# Patient Record
Sex: Male | Born: 1945
Health system: Southern US, Community
[De-identification: ages and names within clinical notes are randomized; demographics above are authoritative.]

## PROBLEM LIST (undated history)

## (undated) DIAGNOSIS — Z8701 Personal history of pneumonia (recurrent): Secondary | ICD-10-CM

## (undated) DIAGNOSIS — Z9889 Other specified postprocedural states: Secondary | ICD-10-CM

## (undated) DIAGNOSIS — D689 Coagulation defect, unspecified: Secondary | ICD-10-CM

## (undated) DIAGNOSIS — R51 Headache: Secondary | ICD-10-CM

## (undated) DIAGNOSIS — E785 Hyperlipidemia, unspecified: Secondary | ICD-10-CM

## (undated) DIAGNOSIS — N400 Enlarged prostate without lower urinary tract symptoms: Secondary | ICD-10-CM

## (undated) DIAGNOSIS — R233 Spontaneous ecchymoses: Secondary | ICD-10-CM

## (undated) DIAGNOSIS — M2540 Effusion, unspecified joint: Secondary | ICD-10-CM

## (undated) DIAGNOSIS — E790 Hyperuricemia without signs of inflammatory arthritis and tophaceous disease: Secondary | ICD-10-CM

## (undated) DIAGNOSIS — IMO0002 Reserved for concepts with insufficient information to code with codable children: Secondary | ICD-10-CM

## (undated) DIAGNOSIS — I480 Paroxysmal atrial fibrillation: Secondary | ICD-10-CM

## (undated) DIAGNOSIS — K219 Gastro-esophageal reflux disease without esophagitis: Secondary | ICD-10-CM

## (undated) DIAGNOSIS — I509 Heart failure, unspecified: Secondary | ICD-10-CM

## (undated) DIAGNOSIS — M2550 Pain in unspecified joint: Secondary | ICD-10-CM

## (undated) DIAGNOSIS — J439 Emphysema, unspecified: Secondary | ICD-10-CM

## (undated) DIAGNOSIS — Z77098 Contact with and (suspected) exposure to other hazardous, chiefly nonmedicinal, chemicals: Secondary | ICD-10-CM

## (undated) DIAGNOSIS — M810 Age-related osteoporosis without current pathological fracture: Secondary | ICD-10-CM

## (undated) DIAGNOSIS — J189 Pneumonia, unspecified organism: Secondary | ICD-10-CM

## (undated) DIAGNOSIS — I4891 Unspecified atrial fibrillation: Secondary | ICD-10-CM

## (undated) DIAGNOSIS — G629 Polyneuropathy, unspecified: Secondary | ICD-10-CM

## (undated) DIAGNOSIS — E079 Disorder of thyroid, unspecified: Secondary | ICD-10-CM

## (undated) DIAGNOSIS — J449 Chronic obstructive pulmonary disease, unspecified: Secondary | ICD-10-CM

## (undated) DIAGNOSIS — IMO0001 Reserved for inherently not codable concepts without codable children: Secondary | ICD-10-CM

## (undated) DIAGNOSIS — G709 Myoneural disorder, unspecified: Secondary | ICD-10-CM

## (undated) DIAGNOSIS — J42 Unspecified chronic bronchitis: Secondary | ICD-10-CM

## (undated) DIAGNOSIS — M1990 Unspecified osteoarthritis, unspecified site: Secondary | ICD-10-CM

## (undated) DIAGNOSIS — T7840XA Allergy, unspecified, initial encounter: Secondary | ICD-10-CM

## (undated) DIAGNOSIS — I499 Cardiac arrhythmia, unspecified: Secondary | ICD-10-CM

## (undated) DIAGNOSIS — Z86010 Personal history of colonic polyps: Secondary | ICD-10-CM

## (undated) DIAGNOSIS — E039 Hypothyroidism, unspecified: Secondary | ICD-10-CM

## (undated) DIAGNOSIS — R238 Other skin changes: Secondary | ICD-10-CM

## (undated) DIAGNOSIS — R519 Headache, unspecified: Secondary | ICD-10-CM

## (undated) DIAGNOSIS — K5792 Diverticulitis of intestine, part unspecified, without perforation or abscess without bleeding: Secondary | ICD-10-CM

## (undated) DIAGNOSIS — D649 Anemia, unspecified: Secondary | ICD-10-CM

## (undated) DIAGNOSIS — D72829 Elevated white blood cell count, unspecified: Secondary | ICD-10-CM

## (undated) DIAGNOSIS — Z8719 Personal history of other diseases of the digestive system: Secondary | ICD-10-CM

## (undated) DIAGNOSIS — H269 Unspecified cataract: Secondary | ICD-10-CM

## (undated) HISTORY — DX: Allergy, unspecified, initial encounter: T78.40XA

## (undated) HISTORY — PX: CHOLECYSTECTOMY: SHX55

## (undated) HISTORY — DX: Personal history of pneumonia (recurrent): Z87.01

## (undated) HISTORY — PX: ESOPHAGOGASTRODUODENOSCOPY: SHX1529

## (undated) HISTORY — PX: BACK SURGERY: SHX140

## (undated) HISTORY — DX: Elevated white blood cell count, unspecified: D72.829

## (undated) HISTORY — DX: Polyneuropathy, unspecified: G62.9

## (undated) HISTORY — PX: COLONOSCOPY: SHX174

## (undated) HISTORY — DX: Coagulation defect, unspecified: D68.9

## (undated) HISTORY — DX: Other specified postprocedural states: Z98.890

## (undated) HISTORY — DX: Age-related osteoporosis without current pathological fracture: M81.0

## (undated) HISTORY — PX: SPINE SURGERY: SHX786

## (undated) HISTORY — DX: Unspecified atrial fibrillation: I48.91

## (undated) HISTORY — DX: Myoneural disorder, unspecified: G70.9

## (undated) HISTORY — DX: Paroxysmal atrial fibrillation: I48.0

## (undated) HISTORY — DX: Reserved for concepts with insufficient information to code with codable children: IMO0002

## (undated) HISTORY — DX: Emphysema, unspecified: J43.9

## (undated) HISTORY — DX: Heart failure, unspecified: I50.9

## (undated) HISTORY — DX: Personal history of colonic polyps: Z86.010

## (undated) HISTORY — DX: Disorder of thyroid, unspecified: E07.9

## (undated) HISTORY — PX: FOOT SURGERY: SHX648

## (undated) HISTORY — PX: KNEE ARTHROSCOPY: SUR90

## (undated) HISTORY — DX: Unspecified cataract: H26.9

## (undated) HISTORY — PX: SHOULDER SURGERY: SHX246

## (undated) HISTORY — PX: JOINT REPLACEMENT: SHX530

## (undated) SURGERY — ARTHROPLASTY, HIP, TOTAL, ANTERIOR APPROACH
Anesthesia: Choice | Laterality: Right

---
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
## 1969-01-25 DIAGNOSIS — Z77098 Contact with and (suspected) exposure to other hazardous, chiefly nonmedicinal, chemicals: Secondary | ICD-10-CM

## 1969-01-25 HISTORY — DX: Contact with and (suspected) exposure to other hazardous, chiefly nonmedicinal, chemicals: Z77.098

## 2006-07-11 ENCOUNTER — Ambulatory Visit (HOSPITAL_BASED_OUTPATIENT_CLINIC_OR_DEPARTMENT_OTHER): Admission: RE | Admit: 2006-07-11 | Discharge: 2006-07-11 | Payer: Self-pay | Admitting: Orthopedic Surgery

## 2006-07-12 ENCOUNTER — Ambulatory Visit (HOSPITAL_COMMUNITY): Admission: RE | Admit: 2006-07-12 | Discharge: 2006-07-12 | Payer: Self-pay | Admitting: *Deleted

## 2006-07-12 ENCOUNTER — Ambulatory Visit: Payer: Self-pay | Admitting: Vascular Surgery

## 2007-11-05 ENCOUNTER — Ambulatory Visit: Payer: Self-pay | Admitting: Pulmonary Disease

## 2007-11-05 DIAGNOSIS — J209 Acute bronchitis, unspecified: Secondary | ICD-10-CM | POA: Insufficient documentation

## 2007-11-09 ENCOUNTER — Telehealth (INDEPENDENT_AMBULATORY_CARE_PROVIDER_SITE_OTHER): Payer: Self-pay | Admitting: *Deleted

## 2007-11-12 ENCOUNTER — Ambulatory Visit: Payer: Self-pay | Admitting: Internal Medicine

## 2007-11-13 ENCOUNTER — Ambulatory Visit: Payer: Self-pay | Admitting: Internal Medicine

## 2007-11-13 ENCOUNTER — Telehealth (INDEPENDENT_AMBULATORY_CARE_PROVIDER_SITE_OTHER): Payer: Self-pay | Admitting: *Deleted

## 2007-11-30 ENCOUNTER — Ambulatory Visit: Payer: Self-pay | Admitting: Internal Medicine

## 2007-11-30 DIAGNOSIS — R059 Cough, unspecified: Secondary | ICD-10-CM | POA: Insufficient documentation

## 2007-11-30 DIAGNOSIS — R05 Cough: Secondary | ICD-10-CM

## 2007-12-01 ENCOUNTER — Ambulatory Visit: Payer: Self-pay | Admitting: Cardiovascular Disease

## 2007-12-02 ENCOUNTER — Telehealth: Payer: Self-pay | Admitting: Internal Medicine

## 2007-12-14 ENCOUNTER — Emergency Department (HOSPITAL_COMMUNITY): Admission: EM | Admit: 2007-12-14 | Discharge: 2007-12-14 | Payer: Self-pay | Admitting: Family Medicine

## 2007-12-14 ENCOUNTER — Ambulatory Visit: Payer: Self-pay | Admitting: Internal Medicine

## 2007-12-14 DIAGNOSIS — J45909 Unspecified asthma, uncomplicated: Secondary | ICD-10-CM | POA: Insufficient documentation

## 2007-12-18 ENCOUNTER — Telehealth (INDEPENDENT_AMBULATORY_CARE_PROVIDER_SITE_OTHER): Payer: Self-pay | Admitting: *Deleted

## 2007-12-23 ENCOUNTER — Encounter: Payer: Self-pay | Admitting: Pulmonary Disease

## 2008-01-04 ENCOUNTER — Encounter: Payer: Self-pay | Admitting: Pulmonary Disease

## 2008-01-29 ENCOUNTER — Encounter: Payer: Self-pay | Admitting: Internal Medicine

## 2008-01-29 ENCOUNTER — Encounter: Payer: Self-pay | Admitting: Pulmonary Disease

## 2008-01-30 ENCOUNTER — Emergency Department (HOSPITAL_COMMUNITY): Admission: EM | Admit: 2008-01-30 | Discharge: 2008-01-30 | Payer: Self-pay | Admitting: Emergency Medicine

## 2008-01-31 ENCOUNTER — Emergency Department (HOSPITAL_COMMUNITY): Admission: EM | Admit: 2008-01-31 | Discharge: 2008-02-01 | Payer: Self-pay | Admitting: Emergency Medicine

## 2008-02-05 ENCOUNTER — Encounter: Payer: Self-pay | Admitting: Pulmonary Disease

## 2008-02-09 ENCOUNTER — Encounter: Payer: Self-pay | Admitting: Pulmonary Disease

## 2008-02-19 ENCOUNTER — Encounter: Payer: Self-pay | Admitting: Pulmonary Disease

## 2008-03-23 ENCOUNTER — Encounter: Payer: Self-pay | Admitting: Pulmonary Disease

## 2008-06-08 ENCOUNTER — Encounter: Payer: Self-pay | Admitting: Pulmonary Disease

## 2008-09-06 ENCOUNTER — Encounter: Payer: Self-pay | Admitting: Pulmonary Disease

## 2008-10-16 ENCOUNTER — Emergency Department (HOSPITAL_BASED_OUTPATIENT_CLINIC_OR_DEPARTMENT_OTHER): Admission: EM | Admit: 2008-10-16 | Discharge: 2008-10-16 | Payer: Self-pay | Admitting: Emergency Medicine

## 2008-10-20 ENCOUNTER — Telehealth (INDEPENDENT_AMBULATORY_CARE_PROVIDER_SITE_OTHER): Payer: Self-pay | Admitting: *Deleted

## 2008-11-01 ENCOUNTER — Ambulatory Visit: Payer: Self-pay | Admitting: Pulmonary Disease

## 2008-11-01 DIAGNOSIS — B4481 Allergic bronchopulmonary aspergillosis: Secondary | ICD-10-CM | POA: Insufficient documentation

## 2008-11-02 ENCOUNTER — Encounter: Payer: Self-pay | Admitting: Pulmonary Disease

## 2008-11-09 ENCOUNTER — Telehealth: Payer: Self-pay | Admitting: Pulmonary Disease

## 2008-11-10 ENCOUNTER — Ambulatory Visit: Payer: Self-pay | Admitting: Emergency Medicine

## 2008-11-10 ENCOUNTER — Telehealth (INDEPENDENT_AMBULATORY_CARE_PROVIDER_SITE_OTHER): Payer: Self-pay | Admitting: *Deleted

## 2008-11-11 LAB — CONVERTED CEMR LAB
BUN: 21 mg/dL (ref 6–23)
CO2: 32 meq/L (ref 19–32)
Calcium: 10.1 mg/dL (ref 8.4–10.5)
Chloride: 105 meq/L (ref 96–112)
Creatinine, Ser: 1.1 mg/dL (ref 0.4–1.5)
GFR calc non Af Amer: 72.19 mL/min (ref >60)
Glucose, Bld: 111 mg/dL — ABNORMAL HIGH (ref 70–99)
Phosphorus: 3.4 mg/dL (ref 2.3–4.6)
Potassium: 4.8 meq/L (ref 3.5–5.1)
Sodium: 143 meq/L (ref 135–145)
Total CK: 50 units/L (ref 7–232)

## 2008-11-15 ENCOUNTER — Ambulatory Visit: Payer: Self-pay | Admitting: Pulmonary Disease

## 2008-11-15 DIAGNOSIS — R0789 Other chest pain: Secondary | ICD-10-CM | POA: Insufficient documentation

## 2008-11-17 ENCOUNTER — Encounter: Payer: Self-pay | Admitting: Pulmonary Disease

## 2008-11-20 ENCOUNTER — Emergency Department (HOSPITAL_BASED_OUTPATIENT_CLINIC_OR_DEPARTMENT_OTHER): Admission: EM | Admit: 2008-11-20 | Discharge: 2008-11-20 | Payer: Self-pay | Admitting: Emergency Medicine

## 2008-11-21 ENCOUNTER — Telehealth (INDEPENDENT_AMBULATORY_CARE_PROVIDER_SITE_OTHER): Payer: Self-pay | Admitting: *Deleted

## 2008-11-22 ENCOUNTER — Telehealth: Payer: Self-pay | Admitting: Pulmonary Disease

## 2008-11-24 ENCOUNTER — Telehealth: Payer: Self-pay | Admitting: Pulmonary Disease

## 2008-11-24 HISTORY — PX: CARDIAC CATHETERIZATION: SHX172

## 2008-11-25 ENCOUNTER — Ambulatory Visit (HOSPITAL_COMMUNITY): Admission: RE | Admit: 2008-11-25 | Discharge: 2008-11-25 | Payer: Self-pay | Admitting: Cardiology

## 2008-11-26 ENCOUNTER — Encounter: Payer: Self-pay | Admitting: Pulmonary Disease

## 2008-11-30 ENCOUNTER — Ambulatory Visit: Payer: Self-pay | Admitting: Pulmonary Disease

## 2008-12-04 ENCOUNTER — Telehealth: Payer: Self-pay | Admitting: Pulmonary Disease

## 2008-12-12 ENCOUNTER — Encounter: Admission: RE | Admit: 2008-12-12 | Discharge: 2008-12-12 | Payer: Self-pay | Admitting: Cardiology

## 2008-12-21 ENCOUNTER — Telehealth: Payer: Self-pay | Admitting: Pulmonary Disease

## 2008-12-22 ENCOUNTER — Telehealth: Payer: Self-pay | Admitting: Pulmonary Disease

## 2008-12-22 ENCOUNTER — Ambulatory Visit: Payer: Self-pay | Admitting: Internal Medicine

## 2009-01-16 ENCOUNTER — Ambulatory Visit: Payer: Self-pay | Admitting: Pulmonary Disease

## 2009-02-10 ENCOUNTER — Ambulatory Visit: Payer: Self-pay | Admitting: Pulmonary Disease

## 2009-03-06 ENCOUNTER — Ambulatory Visit: Payer: Self-pay | Admitting: Internal Medicine

## 2009-03-06 ENCOUNTER — Telehealth (INDEPENDENT_AMBULATORY_CARE_PROVIDER_SITE_OTHER): Payer: Self-pay | Admitting: *Deleted

## 2009-03-06 DIAGNOSIS — J4530 Mild persistent asthma, uncomplicated: Secondary | ICD-10-CM

## 2009-03-06 DIAGNOSIS — J4540 Moderate persistent asthma, uncomplicated: Secondary | ICD-10-CM | POA: Insufficient documentation

## 2009-03-14 ENCOUNTER — Emergency Department (HOSPITAL_COMMUNITY): Admission: EM | Admit: 2009-03-14 | Discharge: 2009-03-14 | Payer: Self-pay | Admitting: Emergency Medicine

## 2009-03-20 ENCOUNTER — Telehealth: Payer: Self-pay | Admitting: Pulmonary Disease

## 2009-03-23 ENCOUNTER — Telehealth: Payer: Self-pay | Admitting: Pulmonary Disease

## 2009-03-28 ENCOUNTER — Encounter: Payer: Self-pay | Admitting: Pulmonary Disease

## 2009-03-29 ENCOUNTER — Telehealth (INDEPENDENT_AMBULATORY_CARE_PROVIDER_SITE_OTHER): Payer: Self-pay | Admitting: *Deleted

## 2009-03-29 ENCOUNTER — Telehealth: Payer: Self-pay | Admitting: Pulmonary Disease

## 2009-03-29 ENCOUNTER — Ambulatory Visit: Payer: Self-pay | Admitting: Internal Medicine

## 2009-04-04 ENCOUNTER — Telehealth: Payer: Self-pay | Admitting: Pulmonary Disease

## 2009-04-17 ENCOUNTER — Telehealth: Payer: Self-pay | Admitting: Internal Medicine

## 2009-04-17 ENCOUNTER — Ambulatory Visit: Payer: Self-pay | Admitting: Pulmonary Disease

## 2009-04-18 ENCOUNTER — Ambulatory Visit: Payer: Self-pay | Admitting: Internal Medicine

## 2009-04-28 ENCOUNTER — Telehealth: Payer: Self-pay | Admitting: Internal Medicine

## 2009-05-01 ENCOUNTER — Ambulatory Visit: Payer: Self-pay | Admitting: Internal Medicine

## 2009-05-04 ENCOUNTER — Ambulatory Visit: Payer: Self-pay | Admitting: Internal Medicine

## 2009-05-05 ENCOUNTER — Telehealth (INDEPENDENT_AMBULATORY_CARE_PROVIDER_SITE_OTHER): Payer: Self-pay | Admitting: *Deleted

## 2009-05-08 ENCOUNTER — Ambulatory Visit: Payer: Self-pay | Admitting: Internal Medicine

## 2009-05-12 ENCOUNTER — Ambulatory Visit: Payer: Self-pay | Admitting: Internal Medicine

## 2009-05-15 ENCOUNTER — Ambulatory Visit: Payer: Self-pay | Admitting: Internal Medicine

## 2009-05-18 ENCOUNTER — Ambulatory Visit: Payer: Self-pay | Admitting: Internal Medicine

## 2009-05-22 ENCOUNTER — Ambulatory Visit: Payer: Self-pay | Admitting: Internal Medicine

## 2009-05-25 ENCOUNTER — Ambulatory Visit: Payer: Self-pay | Admitting: Internal Medicine

## 2009-05-27 HISTORY — PX: TOTAL HIP ARTHROPLASTY: SHX124

## 2009-05-27 HISTORY — PX: EYE SURGERY: SHX253

## 2009-05-29 ENCOUNTER — Ambulatory Visit: Payer: Self-pay | Admitting: Internal Medicine

## 2009-06-01 ENCOUNTER — Ambulatory Visit: Payer: Self-pay | Admitting: Internal Medicine

## 2009-06-05 ENCOUNTER — Encounter: Payer: Self-pay | Admitting: Pulmonary Disease

## 2009-06-05 ENCOUNTER — Ambulatory Visit: Payer: Self-pay | Admitting: Internal Medicine

## 2009-06-08 ENCOUNTER — Ambulatory Visit: Payer: Self-pay | Admitting: Internal Medicine

## 2009-06-13 ENCOUNTER — Ambulatory Visit: Payer: Self-pay | Admitting: Pulmonary Disease

## 2009-06-13 ENCOUNTER — Ambulatory Visit: Payer: Self-pay | Admitting: Internal Medicine

## 2009-06-16 ENCOUNTER — Ambulatory Visit: Payer: Self-pay | Admitting: Internal Medicine

## 2009-06-19 ENCOUNTER — Ambulatory Visit: Payer: Self-pay | Admitting: Internal Medicine

## 2009-06-22 ENCOUNTER — Ambulatory Visit: Payer: Self-pay | Admitting: Internal Medicine

## 2009-06-23 ENCOUNTER — Emergency Department (HOSPITAL_BASED_OUTPATIENT_CLINIC_OR_DEPARTMENT_OTHER): Admission: EM | Admit: 2009-06-23 | Discharge: 2009-06-23 | Payer: Self-pay | Admitting: Emergency Medicine

## 2009-06-23 ENCOUNTER — Ambulatory Visit: Payer: Self-pay | Admitting: Radiology

## 2009-06-26 ENCOUNTER — Ambulatory Visit: Payer: Self-pay | Admitting: Internal Medicine

## 2009-06-28 ENCOUNTER — Ambulatory Visit (HOSPITAL_COMMUNITY): Admission: RE | Admit: 2009-06-28 | Discharge: 2009-06-28 | Payer: Self-pay | Admitting: *Deleted

## 2009-06-28 ENCOUNTER — Ambulatory Visit: Payer: Self-pay | Admitting: Internal Medicine

## 2009-06-29 ENCOUNTER — Ambulatory Visit: Payer: Self-pay | Admitting: Internal Medicine

## 2009-06-30 ENCOUNTER — Ambulatory Visit: Payer: Self-pay | Admitting: Internal Medicine

## 2009-07-03 ENCOUNTER — Ambulatory Visit: Payer: Self-pay | Admitting: Internal Medicine

## 2009-07-05 ENCOUNTER — Telehealth (INDEPENDENT_AMBULATORY_CARE_PROVIDER_SITE_OTHER): Payer: Self-pay | Admitting: *Deleted

## 2009-07-05 ENCOUNTER — Ambulatory Visit: Payer: Self-pay | Admitting: Pulmonary Disease

## 2009-07-05 DIAGNOSIS — J069 Acute upper respiratory infection, unspecified: Secondary | ICD-10-CM | POA: Insufficient documentation

## 2009-07-06 ENCOUNTER — Ambulatory Visit: Payer: Self-pay | Admitting: Internal Medicine

## 2009-07-10 ENCOUNTER — Ambulatory Visit: Payer: Self-pay | Admitting: Internal Medicine

## 2009-07-13 ENCOUNTER — Ambulatory Visit: Payer: Self-pay | Admitting: Internal Medicine

## 2009-07-17 ENCOUNTER — Ambulatory Visit: Payer: Self-pay | Admitting: Internal Medicine

## 2009-07-20 ENCOUNTER — Ambulatory Visit: Payer: Self-pay | Admitting: Internal Medicine

## 2009-07-24 ENCOUNTER — Ambulatory Visit: Payer: Self-pay | Admitting: Internal Medicine

## 2009-07-27 ENCOUNTER — Ambulatory Visit: Payer: Self-pay | Admitting: Internal Medicine

## 2009-07-31 ENCOUNTER — Ambulatory Visit: Payer: Self-pay | Admitting: Internal Medicine

## 2009-08-03 ENCOUNTER — Ambulatory Visit: Payer: Self-pay | Admitting: Internal Medicine

## 2009-08-07 ENCOUNTER — Ambulatory Visit: Payer: Self-pay | Admitting: Internal Medicine

## 2009-08-10 ENCOUNTER — Ambulatory Visit: Payer: Self-pay | Admitting: Internal Medicine

## 2009-08-11 ENCOUNTER — Ambulatory Visit: Payer: Self-pay | Admitting: Internal Medicine

## 2009-08-14 ENCOUNTER — Ambulatory Visit: Payer: Self-pay | Admitting: Internal Medicine

## 2009-08-17 ENCOUNTER — Ambulatory Visit: Payer: Self-pay | Admitting: Internal Medicine

## 2009-08-21 ENCOUNTER — Ambulatory Visit: Payer: Self-pay | Admitting: Internal Medicine

## 2009-08-24 ENCOUNTER — Ambulatory Visit: Payer: Self-pay | Admitting: Internal Medicine

## 2009-08-28 ENCOUNTER — Ambulatory Visit: Payer: Self-pay | Admitting: Internal Medicine

## 2009-09-04 ENCOUNTER — Ambulatory Visit: Payer: Self-pay | Admitting: Internal Medicine

## 2009-09-11 ENCOUNTER — Ambulatory Visit: Payer: Self-pay | Admitting: Internal Medicine

## 2009-09-18 ENCOUNTER — Ambulatory Visit: Payer: Self-pay | Admitting: Internal Medicine

## 2009-09-25 ENCOUNTER — Ambulatory Visit: Payer: Self-pay | Admitting: Internal Medicine

## 2009-10-02 ENCOUNTER — Ambulatory Visit: Payer: Self-pay | Admitting: Internal Medicine

## 2009-10-09 ENCOUNTER — Ambulatory Visit: Payer: Self-pay | Admitting: Internal Medicine

## 2009-10-11 ENCOUNTER — Ambulatory Visit: Payer: Self-pay | Admitting: Pulmonary Disease

## 2009-10-16 ENCOUNTER — Ambulatory Visit: Payer: Self-pay | Admitting: Internal Medicine

## 2009-10-24 ENCOUNTER — Ambulatory Visit: Payer: Self-pay | Admitting: Internal Medicine

## 2009-10-30 ENCOUNTER — Ambulatory Visit: Payer: Self-pay | Admitting: Internal Medicine

## 2009-11-06 ENCOUNTER — Ambulatory Visit: Payer: Self-pay | Admitting: Internal Medicine

## 2009-11-13 ENCOUNTER — Ambulatory Visit: Payer: Self-pay | Admitting: Internal Medicine

## 2009-11-20 ENCOUNTER — Ambulatory Visit: Payer: Self-pay | Admitting: Internal Medicine

## 2009-11-28 ENCOUNTER — Ambulatory Visit: Payer: Self-pay | Admitting: Internal Medicine

## 2009-12-04 ENCOUNTER — Ambulatory Visit: Payer: Self-pay | Admitting: Internal Medicine

## 2009-12-11 ENCOUNTER — Ambulatory Visit: Payer: Self-pay | Admitting: Internal Medicine

## 2009-12-12 ENCOUNTER — Ambulatory Visit: Payer: Self-pay | Admitting: Internal Medicine

## 2009-12-18 ENCOUNTER — Ambulatory Visit: Payer: Self-pay | Admitting: Internal Medicine

## 2009-12-25 ENCOUNTER — Ambulatory Visit: Payer: Self-pay | Admitting: Internal Medicine

## 2009-12-25 ENCOUNTER — Encounter: Payer: Self-pay | Admitting: Internal Medicine

## 2010-01-01 ENCOUNTER — Ambulatory Visit: Payer: Self-pay | Admitting: Internal Medicine

## 2010-01-04 ENCOUNTER — Telehealth: Payer: Self-pay | Admitting: Pulmonary Disease

## 2010-01-08 ENCOUNTER — Ambulatory Visit: Payer: Self-pay | Admitting: Internal Medicine

## 2010-01-15 ENCOUNTER — Ambulatory Visit: Payer: Self-pay | Admitting: Internal Medicine

## 2010-01-22 ENCOUNTER — Ambulatory Visit: Payer: Self-pay | Admitting: Internal Medicine

## 2010-01-30 ENCOUNTER — Ambulatory Visit: Payer: Self-pay | Admitting: Internal Medicine

## 2010-02-05 ENCOUNTER — Ambulatory Visit: Payer: Self-pay | Admitting: Internal Medicine

## 2010-02-06 ENCOUNTER — Telehealth (INDEPENDENT_AMBULATORY_CARE_PROVIDER_SITE_OTHER): Payer: Self-pay | Admitting: *Deleted

## 2010-02-07 ENCOUNTER — Ambulatory Visit: Payer: Self-pay | Admitting: Pulmonary Disease

## 2010-02-07 DIAGNOSIS — J45901 Unspecified asthma with (acute) exacerbation: Secondary | ICD-10-CM | POA: Insufficient documentation

## 2010-02-12 ENCOUNTER — Ambulatory Visit: Payer: Self-pay | Admitting: Internal Medicine

## 2010-02-19 ENCOUNTER — Ambulatory Visit: Payer: Self-pay | Admitting: Internal Medicine

## 2010-02-21 ENCOUNTER — Inpatient Hospital Stay (HOSPITAL_COMMUNITY): Admission: RE | Admit: 2010-02-21 | Discharge: 2010-02-23 | Payer: Self-pay | Admitting: Orthopedic Surgery

## 2010-02-27 ENCOUNTER — Ambulatory Visit: Payer: Self-pay | Admitting: Internal Medicine

## 2010-03-06 ENCOUNTER — Ambulatory Visit: Payer: Self-pay | Admitting: Internal Medicine

## 2010-03-13 ENCOUNTER — Ambulatory Visit: Payer: Self-pay | Admitting: Internal Medicine

## 2010-03-20 ENCOUNTER — Ambulatory Visit: Payer: Self-pay | Admitting: Internal Medicine

## 2010-03-26 ENCOUNTER — Ambulatory Visit: Payer: Self-pay | Admitting: Internal Medicine

## 2010-04-02 ENCOUNTER — Telehealth (INDEPENDENT_AMBULATORY_CARE_PROVIDER_SITE_OTHER): Payer: Self-pay | Admitting: *Deleted

## 2010-04-02 ENCOUNTER — Ambulatory Visit: Payer: Self-pay | Admitting: Internal Medicine

## 2010-04-02 ENCOUNTER — Ambulatory Visit: Payer: Self-pay | Admitting: Pulmonary Disease

## 2010-04-03 ENCOUNTER — Ambulatory Visit: Payer: Self-pay | Admitting: Internal Medicine

## 2010-04-09 ENCOUNTER — Ambulatory Visit: Payer: Self-pay | Admitting: Internal Medicine

## 2010-04-17 ENCOUNTER — Ambulatory Visit: Payer: Self-pay | Admitting: Internal Medicine

## 2010-04-23 ENCOUNTER — Ambulatory Visit: Payer: Self-pay | Admitting: Internal Medicine

## 2010-04-24 ENCOUNTER — Ambulatory Visit: Payer: Self-pay | Admitting: Pulmonary Disease

## 2010-04-24 DIAGNOSIS — R042 Hemoptysis: Secondary | ICD-10-CM | POA: Insufficient documentation

## 2010-04-26 ENCOUNTER — Encounter: Admission: RE | Admit: 2010-04-26 | Discharge: 2010-04-26 | Payer: Self-pay | Admitting: Orthopedic Surgery

## 2010-04-30 ENCOUNTER — Ambulatory Visit: Payer: Self-pay | Admitting: Internal Medicine

## 2010-04-30 ENCOUNTER — Ambulatory Visit: Payer: Self-pay | Admitting: Pulmonary Disease

## 2010-05-07 ENCOUNTER — Ambulatory Visit: Payer: Self-pay | Admitting: Internal Medicine

## 2010-05-14 ENCOUNTER — Ambulatory Visit: Payer: Self-pay | Admitting: Internal Medicine

## 2010-05-24 ENCOUNTER — Ambulatory Visit: Payer: Self-pay | Admitting: Internal Medicine

## 2010-05-27 HISTORY — PX: LATERAL / POSTERIOR COMBINED FUSION LUMBAR SPINE: SUR300

## 2010-06-09 ENCOUNTER — Ambulatory Visit: Payer: Self-pay | Admitting: Internal Medicine

## 2010-06-14 ENCOUNTER — Ambulatory Visit: Payer: Self-pay | Admitting: Internal Medicine

## 2010-06-21 ENCOUNTER — Ambulatory Visit: Payer: Self-pay | Admitting: Internal Medicine

## 2010-06-25 ENCOUNTER — Ambulatory Visit: Payer: Self-pay | Admitting: Internal Medicine

## 2010-06-25 ENCOUNTER — Ambulatory Visit
Admission: RE | Admit: 2010-06-25 | Discharge: 2010-06-25 | Payer: Self-pay | Source: Home / Self Care | Attending: Internal Medicine | Admitting: Internal Medicine

## 2010-06-25 DIAGNOSIS — R21 Rash and other nonspecific skin eruption: Secondary | ICD-10-CM | POA: Insufficient documentation

## 2010-06-26 NOTE — Assessment & Plan Note (Signed)
Summary: rov for asthma and acute bronchitis   Visit Type:  Follow-up Copy to:  [facility] Primary Provider/Referring Provider:  [name]  CC:  follow up. pt c/o chest congestion and tightness, wheezing, and cough with green-brown phlem. Pt states he has had a little increased sob. pt is never smoker. pt needs rx for maxair. [name]  History of Present Illness: the pt comes in today for f/u of his known asthma.  He his 4-5 weeks out from [facility], and did well with the surgery.  At the last visit in mid Sept, he was felt to have possible sinobronchitis.  He was given a prescription for an antibiotic, but unfortunately he forgot to fill.  He has continued to have some chest congestion with cough and purulent mucus at times, but does not feel that it is affecting his breathing that much.    Current Medications (verified): 1)  Allopurinol 100 Mg  Tabs (Allopurinol) .... Once Daily 2)  Protonix 40 Mg  Tbec (Pantoprazole Sodium) .... Take 1 Tablet By Mouth Two Times A Day 3)  Lipitor 10 Mg  Tabs (Atorvastatin Calcium) .... Take 1 Tablet By Mouth Once A Day 4)  Mucinex Dm Maximum Strength 60-1200 Mg  Tb12 (Dextromethorphan-Guaifenesin) .[name].. 1 Every 12 Hours 5)  Maxair Autohaler 200 Mcg/inh Aerb (Pirbuterol Acetate) .... Inhale 2 Puffs Every 6 Hours As Needed 6)  Qvar 80 Mcg/act Aers (Beclomethasone Dipropionate) .... Inhale 2 Puffs Two Times A Day 7)  Foradil Aerolizer 12 Mcg Caps (Formoterol Fumarate) .... Inhale Contents of 1 Cap Two Times A Day 8)  Allergy Vaccine Gh .... Adv To 1:10 Next Order 9)  Prednisone 5 Mg Tabs (Prednisone) .... Once Daily  Allergies (verified): 1)  ! Levaquin  Past History:  Past medical, surgical, family and social histories (including risk factors) reviewed, and no changes noted (except as noted below).  Past Medical History: Reviewed history from 12/25/2009 and no changes required. severe asthma- allergy vaccine 2011  Past Surgical History: Reviewed history  from 11/05/2007 and no changes required. 3 knee surgeries 2 foot surgeries gallbladder -1995  Family History: Reviewed history from 11/01/2008 and no changes required. neg atopy or resp dz  heart disease: father cancer: brother (prostate)   Social History: Reviewed history from 11/01/2008 and no changes required. 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    Review of Systems       The patient complains of shortness of breath with activity, productive cough, difficulty swallowing, sore throat, nasal congestion/difficulty breathing through nose, hand/feet swelling, joint stiffness or pain, and change in color of mucus.  The patient denies shortness of breath at rest, non-productive cough, coughing up blood, chest pain, irregular heartbeats, acid heartburn, indigestion, loss of appetite, weight change, abdominal pain, tooth/dental problems, headaches, sneezing, itching, ear ache, anxiety, depression, rash, and fever.    Vital Signs:  Patient profile:   65 year old male Height:      74 inches Weight:      156 pounds BMI:     20.10 O2 Sat:      97 % on Room air Temp:     98.0 degrees F oral Pulse rate:   90 / minute BP sitting:   102 / 68  (left arm) Cuff size:   regular  Vitals Entered By: Carver Fila (April 02, 2010 9:51 AM)  O2 Flow:  Room air CC: follow up. pt c/o chest congestion and tightness, wheezing, cough with green-brown phlem. Pt states he has had a little increased sob. pt is never smoker. pt needs rx for maxair.  Comments meds and allergies updated Phone number updated Carver Fila  April 02, 2010 9:52 AM    Physical Exam  General:  thin male in nad Nose:  no discharge or purulence noted. Mouth:  clear without exudates. Lungs:  clear to auscultation.  No wheezing or rhonchi Heart:  rrr, no mrg Extremities:  no  edema or cyanosis  Neurologic:  alert and oriented, moves all 4.   Impression & Recommendations:  Problem # 1:  INTRINSIC ASTHMA, UNSPECIFIED (ICD-493.10) the pt is doing well from an asthma standpoint with current meds, low dose prednisone, and allergy vaccine.  ?try to come off prednisone next visit?  He is to continue with his current meds.  Problem # 2:  BRONCHITIS, ACUTE (ICD-466.0)  the pt never filled his abx last visit, and I felt he had acute bronchitis at that time. He has continued to have a cough with mildly purulent mucus, and feels that his chest has never returned to baseline.  Will go ahead and treat him with a short course of abx, and see if things improve.  Medications Added to Medication List This Visit: 1)  Prednisone 5 Mg Tabs (Prednisone) .... Once daily 2)  Cefdinir 300 Mg Caps (Cefdinir) .... 2 each am for 5 days.  Other Orders: Est. Patient Level IV (04540)  Patient Instructions: 1)  will treat you with a short course of antibiotics since you are having discolored mucus.  please call me if not getting better. 2)  no change in maintenance meds. 3)  followup with me in 4mos.   Prescriptions: MAXAIR AUTOHALER 200 MCG/INH AERB (PIRBUTEROL ACETATE) inhale 2 puffs every 6 hours as needed  #1 x 6   Entered and Authorized by:   [name] MD   Signed by:   [name] MD on 04/02/2010   Method used:   Print then Give to Patient   RxID:   [id number] CEFDINIR 300 MG CAPS (CEFDINIR) 2 each am for 5 days.  #10 x 0   Entered and Authorized by:   [name] MD   Signed by:   [name] MD on 04/02/2010   Method used:   Print then Give to Patient   RxID:   [id number]    Immunization History:  Influenza Immunization History:    Influenza:  historical (01/25/2010)

## 2010-06-26 NOTE — Letter (Signed)
Summary: NP-Dyspnea/[facility]  NP-Dyspnea/[facility]   Imported By: Sherian Rein 11/09/2008 10:07:43  _____________________________________________________________________  External Attachment:    Type:   Image     Comment:   External Document

## 2010-06-26 NOTE — Progress Notes (Signed)
Summary: diff breathing/ wants pt to be seen asap-pt in labs now  Phone Note Call from Patient   Caller: Spouse Call For: clance Summary of Call: pt's spouse wants to speak to nurse asap re: pt's difficulty breathing. says she spoke to [facility] yesterday. pt is downstairs doing labs and spouse feels that pt should be seen here by any doctor asap. call Bennye Alm at [phone] Initial call taken by: Tivis Ringer,  November 10, 2008 9:49 AM  Follow-up for Phone Call        Attempted to call back.  Line busy.  Will try again later. Abigail Miyamoto RN  November 10, 2008 10:29 AM   called and spoke with pt's wife.  Wife stated since pt was told to D/C Advair by RB due to "body cramps"  pt was c/o increased sob this morning due to the heat and weather outside.  However, wife states pt was feeling better this afternoon.  Offered pt an appt.  Wife declined stating since he was feeling better she just wanted this message forwarded to [facility] for him to be aware when he returns to the office on Monday.  I advised wife that we do have an on-call doc and she can take pt to the ED if dyspnea worsens and pt needs immediate attention.  wife verbalized understanding and denied any questions.  Arman Filter LPN  November 10, 2008 4:32 PM   Additional Follow-up for Phone Call Additional follow up Details #1::        please see if pt will come by office for 2 boxes of samples for symbicort  160/4.5  2 puffs am and pm.  pt has appt today with KC at 9:15am.  Will address concerns and questions at ov.  Aundra Millet Reynolds LPN  November 15, 2008 9:45 AM  Additional Follow-up by: [name] MD,  November 14, 2008 5:46 PM

## 2010-06-26 NOTE — Progress Notes (Signed)
Summary: WHEEZING  Phone Note Call from Patient   Caller: Patient Call For: [facility] Summary of Call: WHEEZING [facility]GH PHARMACY [facility] Initial call taken by: Rickard Patience,  July 05, 2009 8:45 AM  Follow-up for Phone Call        called, spoke with pt.  Pt c/o sneezing, runny nose, and wheezing x 2days.  states peak flow meter readings are also down.  Normally are aroung 500 but have been down to 390 now.  Pt ok to  come in today to see KC at 12:00-ov scheduled-pt aware.   Follow-up by: Gweneth Dimitri RN,  July 05, 2009 9:21 AM

## 2010-06-26 NOTE — Assessment & Plan Note (Signed)
Summary: consult for asthma, pulm clearance.   Copy to:  Jodi Geralds Primary Provider/Referring Provider:  [name]  CC:  Pulmonary Consult.  History of Present Illness: The pt is a 65y/o male who I have been asked to see for preop clearance for upcoming orthopedic surgery.  He was initially seen in this office for chronic cough which failed to respond to suppression regimens, treatment for reflux, and a trial of short acting bronchodilators.  It would typically respond to a course of prednisone.  HIs spirometry here was normal, and ct sinuses without acute or chronic infection.  In the interim, the pt has seen a pulmonologist in [place], [place].  The pt's symptoms have clearly progressed with worsening doe and cough, and he has had an extensive pulmonary workup for this.  Follow up pfts reveal moderate obstruction, his IgE level has been found to be greater than 300, his cbc showed 19% eosinophils, and his RAST showed multiple reactions with one of the highest to aspergillus.  The pt has also seen GI and had an unremarkable upper endo, and his A1AT level was normal.  He has since been started on a combination LABA/ICS, but has also required repeated prednisone pulses and basically is prednisone dependent at this point.  He has also been on high dose ICS as well.  Despite this, the pt had flare up 2 weeks ago which required ER visit and is now on prednisone again.  He currently is slowly returning to baseline.  Cough is currently not an issue, and he is trying to maintain his running.  He has not had a recent sinus infection.  Recent ct chest unremarkable.  Preventive Screening-Counseling & Management  Alcohol-Tobacco     Smoking Status: quit  Current Medications (verified): 1)  Advair Diskus 500-50 Mcg/dose  Misc (Fluticasone-Salmeterol) .... One Puff Twice Daily 2)  Allopurinol 100 Mg  Tabs (Allopurinol) .... Once Daily 3)  Protonix 40 Mg  Tbec (Pantoprazole Sodium) .... Take 1 Tablet By Mouth Two  Times A Day 4)  Lipitor 10 Mg  Tabs (Atorvastatin Calcium) .... Every Other Day 5)  Flonase 50 Mcg/act  Susp (Fluticasone Propionate) .[name].. 1 Spray in Each Nostril Two Times A Day 6)  Mucinex Dm Maximum Strength 60-1200 Mg  Tb12 (Dextromethorphan-Guaifenesin) .[name].. 1 Every 12 Hours 7)  Prednisone 10 Mg  Tabs (Prednisone) .... Taper.  Take As Directed 8)  Asmanex 60 Metered Doses 220 Mcg/inh Aepb (Mometasone Furoate) .... Inhale 2 Puffs Each Morning. 9)  Maxair Autohaler 200 Mcg/inh Aerb (Pirbuterol Acetate) .... Inhale 2 Puffs Every 6 Hours As Needed  Allergies (verified): 1)  ! 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    Review of Systems  The patient denies shortness of breath with activity, shortness of breath at rest, productive cough, non-productive cough, coughing up blood, chest pain, irregular heartbeats, acid heartburn, indigestion, loss of appetite, weight change, abdominal pain, difficulty swallowing, sore throat, tooth/dental problems, headaches, nasal congestion/difficulty breathing through nose, sneezing, itching, ear ache, anxiety, depression, hand/feet swelling, joint stiffness or pain, rash, change in color of mucus, and fever.    Vital Signs:  Patient profile:   65 year old male Height:  74 inches Weight:      160.50 pounds BMI:     20.68 O2 Sat:      99 % on Room air Temp:     97.8 degrees F oral Pulse rate:   59 / minute BP sitting:   134 / 76  (left arm) Cuff size:   regular  Vitals Entered By: Arman Filter LPN (November 01, 9145 11:53 AM)  O2 Flow:  Room air CC: Pulmonary Consult Comments Medications reviewed with patient Arman Filter LPN   November 01, 8293 12:06 PM    Physical Exam  General:  thin male in nad Eyes:  PERRLA and EOMI.   Nose:  deviated septum to left with near obstruction, ?septal perforation.  NO purulence seen Mouth:  elongation of uvula, o/w normal Neck:  no jvd, tmg, LN Lungs:  a few rhonchi, o/w clear no wheezing noted Heart:  rrr, no mrg Abdomen:  soft and nontender, bs+ Extremities:  no edema, pulses intact distally no cyanosis or clubbing Neurologic:  alert and oriented, moves all 4.   Impression & Recommendations:  Problem # 1:  INTRINSIC ASTHMA, UNSPECIFIED (ICD-493.10) The pt has been diagnosed with asthma that is fairly severe in nature.  He is having persistent symptoms unless he is on prednisone, despite an aggressive regimen otherwise.  He does not appear to have uncontrolled sinus disease or reflux disease.  I have reviewed his records from [place] at great length, and also all of his available xrays.  I suspect that he has ABPA.  At this point, because he appears prednisone dependent, would like to start therapy with xolair.  This may allow him to come off prednisone, but at least it will hopefully have a steroid sparing effect.  The pt is agreeable.  With respect to possible surgery, the pt is currently stable for the procedure.  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  However, the pt was on significant prednisone at the time it was checked.  This diagnosis typically requires the pt to be steroid dependent, but I have had some success with xolair.  Medications Added to Medication List This Visit: 1)  Advair Diskus 500-50 Mcg/dose Misc (Fluticasone-salmeterol) .... One puff twice daily 2)  Protonix 40 Mg Tbec (Pantoprazole sodium) .... Take 1 tablet by mouth two times a day 3)  Flonase 50 Mcg/act Susp (Fluticasone propionate) .[name].. 1 spray in each nostril two times a day  4)  Prednisone 10 Mg Tabs (Prednisone) .... Taper.  take as directed 5)  Asmanex 60 Metered Doses 220 Mcg/inh Aepb (Mometasone furoate) .... Inhale 2 puffs each morning. 6)  Maxair Autohaler 200 Mcg/inh Aerb (Pirbuterol acetate) .... Inhale 2 puffs every 6 hours as needed 7)  Prednisone 10 Mg Tabs (Prednisone) .... One each day  Other Orders: Consultation Level V (62130) Misc. Referral (Misc. Ref)  Patient Instructions: 1)  stay on prednisone 10mg  each day 2)  stop asmanex, stay on advair 3)  we will look into starting xolair. 4)  I will send a note to Dr. Luiz Blare letting him know that you are cleared for surgery. 5)  follow up with me in 4 weeks.  Prescriptions: PREDNISONE 10 MG  TABS (PREDNISONE) one each day  #30 x 3   Entered and Authorized by:   [name] MD   Signed by:   [name] MD on 11/01/2008  Method used:   Print then Give to Patient   RxID:   [id number]

## 2010-06-26 NOTE — Medication Information (Signed)
Summary: [facility] for Xolair/[facility]  [facility] for Xolair[facility]   Imported By: Sherian Rein 11/07/2008 11:18:07  _____________________________________________________________________  External Attachment:    Type:   Image     Comment:   External Document

## 2010-06-26 NOTE — Miscellaneous (Signed)
Summary: Injection Orders / [facility]    Injection Orders / [facility]    Imported By: Lennie Odor 10/24/2009 14:43:02  _____________________________________________________________________  External Attachment:    Type:   Image     Comment:   External Document

## 2010-06-26 NOTE — Miscellaneous (Signed)
Summary: Allergy Test/[facility]  Allergy Test/[facility]   Imported By: Sherian Rein 11/09/2008 10:12:38  _____________________________________________________________________  External Attachment:    Type:   Image     Comment:   External Document

## 2010-06-26 NOTE — Assessment & Plan Note (Signed)
Summary: rov for severe asthma   Primary Provider/Referring Provider:  [name]  CC:  Pt is here for a routine f/u appt.  Pt is currently on 10mg  of Prednisone.  Pt states breathing is "good."  Pt denied a cough.  .  History of Present Illness: the pt comes in today for f/u of his known asthma with significant allergic component.  He has been steroid dependent, and was turned down by insurance for xolair despite multiple attempts to get preauthorized.  He was referred for allergy evaluation and started on therapy, and has been doing very well since this time.  The pt is staying active, and has not had to use his rescue inhaler.  No cough, congestion, or mucus.  Current Medications (verified): 1)  Allopurinol 100 Mg  Tabs (Allopurinol) .... Once Daily 2)  Protonix 40 Mg  Tbec (Pantoprazole Sodium) .... Take 1 Tablet By Mouth Two Times A Day 3)  Lipitor 10 Mg  Tabs (Atorvastatin Calcium) .... Take 1 Tablet By Mouth Once A Day 4)  Mucinex Dm Maximum Strength 60-1200 Mg  Tb12 (Dextromethorphan-Guaifenesin) .[name].. 1 Every 12 Hours 5)  Maxair Autohaler 200 Mcg/inh Aerb (Pirbuterol Acetate) .... Inhale 2 Puffs Every 6 Hours As Needed 6)  Qvar 80 Mcg/act Aers (Beclomethasone Dipropionate) .... Inhale 2 Puffs Two Times A Day 7)  Foradil Aerolizer 12 Mcg Caps (Formoterol Fumarate) .... Inhale Contents of 1 Cap Two Times A Day 8)  Prednisone 10 Mg  Tabs (Prednisone) .... Take 1 Tablet By Mouth Once A Day 9)  Allergy Vaccine Gh [employer]  Allergies (verified): 1)  ! Levaquin  Review of Systems      See HPI  Vital Signs:  Patient profile:   65 year old male Height:      74 inches Weight:      160 pounds BMI:     20.62 O2 Sat:      95 % on Room air Temp:     97.3 degrees F oral Pulse rate:   60 / minute BP sitting:   116 / 68  (right arm) Cuff size:   regular  Vitals Entered By: Arman Filter LPN (Oct 11, 2009 9:03 AM)  O2 Flow:  Room air  Physical Exam  General:  thin male in  nad Lungs:  totally clear to auscultation Heart:  rrr Extremities:  no edema or cyanosis   Impression & Recommendations:  Problem # 1:  INTRINSIC ASTHMA, UNSPECIFIED (ICD-493.10) the pt is doing very well wrt his asthma.  He feels the allergy shots have really helped.  This has been " the best spring in years".  I have asked him to continue on his current meds, and will try and taper off prednisone slowly over the next few months.  He is to let us know if symptoms worsen.  Medications Added to Medication List This Visit: 1)  Prednisone 5 Mg Tabs (Prednisone) .... As directed.  Other Orders: Est. Patient Level III (16109)  Patient Instructions: 1)  will decrease prednisone to 5mg  a day for 4 weeks, then 2.5mg  a day for 4 weeks, then try to come off. 2)  If you have worsening symptoms, go back to the last dose that you were controlled on and give me an update. 3)  no change in inhaler regimen, continue with allergy vaccine 4)  followup with me in 4mos.  Prescriptions: PREDNISONE 5 MG TABS (PREDNISONE) as directed.  #50 x 1   Entered and Authorized by:  [name] MD   Signed by:   [name] MD on 10/11/2009   Method used:   Print then Give to Patient   RxID:   [id number]

## 2010-06-26 NOTE — Progress Notes (Signed)
Summary: [employer] info/xolair/returned a call  Phone Note Call from Patient Call back at [phone]   Caller: Spouse Call For: Dr. Shelle Iron Summary of Call: [phone] Kenn Rekowski (wife) called and wanted to know if you recvd. the information from [employer] that was faxed to you on Thurs or Friday of last week? Wife seems irritated that we haven't responded to [employer] request for a peer-to-peer.  Advised wife that Dr. Shelle Iron wrote a letter and does not have anything else to add. That they need to obtain copy of letter and review and either they will agree to pay or not. Wife is asking for me to check with Dr. Shelle Iron and see if he has recveived this information from [employer].  Dr. Shelle Iron please advise. Initial call taken by: Alfonso Ramus,  April 04, 2009 12:04 PM  Follow-up for Phone Call        I do not have any faxed info from [employer] in my possession.  Will be happy to look at if you can locate.  Contacted melissa with [employer] to refax information. LMOAM for her to return my call in regards to pt's xolair. Rhonda Cobb  April 05, 2009 10:58 AM  Called wife back. Advised that Melissa with [employer] has not called me back nor have I recvd. fax. Wife to contact pt's HR person and see if she would be able to fax information to me. Gave her the [facility] fax number and she will try to get information faxed to Dr. Shelle Iron. Alfonso Ramus  April 05, 2009 4:42 PM   Follow-up by: [name] MD,  April 04, 2009 12:27 PM  Additional Follow-up for Phone Call Additional follow up Details #1::        [employer] wants Dr. Shelle Iron to call, said it's been two weeks and haven't heard anything//[phone]-[phone]. Darletta Moll  April 11, 2009 10:54 AM    Additional Follow-up for Phone Call Additional follow up Details #2::    I have already called and get an answering machine.  I have already sent my notes that has all the info.  They will  either allow it or not. Follow-up by: [name] MD,  April 17, 2009 9:54 AM

## 2010-06-26 NOTE — Progress Notes (Signed)
Summary: coverage for alt  Phone Note Call from Patient Call back at Home Phone [phone]   Caller: Spouse Call For: young Summary of Call: pt is supposeto have allergy skin testing...wife concerned this is not covered on their insurance.  Can you verify coverage?  Please call wife first if you want. Initial call taken by: [name],  March 06, 2009 4:22 PM  Follow-up for Phone Call        pt's wife called back and said disregard message, she had her answer. Follow-up by: [name],  March 06, 2009 4:51 PM

## 2010-06-26 NOTE — Miscellaneous (Signed)
Summary: Allergy Injection Program for Xolair pts/[facility]  Allergy Injection Program for Xolair pts/[facility]   Imported By: Sherian Rein 11/07/2008 11:15:56  _____________________________________________________________________  External Attachment:    Type:   Image     Comment:   External Document

## 2010-06-26 NOTE — Medication Information (Signed)
Summary: Xolair Authorization Form  Xolair Authorization Form   Imported By: Maryln Gottron 11/22/2008 15:27:06  _____________________________________________________________________  External Attachment:    Type:   Image     Comment:   External Document

## 2010-06-26 NOTE — Progress Notes (Signed)
Summary: CXR results--ATC x1  ---- Converted from flag ---- ---- 11/05/2007 5:43 PM, [place] Locket. [name] MD wrote: let him know cxr normal pl  RA ------------------------------  No answer. Will try again later.  LC  LMTCB.     Michel Bickers [facility]  November 09, 2007 4:35 PM  Pt aware CXR is normal.   Michel Bickers CMA  November 09, 2007 4:55 PM

## 2010-06-26 NOTE — Progress Notes (Signed)
Summary: sob  Phone Note Call from Patient   Caller: Spouse Call For: Xochilt Conant Summary of Call: pt is expericing sob need to know what to do Initial call taken by: Rickard Patience,  December 22, 2008 2:39 PM  Follow-up for Phone Call        spoke with patient he informed me is scheduled to see TP at 3:45 today. Pt felt he could wait the hour to see TP and discuss SOB with her.  Follow-up by: Carron Curie CMA,  December 22, 2008 2:51 PM

## 2010-06-26 NOTE — Letter (Signed)
Summary: Reflux/[facility]  Reflux/[facility]   Imported By: Sherian Rein 11/09/2008 10:04:18  _____________________________________________________________________  External Attachment:    Type:   Image     Comment:   External Document

## 2010-06-26 NOTE — Progress Notes (Signed)
Summary: request pt instruction sheets  Phone Note Call from Patient   Caller: Spouse Call For: nurse Summary of Call: pt requests copies of his instruction sheets from the following office visits: 6/11 w/ alva / 6/18 w/ wert and 6/19 w/ tammy parrett. pt has all others. just wants the pt instruction sheets only. please mail to pt. thanks.  Initial call taken by: Tivis Ringer,  December 18, 2007 9:27 AM  Follow-up for Phone Call        Pt aware we are mailing his instructions to him from the 3 visits he requested. Follow-up by: Michel Bickers CMA,  December 18, 2007 11:14 AM

## 2010-06-26 NOTE — Letter (Signed)
Summary: [facility]  [facility]   Imported By: Sherian Rein 11/09/2008 10:00:22  _____________________________________________________________________  External Attachment:    Type:   Image     Comment:   External Document

## 2010-06-26 NOTE — Assessment & Plan Note (Signed)
Summary: 6 months/apc   Copy to:  [facility] Primary Provider/Referring Provider:  [name]  CC:  6 month follow up visit-allergies; no compliants..  History of Present Illness: History of Present Illness: 03/06/09- 62 yoM seen in Allergy consultation on kind referral by Dr Shelle Iron, with question of Xolair candidacy. He has minimal smoking hx and quit 40 years ago, but carries a dx of asthma/ copd. Has had some itching of eyes and says chest tightness and wheeze are worst in Spring and Fall.  RAST testing 01/29/08- IgE 364, with specific elevations for mite, grass, cockroach, trees. Rhinitis and itching eyes increased when he moved here 4 years ago. For the last 2 years symptoms have extended March til September and he has noted dyspnea, wheeze and chest tightness. Also reports nasal septal perforation with"mild sinus". PFTs reviewed.  December 25, 2009- Asthma, Hx ABPA 21 yoM foillowed b y Dr Shelle Iron for severe obstructive asthma. He was unable to qualify for Xolair and has been on allergy vaccine. He is "amazed" at how well he is doing. As of last week he is off prednisone for the first time in 2-3 years. Denies problems or reactions with the allergy shots. He hasn't used rescue inhaler more than 4-5 times in recent months- a marked improvement this year.  Asthma History    Initial Asthma Severity Rating:    Age range: 12+ years    Symptoms: 0-2 days/week    Nighttime Awakenings: 0-2/month    Interferes w/ normal activity: no limitations    SABA use (not for EIB): 0-2 days/week    Asthma Severity Assessment: Intermittent   Preventive Screening-Counseling & Management  Alcohol-Tobacco     Smoking Status: quit     Year Quit: 1985     Pack years: 64yrs 1 pk a day  Current Medications (verified): 1)  Allopurinol 100 Mg  Tabs (Allopurinol) .... Once Daily 2)  Protonix 40 Mg  Tbec (Pantoprazole Sodium) .... Take 1 Tablet By Mouth Two Times A Day 3)  Lipitor 10 Mg  Tabs (Atorvastatin  Calcium) .... Take 1 Tablet By Mouth Once A Day 4)  Mucinex Dm Maximum Strength 60-1200 Mg  Tb12 (Dextromethorphan-Guaifenesin) .[name].. 1 Every 12 Hours 5)  Maxair Autohaler 200 Mcg/inh Aerb (Pirbuterol Acetate) .... Inhale 2 Puffs Every 6 Hours As Needed 6)  Qvar 80 Mcg/act Aers (Beclomethasone Dipropionate) .... Inhale 2 Puffs Two Times A Day 7)  Foradil Aerolizer 12 Mcg Caps (Formoterol Fumarate) .... Inhale Contents of 1 Cap Two Times A Day 8)  Allergy Vaccine Gh [employer]  Allergies (verified): 1)  ! 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    Risk Factors: Smoking Status: quit (12/25/2009)  Past Medical History: severe asthma- allergy vaccine 2011  Review of Systems      See HPI       The patient complains of shortness of breath with activity.  The patient denies shortness of breath at rest, productive cough, non-productive cough, coughing up blood, chest pain, irregular heartbeats, acid heartburn, indigestion, loss of appetite, weight change, abdominal pain, difficulty swallowing, sore throat,  tooth/dental problems, headaches, nasal congestion/difficulty breathing through nose, and sneezing.    Vital Signs:  Patient profile:   65 year old male Height:      74 inches Weight:      157.13 pounds BMI:     20.25 O2 Sat:      96 % on Room air Pulse rate:   67 / minute BP sitting:   114 / 70  (right arm) Cuff size:   regular  Vitals Entered By: Reynaldo Minium CMA (December 25, 2009 9:36 AM)  O2 Flow:  Room air CC: 6 month follow up  visit-allergies; no compliants.   Physical Exam  Additional Exam:  General: A/Ox3; pleasant and cooperative, NAD, thin SKIN: no rash, lesions NODES: no lymphadenopathy HEENT: [place]/AT, EOM- WNL, Conjuctivae- clear, PERRLA, TM-WNL, Nose- clear, Throat- clear and wnl, Mallampati II, caps on teeth. voice normal NECK: Supple w/ fair ROM, JVD- none, normal carotid impulses w/o bruits Thyroid- normal to palpation CHEST: Clear to P&A, somewhat diminished, with no wheeze or cough HEART: RRR, no m/g/r heard ABDOMEN: Soft and nl;  UJW:JXBJ, nl pulses, no edema  NEURO: Grossly intact to observation      Impression & Recommendations:  Problem # 1:  ASTHMA (ICD-493.90) He is doing extremely well on current therapy. We discussed vaccine again and will move him up to 1:10 with next order.  We will be watching to see if his improvement persists with the change of seasons.  Medications Added to Medication List This Visit: 1)  Allergy Vaccine Gh  .... Adv to 1:10 next order  Other Orders: Est. Patient Level III (47829)  Patient Instructions: 1)  Please schedule a follow-up appointment in 6 months. 2)  With the next vaccine order, I will move your vaccine concentration up to 1:10 and we will hold level there. Please feel free to contact me if needed.

## 2010-06-26 NOTE — Assessment & Plan Note (Signed)
Summary: rov for severe asthma.   Copy to:  [facility] Primary Provider/Referring Provider:  [name]  CC:  Pt is here for a follow up appt.  Pt saw TP 12-22-2008.  Pt states breathing is doing good.  Pt states an occ. cough with small amounts of yellow sputum.  Pt denied any new complaints.  Pt is currently on 20mg  of Prednisone.  Pt requests rx for Maxair. [name]  History of Present Illness: the pt comes in today for f/u of his asthma and ?ABPA.  He has continued to require moderate doses of prednisone in order to keep his symptoms controlled, and currently is doing fairly well.  He denies any cough, congestion, or excessive rescue inhaler use.  He was seen recently by our NP for acute exacerbation, and did well with a bump in his prednisone dose.  He is staying active as usual.  Medications Prior to Update: 1)  Allopurinol 100 Mg  Tabs (Allopurinol) .... Once Daily 2)  Protonix 40 Mg  Tbec (Pantoprazole Sodium) .... Take 1 Tablet By Mouth Two Times A Day 3)  Lipitor 10 Mg  Tabs (Atorvastatin Calcium) .... Take 1 Tablet By Mouth Once A Day 4)  Flonase 50 Mcg/act  Susp (Fluticasone Propionate) .[name].. 1 Spray in Each Nostril Two Times A Day When Needed 5)  Mucinex Dm Maximum Strength 60-1200 Mg  Tb12 (Dextromethorphan-Guaifenesin) .[name].. 1 Every 12 Hours 6)  Maxair Autohaler 200 Mcg/inh Aerb (Pirbuterol Acetate) .... Inhale 2 Puffs Every 6 Hours As Needed 7)  Qvar 80 Mcg/act Aers (Beclomethasone Dipropionate) .... Inhale 2 Puffs Two Times A Day 8)  Foradil Aerolizer 12 Mcg Caps (Formoterol Fumarate) .... Inhale Contents of 1 Cap Two Times A Day 9)  Prednisone 10 Mg  Tabs (Prednisone) .... As Directed - Currently Taking 20mg  Daily 10)  Alprazolam 0.25 Mg Tabs (Alprazolam) .... 1/2 -1 By Mouth Two Times A Day As Needed Anxiety  Allergies (verified): 1)  ! Morphine 2)  ! Levaquin  Review of Systems      See HPI  Vital Signs:  Patient profile:   65 year old male Height:      72 inches Weight:       161.38 pounds O2 Sat:      98 % on Room air Temp:     97.6 degrees F oral Pulse rate:   55 / minute BP sitting:   112 / 62  (right arm) Cuff size:   regular  Vitals Entered By: Arman Filter LPN (January 16, 2009 8:57 AM)  O2 Flow:  Room air CC: Pt is here for a follow up appt.  Pt saw TP 12-22-2008.  Pt states breathing is doing good.  Pt states an occ. cough with small amounts of yellow sputum.  Pt denied any new complaints.  Pt is currently on 20mg  of Prednisone.  Pt requests rx for Maxair.  Comments Medications reviewed with patient Arman Filter LPN  January 16, 2009 8:57 AM    Physical Exam  General:  thin male in nad Lungs:  clear to ausculation Heart:  rrr Extremities:  no edema no cyanosis Neurologic:  pulses intact distally   Impression & Recommendations:  Problem # 1:  INTRINSIC ASTHMA, UNSPECIFIED (ICD-493.10) The pt is doing well currently, but is requiring moderate doses of prednisone to do so.  I have called for a peer to peer after xolair denial by [employer], and have only gotten answering machine to leave a message.  I have  written a letter to appeal the decision, and await the outcome.  We need to get him off the current level of prednisone due to long term side effects.  Medications Added to Medication List This Visit: 1)  Prednisone 10 Mg Tabs (Prednisone) .... 4 by mouth qday and as directed.  Other Orders: Est. Patient Level III (16109)  Patient Instructions: 1)  stay on prednisone 20mg  a day until we here from insurance company regarding xolair. 2)  no change in other meds. 3)  follow up with me in 3mos, but I will contact you regarding treatment with xolair if ok'ed by insurance.  Prescriptions: PREDNISONE 10 MG  TABS (PREDNISONE) 4 by mouth qday and as directed.  #100 x 1   Entered and Authorized by:   [name] MD   Signed by:   [name] MD on 01/16/2009   Method used:   Print then Give to Patient   RxID:   [id number]  NFAOZH AUTOHALER 200 MCG/INH AERB (PIRBUTEROL ACETATE) inhale 2 puffs every 6 hours as needed  #1 x 6   Entered and Authorized by:   [name] MD   Signed by:   [name] MD on 01/16/2009   Method used:   Print then Give to Patient   RxID:   [id number]

## 2010-06-26 NOTE — Assessment & Plan Note (Signed)
Summary: Acute NP office visit - hemoptysis   Copy to:  [facility] Primary Provider/Referring Provider:  [name]  CC:  occ prod cough with red mucus tinged with brown, back discomfort, and LE edema x10days - denies f/c/s.  finished cipro 04-07-10.  History of Present Illness: 65 yo WM with known hx of Asthma   04/02/10--the pt comes in today for f/u of his known asthma.  He his 4-5 weeks out from [facility], and did well with the surgery.  At the last visit in mid Sept, he was felt to have possible sinobronchitis.  He was given a prescription for an antibiotic, but unfortunately he forgot to fill.  He has continued to have some chest congestion with cough and purulent mucus at times, but does not feel that it is affecting his breathing that much.    April 24, 2010 --Presents for an acute office visit. Complains of  prod cough with red mucus tinged with brown, back discomfort, LE edema x10days -   finished cipro 04-07-10. Over last few weeks has had cough, congesiton. Was treated w/ cipro with no significant improvment. Over last 10 days has started coughing up blood tinged mucus-bright red at times. Mucus is streaked with blood, mainly has been brown Irine Seal color. Seen few bright red flakes initially. . Had THR 2 months ago, was tx w/ coumadin -which he finished 1 month ago. Denies chest pain,  orthopnea, hemoptysis, fever, n/v/d,  headache. Legs have had intermittent swelling since surgery, no calf pain . No swelling over last few days. Says he does not feel bad , just cough will not go away. No exertional chest pain or tightness.   Medications Prior to Update: 1)  Allopurinol 100 Mg  Tabs (Allopurinol) .... Once Daily 2)  Protonix 40 Mg  Tbec (Pantoprazole Sodium) .... Take 1 Tablet By Mouth Two Times A Day 3)  Lipitor 10 Mg  Tabs (Atorvastatin Calcium) .... Take 1 Tablet By Mouth Once A Day 4)  Mucinex Dm Maximum Strength 60-1200 Mg  Tb12 (Dextromethorphan-Guaifenesin) .[name].. 1 Every 12  Hours 5)  Maxair Autohaler 200 Mcg/inh Aerb (Pirbuterol Acetate) .... Inhale 2 Puffs Every 6 Hours As Needed 6)  Qvar 80 Mcg/act Aers (Beclomethasone Dipropionate) .... Inhale 2 Puffs Two Times A Day 7)  Foradil Aerolizer 12 Mcg Caps (Formoterol Fumarate) .... Inhale Contents of 1 Cap Two Times A Day 8)  Allergy Vaccine Gh .... Adv To 1:10 Next Order 9)  Prednisone 5 Mg Tabs (Prednisone) .... Once Daily 10)  Cefdinir 300 Mg Caps (Cefdinir) .... 2 Each Am For 5 Days.  Current Medications (verified): 1)  Allopurinol 100 Mg  Tabs (Allopurinol) .... Once Daily 2)  Protonix 40 Mg  Tbec (Pantoprazole Sodium) .... Take 1 Tablet By Mouth Two Times A Day 3)  Lipitor 10 Mg  Tabs (Atorvastatin Calcium) .... Take 1 Tablet By Mouth Once A Day 4)  Mucinex Dm Maximum Strength 60-1200 Mg  Tb12 (Dextromethorphan-Guaifenesin) .[name].. 1 Every 12 Hours 5)  Maxair Autohaler 200 Mcg/inh Aerb (Pirbuterol Acetate) .... Inhale 2 Puffs Every 6 Hours As Needed 6)  Qvar 80 Mcg/act Aers (Beclomethasone Dipropionate) .... Inhale 2 Puffs Two Times A Day 7)  Foradil Aerolizer 12 Mcg Caps (Formoterol Fumarate) .... Inhale Contents of 1 Cap Two Times A Day 8)  Allergy Vaccine Gh .... Adv To 1:10 Next Order 9)  Prednisone 5 Mg Tabs (Prednisone) .... Once Daily  Allergies (verified): 1)  ! 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    Risk Factors: Smoking Status: quit (12/25/2009)  Review of Systems       See HPI  Vital Signs:  Patient profile:   65 year old male Height:      74 inches Weight:      156.25 pounds BMI:     20.13 O2 Sat:      98 % on Room air Temp:     97.0 degrees F oral Pulse rate:   74 / minute BP sitting:   122 / 74  (left arm) Cuff size:   regular  Vitals Entered By: Boone Master CNA/MA (April 24, 2010 3:08 PM)  O2 Flow:  Room air CC: occ prod cough with red mucus tinged with brown, back discomfort, LE edema x10days - denies f/c/s.  finished cipro 04-07-10 Is Patient Diabetic? No Comments Medications reviewed with patient Daytime contact number verified with patient. Boone Master CNA/MA  April 24, 2010 3:07 PM    Physical Exam  Additional Exam:  General: A/Ox3; pleasant and cooperative, NAD, thin SKIN: no rash, lesions NODES: no lymphadenopathy HEENT: [place]/AT, EOM- WNL, Conjuctivae- clear, PERRLA, TM-WNL, Nose- clear, Throat- clear and wnl, Mallampati II, caps on teeth. voice normal NECK: Supple w/ fair ROM, JVD- none, normal carotid impulses w/o bruits Thyroid- normal to palpation CHEST: Clear to P&A, somewhat diminished, with no wheeze or cough HEART: RRR, no m/g/r heard ABDOMEN: Soft and nl;  VZD:GLOV, nl pulses, no edema  NEURO: Grossly intact to observation      Impression & Recommendations:  Problem # 1:  INTRINSIC ASTHMA, WITH EXACERBATION (ICD-493.12) Asthmatic bronchitis slow to resolve w/ new onset hemoptysis CXR today w/ no acute process. pt O2 sat are nml, no chest pain or calf pain -nothing to suggest PE  Will treat for bronchiitis with close follow up  if does not resolve or improve will need CT chest to evaluate.  Plan: Augmentin 875mg  two times a day for 7 days  Mucinex DM two times a day as needed cough  Hydromet 1-2 tsp every 4-6 hr as needed cough , may make you sleepy.  Increase Prednisone 20mg  once daily for 3 days then 10mg  once daily for 3 days then back to 5mg  once daily  Increase fluids and rest.  Hold Aspirin.   Please contact office for sooner follow up if symptoms do not improve or worsen  follow up Dr. Shelle Iron in 1 week and as needed   Medications Added to Medication List This Visit: 1)  Augmentin 875-125 Mg Tabs (Amoxicillin-pot clavulanate) .[name].. 1 by mouth two times a day 2)  Hydromet 5-1.5 Mg/6ml Syrp (Hydrocodone-homatropine) .[name].. 1 -2 tsp every 4-6 hrs as needed cough , may make you sleepy  Complete Medication List: 1)  Allopurinol 100 Mg Tabs (Allopurinol) .... Once daily 2)  Protonix 40 Mg Tbec (Pantoprazole sodium) .... Take 1 tablet by mouth two times a day 3)  Lipitor 10 Mg Tabs (Atorvastatin calcium) .... Take 1 tablet by mouth once a day 4)  Mucinex Dm Maximum Strength 60-1200 Mg  Tb12 (Dextromethorphan-guaifenesin) .[name].. 1 every 12 hours 5)  Maxair Autohaler 200 Mcg/inh Aerb (Pirbuterol acetate) .... Inhale 2 puffs every 6 hours as needed 6)  Qvar 80 Mcg/act Aers (Beclomethasone dipropionate) .... Inhale 2 puffs two times a day 7)  Foradil Aerolizer 12 Mcg Caps (Formoterol fumarate) .... Inhale contents of 1 cap two times a day 8)  Allergy Vaccine Gh  .... Adv to 1:10 next order 9)  Prednisone 5 Mg Tabs (Prednisone) .... Once daily 10)  Augmentin 875-125 Mg Tabs (Amoxicillin-pot clavulanate) .[name].. 1 by mouth two times a day 11)  Hydromet 5-1.5 Mg/93ml Syrp (Hydrocodone-homatropine) .[name].. 1 -2 tsp every 4-6 hrs as needed cough , may make you sleepy  Other Orders: T-2 View CXR (71020TC) Est. Patient Level IV (54098)  Patient Instructions: 1)  Augmentin 875mg  two times a day for 7 days  2)  Mucinex DM two times a day as needed cough  3)  Hydromet 1-2 tsp every 4-6 hr as needed cough , may make you sleepy.  4)  Increase Prednisone 20mg  once daily for 3 days then 10mg  once daily for 3 days then back to 5mg  once daily  5)  Increase fluids and rest.  6)  Hold Aspirin.  7)  Please contact office for sooner follow up if symptoms do not improve or worsen  8)  follow up Dr. Shelle Iron in 1 week  and as needed  Prescriptions: HYDROMET 5-1.5 MG/5ML SYRP (HYDROCODONE-HOMATROPINE) 1 -2 tsp every 4-6 hrs as needed cough , may make you sleepy  #8 oz x 0   Entered and Authorized by:   Rubye Oaks NP   Signed by:   Wrenly Lauritsen NP on 04/24/2010   Method used:   Print then Give to Patient   RxID:   [id number] AUGMENTIN 875-125 MG TABS (AMOXICILLIN-POT CLAVULANATE) 1 by mouth two times a day  #14 x 0   Entered and Authorized by:   Rubye Oaks NP   Signed by:   Kaleab Frasier NP on 04/24/2010   Method used:   Electronically to        [id number] ([facility])       [address]       [place], [place]  [address]       Ph: [phone] or [phone]       Fax: [phone]   RxID:   [id number]

## 2010-06-26 NOTE — Assessment & Plan Note (Signed)
Summary: Levi [name] pt/persistant cough, keeping pt awake/apc   Visit Type:  acute Referred by:  [name] PCP:  meyers  Chief Complaint:  Acute visit.  Pt of Dr. [name] c/o persistant cough x 3 months.  He states that cough is non prod and is worse at night.  States that somethimes it wakes him up.  He also c/o sob and especially at night.  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  Already tried singulair and proventil without benefit. Saw [name] 6/1 with nl cxr and pft's  Pt denies any significant sore throat, nasal congestion or excess secretions, fever, chills, sweats, unintended wt loss, pleuritic or exertional cp, orthopnea pnd or leg swelling.  Pt also denies any obvious fluctuation in symptoms with weather or environmental change or other alleviating or aggravating factors.          Past Medical History:    Hyperlipidemia   Family History:    neg atopy or resp dz  Social History:    married lives with wife    has children    retired.Levi KitchenMarland KitchenMarland KitchenMarland Kitchenpoliceman from [employer] remote smoker    Review of Systems  The patient denies anorexia, fever, weight loss, weight gain, vision loss, decreased hearing, hoarseness, chest pain, syncope, peripheral edema, hemoptysis, abdominal pain, melena, hematochezia, severe indigestion/heartburn, hematuria, incontinence, muscle weakness, transient blindness, difficulty walking, depression, unusual weight change, abnormal bleeding, and enlarged lymph nodes.     Vital Signs:  Patient Profile:   65 Years Old Male Weight:      157 pounds O2 Sat:      100 % O2 treatment:    Room Air Temp:     97.5 degrees F oral Pulse rate:   57 / minute BP  sitting:   118 / 74  (left arm)  Vitals Entered By: [name] (November 12, 2007 10:45 AM) 10:45 AM)                 Physical Exam  freq tc  pseudowheeze resolves with purse lip maneuver    Ambulatory anxious white male in no acute distress. Afeb with normal vital signs HEENT: nl dentition, turbinates, and orophanx. Nl external ear canals without cough reflex. frequent throat clearing noted although there is no evidence of excess secretions Neck without JVD/Nodes/TM Lungs clear to A and P bilaterally without cough on insp or exp maneuvers. prominent pseudo-wheeze results with first Cheron Every for RRR no s3 or murmur or increase in P2 Abd soft and benign with nl excursion in the supine position. No bruits or organomegaly Ext warm without calf tenderness, cyanosis clubbing or edema Skin warm and dry without lesions       Impression & Recommendations:  Problem # 1:  Cough Of the three most common causes of chronic cough, only one can actually cause the other two and perpetuate the cylce of cough inducing airway trauma, inflammation, heightened sensitivity to reflux which is prompted by the cough itself via a cyclical mechanism.  This may partially respond to steroids and look like asthma and post nasal drainage but never erradicated completely unless the cough and the secondary reflux are eliminated, preferably both at  the same time.   I suspect that the problem is upper airway cough syndrome perhaps with underlying sinusitis and recommended a 10-day course of Augmentin at the end of this a sinus CT scan if still coughing.  In the meantime, to prevent cyclical coughing, I recommended that he call suppression along with aggressive treatment directed at the acid component of reflux and a short cycle of prednisone in case there is any primary or secondary inflammation in the upper airway at this point related to cough itself.  I demonstrated for him that using pursed lip maneuver contaminates his  wheezing.  I don't believe that there is any evidence of true asthma here noting that previous attempts to treat him with poor bowel have been ineffective and that he's breaking right through Singulair also at this point so I've asked him to stop  it.   Medications Added to Medication List This Visit: 1)  Lipitor 10 Mg Tabs (Atorvastatin calcium) .... Every other day 2)  Protonix 40 Mg Pack (Pantoprazole sodium) .... Take one 30-60 min before first and last meals of the day 3)  Mucinex Dm Maximum Strength 60-1200 Mg Tb12 (Dextromethorphan-guaifenesin) .... 2 every 12 hours 4)  Tramadol Hcl 50 Mg Tabs (Tramadol hcl) .... One every 4 hours if needed for coughing 5)  Claritin 10 Mg Tabs (Loratadine) .... As needed 6)  Prednisone 10 Mg Tabs (Prednisone) .... 4 each am x 2 days,  3 x 2days, 2x2 days, and 1x2 days 7)  Augmentin 875-125 Mg Tabs (Amoxicillin-pot clavulanate) .... By mouth twice daily with meals  Other Orders: Est. Patient Level IV (21308)   Patient Instructions: 1)  GERD (gastroesophageal reflux disease) was discussed. It is a common cause of respiratory symptoms. It commonly presents in the absence of heartburn. GERD can be treated with medication, but also with lifestyle changes including avoidance of late meals, excessive alcohol, smoking cessation, and avoid fatty foods, chocolate, peppermint, colas, red wine, and acidic juices such as orange juice. NO MINT OR MENTHOL PRODUCTS 2)  USE SUGARLESS CANDY INSTEAD (jolley ranchers)  3)  Keep Appt to see dr [name] in July, see Tammy earlier if not improving   Prescriptions: AUGMENTIN 875-125 MG  TABS (AMOXICILLIN-POT CLAVULANATE) By mouth twice daily with meals  #20 x 0   Entered and Authorized by:   Nyoka Cowden MD   Signed by:   Nyoka Cowden MD on 11/12/2007   Method used:   Elect[facility] to ...       [id number]       [address]       [place], [place]  [address]       Ph: [phone] or  [phone]       Fax: [phone]   RxID:   [id number] TRAMADOL HCL 50 MG  TABS (TRAMADOL HCL) One every 4 hours if needed for coughing  #40 x 0   Entered and Authorized by:   Nyoka Cowden MD   Signed by:   Nyoka Cowden MD on 11/12/2007   Method used:   Electronically sent to ...       [id number]       [address]       [place], [place]  [address]       Ph: [phone] or [phone]  Fax: [phone]   RxID:   [id number] PREDNISONE 10 MG  TABS (PREDNISONE) 4 each am x 2 days,  3 x 2days, 2x2 days, and 1x2 days  #20 x 0   Entered and Authorized by:   Nyoka Cowden MD   Signed by:   Nyoka Cowden MD on 11/12/2007   Method used:   Electronically sent to ...       [id number]       [address]       [place], [place]  [address]       Ph: [phone] or [phone]       Fax: [phone]   RxID:   [id number]  ]

## 2010-06-26 NOTE — Assessment & Plan Note (Signed)
Summary: acute sick visit for asthma exacerbation   Copy to:  [facility] Primary Provider/Referring Provider:  [name]  CC:  Pt c/o of chest congestion, productive cough with brown phlem, dyspnea and some days worse than others, and Pt states when he takes a deep breath he gets pain in his upper back. Pt states he also needs surgery clearance.  History of Present Illness: the pt comes in for an acute sick visit today.  He has known severe asthma, and gives a one week history of increased chest congestion, cough with small quantities of brown mucus, and increased sob when doing his exercise routine.  He feels that he is developing an acute flare of his asthma.  He has done very well off prednisone until this particular episode, and admits that he usually has the most difficutly with fall allergies.  Current Medications (verified): 1)  Allopurinol 100 Mg  Tabs (Allopurinol) .... Once Daily 2)  Protonix 40 Mg  Tbec (Pantoprazole Sodium) .... Take 1 Tablet By Mouth Two Times A Day 3)  Lipitor 10 Mg  Tabs (Atorvastatin Calcium) .... Take 1 Tablet By Mouth Once A Day 4)  Mucinex Dm Maximum Strength 60-1200 Mg  Tb12 (Dextromethorphan-Guaifenesin) .[name].. 1 Every 12 Hours 5)  Maxair Autohaler 200 Mcg/inh Aerb (Pirbuterol Acetate) .... Inhale 2 Puffs Every 6 Hours As Needed 6)  Qvar 80 Mcg/act Aers (Beclomethasone Dipropionate) .... Inhale 2 Puffs Two Times A Day 7)  Foradil Aerolizer 12 Mcg Caps (Formoterol Fumarate) .... Inhale Contents of 1 Cap Two Times A Day 8)  Allergy Vaccine Gh .... Adv To 1:10 Next Order  Allergies (verified): 1)  ! Levaquin  Past History:  Past medical, surgical, family and social histories (including risk factors) reviewed, and no changes noted (except as noted below).  Past Medical History: Reviewed history from 12/25/2009 and no changes required. severe asthma- allergy vaccine 2011  Past Surgical History: Reviewed history from 11/05/2007 and no changes  required. 3 knee surgeries 2 foot surgeries gallbladder -1995  Family History: Reviewed history from 11/01/2008 and no changes required. neg atopy or resp dz  heart disease: father cancer: brother (prostate)   Social History: Reviewed history from 11/01/2008 and no changes required. 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    Review of Systems       The patient complains of productive cough, non-productive cough, nasal congestion/difficulty breathing through nose, joint stiffness or pain, and change in color of mucus.  The patient denies shortness of breath at rest, coughing up blood, chest pain, irregular heartbeats, acid heartburn, indigestion, loss of appetite, weight change, abdominal pain, difficulty swallowing, sore throat, tooth/dental problems, headaches, sneezing, itching, ear ache, anxiety, depression, hand/feet swelling, rash, and fever.    Vital Signs:  Patient profile:   65 year old male Height:      74 inches Weight:      156 pounds O2 Sat:      100 % on Room air Temp:     97.8 degrees F oral Pulse rate:   56 / minute BP sitting:   116 / 74  (right arm) Cuff size:   regular  Vitals Entered By: Carver Fila (February 07, 2010 1:58 PM)  O2 Flow:  Room air CC: Pt c/o of chest congestion, productive cough with brown phlem, dyspnea and some days worse than others, Pt states  when he takes a deep breath he gets pain in his upper back. Pt states he also needs surgery clearance Comments meds and allergies updated Phone number updated Carver Fila  February 07, 2010 1:56 PM    Physical Exam  General:  thin male in nad Nose:  no purulence or drainage noted Mouth:  clear Lungs:  mild decreased bs , no wheezing or rhonchi no crackles. Heart:  rrr, no mrg Extremities:  no edema or cyanosis  Neurologic:  alert and oriented,  moves all 4.   Impression & Recommendations:  Problem # 1:  INTRINSIC ASTHMA, WITH EXACERBATION (ICD-493.12) the pt comes in today with a history c/w an acute asthma exacerbation.  He had been doing quite well off prednisone prior to this.  Will treat him with a course of prednisone because of his symptoms, and high risk of decompensation if left untreated.  He is due to have surgery on his hip in a few weeks, and should be able to this barring any complications.  Problem # 2:  BRONCHITIS, ACUTE (ICD-466.0) the pt is coughing up small quantities of purulent mucus.  It is unclear if this is due to airway inflammation or true infection.  Since he is due for upcoming surgery, will go ahead and treat him with abx so that he is optimized from a pulmonary standpoint.  Medications Added to Medication List This Visit: 1)  Prednisone 10 Mg Tabs (Prednisone) .... Take 4 each day for 2 days, then 3 each day for 2 days, then 2 each day for 2 days, then go to 1/2 each day until next visit with me. 2)  Cefdinir 300 Mg Caps (Cefdinir) .... 2 each am for the next 5 days  Other Orders: Est. Patient Level IV (16109)  Patient Instructions: 1)  will treat with a course of prednisone, and stop at 5mg  a day until we get thru the fall. 2)  start on omnicef 300mg  2 each am for 5 days. 3)  followup with me in beginning of november. Cancel apptm for tomorrow. 4)  will send a note to ortho clearing you for hip surgery.   Prescriptions: FORADIL AEROLIZER 12 MCG CAPS (FORMOTEROL FUMARATE) inhale contents of 1 cap two times a day  #60 x 5   Entered by:   Arman Filter LPN   Authorized by:   [name] MD   Signed by:   Arman Filter LPN on 60/45/4098   Method used:   [facility]        [id number]       [address] [place], [place]  [address]       Ph: [phone] or [phone]       Fax: [phone]   RxID:   [id number] PROTONIX 40 MG  TBEC  (PANTOPRAZOLE SODIUM) Take 1 tablet by mouth two times a day  #60 Tablet x 5   Entered by:   Arman Filter LPN   Authorized by:   [name] MD   Signed by:   Arman Filter LPN on 56/38/7564   Method used:   [facility]        [id number]       [address]       [place], [place]  [address]       Ph:  [phone] or [phone]       Fax: [phone]   RxID:   [id number] CEFDINIR 300 MG CAPS (CEFDINIR) 2 each am for the next 5 days  #10 x 0   Entered and Authorized by:   [name] MD   Signed by:   [name] MD on 02/07/2010   Method used:   Print then Give to Patient   RxID:   [id number] PREDNISONE 10 MG  TABS (PREDNISONE) take 4 each day for 2 days, then 3 each day for 2 days, then 2 each day for 2 days, then go to 1/2 each day until next visit with me.  #100 x 0   Entered and Authorized by:   [name] MD   Signed by:   [name] MD on 02/07/2010   Method used:   Print then Give to Patient   RxID:   [id number]

## 2010-06-26 NOTE — Progress Notes (Signed)
Summary: req to see kc/ pulm consult  Phone Note Call from Patient Call back at Home Phone [phone]   Caller: Spouse Call For: alva/ clance Summary of Call: spouse wants pt to be seen as a new consult w/ kc since she says this is who was recommended by his orth- dr Jodi Geralds. note: dr graves had called earlier and requested that pt have a pulm f/u with dr clance since pt did not want to see dr wer. since pt has changed to seeing dr [name], i made a f/u w/ alva and called pt but spouse wants to speak to nurse re: this. [phone] Initial call taken by: Tivis Ringer,  Oct 20, 2008 1:03 PM  Follow-up for Phone Call        [email or web address] [name]  Oct 20, 2008 1:57 PM  Tammy Earlene Plater is working on [employer]. [name]  Oct 20, 2008 2:45 PM     Additional Follow-up for Phone Call Additional follow up Details #1::        ok to schedule for self referral Additional Follow-up by: [name] MD,  Oct 20, 2008 4:47 PM    Additional Follow-up for Phone Call Additional follow up Details #2::    pt's medical records from [facility] have been sent.  Please call pt's wife and LM when these are rec'd.  Aundra Millet, have you seen any records on this pt yet? Please advise thanks! [name]  October 26, 2008 8:43 AM   No, I do not have records on this pt yet. I also checked up front for papers in the cabinet and there was nothing.   Aundra Millet Reynolds LPN  October 26, 4780 10:38 AM  Follow-up by: [name],  October 26, 2008 8:13 AM  Additional Follow-up for Phone Call Additional follow up Details #3:: Details for Additional Follow-up Action Taken: Called spoke with pt's wife.  Advised no records received yet.  She states records were just mailed out from [place], [place] on 10/25/08.  Aundra Millet will you watch for these records and call pt's wife and advise once received.  Thanks Cloyde Reams RN  October 26, 2008 10:59 AM    received faxed records.  Please call pt and inform him.  thanks.  Megan Reynolds LPN   November 01, 9560 9:22 AM   Pt here to be seen. Aundra Millet Reynolds LPN  November 02, 1306 12:14 PM

## 2010-06-26 NOTE — Progress Notes (Signed)
Summary: sob  Phone Note Call from Patient   Caller: Patient Call For: Keisha Amer Summary of Call: pt have sob would like to go back on prednisone Initial call taken by: Rickard Patience,  December 21, 2008 10:09 AM  Follow-up for Phone Call        Pt tapering down on Prednisone and is currently at 15mg  daily. He has noticed he is more sob on this dosage and took  25mg  today and his breathing is better. Please advise on how you would like pt to take Prednisone since more sob on the 15mg . Thanks. Michel Bickers [facility]  December 21, 2008 10:25 AM  Additional Follow-up for Phone Call Additional follow up Details #1::        tell him to take 20mg  each day for now.  Let him know I am in the process of trying to get his xolair pre-authorized. Additional Follow-up by: [name] MD,  December 21, 2008 4:39 PM    Additional Follow-up for Phone Call Additional follow up Details #2::    pt advised of Dr. Teddy Spike recs Follow-up by: Carron Curie CMA,  December 21, 2008 4:47 PM

## 2010-06-26 NOTE — Letter (Signed)
Summary: Pt letter to [employer] letter to [employer]   Imported By: Sherian Rein 07/12/2009 14:29:13  _____________________________________________________________________  External Attachment:    Type:   Image     Comment:   External Document

## 2010-06-26 NOTE — Progress Notes (Signed)
Summary: allergy shots  Phone Note Call from Patient   Caller: Spouse Call For: Jackson Coffield Summary of Call: need to talk to nurse about allergy shots Initial call taken by: Rickard Patience,  April 28, 2009 1:26 PM  Follow-up for Phone Call        Spoke with pt; message has been taken care of; no questions about allergy shots. Reynaldo Minium CMA  April 28, 2009 4:14 PM

## 2010-06-26 NOTE — Assessment & Plan Note (Signed)
Summary: sob/ coughing/ mbw   Referred by:  [name] PCP:  meyers  Chief Complaint:  sick visit.....Marland KitchenGERD problems...Marland KitchenMarland KitchenMarland Kitchenreviewed meds......  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 Gvien augmentin, prednisone and cough suppression regimen with mucinex dm and tramadol. protonix increased to two times a day and 100% better but only while on med, coughing so hard feels food coming up now.  Denies dysphagia.  Pt denies any significant sore throat, nasal congestion or excess secretions, fever, chills, sweats, unintended wt loss, pleuritic or exertional cp, orthopnea pnd or leg swelling.  Pt also denies any obvious fluctuation in symptoms with weather or environmental change or other alleviating or aggravating factors.           Updated Prior Medication List: ALLOPURINOL 100 MG  TABS (ALLOPURINOL) once daily LIPITOR 10 MG  TABS (ATORVASTATIN CALCIUM) every other day PROTONIX 40 MG  PACK (PANTOPRAZOLE SODIUM) Take one 30-60 min before first and last meals of the day MUCINEX DM MAXIMUM STRENGTH 60-1200 MG  TB12 (DEXTROMETHORPHAN-GUAIFENESIN) 2 every 12 hours  Current Allergies (reviewed today): ! MORPHINE  Past Medical History:    Reviewed history from 11/13/2007 and no changes required:       BRONCHITIS, ACUTE (ICD-466.0)           Family History:    Reviewed history from 11/12/2007 and no changes required:       neg atopy or resp dz  Social History:    Reviewed history from 11/12/2007 and no changes required:       married lives with wife       has children       retired.Marland KitchenMarland KitchenMarland KitchenMarland Kitchenpoliceman from [employer] remote smoker    Review of Systems  The patient denies anorexia, fever, weight loss, weight gain, vision loss, decreased hearing, chest pain, syncope, peripheral edema, headaches, hemoptysis, abdominal pain, melena,  hematochezia, severe indigestion/heartburn, hematuria, incontinence, muscle weakness, suspicious skin lesions, transient blindness, difficulty walking, depression, unusual weight change, abnormal bleeding, and enlarged lymph nodes.     Vital Signs:  Patient Profile:   65 Years Old Male Height:     74 inches Weight:      155.13 pounds BMI:     19.99 O2 Sat:      100 % O2 treatment:    Room Air Temp:     97.5 degrees F oral Pulse rate:   48 / minute BP sitting:   120 / 70  (left arm) Cuff size:   regular  Pt. in pain?   no  Vitals Entered By: Clarise Cruz Duncan Dull) (November 30, 2007 10:28 AM)                  Physical Exam  GENERAL:  A/Ox3; pleasant & cooperative.NAD HEENT:  /AT, EOM-wnl, PERRLA, EACs-clear, TMs-wnl, NOSE-clear, THROAT-clear & wnl. NECK:  Supple w/ fair ROM; no JVD; normal carotid impulses w/o bruits; no thyromegaly or nodules palpated; no lymphadenopathy. CHEST:  Coarse BS bilaterally , no wheezing. HEART:  RRR, no m/r/g  heard ABDOMEN:  Soft & nt; nml bowel sounds; no organomegaly or masses detected. EXT: Warm bilat,  no calf pain, edema, clubbing, pulses intact Skin: no rash/lesion    CT of Sinus  Procedure date:  12/01/2007  Findings:  mild thickening diffusely   Pulmonary Function Test Height (in.): 74 Gender: Male    Impression & Recommendations:  Problem # 1:  COUGH (ICD-786.2) Sinus ct unimpressive.  Reflux symptoms dominating the present clinical picture would cough so hard that he vomits so clearly he is refluxing.  If not responding to maximum treatment directed acid reflux, the next step would be to add  Reglan to his regimen and then, and only then, consider a methacholine challenge test to exclude the possibility that he also has asthma.    Medications Added to Medication List This Visit: 1)  Mucinex Dm Maximum Strength 60-1200 Mg Tb12 (Dextromethorphan-guaifenesin) .[name].. 1 every 12 hours 2)  Prednisone 10 Mg Tabs  (Prednisone) .... 4 each am x 2days, 2x2days, 1x2days and stop  Complete Medication List: 1)  Allopurinol 100 Mg Tabs (Allopurinol) .... Once daily 2)  Lipitor 10 Mg Tabs (Atorvastatin calcium) .... Every other day 3)  Protonix 40 Mg Pack (Pantoprazole sodium) .... Take one 30-60 min before first and last meals of the day 4)  Mucinex Dm Maximum Strength 60-1200 Mg Tb12 (Dextromethorphan-guaifenesin) .[name].. 1 every 12 hours 5)  Prednisone 10 Mg Tabs (Prednisone) .... 4 each am x 2days, 2x2days, 1x2days and stop  Other Orders: Misc. Referral (Misc. Ref)   Patient Instructions: 1)  See Patient Care Coordinator before you leave today. 2)  Please schedule a follow-up appointment in 1 month. 3)  GERD (gastroesophageal reflux disease) was discussed. It is a common cause of respiratory symptoms. It commonly presents in the absence of heartburn. GERD can be treated with medication, but also with lifestyle changes including avoidance of late meals, excessive alcohol, smoking cessation, and avoid fatty foods, chocolate, peppermint, colas, red wine, and acidic juices such as orange juice.  4)  NO MINT OR MENTHOL PRODUCTS 5)  USE SUGARLESS CANDY INSTEAD (jolley ranchers)    Prescriptions: PREDNISONE 10 MG  TABS (PREDNISONE) 4 each am x 2days, 2x2days, 1x2days and stop  #14 x 0   Entered and Authorized by:   Nyoka Cowden MD   Signed by:   Nyoka Cowden MD on 11/30/2007   Method used:   Electron[facility] to ...       [id number]       [address]       [place], [place]  [address]       Ph: [phone] or [phone]       Fax: [phone]   RxID:   [id number] PROTONIX 40 MG  PACK (PANTOPRAZOLE SODIUM) Take one 30-60 min before first and last meals of the day  #68 x 2   Entered and Authorized by:   Nyoka Cowden MD   Signed by:   Nyoka Cowden MD on 11/30/2007   Method used:   Electronically sent to ...       [id number]       [address]       [place], [place]  [address]       Ph: [phone] or [phone]       Fax: [phone]   RxID:   [id number]  ]

## 2010-06-26 NOTE — Progress Notes (Signed)
Summary: refill for qvar, foradil  Phone Note Call from Patient   Caller: Spouse Call For: doc on call Summary of Call: wife says patient tried advair again and patient had chest pain. So, they dont want it anymore. She wants refill for foradil and qvar which [facility]  [facility] Initial call taken by: Kalman Shan MD,  December 04, 2008 4:18 PM  Follow-up for Phone Call        noted Follow-up by: [name] MD,  December 12, 2008 2:43 PM

## 2010-06-26 NOTE — Progress Notes (Signed)
Summary: returned call- results  Phone Note Call from Patient   Caller: Spouse Call For: Odeal Welden Summary of Call: returned call re: results call cell [phone]. would like a call back today. caller says her husband (pt.) took the msg and she doesn't know who called.  Initial call taken by: Tivis Ringer,  December 02, 2007 3:10 PM  Follow-up for Phone Call        Pt's spouse aware of CT results; Pt's spouse is concerned and would like to know where to go from here. Please advise. Call home number.  Follow-up by: Reynaldo Minium CMA,  December 02, 2007 3:35 PM  Additional Follow-up for Phone Call Additional follow up Details #1::        LMOM: add chlorpheniramine otc to present regimen and keep prev ov, see sooner if needed  Additional Follow-up by: Nyoka Cowden MD,  December 03, 2007 8:42 AM

## 2010-06-26 NOTE — Progress Notes (Signed)
Summary: pt to have procedure tomorrow- heart cath  Phone Note Call from Patient   Caller: Spouse Call For: Levi [name] Summary of Call: per spouse Levi [name]; pt is having a cardio cath tomorrow morning. caller wanted to make sure dr Knoxx Boeding was aware of this. spouse also wants to thank dr clamce for "suggesting in the first place" that pt have a cardio workup. if caller doesn't receive a call back from nurse or dr Shelle Iron, she will assume everything is fine re: pt having this procedure given his pulm issues. Levi [name] [phone] Initial call taken by: Tivis Ringer,  November 24, 2008 2:30 PM  Follow-up for Phone Call        [facility],  [name] is just an [place] for you. Aundra Millet Reynolds LPN  November 24, 7369 2:37 PM   Additional Follow-up for Phone Call Additional follow up Details #1::        noted.  pt ok to have cath.  await results. Additional Follow-up by: [name] MD,  November 25, 2008 9:11 AM

## 2010-06-26 NOTE — Miscellaneous (Signed)
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

## 2010-06-26 NOTE — Assessment & Plan Note (Signed)
Summary: Pulmonary/ start on Symbicort 80   Referred by:  [name] PCP:  meyers  Chief Complaint:  Acute visit today.  Pt c/o increased sob since early this am.  Pt went to ER this am and was txed with atrovent and albuterol neb tx.  This helped some and not much.  .  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 Gvien augmentin, prednisone and cough suppression regimen with mucinex dm and tramadol. protonix increased to two times a day and 100% better but only while on prednisone ,  already failed to improve on singulair and proventil as needed, with pft's supranormal documented  11/05/07 and subsequent sinus ct neg 12/01/07   7/20 woke up at usual hour and then began having trouble breathing without the cough on this occasion and went to emergency room where was given a nebulizer and is better now on return to office but states clearly that he was better even before the nebulizer was started. Denies excess sputum production or chest pain fevers chills sweats orthopnea PND or leg swelling           Updated Prior Medication List: ALLOPURINOL 100 MG  TABS (ALLOPURINOL) once daily LIPITOR 10 MG  TABS (ATORVASTATIN CALCIUM) every other day NEXIUM 40 MG  CPDR (ESOMEPRAZOLE MAGNESIUM) two times a day MUCINEX DM MAXIMUM STRENGTH 60-1200 MG  TB12 (DEXTROMETHORPHAN-GUAIFENESIN) 1 every 12 hours FLONASE 50 MCG/ACT  SUSP (FLUTICASONE PROPIONATE) 2 sprays each nostril once daily  Current Allergies (reviewed today): ! 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 Afeb with normal vital signs HEENT: nl dentition, turbinates, and orophanx. Nl external ear canals without cough reflex Neck without JVD/Nodes/TM Lungs clear to A and P bilaterally without cough on insp or exp maneuvers RRR no s3 or murmur or increase in P2 Abd soft and benign with nl excursion in the supine position. No bruits or organomegaly Ext warm without calf tenderness, cyanosis clubbing or edema Skin warm and dry without lesions        Impression & Recommendations:  Problem # 1:  INTRINSIC ASTHMA, UNSPECIFIED (ICD-493.10) his PFTs at baseline are perfectly normal but a.t this point the only definite response he has had his to  prednisone even while taking maximal PPI and being treated for rhinitis and having documented the absence of sinusitis by recent sinus CT scan in July of 09.  The differential diagnosis includes eosinophilic bronchitis and asthma-induced cough and dyspnea and noted ESRD failed Singulair and Proventil, although the failure to respond Proventil does not necessarily mean that he doesn't have asthma.  I therefore recommended a trial of Symbicort 80/4.5 to be used at two puffs b.i.d. perfectly rectally for a trial of two weeks and if not effective would stop it and consider a methacholine challenge test while on maximum doses of PPI plus Reglan to eliminate the possibility of both acid and non-acid reflux contributing to his symptoms prior to doing the methacholine challenge  I spent extra time with the  patient today explaining optimal mdi  technique.  This improved from  25-75%   Each maintenance medication was reviewed in detail including most importantly the difference between maintenance and as needed and under what circumstances the prns are to be used.  a short course of prednisone was also added to see if again he benefits in this time, while maintaining Symbicort, maintains the benefit of systemic prednisone, which he says is the only thing that helps him.     Medications Added to Medication List This Visit: 1)  Symbicort 80-4.5 Mcg/act Aero (Budesonide-formoterol fumarate) .... Two puffs in am and 2 puffs in pm 2)  Nexium 40 Mg Cpdr (Esomeprazole magnesium) .... Two times a day 3)  Flonase 50 Mcg/act Susp (Fluticasone propionate) .... 2 sprays each nostril once daily 4)  Tramadol Hcl 50 Mg Tabs (Tramadol hcl) .... One every 4 hours as needed for excess cough not responding to mucinex dm 5)  Prednisone 10 Mg Tabs (Prednisone) .... 4 each am x 2days, 2x2days, 1x2days and stop  Complete Medication List: 1)  Symbicort 80-4.5 Mcg/act Aero (Budesonide-formoterol fumarate) .... Two puffs in am and 2 puffs in pm 2)  Allopurinol 100 Mg Tabs (Allopurinol) .... Once daily 3)  Nexium 40 Mg Cpdr (Esomeprazole magnesium) .... Two times a day 4)  Lipitor 10 Mg Tabs (Atorvastatin calcium) .... Every other day 5)  Flonase 50 Mcg/act Susp (Fluticasone propionate) .... 2 sprays each nostril once daily 6)  Mucinex Dm Maximum Strength 60-1200 Mg Tb12 (Dextromethorphan-guaifenesin) .[name].. 1 every 12 hours 7)  Tramadol Hcl 50 Mg Tabs (Tramadol hcl) .... One every 4 hours as needed for excess cough not responding to mucinex dm 8)  Prednisone 10 Mg Tabs (Prednisone) .... 4 each am x 2days, 2x2days, 1x2days and stop   Patient Instructions: 1)  Work on technique for inhaler 2)  keep previous appointment   Prescriptions: PREDNISONE 10 MG  TABS (PREDNISONE) 4 each am x 2days, 2x2days, 1x2days and stop  #14  x 0   Entered and Authorized by:   Nyoka Cowden MD   Signed by:   Nyoka Cowden MD on 12/14/2007   Method used:   [facility] ...       [id number]       [address]       [place], [place]  [address]       Ph: [phone] or [phone]       Fax: [phone]   RxID:   [id number]  ]

## 2010-06-26 NOTE — Miscellaneous (Signed)
Summary: Injection Record/[facility]  Injection Record/[facility]   Imported By: Lanelle Bal 09/27/2009 13:50:56  _____________________________________________________________________  External Attachment:    Type:   Image     Comment:   External Document

## 2010-06-26 NOTE — Progress Notes (Signed)
Summary: speak to Levi/ appeal  Phone Note Call from Patient Call back at Home Phone [phone]   Caller: Spouse Call For: [name] Summary of Call: requests to speak to Levi re: what she recently found out about pt's appeal.  Initial call taken by: Tivis Ringer,  March 23, 2009 12:05 PM  Follow-up for Phone Call        Called wife and she has recvd response from her letter. She will fax over letter along with her letter for review. Levi [name]  March 23, 2009 5:49 PM  Spoke with Dr. Shelle Iron and he stated that there was not any new information to be submitted. Per Dr. Shelle Iron, they need to take contact [employer] to inquiry as to why he was denied xolair when he meets every single cateria for [employer]. Advised wife of above and she stated that she would write another letter and see what they say. Advised wife that if I could be of any help to contact me. Levi [name]  March 24, 2009 4:06 PM

## 2010-06-26 NOTE — Assessment & Plan Note (Signed)
Summary: rov for asthma   Copy to:  [facility] Primary Provider/Referring Provider:  [name]  CC:  Followup for back surgery clearance.  Back surgery sched with Dr Yevette Edwards for 07/05/09.  Pt states that his breathing has been okay and denies any complaints today.[name]  History of Present Illness: The pt comes in today for f/u of his asthma, and also pulmonary clearance for upcoming back surgery.  He is doing well from an asthma standpoint, and has not had any significant exacerbation recently.  He is taking allergy shots, and feels his breathing has been doing well.  He is continuing his exercise program.  Current Medications (verified): 1)  Allopurinol 100 Mg  Tabs (Allopurinol) .... Once Daily 2)  Protonix 40 Mg  Tbec (Pantoprazole Sodium) .... Take 1 Tablet By Mouth Two Times A Day 3)  Lipitor 10 Mg  Tabs (Atorvastatin Calcium) .... Take 1 Tablet By Mouth Once A Day 4)  Mucinex Dm Maximum Strength 60-1200 Mg  Tb12 (Dextromethorphan-Guaifenesin) .[name].. 1 Every 12 Hours 5)  Maxair Autohaler 200 Mcg/inh Aerb (Pirbuterol Acetate) .... Inhale 2 Puffs Every 6 Hours As Needed 6)  Qvar 80 Mcg/act Aers (Beclomethasone Dipropionate) .... Inhale 2 Puffs Two Times A Day 7)  Foradil Aerolizer 12 Mcg Caps (Formoterol Fumarate) .... Inhale Contents of 1 Cap Two Times A Day 8)  Prednisone 10 Mg  Tabs (Prednisone) .... Take 1 Tablet By Mouth Once A Day 9)  Allergy Vaccine [facility] [employer]  Allergies (verified): 1)  ! Levaquin  Review of Systems      See HPI  Vital Signs:  Patient profile:   65 year old male Weight:      160.38 pounds O2 Sat:      95 % on Room air Temp:     98.1 degrees F oral Pulse rate:   72 / minute BP sitting:   130 / 70  (left arm)  Vitals Entered By: [name] (June 13, 2009 9:34 AM)  O2 Flow:  Room air  Physical Exam  General:  thin male in nad Lungs:  totally clear to auscultation. Heart:  rrr Extremities:  no edema or cyanosis   Impression &  Recommendations:  Problem # 1:  ASTHMA (ICD-493.90) the pt is doing well on his current meds and allergy shots.  He is still on prednisone chronically, and the next goal will be to try and get him off this.  Will wait until after his surgery.  I do not anticipate any pulmonary issues with his upcoming surgery.  Other Orders: Est. Patient Level II (17510)  Patient Instructions: 1)  no change in meds. 2)  you are cleared from a pulmonary standpoint for back surgery. 3)  followup with me in 4mos or sooner if problems.  Prescriptions: PREDNISONE 10 MG  TABS (PREDNISONE) Take 1 tablet by mouth once a day  #30 x 6   Entered and Authorized by:   [name] MD   Signed by:   [name] MD on 06/13/2009   Method used:   Print then Give to Patient   RxID:   [id number] FORADIL AEROLIZER 12 MCG CAPS (FORMOTEROL FUMARATE) inhale contents of 1 cap two times a day  #60 x 6   Entered and Authorized by:   [name] MD   Signed by:   [name] MD on 06/13/2009   Method used:   Print then Give to Patient   RxID:   [id number] QVAR 80  MCG/ACT AERS (BECLOMETHASONE DIPROPIONATE) Inhale 2 puffs two times a day  #1 x 6   Entered and Authorized by:   [name] MD   Signed by:   [name] MD on 06/13/2009   Method used:   Print then Give to Patient   RxID:   [id number]

## 2010-06-26 NOTE — Miscellaneous (Signed)
Summary: Skin Test/[facility]  Skin Test/[facility]   Imported By: Sherian Rein 04/07/2009 14:35:53  _____________________________________________________________________  External Attachment:    Type:   Image     Comment:   External Document

## 2010-06-26 NOTE — Progress Notes (Signed)
Summary: cataract surgery  Phone Note Call from Patient Call back at Home Phone [phone]   Caller: Spouse--alison Call For: clance Reason for Call: Talk to Nurse Summary of Call: Patient saw Dr. Shelle Iron today, but forgot to mention that he is scheduled for cataract surgery 11/21 and wanted to make sure this is okay w/ Dr. Shelle Iron. Initial call taken by: Lehman Prom,  April 02, 2010 10:39 AM  Follow-up for Phone Call        Dr. Shelle Iron please advise if patient can have cataract surgery. Michel Bickers [facility]  April 02, 2010 10:42 AM  Additional Follow-up for Phone Call Additional follow up Details #1::        no pulmonary issue should interfere with cataract surgery Additional Follow-up by: [name] MD,  April 02, 2010 11:13 AM    Additional Follow-up for Phone Call Additional follow up Details #2::    The patient and spouse are aware that thee are no pulmonary issues that should interfere with him having cataract surgery.Michel Bickers [facility]  April 02, 2010 11:34 AM

## 2010-06-26 NOTE — Assessment & Plan Note (Signed)
Summary: rov for cramping, ?med intolerance.   Copy to:  [facility] Primary Provider/Referring Provider:  [name]  CC:  Pt is here for a f/u appt.  Pt has stopped taking Advair d/t "body cramps."  Pt states occ. increased sob due to weather and humidity.  Pt also c/o L sided back pain and wonders if this is due to his lungs.  .  History of Present Illness: The pt comes in today for f/u of his asthma, and also for an acute visit to discuss possible side effects from his medications.  The pt believes that advair is causing muscle/body cramping, and recently discontinued this.  He had the same issues with symbicort.  While I was out of town, my partner checked various electrolytes, and found them to be normal.  The pt exercises very strenously, and wears "rubber clothing" with long sleeves and sweat pants.  He states that he sweats profusely with exercise, and always has.  He denies any breathing issues at this time.  He also describes atypical chest pain which radiates up to jaw while exercising as well.  He has had no recent cardiac work up.  Current Medications (verified): 1)  Allopurinol 100 Mg  Tabs (Allopurinol) .... Once Daily 2)  Protonix 40 Mg  Tbec (Pantoprazole Sodium) .... Take 1 Tablet By Mouth Two Times A Day 3)  Lipitor 10 Mg  Tabs (Atorvastatin Calcium) .... Every Other Day 4)  Flonase 50 Mcg/act  Susp (Fluticasone Propionate) .[name].. 1 Spray in Each Nostril Two Times A Day 5)  Mucinex Dm Maximum Strength 60-1200 Mg  Tb12 (Dextromethorphan-Guaifenesin) .[name].. 1 Every 12 Hours 6)  Maxair Autohaler 200 Mcg/inh Aerb (Pirbuterol Acetate) .... Inhale 2 Puffs Every 6 Hours As Needed 7)  Prednisone 10 Mg  Tabs (Prednisone) .... One Each Day  Allergies (verified): 1)  ! Morphine  Review of Systems       The patient complains of chest pain.  The patient denies shortness of breath with activity, shortness of breath at rest, productive cough, non-productive cough, coughing up blood, irregular  heartbeats, acid heartburn, indigestion, loss of appetite, weight change, abdominal pain, difficulty swallowing, sore throat, tooth/dental problems, headaches, nasal congestion/difficulty breathing through nose, sneezing, itching, ear ache, anxiety, depression, hand/feet swelling, joint stiffness or pain, rash, change in color of mucus, and fever.    Vital Signs:  Patient profile:   65 year old male Height:      74 inches Weight:      155.50 pounds BMI:     20.04 O2 Sat:      97 % on Room air Temp:     97.7 degrees F oral Pulse rate:   69 / minute BP sitting:   118 / 80  (right arm) Cuff size:   regular  Vitals Entered By: Arman Filter LPN (November 15, 2008 9:07 AM)  O2 Flow:  Room air CC: Pt is here for a f/u appt.  Pt has stopped taking Advair d/t "body cramps."  Pt states occ. increased sob due to weather and humidity.  Pt also c/o L sided back pain and wonders if this is due to his lungs.   Comments Medications reviewed with patient Arman Filter LPN  November 15, 2008 9:07 AM    Physical Exam  General:  thin male in nad Lungs:  totally clear to auscultation, with no wheezing or rhonchi Heart:  rrr Extremities:  no edema or calf tenderness.   Impression & Recommendations:  Problem # 1:  INTRINSIC ASTHMA, UNSPECIFIED (ICD-493.10) the pt seems to be doing well from an asthma standpoint, but he needs to get back on some type of therapy.  I suspect his "cramping" is more due to his loss of fluids/electrolytes during his heavy exertion rather than his inhalers.  Will start him back on qvar and foradil to see if he does any better.  We are awaiting approval of his xolair, so that we can try to get him off chronic prednisone therapy.  Problem # 2:  CHEST PAIN, ATYPICAL (ICD-786.59) along with profuse sweating.  I suspect the patient does not have coronary artery disease, but I am concerned about his unexplained symptomatology.  I have explained to him even patients in "good physical  condition" can have heart attacks.  I will leave the decision to consider noninvasive cardiac w/u to the patient and his primary physician.  I will send a copy of this note to Dr. Izola Price.  Other Orders: Est. Patient Level III (25956)  Patient Instructions: 1)  stay off advair.  Will start foradil one inhalation each am and pm, along with qvar 80 2 puffs in am and pm.  Rinse mouth well. 2)  try working out without sweats/rubber shirts. 3)  will let you know when xolair available. 4)  follow up with me in 2mos or sooner if problems.

## 2010-06-26 NOTE — Assessment & Plan Note (Signed)
Summary: rov for asthma   Primary Provider/Referring Provider:  [name]  CC:  Pt is here for a f/u appt.  Pt is currently on 10mg  of Prednisone.  Pt states breathing is "good."  Pt denied any new complaints. .  History of Present Illness: The pt comes in today for f/u of his severe asthma with allergic component.  Overall, he feels that he is doing well.  He has not had any recent flareups, and is not having to overuse rescue inhaler.  He has been staying active, and is satisfied with his exercise tolerance.  No significant cough or mucus at this time.   Current Medications (verified): 1)  Allopurinol 100 Mg  Tabs (Allopurinol) .... Once Daily 2)  Protonix 40 Mg  Tbec (Pantoprazole Sodium) .... Take 1 Tablet By Mouth Two Times A Day 3)  Lipitor 10 Mg  Tabs (Atorvastatin Calcium) .... Take 1 Tablet By Mouth Once A Day 4)  Mucinex Dm Maximum Strength 60-1200 Mg  Tb12 (Dextromethorphan-Guaifenesin) .[name].. 1 Every 12 Hours 5)  Maxair Autohaler 200 Mcg/inh Aerb (Pirbuterol Acetate) .... Inhale 2 Puffs Every 6 Hours As Needed 6)  Qvar 80 Mcg/act Aers (Beclomethasone Dipropionate) .... Inhale 2 Puffs Two Times A Day 7)  Foradil Aerolizer 12 Mcg Caps (Formoterol Fumarate) .... Inhale Contents of 1 Cap Two Times A Day 8)  Prednisone 10 Mg  Tabs (Prednisone) .... Take 1 Tablet By Mouth Once A Day  Allergies (verified): 1)  ! Morphine 2)  ! Levaquin  Review of Systems      See HPI  Vital Signs:  Patient profile:   65 year old male Height:      74 inches Weight:      158 pounds BMI:     20.36 O2 Sat:      98 % on Room air Temp:     98.1 degrees F oral Pulse rate:   110 / minute BP sitting:   98 / 60  (left arm) Cuff size:   regular  Vitals Entered By: Arman Filter LPN (April 17, 2009 9:35 AM)  O2 Flow:  Room air CC: Pt is here for a f/u appt.  Pt is currently on 10mg  of Prednisone.  Pt states breathing is "good."  Pt denied any new complaints.  Comments Medications reviewed with  patient Arman Filter LPN  April 17, 2009 9:35 AM    Physical Exam  General:  thin male in nad Lungs:  totally clear to auscultation no wheezing or rhonchi Heart:  rrr Extremities:  no edema   Impression & Recommendations:  Problem # 1:  ASTHMA (ICD-493.90) the pt is doing well on his current regimen, however he is on prednisone at 10mg  a day.  The pt is interested in starting on allergy shots, and I support this in order to try and get him off chronic steroids.  Will continue current inhaler regimen, and work on prednisone taper once he has been on his allergy vaccine for a period of time.  Medications Added to Medication List This Visit: 1)  Prednisone 10 Mg Tabs (Prednisone) .... Take 1 tablet by mouth once a day 2)  Alprazolam 0.25 Mg Tabs (Alprazolam) .... One every 12hrs if needed  Other Orders: Est. Patient Level II (04540)  Patient Instructions: 1)  schedule apptm with Dr. Maple Hudson to start allergy vaccine 2)  no change in current meds.  Once you start allergy therapy, I would work toward getting you off prednisone. 3)  no change in  asthma meds. 4)  followup with me in 4 mos, but call once you have been on allergy shots for about 6weeks and we can start steroid weaning.  Prescriptions: ALPRAZOLAM 0.25 MG TABS (ALPRAZOLAM) one every 12hrs if needed  #20 x 0   Entered and Authorized by:   [name] MD   Signed by:   [name] MD on 04/17/2009   Method used:   Print then Give to Patient   RxID:   [id number]

## 2010-06-26 NOTE — Miscellaneous (Signed)
Summary: Injection Record / [facility]    Injection Record / [facility]    Imported By: Lennie Odor 01/26/2010 15:08:57  _____________________________________________________________________  External Attachment:    Type:   Image     Comment:   External Document

## 2010-06-26 NOTE — Letter (Signed)
Summary: Surgical clearance/[facility]  Surgical clearance/[facility]   Imported By: Lester [place] 06/13/2009 10:10:05  _____________________________________________________________________  External Attachment:    Type:   Image     Comment:   External Document

## 2010-06-26 NOTE — Miscellaneous (Signed)
Summary: RAST/[facility]  [facility]   Imported By: Sherian Rein 03/17/2009 09:19:42  _____________________________________________________________________  External Attachment:    Type:   Image     Comment:   External Document

## 2010-06-26 NOTE — Assessment & Plan Note (Signed)
Summary: ALLERGY CONSULT/// KP   Vital Signs:  Patient profile:   65 year old male Height:      74 inches Weight:      160 pounds BMI:     20.62 O2 Sat:      97 % on Room air Pulse rate:   73 / minute BP sitting:   120 / 64  (left arm)  Vitals Entered By: Renold Genta RCP, LPN (March 06, 2009 2:53 PM)  O2 Sat at Rest %:  96% O2 Flow:  Room air CC: Allergy Consult referred by Dr. Shelle Iron Comments Medications reviewed with patient Renold Genta RCP, LPN  March 06, 2009 2:53 PM    Copy to:  Jodi Geralds Primary Provider/Referring Provider:  [name]  CC:  Allergy Consult referred by Dr. Shelle Iron.  History of Present Illness: 03/06/09- 62 yoM seen in Allergy consultation on kind referral by Dr Shelle Iron, with question of Xolair candidacy. He has minimal smoking hx and quit 40 years ago, but carries a dx of asthma/ copd. Has had some itching of eyes and says chest tightness and wheeze are worst in Spring and Fall.  RAST testing 01/29/08- IgE 364, with specific elevations for mite, grass, cockroach, trees. Rhinitis and itching eyes increased when he moved here 4 years ago. For the last 2 years symptoms have extended March til September and he has noted dyspnea, wheeze and chest tightness. Also reports nasal septal perforation with"mild sinus". PFTs reviewed.  Allergies: 1)  ! Morphine 2)  ! 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    Risk Factors: Smoking Status: quit (11/01/2008)  Past Medical History: severe asthma  Review of Systems   The patient complains of non-productive cough, acid heartburn, abdominal pain, difficulty swallowing, tooth/dental problems, sneezing, itching, joint stiffness or pain, and rash.    Physical Exam  Additional Exam:  General: A/Ox3; pleasant and cooperative, NAD, SKIN: no rash, lesions NODES: no lymphadenopathy HEENT: [place]/AT, EOM- WNL, Conjuctivae- clear, PERRLA, TM-WNL, Nose- clear, Throat- clear and wnl, Melampatti II, caps on teeth NECK: Supple w/ fair ROM, JVD- none, normal carotid impulses w/o bruits Thyroid- normal to palpation CHEST: Clear to P&A HEART: RRR, no m/g/r heard ABDOMEN: Soft and nl; nml bowel sounds; no organomegaly or masses noted NWG:NFAO, nl pulses, no edema  NEURO: Grossly intact to observation      Impression & Recommendations:  Problem # 1:  ASTHMA (ICD-493.90) He is atopic based on IgE and in vitro testing done elsewhere a year ago, but it isn't yet clear how this relates to his repiratory problems. Note for interest his exposure to the dusts of 9/11in [place]. We can interrogate his immune system with skin testing and see how plausible a trial of allergy vaccine may be, consistent with the question raised by Dr Shelle Iron.  Other Orders: Consultation Level IV (13086)  Patient Instructions: 1)  Schedule return for allergy skin testing as able- Stop all antihistamines 3 days before skin testing, including cold and allergy meds, otc sleep and cough meds.

## 2010-06-26 NOTE — Assessment & Plan Note (Signed)
Summary: sob/ mbw   Copy to:  Levi [name] Primary Provider/Referring Provider:  [name]  CC:  increased SOB x2days.  states occurs in episodes..  History of Present Illness: 65 yo male with known history of Asthma.   12/22/08- Presents for acute office visit. Complains of increased SOB x2days.  states occurs in episodes, and rescue inhaler only last for short time. On chronic steroids, decreased down 15mg  symptms began to worsen, then went up to 25mg  today. Minimal wheeizng, dry cough. Denies chest pain, orthopnea, hemoptysis, fever, n/v/d, edema, headache. Since last visit, has been started on low dose ace inhibitor for heart protection. He has a hx of recurrent cough.   Medications Prior to Update: 1)  Allopurinol 100 Mg  Tabs (Allopurinol) .... Once Daily 2)  Protonix 40 Mg  Tbec (Pantoprazole Sodium) .... Take 1 Tablet By Mouth Two Times A Day 3)  Lipitor 10 Mg  Tabs (Atorvastatin Calcium) .... Every Other Day 4)  Flonase 50 Mcg/act  Susp (Fluticasone Propionate) .[name].. 1 Spray in Each Nostril Two Times A Day 5)  Mucinex Dm Maximum Strength 60-1200 Mg  Tb12 (Dextromethorphan-Guaifenesin) .[name].. 1 Every 12 Hours 6)  Maxair Autohaler 200 Mcg/inh Aerb (Pirbuterol Acetate) .... Inhale 2 Puffs Every 6 Hours As Needed 7)  Prednisone 10 Mg  Tabs (Prednisone) .... Take 4 Tabs By Mouth Daily 8)  Qvar 80 Mcg/act Aers (Beclomethasone Dipropionate) .... Inhale 2 Puffs Two Times A Day 9)  Foradil Aerolizer 12 Mcg Caps (Formoterol Fumarate) .... Inhale Contents of 1 Cap Two Times A Day 10)  Prednisone 10 Mg  Tabs (Prednisone) .... As Directed  Current Medications (verified): 1)  Allopurinol 100 Mg  Tabs (Allopurinol) .... Once Daily 2)  Protonix 40 Mg  Tbec (Pantoprazole Sodium) .... Take 1 Tablet By Mouth Two Times A Day 3)  Lipitor 10 Mg  Tabs (Atorvastatin Calcium) .... Take 1 Tablet By Mouth Once A Day 4)  Flonase 50 Mcg/act  Susp (Fluticasone Propionate) .[name].. 1 Spray in Each Nostril Two Times A Day  When Needed 5)  Mucinex Dm Maximum Strength 60-1200 Mg  Tb12 (Dextromethorphan-Guaifenesin) .[name].. 1 Every 12 Hours 6)  Maxair Autohaler 200 Mcg/inh Aerb (Pirbuterol Acetate) .... Inhale 2 Puffs Every 6 Hours As Needed 7)  Qvar 80 Mcg/act Aers (Beclomethasone Dipropionate) .... Inhale 2 Puffs Two Times A Day 8)  Foradil Aerolizer 12 Mcg Caps (Formoterol Fumarate) .... Inhale Contents of 1 Cap Two Times A Day 9)  Prednisone 10 Mg  Tabs (Prednisone) .... As Directed - Currently Taking 20mg  Daily 10)  Lisinopril 2.5 Mg Tabs (Lisinopril) .... 1/4 Tab By Mouth Once Daily  Allergies (verified): 1)  ! Morphine 2)  ! 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    Risk Factors: Smoking Status: quit (11/01/2008)  Review of Systems      See HPI  Vital Signs:  Patient profile:   65 year old male Height:      72 inches Weight:      161.50 pounds O2 Sat:  99 % on Room air Temp:     97.7 degrees F oral Pulse rate:   68 / minute BP sitting:   118 / 70  (left arm) Cuff size:   regular  Vitals Entered By: Boone Master CNA (December 22, 2008 3:56 PM)  O2 Flow:  Room air CC: increased SOB x2days.  states occurs in episodes. Is Patient Diabetic? No Comments Medications reviewed with patient Daytime contact number verified with patient.  Boone Master CNA  December 22, 2008 3:59 PM    Physical Exam  Additional Exam:  GEN: A/Ox3; pleasant, appears anxious  HEENT:  [place]/AT, , EACs-clear, TMs-wnl,  NOSE-clear, THROAT-clear NECK:  Supple w/ fair ROM; no JVD; normal carotid impulses w/o bruits; no thyromegaly or nodules palpated; no lymphadenopathy. RESP  Coarse BS w/ no wheezing. CARD:  RRR, no m/r/g   GI:   Soft & nt; nml bowel sounds; no organomegaly or masses detected. [place]: Warm bil,  no calf tenderness edema, clubbing, pulses intact Neuro: intact no focal deficits.    Impression & Recommendations:  Problem # 1:  INTRINSIC ASTHMA, UNSPECIFIED (ICD-493.10) Flare:  pt has history of recurrent cough, suspect ace inhibitor not ideal choice for this pt due to potential for upper airway irritation/cough.  REC:  Hold prednisone at 20mg  once daily until seen back in office.  Xanax 0.25mg  1/2-1 by mouth two times a day as needed anxiety (?anxiety component along w/ steroids).  Stop lisinopril.  follow up Dr. Shelle Iron 1 month as scheduled.  Please contact office for sooner follow up if symptoms do not improve or worsen  Will fax note to primary regarding d/c of ace.   Medications Added to Medication List This Visit: 1)  Lipitor 10 Mg Tabs (Atorvastatin calcium) .... Take 1 tablet by mouth once a day 2)  Flonase 50 Mcg/act Susp (Fluticasone propionate) .[name].. 1 spray in each nostril two times a day when needed 3)  Prednisone 10 Mg Tabs (Prednisone) .... As directed - currently taking 20mg  daily 4)  Lisinopril 2.5 Mg Tabs (Lisinopril) .... 1/4 tab by mouth once daily 5)  Alprazolam 0.25 Mg Tabs (Alprazolam) .... 1/2 -1 by mouth two times a day as needed anxiety  Complete Medication List: 1)  Allopurinol 100 Mg Tabs (Allopurinol) .... Once daily 2)  Protonix 40 Mg Tbec (Pantoprazole sodium) .... Take 1 tablet by mouth two times a day 3)  Lipitor 10 Mg Tabs (Atorvastatin calcium) .... Take 1 tablet by mouth once a day 4)  Flonase 50 Mcg/act Susp (Fluticasone propionate) .[name].. 1 spray in each nostril two times a day when needed 5)  Mucinex Dm Maximum Strength 60-1200 Mg Tb12  (Dextromethorphan-guaifenesin) .[name].. 1 every 12 hours 6)  Maxair Autohaler 200 Mcg/inh Aerb (Pirbuterol acetate) .... Inhale 2 puffs every 6 hours as needed 7)  Qvar 80 Mcg/act Aers (Beclomethasone dipropionate) .... Inhale 2 puffs two times a day 8)  Foradil Aerolizer 12 Mcg Caps (Formoterol fumarate) .... Inhale contents of 1 cap two times a day 9)  Prednisone 10 Mg Tabs (Prednisone) .... As directed - currently taking 20mg  daily 10)  Alprazolam 0.25 Mg Tabs (Alprazolam) .... 1/2 -1 by mouth two times a day as needed anxiety  Other Orders: Est. Patient Level III (04540)  Patient Instructions: 1)  Hold prednisone at 20mg  once daily until seen back in office.  2)  Xanax 0.25mg  1/2-1 by mouth two times a day as needed anxiety 3)  Stop lisinopril.  4)  follow up Dr. Shelle Iron  1 month as scheduled.  5)  Please contact office for sooner follow up if symptoms do not improve or worsen  Prescriptions: ALPRAZOLAM 0.25 MG TABS (ALPRAZOLAM) 1/2 -1 by mouth two times a day as needed anxiety  #60 x 0   Entered and Authorized by:   Rubye Oaks NP   Signed by:   Trayon Krantz NP on 12/22/2008   Method used:   Print then Give to Patient   RxID:   [id number]   Appended Document: sob/ mbw [place] faxed to Dr. Ronald Lobo. JWR  Appended Document: sob/ mbw The office of Dr. Nelle Don called to inform faxed to wrong office. Asked Dr. Shelle Iron pt's PCP, Dr. Shelle Iron advised Dr. Mosetta Putt. OV faxed to same. JWR

## 2010-06-26 NOTE — Assessment & Plan Note (Signed)
Summary: acute sick visit for uri, asthma   Copy to:  [facility] Primary Provider/Referring Provider:  [name]  CC:  Pt is here for an acute sick visit. Pt believes he may have a "cold."  Pt c/o mild increased sob with exertion and at rest.  Pt c/o coughing up green sputum, sore throat, and and head congestion.  pt states peak flow meter readings are down from 500 to now 390.    Levi [name]  History of Present Illness: The pt comes in today for an acute sick visit.  He has known severe asthma with significant allergic component, and has been doing well with an aggressive bronchodilator regimen and allergy therapy.  He comes in today with URI symptoms of nasal congestion, rhinorrhea, sore throat, and postnasal drip with discolored mucus.  He has not had any f/c/s.  He does not feel congested, nor is he bringing up mucus from his chest.  He has noted a little bit more sob, and his peak flows have decreased.  Allergies: 1)  ! Levaquin  Review of Systems       The patient complains of shortness of breath with activity, shortness of breath at rest, productive cough, sore throat, nasal congestion/difficulty breathing through nose, and change in color of mucus.  The patient denies non-productive cough, coughing up blood, chest pain, irregular heartbeats, acid heartburn, indigestion, loss of appetite, weight change, abdominal pain, difficulty swallowing, tooth/dental problems, headaches, sneezing, itching, ear ache, anxiety, depression, hand/feet swelling, joint stiffness or pain, rash, and fever.    Vital Signs:  Patient profile:   65 year old male Height:      74 inches Weight:      164 pounds BMI:     21.13 O2 Sat:      97 % on Room air Temp:     97.7 degrees F oral Pulse rate:   67 / minute BP sitting:   110 / 60  (left arm) Cuff size:   regular  Vitals Entered By: Arman Filter LPN (July 05, 2009 11:51 AM)  O2 Flow:  Room air CC: Pt is here for an acute sick visit. Pt believes he  may have a "cold."  Pt c/o mild increased sob with exertion and at rest.  Pt c/o coughing up green sputum, sore throat, and head congestion.  pt states peak flow meter readings are down from 500 to now 390.     Comments Medications reviewed with patient Arman Filter LPN  July 05, 2009 11:51 AM    Physical Exam  General:  thin male in nad Nose:  no purulence noted. Lungs:  clear to auscultation, no wheezing or rhonchi Heart:  rrr Extremities:  no edema or cyanosis Neurologic:  alert and oriented,moves all 4.   Impression & Recommendations:  Problem # 1:  VIRAL URI (ICD-465.9)  I suspect he has a viral URI, but I cannot 100% exclude a bacterial process.  Given the severity of his lung disease, and his risk for rapid deterioration, will give him a prescription for an abx to hold in the event he gets worse and this is not self limiting.  He is to also start if any temp above 100.5.  Problem # 2:  ASTHMA (ICD-493.90) His chest is clear currently, but I have explained that he will notice restriction to airflow and his peak flows will fall, well before I begin to hear changes in his lung exam.  I have instructed him on how to escalate  his prednisone dosing if his breathing worsens.  He will let me know how things are going.  Medications Added to Medication List This Visit: 1)  Cefdinir 300 Mg Caps (Cefdinir) .... 2 by mouth each am for 5 days  Other Orders: Est. Patient Level III (16109)  Patient Instructions: 1)  would treat conservatively first with fluids, tylenol cold and sinus (is ok even with your asthma). 2)  will give you prescription for an antibiotic, but do not fill unless you feel your congestion and mucus is worsening. 3)  If breathing is worsening, increase prednisone to 40mg  for 2 days, then 30mg  for 2 days, then 20mg  for 2 days, then back to your once a day.   Prescriptions: CEFDINIR 300 MG CAPS (CEFDINIR) 2 by mouth each am for 5 days  #10 x 0   Entered and  Authorized by:   [name] MD   Signed by:   [name] MD on 07/05/2009   Method used:   Print then Give to Patient   RxID:   [id number]

## 2010-06-26 NOTE — Assessment & Plan Note (Signed)
Summary: f/u on allergy vaccines per CY/mg   Copy to:  [facility] Primary Provider/Referring Provider:  [name]  CC:  Follow up visit-allergies;having back surgery in a few weeks and concerned about what to do about allergy shots..  History of Present Illness: History of Present Illness: 03/06/09- 62 yoM seen in Allergy consultation on kind referral by Dr Shelle Iron, with question of Xolair candidacy. He has minimal smoking hx and quit 40 years ago, but carries a dx of asthma/ copd. Has had some itching of eyes and says chest tightness and wheeze are worst in Spring and Fall.  RAST testing 01/29/08- IgE 364, with specific elevations for mite, grass, cockroach, trees. Rhinitis and itching eyes increased when he moved here 4 years ago. For the last 2 years symptoms have extended March til September and he has noted dyspnea, wheeze and chest tightness. Also reports nasal septal perforation with"mild sinus". PFTs re  March 29, 2009- Asthma/ COPD For allergy skin testing. No change off antihistamines. He feels better with the cooler weather, and feels well today. He was hosp 2 weeks ago for chest pain- bilateral ribs. Cardiac rulled out, but gave generic Imdur to hold for as needed. This is chronic recurrent pain.  I reconfirmed with Dr Shelle Iron that insurance is not willing to cover Xolair. SKin Test- Grass, tree, Dust/ mite.  June 28, 2009- Asthma, COPD Building allergy vaccine here without problems. in the last week throat scratchy- likely catching a cold.  Scheduled for lumbar spine surgery next week. We discussed how vaccine will be held, adjusted then rebuilt after he is mobile again.   Current Medications (verified): 1)  Allopurinol 100 Mg  Tabs (Allopurinol) .... Once Daily 2)  Protonix 40 Mg  Tbec (Pantoprazole Sodium) .... Take 1 Tablet By Mouth Two Times A Day 3)  Lipitor 10 Mg  Tabs (Atorvastatin Calcium) .... Take 1 Tablet By Mouth Once A Day 4)  Mucinex Dm Maximum  Strength 60-1200 Mg  Tb12 (Dextromethorphan-Guaifenesin) .[name].. 1 Every 12 Hours 5)  Maxair Autohaler 200 Mcg/inh Aerb (Pirbuterol Acetate) .... Inhale 2 Puffs Every 6 Hours As Needed 6)  Qvar 80 Mcg/act Aers (Beclomethasone Dipropionate) .... Inhale 2 Puffs Two Times A Day 7)  Foradil Aerolizer 12 Mcg Caps (Formoterol Fumarate) .... Inhale Contents of 1 Cap Two Times A Day 8)  Prednisone 10 Mg  Tabs (Prednisone) .... Take 1 Tablet By Mouth Once A Day 9)  Allergy Vaccine [facility] [employer] [facility]  Allergies (verified): 1)  ! 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    Risk Factors: Smoking Status: quit (11/01/2008)  Review of Systems      See HPI  The patient denies anorexia, fever, weight loss, weight gain, vision loss, decreased hearing, hoarseness, chest pain, syncope, dyspnea on exertion, peripheral edema, prolonged cough, headaches, hemoptysis, and severe indigestion/heartburn.    Vital Signs:  Patient profile:   65 year old male Height:      74 inches Weight:      163 pounds BMI:  21.00 O2 Sat:      97 % on Room air Pulse rate:   65 / minute BP sitting:   110 / 58  (left arm) Cuff size:   regular  Vitals Entered By: Reynaldo Minium CMA (June 28, 2009 10:53 AM)  O2 Flow:  Room air  Physical Exam  Additional Exam:  General: A/Ox3; pleasant and cooperative, NAD, thin SKIN: no rash, lesions NODES: no lymphadenopathy HEENT: [place]/AT, EOM- WNL, Conjuctivae- clear, PERRLA, TM-WNL,  Nose- clear, Throat- clear and wnl, Melampatti II, caps on teeth, minor scratchy voice NECK: Supple w/ fair ROM, JVD- none, normal carotid impulses w/o bruits Thyroid- normal to palpation CHEST: Clear to P&A HEART: RRR, no m/g/r heard ABDOMEN: Soft and nl;  UJW:JXBJ, nl pulses, no edema  NEURO: Grossly intact to observation      Impression & Recommendations:  Problem # 1:  ASTHMA (ICD-493.90)  Good control currently. He will continue building allergy vaccine. Rigtht now symptoms are minimal and he can proceed with plans for back surgery. If he turns out to have a real cold that may need to be rescheduled.  Orders: Est. Patient Level III (47829)  Problem # 2:  COUGH (ICD-786.2) Assessment: Comment Only  Patient Instructions: 1)  Please schedule a follow-up appointment in 6 months. We will consider advancing the strengtrh of your allergy vaccine one last step at that time. 2)  Claritin would be fine if needed for mild sneeze and draianage.

## 2010-06-26 NOTE — Miscellaneous (Signed)
Summary: Field seismologist   Imported By: Sherian Rein 01/25/2010 08:49:03  _____________________________________________________________________  External Attachment:    Type:   Image     Comment:   External Document

## 2010-06-26 NOTE — Progress Notes (Signed)
Summary: rx  Phone Note Call from Patient Call back at Home Phone [phone]   Caller: Patient Call For: Levi [name] Reason for Call: Talk to Nurse Summary of Call: pt's ortho wants hime to start some anti inflamatory med.  Need to [facility] with [name] to make sure it doesn't couteract inhalers. Initial call taken by: [name],  January 04, 2010 2:02 PM  Follow-up for Phone Call        Spoke with pt's spouse.  She states that pt was seen by ortho this am and advised that he needs to be taking aleve "or some other anti-infammatory med" for joint pain.  She states that ortho suggested that pt check with [facility] to make sure this is okay and will not interact with pulm meds.  She also asks if there is any anti-inflammatory med that [name] could suggest. Pls advise thanks Follow-up by: [name],  January 04, 2010 2:33 PM  Additional Follow-up for Phone Call Additional follow up Details #1::        It will not interfere with inhalers.  Some asthma pt's have an aspirin and nsaid intolerance, but it is usually obvious (can make breathing worse).  Ok to take anti-inflammatory, but monitor his breathing .  let us know if problems Additional Follow-up by: [name] MD,  January 04, 2010 5:22 PM    Additional Follow-up for Phone Call Additional follow up Details #2::    called and spoke with pts wife and she is aware of [facility] recs of the anti inflammatory---wife will monitor pt and call for any problems [name] [name]  January 04, 2010 5:26 PM

## 2010-06-26 NOTE — Assessment & Plan Note (Signed)
Summary: allergy skin testing ///kp   Vital Signs:  Patient profile:   65 year old male Height:      74 inches Weight:      161.13 pounds BMI:     20.76 O2 Sat:      97 % on Room air Pulse rate:   60 / minute BP sitting:   120 / 80  (left arm) Cuff size:   regular  Vitals Entered By: Marijo File CMA (March 29, 2009 4:08 PM)  O2 Sat at Rest %:  97 O2 Flow:  Room air CC: allergy testing today   Copy to:  Jodi Geralds Primary Provider/Referring Provider:  [name]  CC:  allergy testing today.  History of Present Illness: CC:  Allergy Consult referred by Dr. Shelle Iron.  History of Present Illness: 03/06/09- 62 yoM seen in Allergy consultation on kind referral by Dr Shelle Iron, with question of Xolair candidacy. He has minimal smoking hx and quit 40 years ago, but carries a dx of asthma/ copd. Has had some itching of eyes and says chest tightness and wheeze are worst in Spring and Fall.  RAST testing 01/29/08- IgE 364, with specific elevations for mite, grass, cockroach, trees. Rhinitis and itching eyes increased when he moved here 4 years ago. For the last 2 years symptoms have extended March til September and he has noted dyspnea, wheeze and chest tightness. Also reports nasal septal perforation with"mild sinus". PFTs re  March 29, 2009- Asthma/ COPD For allergy skin testing. No change off antihistamines. He feels better with the cooler weather, and feels well today. He was hosp 2 weeks ago for chest pain- bilateral ribs. Cardiac rulled out, but gave generic Imdur to hold for as needed. This is chronic recurrent pain.  I reconfirmed with Dr Shelle Iron that insurance is not willing to cover Xolair. SKin Test- Grass, tree, Dust/ mite.   Medications Prior to Update: 1)  Allopurinol 100 Mg  Tabs (Allopurinol) .... Once Daily 2)  Protonix 40 Mg  Tbec (Pantoprazole Sodium) .... Take 1 Tablet By Mouth Two Times A Day 3)  Lipitor 10 Mg  Tabs (Atorvastatin Calcium) .... Take 1 Tablet By Mouth  Once A Day 4)  Mucinex Dm Maximum Strength 60-1200 Mg  Tb12 (Dextromethorphan-Guaifenesin) .[name].. 1 Every 12 Hours 5)  Maxair Autohaler 200 Mcg/inh Aerb (Pirbuterol Acetate) .... Inhale 2 Puffs Every 6 Hours As Needed 6)  Qvar 80 Mcg/act Aers (Beclomethasone Dipropionate) .... Inhale 2 Puffs Two Times A Day 7)  Foradil Aerolizer 12 Mcg Caps (Formoterol Fumarate) .... Inhale Contents of 1 Cap Two Times A Day 8)  Prednisone 10 Mg  Tabs (Prednisone) .... As Directed - Currently Taking 20mg  Daily  Current Medications (verified): 1)  Allopurinol 100 Mg  Tabs (Allopurinol) .... Once Daily 2)  Protonix 40 Mg  Tbec (Pantoprazole Sodium) .... Take 1 Tablet By Mouth Two Times A Day 3)  Lipitor 10 Mg  Tabs (Atorvastatin Calcium) .... Take 1 Tablet By Mouth Once A Day 4)  Mucinex Dm Maximum Strength 60-1200 Mg  Tb12 (Dextromethorphan-Guaifenesin) .[name].. 1 Every 12 Hours 5)  Maxair Autohaler 200 Mcg/inh Aerb (Pirbuterol Acetate) .... Inhale 2 Puffs Every 6 Hours As Needed 6)  Qvar 80 Mcg/act Aers (Beclomethasone Dipropionate) .... Inhale 2 Puffs Two Times A Day 7)  Foradil Aerolizer 12 Mcg Caps (Formoterol Fumarate) .... Inhale Contents of 1 Cap Two Times A Day 8)  Prednisone 10 Mg  Tabs (Prednisone) .... As Directed - Currently Taking 20mg  Daily  Allergies: 1)  ! Morphine 2)  ! Levaquin  Comments:  Nurse/Medical Assistant: The patient's medications and allergies were reviewed with the patient and were updated in the Medication and Allergy Lists.  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    Risk Factors: Smoking Status: quit (11/01/2008)  Review of Systems      See HPI       The patient complains of chest pain.  The patient denies anorexia, fever, weight loss, weight gain, vision loss, decreased hearing, hoarseness, and syncope.    Physical Exam  Additional Exam:  General: A/Ox3; pleasant and cooperative, NAD, thin SKIN: no rash, lesions NODES: no lymphadenopathy HEENT: [place]/AT, EOM- WNL, Conjuctivae- clear, PERRLA, TM-WNL, Nose- clear, Throat- clear and wnl, Melampatti II, caps on teeth NECK: Supple w/ fair ROM, JVD- none, normal carotid impulses w/o bruits Thyroid- normal to palpation CHEST: Clear to P&A HEART: RRR, no m/g/r heard ABDOMEN: Soft and nl; nml bowel sounds; no organomegaly or masses noted UEA:VWUJ, nl pulses, no edema  NEURO: Grossly intact to observation      Impression & Recommendations:  Problem # 1:  INTRINSIC ASTHMA, UNSPECIFIED (ICD-493.10) Xolair availability may still be in negotiation. He is atopic and we discussed measures, including comparison of Xolair and traditional allergy vaccine hyposensitization. We discussed logistics, duration, reasonable expectations, goals and risks including anaphyllaxis. He will decide what he wants to do and call back if he decides he wants to start allergy vaccine. Pending that he will keep appt with Dr Shelle Iron.  Other Orders: Est. Patient Level II (81191) Allergy Puncture Test (47829) Allergy I.D Test (56213)  Patient Instructions: 1)  Please schedule a follow-up appointment as needed. 2)  Keep appointment with Dr Shelle Iron 3)  Call back if you need more information, or decide what you want to do about either allergy vaccine or Xolair.

## 2010-06-26 NOTE — Assessment & Plan Note (Signed)
Summary: rov for severe asthma   Copy to:  Jodi Geralds Primary Provider/Referring Provider:  [name]  CC:  Pt here discuss medications.  History of Present Illness: The pt comes in today for f/u of his severe asthma.  We have been trying to get him preauthorized to start xolair therapy because of his steroid dependency, but all attempts have failed despite letter to the medical director of his insurance company.  He is doing well currently, but is on prednisone 20mg  a day.  He also has his worst symptoms spring thru fall related to allergies.  He denies any significant cough or mucus.  He is still participating in his vigorous exercise.  Current Medications (verified): 1)  Allopurinol 100 Mg  Tabs (Allopurinol) .... Once Daily 2)  Protonix 40 Mg  Tbec (Pantoprazole Sodium) .... Take 1 Tablet By Mouth Two Times A Day 3)  Lipitor 10 Mg  Tabs (Atorvastatin Calcium) .... Take 1 Tablet By Mouth Once A Day 4)  Mucinex Dm Maximum Strength 60-1200 Mg  Tb12 (Dextromethorphan-Guaifenesin) .[name].. 1 Every 12 Hours 5)  Maxair Autohaler 200 Mcg/inh Aerb (Pirbuterol Acetate) .... Inhale 2 Puffs Every 6 Hours As Needed 6)  Qvar 80 Mcg/act Aers (Beclomethasone Dipropionate) .... Inhale 2 Puffs Two Times A Day 7)  Foradil Aerolizer 12 Mcg Caps (Formoterol Fumarate) .... Inhale Contents of 1 Cap Two Times A Day 8)  Prednisone 10 Mg  Tabs (Prednisone) .... As Directed - Currently Taking 20mg  Daily  Allergies (verified): 1)  ! Morphine 2)  ! Levaquin  Review of Systems      See HPI  Vital Signs:  Patient profile:   65 year old male Height:      72 inches Weight:      160.13 pounds O2 Sat:      99 % on Room air Temp:     97.9 degrees F oral Pulse rate:   49 / minute BP sitting:   108 / 70  (left arm) Cuff size:   regular  Vitals Entered By: Zackery Barefoot CMA (February 10, 2009 1:53 PM)  O2 Flow:  Room air CC: Pt here discuss medications Comments Medications reviewed with patient Zackery Barefoot CMA  February 10, 2009 1:54 PM    Physical Exam  General:  thin male in nad Nose:  no purulence noted. Lungs:  fairly clear to auscultation, with a few rhonchi Heart:  rrr, no mrg. Extremities:  no edema Neurologic:  alert and oriented, moves all 4.   Impression & Recommendations:  Problem # 1:  INTRINSIC ASTHMA, UNSPECIFIED (ICD-493.10) The pt is currently doing fairly well from an asthma standpoint, but is still on prednisone at 20mg /day.  I would like to try to taper this down.  I think he may benefit from allergy evaluation given his positive rast, and his insurance not allowing xolair.  I would hope this might allow [place] to taper chronic steroids further?  I also wonder whether he may have ABPA, but we should be able to get his prednisone lower even with this condition.  I have also discussed with him the risks of chronic steroid therapy, and asked him to discuss getting a baseline bone density study with his primary care physician.  Other Orders: Est. Patient Level III (04540) Allergy Referral  (Allergy) Pneumococcal Vaccine (98119) Admin 1st Vaccine (14782)  Patient Instructions: 1)  decrease prednisone down to 15mg  a day for 2 weeks, then to 10mg  a day and stay there. 2)  will get  you appt with allergy for evaluation. 3)  followup with me in 4mos or sooner if problems.  Prescriptions: FORADIL AEROLIZER 12 MCG CAPS (FORMOTEROL FUMARATE) inhale contents of 1 cap two times a day  #60 x 6   Entered and Authorized by:   [name] MD   Signed by:   [name] MD on 02/10/2009   Method used:   Print then Give to Patient   RxID:   [id number] FAOZ 80 MCG/ACT AERS (BECLOMETHASONE DIPROPIONATE) Inhale 2 puffs two times a day  #1 x 6   Entered and Authorized by:   [name] MD   Signed by:   [name] MD on 02/10/2009   Method used:   Print then Give to Patient   RxID:   [id number] PROTONIX 40 MG  TBEC (PANTOPRAZOLE SODIUM) Take 1 tablet by  mouth two times a day  #60 x 6   Entered and Authorized by:   [name] MD   Signed by:   [name] MD on 02/10/2009   Method used:   Print then Give to Patient   RxID:   [id number]    Immunization History:  Influenza Immunization History:    Influenza:  historical (02/09/2009)  Immunizations Administered:  Pneumonia Vaccine:    Vaccine Type: Pneumovax    Site: right deltoid    Mfr: Merck    Dose: 0.5 ml    Route: IM    Given by: Zackery Barefoot CMA    Exp. Date: 07/01/2009    Lot #: [id number]

## 2010-06-26 NOTE — Progress Notes (Signed)
Summary: trouble breathing  Phone Note Call from Patient Call back at Home Phone [phone]   Caller: Patient Reason for Call: Talk to Doctor Summary of Call: he seen dr wert Lucio Edward and he put him on prednisone. he is having more trouble breathing. he don't know what he needs to do. I told him if his breathing got worse to go to the er or call 911.  Initial call taken by: Valinda Hoar,  November 13, 2007 1:07 PM  Follow-up for Phone Call        Pt states his breathing is no better on themedications that Dr. Sherene Sires prescribed for him. This morning he started on a Prednisone taper, Augmentin and Tramadol. He is also taking Mucinex DM 2 two times a day. Pt doesn't think he's having an allergic reaction to any of the meds and did not sound like he was in any distress. Instructed the pt to continue on all meds prescribed. He is to go to the ED if sxs get worse over the weekend or call our office first of the week if no better. Follow-up by: Michel Bickers CMA,  November 13, 2007 1:50 PM         Appended Document: trouble breathing [name] called back and pt is coming to see Tammy Parrett today. Would like reassurance that this is not a reaction to the Prednisone or Augmentin.

## 2010-06-26 NOTE — Progress Notes (Signed)
Summary: sooner appt with [facility]  Phone Note Call from Patient Call back at Home Phone [phone]   Caller: Patient Call For: clance Summary of Call: Pt c/o sob, has appt on 9/15, wants to be seen today pls advise. Initial call taken by: Darletta Moll,  February 06, 2010 8:47 AM  Follow-up for Phone Call        [id number]. [facility] has no appts today but there is an available slot with MW. Carron Curie CMA  February 06, 2010 9:30 AM  Patient did not want to see MW today. 9/15 appt moved up to 9/14 @ 2pm with [facility]. Patient was fine with this.   Follow-up by: Michel Bickers CMA,  February 06, 2010 9:41 AM

## 2010-06-26 NOTE — Progress Notes (Signed)
Summary: need pre/post pft for xolair  Phone Note From Other Clinic Call back at [phone]   Caller: Aurther Loft with [facility] Call For: Dr. Shelle Iron Summary of Call: [phone] ext [phone] Aurther Loft with [facility] Geoffry [place]) She has recv'd all the ov notes, labs, skin test and pft's. She noticed that the pt has not had any post BD PFT's only pre. She is requesting that we fax over a pre & post PFT.  I will need an order in order to schedule pt. Thanks, Initial call taken by: Alfonso Ramus,  November 22, 2008 1:00 PM  Follow-up for Phone Call        we do not have pre and post pfts.  It really makes no difference in his case, and does not change my treatment decisions in any way.  Just tell them to get the xolair for the patient!!! Follow-up by: [name] MD,  November 22, 2008 1:49 PM  Additional Follow-up for Phone Call Additional follow up Details #1::        Returned Terry's call with [employer] and advised her that we didn't have pre/post pft's we only had pre.Also advised her of Dr. Teddy Spike response and she stated that was ok. That the pt's insurance makes them request this information and if it's not avail. it's not avail. she will submit application for xolair with what records we submitted. Rhonda Cobb  November 22, 2008 3:17 PM

## 2010-06-28 NOTE — Assessment & Plan Note (Signed)
Summary: rov for f/u of asthma and recent sinobronchitis   Visit Type:  Follow-up Copy to:  [facility] Primary Provider/Referring Provider:  [name]  CC:  1 week follow up. pt states he is no longer coughing up blood. pt c/o cough w/ light color phlem. Pt states his breathing "fine". pt states he has an increased swelling in his legs x 10 days. [name]  History of Present Illness: the pt comes in today for f/u of his known asthma and recent sinobronchitis with hemoptysis.  He was seen by our NP the last visit, and treated with abx.  He has significantly improved, and is returning to his normal baseline.  He denies any chest congestion, worsening sob, and his mucus is almost clear.  He does note some LE edema that has been intermittant since his THR surgery, but no calf pain or pleuritic chest pain.  Current Medications (verified): 1)  Allopurinol 100 Mg  Tabs (Allopurinol) .... Once Daily 2)  Protonix 40 Mg  Tbec (Pantoprazole Sodium) .... Take 1 Tablet By Mouth Two Times A Day 3)  Lipitor 10 Mg  Tabs (Atorvastatin Calcium) .... Take 1 Tablet By Mouth Once A Day 4)  Mucinex Dm Maximum Strength 60-1200 Mg  Tb12 (Dextromethorphan-Guaifenesin) .[name].. 1 Every 12 Hours 5)  Maxair Autohaler 200 Mcg/inh Aerb (Pirbuterol Acetate) .... Inhale 2 Puffs Every 6 Hours As Needed 6)  Qvar 80 Mcg/act Aers (Beclomethasone Dipropionate) .... Inhale 2 Puffs Two Times A Day 7)  Foradil Aerolizer 12 Mcg Caps (Formoterol Fumarate) .... Inhale Contents of 1 Cap Two Times A Day 8)  Allergy Vaccine Gh .... Adv To 1:10 Next Order 9)  Prednisone 5 Mg Tabs (Prednisone) .... Once Daily (Currently Taking 10 Mg X 2 Days) 10)  Hydromet 5-1.5 Mg/54ml Syrp (Hydrocodone-Homatropine) .[name].. 1 -2 Tsp Every 4-6 Hrs As Needed Cough , May Make You Sleepy  Allergies (verified): 1)  ! Levaquin  Review of Systems       The patient complains of productive cough, sore throat, nasal congestion/difficulty breathing through nose,  hand/feet swelling, joint stiffness or pain, rash, and change in color of mucus.  The patient denies shortness of breath with activity, shortness of breath at rest, non-productive cough, coughing up blood, chest pain, irregular heartbeats, acid heartburn, indigestion, loss of appetite, weight change, abdominal pain, difficulty swallowing, tooth/dental problems, headaches, sneezing, itching, ear ache, anxiety, depression, and fever.    Vital Signs:  Patient profile:   65 year old male Height:      74 inches Weight:      163.25 pounds BMI:     21.04 O2 Sat:      96 % on Room air Temp:     97.7 degrees F oral Pulse rate:   69 / minute BP sitting:   114 / 68  (left arm) Cuff size:   regular  Vitals Entered By: Carver Fila (April 30, 2010 9:48 AM)  O2 Flow:  Room air CC: 1 week follow up. pt states he is no longer coughing up blood. pt c/o cough w/ light color phlem. Pt states his breathing "fine". pt states he has an increased swelling in his legs x 10 days.  Comments meds and allergies updated Phone number updated Carver Fila  April 30, 2010 9:48 AM    Physical Exam  General:  thin male in nad Nose:  no purulence or drainaged noted. Lungs:  clear to auscultation, no wheezing or rhonchi Heart:  rrr, no mrg Extremities:  LE with very mild edema, varicosities noted. no cyanosis  Neurologic:  alert and oriented, moves all 4.   Impression & Recommendations:  Problem # 1:  INTRINSIC ASTHMA, UNSPECIFIED (ICD-493.10) the pt is doing well from an asthma standpoint currently, with no significant cough, congestion, or sob.  He is having some mild LE edema after his THR, but is intermittant.  He has no calf tenderness or evidence for dvt, but this needs to be watched carefully.  I have asked him to stay on his current meds, and to f/u with me in 3mos.  He is to call if his edema worsens or if he develops chest pain.  Medications Added to Medication List This Visit: 1)  Prednisone 5 Mg  Tabs (Prednisone) .... Once daily (currently taking 10 mg x 2 days)  Other Orders: Est. Patient Level III (09811)  Patient Instructions: 1)  no change in meds 2)  please call me in a few weeks if your leg edema continues to be a problem. 3)  followup with me in 3mos or sooner if having issues.   Prescriptions: PREDNISONE 5 MG TABS (PREDNISONE) once daily (currently taking 10 mg x 2 days)  #30 x 11   Entered and Authorized by:   [name] MD   Signed by:   [name] MD on 04/30/2010   Method used:   Print then Give to Patient   RxID:   [id number]    Immunization History:  Influenza Immunization History:    Influenza:  historical (02/24/2010)

## 2010-07-03 ENCOUNTER — Encounter: Payer: Self-pay | Admitting: Pulmonary Disease

## 2010-07-03 ENCOUNTER — Ambulatory Visit (INDEPENDENT_AMBULATORY_CARE_PROVIDER_SITE_OTHER): Payer: BC Managed Care – PPO | Admitting: Pulmonary Disease

## 2010-07-03 DIAGNOSIS — J45909 Unspecified asthma, uncomplicated: Secondary | ICD-10-CM

## 2010-07-03 DIAGNOSIS — R21 Rash and other nonspecific skin eruption: Secondary | ICD-10-CM

## 2010-07-03 DIAGNOSIS — J301 Allergic rhinitis due to pollen: Secondary | ICD-10-CM

## 2010-07-03 DIAGNOSIS — B4481 Allergic bronchopulmonary aspergillosis: Secondary | ICD-10-CM

## 2010-07-04 NOTE — Assessment & Plan Note (Signed)
Summary: 6 months/apc   Copy to:  [facility] Primary Provider/Referring Provider:  [name]  CC:  6 month follow up visit-allergies; no complaints..  History of Present Illness: 03/06/09- 62 yoM seen in Allergy consultation on kind referral by Dr Shelle Iron, with question of Xolair candidacy. He has minimal smoking hx and quit 40 years ago, but carries a dx of asthma/ copd. Has had some itching of eyes and says chest tightness and wheeze are worst in Spring and Fall.  RAST testing 01/29/08- IgE 364, with specific elevations for mite, grass, cockroach, trees. Rhinitis and itching eyes increased when he moved here 4 years ago. For the last 2 years symptoms have extended March til September and he has noted dyspnea, wheeze and chest tightness. Also reports nasal septal perforation with"mild sinus". PFTs reviewed.  December 25, 2009- Asthma, Hx ABPA 63 yoM foillowed b y Dr Shelle Iron for severe obstructive asthma. He was unable to qualify for Xolair and has been on allergy vaccine. He is "amazed" at how well he is doing. As of last week he is off prednisone for the first time in 2-3 years. Denies problems or reactions with the allergy shots. He hasn't used rescue inhaler more than 4-5 times in recent months- a marked improvement this year.  June 25, 2010- Asthma, Hx ABPA Nurse-CC: 6 month follow up visit-allergies; no complaints. Reviewed Dr Teddy Spike last ov referring to hx of sinobronchitis with hemoptysis. He denies recurrence of blood.  Getting second opinon before extensive spine surgery for degenerative disk disease.  He considers his breathing "fine now" after the one flare for which he sw Dr Shelle Iron. Using rescue inhaler only rarely.  Incidental rash right hand this winter, comes and goes w/o Rx, came after his hip surgery in November. He continues on prednisone 5 mg daily. I brought Dr Shelle Iron in to look at this since he will see Mr Walkup next week.      Asthma History    Asthma Control  Assessment:    Age range: 12+ years    Symptoms: 0-2 days/week    Nighttime Awakenings: 0-2/month    Interferes w/ normal activity: no limitations    SABA use (not for EIB): 0-2 days/week    Asthma Control Assessment: Well Controlled   Preventive Screening-Counseling & Management  Alcohol-Tobacco     Smoking Status: quit     Year Quit: 1985     Pack years: 74yrs 1 pk a day  Current Medications (verified): 1)  Allopurinol 100 Mg  Tabs (Allopurinol) .... Once Daily 2)  Protonix 40 Mg  Tbec (Pantoprazole Sodium) .... Take 1 Tablet By Mouth Two Times A Day 3)  Lipitor 10 Mg  Tabs (Atorvastatin Calcium) .... Take 1 Tablet By Mouth Once A Day 4)  Mucinex Dm Maximum Strength 60-1200 Mg  Tb12 (Dextromethorphan-Guaifenesin) .[name].. 1 Every 12 Hours 5)  Maxair Autohaler 200 Mcg/inh Aerb (Pirbuterol Acetate) .... Inhale 2 Puffs Every 6 Hours As Needed 6)  Qvar 80 Mcg/act Aers (Beclomethasone Dipropionate) .... Inhale 2 Puffs Two Times A Day 7)  Foradil Aerolizer 12 Mcg Caps (Formoterol Fumarate) .... Inhale Contents of 1 Cap Two Times A Day 8)  Allergy Vaccine Gh .... Adv To 1:10 Next Order 9)  Prednisone 5 Mg Tabs (Prednisone) .... Once Daily (Currently Taking 10 Mg X 2 Days)  Allergies (verified): 1)  ! Levaquin  Past History:  Past Medical History: severe asthma- allergy vaccine 2011 Degenerative disk disease Chronic steroid therapy  Past Surgical History: 3  knee surgeries 2 foot surgeries gallbladder -1995 Left Total hip replacement Sept, 2011- Dr Luiz Blare Cataract - bilateral  Review of Systems      See HPI  The patient denies anorexia, fever, weight loss, weight gain, vision loss, decreased hearing, hoarseness, chest pain, syncope, dyspnea on exertion, peripheral edema, prolonged cough, headaches, hemoptysis, abdominal pain, severe indigestion/heartburn, enlarged lymph nodes, and angioedema.         rash  Vital Signs:  Patient profile:   65 year old male Height:      74  inches Weight:      161 pounds BMI:     20.75 O2 Sat:      97 % on Room air Pulse rate:   84 / minute BP sitting:   124 / 70  (left arm) Cuff size:   regular  Vitals Entered By: Reynaldo Minium CMA (June 25, 2010 9:14 AM)  O2 Flow:  Room air CC: 6 month follow up visit-allergies; no complaints.   Physical Exam  Additional Exam:  General: A/Ox3; pleasant and cooperative, NAD, thin SKIN: nodular erythematous rash on dorsum of right hand, almost ring-like NODES: no lymphadenopathy HEENT: [place]/AT, EOM- WNL, Conjuctivae- clear, PERRLA, TM-WNL, Nose- clear, Throat- clear and wnl, Mallampati II, caps on teeth. voice raspy, no stridor NECK: Supple w/ fair ROM, JVD- none, normal carotid impulses w/o bruits Thyroid- normal to palpation CHEST: Clear to P&A, somewhat diminished, with no wheeze or cough HEART: RRR, no m/g/r heard ABDOMEN: Soft and nl;  ZOX:WRUE, nl pulses, no edema  NEURO: Grossly intact to observation      Impression & Recommendations:  Problem # 1:  ASTHMA (ICD-493.90) Better contol currently. He is now on allergy vaccine at planned long term maintenance dose and doing well. I will follow him for this management while Dr Shelle Iron remains his primary manager. We will watchfor problems as allergy season builds this Spring.  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 I showed it to Dr Shelle Iron for his attention next week. Meanwhile I will have him start topical antifungal.  KI treatment for sporotrichosis is a consideration, but this rash is not tracking up lymphatics yet. Recommend dermatologist if it fails to improve. I would be concerned if he went into spine surgery with this process not dealt with.   Other Orders: Est. Patient Level IV (45409)  Patient Instructions: 1)  Please schedule a follow-up appointment in 6 months for allergy follow-up 2)  Continue  allergy vaccine at 1:10 3)  Suggest you start otc antifungal cream like TOLNAFTATE or Clotrimazole , applied twice daily to rash. Show it to Dr Shelle Iron next week. If this doesn't get better you should see a dermatologist.

## 2010-07-10 ENCOUNTER — Ambulatory Visit (INDEPENDENT_AMBULATORY_CARE_PROVIDER_SITE_OTHER): Payer: BC Managed Care – PPO

## 2010-07-10 DIAGNOSIS — J301 Allergic rhinitis due to pollen: Secondary | ICD-10-CM

## 2010-07-12 ENCOUNTER — Ambulatory Visit (HOSPITAL_COMMUNITY)
Admission: RE | Admit: 2010-07-12 | Payer: BC Managed Care – PPO | Source: Ambulatory Visit | Admitting: Orthopedic Surgery

## 2010-07-12 NOTE — Assessment & Plan Note (Signed)
Summary: rov for asthma/ABPA   Copy to:  [facility] Primary Provider/Referring Provider:  [name]  CC:  Routine f/u appt.  States breathing is "pretty good."  Denies a cough.  Currently on 5mg  of Prednisone. Pt is scheduled for back surgery Feb 16 and needs surgical clearance.  requests rx for Protonix and Foradil.[name]  History of Present Illness: the pt comes in today for f/u of his known asthma and ABPA.  He is doing very well on his current meds, and is continuing with his allergy vaccine.  He has had no acute exacerbations, and is not using his rescue inhaler except on rare occasions.  He denies any significant cough or congestion.  He had a rash on his hand last visit with Dr. Maple Hudson, and asked to put a topical antifungal on this until his visit with me.  This is much improved by at least 80% compared to his last visit.  Current Medications (verified): 1)  Allopurinol 100 Mg  Tabs (Allopurinol) .... Once Daily 2)  Protonix 40 Mg  Tbec (Pantoprazole Sodium) .... Take 1 Tablet By Mouth Two Times A Day 3)  Lipitor 10 Mg  Tabs (Atorvastatin Calcium) .... Take 1 Tablet By Mouth Once A Day 4)  Mucinex Dm Maximum Strength 60-1200 Mg  Tb12 (Dextromethorphan-Guaifenesin) .[name].. 1 Every 12 Hours As Needed 5)  Maxair Autohaler 200 Mcg/inh Aerb (Pirbuterol Acetate) .... Inhale 2 Puffs Every 6 Hours As Needed 6)  Qvar 80 Mcg/act Aers (Beclomethasone Dipropionate) .... Inhale 2 Puffs Two Times A Day 7)  Foradil Aerolizer 12 Mcg Caps (Formoterol Fumarate) .... Inhale Contents of 1 Cap Two Times A Day 8)  Allergy Vaccine Gh .... Adv To 1:10 Next Order 9)  Prednisone 5 Mg Tabs (Prednisone) .... Take 1 Tablet By Mouth Once A Day  Allergies (verified): 1)  ! Levaquin  Review of Systems       The patient complains of shortness of breath with activity, shortness of breath at rest, weight change, sore throat, itching, joint stiffness or pain, and rash.  The patient denies productive cough, non-productive cough,  coughing up blood, chest pain, irregular heartbeats, acid heartburn, indigestion, loss of appetite, abdominal pain, difficulty swallowing, tooth/dental problems, headaches, nasal congestion/difficulty breathing through nose, sneezing, ear ache, anxiety, depression, hand/feet swelling, change in color of mucus, and fever.    Vital Signs:  Patient profile:   65 year old male Height:      74 inches Weight:      164.50 pounds O2 Sat:      97 % on Room air Temp:     97.5 degrees F oral Pulse rate:   85 / minute BP sitting:   120 / 68  (left arm) Cuff size:   regular  Vitals Entered By: Arman Filter LPN (July 03, 2010 9:26 AM)  O2 Flow:  Room air CC: Routine f/u appt.  States breathing is "pretty good."  Denies a cough.  Currently on 5mg  of Prednisone. Pt is scheduled for back surgery Feb 16 and needs surgical clearance.  requests rx for Protonix and Foradil. Comments Medications reviewed with patient Arman Filter LPN  July 03, 2010 9:27 AM    Physical Exam  General:  thin male in nad Nose:  no purulence or discharge noted. Lungs:  totally clear to aucultation no wheezing Heart:  rrr, no mrg Extremities:  no edema or cyanosis rash on right hand much improved. Neurologic:  alert and oriented, moves all 4.   Impression &  Recommendations:  Problem # 1:  ASTHMA (ICD-493.90) the pt is doing well from an asthma standpoint.  He feels that he is breathing fairly well, and is not having to use his rescue inhaler.  He is continuing his allergy vaccine, and feels this has really helped.  He is on 5mg  a day of prednisone (has ABPA, but we are able to often give him a drug holiday off steroids), but with his upcoming back surgery would like to leave him on this until after he has recovered.  I see no reason he won't do well from a pulmonary standpoint with is surgery.    Problem # 2:  SKIN RASH (ICD-782.1)  much improved with otc antifungal cream.  I have asked him to continue for  another week, but if returns will need to see derm.  Medications Added to Medication List This Visit: 1)  Mucinex Dm Maximum Strength 60-1200 Mg Tb12 (Dextromethorphan-guaifenesin) .[name].. 1 every 12 hours as needed 2)  Prednisone 5 Mg Tabs (Prednisone) .... Take 1 tablet by mouth once a day  Other Orders: Est. Patient Level III (04540)  Patient Instructions: 1)  continue putting antifungal cream on hand for another week 2)  no change in asthma meds. 3)  stay on prednisone at 5mg  a day until after your back surgery and weather gets warmer. 4)  followup with me in 4mos if doing well.   Prescriptions: FORADIL AEROLIZER 12 MCG CAPS (FORMOTEROL FUMARATE) inhale contents of 1 cap two times a day  #60 x 5   Entered and Authorized by:   [name] MD   Signed by:   [name] MD on 07/03/2010   Method used:   Electronically to        [facility]  [address] [phone]* (retail)       [address] [place], [place]  [address]       Ph: [phone] or [phone]       Fax: [phone]   RxID:   [id number] PROTONIX 40 MG  TBEC (PANTOPRAZOLE SODIUM) Take 1 tablet by mouth two times a day  #60 Tablet x 5   Entered and Authorized by:   [name] MD   Signed by:   [name] MD on 07/03/2010   Method used:   Electronically to        [facility]  [address] [phone]* (retail)       [address] [place], [place]  [address]       Ph: [phone] or [phone]       Fax: [phone]   RxID:   [id number]

## 2010-07-16 ENCOUNTER — Ambulatory Visit (INDEPENDENT_AMBULATORY_CARE_PROVIDER_SITE_OTHER): Payer: BC Managed Care – PPO

## 2010-07-16 ENCOUNTER — Encounter: Payer: Self-pay | Admitting: Pulmonary Disease

## 2010-07-16 DIAGNOSIS — J301 Allergic rhinitis due to pollen: Secondary | ICD-10-CM

## 2010-07-23 ENCOUNTER — Ambulatory Visit (INDEPENDENT_AMBULATORY_CARE_PROVIDER_SITE_OTHER): Payer: BC Managed Care – PPO

## 2010-07-23 ENCOUNTER — Encounter: Payer: Self-pay | Admitting: Internal Medicine

## 2010-07-23 DIAGNOSIS — J3089 Other allergic rhinitis: Secondary | ICD-10-CM | POA: Insufficient documentation

## 2010-07-23 DIAGNOSIS — J301 Allergic rhinitis due to pollen: Secondary | ICD-10-CM

## 2010-07-23 DIAGNOSIS — J302 Other seasonal allergic rhinitis: Secondary | ICD-10-CM | POA: Insufficient documentation

## 2010-07-24 NOTE — Letter (Signed)
Summary: Surgical clearance/[facility]  Surgical clearance/[facility]   Imported By: Lester  07/18/2010 10:32:05  _____________________________________________________________________  External Attachment:    Type:   Image     Comment:   External Document

## 2010-07-25 ENCOUNTER — Ambulatory Visit (HOSPITAL_COMMUNITY)
Admission: RE | Admit: 2010-07-25 | Discharge: 2010-07-25 | Disposition: A | Discharge status: Home / Self Care | Payer: BC Managed Care – PPO | Source: Ambulatory Visit | Attending: Neurological Surgery | Admitting: Neurological Surgery

## 2010-07-25 ENCOUNTER — Encounter (HOSPITAL_COMMUNITY)
Admission: RE | Admit: 2010-07-25 | Discharge: 2010-07-25 | Disposition: A | Discharge status: Home / Self Care | Payer: BC Managed Care – PPO | Source: Ambulatory Visit | Attending: Neurological Surgery | Admitting: Neurological Surgery

## 2010-07-25 ENCOUNTER — Other Ambulatory Visit (HOSPITAL_COMMUNITY): Payer: Self-pay | Admitting: Neurological Surgery

## 2010-07-25 DIAGNOSIS — M48061 Spinal stenosis, lumbar region without neurogenic claudication: Secondary | ICD-10-CM

## 2010-07-25 DIAGNOSIS — J449 Chronic obstructive pulmonary disease, unspecified: Secondary | ICD-10-CM | POA: Insufficient documentation

## 2010-07-25 DIAGNOSIS — M4310 Spondylolisthesis, site unspecified: Secondary | ICD-10-CM | POA: Insufficient documentation

## 2010-07-25 DIAGNOSIS — I10 Essential (primary) hypertension: Secondary | ICD-10-CM | POA: Insufficient documentation

## 2010-07-25 DIAGNOSIS — J4489 Other specified chronic obstructive pulmonary disease: Secondary | ICD-10-CM | POA: Insufficient documentation

## 2010-07-25 DIAGNOSIS — Z01818 Encounter for other preprocedural examination: Secondary | ICD-10-CM | POA: Insufficient documentation

## 2010-07-25 DIAGNOSIS — R05 Cough: Secondary | ICD-10-CM | POA: Insufficient documentation

## 2010-07-25 DIAGNOSIS — Z01812 Encounter for preprocedural laboratory examination: Secondary | ICD-10-CM | POA: Insufficient documentation

## 2010-07-25 DIAGNOSIS — Z01810 Encounter for preprocedural cardiovascular examination: Secondary | ICD-10-CM | POA: Insufficient documentation

## 2010-07-25 DIAGNOSIS — R059 Cough, unspecified: Secondary | ICD-10-CM | POA: Insufficient documentation

## 2010-07-25 LAB — BASIC METABOLIC PANEL
BUN: 15 mg/dL (ref 6–23)
CO2: 28 mEq/L (ref 19–32)
Calcium: 10.1 mg/dL (ref 8.4–10.5)
Chloride: 105 mEq/L (ref 96–112)
Creatinine, Ser: 1 mg/dL (ref 0.4–1.5)
GFR calc Af Amer: >60 mL/min (ref >60)
GFR calc non Af Amer: >60 mL/min (ref >60)
Glucose, Bld: 106 mg/dL — ABNORMAL HIGH (ref 70–99)
Potassium: 4.7 mEq/L (ref 3.5–5.1)
Sodium: 140 mEq/L (ref 135–145)

## 2010-07-25 LAB — CBC
HCT: 42.8 % (ref 39.0–52.0)
Hemoglobin: 14.8 g/dL (ref 13.0–17.0)
MCH: 31.3 pg (ref 26.0–34.0)
MCHC: 34.6 g/dL (ref 30.0–36.0)
MCV: 90.5 fL (ref 78.0–100.0)
Platelets: 211 10*3/uL (ref 150–400)
RBC: 4.73 MIL/uL (ref 4.22–5.81)
RDW: 15.2 % (ref 11.5–15.5)
WBC: 8 10*3/uL (ref 4.0–10.5)

## 2010-07-25 LAB — TYPE AND SCREEN
ABO/RH(D): O POS
Antibody Screen: NEGATIVE

## 2010-07-25 LAB — SURGICAL PCR SCREEN
MRSA, PCR: NEGATIVE
Staphylococcus aureus: POSITIVE — AB

## 2010-07-30 ENCOUNTER — Inpatient Hospital Stay (HOSPITAL_COMMUNITY)
Admission: RE | Admit: 2010-07-30 | Discharge: 2010-08-02 | DRG: 806 | Disposition: A | Discharge status: Home / Self Care | Payer: BC Managed Care – PPO | Source: Ambulatory Visit | Attending: Neurological Surgery | Admitting: Neurological Surgery

## 2010-07-30 ENCOUNTER — Inpatient Hospital Stay (HOSPITAL_COMMUNITY): Payer: BC Managed Care – PPO

## 2010-07-30 DIAGNOSIS — D62 Acute posthemorrhagic anemia: Secondary | ICD-10-CM | POA: Diagnosis not present

## 2010-07-30 DIAGNOSIS — Q762 Congenital spondylolisthesis: Secondary | ICD-10-CM

## 2010-07-30 DIAGNOSIS — M4120 Other idiopathic scoliosis, site unspecified: Principal | ICD-10-CM | POA: Diagnosis present

## 2010-07-30 DIAGNOSIS — Z79899 Other long term (current) drug therapy: Secondary | ICD-10-CM

## 2010-07-30 DIAGNOSIS — IMO0002 Reserved for concepts with insufficient information to code with codable children: Secondary | ICD-10-CM

## 2010-08-02 ENCOUNTER — Telehealth (INDEPENDENT_AMBULATORY_CARE_PROVIDER_SITE_OTHER): Payer: Self-pay | Admitting: *Deleted

## 2010-08-02 ENCOUNTER — Ambulatory Visit (INDEPENDENT_AMBULATORY_CARE_PROVIDER_SITE_OTHER): Payer: BC Managed Care – PPO

## 2010-08-02 ENCOUNTER — Encounter: Payer: Self-pay | Admitting: Internal Medicine

## 2010-08-02 DIAGNOSIS — J301 Allergic rhinitis due to pollen: Secondary | ICD-10-CM

## 2010-08-02 NOTE — Miscellaneous (Signed)
Summary: Orders Update   Clinical Lists Changes  Problems: Added new problem of ALLERGIC RHINITIS DUE TO POLLEN (ICD-477.0) Orders: Added new Service order of Allergy Injection (1) (95115) - Signed 

## 2010-08-07 NOTE — Assessment & Plan Note (Signed)
Summary: ALLERGY/CB  Nurse Visit   Allergies: 1)  ! Levaquin  Orders Added: 1)  Allergy Injection (1) [24401]

## 2010-08-07 NOTE — Progress Notes (Signed)
Summary: [facility] is being discharged from [facility] today & coming by for injection  Phone Note Call from Patient   Caller: spouse/alison Call For: allergy Summary of Call: Patients spouse alison phoned stated that he was being released from [facility] this morning and he said that he had spoken to the girl in the allergy dept and she told him that she would come down and give him his shot. alison is getting ready to go to [facility] to pick him up he is being released this morning. she can be reached [phone] her cell Initial call taken by: Vedia Coffer,  August 02, 2010 8:36 AM

## 2010-08-07 NOTE — Op Note (Signed)
NAME:  Levi [name], Levi [name] NO.:  [id number]  MEDICAL RECORD NO.:  [id number]           PATIENT TYPE:  O  LOCATION:  XRAY                         FACILITY:  [facility]  PHYSICIAN:  Stefani Dama, M.D.  DATE OF BIRTH:  Oct 11, 1945  DATE OF PROCEDURE:  07/30/2010 DATE OF DISCHARGE:  07/25/2010                              OPERATIVE REPORT   PREOPERATIVE DIAGNOSES:  Lumbar scoliosis, L1-L5; lumbar spondylolisthesis, L4-L5; lumbar stenosis, L4-L5 with bilateral radiculopathies in L4 and L5 distributions.  POSTOPERATIVE DIAGNOSES:  Lumbar scoliosis, L1-L5; lumbar spondylolisthesis, L4-L5; lumbar stenosis, L4-L5 with bilateral radiculopathies in L4 and L5 distributions.  PROCEDURE: 1. Anterolateral decompression and interbody arthrodesis L1-2, L2-3,     and L3-4 using excellent technique. 2. Posterolateral decompression and transforaminal fusion of L4-L5     using PEEK spacer allograft and autograft. 3. Segmental fixation L1-L5 with pedicle screws. 4. Posterolateral arthrodesis L4-L5 with allograft.  SURGEON:  Stefani Dama, MD  FIRST ASSISTANT:  Hewitt Shorts, MD  ANESTHESIA:  General endotracheal.  INDICATIONS:  Levi [name] is a 65 year old individual who has had significant problems with chronic back pain, bilateral lower extremity pain secondary to spondylolisthesis and degenerative scoliosis starting at L1-L5, spondylolisthesis being at L4-L5.  He has developed severe degenerative processes with nerve root compromise, a bit worse on the right than on the left.  Having failed efforts at conservative management, he was advised regarding surgical intervention and decompress and stabilize via a minimally invasive technique.  It was initially planned that we would do all 4 levels via the anterolateral technique.  However, at L4-L5 because of a high riding iliac crest, we were not able to access the L4-L5 level.  Therefore, posterior decompression and  stabilization via transforaminal route was chosen.  PROCEDURE:  The patient was brought to the operating room supine on the stretcher.  After smooth induction of general endotracheal anesthesia, he was turned to the left lateral decubitus position and secured to the operating table.  Fluoroscopic guidance was used to localize the interspace at L1-2, 2-3, 3-4, and 4-5.  It was apparent that even with a break in the table, we were not able to visualize L4-5 above the level of the crest.  With this it was decided to abandon the anterolateral procedure for L4-L5, however, once orthogonal images were obtained at the other levels, the patient was secured with multiple tapings and also various muscles of the lower extremities were instrumented with needles for EMG recording.  This included the hamstring, the quadriceps, the tibialis anterior, the gastrocs.  Fluoroscopic guidance was then used to localize the entry site on the right side.  For L3-L4, a vertical incision was created overlying this area after infiltrating the skin with some lidocaine.  The posterior incision was then created to enter the retroperitoneal space.  This was done with a Kelly forceps adjacent from my finger which entered into the retroperitoneal space.  Through this aperture, I could easily palpate the inferior margin of the rib, internal portion of the iliac crest, and the transverse process immediately adjacent.  The fascia was then opened with  blunt dissection of the probe through the incision into the retroperitoneal space and the probe was then placed over the interspace at L3-L4.  This was verified fluoroscopically and when the appropriate positioning was chosen, a K- wire was driven into the disk space.  Stimulation identified that we are to avoid any nearby neural structures.  The series of dilators were used to then place a lateral retractor which was fixed to the operating table.  Again radiographically  checking for orthogonal imaging.  Once the retractor was set and opened to the appropriate height, I was able to identify the fascia overlying the disk space and annulotomy was created and diskectomy was performed from the right side to the left side.  A series of dilators starting with a 16-mm paddle followed by an 18-mm x 8-mm trial was placed.  This allowed for clearing through the contralateral side and then using a Cobb dissector, the contralateral ligament was opened.  Once a total diskectomy was performed in this region, we were able to identify a good bony decortication and a 10-mm lordotic trial was placed.  It was felt that this would distract the disk space well and provided proper lordosis.  This was inserted into the L3-L4 space.  Once ancillary instrumentation including the retractor was removed, we turned our attention to L2-L3 where here also a 10-mm lordotic spacer was placed in a similar fashion.  At L1-L2, we were able to place an 8-mm x 22-mm lordotic spacer.  Each of the other spaces in L2-3 and L3-4 also 22 mm in anterior-posterior dimension.  Once all the spacers were placed, we irrigated the wound and closed it with 2-0 Vicryl in the fascia and 3-0 Vicryl in subcuticular tissues.  Blood loss for this portion of the procedure was estimated at about 200 mL.  The patient was then turned prone and the back was then prepped with alcohol and DuraPrep and draped in a sterile fashion.  Fluoroscopic guidance was then used to place percutaneously pedicle screws in L1, L2, L3, L4, and L5.  This was done by repeating process of the first using a Jamshidi needle to tap down the pedicle on the pedicle view of fluoroscopy.  Once the vertebral body was entered, the K-wire was placed into the vertebral body.  We did 1 vertebral level at a time doing left and right simultaneously.  Then over the K-wire, a small incision was created and a series of dilators were passed.  A 6.5 mm tap  was used to tap threads into the pedicle and into the vertebral body and then at L5, 6.5 x 50 mm screws were placed at L4 and all the L3 and L2 6.5 x 45 mm screws were used.  At L1 6.5 x 50 mm screws were used.  At each level, we did check the positioning of screws, noted good medialization and no cutout was appreciated through the pedicular walls.  Once all the screws were placed, then on the left side at the L4-L5 level a transforaminal approach was chosen.  Screws at L4 and L5 were removed to allow placement of a minimally invasive retractor and then the intertransverse muscle on the left side was cleared and the facet and foraminal zone was opened so as to allow visualization of the disk space.  The L4 nerve root was identified above this.  The fascia was taken out of this area and I then identified the disk space more fully and used a 15 blade to create  an annulotomy.  Complete diskectomy was performed through this aperture with a series of dilators being used to choose appropriate spacer of a TLIF variety.  Ultimately it was felt that 11-mm spacer could be placed at the L4-L5 space and this allowed for good reduction of spondylolisthesis.  The worst of the osteophytosis was noted to occur on the right side and approach was through the left side.  Once the diskectomy was completed and the trials were set up to a 10 mm size, we used an 11 mm size implant and tapped this towards and across the midline towards the right side.  Additional allograft was placed into the interspace.  This was Vitoss with Puragen soaked upon it.  Once the interspace was packed completely, there was noted be a small hint of spinal fluid that was leaking from the lateral edge of fascia where it was dissected towards the common dural tube.  In this area, we then carefully tamped out this with a small bit of Gelfoam and placed some DuraSeal over this.  The intertransverse space was then packed with additional  allograft between L4-L5.  Screws were placed into the pedicles of L4 and L5 and then using the towers to check the height and precontoured rod to fit between these set tube parallel rods measuring 150 mm with a fixed contour into the rods between the screw heads. These were then tightened sequentially and a reduction of the scoliosis was obtained.  This was verified in the AP and lateral projections on the fluoro.  Once the screw heads were torqued, the towers were individually removed.  The wounds were each irrigated copiously with antibiotic irrigating solution and then the fascia was closed with 2-0 Vicryl inverted interrupted fashion, 3-0 Vicryl using subcuticular tissues.  At this portion of the procedure, blood loss was estimated at about 400 mL.  The patient tolerated the surgery well, returned to recovery room in stable condition.     Stefani Dama, M.D.     Merla Riches  D:  07/30/2010  T:  07/31/2010  Job:  [id number]  Electronically Signed by Barnett Abu M.D. on 08/07/2010 09:30:29 AM

## 2010-08-09 LAB — BASIC METABOLIC PANEL
BUN: 7 mg/dL (ref 6–23)
BUN: 8 mg/dL (ref 6–23)
CO2: 31 mEq/L (ref 19–32)
CO2: 33 mEq/L — ABNORMAL HIGH (ref 19–32)
Calcium: 8.6 mg/dL (ref 8.4–10.5)
Calcium: 8.6 mg/dL (ref 8.4–10.5)
Chloride: 101 mEq/L (ref 96–112)
Chloride: 104 mEq/L (ref 96–112)
Creatinine, Ser: 0.66 mg/dL (ref 0.4–1.5)
Creatinine, Ser: 0.69 mg/dL (ref 0.4–1.5)
GFR calc Af Amer: >60 mL/min (ref >60)
GFR calc Af Amer: >60 mL/min (ref >60)
GFR calc non Af Amer: >60 mL/min (ref >60)
GFR calc non Af Amer: >60 mL/min (ref >60)
Glucose, Bld: 115 mg/dL — ABNORMAL HIGH (ref 70–99)
Glucose, Bld: 121 mg/dL — ABNORMAL HIGH (ref 70–99)
Potassium: 3.6 mEq/L (ref 3.5–5.1)
Potassium: 3.9 mEq/L (ref 3.5–5.1)
Sodium: 138 mEq/L (ref 135–145)
Sodium: 139 mEq/L (ref 135–145)

## 2010-08-09 LAB — CBC
HCT: 31.8 % — ABNORMAL LOW (ref 39.0–52.0)
HCT: 33.8 % — ABNORMAL LOW (ref 39.0–52.0)
HCT: 42.8 % (ref 39.0–52.0)
Hemoglobin: 10.9 g/dL — ABNORMAL LOW (ref 13.0–17.0)
Hemoglobin: 11.4 g/dL — ABNORMAL LOW (ref 13.0–17.0)
Hemoglobin: 14.7 g/dL (ref 13.0–17.0)
MCH: 32.2 pg (ref 26.0–34.0)
MCH: 33 pg (ref 26.0–34.0)
MCH: 33.5 pg (ref 26.0–34.0)
MCHC: 33.7 g/dL (ref 30.0–36.0)
MCHC: 34.3 g/dL (ref 30.0–36.0)
MCHC: 34.3 g/dL (ref 30.0–36.0)
MCV: 95.5 fL (ref 78.0–100.0)
MCV: 96.4 fL (ref 78.0–100.0)
MCV: 97.5 fL (ref 78.0–100.0)
Platelets: 157 10*3/uL (ref 150–400)
Platelets: 161 10*3/uL (ref 150–400)
Platelets: 191 10*3/uL (ref 150–400)
RBC: 3.3 MIL/uL — ABNORMAL LOW (ref 4.22–5.81)
RBC: 3.54 MIL/uL — ABNORMAL LOW (ref 4.22–5.81)
RBC: 4.39 MIL/uL (ref 4.22–5.81)
RDW: 12.8 % (ref 11.5–15.5)
RDW: 13 % (ref 11.5–15.5)
RDW: 13.3 % (ref 11.5–15.5)
WBC: 12.2 10*3/uL — ABNORMAL HIGH (ref 4.0–10.5)
WBC: 13.8 10*3/uL — ABNORMAL HIGH (ref 4.0–10.5)
WBC: 8.7 10*3/uL (ref 4.0–10.5)

## 2010-08-09 LAB — COMPREHENSIVE METABOLIC PANEL
ALT: 21 U/L (ref 0–53)
AST: 24 U/L (ref 0–37)
Albumin: 3.9 g/dL (ref 3.5–5.2)
Alkaline Phosphatase: 55 U/L (ref 39–117)
BUN: 15 mg/dL (ref 6–23)
CO2: 29 mEq/L (ref 19–32)
Calcium: 9.8 mg/dL (ref 8.4–10.5)
Chloride: 107 mEq/L (ref 96–112)
Creatinine, Ser: 0.94 mg/dL (ref 0.4–1.5)
GFR calc Af Amer: >60 mL/min (ref >60)
GFR calc non Af Amer: >60 mL/min (ref >60)
Glucose, Bld: 102 mg/dL — ABNORMAL HIGH (ref 70–99)
Potassium: 4.1 mEq/L (ref 3.5–5.1)
Sodium: 143 mEq/L (ref 135–145)
Total Bilirubin: 0.8 mg/dL (ref 0.3–1.2)
Total Protein: 6 g/dL (ref 6.0–8.3)

## 2010-08-09 LAB — ABO/RH: ABO/RH(D): O POS

## 2010-08-09 LAB — DIFFERENTIAL
Basophils Absolute: 0 10*3/uL (ref 0.0–0.1)
Basophils Relative: 1 % (ref 0–1)
Eosinophils Absolute: 0.2 10*3/uL (ref 0.0–0.7)
Eosinophils Relative: 2 % (ref 0–5)
Lymphocytes Relative: 16 % (ref 12–46)
Lymphs Abs: 1.4 10*3/uL (ref 0.7–4.0)
Monocytes Absolute: 0.9 10*3/uL (ref 0.1–1.0)
Monocytes Relative: 11 % (ref 3–12)
Neutro Abs: 6.1 10*3/uL (ref 1.7–7.7)
Neutrophils Relative %: 71 % (ref 43–77)

## 2010-08-09 LAB — URINALYSIS, ROUTINE W REFLEX MICROSCOPIC
Glucose, UA: NEGATIVE mg/dL
Hgb urine dipstick: NEGATIVE
Ketones, ur: NEGATIVE mg/dL
Nitrite: NEGATIVE
Protein, ur: NEGATIVE mg/dL
Specific Gravity, Urine: 1.025 (ref 1.005–1.030)
Urobilinogen, UA: 0.2 mg/dL (ref 0.0–1.0)
pH: 6 (ref 5.0–8.0)

## 2010-08-09 LAB — TYPE AND SCREEN
ABO/RH(D): O POS
Antibody Screen: NEGATIVE

## 2010-08-09 LAB — POCT I-STAT 4, (NA,K, GLUC, HGB,HCT)
Glucose, Bld: 134 mg/dL — ABNORMAL HIGH (ref 70–99)
HCT: 32 % — ABNORMAL LOW (ref 39.0–52.0)
Hemoglobin: 10.9 g/dL — ABNORMAL LOW (ref 13.0–17.0)
Potassium: 3.9 mEq/L (ref 3.5–5.1)
Sodium: 136 mEq/L (ref 135–145)

## 2010-08-09 LAB — SURGICAL PCR SCREEN
MRSA, PCR: NEGATIVE
Staphylococcus aureus: NEGATIVE

## 2010-08-09 LAB — APTT: aPTT: 24 seconds (ref 24–37)

## 2010-08-09 LAB — PROTIME-INR
INR: 0.91 (ref 0.00–1.49)
INR: 1.03 (ref 0.00–1.49)
INR: 1.49 (ref 0.00–1.49)
Prothrombin Time: 12.5 seconds (ref 11.6–15.2)
Prothrombin Time: 13.7 seconds (ref 11.6–15.2)
Prothrombin Time: 18.2 seconds — ABNORMAL HIGH (ref 11.6–15.2)

## 2010-08-12 LAB — POCT CARDIAC MARKERS
CKMB, poc: <1 ng/mL — ABNORMAL LOW (ref 1.0–8.0)
CKMB, poc: <1 ng/mL — ABNORMAL LOW (ref 1.0–8.0)
Myoglobin, poc: 47.4 ng/mL (ref 12–200)
Myoglobin, poc: 52.4 ng/mL (ref 12–200)
Troponin i, poc: <0.05 ng/mL (ref 0.00–0.09)
Troponin i, poc: <0.05 ng/mL (ref 0.00–0.09)

## 2010-08-12 LAB — DIFFERENTIAL
Basophils Absolute: 0.1 10*3/uL (ref 0.0–0.1)
Basophils Relative: 1 % (ref 0–1)
Eosinophils Absolute: 0.3 10*3/uL (ref 0.0–0.7)
Eosinophils Relative: 4 % (ref 0–5)
Lymphocytes Relative: 31 % (ref 12–46)
Lymphs Abs: 2.5 10*3/uL (ref 0.7–4.0)
Monocytes Absolute: 0.7 10*3/uL (ref 0.1–1.0)
Monocytes Relative: 8 % (ref 3–12)
Neutro Abs: 4.6 10*3/uL (ref 1.7–7.7)
Neutrophils Relative %: 56 % (ref 43–77)

## 2010-08-12 LAB — BASIC METABOLIC PANEL
BUN: 21 mg/dL (ref 6–23)
CO2: 30 mEq/L (ref 19–32)
Calcium: 9.2 mg/dL (ref 8.4–10.5)
Chloride: 105 mEq/L (ref 96–112)
Creatinine, Ser: 0.8 mg/dL (ref 0.4–1.5)
GFR calc Af Amer: >60 mL/min (ref >60)
GFR calc non Af Amer: >60 mL/min (ref >60)
Glucose, Bld: 83 mg/dL (ref 70–99)
Potassium: 4.7 mEq/L (ref 3.5–5.1)
Sodium: 142 mEq/L (ref 135–145)

## 2010-08-12 LAB — CBC
HCT: 41.1 % (ref 39.0–52.0)
Hemoglobin: 14.1 g/dL (ref 13.0–17.0)
MCHC: 34.4 g/dL (ref 30.0–36.0)
MCV: 99.7 fL (ref 78.0–100.0)
Platelets: 178 10*3/uL (ref 150–400)
RBC: 4.13 MIL/uL — ABNORMAL LOW (ref 4.22–5.81)
RDW: 12.8 % (ref 11.5–15.5)
WBC: 8.2 10*3/uL (ref 4.0–10.5)

## 2010-08-15 ENCOUNTER — Ambulatory Visit (INDEPENDENT_AMBULATORY_CARE_PROVIDER_SITE_OTHER): Payer: BC Managed Care – PPO

## 2010-08-15 DIAGNOSIS — J301 Allergic rhinitis due to pollen: Secondary | ICD-10-CM

## 2010-08-15 LAB — CBC
HCT: 45.1 % (ref 39.0–52.0)
Hemoglobin: 15.2 g/dL (ref 13.0–17.0)
MCHC: 33.7 g/dL (ref 30.0–36.0)
MCV: 102.3 fL — ABNORMAL HIGH (ref 78.0–100.0)
Platelets: 199 10*3/uL (ref 150–400)
RBC: 4.41 MIL/uL (ref 4.22–5.81)
RDW: 13.6 % (ref 11.5–15.5)
WBC: 10.2 10*3/uL (ref 4.0–10.5)

## 2010-08-15 LAB — APTT: aPTT: 25 seconds (ref 24–37)

## 2010-08-15 LAB — COMPREHENSIVE METABOLIC PANEL
ALT: 27 U/L (ref 0–53)
AST: 30 U/L (ref 0–37)
Albumin: 4.5 g/dL (ref 3.5–5.2)
Alkaline Phosphatase: 58 U/L (ref 39–117)
BUN: 22 mg/dL (ref 6–23)
CO2: 28 mEq/L (ref 19–32)
Calcium: 10 mg/dL (ref 8.4–10.5)
Chloride: 106 mEq/L (ref 96–112)
Creatinine, Ser: 0.96 mg/dL (ref 0.4–1.5)
GFR calc Af Amer: >60 mL/min (ref >60)
GFR calc non Af Amer: >60 mL/min (ref >60)
Glucose, Bld: 119 mg/dL — ABNORMAL HIGH (ref 70–99)
Potassium: 4.8 mEq/L (ref 3.5–5.1)
Sodium: 140 mEq/L (ref 135–145)
Total Bilirubin: 1.1 mg/dL (ref 0.3–1.2)
Total Protein: 6.8 g/dL (ref 6.0–8.3)

## 2010-08-15 LAB — URINALYSIS, ROUTINE W REFLEX MICROSCOPIC
Glucose, UA: NEGATIVE mg/dL
Hgb urine dipstick: NEGATIVE
Ketones, ur: 15 mg/dL — AB
Nitrite: NEGATIVE
Protein, ur: NEGATIVE mg/dL
Specific Gravity, Urine: >1.046 — ABNORMAL HIGH (ref 1.005–1.030)
Urobilinogen, UA: 0.2 mg/dL (ref 0.0–1.0)
pH: 5.5 (ref 5.0–8.0)

## 2010-08-15 LAB — DIFFERENTIAL
Basophils Absolute: 0 10*3/uL (ref 0.0–0.1)
Basophils Relative: 0 % (ref 0–1)
Eosinophils Absolute: 0.1 10*3/uL (ref 0.0–0.7)
Eosinophils Relative: 1 % (ref 0–5)
Lymphocytes Relative: 9 % — ABNORMAL LOW (ref 12–46)
Lymphs Abs: 0.9 10*3/uL (ref 0.7–4.0)
Monocytes Absolute: 0.4 10*3/uL (ref 0.1–1.0)
Monocytes Relative: 4 % (ref 3–12)
Neutro Abs: 8.8 10*3/uL — ABNORMAL HIGH (ref 1.7–7.7)
Neutrophils Relative %: 86 % — ABNORMAL HIGH (ref 43–77)

## 2010-08-15 LAB — PROTIME-INR
INR: 1 (ref 0.00–1.49)
Prothrombin Time: 13.1 seconds (ref 11.6–15.2)

## 2010-08-16 ENCOUNTER — Ambulatory Visit (INDEPENDENT_AMBULATORY_CARE_PROVIDER_SITE_OTHER): Payer: BC Managed Care – PPO

## 2010-08-16 DIAGNOSIS — J301 Allergic rhinitis due to pollen: Secondary | ICD-10-CM

## 2010-08-22 ENCOUNTER — Ambulatory Visit (INDEPENDENT_AMBULATORY_CARE_PROVIDER_SITE_OTHER): Payer: BC Managed Care – PPO

## 2010-08-22 DIAGNOSIS — J301 Allergic rhinitis due to pollen: Secondary | ICD-10-CM

## 2010-08-28 ENCOUNTER — Ambulatory Visit (INDEPENDENT_AMBULATORY_CARE_PROVIDER_SITE_OTHER): Payer: BC Managed Care – PPO

## 2010-08-28 DIAGNOSIS — J301 Allergic rhinitis due to pollen: Secondary | ICD-10-CM

## 2010-08-30 LAB — CBC
HCT: 42.9 % (ref 39.0–52.0)
Hemoglobin: 14.8 g/dL (ref 13.0–17.0)
MCHC: 34.4 g/dL (ref 30.0–36.0)
MCV: 101.7 fL — ABNORMAL HIGH (ref 78.0–100.0)
Platelets: 173 10*3/uL (ref 150–400)
RBC: 4.22 MIL/uL (ref 4.22–5.81)
RDW: 14.1 % (ref 11.5–15.5)
WBC: 11.1 10*3/uL — ABNORMAL HIGH (ref 4.0–10.5)

## 2010-08-30 LAB — COMPREHENSIVE METABOLIC PANEL
ALT: 28 U/L (ref 0–53)
AST: 34 U/L (ref 0–37)
Albumin: 4.4 g/dL (ref 3.5–5.2)
Alkaline Phosphatase: 68 U/L (ref 39–117)
BUN: 26 mg/dL — ABNORMAL HIGH (ref 6–23)
CO2: 25 mEq/L (ref 19–32)
Calcium: 10.3 mg/dL (ref 8.4–10.5)
Chloride: 102 mEq/L (ref 96–112)
Creatinine, Ser: 1.02 mg/dL (ref 0.4–1.5)
GFR calc Af Amer: >60 mL/min (ref >60)
GFR calc non Af Amer: >60 mL/min (ref >60)
Glucose, Bld: 103 mg/dL — ABNORMAL HIGH (ref 70–99)
Potassium: 4 mEq/L (ref 3.5–5.1)
Sodium: 137 mEq/L (ref 135–145)
Total Bilirubin: 1.1 mg/dL (ref 0.3–1.2)
Total Protein: 7 g/dL (ref 6.0–8.3)

## 2010-08-30 LAB — POCT CARDIAC MARKERS
CKMB, poc: 1.4 ng/mL (ref 1.0–8.0)
CKMB, poc: 1.4 ng/mL (ref 1.0–8.0)
CKMB, poc: 2.4 ng/mL (ref 1.0–8.0)
Myoglobin, poc: 65.9 ng/mL (ref 12–200)
Myoglobin, poc: 89.1 ng/mL (ref 12–200)
Myoglobin, poc: 90.1 ng/mL (ref 12–200)
Troponin i, poc: <0.05 ng/mL (ref 0.00–0.09)
Troponin i, poc: <0.05 ng/mL (ref 0.00–0.09)
Troponin i, poc: <0.05 ng/mL (ref 0.00–0.09)

## 2010-08-30 LAB — DIFFERENTIAL
Basophils Absolute: 0 10*3/uL (ref 0.0–0.1)
Basophils Relative: 0 % (ref 0–1)
Eosinophils Absolute: 0 10*3/uL (ref 0.0–0.7)
Eosinophils Relative: 0 % (ref 0–5)
Lymphocytes Relative: 11 % — ABNORMAL LOW (ref 12–46)
Lymphs Abs: 1.2 10*3/uL (ref 0.7–4.0)
Monocytes Absolute: 0.5 10*3/uL (ref 0.1–1.0)
Monocytes Relative: 4 % (ref 3–12)
Neutro Abs: 9.4 10*3/uL — ABNORMAL HIGH (ref 1.7–7.7)
Neutrophils Relative %: 85 % — ABNORMAL HIGH (ref 43–77)

## 2010-09-03 LAB — POCT CARDIAC MARKERS
CKMB, poc: <1 ng/mL — ABNORMAL LOW (ref 1.0–8.0)
Myoglobin, poc: 41.4 ng/mL (ref 12–200)
Troponin i, poc: <0.05 ng/mL (ref 0.00–0.09)

## 2010-09-05 ENCOUNTER — Ambulatory Visit (INDEPENDENT_AMBULATORY_CARE_PROVIDER_SITE_OTHER): Payer: BC Managed Care – PPO

## 2010-09-05 DIAGNOSIS — J309 Allergic rhinitis, unspecified: Secondary | ICD-10-CM

## 2010-09-10 NOTE — Discharge Summary (Signed)
NAME:  Levi [name] NO.:  [id number]  MEDICAL RECORD NO.:  [id number]           PATIENT TYPE:  I  LOCATION:  3035                         FACILITY:  [facility]  PHYSICIAN:  Levi [name], M.D.  DATE OF BIRTH:  04-15-1946  DATE OF ADMISSION:  07/30/2010 DATE OF DISCHARGE:  08/02/2010                              DISCHARGE SUMMARY   ADMITTING DIAGNOSES:  L1-L5 degenerative scoliosis, spondylolisthesis, L4-5 degenerative type with low back pain and radiculopathy.  DISCHARGE DIAGNOSES:  L1-L5 degenerative scoliosis, spondylolisthesis, L4-5 degenerative type with low back pain and radiculopathy with acute blood loss anemia.  OPERATIONS AND PROCEDURES:  Anterolateral decompression L1-2, L2-3, L3- 4, with an excellent procedure, and TLIF procedure at L4-5 with segmental fixation, pedicle screw fixation, L1-L5, and posterior lateral fusion at L4-5.  BRIEF HISTORY AND HOSPITAL COURSE:  The patient is a 65 year old gentleman who has had significant problems with chronic low back pain, bilateral lower extremity pain secondary to degenerative scoliosis and spondylolisthesis from L1-L5.  He has developed severe degenerative nerve root compromise and failed conservative management, and has been advised for surgical intervention, and the decompression stabilized.  He elects to proceed with surgery.  The patient tolerated decompression and fusion L1-L5 with XLIF procedure, TLIF procedure, and pedicle screw fixation, minimally invasive along with posterolateral fusion at L4-5 on July 30, 2010.  He was postoperatively stabilized and placed in surgical ICU, placed on Dilaudid PCA pump.  First day postoperatively, the patient was doing well, tolerating clear liquids, advanced his diet. Foley catheter was discontinued.  He was weaned off his PCA to p.o. Percocet, Hep-Lock IV, transferred to 3000, started working with physical therapy and occupational therapy.  Vital signs are  stable.  He is afebrile throughout hospitalization.  Neurovascularly intact.  Motor sensation within normal limits.  Using [employer] brace.  Second day postoperatively, he continued to make good progress.  He was placed on a bowel regimen and no new focal deficits, again made good progress with therapy, was able to recall his back precautions and was ready for discharge home on August 02, 2010.  Eating well, voiding well, ambulating safely, and comfortable on pain medicine orally.  DISCHARGE CONDITION:  Stable, improved.  DISCHARGE INSTRUCTIONS:  Discharge home.  Ambulate with a rolling walker with 5-inch wheels.  Home durable medical has been arranged.  Given prescription for Percocet for pain 5/325 one to two p.o. q.4-6 h. p.r.n. pain, Valium 5 mg one p.o. q.6-8 h. p.r.n. muscle spasm.  MEDICATIONS:  Continue on his home medications, which include; 1. Candesartan 2 mg p.o. daily. 2. Lipitor 10 mg p.o. daily. 3. Allopurinol 100 mg p.o. daily. 4. Foradil 12 mcg one capsule inhaled b.i.d. 5. QVAR 80 mcg 2 puffs inhaled b.i.d. 6. Mucinex over-the-counter, maximum strength one p.o. daily. 7. Protonix 40 mg one p.o. b.i.d. 8. Prednisone 10 mg one p.o. daily. 9. Allergy shots every once a week subcu. 10.Maxair 200 mcg two puffs inhaled q.4 h. 11.Isosorbide mononitrate XR 30 mg one p.o. daily. 12.Fish oil over-the-counter one p.o. daily. 13.Omega-3 one p.o. daily. 14.Percocet 5/325 one to two p.o.  q.4-6 h. p.r.n. pain. 15.Valium 5 mg one p.o. q.6-8 h. p.r.n. muscle spasm. 16.Dulcolax tablets p.r.n. 17.Milk of magnesia 30 mL p.o. daily for bowel regimen.  Contact our office for follow-up appointment at [phone], to be seen in 2 to 3 weeks for clinical check and radiographs.  Contact our office prior to follow up if there are any questions or concerns.  [employer] brace for when he is up and about.  Continue his back precautions.  Regular diet.  He may shower.  All questions  were encouraged, answered, and addressed.  The patient was discharged home on August 02, 2010 in stable condition and improved.     Aura Fey [facility].   ______________________________ Levi [name], M.D.    SCI/MEDQ  D:  08/02/2010  T:  08/02/2010  Job:  [id number]  Electronically Signed by Orlin Hilding P.A. on 08/09/2010 09:36:46 AM Electronically Signed by Barnett Abu M.D. on 09/10/2010 10:09:35 AM

## 2010-09-12 ENCOUNTER — Ambulatory Visit (INDEPENDENT_AMBULATORY_CARE_PROVIDER_SITE_OTHER): Payer: BC Managed Care – PPO

## 2010-09-12 DIAGNOSIS — J309 Allergic rhinitis, unspecified: Secondary | ICD-10-CM

## 2010-09-19 ENCOUNTER — Ambulatory Visit (INDEPENDENT_AMBULATORY_CARE_PROVIDER_SITE_OTHER): Payer: BC Managed Care – PPO

## 2010-09-19 DIAGNOSIS — J309 Allergic rhinitis, unspecified: Secondary | ICD-10-CM

## 2010-09-26 ENCOUNTER — Ambulatory Visit (INDEPENDENT_AMBULATORY_CARE_PROVIDER_SITE_OTHER): Payer: BC Managed Care – PPO

## 2010-09-26 DIAGNOSIS — J309 Allergic rhinitis, unspecified: Secondary | ICD-10-CM

## 2010-10-02 ENCOUNTER — Ambulatory Visit (INDEPENDENT_AMBULATORY_CARE_PROVIDER_SITE_OTHER): Payer: BC Managed Care – PPO

## 2010-10-02 DIAGNOSIS — J309 Allergic rhinitis, unspecified: Secondary | ICD-10-CM

## 2010-10-09 NOTE — Assessment & Plan Note (Signed)
[facility]                                 ON-CALL NOTE   [name], Levi [name]                          MRN:          [id number]  DATE:02/05/2010                            DOB:          30-Jan-1946    The patient is seen by Dr. Marcelyn Bruins.  The patient called and said  over the past 2 weeks since discontinuing prednisone prior to that, he  has developed a gradual shortness of breath not associated with any  cough or fever.  He added that recent allergy shots had also begun  approximately 2 weeks ago.  He is scheduled to see Dr. Shelle Iron this  Thursday.  He was asking should he have a trial of prednisone for the  gradual shortness of breath prior to that visit.  I encouraged the  patient to call the office tomorrow unless his breathing drastically  worsened anticipating that Dr. Shelle Iron would prefer prescribing the  steroids if really indicated prior to his seeing the patient.  During  the interview, the patient seemed to be talking in full sentences with  no marked difficulty in breathing.  I told him if he had any further  difficulties, he can call back to the on-call number tonight, otherwise  if his breathing got markedly worse he should probably go to the  emergency room or an Urgent Care Center.     Rogelia Rohrer, MD  Electronically Signed    Clemetine Marker  DD: 02/05/2010  DT: 02/06/2010  Job #: [id number]

## 2010-10-09 NOTE — Letter (Signed)
January 09, 2009    [facility].  Attn: Medical Director   Patient ID#: [id number]   RE:  [name], [name]  MRN:  [id number]  /  DOB:  1946/03/17   To Whom It May Concern:   I am writing on behalf of Mr. Levi [name], a patient that I see with  severe and refractory asthma.  The patient has been maintained on an  aggressive regimen with a long-acting bronchodilator and steroid  combination as well as a leukotriene modifier.  He has also had  aggressive treatment of gastroesophageal reflux disease as well as his  chronic allergic rhinitis.  Despite all this, he has continued to have  frequent exacerbations requiring high-dose prednisone in order to keep  him controlled.  The patient has significant airflow obstruction on his  pulmonary function studies.  He has been found to have a very abnormal  RAST test, with multiple allergens being present.  He also has an  elevated IgE level.  At this point in time, I feel very strongly that  Mr. Levi [name] requires a trial of Xolair in order to keep his asthma  controlled, and also to get him off of high-dose steroids if possible.  I understand this request has been denied, and I am formally appealing  this decision.  I understand the reason for this is that we do not have  postbronchodilator spirometry, but I am not willing to take the patient  off of his long-acting medication because of the severity of his  disease.  He has had frequent office visits for acute exacerbations even  on the aggressive medications.  Even if he does not demonstrate complete  reversibility on postbronchodilator spirometry, this does not mean he is  not a responder to Xolair.  At this point, I would ask that you look at  his case again and consider approving Xolair administration.  If he is  unable to get this, I suspect he is going to require high-dose long-term  steroids and therefore suffer the complications associated with this.  I  also  suspect he will require frequent office visits, ER visits, and  possibly hospitalizations because of the severity of his asthma with  inadequate control.  If I can answer any further questions that will aid  in my request, please feel free to call.    Sincerely,      [name], MD,FCCP  Electronically Signed    KMC/MedQ  DD: 01/09/2009  DT: 01/09/2009  Job #: [id number]

## 2010-10-09 NOTE — Cardiovascular Report (Signed)
NAME:  Levi [name], FIORITO NO.:  [id number]   MEDICAL RECORD NO.:  [id number]          PATIENT TYPE:  OIB   LOCATION:  2899                         FACILITY:  [facility]   PHYSICIAN:  Jake Bathe, MD      DATE OF BIRTH:  [birth year]   DATE OF PROCEDURE:  11/25/2008  DATE OF DISCHARGE:                            CARDIAC CATHETERIZATION   PROCEDURES:  1. Left heart catheterization.  2. Selective coronary angiography.  3. Left ventriculogram.  4. Abdominal aortogram.   INDICATION:  A 65 year old male with abnormal nuclear stress test  demonstrating ejection fraction of 43%, dilated left ventricle, and mild  inferior wall ischemia.  He has a history of COPD, taken care by Dr.  Shelle Iron and hyperlipidemia.   PROCEDURE DETAILS:  An informed consent was obtained.  Risk of stroke,  heart attack, death, renal impairment, bleeding, and arterial damage  were explained to the patient at length.  He was prepped and draped in  sterile fashion, placed on a catheterization table and prepped.  The  fluoroscopy was used to visualize the femoral head.  Lidocaine 1% was  used for local anesthesia.  Using the modified Seldinger technique, a 5-  French sheath was placed in the right femoral artery without any  difficulty.  A Judkins left #4 catheter and a Judkins right #4 catheter  were used to selectively cannulate the coronary arteries.  Multiple  views of hand injection of Omnipaque were obtained.  An angled pigtail  was used across in the left ventricle.  Hemodynamics were obtained.  Left ventriculogram in the RAO position utilizing 30 mL of contrast was  performed.  Pullback was obtained.  An abdominal aortogram at the level  above the renal arteries was performed utilizing 30 mL of contrast.  Following the procedure, catheters were removed, sheath was removed.  The patient was hemodynamically stable and tolerated this well.   FINDINGS:  1. Left main artery - no angiographically  significant coronary artery      disease branches into the left anterior descending artery and      circumflex artery.  2. Left anterior descending artery - there is calcification and minor      stenosis in the ostial region of 20%.  This does not appear to be      flow limiting.  This vessel has 1 diagonal branch with no      significant disease present.  3. Circumflex artery - there are 2 small obtuse marginal branches.      The third obtuse marginal branch is large and continues to the      apex.  In the second obtuse marginal branch, which is a vessel less      than 1 mm, there is mid stenosis of 75%.  This vessel was too small      for any intervention.  4. Right coronary artery - dominant vessel giving rise to the PDA.      There is no angiographically significant coronary artery disease      present.  5. Left ventriculogram.  Minor hypokinesis noted in  the apical region,      otherwise normal wall motion.  Ejection fraction is approximately      50%.  As noted on nuclear stress test, left ventricular cavity      dimension was slightly enlarged.  No MR noted.   HEMODYNAMICS:  Left ventricular systolic pressure 141 with an end-  diastolic pressure of 11 mmHg.  Aortic pressure is 141/65, with a mean  of 95 mmHg.  There is no aortic stenosis present.   Abdominal aortogram shows widely patent bilateral renal arteries with no  evidence of abdominal aortic aneurysm.   IMPRESSION:  1. Calcified ostial left anterior descending lesion of up to 20%      stenosis, which is not flow limiting, otherwise no angiographically      significant coronary artery disease.  2. Left ventricular ejection fraction of approximately 50% with mildly      dilated left ventricle and apical hypokinesis noted.  3. No abdominal aortic aneurysm.  No renal artery stenosis.   PLAN:  Continue to aggressively modify risk factors.  Monitor blood  pressure closely.  With his mildly dilated left ventricle, addition  of  an ACE inhibitor would be reasonable.  His blood pressure in clinic was  120/62, here in the cath lab was 141/65.  He should be able to tolerate  this.  I will reevaluate at clinic follow up in 2 weeks.  Continue  Lipitor 10 mg for hyperlipidemia.  Reassurance given.      Jake Bathe, MD  Electronically Signed     MCS/MEDQ  D:  11/25/2008  T:  11/25/2008  Job:  [id number]   cc:   [name], MD,FCCP  Joycelyn Rua, M.D.

## 2010-10-10 ENCOUNTER — Ambulatory Visit (INDEPENDENT_AMBULATORY_CARE_PROVIDER_SITE_OTHER): Payer: BC Managed Care – PPO

## 2010-10-10 DIAGNOSIS — J309 Allergic rhinitis, unspecified: Secondary | ICD-10-CM

## 2010-10-12 NOTE — Op Note (Signed)
NAME:  Levi [name], Levi [name] NO.:  [id number]   MEDICAL RECORD NO.:  [id number]          PATIENT TYPE:  AMB   LOCATION:  DSC                          FACILITY:  [facility]   PHYSICIAN:  Harvie Junior, M.D.   DATE OF BIRTH:  1945-11-11   DATE OF PROCEDURE:  07/11/2006  DATE OF DISCHARGE:                               OPERATIVE REPORT   PREOPERATIVE DIAGNOSES:  1. Lateral meniscal tear.  2. Large medial shelf plica.   POSTOPERATIVE DIAGNOSES:  1. Lateral meniscal tear.  2. Chondral injury lateral femoral condyle.  3. Chondromalacia of patellofemoral joint.  4. Debridement of medial shelf plica.   PROCEDURE PERFORMED:  1. Partial lateral meniscectomy with corresponding debridement in the      lateral femoral compartment.  2. Debridement of patellofemoral joint and the patellofemoral trochlea      predominantly.   SURGEON:  Harvie Junior, M.D.   ASSISTANT:  Marshia Ly, P.A.   ANESTHESIA:  General.   BRIEF HISTORY:  [name] is 65 year old male with a long history of  being a significant runner.  He has been having increasing pain in the  lateral joint line over a period of time.  We treated him conservatively  for a long period of time but he continued to have pain.  We talked  about treatment options.  Ultimately felt that operative arthroscopy was  the most appropriate course of action and brought to the operating room  with this procedure.   PROCEDURE:  The patient is brought to the operating room.  After  adequate anesthesia was obtained with general anesthetic, the patient  was placed up on the operating room table.  The left leg is prepped and  draped in the usual sterile fashion.  Following this, routine  arthroscopic examination revealed that there was a large medial shelf  plica.  The medial shelf plica was debrided and allowed entrance into  the medial compartment.  The medial compartment looked pristine.  There  was really no evidence of  arthritis.  The meniscus looked great, was  probed at length and felt to be stable.  ACL looked great.   Attention was turned to the lateral compartment.  The lateral meniscus  had a small mid body tear which was debrided.  They had a fairly large  probably 1 x 1-cm defect with a fragmenting piece of cartilage, this was  debrided back to a smooth and stable rim.  The lateral meniscus did  appear to be in position to help protect this area of the knee.   At this point, attention was turned into the patellofemoral compartment  and the patellofemoral trochlea.  There was some grade 2 changes.  I did  not want to take a shaver in this area so we just took a biter and the  loose flaps we bit off and then copiously and thoroughly irrigated the  knee with 6 liters of normal saline irrigation, sucked the knee dry and  then closed the knee portals with a bandage and a sterile compression  dressing was applied.  The  patient was taken to the recovery room, was  noted to be in satisfactory condition.   ESTIMATED BLOOD LOSS:  None.      Harvie Junior, M.D.  Electronically Signed     JLG/MEDQ  D:  07/11/2006  T:  07/12/2006  Job:  [id number]

## 2010-10-17 ENCOUNTER — Ambulatory Visit (INDEPENDENT_AMBULATORY_CARE_PROVIDER_SITE_OTHER): Payer: BC Managed Care – PPO

## 2010-10-17 DIAGNOSIS — J309 Allergic rhinitis, unspecified: Secondary | ICD-10-CM

## 2010-10-24 ENCOUNTER — Ambulatory Visit (INDEPENDENT_AMBULATORY_CARE_PROVIDER_SITE_OTHER): Payer: BC Managed Care – PPO

## 2010-10-24 DIAGNOSIS — J309 Allergic rhinitis, unspecified: Secondary | ICD-10-CM

## 2010-10-30 ENCOUNTER — Encounter: Payer: Self-pay | Admitting: Pulmonary Disease

## 2010-10-30 ENCOUNTER — Ambulatory Visit (INDEPENDENT_AMBULATORY_CARE_PROVIDER_SITE_OTHER): Payer: BC Managed Care – PPO

## 2010-10-30 DIAGNOSIS — J309 Allergic rhinitis, unspecified: Secondary | ICD-10-CM

## 2010-11-01 ENCOUNTER — Encounter: Payer: Self-pay | Admitting: Pulmonary Disease

## 2010-11-01 ENCOUNTER — Ambulatory Visit (INDEPENDENT_AMBULATORY_CARE_PROVIDER_SITE_OTHER): Payer: BC Managed Care – PPO | Admitting: Pulmonary Disease

## 2010-11-01 VITALS — BP 102/68 | HR 75 | Temp 97.6°F | Ht 74.0 in | Wt 167.4 lb

## 2010-11-01 DIAGNOSIS — J45909 Unspecified asthma, uncomplicated: Secondary | ICD-10-CM

## 2010-11-01 MED ORDER — PIRBUTEROL ACETATE 200 MCG/INH IN AERB: 2.0000 | INHALATION_SPRAY | Freq: Four times a day (QID) | RESPIRATORY_TRACT | Status: DC

## 2010-11-01 MED ORDER — BECLOMETHASONE DIPROPIONATE 80 MCG/ACT IN AERS: 2.0000 | INHALATION_SPRAY | Freq: Two times a day (BID) | RESPIRATORY_TRACT | Status: DC

## 2010-11-01 MED ORDER — PANTOPRAZOLE SODIUM 40 MG PO TBEC: 40.0000 mg | DELAYED_RELEASE_TABLET | Freq: Two times a day (BID) | ORAL | Status: DC

## 2010-11-01 MED ORDER — FORMOTEROL FUMARATE 12 MCG IN CAPS: 12.0000 ug | ORAL_CAPSULE | Freq: Two times a day (BID) | RESPIRATORY_TRACT | Status: DC

## 2010-11-01 NOTE — Assessment & Plan Note (Signed)
The pt overall is doing well, and is not far from his usual baseline.  I suspect he had a recent viral bronchitis, and now is getting back to his usual self.  His lungs are clear today, and would like to not make any other changes and give him time to improve.  He is in agreement with this approach.  He is to try and get his prednisone back to the 5mg  a day.

## 2010-11-01 NOTE — Progress Notes (Signed)
  Subjective:    Patient ID: Levi [name], male    DOB: [birth year], 65 y.o.   MRN: [id number]  HPI The pt comes in today for f/u of his asthma and ABPA.  He has done well overall, but a few weeks ago developed symptoms c/w ?viral bronchitis.  He had congestion and mucus, but now that has since resolved.  His asthma never really flared up, and now is settling back down.  Although he does not feel he is breathing "normal", he really is not having significant issues.  He increased his prednisone to 10mg  a day, and is going back to 5mg .     Review of Systems  Constitutional: Negative for fever and unexpected weight change.  HENT: Positive for sore throat and rhinorrhea. Negative for ear pain, nosebleeds, congestion, sneezing, trouble swallowing, dental problem, postnasal drip and sinus pressure.   Eyes: Negative for redness and itching.  Respiratory: Positive for cough and shortness of breath. Negative for chest tightness and wheezing.   Cardiovascular: Positive for leg swelling. Negative for palpitations.  Gastrointestinal: Negative for nausea and vomiting.  Genitourinary: Positive for dysuria.  Musculoskeletal: Negative for joint swelling.  Skin: Positive for rash.  Neurological: Negative for headaches.  Hematological: Bruises/bleeds easily.  Psychiatric/Behavioral: Negative for dysphoric mood. The patient is not nervous/anxious.        Objective:   Physical Exam Thin male in nad Chest totally clear to auscultation, no wheezing Cor with rrr LE with no edema, no cyanosis noted. Alert and oriented, moves all 4.         Assessment & Plan:

## 2010-11-01 NOTE — Patient Instructions (Signed)
No change in meds.  Let's give this another week or so and see how you do.  Please call if not doing well.   followup with me in 4mos.

## 2010-11-05 ENCOUNTER — Ambulatory Visit (INDEPENDENT_AMBULATORY_CARE_PROVIDER_SITE_OTHER): Payer: BC Managed Care – PPO

## 2010-11-05 ENCOUNTER — Telehealth: Payer: Self-pay | Admitting: Pulmonary Disease

## 2010-11-05 DIAGNOSIS — J309 Allergic rhinitis, unspecified: Secondary | ICD-10-CM

## 2010-11-05 NOTE — Telephone Encounter (Signed)
Levi [name], have you seen this PA in the stack?

## 2010-11-05 NOTE — Telephone Encounter (Signed)
I don't remember seeing a PA on this patient but it may be in the stack that I put back in triage. I will be working on them a little at a time this week according to date they were received. Not working on them this afternoon.

## 2010-11-05 NOTE — Telephone Encounter (Signed)
Per [place], PA form in [facility] lookat. I called and advised Jill Side of this, and advised KC is out of the office until next wk, but we will let her know when this is completed. She verbalized understanding.

## 2010-11-05 NOTE — Telephone Encounter (Signed)
Encounter closed in error- sent back to [facility]

## 2010-11-09 ENCOUNTER — Encounter: Payer: Self-pay | Admitting: Internal Medicine

## 2010-11-13 ENCOUNTER — Ambulatory Visit (INDEPENDENT_AMBULATORY_CARE_PROVIDER_SITE_OTHER): Payer: BC Managed Care – PPO

## 2010-11-13 DIAGNOSIS — J309 Allergic rhinitis, unspecified: Secondary | ICD-10-CM

## 2010-11-14 ENCOUNTER — Telehealth: Payer: Self-pay | Admitting: Pulmonary Disease

## 2010-11-14 NOTE — Telephone Encounter (Signed)
Form is in KC's very important look at folder.  KC, will you please fill out PA form today so we can process this PA for pt.  Thanks.

## 2010-11-14 NOTE — Telephone Encounter (Signed)
This PA was already initiated.  Per previous phone note, looks like it is still in KC's look at to be addressed.  Called [phone] provided above to see if we can still work off this fax, spoke with [employer].  Per Shon Hale, this has been closed out bc it took so long to receive a response but if we can get the physican to fill out the form, we can call back and give the answers over the phone for this to be processed.  Megan, pls advise if this is still in KC's to do.  If so, will you please have him fill the PA out today as pt is calling back about this.  Thank you!

## 2010-11-14 NOTE — Telephone Encounter (Signed)
Called [facility], spoke with [place].  She was informed of PA questions/answers per form completed by [facility].  Pantoprazole approved x 12 months -- will receive fax confirmation regarding this.    Called [facility], spoke with Jonny Ruiz.  He is aware of this.  [name] aware as well.

## 2010-11-14 NOTE — Telephone Encounter (Signed)
In the triage

## 2010-11-20 ENCOUNTER — Ambulatory Visit (INDEPENDENT_AMBULATORY_CARE_PROVIDER_SITE_OTHER): Payer: BC Managed Care – PPO

## 2010-11-20 DIAGNOSIS — J309 Allergic rhinitis, unspecified: Secondary | ICD-10-CM

## 2010-11-27 ENCOUNTER — Ambulatory Visit (INDEPENDENT_AMBULATORY_CARE_PROVIDER_SITE_OTHER): Payer: BC Managed Care – PPO

## 2010-11-27 DIAGNOSIS — J309 Allergic rhinitis, unspecified: Secondary | ICD-10-CM

## 2010-12-04 ENCOUNTER — Ambulatory Visit (INDEPENDENT_AMBULATORY_CARE_PROVIDER_SITE_OTHER): Payer: BC Managed Care – PPO

## 2010-12-04 DIAGNOSIS — J309 Allergic rhinitis, unspecified: Secondary | ICD-10-CM

## 2010-12-06 ENCOUNTER — Ambulatory Visit (INDEPENDENT_AMBULATORY_CARE_PROVIDER_SITE_OTHER): Payer: BC Managed Care – PPO

## 2010-12-06 DIAGNOSIS — J309 Allergic rhinitis, unspecified: Secondary | ICD-10-CM

## 2010-12-12 ENCOUNTER — Ambulatory Visit (INDEPENDENT_AMBULATORY_CARE_PROVIDER_SITE_OTHER): Payer: BC Managed Care – PPO

## 2010-12-12 DIAGNOSIS — J309 Allergic rhinitis, unspecified: Secondary | ICD-10-CM

## 2010-12-20 ENCOUNTER — Ambulatory Visit (INDEPENDENT_AMBULATORY_CARE_PROVIDER_SITE_OTHER): Payer: BC Managed Care – PPO

## 2010-12-20 DIAGNOSIS — J309 Allergic rhinitis, unspecified: Secondary | ICD-10-CM

## 2010-12-24 ENCOUNTER — Ambulatory Visit (INDEPENDENT_AMBULATORY_CARE_PROVIDER_SITE_OTHER): Payer: BC Managed Care – PPO

## 2010-12-24 ENCOUNTER — Ambulatory Visit (INDEPENDENT_AMBULATORY_CARE_PROVIDER_SITE_OTHER): Payer: BC Managed Care – PPO | Admitting: Internal Medicine

## 2010-12-24 ENCOUNTER — Encounter: Payer: Self-pay | Admitting: Internal Medicine

## 2010-12-24 VITALS — BP 100/68 | HR 87 | Ht 74.0 in | Wt 164.8 lb

## 2010-12-24 DIAGNOSIS — J45909 Unspecified asthma, uncomplicated: Secondary | ICD-10-CM

## 2010-12-24 DIAGNOSIS — J301 Allergic rhinitis due to pollen: Secondary | ICD-10-CM

## 2010-12-24 DIAGNOSIS — R21 Rash and other nonspecific skin eruption: Secondary | ICD-10-CM

## 2010-12-24 DIAGNOSIS — J309 Allergic rhinitis, unspecified: Secondary | ICD-10-CM

## 2010-12-24 NOTE — Assessment & Plan Note (Signed)
He has appointment with dermatologist next week about a different skin issue on is back. I asked him to wait on putting steroid cream on the hand lesion, until dermatologist could see that as well.

## 2010-12-24 NOTE — Progress Notes (Signed)
Subjective:    Patient ID: Levi [name], male    DOB: [birth year], 65 y.o.   MRN: [id number]  HPI 12/24/10- 58 yoM never smoker followed for allergy/ asthma, rhinitis by Dr Maple Hudson, for asthma/ hx ABPA by Dr Shelle Iron Last here June 25, 2010 Last visit he had a rash on his hand. That resolved with topical steroid, but has recently recurred in the same area on dorsum right hand. Allergy vaccine- 1:10 GH Had more asthma at end of April- saw Dr Shelle Iron. Maintenance prednisone was briefly increased, now back to 5 mg daily. Has needed rescue inhaler only 2-3x since then.  Occasional stuffy nose- uses saline nasal rinse.  CXR Feb, 2012- clear, NAD.   Review of Systems Constitutional:   No-   weight loss, night sweats, fevers, chills, fatigue, lassitude. HEENT:   No-   headaches, difficulty swallowing, tooth/dental problems, sore throat,                  No-   sneezing, itching, ear ache, nasal congestion, post nasal drip,   CV:  No-   chest pain, orthopnea, PND, swelling in lower extremities, anasarca, dizziness, palpitations  GI:  No-   heartburn, indigestion, abdominal pain, nausea, vomiting, diarrhea,                 change in bowel habits, loss of appetite  Resp: No-   shortness of breath with exertion or at rest.  No-  excess mucus,             No-   productive cough,  No non-productive cough,  No-  coughing up of blood.              No-   change in color of mucus.  No- wheezing.    Skin: rash dorsum right hand-itches  GU: No-   dysuria, change in color of urine, no urgency or frequency.  No- flank pain.  MS:  No-   joint pain or swelling.  No- decreased range of motion.  No- back pain.  Psych:  No- change in mood or affect. No depression or anxiety.  No memory loss.      Objective:   Physical Exam General- Alert, Oriented, Affect-appropriate, Distress- none acute   Tall, slender, relaxed Skin- , excoriation- none + 1- 2 cm diam raised lesion dorsum right hand, raised margin, central  scale,  Lymphadenopathy- none Head- atraumatic            Eyes- Gross vision intact, PERRLA, conjunctivae clear secretions            Ears- Hearing, canals normal            Nose- Clear, Septal dev, mucus, polyps, erosion, perforation             Throat- Mallampati II , mucosa clear , drainage- none, tonsils- atrophic    Teeth crowned Neck- flexible , trachea midline, no stridor , thyroid nl, carotid no bruit Chest - symmetrical excursion , unlabored           Heart/CV- RRR , no murmur , no gallop  , no rub, nl s1 s2                           - JVD- none , edema- none, stasis changes- none, varices- none           Lung- clear to P&A, wheeze- none, cough- none , dullness-none, rub- none  Chest wall-  Abd- tender-no, distended-no, bowel sounds-present, HSM- no Br/ Gen/ Rectal- Not done, not indicated Extrem- cyanosis- none, clubbing, none, atrophy- none, strength- nl Neuro- grossly intact to observation         Assessment & Plan:   No problem-specific assessment & plan notes found for this encounter.

## 2010-12-24 NOTE — Assessment & Plan Note (Signed)
Generally good control. Some break-through in April, required intervention then. We will continue vaccine at 1:10 this year.

## 2010-12-24 NOTE — Patient Instructions (Signed)
Shoe your dermatologist the rash on your hand before you start treating it.   Continue allergy vaccine at 1:10

## 2010-12-24 NOTE — Assessment & Plan Note (Signed)
Good control today. Clear CXR in February, pertinent for hx ABPA. Continues maintenance low dose prednisone

## 2011-01-01 ENCOUNTER — Ambulatory Visit (INDEPENDENT_AMBULATORY_CARE_PROVIDER_SITE_OTHER): Payer: BC Managed Care – PPO

## 2011-01-01 DIAGNOSIS — J309 Allergic rhinitis, unspecified: Secondary | ICD-10-CM

## 2011-01-07 ENCOUNTER — Ambulatory Visit (INDEPENDENT_AMBULATORY_CARE_PROVIDER_SITE_OTHER): Payer: BC Managed Care – PPO

## 2011-01-07 DIAGNOSIS — J309 Allergic rhinitis, unspecified: Secondary | ICD-10-CM

## 2011-01-14 ENCOUNTER — Ambulatory Visit (INDEPENDENT_AMBULATORY_CARE_PROVIDER_SITE_OTHER): Payer: BC Managed Care – PPO

## 2011-01-14 DIAGNOSIS — J309 Allergic rhinitis, unspecified: Secondary | ICD-10-CM

## 2011-01-22 ENCOUNTER — Ambulatory Visit (INDEPENDENT_AMBULATORY_CARE_PROVIDER_SITE_OTHER): Payer: BC Managed Care – PPO

## 2011-01-22 DIAGNOSIS — J309 Allergic rhinitis, unspecified: Secondary | ICD-10-CM

## 2011-01-29 ENCOUNTER — Ambulatory Visit (INDEPENDENT_AMBULATORY_CARE_PROVIDER_SITE_OTHER): Payer: BC Managed Care – PPO

## 2011-01-29 DIAGNOSIS — J309 Allergic rhinitis, unspecified: Secondary | ICD-10-CM

## 2011-02-04 ENCOUNTER — Ambulatory Visit (INDEPENDENT_AMBULATORY_CARE_PROVIDER_SITE_OTHER): Payer: BC Managed Care – PPO

## 2011-02-04 DIAGNOSIS — J309 Allergic rhinitis, unspecified: Secondary | ICD-10-CM

## 2011-02-11 ENCOUNTER — Ambulatory Visit (INDEPENDENT_AMBULATORY_CARE_PROVIDER_SITE_OTHER): Payer: BC Managed Care – PPO

## 2011-02-11 DIAGNOSIS — J309 Allergic rhinitis, unspecified: Secondary | ICD-10-CM

## 2011-02-18 ENCOUNTER — Ambulatory Visit (INDEPENDENT_AMBULATORY_CARE_PROVIDER_SITE_OTHER): Payer: BC Managed Care – PPO

## 2011-02-18 DIAGNOSIS — J309 Allergic rhinitis, unspecified: Secondary | ICD-10-CM

## 2011-02-19 ENCOUNTER — Other Ambulatory Visit: Payer: Self-pay | Admitting: Pulmonary Disease

## 2011-02-19 ENCOUNTER — Encounter: Payer: Self-pay | Admitting: Internal Medicine

## 2011-02-22 LAB — DIFFERENTIAL
Basophils Absolute: 0
Basophils Relative: 1
Eosinophils Absolute: 0.7
Eosinophils Relative: 8 — ABNORMAL HIGH
Lymphocytes Relative: 27
Lymphs Abs: 2.4
Monocytes Absolute: 0.9
Monocytes Relative: 9
Neutro Abs: 5.1
Neutrophils Relative %: 56

## 2011-02-22 LAB — BASIC METABOLIC PANEL
BUN: 20
CO2: 24
Calcium: 9.6
Chloride: 104
Creatinine, Ser: 0.86
GFR calc Af Amer: >60
GFR calc non Af Amer: >60
Glucose, Bld: 105 — ABNORMAL HIGH
Potassium: 4.2
Sodium: 138

## 2011-02-22 LAB — CBC
HCT: 47.4
Hemoglobin: 16.1
MCHC: 34.1
MCV: 97.9
Platelets: 137 — ABNORMAL LOW
RBC: 4.84
RDW: 13.3
WBC: 9.2

## 2011-02-22 LAB — B-NATRIURETIC PEPTIDE (CONVERTED LAB): Pro B Natriuretic peptide (BNP): <30

## 2011-02-25 ENCOUNTER — Ambulatory Visit (INDEPENDENT_AMBULATORY_CARE_PROVIDER_SITE_OTHER): Payer: BC Managed Care – PPO

## 2011-02-25 DIAGNOSIS — J309 Allergic rhinitis, unspecified: Secondary | ICD-10-CM

## 2011-02-27 LAB — CBC
HCT: 43.2
HCT: 46
Hemoglobin: 14.7
Hemoglobin: 15.7
MCHC: 34.1
MCHC: 34.1
MCV: 100.3 — ABNORMAL HIGH
MCV: 99.8
Platelets: 159
Platelets: 169
RBC: 4.33
RBC: 4.58
RDW: 13.1
RDW: 13.3
WBC: 6
WBC: 7.9

## 2011-02-27 LAB — COMPREHENSIVE METABOLIC PANEL
ALT: 20
ALT: 21
AST: 24
AST: 34
Albumin: 3.7
Albumin: 4.1
Alkaline Phosphatase: 67
Alkaline Phosphatase: 69
BUN: 17
BUN: 23
CO2: 29
CO2: 29
Calcium: 8.9
Calcium: 9.3
Chloride: 106
Chloride: 107
Creatinine, Ser: 0.91
Creatinine, Ser: 1.02
GFR calc Af Amer: >60
GFR calc Af Amer: >60
GFR calc non Af Amer: >60
GFR calc non Af Amer: >60
Glucose, Bld: 80
Glucose, Bld: 89
Potassium: 3.4 — ABNORMAL LOW
Potassium: 4.3
Sodium: 138
Sodium: 139
Total Bilirubin: 0.7
Total Bilirubin: 1.2
Total Protein: 5.9 — ABNORMAL LOW
Total Protein: 6.4

## 2011-02-27 LAB — CK TOTAL AND CKMB (NOT AT ARMC)
CK, MB: 2.8
CK, MB: 3
Relative Index: INVALID
Relative Index: INVALID
Total CK: 55
Total CK: 61

## 2011-02-27 LAB — URINALYSIS, ROUTINE W REFLEX MICROSCOPIC
Bilirubin Urine: NEGATIVE
Bilirubin Urine: NEGATIVE
Glucose, UA: NEGATIVE
Glucose, UA: NEGATIVE
Hgb urine dipstick: NEGATIVE
Hgb urine dipstick: NEGATIVE
Ketones, ur: NEGATIVE
Ketones, ur: NEGATIVE
Nitrite: NEGATIVE
Nitrite: NEGATIVE
Protein, ur: NEGATIVE
Protein, ur: NEGATIVE
Specific Gravity, Urine: 1.017
Specific Gravity, Urine: 1.021
Urobilinogen, UA: 0.2
Urobilinogen, UA: 0.2
pH: 5.5
pH: 8

## 2011-02-27 LAB — POCT CARDIAC MARKERS
CKMB, poc: 1.2
Myoglobin, poc: 71.7
Troponin i, poc: <0.05

## 2011-02-27 LAB — DIFFERENTIAL
Basophils Absolute: 0
Basophils Relative: 1
Eosinophils Absolute: 1 — ABNORMAL HIGH
Eosinophils Relative: 16 — ABNORMAL HIGH
Lymphocytes Relative: 21
Lymphs Abs: 1.2
Monocytes Absolute: 0.6
Monocytes Relative: 9
Neutro Abs: 3.2
Neutrophils Relative %: 53

## 2011-02-27 LAB — LIPASE, BLOOD
Lipase: 25
Lipase: 28

## 2011-02-27 LAB — TROPONIN I: Troponin I: 0.04

## 2011-02-27 LAB — D-DIMER, QUANTITATIVE (NOT AT ARMC): D-Dimer, Quant: 0.35

## 2011-03-04 ENCOUNTER — Ambulatory Visit (INDEPENDENT_AMBULATORY_CARE_PROVIDER_SITE_OTHER): Payer: BC Managed Care – PPO

## 2011-03-04 DIAGNOSIS — J309 Allergic rhinitis, unspecified: Secondary | ICD-10-CM

## 2011-03-07 ENCOUNTER — Encounter: Payer: Self-pay | Admitting: Pulmonary Disease

## 2011-03-07 ENCOUNTER — Ambulatory Visit (INDEPENDENT_AMBULATORY_CARE_PROVIDER_SITE_OTHER): Payer: BC Managed Care – PPO | Admitting: Pulmonary Disease

## 2011-03-07 VITALS — BP 132/78 | HR 66 | Temp 97.5°F | Ht 75.0 in | Wt 165.6 lb

## 2011-03-07 DIAGNOSIS — J45909 Unspecified asthma, uncomplicated: Secondary | ICD-10-CM

## 2011-03-07 NOTE — Assessment & Plan Note (Signed)
The patient had been doing very well from an asthma standpoint, but had a mild flare with the abrupt change to cold weather.  He has taken a short pulse of prednisone, and feels that he is back to his usual baseline.  He has no wheezes on exam today, and no increased work of breathing.  I've asked him to go back to his usual 5 mg a day of prednisone, and we'll leave him on his current maintenance medications.

## 2011-03-07 NOTE — Progress Notes (Signed)
  Subjective:    Patient ID: Levi [name], male    DOB: 1945/08/05, 65 y.o.   MRN: [id number]  HPI The patient comes in today for followup of his known asthma.  With the recent cold weather and dampness, the patient had increased shortness of breath with wheezing.  He normally stays on 5 mg of prednisone a day chronically, and boosted his dose over a 4-6 day period.  He currently is on 10 mg a day, and feels that he is nearly back to his usual baseline.  He denies any cough or congestion.   Review of Systems  Constitutional: Negative for fever and unexpected weight change.  HENT: Positive for congestion and sore throat. Negative for ear pain, nosebleeds, rhinorrhea, sneezing, trouble swallowing, dental problem, postnasal drip and sinus pressure.   Eyes: Negative for redness and itching.  Respiratory: Positive for cough, chest tightness and shortness of breath. Negative for wheezing.   Cardiovascular: Negative for palpitations and leg swelling.  Gastrointestinal: Negative for nausea and vomiting.  Genitourinary: Negative for dysuria.  Musculoskeletal: Negative for joint swelling.  Skin: Negative for rash.  Neurological: Negative for headaches.  Hematological: Does not bruise/bleed easily.  Psychiatric/Behavioral: Negative for dysphoric mood. The patient is not nervous/anxious.        Objective:   Physical Exam Well-developed male in no acute distress Nose without purulence or discharge noted Chest totally clear to auscultation, no wheezes or rhonchi Cardiac exam with regular rate and rhythm Lower extremities without edema, no cyanosis noted Alert and oriented, moves all 4 extremities.       Assessment & Plan:

## 2011-03-07 NOTE — Patient Instructions (Signed)
Decrease prednisone down to 5mg  a day (your usual dose) No change in breathing meds. followup with me in 4mos, but call if having issues.

## 2011-03-11 ENCOUNTER — Ambulatory Visit (INDEPENDENT_AMBULATORY_CARE_PROVIDER_SITE_OTHER): Payer: BC Managed Care – PPO

## 2011-03-11 DIAGNOSIS — J309 Allergic rhinitis, unspecified: Secondary | ICD-10-CM

## 2011-03-18 ENCOUNTER — Ambulatory Visit (INDEPENDENT_AMBULATORY_CARE_PROVIDER_SITE_OTHER): Payer: BC Managed Care – PPO

## 2011-03-18 DIAGNOSIS — J309 Allergic rhinitis, unspecified: Secondary | ICD-10-CM

## 2011-03-25 ENCOUNTER — Ambulatory Visit (INDEPENDENT_AMBULATORY_CARE_PROVIDER_SITE_OTHER): Payer: BC Managed Care – PPO

## 2011-03-25 DIAGNOSIS — J309 Allergic rhinitis, unspecified: Secondary | ICD-10-CM

## 2011-04-01 ENCOUNTER — Ambulatory Visit (INDEPENDENT_AMBULATORY_CARE_PROVIDER_SITE_OTHER): Payer: BC Managed Care – PPO

## 2011-04-01 DIAGNOSIS — J309 Allergic rhinitis, unspecified: Secondary | ICD-10-CM

## 2011-04-08 ENCOUNTER — Ambulatory Visit (INDEPENDENT_AMBULATORY_CARE_PROVIDER_SITE_OTHER): Payer: BC Managed Care – PPO

## 2011-04-08 DIAGNOSIS — J309 Allergic rhinitis, unspecified: Secondary | ICD-10-CM

## 2011-04-09 ENCOUNTER — Ambulatory Visit (INDEPENDENT_AMBULATORY_CARE_PROVIDER_SITE_OTHER): Payer: BC Managed Care – PPO

## 2011-04-09 DIAGNOSIS — J309 Allergic rhinitis, unspecified: Secondary | ICD-10-CM

## 2011-04-11 ENCOUNTER — Ambulatory Visit (INDEPENDENT_AMBULATORY_CARE_PROVIDER_SITE_OTHER): Payer: BC Managed Care – PPO | Admitting: Adult Health

## 2011-04-11 ENCOUNTER — Encounter: Payer: Self-pay | Admitting: Adult Health

## 2011-04-11 DIAGNOSIS — J069 Acute upper respiratory infection, unspecified: Secondary | ICD-10-CM

## 2011-04-11 MED ORDER — CEFDINIR 300 MG PO CAPS: 300.0000 mg | ORAL_CAPSULE | Freq: Two times a day (BID) | ORAL | Status: AC

## 2011-04-11 NOTE — Progress Notes (Signed)
Subjective:    Patient ID: Levi [name], male    DOB: Sep 24, 1945, 65 y.o.   MRN: [id number]  HPI 58 yoM never smoker followed for allergy/ asthma, rhinitis by Dr Maple Hudson, for asthma/ hx ABPA by Dr Shelle Iron Last here June 25, 2010 Last visit he had a rash on his hand. That resolved with topical steroid, but has recently recurred in the same area on dorsum right hand. Allergy vaccine- 1:10 GH Had more asthma at end of April- saw Dr Shelle Iron. Maintenance prednisone was briefly increased, now back to 5 mg daily. Has needed rescue inhaler only 2-3x since then.  Occasional stuffy nose- uses saline nasal rinse.  CXR Feb, 2012- clear, NAD.   04/11/2011 Acute OV  Complains of sore throat, PND, sinus pressure, prod cough with green mucus, some wheezing, fever x1day. Says he has mild body aches. Temp this am was 101.5  Currently on prednisone 10mg  daily .  Took tylenol this am.  No recent travel or abx use. No chest pain or hemoptysis     Review of Systems  Constitutional:   No-   weight loss, night sweats, fevers, chills, fatigue, lassitude. HEENT:   No-   headaches, difficulty swallowing, tooth/dental problems, ++ sore throat,                  No-   sneezing, itching, ear ache,  ++nasal congestion, post nasal drip,   CV:  No-   chest pain, orthopnea, PND, swelling in lower extremities, anasarca, dizziness, palpitations  GI:  No-   heartburn, indigestion, abdominal pain, nausea, vomiting, diarrhea,                 change in bowel habits, loss of appetite  Resp: No-   shortness of breath with exertion or at rest.   ++ excess mucus,            ++ productive cough,   No non-productive cough,  No-  coughing up of blood.              No-   change in color of mucus.  No- wheezing.    Skin: No rash   GU: No-   dysuria, change in color of urine, no urgency or frequency.  No- flank pain.  MS:  No-   joint pain or swelling.  No- decreased range of motion.  No- back pain.  Psych:  No- change in  mood or affect. No depression or anxiety.  No memory loss.      Objective:   Physical Exam  General- Alert, Oriented, Affect-appropriate, Distress- none acute   Tall, slender, relaxed Skin- , clear  Lymphadenopathy- none Head- atraumatic            Eyes- Gross vision intact, PERRLA, conjunctivae clear secretions            Ears- Hearing, canals normal            Nose- Clear, Septal dev, mucus, polyps, erosion, perforation             Throat- Mallampati II , mucosa clear , drainage- none, tonsils- atrophic    Teeth crowned Neck- flexible , trachea midline, no stridor , thyroid nl, carotid no bruit Chest - symmetrical excursion , unlabored           Heart/CV- RRR , no murmur , no gallop  , no rub, nl s1 s2                           -  JVD- none , edema- none, stasis changes- none, varices- none           Lung- clear to P&A, wheeze- none, cough- none , dullness-none, rub- none           Chest wall-  Abd- tender-no, distended-no, bowel sounds-present, HSM- no Br/ Gen/ Rectal- Not done, not indicated Extrem- cyanosis- none, clubbing, none, atrophy- none, strength- nl Neuro- grossly intact to observation         Assessment & Plan:   No problem-specific assessment & plan notes found for this encounter.

## 2011-04-11 NOTE — Patient Instructions (Signed)
Omnicef 300mg  Twice daily  For 7days --to have on hold if symptoms worsen with discolored mucus or do not improve.  Mucinex DM Twice daily  As needed  Cough/congestion  Fluids and rest  Tylenol As needed  Fever  Saline nasal rinses As needed   Salt water gargles.  Please contact office for sooner follow up if symptoms do not improve or worsen or seek emergency care  follow up Dr. Shelle Iron as planned and As needed

## 2011-04-11 NOTE — Assessment & Plan Note (Signed)
Suspect this is viral URI  Plan:   Omnicef 300mg  Twice daily  For 7days --to have on hold if symptoms worsen with discolored mucus or do not improve.  Mucinex DM Twice daily  As needed  Cough/congestion  Fluids and rest  Tylenol As needed  Fever  Saline nasal rinses As needed   Salt water gargles.  Please contact office for sooner follow up if symptoms do not improve or worsen or seek emergency care  follow up Dr. Shelle Iron as planned and As needed

## 2011-04-15 ENCOUNTER — Ambulatory Visit (INDEPENDENT_AMBULATORY_CARE_PROVIDER_SITE_OTHER): Payer: BC Managed Care – PPO

## 2011-04-15 DIAGNOSIS — J309 Allergic rhinitis, unspecified: Secondary | ICD-10-CM

## 2011-04-15 NOTE — Progress Notes (Signed)
Ov reviewed, and agree with plan as outlined.  

## 2011-04-22 ENCOUNTER — Ambulatory Visit (INDEPENDENT_AMBULATORY_CARE_PROVIDER_SITE_OTHER): Payer: BC Managed Care – PPO

## 2011-04-22 DIAGNOSIS — J309 Allergic rhinitis, unspecified: Secondary | ICD-10-CM

## 2011-04-29 ENCOUNTER — Ambulatory Visit (INDEPENDENT_AMBULATORY_CARE_PROVIDER_SITE_OTHER): Payer: BC Managed Care – PPO

## 2011-04-29 DIAGNOSIS — J309 Allergic rhinitis, unspecified: Secondary | ICD-10-CM

## 2011-05-02 ENCOUNTER — Other Ambulatory Visit: Payer: Self-pay | Admitting: Pulmonary Disease

## 2011-05-06 ENCOUNTER — Ambulatory Visit (INDEPENDENT_AMBULATORY_CARE_PROVIDER_SITE_OTHER): Payer: BC Managed Care – PPO

## 2011-05-06 DIAGNOSIS — J309 Allergic rhinitis, unspecified: Secondary | ICD-10-CM

## 2011-05-14 ENCOUNTER — Ambulatory Visit (INDEPENDENT_AMBULATORY_CARE_PROVIDER_SITE_OTHER): Payer: BC Managed Care – PPO

## 2011-05-14 DIAGNOSIS — J309 Allergic rhinitis, unspecified: Secondary | ICD-10-CM

## 2011-05-16 ENCOUNTER — Telehealth: Payer: Self-pay | Admitting: Pulmonary Disease

## 2011-05-16 ENCOUNTER — Ambulatory Visit (INDEPENDENT_AMBULATORY_CARE_PROVIDER_SITE_OTHER)
Admission: RE | Admit: 2011-05-16 | Discharge: 2011-05-16 | Disposition: A | Discharge status: Home / Self Care | Payer: BC Managed Care – PPO | Source: Ambulatory Visit | Attending: Adult Health | Admitting: Adult Health

## 2011-05-16 ENCOUNTER — Ambulatory Visit (INDEPENDENT_AMBULATORY_CARE_PROVIDER_SITE_OTHER): Payer: BC Managed Care – PPO | Admitting: Adult Health

## 2011-05-16 ENCOUNTER — Encounter: Payer: Self-pay | Admitting: Adult Health

## 2011-05-16 VITALS — BP 106/60 | HR 61 | Temp 98.5°F | Ht 74.0 in | Wt 165.0 lb

## 2011-05-16 DIAGNOSIS — J45909 Unspecified asthma, uncomplicated: Secondary | ICD-10-CM

## 2011-05-16 DIAGNOSIS — R0602 Shortness of breath: Secondary | ICD-10-CM

## 2011-05-16 NOTE — Progress Notes (Signed)
  Subjective:    Patient ID: Levi [name], male    DOB: [birth year], 65 y.o.   MRN: [id number]  HPI 78 yoM never smoker followed for allergy/ asthma, rhinitis by Dr Maple Hudson, for asthma/ hx ABPA by Dr Shelle Iron Last here June 25, 2010 Last visit he had a rash on his hand. That resolved with topical steroid, but has recently recurred in the same area on dorsum right hand. Allergy vaccine- 1:10 GH Had more asthma at end of April- saw Dr Shelle Iron. Maintenance prednisone was briefly increased, now back to 5 mg daily. Has needed rescue inhaler only 2-3x since then.  Occasional stuffy nose- uses saline nasal rinse.  CXR Feb, 2012- clear, NAD.   04/11/2011 Acute OV  Complains of sore throat, PND, sinus pressure, prod cough with green mucus, some wheezing, fever x1day. Says he has mild body aches. Temp this am was 101.5  Currently on prednisone 10mg  daily .  Took tylenol this am.  No recent travel or abx use. No chest pain or hemoptysis   05/16/2011 Acute OV  Has not been feeling good for last 3 days with more coughing /wheezing. This morning woke up with wheezing/dry cough and tightness. No fever or hemotpysis. No discolored mucus. No edema or chest pain. OTC not helping . Currently on prednisone 5mg  daily.    Review of Systems  Constitutional:   No-   weight loss, night sweats, fevers, chills, fatigue, lassitude. HEENT:   No-   headaches, difficulty swallowing, tooth/dental problems,sore throat,                  No-   sneezing, itching, ear ache, nasal congestion, post nasal drip,   CV:  No-   chest pain, orthopnea, PND, swelling in lower extremities, anasarca, dizziness, palpitations  GI:  No-   heartburn, indigestion, abdominal pain, nausea, vomiting, diarrhea,                 change in bowel habits, loss of appetite  Resp: No-   shortness of breath with exertion or at rest.      No-  coughing up of blood.              No-   change in color of mucus.    Skin: No rash   GU: No-   dysuria,  change in color of urine, no urgency or frequency.  No- flank pain.  MS:  No-   joint pain or swelling.  No- decreased range of motion.  No- back pain.  Psych:  No- change in mood or affect. No depression or anxiety.  No memory loss.      Objective:   Physical Exam  GEN: A/Ox3; pleasant , NAD, elderly   HEENT:  [place]/AT,  EACs-clear, TMs-wnl, NOSE-clear, THROAT-clear, no lesions, no postnasal drip or exudate noted.   NECK:  Supple w/ fair ROM; no JVD; normal carotid impulses w/o bruits; no thyromegaly or nodules palpated; no lymphadenopathy.  RESP  Coarse BS w/ exp wheezing   CARD:  RRR, no m/r/g  , no peripheral edema, pulses intact, no cyanosis or clubbing.  GI:   Soft & nt; nml bowel sounds; no organomegaly or masses detected.  Musco: Warm bil, no deformities or joint swelling noted.   Neuro: alert, no focal deficits noted.    Skin: Warm, no lesions or rashes

## 2011-05-16 NOTE — Patient Instructions (Signed)
Increase Prednisone 10mg  4 tabs for 2 days, then 3 tabs for 2 days, 2 tabs for 2 days, then back to 1 daily .  Please contact office for sooner follow up if symptoms do not improve or worsen or seek emergency care  I will call with xray  follow up Dr. Shelle Iron as planned and As needed

## 2011-05-16 NOTE — Telephone Encounter (Signed)
I spoke with spouse and she states pt has been having some SOB and congestion. Pt is scheduled to come in and see TP today at 3:30.

## 2011-05-17 ENCOUNTER — Telehealth: Payer: Self-pay | Admitting: Adult Health

## 2011-05-17 MED ORDER — PREDNISONE 10 MG PO TABS: ORAL_TABLET | ORAL | Status: DC

## 2011-05-17 NOTE — Progress Notes (Signed)
Ov reviewed, and agree with plan as outlined.  

## 2011-05-17 NOTE — Assessment & Plan Note (Signed)
Flare :  cxr pending.   Plan:  Increase Prednisone 10mg  4 tabs for 2 days, then 3 tabs for 2 days, 2 tabs for 2 days, then back to 1 daily .  Please contact office for sooner follow up if symptoms do not improve or worsen or seek emergency care  I will call with xray  follow up Dr. Shelle Iron as planned and As needed

## 2011-05-17 NOTE — Telephone Encounter (Signed)
Pt states Pred taper was not called to pharmacy and does not have enough. rx refill sent to pharmacy.

## 2011-05-22 ENCOUNTER — Ambulatory Visit (INDEPENDENT_AMBULATORY_CARE_PROVIDER_SITE_OTHER): Payer: BC Managed Care – PPO

## 2011-05-22 DIAGNOSIS — J309 Allergic rhinitis, unspecified: Secondary | ICD-10-CM

## 2011-05-23 ENCOUNTER — Other Ambulatory Visit: Payer: Self-pay | Admitting: Pulmonary Disease

## 2011-05-27 ENCOUNTER — Ambulatory Visit (INDEPENDENT_AMBULATORY_CARE_PROVIDER_SITE_OTHER): Payer: BC Managed Care – PPO

## 2011-05-27 DIAGNOSIS — J309 Allergic rhinitis, unspecified: Secondary | ICD-10-CM

## 2011-06-04 ENCOUNTER — Ambulatory Visit (INDEPENDENT_AMBULATORY_CARE_PROVIDER_SITE_OTHER): Payer: BC Managed Care – PPO

## 2011-06-04 DIAGNOSIS — J309 Allergic rhinitis, unspecified: Secondary | ICD-10-CM

## 2011-06-10 ENCOUNTER — Ambulatory Visit (INDEPENDENT_AMBULATORY_CARE_PROVIDER_SITE_OTHER): Payer: BC Managed Care – PPO

## 2011-06-10 DIAGNOSIS — J309 Allergic rhinitis, unspecified: Secondary | ICD-10-CM

## 2011-06-17 ENCOUNTER — Ambulatory Visit (INDEPENDENT_AMBULATORY_CARE_PROVIDER_SITE_OTHER): Payer: BC Managed Care – PPO

## 2011-06-17 DIAGNOSIS — J309 Allergic rhinitis, unspecified: Secondary | ICD-10-CM

## 2011-06-26 ENCOUNTER — Encounter: Payer: Self-pay | Admitting: Internal Medicine

## 2011-06-26 ENCOUNTER — Ambulatory Visit (INDEPENDENT_AMBULATORY_CARE_PROVIDER_SITE_OTHER): Payer: BC Managed Care – PPO

## 2011-06-26 ENCOUNTER — Ambulatory Visit (INDEPENDENT_AMBULATORY_CARE_PROVIDER_SITE_OTHER): Payer: BC Managed Care – PPO | Admitting: Internal Medicine

## 2011-06-26 VITALS — BP 100/62 | HR 57 | Ht 74.0 in | Wt 170.0 lb

## 2011-06-26 DIAGNOSIS — J45909 Unspecified asthma, uncomplicated: Secondary | ICD-10-CM

## 2011-06-26 DIAGNOSIS — J309 Allergic rhinitis, unspecified: Secondary | ICD-10-CM

## 2011-06-26 DIAGNOSIS — J069 Acute upper respiratory infection, unspecified: Secondary | ICD-10-CM

## 2011-06-26 DIAGNOSIS — J301 Allergic rhinitis due to pollen: Secondary | ICD-10-CM

## 2011-06-26 MED ORDER — METHYLPREDNISOLONE ACETATE 80 MG/ML IJ SUSP
80.0000 mg | Freq: Once | INTRAMUSCULAR | Status: AC
  Administered 2011-06-26: 80 mg via INTRAMUSCULAR

## 2011-06-26 MED ORDER — LEVALBUTEROL HCL 0.63 MG/3ML IN NEBU
0.6300 mg | INHALATION_SOLUTION | Freq: Once | RESPIRATORY_TRACT | Status: AC
  Administered 2011-06-26: 0.63 mg via RESPIRATORY_TRACT

## 2011-06-26 MED ORDER — AZITHROMYCIN 250 MG PO TABS: ORAL_TABLET | ORAL | Status: AC

## 2011-06-26 NOTE — Patient Instructions (Signed)
Neb xop 0.63  Depo 80  Script sent for Z pak  Continue allergy vaccine

## 2011-06-26 NOTE — Progress Notes (Signed)
Patient ID: Levi [name], male    DOB: [birth year], 66 y.o.   MRN: [id number]  HPI 12/24/10- 57 yoM never smoker followed for allergy/ asthma, rhinitis by Dr Maple Hudson, for asthma/ hx ABPA by Dr Shelle Iron Last here June 25, 2010 Last visit he had a rash on his hand. That resolved with topical steroid, but has recently recurred in the same area on dorsum right hand. Allergy vaccine- 1:10 GH Had more asthma at end of April- saw Dr Shelle Iron. Maintenance prednisone was briefly increased, now back to 5 mg daily. Has needed rescue inhaler only 2-3x since then.  Occasional stuffy nose- uses saline nasal rinse.  CXR Feb, 2012- clear, NAD.   06/26/11- 64 yoM never smoker followed for allergy/ asthma, rhinitis by Dr Maple Hudson, for asthma/ hx ABPA by Dr Shelle Iron He was itching and sneezing a few days ago but that resolved. He had a temperature up to 100.3 with sweating at night, light green sputum, raspy crackle in his chest. He felt that he had a cold starting to get better. Denies sore throat sinus pain or chest pain. Continues allergy vaccine without problems and is satisfied that it is helpful. Continues prednisone maintenance 5 mg daily. Has not had significant exacerbation of wheezing with his recent acute illness.  ROS-see HPI Constitutional:   No-   weight loss, night sweats, fevers, chills, fatigue, lassitude. HEENT:   No-  headaches, difficulty swallowing, tooth/dental problems, sore throat,       +  sneezing, itching, ear ache, nasal congestion, post nasal drip,  CV:  No-   chest pain, orthopnea, PND, swelling in lower extremities, anasarca, dizziness, palpitations Resp: No-   shortness of breath with exertion or at rest.             +  productive cough,  No non-productive cough,  No- coughing up of blood.              +   change in color of mucus.  No- wheezing.   Skin: No-   rash or lesions. GI:  No-   heartburn, indigestion, abdominal pain, nausea, vomiting, diarrhea,                 change in bowel  habits, loss of appetite GU:  MS:  No-   joint pain or swelling.  No- decreased range of motion.  No- back pain. Neuro-     nothing unusual Psych:  No- change in mood or affect. No depression or anxiety.  No memory loss.      Objective:   Physical Exam General- Alert, Oriented, Affect-appropriate, Distress- none acute   Tall, slender, relaxed Skin- , excoriation- none + 1- 2 cm diam raised lesion dorsum right hand, raised margin, central scale,  Lymphadenopathy- none Head- atraumatic            Eyes- Gross vision intact, PERRLA, conjunctivae clear secretions            Ears- Hearing, canals normal            Nose- Clear, Septal dev, mucus, polyps, erosion, perforation             Throat- Mallampati II , mucosa clear , drainage- none, tonsils- atrophic. Teeth crowned. Hoarse Neck- flexible , trachea midline, no stridor , thyroid nl, carotid no bruit Chest - symmetrical excursion , unlabored           Heart/CV- RRR , no murmur , no gallop  , no rub, nl  s1 s2                           - JVD- none , edema- none, stasis changes- none, varices- none           Lung- bilateral rhonchi, wheeze- none, cough- none , dullness-none, rub- none           Chest wall-  Abd- Br/ Gen/ Rectal- Not done, not indicated Extrem- cyanosis- none, clubbing, none, atrophy- none, strength- nl Neuro- grossly intact to observation

## 2011-06-27 ENCOUNTER — Encounter: Payer: Self-pay | Admitting: Internal Medicine

## 2011-06-27 NOTE — Assessment & Plan Note (Signed)
He is satisfied that allergy vaccine is helpful to him. We will be watching as the next pollen season moves in.

## 2011-06-27 NOTE — Assessment & Plan Note (Signed)
Recent bronchitic exacerbation related to a viral upper respiratory infection/acute bronchitis. Plan-extra fluids, Z-Pak.

## 2011-07-02 ENCOUNTER — Ambulatory Visit (INDEPENDENT_AMBULATORY_CARE_PROVIDER_SITE_OTHER): Payer: BC Managed Care – PPO

## 2011-07-02 DIAGNOSIS — J309 Allergic rhinitis, unspecified: Secondary | ICD-10-CM

## 2011-07-08 ENCOUNTER — Ambulatory Visit (INDEPENDENT_AMBULATORY_CARE_PROVIDER_SITE_OTHER): Payer: BC Managed Care – PPO

## 2011-07-08 DIAGNOSIS — J309 Allergic rhinitis, unspecified: Secondary | ICD-10-CM

## 2011-07-10 ENCOUNTER — Encounter: Payer: Self-pay | Admitting: Internal Medicine

## 2011-07-11 ENCOUNTER — Ambulatory Visit (INDEPENDENT_AMBULATORY_CARE_PROVIDER_SITE_OTHER): Payer: BC Managed Care – PPO | Admitting: Pulmonary Disease

## 2011-07-11 ENCOUNTER — Encounter: Payer: Self-pay | Admitting: Pulmonary Disease

## 2011-07-11 VITALS — BP 102/64 | HR 58 | Temp 97.6°F | Ht 74.0 in | Wt 167.0 lb

## 2011-07-11 DIAGNOSIS — J45909 Unspecified asthma, uncomplicated: Secondary | ICD-10-CM

## 2011-07-11 MED ORDER — FORMOTEROL FUMARATE 12 MCG IN CAPS: 12.0000 ug | ORAL_CAPSULE | Freq: Two times a day (BID) | RESPIRATORY_TRACT | Status: DC

## 2011-07-11 MED ORDER — BECLOMETHASONE DIPROPIONATE 80 MCG/ACT IN AERS: 2.0000 | INHALATION_SPRAY | Freq: Two times a day (BID) | RESPIRATORY_TRACT | Status: DC

## 2011-07-11 MED ORDER — PREDNISONE 5 MG PO TABS: ORAL_TABLET | ORAL | Status: DC

## 2011-07-11 NOTE — Assessment & Plan Note (Signed)
The patient is doing fairly well from an asthma standpoint, although he is getting over a recent episode of acute asthmatic bronchitis.  He is just getting back to baseline, and I have asked him to call us if he does not get back to his usual self.  He is to continue on his current maintenance medications, as well as his allergy vaccine.

## 2011-07-11 NOTE — Progress Notes (Signed)
  Subjective:    Patient ID: Levi [name], male    DOB: [birth year], 66 y.o.   MRN: [id number]  HPI The patient comes in today for followup of his known asthma.  He has been doing fairly well overall, but did have some type of an acute viral illness or acute bronchitis last month.  He responded well to a course of Z-Pak and a Medrol injection, and did not have to pulse his prednisone.  He feels that he is almost back to baseline, but still feels slight airflow limitation.  He is not having any significant cough or mucus production.   Review of Systems  Constitutional: Positive for fever. Negative for unexpected weight change.  HENT: Positive for congestion, sore throat and postnasal drip. Negative for ear pain, nosebleeds, rhinorrhea, sneezing, trouble swallowing, dental problem and sinus pressure.   Eyes: Positive for redness and itching.  Respiratory: Positive for cough, chest tightness, shortness of breath and wheezing.   Cardiovascular: Negative for palpitations and leg swelling.  Gastrointestinal: Negative for nausea, vomiting and diarrhea.  Genitourinary: Negative for dysuria.  Musculoskeletal: Negative for joint swelling.  Skin: Negative for rash.  Neurological: Negative for headaches.  Hematological: Bruises/bleeds easily.  Psychiatric/Behavioral: Negative for dysphoric mood. The patient is not nervous/anxious.        Objective:   Physical Exam Thin male in no acute distress Nose without purulent discharge noted Chest fairly clear auscultation, no wheezing Cardiac exam is regular rate and rhythm Lower extremities no edema, no cyanosis Alert and oriented, moves all 4 extremities.       Assessment & Plan:

## 2011-07-11 NOTE — Patient Instructions (Signed)
No change in meds.  Will get you refills. Lets see how you do over the next 1-2 weeks.  If you do not get back to baseline, or if you get worse, please call us. If doing well, followup with me in 4mos.

## 2011-07-12 ENCOUNTER — Encounter: Payer: Self-pay | Admitting: Pulmonary Disease

## 2011-07-16 ENCOUNTER — Ambulatory Visit (INDEPENDENT_AMBULATORY_CARE_PROVIDER_SITE_OTHER): Payer: BC Managed Care – PPO

## 2011-07-16 DIAGNOSIS — J309 Allergic rhinitis, unspecified: Secondary | ICD-10-CM

## 2011-07-22 ENCOUNTER — Ambulatory Visit (INDEPENDENT_AMBULATORY_CARE_PROVIDER_SITE_OTHER): Payer: BC Managed Care – PPO

## 2011-07-22 DIAGNOSIS — J309 Allergic rhinitis, unspecified: Secondary | ICD-10-CM

## 2011-07-29 ENCOUNTER — Ambulatory Visit (INDEPENDENT_AMBULATORY_CARE_PROVIDER_SITE_OTHER): Payer: BC Managed Care – PPO

## 2011-07-29 DIAGNOSIS — J309 Allergic rhinitis, unspecified: Secondary | ICD-10-CM

## 2011-08-05 ENCOUNTER — Ambulatory Visit (INDEPENDENT_AMBULATORY_CARE_PROVIDER_SITE_OTHER): Payer: BC Managed Care – PPO

## 2011-08-05 DIAGNOSIS — J309 Allergic rhinitis, unspecified: Secondary | ICD-10-CM

## 2011-08-06 ENCOUNTER — Ambulatory Visit (INDEPENDENT_AMBULATORY_CARE_PROVIDER_SITE_OTHER): Payer: BC Managed Care – PPO

## 2011-08-06 DIAGNOSIS — J309 Allergic rhinitis, unspecified: Secondary | ICD-10-CM

## 2011-08-12 ENCOUNTER — Ambulatory Visit (INDEPENDENT_AMBULATORY_CARE_PROVIDER_SITE_OTHER): Payer: BC Managed Care – PPO

## 2011-08-12 DIAGNOSIS — J309 Allergic rhinitis, unspecified: Secondary | ICD-10-CM

## 2011-08-19 ENCOUNTER — Ambulatory Visit (INDEPENDENT_AMBULATORY_CARE_PROVIDER_SITE_OTHER): Payer: BC Managed Care – PPO

## 2011-08-19 DIAGNOSIS — J309 Allergic rhinitis, unspecified: Secondary | ICD-10-CM

## 2011-08-26 ENCOUNTER — Ambulatory Visit (INDEPENDENT_AMBULATORY_CARE_PROVIDER_SITE_OTHER): Payer: BC Managed Care – PPO

## 2011-08-26 DIAGNOSIS — J309 Allergic rhinitis, unspecified: Secondary | ICD-10-CM

## 2011-09-02 ENCOUNTER — Ambulatory Visit (INDEPENDENT_AMBULATORY_CARE_PROVIDER_SITE_OTHER): Payer: BC Managed Care – PPO

## 2011-09-02 DIAGNOSIS — J309 Allergic rhinitis, unspecified: Secondary | ICD-10-CM

## 2011-09-09 ENCOUNTER — Encounter: Payer: Self-pay | Admitting: Internal Medicine

## 2011-09-09 ENCOUNTER — Ambulatory Visit (INDEPENDENT_AMBULATORY_CARE_PROVIDER_SITE_OTHER): Payer: BC Managed Care – PPO

## 2011-09-09 DIAGNOSIS — J309 Allergic rhinitis, unspecified: Secondary | ICD-10-CM

## 2011-09-16 ENCOUNTER — Ambulatory Visit (INDEPENDENT_AMBULATORY_CARE_PROVIDER_SITE_OTHER): Payer: BC Managed Care – PPO

## 2011-09-16 DIAGNOSIS — J309 Allergic rhinitis, unspecified: Secondary | ICD-10-CM

## 2011-09-24 ENCOUNTER — Ambulatory Visit (INDEPENDENT_AMBULATORY_CARE_PROVIDER_SITE_OTHER): Payer: BC Managed Care – PPO

## 2011-09-24 DIAGNOSIS — J309 Allergic rhinitis, unspecified: Secondary | ICD-10-CM

## 2011-10-01 ENCOUNTER — Ambulatory Visit (INDEPENDENT_AMBULATORY_CARE_PROVIDER_SITE_OTHER): Payer: BC Managed Care – PPO

## 2011-10-01 DIAGNOSIS — J309 Allergic rhinitis, unspecified: Secondary | ICD-10-CM

## 2011-10-08 ENCOUNTER — Ambulatory Visit (INDEPENDENT_AMBULATORY_CARE_PROVIDER_SITE_OTHER): Payer: BC Managed Care – PPO

## 2011-10-08 DIAGNOSIS — J309 Allergic rhinitis, unspecified: Secondary | ICD-10-CM

## 2011-10-15 ENCOUNTER — Ambulatory Visit (INDEPENDENT_AMBULATORY_CARE_PROVIDER_SITE_OTHER): Payer: BC Managed Care – PPO

## 2011-10-15 DIAGNOSIS — J309 Allergic rhinitis, unspecified: Secondary | ICD-10-CM

## 2011-10-22 ENCOUNTER — Ambulatory Visit (INDEPENDENT_AMBULATORY_CARE_PROVIDER_SITE_OTHER): Payer: BC Managed Care – PPO

## 2011-10-22 DIAGNOSIS — J309 Allergic rhinitis, unspecified: Secondary | ICD-10-CM

## 2011-10-28 ENCOUNTER — Ambulatory Visit (INDEPENDENT_AMBULATORY_CARE_PROVIDER_SITE_OTHER): Payer: BC Managed Care – PPO

## 2011-10-28 DIAGNOSIS — J309 Allergic rhinitis, unspecified: Secondary | ICD-10-CM

## 2011-11-04 ENCOUNTER — Ambulatory Visit (INDEPENDENT_AMBULATORY_CARE_PROVIDER_SITE_OTHER): Payer: BC Managed Care – PPO

## 2011-11-04 DIAGNOSIS — J309 Allergic rhinitis, unspecified: Secondary | ICD-10-CM

## 2011-11-07 ENCOUNTER — Ambulatory Visit (INDEPENDENT_AMBULATORY_CARE_PROVIDER_SITE_OTHER): Payer: BC Managed Care – PPO | Admitting: Pulmonary Disease

## 2011-11-07 ENCOUNTER — Encounter: Payer: Self-pay | Admitting: Pulmonary Disease

## 2011-11-07 VITALS — BP 98/68 | HR 63 | Temp 97.5°F | Ht 74.0 in | Wt 153.8 lb

## 2011-11-07 DIAGNOSIS — J45909 Unspecified asthma, uncomplicated: Secondary | ICD-10-CM

## 2011-11-07 MED ORDER — BECLOMETHASONE DIPROPIONATE 80 MCG/ACT IN AERS: 2.0000 | INHALATION_SPRAY | Freq: Two times a day (BID) | RESPIRATORY_TRACT | Status: DC

## 2011-11-07 MED ORDER — FORMOTEROL FUMARATE 12 MCG IN CAPS: 12.0000 ug | ORAL_CAPSULE | Freq: Two times a day (BID) | RESPIRATORY_TRACT | Status: DC

## 2011-11-07 MED ORDER — PREDNISONE 5 MG PO TABS: ORAL_TABLET | ORAL | Status: DC

## 2011-11-07 MED ORDER — PANTOPRAZOLE SODIUM 40 MG PO TBEC: 40.0000 mg | DELAYED_RELEASE_TABLET | Freq: Two times a day (BID) | ORAL | Status: DC

## 2011-11-07 MED ORDER — PIRBUTEROL ACETATE 200 MCG/INH IN AERB: 2.0000 | INHALATION_SPRAY | Freq: Four times a day (QID) | RESPIRATORY_TRACT | Status: DC

## 2011-11-07 NOTE — Progress Notes (Signed)
  Subjective:    Patient ID: Levi [name], male    DOB: [birth year], 66 y.o.   MRN: [id number]  HPI The patient comes in today for followup of his known severe asthma with allergic component and possibly ABPA.  He is on an aggressive bronchodilator regimen, her allergy vaccine, and also chronic prednisone between 5 and 10 mg a day.  We have tried to get him on Xolair, but the insurance has refused despite very good documentation.  Since last visit, the patient feels that he has done fairly well.  He is having a lot more allergy symptoms and has had variability in his breathing because of the weather.  He increased his prednisone from 5-10 mg a day because of this, but feels that he is doing well at his current level.  He will decrease back to 5 mg once allergy season is over.  He has not had an acute exacerbation or pulmonary infection since last visit.  He feels that his breathing is at baseline.   Review of Systems  Constitutional: Negative.  Negative for fever and unexpected weight change.  HENT: Positive for rhinorrhea and postnasal drip. Negative for ear pain, nosebleeds, congestion, sore throat, sneezing, trouble swallowing, dental problem and sinus pressure.   Eyes: Negative.  Negative for redness and itching.  Respiratory: Positive for cough, shortness of breath and wheezing. Negative for chest tightness.   Cardiovascular: Negative.  Negative for palpitations and leg swelling.  Gastrointestinal: Negative.  Negative for nausea and vomiting.  Genitourinary: Negative.  Negative for dysuria.  Musculoskeletal: Negative.  Negative for joint swelling.  Skin: Negative for rash.  Neurological: Negative.  Negative for headaches.  Hematological: Negative.  Does not bruise/bleed easily.  Psychiatric/Behavioral: Negative.  Negative for dysphoric mood. The patient is not nervous/anxious.        Objective:   Physical Exam Thin male in no acute distress Nose without purulence or discharge noted Chest  totally clear to auscultation, no wheezing Cardiac exam with regular rate and rhythm Lower extremities without edema, cyanosis Alert and oriented, moves all 4 extremities.       Assessment & Plan:

## 2011-11-07 NOTE — Patient Instructions (Addendum)
No change in current medications, continue on allergy vaccine. Will send in new prescription for your meds. If doing well, followup with me in 4mos.

## 2011-11-07 NOTE — Addendum Note (Signed)
Addended by: Michel Bickers A on: 11/07/2011 10:13 AM   Modules accepted: Orders

## 2011-11-07 NOTE — Assessment & Plan Note (Signed)
The patient is doing fairly well on his current medication regimen.  He has had more allergies and some increased shortness of breath, but this improved with increasing his prednisone to 10 mg a day.  I have asked him to try and get this back to 5 mg a day once allergy season is over.  I have asked him to stay on his current medical regimen including allergy vaccine, and to followup with me in 4 months.

## 2011-11-11 ENCOUNTER — Ambulatory Visit (INDEPENDENT_AMBULATORY_CARE_PROVIDER_SITE_OTHER): Payer: BC Managed Care – PPO

## 2011-11-11 DIAGNOSIS — J309 Allergic rhinitis, unspecified: Secondary | ICD-10-CM

## 2011-11-18 ENCOUNTER — Ambulatory Visit (INDEPENDENT_AMBULATORY_CARE_PROVIDER_SITE_OTHER): Payer: BC Managed Care – PPO

## 2011-11-18 DIAGNOSIS — J309 Allergic rhinitis, unspecified: Secondary | ICD-10-CM

## 2011-11-25 ENCOUNTER — Ambulatory Visit (INDEPENDENT_AMBULATORY_CARE_PROVIDER_SITE_OTHER): Payer: BC Managed Care – PPO

## 2011-11-25 DIAGNOSIS — J309 Allergic rhinitis, unspecified: Secondary | ICD-10-CM

## 2011-12-03 ENCOUNTER — Ambulatory Visit (INDEPENDENT_AMBULATORY_CARE_PROVIDER_SITE_OTHER): Payer: BC Managed Care – PPO

## 2011-12-03 DIAGNOSIS — J309 Allergic rhinitis, unspecified: Secondary | ICD-10-CM

## 2011-12-09 ENCOUNTER — Ambulatory Visit (INDEPENDENT_AMBULATORY_CARE_PROVIDER_SITE_OTHER): Payer: BC Managed Care – PPO

## 2011-12-09 DIAGNOSIS — J309 Allergic rhinitis, unspecified: Secondary | ICD-10-CM

## 2011-12-10 ENCOUNTER — Encounter: Payer: Self-pay | Admitting: Internal Medicine

## 2011-12-12 ENCOUNTER — Other Ambulatory Visit: Payer: Self-pay | Admitting: Orthopedic Surgery

## 2011-12-12 ENCOUNTER — Ambulatory Visit (INDEPENDENT_AMBULATORY_CARE_PROVIDER_SITE_OTHER): Payer: BC Managed Care – PPO

## 2011-12-12 DIAGNOSIS — J309 Allergic rhinitis, unspecified: Secondary | ICD-10-CM

## 2011-12-13 ENCOUNTER — Ambulatory Visit: Payer: BC Managed Care – PPO

## 2011-12-17 ENCOUNTER — Ambulatory Visit (INDEPENDENT_AMBULATORY_CARE_PROVIDER_SITE_OTHER): Payer: BC Managed Care – PPO

## 2011-12-17 DIAGNOSIS — J309 Allergic rhinitis, unspecified: Secondary | ICD-10-CM

## 2011-12-25 ENCOUNTER — Ambulatory Visit: Payer: BC Managed Care – PPO | Admitting: Internal Medicine

## 2011-12-26 ENCOUNTER — Encounter (HOSPITAL_COMMUNITY): Payer: Self-pay | Admitting: Pharmacy Technician

## 2011-12-27 ENCOUNTER — Ambulatory Visit (INDEPENDENT_AMBULATORY_CARE_PROVIDER_SITE_OTHER): Payer: BC Managed Care – PPO | Admitting: Internal Medicine

## 2011-12-27 ENCOUNTER — Encounter: Payer: Self-pay | Admitting: Internal Medicine

## 2011-12-27 ENCOUNTER — Ambulatory Visit (INDEPENDENT_AMBULATORY_CARE_PROVIDER_SITE_OTHER): Payer: BC Managed Care – PPO

## 2011-12-27 VITALS — BP 100/60 | HR 99 | Ht 74.0 in | Wt 150.6 lb

## 2011-12-27 DIAGNOSIS — J45909 Unspecified asthma, uncomplicated: Secondary | ICD-10-CM

## 2011-12-27 DIAGNOSIS — J309 Allergic rhinitis, unspecified: Secondary | ICD-10-CM

## 2011-12-27 DIAGNOSIS — J301 Allergic rhinitis due to pollen: Secondary | ICD-10-CM

## 2011-12-27 MED ORDER — AZELASTINE-FLUTICASONE 137-50 MCG/ACT NA SUSP: 2.0000 | Freq: Every day | NASAL | Status: DC

## 2011-12-27 NOTE — Progress Notes (Signed)
Patient ID: Levi [name], male    DOB: March 28, 1946, 66 y.o.   MRN: [id number]  HPI 12/24/10- 28 yoM never smoker followed for allergy/ asthma, rhinitis by Dr Maple Hudson, for asthma/ hx ABPA by Dr Shelle Iron Last here June 25, 2010 Last visit he had a rash on his hand. That resolved with topical steroid, but has recently recurred in the same area on dorsum right hand. Allergy vaccine- 1:10 GH Had more asthma at end of April- saw Dr Shelle Iron. Maintenance prednisone was briefly increased, now back to 5 mg daily. Has needed rescue inhaler only 2-3x since then.  Occasional stuffy nose- uses saline nasal rinse.  CXR Feb, 2012- clear, NAD.   06/26/11- 64 yoM never smoker followed for allergy/ asthma, rhinitis by Dr Maple Hudson, for asthma/ hx ABPA by Dr Shelle Iron He was itching and sneezing a few days ago but that resolved. He had a temperature up to 100.3 with sweating at night, light green sputum, raspy crackle in his chest. He felt that he had a cold starting to get better. Denies sore throat sinus pain or chest pain. Continues allergy vaccine without problems and is satisfied that it is helpful. Continues prednisone maintenance 5 mg daily. Has not had significant exacerbation of wheezing with his recent acute illness.  12/27/11-  64 yoM never smoker followed for allergy/ asthma, rhinitis by Dr Maple Hudson, for asthma/ hx ABPA by Dr Shelle Iron Doing well on vaccine and denies any trouble/flare ups at this time Continue vaccine 1:10 GH. Spring was difficult season, with some tightness, but doing well now.  Always some postnasal drip and nose "floods" at times. Rare headache.  ROS-see HPI Constitutional:   No-   weight loss, night sweats, fevers, chills, fatigue, lassitude. HEENT:   +rare headaches, difficulty swallowing, tooth/dental problems, sore throat,       +  sneezing, itching, ear ache, nasal congestion, post nasal drip,  CV:  No-   chest pain, orthopnea, PND, swelling in lower extremities, anasarca, dizziness,  palpitations Resp: No-   shortness of breath with exertion or at rest.             No-  productive cough,  No non-productive cough,  No- coughing up of blood.              No- change in color of mucus.  No- wheezing.   Skin: No-   rash or lesions. GI:  No-   heartburn, indigestion, abdominal pain, nausea, vomiting, GU:  MS:  No-   joint pain or swelling.   Neuro-     nothing unusual Psych:  No- change in mood or affect. No depression or anxiety.  No memory loss.      Objective:   Physical Exam General- Alert, Oriented, Affect-appropriate, Distress- none acute   Tall, slender, relaxed Skin- , excoriation- none  Lymphadenopathy- none Head- atraumatic            Eyes- Gross vision intact, PERRLA, conjunctivae clear secretions            Ears- Hearing, canals normal            Nose- Mucus L side, Septal dev,  polyps, erosion,   ? perforation             Throat- Mallampati II , mucosa clear , drainage- none, tonsils- atrophic. Teeth crowned.  Neck- flexible , trachea midline, no stridor , thyroid nl, carotid no bruit Chest - symmetrical excursion , unlabored  Heart/CV- RRR , no murmur , no gallop  , no rub, nl s1 s2                           - JVD- none , edema- none, stasis changes- none, varices- none           Lung- quiet chest, wheeze- none, cough- none , dullness-none, rub- none           Chest wall-  Abd- Br/ Gen/ Rectal- Not done, not indicated Extrem- cyanosis- none, clubbing, none, atrophy- none, strength- nl Neuro- grossly intact to observation

## 2011-12-27 NOTE — Assessment & Plan Note (Signed)
Currently good control 

## 2011-12-27 NOTE — Assessment & Plan Note (Signed)
He feels allergy vaccine continues to help,  But has some drainage. Plan- Continue vaccine, add sample Dymista nasal spry

## 2011-12-27 NOTE — Patient Instructions (Addendum)
Continue allergy vaccine  Sample and script to try Dymista nasal spray   1-2 puffs each nostril once daily at bedtime. If you like the sample, you can fill the script. This would replace flonase.  Please call as needed

## 2012-01-03 ENCOUNTER — Ambulatory Visit (INDEPENDENT_AMBULATORY_CARE_PROVIDER_SITE_OTHER): Payer: BC Managed Care – PPO

## 2012-01-03 DIAGNOSIS — J309 Allergic rhinitis, unspecified: Secondary | ICD-10-CM

## 2012-01-06 ENCOUNTER — Telehealth: Payer: Self-pay | Admitting: Pulmonary Disease

## 2012-01-06 NOTE — Telephone Encounter (Signed)
Found the PA in triage > called the number provided on the fax from the local pharmacy: [phone].  Called [employer] [place], spoke with [place] and did the coverage review over the phone >> dx of intrinsic asthma, pt uses protonix twice daily as once daily was not effective.  PA approved from 8.3.13 thru 8.3.14, case # [id number].  Confirmation fax to be sent to the triage fax machine.  Called spoke with patient, he is aware.  Will sign off.

## 2012-01-07 ENCOUNTER — Encounter (HOSPITAL_COMMUNITY): Payer: Self-pay

## 2012-01-07 ENCOUNTER — Encounter (HOSPITAL_COMMUNITY)
Admission: RE | Admit: 2012-01-07 | Discharge: 2012-01-07 | Disposition: A | Discharge status: Home / Self Care | Payer: BC Managed Care – PPO | Source: Ambulatory Visit | Attending: Orthopedic Surgery | Admitting: Orthopedic Surgery

## 2012-01-07 ENCOUNTER — Telehealth: Payer: Self-pay | Admitting: Pulmonary Disease

## 2012-01-07 HISTORY — DX: Gastro-esophageal reflux disease without esophagitis: K21.9

## 2012-01-07 HISTORY — DX: Chronic obstructive pulmonary disease, unspecified: J44.9

## 2012-01-07 LAB — DIFFERENTIAL
Basophils Absolute: 0 10*3/uL (ref 0.0–0.1)
Basophils Relative: 0 % (ref 0–1)
Eosinophils Absolute: 0 10*3/uL (ref 0.0–0.7)
Eosinophils Relative: 0 % (ref 0–5)
Lymphocytes Relative: 15 % (ref 12–46)
Lymphs Abs: 1.4 10*3/uL (ref 0.7–4.0)
Monocytes Absolute: 0.7 10*3/uL (ref 0.1–1.0)
Monocytes Relative: 8 % (ref 3–12)
Neutro Abs: 7 10*3/uL (ref 1.7–7.7)
Neutrophils Relative %: 77 % (ref 43–77)

## 2012-01-07 LAB — PROTIME-INR
INR: 0.98 (ref 0.00–1.49)
Prothrombin Time: 13.2 seconds (ref 11.6–15.2)

## 2012-01-07 LAB — URINALYSIS, ROUTINE W REFLEX MICROSCOPIC
Bilirubin Urine: NEGATIVE
Glucose, UA: NEGATIVE mg/dL
Hgb urine dipstick: NEGATIVE
Ketones, ur: NEGATIVE mg/dL
Leukocytes, UA: NEGATIVE
Nitrite: NEGATIVE
Protein, ur: NEGATIVE mg/dL
Specific Gravity, Urine: 1.023 (ref 1.005–1.030)
Urobilinogen, UA: 0.2 mg/dL (ref 0.0–1.0)
pH: 7.5 (ref 5.0–8.0)

## 2012-01-07 LAB — SURGICAL PCR SCREEN
MRSA, PCR: NEGATIVE
Staphylococcus aureus: POSITIVE — AB

## 2012-01-07 LAB — CBC
HCT: 41.8 % (ref 39.0–52.0)
Hemoglobin: 14.3 g/dL (ref 13.0–17.0)
MCH: 32.8 pg (ref 26.0–34.0)
MCHC: 34.2 g/dL (ref 30.0–36.0)
MCV: 95.9 fL (ref 78.0–100.0)
Platelets: 189 10*3/uL (ref 150–400)
RBC: 4.36 MIL/uL (ref 4.22–5.81)
RDW: 13.7 % (ref 11.5–15.5)
WBC: 9.1 10*3/uL (ref 4.0–10.5)

## 2012-01-07 LAB — APTT: aPTT: 28 seconds (ref 24–37)

## 2012-01-07 LAB — COMPREHENSIVE METABOLIC PANEL
ALT: 17 U/L (ref 0–53)
AST: 25 U/L (ref 0–37)
Albumin: 4.2 g/dL (ref 3.5–5.2)
Alkaline Phosphatase: 60 U/L (ref 39–117)
BUN: 21 mg/dL (ref 6–23)
CO2: 28 mEq/L (ref 19–32)
Calcium: 9.7 mg/dL (ref 8.4–10.5)
Chloride: 100 mEq/L (ref 96–112)
Creatinine, Ser: 0.79 mg/dL (ref 0.50–1.35)
GFR calc Af Amer: >90 mL/min (ref >90)
GFR calc non Af Amer: >90 mL/min (ref >90)
Glucose, Bld: 102 mg/dL — ABNORMAL HIGH (ref 70–99)
Potassium: 4 mEq/L (ref 3.5–5.1)
Sodium: 139 mEq/L (ref 135–145)
Total Bilirubin: 0.7 mg/dL (ref 0.3–1.2)
Total Protein: 7 g/dL (ref 6.0–8.3)

## 2012-01-07 LAB — TYPE AND SCREEN
ABO/RH(D): O POS
Antibody Screen: NEGATIVE

## 2012-01-07 NOTE — Telephone Encounter (Signed)
Lawson Fiscal, do you have a surgical clearance form on this pt. Please advise thanks

## 2012-01-07 NOTE — Progress Notes (Signed)
Requested OV, cath report,stress test, ECHO EKG from Dr. Chales Abrahams office([facility])

## 2012-01-07 NOTE — Pre-Procedure Instructions (Signed)
20 Eugune Sine  01/07/2012   Your procedure is scheduled on:  01-10-2012  Report to [facility] at 5:30 AM.  Call this number if you have problems the morning of surgery: [phone]   Remember:   Do not eat food or drink:After Midnight. .    Take these medicines the morning of surgery with A SIP OF WATER: allopurinol,inhalers as needed and instructed by MD,Isororbide(Imdur),pantoprazole(Protonix)   Do not wear jewelry, make-up or nail polish.  Do not wear lotions, powders, or perfumes. You may wear deodorant.  Do not shave 48 hours prior to surgery. Men may shave face and neck.  Do not bring valuables to the hospital.  Contacts, dentures or bridgework may not be worn into surgery.  Leave suitcase in the car. After surgery it may be brought to your room.  For patients admitted to the hospital, checkout time is 11:00 AM the day of discharge.       Special Instructions: Incentive Spirometry - Practice and bring it with you on the day of surgery. and CHG Shower Use Special Wash: 1/2 bottle night before surgery and 1/2 bottle morning of surgery.   Please read over the following fact sheets that you were given: Pain Booklet, Coughing and Deep Breathing, Blood Transfusion Information, MRSA Information, Surgical Site Infection Prevention and Anesthesia Post-op Instructions\]

## 2012-01-08 ENCOUNTER — Other Ambulatory Visit: Payer: Self-pay | Admitting: Pulmonary Disease

## 2012-01-08 NOTE — Telephone Encounter (Signed)
No.  Will need to send form AGAIN.  This will be the last.

## 2012-01-08 NOTE — Telephone Encounter (Signed)
Per KC, this form was filled out and faxed back some time ago but do not see where this has been scanned into the chart yet. They may need to refax this if they did not received it.

## 2012-01-08 NOTE — Telephone Encounter (Signed)
Dr. Shelle Iron, I see your note on the fax stating this was done multiple occasions.  There is nothing scanned into epic regarding this.  Darl Pikes states she will take a VO for surgery.  Can we give a VO for surgery clearance?  Please advise.  Thank you.

## 2012-01-08 NOTE — Progress Notes (Signed)
Notes from Ollie Cards... Dr Anne Fu..On chart.

## 2012-01-08 NOTE — Telephone Encounter (Signed)
I spoke w/ Darl Pikes regarding the surgical clearance.  Stated that they did not receive this from [facility] yet. Stated that a verbal order regarding the surgery will be ok.  Antionette Fairy

## 2012-01-08 NOTE — Telephone Encounter (Signed)
No form in pt chart--LM for susan to have her refax this over to [place]

## 2012-01-08 NOTE — Telephone Encounter (Signed)
I spoke with Levi [name] and advised her to fax surgical clearance form back over. She will fax to triage fax # and once received will give to Dr. Shelle Iron to fill out. Will hold in triage until fax come through

## 2012-01-09 ENCOUNTER — Ambulatory Visit (INDEPENDENT_AMBULATORY_CARE_PROVIDER_SITE_OTHER): Payer: BC Managed Care – PPO

## 2012-01-09 DIAGNOSIS — J309 Allergic rhinitis, unspecified: Secondary | ICD-10-CM

## 2012-01-09 MED ORDER — CEFAZOLIN SODIUM-DEXTROSE 2-3 GM-% IV SOLR
2.0000 g | INTRAVENOUS | Status: AC
  Administered 2012-01-10: 2 g via INTRAVENOUS
  Filled 2012-01-09: qty 50

## 2012-01-09 NOTE — Telephone Encounter (Signed)
Surgical clearance has been faxed and I spoke with Darl Pikes to make sure she had received it this time. Darl Pikes has the fax and nothing further is needed.

## 2012-01-10 ENCOUNTER — Encounter (HOSPITAL_COMMUNITY): Payer: Self-pay | Admitting: Anesthesiology

## 2012-01-10 ENCOUNTER — Encounter (HOSPITAL_COMMUNITY)
Admission: RE | Disposition: A | Discharge status: Home Health | Payer: Self-pay | Source: Ambulatory Visit | Attending: Orthopedic Surgery

## 2012-01-10 ENCOUNTER — Inpatient Hospital Stay (HOSPITAL_COMMUNITY)
Admission: RE | Admit: 2012-01-10 | Discharge: 2012-01-12 | DRG: 209 | Disposition: A | Discharge status: Home Health | Payer: BC Managed Care – PPO | Source: Ambulatory Visit | Attending: Orthopedic Surgery | Admitting: Orthopedic Surgery

## 2012-01-10 ENCOUNTER — Ambulatory Visit (HOSPITAL_COMMUNITY): Payer: BC Managed Care – PPO | Admitting: Anesthesiology

## 2012-01-10 DIAGNOSIS — J449 Chronic obstructive pulmonary disease, unspecified: Secondary | ICD-10-CM | POA: Diagnosis present

## 2012-01-10 DIAGNOSIS — M1712 Unilateral primary osteoarthritis, left knee: Secondary | ICD-10-CM | POA: Diagnosis present

## 2012-01-10 DIAGNOSIS — Z79899 Other long term (current) drug therapy: Secondary | ICD-10-CM

## 2012-01-10 DIAGNOSIS — Z01811 Encounter for preprocedural respiratory examination: Secondary | ICD-10-CM

## 2012-01-10 DIAGNOSIS — J45909 Unspecified asthma, uncomplicated: Secondary | ICD-10-CM

## 2012-01-10 DIAGNOSIS — J4489 Other specified chronic obstructive pulmonary disease: Secondary | ICD-10-CM | POA: Diagnosis present

## 2012-01-10 DIAGNOSIS — M1710 Unilateral primary osteoarthritis, unspecified knee: Principal | ICD-10-CM | POA: Diagnosis present

## 2012-01-10 DIAGNOSIS — Z01812 Encounter for preprocedural laboratory examination: Secondary | ICD-10-CM

## 2012-01-10 DIAGNOSIS — IMO0002 Reserved for concepts with insufficient information to code with codable children: Principal | ICD-10-CM

## 2012-01-10 DIAGNOSIS — K219 Gastro-esophageal reflux disease without esophagitis: Secondary | ICD-10-CM | POA: Diagnosis present

## 2012-01-10 DIAGNOSIS — J301 Allergic rhinitis due to pollen: Secondary | ICD-10-CM

## 2012-01-10 HISTORY — PX: TOTAL KNEE ARTHROPLASTY: SHX125

## 2012-01-10 HISTORY — DX: Unilateral primary osteoarthritis, left knee: M17.12

## 2012-01-10 SURGERY — ARTHROPLASTY, KNEE, TOTAL
Anesthesia: General | Site: Knee | Wound class: Clean

## 2012-01-10 MED ORDER — PROPOFOL 10 MG/ML IV EMUL
INTRAVENOUS | Status: DC | PRN
  Administered 2012-01-10: 200 mg via INTRAVENOUS
  Administered 2012-01-10: 20 mg via INTRAVENOUS
  Administered 2012-01-10: 30 mg via INTRAVENOUS
  Administered 2012-01-10: 20 mg via INTRAVENOUS
  Administered 2012-01-10: 10 mg via INTRAVENOUS
  Administered 2012-01-10: 20 mg via INTRAVENOUS
  Administered 2012-01-10: 100 mg via INTRAVENOUS

## 2012-01-10 MED ORDER — METHOCARBAMOL 500 MG PO TABS
500.0000 mg | ORAL_TABLET | Freq: Four times a day (QID) | ORAL | Status: DC | PRN
  Administered 2012-01-10 – 2012-01-11 (×3): 500 mg via ORAL
  Filled 2012-01-10 (×2): qty 1

## 2012-01-10 MED ORDER — OXYCODONE HCL 5 MG PO TABS
5.0000 mg | ORAL_TABLET | ORAL | Status: DC | PRN
  Administered 2012-01-10: 5 mg via ORAL
  Administered 2012-01-10 – 2012-01-12 (×6): 10 mg via ORAL
  Filled 2012-01-10 (×6): qty 2

## 2012-01-10 MED ORDER — DEXTROSE 5 % IV SOLN
INTRAVENOUS | Status: DC | PRN
  Administered 2012-01-10: 08:00:00 via INTRAVENOUS

## 2012-01-10 MED ORDER — ACETAMINOPHEN 10 MG/ML IV SOLN
INTRAVENOUS | Status: AC
Start: 2012-01-10 — End: 2012-01-10
  Filled 2012-01-10: qty 100

## 2012-01-10 MED ORDER — DOCUSATE SODIUM 100 MG PO CAPS
100.0000 mg | ORAL_CAPSULE | Freq: Two times a day (BID) | ORAL | Status: DC
  Administered 2012-01-10 – 2012-01-12 (×5): 100 mg via ORAL
  Filled 2012-01-10 (×6): qty 1

## 2012-01-10 MED ORDER — PREDNISONE 10 MG PO TABS
10.0000 mg | ORAL_TABLET | Freq: Every day | ORAL | Status: DC
  Administered 2012-01-11 – 2012-01-12 (×2): 10 mg via ORAL
  Filled 2012-01-10 (×4): qty 1

## 2012-01-10 MED ORDER — ONDANSETRON HCL 4 MG/2ML IJ SOLN: 4.0000 mg | Freq: Four times a day (QID) | INTRAMUSCULAR | Status: DC | PRN

## 2012-01-10 MED ORDER — CEFUROXIME SODIUM 1.5 G IJ SOLR
INTRAMUSCULAR | Status: DC | PRN
  Administered 2012-01-10: 1.5 g

## 2012-01-10 MED ORDER — WARFARIN SODIUM 7.5 MG PO TABS
7.5000 mg | ORAL_TABLET | Freq: Once | ORAL | Status: AC
  Administered 2012-01-10: 7.5 mg via ORAL
  Filled 2012-01-10: qty 1

## 2012-01-10 MED ORDER — AZELASTINE-FLUTICASONE 137-50 MCG/ACT NA SUSP: 2.0000 | Freq: Every day | NASAL | Status: DC

## 2012-01-10 MED ORDER — SODIUM CHLORIDE 0.9 % IR SOLN
Status: DC | PRN
  Administered 2012-01-10: 1

## 2012-01-10 MED ORDER — ALUM & MAG HYDROXIDE-SIMETH 200-200-20 MG/5ML PO SUSP: 30.0000 mL | ORAL | Status: DC | PRN

## 2012-01-10 MED ORDER — METHOCARBAMOL 100 MG/ML IJ SOLN
500.0000 mg | Freq: Four times a day (QID) | INTRAVENOUS | Status: DC | PRN
  Filled 2012-01-10: qty 5

## 2012-01-10 MED ORDER — ATORVASTATIN CALCIUM 10 MG PO TABS
10.0000 mg | ORAL_TABLET | Freq: Every day | ORAL | Status: DC
  Administered 2012-01-10 – 2012-01-12 (×3): 10 mg via ORAL
  Filled 2012-01-10 (×3): qty 1

## 2012-01-10 MED ORDER — WARFARIN - PHARMACIST DOSING INPATIENT: Freq: Every day | Status: DC

## 2012-01-10 MED ORDER — ALLOPURINOL 100 MG PO TABS
100.0000 mg | ORAL_TABLET | Freq: Every day | ORAL | Status: DC
  Administered 2012-01-11 – 2012-01-12 (×2): 100 mg via ORAL
  Filled 2012-01-10 (×3): qty 1

## 2012-01-10 MED ORDER — ISOSORBIDE MONONITRATE ER 30 MG PO TB24
30.0000 mg | ORAL_TABLET | Freq: Every day | ORAL | Status: DC
  Administered 2012-01-11 – 2012-01-12 (×2): 30 mg via ORAL
  Filled 2012-01-10 (×3): qty 1

## 2012-01-10 MED ORDER — PANTOPRAZOLE SODIUM 40 MG PO TBEC
40.0000 mg | DELAYED_RELEASE_TABLET | Freq: Two times a day (BID) | ORAL | Status: DC
  Administered 2012-01-10 – 2012-01-12 (×5): 40 mg via ORAL
  Filled 2012-01-10 (×5): qty 1

## 2012-01-10 MED ORDER — ONDANSETRON HCL 4 MG PO TABS: 4.0000 mg | ORAL_TABLET | Freq: Four times a day (QID) | ORAL | Status: DC | PRN

## 2012-01-10 MED ORDER — ALBUTEROL SULFATE (5 MG/ML) 0.5% IN NEBU
2.5000 mg | INHALATION_SOLUTION | Freq: Four times a day (QID) | RESPIRATORY_TRACT | Status: DC
  Administered 2012-01-10 – 2012-01-11 (×3): 2.5 mg via RESPIRATORY_TRACT
  Filled 2012-01-10 (×3): qty 0.5

## 2012-01-10 MED ORDER — HYDROMORPHONE HCL PF 1 MG/ML IJ SOLN: 1.0000 mg | INTRAMUSCULAR | Status: DC | PRN

## 2012-01-10 MED ORDER — ACETAMINOPHEN 10 MG/ML IV SOLN
1000.0000 mg | Freq: Four times a day (QID) | INTRAVENOUS | Status: AC
  Administered 2012-01-10 – 2012-01-11 (×4): 1000 mg via INTRAVENOUS
  Filled 2012-01-10 (×4): qty 100

## 2012-01-10 MED ORDER — HYDROMORPHONE HCL PF 1 MG/ML IJ SOLN
0.2500 mg | INTRAMUSCULAR | Status: DC | PRN
  Administered 2012-01-10 (×2): 0.5 mg via INTRAVENOUS

## 2012-01-10 MED ORDER — OXYCODONE HCL 5 MG PO TABS
ORAL_TABLET | ORAL | Status: AC
  Administered 2012-01-11: 10 mg via ORAL
  Filled 2012-01-10: qty 2

## 2012-01-10 MED ORDER — FLUTICASONE PROPIONATE 50 MCG/ACT NA SUSP
2.0000 | Freq: Every day | NASAL | Status: DC
  Administered 2012-01-11: 2 via NASAL
  Filled 2012-01-10: qty 16

## 2012-01-10 MED ORDER — POVIDONE-IODINE 7.5 % EX SOLN
Freq: Once | CUTANEOUS | Status: DC
  Filled 2012-01-10: qty 118

## 2012-01-10 MED ORDER — CEFAZOLIN SODIUM-DEXTROSE 2-3 GM-% IV SOLR
2.0000 g | Freq: Four times a day (QID) | INTRAVENOUS | Status: AC
  Administered 2012-01-10 (×2): 2 g via INTRAVENOUS
  Filled 2012-01-10 (×2): qty 50

## 2012-01-10 MED ORDER — LIDOCAINE HCL (CARDIAC) 20 MG/ML IV SOLN
INTRAVENOUS | Status: DC | PRN
  Administered 2012-01-10: 90 mg via INTRAVENOUS

## 2012-01-10 MED ORDER — FERROUS SULFATE 325 (65 FE) MG PO TABS
325.0000 mg | ORAL_TABLET | Freq: Two times a day (BID) | ORAL | Status: DC
  Administered 2012-01-10 – 2012-01-12 (×5): 325 mg via ORAL
  Filled 2012-01-10 (×7): qty 1

## 2012-01-10 MED ORDER — AZELASTINE HCL 0.1 % NA SOLN
2.0000 | Freq: Every day | NASAL | Status: DC
  Administered 2012-01-11: 2 via NASAL
  Filled 2012-01-10: qty 30

## 2012-01-10 MED ORDER — SALMETEROL XINAFOATE 50 MCG/DOSE IN AEPB
1.0000 | INHALATION_SPRAY | Freq: Two times a day (BID) | RESPIRATORY_TRACT | Status: DC
  Administered 2012-01-11 – 2012-01-12 (×3): 1 via RESPIRATORY_TRACT
  Filled 2012-01-10: qty 0

## 2012-01-10 MED ORDER — ONDANSETRON HCL 4 MG/2ML IJ SOLN: 4.0000 mg | Freq: Once | INTRAMUSCULAR | Status: DC | PRN

## 2012-01-10 MED ORDER — ZOLPIDEM TARTRATE 5 MG PO TABS: 5.0000 mg | ORAL_TABLET | Freq: Every evening | ORAL | Status: DC | PRN

## 2012-01-10 MED ORDER — DEXTROSE-NACL 5-0.45 % IV SOLN
INTRAVENOUS | Status: DC
  Administered 2012-01-10: 12:00:00 via INTRAVENOUS

## 2012-01-10 MED ORDER — HYDROMORPHONE HCL PF 1 MG/ML IJ SOLN
INTRAMUSCULAR | Status: AC
  Filled 2012-01-10: qty 1

## 2012-01-10 MED ORDER — LACTATED RINGERS IV SOLN
INTRAVENOUS | Status: DC | PRN
  Administered 2012-01-10 (×3): via INTRAVENOUS

## 2012-01-10 MED ORDER — METHOCARBAMOL 500 MG PO TABS
ORAL_TABLET | ORAL | Status: AC
  Filled 2012-01-10: qty 1

## 2012-01-10 MED ORDER — ACETAMINOPHEN 10 MG/ML IV SOLN
INTRAVENOUS | Status: DC | PRN
  Administered 2012-01-10: 1000 mg via INTRAVENOUS

## 2012-01-10 MED ORDER — BUPIVACAINE-EPINEPHRINE PF 0.5-1:200000 % IJ SOLN
INTRAMUSCULAR | Status: DC | PRN
  Administered 2012-01-10: 25 mL

## 2012-01-10 MED ORDER — MIDAZOLAM HCL 5 MG/5ML IJ SOLN
INTRAMUSCULAR | Status: DC | PRN
  Administered 2012-01-10: 2 mg via INTRAVENOUS
  Administered 2012-01-10 (×2): 1 mg via INTRAVENOUS

## 2012-01-10 MED ORDER — CEFUROXIME SODIUM 1.5 G IJ SOLR
INTRAMUSCULAR | Status: AC
  Filled 2012-01-10: qty 1.5

## 2012-01-10 MED ORDER — ONDANSETRON HCL 4 MG/2ML IJ SOLN
INTRAMUSCULAR | Status: DC | PRN
  Administered 2012-01-10: 4 mg via INTRAVENOUS

## 2012-01-10 MED ORDER — FENTANYL CITRATE 0.05 MG/ML IJ SOLN
INTRAMUSCULAR | Status: DC | PRN
  Administered 2012-01-10 (×2): 50 ug via INTRAVENOUS
  Administered 2012-01-10: 100 ug via INTRAVENOUS
  Administered 2012-01-10: 50 ug via INTRAVENOUS
  Administered 2012-01-10: 100 ug via INTRAVENOUS
  Administered 2012-01-10: 50 ug via INTRAVENOUS

## 2012-01-10 MED ORDER — FLUTICASONE PROPIONATE HFA 44 MCG/ACT IN AERO
2.0000 | INHALATION_SPRAY | Freq: Two times a day (BID) | RESPIRATORY_TRACT | Status: DC
  Administered 2012-01-10 – 2012-01-12 (×5): 2 via RESPIRATORY_TRACT
  Filled 2012-01-10: qty 10.6

## 2012-01-10 MED ORDER — ALBUTEROL SULFATE HFA 108 (90 BASE) MCG/ACT IN AERS
2.0000 | INHALATION_SPRAY | Freq: Four times a day (QID) | RESPIRATORY_TRACT | Status: DC
  Filled 2012-01-10: qty 6.7

## 2012-01-10 SURGICAL SUPPLY — 67 items
APL SKNCLS STERI-STRIP NONHPOA (GAUZE/BANDAGES/DRESSINGS) ×2
BANDAGE ESMARK 6X9 LF (GAUZE/BANDAGES/DRESSINGS) ×2 IMPLANT
BENZOIN TINCTURE PRP APPL 2/3 (GAUZE/BANDAGES/DRESSINGS) ×3 IMPLANT
BLADE SAGITTAL 25.0X1.19X90 (BLADE) ×3 IMPLANT
BLADE SAW SAG 90X13X1.27 (BLADE) ×3 IMPLANT
BNDG CMPR 9X6 STRL LF SNTH (GAUZE/BANDAGES/DRESSINGS) ×2
BNDG ESMARK 6X9 LF (GAUZE/BANDAGES/DRESSINGS) ×3
BOWL SMART MIX CTS (DISPOSABLE) ×3 IMPLANT
CEMENT HV SMART SET (Cement) ×6 IMPLANT
CLOTH BEACON ORANGE TIMEOUT ST (SAFETY) ×3 IMPLANT
COVER BACK TABLE 24X17X13 BIG (DRAPES) ×2 IMPLANT
COVER SURGICAL LIGHT HANDLE (MISCELLANEOUS) ×3 IMPLANT
CUFF TOURNIQUET SINGLE 34IN LL (TOURNIQUET CUFF) ×3 IMPLANT
CUFF TOURNIQUET SINGLE 44IN (TOURNIQUET CUFF) IMPLANT
DRAPE EXTREMITY T 121X128X90 (DRAPE) ×3 IMPLANT
DRAPE PROXIMA HALF (DRAPES) ×2 IMPLANT
DRAPE U-SHAPE 47X51 STRL (DRAPES) ×3 IMPLANT
DRSG PAD ABDOMINAL 8X10 ST (GAUZE/BANDAGES/DRESSINGS) ×3 IMPLANT
DURAPREP 26ML APPLICATOR (WOUND CARE) ×3 IMPLANT
ELECT REM PT RETURN 9FT ADLT (ELECTROSURGICAL) ×3
ELECTRODE REM PT RTRN 9FT ADLT (ELECTROSURGICAL) ×2 IMPLANT
EVACUATOR 1/8 PVC DRAIN (DRAIN) ×3 IMPLANT
FACESHIELD LNG OPTICON STERILE (SAFETY) ×3 IMPLANT
GAUZE XEROFORM 5X9 LF (GAUZE/BANDAGES/DRESSINGS) ×3 IMPLANT
GLOVE BIOGEL PI IND STRL 8 (GLOVE) ×4 IMPLANT
GLOVE BIOGEL PI INDICATOR 8 (GLOVE) ×2
GLOVE ECLIPSE 7.5 STRL STRAW (GLOVE) ×6 IMPLANT
GOWN PREVENTION PLUS XLARGE (GOWN DISPOSABLE) ×7 IMPLANT
GOWN SRG XL XLNG 56XLVL 4 (GOWN DISPOSABLE) ×2 IMPLANT
GOWN STRL NON-REIN LRG LVL3 (GOWN DISPOSABLE) ×3 IMPLANT
GOWN STRL NON-REIN XL XLG LVL4 (GOWN DISPOSABLE) ×3
HANDPIECE INTERPULSE COAX TIP (DISPOSABLE) ×3
HOOD PEEL AWAY FACE SHEILD DIS (HOOD) ×9 IMPLANT
IMMOBILIZER KNEE 20 (SOFTGOODS)
IMMOBILIZER KNEE 20 THIGH 36 (SOFTGOODS) IMPLANT
IMMOBILIZER KNEE 22 UNIV (SOFTGOODS) ×3 IMPLANT
IMMOBILIZER KNEE 24 THIGH 36 (MISCELLANEOUS) IMPLANT
IMMOBILIZER KNEE 24 UNIV (MISCELLANEOUS)
KIT BASIN OR (CUSTOM PROCEDURE TRAY) ×3 IMPLANT
KIT ROOM TURNOVER OR (KITS) ×3 IMPLANT
MANIFOLD NEPTUNE II (INSTRUMENTS) ×3 IMPLANT
MARKER SPHERE PSV REFLC THRD 5 (MARKER) ×3 IMPLANT
NDL HYPO 25GX1X1/2 BEV (NEEDLE) IMPLANT
NEEDLE HYPO 25GX1X1/2 BEV (NEEDLE) IMPLANT
NS IRRIG 1000ML POUR BTL (IV SOLUTION) ×3 IMPLANT
PACK TOTAL JOINT (CUSTOM PROCEDURE TRAY) ×3 IMPLANT
PAD ARMBOARD 7.5X6 YLW CONV (MISCELLANEOUS) ×6 IMPLANT
PAD CAST 4YDX4 CTTN HI CHSV (CAST SUPPLIES) ×2 IMPLANT
PADDING CAST COTTON 4X4 STRL (CAST SUPPLIES) ×3
PADDING CAST COTTON 6X4 STRL (CAST SUPPLIES) ×2 IMPLANT
PIN SCHANZ 4MM 130MM (PIN) ×4 IMPLANT
SET HNDPC FAN SPRY TIP SCT (DISPOSABLE) ×2 IMPLANT
SPONGE GAUZE 4X4 12PLY (GAUZE/BANDAGES/DRESSINGS) ×3 IMPLANT
STAPLER VISISTAT 35W (STAPLE) IMPLANT
STRIP CLOSURE SKIN 1/2X4 (GAUZE/BANDAGES/DRESSINGS) ×3 IMPLANT
SUCTION FRAZIER TIP 10 FR DISP (SUCTIONS) ×3 IMPLANT
SUT MON AB 3-0 SH 27 (SUTURE)
SUT MON AB 3-0 SH27 (SUTURE) IMPLANT
SUT VIC AB 0 CTB1 27 (SUTURE) ×6 IMPLANT
SUT VIC AB 1 CT1 27 (SUTURE) ×6
SUT VIC AB 1 CT1 27XBRD ANBCTR (SUTURE) ×4 IMPLANT
SUT VIC AB 2-0 CTB1 (SUTURE) ×6 IMPLANT
SYR CONTROL 10ML LL (SYRINGE) IMPLANT
TOWEL OR 17X24 6PK STRL BLUE (TOWEL DISPOSABLE) ×3 IMPLANT
TOWEL OR 17X26 10 PK STRL BLUE (TOWEL DISPOSABLE) ×3 IMPLANT
TRAY FOLEY CATH 14FR (SET/KITS/TRAYS/PACK) ×3 IMPLANT
WATER STERILE IRR 1000ML POUR (IV SOLUTION) ×9 IMPLANT

## 2012-01-10 NOTE — Consult Note (Signed)
Name: Elver Stadler MRN: [id number] DOB: [birth year]    LOS: 0  PULMONARY / CRITICAL CARE NOTE  History of Present Illness:   66 year old male, never smoked, f/b K Clance in out pt setting for intrinsic asthma and remote allergic bronchopulmonary aspergillosis and C [facility] for allergy / asthma. Last seen in our office on 8/2 and felt to be in good control of his reactive airway disease. Last exacerbation in May this year exacerbated by seasonal rhinitis and treated w/ pulse of his prednisone w/ good relief. States in last month he has needed his rescue SABA roughly three different occasions which he attributed to the humidity, otherwise no issues. Presents to [facility] on 8/16 for elective TKR. PCCM asked to evaluate post-op to make any necessary pulmonary recommendations that may assist w/ post-op recovery.   Past Medical History  Diagnosis Date  . Asthma   . Degenerative disk disease   . ALLERGIC RHINITIS   . COPD (chronic obstructive pulmonary disease)   . GERD (gastroesophageal reflux disease)    Past Surgical History  Procedure Date  . Knee surgery   . Gallbladder surgery   . Cataract extraction   . Total hip arthroplasty   . Back surgery   . Cholecystectomy   . Joint replacement   . Eye surgery   . Tonsillectomy   . Cardiac catheterization     Dr. Chales Abrahams   Prior to Admission medications   Medication Sig Start Date End Date Taking? Authorizing Provider  allopurinol (ZYLOPRIM) 100 MG tablet Take 100 mg by mouth daily.     Yes Historical Provider, MD  atorvastatin (LIPITOR) 10 MG tablet Take 10 mg by mouth daily.    Yes Historical Provider, MD  beclomethasone (QVAR) 80 MCG/ACT inhaler Inhale 2 puffs into the lungs 2 (two) times daily. 11/07/11 11/06/12 Yes [name], MD  formoterol (FORADIL AEROLIZER) 12 MCG capsule for inhaler Place 1 capsule (12 mcg total) into inhaler and inhale 2 (two) times daily. 11/07/11 11/07/12 Yes [name], MD  isosorbide mononitrate (IMDUR) 30 MG 24  hr tablet Take 30 mg by mouth daily.  06/17/11  Yes Historical Provider, MD  pantoprazole (PROTONIX) 40 MG tablet Take 1 tablet (40 mg total) by mouth 2 (two) times daily. 11/07/11 11/06/12 Yes [name], MD  pirbuterol (MAXAIR) 200 MCG/INH inhaler Inhale 2 puffs into the lungs 4 (four) times daily as needed. For shortness of breath 11/07/11 11/06/12 Yes [name], MD  predniSONE (DELTASONE) 10 MG tablet Take 10 mg by mouth daily.   Yes Historical Provider, MD  Azelastine-Fluticasone (DYMISTA) 137-50 MCG/ACT SUSP Place 2 puffs into the nose at bedtime. 12/27/11 12/26/12  Waymon Budge, MD   Allergies Allergies  Allergen Reactions  . Levofloxacin     REACTION: itching  . Morphine And Related     Light headed and sweaty   Family History Family History  Problem Relation Age of Onset  . Heart disease Father   . Prostate cancer Brother    Social History  reports that he has never smoked. He has never used smokeless tobacco. He reports that he does not drink alcohol or use illicit drugs.  Review Of Systems   Review of Systems  Constitutional: No weight loss, gain, night sweats, Fevers, chills, fatigue .  HEENT: No headaches, visual changes, Difficulty swallowing, Tooth/dental problems, or Sore throat,  No sneezing, itching, ear ache, nasal congestion, some post nasal drip, no visual complaints CV: No chest pain,  Orthopnea, PND, swelling in lower extremities, dizziness, palpitations, syncope.  GI No heartburn, indigestion, abdominal pain, nausea, vomiting, diarrhea, change in bowel habits, loss of appetite, bloody stools.  Resp: No cough, No coughing up of blood. No change in color of mucus. No wheezing.  Skin: no rash or itching or icterus GU: no dysuria, change in color of urine, no urgency or frequency. No flank pain, no hematuria  MS: anticipated post-op pain  Psych: No change in mood or affect. No depression or anxiety.  Neuro: no difficulty with speech, weakness, numbness, ataxia    Vital Signs: Temp:  [97.6 F (36.4 C)-98.1 F (36.7 C)] 97.6 F (36.4 C) (08/16 1201) Pulse Rate:  [54-67] 62  (08/16 1201) Resp:  [8-18] 18  (08/16 1201) BP: (122-139)/(66-95) 124/79 mmHg (08/16 1201) SpO2:  [96 %-100 %] 100 % (08/16 1455) FiO2 (%):  [2 %] 2 % (08/16 1201) Weight:  [73.2 kg (161 lb 6 oz)] 73.2 kg (161 lb 6 oz) (08/16 1206) Intake/Output      08/15 0701 - 08/16 0700 08/16 0701 - 08/17 0700   I.V. (mL/kg)  2250 (30.7)   Total Intake(mL/kg)  2250 (30.7)   Urine (mL/kg/hr)  475 (0.7)   Drains  650   Blood  150   Total Output  1275   Net  +975         Physical Examination: General:  Well developed 66 year old male. Not currently in acute distress Neuro:  Awake and oriented. No focal def HEENT: [place], no JVD, no adenopathy Cardiovascular:  rr Lungs:  CTA w/out wheeze Abdomen:  Soft non-tender Musculoskeletal:  Left leg in ROM device Skin:  intact  Labs:   Lab 01/07/12 1434  HGB 14.3  HCT 41.8  WBC 9.1  PLT 189  NA 139  K 4.0  CL 100  CO2 28  GLUCOSE 102*  BUN 21  CREATININE 0.79  CALCIUM 9.7  MG --  PHOS --  AST 25  ALT 17  ALKPHOS 60  BILITOT 0.7  PROT 7.0  ALBUMIN 4.2  INR 0.98  APTT 28  INR 0.98  LATICACIDVEN --  TROPONINI --   CXR:  12/20 >>> nad  ASSESSMENT AND PLAN  Intrinsic asthma, good control on oral pred, QVAR and foradil. No evidence of exacerbation Allergic rhinitis/seasonal allergies  Left TKR GERD  -->  Resume LABA/ICS and oral pred regimen.  No indications for pulse pred at this point. Currently on hospital formulary of Flovent and Serevent. Would resume home rx on d/c -->  Routine post-op pulm hygiene w/ IS and early mobilization -->  Cont inhaled nasal dymista (or hospital substitute) -->  CXR PRN -->  Wean FIO2 as tolerated.  -->  Resume Pantoprazole -->  Will sign off.  Please reconsult as needed.  BABCOCK,PETE 01/10/2012, 1:12 PM  Patient examined.  Records reviewed.  Case discussed with NP.  Assessment  and plan as above.  Orlean Bradford, M.D., F.C.C.P. Pulmonary and Critical Care Medicine [facility] Cell: [phone] Pager: [phone]

## 2012-01-10 NOTE — Progress Notes (Signed)
Orthopedic Tech Progress Note Patient Details:  Levi [name] 1945/07/05 [id number]      [name] 01/10/2012, 11:25 AM

## 2012-01-10 NOTE — Preoperative (Signed)
Beta Blockers   Reason not to administer Beta Blockers:Not Applicable 

## 2012-01-10 NOTE — Anesthesia Postprocedure Evaluation (Signed)
  Anesthesia Post-op Note  Patient: Levi [name]  Procedure(s) Performed: Procedure(s) (LRB): TOTAL KNEE ARTHROPLASTY ()  Patient Location: PACU  Anesthesia Type: GA combined with regional for post-op pain  Level of Consciousness: awake, alert  and oriented  Airway and Oxygen Therapy: Patient Spontanous Breathing and Patient connected to nasal cannula oxygen  Post-op Pain: mild  Post-op Assessment: Post-op Vital signs reviewed  Post-op Vital Signs: Reviewed  Complications: No apparent anesthesia complications

## 2012-01-10 NOTE — Progress Notes (Signed)
Orthopedic Tech Progress Note Patient Details:  Levi [name] [birth year] [id number]  CPM Left Knee CPM Left Knee: On Left Knee Flexion (Degrees): 60  Left Knee Extension (Degrees): 0  Additional Comments: trapeze bar   [name] 01/10/2012, 11:25 AM

## 2012-01-10 NOTE — Anesthesia Preprocedure Evaluation (Addendum)
Anesthesia Evaluation  Patient identified by MRN, date of birth, ID band Patient awake    Reviewed: Allergy & Precautions, H&P , NPO status , Patient's Chart, lab work & pertinent test results, reviewed documented beta blocker date and time   Airway Mallampati: I TM Distance: >3 FB Neck ROM: Full    Dental  (+) Teeth Intact, Dental Advisory Given and Caps   Pulmonary asthma , COPD COPD inhaler,  breath sounds clear to auscultation        Cardiovascular Rhythm:Regular Rate:Normal     Neuro/Psych    GI/Hepatic GERD-  Medicated and Controlled,  Endo/Other    Renal/GU      Musculoskeletal   Abdominal   Peds  Hematology   Anesthesia Other Findings Fixed upper front bridge  Reproductive/Obstetrics                         Anesthesia Physical Anesthesia Plan  ASA: II  Anesthesia Plan: General   Post-op Pain Management:    Induction: Intravenous  Airway Management Planned: LMA  Additional Equipment:   Intra-op Plan:   Post-operative Plan: Extubation in OR  Informed Consent: I have reviewed the patients History and Physical, chart, labs and discussed the procedure including the risks, benefits and alternatives for the proposed anesthesia with the patient or authorized representative who has indicated his/her understanding and acceptance.   Dental advisory given  Plan Discussed with: CRNA, Anesthesiologist and Surgeon  Anesthesia Plan Comments:         Anesthesia Quick Evaluation

## 2012-01-10 NOTE — Brief Op Note (Signed)
01/10/2012  9:44 AM  PATIENT:  Joaquim Nam  66 y.o. male  PRE-OPERATIVE DIAGNOSIS:  degenerative joint disease left knee  POST-OPERATIVE DIAGNOSIS:  degenerative joint disease left knee  PROCEDURE:  Procedure(s) (LRB): TOTAL KNEE ARTHROPLASTY ()  SURGEON:  Surgeon(s) and Role:    * Harvie Junior, MD - Primary  PHYSICIAN ASSISTANT:   ASSISTANTS: bethune   ANESTHESIA:   general  EBL:  Total I/O In: 2050 [I.V.:2050] Out: 100 [Urine:100]  BLOOD ADMINISTERED:none  DRAINS: (1) Hemovact drain(s) in the l knee with  Suction Open   LOCAL MEDICATIONS USED:  NONE  SPECIMEN:  No Specimen  DISPOSITION OF SPECIMEN:  N/A  COUNTS:  YES  TOURNIQUET:  * Missing tourniquet times found for documented tourniquets in log:  50095 *  DICTATION: .Other Dictation: Dictation Number [id number]  PLAN OF CARE: Admit to inpatient   PATIENT DISPOSITION:  PACU - hemodynamically stable.   Delay start of Pharmacological VTE agent (>24hrs) due to surgical blood loss or risk of bleeding: no

## 2012-01-10 NOTE — Transfer of Care (Signed)
Immediate Anesthesia Transfer of Care Note  Patient: Levi [name]  Procedure(s) Performed: Procedure(s) (LRB): TOTAL KNEE ARTHROPLASTY ()  Patient Location: PACU  Anesthesia Type: General  Level of Consciousness: awake and patient cooperative  Airway & Oxygen Therapy: Patient Spontanous Breathing and Patient connected to nasal cannula oxygen  Post-op Assessment: Report given to PACU RN, Post -op Vital signs reviewed and stable and Patient moving all extremities  Post vital signs: Reviewed and stable  Complications: No apparent anesthesia complications

## 2012-01-10 NOTE — H&P (Signed)
PREOPERATIVE H&P  Chief Complaint: l knee pain  HPI: Levi [name] is a 66 y.o. male who presents for evaluation of l. Knee pain. It has been present for greater than 1 year and has been worsening. He has failed conservative measures. Pain is rated as severe.  Pt has failed use of cane, activity modification,pt, and injections.  Past Medical History  Diagnosis Date  . Asthma   . Degenerative disk disease   . ALLERGIC RHINITIS   . COPD (chronic obstructive pulmonary disease)   . GERD (gastroesophageal reflux disease)    Past Surgical History  Procedure Date  . Knee surgery   . Gallbladder surgery   . Cataract extraction   . Total hip arthroplasty   . Back surgery   . Cholecystectomy   . Joint replacement   . Eye surgery   . Tonsillectomy   . Cardiac catheterization     Dr. Chales Abrahams   History   Social History  . Marital Status: Married    Spouse Name: N/A    Number of Children: N/A  . Years of Education: N/A   Occupational History  . 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     Social History Main Topics  . Smoking status: Never Smoker   . Smokeless tobacco: Never Used   Comment: Smoked rarely  . Alcohol Use: No  . Drug Use: No  . Sexually Active: Not on file   Other Topics Concern  . Not on file   Social History Narrative  . No narrative on file   Family History  Problem Relation Age of Onset  . Heart disease Father   . Prostate cancer Brother    Allergies  Allergen Reactions  . Levofloxacin     REACTION: itching  . Morphine And Related     Light headed and sweaty   Prior to Admission medications   Medication Sig Start Date End Date Taking? Authorizing Provider  allopurinol (ZYLOPRIM) 100 MG tablet Take 100 mg by mouth daily.     Yes Historical Provider, MD  atorvastatin (LIPITOR) 10 MG tablet Take 10 mg by mouth daily.    Yes Historical Provider, MD  beclomethasone (QVAR) 80 MCG/ACT inhaler Inhale 2 puffs into the lungs 2 (two) times daily. 11/07/11  11/06/12 Yes [name], MD  formoterol (FORADIL AEROLIZER) 12 MCG capsule for inhaler Place 1 capsule (12 mcg total) into inhaler and inhale 2 (two) times daily. 11/07/11 11/07/12 Yes [name], MD  isosorbide mononitrate (IMDUR) 30 MG 24 hr tablet Take 30 mg by mouth daily.  06/17/11  Yes Historical Provider, MD  pantoprazole (PROTONIX) 40 MG tablet Take 1 tablet (40 mg total) by mouth 2 (two) times daily. 11/07/11 11/06/12 Yes [name], MD  pirbuterol (MAXAIR) 200 MCG/INH inhaler Inhale 2 puffs into the lungs 4 (four) times daily as needed. For shortness of breath 11/07/11 11/06/12 Yes [name], MD  predniSONE (DELTASONE) 10 MG tablet Take 10 mg by mouth daily.   Yes Historical Provider, MD  Azelastine-Fluticasone (DYMISTA) 137-50 MCG/ACT SUSP Place 2 puffs into the nose at bedtime. 12/27/11 12/26/12  Waymon Budge, MD     Positive ROS: none  All other systems have been reviewed and were otherwise negative with the exception of those mentioned in the HPI and as above.  Physical Exam: Filed Vitals:   01/10/12 0622  BP: 135/72  Pulse: 54  Temp: 98.1 F (36.7 C)  Resp: 16  General: Alert, no acute distress Cardiovascular: No pedal edema Respiratory: No cyanosis, no use of accessory musculature GI: No organomegaly, abdomen is soft and non-tender Skin: No lesions in the area of chief complaint Neurologic: Sensation intact distally Psychiatric: Patient is competent for consent with normal mood and affect Lymphatic: No axillary or cervical lymphadenopathy  MUSCULOSKELETAL: L KNEE: pain on rom.  No instability rom 0-100   Assessment/Plan: degenerative joint disease Plan for Procedure(s):  TOTAL KNEE ARTHROPLASTY left  The risks benefits and alternatives were discussed with the patient including but not limited to the risks of nonoperative treatment, versus surgical intervention including infection, bleeding, nerve injury, malunion, nonunion, hardware prominence,  hardware failure, need for hardware removal, blood clots, cardiopulmonary complications, morbidity, mortality, among others, and they were willing to proceed.  Predicted outcome is good, although there will be at least a six to nine month expected recovery.  [name],[name], MD 01/10/2012 7:35 AM

## 2012-01-10 NOTE — Progress Notes (Signed)
Patients hemovac output 400. MD in room stated to clamp. 2 hours and unclamp. Will continue to monitor closely

## 2012-01-10 NOTE — Anesthesia Procedure Notes (Addendum)
Anesthesia Regional Block:  Femoral nerve block  Pre-Anesthetic Checklist: ,, timeout performed, Correct Patient, Correct Site, Correct Laterality, Correct Procedure, Correct Position, site marked, Risks and benefits discussed,  Surgical consent,  Pre-op evaluation,  At surgeon's request and post-op pain management  Laterality: Left and Lower  Prep: chloraprep       Needles:  Injection technique: Single-shot  Needle Type: Echogenic Needle     Needle Length: 9cm  Needle Gauge: 21    Additional Needles:  Procedures: ultrasound guided Femoral nerve block Narrative:  Start time: 01/10/2012 6:55 AM End time: 01/10/2012 7:07 AM Injection made incrementally with aspirations every 5 mL.  Performed by: Personally  Anesthesiologist: Sheldon Silvan, MD  Additional Notes: Marcaine 0.5% with EPI 1:200000  Femoral nerve block Procedure Name: LMA Insertion Date/Time: 01/10/2012 7:53 AM Performed by: Darcey Nora B Pre-anesthesia Checklist: Patient identified, Emergency Drugs available, Suction available and Patient being monitored Patient Re-evaluated:Patient Re-evaluated prior to inductionOxygen Delivery Method: Circle system utilized Preoxygenation: Pre-oxygenation with 100% oxygen Intubation Type: IV induction Ventilation: Mask ventilation without difficulty LMA: LMA with gastric port inserted LMA Size: 5.0    Procedure Name: LMA Insertion Date/Time: 01/10/2012 7:53 AM Performed by: Darcey Nora B Pre-anesthesia Checklist: Patient identified, Emergency Drugs available, Suction available and Patient being monitored Patient Re-evaluated:Patient Re-evaluated prior to inductionOxygen Delivery Method: Circle system utilized Preoxygenation: Pre-oxygenation with 100% oxygen Intubation Type: IV induction Ventilation: Mask ventilation without difficulty LMA: LMA with gastric port inserted LMA Size: 5.0 Number of attempts: 2 (Had to deepen with first attempt) Placement Confirmation:  positive ETCO2 and breath sounds checked- equal and bilateral Tube secured with: Tape (across flange to cheeks) Dental Injury: Teeth and Oropharynx as per pre-operative assessment

## 2012-01-10 NOTE — Progress Notes (Signed)
ANTICOAGULATION CONSULT NOTE - Initial Consult  Pharmacy Consult for Coumadin Indication: VTE prophylaxis s/p L TKA  Allergies  Allergen Reactions  . Levofloxacin     REACTION: itching  . Morphine And Related     Light headed and sweaty    Patient Measurements: Height: 6' 2.02" (188 cm) Weight: 161 lb 6 oz (73.2 kg) IBW/kg (Calculated) : 82.24   Vital Signs: Temp: 97.6 F (36.4 C) (08/16 1201) Temp src: Oral (08/16 0622) BP: 124/79 mmHg (08/16 1201) Pulse Rate: 62  (08/16 1201)  Labs:  Basename 01/07/12 1434  HGB 14.3  HCT 41.8  PLT 189  APTT 28  LABPROT 13.2  INR 0.98  HEPARINUNFRC --  CREATININE 0.79  CKTOTAL --  CKMB --  TROPONINI --    Estimated Creatinine Clearance: 96.6 ml/min (by C-G formula based on Cr of 0.79).   Medical History: Past Medical History  Diagnosis Date  . Asthma   . Degenerative disk disease   . ALLERGIC RHINITIS   . COPD (chronic obstructive pulmonary disease)   . GERD (gastroesophageal reflux disease)     Medications:  Scheduled:    . acetaminophen  1,000 mg Intravenous Q6H  . albuterol  2.5 mg Nebulization Q6H  . allopurinol  100 mg Oral Daily  . atorvastatin  10 mg Oral Daily  . azelastine  2 spray Each Nare QHS   And  . fluticasone  2 spray Each Nare QHS  .  ceFAZolin (ANCEF) IV  2 g Intravenous 60 min Pre-Op  .  ceFAZolin (ANCEF) IV  2 g Intravenous Q6H  . docusate sodium  100 mg Oral BID  . ferrous sulfate  325 mg Oral BID WC  . fluticasone  2 puff Inhalation BID  . HYDROmorphone      . isosorbide mononitrate  30 mg Oral Daily  . methocarbamol      . oxyCODONE      . pantoprazole  40 mg Oral BID WC  . predniSONE  10 mg Oral Q breakfast  . salmeterol  1 puff Inhalation Q12H  . DISCONTD: albuterol  2 puff Inhalation Q6H  . DISCONTD: Azelastine-Fluticasone  2 puff Nasal QHS  . DISCONTD: povidone-iodine   Topical Once    Assessment: 66 yr old male on coumadin for VTE prophylaxis s/p L TKA.    Goal of  Therapy:  INR= 2-3 (order states to shoot for INR=2) Monitor platelets by anticoagulation protocol: Yes   Plan:  Coumadin 7.5mg  po x 1 dose tonight. Daily PT/INR.  Wendie Simmer, PharmD, BCPS Clinical Pharmacist  Pager: [phone]

## 2012-01-11 LAB — CBC
HCT: 32.7 % — ABNORMAL LOW (ref 39.0–52.0)
Hemoglobin: 11.2 g/dL — ABNORMAL LOW (ref 13.0–17.0)
MCH: 32.8 pg (ref 26.0–34.0)
MCHC: 34.3 g/dL (ref 30.0–36.0)
MCV: 95.9 fL (ref 78.0–100.0)
Platelets: 152 10*3/uL (ref 150–400)
RBC: 3.41 MIL/uL — ABNORMAL LOW (ref 4.22–5.81)
RDW: 13.8 % (ref 11.5–15.5)
WBC: 10.6 10*3/uL — ABNORMAL HIGH (ref 4.0–10.5)

## 2012-01-11 LAB — BASIC METABOLIC PANEL
BUN: 15 mg/dL (ref 6–23)
CO2: 28 mEq/L (ref 19–32)
Calcium: 8.5 mg/dL (ref 8.4–10.5)
Chloride: 101 mEq/L (ref 96–112)
Creatinine, Ser: 0.65 mg/dL (ref 0.50–1.35)
GFR calc Af Amer: >90 mL/min (ref >90)
GFR calc non Af Amer: >90 mL/min (ref >90)
Glucose, Bld: 129 mg/dL — ABNORMAL HIGH (ref 70–99)
Potassium: 3.3 mEq/L — ABNORMAL LOW (ref 3.5–5.1)
Sodium: 137 mEq/L (ref 135–145)

## 2012-01-11 LAB — PROTIME-INR
INR: 1.01 (ref 0.00–1.49)
Prothrombin Time: 13.5 seconds (ref 11.6–15.2)

## 2012-01-11 MED ORDER — WARFARIN VIDEO: Freq: Once | Status: DC

## 2012-01-11 MED ORDER — ALBUTEROL SULFATE HFA 108 (90 BASE) MCG/ACT IN AERS
2.0000 | INHALATION_SPRAY | Freq: Four times a day (QID) | RESPIRATORY_TRACT | Status: DC | PRN
  Administered 2012-01-11: 2 via RESPIRATORY_TRACT
  Filled 2012-01-11: qty 6.7

## 2012-01-11 MED ORDER — WARFARIN SODIUM 7.5 MG PO TABS
7.5000 mg | ORAL_TABLET | Freq: Once | ORAL | Status: AC
  Administered 2012-01-11: 7.5 mg via ORAL
  Filled 2012-01-11: qty 1

## 2012-01-11 MED ORDER — COUMADIN BOOK
Freq: Once | Status: DC
  Filled 2012-01-11: qty 1

## 2012-01-11 NOTE — Progress Notes (Addendum)
PATIENT ID: Levi [name]  MRN: [id number]  DOB/AGE:  January 20, 1946 / 66 y.o.  1 Day Post-Op Procedure(s) (LRB): TOTAL KNEE ARTHROPLASTY ()    PROGRESS NOTE Subjective: Patient is alert, oriented, no Nausea, no Vomiting, yes passing gas, no Bowel Movement. Taking PO well. Denies SOB, Chest or Calf Pain. Using Incentive Spirometer, PAS in place. Patient reports pain as moderate  .    Objective: Vital signs in last 24 hours: Filed Vitals:   01/10/12 1944 01/10/12 2234 01/11/12 0230 01/11/12 0503  BP:  139/69  133/62  Pulse:  70  74  Temp:  97.8 F (36.6 C)  98.1 F (36.7 C)  TempSrc:      Resp:  18  16  Height:      Weight:      SpO2: 99% 100% 99% 98%      Intake/Output from previous day: I/O last 3 completed shifts: In: 3150 [I.V.:3000; IV Piggyback:150] Out: 2525 [Urine:1325; Drains:1050; Blood:150]   Intake/Output this shift: Total I/O In: 240 [P.O.:240] Out: -    LABORATORY DATA:  Basename 01/11/12 0437  WBC 10.6*  HGB 11.2*  HCT 32.7*  PLT 152  NA 137  K 3.3*  CL 101  CO2 28  BUN 15  CREATININE 0.65  GLUCOSE 129*  GLUCAP --  INR 1.01  CALCIUM 8.5    Examination: Neurologically intact ABD soft Neurovascular intact Sensation intact distally Intact pulses distally Dorsiflexion/Plantar flexion intact Incision: dressing C/D/I}  Assessment:   1 Day Post-Op Procedure(s) (LRB): TOTAL KNEE ARTHROPLASTY () ADDITIONAL DIAGNOSIS:  none  Plan: PT/OT WBAT, CPM 5/hrs day until ROM 0-90 degrees, then D/C CPM DVT Prophylaxis:  SCDx72hrs, Coumadin x 1 month DISCHARGE PLAN: Home Sunday if PT goals met. DISCHARGE NEEDS: HHPT, HHRN, CPM, Walker and 3-in-1 comode seat     Murl Zogg M. 01/11/2012, 8:06 AM

## 2012-01-11 NOTE — Op Note (Signed)
NAME:  LADEN, FIELDHOUSE NO.:  [id number]  MEDICAL RECORD NO.:  [id number]  LOCATION:  [id number]                        FACILITY:  [facility]  PHYSICIAN:  Harvie Junior, M.D.   DATE OF BIRTH:  1945-12-30  DATE OF PROCEDURE:  01/10/2012 DATE OF DISCHARGE:                              OPERATIVE REPORT   PREOPERATIVE DIAGNOSIS:  End-stage degenerative joint disease, left knee.  POSTOPERATIVE DIAGNOSIS:  End-stage degenerative joint disease, left knee.  PROCEDURE:  Left total knee replacement with a Sigma system, size 4 femur, size 5 tibia, 10 mm bridging bearing, and a 41-mm all polyethylene patella.  SURGEON:  Harvie Junior, M.D.  ASSISTANT:  Marshia Ly, PA.  ANESTHESIA:  General.  BRIEF HISTORY:  Mr. Hoogendoorn is a 66 year old with a long history of severe pain in the left knee, which we treated conservatively for about 4 years.  He has had multiple injections.  He has had physical therapy and activity modification.  Because of failure of all conservative care, x-ray showing bone-on-bone changes, he is also taken to operating room for left total knee replacement.  PROCEDURE:  The patient was taken to the operating room and after adequate anesthesia was obtained with general anesthetic, the patient was placed supine on the operating table, the left leg was prepped and draped in sterile fashion.  Following this, the leg was exsanguinated. Blood pressure tourniquet inflated to 350 mmHg.  Following this, a midline incision was made in subcutaneous tissue down to the level of the extensor mechanism and a medial parapatellar arthrotomy was undertaken.  Once this was completed, attention was then turned towards removal of the medial and lateral meniscus, retropatellar fat pad, and synovium in the anterior aspect of the femur, in the anterior and posterior cruciates. The tibial guide was then assembled and the tibia was cut perpendicular to its long axis with an  extramedullary guide. Attention then turned to the femur where an intramedullary guide was used to cut 5 degrees off of the distal femur.  Spacer blocks were put in place and there was a reasonable extension gap at this time. Attention then turned towards the femur, which was sized to a 4. Anterior and posterior cuts were made, chamfer cuts, and box.  Attention turned to the tibia, sized to a 5, drilled and keeled.  The trial components were then placed, size 5 tibia, size 4 femur, 10 mm bridging bearing, easy full extension was achieved.  At this point, good gap balance.  Attention was then turned towards the patella, which was cut down to a level of 14 mm, which was taking 13 mm off of the patella and then it was resurfaced with a 41-mm all polyethylene patella.  The knee was put through a range of motion, had excellent stability and range of motion.  At this point, all trial components were removed and then the knee was copiously and thoroughly lavaged and suctioned dry.  The final components were then cemented into place, size 4 femur, size 5 tibia, 10 mm bridging bearing trial was placed.  The 41 mm all polyethylene patella was cemented in place and held with a clamp.  All cement  was allowed to harden.  All excess bone cement was removed.  At this time, attention was then turned after the cement was hardened towards letting down the tourniquet.  All bleedings were controlled with electrocautery. The trial poly was removed and the final 10 poly was placed.  Once this was done, the knee was put through a range of motion, excellent stability and range of motion.  A medium Hemovac drain was placed.  The medial parapatellar arthrotomy was closed with 1 Vicryl running, the skin with 0 and 2-0 Vicryl, and 3-0 Monocryl subcuticular.  Benzoin and Steri-Strips were applied.  Dry sterile compressive dressing was applied.  The patient was taken to recovery room, where he was noted to be in  satisfactory condition. Estimated blood loss for this procedure was less than 50 mL.     Harvie Junior, M.D.     Ranae Plumber  D:  01/10/2012  T:  01/11/2012  Job:  [id number]

## 2012-01-11 NOTE — Progress Notes (Signed)
ANTICOAGULATION CONSULT NOTE - Initial Consult  Pharmacy Consult for Coumadin Indication: VTE prophylaxis s/p L TKA  Allergies  Allergen Reactions  . Levofloxacin     REACTION: itching  . Morphine And Related     Light headed and sweaty    Patient Measurements: Height: 6' 2.02" (188 cm) Weight: 161 lb 6 oz (73.2 kg) IBW/kg (Calculated) : 82.24   Vital Signs: Temp: 98.1 F (36.7 C) (08/17 0503) BP: 133/62 mmHg (08/17 0503) Pulse Rate: 74  (08/17 0503)  Labs:  Basename 01/11/12 0437  HGB 11.2*  HCT 32.7*  PLT 152  APTT --  LABPROT 13.5  INR 1.01  HEPARINUNFRC --  CREATININE 0.65  CKTOTAL --  CKMB --  TROPONINI --    Estimated Creatinine Clearance: 96.6 ml/min (by C-G formula based on Cr of 0.65).   Medical History: Past Medical History  Diagnosis Date  . Asthma   . Degenerative disk disease   . ALLERGIC RHINITIS   . COPD (chronic obstructive pulmonary disease)   . GERD (gastroesophageal reflux disease)     Medications:  Scheduled:     . acetaminophen  1,000 mg Intravenous Q6H  . albuterol  2.5 mg Nebulization Q6H  . allopurinol  100 mg Oral Daily  . atorvastatin  10 mg Oral Daily  . azelastine  2 spray Each Nare QHS   And  . fluticasone  2 spray Each Nare QHS  .  ceFAZolin (ANCEF) IV  2 g Intravenous 60 min Pre-Op  .  ceFAZolin (ANCEF) IV  2 g Intravenous Q6H  . docusate sodium  100 mg Oral BID  . ferrous sulfate  325 mg Oral BID WC  . fluticasone  2 puff Inhalation BID  . HYDROmorphone      . isosorbide mononitrate  30 mg Oral Daily  . methocarbamol      . oxyCODONE      . pantoprazole  40 mg Oral BID WC  . predniSONE  10 mg Oral Q breakfast  . salmeterol  1 puff Inhalation Q12H  . warfarin  7.5 mg Oral ONCE-1800  . Warfarin - Pharmacist Dosing Inpatient   Does not apply q1800  . DISCONTD: albuterol  2 puff Inhalation Q6H  . DISCONTD: Azelastine-Fluticasone  2 puff Nasal QHS  . DISCONTD: povidone-iodine   Topical Once     Assessment: 66 yr old male on coumadin for VTE prophylaxis s/p L TKA (POD#1). Some bloody drainage from knee  yesterday- no further reports. INR today is 1.01 (up some). Last nights dose charted.   Goal of Therapy:  INR= 2-3 (order states to shoot for INR=2) Monitor platelets by anticoagulation protocol: Yes   Plan:  Coumadin 7.5mg  po x 1 dose again tonight. Daily PT/INR. Coumadin book and video.   Link Snuffer, PharmD, BCPS Clinical Pharmacist [phone] 01/11/2012 7:33 AM

## 2012-01-11 NOTE — Evaluation (Signed)
Physical Therapy Evaluation Patient Details Name: Levi [name] MRN: [id number] DOB: 24-Nov-1945 Today's Date: 01/11/2012 Time: 7829-5621 PT Time Calculation (min): 20 min  PT Assessment / Plan / Recommendation Clinical Impression  Pt s/p L TKA. Pt with good UE strength and RLE. Pt with decreased LLE strength, ROM as well as decreased functional mobility. Pt will benefit from skilled PT in the acute care setting in order to maximize functional mobility prior to d/c home    PT Assessment  Patient needs continued PT services    Follow Up Recommendations  Home health PT;Supervision for mobility/OOB       Equipment Recommendations  None recommended by PT       Frequency 7X/week    Precautions / Restrictions Precautions Precautions: Knee Required Braces or Orthoses: Knee Immobilizer - Left Restrictions Weight Bearing Restrictions: Yes LLE Weight Bearing: Weight bearing as tolerated         Mobility  Bed Mobility Bed Mobility: Sitting - Scoot to Edge of Bed;Supine to Sit Supine to Sit: 4: Min assist;With rails Sitting - Scoot to Edge of Bed: 6: Modified independent (Device/Increase time) Details for Bed Mobility Assistance: Min assist with weight of LLE for stability into sitting. VC for proper sequencing and safety Transfers Transfers: Sit to Stand;Stand to Sit Sit to Stand: 4: Min guard;With upper extremity assist;From bed Stand to Sit: 4: Min guard;With upper extremity assist;To chair/3-in-1 Details for Transfer Assistance: VC for proper hand placement for a safe transition. Pt with good control. Ambulation/Gait Ambulation/Gait Assistance: 4: Min guard Ambulation Distance (Feet): 200 Feet Assistive device: Rolling walker Ambulation/Gait Assistance Details: VC for proper sequencing and safety with RW. Pt able to bear little weight through the LLE although relies heavily on UEs during ambulation.  Gait Pattern: Step-to pattern;Decreased step length - right;Decreased stance  time - left;Decreased hip/knee flexion - left Gait velocity: decreased gait speed Stairs: Yes Stairs Assistance: 4: Min assist Stairs Assistance Details (indicate cue type and reason): VC for proper sequencing on stairs. 1 hand held assist on the left although pt bore little weight through the UEs Stair Management Technique: One rail Right;Other (comment);Forwards;Step to pattern (HHA left) Number of Stairs: 3     Exercises Total Joint Exercises Quad Sets: AROM;Strengthening;Left;10 reps;Supine Straight Leg Raises: AAROM;Strengthening;Left;5 reps;Supine Goniometric ROM: -5-60   PT Diagnosis: Acute pain;Difficulty walking  PT Problem List: Decreased strength;Decreased range of motion;Decreased activity tolerance;Decreased mobility;Decreased knowledge of use of DME;Decreased safety awareness;Decreased knowledge of precautions;Pain PT Treatment Interventions: DME instruction;Gait training;Stair training;Functional mobility training;Therapeutic activities;Therapeutic exercise;Patient/family education   PT Goals Acute Rehab PT Goals PT Goal Formulation: With patient Time For Goal Achievement: 01/18/12 Potential to Achieve Goals: Good Pt will go Supine/Side to Sit: with modified independence PT Goal: Supine/Side to Sit - Progress: Goal set today Pt will go Sit to Supine/Side: with modified independence PT Goal: Sit to Supine/Side - Progress: Goal set today Pt will go Sit to Stand: with modified independence PT Goal: Sit to Stand - Progress: Goal set today Pt will go Stand to Sit: with modified independence PT Goal: Stand to Sit - Progress: Goal set today Pt will Transfer Bed to Chair/Chair to Bed: with modified independence PT Transfer Goal: Bed to Chair/Chair to Bed - Progress: Goal set today Pt will Ambulate: >150 feet;with modified independence;with least restrictive assistive device PT Goal: Ambulate - Progress: Goal set today Pt will Go Up / Down Stairs: 1-2 stairs;with  supervision;with rail(s) PT Goal: Up/Down Stairs - Progress: Goal set today Pt will Perform  Home Exercise Program: Independently PT Goal: Perform Home Exercise Program - Progress: Goal set today  Visit Information  Last PT Received On: 01/11/12 Assistance Needed: +1    Subjective Data  Patient Stated Goal: to get back to running   Prior Functioning  Home Living Lives With: Spouse Available Help at Discharge: Family;Available 24 hours/day Type of Home: House Home Access: Stairs to enter [employer] of Steps: 2 Entrance Stairs-Rails: Right Home Layout: Multi-level Alternate Level Stairs-Number of Steps: 12 Alternate Level Stairs-Rails: Right Bathroom Shower/Tub: Walk-in shower;Door [employer] Toilet: Handicapped height Bathroom Accessibility: Yes How Accessible: Accessible via walker Home Adaptive Equipment: Walker - rolling;Bedside commode/3-in-1;Reacher;Sock aid;Long-handled shoehorn;Built-in shower seat Prior Function Level of Independence: Independent Able to Take Stairs?: Yes Driving: Yes Vocation: Retired Musician: No difficulties Dominant Hand: Right    Cognition  Overall Cognitive Status: Appears within functional limits for tasks assessed/performed Arousal/Alertness: Awake/alert Orientation Level: Appears intact for tasks assessed Behavior During Session: [facility] for tasks performed    Extremity/Trunk Assessment Right Lower Extremity Assessment RLE ROM/Strength/Tone: Within functional levels RLE Sensation: WFL - Light Touch RLE Coordination: WFL - gross/fine motor Left Lower Extremity Assessment LLE ROM/Strength/Tone: Deficits LLE ROM/Strength/Tone Deficits: unable to fully assess secondary to pain. Pt able to complete SLR with minimal drag without any physical assistance. Strong quad contraction. Hip and ankle WFL.  LLE Sensation: WFL - Light Touch   Balance    End of Session PT - End of Session Equipment Utilized During Treatment:  Gait belt;Left knee immobilizer Activity Tolerance: Patient tolerated treatment well Patient left: in chair;with call bell/phone within reach Nurse Communication: Mobility status CPM Left Knee CPM Left Knee: Off Left Knee Flexion (Degrees): 60  Left Knee Extension (Degrees): 0  Additional Comments: trapeze bar    Milana Kidney 01/11/2012, 10:10 AM  01/11/2012 Milana Kidney DPT PAGER: [phone] OFFICE: [phone]

## 2012-01-11 NOTE — Progress Notes (Signed)
Physical Therapy Treatment Patient Details Name: Levi [name] MRN: [id number] DOB: [birth year] Today's Date: 01/11/2012 Time: 7846-9629 PT Time Calculation (min): 13 min  PT Assessment / Plan / Recommendation Comments on Treatment Session  Patient progressing extremely well. Anticipate DC home tomorrow.     Follow Up Recommendations  Home health PT;Supervision for mobility/OOB    Barriers to Discharge        Equipment Recommendations  None recommended by PT    Recommendations for Other Services    Frequency 7X/week   Plan Discharge plan remains appropriate;Frequency remains appropriate    Precautions / Restrictions Precautions Precautions: Knee Precaution Comments: I ambulated patient without knee immobilizer as he was able to independly complete SLR with little ext lag Required Braces or Orthoses: Knee Immobilizer - Left   Pertinent Vitals/Pain     Mobility  Bed Mobility Supine to Sit: 6: Modified independent (Device/Increase time) Sitting - Scoot to Edge of Bed: 6: Modified independent (Device/Increase time) Transfers Sit to Stand: 5: Supervision;From bed;With upper extremity assist Stand to Sit: With upper extremity assist;With armrests;To chair/3-in-1;5: Supervision Details for Transfer Assistance: Cues for safe hand placement Ambulation/Gait Ambulation/Gait Assistance: 5: Supervision Ambulation Distance (Feet): 300 Feet Assistive device: Rolling walker Ambulation/Gait Assistance Details: Patient able to ambulate with proper sequence. Cues to extend fully at hips to avoid flexed posture Gait Pattern: Step-through pattern;Decreased stride length    Exercises Total Joint Exercises Quad Sets: AROM;Left;10 reps Heel Slides: AAROM;Left;10 reps Straight Leg Raises: AROM;Left;10 reps   PT Diagnosis:    PT Problem List:   PT Treatment Interventions:     PT Goals Acute Rehab PT Goals PT Goal: Supine/Side to Sit - Progress: Met PT Goal: Sit to Stand - Progress:  Progressing toward goal PT Goal: Stand to Sit - Progress: Progressing toward goal PT Transfer Goal: Bed to Chair/Chair to Bed - Progress: Progressing toward goal PT Goal: Ambulate - Progress: Progressing toward goal PT Goal: Perform Home Exercise Program - Progress: Progressing toward goal  Visit Information  Last PT Received On: 01/11/12 Assistance Needed: +1    Subjective Data      Cognition  Overall Cognitive Status: Appears within functional limits for tasks assessed/performed Arousal/Alertness: Awake/alert Orientation Level: Appears intact for tasks assessed Behavior During Session: [facility] for tasks performed    Balance     End of Session PT - End of Session Equipment Utilized During Treatment: Gait belt Activity Tolerance: Patient tolerated treatment well Patient left: in chair;with call bell/phone within reach Nurse Communication: Mobility status CPM Left Knee CPM Left Knee: On Left Knee Flexion (Degrees): 70    GP     Abdulkarim Eberlin, Adline Potter 01/11/2012, 2:31 PM 01/11/2012 Fredrich Birks PTA [phone] pager [phone] office

## 2012-01-12 LAB — PROTIME-INR
INR: 1.69 — ABNORMAL HIGH (ref 0.00–1.49)
Prothrombin Time: 20.2 s — ABNORMAL HIGH (ref 11.6–15.2)

## 2012-01-12 LAB — CBC
HCT: 31.3 % — ABNORMAL LOW (ref 39.0–52.0)
Hemoglobin: 10.8 g/dL — ABNORMAL LOW (ref 13.0–17.0)
MCH: 32.7 pg (ref 26.0–34.0)
MCHC: 34.5 g/dL (ref 30.0–36.0)
MCV: 94.8 fL (ref 78.0–100.0)
Platelets: 146 10*3/uL — ABNORMAL LOW (ref 150–400)
RBC: 3.3 MIL/uL — ABNORMAL LOW (ref 4.22–5.81)
RDW: 13.6 % (ref 11.5–15.5)
WBC: 9 10*3/uL (ref 4.0–10.5)

## 2012-01-12 MED ORDER — WARFARIN SODIUM 2 MG PO TABS
2.0000 mg | ORAL_TABLET | Freq: Once | ORAL | Status: DC
  Filled 2012-01-12: qty 1

## 2012-01-12 MED ORDER — OXYCODONE-ACETAMINOPHEN 5-325 MG PO TABS: 1.0000 | ORAL_TABLET | ORAL | Status: AC | PRN

## 2012-01-12 MED ORDER — METHOCARBAMOL 500 MG PO TABS: 500.0000 mg | ORAL_TABLET | Freq: Four times a day (QID) | ORAL | Status: AC | PRN

## 2012-01-12 MED ORDER — WARFARIN SODIUM 2 MG PO TABS: 2.0000 mg | ORAL_TABLET | Freq: Once | ORAL | Status: DC

## 2012-01-12 NOTE — Progress Notes (Signed)
PATIENT ID: Levi [name]  MRN: [id number]  DOB/AGE:  06/27/1945 / 66 y.o.  2 Days Post-Op Procedure(s) (LRB): TOTAL KNEE ARTHROPLASTY ()    PROGRESS NOTE Subjective: Patient is alert, oriented, no Nausea, no Vomiting, yes passing gas, no Bowel Movement. Taking PO well. Denies SOB, Chest or Calf Pain. Using Incentive Spirometer, PAS in place. Ambulating well with PT, CPM at 60 degrees Patient reports pain as moderate  .    Objective: Vital signs in last 24 hours: Filed Vitals:   01/11/12 2140 01/12/12 0410 01/12/12 0521 01/12/12 0917  BP: 145/69  146/71   Pulse: 69  68   Temp: 100.1 F (37.8 C) 99.2 F (37.3 C) 98.6 F (37 C)   TempSrc: Oral Oral Oral   Resp: 18  16   Height:      Weight:      SpO2: 97%  97% 98%      Intake/Output from previous day: I/O last 3 completed shifts: In: 1620 [P.O.:720; I.V.:750; IV Piggyback:150] Out: 2325 [Urine:1850; Drains:475]   Intake/Output this shift: Total I/O In: 480 [P.O.:480] Out: 800 [Urine:800]   LABORATORY DATA:  Basename 01/12/12 0657 01/11/12 0437  WBC 9.0 10.6*  HGB 10.8* 11.2*  HCT 31.3* 32.7*  PLT 146* 152  NA -- 137  K -- 3.3*  CL -- 101  CO2 -- 28  BUN -- 15  CREATININE -- 0.65  GLUCOSE -- 129*  GLUCAP -- --  INR 1.69* 1.01  CALCIUM -- 8.5    Examination: Neurologically intact ABD soft Neurovascular intact Sensation intact distally Intact pulses distally Dorsiflexion/Plantar flexion intact Incision: dressing C/D/I}  Assessment:   2 Days Post-Op Procedure(s) (LRB): TOTAL KNEE ARTHROPLASTY () ADDITIONAL DIAGNOSIS:  none  Plan: PT/OT WBAT, CPM 5/hrs day until ROM 0-90 degrees, then D/C CPM DVT Prophylaxis:  SCDx72hrs, Coumadin x 1 month DISCHARGE PLAN: Home today DISCHARGE NEEDS: HHPT, HHRN and CPM     Levi [name] M. 01/12/2012, 9:51 AM

## 2012-01-12 NOTE — Discharge Summary (Signed)
Patient ID: Levi [name] MRN: [id number] DOB/AGE: 11/05/1945 66 y.o.  Admit date: 01/10/2012 Discharge date: 01/12/2012  Admission Diagnoses:  Principal Problem:  *Osteoarthritis of left knee   Discharge Diagnoses:  Same  Past Medical History  Diagnosis Date  . Asthma   . Degenerative disk disease   . ALLERGIC RHINITIS   . COPD (chronic obstructive pulmonary disease)   . GERD (gastroesophageal reflux disease)     Surgeries: Procedure(s): TOTAL KNEE ARTHROPLASTY on 01/10/2012   Consultants:    Discharged Condition: Improved  Hospital Course: Britton Bera is an 66 y.o. male who was admitted 01/10/2012 for operative treatment ofOsteoarthritis of left knee. Patient has severe unremitting pain that affects sleep, daily activities, and work/hobbies. After pre-op clearance the patient was taken to the operating room on 01/10/2012 and underwent  Procedure(s): TOTAL KNEE ARTHROPLASTY.    Patient was given perioperative antibiotics: Anti-infectives     Start     Dose/Rate Route Frequency Ordered Stop   01/10/12 1200   ceFAZolin (ANCEF) IVPB 2 g/50 mL premix        2 g 100 mL/hr over 30 Minutes Intravenous Every 6 hours 01/10/12 1158 01/10/12 2205   01/10/12 0849   cefUROXime (ZINACEF) injection  Status:  Discontinued          As needed 01/10/12 0849 01/10/12 0959   01/09/12 1455   ceFAZolin (ANCEF) IVPB 2 g/50 mL premix        2 g 100 mL/hr over 30 Minutes Intravenous 60 min pre-op 01/09/12 1455 01/10/12 0755           Patient was given sequential compression devices, early ambulation, and chemoprophylaxis to prevent DVT.  Patient benefited maximally from hospital stay and there were no complications.    Recent vital signs: Patient Vitals for the past 24 hrs:  BP Temp Temp src Pulse Resp SpO2  01/12/12 0917 - - - - - 98 %  01/12/12 0521 146/71 mmHg 98.6 F (37 C) Oral 68  16  97 %  01/12/12 0410 - 99.2 F (37.3 C) Oral - - -  Jan 24, 2012 2140 145/69 mmHg 100.1 F (37.8 C)  Oral 69  18  97 %  01/24/12 1300 133/65 mmHg 98.3 F (36.8 C) Oral 71  18  98 %     Recent laboratory studies:  Basename 01/12/12 0657 01/24/12 0437  WBC 9.0 10.6*  HGB 10.8* 11.2*  HCT 31.3* 32.7*  PLT 146* 152  NA -- 137  K -- 3.3*  CL -- 101  CO2 -- 28  BUN -- 15  CREATININE -- 0.65  GLUCOSE -- 129*  INR 1.69* 1.01  CALCIUM -- 8.5     Discharge Medications:   Medication List  As of 01/12/2012  9:56 AM   TAKE these medications         allopurinol 100 MG tablet   Commonly known as: ZYLOPRIM   Take 100 mg by mouth daily.      atorvastatin 10 MG tablet   Commonly known as: LIPITOR   Take 10 mg by mouth daily.      Azelastine-Fluticasone 137-50 MCG/ACT Susp   Place 2 puffs into the nose at bedtime.      beclomethasone 80 MCG/ACT inhaler   Commonly known as: QVAR   Inhale 2 puffs into the lungs 2 (two) times daily.      formoterol 12 MCG capsule for inhaler   Commonly known as: FORADIL   Place 1 capsule (12 mcg total) into inhaler  and inhale 2 (two) times daily.      isosorbide mononitrate 30 MG 24 hr tablet   Commonly known as: IMDUR   Take 30 mg by mouth daily.      methocarbamol 500 MG tablet   Commonly known as: ROBAXIN   Take 1 tablet (500 mg total) by mouth every 6 (six) hours as needed.      oxyCODONE-acetaminophen 5-325 MG per tablet   Commonly known as: PERCOCET/ROXICET   Take 1-2 tablets by mouth every 4 (four) hours as needed for pain.      pantoprazole 40 MG tablet   Commonly known as: PROTONIX   Take 1 tablet (40 mg total) by mouth 2 (two) times daily.      pirbuterol 200 MCG/INH inhaler   Commonly known as: MAXAIR   Inhale 2 puffs into the lungs 4 (four) times daily as needed. For shortness of breath      predniSONE 10 MG tablet   Commonly known as: DELTASONE   Take 10 mg by mouth daily.      warfarin 2 MG tablet   Commonly known as: COUMADIN   Take 1 tablet (2 mg total) by mouth one time only at 6 PM. Take as directed by home health  nurse.            Diagnostic Studies: No results found.  Disposition: 01-Home or Self Care  Discharge Orders    Future Appointments: Provider: Department: Dept Phone: Center:   03/09/2012 9:15 AM [name], MD [facility] [phone] None   12/28/2012 9:15 AM Waymon Budge, MD [facility] [phone] None     Future Orders Please Complete By Expires   Increase activity slowly      [facility]       May shower / Bathe      Change dressing (specify)      Comments:   Dressing change as needed.   Call MD for:  temperature >100.4      Call MD for:  severe uncontrolled pain      Call MD for:  redness, tenderness, or signs of infection (pain, swelling, redness, odor or green/yellow discharge around incision site)      Discharge instructions      Comments:   F/U with Dr. Luiz Blare [phone]   Driving Restrictions      Comments:   No driving for 2 weeks.      Follow-up Information    Follow up with GRAVES,Ji L, MD. Schedule an appointment as soon as possible for a visit in 2 weeks.   Contact information:   [address] [place] [address] [phone]           Signed: Hazle Nordmann. 01/12/2012, 9:56 AM

## 2012-01-12 NOTE — Progress Notes (Signed)
D/C instructions reviewed with patient and wife. RX x 3 given. hh services arranged with [facility]. Has hh equipment at home. All questions answered. Pt d/c'ed via wheelchair in stable condition

## 2012-01-12 NOTE — Progress Notes (Signed)
Physical Therapy Treatment Patient Details Name: Levi [name] MRN: [id number] DOB: 1946/01/22 Today's Date: 01/12/2012 Time: 4098-1191 PT Time Calculation (min): 21 min  PT Assessment / Plan / Recommendation Comments on Treatment Session  Pt with increased soreness this session completing ambulation at a slower pace although still requires supervision-minguard for all mobility. Continue per plan.    Follow Up Recommendations  Home health PT;Supervision for mobility/OOB       Equipment Recommendations  None recommended by PT       Frequency 7X/week   Plan Discharge plan remains appropriate;Frequency remains appropriate    Precautions / Restrictions Precautions Precautions: Knee Restrictions Weight Bearing Restrictions: Yes LLE Weight Bearing: Weight bearing as tolerated       Mobility  Bed Mobility Bed Mobility: Supine to Sit;Sitting - Scoot to Edge of Bed;Sit to Supine Supine to Sit: 6: Modified independent (Device/Increase time) Sitting - Scoot to Edge of Bed: 6: Modified independent (Device/Increase time) Sit to Supine: 6: Modified independent (Device/Increase time) Transfers Transfers: Sit to Stand;Stand to Sit Sit to Stand: 4: Min guard;With upper extremity assist;From bed Stand to Sit: 4: Min guard;With upper extremity assist;To bed Details for Transfer Assistance: Minguard for stability as pt with increased difficulty upon standing. Complaints of soreness today Ambulation/Gait Ambulation/Gait Assistance: 5: Supervision Ambulation Distance (Feet): 400 Feet Assistive device: Rolling walker Ambulation/Gait Assistance Details: VC throughout ambulation for increased knee flexion during swing through and extension during stance phase. Pt with decreased DF as well secondary to soreness and pain(L leg stiff throughout) Gait Pattern: Step-through pattern;Decreased stride length;Decreased hip/knee flexion - left;Left flexed knee in stance Gait velocity: decreased gait speed      PT Goals Acute Rehab PT Goals PT Goal: Sit to Stand - Progress: Progressing toward goal PT Goal: Stand to Sit - Progress: Progressing toward goal PT Transfer Goal: Bed to Chair/Chair to Bed - Progress: Progressing toward goal PT Goal: Ambulate - Progress: Progressing toward goal PT Goal: Up/Down Stairs - Progress: Progressing toward goal PT Goal: Perform Home Exercise Program - Progress: Progressing toward goal  Visit Information  Last PT Received On: 01/12/12 Assistance Needed: +1       Cognition  Overall Cognitive Status: Appears within functional limits for tasks assessed/performed Arousal/Alertness: Awake/alert Orientation Level: Appears intact for tasks assessed Behavior During Session: [facility] for tasks performed       End of Session PT - End of Session Equipment Utilized During Treatment: Gait belt Activity Tolerance: Patient tolerated treatment well Patient left: in bed;with call bell/phone within reach Nurse Communication: Mobility status    Milana Kidney 01/12/2012, 8:51 AM  01/12/2012 Milana Kidney DPT PAGER: [phone] OFFICE: [phone]

## 2012-01-12 NOTE — Progress Notes (Signed)
   CARE MANAGEMENT NOTE 01/12/2012  Patient:  Levi [name], Levi [name]   Account Number:  [id number]  Date Initiated:  01/12/2012  Documentation initiated by:  [facility]  Subjective/Objective Assessment:   Osteoarthritis of left knee     Action/Plan:   lives at home with wife. HH needed   Anticipated DC Date:  01/12/2012   Anticipated DC Plan:  HOME W HOME HEALTH SERVICES      DC Planning Services  CM consult      [facility] Choice  HOME HEALTH   Choice offered to / List presented to:  C-1 Patient        HH arranged  HH-1 RN  HH-2 PT      [facility] agency  [facility]   Status of service:  Completed, signed off Medicare Important Message given?   (If response is "NO", the following Medicare IM given date fields will be blank) Date Medicare IM given:   Date Additional Medicare IM given:    Discharge Disposition:  HOME W HOME HEALTH SERVICES  Per UR Regulation:    If discussed at Long Length of Stay Meetings, dates discussed:    Comments:  01/12/2012 1130 Pt states his DME is being delivered to his home. DME agency has contacted him. Pt is requesting [facility] for HH. Referral entered in TLC and notified [facility], spoke to rep. Isidoro Donning RN CCM Case Mgmt phone [phone]

## 2012-01-12 NOTE — Progress Notes (Signed)
ANTICOAGULATION CONSULT NOTE - Follow Up Consult  Pharmacy Consult for Coumadin Indication: VTE prophylaxis s/p L TKA  Allergies  Allergen Reactions  . Levofloxacin     REACTION: itching  . Morphine And Related     Light headed and sweaty    Patient Measurements: Height: 6' 2.02" (188 cm) Weight: 161 lb 6 oz (73.2 kg) IBW/kg (Calculated) : 82.24  Vital Signs: Temp: 98.6 F (37 C) (08/18 0521) Temp src: Oral (08/18 0521) BP: 146/71 mmHg (08/18 0521) Pulse Rate: 68  (08/18 0521)  Labs:  Basename 01/12/12 0657 01/11/12 0437  HGB 10.8* 11.2*  HCT 31.3* 32.7*  PLT 146* 152  APTT -- --  LABPROT 20.2* 13.5  INR 1.69* 1.01  HEPARINUNFRC -- --  CREATININE -- 0.65  CKTOTAL -- --  CKMB -- --  TROPONINI -- --   Estimated Creatinine Clearance: 96.6 ml/min (by C-G formula based on Cr of 0.65).  Assessment: 65 YOM on Coumadin for VTE prophylaxis s/p L TKA (POD#2). INR jumped today to 1.69 after 2 dose of 7.5mg .  H/H down slightly, platelets stable. No bleeding reported. Patient is on allopurinol which can increase INR levels.   Goal of Therapy:  INR 2-3  Plan:  1. Reduce to 2mg  po x1 tonight at 1800. 2. Follow-up PT/INR in AM.  3. Coumadin education complete.   Levi [name], PharmD, BCPS Clinical Pharmacist [phone] 01/12/2012,7:53 AM

## 2012-01-13 ENCOUNTER — Encounter (HOSPITAL_COMMUNITY): Payer: Self-pay | Admitting: Orthopedic Surgery

## 2012-01-14 NOTE — Care Management Note (Signed)
    Page 1 of 2   01/14/2012     11:47:57 AM   CARE MANAGEMENT NOTE 01/14/2012  Patient:  LOIC, HOBIN   Account Number:  [id number]  Date Initiated:  01/12/2012  Documentation initiated by:  [facility]  Subjective/Objective Assessment:   Osteoarthritis of left knee     Action/Plan:   lives at home with wife. HH needed   Anticipated DC Date:  01/12/2012   Anticipated DC Plan:  HOME W HOME HEALTH SERVICES      DC Planning Services  CM consult      [facility] Choice  HOME HEALTH   Choice offered to / List presented to:  C-1 Patient        HH arranged  HH-1 RN  HH-2 PT      [facility] agency  [facility]   Status of service:  Completed, signed off Medicare Important Message given?   (If response is "NO", the following Medicare IM given date fields will be blank) Date Medicare IM given:   Date Additional Medicare IM given:    Discharge Disposition:  HOME W HOME HEALTH SERVICES  Per UR Regulation:    If discussed at Long Length of Stay Meetings, dates discussed:    Comments:  01/14/12 11:33 Letha Cape RN, BSN [phone] I received a call from patient's spouse, stating they were discharged on this past Sunday, patient had total  knee replacement and the [facility] has been out to see them but they were told that the physical therapist will not be out until Friday. Patient's wife states she called Dr. Luiz Blare office and informed them of this information.  I informed her that the [facility] was set up over the w/e with W/E NCM and if she would have known they would not be able to see patient until FRiday I am sure  she would not have set them up with [facility].  I checked with NCM and she states she was not informed that a physical therapist will not be able to see pt until Friday.  Informed patient that I will contact [facility] liason and have them to contact her as soon as possible.  I called Mary with [facility] and informed her of this information she states she will call patient's wife as soon as possible  and to make sure they get a therapist out to see patient.  01/12/2012 1130 Pt states his DME is being delivered to his home. DME agency has contacted him. Pt is requesting [facility] for HH. Referral entered in TLC and notified [facility], spoke to rep. Isidoro Donning RN CCM Case Mgmt phone [phone]

## 2012-01-17 ENCOUNTER — Ambulatory Visit (INDEPENDENT_AMBULATORY_CARE_PROVIDER_SITE_OTHER): Payer: BC Managed Care – PPO

## 2012-01-17 DIAGNOSIS — J309 Allergic rhinitis, unspecified: Secondary | ICD-10-CM

## 2012-01-28 ENCOUNTER — Ambulatory Visit (INDEPENDENT_AMBULATORY_CARE_PROVIDER_SITE_OTHER): Payer: BC Managed Care – PPO

## 2012-01-28 DIAGNOSIS — J309 Allergic rhinitis, unspecified: Secondary | ICD-10-CM

## 2012-02-06 ENCOUNTER — Ambulatory Visit (INDEPENDENT_AMBULATORY_CARE_PROVIDER_SITE_OTHER): Payer: BC Managed Care – PPO

## 2012-02-06 DIAGNOSIS — J309 Allergic rhinitis, unspecified: Secondary | ICD-10-CM

## 2012-02-06 DIAGNOSIS — Z23 Encounter for immunization: Secondary | ICD-10-CM

## 2012-02-12 ENCOUNTER — Other Ambulatory Visit (HOSPITAL_BASED_OUTPATIENT_CLINIC_OR_DEPARTMENT_OTHER): Payer: Self-pay | Admitting: Family Medicine

## 2012-02-12 DIAGNOSIS — R109 Unspecified abdominal pain: Secondary | ICD-10-CM

## 2012-02-13 ENCOUNTER — Ambulatory Visit (INDEPENDENT_AMBULATORY_CARE_PROVIDER_SITE_OTHER): Payer: BC Managed Care – PPO

## 2012-02-13 ENCOUNTER — Ambulatory Visit (HOSPITAL_BASED_OUTPATIENT_CLINIC_OR_DEPARTMENT_OTHER)
Admission: RE | Admit: 2012-02-13 | Discharge: 2012-02-13 | Disposition: A | Discharge status: Home / Self Care | Payer: BC Managed Care – PPO | Source: Ambulatory Visit | Attending: Family Medicine | Admitting: Family Medicine

## 2012-02-13 DIAGNOSIS — K5730 Diverticulosis of large intestine without perforation or abscess without bleeding: Secondary | ICD-10-CM | POA: Insufficient documentation

## 2012-02-13 DIAGNOSIS — R110 Nausea: Secondary | ICD-10-CM | POA: Insufficient documentation

## 2012-02-13 DIAGNOSIS — J309 Allergic rhinitis, unspecified: Secondary | ICD-10-CM

## 2012-02-13 DIAGNOSIS — R109 Unspecified abdominal pain: Secondary | ICD-10-CM | POA: Insufficient documentation

## 2012-02-13 MED ORDER — IOHEXOL 300 MG/ML  SOLN
100.0000 mL | Freq: Once | INTRAMUSCULAR | Status: AC | PRN
  Administered 2012-02-13: 100 mL via INTRAVENOUS

## 2012-02-20 ENCOUNTER — Ambulatory Visit (INDEPENDENT_AMBULATORY_CARE_PROVIDER_SITE_OTHER): Payer: BC Managed Care – PPO

## 2012-02-20 DIAGNOSIS — J309 Allergic rhinitis, unspecified: Secondary | ICD-10-CM

## 2012-02-25 ENCOUNTER — Ambulatory Visit (INDEPENDENT_AMBULATORY_CARE_PROVIDER_SITE_OTHER): Payer: BC Managed Care – PPO

## 2012-02-25 DIAGNOSIS — J309 Allergic rhinitis, unspecified: Secondary | ICD-10-CM

## 2012-03-03 ENCOUNTER — Ambulatory Visit (INDEPENDENT_AMBULATORY_CARE_PROVIDER_SITE_OTHER): Payer: BC Managed Care – PPO

## 2012-03-03 DIAGNOSIS — J309 Allergic rhinitis, unspecified: Secondary | ICD-10-CM

## 2012-03-09 ENCOUNTER — Ambulatory Visit (INDEPENDENT_AMBULATORY_CARE_PROVIDER_SITE_OTHER): Payer: BC Managed Care – PPO

## 2012-03-09 ENCOUNTER — Encounter: Payer: Self-pay | Admitting: Pulmonary Disease

## 2012-03-09 ENCOUNTER — Ambulatory Visit (INDEPENDENT_AMBULATORY_CARE_PROVIDER_SITE_OTHER): Payer: BC Managed Care – PPO | Admitting: Pulmonary Disease

## 2012-03-09 VITALS — BP 122/62 | HR 89 | Temp 97.6°F | Ht 74.0 in | Wt 157.8 lb

## 2012-03-09 DIAGNOSIS — J309 Allergic rhinitis, unspecified: Secondary | ICD-10-CM

## 2012-03-09 DIAGNOSIS — J45909 Unspecified asthma, uncomplicated: Secondary | ICD-10-CM

## 2012-03-09 MED ORDER — PREDNISONE 10 MG PO TABS: 10.0000 mg | ORAL_TABLET | Freq: Every day | ORAL | Status: DC

## 2012-03-09 MED ORDER — ALBUTEROL SULFATE HFA 108 (90 BASE) MCG/ACT IN AERS: 2.0000 | INHALATION_SPRAY | RESPIRATORY_TRACT | Status: DC | PRN

## 2012-03-09 NOTE — Patient Instructions (Addendum)
No change in medications Continue to stay active. followup with me in 4mos

## 2012-03-09 NOTE — Progress Notes (Signed)
  Subjective:    Patient ID: Levi [name], male    DOB: 12/04/1945, 66 y.o.   MRN: [id number]  HPI Patient comes in today for followup of his known severe asthma with allergies.  He is prednisone dependent, but has been near his usual baseline.  He had some issues with the change in seasons, but his allergies have been reasonably controlled.  His current symptoms are not far from his usual baseline.  He is not over using his rescue inhaler.   Review of Systems  Constitutional: Negative for fever and unexpected weight change.  HENT: Positive for sore throat. Negative for ear pain, nosebleeds, congestion, rhinorrhea, sneezing, trouble swallowing, dental problem, postnasal drip and sinus pressure.   Eyes: Negative for redness and itching.  Respiratory: Positive for cough, chest tightness and shortness of breath. Negative for wheezing.   Cardiovascular: Positive for chest pain. Negative for palpitations and leg swelling.  Gastrointestinal: Negative for nausea and vomiting.  Genitourinary: Negative for dysuria.  Musculoskeletal: Negative for joint swelling.  Skin: Negative for rash.  Neurological: Negative for headaches.  Hematological: Does not bruise/bleed easily.  Psychiatric/Behavioral: Negative for dysphoric mood. The patient is not nervous/anxious.        Objective:   Physical Exam Thin male in no acute distress Nose without purulence or discharge noted Chest with totally clear breath sounds, no wheezes or rhonchi Cardiac exam with regular rate and rhythm Lower extremities without edema, no cyanosis Alert and oriented, moves all 4 extremities.       Assessment & Plan:

## 2012-03-09 NOTE — Assessment & Plan Note (Signed)
The patient is near his usual baseline from an asthma standpoint.  He typically has some increased symptoms this time of year because of the change in weather, but is not requiring his rescue inhaler at this time.  He has not had a recent acute exacerbation, and his allergies are reasonably controlled.  He does go on Medicare very soon, and I wonder if we would be able to add Xolair to his regimen?

## 2012-03-10 ENCOUNTER — Encounter: Payer: Self-pay | Admitting: Internal Medicine

## 2012-03-16 ENCOUNTER — Ambulatory Visit (INDEPENDENT_AMBULATORY_CARE_PROVIDER_SITE_OTHER): Payer: BC Managed Care – PPO

## 2012-03-16 DIAGNOSIS — J309 Allergic rhinitis, unspecified: Secondary | ICD-10-CM

## 2012-03-23 ENCOUNTER — Ambulatory Visit (INDEPENDENT_AMBULATORY_CARE_PROVIDER_SITE_OTHER): Payer: BC Managed Care – PPO

## 2012-03-23 DIAGNOSIS — J309 Allergic rhinitis, unspecified: Secondary | ICD-10-CM

## 2012-03-24 ENCOUNTER — Other Ambulatory Visit: Payer: Self-pay | Admitting: Orthopedic Surgery

## 2012-03-30 ENCOUNTER — Ambulatory Visit (INDEPENDENT_AMBULATORY_CARE_PROVIDER_SITE_OTHER): Payer: Medicare Other

## 2012-03-30 DIAGNOSIS — J309 Allergic rhinitis, unspecified: Secondary | ICD-10-CM

## 2012-04-06 ENCOUNTER — Ambulatory Visit (INDEPENDENT_AMBULATORY_CARE_PROVIDER_SITE_OTHER): Payer: Medicare Other

## 2012-04-06 DIAGNOSIS — J309 Allergic rhinitis, unspecified: Secondary | ICD-10-CM | POA: Diagnosis not present

## 2012-04-13 ENCOUNTER — Ambulatory Visit (INDEPENDENT_AMBULATORY_CARE_PROVIDER_SITE_OTHER): Payer: Medicare Other

## 2012-04-13 DIAGNOSIS — J309 Allergic rhinitis, unspecified: Secondary | ICD-10-CM

## 2012-04-14 ENCOUNTER — Encounter (HOSPITAL_COMMUNITY): Payer: Self-pay | Admitting: Respiratory Therapy

## 2012-04-15 DIAGNOSIS — M75120 Complete rotator cuff tear or rupture of unspecified shoulder, not specified as traumatic: Secondary | ICD-10-CM | POA: Diagnosis not present

## 2012-04-20 ENCOUNTER — Ambulatory Visit (INDEPENDENT_AMBULATORY_CARE_PROVIDER_SITE_OTHER): Payer: Medicare Other

## 2012-04-20 DIAGNOSIS — J309 Allergic rhinitis, unspecified: Secondary | ICD-10-CM | POA: Diagnosis not present

## 2012-04-21 ENCOUNTER — Encounter (HOSPITAL_COMMUNITY)
Admission: RE | Admit: 2012-04-21 | Discharge: 2012-04-21 | Disposition: A | Discharge status: Home / Self Care | Payer: Medicare Other | Source: Ambulatory Visit | Attending: Orthopedic Surgery | Admitting: Orthopedic Surgery

## 2012-04-21 ENCOUNTER — Ambulatory Visit (HOSPITAL_COMMUNITY)
Admission: RE | Admit: 2012-04-21 | Discharge: 2012-04-21 | Disposition: A | Discharge status: Home / Self Care | Payer: Medicare Other | Source: Ambulatory Visit | Attending: Orthopedic Surgery | Admitting: Orthopedic Surgery

## 2012-04-21 ENCOUNTER — Encounter (HOSPITAL_COMMUNITY): Payer: Self-pay

## 2012-04-21 ENCOUNTER — Ambulatory Visit (INDEPENDENT_AMBULATORY_CARE_PROVIDER_SITE_OTHER): Payer: Medicare Other

## 2012-04-21 DIAGNOSIS — Z01818 Encounter for other preprocedural examination: Secondary | ICD-10-CM | POA: Diagnosis not present

## 2012-04-21 DIAGNOSIS — J309 Allergic rhinitis, unspecified: Secondary | ICD-10-CM | POA: Diagnosis not present

## 2012-04-21 HISTORY — DX: Hyperlipidemia, unspecified: E78.5

## 2012-04-21 HISTORY — DX: Pain in unspecified joint: M25.50

## 2012-04-21 HISTORY — DX: Hyperuricemia without signs of inflammatory arthritis and tophaceous disease: E79.0

## 2012-04-21 HISTORY — DX: Spontaneous ecchymoses: R23.3

## 2012-04-21 HISTORY — DX: Contact with and (suspected) exposure to other hazardous, chiefly nonmedicinal, chemicals: Z77.098

## 2012-04-21 HISTORY — DX: Effusion, unspecified joint: M25.40

## 2012-04-21 HISTORY — DX: Diverticulitis of intestine, part unspecified, without perforation or abscess without bleeding: K57.92

## 2012-04-21 HISTORY — DX: Pneumonia, unspecified organism: J18.9

## 2012-04-21 HISTORY — DX: Other skin changes: R23.8

## 2012-04-21 HISTORY — DX: Benign prostatic hyperplasia without lower urinary tract symptoms: N40.0

## 2012-04-21 LAB — PROTIME-INR
INR: 0.93 (ref 0.00–1.49)
Prothrombin Time: 12.4 seconds (ref 11.6–15.2)

## 2012-04-21 LAB — TYPE AND SCREEN
ABO/RH(D): O POS
Antibody Screen: NEGATIVE

## 2012-04-21 LAB — SURGICAL PCR SCREEN
MRSA, PCR: NEGATIVE
Staphylococcus aureus: POSITIVE — AB

## 2012-04-21 LAB — CBC WITH DIFFERENTIAL/PLATELET
Basophils Absolute: 0 10*3/uL (ref 0.0–0.1)
Basophils Relative: 1 % (ref 0–1)
Eosinophils Absolute: 0.1 10*3/uL (ref 0.0–0.7)
Eosinophils Relative: 1 % (ref 0–5)
HCT: 41.2 % (ref 39.0–52.0)
Hemoglobin: 13.6 g/dL (ref 13.0–17.0)
Lymphocytes Relative: 11 % — ABNORMAL LOW (ref 12–46)
Lymphs Abs: 0.9 10*3/uL (ref 0.7–4.0)
MCH: 29.8 pg (ref 26.0–34.0)
MCHC: 33 g/dL (ref 30.0–36.0)
MCV: 90.2 fL (ref 78.0–100.0)
Monocytes Absolute: 0.5 10*3/uL (ref 0.1–1.0)
Monocytes Relative: 6 % (ref 3–12)
Neutro Abs: 6.4 10*3/uL (ref 1.7–7.7)
Neutrophils Relative %: 81 % — ABNORMAL HIGH (ref 43–77)
Platelets: 217 10*3/uL (ref 150–400)
RBC: 4.57 MIL/uL (ref 4.22–5.81)
RDW: 14.5 % (ref 11.5–15.5)
WBC: 7.9 10*3/uL (ref 4.0–10.5)

## 2012-04-21 LAB — URINALYSIS, ROUTINE W REFLEX MICROSCOPIC
Glucose, UA: NEGATIVE mg/dL
Hgb urine dipstick: NEGATIVE
Ketones, ur: 15 mg/dL — AB
Leukocytes, UA: NEGATIVE
Nitrite: NEGATIVE
Protein, ur: NEGATIVE mg/dL
Specific Gravity, Urine: 1.03 (ref 1.005–1.030)
Urobilinogen, UA: 0.2 mg/dL (ref 0.0–1.0)
pH: 6 (ref 5.0–8.0)

## 2012-04-21 LAB — COMPREHENSIVE METABOLIC PANEL WITH GFR
ALT: 15 U/L (ref 0–53)
AST: 23 U/L (ref 0–37)
Albumin: 4.2 g/dL (ref 3.5–5.2)
Alkaline Phosphatase: 66 U/L (ref 39–117)
BUN: 25 mg/dL — ABNORMAL HIGH (ref 6–23)
CO2: 27 meq/L (ref 19–32)
Calcium: 10.4 mg/dL (ref 8.4–10.5)
Chloride: 104 meq/L (ref 96–112)
Creatinine, Ser: 1.04 mg/dL (ref 0.50–1.35)
GFR calc Af Amer: 85 mL/min — ABNORMAL LOW
GFR calc non Af Amer: 73 mL/min — ABNORMAL LOW
Glucose, Bld: 103 mg/dL — ABNORMAL HIGH (ref 70–99)
Potassium: 4.1 meq/L (ref 3.5–5.1)
Sodium: 141 meq/L (ref 135–145)
Total Bilirubin: 0.7 mg/dL (ref 0.3–1.2)
Total Protein: 7.1 g/dL (ref 6.0–8.3)

## 2012-04-21 LAB — APTT: aPTT: 27 seconds (ref 24–37)

## 2012-04-21 NOTE — Progress Notes (Signed)
Pt states he takes atacand and imdur as precaution

## 2012-04-21 NOTE — Pre-Procedure Instructions (Signed)
20 Bryam Taborda  04/21/2012   Your procedure is scheduled on:  Wed, Dec 3 @ 7:30 AM  Report to [facility] at 5:30 AM.  Call this number if you have problems the morning of surgery: [phone]   Remember:   Do not eat food:After Midnight.  Take these medicines the morning of surgery with A SIP OF WATER: Albuterol<Bring Your Inhaler With You>,Allopurinol(Zyloprim),Beclomethasone(QVAR),Formoterol(Foradil Aerolizer),Isosorbide(Imdur),Pantoprazole(Protonix),and Prednisone(Deltasone)   Do not wear jewelry.  Do not wear lotions, powders, or colognes. You may wear deodorant.  Men may shave face and neck.  Do not bring valuables to the hospital.  Contacts, dentures or bridgework may not be worn into surgery.  Leave suitcase in the car. After surgery it may be brought to your room.  For patients admitted to the hospital, checkout time is 11:00 AM the day of discharge.   Patients discharged the day of surgery will not be allowed to drive home.    Special Instructions: Shower using CHG 2 nights before surgery and the night before surgery.  If you shower the day of surgery use CHG.  Use special wash - you have one bottle of CHG for all showers.  You should use approximately 1/3 of the bottle for each shower.   Please read over the following fact sheets that you were given: Pain Booklet, Coughing and Deep Breathing, Blood Transfusion Information, MRSA Information and Surgical Site Infection Prevention

## 2012-04-21 NOTE — Progress Notes (Addendum)
Dr.Skains is cardiologist and only has to see him once a yr--last visit was Mar 2013  Stress test done in 2010-to request from Dr.Skains Heart cath in epic from 2010 Unsure if ever had an echo  MEdical MD is DR.Joycelyn Rua Dr.Clance is pulmonologist  EKG in epic from 05/16/11 CXR in epic from 05/17/11-but has had bronchitis since to repeat

## 2012-04-22 NOTE — Consult Note (Signed)
Anesthesia Chart Review:  Patient is a 66 year old male scheduled for a left shoulder reverse total shoulder arthroplasty on 04/28/12 by Dr. Ave Filter.  History includes non-smoker, HLD, GERD, BPH, DDD, COPD, asthma (steroid dependent), PNA, agent orange exposure, diverticulitis, multiple prior surgeries including back and joint surgeries.  Patient is s/p a left TKA on 01/10/12.   He reports an anesthesia history of being slow to wake up. PCP is Dr. Joycelyn Rua. Pulmonologist is Dr. Shelle Iron, last visit 03/09/12.  Cardiologist is Dr. Anne Fu, last visit 08/22/11.  EKG on 08/22/11 showed SB @ 55 bpm.  Cardiac cath on 11/25/08 showed (done due to abnormal stress test on 11/22/08 that showed mild inferior ischemia, transient ischemic dilatation possibly indications 3V CAD,  EF 43%): 1. Calcified ostial left anterior descending lesion of up to 20% stenosis, which is not flow limiting, otherwise no angiographically significant coronary artery disease.  2. Left ventricular ejection fraction of approximately 50% with mildly dilated left ventricle and apical hypokinesis noted.  3. No abdominal aortic aneurysm. No renal artery stenosis.   CXR on 04/21/12 showed mild hyperinflation, no active disease.  Preoperative labs noted.  He tolerated TKA in August.  If no significant change in his status then anticipate he can proceed as planned.  Shonna Chock, PA-C

## 2012-04-27 ENCOUNTER — Ambulatory Visit (INDEPENDENT_AMBULATORY_CARE_PROVIDER_SITE_OTHER): Payer: Medicare Other

## 2012-04-27 DIAGNOSIS — J309 Allergic rhinitis, unspecified: Secondary | ICD-10-CM

## 2012-04-27 MED ORDER — CEFAZOLIN SODIUM-DEXTROSE 2-3 GM-% IV SOLR
2.0000 g | INTRAVENOUS | Status: AC
  Administered 2012-04-28: 2 g via INTRAVENOUS
  Filled 2012-04-27: qty 50

## 2012-04-28 ENCOUNTER — Encounter (HOSPITAL_COMMUNITY): Payer: Self-pay | Admitting: *Deleted

## 2012-04-28 ENCOUNTER — Encounter (HOSPITAL_COMMUNITY): Payer: Self-pay | Admitting: Vascular Surgery

## 2012-04-28 ENCOUNTER — Inpatient Hospital Stay (HOSPITAL_COMMUNITY): Payer: Medicare Other

## 2012-04-28 ENCOUNTER — Inpatient Hospital Stay (HOSPITAL_COMMUNITY): Payer: Medicare Other | Admitting: Vascular Surgery

## 2012-04-28 ENCOUNTER — Inpatient Hospital Stay (HOSPITAL_COMMUNITY)
Admission: RE | Admit: 2012-04-28 | Discharge: 2012-04-29 | DRG: 484 | Disposition: A | Discharge status: Home / Self Care | Payer: Medicare Other | Source: Ambulatory Visit | Attending: Orthopedic Surgery | Admitting: Orthopedic Surgery

## 2012-04-28 ENCOUNTER — Encounter (HOSPITAL_COMMUNITY)
Admission: RE | Disposition: A | Discharge status: Home / Self Care | Payer: Self-pay | Source: Ambulatory Visit | Attending: Orthopedic Surgery

## 2012-04-28 DIAGNOSIS — Z96649 Presence of unspecified artificial hip joint: Secondary | ICD-10-CM

## 2012-04-28 DIAGNOSIS — M19019 Primary osteoarthritis, unspecified shoulder: Secondary | ICD-10-CM | POA: Diagnosis not present

## 2012-04-28 DIAGNOSIS — Z96619 Presence of unspecified artificial shoulder joint: Secondary | ICD-10-CM | POA: Diagnosis not present

## 2012-04-28 DIAGNOSIS — Z96659 Presence of unspecified artificial knee joint: Secondary | ICD-10-CM | POA: Diagnosis not present

## 2012-04-28 DIAGNOSIS — J4489 Other specified chronic obstructive pulmonary disease: Secondary | ICD-10-CM | POA: Diagnosis present

## 2012-04-28 DIAGNOSIS — Z8249 Family history of ischemic heart disease and other diseases of the circulatory system: Secondary | ICD-10-CM

## 2012-04-28 DIAGNOSIS — J449 Chronic obstructive pulmonary disease, unspecified: Secondary | ICD-10-CM | POA: Diagnosis present

## 2012-04-28 DIAGNOSIS — E785 Hyperlipidemia, unspecified: Secondary | ICD-10-CM | POA: Diagnosis not present

## 2012-04-28 DIAGNOSIS — Z881 Allergy status to other antibiotic agents status: Secondary | ICD-10-CM

## 2012-04-28 DIAGNOSIS — Z888 Allergy status to other drugs, medicaments and biological substances status: Secondary | ICD-10-CM

## 2012-04-28 DIAGNOSIS — M25519 Pain in unspecified shoulder: Secondary | ICD-10-CM | POA: Diagnosis not present

## 2012-04-28 DIAGNOSIS — M12519 Traumatic arthropathy, unspecified shoulder: Secondary | ICD-10-CM | POA: Diagnosis not present

## 2012-04-28 DIAGNOSIS — M719 Bursopathy, unspecified: Principal | ICD-10-CM | POA: Diagnosis present

## 2012-04-28 DIAGNOSIS — K219 Gastro-esophageal reflux disease without esophagitis: Secondary | ICD-10-CM | POA: Diagnosis present

## 2012-04-28 DIAGNOSIS — M67919 Unspecified disorder of synovium and tendon, unspecified shoulder: Secondary | ICD-10-CM | POA: Diagnosis not present

## 2012-04-28 DIAGNOSIS — G8918 Other acute postprocedural pain: Secondary | ICD-10-CM | POA: Diagnosis not present

## 2012-04-28 HISTORY — DX: Unspecified chronic bronchitis: J42

## 2012-04-28 HISTORY — PX: REVERSE SHOULDER ARTHROPLASTY: SHX5054

## 2012-04-28 HISTORY — DX: Personal history of other diseases of the digestive system: Z87.19

## 2012-04-28 SURGERY — ARTHROPLASTY, SHOULDER, TOTAL, REVERSE
Anesthesia: General | Site: Shoulder | Laterality: Left | Wound class: Clean

## 2012-04-28 MED ORDER — ARTIFICIAL TEARS OP OINT
TOPICAL_OINTMENT | OPHTHALMIC | Status: DC | PRN
  Administered 2012-04-28: 1 via OPHTHALMIC

## 2012-04-28 MED ORDER — LACTATED RINGERS IV SOLN
INTRAVENOUS | Status: DC | PRN
  Administered 2012-04-28 (×3): via INTRAVENOUS

## 2012-04-28 MED ORDER — PREDNISONE 10 MG PO TABS
10.0000 mg | ORAL_TABLET | Freq: Every day | ORAL | Status: DC
  Administered 2012-04-29: 10 mg via ORAL
  Filled 2012-04-28: qty 1

## 2012-04-28 MED ORDER — PHENOL 1.4 % MT LIQD: 1.0000 | OROMUCOSAL | Status: DC | PRN

## 2012-04-28 MED ORDER — METOCLOPRAMIDE HCL 10 MG PO TABS: 5.0000 mg | ORAL_TABLET | Freq: Three times a day (TID) | ORAL | Status: DC | PRN

## 2012-04-28 MED ORDER — PROPOFOL 10 MG/ML IV BOLUS
INTRAVENOUS | Status: DC | PRN
  Administered 2012-04-28: 200 mg via INTRAVENOUS

## 2012-04-28 MED ORDER — ALUM & MAG HYDROXIDE-SIMETH 200-200-20 MG/5ML PO SUSP: 30.0000 mL | ORAL | Status: DC | PRN

## 2012-04-28 MED ORDER — METOCLOPRAMIDE HCL 5 MG/ML IJ SOLN: 5.0000 mg | Freq: Three times a day (TID) | INTRAMUSCULAR | Status: DC | PRN

## 2012-04-28 MED ORDER — MIDAZOLAM HCL 5 MG/5ML IJ SOLN
INTRAMUSCULAR | Status: DC | PRN
  Administered 2012-04-28 (×2): 0.5 mg via INTRAVENOUS

## 2012-04-28 MED ORDER — ZOLPIDEM TARTRATE 5 MG PO TABS: 5.0000 mg | ORAL_TABLET | Freq: Every evening | ORAL | Status: DC | PRN

## 2012-04-28 MED ORDER — ALLOPURINOL 100 MG PO TABS
100.0000 mg | ORAL_TABLET | Freq: Every day | ORAL | Status: DC
  Administered 2012-04-29: 100 mg via ORAL
  Filled 2012-04-28: qty 1

## 2012-04-28 MED ORDER — ATORVASTATIN CALCIUM 10 MG PO TABS
10.0000 mg | ORAL_TABLET | Freq: Every day | ORAL | Status: DC
  Administered 2012-04-29: 10 mg via ORAL
  Filled 2012-04-28: qty 1

## 2012-04-28 MED ORDER — FLUTICASONE PROPIONATE HFA 44 MCG/ACT IN AERO
2.0000 | INHALATION_SPRAY | Freq: Two times a day (BID) | RESPIRATORY_TRACT | Status: DC
  Administered 2012-04-28 – 2012-04-29 (×2): 2 via RESPIRATORY_TRACT
  Filled 2012-04-28: qty 10.6

## 2012-04-28 MED ORDER — SODIUM CHLORIDE 0.9 % IR SOLN
Status: DC | PRN
  Administered 2012-04-28: 3000 mL

## 2012-04-28 MED ORDER — OXYCODONE-ACETAMINOPHEN 5-325 MG PO TABS
1.0000 | ORAL_TABLET | ORAL | Status: DC | PRN
  Administered 2012-04-28 (×2): 2 via ORAL
  Administered 2012-04-28: 1 via ORAL
  Filled 2012-04-28 (×2): qty 2

## 2012-04-28 MED ORDER — HYDROMORPHONE HCL PF 1 MG/ML IJ SOLN
0.5000 mg | INTRAMUSCULAR | Status: DC | PRN
  Administered 2012-04-28 – 2012-04-29 (×7): 1 mg via INTRAVENOUS
  Filled 2012-04-28 (×6): qty 1

## 2012-04-28 MED ORDER — OXYCODONE-ACETAMINOPHEN 5-325 MG PO TABS
ORAL_TABLET | ORAL | Status: AC
  Filled 2012-04-28: qty 2

## 2012-04-28 MED ORDER — BISACODYL 5 MG PO TBEC: 5.0000 mg | DELAYED_RELEASE_TABLET | Freq: Every day | ORAL | Status: DC | PRN

## 2012-04-28 MED ORDER — DOCUSATE SODIUM 100 MG PO CAPS
100.0000 mg | ORAL_CAPSULE | Freq: Two times a day (BID) | ORAL | Status: DC
  Administered 2012-04-28 – 2012-04-29 (×3): 100 mg via ORAL
  Filled 2012-04-28 (×4): qty 1

## 2012-04-28 MED ORDER — ISOSORBIDE MONONITRATE ER 30 MG PO TB24
30.0000 mg | ORAL_TABLET | Freq: Every day | ORAL | Status: DC
  Administered 2012-04-29: 30 mg via ORAL
  Filled 2012-04-28: qty 1

## 2012-04-28 MED ORDER — LIDOCAINE HCL (CARDIAC) 20 MG/ML IV SOLN
INTRAVENOUS | Status: DC | PRN
  Administered 2012-04-28: 50 mg via INTRAVENOUS

## 2012-04-28 MED ORDER — BUPIVACAINE-EPINEPHRINE PF 0.5-1:200000 % IJ SOLN
INTRAMUSCULAR | Status: DC | PRN
  Administered 2012-04-28: 20 mL

## 2012-04-28 MED ORDER — FLEET ENEMA 7-19 GM/118ML RE ENEM: 1.0000 | ENEMA | Freq: Once | RECTAL | Status: AC | PRN

## 2012-04-28 MED ORDER — HYDROMORPHONE HCL PF 1 MG/ML IJ SOLN
INTRAMUSCULAR | Status: AC
  Filled 2012-04-28: qty 1

## 2012-04-28 MED ORDER — CEFAZOLIN SODIUM 1-5 GM-% IV SOLN
1.0000 g | Freq: Four times a day (QID) | INTRAVENOUS | Status: AC
  Administered 2012-04-28 – 2012-04-29 (×3): 1 g via INTRAVENOUS
  Filled 2012-04-28 (×3): qty 50

## 2012-04-28 MED ORDER — POVIDONE-IODINE 7.5 % EX SOLN: Freq: Once | CUTANEOUS | Status: DC

## 2012-04-28 MED ORDER — HYDROMORPHONE HCL PF 1 MG/ML IJ SOLN
0.2500 mg | INTRAMUSCULAR | Status: DC | PRN
  Administered 2012-04-28: 0.25 mg via INTRAVENOUS

## 2012-04-28 MED ORDER — ONDANSETRON HCL 4 MG PO TABS: 4.0000 mg | ORAL_TABLET | Freq: Four times a day (QID) | ORAL | Status: DC | PRN

## 2012-04-28 MED ORDER — ONDANSETRON HCL 4 MG/2ML IJ SOLN
INTRAMUSCULAR | Status: DC | PRN
  Administered 2012-04-28: 4 mg via INTRAVENOUS

## 2012-04-28 MED ORDER — ONDANSETRON HCL 4 MG/2ML IJ SOLN: 4.0000 mg | Freq: Four times a day (QID) | INTRAMUSCULAR | Status: DC | PRN

## 2012-04-28 MED ORDER — ALBUTEROL SULFATE HFA 108 (90 BASE) MCG/ACT IN AERS
2.0000 | INHALATION_SPRAY | RESPIRATORY_TRACT | Status: DC | PRN
  Administered 2012-04-28: 2 via RESPIRATORY_TRACT

## 2012-04-28 MED ORDER — ACETAMINOPHEN 325 MG PO TABS: 650.0000 mg | ORAL_TABLET | Freq: Four times a day (QID) | ORAL | Status: DC | PRN

## 2012-04-28 MED ORDER — BUPIVACAINE-EPINEPHRINE PF 0.25-1:200000 % IJ SOLN
INTRAMUSCULAR | Status: AC
  Filled 2012-04-28: qty 30

## 2012-04-28 MED ORDER — HYDROMORPHONE HCL PF 1 MG/ML IJ SOLN
INTRAMUSCULAR | Status: AC
  Administered 2012-04-28: 0.25 mg via INTRAVENOUS
  Filled 2012-04-28: qty 1

## 2012-04-28 MED ORDER — PHENYLEPHRINE HCL 10 MG/ML IJ SOLN
10.0000 mg | INTRAVENOUS | Status: DC | PRN
  Administered 2012-04-28: 10 ug/min via INTRAVENOUS

## 2012-04-28 MED ORDER — OXYCODONE-ACETAMINOPHEN 5-325 MG PO TABS: 1.0000 | ORAL_TABLET | ORAL | Status: DC | PRN

## 2012-04-28 MED ORDER — FENTANYL CITRATE 0.05 MG/ML IJ SOLN
INTRAMUSCULAR | Status: DC | PRN
  Administered 2012-04-28: 50 ug via INTRAVENOUS
  Administered 2012-04-28: 100 ug via INTRAVENOUS
  Administered 2012-04-28: 50 ug via INTRAVENOUS

## 2012-04-28 MED ORDER — SALMETEROL XINAFOATE 50 MCG/DOSE IN AEPB
1.0000 | INHALATION_SPRAY | Freq: Two times a day (BID) | RESPIRATORY_TRACT | Status: DC
  Administered 2012-04-28 – 2012-04-29 (×2): 1 via RESPIRATORY_TRACT
  Filled 2012-04-28: qty 0

## 2012-04-28 MED ORDER — SODIUM CHLORIDE 0.9 % IV SOLN
INTRAVENOUS | Status: DC
  Administered 2012-04-28: 18:00:00 via INTRAVENOUS

## 2012-04-28 MED ORDER — DIPHENHYDRAMINE HCL 12.5 MG/5ML PO ELIX: 12.5000 mg | ORAL_SOLUTION | ORAL | Status: DC | PRN

## 2012-04-28 MED ORDER — ACETAMINOPHEN 650 MG RE SUPP: 650.0000 mg | Freq: Four times a day (QID) | RECTAL | Status: DC | PRN

## 2012-04-28 MED ORDER — GLYCOPYRROLATE 0.2 MG/ML IJ SOLN
INTRAMUSCULAR | Status: DC | PRN
  Administered 2012-04-28 (×2): 0.2 mg via INTRAVENOUS
  Administered 2012-04-28: 0.4 mg via INTRAVENOUS

## 2012-04-28 MED ORDER — PANTOPRAZOLE SODIUM 40 MG PO TBEC
40.0000 mg | DELAYED_RELEASE_TABLET | Freq: Two times a day (BID) | ORAL | Status: DC
  Administered 2012-04-28 – 2012-04-29 (×2): 40 mg via ORAL
  Filled 2012-04-28 (×2): qty 1

## 2012-04-28 MED ORDER — HYDROCODONE-ACETAMINOPHEN 5-325 MG PO TABS: 1.0000 | ORAL_TABLET | ORAL | Status: DC | PRN

## 2012-04-28 MED ORDER — POLYETHYLENE GLYCOL 3350 17 G PO PACK: 17.0000 g | PACK | Freq: Every day | ORAL | Status: DC | PRN

## 2012-04-28 MED ORDER — EPHEDRINE SULFATE 50 MG/ML IJ SOLN
INTRAMUSCULAR | Status: DC | PRN
  Administered 2012-04-28: 10 mg via INTRAVENOUS
  Administered 2012-04-28: 5 mg via INTRAVENOUS

## 2012-04-28 MED ORDER — ONDANSETRON HCL 4 MG/2ML IJ SOLN: 4.0000 mg | Freq: Once | INTRAMUSCULAR | Status: DC | PRN

## 2012-04-28 MED ORDER — ROCURONIUM BROMIDE 100 MG/10ML IV SOLN
INTRAVENOUS | Status: DC | PRN
  Administered 2012-04-28: 10 mg via INTRAVENOUS
  Administered 2012-04-28: 50 mg via INTRAVENOUS
  Administered 2012-04-28: 10 mg via INTRAVENOUS

## 2012-04-28 MED ORDER — DEXTROSE 5 % IV SOLN
INTRAVENOUS | Status: DC | PRN
  Administered 2012-04-28: 08:00:00 via INTRAVENOUS

## 2012-04-28 MED ORDER — NEOSTIGMINE METHYLSULFATE 1 MG/ML IJ SOLN
INTRAMUSCULAR | Status: DC | PRN
  Administered 2012-04-28: 3 mg via INTRAVENOUS

## 2012-04-28 MED ORDER — MENTHOL 3 MG MT LOZG: 1.0000 | LOZENGE | OROMUCOSAL | Status: DC | PRN

## 2012-04-28 SURGICAL SUPPLY — 71 items
BLADE SAW SAG 73X25 THK (BLADE) ×1
BLADE SAW SGTL 73X25 THK (BLADE) ×1 IMPLANT
BOWL SMART MIX CTS (DISPOSABLE) IMPLANT
CHLORAPREP W/TINT 26ML (MISCELLANEOUS) ×2 IMPLANT
CLOTH BEACON ORANGE TIMEOUT ST (SAFETY) ×2 IMPLANT
COVER SURGICAL LIGHT HANDLE (MISCELLANEOUS) ×2 IMPLANT
DRAPE INCISE IOBAN 66X45 STRL (DRAPES) ×2 IMPLANT
DRAPE SURG 17X23 STRL (DRAPES) ×2 IMPLANT
DRAPE U-SHAPE 47X51 STRL (DRAPES) ×2 IMPLANT
DRSG ADAPTIC 3X8 NADH LF (GAUZE/BANDAGES/DRESSINGS) ×2 IMPLANT
DRSG MEPILEX BORDER 4X4 (GAUZE/BANDAGES/DRESSINGS) ×1 IMPLANT
DRSG MEPILEX BORDER 4X8 (GAUZE/BANDAGES/DRESSINGS) ×1 IMPLANT
DRSG PAD ABDOMINAL 8X10 ST (GAUZE/BANDAGES/DRESSINGS) ×4 IMPLANT
ELECT BLADE 4.0 EZ CLEAN MEGAD (MISCELLANEOUS)
ELECT REM PT RETURN 9FT ADLT (ELECTROSURGICAL) ×2
ELECTRODE BLDE 4.0 EZ CLN MEGD (MISCELLANEOUS) IMPLANT
ELECTRODE REM PT RTRN 9FT ADLT (ELECTROSURGICAL) ×1 IMPLANT
EVACUATOR 1/8 PVC DRAIN (DRAIN) ×2 IMPLANT
GLOVE BIO SURGEON STRL SZ7 (GLOVE) ×2 IMPLANT
GLOVE BIO SURGEON STRL SZ7.5 (GLOVE) ×2 IMPLANT
GLOVE BIOGEL PI IND STRL 7.5 (GLOVE) ×1 IMPLANT
GLOVE BIOGEL PI IND STRL 8 (GLOVE) ×1 IMPLANT
GLOVE BIOGEL PI INDICATOR 7.5 (GLOVE) ×1
GLOVE BIOGEL PI INDICATOR 8 (GLOVE) ×1
GOWN PREVENTION PLUS LG XLONG (DISPOSABLE) ×2 IMPLANT
GOWN PREVENTION PLUS XLARGE (GOWN DISPOSABLE) ×2 IMPLANT
GOWN STRL NON-REIN LRG LVL3 (GOWN DISPOSABLE) ×4 IMPLANT
HANDPIECE INTERPULSE COAX TIP (DISPOSABLE)
HEMOSTAT SURGICEL 2X14 (HEMOSTASIS) IMPLANT
HOOD PEEL AWAY FACE SHEILD DIS (HOOD) ×4 IMPLANT
KIT BASIN OR (CUSTOM PROCEDURE TRAY) ×2 IMPLANT
KIT ROOM TURNOVER OR (KITS) ×2 IMPLANT
MANIFOLD NEPTUNE II (INSTRUMENTS) ×2 IMPLANT
NDL HYPO 25GX1X1/2 BEV (NEEDLE) ×1 IMPLANT
NDL MAYO TROCAR (NEEDLE) ×1 IMPLANT
NEEDLE HYPO 25GX1X1/2 BEV (NEEDLE) ×2 IMPLANT
NEEDLE MAYO TROCAR (NEEDLE) ×2 IMPLANT
NOZZLE PRISM 8.5MM (MISCELLANEOUS) IMPLANT
NS IRRIG 1000ML POUR BTL (IV SOLUTION) ×2 IMPLANT
PACK SHOULDER (CUSTOM PROCEDURE TRAY) ×2 IMPLANT
PAD ARMBOARD 7.5X6 YLW CONV (MISCELLANEOUS) ×4 IMPLANT
PIN METAGLENE 2.5 (PIN) ×1 IMPLANT
RETRIEVER SUT HEWSON (MISCELLANEOUS) IMPLANT
SET HNDPC FAN SPRY TIP SCT (DISPOSABLE) IMPLANT
SLING ARM IMMOBILIZER LRG (SOFTGOODS) ×2 IMPLANT
SLING ARM IMMOBILIZER MED (SOFTGOODS) IMPLANT
SPONGE GAUZE 4X4 12PLY (GAUZE/BANDAGES/DRESSINGS) ×2 IMPLANT
SPONGE LAP 18X18 X RAY DECT (DISPOSABLE) ×2 IMPLANT
SPONGE LAP 4X18 X RAY DECT (DISPOSABLE) ×4 IMPLANT
STRIP CLOSURE SKIN 1/2X4 (GAUZE/BANDAGES/DRESSINGS) ×2 IMPLANT
SUCTION FRAZIER TIP 10 FR DISP (SUCTIONS) ×2 IMPLANT
SUPPORT WRAP ARM LG (MISCELLANEOUS) ×2 IMPLANT
SUT BONE WAX W31G (SUTURE) ×1 IMPLANT
SUT ETHIBOND 2 OS 4 DA (SUTURE) ×4 IMPLANT
SUT ETHIBOND NAB CT1 #1 30IN (SUTURE) ×2 IMPLANT
SUT FIBERWIRE #2 38 T-5 BLUE (SUTURE)
SUT MNCRL AB 4-0 PS2 18 (SUTURE) ×2 IMPLANT
SUT SILK 2 0 TIES 17X18 (SUTURE) ×2
SUT SILK 2-0 18XBRD TIE BLK (SUTURE) ×1 IMPLANT
SUT VIC AB 0 CTB1 27 (SUTURE) ×2 IMPLANT
SUT VIC AB 2-0 CT1 27 (SUTURE) ×4
SUT VIC AB 2-0 CT1 TAPERPNT 27 (SUTURE) ×2 IMPLANT
SUTURE FIBERWR #2 38 T-5 BLUE (SUTURE) IMPLANT
SYR CONTROL 10ML LL (SYRINGE) ×2 IMPLANT
SYRINGE TOOMEY DISP (SYRINGE) IMPLANT
TAPE STRIPS DRAPE STRL (GAUZE/BANDAGES/DRESSINGS) ×1 IMPLANT
TOWEL OR 17X24 6PK STRL BLUE (TOWEL DISPOSABLE) ×2 IMPLANT
TOWEL OR 17X26 10 PK STRL BLUE (TOWEL DISPOSABLE) ×2 IMPLANT
TRAY FOLEY CATH 14FR (SET/KITS/TRAYS/PACK) IMPLANT
WATER STERILE IRR 1000ML POUR (IV SOLUTION) ×2 IMPLANT
YANKAUER SUCT BULB TIP NO VENT (SUCTIONS) ×2 IMPLANT

## 2012-04-28 NOTE — H&P (Signed)
Levi [name] is an 66 y.o. male.   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 Asthma   . Degenerative disk disease   . ALLERGIC RHINITIS   . COPD (chronic obstructive pulmonary disease)   . Complication of anesthesia     slow to wake up  . Hyperlipidemia     takes Lipitor daily  . Agent orange exposure   . Shortness of breath     lying/sitting/standing  . Pneumonia     hx of--last time about a yr ago  . History of bronchitis     last time about couple months ago  . Joint pain   . Joint swelling   . Back pain   . Elevated uric acid in blood     takes Allopurinol daily  . Bruises easily   . GERD (gastroesophageal reflux disease)     takes Protonix daily  . Diverticulitis   . Enlarged prostate     Past Surgical History  Procedure Date  . Knee surgery   . Gallbladder surgery   . Cataract extraction   . Total hip arthroplasty   . Back surgery   . Cholecystectomy   . Joint replacement   . Eye surgery   . Tonsillectomy   . Total knee arthroplasty 01/10/2012    Procedure: TOTAL KNEE ARTHROPLASTY;  Surgeon: Harvie Junior, MD;  Location: [facility] OR;  Service: Orthopedics;;  left total knee arthroplasty  . Cardiac catheterization 11/2008    Dr. Chales Abrahams  . Knee arthroscopy     x 3  . Colonoscopy   . Esophagogastroduodenoscopy     Family History  Problem Relation Age of Onset  . Heart disease Father   . Prostate cancer Brother    Social History:  reports that he has never smoked. He has never used smokeless tobacco. He reports that he does not drink alcohol or use illicit drugs.  Allergies:  Allergies  Allergen Reactions  . Levofloxacin     REACTION: itching  . Morphine And Related     Light headed and sweaty    Medications Prior to Admission  Medication Sig Dispense Refill  . albuterol (PROAIR HFA) 108 (90 BASE) MCG/ACT inhaler Inhale 2 puffs into the lungs every 4  (four) hours as needed for wheezing.  3 Inhaler  3  . allopurinol (ZYLOPRIM) 100 MG tablet Take 100 mg by mouth daily.        [name] atorvastatin (LIPITOR) 10 MG tablet Take 10 mg by mouth daily.       . beclomethasone (QVAR) 80 MCG/ACT inhaler Inhale 2 puffs into the lungs 2 (two) times daily.  1 Inhaler  11  . candesartan (ATACAND) 4 MG tablet Take 4 mg by mouth daily.      . formoterol (FORADIL AEROLIZER) 12 MCG capsule for inhaler Place 1 capsule (12 mcg total) into inhaler and inhale 2 (two) times daily.  60 capsule  11  . isosorbide mononitrate (IMDUR) 30 MG 24 hr tablet Take 30 mg by mouth daily.       . pantoprazole (PROTONIX) 40 MG tablet Take 1 tablet (40 mg total) by mouth 2 (two) times daily.  60 tablet  11  . predniSONE (DELTASONE) 10 MG tablet Take 1 tablet (10 mg total) by mouth daily.  90 tablet  3    No results found for this or any previous visit (from the past 48  hour(s)). No results found.  Review of Systems  All other systems reviewed and are negative.    Blood pressure 123/69, pulse 57, temperature 98 F (36.7 C), temperature source Oral, resp. rate 18, SpO2 100.00%. Physical Exam  Constitutional: He is oriented to person, place, and time. He appears well-developed and well-nourished.  HENT:  Head: Atraumatic.  Eyes: EOM are normal.  Cardiovascular: Intact distal pulses.   Respiratory: Effort normal.  Musculoskeletal:       Left shoulder: He exhibits decreased range of motion and tenderness.  Neurological: He is alert and oriented to person, place, and time.  Skin: Skin is warm and dry.  Psychiatric: He has a normal mood and affect.     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

## 2012-04-28 NOTE — Anesthesia Preprocedure Evaluation (Addendum)
Anesthesia Evaluation  Patient identified by MRN, date of birth, ID band Patient awake    Reviewed: Allergy & Precautions, H&P , NPO status , Patient's Chart, lab work & pertinent test results  Airway Mallampati: I TM Distance: >3 FB Neck ROM: full    Dental   Pulmonary shortness of breath and with exertion, asthma , pneumonia -, resolved, COPD COPD inhaler,          Cardiovascular negative cardio ROS  Rhythm:regular Rate:Normal     Neuro/Psych negative neurological ROS  negative psych ROS   GI/Hepatic Neg liver ROS, GERD-  Medicated and Controlled,  Endo/Other  negative endocrine ROS  Renal/GU negative Renal ROS     Musculoskeletal  (+) Arthritis -, Osteoarthritis,    Abdominal   Peds  Hematology negative hematology ROS (+)   Anesthesia Other Findings   Reproductive/Obstetrics                          Anesthesia Physical Anesthesia Plan  ASA: III  Anesthesia Plan: General   Post-op Pain Management:    Induction: Intravenous  Airway Management Planned: Oral ETT  Additional Equipment:   Intra-op Plan:   Post-operative Plan: Extubation in OR  Informed Consent: I have reviewed the patients History and Physical, chart, labs and discussed the procedure including the risks, benefits and alternatives for the proposed anesthesia with the patient or authorized representative who has indicated his/her understanding and acceptance.   Dental advisory given  Plan Discussed with: Anesthesiologist, Surgeon and CRNA  Anesthesia Plan Comments:        Anesthesia Quick Evaluation

## 2012-04-28 NOTE — Anesthesia Postprocedure Evaluation (Signed)
  Anesthesia Post-op Note  Patient: Levi [name]  Procedure(s) Performed: Procedure(s) (LRB) with comments: REVERSE SHOULDER ARTHROPLASTY (Left) - Left shouder reverse total shoulder arthroplasty  Patient Location: PACU  Anesthesia Type:GA combined with regional for post-op pain  Level of Consciousness: awake, alert , oriented and patient cooperative  Airway and Oxygen Therapy: Patient Spontanous Breathing  Post-op Pain: mild  Post-op Assessment: Post-op Vital signs reviewed, Patient's Cardiovascular Status Stable, Respiratory Function Stable, Patent Airway, No signs of Nausea or vomiting and Pain level controlled  Post-op Vital Signs: stable  Complications: No apparent anesthesia complications

## 2012-04-28 NOTE — Progress Notes (Signed)
Rec'd report from [facility], assuming care of patient at this time

## 2012-04-28 NOTE — Plan of Care (Signed)
Problem: Consults Goal: Diagnosis- Total Joint Replacement Left Reverse total shoulder

## 2012-04-28 NOTE — Anesthesia Procedure Notes (Addendum)
Anesthesia Regional Block:  Interscalene brachial plexus block  Pre-Anesthetic Checklist: ,, timeout performed, Correct Patient, Correct Site, Correct Laterality, Correct Procedure, Correct Position, site marked, Risks and benefits discussed,  Surgical consent,  Pre-op evaluation,  At surgeon's request and post-op pain management  Laterality: Left  Prep: Maximum Sterile Barrier Precautions used, chloraprep and alcohol swabs       Needles:   Needle Type: Stimulator Needle - 40        Needle insertion depth: 4 cm   Additional Needles:  Procedures: nerve stimulator Interscalene brachial plexus block  Nerve Stimulator or Paresthesia:  Response: 0.5 mA, 0.1 ms, 4 cm  Additional Responses:   Narrative:  Start time: 04/28/2012 7:15 AM End time: 04/28/2012 7:26 AM Injection made incrementally with aspirations every 5 mL.  Performed by: Personally  Anesthesiologist: Maren Beach MD  Additional Notes: Pt accepts procedure and risks. 20cc 0.5% Marcaine w/ epi w/o difficulty or discomfort. Pt tolerated well.    Feliberto Gottron MD     Procedure Name: Intubation Date/Time: 04/28/2012 8:04 AM Performed by: Windle Guard Pre-anesthesia Checklist: Emergency Drugs available, Patient identified, Timeout performed, Suction available and Patient being monitored Patient Re-evaluated:Patient Re-evaluated prior to inductionOxygen Delivery Method: Circle system utilized Preoxygenation: Pre-oxygenation with 100% oxygen Intubation Type: IV induction Ventilation: Mask ventilation without difficulty Laryngoscope Size: Mac and 3 Grade View: Grade II Tube type: Oral Tube size: 8.0 mm Number of attempts: 1 Airway Equipment and Method: Stylet and LTA kit utilized Placement Confirmation: ETT inserted through vocal cords under direct vision,  positive ETCO2 and breath sounds checked- equal and bilateral Secured at: 24 cm Tube secured with: Tape Dental Injury: Teeth and Oropharynx as per  pre-operative assessment

## 2012-04-28 NOTE — Preoperative (Signed)
Beta Blockers   Reason not to administer Beta Blockers:Not Applicable 

## 2012-04-28 NOTE — Progress Notes (Signed)
UR COMPLETED  

## 2012-04-28 NOTE — Op Note (Signed)
Procedure(s): REVERSE SHOULDER ARTHROPLASTY Procedure Note  Lamontae Ricardo male 66 y.o. 04/28/2012  Procedure(s) and Anesthesia Type:    * LEFT REVERSE SHOULDER ARTHROPLASTY - Choice   Indications:  66 y.o. male  With irrepairable rotator cuff tear, pseudo-paralytic shoulder and early rotator cuff tear arthropathy. Pain and dysfunction interfered with quality of life and nonoperative treatment with activity modification, NSAIDS and injections failed.     Surgeon: Mable Paris   Assistants: Damita Lack PA-C [facility] was present and scrubbed throughout the procedure and was essential in positioning, retraction, exposure, and closure)  Anesthesia: General endotracheal anesthesia with preoperative interscalene block    Procedure Detail   Estimated Blood Loss:  300 mL         Drains: 1 medium hemovac  Blood Given: none          Specimens: none        Complications:  * No complications entered in OR log *         Disposition: PACU - hemodynamically stable.         Condition: stable      OPERATIVE FINDINGS:  A DePuy press-fit reverse total shoulder arthroplasty was placed with a  size 14 stem, a 42 eccentric glenosphere, and a 3-mm poly insert. The base plate  fixation was excellent.  PROCEDURE: The patient was identified in the preoperative holding area  where I personally marked the operative site after verifying site, side,  and procedure with the patient. An interscalene block given by  the attending anesthesiologist in the holding area and the patient was taken back to the operating room where all extremities were  carefully padded in position after general anesthesia was induced. She  was placed in a beach-chair position and the operative upper extremity was  prepped and draped in a standard sterile fashion. An approximately 10-  cm incision was made from the tip of the coracoid process to the center  point of the humerus at the level of the axilla.  Dissection was carried  down through subcutaneous tissues to the level of the cephalic vein  which was taken laterally with the deltoid. The pectoralis major was  retracted medially. The subdeltoid space was developed and the lateral  edge of the conjoined tendon was identified. The undersurface of  conjoined tendon was palpated and the musculocutaneous nerve was not in  the field. Retractor was placed underneath the conjoined and second  retractor was placed lateral into the deltoid. The circumflex humeral  artery and vessels were identified and clamped and coagulated. The  biceps tendon was tenodesed to the upper border of the pectoralis major.  The subscapularis was completely torn and retracted.  The  joint was then gently externally rotated while the capsule was released  from the humeral neck around to just beyond the 6 o'clock position. At  this point, the joint was dislocated and the humeral head was presented  into the wound. It was noted that the central two thirds of the head had absolutely no cartilage. The excessive osteophyte formation was removed with a  large rongeur and the intramedullary canal was then entered with  sequential reamers up to a 14-mm reamer which was felt to be the  appropriate size. The cutting guide was used to make the appropriate  head cut and the head was saved potentially bone grafting.  The proximal metaphyseal reaming guide was then placed and the appropriate size reamer was selected. The metaphyseal bone was then reamed. The trial implant  was then placed. The trial was then left in place while the glenoid was exposed with the arm in an  abducted extended position. The anterior and posterior labrum were  completely excised and the capsule was released circumferentially to  allow for exposure of the glenoid for preparation. The guidepin was  placed using the guide in neutral angulation and the reamer was  placed over the guidepin to ream down to  concentric surface. Superior  hand reamer was used as well. The central drill hole was then made and  stayed within the glenoid vault. The Metaglene was then impacted with  Excellent press fit. The superior and inferior screws were then  drilled, measured, and filled with appropriate-sized locking screw  alternatively tightening top and bottom to bring the Metaglene down  tightly against the bone. Each glenoid drill hole was noted to have a fair amount of venous bleeding which all stopped after screw insertion. Posterior and anterior screws were then placed. Fixation was excellent with the exception of the anterior screw which was fair.  The humerus was then again exposed and the trial implant was removed. The gleno sphere was placed in the metaphysis of the proximal humerus and then reduced to the base plate. The eccentric portion was placed inferiorly The glenosphere was then impacted over the [facility] taper and tightened down  with the screw. The trial implant was then again placed in the proximal humerus and the poly trials were tested.  With a +3 poly-trial the shoulder was reducible but quite tight. Therefore 2 additional millimeters were cut the proximal humerus and it was re reamed proximally. At this point with the trial implant the reduction was excellent and soft tissue tension was appropriate. This provided excellent range of motion. Therefore, final humeral stem was placed press fit.  And then the trial polyethylene inserts were tested again and the above implant was felt to be the most appropriate for final insertion. The joint was reduced taken through full range of motion and felt to be stable. Soft tissue tension was appropriate. A medium Hemovac drain was placed out underneath the deltoid prior to closure. The joint was then copiously irrigated with pulse  lavage and the wound was then closed. The subscapularis was not repairable.  Skin was closed with 2-0 Vicryl in a deep dermal layer and  4-0  Monocryl for skin closure. Steri-Strips were applied.Sterile  dressings were then applied as well as a sling. The patient was allowed  to awaken from general anesthesia, transferred to stretcher, and taken  to recovery room in stable condition.   POSTOPERATIVE PLAN: The patient will be kept in the hospital postoperatively one to 2 days for pain control and therapy.

## 2012-04-28 NOTE — Transfer of Care (Signed)
Immediate Anesthesia Transfer of Care Note  Patient: Levi [name]  Procedure(s) Performed: Procedure(s) (LRB) with comments: REVERSE SHOULDER ARTHROPLASTY (Left) - Left shouder reverse total shoulder arthroplasty  Patient Location: PACU  Anesthesia Type:GA combined with regional for post-op pain  Level of Consciousness: awake, alert  and oriented  Airway & Oxygen Therapy: Patient Spontanous Breathing and Patient connected to nasal cannula oxygen  Post-op Assessment: Report given to PACU RN, Post -op Vital signs reviewed and stable and Patient moving all extremities  Post vital signs: Reviewed and stable  Complications: No apparent anesthesia complications

## 2012-04-29 ENCOUNTER — Encounter (HOSPITAL_COMMUNITY): Payer: Self-pay | Admitting: Orthopedic Surgery

## 2012-04-29 LAB — CBC
HCT: 32.5 % — ABNORMAL LOW (ref 39.0–52.0)
Hemoglobin: 10.6 g/dL — ABNORMAL LOW (ref 13.0–17.0)
MCH: 29.2 pg (ref 26.0–34.0)
MCHC: 32.6 g/dL (ref 30.0–36.0)
MCV: 89.5 fL (ref 78.0–100.0)
Platelets: 170 10*3/uL (ref 150–400)
RBC: 3.63 MIL/uL — ABNORMAL LOW (ref 4.22–5.81)
RDW: 14.8 % (ref 11.5–15.5)
WBC: 9.9 10*3/uL (ref 4.0–10.5)

## 2012-04-29 MED ORDER — HYDROMORPHONE HCL 2 MG PO TABS: 2.0000 mg | ORAL_TABLET | ORAL | Status: DC | PRN

## 2012-04-29 MED ORDER — HYDROMORPHONE HCL 2 MG PO TABS
2.0000 mg | ORAL_TABLET | ORAL | Status: DC | PRN
  Administered 2012-04-29 (×2): 2 mg via ORAL
  Filled 2012-04-29 (×2): qty 1

## 2012-04-29 NOTE — Progress Notes (Signed)
PATIENT ID: Levi [name]   1 Day Post-Op Procedure(s) (LRB): REVERSE SHOULDER ARTHROPLASTY (Left)  Subjective: Doing well, pain controlled at rest.  C/o that percocet not working well for him.    Objective:  Filed Vitals:   04/29/12 0509  BP: 126/77  Pulse: 89  Temp: 98.7 F (37.1 C)  Resp: 18     L shoulder dressing c/d/i NVID, firing deltoid  Labs:   Basename 04/29/12 0555  HGB 10.6*   Basename 04/29/12 0555  WBC 9.9  RBC 3.63*  HCT 32.5*  PLT 170  No results found for this basename: NA:2,K:2,CL:2,CO2:2,BUN:2,CREATININE:2,GLUCOSE:2,CALCIUM:2 in the last 72 hours  Assessment and Plan:Doing well Plan home today Will switch to oral dilaudid instead of percocet F/u 10-14 days. OT for H/W/E motion, sling education, no shoulder motion.  VTE proph: SCDs ASA

## 2012-04-29 NOTE — Evaluation (Signed)
Occupational Therapy Evaluation  NEEDS A NEW SLING PRIOR TO D/C Patient Details Name: Levi [name] MRN: [id number] DOB: 09-Nov-1945 Today's Date: 04/29/2012 Time: 4098-1191 OT Time Calculation (min): 29 min  OT Assessment / Plan / Recommendation Clinical Impression  66 yo male s/p Lt Reverse total arthoplasty that does not require anymore acute OT at this time. OT to sign off . Recommend follow up with MD and outpatient    OT Assessment  Patient does not need any further OT services    Follow Up Recommendations  Outpatient OT    Barriers to Discharge      Equipment Recommendations  None recommended by OT    Recommendations for Other Services    Frequency       Precautions / Restrictions Precautions Precautions: Shoulder Precaution Booklet Issued:  (handout provided) Restrictions LUE Weight Bearing: Non weight bearing   Pertinent Vitals/Pain Facial grimace with elbow flexion and extension    ADL  Eating/Feeding: Set up Where Assessed - Eating/Feeding: Bed level Grooming: Wash/dry hands;Supervision/safety Where Assessed - Grooming: Unsupported standing Lower Body Bathing:  (currently with PJ pants on) Where Assessed - Upper Body Dressing:  (educated on dressing operated side first) Statistician: Radiographer, therapeutic Method: Sit to Barista: Regular height toilet Toileting - Architect and Hygiene: Supervision/safety Where Assessed - Engineer, mining and Hygiene: Standing Equipment Used:  (sling) Transfers/Ambulation Related to ADLs: Pt ambulated to restroom pushing IV pole and without UE support at supervision level ADL Comments: pt educated on bed mobility, positioning in bed and chair with pillows, handout for shoulder, ADL with shoulder precautions, and AROM wrist hand elbow. Pt required repetition of exercises x6 during session. Pt with good return demo at end of session. Pt will have wife (A) at d/c. Pt  informed to have OT revisit today prior to d/c if wiife has questions. Pt with a sling that is too short for arm and RN Bev informed. Bev to order via ortho tech new sling    OT Diagnosis:    OT Problem List:   OT Treatment Interventions:     OT Goals Acute Rehab OT Goals OT Goal Formulation: With patient  Visit Information  Last OT Received On: 04/29/12 Assistance Needed: +1    Subjective Data  Subjective: "Miss so I shouldnt be moving it in circles or nothing?"- pt educated that no shoulder AROM ordered at this time in OT orders. Hand wrist and hand exercises reviewed  Patient Stated Goal: pt is to go home today   Prior Functioning     Home Living Lives With: Spouse Available Help at Discharge: Family Type of Home: House Prior Function Level of Independence: Independent Communication Communication: No difficulties Dominant Hand: Right         Vision/Perception     Cognition  Overall Cognitive Status: Appears within functional limits for tasks assessed/performed Arousal/Alertness: Awake/alert Orientation Level: Appears intact for tasks assessed Behavior During Session: [facility] for tasks performed    Extremity/Trunk Assessment Right Upper Extremity Assessment RUE ROM/Strength/Tone: Within functional levels Left Upper Extremity Assessment LUE ROM/Strength/Tone: Deficits Trunk Assessment Trunk Assessment: Normal     Mobility Bed Mobility Bed Mobility: Supine to Sit;Sitting - Scoot to Edge of Bed Supine to Sit: 4: Min assist Sitting - Scoot to [place] of Bed: 4: Min assist Details for Bed Mobility Assistance: edcuated on exiting Rt side to avoid weight bearing on LT side Transfers Transfers: Sit to Stand;Stand to Sit Sit to Stand: 4: Min guard  Stand to Sit: 4: Min guard     Shoulder Instructions     Exercise     Balance     End of Session OT - End of Session Activity Tolerance: Patient tolerated treatment well Patient left: in chair;with call bell/phone  within reach Nurse Communication: Mobility status;Precautions  GO     Tomio, Kirk 04/29/2012, 8:56 AM Pager: [phone]

## 2012-05-06 ENCOUNTER — Ambulatory Visit (INDEPENDENT_AMBULATORY_CARE_PROVIDER_SITE_OTHER): Payer: Medicare Other

## 2012-05-06 DIAGNOSIS — M12519 Traumatic arthropathy, unspecified shoulder: Secondary | ICD-10-CM | POA: Diagnosis not present

## 2012-05-06 DIAGNOSIS — J309 Allergic rhinitis, unspecified: Secondary | ICD-10-CM | POA: Diagnosis not present
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
## 2012-05-09 ENCOUNTER — Encounter (HOSPITAL_BASED_OUTPATIENT_CLINIC_OR_DEPARTMENT_OTHER): Payer: Self-pay | Admitting: Emergency Medicine

## 2012-05-09 ENCOUNTER — Inpatient Hospital Stay (HOSPITAL_BASED_OUTPATIENT_CLINIC_OR_DEPARTMENT_OTHER)
Admission: EM | Admit: 2012-05-09 | Discharge: 2012-05-11 | DRG: 310 | Disposition: A | Discharge status: Home / Self Care | Payer: Medicare Other | Attending: Cardiology | Admitting: Cardiology

## 2012-05-09 DIAGNOSIS — J449 Chronic obstructive pulmonary disease, unspecified: Secondary | ICD-10-CM | POA: Diagnosis present

## 2012-05-09 DIAGNOSIS — Z96619 Presence of unspecified artificial shoulder joint: Secondary | ICD-10-CM

## 2012-05-09 DIAGNOSIS — E785 Hyperlipidemia, unspecified: Secondary | ICD-10-CM | POA: Diagnosis present

## 2012-05-09 DIAGNOSIS — Z96659 Presence of unspecified artificial knee joint: Secondary | ICD-10-CM | POA: Diagnosis not present

## 2012-05-09 DIAGNOSIS — J4489 Other specified chronic obstructive pulmonary disease: Secondary | ICD-10-CM | POA: Diagnosis present

## 2012-05-09 DIAGNOSIS — Z8249 Family history of ischemic heart disease and other diseases of the circulatory system: Secondary | ICD-10-CM

## 2012-05-09 DIAGNOSIS — I480 Paroxysmal atrial fibrillation: Secondary | ICD-10-CM | POA: Diagnosis present

## 2012-05-09 DIAGNOSIS — IMO0002 Reserved for concepts with insufficient information to code with codable children: Secondary | ICD-10-CM | POA: Diagnosis not present

## 2012-05-09 DIAGNOSIS — I4891 Unspecified atrial fibrillation: Secondary | ICD-10-CM

## 2012-05-09 DIAGNOSIS — R002 Palpitations: Secondary | ICD-10-CM | POA: Diagnosis not present

## 2012-05-09 DIAGNOSIS — Z96649 Presence of unspecified artificial hip joint: Secondary | ICD-10-CM | POA: Diagnosis not present

## 2012-05-09 DIAGNOSIS — K219 Gastro-esophageal reflux disease without esophagitis: Secondary | ICD-10-CM | POA: Diagnosis present

## 2012-05-09 DIAGNOSIS — Z79899 Other long term (current) drug therapy: Secondary | ICD-10-CM | POA: Diagnosis not present

## 2012-05-09 DIAGNOSIS — J301 Allergic rhinitis due to pollen: Secondary | ICD-10-CM | POA: Diagnosis not present

## 2012-05-09 DIAGNOSIS — I2510 Atherosclerotic heart disease of native coronary artery without angina pectoris: Secondary | ICD-10-CM | POA: Diagnosis not present

## 2012-05-09 LAB — BASIC METABOLIC PANEL
BUN: 25 mg/dL — ABNORMAL HIGH (ref 6–23)
CO2: 25 mEq/L (ref 19–32)
Calcium: 9.8 mg/dL (ref 8.4–10.5)
Chloride: 101 mEq/L (ref 96–112)
Creatinine, Ser: 0.9 mg/dL (ref 0.50–1.35)
GFR calc Af Amer: >90 mL/min (ref >90)
GFR calc non Af Amer: 87 mL/min — ABNORMAL LOW (ref >90)
Glucose, Bld: 92 mg/dL (ref 70–99)
Potassium: 4.2 mEq/L (ref 3.5–5.1)
Sodium: 138 mEq/L (ref 135–145)

## 2012-05-09 LAB — CBC WITH DIFFERENTIAL/PLATELET
Basophils Absolute: 0 10*3/uL (ref 0.0–0.1)
Basophils Relative: 0 % (ref 0–1)
Eosinophils Absolute: 0.1 10*3/uL (ref 0.0–0.7)
Eosinophils Relative: 1 % (ref 0–5)
HCT: 34 % — ABNORMAL LOW (ref 39.0–52.0)
Hemoglobin: 11.5 g/dL — ABNORMAL LOW (ref 13.0–17.0)
Lymphocytes Relative: 16 % (ref 12–46)
Lymphs Abs: 1.5 10*3/uL (ref 0.7–4.0)
MCH: 29 pg (ref 26.0–34.0)
MCHC: 33.8 g/dL (ref 30.0–36.0)
MCV: 85.9 fL (ref 78.0–100.0)
Monocytes Absolute: 0.9 10*3/uL (ref 0.1–1.0)
Monocytes Relative: 9 % (ref 3–12)
Neutro Abs: 7.1 10*3/uL (ref 1.7–7.7)
Neutrophils Relative %: 74 % (ref 43–77)
Platelets: 360 10*3/uL (ref 150–400)
RBC: 3.96 MIL/uL — ABNORMAL LOW (ref 4.22–5.81)
RDW: 14.2 % (ref 11.5–15.5)
WBC: 9.6 10*3/uL (ref 4.0–10.5)

## 2012-05-09 LAB — TROPONIN I: Troponin I: <0.3 ng/mL (ref <0.30)

## 2012-05-09 MED ORDER — TRAZODONE 25 MG HALF TABLET
25.0000 mg | ORAL_TABLET | Freq: Every day | ORAL | Status: DC
  Administered 2012-05-09 – 2012-05-10 (×2): 25 mg via ORAL
  Filled 2012-05-09 (×4): qty 1

## 2012-05-09 MED ORDER — FLUTICASONE PROPIONATE HFA 44 MCG/ACT IN AERO
2.0000 | INHALATION_SPRAY | Freq: Two times a day (BID) | RESPIRATORY_TRACT | Status: DC
  Administered 2012-05-09 – 2012-05-11 (×4): 2 via RESPIRATORY_TRACT
  Filled 2012-05-09: qty 10.6

## 2012-05-09 MED ORDER — ALLOPURINOL 100 MG PO TABS
100.0000 mg | ORAL_TABLET | Freq: Every day | ORAL | Status: DC
Start: 2012-05-10 — End: 2012-05-11
  Administered 2012-05-10 – 2012-05-11 (×2): 100 mg via ORAL
  Filled 2012-05-09 (×2): qty 1

## 2012-05-09 MED ORDER — DILTIAZEM HCL 50 MG/10ML IV SOLN
10.0000 mg | Freq: Once | INTRAVENOUS | Status: DC
  Filled 2012-05-09: qty 2

## 2012-05-09 MED ORDER — DILTIAZEM HCL 100 MG IV SOLR
INTRAVENOUS | Status: AC
  Administered 2012-05-09: 10 mg
  Filled 2012-05-09: qty 100

## 2012-05-09 MED ORDER — SALMETEROL XINAFOATE 50 MCG/DOSE IN AEPB
1.0000 | INHALATION_SPRAY | Freq: Two times a day (BID) | RESPIRATORY_TRACT | Status: DC
  Administered 2012-05-09 – 2012-05-11 (×4): 1 via RESPIRATORY_TRACT
  Filled 2012-05-09: qty 0

## 2012-05-09 MED ORDER — SODIUM CHLORIDE 0.9 % IJ SOLN
3.0000 mL | Freq: Two times a day (BID) | INTRAMUSCULAR | Status: DC
  Administered 2012-05-10 (×2): 3 mL via INTRAVENOUS

## 2012-05-09 MED ORDER — PANTOPRAZOLE SODIUM 40 MG PO TBEC
40.0000 mg | DELAYED_RELEASE_TABLET | Freq: Two times a day (BID) | ORAL | Status: DC
  Administered 2012-05-10 – 2012-05-11 (×3): 40 mg via ORAL
  Filled 2012-05-09 (×3): qty 1

## 2012-05-09 MED ORDER — DILTIAZEM HCL 100 MG IV SOLR
10.0000 mg/h | INTRAVENOUS | Status: DC
  Administered 2012-05-09 (×2): 10 mg/h via INTRAVENOUS
  Filled 2012-05-09 (×2): qty 100

## 2012-05-09 MED ORDER — HEPARIN BOLUS VIA INFUSION
4500.0000 [IU] | Freq: Once | INTRAVENOUS | Status: AC
  Administered 2012-05-09: 4500 [IU] via INTRAVENOUS
  Filled 2012-05-09: qty 4500

## 2012-05-09 MED ORDER — DILTIAZEM HCL 100 MG IV SOLR
5.0000 mg/h | INTRAVENOUS | Status: DC
  Administered 2012-05-09 (×2): 5 mg/h via INTRAVENOUS

## 2012-05-09 MED ORDER — HEPARIN (PORCINE) IN NACL 100-0.45 UNIT/ML-% IJ SOLN
1400.0000 [IU]/h | INTRAMUSCULAR | Status: DC
  Administered 2012-05-09: 1100 [IU]/h via INTRAVENOUS
  Filled 2012-05-09 (×2): qty 250

## 2012-05-09 MED ORDER — PREDNISONE 10 MG PO TABS
10.0000 mg | ORAL_TABLET | Freq: Every day | ORAL | Status: DC
  Administered 2012-05-10 – 2012-05-11 (×2): 10 mg via ORAL
  Filled 2012-05-09 (×3): qty 1

## 2012-05-09 MED ORDER — OFF THE BEAT BOOK
Freq: Once | Status: AC
  Administered 2012-05-10: 07:00:00
  Filled 2012-05-09: qty 1

## 2012-05-09 MED ORDER — ISOSORBIDE MONONITRATE ER 30 MG PO TB24
30.0000 mg | ORAL_TABLET | Freq: Every day | ORAL | Status: DC
  Administered 2012-05-10 – 2012-05-11 (×2): 30 mg via ORAL
  Filled 2012-05-09 (×2): qty 1

## 2012-05-09 MED ORDER — ATORVASTATIN CALCIUM 10 MG PO TABS
10.0000 mg | ORAL_TABLET | Freq: Every day | ORAL | Status: DC
  Administered 2012-05-10: 10 mg via ORAL
  Filled 2012-05-09 (×2): qty 1

## 2012-05-09 MED ORDER — ALBUTEROL SULFATE HFA 108 (90 BASE) MCG/ACT IN AERS
2.0000 | INHALATION_SPRAY | RESPIRATORY_TRACT | Status: DC | PRN
  Filled 2012-05-09: qty 6.7
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
## 2012-05-09 NOTE — ED Notes (Signed)
Pt sts he feels slightly better, HR somewhat slower, still in the 120s at times

## 2012-05-09 NOTE — ED Notes (Signed)
sts had onset of irregular heart rate this AM while exercising, no CP, mild SOB and dizzyness, s/p left shoulder replacement 10d ago, denies pain or recetn illness, NAD

## 2012-05-09 NOTE — ED Provider Notes (Signed)
History     CSN: [id number]  Arrival date & time 05/09/12  1359   First MD Initiated Contact with Patient 05/09/12 1400      Chief Complaint  Patient presents with  . Irregular Heart Beat     Patient is a 66 y.o. male presenting with palpitations. The history is provided by the patient.  Palpitations  This is a new problem. The current episode started 6 to 12 hours ago. The problem occurs constantly. The problem has been gradually worsening. The problem is associated with exercise. Episode Length: several hours. Associated symptoms include irregular heartbeat, dizziness and shortness of breath. Pertinent negatives include no diaphoresis, no fever, no chest pain, no near-syncope, no syncope, no abdominal pain and no back pain. He has tried bed rest for the symptoms. The treatment provided mild relief.  pt reports he was exercising this morning when he felt his heart racing and seemed irregular He reports this has happened previously but this has lasted the longest No syncope No CP He reports mild SOB He denies known h/o afib He reports recent shoulder surgery earlier this month  Past Medical History  Diagnosis Date  . Asthma   . ALLERGIC RHINITIS   . COPD (chronic obstructive pulmonary disease)   . Complication of anesthesia     slow to wake up  . Hyperlipidemia     takes Lipitor daily  . Agent orange exposure 1970's    "shot my lungs" (04/28/2012)  . Shortness of breath     lying/sitting/standing  . Joint pain   . Joint swelling   . Elevated uric acid in blood     takes Allopurinol daily  . Bruises easily   . GERD (gastroesophageal reflux disease)     takes Protonix daily  . Diverticulitis   . Enlarged prostate   . Pneumonia ~ 2010    "once" (04/28/2012)  . Chronic bronchitis     "used to get it q yr; last time ?2008" (04/28/2012)  . H/O hiatal hernia   . Degenerative disk disease     "qwhere" (04/28/2012)    Past Surgical History  Procedure Date  . Knee surgery    . Gallbladder surgery   . Total hip arthroplasty 2011    "left" (04/28/2012)  . Lateral / posterior combined fusion lumbar spine 2012  . Joint replacement   . Total knee arthroplasty 01/10/2012    Procedure: TOTAL KNEE ARTHROPLASTY;  Surgeon: Harvie Junior, MD;  Location: [facility] OR;  Service: Orthopedics;;  left total knee arthroplasty  . Cardiac catheterization 11/2008    Dr. Chales Abrahams  . Knee arthroscopy     "had them both scoped a bunch of times" (12/07/27/2011)  . Colonoscopy   . Esophagogastroduodenoscopy   . Reverse shoulder arthroplasty 04/28/2012    left  . Tonsillectomy and adenoidectomy 1954  . Cholecystectomy 1990's  . Eye surgery 2011    "bilaterally; steroidal encapsulation" (04/28/2012)  . Reverse shoulder arthroplasty 04/28/2012    Procedure: REVERSE SHOULDER ARTHROPLASTY;  Surgeon: Mable Paris, MD;  Location: [facility] OR;  Service: Orthopedics;  Laterality: Left;  Left shouder reverse total shoulder arthroplasty    Family History  Problem Relation Age of Onset  . Heart disease Father   . Prostate cancer Brother     History  Substance Use Topics  . Smoking status: Never Smoker   . Smokeless tobacco: Never Used  . Alcohol Use: No      Review of Systems  Constitutional: Negative for fever  and diaphoresis.  Respiratory: Positive for shortness of breath.   Cardiovascular: Positive for palpitations. Negative for chest pain, syncope and near-syncope.  Gastrointestinal: Negative for abdominal pain.  Musculoskeletal: Negative for back pain.  Neurological: Positive for dizziness.  Hematological: Bruises/bleeds easily.  Psychiatric/Behavioral: Negative for agitation.  All other systems reviewed and are negative.    Allergies  Morphine and related and Levofloxacin  Home Medications   Current Outpatient Rx  Name  Route  Sig  Dispense  Refill  . ALBUTEROL SULFATE HFA 108 (90 BASE) MCG/ACT IN AERS   Inhalation   Inhale 2 puffs into the lungs every 4 (four) hours  as needed for wheezing.   3 Inhaler   3   . ALLOPURINOL 100 MG PO TABS   Oral   Take 100 mg by mouth daily.           . ATORVASTATIN CALCIUM 10 MG PO TABS   Oral   Take 10 mg by mouth daily.          . BECLOMETHASONE DIPROPIONATE 80 MCG/ACT IN AERS   Inhalation   Inhale 2 puffs into the lungs 2 (two) times daily.   1 Inhaler   11   . CANDESARTAN CILEXETIL 4 MG PO TABS   Oral   Take 4 mg by mouth daily.         [name] FORMOTEROL FUMARATE 12 MCG IN CAPS   Inhalation   Place 1 capsule (12 mcg total) into inhaler and inhale 2 (two) times daily.   60 capsule   11   . HYDROMORPHONE HCL 2 MG PO TABS   Oral   Take 1-2 tablets (2-4 mg total) by mouth every 4 (four) hours as needed for pain.   50 tablet   0   . ISOSORBIDE MONONITRATE ER 30 MG PO TB24   Oral   Take 30 mg by mouth daily.          [name] PANTOPRAZOLE SODIUM 40 MG PO TBEC   Oral   Take 1 tablet (40 mg total) by mouth 2 (two) times daily.   60 tablet   11   . PREDNISONE 10 MG PO TABS   Oral   Take 1 tablet (10 mg total) by mouth daily.   90 tablet   3     BP 135/105  Pulse 121  Resp 24  Ht 6\' 2"  (1.88 m)  Wt 160 lb (72.576 kg)  BMI 20.54 kg/m2  SpO2 100% BP 135/77  Pulse 87  Resp 19  Ht 6\' 2"  (1.88 m)  Wt 160 lb (72.576 kg)  BMI 20.54 kg/m2  SpO2 100%   Physical Exam CONSTITUTIONAL: Well developed/well nourished HEAD AND FACE: Normocephalic/atraumatic EYES: EOMI/PERRL ENMT: Mucous membranes moist NECK: supple no meningeal signs SPINE:entire spine nontender CV: tachycardic, irregular, no loud murmurs noted LUNGS: Lungs are clear to auscultation bilaterally, no apparent distress ABDOMEN: soft, nontender, no rebound or guarding GU:no cva tenderness NEURO: Pt is awake/alert, moves all extremitiesx4 EXTREMITIES: pulses normal, full ROM. Healing incision to left shoulder SKIN: warm, color normal PSYCH: no abnormalities of mood noted  ED Course  Procedures   CRITICAL CARE Performed by:  Joya Gaskins   Total critical care time: 36  Critical care time was exclusive of separately billable procedures and treating other patients.  Critical care was necessary to treat or prevent imminent or life-threatening deterioration.  Critical care was time spent personally by me on the following activities: development of treatment plan with patient  and/or surrogate as well as nursing, discussions with consultants, evaluation of patient's response to treatment, examination of patient, obtaining history from patient or surrogate, ordering and performing treatments and interventions, ordering and review of laboratory studies, ordering and review of radiographic studies, pulse oximetry and re-evaluation of patient's condition.    Labs Reviewed  BASIC METABOLIC PANEL  CBC WITH DIFFERENTIAL  TROPONIN I   2:39 PM Pt here with afib with RVR, HR>130 requiring IV gtt of cardizem This appears to be new onset Will follow closely 3:19 PM Pt still in afib HR somewhat improved though still havings RVR intermittently  4:09 PM Labs reassuring He denies CP at this time.  He is improved His HR is improving D/w dr Donnie Aho, will admit to tele at [facility], under care of dr Anne Fu (that is patients primary cardiologist)  MDM  Nursing notes including past medical history and social history reviewed and considered in documentation Previous records reviewed and considered - recent shoulder surgery noted in chart Labs/vital reviewed and considered        Date: 05/09/2012  Rate: 127  Rhythm: atrial fibrillation  QRS Axis: normal  Intervals: normal  ST/T Wave abnormalities: nonspecific ST changes  Conduction Disutrbances:none  Narrative Interpretation:   Old EKG Reviewed: changes noted and previous EKG shows sinus rhythm      Date: 05/09/2012 1506  Rate: 95  Rhythm: atrial fibrillation  QRS Axis: normal  Intervals: normal  ST/T Wave abnormalities: nonspecific ST changes  Conduction  Disutrbances:none  Narrative Interpretation:   Old EKG Reviewed: rate is improved on this EKG    Joya Gaskins, MD 05/09/12 1610

## 2012-05-09 NOTE — ED Notes (Signed)
Report given to Sam , RN with [facility]

## 2012-05-09 NOTE — H&P (Signed)
Cardiology History and Physical  Joycelyn Rua, MD  History of Present Illness (and review of medical records): Levi [name] is a 66 y.o. male who presents for evaluation of atrial fibrillation from [facility].  He has been seen in the pas by [facility] Dr. Anne Fu.  He has had reportedly cardiac catheterization with no obstructive lesions, otherwise no known CHD.  He is s/p recent reverse shoulder arthorplasty on 04/28/2012.  He underwent surgery with no apparent complications and was discharged home in stable condition.  He now presents with atrial fibrillation of which he has no clear history.  He recalls previously wearing a holter monitor in the past but with normal results.  He was actually at the gym today and completed his work out.  He felt a distinct heart "fluttering" along with lightheadness.  He denied any chest pain or shortness of breath.  As symptoms did not go away, he presented for further evaluation.  He was found to be in Afib with RVR and was given IV Dilt bolus then infusion prior to transfer.    Review of Systems Further review of systems was otherwise negative other than stated in HPI.  Patient Active Problem List   Diagnosis Date Noted  . Osteoarthritis of left knee 01/10/2012  . Acute URI 04/11/2011  . Rash and nonspecific skin eruption 12/24/2010  . Allergic rhinitis due to pollen 07/23/2010  . Asthma, intrinsic 03/06/2009   Past Medical History  Diagnosis Date  . Asthma   . ALLERGIC RHINITIS   . COPD (chronic obstructive pulmonary disease)   . Complication of anesthesia     slow to wake up  . Hyperlipidemia     takes Lipitor daily  . Agent orange exposure 1970's    "shot my lungs" (04/28/2012)  . Shortness of breath     lying/sitting/standing  . Joint pain   . Joint swelling   . Elevated uric acid in blood     takes Allopurinol daily  . Bruises easily   . GERD (gastroesophageal reflux disease)     takes Protonix daily  . Diverticulitis   .  Enlarged prostate   . Pneumonia ~ 2010    "once" (04/28/2012)  . Chronic bronchitis     "used to get it q yr; last time ?2008" (04/28/2012)  . H/O hiatal hernia   . Degenerative disk disease     "qwhere" (04/28/2012)    Past Surgical History  Procedure Date  . Knee surgery   . Gallbladder surgery   . Total hip arthroplasty 2011    "left" (04/28/2012)  . Lateral / posterior combined fusion lumbar spine 2012  . Joint replacement   . Total knee arthroplasty 01/10/2012    Procedure: TOTAL KNEE ARTHROPLASTY;  Surgeon: Harvie Junior, MD;  Location: [facility] OR;  Service: Orthopedics;;  left total knee arthroplasty  . Cardiac catheterization 11/2008    Dr. Chales Abrahams  . Knee arthroscopy     "had them both scoped a bunch of times" (12/07/27/2011)  . Colonoscopy   . Esophagogastroduodenoscopy   . Reverse shoulder arthroplasty 04/28/2012    left  . Tonsillectomy and adenoidectomy 1954  . Cholecystectomy 1990's  . Eye surgery 2011    "bilaterally; steroidal encapsulation" (04/28/2012)  . Reverse shoulder arthroplasty 04/28/2012    Procedure: REVERSE SHOULDER ARTHROPLASTY;  Surgeon: Mable Paris, MD;  Location: [facility] OR;  Service: Orthopedics;  Laterality: Left;  Left shouder reverse total shoulder arthroplasty    Prescriptions prior to admission  Medication  Sig Dispense Refill  . albuterol (PROAIR HFA) 108 (90 BASE) MCG/ACT inhaler Inhale 2 puffs into the lungs every 4 (four) hours as needed for wheezing.  3 Inhaler  3  . allopurinol (ZYLOPRIM) 100 MG tablet Take 100 mg by mouth daily.        [name] atorvastatin (LIPITOR) 10 MG tablet Take 10 mg by mouth daily.       . beclomethasone (QVAR) 80 MCG/ACT inhaler Inhale 2 puffs into the lungs 2 (two) times daily.  1 Inhaler  11  . candesartan (ATACAND) 4 MG tablet Take 2 mg by mouth daily.       . formoterol (FORADIL AEROLIZER) 12 MCG capsule for inhaler Place 1 capsule (12 mcg total) into inhaler and inhale 2 (two) times daily.  60 capsule  11  .  isosorbide mononitrate (IMDUR) 30 MG 24 hr tablet Take 30 mg by mouth daily.       . pantoprazole (PROTONIX) 40 MG tablet Take 1 tablet (40 mg total) by mouth 2 (two) times daily.  60 tablet  11  . predniSONE (DELTASONE) 10 MG tablet Take 1 tablet (10 mg total) by mouth daily.  90 tablet  3   Allergies  Allergen Reactions  . Morphine And Related Other (See Comments)    Light headed and sweaty; "my heart races" (04/28/2012)  . Levofloxacin Itching    History  Substance Use Topics  . Smoking status: Never Smoker   . Smokeless tobacco: Never Used  . Alcohol Use: No    Family History  Problem Relation Age of Onset  . Heart disease Father   . Prostate cancer Brother      Objective: Patient Vitals for the past 8 hrs:  BP Temp Temp src Pulse Resp SpO2 Height Weight  05/09/12 1819 135/68 mmHg 97.9 F (36.6 C) Oral 87  - 97 % 6\' 2"  (1.88 m) 74.2 kg (163 lb 9.3 oz)  05/09/12 1630 125/83 mmHg - - 94  20  100 % - -  05/09/12 1609 135/77 mmHg - - - - - - -  05/09/12 1600 122/83 mmHg - - 86  22  99 % - -  05/09/12 1540 - - - 87  19  100 % - -  05/09/12 1530 135/77 mmHg - - 119  23  100 % - -  05/09/12 1520 - - - 70  20  100 % - -  05/09/12 1500 134/81 mmHg - - 67  15  100 % - -  05/09/12 1430 115/55 mmHg - - 80  16  100 % - -  05/09/12 1404 135/105 mmHg - - 121  24  100 % 6\' 2"  (1.88 m) 72.576 kg (160 lb)   General Appearance:    Alert, cooperative, no distress, appears stated age  Head:    Normocephalic, without obvious abnormality, atraumatic  Eyes:     PERRL, EOMI, anicteric sclerae  Neck:   Supple, no carotid bruit or JVD  Lungs:     Clear to auscultation bilaterally, respirations unlabored  Heart:    Regular rate and rhythm, S1 and S2 normal, no murmur  Abdomen:     Soft, non-tender, normoactive bowel sounds  Extremities:   Left shoulder brace  Pulses:   2+ and symmetric all extremities  Skin:   no rashes or lesions  Neurologic:   No focal deficits. AAO x3   Results for orders  placed during the hospital encounter of 05/09/12 (from the past 48 hour(s))  BASIC METABOLIC PANEL     Status: Abnormal   Collection Time   05/09/12  2:10 PM      Component Value Range Comment   Sodium 138  135 - 145 mEq/L    Potassium 4.2  3.5 - 5.1 mEq/L    Chloride 101  96 - 112 mEq/L    CO2 25  19 - 32 mEq/L    Glucose, Bld 92  70 - 99 mg/dL    BUN 25 (*) 6 - 23 mg/dL    Creatinine, Ser 1.61  0.50 - 1.35 mg/dL    Calcium 9.8  8.4 - 09.6 mg/dL    GFR calc non Af Amer 87 (*) >90 mL/min    GFR calc Af Amer >90  >90 mL/min   CBC WITH DIFFERENTIAL     Status: Abnormal   Collection Time   05/09/12  2:10 PM      Component Value Range Comment   WBC 9.6  4.0 - 10.5 K/uL    RBC 3.96 (*) 4.22 - 5.81 MIL/uL    Hemoglobin 11.5 (*) 13.0 - 17.0 g/dL    HCT 04.5 (*) 40.9 - 52.0 %    MCV 85.9  78.0 - 100.0 fL    MCH 29.0  26.0 - 34.0 pg    MCHC 33.8  30.0 - 36.0 g/dL    RDW 81.1  91.4 - 78.2 %    Platelets 360  150 - 400 K/uL    Neutrophils Relative 74  43 - 77 %    Lymphocytes Relative 16  12 - 46 %    Monocytes Relative 9  3 - 12 %    Eosinophils Relative 1  0 - 5 %    Basophils Relative 0  0 - 1 %    Neutro Abs 7.1  1.7 - 7.7 K/uL    Lymphs Abs 1.5  0.7 - 4.0 K/uL    Monocytes Absolute 0.9  0.1 - 1.0 K/uL    Eosinophils Absolute 0.1  0.0 - 0.7 K/uL    Basophils Absolute 0.0  0.0 - 0.1 K/uL    RBC Morphology ELLIPTOCYTES     TROPONIN I     Status: Normal   Collection Time   05/09/12  2:10 PM      Component Value Range Comment   Troponin I <0.30  <0.30 ng/mL    No results found.  ECG:  Atrial fibrillation with RVR HR 127.  Last ecg 04/2011, NSR HR 60  Assessment: Atrial fibrillation with RVR  Plan:  1. Admit to Cardiology, [facility] Dr. Anne Fu 2. Continuous monitoring on Telemetry. 3. Repeat ekg on admit, prn chest pain or arrythmia 4. Medical management: continue Dilt gtt for rate control 5. Anticoagulation with heparin 6. May need possible TEE/DCCV

## 2012-05-10 ENCOUNTER — Encounter (HOSPITAL_COMMUNITY): Payer: Self-pay | Admitting: *Deleted

## 2012-05-10 DIAGNOSIS — I4891 Unspecified atrial fibrillation: Principal | ICD-10-CM

## 2012-05-10 LAB — BASIC METABOLIC PANEL
BUN: 18 mg/dL (ref 6–23)
CO2: 24 mEq/L (ref 19–32)
Calcium: 9.2 mg/dL (ref 8.4–10.5)
Chloride: 106 mEq/L (ref 96–112)
Creatinine, Ser: 0.83 mg/dL (ref 0.50–1.35)
GFR calc Af Amer: >90 mL/min (ref >90)
GFR calc non Af Amer: >90 mL/min (ref >90)
Glucose, Bld: 101 mg/dL — ABNORMAL HIGH (ref 70–99)
Potassium: 3.5 mEq/L (ref 3.5–5.1)
Sodium: 140 mEq/L (ref 135–145)

## 2012-05-10 LAB — HEPARIN LEVEL (UNFRACTIONATED)
Heparin Unfractionated: 0.18 IU/mL — ABNORMAL LOW (ref 0.30–0.70)
Heparin Unfractionated: 0.38 IU/mL (ref 0.30–0.70)

## 2012-05-10 LAB — CBC
HCT: 34.6 % — ABNORMAL LOW (ref 39.0–52.0)
Hemoglobin: 11.4 g/dL — ABNORMAL LOW (ref 13.0–17.0)
MCH: 28.9 pg (ref 26.0–34.0)
MCHC: 32.9 g/dL (ref 30.0–36.0)
MCV: 87.6 fL (ref 78.0–100.0)
Platelets: 347 10*3/uL (ref 150–400)
RBC: 3.95 MIL/uL — ABNORMAL LOW (ref 4.22–5.81)
RDW: 14.5 % (ref 11.5–15.5)
WBC: 6 10*3/uL (ref 4.0–10.5)

## 2012-05-10 LAB — PROTIME-INR
INR: 1.09 (ref 0.00–1.49)
Prothrombin Time: 14 seconds (ref 11.6–15.2)

## 2012-05-10 LAB — TSH: TSH: 1.979 u[IU]/mL (ref 0.350–4.500)

## 2012-05-10 MED ORDER — ASPIRIN 81 MG PO CHEW
81.0000 mg | CHEWABLE_TABLET | Freq: Once | ORAL | Status: AC
  Administered 2012-05-10: 81 mg via ORAL
  Filled 2012-05-10: qty 1

## 2012-05-10 MED ORDER — HEPARIN BOLUS VIA INFUSION
2000.0000 [IU] | Freq: Once | INTRAVENOUS | Status: AC
  Administered 2012-05-10: 2000 [IU] via INTRAVENOUS
  Filled 2012-05-10: qty 2000

## 2012-05-10 MED ORDER — DILTIAZEM HCL ER COATED BEADS 120 MG PO CP24
120.0000 mg | ORAL_CAPSULE | Freq: Every day | ORAL | Status: DC
  Administered 2012-05-10 – 2012-05-11 (×2): 120 mg via ORAL
  Filled 2012-05-10 (×2): qty 1

## 2012-05-10 NOTE — Progress Notes (Signed)
   Subjective:  Denies CP or dyspnea   Objective:  Filed Vitals:   05/09/12 2100 05/10/12 0100 05/10/12 0500 05/10/12 0825  BP: 135/75 122/79 122/72   Pulse: 85 75 75   Temp: 98.1 F (36.7 C) 97.7 F (36.5 C) 98.5 F (36.9 C)   TempSrc:      Resp: 16 16 16    Height:      Weight:   164 lb 3.2 oz (74.481 kg)   SpO2: 98% 99% 98% 99%    Intake/Output from previous day:  Intake/Output Summary (Last 24 hours) at 05/10/12 0914 Last data filed at 05/10/12 4540  Gross per 24 hour  Intake 266.45 ml  Output    370 ml  Net -103.55 ml    Physical Exam: Physical exam: Well-developed well-nourished in no acute distress.  Skin is warm and dry.  HEENT is normal.  Neck is supple. .  Chest is clear to auscultation with normal expansion.  Cardiovascular exam is regular rate and rhythm.  Abdominal exam nontender or distended. No masses palpated. Extremities show no edema. neuro grossly intact    Lab Results: Basic Metabolic Panel:  Basename 05/10/12 0545 05/09/12 1410  NA 140 138  K 3.5 4.2  CL 106 101  CO2 24 25  GLUCOSE 101* 92  BUN 18 25*  CREATININE 0.83 0.90  CALCIUM 9.2 9.8  MG -- --  PHOS -- --   CBC:  Basename 05/10/12 0545 05/09/12 1410  WBC 6.0 9.6  NEUTROABS -- 7.1  HGB 11.4* 11.5*  HCT 34.6* 34.0*  MCV 87.6 85.9  PLT 347 360   Cardiac Enzymes:  Basename 05/09/12 1410  CKTOTAL --  CKMB --  CKMBINDEX --  TROPONINI <0.30     Assessment/Plan:  1 PAF - Patient has converted to sinus. Dc IV carizem and begin 120 mg CD daily; check echo and TSH; CHADS score is 0. Would not anticoagulate long term; begin ASA. ? If atrial fibrillation related to recent surgery.  Olga Millers 05/10/2012, 9:14 AM

## 2012-05-10 NOTE — Progress Notes (Signed)
  Echocardiogram 2D Echocardiogram has been performed.  Levi [name] 05/10/2012, 11:11 AM

## 2012-05-10 NOTE — Progress Notes (Signed)
ANTICOAGULATION CONSULT NOTE  Pharmacy Consult for Heparin Indication: atrial fibrillation  Allergies  Allergen Reactions  . Morphine And Related Other (See Comments)    Light headed and sweaty; "my heart races" (04/28/2012)  . Levofloxacin Itching    Patient Measurements: Height: 6\' 2"  (188 cm) Weight: 163 lb 9.3 oz (74.2 kg) IBW/kg (Calculated) : 82.2   Vital Signs: Temp: 97.7 F (36.5 C) (12/15 0100) BP: 122/79 mmHg (12/15 0100) Pulse Rate: 75  (12/15 0100)  Labs:  Basename 05/10/12 0545 05/09/12 1410  HGB 11.4* 11.5*  HCT 34.6* 34.0*  PLT 347 360  APTT -- --  LABPROT 14.0 --  INR 1.09 --  HEPARINUNFRC 0.18* --  CREATININE -- 0.90  CKTOTAL -- --  CKMB -- --  TROPONINI -- <0.30    Estimated Creatinine Clearance: 85.9 ml/min (by C-G formula based on Cr of 0.9).  Assessment: 66 yo male with afib for heparin.  Goal of Therapy:  Heparin level 0.3-0.7 units/ml Monitor platelets by anticoagulation protocol: Yes   Plan:  Heparin 2000 units IV bolus, then increase heparin 1400 units/hr Check heparin level in 6 hours.  Geannie Risen, PharmD, BCPS  05/10/2012 6:41 AM

## 2012-05-11 DIAGNOSIS — I480 Paroxysmal atrial fibrillation: Secondary | ICD-10-CM | POA: Diagnosis present

## 2012-05-11 DIAGNOSIS — I4891 Unspecified atrial fibrillation: Principal | ICD-10-CM | POA: Diagnosis present

## 2012-05-11 MED ORDER — DILTIAZEM HCL ER COATED BEADS 120 MG PO CP24: 120.0000 mg | ORAL_CAPSULE | Freq: Every day | ORAL | Status: DC

## 2012-05-11 NOTE — Discharge Summary (Signed)
Patient ID: Levi [name] MRN: [id number] DOB/AGE: 65/08/1945 65 y.o.  Admit date: 05/09/2012 Discharge date: 05/11/2012  Dr.Ezri Landers - Cardiology  Primary Discharge Diagnosis: New-onset paroxysmal atrial fibrillation  Secondary Discharge Diagnosis: COPD, mildly to moderately reduced ejection fraction 40-50%, anxiety, hyperlipidemia,, non-flow-limiting CAD with 20% calcified left main.  Significant Diagnostic Studies: Echocardiogram with ejection fraction in the 40-50% range mildly to moderately reduced. When compared to prior echocardiogram this may be slightly decreased however his ejection fraction calculated on nuclear stress test in 2010 was 43%. This prompted cardiac catheterization in 2010 which did not show any significant flow limiting CAD.    Hospital Course: 66-year-old male with new onset paroxysmal atrial fibrillation following shoulder replacement with symptomatic palpitations following exercise on treadmill. He had no angina. He shortness of breath may have been slightly increased but not significantly. He has baseline COPD. In the emergency department he was given IV diltiazem and an approximately 24-hour is his atrial fibrillation converted to sinus rhythm. He was transition to by mouth diltiazem 120 mg. long-acting. Echocardiogram as described above with mildly reduced/moderately reduced ejection fraction. This is no significant change from prior. He has not had diabetes, no significant hypertension, no prior stroke, he is 66. CHADS - 0. Aspirin only at this point.  He knows to contact me if he begins to feel symptomatic palpitations/atrial fibrillation once again. TSH was normal. Creatinine 0.83, hemoglobin 11.4.   Discharge Exam: Blood pressure 130/68, pulse 66, temperature 98.5 F (36.9 C), temperature source Oral, resp. rate 18, height 6' 2" (1.88 m), weight 74.844 kg (165 lb), SpO2 98.00%.    General: Alert and oriented x3 no acute distress, wearing left arm  sling Cardiovascular: Regular rate and rhythm no murmurs rubs or gallops Lungs: Clear to auscultation bilaterally Abdomen: Soft, nontender, positive bowel sounds Extremities: No edema   Labs:   Lab Results  Component Value Date   WBC 6.0 05/10/2012   HGB 11.4* 05/10/2012   HCT 34.6* 05/10/2012   MCV 87.6 05/10/2012   PLT 347 05/10/2012     Lab 05/10/12 0545  NA 140  K 3.5  CL 106  CO2 24  BUN 18  CREATININE 0.83  CALCIUM 9.2  PROT --  BILITOT --  ALKPHOS --  ALT --  AST --  GLUCOSE 101*   Lab Results  Component Value Date   CKTOTAL 50 11/10/2008   CKMB 2.8 01/30/2008   TROPONINI <0.30 05/09/2012    EKG: Atrial fibrillation 127 beats per minute. Telemetry currently sinus rhythm/sinus bradycardia. No significant pauses.   FOLLOW UP PLANS AND APPOINTMENTS     Discharge Orders    Future Appointments: Provider: Department: Dept Phone: Center:   07/10/2012 9:15 AM Keith M Clance, MD [facility] [phone] None   12/28/2012 9:15 AM Clinton D Young, MD [facility] [phone] None     Future Orders Please Complete By Expires   Diet - low sodium heart healthy      Increase activity slowly          Medication List     As of 05/11/2012  8:26 AM    TAKE these medications         albuterol 108 (90 BASE) MCG/ACT inhaler   Commonly known as: PROVENTIL HFA;VENTOLIN HFA   Inhale 2 puffs into the lungs every 4 (four) hours as needed for wheezing.      allopurinol 100 MG tablet   Commonly known as: ZYLOPRIM   Take 100 mg by mouth daily.        atorvastatin 10 MG tablet   Commonly known as: LIPITOR   Take 10 mg by mouth daily.      beclomethasone 80 MCG/ACT inhaler   Commonly known as: QVAR   Inhale 2 puffs into the lungs 2 (two) times daily.      candesartan 4 MG tablet   Commonly known as: ATACAND   Take 2 mg by mouth daily.      diltiazem 120 MG 24 hr capsule   Commonly known as: CARDIZEM CD   Take 1 capsule (120 mg total) by mouth  daily.      formoterol 12 MCG capsule for inhaler   Commonly known as: FORADIL   Place 1 capsule (12 mcg total) into inhaler and inhale 2 (two) times daily.      isosorbide mononitrate 30 MG 24 hr tablet   Commonly known as: IMDUR   Take 30 mg by mouth daily.      pantoprazole 40 MG tablet   Commonly known as: PROTONIX   Take 1 tablet (40 mg total) by mouth 2 (two) times daily.      predniSONE 10 MG tablet   Commonly known as: DELTASONE   Take 1 tablet (10 mg total) by mouth daily.        Follow-up Information    Follow up with Shatarra Wehling, MD. On 05/26/2012. (3pm)    Contact information:   [address] [place] [address] [phone]          BRING ALL MEDICATIONS WITH YOU TO FOLLOW UP APPOINTMENTS  Time spent with patient to include physician time: 25 minutes  Signed: Ameena Vesey 05/11/2012, 8:26 AM    

## 2012-05-11 NOTE — Progress Notes (Signed)
Patient ID: Levi [name] MRN: [id number] DOB/AGE: 12/24/1945 66 y.o.  Admit date: 05/09/2012 Discharge date: 05/11/2012  Dr.Skains - Cardiology  Primary Discharge Diagnosis: New-onset paroxysmal atrial fibrillation  Secondary Discharge Diagnosis: COPD, mildly to moderately reduced ejection fraction 40-50%, anxiety, hyperlipidemia,, non-flow-limiting CAD with 20% calcified left main.  Significant Diagnostic Studies: Echocardiogram with ejection fraction in the 40-50% range mildly to moderately reduced. When compared to prior echocardiogram this may be slightly decreased however his ejection fraction calculated on nuclear stress test in 2010 was 43%. This prompted cardiac catheterization in 2010 which did not show any significant flow limiting CAD.    Hospital Course: 66 year old male with new onset paroxysmal atrial fibrillation following shoulder replacement with symptomatic palpitations following exercise on treadmill. He had no angina. He shortness of breath may have been slightly increased but not significantly. He has baseline COPD. In the emergency department he was given IV diltiazem and an approximately 24-hour is his atrial fibrillation converted to sinus rhythm. He was transition to by mouth diltiazem 120 mg. long-acting. Echocardiogram as described above with mildly reduced/moderately reduced ejection fraction. This is no significant change from prior. He has not had diabetes, no significant hypertension, no prior stroke, he is 66. CHADS - 0. Aspirin only at this point.  He knows to contact me if he begins to feel symptomatic palpitations/atrial fibrillation once again. TSH was normal. Creatinine 0.83, hemoglobin 11.4.   Discharge Exam: Blood pressure 130/68, pulse 66, temperature 98.5 F (36.9 C), temperature source Oral, resp. rate 18, height 6\' 2"  (1.88 m), weight 74.844 kg (165 lb), SpO2 98.00%.    General: Alert and oriented x3 no acute distress, wearing left arm  sling Cardiovascular: Regular rate and rhythm no murmurs rubs or gallops Lungs: Clear to auscultation bilaterally Abdomen: Soft, nontender, positive bowel sounds Extremities: No edema   Labs:   Lab Results  Component Value Date   WBC 6.0 05/10/2012   HGB 11.4* 05/10/2012   HCT 34.6* 05/10/2012   MCV 87.6 05/10/2012   PLT 347 05/10/2012     Lab 05/10/12 0545  NA 140  K 3.5  CL 106  CO2 24  BUN 18  CREATININE 0.83  CALCIUM 9.2  PROT --  BILITOT --  ALKPHOS --  ALT --  AST --  GLUCOSE 101*   Lab Results  Component Value Date   CKTOTAL 50 11/10/2008   CKMB 2.8 01/30/2008   TROPONINI <0.30 05/09/2012    EKG: Atrial fibrillation 127 beats per minute. Telemetry currently sinus rhythm/sinus bradycardia. No significant pauses.   FOLLOW UP PLANS AND APPOINTMENTS     Discharge Orders    Future Appointments: Provider: Department: Dept Phone: Center:   07/10/2012 9:15 AM [name], MD [facility] [phone] None   12/28/2012 9:15 AM Waymon Budge, MD [facility] [phone] None     Future Orders Please Complete By Expires   Diet - low sodium heart healthy      Increase activity slowly          Medication List     As of 05/11/2012  8:26 AM    TAKE these medications         albuterol 108 (90 BASE) MCG/ACT inhaler   Commonly known as: PROVENTIL HFA;VENTOLIN HFA   Inhale 2 puffs into the lungs every 4 (four) hours as needed for wheezing.      allopurinol 100 MG tablet   Commonly known as: ZYLOPRIM   Take 100 mg by mouth daily.  atorvastatin 10 MG tablet   Commonly known as: LIPITOR   Take 10 mg by mouth daily.      beclomethasone 80 MCG/ACT inhaler   Commonly known as: QVAR   Inhale 2 puffs into the lungs 2 (two) times daily.      candesartan 4 MG tablet   Commonly known as: ATACAND   Take 2 mg by mouth daily.      diltiazem 120 MG 24 hr capsule   Commonly known as: CARDIZEM CD   Take 1 capsule (120 mg total) by mouth  daily.      formoterol 12 MCG capsule for inhaler   Commonly known as: FORADIL   Place 1 capsule (12 mcg total) into inhaler and inhale 2 (two) times daily.      isosorbide mononitrate 30 MG 24 hr tablet   Commonly known as: IMDUR   Take 30 mg by mouth daily.      pantoprazole 40 MG tablet   Commonly known as: PROTONIX   Take 1 tablet (40 mg total) by mouth 2 (two) times daily.      predniSONE 10 MG tablet   Commonly known as: DELTASONE   Take 1 tablet (10 mg total) by mouth daily.        Follow-up Information    Follow up with Donato Schultz, MD. On 05/26/2012. (3pm)    Contact information:   [address] [place] [address] [phone]          BRING ALL MEDICATIONS WITH YOU TO FOLLOW UP APPOINTMENTS  Time spent with patient to include physician time: 25 minutes  Signed: Donato Schultz 05/11/2012, 8:26 AM

## 2012-05-11 NOTE — Progress Notes (Signed)
Reviewed discharge instructions with patient and wife they stated their understanding.  Patient discharged via wheelchair home with wife.  Levi [name]

## 2012-05-12 ENCOUNTER — Ambulatory Visit (INDEPENDENT_AMBULATORY_CARE_PROVIDER_SITE_OTHER): Payer: Medicare Other

## 2012-05-12 DIAGNOSIS — J309 Allergic rhinitis, unspecified: Secondary | ICD-10-CM

## 2012-05-18 ENCOUNTER — Ambulatory Visit (INDEPENDENT_AMBULATORY_CARE_PROVIDER_SITE_OTHER): Payer: Medicare Other

## 2012-05-18 DIAGNOSIS — J309 Allergic rhinitis, unspecified: Secondary | ICD-10-CM | POA: Diagnosis not present

## 2012-05-22 DIAGNOSIS — I2510 Atherosclerotic heart disease of native coronary artery without angina pectoris: Secondary | ICD-10-CM | POA: Diagnosis not present

## 2012-05-22 DIAGNOSIS — I4891 Unspecified atrial fibrillation: Secondary | ICD-10-CM | POA: Diagnosis not present

## 2012-05-22 DIAGNOSIS — I519 Heart disease, unspecified: Secondary | ICD-10-CM | POA: Diagnosis not present

## 2012-05-25 ENCOUNTER — Ambulatory Visit (INDEPENDENT_AMBULATORY_CARE_PROVIDER_SITE_OTHER): Payer: Medicare Other

## 2012-05-25 DIAGNOSIS — J309 Allergic rhinitis, unspecified: Secondary | ICD-10-CM | POA: Diagnosis not present

## 2012-05-26 DIAGNOSIS — I519 Heart disease, unspecified: Secondary | ICD-10-CM | POA: Diagnosis not present

## 2012-05-26 DIAGNOSIS — I4891 Unspecified atrial fibrillation: Secondary | ICD-10-CM | POA: Diagnosis not present

## 2012-06-01 ENCOUNTER — Ambulatory Visit (INDEPENDENT_AMBULATORY_CARE_PROVIDER_SITE_OTHER): Payer: Medicare Other

## 2012-06-01 DIAGNOSIS — J309 Allergic rhinitis, unspecified: Secondary | ICD-10-CM | POA: Diagnosis not present

## 2012-06-08 ENCOUNTER — Ambulatory Visit (INDEPENDENT_AMBULATORY_CARE_PROVIDER_SITE_OTHER): Payer: Medicare Other

## 2012-06-08 DIAGNOSIS — J309 Allergic rhinitis, unspecified: Secondary | ICD-10-CM | POA: Diagnosis not present

## 2012-06-08 DIAGNOSIS — M19019 Primary osteoarthritis, unspecified shoulder: Secondary | ICD-10-CM | POA: Diagnosis not present

## 2012-06-15 ENCOUNTER — Ambulatory Visit (INDEPENDENT_AMBULATORY_CARE_PROVIDER_SITE_OTHER): Payer: Medicare Other

## 2012-06-15 DIAGNOSIS — J309 Allergic rhinitis, unspecified: Secondary | ICD-10-CM

## 2012-06-22 ENCOUNTER — Ambulatory Visit: Payer: Medicare Other

## 2012-06-23 ENCOUNTER — Ambulatory Visit (INDEPENDENT_AMBULATORY_CARE_PROVIDER_SITE_OTHER): Payer: Medicare Other

## 2012-06-23 DIAGNOSIS — J309 Allergic rhinitis, unspecified: Secondary | ICD-10-CM | POA: Diagnosis not present

## 2012-06-25 DIAGNOSIS — M48061 Spinal stenosis, lumbar region without neurogenic claudication: Secondary | ICD-10-CM | POA: Diagnosis not present

## 2012-06-25 DIAGNOSIS — M47817 Spondylosis without myelopathy or radiculopathy, lumbosacral region: Secondary | ICD-10-CM | POA: Diagnosis not present

## 2012-06-25 DIAGNOSIS — M4802 Spinal stenosis, cervical region: Secondary | ICD-10-CM | POA: Diagnosis not present

## 2012-06-29 ENCOUNTER — Other Ambulatory Visit: Payer: Self-pay | Admitting: Neurological Surgery

## 2012-06-29 ENCOUNTER — Ambulatory Visit (INDEPENDENT_AMBULATORY_CARE_PROVIDER_SITE_OTHER): Payer: Medicare Other

## 2012-06-29 ENCOUNTER — Ambulatory Visit: Payer: Medicare Other

## 2012-06-29 DIAGNOSIS — N882 Stricture and stenosis of cervix uteri: Secondary | ICD-10-CM

## 2012-06-29 DIAGNOSIS — J309 Allergic rhinitis, unspecified: Secondary | ICD-10-CM | POA: Diagnosis not present

## 2012-06-29 DIAGNOSIS — M47812 Spondylosis without myelopathy or radiculopathy, cervical region: Secondary | ICD-10-CM

## 2012-07-01 ENCOUNTER — Ambulatory Visit
Admission: RE | Admit: 2012-07-01 | Discharge: 2012-07-01 | Disposition: A | Discharge status: Home / Self Care | Payer: Medicare Other | Source: Ambulatory Visit | Attending: Neurological Surgery | Admitting: Neurological Surgery

## 2012-07-01 DIAGNOSIS — M5020 Other cervical disc displacement, unspecified cervical region: Secondary | ICD-10-CM | POA: Diagnosis not present

## 2012-07-01 DIAGNOSIS — N882 Stricture and stenosis of cervix uteri: Secondary | ICD-10-CM

## 2012-07-01 DIAGNOSIS — M47812 Spondylosis without myelopathy or radiculopathy, cervical region: Secondary | ICD-10-CM

## 2012-07-06 ENCOUNTER — Ambulatory Visit: Payer: Medicare Other

## 2012-07-06 ENCOUNTER — Ambulatory Visit (INDEPENDENT_AMBULATORY_CARE_PROVIDER_SITE_OTHER): Payer: Medicare Other

## 2012-07-06 ENCOUNTER — Other Ambulatory Visit: Payer: Self-pay | Admitting: Neurological Surgery

## 2012-07-06 DIAGNOSIS — J309 Allergic rhinitis, unspecified: Secondary | ICD-10-CM | POA: Diagnosis not present

## 2012-07-06 DIAGNOSIS — M47812 Spondylosis without myelopathy or radiculopathy, cervical region: Secondary | ICD-10-CM | POA: Diagnosis not present

## 2012-07-06 DIAGNOSIS — M5412 Radiculopathy, cervical region: Secondary | ICD-10-CM | POA: Diagnosis not present

## 2012-07-06 DIAGNOSIS — M4802 Spinal stenosis, cervical region: Secondary | ICD-10-CM | POA: Diagnosis not present

## 2012-07-08 ENCOUNTER — Encounter: Payer: Self-pay | Admitting: Internal Medicine

## 2012-07-10 ENCOUNTER — Ambulatory Visit: Payer: BC Managed Care – PPO | Admitting: Pulmonary Disease

## 2012-07-13 ENCOUNTER — Ambulatory Visit (INDEPENDENT_AMBULATORY_CARE_PROVIDER_SITE_OTHER): Payer: Medicare Other

## 2012-07-13 ENCOUNTER — Ambulatory Visit: Payer: Medicare Other

## 2012-07-13 ENCOUNTER — Encounter (HOSPITAL_COMMUNITY): Payer: Self-pay | Admitting: Pharmacy Technician

## 2012-07-13 DIAGNOSIS — J309 Allergic rhinitis, unspecified: Secondary | ICD-10-CM | POA: Diagnosis not present

## 2012-07-14 ENCOUNTER — Telehealth: Payer: Self-pay | Admitting: Pulmonary Disease

## 2012-07-14 NOTE — Telephone Encounter (Signed)
[name] is aware. She was very Adult nurse.

## 2012-07-14 NOTE — Telephone Encounter (Signed)
I spoke with spouse. She stated spouse is going to have cervical disc surgery next Tuesday by dr. Danielle Dess. Spouse wanted to know if [facility] was aware of this. She was not sure if Dr. Danielle Dess sent [facility] any notes regarding this or note. Please advise thanks

## 2012-07-14 NOTE — Telephone Encounter (Signed)
Let them know I am aware now, and either I or one of my partners who are hospital based can see him if he has any breathing issues.

## 2012-07-16 ENCOUNTER — Encounter (HOSPITAL_COMMUNITY): Payer: Self-pay

## 2012-07-16 ENCOUNTER — Encounter (HOSPITAL_COMMUNITY)
Admission: RE | Admit: 2012-07-16 | Discharge: 2012-07-16 | Disposition: A | Discharge status: Home / Self Care | Payer: Medicare Other | Source: Ambulatory Visit | Attending: Neurological Surgery | Admitting: Neurological Surgery

## 2012-07-16 DIAGNOSIS — IMO0002 Reserved for concepts with insufficient information to code with codable children: Secondary | ICD-10-CM | POA: Diagnosis not present

## 2012-07-16 DIAGNOSIS — K5730 Diverticulosis of large intestine without perforation or abscess without bleeding: Secondary | ICD-10-CM | POA: Diagnosis present

## 2012-07-16 DIAGNOSIS — M47812 Spondylosis without myelopathy or radiculopathy, cervical region: Secondary | ICD-10-CM | POA: Diagnosis not present

## 2012-07-16 DIAGNOSIS — I4891 Unspecified atrial fibrillation: Secondary | ICD-10-CM | POA: Diagnosis present

## 2012-07-16 DIAGNOSIS — K449 Diaphragmatic hernia without obstruction or gangrene: Secondary | ICD-10-CM | POA: Diagnosis present

## 2012-07-16 DIAGNOSIS — M4802 Spinal stenosis, cervical region: Secondary | ICD-10-CM | POA: Diagnosis not present

## 2012-07-16 DIAGNOSIS — M5382 Other specified dorsopathies, cervical region: Secondary | ICD-10-CM | POA: Diagnosis not present

## 2012-07-16 DIAGNOSIS — M5412 Radiculopathy, cervical region: Secondary | ICD-10-CM | POA: Diagnosis not present

## 2012-07-16 DIAGNOSIS — J309 Allergic rhinitis, unspecified: Secondary | ICD-10-CM | POA: Diagnosis not present

## 2012-07-16 DIAGNOSIS — J4489 Other specified chronic obstructive pulmonary disease: Secondary | ICD-10-CM | POA: Diagnosis not present

## 2012-07-16 DIAGNOSIS — K219 Gastro-esophageal reflux disease without esophagitis: Secondary | ICD-10-CM | POA: Diagnosis not present

## 2012-07-16 DIAGNOSIS — M19019 Primary osteoarthritis, unspecified shoulder: Secondary | ICD-10-CM | POA: Diagnosis not present

## 2012-07-16 DIAGNOSIS — J45909 Unspecified asthma, uncomplicated: Secondary | ICD-10-CM | POA: Diagnosis not present

## 2012-07-16 DIAGNOSIS — E785 Hyperlipidemia, unspecified: Secondary | ICD-10-CM | POA: Diagnosis not present

## 2012-07-16 DIAGNOSIS — Z79899 Other long term (current) drug therapy: Secondary | ICD-10-CM | POA: Diagnosis not present

## 2012-07-16 DIAGNOSIS — J449 Chronic obstructive pulmonary disease, unspecified: Secondary | ICD-10-CM | POA: Diagnosis not present

## 2012-07-16 DIAGNOSIS — M129 Arthropathy, unspecified: Secondary | ICD-10-CM | POA: Diagnosis present

## 2012-07-16 DIAGNOSIS — M5030 Other cervical disc degeneration, unspecified cervical region: Secondary | ICD-10-CM | POA: Diagnosis not present

## 2012-07-16 HISTORY — DX: Cardiac arrhythmia, unspecified: I49.9

## 2012-07-16 LAB — BASIC METABOLIC PANEL
BUN: 21 mg/dL (ref 6–23)
CO2: 28 mEq/L (ref 19–32)
Calcium: 10.1 mg/dL (ref 8.4–10.5)
Chloride: 106 mEq/L (ref 96–112)
Creatinine, Ser: 0.83 mg/dL (ref 0.50–1.35)
GFR calc Af Amer: >90 mL/min (ref >90)
GFR calc non Af Amer: >90 mL/min (ref >90)
Glucose, Bld: 103 mg/dL — ABNORMAL HIGH (ref 70–99)
Potassium: 4.2 mEq/L (ref 3.5–5.1)
Sodium: 143 mEq/L (ref 135–145)

## 2012-07-16 LAB — SURGICAL PCR SCREEN
MRSA, PCR: NEGATIVE
Staphylococcus aureus: NEGATIVE

## 2012-07-16 LAB — CBC
HCT: 37.3 % — ABNORMAL LOW (ref 39.0–52.0)
Hemoglobin: 12.4 g/dL — ABNORMAL LOW (ref 13.0–17.0)
MCH: 28.6 pg (ref 26.0–34.0)
MCHC: 33.2 g/dL (ref 30.0–36.0)
MCV: 86.1 fL (ref 78.0–100.0)
Platelets: 225 10*3/uL (ref 150–400)
RBC: 4.33 MIL/uL (ref 4.22–5.81)
RDW: 15.6 % — ABNORMAL HIGH (ref 11.5–15.5)
WBC: 8 10*3/uL (ref 4.0–10.5)

## 2012-07-16 NOTE — Pre-Procedure Instructions (Signed)
Levi [name]  07/16/2012   Your procedure is scheduled on:  Jul 21, 2012  Report to [facility] at 8:30 AM.  Call this number if you have problems the morning of surgery: [phone]   Remember:   Do not eat food or drink liquids after midnight.   Take these medicines the morning of surgery with A SIP OF WATER: inhaler as needed(bring with you if needed day of surgery), allopurinol, diltiazem(cardizem), (imdur)isosorbide, pantoprazole(protonix)    Do not wear jewelry, make-up or nail polish.  Do not wear lotions, powders, or perfumes. You may wear deodorant.  Do not shave 48 hours prior to surgery. Men may shave face and neck.  Do not bring valuables to the hospital.  Contacts, dentures or bridgework may not be worn into surgery.  Leave suitcase in the car. After surgery it may be brought to your room.  For patients admitted to the hospital, checkout time is 11:00 AM the day of  discharge.   Patients discharged the day of surgery will not be allowed to drive  home.  Name and phone number of your driver:   Special Instructions: Shower using CHG 2 nights before surgery and the night before surgery.  If you shower the day of surgery use CHG.  Use special wash - you have one bottle of CHG for all showers.  You should use approximately 1/3 of the bottle for each shower.   Please read over the following fact sheets that you were given: Pain Booklet, Coughing and Deep Breathing and Surgical Site Infection Prevention

## 2012-07-16 NOTE — Progress Notes (Signed)
Notified Jessica in office orders not signed, labs drawn per anesthesia.

## 2012-07-20 ENCOUNTER — Ambulatory Visit: Payer: Medicare Other

## 2012-07-20 ENCOUNTER — Ambulatory Visit (INDEPENDENT_AMBULATORY_CARE_PROVIDER_SITE_OTHER): Payer: Medicare Other

## 2012-07-20 DIAGNOSIS — M19019 Primary osteoarthritis, unspecified shoulder: Secondary | ICD-10-CM | POA: Diagnosis not present

## 2012-07-20 DIAGNOSIS — J309 Allergic rhinitis, unspecified: Secondary | ICD-10-CM

## 2012-07-20 MED ORDER — CEFAZOLIN SODIUM-DEXTROSE 2-3 GM-% IV SOLR
2.0000 g | INTRAVENOUS | Status: AC
  Administered 2012-07-21: 2 g via INTRAVENOUS
  Filled 2012-07-20: qty 50

## 2012-07-21 ENCOUNTER — Encounter (HOSPITAL_COMMUNITY)
Admission: RE | Disposition: A | Discharge status: Home / Self Care | Payer: Self-pay | Source: Ambulatory Visit | Attending: Neurological Surgery

## 2012-07-21 ENCOUNTER — Encounter (HOSPITAL_COMMUNITY): Payer: Self-pay | Admitting: *Deleted

## 2012-07-21 ENCOUNTER — Inpatient Hospital Stay (HOSPITAL_COMMUNITY)
Admission: RE | Admit: 2012-07-21 | Discharge: 2012-07-22 | DRG: 473 | Disposition: A | Discharge status: Home / Self Care | Payer: Medicare Other | Source: Ambulatory Visit | Attending: Neurological Surgery | Admitting: Neurological Surgery

## 2012-07-21 ENCOUNTER — Ambulatory Visit (HOSPITAL_COMMUNITY): Payer: Medicare Other

## 2012-07-21 ENCOUNTER — Encounter (HOSPITAL_COMMUNITY): Payer: Self-pay | Admitting: Anesthesiology

## 2012-07-21 ENCOUNTER — Ambulatory Visit (HOSPITAL_COMMUNITY): Payer: Medicare Other | Admitting: Anesthesiology

## 2012-07-21 DIAGNOSIS — M47812 Spondylosis without myelopathy or radiculopathy, cervical region: Secondary | ICD-10-CM | POA: Diagnosis not present

## 2012-07-21 DIAGNOSIS — K449 Diaphragmatic hernia without obstruction or gangrene: Secondary | ICD-10-CM | POA: Diagnosis present

## 2012-07-21 DIAGNOSIS — E785 Hyperlipidemia, unspecified: Secondary | ICD-10-CM | POA: Diagnosis present

## 2012-07-21 DIAGNOSIS — M5030 Other cervical disc degeneration, unspecified cervical region: Secondary | ICD-10-CM | POA: Diagnosis present

## 2012-07-21 DIAGNOSIS — M5412 Radiculopathy, cervical region: Secondary | ICD-10-CM | POA: Diagnosis not present

## 2012-07-21 DIAGNOSIS — K219 Gastro-esophageal reflux disease without esophagitis: Secondary | ICD-10-CM | POA: Diagnosis not present

## 2012-07-21 DIAGNOSIS — M4722 Other spondylosis with radiculopathy, cervical region: Secondary | ICD-10-CM

## 2012-07-21 DIAGNOSIS — IMO0002 Reserved for concepts with insufficient information to code with codable children: Secondary | ICD-10-CM

## 2012-07-21 DIAGNOSIS — J45909 Unspecified asthma, uncomplicated: Secondary | ICD-10-CM | POA: Diagnosis not present

## 2012-07-21 DIAGNOSIS — K5730 Diverticulosis of large intestine without perforation or abscess without bleeding: Secondary | ICD-10-CM | POA: Diagnosis present

## 2012-07-21 DIAGNOSIS — I4891 Unspecified atrial fibrillation: Secondary | ICD-10-CM | POA: Diagnosis present

## 2012-07-21 DIAGNOSIS — M129 Arthropathy, unspecified: Secondary | ICD-10-CM | POA: Diagnosis present

## 2012-07-21 DIAGNOSIS — J449 Chronic obstructive pulmonary disease, unspecified: Secondary | ICD-10-CM | POA: Diagnosis not present

## 2012-07-21 DIAGNOSIS — J4489 Other specified chronic obstructive pulmonary disease: Secondary | ICD-10-CM | POA: Diagnosis present

## 2012-07-21 DIAGNOSIS — M5382 Other specified dorsopathies, cervical region: Secondary | ICD-10-CM | POA: Diagnosis not present

## 2012-07-21 DIAGNOSIS — M4802 Spinal stenosis, cervical region: Secondary | ICD-10-CM | POA: Diagnosis not present

## 2012-07-21 DIAGNOSIS — Z79899 Other long term (current) drug therapy: Secondary | ICD-10-CM

## 2012-07-21 HISTORY — PX: ANTERIOR CERVICAL DECOMP/DISCECTOMY FUSION: SHX1161

## 2012-07-21 SURGERY — ANTERIOR CERVICAL DECOMPRESSION/DISCECTOMY FUSION 2 LEVELS
Anesthesia: General | Site: Neck | Wound class: Clean

## 2012-07-21 MED ORDER — SALMETEROL XINAFOATE 50 MCG/DOSE IN AEPB
1.0000 | INHALATION_SPRAY | Freq: Two times a day (BID) | RESPIRATORY_TRACT | Status: DC
  Administered 2012-07-21 – 2012-07-22 (×2): 1 via RESPIRATORY_TRACT
  Filled 2012-07-21: qty 0

## 2012-07-21 MED ORDER — BACITRACIN 50000 UNITS IM SOLR
INTRAMUSCULAR | Status: AC
  Filled 2012-07-21: qty 1

## 2012-07-21 MED ORDER — LIDOCAINE HCL 4 % MT SOLN
OROMUCOSAL | Status: DC | PRN
  Administered 2012-07-21: 4 mL via TOPICAL

## 2012-07-21 MED ORDER — ONDANSETRON HCL 4 MG/2ML IJ SOLN: 4.0000 mg | Freq: Once | INTRAMUSCULAR | Status: DC | PRN

## 2012-07-21 MED ORDER — SODIUM CHLORIDE 0.9 % IV SOLN: 250.0000 mL | INTRAVENOUS | Status: DC

## 2012-07-21 MED ORDER — HYDROMORPHONE HCL PF 1 MG/ML IJ SOLN
INTRAMUSCULAR | Status: AC
  Filled 2012-07-21: qty 1

## 2012-07-21 MED ORDER — BUPIVACAINE HCL (PF) 0.5 % IJ SOLN
INTRAMUSCULAR | Status: DC | PRN
  Administered 2012-07-21: 2 mL

## 2012-07-21 MED ORDER — ACETAMINOPHEN 650 MG RE SUPP: 650.0000 mg | RECTAL | Status: DC | PRN

## 2012-07-21 MED ORDER — FLUTICASONE PROPIONATE HFA 44 MCG/ACT IN AERO
1.0000 | INHALATION_SPRAY | Freq: Two times a day (BID) | RESPIRATORY_TRACT | Status: DC
  Administered 2012-07-21 – 2012-07-22 (×2): 1 via RESPIRATORY_TRACT
  Filled 2012-07-21: qty 10.6

## 2012-07-21 MED ORDER — ACETAMINOPHEN 325 MG PO TABS: 650.0000 mg | ORAL_TABLET | ORAL | Status: DC | PRN

## 2012-07-21 MED ORDER — MENTHOL 3 MG MT LOZG
1.0000 | LOZENGE | OROMUCOSAL | Status: DC | PRN
  Administered 2012-07-21: 3 mg via ORAL
  Filled 2012-07-21: qty 9

## 2012-07-21 MED ORDER — PROPOFOL 10 MG/ML IV BOLUS
INTRAVENOUS | Status: DC | PRN
  Administered 2012-07-21: 200 mg via INTRAVENOUS

## 2012-07-21 MED ORDER — PHENOL 1.4 % MT LIQD: 1.0000 | OROMUCOSAL | Status: DC | PRN

## 2012-07-21 MED ORDER — DILTIAZEM HCL ER COATED BEADS 120 MG PO CP24
120.0000 mg | ORAL_CAPSULE | Freq: Every day | ORAL | Status: DC
  Filled 2012-07-21 (×2): qty 1

## 2012-07-21 MED ORDER — SODIUM CHLORIDE 0.9 % IR SOLN
Status: DC | PRN
  Administered 2012-07-21: 12:00:00

## 2012-07-21 MED ORDER — HYDROMORPHONE HCL PF 1 MG/ML IJ SOLN
0.2500 mg | INTRAMUSCULAR | Status: DC | PRN
  Administered 2012-07-21 (×4): 0.5 mg via INTRAVENOUS

## 2012-07-21 MED ORDER — CEFAZOLIN SODIUM 1-5 GM-% IV SOLN
1.0000 g | Freq: Three times a day (TID) | INTRAVENOUS | Status: AC
  Administered 2012-07-21 – 2012-07-22 (×2): 1 g via INTRAVENOUS
  Filled 2012-07-21 (×3): qty 50

## 2012-07-21 MED ORDER — IRBESARTAN 75 MG PO TABS
75.0000 mg | ORAL_TABLET | Freq: Every day | ORAL | Status: DC
  Administered 2012-07-21: 75 mg via ORAL
  Filled 2012-07-21 (×2): qty 1

## 2012-07-21 MED ORDER — THROMBIN 5000 UNITS EX KIT
PACK | CUTANEOUS | Status: DC | PRN
  Administered 2012-07-21 (×2): 5000 [IU] via TOPICAL

## 2012-07-21 MED ORDER — HEMOSTATIC AGENTS (NO CHARGE) OPTIME
TOPICAL | Status: DC | PRN
  Administered 2012-07-21: 1 via TOPICAL

## 2012-07-21 MED ORDER — 0.9 % SODIUM CHLORIDE (POUR BTL) OPTIME
TOPICAL | Status: DC | PRN
  Administered 2012-07-21: 1000 mL

## 2012-07-21 MED ORDER — ROCURONIUM BROMIDE 100 MG/10ML IV SOLN
INTRAVENOUS | Status: DC | PRN
  Administered 2012-07-21: 50 mg via INTRAVENOUS
  Administered 2012-07-21 (×2): 10 mg via INTRAVENOUS

## 2012-07-21 MED ORDER — LACTATED RINGERS IV SOLN
INTRAVENOUS | Status: DC | PRN
  Administered 2012-07-21 (×2): via INTRAVENOUS

## 2012-07-21 MED ORDER — MORPHINE SULFATE 2 MG/ML IJ SOLN
1.0000 mg | INTRAMUSCULAR | Status: DC | PRN
Start: 2012-07-21 — End: 2012-07-22

## 2012-07-21 MED ORDER — METHOCARBAMOL 100 MG/ML IJ SOLN
500.0000 mg | Freq: Four times a day (QID) | INTRAVENOUS | Status: DC | PRN
  Filled 2012-07-21: qty 5

## 2012-07-21 MED ORDER — ALLOPURINOL 100 MG PO TABS
100.0000 mg | ORAL_TABLET | Freq: Every day | ORAL | Status: DC
  Filled 2012-07-21 (×2): qty 1

## 2012-07-21 MED ORDER — ALBUTEROL SULFATE HFA 108 (90 BASE) MCG/ACT IN AERS
2.0000 | INHALATION_SPRAY | RESPIRATORY_TRACT | Status: DC | PRN
  Administered 2012-07-21: 2 via RESPIRATORY_TRACT
  Filled 2012-07-21: qty 6.7

## 2012-07-21 MED ORDER — METHOCARBAMOL 500 MG PO TABS
500.0000 mg | ORAL_TABLET | Freq: Four times a day (QID) | ORAL | Status: DC | PRN
  Administered 2012-07-21: 500 mg via ORAL
  Filled 2012-07-21: qty 1

## 2012-07-21 MED ORDER — OXYCODONE-ACETAMINOPHEN 5-325 MG PO TABS
1.0000 | ORAL_TABLET | ORAL | Status: DC | PRN
Start: 2012-07-21 — End: 2012-07-22
  Administered 2012-07-21: 2 via ORAL
  Filled 2012-07-21: qty 2

## 2012-07-21 MED ORDER — SODIUM CHLORIDE 0.9 % IV SOLN
INTRAVENOUS | Status: AC
  Filled 2012-07-21: qty 500

## 2012-07-21 MED ORDER — SODIUM CHLORIDE 0.9 % IJ SOLN
3.0000 mL | Freq: Two times a day (BID) | INTRAMUSCULAR | Status: DC
  Administered 2012-07-21: 3 mL via INTRAVENOUS

## 2012-07-21 MED ORDER — MIDAZOLAM HCL 5 MG/5ML IJ SOLN
INTRAMUSCULAR | Status: DC | PRN
  Administered 2012-07-21: 2 mg via INTRAVENOUS

## 2012-07-21 MED ORDER — ONDANSETRON HCL 4 MG/2ML IJ SOLN
INTRAMUSCULAR | Status: DC | PRN
  Administered 2012-07-21: 4 mg via INTRAVENOUS

## 2012-07-21 MED ORDER — ONDANSETRON HCL 4 MG/2ML IJ SOLN: 4.0000 mg | INTRAMUSCULAR | Status: DC | PRN

## 2012-07-21 MED ORDER — LIDOCAINE-EPINEPHRINE 1 %-1:100000 IJ SOLN
INTRAMUSCULAR | Status: DC | PRN
  Administered 2012-07-21: 2 mL

## 2012-07-21 MED ORDER — FENTANYL CITRATE 0.05 MG/ML IJ SOLN
INTRAMUSCULAR | Status: DC | PRN
  Administered 2012-07-21: 150 ug via INTRAVENOUS
  Administered 2012-07-21 (×2): 50 ug via INTRAVENOUS

## 2012-07-21 MED ORDER — EPHEDRINE SULFATE 50 MG/ML IJ SOLN
INTRAMUSCULAR | Status: DC | PRN
  Administered 2012-07-21 (×4): 10 mg via INTRAVENOUS

## 2012-07-21 MED ORDER — LIDOCAINE HCL (CARDIAC) 20 MG/ML IV SOLN
INTRAVENOUS | Status: DC | PRN
  Administered 2012-07-21: 70 mg via INTRAVENOUS

## 2012-07-21 MED ORDER — SODIUM CHLORIDE 0.9 % IJ SOLN: 3.0000 mL | INTRAMUSCULAR | Status: DC | PRN

## 2012-07-21 MED ORDER — DEXAMETHASONE SODIUM PHOSPHATE 10 MG/ML IJ SOLN
INTRAMUSCULAR | Status: AC
  Administered 2012-07-21: 10 mg via INTRAVENOUS
  Filled 2012-07-21: qty 1

## 2012-07-21 MED ORDER — ATORVASTATIN CALCIUM 10 MG PO TABS
10.0000 mg | ORAL_TABLET | Freq: Every day | ORAL | Status: DC
  Filled 2012-07-21 (×2): qty 1

## 2012-07-21 MED ORDER — PANTOPRAZOLE SODIUM 40 MG PO TBEC
40.0000 mg | DELAYED_RELEASE_TABLET | Freq: Two times a day (BID) | ORAL | Status: DC
  Administered 2012-07-21: 40 mg via ORAL
  Filled 2012-07-21 (×2): qty 1

## 2012-07-21 MED ORDER — PREDNISONE 10 MG PO TABS
10.0000 mg | ORAL_TABLET | Freq: Every day | ORAL | Status: DC
  Filled 2012-07-21 (×2): qty 1

## 2012-07-21 MED ORDER — GLYCOPYRROLATE 0.2 MG/ML IJ SOLN
INTRAMUSCULAR | Status: DC | PRN
  Administered 2012-07-21: 0.2 mg via INTRAVENOUS
  Administered 2012-07-21: 0.6 mg via INTRAVENOUS
  Administered 2012-07-21: 0.2 mg via INTRAVENOUS

## 2012-07-21 MED ORDER — ISOSORBIDE MONONITRATE ER 30 MG PO TB24
30.0000 mg | ORAL_TABLET | Freq: Every day | ORAL | Status: DC
  Filled 2012-07-21 (×2): qty 1

## 2012-07-21 MED ORDER — NEOSTIGMINE METHYLSULFATE 1 MG/ML IJ SOLN
INTRAMUSCULAR | Status: DC | PRN
  Administered 2012-07-21: 3 mg via INTRAVENOUS

## 2012-07-21 MED FILL — Thrombin For Soln 5000 Unit: CUTANEOUS | Qty: 5000 | Status: AC

## 2012-07-21 SURGICAL SUPPLY — 53 items
ADH SKN CLS APL DERMABOND .7 (GAUZE/BANDAGES/DRESSINGS) ×1
ALLOGRAFT CERVICAL 6MM PAR (Bone Implant) ×2 IMPLANT
BAG DECANTER FOR FLEXI CONT (MISCELLANEOUS) ×2 IMPLANT
BANDAGE GAUZE ELAST BULKY 4 IN (GAUZE/BANDAGES/DRESSINGS) IMPLANT
BIT DRILL NEURO 2X3.1 SFT TUCH (MISCELLANEOUS) ×1 IMPLANT
BUR BARREL STRAIGHT FLUTE 4.0 (BURR) ×2 IMPLANT
CANISTER SUCTION 2500CC (MISCELLANEOUS) ×2 IMPLANT
CLOTH BEACON ORANGE TIMEOUT ST (SAFETY) ×2 IMPLANT
CONT SPEC 4OZ CLIKSEAL STRL BL (MISCELLANEOUS) ×2 IMPLANT
DECANTER SPIKE VIAL GLASS SM (MISCELLANEOUS) ×2 IMPLANT
DERMABOND ADVANCED (GAUZE/BANDAGES/DRESSINGS) ×1
DERMABOND ADVANCED .7 DNX12 (GAUZE/BANDAGES/DRESSINGS) ×1 IMPLANT
DRAPE LAPAROTOMY 100X72 PEDS (DRAPES) ×2 IMPLANT
DRAPE MICROSCOPE LEICA (MISCELLANEOUS) IMPLANT
DRAPE POUCH INSTRU U-SHP 10X18 (DRAPES) ×2 IMPLANT
DRILL NEURO 2X3.1 SOFT TOUCH (MISCELLANEOUS) ×2
DURAPREP 6ML APPLICATOR 50/CS (WOUND CARE) ×2 IMPLANT
ELECT REM PT RETURN 9FT ADLT (ELECTROSURGICAL) ×2
ELECTRODE REM PT RTRN 9FT ADLT (ELECTROSURGICAL) ×1 IMPLANT
GAUZE SPONGE 4X4 16PLY XRAY LF (GAUZE/BANDAGES/DRESSINGS) IMPLANT
GLOVE BIOGEL PI IND STRL 7.5 (GLOVE) IMPLANT
GLOVE BIOGEL PI IND STRL 8.5 (GLOVE) ×1 IMPLANT
GLOVE BIOGEL PI INDICATOR 7.5 (GLOVE) ×1
GLOVE BIOGEL PI INDICATOR 8.5 (GLOVE) ×3
GLOVE ECLIPSE 7.5 STRL STRAW (GLOVE) ×2 IMPLANT
GLOVE ECLIPSE 8.5 STRL (GLOVE) ×2 IMPLANT
GLOVE EXAM NITRILE LRG STRL (GLOVE) IMPLANT
GLOVE EXAM NITRILE MD LF STRL (GLOVE) IMPLANT
GLOVE EXAM NITRILE XL STR (GLOVE) IMPLANT
GLOVE EXAM NITRILE XS STR PU (GLOVE) IMPLANT
GLOVE SURG SS PI 8.0 STRL IVOR (GLOVE) ×3 IMPLANT
GOWN BRE IMP SLV AUR LG STRL (GOWN DISPOSABLE) ×1 IMPLANT
GOWN BRE IMP SLV AUR XL STRL (GOWN DISPOSABLE) ×1 IMPLANT
GOWN STRL REIN 2XL LVL4 (GOWN DISPOSABLE) ×4 IMPLANT
HEAD HALTER (SOFTGOODS) ×2 IMPLANT
KIT BASIN OR (CUSTOM PROCEDURE TRAY) ×2 IMPLANT
KIT ROOM TURNOVER OR (KITS) ×2 IMPLANT
NDL SPNL 22GX3.5 QUINCKE BK (NEEDLE) ×1 IMPLANT
NEEDLE HYPO 22GX1.5 SAFETY (NEEDLE) ×2 IMPLANT
NEEDLE SPNL 22GX3.5 QUINCKE BK (NEEDLE) ×2 IMPLANT
NS IRRIG 1000ML POUR BTL (IV SOLUTION) ×2 IMPLANT
PACK LAMINECTOMY NEURO (CUSTOM PROCEDURE TRAY) ×2 IMPLANT
PAD ARMBOARD 7.5X6 YLW CONV (MISCELLANEOUS) ×6 IMPLANT
PUTTY BONE 2.5CC ×1 IMPLANT
RUBBERBAND STERILE (MISCELLANEOUS) IMPLANT
SPONGE GAUZE 4X4 12PLY (GAUZE/BANDAGES/DRESSINGS) ×2 IMPLANT
SPONGE INTESTINAL PEANUT (DISPOSABLE) ×2 IMPLANT
SPONGE SURGIFOAM ABS GEL SZ50 (HEMOSTASIS) ×2 IMPLANT
SUT VIC AB 3-0 SH 8-18 (SUTURE) ×4 IMPLANT
SYR 20ML ECCENTRIC (SYRINGE) ×2 IMPLANT
TOWEL OR 17X24 6PK STRL BLUE (TOWEL DISPOSABLE) ×2 IMPLANT
TOWEL OR 17X26 10 PK STRL BLUE (TOWEL DISPOSABLE) ×2 IMPLANT
WATER STERILE IRR 1000ML POUR (IV SOLUTION) ×2 IMPLANT

## 2012-07-21 NOTE — Anesthesia Preprocedure Evaluation (Addendum)
Anesthesia Evaluation  Patient identified by MRN, date of birth, ID band Patient awake    Reviewed: Allergy & Precautions, H&P , NPO status , Patient's Chart, lab work & pertinent test results  Airway Mallampati: I TM Distance: >3 FB Neck ROM: full    Dental  (+) Teeth Intact and Caps   Pulmonary asthma , COPD COPD inhaler,          Cardiovascular + dysrhythmias Atrial Fibrillation Rhythm:regular Rate:Normal     Neuro/Psych    GI/Hepatic hiatal hernia, GERD-  ,  Endo/Other    Renal/GU      Musculoskeletal  (+) Arthritis -,   Abdominal   Peds  Hematology   Anesthesia Other Findings   Reproductive/Obstetrics                          Anesthesia Physical Anesthesia Plan  ASA: II  Anesthesia Plan: General   Post-op Pain Management:    Induction: Intravenous  Airway Management Planned: Oral ETT  Additional Equipment:   Intra-op Plan:   Post-operative Plan: Extubation in OR  Informed Consent: I have reviewed the patients History and Physical, chart, labs and discussed the procedure including the risks, benefits and alternatives for the proposed anesthesia with the patient or authorized representative who has indicated his/her understanding and acceptance.     Plan Discussed with: CRNA, Anesthesiologist and Surgeon  Anesthesia Plan Comments:         Anesthesia Quick Evaluation

## 2012-07-21 NOTE — Progress Notes (Signed)
UR COMPLETED  

## 2012-07-21 NOTE — Progress Notes (Signed)
Patient ID: Levi [name], male   DOB: January 28, 1946, 67 y.o.   MRN: [id number] Vital signs are stable. Motor function is good in both upper extremities. Incision is clean and dry. Patient is able to swallow. Stable

## 2012-07-21 NOTE — Transfer of Care (Signed)
Immediate Anesthesia Transfer of Care Note  Patient: Levi [name]  Procedure(s) Performed: Procedure(s) with comments: ANTERIOR CERVICAL DECOMPRESSION/DISCECTOMY FUSION 2 LEVELS (N/A) - C4-5 C5-6 Anterior cervical decompression/diskectomy/fusion  Patient Location: PACU  Anesthesia Type:General  Level of Consciousness: awake, alert  and oriented  Airway & Oxygen Therapy: Patient Spontanous Breathing and Patient connected to nasal cannula oxygen  Post-op Assessment: Report given to PACU RN, Post -op Vital signs reviewed and stable and Patient moving all extremities X 4  Post vital signs: Reviewed and stable  Complications: No apparent anesthesia complications

## 2012-07-21 NOTE — Op Note (Signed)
Preoperative diagnosis: Cervical spondylosis with radiculopathy C4-5 and C5 Post operative diagnosis: Cervical spondylosis with radiculopathy C4-5 and C5-6 Procedure: Anterior cervical discectomy decompression of nerve roots and spinal canal C4-5 and C5-6arthrodesis with structural allograft, Alphatec plate fixation [id number] Surgeon: Barnett Abu M.D. Asst.: Randi Kritzer Indications: Patient is a 68 year old individual who has had significant neck shoulder and arm pain that has been unrelenting is mostly left but sided symptoms. He has advanced spondylosis at C4-5 and C5-6 and to a lesser extent C6-C7 however he has significant foraminal stenosis on the left at C4-5 and C5-6 is been advised regarding the need for surgical decompression.   Procedure: The patient was brought to the operating room placed on the table in supine position. After the smooth induction of general endotracheal anesthesia neck was placed in 5 pounds of halter traction and prepped with alcohol and DuraPrep. After sterile draping and appropriate timeout procedure a transverse incision was created in the left side of the neck and carried down to the platysma. The plane between the sternocleidomastoid and strap muscles dissected bluntly until the prevertebral space was reached. The first identifiable disc space was noted to be C4-5 on a localizing radiograph. The dissection was then undertaken in the longus coli muscle to allow placement of a self-retaining Caspar type retractor.  The anterior longitudinal ligament was opened at T4-5 and ventral osteophytes were removed with a Leksell rongeur and Kerrison punch. Interspace was cleared of significant quantity of the degenerated disc material ~region of the posterior longitudinal ligament was reached. Dissection was carried out using a high-speed drill and 3-0 Karlin curettes. Uncinate processes were drilled down and removed and osteophytes from the inferior margin of the body of C4 were  removed with a Kerrison 2 mm gold punch. After the central canal and lateral recesses were well decompressed the stasis was achieved with the bipolar cautery and some small pledgets of Gelfoam soaked in thrombin that were later irrigated away.  A 6 mm transgressed was then prepared by enlarging the central cavity and filling with demineralized bone matrix and placing into the interspace. Next the retractor was removed and a 32 mm trestle plate was placed over the vertebral bodies and secured with working millimeter variable angle screws. A final localizing radiograph identified the position of the surgical construct. The stasis was achieved in the soft tissues and then the platysma was closed with 3-0 Vicryl in an interrupted fashion and 3-0 Vicryl was used in the subcuticular tissue. Blood loss was estimated at 25 cc.

## 2012-07-21 NOTE — Anesthesia Postprocedure Evaluation (Signed)
  Anesthesia Post-op Note  Patient: Levi [name]  Procedure(s) Performed: Procedure(s) with comments: ANTERIOR CERVICAL DECOMPRESSION/DISCECTOMY FUSION 2 LEVELS (N/A) - C4-5 C5-6 Anterior cervical decompression/diskectomy/fusion  Patient Location: PACU  Anesthesia Type:General  Level of Consciousness: awake, oriented and patient cooperative  Airway and Oxygen Therapy: Patient Spontanous Breathing  Post-op Pain: mild  Post-op Assessment: Post-op Vital signs reviewed, Patient's Cardiovascular Status Stable, Respiratory Function Stable, Patent Airway, No signs of Nausea or vomiting and Pain level controlled  Post-op Vital Signs: stable  Complications: No apparent anesthesia complications
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
## 2012-07-21 NOTE — Preoperative (Signed)
Beta Blockers   Reason not to administer Beta Blockers:Not Applicable 

## 2012-07-21 NOTE — Anesthesia Procedure Notes (Signed)
Procedure Name: Intubation Date/Time: 07/21/2012 11:31 AM Performed by: Quentin Ore Pre-anesthesia Checklist: Patient identified, Emergency Drugs available, Suction available, Patient being monitored and Timeout performed Patient Re-evaluated:Patient Re-evaluated prior to inductionOxygen Delivery Method: Circle system utilized Preoxygenation: Pre-oxygenation with 100% oxygen Intubation Type: IV induction Ventilation: Mask ventilation without difficulty Laryngoscope Size: Mac and 3 Grade View: Grade I Tube type: Oral Tube size: 7.5 mm Number of attempts: 1 Airway Equipment and Method: LTA kit utilized and Stylet Placement Confirmation: ETT inserted through vocal cords under direct vision,  positive ETCO2 and breath sounds checked- equal and bilateral Secured at: 22 cm Tube secured with: Tape Dental Injury: Teeth and Oropharynx as per pre-operative assessment

## 2012-07-22 DIAGNOSIS — M47812 Spondylosis without myelopathy or radiculopathy, cervical region: Secondary | ICD-10-CM | POA: Diagnosis not present

## 2012-07-22 DIAGNOSIS — M5412 Radiculopathy, cervical region: Secondary | ICD-10-CM | POA: Diagnosis not present

## 2012-07-22 MED ORDER — DIAZEPAM 5 MG PO TABS: 5.0000 mg | ORAL_TABLET | Freq: Four times a day (QID) | ORAL | Status: DC | PRN

## 2012-07-22 MED ORDER — OXYCODONE-ACETAMINOPHEN 5-325 MG PO TABS: 1.0000 | ORAL_TABLET | ORAL | Status: DC | PRN

## 2012-07-22 NOTE — Discharge Summary (Signed)
Physician Discharge Summary  Patient ID: Levi [name] MRN: [id number] DOB/AGE: [birth year] 67 y.o.  Admit date: 07/21/2012 Discharge date: 07/22/2012  Admission Diagnoses: Cervical spondylosis with radiculopathy C4-5 C5-C6  Discharge Diagnoses: Cervical spondylosis with radiculopathy C4-5 C5-C6 Active Problems:   * No active hospital problems. *   Discharged Condition: good  Hospital Course: Patient was admitted to undergo anterior cervical decompression arthrodesis at C4-5 and C5-6. Tolerated the procedure well. He is discharged home without any complicating features.  Consults: None  Significant Diagnostic Studies: MRI cervical spine  Treatments: Anterior cervical decompression C4-5 C5-6 arthrodesis with structural allograft Alphatec plate fixation from C4-C6.  Discharge Exam: Blood pressure 132/68, pulse 56, temperature 97.6 F (36.4 C), temperature source Oral, resp. rate 18, height 6\' 2"  (1.88 m), weight 70.7 kg (155 lb 13.8 oz), SpO2 98.00%. Incision is clean and dry motor function in upper extremities is normal. Station and gait are normal.  Disposition: 01-Home or Self Care  Discharge Orders   Future Appointments Provider Department Dept Phone   07/27/2012 9:00 AM [facility] [phone]   07/31/2012 10:45 AM [name], MD [facility] [phone]   12/28/2012 9:15 AM Waymon Budge, MD [facility] [phone]   Future Orders Complete By Expires     Call MD for:  redness, tenderness, or signs of infection (pain, swelling, redness, odor or green/yellow discharge around incision site)  As directed     Call MD for:  severe uncontrolled pain  As directed     Call MD for:  temperature >100.4  As directed     Diet - low sodium heart healthy  As directed     Discharge instructions  As directed     Comments:      Okay to shower. Do not apply salves or appointments to incision. No heavy lifting with the upper extremities  greater than 15 pounds. May resume driving when not requiring pain medication and patient feels comfortable with doing so.    Increase activity slowly  As directed         Medication List    TAKE these medications       albuterol 108 (90 BASE) MCG/ACT inhaler  Commonly known as:  PROAIR HFA  Inhale 2 puffs into the lungs every 4 (four) hours as needed for wheezing.     allopurinol 100 MG tablet  Commonly known as:  ZYLOPRIM  Take 100 mg by mouth daily.     atorvastatin 10 MG tablet  Commonly known as:  LIPITOR  Take 10 mg by mouth daily.     beclomethasone 80 MCG/ACT inhaler  Commonly known as:  QVAR  Inhale 2 puffs into the lungs 2 (two) times daily.     candesartan 4 MG tablet  Commonly known as:  ATACAND  Take 2 mg by mouth daily.     diazepam 5 MG tablet  Commonly known as:  VALIUM  Take 1 tablet (5 mg total) by mouth every 6 (six) hours as needed for anxiety (Muscle spasm).     diltiazem 120 MG 24 hr capsule  Commonly known as:  CARDIZEM CD  Take 1 capsule (120 mg total) by mouth daily.     formoterol 12 MCG capsule for inhaler  Commonly known as:  FORADIL AEROLIZER  Place 1 capsule (12 mcg total) into inhaler and inhale 2 (two) times daily.     isosorbide mononitrate 30 MG 24 hr tablet  Commonly known as:  IMDUR  Take 30 mg by mouth daily.     oxyCODONE-acetaminophen 5-325 MG per tablet  Commonly known as:  PERCOCET/ROXICET  Take 1-2 tablets by mouth every 4 (four) hours as needed for pain.     pantoprazole 40 MG tablet  Commonly known as:  PROTONIX  Take 1 tablet (40 mg total) by mouth 2 (two) times daily.     predniSONE 10 MG tablet  Commonly known as:  DELTASONE  Take 1 tablet (10 mg total) by mouth daily.         SignedStefani Dama 07/22/2012, 8:43 AM

## 2012-07-22 NOTE — Care Management Note (Signed)
    Page 1 of 1   07/22/2012     10:30:07 AM   CARE MANAGEMENT NOTE 07/22/2012  Patient:  Levi [name], Levi [name]   Account Number:  [id number]  Date Initiated:  07/21/2012  Documentation initiated by:  [facility]  Subjective/Objective Assessment:   admitted postop ACDF C4-5, C5-6     Action/Plan:   return home with spouse   Anticipated DC Date:  07/22/2012   Anticipated DC Plan:  HOME/SELF CARE      DC Planning Services  CM consult      Choice offered to / List presented to:             Status of service:  Completed, signed off Medicare Important Message given?   (If response is "NO", the following Medicare IM given date fields will be blank) Date Medicare IM given:   Date Additional Medicare IM given:    Discharge Disposition:  HOME/SELF CARE  Per UR Regulation:  Reviewed for med. necessity/level of care/duration of stay  If discussed at Long Length of Stay Meetings, dates discussed:    Comments:

## 2012-07-22 NOTE — Progress Notes (Signed)
Patient ambulated around hall this AM 3 times with no difficulty. Mild neck pain only.

## 2012-07-22 NOTE — Progress Notes (Signed)
Pt d/c to home by car with family. Assessment stable prescriptions given. Pt able to verbalize understanding of d/c instructions.

## 2012-07-23 ENCOUNTER — Encounter (HOSPITAL_COMMUNITY): Payer: Self-pay | Admitting: Neurological Surgery

## 2012-07-27 ENCOUNTER — Ambulatory Visit: Payer: Medicare Other

## 2012-07-27 ENCOUNTER — Ambulatory Visit (INDEPENDENT_AMBULATORY_CARE_PROVIDER_SITE_OTHER): Payer: Medicare Other

## 2012-07-27 DIAGNOSIS — J309 Allergic rhinitis, unspecified: Secondary | ICD-10-CM

## 2012-07-31 ENCOUNTER — Ambulatory Visit: Payer: BC Managed Care – PPO | Admitting: Pulmonary Disease

## 2012-08-03 ENCOUNTER — Ambulatory Visit (INDEPENDENT_AMBULATORY_CARE_PROVIDER_SITE_OTHER): Payer: Medicare Other

## 2012-08-03 DIAGNOSIS — J309 Allergic rhinitis, unspecified: Secondary | ICD-10-CM

## 2012-08-05 DIAGNOSIS — M47812 Spondylosis without myelopathy or radiculopathy, cervical region: Secondary | ICD-10-CM | POA: Diagnosis not present

## 2012-08-10 ENCOUNTER — Ambulatory Visit (INDEPENDENT_AMBULATORY_CARE_PROVIDER_SITE_OTHER): Payer: Medicare Other

## 2012-08-10 DIAGNOSIS — J309 Allergic rhinitis, unspecified: Secondary | ICD-10-CM

## 2012-08-12 ENCOUNTER — Ambulatory Visit (INDEPENDENT_AMBULATORY_CARE_PROVIDER_SITE_OTHER): Payer: Medicare Other

## 2012-08-12 ENCOUNTER — Ambulatory Visit: Payer: BC Managed Care – PPO | Admitting: Pulmonary Disease

## 2012-08-12 DIAGNOSIS — J309 Allergic rhinitis, unspecified: Secondary | ICD-10-CM

## 2012-08-17 ENCOUNTER — Ambulatory Visit (INDEPENDENT_AMBULATORY_CARE_PROVIDER_SITE_OTHER): Payer: Medicare Other

## 2012-08-17 DIAGNOSIS — J309 Allergic rhinitis, unspecified: Secondary | ICD-10-CM | POA: Diagnosis not present

## 2012-08-21 DIAGNOSIS — M1710 Unilateral primary osteoarthritis, unspecified knee: Secondary | ICD-10-CM | POA: Diagnosis not present

## 2012-08-24 ENCOUNTER — Other Ambulatory Visit: Payer: Self-pay | Admitting: Pulmonary Disease

## 2012-08-24 ENCOUNTER — Ambulatory Visit (INDEPENDENT_AMBULATORY_CARE_PROVIDER_SITE_OTHER): Payer: Medicare Other

## 2012-08-24 DIAGNOSIS — J309 Allergic rhinitis, unspecified: Secondary | ICD-10-CM | POA: Diagnosis not present

## 2012-08-27 ENCOUNTER — Encounter: Payer: Self-pay | Admitting: Pulmonary Disease

## 2012-08-27 ENCOUNTER — Ambulatory Visit (INDEPENDENT_AMBULATORY_CARE_PROVIDER_SITE_OTHER): Payer: Medicare Other | Admitting: Pulmonary Disease

## 2012-08-27 VITALS — BP 110/76 | HR 85 | Temp 97.6°F | Ht 74.0 in | Wt 157.8 lb

## 2012-08-27 DIAGNOSIS — J45909 Unspecified asthma, uncomplicated: Secondary | ICD-10-CM | POA: Diagnosis not present

## 2012-08-27 NOTE — Patient Instructions (Addendum)
Stay on current asthma meds.  Will send in a refill for your qvar.  Can take zyrtec 10mg  one a day for your allergy symptoms if worsens. Use neil med sinus rinses am and in pm if helps during the peak of allergy season. followup with me in 4mos, but call if having issues.

## 2012-08-27 NOTE — Progress Notes (Signed)
  Subjective:    Patient ID: Levi [name], male    DOB: [birth year], 67 y.o.   MRN: [id number]  HPI Patient comes in today for followup of his known severe asthma.  He is doing very well on his current bronchodilator regimen, but also has required chronic prednisone at low dose.  He has had no acute exacerbation since his last visit, although he has had a little more issues with his breathing with the recent change in the weather.  He has not been over using his rescue inhaler.  He thinks a lot of this is because of the upcoming allergy season, and has had some nasal congestion.   Review of Systems  Constitutional: Negative for fever and unexpected weight change.  HENT: Positive for congestion. Negative for ear pain, nosebleeds, sore throat, rhinorrhea, sneezing, trouble swallowing, dental problem, postnasal drip and sinus pressure.   Eyes: Negative for redness and itching.  Respiratory: Negative for cough, chest tightness, shortness of breath and wheezing.   Cardiovascular: Negative for palpitations and leg swelling.  Gastrointestinal: Negative for nausea and vomiting.  Genitourinary: Negative for dysuria.  Musculoskeletal: Negative for joint swelling.  Skin: Negative for rash.  Neurological: Negative for headaches.  Hematological: Does not bruise/bleed easily.  Psychiatric/Behavioral: Negative for dysphoric mood. The patient is not nervous/anxious.        Objective:   Physical Exam Thin male in no acute distress Nose without purulence or discharge noted Neck without lymphadenopathy or thyromegaly Chest with moderately decreased breath sounds, no wheezing noted Cardiac exam is regular rate and rhythm Lower extremities without edema, cyanosis Alert and oriented, moves all 4 extremities.       Assessment & Plan:

## 2012-08-27 NOTE — Assessment & Plan Note (Signed)
The patient overall is doing fairly well from an asthma standpoint.  He is staying compliant on his medication, and has not had acute exacerbation.  I am concerned about the upcoming allergy season causing a flare of his disease, and asked him to try doing sinus rinses every day to try and minimize the effect of pollen.  Also asked him to take an over-the-counter antihistamine as needed.  He is to call as if he has any worsening of his asthma.

## 2012-08-31 ENCOUNTER — Ambulatory Visit (INDEPENDENT_AMBULATORY_CARE_PROVIDER_SITE_OTHER): Payer: Medicare Other

## 2012-08-31 DIAGNOSIS — J309 Allergic rhinitis, unspecified: Secondary | ICD-10-CM

## 2012-09-07 ENCOUNTER — Ambulatory Visit (INDEPENDENT_AMBULATORY_CARE_PROVIDER_SITE_OTHER): Payer: Medicare Other

## 2012-09-07 DIAGNOSIS — J309 Allergic rhinitis, unspecified: Secondary | ICD-10-CM | POA: Diagnosis not present

## 2012-09-14 ENCOUNTER — Ambulatory Visit (INDEPENDENT_AMBULATORY_CARE_PROVIDER_SITE_OTHER): Payer: Medicare Other

## 2012-09-14 DIAGNOSIS — J309 Allergic rhinitis, unspecified: Secondary | ICD-10-CM | POA: Diagnosis not present

## 2012-09-21 ENCOUNTER — Ambulatory Visit (INDEPENDENT_AMBULATORY_CARE_PROVIDER_SITE_OTHER): Payer: Medicare Other

## 2012-09-21 DIAGNOSIS — J309 Allergic rhinitis, unspecified: Secondary | ICD-10-CM | POA: Diagnosis not present

## 2012-09-28 ENCOUNTER — Ambulatory Visit (INDEPENDENT_AMBULATORY_CARE_PROVIDER_SITE_OTHER): Payer: Medicare Other

## 2012-09-28 DIAGNOSIS — J309 Allergic rhinitis, unspecified: Secondary | ICD-10-CM

## 2012-10-01 DIAGNOSIS — E7800 Pure hypercholesterolemia, unspecified: Secondary | ICD-10-CM | POA: Diagnosis not present

## 2012-10-01 DIAGNOSIS — J449 Chronic obstructive pulmonary disease, unspecified: Secondary | ICD-10-CM | POA: Diagnosis not present

## 2012-10-01 DIAGNOSIS — I4891 Unspecified atrial fibrillation: Secondary | ICD-10-CM | POA: Diagnosis not present

## 2012-10-01 DIAGNOSIS — I2510 Atherosclerotic heart disease of native coronary artery without angina pectoris: Secondary | ICD-10-CM | POA: Diagnosis not present

## 2012-10-02 DIAGNOSIS — M19019 Primary osteoarthritis, unspecified shoulder: Secondary | ICD-10-CM | POA: Diagnosis not present

## 2012-10-02 DIAGNOSIS — M7660 Achilles tendinitis, unspecified leg: Secondary | ICD-10-CM | POA: Diagnosis not present

## 2012-10-04 ENCOUNTER — Other Ambulatory Visit: Payer: Self-pay | Admitting: Pulmonary Disease

## 2012-10-05 ENCOUNTER — Ambulatory Visit (INDEPENDENT_AMBULATORY_CARE_PROVIDER_SITE_OTHER): Payer: Medicare Other

## 2012-10-05 DIAGNOSIS — J309 Allergic rhinitis, unspecified: Secondary | ICD-10-CM

## 2012-10-06 DIAGNOSIS — S92309A Fracture of unspecified metatarsal bone(s), unspecified foot, initial encounter for closed fracture: Secondary | ICD-10-CM | POA: Diagnosis not present

## 2012-10-09 MED ORDER — PREDNISONE 10 MG PO TABS: 10.0000 mg | ORAL_TABLET | Freq: Every day | ORAL | Status: DC

## 2012-10-09 NOTE — Addendum Note (Signed)
Addended by: [name] on: 10/09/2012 02:57 PM   Modules accepted: Orders, Medications

## 2012-10-12 ENCOUNTER — Ambulatory Visit (INDEPENDENT_AMBULATORY_CARE_PROVIDER_SITE_OTHER): Payer: Medicare Other

## 2012-10-12 ENCOUNTER — Encounter: Payer: Self-pay | Admitting: Internal Medicine

## 2012-10-12 DIAGNOSIS — J309 Allergic rhinitis, unspecified: Secondary | ICD-10-CM | POA: Diagnosis not present

## 2012-10-16 DIAGNOSIS — Z79899 Other long term (current) drug therapy: Secondary | ICD-10-CM | POA: Diagnosis not present

## 2012-10-16 DIAGNOSIS — E7800 Pure hypercholesterolemia, unspecified: Secondary | ICD-10-CM | POA: Diagnosis not present

## 2012-10-20 ENCOUNTER — Ambulatory Visit (INDEPENDENT_AMBULATORY_CARE_PROVIDER_SITE_OTHER): Payer: Medicare Other

## 2012-10-20 DIAGNOSIS — J309 Allergic rhinitis, unspecified: Secondary | ICD-10-CM

## 2012-10-20 DIAGNOSIS — S92309A Fracture of unspecified metatarsal bone(s), unspecified foot, initial encounter for closed fracture: Secondary | ICD-10-CM | POA: Diagnosis not present

## 2012-10-26 ENCOUNTER — Ambulatory Visit (INDEPENDENT_AMBULATORY_CARE_PROVIDER_SITE_OTHER): Payer: Medicare Other

## 2012-10-26 DIAGNOSIS — J309 Allergic rhinitis, unspecified: Secondary | ICD-10-CM

## 2012-11-02 ENCOUNTER — Ambulatory Visit (INDEPENDENT_AMBULATORY_CARE_PROVIDER_SITE_OTHER): Payer: Medicare Other

## 2012-11-02 DIAGNOSIS — J309 Allergic rhinitis, unspecified: Secondary | ICD-10-CM | POA: Diagnosis not present

## 2012-11-03 ENCOUNTER — Other Ambulatory Visit: Payer: Self-pay | Admitting: Pulmonary Disease

## 2012-11-04 NOTE — Telephone Encounter (Signed)
Pred 10mg  has already been called into patient pharm 10/09/12. Pred 5mg  was the wrong dose. Erroneous entry.

## 2012-11-07 ENCOUNTER — Other Ambulatory Visit: Payer: Self-pay | Admitting: Pulmonary Disease

## 2012-11-09 ENCOUNTER — Ambulatory Visit (INDEPENDENT_AMBULATORY_CARE_PROVIDER_SITE_OTHER): Payer: Medicare Other

## 2012-11-09 DIAGNOSIS — J309 Allergic rhinitis, unspecified: Secondary | ICD-10-CM

## 2012-11-16 ENCOUNTER — Ambulatory Visit (INDEPENDENT_AMBULATORY_CARE_PROVIDER_SITE_OTHER): Payer: Medicare Other

## 2012-11-16 DIAGNOSIS — J309 Allergic rhinitis, unspecified: Secondary | ICD-10-CM | POA: Diagnosis not present

## 2012-11-23 ENCOUNTER — Ambulatory Visit (INDEPENDENT_AMBULATORY_CARE_PROVIDER_SITE_OTHER): Payer: Medicare Other

## 2012-11-23 DIAGNOSIS — J309 Allergic rhinitis, unspecified: Secondary | ICD-10-CM

## 2012-11-25 ENCOUNTER — Other Ambulatory Visit: Payer: Self-pay | Admitting: Pulmonary Disease

## 2012-12-02 ENCOUNTER — Ambulatory Visit (INDEPENDENT_AMBULATORY_CARE_PROVIDER_SITE_OTHER): Payer: PRIVATE HEALTH INSURANCE

## 2012-12-02 DIAGNOSIS — J309 Allergic rhinitis, unspecified: Secondary | ICD-10-CM

## 2012-12-11 ENCOUNTER — Ambulatory Visit (INDEPENDENT_AMBULATORY_CARE_PROVIDER_SITE_OTHER): Payer: Medicare Other

## 2012-12-11 DIAGNOSIS — J309 Allergic rhinitis, unspecified: Secondary | ICD-10-CM

## 2012-12-14 ENCOUNTER — Ambulatory Visit: Payer: Medicare Other

## 2012-12-18 ENCOUNTER — Ambulatory Visit (INDEPENDENT_AMBULATORY_CARE_PROVIDER_SITE_OTHER): Payer: Medicare Other

## 2012-12-18 DIAGNOSIS — J309 Allergic rhinitis, unspecified: Secondary | ICD-10-CM

## 2012-12-21 ENCOUNTER — Ambulatory Visit (INDEPENDENT_AMBULATORY_CARE_PROVIDER_SITE_OTHER): Payer: Medicare Other

## 2012-12-21 DIAGNOSIS — J309 Allergic rhinitis, unspecified: Secondary | ICD-10-CM | POA: Diagnosis not present

## 2012-12-23 ENCOUNTER — Ambulatory Visit (INDEPENDENT_AMBULATORY_CARE_PROVIDER_SITE_OTHER): Payer: Medicare Other

## 2012-12-23 DIAGNOSIS — J309 Allergic rhinitis, unspecified: Secondary | ICD-10-CM

## 2012-12-24 ENCOUNTER — Encounter: Payer: Self-pay | Admitting: Internal Medicine

## 2012-12-25 ENCOUNTER — Ambulatory Visit: Payer: Medicare Other

## 2012-12-28 ENCOUNTER — Encounter: Payer: Self-pay | Admitting: Internal Medicine

## 2012-12-28 ENCOUNTER — Ambulatory Visit (INDEPENDENT_AMBULATORY_CARE_PROVIDER_SITE_OTHER): Payer: Medicare Other | Admitting: Internal Medicine

## 2012-12-28 ENCOUNTER — Ambulatory Visit (INDEPENDENT_AMBULATORY_CARE_PROVIDER_SITE_OTHER): Payer: Medicare Other

## 2012-12-28 VITALS — BP 110/62 | HR 70 | Ht 74.0 in | Wt 161.6 lb

## 2012-12-28 DIAGNOSIS — J301 Allergic rhinitis due to pollen: Secondary | ICD-10-CM | POA: Diagnosis not present

## 2012-12-28 DIAGNOSIS — J309 Allergic rhinitis, unspecified: Secondary | ICD-10-CM | POA: Diagnosis not present

## 2012-12-28 NOTE — Progress Notes (Signed)
Patient ID: Levi [name], male    DOB: Nov 07, 1945, 67 y.o.   MRN: [id number]  HPI 12/24/10- 89 yoM never smoker followed for allergy/ asthma, rhinitis by Dr Maple Hudson, for asthma/ hx ABPA by Dr Shelle Iron Last here June 25, 2010 Last visit he had a rash on his hand. That resolved with topical steroid, but has recently recurred in the same area on dorsum right hand. Allergy vaccine- 1:10 GH Had more asthma at end of April- saw Dr Shelle Iron. Maintenance prednisone was briefly increased, now back to 5 mg daily. Has needed rescue inhaler only 2-3x since then.  Occasional stuffy nose- uses saline nasal rinse.  CXR Feb, 2012- clear, NAD.   06/26/11- 64 yoM never smoker followed for allergy/ asthma, rhinitis by Dr Maple Hudson, for asthma/ hx ABPA by Dr Shelle Iron He was itching and sneezing a few days ago but that resolved. He had a temperature up to 100.3 with sweating at night, light green sputum, raspy crackle in his chest. He felt that he had a cold starting to get better. Denies sore throat sinus pain or chest pain. Continues allergy vaccine without problems and is satisfied that it is helpful. Continues prednisone maintenance 5 mg daily. Has not had significant exacerbation of wheezing with his recent acute illness.  12/27/11-  64 yoM never smoker followed for allergy/ asthma, rhinitis by Dr Maple Hudson, for asthma/ hx ABPA by Dr Shelle Iron Doing well on vaccine and denies any trouble/flare ups at this time Continue vaccine 1:10 GH. Spring was difficult season, with some tightness, but doing well now.  Always some postnasal drip and nose "floods" at times. Rare headache.  12/28/12-  64 yoM never smoker followed for allergy/ asthma, rhinitis by Dr Maple Hudson, for asthma/ hx ABPA by Dr Shelle Iron FOLLOWS FOR: still on vaccine 1:10 GH and doing well. Says it helps. Maintenance prednisone 10 mg daily. In early summer needed more rescue inhaler blamed on air quality. Doing particularly well now. Doesn't remember experience with Dymista or  Flonase. Uses occasional Zyrtec. Has a chronic septal perforation so we agreed not to use any nasal spray now.   ROS-see HPI Constitutional:   No-   weight loss, night sweats, fevers, chills, fatigue, lassitude. HEENT:   +rare headaches, difficulty swallowing, tooth/dental problems, sore throat,      No- sneezing, itching, ear ache, nasal congestion, post nasal drip,  CV:  No-   chest pain, orthopnea, PND, swelling in lower extremities, anasarca, dizziness, palpitations Resp: No-   shortness of breath with exertion or at rest.             No-  productive cough,  No non-productive cough,  No- coughing up of blood.              No- change in color of mucus.  No- wheezing.   Skin: No-   rash or lesions. GI:  No-   heartburn, indigestion, abdominal pain, nausea, vomiting, GU:  MS:  No-   joint pain or swelling.   Neuro-     nothing unusual Psych:  No- change in mood or affect. No depression or anxiety.  No memory loss.  Objective:   Physical Exam General- Alert, Oriented, Affect-appropriate, Distress- none acute   Tall, slender, relaxed Skin- , excoriation- none  Lymphadenopathy- none Head- atraumatic            Eyes- Gross vision intact, PERRLA, conjunctivae clear secretions            Ears- Hearing, canals normal  Nose- clear , Septal dev,  polyps, erosion,   + perforation             Throat- Mallampati II , mucosa clear , drainage- none, tonsils- atrophic. Teeth crowned.  Neck- flexible , trachea midline, no stridor , thyroid nl, carotid no bruit Chest - symmetrical excursion , unlabored           Heart/CV- RRR , no murmur , no gallop  , no rub, nl s1 s2                           - JVD- none , edema- none, stasis changes- none, varices- none           Lung- quiet chest, wheeze- none, cough- none , dullness-none, rub- none           Chest wall-  Abd- Br/ Gen/ Rectal- Not done, not indicated Extrem- cyanosis- none, clubbing, none, atrophy- none, strength- nl Neuro-  grossly intact to observation

## 2012-12-28 NOTE — Patient Instructions (Addendum)
We can continue allergy vaccine 1:10 GH  Follow up with Dr Shelle Iron as planned  Please call as needed

## 2012-12-31 ENCOUNTER — Ambulatory Visit: Payer: Medicare Other | Admitting: Pulmonary Disease

## 2012-12-31 DIAGNOSIS — M169 Osteoarthritis of hip, unspecified: Secondary | ICD-10-CM | POA: Diagnosis not present

## 2012-12-31 DIAGNOSIS — M545 Low back pain, unspecified: Secondary | ICD-10-CM | POA: Diagnosis not present

## 2012-12-31 DIAGNOSIS — M1610 Unilateral primary osteoarthritis, unspecified hip: Secondary | ICD-10-CM | POA: Diagnosis not present

## 2012-12-31 DIAGNOSIS — M1710 Unilateral primary osteoarthritis, unspecified knee: Secondary | ICD-10-CM | POA: Diagnosis not present

## 2013-01-04 ENCOUNTER — Ambulatory Visit: Payer: Medicare Other

## 2013-01-06 ENCOUNTER — Ambulatory Visit (INDEPENDENT_AMBULATORY_CARE_PROVIDER_SITE_OTHER): Payer: Medicare Other

## 2013-01-06 ENCOUNTER — Ambulatory Visit (INDEPENDENT_AMBULATORY_CARE_PROVIDER_SITE_OTHER): Payer: Medicare Other | Admitting: Pulmonary Disease

## 2013-01-06 ENCOUNTER — Encounter: Payer: Self-pay | Admitting: Pulmonary Disease

## 2013-01-06 VITALS — BP 110/68 | HR 62 | Temp 96.9°F | Ht 74.0 in | Wt 162.4 lb

## 2013-01-06 DIAGNOSIS — J45909 Unspecified asthma, uncomplicated: Secondary | ICD-10-CM

## 2013-01-06 DIAGNOSIS — Z23 Encounter for immunization: Secondary | ICD-10-CM

## 2013-01-06 DIAGNOSIS — J309 Allergic rhinitis, unspecified: Secondary | ICD-10-CM

## 2013-01-06 NOTE — Patient Instructions (Addendum)
Will give you the pneumovax today. No change in medications. followup with me in 6mos if doing well.

## 2013-01-06 NOTE — Assessment & Plan Note (Signed)
The patient is doing extremely well from an asthma standpoint, and I have asked him to continue with his current medications.  He has not had a Pneumovax since turning 66, and we'll therefore go ahead and give him this today.  He will followup with me in 6 months if doing well.  I have reminded him to get his flu shot in the fall.

## 2013-01-06 NOTE — Addendum Note (Signed)
Addended by: Darrell Jewel on: 01/06/2013 12:37 PM   Modules accepted: Orders

## 2013-01-06 NOTE — Assessment & Plan Note (Signed)
He feels allergy vaccine has definitely helped him. No problems. Because of his septal perforation which don't want to put anything extra in his nose that might cause irritation and start bleeding. This can be reassessed later.

## 2013-01-06 NOTE — Progress Notes (Signed)
  Subjective:    Patient ID: Levi [name], male    DOB: 07/09/1945, 67 y.o.   MRN: [id number]  HPI The patient comes in for followup of his known asthma with significant allergic component.  He has done extremely well since the last visit, and is staying active.  He plans to run in an upcoming race here in [place].  He denies any significant chest congestion, cough, and rarely uses his rescue inhaler.  He continues with his allergy injections.   Review of Systems  Constitutional: Negative for fever and unexpected weight change.  HENT: Negative for ear pain, nosebleeds, congestion, sore throat, rhinorrhea, sneezing, trouble swallowing, dental problem, postnasal drip and sinus pressure.   Eyes: Negative for redness and itching.  Respiratory: Negative for cough, chest tightness, shortness of breath and wheezing.   Cardiovascular: Negative for palpitations and leg swelling.  Gastrointestinal: Negative for nausea and vomiting.  Genitourinary: Negative for dysuria.  Musculoskeletal: Negative for joint swelling.  Skin: Negative for rash.  Neurological: Negative for headaches.  Hematological: Does not bruise/bleed easily.  Psychiatric/Behavioral: Negative for dysphoric mood. The patient is not nervous/anxious.        Objective:   Physical Exam Thin male in no acute Nose without purulence or discharge noted Neck without lymphadenopathy or thyromegaly Chest totally clear to auscultation, no wheezing Heart exam with regular rate and rhythm Lower extremities without edema, no cyanosis Alert and oriented, moves all 4 extremities.       Assessment & Plan:

## 2013-01-11 ENCOUNTER — Telehealth: Payer: Self-pay

## 2013-01-11 NOTE — Telephone Encounter (Signed)
Message copied by [place], Teauna Dubach E on Mon Jan 11, 2013 12:36 PM ------      Message from: Mckinley Jewel, AMY L      Created: Mon Jan 11, 2013  9:58 AM       Pt returning your call from Friday.      He can be reached at [phone]            Thanks!      Amy ------

## 2013-01-11 NOTE — Telephone Encounter (Signed)
Spoke to wife Revonda Standard regarding records from [place].  She will have her husband call them and request they send them here before 01/22/13 appointment with Dr. Stan Head.

## 2013-01-13 ENCOUNTER — Ambulatory Visit (INDEPENDENT_AMBULATORY_CARE_PROVIDER_SITE_OTHER): Payer: Medicare Other

## 2013-01-13 DIAGNOSIS — J309 Allergic rhinitis, unspecified: Secondary | ICD-10-CM

## 2013-01-18 ENCOUNTER — Ambulatory Visit (INDEPENDENT_AMBULATORY_CARE_PROVIDER_SITE_OTHER): Payer: Medicare Other

## 2013-01-18 DIAGNOSIS — J309 Allergic rhinitis, unspecified: Secondary | ICD-10-CM

## 2013-01-20 ENCOUNTER — Ambulatory Visit: Payer: Medicare Other

## 2013-01-21 DIAGNOSIS — J449 Chronic obstructive pulmonary disease, unspecified: Secondary | ICD-10-CM | POA: Diagnosis not present

## 2013-01-21 DIAGNOSIS — R002 Palpitations: Secondary | ICD-10-CM | POA: Diagnosis not present

## 2013-01-21 DIAGNOSIS — IMO0002 Reserved for concepts with insufficient information to code with codable children: Secondary | ICD-10-CM | POA: Diagnosis not present

## 2013-01-21 DIAGNOSIS — E785 Hyperlipidemia, unspecified: Secondary | ICD-10-CM | POA: Diagnosis not present

## 2013-01-21 DIAGNOSIS — I4891 Unspecified atrial fibrillation: Secondary | ICD-10-CM | POA: Diagnosis not present

## 2013-01-21 DIAGNOSIS — I519 Heart disease, unspecified: Secondary | ICD-10-CM | POA: Diagnosis not present

## 2013-01-22 ENCOUNTER — Encounter: Payer: Self-pay | Admitting: Internal Medicine

## 2013-01-22 ENCOUNTER — Ambulatory Visit (INDEPENDENT_AMBULATORY_CARE_PROVIDER_SITE_OTHER): Payer: Medicare Other | Admitting: Internal Medicine

## 2013-01-22 VITALS — BP 98/70 | HR 72 | Ht 74.0 in | Wt 159.0 lb

## 2013-01-22 DIAGNOSIS — R198 Other specified symptoms and signs involving the digestive system and abdomen: Secondary | ICD-10-CM

## 2013-01-22 DIAGNOSIS — R151 Fecal smearing: Secondary | ICD-10-CM

## 2013-01-22 DIAGNOSIS — R109 Unspecified abdominal pain: Secondary | ICD-10-CM

## 2013-01-22 MED ORDER — NA SULFATE-K SULFATE-MG SULF 17.5-3.13-1.6 GM/177ML PO SOLN: ORAL | Status: DC

## 2013-01-22 NOTE — Patient Instructions (Signed)
You have been scheduled for a colonoscopy with propofol. Please follow written instructions given to you at your visit today.  Please pick up your prep kit at the pharmacy within the next 1-3 days. If you use inhalers (even only as needed), please bring them with you on the day of your procedure. Your physician has requested that you go to www.startemmi.com and enter the access code given to you at your visit today. This web site gives a general overview about your procedure. However, you should still follow specific instructions given to you by our office regarding your preparation for the procedure.  I appreciate the opportunity to care for you.   

## 2013-01-22 NOTE — Progress Notes (Signed)
Subjective:    Patient ID: Levi [name], male    DOB: 04/05/1946, 67 y.o.   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 He reports a several month - 1 year history of more irregular bowel habits, intermittent narrow stools and some seepage of stool in underwear and buttock area about an hour after defecation. Some lower abdominal cramps that radiate into epigastriumalso. 3 prior colonoscopies in [place], the last almost 10 years ago. Records have been requested. He denies bleeding. There is some anal discomfort at times. GI ROS o/w negative.  Allergies  Allergen Reactions  . Morphine And Related Other (See Comments)    Light headed and sweaty; "my heart races" (04/28/2012)  . Levofloxacin Itching   Outpatient Prescriptions Prior to Visit  Medication Sig Dispense Refill  . albuterol (PROAIR HFA) 108 (90 BASE) MCG/ACT inhaler Inhale 2 puffs into the lungs every 4 (four) hours as needed for wheezing.  3 Inhaler  3  . allopurinol (ZYLOPRIM) 100 MG tablet Take 100 mg by mouth daily.        [name] atorvastatin (LIPITOR) 10 MG tablet Take 10 mg by mouth daily.       . candesartan (ATACAND) 4 MG tablet Take 2 mg by mouth daily.       [name] FORADIL AEROLIZER 12 MCG capsule for inhaler PLACE 1 CAPSULE (12 MCG TOTAL) INTO INHALER AND INHALE 2 (TWO) TIMES DAILY.  60 capsule  11  . isosorbide mononitrate (IMDUR) 30 MG 24 hr tablet Take 30 mg by mouth daily.       . pantoprazole (PROTONIX) 40 MG tablet TAKE 1 TABLET BY MOUTH TWICE A DAY  60 tablet  11  . predniSONE (DELTASONE) 10 MG tablet Take 1 tablet (10 mg total) by mouth daily.  90 tablet  3  . QVAR 80 MCG/ACT inhaler INHALE 2 PUFFS INTO THE LUNGS 2 (TWO) TIMES DAILY.  8.7 g  11  . diltiazem (CARDIZEM CD) 120 MG 24 hr capsule Take 1 capsule (120 mg total) by mouth daily.  30 capsule  12   No facility-administered medications prior to visit.   Past Medical History  Diagnosis Date  . Asthma   .  ALLERGIC RHINITIS   . COPD (chronic obstructive pulmonary disease)   . Complication of anesthesia     slow to wake up  . Hyperlipidemia     takes Lipitor daily  . Agent orange exposure 1970's    "shot my lungs" (04/28/2012)  . Shortness of breath     lying/sitting/standing  . Joint pain   . Joint swelling   . Elevated uric acid in blood     takes Allopurinol daily  . Bruises easily   . GERD (gastroesophageal reflux disease)     takes Protonix daily  . Diverticulitis   . Enlarged prostate   . Pneumonia ~ 2010    "once" (04/28/2012)  . Chronic bronchitis     "used to get it q yr; last time ?2008" (04/28/2012)  . H/O hiatal hernia   . Degenerative disk disease     "qwhere" (04/28/2012)  . Atrial fibrillation     atrial fib   Past Surgical History  Procedure Laterality Date  . Knee surgery    . Gallbladder surgery    . Total hip arthroplasty  2011    "left" (04/28/2012)  . Lateral / posterior combined fusion lumbar spine  2012  . Joint replacement    .  Total knee arthroplasty  01/10/2012    Procedure: TOTAL KNEE ARTHROPLASTY;  Surgeon: Harvie Junior, MD;  Location: [facility] OR;  Service: Orthopedics;;  left total knee arthroplasty  . Cardiac catheterization  11/2008    Dr. Anne Fu - 20% calcified non flow limiting left main, 50% EF apical hypokinesis  . Knee arthroscopy      "had them both scoped a bunch of times" (12/07/27/2011)  . Colonoscopy    . Esophagogastroduodenoscopy    . Reverse shoulder arthroplasty  04/28/2012    left  . Tonsillectomy and adenoidectomy  1954  . Cholecystectomy  1990's  . Eye surgery  2011    "bilaterally; steroidal encapsulation" (04/28/2012)  . Reverse shoulder arthroplasty  04/28/2012    Procedure: REVERSE SHOULDER ARTHROPLASTY;  Surgeon: Mable Paris, MD;  [facility];  Service: Orthopedics;  Laterality: Left;  Left shouder reverse total shoulder arthroplasty  . Shoulder surgery    . Anterior cervical decomp/discectomy fusion N/A 07/21/2012     Procedure: ANTERIOR CERVICAL DECOMPRESSION/DISCECTOMY FUSION 2 LEVELS;  Surgeon: Barnett Abu, MD;  Location: [facility] NEURO ORS;  Service: Neurosurgery;  Laterality: N/A;  C4-5 C5-6 Anterior cervical decompression/diskectomy/fusion   History   Social History  . Marital Status: Married    Spouse Name: N/A    Number of Children: N/A  . Years of Education: N/A   Occupational History  . 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      [place] vet   Social History Main Topics  . Smoking status: Former Smoker    Quit date: 05/27/1980  . Smokeless tobacco: Never Used  . Alcohol Use: No  . Drug Use: No  . Sexual Activity: Yes   Other Topics Concern  . 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 Heart disease Father     Died 13, MI  . Prostate cancer Brother    Review of Systems Allergies, osteoarthritis, back pain, dsypnea, sore throat. All other ROS negative    Objective:   Physical Exam General:  Well-developed, well-nourished and in no acute distress Eyes:  anicteric. ENT:   Mouth and posterior pharynx free of lesions.  Neck:   supple w/o thyromegaly or mass.  Lungs: Clear to auscultation bilaterally. Heart:  S1S2, no rubs, murmurs, gallops. Abdomen:  soft, non-tender, no hepatosplenomegaly, hernia, or mass and BS+.  Rectal: Deferred until colonoscopy Lymph:  no cervical or supraclavicular adenopathy. Extremities:   no edema Skin   no rash. Neuro:  A&O x 3.  Psych:  appropriate mood and  Affect.   Data Reviewed: Lab Results  Component Value Date   WBC 8.0 07/16/2012   HGB 12.4* 07/16/2012   HCT 37.3* 07/16/2012   MCV 86.1 07/16/2012   PLT 225 07/16/2012   Lab Results  Component Value Date   TSH 1.979 05/10/2012       Assessment & Plan:  Change in  bowel habits Lower abdominal pain Fecal soiling  1. Colonoscopy to evaluate the above. Possible causes range from hemorrhoids, IBS, diverticular dz and colon cancer. The risks and benefits as well as alternatives of endoscopic procedure(s) have been discussed and reviewed. All questions answered. The patient agrees to proceed. Plan for anoscopy at time of colonoscopy if needed.  I appreciate  the opportunity to care for this patient.  YN:WGNFAO, Jeannett Senior, MD

## 2013-01-26 ENCOUNTER — Ambulatory Visit (INDEPENDENT_AMBULATORY_CARE_PROVIDER_SITE_OTHER): Payer: Medicare Other

## 2013-01-26 DIAGNOSIS — J309 Allergic rhinitis, unspecified: Secondary | ICD-10-CM | POA: Diagnosis not present

## 2013-01-27 ENCOUNTER — Ambulatory Visit: Payer: Medicare Other

## 2013-02-03 ENCOUNTER — Encounter: Payer: Self-pay | Admitting: Internal Medicine

## 2013-02-03 ENCOUNTER — Ambulatory Visit (INDEPENDENT_AMBULATORY_CARE_PROVIDER_SITE_OTHER): Payer: Medicare Other

## 2013-02-03 ENCOUNTER — Ambulatory Visit (AMBULATORY_SURGERY_CENTER): Payer: Medicare Other | Admitting: Internal Medicine

## 2013-02-03 VITALS — BP 132/52 | HR 49 | Temp 97.6°F | Resp 48 | Ht 74.0 in | Wt 159.0 lb

## 2013-02-03 DIAGNOSIS — K5730 Diverticulosis of large intestine without perforation or abscess without bleeding: Secondary | ICD-10-CM | POA: Diagnosis not present

## 2013-02-03 DIAGNOSIS — D126 Benign neoplasm of colon, unspecified: Secondary | ICD-10-CM

## 2013-02-03 DIAGNOSIS — J309 Allergic rhinitis, unspecified: Secondary | ICD-10-CM | POA: Diagnosis not present

## 2013-02-03 DIAGNOSIS — I4891 Unspecified atrial fibrillation: Secondary | ICD-10-CM | POA: Diagnosis not present

## 2013-02-03 DIAGNOSIS — Z1211 Encounter for screening for malignant neoplasm of colon: Secondary | ICD-10-CM | POA: Diagnosis not present

## 2013-02-03 DIAGNOSIS — J449 Chronic obstructive pulmonary disease, unspecified: Secondary | ICD-10-CM | POA: Diagnosis not present

## 2013-02-03 DIAGNOSIS — R198 Other specified symptoms and signs involving the digestive system and abdomen: Secondary | ICD-10-CM | POA: Diagnosis not present

## 2013-02-03 MED ORDER — BENEFIBER PO POWD: ORAL | Status: DC

## 2013-02-03 MED ORDER — SODIUM CHLORIDE 0.9 % IV SOLN: 500.0000 mL | INTRAVENOUS | Status: DC

## 2013-02-03 NOTE — Progress Notes (Signed)
Called to room to assist during endoscopic procedure.  Patient ID and intended procedure confirmed with present staff. Received instructions for my participation in the procedure from the performing physician.  

## 2013-02-03 NOTE — Progress Notes (Signed)
Patient did not experience any of the following events: a burn prior to discharge; a fall within the facility; wrong site/side/patient/procedure/implant event; or a hospital transfer or hospital admission upon discharge from the facility. (G8907) Patient did not have preoperative order for IV antibiotic SSI prophylaxis. (G8918)  

## 2013-02-03 NOTE — Patient Instructions (Addendum)
I found and removed 3 polyps that look benign. You also have diverticulosis. No other abnormalities. Cause of your symptoms not entirely clear but might do better with fiber supplements.  Please start taking Benefiber (generic ok and cheaper) 1 tablespoon a day and increase to 2-3 tablespoons a day.  Call soon and make an appointment to see me in October (about 6 weeks).  I will let you know pathology results and when to have another routine colonoscopy by mail.  I appreciate the opportunity to care for you. Iva Boop, MD, FACG  YOU HAD AN ENDOSCOPIC PROCEDURE TODAY AT THE [facility]: Refer to the procedure report that was given to you for any specific questions about what was found during the examination.  If the procedure report does not answer your questions, please call your gastroenterologist to clarify.  If you requested that your care partner not be given the details of your procedure findings, then the procedure report has been included in a sealed envelope for you to review at your convenience later.  YOU SHOULD EXPECT: Some feelings of bloating in the abdomen. Passage of more gas than usual.  Walking can help get rid of the air that was put into your GI tract during the procedure and reduce the bloating. If you had a lower endoscopy (such as a colonoscopy or flexible sigmoidoscopy) you may notice spotting of blood in your stool or on the toilet paper. If you underwent a bowel prep for your procedure, then you may not have a normal bowel movement for a few days.  DIET: Your first meal following the procedure should be a light meal and then it is ok to progress to your normal diet.  A half-sandwich or bowl of soup is an example of a good first meal.  Heavy or fried foods are harder to digest and may make you feel nauseous or bloated.  Likewise meals heavy in dairy and vegetables can cause extra gas to form and this can also increase the bloating.  Drink plenty of fluids but  you should avoid alcoholic beverages for 24 hours.  ACTIVITY: Your care partner should take you home directly after the procedure.  You should plan to take it easy, moving slowly for the rest of the day.  You can resume normal activity the day after the procedure however you should NOT DRIVE or use heavy machinery for 24 hours (because of the sedation medicines used during the test).    SYMPTOMS TO REPORT IMMEDIATELY: A gastroenterologist can be reached at any hour.  During normal business hours, 8:30 AM to 5:00 PM Monday through Friday, call [phone].  After hours and on weekends, please call the GI answering service at [phone] who will take a message and have the physician on call contact you.   Following lower endoscopy (colonoscopy or flexible sigmoidoscopy):  Excessive amounts of blood in the stool  Significant tenderness or worsening of abdominal pains  Swelling of the abdomen that is new, acute  Fever of 100F or higher  FOLLOW UP: If any biopsies were taken you will be contacted by phone or by letter within the next 1-3 weeks.  Call your gastroenterologist if you have not heard about the biopsies in 3 weeks.  Our staff will call the home number listed on your records the next business day following your procedure to check on you and address any questions or concerns that you may have at that time regarding the information given to  you following your procedure. This is a courtesy call and so if there is no answer at the home number and we have not heard from you through the emergency physician on call, we will assume that you have returned to your regular daily activities without incident.  SIGNATURES/CONFIDENTIALITY: You and/or your care partner have signed paperwork which will be entered into your electronic medical record.  These signatures attest to the fact that that the information above on your After Visit Summary has been reviewed and is understood.  Full responsibility  of the confidentiality of this discharge information lies with you and/or your care-partner.  AVOID ASPIRIN, ASPIRIN CONTAINING PRODUCTS AND NSAIDS FOR 2 WEEKS (ADVIL, MOTRIN, IBUPROFEN, ALEVE)   TYLENOL IS OK  Please continue your normal medications  Please read handout about polyps and diverticulosis

## 2013-02-03 NOTE — Progress Notes (Signed)
Lidocaine-40mg IV prior to Propofol InductionPropofol given over incremental dosages 

## 2013-02-03 NOTE — Op Note (Signed)
[facility] [address] [place], [address]   COLONOSCOPY PROCEDURE REPORT  PATIENT: Levi [name], Levi [name]  MR#: [id number] BIRTHDATE: 1945/07/09 , 66  yrs. old GENDER: Male ENDOSCOPIST: Iva Boop, MD, [facility] PROCEDURE DATE:  02/03/2013 PROCEDURE:   Colonoscopy with snare polypectomy First Screening Colonoscopy - Avg.  risk and is 50 yrs.  old or older - No.  Prior Negative Screening - Now for repeat screening. N/A Prior Negative Screening - Now for repeat screening.  10 or more years since last screening  History of Adenoma - Now for follow-up colonoscopy & has been > or = to 3 yrs.  N/A  Polyps Removed Today? Yes. ASA CLASS:   Class II INDICATIONS:Change in bowel habits. MEDICATIONS: propofol (Diprivan) 400mg  IV, MAC sedation, administered by CRNA, and These medications were titrated to patient response per physician's verbal order  DESCRIPTION OF PROCEDURE:   After the risks benefits and alternatives of the procedure were thoroughly explained, informed consent was obtained.  A digital rectal exam revealed no abnormalities of the rectum, A digital rectal exam revealed no prostatic nodules, and A digital rectal exam revealed the prostate was not enlarged.   The LB [id number]  endoscope was introduced through the anus and advanced to the cecum, which was identified by both the appendix and ileocecal valve. No adverse events experienced.   The quality of the prep was excellent using Suprep  The instrument was then slowly withdrawn as the colon was fully examined.      COLON FINDINGS: Three sessile polyps measuring 5-8 mm in size were found at the hepatic flexure, in the descending colon, and sigmoid colon.  A polypectomy was performed with a cold snare.  The resection was complete and the polyp tissue was completely retrieved.  Bleeding at the site of 8 mm sigmoid polypectomy was controlled using hemoclips.  Tip cautery used then changed to clip One (1)  placement was made.  There was minimal blood loss from maneuver subsiding by end of procedure.   Severe diverticulosis was noted in the sigmoid colon.   The colon mucosa was otherwise normal.   A right colon retroflexion was performed.  Retroflexed views revealed no abnormalities. The time to cecum=2 minutes 52 seconds.  Withdrawal time=17 minutes 20 seconds.  The scope was withdrawn and the procedure completed. COMPLICATIONS: There were no complications.  ENDOSCOPIC IMPRESSION: 1.   Three sessile polyps measuring 5-8 mm in size were found at the hepatic flexure, in the descending colon, and sigmoid colon; polypectomy was performed with a cold snare; bleeding at the 8 mm sigmoid polypectomy site was controlled using hemoclips 2.   Severe diverticulosis was noted in the sigmoid colon 3.   The colon mucosa was otherwise normal - excellent prep  RECOMMENDATIONS: 1.  Hold aspirin, aspirin products, and anti-inflammatory medication for 2 weeks. 2.  Timing of repeat colonoscopy will be determined by pathology findings. 3.   Start Benefiber 1 tablespoon daily and increase to 2-3 tablespoons daily Call soon and make an appointment to see Dr.  Leone Payor in about 6 weeks   eSigned:  Iva Boop, MD, [facility] 02/03/2013 2:44 PM   cc: Joycelyn Rua, MD and The Patient   PATIENT NAME:  Levi [name], Levi [name] MR#: [id number]

## 2013-02-04 ENCOUNTER — Telehealth: Payer: Self-pay

## 2013-02-04 NOTE — Telephone Encounter (Signed)
Left message on answering machine. 

## 2013-02-08 ENCOUNTER — Ambulatory Visit (INDEPENDENT_AMBULATORY_CARE_PROVIDER_SITE_OTHER): Payer: Medicare Other

## 2013-02-08 DIAGNOSIS — Z23 Encounter for immunization: Secondary | ICD-10-CM | POA: Diagnosis not present

## 2013-02-08 DIAGNOSIS — J309 Allergic rhinitis, unspecified: Secondary | ICD-10-CM

## 2013-02-09 ENCOUNTER — Encounter: Payer: Self-pay | Admitting: Internal Medicine

## 2013-02-09 DIAGNOSIS — Z860100 Personal history of colon polyps, unspecified: Secondary | ICD-10-CM

## 2013-02-09 HISTORY — DX: Personal history of colon polyps, unspecified: Z86.0100

## 2013-02-09 HISTORY — DX: Personal history of colonic polyps: Z86.010

## 2013-02-09 NOTE — Progress Notes (Signed)
Quick Note:  3 tubular adenomas max 8 mm Repeat colonoscopy 01/2016 ______

## 2013-02-12 DIAGNOSIS — IMO0002 Reserved for concepts with insufficient information to code with codable children: Secondary | ICD-10-CM | POA: Diagnosis not present

## 2013-02-12 DIAGNOSIS — M47817 Spondylosis without myelopathy or radiculopathy, lumbosacral region: Secondary | ICD-10-CM | POA: Diagnosis not present

## 2013-02-15 ENCOUNTER — Ambulatory Visit (INDEPENDENT_AMBULATORY_CARE_PROVIDER_SITE_OTHER): Payer: Medicare Other

## 2013-02-15 DIAGNOSIS — J309 Allergic rhinitis, unspecified: Secondary | ICD-10-CM | POA: Diagnosis not present

## 2013-02-18 ENCOUNTER — Telehealth: Payer: Self-pay | Admitting: *Deleted

## 2013-02-18 NOTE — Telephone Encounter (Signed)
Received PA for protonix BID. This was approved from 02/07/13-02/08/13. I have called and made [facility] aware of approved. I spoke with Amber from [facility]. Nothing further needed

## 2013-02-22 ENCOUNTER — Ambulatory Visit (INDEPENDENT_AMBULATORY_CARE_PROVIDER_SITE_OTHER): Payer: Medicare Other

## 2013-02-22 DIAGNOSIS — J309 Allergic rhinitis, unspecified: Secondary | ICD-10-CM | POA: Diagnosis not present

## 2013-03-01 ENCOUNTER — Ambulatory Visit (INDEPENDENT_AMBULATORY_CARE_PROVIDER_SITE_OTHER): Payer: Medicare Other

## 2013-03-01 DIAGNOSIS — J309 Allergic rhinitis, unspecified: Secondary | ICD-10-CM

## 2013-03-05 DIAGNOSIS — M722 Plantar fascial fibromatosis: Secondary | ICD-10-CM | POA: Diagnosis not present

## 2013-03-08 ENCOUNTER — Ambulatory Visit (INDEPENDENT_AMBULATORY_CARE_PROVIDER_SITE_OTHER): Payer: Medicare Other

## 2013-03-08 DIAGNOSIS — J309 Allergic rhinitis, unspecified: Secondary | ICD-10-CM | POA: Diagnosis not present

## 2013-03-15 ENCOUNTER — Ambulatory Visit (INDEPENDENT_AMBULATORY_CARE_PROVIDER_SITE_OTHER): Payer: Medicare Other

## 2013-03-15 DIAGNOSIS — J309 Allergic rhinitis, unspecified: Secondary | ICD-10-CM

## 2013-03-18 ENCOUNTER — Ambulatory Visit (INDEPENDENT_AMBULATORY_CARE_PROVIDER_SITE_OTHER): Payer: Medicare Other | Admitting: Cardiology

## 2013-03-18 ENCOUNTER — Encounter: Payer: Self-pay | Admitting: Cardiology

## 2013-03-18 VITALS — BP 134/86 | HR 55 | Ht 74.0 in | Wt 152.0 lb

## 2013-03-18 DIAGNOSIS — R0602 Shortness of breath: Secondary | ICD-10-CM | POA: Diagnosis not present

## 2013-03-18 DIAGNOSIS — E785 Hyperlipidemia, unspecified: Secondary | ICD-10-CM | POA: Diagnosis not present

## 2013-03-18 DIAGNOSIS — I4891 Unspecified atrial fibrillation: Secondary | ICD-10-CM | POA: Diagnosis not present

## 2013-03-18 DIAGNOSIS — J449 Chronic obstructive pulmonary disease, unspecified: Secondary | ICD-10-CM | POA: Diagnosis not present

## 2013-03-18 DIAGNOSIS — J4489 Other specified chronic obstructive pulmonary disease: Secondary | ICD-10-CM
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
## 2013-03-18 NOTE — Patient Instructions (Signed)
Your physician recommends that you continue on your current medications as directed. Please refer to the Current Medication list given to you today.  Your physician wants you to follow-up in: 6 months. You will receive a reminder letter in the mail two months in advance. If you don't receive a letter, please call our office to schedule the follow-up appointment.  

## 2013-03-19 ENCOUNTER — Encounter: Payer: Self-pay | Admitting: Internal Medicine

## 2013-03-19 ENCOUNTER — Encounter: Payer: Self-pay | Admitting: Cardiology

## 2013-03-19 ENCOUNTER — Ambulatory Visit (INDEPENDENT_AMBULATORY_CARE_PROVIDER_SITE_OTHER): Payer: Medicare Other | Admitting: Internal Medicine

## 2013-03-19 VITALS — BP 110/60 | HR 60 | Ht 73.25 in | Wt 162.1 lb

## 2013-03-19 DIAGNOSIS — R198 Other specified symptoms and signs involving the digestive system and abdomen: Secondary | ICD-10-CM

## 2013-03-19 DIAGNOSIS — R151 Fecal smearing: Secondary | ICD-10-CM | POA: Diagnosis not present

## 2013-03-19 DIAGNOSIS — R194 Change in bowel habit: Secondary | ICD-10-CM

## 2013-03-19 NOTE — Progress Notes (Signed)
  Subjective:    Patient ID: Levi [name], male    DOB: June 30, 1945, 67 y.o.   MRN: [id number]  HPI Patient is here for followup. He was seen for change in bowel habits and some fecal soilage and ended up having a colonoscopy which demonstrated adenomatous polyps. He is due for followup colonoscopy on a routine basis in about 3 years. He was started on Benefiber supplementation and says things are going well though he does not have complete resolution of his symptoms are minimal and he feels good.  Medications, allergies, past medical history, past surgical history, family history and social history are reviewed and updated in the EMR.  Review of Systems As per history of present illness    Objective:   Physical Exam Tall thin well-developed middle-aged man in no acute distress       Assessment & Plan:   1. Fecal soiling   2. Change in bowel habits    He is much improved and we'll continue on the fiber supplementation and see me as needed and routinely in about 3 years for repeat routine colonoscopy.   Current outpatient prescriptions:albuterol (PROAIR HFA) 108 (90 BASE) MCG/ACT inhaler, Inhale 2 puffs into the lungs every 4 (four) hours as needed for wheezing., Disp: 3 Inhaler, Rfl: 3;  allopurinol (ZYLOPRIM) 100 MG tablet, Take 100 mg by mouth daily.  , Disp: , Rfl: ;  atorvastatin (LIPITOR) 10 MG tablet, Take 10 mg by mouth daily. , Disp: , Rfl: ;  candesartan (ATACAND) 4 MG tablet, Take 2 mg by mouth daily. , Disp: , Rfl:  cyclobenzaprine (FLEXERIL) 10 MG tablet, Take 10 mg by mouth as needed. , Disp: , Rfl: ;  diltiazem (CARDIZEM CD) 180 MG 24 hr capsule, Take 180 mg by mouth daily., Disp: , Rfl: ;  FORADIL AEROLIZER 12 MCG capsule for inhaler, PLACE 1 CAPSULE (12 MCG TOTAL) INTO INHALER AND INHALE 2 (TWO) TIMES DAILY., Disp: 60 capsule, Rfl: 11;  isosorbide mononitrate (IMDUR) 30 MG 24 hr tablet, Take 30 mg by mouth daily. , Disp: , Rfl:  pantoprazole (PROTONIX) 40 MG tablet, TAKE 1  TABLET BY MOUTH TWICE A DAY, Disp: 60 tablet, Rfl: 11;  predniSONE (DELTASONE) 10 MG tablet, Take 1 tablet (10 mg total) by mouth daily., Disp: 90 tablet, Rfl: 3;  QVAR 80 MCG/ACT inhaler, INHALE 2 PUFFS INTO THE LUNGS 2 (TWO) TIMES DAILY., Disp: 8.7 g, Rfl: 11;  Wheat Dextrin (BENEFIBER PO), Take by mouth. 1-3 tablespoons daily, Disp: , Rfl:   I appreciate the opportunity to care for this patient.  CC: Joycelyn Rua, MD

## 2013-03-19 NOTE — Patient Instructions (Signed)
Glad to hear things are better. You can adjust the amount of the fiber supplement to help things more, if needed. See you in 2017 - call back sooner if needed.  I appreciate the opportunity to care for you. Iva Boop, MD, [facility]

## 2013-03-22 ENCOUNTER — Ambulatory Visit (INDEPENDENT_AMBULATORY_CARE_PROVIDER_SITE_OTHER): Payer: Medicare Other

## 2013-03-22 DIAGNOSIS — J309 Allergic rhinitis, unspecified: Secondary | ICD-10-CM | POA: Diagnosis not present

## 2013-03-24 DIAGNOSIS — M542 Cervicalgia: Secondary | ICD-10-CM | POA: Diagnosis not present

## 2013-03-24 DIAGNOSIS — M25519 Pain in unspecified shoulder: Secondary | ICD-10-CM | POA: Diagnosis not present

## 2013-03-29 ENCOUNTER — Ambulatory Visit (INDEPENDENT_AMBULATORY_CARE_PROVIDER_SITE_OTHER): Payer: Medicare Other

## 2013-03-29 DIAGNOSIS — J309 Allergic rhinitis, unspecified: Secondary | ICD-10-CM

## 2013-04-05 ENCOUNTER — Ambulatory Visit (INDEPENDENT_AMBULATORY_CARE_PROVIDER_SITE_OTHER): Payer: Medicare Other

## 2013-04-05 DIAGNOSIS — J309 Allergic rhinitis, unspecified: Secondary | ICD-10-CM

## 2013-04-07 ENCOUNTER — Ambulatory Visit: Payer: Medicare Other | Admitting: Cardiology

## 2013-04-12 ENCOUNTER — Ambulatory Visit (INDEPENDENT_AMBULATORY_CARE_PROVIDER_SITE_OTHER): Payer: Medicare Other

## 2013-04-12 DIAGNOSIS — J309 Allergic rhinitis, unspecified: Secondary | ICD-10-CM

## 2013-04-15 DIAGNOSIS — M1710 Unilateral primary osteoarthritis, unspecified knee: Secondary | ICD-10-CM | POA: Diagnosis not present

## 2013-04-15 DIAGNOSIS — M722 Plantar fascial fibromatosis: Secondary | ICD-10-CM | POA: Diagnosis not present

## 2013-04-19 ENCOUNTER — Ambulatory Visit (INDEPENDENT_AMBULATORY_CARE_PROVIDER_SITE_OTHER): Payer: Medicare Other

## 2013-04-19 DIAGNOSIS — J309 Allergic rhinitis, unspecified: Secondary | ICD-10-CM | POA: Diagnosis not present

## 2013-04-26 ENCOUNTER — Ambulatory Visit: Payer: Medicare Other

## 2013-04-27 ENCOUNTER — Ambulatory Visit (INDEPENDENT_AMBULATORY_CARE_PROVIDER_SITE_OTHER): Payer: Medicare Other

## 2013-04-27 DIAGNOSIS — J309 Allergic rhinitis, unspecified: Secondary | ICD-10-CM

## 2013-04-27 DIAGNOSIS — IMO0002 Reserved for concepts with insufficient information to code with codable children: Secondary | ICD-10-CM | POA: Diagnosis not present

## 2013-04-28 ENCOUNTER — Ambulatory Visit (INDEPENDENT_AMBULATORY_CARE_PROVIDER_SITE_OTHER): Payer: Medicare Other

## 2013-04-28 DIAGNOSIS — J309 Allergic rhinitis, unspecified: Secondary | ICD-10-CM

## 2013-05-03 ENCOUNTER — Ambulatory Visit (INDEPENDENT_AMBULATORY_CARE_PROVIDER_SITE_OTHER): Payer: Medicare Other

## 2013-05-03 DIAGNOSIS — J309 Allergic rhinitis, unspecified: Secondary | ICD-10-CM

## 2013-05-10 ENCOUNTER — Ambulatory Visit (INDEPENDENT_AMBULATORY_CARE_PROVIDER_SITE_OTHER): Payer: Medicare Other

## 2013-05-10 DIAGNOSIS — IMO0002 Reserved for concepts with insufficient information to code with codable children: Secondary | ICD-10-CM | POA: Diagnosis not present

## 2013-05-10 DIAGNOSIS — M47817 Spondylosis without myelopathy or radiculopathy, lumbosacral region: Secondary | ICD-10-CM | POA: Diagnosis not present

## 2013-05-10 DIAGNOSIS — M4180 Other forms of scoliosis, site unspecified: Secondary | ICD-10-CM | POA: Diagnosis not present

## 2013-05-10 DIAGNOSIS — Z981 Arthrodesis status: Secondary | ICD-10-CM | POA: Diagnosis not present

## 2013-05-10 DIAGNOSIS — J309 Allergic rhinitis, unspecified: Secondary | ICD-10-CM

## 2013-05-10 DIAGNOSIS — M4120 Other idiopathic scoliosis, site unspecified: Secondary | ICD-10-CM | POA: Diagnosis not present

## 2013-05-14 ENCOUNTER — Other Ambulatory Visit: Payer: Self-pay | Admitting: Neurological Surgery

## 2013-05-14 ENCOUNTER — Other Ambulatory Visit (HOSPITAL_COMMUNITY): Payer: Self-pay | Admitting: Neurological Surgery

## 2013-05-14 DIAGNOSIS — M5410 Radiculopathy, site unspecified: Secondary | ICD-10-CM

## 2013-05-14 DIAGNOSIS — IMO0002 Reserved for concepts with insufficient information to code with codable children: Secondary | ICD-10-CM

## 2013-05-17 ENCOUNTER — Ambulatory Visit (INDEPENDENT_AMBULATORY_CARE_PROVIDER_SITE_OTHER): Payer: Medicare Other

## 2013-05-17 DIAGNOSIS — J309 Allergic rhinitis, unspecified: Secondary | ICD-10-CM | POA: Diagnosis not present

## 2013-05-18 ENCOUNTER — Encounter (HOSPITAL_COMMUNITY): Payer: Self-pay | Admitting: Pharmacy Technician

## 2013-05-21 ENCOUNTER — Ambulatory Visit (HOSPITAL_COMMUNITY)
Admission: RE | Admit: 2013-05-21 | Discharge: 2013-05-21 | Disposition: A | Discharge status: Home / Self Care | Payer: Medicare Other | Source: Ambulatory Visit | Attending: Neurological Surgery | Admitting: Neurological Surgery

## 2013-05-21 DIAGNOSIS — M5137 Other intervertebral disc degeneration, lumbosacral region: Secondary | ICD-10-CM | POA: Insufficient documentation

## 2013-05-21 DIAGNOSIS — I700 Atherosclerosis of aorta: Secondary | ICD-10-CM | POA: Insufficient documentation

## 2013-05-21 DIAGNOSIS — Z981 Arthrodesis status: Secondary | ICD-10-CM | POA: Insufficient documentation

## 2013-05-21 DIAGNOSIS — M619 Calcification and ossification of muscle, unspecified: Secondary | ICD-10-CM | POA: Diagnosis not present

## 2013-05-21 DIAGNOSIS — M47817 Spondylosis without myelopathy or radiculopathy, lumbosacral region: Secondary | ICD-10-CM | POA: Diagnosis not present

## 2013-05-21 DIAGNOSIS — M51379 Other intervertebral disc degeneration, lumbosacral region without mention of lumbar back pain or lower extremity pain: Secondary | ICD-10-CM | POA: Insufficient documentation

## 2013-05-21 DIAGNOSIS — IMO0002 Reserved for concepts with insufficient information to code with codable children: Secondary | ICD-10-CM

## 2013-05-21 DIAGNOSIS — M545 Low back pain, unspecified: Secondary | ICD-10-CM | POA: Diagnosis not present

## 2013-05-21 DIAGNOSIS — M5410 Radiculopathy, site unspecified: Secondary | ICD-10-CM

## 2013-05-21 MED ORDER — HYDROCODONE-ACETAMINOPHEN 5-325 MG PO TABS
1.0000 | ORAL_TABLET | ORAL | Status: DC | PRN
Start: 2013-05-21 — End: 2013-05-22
  Administered 2013-05-21: 2 via ORAL

## 2013-05-21 MED ORDER — DIAZEPAM 5 MG PO TABS
10.0000 mg | ORAL_TABLET | Freq: Once | ORAL | Status: AC
  Administered 2013-05-21: 10 mg via ORAL
  Filled 2013-05-21: qty 2

## 2013-05-21 MED ORDER — IOHEXOL 180 MG/ML  SOLN
20.0000 mL | Freq: Once | INTRAMUSCULAR | Status: AC | PRN
  Administered 2013-05-21: 14 mL via INTRATHECAL

## 2013-05-21 MED ORDER — ONDANSETRON HCL 4 MG/2ML IJ SOLN: 4.0000 mg | Freq: Four times a day (QID) | INTRAMUSCULAR | Status: DC | PRN

## 2013-05-21 MED ORDER — HYDROCODONE-ACETAMINOPHEN 5-325 MG PO TABS
ORAL_TABLET | ORAL | Status: AC
  Filled 2013-05-21: qty 2

## 2013-05-21 NOTE — Procedures (Signed)
Patient is a 67 year old individual is had significant back and lower extremity pain. Pain is worse on the left side than on the right side. About 7 years ago I did a decompression and fusion Levi [name] for degenerative scoliosis. He has done well from that surgery however he had return of pain in the past years time. Is difficult to visualize retina hardware myelogram is now being performed.  Pre op Dx: Lumbar scoliosis with radiculopathy. Post op Dx: Lumbar scoliosis with radiculopathy. Procedure: Lumbar myelogram Surgeon: Danielle Dess Puncture level: L2-3 Fluid color: Clear colorless Injection: Iohexol 180 14 cc Findings: No obvious root cut off on plain myelogram films, 4 CT scanning.

## 2013-05-21 NOTE — Progress Notes (Signed)
D/C home with wife; denies discomfort; puncture site unremarkable

## 2013-05-24 ENCOUNTER — Ambulatory Visit (INDEPENDENT_AMBULATORY_CARE_PROVIDER_SITE_OTHER): Payer: Medicare Other

## 2013-05-24 DIAGNOSIS — J309 Allergic rhinitis, unspecified: Secondary | ICD-10-CM | POA: Diagnosis not present

## 2013-05-31 ENCOUNTER — Ambulatory Visit (INDEPENDENT_AMBULATORY_CARE_PROVIDER_SITE_OTHER): Payer: Medicare Other

## 2013-05-31 DIAGNOSIS — J309 Allergic rhinitis, unspecified: Secondary | ICD-10-CM

## 2013-06-01 ENCOUNTER — Other Ambulatory Visit: Payer: Self-pay | Admitting: Neurological Surgery

## 2013-06-01 DIAGNOSIS — IMO0002 Reserved for concepts with insufficient information to code with codable children: Secondary | ICD-10-CM | POA: Diagnosis not present

## 2013-06-03 DIAGNOSIS — M25569 Pain in unspecified knee: Secondary | ICD-10-CM | POA: Diagnosis not present

## 2013-06-03 DIAGNOSIS — M25579 Pain in unspecified ankle and joints of unspecified foot: Secondary | ICD-10-CM | POA: Diagnosis not present

## 2013-06-03 DIAGNOSIS — M722 Plantar fascial fibromatosis: Secondary | ICD-10-CM | POA: Diagnosis not present

## 2013-06-07 ENCOUNTER — Ambulatory Visit (INDEPENDENT_AMBULATORY_CARE_PROVIDER_SITE_OTHER): Payer: Medicare Other

## 2013-06-07 DIAGNOSIS — J309 Allergic rhinitis, unspecified: Secondary | ICD-10-CM

## 2013-06-09 NOTE — Pre-Procedure Instructions (Signed)
BRONC BROSSEAU  06/09/2013   Your procedure is scheduled on:  Tues, Jan 20 @ 11:40 AM  Report to [facility] at 8:30 AM.  Call this number if you have problems the morning of surgery: [phone]   Remember:   Do not eat food or drink liquids after midnight.   Take these medicines the morning of surgery with A SIP OF WATER: Albuterol<Bring Your Inhaler With You>,Allopurinol(Zyloprim),QVAR(Beclomethasone),Diltiazem(Cardizem),Isosorbide(Imdur),Prednisone(Deltasone),and Pantoprazole(Protonix)                  No Goody's,BC's,Aleve,Aspirin,Ibuprofen,Fish Oil,or any Herbal Medications   Do not wear jewelry  Do not wear lotions, powders, or colognes. You may wear deodorant.  Men may shave face and neck.  Do not bring valuables to the hospital.  [facility] is not responsible                  for any belongings or valuables.               Contacts, dentures or bridgework may not be worn into surgery.  Leave suitcase in the car. After surgery it may be brought to your room.  For patients admitted to the hospital, discharge time is determined by your                treatment team.                 Special Instructions: Shower using CHG 2 nights before surgery and the night before surgery.  If you shower the day of surgery use CHG.  Use special wash - you have one bottle of CHG for all showers.  You should use approximately 1/3 of the bottle for each shower.   Please read over the following fact sheets that you were given: Pain Booklet, Coughing and Deep Breathing, Blood Transfusion Information, MRSA Information and Surgical Site Infection Prevention

## 2013-06-10 ENCOUNTER — Encounter (HOSPITAL_COMMUNITY): Payer: Self-pay

## 2013-06-10 ENCOUNTER — Encounter (HOSPITAL_COMMUNITY)
Admission: RE | Admit: 2013-06-10 | Discharge: 2013-06-10 | Disposition: A | Discharge status: Home / Self Care | Payer: Medicare Other | Source: Ambulatory Visit | Attending: Neurological Surgery | Admitting: Neurological Surgery

## 2013-06-10 ENCOUNTER — Encounter (HOSPITAL_COMMUNITY)
Admission: RE | Admit: 2013-06-10 | Discharge: 2013-06-10 | Disposition: A | Discharge status: Home / Self Care | Payer: Medicare Other | Source: Ambulatory Visit | Attending: Anesthesiology | Admitting: Anesthesiology

## 2013-06-10 DIAGNOSIS — Z01812 Encounter for preprocedural laboratory examination: Secondary | ICD-10-CM | POA: Diagnosis not present

## 2013-06-10 DIAGNOSIS — Z01818 Encounter for other preprocedural examination: Secondary | ICD-10-CM | POA: Diagnosis not present

## 2013-06-10 DIAGNOSIS — R918 Other nonspecific abnormal finding of lung field: Secondary | ICD-10-CM | POA: Diagnosis not present

## 2013-06-10 LAB — SURGICAL PCR SCREEN
MRSA, PCR: NEGATIVE
Staphylococcus aureus: NEGATIVE

## 2013-06-10 LAB — TYPE AND SCREEN
ABO/RH(D): O POS
Antibody Screen: NEGATIVE

## 2013-06-10 LAB — BASIC METABOLIC PANEL
BUN: 22 mg/dL (ref 6–23)
CO2: 25 mEq/L (ref 19–32)
Calcium: 9.9 mg/dL (ref 8.4–10.5)
Chloride: 108 mEq/L (ref 96–112)
Creatinine, Ser: 0.78 mg/dL (ref 0.50–1.35)
GFR calc Af Amer: >90 mL/min (ref >90)
GFR calc non Af Amer: >90 mL/min (ref >90)
Glucose, Bld: 115 mg/dL — ABNORMAL HIGH (ref 70–99)
Potassium: 4.4 mEq/L (ref 3.7–5.3)
Sodium: 145 mEq/L (ref 137–147)

## 2013-06-10 LAB — CBC
HCT: 40.5 % (ref 39.0–52.0)
Hemoglobin: 13.5 g/dL (ref 13.0–17.0)
MCH: 31.1 pg (ref 26.0–34.0)
MCHC: 33.3 g/dL (ref 30.0–36.0)
MCV: 93.3 fL (ref 78.0–100.0)
Platelets: 180 10*3/uL (ref 150–400)
RBC: 4.34 MIL/uL (ref 4.22–5.81)
RDW: 15.4 % (ref 11.5–15.5)
WBC: 8.7 10*3/uL (ref 4.0–10.5)

## 2013-06-10 NOTE — Progress Notes (Addendum)
Dr.Skains is Cardiologist with last visit in epic from Oct 2014  Echo report in epic from 05-10-12  Heart cath done in 2010  Stress done around 2010  Medical MD is Dr.Stephen Ladell Pier is Pulmonologist  Denies CXR in past yr

## 2013-06-14 ENCOUNTER — Ambulatory Visit: Payer: Medicare Other

## 2013-06-14 MED ORDER — CEFAZOLIN SODIUM-DEXTROSE 2-3 GM-% IV SOLR
2.0000 g | INTRAVENOUS | Status: AC
  Administered 2013-06-15: 2 g via INTRAVENOUS
  Filled 2013-06-14: qty 50

## 2013-06-15 ENCOUNTER — Inpatient Hospital Stay (HOSPITAL_COMMUNITY): Payer: Medicare Other

## 2013-06-15 ENCOUNTER — Encounter (HOSPITAL_COMMUNITY): Payer: Self-pay | Admitting: *Deleted

## 2013-06-15 ENCOUNTER — Inpatient Hospital Stay (HOSPITAL_COMMUNITY): Payer: Medicare Other | Admitting: Critical Care Medicine

## 2013-06-15 ENCOUNTER — Inpatient Hospital Stay (HOSPITAL_COMMUNITY)
Admission: RE | Admit: 2013-06-15 | Discharge: 2013-06-18 | DRG: 460 | Disposition: A | Discharge status: Home / Self Care | Payer: Medicare Other | Source: Ambulatory Visit | Attending: Neurological Surgery | Admitting: Neurological Surgery

## 2013-06-15 ENCOUNTER — Encounter (HOSPITAL_COMMUNITY): Payer: Medicare Other | Admitting: Critical Care Medicine

## 2013-06-15 ENCOUNTER — Encounter (HOSPITAL_COMMUNITY)
Admission: RE | Disposition: A | Discharge status: Home / Self Care | Payer: Medicare Other | Source: Ambulatory Visit | Attending: Neurological Surgery

## 2013-06-15 DIAGNOSIS — J449 Chronic obstructive pulmonary disease, unspecified: Secondary | ICD-10-CM | POA: Diagnosis not present

## 2013-06-15 DIAGNOSIS — S32009K Unspecified fracture of unspecified lumbar vertebra, subsequent encounter for fracture with nonunion: Secondary | ICD-10-CM | POA: Diagnosis present

## 2013-06-15 DIAGNOSIS — K219 Gastro-esophageal reflux disease without esophagitis: Secondary | ICD-10-CM | POA: Diagnosis present

## 2013-06-15 DIAGNOSIS — J4489 Other specified chronic obstructive pulmonary disease: Secondary | ICD-10-CM | POA: Diagnosis not present

## 2013-06-15 DIAGNOSIS — D62 Acute posthemorrhagic anemia: Secondary | ICD-10-CM | POA: Diagnosis not present

## 2013-06-15 DIAGNOSIS — IMO0002 Reserved for concepts with insufficient information to code with codable children: Secondary | ICD-10-CM | POA: Diagnosis present

## 2013-06-15 DIAGNOSIS — Z96649 Presence of unspecified artificial hip joint: Secondary | ICD-10-CM

## 2013-06-15 DIAGNOSIS — M47817 Spondylosis without myelopathy or radiculopathy, lumbosacral region: Secondary | ICD-10-CM | POA: Diagnosis not present

## 2013-06-15 DIAGNOSIS — Z79899 Other long term (current) drug therapy: Secondary | ICD-10-CM | POA: Diagnosis not present

## 2013-06-15 DIAGNOSIS — Z9189 Other specified personal risk factors, not elsewhere classified: Secondary | ICD-10-CM | POA: Diagnosis not present

## 2013-06-15 DIAGNOSIS — E785 Hyperlipidemia, unspecified: Secondary | ICD-10-CM | POA: Diagnosis present

## 2013-06-15 DIAGNOSIS — R262 Difficulty in walking, not elsewhere classified: Secondary | ICD-10-CM | POA: Diagnosis present

## 2013-06-15 DIAGNOSIS — Z96659 Presence of unspecified artificial knee joint: Secondary | ICD-10-CM

## 2013-06-15 DIAGNOSIS — M539 Dorsopathy, unspecified: Secondary | ICD-10-CM | POA: Diagnosis not present

## 2013-06-15 SURGERY — POSTERIOR LUMBAR FUSION 2 WITH HARDWARE REMOVAL
Anesthesia: General | Site: Back

## 2013-06-15 MED ORDER — 0.9 % SODIUM CHLORIDE (POUR BTL) OPTIME
TOPICAL | Status: DC | PRN
  Administered 2013-06-15: 1000 mL

## 2013-06-15 MED ORDER — DILTIAZEM HCL ER COATED BEADS 180 MG PO CP24
180.0000 mg | ORAL_CAPSULE | Freq: Every day | ORAL | Status: DC
  Administered 2013-06-16 – 2013-06-18 (×3): 180 mg via ORAL
  Filled 2013-06-15 (×3): qty 1

## 2013-06-15 MED ORDER — OXYCODONE-ACETAMINOPHEN 5-325 MG PO TABS
1.0000 | ORAL_TABLET | ORAL | Status: DC | PRN
  Administered 2013-06-15: 2 via ORAL
  Filled 2013-06-15 (×2): qty 2

## 2013-06-15 MED ORDER — NEOSTIGMINE METHYLSULFATE 1 MG/ML IJ SOLN
INTRAMUSCULAR | Status: DC | PRN
  Administered 2013-06-15: 3 mg via INTRAVENOUS

## 2013-06-15 MED ORDER — ACETAMINOPHEN 650 MG RE SUPP: 650.0000 mg | RECTAL | Status: DC | PRN

## 2013-06-15 MED ORDER — ALLOPURINOL 100 MG PO TABS
100.0000 mg | ORAL_TABLET | Freq: Every day | ORAL | Status: DC
  Administered 2013-06-16 – 2013-06-18 (×3): 100 mg via ORAL
  Filled 2013-06-15 (×3): qty 1

## 2013-06-15 MED ORDER — PREDNISONE 10 MG PO TABS
10.0000 mg | ORAL_TABLET | Freq: Every day | ORAL | Status: DC
  Administered 2013-06-16 – 2013-06-18 (×3): 10 mg via ORAL
  Filled 2013-06-15 (×4): qty 1

## 2013-06-15 MED ORDER — PHENOL 1.4 % MT LIQD: 1.0000 | OROMUCOSAL | Status: DC | PRN

## 2013-06-15 MED ORDER — LACTATED RINGERS IV SOLN
INTRAVENOUS | Status: DC
  Administered 2013-06-15 (×3): via INTRAVENOUS

## 2013-06-15 MED ORDER — CEFAZOLIN SODIUM-DEXTROSE 2-3 GM-% IV SOLR
INTRAVENOUS | Status: AC
  Administered 2013-06-15: 2 g via INTRAVENOUS
  Filled 2013-06-15: qty 50

## 2013-06-15 MED ORDER — HYDROCODONE-ACETAMINOPHEN 5-325 MG PO TABS: 1.0000 | ORAL_TABLET | ORAL | Status: DC | PRN

## 2013-06-15 MED ORDER — CYCLOBENZAPRINE HCL 10 MG PO TABS: 10.0000 mg | ORAL_TABLET | Freq: Three times a day (TID) | ORAL | Status: DC | PRN

## 2013-06-15 MED ORDER — ARTIFICIAL TEARS OP OINT
TOPICAL_OINTMENT | OPHTHALMIC | Status: DC | PRN
  Administered 2013-06-15: 1 via OPHTHALMIC

## 2013-06-15 MED ORDER — LIDOCAINE HCL (CARDIAC) 20 MG/ML IV SOLN
INTRAVENOUS | Status: DC | PRN
  Administered 2013-06-15: 60 mg via INTRAVENOUS

## 2013-06-15 MED ORDER — DEXAMETHASONE SODIUM PHOSPHATE 4 MG/ML IJ SOLN
INTRAMUSCULAR | Status: DC | PRN
  Administered 2013-06-15: 4 mg via INTRAVENOUS

## 2013-06-15 MED ORDER — SODIUM CHLORIDE 0.9 % IJ SOLN: 3.0000 mL | INTRAMUSCULAR | Status: DC | PRN

## 2013-06-15 MED ORDER — ACETAMINOPHEN 325 MG PO TABS: 650.0000 mg | ORAL_TABLET | ORAL | Status: DC | PRN

## 2013-06-15 MED ORDER — METOCLOPRAMIDE HCL 5 MG/ML IJ SOLN: 10.0000 mg | Freq: Once | INTRAMUSCULAR | Status: DC | PRN

## 2013-06-15 MED ORDER — ALBUTEROL SULFATE (2.5 MG/3ML) 0.083% IN NEBU: 2.5000 mg | INHALATION_SOLUTION | RESPIRATORY_TRACT | Status: DC | PRN

## 2013-06-15 MED ORDER — LIDOCAINE-EPINEPHRINE 1 %-1:100000 IJ SOLN
INTRAMUSCULAR | Status: DC | PRN
  Administered 2013-06-15: 5 mL

## 2013-06-15 MED ORDER — SODIUM CHLORIDE 0.9 % IV SOLN
INTRAVENOUS | Status: DC
  Administered 2013-06-15: 22:00:00 via INTRAVENOUS

## 2013-06-15 MED ORDER — MIDAZOLAM HCL 5 MG/5ML IJ SOLN
INTRAMUSCULAR | Status: DC | PRN
  Administered 2013-06-15: 2 mg via INTRAVENOUS

## 2013-06-15 MED ORDER — OXYCODONE HCL 5 MG/5ML PO SOLN: 5.0000 mg | Freq: Once | ORAL | Status: DC | PRN

## 2013-06-15 MED ORDER — SODIUM CHLORIDE 0.9 % IV SOLN: 250.0000 mL | INTRAVENOUS | Status: DC

## 2013-06-15 MED ORDER — CEFAZOLIN SODIUM 1-5 GM-% IV SOLN
1.0000 g | Freq: Three times a day (TID) | INTRAVENOUS | Status: AC
  Administered 2013-06-16 (×2): 1 g via INTRAVENOUS
  Filled 2013-06-15 (×2): qty 50

## 2013-06-15 MED ORDER — PROPOFOL 10 MG/ML IV BOLUS
INTRAVENOUS | Status: DC | PRN
  Administered 2013-06-15: 200 mg via INTRAVENOUS

## 2013-06-15 MED ORDER — IRBESARTAN 75 MG PO TABS
75.0000 mg | ORAL_TABLET | Freq: Every day | ORAL | Status: DC
  Administered 2013-06-15 – 2013-06-18 (×4): 75 mg via ORAL
  Filled 2013-06-15 (×4): qty 1

## 2013-06-15 MED ORDER — ROCURONIUM BROMIDE 100 MG/10ML IV SOLN
INTRAVENOUS | Status: DC | PRN
  Administered 2013-06-15: 10 mg via INTRAVENOUS
  Administered 2013-06-15: 50 mg via INTRAVENOUS
  Administered 2013-06-15 (×5): 10 mg via INTRAVENOUS

## 2013-06-15 MED ORDER — ALBUTEROL SULFATE HFA 108 (90 BASE) MCG/ACT IN AERS: 2.0000 | INHALATION_SPRAY | RESPIRATORY_TRACT | Status: DC | PRN

## 2013-06-15 MED ORDER — MENTHOL 3 MG MT LOZG: 1.0000 | LOZENGE | OROMUCOSAL | Status: DC | PRN

## 2013-06-15 MED ORDER — ONDANSETRON HCL 4 MG/2ML IJ SOLN: 4.0000 mg | INTRAMUSCULAR | Status: DC | PRN

## 2013-06-15 MED ORDER — METHOCARBAMOL 100 MG/ML IJ SOLN
500.0000 mg | Freq: Four times a day (QID) | INTRAVENOUS | Status: DC | PRN
  Filled 2013-06-15: qty 5

## 2013-06-15 MED ORDER — FENTANYL CITRATE 0.05 MG/ML IJ SOLN
INTRAMUSCULAR | Status: DC | PRN
  Administered 2013-06-15: 100 ug via INTRAVENOUS
  Administered 2013-06-15 (×2): 50 ug via INTRAVENOUS
  Administered 2013-06-15: 100 ug via INTRAVENOUS
  Administered 2013-06-15 (×2): 50 ug via INTRAVENOUS

## 2013-06-15 MED ORDER — PANTOPRAZOLE SODIUM 40 MG PO TBEC
40.0000 mg | DELAYED_RELEASE_TABLET | Freq: Two times a day (BID) | ORAL | Status: DC
Start: 2013-06-15 — End: 2013-06-18
  Administered 2013-06-15 – 2013-06-18 (×6): 40 mg via ORAL
  Filled 2013-06-15 (×3): qty 1

## 2013-06-15 MED ORDER — BUPIVACAINE HCL (PF) 0.5 % IJ SOLN
INTRAMUSCULAR | Status: DC | PRN
  Administered 2013-06-15: 20 mL
  Administered 2013-06-15: 5 mL

## 2013-06-15 MED ORDER — HYDROMORPHONE HCL PF 1 MG/ML IJ SOLN
0.2500 mg | INTRAMUSCULAR | Status: DC | PRN
  Administered 2013-06-15 (×2): 0.5 mg via INTRAVENOUS

## 2013-06-15 MED ORDER — ONDANSETRON HCL 4 MG/2ML IJ SOLN
INTRAMUSCULAR | Status: DC | PRN
  Administered 2013-06-15: 4 mg via INTRAVENOUS

## 2013-06-15 MED ORDER — HYDROMORPHONE HCL PF 1 MG/ML IJ SOLN
INTRAMUSCULAR | Status: AC
  Filled 2013-06-15: qty 1

## 2013-06-15 MED ORDER — MORPHINE SULFATE 2 MG/ML IJ SOLN: 1.0000 mg | INTRAMUSCULAR | Status: DC | PRN

## 2013-06-15 MED ORDER — GLYCOPYRROLATE 0.2 MG/ML IJ SOLN
INTRAMUSCULAR | Status: DC | PRN
  Administered 2013-06-15: 0.4 mg via INTRAVENOUS

## 2013-06-15 MED ORDER — KETOROLAC TROMETHAMINE 15 MG/ML IJ SOLN
15.0000 mg | Freq: Four times a day (QID) | INTRAMUSCULAR | Status: AC
  Administered 2013-06-15 – 2013-06-16 (×5): 15 mg via INTRAVENOUS
  Filled 2013-06-15 (×6): qty 1

## 2013-06-15 MED ORDER — OXYCODONE HCL 5 MG PO TABS: 5.0000 mg | ORAL_TABLET | Freq: Once | ORAL | Status: DC | PRN

## 2013-06-15 MED ORDER — FLUTICASONE PROPIONATE HFA 44 MCG/ACT IN AERO
2.0000 | INHALATION_SPRAY | Freq: Two times a day (BID) | RESPIRATORY_TRACT | Status: DC
  Administered 2013-06-15 – 2013-06-18 (×6): 2 via RESPIRATORY_TRACT
  Filled 2013-06-15: qty 10.6

## 2013-06-15 MED ORDER — ISOSORBIDE MONONITRATE ER 30 MG PO TB24
30.0000 mg | ORAL_TABLET | Freq: Every day | ORAL | Status: DC
  Administered 2013-06-16 – 2013-06-18 (×3): 30 mg via ORAL
  Filled 2013-06-15 (×3): qty 1

## 2013-06-15 MED ORDER — METHOCARBAMOL 500 MG PO TABS: 500.0000 mg | ORAL_TABLET | Freq: Four times a day (QID) | ORAL | Status: DC | PRN

## 2013-06-15 MED ORDER — BENEFIBER PO POWD: Freq: Every morning | ORAL | Status: DC

## 2013-06-15 MED ORDER — SURGIFOAM 100 EX MISC
CUTANEOUS | Status: DC | PRN
  Administered 2013-06-15: 15:00:00 via TOPICAL

## 2013-06-15 MED ORDER — SODIUM CHLORIDE 0.9 % IR SOLN
Status: DC | PRN
  Administered 2013-06-15: 14:00:00

## 2013-06-15 MED ORDER — ALUM & MAG HYDROXIDE-SIMETH 200-200-20 MG/5ML PO SUSP: 30.0000 mL | Freq: Four times a day (QID) | ORAL | Status: DC | PRN

## 2013-06-15 MED ORDER — EPHEDRINE SULFATE 50 MG/ML IJ SOLN
INTRAMUSCULAR | Status: DC | PRN
  Administered 2013-06-15 (×3): 5 mg via INTRAVENOUS
  Administered 2013-06-15: 10 mg via INTRAVENOUS

## 2013-06-15 MED ORDER — PSYLLIUM 95 % PO PACK
1.0000 | PACK | Freq: Every day | ORAL | Status: DC
  Administered 2013-06-16 – 2013-06-17 (×2): 1 via ORAL
  Filled 2013-06-15 (×3): qty 1

## 2013-06-15 MED ORDER — SODIUM CHLORIDE 0.9 % IJ SOLN
3.0000 mL | Freq: Two times a day (BID) | INTRAMUSCULAR | Status: DC
  Administered 2013-06-16 – 2013-06-17 (×2): 3 mL via INTRAVENOUS

## 2013-06-15 MED ORDER — ATORVASTATIN CALCIUM 10 MG PO TABS
10.0000 mg | ORAL_TABLET | Freq: Every day | ORAL | Status: DC
  Administered 2013-06-16 – 2013-06-18 (×3): 10 mg via ORAL
  Filled 2013-06-15 (×3): qty 1

## 2013-06-15 MED ORDER — SALMETEROL XINAFOATE 50 MCG/DOSE IN AEPB
1.0000 | INHALATION_SPRAY | Freq: Two times a day (BID) | RESPIRATORY_TRACT | Status: DC
  Administered 2013-06-15 – 2013-06-18 (×6): 1 via RESPIRATORY_TRACT
  Filled 2013-06-15: qty 0

## 2013-06-15 SURGICAL SUPPLY — 78 items
ADH SKN CLS APL DERMABOND .7 (GAUZE/BANDAGES/DRESSINGS)
ADH SKN CLS LQ APL DERMABOND (GAUZE/BANDAGES/DRESSINGS) ×1
BAG DECANTER FOR FLEXI CONT (MISCELLANEOUS) ×2 IMPLANT
BLADE SURG ROTATE 9660 (MISCELLANEOUS) ×1 IMPLANT
BUR MATCHSTICK NEURO 3.0 LAGG (BURR) ×2 IMPLANT
CAGE COROENT MP 8X9X23M-8 SPIN (Cage) ×2 IMPLANT
CANISTER SUCT 3000ML (MISCELLANEOUS) ×2 IMPLANT
CONT SPEC 4OZ CLIKSEAL STRL BL (MISCELLANEOUS) ×4 IMPLANT
COVER BACK TABLE 24X17X13 BIG (DRAPES) IMPLANT
COVER TABLE BACK 60X90 (DRAPES) ×2 IMPLANT
DECANTER SPIKE VIAL GLASS SM (MISCELLANEOUS) ×2 IMPLANT
DERMABOND ADHESIVE PROPEN (GAUZE/BANDAGES/DRESSINGS) ×1
DERMABOND ADVANCED (GAUZE/BANDAGES/DRESSINGS)
DERMABOND ADVANCED .7 DNX12 (GAUZE/BANDAGES/DRESSINGS) ×1 IMPLANT
DERMABOND ADVANCED .7 DNX6 (GAUZE/BANDAGES/DRESSINGS) IMPLANT
DRAPE C-ARM 42X72 X-RAY (DRAPES) ×2 IMPLANT
DRAPE LAPAROTOMY 100X72X124 (DRAPES) ×2 IMPLANT
DRAPE POUCH INSTRU U-SHP 10X18 (DRAPES) ×2 IMPLANT
DRAPE PROXIMA HALF (DRAPES) ×2 IMPLANT
DRSG OPSITE POSTOP 4X10 (GAUZE/BANDAGES/DRESSINGS) ×1 IMPLANT
DURAPREP 26ML APPLICATOR (WOUND CARE) ×2 IMPLANT
ELECT REM PT RETURN 9FT ADLT (ELECTROSURGICAL) ×2
ELECTRODE REM PT RTRN 9FT ADLT (ELECTROSURGICAL) ×1 IMPLANT
GAUZE SPONGE 4X4 16PLY XRAY LF (GAUZE/BANDAGES/DRESSINGS) IMPLANT
GLOVE BIO SURGEON STRL SZ 6.5 (GLOVE) ×5 IMPLANT
GLOVE BIOGEL PI IND STRL 6.5 (GLOVE) IMPLANT
GLOVE BIOGEL PI IND STRL 7.5 (GLOVE) IMPLANT
GLOVE BIOGEL PI IND STRL 8.5 (GLOVE) ×2 IMPLANT
GLOVE BIOGEL PI INDICATOR 6.5 (GLOVE) ×1
GLOVE BIOGEL PI INDICATOR 7.5 (GLOVE) ×1
GLOVE BIOGEL PI INDICATOR 8.5 (GLOVE) ×3
GLOVE ECLIPSE 7.5 STRL STRAW (GLOVE) ×1 IMPLANT
GLOVE ECLIPSE 8.5 STRL (GLOVE) ×5 IMPLANT
GLOVE EXAM NITRILE LRG STRL (GLOVE) IMPLANT
GLOVE EXAM NITRILE MD LF STRL (GLOVE) IMPLANT
GLOVE EXAM NITRILE XL STR (GLOVE) IMPLANT
GLOVE EXAM NITRILE XS STR PU (GLOVE) IMPLANT
GLOVE SURG SS PI 7.0 STRL IVOR (GLOVE) ×3 IMPLANT
GOWN BRE IMP SLV AUR LG STRL (GOWN DISPOSABLE) IMPLANT
GOWN BRE IMP SLV AUR XL STRL (GOWN DISPOSABLE) IMPLANT
GOWN STRL REIN 2XL LVL4 (GOWN DISPOSABLE) ×2 IMPLANT
GOWN STRL REUS W/ TWL LRG LVL3 (GOWN DISPOSABLE) IMPLANT
GOWN STRL REUS W/TWL 2XL LVL3 (GOWN DISPOSABLE) ×3 IMPLANT
GOWN STRL REUS W/TWL LRG LVL3 (GOWN DISPOSABLE) ×8
HEMOSTAT POWDER KIT SURGIFOAM (HEMOSTASIS) IMPLANT
KIT BASIN OR (CUSTOM PROCEDURE TRAY) ×2 IMPLANT
KIT ROOM TURNOVER OR (KITS) ×2 IMPLANT
MILL MEDIUM DISP (BLADE) ×1 IMPLANT
NDL SPNL 18GX3.5 QUINCKE PK (NEEDLE) IMPLANT
NEEDLE HYPO 22GX1.5 SAFETY (NEEDLE) ×2 IMPLANT
NEEDLE SPNL 18GX3.5 QUINCKE PK (NEEDLE) IMPLANT
NS IRRIG 1000ML POUR BTL (IV SOLUTION) ×2 IMPLANT
PACK FOAM VITOSS 10CC (Orthopedic Implant) ×1 IMPLANT
PACK LAMINECTOMY NEURO (CUSTOM PROCEDURE TRAY) ×2 IMPLANT
PAD ARMBOARD 7.5X6 YLW CONV (MISCELLANEOUS) ×6 IMPLANT
PATTIES SURGICAL .5 X1 (DISPOSABLE) ×1 IMPLANT
ROD 200MM (Rod) ×4 IMPLANT
ROD SPNL 200X5.5XNS LF TI (Rod) IMPLANT
SCREW LOCK (Screw) ×4 IMPLANT
SCREW LOCK 100X5.5X OPN (Screw) IMPLANT
SCREW POLYAXIAL ARMT15T 6.5X55 (Screw) ×2 IMPLANT
SPONGE GAUZE 4X4 12PLY (GAUZE/BANDAGES/DRESSINGS) ×2 IMPLANT
SPONGE LAP 4X18 X RAY DECT (DISPOSABLE) IMPLANT
SPONGE SURGIFOAM ABS GEL 100 (HEMOSTASIS) ×2 IMPLANT
SUT VIC AB 1 CT1 18XBRD ANBCTR (SUTURE) ×1 IMPLANT
SUT VIC AB 1 CT1 8-18 (SUTURE) ×4
SUT VIC AB 2-0 CP2 18 (SUTURE) ×3 IMPLANT
SUT VIC AB 3-0 SH 8-18 (SUTURE) ×4 IMPLANT
SYR 20ML ECCENTRIC (SYRINGE) ×2 IMPLANT
SYR 3ML LL SCALE MARK (SYRINGE) ×8 IMPLANT
SYR 5ML LL (SYRINGE) IMPLANT
SYR CONTROL 10ML LL (SYRINGE) ×1 IMPLANT
TOWEL OR 17X24 6PK STRL BLUE (TOWEL DISPOSABLE) ×2 IMPLANT
TOWEL OR 17X26 10 PK STRL BLUE (TOWEL DISPOSABLE) ×2 IMPLANT
TRAP SPECIMEN MUCOUS 40CC (MISCELLANEOUS) ×2 IMPLANT
TRAY FOLEY CATH 14FRSI W/METER (CATHETERS) ×1 IMPLANT
TRAY FOLEY CATH 16FRSI W/METER (SET/KITS/TRAYS/PACK) ×1 IMPLANT
WATER STERILE IRR 1000ML POUR (IV SOLUTION) ×2 IMPLANT

## 2013-06-15 NOTE — Anesthesia Preprocedure Evaluation (Signed)
Anesthesia Evaluation  Patient identified by MRN, date of birth, ID band Patient awake    Reviewed: Allergy & Precautions, H&P , NPO status , Patient's Chart, lab work & pertinent test results, reviewed documented beta blocker date and time   Airway Mallampati: II TM Distance: >3 FB Neck ROM: full    Dental   Pulmonary shortness of breath and with exertion, asthma , pneumonia -, resolved, COPD COPD inhaler,  breath sounds clear to auscultation        Cardiovascular negative cardio ROS  + dysrhythmias Atrial Fibrillation Rhythm:regular     Neuro/Psych negative neurological ROS  negative psych ROS   GI/Hepatic Neg liver ROS, hiatal hernia, GERD-  Medicated and Controlled,  Endo/Other  negative endocrine ROS  Renal/GU negative Renal ROS  negative genitourinary   Musculoskeletal   Abdominal   Peds  Hematology negative hematology ROS (+)   Anesthesia Other Findings See surgeon's H&P   Reproductive/Obstetrics negative OB ROS                           Anesthesia Physical Anesthesia Plan  ASA: III  Anesthesia Plan: General   Post-op Pain Management:    Induction: Intravenous  Airway Management Planned: Oral ETT  Additional Equipment:   Intra-op Plan:   Post-operative Plan: Extubation in OR  Informed Consent: I have reviewed the patients History and Physical, chart, labs and discussed the procedure including the risks, benefits and alternatives for the proposed anesthesia with the patient or authorized representative who has indicated his/her understanding and acceptance.   Dental Advisory Given  Plan Discussed with: CRNA and Surgeon  Anesthesia Plan Comments:         Anesthesia Quick Evaluation

## 2013-06-15 NOTE — Op Note (Signed)
Date of surgery: 06/15/2013 Preoperative diagnosis: Pseudoarthrosis L4-L5 spondylosis L5-S1 with L5 and S1 radiculopathy left greater than right Postoperative diagnosis: Pseudoarthrosis L4-5, spondylosis L5-S1 with L5 and S1 radiculopathy left greater than right. Procedure: Revision of posterior arthrodesis L4-5 and decompression of L5 and S1 nerve roots the a posterior lumbar interbody arthrodesis L5-S1 with more work than require for simple fusion. Pedicle fixation from L1-S1 using segmental instrumentation posterior lateral arthrodesis L4-5 and L5-S1.  Surgeon: [name] First Asst.: Hazle Coca Anesthesia: Gen. endotracheal Indications: Levi [name] is a 68 year old individual is had significant problems with back and left lower extremity pain several years ago he underwent decompression fusion from L1-L5. He appears that he has a pseudoarthrosis at L4-L5. He has evidence of left-sided radiculopathy and L5 and S1 distribution worse on the left than on the right. Been advised regarding surgical revision.  Procedure: The patient was brought to the operating room supine on a stretcher. After the smooth induction of general endotracheal anesthesia he was turned prone. The back was prepped with alcohol and DuraPrep and draped in a sterile fashion. A midline incision was created in the lower lumbar spine and this was carried down to the lumbar dorsal fascia. Dissection was carried out laterally to expose the old hardware. Dissection was carried inferiorly to expose then the L5 and superiorly to expose the L1 screw. The screw caps were then exposed and loosened. The rods were individually removed. As noted on the right side of the rod was fractured just below the L4 pedicle screw. On the left side the L5 screws no to be loose within its hole. Examining for motion no gross motion could be instilled at any level including L4-L5 with a pseudoarthrosis was noted L5-S1 also exhibited very little motion.  Nonetheless because it is planned to decompress the L5 and S1 nerve roots at L5 S1-1 laminotomy was created on both sides L5 out to and including the medial facetectomy that expose the common dural tube the S1 nerve root inferiorly and the L5 nerve root superiorly. Decompression of the L5 nerve root into the foramen was undertaken removing sig and a thickened redundant yellow ligament was overgrown bone from the superior articular process of S1. A SLAP for good decompression particularly on the left side where there was substantial stenosis. On the right side similar stenosis was noted but not as severe. A discectomy was then performed at L5-S1 opening the disc space and removing a significant quantity of severely degenerated and desiccated disc material the disc space was noted be tight distracting the disc space was difficult however were able to expand this to an 8 mm size. Ultimately 8 mm x 23 mm long peek spacers were placed into the interspace to maintain some distraction which also afforded or L5 nerve root decompression. Autograft was packed into the interspace also. At this point the reference arc 4 the Stealth navigation system was placed on the spinous process of the sacrum. A spin was performed with the arm. Using this ritual image is able to place 26.5 x 55 mm pedicle screws into the sacrum. Bicortical purchase was obtained. The L5 screws were removed bilaterally. This was because the projection of the L5 screws but it into the S1 screws. The lateral gutters were decorticated from the sacrum through L5 and L4. Autograft with the remaining the cost bone sponge was placed into the lateral gutters bilaterally. New rods were contoured from nuvasive and these were placed in the saddles of the screws from L1-S1.  Screw caps were reapplied from L1-L4 with the Alphatec screw caps that we had used previously and the S1 screws is nuvasive screw caps. These were torqued into final position. A postoperative spin was  performed to verify good position of the hardware. Care was taken to protect and make sure that the L5 and S1 nerve roots were well decompressed. When this was verified the remnants of the bone graft were packed into the lateral gutters. Hemostasis was checked. Lumbar dorsal fascia was then closed with 2-0 Vicryl in interrupted fashion 2-0 Vicryl was used in the subcutaneous tissues and 3-0 Vicryl was used to close the subcutaneous, skin. Blood loss was estimated at 500 cc. Patient tolerated procedure well.

## 2013-06-15 NOTE — Transfer of Care (Signed)
Immediate Anesthesia Transfer of Care Note  Patient: Levi [name]  Procedure(s) Performed: Procedure(s) with comments: Posterior lumbar interbody fusion with revision of Lumbar four-five and removal of rods Lumbar one-Lumbar five with Stealth Navigation and O arm (N/A) - Lumbar four-five, Lumbar five-Sacral one Posterior lumbar interbody fusion with revision of Lumbar four-five and removal of rods Lumbar one-Lumbar five with Stealth Navigation and O arm  Patient Location: PACU  Anesthesia Type:General  Level of Consciousness: lethargic and responds to stimulation  Airway & Oxygen Therapy: Patient Spontanous Breathing and Patient connected to nasal cannula oxygen  Post-op Assessment: Report given to PACU RN  Post vital signs: Reviewed and stable  Complications: No apparent anesthesia complications

## 2013-06-15 NOTE — Anesthesia Procedure Notes (Signed)
Procedure Name: Intubation Date/Time: 06/15/2013 1:26 PM Performed by: Carola Frost Pre-anesthesia Checklist: Emergency Drugs available, Timeout performed, Patient identified, Suction available and Patient being monitored Patient Re-evaluated:Patient Re-evaluated prior to inductionOxygen Delivery Method: Circle system utilized Preoxygenation: Pre-oxygenation with 100% oxygen Intubation Type: IV induction Ventilation: Mask ventilation without difficulty and Oral airway inserted - appropriate to patient size Laryngoscope Size: Mac and 3 Grade View: Grade III Tube type: Oral Tube size: 8.0 mm Number of attempts: 1 Airway Equipment and Method: Stylet Placement Confirmation: positive ETCO2,  ETT inserted through vocal cords under direct vision and breath sounds checked- equal and bilateral Secured at: 24 cm Tube secured with: Tape Dental Injury: Teeth and Oropharynx as per pre-operative assessment

## 2013-06-15 NOTE — Progress Notes (Signed)
Patient ID: Levi [name], male   DOB: 23-Jan-1946, 68 y.o.   MRN: [id number] Vital signs stable. Motor function appears intact in lower extremities Dressing is dry and intact. Stable postop

## 2013-06-15 NOTE — Preoperative (Signed)
Beta Blockers   Reason not to administer Beta Blockers:Not Applicable 

## 2013-06-15 NOTE — H&P (Signed)
Levi [name] is an 68 y.o. male.   Chief Complaint: Back bilateral buttock pain weakness in caps. HPI: Levi [name] is a 68 year old individual who several years ago underwent decompression and fusion from L1-L5 secondary to a degenerative scoliosis with stenosis. He underwent an anterolateral decompression and fusion with percutaneous pedicle screw fixation. He appears to form the solid arthrodesis at every level except L4-L5. Patient recently presented with increasing back pain bilateral calf discomfort. He was found to have evidence of a pseudoarthrosis at L4-L5 and worsening spondylitic stenosis at L5-S1. Having failed efforts at conservative management, he was advised regarding surgical intervention to decompress and stabilize L5-S1 and to revise fixation fusion at L4-L5.  Past Medical History  Diagnosis Date  . Asthma   . ALLERGIC RHINITIS   . COPD (chronic obstructive pulmonary disease)   . Hyperlipidemia     takes Lipitor daily  . Agent orange exposure 1970's    takes Imdur,Diltiazem, and Atacand daily  . Joint pain   . Joint swelling   . Elevated uric acid in blood     takes Allopurinol daily  . Bruises easily     d/t prednisone daily  . GERD (gastroesophageal reflux disease)     takes Protonix daily  . Diverticulitis   . Enlarged prostate     but not on any meds  . Pneumonia ~ 2010    "once" (04/28/2012)  . Chronic bronchitis     "used to get it q yr; last time ?2008" (04/28/2012)  . H/O hiatal hernia   . Degenerative disk disease     "qwhere" (04/28/2012)  . Personal history of colonic adenomas 02/09/2013  . Atrial fibrillation   . Shortness of breath     lying/sitting/standing    Past Surgical History  Procedure Laterality Date  . Knee surgery    . Gallbladder surgery    . Total hip arthroplasty  2011    "left" (04/28/2012)  . Lateral / posterior combined fusion lumbar spine  2012  . Joint replacement    . Total knee arthroplasty  01/10/2012    Procedure: TOTAL  KNEE ARTHROPLASTY;  Surgeon: [name], MD;  Location: [facility];  Service: Orthopedics;;  left total knee arthroplasty  . Cardiac catheterization  11/2008    [name] - 20% calcified non flow limiting left main, 50% EF apical hypokinesis  . Knee arthroscopy      "had them both scoped a bunch of times" (12/07/27/2011)  . Colonoscopy    . Esophagogastroduodenoscopy    . Reverse shoulder arthroplasty  04/28/2012    left  . Tonsillectomy and adenoidectomy  1954  . Cholecystectomy  1990's  . Reverse shoulder arthroplasty  04/28/2012    Procedure: REVERSE SHOULDER ARTHROPLASTY;  Surgeon: [name], MD;  Location: [facility];  Service: Orthopedics;  Laterality: Left;  Left shouder reverse total shoulder arthroplasty  . Shoulder surgery    . Anterior cervical decomp/discectomy fusion N/A 07/21/2012    Procedure: ANTERIOR CERVICAL DECOMPRESSION/DISCECTOMY FUSION 2 LEVELS;  Surgeon: [name], MD;  Location: [facility] [facility];  Service: Neurosurgery;  Laterality: N/A;  C4-5 C5-6 Anterior cervical decompression/diskectomy/fusion  . Eye surgery  2011    "bilaterally; steroidal encapsulation" (04/28/2012)    Family History  Problem Relation Age of Onset  . Heart disease Father     Died 74, MI  . Prostate cancer Brother    Social History:  reports that he has never smoked. He has never used smokeless tobacco. He  reports that he does not drink alcohol or use illicit drugs.  Allergies:  Allergies  Allergen Reactions  . Morphine And Related Other (See Comments)    Light headed and sweaty; "my heart races" (04/28/2012)  . Levaquin [Levofloxacin] Itching    Medications Prior to Admission  Medication Sig Dispense Refill  . albuterol (PROAIR HFA) 108 (90 BASE) MCG/ACT inhaler Inhale 2 puffs into the lungs every 4 (four) hours as needed for wheezing.  3 Inhaler  3  . allopurinol (ZYLOPRIM) 100 MG tablet Take 100 mg by mouth daily.        [name] atorvastatin (LIPITOR) 10 MG tablet Take 10 mg by mouth  daily.       . beclomethasone (QVAR) 80 MCG/ACT inhaler Inhale 1 puff into the lungs 2 (two) times daily.      . candesartan (ATACAND) 4 MG tablet Take 2 mg by mouth daily.       . cyclobenzaprine (FLEXERIL) 10 MG tablet Take 10 mg by mouth 3 (three) times daily as needed for muscle spasms.       [name] diltiazem (CARDIZEM CD) 180 MG 24 hr capsule Take 180 mg by mouth daily.      . formoterol (FORADIL AEROLIZER) 12 MCG capsule for inhaler Place 12 mcg into inhaler and inhale every 12 (twelve) hours.      . isosorbide mononitrate (IMDUR) 30 MG 24 hr tablet Take 30 mg by mouth daily.       . pantoprazole (PROTONIX) 40 MG tablet Take 40 mg by mouth 2 (two) times daily.      . predniSONE (DELTASONE) 10 MG tablet Take 1 tablet (10 mg total) by mouth daily.  90 tablet  3  . Wheat Dextrin (BENEFIBER PO) Take 1 application by mouth daily as needed (for constipation). 1-3 tablespoons daily        No results found for this or any previous visit (from the past 48 hour(s)). No results found.  Review of Systems  HENT: Negative.   Eyes: Negative.   Respiratory: Negative.   Cardiovascular: Negative.   Gastrointestinal: Negative.   Genitourinary: Negative.   Musculoskeletal: Positive for back pain.  Skin: Negative.   Neurological: Positive for weakness.       Weakness in both lower extremities. Mostly distally in caps.  Endo/Heme/Allergies: Negative.   Psychiatric/Behavioral: Negative.     Blood pressure 137/71, pulse 56, temperature 97.8 F (36.6 C), temperature source Oral, resp. rate 16, SpO2 100.00%. Physical Exam  Constitutional: He appears well-developed and well-nourished.  HENT:  Head: Normocephalic and atraumatic.  Eyes: Conjunctivae and EOM are normal. Pupils are equal, round, and reactive to light.  Neck: Normal range of motion. Neck supple.  Cardiovascular: Normal rate and regular rhythm.   Respiratory: Effort normal and breath sounds normal.  GI: Soft. Bowel sounds are normal.   Musculoskeletal:  Tender across lumbosacral junction. Flexion to 45.  Neurological:  Motor function intact in iliopsoas quadriceps tibialis anterior however gastrocs reveal 4-5 weakness with difficulty walking at the toes. Absent Achilles reflexes bilateral. Trace patellar reflexes bilaterally. Upper extremity reflexes are normal sensation appears normal in all distal extremities. Cranial nerve examination is normal.  Skin: Skin is warm and dry.  Psychiatric: He has a normal mood and affect. His behavior is normal. Judgment and thought content normal.     Assessment/Plan Pseudoarthrosis L4-L5, spondylosis and stenosis L5-S1.  Plan and decompress and stabilize L4 to the sacrum. Likely removal of previously placed hardware L1-L4.  Darlinda Bellows J 06/15/2013,  12:54 PM

## 2013-06-15 NOTE — Progress Notes (Signed)
Pt states numb and tingling to LE's is better that is was before surgery

## 2013-06-16 LAB — CBC
HCT: 33.3 % — ABNORMAL LOW (ref 39.0–52.0)
Hemoglobin: 11.3 g/dL — ABNORMAL LOW (ref 13.0–17.0)
MCH: 31.2 pg (ref 26.0–34.0)
MCHC: 33.9 g/dL (ref 30.0–36.0)
MCV: 92 fL (ref 78.0–100.0)
Platelets: 148 10*3/uL — ABNORMAL LOW (ref 150–400)
RBC: 3.62 MIL/uL — ABNORMAL LOW (ref 4.22–5.81)
RDW: 15.3 % (ref 11.5–15.5)
WBC: 11.3 10*3/uL — ABNORMAL HIGH (ref 4.0–10.5)

## 2013-06-16 LAB — BASIC METABOLIC PANEL
BUN: 14 mg/dL (ref 6–23)
CO2: 25 mEq/L (ref 19–32)
Calcium: 8.7 mg/dL (ref 8.4–10.5)
Chloride: 102 mEq/L (ref 96–112)
Creatinine, Ser: 0.62 mg/dL (ref 0.50–1.35)
GFR calc Af Amer: >90 mL/min (ref >90)
GFR calc non Af Amer: >90 mL/min (ref >90)
Glucose, Bld: 126 mg/dL — ABNORMAL HIGH (ref 70–99)
Potassium: 3.9 mEq/L (ref 3.7–5.3)
Sodium: 138 mEq/L (ref 137–147)

## 2013-06-16 NOTE — Anesthesia Postprocedure Evaluation (Signed)
  Anesthesia Post-op Note  Patient: Levi [name]  Procedure(s) Performed: Procedure(s) with comments: Posterior lumbar interbody fusion with revision of Lumbar four-five and removal of rods Lumbar one-Lumbar five with Stealth Navigation and O arm (N/A) - Lumbar four-five, Lumbar five-Sacral one Posterior lumbar interbody fusion with revision of Lumbar four-five and removal of rods Lumbar one-Lumbar five with Stealth Navigation and O arm  Patient Location: PACU  Anesthesia Type:General  Level of Consciousness: awake, alert  and oriented  Airway and Oxygen Therapy: Patient Spontanous Breathing and Patient connected to nasal cannula oxygen  Post-op Pain: mild  Post-op Assessment: Post-op Vital signs reviewed, Patient's Cardiovascular Status Stable, Respiratory Function Stable, Patent Airway and Pain level controlled  Post-op Vital Signs: stable  Complications: No apparent anesthesia complications

## 2013-06-16 NOTE — Progress Notes (Signed)
UR completed 

## 2013-06-16 NOTE — Evaluation (Signed)
Occupational Therapy Evaluation Patient Details Name: Levi [name] MRN: [id number] DOB: 1946/01/28 Today's Date: 06/16/2013 Time: [id number] OT Time Calculation (min): 20 min  OT Assessment / Plan / Recommendation History of present illness 68 y.o. s/p Posterior lumbar interbody fusion with revision of Lumbar four-five and removal of rods Lumbar one-Lumbar five with Stealth Navigation and O arm (N/A) - Lumbar four-five, Lumbar five-Sacral one Posterior lumbar interbody fusion with revision of Lumbar four-five and removal of rods Lumbar one-Lumbar five with Stealth Navigation and O arm   Clinical Impression   Pt presents with below problem list. Pt independent with ADLs, PTA. Feel pt will benefit from acute OT to increase independence prior to d/c.    OT Assessment  Patient needs continued OT Services    Follow Up Recommendations  No OT follow up;Supervision - Intermittent (supervision when OOB/mobility)    Barriers to Discharge      Equipment Recommendations  None recommended by OT    Recommendations for Other Services    Frequency  Min 2X/week    Precautions / Restrictions Precautions Precautions: Back Precaution Booklet Issued: No Precaution Comments: Reviewed precautions with pt Required Braces or Orthoses: Spinal Brace Spinal Brace: Lumbar corset;Applied in sitting position Restrictions Weight Bearing Restrictions: No   Pertinent Vitals/Pain Pain 5/10. Nurse notified. Repositioned.     ADL  Grooming: Set up;Supervision/safety Where Assessed - Grooming: Supported sitting Upper Body Dressing: Set up;Supervision/safety (back brace) Where Assessed - Upper Body Dressing: Unsupported sitting Lower Body Dressing: Min guard Where Assessed - Lower Body Dressing: Supported sit to stand Toilet Transfer: Min Psychiatric nurse Method: Sit to Loss adjuster, chartered: Raised toilet seat with arms (or 3-in-1 over toilet) Tub/Shower Transfer Method: Not  assessed Equipment Used: Gait belt;Back brace;Reacher;Long-handled sponge;Rolling walker;Sock aid Transfers/Ambulation Related to ADLs: Min guard  ADL Comments: Educated on use of two cups for teeth care and having items on right side of sink to avoid breaking precautions. Discussed use of bag on walker. Practiced donning/doffing socks with reacher/sockaid. Educated on dressing technique.    OT Diagnosis: Acute pain  OT Problem List: Decreased strength;Decreased knowledge of use of DME or AE;Decreased knowledge of precautions;Pain;Impaired balance (sitting and/or standing);Decreased range of motion;Impaired UE functional use OT Treatment Interventions: Self-care/ADL training;DME and/or AE instruction;Therapeutic activities;Patient/family education;Balance training   OT Goals(Current goals can be found in the care plan section) Acute Rehab OT Goals Patient Stated Goal: go home OT Goal Formulation: With patient Time For Goal Achievement: 06/23/13 Potential to Achieve Goals: Good ADL Goals Pt Will Perform Grooming: with modified independence;standing Pt Will Transfer to Toilet: with modified independence;ambulating (3 in 1 over commode) Pt Will Perform Toileting - Clothing Manipulation and hygiene: with modified independence;sit to/from stand Pt Will Perform Tub/Shower Transfer: Shower transfer;with supervision;rolling walker;ambulating;shower seat Additional ADL Goal #1: Pt will independently verbalize and demonstrate 3/3 back precautions.   Visit Information  Last OT Received On: 06/16/13 Assistance Needed: +1 History of Present Illness: 68 y.o. s/p Posterior lumbar interbody fusion with revision of Lumbar four-five and removal of rods Lumbar one-Lumbar five with Stealth Navigation and O arm (N/A) - Lumbar four-five, Lumbar five-Sacral one Posterior lumbar interbody fusion with revision of Lumbar four-five and removal of rods Lumbar one-Lumbar five with Stealth Navigation and O arm        Prior Functioning     Home Living Family/patient expects to be discharged to:: Private residence Living Arrangements: Spouse/significant other Available Help at Discharge: Family;Available 24 hours/day Type of Home: [place]  Layout: Multi-level;Able to live on main level with bedroom/bathroom Home Equipment: Shower seat - built in;Cane - single point;Crutches;Walker - 2 wheels;Adaptive equipment;Grab bars - tub/shower;Grab bars - toilet;Bedside commode Adaptive Equipment: Reacher;Sock aid;Long-handled shoe horn;Long-handled sponge Prior Function Level of Independence: Independent Communication Communication: No difficulties Dominant Hand: Right         Vision/Perception     Cognition  Cognition Arousal/Alertness: Awake/alert Behavior During Therapy: WFL for tasks assessed/performed Overall Cognitive Status: Within Functional Limits for tasks assessed    Extremity/Trunk Assessment Upper Extremity Assessment Upper Extremity Assessment: LUE deficits/detail LUE Deficits / Details: previous shoulder surgery; less than 90 degrees AROM shoulder flexion     Mobility Bed Mobility Overal bed mobility: Needs Assistance Bed Mobility: Rolling;Sidelying to Sit;Sit to Sidelying Rolling: Min guard;Supervision Sidelying to sit: Min guard Sit to sidelying: Min guard General bed mobility comments: Cues for log rolling technique. Transfers Overall transfer level: Needs assistance Equipment used: Rolling walker (2 wheeled) Transfers: Sit to/from Stand Sit to Stand: Min guard General transfer comment: Cues for technique.     Exercise     Balance     End of Session OT - End of Session Equipment Utilized During Treatment: Gait belt;Rolling walker;Back brace Activity Tolerance: Patient tolerated treatment well Patient left: in bed;with call bell/phone within reach Nurse Communication: Mobility status;Other (comment) (pain level)  GO     Benito Mccreedy  OTR/L [phone] 06/16/2013, 10:12 AM

## 2013-06-16 NOTE — Evaluation (Signed)
Physical Therapy Evaluation Patient Details Name: Levi [name] MRN: [id number] DOB: July 18, 1945 Today's Date: 06/16/2013 Time: 7124-5809 PT Time Calculation (min): 25 min  PT Assessment / Plan / Recommendation History of Present Illness  68 y.o. s/p Posterior lumbar interbody fusion with revision of Lumbar four-five and removal of rods Lumbar one-Lumbar five with Stealth Navigation and O arm (N/A) - Lumbar four-five, Lumbar five-Sacral one Posterior lumbar interbody fusion with revision of Lumbar four-five and removal of rods Lumbar one-Lumbar five with Stealth Navigation and O arm  Clinical Impression  Pt very motivated and well versed in back precautions as this is his 3rd back surgery.  Will continue to follow.      PT Assessment  Patient needs continued PT services    Follow Up Recommendations  Home health PT;Supervision - Intermittent    Does the patient have the potential to tolerate intense rehabilitation      Barriers to Discharge        Equipment Recommendations  None recommended by PT    Recommendations for Other Services     Frequency Min 5X/week    Precautions / Restrictions Precautions Precautions: Back Precaution Booklet Issued: Yes (comment) Precaution Comments: Reviewed precautions with pt Required Braces or Orthoses: Spinal Brace Spinal Brace: Lumbar corset;Applied in sitting position Restrictions Weight Bearing Restrictions: No   Pertinent Vitals/Pain Pain around incision "Not as bad as it was!"      Mobility  Bed Mobility Overal bed mobility: Needs Assistance Bed Mobility: Rolling;Sidelying to Sit Rolling: Supervision Sidelying to sit: Supervision General bed mobility comments: cues for log roll technique.   Transfers Overall transfer level: Needs assistance Equipment used: Rolling walker (2 wheeled) Transfers: Sit to/from Stand Sit to Stand: Min guard General transfer comment: Cues for technique. Ambulation/Gait Ambulation/Gait assistance:  Min guard Ambulation Distance (Feet): 300 Feet Assistive device: Rolling walker (2 wheeled) Gait Pattern/deviations: Step-through pattern;Decreased stride length;Trunk flexed;Narrow base of support General Gait Details: cues for upright posture and positioning within RW.      Exercises     PT Diagnosis: Difficulty walking;Acute pain  PT Problem List: Decreased strength;Decreased activity tolerance;Decreased balance;Decreased mobility;Decreased knowledge of use of DME;Decreased knowledge of precautions;Pain PT Treatment Interventions: DME instruction;Gait training;Stair training;Functional mobility training;Therapeutic activities;Therapeutic exercise;Balance training;Neuromuscular re-education;Patient/family education     PT Goals(Current goals can be found in the care plan section) Acute Rehab PT Goals Patient Stated Goal: go home PT Goal Formulation: With patient Time For Goal Achievement: 06/23/13 Potential to Achieve Goals: Good  Visit Information  Last PT Received On: 06/16/13 Assistance Needed: +1 History of Present Illness: 68 y.o. s/p Posterior lumbar interbody fusion with revision of Lumbar four-five and removal of rods Lumbar one-Lumbar five with Stealth Navigation and O arm (N/A) - Lumbar four-five, Lumbar five-Sacral one Posterior lumbar interbody fusion with revision of Lumbar four-five and removal of rods Lumbar one-Lumbar five with Stealth Navigation and O arm       Prior [place] expects to be discharged to:: Private residence Living Arrangements: Spouse/significant other Available Help at Discharge: Family;Available 24 hours/day Type of Home: House Home Access: Stairs to enter [employer] of Steps: 5 Entrance Stairs-Rails: Right Home Layout: Multi-level;Able to live on main level with bedroom/bathroom Home Equipment: Shower seat - built in;Cane - single point;Crutches;Walker - 2 wheels;Adaptive equipment;Grab bars -  tub/shower;Grab bars - toilet;Bedside commode Adaptive Equipment: Reacher;Sock aid;Long-handled shoe horn;Long-handled sponge Prior Function Level of Independence: Independent Communication Communication: No difficulties Dominant Hand: Right    Cognition  Cognition Arousal/Alertness: Awake/alert Behavior During Therapy: WFL for tasks assessed/performed Overall Cognitive Status: Within Functional Limits for tasks assessed    Extremity/Trunk Assessment Upper Extremity Assessment Upper Extremity Assessment: Defer to OT evaluation Lower Extremity Assessment Lower Extremity Assessment: Generalized weakness   Balance Balance Overall balance assessment: Needs assistance Standing balance support: Bilateral upper extremity supported Standing balance-[name] Scale: Fair  End of Session PT - End of Session Equipment Utilized During Treatment: Gait belt;Back brace Activity Tolerance: Patient tolerated treatment well Patient left: in chair;with call bell/phone within reach Nurse Communication: Mobility status  GP     Catarina Hartshorn, [place] 06/16/2013, 3:04 PM

## 2013-06-17 MED FILL — Sodium Chloride IV Soln 0.9%: INTRAVENOUS | Qty: 1000 | Status: AC

## 2013-06-17 MED FILL — Heparin Sodium (Porcine) Inj 1000 Unit/ML: INTRAMUSCULAR | Qty: 30 | Status: AC

## 2013-06-17 NOTE — Progress Notes (Signed)
Physical Therapy Treatment Patient Details Name: LEGEND TUMMINELLO MRN: [id number] DOB: 1945/07/12 Today's Date: 06/17/2013 Time: 9924-2683 PT Time Calculation (min): 13 min  PT Assessment / Plan / Recommendation  History of Present Illness 68 y.o. s/p Posterior lumbar interbody fusion with revision of Lumbar four-five and removal of rods Lumbar one-Lumbar five with Stealth Navigation and O arm (N/A) - Lumbar four-five, Lumbar five-Sacral one Posterior lumbar interbody fusion with revision of Lumbar four-five and removal of rods Lumbar one-Lumbar five with Stealth Navigation and O arm   PT Comments   Pt moving very well.  Feel pt is ready for D/C from PT standpoint.    Follow Up Recommendations  Home health PT;Supervision - Intermittent     Does the patient have the potential to tolerate intense rehabilitation     Barriers to Discharge        Equipment Recommendations  None recommended by PT    Recommendations for Other Services    Frequency Min 5X/week   Progress towards PT Goals Progress towards PT goals: Progressing toward goals  Plan Current plan remains appropriate    Precautions / Restrictions Precautions Precautions: Back Precaution Booklet Issued: Yes (comment) Precaution Comments: Reviewed precautions with pt Required Braces or Orthoses: Spinal Brace Spinal Brace: Lumbar corset;Applied in sitting position Restrictions Weight Bearing Restrictions: No   Pertinent Vitals/Pain Indicates back is just a little "sore".      Mobility  Transfers Overall transfer level: Modified independent Equipment used: Rolling walker (2 wheeled) Ambulation/Gait Ambulation/Gait assistance: Modified independent (Device/Increase time) Ambulation Distance (Feet): 500 Feet Assistive device: Rolling walker (2 wheeled) Gait Pattern/deviations: Step-through pattern;Decreased stride length General Gait Details: pt demos good safety and use of RW.  Only cued for more upright posture.   Stairs:  Yes Stairs assistance: Min guard Stair Management: No rails;Step to pattern;Forwards Number of Stairs: 2 General stair comments: cues to slow down for safety.      Exercises     PT Diagnosis:    PT Problem List:   PT Treatment Interventions:     PT Goals (current goals can now be found in the care plan section) Acute Rehab PT Goals Patient Stated Goal: go home Time For Goal Achievement: 06/23/13 Potential to Achieve Goals: Good  Visit Information  Last PT Received On: 06/17/13 Assistance Needed: +1 History of Present Illness: 67 y.o. s/p Posterior lumbar interbody fusion with revision of Lumbar four-five and removal of rods Lumbar one-Lumbar five with Stealth Navigation and O arm (N/A) - Lumbar four-five, Lumbar five-Sacral one Posterior lumbar interbody fusion with revision of Lumbar four-five and removal of rods Lumbar one-Lumbar five with Stealth Navigation and O arm    Subjective Data  Patient Stated Goal: go home   Cognition  Cognition Arousal/Alertness: Awake/alert Behavior During Therapy: WFL for tasks assessed/performed Overall Cognitive Status: Within Functional Limits for tasks assessed    Balance     End of Session PT - End of Session Equipment Utilized During Treatment: Gait belt;Back brace Activity Tolerance: Patient tolerated treatment well Patient left: in chair;with call bell/phone within reach Nurse Communication: Mobility status   GP     OrograndeThornton Papas, [employer] 06/17/2013, 8:30 AM

## 2013-06-17 NOTE — Evaluation (Signed)
Occupational Therapy Evaluation Patient Details Name: Levi [name] MRN: [id number] DOB: 09/19/1945 Today's Date: 06/17/2013 Time: 5784-6962 OT Time Calculation (min): 17 min  OT Assessment / Plan / Recommendation History of present illness 68 y.o. s/p Posterior lumbar interbody fusion with revision of Lumbar four-five and removal of rods Lumbar one-Lumbar five with Stealth Navigation and O arm (N/A) - Lumbar four-five, Lumbar five-Sacral one Posterior lumbar interbody fusion with revision of Lumbar four-five and removal of rods Lumbar one-Lumbar five with Stealth Navigation and O arm   Clinical Impression   Pt is knowledgeable in back precautions and safety.  He has all necessary equipment at home and is knowledgeable in use .  Pt is ready for discharge home from an OT standpoint.  No further OT needs.    OT Assessment       Follow Up Recommendations  No OT follow up    Barriers to Discharge      Equipment Recommendations  None recommended by OT    Recommendations for Other Services    Frequency  Min 2X/week    Precautions / Restrictions Precautions Precautions: Back Precaution Comments: Reviewed precautions with pt in regard to ADL and IADL Required Braces or Orthoses: Spinal Brace Spinal Brace: Lumbar corset;Applied in sitting position   Pertinent Vitals/Pain No c/o pain, premedicated, VSS    ADL  Grooming: Denture care;Wash/dry hands;Modified independent (instructed to hinge at hips to avoid bending) Where Assessed - Grooming: Unsupported standing Toilet Transfer: Modified independent Toilet Transfer Method: Sit to stand Toilet Transfer Equipment: Raised toilet seat with arms (or 3-in-1 over toilet) Toileting - Clothing Manipulation and Hygiene: Modified independent Where Assessed - Toileting Clothing Manipulation and Hygiene: Sit to stand from 3-in-1 or toilet Tub/Shower Transfer: Modified independent Tub/Shower Transfer Method: Ambulating Equipment Used: Rolling  walker;Back brace Transfers/Ambulation Related to ADLs: mod I with RW ADL Comments: Pt was using AE for LB ADL at baseline. Generalized back precautions in ADL and ADL transfers.    OT Diagnosis:    OT Problem List:   OT Treatment Interventions:     OT Goals(Current goals can be found in the care plan section) Acute Rehab OT Goals Patient Stated Goal: go home  Visit Information  Last OT Received On: 06/17/13 History of Present Illness: 68 y.o. s/p Posterior lumbar interbody fusion with revision of Lumbar four-five and removal of rods Lumbar one-Lumbar five with Stealth Navigation and O arm (N/A) - Lumbar four-five, Lumbar five-Sacral one Posterior lumbar interbody fusion with revision of Lumbar four-five and removal of rods Lumbar one-Lumbar five with Stealth Navigation and O arm       Prior Functioning               Vision/Perception     Cognition  Cognition Arousal/Alertness: Awake/alert Behavior During Therapy: WFL for tasks assessed/performed Overall Cognitive Status: Within Functional Limits for tasks assessed    Extremity/Trunk Assessment       Mobility Transfers Equipment used: Rolling walker (2 wheeled) Transfers: Sit to/from Stand Sit to Stand: Modified independent (Device/Increase time)     Exercise     Balance     End of Session OT - End of Session Activity Tolerance: Patient tolerated treatment well Patient left: in chair;with call bell/phone within reach;with family/visitor present  GO     Malka So 06/17/2013, 1:09 PM [phone]

## 2013-06-17 NOTE — Progress Notes (Signed)
Subjective: Patient reports Back pain is tolerable. Leg on the left side particularly feels much better.  Objective: Vital signs in last 24 hours: Temp:  [97.6 F (36.4 C)-99.4 F (37.4 C)] 99.4 F (37.4 C) (01/22 0618) Pulse Rate:  [66-88] 75 (01/22 0618) Resp:  [16-18] 18 (01/22 0618) BP: (95-133)/(61-69) 133/66 mmHg (01/22 0618) SpO2:  [96 %-100 %] 100 % (01/22 0618)  Intake/Output from previous day: 01/21 0701 - 01/22 0700 In: -  Out: 475 [Urine:475] Intake/Output this shift:    Motor function appears intact in the major groups including the iliopsoas quadriceps tibialis anterior and gastrocs. Incision is clean and dry.  Lab Results:  Recent Labs  06/16/13 0426  WBC 11.3*  HGB 11.3*  HCT 33.3*  PLT 148*   BMET  Recent Labs  06/16/13 0426  NA 138  K 3.9  CL 102  CO2 25  GLUCOSE 126*  BUN 14  CREATININE 0.62  CALCIUM 8.7    Studies/Results: Dg Lumbar Spine 2-3 Views  06/15/2013   CLINICAL DATA:  L4-S1 fixation with revision.  EXAM: DG C-ARM 61-120 MIN; LUMBAR SPINE - 2-3 VIEW  COMPARISON:  CT L SPINE W/CM dated 05/21/2013  FINDINGS: Two AP and 2 lateral views. The first 2 images demonstrate prior fixation of multiple lumbar levels. The second 2 images demonstrate removal of the L5 screws. Fixation at L3, L4, and S1 remaining. No acute hardware complication.  IMPRESSION: Intraoperative imaging of lumbar spine revision.   Electronically Signed   By: Abigail Miyamoto M.D.   On: 06/15/2013 18:45   Dg C-arm 61-120 Min  06/15/2013   CLINICAL DATA:  L4-S1 fixation with revision.  EXAM: DG C-ARM 61-120 MIN; LUMBAR SPINE - 2-3 VIEW  COMPARISON:  CT L SPINE W/CM dated 05/21/2013  FINDINGS: Two AP and 2 lateral views. The first 2 images demonstrate prior fixation of multiple lumbar levels. The second 2 images demonstrate removal of the L5 screws. Fixation at L3, L4, and S1 remaining. No acute hardware complication.  IMPRESSION: Intraoperative imaging of lumbar spine revision.    Electronically Signed   By: Abigail Miyamoto M.D.   On: 06/15/2013 18:45    Assessment/Plan: Stable postop mild postoperative anemia secondary to acute blood loss. No need for transfusion as patient is asymptomatic.  LOS: 2 days  Mobilize, remove dressing, allow to shower, remove IV.   Lener Ventresca J 06/17/2013, 8:49 AM

## 2013-06-17 NOTE — Progress Notes (Signed)
Talked to patient about possible HHC at discharge; patient stated "I do not want it at this time'. CM will continue to follow for discharge planning; Aneta Mins [phone]

## 2013-06-17 NOTE — Clinical Social Work Note (Signed)
CSW received consult for possible SNF placement at time of discharge from [facility]. PT/OT are currently recommending pt to be discharged home with home health services. CSW signing off. Please re consult if discharge disposition changes.  Pati Gallo, [place] Social Worker [phone]

## 2013-06-18 MED ORDER — OXYCODONE-ACETAMINOPHEN 5-325 MG PO TABS: 1.0000 | ORAL_TABLET | ORAL | Status: DC | PRN

## 2013-06-18 MED ORDER — DIAZEPAM 5 MG PO TABS: 5.0000 mg | ORAL_TABLET | Freq: Four times a day (QID) | ORAL | Status: DC | PRN

## 2013-06-18 NOTE — Progress Notes (Signed)
Patient's wife called RN to observe a bleeding she thinks is on patient's dressing. RN assessed and saw a moderate stain on the gauze dressing. Dressing was changed and reassured patient is nothing to worry about. Therefore stained gauze has been saved for MD to see. Awaiting on the MD to be discharged.

## 2013-06-18 NOTE — Progress Notes (Signed)
Physical Therapy Treatment Patient Details Name: Levi [name] MRN: [id number] DOB: [birth year] Today's Date: 06/18/2013 Time: 0811-0822 PT Time Calculation (min): 11 min  PT Assessment / Plan / Recommendation  History of Present Illness 68 y.o. s/p Posterior lumbar interbody fusion with revision of Lumbar four-five and removal of rods Lumbar one-Lumbar five with Stealth Navigation and O arm (N/A) - Lumbar four-five, Lumbar five-Sacral one Posterior lumbar interbody fusion with revision of Lumbar four-five and removal of rods Lumbar one-Lumbar five with Stealth Navigation and O arm   PT Comments   Pt making great progress with mobility.  Ready for D/C from PT stand point.    Follow Up Recommendations  Supervision - Intermittent     Does the patient have the potential to tolerate intense rehabilitation     Barriers to Discharge        Equipment Recommendations  None recommended by PT    Recommendations for Other Services    Frequency Min 5X/week   Progress towards PT Goals Progress towards PT goals: Progressing toward goals  Plan Current plan remains appropriate    Precautions / Restrictions Precautions Precautions: Back Precaution Comments: Reviewed precautions with pt in regard to ADL and IADL Required Braces or Orthoses: Spinal Brace Spinal Brace: Lumbar corset;Applied in sitting position Restrictions Weight Bearing Restrictions: No   Pertinent Vitals/Pain "Not painful, it just feels tight and pulls".      Mobility  Transfers Overall transfer level: Modified independent Ambulation/Gait Ambulation/Gait assistance: Supervision Ambulation Distance (Feet): 600 Feet Assistive device: None Gait Pattern/deviations: Step-through pattern;Decreased stride length;Antalgic General Gait Details: Attempted gait without AD and pt antalgic on R LE 2/2 R knee pain.  pt indicates R knee in need of TKA.  pt demos good awareness of safety with ambulation.      Exercises     PT  Diagnosis:    PT Problem List:   PT Treatment Interventions:     PT Goals (current goals can now be found in the care plan section) Acute Rehab PT Goals Patient Stated Goal: go home Time For Goal Achievement: 06/23/13 Potential to Achieve Goals: Good  Visit Information  Last PT Received On: 06/18/13 Assistance Needed: +1 History of Present Illness: 68 y.o. s/p Posterior lumbar interbody fusion with revision of Lumbar four-five and removal of rods Lumbar one-Lumbar five with Stealth Navigation and O arm (N/A) - Lumbar four-five, Lumbar five-Sacral one Posterior lumbar interbody fusion with revision of Lumbar four-five and removal of rods Lumbar one-Lumbar five with Stealth Navigation and O arm    Subjective Data  Patient Stated Goal: go home   Cognition  Cognition Arousal/Alertness: Awake/alert Behavior During Therapy: WFL for tasks assessed/performed Overall Cognitive Status: Within Functional Limits for tasks assessed    Balance     End of Session PT - End of Session Equipment Utilized During Treatment: Gait belt;Back brace Activity Tolerance: Patient tolerated treatment well Patient left: in chair;with call bell/phone within reach Nurse Communication: Mobility status   GP     VermillionThornton Papas, [place] 06/18/2013, 8:26 AM

## 2013-06-18 NOTE — Discharge Summary (Signed)
Physician Discharge Summary  Patient ID: Levi [name] MRN: [id number] DOB/AGE: 1945/07/23 68 y.o.  Admit date: 06/15/2013 Discharge date: 06/18/2013  Admission Diagnoses: Pseudoarthrosis L4-5 status post arthrodesis L1-L5, spondylosis L5-S1 with lumbar radiculopathy  Discharge Diagnoses: Arthrosis L4-5, status post arthrodesis L1-L5, spondylosis L5-S1 with lumbar radiculopathy  Active Problems:   Pseudoarthrosis of lumbar spine   Discharged Condition: good  Hospital Course: She was admitted to undergo revision surgery to do supplemental arthrodesis at L4-L5 and also decompress and stabilize L5-S1. He underwent surgery which placed his rods from L1-S1 and posterior interbody arthrodesis at L5-S1. He tolerated the surgery well.  Consults: None  Significant Diagnostic Studies: None  Treatments: surgery: Surgery as noted above.  Discharge Exam: Blood pressure 115/60, pulse 82, temperature 97.8 F (36.6 C), temperature source Oral, resp. rate 20, height 6\' 2"  (1.88 m), weight 74.199 kg (163 lb 9.3 oz), SpO2 100.00%. Incision is clean and dry motor function is intact in both lower extremities.  Disposition: 01-Home or Self Care  Discharge Orders   Future Appointments Provider Department Dept Phone   07/09/2013 9:00 AM Kathee Delton, MD [facility] [phone]   10/19/2013 8:00 AM [facility] [employer] [facility] [phone]   12/28/2013 9:00 AM Deneise Lever, MD [facility] [phone]   Future Orders Complete By Expires   Call MD for:  redness, tenderness, or signs of infection (pain, swelling, redness, odor or green/yellow discharge around incision site)  As directed    Call MD for:  severe uncontrolled pain  As directed    Call MD for:  temperature >100.4  As directed    Diet - low sodium heart healthy  As directed    Discharge instructions  As directed    Comments:     Okay to shower. Do not apply salves or appointments to incision. No  heavy lifting with the upper extremities greater than 15 pounds. May resume driving when not requiring pain medication and patient feels comfortable with doing so.   Increase activity slowly  As directed        Medication List         albuterol 108 (90 BASE) MCG/ACT inhaler  Commonly known as:  PROAIR HFA  Inhale 2 puffs into the lungs every 4 (four) hours as needed for wheezing.     allopurinol 100 MG tablet  Commonly known as:  ZYLOPRIM  Take 100 mg by mouth daily.     atorvastatin 10 MG tablet  Commonly known as:  LIPITOR  Take 10 mg by mouth daily.     beclomethasone 80 MCG/ACT inhaler  Commonly known as:  QVAR  Inhale 1 puff into the lungs 2 (two) times daily.     BENEFIBER PO  Take 1 application by mouth daily as needed (for constipation). 1-3 tablespoons daily     candesartan 4 MG tablet  Commonly known as:  ATACAND  Take 2 mg by mouth daily.     cyclobenzaprine 10 MG tablet  Commonly known as:  FLEXERIL  Take 10 mg by mouth 3 (three) times daily as needed for muscle spasms.     diazepam 5 MG tablet  Commonly known as:  VALIUM  Take 1 tablet (5 mg total) by mouth every 6 (six) hours as needed for muscle spasms.     diltiazem 180 MG 24 hr capsule  Commonly known as:  CARDIZEM CD  Take 180 mg by mouth daily.     FORADIL AEROLIZER 12 MCG  capsule for inhaler  Generic drug:  formoterol  Place 12 mcg into inhaler and inhale every 12 (twelve) hours.     isosorbide mononitrate 30 MG 24 hr tablet  Commonly known as:  IMDUR  Take 30 mg by mouth daily.     oxyCODONE-acetaminophen 5-325 MG per tablet  Commonly known as:  PERCOCET/ROXICET  Take 1-2 tablets by mouth every 4 (four) hours as needed for moderate pain.     pantoprazole 40 MG tablet  Commonly known as:  PROTONIX  Take 40 mg by mouth 2 (two) times daily.     predniSONE 10 MG tablet  Commonly known as:  DELTASONE  Take 1 tablet (10 mg total) by mouth daily.         SignedEarleen Newport 06/18/2013, 7:12 PM

## 2013-06-18 NOTE — Progress Notes (Signed)
Discharge orders received, pt for discharge home today, IV D/C with dressing CDI to lower back.  D/C instructions and Rx given with verbalized understanding.  Family at bedside to assist pt with discharge. Staff brought pt downstairs via wheelchair.  

## 2013-06-21 ENCOUNTER — Ambulatory Visit (INDEPENDENT_AMBULATORY_CARE_PROVIDER_SITE_OTHER): Payer: Medicare Other

## 2013-06-21 DIAGNOSIS — J309 Allergic rhinitis, unspecified: Secondary | ICD-10-CM

## 2013-06-28 ENCOUNTER — Ambulatory Visit (INDEPENDENT_AMBULATORY_CARE_PROVIDER_SITE_OTHER): Payer: Medicare Other

## 2013-06-28 DIAGNOSIS — J309 Allergic rhinitis, unspecified: Secondary | ICD-10-CM | POA: Diagnosis not present

## 2013-06-30 ENCOUNTER — Encounter: Payer: Self-pay | Admitting: Internal Medicine

## 2013-07-05 ENCOUNTER — Ambulatory Visit (INDEPENDENT_AMBULATORY_CARE_PROVIDER_SITE_OTHER): Payer: Medicare Other

## 2013-07-05 DIAGNOSIS — J309 Allergic rhinitis, unspecified: Secondary | ICD-10-CM

## 2013-07-07 DIAGNOSIS — M545 Low back pain, unspecified: Secondary | ICD-10-CM | POA: Diagnosis not present

## 2013-07-07 DIAGNOSIS — IMO0002 Reserved for concepts with insufficient information to code with codable children: Secondary | ICD-10-CM | POA: Diagnosis not present

## 2013-07-09 ENCOUNTER — Ambulatory Visit: Payer: Medicare Other | Admitting: Pulmonary Disease

## 2013-07-12 ENCOUNTER — Ambulatory Visit (INDEPENDENT_AMBULATORY_CARE_PROVIDER_SITE_OTHER): Payer: Medicare Other

## 2013-07-12 DIAGNOSIS — J309 Allergic rhinitis, unspecified: Secondary | ICD-10-CM

## 2013-07-19 ENCOUNTER — Ambulatory Visit: Payer: Medicare Other

## 2013-07-20 ENCOUNTER — Ambulatory Visit (INDEPENDENT_AMBULATORY_CARE_PROVIDER_SITE_OTHER): Payer: Medicare Other | Admitting: Pulmonary Disease

## 2013-07-20 ENCOUNTER — Encounter: Payer: Self-pay | Admitting: Pulmonary Disease

## 2013-07-20 VITALS — BP 116/68 | HR 69 | Temp 97.5°F | Ht 74.0 in | Wt 162.2 lb

## 2013-07-20 DIAGNOSIS — J449 Chronic obstructive pulmonary disease, unspecified: Secondary | ICD-10-CM | POA: Diagnosis not present

## 2013-07-20 MED ORDER — BECLOMETHASONE DIPROPIONATE 80 MCG/ACT IN AERS: 2.0000 | INHALATION_SPRAY | Freq: Two times a day (BID) | RESPIRATORY_TRACT | Status: DC

## 2013-07-20 MED ORDER — ALBUTEROL SULFATE HFA 108 (90 BASE) MCG/ACT IN AERS: 2.0000 | INHALATION_SPRAY | RESPIRATORY_TRACT | Status: DC | PRN

## 2013-07-20 NOTE — Patient Instructions (Signed)
Continue on qvar, foradil, and albuterol as needed.  Will send in refills for your qvar and rescue inhaler. Try taking mucinex one in am and pm for next 5 days to see if helps with congestion. Try and decrease prednisone to 5mg  a day when able.  followup with me again in 31mos, but please call if you arer having increasing issues.

## 2013-07-20 NOTE — Progress Notes (Signed)
   Subjective:    Patient ID: Levi [name], male    DOB: 07-Jun-1945, 68 y.o.   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 The patient has done fairly well since the last visit, but last week he has had some increasing symptoms that is probably associated with the bad weather. He does not feel that he is having an acute exacerbation, and denies any significant chest congestion or purulent mucus. He does have some mild cough with minimal mucus currently.   Review of Systems  Constitutional: Negative for fever and unexpected weight change.  HENT: Negative for congestion, dental problem, ear pain, nosebleeds, postnasal drip, rhinorrhea, sinus pressure, sneezing, sore throat and trouble swallowing.   Eyes: Negative for redness and itching.  Respiratory: Positive for shortness of breath. Negative for cough, chest tightness and wheezing.   Cardiovascular: Negative for palpitations and leg swelling.  Gastrointestinal: Negative for nausea and vomiting.  Genitourinary: Negative for dysuria.  Musculoskeletal: Negative for joint swelling.  Skin: Negative for rash.  Neurological: Negative for headaches.  Hematological: Does not bruise/bleed easily.  Psychiatric/Behavioral: Negative for dysphoric mood. The patient is not nervous/anxious.        Objective:   Physical Exam Thin male in no acute distress Nose without purulence or discharge noted Neck without lymphadenopathy or thyromegaly Chest with clear breath sounds, no wheezing Cardiac exam with regular rate and rhythm Lower extremities without edema, no cyanosis Alert and oriented, moves all 4 extremities.       Assessment & Plan:

## 2013-07-20 NOTE — Assessment & Plan Note (Signed)
The patient overall is doing fairly well from an asthma standpoint, but I would like him to try and get his prednisone dose decreased as much as possible. He is having a little bit more cough and mucus currently, and I have asked him to start Mucinex to see if this will resolve. He is to call us if his breathing worsens, or if he begins to bring up purulent mucus.

## 2013-07-21 ENCOUNTER — Ambulatory Visit (INDEPENDENT_AMBULATORY_CARE_PROVIDER_SITE_OTHER): Payer: Medicare Other

## 2013-07-21 DIAGNOSIS — J309 Allergic rhinitis, unspecified: Secondary | ICD-10-CM | POA: Diagnosis not present

## 2013-07-26 ENCOUNTER — Ambulatory Visit (INDEPENDENT_AMBULATORY_CARE_PROVIDER_SITE_OTHER): Payer: Medicare Other

## 2013-07-26 DIAGNOSIS — J309 Allergic rhinitis, unspecified: Secondary | ICD-10-CM | POA: Diagnosis not present

## 2013-07-28 ENCOUNTER — Other Ambulatory Visit: Payer: Self-pay | Admitting: Pulmonary Disease

## 2013-07-29 ENCOUNTER — Other Ambulatory Visit: Payer: Self-pay | Admitting: Pulmonary Disease

## 2013-07-29 ENCOUNTER — Other Ambulatory Visit: Payer: Self-pay

## 2013-07-29 MED ORDER — CANDESARTAN CILEXETIL 4 MG PO TABS: 2.0000 mg | ORAL_TABLET | Freq: Every day | ORAL | Status: DC

## 2013-08-02 ENCOUNTER — Ambulatory Visit (INDEPENDENT_AMBULATORY_CARE_PROVIDER_SITE_OTHER): Payer: Medicare Other

## 2013-08-02 DIAGNOSIS — J309 Allergic rhinitis, unspecified: Secondary | ICD-10-CM | POA: Diagnosis not present

## 2013-08-09 ENCOUNTER — Ambulatory Visit: Payer: Medicare Other

## 2013-08-10 ENCOUNTER — Encounter: Payer: Self-pay | Admitting: Pulmonary Disease

## 2013-08-10 ENCOUNTER — Ambulatory Visit (INDEPENDENT_AMBULATORY_CARE_PROVIDER_SITE_OTHER): Payer: Medicare Other | Admitting: Pulmonary Disease

## 2013-08-10 ENCOUNTER — Ambulatory Visit (INDEPENDENT_AMBULATORY_CARE_PROVIDER_SITE_OTHER): Payer: Medicare Other

## 2013-08-10 VITALS — BP 112/68 | HR 98 | Temp 97.6°F | Ht 74.0 in | Wt 157.0 lb

## 2013-08-10 DIAGNOSIS — J309 Allergic rhinitis, unspecified: Secondary | ICD-10-CM

## 2013-08-10 DIAGNOSIS — J209 Acute bronchitis, unspecified: Secondary | ICD-10-CM | POA: Insufficient documentation

## 2013-08-10 DIAGNOSIS — J449 Chronic obstructive pulmonary disease, unspecified: Secondary | ICD-10-CM | POA: Diagnosis not present

## 2013-08-10 MED ORDER — AMOXICILLIN-POT CLAVULANATE 875-125 MG PO TABS: 1.0000 | ORAL_TABLET | Freq: Two times a day (BID) | ORAL | Status: DC

## 2013-08-10 NOTE — Assessment & Plan Note (Signed)
The patient is having increased cough with purulent mucus that he thinks is coming from his chest, but he is also having some sinus symptoms. Will treat with a round of antibiotics to see if things improve. The patient continues to have issues, we will need to check a chest x-ray for completeness.

## 2013-08-10 NOTE — Progress Notes (Signed)
   Subjective:    Patient ID: Levi [name], male    DOB: November 11, 1945, 68 y.o.   MRN: [id number]  HPI The patient comes in today for an acute sick visit. He has known asthma with significant allergic component, and is continuing to have issues despite a recent prednisone pulse. The patient has noticed increasing cough with purulent mucus, increased sinus congestion, increased chest congestion, and increased shortness of breath. He has had to increase his prednisone back up to 20 mg a day. He is not having any severe breathing issues.   Review of Systems  Constitutional: Negative for fever and unexpected weight change.  HENT: Negative for congestion, dental problem, ear pain, nosebleeds, postnasal drip, rhinorrhea, sinus pressure, sneezing, sore throat and trouble swallowing.   Eyes: Negative for redness and itching.  Respiratory: Positive for cough, chest tightness, shortness of breath and wheezing.        Change in color mucus  Cardiovascular: Negative for palpitations and leg swelling.  Gastrointestinal: Negative for nausea and vomiting.  Genitourinary: Negative for dysuria.  Musculoskeletal: Negative for joint swelling.  Skin: Negative for rash.  Neurological: Negative for headaches.  Hematological: Does not bruise/bleed easily.  Psychiatric/Behavioral: Negative for dysphoric mood. The patient is not nervous/anxious.        Objective:   Physical Exam Thin male in no acute distress Nose without purulence or discharge noted Oropharynx clear Neck without lymphadenopathy or thyromegaly Chest with fairly clear breath sounds, no active wheezing Cardiac exam with regular rate and rhythm Lower extremities without edema, no cyanosis Alert and oriented, moves all 4 extremities.       Assessment & Plan:

## 2013-08-10 NOTE — Assessment & Plan Note (Signed)
The patient is having some increased shortness of breath, and has increased his prednisone because of this. I suspect this is being triggered by acute bronchitis, and I've asked him to try and taper his prednisone back down to his maintenance of 5 mg a day as tolerated. I will treat his pulmonary infection with an antibiotic.

## 2013-08-10 NOTE — Patient Instructions (Signed)
Will treat with augmentin 875mg  one in am and pm for next 7 days Taper your prednisone back down to 5mg  over time. Please call if your breathing and symptoms do not return to baseline Keep follow up apptm already scheduled.

## 2013-08-16 ENCOUNTER — Ambulatory Visit (INDEPENDENT_AMBULATORY_CARE_PROVIDER_SITE_OTHER): Payer: Medicare Other

## 2013-08-16 DIAGNOSIS — J309 Allergic rhinitis, unspecified: Secondary | ICD-10-CM

## 2013-08-23 ENCOUNTER — Ambulatory Visit (INDEPENDENT_AMBULATORY_CARE_PROVIDER_SITE_OTHER): Payer: Medicare Other

## 2013-08-23 DIAGNOSIS — J309 Allergic rhinitis, unspecified: Secondary | ICD-10-CM | POA: Diagnosis not present

## 2013-08-30 ENCOUNTER — Ambulatory Visit (INDEPENDENT_AMBULATORY_CARE_PROVIDER_SITE_OTHER): Payer: Medicare Other

## 2013-08-30 DIAGNOSIS — J309 Allergic rhinitis, unspecified: Secondary | ICD-10-CM

## 2013-08-31 ENCOUNTER — Ambulatory Visit (INDEPENDENT_AMBULATORY_CARE_PROVIDER_SITE_OTHER): Payer: Medicare Other

## 2013-08-31 DIAGNOSIS — J309 Allergic rhinitis, unspecified: Secondary | ICD-10-CM

## 2013-09-02 DIAGNOSIS — M25569 Pain in unspecified knee: Secondary | ICD-10-CM | POA: Diagnosis not present

## 2013-09-02 DIAGNOSIS — M1710 Unilateral primary osteoarthritis, unspecified knee: Secondary | ICD-10-CM | POA: Diagnosis not present

## 2013-09-06 ENCOUNTER — Ambulatory Visit (INDEPENDENT_AMBULATORY_CARE_PROVIDER_SITE_OTHER): Payer: Medicare Other

## 2013-09-06 DIAGNOSIS — J309 Allergic rhinitis, unspecified: Secondary | ICD-10-CM | POA: Diagnosis not present

## 2013-09-13 ENCOUNTER — Ambulatory Visit (INDEPENDENT_AMBULATORY_CARE_PROVIDER_SITE_OTHER): Payer: Medicare Other

## 2013-09-13 DIAGNOSIS — J309 Allergic rhinitis, unspecified: Secondary | ICD-10-CM | POA: Diagnosis not present

## 2013-09-14 ENCOUNTER — Ambulatory Visit (INDEPENDENT_AMBULATORY_CARE_PROVIDER_SITE_OTHER): Payer: Medicare Other | Admitting: Cardiology

## 2013-09-14 ENCOUNTER — Encounter: Payer: Self-pay | Admitting: Cardiology

## 2013-09-14 VITALS — BP 138/78 | HR 64 | Ht 74.0 in | Wt 160.2 lb

## 2013-09-14 DIAGNOSIS — E785 Hyperlipidemia, unspecified: Secondary | ICD-10-CM | POA: Diagnosis not present

## 2013-09-14 DIAGNOSIS — I4891 Unspecified atrial fibrillation: Secondary | ICD-10-CM | POA: Diagnosis not present

## 2013-09-14 DIAGNOSIS — J449 Chronic obstructive pulmonary disease, unspecified: Secondary | ICD-10-CM

## 2013-09-14 NOTE — Patient Instructions (Signed)
Your physician recommends that you continue on your current medications as directed. Please refer to the Current Medication list given to you today.  Your physician wants you to follow-up in: 6 months with Dr. Skains. You will receive a reminder letter in the mail two months in advance. If you don't receive a letter, please call our office to schedule the follow-up appointment.  

## 2013-09-14 NOTE — Progress Notes (Signed)
[facility] [address], [place]  [address] Phone: [phone] Fax:  [phone]  Date:  09/14/2013   ID:  Levi [name], DOB [birth year], MRN [id number]  PCP:  Orpah Melter, MD   History of Present Illness: HAI GRABE is a 68 y.o. male paroxysmal atrial fibrillation in December of 2013 here for followup.  PAF-low-dose Cardizem is being utilized. Echocardiogram demonstrated EF in the 40-50% range. Prior catheterization in 2010 showed no flow-limiting CAD. Baseline COPD.   CHADSVAS - 1 (age).  Occasional swelling in his ankles. When exercising, he may rarely feel a single pounding heartbeat. No shortness of breath, no syncope. He is had issues with his rods in his back. Long-term prednisone has made his bones frail.    Wt Readings from Last 3 Encounters:  09/14/13 160 lb 3.2 oz (72.666 kg)  08/10/13 157 lb (71.215 kg)  07/20/13 162 lb 3.2 oz (73.573 kg)     Past Medical History  Diagnosis Date  . Asthma   . ALLERGIC RHINITIS   . COPD (chronic obstructive pulmonary disease)   . Hyperlipidemia     takes Lipitor daily  . Agent orange exposure 1970's    takes Imdur,Diltiazem, and Atacand daily  . Joint pain   . Joint swelling   . Elevated uric acid in blood     takes Allopurinol daily  . Bruises easily     d/t prednisone daily  . GERD (gastroesophageal reflux disease)     takes Protonix daily  . Diverticulitis   . Enlarged prostate     but not on any meds  . Pneumonia ~ 2010    "once" (04/28/2012)  . Chronic bronchitis     "used to get it q yr; last time ?2008" (04/28/2012)  . H/O hiatal hernia   . Degenerative disk disease     "qwhere" (04/28/2012)  . Personal history of colonic adenomas 02/09/2013  . Atrial fibrillation   . Shortness of breath     lying/sitting/standing    Past Surgical History  Procedure Laterality Date  . Knee surgery    . Gallbladder surgery    . Total hip arthroplasty  2011    "left" (04/28/2012)  . Lateral /  posterior combined fusion lumbar spine  2012  . Joint replacement    . Total knee arthroplasty  01/10/2012    Procedure: TOTAL KNEE ARTHROPLASTY;  Surgeon: [name], MD;  Location: [facility];  Service: Orthopedics;;  left total knee arthroplasty  . Cardiac catheterization  11/2008    [name] - 20% calcified non flow limiting left main, 50% EF apical hypokinesis  . Knee arthroscopy      "had them both scoped a bunch of times" (12/07/27/2011)  . Colonoscopy    . Esophagogastroduodenoscopy    . Reverse shoulder arthroplasty  04/28/2012    left  . Tonsillectomy and adenoidectomy  1954  . Cholecystectomy  1990's  . Reverse shoulder arthroplasty  04/28/2012    Procedure: REVERSE SHOULDER ARTHROPLASTY;  Surgeon: [name], MD;  Location: [facility];  Service: Orthopedics;  Laterality: Left;  Left shouder reverse total shoulder arthroplasty  . Shoulder surgery    . Anterior cervical decomp/discectomy fusion N/A 07/21/2012    Procedure: ANTERIOR CERVICAL DECOMPRESSION/DISCECTOMY FUSION 2 LEVELS;  Surgeon: [name], MD;  Location: [facility];  Service: Neurosurgery;  Laterality: N/A;  C4-5 C5-6 Anterior cervical decompression/diskectomy/fusion  . Eye surgery  2011    "  bilaterally; steroidal encapsulation" (04/28/2012)    Current Outpatient Prescriptions  Medication Sig Dispense Refill  . albuterol (PROAIR HFA) 108 (90 BASE) MCG/ACT inhaler Inhale 2 puffs into the lungs every 4 (four) hours as needed for wheezing.  3 Inhaler  3  . allopurinol (ZYLOPRIM) 100 MG tablet Take 100 mg by mouth daily.        [name] amoxicillin-clavulanate (AUGMENTIN) 875-125 MG per tablet Take 1 tablet by mouth 2 (two) times daily.  14 tablet  0  . atorvastatin (LIPITOR) 10 MG tablet Take 10 mg by mouth daily.       . beclomethasone (QVAR) 80 MCG/ACT inhaler Inhale 2 puffs into the lungs 2 (two) times daily.  1 Inhaler  3  . candesartan (ATACAND) 4 MG tablet Take 0.5 tablets (2 mg total) by mouth daily.  30 tablet   6  . cyclobenzaprine (FLEXERIL) 10 MG tablet Take 10 mg by mouth 3 (three) times daily as needed for muscle spasms.       [name] diltiazem (CARDIZEM CD) 180 MG 24 hr capsule Take 180 mg by mouth daily.      . formoterol (FORADIL AEROLIZER) 12 MCG capsule for inhaler Place 12 mcg into inhaler and inhale every 12 (twelve) hours.      . isosorbide mononitrate (IMDUR) 30 MG 24 hr tablet Take 30 mg by mouth daily.       . pantoprazole (PROTONIX) 40 MG tablet Take 40 mg by mouth 2 (two) times daily.      . predniSONE (DELTASONE) 10 MG tablet Take 20 mg by mouth daily.       No current facility-administered medications for this visit.    Allergies:    Allergies  Allergen Reactions  . Morphine And Related Other (See Comments)    Light headed and sweaty; "my heart races" (04/28/2012)  . Levaquin [Levofloxacin] Itching    Social History:  The patient  reports that he has never smoked. He has never used smokeless tobacco. He reports that he does not drink alcohol or use illicit drugs.   ROS:  Please see the history of present illness.      PHYSICAL EXAM: VS:  BP 138/78  Pulse 64  Ht 6\' 2"  (1.88 m)  Wt 160 lb 3.2 oz (72.666 kg)  BMI 20.56 kg/m2 Well nourished, well developed, in no acute distress HEENT: normal Neck: no JVD Cardiac:  normal S1, S2; RRR; no murmur Lungs:  Somewhat distant breath sounds but overall adequate air movement, mildly barrel chested , no wheezing, rhonchi or rales Abd: soft, nontender, no hepatomegaly Ext: no edema Skin: warm and dry Neuro: no focal abnormalities noted  EKG:  Sinus bradycardia rate 55 with no other abnormalities. Normal intervals. No change from prior.    ASSESSMENT AND PLAN:  1. Paroxysmal atrial fibrillation-no recent symptomatic episodes. Continue with diltiazem 180. Certainly this can be contributing somewhat to his intermittent edema as a side effect. We will continue to monitor. No anticoagulation because of CHADSVAS 1. 2. COPD-Dr. Gwenette Greet.  Stable. 3. Occasional palpitations-described as a "pounding" single beat. This could be a PVC or PAC. No high-risk symptoms with this such as syncope, racing, shortness of breath. We will continue to monitor. It is been quite sometime since he is had another episode. 4. I will see him back in 6 months. 5. Continue with lipid modification. No changes made with statin.  Signed, Candee Furbish, MD [facility]  09/14/2013 3:37 PM

## 2013-09-20 ENCOUNTER — Ambulatory Visit (INDEPENDENT_AMBULATORY_CARE_PROVIDER_SITE_OTHER): Payer: Medicare Other

## 2013-09-20 DIAGNOSIS — J309 Allergic rhinitis, unspecified: Secondary | ICD-10-CM

## 2013-09-27 ENCOUNTER — Ambulatory Visit (INDEPENDENT_AMBULATORY_CARE_PROVIDER_SITE_OTHER): Payer: Medicare Other

## 2013-09-27 DIAGNOSIS — J309 Allergic rhinitis, unspecified: Secondary | ICD-10-CM | POA: Diagnosis not present

## 2013-10-01 ENCOUNTER — Telehealth: Payer: Self-pay | Admitting: Cardiology

## 2013-10-01 NOTE — Telephone Encounter (Signed)
New problem   Pt is having a pain in his upper part of his back when he in hales. Pt need to speak to nurse concerning this.

## 2013-10-04 ENCOUNTER — Ambulatory Visit (INDEPENDENT_AMBULATORY_CARE_PROVIDER_SITE_OTHER): Payer: Medicare Other

## 2013-10-04 DIAGNOSIS — J309 Allergic rhinitis, unspecified: Secondary | ICD-10-CM

## 2013-10-11 ENCOUNTER — Ambulatory Visit (INDEPENDENT_AMBULATORY_CARE_PROVIDER_SITE_OTHER): Payer: Medicare Other

## 2013-10-11 DIAGNOSIS — J309 Allergic rhinitis, unspecified: Secondary | ICD-10-CM

## 2013-10-12 ENCOUNTER — Ambulatory Visit: Payer: Medicare Other

## 2013-10-12 ENCOUNTER — Other Ambulatory Visit: Payer: Self-pay | Admitting: *Deleted

## 2013-10-12 DIAGNOSIS — E7800 Pure hypercholesterolemia, unspecified: Secondary | ICD-10-CM

## 2013-10-12 DIAGNOSIS — Z79899 Other long term (current) drug therapy: Secondary | ICD-10-CM

## 2013-10-13 DIAGNOSIS — IMO0002 Reserved for concepts with insufficient information to code with codable children: Secondary | ICD-10-CM | POA: Diagnosis not present

## 2013-10-13 DIAGNOSIS — M545 Low back pain, unspecified: Secondary | ICD-10-CM | POA: Diagnosis not present

## 2013-10-19 ENCOUNTER — Other Ambulatory Visit (INDEPENDENT_AMBULATORY_CARE_PROVIDER_SITE_OTHER): Payer: Medicare Other

## 2013-10-19 ENCOUNTER — Ambulatory Visit (INDEPENDENT_AMBULATORY_CARE_PROVIDER_SITE_OTHER): Payer: Medicare Other

## 2013-10-19 DIAGNOSIS — Z79899 Other long term (current) drug therapy: Secondary | ICD-10-CM

## 2013-10-19 DIAGNOSIS — E7800 Pure hypercholesterolemia, unspecified: Secondary | ICD-10-CM | POA: Diagnosis not present

## 2013-10-19 DIAGNOSIS — J309 Allergic rhinitis, unspecified: Secondary | ICD-10-CM

## 2013-10-19 LAB — HEPATIC FUNCTION PANEL
ALT: 18 U/L (ref 0–53)
AST: 24 U/L (ref 0–37)
Albumin: 3.8 g/dL (ref 3.5–5.2)
Alkaline Phosphatase: 63 U/L (ref 39–117)
Bilirubin, Direct: 0.1 mg/dL (ref 0.0–0.3)
Total Bilirubin: 0.7 mg/dL (ref 0.2–1.2)
Total Protein: 6.5 g/dL (ref 6.0–8.3)

## 2013-10-19 LAB — LIPID PANEL
Cholesterol: 178 mg/dL (ref 0–200)
HDL: 92.8 mg/dL (ref >39.00)
LDL Cholesterol: 73 mg/dL (ref 0–99)
Total CHOL/HDL Ratio: 2
Triglycerides: 61 mg/dL (ref 0.0–149.0)
VLDL: 12.2 mg/dL (ref 0.0–40.0)

## 2013-10-22 ENCOUNTER — Other Ambulatory Visit: Payer: Self-pay | Admitting: Cardiology

## 2013-10-22 DIAGNOSIS — E785 Hyperlipidemia, unspecified: Secondary | ICD-10-CM

## 2013-10-22 NOTE — Telephone Encounter (Signed)
Spoke with wife and she was actually the one that called. Patient was not home but is doing better. Did apologize that she had not received a call back sooner. She did state that she was very upset that no one had called back then but, was appreciative I called.

## 2013-10-25 ENCOUNTER — Ambulatory Visit (INDEPENDENT_AMBULATORY_CARE_PROVIDER_SITE_OTHER): Payer: Medicare Other

## 2013-10-25 DIAGNOSIS — J309 Allergic rhinitis, unspecified: Secondary | ICD-10-CM | POA: Diagnosis not present

## 2013-10-26 ENCOUNTER — Encounter: Payer: Self-pay | Admitting: Internal Medicine

## 2013-10-27 ENCOUNTER — Other Ambulatory Visit: Payer: Self-pay | Admitting: Pulmonary Disease

## 2013-10-28 ENCOUNTER — Other Ambulatory Visit: Payer: Self-pay | Admitting: *Deleted

## 2013-10-28 MED ORDER — ISOSORBIDE MONONITRATE ER 30 MG PO TB24: 30.0000 mg | ORAL_TABLET | Freq: Every day | ORAL | Status: DC

## 2013-11-01 ENCOUNTER — Ambulatory Visit (INDEPENDENT_AMBULATORY_CARE_PROVIDER_SITE_OTHER): Payer: Medicare Other

## 2013-11-01 DIAGNOSIS — J309 Allergic rhinitis, unspecified: Secondary | ICD-10-CM

## 2013-11-08 ENCOUNTER — Ambulatory Visit (INDEPENDENT_AMBULATORY_CARE_PROVIDER_SITE_OTHER): Payer: Medicare Other

## 2013-11-08 DIAGNOSIS — J309 Allergic rhinitis, unspecified: Secondary | ICD-10-CM | POA: Diagnosis not present

## 2013-11-13 ENCOUNTER — Other Ambulatory Visit: Payer: Self-pay | Admitting: Pulmonary Disease

## 2013-11-15 ENCOUNTER — Ambulatory Visit (INDEPENDENT_AMBULATORY_CARE_PROVIDER_SITE_OTHER): Payer: Medicare Other

## 2013-11-15 DIAGNOSIS — J309 Allergic rhinitis, unspecified: Secondary | ICD-10-CM

## 2013-11-16 ENCOUNTER — Other Ambulatory Visit: Payer: Self-pay

## 2013-11-16 MED ORDER — ATORVASTATIN CALCIUM 10 MG PO TABS: 10.0000 mg | ORAL_TABLET | Freq: Every day | ORAL | Status: DC

## 2013-11-22 ENCOUNTER — Ambulatory Visit: Payer: Medicare Other

## 2013-11-30 ENCOUNTER — Ambulatory Visit (INDEPENDENT_AMBULATORY_CARE_PROVIDER_SITE_OTHER): Payer: Medicare Other

## 2013-11-30 DIAGNOSIS — J309 Allergic rhinitis, unspecified: Secondary | ICD-10-CM

## 2013-11-30 DIAGNOSIS — M1710 Unilateral primary osteoarthritis, unspecified knee: Secondary | ICD-10-CM | POA: Diagnosis not present

## 2013-12-08 ENCOUNTER — Ambulatory Visit (INDEPENDENT_AMBULATORY_CARE_PROVIDER_SITE_OTHER): Payer: Medicare Other

## 2013-12-08 DIAGNOSIS — J309 Allergic rhinitis, unspecified: Secondary | ICD-10-CM | POA: Diagnosis not present

## 2013-12-14 ENCOUNTER — Other Ambulatory Visit: Payer: Self-pay | Admitting: Pulmonary Disease

## 2013-12-16 ENCOUNTER — Ambulatory Visit (INDEPENDENT_AMBULATORY_CARE_PROVIDER_SITE_OTHER): Payer: Medicare Other

## 2013-12-16 DIAGNOSIS — J309 Allergic rhinitis, unspecified: Secondary | ICD-10-CM

## 2013-12-24 ENCOUNTER — Ambulatory Visit (INDEPENDENT_AMBULATORY_CARE_PROVIDER_SITE_OTHER): Payer: Medicare Other | Admitting: Internal Medicine

## 2013-12-24 ENCOUNTER — Encounter: Payer: Self-pay | Admitting: Internal Medicine

## 2013-12-24 ENCOUNTER — Ambulatory Visit (INDEPENDENT_AMBULATORY_CARE_PROVIDER_SITE_OTHER): Payer: Medicare Other

## 2013-12-24 VITALS — BP 106/60 | HR 56 | Ht 74.0 in | Wt 153.0 lb

## 2013-12-24 DIAGNOSIS — J309 Allergic rhinitis, unspecified: Secondary | ICD-10-CM

## 2013-12-24 DIAGNOSIS — J301 Allergic rhinitis due to pollen: Secondary | ICD-10-CM | POA: Diagnosis not present

## 2013-12-24 DIAGNOSIS — B370 Candidal stomatitis: Secondary | ICD-10-CM

## 2013-12-24 DIAGNOSIS — B3781 Candidal esophagitis: Secondary | ICD-10-CM | POA: Diagnosis not present

## 2013-12-24 NOTE — Progress Notes (Signed)
Patient ID: Levi [name], male    DOB: [birth year], 68 y.o.   MRN: [id number]  HPI 12/24/10- 89 yoM never smoker followed for allergy/ asthma, rhinitis by Dr Annamaria Boots, for asthma/ hx ABPA by Dr Gwenette Greet Last here June 25, 2010 Last visit he had a rash on his hand. That resolved with topical steroid, but has recently recurred in the same area on dorsum right hand. Allergy vaccine- 1:10 [facility] Had more asthma at end of April- saw Dr Gwenette Greet. Maintenance prednisone was briefly increased, now back to 5 mg daily. Has needed rescue inhaler only 2-3x since then.  Occasional stuffy nose- uses saline nasal rinse.  CXR Feb, 2012- clear, NAD.   06/26/11- 64 yoM never smoker followed for allergy/ asthma, rhinitis by Dr Annamaria Boots, for asthma/ hx ABPA by Dr Gwenette Greet He was itching and sneezing a few days ago but that resolved. He had a temperature up to 100.3 with sweating at night, light green sputum, raspy crackle in his chest. He felt that he had a cold starting to get better. Denies sore throat sinus pain or chest pain. Continues allergy vaccine without problems and is satisfied that it is helpful. Continues prednisone maintenance 5 mg daily. Has not had significant exacerbation of wheezing with his recent acute illness.  12/27/11-  64 yoM never smoker followed for allergy/ asthma, rhinitis by Dr Annamaria Boots, for asthma/ hx ABPA by Dr Gwenette Greet Doing well on vaccine and denies any trouble/flare ups at this time Continue vaccine 1:10 [facility]. Spring was difficult season, with some tightness, but doing well now.  Always some postnasal drip and nose "floods" at times. Rare headache.  12/28/12-  64 yoM never smoker followed for allergy/ asthma, rhinitis by Dr Annamaria Boots, for asthma/ hx ABPA by Dr Gwenette Greet FOLLOWS FOR: still on vaccine 1:10 [place] and doing well. Says it helps. Maintenance prednisone 10 mg daily. In early summer needed more rescue inhaler blamed on air quality. Doing particularly well now. Doesn't remember experience with Dymista or  Flonase. Uses occasional Zyrtec. Has a chronic septal perforation so we agreed not to use any nasal spray now.  12/24/13-  67 yoM never smoker followed for allergy/ asthma, rhinitis by Dr Annamaria Boots, for asthma/ hx ABPA by Dr Gwenette Greet FOLLOWS FOR: still on allergy vaccine 1:10 [facility] and doing well Has seen Dr Gwenette Greet for exacerbations requiring prednisone burst.  He says he is generally "better off" on allergy shots Does worst in spring season and with current humidity. Not needing nasal spray. Likes saline rinse by squirt bottle.  ROS-see HPI Constitutional:   No-   weight loss, night sweats, fevers, chills, fatigue, lassitude. HEENT:   +rare headaches, difficulty swallowing, tooth/dental problems, sore throat,      No- sneezing, itching, ear ache, nasal congestion, post nasal drip,  CV:  No-   chest pain, orthopnea, PND, swelling in lower extremities, anasarca, dizziness, palpitations Resp: No-   shortness of breath with exertion or at rest.             No-  productive cough,  No non-productive cough,  No- coughing up of blood.              No- change in color of mucus.  No- wheezing.   Skin: No-   rash or lesions. GI:  No-   heartburn, indigestion, abdominal pain, nausea, vomiting, GU:  MS:  No-   joint pain or swelling.   Neuro-     nothing unusual Psych:  No- change in mood  or affect. No depression or anxiety.  No memory loss.  Objective:   Physical Exam General- Alert, Oriented, Affect-appropriate, Distress- none acute   Tall, slender, relaxed Skin- , excoriation- none  Lymphadenopathy- none Head- atraumatic            Eyes- Gross vision intact, PERRLA, conjunctivae clear secretions            Ears- Hearing, canals normal            Nose- clear , Septal dev,  polyps, erosion,  + perforation             Throat- Mallampati II , mucosa + thrush, drainage- none, tonsils- atrophic. Teeth crowned.  Neck- flexible , trachea midline, no stridor , thyroid nl, carotid no bruit Chest - symmetrical  excursion , unlabored           Heart/CV- RRR , no murmur , no gallop  , no rub, nl s1 s2                           - JVD- none , edema- none, stasis changes- none, varices- none           Lung- quiet chest, wheeze- none, cough- none , dullness-none, rub- none           Chest wall-  Abd- Br/ Gen/ Rectal- Not done, not indicated Extrem- cyanosis- none, clubbing, none, atrophy- none, strength- nl.+ Superficial varices Neuro- grossly intact to observation

## 2013-12-24 NOTE — Patient Instructions (Signed)
We can continue allergy vaccine 1:10 [place]  Ok to continue present meds.[name]   Keep follow-up appointment with Dr Gwenette Greet and please call as needed

## 2013-12-28 ENCOUNTER — Ambulatory Visit: Payer: Medicare Other | Admitting: Internal Medicine

## 2014-01-03 ENCOUNTER — Ambulatory Visit (INDEPENDENT_AMBULATORY_CARE_PROVIDER_SITE_OTHER): Payer: Medicare Other

## 2014-01-03 DIAGNOSIS — J309 Allergic rhinitis, unspecified: Secondary | ICD-10-CM

## 2014-01-06 DIAGNOSIS — M1710 Unilateral primary osteoarthritis, unspecified knee: Secondary | ICD-10-CM | POA: Diagnosis not present

## 2014-01-10 ENCOUNTER — Ambulatory Visit (INDEPENDENT_AMBULATORY_CARE_PROVIDER_SITE_OTHER): Payer: Medicare Other

## 2014-01-10 DIAGNOSIS — J309 Allergic rhinitis, unspecified: Secondary | ICD-10-CM

## 2014-01-11 ENCOUNTER — Ambulatory Visit (INDEPENDENT_AMBULATORY_CARE_PROVIDER_SITE_OTHER): Payer: Medicare Other

## 2014-01-11 DIAGNOSIS — J309 Allergic rhinitis, unspecified: Secondary | ICD-10-CM

## 2014-01-17 ENCOUNTER — Ambulatory Visit (INDEPENDENT_AMBULATORY_CARE_PROVIDER_SITE_OTHER): Payer: Medicare Other | Admitting: Pulmonary Disease

## 2014-01-17 ENCOUNTER — Ambulatory Visit (INDEPENDENT_AMBULATORY_CARE_PROVIDER_SITE_OTHER): Payer: Medicare Other

## 2014-01-17 ENCOUNTER — Encounter: Payer: Self-pay | Admitting: Pulmonary Disease

## 2014-01-17 VITALS — BP 102/60 | HR 61 | Temp 97.8°F | Ht 74.0 in | Wt 157.0 lb

## 2014-01-17 DIAGNOSIS — J309 Allergic rhinitis, unspecified: Secondary | ICD-10-CM | POA: Diagnosis not present

## 2014-01-17 DIAGNOSIS — J449 Chronic obstructive pulmonary disease, unspecified: Secondary | ICD-10-CM | POA: Diagnosis not present

## 2014-01-17 DIAGNOSIS — J4489 Other specified chronic obstructive pulmonary disease: Secondary | ICD-10-CM

## 2014-01-17 MED ORDER — PREDNISONE 5 MG PO TABS: ORAL_TABLET | ORAL | Status: DC

## 2014-01-17 MED ORDER — PANTOPRAZOLE SODIUM 40 MG PO TBEC: DELAYED_RELEASE_TABLET | ORAL | Status: DC

## 2014-01-17 NOTE — Patient Instructions (Signed)
No change in current medications Continue to minimize prednisone as much as possible between 2.5-10mg  a day. Will send in 30 day prescription for your prednisone 5mg  (#50) as directed and protonix with 11 fills.  followup with me again in 43mos.

## 2014-01-17 NOTE — Progress Notes (Signed)
   Subjective:    Patient ID: Levi [name], male    DOB: [birth year], 68 y.o.   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 He has been maintained on a good asthma regimen, and also low-dose prednisone to currently is at 5 mg a day. The patient feels that he is breathing well, and has not had a recent acute exacerbation. He rarely uses his rescue inhaler through the summer.   Review of Systems  Constitutional: Negative for fever and unexpected weight change.  HENT: Negative for congestion, dental problem, ear pain, nosebleeds, postnasal drip, rhinorrhea, sinus pressure, sneezing, sore throat and trouble swallowing.   Eyes: Negative for redness and itching.  Respiratory: Negative for cough, chest tightness, shortness of breath and wheezing.   Cardiovascular: Negative for palpitations and leg swelling.  Gastrointestinal: Negative for nausea and vomiting.  Genitourinary: Negative for dysuria.  Musculoskeletal: Negative for joint swelling.  Skin: Negative for rash.  Neurological: Negative for headaches.  Hematological: Does not bruise/bleed easily.  Psychiatric/Behavioral: Negative for dysphoric mood. The patient is not nervous/anxious.        Objective:   Physical Exam Thin male in no acute distress Nose without purulence or discharge noted Neck without lymphadenopathy or thyromegaly Chest with mildly decreased breath sounds, no wheezing Cardiac exam with regular rate and rhythm Lower extremities without edema, no cyanosis Alert and oriented, moves all 4 extremities.       Assessment & Plan:

## 2014-01-17 NOTE — Assessment & Plan Note (Signed)
The patient is doing very well on his current regimen, and has not had a recent acute exacerbation. I've asked him to continue on his current meds, and to try and stay as active as possible. It also asked him to try to minimize his prednisone possible, but not to go below 2.5 mg a day.

## 2014-01-24 ENCOUNTER — Other Ambulatory Visit: Payer: Self-pay

## 2014-01-24 ENCOUNTER — Ambulatory Visit (INDEPENDENT_AMBULATORY_CARE_PROVIDER_SITE_OTHER): Payer: Medicare Other

## 2014-01-24 DIAGNOSIS — J309 Allergic rhinitis, unspecified: Secondary | ICD-10-CM | POA: Diagnosis not present

## 2014-01-24 MED ORDER — DILTIAZEM HCL ER COATED BEADS 180 MG PO CP24: 180.0000 mg | ORAL_CAPSULE | Freq: Every day | ORAL | Status: DC

## 2014-01-26 ENCOUNTER — Encounter: Payer: Self-pay | Admitting: Internal Medicine

## 2014-02-01 ENCOUNTER — Ambulatory Visit (INDEPENDENT_AMBULATORY_CARE_PROVIDER_SITE_OTHER): Payer: Medicare Other

## 2014-02-01 DIAGNOSIS — J309 Allergic rhinitis, unspecified: Secondary | ICD-10-CM

## 2014-02-07 ENCOUNTER — Ambulatory Visit (INDEPENDENT_AMBULATORY_CARE_PROVIDER_SITE_OTHER): Payer: Medicare Other

## 2014-02-07 DIAGNOSIS — J309 Allergic rhinitis, unspecified: Secondary | ICD-10-CM

## 2014-02-08 ENCOUNTER — Other Ambulatory Visit: Payer: Self-pay | Admitting: Pulmonary Disease

## 2014-02-08 DIAGNOSIS — Z125 Encounter for screening for malignant neoplasm of prostate: Secondary | ICD-10-CM | POA: Diagnosis not present

## 2014-02-08 DIAGNOSIS — N138 Other obstructive and reflux uropathy: Secondary | ICD-10-CM | POA: Diagnosis not present

## 2014-02-08 DIAGNOSIS — N401 Enlarged prostate with lower urinary tract symptoms: Secondary | ICD-10-CM | POA: Diagnosis not present

## 2014-02-08 DIAGNOSIS — N139 Obstructive and reflux uropathy, unspecified: Secondary | ICD-10-CM | POA: Diagnosis not present

## 2014-02-14 ENCOUNTER — Ambulatory Visit (INDEPENDENT_AMBULATORY_CARE_PROVIDER_SITE_OTHER): Payer: Medicare Other

## 2014-02-14 DIAGNOSIS — J309 Allergic rhinitis, unspecified: Secondary | ICD-10-CM

## 2014-02-17 ENCOUNTER — Other Ambulatory Visit: Payer: Self-pay | Admitting: Cardiology

## 2014-02-21 ENCOUNTER — Ambulatory Visit (INDEPENDENT_AMBULATORY_CARE_PROVIDER_SITE_OTHER): Payer: Medicare Other

## 2014-02-21 DIAGNOSIS — J309 Allergic rhinitis, unspecified: Secondary | ICD-10-CM

## 2014-02-28 ENCOUNTER — Ambulatory Visit (INDEPENDENT_AMBULATORY_CARE_PROVIDER_SITE_OTHER): Payer: Medicare Other

## 2014-02-28 DIAGNOSIS — Z23 Encounter for immunization: Secondary | ICD-10-CM

## 2014-02-28 DIAGNOSIS — J309 Allergic rhinitis, unspecified: Secondary | ICD-10-CM | POA: Diagnosis not present

## 2014-03-07 ENCOUNTER — Ambulatory Visit (INDEPENDENT_AMBULATORY_CARE_PROVIDER_SITE_OTHER): Payer: Medicare Other

## 2014-03-07 DIAGNOSIS — J309 Allergic rhinitis, unspecified: Secondary | ICD-10-CM

## 2014-03-14 ENCOUNTER — Ambulatory Visit (INDEPENDENT_AMBULATORY_CARE_PROVIDER_SITE_OTHER): Payer: Medicare Other

## 2014-03-14 DIAGNOSIS — J309 Allergic rhinitis, unspecified: Secondary | ICD-10-CM | POA: Diagnosis not present

## 2014-03-21 ENCOUNTER — Ambulatory Visit (INDEPENDENT_AMBULATORY_CARE_PROVIDER_SITE_OTHER): Payer: Medicare Other

## 2014-03-21 DIAGNOSIS — J309 Allergic rhinitis, unspecified: Secondary | ICD-10-CM | POA: Diagnosis not present

## 2014-03-22 ENCOUNTER — Other Ambulatory Visit: Payer: Self-pay | Admitting: Cardiology

## 2014-03-24 ENCOUNTER — Other Ambulatory Visit: Payer: Self-pay

## 2014-03-24 DIAGNOSIS — M7062 Trochanteric bursitis, left hip: Secondary | ICD-10-CM | POA: Diagnosis not present

## 2014-03-24 DIAGNOSIS — M1711 Unilateral primary osteoarthritis, right knee: Secondary | ICD-10-CM | POA: Diagnosis not present

## 2014-03-24 MED ORDER — ISOSORBIDE MONONITRATE ER 30 MG PO TB24
ORAL_TABLET | ORAL | Status: DC
Start: 2014-03-24 — End: 2014-03-24

## 2014-03-26 IMAGING — CR DG SHOULDER 1V*L*
2 series · 2 of 2 positions shown · non-contrast
Comparison: 04/26/2010 MR

CLINICAL DATA: Shoulder pain

PORTABLE LEFT SHOULDER - 2+ VIEW

[AP (1 of 2)]
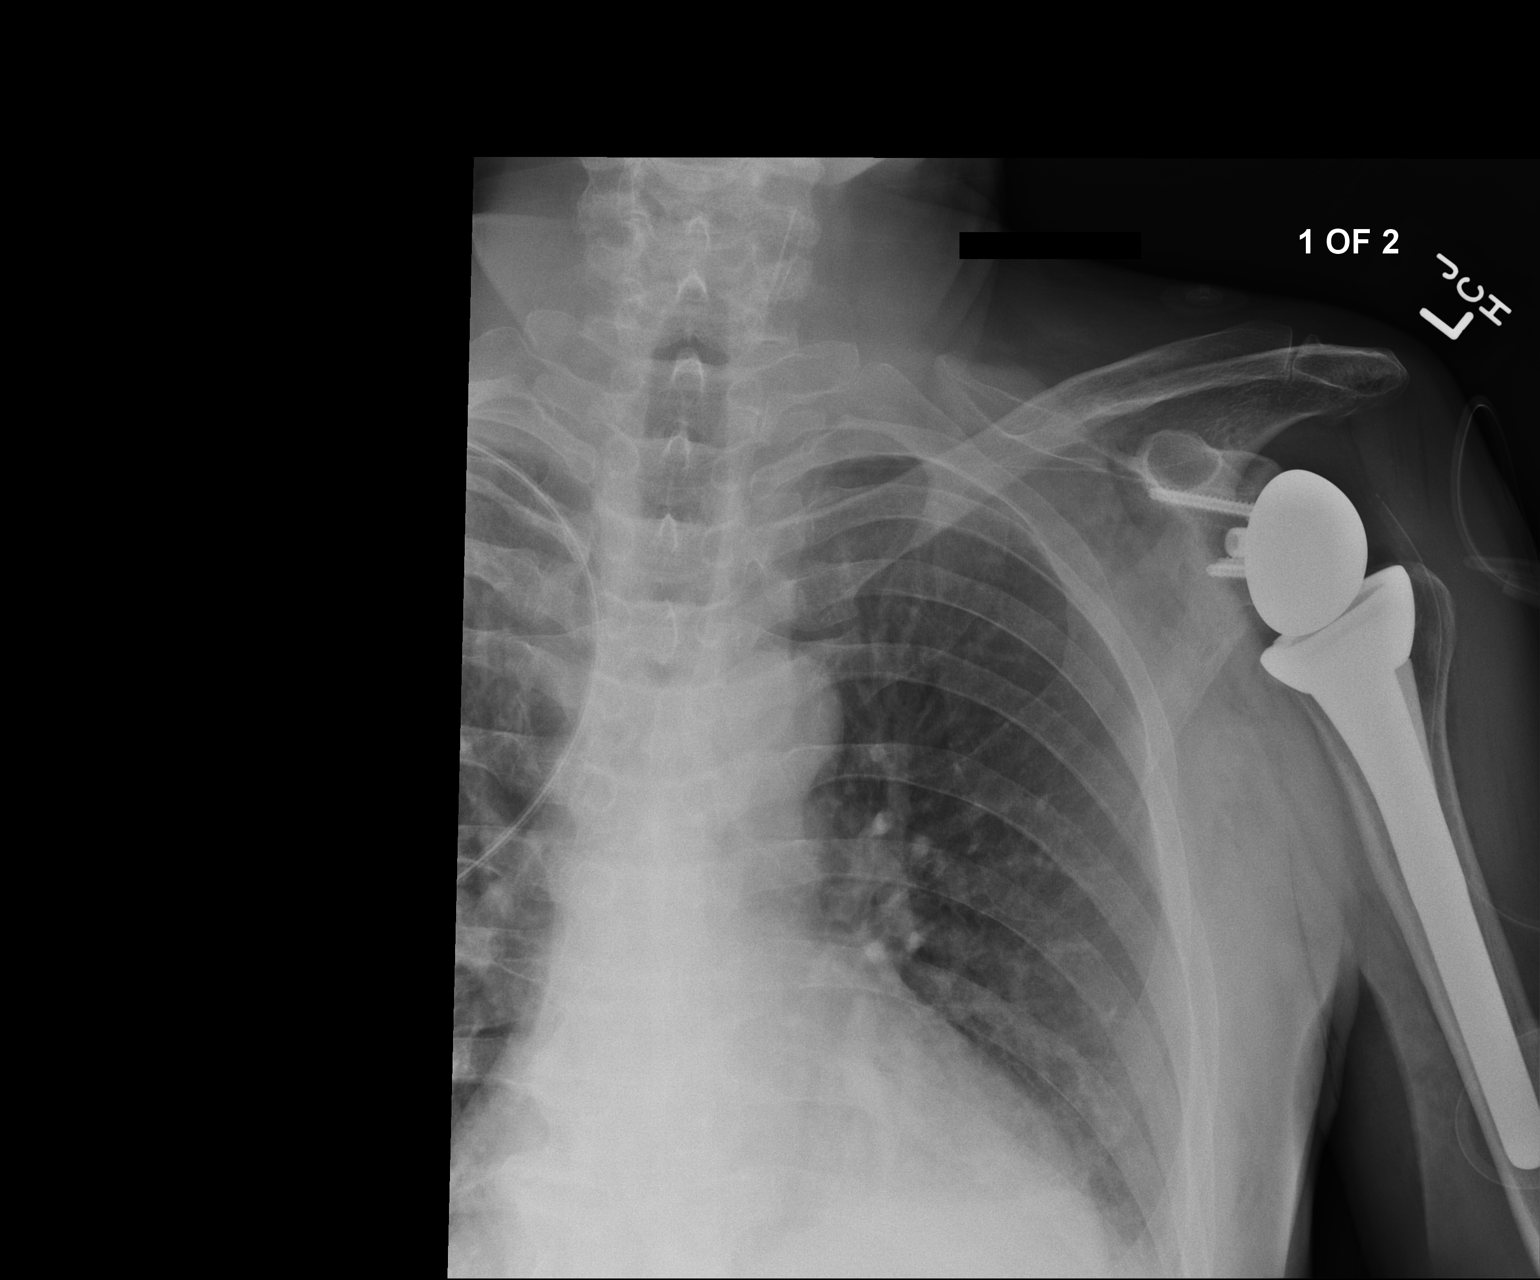

[AP (2 of 2)]
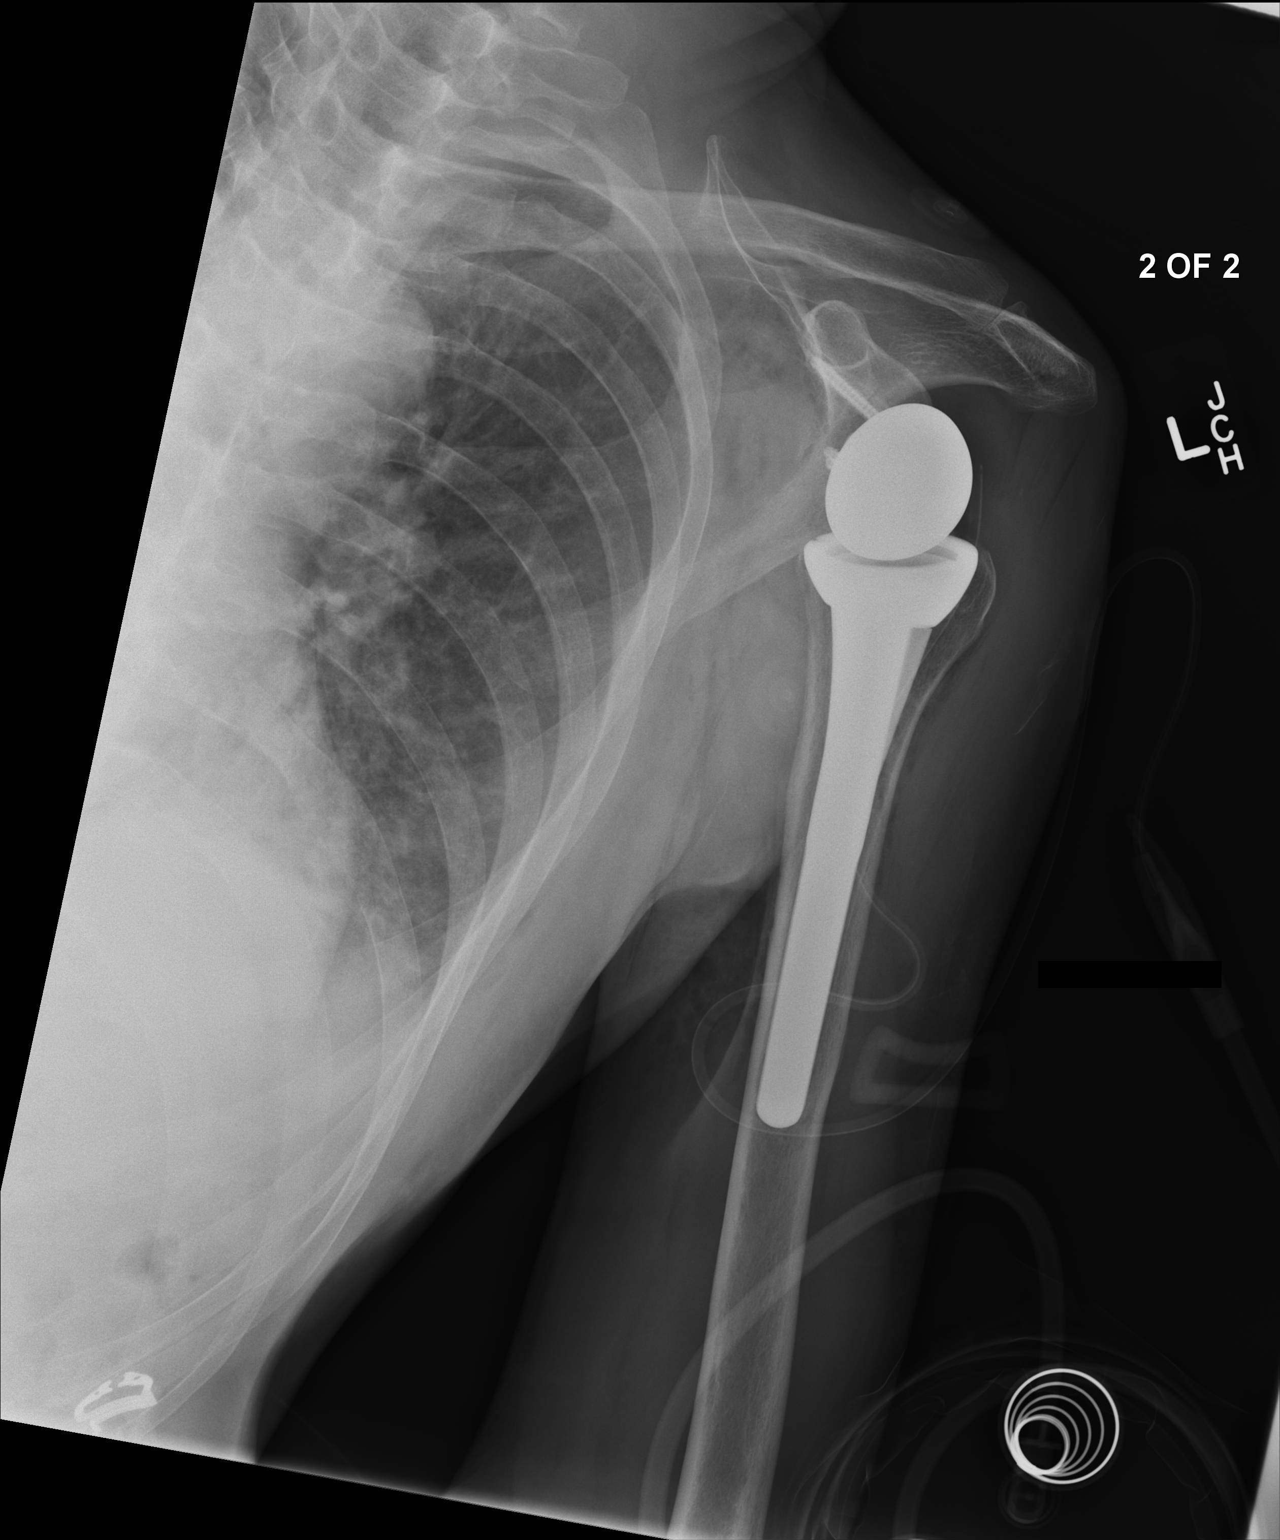

[2 of 2 positions shown; findings below may reference images not displayed]

FINDINGS: Portable examination demonstrates reverse total shoulder
replacement.  Satisfactory position and alignment of the glenoid
and humeral components.
IMPRESSION: As above.

## 2014-03-28 ENCOUNTER — Encounter: Payer: Self-pay | Admitting: Cardiology

## 2014-03-28 ENCOUNTER — Ambulatory Visit (INDEPENDENT_AMBULATORY_CARE_PROVIDER_SITE_OTHER): Payer: Medicare Other

## 2014-03-28 ENCOUNTER — Ambulatory Visit (INDEPENDENT_AMBULATORY_CARE_PROVIDER_SITE_OTHER): Payer: Medicare Other | Admitting: Cardiology

## 2014-03-28 VITALS — BP 110/78 | HR 66 | Ht 74.0 in | Wt 154.0 lb

## 2014-03-28 DIAGNOSIS — I480 Paroxysmal atrial fibrillation: Secondary | ICD-10-CM | POA: Diagnosis not present

## 2014-03-28 DIAGNOSIS — R002 Palpitations: Secondary | ICD-10-CM | POA: Insufficient documentation

## 2014-03-28 DIAGNOSIS — E785 Hyperlipidemia, unspecified: Secondary | ICD-10-CM | POA: Diagnosis not present

## 2014-03-28 DIAGNOSIS — J309 Allergic rhinitis, unspecified: Secondary | ICD-10-CM

## 2014-03-28 DIAGNOSIS — I481 Persistent atrial fibrillation: Secondary | ICD-10-CM

## 2014-03-28 DIAGNOSIS — J449 Chronic obstructive pulmonary disease, unspecified: Secondary | ICD-10-CM | POA: Diagnosis not present

## 2014-03-28 NOTE — Patient Instructions (Signed)
The current medical regimen is effective;  continue present plan and medications.  Follow up in 6 months with Dr. Skains.  You will receive a letter in the mail 2 months before you are due.  Please call us when you receive this letter to schedule your follow up appointment.  

## 2014-03-28 NOTE — Progress Notes (Signed)
[facility]. [address] [place], [place]  [address] Phone: [phone] Fax:  [phone]  Date:  03/28/2014   ID:  Levi [name], DOB 1946-03-20, MRN [id number]  PCP:  Orpah Melter, MD   History of Present Illness: Levi [name] is a 68 y.o. male paroxysmal atrial fibrillation in December of 2013 here for followup.  PAF-low-dose Cardizem is being utilized. Echocardiogram demonstrated EF in the 40-50% range. Prior catheterization in 2010 showed no flow-limiting CAD. Baseline COPD.   CHADSVAS - 1 (age).  Occasional swelling in his ankles. When exercising, he may rarely feel a single pounding heartbeat. No shortness of breath, no syncope. He is had issues with his rods in his back. Long-term prednisone has made his bones frail.  Rare isolated pounding, PVC-like sensation. Occasional twinge in chest. Wife ran [employer].    Wt Readings from Last 3 Encounters:  03/28/14 154 lb (69.854 kg)  01/17/14 157 lb (71.215 kg)  12/24/13 153 lb (69.4 kg)     Past Medical History  Diagnosis Date  . Asthma   . ALLERGIC RHINITIS   . COPD (chronic obstructive pulmonary disease)   . Hyperlipidemia     takes Lipitor daily  . Agent orange exposure 1970's    takes Imdur,Diltiazem, and Atacand daily  . Joint pain   . Joint swelling   . Elevated uric acid in blood     takes Allopurinol daily  . Bruises easily     d/t prednisone daily  . GERD (gastroesophageal reflux disease)     takes Protonix daily  . Diverticulitis   . Enlarged prostate     but not on any meds  . Pneumonia ~ 2010    "once" (04/28/2012)  . Chronic bronchitis     "used to get it q yr; last time ?2008" (04/28/2012)  . H/O hiatal hernia   . Degenerative disk disease     "qwhere" (04/28/2012)  . Personal history of colonic adenomas 02/09/2013  . Atrial fibrillation   . Shortness of breath     lying/sitting/standing    Past Surgical History  Procedure Laterality Date  . Knee surgery    .  Gallbladder surgery    . Total hip arthroplasty  2011    "left" (04/28/2012)  . Lateral / posterior combined fusion lumbar spine  2012  . Joint replacement    . Total knee arthroplasty  01/10/2012    Procedure: TOTAL KNEE ARTHROPLASTY;  Surgeon: [name], MD;  Location: [facility];  Service: Orthopedics;;  left total knee arthroplasty  . Cardiac catheterization  11/2008    [name] - 20% calcified non flow limiting left main, 50% EF apical hypokinesis  . Knee arthroscopy      "had them both scoped a bunch of times" (12/07/27/2011)  . Colonoscopy    . Esophagogastroduodenoscopy    . Reverse shoulder arthroplasty  04/28/2012    left  . Tonsillectomy and adenoidectomy  1954  . Cholecystectomy  1990's  . Reverse shoulder arthroplasty  04/28/2012    Procedure: REVERSE SHOULDER ARTHROPLASTY;  Surgeon: [name], MD;  Location: [facility];  Service: Orthopedics;  Laterality: Left;  Left shouder reverse total shoulder arthroplasty  . Shoulder surgery    . Anterior cervical decomp/discectomy fusion N/A 07/21/2012    Procedure: ANTERIOR CERVICAL DECOMPRESSION/DISCECTOMY FUSION 2 LEVELS;  Surgeon: [name], MD;  Location: [facility];  Service: Neurosurgery;  Laterality: N/A;  C4-5 C5-6  Anterior cervical decompression/diskectomy/fusion  . Eye surgery  2011    "bilaterally; steroidal encapsulation" (04/28/2012)    Current Outpatient Prescriptions  Medication Sig Dispense Refill  . albuterol (PROAIR HFA) 108 (90 BASE) MCG/ACT inhaler Inhale 2 puffs into the lungs every 4 (four) hours as needed for wheezing. 3 Inhaler 3  . allopurinol (ZYLOPRIM) 100 MG tablet Take 100 mg by mouth daily.      [name] atorvastatin (LIPITOR) 10 MG tablet Take 1 tablet (10 mg total) by mouth daily. 30 tablet 6  . beclomethasone (QVAR) 80 MCG/ACT inhaler Inhale 2 puffs into the lungs 2 (two) times daily. 1 Inhaler 3  . candesartan (ATACAND) 4 MG tablet Take 0.5 tablets (2 mg total) by mouth daily. 30 tablet 6  .  diltiazem (CARDIZEM CD) 180 MG 24 hr capsule Take 1 capsule (180 mg total) by mouth daily. 30 capsule 3  . FORADIL AEROLIZER 12 MCG capsule for inhaler PLACE 1 CAPSULE (12 MCG TOTAL) INTO INHALER AND INHALE 2 (TWO) TIMES DAILY. 60 capsule 11  . isosorbide mononitrate (IMDUR) 30 MG 24 hr tablet TAKE 1 TABLET (30 MG TOTAL) BY MOUTH DAILY. 30 tablet 1  . pantoprazole (PROTONIX) 40 MG tablet TAKE 1 TABLET BY MOUTH TWICE A DAY 60 tablet 11  . predniSONE (DELTASONE) 5 MG tablet Take 1/2 tablet daily, Can take up to 2 tablets daily as directed. (Patient taking differently: 10 mg. ) 50 tablet 11   No current facility-administered medications for this visit.    Allergies:    Allergies  Allergen Reactions  . Morphine And Related Other (See Comments)    Light headed and sweaty; "my heart races" (04/28/2012)  . Levaquin [Levofloxacin] Itching    Social History:  The patient  reports that he has never smoked. He has never used smokeless tobacco. He reports that he does not drink alcohol or use illicit drugs.   ROS:  Please see the history of present illness.      PHYSICAL EXAM: VS:  BP 110/78 mmHg  Pulse 66  Ht 6\' 2"  (1.88 m)  Wt 154 lb (69.854 kg)  BMI 19.76 kg/m2 Well nourished, well developed, in no acute distress HEENT: normal Neck: no JVD Cardiac:  normal S1, S2; RRR; no murmur Lungs:  Somewhat distant breath sounds but overall adequate air movement, mildly barrel chested , no wheezing, rhonchi or rales Abd: soft, nontender, no hepatomegaly Ext: no edema Skin: warm and dry Neuro: no focal abnormalities noted  EKG:  03/28/14-sinus rhythm, 68, no other abnormalities, previousSinus bradycardia rate 55 with no other abnormalities. Normal intervals. No change from prior.   Labs: 10/19/13-LDL 73  ASSESSMENT AND PLAN:  1. Paroxysmal atrial fibrillation-no recent symptomatic episodes. Continue with diltiazem 180. Certainly this can be contributing somewhat to his intermittent edema as a side  effect. We will continue to monitor. No anticoagulation because of CHADSVAS 1. 2. COPD-Dr. Gwenette Greet. Stable. 3. Occasional palpitations-described as a "pounding" single beat. This could be a PVC or PAC. No high-risk symptoms with this such as syncope, racing, shortness of breath. We will continue to monitor. It is been quite sometime since he is had another episode. 4. I will see him back in 6 months. 5. Continue with lipid modification. No changes made with statin.  Signed, Candee Furbish, MD [facility]  03/28/2014 8:59 AM

## 2014-04-01 ENCOUNTER — Encounter: Payer: Self-pay | Admitting: Internal Medicine

## 2014-04-03 DIAGNOSIS — B3781 Candidal esophagitis: Secondary | ICD-10-CM

## 2014-04-03 DIAGNOSIS — B370 Candidal stomatitis: Secondary | ICD-10-CM | POA: Insufficient documentation

## 2014-04-03 NOTE — Assessment & Plan Note (Signed)
He continues to feel allergy vaccine has made a difference

## 2014-04-03 NOTE — Assessment & Plan Note (Addendum)
Discussed mouth care Plan- be prepared to offer specific therapy if this doesn't clear quickly

## 2014-04-04 ENCOUNTER — Ambulatory Visit (INDEPENDENT_AMBULATORY_CARE_PROVIDER_SITE_OTHER): Payer: Medicare Other

## 2014-04-04 DIAGNOSIS — J309 Allergic rhinitis, unspecified: Secondary | ICD-10-CM

## 2014-04-11 ENCOUNTER — Ambulatory Visit (INDEPENDENT_AMBULATORY_CARE_PROVIDER_SITE_OTHER): Payer: Medicare Other

## 2014-04-11 DIAGNOSIS — J309 Allergic rhinitis, unspecified: Secondary | ICD-10-CM | POA: Diagnosis not present

## 2014-04-13 DIAGNOSIS — M109 Gout, unspecified: Secondary | ICD-10-CM | POA: Diagnosis not present

## 2014-04-13 DIAGNOSIS — B351 Tinea unguium: Secondary | ICD-10-CM | POA: Diagnosis not present

## 2014-04-13 DIAGNOSIS — Z79899 Other long term (current) drug therapy: Secondary | ICD-10-CM | POA: Diagnosis not present

## 2014-04-18 ENCOUNTER — Encounter: Payer: Self-pay | Admitting: Internal Medicine

## 2014-04-18 ENCOUNTER — Ambulatory Visit (INDEPENDENT_AMBULATORY_CARE_PROVIDER_SITE_OTHER): Payer: Medicare Other

## 2014-04-18 DIAGNOSIS — J309 Allergic rhinitis, unspecified: Secondary | ICD-10-CM

## 2014-04-23 ENCOUNTER — Other Ambulatory Visit: Payer: Self-pay | Admitting: Cardiology

## 2014-04-25 ENCOUNTER — Ambulatory Visit (INDEPENDENT_AMBULATORY_CARE_PROVIDER_SITE_OTHER): Payer: Medicare Other

## 2014-04-25 DIAGNOSIS — J309 Allergic rhinitis, unspecified: Secondary | ICD-10-CM | POA: Diagnosis not present

## 2014-04-26 ENCOUNTER — Ambulatory Visit (INDEPENDENT_AMBULATORY_CARE_PROVIDER_SITE_OTHER): Payer: Medicare Other

## 2014-04-26 DIAGNOSIS — J309 Allergic rhinitis, unspecified: Secondary | ICD-10-CM | POA: Diagnosis not present

## 2014-05-02 ENCOUNTER — Ambulatory Visit (INDEPENDENT_AMBULATORY_CARE_PROVIDER_SITE_OTHER): Payer: Medicare Other

## 2014-05-02 DIAGNOSIS — J309 Allergic rhinitis, unspecified: Secondary | ICD-10-CM

## 2014-05-05 DIAGNOSIS — M4326 Fusion of spine, lumbar region: Secondary | ICD-10-CM | POA: Diagnosis not present

## 2014-05-09 ENCOUNTER — Ambulatory Visit (INDEPENDENT_AMBULATORY_CARE_PROVIDER_SITE_OTHER): Payer: Medicare Other

## 2014-05-09 DIAGNOSIS — M1711 Unilateral primary osteoarthritis, right knee: Secondary | ICD-10-CM | POA: Diagnosis not present

## 2014-05-09 DIAGNOSIS — J309 Allergic rhinitis, unspecified: Secondary | ICD-10-CM

## 2014-05-09 DIAGNOSIS — M7062 Trochanteric bursitis, left hip: Secondary | ICD-10-CM | POA: Diagnosis not present

## 2014-05-11 ENCOUNTER — Other Ambulatory Visit: Payer: Self-pay | Admitting: Pulmonary Disease

## 2014-05-12 ENCOUNTER — Encounter: Payer: Self-pay | Admitting: Adult Health

## 2014-05-12 ENCOUNTER — Ambulatory Visit (INDEPENDENT_AMBULATORY_CARE_PROVIDER_SITE_OTHER): Payer: Medicare Other | Admitting: Adult Health

## 2014-05-12 VITALS — BP 112/72 | HR 61 | Temp 97.1°F | Ht 74.0 in | Wt 162.8 lb

## 2014-05-12 DIAGNOSIS — J209 Acute bronchitis, unspecified: Secondary | ICD-10-CM

## 2014-05-12 MED ORDER — AMOXICILLIN-POT CLAVULANATE 875-125 MG PO TABS: 1.0000 | ORAL_TABLET | Freq: Two times a day (BID) | ORAL | Status: AC

## 2014-05-12 NOTE — Progress Notes (Signed)
   Subjective:    Patient ID: Levi [name], male    DOB: 09-02-1945, 68 y.o.   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 On  low-dose prednisone to currently is at 10 mg a day.     05/12/2014 Acute OV  Presents for an acute office visit. Complains of increased SOB, prod cough with green/yellow mucus, head congestion w/ PND x4 days.   Denies f/c/s, n/v/d, hemoptysis, leg swelling.  Did increase his prednisone yesterday 30mg . Took left over Augmentin last night.  Felt some better today  .      Review of Systems Constitutional:   No  weight loss, night sweats,  Fevers, chills,  +fatigue, or  lassitude.  HEENT:   No headaches,  Difficulty swallowing,  Tooth/dental problems, or  Sore throat,                No sneezing, itching, ear ache,  +nasal congestion, post nasal drip,   CV:  No chest pain,  Orthopnea, PND, swelling in lower extremities, anasarca, dizziness, palpitations, syncope.   GI  No heartburn, indigestion, abdominal pain, nausea, vomiting, diarrhea, change in bowel habits, loss of appetite, bloody stools.   Resp: No chest wall deformity  Skin: no rash or lesions.  GU: no dysuria, change in color of urine, no urgency or frequency.  No flank pain, no hematuria   MS:  No joint pain or swelling.  No decreased range of motion.  No back pain.  Psych:  No change in mood or affect. No depression or anxiety.  No memory loss.         Objective:   Physical Exam  GEN: A/Ox3; pleasant , NAD, elderly   HEENT:  [place]/AT,  EACs-clear, TMs-wnl, NOSE-clear, THROAT-clear, no lesions, no postnasal drip or exudate noted.   NECK:  Supple w/ fair ROM; no JVD; normal carotid impulses w/o bruits; no thyromegaly or nodules palpated; no lymphadenopathy.  RESP  Clear  P & A; w/o, wheezes/ rales/ or rhonchi.no accessory muscle use, no dullness to percussion  CARD:  RRR, no m/r/g  , no peripheral edema, pulses  intact, no cyanosis or clubbing.  GI:   Soft & nt; nml bowel sounds; no organomegaly or masses detected.  Musco: Warm bil, no deformities or joint swelling noted.   Neuro: alert, no focal deficits noted.    Skin: Warm, no lesions or rashes        Assessment & Plan:

## 2014-05-12 NOTE — Patient Instructions (Addendum)
Begin Augmentin 875mg  Twice daily  For 1 week  Mucinex DM Twice daily  As needed  Cough//congestion  Increase Prednisone 30mg  daily for 4 days , 20mg  daily for 4 days then back to 10mg  daily  Please contact office for sooner follow up if symptoms do not improve or worsen or seek emergency care  Follow up Dr. Gwenette Greet as planned and As needed

## 2014-05-12 NOTE — Assessment & Plan Note (Addendum)
Flare with underlying asthma and ABPA   Plan  Begin Augmentin 875mg  Twice daily  For 1 week  Mucinex DM Twice daily  As needed  Cough//congestion  Increase Prednisone 30mg  daily for 4 days , 20mg  daily for 4 days then back to 10mg  daily  Please contact office for sooner follow up if symptoms do not improve or worsen or seek emergency care  Follow up Dr. Gwenette Greet as planned and As needed

## 2014-05-14 ENCOUNTER — Encounter: Payer: Self-pay | Admitting: *Deleted

## 2014-05-16 ENCOUNTER — Ambulatory Visit (INDEPENDENT_AMBULATORY_CARE_PROVIDER_SITE_OTHER): Payer: Medicare Other

## 2014-05-16 DIAGNOSIS — J309 Allergic rhinitis, unspecified: Secondary | ICD-10-CM | POA: Diagnosis not present

## 2014-05-16 NOTE — Progress Notes (Signed)
Ov reviewed, and agree with plan as outlined.  

## 2014-05-17 ENCOUNTER — Other Ambulatory Visit: Payer: Self-pay | Admitting: Orthopedic Surgery

## 2014-05-18 ENCOUNTER — Other Ambulatory Visit: Payer: Self-pay | Admitting: Cardiology

## 2014-05-19 ENCOUNTER — Other Ambulatory Visit: Payer: Self-pay | Admitting: Adult Health

## 2014-05-21 ENCOUNTER — Other Ambulatory Visit: Payer: Self-pay | Admitting: Cardiology

## 2014-05-21 ENCOUNTER — Telehealth: Payer: Self-pay | Admitting: Cardiology

## 2014-05-21 NOTE — Telephone Encounter (Signed)
Pt calling in with increased edema and SOB.  He has used his inhaler which helped some but his edema has not improved.  I have called in Lasix 40 mg 1 daily for 3 days and Kdur 20 meq 1 daily for 3 days.  If he urinates a lot  Or feels weak he will only take one day.  He will need to be seen in the office on Tuesday.  No chest pain.  He does admit to having increased salt in diet with cheese.

## 2014-05-23 ENCOUNTER — Ambulatory Visit (INDEPENDENT_AMBULATORY_CARE_PROVIDER_SITE_OTHER): Payer: Medicare Other

## 2014-05-23 DIAGNOSIS — J309 Allergic rhinitis, unspecified: Secondary | ICD-10-CM

## 2014-05-23 NOTE — Telephone Encounter (Signed)
Agree with plan 

## 2014-05-27 HISTORY — PX: COLONOSCOPY: SHX174

## 2014-05-27 HISTORY — PX: POLYPECTOMY: SHX149

## 2014-05-30 ENCOUNTER — Ambulatory Visit (INDEPENDENT_AMBULATORY_CARE_PROVIDER_SITE_OTHER): Payer: Medicare Other

## 2014-05-30 DIAGNOSIS — J309 Allergic rhinitis, unspecified: Secondary | ICD-10-CM

## 2014-05-31 ENCOUNTER — Ambulatory Visit (INDEPENDENT_AMBULATORY_CARE_PROVIDER_SITE_OTHER): Payer: Medicare Other | Admitting: Pulmonary Disease

## 2014-05-31 ENCOUNTER — Encounter: Payer: Self-pay | Admitting: Pulmonary Disease

## 2014-05-31 VITALS — BP 132/74 | HR 56 | Temp 97.0°F | Ht 74.0 in | Wt 156.4 lb

## 2014-05-31 DIAGNOSIS — J449 Chronic obstructive pulmonary disease, unspecified: Secondary | ICD-10-CM | POA: Diagnosis not present

## 2014-05-31 DIAGNOSIS — J4489 Other specified chronic obstructive pulmonary disease: Secondary | ICD-10-CM

## 2014-05-31 NOTE — Progress Notes (Signed)
   Subjective:    Patient ID: Levi [name], male    DOB: Jul 24, 1945, 69 y.o.   MRN: [id number]  HPI The patient comes in today for an acute sick visit. Has known asthma with chronic airflow obstruction, and was recently seen by her nurse practitioner for acute asthmatic bronchitis. He was treated with a course of antibiotics and prednisone with significant improvement. However, he has never returned to his usual baseline, and is concerned about upcoming knee replacement surgery. He is having no further cough or purulence, but still has some wheezing with a feeling of restriction to airflow. He has still been able to exercise, but clearly is not his usual self.   Review of Systems  Constitutional: Negative for fever and unexpected weight change.  HENT: Positive for congestion. Negative for dental problem, ear pain, nosebleeds, postnasal drip, rhinorrhea, sinus pressure, sneezing, sore throat and trouble swallowing.   Eyes: Negative for redness and itching.  Respiratory: Positive for cough, chest tightness, shortness of breath and wheezing.   Cardiovascular: Negative for palpitations and leg swelling.  Gastrointestinal: Negative for nausea and vomiting.  Genitourinary: Negative for dysuria.  Musculoskeletal: Negative for joint swelling.  Skin: Negative for rash.  Neurological: Negative for headaches.  Hematological: Does not bruise/bleed easily.  Psychiatric/Behavioral: Negative for dysphoric mood. The patient is not nervous/anxious.        Objective:   Physical Exam Thin male in no acute distress Nose without purulence or discharge noted Neck without lymphadenopathy or thyromegaly Chest totally clear to auscultation, no wheezing Cardiac exam with regular rate and rhythm Lower extremities without edema, no cyanosis Alert and oriented, moves all 4 extremities.       Assessment & Plan:

## 2014-05-31 NOTE — Assessment & Plan Note (Signed)
The patient has had a recent episode of acute asthmatic bronchitis, and was treated with a course of antibiotics and a prednisone pulse. He is definitely better, and no longer has chest congestion or purulence, but he does not feel like his breathing has returned to baseline. He is still having occasional wheezing, and a feeling of chest tightness with restriction to airflow. He is scheduled to have upcoming knee replacement surgery, and we'll therefore treat him with another prednisone boost in order to get him ready for the procedure.

## 2014-05-31 NOTE — Patient Instructions (Signed)
Increase your prednisone to 40mg  a day for 2 days, then 30mg  a day for 2 days, then 20mg  a day for 2 days, then back to your 10mg  a day. No change in your breathing medications. Let us know if you have issues with your breathing during your surgery.

## 2014-06-03 ENCOUNTER — Ambulatory Visit (INDEPENDENT_AMBULATORY_CARE_PROVIDER_SITE_OTHER): Payer: Medicare Other | Admitting: Nurse Practitioner

## 2014-06-03 ENCOUNTER — Encounter: Payer: Self-pay | Admitting: Nurse Practitioner

## 2014-06-03 VITALS — BP 110/68 | HR 59 | Ht 74.0 in | Wt 157.0 lb

## 2014-06-03 DIAGNOSIS — J449 Chronic obstructive pulmonary disease, unspecified: Secondary | ICD-10-CM | POA: Diagnosis not present

## 2014-06-03 DIAGNOSIS — E785 Hyperlipidemia, unspecified: Secondary | ICD-10-CM

## 2014-06-03 DIAGNOSIS — I480 Paroxysmal atrial fibrillation: Secondary | ICD-10-CM

## 2014-06-03 NOTE — Progress Notes (Signed)
Patient Name: Levi [name] Date of Encounter: 06/03/2014  Primary Care Provider:  Orpah Melter, MD Primary Cardiologist:  Jerilynn Mages. Gillian Shields, MD   Patient Profile  69 y/o male with a h/o PAF who presents for f/u r/t recent bout of lower extremity swelling.  Problem List   Past Medical History  Diagnosis Date  . Asthma   . ALLERGIC RHINITIS   . COPD (chronic obstructive pulmonary disease)   . Hyperlipidemia     takes Lipitor daily  . Agent orange exposure 1970's    takes Imdur,Diltiazem, and Atacand daily  . Joint pain   . Joint swelling   . Elevated uric acid in blood     takes Allopurinol daily  . Bruises easily     d/t prednisone daily  . GERD (gastroesophageal reflux disease)     takes Protonix daily  . Diverticulitis   . Enlarged prostate     but not on any meds  . History of pneumonia     a. 2010  . Chronic bronchitis     "used to get it q yr; last time ?2008" (04/28/2012)  . H/O hiatal hernia   . Degenerative disk disease     "qwhere" (04/28/2012)  . Personal history of colonic adenomas 02/09/2013  . PAF (paroxysmal atrial fibrillation)     a. Dx 04/2012, CHA2DS2VASc = 1 (age);  b. 04/2012 Echo: EF 40-50%, mild MR.   Past Surgical History  Procedure Laterality Date  . Knee surgery    . Gallbladder surgery    . Total hip arthroplasty  2011    "left" (04/28/2012)  . Lateral / posterior combined fusion lumbar spine  2012  . Joint replacement    . Total knee arthroplasty  01/10/2012    Procedure: TOTAL KNEE ARTHROPLASTY;  Surgeon: [name], MD;  Location: [facility];  Service: Orthopedics;;  left total knee arthroplasty  . Cardiac catheterization  11/2008    [name] - 20% calcified non flow limiting left main, 50% EF apical hypokinesis  . Knee arthroscopy      "had them both scoped a bunch of times" (12/07/27/2011)  . Colonoscopy    . Esophagogastroduodenoscopy    . Reverse shoulder arthroplasty  04/28/2012    left  . Tonsillectomy and adenoidectomy  1954  .  Cholecystectomy  1990's  . Reverse shoulder arthroplasty  04/28/2012    Procedure: REVERSE SHOULDER ARTHROPLASTY;  Surgeon: [name], MD;  Location: [facility];  Service: Orthopedics;  Laterality: Left;  Left shouder reverse total shoulder arthroplasty  . Shoulder surgery    . Anterior cervical decomp/discectomy fusion N/A 07/21/2012    Procedure: ANTERIOR CERVICAL DECOMPRESSION/DISCECTOMY FUSION 2 LEVELS;  Surgeon: [name], MD;  Location: [facility];  Service: Neurosurgery;  Laterality: N/A;  C4-5 C5-6 Anterior cervical decompression/diskectomy/fusion  . Eye surgery  2011    "bilaterally; steroidal encapsulation" (04/28/2012)    Allergies  Allergies  Allergen Reactions  . Morphine And Related Other (See Comments)    Light headed and sweaty; "my heart races" (04/28/2012)  . Levaquin [Levofloxacin] Itching    HPI  69 y/o male with the above problem list.  He has a h/o PAF dating back to 04/2012, which has been kept at [facility] with dilt 180 mg.  He had nl LV fxn by echo in 04/2012.  He also has a h/o COPD and had a recent flare requiring steroids.  In that setting, he noted increased lower extremity edema and called in to  the call staff on 12/26.  3 day Rx's for Lasix and kdur were called in to his pharmacy and he was advised to reduce his salt intake.  With lasix, LEE resolved completely.  He saw Dr. Gwenette Greet on 1/5 and reported ongoing dyspnea and wheezing.  He was placed on another steroid taper and maintained on home doses of inhalers.  He has noted improvement in breathing and resolution of dyspnea.  He has not had any further LEE with this prednisone burst.  He is pending repeat right total knee arthroplasty on 1/22.  Despite his DJD, he continues to run on a daily basis.  He is a life long runner - starting @ age 89, and used to average 120 miles/week.  He does not experience chest pain or dyspnea out of the ordinary during his runs.  He has not had any recurrent palpitations and He  denies pnd, orthopnea, n, v, dizziness, syncope, weight gain, or early satiety.   Home Medications  Prior to Admission medications   Medication Sig Start Date End Date Taking? Authorizing Provider  allopurinol (ZYLOPRIM) 100 MG tablet Take 100 mg by mouth daily.     Yes Historical Provider, MD  atorvastatin (LIPITOR) 10 MG tablet Take 1 tablet (10 mg total) by mouth daily. 11/16/13  Yes Candee Furbish, MD  beclomethasone (QVAR) 80 MCG/ACT inhaler Inhale 2 puffs into the lungs 2 (two) times daily. 07/20/13  Yes Kathee Delton, MD  candesartan (ATACAND) 4 MG tablet Take 0.5 tablets (2 mg total) by mouth daily. 07/29/13  Yes Candee Furbish, MD  diltiazem (CARDIZEM CD) 180 MG 24 hr capsule TAKE 1 CAPSULE (180 MG TOTAL) BY MOUTH DAILY. 05/19/14  Yes Candee Furbish, MD  FORADIL AEROLIZER 12 MCG capsule for inhaler PLACE 1 CAPSULE (12 MCG TOTAL) INTO INHALER AND INHALE 2 (TWO) TIMES DAILY.   Yes Kathee Delton, MD  guaiFENesin (MUCINEX) 600 MG 12 hr tablet Take 600 mg by mouth 2 (two) times daily as needed.   Yes Historical Provider, MD  isosorbide mononitrate (IMDUR) 30 MG 24 hr tablet TAKE 1 TABLET (30 MG TOTAL) BY MOUTH DAILY. 05/23/14  Yes Candee Furbish, MD  pantoprazole (PROTONIX) 40 MG tablet TAKE 1 TABLET BY MOUTH TWICE A DAY 01/17/14  Yes Kathee Delton, MD  predniSONE (DELTASONE) 5 MG tablet Can take up to 2 tabs daily if needed 04/23/14  Yes Historical Provider, MD  PROVENTIL HFA 108 (90 BASE) MCG/ACT inhaler INHALE 2 PUFFS INTO THE LUNGS EVERY 4 (FOUR) HOURS AS NEEDED FOR WHEEZING. 05/11/14  Yes Kathee Delton, MD    Review of Systems  Lower ext edema a few wks ago - resolved.  He denies chest pain, palpitations, dyspnea, pnd, orthopnea, n, v, dizziness, syncope, edema, weight gain, or early satiety.   All other systems reviewed and are otherwise negative except as noted above.  Physical Exam  Blood pressure 110/68, pulse 59, height 6\' 2"  (1.88 m), weight 157 lb (71.215 kg).  General: Pleasant,  NAD Psych: Normal affect. Neuro: Alert and oriented X 3. Moves all extremities spontaneously. HEENT: Normal  Neck: Supple without bruits or JVD. Lungs:  Resp regular and unlabored, CTA. Heart: RRR no s3, s4, or murmurs. Abdomen: Soft, non-tender, non-distended, BS + x 4.  Extremities: No clubbing, cyanosis or edema. DP/PT/Radials 2+ and equal bilaterally.  Accessory Clinical Findings  ECG - SB, 59, non-specific st changes in antlat leads.  Assessment & Plan  1.  Lower extremity edema:  Resolved.  Suspect this was secondary to prednisone taper (was taking 50 mg daily @ the time) and increased salt intake over the holidays.  He has remained active w/o significant limitations. No further w/u @ this time.  2.  PAF:  In sinus rhythm on dilt 180.  He is pending R TKA on 1/22.  Cont CCB throughout perioperative period.  He is not on [place] with CHA2DS2VASc = 1.  3.  HL:  Cont statin.  4.  COPD with h/o asthma:  Currently on prednisone taper after seeing pulm on 1/5.  Feeling better.  Continues to run.  5.  Dispo:  F/U with [name] in May as scheduled.   Murray Hodgkins, NP 06/03/2014, 9:55 AM

## 2014-06-03 NOTE — Patient Instructions (Addendum)
Your physician recommends that you continue on your current medications as directed. Please refer to the Current Medication list given to you today.\   Your physician wants you to follow-up in: 4 months with [name].  You will receive a reminder letter in the mail two months in advance. If you don't receive a letter, please call our office to schedule the follow-up appointment @ [phone].

## 2014-06-04 ENCOUNTER — Other Ambulatory Visit: Payer: Self-pay | Admitting: Pulmonary Disease

## 2014-06-04 ENCOUNTER — Other Ambulatory Visit: Payer: Self-pay | Admitting: Cardiology

## 2014-06-06 ENCOUNTER — Ambulatory Visit: Payer: BLUE CROSS/BLUE SHIELD

## 2014-06-06 NOTE — Pre-Procedure Instructions (Signed)
Levi [name]  06/06/2014   Your procedure is scheduled on:  Friday, January 22nd  Report to [facility] Admitting at 530 AM.  Call this number if you have problems the morning of surgery: [phone]   Remember:   Do not eat food or drink liquids after midnight.   Take these medicines the morning of surgery with A SIP OF WATER: allopurinol, qvar, cardizem, imdur, protonix, prednisone, albuterol if needed   Do not wear jewelry  Do not wear lotions, powders, or perfumes, deodorant.  Do not shave 48 hours prior to surgery. Men may shave face and neck.  Do not bring valuables to the hospital.  [facility] is not responsible for any belongings or valuables.               Contacts, dentures or bridgework may not be worn into surgery.  Leave suitcase in the car. After surgery it may be brought to your room.  For patients admitted to the hospital, discharge time is determined by your treatment team.             Please read over the following fact sheets that you were given: Pain Booklet, Coughing and Deep Breathing, Blood Transfusion Information, MRSA Information and Surgical Site Infection Prevention  [place] - Preparing for Surgery  Before surgery, you can play an important role.  Because skin is not sterile, your skin needs to be as free of germs as possible.  You can reduce the number of germs on you skin by washing with CHG (chlorahexidine gluconate) soap before surgery.  CHG is an antiseptic cleaner which kills germs and bonds with the skin to continue killing germs even after washing.  Please DO NOT use if you have an allergy to CHG or antibacterial soaps.  If your skin becomes reddened/irritated stop using the CHG and inform your nurse when you arrive at Short Stay.  Do not shave (including legs and underarms) for at least 48 hours prior to the first CHG shower.  You may shave your face.  Please follow these instructions carefully:   1.  Shower with CHG Soap the night  before surgery and the morning of Surgery.  2.  If you choose to wash your hair, wash your hair first as usual with your normal shampoo.  3.  After you shampoo, rinse your hair and body thoroughly to remove the shampoo.  4.  Use CHG as you would any other liquid soap.  You can apply CHG directly to the skin and wash gently with scrungie or a clean washcloth.  5.  Apply the CHG Soap to your body ONLY FROM THE NECK DOWN.  Do not use on open wounds or open sores.  Avoid contact with your eyes, ears, mouth and genitals (private parts).  Wash genitals (private parts) with your normal soap.  6.  Wash thoroughly, paying special attention to the area where your surgery will be performed.  7.  Thoroughly rinse your body with warm water from the neck down.  8.  DO NOT shower/wash with your normal soap after using and rinsing off the CHG Soap.  9.  Pat yourself dry with a clean towel.            10.  Wear clean pajamas.            11.  Place clean sheets on your bed the night of your first shower and do not sleep with pets.  Day of Surgery  Do not apply any lotions/deoderants the morning of surgery.  Please wear clean clothes to the hospital/surgery center.

## 2014-06-07 ENCOUNTER — Encounter (HOSPITAL_COMMUNITY)
Admission: RE | Admit: 2014-06-07 | Discharge: 2014-06-07 | Disposition: A | Discharge status: Home / Self Care | Payer: Medicare Other | Source: Ambulatory Visit | Attending: Orthopedic Surgery | Admitting: Orthopedic Surgery

## 2014-06-07 ENCOUNTER — Ambulatory Visit (INDEPENDENT_AMBULATORY_CARE_PROVIDER_SITE_OTHER): Payer: Medicare Other

## 2014-06-07 ENCOUNTER — Other Ambulatory Visit (HOSPITAL_COMMUNITY): Payer: Medicare Other

## 2014-06-07 ENCOUNTER — Encounter (HOSPITAL_COMMUNITY): Payer: Self-pay

## 2014-06-07 DIAGNOSIS — J309 Allergic rhinitis, unspecified: Secondary | ICD-10-CM | POA: Diagnosis not present

## 2014-06-07 DIAGNOSIS — M1711 Unilateral primary osteoarthritis, right knee: Secondary | ICD-10-CM | POA: Insufficient documentation

## 2014-06-07 DIAGNOSIS — J449 Chronic obstructive pulmonary disease, unspecified: Secondary | ICD-10-CM | POA: Insufficient documentation

## 2014-06-07 DIAGNOSIS — Z7952 Long term (current) use of systemic steroids: Secondary | ICD-10-CM | POA: Insufficient documentation

## 2014-06-07 DIAGNOSIS — Z01818 Encounter for other preprocedural examination: Secondary | ICD-10-CM | POA: Diagnosis not present

## 2014-06-07 DIAGNOSIS — Z01812 Encounter for preprocedural laboratory examination: Secondary | ICD-10-CM | POA: Diagnosis not present

## 2014-06-07 LAB — BASIC METABOLIC PANEL
Anion gap: 10 (ref 5–15)
BUN: 24 mg/dL — ABNORMAL HIGH (ref 6–23)
CO2: 24 mmol/L (ref 19–32)
Calcium: 9.9 mg/dL (ref 8.4–10.5)
Chloride: 105 mEq/L (ref 96–112)
Creatinine, Ser: 1.02 mg/dL (ref 0.50–1.35)
GFR calc Af Amer: 86 mL/min — ABNORMAL LOW (ref >90)
GFR calc non Af Amer: 74 mL/min — ABNORMAL LOW (ref >90)
Glucose, Bld: 118 mg/dL — ABNORMAL HIGH (ref 70–99)
Potassium: 4 mmol/L (ref 3.5–5.1)
Sodium: 139 mmol/L (ref 135–145)

## 2014-06-07 LAB — CBC
HCT: 38.3 % — ABNORMAL LOW (ref 39.0–52.0)
Hemoglobin: 12.5 g/dL — ABNORMAL LOW (ref 13.0–17.0)
MCH: 28.6 pg (ref 26.0–34.0)
MCHC: 32.6 g/dL (ref 30.0–36.0)
MCV: 87.6 fL (ref 78.0–100.0)
Platelets: 246 10*3/uL (ref 150–400)
RBC: 4.37 MIL/uL (ref 4.22–5.81)
RDW: 16.9 % — ABNORMAL HIGH (ref 11.5–15.5)
WBC: 6.9 10*3/uL (ref 4.0–10.5)

## 2014-06-07 LAB — PROTIME-INR
INR: 0.97 (ref 0.00–1.49)
Prothrombin Time: 12.9 seconds (ref 11.6–15.2)

## 2014-06-07 LAB — APTT: aPTT: 24 seconds (ref 24–37)

## 2014-06-07 LAB — TYPE AND SCREEN
ABO/RH(D): O POS
Antibody Screen: NEGATIVE

## 2014-06-07 LAB — SURGICAL PCR SCREEN
MRSA, PCR: NEGATIVE
Staphylococcus aureus: POSITIVE — AB

## 2014-06-07 NOTE — Progress Notes (Signed)
Primary - dr. Doyle Askew Cardiologist - dr. Gillian Shields - cardiac clearance in epic ekg in epic from 1/16

## 2014-06-07 NOTE — Progress Notes (Signed)
rx called into [facility] for mupirocin

## 2014-06-09 DIAGNOSIS — M542 Cervicalgia: Secondary | ICD-10-CM | POA: Diagnosis not present

## 2014-06-09 DIAGNOSIS — M1712 Unilateral primary osteoarthritis, left knee: Secondary | ICD-10-CM | POA: Diagnosis not present

## 2014-06-10 ENCOUNTER — Other Ambulatory Visit: Payer: Self-pay | Admitting: Cardiology

## 2014-06-15 ENCOUNTER — Other Ambulatory Visit: Payer: Self-pay

## 2014-06-15 MED ORDER — ISOSORBIDE MONONITRATE ER 30 MG PO TB24: ORAL_TABLET | ORAL | Status: DC

## 2014-06-16 ENCOUNTER — Ambulatory Visit (INDEPENDENT_AMBULATORY_CARE_PROVIDER_SITE_OTHER): Payer: Medicare Other

## 2014-06-16 DIAGNOSIS — J309 Allergic rhinitis, unspecified: Secondary | ICD-10-CM

## 2014-06-17 ENCOUNTER — Inpatient Hospital Stay (HOSPITAL_COMMUNITY)
Admission: RE | Admit: 2014-06-17 | Discharge: 2014-06-18 | DRG: 470 | Disposition: A | Discharge status: Home Health | Payer: Medicare Other | Source: Ambulatory Visit | Attending: Orthopedic Surgery | Admitting: Orthopedic Surgery

## 2014-06-17 ENCOUNTER — Inpatient Hospital Stay (HOSPITAL_COMMUNITY): Payer: Medicare Other | Admitting: Certified Registered Nurse Anesthetist

## 2014-06-17 ENCOUNTER — Encounter (HOSPITAL_COMMUNITY)
Admission: RE | Disposition: A | Discharge status: Home / Self Care | Payer: Self-pay | Source: Ambulatory Visit | Attending: Orthopedic Surgery

## 2014-06-17 ENCOUNTER — Encounter (HOSPITAL_COMMUNITY): Payer: Self-pay | Admitting: Certified Registered Nurse Anesthetist

## 2014-06-17 DIAGNOSIS — J4530 Mild persistent asthma, uncomplicated: Secondary | ICD-10-CM | POA: Diagnosis present

## 2014-06-17 DIAGNOSIS — Z96652 Presence of left artificial knee joint: Secondary | ICD-10-CM | POA: Diagnosis present

## 2014-06-17 DIAGNOSIS — Z96642 Presence of left artificial hip joint: Secondary | ICD-10-CM | POA: Diagnosis present

## 2014-06-17 DIAGNOSIS — M1711 Unilateral primary osteoarthritis, right knee: Principal | ICD-10-CM | POA: Diagnosis present

## 2014-06-17 DIAGNOSIS — Z8701 Personal history of pneumonia (recurrent): Secondary | ICD-10-CM

## 2014-06-17 DIAGNOSIS — M179 Osteoarthritis of knee, unspecified: Secondary | ICD-10-CM | POA: Diagnosis not present

## 2014-06-17 DIAGNOSIS — Z881 Allergy status to other antibiotic agents status: Secondary | ICD-10-CM | POA: Diagnosis not present

## 2014-06-17 DIAGNOSIS — J45909 Unspecified asthma, uncomplicated: Secondary | ICD-10-CM | POA: Diagnosis present

## 2014-06-17 DIAGNOSIS — Z792 Long term (current) use of antibiotics: Secondary | ICD-10-CM

## 2014-06-17 DIAGNOSIS — Z886 Allergy status to analgesic agent status: Secondary | ICD-10-CM

## 2014-06-17 DIAGNOSIS — J449 Chronic obstructive pulmonary disease, unspecified: Secondary | ICD-10-CM | POA: Diagnosis not present

## 2014-06-17 DIAGNOSIS — N400 Enlarged prostate without lower urinary tract symptoms: Secondary | ICD-10-CM | POA: Diagnosis present

## 2014-06-17 DIAGNOSIS — E785 Hyperlipidemia, unspecified: Secondary | ICD-10-CM | POA: Diagnosis present

## 2014-06-17 DIAGNOSIS — M25561 Pain in right knee: Secondary | ICD-10-CM | POA: Diagnosis not present

## 2014-06-17 DIAGNOSIS — J4540 Moderate persistent asthma, uncomplicated: Secondary | ICD-10-CM | POA: Diagnosis present

## 2014-06-17 DIAGNOSIS — I480 Paroxysmal atrial fibrillation: Secondary | ICD-10-CM | POA: Diagnosis present

## 2014-06-17 DIAGNOSIS — K219 Gastro-esophageal reflux disease without esophagitis: Secondary | ICD-10-CM | POA: Diagnosis present

## 2014-06-17 DIAGNOSIS — Z7952 Long term (current) use of systemic steroids: Secondary | ICD-10-CM

## 2014-06-17 DIAGNOSIS — G8918 Other acute postprocedural pain: Secondary | ICD-10-CM | POA: Diagnosis not present

## 2014-06-17 HISTORY — PX: TOTAL KNEE ARTHROPLASTY: SHX125

## 2014-06-17 HISTORY — DX: Unilateral primary osteoarthritis, right knee: M17.11

## 2014-06-17 SURGERY — ARTHROPLASTY, KNEE, TOTAL
Anesthesia: Regional | Site: Knee | Laterality: Right

## 2014-06-17 MED ORDER — GUAIFENESIN ER 600 MG PO TB12
600.0000 mg | ORAL_TABLET | Freq: Two times a day (BID) | ORAL | Status: DC | PRN
  Filled 2014-06-17: qty 1

## 2014-06-17 MED ORDER — BUPIVACAINE LIPOSOME 1.3 % IJ SUSP
20.0000 mL | INTRAMUSCULAR | Status: DC
  Filled 2014-06-17: qty 20

## 2014-06-17 MED ORDER — PROPOFOL 10 MG/ML IV BOLUS
INTRAVENOUS | Status: AC
  Filled 2014-06-17: qty 20

## 2014-06-17 MED ORDER — DILTIAZEM HCL ER COATED BEADS 180 MG PO CP24
180.0000 mg | ORAL_CAPSULE | Freq: Every day | ORAL | Status: DC
  Administered 2014-06-18: 180 mg via ORAL
  Filled 2014-06-17: qty 1

## 2014-06-17 MED ORDER — GLYCOPYRROLATE 0.2 MG/ML IJ SOLN
INTRAMUSCULAR | Status: DC | PRN
  Administered 2014-06-17: 0.6 mg via INTRAVENOUS

## 2014-06-17 MED ORDER — ACETAMINOPHEN 325 MG PO TABS: 650.0000 mg | ORAL_TABLET | Freq: Four times a day (QID) | ORAL | Status: DC | PRN

## 2014-06-17 MED ORDER — PANTOPRAZOLE SODIUM 40 MG PO TBEC
40.0000 mg | DELAYED_RELEASE_TABLET | Freq: Two times a day (BID) | ORAL | Status: DC
  Administered 2014-06-17 – 2014-06-18 (×2): 40 mg via ORAL
  Filled 2014-06-17 (×2): qty 1

## 2014-06-17 MED ORDER — PROPOFOL 10 MG/ML IV BOLUS
INTRAVENOUS | Status: DC | PRN
  Administered 2014-06-17: 150 mg via INTRAVENOUS
  Administered 2014-06-17: 30 mg via INTRAVENOUS

## 2014-06-17 MED ORDER — OXYCODONE-ACETAMINOPHEN 5-325 MG PO TABS
1.0000 | ORAL_TABLET | ORAL | Status: DC | PRN
  Administered 2014-06-17: 1 via ORAL
  Administered 2014-06-18: 2 via ORAL
  Filled 2014-06-17: qty 2
  Filled 2014-06-17: qty 1

## 2014-06-17 MED ORDER — STERILE WATER FOR INJECTION IJ SOLN
INTRAMUSCULAR | Status: AC
  Filled 2014-06-17: qty 10

## 2014-06-17 MED ORDER — LIDOCAINE HCL (CARDIAC) 20 MG/ML IV SOLN
INTRAVENOUS | Status: AC
  Filled 2014-06-17: qty 5

## 2014-06-17 MED ORDER — FENTANYL CITRATE 0.05 MG/ML IJ SOLN
INTRAMUSCULAR | Status: DC | PRN
  Administered 2014-06-17 (×4): 50 ug via INTRAVENOUS

## 2014-06-17 MED ORDER — LACTATED RINGERS IV SOLN
INTRAVENOUS | Status: DC | PRN
  Administered 2014-06-17 (×2): via INTRAVENOUS

## 2014-06-17 MED ORDER — BUPIVACAINE-EPINEPHRINE (PF) 0.5% -1:200000 IJ SOLN
INTRAMUSCULAR | Status: AC
  Filled 2014-06-17: qty 30

## 2014-06-17 MED ORDER — ACETAMINOPHEN 650 MG RE SUPP: 650.0000 mg | Freq: Four times a day (QID) | RECTAL | Status: DC | PRN

## 2014-06-17 MED ORDER — DEXTROSE 5 % IV SOLN
500.0000 mg | Freq: Four times a day (QID) | INTRAVENOUS | Status: DC | PRN
  Filled 2014-06-17: qty 5

## 2014-06-17 MED ORDER — HYDROMORPHONE HCL 1 MG/ML IJ SOLN: 1.0000 mg | INTRAMUSCULAR | Status: DC | PRN

## 2014-06-17 MED ORDER — ROPIVACAINE HCL 5 MG/ML IJ SOLN
INTRAMUSCULAR | Status: DC | PRN
Start: 2014-06-17 — End: 2014-06-17
  Administered 2014-06-17: 25 mL via PERINEURAL

## 2014-06-17 MED ORDER — SUCCINYLCHOLINE CHLORIDE 20 MG/ML IJ SOLN
INTRAMUSCULAR | Status: AC
  Filled 2014-06-17: qty 1

## 2014-06-17 MED ORDER — GLYCOPYRROLATE 0.2 MG/ML IJ SOLN
INTRAMUSCULAR | Status: AC
  Filled 2014-06-17: qty 3

## 2014-06-17 MED ORDER — BISACODYL 5 MG PO TBEC
5.0000 mg | DELAYED_RELEASE_TABLET | Freq: Every day | ORAL | Status: DC | PRN
Start: 2014-06-17 — End: 2014-06-18

## 2014-06-17 MED ORDER — ONDANSETRON HCL 4 MG PO TABS: 4.0000 mg | ORAL_TABLET | Freq: Four times a day (QID) | ORAL | Status: DC | PRN

## 2014-06-17 MED ORDER — IRBESARTAN 75 MG PO TABS
37.5000 mg | ORAL_TABLET | Freq: Every day | ORAL | Status: DC
  Administered 2014-06-17: 37.5 mg via ORAL
  Filled 2014-06-17 (×2): qty 0.5

## 2014-06-17 MED ORDER — HYDROMORPHONE HCL 1 MG/ML IJ SOLN
0.2500 mg | INTRAMUSCULAR | Status: DC | PRN
  Administered 2014-06-17 (×4): 0.5 mg via INTRAVENOUS

## 2014-06-17 MED ORDER — PREDNISONE 10 MG PO TABS
10.0000 mg | ORAL_TABLET | Freq: Every day | ORAL | Status: DC
  Administered 2014-06-18: 10 mg via ORAL
  Filled 2014-06-17 (×2): qty 1

## 2014-06-17 MED ORDER — ROCURONIUM BROMIDE 50 MG/5ML IV SOLN
INTRAVENOUS | Status: AC
  Filled 2014-06-17: qty 1

## 2014-06-17 MED ORDER — ALBUTEROL SULFATE (2.5 MG/3ML) 0.083% IN NEBU
2.5000 mg | INHALATION_SOLUTION | RESPIRATORY_TRACT | Status: DC
  Administered 2014-06-17: 2.5 mg via RESPIRATORY_TRACT
  Filled 2014-06-17: qty 3

## 2014-06-17 MED ORDER — ALLOPURINOL 100 MG PO TABS
100.0000 mg | ORAL_TABLET | Freq: Every day | ORAL | Status: DC
  Administered 2014-06-18: 100 mg via ORAL
  Filled 2014-06-17: qty 1

## 2014-06-17 MED ORDER — OXYCODONE-ACETAMINOPHEN 5-325 MG PO TABS: 1.0000 | ORAL_TABLET | ORAL | Status: DC | PRN

## 2014-06-17 MED ORDER — ALUM & MAG HYDROXIDE-SIMETH 200-200-20 MG/5ML PO SUSP: 30.0000 mL | ORAL | Status: DC | PRN

## 2014-06-17 MED ORDER — FENTANYL CITRATE 0.05 MG/ML IJ SOLN
INTRAMUSCULAR | Status: AC
  Filled 2014-06-17: qty 5

## 2014-06-17 MED ORDER — METHOCARBAMOL 1000 MG/10ML IJ SOLN
500.0000 mg | INTRAMUSCULAR | Status: DC
  Filled 2014-06-17: qty 5

## 2014-06-17 MED ORDER — BUPIVACAINE HCL (PF) 0.5 % IJ SOLN
INTRAMUSCULAR | Status: AC
  Filled 2014-06-17: qty 10

## 2014-06-17 MED ORDER — MIDAZOLAM HCL 5 MG/5ML IJ SOLN
INTRAMUSCULAR | Status: DC | PRN
  Administered 2014-06-17 (×2): 1 mg via INTRAVENOUS

## 2014-06-17 MED ORDER — ROCURONIUM BROMIDE 100 MG/10ML IV SOLN
INTRAVENOUS | Status: DC | PRN
  Administered 2014-06-17: 40 mg via INTRAVENOUS

## 2014-06-17 MED ORDER — PROMETHAZINE HCL 25 MG/ML IJ SOLN: 6.2500 mg | Freq: Four times a day (QID) | INTRAMUSCULAR | Status: DC | PRN

## 2014-06-17 MED ORDER — LIDOCAINE HCL (CARDIAC) 20 MG/ML IV SOLN
INTRAVENOUS | Status: DC | PRN
  Administered 2014-06-17: 80 mg via INTRAVENOUS

## 2014-06-17 MED ORDER — SODIUM CHLORIDE 0.9 % IR SOLN
Status: DC | PRN
  Administered 2014-06-17 (×2): 1000 mL

## 2014-06-17 MED ORDER — ALBUTEROL SULFATE HFA 108 (90 BASE) MCG/ACT IN AERS: 2.0000 | INHALATION_SPRAY | RESPIRATORY_TRACT | Status: DC

## 2014-06-17 MED ORDER — ONDANSETRON HCL 4 MG/2ML IJ SOLN
INTRAMUSCULAR | Status: AC
  Filled 2014-06-17: qty 2

## 2014-06-17 MED ORDER — ONDANSETRON HCL 4 MG/2ML IJ SOLN: 4.0000 mg | Freq: Four times a day (QID) | INTRAMUSCULAR | Status: DC | PRN

## 2014-06-17 MED ORDER — METHOCARBAMOL 500 MG PO TABS: 500.0000 mg | ORAL_TABLET | Freq: Three times a day (TID) | ORAL | Status: DC | PRN

## 2014-06-17 MED ORDER — SODIUM CHLORIDE 0.9 % IV SOLN
INTRAVENOUS | Status: DC
  Administered 2014-06-17: 17:00:00 via INTRAVENOUS

## 2014-06-17 MED ORDER — DEXAMETHASONE SODIUM PHOSPHATE 10 MG/ML IJ SOLN
10.0000 mg | Freq: Four times a day (QID) | INTRAMUSCULAR | Status: AC
  Administered 2014-06-17 (×2): 10 mg via INTRAVENOUS
  Filled 2014-06-17 (×5): qty 1

## 2014-06-17 MED ORDER — ATORVASTATIN CALCIUM 10 MG PO TABS
10.0000 mg | ORAL_TABLET | Freq: Every day | ORAL | Status: DC
  Administered 2014-06-17: 10 mg via ORAL
  Filled 2014-06-17 (×2): qty 1

## 2014-06-17 MED ORDER — BUPIVACAINE LIPOSOME 1.3 % IJ SUSP
INTRAMUSCULAR | Status: DC | PRN
  Administered 2014-06-17: 20 mL

## 2014-06-17 MED ORDER — DIPHENHYDRAMINE HCL 12.5 MG/5ML PO ELIX: 12.5000 mg | ORAL_SOLUTION | ORAL | Status: DC | PRN

## 2014-06-17 MED ORDER — HYDROMORPHONE HCL 1 MG/ML IJ SOLN
INTRAMUSCULAR | Status: AC
  Filled 2014-06-17: qty 1

## 2014-06-17 MED ORDER — FLUTICASONE PROPIONATE HFA 44 MCG/ACT IN AERO
2.0000 | INHALATION_SPRAY | Freq: Two times a day (BID) | RESPIRATORY_TRACT | Status: DC
  Administered 2014-06-17 – 2014-06-18 (×2): 2 via RESPIRATORY_TRACT
  Filled 2014-06-17: qty 10.6

## 2014-06-17 MED ORDER — ISOSORBIDE MONONITRATE ER 30 MG PO TB24
30.0000 mg | ORAL_TABLET | Freq: Every day | ORAL | Status: DC
  Administered 2014-06-18: 30 mg via ORAL
  Filled 2014-06-17: qty 1

## 2014-06-17 MED ORDER — BUPIVACAINE HCL (PF) 0.5 % IJ SOLN
INTRAMUSCULAR | Status: DC | PRN
  Administered 2014-06-17: 20 mL

## 2014-06-17 MED ORDER — DEXAMETHASONE SODIUM PHOSPHATE 4 MG/ML IJ SOLN
INTRAMUSCULAR | Status: DC | PRN
  Administered 2014-06-17: 8 mg via INTRAVENOUS

## 2014-06-17 MED ORDER — ALBUTEROL SULFATE HFA 108 (90 BASE) MCG/ACT IN AERS: 2.0000 | INHALATION_SPRAY | RESPIRATORY_TRACT | Status: DC | PRN

## 2014-06-17 MED ORDER — ONDANSETRON HCL 4 MG/2ML IJ SOLN
INTRAMUSCULAR | Status: DC | PRN
  Administered 2014-06-17: 4 mg via INTRAVENOUS

## 2014-06-17 MED ORDER — EPHEDRINE SULFATE 50 MG/ML IJ SOLN
INTRAMUSCULAR | Status: AC
  Filled 2014-06-17: qty 1

## 2014-06-17 MED ORDER — NEOSTIGMINE METHYLSULFATE 10 MG/10ML IV SOLN
INTRAVENOUS | Status: DC | PRN
  Administered 2014-06-17: 4 mg via INTRAVENOUS

## 2014-06-17 MED ORDER — ASPIRIN EC 325 MG PO TBEC: 325.0000 mg | DELAYED_RELEASE_TABLET | Freq: Two times a day (BID) | ORAL | Status: DC

## 2014-06-17 MED ORDER — NEOSTIGMINE METHYLSULFATE 10 MG/10ML IV SOLN
INTRAVENOUS | Status: AC
  Filled 2014-06-17: qty 3

## 2014-06-17 MED ORDER — CEFAZOLIN SODIUM-DEXTROSE 2-3 GM-% IV SOLR
INTRAVENOUS | Status: AC
  Administered 2014-06-17: 2 g via INTRAVENOUS
  Filled 2014-06-17: qty 50

## 2014-06-17 MED ORDER — DEXAMETHASONE SODIUM PHOSPHATE 4 MG/ML IJ SOLN
INTRAMUSCULAR | Status: AC
  Filled 2014-06-17: qty 2

## 2014-06-17 MED ORDER — POLYETHYLENE GLYCOL 3350 17 G PO PACK: 17.0000 g | PACK | Freq: Every day | ORAL | Status: DC | PRN

## 2014-06-17 MED ORDER — CEFAZOLIN SODIUM-DEXTROSE 2-3 GM-% IV SOLR
2.0000 g | Freq: Four times a day (QID) | INTRAVENOUS | Status: AC
  Administered 2014-06-17 (×2): 2 g via INTRAVENOUS
  Filled 2014-06-17 (×3): qty 50

## 2014-06-17 MED ORDER — 0.9 % SODIUM CHLORIDE (POUR BTL) OPTIME
TOPICAL | Status: DC | PRN
  Administered 2014-06-17: 1000 mL

## 2014-06-17 MED ORDER — MIDAZOLAM HCL 2 MG/2ML IJ SOLN
INTRAMUSCULAR | Status: AC
  Filled 2014-06-17: qty 2

## 2014-06-17 MED ORDER — ALBUTEROL SULFATE (2.5 MG/3ML) 0.083% IN NEBU: 2.5000 mg | INHALATION_SOLUTION | RESPIRATORY_TRACT | Status: DC | PRN

## 2014-06-17 MED ORDER — SALMETEROL XINAFOATE 50 MCG/DOSE IN AEPB
1.0000 | INHALATION_SPRAY | Freq: Two times a day (BID) | RESPIRATORY_TRACT | Status: DC
  Administered 2014-06-17 – 2014-06-18 (×2): 1 via RESPIRATORY_TRACT
  Filled 2014-06-17: qty 0

## 2014-06-17 MED ORDER — PROMETHAZINE HCL 25 MG/ML IJ SOLN: 6.2500 mg | INTRAMUSCULAR | Status: DC | PRN

## 2014-06-17 MED ORDER — ASPIRIN EC 325 MG PO TBEC
325.0000 mg | DELAYED_RELEASE_TABLET | Freq: Two times a day (BID) | ORAL | Status: DC
Start: 2014-06-17 — End: 2014-06-18
  Administered 2014-06-17 – 2014-06-18 (×2): 325 mg via ORAL
  Filled 2014-06-17 (×4): qty 1

## 2014-06-17 MED ORDER — DOCUSATE SODIUM 100 MG PO CAPS
100.0000 mg | ORAL_CAPSULE | Freq: Two times a day (BID) | ORAL | Status: DC
  Administered 2014-06-17 – 2014-06-18 (×3): 100 mg via ORAL
  Filled 2014-06-17 (×3): qty 1

## 2014-06-17 MED ORDER — METHOCARBAMOL 500 MG PO TABS
500.0000 mg | ORAL_TABLET | Freq: Four times a day (QID) | ORAL | Status: DC | PRN
  Administered 2014-06-17 – 2014-06-18 (×2): 500 mg via ORAL
  Filled 2014-06-17 (×3): qty 1

## 2014-06-17 MED ORDER — EPHEDRINE SULFATE 50 MG/ML IJ SOLN
INTRAMUSCULAR | Status: DC | PRN
  Administered 2014-06-17: 5 mg via INTRAVENOUS

## 2014-06-17 MED ORDER — TRANEXAMIC ACID 100 MG/ML IV SOLN
1000.0000 mg | INTRAVENOUS | Status: AC
  Administered 2014-06-17: 1000 mg via INTRAVENOUS
  Filled 2014-06-17: qty 10

## 2014-06-17 MED ORDER — FLEET ENEMA 7-19 GM/118ML RE ENEM: 1.0000 | ENEMA | Freq: Once | RECTAL | Status: AC | PRN

## 2014-06-17 SURGICAL SUPPLY — 70 items
APL SKNCLS STERI-STRIP NONHPOA (GAUZE/BANDAGES/DRESSINGS) ×1
BANDAGE ELASTIC 6 VELCRO ST LF (GAUZE/BANDAGES/DRESSINGS) ×1 IMPLANT
BANDAGE ESMARK 6X9 LF (GAUZE/BANDAGES/DRESSINGS) ×1 IMPLANT
BENZOIN TINCTURE PRP APPL 2/3 (GAUZE/BANDAGES/DRESSINGS) ×2 IMPLANT
BLADE SAGITTAL 25.0X1.19X90 (BLADE) ×2 IMPLANT
BLADE SAW SAG 90X13X1.27 (BLADE) ×2 IMPLANT
BNDG CMPR 9X6 STRL LF SNTH (GAUZE/BANDAGES/DRESSINGS) ×1
BNDG ESMARK 6X9 LF (GAUZE/BANDAGES/DRESSINGS) ×2
BOWL SMART MIX CTS (DISPOSABLE) ×2 IMPLANT
CAP KNEE TOTAL 3 SIGMA ×1 IMPLANT
CEMENT HV SMART SET (Cement) ×4 IMPLANT
CLSR STERI-STRIP ANTIMIC 1/2X4 (GAUZE/BANDAGES/DRESSINGS) ×1 IMPLANT
COVER SURGICAL LIGHT HANDLE (MISCELLANEOUS) ×2 IMPLANT
CUFF TOURNIQUET SINGLE 34IN LL (TOURNIQUET CUFF) ×2 IMPLANT
DRAPE EXTREMITY T 121X128X90 (DRAPE) ×2 IMPLANT
DRAPE IMP U-DRAPE 54X76 (DRAPES) ×2 IMPLANT
DRAPE U-SHAPE 47X51 STRL (DRAPES) ×2 IMPLANT
DRSG MEPILEX BORDER 4X8 (GAUZE/BANDAGES/DRESSINGS) ×1 IMPLANT
DRSG PAD ABDOMINAL 8X10 ST (GAUZE/BANDAGES/DRESSINGS) ×2 IMPLANT
DURAPREP 26ML APPLICATOR (WOUND CARE) ×2 IMPLANT
ELECT REM PT RETURN 9FT ADLT (ELECTROSURGICAL) ×2
ELECTRODE REM PT RTRN 9FT ADLT (ELECTROSURGICAL) ×1 IMPLANT
EVACUATOR 1/8 PVC DRAIN (DRAIN) ×2 IMPLANT
FACESHIELD WRAPAROUND (MASK) ×2 IMPLANT
FACESHIELD WRAPAROUND OR TEAM (MASK) ×1 IMPLANT
GAUZE SPONGE 4X4 12PLY STRL (GAUZE/BANDAGES/DRESSINGS) ×2 IMPLANT
GAUZE XEROFORM 5X9 LF (GAUZE/BANDAGES/DRESSINGS) ×2 IMPLANT
GLOVE BIO SURGEON STRL SZ7.5 (GLOVE) ×1 IMPLANT
GLOVE BIOGEL PI IND STRL 7.0 (GLOVE) IMPLANT
GLOVE BIOGEL PI IND STRL 7.5 (GLOVE) IMPLANT
GLOVE BIOGEL PI IND STRL 8 (GLOVE) ×2 IMPLANT
GLOVE BIOGEL PI INDICATOR 7.0 (GLOVE) ×1
GLOVE BIOGEL PI INDICATOR 7.5 (GLOVE) ×1
GLOVE BIOGEL PI INDICATOR 8 (GLOVE) ×2
GLOVE ECLIPSE 7.5 STRL STRAW (GLOVE) ×4 IMPLANT
GOWN STRL REUS W/ TWL LRG LVL3 (GOWN DISPOSABLE) ×1 IMPLANT
GOWN STRL REUS W/ TWL XL LVL3 (GOWN DISPOSABLE) ×2 IMPLANT
GOWN STRL REUS W/TWL LRG LVL3 (GOWN DISPOSABLE) ×2
GOWN STRL REUS W/TWL XL LVL3 (GOWN DISPOSABLE) ×4
HANDPIECE INTERPULSE COAX TIP (DISPOSABLE) ×2
HOOD PEEL AWAY FACE SHEILD DIS (HOOD) ×6 IMPLANT
IMMOBILIZER KNEE 22 UNIV (SOFTGOODS) ×2 IMPLANT
KIT BASIN OR (CUSTOM PROCEDURE TRAY) ×2 IMPLANT
KIT ROOM TURNOVER OR (KITS) ×2 IMPLANT
MANIFOLD NEPTUNE II (INSTRUMENTS) ×2 IMPLANT
NDL SPNL 20GX3.5 QUINCKE YW (NEEDLE) IMPLANT
NDL SPNL 22GX3.5 QUINCKE BK (NEEDLE) ×1 IMPLANT
NEEDLE SPNL 20GX3.5 QUINCKE YW (NEEDLE) ×2 IMPLANT
NEEDLE SPNL 22GX3.5 QUINCKE BK (NEEDLE) ×2 IMPLANT
NS IRRIG 1000ML POUR BTL (IV SOLUTION) ×2 IMPLANT
PACK TOTAL JOINT (CUSTOM PROCEDURE TRAY) ×2 IMPLANT
PACK UNIVERSAL I (CUSTOM PROCEDURE TRAY) ×2 IMPLANT
PAD ARMBOARD 7.5X6 YLW CONV (MISCELLANEOUS) ×4 IMPLANT
PAD CAST 4YDX4 CTTN HI CHSV (CAST SUPPLIES) ×1 IMPLANT
PADDING CAST ABS 4INX4YD NS (CAST SUPPLIES) ×1
PADDING CAST ABS COTTON 4X4 ST (CAST SUPPLIES) IMPLANT
PADDING CAST COTTON 4X4 STRL (CAST SUPPLIES) ×2
SET HNDPC FAN SPRY TIP SCT (DISPOSABLE) ×1 IMPLANT
SPONGE GAUZE 4X4 12PLY STER LF (GAUZE/BANDAGES/DRESSINGS) ×1 IMPLANT
STRIP CLOSURE SKIN 1/2X4 (GAUZE/BANDAGES/DRESSINGS) ×2 IMPLANT
SUCTION FRAZIER TIP 10 FR DISP (SUCTIONS) ×2 IMPLANT
SUT MNCRL AB 3-0 PS2 18 (SUTURE) ×1 IMPLANT
SUT VIC AB 0 CTB1 27 (SUTURE) ×4 IMPLANT
SUT VIC AB 1 CT1 27 (SUTURE) ×4
SUT VIC AB 1 CT1 27XBRD ANBCTR (SUTURE) ×2 IMPLANT
SUT VIC AB 2-0 CTB1 (SUTURE) ×4 IMPLANT
SYR 50ML LL SCALE MARK (SYRINGE) ×2 IMPLANT
SYR CONTROL 10ML LL (SYRINGE) ×1 IMPLANT
TOWEL OR 17X24 6PK STRL BLUE (TOWEL DISPOSABLE) ×2 IMPLANT
TOWEL OR 17X26 10 PK STRL BLUE (TOWEL DISPOSABLE) ×2 IMPLANT

## 2014-06-17 NOTE — Progress Notes (Signed)
Utilization review completed.  

## 2014-06-17 NOTE — Progress Notes (Signed)
Orthopedic Tech Progress Note Patient Details:  Levi [name] 1946-05-08 [id number] CPM applied to RLE with appropriate settings. OHF applied to bed.  CPM Right Knee CPM Right Knee: On Right Knee Flexion (Degrees): 90 Right Knee Extension (Degrees): 0   Asia R Thompson 06/17/2014, 10:14 AM

## 2014-06-17 NOTE — H&P (Signed)
TOTAL KNEE ADMISSION H&P  Patient is being admitted for right total knee arthroplasty.  Subjective:  Chief Complaint:right knee pain.  HPI: Levi [name], 69 y.o. male, has a history of pain and functional disability in the right knee due to arthritis and has failed non-surgical conservative treatments for greater than 12 weeks to includeNSAID's and/or analgesics, corticosteriod injections, viscosupplementation injections, use of assistive devices, weight reduction as appropriate and activity modification.  Onset of symptoms was gradual, starting 5 years ago with gradually worsening course since that time. The patient noted prior procedures on the knee to include  arthroscopy and menisectomy on the right knee(s).  Patient currently rates pain in the right knee(s) at 8 out of 10 with activity. Patient has night pain, worsening of pain with activity and weight bearing, pain that interferes with activities of daily living, pain with passive range of motion, crepitus and joint swelling.  Patient has evidence of subchondral cysts, subchondral sclerosis, periarticular osteophytes and joint space narrowing by imaging studies. This patient has had failure of all conservative care. There is no active infection.  Patient Active Problem List   Diagnosis Date Noted  . Thrush of mouth and esophagus 04/03/2014  . Palpitation 03/28/2014  . Acute bronchitis 08/10/2013  . Pseudoarthrosis of lumbar spine 06/15/2013  . Hyperlipidemia   . Personal history of colonic adenomas 02/09/2013  . Atrial fibrillation 05/11/2012  . Osteoarthritis of left knee 01/10/2012  . Allergic rhinitis due to pollen 07/23/2010  . Chronic obstructive airway disease with asthma 03/06/2009   Past Medical History  Diagnosis Date  . Asthma   . ALLERGIC RHINITIS   . Hyperlipidemia     takes Lipitor daily  . Agent orange exposure 1970's    takes Imdur,Diltiazem, and Atacand daily  . Joint pain   . Joint swelling   . Elevated uric  acid in blood     takes Allopurinol daily  . Bruises easily     d/t prednisone daily  . GERD (gastroesophageal reflux disease)     takes Protonix daily  . Diverticulitis   . Enlarged prostate     but not on any meds  . History of pneumonia     a. 2010  . Chronic bronchitis     "used to get it q yr; last time ?2008" (04/28/2012)  . H/O hiatal hernia   . Degenerative disk disease     "qwhere" (04/28/2012)  . Personal history of colonic adenomas 02/09/2013  . PAF (paroxysmal atrial fibrillation)     a. Dx 04/2012, CHA2DS2VASc = 1 (age);  b. 04/2012 Echo: EF 40-50%, mild MR.  [name] Dysrhythmia     afib  . COPD (chronic obstructive pulmonary disease)     agent orange exposure    Past Surgical History  Procedure Laterality Date  . Knee surgery    . Gallbladder surgery    . Total hip arthroplasty  2011    "left" (04/28/2012)  . Lateral / posterior combined fusion lumbar spine  2012  . Joint replacement    . Total knee arthroplasty  01/10/2012    Procedure: TOTAL KNEE ARTHROPLASTY;  Surgeon: [name], MD;  Location: [facility];  Service: Orthopedics;;  left total knee arthroplasty  . Cardiac catheterization  11/2008    [name] - 20% calcified non flow limiting left main, 50% EF apical hypokinesis  . Knee arthroscopy      "had them both scoped a bunch of times" (12/07/27/2011)  . Colonoscopy    .  Esophagogastroduodenoscopy    . Reverse shoulder arthroplasty  04/28/2012    left  . Tonsillectomy and adenoidectomy  1954  . Cholecystectomy  1990's  . Reverse shoulder arthroplasty  04/28/2012    Procedure: REVERSE SHOULDER ARTHROPLASTY;  Surgeon: [name], MD;  Location: [facility];  Service: Orthopedics;  Laterality: Left;  Left shouder reverse total shoulder arthroplasty  . Shoulder surgery    . Anterior cervical decomp/discectomy fusion N/A 07/21/2012    Procedure: ANTERIOR CERVICAL DECOMPRESSION/DISCECTOMY FUSION 2 LEVELS;  Surgeon: [name], MD;  Location: [facility];   Service: Neurosurgery;  Laterality: N/A;  C4-5 C5-6 Anterior cervical decompression/diskectomy/fusion  . Eye surgery  2011    "bilaterally; steroidal encapsulation" (04/28/2012)    Prescriptions prior to admission  Medication Sig Dispense Refill Last Dose  . allopurinol (ZYLOPRIM) 100 MG tablet Take 100 mg by mouth daily.     06/17/2014 at 0430  . atorvastatin (LIPITOR) 10 MG tablet TAKE 1 TABLET (10 MG TOTAL) BY MOUTH DAILY. 30 tablet 3 06/16/2014 at Unknown time  . beclomethasone (QVAR) 80 MCG/ACT inhaler Inhale 2 puffs into the lungs 2 (two) times daily. 1 Inhaler 3 06/17/2014 at 0430  . candesartan (ATACAND) 4 MG tablet Take 0.5 tablets (2 mg total) by mouth daily. (Patient taking differently: Take 2 mg by mouth daily with supper. ) 30 tablet 6 06/16/2014 at 2200  . cyclobenzaprine (FLEXERIL) 10 MG tablet Take 10 mg by mouth 2 (two) times daily as needed for muscle spasms.   5 Past Week at Unknown time  . diltiazem (CARDIZEM CD) 180 MG 24 hr capsule TAKE 1 CAPSULE (180 MG TOTAL) BY MOUTH DAILY. 30 capsule 3 06/17/2014 at 0430  . FORADIL AEROLIZER 12 MCG capsule for inhaler PLACE 1 CAPSULE (12 MCG TOTAL) INTO INHALER AND INHALE 2 (TWO) TIMES DAILY. 60 capsule 11 06/16/2014 at 2200  . guaiFENesin (MUCINEX) 600 MG 12 hr tablet Take 600 mg by mouth 2 (two) times daily as needed for cough or to loosen phlegm.    Past Week at Unknown time  . isosorbide mononitrate (IMDUR) 30 MG 24 hr tablet TAKE 1 TABLET (30 MG TOTAL) BY MOUTH DAILY. 30 tablet 3 06/17/2014 at 0430  . pantoprazole (PROTONIX) 40 MG tablet TAKE 1 TABLET BY MOUTH TWICE A DAY (Patient taking differently: Take 40 mg by mouth 2 (two) times daily. ) 60 tablet 11 06/17/2014 at 0430  . predniSONE (DELTASONE) 10 MG tablet Take 10 mg by mouth daily. continuous   06/17/2014 at 0430  . amoxicillin (AMOXIL) 500 MG capsule Take 2,000 mg by mouth See admin instructions. Take 4 capsules (2000 mg) 1 hour prior to dental appointment  1 More than a month at Unknown  time  . PROVENTIL HFA 108 (90 BASE) MCG/ACT inhaler INHALE 2 PUFFS INTO THE LUNGS EVERY 4 (FOUR) HOURS AS NEEDED FOR WHEEZING. (Patient not taking: Reported on 06/06/2014) 6.7 each 3 Not Taking at Unknown time  . PROVENTIL HFA 108 (90 BASE) MCG/ACT inhaler INHALE 2 PUFFS INTO THE LUNGS EVERY 4 (FOUR) HOURS AS NEEDED FOR WHEEZING. 6.7 each 3 More than a month at Unknown time   Allergies  Allergen Reactions  . Morphine And Related Other (See Comments)    Light headed and sweaty; "my heart races" (04/28/2012)  . Levaquin [Levofloxacin] Itching    History  Substance Use Topics  . Smoking status: Never Smoker   . Smokeless tobacco: Never Used  . Alcohol Use: No    Family History  Problem Relation Age of Onset  . Heart disease Father     Died 74, MI  . Prostate cancer Brother      ROS ROS: I have reviewed the patient's review of systems thoroughly and there are no positive responses as relates to the HPI.  Objective:  Physical Exam  Vital signs in last 24 hours: Temp:  [97.7 F (36.5 C)] 97.7 F (36.5 C) (01/22 0611) Pulse Rate:  [50] 50 (01/22 0611) Resp:  [18] 18 (01/22 0611) SpO2:  [100 %] 100 % (01/22 0611) Weight:  [158 lb 1.6 oz (71.714 kg)] 158 lb 1.6 oz (71.714 kg) (01/22 3825) Well-developed well-nourished patient in no acute distress. Alert and oriented x3 HEENT:within normal limits Cardiac: Regular rate and rhythm Pulmonary: Lungs clear to auscultation Abdomen: Soft and nontender.  Normal active bowel sounds  Musculoskeletal: (Right knee: Painful range of motion/positive lateral jointline tenderness/no instability/range of motion 0-110 Labs: Recent Results (from the past 2160 hour(s))  Surgical pcr screen     Status: Abnormal   Collection Time: 06/07/14  8:25 AM  Result Value Ref Range   MRSA, PCR NEGATIVE NEGATIVE   Staphylococcus aureus POSITIVE (A) NEGATIVE    Comment:        The Xpert SA Assay (FDA approved for NASAL specimens in patients over 21 years  of age), is one component of a comprehensive surveillance program.  Test performance has been validated by [employer] for patients greater than or equal to 36 year old. It is not intended to diagnose infection nor to guide or monitor treatment.   Basic metabolic panel     Status: Abnormal   Collection Time: 06/07/14  8:25 AM  Result Value Ref Range   Sodium 139 135 - 145 mmol/L    Comment: Please note change in reference range.   Potassium 4.0 3.5 - 5.1 mmol/L    Comment: Please note change in reference range.   Chloride 105 96 - 112 mEq/L   CO2 24 19 - 32 mmol/L   Glucose, Bld 118 (H) 70 - 99 mg/dL   BUN 24 (H) 6 - 23 mg/dL   Creatinine, Ser 1.02 0.50 - 1.35 mg/dL   Calcium 9.9 8.4 - 10.5 mg/dL   GFR calc non Af Amer 74 (L) >90 mL/min   GFR calc Af Amer 86 (L) >90 mL/min    Comment: (NOTE) The eGFR has been calculated using the CKD EPI equation. This calculation has not been validated in all clinical situations. eGFR's persistently <90 mL/min signify possible Chronic Kidney Disease.    Anion gap 10 5 - 15  CBC     Status: Abnormal   Collection Time: 06/07/14  8:25 AM  Result Value Ref Range   WBC 6.9 4.0 - 10.5 K/uL   RBC 4.37 4.22 - 5.81 MIL/uL   Hemoglobin 12.5 (L) 13.0 - 17.0 g/dL   HCT 38.3 (L) 39.0 - 52.0 %   MCV 87.6 78.0 - 100.0 fL   MCH 28.6 26.0 - 34.0 pg   MCHC 32.6 30.0 - 36.0 g/dL   RDW 16.9 (H) 11.5 - 15.5 %   Platelets 246 150 - 400 K/uL  Protime-INR     Status: None   Collection Time: 06/07/14  8:25 AM  Result Value Ref Range   Prothrombin Time 12.9 11.6 - 15.2 seconds   INR 0.97 0.00 - 1.49  PTT     Status: None   Collection Time: 06/07/14  8:25 AM  Result Value Ref Range  aPTT 24 24 - 37 seconds  Type and screen     Status: None   Collection Time: 06/07/14  8:25 AM  Result Value Ref Range   ABO/RH(D) O POS    Antibody Screen NEG    Sample Expiration 06/21/2014     Estimated body mass index is 20.29 kg/(m^2) as calculated from the  following:   Height as of 06/07/14: _0  (1.88 m).   Weight as of this encounter: 158 lb 1.6 oz (71.714 kg).   Imaging Review Plain radiographs demonstrate severe degenerative joint disease of the right knee(s). The overall alignment ismild valgus. The bone quality appears to be good for age and reported activity level.  Assessment/Plan:  End stage arthritis, right knee   The patient history, physical examination, clinical judgment of the provider and imaging studies are consistent with end stage degenerative joint disease of the right knee(s) and total knee arthroplasty is deemed medically necessary. The treatment options including medical management, injection therapy arthroscopy and arthroplasty were discussed at length. The risks and benefits of total knee arthroplasty were presented and reviewed. The risks due to aseptic loosening, infection, stiffness, patella tracking problems, thromboembolic complications and other imponderables were discussed. The patient acknowledged the explanation, agreed to proceed with the plan and consent was signed. Patient is being admitted for inpatient treatment for surgery, pain control, PT, OT, prophylactic antibiotics, VTE prophylaxis, progressive ambulation and ADL's and discharge planning. The patient is planning to be discharged home with home health services

## 2014-06-17 NOTE — Discharge Instructions (Signed)
Total Knee Replacement, Care After °Refer to this sheet in the next few weeks. These instructions provide you with information on caring for yourself after your procedure. Your health care provider also may give you specific instructions. Your treatment has been planned according to the most current medical practices, but problems sometimes occur. Call your health care provider if you have any problems or questions after your procedure. °HOME CARE INSTRUCTIONS  °· See a physical therapist as directed by your health care provider. °· Take medicines only as directed by your health care provider. °· Avoid lifting or driving until you are instructed otherwise. °· If you have been sent home with a continuous passive motion machine, use it as directed by your health care provider. °SEEK MEDICAL CARE IF: °· You have difficulty breathing. °· You have drainage, redness, swelling, or pain at your incision site. °· You have a bad smell coming from your incision site. °· You have persistent bleeding from your incision site. °· Your incision breaks open after sutures (stitches) or staples have been removed. °· You have a fever. °SEEK IMMEDIATE MEDICAL CARE IF:  °· You have a rash. °· You have pain or swelling in your calf or thigh. °· You have shortness of breath or chest pain. °· Your range of motion in your knee is decreasing rather than increasing. °MAKE SURE YOU:  °· Understand these instructions. °· Will watch your condition. °· Will get help right away if you are not doing well or get worse. °Document Released: 11/30/2004 Document Revised: 09/27/2013 Document Reviewed: 07/02/2011 °ExitCare® Patient Information ©2015 [employer]. This information is not intended to replace advice given to you by your health care provider. Make sure you discuss any questions you have with your health care provider. ° °

## 2014-06-17 NOTE — Brief Op Note (Addendum)
06/17/2014  11:54 AM  PATIENT:  Levi [name]  69 y.o. male  PRE-OPERATIVE DIAGNOSIS:  Degenerative joint disease  POST-OPERATIVE DIAGNOSIS:  Degenerative joint disease  PROCEDURE:  Procedure(s): TOTAL KNEE ARTHROPLASTY (Right)  SURGEON:  Surgeon(s) and Role:    * [name], MD - Primary  PHYSICIAN ASSISTANT:   ASSISTANTS: bethune   ANESTHESIA:   general  EBL:  Total I/O In: 1450 [I.V.:1450] Out: 275 [Drains:125; Blood:150]  BLOOD ADMINISTERED:none  DRAINS: 1 drain r knee  LOCAL MEDICATIONS USED:  NONE  SPECIMEN: none  DISPOSITION OF SPECIMEN:  N/A  COUNTS:  YES  TOURNIQUET:   Total Tourniquet Time Documented: Thigh (Right) - 66 minutes Total: Thigh (Right) - 66 minutes   DICTATION: .Other Dictation: Dictation Number mised it  PLAN OF CARE: Admit to inpatient   PATIENT DISPOSITION:  PACU - hemodynamically stable.   Delay start of Pharmacological VTE agent (>24hrs) due to surgical blood loss or risk of bleeding: no

## 2014-06-17 NOTE — Transfer of Care (Signed)
Immediate Anesthesia Transfer of Care Note  Patient: Levi [name]  Procedure(s) Performed: Procedure(s): TOTAL KNEE ARTHROPLASTY (Right)  Patient Location: PACU  Anesthesia Type:General and Regional  Level of Consciousness: awake, alert  and oriented  Airway & Oxygen Therapy: Patient Spontanous Breathing and Patient connected to nasal cannula oxygen  Post-op Assessment: Report given to PACU RN, Post -op Vital signs reviewed and stable and Patient moving all extremities X 4  Post vital signs: Reviewed and stable  Complications: No apparent anesthesia complications

## 2014-06-17 NOTE — Anesthesia Preprocedure Evaluation (Addendum)
Anesthesia Evaluation  Patient identified by MRN, date of birth, ID band Patient awake    Reviewed: Allergy & Precautions, NPO status , Patient's Chart, lab work & pertinent test results  Airway Mallampati: II  TM Distance: <3 FB Neck ROM: Full    Dental no notable dental hx. (+) Dental Advisory Given, Teeth Intact   Pulmonary neg pulmonary ROS, asthma , COPD COPD inhaler,  breath sounds clear to auscultation  Pulmonary exam normal       Cardiovascular + dysrhythmias Atrial Fibrillation Rhythm:Regular Rate:Normal     Neuro/Psych negative neurological ROS  negative psych ROS   GI/Hepatic Neg liver ROS, hiatal hernia, GERD-  Medicated,  Endo/Other  negative endocrine ROS  Renal/GU negative Renal ROS  negative genitourinary   Musculoskeletal negative musculoskeletal ROS (+) Arthritis -,   Abdominal   Peds negative pediatric ROS (+)  Hematology negative hematology ROS (+)   Anesthesia Other Findings   Reproductive/Obstetrics negative OB ROS                           Anesthesia Physical Anesthesia Plan  ASA: III  Anesthesia Plan: General and Regional   Post-op Pain Management:    Induction: Intravenous  Airway Management Planned: Oral ETT  Additional Equipment:   Intra-op Plan:   Post-operative Plan: Extubation in OR  Informed Consent: I have reviewed the patients History and Physical, chart, labs and discussed the procedure including the risks, benefits and alternatives for the proposed anesthesia with the patient or authorized representative who has indicated his/her understanding and acceptance.   Dental advisory given  Plan Discussed with: CRNA and Surgeon  Anesthesia Plan Comments:       Anesthesia Quick Evaluation

## 2014-06-17 NOTE — Anesthesia Procedure Notes (Addendum)
Anesthesia Regional Block:  Femoral nerve block  Pre-Anesthetic Checklist: ,, timeout performed, Correct Patient, Correct Site, Correct Laterality, Correct Procedure, Correct Position, site marked, Risks and benefits discussed,  Surgical consent,  Pre-op evaluation,  At surgeon's request and post-op pain management  Laterality: Right  Prep: chloraprep       Needles:  Injection technique: Single-shot  Needle Type: Echogenic Stimulator Needle     Needle Length: 9cm 9 cm Needle Gauge: 21 and 21 G    Additional Needles:  Procedures: ultrasound guided (picture in chart) Femoral nerve block Narrative:  Injection made incrementally with aspirations every 5 mL.  Performed by: Personally   Additional Notes: Patient tolerated the procedure well without complications   Procedure Name: Intubation Date/Time: 06/17/2014 7:30 AM Performed by: Garrison Columbus T Pre-anesthesia Checklist: Patient identified, Emergency Drugs available, Suction available and Patient being monitored Patient Re-evaluated:Patient Re-evaluated prior to inductionOxygen Delivery Method: Circle system utilized Preoxygenation: Pre-oxygenation with 100% oxygen Intubation Type: IV induction Ventilation: Mask ventilation without difficulty and Oral airway inserted - appropriate to patient size Laryngoscope Size: Miller and 2 Grade View: Grade II Tube type: Oral Tube size: 7.5 mm Number of attempts: 1 Airway Equipment and Method: Stylet and Oral airway Placement Confirmation: ETT inserted through vocal cords under direct vision,  positive ETCO2 and breath sounds checked- equal and bilateral Secured at: 23 cm Tube secured with: Tape Dental Injury: Teeth and Oropharynx as per pre-operative assessment

## 2014-06-17 NOTE — Plan of Care (Signed)
Problem: Consults Goal: Diagnosis- Total Joint Replacement Primary Total Knee Right     

## 2014-06-17 NOTE — Op Note (Signed)
NAME:  Levi [name], Levi [name] NO.:  [id number]  MEDICAL RECORD NO.:  [id number]  LOCATION:  [id number]                        FACILITY:  [facility]  PHYSICIAN:  [name], M.D.   DATE OF BIRTH:  09/12/1945  DATE OF PROCEDURE:  06/17/2014 DATE OF DISCHARGE:                              OPERATIVE REPORT   PREOPERATIVE DIAGNOSIS:  End-stage degenerative joint disease right knee with limited motion.  POSTOPERATIVE DIAGNOSIS:  End-stage degenerative joint disease right knee with limited motion.  PROCEDURE:  Right total knee replacement with a Sigma system, size 4 femur, size 5 tibia, 12.5 mm bridging bearing, and a 41 mm all polyethylene patella.  SURGEON:  [name], M.D.  ASSISTANT:  Gary Fleet, PA-C  ANESTHESIA:  General.  BRIEF HISTORY:  Levi [name] is a 69 year old male with a long history of significant complaints of right knee pain.  He had been treated conservatively for many years.  He had severe bone-on-bone changes, particularly in the lateral compartment.  He is having night pain and light activity pain, and after failure of all conservative care, he was taken to the operating room for right total knee replacement. Preoperatively, he only had 90 degrees of flexion and he understood the difficulties of improving that postoperatively.  DESCRIPTION OF PROCEDURE:  The patient was taken to the operating room and after adequate level of anesthesia was obtained with general anesthetic, the patient was placed supine on the operating table.  The right leg was prepped and draped in usual sterile fashion.  Following this, the leg was exsanguinated.  Blood pressure tourniquet inflated to 350 mmHg.  Following this, a midline incision was made and a subcutaneous tissue was dissected down the level of extensor mechanism and a medial parapatellar arthrotomy was undertaken.  Once this was done, medial and lateral meniscus was removed, retropatellar fat pad, and  synovium in the anterior aspect of the femur and the anterior and posterior cruciates, tibia was exposed and cut perpendicular to its long axis.  Attention was then turned to the femur, where an intramedullary pilot hole was drilled.  An intramedullary rod was then placed and a 5- degree valgus alignment jig was used and 10 mm of distal femur was resected.  Following this, attention was turned towards putting a spacer block, and he came easily into full extension.  Attention turned to the tibia which was sized to a 4. Anterior and posterior cuts were made, chamfers and box.  Attention was turned to the tibia, where it was drilled and keeled and then the trial components were put into place. The 10 mm poly was placed to full extension, maybe a little bit hyper. At that point, we felt like 12.5 might be right, but little bit of releasing of the IT band laterally to loosen up the lateral compartment did some posterior releasing and then certainly 25.4 became the appropriate poly size.  Attention turned to the patella, cut down to a level of 14 mm and lugs were drilled for 41 mm all polyethylene patella. Following this, the trial components removed.  The knee was copiously and thoroughly lavaged, suctioned dry.  The final components were then cemented  into place, size 4 femur, size 5 tibia, 12.5 mm bridging bearing trial was placed and a 41 all poly patella placed and held with a clamp.  Once that was done, the cement was allowed to harden.  All excess bone cement was removed and attention was turned towards letting down the tourniquet after the bone cement had completely hardened. Bleeding controlled with electrocautery.  We placed a medium Hemovac drain.  We took out the trial poly, put the final poly in place and then closed the fascia with 1 Vicryl running, skin with 0 and 2-0 Vicryl and 3-0 Monocryl subcuticular.  Benzoin and Steri-Strips were applied. Sterile compressive dressing was  applied and the patient was taken to the recovery room, where he was noted to be in satisfactory condition. Estimated blood loss for the procedure was minimal.     [name], M.D.     Corliss Skains  D:  06/17/2014  T:  06/17/2014  Job:  [id number]

## 2014-06-17 NOTE — Anesthesia Postprocedure Evaluation (Signed)
  Anesthesia Post-op Note  Patient: Levi [name]  Procedure(s) Performed: Procedure(s) (LRB): TOTAL KNEE ARTHROPLASTY (Right)  Patient Location: PACU  Anesthesia Type: GA combined with regional for post-op pain  Level of Consciousness: awake and alert   Airway and Oxygen Therapy: Patient Spontanous Breathing  Post-op Pain: mild  Post-op Assessment: Post-op Vital signs reviewed, Patient's Cardiovascular Status Stable, Respiratory Function Stable, Patent Airway and No signs of Nausea or vomiting  Last Vitals:  Filed Vitals:   06/17/14 1009  BP: 131/67  Pulse: 55  Temp:   Resp: 13    Post-op Vital Signs: stable   Complications: No apparent anesthesia complications

## 2014-06-17 NOTE — Progress Notes (Signed)
Physical Therapy Evaluation Patient Details Name: Levi [name] MRN: [id number] DOB: September 17, 1945 Today's Date: 06/17/2014   History of Present Illness  Patient is a 69 yo male admitted 06/17/14 now s/p Rt TKA.  PMH:  Asthma, Agent Orange exposure, HLD, DDD, PAF, dysrhythmia, COPD, Lt THA '11, lumbar fusion '12, Lt TKA '13, reverse TSA '13, ACDF '14  Clinical Impression  Patient presents with problems listed below.  Will benefit from acute PT to maximize independence prior to discharge home with wife.  Should progress well with PT.    Follow Up Recommendations Home health PT;Supervision/Assistance - 24 hour    Equipment Recommendations  None recommended by PT    Recommendations for Other Services       Precautions / Restrictions Precautions Precautions: Knee;Fall Precaution Booklet Issued: Yes (comment) Precaution Comments: Reviewed with patient. Required Braces or Orthoses: Knee Immobilizer - Right Knee Immobilizer - Right: On when out of bed or walking Restrictions Weight Bearing Restrictions: Yes RLE Weight Bearing: Weight bearing as tolerated      Mobility  Bed Mobility Overal bed mobility: Needs Assistance Bed Mobility: Supine to Sit     Supine to sit: Min assist     General bed mobility comments: Instructed patient on donning KI on RLE.  Verbal cues for bed mobility technique.  Assist to move RLE off of bed.  Transfers Overall transfer level: Needs assistance Equipment used: Rolling walker (2 wheeled) Transfers: Sit to/from [facility] Sit to Stand: Mod assist Stand pivot transfers: Min assist       General transfer comment: Verbal cues for hand placement and technique.  Patient required mod assist to move to standing - unable to bear weight on RLE due to nerve block.  Patient able to bear weight on UE's and pivot to chair.  Ambulation/Gait                Stairs            Wheelchair Mobility    Modified Rankin (Stroke  Patients Only)       Balance                                             Pertinent Vitals/Pain Pain Assessment: 0-10 Pain Score: 5  Pain Location: Rt knee and thigh Pain Descriptors / Indicators: Aching ("pulling") Pain Intervention(s): Limited activity within patient's tolerance;Repositioned    Home Living Family/patient expects to be discharged to:: Private residence Living Arrangements: Spouse/significant other Available Help at Discharge: Family;Available 24 hours/day Type of Home: House Home Access: Stairs to enter Entrance Stairs-Rails: None Entrance Stairs-Number of Steps: 2 Home Layout: Able to live on main level with bedroom/bathroom Home Equipment: Walker - 2 wheels;Cane - single point;Bedside commode      Prior Function Level of Independence: Independent         Comments: Patient very active - ran marathons.       Hand Dominance        Extremity/Trunk Assessment   Upper Extremity Assessment: Overall WFL for tasks assessed           Lower Extremity Assessment: RLE deficits/detail RLE Deficits / Details: Decreased ROM and strength post-op. LLE Deficits / Details: Patient reports pain Lt hip     Communication   Communication: No difficulties  Cognition Arousal/Alertness: Awake/alert Behavior During Therapy: WFL for tasks assessed/performed Overall Cognitive Status: Within  Functional Limits for tasks assessed                      General Comments      Exercises Total Joint Exercises Ankle Circles/Pumps: AROM;Both;10 reps;Seated Quad Sets: AROM;Right;10 reps;Seated      Assessment/Plan    PT Assessment Patient needs continued PT services  PT Diagnosis Difficulty walking;Acute pain   PT Problem List Decreased strength;Decreased range of motion;Decreased activity tolerance;Decreased balance;Decreased mobility;Decreased knowledge of use of DME;Decreased knowledge of precautions;Pain  PT Treatment Interventions  DME instruction;Gait training;Stair training;Functional mobility training;Therapeutic activities;Therapeutic exercise;Patient/family education   PT Goals (Current goals can be found in the Care Plan section) Acute Rehab PT Goals Patient Stated Goal: To walk.  To get back to exercising PT Goal Formulation: With patient Time For Goal Achievement: 06/24/14 Potential to Achieve Goals: Good    Frequency 7X/week   Barriers to discharge        Co-evaluation               End of Session Equipment Utilized During Treatment: Gait belt;Right knee immobilizer Activity Tolerance: Patient tolerated treatment well Patient left: in chair;with call bell/phone within reach Nurse Communication: Mobility status         Time: 1607-3710 PT Time Calculation (min) (ACUTE ONLY): 27 min   Charges:   PT Evaluation $Initial PT Evaluation Tier I: 1 Procedure PT Treatments $Therapeutic Activity: 23-37 mins   PT G CodesDespina Pole 06-24-14, 3:47 PM Carita Pian. Sanjuana Kava, [place] Pager [phone]

## 2014-06-18 LAB — BASIC METABOLIC PANEL
Anion gap: 6 (ref 5–15)
BUN: 9 mg/dL (ref 6–23)
CO2: 30 mmol/L (ref 19–32)
Calcium: 9.1 mg/dL (ref 8.4–10.5)
Chloride: 100 mmol/L (ref 96–112)
Creatinine, Ser: 0.71 mg/dL (ref 0.50–1.35)
GFR calc Af Amer: >90 mL/min (ref >90)
GFR calc non Af Amer: >90 mL/min (ref >90)
Glucose, Bld: 142 mg/dL — ABNORMAL HIGH (ref 70–99)
Potassium: 4.1 mmol/L (ref 3.5–5.1)
Sodium: 136 mmol/L (ref 135–145)

## 2014-06-18 LAB — CBC
HCT: 34.1 % — ABNORMAL LOW (ref 39.0–52.0)
Hemoglobin: 11.4 g/dL — ABNORMAL LOW (ref 13.0–17.0)
MCH: 29.5 pg (ref 26.0–34.0)
MCHC: 33.4 g/dL (ref 30.0–36.0)
MCV: 88.3 fL (ref 78.0–100.0)
Platelets: 209 10*3/uL (ref 150–400)
RBC: 3.86 MIL/uL — ABNORMAL LOW (ref 4.22–5.81)
RDW: 16.6 % — ABNORMAL HIGH (ref 11.5–15.5)
WBC: 17.4 10*3/uL — ABNORMAL HIGH (ref 4.0–10.5)

## 2014-06-18 NOTE — Progress Notes (Addendum)
CARE MANAGEMENT NOTE 06/18/2014  Patient:  KHYRIN, TREVATHAN   Account Number:  [id number]  Date Initiated:  06/18/2014  Documentation initiated by:  [facility]  Subjective/Objective Assessment:     Action/Plan:   Anticipated DC Date:  06/18/2014   Anticipated DC Plan:  [place]  CM consult      [facility] Choice  HOME HEALTH   Choice offered to / List presented to:  C-1 Patient        [place] arranged  [facility]      [place].   Status of service:  Completed, signed off Medicare Important Message given?   (If response is "NO", the following Medicare IM given date fields will be blank) Date Medicare IM given:   Medicare IM given by:   Date Additional Medicare IM given:   Additional Medicare IM given by:    Discharge Disposition:  [place]  Per UR Regulation:    If discussed at Long Length of Stay Meetings, dates discussed:    Comments:  06/18/2014 11:10 AM  NCM spoke to pt and states he uses [facility] for [facility] in the past. He has RW and 3n1 at home. Contacted [facility] to arrange [place]. Jonnie Finner RN CCM Case Mgmt phone [phone]

## 2014-06-18 NOTE — Progress Notes (Signed)
   PATIENT ID: Levi [name]   1 Day Post-Op Procedure(s) (LRB): TOTAL KNEE ARTHROPLASTY (Right)  Subjective: Doing well this am. Sitting in chair. He is showing me that he can get up with the walker. Eager to work with PT and go home today. Minimal pain   Objective:  Filed Vitals:   06/18/14 0528  BP: 147/77  Pulse: 64  Temp: 97.4 F (36.3 C)  Resp: 18      R LE dressing c/d/i Drain pulled uneventaully Wiggles toes, distally NVI  Calves soft, nontender  Labs:   Recent Labs  06/18/14 0407  HGB 11.4*   Recent Labs  06/18/14 0407  WBC 17.4*  RBC 3.86*  HCT 34.1*  PLT 209   Recent Labs  06/18/14 0407  NA 136  K 4.1  CL 100  CO2 30  BUN 9  CREATININE 0.71  GLUCOSE 142*  CALCIUM 9.1    Assessment and Plan: 1 day s/p right TKA WBAT with PT, up with PT today Okay to d/c home today if cleared by PT, HH being set up Continue CPM Scripts in chart.  Follow up with Dr. Berenice Primas in 2 weeks  VTE proph: ASA 325mg  BID, SCDs

## 2014-06-18 NOTE — Progress Notes (Signed)
Physical Therapy Treatment Patient Details Name: Levi [name] MRN: [id number] DOB: 07/06/1945 Today's Date: 06/18/2014    History of Present Illness Patient is a 69 yo male admitted 06/17/14 now s/p Rt TKA.  PMH:  Asthma, Agent Orange exposure, HLD, DDD, PAF, dysrhythmia, COPD, Lt THA '11, lumbar fusion '12, Lt TKA '13, reverse TSA '13, ACDF '14    PT Comments    Patient doing well with mobility and gait.  Able to negotiate stairs with min guard assist.  Patient ready for d/c from PT perspective.  Goals achieved.  Follow Up Recommendations  Home health PT;Supervision/Assistance - 24 hour     Equipment Recommendations  None recommended by PT    Recommendations for Other Services       Precautions / Restrictions Precautions Precautions: Knee Restrictions Weight Bearing Restrictions: Yes RLE Weight Bearing: Weight bearing as tolerated    Mobility  Bed Mobility               General bed mobility comments: Patient ambulating in hallway on his own.  Transfers Overall transfer level: Modified independent Equipment used: Rolling walker (2 wheeled) Transfers: Sit to/from Stand Sit to Stand: Modified independent (Device/Increase time)            Ambulation/Gait Ambulation/Gait assistance: Modified independent (Device/Increase time) Ambulation Distance (Feet): 220 Feet Assistive device: Rolling walker (2 wheeled) Gait Pattern/deviations: Step-through pattern;Decreased stance time - right;Decreased step length - left;Antalgic Gait velocity: WFL   General Gait Details: Patient ambulating on his own in hallway.  Cues to stand upright.   Stairs Stairs: Yes Stairs assistance: Min guard Stair Management: One rail Right;Step to pattern;Forwards Number of Stairs: 4 General stair comments: Instructed patient on safe negotiation of stairs.  Patient able to perform with min guard assist.  Wheelchair Mobility    Modified Rankin (Stroke Patients Only)       Balance                                     Cognition Arousal/Alertness: Awake/alert Behavior During Therapy: WFL for tasks assessed/performed Overall Cognitive Status: Within Functional Limits for tasks assessed                      Exercises Total Joint Exercises Ankle Circles/Pumps: AROM;Both;10 reps;Seated Quad Sets: AROM;Right;10 reps;Seated Long Arc Quad: AROM;Right;10 reps;Seated Knee Flexion: AROM;AAROM;Right;10 reps;Seated Goniometric ROM: -15 ext; 90 flex    General Comments        Pertinent Vitals/Pain Pain Assessment: 0-10 Pain Score: 2  Pain Location: Rt knee Pain Descriptors / Indicators: Sore Pain Intervention(s): Repositioned    Home Living                      Prior Function            PT Goals (current goals can now be found in the care plan section) Progress towards PT goals: Goals met/education completed, patient discharged from PT    Frequency  7X/week    PT Plan Current plan remains appropriate    Co-evaluation             End of Session   Activity Tolerance: Patient tolerated treatment well Patient left: in chair;with call bell/phone within reach     Time: 1209-1221 PT Time Calculation (min) (ACUTE ONLY): 12 min  Charges:  $Gait Training: 8-22 mins  G Codes:      Despina Pole 06/18/2014, 12:58 PM Carita Pian. Sanjuana Kava, [place] Pager [phone]

## 2014-06-20 ENCOUNTER — Encounter (HOSPITAL_COMMUNITY): Payer: Self-pay | Admitting: Orthopedic Surgery

## 2014-06-20 DIAGNOSIS — J449 Chronic obstructive pulmonary disease, unspecified: Secondary | ICD-10-CM | POA: Diagnosis not present

## 2014-06-20 DIAGNOSIS — Z471 Aftercare following joint replacement surgery: Secondary | ICD-10-CM | POA: Diagnosis not present

## 2014-06-20 DIAGNOSIS — Z03818 Encounter for observation for suspected exposure to other biological agents ruled out: Secondary | ICD-10-CM | POA: Diagnosis not present

## 2014-06-20 DIAGNOSIS — I4891 Unspecified atrial fibrillation: Secondary | ICD-10-CM | POA: Diagnosis not present

## 2014-06-20 DIAGNOSIS — Z96651 Presence of right artificial knee joint: Secondary | ICD-10-CM | POA: Diagnosis not present

## 2014-06-20 NOTE — Discharge Summary (Signed)
Patient ID: Levi [name] MRN: [id number] DOB/AGE: 1946/03/25 69 y.o.  Admit date: 06/17/2014 Discharge date: 06/18/2014  Admission Diagnoses:  Principal Problem:   Primary osteoarthritis of right knee Active Problems:   Chronic obstructive airway disease with asthma   Discharge Diagnoses:  Same  Past Medical History  Diagnosis Date  . Asthma   . ALLERGIC RHINITIS   . Hyperlipidemia     takes Lipitor daily  . Agent orange exposure 1970's    takes Imdur,Diltiazem, and Atacand daily  . Joint pain   . Joint swelling   . Elevated uric acid in blood     takes Allopurinol daily  . Bruises easily     d/t prednisone daily  . GERD (gastroesophageal reflux disease)     takes Protonix daily  . Diverticulitis   . Enlarged prostate     but not on any meds  . History of pneumonia     a. 2010  . Chronic bronchitis     "used to get it q yr; last time ?2008" (04/28/2012)  . H/O hiatal hernia   . Degenerative disk disease     "qwhere" (04/28/2012)  . Personal history of colonic adenomas 02/09/2013  . PAF (paroxysmal atrial fibrillation)     a. Dx 04/2012, CHA2DS2VASc = 1 (age);  b. 04/2012 Echo: EF 40-50%, mild MR.  [name] Dysrhythmia     afib  . COPD (chronic obstructive pulmonary disease)     agent orange exposure    Surgeries: Procedure(s): TOTAL KNEE ARTHROPLASTY on 06/17/2014   Consultants:    Discharged Condition: Improved  Hospital Course: Levi [name] is an 69 y.o. male who was admitted 06/17/2014 for operative treatment ofPrimary osteoarthritis of right knee. Patient has severe unremitting pain that affects sleep, daily activities, and work/hobbies. After pre-op clearance the patient was taken to the operating room on 06/17/2014 and underwent  Procedure(s): TOTAL KNEE ARTHROPLASTY.    Patient was given perioperative antibiotics: Anti-infectives    Start     Dose/Rate Route Frequency Ordered Stop   06/17/14 1400  ceFAZolin (ANCEF) IVPB 2 g/50 mL premix     2 g100 mL/hr over  30 Minutes Intravenous Every 6 hours 06/17/14 1217 06/17/14 2101   06/17/14 0714  ceFAZolin (ANCEF) 2-3 GM-% IVPB SOLR    CommentsGarrison Columbus   : cabinet override      06/17/14 0714 06/17/14 0732       Patient was given sequential compression devices, early ambulation, and ASA 325mg  to prevent DVT.  Patient benefited maximally from hospital stay and there were no complications.    Recent vital signs: No data found.    Recent laboratory studies:  Recent Labs  06/18/14 0407  WBC 17.4*  HGB 11.4*  HCT 34.1*  PLT 209  NA 136  K 4.1  CL 100  CO2 30  BUN 9  CREATININE 0.71  GLUCOSE 142*  CALCIUM 9.1     Discharge Medications:     Medication List    STOP taking these medications        amoxicillin 500 MG capsule  Commonly known as:  AMOXIL     cyclobenzaprine 10 MG tablet  Commonly known as:  FLEXERIL      TAKE these medications        allopurinol 100 MG tablet  Commonly known as:  ZYLOPRIM  Take 100 mg by mouth daily.     aspirin EC 325 MG tablet  Take 1 tablet (325 mg total) by  mouth 2 (two) times daily after a meal. Take x 1 month post op to decrease risk of blood clots.     atorvastatin 10 MG tablet  Commonly known as:  LIPITOR  TAKE 1 TABLET (10 MG TOTAL) BY MOUTH DAILY.     beclomethasone 80 MCG/ACT inhaler  Commonly known as:  QVAR  Inhale 2 puffs into the lungs 2 (two) times daily.     candesartan 4 MG tablet  Commonly known as:  ATACAND  Take 0.5 tablets (2 mg total) by mouth daily.     diltiazem 180 MG 24 hr capsule  Commonly known as:  CARDIZEM CD  TAKE 1 CAPSULE (180 MG TOTAL) BY MOUTH DAILY.     FORADIL AEROLIZER 12 MCG capsule for inhaler  Generic drug:  formoterol  PLACE 1 CAPSULE (12 MCG TOTAL) INTO INHALER AND INHALE 2 (TWO) TIMES DAILY.     guaiFENesin 600 MG 12 hr tablet  Commonly known as:  MUCINEX  Take 600 mg by mouth 2 (two) times daily as needed for cough or to loosen phlegm.     isosorbide mononitrate 30 MG 24 hr  tablet  Commonly known as:  IMDUR  TAKE 1 TABLET (30 MG TOTAL) BY MOUTH DAILY.     methocarbamol 500 MG tablet  Commonly known as:  ROBAXIN  Take 1 tablet (500 mg total) by mouth every 8 (eight) hours as needed for muscle spasms.     oxyCODONE-acetaminophen 5-325 MG per tablet  Commonly known as:  PERCOCET/ROXICET  Take 1-2 tablets by mouth every 4 (four) hours as needed for severe pain.     pantoprazole 40 MG tablet  Commonly known as:  PROTONIX  TAKE 1 TABLET BY MOUTH TWICE A DAY     predniSONE 10 MG tablet  Commonly known as:  DELTASONE  Take 10 mg by mouth daily. continuous     PROVENTIL HFA 108 (90 BASE) MCG/ACT inhaler  Generic drug:  albuterol  INHALE 2 PUFFS INTO THE LUNGS EVERY 4 (FOUR) HOURS AS NEEDED FOR WHEEZING.     PROVENTIL HFA 108 (90 BASE) MCG/ACT inhaler  Generic drug:  albuterol  INHALE 2 PUFFS INTO THE LUNGS EVERY 4 (FOUR) HOURS AS NEEDED FOR WHEEZING.        Diagnostic Studies: No results found.  Disposition: 01-Home or Self Care      Discharge Instructions    Call MD / Call 911    Complete by:  As directed   If you experience chest pain or shortness of breath, CALL 911 and be transported to the hospital emergency room.  If you develope a fever above 101 F, pus (white drainage) or increased drainage or redness at the wound, or calf pain, call your surgeon's office.     Constipation Prevention    Complete by:  As directed   Drink plenty of fluids.  Prune juice may be helpful.  You may use a stool softener, such as Colace (over the counter) 100 mg twice a day.  Use MiraLax (over the counter) for constipation as needed.     Diet - low sodium heart healthy    Complete by:  As directed      Increase activity slowly as tolerated    Complete by:  As directed      Weight bearing as tolerated    Complete by:  As directed   Laterality:  right  Extremity:  Lower           Follow-up Information  Follow up with [facility].   Why:   Home Health Physical Therapy   Contact information:   [address] [place] [address] [phone]        Signed: Grier Mitts 06/20/2014, 3:05 PM

## 2014-06-22 DIAGNOSIS — I4891 Unspecified atrial fibrillation: Secondary | ICD-10-CM | POA: Diagnosis not present

## 2014-06-22 DIAGNOSIS — J449 Chronic obstructive pulmonary disease, unspecified: Secondary | ICD-10-CM | POA: Diagnosis not present

## 2014-06-22 DIAGNOSIS — Z471 Aftercare following joint replacement surgery: Secondary | ICD-10-CM | POA: Diagnosis not present

## 2014-06-22 DIAGNOSIS — Z96651 Presence of right artificial knee joint: Secondary | ICD-10-CM | POA: Diagnosis not present

## 2014-06-22 DIAGNOSIS — Z03818 Encounter for observation for suspected exposure to other biological agents ruled out: Secondary | ICD-10-CM | POA: Diagnosis not present

## 2014-06-24 ENCOUNTER — Ambulatory Visit (INDEPENDENT_AMBULATORY_CARE_PROVIDER_SITE_OTHER): Payer: Medicare Other

## 2014-06-24 DIAGNOSIS — J309 Allergic rhinitis, unspecified: Secondary | ICD-10-CM | POA: Diagnosis not present

## 2014-06-25 DIAGNOSIS — Z96651 Presence of right artificial knee joint: Secondary | ICD-10-CM | POA: Diagnosis not present

## 2014-06-25 DIAGNOSIS — I4891 Unspecified atrial fibrillation: Secondary | ICD-10-CM | POA: Diagnosis not present

## 2014-06-25 DIAGNOSIS — J449 Chronic obstructive pulmonary disease, unspecified: Secondary | ICD-10-CM | POA: Diagnosis not present

## 2014-06-25 DIAGNOSIS — Z471 Aftercare following joint replacement surgery: Secondary | ICD-10-CM | POA: Diagnosis not present

## 2014-06-25 DIAGNOSIS — Z03818 Encounter for observation for suspected exposure to other biological agents ruled out: Secondary | ICD-10-CM | POA: Diagnosis not present

## 2014-06-27 DIAGNOSIS — I4891 Unspecified atrial fibrillation: Secondary | ICD-10-CM | POA: Diagnosis not present

## 2014-06-27 DIAGNOSIS — Z471 Aftercare following joint replacement surgery: Secondary | ICD-10-CM | POA: Diagnosis not present

## 2014-06-27 DIAGNOSIS — Z03818 Encounter for observation for suspected exposure to other biological agents ruled out: Secondary | ICD-10-CM | POA: Diagnosis not present

## 2014-06-27 DIAGNOSIS — Z96651 Presence of right artificial knee joint: Secondary | ICD-10-CM | POA: Diagnosis not present

## 2014-06-27 DIAGNOSIS — J449 Chronic obstructive pulmonary disease, unspecified: Secondary | ICD-10-CM | POA: Diagnosis not present

## 2014-06-29 ENCOUNTER — Ambulatory Visit (INDEPENDENT_AMBULATORY_CARE_PROVIDER_SITE_OTHER): Payer: Medicare Other

## 2014-06-29 DIAGNOSIS — J449 Chronic obstructive pulmonary disease, unspecified: Secondary | ICD-10-CM | POA: Diagnosis not present

## 2014-06-29 DIAGNOSIS — J309 Allergic rhinitis, unspecified: Secondary | ICD-10-CM | POA: Diagnosis not present

## 2014-06-29 DIAGNOSIS — I4891 Unspecified atrial fibrillation: Secondary | ICD-10-CM | POA: Diagnosis not present

## 2014-06-29 DIAGNOSIS — Z471 Aftercare following joint replacement surgery: Secondary | ICD-10-CM | POA: Diagnosis not present

## 2014-06-29 DIAGNOSIS — Z96651 Presence of right artificial knee joint: Secondary | ICD-10-CM | POA: Diagnosis not present

## 2014-06-29 DIAGNOSIS — Z03818 Encounter for observation for suspected exposure to other biological agents ruled out: Secondary | ICD-10-CM | POA: Diagnosis not present

## 2014-07-01 DIAGNOSIS — I4891 Unspecified atrial fibrillation: Secondary | ICD-10-CM | POA: Diagnosis not present

## 2014-07-01 DIAGNOSIS — Z96651 Presence of right artificial knee joint: Secondary | ICD-10-CM | POA: Diagnosis not present

## 2014-07-01 DIAGNOSIS — Z471 Aftercare following joint replacement surgery: Secondary | ICD-10-CM | POA: Diagnosis not present

## 2014-07-01 DIAGNOSIS — J449 Chronic obstructive pulmonary disease, unspecified: Secondary | ICD-10-CM | POA: Diagnosis not present

## 2014-07-01 DIAGNOSIS — Z03818 Encounter for observation for suspected exposure to other biological agents ruled out: Secondary | ICD-10-CM | POA: Diagnosis not present

## 2014-07-04 ENCOUNTER — Ambulatory Visit (INDEPENDENT_AMBULATORY_CARE_PROVIDER_SITE_OTHER): Payer: Medicare Other

## 2014-07-04 DIAGNOSIS — J309 Allergic rhinitis, unspecified: Secondary | ICD-10-CM

## 2014-07-04 DIAGNOSIS — Z9889 Other specified postprocedural states: Secondary | ICD-10-CM | POA: Diagnosis not present

## 2014-07-05 DIAGNOSIS — Z96651 Presence of right artificial knee joint: Secondary | ICD-10-CM | POA: Diagnosis not present

## 2014-07-05 DIAGNOSIS — Z03818 Encounter for observation for suspected exposure to other biological agents ruled out: Secondary | ICD-10-CM | POA: Diagnosis not present

## 2014-07-05 DIAGNOSIS — Z471 Aftercare following joint replacement surgery: Secondary | ICD-10-CM | POA: Diagnosis not present

## 2014-07-05 DIAGNOSIS — J449 Chronic obstructive pulmonary disease, unspecified: Secondary | ICD-10-CM | POA: Diagnosis not present

## 2014-07-05 DIAGNOSIS — I4891 Unspecified atrial fibrillation: Secondary | ICD-10-CM | POA: Diagnosis not present

## 2014-07-07 DIAGNOSIS — M25561 Pain in right knee: Secondary | ICD-10-CM | POA: Diagnosis not present

## 2014-07-11 ENCOUNTER — Ambulatory Visit (INDEPENDENT_AMBULATORY_CARE_PROVIDER_SITE_OTHER): Payer: Medicare Other

## 2014-07-11 DIAGNOSIS — M25561 Pain in right knee: Secondary | ICD-10-CM | POA: Diagnosis not present

## 2014-07-11 DIAGNOSIS — J309 Allergic rhinitis, unspecified: Secondary | ICD-10-CM | POA: Diagnosis not present

## 2014-07-13 DIAGNOSIS — M25561 Pain in right knee: Secondary | ICD-10-CM | POA: Diagnosis not present

## 2014-07-15 DIAGNOSIS — M25561 Pain in right knee: Secondary | ICD-10-CM | POA: Diagnosis not present

## 2014-07-18 ENCOUNTER — Ambulatory Visit (INDEPENDENT_AMBULATORY_CARE_PROVIDER_SITE_OTHER): Payer: Medicare Other

## 2014-07-18 DIAGNOSIS — J309 Allergic rhinitis, unspecified: Secondary | ICD-10-CM

## 2014-07-18 DIAGNOSIS — M25561 Pain in right knee: Secondary | ICD-10-CM | POA: Diagnosis not present

## 2014-07-20 ENCOUNTER — Encounter: Payer: Self-pay | Admitting: Pulmonary Disease

## 2014-07-20 ENCOUNTER — Ambulatory Visit (INDEPENDENT_AMBULATORY_CARE_PROVIDER_SITE_OTHER): Payer: Medicare Other | Admitting: Pulmonary Disease

## 2014-07-20 VITALS — BP 122/70 | HR 88 | Temp 97.0°F | Ht 74.0 in | Wt 155.4 lb

## 2014-07-20 DIAGNOSIS — J449 Chronic obstructive pulmonary disease, unspecified: Secondary | ICD-10-CM

## 2014-07-20 DIAGNOSIS — M25561 Pain in right knee: Secondary | ICD-10-CM | POA: Diagnosis not present

## 2014-07-20 NOTE — Patient Instructions (Signed)
No change in your breathing medications Can try and decrease prednisone to 5mg  a day if you are doing well followup with me again in 68mos, but call if you are having worsening symptoms as we discussed.

## 2014-07-20 NOTE — Assessment & Plan Note (Signed)
The pt continues to do well from a pulmonary standpoint.  He is staying on his maintenance therapy, and continues his exercise program.  He rarely uses his rescue inhaler.  I have asked him to continue on his meds, and try to decrease prednisone dose as able.

## 2014-07-20 NOTE — Progress Notes (Signed)
   Subjective:    Patient ID: Levi [name], male    DOB: Jun 18, 1945, 69 y.o.   MRN: [id number]  HPI The patient comes in today for follow-up of his known asthma and probable ABPA. He has done well on his maintenance bronchodilator regimen, as well as low-dose prednisone. He has just had joint replacement surgery, and is doing well with rehabilitation. He denies any significant cough or congestion, and rarely uses his maintenance inhaler. He does think he is starting to develop a cold.   Review of Systems  Constitutional: Negative for fever and unexpected weight change.  HENT: Positive for congestion and postnasal drip. Negative for dental problem, ear pain, nosebleeds, rhinorrhea, sinus pressure, sneezing, sore throat and trouble swallowing.   Eyes: Negative for redness and itching.  Respiratory: Positive for cough. Negative for chest tightness, shortness of breath and wheezing.   Cardiovascular: Negative for palpitations and leg swelling.  Gastrointestinal: Negative for nausea and vomiting.  Genitourinary: Negative for dysuria.  Musculoskeletal: Negative for joint swelling.  Skin: Negative for rash.  Neurological: Negative for headaches.  Hematological: Does not bruise/bleed easily.  Psychiatric/Behavioral: Negative for dysphoric mood. The patient is not nervous/anxious.        Objective:   Physical Exam Thin male in no acute distress Nose without purulence or discharge noted Neck without lymphadenopathy or thyromegaly Chest totally clear to auscultation, no wheezing Cardiac exam with regular rate and rhythm Lower extremities without edema, no cyanosis Alert and oriented, moves all 4 extremities.       Assessment & Plan:

## 2014-07-22 DIAGNOSIS — M25561 Pain in right knee: Secondary | ICD-10-CM | POA: Diagnosis not present

## 2014-07-25 ENCOUNTER — Ambulatory Visit (INDEPENDENT_AMBULATORY_CARE_PROVIDER_SITE_OTHER): Payer: Medicare Other

## 2014-07-25 DIAGNOSIS — J309 Allergic rhinitis, unspecified: Secondary | ICD-10-CM

## 2014-07-25 DIAGNOSIS — M25561 Pain in right knee: Secondary | ICD-10-CM | POA: Diagnosis not present

## 2014-07-27 DIAGNOSIS — M25561 Pain in right knee: Secondary | ICD-10-CM | POA: Diagnosis not present

## 2014-07-29 DIAGNOSIS — M25561 Pain in right knee: Secondary | ICD-10-CM | POA: Diagnosis not present

## 2014-08-01 ENCOUNTER — Ambulatory Visit (INDEPENDENT_AMBULATORY_CARE_PROVIDER_SITE_OTHER): Payer: Medicare Other

## 2014-08-01 DIAGNOSIS — Z471 Aftercare following joint replacement surgery: Secondary | ICD-10-CM | POA: Diagnosis not present

## 2014-08-01 DIAGNOSIS — M25561 Pain in right knee: Secondary | ICD-10-CM | POA: Diagnosis not present

## 2014-08-01 DIAGNOSIS — J309 Allergic rhinitis, unspecified: Secondary | ICD-10-CM

## 2014-08-05 ENCOUNTER — Encounter: Payer: Self-pay | Admitting: Internal Medicine

## 2014-08-05 DIAGNOSIS — M25561 Pain in right knee: Secondary | ICD-10-CM | POA: Diagnosis not present

## 2014-08-08 ENCOUNTER — Ambulatory Visit (INDEPENDENT_AMBULATORY_CARE_PROVIDER_SITE_OTHER): Payer: Medicare Other

## 2014-08-08 DIAGNOSIS — M25561 Pain in right knee: Secondary | ICD-10-CM | POA: Diagnosis not present

## 2014-08-08 DIAGNOSIS — J309 Allergic rhinitis, unspecified: Secondary | ICD-10-CM

## 2014-08-12 DIAGNOSIS — M25561 Pain in right knee: Secondary | ICD-10-CM | POA: Diagnosis not present

## 2014-08-15 ENCOUNTER — Ambulatory Visit (INDEPENDENT_AMBULATORY_CARE_PROVIDER_SITE_OTHER): Payer: Medicare Other

## 2014-08-15 ENCOUNTER — Other Ambulatory Visit: Payer: Self-pay | Admitting: Cardiology

## 2014-08-15 DIAGNOSIS — M25561 Pain in right knee: Secondary | ICD-10-CM | POA: Diagnosis not present

## 2014-08-15 DIAGNOSIS — J309 Allergic rhinitis, unspecified: Secondary | ICD-10-CM

## 2014-08-22 ENCOUNTER — Ambulatory Visit (INDEPENDENT_AMBULATORY_CARE_PROVIDER_SITE_OTHER): Payer: Medicare Other

## 2014-08-22 DIAGNOSIS — J309 Allergic rhinitis, unspecified: Secondary | ICD-10-CM | POA: Diagnosis not present

## 2014-08-25 ENCOUNTER — Ambulatory Visit (INDEPENDENT_AMBULATORY_CARE_PROVIDER_SITE_OTHER): Payer: Medicare Other

## 2014-08-25 DIAGNOSIS — J309 Allergic rhinitis, unspecified: Secondary | ICD-10-CM | POA: Diagnosis not present

## 2014-08-29 ENCOUNTER — Ambulatory Visit (INDEPENDENT_AMBULATORY_CARE_PROVIDER_SITE_OTHER): Payer: Medicare Other

## 2014-08-29 DIAGNOSIS — J309 Allergic rhinitis, unspecified: Secondary | ICD-10-CM | POA: Diagnosis not present

## 2014-09-05 ENCOUNTER — Ambulatory Visit (INDEPENDENT_AMBULATORY_CARE_PROVIDER_SITE_OTHER): Payer: Medicare Other

## 2014-09-05 DIAGNOSIS — J309 Allergic rhinitis, unspecified: Secondary | ICD-10-CM | POA: Diagnosis not present

## 2014-09-07 DIAGNOSIS — N529 Male erectile dysfunction, unspecified: Secondary | ICD-10-CM | POA: Diagnosis not present

## 2014-09-10 ENCOUNTER — Other Ambulatory Visit: Payer: Self-pay | Admitting: Pulmonary Disease

## 2014-09-12 ENCOUNTER — Ambulatory Visit (INDEPENDENT_AMBULATORY_CARE_PROVIDER_SITE_OTHER): Payer: Medicare Other

## 2014-09-12 DIAGNOSIS — J309 Allergic rhinitis, unspecified: Secondary | ICD-10-CM | POA: Diagnosis not present

## 2014-09-19 ENCOUNTER — Ambulatory Visit (INDEPENDENT_AMBULATORY_CARE_PROVIDER_SITE_OTHER): Payer: Medicare Other

## 2014-09-19 DIAGNOSIS — J309 Allergic rhinitis, unspecified: Secondary | ICD-10-CM | POA: Diagnosis not present

## 2014-09-26 ENCOUNTER — Ambulatory Visit (INDEPENDENT_AMBULATORY_CARE_PROVIDER_SITE_OTHER): Payer: Medicare Other

## 2014-09-26 DIAGNOSIS — J309 Allergic rhinitis, unspecified: Secondary | ICD-10-CM | POA: Diagnosis not present

## 2014-10-03 ENCOUNTER — Ambulatory Visit (INDEPENDENT_AMBULATORY_CARE_PROVIDER_SITE_OTHER): Payer: Medicare Other | Admitting: Pulmonary Disease

## 2014-10-03 ENCOUNTER — Ambulatory Visit: Payer: Medicare Other | Admitting: Pulmonary Disease

## 2014-10-03 ENCOUNTER — Encounter: Payer: Self-pay | Admitting: Pulmonary Disease

## 2014-10-03 ENCOUNTER — Ambulatory Visit (INDEPENDENT_AMBULATORY_CARE_PROVIDER_SITE_OTHER): Payer: Medicare Other

## 2014-10-03 VITALS — BP 122/64 | HR 62 | Temp 97.0°F | Ht 74.0 in | Wt 159.2 lb

## 2014-10-03 DIAGNOSIS — J449 Chronic obstructive pulmonary disease, unspecified: Secondary | ICD-10-CM | POA: Diagnosis not present

## 2014-10-03 DIAGNOSIS — J301 Allergic rhinitis due to pollen: Secondary | ICD-10-CM | POA: Diagnosis not present

## 2014-10-03 DIAGNOSIS — J309 Allergic rhinitis, unspecified: Secondary | ICD-10-CM

## 2014-10-03 MED ORDER — FORMOTEROL FUMARATE 12 MCG IN CAPS: ORAL_CAPSULE | RESPIRATORY_TRACT | Status: DC

## 2014-10-03 NOTE — Assessment & Plan Note (Signed)
The patient had increasing shortness of breath last week that is somewhat better over the weekend, but denies chest congestion or purulent mucus from his chest. He is having a lot of postnasal drip and hoarseness, and I suspect this may be contributing. He has not been taking his allergy medication, and I have asked him to start taking Zyrtec daily for the next few weeks. I will also bump up his prednisone in a moderate taper, and then back to his baseline of 5 mg a day.

## 2014-10-03 NOTE — Patient Instructions (Signed)
Get back on zyrtec 10mg  one a day for the next few weeks. Increase prednisone to 20mg  a day for 3 days, then 10mg  a day for 3 days, then back to your usual 5mg  a day. No change in breathing medications. followup with Dr. Annamaria Boots in August, but call if your nasal symptoms are not clearing.

## 2014-10-03 NOTE — Progress Notes (Signed)
   Subjective:    Patient ID: Levi [name], male    DOB: 11/08/1945, 69 y.o.   MRN: [id number]  HPI The patient comes in today for an acute sick visit. He has known chronic obstructive asthma and likely ABPA.  He has been on long-standing prednisone at a very low dose, and is also on allergy vaccine. He has done well on this regimen overall. He comes in today with increasing shortness of breath that started last week, but is actually improved over the weekend. He denies any chest congestion, but is having a lot of postnasal drip with hoarseness. He does cough up brown looking mucus in small quantities, and thinks it is coming from his throat. He is not having sinus pressure or significant congestion. It should also be noted that he has not been taking his antihistamine on a regular basis.   Review of Systems  Constitutional: Negative for fever and unexpected weight change.  HENT: Positive for congestion and postnasal drip. Negative for dental problem, ear pain, nosebleeds, rhinorrhea, sinus pressure, sneezing, sore throat and trouble swallowing.   Eyes: Negative for redness and itching.  Respiratory: Positive for cough, chest tightness, shortness of breath and wheezing.   Cardiovascular: Negative for palpitations and leg swelling.  Gastrointestinal: Negative for nausea and vomiting.  Genitourinary: Negative for dysuria.  Musculoskeletal: Negative for joint swelling.  Skin: Negative for rash.  Neurological: Negative for headaches.  Hematological: Does not bruise/bleed easily.  Psychiatric/Behavioral: Negative for dysphoric mood. The patient is not nervous/anxious.        Objective:   Physical Exam Thin male in no acute distress, hoarse voice noted. Nose without purulence or discharge noted Neck without lymphadenopathy or thyromegaly Chest with mildly decreased breath sounds, no active wheezing Cardiac exam with regular rate and rhythm Lower extremities without edema, no cyanosis Alert  and oriented, moves all 4 extremities.       Assessment & Plan:

## 2014-10-04 ENCOUNTER — Encounter: Payer: Self-pay | Admitting: Cardiology

## 2014-10-04 ENCOUNTER — Ambulatory Visit (INDEPENDENT_AMBULATORY_CARE_PROVIDER_SITE_OTHER): Payer: Medicare Other | Admitting: Cardiology

## 2014-10-04 VITALS — BP 122/64 | HR 62 | Ht 74.0 in | Wt 157.0 lb

## 2014-10-04 DIAGNOSIS — I480 Paroxysmal atrial fibrillation: Secondary | ICD-10-CM | POA: Diagnosis not present

## 2014-10-04 NOTE — Progress Notes (Signed)
[facility]. [address], [place]  [address] Phone: [phone] Fax:  [phone]  Date:  10/04/2014   ID:  Levi [name], DOB 1945/09/15, MRN [id number]  PCP:  Orpah Melter, MD   History of Present Illness: Levi [name] is a 69 y.o. male paroxysmal atrial fibrillation in December of 2013 here for followup.  PAF-low-dose Cardizem is being utilized. Echocardiogram demonstrated EF in the 40-50% range. Prior catheterization in 2010 showed no flow-limiting CAD.  He had an episode of lower from the swelling, saw Murray Hodgkins on 06/03/14. He was upcoming right total knee on 1/22. CHADS VASc 1  Baseline COPD.   CHADSVAS - 1 (age).  Occasional swelling in his ankles. When exercising, he may rarely feel a single pounding heartbeat. No shortness of breath, no syncope. He is had issues with his rods in his back. Long-term prednisone has made his bones frail.  Rare isolated pounding, PVC-like sensation. Occasional twinge in chest. Wife ran [employer].   Overall doing well. No further swelling.   Wt Readings from Last 3 Encounters:  10/04/14 157 lb (71.215 kg)  10/03/14 159 lb 3.2 oz (72.213 kg)  07/20/14 155 lb 6.4 oz (70.489 kg)     Past Medical History  Diagnosis Date  . Asthma   . ALLERGIC RHINITIS   . Hyperlipidemia     takes Lipitor daily  . Agent orange exposure 1970's    takes Imdur,Diltiazem, and Atacand daily  . Joint pain   . Joint swelling   . Elevated uric acid in blood     takes Allopurinol daily  . Bruises easily     d/t prednisone daily  . GERD (gastroesophageal reflux disease)     takes Protonix daily  . Diverticulitis   . Enlarged prostate     but not on any meds  . History of pneumonia     a. 2010  . Chronic bronchitis     "used to get it q yr; last time ?2008" (04/28/2012)  . H/O hiatal hernia   . Degenerative disk disease     "qwhere" (04/28/2012)  . Personal history of colonic adenomas 02/09/2013  . PAF  (paroxysmal atrial fibrillation)     a. Dx 04/2012, CHA2DS2VASc = 1 (age);  b. 04/2012 Echo: EF 40-50%, mild MR.  [name] Dysrhythmia     afib  . COPD (chronic obstructive pulmonary disease)     agent orange exposure    Past Surgical History  Procedure Laterality Date  . Knee surgery    . Gallbladder surgery    . Total hip arthroplasty  2011    "left" (04/28/2012)  . Lateral / posterior combined fusion lumbar spine  2012  . Joint replacement    . Total knee arthroplasty  01/10/2012    Procedure: TOTAL KNEE ARTHROPLASTY;  Surgeon: [name], MD;  Location: [facility];  Service: Orthopedics;;  left total knee arthroplasty  . Cardiac catheterization  11/2008    [name] - 20% calcified non flow limiting left main, 50% EF apical hypokinesis  . Knee arthroscopy      "had them both scoped a bunch of times" (12/07/27/2011)  . Colonoscopy    . Esophagogastroduodenoscopy    . Reverse shoulder arthroplasty  04/28/2012    left  . Tonsillectomy and adenoidectomy  1954  . Cholecystectomy  1990's  . Reverse shoulder arthroplasty  04/28/2012    Procedure: REVERSE SHOULDER ARTHROPLASTY;  Surgeon: Larkin Ina  Caffie Damme, MD;  Location: [facility];  Service: Orthopedics;  Laterality: Left;  Left shouder reverse total shoulder arthroplasty  . Shoulder surgery    . Anterior cervical decomp/discectomy fusion N/A 07/21/2012    Procedure: ANTERIOR CERVICAL DECOMPRESSION/DISCECTOMY FUSION 2 LEVELS;  Surgeon: [name], MD;  Location: [facility];  Service: Neurosurgery;  Laterality: N/A;  C4-5 C5-6 Anterior cervical decompression/diskectomy/fusion  . Eye surgery  2011    "bilaterally; steroidal encapsulation" (04/28/2012)  . Total knee arthroplasty Right 06/17/2014    dr graves  . Total knee arthroplasty Right 06/17/2014    Procedure: TOTAL KNEE ARTHROPLASTY;  Surgeon: [name], MD;  Location: [facility];  Service: Orthopedics;  Laterality: Right;    Current Outpatient Prescriptions  Medication Sig Dispense  Refill  . allopurinol (ZYLOPRIM) 100 MG tablet Take 100 mg by mouth daily.      [name] atorvastatin (LIPITOR) 10 MG tablet TAKE 1 TABLET (10 MG TOTAL) BY MOUTH DAILY. 30 tablet 3  . beclomethasone (QVAR) 80 MCG/ACT inhaler Inhale 2 puffs into the lungs 2 (two) times daily. 1 Inhaler 3  . candesartan (ATACAND) 4 MG tablet TAKE 0.5 TABLETS (2 MG TOTAL) BY MOUTH DAILY. 30 tablet 2  . diltiazem (CARDIZEM CD) 180 MG 24 hr capsule TAKE 1 CAPSULE (180 MG TOTAL) BY MOUTH DAILY. 30 capsule 3  . formoterol (FORADIL AEROLIZER) 12 MCG capsule for inhaler PLACE 1 CAPSULE (12 MCG TOTAL) INTO INHALER AND INHALE 2 (TWO) TIMES DAILY. 60 capsule 11  . isosorbide mononitrate (IMDUR) 30 MG 24 hr tablet TAKE 1 TABLET (30 MG TOTAL) BY MOUTH DAILY. 30 tablet 1  . pantoprazole (PROTONIX) 40 MG tablet TAKE 1 TABLET BY MOUTH TWICE A DAY (Patient taking differently: Take 40 mg by mouth 2 (two) times daily. ) 60 tablet 11  . predniSONE (DELTASONE) 10 MG tablet Take 10 mg by mouth daily. continuous    . PROVENTIL HFA 108 (90 BASE) MCG/ACT inhaler INHALE 2 PUFFS INTO THE LUNGS EVERY 4 (FOUR) HOURS AS NEEDED FOR WHEEZING. 6.7 each 3   No current facility-administered medications for this visit.    Allergies:    Allergies  Allergen Reactions  . Morphine And Related Other (See Comments)    Light headed and sweaty; "my heart races" (04/28/2012)  . Levaquin [Levofloxacin] Itching    Social History:  The patient  reports that he has never smoked. He has never used smokeless tobacco. He reports that he does not drink alcohol or use illicit drugs.   ROS:  Please see the history of present illness.      PHYSICAL EXAM: VS:  BP 122/64 mmHg  Pulse 62  Ht 6\' 2"  (1.88 m)  Wt 157 lb (71.215 kg)  BMI 20.15 kg/m2  SpO2 97% Well nourished, well developed, in no acute distress HEENT: normal Neck: no JVD Cardiac:  normal S1, S2; RRR; no murmur Lungs:  Somewhat distant breath sounds but overall adequate air movement, mildly barrel  chested , no wheezing, rhonchi or rales Abd: soft, nontender, no hepatomegaly Ext: no edema Skin: warm and dry Neuro: no focal abnormalities noted  EKG:  03/28/14-sinus rhythm, 68, no other abnormalities, previousSinus bradycardia rate 55 with no other abnormalities. Normal intervals. No change from prior.    Labs: 10/19/13-LDL 73  ASSESSMENT AND PLAN:  1. Paroxysmal atrial fibrillation-no recent symptomatic episodes. Continue with diltiazem 180. Certainly this can be contributing somewhat to his intermittent edema as a side effect. We will continue to monitor. No anticoagulation because  of CHADSVAS 1. 2. COPD-Dr. Gwenette Greet. Stable. 3. Occasional palpitations-described as a "pounding" single beat. This could be a PVC or PAC. No high-risk symptoms with this such as syncope, racing, shortness of breath. We will continue to monitor. It is been quite sometime since he is had another episode. 4. I will see him back in 6 months. 5. Continue with lipid modification. No changes made with statin.  Signed, Candee Furbish, MD [facility]  10/04/2014 4:03 PM

## 2014-10-04 NOTE — Patient Instructions (Signed)
Medication Instructions:  Your physician recommends that you continue on your current medications as directed. Please refer to the Current Medication list given to you today.  Follow-Up: Follow up in 6 months with Dr. Skains.  You will receive a letter in the mail 2 months before you are due.  Please call us when you receive this letter to schedule your follow up appointment.  Thank you for choosing [facility]!!     

## 2014-10-06 DIAGNOSIS — Z96651 Presence of right artificial knee joint: Secondary | ICD-10-CM | POA: Diagnosis not present

## 2014-10-06 DIAGNOSIS — Z09 Encounter for follow-up examination after completed treatment for conditions other than malignant neoplasm: Secondary | ICD-10-CM | POA: Diagnosis not present

## 2014-10-10 ENCOUNTER — Ambulatory Visit (INDEPENDENT_AMBULATORY_CARE_PROVIDER_SITE_OTHER): Payer: Medicare Other

## 2014-10-10 DIAGNOSIS — J309 Allergic rhinitis, unspecified: Secondary | ICD-10-CM

## 2014-10-12 ENCOUNTER — Telehealth: Payer: Self-pay | Admitting: Pulmonary Disease

## 2014-10-12 MED ORDER — OLODATEROL HCL 2.5 MCG/ACT IN AERS: 2.0000 | INHALATION_SPRAY | Freq: Every day | RESPIRATORY_TRACT | Status: DC

## 2014-10-12 NOTE — Telephone Encounter (Signed)
Spoke with Pharmacist, states that Foradil has been discontinued.   [place] do you have an alternative med suggestion for replacement?  Thanks!

## 2014-10-12 NOTE — Telephone Encounter (Signed)
2 puffs every am.  He will need to be shown how to use respimat. Make sure his insurance will cover.

## 2014-10-12 NOTE — Telephone Encounter (Signed)
Rx has been sent in. Nothing further was needed. 

## 2014-10-12 NOTE — Telephone Encounter (Signed)
Pt is agreeable to changing to Striverdi.  [place] - how will the pt taking this? 1 puff daily?

## 2014-10-12 NOTE — Telephone Encounter (Signed)
Can look at striverdi or arcapta

## 2014-10-13 DIAGNOSIS — G8929 Other chronic pain: Secondary | ICD-10-CM | POA: Diagnosis not present

## 2014-10-13 DIAGNOSIS — M5442 Lumbago with sciatica, left side: Secondary | ICD-10-CM | POA: Diagnosis not present

## 2014-10-17 ENCOUNTER — Ambulatory Visit (INDEPENDENT_AMBULATORY_CARE_PROVIDER_SITE_OTHER): Payer: Medicare Other

## 2014-10-17 DIAGNOSIS — J309 Allergic rhinitis, unspecified: Secondary | ICD-10-CM | POA: Diagnosis not present

## 2014-10-18 ENCOUNTER — Other Ambulatory Visit: Payer: Self-pay | Admitting: Cardiology

## 2014-10-20 ENCOUNTER — Other Ambulatory Visit (INDEPENDENT_AMBULATORY_CARE_PROVIDER_SITE_OTHER): Payer: Medicare Other | Admitting: *Deleted

## 2014-10-20 DIAGNOSIS — E785 Hyperlipidemia, unspecified: Secondary | ICD-10-CM | POA: Diagnosis not present

## 2014-10-20 LAB — LIPID PANEL
Cholesterol: 168 mg/dL (ref 0–200)
HDL: 92.5 mg/dL (ref >39.00)
LDL Cholesterol: 64 mg/dL (ref 0–99)
NonHDL: 75.5
Total CHOL/HDL Ratio: 2
Triglycerides: 60 mg/dL (ref 0.0–149.0)
VLDL: 12 mg/dL (ref 0.0–40.0)

## 2014-10-20 LAB — HEPATIC FUNCTION PANEL
ALT: 14 U/L (ref 0–53)
AST: 19 U/L (ref 0–37)
Albumin: 4 g/dL (ref 3.5–5.2)
Alkaline Phosphatase: 59 U/L (ref 39–117)
Bilirubin, Direct: 0.1 mg/dL (ref 0.0–0.3)
Total Bilirubin: 0.6 mg/dL (ref 0.2–1.2)
Total Protein: 6.2 g/dL (ref 6.0–8.3)

## 2014-10-21 DIAGNOSIS — M47814 Spondylosis without myelopathy or radiculopathy, thoracic region: Secondary | ICD-10-CM | POA: Diagnosis not present

## 2014-10-21 DIAGNOSIS — M5442 Lumbago with sciatica, left side: Secondary | ICD-10-CM | POA: Diagnosis not present

## 2014-10-21 DIAGNOSIS — M5135 Other intervertebral disc degeneration, thoracolumbar region: Secondary | ICD-10-CM | POA: Diagnosis not present

## 2014-10-21 DIAGNOSIS — M47815 Spondylosis without myelopathy or radiculopathy, thoracolumbar region: Secondary | ICD-10-CM | POA: Diagnosis not present

## 2014-10-21 DIAGNOSIS — M47816 Spondylosis without myelopathy or radiculopathy, lumbar region: Secondary | ICD-10-CM | POA: Diagnosis not present

## 2014-10-25 ENCOUNTER — Ambulatory Visit (INDEPENDENT_AMBULATORY_CARE_PROVIDER_SITE_OTHER): Payer: Medicare Other

## 2014-10-25 DIAGNOSIS — J309 Allergic rhinitis, unspecified: Secondary | ICD-10-CM

## 2014-10-31 ENCOUNTER — Ambulatory Visit (INDEPENDENT_AMBULATORY_CARE_PROVIDER_SITE_OTHER): Payer: Medicare Other

## 2014-10-31 DIAGNOSIS — J309 Allergic rhinitis, unspecified: Secondary | ICD-10-CM

## 2014-11-02 ENCOUNTER — Encounter: Payer: Self-pay | Admitting: Internal Medicine

## 2014-11-02 ENCOUNTER — Telehealth: Payer: Self-pay | Admitting: Internal Medicine

## 2014-11-02 NOTE — Telephone Encounter (Signed)
Error

## 2014-11-06 ENCOUNTER — Other Ambulatory Visit: Payer: Self-pay | Admitting: Pulmonary Disease

## 2014-11-07 ENCOUNTER — Ambulatory Visit (INDEPENDENT_AMBULATORY_CARE_PROVIDER_SITE_OTHER): Payer: Medicare Other

## 2014-11-07 DIAGNOSIS — J309 Allergic rhinitis, unspecified: Secondary | ICD-10-CM | POA: Diagnosis not present

## 2014-11-10 DIAGNOSIS — M4326 Fusion of spine, lumbar region: Secondary | ICD-10-CM | POA: Diagnosis not present

## 2014-11-14 ENCOUNTER — Ambulatory Visit (INDEPENDENT_AMBULATORY_CARE_PROVIDER_SITE_OTHER): Payer: Medicare Other

## 2014-11-14 DIAGNOSIS — J309 Allergic rhinitis, unspecified: Secondary | ICD-10-CM

## 2014-11-15 ENCOUNTER — Emergency Department (HOSPITAL_BASED_OUTPATIENT_CLINIC_OR_DEPARTMENT_OTHER): Payer: Medicare Other

## 2014-11-15 ENCOUNTER — Ambulatory Visit (HOSPITAL_BASED_OUTPATIENT_CLINIC_OR_DEPARTMENT_OTHER)
Admission: RE | Admit: 2014-11-15 | Discharge: 2014-11-15 | Disposition: A | Discharge status: Home / Self Care | Payer: Medicare Other | Source: Ambulatory Visit | Attending: Family Medicine | Admitting: Family Medicine

## 2014-11-15 ENCOUNTER — Other Ambulatory Visit: Payer: Self-pay

## 2014-11-15 ENCOUNTER — Other Ambulatory Visit (HOSPITAL_BASED_OUTPATIENT_CLINIC_OR_DEPARTMENT_OTHER): Payer: Self-pay | Admitting: Family Medicine

## 2014-11-15 ENCOUNTER — Emergency Department (HOSPITAL_BASED_OUTPATIENT_CLINIC_OR_DEPARTMENT_OTHER)
Admission: EM | Admit: 2014-11-15 | Discharge: 2014-11-15 | Disposition: A | Discharge status: Home / Self Care | Payer: Medicare Other | Attending: Emergency Medicine | Admitting: Emergency Medicine

## 2014-11-15 ENCOUNTER — Encounter (HOSPITAL_BASED_OUTPATIENT_CLINIC_OR_DEPARTMENT_OTHER): Payer: Self-pay | Admitting: *Deleted

## 2014-11-15 DIAGNOSIS — Z8701 Personal history of pneumonia (recurrent): Secondary | ICD-10-CM | POA: Diagnosis not present

## 2014-11-15 DIAGNOSIS — R079 Chest pain, unspecified: Secondary | ICD-10-CM | POA: Diagnosis not present

## 2014-11-15 DIAGNOSIS — Z86010 Personal history of colonic polyps: Secondary | ICD-10-CM | POA: Diagnosis not present

## 2014-11-15 DIAGNOSIS — R600 Localized edema: Secondary | ICD-10-CM | POA: Diagnosis not present

## 2014-11-15 DIAGNOSIS — Z7982 Long term (current) use of aspirin: Secondary | ICD-10-CM | POA: Insufficient documentation

## 2014-11-15 DIAGNOSIS — M79605 Pain in left leg: Secondary | ICD-10-CM

## 2014-11-15 DIAGNOSIS — J441 Chronic obstructive pulmonary disease with (acute) exacerbation: Secondary | ICD-10-CM | POA: Diagnosis not present

## 2014-11-15 DIAGNOSIS — Z8679 Personal history of other diseases of the circulatory system: Secondary | ICD-10-CM | POA: Diagnosis not present

## 2014-11-15 DIAGNOSIS — M791 Myalgia, unspecified site: Secondary | ICD-10-CM

## 2014-11-15 DIAGNOSIS — Z7951 Long term (current) use of inhaled steroids: Secondary | ICD-10-CM | POA: Diagnosis not present

## 2014-11-15 DIAGNOSIS — K219 Gastro-esophageal reflux disease without esophagitis: Secondary | ICD-10-CM | POA: Diagnosis not present

## 2014-11-15 DIAGNOSIS — E785 Hyperlipidemia, unspecified: Secondary | ICD-10-CM | POA: Diagnosis not present

## 2014-11-15 DIAGNOSIS — Z7952 Long term (current) use of systemic steroids: Secondary | ICD-10-CM | POA: Insufficient documentation

## 2014-11-15 DIAGNOSIS — R0602 Shortness of breath: Secondary | ICD-10-CM | POA: Diagnosis not present

## 2014-11-15 DIAGNOSIS — M7989 Other specified soft tissue disorders: Secondary | ICD-10-CM | POA: Insufficient documentation

## 2014-11-15 DIAGNOSIS — R791 Abnormal coagulation profile: Secondary | ICD-10-CM | POA: Diagnosis not present

## 2014-11-15 DIAGNOSIS — Z87448 Personal history of other diseases of urinary system: Secondary | ICD-10-CM | POA: Insufficient documentation

## 2014-11-15 DIAGNOSIS — M79662 Pain in left lower leg: Secondary | ICD-10-CM | POA: Diagnosis not present

## 2014-11-15 LAB — CBC WITH DIFFERENTIAL/PLATELET
Basophils Absolute: 0 10*3/uL (ref 0.0–0.1)
Basophils Relative: 0 % (ref 0–1)
Eosinophils Absolute: 0 10*3/uL (ref 0.0–0.7)
Eosinophils Relative: 0 % (ref 0–5)
HCT: 37.1 % — ABNORMAL LOW (ref 39.0–52.0)
Hemoglobin: 12.1 g/dL — ABNORMAL LOW (ref 13.0–17.0)
Lymphocytes Relative: 28 % (ref 12–46)
Lymphs Abs: 1.8 10*3/uL (ref 0.7–4.0)
MCH: 28.1 pg (ref 26.0–34.0)
MCHC: 32.6 g/dL (ref 30.0–36.0)
MCV: 86.3 fL (ref 78.0–100.0)
Metamyelocytes Relative: 1 %
Monocytes Absolute: 0.1 10*3/uL (ref 0.1–1.0)
Monocytes Relative: 1 % — ABNORMAL LOW (ref 3–12)
Myelocytes: 1 %
Neutro Abs: 4.6 10*3/uL (ref 1.7–7.7)
Neutrophils Relative %: 69 % (ref 43–77)
Platelets: 186 10*3/uL (ref 150–400)
RBC: 4.3 MIL/uL (ref 4.22–5.81)
RDW: 18.7 % — ABNORMAL HIGH (ref 11.5–15.5)
WBC: 6.5 10*3/uL (ref 4.0–10.5)

## 2014-11-15 LAB — BASIC METABOLIC PANEL
Anion gap: 9 (ref 5–15)
BUN: 22 mg/dL — ABNORMAL HIGH (ref 6–20)
CO2: 26 mmol/L (ref 22–32)
Calcium: 9.6 mg/dL (ref 8.9–10.3)
Chloride: 105 mmol/L (ref 101–111)
Creatinine, Ser: 0.93 mg/dL (ref 0.61–1.24)
GFR calc Af Amer: >60 mL/min (ref >60)
GFR calc non Af Amer: >60 mL/min (ref >60)
Glucose, Bld: 122 mg/dL — ABNORMAL HIGH (ref 65–99)
Potassium: 3.8 mmol/L (ref 3.5–5.1)
Sodium: 140 mmol/L (ref 135–145)

## 2014-11-15 MED ORDER — IOHEXOL 350 MG/ML SOLN
100.0000 mL | Freq: Once | INTRAVENOUS | Status: AC | PRN
  Administered 2014-11-15: 100 mL via INTRAVENOUS

## 2014-11-15 NOTE — ED Notes (Signed)
MD at bedside. 

## 2014-11-15 NOTE — ED Notes (Signed)
Patient transported to CT 

## 2014-11-15 NOTE — ED Provider Notes (Signed)
CSN: [id number]     Arrival date & time 11/15/14  1757 History  This chart was scribed for Tanna Furry, MD by Julien Nordmann, ED Scribe. This patient was seen in room MH05/MH05 and the patient's care was started at [facility] PM.    Chief Complaint  Patient presents with  . Leg Pain     The history is provided by the patient. No language interpreter was used.    HPI Comments: Levi [name] is a 69 y.o. male who presents to the Emergency Department complaining of constant, gradual worsening left calf pain onset one week ago. Pt notes swelling in his leg in the past 4 days. Pt notes he was seen by his PCP and was told that his d-dimer levels were elevated and was told to be evaluated by the ED. Pt notes he exercises every day and notes the pain has limited his activity the past 2 days. He notes having to use inhalers for his lungs due to frequent lung infections. Pt denies chest pain, hx of blood clots, any more shortness of breath than usual. He is a non-smoker.    Past Medical History  Diagnosis Date  . Asthma   . ALLERGIC RHINITIS   . Hyperlipidemia     takes Lipitor daily  . Agent orange exposure 1970's    takes Imdur,Diltiazem, and Atacand daily  . Joint pain   . Joint swelling   . Elevated uric acid in blood     takes Allopurinol daily  . Bruises easily     d/t prednisone daily  . GERD (gastroesophageal reflux disease)     takes Protonix daily  . Diverticulitis   . Enlarged prostate     but not on any meds  . History of pneumonia     a. 2010  . Chronic bronchitis     "used to get it q yr; last time ?2008" (04/28/2012)  . H/O hiatal hernia   . Degenerative disk disease     "qwhere" (04/28/2012)  . Personal history of colonic adenomas 02/09/2013  . PAF (paroxysmal atrial fibrillation)     a. Dx 04/2012, CHA2DS2VASc = 1 (age);  b. 04/2012 Echo: EF 40-50%, mild MR.  [name] Dysrhythmia     afib  . COPD (chronic obstructive pulmonary disease)     agent orange exposure   Past Surgical  History  Procedure Laterality Date  . Knee surgery    . Gallbladder surgery    . Total hip arthroplasty  2011    "left" (04/28/2012)  . Lateral / posterior combined fusion lumbar spine  2012  . Joint replacement    . Total knee arthroplasty  01/10/2012    Procedure: TOTAL KNEE ARTHROPLASTY;  Surgeon: [name], MD;  Location: [facility];  Service: Orthopedics;;  left total knee arthroplasty  . Cardiac catheterization  11/2008    [name] - 20% calcified non flow limiting left main, 50% EF apical hypokinesis  . Knee arthroscopy      "had them both scoped a bunch of times" (12/07/27/2011)  . Colonoscopy    . Esophagogastroduodenoscopy    . Reverse shoulder arthroplasty  04/28/2012    left  . Tonsillectomy and adenoidectomy  1954  . Cholecystectomy  1990's  . Reverse shoulder arthroplasty  04/28/2012    Procedure: REVERSE SHOULDER ARTHROPLASTY;  Surgeon: [name], MD;  Location: [facility];  Service: Orthopedics;  Laterality: Left;  Left shouder reverse total shoulder arthroplasty  . Shoulder surgery    .  Anterior cervical decomp/discectomy fusion N/A 07/21/2012    Procedure: ANTERIOR CERVICAL DECOMPRESSION/DISCECTOMY FUSION 2 LEVELS;  Surgeon: [name], MD;  Location: [facility];  Service: Neurosurgery;  Laterality: N/A;  C4-5 C5-6 Anterior cervical decompression/diskectomy/fusion  . Eye surgery  2011    "bilaterally; steroidal encapsulation" (04/28/2012)  . Total knee arthroplasty Right 06/17/2014    dr graves  . Total knee arthroplasty Right 06/17/2014    Procedure: TOTAL KNEE ARTHROPLASTY;  Surgeon: [name], MD;  Location: [facility];  Service: Orthopedics;  Laterality: Right;   Family History  Problem Relation Age of Onset  . Heart disease Father     Died 74, MI  . Prostate cancer Brother    History  Substance Use Topics  . Smoking status: Never Smoker   . Smokeless tobacco: Never Used  . Alcohol Use: No    Review of Systems  Constitutional: Negative for fever,  chills, diaphoresis, appetite change and fatigue.  HENT: Negative for mouth sores, sore throat and trouble swallowing.   Eyes: Negative for visual disturbance.  Respiratory: Negative for cough, chest tightness, shortness of breath and wheezing.   Cardiovascular: Negative for chest pain.  Gastrointestinal: Negative for nausea, vomiting, abdominal pain, diarrhea and abdominal distention.  Endocrine: Negative for polydipsia, polyphagia and polyuria.  Genitourinary: Negative for dysuria, frequency and hematuria.  Musculoskeletal: Negative for gait problem.  Skin: Negative for color change, pallor and rash.  Neurological: Negative for dizziness, syncope, light-headedness and headaches.  Hematological: Does not bruise/bleed easily.  Psychiatric/Behavioral: Negative for behavioral problems and confusion.      Allergies  Morphine and related and Levaquin  Home Medications   Prior to Admission medications   Medication Sig Start Date End Date Taking? Authorizing Provider  aspirin EC 81 MG tablet Take 81 mg by mouth daily.   Yes Historical Provider, MD  pirbuterol (MAXAIR) 200 MCG/INH inhaler Inhale 2 puffs into the lungs 4 (four) times daily.   Yes Historical Provider, MD  allopurinol (ZYLOPRIM) 100 MG tablet Take 100 mg by mouth daily.      Historical Provider, MD  atorvastatin (LIPITOR) 10 MG tablet TAKE 1 TABLET (10 MG TOTAL) BY MOUTH DAILY. 06/13/14   Jerline Pain, MD  beclomethasone (QVAR) 80 MCG/ACT inhaler Inhale 2 puffs into the lungs 2 (two) times daily. 07/20/13   Kathee Delton, MD  candesartan (ATACAND) 4 MG tablet TAKE 0.5 TABLETS (2 MG TOTAL) BY MOUTH DAILY. 08/16/14   Larey Dresser, MD  diltiazem (DILACOR XR) 180 MG 24 hr capsule TAKE 1 CAPSULE (180 MG TOTAL) BY MOUTH DAILY. 10/18/14   Jerline Pain, MD  formoterol (FORADIL AEROLIZER) 12 MCG capsule for inhaler PLACE 1 CAPSULE (12 MCG TOTAL) INTO INHALER AND INHALE 2 (TWO) TIMES DAILY. 10/03/14   Kathee Delton, MD  isosorbide  mononitrate (IMDUR) 30 MG 24 hr tablet TAKE 1 TABLET (30 MG TOTAL) BY MOUTH DAILY. 08/16/14   Larey Dresser, MD  Olodaterol HCl (STRIVERDI RESPIMAT) 2.5 MCG/ACT AERS Inhale 2 puffs into the lungs daily. 10/12/14   Kathee Delton, MD  pantoprazole (PROTONIX) 40 MG tablet TAKE 1 TABLET BY MOUTH TWICE A DAY Patient taking differently: Take 40 mg by mouth 2 (two) times daily.  01/17/14   Kathee Delton, MD  predniSONE (DELTASONE) 10 MG tablet Take 10 mg by mouth daily. continuous    Historical Provider, MD  predniSONE (DELTASONE) 10 MG tablet TAKE 1 TABLET BY MOUTH EVERY DAY 11/07/14   Clinton D  Young, MD  PROVENTIL HFA 108 (90 BASE) MCG/ACT inhaler INHALE 2 PUFFS INTO THE LUNGS EVERY 4 (FOUR) HOURS AS NEEDED FOR WHEEZING. 06/06/14   Kathee Delton, MD   Triage vitals: BP 161/71 mmHg  Pulse 68  Temp(Src) 98.4 F (36.9 C) (Oral)  Resp 18  Ht 6\' 2"  (1.88 m)  Wt 155 lb (70.308 kg)  BMI 19.89 kg/m2  SpO2 100% Physical Exam  Constitutional: He is oriented to person, place, and time. He appears well-developed and well-nourished. No distress.  HENT:  Head: Normocephalic.  Eyes: Conjunctivae are normal. Pupils are equal, round, and reactive to light. No scleral icterus.  Neck: Normal range of motion. Neck supple. No thyromegaly present.  Cardiovascular: Normal rate and regular rhythm.  Exam reveals no gallop and no friction rub.   No murmur heard. Pulmonary/Chest: Effort normal and breath sounds normal. No respiratory distress. He has no wheezes. He has no rales.  Abdominal: Soft. Bowel sounds are normal. He exhibits no distension. There is no tenderness. There is no rebound.  Musculoskeletal: Normal range of motion.  1+ edema to mid calf to ankle, mild pedal edema  Neurological: He is alert and oriented to person, place, and time.  Skin: Skin is warm and dry. No rash noted.  Psychiatric: He has a normal mood and affect. His behavior is normal.  Nursing note and vitals reviewed.   ED Course   Procedures  DIAGNOSTIC STUDIES: Oxygen Saturation is 100% on RA, normal by my interpretation.  COORDINATION OF CARE:  6:19 PM Discussed treatment plan which includes CTA with pt at bedside and pt agreed to plan.  Labs Review Labs Reviewed  CBC WITH DIFFERENTIAL/PLATELET - Abnormal; Notable for the following:    Hemoglobin 12.1 (*)    HCT 37.1 (*)    RDW 18.7 (*)    Monocytes Relative 1 (*)    All other components within normal limits  BASIC METABOLIC PANEL - Abnormal; Notable for the following:    Glucose, Bld 122 (*)    BUN 22 (*)    All other components within normal limits    Imaging Review Ct Angio Chest Pe W/cm &/or Wo Cm  11/15/2014   CLINICAL DATA:  Patient with left calf pain for 6 days. Elevated D-dimer. Shortness of breath.  EXAM: CT ANGIOGRAPHY CHEST WITH CONTRAST  TECHNIQUE: Multidetector CT imaging of the chest was performed using the standard protocol during bolus administration of intravenous contrast. Multiplanar CT image reconstructions and MIPs were obtained to evaluate the vascular anatomy.  CONTRAST:  165mL OMNIPAQUE IOHEXOL 350 MG/ML SOLN  COMPARISON:  Chest radiograph 06/10/2013  FINDINGS: Streak artifact from left shoulder hardware limits evaluation of the upper chest.  Mediastinum/Nodes: Visualized thyroid is unremarkable. No right axillary lymphadenopathy. Normal heart size. No pericardial effusion. Aorta and main pulmonary artery normal in caliber. Coronary arterial vascular calcifications.  Adequate opacification of the main pulmonary artery. No evidence for pulmonary embolism.  Lungs/Pleura: Central airways are patent. Dependent atelectasis within the left lower lobe. No consolidative or nodular pulmonary opacities. No pleural effusion or pneumothorax.  Upper abdomen: Incompletely visualized probable cyst within the left kidney, demonstrated on CT abdomen 02/13/2012.  Musculoskeletal: Incompletely visualized lumbar spinal fusion hardware. Thoracic spine  degenerative changes. Left shoulder arthroplasty.  Review of the MIP images confirms the above findings.  IMPRESSION: No evidence for pulmonary embolism.   Electronically Signed   By: Lovey Newcomer M.D.   On: 11/15/2014 19:13   US Venous Img Lower Unilateral Left  11/15/2014   CLINICAL DATA:  Left lower extremity pain and swelling for 1 week  EXAM: LEFT LOWER EXTREMITY VENOUS DUPLEX ULTRASOUND  TECHNIQUE: Gray-scale sonography with graded compression, as well as color Doppler and duplex ultrasound were performed to evaluate the left lower extremity deep venous system from the level of the common femoral vein and including the common femoral, femoral, profunda femoral, popliteal and calf veins including the posterior tibial, peroneal and gastrocnemius veins when visible. The superficial great saphenous vein was also interrogated. Spectral Doppler was utilized to evaluate flow at rest and with distal augmentation maneuvers in the common femoral, femoral and popliteal veins.  COMPARISON:  None.  FINDINGS: Contralateral Common Femoral Vein: Respiratory phasicity is normal and symmetric with the symptomatic side. No evidence of thrombus. Normal compressibility.  Common Femoral Vein: No evidence of thrombus. Normal compressibility, respiratory phasicity and response to augmentation.  Saphenofemoral Junction: No evidence of thrombus. Normal compressibility and flow on color Doppler imaging.  Profunda Femoral Vein: No evidence of thrombus. Normal compressibility and flow on color Doppler imaging.  Femoral Vein: No evidence of thrombus. Normal compressibility, respiratory phasicity and response to augmentation.  Popliteal Vein: No evidence of thrombus. Normal compressibility, respiratory phasicity and response to augmentation.  Calf Veins: No evidence of thrombus. Normal compressibility and flow on color Doppler imaging.  Superficial Great Saphenous Vein: No evidence of thrombus. Normal compressibility and flow on color  Doppler imaging.  Venous Reflux:  None.  Other Findings: Between the knee and ankle, there are multiple areas of fluid tracking in the superficial soft tissues as well as tracking between several more superficial muscle layers.  IMPRESSION: No evidence of left lower extremity deep venous thrombosis. Right common femoral vein patent. Fluid tracks in the superficial soft tissues in between several muscle layers between the knee in the ankle. This fluid probably is due to edema, although infected fluid potentially could present in this manner. No well-defined abscess is appreciable on this study.   Electronically Signed   By: Lowella Grip III M.D.   On: 11/15/2014 14:13     EKG Interpretation None      MDM   Final diagnoses:  SOB (shortness of breath)  Muscular pain   Negative ultrasound for DVT, however soft submuscular fluid noted., negative CT angiogram. Pain is medial left upper calf. This may be plantaris muscle or tendon injury. Plan is use as tolerated. Elevation. Primary care follow-up.  I personally performed the services described in this documentation, which was scribed in my presence. The recorded information has been reviewed and is accurate.   Tanna Furry, MD 11/15/14 [phone]

## 2014-11-15 NOTE — Discharge Instructions (Signed)
Exercise, and use of the leg as tolerated.  Follow up with your primary care physician as needed.

## 2014-11-15 NOTE — ED Notes (Signed)
Pt reports LLE pain in calf and swelling to left leg. Sts went to PMD, blood was drawn and had [place] of extremity today. Report from [place] was negative but told to come to ED for eval of elevated d-dimer. Pt denies CP.

## 2014-11-15 NOTE — ED Notes (Signed)
Pt amb to room 5 with quick steady gait in nad. Pt reports left calf pain x 6 days, seen at his pcp office today, sent her for outpatient dopplers, which were neg, pt states his pcp told him his d-dimer was elevated and to come to ed for eval. Pt denies any chest pain, when asked if he feels sob, states "no more than I usually do". ekg performed, iv access obtained while pt being triaged.

## 2014-11-21 ENCOUNTER — Ambulatory Visit (INDEPENDENT_AMBULATORY_CARE_PROVIDER_SITE_OTHER): Payer: Medicare Other

## 2014-11-21 ENCOUNTER — Other Ambulatory Visit: Payer: Self-pay

## 2014-11-21 DIAGNOSIS — J309 Allergic rhinitis, unspecified: Secondary | ICD-10-CM | POA: Diagnosis not present

## 2014-11-29 ENCOUNTER — Ambulatory Visit (INDEPENDENT_AMBULATORY_CARE_PROVIDER_SITE_OTHER): Payer: Medicare Other

## 2014-11-29 DIAGNOSIS — J309 Allergic rhinitis, unspecified: Secondary | ICD-10-CM | POA: Diagnosis not present

## 2014-12-08 ENCOUNTER — Ambulatory Visit (INDEPENDENT_AMBULATORY_CARE_PROVIDER_SITE_OTHER): Payer: Medicare Other

## 2014-12-08 DIAGNOSIS — J309 Allergic rhinitis, unspecified: Secondary | ICD-10-CM

## 2014-12-13 ENCOUNTER — Telehealth: Payer: Self-pay | Admitting: Cardiology

## 2014-12-13 DIAGNOSIS — Z87891 Personal history of nicotine dependence: Secondary | ICD-10-CM | POA: Diagnosis not present

## 2014-12-13 DIAGNOSIS — Z79899 Other long term (current) drug therapy: Secondary | ICD-10-CM | POA: Diagnosis not present

## 2014-12-13 DIAGNOSIS — I480 Paroxysmal atrial fibrillation: Secondary | ICD-10-CM | POA: Diagnosis not present

## 2014-12-13 DIAGNOSIS — Z885 Allergy status to narcotic agent status: Secondary | ICD-10-CM | POA: Diagnosis not present

## 2014-12-13 DIAGNOSIS — R079 Chest pain, unspecified: Secondary | ICD-10-CM | POA: Diagnosis not present

## 2014-12-13 DIAGNOSIS — Z881 Allergy status to other antibiotic agents status: Secondary | ICD-10-CM | POA: Diagnosis not present

## 2014-12-13 DIAGNOSIS — K219 Gastro-esophageal reflux disease without esophagitis: Secondary | ICD-10-CM | POA: Diagnosis not present

## 2014-12-13 DIAGNOSIS — J449 Chronic obstructive pulmonary disease, unspecified: Secondary | ICD-10-CM | POA: Diagnosis not present

## 2014-12-13 DIAGNOSIS — J45909 Unspecified asthma, uncomplicated: Secondary | ICD-10-CM | POA: Diagnosis not present

## 2014-12-13 DIAGNOSIS — Z7982 Long term (current) use of aspirin: Secondary | ICD-10-CM | POA: Diagnosis not present

## 2014-12-13 NOTE — Telephone Encounter (Signed)
Three hours ago while sitting there developed irregular heart rate.  Not sure what the heart rate is , feels 2-3 beats a second and it is irregular.  Also complains of feeling dizzy and light headed  Says it occurs a lot but usually doesn't last this long and when it has he usually ends up being admitted.  Instructed patient that he needs to call 9-1-1 because of shortness of breath and dizziness with his irregular heart rate of unknown rate   Patient agrees with plan

## 2014-12-13 NOTE — Telephone Encounter (Signed)
Patient c/o Palpitations:  High priority if patient c/o lightheadedness and shortness of breath.  1. How long have you been having palpitations? Irregular heart beats for a couple of hours maybe 3 hours   2. Are you currently experiencing lightheadedness and shortness of breath?Yes   3. Have you checked your BP and heart rate? (document readings) NO   4. Are you experiencing any other symptoms? No other symptoms

## 2014-12-14 ENCOUNTER — Encounter (HOSPITAL_COMMUNITY): Payer: Self-pay | Admitting: Nurse Practitioner

## 2014-12-14 ENCOUNTER — Ambulatory Visit (HOSPITAL_COMMUNITY)
Admission: RE | Admit: 2014-12-14 | Discharge: 2014-12-14 | Disposition: A | Discharge status: Home / Self Care | Payer: Medicare Other | Source: Ambulatory Visit | Attending: Nurse Practitioner | Admitting: Nurse Practitioner

## 2014-12-14 ENCOUNTER — Other Ambulatory Visit (HOSPITAL_COMMUNITY): Payer: Self-pay | Admitting: *Deleted

## 2014-12-14 VITALS — BP 110/68 | HR 73 | Ht 74.0 in | Wt 159.8 lb

## 2014-12-14 DIAGNOSIS — I480 Paroxysmal atrial fibrillation: Secondary | ICD-10-CM | POA: Diagnosis not present

## 2014-12-14 MED ORDER — DILTIAZEM HCL 30 MG PO TABS: ORAL_TABLET | ORAL | Status: DC

## 2014-12-14 NOTE — Telephone Encounter (Signed)
Follow Up  Pt wife calling to make appt w/ [name]. Pt wife stated pt, on our office recommendation, went to the [facility] in [place]. Per pt wife, pt is not doing any better and has to be seen this week, since they are leaving to go on vacation this weekend. Please call back and discuss.

## 2014-12-14 NOTE — Patient Instructions (Signed)
Your physician has recommended you make the following change in your medication:  1)Cardizem 30mg  take 1 tablet every 4 hours AS NEEDED for afib heart rate >100 and blood pressure >100.

## 2014-12-14 NOTE — Telephone Encounter (Signed)
I spoke with the pt's wife and the pt went to the ER yesterday and per Care Everywhere the pt was in AFib with RVR, pulse 113.  Per the wife the pt was monitored for a while and was given Ativan to help calm him down.  She states his pulse came down but remained in AFib and the ER advised him to follow-up with Cardiology.  Today the pt woke up "not feeling right" and "irregular pulse". The pt's BP this morning was 109/78, pulse 78. I will discuss this pt with the DOD/Flex provider.

## 2014-12-14 NOTE — Progress Notes (Signed)
Patient ID: Levi [name], male   DOB: 01-Nov-1945, 69 y.o.   MRN: [id number]     Primary Care Physician: Orpah Melter, MD Referring Physician: Dr. Baldwin Jamaica is a 69 y.o. male with a h/o PAF diagnosed 12/13 with low afib burden controlled with diltiazem 180 mg daily and on asa for chadsvasc score of 1(age). He woke up in afib yesterday am and called the [address] office for advice and told him to proceed to the ER due to his complaints of being dizzy with a HR of 110. He went to [facility] in [place] and was given ativan. He went home in afib but when he was exercising this am, he returned to [place].   In the office today, EKG shows SR. He was sleepy yesterday from ativan, but is back to his usual self today. He reports it is very rare for him to be in afib more than a few minutes but seldom will have a spell that lasts more 1-2 hours. He does not smoke, use alcohol, no snoring, is not overweight and exercises daily. Minimal caffeine use.He does have lung disease with prior exposure to agent orange, copd/asthma/bronchitis.  Today, he denies symptoms of palpitations, chest pain, shortness of breath, orthopnea, PND, lower extremity edema, dizziness, presyncope, syncope, or neurologic sequela. The patient is tolerating medications without difficulties and is otherwise without complaint today.   Past Medical History  Diagnosis Date  . Asthma   . ALLERGIC RHINITIS   . Hyperlipidemia     takes Lipitor daily  . Agent orange exposure 1970's    takes Imdur,Diltiazem, and Atacand daily  . Joint pain   . Joint swelling   . Elevated uric acid in blood     takes Allopurinol daily  . Bruises easily     d/t prednisone daily  . GERD (gastroesophageal reflux disease)     takes Protonix daily  . Diverticulitis   . Enlarged prostate     but not on any meds  . History of pneumonia     a. 2010  . Chronic bronchitis     "used to get it q yr; last time ?2008" (04/28/2012)  . H/O  hiatal hernia   . Degenerative disk disease     "qwhere" (04/28/2012)  . Personal history of colonic adenomas 02/09/2013  . PAF (paroxysmal atrial fibrillation)     a. Dx 04/2012, CHA2DS2VASc = 1 (age);  b. 04/2012 Echo: EF 40-50%, mild MR.  [name] Dysrhythmia     afib  . COPD (chronic obstructive pulmonary disease)     agent orange exposure   Past Surgical History  Procedure Laterality Date  . Knee surgery    . Gallbladder surgery    . Total hip arthroplasty  2011    "left" (04/28/2012)  . Lateral / posterior combined fusion lumbar spine  2012  . Joint replacement    . Total knee arthroplasty  01/10/2012    Procedure: TOTAL KNEE ARTHROPLASTY;  Surgeon: [name], MD;  Location: [facility];  Service: Orthopedics;;  left total knee arthroplasty  . Cardiac catheterization  11/2008    [name] - 20% calcified non flow limiting left main, 50% EF apical hypokinesis  . Knee arthroscopy      "had them both scoped a bunch of times" (12/07/27/2011)  . Colonoscopy    . Esophagogastroduodenoscopy    . Reverse shoulder arthroplasty  04/28/2012    left  . Tonsillectomy and adenoidectomy  1954  .  Cholecystectomy  1990's  . Reverse shoulder arthroplasty  04/28/2012    Procedure: REVERSE SHOULDER ARTHROPLASTY;  Surgeon: [name], MD;  Location: [facility];  Service: Orthopedics;  Laterality: Left;  Left shouder reverse total shoulder arthroplasty  . Shoulder surgery    . Anterior cervical decomp/discectomy fusion N/A 07/21/2012    Procedure: ANTERIOR CERVICAL DECOMPRESSION/DISCECTOMY FUSION 2 LEVELS;  Surgeon: [name], MD;  Location: [facility];  Service: Neurosurgery;  Laterality: N/A;  C4-5 C5-6 Anterior cervical decompression/diskectomy/fusion  . Eye surgery  2011    "bilaterally; steroidal encapsulation" (04/28/2012)  . Total knee arthroplasty Right 06/17/2014    dr graves  . Total knee arthroplasty Right 06/17/2014    Procedure: TOTAL KNEE ARTHROPLASTY;  Surgeon: [name], MD;   Location: [facility];  Service: Orthopedics;  Laterality: Right;    Current Outpatient Prescriptions  Medication Sig Dispense Refill  . allopurinol (ZYLOPRIM) 100 MG tablet Take 100 mg by mouth daily.      [name] aspirin EC 81 MG tablet Take 81 mg by mouth daily.    [name] atorvastatin (LIPITOR) 10 MG tablet TAKE 1 TABLET (10 MG TOTAL) BY MOUTH DAILY. 30 tablet 3  . beclomethasone (QVAR) 80 MCG/ACT inhaler Inhale 2 puffs into the lungs 2 (two) times daily. 1 Inhaler 3  . diltiazem (DILACOR XR) 180 MG 24 hr capsule TAKE 1 CAPSULE (180 MG TOTAL) BY MOUTH DAILY. 30 capsule 5  . formoterol (FORADIL AEROLIZER) 12 MCG capsule for inhaler PLACE 1 CAPSULE (12 MCG TOTAL) INTO INHALER AND INHALE 2 (TWO) TIMES DAILY. 60 capsule 11  . isosorbide mononitrate (IMDUR) 30 MG 24 hr tablet TAKE 1 TABLET (30 MG TOTAL) BY MOUTH DAILY. 30 tablet 1  . Olodaterol HCl (STRIVERDI RESPIMAT) 2.5 MCG/ACT AERS Inhale 2 puffs into the lungs daily. 1 Inhaler 5  . pantoprazole (PROTONIX) 40 MG tablet TAKE 1 TABLET BY MOUTH TWICE A DAY (Patient taking differently: Take 40 mg by mouth 2 (two) times daily. ) 60 tablet 11  . pirbuterol (MAXAIR) 200 MCG/INH inhaler Inhale 2 puffs into the lungs 4 (four) times daily as needed.     . predniSONE (DELTASONE) 10 MG tablet Take 10 mg by mouth daily. continuous    . PROVENTIL HFA 108 (90 BASE) MCG/ACT inhaler INHALE 2 PUFFS INTO THE LUNGS EVERY 4 (FOUR) HOURS AS NEEDED FOR WHEEZING. 6.7 each 3  . candesartan (ATACAND) 4 MG tablet TAKE 0.5 TABLETS (2 MG TOTAL) BY MOUTH DAILY. 30 tablet 2  . diltiazem (CARDIZEM) 30 MG tablet Take 1 tablet every 4 hours AS NEEDED for afib HR >100 and BP >100 30 tablet 0   No current facility-administered medications for this encounter.    Allergies  Allergen Reactions  . Morphine And Related Other (See Comments)    Light headed and sweaty; "my heart races" (04/28/2012)  . Levaquin [Levofloxacin] Itching    History   Social History  . Marital Status: Married     Spouse Name: N/A  . Number of Children: N/A  . Years of Education: N/A   Occupational History  . 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      [place] vet   Social History Main Topics  . Smoking status: Never Smoker   . Smokeless tobacco: Never Used  . Alcohol Use: No  . Drug Use: No  . Sexual Activity: Yes   Other Topics Concern  . 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 Heart disease Father     Died 74, MI  . Prostate cancer Brother     ROS- All systems are reviewed and negative except as per the HPI above  Physical Exam: Filed Vitals:   12/14/14 1100  BP: 110/68  Pulse: 73  Height: 6\' 2"  (1.88 m)  Weight: 159 lb 12.8 oz (72.485 kg)    GEN- The patient is well appearing, alert and oriented x 3 today.   Head- normocephalic, atraumatic Eyes-  Sclera clear, conjunctiva pink Ears- hearing intact Oropharynx- clear Neck- supple, no JVP Lymph- no cervical lymphadenopathy Lungs- Clear to ausculation bilaterally, normal work of breathing Heart- Regular rate and rhythm, no murmurs, rubs or gallops, PMI not laterally displaced GI- soft, NT, ND, + BS Extremities- no clubbing, cyanosis, or edema MS- no significant deformity or atrophy Skin- no rash or lesion Psych- euthymic mood, full affect Neuro- strength and sensation are intact  EKG-NSR, Rt atrial enlargement,Pr int 150 ms, QRS int 80 ms, QTc 394 ms,  Assessment and Plan:  1.PAF Afib yesterday for 8- 10 hours but this is very unusual for pt.  Cardizem 30 mg tab given as a pill in pocket to use for breakthrough episodes. He currently feels that afib burden is low and does not want to move on to AAD therapy. Continue cardizem 180 mg daily.  2. Chadsvasc score of 1 Discussed stroke risk  and he still wants to stay on baby asa which is according to guidelines.  F/u with Dr. Ofilia Neas clinic as needed.

## 2014-12-14 NOTE — Telephone Encounter (Signed)
Discussed pt with Levi Bayley NP and he would like the pt seen today in the AFib Clinic.  Pt scheduled for appointment today at 11:00 and he is aware to bring a current list of his medications.

## 2014-12-16 ENCOUNTER — Ambulatory Visit (INDEPENDENT_AMBULATORY_CARE_PROVIDER_SITE_OTHER): Payer: Medicare Other

## 2014-12-16 DIAGNOSIS — J309 Allergic rhinitis, unspecified: Secondary | ICD-10-CM

## 2014-12-21 ENCOUNTER — Telehealth: Payer: Self-pay | Admitting: Internal Medicine

## 2014-12-21 ENCOUNTER — Ambulatory Visit (INDEPENDENT_AMBULATORY_CARE_PROVIDER_SITE_OTHER): Payer: Medicare Other

## 2014-12-21 DIAGNOSIS — J309 Allergic rhinitis, unspecified: Secondary | ICD-10-CM

## 2014-12-21 NOTE — Telephone Encounter (Signed)
Date Mixed: 12/21/14 Vial: 1    Strength: 1:10 Here/Mail/Pick Up: here Mixed By: tbs

## 2014-12-26 ENCOUNTER — Encounter: Payer: Self-pay | Admitting: Internal Medicine

## 2014-12-26 ENCOUNTER — Ambulatory Visit (INDEPENDENT_AMBULATORY_CARE_PROVIDER_SITE_OTHER): Payer: Medicare Other

## 2014-12-26 ENCOUNTER — Ambulatory Visit (INDEPENDENT_AMBULATORY_CARE_PROVIDER_SITE_OTHER): Payer: Medicare Other | Admitting: Internal Medicine

## 2014-12-26 VITALS — BP 124/76 | HR 56 | Ht 74.0 in | Wt 161.6 lb

## 2014-12-26 DIAGNOSIS — J449 Chronic obstructive pulmonary disease, unspecified: Secondary | ICD-10-CM

## 2014-12-26 DIAGNOSIS — J301 Allergic rhinitis due to pollen: Secondary | ICD-10-CM | POA: Diagnosis not present

## 2014-12-26 DIAGNOSIS — J309 Allergic rhinitis, unspecified: Secondary | ICD-10-CM | POA: Diagnosis not present

## 2014-12-26 NOTE — Patient Instructions (Signed)
Order- schedule PFT   Dx asthma with COPD (use only 2 puffs of albuterol HFA due to Afib)  Make the change froM foradil to Striverdi as planned  Continue allergy vaccine  1:10

## 2014-12-26 NOTE — Progress Notes (Signed)
Subjective:    Patient ID: Levi [name], male    DOB: [birth year], 69 y.o.   MRN: [id number]  12/24/10- 62 yoM never smoker followed for allergy/ asthma, rhinitis by Dr Annamaria Boots, for asthma/ hx ABPA by Dr Gwenette Greet Last here June 25, 2010 Last visit he had a rash on his hand. That resolved with topical steroid, but has recently recurred in the same area on dorsum right hand. Allergy vaccine- 1:10 GH Had more asthma at end of April- saw Dr Gwenette Greet. Maintenance prednisone was briefly increased, now back to 5 mg daily. Has needed rescue inhaler only 2-3x since then. Occasional stuffy nose- uses saline nasal rinse.  CXR Feb, 2012- clear, NAD.   06/26/11- 64 yoM never smoker followed for allergy/ asthma, rhinitis by Dr Annamaria Boots, for asthma/ hx ABPA by Dr Gwenette Greet He was itching and sneezing a few days ago but that resolved. He had a temperature up to 100.3 with sweating at night, light green sputum, raspy crackle in his chest. He felt that he had a cold starting to get better. Denies sore throat sinus pain or chest pain. Continues allergy vaccine without problems and is satisfied that it is helpful. Continues prednisone maintenance 5 mg daily. Has not had significant exacerbation of wheezing with his recent acute illness.  12/27/11- 64 yoM never smoker followed for allergy/ asthma, rhinitis by Dr Annamaria Boots, for asthma/ hx ABPA by Dr Gwenette Greet Doing well on vaccine and denies any trouble/flare ups at this time Continue vaccine 1:10 GH. Spring was difficult season, with some tightness, but doing well now.  Always some postnasal drip and nose "floods" at times. Rare headache.  12/28/12- 64 yoM never smoker followed for allergy/ asthma, rhinitis by Dr Annamaria Boots, for asthma/ hx ABPA by Dr Gwenette Greet FOLLOWS FOR: still on vaccine 1:10 [place] and doing well. Says it helps. Maintenance prednisone 10 mg daily. In early summer needed more rescue inhaler blamed on air quality. Doing particularly well now. Doesn't remember experience  with Dymista or Flonase. Uses occasional Zyrtec. Has a chronic septal perforation so we agreed not to use any nasal spray now.  12/24/13- 67 yoM never smoker followed for allergy/ asthma with COPD, rhinitis by Dr Annamaria Boots, for asthma/ hx ABPA by Dr Gwenette Greet FOLLOWS FOR: still on allergy vaccine 1:10 GH and doing well Has seen Dr Gwenette Greet for exacerbations requiring prednisone burst.  He says he is generally "better off" on allergy shots Does worst in spring season and with current humidity. Not needing nasal spray. Likes saline rinse by squirt bottle.HPI  10/03/14- Dr Gwenette Greet The patient comes in today for an acute sick visit. He has known chronic obstructive asthma and likely ABPA.  He has been on long-standing prednisone at a very low dose, and is also on allergy vaccine. He has done well on this regimen overall. He comes in today with increasing shortness of breath that started last week, but is actually improved over the weekend. He denies any chest congestion, but is having a lot of postnasal drip with hoarseness. He does cough up brown looking mucus in small quantities, and thinks it is coming from his throat. He is not having sinus pressure or significant congestion. It should also be noted that he has not been taking his antihistamine on a regular basis.   12/26/14- 68 yoM never smoker followed for allergy/ asthma, rhinitis by Dr Annamaria Boots, for asthma/ hx ABPA by Dr Gwenette Greet, complicated by PAFib FOLLOWS FOR: FOLLOWS FOR: Still on vaccine 1:10 GH and no trouble  with allergies. Pt states he was recently in hospital for afib(corrected) and continues to have trouble with breathing. Insurance would not cover [place]. Feels bilateral maxillary and frontal pressure with little discharge. Cough productive clear mucus. Using rescue inhaler once or twice a week, continues prednisone 10 mg daily maintenance. Insurance change Foradil to [employer]. CTa chest 11/15/14 IMPRESSION: No evidence for pulmonary  embolism. Electronically Signed  By: Lovey Newcomer M.D.  On: 11/15/2014 19:13  ROS-see HPI   Negative unless "+" Constitutional:    weight loss, night sweats, fevers, chills, fatigue, lassitude. HEENT:    headaches, difficulty swallowing, tooth/dental problems, sore throat,       sneezing, itching, ear ache, nasal congestion, post nasal drip, snoring CV:    chest pain, orthopnea, PND, swelling in lower extremities, anasarca,                                                    dizziness, palpitations Resp:   shortness of breath with exertion or at rest.                productive cough,   non-productive cough, coughing up of blood.              change in color of mucus.  wheezing.   Skin:    rash or lesions. GI:  No-   heartburn, indigestion, abdominal pain, nausea, vomiting, diarrhea,                 change in bowel habits, loss of appetite GU: dysuria, change in color of urine, no urgency or frequency.   flank pain. MS:   joint pain, stiffness, decreased range of motion, back pain. Neuro-     nothing unusual Psych:  change in mood or affect.  depression or anxiety.   memory loss.    Objective:  OBJ- Physical Exam General- Alert, Oriented, Affect-appropriate, Distress- none acute Skin- rash-none, lesions- none, excoriation- none Lymphadenopathy- none Head- atraumatic            Eyes- Gross vision intact, PERRLA, conjunctivae and secretions clear            Ears- Hearing, canals-normal            Nose- Clear, no-Septal dev, mucus, polyps, erosion, perforation             Throat- Mallampati II , mucosa clear , drainage- none, tonsils- atrophic Neck- flexible , trachea midline, no stridor , thyroid nl, carotid no bruit Chest - symmetrical excursion , unlabored           Heart/CV- RRR , no murmur , no gallop  , no rub, nl s1 s2                           - JVD- none , edema- none, stasis changes- none, varices- none           Lung- clear to P&A, wheeze- none, cough- none , dullness-none,  rub- none           Chest wall-  Abd-  Br/ Gen/ Rectal- Not done, not indicated Extrem- cyanosis- none, clubbing, none, atrophy- none, strength- nl Neuro- grossly intact to observation    Assessment & Plan:

## 2014-12-27 ENCOUNTER — Other Ambulatory Visit: Payer: Self-pay | Admitting: Cardiology

## 2014-12-27 ENCOUNTER — Other Ambulatory Visit: Payer: Self-pay | Admitting: Internal Medicine

## 2014-12-27 NOTE — Telephone Encounter (Signed)
Received refill request for pred 10 mg QD. Did not see mention in OV note from 12/26/14 yet for this to be continued. Please advise Dr. Annamaria Boots if okay to refill? thanks

## 2014-12-27 NOTE — Telephone Encounter (Signed)
Ok to refill 

## 2014-12-30 ENCOUNTER — Other Ambulatory Visit: Payer: Self-pay | Admitting: *Deleted

## 2014-12-30 MED ORDER — PANTOPRAZOLE SODIUM 40 MG PO TBEC: DELAYED_RELEASE_TABLET | ORAL | Status: DC

## 2015-01-03 ENCOUNTER — Ambulatory Visit (INDEPENDENT_AMBULATORY_CARE_PROVIDER_SITE_OTHER): Payer: Medicare Other

## 2015-01-03 DIAGNOSIS — J309 Allergic rhinitis, unspecified: Secondary | ICD-10-CM

## 2015-01-04 ENCOUNTER — Ambulatory Visit (INDEPENDENT_AMBULATORY_CARE_PROVIDER_SITE_OTHER): Payer: Medicare Other | Admitting: Internal Medicine

## 2015-01-04 DIAGNOSIS — J449 Chronic obstructive pulmonary disease, unspecified: Secondary | ICD-10-CM

## 2015-01-04 LAB — PULMONARY FUNCTION TEST
DL/VA % pred: 92 %
DL/VA: 4.47 ml/min/mmHg/L
DLCO unc % pred: 77 %
DLCO unc: 29.64 ml/min/mmHg
FEF 25-75 Post: 2.54 L/sec
FEF 25-75 Pre: 2.32 L/sec
FEF2575-%Change-Post: 9 %
FEF2575-%Pred-Post: 84 %
FEF2575-%Pred-Pre: 76 %
FEV1-%Change-Post: 2 %
FEV1-%Pred-Post: 83 %
FEV1-%Pred-Pre: 81 %
FEV1-Post: 3.28 L
FEV1-Pre: 3.2 L
FEV1FVC-%Change-Post: -2 %
FEV1FVC-%Pred-Pre: 99 %
FEV6-%Change-Post: 5 %
FEV6-%Pred-Post: 89 %
FEV6-%Pred-Pre: 85 %
FEV6-Post: 4.52 L
FEV6-Pre: 4.29 L
FEV6FVC-%Change-Post: 0 %
FEV6FVC-%Pred-Post: 104 %
FEV6FVC-%Pred-Pre: 104 %
FVC-%Change-Post: 4 %
FVC-%Pred-Post: 86 %
FVC-%Pred-Pre: 81 %
FVC-Post: 4.55 L
FVC-Pre: 4.34 L
Post FEV1/FVC ratio: 72 %
Post FEV6/FVC ratio: 99 %
Pre FEV1/FVC ratio: 74 %
Pre FEV6/FVC Ratio: 99 %
RV % pred: 51 %
RV: 1.37 L
TLC % pred: 75 %
TLC: 5.96 L

## 2015-01-04 NOTE — Progress Notes (Signed)
PFT done today. 

## 2015-01-09 ENCOUNTER — Ambulatory Visit (INDEPENDENT_AMBULATORY_CARE_PROVIDER_SITE_OTHER): Payer: Medicare Other

## 2015-01-09 DIAGNOSIS — J309 Allergic rhinitis, unspecified: Secondary | ICD-10-CM

## 2015-01-10 ENCOUNTER — Ambulatory Visit: Payer: Medicare Other

## 2015-01-12 ENCOUNTER — Telehealth: Payer: Self-pay | Admitting: Internal Medicine

## 2015-01-12 NOTE — Telephone Encounter (Signed)
Wife reports that the patient has had 4 episodes of rectal bleeding over the last 4-6 weeks. She states yesterday was a large amount of bleeding.  He has gone to the gym this am.  I have scheduled him to see Tye Savoy RNP tomorrow at 1:30.

## 2015-01-13 ENCOUNTER — Other Ambulatory Visit (INDEPENDENT_AMBULATORY_CARE_PROVIDER_SITE_OTHER): Payer: Medicare Other

## 2015-01-13 ENCOUNTER — Encounter: Payer: Self-pay | Admitting: Nurse Practitioner

## 2015-01-13 ENCOUNTER — Ambulatory Visit (INDEPENDENT_AMBULATORY_CARE_PROVIDER_SITE_OTHER): Payer: Medicare Other | Admitting: Nurse Practitioner

## 2015-01-13 VITALS — BP 120/60 | HR 60 | Ht 73.25 in | Wt 154.4 lb

## 2015-01-13 DIAGNOSIS — K625 Hemorrhage of anus and rectum: Secondary | ICD-10-CM

## 2015-01-13 LAB — CBC
HCT: 39.6 % (ref 39.0–52.0)
Hemoglobin: 12.9 g/dL — ABNORMAL LOW (ref 13.0–17.0)
MCHC: 32.5 g/dL (ref 30.0–36.0)
MCV: 88.7 fl (ref 78.0–100.0)
Platelets: 231 10*3/uL (ref 150.0–400.0)
RBC: 4.46 Mil/uL (ref 4.22–5.81)
RDW: 17.2 % — ABNORMAL HIGH (ref 11.5–15.5)
WBC: 8.6 10*3/uL (ref 4.0–10.5)

## 2015-01-13 NOTE — Progress Notes (Signed)
     History of Present Illness:   Patient is 69 year old male known to Dr. Carlean Purl. He was seen late August 2014 with bowel changes, lower cramps. Colonoscopy was performed September 2014 with removal of 3 sessile polyps (adenomatous) and findings of severe diverticulosis of the sigmoid colon. Surveillance colonoscopy due September 2017.   Patient comes in for evaluation of rectal bleeding. Numerous times over the last 4 weeks patient has seen dark red blood mixed in his stool. Four days ago he passed a small blood clot in his bowel movement. No abdominal pain or rectal pain.    Current Medications, Allergies, Past Medical History, Past Surgical History, Family History and Social History were reviewed in [employer] record.  Physical Exam: General: Pleasant, thin white male in no acute distress Head: Normocephalic and atraumatic Eyes:  sclerae anicteric, conjunctiva pink  Ears: Normal auditory acuity Lungs: Clear throughout to auscultation Heart: Regular rate and rhythm Abdomen: Soft, non distended, non-tender. No masses, no hepatomegaly. Normal bowel sounds Rectal: no external hemorrhoids or fissures seen. DRE unremarkable.  No internal hemorrhoids or other abnormalities on anoscopy Musculoskeletal: Symmetrical with no gross deformities  Extremities: No edema  Neurological: Alert oriented x 4, grossly nonfocal Psychological:  Alert and cooperative. Normal mood and affect  Assessment and Recommendations:  Pleasant 69 year old male with a 4 week history of intermittent painless rectal bleeding with bowel movements (dark red blood). Nothing on digital rectal exam or anoscopy to account for the bleeding. His last colonoscopy was only 2 years ago but I think it's necessary to repeat the exam to sort this out, especially in absence of any perianal findings. The risks, benefits, and alternatives to colonoscopy with possible biopsy and possible polypectomy were discussed  with the patient and he consents to proceed. Obtain CBC today.

## 2015-01-13 NOTE — Patient Instructions (Signed)
Please go to the basement level to have your labs drawn.   You have been scheduled for a colonoscopy. Please follow written instructions given to you at your visit today.  Please pick up your prep supplies at the pharmacy within the next 1-3 days. If you use inhalers (even only as needed), please bring them with you on the day of your procedure. Your physician has requested that you go to www.startemmi.com and enter the access code given to you at your visit today. This web site gives a general overview about your procedure. However, you should still follow specific instructions given to you by our office regarding your preparation for the procedure. 

## 2015-01-15 ENCOUNTER — Encounter: Payer: Self-pay | Admitting: Nurse Practitioner

## 2015-01-15 DIAGNOSIS — K625 Hemorrhage of anus and rectum: Secondary | ICD-10-CM | POA: Insufficient documentation

## 2015-01-16 ENCOUNTER — Ambulatory Visit (INDEPENDENT_AMBULATORY_CARE_PROVIDER_SITE_OTHER): Payer: Medicare Other

## 2015-01-16 DIAGNOSIS — J309 Allergic rhinitis, unspecified: Secondary | ICD-10-CM | POA: Diagnosis not present

## 2015-01-16 NOTE — Progress Notes (Signed)
Agree with Ms. Guenther's assessment and plan. Elizeth Weinrich E. Teja Costen, MD, FACG   

## 2015-01-18 ENCOUNTER — Encounter: Payer: Self-pay | Admitting: Internal Medicine

## 2015-01-20 ENCOUNTER — Telehealth: Payer: Self-pay | Admitting: Internal Medicine

## 2015-01-20 NOTE — Telephone Encounter (Signed)
I called and spoke with the pt and notified of recs per CDY  He verbalized understanding and nothing further needed

## 2015-01-20 NOTE — Progress Notes (Signed)
Quick Note:  Spoke with pt and notified of results per Dr. Young. Pt verbalized understanding and denied any questions.  ______ 

## 2015-01-23 ENCOUNTER — Ambulatory Visit (INDEPENDENT_AMBULATORY_CARE_PROVIDER_SITE_OTHER): Payer: Medicare Other

## 2015-01-23 ENCOUNTER — Other Ambulatory Visit: Payer: Self-pay | Admitting: Internal Medicine

## 2015-01-23 DIAGNOSIS — J309 Allergic rhinitis, unspecified: Secondary | ICD-10-CM

## 2015-01-23 NOTE — Telephone Encounter (Signed)
CY Please advise on refill; medication we have on file is different from Rx refill request.Thanks.

## 2015-01-23 NOTE — Telephone Encounter (Signed)
Ok to refill 

## 2015-01-25 NOTE — Telephone Encounter (Signed)
Ok to refill 

## 2015-01-27 ENCOUNTER — Encounter: Payer: Self-pay | Admitting: Internal Medicine

## 2015-01-27 ENCOUNTER — Ambulatory Visit (AMBULATORY_SURGERY_CENTER): Payer: Medicare Other | Admitting: Internal Medicine

## 2015-01-27 VITALS — BP 124/65 | HR 52 | Temp 96.1°F | Resp 25 | Ht 73.5 in | Wt 154.0 lb

## 2015-01-27 DIAGNOSIS — K5730 Diverticulosis of large intestine without perforation or abscess without bleeding: Secondary | ICD-10-CM | POA: Diagnosis not present

## 2015-01-27 DIAGNOSIS — J449 Chronic obstructive pulmonary disease, unspecified: Secondary | ICD-10-CM | POA: Diagnosis not present

## 2015-01-27 DIAGNOSIS — D123 Benign neoplasm of transverse colon: Secondary | ICD-10-CM

## 2015-01-27 DIAGNOSIS — K625 Hemorrhage of anus and rectum: Secondary | ICD-10-CM | POA: Diagnosis present

## 2015-01-27 DIAGNOSIS — I471 Supraventricular tachycardia: Secondary | ICD-10-CM | POA: Diagnosis not present

## 2015-01-27 DIAGNOSIS — D122 Benign neoplasm of ascending colon: Secondary | ICD-10-CM

## 2015-01-27 MED ORDER — PREDNISONE 5 MG PO TABS
ORAL_TABLET | ORAL | Status: DC
Start: 2015-01-27 — End: 2015-04-24

## 2015-01-27 MED ORDER — SODIUM CHLORIDE 0.9 % IV SOLN: 500.0000 mL | INTRAVENOUS | Status: DC

## 2015-01-27 NOTE — Op Note (Addendum)
[facility]  [address] [place], [id number]   COLONOSCOPY PROCEDURE REPORT  PATIENT: Levi [name], Levi [name]  MR#: [id number] BIRTHDATE: Dec 10, 1945 , 66  yrs. old GENDER: male ENDOSCOPIST: Gatha Mayer, MD, [facility] PROCEDURE DATE:  01/27/2015 PROCEDURE:   Colonoscopy, diagnostic and Colonoscopy with biopsy First Screening Colonoscopy - Avg.  risk and is 50 yrs.  old or older - No.  Prior Negative Screening - Now for repeat screening. N/A  History of Adenoma - Now for follow-up colonoscopy & has been > or = to 3 yrs.  N/A  Polyps removed today? Yes ASA CLASS:   Class III INDICATIONS:Evaluation of unexplained GI bleeding and Patient is not applicable for Colorectal Neoplasm Risk Assessment for this procedure. MEDICATIONS: Propofol 300 mg IV and Monitored anesthesia care  DESCRIPTION OF PROCEDURE:   After the risks benefits and alternatives of the procedure were thoroughly explained, informed consent was obtained.  The digital rectal exam revealed no abnormalities of the rectum, revealed no prostatic nodules, and revealed the prostate was not enlarged.   The LB [id number] endoscope was introduced through the anus and advanced to the cecum, which was identified by both the appendix and ileocecal valve. No adverse events experienced.   The quality of the prep was good.  (MiraLax was used)  The instrument was then slowly withdrawn as the colon was fully examined. Estimated blood loss is zero unless otherwise noted in this procedure report.  COLON FINDINGS: Two polypoid shaped sessile polyps ranging from 2 to 73mm in size were found in the ascending colon and transverse colon. Polypectomies were performed with cold forceps.  The resection was complete, the polyp tissue was completely retrieved and sent to histology.   There was severe diverticulosis noted in the sigmoid colon with associated muscular hypertrophy and luminal narrowing. The examination was otherwise  normal.  Retroflexed views revealed no abnormalities. The time to cecum = 6.0 Withdrawal time = 11.2 The scope was withdrawn and the procedure completed. COMPLICATIONS: There were no immediate complications.  ENDOSCOPIC IMPRESSION: 1.   Two sessile polyps ranging from 2 to 90mm in size were found in the ascending colon and transverse colon; polypectomies were performed with cold forceps 2.   There was severe diverticulosis noted in the sigmoid colon 3.   The examination was otherwise normal - good prep - hx adenomas  RECOMMENDATIONS: 1.  Timing of repeat colonoscopy will be determined by pathology findings. 2.  See me in office next available - [facility] making appt- sxs seem hemorrhoidal - likely repeat anoscopy 3.  Cancel 01/2016 recall will reset after this pathology in  eSigned:  Gatha Mayer, MD, [facility] 01/27/2015 12:12 PM Revised: 01/27/2015 12:12 PM  cc: Orpah Melter, MD and The Patient

## 2015-01-27 NOTE — Addendum Note (Signed)
Addended by: Lorane Gell on: 01/27/2015 03:15 PM   Modules accepted: Orders

## 2015-01-27 NOTE — Progress Notes (Signed)
Called to room to assist during endoscopic procedure.  Patient ID and intended procedure confirmed with present staff. Received instructions for my participation in the procedure from the performing physician.  

## 2015-01-27 NOTE — Patient Instructions (Addendum)
I found 2 tiny polyps that are benign. You have diverticulosis - thickened muscle rings and pouches in the colon wall. Please read the handout about this condition.  I did not see hemorrhoids - not always easy at colonoscopy but think this is your problem. If you have persistent problems I would like to examine you (myself).  I will let you know pathology results and when to have another routine colonoscopy by mail.  I appreciate the opportunity to care for you. Gatha Mayer, MD, FACG  YOU HAD AN ENDOSCOPIC PROCEDURE TODAY AT [facility]:   Refer to the procedure report that was given to you for any specific questions about what was found during the examination.  If the procedure report does not answer your questions, please call your gastroenterologist to clarify.  If you requested that your care partner not be given the details of your procedure findings, then the procedure report has been included in a sealed envelope for you to review at your convenience later.  YOU SHOULD EXPECT: Some feelings of bloating in the abdomen. Passage of more gas than usual.  Walking can help get rid of the air that was put into your GI tract during the procedure and reduce the bloating. If you had a lower endoscopy (such as a colonoscopy or flexible sigmoidoscopy) you may notice spotting of blood in your stool or on the toilet paper. If you underwent a bowel prep for your procedure, you may not have a normal bowel movement for a few days.  Please Note:  You might notice some irritation and congestion in your nose or some drainage.  This is from the oxygen used during your procedure.  There is no need for concern and it should clear up in a day or so.  SYMPTOMS TO REPORT IMMEDIATELY:   Following lower endoscopy (colonoscopy or flexible sigmoidoscopy):  Excessive amounts of blood in the stool  Significant tenderness or worsening of abdominal pains  Swelling of the abdomen that is  new, acute  Fever of 100F or higher  For urgent or emergent issues, a gastroenterologist can be reached at any hour by calling [phone].   DIET: Your first meal following the procedure should be a small meal and then it is ok to progress to your normal diet. Heavy or fried foods are harder to digest and may make you feel nauseous or bloated.  Likewise, meals heavy in dairy and vegetables can increase bloating.  Drink plenty of fluids but you should avoid alcoholic beverages for 24 hours.  ACTIVITY:  You should plan to take it easy for the rest of today and you should NOT DRIVE or use heavy machinery until tomorrow (because of the sedation medicines used during the test).    FOLLOW UP: Our staff will call the number listed on your records the next business day following your procedure to check on you and address any questions or concerns that you may have regarding the information given to you following your procedure. If we do not reach you, we will leave a message.  However, if you are feeling well and you are not experiencing any problems, there is no need to return our call.  We will assume that you have returned to your regular daily activities without incident.  If any biopsies were taken you will be contacted by phone or by letter within the next 1-3 weeks.  Please call us at [phone] if you have not heard about  the biopsies in 3 weeks.    SIGNATURES/CONFIDENTIALITY: You and/or your care partner have signed paperwork which will be entered into your electronic medical record.  These signatures attest to the fact that that the information above on your After Visit Summary has been reviewed and is understood.  Full responsibility of the confidentiality of this discharge information lies with you and/or your care-partner.  Please read over handouts about polyps and diverticulosis

## 2015-01-27 NOTE — Progress Notes (Signed)
To recovery, report to [place], RN, VSS 

## 2015-01-31 ENCOUNTER — Ambulatory Visit (INDEPENDENT_AMBULATORY_CARE_PROVIDER_SITE_OTHER): Payer: Medicare Other

## 2015-01-31 ENCOUNTER — Telehealth: Payer: Self-pay | Admitting: Emergency Medicine

## 2015-01-31 DIAGNOSIS — J309 Allergic rhinitis, unspecified: Secondary | ICD-10-CM

## 2015-01-31 NOTE — Telephone Encounter (Signed)
Left message, identifier present 

## 2015-02-03 DIAGNOSIS — N401 Enlarged prostate with lower urinary tract symptoms: Secondary | ICD-10-CM | POA: Diagnosis not present

## 2015-02-04 NOTE — Assessment & Plan Note (Signed)
Feels mild nasal/sinus pressure but can manage with nasal saline, nasal steroid, antihistamine. Continuing allergy vaccine.

## 2015-02-04 NOTE — Assessment & Plan Note (Signed)
No recent exacerbation. Only occasionally needs rescue inhaler but is remaining on maintenance prednisone 10 mg. We discussed use of sustained action adrenergic bronchodilator given history of atrial fibrillation, but rate control seems good.

## 2015-02-05 ENCOUNTER — Encounter: Payer: Self-pay | Admitting: Internal Medicine

## 2015-02-05 DIAGNOSIS — Z86010 Personal history of colonic polyps: Secondary | ICD-10-CM

## 2015-02-05 NOTE — Progress Notes (Signed)
Quick Note:  2 diminutive adenomas - repeat colon 2021 ______ 

## 2015-02-06 ENCOUNTER — Ambulatory Visit (INDEPENDENT_AMBULATORY_CARE_PROVIDER_SITE_OTHER): Payer: Medicare Other

## 2015-02-06 ENCOUNTER — Other Ambulatory Visit: Payer: Self-pay | Admitting: Cardiology

## 2015-02-06 DIAGNOSIS — J309 Allergic rhinitis, unspecified: Secondary | ICD-10-CM | POA: Diagnosis not present

## 2015-02-10 DIAGNOSIS — N401 Enlarged prostate with lower urinary tract symptoms: Secondary | ICD-10-CM | POA: Diagnosis not present

## 2015-02-10 DIAGNOSIS — N138 Other obstructive and reflux uropathy: Secondary | ICD-10-CM | POA: Diagnosis not present

## 2015-02-13 ENCOUNTER — Ambulatory Visit (INDEPENDENT_AMBULATORY_CARE_PROVIDER_SITE_OTHER): Payer: Medicare Other

## 2015-02-13 DIAGNOSIS — J309 Allergic rhinitis, unspecified: Secondary | ICD-10-CM

## 2015-02-13 DIAGNOSIS — Z23 Encounter for immunization: Secondary | ICD-10-CM

## 2015-02-20 ENCOUNTER — Ambulatory Visit (INDEPENDENT_AMBULATORY_CARE_PROVIDER_SITE_OTHER): Payer: Medicare Other

## 2015-02-20 DIAGNOSIS — J309 Allergic rhinitis, unspecified: Secondary | ICD-10-CM

## 2015-02-21 NOTE — Progress Notes (Signed)
This encounter was created in error - please disregard.

## 2015-02-27 ENCOUNTER — Ambulatory Visit (INDEPENDENT_AMBULATORY_CARE_PROVIDER_SITE_OTHER): Payer: Medicare Other

## 2015-02-27 DIAGNOSIS — J309 Allergic rhinitis, unspecified: Secondary | ICD-10-CM | POA: Diagnosis not present

## 2015-03-01 DIAGNOSIS — M4326 Fusion of spine, lumbar region: Secondary | ICD-10-CM | POA: Diagnosis not present

## 2015-03-01 DIAGNOSIS — M5442 Lumbago with sciatica, left side: Secondary | ICD-10-CM | POA: Diagnosis not present

## 2015-03-03 ENCOUNTER — Encounter: Payer: Self-pay | Admitting: Internal Medicine

## 2015-03-03 ENCOUNTER — Other Ambulatory Visit: Payer: Self-pay | Admitting: Neurological Surgery

## 2015-03-03 ENCOUNTER — Ambulatory Visit (INDEPENDENT_AMBULATORY_CARE_PROVIDER_SITE_OTHER): Payer: Medicare Other | Admitting: Internal Medicine

## 2015-03-03 VITALS — BP 110/60 | HR 64 | Ht 73.5 in | Wt 156.8 lb

## 2015-03-03 DIAGNOSIS — K625 Hemorrhage of anus and rectum: Secondary | ICD-10-CM | POA: Diagnosis not present

## 2015-03-03 DIAGNOSIS — M5432 Sciatica, left side: Secondary | ICD-10-CM

## 2015-03-03 NOTE — Patient Instructions (Signed)
   Glad to hear that you aren't bleeding.  Call me if you have more problems.  I appreciate the opportunity to care for you. Gatha Mayer, MD, [facility]

## 2015-03-03 NOTE — Progress Notes (Signed)
   Subjective:    Patient ID: [name], male    DOB: Jul 28, 1945, 69 y.o.   MRN: [id number] Cc: f/u rectal bleeding HPI The patient was seen in August with rectal bleeding/hematochezia. He had a colonoscopy - polyps and diverticulosis. No bleeding sice then. Medications, allergies, past medical history, past surgical history, family history and social history are reviewed and updated in the EMR.  Review of Systems Back pain - getting tready for another surgery w/ [name].    Objective:   Physical Exam BP 110/60 mmHg  Pulse 64  Ht 6' 1.5" (1.867 m)  Wt 156 lb 12.8 oz (71.124 kg)  BMI 20.40 kg/m2     Assessment & Plan:  Rectal bleeding This has resolved. It could've been diverticular. Hemorrhoids remained possibilities well. His colonoscopy showed polyps and diverticulosis and he has not had any further bleeding. Will observe he knows to call me as needed.  I appreciate the opportunity to care for this patient. CC: Orpah Melter, MD

## 2015-03-06 ENCOUNTER — Ambulatory Visit (INDEPENDENT_AMBULATORY_CARE_PROVIDER_SITE_OTHER): Payer: Medicare Other

## 2015-03-06 DIAGNOSIS — J309 Allergic rhinitis, unspecified: Secondary | ICD-10-CM | POA: Diagnosis not present

## 2015-03-07 ENCOUNTER — Ambulatory Visit
Admission: RE | Admit: 2015-03-07 | Discharge: 2015-03-07 | Disposition: A | Discharge status: Home / Self Care | Payer: Medicare Other | Source: Ambulatory Visit | Attending: Neurological Surgery | Admitting: Neurological Surgery

## 2015-03-07 DIAGNOSIS — M5432 Sciatica, left side: Secondary | ICD-10-CM

## 2015-03-07 DIAGNOSIS — M4326 Fusion of spine, lumbar region: Secondary | ICD-10-CM | POA: Diagnosis not present

## 2015-03-09 DIAGNOSIS — G8929 Other chronic pain: Secondary | ICD-10-CM | POA: Diagnosis not present

## 2015-03-13 ENCOUNTER — Ambulatory Visit (INDEPENDENT_AMBULATORY_CARE_PROVIDER_SITE_OTHER): Payer: Medicare Other

## 2015-03-13 DIAGNOSIS — J309 Allergic rhinitis, unspecified: Secondary | ICD-10-CM

## 2015-03-17 DIAGNOSIS — M2550 Pain in unspecified joint: Secondary | ICD-10-CM | POA: Diagnosis not present

## 2015-03-17 DIAGNOSIS — M109 Gout, unspecified: Secondary | ICD-10-CM | POA: Diagnosis not present

## 2015-03-20 ENCOUNTER — Ambulatory Visit (INDEPENDENT_AMBULATORY_CARE_PROVIDER_SITE_OTHER): Payer: Medicare Other

## 2015-03-20 DIAGNOSIS — J309 Allergic rhinitis, unspecified: Secondary | ICD-10-CM

## 2015-03-21 ENCOUNTER — Other Ambulatory Visit: Payer: Self-pay | Admitting: *Deleted

## 2015-03-21 MED ORDER — OLODATEROL HCL 2.5 MCG/ACT IN AERS: 2.0000 | INHALATION_SPRAY | Freq: Every day | RESPIRATORY_TRACT | Status: DC

## 2015-03-22 DIAGNOSIS — M67371 Transient synovitis, right ankle and foot: Secondary | ICD-10-CM | POA: Diagnosis not present

## 2015-03-22 DIAGNOSIS — M7752 Other enthesopathy of left foot: Secondary | ICD-10-CM | POA: Diagnosis not present

## 2015-03-22 DIAGNOSIS — M1990 Unspecified osteoarthritis, unspecified site: Secondary | ICD-10-CM | POA: Diagnosis not present

## 2015-03-22 DIAGNOSIS — M7751 Other enthesopathy of right foot: Secondary | ICD-10-CM | POA: Diagnosis not present

## 2015-03-22 DIAGNOSIS — M659 Synovitis and tenosynovitis, unspecified: Secondary | ICD-10-CM | POA: Diagnosis not present

## 2015-03-27 ENCOUNTER — Ambulatory Visit (INDEPENDENT_AMBULATORY_CARE_PROVIDER_SITE_OTHER): Payer: Medicare Other

## 2015-03-27 DIAGNOSIS — J309 Allergic rhinitis, unspecified: Secondary | ICD-10-CM

## 2015-03-28 ENCOUNTER — Other Ambulatory Visit: Payer: Self-pay | Admitting: Cardiology

## 2015-03-29 ENCOUNTER — Other Ambulatory Visit: Payer: Self-pay | Admitting: Cardiology

## 2015-03-29 DIAGNOSIS — M7751 Other enthesopathy of right foot: Secondary | ICD-10-CM | POA: Diagnosis not present

## 2015-03-29 DIAGNOSIS — M1990 Unspecified osteoarthritis, unspecified site: Secondary | ICD-10-CM | POA: Diagnosis not present

## 2015-04-03 ENCOUNTER — Other Ambulatory Visit: Payer: Self-pay | Admitting: Cardiology

## 2015-04-03 ENCOUNTER — Ambulatory Visit (INDEPENDENT_AMBULATORY_CARE_PROVIDER_SITE_OTHER): Payer: Medicare Other

## 2015-04-03 DIAGNOSIS — J309 Allergic rhinitis, unspecified: Secondary | ICD-10-CM | POA: Diagnosis not present

## 2015-04-10 ENCOUNTER — Ambulatory Visit (INDEPENDENT_AMBULATORY_CARE_PROVIDER_SITE_OTHER): Payer: Medicare Other

## 2015-04-10 DIAGNOSIS — J309 Allergic rhinitis, unspecified: Secondary | ICD-10-CM

## 2015-04-13 ENCOUNTER — Other Ambulatory Visit: Payer: Self-pay | Admitting: Internal Medicine

## 2015-04-17 ENCOUNTER — Other Ambulatory Visit: Payer: Self-pay | Admitting: *Deleted

## 2015-04-17 ENCOUNTER — Ambulatory Visit (INDEPENDENT_AMBULATORY_CARE_PROVIDER_SITE_OTHER): Payer: Medicare Other

## 2015-04-17 ENCOUNTER — Telehealth: Payer: Self-pay | Admitting: Internal Medicine

## 2015-04-17 DIAGNOSIS — J309 Allergic rhinitis, unspecified: Secondary | ICD-10-CM | POA: Diagnosis not present

## 2015-04-17 NOTE — Telephone Encounter (Signed)
Allergy Serum Extract Date Mixed: 04/17/15 Vial: 1 Strength: 1:10 Here/Mail/Pick Up: here  Mixed By: tbs Last OV: 12/26/14 Pending OV: 04/27/15

## 2015-04-18 ENCOUNTER — Ambulatory Visit (INDEPENDENT_AMBULATORY_CARE_PROVIDER_SITE_OTHER): Payer: Medicare Other

## 2015-04-18 DIAGNOSIS — J309 Allergic rhinitis, unspecified: Secondary | ICD-10-CM

## 2015-04-24 ENCOUNTER — Ambulatory Visit (INDEPENDENT_AMBULATORY_CARE_PROVIDER_SITE_OTHER): Payer: Medicare Other

## 2015-04-24 ENCOUNTER — Ambulatory Visit (INDEPENDENT_AMBULATORY_CARE_PROVIDER_SITE_OTHER): Payer: Medicare Other | Admitting: Cardiology

## 2015-04-24 ENCOUNTER — Encounter: Payer: Self-pay | Admitting: Cardiology

## 2015-04-24 VITALS — BP 126/58 | HR 58 | Ht 74.0 in | Wt 156.1 lb

## 2015-04-24 DIAGNOSIS — I480 Paroxysmal atrial fibrillation: Secondary | ICD-10-CM

## 2015-04-24 DIAGNOSIS — J309 Allergic rhinitis, unspecified: Secondary | ICD-10-CM | POA: Diagnosis not present

## 2015-04-24 NOTE — Patient Instructions (Signed)

## 2015-04-24 NOTE — Progress Notes (Signed)
[facility]. [address], [address] [place],   [address] Phone: [phone] Fax:  [phone]  Date:  04/24/2015   ID:  Levi [name], DOB June 09, 1945, MRN [id number]  PCP:  Orpah Melter, MD   History of Present Illness: Levi [name] is a 69 y.o. male paroxysmal atrial fibrillation in December of 2013 here for followup.  PAF-low-dose Cardizem is being utilized. Echocardiogram demonstrated EF in the 40-50% range. Prior catheterization in 2010 showed no flow-limiting CAD.  Baseline COPD.   CHADSVAS - 1 (age).  Occasional swelling in his ankles. When exercising, he may rarely feel a single pounding heartbeat. No shortness of breath, no syncope. He is had issues with his rods in his back. Long-term prednisone has made his bones frail.  Rare isolated pounding, PVC-like sensation. Occasional twinge in chest. Wife ran [employer].   Overall doing well. No further swelling.  He was seen in the atrial fibrillation clinic on 12/14/14. In review of their note, he was diagnosed back in December 2013 with low atrial fibrillation burden and on diltiazem 180 a day. He woke up in atrial fibrillation and was found to have a heart rate of 110 and felt dizzy. Went to the emergency room in [place], Ativan was given and when he was exercising he returned to sinus rhythm.his episode lasted for about 10 hours. Cardizem 30 mg was given for breakthrough episodes. He does not want to move onto antiarrhythmic therapy. Continue with low-dose aspirin because he has a score of 1.   Wt Readings from Last 3 Encounters:  04/24/15 156 lb 1.9 oz (70.816 kg)  03/03/15 156 lb 12.8 oz (71.124 kg)  01/27/15 154 lb (69.854 kg)     Past Medical History  Diagnosis Date  . Asthma   . ALLERGIC RHINITIS   . Hyperlipidemia     takes Lipitor daily  . Agent orange exposure 1970's    takes Imdur,Diltiazem, and Atacand daily  . Joint pain   . Joint swelling   . Elevated uric acid in blood    takes Allopurinol daily  . Bruises easily     d/t prednisone daily  . GERD (gastroesophageal reflux disease)     takes Protonix daily  . Diverticulitis   . Enlarged prostate     but not on any meds  . History of pneumonia     a. 2010  . Chronic bronchitis ([facility])     "used to get it q yr; last time ?2008" (04/28/2012)  . H/O hiatal hernia   . Degenerative disk disease     "qwhere" (04/28/2012)  . Personal history of colonic adenomas 02/09/2013  . PAF (paroxysmal atrial fibrillation) ([facility])     a. Dx 04/2012, CHA2DS2VASc = 1 (age);  b. 04/2012 Echo: EF 40-50%, mild MR.  [name] Dysrhythmia     afib  . COPD (chronic obstructive pulmonary disease) ([facility])     agent orange exposure    Past Surgical History  Procedure Laterality Date  . Cholecystectomy    . Total hip arthroplasty Left 2011    "left" (04/28/2012)  . Lateral / posterior combined fusion lumbar spine  2012  . Total knee arthroplasty  01/10/2012    Procedure: TOTAL KNEE ARTHROPLASTY;  Surgeon: [name], MD;  Location: [facility];  Service: Orthopedics;;  left total knee arthroplasty  . Cardiac catheterization  11/2008    [name] - 20% calcified non flow limiting left main, 50% EF apical  hypokinesis  . Knee arthroscopy Bilateral   . Colonoscopy    . Esophagogastroduodenoscopy    . Tonsillectomy and adenoidectomy  1954  . Reverse shoulder arthroplasty  04/28/2012    Procedure: REVERSE SHOULDER ARTHROPLASTY;  Surgeon: [name], MD;  Location: [facility];  Service: Orthopedics;  Laterality: Left;  Left shouder reverse total shoulder arthroplasty  . Shoulder surgery    . Anterior cervical decomp/discectomy fusion N/A 07/21/2012    Procedure: ANTERIOR CERVICAL DECOMPRESSION/DISCECTOMY FUSION 2 LEVELS;  Surgeon: [name], MD;  Location: [facility];  Service: Neurosurgery;  Laterality: N/A;  C4-5 C5-6 Anterior cervical decompression/diskectomy/fusion  . Eye surgery Bilateral 2011    steroidal encapsulation" (04/28/2012)  .  Total knee arthroplasty Right 06/17/2014    Procedure: TOTAL KNEE ARTHROPLASTY;  Surgeon: [name], MD;  Location: [facility];  Service: Orthopedics;  Laterality: Right;  . Back surgery      x 3  . Foot surgery Right     x 2    Current Outpatient Prescriptions  Medication Sig Dispense Refill  . allopurinol (ZYLOPRIM) 100 MG tablet Take 100 mg by mouth daily.      [name] aspirin EC 81 MG tablet Take 81 mg by mouth daily.    [name] atorvastatin (LIPITOR) 10 MG tablet TAKE 1 TABLET (10 MG TOTAL) BY MOUTH DAILY. 30 tablet 3  . beclomethasone (QVAR) 80 MCG/ACT inhaler Inhale 2 puffs into the lungs 2 (two) times daily. 1 Inhaler 3  . candesartan (ATACAND) 4 MG tablet TAKE 0.5 TABLETS (2 MG TOTAL) BY MOUTH DAILY. 30 tablet 2  . diltiazem (CARDIZEM CD) 180 MG 24 hr capsule TAKE 1 CAPSULE (180 MG TOTAL) BY MOUTH DAILY. 30 capsule 5  . diltiazem (CARDIZEM) 30 MG tablet Take 1 tablet every 4 hours AS NEEDED for afib HR >100 and BP >100 30 tablet 0  . isosorbide mononitrate (IMDUR) 30 MG 24 hr tablet TAKE 1 TABLET (30 MG TOTAL) BY MOUTH DAILY. 30 tablet 5  . NONFORMULARY OR COMPOUNDED ITEM Allergy Vaccine 1:10 Given at [facility]    . Olodaterol HCl (STRIVERDI RESPIMAT) 2.5 MCG/ACT AERS Inhale 2 puffs into the lungs daily. 1 Inhaler 5  . pantoprazole (PROTONIX) 40 MG tablet TAKE 1 TABLET BY MOUTH TWICE A DAY 60 tablet 11  . predniSONE (DELTASONE) 10 MG tablet Take 10 mg by mouth daily. continuous    . PROVENTIL HFA 108 (90 BASE) MCG/ACT inhaler INHALE 2 PUFFS INTO THE LUNGS EVERY 4 (FOUR) HOURS AS NEEDED FOR WHEEZING. 6.7 each 3   No current facility-administered medications for this visit.    Allergies:    Allergies  Allergen Reactions  . Morphine And Related Other (See Comments)    Light headed and sweaty; "my heart races" (04/28/2012)  . Levaquin [Levofloxacin] Itching    Social History:  The patient  reports that he has never smoked. He has never used smokeless tobacco. He reports that he  does not drink alcohol or use illicit drugs.   ROS:  Please see the history of present illness.      PHYSICAL EXAM: VS:  BP 126/58 mmHg  Pulse 58  Ht 6\' 2"  (1.88 m)  Wt 156 lb 1.9 oz (70.816 kg)  BMI 20.04 kg/m2 Well nourished, well developed, in no acute distress HEENT: normal Neck: no JVD Cardiac:  normal S1, S2; RRR; no murmur Lungs:  Somewhat distant breath sounds but overall adequate air movement, mildly barrel chested , no wheezing, rhonchi or rales Abd:  soft, nontender, no hepatomegaly Ext: no edema Skin: warm and dry Neuro: no focal abnormalities noted  EKG:  03/28/14-sinus rhythm, 68, no other abnormalities, previousSinus bradycardia rate 55 with no other abnormalities. Normal intervals. No change from prior.    Labs: 10/19/13-LDL 73  ASSESSMENT AND PLAN:  1. Paroxysmal atrial fibrillation-no recent symptomatic episodes. Continue with diltiazem 180. He has diltiazem 30 mg for breakthrough. He has not needed to take this. Certainly this can be contributing somewhat to his intermittent edema as a side effect. We will continue to monitor. No anticoagulation because of CHADSVAS 1.appreciate previous visit by atrial fibrillation clinic. 2. COPD-Dr. Annamaria Boots. Stable. 3. Occasional palpitations-described as a "pounding" single beat. This could be a PVC or PAC. No high-risk symptoms with this such as syncope, racing, shortness of breath. We will continue to monitor. It is been quite sometime since he is had another episode. 4. I will see him back in 6 months. 5. Continue with lipid modification. No changes made with statin.  Signed, Candee Furbish, MD [facility]  04/24/2015 4:12 PM

## 2015-04-27 ENCOUNTER — Encounter: Payer: Self-pay | Admitting: Internal Medicine

## 2015-04-27 ENCOUNTER — Ambulatory Visit (INDEPENDENT_AMBULATORY_CARE_PROVIDER_SITE_OTHER): Payer: Medicare Other | Admitting: Internal Medicine

## 2015-04-27 VITALS — BP 102/82 | HR 71 | Ht 74.0 in | Wt 154.6 lb

## 2015-04-27 DIAGNOSIS — Z23 Encounter for immunization: Secondary | ICD-10-CM | POA: Diagnosis not present

## 2015-04-27 DIAGNOSIS — J449 Chronic obstructive pulmonary disease, unspecified: Secondary | ICD-10-CM

## 2015-04-27 DIAGNOSIS — I480 Paroxysmal atrial fibrillation: Secondary | ICD-10-CM | POA: Diagnosis not present

## 2015-04-27 DIAGNOSIS — J45909 Unspecified asthma, uncomplicated: Secondary | ICD-10-CM | POA: Diagnosis not present

## 2015-04-27 DIAGNOSIS — J301 Allergic rhinitis due to pollen: Secondary | ICD-10-CM

## 2015-04-27 MED ORDER — PREDNISONE 10 MG PO TABS: 10.0000 mg | ORAL_TABLET | Freq: Every day | ORAL | Status: DC

## 2015-04-27 NOTE — Progress Notes (Signed)
Subjective:    Patient ID: Levi [name], male    DOB: [birth year], 69 y.o.   MRN: [id number]  12/24/10- 62 yoM never smoker followed for allergy/ asthma, rhinitis by Dr Annamaria Boots, for asthma/ hx ABPA by Dr Gwenette Greet Last here June 25, 2010 Last visit he had a rash on his hand. That resolved with topical steroid, but has recently recurred in the same area on dorsum right hand. Allergy vaccine- 1:10 [facility] Had more asthma at end of April- saw Dr Gwenette Greet. Maintenance prednisone was briefly increased, now back to 5 mg daily. Has needed rescue inhaler only 2-3x since then. Occasional stuffy nose- uses saline nasal rinse.  CXR Feb, 2012- clear, NAD.   06/26/11- 64 yoM never smoker followed for allergy/ asthma, rhinitis by Dr Annamaria Boots, for asthma/ hx ABPA by Dr Gwenette Greet He was itching and sneezing a few days ago but that resolved. He had a temperature up to 100.3 with sweating at night, light green sputum, raspy crackle in his chest. He felt that he had a cold starting to get better. Denies sore throat sinus pain or chest pain. Continues allergy vaccine without problems and is satisfied that it is helpful. Continues prednisone maintenance 5 mg daily. Has not had significant exacerbation of wheezing with his recent acute illness.  12/27/11- 64 yoM never smoker followed for allergy/ asthma, rhinitis by Dr Annamaria Boots, for asthma/ hx ABPA by Dr Gwenette Greet Doing well on vaccine and denies any trouble/flare ups at this time Continue vaccine 1:10 [facility]. Spring was difficult season, with some tightness, but doing well now.  Always some postnasal drip and nose "floods" at times. Rare headache.  12/28/12- 64 yoM never smoker followed for allergy/ asthma, rhinitis by Dr Annamaria Boots, for asthma/ hx ABPA by Dr Gwenette Greet FOLLOWS FOR: still on vaccine 1:10 [place] and doing well. Says it helps. Maintenance prednisone 10 mg daily. In early summer needed more rescue inhaler blamed on air quality. Doing particularly well now. Doesn't remember experience  with Dymista or Flonase. Uses occasional Zyrtec. Has a chronic septal perforation so we agreed not to use any nasal spray now.  12/24/13- 67 yoM never smoker followed for allergy/ asthma with COPD, rhinitis by Dr Annamaria Boots, for asthma/ hx ABPA by Dr Gwenette Greet FOLLOWS FOR: still on allergy vaccine 1:10 [facility] and doing well Has seen Dr Gwenette Greet for exacerbations requiring prednisone burst.  He says he is generally "better off" on allergy shots Does worst in spring season and with current humidity. Not needing nasal spray. Likes saline rinse by squirt bottle.HPI  10/03/14- Dr Gwenette Greet The patient comes in today for an acute sick visit. He has known chronic obstructive asthma and likely ABPA.  He has been on long-standing prednisone at a very low dose, and is also on allergy vaccine. He has done well on this regimen overall. He comes in today with increasing shortness of breath that started last week, but is actually improved over the weekend. He denies any chest congestion, but is having a lot of postnasal drip with hoarseness. He does cough up brown looking mucus in small quantities, and thinks it is coming from his throat. He is not having sinus pressure or significant congestion. It should also be noted that he has not been taking his antihistamine on a regular basis.   12/26/14- 68 yoM never smoker followed for allergy/ asthma, rhinitis by Dr Annamaria Boots, for asthma/ hx ABPA by Dr Gwenette Greet, complicated by PAFib FOLLOWS FOR: FOLLOWS FOR: Still on vaccine 1:10 [facility] and no trouble  with allergies. Pt states he was recently in hospital for afib(corrected) and continues to have trouble with breathing. Insurance would not cover [place]. Feels bilateral maxillary and frontal pressure with little discharge. Cough productive clear mucus. Using rescue inhaler once or twice a week, continues prednisone 10 mg daily maintenance. Insurance change [employer] to [employer]. CTa chest 11/15/14 IMPRESSION: No evidence for pulmonary  embolism. Electronically Signed  By: Lovey Newcomer M.D.  On: 11/15/2014 19:13   112/1/16- 69 year old male never smoker followed for allergy/asthma, rhinitis, history  ABPA, complicated by PAfib FOLLOW FOR: Asthma, Allergies; hard to breath lately x last 6 weeks.  Allergy Vaccine 1:10 [facility] PFT- 01/04/2015-minimal restriction, minimal diffusion defect, insignificant response to bronchodilator. FVC 4.55/86%, FEV1 3.28/83%, FEV1/FVC 0.72, TLC 75%, DLCO 77%. Some increased shortness of breath in the last 6 weeks with mild wheeze, little cough.  ROS-see HPI   Negative unless "+" Constitutional:    weight loss, night sweats, fevers, chills, fatigue, lassitude. HEENT:    headaches, difficulty swallowing, tooth/dental problems, sore throat,       sneezing, itching, ear ache, nasal congestion, post nasal drip, snoring CV:    chest pain, orthopnea, PND, swelling in lower extremities, anasarca,                                                    dizziness, palpitations Resp:   + shortness of breath with exertion or at rest.                productive cough,   non-productive cough, coughing up of blood.              change in color of mucus.  + wheezing.   Skin:    rash or lesions. GI:  No-   heartburn, indigestion, abdominal pain, nausea, vomiting, diarrhea,                 change in bowel habits, loss of appetite GU: dysuria, change in color of urine, no urgency or frequency.   flank pain. MS:   joint pain, stiffness, decreased range of motion, back pain. Neuro-     nothing unusual Psych:  change in mood or affect.  depression or anxiety.   memory loss.    Objective:  OBJ- Physical Exam General- Alert, Oriented, Affect-appropriate, Distress- none acute, tall/thin Skin- rash-none, lesions- none, excoriation- none Lymphadenopathy- none Head- atraumatic            Eyes- Gross vision intact, PERRLA, conjunctivae and secretions clear            Ears- Hearing, canals-normal            Nose- Clear,  no-Septal dev, mucus, polyps, erosion, perforation             Throat- Mallampati II , mucosa clear , drainage- none, tonsils- atrophic Neck- flexible , trachea midline, no stridor , thyroid nl, carotid no bruit Chest - symmetrical excursion , unlabored           Heart/CV- RRR , no murmur , no gallop  , no rub, nl s1 s2                           - JVD- none , edema- none, stasis changes- none, varices- none  Lung- clear to P&A, wheeze- none, cough- none , dullness-none, rub- none           Chest wall-  Abd-  Br/ Gen/ Rectal- Not done, not indicated Extrem- cyanosis- none, clubbing, none, atrophy- none, strength- nl Neuro- grossly intact to observation    Assessment & Plan:

## 2015-04-27 NOTE — Patient Instructions (Addendum)
Sample Anoro Ellipta   inhale 1 puff, once daily     Try this instead of Striverdi. When the Anoro sample is done, then go back to [employer].    We can continue allergy vaccine  Prevnar -13  pneumonia vaccine   This is a one-time shot

## 2015-05-01 ENCOUNTER — Ambulatory Visit (INDEPENDENT_AMBULATORY_CARE_PROVIDER_SITE_OTHER): Payer: Medicare Other

## 2015-05-01 DIAGNOSIS — J309 Allergic rhinitis, unspecified: Secondary | ICD-10-CM

## 2015-05-08 ENCOUNTER — Ambulatory Visit (INDEPENDENT_AMBULATORY_CARE_PROVIDER_SITE_OTHER): Payer: Medicare Other

## 2015-05-08 DIAGNOSIS — J309 Allergic rhinitis, unspecified: Secondary | ICD-10-CM | POA: Diagnosis not present

## 2015-05-09 DIAGNOSIS — M25561 Pain in right knee: Secondary | ICD-10-CM | POA: Diagnosis not present

## 2015-05-09 DIAGNOSIS — M25552 Pain in left hip: Secondary | ICD-10-CM | POA: Diagnosis not present

## 2015-05-09 DIAGNOSIS — M25562 Pain in left knee: Secondary | ICD-10-CM | POA: Diagnosis not present

## 2015-05-09 DIAGNOSIS — M79642 Pain in left hand: Secondary | ICD-10-CM | POA: Diagnosis not present

## 2015-05-10 DIAGNOSIS — M5416 Radiculopathy, lumbar region: Secondary | ICD-10-CM | POA: Diagnosis not present

## 2015-05-15 ENCOUNTER — Ambulatory Visit (INDEPENDENT_AMBULATORY_CARE_PROVIDER_SITE_OTHER): Payer: Medicare Other

## 2015-05-15 DIAGNOSIS — J309 Allergic rhinitis, unspecified: Secondary | ICD-10-CM | POA: Diagnosis not present

## 2015-05-23 ENCOUNTER — Ambulatory Visit (INDEPENDENT_AMBULATORY_CARE_PROVIDER_SITE_OTHER): Payer: Medicare Other

## 2015-05-23 DIAGNOSIS — J309 Allergic rhinitis, unspecified: Secondary | ICD-10-CM

## 2015-05-30 ENCOUNTER — Ambulatory Visit (INDEPENDENT_AMBULATORY_CARE_PROVIDER_SITE_OTHER): Payer: Medicare Other

## 2015-05-30 DIAGNOSIS — J309 Allergic rhinitis, unspecified: Secondary | ICD-10-CM

## 2015-06-01 DIAGNOSIS — M25552 Pain in left hip: Secondary | ICD-10-CM | POA: Diagnosis not present

## 2015-06-01 DIAGNOSIS — M25551 Pain in right hip: Secondary | ICD-10-CM | POA: Diagnosis not present

## 2015-06-05 ENCOUNTER — Ambulatory Visit: Payer: Medicare Other

## 2015-06-06 ENCOUNTER — Ambulatory Visit (INDEPENDENT_AMBULATORY_CARE_PROVIDER_SITE_OTHER): Payer: Medicare Other

## 2015-06-06 ENCOUNTER — Ambulatory Visit: Payer: Medicare Other

## 2015-06-06 DIAGNOSIS — J309 Allergic rhinitis, unspecified: Secondary | ICD-10-CM | POA: Diagnosis not present

## 2015-06-13 ENCOUNTER — Ambulatory Visit (INDEPENDENT_AMBULATORY_CARE_PROVIDER_SITE_OTHER): Payer: Medicare Other

## 2015-06-13 ENCOUNTER — Other Ambulatory Visit: Payer: Self-pay | Admitting: Cardiology

## 2015-06-13 ENCOUNTER — Telehealth: Payer: Self-pay | Admitting: Internal Medicine

## 2015-06-13 DIAGNOSIS — J309 Allergic rhinitis, unspecified: Secondary | ICD-10-CM | POA: Diagnosis not present

## 2015-06-13 MED ORDER — BECLOMETHASONE DIPROPIONATE 80 MCG/ACT IN AERS: 2.0000 | INHALATION_SPRAY | Freq: Two times a day (BID) | RESPIRATORY_TRACT | Status: DC

## 2015-06-13 NOTE — Telephone Encounter (Signed)
Called spoke with pt. He needed refill on QVAR. I have sent this in. Nothing further needed

## 2015-06-20 ENCOUNTER — Ambulatory Visit (INDEPENDENT_AMBULATORY_CARE_PROVIDER_SITE_OTHER): Payer: Medicare Other

## 2015-06-20 DIAGNOSIS — M1611 Unilateral primary osteoarthritis, right hip: Secondary | ICD-10-CM | POA: Diagnosis not present

## 2015-06-20 DIAGNOSIS — J309 Allergic rhinitis, unspecified: Secondary | ICD-10-CM

## 2015-06-22 ENCOUNTER — Telehealth: Payer: Self-pay | Admitting: Internal Medicine

## 2015-06-22 DIAGNOSIS — J069 Acute upper respiratory infection, unspecified: Secondary | ICD-10-CM | POA: Diagnosis not present

## 2015-06-22 MED ORDER — AMOXICILLIN-POT CLAVULANATE 875-125 MG PO TABS: 1.0000 | ORAL_TABLET | Freq: Two times a day (BID) | ORAL | Status: DC

## 2015-06-22 NOTE — Telephone Encounter (Signed)
Patient has been having difficulty breathing for the past few days.  Patient has had pneumonia in the past and is afraid he may have pneumonia again.  Patient has some chest tightness.  Has some cough, but not a lot.   Sinus congestion, throat congestion. Not able to cough out any congestion.  Not sure if he has fever, has not had chills.  [facility]: [facility]  Allergies  Allergen Reactions  . Morphine And Related Other (See Comments)    Light headed and sweaty; "my heart races" (04/28/2012)  . Levaquin [Levofloxacin] Itching   Current Outpatient Prescriptions on File Prior to Visit  Medication Sig Dispense Refill  . allopurinol (ZYLOPRIM) 100 MG tablet Take 100 mg by mouth daily.      [name] aspirin EC 81 MG tablet Take 81 mg by mouth daily.    [name] atorvastatin (LIPITOR) 10 MG tablet TAKE 1 TABLET (10 MG TOTAL) BY MOUTH DAILY. 30 tablet 3  . beclomethasone (QVAR) 80 MCG/ACT inhaler Inhale 2 puffs into the lungs 2 (two) times daily. 1 Inhaler 3  . candesartan (ATACAND) 4 MG tablet TAKE 0.5 TABLETS (2 MG TOTAL) BY MOUTH DAILY. 15 tablet 6  . diltiazem (CARDIZEM CD) 180 MG 24 hr capsule TAKE 1 CAPSULE (180 MG TOTAL) BY MOUTH DAILY. 30 capsule 5  . diltiazem (CARDIZEM) 30 MG tablet Take 1 tablet every 4 hours AS NEEDED for afib HR >100 and BP >100 30 tablet 0  . isosorbide mononitrate (IMDUR) 30 MG 24 hr tablet TAKE 1 TABLET (30 MG TOTAL) BY MOUTH DAILY. 30 tablet 5  . NONFORMULARY OR COMPOUNDED ITEM Allergy Vaccine 1:10 Given at [facility]    . Olodaterol HCl (STRIVERDI RESPIMAT) 2.5 MCG/ACT AERS Inhale 2 puffs into the lungs daily. 1 Inhaler 5  . pantoprazole (PROTONIX) 40 MG tablet TAKE 1 TABLET BY MOUTH TWICE A DAY 60 tablet 11  . predniSONE (DELTASONE) 10 MG tablet Take 1 tablet (10 mg total) by mouth daily. continuous 30 tablet 11  . PROVENTIL HFA 108 (90 BASE) MCG/ACT inhaler INHALE 2 PUFFS INTO THE LUNGS EVERY 4 (FOUR) HOURS AS NEEDED FOR WHEEZING. 6.7 each 3   No current  facility-administered medications on file prior to visit.

## 2015-06-22 NOTE — Telephone Encounter (Signed)
Offer augmentin 875, # 14, 1 twice daily, hydrate well

## 2015-06-22 NOTE — Telephone Encounter (Signed)
Rx was sent to [facility] with Bryson Ha and notified that this was done  Nothing further needed

## 2015-06-26 DIAGNOSIS — M1611 Unilateral primary osteoarthritis, right hip: Secondary | ICD-10-CM | POA: Diagnosis not present

## 2015-06-27 ENCOUNTER — Ambulatory Visit (INDEPENDENT_AMBULATORY_CARE_PROVIDER_SITE_OTHER): Payer: Medicare Other

## 2015-06-27 DIAGNOSIS — J309 Allergic rhinitis, unspecified: Secondary | ICD-10-CM

## 2015-06-29 DIAGNOSIS — M1611 Unilateral primary osteoarthritis, right hip: Secondary | ICD-10-CM | POA: Diagnosis not present

## 2015-07-06 ENCOUNTER — Ambulatory Visit (INDEPENDENT_AMBULATORY_CARE_PROVIDER_SITE_OTHER): Payer: Medicare Other

## 2015-07-06 ENCOUNTER — Other Ambulatory Visit: Payer: Self-pay | Admitting: Cardiology

## 2015-07-06 DIAGNOSIS — M1611 Unilateral primary osteoarthritis, right hip: Secondary | ICD-10-CM | POA: Diagnosis not present

## 2015-07-06 DIAGNOSIS — J309 Allergic rhinitis, unspecified: Secondary | ICD-10-CM

## 2015-07-07 ENCOUNTER — Other Ambulatory Visit: Payer: Self-pay | Admitting: Cardiology

## 2015-07-11 ENCOUNTER — Ambulatory Visit (INDEPENDENT_AMBULATORY_CARE_PROVIDER_SITE_OTHER): Payer: Medicare Other

## 2015-07-11 ENCOUNTER — Other Ambulatory Visit: Payer: Self-pay | Admitting: Orthopedic Surgery

## 2015-07-11 DIAGNOSIS — J309 Allergic rhinitis, unspecified: Secondary | ICD-10-CM

## 2015-07-14 NOTE — Assessment & Plan Note (Signed)
Pulse feels regular on exam at this visit

## 2015-07-14 NOTE — Assessment & Plan Note (Signed)
He associates allergy shots with significant stabilization of his rhinitis and asthma symptoms. We can continue for another year.

## 2015-07-14 NOTE — Assessment & Plan Note (Addendum)
PFTs have demonstrated obstructive airways disease in the past but normal airflow on most recent PFT in August, 2016. He is describing a little wheezing now and we will try substituting Anoro for [facility] after discussion. He is basically stable allowing for the time of year. Has had flu vaccine.

## 2015-07-18 ENCOUNTER — Ambulatory Visit (INDEPENDENT_AMBULATORY_CARE_PROVIDER_SITE_OTHER): Payer: Medicare Other

## 2015-07-18 DIAGNOSIS — J309 Allergic rhinitis, unspecified: Secondary | ICD-10-CM

## 2015-07-18 DIAGNOSIS — M545 Low back pain: Secondary | ICD-10-CM | POA: Diagnosis not present

## 2015-07-24 NOTE — Pre-Procedure Instructions (Signed)
[name]  07/24/2015      [id number] [place] [place] - [address] [place] [address] Phone: [phone] Fax: [phone]    Your procedure is scheduled on Monday, July 31, 2015   Report to [facility] Admitting at 10:30 A.M.  Call this number if you have problems the morning of surgery:  [phone]   Remember:  Do not eat food or drink liquids after midnight Sunday, July 30, 2015   Take these medicines the morning of surgery with A SIP OF WATER :allopurinol (ZYLOPRIM), diltiazem (CARDIZEM CD), isosorbide mononitrate (IMDUR), pantoprazole (PROTONIX), RAPAFLO, Olodaterol HCl (STRIVERDI RESPIMAT), beclomethasone (QVAR)  Inhaler, if needed:PROVENTIL HFA  Inhaler for wheezing ( Bring inhaler in with you on day of surgery)  Stop taking Aspirin, vitamins, fish oil and herbal medications. Do not take any NSAIDs ie: Ibuprofen, Advil, Naproxen, BC and Goody Powder or any medication containing Aspirin; stop now.   Do not wear jewelry.  Do not wear lotions, powders, or colognes.  You may not wear deodorant.  Men may shave face and neck.  Do not bring valuables to the hospital.  [facility] is not responsible for any belongings or valuables.  Contacts, dentures or bridgework may not be worn into surgery.  Leave your suitcase in the car.  After surgery it may be brought to your room.  For patients admitted to the hospital, discharge time will be determined by your treatment team.  Patients discharged the day of surgery will not be allowed to drive home.     Special instructions: Special Instructions:Special Instructions:  - Preparing for Surgery  Before surgery, you can play an important role.  Because skin is not sterile, your skin needs to be as free of germs as possible.  You can reduce the number of germs on you skin by washing with CHG (chlorahexidine gluconate) soap before surgery.  CHG is an  antiseptic cleaner which kills germs and bonds with the skin to continue killing germs even after washing.  Please DO NOT use if you have an allergy to CHG or antibacterial soaps.  If your skin becomes reddened/irritated stop using the CHG and inform your nurse when you arrive at Short Stay.  Do not shave (including legs and underarms) for at least 48 hours prior to the first CHG shower.  You may shave your face.  Please follow these instructions carefully:   1.  Shower with CHG Soap the night before surgery and the morning of Surgery.  2.  If you choose to wash your hair, wash your hair first as usual with your normal shampoo.  3.  After you shampoo, rinse your hair and body thoroughly to remove the Shampoo.  4.  Use CHG as you would any other liquid soap.  You can apply chg directly  to the skin and wash gently with scrungie or a clean washcloth.  5.  Apply the CHG Soap to your body ONLY FROM THE NECK DOWN.  Do not use on open wounds or open sores.  Avoid contact with your eyes, ears, mouth and genitals (private parts).  Wash genitals (private parts) with your normal soap.  6.  Wash thoroughly, paying special attention to the area where your surgery will be performed.  7.  Thoroughly rinse your body with warm water from the neck down.  8.  DO NOT shower/wash with your normal soap after using and  rinsing off the CHG Soap.  9.  Pat yourself dry with a clean towel.            10.  Wear clean pajamas.            11.  Place clean sheets on your bed the night of your first shower and do not sleep with pets.  Day of Surgery  Do not apply any lotions/deodorants the morning of surgery.  Please wear clean clothes to the hospital/surgery center.  Please read over the following fact sheets that you were given. Pain Booklet, Coughing and Deep Breathing, Blood Transfusion Information, Total Joint Packet, MRSA Information and Surgical Site Infection Prevention

## 2015-07-25 ENCOUNTER — Encounter (HOSPITAL_COMMUNITY)
Admission: RE | Admit: 2015-07-25 | Discharge: 2015-07-25 | Disposition: A | Discharge status: Home / Self Care | Payer: Medicare Other | Source: Ambulatory Visit | Attending: Orthopedic Surgery | Admitting: Orthopedic Surgery

## 2015-07-25 ENCOUNTER — Encounter (HOSPITAL_COMMUNITY): Payer: Self-pay

## 2015-07-25 DIAGNOSIS — I509 Heart failure, unspecified: Secondary | ICD-10-CM | POA: Diagnosis not present

## 2015-07-25 DIAGNOSIS — J45909 Unspecified asthma, uncomplicated: Secondary | ICD-10-CM | POA: Diagnosis not present

## 2015-07-25 DIAGNOSIS — J449 Chronic obstructive pulmonary disease, unspecified: Secondary | ICD-10-CM | POA: Insufficient documentation

## 2015-07-25 DIAGNOSIS — K219 Gastro-esophageal reflux disease without esophagitis: Secondary | ICD-10-CM | POA: Diagnosis not present

## 2015-07-25 DIAGNOSIS — Z96651 Presence of right artificial knee joint: Secondary | ICD-10-CM | POA: Insufficient documentation

## 2015-07-25 DIAGNOSIS — M1611 Unilateral primary osteoarthritis, right hip: Secondary | ICD-10-CM | POA: Diagnosis not present

## 2015-07-25 DIAGNOSIS — Z981 Arthrodesis status: Secondary | ICD-10-CM | POA: Insufficient documentation

## 2015-07-25 DIAGNOSIS — Z96619 Presence of unspecified artificial shoulder joint: Secondary | ICD-10-CM | POA: Insufficient documentation

## 2015-07-25 DIAGNOSIS — I480 Paroxysmal atrial fibrillation: Secondary | ICD-10-CM | POA: Insufficient documentation

## 2015-07-25 DIAGNOSIS — Z7982 Long term (current) use of aspirin: Secondary | ICD-10-CM | POA: Diagnosis not present

## 2015-07-25 DIAGNOSIS — Z79899 Other long term (current) drug therapy: Secondary | ICD-10-CM | POA: Insufficient documentation

## 2015-07-25 DIAGNOSIS — E785 Hyperlipidemia, unspecified: Secondary | ICD-10-CM | POA: Diagnosis not present

## 2015-07-25 DIAGNOSIS — Z0183 Encounter for blood typing: Secondary | ICD-10-CM | POA: Diagnosis not present

## 2015-07-25 DIAGNOSIS — Z01812 Encounter for preprocedural laboratory examination: Secondary | ICD-10-CM | POA: Insufficient documentation

## 2015-07-25 DIAGNOSIS — Z01818 Encounter for other preprocedural examination: Secondary | ICD-10-CM | POA: Diagnosis not present

## 2015-07-25 HISTORY — DX: Reserved for inherently not codable concepts without codable children: IMO0001

## 2015-07-25 LAB — CBC WITH DIFFERENTIAL/PLATELET
Basophils Absolute: 0 10*3/uL (ref 0.0–0.1)
Basophils Relative: 0 %
Eosinophils Absolute: 0.1 10*3/uL (ref 0.0–0.7)
Eosinophils Relative: 1 %
HCT: 38.6 % — ABNORMAL LOW (ref 39.0–52.0)
Hemoglobin: 12.9 g/dL — ABNORMAL LOW (ref 13.0–17.0)
Lymphocytes Relative: 9 %
Lymphs Abs: 0.8 10*3/uL (ref 0.7–4.0)
MCH: 29.3 pg (ref 26.0–34.0)
MCHC: 33.4 g/dL (ref 30.0–36.0)
MCV: 87.7 fL (ref 78.0–100.0)
Monocytes Absolute: 0.6 10*3/uL (ref 0.1–1.0)
Monocytes Relative: 7 %
Neutro Abs: 7.5 10*3/uL (ref 1.7–7.7)
Neutrophils Relative %: 83 %
Platelets: 233 10*3/uL (ref 150–400)
RBC: 4.4 MIL/uL (ref 4.22–5.81)
RDW: 16.4 % — ABNORMAL HIGH (ref 11.5–15.5)
WBC: 9.1 10*3/uL (ref 4.0–10.5)

## 2015-07-25 LAB — TYPE AND SCREEN
ABO/RH(D): O POS
Antibody Screen: NEGATIVE

## 2015-07-25 LAB — COMPREHENSIVE METABOLIC PANEL
ALT: 20 U/L (ref 17–63)
AST: 27 U/L (ref 15–41)
Albumin: 4.1 g/dL (ref 3.5–5.0)
Alkaline Phosphatase: 61 U/L (ref 38–126)
Anion gap: 9 (ref 5–15)
BUN: 23 mg/dL — ABNORMAL HIGH (ref 6–20)
CO2: 27 mmol/L (ref 22–32)
Calcium: 9.6 mg/dL (ref 8.9–10.3)
Chloride: 107 mmol/L (ref 101–111)
Creatinine, Ser: 0.86 mg/dL (ref 0.61–1.24)
GFR calc Af Amer: >60 mL/min (ref >60)
GFR calc non Af Amer: >60 mL/min (ref >60)
Glucose, Bld: 113 mg/dL — ABNORMAL HIGH (ref 65–99)
Potassium: 4.2 mmol/L (ref 3.5–5.1)
Sodium: 143 mmol/L (ref 135–145)
Total Bilirubin: 0.7 mg/dL (ref 0.3–1.2)
Total Protein: 6.5 g/dL (ref 6.5–8.1)

## 2015-07-25 LAB — URINALYSIS, ROUTINE W REFLEX MICROSCOPIC
Glucose, UA: NEGATIVE mg/dL
Hgb urine dipstick: NEGATIVE
Ketones, ur: NEGATIVE mg/dL
Leukocytes, UA: NEGATIVE
Nitrite: NEGATIVE
Protein, ur: NEGATIVE mg/dL
Specific Gravity, Urine: 1.036 — ABNORMAL HIGH (ref 1.005–1.030)
pH: 5.5 (ref 5.0–8.0)

## 2015-07-25 LAB — PROTIME-INR
INR: 0.98 (ref 0.00–1.49)
Prothrombin Time: 13.2 seconds (ref 11.6–15.2)

## 2015-07-25 LAB — SURGICAL PCR SCREEN
MRSA, PCR: NEGATIVE
Staphylococcus aureus: POSITIVE — AB

## 2015-07-25 LAB — APTT: aPTT: 25 seconds (ref 24–37)

## 2015-07-25 NOTE — Progress Notes (Signed)
Patient made aware of positive PCR.  [facility].  Patient verbalized understanding.

## 2015-07-25 NOTE — Progress Notes (Signed)
PCP - Dr. Orpah Melter Cardiologist - [name] Pulmonologist - Dr. Annamaria Boots  EKG - 12/14/14 CXR - 07/25/15  Echo - 04/2012 Cath - 11/2008  Patient denies chest pain and acute shortness of breath at PAT appointment.

## 2015-07-26 NOTE — Progress Notes (Signed)
Anesthesia Chart Review:  Pt is a 70 year old male scheduled for R total hip arthroplasty anterior approach on 07/31/2015 with Dr. Berenice Primas.   Pulmonologist is Dr. Baird Lyons, last office visit 04/27/15. Cardiologist is Dr. Candee Furbish, last office visit 04/24/15. PCP is Dr. Orpah Melter.   PMH includes:  PAF, COPD, asthma, hyperlipidemia, GERD. Never smoker. BMI 20. S/p R TKA 06/17/14. S/p PLIF 06/15/13. S/p ACDF 07/21/12. S/p shoulder arthroplasty 04/28/12. S/p TKA 01/10/12.   Medications include: ASA, lipitor, qvar, candesartan, diltiazem, imdur, protonix, prednisone, albuterol  Preoperative labs reviewed.    Chest x-ray 07/25/15 reviewed. COPD. There is no evidence of pneumonia, CHF, nor other acute cardiopulmonary disease.  EKG 12/14/14: NSR. Right atrial enlargement  PFT- 01/04/2015-minimal restriction, minimal diffusion defect, insignificant response to bronchodilator. FVC 4.55/86%, FEV1 3.28/83%, FEV1/FVC 0.72, TLC 75%, DLCO 77%.  Echo 05/10/12:  - Left ventricle: The cavity size was normal. Systolic function was mildly to moderately reduced. The estimated ejection fraction was in the range of 40% to 50%. Wallmotion was normal; there were no regional wall motionabnormalities. - Mitral valve: Mild regurgitation. - Pulmonary arteries: Systolic pressure was mildlyincreased. PA peak pressure: 21mm Hg (S). - Impressions: When compared to 2010 study, EF is slightly reduced (previously in the 50% range), however NUC stress 2010 demonstrated EF of 43%. Prior cath with no flow limiting CAD.  Cardiac cath 11/25/08:  1. Calcified ostial left anterior descending lesion of up to 20% stenosis, which is not flow limiting. Circumflex artery - In the second obtuse marginal branch, which is a vessel less than 1 mm, there is mid stenosis of 75%. This vessel was too small for any intervention. Otherwise no angiographically significant coronary artery disease.  2. Left ventricular ejection fraction of  approximately 50% with mildly dilated left ventricle and apical hypokinesis noted.  3. No abdominal aortic aneurysm. No renal artery stenosis.  Pt has pulmonary clearance from Dr. Annamaria Boots.   If no changes, I anticipate pt can proceed with surgery as scheduled.   Willeen Cass, FNP-BC [facility]/Anesthesiology Phone: [phone] 07/26/2015 9:51 AM

## 2015-07-28 ENCOUNTER — Ambulatory Visit (INDEPENDENT_AMBULATORY_CARE_PROVIDER_SITE_OTHER): Payer: Medicare Other | Admitting: *Deleted

## 2015-07-28 DIAGNOSIS — J309 Allergic rhinitis, unspecified: Secondary | ICD-10-CM

## 2015-07-28 MED ORDER — CEFAZOLIN SODIUM-DEXTROSE 2-3 GM-% IV SOLR
2.0000 g | INTRAVENOUS | Status: AC
  Administered 2015-07-31: 2 g via INTRAVENOUS
  Filled 2015-07-28: qty 50

## 2015-07-28 MED ORDER — CHLORHEXIDINE GLUCONATE 4 % EX LIQD: 60.0000 mL | Freq: Once | CUTANEOUS | Status: DC

## 2015-07-30 NOTE — H&P (Signed)
TOTAL HIP ADMISSION H&P  Patient is admitted for right total hip arthroplasty.  Subjective:  Chief Complaint: right hip pain  HPI: Levi [name], 70 y.o. male, has a history of pain and functional disability in the right hip(s) due to arthritis and patient has failed non-surgical conservative treatments for greater than 12 weeks to include NSAID's and/or analgesics, corticosteriod injections and activity modification.  Onset of symptoms was gradual starting 6 years ago with gradually worsening course since that time.The patient noted no past surgery on the right hip(s).  Patient currently rates pain in the right hip at 8 out of 10 with activity. Patient has night pain, worsening of pain with activity and weight bearing, trendelenberg gait, pain that interfers with activities of daily living, pain with passive range of motion, crepitus and joint swelling. Patient has evidence of subchondral cysts, subchondral sclerosis, periarticular osteophytes and joint space narrowing by imaging studies. This condition presents safety issues increasing the risk of falls. This patient has had failure of all reasonable conservative care.  There is no current active infection.  Patient Active Problem List   Diagnosis Date Noted  . Primary osteoarthritis of right knee 06/17/2014  . Thrush of mouth and esophagus ([place]) 04/03/2014  . Palpitation 03/28/2014  . Pseudoarthrosis of lumbar spine 06/15/2013  . Hyperlipidemia   . History of colonic polyps 02/09/2013  . Atrial fibrillation ([place]) 05/11/2012  . Osteoarthritis of left knee 01/10/2012  . Allergic rhinitis due to pollen 07/23/2010  . Chronic obstructive airway disease with asthma ([place]) 03/06/2009   Past Medical History  Diagnosis Date  . Asthma   . ALLERGIC RHINITIS   . Hyperlipidemia     takes Lipitor daily; pt. states he takes a preventive  . Agent orange exposure 1970's    takes Imdur,Diltiazem, and Atacand daily  . Joint pain   . Joint swelling    . Elevated uric acid in blood     takes Allopurinol daily  . Bruises easily     d/t prednisone daily  . GERD (gastroesophageal reflux disease)     takes Protonix daily  . Diverticulitis   . Enlarged prostate     but not on any meds  . History of pneumonia     a. 2010  . Chronic bronchitis ([place])     "used to get it q yr; last time ?2008" (04/28/2012)  . H/O hiatal hernia   . Degenerative disk disease     "qwhere" (04/28/2012)  . Personal history of colonic adenomas 02/09/2013  . PAF (paroxysmal atrial fibrillation) ([place])     a. Dx 04/2012, CHA2DS2VASc = 1 (age);  b. 04/2012 Echo: EF 40-50%, mild MR.  [name] Dysrhythmia     afib  . COPD (chronic obstructive pulmonary disease) ([facility])     agent orange exposure  . Shortness of breath dyspnea     Past Surgical History  Procedure Laterality Date  . Cholecystectomy    . Total hip arthroplasty Left 2011    "left" (04/28/2012)  . Lateral / posterior combined fusion lumbar spine  2012  . Total knee arthroplasty  01/10/2012    Procedure: TOTAL KNEE ARTHROPLASTY;  Surgeon: [name], MD;  Location: [place];  Service: Orthopedics;;  left total knee arthroplasty  . Cardiac catheterization  11/2008    [name] - 20% calcified non flow limiting left main, 50% EF apical hypokinesis  . Knee arthroscopy Bilateral   . Colonoscopy    . Esophagogastroduodenoscopy    . Tonsillectomy  and adenoidectomy  1954  . Reverse shoulder arthroplasty  04/28/2012    Procedure: REVERSE SHOULDER ARTHROPLASTY;  Surgeon: [name], MD;  Location: [place];  Service: Orthopedics;  Laterality: Left;  Left shouder reverse total shoulder arthroplasty  . Shoulder surgery    . Anterior cervical decomp/discectomy fusion N/A 07/21/2012    Procedure: ANTERIOR CERVICAL DECOMPRESSION/DISCECTOMY FUSION 2 LEVELS;  Surgeon: [name], MD;  Location: [place] [facility];  Service: Neurosurgery;  Laterality: N/A;  C4-5 C5-6 Anterior cervical decompression/diskectomy/fusion  . Eye  surgery Bilateral 2011    steroidal encapsulation" (04/28/2012)  . Total knee arthroplasty Right 06/17/2014    Procedure: TOTAL KNEE ARTHROPLASTY;  Surgeon: [name], MD;  Location: [place];  Service: Orthopedics;  Laterality: Right;  . Back surgery      x 3  . Foot surgery Right     x 2    No prescriptions prior to admission   Allergies  Allergen Reactions  . Morphine And Related Palpitations    Light headed and sweaty; "my heart races" (04/28/2012), pt states too much morphine gave him this reaction  . Levaquin [Levofloxacin] Itching    Social History  Substance Use Topics  . Smoking status: Never Smoker   . Smokeless tobacco: Never Used  . Alcohol Use: No    Family History  Problem Relation Age of Onset  . Heart disease Father     Died 74, MI  . Prostate cancer Brother   . Colon cancer Neg Hx   . Esophageal cancer Neg Hx   . Rectal cancer Neg Hx   . Stomach cancer Neg Hx   . Pancreatic cancer Neg Hx   . Kidney disease Neg Hx   . Liver disease Neg Hx   . Diabetes Mother      ROS ROS: I have reviewed the patient's review of systems thoroughly and there are no positive responses as relates to the HPI. Objective:  Physical Exam  Vital signs in last 24 hours:   Well-developed well-nourished patient in no acute distress. Alert and oriented x3 HEENT:within normal limits Cardiac: Regular rate and rhythm Pulmonary: Lungs clear to auscultation Abdomen: Soft and nontender.  Normal active bowel sounds  Musculoskeletal: right hip: Painful range of motion.  Limited range of motion.  0 internal rotation.  Neurovascularly intact distally. Labs: Recent Results (from the past 2160 hour(s))  Surgical pcr screen     Status: Abnormal   Collection Time: 07/25/15  8:39 AM  Result Value Ref Range   MRSA, PCR NEGATIVE NEGATIVE   Staphylococcus aureus POSITIVE (A) NEGATIVE    Comment:        The Xpert SA Assay (FDA approved for NASAL specimens in patients over 21 years of  age), is one component of a comprehensive surveillance program.  Test performance has been validated by [facility] for patients greater than or equal to 42 year old. It is not intended to diagnose infection nor to guide or monitor treatment.   APTT     Status: None   Collection Time: 07/25/15  8:40 AM  Result Value Ref Range   aPTT 25 24 - 37 seconds  CBC WITH DIFFERENTIAL     Status: Abnormal   Collection Time: 07/25/15  8:40 AM  Result Value Ref Range   WBC 9.1 4.0 - 10.5 K/uL   RBC 4.40 4.22 - 5.81 MIL/uL   Hemoglobin 12.9 (L) 13.0 - 17.0 g/dL   HCT 38.6 (L) 39.0 - 52.0 %  MCV 87.7 78.0 - 100.0 fL   MCH 29.3 26.0 - 34.0 pg   MCHC 33.4 30.0 - 36.0 g/dL   RDW 16.4 (H) 11.5 - 15.5 %   Platelets 233 150 - 400 K/uL   Neutrophils Relative % 83 %   Neutro Abs 7.5 1.7 - 7.7 K/uL   Lymphocytes Relative 9 %   Lymphs Abs 0.8 0.7 - 4.0 K/uL   Monocytes Relative 7 %   Monocytes Absolute 0.6 0.1 - 1.0 K/uL   Eosinophils Relative 1 %   Eosinophils Absolute 0.1 0.0 - 0.7 K/uL   Basophils Relative 0 %   Basophils Absolute 0.0 0.0 - 0.1 K/uL  Comprehensive metabolic panel     Status: Abnormal   Collection Time: 07/25/15  8:40 AM  Result Value Ref Range   Sodium 143 135 - 145 mmol/L   Potassium 4.2 3.5 - 5.1 mmol/L   Chloride 107 101 - 111 mmol/L   CO2 27 22 - 32 mmol/L   Glucose, Bld 113 (H) 65 - 99 mg/dL   BUN 23 (H) 6 - 20 mg/dL   Creatinine, Ser 0.86 0.61 - 1.24 mg/dL   Calcium 9.6 8.9 - 10.3 mg/dL   Total Protein 6.5 6.5 - 8.1 g/dL   Albumin 4.1 3.5 - 5.0 g/dL   AST 27 15 - 41 U/L   ALT 20 17 - 63 U/L   Alkaline Phosphatase 61 38 - 126 U/L   Total Bilirubin 0.7 0.3 - 1.2 mg/dL   GFR calc non Af Amer >60 >60 mL/min   GFR calc Af Amer >60 >60 mL/min    Comment: (NOTE) The eGFR has been calculated using the CKD EPI equation. This calculation has not been validated in all clinical situations. eGFR's persistently <60 mL/min signify possible Chronic Kidney Disease.     Anion gap 9 5 - 15  Protime-INR     Status: None   Collection Time: 07/25/15  8:40 AM  Result Value Ref Range   Prothrombin Time 13.2 11.6 - 15.2 seconds   INR 0.98 0.00 - 1.49  Type and screen Order type and screen if day of surgery is less than 15 days from draw of preadmission visit or order morning of surgery if day of surgery is greater than 6 days from preadmission visit.     Status: None   Collection Time: 07/25/15  8:40 AM  Result Value Ref Range   ABO/RH(D) O POS    Antibody Screen NEG    Sample Expiration 08/08/2015    Extend sample reason NO TRANSFUSIONS OR PREGNANCY IN THE PAST 3 MONTHS   Urinalysis, Routine w reflex microscopic (not at [facility])     Status: Abnormal   Collection Time: 07/25/15  8:40 AM  Result Value Ref Range   Color, Urine AMBER (A) YELLOW    Comment: BIOCHEMICALS MAY BE AFFECTED BY COLOR   APPearance CLEAR CLEAR   Specific Gravity, Urine 1.036 (H) 1.005 - 1.030   pH 5.5 5.0 - 8.0   Glucose, UA NEGATIVE NEGATIVE mg/dL   Hgb urine dipstick NEGATIVE NEGATIVE   Bilirubin Urine SMALL (A) NEGATIVE   Ketones, ur NEGATIVE NEGATIVE mg/dL   Protein, ur NEGATIVE NEGATIVE mg/dL   Nitrite NEGATIVE NEGATIVE   Leukocytes, UA NEGATIVE NEGATIVE    Comment: MICROSCOPIC NOT DONE ON URINES WITH NEGATIVE PROTEIN, BLOOD, LEUKOCYTES, NITRITE, OR GLUCOSE <1000 mg/dL.    Estimated body mass index is 19.84 kg/(m^2) as calculated from the following:   Height as of  04/27/15: 6' 2"  (1.88 m).   Weight as of 04/27/15: 70.126 kg (154 lb 9.6 oz).   Imaging Review Plain radiographs demonstrate moderate degenerative joint disease of the right hip(s). The bone quality appears to be fair for age and reported activity level.  Assessment/Plan:  End stage arthritis, right hip(s)  The patient history, physical examination, clinical judgement of the provider and imaging studies are consistent with end stage degenerative joint disease of the right hip(s) and total hip arthroplasty is deemed  medically necessary. The treatment options including medical management, injection therapy, arthroscopy and arthroplasty were discussed at length. The risks and benefits of total hip arthroplasty were presented and reviewed. The risks due to aseptic loosening, infection, stiffness, dislocation/subluxation,  thromboembolic complications and other imponderables were discussed.  The patient acknowledged the explanation, agreed to proceed with the plan and consent was signed. Patient is being admitted for inpatient treatment for surgery, pain control, PT, OT, prophylactic antibiotics, VTE prophylaxis, progressive ambulation and ADL's and discharge planning.The patient is planning to be discharged home with home health services

## 2015-07-31 ENCOUNTER — Inpatient Hospital Stay (HOSPITAL_COMMUNITY): Payer: Medicare Other | Admitting: Certified Registered Nurse Anesthetist

## 2015-07-31 ENCOUNTER — Encounter (HOSPITAL_COMMUNITY): Payer: Self-pay | Admitting: Certified Registered Nurse Anesthetist

## 2015-07-31 ENCOUNTER — Inpatient Hospital Stay (HOSPITAL_COMMUNITY): Payer: Medicare Other

## 2015-07-31 ENCOUNTER — Inpatient Hospital Stay (HOSPITAL_COMMUNITY): Payer: Medicare Other | Admitting: Emergency Medicine

## 2015-07-31 ENCOUNTER — Inpatient Hospital Stay (HOSPITAL_COMMUNITY)
Admission: RE | Admit: 2015-07-31 | Discharge: 2015-08-01 | DRG: 470 | Disposition: A | Discharge status: Home / Self Care | Payer: Medicare Other | Source: Ambulatory Visit | Attending: Orthopedic Surgery | Admitting: Orthopedic Surgery

## 2015-07-31 ENCOUNTER — Encounter (HOSPITAL_COMMUNITY)
Admission: RE | Disposition: A | Discharge status: Home / Self Care | Payer: Self-pay | Source: Ambulatory Visit | Attending: Orthopedic Surgery

## 2015-07-31 DIAGNOSIS — Z981 Arthrodesis status: Secondary | ICD-10-CM

## 2015-07-31 DIAGNOSIS — Z96612 Presence of left artificial shoulder joint: Secondary | ICD-10-CM | POA: Diagnosis present

## 2015-07-31 DIAGNOSIS — M1611 Unilateral primary osteoarthritis, right hip: Secondary | ICD-10-CM | POA: Diagnosis not present

## 2015-07-31 DIAGNOSIS — Z885 Allergy status to narcotic agent status: Secondary | ICD-10-CM

## 2015-07-31 DIAGNOSIS — N400 Enlarged prostate without lower urinary tract symptoms: Secondary | ICD-10-CM | POA: Diagnosis present

## 2015-07-31 DIAGNOSIS — J449 Chronic obstructive pulmonary disease, unspecified: Secondary | ICD-10-CM | POA: Diagnosis not present

## 2015-07-31 DIAGNOSIS — Z96653 Presence of artificial knee joint, bilateral: Secondary | ICD-10-CM | POA: Diagnosis present

## 2015-07-31 DIAGNOSIS — Z791 Long term (current) use of non-steroidal anti-inflammatories (NSAID): Secondary | ICD-10-CM | POA: Diagnosis not present

## 2015-07-31 DIAGNOSIS — Z77098 Contact with and (suspected) exposure to other hazardous, chiefly nonmedicinal, chemicals: Secondary | ICD-10-CM | POA: Diagnosis present

## 2015-07-31 DIAGNOSIS — M169 Osteoarthritis of hip, unspecified: Secondary | ICD-10-CM | POA: Diagnosis not present

## 2015-07-31 DIAGNOSIS — Z96641 Presence of right artificial hip joint: Secondary | ICD-10-CM

## 2015-07-31 DIAGNOSIS — Z881 Allergy status to other antibiotic agents status: Secondary | ICD-10-CM

## 2015-07-31 DIAGNOSIS — M25551 Pain in right hip: Secondary | ICD-10-CM | POA: Diagnosis not present

## 2015-07-31 DIAGNOSIS — I480 Paroxysmal atrial fibrillation: Secondary | ICD-10-CM | POA: Diagnosis present

## 2015-07-31 DIAGNOSIS — E785 Hyperlipidemia, unspecified: Secondary | ICD-10-CM | POA: Diagnosis present

## 2015-07-31 DIAGNOSIS — T65891S Toxic effect of other specified substances, accidental (unintentional), sequela: Secondary | ICD-10-CM | POA: Diagnosis not present

## 2015-07-31 DIAGNOSIS — Z419 Encounter for procedure for purposes other than remedying health state, unspecified: Secondary | ICD-10-CM

## 2015-07-31 DIAGNOSIS — J301 Allergic rhinitis due to pollen: Secondary | ICD-10-CM | POA: Diagnosis present

## 2015-07-31 DIAGNOSIS — Z471 Aftercare following joint replacement surgery: Secondary | ICD-10-CM | POA: Diagnosis not present

## 2015-07-31 DIAGNOSIS — Z96642 Presence of left artificial hip joint: Secondary | ICD-10-CM | POA: Diagnosis present

## 2015-07-31 DIAGNOSIS — Z7951 Long term (current) use of inhaled steroids: Secondary | ICD-10-CM | POA: Diagnosis not present

## 2015-07-31 DIAGNOSIS — K219 Gastro-esophageal reflux disease without esophagitis: Secondary | ICD-10-CM | POA: Diagnosis present

## 2015-07-31 DIAGNOSIS — Z7952 Long term (current) use of systemic steroids: Secondary | ICD-10-CM | POA: Diagnosis not present

## 2015-07-31 HISTORY — PX: TOTAL HIP ARTHROPLASTY: SHX124

## 2015-07-31 HISTORY — DX: Presence of right artificial hip joint: Z96.641

## 2015-07-31 SURGERY — ARTHROPLASTY, HIP, TOTAL, ANTERIOR APPROACH
Anesthesia: General | Site: Hip | Laterality: Right

## 2015-07-31 MED ORDER — BUPIVACAINE HCL 0.5 % IJ SOLN
INTRAMUSCULAR | Status: DC | PRN
  Administered 2015-07-31: 20 mL

## 2015-07-31 MED ORDER — GLYCOPYRROLATE 0.2 MG/ML IJ SOLN
INTRAMUSCULAR | Status: AC
  Filled 2015-07-31: qty 2

## 2015-07-31 MED ORDER — ONDANSETRON HCL 4 MG/2ML IJ SOLN: 4.0000 mg | Freq: Four times a day (QID) | INTRAMUSCULAR | Status: DC | PRN

## 2015-07-31 MED ORDER — DOCUSATE SODIUM 100 MG PO CAPS
100.0000 mg | ORAL_CAPSULE | Freq: Two times a day (BID) | ORAL | Status: DC
  Administered 2015-07-31 – 2015-08-01 (×2): 100 mg via ORAL
  Filled 2015-07-31 (×2): qty 1

## 2015-07-31 MED ORDER — METHOCARBAMOL 500 MG PO TABS
500.0000 mg | ORAL_TABLET | Freq: Four times a day (QID) | ORAL | Status: DC | PRN
  Filled 2015-07-31: qty 1

## 2015-07-31 MED ORDER — SODIUM CHLORIDE 0.9 % IV SOLN
INTRAVENOUS | Status: DC
  Administered 2015-07-31: 19:00:00 via INTRAVENOUS

## 2015-07-31 MED ORDER — DEXAMETHASONE SODIUM PHOSPHATE 10 MG/ML IJ SOLN
10.0000 mg | Freq: Four times a day (QID) | INTRAMUSCULAR | Status: DC
  Administered 2015-07-31 – 2015-08-01 (×3): 10 mg via INTRAVENOUS
  Filled 2015-07-31 (×3): qty 1

## 2015-07-31 MED ORDER — ACETAMINOPHEN 10 MG/ML IV SOLN
1000.0000 mg | Freq: Once | INTRAVENOUS | Status: AC
  Administered 2015-07-31: 1000 mg via INTRAVENOUS

## 2015-07-31 MED ORDER — PANTOPRAZOLE SODIUM 40 MG PO TBEC
40.0000 mg | DELAYED_RELEASE_TABLET | Freq: Two times a day (BID) | ORAL | Status: DC
  Administered 2015-07-31 – 2015-08-01 (×2): 40 mg via ORAL
  Filled 2015-07-31 (×2): qty 1

## 2015-07-31 MED ORDER — FENTANYL CITRATE (PF) 250 MCG/5ML IJ SOLN
INTRAMUSCULAR | Status: AC
  Filled 2015-07-31: qty 5

## 2015-07-31 MED ORDER — ONDANSETRON HCL 4 MG/2ML IJ SOLN
INTRAMUSCULAR | Status: DC | PRN
  Administered 2015-07-31: 4 mg via INTRAVENOUS

## 2015-07-31 MED ORDER — TAMSULOSIN HCL 0.4 MG PO CAPS
0.4000 mg | ORAL_CAPSULE | Freq: Every day | ORAL | Status: DC
  Administered 2015-08-01: 0.4 mg via ORAL
  Filled 2015-07-31: qty 1

## 2015-07-31 MED ORDER — HYDROMORPHONE HCL 1 MG/ML IJ SOLN
INTRAMUSCULAR | Status: AC
  Administered 2015-07-31: 0.5 mg via INTRAVENOUS
  Filled 2015-07-31: qty 1

## 2015-07-31 MED ORDER — ALUM & MAG HYDROXIDE-SIMETH 200-200-20 MG/5ML PO SUSP: 30.0000 mL | ORAL | Status: DC | PRN

## 2015-07-31 MED ORDER — GLYCOPYRROLATE 0.2 MG/ML IJ SOLN
INTRAMUSCULAR | Status: DC | PRN
  Administered 2015-07-31: .4 mg via INTRAVENOUS

## 2015-07-31 MED ORDER — NEOSTIGMINE METHYLSULFATE 10 MG/10ML IV SOLN
INTRAVENOUS | Status: DC | PRN
  Administered 2015-07-31: 3 mg via INTRAVENOUS

## 2015-07-31 MED ORDER — BUPIVACAINE HCL (PF) 0.5 % IJ SOLN
INTRAMUSCULAR | Status: AC
  Filled 2015-07-31: qty 30

## 2015-07-31 MED ORDER — ATORVASTATIN CALCIUM 10 MG PO TABS: 10.0000 mg | ORAL_TABLET | Freq: Every day | ORAL | Status: DC

## 2015-07-31 MED ORDER — ALLOPURINOL 100 MG PO TABS
100.0000 mg | ORAL_TABLET | Freq: Every day | ORAL | Status: DC
  Administered 2015-08-01: 100 mg via ORAL
  Filled 2015-07-31: qty 1

## 2015-07-31 MED ORDER — ROCURONIUM BROMIDE 100 MG/10ML IV SOLN
INTRAVENOUS | Status: DC | PRN
  Administered 2015-07-31: 40 mg via INTRAVENOUS

## 2015-07-31 MED ORDER — OXYCODONE HCL 5 MG PO TABS: 5.0000 mg | ORAL_TABLET | ORAL | Status: DC | PRN

## 2015-07-31 MED ORDER — HYDROMORPHONE HCL 1 MG/ML IJ SOLN
0.5000 mg | INTRAMUSCULAR | Status: DC | PRN
  Administered 2015-07-31: 1 mg via INTRAVENOUS
  Filled 2015-07-31: qty 1

## 2015-07-31 MED ORDER — 0.9 % SODIUM CHLORIDE (POUR BTL) OPTIME
TOPICAL | Status: DC | PRN
  Administered 2015-07-31: 1000 mL

## 2015-07-31 MED ORDER — BISACODYL 5 MG PO TBEC: 5.0000 mg | DELAYED_RELEASE_TABLET | Freq: Every day | ORAL | Status: DC | PRN

## 2015-07-31 MED ORDER — CYCLOBENZAPRINE HCL 10 MG PO TABS
10.0000 mg | ORAL_TABLET | Freq: Three times a day (TID) | ORAL | Status: DC | PRN
Start: 2015-07-31 — End: 2015-08-01
  Administered 2015-07-31: 10 mg via ORAL

## 2015-07-31 MED ORDER — ALBUTEROL SULFATE (2.5 MG/3ML) 0.083% IN NEBU
3.0000 mL | INHALATION_SOLUTION | Freq: Three times a day (TID) | RESPIRATORY_TRACT | Status: DC
  Administered 2015-07-31: 3 mL via RESPIRATORY_TRACT

## 2015-07-31 MED ORDER — IRBESARTAN 75 MG PO TABS
37.5000 mg | ORAL_TABLET | Freq: Every day | ORAL | Status: DC
  Administered 2015-08-01: 37.5 mg via ORAL
  Filled 2015-07-31 (×2): qty 0.5

## 2015-07-31 MED ORDER — PREDNISONE 10 MG PO TABS: 10.0000 mg | ORAL_TABLET | Freq: Every day | ORAL | Status: DC

## 2015-07-31 MED ORDER — CEFAZOLIN SODIUM-DEXTROSE 2-3 GM-% IV SOLR
2.0000 g | Freq: Four times a day (QID) | INTRAVENOUS | Status: AC
  Administered 2015-07-31 – 2015-08-01 (×2): 2 g via INTRAVENOUS
  Filled 2015-07-31 (×2): qty 50

## 2015-07-31 MED ORDER — ALBUTEROL SULFATE (2.5 MG/3ML) 0.083% IN NEBU
3.0000 mL | INHALATION_SOLUTION | RESPIRATORY_TRACT | Status: DC
  Filled 2015-07-31: qty 3

## 2015-07-31 MED ORDER — LIDOCAINE HCL (CARDIAC) 20 MG/ML IV SOLN
INTRAVENOUS | Status: DC | PRN
  Administered 2015-07-31: 70 mg via INTRAVENOUS

## 2015-07-31 MED ORDER — HYDROMORPHONE HCL 1 MG/ML IJ SOLN
0.5000 mg | INTRAMUSCULAR | Status: AC | PRN
  Administered 2015-07-31 (×2): 0.5 mg via INTRAVENOUS

## 2015-07-31 MED ORDER — MIDAZOLAM HCL 5 MG/5ML IJ SOLN: INTRAMUSCULAR | Status: DC | PRN

## 2015-07-31 MED ORDER — PROPOFOL 10 MG/ML IV BOLUS
INTRAVENOUS | Status: DC | PRN
  Administered 2015-07-31: 120 mg via INTRAVENOUS

## 2015-07-31 MED ORDER — MEPERIDINE HCL 25 MG/ML IJ SOLN: 6.2500 mg | INTRAMUSCULAR | Status: DC | PRN

## 2015-07-31 MED ORDER — TRANEXAMIC ACID 1000 MG/10ML IV SOLN
1000.0000 mg | INTRAVENOUS | Status: AC
  Administered 2015-07-31: 1000 mg via INTRAVENOUS
  Filled 2015-07-31: qty 10

## 2015-07-31 MED ORDER — KETOROLAC TROMETHAMINE 15 MG/ML IJ SOLN
15.0000 mg | Freq: Three times a day (TID) | INTRAMUSCULAR | Status: DC
  Administered 2015-07-31 – 2015-08-01 (×3): 15 mg via INTRAVENOUS
  Filled 2015-07-31 (×2): qty 1

## 2015-07-31 MED ORDER — ASPIRIN EC 325 MG PO TBEC
325.0000 mg | DELAYED_RELEASE_TABLET | Freq: Two times a day (BID) | ORAL | Status: DC
  Administered 2015-07-31 – 2015-08-01 (×2): 325 mg via ORAL
  Filled 2015-07-31 (×2): qty 1

## 2015-07-31 MED ORDER — MAGNESIUM CITRATE PO SOLN: 1.0000 | Freq: Once | ORAL | Status: DC | PRN

## 2015-07-31 MED ORDER — DILTIAZEM HCL ER COATED BEADS 180 MG PO CP24
180.0000 mg | ORAL_CAPSULE | Freq: Every day | ORAL | Status: DC
  Administered 2015-08-01: 180 mg via ORAL
  Filled 2015-07-31: qty 1

## 2015-07-31 MED ORDER — POLYETHYLENE GLYCOL 3350 17 G PO PACK: 17.0000 g | PACK | Freq: Every day | ORAL | Status: DC | PRN

## 2015-07-31 MED ORDER — ACETAMINOPHEN 325 MG PO TABS: 650.0000 mg | ORAL_TABLET | Freq: Four times a day (QID) | ORAL | Status: DC | PRN

## 2015-07-31 MED ORDER — DILTIAZEM HCL 30 MG PO TABS: 30.0000 mg | ORAL_TABLET | Freq: Four times a day (QID) | ORAL | Status: DC

## 2015-07-31 MED ORDER — BUPIVACAINE LIPOSOME 1.3 % IJ SUSP
INTRAMUSCULAR | Status: DC | PRN
  Administered 2015-07-31: 20 mL

## 2015-07-31 MED ORDER — ACETAMINOPHEN 10 MG/ML IV SOLN: 1000.0000 mg | Freq: Four times a day (QID) | INTRAVENOUS | Status: DC

## 2015-07-31 MED ORDER — ZOLPIDEM TARTRATE 5 MG PO TABS: 5.0000 mg | ORAL_TABLET | Freq: Every evening | ORAL | Status: DC | PRN

## 2015-07-31 MED ORDER — ASPIRIN EC 325 MG PO TBEC: 325.0000 mg | DELAYED_RELEASE_TABLET | Freq: Two times a day (BID) | ORAL | Status: DC

## 2015-07-31 MED ORDER — OXYCODONE-ACETAMINOPHEN 5-325 MG PO TABS: 1.0000 | ORAL_TABLET | ORAL | Status: DC | PRN

## 2015-07-31 MED ORDER — ACETAMINOPHEN 10 MG/ML IV SOLN
INTRAVENOUS | Status: AC
  Administered 2015-07-31: 1000 mg via INTRAVENOUS
  Filled 2015-07-31: qty 100

## 2015-07-31 MED ORDER — TRANEXAMIC ACID 1000 MG/10ML IV SOLN
1000.0000 mg | Freq: Once | INTRAVENOUS | Status: AC
  Administered 2015-07-31: 1000 mg via INTRAVENOUS
  Filled 2015-07-31: qty 10

## 2015-07-31 MED ORDER — ONDANSETRON HCL 4 MG PO TABS: 4.0000 mg | ORAL_TABLET | Freq: Four times a day (QID) | ORAL | Status: DC | PRN

## 2015-07-31 MED ORDER — KETOROLAC TROMETHAMINE 15 MG/ML IJ SOLN
INTRAMUSCULAR | Status: AC
  Administered 2015-07-31: 15 mg via INTRAVENOUS
  Filled 2015-07-31: qty 1

## 2015-07-31 MED ORDER — CYCLOBENZAPRINE HCL 10 MG PO TABS
ORAL_TABLET | ORAL | Status: AC
  Filled 2015-07-31: qty 1

## 2015-07-31 MED ORDER — ACETAMINOPHEN 650 MG RE SUPP: 650.0000 mg | Freq: Four times a day (QID) | RECTAL | Status: DC | PRN

## 2015-07-31 MED ORDER — METHOCARBAMOL 1000 MG/10ML IJ SOLN
500.0000 mg | Freq: Four times a day (QID) | INTRAMUSCULAR | Status: DC | PRN
  Filled 2015-07-31: qty 5

## 2015-07-31 MED ORDER — HYDROMORPHONE HCL 1 MG/ML IJ SOLN
0.2500 mg | INTRAMUSCULAR | Status: DC | PRN
  Administered 2015-07-31 (×4): 0.5 mg via INTRAVENOUS

## 2015-07-31 MED ORDER — DIPHENHYDRAMINE HCL 12.5 MG/5ML PO ELIX: 12.5000 mg | ORAL_SOLUTION | ORAL | Status: DC | PRN

## 2015-07-31 MED ORDER — FENTANYL CITRATE (PF) 100 MCG/2ML IJ SOLN
INTRAMUSCULAR | Status: DC | PRN
  Administered 2015-07-31 (×3): 50 ug via INTRAVENOUS
  Administered 2015-07-31: 150 ug via INTRAVENOUS
  Administered 2015-07-31 (×2): 50 ug via INTRAVENOUS
  Administered 2015-07-31 (×2): 100 ug via INTRAVENOUS
  Administered 2015-07-31: 50 ug via INTRAVENOUS

## 2015-07-31 MED ORDER — BUPIVACAINE LIPOSOME 1.3 % IJ SUSP
20.0000 mL | Freq: Once | INTRAMUSCULAR | Status: DC
  Filled 2015-07-31: qty 20

## 2015-07-31 MED ORDER — ONDANSETRON HCL 4 MG/2ML IJ SOLN: 4.0000 mg | Freq: Once | INTRAMUSCULAR | Status: DC | PRN

## 2015-07-31 MED ORDER — LACTATED RINGERS IV SOLN
INTRAVENOUS | Status: DC
  Administered 2015-07-31 (×2): via INTRAVENOUS

## 2015-07-31 MED ORDER — ARFORMOTEROL TARTRATE 15 MCG/2ML IN NEBU
15.0000 ug | INHALATION_SOLUTION | Freq: Two times a day (BID) | RESPIRATORY_TRACT | Status: DC
  Administered 2015-07-31: 15 ug via RESPIRATORY_TRACT
  Filled 2015-07-31: qty 2

## 2015-07-31 MED ORDER — BUDESONIDE 0.25 MG/2ML IN SUSP
0.2500 mg | Freq: Two times a day (BID) | RESPIRATORY_TRACT | Status: DC
  Administered 2015-07-31: 0.25 mg via RESPIRATORY_TRACT
  Filled 2015-07-31: qty 2

## 2015-07-31 MED ORDER — ISOSORBIDE MONONITRATE ER 30 MG PO TB24
30.0000 mg | ORAL_TABLET | Freq: Every day | ORAL | Status: DC
  Administered 2015-08-01: 30 mg via ORAL
  Filled 2015-07-31: qty 1

## 2015-07-31 SURGICAL SUPPLY — 60 items
APL SKNCLS STERI-STRIP NONHPOA (GAUZE/BANDAGES/DRESSINGS) ×1
BENZOIN TINCTURE PRP APPL 2/3 (GAUZE/BANDAGES/DRESSINGS) ×2 IMPLANT
BLADE SAW SGTL 18X1.27X75 (BLADE) ×2 IMPLANT
BLADE SURG ROTATE 9660 (MISCELLANEOUS) IMPLANT
BNDG COHESIVE 6X5 TAN STRL LF (GAUZE/BANDAGES/DRESSINGS) IMPLANT
BNDG GAUZE ELAST 4 BULKY (GAUZE/BANDAGES/DRESSINGS) IMPLANT
CAPT HIP TOTAL 2 ×1 IMPLANT
CELLS DAT CNTRL 66122 CELL SVR (MISCELLANEOUS) IMPLANT
CLSR STERI-STRIP ANTIMIC 1/2X4 (GAUZE/BANDAGES/DRESSINGS) ×1 IMPLANT
COVER PERINEAL POST (MISCELLANEOUS) ×2 IMPLANT
COVER SURGICAL LIGHT HANDLE (MISCELLANEOUS) ×3 IMPLANT
DRAPE C-ARM 42X72 X-RAY (DRAPES) ×2 IMPLANT
DRAPE PROXIMA HALF (DRAPES) ×1 IMPLANT
DRAPE STERI IOBAN 125X83 (DRAPES) ×2 IMPLANT
DRAPE U-SHAPE 47X51 STRL (DRAPES) ×5 IMPLANT
DRSG AQUACEL AG ADV 3.5X10 (GAUZE/BANDAGES/DRESSINGS) ×2 IMPLANT
DURAPREP 26ML APPLICATOR (WOUND CARE) ×2 IMPLANT
ELECT BLADE 4.0 EZ CLEAN MEGAD (MISCELLANEOUS) ×2
ELECT CAUTERY BLADE 6.4 (BLADE) ×2 IMPLANT
ELECT REM PT RETURN 9FT ADLT (ELECTROSURGICAL) ×2
ELECTRODE BLDE 4.0 EZ CLN MEGD (MISCELLANEOUS) IMPLANT
ELECTRODE REM PT RTRN 9FT ADLT (ELECTROSURGICAL) ×1 IMPLANT
GAUZE XEROFORM 1X8 LF (GAUZE/BANDAGES/DRESSINGS) ×1 IMPLANT
GLOVE BIOGEL PI IND STRL 8 (GLOVE) ×2 IMPLANT
GLOVE BIOGEL PI INDICATOR 8 (GLOVE) ×2
GLOVE ECLIPSE 7.5 STRL STRAW (GLOVE) ×4 IMPLANT
GOWN STRL REUS W/ TWL LRG LVL3 (GOWN DISPOSABLE) ×2 IMPLANT
GOWN STRL REUS W/ TWL XL LVL3 (GOWN DISPOSABLE) ×2 IMPLANT
GOWN STRL REUS W/TWL LRG LVL3 (GOWN DISPOSABLE) ×4
GOWN STRL REUS W/TWL XL LVL3 (GOWN DISPOSABLE) ×4
HOOD PEEL AWAY FACE SHEILD DIS (HOOD) ×4 IMPLANT
KIT BASIN OR (CUSTOM PROCEDURE TRAY) ×2 IMPLANT
KIT ROOM TURNOVER OR (KITS) ×2 IMPLANT
MANIFOLD NEPTUNE II (INSTRUMENTS) ×2 IMPLANT
NDL SPNL 22GX3.5 QUINCKE BK (NEEDLE) ×1 IMPLANT
NEEDLE SPNL 22GX3.5 QUINCKE BK (NEEDLE) ×2 IMPLANT
NS IRRIG 1000ML POUR BTL (IV SOLUTION) ×2 IMPLANT
PACK TOTAL JOINT (CUSTOM PROCEDURE TRAY) ×2 IMPLANT
PACK UNIVERSAL I (CUSTOM PROCEDURE TRAY) ×1 IMPLANT
PAD ARMBOARD 7.5X6 YLW CONV (MISCELLANEOUS) ×4 IMPLANT
RETRACTOR WND ALEXIS 18 MED (MISCELLANEOUS) IMPLANT
RTRCTR WOUND ALEXIS 18CM MED (MISCELLANEOUS)
RTRCTR WOUND ALEXIS 18CM SML (INSTRUMENTS) ×2
SAVER CELL AAL HAEMONETICS (INSTRUMENTS) ×1 IMPLANT
SPONGE LAP 18X18 X RAY DECT (DISPOSABLE) ×1 IMPLANT
STAPLER VISISTAT 35W (STAPLE) IMPLANT
SUT ETHIBOND NAB CT1 #1 30IN (SUTURE) ×4 IMPLANT
SUT MNCRL AB 3-0 PS2 18 (SUTURE) IMPLANT
SUT VIC AB 0 CT1 27 (SUTURE) ×2
SUT VIC AB 0 CT1 27XBRD ANBCTR (SUTURE) ×1 IMPLANT
SUT VIC AB 1 CT1 27 (SUTURE) ×4
SUT VIC AB 1 CT1 27XBRD ANBCTR (SUTURE) ×2 IMPLANT
SUT VIC AB 2-0 CT1 27 (SUTURE) ×2
SUT VIC AB 2-0 CT1 TAPERPNT 27 (SUTURE) ×1 IMPLANT
SYR 50ML LL SCALE MARK (SYRINGE) ×2 IMPLANT
TOWEL OR 17X24 6PK STRL BLUE (TOWEL DISPOSABLE) ×2 IMPLANT
TOWEL OR 17X26 10 PK STRL BLUE (TOWEL DISPOSABLE) ×2 IMPLANT
TRAY CATH 16FR W/PLASTIC CATH (SET/KITS/TRAYS/PACK) IMPLANT
TRAY FOLEY CATH 16FR SILVER (SET/KITS/TRAYS/PACK) IMPLANT
WATER STERILE IRR 1000ML POUR (IV SOLUTION) ×2 IMPLANT

## 2015-07-31 NOTE — Anesthesia Postprocedure Evaluation (Signed)
Anesthesia Post Note  Patient: Levi [name]  Procedure(s) Performed: Procedure(s) (LRB): TOTAL HIP ARTHROPLASTY ANTERIOR APPROACH (Right)  Patient location during evaluation: PACU Anesthesia Type: General Level of consciousness: awake and alert and oriented Pain management: pain level controlled Vital Signs Assessment: post-procedure vital signs reviewed and stable Respiratory status: spontaneous breathing, nonlabored ventilation, respiratory function stable and patient connected to nasal cannula oxygen Cardiovascular status: blood pressure returned to baseline and stable Postop Assessment: no signs of nausea or vomiting Anesthetic complications: no    Last Vitals:  Filed Vitals:   07/31/15 1631 07/31/15 1957  BP: 137/67 146/82  Pulse: 57 68  Temp: 36.4 C 36.6 C  Resp: 14 16    Last Pain:  Filed Vitals:   07/31/15 2004  PainSc: 2                  Kimesha Claxton A.

## 2015-07-31 NOTE — Op Note (Signed)
NAME:  Levi [name], Levi [name] NO.:  [id number]  MEDICAL RECORD NO.:  [id number]  LOCATION:  [id number]                        FACILITY:  [place]  PHYSICIAN:  [name], M.D.   DATE OF BIRTH:  Mar 07, 1946  DATE OF PROCEDURE:  07/31/2015 DATE OF DISCHARGE:                              OPERATIVE REPORT   PREOPERATIVE DIAGNOSIS:  End-stage degenerative joint disease, right hip with bone-on-bone change.  POSTOPERATIVE DIAGNOSIS:  End-stage degenerative joint disease, right hip with bone-on-bone change.  PROCEDURE: 1. Right total hip replacement with a Corail size 15 stem, high     offset, a +0 hip ball, 36 mm ceramic and a 58 mm pinnacle cup with     a +4 neutral liner, excepting a 36 mm ball. 2. Interpretation of multiple intraoperative fluoroscopic images.  SURGEON:  [name], M.D.  ASSISTANT:  Gary Fleet, P.A.  ANESTHESIA:  General.  BRIEF HISTORY:  Levi [name] is a 70 year old male, with a long history of complaints of right hip pain.  X-ray showed bone-on-bone change.  He was having night pain and light activity pain.  After failure of all conservative care, he was taken to the operating room for right total hip replacement.  Given his active lifestyle and young age, we felt that anterior hip was appropriate.  This was chosen to be done preoperatively.  He was brought to the operating room for this procedure.  DESCRIPTION OF PROCEDURE:  The patient was brought to the operative room.  After adequate anesthesia was obtained with general anesthetic, the patient was placed supine on the operating table.  The right hip was prepped draped in sterile fashion, after he was placed onto the Hana bed.  Following this, an incision made for an anterior approach to the hip, subcutaneous tissue down all the tensor fascia, tensor fascia was identified and retracted laterally.  Retractors were put in place. Anterior and posterior to the hip.  The vessels are cauterized  in the anterior aspect of the hip and once this was completed, the capsule is opened and tagged.  Once this was done, retractors were put back in place.  The hip was externally rotated.  The anterior capsule was released to allow additional external rotation.  Once this was completed, attention was turned towards removal of the hip ball, after provisional neck cut was made, the hip ball was removed.  Retractors put in place.  The acetabulum was sequentially reamed to a level of 57 mm and a 58 mm pinnacle cup was put in place, hammered into place with 45 degrees of lateral opening and 30 degrees of anteversion.  Once this was completed, the +4 neutral liner was placed.  Once this was completed, attention was turned to the stem side, it is Hana bed hook was put under the femur and it is externally rotated, taken it down over position. Following this, sequentially rasped to a level of 15 mm and a high offset 15 mm trial was placed with a +5 ball initially and this gives [place] too long of a leg length.  We went trailed a +0 ball.  This gave [place] symmetric leg length.  Following this, the  final components were opened.  A size 15 high offset, Corail stem was placed followed by a +0 delta ceramic hip ball 36 mm and this was reduced.  Final images were taken. Leg lengths were perfectly symmetric.  Hip looks to be the perfect size. Once this was done, the wound was irrigated and suctioned dry.  The anterior capsule was closed with 1 Vicryl running.  The tensor fascia is closed with 1 Vicryl running.  The skin was closed with 0 and 2-0 Vicryl, and 3-0 Monocryl subcuticular.  Benzoin and Steri-Strips were applied.  Sterile compressive dressing was applied.  The patient was taken to the recovery to be in satisfactory condition.  Estimated loss procedure is 300 mL, although the final estimate can be achieved from the anesthetic record.  Of note, during the case, there were multiple fluoroscopic images  taken and these were assessed by me intraoperatively to assess sizing and leg length and adequacy of acetabular placement.     [name], M.D.     Corliss Skains  D:  07/31/2015  T:  07/31/2015  Job:  [id number]

## 2015-07-31 NOTE — Anesthesia Preprocedure Evaluation (Addendum)
Anesthesia Evaluation  Patient identified by MRN, date of birth, ID band Patient awake    Reviewed: Allergy & Precautions, NPO status , Patient's Chart, lab work & pertinent test results  Airway Mallampati: I  TM Distance: <3 FB Neck ROM: Full    Dental no notable dental hx. (+) Dental Advisory Given, Teeth Intact   Pulmonary neg pulmonary ROS, shortness of breath and with exertion, asthma , COPD,  COPD inhaler,  Agent Orange exposure   Pulmonary exam normal breath sounds clear to auscultation       Cardiovascular Normal cardiovascular exam+ dysrhythmias Atrial Fibrillation  Rhythm:Regular Rate:Normal     Neuro/Psych negative neurological ROS  negative psych ROS   GI/Hepatic Neg liver ROS, hiatal hernia, GERD  Medicated and Controlled,  Endo/Other  negative endocrine ROSHyperlipidemia Hyperuricemia  Renal/GU negative Renal ROS   Enlarged prostate    Musculoskeletal negative musculoskeletal ROS (+) Arthritis , Osteoarthritis,  OA Right Hip DDD lumbar spine   Abdominal   Peds negative pediatric ROS (+)  Hematology negative hematology ROS (+)   Anesthesia Other Findings   Reproductive/Obstetrics negative OB ROS                            Anesthesia Physical  Anesthesia Plan  ASA: III  Anesthesia Plan: General   Post-op Pain Management:    Induction: Intravenous  Airway Management Planned: Oral ETT  Additional Equipment:   Intra-op Plan:   Post-operative Plan: Extubation in OR  Informed Consent: I have reviewed the patients History and Physical, chart, labs and discussed the procedure including the risks, benefits and alternatives for the proposed anesthesia with the patient or authorized representative who has indicated his/her understanding and acceptance.   Dental advisory given  Plan Discussed with: CRNA, Surgeon and Anesthesiologist  Anesthesia Plan Comments:         Anesthesia Quick Evaluation

## 2015-07-31 NOTE — Transfer of Care (Signed)
Immediate Anesthesia Transfer of Care Note  Patient: Levi [name]  Procedure(s) Performed: Procedure(s): TOTAL HIP ARTHROPLASTY ANTERIOR APPROACH (Right)  Patient Location: PACU  Anesthesia Type:General  Level of Consciousness: awake, alert  and patient cooperative  Airway & Oxygen Therapy: Patient Spontanous Breathing and Patient connected to nasal cannula oxygen  Post-op Assessment: Report given to RN and Post -op Vital signs reviewed and stable  Post vital signs: Reviewed and stable  Last Vitals:  Filed Vitals:   07/31/15 1042  BP: 129/66  Pulse: 56  Temp: 36.6 C  Resp: 20    Complications: No apparent anesthesia complications

## 2015-07-31 NOTE — Discharge Instructions (Signed)

## 2015-07-31 NOTE — Brief Op Note (Signed)
07/31/2015  2:35 PM  PATIENT:  Levi [name]  70 y.o. male  PRE-OPERATIVE DIAGNOSIS:  Osteoarthritis right hip  POST-OPERATIVE DIAGNOSIS:  Osteoarthritis right hip  PROCEDURE:  Procedure(s): TOTAL HIP ARTHROPLASTY ANTERIOR APPROACH (Right)  SURGEON:  Surgeon(s) and Role:    * [name], MD - Primary  PHYSICIAN ASSISTANT:   ASSISTANTS: bethune   ANESTHESIA:   general  EBL:  Total I/O In: 1100 [I.V.:1100] Out: 250 [Blood:250]  BLOOD ADMINISTERED:none  DRAINS: none   LOCAL MEDICATIONS USED:  OTHER experel  SPECIMEN:  No Specimen  DISPOSITION OF SPECIMEN:  N/A  COUNTS:  YES  TOURNIQUET:  * No tourniquets in log *  DICTATION: .Other Dictation: Dictation Number [id number]  PLAN OF CARE: Admit to inpatient   PATIENT DISPOSITION:  PACU - hemodynamically stable.   Delay start of Pharmacological VTE agent (>24hrs) due to surgical blood loss or risk of bleeding: no

## 2015-07-31 NOTE — Progress Notes (Signed)
Utilization review completed.  

## 2015-07-31 NOTE — Anesthesia Procedure Notes (Signed)
Procedure Name: Intubation Date/Time: 07/31/2015 12:09 PM Performed by: Salli Quarry Ndia Sampath Pre-anesthesia Checklist: Patient identified, Emergency Drugs available, Suction available and Patient being monitored Patient Re-evaluated:Patient Re-evaluated prior to inductionOxygen Delivery Method: Circle system utilized Preoxygenation: Pre-oxygenation with 100% oxygen Intubation Type: IV induction Ventilation: Mask ventilation without difficulty Laryngoscope Size: Mac and 3 Grade View: Grade III Tube type: Oral Tube size: 8.0 mm Number of attempts: 1 Airway Equipment and Method: Stylet Placement Confirmation: positive ETCO2 and breath sounds checked- equal and bilateral Secured at: 23 cm Tube secured with: Tape Dental Injury: Teeth and Oropharynx as per pre-operative assessment

## 2015-08-01 ENCOUNTER — Encounter (HOSPITAL_COMMUNITY): Payer: Self-pay | Admitting: Orthopedic Surgery

## 2015-08-01 LAB — BASIC METABOLIC PANEL
Anion gap: 9 (ref 5–15)
BUN: 16 mg/dL (ref 6–20)
CO2: 25 mmol/L (ref 22–32)
Calcium: 9 mg/dL (ref 8.9–10.3)
Chloride: 105 mmol/L (ref 101–111)
Creatinine, Ser: 0.81 mg/dL (ref 0.61–1.24)
GFR calc Af Amer: >60 mL/min (ref >60)
GFR calc non Af Amer: >60 mL/min (ref >60)
Glucose, Bld: 141 mg/dL — ABNORMAL HIGH (ref 65–99)
Potassium: 4.4 mmol/L (ref 3.5–5.1)
Sodium: 139 mmol/L (ref 135–145)

## 2015-08-01 LAB — CBC
HCT: 32.1 % — ABNORMAL LOW (ref 39.0–52.0)
Hemoglobin: 10.6 g/dL — ABNORMAL LOW (ref 13.0–17.0)
MCH: 29.1 pg (ref 26.0–34.0)
MCHC: 33 g/dL (ref 30.0–36.0)
MCV: 88.2 fL (ref 78.0–100.0)
Platelets: 181 10*3/uL (ref 150–400)
RBC: 3.64 MIL/uL — ABNORMAL LOW (ref 4.22–5.81)
RDW: 16.5 % — ABNORMAL HIGH (ref 11.5–15.5)
WBC: 17.1 10*3/uL — ABNORMAL HIGH (ref 4.0–10.5)

## 2015-08-01 NOTE — Progress Notes (Signed)
Subjective: 1 Day Post-Op Procedure(s) (LRB): TOTAL HIP ARTHROPLASTY ANTERIOR APPROACH (Right) Patient reports pain as mild.  Taking by mouth and voiding okay. Has done many laps around the orthopedic floor with physical therapy. Reports she is ready to go home.  Objective: Vital signs in last 24 hours: Temp:  [97 F (36.1 C)-98.6 F (37 C)] 98 F (36.7 C) (03/07 0517) Pulse Rate:  [46-75] 67 (03/07 0517) Resp:  [6-20] 17 (03/07 0517) BP: (125-146)/(66-82) 146/76 mmHg (03/07 0517) SpO2:  [90 %-100 %] 98 % (03/07 0517) Weight:  [71.215 kg (157 lb)] 71.215 kg (157 lb) (03/06 1042)  Intake/Output from previous day: 03/06 0701 - 03/07 0700 In: 1310 [I.V.:1100; IV Piggyback:210] Out: 550 [Urine:300; Blood:250] Intake/Output this shift:     Recent Labs  08/01/15 0527  HGB 10.6*    Recent Labs  08/01/15 0527  WBC 17.1*  RBC 3.64*  HCT 32.1*  PLT 181    Recent Labs  08/01/15 0527  NA 139  K 4.4  CL 105  CO2 25  BUN 16  CREATININE 0.81  GLUCOSE 141*  CALCIUM 9.0   No results for input(s): LABPT, INR in the last 72 hours. Right hip exam: Neurovascular intact Sensation intact distally Intact pulses distally Dorsiflexion/Plantar flexion intact Incision: dressing C/D/I Compartment soft  Assessment/Plan: 1 Day Post-Op Procedure(s) (LRB): TOTAL HIP ARTHROPLASTY ANTERIOR APPROACH (Right) Plan: Discharge home today. Will start outpatient physical therapy. Aspirin 325 mg twice daily for DVT prophylaxis. Weight-bear as tolerated on right. Follow-up with Dr. Berenice Primas in 2 weeks.   Sladen Plancarte G 08/01/2015, 9:41 AM

## 2015-08-01 NOTE — Discharge Summary (Signed)
Patient ID: Levi [name] MRN: [id number] DOB/AGE: [birth year] 69 y.o.  Admit date: 07/31/2015 Discharge date: 08/01/2015  Admission Diagnoses:  Principal Problem:   Primary osteoarthritis of right hip   Discharge Diagnoses:  Same  Past Medical History  Diagnosis Date  . Asthma   . ALLERGIC RHINITIS   . Hyperlipidemia     takes Lipitor daily; pt. states he takes a preventive  . Agent orange exposure 1970's    takes Imdur,Diltiazem, and Atacand daily  . Joint pain   . Joint swelling   . Elevated uric acid in blood     takes Allopurinol daily  . Bruises easily     d/t prednisone daily  . GERD (gastroesophageal reflux disease)     takes Protonix daily  . Diverticulitis   . Enlarged prostate     but not on any meds  . History of pneumonia     a. 2010  . Chronic bronchitis ([place])     "used to get it q yr; last time ?2008" (04/28/2012)  . H/O hiatal hernia   . Degenerative disk disease     "qwhere" (04/28/2012)  . Personal history of colonic adenomas 02/09/2013  . PAF (paroxysmal atrial fibrillation) ([facility])     a. Dx 04/2012, CHA2DS2VASc = 1 (age);  b. 04/2012 Echo: EF 40-50%, mild MR.  [name] Dysrhythmia     afib  . COPD (chronic obstructive pulmonary disease) ([facility])     agent orange exposure  . Shortness of breath dyspnea     Surgeries: Procedure(s): Right TOTAL HIP ARTHROPLASTY ANTERIOR APPROACH on 07/31/2015    Discharged Condition: Improved  Hospital Course: TAURIS FAHRENKRUG is an 70 y.o. male who was admitted 07/31/2015 for operative treatment ofPrimary osteoarthritis of right hip. Patient has severe unremitting pain that affects sleep, daily activities, and work/hobbies. After pre-op clearance the patient was taken to the operating room on 07/31/2015 and underwent  Procedure(s): Right TOTAL HIP ARTHROPLASTY ANTERIOR APPROACH.    Patient was given perioperative antibiotics: Anti-infectives    Start     Dose/Rate Route Frequency Ordered Stop   07/31/15 1800  ceFAZolin (ANCEF) IVPB  2 g/50 mL premix     2 g 100 mL/hr over 30 Minutes Intravenous Every 6 hours 07/31/15 1708 08/01/15 0059   07/31/15 1200  ceFAZolin (ANCEF) IVPB 2 g/50 mL premix     2 g 100 mL/hr over 30 Minutes Intravenous To ShortStay Surgical 07/28/15 1010 07/31/15 1221       Patient was given sequential compression devices, early ambulation, and chemoprophylaxis to prevent DVT. On postop day #1 patient was ambulating in the hall with physical therapy without difficulty. He had no shortness of breath or chest pain. He was taking by mouth and voiding okay. His right hip dressing was clean and dry.  Patient benefited maximally from hospital stay and there were no complications.    Recent vital signs: Patient Vitals for the past 24 hrs:  BP Temp Temp src Pulse Resp SpO2 Height Weight  08/01/15 0517 (!) 146/76 mmHg 98 F (36.7 C) Oral 67 17 98 % - -  08/01/15 0120 129/79 mmHg 98.6 F (37 C) - 71 17 97 % - -  07/31/15 2053 134/76 mmHg 97.5 F (36.4 C) Oral (!) 55 17 97 % - -  07/31/15 1957 (!) 146/82 mmHg 97.8 F (36.6 C) Oral 68 16 100 % - -  07/31/15 1955 - - - - - 100 % - -  07/31/15 1631 137/67 mmHg 97.5  F (36.4 C) - (!) 57 14 100 % - -  07/31/15 1620 137/71 mmHg - - (!) 57 13 100 % - -  07/31/15 1606 126/69 mmHg - - (!) 55 16 100 % - -  07/31/15 1551 132/69 mmHg - - (!) 47 (!) 6 100 % - -  07/31/15 1536 126/67 mmHg - - (!) 46 (!) 7 100 % - -  07/31/15 1521 135/70 mmHg - - (!) 53 15 100 % - -  07/31/15 1506 136/68 mmHg - - (!) 55 15 100 % - -  07/31/15 1457 130/71 mmHg - - (!) 55 14 100 % - -  07/31/15 1436 125/71 mmHg - - 63 19 100 % - -  07/31/15 1421 131/68 mmHg - - 75 - 90 % - -  07/31/15 1420 - 97 F (36.1 C) - - - - - -  07/31/15 1054 - - - - - - 6\' 2"  (1.88 m) -  07/31/15 1042 129/66 mmHg 97.9 F (36.6 C) Oral (!) 56 20 100 % - 71.215 kg (157 lb)     Recent laboratory studies:  Recent Labs  08/01/15 0527  WBC 17.1*  HGB 10.6*  HCT 32.1*  PLT 181  NA 139  K 4.4  CL 105   CO2 25  BUN 16  CREATININE 0.81  GLUCOSE 141*  CALCIUM 9.0     Discharge Medications:     Medication List    STOP taking these medications        amoxicillin-clavulanate 875-125 MG tablet  Commonly known as:  AUGMENTIN     NONFORMULARY OR COMPOUNDED ITEM      TAKE these medications        allopurinol 100 MG tablet  Commonly known as:  ZYLOPRIM  Take 100 mg by mouth daily.     aspirin EC 325 MG tablet  Take 1 tablet (325 mg total) by mouth 2 (two) times daily after a meal. Take x 1 month post op to decrease risk of blood clots.     atorvastatin 10 MG tablet  Commonly known as:  LIPITOR  TAKE 1 TABLET (10 MG TOTAL) BY MOUTH DAILY.     beclomethasone 80 MCG/ACT inhaler  Commonly known as:  QVAR  Inhale 2 puffs into the lungs 2 (two) times daily.     candesartan 4 MG tablet  Commonly known as:  ATACAND  TAKE 0.5 TABLETS (2 MG TOTAL) BY MOUTH DAILY.     cyclobenzaprine 10 MG tablet  Commonly known as:  FLEXERIL  Take 10 mg by mouth 3 (three) times daily as needed for muscle spasms.     diltiazem 180 MG 24 hr capsule  Commonly known as:  CARDIZEM CD  TAKE 1 CAPSULE (180 MG TOTAL) BY MOUTH DAILY.     diltiazem 30 MG tablet  Commonly known as:  CARDIZEM  Take 1 tablet every 4 hours AS NEEDED for afib HR >100 and BP >100     isosorbide mononitrate 30 MG 24 hr tablet  Commonly known as:  IMDUR  TAKE 1 TABLET (30 MG TOTAL) BY MOUTH DAILY.     Olodaterol HCl 2.5 MCG/ACT Aers  Commonly known as:  STRIVERDI RESPIMAT  Inhale 2 puffs into the lungs daily.     oxyCODONE-acetaminophen 5-325 MG tablet  Commonly known as:  PERCOCET/ROXICET  Take 1-2 tablets by mouth every 4 (four) hours as needed for severe pain.     pantoprazole 40 MG tablet  Commonly known as:  PROTONIX  TAKE 1 TABLET BY MOUTH TWICE A DAY     predniSONE 10 MG tablet  Commonly known as:  DELTASONE  Take 1 tablet (10 mg total) by mouth daily. continuous     PROVENTIL HFA 108 (90 Base) MCG/ACT  inhaler  Generic drug:  albuterol  INHALE 2 PUFFS INTO THE LUNGS EVERY 4 (FOUR) HOURS AS NEEDED FOR WHEEZING.     RAPAFLO 8 MG Caps capsule  Generic drug:  silodosin  Take 8 mg by mouth daily with breakfast.        Diagnostic Studies: Dg Chest 2 View  07/25/2015  CLINICAL DATA:  Preoperative exam prior total distant hip arthroplasty on the right, history of COPD, atrial fibrillation, and hiatal hernia. EXAM: CHEST  2 VIEW COMPARISON:  PA and lateral chest x-ray of June 10, 2013 and CT scan of the chest of November 15, 2014 FINDINGS: The lungs are mildly hyperinflated with hemidiaphragm flattening. There is no focal infiltrate. There is no pleural effusion. The heart is normal in size. The pulmonary vascularity is not engorged. The mediastinum is normal in width. The bony thorax is unremarkable. There is a prosthetic left shoulder. There are posterior fusion devices in the upper lumbar spine. IMPRESSION: COPD. There is no evidence of pneumonia, CHF, nor other acute cardiopulmonary disease. Electronically Signed   By: David  Martinique M.D.   On: 07/25/2015 09:02   Dg Hip Operative Unilat With Pelvis Right  07/31/2015  CLINICAL DATA:  Hip replacement. EXAM: OPERATIVE right HIP (WITH PELVIS IF PERFORMED)  VIEWS TECHNIQUE: Fluoroscopic spot image(s) were submitted for interpretation post-operatively. COMPARISON:  No recent. FINDINGS: Total right hip replacement good anatomic alignment. Hardware intact. Two images obtained. 0 minutes 27 seconds fluoroscopy time. IMPRESSION: Total right hip replacement with good anatomic alignment. Electronically Signed   By: Marcello Moores  Register   On: 07/31/2015 13:52    Disposition: 01-Home or Self Care      Discharge Instructions    Call MD / Call 911    Complete by:  As directed   If you experience chest pain or shortness of breath, CALL 911 and be transported to the hospital emergency room.  If you develope a fever above 101 F, pus (white drainage) or increased drainage  or redness at the wound, or calf pain, call your surgeon's office.     Constipation Prevention    Complete by:  As directed   Drink plenty of fluids.  Prune juice may be helpful.  You may use a stool softener, such as Colace (over the counter) 100 mg twice a day.  Use MiraLax (over the counter) for constipation as needed.     Diet general    Complete by:  As directed      Do not sit on low chairs, stoools or toilet seats, as it may be difficult to get up from low surfaces    Complete by:  As directed      Increase activity slowly as tolerated    Complete by:  As directed      Weight bearing as tolerated    Complete by:  As directed   Laterality:  right  Extremity:  Lower     Weight bearing as tolerated    Complete by:  As directed   Laterality:  right  Extremity:  Lower           Follow-up Information    Follow up with GRAVES,Ladarrell L, MD. Schedule an appointment as soon as possible for  a visit in 2 weeks.   Specialty:  Orthopedic Surgery   Contact information:   [place] [address] [phone]        Signed: Erlene Senters 08/01/2015, 9:44 AM

## 2015-08-01 NOTE — Evaluation (Signed)
Occupational Therapy Evaluation AND Discharge  Patient Details Name: Levi [name] MRN: [id number] DOB: 1946/01/04 Today's Date: 08/01/2015    History of Present Illness 70 yo admitted for R THA anterior approach. PMHx: R TKA, L THA, lumbar fusion, asthma, agent Orange exposure, Afit, COPD, Reverse TSA, ACDF   Clinical Impression   Patient admitted with above. Patient independent and an active runner PTA. Patient currently functioning at an overall mod I level using DME and AE prn.  No additional OT needs identified, D/C from acute OT services and no additional follow-up OT needs at this time. All appropriate education provided to patient. Please re-order OT if needed.      Follow Up Recommendations  No OT follow up;Supervision - Intermittent    Equipment Recommendations  None recommended by OT    Recommendations for Other Services  None at this time    Precautions / Restrictions Precautions Precautions: Fall Precaution Comments: no hip precautions - direct anterior approach Restrictions Weight Bearing Restrictions: Yes RLE Weight Bearing: Weight bearing as tolerated    Mobility Bed Mobility Overal bed mobility: Modified Independent  Transfers Overall transfer level: Modified independent Equipment used: Rolling walker (2 wheeled)   Balance Overall balance assessment: No apparent balance deficits (not formally assessed) (RW for safety)    ADL Overall ADL's : At baseline;Modified independent General ADL Comments: Pt states he uses a sock aid and shoe horn for LB ADLs. Pt able to stand at sink using RW prn for grooming task of washing face and shaving.     Vision Additional Comments: no change from baseline          Pertinent Vitals/Pain Pain Assessment: No/denies pain     Hand Dominance Right   Extremity/Trunk Assessment Upper Extremity Assessment Upper Extremity Assessment: Overall WFL for tasks assessed   Lower Extremity Assessment Lower Extremity Assessment:  Defer to PT evaluation RLE Deficits / Details: decreased strength and ROM post op   Cervical / Trunk Assessment Cervical / Trunk Assessment: Normal   Communication Communication Communication: No difficulties   Cognition Arousal/Alertness: Awake/alert Behavior During Therapy: WFL for tasks assessed/performed Overall Cognitive Status: Within Functional Limits for tasks assessed              Home Living Family/patient expects to be discharged to:: Private residence Living Arrangements: Spouse/significant other Available Help at Discharge: Family;Available 24 hours/day Type of Home: House Home Access: Stairs to enter [employer] of Steps: 2 Entrance Stairs-Rails: Left Home Layout: Multi-level;Able to live on main level with bedroom/bathroom Alternate Level Stairs-Number of Steps: 12 Alternate Level Stairs-Rails: Right Bathroom Shower/Tub: Occupational psychologist: Standard     Home Equipment: Environmental consultant - 2 wheels;Cane - single point;Bedside commode;Hand held shower head;Shower seat;Shower seat - built in   Prior Functioning/Environment Level of Independence: Independent  Comments: active and is still running, ran 3 mi PTA    OT Diagnosis: Generalized weakness   OT Problem List:   N/a, no acute OT needs identified at this time     OT Treatment/Interventions:   N/a, no acute OT needs identified at this time     OT Goals(Current goals can be found in the care plan section) Acute Rehab OT Goals Patient Stated Goal: go home today OT Goal Formulation: All assessment and education complete, DC therapy  OT Frequency:   N/a, no acute OT needs identified at this time     Barriers to D/C:  None known at this time    End of  Session Equipment Utilized During Treatment: Rolling walker  Activity Tolerance: Patient tolerated treatment well Patient left:  (ambulating around room at mod I level)   Time: [id number] OT Time Calculation (min): 11 min Charges:  OT  General Charges $OT Visit: 1 Procedure OT Evaluation $OT Eval Moderate Complexity: 1 Procedure  Chrys Racer , MS, OTR/L, CLT Pager: [phone]  08/01/2015, 10:47 AM

## 2015-08-01 NOTE — Progress Notes (Signed)
Physical Therapy Progress Note: Levi [name] continues to excel with mobility and function post op able to walk the unit without assist of DME at this time.Education for transfers and HEp provided with pt safe for D/c home with wife.    08/01/15 1200  PT Visit Information  Last PT Received On 08/01/15  Assistance Needed +1  History of Present Illness 70 yo admitted for R THA anterior approach. PMHx: R TKA, L THA, lumbar fusion, asthma, agent Orange exposure, Afit, COPD, Reverse TSA, ACDF  PT Time Calculation  PT Start Time (ACUTE ONLY) 1119  PT Stop Time (ACUTE ONLY) 1130  PT Time Calculation (min) (ACUTE ONLY) 11 min  Precautions  Precautions Fall  Pain Assessment  Pain Assessment No/denies pain  Cognition  Arousal/Alertness Awake/alert  Behavior During Therapy WFL for tasks assessed/performed  Overall Cognitive Status Within Functional Limits for tasks assessed  Transfers  Overall transfer level Modified independent  Ambulation/Gait  Ambulation/Gait assistance Independent  Ambulation Distance (Feet) 500 Feet  Assistive device None  Gait Pattern/deviations Step-through pattern;Decreased stance time - right  Gait velocity interpretation at or above normal speed for age/gender  Exercises  Exercises Total Joint  Total Joint Exercises  Hip ABduction/ADduction AROM;Right;15 reps;Standing  Long Arc Quad AROM;Right;15 reps;Seated  Knee Flexion AROM;Right;15 reps;Standing  Marching in Standing AROM;Right;15 reps;Standing  Standing Hip Extension AROM;Right;15 reps;Standing  PT - End of Session  Activity Tolerance Patient tolerated treatment well  Patient left in chair;with call bell/phone within [name];with family/visitor present  Nurse Communication Mobility status  PT - Assessment/Plan  PT Plan Current plan remains appropriate  Follow Up Recommendations Outpatient PT  PT Goal Progression  Progress towards PT goals Progressing toward goals  PT General Charges  $$ ACUTE PT VISIT 1  Procedure  PT Treatments  $Gait Training 8-22 mins  Levi [name], Levi [name]

## 2015-08-01 NOTE — Evaluation (Signed)
Physical Therapy Evaluation Patient Details Name: Levi [name] MRN: [id number] DOB: 27-Nov-1945 Today's Date: 08/01/2015   History of Present Illness  70 yo admitted for R THA anterior approach. PMHx: R TKA, L THA, lumbar fusion, asthma, agent Orange exposure, Afit, COPD, Reverse TSA, ACDF  Clinical Impression  Pt very pleasant and moving well with cues for sequence and RW use. Pt educated for gait, transfers and plan. Pt with decreased strength and ROM RLE as well as decreased transfers who will benefit from acute therapy to maximize mobility, function, gait and independence to return to PLOF.     Follow Up Recommendations Outpatient PT    Equipment Recommendations  None recommended by PT    Recommendations for Other Services       Precautions / Restrictions Restrictions RLE Weight Bearing: Weight bearing as tolerated      Mobility  Bed Mobility Overal bed mobility: Modified Independent                Transfers Overall transfer level: Modified independent                  Ambulation/Gait Ambulation/Gait assistance: Supervision Ambulation Distance (Feet): 500 Feet Assistive device: Rolling walker (2 wheeled) Gait Pattern/deviations: Step-through pattern;Decreased stride length   Gait velocity interpretation: at or above normal speed for age/gender General Gait Details: cues for position in RW  Stairs Stairs: Yes Stairs assistance: Modified independent (Device/Increase time) Stair Management: Step to pattern;Forwards;One rail Left Number of Stairs: 5 General stair comments: cues for sequence with pt able to return demonstrate  Wheelchair Mobility    Modified Rankin (Stroke Patients Only)       Balance                                             Pertinent Vitals/Pain Pain Assessment: No/denies pain    Home Living Family/patient expects to be discharged to:: Private residence Living Arrangements: Spouse/significant  other Available Help at Discharge: Family;Available 24 hours/day Type of Home: House Home Access: Stairs to enter Entrance Stairs-Rails: Left Entrance Stairs-Number of Steps: 2 Home Layout: Multi-level;Able to live on main level with bedroom/bathroom Home Equipment: Gilford Rile - 2 wheels;Cane - single point;Bedside commode      Prior Function Level of Independence: Independent         Comments: active and is still running, ran 3 mi PTA     Hand Dominance        Extremity/Trunk Assessment   Upper Extremity Assessment: Overall WFL for tasks assessed           Lower Extremity Assessment: RLE deficits/detail RLE Deficits / Details: decreased strength and ROM post op    Cervical / Trunk Assessment: Normal  Communication   Communication: No difficulties  Cognition Arousal/Alertness: Awake/alert Behavior During Therapy: WFL for tasks assessed/performed Overall Cognitive Status: Within Functional Limits for tasks assessed                      General Comments      Exercises Total Joint Exercises Heel Slides: AROM;Right;10 reps;Supine Hip ABduction/ADduction: AROM;Right;10 reps;Supine      Assessment/Plan    PT Assessment Patient needs continued PT services  PT Diagnosis Difficulty walking   PT Problem List Decreased strength;Decreased range of motion;Decreased activity tolerance;Decreased mobility  PT Treatment Interventions Gait training;Functional mobility training;Therapeutic activities;Therapeutic exercise;Patient/family education;DME instruction  PT Goals (Current goals can be found in the Care Plan section) Acute Rehab PT Goals Patient Stated Goal: go home today PT Goal Formulation: With patient Time For Goal Achievement: 08/08/15 Potential to Achieve Goals: Good    Frequency 7X/week   Barriers to discharge        Co-evaluation               End of Session Equipment Utilized During Treatment: Gait belt Activity Tolerance: Patient  tolerated treatment well Patient left: in chair;with call bell/phone within reach;with nursing/sitter in room Nurse Communication: Mobility status         Time: [id number] PT Time Calculation (min) (ACUTE ONLY): 19 min   Charges:   PT Evaluation $PT Eval Low Complexity: 1 Procedure     PT G CodesMelford Aase 08/01/2015, 9:21 AM Elwyn Reach, [place]

## 2015-08-01 NOTE — Care Management Note (Signed)
Case Management Note  Patient Details  Name: Levi [name] MRN: [id number] Date of Birth: [birth year]  Subjective/Objective:   70 yr old gentleman s/p right total hip arthroplasty.                 Action/Plan: Case manager spoke with patient concerning discharge plan. Patient is doing well and will go straight to out patient therapy.Gaspar Skeeters, PA, will fax prescription to [facility] outpatient physical therapy. Patient states he has rolling walker, 3in1 from previous surgery. Will have family support at discharge.   Expected Discharge Date:   08/01/15               Expected Discharge Plan:   home/selfcare  In-House Referral:     Discharge planning Services  CM Consult  Post Acute Care Choice:    Choice offered to:  NA  DME Arranged:  N/A DME Agency:  NA  HH Arranged:    [place] Agency:  NA  Status of Service:  Completed, signed off  Medicare Important Message Given:    Date Medicare IM Given:    Medicare IM give by:    Date Additional Medicare IM Given:    Additional Medicare Important Message give by:     If discussed at [place] of Stay Meetings, dates discussed:    Additional Comments:  Ninfa Meeker, RN 08/01/2015, 9:56 AM

## 2015-08-04 ENCOUNTER — Ambulatory Visit (INDEPENDENT_AMBULATORY_CARE_PROVIDER_SITE_OTHER): Payer: Medicare Other | Admitting: *Deleted

## 2015-08-04 DIAGNOSIS — J309 Allergic rhinitis, unspecified: Secondary | ICD-10-CM | POA: Diagnosis not present

## 2015-08-08 DIAGNOSIS — M25551 Pain in right hip: Secondary | ICD-10-CM | POA: Diagnosis not present

## 2015-08-11 ENCOUNTER — Ambulatory Visit (INDEPENDENT_AMBULATORY_CARE_PROVIDER_SITE_OTHER): Payer: Medicare Other | Admitting: *Deleted

## 2015-08-11 DIAGNOSIS — J309 Allergic rhinitis, unspecified: Secondary | ICD-10-CM

## 2015-08-11 DIAGNOSIS — M25551 Pain in right hip: Secondary | ICD-10-CM | POA: Diagnosis not present

## 2015-08-14 DIAGNOSIS — M25551 Pain in right hip: Secondary | ICD-10-CM | POA: Diagnosis not present

## 2015-08-16 ENCOUNTER — Other Ambulatory Visit: Payer: Self-pay | Admitting: Internal Medicine

## 2015-08-16 DIAGNOSIS — M25551 Pain in right hip: Secondary | ICD-10-CM | POA: Diagnosis not present

## 2015-08-17 ENCOUNTER — Ambulatory Visit (INDEPENDENT_AMBULATORY_CARE_PROVIDER_SITE_OTHER): Payer: Medicare Other | Admitting: *Deleted

## 2015-08-17 DIAGNOSIS — Z96641 Presence of right artificial hip joint: Secondary | ICD-10-CM | POA: Diagnosis not present

## 2015-08-17 DIAGNOSIS — J309 Allergic rhinitis, unspecified: Secondary | ICD-10-CM

## 2015-08-17 DIAGNOSIS — M1611 Unilateral primary osteoarthritis, right hip: Secondary | ICD-10-CM | POA: Diagnosis not present

## 2015-08-17 DIAGNOSIS — Z471 Aftercare following joint replacement surgery: Secondary | ICD-10-CM | POA: Diagnosis not present

## 2015-08-17 DIAGNOSIS — Z9889 Other specified postprocedural states: Secondary | ICD-10-CM | POA: Diagnosis not present

## 2015-08-22 ENCOUNTER — Ambulatory Visit (INDEPENDENT_AMBULATORY_CARE_PROVIDER_SITE_OTHER): Payer: Medicare Other | Admitting: *Deleted

## 2015-08-22 ENCOUNTER — Other Ambulatory Visit: Payer: Self-pay | Admitting: Cardiology

## 2015-08-22 DIAGNOSIS — J309 Allergic rhinitis, unspecified: Secondary | ICD-10-CM

## 2015-08-26 ENCOUNTER — Other Ambulatory Visit: Payer: Self-pay | Admitting: Cardiology

## 2015-08-28 ENCOUNTER — Telehealth: Payer: Self-pay | Admitting: Internal Medicine

## 2015-08-28 DIAGNOSIS — J309 Allergic rhinitis, unspecified: Secondary | ICD-10-CM | POA: Diagnosis not present

## 2015-08-28 NOTE — Telephone Encounter (Signed)
Allergy Serum Extract Date Mixed: 08/28/15 Vial: 1 Strength: 1:10 Here/Mail/Pick Up: here Mixed By: tbs Last OV: 04/27/15 Pending OV: 10/26/15

## 2015-08-29 ENCOUNTER — Ambulatory Visit (INDEPENDENT_AMBULATORY_CARE_PROVIDER_SITE_OTHER): Payer: Medicare Other | Admitting: *Deleted

## 2015-08-29 DIAGNOSIS — J309 Allergic rhinitis, unspecified: Secondary | ICD-10-CM

## 2015-08-31 DIAGNOSIS — M25551 Pain in right hip: Secondary | ICD-10-CM | POA: Diagnosis not present

## 2015-09-05 ENCOUNTER — Ambulatory Visit (INDEPENDENT_AMBULATORY_CARE_PROVIDER_SITE_OTHER): Payer: Medicare Other | Admitting: *Deleted

## 2015-09-05 DIAGNOSIS — J309 Allergic rhinitis, unspecified: Secondary | ICD-10-CM

## 2015-09-12 ENCOUNTER — Ambulatory Visit (INDEPENDENT_AMBULATORY_CARE_PROVIDER_SITE_OTHER): Payer: Medicare Other | Admitting: *Deleted

## 2015-09-12 DIAGNOSIS — J309 Allergic rhinitis, unspecified: Secondary | ICD-10-CM

## 2015-09-15 DIAGNOSIS — M2041 Other hammer toe(s) (acquired), right foot: Secondary | ICD-10-CM | POA: Diagnosis not present

## 2015-09-15 DIAGNOSIS — M2042 Other hammer toe(s) (acquired), left foot: Secondary | ICD-10-CM | POA: Diagnosis not present

## 2015-09-15 DIAGNOSIS — D2372 Other benign neoplasm of skin of left lower limb, including hip: Secondary | ICD-10-CM | POA: Diagnosis not present

## 2015-09-15 DIAGNOSIS — D2371 Other benign neoplasm of skin of right lower limb, including hip: Secondary | ICD-10-CM | POA: Diagnosis not present

## 2015-09-19 ENCOUNTER — Ambulatory Visit (INDEPENDENT_AMBULATORY_CARE_PROVIDER_SITE_OTHER): Payer: Medicare Other | Admitting: *Deleted

## 2015-09-19 DIAGNOSIS — J309 Allergic rhinitis, unspecified: Secondary | ICD-10-CM

## 2015-09-26 ENCOUNTER — Ambulatory Visit (INDEPENDENT_AMBULATORY_CARE_PROVIDER_SITE_OTHER): Payer: Medicare Other | Admitting: *Deleted

## 2015-09-26 DIAGNOSIS — J309 Allergic rhinitis, unspecified: Secondary | ICD-10-CM

## 2015-10-03 ENCOUNTER — Ambulatory Visit (INDEPENDENT_AMBULATORY_CARE_PROVIDER_SITE_OTHER): Payer: Medicare Other | Admitting: *Deleted

## 2015-10-03 DIAGNOSIS — J309 Allergic rhinitis, unspecified: Secondary | ICD-10-CM | POA: Diagnosis not present

## 2015-10-09 ENCOUNTER — Telehealth: Payer: Self-pay | Admitting: Internal Medicine

## 2015-10-09 ENCOUNTER — Ambulatory Visit (INDEPENDENT_AMBULATORY_CARE_PROVIDER_SITE_OTHER): Payer: Medicare Other | Admitting: *Deleted

## 2015-10-09 DIAGNOSIS — J309 Allergic rhinitis, unspecified: Secondary | ICD-10-CM | POA: Diagnosis not present

## 2015-10-09 DIAGNOSIS — D2372 Other benign neoplasm of skin of left lower limb, including hip: Secondary | ICD-10-CM | POA: Diagnosis not present

## 2015-10-09 MED ORDER — PREDNISONE 10 MG PO TABS: ORAL_TABLET | ORAL | Status: DC

## 2015-10-09 MED ORDER — AZITHROMYCIN 250 MG PO TABS: 250.0000 mg | ORAL_TABLET | ORAL | Status: DC

## 2015-10-09 NOTE — Telephone Encounter (Signed)
Rx sent to [facility] - pt aware. Nothing further needed.

## 2015-10-09 NOTE — Telephone Encounter (Signed)
Offer Zpak and prednisone 10 mg, # 20, 4 X 2 DAYS, 3 X 2 DAYS, 2 X 2 DAYS, 1 X 2 DAYS

## 2015-10-09 NOTE — Telephone Encounter (Signed)
Pt c/o increased SOB and cough at night x 10 days.  Pt reports having increased amounts of Brayam Boeke mucus, wheezing, chest tightness and sweats Denies fever.  Pt did get allergy injections this morning - states he forgot to mention that he was feeling bad.  Please advise Dr Annamaria Boots. Thanks.  Allergies  Allergen Reactions  . Morphine And Related Palpitations    Light headed and sweaty; "my heart races" (04/28/2012), pt states too much morphine gave him this reaction  . Levaquin [Levofloxacin] Itching     Medication List       This list is accurate as of: 10/09/15  2:35 PM.  Always use your most recent med list.               allopurinol 100 MG tablet  Commonly known as:  ZYLOPRIM  Take 100 mg by mouth daily.     aspirin EC 325 MG tablet  Take 1 tablet (325 mg total) by mouth 2 (two) times daily after a meal. Take x 1 month post op to decrease risk of blood clots.     atorvastatin 10 MG tablet  Commonly known as:  LIPITOR  TAKE 1 TABLET (10 MG TOTAL) BY MOUTH DAILY.     beclomethasone 80 MCG/ACT inhaler  Commonly known as:  QVAR  Inhale 2 puffs into the lungs 2 (two) times daily.     candesartan 4 MG tablet  Commonly known as:  ATACAND  TAKE 0.5 TABLETS (2 MG TOTAL) BY MOUTH DAILY.     cyclobenzaprine 10 MG tablet  Commonly known as:  FLEXERIL  Take 10 mg by mouth 3 (three) times daily as needed for muscle spasms.     diltiazem 180 MG 24 hr capsule  Commonly known as:  CARDIZEM CD  TAKE 1 CAPSULE (180 MG TOTAL) BY MOUTH DAILY.     diltiazem 30 MG tablet  Commonly known as:  CARDIZEM  Take 1 tablet every 4 hours AS NEEDED for afib HR >100 and BP >100     isosorbide mononitrate 30 MG 24 hr tablet  Commonly known as:  IMDUR  TAKE 1 TABLET (30 MG TOTAL) BY MOUTH DAILY.     oxyCODONE-acetaminophen 5-325 MG tablet  Commonly known as:  PERCOCET/ROXICET  Take 1-2 tablets by mouth every 4 (four) hours as needed for severe pain.     pantoprazole 40 MG tablet  Commonly known  as:  PROTONIX  TAKE 1 TABLET BY MOUTH TWICE A DAY     predniSONE 10 MG tablet  Commonly known as:  DELTASONE  Take 1 tablet (10 mg total) by mouth daily. continuous     PROVENTIL HFA 108 (90 Base) MCG/ACT inhaler  Generic drug:  albuterol  INHALE 2 PUFFS INTO THE LUNGS EVERY 4 (FOUR) HOURS AS NEEDED FOR WHEEZING.     RAPAFLO 8 MG Caps capsule  Generic drug:  silodosin  Take 8 mg by mouth daily with breakfast.     STRIVERDI RESPIMAT 2.5 MCG/ACT Aers  Generic drug:  Olodaterol HCl  INHALE 2 PUFFS INTO THE LUNGS DAILY.

## 2015-10-10 ENCOUNTER — Ambulatory Visit (INDEPENDENT_AMBULATORY_CARE_PROVIDER_SITE_OTHER): Payer: Medicare Other | Admitting: Cardiology

## 2015-10-10 ENCOUNTER — Encounter: Payer: Self-pay | Admitting: Cardiology

## 2015-10-10 VITALS — BP 128/72 | HR 78 | Ht 74.0 in | Wt 162.6 lb

## 2015-10-10 DIAGNOSIS — I480 Paroxysmal atrial fibrillation: Secondary | ICD-10-CM | POA: Diagnosis not present

## 2015-10-10 DIAGNOSIS — E785 Hyperlipidemia, unspecified: Secondary | ICD-10-CM

## 2015-10-10 DIAGNOSIS — J45909 Unspecified asthma, uncomplicated: Secondary | ICD-10-CM

## 2015-10-10 DIAGNOSIS — J449 Chronic obstructive pulmonary disease, unspecified: Secondary | ICD-10-CM | POA: Diagnosis not present

## 2015-10-10 MED ORDER — ASPIRIN EC 81 MG PO TBEC: 81.0000 mg | DELAYED_RELEASE_TABLET | Freq: Every day | ORAL | Status: DC

## 2015-10-10 NOTE — Progress Notes (Signed)
[name] [address], [address], [place]  [address] Phone: [phone] Fax:  [phone]  Date:  10/10/2015   ID:  Levi [name], DOB 1946/04/17, MRN [id number]  PCP:  Orpah Melter, MD   History of Present Illness: Levi [name] is a 70 y.o. male paroxysmal atrial fibrillation in December of 2013 here for followup.  PAF-low-dose Cardizem is being utilized. Echocardiogram demonstrated EF in the 40-50% range. Prior catheterization in 2010 showed no flow-limiting CAD.  Baseline COPD followed in pulmonary clinic.   CHADSVAS - 1 (age).  Occasional swelling in his ankles. When exercising, he may rarely feel a single pounding heartbeat. No shortness of breath, no syncope. He is had issues with his rods in his back. Long-term prednisone has made his bones frail.  Rare isolated pounding, PVC-like sensation. Occasional twinge in chest. Wife ran [employer].   Overall doing well. No further swelling.  He was seen in the atrial fibrillation clinic on 12/14/14. In review of their note, he was diagnosed back in December 2013 with low atrial fibrillation burden and on diltiazem 180 a day. He woke up in atrial fibrillation and was found to have a heart rate of 110 and felt dizzy. Went to the emergency room in [place], Ativan was given and when he was exercising he returned to sinus rhythm.his episode lasted for about 10 hours. Cardizem 30 mg was given for breakthrough episodes. He does not want to move onto antiarrhythmic therapy. Continue with low-dose aspirin because he has a score of 1.  On 07/31/15 he underwent right hip arthroplasty. On 10/09/15 a Z-Pak and prednisone was offered to him secondary to increased shortness of breath and cough at night for over 10 days with green mucus, no fevers. He is working out at [employer]. Occasionally will feel pinpoint discomfort right and left chest wall, along medial segment of bicep. He also met Fidela Salisbury from [employer] in  retirement.   Wt Readings from Last 3 Encounters:  10/10/15 162 lb 9.6 oz (73.755 kg)  07/31/15 157 lb (71.215 kg)  07/25/15 157 lb 14.4 oz (71.623 kg)     Past Medical History  Diagnosis Date  . Asthma   . ALLERGIC RHINITIS   . Hyperlipidemia     takes Lipitor daily; pt. states he takes a preventive  . Agent orange exposure 1970's    takes Imdur,Diltiazem, and Atacand daily  . Joint pain   . Joint swelling   . Elevated uric acid in blood     takes Allopurinol daily  . Bruises easily     d/t prednisone daily  . GERD (gastroesophageal reflux disease)     takes Protonix daily  . Diverticulitis   . Enlarged prostate     but not on any meds  . History of pneumonia     a. 2010  . Chronic bronchitis ([place])     "used to get it q yr; last time ?2008" (04/28/2012)  . H/O hiatal hernia   . Degenerative disk disease     "qwhere" (04/28/2012)  . Personal history of colonic adenomas 02/09/2013  . PAF (paroxysmal atrial fibrillation) ([place])     a. Dx 04/2012, CHA2DS2VASc = 1 (age);  b. 04/2012 Echo: EF 40-50%, mild MR.  [name] Dysrhythmia     afib  . COPD (chronic obstructive pulmonary disease) ([facility])     agent orange exposure  . Shortness of breath dyspnea     Past  Surgical History  Procedure Laterality Date  . Cholecystectomy    . Total hip arthroplasty Left 2011    "left" (04/28/2012)  . Lateral / posterior combined fusion lumbar spine  2012  . Total knee arthroplasty  01/10/2012    Procedure: TOTAL KNEE ARTHROPLASTY;  Surgeon: [name], MD;  Location: [facility];  Service: Orthopedics;;  left total knee arthroplasty  . Cardiac catheterization  11/2008    [name] - 20% calcified non flow limiting left main, 50% EF apical hypokinesis  . Knee arthroscopy Bilateral   . Colonoscopy    . Esophagogastroduodenoscopy    . Tonsillectomy and adenoidectomy  1954  . Reverse shoulder arthroplasty  04/28/2012    Procedure: REVERSE SHOULDER ARTHROPLASTY;  Surgeon: [name], MD;   Location: [facility];  Service: Orthopedics;  Laterality: Left;  Left shouder reverse total shoulder arthroplasty  . Shoulder surgery    . Anterior cervical decomp/discectomy fusion N/A 07/21/2012    Procedure: ANTERIOR CERVICAL DECOMPRESSION/DISCECTOMY FUSION 2 LEVELS;  Surgeon: [name], MD;  Location: [facility];  Service: Neurosurgery;  Laterality: N/A;  C4-5 C5-6 Anterior cervical decompression/diskectomy/fusion  . Eye surgery Bilateral 2011    steroidal encapsulation" (04/28/2012)  . Total knee arthroplasty Right 06/17/2014    Procedure: TOTAL KNEE ARTHROPLASTY;  Surgeon: [name], MD;  Location: [facility];  Service: Orthopedics;  Laterality: Right;  . Back surgery      x 3  . Foot surgery Right     x 2  . Total hip arthroplasty Right 07/31/2015    Procedure: TOTAL HIP ARTHROPLASTY ANTERIOR APPROACH;  Surgeon: [name], MD;  Location: [facility];  Service: Orthopedics;  Laterality: Right;    Current Outpatient Prescriptions  Medication Sig Dispense Refill  . allopurinol (ZYLOPRIM) 100 MG tablet Take 100 mg by mouth daily.      [name] atorvastatin (LIPITOR) 10 MG tablet TAKE 1 TABLET (10 MG TOTAL) BY MOUTH DAILY. 30 tablet 8  . azithromycin (ZITHROMAX) 250 MG tablet Take 1 tablet (250 mg total) by mouth as directed. 6 tablet 0  . beclomethasone (QVAR) 80 MCG/ACT inhaler Inhale 2 puffs into the lungs 2 (two) times daily. 1 Inhaler 3  . candesartan (ATACAND) 4 MG tablet TAKE 0.5 TABLETS (2 MG TOTAL) BY MOUTH DAILY. 15 tablet 6  . cyclobenzaprine (FLEXERIL) 10 MG tablet Take 10 mg by mouth 3 (three) times daily as needed for muscle spasms.   10  . diltiazem (CARDIZEM CD) 180 MG 24 hr capsule TAKE 1 CAPSULE (180 MG TOTAL) BY MOUTH DAILY. 30 capsule 5  . diltiazem (CARDIZEM) 30 MG tablet Take 1 tablet every 4 hours AS NEEDED for afib HR >100 and BP >100 30 tablet 0  . isosorbide mononitrate (IMDUR) 30 MG 24 hr tablet TAKE 1 TABLET (30 MG TOTAL) BY MOUTH DAILY. 30 tablet 2  . pantoprazole (PROTONIX)  40 MG tablet TAKE 1 TABLET BY MOUTH TWICE A DAY 60 tablet 11  . predniSONE (DELTASONE) 10 MG tablet Take 1 tablet (10 mg total) by mouth daily. continuous 30 tablet 11  . PROVENTIL HFA 108 (90 BASE) MCG/ACT inhaler INHALE 2 PUFFS INTO THE LUNGS EVERY 4 (FOUR) HOURS AS NEEDED FOR WHEEZING. 6.7 each 3  . RAPAFLO 8 MG CAPS capsule Take 8 mg by mouth daily with breakfast.   11  . STRIVERDI RESPIMAT 2.5 MCG/ACT AERS INHALE 2 PUFFS INTO THE LUNGS DAILY. 1 Inhaler 6   No current facility-administered medications for this visit.  Allergies:    Allergies  Allergen Reactions  . Morphine And Related Palpitations    Light headed and sweaty; "my heart races" (04/28/2012), pt states too much morphine gave him this reaction  . Levaquin [Levofloxacin] Itching    Social History:  The patient  reports that he has never smoked. He has never used smokeless tobacco. He reports that he does not drink alcohol or use illicit drugs.   ROS:  Please see the history of present illness.      PHYSICAL EXAM: VS:  BP 128/72 mmHg  Pulse 78  Ht _0  (1.88 m)  Wt 162 lb 9.6 oz (73.755 kg)  BMI 20.87 kg/m2 Well nourished, well developed, in no acute distress HEENT: normal Neck: no JVD Cardiac:  normal S1, S2; RRR; no murmur Lungs:  Somewhat distant breath sounds but overall adequate air movement, mildly barrel chested , no wheezing, rhonchi or rales Abd: soft, nontender, no hepatomegaly Ext: no edema Skin: warm and dry Neuro: no focal abnormalities noted  EKG:  EKG was ordered today. On 10/10/15-sinus rhythm, 78, right atrial enlargement personally viewed-no change from prior 03/28/14-sinus rhythm, 68, no other abnormalities, previousSinus bradycardia rate 55 with no other abnormalities. Normal intervals. No change from prior.    Labs: 10/19/13-LDL 73  ASSESSMENT AND PLAN:  1. Paroxysmal atrial fibrillation-no recent symptomatic episodes. Continue with diltiazem 180. He has diltiazem 30 mg for breakthrough. He  has not needed to take this. Certainly this can be contributing somewhat to his intermittent edema as a side effect. We will continue to monitor. No anticoagulation because of CHADSVAS 1.  Appreciate previous visit by atrial fibrillation clinic. Told him that sometimes with lung infections, prednisone, we can see evidence of atrial fibrillation. If he does feel rapid ventricular response, he knows to take an extra diltiazem. 2. COPD-Dr. Annamaria Boots. Z-Pak, prednisone administered 10/09/15. No active wheezing heard on exam today. He picked at medications. 3. Occasional palpitations-described as a "pounding" single beat. This could be a PVC or PAC. No high-risk symptoms with this such as syncope, racing, shortness of breath. We will continue to monitor. It is been quite sometime since he is had another episode. 4. I will see him back in 6 months. 5. Continue with lipid modification. No changes made with statin. Low-dose.  Signed, Candee Furbish, MD [facility]  10/10/2015 9:10 AM

## 2015-10-10 NOTE — Patient Instructions (Signed)
Medication Instructions:  Please decrease your ASA to 81 mg a day. Continue all other medications as listed.  Follow-Up: Follow up in 6 months with Dr. Skains.  You will receive a letter in the mail 2 months before you are due.  Please call us when you receive this letter to schedule your follow up appointment.  If you need a refill on your cardiac medications before your next appointment, please call your pharmacy.  Thank you for choosing [facility]!!       

## 2015-10-12 ENCOUNTER — Encounter: Payer: Self-pay | Admitting: Internal Medicine

## 2015-10-12 DIAGNOSIS — Z96641 Presence of right artificial hip joint: Secondary | ICD-10-CM | POA: Diagnosis not present

## 2015-10-12 DIAGNOSIS — M542 Cervicalgia: Secondary | ICD-10-CM | POA: Diagnosis not present

## 2015-10-16 ENCOUNTER — Ambulatory Visit (INDEPENDENT_AMBULATORY_CARE_PROVIDER_SITE_OTHER): Payer: Medicare Other | Admitting: *Deleted

## 2015-10-16 DIAGNOSIS — J309 Allergic rhinitis, unspecified: Secondary | ICD-10-CM | POA: Diagnosis not present

## 2015-10-26 ENCOUNTER — Ambulatory Visit (INDEPENDENT_AMBULATORY_CARE_PROVIDER_SITE_OTHER): Payer: Medicare Other | Admitting: Internal Medicine

## 2015-10-26 ENCOUNTER — Other Ambulatory Visit (INDEPENDENT_AMBULATORY_CARE_PROVIDER_SITE_OTHER): Payer: Medicare Other

## 2015-10-26 ENCOUNTER — Encounter: Payer: Self-pay | Admitting: Internal Medicine

## 2015-10-26 ENCOUNTER — Ambulatory Visit (INDEPENDENT_AMBULATORY_CARE_PROVIDER_SITE_OTHER): Payer: Medicare Other | Admitting: *Deleted

## 2015-10-26 VITALS — BP 112/60 | HR 61 | Ht 74.0 in | Wt 167.2 lb

## 2015-10-26 DIAGNOSIS — I482 Chronic atrial fibrillation, unspecified: Secondary | ICD-10-CM

## 2015-10-26 DIAGNOSIS — J309 Allergic rhinitis, unspecified: Secondary | ICD-10-CM | POA: Diagnosis not present

## 2015-10-26 DIAGNOSIS — J449 Chronic obstructive pulmonary disease, unspecified: Secondary | ICD-10-CM

## 2015-10-26 DIAGNOSIS — J45909 Unspecified asthma, uncomplicated: Secondary | ICD-10-CM

## 2015-10-26 DIAGNOSIS — J4540 Moderate persistent asthma, uncomplicated: Secondary | ICD-10-CM

## 2015-10-26 DIAGNOSIS — J301 Allergic rhinitis due to pollen: Secondary | ICD-10-CM

## 2015-10-26 LAB — CBC WITH DIFFERENTIAL/PLATELET
Basophils Absolute: 0 10*3/uL (ref 0.0–0.1)
Basophils Relative: 0.3 % (ref 0.0–3.0)
Eosinophils Absolute: 0 10*3/uL (ref 0.0–0.7)
Eosinophils Relative: 0.5 % (ref 0.0–5.0)
HCT: 35.3 % — ABNORMAL LOW (ref 39.0–52.0)
Hemoglobin: 11.3 g/dL — ABNORMAL LOW (ref 13.0–17.0)
Lymphocytes Relative: 10.4 % — ABNORMAL LOW (ref 12.0–46.0)
Lymphs Abs: 0.9 10*3/uL (ref 0.7–4.0)
MCHC: 32.1 g/dL (ref 30.0–36.0)
MCV: 83.6 fl (ref 78.0–100.0)
Monocytes Absolute: 0.5 10*3/uL (ref 0.1–1.0)
Monocytes Relative: 5.3 % (ref 3.0–12.0)
Neutro Abs: 7.4 10*3/uL (ref 1.4–7.7)
Neutrophils Relative %: 83.5 % — ABNORMAL HIGH (ref 43.0–77.0)
Platelets: 239 10*3/uL (ref 150.0–400.0)
RBC: 4.22 Mil/uL (ref 4.22–5.81)
RDW: 17.3 % — ABNORMAL HIGH (ref 11.5–15.5)
WBC: 8.9 10*3/uL (ref 4.0–10.5)

## 2015-10-26 MED ORDER — GLYCOPYRROLATE-FORMOTEROL 9-4.8 MCG/ACT IN AERO: 2.0000 | INHALATION_SPRAY | Freq: Two times a day (BID) | RESPIRATORY_TRACT | Status: DC

## 2015-10-26 NOTE — Progress Notes (Signed)
Patient ID: Levi [name], male   DOB: 06/13/1945, 70 y.o.   MRN: [id number] Patient seen in the office today and instructed on use of Bevespi.  Patient expressed understanding and demonstrated technique.

## 2015-10-26 NOTE — Patient Instructions (Signed)
Sample Bevespi maintenance inhaler    Inhale 2 puffs, twice daily     Try this instead of Striverdi and Qvar. When the [place]enn[place] runs out, go back to [employer] and Qvar for comparison  Order- lab- Alergy profile, CBC w diff   Dx asthma moderate persistent

## 2015-10-26 NOTE — Progress Notes (Signed)
Subjective:    Patient ID: Levi [name], male    DOB: [birth year], 70 y.o.   MRN: [id number]  12/24/10- 62 yoM never smoker followed for allergy/ asthma, rhinitis by Dr Annamaria Boots, for asthma/ hx ABPA by Dr Gwenette Greet Last here June 25, 2010 Last visit he had a rash on his hand. That resolved with topical steroid, but has recently recurred in the same area on dorsum right hand. Allergy vaccine- 1:10 [facility] Had more asthma at end of April- saw Dr Gwenette Greet. Maintenance prednisone was briefly increased, now back to 5 mg daily. Has needed rescue inhaler only 2-3x since then. Occasional stuffy nose- uses saline nasal rinse.  CXR Feb, 2012- clear, NAD.   06/26/11- 64 yoM never smoker followed for allergy/ asthma, rhinitis by Dr Annamaria Boots, for asthma/ hx ABPA by Dr Gwenette Greet He was itching and sneezing a few days ago but that resolved. He had a temperature up to 100.3 with sweating at night, light green sputum, raspy crackle in his chest. He felt that he had a cold starting to get better. Denies sore throat sinus pain or chest pain. Continues allergy vaccine without problems and is satisfied that it is helpful. Continues prednisone maintenance 5 mg daily. Has not had significant exacerbation of wheezing with his recent acute illness.  12/27/11- 64 yoM never smoker followed for allergy/ asthma, rhinitis by Dr Annamaria Boots, for asthma/ hx ABPA by Dr Gwenette Greet Doing well on vaccine and denies any trouble/flare ups at this time Continue vaccine 1:10 [facility]. Spring was difficult season, with some tightness, but doing well now.  Always some postnasal drip and nose "floods" at times. Rare headache.  12/28/12- 64 yoM never smoker followed for allergy/ asthma, rhinitis by Dr Annamaria Boots, for asthma/ hx ABPA by Dr Gwenette Greet FOLLOWS FOR: still on vaccine 1:10 [place] and doing well. Says it helps. Maintenance prednisone 10 mg daily. In early summer needed more rescue inhaler blamed on air quality. Doing particularly well now. Doesn't remember experience  with Dymista or Flonase. Uses occasional Zyrtec. Has a chronic septal perforation so we agreed not to use any nasal spray now.  12/24/13- 67 yoM never smoker followed for allergy/ asthma with COPD, rhinitis by Dr Annamaria Boots, for asthma/ hx ABPA by Dr Gwenette Greet FOLLOWS FOR: still on allergy vaccine 1:10 [facility] and doing well Has seen Dr Gwenette Greet for exacerbations requiring prednisone burst.  He says he is generally "better off" on allergy shots Does worst in spring season and with current humidity. Not needing nasal spray. Likes saline rinse by squirt bottle.HPI  10/03/14- Dr Gwenette Greet The patient comes in today for an acute sick visit. He has known chronic obstructive asthma and likely ABPA.  He has been on long-standing prednisone at a very low dose, and is also on allergy vaccine. He has done well on this regimen overall. He comes in today with increasing shortness of breath that started last week, but is actually improved over the weekend. He denies any chest congestion, but is having a lot of postnasal drip with hoarseness. He does cough up brown looking mucus in small quantities, and thinks it is coming from his throat. He is not having sinus pressure or significant congestion. It should also be noted that he has not been taking his antihistamine on a regular basis.   12/26/14- 68 yoM never smoker followed for allergy/ asthma, rhinitis by Dr Annamaria Boots, for asthma/ hx ABPA by Dr Gwenette Greet, complicated by PAFib FOLLOWS FOR: FOLLOWS FOR: Still on vaccine 1:10 [facility] and no trouble  with allergies. Pt states he was recently in hospital for afib(corrected) and continues to have trouble with breathing. Insurance would not cover [place]. Feels bilateral maxillary and frontal pressure with little discharge. Cough productive clear mucus. Using rescue inhaler once or twice a week, continues prednisone 10 mg daily maintenance. Insurance change [employer] to [employer]. CTa chest 11/15/14 IMPRESSION: No evidence for pulmonary  embolism. Electronically Signed  By: Lovey Newcomer M.D.  On: 11/15/2014 19:13   112/1/16- 70 year old male never smoker followed for allergy/asthma, rhinitis, history  ABPA, complicated by PAfib FOLLOW FOR: Asthma, Allergies; hard to breath lately x last 6 weeks.  Allergy Vaccine 1:10 [facility] PFT- 01/04/2015-minimal restriction, minimal diffusion defect, insignificant response to bronchodilator. FVC 4.55/86%, FEV1 3.28/83%, FEV1/FVC 0.72, TLC 75%, DLCO 77%. Some increased shortness of breath in the last 6 weeks with mild wheeze, little cough.  10/26/2015-70 year old male never smoker followed for allergy/asthma, rhinitis, history ABPA, complicated by PAfib Allergy Vaccine 1:10 [facility] FOLLOWS FOR: Pt still on vaccine and denies any issues with allergies at this time. Tolerated hip replacement surgery in March. Some increased cough and shortness of breath. He doesn't think Qvar helps and he continues maintenance prednisone 10 mg daily. We discussed medication options. Note mild anemia at last check. CXR 07/25/2015-NAD with mild hyperinflation, prior orthopedic surgery changes.  ROS-see HPI   Negative unless "+" Constitutional:    weight loss, night sweats, fevers, chills, fatigue, lassitude. HEENT:    headaches, difficulty swallowing, tooth/dental problems, sore throat,       sneezing, itching, ear ache, nasal congestion, post nasal drip, snoring CV:    chest pain, orthopnea, PND, swelling in lower extremities, anasarca,                                                    dizziness, palpitations Resp:   + shortness of breath with exertion or at rest.                productive cough,   non-productive cough, coughing up of blood.              change in color of mucus.  + wheezing.   Skin:    rash or lesions. GI:  No-   heartburn, indigestion, abdominal pain, nausea, vomiting, diarrhea,                 change in bowel habits, loss of appetite GU: dysuria, change in color of urine, no urgency or  frequency.   flank pain. MS:   joint pain, stiffness, decreased range of motion, back pain. Neuro-     nothing unusual Psych:  change in mood or affect.  depression or anxiety.   memory loss.    Objective:  OBJ- Physical Exam General- Alert, Oriented, Affect-appropriate, Distress- none acute, tall/thin Skin- rash-none, lesions- none, excoriation- none Lymphadenopathy- none Head- atraumatic            Eyes- Gross vision intact, PERRLA, conjunctivae and secretions clear            Ears- Hearing, canals-normal            Nose- Clear, no-Septal dev, mucus, polyps, erosion, perforation             Throat- Mallampati II , mucosa clear , drainage- none, tonsils- atrophic Neck- flexible , trachea midline, no stridor , thyroid nl,  carotid no bruit Chest - symmetrical excursion , unlabored           Heart/CV- RRR , no murmur , no gallop  , no rub, nl s1 s2                           - JVD- none , edema- none, stasis changes- none, varices- none           Lung- clear to P&A, wheeze- none, cough- none , dullness-none, rub- none           Chest wall-  Abd-  Br/ Gen/ Rectal- Not done, not indicated Extrem- cyanosis- none, clubbing, none, atrophy- none, strength- nl Neuro- grossly intact to observation    Assessment & Plan:

## 2015-10-27 LAB — RESPIRATORY ALLERGY PROFILE REGION II ~~LOC~~
Allergen, Cedar tree, t12: <0.1 kU/L
Allergen, Comm Silver Birch, t9: <0.1 kU/L
Allergen, Cottonwood, t14: <0.1 kU/L
Allergen, D pternoyssinus,d7: 0.62 kU/L — ABNORMAL HIGH
Allergen, Mouse Urine Protein, e78: <0.1 kU/L
Allergen, Mulberry, t76: <0.1 kU/L
Allergen, Oak,t7: <0.1 kU/L
Alternaria Alternata: <0.1 kU/L
Aspergillus fumigatus, m3: 0.35 kU/L — ABNORMAL HIGH
Bermuda Grass: <0.1 kU/L
Box Elder IgE: <0.1 kU/L
Cat Dander: <0.1 kU/L
Cladosporium Herbarum: <0.1 kU/L
Cockroach: <0.1 kU/L
Common Ragweed: <0.1 kU/L
D. farinae: 1.8 kU/L — ABNORMAL HIGH
Dog Dander: <0.1 kU/L
Elm IgE: <0.1 kU/L
IgE (Immunoglobulin E), Serum: 60 kU/L (ref <115)
Johnson Grass: <0.1 kU/L
Pecan/Hickory Tree IgE: <0.1 kU/L
Penicillium Notatum: <0.1 kU/L
Rough Pigweed  IgE: <0.1 kU/L
Sheep Sorrel IgE: <0.1 kU/L
Timothy Grass: <0.1 kU/L

## 2015-10-29 NOTE — Assessment & Plan Note (Signed)
He may not have much reversibility but we are going to try switch from Qvar/Stiverdi to [place].

## 2015-10-29 NOTE — Assessment & Plan Note (Signed)
Controlled ventricular response rate 

## 2015-10-29 NOTE — Assessment & Plan Note (Signed)
Plan-reassess current atopic role-lab for CBC with differential and Allergy Profile

## 2015-10-31 ENCOUNTER — Ambulatory Visit (INDEPENDENT_AMBULATORY_CARE_PROVIDER_SITE_OTHER): Payer: Medicare Other | Admitting: *Deleted

## 2015-10-31 DIAGNOSIS — J309 Allergic rhinitis, unspecified: Secondary | ICD-10-CM

## 2015-11-03 ENCOUNTER — Other Ambulatory Visit: Payer: Self-pay | Admitting: Internal Medicine

## 2015-11-06 ENCOUNTER — Other Ambulatory Visit: Payer: Self-pay | Admitting: Internal Medicine

## 2015-11-07 ENCOUNTER — Ambulatory Visit (INDEPENDENT_AMBULATORY_CARE_PROVIDER_SITE_OTHER): Payer: Medicare Other | Admitting: *Deleted

## 2015-11-07 DIAGNOSIS — J309 Allergic rhinitis, unspecified: Secondary | ICD-10-CM

## 2015-11-10 NOTE — Progress Notes (Signed)
Quick Note:  LMTCB ______ 

## 2015-11-14 ENCOUNTER — Ambulatory Visit (INDEPENDENT_AMBULATORY_CARE_PROVIDER_SITE_OTHER): Payer: Medicare Other | Admitting: *Deleted

## 2015-11-14 ENCOUNTER — Other Ambulatory Visit: Payer: Self-pay | Admitting: Cardiology

## 2015-11-14 DIAGNOSIS — J309 Allergic rhinitis, unspecified: Secondary | ICD-10-CM

## 2015-11-20 ENCOUNTER — Ambulatory Visit (INDEPENDENT_AMBULATORY_CARE_PROVIDER_SITE_OTHER): Payer: Medicare Other | Admitting: *Deleted

## 2015-11-20 DIAGNOSIS — J309 Allergic rhinitis, unspecified: Secondary | ICD-10-CM | POA: Diagnosis not present

## 2015-11-20 DIAGNOSIS — L602 Onychogryphosis: Secondary | ICD-10-CM | POA: Diagnosis not present

## 2015-11-20 DIAGNOSIS — D2372 Other benign neoplasm of skin of left lower limb, including hip: Secondary | ICD-10-CM | POA: Diagnosis not present

## 2015-11-29 ENCOUNTER — Ambulatory Visit (INDEPENDENT_AMBULATORY_CARE_PROVIDER_SITE_OTHER): Payer: Medicare Other | Admitting: *Deleted

## 2015-11-29 DIAGNOSIS — J309 Allergic rhinitis, unspecified: Secondary | ICD-10-CM

## 2015-12-05 ENCOUNTER — Ambulatory Visit (INDEPENDENT_AMBULATORY_CARE_PROVIDER_SITE_OTHER): Payer: Medicare Other | Admitting: *Deleted

## 2015-12-05 DIAGNOSIS — J309 Allergic rhinitis, unspecified: Secondary | ICD-10-CM | POA: Diagnosis not present

## 2015-12-12 ENCOUNTER — Ambulatory Visit (INDEPENDENT_AMBULATORY_CARE_PROVIDER_SITE_OTHER): Payer: Medicare Other | Admitting: *Deleted

## 2015-12-12 DIAGNOSIS — J309 Allergic rhinitis, unspecified: Secondary | ICD-10-CM | POA: Diagnosis not present

## 2015-12-13 ENCOUNTER — Other Ambulatory Visit: Payer: Self-pay | Admitting: Cardiology

## 2015-12-13 MED ORDER — CANDESARTAN CILEXETIL 4 MG PO TABS: ORAL_TABLET | ORAL | Status: DC

## 2015-12-19 ENCOUNTER — Ambulatory Visit (INDEPENDENT_AMBULATORY_CARE_PROVIDER_SITE_OTHER): Payer: Medicare Other | Admitting: *Deleted

## 2015-12-19 DIAGNOSIS — J309 Allergic rhinitis, unspecified: Secondary | ICD-10-CM

## 2015-12-19 DIAGNOSIS — M25551 Pain in right hip: Secondary | ICD-10-CM | POA: Diagnosis not present

## 2015-12-20 ENCOUNTER — Telehealth: Payer: Self-pay | Admitting: Internal Medicine

## 2015-12-20 DIAGNOSIS — J309 Allergic rhinitis, unspecified: Secondary | ICD-10-CM | POA: Diagnosis not present

## 2015-12-20 NOTE — Telephone Encounter (Signed)
Allergy Serum Extract Date Mixed: 12/20/15 Vial: 1 Strength: 1:10 Here/Mail/Pick Up: here Mixed By: tbs Last OV: 10/26/15 Pending OV: 04/26/16

## 2015-12-29 ENCOUNTER — Ambulatory Visit (INDEPENDENT_AMBULATORY_CARE_PROVIDER_SITE_OTHER): Payer: Medicare Other | Admitting: *Deleted

## 2015-12-29 DIAGNOSIS — J309 Allergic rhinitis, unspecified: Secondary | ICD-10-CM | POA: Diagnosis not present

## 2015-12-29 NOTE — Progress Notes (Signed)
Created another encounter in error, disregard.

## 2016-01-08 ENCOUNTER — Ambulatory Visit (INDEPENDENT_AMBULATORY_CARE_PROVIDER_SITE_OTHER): Payer: Medicare Other | Admitting: *Deleted

## 2016-01-08 DIAGNOSIS — J309 Allergic rhinitis, unspecified: Secondary | ICD-10-CM | POA: Diagnosis not present

## 2016-01-08 DIAGNOSIS — D2372 Other benign neoplasm of skin of left lower limb, including hip: Secondary | ICD-10-CM | POA: Diagnosis not present

## 2016-01-10 ENCOUNTER — Telehealth: Payer: Self-pay | Admitting: Internal Medicine

## 2016-01-10 MED ORDER — PREDNISONE 10 MG PO TABS: ORAL_TABLET | ORAL | 0 refills | Status: DC

## 2016-01-10 MED ORDER — DOXYCYCLINE HYCLATE 100 MG PO TABS: ORAL_TABLET | ORAL | 0 refills | Status: DC

## 2016-01-10 NOTE — Telephone Encounter (Signed)
Pt returning call.Stanley A Dalton ° °

## 2016-01-10 NOTE — Telephone Encounter (Signed)
Offer prednisone 10 mg, # 20, 4 X 2 DAYS, 3 X 2 DAYS, 2 X 2 DAYS, 1 X 2 DAYS           Doxycycline 100 mg, # 8, 2 today then one daily

## 2016-01-10 NOTE — Telephone Encounter (Signed)
Called spoke with pt. C/o increase SOB w/ exertion, wheezing, prod cough (green phlem), fever of 100.0. Requesting something to be called in if possible. Please advise Dr. Annamaria Boots thanks  Allergies  Allergen Reactions  . Morphine And Related Palpitations    Light headed and sweaty; "my heart races" (04/28/2012), pt states too much morphine gave him this reaction  . Levaquin [Levofloxacin] Itching     Current Outpatient Prescriptions on File Prior to Visit  Medication Sig Dispense Refill  . allopurinol (ZYLOPRIM) 100 MG tablet Take 100 mg by mouth daily.      [name] aspirin EC 81 MG tablet Take 1 tablet (81 mg total) by mouth daily. 90 tablet 3  . atorvastatin (LIPITOR) 10 MG tablet TAKE 1 TABLET (10 MG TOTAL) BY MOUTH DAILY. 30 tablet 8  . candesartan (ATACAND) 4 MG tablet TAKE 0.5 TABLETS (2 MG TOTAL) BY MOUTH DAILY. 15 tablet 10  . diltiazem (CARDIZEM CD) 180 MG 24 hr capsule TAKE 1 CAPSULE (180 MG TOTAL) BY MOUTH DAILY. 30 capsule 5  . diltiazem (CARDIZEM) 30 MG tablet Take 1 tablet every 4 hours AS NEEDED for afib HR >100 and BP >100 30 tablet 0  . Glycopyrrolate-Formoterol (BEVESPI AEROSPHERE) 9-4.8 MCG/ACT AERO Inhale 2 puffs into the lungs 2 (two) times daily. 1 Inhaler 0  . isosorbide mononitrate (IMDUR) 30 MG 24 hr tablet TAKE 1 TABLET (30 MG TOTAL) BY MOUTH DAILY. 30 tablet 11  . pantoprazole (PROTONIX) 40 MG tablet TAKE 1 TABLET BY MOUTH TWICE A DAY 60 tablet 11  . predniSONE (DELTASONE) 10 MG tablet Take 1 tablet (10 mg total) by mouth daily. continuous 30 tablet 11  . PROVENTIL HFA 108 (90 BASE) MCG/ACT inhaler INHALE 2 PUFFS INTO THE LUNGS EVERY 4 (FOUR) HOURS AS NEEDED FOR WHEEZING. 6.7 each 3  . QVAR 80 MCG/ACT inhaler INHALE 2 PUFFS INTO THE LUNGS 2 (TWO) TIMES DAILY. 8.7 g 1  . STRIVERDI RESPIMAT 2.5 MCG/ACT AERS INHALE 2 PUFFS INTO THE LUNGS DAILY. 1 Inhaler 6  . tamsulosin (FLOMAX) 0.4 MG CAPS capsule Take 0.4 mg by mouth at bedtime.  11   No current facility-administered  medications on file prior to visit.

## 2016-01-10 NOTE — Telephone Encounter (Signed)
LMTCB x 1 

## 2016-01-10 NOTE — Telephone Encounter (Signed)
Patient notified of Dr. Young's recommendations. Rx sent to pharmacy. Nothing further needed.  

## 2016-01-15 ENCOUNTER — Telehealth: Payer: Self-pay | Admitting: Cardiology

## 2016-01-15 ENCOUNTER — Ambulatory Visit (INDEPENDENT_AMBULATORY_CARE_PROVIDER_SITE_OTHER): Payer: Medicare Other | Admitting: *Deleted

## 2016-01-15 DIAGNOSIS — M25551 Pain in right hip: Secondary | ICD-10-CM | POA: Diagnosis not present

## 2016-01-15 DIAGNOSIS — M79641 Pain in right hand: Secondary | ICD-10-CM | POA: Diagnosis not present

## 2016-01-15 DIAGNOSIS — J309 Allergic rhinitis, unspecified: Secondary | ICD-10-CM | POA: Diagnosis not present

## 2016-01-15 NOTE — Telephone Encounter (Signed)
New Message  Pts wife voiced pt usually have blood work done after the appt and pt and wife thinks it defeats the purpose of having an appt. Prior too.  pts wife voiced she'd like a call back to see if the pt is suppose to have labs or not the same day as appt.  Please follow up with pt. Thanks!

## 2016-01-15 NOTE — Telephone Encounter (Signed)
Spoke with wife who is aware if lab work is needed Dr Marlou Porch will order it at the time of his appt.  Aware he may come fasting for L/L if ordered.  She thanked me for my time and information.

## 2016-01-16 ENCOUNTER — Ambulatory Visit: Payer: Medicare Other

## 2016-01-17 ENCOUNTER — Other Ambulatory Visit: Payer: Self-pay | Admitting: Internal Medicine

## 2016-01-22 ENCOUNTER — Ambulatory Visit (INDEPENDENT_AMBULATORY_CARE_PROVIDER_SITE_OTHER): Payer: Medicare Other

## 2016-01-22 ENCOUNTER — Ambulatory Visit (INDEPENDENT_AMBULATORY_CARE_PROVIDER_SITE_OTHER): Payer: Medicare Other | Admitting: *Deleted

## 2016-01-22 DIAGNOSIS — Z23 Encounter for immunization: Secondary | ICD-10-CM | POA: Diagnosis not present

## 2016-01-22 DIAGNOSIS — J309 Allergic rhinitis, unspecified: Secondary | ICD-10-CM | POA: Diagnosis not present

## 2016-01-30 ENCOUNTER — Ambulatory Visit (INDEPENDENT_AMBULATORY_CARE_PROVIDER_SITE_OTHER): Payer: Medicare Other | Admitting: *Deleted

## 2016-01-30 DIAGNOSIS — J309 Allergic rhinitis, unspecified: Secondary | ICD-10-CM | POA: Diagnosis not present

## 2016-02-06 ENCOUNTER — Ambulatory Visit (INDEPENDENT_AMBULATORY_CARE_PROVIDER_SITE_OTHER): Payer: Medicare Other | Admitting: *Deleted

## 2016-02-06 ENCOUNTER — Other Ambulatory Visit: Payer: Self-pay | Admitting: Cardiology

## 2016-02-06 DIAGNOSIS — J309 Allergic rhinitis, unspecified: Secondary | ICD-10-CM | POA: Diagnosis not present

## 2016-02-12 ENCOUNTER — Ambulatory Visit (INDEPENDENT_AMBULATORY_CARE_PROVIDER_SITE_OTHER): Payer: Medicare Other

## 2016-02-12 DIAGNOSIS — J309 Allergic rhinitis, unspecified: Secondary | ICD-10-CM | POA: Diagnosis not present

## 2016-02-12 DIAGNOSIS — Z125 Encounter for screening for malignant neoplasm of prostate: Secondary | ICD-10-CM | POA: Diagnosis not present

## 2016-02-13 DIAGNOSIS — M25551 Pain in right hip: Secondary | ICD-10-CM | POA: Diagnosis not present

## 2016-02-13 DIAGNOSIS — M25562 Pain in left knee: Secondary | ICD-10-CM | POA: Diagnosis not present

## 2016-02-20 ENCOUNTER — Ambulatory Visit (INDEPENDENT_AMBULATORY_CARE_PROVIDER_SITE_OTHER): Payer: Medicare Other | Admitting: *Deleted

## 2016-02-20 DIAGNOSIS — J309 Allergic rhinitis, unspecified: Secondary | ICD-10-CM | POA: Diagnosis not present

## 2016-02-22 ENCOUNTER — Other Ambulatory Visit: Payer: Self-pay | Admitting: Internal Medicine

## 2016-02-22 DIAGNOSIS — M7732 Calcaneal spur, left foot: Secondary | ICD-10-CM | POA: Diagnosis not present

## 2016-02-22 DIAGNOSIS — M7731 Calcaneal spur, right foot: Secondary | ICD-10-CM | POA: Diagnosis not present

## 2016-02-22 DIAGNOSIS — M722 Plantar fascial fibromatosis: Secondary | ICD-10-CM | POA: Diagnosis not present

## 2016-02-22 DIAGNOSIS — M71571 Other bursitis, not elsewhere classified, right ankle and foot: Secondary | ICD-10-CM | POA: Diagnosis not present

## 2016-02-22 DIAGNOSIS — M71572 Other bursitis, not elsewhere classified, left ankle and foot: Secondary | ICD-10-CM | POA: Diagnosis not present

## 2016-02-28 DIAGNOSIS — S92354S Nondisplaced fracture of fifth metatarsal bone, right foot, sequela: Secondary | ICD-10-CM | POA: Diagnosis not present

## 2016-02-28 DIAGNOSIS — D2372 Other benign neoplasm of skin of left lower limb, including hip: Secondary | ICD-10-CM | POA: Diagnosis not present

## 2016-02-29 DIAGNOSIS — M722 Plantar fascial fibromatosis: Secondary | ICD-10-CM | POA: Diagnosis not present

## 2016-02-29 DIAGNOSIS — M71571 Other bursitis, not elsewhere classified, right ankle and foot: Secondary | ICD-10-CM | POA: Diagnosis not present

## 2016-03-01 ENCOUNTER — Ambulatory Visit (INDEPENDENT_AMBULATORY_CARE_PROVIDER_SITE_OTHER): Payer: Medicare Other | Admitting: *Deleted

## 2016-03-01 DIAGNOSIS — J309 Allergic rhinitis, unspecified: Secondary | ICD-10-CM

## 2016-03-05 ENCOUNTER — Ambulatory Visit (INDEPENDENT_AMBULATORY_CARE_PROVIDER_SITE_OTHER): Payer: Medicare Other | Admitting: *Deleted

## 2016-03-05 DIAGNOSIS — J309 Allergic rhinitis, unspecified: Secondary | ICD-10-CM

## 2016-03-11 DIAGNOSIS — M7751 Other enthesopathy of right foot: Secondary | ICD-10-CM | POA: Diagnosis not present

## 2016-03-11 DIAGNOSIS — M722 Plantar fascial fibromatosis: Secondary | ICD-10-CM | POA: Diagnosis not present

## 2016-03-11 DIAGNOSIS — M71571 Other bursitis, not elsewhere classified, right ankle and foot: Secondary | ICD-10-CM | POA: Diagnosis not present

## 2016-03-12 ENCOUNTER — Ambulatory Visit (INDEPENDENT_AMBULATORY_CARE_PROVIDER_SITE_OTHER): Payer: Medicare Other | Admitting: *Deleted

## 2016-03-12 DIAGNOSIS — J309 Allergic rhinitis, unspecified: Secondary | ICD-10-CM

## 2016-03-18 ENCOUNTER — Other Ambulatory Visit: Payer: Self-pay | Admitting: Internal Medicine

## 2016-03-19 ENCOUNTER — Ambulatory Visit (INDEPENDENT_AMBULATORY_CARE_PROVIDER_SITE_OTHER): Payer: Medicare Other | Admitting: *Deleted

## 2016-03-19 DIAGNOSIS — J309 Allergic rhinitis, unspecified: Secondary | ICD-10-CM | POA: Diagnosis not present

## 2016-03-25 DIAGNOSIS — M71571 Other bursitis, not elsewhere classified, right ankle and foot: Secondary | ICD-10-CM | POA: Diagnosis not present

## 2016-03-25 DIAGNOSIS — M7751 Other enthesopathy of right foot: Secondary | ICD-10-CM | POA: Diagnosis not present

## 2016-03-26 ENCOUNTER — Ambulatory Visit (INDEPENDENT_AMBULATORY_CARE_PROVIDER_SITE_OTHER): Payer: Medicare Other | Admitting: *Deleted

## 2016-03-26 DIAGNOSIS — J309 Allergic rhinitis, unspecified: Secondary | ICD-10-CM

## 2016-03-28 DIAGNOSIS — M7712 Lateral epicondylitis, left elbow: Secondary | ICD-10-CM | POA: Diagnosis not present

## 2016-03-28 DIAGNOSIS — M79671 Pain in right foot: Secondary | ICD-10-CM | POA: Diagnosis not present

## 2016-03-28 DIAGNOSIS — M722 Plantar fascial fibromatosis: Secondary | ICD-10-CM | POA: Diagnosis not present

## 2016-04-02 ENCOUNTER — Ambulatory Visit (INDEPENDENT_AMBULATORY_CARE_PROVIDER_SITE_OTHER): Payer: Medicare Other | Admitting: *Deleted

## 2016-04-02 DIAGNOSIS — J309 Allergic rhinitis, unspecified: Secondary | ICD-10-CM | POA: Diagnosis not present

## 2016-04-03 DIAGNOSIS — M25774 Osteophyte, right foot: Secondary | ICD-10-CM | POA: Diagnosis not present

## 2016-04-03 DIAGNOSIS — M722 Plantar fascial fibromatosis: Secondary | ICD-10-CM | POA: Diagnosis not present

## 2016-04-03 DIAGNOSIS — M7671 Peroneal tendinitis, right leg: Secondary | ICD-10-CM | POA: Diagnosis not present

## 2016-04-03 DIAGNOSIS — M71571 Other bursitis, not elsewhere classified, right ankle and foot: Secondary | ICD-10-CM | POA: Diagnosis not present

## 2016-04-09 ENCOUNTER — Encounter: Payer: Self-pay | Admitting: Cardiology

## 2016-04-09 ENCOUNTER — Ambulatory Visit (INDEPENDENT_AMBULATORY_CARE_PROVIDER_SITE_OTHER): Payer: Medicare Other | Admitting: Cardiology

## 2016-04-09 ENCOUNTER — Encounter (INDEPENDENT_AMBULATORY_CARE_PROVIDER_SITE_OTHER): Payer: Self-pay

## 2016-04-09 ENCOUNTER — Ambulatory Visit (INDEPENDENT_AMBULATORY_CARE_PROVIDER_SITE_OTHER): Payer: Medicare Other | Admitting: *Deleted

## 2016-04-09 VITALS — BP 110/76 | HR 76 | Ht 74.0 in | Wt 162.0 lb

## 2016-04-09 DIAGNOSIS — I480 Paroxysmal atrial fibrillation: Secondary | ICD-10-CM

## 2016-04-09 DIAGNOSIS — J309 Allergic rhinitis, unspecified: Secondary | ICD-10-CM

## 2016-04-09 DIAGNOSIS — E7800 Pure hypercholesterolemia, unspecified: Secondary | ICD-10-CM

## 2016-04-09 DIAGNOSIS — J449 Chronic obstructive pulmonary disease, unspecified: Secondary | ICD-10-CM | POA: Diagnosis not present

## 2016-04-09 MED ORDER — ATORVASTATIN CALCIUM 10 MG PO TABS: 10.0000 mg | ORAL_TABLET | Freq: Every day | ORAL | 3 refills | Status: DC

## 2016-04-09 NOTE — Progress Notes (Signed)
[facility]. [address] [place],   [address] Phone: [phone] Fax:  [phone]  Date:  04/09/2016   ID:  Levi [name], DOB February 28, 1946, MRN [id number]  PCP:  Orpah Melter, MD   History of Present Illness: Levi [name] is a 70 y.o. male paroxysmal atrial fibrillation in December of 2013 here for followup.  PAF-low-dose Cardizem is being utilized. Echocardiogram demonstrated EF in the 40-50% range. Prior catheterization in 2010 showed no flow-limiting CAD.  Baseline COPD followed in pulmonary clinic.   CHADSVAS - 1 (age).  Occasional swelling in his ankles question Tiazac. When exercising, he may rarely feel a single pounding heartbeat. No shortness of breath, no syncope. He is had issues with his rods in his back. Long-term prednisone has made his bones frail.  Rare isolated pounding, PVC-like sensation. Occasional twinge in chest. Wife ran [employer].   Overall doing well. No further swelling.  He was seen in the atrial fibrillation clinic on 12/14/14. In review of their note, he was diagnosed back in December 2013 with low atrial fibrillation burden and on diltiazem 180 a day. He woke up in atrial fibrillation and was found to have a heart rate of 110 and felt dizzy. Went to the emergency room in [place], Ativan was given and when he was exercising he returned to sinus rhythm.his episode lasted for about 10 hours. Cardizem 30 mg was given for breakthrough episodes. He does not want to move onto antiarrhythmic therapy. Continue with low-dose aspirin because he has a score of 1.  On 07/31/15 he underwent right hip arthroplasty. He is working out at [employer]. Occasionally will feel pinpoint discomfort right and left chest wall, along medial segment of bicep.  Fell in garage, has bruise underneath right eye. He thinks if he leans too far forward he sometimes stumbles. He wonders if this is from his prior back surgeries. He is not dropping anything  else. No other neurologic findings. No tremor.  Wt Readings from Last 3 Encounters:  04/09/16 162 lb (73.5 kg)  10/26/15 167 lb 3.2 oz (75.8 kg)  10/10/15 162 lb 9.6 oz (73.8 kg)     Past Medical History:  Diagnosis Date  . Agent orange exposure 1970's   takes Imdur,Diltiazem, and Atacand daily  . ALLERGIC RHINITIS   . Asthma   . Bruises easily    d/t prednisone daily  . Chronic bronchitis ([facility])    "used to get it q yr; last time ?2008" (04/28/2012)  . COPD (chronic obstructive pulmonary disease) ([facility])    agent orange exposure  . Degenerative disk disease    "qwhere" (04/28/2012)  . Diverticulitis   . Dysrhythmia    afib  . Elevated uric acid in blood    takes Allopurinol daily  . Enlarged prostate    but not on any meds  . GERD (gastroesophageal reflux disease)    takes Protonix daily  . H/O hiatal hernia   . History of pneumonia    a. 2010  . Hyperlipidemia    takes Lipitor daily; pt. states he takes a preventive  . Joint pain   . Joint swelling   . PAF (paroxysmal atrial fibrillation) ([facility])    a. Dx 04/2012, CHA2DS2VASc = 1 (age);  b. 04/2012 Echo: EF 40-50%, mild MR.  . Personal history of colonic adenomas 02/09/2013  . Shortness of breath dyspnea     Past Surgical History:  Procedure Laterality Date  .  ANTERIOR CERVICAL DECOMP/DISCECTOMY FUSION N/A 07/21/2012   Procedure: ANTERIOR CERVICAL DECOMPRESSION/DISCECTOMY FUSION 2 LEVELS;  Surgeon: [name], MD;  Location: [facility];  Service: Neurosurgery;  Laterality: N/A;  C4-5 C5-6 Anterior cervical decompression/diskectomy/fusion  . BACK SURGERY     x 3  . CARDIAC CATHETERIZATION  11/2008   [name] - 20% calcified non flow limiting left main, 50% EF apical hypokinesis  . CHOLECYSTECTOMY    . COLONOSCOPY    . ESOPHAGOGASTRODUODENOSCOPY    . EYE SURGERY Bilateral 2011   steroidal encapsulation" (04/28/2012)  . FOOT SURGERY Right    x 2  . KNEE ARTHROSCOPY Bilateral   . LATERAL / POSTERIOR COMBINED  FUSION LUMBAR SPINE  2012  . REVERSE SHOULDER ARTHROPLASTY  04/28/2012   Procedure: REVERSE SHOULDER ARTHROPLASTY;  Surgeon: [name], MD;  Location: [facility];  Service: Orthopedics;  Laterality: Left;  Left shouder reverse total shoulder arthroplasty  . SHOULDER SURGERY    . [place]  . TOTAL HIP ARTHROPLASTY Left 2011   "left" (04/28/2012)  . TOTAL HIP ARTHROPLASTY Right 07/31/2015   Procedure: TOTAL HIP ARTHROPLASTY ANTERIOR APPROACH;  Surgeon: [name], MD;  Location: [facility];  Service: Orthopedics;  Laterality: Right;  . TOTAL KNEE ARTHROPLASTY  01/10/2012   Procedure: TOTAL KNEE ARTHROPLASTY;  Surgeon: [name], MD;  Location: [facility];  Service: Orthopedics;;  left total knee arthroplasty  . TOTAL KNEE ARTHROPLASTY Right 06/17/2014   Procedure: TOTAL KNEE ARTHROPLASTY;  Surgeon: [name], MD;  Location: [facility];  Service: Orthopedics;  Laterality: Right;    Current Outpatient Prescriptions  Medication Sig Dispense Refill  . allopurinol (ZYLOPRIM) 100 MG tablet Take 100 mg by mouth daily.      [name] aspirin EC 81 MG tablet Take 1 tablet (81 mg total) by mouth daily. 90 tablet 3  . atorvastatin (LIPITOR) 10 MG tablet Take 1 tablet (10 mg total) by mouth daily. 90 tablet 3  . candesartan (ATACAND) 4 MG tablet TAKE 0.5 TABLETS (2 MG TOTAL) BY MOUTH DAILY. 15 tablet 10  . diltiazem (CARDIZEM CD) 180 MG 24 hr capsule TAKE 1 CAPSULE (180 MG TOTAL) BY MOUTH DAILY. 30 capsule 7  . diltiazem (CARDIZEM) 30 MG tablet Take 1 tablet every 4 hours AS NEEDED for afib HR >100 and BP >100 30 tablet 0  . doxycycline (VIBRA-TABS) 100 MG tablet Take 2 tablets today, then once daily until done. 8 tablet 0  . gabapentin (NEURONTIN) 300 MG capsule Take 1 capsule by mouth daily as needed for pain.  3  . Glycopyrrolate-Formoterol (BEVESPI AEROSPHERE) 9-4.8 MCG/ACT AERO Inhale 2 puffs into the lungs 2 (two) times daily. 1 Inhaler 0  . isosorbide mononitrate (IMDUR) 30 MG 24  hr tablet TAKE 1 TABLET (30 MG TOTAL) BY MOUTH DAILY. 30 tablet 11  . pantoprazole (PROTONIX) 40 MG tablet TAKE 1 TABLET BY MOUTH TWICE A DAY 60 tablet 11  . predniSONE (DELTASONE) 10 MG tablet 4 X 2 DAYS, 3 X 2 DAYS, 2 X 2 DAYS, 1 X 2 DAYS 20 tablet 0  . predniSONE (DELTASONE) 10 MG tablet TAKE 1 TABLET (10 MG TOTAL) BY MOUTH DAILY. CONTINUOUS 30 tablet 11  . PROVENTIL HFA 108 (90 BASE) MCG/ACT inhaler INHALE 2 PUFFS INTO THE LUNGS EVERY 4 (FOUR) HOURS AS NEEDED FOR WHEEZING. 6.7 each 3  . QVAR 80 MCG/ACT inhaler INHALE 2 PUFFS INTO THE LUNGS 2 (TWO) TIMES DAILY. 8.7 g 1  . STRIVERDI RESPIMAT  2.5 MCG/ACT AERS INHALE 2 PUFFS INTO THE LUNGS DAILY. 1 Inhaler 6  . tamsulosin (FLOMAX) 0.4 MG CAPS capsule Take 0.4 mg by mouth at bedtime.  11   No current facility-administered medications for this visit.     Allergies:    Allergies  Allergen Reactions  . Morphine And Related Palpitations    Light headed and sweaty; "my heart races" (04/28/2012), pt states too much morphine gave him this reaction  . Levaquin [Levofloxacin] Itching    Social History:  The patient  reports that he has never smoked. He has never used smokeless tobacco. He reports that he does not drink alcohol or use drugs.   ROS:  Please see the history of present illness.      PHYSICAL EXAM: VS:  BP 110/76   Pulse 76   Ht 6\' 2"  (1.88 m)   Wt 162 lb (73.5 kg)   SpO2 96%   BMI 20.80 kg/m  Well nourished, well developed, in no acute distress  HEENT: normal  Neck: no JVD  Cardiac:  normal S1, S2; RRR; no murmur  Lungs:  mildly barrel chested , no wheezing, rhonchi or rales  Abd: soft, nontender, no hepatomegaly  Ext: no edema  Skin: warm and dry  Neuro: no focal abnormalities noted  EKG:  EKG was ordered today. On 10/10/15-sinus rhythm, 78, right atrial enlargement personally viewed-no change from prior 03/28/14-sinus rhythm, 68, no other abnormalities, previousSinus bradycardia rate 55 with no other abnormalities. Normal  intervals. No change from prior.    Labs: 10/19/13-LDL 73  ASSESSMENT AND PLAN:  1. Paroxysmal atrial fibrillation-no recent symptomatic episodes. Continue with diltiazem 180. He has diltiazem 30 mg for breakthrough. He has not needed to take this. Certainly this can be contributing somewhat to his intermittent edema as a side effect. This seems to be pretty benign.  We will continue to monitor. No anticoagulation because of CHADSVAS 1.  Appreciate previous visit by atrial fibrillation clinic. If he does feel rapid ventricular response, he knows to take an extra diltiazem. 2. COPD-Dr. Annamaria Boots.  prednisone.  No active wheezing heard on exam today. Occasional palpitations-described as a "pounding" single beat. This could be a PVC or PAC. No high-risk symptoms with this such as syncope, racing, shortness of breath. We will continue to monitor. 3. I will see him back in 6 months. 4. Continue with lipid modification. No changes made with statin. Low-dose. Atorvastatin 10 mg.  Signed, Candee Furbish, MD [facility]  04/09/2016 8:12 AM

## 2016-04-09 NOTE — Patient Instructions (Signed)
Medication Instructions:  The current medical regimen is effective;  continue present plan and medications.  Follow-Up: Follow up in 6 months with Dr. Skains.  You will receive a letter in the mail 2 months before you are due.  Please call us when you receive this letter to schedule your follow up appointment.  If you need a refill on your cardiac medications before your next appointment, please call your pharmacy.  Thank you for choosing [facility]!!       

## 2016-04-11 DIAGNOSIS — M5412 Radiculopathy, cervical region: Secondary | ICD-10-CM | POA: Diagnosis not present

## 2016-04-12 ENCOUNTER — Other Ambulatory Visit: Payer: Self-pay | Admitting: Neurological Surgery

## 2016-04-12 DIAGNOSIS — M5412 Radiculopathy, cervical region: Secondary | ICD-10-CM

## 2016-04-16 ENCOUNTER — Ambulatory Visit (INDEPENDENT_AMBULATORY_CARE_PROVIDER_SITE_OTHER): Payer: Medicare Other

## 2016-04-16 DIAGNOSIS — J309 Allergic rhinitis, unspecified: Secondary | ICD-10-CM

## 2016-04-22 ENCOUNTER — Ambulatory Visit
Admission: RE | Admit: 2016-04-22 | Discharge: 2016-04-22 | Disposition: A | Discharge status: Home / Self Care | Payer: Medicare Other | Source: Ambulatory Visit | Attending: Neurological Surgery | Admitting: Neurological Surgery

## 2016-04-22 DIAGNOSIS — M50223 Other cervical disc displacement at C6-C7 level: Secondary | ICD-10-CM | POA: Diagnosis not present

## 2016-04-22 DIAGNOSIS — M5412 Radiculopathy, cervical region: Secondary | ICD-10-CM

## 2016-04-23 ENCOUNTER — Telehealth: Payer: Self-pay | Admitting: Internal Medicine

## 2016-04-23 ENCOUNTER — Ambulatory Visit (INDEPENDENT_AMBULATORY_CARE_PROVIDER_SITE_OTHER): Payer: Medicare Other | Admitting: *Deleted

## 2016-04-23 DIAGNOSIS — J309 Allergic rhinitis, unspecified: Secondary | ICD-10-CM

## 2016-04-23 NOTE — Telephone Encounter (Signed)
I gave pt. Last dose of vac. This morning. He has an appt. With you this Friday. Just an fyi. I won't make up his vaccine until I hear from you.

## 2016-04-24 DIAGNOSIS — M25571 Pain in right ankle and joints of right foot: Secondary | ICD-10-CM | POA: Diagnosis not present

## 2016-04-24 DIAGNOSIS — M2042 Other hammer toe(s) (acquired), left foot: Secondary | ICD-10-CM | POA: Diagnosis not present

## 2016-04-24 DIAGNOSIS — M65871 Other synovitis and tenosynovitis, right ankle and foot: Secondary | ICD-10-CM | POA: Diagnosis not present

## 2016-04-24 DIAGNOSIS — M79672 Pain in left foot: Secondary | ICD-10-CM | POA: Diagnosis not present

## 2016-04-24 DIAGNOSIS — M25572 Pain in left ankle and joints of left foot: Secondary | ICD-10-CM | POA: Diagnosis not present

## 2016-04-24 DIAGNOSIS — B079 Viral wart, unspecified: Secondary | ICD-10-CM | POA: Diagnosis not present

## 2016-04-26 ENCOUNTER — Encounter: Payer: Self-pay | Admitting: Internal Medicine

## 2016-04-26 ENCOUNTER — Ambulatory Visit (INDEPENDENT_AMBULATORY_CARE_PROVIDER_SITE_OTHER): Payer: Medicare Other | Admitting: Internal Medicine

## 2016-04-26 DIAGNOSIS — J4530 Mild persistent asthma, uncomplicated: Secondary | ICD-10-CM | POA: Diagnosis not present

## 2016-04-26 DIAGNOSIS — J3089 Other allergic rhinitis: Secondary | ICD-10-CM

## 2016-04-26 DIAGNOSIS — J302 Other seasonal allergic rhinitis: Secondary | ICD-10-CM | POA: Diagnosis not present

## 2016-04-26 MED ORDER — FLUTICASONE-UMECLIDIN-VILANT 100-62.5-25 MCG/INH IN AEPB
1.0000 | INHALATION_SPRAY | Freq: Every day | RESPIRATORY_TRACT | Status: DC
Start: 2016-04-26 — End: 2017-01-30

## 2016-04-26 MED ORDER — FLUTICASONE-UMECLIDIN-VILANT 100-62.5-25 MCG/INH IN AEPB: 1.0000 | INHALATION_SPRAY | Freq: Every day | RESPIRATORY_TRACT | 0 refills | Status: DC

## 2016-04-26 MED ORDER — ALBUTEROL SULFATE HFA 108 (90 BASE) MCG/ACT IN AERS: INHALATION_SPRAY | RESPIRATORY_TRACT | 12 refills | Status: DC

## 2016-04-26 NOTE — Patient Instructions (Signed)
Refill sent for Proventil rescue inhaler  Sample, coupon and script to try Trelegy inhaler    Inhale 1 puff, then rinse mouth, once daily   Try this for now instead of Striverdi plus Qvar. Go back to them if you don't like the Trelegy

## 2016-04-26 NOTE — Progress Notes (Signed)
Subjective:    Patient ID: Levi [name], male    DOB: 04-Apr-1946, 70 y.o.   MRN: [id number] HPI  Male never smoker followed for allergy/asthma, rhinitis, history  ABPA, complicated by PAfib PFT- 01/04/2015-minimal restriction, minimal diffusion defect, insignificant response to bronchodilator. FVC 4.55/86%, FEV1 3.28/83%, FEV1/FVC 0.72, TLC 75%, DLCO 77  .  10/26/2015-70 year old male never smoker followed for allergy/asthma, rhinitis, history ABPA, complicated by PAfib Allergy Vaccine 1:10 GH FOLLOWS FOR: Pt still on vaccine and denies any issues with allergies at this time. Tolerated hip replacement surgery in March. Some increased cough and shortness of breath. He doesn't think Qvar helps and he continues maintenance prednisone 10 mg daily. We discussed medication options. Note mild anemia at last check. CXR 07/25/2015-NAD with mild hyperinflation, prior orthopedic surgery changes.  04/26/2016-70 year old male never smoker followed for allergy/asthma, rhinitis, history ABPA, complicated by PAfib Allergy Vaccine 1:10 GH FOLLOWS FOR: no complaints on allergy vaccine.  Denies any breathing complaints today.  History is from out of his current allergy vaccine supply and we have agreed to stop allergy shots at this time for observation. Took an antibiotic and prednisone a few weeks ago for a bronchitis syndrome. Disliked Bevespi, said Qvar did not help, Advair and Symbicort blamed for cramps, Stiverdi ok. We discussed anti-inflammatory maintenance inhalers compared with active bronchodilators. He uses rescue inhaler at least once most days. Little sleep disturbance.  ROS-see HPI   Negative unless "+" Constitutional:    weight loss, night sweats, fevers, chills, fatigue, lassitude. HEENT:    headaches, difficulty swallowing, tooth/dental problems, sore throat,       sneezing, itching, ear ache, nasal congestion, post nasal drip, snoring CV:    chest pain, orthopnea, PND, swelling in lower  extremities, anasarca,                                                    dizziness, palpitations Resp:   + shortness of breath with exertion or at rest.                productive cough,   non-productive cough, coughing up of blood.              change in color of mucus.  + wheezing.   Skin:    rash or lesions. GI:  No-   heartburn, indigestion, abdominal pain, nausea, vomiting, diarrhea,                 change in bowel habits, loss of appetite GU: dysuria, change in color of urine, no urgency or frequency.   flank pain. MS:   joint pain, stiffness, decreased range of motion, back pain. Neuro-     nothing unusual Psych:  change in mood or affect.  depression or anxiety.   memory loss.    Objective:  OBJ- Physical Exam General- Alert, Oriented, Affect-appropriate, Distress- none acute, tall/thin Skin- rash-none, lesions- none, excoriation- none Lymphadenopathy- none Head- atraumatic            Eyes- Gross vision intact, PERRLA, conjunctivae and secretions clear            Ears- Hearing, canals-normal            Nose- Clear, no-Septal dev, mucus, polyps, erosion, perforation             Throat- Mallampati II ,  mucosa clear , drainage- none, tonsils- atrophic Neck- flexible , trachea midline, no stridor , thyroid nl, carotid no bruit Chest - symmetrical excursion , unlabored           Heart/CV- RRR , no murmur , no gallop  , no rub, nl s1 s2                           - JVD- none , edema- none, stasis changes- none, varices- none           Lung- clear to P&A, wheeze- none, cough- none , dullness-none, rub- none           Chest wall-  Abd-  Br/ Gen/ Rectal- Not done, not indicated Extrem- cyanosis- none, clubbing, none, atrophy- none, strength- nl Neuro- grossly intact to observation    Assessment & Plan:

## 2016-04-26 NOTE — Assessment & Plan Note (Signed)
He is out of current vaccine supply. This is good timing. We are closing her allergy vaccine program at this office. He is advised to wait and watch through season changes. If available meds to provide active control then he could see another allergy practice.

## 2016-04-26 NOTE — Telephone Encounter (Signed)
Forwarding to [employer] to make aware.  Nothing further needed.

## 2016-04-26 NOTE — Telephone Encounter (Signed)
Noted, thanks!

## 2016-04-26 NOTE — Assessment & Plan Note (Signed)
Usually good control. He is expecting to feel active bronchodilatation from steroid inhalers and we discussed this. I would like him to continue using a steroid inhaler. Plan-Sample Trelegy for trial as discussed

## 2016-04-26 NOTE — Telephone Encounter (Signed)
He will stop allergy shots now as he has run out of vaccine

## 2016-04-30 ENCOUNTER — Ambulatory Visit: Payer: Medicare Other

## 2016-05-01 ENCOUNTER — Other Ambulatory Visit: Payer: Self-pay | Admitting: Internal Medicine

## 2016-05-02 DIAGNOSIS — M5412 Radiculopathy, cervical region: Secondary | ICD-10-CM | POA: Diagnosis not present

## 2016-05-07 ENCOUNTER — Telehealth: Payer: Self-pay | Admitting: Internal Medicine

## 2016-05-07 MED ORDER — BECLOMETHASONE DIPROPIONATE 80 MCG/ACT IN AERS: INHALATION_SPRAY | RESPIRATORY_TRACT | 5 refills | Status: DC

## 2016-05-07 NOTE — Telephone Encounter (Signed)
Called and spoke to pt. Pt states the Trelegy did not work well for him and he would like to go back to [place]. Pt state she is needing a refill of Qvar. Rx sent to preferred pharmacy. Pt verbalized understanding and denied any further questions or concerns at this time.    Will send to CY as FYI.

## 2016-05-13 DIAGNOSIS — M6588 Other synovitis and tenosynovitis, other site: Secondary | ICD-10-CM | POA: Diagnosis not present

## 2016-05-13 DIAGNOSIS — M2042 Other hammer toe(s) (acquired), left foot: Secondary | ICD-10-CM | POA: Diagnosis not present

## 2016-05-13 DIAGNOSIS — M19071 Primary osteoarthritis, right ankle and foot: Secondary | ICD-10-CM | POA: Diagnosis not present

## 2016-05-13 DIAGNOSIS — M722 Plantar fascial fibromatosis: Secondary | ICD-10-CM | POA: Diagnosis not present

## 2016-05-24 DIAGNOSIS — B079 Viral wart, unspecified: Secondary | ICD-10-CM | POA: Diagnosis not present

## 2016-05-24 DIAGNOSIS — M722 Plantar fascial fibromatosis: Secondary | ICD-10-CM | POA: Diagnosis not present

## 2016-05-24 DIAGNOSIS — M79672 Pain in left foot: Secondary | ICD-10-CM | POA: Diagnosis not present

## 2016-05-24 DIAGNOSIS — M71572 Other bursitis, not elsewhere classified, left ankle and foot: Secondary | ICD-10-CM | POA: Diagnosis not present

## 2016-05-24 DIAGNOSIS — M71571 Other bursitis, not elsewhere classified, right ankle and foot: Secondary | ICD-10-CM | POA: Diagnosis not present

## 2016-06-11 DIAGNOSIS — M5412 Radiculopathy, cervical region: Secondary | ICD-10-CM | POA: Diagnosis not present

## 2016-06-17 DIAGNOSIS — M7062 Trochanteric bursitis, left hip: Secondary | ICD-10-CM | POA: Diagnosis not present

## 2016-06-17 DIAGNOSIS — M25552 Pain in left hip: Secondary | ICD-10-CM | POA: Diagnosis not present

## 2016-06-21 DIAGNOSIS — M79672 Pain in left foot: Secondary | ICD-10-CM | POA: Diagnosis not present

## 2016-06-21 DIAGNOSIS — M722 Plantar fascial fibromatosis: Secondary | ICD-10-CM | POA: Diagnosis not present

## 2016-06-21 DIAGNOSIS — M71571 Other bursitis, not elsewhere classified, right ankle and foot: Secondary | ICD-10-CM | POA: Diagnosis not present

## 2016-06-21 DIAGNOSIS — B079 Viral wart, unspecified: Secondary | ICD-10-CM | POA: Diagnosis not present

## 2016-07-03 ENCOUNTER — Telehealth: Payer: Self-pay | Admitting: Internal Medicine

## 2016-07-03 MED ORDER — OSELTAMIVIR PHOSPHATE 75 MG PO CAPS: 75.0000 mg | ORAL_CAPSULE | Freq: Two times a day (BID) | ORAL | 0 refills | Status: DC

## 2016-07-03 NOTE — Telephone Encounter (Signed)
Spoke with pt, who states his grandchildren recently tested positive for the flu. Pt has developed increased sob, prod cough with green mucus, wheezing, chest congestion, body aches, temp of 100.8 last night & chills X 2d Pt had taken tylenol last night to help with the temp.  Pt using albuterol hfa tid with mild relief.  Pt is requesting recommendations.   CY please advise. Thanks.   Current Outpatient Prescriptions on File Prior to Visit  Medication Sig Dispense Refill  . albuterol (PROVENTIL HFA) 108 (90 Base) MCG/ACT inhaler INHALE 2 PUFFS INTO THE LUNGS EVERY 4 (FOUR) HOURS AS NEEDED FOR WHEEZING. 6.7 each 12  . allopurinol (ZYLOPRIM) 100 MG tablet Take 100 mg by mouth daily.      [name] aspirin EC 81 MG tablet Take 1 tablet (81 mg total) by mouth daily. 90 tablet 3  . atorvastatin (LIPITOR) 10 MG tablet Take 1 tablet (10 mg total) by mouth daily. 90 tablet 3  . beclomethasone (QVAR) 80 MCG/ACT inhaler INHALE 2 PUFFS INTO THE LUNGS 2 (TWO) TIMES DAILY. 8.7 g 5  . candesartan (ATACAND) 4 MG tablet TAKE 0.5 TABLETS (2 MG TOTAL) BY MOUTH DAILY. 15 tablet 10  . diltiazem (CARDIZEM CD) 180 MG 24 hr capsule TAKE 1 CAPSULE (180 MG TOTAL) BY MOUTH DAILY. 30 capsule 7  . diltiazem (CARDIZEM) 30 MG tablet Take 1 tablet every 4 hours AS NEEDED for afib HR >100 and BP >100 30 tablet 0  . Fluticasone-Umeclidin-Vilant (TRELEGY ELLIPTA) 100-62.5-25 MCG/INH AEPB Inhale 1 puff into the lungs daily. 1 each inhaler  . Fluticasone-Umeclidin-Vilant (TRELEGY ELLIPTA) 100-62.5-25 MCG/INH AEPB Inhale 1 puff into the lungs daily. 1 each 0  . isosorbide mononitrate (IMDUR) 30 MG 24 hr tablet TAKE 1 TABLET (30 MG TOTAL) BY MOUTH DAILY. 30 tablet 11  . pantoprazole (PROTONIX) 40 MG tablet TAKE 1 TABLET BY MOUTH TWICE A DAY 60 tablet 11  . predniSONE (DELTASONE) 10 MG tablet TAKE 1 TABLET (10 MG TOTAL) BY MOUTH DAILY. CONTINUOUS 30 tablet 11  . STRIVERDI RESPIMAT 2.5 MCG/ACT AERS INHALE 2 PUFFS INTO THE LUNGS DAILY. 1  Inhaler 6  . tamsulosin (FLOMAX) 0.4 MG CAPS capsule Take 0.4 mg by mouth at bedtime.  11   No current facility-administered medications on file prior to visit.     Allergies  Allergen Reactions  . Morphine And Related Palpitations    Light headed and sweaty; "my heart races" (04/28/2012), pt states too much morphine gave him this reaction  . Levaquin [Levofloxacin] Itching

## 2016-07-03 NOTE — Telephone Encounter (Signed)
Recommend Tamiflu 75 mg, # 10, 1 twice daily  Stay well- hydrated

## 2016-07-03 NOTE — Telephone Encounter (Signed)
Pt aware of CY's recommendations and voiced her understanding. Rx sent to preferred pharmacy. Nothing further needed.

## 2016-07-08 ENCOUNTER — Telehealth: Payer: Self-pay | Admitting: Internal Medicine

## 2016-07-08 MED ORDER — AMOXICILLIN-POT CLAVULANATE 875-125 MG PO TABS: 1.0000 | ORAL_TABLET | Freq: Two times a day (BID) | ORAL | 0 refills | Status: DC

## 2016-07-08 NOTE — Telephone Encounter (Signed)
States if he is gone, we may speak with his wife.

## 2016-07-08 NOTE — Telephone Encounter (Signed)
Spoke with Levi [name]. States that he is not feeling any better since calling last week. Reports wheezing, cough, chest tightness and SOB. Cough is now producing green mucus. Denies fever/chills/sweats. Symptoms started over 1 week ago. Was given Tamiflu last week but doesn't feel like this has helped. Would like an antibiotic sent in.  CY - please advise. Thanks.  Allergies  Allergen Reactions  . Morphine And Related Palpitations    Light headed and sweaty; "my heart races" (04/28/2012), Levi [name] states too much morphine gave him this reaction  . Levaquin [Levofloxacin] Itching   Current Outpatient Prescriptions on File Prior to Visit  Medication Sig Dispense Refill  . albuterol (PROVENTIL HFA) 108 (90 Base) MCG/ACT inhaler INHALE 2 PUFFS INTO THE LUNGS EVERY 4 (FOUR) HOURS AS NEEDED FOR WHEEZING. 6.7 each 12  . allopurinol (ZYLOPRIM) 100 MG tablet Take 100 mg by mouth daily.      [name] aspirin EC 81 MG tablet Take 1 tablet (81 mg total) by mouth daily. 90 tablet 3  . atorvastatin (LIPITOR) 10 MG tablet Take 1 tablet (10 mg total) by mouth daily. 90 tablet 3  . beclomethasone (QVAR) 80 MCG/ACT inhaler INHALE 2 PUFFS INTO THE LUNGS 2 (TWO) TIMES DAILY. 8.7 g 5  . candesartan (ATACAND) 4 MG tablet TAKE 0.5 TABLETS (2 MG TOTAL) BY MOUTH DAILY. 15 tablet 10  . diltiazem (CARDIZEM CD) 180 MG 24 hr capsule TAKE 1 CAPSULE (180 MG TOTAL) BY MOUTH DAILY. 30 capsule 7  . diltiazem (CARDIZEM) 30 MG tablet Take 1 tablet every 4 hours AS NEEDED for afib HR >100 and BP >100 30 tablet 0  . Fluticasone-Umeclidin-Vilant (TRELEGY ELLIPTA) 100-62.5-25 MCG/INH AEPB Inhale 1 puff into the lungs daily. 1 each inhaler  . Fluticasone-Umeclidin-Vilant (TRELEGY ELLIPTA) 100-62.5-25 MCG/INH AEPB Inhale 1 puff into the lungs daily. 1 each 0  . isosorbide mononitrate (IMDUR) 30 MG 24 hr tablet TAKE 1 TABLET (30 MG TOTAL) BY MOUTH DAILY. 30 tablet 11  . oseltamivir (TAMIFLU) 75 MG capsule Take 1 capsule (75 mg total) by mouth 2 (two)  times daily. 10 capsule 0  . pantoprazole (PROTONIX) 40 MG tablet TAKE 1 TABLET BY MOUTH TWICE A DAY 60 tablet 11  . predniSONE (DELTASONE) 10 MG tablet TAKE 1 TABLET (10 MG TOTAL) BY MOUTH DAILY. CONTINUOUS 30 tablet 11  . STRIVERDI RESPIMAT 2.5 MCG/ACT AERS INHALE 2 PUFFS INTO THE LUNGS DAILY. 1 Inhaler 6  . tamsulosin (FLOMAX) 0.4 MG CAPS capsule Take 0.4 mg by mouth at bedtime.  11   No current facility-administered medications on file prior to visit.

## 2016-07-08 NOTE — Telephone Encounter (Signed)
Spoke with pt's spouse, and made her aware of CY's recommendations. RX sent to preferred pharmacy. Nothing further needed.

## 2016-07-08 NOTE — Telephone Encounter (Signed)
Offer augmentin 875, # 14, 1 twice daily 

## 2016-07-23 DIAGNOSIS — M25552 Pain in left hip: Secondary | ICD-10-CM | POA: Diagnosis not present

## 2016-07-24 DIAGNOSIS — M1990 Unspecified osteoarthritis, unspecified site: Secondary | ICD-10-CM | POA: Diagnosis not present

## 2016-07-24 DIAGNOSIS — M25552 Pain in left hip: Secondary | ICD-10-CM | POA: Diagnosis not present

## 2016-07-29 ENCOUNTER — Telehealth: Payer: Self-pay | Admitting: Internal Medicine

## 2016-07-29 MED ORDER — UMECLIDINIUM BROMIDE 62.5 MCG/INH IN AEPB: 1.0000 | INHALATION_SPRAY | Freq: Every day | RESPIRATORY_TRACT | 12 refills | Status: DC

## 2016-07-29 NOTE — Telephone Encounter (Signed)
Per CY-change QVAR redihaler to Incruse #1 1 puff QD with 12 refills. Pharmacy to instruct patient on how to use inhaler. Nothing more needed at this time.

## 2016-07-31 DIAGNOSIS — M6289 Other specified disorders of muscle: Secondary | ICD-10-CM | POA: Diagnosis not present

## 2016-08-08 DIAGNOSIS — M79671 Pain in right foot: Secondary | ICD-10-CM | POA: Diagnosis not present

## 2016-08-08 DIAGNOSIS — M25561 Pain in right knee: Secondary | ICD-10-CM | POA: Diagnosis not present

## 2016-08-19 DIAGNOSIS — Z1159 Encounter for screening for other viral diseases: Secondary | ICD-10-CM | POA: Diagnosis not present

## 2016-08-19 DIAGNOSIS — B370 Candidal stomatitis: Secondary | ICD-10-CM | POA: Diagnosis not present

## 2016-08-19 DIAGNOSIS — I4891 Unspecified atrial fibrillation: Secondary | ICD-10-CM | POA: Diagnosis not present

## 2016-08-19 DIAGNOSIS — Z0000 Encounter for general adult medical examination without abnormal findings: Secondary | ICD-10-CM | POA: Diagnosis not present

## 2016-08-19 DIAGNOSIS — M109 Gout, unspecified: Secondary | ICD-10-CM | POA: Diagnosis not present

## 2016-08-19 DIAGNOSIS — E784 Other hyperlipidemia: Secondary | ICD-10-CM | POA: Diagnosis not present

## 2016-08-22 ENCOUNTER — Other Ambulatory Visit: Payer: Self-pay

## 2016-08-22 DIAGNOSIS — M25552 Pain in left hip: Secondary | ICD-10-CM | POA: Diagnosis not present

## 2016-08-29 DIAGNOSIS — R7301 Impaired fasting glucose: Secondary | ICD-10-CM | POA: Diagnosis not present

## 2016-08-31 ENCOUNTER — Other Ambulatory Visit: Payer: Self-pay | Admitting: Cardiology

## 2016-08-31 ENCOUNTER — Other Ambulatory Visit: Payer: Self-pay | Admitting: Internal Medicine

## 2016-09-02 ENCOUNTER — Telehealth: Payer: Self-pay | Admitting: Internal Medicine

## 2016-09-02 NOTE — Telephone Encounter (Signed)
LMTCB

## 2016-09-02 NOTE — Telephone Encounter (Signed)
Spoke with the pt  He states confused b/c his pharmacy gave him rx for Incruse  He did not know he was supposed to be taking this  Joellen Jersey- looks like you sent in rx for this on 07/29/16  Can you please help with this? I don't see where CD[facility] rec this Thanks

## 2016-09-02 NOTE — Telephone Encounter (Signed)
Me      07/29/16 2:27 PM  Note    Per CY-change QVAR redihaler to Incruse #1 1 puff QD with 12 refills. Pharmacy to instruct patient on how to use inhaler. Nothing more needed at this time.     [id number] - [place], [place] - [address] (Pharmacy)  to Me       07/29/16 2:27 PM  Insurance will not cover QVAR Redihaler-will need to change to another inhaler.

## 2016-09-03 ENCOUNTER — Telehealth: Payer: Self-pay | Admitting: Internal Medicine

## 2016-09-03 NOTE — Telephone Encounter (Signed)
Ok to take both Incruse and Striverdi.

## 2016-09-03 NOTE — Telephone Encounter (Signed)
Pt returning call and can be reached @ [phone].Levi [name]

## 2016-09-03 NOTE — Telephone Encounter (Signed)
Called and spoke with pts wife and she is aware of the reason for the change in med.  She will inform the pt. Nothing further is needed.

## 2016-09-03 NOTE — Telephone Encounter (Signed)
lmtcb for pt.  

## 2016-09-03 NOTE — Telephone Encounter (Signed)
Patient's mediation was changed from Qvar to Incruse and the patient is wanting to know if he is supposed to continue taking Striverdi. Please advise Dr Annamaria Boots. Thanks.    07/29/16 2:27 PM  Note    Per CY-change QVAR redihaler to Incruse #1 1 puff QD with 12 refills. Pharmacy to instruct patient on how to use inhaler. Nothing more needed at this time.     [id number] - [place], [place] - [address] (Pharmacy)  to Me       07/29/16 2:27 PM  Insurance will not cover QVAR Redihaler-will need to change to another inhaler.   Allergies as of 09/03/2016      Reactions   Morphine And Related Palpitations   Light headed and sweaty; "my heart races" (04/28/2012), pt states too much morphine gave him this reaction   Levaquin [levofloxacin] Itching      Medication List       Accurate as of 09/03/16 12:06 PM. Always use your most recent med list.          albuterol 108 (90 Base) MCG/ACT inhaler Commonly known as:  PROVENTIL HFA INHALE 2 PUFFS INTO THE LUNGS EVERY 4 (FOUR) HOURS AS NEEDED FOR WHEEZING.   allopurinol 100 MG tablet Commonly known as:  ZYLOPRIM Take 100 mg by mouth daily.   amoxicillin-clavulanate 875-125 MG tablet Commonly known as:  AUGMENTIN Take 1 tablet by mouth 2 (two) times daily.   aspirin EC 81 MG tablet Take 1 tablet (81 mg total) by mouth daily.   atorvastatin 10 MG tablet Commonly known as:  LIPITOR Take 1 tablet (10 mg total) by mouth daily.   candesartan 4 MG tablet Commonly known as:  ATACAND TAKE 0.5 TABLETS (2 MG TOTAL) BY MOUTH DAILY.   diltiazem 180 MG 24 hr capsule Commonly known as:  CARDIZEM CD TAKE 1 CAPSULE (180 MG TOTAL) BY MOUTH DAILY.   diltiazem 30 MG tablet Commonly known as:  CARDIZEM Take 1 tablet every 4 hours AS NEEDED for afib HR >100 and BP >100   Fluticasone-Umeclidin-Vilant 100-62.5-25 MCG/INH Aepb Commonly known as:  TRELEGY ELLIPTA Inhale 1 puff into the lungs daily.     Fluticasone-Umeclidin-Vilant 100-62.5-25 MCG/INH Aepb Commonly known as:  TRELEGY ELLIPTA Inhale 1 puff into the lungs daily.   isosorbide mononitrate 30 MG 24 hr tablet Commonly known as:  IMDUR TAKE 1 TABLET (30 MG TOTAL) BY MOUTH DAILY.   oseltamivir 75 MG capsule Commonly known as:  TAMIFLU Take 1 capsule (75 mg total) by mouth 2 (two) times daily.   pantoprazole 40 MG tablet Commonly known as:  PROTONIX TAKE 1 TABLET BY MOUTH TWICE A DAY   predniSONE 10 MG tablet Commonly known as:  DELTASONE TAKE 1 TABLET (10 MG TOTAL) BY MOUTH DAILY. CONTINUOUS   STRIVERDI RESPIMAT 2.5 MCG/ACT Aers Generic drug:  Olodaterol HCl INHALE 2 PUFFS INTO THE LUNGS DAILY.   tamsulosin 0.4 MG Caps capsule Commonly known as:  FLOMAX Take 0.4 mg by mouth at bedtime.   umeclidinium bromide 62.5 MCG/INH Aepb Commonly known as:  INCRUSE ELLIPTA Inhale 1 puff into the lungs daily.

## 2016-09-03 NOTE — Telephone Encounter (Signed)
Called and spoke with pt and he is aware of CY recs.  Nothing further is needed.

## 2016-09-06 ENCOUNTER — Other Ambulatory Visit: Payer: Self-pay | Admitting: Internal Medicine

## 2016-09-16 DIAGNOSIS — R2689 Other abnormalities of gait and mobility: Secondary | ICD-10-CM | POA: Diagnosis not present

## 2016-09-16 DIAGNOSIS — M6281 Muscle weakness (generalized): Secondary | ICD-10-CM | POA: Diagnosis not present

## 2016-09-16 DIAGNOSIS — M25571 Pain in right ankle and joints of right foot: Secondary | ICD-10-CM | POA: Diagnosis not present

## 2016-09-16 DIAGNOSIS — M25552 Pain in left hip: Secondary | ICD-10-CM | POA: Diagnosis not present

## 2016-09-18 DIAGNOSIS — M25552 Pain in left hip: Secondary | ICD-10-CM | POA: Diagnosis not present

## 2016-09-18 DIAGNOSIS — R2689 Other abnormalities of gait and mobility: Secondary | ICD-10-CM | POA: Diagnosis not present

## 2016-09-18 DIAGNOSIS — M6281 Muscle weakness (generalized): Secondary | ICD-10-CM | POA: Diagnosis not present

## 2016-09-18 DIAGNOSIS — M25571 Pain in right ankle and joints of right foot: Secondary | ICD-10-CM | POA: Diagnosis not present

## 2016-09-20 DIAGNOSIS — M25571 Pain in right ankle and joints of right foot: Secondary | ICD-10-CM | POA: Diagnosis not present

## 2016-09-20 DIAGNOSIS — M6281 Muscle weakness (generalized): Secondary | ICD-10-CM | POA: Diagnosis not present

## 2016-09-20 DIAGNOSIS — M25552 Pain in left hip: Secondary | ICD-10-CM | POA: Diagnosis not present

## 2016-09-20 DIAGNOSIS — R2689 Other abnormalities of gait and mobility: Secondary | ICD-10-CM | POA: Diagnosis not present

## 2016-09-23 DIAGNOSIS — M25571 Pain in right ankle and joints of right foot: Secondary | ICD-10-CM | POA: Diagnosis not present

## 2016-09-23 DIAGNOSIS — R2689 Other abnormalities of gait and mobility: Secondary | ICD-10-CM | POA: Diagnosis not present

## 2016-09-23 DIAGNOSIS — M7062 Trochanteric bursitis, left hip: Secondary | ICD-10-CM | POA: Diagnosis not present

## 2016-09-23 DIAGNOSIS — M6281 Muscle weakness (generalized): Secondary | ICD-10-CM | POA: Diagnosis not present

## 2016-09-23 DIAGNOSIS — M7061 Trochanteric bursitis, right hip: Secondary | ICD-10-CM | POA: Diagnosis not present

## 2016-09-23 DIAGNOSIS — M25552 Pain in left hip: Secondary | ICD-10-CM | POA: Diagnosis not present

## 2016-09-25 DIAGNOSIS — R2689 Other abnormalities of gait and mobility: Secondary | ICD-10-CM | POA: Diagnosis not present

## 2016-09-25 DIAGNOSIS — M25552 Pain in left hip: Secondary | ICD-10-CM | POA: Diagnosis not present

## 2016-09-25 DIAGNOSIS — M6281 Muscle weakness (generalized): Secondary | ICD-10-CM | POA: Diagnosis not present

## 2016-09-25 DIAGNOSIS — M25571 Pain in right ankle and joints of right foot: Secondary | ICD-10-CM | POA: Diagnosis not present

## 2016-09-27 DIAGNOSIS — R2689 Other abnormalities of gait and mobility: Secondary | ICD-10-CM | POA: Diagnosis not present

## 2016-09-27 DIAGNOSIS — M25571 Pain in right ankle and joints of right foot: Secondary | ICD-10-CM | POA: Diagnosis not present

## 2016-09-27 DIAGNOSIS — M25552 Pain in left hip: Secondary | ICD-10-CM | POA: Diagnosis not present

## 2016-09-27 DIAGNOSIS — M6281 Muscle weakness (generalized): Secondary | ICD-10-CM | POA: Diagnosis not present

## 2016-09-28 ENCOUNTER — Other Ambulatory Visit: Payer: Self-pay | Admitting: Cardiology

## 2016-10-01 DIAGNOSIS — R2689 Other abnormalities of gait and mobility: Secondary | ICD-10-CM | POA: Diagnosis not present

## 2016-10-01 DIAGNOSIS — M25571 Pain in right ankle and joints of right foot: Secondary | ICD-10-CM | POA: Diagnosis not present

## 2016-10-01 DIAGNOSIS — M6281 Muscle weakness (generalized): Secondary | ICD-10-CM | POA: Diagnosis not present

## 2016-10-01 DIAGNOSIS — M25552 Pain in left hip: Secondary | ICD-10-CM | POA: Diagnosis not present

## 2016-10-03 DIAGNOSIS — M6281 Muscle weakness (generalized): Secondary | ICD-10-CM | POA: Diagnosis not present

## 2016-10-03 DIAGNOSIS — M25571 Pain in right ankle and joints of right foot: Secondary | ICD-10-CM | POA: Diagnosis not present

## 2016-10-03 DIAGNOSIS — R2689 Other abnormalities of gait and mobility: Secondary | ICD-10-CM | POA: Diagnosis not present

## 2016-10-03 DIAGNOSIS — B351 Tinea unguium: Secondary | ICD-10-CM | POA: Diagnosis not present

## 2016-10-03 DIAGNOSIS — B370 Candidal stomatitis: Secondary | ICD-10-CM | POA: Diagnosis not present

## 2016-10-03 DIAGNOSIS — M25552 Pain in left hip: Secondary | ICD-10-CM | POA: Diagnosis not present

## 2016-10-07 ENCOUNTER — Encounter (INDEPENDENT_AMBULATORY_CARE_PROVIDER_SITE_OTHER): Payer: Self-pay

## 2016-10-07 ENCOUNTER — Encounter: Payer: Self-pay | Admitting: Cardiology

## 2016-10-07 ENCOUNTER — Ambulatory Visit (INDEPENDENT_AMBULATORY_CARE_PROVIDER_SITE_OTHER): Payer: Medicare Other | Admitting: Cardiology

## 2016-10-07 VITALS — BP 108/70 | HR 78 | Ht 74.0 in | Wt 169.6 lb

## 2016-10-07 DIAGNOSIS — I480 Paroxysmal atrial fibrillation: Secondary | ICD-10-CM

## 2016-10-07 DIAGNOSIS — J449 Chronic obstructive pulmonary disease, unspecified: Secondary | ICD-10-CM | POA: Diagnosis not present

## 2016-10-07 DIAGNOSIS — E7800 Pure hypercholesterolemia, unspecified: Secondary | ICD-10-CM

## 2016-10-07 NOTE — Patient Instructions (Signed)

## 2016-10-07 NOTE — Progress Notes (Signed)
[place]. [address] [place], [place]  [address] Phone: [phone] Fax:  [phone]  Date:  10/07/2016   ID:  Levi [name], DOB 1946/01/02, MRN [id number]  PCP:  Orpah Melter, MD   History of Present Illness: Levi [name] is a 71 y.o. male paroxysmal atrial fibrillation first noted in December of 2013 then again in 2016 here for followup.  PAF-low-dose Cardizem is being utilized. Echocardiogram demonstrated EF in the 50% range. Prior catheterization in 2010 showed no flow-limiting CAD.  Baseline COPD followed in pulmonary clinic.   CHADSVAS - 1 (age).  Occasional swelling in his ankles, question Tiazac. When exercising, he may rarely feel a single pounding heartbeat. No shortness of breath, no syncope. He is had issues with his rods in his back. Long-term prednisone has made his bones frail.  Rare isolated pounding, PVC-like sensation. Occasional twinge in chest. Wife ran [employer].    He was seen in the atrial fibrillation clinic on 12/14/14. In review of their note, he was diagnosed back in December 2013 with low atrial fibrillation burden and on diltiazem 180 a day. He woke up in atrial fibrillation and was found to have a heart rate of 110 and felt dizzy. Went to the emergency room in [place], Ativan was given and when he was exercising he returned to sinus rhythm.his episode lasted for about 10 hours. Cardizem 30 mg was given for breakthrough episodes. He does not want to move onto antiarrhythmic therapy. Continue with low-dose aspirin because he has a score of 1.  On 07/31/15 he underwent right hip arthroplasty. He is working out at [employer]. Occasionally will feel pinpoint discomfort right and left chest wall, along medial segment of bicep.  10/07/16-overall no change in symptoms, no chest pain, no shortness of breath, no syncope. Occasional bruising on arms. Rare palpitation.  Wt Readings from Last 3 Encounters:  10/07/16 169 lb 9.6 oz (76.9  kg)  04/26/16 163 lb (73.9 kg)  04/09/16 162 lb (73.5 kg)     Past Medical History:  Diagnosis Date  . Agent orange exposure 1970's   takes Imdur,Diltiazem, and Atacand daily  . ALLERGIC RHINITIS   . Asthma   . Bruises easily    d/t prednisone daily  . Chronic bronchitis ([place])    "used to get it q yr; last time ?2008" (04/28/2012)  . COPD (chronic obstructive pulmonary disease) ([facility])    agent orange exposure  . Degenerative disk disease    "qwhere" (04/28/2012)  . Diverticulitis   . Dysrhythmia    afib  . Elevated uric acid in blood    takes Allopurinol daily  . Enlarged prostate    but not on any meds  . GERD (gastroesophageal reflux disease)    takes Protonix daily  . H/O hiatal hernia   . History of pneumonia    a. 2010  . Hyperlipidemia    takes Lipitor daily; pt. states he takes a preventive  . Joint pain   . Joint swelling   . PAF (paroxysmal atrial fibrillation) ([place])    a. Dx 04/2012, CHA2DS2VASc = 1 (age);  b. 04/2012 Echo: EF 40-50%, mild MR.  . Personal history of colonic adenomas 02/09/2013  . Shortness of breath dyspnea     Past Surgical History:  Procedure Laterality Date  . ANTERIOR CERVICAL DECOMP/DISCECTOMY FUSION N/A 07/21/2012   Procedure: ANTERIOR CERVICAL DECOMPRESSION/DISCECTOMY FUSION 2 LEVELS;  Surgeon: [name], MD;  Location: [facility]  NEURO ORS;  Service: Neurosurgery;  Laterality: N/A;  C4-5 C5-6 Anterior cervical decompression/diskectomy/fusion  . BACK SURGERY     x 3  . CARDIAC CATHETERIZATION  11/2008   [name] - 20% calcified non flow limiting left main, 50% EF apical hypokinesis  . CHOLECYSTECTOMY    . COLONOSCOPY    . ESOPHAGOGASTRODUODENOSCOPY    . EYE SURGERY Bilateral 2011   steroidal encapsulation" (04/28/2012)  . FOOT SURGERY Right    x 2  . KNEE ARTHROSCOPY Bilateral   . LATERAL / POSTERIOR COMBINED FUSION LUMBAR SPINE  2012  . REVERSE SHOULDER ARTHROPLASTY  04/28/2012   Procedure: REVERSE SHOULDER ARTHROPLASTY;  Surgeon:  [name], MD;  Location: [facility];  Service: Orthopedics;  Laterality: Left;  Left shouder reverse total shoulder arthroplasty  . SHOULDER SURGERY    . [place]  . TOTAL HIP ARTHROPLASTY Left 2011   "left" (04/28/2012)  . TOTAL HIP ARTHROPLASTY Right 07/31/2015   Procedure: TOTAL HIP ARTHROPLASTY ANTERIOR APPROACH;  Surgeon: [name], MD;  Location: [facility];  Service: Orthopedics;  Laterality: Right;  . TOTAL KNEE ARTHROPLASTY  01/10/2012   Procedure: TOTAL KNEE ARTHROPLASTY;  Surgeon: [name], MD;  Location: [facility];  Service: Orthopedics;;  left total knee arthroplasty  . TOTAL KNEE ARTHROPLASTY Right 06/17/2014   Procedure: TOTAL KNEE ARTHROPLASTY;  Surgeon: [name], MD;  Location: [facility];  Service: Orthopedics;  Laterality: Right;    Current Outpatient Prescriptions  Medication Sig Dispense Refill  . albuterol (PROVENTIL HFA) 108 (90 Base) MCG/ACT inhaler INHALE 2 PUFFS INTO THE LUNGS EVERY 4 (FOUR) HOURS AS NEEDED FOR WHEEZING. 6.7 each 12  . allopurinol (ZYLOPRIM) 100 MG tablet Take 100 mg by mouth daily.      [name] aspirin EC 81 MG tablet Take 1 tablet (81 mg total) by mouth daily. 90 tablet 3  . atorvastatin (LIPITOR) 10 MG tablet Take 1 tablet (10 mg total) by mouth daily. 90 tablet 3  . candesartan (ATACAND) 4 MG tablet TAKE 1/2 TABLETS (2 MG TOTAL) BY MOUTH DAILY. 15 tablet 5  . diltiazem (CARDIZEM CD) 180 MG 24 hr capsule TAKE 1 CAPSULE (180 MG TOTAL) BY MOUTH DAILY. 30 capsule 7  . diltiazem (CARDIZEM) 30 MG tablet Take 1 tablet every 4 hours AS NEEDED for afib HR >100 and BP >100 30 tablet 0  . Fluticasone-Umeclidin-Vilant (TRELEGY ELLIPTA) 100-62.5-25 MCG/INH AEPB Inhale 1 puff into the lungs daily. 1 each inhaler  . Fluticasone-Umeclidin-Vilant (TRELEGY ELLIPTA) 100-62.5-25 MCG/INH AEPB Inhale 1 puff into the lungs daily. 1 each 0  . isosorbide mononitrate (IMDUR) 30 MG 24 hr tablet TAKE 1 TABLET (30 MG TOTAL) BY MOUTH DAILY. 30 tablet  5  . pantoprazole (PROTONIX) 40 MG tablet TAKE 1 TABLET BY MOUTH TWICE A DAY 60 tablet 11  . predniSONE (DELTASONE) 10 MG tablet TAKE 1 TABLET (10 MG TOTAL) BY MOUTH DAILY. CONTINUOUS 30 tablet 11  . STRIVERDI RESPIMAT 2.5 MCG/ACT AERS INHALE 2 PUFFS INTO THE LUNGS DAILY. 1 Inhaler 6  . tamsulosin (FLOMAX) 0.4 MG CAPS capsule Take 0.4 mg by mouth at bedtime.  11  . umeclidinium bromide (INCRUSE ELLIPTA) 62.5 MCG/INH AEPB Inhale 1 puff into the lungs daily. 1 each 12   No current facility-administered medications for this visit.     Allergies:    Allergies  Allergen Reactions  . Morphine And Related Palpitations    Light headed and sweaty; "my heart races" (04/28/2012), pt states too  much morphine gave him this reaction  . Levaquin [Levofloxacin] Itching    Social History:  The patient  reports that he has never smoked. He has never used smokeless tobacco. He reports that he does not drink alcohol or use drugs.   ROS:  Please see the history of present illness.      PHYSICAL EXAM: VS:  BP 108/70   Pulse 78   Ht 6\' 2"  (1.88 m)   Wt 169 lb 9.6 oz (76.9 kg)   BMI 21.78 kg/m  GEN: Thin, in no acute distress  HEENT: normal  Neck: no JVD, carotid bruits, or masses Cardiac: RRR; no murmurs, rubs, or gallops,no edema  Respiratory:  clear to auscultation bilaterally, normal work of breathing GI: soft, nontender, nondistended, + BS MS: no deformity or atrophy  Skin: warm and dry, no rash, occasional bruises Neuro:  Alert and Oriented x 3, Strength and sensation are intact Psych: euthymic mood, full affect   EKG:  EKG was ordered today. 10/07/16-sinus rhythm 78 with no other abnormalities personally viewed-On 10/10/15-sinus rhythm, 78, right atrial enlargement personally viewed-no change from prior 03/28/14-sinus rhythm, 68, no other abnormalities, previousSinus bradycardia rate 55 with no other abnormalities. Normal intervals. No change from prior.    Labs: 10/19/13-LDL 73  ASSESSMENT  AND PLAN:  1. Paroxysmal atrial fibrillation-no recent symptomatic episodes, last in 2016. Continue with diltiazem 180. He has diltiazem 30 mg for breakthrough. He has not needed to take this. Certainly this can be contributing somewhat to his intermittent edema as a side effect. This seems to be pretty benign.  We will continue to monitor. No anticoagulation because of CHADSVAS 1.  We had lengthy discussion today about potentially choosing anticoagulation given his score of 1 however he wishes to hold off until 52 which is reasonable. Appreciate previous visit by atrial fibrillation clinic. If he does feel rapid ventricular response, he knows to take an extra diltiazem. Stable 2. COPD-Dr. Annamaria Boots.  prednisone.  No active wheezing heard on exam today. Overall doing well. New inhaler. If atrial fibrillation noted more frequently, could be side effect. 3. I will see him back in 6 months. 4. Continue with lipid modification. No changes made with statin. Low-dose. Atorvastatin 10 mg. doing very well, no myalgias.  Signed, Candee Furbish, MD [facility]  10/07/2016 8:16 AM

## 2016-10-08 DIAGNOSIS — M25552 Pain in left hip: Secondary | ICD-10-CM | POA: Diagnosis not present

## 2016-10-08 DIAGNOSIS — M6281 Muscle weakness (generalized): Secondary | ICD-10-CM | POA: Diagnosis not present

## 2016-10-08 DIAGNOSIS — M25571 Pain in right ankle and joints of right foot: Secondary | ICD-10-CM | POA: Diagnosis not present

## 2016-10-08 DIAGNOSIS — R2689 Other abnormalities of gait and mobility: Secondary | ICD-10-CM | POA: Diagnosis not present

## 2016-10-10 DIAGNOSIS — M25571 Pain in right ankle and joints of right foot: Secondary | ICD-10-CM | POA: Diagnosis not present

## 2016-10-10 DIAGNOSIS — M25552 Pain in left hip: Secondary | ICD-10-CM | POA: Diagnosis not present

## 2016-10-10 DIAGNOSIS — R2689 Other abnormalities of gait and mobility: Secondary | ICD-10-CM | POA: Diagnosis not present

## 2016-10-10 DIAGNOSIS — M6281 Muscle weakness (generalized): Secondary | ICD-10-CM | POA: Diagnosis not present

## 2016-10-15 DIAGNOSIS — R2689 Other abnormalities of gait and mobility: Secondary | ICD-10-CM | POA: Diagnosis not present

## 2016-10-15 DIAGNOSIS — M6281 Muscle weakness (generalized): Secondary | ICD-10-CM | POA: Diagnosis not present

## 2016-10-15 DIAGNOSIS — M25571 Pain in right ankle and joints of right foot: Secondary | ICD-10-CM | POA: Diagnosis not present

## 2016-10-15 DIAGNOSIS — M25552 Pain in left hip: Secondary | ICD-10-CM | POA: Diagnosis not present

## 2016-10-17 DIAGNOSIS — M6281 Muscle weakness (generalized): Secondary | ICD-10-CM | POA: Diagnosis not present

## 2016-10-17 DIAGNOSIS — M25552 Pain in left hip: Secondary | ICD-10-CM | POA: Diagnosis not present

## 2016-10-17 DIAGNOSIS — R2689 Other abnormalities of gait and mobility: Secondary | ICD-10-CM | POA: Diagnosis not present

## 2016-10-17 DIAGNOSIS — M25571 Pain in right ankle and joints of right foot: Secondary | ICD-10-CM | POA: Diagnosis not present

## 2016-10-22 DIAGNOSIS — M6281 Muscle weakness (generalized): Secondary | ICD-10-CM | POA: Diagnosis not present

## 2016-10-22 DIAGNOSIS — M25571 Pain in right ankle and joints of right foot: Secondary | ICD-10-CM | POA: Diagnosis not present

## 2016-10-22 DIAGNOSIS — R2689 Other abnormalities of gait and mobility: Secondary | ICD-10-CM | POA: Diagnosis not present

## 2016-10-22 DIAGNOSIS — M25552 Pain in left hip: Secondary | ICD-10-CM | POA: Diagnosis not present

## 2016-10-24 DIAGNOSIS — M25552 Pain in left hip: Secondary | ICD-10-CM | POA: Diagnosis not present

## 2016-10-24 DIAGNOSIS — M25571 Pain in right ankle and joints of right foot: Secondary | ICD-10-CM | POA: Diagnosis not present

## 2016-10-24 DIAGNOSIS — R2689 Other abnormalities of gait and mobility: Secondary | ICD-10-CM | POA: Diagnosis not present

## 2016-10-24 DIAGNOSIS — M6281 Muscle weakness (generalized): Secondary | ICD-10-CM | POA: Diagnosis not present

## 2016-10-25 ENCOUNTER — Ambulatory Visit (INDEPENDENT_AMBULATORY_CARE_PROVIDER_SITE_OTHER): Payer: Medicare Other | Admitting: Internal Medicine

## 2016-10-25 ENCOUNTER — Other Ambulatory Visit (INDEPENDENT_AMBULATORY_CARE_PROVIDER_SITE_OTHER): Payer: Medicare Other

## 2016-10-25 ENCOUNTER — Ambulatory Visit (INDEPENDENT_AMBULATORY_CARE_PROVIDER_SITE_OTHER)
Admission: RE | Admit: 2016-10-25 | Discharge: 2016-10-25 | Disposition: A | Discharge status: Home / Self Care | Payer: Medicare Other | Source: Ambulatory Visit | Attending: Internal Medicine | Admitting: Internal Medicine

## 2016-10-25 ENCOUNTER — Encounter: Payer: Self-pay | Admitting: Internal Medicine

## 2016-10-25 VITALS — BP 110/60 | HR 58 | Ht 74.0 in | Wt 168.6 lb

## 2016-10-25 DIAGNOSIS — J4540 Moderate persistent asthma, uncomplicated: Secondary | ICD-10-CM | POA: Diagnosis not present

## 2016-10-25 DIAGNOSIS — I480 Paroxysmal atrial fibrillation: Secondary | ICD-10-CM | POA: Diagnosis not present

## 2016-10-25 DIAGNOSIS — J4531 Mild persistent asthma with (acute) exacerbation: Secondary | ICD-10-CM | POA: Diagnosis not present

## 2016-10-25 DIAGNOSIS — R0602 Shortness of breath: Secondary | ICD-10-CM | POA: Diagnosis not present

## 2016-10-25 LAB — CBC WITH DIFFERENTIAL/PLATELET
Basophils Absolute: 0.1 10*3/uL (ref 0.0–0.1)
Basophils Relative: 0.7 % (ref 0.0–3.0)
Eosinophils Absolute: 0.1 10*3/uL (ref 0.0–0.7)
Eosinophils Relative: 0.6 % (ref 0.0–5.0)
HCT: 40.1 % (ref 39.0–52.0)
Hemoglobin: 13.4 g/dL (ref 13.0–17.0)
Lymphocytes Relative: 9.7 % — ABNORMAL LOW (ref 12.0–46.0)
Lymphs Abs: 0.9 10*3/uL (ref 0.7–4.0)
MCHC: 33.4 g/dL (ref 30.0–36.0)
MCV: 90.4 fl (ref 78.0–100.0)
Monocytes Absolute: 0.5 10*3/uL (ref 0.1–1.0)
Monocytes Relative: 5.8 % (ref 3.0–12.0)
Neutro Abs: 7.8 10*3/uL — ABNORMAL HIGH (ref 1.4–7.7)
Neutrophils Relative %: 83.2 % — ABNORMAL HIGH (ref 43.0–77.0)
Platelets: 229 10*3/uL (ref 150.0–400.0)
RBC: 4.44 Mil/uL (ref 4.22–5.81)
RDW: 16.8 % — ABNORMAL HIGH (ref 11.5–15.5)
WBC: 9.4 10*3/uL (ref 4.0–10.5)

## 2016-10-25 NOTE — Patient Instructions (Addendum)
Go ahead and start Incruse now - inhale 1 puff, once daily. Ok to use up the Qvar as you start Incruse.  Order- Office spirometry     Dx   Asthma moderate persistent              Lab- CBC w diff               CXR  Please call as needed

## 2016-10-25 NOTE — Assessment & Plan Note (Signed)
Office spirometry, without direct measurement of lung volumes or diffusion, would be consistent with mild air trapping and minimal obstructive disease. Together with previous PFTs there is not much expectation that bronchodilators will help a lot. Since he has not been trying in [place], I suggested he go ahead now adding that on top of his remaining Qvar. He did not find Trelegy helpful. He exercises regularly without complaint of angina or claudication. We will check CBC to exclude anemia as a basis for exertional dyspnea and order CXR

## 2016-10-25 NOTE — Assessment & Plan Note (Signed)
Controlled, managed by cardiology

## 2016-10-25 NOTE — Progress Notes (Signed)
Subjective:    Patient ID: Levi [name], male    DOB: 22-Dec-1945, 71 y.o.   MRN: [id number] HPI  Male never smoker followed for allergy/asthma, rhinitis, history  ABPA, complicated by PAfib PFT- 01/04/2015-minimal restriction, minimal diffusion defect, insignificant response to bronchodilator. FVC 4.55/86%, FEV1 3.28/83%, FEV1/FVC 0.72, TLC 75%, DLCO 77 Office Spirometry 10/25/16-mild restriction of exhaled volume, minimal obstruction. FVC 4.04/78%, FEV1 2.71/71%, ratio 0.67, FEF 25-75% 1.68/58% ---------------------------------------------------------------------------------------------------.  04/26/2016-70 year old male never smoker followed for allergy/asthma, rhinitis, history ABPA, complicated by PAfib Allergy Vaccine 1:10 GH FOLLOWS FOR: no complaints on allergy vaccine.  Denies any breathing complaints today.  History is from out of his current allergy vaccine supply and we have agreed to stop allergy shots at this time for observation. Took an antibiotic and prednisone a few weeks ago for a bronchitis syndrome. Disliked Bevespi, said Qvar did not help, Advair and Symbicort blamed for cramps, Striverdi ok. We discussed anti-inflammatory maintenance inhalers compared with active bronchodilators. He uses rescue inhaler at least once most days. Little sleep disturbance.  10/25/16- 71 year old male never smoker followed for allergy/asthma, rhinitis, history ABPA, complicated by PAfib ASTHMA patient has had a few asthma attacks patient states that he is not having any problems today he has went to his heart doctor a several times not knowing if it was the asthma or his heart.  Stiverdi, Incruse He exercises regularly. Did have a lot of cough about 2 weeks ago, now gone. Hasn't started Incruse, thinking he should wait until he finished Qvar. I explained okayed overlap. He had not felt benefit from Trelegy sample. Breathing rarely wakes him. Describes episodes when he is aware of shortness of  breath, not necessarily exertional. Usually spells last a day or so and may correspond to weather. He needed a transfusion with hip surgery last year and has history of easy bruising but isn't aware of being anemic. Doesn't describe claudication. Office Spirometry 10/25/16-mild restriction of exhaled volume, minimal obstruction. FVC 4.04/78%, FEV1 2.71/71%, ratio 0.67, FEF 25-75% 1.68/58%  ROS-see HPI   Negative unless "+" Constitutional:    weight loss, night sweats, fevers, chills, fatigue, lassitude. HEENT:    headaches, difficulty swallowing, tooth/dental problems, sore throat,       sneezing, itching, ear ache, nasal congestion, post nasal drip, snoring CV:    chest pain, orthopnea, PND, swelling in lower extremities, anasarca,                                                    dizziness, palpitations Resp:   + shortness of breath with exertion or at rest.                productive cough,   non-productive cough, coughing up of blood.              change in color of mucus.  + wheezing.   Skin:    rash or lesions. GI:  No-   heartburn, indigestion, abdominal pain, nausea, vomiting, diarrhea,                 change in bowel habits, loss of appetite GU: dysuria, change in color of urine, no urgency or frequency.   flank pain. MS:   joint pain, stiffness, decreased range of motion, back pain. Neuro-     nothing unusual Psych:  change in mood  or affect.  depression or anxiety.   memory loss.    Objective:  OBJ- Physical Exam General- Alert, Oriented, Affect-appropriate, Distress- none acute, tall/thin Skin- rash-none, lesions- none, excoriation- none Lymphadenopathy- none Head- atraumatic            Eyes- Gross vision intact, PERRLA, conjunctivae and secretions clear            Ears- Hearing, canals-normal            Nose- Clear, no-Septal dev, mucus, polyps, erosion, perforation             Throat- Mallampati II , mucosa clear , drainage- none, tonsils- atrophic Neck- flexible , trachea  midline, no stridor , thyroid nl, carotid no bruit Chest - symmetrical excursion , unlabored           Heart/CV- RRR , no murmur , no gallop  , no rub, nl s1 s2                           - JVD- none , edema- none, stasis changes- none, varices+ Left calf           Lung- clear to P&A, wheeze- none, cough- none , dullness-none, rub- none           Chest wall-  Abd-  Br/ Gen/ Rectal- Not done, not indicated Extrem- cyanosis- none, clubbing, none, atrophy- none, strength- nl Neuro- grossly intact to observation    Assessment & Plan:

## 2016-10-28 ENCOUNTER — Telehealth: Payer: Self-pay | Admitting: Internal Medicine

## 2016-10-28 NOTE — Telephone Encounter (Signed)
Notes recorded by Deneise Lever, MD on 10/25/2016 at 3:14 PM EDT CXR- lungs are a little over-inflated, as seen with asthma. Heart is at upper limit of normal size, without heart failure. No new or active process.    Notes recorded by Deneise Lever, MD on 10/25/2016 at 3:08 PM EDT CBC- hemoglobin level is normal, so anemia is not a reason for shortness of breath  Spoke with the pt and notified of results/recs and he verbalized understanding

## 2016-10-29 DIAGNOSIS — M25571 Pain in right ankle and joints of right foot: Secondary | ICD-10-CM | POA: Diagnosis not present

## 2016-10-29 DIAGNOSIS — R2689 Other abnormalities of gait and mobility: Secondary | ICD-10-CM | POA: Diagnosis not present

## 2016-10-29 DIAGNOSIS — M6281 Muscle weakness (generalized): Secondary | ICD-10-CM | POA: Diagnosis not present

## 2016-10-31 DIAGNOSIS — M25571 Pain in right ankle and joints of right foot: Secondary | ICD-10-CM | POA: Diagnosis not present

## 2016-10-31 DIAGNOSIS — M6281 Muscle weakness (generalized): Secondary | ICD-10-CM | POA: Diagnosis not present

## 2016-10-31 DIAGNOSIS — R2689 Other abnormalities of gait and mobility: Secondary | ICD-10-CM | POA: Diagnosis not present

## 2016-11-01 ENCOUNTER — Telehealth: Payer: Self-pay | Admitting: Internal Medicine

## 2016-11-01 NOTE — Telephone Encounter (Signed)
Spoke with pt. States that he would like to switch back to Qvar because Trelegy is not controlling his symptoms. Pt is aware that CY has left for the day, he is fine with waiting until Monday.  CY - please advise. Thanks.

## 2016-11-04 MED ORDER — BECLOMETHASONE DIPROP HFA 40 MCG/ACT IN AERB: 2.0000 | INHALATION_SPRAY | Freq: Two times a day (BID) | RESPIRATORY_TRACT | 12 refills | Status: DC

## 2016-11-04 NOTE — Telephone Encounter (Signed)
Offer Qvar 40 RediHaler     Inhale 2 puffs, then rinse mouth, twice daily     Refill x 12

## 2016-11-04 NOTE — Telephone Encounter (Signed)
CY please advise on pt wishing to switch back to Qvar.  Thanks!

## 2016-11-04 NOTE — Telephone Encounter (Signed)
Left message for patient [facility] per patient request.

## 2016-11-05 DIAGNOSIS — R2689 Other abnormalities of gait and mobility: Secondary | ICD-10-CM | POA: Diagnosis not present

## 2016-11-05 DIAGNOSIS — M6281 Muscle weakness (generalized): Secondary | ICD-10-CM | POA: Diagnosis not present

## 2016-11-05 DIAGNOSIS — M25552 Pain in left hip: Secondary | ICD-10-CM | POA: Diagnosis not present

## 2016-11-05 DIAGNOSIS — M25571 Pain in right ankle and joints of right foot: Secondary | ICD-10-CM | POA: Diagnosis not present

## 2016-11-05 NOTE — Telephone Encounter (Signed)
Spoke with the pt's spouse and notified her that the qvar was sent  Nothing further needed

## 2016-11-07 DIAGNOSIS — R2689 Other abnormalities of gait and mobility: Secondary | ICD-10-CM | POA: Diagnosis not present

## 2016-11-07 DIAGNOSIS — M25552 Pain in left hip: Secondary | ICD-10-CM | POA: Diagnosis not present

## 2016-11-07 DIAGNOSIS — M25571 Pain in right ankle and joints of right foot: Secondary | ICD-10-CM | POA: Diagnosis not present

## 2016-11-07 DIAGNOSIS — M6281 Muscle weakness (generalized): Secondary | ICD-10-CM | POA: Diagnosis not present

## 2016-11-10 ENCOUNTER — Other Ambulatory Visit: Payer: Self-pay | Admitting: Internal Medicine

## 2016-11-12 DIAGNOSIS — R2689 Other abnormalities of gait and mobility: Secondary | ICD-10-CM | POA: Diagnosis not present

## 2016-11-12 DIAGNOSIS — M25571 Pain in right ankle and joints of right foot: Secondary | ICD-10-CM | POA: Diagnosis not present

## 2016-11-12 DIAGNOSIS — M25552 Pain in left hip: Secondary | ICD-10-CM | POA: Diagnosis not present

## 2016-11-12 DIAGNOSIS — M6281 Muscle weakness (generalized): Secondary | ICD-10-CM | POA: Diagnosis not present

## 2016-11-14 DIAGNOSIS — R2689 Other abnormalities of gait and mobility: Secondary | ICD-10-CM | POA: Diagnosis not present

## 2016-11-14 DIAGNOSIS — M6281 Muscle weakness (generalized): Secondary | ICD-10-CM | POA: Diagnosis not present

## 2016-11-14 DIAGNOSIS — M25571 Pain in right ankle and joints of right foot: Secondary | ICD-10-CM | POA: Diagnosis not present

## 2016-11-14 DIAGNOSIS — M25552 Pain in left hip: Secondary | ICD-10-CM | POA: Diagnosis not present

## 2016-11-18 DIAGNOSIS — B029 Zoster without complications: Secondary | ICD-10-CM | POA: Diagnosis not present

## 2016-11-20 DIAGNOSIS — R2689 Other abnormalities of gait and mobility: Secondary | ICD-10-CM | POA: Diagnosis not present

## 2016-11-20 DIAGNOSIS — M25552 Pain in left hip: Secondary | ICD-10-CM | POA: Diagnosis not present

## 2016-11-20 DIAGNOSIS — M6281 Muscle weakness (generalized): Secondary | ICD-10-CM | POA: Diagnosis not present

## 2016-11-20 DIAGNOSIS — M25571 Pain in right ankle and joints of right foot: Secondary | ICD-10-CM | POA: Diagnosis not present

## 2016-11-26 DIAGNOSIS — M6281 Muscle weakness (generalized): Secondary | ICD-10-CM | POA: Diagnosis not present

## 2016-11-26 DIAGNOSIS — M25571 Pain in right ankle and joints of right foot: Secondary | ICD-10-CM | POA: Diagnosis not present

## 2016-11-26 DIAGNOSIS — M25552 Pain in left hip: Secondary | ICD-10-CM | POA: Diagnosis not present

## 2016-11-26 DIAGNOSIS — R2689 Other abnormalities of gait and mobility: Secondary | ICD-10-CM | POA: Diagnosis not present

## 2016-12-03 DIAGNOSIS — R2689 Other abnormalities of gait and mobility: Secondary | ICD-10-CM | POA: Diagnosis not present

## 2016-12-03 DIAGNOSIS — M6281 Muscle weakness (generalized): Secondary | ICD-10-CM | POA: Diagnosis not present

## 2016-12-03 DIAGNOSIS — M25571 Pain in right ankle and joints of right foot: Secondary | ICD-10-CM | POA: Diagnosis not present

## 2016-12-03 DIAGNOSIS — B0223 Postherpetic polyneuropathy: Secondary | ICD-10-CM | POA: Diagnosis not present

## 2016-12-10 ENCOUNTER — Telehealth: Payer: Self-pay | Admitting: *Deleted

## 2016-12-10 NOTE — Telephone Encounter (Signed)
PA-override for quantity limit initiated on Pantoprazole 40 mg 1 tab twice daily. #[id number] Spoke with Christy Sartorius. Clinical form will be faxed on 7/18/.

## 2016-12-10 NOTE — Telephone Encounter (Signed)
Form received additional questions answered and faxed along with last office note and labs. Nothing further needed; pending approval.

## 2016-12-16 ENCOUNTER — Telehealth: Payer: Self-pay | Admitting: Internal Medicine

## 2016-12-16 NOTE — Telephone Encounter (Signed)
Pantoprazole 40 mg has been approved thru 12/15/17.

## 2016-12-17 DIAGNOSIS — M6281 Muscle weakness (generalized): Secondary | ICD-10-CM | POA: Diagnosis not present

## 2016-12-17 DIAGNOSIS — R2689 Other abnormalities of gait and mobility: Secondary | ICD-10-CM | POA: Diagnosis not present

## 2016-12-17 DIAGNOSIS — M25552 Pain in left hip: Secondary | ICD-10-CM | POA: Diagnosis not present

## 2016-12-17 DIAGNOSIS — M25571 Pain in right ankle and joints of right foot: Secondary | ICD-10-CM | POA: Diagnosis not present

## 2017-01-01 DIAGNOSIS — M5412 Radiculopathy, cervical region: Secondary | ICD-10-CM | POA: Diagnosis not present

## 2017-01-01 DIAGNOSIS — M4326 Fusion of spine, lumbar region: Secondary | ICD-10-CM | POA: Diagnosis not present

## 2017-01-01 DIAGNOSIS — M4722 Other spondylosis with radiculopathy, cervical region: Secondary | ICD-10-CM | POA: Diagnosis not present

## 2017-01-01 DIAGNOSIS — M47814 Spondylosis without myelopathy or radiculopathy, thoracic region: Secondary | ICD-10-CM | POA: Diagnosis not present

## 2017-01-02 DIAGNOSIS — M6281 Muscle weakness (generalized): Secondary | ICD-10-CM | POA: Diagnosis not present

## 2017-01-02 DIAGNOSIS — M546 Pain in thoracic spine: Secondary | ICD-10-CM | POA: Diagnosis not present

## 2017-01-02 DIAGNOSIS — M542 Cervicalgia: Secondary | ICD-10-CM | POA: Diagnosis not present

## 2017-01-02 DIAGNOSIS — M545 Low back pain: Secondary | ICD-10-CM | POA: Diagnosis not present

## 2017-01-06 DIAGNOSIS — M6281 Muscle weakness (generalized): Secondary | ICD-10-CM | POA: Diagnosis not present

## 2017-01-06 DIAGNOSIS — M542 Cervicalgia: Secondary | ICD-10-CM | POA: Diagnosis not present

## 2017-01-09 DIAGNOSIS — M542 Cervicalgia: Secondary | ICD-10-CM | POA: Diagnosis not present

## 2017-01-09 DIAGNOSIS — M6281 Muscle weakness (generalized): Secondary | ICD-10-CM | POA: Diagnosis not present

## 2017-01-09 DIAGNOSIS — M546 Pain in thoracic spine: Secondary | ICD-10-CM | POA: Diagnosis not present

## 2017-01-10 DIAGNOSIS — M4726 Other spondylosis with radiculopathy, lumbar region: Secondary | ICD-10-CM | POA: Diagnosis not present

## 2017-01-10 DIAGNOSIS — M4804 Spinal stenosis, thoracic region: Secondary | ICD-10-CM | POA: Diagnosis not present

## 2017-01-10 DIAGNOSIS — M5414 Radiculopathy, thoracic region: Secondary | ICD-10-CM | POA: Diagnosis not present

## 2017-01-10 DIAGNOSIS — M5134 Other intervertebral disc degeneration, thoracic region: Secondary | ICD-10-CM | POA: Diagnosis not present

## 2017-01-14 DIAGNOSIS — M546 Pain in thoracic spine: Secondary | ICD-10-CM | POA: Diagnosis not present

## 2017-01-14 DIAGNOSIS — M6281 Muscle weakness (generalized): Secondary | ICD-10-CM | POA: Diagnosis not present

## 2017-01-14 DIAGNOSIS — M542 Cervicalgia: Secondary | ICD-10-CM | POA: Diagnosis not present

## 2017-01-16 DIAGNOSIS — M6281 Muscle weakness (generalized): Secondary | ICD-10-CM | POA: Diagnosis not present

## 2017-01-16 DIAGNOSIS — M542 Cervicalgia: Secondary | ICD-10-CM | POA: Diagnosis not present

## 2017-01-16 DIAGNOSIS — M546 Pain in thoracic spine: Secondary | ICD-10-CM | POA: Diagnosis not present

## 2017-01-21 ENCOUNTER — Other Ambulatory Visit: Payer: Self-pay | Admitting: Internal Medicine

## 2017-01-21 DIAGNOSIS — M546 Pain in thoracic spine: Secondary | ICD-10-CM | POA: Diagnosis not present

## 2017-01-21 DIAGNOSIS — M542 Cervicalgia: Secondary | ICD-10-CM | POA: Diagnosis not present

## 2017-01-23 DIAGNOSIS — M542 Cervicalgia: Secondary | ICD-10-CM | POA: Diagnosis not present

## 2017-01-23 DIAGNOSIS — M546 Pain in thoracic spine: Secondary | ICD-10-CM | POA: Diagnosis not present

## 2017-01-28 DIAGNOSIS — M542 Cervicalgia: Secondary | ICD-10-CM | POA: Diagnosis not present

## 2017-01-28 DIAGNOSIS — M6281 Muscle weakness (generalized): Secondary | ICD-10-CM | POA: Diagnosis not present

## 2017-01-28 DIAGNOSIS — M546 Pain in thoracic spine: Secondary | ICD-10-CM | POA: Diagnosis not present

## 2017-01-30 ENCOUNTER — Encounter: Payer: Self-pay | Admitting: Internal Medicine

## 2017-01-30 ENCOUNTER — Ambulatory Visit (INDEPENDENT_AMBULATORY_CARE_PROVIDER_SITE_OTHER): Payer: Medicare Other | Admitting: Internal Medicine

## 2017-01-30 DIAGNOSIS — Z23 Encounter for immunization: Secondary | ICD-10-CM

## 2017-01-30 DIAGNOSIS — J4531 Mild persistent asthma with (acute) exacerbation: Secondary | ICD-10-CM

## 2017-01-30 MED ORDER — BECLOMETHASONE DIPROPIONATE 80 MCG/ACT IN AERS: INHALATION_SPRAY | RESPIRATORY_TRACT | 12 refills | Status: DC

## 2017-01-30 NOTE — Assessment & Plan Note (Signed)
There is mild obstructive airways disease seen mainly in small airways on spirometry. I'm not sure how much difference Qvar strength makes on top of his maintenance prednisone but we can try going back to Qvar 80. We consider him adrenal insufficient now based on long term systemic steroid use, and are not trying to wean him off of prednisone. Plan-change Qvar 40 to Qvar 80, flu shot

## 2017-01-30 NOTE — Patient Instructions (Signed)
Good luck with your back surgery  Script sent changing Qvar to the 80 strength  Flu shot- standard  Please call if we can help

## 2017-01-30 NOTE — Progress Notes (Signed)
Subjective:    Patient ID: Levi [name], male    DOB: [birth year], 71 y.o.   MRN: [id number] HPI  Male never smoker followed for allergy/asthma, rhinitis, history  ABPA, complicated by PAfib PFT- 01/04/2015-minimal restriction, minimal diffusion defect, insignificant response to bronchodilator. FVC 4.55/86%, FEV1 3.28/83%, FEV1/FVC 0.72, TLC 75%, DLCO 77 Office Spirometry 10/25/16-mild restriction of exhaled volume, minimal obstruction. FVC 4.04/78%, FEV1 2.71/71%, ratio 0.67, FEF 25-75% 1.68/58% Respiratory allergy profile 10/26/15- total IgE 60, minor elevations for dust mites and Aspergillus fumigatus ---------------------------------------------------------------------------------------------------.  10/25/16- 71 year old male never smoker followed for allergy/asthma, rhinitis, history ABPA, complicated by PAfib ASTHMA patient has had a few asthma attacks patient states that he is not having any problems today he has went to his heart doctor a several times not knowing if it was the asthma or his heart.  Stiverdi, Incruse He exercises regularly. Did have a lot of cough about 2 weeks ago, now gone. Hasn't started Incruse, thinking he should wait until he finished Qvar. I explained okayed overlap. He had not felt benefit from Trelegy sample. Breathing rarely wakes him. Describes episodes when he is aware of shortness of breath, not necessarily exertional. Usually spells last a day or so and may correspond to weather. He needed a transfusion with hip surgery last year and has history of easy bruising but isn't aware of being anemic. Doesn't describe claudication. Office Spirometry 10/25/16-mild restriction of exhaled volume, minimal obstruction. FVC 4.04/78%, FEV1 2.71/71%, ratio 0.67, FEF 25-75% 1.68/58%  01/30/17- 71 year old male never smoker followed for allergy/asthma, rhinitis, history ABPA, complicated by PAfib FOLLOWS FOR:Pt states he is havig a harder time breathing and using Rescue inhaler more  than usual. Pt is concerned that his Qvar inhaler was changed to 63mcg instead of the usual 55mcg-questions why.  Occasional chest tightness and wheeze with more need for rescue inhaler this summer-2 or 3 times daily. He remains on maintenance prednisone 10 mg daily-chronic. Continues Striverdi Respimat 2 puffs  daily maintenance. He hopes it going back to Qvar 80 would be enough to keep him stable. No acute events. Pending another thoracic laminectomy. CBC with differential 10/25/16- EOS WNL  CXR 10/25/16 IMPRESSION: Chronic pulmonary hyperinflation. No acute cardiopulmonary abnormality.  ROS-see HPI   Negative unless "+" Constitutional:    weight loss, night sweats, fevers, chills, fatigue, lassitude. HEENT:    headaches, difficulty swallowing, tooth/dental problems, sore throat,       sneezing, itching, ear ache, nasal congestion, post nasal drip, snoring CV:    chest pain, orthopnea, PND, swelling in lower extremities, anasarca,                                                    dizziness, palpitations Resp:   + shortness of breath with exertion or at rest.                productive cough,   non-productive cough, coughing up of blood.              change in color of mucus.  + wheezing.   Skin:    rash or lesions. GI:  No-   heartburn, indigestion, abdominal pain, nausea, vomiting, diarrhea,                 change in bowel habits, loss of appetite GU: dysuria, change in color of  urine, no urgency or frequency.   flank pain. MS:   joint pain, stiffness, decreased range of motion, back pain. Neuro-     nothing unusual Psych:  change in mood or affect.  depression or anxiety.   memory loss.    Objective:  OBJ- Physical Exam General- Alert, Oriented, Affect-appropriate, Distress- none acute, tall/thin Skin- rash-none, lesions- none, excoriation- none Lymphadenopathy- none Head- atraumatic            Eyes- Gross vision intact, PERRLA, conjunctivae and secretions clear            Ears-  Hearing, canals-normal            Nose- Clear, no-Septal dev, mucus, polyps, erosion, perforation             Throat- Mallampati II , mucosa clear , drainage- none, tonsils- atrophic Neck- flexible , trachea midline, no stridor , thyroid nl, carotid no bruit Chest - symmetrical excursion , unlabored           Heart/CV- RRR , no murmur , no gallop  , no rub, nl s1 s2                           - JVD- none , edema- none, stasis changes- none, varices+ Left calf           Lung- clear to P&A, wheeze- none, cough- none , dullness-none, rub- none           Chest wall-  Abd-  Br/ Gen/ Rectal- Not done, not indicated Extrem- cyanosis- none, clubbing, none, atrophy- none, strength- nl Neuro- grossly intact to observation    Assessment & Plan:

## 2017-02-04 DIAGNOSIS — M546 Pain in thoracic spine: Secondary | ICD-10-CM | POA: Diagnosis not present

## 2017-02-04 DIAGNOSIS — M542 Cervicalgia: Secondary | ICD-10-CM | POA: Diagnosis not present

## 2017-02-04 DIAGNOSIS — M6281 Muscle weakness (generalized): Secondary | ICD-10-CM | POA: Diagnosis not present

## 2017-02-05 DIAGNOSIS — M47814 Spondylosis without myelopathy or radiculopathy, thoracic region: Secondary | ICD-10-CM | POA: Diagnosis not present

## 2017-02-11 DIAGNOSIS — R972 Elevated prostate specific antigen [PSA]: Secondary | ICD-10-CM | POA: Diagnosis not present

## 2017-02-13 ENCOUNTER — Other Ambulatory Visit: Payer: Self-pay | Admitting: Neurological Surgery

## 2017-02-13 DIAGNOSIS — M47814 Spondylosis without myelopathy or radiculopathy, thoracic region: Secondary | ICD-10-CM

## 2017-02-14 ENCOUNTER — Other Ambulatory Visit: Payer: Self-pay | Admitting: Cardiology

## 2017-02-18 DIAGNOSIS — N5201 Erectile dysfunction due to arterial insufficiency: Secondary | ICD-10-CM | POA: Diagnosis not present

## 2017-02-18 DIAGNOSIS — Z125 Encounter for screening for malignant neoplasm of prostate: Secondary | ICD-10-CM | POA: Diagnosis not present

## 2017-02-26 ENCOUNTER — Ambulatory Visit
Admission: RE | Admit: 2017-02-26 | Discharge: 2017-02-26 | Disposition: A | Discharge status: Home / Self Care | Payer: Medicare Other | Source: Ambulatory Visit | Attending: Neurological Surgery | Admitting: Neurological Surgery

## 2017-02-26 ENCOUNTER — Other Ambulatory Visit: Payer: Self-pay | Admitting: Cardiology

## 2017-02-26 DIAGNOSIS — M47814 Spondylosis without myelopathy or radiculopathy, thoracic region: Secondary | ICD-10-CM

## 2017-02-26 DIAGNOSIS — M4804 Spinal stenosis, thoracic region: Secondary | ICD-10-CM | POA: Diagnosis not present

## 2017-02-27 DIAGNOSIS — M47814 Spondylosis without myelopathy or radiculopathy, thoracic region: Secondary | ICD-10-CM | POA: Diagnosis not present

## 2017-02-27 DIAGNOSIS — M4722 Other spondylosis with radiculopathy, cervical region: Secondary | ICD-10-CM | POA: Diagnosis not present

## 2017-02-27 DIAGNOSIS — I10 Essential (primary) hypertension: Secondary | ICD-10-CM | POA: Diagnosis not present

## 2017-03-04 ENCOUNTER — Other Ambulatory Visit: Payer: Self-pay | Admitting: Neurological Surgery

## 2017-03-05 ENCOUNTER — Telehealth: Payer: Self-pay | Admitting: *Deleted

## 2017-03-05 NOTE — Telephone Encounter (Signed)
   [facility] Pre-operative Risk Assessment    Request for surgical clearance:  1. What type of surgery is being performed? T12 to L1 Anterolateral Decompression/Fusion with a C6-7 Disc arthroplaty.   2. When is this surgery scheduled? 11.12.2018   3. Are there any medications that need to be held prior to surgery and how long?:Cardiac Clearance: N/A   4. Practice name and name of physician performing surgery? [facility] [name], MD  5. What is your office phone and fax number? Ph:[phone] Fx: [phone]  6. Anesthesia type (None, local, MAC, general) ? N/A   [place], [place] R 03/05/2017, 3:30 PM  _________________________________________________________________   (provider comments below)

## 2017-03-05 NOTE — Telephone Encounter (Signed)
No anticoagulation therapy per Dr Marlou Porch.   Only need cardiac clearance per PA/MD

## 2017-03-06 NOTE — Telephone Encounter (Signed)
Pt is in within 6 months of recent visit. Called to assess symptoms prior to procedure.

## 2017-03-07 NOTE — Telephone Encounter (Signed)
Levi [name] discussed w patient, as noted. Recommendations faxed electronically to requesting office at number provided.

## 2017-03-07 NOTE — Telephone Encounter (Signed)
°  Follow Up  Pt returning call from yesterday. Please call.

## 2017-03-07 NOTE — Telephone Encounter (Signed)
F/u Message   Pt returning call please call back to discuss

## 2017-03-07 NOTE — Telephone Encounter (Signed)
    Chart reviewed as part of pre-operative protocol coverage. Patient contaced 03/07/2017 in reference to pre-operative risk assessment for pending surgery as outlined below.  Levi [name] was last seen on 10/07/2016 by [name].  Since that day, Levi [name] has done well. Despite history of atrial fibrillation, he is not on any systemic anticoagulation. He is on aspirin. He had a cardiac cath in 2010 that showed only 20% left main, but otherwise no significant CAD. He has been able to exercise on elliptical twice a days, 4-5 days a week, without any chest pain or SOB. He has no exertional symptom climbing up 2 flights of stairs or walk 4 blocks around his home. He can complete at least 4 METS.  Therefore, based on ACC/AHA guidelines, the patient would be at acceptable risk for the planned procedure without further cardiovascular testing.   Only recommendation is to obtain a preop EKG prior to surgery to make sure he is not in afib with RVR.   [place], [place] 03/07/2017, 4:51 PM

## 2017-03-12 ENCOUNTER — Other Ambulatory Visit: Payer: Self-pay | Admitting: Internal Medicine

## 2017-03-22 ENCOUNTER — Other Ambulatory Visit: Payer: Self-pay | Admitting: Cardiology

## 2017-04-01 NOTE — Pre-Procedure Instructions (Signed)
[name]  04/01/2017      [id number] - [place], [place] - [address] [place]  [address] Phone: [phone] Fax: [phone]    Your procedure is scheduled on Mon. Nov. 12  Report to [facility] Admitting at 5:30 A.M.  Call this number if you have problems the morning of surgery:  [phone]   Remember:  Do not eat food or drink liquids after midnight on Sun. Nov. 11   Take these medicines the morning of surgery with A SIP OF WATER : tylenol if needed, albuterol if needed-bring to hospital, allopurinol (zyloprim), qvar -bring to hospital, diltiazem (cardizem), isosorbide (imdur), pantoprazole (protonix),prednisone (deltasone), striverdi-bring to hospital             7 days prior to surgery STOP taking any Aspirin (unless otherwise instructed by your surgeon), Aleve, Naproxen, Ibuprofen, Motrin, Advil, Goody's, BC's, all herbal medications, fish oil, and all vitamins   Do not wear jewelry.  Do not wear lotions, powders, or perfumes, or deoderant.  Do not shave 48 hours prior to surgery.  Men may shave face and neck.  Do not bring valuables to the hospital.  [facility] is not responsible for any belongings or valuables.  Contacts, dentures or bridgework may not be worn into surgery.  Leave your suitcase in the car.  After surgery it may be brought to your room.  For patients admitted to the hospital, discharge time will be determined by your treatment team.  Patients discharged the day of surgery will not be allowed to drive home.    Special instructions:  [facility]- Preparing For Surgery  Before surgery, you can play an important role. Because skin is not sterile, your skin needs to be as free of germs as possible. You can reduce the number of germs on your skin by washing with CHG (chlorahexidine gluconate) Soap before surgery.  CHG is an antiseptic cleaner which kills germs and bonds with the skin to  continue killing germs even after washing.  Please do not use if you have an allergy to CHG or antibacterial soaps. If your skin becomes reddened/irritated stop using the CHG.  Do not shave (including legs and underarms) for at least 48 hours prior to first CHG shower. It is OK to shave your face.  Please follow these instructions carefully.   1. Shower the NIGHT BEFORE SURGERY and the MORNING OF SURGERY with CHG.   2. If you chose to wash your hair, wash your hair first as usual with your normal shampoo.  3. After you shampoo, rinse your hair and body thoroughly to remove the shampoo.  4. Use CHG as you would any other liquid soap. You can apply CHG directly to the skin and wash gently with a scrungie or a clean washcloth.   5. Apply the CHG Soap to your body ONLY FROM THE NECK DOWN.  Do not use on open wounds or open sores. Avoid contact with your eyes, ears, mouth and genitals (private parts). Wash Face and genitals (private parts)  with your normal soap.  6. Wash thoroughly, paying special attention to the area where your surgery will be performed.  7. Thoroughly rinse your body with warm water from the neck down.  8. DO NOT shower/wash with your normal soap after using and rinsing off the CHG Soap.  9. Pat yourself dry with a CLEAN TOWEL.  10. Wear CLEAN PAJAMAS to  bed the night before surgery, wear comfortable clothes the morning of surgery  11. Place CLEAN SHEETS on your bed the night of your first shower and DO NOT SLEEP WITH PETS.    Day of Surgery: Do not apply any deodorants/lotions. Please wear clean clothes to the hospital/surgery center.      Please read over the following fact sheets that you were given. Coughing and Deep Breathing, MRSA Information and Surgical Site Infection Prevention

## 2017-04-02 ENCOUNTER — Encounter (HOSPITAL_COMMUNITY)
Admission: RE | Admit: 2017-04-02 | Discharge: 2017-04-02 | Disposition: A | Discharge status: Home / Self Care | Payer: Medicare Other | Source: Ambulatory Visit | Attending: Neurological Surgery | Admitting: Neurological Surgery

## 2017-04-02 ENCOUNTER — Encounter (HOSPITAL_COMMUNITY): Payer: Self-pay

## 2017-04-02 ENCOUNTER — Other Ambulatory Visit: Payer: Self-pay

## 2017-04-02 DIAGNOSIS — M4722 Other spondylosis with radiculopathy, cervical region: Secondary | ICD-10-CM | POA: Diagnosis not present

## 2017-04-02 DIAGNOSIS — M4714 Other spondylosis with myelopathy, thoracic region: Secondary | ICD-10-CM | POA: Diagnosis not present

## 2017-04-02 HISTORY — DX: Headache: R51

## 2017-04-02 HISTORY — DX: Headache, unspecified: R51.9

## 2017-04-02 LAB — SURGICAL PCR SCREEN
MRSA, PCR: NEGATIVE
Staphylococcus aureus: NEGATIVE

## 2017-04-02 LAB — BASIC METABOLIC PANEL
Anion gap: 7 (ref 5–15)
BUN: 23 mg/dL — ABNORMAL HIGH (ref 6–20)
CO2: 24 mmol/L (ref 22–32)
Calcium: 9.9 mg/dL (ref 8.9–10.3)
Chloride: 110 mmol/L (ref 101–111)
Creatinine, Ser: 0.99 mg/dL (ref 0.61–1.24)
GFR calc Af Amer: >60 mL/min (ref >60)
GFR calc non Af Amer: >60 mL/min (ref >60)
Glucose, Bld: 127 mg/dL — ABNORMAL HIGH (ref 65–99)
Potassium: 4 mmol/L (ref 3.5–5.1)
Sodium: 141 mmol/L (ref 135–145)

## 2017-04-02 LAB — CBC
HCT: 40.4 % (ref 39.0–52.0)
Hemoglobin: 13.4 g/dL (ref 13.0–17.0)
MCH: 31.4 pg (ref 26.0–34.0)
MCHC: 33.2 g/dL (ref 30.0–36.0)
MCV: 94.6 fL (ref 78.0–100.0)
Platelets: 244 10*3/uL (ref 150–400)
RBC: 4.27 MIL/uL (ref 4.22–5.81)
RDW: 14.9 % (ref 11.5–15.5)
WBC: 10.2 10*3/uL (ref 4.0–10.5)

## 2017-04-02 LAB — TYPE AND SCREEN
ABO/RH(D): O POS
Antibody Screen: NEGATIVE

## 2017-04-03 NOTE — Progress Notes (Signed)
Anesthesia Chart Review:  Pt is a 71 year old male scheduled for T12-L1 anterolateral decompression/fusion on 04/07/2017 with [name], MD.   - PCP is Orpah Melter, MD. - Pulmonologist is Baird Lyons, MD. Last office visit 01/30/17 - Cardiologist is Candee Furbish, MD, last office visit 10/07/16. Pt cleared for surgery 03/07/17 by Almyra Deforest, PA  PMH includes:  PAF, COPD, asthma, hyperlipidemia, GERD. Never smoker. BMI 21.  - S/p R THA 07/31/15. S/p R TKA 06/17/14. S/p PLIF 06/15/13. S/p ACDF 07/21/12. S/p shoulder arthroplasty 04/28/12. S/p TKA 01/10/12.   Medications include: Albuterol, ASA, lipitor, qvar, candesartan, diltiazem, imdur, protonix, prednisone, striverdi respimat  Preoperative labs reviewed.    CXR 10/25/16:  - Chronic pulmonary hyperinflation. No acute cardiopulmonary abnormality.  EKG 10/07/16: NSR.   PFTs 10/25/16: mild restriction. FVC 4.04/78%, FEV1 2.71/71%, FEV1/FVC 67%  Echo 05/10/12:  - Left ventricle: The cavity size was normal. Systolic function was mildly to moderately reduced. The estimated ejection fraction was in the range of 40% to 50%. Wallmotion was normal; there were no regional wall motionabnormalities. - Mitral valve: Mild regurgitation. - Pulmonary arteries: Systolic pressure was mildlyincreased. PA peak pressure: 53mm Hg (S). - Impressions: When compared to 2010 study, EF is slightly reduced (previously in the 50% range), however NUC stress 2010 demonstrated EF of 43%. Prior cath with no flow limiting CAD.  Cardiac cath 11/25/08:  1. Calcified ostial left anterior descending lesion of up to 20% stenosis, which is not flow limiting. Circumflex artery - In the second obtuse marginal branch, which is a vessel less than 1 mm, there is mid stenosis of 75%. This vessel was too small for any intervention. Otherwise no angiographically significant coronary artery disease.  2. Left ventricular ejection fraction of approximately 50% with mildly dilated left  ventricle and apical hypokinesis noted.  3. No abdominal aortic aneurysm. No renal artery stenosis.  If no changes, I anticipate pt can proceed with surgery as scheduled.   Willeen Cass, FNP-BC [facility]/Anesthesiology Phone: [phone] 04/03/2017 12:28 PM

## 2017-04-04 ENCOUNTER — Encounter: Payer: Self-pay | Admitting: Cardiology

## 2017-04-06 NOTE — H&P (Signed)
Chief Comlaint: Back pain and Neck pain with radiculopathy  HPI:  Levi [name] is a patient who has had severe spondylosis in his back and his neck. He has had previous surgery including a fusion from L2 to [place] and now has adjacent level disease which will need decompression and fusion. He also has cervical spondylosis with myelopathy which will require anterior decompression and arthroplasty.  Past Medical History:  Diagnosis Date  . Agent orange exposure 1970's   takes Imdur,Diltiazem, and Atacand daily  . ALLERGIC RHINITIS   . Asthma   . Bruises easily    d/t prednisone daily  . Chronic bronchitis ([place])    "used to get it q yr; last time ?2008" (04/28/2012)  . COPD (chronic obstructive pulmonary disease) ([facility])    agent orange exposure  . Degenerative disk disease    "qwhere" (04/28/2012)  . Diverticulitis   . Dysrhythmia    afib  . Elevated uric acid in blood    takes Allopurinol daily  . Enlarged prostate    but not on any meds  . GERD (gastroesophageal reflux disease)    takes Protonix daily  . H/O hiatal hernia   . History of pneumonia    a. 2010  . Hyperlipidemia    takes Lipitor daily; pt. states he takes a preventive  . Joint pain   . Joint swelling   . PAF (paroxysmal atrial fibrillation) ([place])    a. Dx 04/2012, CHA2DS2VASc = 1 (age);  b. 04/2012 Echo: EF 40-50%, mild MR.  . Personal history of colonic adenomas 02/09/2013  . Shortness of breath dyspnea          Past Surgical History:  Procedure Laterality Date  . ANTERIOR CERVICAL DECOMP/DISCECTOMY FUSION N/A 07/21/2012   Procedure: ANTERIOR CERVICAL DECOMPRESSION/DISCECTOMY FUSION 2 LEVELS;  Surgeon: [name], MD;  Location: [facility];  Service: Neurosurgery;  Laterality: N/A;  C4-5 C5-6 Anterior cervical decompression/diskectomy/fusion  . BACK SURGERY     x 3  . CARDIAC CATHETERIZATION  11/2008   [name] - 20% calcified non flow limiting left main, 50% EF apical  hypokinesis  . CHOLECYSTECTOMY    . COLONOSCOPY    . ESOPHAGOGASTRODUODENOSCOPY    . EYE SURGERY Bilateral 2011   steroidal encapsulation" (04/28/2012)  . FOOT SURGERY Right    x 2  . KNEE ARTHROSCOPY Bilateral   . LATERAL / POSTERIOR COMBINED FUSION LUMBAR SPINE  2012  . REVERSE SHOULDER ARTHROPLASTY  04/28/2012   Procedure: REVERSE SHOULDER ARTHROPLASTY;  Surgeon: [name], MD;  Location: [facility];  Service: Orthopedics;  Laterality: Left;  Left shouder reverse total shoulder arthroplasty  . SHOULDER SURGERY    . [place]  . TOTAL HIP ARTHROPLASTY Left 2011   "left" (04/28/2012)  . TOTAL HIP ARTHROPLASTY Right 07/31/2015   Procedure: TOTAL HIP ARTHROPLASTY ANTERIOR APPROACH;  Surgeon: [name], MD;  Location: [facility];  Service: Orthopedics;  Laterality: Right;  . TOTAL KNEE ARTHROPLASTY  01/10/2012   Procedure: TOTAL KNEE ARTHROPLASTY;  Surgeon: [name], MD;  Location: [facility];  Service: Orthopedics;;  left total knee arthroplasty  . TOTAL KNEE ARTHROPLASTY Right 06/17/2014   Procedure: TOTAL KNEE ARTHROPLASTY;  Surgeon: [name], MD;  Location: [facility];  Service: Orthopedics;  Laterality: Right;          Current Outpatient Prescriptions  Medication Sig Dispense Refill  . albuterol (PROVENTIL HFA) 108 (90 Base) MCG/ACT inhaler INHALE 2 PUFFS INTO THE LUNGS EVERY  4 (FOUR) HOURS AS NEEDED FOR WHEEZING. 6.7 each 12  . allopurinol (ZYLOPRIM) 100 MG tablet Take 100 mg by mouth daily.      [name] aspirin EC 81 MG tablet Take 1 tablet (81 mg total) by mouth daily. 90 tablet 3  . atorvastatin (LIPITOR) 10 MG tablet Take 1 tablet (10 mg total) by mouth daily. 90 tablet 3  . candesartan (ATACAND) 4 MG tablet TAKE 1/2 TABLETS (2 MG TOTAL) BY MOUTH DAILY. 15 tablet 5  . diltiazem (CARDIZEM CD) 180 MG 24 hr capsule TAKE 1 CAPSULE (180 MG TOTAL) BY MOUTH DAILY. 30 capsule 7  . diltiazem (CARDIZEM) 30 MG tablet Take 1 tablet every 4  hours AS NEEDED for afib HR >100 and BP >100 30 tablet 0  . Fluticasone-Umeclidin-Vilant (TRELEGY ELLIPTA) 100-62.5-25 MCG/INH AEPB Inhale 1 puff into the lungs daily. 1 each inhaler  . Fluticasone-Umeclidin-Vilant (TRELEGY ELLIPTA) 100-62.5-25 MCG/INH AEPB Inhale 1 puff into the lungs daily. 1 each 0  . isosorbide mononitrate (IMDUR) 30 MG 24 hr tablet TAKE 1 TABLET (30 MG TOTAL) BY MOUTH DAILY. 30 tablet 5  . pantoprazole (PROTONIX) 40 MG tablet TAKE 1 TABLET BY MOUTH TWICE A DAY 60 tablet 11  . predniSONE (DELTASONE) 10 MG tablet TAKE 1 TABLET (10 MG TOTAL) BY MOUTH DAILY. CONTINUOUS 30 tablet 11  . STRIVERDI RESPIMAT 2.5 MCG/ACT AERS INHALE 2 PUFFS INTO THE LUNGS DAILY. 1 Inhaler 6  . tamsulosin (FLOMAX) 0.4 MG CAPS capsule Take 0.4 mg by mouth at bedtime.  11  . umeclidinium bromide (INCRUSE ELLIPTA) 62.5 MCG/INH AEPB Inhale 1 puff into the lungs daily. 1 each 12   No current facility-administered medications for this visit.     Allergies:         Allergies  Allergen Reactions  . Morphine And Related Palpitations    Light headed and sweaty; "my heart races" (04/28/2012), pt states too much morphine gave him this reaction  . Levaquin [Levofloxacin] Itching    Social History:  The patient  reports that he has never smoked. He has never used smokeless tobacco. He reports that he does not drink alcohol or use drugs.   ROS:  Please see the history of present illness.      Impression:  He had an MRI of his thoracic spine.  The thoracic spine for the largest portion of it demonstrates that he has an amply patent thoracic canal and no evidence of any compression of his neural elements.  The most prominent feature is the degenerative change that he has at T12-L1, and I believe that this area is a focus of his substantial thoracic and thoracolumbar back pain.  We discussed with him today a consideration of a stabilization procedure for that area, and it would be best to do this via an  anterolateral decompression using an XLIF device.  This can be done through a single-stage through the side.  I believe it should provide some stability for his thoracolumbar junction.  It is likely the bone on bone that is aggravating such tremendous pain for him.  The adjacent levels to that appear to be fairly healthy.  In addition, Mohab tells me that he has been having worsening cervical radiculopathy, and on the upper end of the thoracic MRI, we can see that C6-C7 which had evidence of some moderate spondylitic change on an MRI last year in November has actually worsened with an even larger disc protrusion and primarily left-sided foraminal stenosis.  I noted to him  that the thoracic MRI is focusing on this area only on the scout views, but it is clear that those spondylitic changes are significantly worse compared to what they were on the MRI.  In that regard, he would want something definitive done about his cervical spine.  I had previously discussed consideration of an arthroplasty at the C6-C7 level.  We can schedule this to be done at the same time as his lateral operation, and this will allow him to recover from both these areas simultaneously.  He is keen on having this done in a single stage if at all possible and will plan on scheduling this at his earliest convenience.

## 2017-04-07 ENCOUNTER — Inpatient Hospital Stay (HOSPITAL_COMMUNITY): Payer: Medicare Other

## 2017-04-07 ENCOUNTER — Inpatient Hospital Stay (HOSPITAL_COMMUNITY): Payer: Medicare Other | Admitting: Emergency Medicine

## 2017-04-07 ENCOUNTER — Inpatient Hospital Stay (HOSPITAL_COMMUNITY): Payer: Medicare Other | Admitting: Anesthesiology

## 2017-04-07 ENCOUNTER — Inpatient Hospital Stay (HOSPITAL_COMMUNITY)
Admission: RE | Admit: 2017-04-07 | Discharge: 2017-04-09 | DRG: 460 | Disposition: A | Discharge status: Home / Self Care | Payer: Medicare Other | Source: Ambulatory Visit | Attending: Neurological Surgery | Admitting: Neurological Surgery

## 2017-04-07 ENCOUNTER — Encounter (HOSPITAL_COMMUNITY)
Admission: RE | Disposition: A | Discharge status: Home / Self Care | Payer: Self-pay | Source: Ambulatory Visit | Attending: Neurological Surgery

## 2017-04-07 ENCOUNTER — Encounter (HOSPITAL_COMMUNITY): Payer: Self-pay | Admitting: Urology

## 2017-04-07 DIAGNOSIS — I4891 Unspecified atrial fibrillation: Secondary | ICD-10-CM | POA: Diagnosis not present

## 2017-04-07 DIAGNOSIS — Z7982 Long term (current) use of aspirin: Secondary | ICD-10-CM | POA: Diagnosis not present

## 2017-04-07 DIAGNOSIS — M4712 Other spondylosis with myelopathy, cervical region: Secondary | ICD-10-CM | POA: Diagnosis not present

## 2017-04-07 DIAGNOSIS — Z7952 Long term (current) use of systemic steroids: Secondary | ICD-10-CM

## 2017-04-07 DIAGNOSIS — K219 Gastro-esophageal reflux disease without esophagitis: Secondary | ICD-10-CM | POA: Diagnosis present

## 2017-04-07 DIAGNOSIS — Z96653 Presence of artificial knee joint, bilateral: Secondary | ICD-10-CM | POA: Diagnosis present

## 2017-04-07 DIAGNOSIS — I480 Paroxysmal atrial fibrillation: Secondary | ICD-10-CM | POA: Diagnosis present

## 2017-04-07 DIAGNOSIS — J449 Chronic obstructive pulmonary disease, unspecified: Secondary | ICD-10-CM | POA: Diagnosis not present

## 2017-04-07 DIAGNOSIS — M4722 Other spondylosis with radiculopathy, cervical region: Secondary | ICD-10-CM | POA: Diagnosis not present

## 2017-04-07 DIAGNOSIS — Z96612 Presence of left artificial shoulder joint: Secondary | ICD-10-CM | POA: Diagnosis present

## 2017-04-07 DIAGNOSIS — J95811 Postprocedural pneumothorax: Secondary | ICD-10-CM | POA: Diagnosis not present

## 2017-04-07 DIAGNOSIS — M48062 Spinal stenosis, lumbar region with neurogenic claudication: Secondary | ICD-10-CM

## 2017-04-07 DIAGNOSIS — Z8701 Personal history of pneumonia (recurrent): Secondary | ICD-10-CM | POA: Diagnosis not present

## 2017-04-07 DIAGNOSIS — Z7951 Long term (current) use of inhaled steroids: Secondary | ICD-10-CM | POA: Diagnosis not present

## 2017-04-07 DIAGNOSIS — J939 Pneumothorax, unspecified: Secondary | ICD-10-CM

## 2017-04-07 DIAGNOSIS — Z77098 Contact with and (suspected) exposure to other hazardous, chiefly nonmedicinal, chemicals: Secondary | ICD-10-CM | POA: Diagnosis present

## 2017-04-07 DIAGNOSIS — Z96643 Presence of artificial hip joint, bilateral: Secondary | ICD-10-CM | POA: Diagnosis present

## 2017-04-07 DIAGNOSIS — N400 Enlarged prostate without lower urinary tract symptoms: Secondary | ICD-10-CM | POA: Diagnosis present

## 2017-04-07 DIAGNOSIS — E785 Hyperlipidemia, unspecified: Secondary | ICD-10-CM | POA: Diagnosis not present

## 2017-04-07 DIAGNOSIS — Z09 Encounter for follow-up examination after completed treatment for conditions other than malignant neoplasm: Secondary | ICD-10-CM

## 2017-04-07 DIAGNOSIS — Z419 Encounter for procedure for purposes other than remedying health state, unspecified: Secondary | ICD-10-CM

## 2017-04-07 DIAGNOSIS — G8929 Other chronic pain: Secondary | ICD-10-CM | POA: Diagnosis not present

## 2017-04-07 DIAGNOSIS — Z981 Arthrodesis status: Secondary | ICD-10-CM

## 2017-04-07 DIAGNOSIS — R0602 Shortness of breath: Secondary | ICD-10-CM

## 2017-04-07 DIAGNOSIS — M47815 Spondylosis without myelopathy or radiculopathy, thoracolumbar region: Secondary | ICD-10-CM | POA: Diagnosis not present

## 2017-04-07 DIAGNOSIS — M549 Dorsalgia, unspecified: Secondary | ICD-10-CM | POA: Diagnosis not present

## 2017-04-07 DIAGNOSIS — M47814 Spondylosis without myelopathy or radiculopathy, thoracic region: Secondary | ICD-10-CM | POA: Diagnosis not present

## 2017-04-07 DIAGNOSIS — Z9049 Acquired absence of other specified parts of digestive tract: Secondary | ICD-10-CM

## 2017-04-07 DIAGNOSIS — M4725 Other spondylosis with radiculopathy, thoracolumbar region: Secondary | ICD-10-CM | POA: Diagnosis not present

## 2017-04-07 HISTORY — DX: Spinal stenosis, lumbar region with neurogenic claudication: M48.062

## 2017-04-07 HISTORY — PX: CERVICAL DISC ARTHROPLASTY: SHX587

## 2017-04-07 HISTORY — PX: ANTERIOR LAT LUMBAR FUSION: SHX1168

## 2017-04-07 SURGERY — ANTERIOR LATERAL LUMBAR FUSION 1 LEVEL
Anesthesia: General | Site: Spine Cervical

## 2017-04-07 MED ORDER — DEXAMETHASONE SODIUM PHOSPHATE 10 MG/ML IJ SOLN
INTRAMUSCULAR | Status: AC
  Filled 2017-04-07: qty 3

## 2017-04-07 MED ORDER — MENTHOL 3 MG MT LOZG: 1.0000 | LOZENGE | OROMUCOSAL | Status: DC | PRN

## 2017-04-07 MED ORDER — DILTIAZEM HCL ER COATED BEADS 180 MG PO CP24
180.0000 mg | ORAL_CAPSULE | Freq: Every day | ORAL | Status: DC
  Administered 2017-04-08: 180 mg via ORAL
  Filled 2017-04-07 (×2): qty 1

## 2017-04-07 MED ORDER — ONDANSETRON HCL 4 MG/2ML IJ SOLN: 4.0000 mg | Freq: Once | INTRAMUSCULAR | Status: DC | PRN

## 2017-04-07 MED ORDER — IRBESARTAN 75 MG PO TABS
37.5000 mg | ORAL_TABLET | Freq: Every day | ORAL | Status: DC
  Administered 2017-04-07 – 2017-04-08 (×2): 37.5 mg via ORAL
  Filled 2017-04-07 (×3): qty 0.5

## 2017-04-07 MED ORDER — PHENOL 1.4 % MT LIQD: 1.0000 | OROMUCOSAL | Status: DC | PRN

## 2017-04-07 MED ORDER — DILTIAZEM HCL 30 MG PO TABS
30.0000 mg | ORAL_TABLET | ORAL | Status: DC | PRN
  Filled 2017-04-07: qty 1

## 2017-04-07 MED ORDER — CHLORHEXIDINE GLUCONATE CLOTH 2 % EX PADS: 6.0000 | MEDICATED_PAD | Freq: Once | CUTANEOUS | Status: DC

## 2017-04-07 MED ORDER — NEOSTIGMINE METHYLSULFATE 5 MG/5ML IV SOSY
PREFILLED_SYRINGE | INTRAVENOUS | Status: AC
  Filled 2017-04-07: qty 5

## 2017-04-07 MED ORDER — GLYCOPYRROLATE 0.2 MG/ML IJ SOLN
INTRAMUSCULAR | Status: DC | PRN
  Administered 2017-04-07: 0.2 mg via INTRAVENOUS

## 2017-04-07 MED ORDER — ONDANSETRON HCL 4 MG/2ML IJ SOLN: 4.0000 mg | Freq: Four times a day (QID) | INTRAMUSCULAR | Status: DC | PRN

## 2017-04-07 MED ORDER — FLEET ENEMA 7-19 GM/118ML RE ENEM: 1.0000 | ENEMA | Freq: Once | RECTAL | Status: DC | PRN

## 2017-04-07 MED ORDER — LACTATED RINGERS IV SOLN
INTRAVENOUS | Status: DC | PRN
  Administered 2017-04-07 (×2): via INTRAVENOUS

## 2017-04-07 MED ORDER — MEPERIDINE HCL 25 MG/ML IJ SOLN: 6.2500 mg | INTRAMUSCULAR | Status: DC | PRN

## 2017-04-07 MED ORDER — FENTANYL CITRATE (PF) 100 MCG/2ML IJ SOLN
INTRAMUSCULAR | Status: DC | PRN
  Administered 2017-04-07: 100 ug via INTRAVENOUS
  Administered 2017-04-07: 250 ug via INTRAVENOUS
  Administered 2017-04-07: 50 ug via INTRAVENOUS

## 2017-04-07 MED ORDER — LIDOCAINE 2% (20 MG/ML) 5 ML SYRINGE
INTRAMUSCULAR | Status: AC
  Filled 2017-04-07: qty 10

## 2017-04-07 MED ORDER — BUPIVACAINE HCL (PF) 0.25 % IJ SOLN
INTRAMUSCULAR | Status: DC | PRN
  Administered 2017-04-07: 9 mL

## 2017-04-07 MED ORDER — CEFAZOLIN SODIUM-DEXTROSE 2-4 GM/100ML-% IV SOLN
2.0000 g | INTRAVENOUS | Status: AC
Start: 2017-04-07 — End: 2017-04-07
  Administered 2017-04-07: 2 g via INTRAVENOUS
  Filled 2017-04-07: qty 100

## 2017-04-07 MED ORDER — DIAZEPAM 5 MG PO TABS
5.0000 mg | ORAL_TABLET | Freq: Four times a day (QID) | ORAL | Status: DC | PRN
  Administered 2017-04-07: 5 mg via ORAL
  Filled 2017-04-07: qty 1

## 2017-04-07 MED ORDER — FENTANYL CITRATE (PF) 250 MCG/5ML IJ SOLN
INTRAMUSCULAR | Status: AC
  Filled 2017-04-07: qty 5

## 2017-04-07 MED ORDER — SENNA 8.6 MG PO TABS
1.0000 | ORAL_TABLET | Freq: Two times a day (BID) | ORAL | Status: DC
  Administered 2017-04-07 – 2017-04-08 (×3): 8.6 mg via ORAL
  Filled 2017-04-07 (×4): qty 1

## 2017-04-07 MED ORDER — CEFAZOLIN SODIUM-DEXTROSE 2-4 GM/100ML-% IV SOLN
2.0000 g | Freq: Three times a day (TID) | INTRAVENOUS | Status: AC
  Administered 2017-04-07 (×2): 2 g via INTRAVENOUS
  Filled 2017-04-07 (×2): qty 100

## 2017-04-07 MED ORDER — ALBUTEROL SULFATE (2.5 MG/3ML) 0.083% IN NEBU: 2.5000 mg | INHALATION_SOLUTION | RESPIRATORY_TRACT | Status: DC | PRN

## 2017-04-07 MED ORDER — CYCLOBENZAPRINE HCL 10 MG PO TABS
10.0000 mg | ORAL_TABLET | Freq: Three times a day (TID) | ORAL | Status: DC | PRN
  Administered 2017-04-07 – 2017-04-08 (×2): 10 mg via ORAL
  Filled 2017-04-07: qty 1

## 2017-04-07 MED ORDER — LIDOCAINE-EPINEPHRINE 1 %-1:100000 IJ SOLN
INTRAMUSCULAR | Status: AC
  Filled 2017-04-07: qty 1

## 2017-04-07 MED ORDER — LIDOCAINE HCL (CARDIAC) 20 MG/ML IV SOLN
INTRAVENOUS | Status: DC | PRN
  Administered 2017-04-07: 80 mg via INTRAVENOUS

## 2017-04-07 MED ORDER — PREDNISONE 10 MG PO TABS
10.0000 mg | ORAL_TABLET | Freq: Every day | ORAL | Status: DC
Start: 2017-04-08 — End: 2017-04-09
  Administered 2017-04-08: 10 mg via ORAL
  Filled 2017-04-07 (×2): qty 1

## 2017-04-07 MED ORDER — SUCCINYLCHOLINE CHLORIDE 20 MG/ML IJ SOLN
INTRAMUSCULAR | Status: DC | PRN
  Administered 2017-04-07: 120 mg via INTRAVENOUS

## 2017-04-07 MED ORDER — LACTATED RINGERS IV SOLN
INTRAVENOUS | Status: DC
  Administered 2017-04-07: 15:00:00 via INTRAVENOUS

## 2017-04-07 MED ORDER — ACETAMINOPHEN 650 MG RE SUPP: 650.0000 mg | RECTAL | Status: DC | PRN

## 2017-04-07 MED ORDER — BUPIVACAINE HCL (PF) 0.5 % IJ SOLN
INTRAMUSCULAR | Status: DC | PRN
  Administered 2017-04-07: 5 mL

## 2017-04-07 MED ORDER — 0.9 % SODIUM CHLORIDE (POUR BTL) OPTIME
TOPICAL | Status: DC | PRN
  Administered 2017-04-07: 1000 mL

## 2017-04-07 MED ORDER — THROMBIN (RECOMBINANT) 5000 UNITS EX SOLR
OROMUCOSAL | Status: DC | PRN
  Administered 2017-04-07: 5 mL via TOPICAL

## 2017-04-07 MED ORDER — DOCUSATE SODIUM 100 MG PO CAPS
100.0000 mg | ORAL_CAPSULE | Freq: Two times a day (BID) | ORAL | Status: DC
  Administered 2017-04-07 – 2017-04-08 (×4): 100 mg via ORAL
  Filled 2017-04-07 (×5): qty 1

## 2017-04-07 MED ORDER — HYDROMORPHONE HCL 1 MG/ML IJ SOLN
INTRAMUSCULAR | Status: AC
  Filled 2017-04-07: qty 1

## 2017-04-07 MED ORDER — BUPIVACAINE HCL (PF) 0.25 % IJ SOLN
INTRAMUSCULAR | Status: AC
  Filled 2017-04-07: qty 30

## 2017-04-07 MED ORDER — BUPIVACAINE HCL (PF) 0.5 % IJ SOLN
INTRAMUSCULAR | Status: AC
  Filled 2017-04-07: qty 30

## 2017-04-07 MED ORDER — SUGAMMADEX SODIUM 200 MG/2ML IV SOLN
INTRAVENOUS | Status: DC | PRN
  Administered 2017-04-07: 200 mg via INTRAVENOUS

## 2017-04-07 MED ORDER — PROPOFOL 10 MG/ML IV BOLUS
INTRAVENOUS | Status: DC | PRN
  Administered 2017-04-07: 130 mg via INTRAVENOUS

## 2017-04-07 MED ORDER — ISOSORBIDE MONONITRATE ER 30 MG PO TB24
30.0000 mg | ORAL_TABLET | Freq: Every day | ORAL | Status: DC
  Administered 2017-04-08: 30 mg via ORAL
  Filled 2017-04-07 (×2): qty 1

## 2017-04-07 MED ORDER — MIDAZOLAM HCL 2 MG/2ML IJ SOLN
INTRAMUSCULAR | Status: AC
  Filled 2017-04-07: qty 2

## 2017-04-07 MED ORDER — EPHEDRINE SULFATE 50 MG/ML IJ SOLN
INTRAMUSCULAR | Status: DC | PRN
  Administered 2017-04-07 (×2): 10 mg via INTRAVENOUS

## 2017-04-07 MED ORDER — ONDANSETRON HCL 4 MG PO TABS: 4.0000 mg | ORAL_TABLET | Freq: Four times a day (QID) | ORAL | Status: DC | PRN

## 2017-04-07 MED ORDER — BISACODYL 10 MG RE SUPP: 10.0000 mg | Freq: Every day | RECTAL | Status: DC | PRN

## 2017-04-07 MED ORDER — ALUM & MAG HYDROXIDE-SIMETH 200-200-20 MG/5ML PO SUSP: 30.0000 mL | Freq: Four times a day (QID) | ORAL | Status: DC | PRN

## 2017-04-07 MED ORDER — CEFAZOLIN SODIUM 1 G IJ SOLR
INTRAMUSCULAR | Status: AC
  Filled 2017-04-07: qty 10

## 2017-04-07 MED ORDER — ONDANSETRON HCL 4 MG/2ML IJ SOLN
INTRAMUSCULAR | Status: AC
  Filled 2017-04-07: qty 6

## 2017-04-07 MED ORDER — SUCCINYLCHOLINE CHLORIDE 200 MG/10ML IV SOSY
PREFILLED_SYRINGE | INTRAVENOUS | Status: AC
  Filled 2017-04-07: qty 20

## 2017-04-07 MED ORDER — ATORVASTATIN CALCIUM 20 MG PO TABS
10.0000 mg | ORAL_TABLET | Freq: Every day | ORAL | Status: DC
Start: 2017-04-08 — End: 2017-04-09
  Administered 2017-04-08: 10 mg via ORAL
  Filled 2017-04-07 (×2): qty 1

## 2017-04-07 MED ORDER — DEXAMETHASONE SODIUM PHOSPHATE 10 MG/ML IJ SOLN
INTRAMUSCULAR | Status: DC | PRN
  Administered 2017-04-07: 10 mg via INTRAVENOUS

## 2017-04-07 MED ORDER — ROCURONIUM BROMIDE 10 MG/ML (PF) SYRINGE
PREFILLED_SYRINGE | INTRAVENOUS | Status: AC
  Filled 2017-04-07: qty 10

## 2017-04-07 MED ORDER — CYCLOBENZAPRINE HCL 5 MG PO TABS
5.0000 mg | ORAL_TABLET | Freq: Three times a day (TID) | ORAL | Status: DC | PRN
  Administered 2017-04-07: 5 mg via ORAL
  Filled 2017-04-07: qty 1

## 2017-04-07 MED ORDER — LIDOCAINE-EPINEPHRINE 1 %-1:100000 IJ SOLN
INTRAMUSCULAR | Status: DC | PRN
Start: 2017-04-07 — End: 2017-04-07
  Administered 2017-04-07: 5 mL

## 2017-04-07 MED ORDER — HYDROMORPHONE HCL 1 MG/ML IJ SOLN
0.2500 mg | INTRAMUSCULAR | Status: DC | PRN
  Administered 2017-04-07 (×2): 0.5 mg via INTRAVENOUS

## 2017-04-07 MED ORDER — ACETAMINOPHEN 325 MG PO TABS: 650.0000 mg | ORAL_TABLET | ORAL | Status: DC | PRN

## 2017-04-07 MED ORDER — OXYCODONE-ACETAMINOPHEN 5-325 MG PO TABS
1.0000 | ORAL_TABLET | ORAL | Status: DC | PRN
  Administered 2017-04-07 – 2017-04-08 (×3): 2 via ORAL
  Filled 2017-04-07 (×4): qty 2

## 2017-04-07 MED ORDER — METHOCARBAMOL 500 MG PO TABS: 500.0000 mg | ORAL_TABLET | Freq: Four times a day (QID) | ORAL | Status: DC | PRN

## 2017-04-07 MED ORDER — MIDAZOLAM HCL 5 MG/5ML IJ SOLN
INTRAMUSCULAR | Status: DC | PRN
  Administered 2017-04-07: 2 mg via INTRAVENOUS

## 2017-04-07 MED ORDER — ROCURONIUM BROMIDE 100 MG/10ML IV SOLN
INTRAVENOUS | Status: DC | PRN
Start: 2017-04-07 — End: 2017-04-07
  Administered 2017-04-07: 40 mg via INTRAVENOUS

## 2017-04-07 MED ORDER — PHENYLEPHRINE HCL 10 MG/ML IJ SOLN
INTRAMUSCULAR | Status: DC | PRN
  Administered 2017-04-07: 30 ug/min via INTRAVENOUS

## 2017-04-07 MED ORDER — ONDANSETRON HCL 4 MG/2ML IJ SOLN
INTRAMUSCULAR | Status: DC | PRN
  Administered 2017-04-07 (×2): 4 mg via INTRAVENOUS

## 2017-04-07 MED ORDER — THROMBIN (RECOMBINANT) 5000 UNITS EX SOLR
CUTANEOUS | Status: AC
  Filled 2017-04-07: qty 5000

## 2017-04-07 MED ORDER — POLYETHYLENE GLYCOL 3350 17 G PO PACK
17.0000 g | PACK | Freq: Every day | ORAL | Status: DC | PRN
  Administered 2017-04-08: 17 g via ORAL
  Filled 2017-04-07: qty 1

## 2017-04-07 MED ORDER — ARFORMOTEROL TARTRATE 15 MCG/2ML IN NEBU
15.0000 ug | INHALATION_SOLUTION | Freq: Two times a day (BID) | RESPIRATORY_TRACT | Status: DC
  Administered 2017-04-08 (×2): 15 ug via RESPIRATORY_TRACT
  Filled 2017-04-07 (×4): qty 2

## 2017-04-07 MED ORDER — BACITRACIN 50000 UNITS IM SOLR
INTRAMUSCULAR | Status: DC | PRN
  Administered 2017-04-07: 500 mL

## 2017-04-07 MED ORDER — PHENYLEPHRINE 40 MCG/ML (10ML) SYRINGE FOR IV PUSH (FOR BLOOD PRESSURE SUPPORT)
PREFILLED_SYRINGE | INTRAVENOUS | Status: AC
  Filled 2017-04-07: qty 10

## 2017-04-07 MED ORDER — HYDROCODONE-ACETAMINOPHEN 5-325 MG PO TABS
1.0000 | ORAL_TABLET | ORAL | Status: DC | PRN
  Administered 2017-04-08: 2 via ORAL
  Administered 2017-04-08 – 2017-04-09 (×3): 1 via ORAL
  Filled 2017-04-07 (×2): qty 1
  Filled 2017-04-07 (×2): qty 2
  Filled 2017-04-07: qty 1

## 2017-04-07 MED ORDER — EPHEDRINE 5 MG/ML INJ
INTRAVENOUS | Status: AC
  Filled 2017-04-07: qty 10

## 2017-04-07 MED ORDER — SODIUM CHLORIDE 0.9% FLUSH: 3.0000 mL | Freq: Two times a day (BID) | INTRAVENOUS | Status: DC

## 2017-04-07 MED ORDER — SODIUM CHLORIDE 0.9% FLUSH: 3.0000 mL | INTRAVENOUS | Status: DC | PRN

## 2017-04-07 MED ORDER — ONDANSETRON HCL 4 MG/2ML IJ SOLN
INTRAMUSCULAR | Status: AC
  Filled 2017-04-07: qty 2

## 2017-04-07 MED ORDER — PROPOFOL 10 MG/ML IV BOLUS
INTRAVENOUS | Status: AC
  Filled 2017-04-07: qty 20

## 2017-04-07 MED ORDER — ALLOPURINOL 100 MG PO TABS
100.0000 mg | ORAL_TABLET | Freq: Every day | ORAL | Status: DC
  Administered 2017-04-08: 100 mg via ORAL
  Filled 2017-04-07 (×2): qty 1

## 2017-04-07 MED ORDER — SODIUM CHLORIDE 0.9 % IV SOLN: 250.0000 mL | INTRAVENOUS | Status: DC

## 2017-04-07 MED ORDER — TAMSULOSIN HCL 0.4 MG PO CAPS
0.4000 mg | ORAL_CAPSULE | Freq: Every day | ORAL | Status: DC
  Administered 2017-04-07 – 2017-04-08 (×2): 0.4 mg via ORAL
  Filled 2017-04-07 (×2): qty 1

## 2017-04-07 MED ORDER — BUDESONIDE 0.25 MG/2ML IN SUSP
0.2500 mg | Freq: Two times a day (BID) | RESPIRATORY_TRACT | Status: DC
  Administered 2017-04-08 (×2): 0.25 mg via RESPIRATORY_TRACT
  Filled 2017-04-07 (×4): qty 2

## 2017-04-07 MED ORDER — GLYCOPYRROLATE 0.2 MG/ML IJ SOLN: INTRAMUSCULAR | Status: DC | PRN

## 2017-04-07 MED ORDER — PANTOPRAZOLE SODIUM 40 MG PO TBEC
40.0000 mg | DELAYED_RELEASE_TABLET | Freq: Two times a day (BID) | ORAL | Status: DC
  Administered 2017-04-07 – 2017-04-08 (×3): 40 mg via ORAL
  Filled 2017-04-07 (×4): qty 1

## 2017-04-07 SURGICAL SUPPLY — 83 items
ADH SKN CLS APL DERMABOND .7 (GAUZE/BANDAGES/DRESSINGS) ×4
ALCOHOL ISOPROPYL (RUBBING) (MISCELLANEOUS) ×3 IMPLANT
BAG DECANTER FOR FLEXI CONT (MISCELLANEOUS) ×3 IMPLANT
BIT DRILL NEURO 2X3.1 SFT TUCH (MISCELLANEOUS) ×2 IMPLANT
BLADE CLIPPER SURG (BLADE) IMPLANT
BNDG GAUZE ELAST 4 BULKY (GAUZE/BANDAGES/DRESSINGS) IMPLANT
BONE CANC CHIPS 20CC PCAN1/4 (Bone Implant) ×3 IMPLANT
CAGE MODULUS XL 8X18X55 - 10 (Cage) ×1 IMPLANT
CANISTER SUCT 3000ML PPV (MISCELLANEOUS) ×3 IMPLANT
CARTRIDGE OIL MAESTRO DRILL (MISCELLANEOUS) ×2 IMPLANT
CHIPS CANC BONE 20CC PCAN1/4 (Bone Implant) ×2 IMPLANT
COUNTER NEEDLE 20 DBL MAG RED (NEEDLE) ×1 IMPLANT
COVER BACK TABLE 24X17X13 BIG (DRAPES) IMPLANT
COVER TABLE BACK 60X90 (DRAPES) ×1 IMPLANT
DECANTER SPIKE VIAL GLASS SM (MISCELLANEOUS) ×3 IMPLANT
DERMABOND ADVANCED (GAUZE/BANDAGES/DRESSINGS) ×2
DERMABOND ADVANCED .7 DNX12 (GAUZE/BANDAGES/DRESSINGS) ×4 IMPLANT
DIFFUSER DRILL AIR PNEUMATIC (MISCELLANEOUS) ×2 IMPLANT
DISC MOBI-C CERVICAL 17X17 H5 (Screw) ×1 IMPLANT
DRAPE C-ARM 42X72 X-RAY (DRAPES) ×8 IMPLANT
DRAPE C-ARMOR (DRAPES) ×6 IMPLANT
DRAPE LAPAROTOMY 100X72 PEDS (DRAPES) ×3 IMPLANT
DRAPE LAPAROTOMY 100X72X124 (DRAPES) ×3 IMPLANT
DRAPE MICROSCOPE LEICA (MISCELLANEOUS) ×1 IMPLANT
DRAPE POUCH INSTRU U-SHP 10X18 (DRAPES) ×6 IMPLANT
DRILL NEURO 2X3.1 SOFT TOUCH (MISCELLANEOUS) ×3
DURAPREP 26ML APPLICATOR (WOUND CARE) ×3 IMPLANT
DURAPREP 6ML APPLICATOR 50/CS (WOUND CARE) ×3 IMPLANT
ELECT BLADE 4.0 EZ CLEAN MEGAD (MISCELLANEOUS) ×3
ELECT REM PT RETURN 9FT ADLT (ELECTROSURGICAL) ×6
ELECTRODE BLDE 4.0 EZ CLN MEGD (MISCELLANEOUS) IMPLANT
ELECTRODE REM PT RTRN 9FT ADLT (ELECTROSURGICAL) ×4 IMPLANT
GAUZE SPONGE 4X4 16PLY XRAY LF (GAUZE/BANDAGES/DRESSINGS) IMPLANT
GLOVE BIOGEL PI IND STRL 6.5 (GLOVE) IMPLANT
GLOVE BIOGEL PI IND STRL 7.0 (GLOVE) IMPLANT
GLOVE BIOGEL PI IND STRL 8 (GLOVE) IMPLANT
GLOVE BIOGEL PI IND STRL 8.5 (GLOVE) ×4 IMPLANT
GLOVE BIOGEL PI INDICATOR 6.5 (GLOVE) ×2
GLOVE BIOGEL PI INDICATOR 7.0 (GLOVE) ×1
GLOVE BIOGEL PI INDICATOR 8 (GLOVE) ×2
GLOVE BIOGEL PI INDICATOR 8.5 (GLOVE) ×3
GLOVE ECLIPSE 8.0 STRL XLNG CF (GLOVE) ×2 IMPLANT
GLOVE ECLIPSE 8.5 STRL (GLOVE) ×7 IMPLANT
GLOVE EXAM NITRILE LRG STRL (GLOVE) IMPLANT
GLOVE EXAM NITRILE XL STR (GLOVE) IMPLANT
GLOVE EXAM NITRILE XS STR PU (GLOVE) IMPLANT
GLOVE SURG SS PI 6.5 STRL IVOR (GLOVE) ×2 IMPLANT
GOWN STRL REUS W/ TWL LRG LVL3 (GOWN DISPOSABLE) IMPLANT
GOWN STRL REUS W/ TWL XL LVL3 (GOWN DISPOSABLE) ×4 IMPLANT
GOWN STRL REUS W/TWL 2XL LVL3 (GOWN DISPOSABLE) ×8 IMPLANT
GOWN STRL REUS W/TWL LRG LVL3 (GOWN DISPOSABLE) ×9
GOWN STRL REUS W/TWL XL LVL3 (GOWN DISPOSABLE)
GRAFT BNE CANC CHIPS 1-8 20CC (Bone Implant) IMPLANT
HALTER HD/CHIN CERV TRACTION D (MISCELLANEOUS) IMPLANT
HEMOSTAT POWDER KIT SURGIFOAM (HEMOSTASIS) ×3 IMPLANT
KIT BASIN OR (CUSTOM PROCEDURE TRAY) ×5 IMPLANT
KIT DILATOR XLIF 5 (KITS) ×1 IMPLANT
KIT INFUSE X SMALL 1.4CC (Orthopedic Implant) ×1 IMPLANT
KIT ROOM TURNOVER OR (KITS) ×5 IMPLANT
KIT SURGICAL ACCESS MAXCESS 4 (KITS) ×1 IMPLANT
MILL MEDIUM DISP (BLADE) ×1 IMPLANT
MODULE NVM5 NEXT GEN EMG (NEEDLE) ×1 IMPLANT
NDL HYPO 25X1 1.5 SAFETY (NEEDLE) ×2 IMPLANT
NDL SPNL 22GX3.5 QUINCKE BK (NEEDLE) ×2 IMPLANT
NEEDLE HYPO 22GX1.5 SAFETY (NEEDLE) ×3 IMPLANT
NEEDLE HYPO 25X1 1.5 SAFETY (NEEDLE) ×3 IMPLANT
NEEDLE SPNL 22GX3.5 QUINCKE BK (NEEDLE) ×3 IMPLANT
NS IRRIG 1000ML POUR BTL (IV SOLUTION) ×6 IMPLANT
OIL CARTRIDGE MAESTRO DRILL (MISCELLANEOUS)
PACK LAMINECTOMY NEURO (CUSTOM PROCEDURE TRAY) ×6 IMPLANT
PAD ARMBOARD 7.5X6 YLW CONV (MISCELLANEOUS) ×10 IMPLANT
PLATE 2H DECADE 8MM (Plate) ×1 IMPLANT
RUBBERBAND STERILE (MISCELLANEOUS) ×2 IMPLANT
SCREW DECADE 5.5X50 (Screw) ×2 IMPLANT
SPONGE INTESTINAL PEANUT (DISPOSABLE) ×3 IMPLANT
SPONGE LAP 4X18 X RAY DECT (DISPOSABLE) IMPLANT
SUT VIC AB 2-0 CP2 18 (SUTURE) ×3 IMPLANT
SUT VIC AB 3-0 SH 8-18 (SUTURE) ×5 IMPLANT
SUT VIC AB 4-0 RB1 18 (SUTURE) ×2 IMPLANT
TOWEL GREEN STERILE (TOWEL DISPOSABLE) ×5 IMPLANT
TOWEL GREEN STERILE FF (TOWEL DISPOSABLE) ×5 IMPLANT
TRAY FOLEY W/METER SILVER 16FR (SET/KITS/TRAYS/PACK) ×3 IMPLANT
WATER STERILE IRR 1000ML POUR (IV SOLUTION) ×6 IMPLANT

## 2017-04-07 NOTE — Progress Notes (Signed)
Pt states he cant take a deep breath in, lungs sound clear, O2 sats 97 om 2 liters, Dr Conrad Oklee made aware

## 2017-04-07 NOTE — Progress Notes (Signed)
Dr Ellene Route aware pt states hard to breathe, sats 98

## 2017-04-07 NOTE — Anesthesia Preprocedure Evaluation (Signed)
Anesthesia Evaluation  Patient identified by MRN, date of birth, ID band Patient awake    Reviewed: Allergy & Precautions, NPO status , Patient's Chart, lab work & pertinent test results  Airway Mallampati: I  TM Distance: >3 FB Neck ROM: Full    Dental   Pulmonary COPD,    Pulmonary exam normal        Cardiovascular Normal cardiovascular exam+ dysrhythmias Atrial Fibrillation      Neuro/Psych    GI/Hepatic GERD  Medicated and Controlled,  Endo/Other    Renal/GU      Musculoskeletal   Abdominal   Peds  Hematology   Anesthesia Other Findings   Reproductive/Obstetrics                             Anesthesia Physical Anesthesia Plan  ASA: III  Anesthesia Plan: General   Post-op Pain Management:    Induction: Intravenous  PONV Risk Score and Plan: 2 and Dexamethasone, Ondansetron and Treatment may vary due to age or medical condition  Airway Management Planned: Oral ETT  Additional Equipment:   Intra-op Plan:   Post-operative Plan: Extubation in OR  Informed Consent: I have reviewed the patients History and Physical, chart, labs and discussed the procedure including the risks, benefits and alternatives for the proposed anesthesia with the patient or authorized representative who has indicated his/her understanding and acceptance.     Plan Discussed with: CRNA and Surgeon  Anesthesia Plan Comments:         Anesthesia Quick Evaluation

## 2017-04-07 NOTE — Transfer of Care (Signed)
Immediate Anesthesia Transfer of Care Note  Patient: Levi [name]  Procedure(s) Performed: Thoracic twelve-Lumbar one Anterolateral decompression/fusion (N/A Back) Cervical six-seven Disc arthroplasty (N/A Spine Cervical)  Patient Location: PACU  Anesthesia Type:General  Level of Consciousness: awake, alert , oriented and patient cooperative  Airway & Oxygen Therapy: Patient Spontanous Breathing and Patient connected to nasal cannula oxygen  Post-op Assessment: Report given to RN and Post -op Vital signs reviewed and stable  Post vital signs: Reviewed and stable  Last Vitals:  Vitals:   04/07/17 0611  BP: 129/72  Pulse: (!) 56  Resp: 20  Temp: 36.7 C  SpO2: 98%    Last Pain:  Vitals:   04/07/17 0626  TempSrc:   PainSc: 3          Complications: No apparent anesthesia complications

## 2017-04-07 NOTE — Evaluation (Signed)
Physical Therapy Evaluation Patient Details Name: Levi [name] MRN: [id number] DOB: [birth year] Today's Date: 04/07/2017   History of Present Illness  Patient is a 71 year old male s/p Thoracic twelve-Lumbar one Anterolateral decompression/fusion (N/A Back)  Clinical Impression  Orders received for PT evaluation. Patient demonstrates deficits in functional mobility as indicated below. Will benefit from continued skilled PT to address deficits and maximize function. Will see as indicated and progress as tolerated.  Patient seen for mobility assessment s/p spinal surgery. Educated patient on precautions and mobility expectations.   OF NOTE: patient limited by nausea     Follow Up Recommendations No PT follow up;Supervision/Assistance - 24 hour    Equipment Recommendations  None recommended by PT    Recommendations for Other Services       Precautions / Restrictions Precautions Precautions: Cervical;Back Precaution Booklet Issued: Yes (comment) Precaution Comments: provided both cervical and spinal precautions, verbally reviewed with patient Required Braces or Orthoses: Spinal Brace Spinal Brace: Lumbar corset Restrictions Weight Bearing Restrictions: No      Mobility  Bed Mobility Overal bed mobility: Needs Assistance Bed Mobility: Rolling;Sidelying to Sit;Sit to Sidelying Rolling: Min assist Sidelying to sit: Min assist     Sit to sidelying: Min assist General bed mobility comments: min assist for all aspects of bed mobility, increased pain noted with transitional movements  Transfers Overall transfer level: Needs assistance Equipment used: 1 person hand held assist Transfers: Sit to/from Stand Sit to Stand: Min assist         General transfer comment: min assist to elevate to standing, noted instability and increased time and effort to come to standing.   Ambulation/Gait Ambulation/Gait assistance: Min assist Ambulation Distance (Feet): 16 Feet Assistive  device: 2 person hand held assist Gait Pattern/deviations: Step-to pattern;Decreased stride length;Shuffle;Trunk flexed Gait velocity: decreased Gait velocity interpretation: Below normal speed for age/gender General Gait Details: Vcs for upright posture, assist for stability. Limited activity tolerance at this time due to nausea  Stairs            Wheelchair Mobility    Modified Rankin (Stroke Patients Only)       Balance Overall balance assessment: Needs assistance   Sitting balance-[name] Scale: Fair Sitting balance - Comments: pain with dynamic movement   Standing balance support: During functional activity Standing balance-[name] Scale: Poor Standing balance comment: reliance on UE support                             Pertinent Vitals/Pain Pain Assessment: 0-10 Pain Score: 7  Pain Location: operative site on flank Pain Descriptors / Indicators: Guarding;Grimacing;Operative site guarding;Sore Pain Intervention(s): Monitored during session;Repositioned;Limited activity within patient's tolerance    Home Living Family/patient expects to be discharged to:: Private residence Living Arrangements: Spouse/significant other Available Help at Discharge: Family;Available 24 hours/day Type of Home: House Home Access: Stairs to enter Entrance Stairs-Rails: Left Entrance Stairs-Number of Steps: 2 Home Layout: Multi-level        Prior Function Level of Independence: Independent         Comments: very active     Hand Dominance   Dominant Hand: Right    Extremity/Trunk Assessment   Upper Extremity Assessment Upper Extremity Assessment: Defer to OT evaluation    Lower Extremity Assessment Lower Extremity Assessment: Overall WFL for tasks assessed    Cervical / Trunk Assessment Cervical / Trunk Assessment: (s/p back and neck sx)  Communication   Communication: (some dysarthric speech)  Cognition Arousal/Alertness: Awake/alert Behavior During  Therapy: WFL for tasks assessed/performed Overall Cognitive Status: Within Functional Limits for tasks assessed                                        General Comments      Exercises     Assessment/Plan    PT Assessment Patient needs continued PT services  PT Problem List Decreased activity tolerance;Decreased balance;Decreased mobility;Decreased safety awareness;Pain       PT Treatment Interventions DME instruction;Gait training;Stair training;Functional mobility training;Therapeutic activities;Therapeutic exercise;Balance training;Patient/family education    PT Goals (Current goals can be found in the Care Plan section)  Acute Rehab PT Goals Patient Stated Goal: to go home PT Goal Formulation: With patient/family Time For Goal Achievement: 04/21/17 Potential to Achieve Goals: Good    Frequency Min 5X/week   Barriers to discharge        Co-evaluation               AM-PAC PT "6 Clicks" Daily Activity  Outcome Measure Difficulty turning over in bed (including adjusting bedclothes, sheets and blankets)?: Unable Difficulty moving from lying on back to sitting on the side of the bed? : Unable Difficulty sitting down on and standing up from a chair with arms (e.g., wheelchair, bedside commode, etc,.)?: Unable Help needed moving to and from a bed to chair (including a wheelchair)?: A Little Help needed walking in hospital room?: A Little Help needed climbing 3-5 steps with a railing? : A Lot 6 Click Score: 11    End of Session Equipment Utilized During Treatment: Back brace Activity Tolerance: Patient limited by pain;Treatment limited secondary to medical complications (Comment)(nausea) Patient left: in bed;with call bell/phone within reach Nurse Communication: Mobility status;Precautions PT Visit Diagnosis: Unsteadiness on feet (R26.81);Pain    Time: 2956-2130 PT Time Calculation (min) (ACUTE ONLY): 21 min   Charges:   PT Evaluation $PT Eval  Moderate Complexity: 1 Mod     PT G Codes:        Alben Deeds, PT DPT  Board Certified Neurologic Specialist [place] 04/07/2017, 4:36 PM

## 2017-04-07 NOTE — Progress Notes (Signed)
Vital signs are stable Motor function is intact Incisions are clean and dry Doing well postop

## 2017-04-07 NOTE — Anesthesia Postprocedure Evaluation (Signed)
Anesthesia Post Note  Patient: Levi [name]  Procedure(s) Performed: Thoracic twelve-Lumbar one Anterolateral decompression/fusion (N/A Back) Cervical six-seven Disc arthroplasty (N/A Spine Cervical)     Patient location during evaluation: PACU Anesthesia Type: General Level of consciousness: awake and alert Pain management: pain level controlled Vital Signs Assessment: post-procedure vital signs reviewed and stable Respiratory status: spontaneous breathing, nonlabored ventilation, respiratory function stable and patient connected to nasal cannula oxygen Cardiovascular status: blood pressure returned to baseline and stable Postop Assessment: no apparent nausea or vomiting Anesthetic complications: no    Last Vitals:  Vitals:   04/07/17 1341 04/07/17 1342  BP:    Pulse: (!) 51 (!) 53  Resp: 12 15  Temp:  (!) 36.2 C  SpO2: 94% 93%    Last Pain:  Vitals:   04/07/17 1603  TempSrc:   PainSc: 6                  Mattelyn Imhoff DAVID

## 2017-04-07 NOTE — Op Note (Signed)
Date of operation: 04/07/2017 Preoperative diagnosis: Cervical spondylosis with radiculopathy C6-C7 status post arthrodesis C4-C6. Postoperative diagnosis: Same Procedure: Anterior cervical decompression C6-C7 arthroplasty using mobi C artificial disc measuring 17 x 17 mm x 5 mm in height. Surgeon: [name] First assistant: Cyndy Freeze M.D. Indications: The patient is a 71 year old individual is been having symptoms of pain in the neck with numbness weakness in the arms and legs. He was found to have severe stenosis at C6-C7 having had a previous decompression C4-C6. He also has lumbar spondylosis and stenosis and adjacent level ones undergoing surgery there.  Procedure: Patient was brought to the operating room placed on the table in supine position after his anterolateral decompression of T12-L1 was completed. The neck was prepped with alcohol and DuraPrep and draped in a sterile fashion. A transverse incision was created in neck and carried down to the platysma the plane between the sternocleidomastoid and strap muscles dissected bluntly until the prevertebral space was reached the first identifiable disc space was noted below the level of the previous plate at S9-H7 some ventral osteophytes were uncovered in this region and the dissection was carried down into the disc space. A 2 mm dissecting tool was used to enlarge the disc space and gradually a self-retaining disc spreader could be placed into the wound the region of the posterior longitudinal ligament was then decompressed by drilling away osteophytic overgrowth. Ultimately the osteophytes could be removed in total using a 2 mm Kerrison punch dissection was carried out laterally to decompress first the right than the left C7 nerve root. Once the decompression was obtained hemostasis was obtained meticulously interspace was sized for appropriate size spacer was ultimately felt that a 17 x 17 mm spacer with 5 mm of height would fit best into  this interval this was then placed under fluoroscopic guidance with AP and lateral control. Once good placement was assured on follow-up x-rays in the OR the wound was copiously irrigated with antibiotic irrigating solution and the platysma was closed with 3-0 Vicryl in interrupted fashion and 3-0 Vicryl was used in the subsequent tissues Dermabond was placed in the skin patient tolerated procedure well to return to recovery room in stable condition with less than 50 mL of blood loss for the second portion of the procedure.

## 2017-04-07 NOTE — Op Note (Signed)
Date of surgery: 04/07/2017 Preoperative diagnosis: T12-L1 spondylosis with stenosis, back pain, neurogenic claudication Postoperative diagnosis: The same Procedure is anterolateral decompression of T12-L1 with total discectomy and anterolateral arthrodesis using titanium spacer and lateral screw-plate fixation allograft arthrodesis and infuse. Surgeon: [name] Anesthesia: Gen. endotracheal Indications: Levi [name] is a 71 year old individual who's had significant issues of chronic back pain with neurogenic claudication symptoms he has evidence of both cervical stenosis and lumbar stenosis at the T12-L1 level having had a previous decompression fusion from L1 to the sacrum. He has been advised regarding the need for surgery to decompress and stabilize the T12-L1 level and this is being done via an anterolateral approach.  Procedure: Patient was brought to the operating room supine on a stretcher. After the smooth induction of general endotracheal anesthesia and placement of appropriate monitoring electrodes and various musculature and placement of Foley catheter is carefully turned into the right lateral decubitus position and his body was taped in place. Fluoroscopic guidance was used to localize the T12-L1 space through a lateral approach and the approach line was drawn on the skin. Is felt that this would be between the 10th and the 11th rib. Area was cleansed with alcohol and then prepped with DuraPrep and draped in a sterile fashion. Then a small vertical incision was made at the marked area and a small vertical incision was made inferiorly and posteriorly below the rib cage approximately one fingers length away. The posterior incision was dissected through the paraspinous musculature with a Kelly clamp and then the finger could be admitted into the retroperitoneal space. Then the second incision was dissected down through the thoracic wall musculature and it was felt that the opening would be  below the level of the diaphragm but actually was not. The blunt probe was used to pass a channel through to the retroperitoneal space and then with fluoroscopic guidance to the disc space at the T12-L1 level. The disc space was entered and approximately mid-level and large osteophytes in the lateral aspect were encountered here. Then by using a series of dilators and during some neurotransmission is felt that no nerves were in the way in the area was dilated to the 15 mm size. A nuvasive style self-retaining clamp was then used to open the area above this incision. Plating system was attached and the area was inspected down at the level of the lateral disc space at T12-L1. A shim was placed on the posterior aspect of the opening. Was all checked fluoroscopically. Discectomy was then performed via the lateral aspect and large on steel fights on the lateral aspect were removed using a combination of curettes and rongeurs. The disc space was entered and then by passing a series of dilators the disc space could be enlarged. This allowed for curettage of the endplates to remove any remaining cartilaginous endplate material. The size was then determined as of was gradually distracted to the 8 mm height. Most of the placement of the graft went to proceed ventrally. Care was taken to make sure that enough of a dorsal clean out in the disc space was also performed. Ultimately was felt that a lordotic spacer measuring 8 mm anteriorly 6 mm posteriorly 18 mm in width 55 mm in length would fit well into the interspace. The titanium spacer this design was then used this was filled with allograft and infuse bone sponge. This was then placed under fluoroscopic guidance. When good placement was felt to be noted under both AP and lateral fluoroscopy  attention was turned to placing a lateral plate area and a millimeter tall lateral plate was placed and secured to the T12 and L1 vertebral vertebral bodies using 50 mm long screws. A 2  screw design was used. System was tightened to the appropriate torque specifications and then the retractors removed and final radiographs were obtained. Was indeed opening into the pleural space this was then closed using a red Robinson catheter to evacuate both liquid and air as a deep breath was administered. The wound was closed in layers with 2-0 Vicryl musculature 2-0 Vicryl in subcutaneous tissues and 3-0 Vicryl subcuticularly in both incisions. Patient tolerated procedure well blood loss was estimated at less than 100 mL he was then turned to the supine position of the second portion of this operation which wasn't anterior cervical decompression at C6-C7 with arthroplasty.

## 2017-04-08 ENCOUNTER — Other Ambulatory Visit: Payer: Self-pay

## 2017-04-08 ENCOUNTER — Inpatient Hospital Stay (HOSPITAL_COMMUNITY): Payer: Medicare Other

## 2017-04-08 LAB — BASIC METABOLIC PANEL
Anion gap: 8 (ref 5–15)
BUN: 15 mg/dL (ref 6–20)
CO2: 24 mmol/L (ref 22–32)
Calcium: 8.6 mg/dL — ABNORMAL LOW (ref 8.9–10.3)
Chloride: 101 mmol/L (ref 101–111)
Creatinine, Ser: 0.65 mg/dL (ref 0.61–1.24)
GFR calc Af Amer: >60 mL/min (ref >60)
GFR calc non Af Amer: >60 mL/min (ref >60)
Glucose, Bld: 102 mg/dL — ABNORMAL HIGH (ref 65–99)
Potassium: 3.8 mmol/L (ref 3.5–5.1)
Sodium: 133 mmol/L — ABNORMAL LOW (ref 135–145)

## 2017-04-08 LAB — CBC
HCT: 34.1 % — ABNORMAL LOW (ref 39.0–52.0)
Hemoglobin: 11.2 g/dL — ABNORMAL LOW (ref 13.0–17.0)
MCH: 31.1 pg (ref 26.0–34.0)
MCHC: 32.8 g/dL (ref 30.0–36.0)
MCV: 94.7 fL (ref 78.0–100.0)
Platelets: 202 10*3/uL (ref 150–400)
RBC: 3.6 MIL/uL — ABNORMAL LOW (ref 4.22–5.81)
RDW: 15.2 % (ref 11.5–15.5)
WBC: 14.3 10*3/uL — ABNORMAL HIGH (ref 4.0–10.5)

## 2017-04-08 MED ORDER — POLYETHYLENE GLYCOL 3350 17 G PO PACK: 17.0000 g | PACK | Freq: Every day | ORAL | Status: DC | PRN

## 2017-04-08 MED ORDER — DEXAMETHASONE 4 MG PO TABS
2.0000 mg | ORAL_TABLET | Freq: Once | ORAL | Status: AC
  Administered 2017-04-08: 2 mg via ORAL
  Filled 2017-04-08: qty 1

## 2017-04-08 NOTE — Progress Notes (Signed)
Physical Therapy Treatment Patient Details Name: Levi [name] MRN: [id number] DOB: December 20, 1945 Today's Date: 04/08/2017    History of Present Illness Patient is a 71 year old male s/p Thoracic twelve-Lumbar one Anterolateral decompression/fusion (N/A Back)    PT Comments    Patient seen for mobility progression s/p spinal surgery. Mobilizing well. Educated patient on precautions, mobility expectations, safety and car transfers. Current POC appropriate.     Follow Up Recommendations  No PT follow up;Supervision/Assistance - 24 hour     Equipment Recommendations  None recommended by PT    Recommendations for Other Services       Precautions / Restrictions Precautions Precautions: Cervical;Back Precaution Booklet Issued: Yes (comment) Precaution Comments: provided both cervical and spinal precautions, verbally reviewed with patient Required Braces or Orthoses: Spinal Brace Spinal Brace: Lumbar corset Restrictions Weight Bearing Restrictions: No    Mobility  Bed Mobility               General bed mobility comments: received in standing with RW  Transfers Overall transfer level: Modified independent Equipment used: Rolling walker (2 wheeled) Transfers: Sit to/from Stand              Ambulation/Gait Ambulation/Gait assistance: Modified independent (Device/Increase time) Ambulation Distance (Feet): 420 Feet Assistive device: Rolling walker (2 wheeled) Gait Pattern/deviations: Step-through pattern;Trunk flexed Gait velocity: decreased Gait velocity interpretation: Below normal speed for age/gender General Gait Details: ambulating with use of RW   Stairs            Wheelchair Mobility    Modified Rankin (Stroke Patients Only)       Balance Overall balance assessment: Needs assistance   Sitting balance-[name] Scale: Good     Standing balance support: During functional activity Standing balance-[name] Scale: Fair Standing balance comment:  able to release RW and move outside BOS for standing                            Cognition Arousal/Alertness: Awake/alert Behavior During Therapy: WFL for tasks assessed/performed Overall Cognitive Status: Within Functional Limits for tasks assessed                                        Exercises      General Comments        Pertinent Vitals/Pain Pain Assessment: 0-10 Pain Score: 5  Pain Location: operative site on flank Pain Descriptors / Indicators: Guarding;Grimacing;Operative site guarding;Sore Pain Intervention(s): Monitored during session    Home Living                      Prior Function            PT Goals (current goals can now be found in the care plan section) Acute Rehab PT Goals Patient Stated Goal: to go home PT Goal Formulation: With patient/family Time For Goal Achievement: 04/21/17 Potential to Achieve Goals: Good Progress towards PT goals: Progressing toward goals    Frequency    Min 5X/week      PT Plan Current plan remains appropriate    Co-evaluation              AM-PAC PT "6 Clicks" Daily Activity  Outcome Measure  Difficulty turning over in bed (including adjusting bedclothes, sheets and blankets)?: A Little Difficulty moving from lying on back to sitting on the side  of the bed? : A Little Difficulty sitting down on and standing up from a chair with arms (e.g., wheelchair, bedside commode, etc,.)?: A Little Help needed moving to and from a bed to chair (including a wheelchair)?: None Help needed walking in hospital room?: None Help needed climbing 3-5 steps with a railing? : A Little 6 Click Score: 20    End of Session Equipment Utilized During Treatment: Back brace Activity Tolerance: Patient tolerated treatment well Patient left: in bed;with call bell/phone within reach(sitting EOB) Nurse Communication: Mobility status;Precautions PT Visit Diagnosis: Unsteadiness on feet  (R26.81);Pain     Time: 0233-4356 PT Time Calculation (min) (ACUTE ONLY): 14 min  Charges:  $Gait Training: 8-22 mins                    G Codes:       Alben Deeds, PT DPT  Board Certified Neurologic Specialist [place] 04/08/2017, 10:20 AM

## 2017-04-08 NOTE — Progress Notes (Signed)
Vital signs are stable Breathing is much better today Patient notes that there is certainly improvement in his back pain and his neck pain today compared to preoperatively Incisions are clean and dry We'll check an x-ray today Continue to encourage ambulation Plan discharge for tomorrow

## 2017-04-08 NOTE — Evaluation (Signed)
Occupational Therapy Evaluation Patient Details Name: Levi [name] MRN: [id number] DOB: 1946-01-30 Today's Date: 04/08/2017    History of Present Illness Patient is a 71 year old male s/p Thoracic twelve-Lumbar one Anterolateral decompression/fusion (N/A Back)   Clinical Impression   This 71 y/o F presents with the above. At baseline Pt is independent with ADLs and functional mobility. Pt completed functional mobility at RW level with minguard-supervision this session. Reviewed and educated on AE/compensatory techniques for completing ADLs while adhering to back precautions with Pt return demonstrating and verbalizing understanding, with Pt requiring Min verbal cues for adhering to back precautions during task completion. Will continue to follow acutely to maximize Pt's safety and independence with ADLs and mobility while adhering to precautions.    Follow Up Recommendations  No OT follow up;Supervision/Assistance - 24 hour    Equipment Recommendations  None recommended by OT           Precautions / Restrictions Precautions Precautions: Cervical;Back Precaution Booklet Issued: Yes (comment) Precaution Comments: provided both cervical and spinal precautions, verbally reviewed with patient Required Braces or Orthoses: Spinal Brace Spinal Brace: Lumbar corset;Applied in sitting position Restrictions Weight Bearing Restrictions: No      Mobility Bed Mobility               General bed mobility comments: received in standing with RW; verbally reviewed log roll technique with Pt   Transfers Overall transfer level: Modified independent Equipment used: Rolling walker (2 wheeled) Transfers: Sit to/from Stand                Balance Overall balance assessment: Needs assistance   Sitting balance-[name] Scale: Good     Standing balance support: During functional activity Standing balance-[name] Scale: Fair Standing balance comment: able to release RW and maintain  static standing                            ADL either performed or assessed with clinical judgement   ADL Overall ADL's : Needs assistance/impaired Eating/Feeding: Set up;Sitting   Grooming: Min guard;Standing   Upper Body Bathing: Min guard;Sitting   Lower Body Bathing: Min guard;Sit to/from stand;With adaptive equipment;Adhering to back precautions   Upper Body Dressing : Sitting;Min guard   Lower Body Dressing: Min guard;Sit to/from stand;With adaptive equipment;Cueing for back precautions Lower Body Dressing Details (indicate cue type and reason): Pt return demonstrating use of sock aide; verbally reviewed use of reacher for LB dressing  Toilet Transfer: Min guard;Ambulation;Regular Toilet;Grab bars;RW   Toileting- Water quality scientist and Hygiene: Sit to/from stand;Minimal assistance Toileting - Clothing Manipulation Details (indicate cue type and reason): educated on AE for completing peri-care with Pt verbalizing understanding    Tub/Shower Transfer Details (indicate cue type and reason): educated on completing bathin in sitting after initial return home for increased safety and ease of task completion while adhering to back/cervical precautions Functional mobility during ADLs: Min guard;Rolling walker General ADL Comments: Min verbal cues for adhering to back precautions during task completion as pt attempting to bend towards foot to pull sock up after donning sock using sock aide; Pt overall familiar with using AE from previous surgeries, would benefit from continued practice to reinforce back precautions during functional task completion                           Pertinent Vitals/Pain Pain Assessment: Faces Pain Score: 5  Faces Pain Scale: Hurts  a little bit Pain Location: operative site on flank Pain Descriptors / Indicators: Guarding;Grimacing;Operative site guarding;Sore Pain Intervention(s): Monitored during session     Hand Dominance Right    Extremity/Trunk Assessment Upper Extremity Assessment Upper Extremity Assessment: Overall WFL for tasks assessed   Lower Extremity Assessment Lower Extremity Assessment: Defer to PT evaluation   Cervical / Trunk Assessment Cervical / Trunk Assessment: Other exceptions(s/p back and neck surgery )   Communication Communication Communication: (some dysarthric speech)   Cognition Arousal/Alertness: Awake/alert Behavior During Therapy: WFL for tasks assessed/performed Overall Cognitive Status: Within Functional Limits for tasks assessed                                                      Home Living Family/patient expects to be discharged to:: Private residence Living Arrangements: Spouse/significant other Available Help at Discharge: Family;Available 24 hours/day Type of Home: House Home Access: Stairs to enter [employer] of Steps: 2 Entrance Stairs-Rails: Left Home Layout: Multi-level Alternate Level Stairs-Number of Steps: 12 Alternate Level Stairs-Rails: Right Bathroom Shower/Tub: Hospital doctor Toilet: Handicapped height     Home Equipment: Environmental consultant - 2 wheels;Cane - single point;Bedside commode;Shower seat - built Hotel manager: Reacher;Sock aid;Long-handled sponge;Long-handled shoe horn        Prior Functioning/Environment Level of Independence: Independent        Comments: very active        OT Problem List: Decreased knowledge of precautions;Decreased knowledge of use of DME or AE      OT Treatment/Interventions: Self-care/ADL training;DME and/or AE instruction;Therapeutic activities;Balance training;Therapeutic exercise;Energy conservation;Patient/family education    OT Goals(Current goals can be found in the care plan section) Acute Rehab OT Goals Patient Stated Goal: to go home OT Goal Formulation: With patient Time For Goal Achievement: 04/22/17 Potential to Achieve Goals: Good   OT Frequency: Min 2X/week                             AM-PAC PT "6 Clicks" Daily Activity     Outcome Measure Help from another person eating meals?: None Help from another person taking care of personal grooming?: None Help from another person toileting, which includes using toliet, bedpan, or urinal?: A Little Help from another person bathing (including washing, rinsing, drying)?: A Little Help from another person to put on and taking off regular upper body clothing?: A Little Help from another person to put on and taking off regular lower body clothing?: A Little 6 Click Score: 20   End of Session Equipment Utilized During Treatment: Rolling walker;Back brace Nurse Communication: Mobility status  Activity Tolerance: Patient tolerated treatment well Patient left: Other (comment);with call bell/phone within reach(seated EOB )  OT Visit Diagnosis: Other abnormalities of gait and mobility (R26.89)                Time: 2585-2778 OT Time Calculation (min): 17 min Charges:  OT General Charges $OT Visit: 1 Visit OT Evaluation $OT Eval Low Complexity: 1 Low G-Codes:     Lou Cal, OT Pager [phone] 04/08/2017   Raymondo Band 04/08/2017, 12:03 PM

## 2017-04-09 ENCOUNTER — Encounter (HOSPITAL_COMMUNITY): Payer: Self-pay | Admitting: Neurological Surgery

## 2017-04-09 ENCOUNTER — Inpatient Hospital Stay (HOSPITAL_COMMUNITY): Payer: Medicare Other

## 2017-04-09 MED ORDER — OXYCODONE-ACETAMINOPHEN 5-325 MG PO TABS: 1.0000 | ORAL_TABLET | ORAL | 0 refills | Status: DC | PRN

## 2017-04-09 MED ORDER — CYCLOBENZAPRINE HCL 10 MG PO TABS: 10.0000 mg | ORAL_TABLET | Freq: Three times a day (TID) | ORAL | 5 refills | Status: DC | PRN

## 2017-04-09 NOTE — Progress Notes (Signed)
Physical Therapy Treatment Patient Details Name: Levi [name] MRN: [id number] DOB: 02/21/1946 Today's Date: 04/09/2017    History of Present Illness Patient is a 71 year old male s/p Thoracic twelve-Lumbar one Anterolateral decompression/fusion (N/A Back)    PT Comments    Pt making good progress with mobility, ambulating without use of an AD this session. Plan is for pt to d/c home today with spouse to assist as needed.   Follow Up Recommendations  No PT follow up;Supervision/Assistance - 24 hour     Equipment Recommendations  None recommended by PT    Recommendations for Other Services       Precautions / Restrictions Precautions Precautions: Cervical;Back Precaution Comments: PT reviewed 3/3 back precautions with pt throughout Required Braces or Orthoses: Spinal Brace Spinal Brace: Lumbar corset;Applied in sitting position Restrictions Weight Bearing Restrictions: No    Mobility  Bed Mobility               General bed mobility comments: pt standing in doorway of his room upon arrival  Transfers Overall transfer level: Modified independent Equipment used: None                Ambulation/Gait Ambulation/Gait assistance: Modified independent (Device/Increase time) Ambulation Distance (Feet): 500 Feet Assistive device: None Gait Pattern/deviations: Step-through pattern Gait velocity: decreased Gait velocity interpretation: Below normal speed for age/gender General Gait Details: pt ambulated without use of an AD with no instability or LOB   Stairs            Wheelchair Mobility    Modified Rankin (Stroke Patients Only)       Balance Overall balance assessment: Needs assistance Sitting-balance support: Feet supported Sitting balance-[name] Scale: Good     Standing balance support: During functional activity;No upper extremity supported Standing balance-[name] Scale: Good                              Cognition  Arousal/Alertness: Awake/alert Behavior During Therapy: WFL for tasks assessed/performed Overall Cognitive Status: Within Functional Limits for tasks assessed                                        Exercises      General Comments        Pertinent Vitals/Pain Pain Assessment: No/denies pain    Home Living                      Prior Function            PT Goals (current goals can now be found in the care plan section) Acute Rehab PT Goals PT Goal Formulation: With patient/family Time For Goal Achievement: 04/21/17 Potential to Achieve Goals: Good Progress towards PT goals: Progressing toward goals    Frequency    Min 5X/week      PT Plan Current plan remains appropriate    Co-evaluation              AM-PAC PT "6 Clicks" Daily Activity  Outcome Measure  Difficulty turning over in bed (including adjusting bedclothes, sheets and blankets)?: A Little Difficulty moving from lying on back to sitting on the side of the bed? : A Little Difficulty sitting down on and standing up from a chair with arms (e.g., wheelchair, bedside commode, etc,.)?: A Little Help needed moving to and from a  bed to chair (including a wheelchair)?: None Help needed walking in hospital room?: None Help needed climbing 3-5 steps with a railing? : None 6 Click Score: 21    End of Session Equipment Utilized During Treatment: Back brace Activity Tolerance: Patient tolerated treatment well Patient left: with call bell/phone within reach;Other (comment)(standing in room) Nurse Communication: Mobility status;Precautions PT Visit Diagnosis: Unsteadiness on feet (R26.81);Pain Pain - part of body: (back, neck)     Time: 0051-1021 PT Time Calculation (min) (ACUTE ONLY): 9 min  Charges:  $Gait Training: 8-22 mins                    G Codes:       [place], [place], [place] 04/09/2017, 8:46 AM

## 2017-04-09 NOTE — Progress Notes (Signed)
Occupational Therapy Treatment Patient Details Name: Levi [name] MRN: [id number] DOB: 11/20/1945 Today's Date: 04/09/2017    History of present illness Patient is a 71 year old male s/p Thoracic twelve-Lumbar one Anterolateral decompression/fusion (N/A Back)   OT comments  Focus of session on additional review/education of compensatory techniques and AE for completing ADLs while adhering to back precautions. Pt demonstrates LB dressing using AE and standing grooming ADLs with supervision throughout and with good demonstration of adhering to back precautions during task completion. Education provided and questions answered throughout. Feel Pt will safely return home with spouse assist PRN. No further acute OT needs identified at this time. Will sign off.    Follow Up Recommendations  No OT follow up;Supervision/Assistance - 24 hour    Equipment Recommendations  None recommended by OT          Precautions / Restrictions Precautions Precautions: Cervical;Back Precaution Comments: reviewed 3/3 back precautions with pt throughout Required Braces or Orthoses: Spinal Brace Spinal Brace: Lumbar corset;Applied in sitting position Restrictions Weight Bearing Restrictions: No       Mobility Bed Mobility               General bed mobility comments: Pt seated EOB upon arrival   Transfers Overall transfer level: Modified independent Equipment used: None                  Balance Overall balance assessment: Needs assistance Sitting-balance support: Feet supported Sitting balance-[name] Scale: Good     Standing balance support: During functional activity;No upper extremity supported Standing balance-[name] Scale: Good                             ADL either performed or assessed with clinical judgement   ADL       Grooming: Min guard;Standing;Oral care               Lower Body Dressing: Min guard;Sit to/from stand;With adaptive equipment;Adhering  to back precautions Lower Body Dressing Details (indicate cue type and reason): practiced use of reacher for LB dressing (simulated using pillow case)              Functional mobility during ADLs: Min guard General ADL Comments: further reviewed/educated on AE and compensatory techniques for completing ADLs while adhering to back precautions with Pt verbalizing and return demonstrating understanding                        Cognition Arousal/Alertness: Awake/alert Behavior During Therapy: WFL for tasks assessed/performed Overall Cognitive Status: Within Functional Limits for tasks assessed                                                            Pertinent Vitals/ Pain       Pain Assessment: No/denies pain                                                          Frequency  Min 2X/week        Progress Toward Goals  OT Goals(current  goals can now be found in the care plan section)  Progress towards OT goals: Goals met/education completed, patient discharged from OT  Acute Rehab OT Goals Patient Stated Goal: to go home  Plan All goals met and education completed, patient discharged from [facility] PT "6 Clicks" Daily Activity     Outcome Measure   Help from another person eating meals?: None Help from another person taking care of personal grooming?: None Help from another person toileting, which includes using toliet, bedpan, or urinal?: A Little Help from another person bathing (including washing, rinsing, drying)?: A Little Help from another person to put on and taking off regular upper body clothing?: A Little Help from another person to put on and taking off regular lower body clothing?: A Little 6 Click Score: 20    End of Session Equipment Utilized During Treatment: Back brace  OT Visit Diagnosis: Other abnormalities of gait and mobility (R26.89)   Activity  Tolerance Patient tolerated treatment well   Patient Left with call bell/phone within reach;Other (comment)(sitting EOB )   Nurse Communication Mobility status        Time: 2637-8588 OT Time Calculation (min): 10 min  Charges: OT General Charges $OT Visit: 1 Visit OT Treatments $Self Care/Home Management : 8-22 mins  Lou Cal, OT Pager [phone] 04/09/2017    Raymondo Band 04/09/2017, 10:24 AM

## 2017-04-09 NOTE — Discharge Summary (Signed)
Date of admission: 04/07/2017 Date of discharge: 04/09/2017 Condition on discharge: Improved Admitting diagnosis: #1 spondylosis and stenosis T12-L1 with neurogenic claudication and severe back pain #2 cervical spondylosis and stenosis with myelopathy C6-C7 history of previous fusion C4-C6 Discharge and final diagnosis: #1 spondylosis and stenosis T12-L1 with neurogenic claudication and severe back pain, history of fusion L1-S1. #2 cervical spondylosis and stenosis with myelopathy C6-C7, history of previous fusion C4-C6. #3 history of Agent Orange exposure. #4 COPD. #5 postoperative pneumothorax  Hospital course: The patient was admitted to undergo surgical decompression of T12-L1 which she tolerated well. During the procedure the pleural cavity was entered. He had a moderate-sized pneumothorax postoperatively but was not having any respiratory distress. This has been observed in the hospital and appears to be improving. The patient is now discharged with a prescription for Percocet as needed for pain. He'll be seen in the office for further follow-up.

## 2017-04-09 NOTE — Progress Notes (Signed)
Patient alert and oriented, mae's well, voiding adequate amount of urine, swallowing without difficulty, no c/o pain at time of discharge. Patient discharged home with family. Script and discharged instructions given to patient. Patient and family stated understanding of instructions given. Patient has an appointment with Dr. Elsner  

## 2017-04-10 DIAGNOSIS — R0602 Shortness of breath: Secondary | ICD-10-CM | POA: Diagnosis not present

## 2017-04-11 ENCOUNTER — Other Ambulatory Visit: Payer: Self-pay | Admitting: Cardiothoracic Surgery

## 2017-04-11 ENCOUNTER — Encounter (HOSPITAL_COMMUNITY)
Admission: AD | Disposition: A | Discharge status: Home / Self Care | Payer: Self-pay | Source: Ambulatory Visit | Attending: Cardiothoracic Surgery

## 2017-04-11 ENCOUNTER — Encounter: Payer: Self-pay | Admitting: Cardiothoracic Surgery

## 2017-04-11 ENCOUNTER — Ambulatory Visit
Admission: RE | Admit: 2017-04-11 | Discharge: 2017-04-11 | Disposition: A | Discharge status: Home / Self Care | Payer: Medicare Other | Source: Ambulatory Visit | Attending: Cardiothoracic Surgery | Admitting: Cardiothoracic Surgery

## 2017-04-11 ENCOUNTER — Encounter (HOSPITAL_COMMUNITY): Payer: Self-pay | Admitting: Certified Registered"

## 2017-04-11 ENCOUNTER — Inpatient Hospital Stay (HOSPITAL_COMMUNITY): Payer: Medicare Other

## 2017-04-11 ENCOUNTER — Institutional Professional Consult (permissible substitution) (INDEPENDENT_AMBULATORY_CARE_PROVIDER_SITE_OTHER): Payer: Medicare Other | Admitting: Cardiothoracic Surgery

## 2017-04-11 ENCOUNTER — Inpatient Hospital Stay (HOSPITAL_COMMUNITY)
Admission: AD | Admit: 2017-04-11 | Discharge: 2017-04-14 | DRG: 201 | Disposition: A | Discharge status: Home / Self Care | Payer: Medicare Other | Source: Ambulatory Visit | Attending: Cardiothoracic Surgery | Admitting: Cardiothoracic Surgery

## 2017-04-11 ENCOUNTER — Inpatient Hospital Stay (HOSPITAL_COMMUNITY): Payer: Medicare Other | Admitting: Certified Registered"

## 2017-04-11 ENCOUNTER — Other Ambulatory Visit: Payer: Self-pay

## 2017-04-11 ENCOUNTER — Other Ambulatory Visit: Payer: Self-pay | Admitting: Physician Assistant

## 2017-04-11 VITALS — BP 126/73 | HR 67 | Ht 74.0 in | Wt 160.0 lb

## 2017-04-11 DIAGNOSIS — Z419 Encounter for procedure for purposes other than remedying health state, unspecified: Secondary | ICD-10-CM

## 2017-04-11 DIAGNOSIS — J9311 Primary spontaneous pneumothorax: Secondary | ICD-10-CM | POA: Diagnosis not present

## 2017-04-11 DIAGNOSIS — Z77098 Contact with and (suspected) exposure to other hazardous, chiefly nonmedicinal, chemicals: Secondary | ICD-10-CM | POA: Diagnosis present

## 2017-04-11 DIAGNOSIS — I482 Chronic atrial fibrillation: Secondary | ICD-10-CM | POA: Diagnosis present

## 2017-04-11 DIAGNOSIS — Z881 Allergy status to other antibiotic agents status: Secondary | ICD-10-CM | POA: Diagnosis not present

## 2017-04-11 DIAGNOSIS — Z7951 Long term (current) use of inhaled steroids: Secondary | ICD-10-CM | POA: Diagnosis not present

## 2017-04-11 DIAGNOSIS — J449 Chronic obstructive pulmonary disease, unspecified: Secondary | ICD-10-CM | POA: Diagnosis present

## 2017-04-11 DIAGNOSIS — M109 Gout, unspecified: Secondary | ICD-10-CM | POA: Diagnosis present

## 2017-04-11 DIAGNOSIS — E785 Hyperlipidemia, unspecified: Secondary | ICD-10-CM | POA: Diagnosis present

## 2017-04-11 DIAGNOSIS — J4530 Mild persistent asthma, uncomplicated: Secondary | ICD-10-CM | POA: Diagnosis present

## 2017-04-11 DIAGNOSIS — Z96651 Presence of right artificial knee joint: Secondary | ICD-10-CM | POA: Diagnosis present

## 2017-04-11 DIAGNOSIS — N400 Enlarged prostate without lower urinary tract symptoms: Secondary | ICD-10-CM | POA: Diagnosis present

## 2017-04-11 DIAGNOSIS — J948 Other specified pleural conditions: Secondary | ICD-10-CM | POA: Diagnosis not present

## 2017-04-11 DIAGNOSIS — I10 Essential (primary) hypertension: Secondary | ICD-10-CM | POA: Diagnosis present

## 2017-04-11 DIAGNOSIS — Z7952 Long term (current) use of systemic steroids: Secondary | ICD-10-CM | POA: Diagnosis not present

## 2017-04-11 DIAGNOSIS — Z7982 Long term (current) use of aspirin: Secondary | ICD-10-CM | POA: Diagnosis not present

## 2017-04-11 DIAGNOSIS — J939 Pneumothorax, unspecified: Secondary | ICD-10-CM

## 2017-04-11 DIAGNOSIS — Z4682 Encounter for fitting and adjustment of non-vascular catheter: Secondary | ICD-10-CM

## 2017-04-11 DIAGNOSIS — J441 Chronic obstructive pulmonary disease with (acute) exacerbation: Secondary | ICD-10-CM

## 2017-04-11 DIAGNOSIS — Z96612 Presence of left artificial shoulder joint: Secondary | ICD-10-CM | POA: Diagnosis present

## 2017-04-11 DIAGNOSIS — Z87891 Personal history of nicotine dependence: Secondary | ICD-10-CM

## 2017-04-11 DIAGNOSIS — Z96643 Presence of artificial hip joint, bilateral: Secondary | ICD-10-CM | POA: Diagnosis present

## 2017-04-11 DIAGNOSIS — Z9689 Presence of other specified functional implants: Secondary | ICD-10-CM

## 2017-04-11 DIAGNOSIS — K219 Gastro-esophageal reflux disease without esophagitis: Secondary | ICD-10-CM | POA: Diagnosis present

## 2017-04-11 DIAGNOSIS — Z885 Allergy status to narcotic agent status: Secondary | ICD-10-CM

## 2017-04-11 DIAGNOSIS — J95811 Postprocedural pneumothorax: Principal | ICD-10-CM | POA: Diagnosis present

## 2017-04-11 DIAGNOSIS — Z981 Arthrodesis status: Secondary | ICD-10-CM

## 2017-04-11 DIAGNOSIS — Z452 Encounter for adjustment and management of vascular access device: Secondary | ICD-10-CM | POA: Diagnosis not present

## 2017-04-11 DIAGNOSIS — R0602 Shortness of breath: Secondary | ICD-10-CM | POA: Diagnosis not present

## 2017-04-11 DIAGNOSIS — I4891 Unspecified atrial fibrillation: Secondary | ICD-10-CM | POA: Diagnosis not present

## 2017-04-11 DIAGNOSIS — I517 Cardiomegaly: Secondary | ICD-10-CM | POA: Diagnosis not present

## 2017-04-11 DIAGNOSIS — J90 Pleural effusion, not elsewhere classified: Secondary | ICD-10-CM | POA: Diagnosis not present

## 2017-04-11 HISTORY — PX: CHEST TUBE INSERTION: SHX231

## 2017-04-11 SURGERY — CHEST TUBE INSERTION
Anesthesia: Monitor Anesthesia Care | Laterality: Left

## 2017-04-11 MED ORDER — DILTIAZEM HCL 30 MG PO TABS: 30.0000 mg | ORAL_TABLET | ORAL | Status: DC | PRN

## 2017-04-11 MED ORDER — DILTIAZEM HCL ER COATED BEADS 180 MG PO CP24: 180.0000 mg | ORAL_CAPSULE | Freq: Every day | ORAL | Status: DC

## 2017-04-11 MED ORDER — PREDNISONE 10 MG PO TABS: 10.0000 mg | ORAL_TABLET | Freq: Every day | ORAL | Status: DC

## 2017-04-11 MED ORDER — SUCCINYLCHOLINE CHLORIDE 200 MG/10ML IV SOSY
PREFILLED_SYRINGE | INTRAVENOUS | Status: AC
  Filled 2017-04-11: qty 10

## 2017-04-11 MED ORDER — ENOXAPARIN SODIUM 40 MG/0.4ML ~~LOC~~ SOLN
40.0000 mg | SUBCUTANEOUS | Status: DC
  Administered 2017-04-12 – 2017-04-13 (×2): 40 mg via SUBCUTANEOUS
  Filled 2017-04-11 (×2): qty 0.4

## 2017-04-11 MED ORDER — ONDANSETRON HCL 4 MG/2ML IJ SOLN: 4.0000 mg | Freq: Four times a day (QID) | INTRAMUSCULAR | Status: DC | PRN

## 2017-04-11 MED ORDER — PANTOPRAZOLE SODIUM 40 MG PO TBEC
40.0000 mg | DELAYED_RELEASE_TABLET | Freq: Two times a day (BID) | ORAL | Status: DC
  Administered 2017-04-12 – 2017-04-14 (×5): 40 mg via ORAL
  Filled 2017-04-11 (×5): qty 1

## 2017-04-11 MED ORDER — BUDESONIDE 0.25 MG/2ML IN SUSP
0.2500 mg | Freq: Two times a day (BID) | RESPIRATORY_TRACT | Status: DC
  Administered 2017-04-11 – 2017-04-13 (×5): 0.25 mg via RESPIRATORY_TRACT
  Filled 2017-04-11 (×5): qty 2

## 2017-04-11 MED ORDER — SODIUM CHLORIDE 0.9% FLUSH: 9.0000 mL | INTRAVENOUS | Status: DC | PRN

## 2017-04-11 MED ORDER — POTASSIUM CHLORIDE 10 MEQ/50ML IV SOLN: 10.0000 meq | Freq: Every day | INTRAVENOUS | Status: DC | PRN

## 2017-04-11 MED ORDER — FENTANYL CITRATE (PF) 100 MCG/2ML IJ SOLN: 25.0000 ug | INTRAMUSCULAR | Status: DC | PRN

## 2017-04-11 MED ORDER — PANTOPRAZOLE SODIUM 40 MG PO TBEC: 40.0000 mg | DELAYED_RELEASE_TABLET | Freq: Every day | ORAL | Status: DC

## 2017-04-11 MED ORDER — DILTIAZEM HCL ER COATED BEADS 180 MG PO CP24
180.0000 mg | ORAL_CAPSULE | Freq: Every day | ORAL | Status: DC
  Administered 2017-04-12 – 2017-04-13 (×2): 180 mg via ORAL
  Filled 2017-04-11 (×2): qty 1

## 2017-04-11 MED ORDER — FENTANYL CITRATE (PF) 250 MCG/5ML IJ SOLN
INTRAMUSCULAR | Status: AC
  Filled 2017-04-11: qty 5

## 2017-04-11 MED ORDER — DIPHENHYDRAMINE HCL 50 MG/ML IJ SOLN: 12.5000 mg | Freq: Four times a day (QID) | INTRAMUSCULAR | Status: DC | PRN

## 2017-04-11 MED ORDER — SENNOSIDES-DOCUSATE SODIUM 8.6-50 MG PO TABS
1.0000 | ORAL_TABLET | Freq: Every day | ORAL | Status: DC
  Administered 2017-04-12 – 2017-04-13 (×2): 1 via ORAL
  Filled 2017-04-11 (×2): qty 1

## 2017-04-11 MED ORDER — TAMSULOSIN HCL 0.4 MG PO CAPS: 0.4000 mg | ORAL_CAPSULE | Freq: Every day | ORAL | Status: DC

## 2017-04-11 MED ORDER — LEVALBUTEROL HCL 0.63 MG/3ML IN NEBU: 0.6300 mg | INHALATION_SOLUTION | Freq: Four times a day (QID) | RESPIRATORY_TRACT | Status: DC | PRN

## 2017-04-11 MED ORDER — DIPHENHYDRAMINE HCL 12.5 MG/5ML PO ELIX
12.5000 mg | ORAL_SOLUTION | Freq: Four times a day (QID) | ORAL | Status: DC | PRN
  Filled 2017-04-11: qty 5

## 2017-04-11 MED ORDER — CYCLOBENZAPRINE HCL 10 MG PO TABS: 10.0000 mg | ORAL_TABLET | Freq: Three times a day (TID) | ORAL | Status: DC | PRN

## 2017-04-11 MED ORDER — LIDOCAINE 2% (20 MG/ML) 5 ML SYRINGE
INTRAMUSCULAR | Status: AC
  Filled 2017-04-11: qty 5

## 2017-04-11 MED ORDER — BUDESONIDE 0.25 MG/2ML IN SUSP: 0.2500 mg | Freq: Two times a day (BID) | RESPIRATORY_TRACT | Status: DC

## 2017-04-11 MED ORDER — NALOXONE HCL 0.4 MG/ML IJ SOLN: 0.4000 mg | INTRAMUSCULAR | Status: DC | PRN

## 2017-04-11 MED ORDER — ACETAMINOPHEN 325 MG PO TABS
650.0000 mg | ORAL_TABLET | Freq: Four times a day (QID) | ORAL | Status: DC | PRN
Start: 2017-04-11 — End: 2017-04-14
  Filled 2017-04-11: qty 2

## 2017-04-11 MED ORDER — LIDOCAINE-EPINEPHRINE 1 %-1:100000 IJ SOLN
INTRAMUSCULAR | Status: DC | PRN
  Administered 2017-04-11: 10 mL

## 2017-04-11 MED ORDER — IRBESARTAN 75 MG PO TABS
37.5000 mg | ORAL_TABLET | Freq: Every day | ORAL | Status: DC
  Administered 2017-04-12 – 2017-04-13 (×2): 37.5 mg via ORAL
  Filled 2017-04-11 (×3): qty 0.5

## 2017-04-11 MED ORDER — TAMSULOSIN HCL 0.4 MG PO CAPS
0.4000 mg | ORAL_CAPSULE | Freq: Every day | ORAL | Status: DC
  Administered 2017-04-12 – 2017-04-13 (×2): 0.4 mg via ORAL
  Filled 2017-04-11 (×2): qty 1

## 2017-04-11 MED ORDER — OXYCODONE HCL 5 MG PO TABS: 5.0000 mg | ORAL_TABLET | Freq: Once | ORAL | Status: DC | PRN

## 2017-04-11 MED ORDER — ALLOPURINOL 100 MG PO TABS
100.0000 mg | ORAL_TABLET | Freq: Every day | ORAL | Status: DC
  Administered 2017-04-12 – 2017-04-13 (×2): 100 mg via ORAL
  Filled 2017-04-11 (×2): qty 1

## 2017-04-11 MED ORDER — PROPOFOL 500 MG/50ML IV EMUL
INTRAVENOUS | Status: DC | PRN
Start: 2017-04-11 — End: 2017-04-11
  Administered 2017-04-11: 75 ug/kg/min via INTRAVENOUS

## 2017-04-11 MED ORDER — CEFUROXIME SODIUM 1.5 G IV SOLR
1.5000 g | Freq: Once | INTRAVENOUS | Status: AC
  Administered 2017-04-11: 1.5 g via INTRAVENOUS
  Filled 2017-04-11: qty 1.5

## 2017-04-11 MED ORDER — ARFORMOTEROL TARTRATE 15 MCG/2ML IN NEBU
15.0000 ug | INHALATION_SOLUTION | Freq: Two times a day (BID) | RESPIRATORY_TRACT | Status: DC
  Administered 2017-04-12 – 2017-04-13 (×4): 15 ug via RESPIRATORY_TRACT
  Filled 2017-04-11 (×5): qty 2

## 2017-04-11 MED ORDER — ISOSORBIDE MONONITRATE ER 30 MG PO TB24: 30.0000 mg | ORAL_TABLET | Freq: Every day | ORAL | Status: DC

## 2017-04-11 MED ORDER — SODIUM CHLORIDE 0.45 % IV SOLN
INTRAVENOUS | Status: DC
  Administered 2017-04-12: 04:00:00 via INTRAVENOUS

## 2017-04-11 MED ORDER — ISOSORBIDE MONONITRATE ER 30 MG PO TB24
30.0000 mg | ORAL_TABLET | Freq: Every day | ORAL | Status: DC
  Administered 2017-04-12 – 2017-04-13 (×2): 30 mg via ORAL
  Filled 2017-04-11 (×2): qty 1

## 2017-04-11 MED ORDER — FENTANYL CITRATE (PF) 250 MCG/5ML IJ SOLN
INTRAMUSCULAR | Status: DC | PRN
  Administered 2017-04-11 (×2): 50 ug via INTRAVENOUS

## 2017-04-11 MED ORDER — DEXTROSE 5 % IV SOLN
INTRAVENOUS | Status: AC
  Filled 2017-04-11: qty 1.5

## 2017-04-11 MED ORDER — TRAMADOL HCL 50 MG PO TABS: 50.0000 mg | ORAL_TABLET | Freq: Four times a day (QID) | ORAL | Status: DC | PRN

## 2017-04-11 MED ORDER — DICLOFENAC SODIUM 1 % TD GEL
2.0000 g | TRANSDERMAL | Status: DC | PRN
  Filled 2017-04-11: qty 100

## 2017-04-11 MED ORDER — OXYCODONE-ACETAMINOPHEN 5-325 MG PO TABS: 1.0000 | ORAL_TABLET | ORAL | Status: DC | PRN

## 2017-04-11 MED ORDER — OXYCODONE HCL 5 MG/5ML PO SOLN: 5.0000 mg | Freq: Once | ORAL | Status: DC | PRN

## 2017-04-11 MED ORDER — LIDOCAINE 2% (20 MG/ML) 5 ML SYRINGE
INTRAMUSCULAR | Status: DC | PRN
  Administered 2017-04-11: 40 mg via INTRAVENOUS

## 2017-04-11 MED ORDER — BUDESONIDE 0.25 MG/2ML IN SUSP
0.2500 mg | Freq: Two times a day (BID) | RESPIRATORY_TRACT | Status: DC
  Filled 2017-04-11 (×2): qty 2

## 2017-04-11 MED ORDER — DILTIAZEM HCL ER COATED BEADS 120 MG PO TB24: 180.0000 mg | ORAL_TABLET | Freq: Every day | ORAL | Status: DC

## 2017-04-11 MED ORDER — PROPOFOL 10 MG/ML IV BOLUS
INTRAVENOUS | Status: AC
  Filled 2017-04-11: qty 20

## 2017-04-11 MED ORDER — LIDOCAINE HCL (PF) 1 % IJ SOLN
INTRAMUSCULAR | Status: AC
  Filled 2017-04-11: qty 30

## 2017-04-11 MED ORDER — ACETAMINOPHEN 160 MG/5ML PO SOLN: 1000.0000 mg | Freq: Four times a day (QID) | ORAL | Status: DC

## 2017-04-11 MED ORDER — ASPIRIN EC 81 MG PO TBEC
81.0000 mg | DELAYED_RELEASE_TABLET | Freq: Every day | ORAL | Status: DC
  Administered 2017-04-12 – 2017-04-13 (×2): 81 mg via ORAL
  Filled 2017-04-11 (×2): qty 1

## 2017-04-11 MED ORDER — DOCUSATE SODIUM 100 MG PO CAPS
100.0000 mg | ORAL_CAPSULE | Freq: Two times a day (BID) | ORAL | Status: DC
  Administered 2017-04-12 – 2017-04-13 (×3): 100 mg via ORAL
  Filled 2017-04-11 (×4): qty 1

## 2017-04-11 MED ORDER — ATORVASTATIN CALCIUM 10 MG PO TABS
10.0000 mg | ORAL_TABLET | Freq: Every day | ORAL | Status: DC
  Administered 2017-04-12 – 2017-04-13 (×2): 10 mg via ORAL
  Filled 2017-04-11: qty 1

## 2017-04-11 MED ORDER — ARFORMOTEROL TARTRATE 15 MCG/2ML IN NEBU: 15.0000 ug | INHALATION_SOLUTION | Freq: Two times a day (BID) | RESPIRATORY_TRACT | Status: DC

## 2017-04-11 MED ORDER — ACETAMINOPHEN 650 MG RE SUPP: 650.0000 mg | Freq: Four times a day (QID) | RECTAL | Status: DC | PRN

## 2017-04-11 MED ORDER — ONDANSETRON HCL 4 MG PO TABS: 4.0000 mg | ORAL_TABLET | Freq: Four times a day (QID) | ORAL | Status: DC | PRN

## 2017-04-11 MED ORDER — BECLOMETHASONE DIPROPIONATE 80 MCG/ACT IN AERS: 2.0000 | INHALATION_SPRAY | Freq: Two times a day (BID) | RESPIRATORY_TRACT | Status: DC

## 2017-04-11 MED ORDER — BISACODYL 5 MG PO TBEC
10.0000 mg | DELAYED_RELEASE_TABLET | Freq: Every day | ORAL | Status: DC
  Administered 2017-04-12: 10 mg via ORAL
  Filled 2017-04-11 (×2): qty 2

## 2017-04-11 MED ORDER — LACTATED RINGERS IV SOLN
INTRAVENOUS | Status: DC | PRN
  Administered 2017-04-11: 19:00:00 via INTRAVENOUS

## 2017-04-11 MED ORDER — ACETAMINOPHEN 500 MG PO TABS
1000.0000 mg | ORAL_TABLET | Freq: Four times a day (QID) | ORAL | Status: DC
  Administered 2017-04-11 – 2017-04-13 (×4): 1000 mg via ORAL
  Filled 2017-04-11 (×4): qty 2

## 2017-04-11 MED ORDER — FENTANYL 40 MCG/ML IV SOLN
INTRAVENOUS | Status: DC
  Administered 2017-04-11: 21:00:00 via INTRAVENOUS
  Administered 2017-04-12: 0 ug via INTRAVENOUS
  Administered 2017-04-12 (×2): 10 ug via INTRAVENOUS
  Administered 2017-04-13 (×3): 0 ug via INTRAVENOUS
  Filled 2017-04-11: qty 25

## 2017-04-11 SURGICAL SUPPLY — 24 items
CANISTER SUCT 3000ML PPV (MISCELLANEOUS) ×2 IMPLANT
CATH THORACIC 28FR (CATHETERS) IMPLANT
CATH THORACIC 36FR (CATHETERS) IMPLANT
COVER BACK TABLE 60X90IN (DRAPES) ×2 IMPLANT
COVER SURGICAL LIGHT HANDLE (MISCELLANEOUS) ×2 IMPLANT
DRAPE CHEST BREAST 15X10 FENES (DRAPES) ×2 IMPLANT
GAUZE SPONGE 4X4 12PLY STRL (GAUZE/BANDAGES/DRESSINGS) ×2 IMPLANT
GAUZE SPONGE 4X4 16PLY XRAY LF (GAUZE/BANDAGES/DRESSINGS) ×2 IMPLANT
GLOVE BIO SURGEON STRL SZ7.5 (GLOVE) ×3 IMPLANT
GOWN STRL REUS W/ TWL LRG LVL3 (GOWN DISPOSABLE) ×2 IMPLANT
GOWN STRL REUS W/TWL LRG LVL3 (GOWN DISPOSABLE) ×4
KIT BASIN OR (CUSTOM PROCEDURE TRAY) ×2 IMPLANT
KIT ROOM TURNOVER OR (KITS) ×2 IMPLANT
PAD ARMBOARD 7.5X6 YLW CONV (MISCELLANEOUS) ×4 IMPLANT
SCALPEL PROTECTED #11 DISP (BLADE) ×1 IMPLANT
SUT SILK  1 MH (SUTURE)
SUT SILK 1 MH (SUTURE) IMPLANT
SYR CONTROL 10ML LL (SYRINGE) ×2 IMPLANT
SYSTEM SAHARA CHEST DRAIN RE-I (WOUND CARE) ×2 IMPLANT
TAPE CLOTH SURG 4X10 WHT LF (GAUZE/BANDAGES/DRESSINGS) ×1 IMPLANT
TOWEL OR 17X26 10 PK STRL BLUE (TOWEL DISPOSABLE) ×2 IMPLANT
TRAP SPECIMEN MUCOUS 40CC (MISCELLANEOUS) ×2 IMPLANT
TRAY CHEST TUBE INSERTION (SET/KITS/TRAYS/PACK) ×2 IMPLANT
TUBE CONNECTING 12X1/4 (SUCTIONS) ×2 IMPLANT

## 2017-04-11 NOTE — Anesthesia Preprocedure Evaluation (Signed)
Anesthesia Evaluation  Patient identified by MRN, date of birth, ID band Patient awake    Reviewed: Allergy & Precautions, NPO status , Patient's Chart, lab work & pertinent test results  Airway Mallampati: I  TM Distance: >3 FB Neck ROM: Full    Dental  (+) Caps, Missing   Pulmonary shortness of breath, COPD,  Left ptx   breath sounds clear to auscultation       Cardiovascular + dysrhythmias Atrial Fibrillation  Rhythm:Regular     Neuro/Psych  Headaches,    GI/Hepatic GERD  Medicated and Controlled,  Endo/Other    Renal/GU      Musculoskeletal   Abdominal   Peds  Hematology   Anesthesia Other Findings   Reproductive/Obstetrics                             Anesthesia Physical Anesthesia Plan  ASA: III  Anesthesia Plan: MAC   Post-op Pain Management:    Induction: Intravenous  PONV Risk Score and Plan: 1  Airway Management Planned: Nasal Cannula  Additional Equipment: None  Intra-op Plan:   Post-operative Plan:   Informed Consent: I have reviewed the patients History and Physical, chart, labs and discussed the procedure including the risks, benefits and alternatives for the proposed anesthesia with the patient or authorized representative who has indicated his/her understanding and acceptance.   Dental advisory given  Plan Discussed with: CRNA and Surgeon  Anesthesia Plan Comments:         Anesthesia Quick Evaluation

## 2017-04-11 NOTE — Transfer of Care (Signed)
Immediate Anesthesia Transfer of Care Note  Patient: Levi [name]  Procedure(s) Performed: CHEST TUBE INSERTION (Left )  Patient Location: PACU  Anesthesia Type:MAC  Level of Consciousness: awake, alert , oriented and patient cooperative  Airway & Oxygen Therapy: Patient Spontanous Breathing and Patient connected to nasal cannula oxygen  Post-op Assessment: Report given to RN, Post -op Vital signs reviewed and stable and Patient moving all extremities  Post vital signs: Reviewed and stable  Last Vitals: There were no vitals filed for this visit.  Last Pain: There were no vitals filed for this visit.       Complications: No apparent anesthesia complications

## 2017-04-11 NOTE — Brief Op Note (Signed)
04/11/2017  7:30 PM  PATIENT:  [name]  71 y.o. male  PRE-OPERATIVE DIAGNOSIS:  left pneumothorax  POST-OPERATIVE DIAGNOSIS:  left pneumothorax  PROCEDURE:  Procedure(s): CHEST TUBE INSERTION (Left) 29 F  SURGEON:  Surgeon(s) and Role:    [name], MD - Primary  PHYSICIAN ASSISTANT:   ASSISTANTS: none   ANESTHESIA:   IV sedation MAC  EBL:  20 mL   BLOOD ADMINISTERED:none  DRAINS:L pleural tube  LOCAL MEDICATIONS USED:  LIDOCAINE  and Amount: 10 ml  SPECIMEN:  No Specimen  DISPOSITION OF SPECIMEN:  N/A  COUNTS:  YES  TOURNIQUET:  * No tourniquets in log *  DICTATION: .Dragon Dictation  PLAN OF CARE: to PACU stable then 2 C room  PATIENT DISPOSITION:  PACU - hemodynamically stable.   Delay start of Pharmacological VTE agent (>24hrs) due to surgical blood loss or risk of bleeding: yes

## 2017-04-11 NOTE — Anesthesia Postprocedure Evaluation (Signed)
Anesthesia Post Note  Patient: [name]  Procedure(s) Performed: CHEST TUBE INSERTION (Left )     Patient location during evaluation: PACU Anesthesia Type: MAC Level of consciousness: awake and alert Pain management: pain level controlled Vital Signs Assessment: post-procedure vital signs reviewed and stable Respiratory status: spontaneous breathing, nonlabored ventilation, respiratory function stable and patient connected to nasal cannula oxygen Cardiovascular status: stable and blood pressure returned to baseline Postop Assessment: no apparent nausea or vomiting Anesthetic complications: no    Last Vitals:  Vitals:   04/11/17 2012 04/11/17 2126  BP: (!) 150/68   Pulse: (!) 53   Resp:  19  Temp: 36.4 C   SpO2: 100% 100%    Last Pain:  Vitals:   04/11/17 2126  TempSrc:   PainSc: 5                  Rahkim Rabalais

## 2017-04-11 NOTE — Progress Notes (Signed)
PCP is Orpah Melter, MD Referring Provider is [name], MD  Chief Complaint  Patient presents with  . New Patient (Initial Visit)    evaluate for Chest Tube for LEFT PTX, CXR done today  Patient examined, most recent chest x-rays personally reviewed and counseled with patient and wife  HPI: 71 year old reformed smoker with COPD, chronic bronchitis followed by Dr. Baird Lyons had lumbar fusion with left anterior approach on November 12.  There was a opening into the left pleural space which was repaired by the neurosurgeon.  Patient did well postop and was discharged home with a small pneumothorax.  Since his return home he has become more short of breath with decreasing exercise tolerance.  Follow-up chest x-rays show now a left 30% hydropneumothorax.  On room air his saturation is 98% but he is not able to tolerate much exercise with his underlying COPD and a new left spontaneous pneumothorax.  The patient is being admitted for chest tube placement in the OR and therapy of his pneumothorax.  Patient has history of chronic atrial fibrillation but is not taking anticoagulation.  His last echocardiogram 5 years ago showed EF of 50%.  Patient has history of hypertension, lipidemia, gout, and allergy to Levaquin.  He is not on home oxygen.  Past Medical History:  Diagnosis Date  . Agent orange exposure 1970's   takes Imdur,Diltiazem, and Atacand daily  . ALLERGIC RHINITIS   . Asthma   . Bruises easily    d/t prednisone daily  . Chronic bronchitis ([place])    "used to get it q yr; last time ?2008" (04/28/2012)  . COPD (chronic obstructive pulmonary disease) ([facility])    agent orange exposure  . Degenerative disk disease    "qwhere" (04/28/2012)  . Diverticulitis   . Dysrhythmia    afib  . Elevated uric acid in blood    takes Allopurinol daily  . Enlarged prostate    but not on any meds  . GERD (gastroesophageal reflux disease)    takes Protonix daily  . H/O hiatal hernia   .  Headache   . History of pneumonia    a. 2010  . Hyperlipidemia    takes Lipitor daily; pt. states he takes a preventive  . Joint pain   . Joint swelling   . PAF (paroxysmal atrial fibrillation) ([place])    a. Dx 04/2012, CHA2DS2VASc = 1 (age);  b. 04/2012 Echo: EF 40-50%, mild MR.  . Personal history of colonic adenomas 02/09/2013  . Shortness of breath dyspnea     Past Surgical History:  Procedure Laterality Date  . ANTERIOR CERVICAL DECOMPRESSION/DISCECTOMY FUSION 2 LEVELS N/A 07/21/2012   Performed by [name], MD at [facility] NEURO ORS  . BACK SURGERY     x 3  . CARDIAC CATHETERIZATION  11/2008   [name] - 20% calcified non flow limiting left main, 50% EF apical hypokinesis  . Cervical six-seven Disc arthroplasty N/A 04/07/2017   Performed by [name], MD at [place]    . COLONOSCOPY    . ESOPHAGOGASTRODUODENOSCOPY    . EYE SURGERY Bilateral 2011   steroidal encapsulation" (04/28/2012)  . FOOT SURGERY Right    x 2  . KNEE ARTHROSCOPY Bilateral   . LATERAL / POSTERIOR COMBINED FUSION LUMBAR SPINE  2012  . Posterior lumbar interbody fusion with revision of Lumbar four-five and removal of rods Lumbar one-Lumbar five with Stealth Navigation and O arm N/A 06/15/2013   Performed by [name],  MD at [facility] NEURO ORS  . REVERSE SHOULDER ARTHROPLASTY Left 04/28/2012   Performed by [name], MD at [place]    . Thoracic twelve-Lumbar one Anterolateral decompression/fusion N/A 04/07/2017   Performed by [name], MD at [place]  . [place]  . TOTAL HIP ARTHROPLASTY Left 2011   "left" (04/28/2012)  . TOTAL HIP ARTHROPLASTY ANTERIOR APPROACH Right 07/31/2015   Performed by [name], MD at [facility] OR  . TOTAL KNEE ARTHROPLASTY Right 06/17/2014   Performed by [name], MD at [place]  . TOTAL KNEE ARTHROPLASTY  01/10/2012   Performed by [name], MD at [facility] OR    Family History  Problem Relation Age  of Onset  . Heart disease Father        Died 74, MI  . Diabetes Mother   . Prostate cancer Brother   . Colon cancer Neg Hx   . Esophageal cancer Neg Hx   . Rectal cancer Neg Hx   . Stomach cancer Neg Hx   . Pancreatic cancer Neg Hx   . Kidney disease Neg Hx   . Liver disease Neg Hx     Social History Social History   Tobacco Use  . Smoking status: Never Smoker  . Smokeless tobacco: Never Used  Substance Use Topics  . Alcohol use: No    Alcohol/week: 0.0 oz  . Drug use: No    Current Outpatient Medications  Medication Sig Dispense Refill  . acetaminophen (TYLENOL) 650 MG CR tablet Take 650 mg by mouth every 8 (eight) hours as needed for pain.    [name] albuterol (PROVENTIL HFA) 108 (90 Base) MCG/ACT inhaler INHALE 2 PUFFS INTO THE LUNGS EVERY 4 (FOUR) HOURS AS NEEDED FOR WHEEZING. 6.7 each 12  . allopurinol (ZYLOPRIM) 100 MG tablet Take 100 mg by mouth daily.      [name] aspirin EC 81 MG tablet Take 1 tablet (81 mg total) by mouth daily. 90 tablet 3  . atorvastatin (LIPITOR) 10 MG tablet TAKE 1 TABLET (10 MG TOTAL) BY MOUTH DAILY. 90 tablet 1  . beclomethasone (QVAR) 80 MCG/ACT inhaler Inhale 2 puffs then rinse mouth, twice daily (Patient taking differently: Inhale 2 puffs into the lungs 2 (two) times daily. ) 1 Inhaler 12  . candesartan (ATACAND) 4 MG tablet TAKE 1/2 TABLETS (2 MG TOTAL) BY MOUTH DAILY. (Patient taking differently: TAKE 1/2 TABLETS (2 MG TOTAL) BY MOUTH DAILY IN THE EVENING) 15 tablet 5  . cyclobenzaprine (FLEXERIL) 10 MG tablet Take 1 tablet (10 mg total) 3 (three) times daily as needed by mouth for muscle spasms. 30 tablet 5  . diclofenac sodium (VOLTAREN) 1 % GEL Apply 2 g topically as needed (for pain).     [name] diltiazem (CARDIZEM CD) 180 MG 24 hr capsule TAKE 1 CAPSULE (180 MG TOTAL) BY MOUTH DAILY. 30 capsule 6  . diltiazem (CARDIZEM) 30 MG tablet Take 1 tablet every 4 hours AS NEEDED for afib HR >100 and BP >100 (Patient taking differently: Take 30 mg by mouth every  4 (four) hours as needed (for AFIB HR > 100 and BP > 100). ) 30 tablet 0  . isosorbide mononitrate (IMDUR) 30 MG 24 hr tablet TAKE 1 TABLET (30 MG TOTAL) BY MOUTH DAILY. 30 tablet 5  . oxyCODONE-acetaminophen (PERCOCET/ROXICET) 5-325 MG tablet Take 1-2 tablets every 4 (four) hours as needed by mouth for severe pain. 30 tablet 0  . pantoprazole (  PROTONIX) 40 MG tablet TAKE 1 TABLET BY MOUTH TWICE A DAY (Patient taking differently: TAKE 40 MG BY MOUTH TWICE A DAY) 60 tablet 5  . predniSONE (DELTASONE) 10 MG tablet TAKE 1 TABLET (10 MG TOTAL) BY MOUTH DAILY. CONTINUOUS 30 tablet 11  . STRIVERDI RESPIMAT 2.5 MCG/ACT AERS INHALE 2 PUFFS INTO THE LUNGS DAILY. 1 Inhaler 6  . tamsulosin (FLOMAX) 0.4 MG CAPS capsule Take 0.4 mg by mouth at bedtime.  11   No current facility-administered medications for this visit.     Allergies  Allergen Reactions  . Morphine And Related Palpitations and Other (See Comments)    Light headed and sweaty; "my heart races" (04/28/2012), pt states too much morphine gave him this reaction  . Levaquin [Levofloxacin] Itching    Review of Systems   Shortness of breath with exertion No falls or syncope No fever No drainage from surgical incisions on his left back No chest pain No ankle edema No change in bowel habits or abdominal pain No bleeding problems No focal weakness  BP 126/73   Pulse 67   Ht _0  (1.88 m)   Wt 160 lb (72.6 kg)   SpO2 98%   BMI 20.54 kg/m  Physical Exam      Exam    General- alert and comfortable    Neck-no JVD adenopathy or bruit or crepitus   Lungs-diminished breath sounds left base otherwise clear without rales, wheezes   Cor- regular rate and rhythm, no murmur , gallop   Abdomen- soft, non-tender   Extremities - warm, non-tender, minimal edema   Neuro- oriented, appropriate, no focal weakness  Diagnostic Tests: Chest x-ray shows 30% left pneumothorax with COPD-emphysema  Impression: Patient is very symptomatic with  shortness of breath with any activity and feels poorly with the left pneumothorax which has not resolved with time.  I recommended he be admitted for left chest tube placement in operating room then observation in the hospital to treat the pneumothorax.  Plan: Patient will be scheduled for left chest tube placement later today.  He has not had anything since breakfast which consisted of a single piece of toast.  Will use local and MAC anesthesia.  He will be admitted to telemetry.  Len Childs, MD [facility] [phone]

## 2017-04-12 ENCOUNTER — Encounter (HOSPITAL_COMMUNITY): Payer: Self-pay | Admitting: Cardiothoracic Surgery

## 2017-04-12 ENCOUNTER — Other Ambulatory Visit: Payer: Self-pay

## 2017-04-12 ENCOUNTER — Inpatient Hospital Stay (HOSPITAL_COMMUNITY): Payer: Medicare Other

## 2017-04-12 LAB — BASIC METABOLIC PANEL
Anion gap: 9 (ref 5–15)
BUN: 13 mg/dL (ref 6–20)
CO2: 25 mmol/L (ref 22–32)
Calcium: 8.7 mg/dL — ABNORMAL LOW (ref 8.9–10.3)
Chloride: 102 mmol/L (ref 101–111)
Creatinine, Ser: 0.7 mg/dL (ref 0.61–1.24)
GFR calc Af Amer: >60 mL/min (ref >60)
GFR calc non Af Amer: >60 mL/min (ref >60)
Glucose, Bld: 88 mg/dL (ref 65–99)
Potassium: 3.4 mmol/L — ABNORMAL LOW (ref 3.5–5.1)
Sodium: 136 mmol/L (ref 135–145)

## 2017-04-12 LAB — CBC
HCT: 36.1 % — ABNORMAL LOW (ref 39.0–52.0)
Hemoglobin: 12.1 g/dL — ABNORMAL LOW (ref 13.0–17.0)
MCH: 30.9 pg (ref 26.0–34.0)
MCHC: 33.5 g/dL (ref 30.0–36.0)
MCV: 92.3 fL (ref 78.0–100.0)
Platelets: 224 10*3/uL (ref 150–400)
RBC: 3.91 MIL/uL — ABNORMAL LOW (ref 4.22–5.81)
RDW: 14.4 % (ref 11.5–15.5)
WBC: 6.5 10*3/uL (ref 4.0–10.5)

## 2017-04-12 MED ORDER — PREDNISONE 10 MG PO TABS
10.0000 mg | ORAL_TABLET | Freq: Every day | ORAL | Status: DC
  Administered 2017-04-12 – 2017-04-14 (×3): 10 mg via ORAL
  Filled 2017-04-12 (×3): qty 1

## 2017-04-12 NOTE — Op Note (Deleted)
  The note originally documented on this encounter has been moved the the encounter in which it belongs.  

## 2017-04-12 NOTE — Op Note (Signed)
NAME:  MAEJOR, ERVEN NO.:  [id number]  MEDICAL RECORD NO.:  [id number]  LOCATION:  3C05C                        FACILITY:  [facility]  PHYSICIAN:  [name], M.D.  DATE OF BIRTH:  1945-08-28  DATE OF PROCEDURE:  04/11/2017 DATE OF DISCHARGE:                              OPERATIVE REPORT   OPERATION:  Placement of left chest tube.  PREOPERATIVE DIAGNOSIS:  30% left pneumothorax, symptomatic with history of chronic obstructive pulmonary disease.  POSTOPERATIVE DIAGNOSIS:  30% left pneumothorax, symptomatic with history of chronic obstructive pulmonary disease.  SURGEON:  [name], MD.  ANESTHESIA:  MAC with IV conscious monitored sedation by anesthesia and local 1% lidocaine.  INDICATIONS FOR PROCEDURE:  The patient was admitted to the hospital with diagnosis of a symptomatic left pneumothorax of 30%, which had not improved over a few days.  With his underlying COPD, he had shortness of breath with activity and limitations in his exercise tolerance.  I examined the patient in the office, reviewed his x-ray, admitted the patient for chest tube placement in the operating room with anesthesia for comfort.  Informed consent was obtained for the procedure.  DESCRIPTION OF PROCEDURE:  The patient was brought from the preop holding area, where the patient was marked and informed consent was signed.  He was placed supine on the operating room table.  IV conscious monitored sedation by anesthesia was started.  The left chest was prepped and draped as a sterile field.  A proper time-out was performed. A small incision was made in the anterior axillary line at the fifth interspace.  Further lidocaine was infiltrated into the intercostal muscle.  The left pleural space was entered with a hemostat and there was an exit of air under pressure.  The 28-French trocar tube was then placed through the incision, directed to the apex of the left pleural space and then  secured to the skin with silk suture.  It was connected to underwater seal Pleur-evac drainage system.  Suction was applied and a sterile dressing was applied to the chest tube site.  A portable chest x-ray in the OR showed the tube in good position with re-expansion of the lung and no residual pneumothorax.     [name], M.D.     PV/MEDQ  D:  04/11/2017  T:  04/12/2017  Job:  [id number]  cc:   [name], M.D.

## 2017-04-12 NOTE — Progress Notes (Addendum)
      [address]       [place] [address]             [phone]      1 Day Post-Op Procedure(s) (LRB): CHEST TUBE INSERTION (Left) Subjective: Worried about not being on the right dose of his prednisone. Having some pain at the chest tube site  Objective: Vital signs in last 24 hours: Temp:  [97.6 F (36.4 C)-97.7 F (36.5 C)] 97.7 F (36.5 C) (11/17 0323) Pulse Rate:  [53-67] 55 (11/17 0323) Cardiac Rhythm: Sinus bradycardia (11/17 0800) Resp:  [11-19] 16 (11/17 0418) BP: (126-157)/(68-78) 151/78 (11/17 0323) SpO2:  [96 %-100 %] 99 % (11/17 0730) Weight:  [160 lb (72.6 kg)-160 lb 0 oz (72.6 kg)] 160 lb 0 oz (72.6 kg) (11/16 1817)     Intake/Output from previous day: 11/16 0701 - 11/17 0700 In: 440 [P.O.:240; I.V.:200] Out: 1170 [Urine:1150; Blood:20] Intake/Output this shift: Total I/O In: -  Out: 400 [Urine:400]  General appearance: alert, cooperative and no distress Heart: regular rate and rhythm, S1, S2 normal, no murmur, click, rub or gallop Lungs: clear to auscultation bilaterally Abdomen: soft, non-tender; bowel sounds normal; no masses,  no organomegaly Extremities: extremities normal, atraumatic, no cyanosis or edema Wound: chest tube in good location  Lab Results: Recent Labs    04/12/17 0402  WBC 6.5  HGB 12.1*  HCT 36.1*  PLT 224   BMET:  Recent Labs    04/12/17 0402  NA 136  K 3.4*  CL 102  CO2 25  GLUCOSE 88  BUN 13  CREATININE 0.70  CALCIUM 8.7*    PT/INR: No results for input(s): LABPROT, INR in the last 72 hours. ABG No results found for: PHART, HCO3, TCO2, ACIDBASEDEF, O2SAT CBG (last 3)  No results for input(s): GLUCAP in the last 72 hours.  Assessment/Plan: S/P Procedure(s) (LRB): CHEST TUBE INSERTION (Left)  1. S/p chest tube for left pneumo. CXR showed: Left-sided chest tube is in place without pneumothorax. Left pleural effusion and basilar airspace disease, likely atelectasis. Will place on  waterseal. No air leak. Repeat CXR in the AM.    LOS: 1 day    [name] 04/12/2017  patient examined and medical record reviewed,agree with above note. Tharon Aquas Trigt III 04/12/2017

## 2017-04-12 NOTE — H&P (Addendum)
[address]       [place], [address]             [phone]        Hagop A Fahl Gary Medical Record #[id number] Date of Birth: April 28, 1946  Referring: [name] MD  primary Care: Orpah Melter, MD  Chief Complaint:   Shortness of breath, fatigue  History of Present Illness:     Patient examined, most recent chest x-rays personally reviewed and counseled with patient and wife   HPI: 71 year old reformed smoker with COPD, chronic bronchitis followed by Dr. Baird Lyons had lumbar fusion with left anterior approach on November 12.  There was a opening into the left pleural space which was repaired by the neurosurgeon.  Patient did well postop and was discharged home with a small pneumothorax.  Since his return home he has become more short of breath with decreasing exercise tolerance.  Follow-up chest x-rays show now a left 30% hydropneumothorax.  On room air his saturation is 98% but he is not able to tolerate much exercise with his underlying COPD and a new left spontaneous pneumothorax.  The patient is being admitted for chest tube placement in the OR and therapy of his pneumothorax.   Patient has history of chronic atrial fibrillation but is not taking anticoagulation.  His last echocardiogram 5 years ago showed EF of 50%.   Patient has history of hypertension, lipidemia, gout, and allergy to Levaquin.  He is not on home oxygen.      Current Activity/ Functional Status: Patient lives at home with his wife   Zubrod Score: At the time of surgery this patient's most appropriate activity status/level should be described as: []     0    Normal activity, no symptoms []     1    Restricted in physical strenuous activity but ambulatory, able to do out light work [x]     2    Ambulatory and capable of self care, unable to do work activities, up and about                 more than 50%  Of the time                            []     3    Only limited self  care, in bed greater than 50% of waking hours []     4    Completely disabled, no self care, confined to bed or chair []     5    Moribund  Past Medical History:  Diagnosis Date  . Agent orange exposure 1970's   takes Imdur,Diltiazem, and Atacand daily  . ALLERGIC RHINITIS   . Asthma   . Bruises easily    d/t prednisone daily  . Chronic bronchitis ([place])    "used to get it q yr; last time ?2008" (04/28/2012)  . COPD (chronic obstructive pulmonary disease) ([facility])    agent orange exposure  . Degenerative disk disease    "qwhere" (04/28/2012)  . Diverticulitis   . Dysrhythmia    afib  . Elevated uric acid in blood    takes Allopurinol daily  . Enlarged prostate    but not on any meds  . GERD (gastroesophageal reflux disease)    takes Protonix daily  . H/O hiatal hernia   . Headache   . History of pneumonia    a. 2010  .  Hyperlipidemia    takes Lipitor daily; pt. states he takes a preventive  . Joint pain   . Joint swelling   . PAF (paroxysmal atrial fibrillation) ([place])    a. Dx 04/2012, CHA2DS2VASc = 1 (age);  b. 04/2012 Echo: EF 40-50%, mild MR.  . Personal history of colonic adenomas 02/09/2013  . Shortness of breath dyspnea     Past Surgical History:  Procedure Laterality Date  . ANTERIOR CERVICAL DECOMPRESSION/DISCECTOMY FUSION 2 LEVELS N/A 07/21/2012   Performed by [name], MD at [facility] NEURO ORS  . BACK SURGERY     x 3  . CARDIAC CATHETERIZATION  11/2008   [name] - 20% calcified non flow limiting left main, 50% EF apical hypokinesis  . Cervical six-seven Disc arthroplasty N/A 04/07/2017   Performed by [name], MD at [place] Left 04/11/2017   Performed by [name], MD at [place]    . COLONOSCOPY    . ESOPHAGOGASTRODUODENOSCOPY    . EYE SURGERY Bilateral 2011   steroidal encapsulation" (04/28/2012)  . FOOT SURGERY Right    x 2  . KNEE ARTHROSCOPY Bilateral   . LATERAL / POSTERIOR COMBINED FUSION LUMBAR  SPINE  2012  . Posterior lumbar interbody fusion with revision of Lumbar four-five and removal of rods Lumbar one-Lumbar five with Stealth Navigation and O arm N/A 06/15/2013   Performed by [name], MD at [facility] NEURO ORS  . REVERSE SHOULDER ARTHROPLASTY Left 04/28/2012   Performed by [name], MD at [facility] [place]    . Thoracic twelve-Lumbar one Anterolateral decompression/fusion N/A 04/07/2017   Performed by [name], MD at [place]  . [place]  . TOTAL HIP ARTHROPLASTY Left 2011   "left" (04/28/2012)  . TOTAL HIP ARTHROPLASTY ANTERIOR APPROACH Right 07/31/2015   Performed by [name], MD at [facility] OR  . TOTAL KNEE ARTHROPLASTY Right 06/17/2014   Performed by [name], MD at [facility]  . TOTAL KNEE ARTHROPLASTY  01/10/2012   Performed by [name], MD at [facility] OR    Social History   Tobacco Use  Smoking Status Never Smoker  Smokeless Tobacco Never Used    Social History   Substance and Sexual Activity  Alcohol Use No  . Alcohol/week: 0.0 oz    Social History   Socioeconomic History  . Marital status: Married    Spouse name: Not on file  . Number of children: Not on file  . Years of education: Not on file  . Highest education level: Not on file  Social Needs  . Financial resource strain: Not on file  . Food insecurity - worry: Not on file  . Food insecurity - inability: Not on file  . Transportation needs - medical: Not on file  . Transportation needs - non-medical: Not on file  Occupational History  . 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 Smoking status: Never Smoker  . Smokeless tobacco: Never Used  Substance and Sexual Activity  . Alcohol use: No    Alcohol/week: 0.0 oz  . Drug use: No  . Sexual activity: Yes  Other Topics Concern  . 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 Morphine And Related Palpitations and Other (See Comments)    Light headed and sweaty; "my heart races" (04/28/2012), pt states too much morphine gave him this reaction  . Levaquin [Levofloxacin] Itching    Current Facility-Administered Medications  Medication Dose Route Frequency Provider Last Rate Last Dose  . 0.45 % sodium chloride infusion   Intravenous Continuous Barrett, Erin R, PA-C 10 mL/hr at 04/12/17 0422    . acetaminophen (TYLENOL) tablet 1,000 mg  1,000 mg Oral Q6H Barrett, Erin R, PA-C   1,000 mg at 04/12/17 0605   Or  . acetaminophen (TYLENOL) solution 1,000 mg  1,000 mg Oral Q6H Barrett, Erin R, PA-C      . acetaminophen (TYLENOL) tablet 650 mg  650 mg Oral Q6H PRN Barrett, Erin R, PA-C       Or  . acetaminophen (TYLENOL) suppository 650 mg  650 mg Rectal Q6H PRN Barrett, Erin R, PA-C      . allopurinol (ZYLOPRIM) tablet 100 mg  100 mg Oral Daily Barrett, Erin R, PA-C   100 mg at 04/12/17 0925  . arformoterol (BROVANA) nebulizer solution 15 mcg  15 mcg Nebulization BID [name], MD   15 mcg at 04/12/17 0730  . aspirin EC tablet 81 mg  81 mg Oral Daily Barrett, Erin R, PA-C   81 mg at 04/12/17 0925  . atorvastatin (LIPITOR) tablet 10 mg  10 mg Oral q1800 Barrett, Erin R, PA-C      . bisacodyl (DULCOLAX) EC tablet 10 mg  10 mg Oral Daily Barrett, Erin R, PA-C   10 mg at 04/12/17 0926  . budesonide (PULMICORT) nebulizer solution 0.25 mg  0.25 mg Nebulization BID Prescott Gum, Collier Salina, MD   0.25 mg at 04/12/17 2130  . cyclobenzaprine (FLEXERIL) tablet 10 mg  10 mg Oral TID PRN [name], MD      . diclofenac sodium (VOLTAREN) 1 % transdermal gel 2 g  2 g Topical PRN Prescott Gum, Collier Salina, MD      . diltiazem (CARDIZEM CD) 24 hr capsule 180 mg  180 mg Oral Daily Prescott Gum, Collier Salina, MD   180 mg at 04/12/17 0925  .  diltiazem (CARDIZEM) tablet 30 mg  30 mg Oral Q4H PRN Barrett, Erin R, PA-C      . diphenhydrAMINE (BENADRYL) injection 12.5 mg  12.5 mg Intravenous Q6H PRN Barrett, Erin R, PA-C       Or  . diphenhydrAMINE (BENADRYL) 12.5 MG/5ML elixir 12.5 mg  12.5 mg Oral Q6H PRN Barrett, Erin R, PA-C      . docusate sodium (COLACE) capsule 100 mg  100 mg Oral BID Barrett, Erin R, PA-C   100 mg at 04/12/17 0924  . enoxaparin (LOVENOX) injection 40 mg  40 mg Subcutaneous Q24H Barrett, Erin R, PA-C   40 mg at 04/12/17 0926  . fentaNYL 40 mcg/mL PCA injection   Intravenous Q4H Barrett, Erin R, PA-C      . irbesartan (AVAPRO) tablet 37.5 mg  37.5 mg Oral Daily Prescott Gum, Collier Salina, MD   37.5 mg at 04/12/17 0925  . isosorbide mononitrate (IMDUR) 24 hr tablet 30 mg  30 mg Oral Daily Barrett, Erin R, PA-C   30 mg at 04/12/17 0924  . levalbuterol (XOPENEX) nebulizer solution 0.63 mg  0.63 mg Nebulization Q6H PRN Barrett, Erin R, PA-C      . naloxone (NARCAN)  injection 0.4 mg  0.4 mg Intravenous PRN Barrett, Erin R, PA-C       And  . sodium chloride flush (NS) 0.9 % injection 9 mL  9 mL Intravenous PRN Barrett, Erin R, PA-C      . ondansetron (ZOFRAN) tablet 4 mg  4 mg Oral Q6H PRN Barrett, Erin R, PA-C       Or  . ondansetron (ZOFRAN) injection 4 mg  4 mg Intravenous Q6H PRN Barrett, Erin R, PA-C      . ondansetron (ZOFRAN) injection 4 mg  4 mg Intravenous Q6H PRN Barrett, Erin R, PA-C      . oxyCODONE-acetaminophen (PERCOCET/ROXICET) 5-325 MG per tablet 1-2 tablet  1-2 tablet Oral Q4H PRN Barrett, Erin R, PA-C      . pantoprazole (PROTONIX) EC tablet 40 mg  40 mg Oral BID AC Prescott Gum, Collier Salina, MD   40 mg at 04/12/17 2353  . potassium chloride 10 mEq in 50 mL *CENTRAL LINE* IVPB  10 mEq Intravenous Daily PRN Barrett, Erin R, PA-C      . predniSONE (DELTASONE) tablet 10 mg  10 mg Oral QAC breakfast Prescott Gum, Collier Salina, MD   10 mg at 04/12/17 1247  . senna-docusate (Senokot-S) tablet 1 tablet  1 tablet Oral QHS Barrett, Erin  R, PA-C      . tamsulosin (FLOMAX) capsule 0.4 mg  0.4 mg Oral Daily Barrett, Erin R, PA-C   0.4 mg at 04/12/17 0925  . traMADol (ULTRAM) tablet 50-100 mg  50-100 mg Oral Q6H PRN Barrett, Erin R, PA-C        Medications Prior to Admission  Medication Sig Dispense Refill Last Dose  . acetaminophen (TYLENOL) 650 MG CR tablet Take 650 mg by mouth every 8 (eight) hours as needed for pain.   couple weeks ago  . albuterol (PROVENTIL HFA) 108 (90 Base) MCG/ACT inhaler INHALE 2 PUFFS INTO THE LUNGS EVERY 4 (FOUR) HOURS AS NEEDED FOR WHEEZING. (Patient taking differently: Inhale 2 puffs every 4 (four) hours as needed into the lungs for wheezing. ) 6.7 each 12 04/09/2017  . allopurinol (ZYLOPRIM) 100 MG tablet Take 100 mg by mouth daily.     04/11/2017 at am  . aspirin EC 81 MG tablet Take 1 tablet (81 mg total) by mouth daily. 90 tablet 3 04/11/2017 at am  . atorvastatin (LIPITOR) 10 MG tablet TAKE 1 TABLET (10 MG TOTAL) BY MOUTH DAILY. 90 tablet 1 04/11/2017 at am  . beclomethasone (QVAR) 80 MCG/ACT inhaler Inhale 2 puffs then rinse mouth, twice daily (Patient taking differently: Inhale 2 puffs into the lungs 2 (two) times daily. ) 1 Inhaler 12 04/11/2017 at am  . candesartan (ATACAND) 4 MG tablet TAKE 1/2 TABLETS (2 MG TOTAL) BY MOUTH DAILY. (Patient taking differently: TAKE 1/2 TABLETS (2 MG TOTAL) BY MOUTH DAILY WITH SUPPER) 15 tablet 5 04/10/2017 at pm  . cyclobenzaprine (FLEXERIL) 10 MG tablet Take 1 tablet (10 mg total) 3 (three) times daily as needed by mouth for muscle spasms. 30 tablet 5 week ago  . diclofenac sodium (VOLTAREN) 1 % GEL Apply 2 g daily as needed topically (pain).    several weeks ago  . diltiazem (CARDIZEM CD) 180 MG 24 hr capsule TAKE 1 CAPSULE (180 MG TOTAL) BY MOUTH DAILY. 30 capsule 6 04/11/2017 at am  . diltiazem (CARDIZEM) 30 MG tablet Take 1 tablet every 4 hours AS NEEDED for afib HR >100 and BP >100 (Patient taking differently: Take 30 mg by mouth  every 4 (four) hours as  needed (for AFIB HR > 100 and BP > 100). ) 30 tablet 0 several months ago  . isosorbide mononitrate (IMDUR) 30 MG 24 hr tablet TAKE 1 TABLET (30 MG TOTAL) BY MOUTH DAILY. 30 tablet 5 04/11/2017 at am  . oxyCODONE-acetaminophen (PERCOCET/ROXICET) 5-325 MG tablet Take 1-2 tablets every 4 (four) hours as needed by mouth for severe pain. 30 tablet 0 not yet taken  . pantoprazole (PROTONIX) 40 MG tablet TAKE 1 TABLET BY MOUTH TWICE A DAY (Patient taking differently: TAKE 1 TABLET (40 MG) BY MOUTH TWICE DAILY) 60 tablet 5 04/11/2017 at am  . predniSONE (DELTASONE) 10 MG tablet TAKE 1 TABLET (10 MG TOTAL) BY MOUTH DAILY. CONTINUOUS 30 tablet 11 04/11/2017 at am  . STRIVERDI RESPIMAT 2.5 MCG/ACT AERS INHALE 2 PUFFS INTO THE LUNGS DAILY. 1 Inhaler 6 04/11/2017 at am  . tamsulosin (FLOMAX) 0.4 MG CAPS capsule Take 0.4 mg daily with supper by mouth.   11 04/10/2017 at pm    Family History  Problem Relation Age of Onset  . Heart disease Father        Died 74, MI  . Diabetes Mother   . Prostate cancer Brother   . Colon cancer Neg Hx   . Esophageal cancer Neg Hx   . Rectal cancer Neg Hx   . Stomach cancer Neg Hx   . Pancreatic cancer Neg Hx   . Kidney disease Neg Hx   . Liver disease Neg Hx      Review of Systems:       Cardiac Review of Systems: Y or N  Chest Pain [   n ]  Resting SOB [ n  ] Exertional SOB  [ y ]  Orthopnea [ y ]   Pedal Edema [  n ]    Palpitations [nn  ] Syncope  [ n ]   Presyncope [  n ]  General Review of Systems: [Y] = yes [  ]=no Constitional: recent weight change [  ]; anorexia [  ]; fatigue [ y ]; nausea [  ]; night sweats [  ]; fever [  ]; or chills [  ]                                                               Dental: poor dentition[  ]; Last Dentist visit:   Eye : blurred vision [  ]; diplopia [   ]; vision changes [  ];  Amaurosis fugax[  ]; Resp: cough [  ];  wheezing[  ];  hemoptysis[  ]; shortness of breath[  ]; paroxysmal nocturnal dyspnea[  ]; dyspnea on  exertion[ y ]; or orthopnea[  ];  GI:  gallstones[  ], vomiting[  ];  dysphagia[  ]; melena[  ];  hematochezia [  ]; heartburn[  ];   Hx of  Colonoscopy[  ]; GU: kidney stones [  ]; hematuria[  ];   dysuria [  ];  nocturia[  ];  history of     obstruction [  ]; urinary frequency [  ]             Skin: rash, swelling[  ];, hair loss[  ];  peripheral edema[  ];  or itching[  ];  Musculosketetal: myalgias[  ];  joint swelling[  ];  joint erythema[  ];  joint pain[  ];  back pain[ y ];  Heme/Lymph: bruising[  ];  bleeding[  ];  anemia[  ];  Neuro: TIA[  ];  headaches[  ];  stroke[  ];  vertigo[  ];  seizures[  ];   paresthesias[  ];  difficulty walking[  ];  Psych:depression[  ]; anxiety[  ];  Endocrine: diabetes[  ];  thyroid dysfunction[  ];  Immunizations: Flu [  ]; Pneumococcal[  ];  Other:  Physical Exam: BP 126/76 (BP Location: Right Arm)   Pulse 66   Temp 98 F (36.7 C) (Oral)   Resp (!) 21   Ht 6' 2.02" (1.88 m)   Wt 160 lb 0 oz (72.6 kg)   SpO2 99%   BMI 20.53 kg/m         Exam    General- alert and comfortable   Lungs diminished breath sounds on left without rales, wheezes   Cor- regular rate and rhythm, no murmur , gallop   Abdomen- soft, non-tender   Extremities - warm, non-tender, minimal edema   Neuro- oriented, appropriate, no focal weakness   Diagnostic Studies & Laboratory data:     Recent Radiology Findings:   Dg Chest 2 View  Result Date: 04/11/2017 CLINICAL DATA:  Pneumothorax. EXAM: CHEST  2 VIEW COMPARISON:  Yesterday FINDINGS: Moderate left hydropneumothorax. The gas component is estimated at 30%. Fluid component is small volume. The right lung is clear. Normal heart size and mediastinal contours. Left chest wall emphysema is borderline decreased from 04/09/2017 when it was covered. Spinal and left glenohumeral hardware. IMPRESSION: Unchanged moderate left hydropneumothorax as described. Electronically Signed   By: Monte Fantasia M.D.   On: 04/11/2017  13:00   Portable Chest 1 View  Result Date: 04/12/2017 CLINICAL DATA:  Left-sided pneumothorax. EXAM: PORTABLE CHEST 1 VIEW COMPARISON:  One-view chest 04/11/2017. FINDINGS: The heart is enlarged. A left-sided chest tube is in place. There is no significant pneumothorax. Left pleural effusion remains. Left basilar airspace disease likely reflects atelectasis, unchanged. The right lung is clear. Postsurgical changes of the cervical and thoracic lumbar spine are again noted. Left shoulder replacement is noted. IMPRESSION: 1. Left-sided chest tube is in place without pneumothorax. 2. Left pleural effusion and basilar airspace disease, likely atelectasis. Electronically Signed   By: San Morelle M.D.   On: 04/12/2017 06:57   Dg Chest Portable 1 View  Result Date: 04/11/2017 CLINICAL DATA:  71 year old male status post chest tube placement. EXAM: PORTABLE CHEST 1 VIEW COMPARISON:  Chest radiograph dated 04/11/2017 FINDINGS: A left-sided chest tube is seen with tip along the medial left upper lobe pleural. There has been interval resolution of the previously seen left-sided pneumothorax. No detectable pneumothorax seen on this radiograph. There are linear atelectasis/scarring in the left mid lung field. Left lung base density may represent atelectatic changes or infiltrate. The right lung is clear. Borderline cardiomegaly. Cervical fusion plate and partially visualized left shoulder arthroplasty. No acute osseous pathology. IMPRESSION: Interval placement of a left-sided chest tube with tip along the medial left upper lobe pleural surface. No pneumothorax seen. Left lung base atelectasis versus infiltrate. Electronically Signed   By: Anner Crete M.D.   On: 04/11/2017 20:42     I have independently reviewed the above radiologic studies.  Recent Lab Findings: Lab Results  Component Value Date   WBC 6.5 04/12/2017   HGB 12.1 (L) 04/12/2017  HCT 36.1 (L) 04/12/2017   PLT 224 04/12/2017    GLUCOSE 88 04/12/2017   CHOL 168 10/20/2014   TRIG 60.0 10/20/2014   HDL 92.50 10/20/2014   LDLCALC 64 10/20/2014   ALT 20 07/25/2015   AST 27 07/25/2015   NA 136 04/12/2017   K 3.4 (L) 04/12/2017   CL 102 04/12/2017   CREATININE 0.70 04/12/2017   BUN 13 04/12/2017   CO2 25 04/12/2017   TSH 1.979 05/10/2012   INR 0.98 07/25/2015      Assessment / Plan:   Persistent post procedural left pneumothorax after anterior approach for left lumbar fusion The patient has underlying COPD and remains quite symptomatic with a 30% left apical pneumothorax  Patient will be admitted for chest tube placement in the operating room under IV sedation with monitored anesthesia    @ME1 @ 04/12/2017 3:57 PM

## 2017-04-13 ENCOUNTER — Inpatient Hospital Stay (HOSPITAL_COMMUNITY): Payer: Medicare Other

## 2017-04-13 NOTE — Discharge Summary (Signed)
Physician Discharge Summary  Patient ID: Levi [name] MRN: [id number] DOB/AGE: 1945/06/05 71 y.o.  Admit date: 04/11/2017 Discharge date: 04/14/2017  Admission Diagnoses: Patient Active Problem List   Diagnosis Date Noted  . Pneumothorax, left 04/11/2017  . Pneumothorax on left 04/11/2017  . Lumbar stenosis with neurogenic claudication 04/07/2017  . Primary osteoarthritis of right hip 07/31/2015  . Primary osteoarthritis of right knee 06/17/2014  . Thrush of mouth and esophagus ([place]) 04/03/2014  . Palpitation 03/28/2014  . Pseudoarthrosis of lumbar spine 06/15/2013  . Hyperlipidemia   . History of colonic polyps 02/09/2013  . Atrial fibrillation ([place]) 05/11/2012  . Osteoarthritis of left knee 01/10/2012  . Seasonal and perennial allergic rhinitis 07/23/2010  . Asthma, mild persistent 03/06/2009    Discharge Diagnoses:  Active Problems:   Pneumothorax, left   Pneumothorax on left   Discharged Condition: stable  HPI:  71 year old reformed smoker with COPD, chronic bronchitis followed by Dr. Baird Lyons had lumbar fusion with left anterior approach on November 12. There was a opening into the left pleural space which was repaired by the neurosurgeon. Patient did well postop and was discharged home with a small pneumothorax. Since his return home he has become more short of breath with decreasing exercise tolerance. Follow-up chest x-rays show now a left 30% hydropneumothorax. On room air his saturation is 98% but he is not able to tolerate much exercise with his underlying COPD and a new left spontaneous pneumothorax. The patient is being admitted for chest tube placement in the OR and therapy of his pneumothorax.  Patient has history of chronic atrial fibrillation but is not taking anticoagulation. His last echocardiogram 5 years ago showed EF of 50%.  Patient has history of hypertension, lipidemia, gout, and allergy to Levaquin. He is not on home  oxygen.   Hospital Course: Mr. Molstad underwent a chest tube placement on 11/16 for a pneumothorax. POD 1 the chest xray did not show a pneumothorax therefore it was switched to water seal. He remained stable POD 2 and the chest xray remained stable therefore the chest tube was removed. POD 3 the chest xray remained stable therefore the patient was safe to discharge.   Consults:none  Significant Diagnostic Studies:  CLINICAL DATA:  Chest tube in place.  EXAM: PORTABLE CHEST 1 VIEW  COMPARISON:  One-view chest x-ray 04/12/2017.  FINDINGS: Heart size is upper limits of normal. Left-sided chest tube remains in place. Aeration of the left base is slightly improved. A small left pleural effusion remains. Mild residual airspace disease likely reflects atelectasis. The right lung is clear.  IMPRESSION: 1. Stable left-sided chest tube without pneumothorax. 2. Decreasing left pleural effusion. 3. Improving aeration at the left base with some residual atelectasis.   Electronically Signed   By: San Morelle M.D.   On: 04/13/2017 08:33  Treatments: 04/11/2017  7:30 PM  PATIENT:  Levi [name]  71 y.o. male  PRE-OPERATIVE DIAGNOSIS:  left pneumothorax  POST-OPERATIVE DIAGNOSIS:  left pneumothorax  PROCEDURE:  Procedure(s): CHEST TUBE INSERTION (Left) 29 F  SURGEON:  Surgeon(s) and Role:    [name], MD - Primary  PHYSICIAN ASSISTANT:   ASSISTANTS: none   ANESTHESIA:   IV sedation MAC  EBL:  20 mL   BLOOD ADMINISTERED:none  DRAINS:L pleural tube  LOCAL MEDICATIONS USED:  LIDOCAINE  and Amount: 10 ml  SPECIMEN:  No Specimen  DISPOSITION OF SPECIMEN:  N/A  COUNTS:  YES  TOURNIQUET:  * No tourniquets in log *  DICTATION: .Sales executive  PLAN OF CARE: to PACU stable then 2 C room  PATIENT DISPOSITION:  PACU - hemodynamically stable.   Delay start of Pharmacological VTE agent (>24hrs) due to surgical blood loss  or risk of bleeding: yes   Disposition: 01-Home or Self Care   Discharge Medications:   Allergies as of 04/14/2017      Reactions   Morphine And Related Palpitations, Other (See Comments)   Light headed and sweaty; "my heart races" (04/28/2012), pt states too much morphine gave him this reaction   Levaquin [levofloxacin] Itching      Medication List    TAKE these medications   acetaminophen 650 MG CR tablet Commonly known as:  TYLENOL Take 650 mg by mouth every 8 (eight) hours as needed for pain.   albuterol 108 (90 Base) MCG/ACT inhaler Commonly known as:  PROVENTIL HFA INHALE 2 PUFFS INTO THE LUNGS EVERY 4 (FOUR) HOURS AS NEEDED FOR WHEEZING. What changed:    how much to take  how to take this  when to take this  reasons to take this  additional instructions   allopurinol 100 MG tablet Commonly known as:  ZYLOPRIM Take 100 mg by mouth daily.   aspirin EC 81 MG tablet Take 1 tablet (81 mg total) by mouth daily.   atorvastatin 10 MG tablet Commonly known as:  LIPITOR TAKE 1 TABLET (10 MG TOTAL) BY MOUTH DAILY.   beclomethasone 80 MCG/ACT inhaler Commonly known as:  QVAR Inhale 2 puffs then rinse mouth, twice daily What changed:    how much to take  how to take this  when to take this  additional instructions   candesartan 4 MG tablet Commonly known as:  ATACAND TAKE 1/2 TABLETS (2 MG TOTAL) BY MOUTH DAILY. What changed:  See the new instructions.   cyclobenzaprine 10 MG tablet Commonly known as:  FLEXERIL Take 1 tablet (10 mg total) 3 (three) times daily as needed by mouth for muscle spasms.   diclofenac sodium 1 % Gel Commonly known as:  VOLTAREN Apply 2 g daily as needed topically (pain).   diltiazem 180 MG 24 hr capsule Commonly known as:  CARDIZEM CD TAKE 1 CAPSULE (180 MG TOTAL) BY MOUTH DAILY.   diltiazem 30 MG tablet Commonly known as:  CARDIZEM Take 1 tablet every 4 hours AS NEEDED for afib HR >100 and BP >100 What changed:     how much to take  how to take this  when to take this  reasons to take this  additional instructions   isosorbide mononitrate 30 MG 24 hr tablet Commonly known as:  IMDUR TAKE 1 TABLET (30 MG TOTAL) BY MOUTH DAILY.   oxyCODONE-acetaminophen 5-325 MG tablet Commonly known as:  PERCOCET/ROXICET Take 1-2 tablets every 4 (four) hours as needed by mouth for severe pain.   pantoprazole 40 MG tablet Commonly known as:  PROTONIX TAKE 1 TABLET BY MOUTH TWICE A DAY What changed:    how much to take  how to take this  when to take this   predniSONE 10 MG tablet Commonly known as:  DELTASONE TAKE 1 TABLET (10 MG TOTAL) BY MOUTH DAILY. CONTINUOUS   STRIVERDI RESPIMAT 2.5 MCG/ACT Aers Generic drug:  Olodaterol HCl INHALE 2 PUFFS INTO THE LUNGS DAILY.   tamsulosin 0.4 MG Caps capsule Commonly known as:  FLOMAX Take 0.4 mg daily with supper by mouth.      Follow-up Information    [facility] Follow  up on 04/28/2017.   Specialty:  Cardiothoracic Surgery Why:  Appointment is at 1:30, Please get CXR at 1:00 at [place] located on first floor of our office building Contact information: [place], [place] [phone]          Signed: Ellwood Handler 04/14/2017, 9:14 AM

## 2017-04-13 NOTE — Discharge Instructions (Signed)
Chest Tube Insertion, Adult °A chest tube is a thin, flexible tube that is inserted into the space between your lung and your chest wall. You may need a chest tube if you have a collapsed lung from illness or a severe injury. A collapsed lung can be caused by: °· An air leak (pneumothorax). °· Blood collection (hemothorax). °· Fluid buildup from an infection (empyema). ° °The chest tube drains the fluid or air from your lung. It may be attached to a suction device to help with drainage. You will need to stay in the hospital while the chest tube is in place. °Tell a health care provider about: °· Any allergies you have. °· All medicines you are taking, including vitamins, herbs, eye drops, creams, and over-the-counter medicines. °· Any problems you or family members have had with anesthetic medicines. °· Any blood disorders you have. °· Any surgeries you have had. °· Any medical conditions you have, including any recent fever or cold symptoms. °· Whether you are pregnant or may be pregnant. °What are the risks? °Generally, this is a safe procedure. However, problems may occur, including: °· Bleeding. °· Infection. °· Allergic reaction to medicines. °· Lung damage. °· Damage to the blood vessels or nerves near the lung. °· Failure of the chest tube to work properly. ° °What happens before the procedure? °Staying hydrated °Follow instructions from your health care provider about hydration, which may include: °· Up to 2 hours before the procedure - you may continue to drink clear liquids, such as water, clear fruit juice, black coffee, and plain tea. ° °Eating and drinking restrictions °Follow instructions from your health care provider about eating and drinking, which may include: °· 8 hours before the procedure - stop eating heavy meals or foods such as meat, fried foods, or fatty foods. °· 6 hours before the procedure - stop eating light meals or foods, such as toast or cereal. °· 6 hours before the procedure - stop  drinking milk or drinks that contain milk. °· 2 hours before the procedure - stop drinking clear liquids. ° °Medicines °· Ask your health care provider about: °? Changing or stopping your regular medicines. This is especially important if you are taking diabetes medicines or blood thinners. °? Taking medicines such as aspirin and ibuprofen. These medicines can thin your blood. Do not take these medicines before your procedure if your health care provider instructs you not to. °General instructions °· You will have a chest X-ray or other imaging studies of the lung. °· Plan to have someone take you home from the hospital or clinic. °· If you will be going home right after the procedure, plan to have someone with you for 24 hours. °What happens during the procedure? °· To reduce your risk of infection: °? Your health care team will wash or sanitize their hands. °? Your skin will be washed with soap. °? Hair may be removed from the surgical area. °· An IV tube will be inserted into one of your veins. °· You will be given one or more of the following: °? A medicine to help you relax (sedative). °? A medicine to numb the area (local anesthetic). °· You may be given antibiotic and pain medicines through the IV tube. °· The surgeon will make a small incision in a space between your ribs. °· The chest tube will be placed through the incision into the space between the lung and your chest wall. °· Stitches (sutures) will be used to close   the incision around the tube. °· The chest tube may be attached to a suction device. °· The incision site will be covered with an airtight bandage (dressing). °· Another chest X-ray will be done to check the position of the tube. °The procedure may vary among health care providers and hospitals. °What happens after the procedure? °· Your blood pressure, heart rate, breathing rate, and blood oxygen level will be monitored until the medicines you were given have worn off. °· You may continue  to get pain medicine or antibiotics through the IV tube. °· The tube and dressing will be checked regularly. °· You will be encouraged to cough and take deep breaths. °· You may be given oxygen to breathe. °· Chest X-rays will be done to find out if the lung is inflating. After the lung is inflated: °? Another chest X-ray may be done. °? The chest tube can be removed after the lung remains inflated and you are breathing easily. °? A new dressing will be put on. °· Do not drive for 24 hours if you were given a sedative. °This information is not intended to replace advice given to you by your health care provider. Make sure you discuss any questions you have with your health care provider. °Document Released: 08/21/2006 Document Revised: 12/01/2015 Document Reviewed: 10/25/2015 °Elsevier Interactive Patient Education © 2018 [employer] ° °

## 2017-04-13 NOTE — Progress Notes (Signed)
Fentanyl 25cc PCA pump wasted onto the sink. Witnessed by [employer]

## 2017-04-13 NOTE — Progress Notes (Addendum)
      [address]       [place] [address]             [phone]      2 Days Post-Op Procedure(s) (LRB): CHEST TUBE INSERTION (Left) Subjective: Feels good today. No shortness of breath.   Objective: Vital signs in last 24 hours: Temp:  [97.6 F (36.4 C)-98.2 F (36.8 C)] 97.6 F (36.4 C) (11/18 0818) Pulse Rate:  [52-66] 57 (11/18 0818) Cardiac Rhythm: Sinus bradycardia (11/18 0820) Resp:  [14-21] 14 (11/18 0820) BP: (126-156)/(75-83) 147/83 (11/18 0818) SpO2:  [99 %-100 %] 99 % (11/18 0820)     Intake/Output from previous day: 11/17 0701 - 11/18 0700 In: -  Out: 1100 [Urine:1100] Intake/Output this shift: Total I/O In: -  Out: 90 [Chest Tube:90]  General appearance: alert, cooperative and no distress Heart: regular rate and rhythm, S1, S2 normal, no murmur, click, rub or gallop Lungs: clear to auscultation bilaterally Abdomen: soft, non-tender; bowel sounds normal; no masses,  no organomegaly Extremities: small amount of leg lower leg edema Wound: chest tube in good position  Lab Results: Recent Labs    04/12/17 0402  WBC 6.5  HGB 12.1*  HCT 36.1*  PLT 224   BMET:  Recent Labs    04/12/17 0402  NA 136  K 3.4*  CL 102  CO2 25  GLUCOSE 88  BUN 13  CREATININE 0.70  CALCIUM 8.7*    PT/INR: No results for input(s): LABPROT, INR in the last 72 hours. ABG No results found for: PHART, HCO3, TCO2, ACIDBASEDEF, O2SAT CBG (last 3)  No results for input(s): GLUCAP in the last 72 hours.  Assessment/Plan: S/P Procedure(s) (LRB): CHEST TUBE INSERTION (Left)  1. S/p chest tube for left pneumo. CXR showed: stable left-sided chest tube without pneumothorax. Decreasing left pleural effusion. Improving aeration at the base with some residual atelectasis.  No air leak.   Plan: Discontinue chest tube. CXR in the AM and if okay will discharge tomorrow.      LOS: 2 days    [name] 04/13/2017  patient examined , CXR  and  medical record reviewed,agree with above note. Tharon Aquas Trigt III 04/13/2017

## 2017-04-14 ENCOUNTER — Inpatient Hospital Stay (HOSPITAL_COMMUNITY): Payer: Medicare Other

## 2017-04-14 NOTE — Progress Notes (Signed)
Patient discharged home with wife, IVs removed, and patient insisted on walking out independently. Patient belongings and paperwork sent home with patient.

## 2017-04-14 NOTE — Progress Notes (Signed)
      [address]       [place] [address]             [phone]      3 Days Post-Op Procedure(s) (LRB): CHEST TUBE INSERTION (Left)   Subjective:  No new complaints.  Objective: Vital signs in last 24 hours: Temp:  [97.2 F (36.2 C)-98 F (36.7 C)] 97.2 F (36.2 C) (11/19 0749) Pulse Rate:  [62-71] 67 (11/19 0300) Cardiac Rhythm: Normal sinus rhythm (11/19 0749) Resp:  [15-23] 17 (11/19 0749) BP: (119-142)/(61-94) 121/67 (11/19 0749) SpO2:  [96 %-100 %] 97 % (11/19 0300) Weight:  [169 lb 5 oz (76.8 kg)] 169 lb 5 oz (76.8 kg) (11/19 0300)  Intake/Output from previous day: 11/18 0701 - 11/19 0700 In: 240 [P.O.:240] Out: 790 [Urine:700; Chest Tube:90]  General appearance: alert, cooperative and no distress Heart: regular rate and rhythm Lungs: clear to auscultation bilaterally Abdomen: soft, non-tender; bowel sounds normal; no masses,  no organomegaly Extremities: extremities normal, atraumatic, no cyanosis or edema Wound: clean and dry  Lab Results: Recent Labs    04/12/17 0402  WBC 6.5  HGB 12.1*  HCT 36.1*  PLT 224   BMET:  Recent Labs    04/12/17 0402  NA 136  K 3.4*  CL 102  CO2 25  GLUCOSE 88  BUN 13  CREATININE 0.70  CALCIUM 8.7*    PT/INR: No results for input(s): LABPROT, INR in the last 72 hours. ABG No results found for: PHART, HCO3, TCO2, ACIDBASEDEF, O2SAT CBG (last 3)  No results for input(s): GLUCAP in the last 72 hours.  Assessment/Plan: S/P Procedure(s) (LRB): CHEST TUBE INSERTION (Left)  1. Pulm- chest tube removed yesterday, follow up CXR free from pneumothorax 2. Dispo- patient stable, will d/c home today   LOS: 3 days    Ellwood Handler 04/14/2017

## 2017-04-15 ENCOUNTER — Ambulatory Visit: Payer: Medicare Other | Admitting: Cardiology

## 2017-04-21 ENCOUNTER — Other Ambulatory Visit: Payer: Self-pay | Admitting: Internal Medicine

## 2017-04-23 DIAGNOSIS — M4722 Other spondylosis with radiculopathy, cervical region: Secondary | ICD-10-CM | POA: Diagnosis not present

## 2017-04-23 DIAGNOSIS — M4804 Spinal stenosis, thoracic region: Secondary | ICD-10-CM | POA: Diagnosis not present

## 2017-04-23 DIAGNOSIS — M47814 Spondylosis without myelopathy or radiculopathy, thoracic region: Secondary | ICD-10-CM | POA: Diagnosis not present

## 2017-04-28 NOTE — Progress Notes (Signed)
CARDIOLOGY OFFICE NOTE  Date:  04/29/2017    [name] Date of Birth: [birth year] Medical Record #[id number]  PCP:  Orpah Melter, MD  Cardiologist:  [facility]    Chief Complaint  Patient presents with  . Atrial Fibrillation    Follow up visit -  seen for [name]    History of Present Illness: MARKEIS ALLMAN is a 71 y.o. male who presents today for a 7 month check. Seen for [name].   He has a history of PAF first noted in December of 2013 then again in 2016. He is on low dose CCB therapy and low dose aspirin. Echocardiogram demonstrated EF in the 50% range. Prior catheterization in 2010 showed no flow-limiting CAD. Other issues include COPD, spine surgery, occasional palpitation. He has had agent orange exposure.   Last seen in May and felt to be doing ok.   Admitted last month - had lumbar fusion - complicated by pneumothorax - required chest tube placement by Dr. Prescott Gum.    Comes in today. Here alone. Seeing Dr. Prescott Gum next week - with CXR. Doing ok. Rare palpitation. No chest pain. Breathing is stable. He is already back in the gym. Wearing his brace. He feels like he is doing well. Does not use his short acting CCB - but it is very much expired. He has no real concerns today and feels like he is doing well.   Past Medical History:  Diagnosis Date  . Agent orange exposure 1970's   takes Imdur,Diltiazem, and Atacand daily  . ALLERGIC RHINITIS   . Asthma   . Bruises easily    d/t prednisone daily  . Chronic bronchitis ([place])    "used to get it q yr; last time ?2008" (04/28/2012)  . COPD (chronic obstructive pulmonary disease) (HCC)    agent orange exposure  . Degenerative disk disease    "qwhere" (04/28/2012)  . Diverticulitis   . Dysrhythmia    afib  . Elevated uric acid in blood    takes Allopurinol daily  . Enlarged prostate    but not on any meds  . GERD (gastroesophageal reflux disease)    takes Protonix daily  . H/O hiatal hernia   .  Headache   . History of pneumonia    a. 2010  . Hyperlipidemia    takes Lipitor daily; pt. states he takes a preventive  . Joint pain   . Joint swelling   . PAF (paroxysmal atrial fibrillation) ([place])    a. Dx 04/2012, CHA2DS2VASc = 1 (age);  b. 04/2012 Echo: EF 40-50%, mild MR.  . Personal history of colonic adenomas 02/09/2013  . Shortness of breath dyspnea     Past Surgical History:  Procedure Laterality Date  . ANTERIOR CERVICAL DECOMP/DISCECTOMY FUSION N/A 07/21/2012   Procedure: ANTERIOR CERVICAL DECOMPRESSION/DISCECTOMY FUSION 2 LEVELS;  Surgeon: [name], MD;  Location: [place] [facility];  Service: Neurosurgery;  Laterality: N/A;  C4-5 C5-6 Anterior cervical decompression/diskectomy/fusion  . ANTERIOR LAT LUMBAR FUSION N/A 04/07/2017   Procedure: Thoracic twelve-Lumbar one Anterolateral decompression/fusion;  Surgeon: [name], MD;  Location: [place];  Service: Neurosurgery;  Laterality: N/A;  . BACK SURGERY     x 3  . CARDIAC CATHETERIZATION  11/2008   [name] - 20% calcified non flow limiting left main, 50% EF apical hypokinesis  . CERVICAL DISC ARTHROPLASTY N/A 04/07/2017   Procedure: Cervical six-seven Disc arthroplasty;  Surgeon: [name], MD;  Location: [place];  Service: Neurosurgery;  Laterality: N/A;  . CHEST TUBE INSERTION Left 04/11/2017   Procedure: CHEST TUBE INSERTION;  Surgeon: [name], MD;  Location: [place];  Service: Thoracic;  Laterality: Left;  . CHOLECYSTECTOMY    . COLONOSCOPY    . ESOPHAGOGASTRODUODENOSCOPY    . EYE SURGERY Bilateral 2011   steroidal encapsulation" (04/28/2012)  . FOOT SURGERY Right    x 2  . KNEE ARTHROSCOPY Bilateral   . LATERAL / POSTERIOR COMBINED FUSION LUMBAR SPINE  2012  . REVERSE SHOULDER ARTHROPLASTY  04/28/2012   Procedure: REVERSE SHOULDER ARTHROPLASTY;  Surgeon: [name], MD;  Location: [place];  Service: Orthopedics;  Laterality: Left;  Left shouder reverse total shoulder arthroplasty  . SHOULDER  SURGERY    . [place]  . TOTAL HIP ARTHROPLASTY Left 2011   "left" (04/28/2012)  . TOTAL HIP ARTHROPLASTY Right 07/31/2015   Procedure: TOTAL HIP ARTHROPLASTY ANTERIOR APPROACH;  Surgeon: [name], MD;  Location: [place];  Service: Orthopedics;  Laterality: Right;  . TOTAL KNEE ARTHROPLASTY  01/10/2012   Procedure: TOTAL KNEE ARTHROPLASTY;  Surgeon: [name], MD;  Location: [place];  Service: Orthopedics;;  left total knee arthroplasty  . TOTAL KNEE ARTHROPLASTY Right 06/17/2014   Procedure: TOTAL KNEE ARTHROPLASTY;  Surgeon: [name], MD;  Location: [place];  Service: Orthopedics;  Laterality: Right;    Medications: Current Meds  Medication Sig  . allopurinol (ZYLOPRIM) 100 MG tablet Take 100 mg by mouth daily.    [name] aspirin EC 81 MG tablet Take 1 tablet (81 mg total) by mouth daily.  [name] atorvastatin (LIPITOR) 10 MG tablet TAKE 1 TABLET (10 MG TOTAL) BY MOUTH DAILY.  . beclomethasone (QVAR) 80 MCG/ACT inhaler Inhale 2 puffs then rinse mouth, twice daily (Patient taking differently: Inhale 2 puffs into the lungs 2 (two) times daily. )  . candesartan (ATACAND) 4 MG tablet TAKE 1/2 TABLETS (2 MG TOTAL) BY MOUTH DAILY. (Patient taking differently: TAKE 1/2 TABLETS (2 MG TOTAL) BY MOUTH DAILY WITH SUPPER)  . cyclobenzaprine (FLEXERIL) 10 MG tablet Take 1 tablet (10 mg total) 3 (three) times daily as needed by mouth for muscle spasms.  . diclofenac sodium (VOLTAREN) 1 % GEL Apply 2 g daily as needed topically (pain).   [name] diltiazem (CARDIZEM CD) 180 MG 24 hr capsule TAKE 1 CAPSULE (180 MG TOTAL) BY MOUTH DAILY.  [name] diltiazem (CARDIZEM) 30 MG tablet Take 1 tablet every 4 hours AS NEEDED for afib HR >100 and BP >100  . isosorbide mononitrate (IMDUR) 30 MG 24 hr tablet TAKE 1 TABLET (30 MG TOTAL) BY MOUTH DAILY.  . pantoprazole (PROTONIX) 40 MG tablet TAKE 1 TABLET BY MOUTH TWICE A DAY  . predniSONE (DELTASONE) 10 MG tablet TAKE 1 TABLET (10 MG TOTAL) BY MOUTH DAILY.  CONTINUOUS  . STRIVERDI RESPIMAT 2.5 MCG/ACT AERS INHALE 2 PUFFS INTO THE LUNGS DAILY.  . tamsulosin (FLOMAX) 0.4 MG CAPS capsule Take 0.4 mg daily with supper by mouth.   . [DISCONTINUED] diltiazem (CARDIZEM) 30 MG tablet Take 1 tablet every 4 hours AS NEEDED for afib HR >100 and BP >100 (Patient taking differently: Take 30 mg by mouth every 4 (four) hours as needed (for AFIB HR > 100 and BP > 100). )     Allergies: Allergies  Allergen Reactions  . Morphine And Related Palpitations and Other (See Comments)    Light headed and sweaty; "my heart races" (04/28/2012), pt states too much morphine  gave him this reaction  . Levaquin [Levofloxacin] Itching    Social History: The patient  reports that  has never smoked. he has never used smokeless tobacco. He reports that he does not drink alcohol or use drugs.   Family History: The patient's family history includes Diabetes in his mother; Heart disease in his father; Prostate cancer in his brother.   Review of Systems: Please see the history of present illness.   Otherwise, the review of systems is positive for none.   All other systems are reviewed and negative.   Physical Exam: VS:  BP 128/70   Pulse 64   Ht 6\' 2"  (1.88 m)   Wt 165 lb (74.8 kg)   BMI 21.18 kg/m  .  BMI Body mass index is 21.18 kg/m.  Wt Readings from Last 3 Encounters:  04/29/17 165 lb (74.8 kg)  04/14/17 169 lb 5 oz (76.8 kg)  04/11/17 160 lb (72.6 kg)    General: Pleasant. He is alert and in no acute distress.   HEENT: Normal.  Neck: Supple, no JVD, carotid bruits, or masses noted.  Cardiac: Regular rate and rhythm. No murmurs, rubs, or gallops. No edema.  Respiratory:  Lungs are clear to auscultation bilaterally with normal work of breathing. He has good air exchange bilaterally.  GI: Soft and nontender.  MS: No deformity or atrophy. Gait and ROM intact. Has a back brace in place.  Skin: Warm and dry. Color is normal.  Neuro:  Strength and sensation are  intact and no gross focal deficits noted.  Psych: Alert, appropriate and with normal affect.   LABORATORY DATA:  EKG:  EKG is not ordered today.   Lab Results  Component Value Date   WBC 6.5 04/12/2017   HGB 12.1 (L) 04/12/2017   HCT 36.1 (L) 04/12/2017   PLT 224 04/12/2017   GLUCOSE 88 04/12/2017   CHOL 168 10/20/2014   TRIG 60.0 10/20/2014   HDL 92.50 10/20/2014   LDLCALC 64 10/20/2014   ALT 20 07/25/2015   AST 27 07/25/2015   NA 136 04/12/2017   K 3.4 (L) 04/12/2017   CL 102 04/12/2017   CREATININE 0.70 04/12/2017   BUN 13 04/12/2017   CO2 25 04/12/2017   TSH 1.979 05/10/2012   INR 0.98 07/25/2015       BNP (last 3 results) No results for input(s): BNP in the last 8760 hours.  ProBNP (last 3 results) No results for input(s): PROBNP in the last 8760 hours.   Other Studies Reviewed Today:   Assessment/Plan:  1. PAF - has opted for aspirin until he turns 76. CHADSVASC is only 1. Then would consider anticoagulation. He has rare palpitations. Needs his short acting CCB refilled today. Overall, I think he is doing well.  2. Recent back surgery - complicated by pneumothorax - doing well. CXR next week.  3. COPD  4. HLD - on statin - labs from March noted.   Current medicines are reviewed with the patient today.  The patient does not have concerns regarding medicines other than what has been noted above.  The following changes have been made:  See above.  Labs/ tests ordered today include:   No orders of the defined types were placed in this encounter.    Disposition:   FU with [name] in 6 months.   Patient is agreeable to this plan and will call if any problems develop in the interim.   SignedTruitt Merle, NP  04/29/2017 3:59 PM  [facility] [address] [place], [place]  [address] Phone: [phone] Fax: [phone]

## 2017-04-29 ENCOUNTER — Encounter: Payer: Self-pay | Admitting: Nurse Practitioner

## 2017-04-29 ENCOUNTER — Ambulatory Visit (INDEPENDENT_AMBULATORY_CARE_PROVIDER_SITE_OTHER): Payer: Medicare Other | Admitting: Nurse Practitioner

## 2017-04-29 VITALS — BP 128/70 | HR 64 | Ht 74.0 in | Wt 165.0 lb

## 2017-04-29 DIAGNOSIS — E7800 Pure hypercholesterolemia, unspecified: Secondary | ICD-10-CM | POA: Diagnosis not present

## 2017-04-29 DIAGNOSIS — I480 Paroxysmal atrial fibrillation: Secondary | ICD-10-CM

## 2017-04-29 MED ORDER — DILTIAZEM HCL 30 MG PO TABS: ORAL_TABLET | ORAL | 6 refills | Status: DC

## 2017-04-29 NOTE — Patient Instructions (Addendum)
We will be checking the following labs today - NONE   Medication Instructions:    Continue with your current medicines.   I did refill the Diltiazem 30 mg to use for elevated HR    Testing/Procedures To Be Arranged:  N/A  Follow-Up:   See [name] in 6 months    Other Special Instructions:   N/A    If you need a refill on your cardiac medications before your next appointment, please call your pharmacy.   Call the [place] office at [phone] if you have any questions, problems or concerns.

## 2017-05-02 ENCOUNTER — Other Ambulatory Visit: Payer: Self-pay | Admitting: Cardiothoracic Surgery

## 2017-05-02 ENCOUNTER — Telehealth: Payer: Self-pay | Admitting: *Deleted

## 2017-05-02 DIAGNOSIS — J939 Pneumothorax, unspecified: Secondary | ICD-10-CM

## 2017-05-02 NOTE — Telephone Encounter (Signed)
Received prior authorizaion from  [facility] for DILTIAZEM HCL TABS 30 prn, completed and faxed back to [facility].

## 2017-05-05 ENCOUNTER — Ambulatory Visit: Payer: Medicare Other

## 2017-05-13 ENCOUNTER — Ambulatory Visit (INDEPENDENT_AMBULATORY_CARE_PROVIDER_SITE_OTHER): Payer: Medicare Other | Admitting: Physician Assistant

## 2017-05-13 ENCOUNTER — Ambulatory Visit
Admission: RE | Admit: 2017-05-13 | Discharge: 2017-05-13 | Disposition: A | Discharge status: Home / Self Care | Payer: Medicare Other | Source: Ambulatory Visit | Attending: Cardiothoracic Surgery | Admitting: Cardiothoracic Surgery

## 2017-05-13 ENCOUNTER — Other Ambulatory Visit: Payer: Self-pay

## 2017-05-13 ENCOUNTER — Telehealth: Payer: Self-pay | Admitting: Internal Medicine

## 2017-05-13 VITALS — BP 119/70 | HR 63 | Ht 74.0 in | Wt 159.0 lb

## 2017-05-13 DIAGNOSIS — J939 Pneumothorax, unspecified: Secondary | ICD-10-CM | POA: Diagnosis not present

## 2017-05-13 DIAGNOSIS — J9312 Secondary spontaneous pneumothorax: Secondary | ICD-10-CM

## 2017-05-13 MED ORDER — DOXYCYCLINE HYCLATE 100 MG PO TABS: 100.0000 mg | ORAL_TABLET | Freq: Two times a day (BID) | ORAL | 0 refills | Status: DC

## 2017-05-13 NOTE — Progress Notes (Signed)
Levi [name] is a 71 y.o. male patient s/p chest tube placement on 11/16 for a pneumothorax who presents today for his routine 4-week follow-up visit.    No diagnosis found. Past Medical History:  Diagnosis Date  . Agent orange exposure 1970's   takes Imdur,Diltiazem, and Atacand daily  . ALLERGIC RHINITIS   . Asthma   . Bruises easily    d/t prednisone daily  . Chronic bronchitis ([place])    "used to get it q yr; last time ?2008" (04/28/2012)  . COPD (chronic obstructive pulmonary disease) (HCC)    agent orange exposure  . Degenerative disk disease    "qwhere" (04/28/2012)  . Diverticulitis   . Dysrhythmia    afib  . Elevated uric acid in blood    takes Allopurinol daily  . Enlarged prostate    but not on any meds  . GERD (gastroesophageal reflux disease)    takes Protonix daily  . H/O hiatal hernia   . Headache   . History of pneumonia    a. 2010  . Hyperlipidemia    takes Lipitor daily; pt. states he takes a preventive  . Joint pain   . Joint swelling   . PAF (paroxysmal atrial fibrillation) ([place])    a. Dx 04/2012, CHA2DS2VASc = 1 (age);  b. 04/2012 Echo: EF 40-50%, mild MR.  . Personal history of colonic adenomas 02/09/2013  . Shortness of breath dyspnea    No past surgical history pertinent negatives on file.   Scheduled Meds: Current Outpatient Medications on File Prior to Visit  Medication Sig Dispense Refill  . albuterol (PROVENTIL HFA) 108 (90 Base) MCG/ACT inhaler INHALE 2 PUFFS INTO THE LUNGS EVERY 4 (FOUR) HOURS AS NEEDED FOR WHEEZING. (Patient taking differently: Inhale 2 puffs every 4 (four) hours as needed into the lungs for wheezing. ) 6.7 each 12  . allopurinol (ZYLOPRIM) 100 MG tablet Take 100 mg by mouth daily.      [name] aspirin EC 81 MG tablet Take 1 tablet (81 mg total) by mouth daily. 90 tablet 3  . atorvastatin (LIPITOR) 10 MG tablet TAKE 1 TABLET (10 MG TOTAL) BY MOUTH DAILY. 90 tablet 1  . beclomethasone (QVAR) 80 MCG/ACT inhaler Inhale 2 puffs then  rinse mouth, twice daily (Patient taking differently: Inhale 2 puffs into the lungs 2 (two) times daily. ) 1 Inhaler 12  . candesartan (ATACAND) 4 MG tablet TAKE 1/2 TABLETS (2 MG TOTAL) BY MOUTH DAILY. (Patient taking differently: TAKE 1/2 TABLETS (2 MG TOTAL) BY MOUTH DAILY WITH SUPPER) 15 tablet 5  . cyclobenzaprine (FLEXERIL) 10 MG tablet Take 1 tablet (10 mg total) 3 (three) times daily as needed by mouth for muscle spasms. 30 tablet 5  . diclofenac sodium (VOLTAREN) 1 % GEL Apply 2 g daily as needed topically (pain).     [name] diltiazem (CARDIZEM CD) 180 MG 24 hr capsule TAKE 1 CAPSULE (180 MG TOTAL) BY MOUTH DAILY. 30 capsule 6  . diltiazem (CARDIZEM) 30 MG tablet Take 1 tablet every 4 hours AS NEEDED for afib HR >100 and BP >100 15 tablet 6  . isosorbide mononitrate (IMDUR) 30 MG 24 hr tablet TAKE 1 TABLET (30 MG TOTAL) BY MOUTH DAILY. 30 tablet 5  . pantoprazole (PROTONIX) 40 MG tablet TAKE 1 TABLET BY MOUTH TWICE A DAY 60 tablet 5  . predniSONE (DELTASONE) 10 MG tablet TAKE 1 TABLET (10 MG TOTAL) BY MOUTH DAILY. CONTINUOUS 30 tablet 11  . STRIVERDI RESPIMAT 2.5 MCG/ACT AERS INHALE  2 PUFFS INTO THE LUNGS DAILY. 1 Inhaler 6  . tamsulosin (FLOMAX) 0.4 MG CAPS capsule Take 0.4 mg daily with supper by mouth.   11   No current facility-administered medications on file prior to visit.     Allergies  Allergen Reactions  . Morphine And Related Palpitations and Other (See Comments)    Light headed and sweaty; "my heart races" (04/28/2012), pt states too much morphine gave him this reaction  . Levaquin [Levofloxacin] Itching    Blood pressure 119/70, pulse 63, height 6\' 2"  (1.88 m), weight 159 lb (72.1 kg), SpO2 99 %.  Subjective: Mr. Levi [name] presents today for his 4-week follow-up visit status post chest tube insertion for pneumothorax.  He has had some occasional shortness of breath which is due to a pulmonary infection treated by his pulmonologist.  Otherwise he has been doing quite well.  He  recently had a cardiology visit for his atrial fibrillation.  He is in normal sinus rhythm on today's visit.  Objective: Cor: RRR, no murmur Pulm: CTA bilaterally in all fields Abd: no tenderness Wound: chest tube site is clean and dry, well-healed.   CLINICAL DATA:  Left pneumothorax  EXAM: CHEST  2 VIEW  COMPARISON:  04/14/2017  FINDINGS: Heart and mediastinal contours are within normal limits. No focal opacities or effusions. No acute bony abnormality. No residual pneumothorax. Postoperative changes in the visualized upper lumbar spine and left shoulder.  IMPRESSION: No active cardiopulmonary disease.  No pneumothorax.   Electronically Signed   By: Levi [name] M.D.   On: 05/13/2017 14:21  Assessment & Plan: Levi [name] presents today for his 4-week follow-up visit status post chest tube insertion for pneumothorax.  He has recently been started on antibiotics for a pulmonary infection.  He is followed by a pulmonologist.  He also recently had an appointment with Levi Merle, NP for his atrial fibrillation.  He has been asymptomatic and is in normal sinus rhythm during today's visit.  I reviewed his chest x-ray today which showed new pneumothorax, no focal opacities or effusions, and no acute bony abnormalities.  He is advised to go to the emergency department if he has sudden shortness of breath and pain.  He is to follow-up with our office on an as-needed basis.  He will follow-up with pulmonology if his symptoms from his infection do not improve.  He will follow-up with cardiology in 6 months.   Levi [name] 05/13/2017

## 2017-05-13 NOTE — Patient Instructions (Signed)
You may return to driving an automobile as long as you are no longer requiring oral narcotic pain relievers during the daytime.  It would be wise to start driving only short distances during the daylight and gradually increase from there as you feel comfortable.  Make every effort to stay physically active, get some type of exercise on a regular basis, and stick to a "heart healthy diet".  The long term benefits for regular exercise and a healthy diet are critically important to your overall health and wellbeing.   

## 2017-05-13 NOTE — Telephone Encounter (Signed)
Spoke with pt, he states he may have an infection, he is coughing up brown mucus and experiencing SOB. He had an operation a month ago where his lung was punctured and had to have a chest tube inserted . Denies fever and chest pain and just wanted CY to advise if he needed an Rx for his symptoms.   [facility]  Current Outpatient Medications on File Prior to Visit  Medication Sig Dispense Refill  . albuterol (PROVENTIL HFA) 108 (90 Base) MCG/ACT inhaler INHALE 2 PUFFS INTO THE LUNGS EVERY 4 (FOUR) HOURS AS NEEDED FOR WHEEZING. (Patient taking differently: Inhale 2 puffs every 4 (four) hours as needed into the lungs for wheezing. ) 6.7 each 12  . allopurinol (ZYLOPRIM) 100 MG tablet Take 100 mg by mouth daily.      [name] aspirin EC 81 MG tablet Take 1 tablet (81 mg total) by mouth daily. 90 tablet 3  . atorvastatin (LIPITOR) 10 MG tablet TAKE 1 TABLET (10 MG TOTAL) BY MOUTH DAILY. 90 tablet 1  . beclomethasone (QVAR) 80 MCG/ACT inhaler Inhale 2 puffs then rinse mouth, twice daily (Patient taking differently: Inhale 2 puffs into the lungs 2 (two) times daily. ) 1 Inhaler 12  . candesartan (ATACAND) 4 MG tablet TAKE 1/2 TABLETS (2 MG TOTAL) BY MOUTH DAILY. (Patient taking differently: TAKE 1/2 TABLETS (2 MG TOTAL) BY MOUTH DAILY WITH SUPPER) 15 tablet 5  . cyclobenzaprine (FLEXERIL) 10 MG tablet Take 1 tablet (10 mg total) 3 (three) times daily as needed by mouth for muscle spasms. 30 tablet 5  . diclofenac sodium (VOLTAREN) 1 % GEL Apply 2 g daily as needed topically (pain).     [name] diltiazem (CARDIZEM CD) 180 MG 24 hr capsule TAKE 1 CAPSULE (180 MG TOTAL) BY MOUTH DAILY. 30 capsule 6  . diltiazem (CARDIZEM) 30 MG tablet Take 1 tablet every 4 hours AS NEEDED for afib HR >100 and BP >100 15 tablet 6  . isosorbide mononitrate (IMDUR) 30 MG 24 hr tablet TAKE 1 TABLET (30 MG TOTAL) BY MOUTH DAILY. 30 tablet 5  . pantoprazole (PROTONIX) 40 MG tablet TAKE 1 TABLET BY MOUTH TWICE A DAY 60 tablet 5  . predniSONE  (DELTASONE) 10 MG tablet TAKE 1 TABLET (10 MG TOTAL) BY MOUTH DAILY. CONTINUOUS 30 tablet 11  . STRIVERDI RESPIMAT 2.5 MCG/ACT AERS INHALE 2 PUFFS INTO THE LUNGS DAILY. 1 Inhaler 6  . tamsulosin (FLOMAX) 0.4 MG CAPS capsule Take 0.4 mg daily with supper by mouth.   11   No current facility-administered medications on file prior to visit.    Allergies  Allergen Reactions  . Morphine And Related Palpitations and Other (See Comments)    Light headed and sweaty; "my heart races" (04/28/2012), pt states too much morphine gave him this reaction  . Levaquin [Levofloxacin] Itching

## 2017-05-13 NOTE — Telephone Encounter (Signed)
Called and spoke to pt. Informed him of the recs per CY. Rx sent to preferred pharmacy. Pt verbalized understanding and denied any further questions or concerns at this time.   

## 2017-05-13 NOTE — Telephone Encounter (Signed)
Doxycycline 100 mg, # 14, 1 twice daily 

## 2017-06-04 DIAGNOSIS — M47814 Spondylosis without myelopathy or radiculopathy, thoracic region: Secondary | ICD-10-CM | POA: Diagnosis not present

## 2017-06-04 DIAGNOSIS — M4804 Spinal stenosis, thoracic region: Secondary | ICD-10-CM | POA: Diagnosis not present

## 2017-07-15 ENCOUNTER — Other Ambulatory Visit: Payer: Self-pay | Admitting: Cardiology

## 2017-07-17 ENCOUNTER — Other Ambulatory Visit: Payer: Self-pay | Admitting: Cardiology

## 2017-07-31 ENCOUNTER — Ambulatory Visit (INDEPENDENT_AMBULATORY_CARE_PROVIDER_SITE_OTHER): Payer: Medicare Other | Admitting: Internal Medicine

## 2017-07-31 ENCOUNTER — Encounter: Payer: Self-pay | Admitting: Internal Medicine

## 2017-07-31 VITALS — BP 118/76 | HR 61 | Ht 74.0 in | Wt 168.0 lb

## 2017-07-31 DIAGNOSIS — J939 Pneumothorax, unspecified: Secondary | ICD-10-CM

## 2017-07-31 DIAGNOSIS — J4530 Mild persistent asthma, uncomplicated: Secondary | ICD-10-CM

## 2017-07-31 MED ORDER — ALBUTEROL SULFATE HFA 108 (90 BASE) MCG/ACT IN AERS: INHALATION_SPRAY | RESPIRATORY_TRACT | 12 refills | Status: DC

## 2017-07-31 NOTE — Patient Instructions (Signed)
We want to find the lowest prednisone dose needed to keep you comfortable. Suggest you try alternating 5 mg with 10 mg every other day.  Script sent refilling Proventil.  Please call if we can help

## 2017-07-31 NOTE — Assessment & Plan Note (Signed)
We discussed prednisone therapy again noting that insurance was not covering Xolair in the past.  We will try for lowest effective dose. Plan-try reducing prednisone to alternating 10/5 every other day.  Refill Proventil

## 2017-07-31 NOTE — Progress Notes (Signed)
Subjective:    Patient ID: Levi [name], male    DOB: December 19, 1945, 72 y.o.   MRN: [id number] HPI  Male never smoker followed for allergy/asthma, rhinitis, history  ABPA, complicated by PAfib PFT- 01/04/2015-minimal restriction, minimal diffusion defect, insignificant response to bronchodilator. FVC 4.55/86%, FEV1 3.28/83%, FEV1/FVC 0.72, TLC 75%, DLCO 77 Office Spirometry 10/25/16-mild restriction of exhaled volume, minimal obstruction. FVC 4.04/78%, FEV1 2.71/71%, ratio 0.67, FEF 25-75% 1.68/58% Respiratory allergy profile 10/26/15- total IgE 60, minor elevations for dust mites and Aspergillus fumigatus ---------------------------------------------------------------------------------------------------.  01/30/17- 72 year old male never smoker followed for allergy/asthma, rhinitis, history ABPA, complicated by PAfib FOLLOWS FOR:Pt states he is havig a harder time breathing and using Rescue inhaler more than usual. Pt is concerned that his Qvar inhaler was changed to 26mcg instead of the usual 73mcg-questions why.  Occasional chest tightness and wheeze with more need for rescue inhaler this summer-2 or 3 times daily. He remains on maintenance prednisone 10 mg daily-chronic. Continues Striverdi Respimat 2 puffs  daily maintenance. He hopes it going back to Qvar 80 would be enough to keep him stable. No acute events. Pending another thoracic laminectomy. CBC with differential 10/25/16- EOS WNL  CXR 10/25/16 IMPRESSION: Chronic pulmonary hyperinflation. No acute cardiopulmonary Abnormality.  07/31/17- 72 year old male never smoker followed for allergy/asthma, rhinitis, history ABPA, complicated by PAfib ----Pt reports asthma is controlled, but legs and hands swelling in last 2 months. Stiverdi Respimat, prednisone 10  Mg daily, Proventil hfa, Qvar 80,  Spinal surgery in November complicated by left pneumothorax/chest tube, now resolved. In the past, his asthma control has been dependent on maintenance  prednisone-Dr. Gwenette Greet told him to stay on it.  He also has felt he needed Qvar 80 rather than Qvar 40. He has had some occasional edema in his ankles, possibly related to his cardiac status and medication. CXR 05/13/17- IMPRESSION: No active cardiopulmonary disease.  No pneumothorax.  ROS-see HPI   + = positive Constitutional:    weight loss, night sweats, fevers, chills, fatigue, lassitude. HEENT:    headaches, difficulty swallowing, tooth/dental problems, sore throat,       sneezing, itching, ear ache, nasal congestion, post nasal drip, snoring CV:    chest pain, orthopnea, PND, +swelling in lower extremities, anasarca,                                                    dizziness, palpitations Resp:   + shortness of breath with exertion or at rest.                productive cough,   non-productive cough, coughing up of blood.              change in color of mucus.  + wheezing.   Skin:    rash or lesions. GI:  No-   heartburn, indigestion, abdominal pain, nausea, vomiting, diarrhea,                 change in bowel habits, loss of appetite GU: dysuria, change in color of urine, no urgency or frequency.   flank pain. MS:   joint pain, stiffness, decreased range of motion, back pain. Neuro-     nothing unusual Psych:  change in mood or affect.  depression or anxiety.   memory loss.    Objective:  OBJ- Physical Exam General- Alert, Oriented,  Affect-appropriate, Distress- none acute, tall/thin Skin- rash-none, lesions- none, excoriation- none Lymphadenopathy- none Head- atraumatic            Eyes- Gross vision intact, PERRLA, conjunctivae and secretions clear            Ears- Hearing, canals-normal            Nose- Clear, no-Septal dev, mucus, polyps, erosion, perforation             Throat- Mallampati II , mucosa clear , drainage- none, tonsils- atrophic Neck- flexible , trachea midline, no stridor , thyroid nl, carotid no bruit Chest - symmetrical excursion , unlabored            Heart/CV- RRR , no murmur , no gallop  , no rub, nl s1 s2                           - JVD- none , edema- none, stasis changes- none, varices+ Left calf           Lung- clear to P&A, wheeze- none, cough- none , dullness-none, rub- none           Chest wall-  Abd-  Br/ Gen/ Rectal- Not done, not indicated Extrem- cyanosis- none, clubbing, none, atrophy- none, strength- nl Neuro- grossly intact to observation    Assessment & Plan:

## 2017-08-04 DIAGNOSIS — H33001 Unspecified retinal detachment with retinal break, right eye: Secondary | ICD-10-CM | POA: Diagnosis not present

## 2017-08-04 DIAGNOSIS — H527 Unspecified disorder of refraction: Secondary | ICD-10-CM | POA: Diagnosis not present

## 2017-08-04 DIAGNOSIS — H43813 Vitreous degeneration, bilateral: Secondary | ICD-10-CM | POA: Diagnosis not present

## 2017-08-04 DIAGNOSIS — H35433 Paving stone degeneration of retina, bilateral: Secondary | ICD-10-CM | POA: Diagnosis not present

## 2017-08-04 DIAGNOSIS — Z961 Presence of intraocular lens: Secondary | ICD-10-CM | POA: Diagnosis not present

## 2017-08-04 DIAGNOSIS — H53001 Unspecified amblyopia, right eye: Secondary | ICD-10-CM | POA: Diagnosis not present

## 2017-08-05 DIAGNOSIS — H33011 Retinal detachment with single break, right eye: Secondary | ICD-10-CM | POA: Diagnosis not present

## 2017-08-05 DIAGNOSIS — D3131 Benign neoplasm of right choroid: Secondary | ICD-10-CM | POA: Diagnosis not present

## 2017-08-05 DIAGNOSIS — H35432 Paving stone degeneration of retina, left eye: Secondary | ICD-10-CM | POA: Diagnosis not present

## 2017-08-05 DIAGNOSIS — H35371 Puckering of macula, right eye: Secondary | ICD-10-CM | POA: Diagnosis not present

## 2017-08-07 DIAGNOSIS — H33011 Retinal detachment with single break, right eye: Secondary | ICD-10-CM | POA: Diagnosis not present

## 2017-08-07 DIAGNOSIS — H35371 Puckering of macula, right eye: Secondary | ICD-10-CM | POA: Diagnosis not present

## 2017-08-08 DIAGNOSIS — H33011 Retinal detachment with single break, right eye: Secondary | ICD-10-CM | POA: Diagnosis not present

## 2017-09-03 DIAGNOSIS — M79671 Pain in right foot: Secondary | ICD-10-CM | POA: Diagnosis not present

## 2017-09-03 DIAGNOSIS — J0140 Acute pansinusitis, unspecified: Secondary | ICD-10-CM | POA: Diagnosis not present

## 2017-09-04 DIAGNOSIS — M25571 Pain in right ankle and joints of right foot: Secondary | ICD-10-CM | POA: Diagnosis not present

## 2017-09-04 DIAGNOSIS — M25572 Pain in left ankle and joints of left foot: Secondary | ICD-10-CM | POA: Diagnosis not present

## 2017-09-04 DIAGNOSIS — M25532 Pain in left wrist: Secondary | ICD-10-CM | POA: Diagnosis not present

## 2017-09-04 DIAGNOSIS — M25531 Pain in right wrist: Secondary | ICD-10-CM | POA: Diagnosis not present

## 2017-09-04 DIAGNOSIS — R2689 Other abnormalities of gait and mobility: Secondary | ICD-10-CM | POA: Diagnosis not present

## 2017-09-09 DIAGNOSIS — H35371 Puckering of macula, right eye: Secondary | ICD-10-CM | POA: Diagnosis not present

## 2017-09-09 DIAGNOSIS — M25572 Pain in left ankle and joints of left foot: Secondary | ICD-10-CM | POA: Diagnosis not present

## 2017-09-09 DIAGNOSIS — H33011 Retinal detachment with single break, right eye: Secondary | ICD-10-CM | POA: Diagnosis not present

## 2017-09-09 DIAGNOSIS — M25532 Pain in left wrist: Secondary | ICD-10-CM | POA: Diagnosis not present

## 2017-09-09 DIAGNOSIS — M25571 Pain in right ankle and joints of right foot: Secondary | ICD-10-CM | POA: Diagnosis not present

## 2017-09-09 DIAGNOSIS — M25531 Pain in right wrist: Secondary | ICD-10-CM | POA: Diagnosis not present

## 2017-09-11 DIAGNOSIS — M25572 Pain in left ankle and joints of left foot: Secondary | ICD-10-CM | POA: Diagnosis not present

## 2017-09-11 DIAGNOSIS — M25571 Pain in right ankle and joints of right foot: Secondary | ICD-10-CM | POA: Diagnosis not present

## 2017-09-11 DIAGNOSIS — M25532 Pain in left wrist: Secondary | ICD-10-CM | POA: Diagnosis not present

## 2017-09-11 DIAGNOSIS — M25531 Pain in right wrist: Secondary | ICD-10-CM | POA: Diagnosis not present

## 2017-09-15 DIAGNOSIS — M6281 Muscle weakness (generalized): Secondary | ICD-10-CM | POA: Diagnosis not present

## 2017-09-15 DIAGNOSIS — M25532 Pain in left wrist: Secondary | ICD-10-CM | POA: Diagnosis not present

## 2017-09-15 DIAGNOSIS — R2689 Other abnormalities of gait and mobility: Secondary | ICD-10-CM | POA: Diagnosis not present

## 2017-09-15 DIAGNOSIS — M25531 Pain in right wrist: Secondary | ICD-10-CM | POA: Diagnosis not present

## 2017-09-15 DIAGNOSIS — M25572 Pain in left ankle and joints of left foot: Secondary | ICD-10-CM | POA: Diagnosis not present

## 2017-09-17 DIAGNOSIS — M25572 Pain in left ankle and joints of left foot: Secondary | ICD-10-CM | POA: Diagnosis not present

## 2017-09-17 DIAGNOSIS — R2689 Other abnormalities of gait and mobility: Secondary | ICD-10-CM | POA: Diagnosis not present

## 2017-09-17 DIAGNOSIS — M6281 Muscle weakness (generalized): Secondary | ICD-10-CM | POA: Diagnosis not present

## 2017-09-17 DIAGNOSIS — M25532 Pain in left wrist: Secondary | ICD-10-CM | POA: Diagnosis not present

## 2017-09-17 DIAGNOSIS — M25531 Pain in right wrist: Secondary | ICD-10-CM | POA: Diagnosis not present

## 2017-09-17 DIAGNOSIS — M25571 Pain in right ankle and joints of right foot: Secondary | ICD-10-CM | POA: Diagnosis not present

## 2017-09-22 DIAGNOSIS — M6281 Muscle weakness (generalized): Secondary | ICD-10-CM | POA: Diagnosis not present

## 2017-09-22 DIAGNOSIS — M25532 Pain in left wrist: Secondary | ICD-10-CM | POA: Diagnosis not present

## 2017-09-22 DIAGNOSIS — M25571 Pain in right ankle and joints of right foot: Secondary | ICD-10-CM | POA: Diagnosis not present

## 2017-09-22 DIAGNOSIS — M25531 Pain in right wrist: Secondary | ICD-10-CM | POA: Diagnosis not present

## 2017-09-25 DIAGNOSIS — M25532 Pain in left wrist: Secondary | ICD-10-CM | POA: Diagnosis not present

## 2017-09-25 DIAGNOSIS — M25531 Pain in right wrist: Secondary | ICD-10-CM | POA: Diagnosis not present

## 2017-09-25 DIAGNOSIS — M25571 Pain in right ankle and joints of right foot: Secondary | ICD-10-CM | POA: Diagnosis not present

## 2017-09-25 DIAGNOSIS — M25572 Pain in left ankle and joints of left foot: Secondary | ICD-10-CM | POA: Diagnosis not present

## 2017-09-26 ENCOUNTER — Other Ambulatory Visit: Payer: Self-pay | Admitting: Cardiology

## 2017-09-29 DIAGNOSIS — M25571 Pain in right ankle and joints of right foot: Secondary | ICD-10-CM | POA: Diagnosis not present

## 2017-09-29 DIAGNOSIS — M25532 Pain in left wrist: Secondary | ICD-10-CM | POA: Diagnosis not present

## 2017-09-29 DIAGNOSIS — M25531 Pain in right wrist: Secondary | ICD-10-CM | POA: Diagnosis not present

## 2017-10-01 DIAGNOSIS — M25531 Pain in right wrist: Secondary | ICD-10-CM | POA: Diagnosis not present

## 2017-10-01 DIAGNOSIS — M25571 Pain in right ankle and joints of right foot: Secondary | ICD-10-CM | POA: Diagnosis not present

## 2017-10-01 DIAGNOSIS — M25532 Pain in left wrist: Secondary | ICD-10-CM | POA: Diagnosis not present

## 2017-10-06 DIAGNOSIS — M25571 Pain in right ankle and joints of right foot: Secondary | ICD-10-CM | POA: Diagnosis not present

## 2017-10-06 DIAGNOSIS — M25572 Pain in left ankle and joints of left foot: Secondary | ICD-10-CM | POA: Diagnosis not present

## 2017-10-06 DIAGNOSIS — M25532 Pain in left wrist: Secondary | ICD-10-CM | POA: Diagnosis not present

## 2017-10-06 DIAGNOSIS — M25531 Pain in right wrist: Secondary | ICD-10-CM | POA: Diagnosis not present

## 2017-10-07 DIAGNOSIS — H35371 Puckering of macula, right eye: Secondary | ICD-10-CM | POA: Diagnosis not present

## 2017-10-08 DIAGNOSIS — J441 Chronic obstructive pulmonary disease with (acute) exacerbation: Secondary | ICD-10-CM | POA: Diagnosis not present

## 2017-10-08 DIAGNOSIS — M25571 Pain in right ankle and joints of right foot: Secondary | ICD-10-CM | POA: Diagnosis not present

## 2017-10-08 DIAGNOSIS — R2689 Other abnormalities of gait and mobility: Secondary | ICD-10-CM | POA: Diagnosis not present

## 2017-10-08 DIAGNOSIS — M25531 Pain in right wrist: Secondary | ICD-10-CM | POA: Diagnosis not present

## 2017-10-08 DIAGNOSIS — M6281 Muscle weakness (generalized): Secondary | ICD-10-CM | POA: Diagnosis not present

## 2017-10-13 ENCOUNTER — Telehealth: Payer: Self-pay | Admitting: Internal Medicine

## 2017-10-13 MED ORDER — AZITHROMYCIN 250 MG PO TABS: ORAL_TABLET | ORAL | 0 refills | Status: DC

## 2017-10-13 MED ORDER — BENZONATATE 200 MG PO CAPS: 200.0000 mg | ORAL_CAPSULE | Freq: Three times a day (TID) | ORAL | 1 refills | Status: DC | PRN

## 2017-10-13 NOTE — Telephone Encounter (Signed)
Offer Zpak 250 mg, # 6, 2 today then one daily           Benzonatate 200 mg, # 30, 1 every 8 hours if needed for cough

## 2017-10-13 NOTE — Telephone Encounter (Signed)
Spoke with pt, he was seen by his PCP for SOB and fever. He is now coughing and wheezing but there not much mucus. He feels like he is unable to take a deep breath. He was given doxycycline and prednisone but still not feeling well and no improvement at all after taking the medications for a week.[name] He would like to see CY for an OV but there are no openings. He request CY send something in if he is unable to be worked in today. CY please advise.   [facility]  Current Outpatient Medications on File Prior to Visit  Medication Sig Dispense Refill  . albuterol (PROVENTIL HFA) 108 (90 Base) MCG/ACT inhaler INHALE 2 PUFFS INTO THE LUNGS EVERY 4 (FOUR) HOURS AS NEEDED FOR WHEEZING. 6.7 each 12  . allopurinol (ZYLOPRIM) 100 MG tablet Take 100 mg by mouth daily.      [name] aspirin EC 81 MG tablet Take 1 tablet (81 mg total) by mouth daily. 90 tablet 3  . atorvastatin (LIPITOR) 10 MG tablet TAKE 1 TABLET (10 MG TOTAL) BY MOUTH DAILY. 90 tablet 3  . beclomethasone (QVAR) 80 MCG/ACT inhaler Inhale 2 puffs then rinse mouth, twice daily (Patient taking differently: Inhale 2 puffs into the lungs 2 (two) times daily. ) 1 Inhaler 12  . candesartan (ATACAND) 4 MG tablet TAKE 1/2 TABLETS (2 MG TOTAL) BY MOUTH DAILY. 15 tablet 9  . diltiazem (CARDIZEM CD) 180 MG 24 hr capsule TAKE 1 CAPSULE (180 MG TOTAL) BY MOUTH DAILY. 30 capsule 6  . diltiazem (CARDIZEM) 30 MG tablet Take 1 tablet every 4 hours AS NEEDED for afib HR >100 and BP >100 15 tablet 6  . isosorbide mononitrate (IMDUR) 30 MG 24 hr tablet TAKE 1 TABLET (30 MG TOTAL) BY MOUTH DAILY. 30 tablet 9  . pantoprazole (PROTONIX) 40 MG tablet TAKE 1 TABLET BY MOUTH TWICE A DAY 60 tablet 5  . predniSONE (DELTASONE) 10 MG tablet TAKE 1 TABLET (10 MG TOTAL) BY MOUTH DAILY. CONTINUOUS 30 tablet 11  . STRIVERDI RESPIMAT 2.5 MCG/ACT AERS INHALE 2 PUFFS INTO THE LUNGS DAILY. 1 Inhaler 6  . tamsulosin (FLOMAX) 0.4 MG CAPS capsule Take 0.4 mg daily with supper by mouth.   11    No current facility-administered medications on file prior to visit.    Allergies  Allergen Reactions  . Morphine And Related Palpitations and Other (See Comments)    Light headed and sweaty; "my heart races" (04/28/2012), pt states too much morphine gave him this reaction  . Levaquin [Levofloxacin] Itching

## 2017-10-13 NOTE — Telephone Encounter (Signed)
Spoke with pt and advised if Dr Janee Morn recommendations.  Rx sent to pharmacy.  Pt verbalized understanding.  Nothing  Further needed.

## 2017-10-15 DIAGNOSIS — M25532 Pain in left wrist: Secondary | ICD-10-CM | POA: Diagnosis not present

## 2017-10-15 DIAGNOSIS — M25571 Pain in right ankle and joints of right foot: Secondary | ICD-10-CM | POA: Diagnosis not present

## 2017-10-15 DIAGNOSIS — M25531 Pain in right wrist: Secondary | ICD-10-CM | POA: Diagnosis not present

## 2017-10-15 DIAGNOSIS — M6281 Muscle weakness (generalized): Secondary | ICD-10-CM | POA: Diagnosis not present

## 2017-10-21 ENCOUNTER — Other Ambulatory Visit: Payer: Self-pay | Admitting: Internal Medicine

## 2017-10-22 DIAGNOSIS — M79671 Pain in right foot: Secondary | ICD-10-CM | POA: Diagnosis not present

## 2017-10-22 DIAGNOSIS — M79642 Pain in left hand: Secondary | ICD-10-CM | POA: Diagnosis not present

## 2017-10-22 DIAGNOSIS — M6281 Muscle weakness (generalized): Secondary | ICD-10-CM | POA: Diagnosis not present

## 2017-10-28 ENCOUNTER — Ambulatory Visit (INDEPENDENT_AMBULATORY_CARE_PROVIDER_SITE_OTHER): Payer: Medicare Other | Admitting: Cardiology

## 2017-10-28 ENCOUNTER — Encounter: Payer: Self-pay | Admitting: Cardiology

## 2017-10-28 VITALS — BP 114/60 | HR 58 | Ht 74.0 in | Wt 169.0 lb

## 2017-10-28 DIAGNOSIS — R0602 Shortness of breath: Secondary | ICD-10-CM

## 2017-10-28 DIAGNOSIS — E7800 Pure hypercholesterolemia, unspecified: Secondary | ICD-10-CM | POA: Diagnosis not present

## 2017-10-28 DIAGNOSIS — I480 Paroxysmal atrial fibrillation: Secondary | ICD-10-CM | POA: Diagnosis not present

## 2017-10-28 DIAGNOSIS — J449 Chronic obstructive pulmonary disease, unspecified: Secondary | ICD-10-CM

## 2017-10-28 MED ORDER — ATORVASTATIN CALCIUM 20 MG PO TABS: 20.0000 mg | ORAL_TABLET | Freq: Every day | ORAL | 3 refills | Status: DC

## 2017-10-28 NOTE — Patient Instructions (Signed)
Medication Instructions:  Please increase Atorvastatin to 20 mg a day.  Continue all other medications as listed.  Testing/Procedures: Your physician has requested that you have an echocardiogram. Echocardiography is a painless test that uses sound waves to create images of your heart. It provides your doctor with information about the size and shape of your heart and how well your heart's chambers and valves are working. This procedure takes approximately one hour. There are no restrictions for this procedure.  Follow-Up: Follow up in 6 months with [name], NP.  You will receive a letter in the mail 2 months before you are due.  Please call us when you receive this letter to schedule your follow up appointment.  Follow up in 1 year with [name].  You will receive a letter in the mail 2 months before you are due.  Please call us when you receive this letter to schedule your follow up appointment.  If you need a refill on your cardiac medications before your next appointment, please call your pharmacy.  Thank you for choosing [facility]!!

## 2017-10-28 NOTE — Progress Notes (Signed)
[facility] [address] [place], [place]  [address] Phone: [phone] Fax:  [phone]  Date:  10/28/2017   ID:  Levi [name], DOB 1945-09-04, MRN [id number]  PCP:  Orpah Melter, MD   History of Present Illness: Levi [name] is a 72 y.o. male paroxysmal atrial fibrillation first noted in December of 2013 then again in 2016 here for followup.  PAF-low-dose Cardizem is being utilized. Echocardiogram demonstrated EF in the 50% range. Prior catheterization in 2010 showed no flow-limiting CAD.  Baseline COPD followed in pulmonary clinic.   CHADSVAS - 1 (age).  Occasional swelling in his ankles, exacerbated by calcium channel blocker. When exercising, he may rarely feel a single pounding heartbeat. No shortness of breath, no syncope. He is had issues with his rods in his back. Long-term prednisone has made his bones frail.  Rare isolated pounding, PVC-like sensation. Occasional twinge in chest. Wife ran [employer].    He was seen in the atrial fibrillation clinic on 12/14/14. In review of their note, he was diagnosed back in December 2013 with low atrial fibrillation burden and on diltiazem 180 a day. He woke up in atrial fibrillation and was found to have a heart rate of 110 and felt dizzy. Went to the emergency room in [place], Ativan was given and when he was exercising he returned to sinus rhythm.his episode lasted for about 10 hours. Cardizem 30 mg was given for breakthrough episodes. He did not want to move onto antiarrhythmic therapy. Continue with low-dose aspirin because he has a score of 1.  On 07/31/15 he underwent right hip arthroplasty. He is working out at [employer]. Occasionally will feel pinpoint discomfort right and left chest wall, along medial segment of bicep.  10/07/16-overall no change in symptoms, no chest pain, no shortness of breath, no syncope. Occasional bruising on arms. Rare palpitation.  10/28/17 - since our last visit had chest tube  for spontaneous L PTX following lumbar fusion, 04/11/17, Dr. Prescott Gum.  Detached retine. Still hard to breath.  Still complains of minor ankle edema at times.  We discussed.  No chest pain.  Wt Readings from Last 3 Encounters:  10/28/17 169 lb (76.7 kg)  07/31/17 168 lb (76.2 kg)  05/13/17 159 lb (72.1 kg)     Past Medical History:  Diagnosis Date  . Agent orange exposure 1970's   takes Imdur,Diltiazem, and Atacand daily  . ALLERGIC RHINITIS   . Asthma   . Bruises easily    d/t prednisone daily  . Chronic bronchitis ([facility])    "used to get it q yr; last time ?2008" (04/28/2012)  . COPD (chronic obstructive pulmonary disease) ([facility])    agent orange exposure  . Degenerative disk disease    "qwhere" (04/28/2012)  . Diverticulitis   . Dysrhythmia    afib  . Elevated uric acid in blood    takes Allopurinol daily  . Enlarged prostate    but not on any meds  . GERD (gastroesophageal reflux disease)    takes Protonix daily  . H/O hiatal hernia   . Headache   . History of pneumonia    a. 2010  . Hyperlipidemia    takes Lipitor daily; pt. states he takes a preventive  . Joint pain   . Joint swelling   . PAF (paroxysmal atrial fibrillation) ([facility])    a. Dx 04/2012, CHA2DS2VASc = 1 (age);  b. 04/2012 Echo: EF 40-50%, mild MR.  [name]  Personal history of colonic adenomas 02/09/2013  . Shortness of breath dyspnea     Past Surgical History:  Procedure Laterality Date  . ANTERIOR CERVICAL DECOMP/DISCECTOMY FUSION N/A 07/21/2012   Procedure: ANTERIOR CERVICAL DECOMPRESSION/DISCECTOMY FUSION 2 LEVELS;  Surgeon: [name], MD;  Location: [facility];  Service: Neurosurgery;  Laterality: N/A;  C4-5 C5-6 Anterior cervical decompression/diskectomy/fusion  . ANTERIOR LAT LUMBAR FUSION N/A 04/07/2017   Procedure: Thoracic twelve-Lumbar one Anterolateral decompression/fusion;  Surgeon: [name], MD;  Location: [facility];  Service: Neurosurgery;  Laterality: N/A;  . BACK SURGERY     x 3  .  CARDIAC CATHETERIZATION  11/2008   [name] - 20% calcified non flow limiting left main, 50% EF apical hypokinesis  . CERVICAL DISC ARTHROPLASTY N/A 04/07/2017   Procedure: Cervical six-seven Disc arthroplasty;  Surgeon: [name], MD;  Location: [facility];  Service: Neurosurgery;  Laterality: N/A;  . CHEST TUBE INSERTION Left 04/11/2017   Procedure: CHEST TUBE INSERTION;  Surgeon: [name], MD;  Location: [facility];  Service: Thoracic;  Laterality: Left;  . CHOLECYSTECTOMY    . COLONOSCOPY    . ESOPHAGOGASTRODUODENOSCOPY    . EYE SURGERY Bilateral 2011   steroidal encapsulation" (04/28/2012)  . FOOT SURGERY Right    x 2  . KNEE ARTHROSCOPY Bilateral   . LATERAL / POSTERIOR COMBINED FUSION LUMBAR SPINE  2012  . REVERSE SHOULDER ARTHROPLASTY  04/28/2012   Procedure: REVERSE SHOULDER ARTHROPLASTY;  Surgeon: [name], MD;  Location: [facility];  Service: Orthopedics;  Laterality: Left;  Left shouder reverse total shoulder arthroplasty  . SHOULDER SURGERY    . [place]  . TOTAL HIP ARTHROPLASTY Left 2011   "left" (04/28/2012)  . TOTAL HIP ARTHROPLASTY Right 07/31/2015   Procedure: TOTAL HIP ARTHROPLASTY ANTERIOR APPROACH;  Surgeon: [name], MD;  Location: [facility];  Service: Orthopedics;  Laterality: Right;  . TOTAL KNEE ARTHROPLASTY  01/10/2012   Procedure: TOTAL KNEE ARTHROPLASTY;  Surgeon: [name], MD;  Location: [facility];  Service: Orthopedics;;  left total knee arthroplasty  . TOTAL KNEE ARTHROPLASTY Right 06/17/2014   Procedure: TOTAL KNEE ARTHROPLASTY;  Surgeon: [name], MD;  Location: [facility];  Service: Orthopedics;  Laterality: Right;    Current Outpatient Medications  Medication Sig Dispense Refill  . albuterol (PROAIR HFA) 108 (90 Base) MCG/ACT inhaler Inhale 1-2 puffs into the lungs every 6 (six) hours as needed for wheezing or shortness of breath.    . allopurinol (ZYLOPRIM) 100 MG tablet Take 100 mg by mouth daily.      [name] aspirin  EC 81 MG tablet Take 1 tablet (81 mg total) by mouth daily. 90 tablet 3  . atorvastatin (LIPITOR) 20 MG tablet Take 1 tablet (20 mg total) by mouth daily. 90 tablet 3  . candesartan (ATACAND) 4 MG tablet TAKE 1/2 TABLETS (2 MG TOTAL) BY MOUTH DAILY. 15 tablet 9  . diltiazem (CARDIZEM CD) 180 MG 24 hr capsule TAKE 1 CAPSULE (180 MG TOTAL) BY MOUTH DAILY. 30 capsule 6  . diltiazem (CARDIZEM) 30 MG tablet Take 1 tablet every 4 hours AS NEEDED for afib HR >100 and BP >100 15 tablet 6  . isosorbide mononitrate (IMDUR) 30 MG 24 hr tablet TAKE 1 TABLET (30 MG TOTAL) BY MOUTH DAILY. 30 tablet 9  . pantoprazole (PROTONIX) 40 MG tablet TAKE 1 TABLET BY MOUTH TWICE A DAY 60 tablet 5  . predniSONE (DELTASONE) 10 MG tablet TAKE 1 TABLET (  10 MG TOTAL) BY MOUTH DAILY. CONTINUOUS 30 tablet 11  . QVAR REDIHALER 80 MCG/ACT inhaler Take 2 puffs by mouth 2 (two) times daily.  12  . STRIVERDI RESPIMAT 2.5 MCG/ACT AERS INHALE 2 PUFFS INTO THE LUNGS DAILY. 1 Inhaler 6  . tamsulosin (FLOMAX) 0.4 MG CAPS capsule Take 0.4 mg daily with supper by mouth.   11   No current facility-administered medications for this visit.     Allergies:    Allergies  Allergen Reactions  . Morphine And Related Palpitations and Other (See Comments)    Light headed and sweaty; "my heart races" (04/28/2012), pt states too much morphine gave him this reaction  . Levaquin [Levofloxacin] Itching    Social History:  The patient  reports that he has never smoked. He has never used smokeless tobacco. He reports that he does not drink alcohol or use drugs.   ROS:  Please see the history of present illness.    All other ROS NEG.  PHYSICAL EXAM: VS:  BP 114/60   Pulse (!) 58   Ht 6\' 2"  (1.88 m)   Wt 169 lb (76.7 kg)   BMI 21.70 kg/m  GEN: Thin in no acute distress  HEENT: normal  Neck: no JVD, carotid bruits, or masses Cardiac: RRR; no murmurs, rubs, or gallops,no edema  Respiratory: Poor air movement bilaterally, normal work of  breathing GI: soft, nontender, nondistended, + BS MS: no deformity or atrophy  Skin: warm and dry, no rash Neuro:  Alert and Oriented x 3, Strength and sensation are intact Psych: euthymic mood, full affect    EKG:  EKG was ordered today.  10/28/2017-sinus bradycardia 58 with no other abnormalities.  10/07/16-sinus rhythm 78 with no other abnormalities personally viewed-On 10/10/15-sinus rhythm, 78, right atrial enlargement personally viewed-no change from prior 03/28/14-sinus rhythm, 68, no other abnormalities, previousSinus bradycardia rate 55 with no other abnormalities. Normal intervals. No change from prior.    Labs: 10/19/13-LDL 73  ASSESSMENT AND PLAN:  1. Paroxysmal atrial fibrillation-no recent symptomatic episodes, last in 2016. Continue with diltiazem 180. He has diltiazem 30 mg for breakthrough. He has not needed to take this. Certainly this can be contributing somewhat to his intermittent edema as a side effect. This seems to be pretty benign.  We will continue to monitor. No anticoagulation because of CHADSVAS 1.  We had lengthy discussion today about potentially choosing anticoagulation given his score of 1 however he wishes to hold off until 52 which is reasonable. Appreciate previous visit by atrial fibrillation clinic. If he does feel rapid ventricular response, he knows to take an extra diltiazem. Stable.  No changes in medications.  Doing quite well. 2. Shortness of breath- likely mostly COPD contribution.  Nonetheless, we will check an echocardiogram.  It has been since 2013.  Last EF was 50. 3. COPD-Dr. Annamaria Boots.  prednisone.  No active wheezing heard on exam today.  Poor air movement.  Obviously this is playing a role as well and his shortness of breath. 4. Coronary artery calcification noted in LAD and RCA, increasing atorvastatin to 20 mg from 10.  His HDL is 103, LDL is 87. 5. Continue with lipid modification. Atorvastatin increased to 20 from 10 mg. doing very well, no  myalgias. 6. Post PTX, left - Dr. Prescott Gum post lumbar fusion. Stable. 7. 25-month follow-up Cecille Rubin, 1 year with me  Signed, Candee Furbish, MD [facility]  10/28/2017 10:01 AM

## 2017-11-03 ENCOUNTER — Ambulatory Visit (HOSPITAL_COMMUNITY): Payer: Medicare Other | Attending: Cardiology

## 2017-11-03 ENCOUNTER — Other Ambulatory Visit: Payer: Self-pay

## 2017-11-03 DIAGNOSIS — R0602 Shortness of breath: Secondary | ICD-10-CM | POA: Diagnosis not present

## 2017-11-03 DIAGNOSIS — I517 Cardiomegaly: Secondary | ICD-10-CM | POA: Insufficient documentation

## 2017-11-03 DIAGNOSIS — E785 Hyperlipidemia, unspecified: Secondary | ICD-10-CM | POA: Diagnosis not present

## 2017-11-03 DIAGNOSIS — I480 Paroxysmal atrial fibrillation: Secondary | ICD-10-CM | POA: Diagnosis not present

## 2017-11-03 DIAGNOSIS — I429 Cardiomyopathy, unspecified: Secondary | ICD-10-CM | POA: Diagnosis not present

## 2017-12-01 ENCOUNTER — Ambulatory Visit (INDEPENDENT_AMBULATORY_CARE_PROVIDER_SITE_OTHER): Payer: Medicare Other | Admitting: Internal Medicine

## 2017-12-01 ENCOUNTER — Encounter: Payer: Self-pay | Admitting: Internal Medicine

## 2017-12-01 VITALS — BP 110/80 | HR 59 | Ht 74.0 in | Wt 160.0 lb

## 2017-12-01 DIAGNOSIS — K219 Gastro-esophageal reflux disease without esophagitis: Secondary | ICD-10-CM | POA: Diagnosis not present

## 2017-12-01 DIAGNOSIS — I480 Paroxysmal atrial fibrillation: Secondary | ICD-10-CM | POA: Diagnosis not present

## 2017-12-01 DIAGNOSIS — J4530 Mild persistent asthma, uncomplicated: Secondary | ICD-10-CM

## 2017-12-01 MED ORDER — IPRATROPIUM-ALBUTEROL 0.5-2.5 (3) MG/3ML IN SOLN: RESPIRATORY_TRACT | 12 refills | Status: DC

## 2017-12-01 MED ORDER — COMPRESSOR/NEBULIZER MISC: 0 refills | Status: DC

## 2017-12-01 NOTE — Patient Instructions (Signed)
Ok to continue present meds  Scripts printed for compressor nebulizer and neb solution through DME.  1 neb treatment every 6 hours, in addition to your regular meds, if needed.

## 2017-12-01 NOTE — Progress Notes (Signed)
Subjective:    Patient ID: Levi [name], male    DOB: 09/03/1945, 72 y.o.   MRN: [id number] HPI  Male never smoker followed for allergy/asthma, rhinitis, history  ABPA, complicated by PAfib, GERD PFT- 01/04/2015-minimal restriction, minimal diffusion defect, insignificant response to bronchodilator. FVC 4.55/86%, FEV1 3.28/83%, FEV1/FVC 0.72, TLC 75%, DLCO 77 Office Spirometry 10/25/16-mild restriction of exhaled volume, minimal obstruction. FVC 4.04/78%, FEV1 2.71/71%, ratio 0.67, FEF 25-75% 1.68/58% Respiratory allergy profile 10/26/15- total IgE 60, minor elevations for dust mites and Aspergillus fumigatus ---------------------------------------------------------------------------------------------------.  07/31/17- 72 year old male never smoker followed for allergy/asthma, rhinitis, history ABPA, complicated by PAfib ----Pt reports asthma is controlled, but legs and hands swelling in last 2 months. Stiverdi Respimat, prednisone 10  Mg daily, Proventil hfa, Qvar 80,  Spinal surgery in November complicated by left pneumothorax/chest tube, now resolved. In the past, his asthma control has been dependent on maintenance prednisone-Dr. Gwenette Greet told him to stay on it.  He also has felt he needed Qvar 80 rather than Qvar 40. He has had some occasional edema in his ankles, possibly related to his cardiac status and medication. CXR 05/13/17- IMPRESSION: No active cardiopulmonary disease.  No pneumothorax.  12/01/2017-  72 year old male never smoker followed for allergy/asthma, rhinitis, history ABPA, complicated by PAfib, GERD -----FOLLOWS FOR: Breathing is worse since last OV. Reports increased SOB and wheezing. Denies chest tightness or coughing at this time. Stiverdi Respimat, prednisone 10 mg daily, pro-air HFA, Qvar RediHaler, Cardiology tells him EF 35-40%.  He uses Lasix as needed for ankle edema and notices increased dyspnea if leg swell and also if weather is humid. Continues to say he feels  better using both Qvar 40 and prednisone maintenance.  Failed Trelegy trial.  Still exercises at gym regularly.  Denies acute events. Continues pantoprazole 40 mg for GERD.  I have suggested he have this problem managed through his primary physician.  ROS-see HPI   + = positive Constitutional:    weight loss, night sweats, fevers, chills, fatigue, lassitude. HEENT:    headaches, difficulty swallowing, tooth/dental problems, sore throat,       sneezing, itching, ear ache, nasal congestion, post nasal drip, snoring CV:    chest pain, orthopnea, PND, +swelling in lower extremities, anasarca,                                                   dizziness, palpitations Resp:   + shortness of breath with exertion or at rest.                productive cough,   non-productive cough, coughing up of blood.              change in color of mucus.  + wheezing.   Skin:    rash or lesions. GI:  No-   heartburn, indigestion, abdominal pain, nausea, vomiting, diarrhea,                 change in bowel habits, loss of appetite GU: dysuria, change in color of urine, no urgency or frequency.   flank pain. MS:   joint pain, stiffness, decreased range of motion, back pain. Neuro-     nothing unusual Psych:  change in mood or affect.  depression or anxiety.   memory loss.    Objective:  OBJ- Physical Exam General- Alert, Oriented, Affect-appropriate,  Distress- none acute, tall/thin Skin- rash-none, lesions- none, excoriation- none Lymphadenopathy- none Head- atraumatic            Eyes- Gross vision intact, PERRLA, conjunctivae and secretions clear            Ears- Hearing, canals-normal            Nose- Clear, no-Septal dev, mucus, polyps, erosion, perforation             Throat- Mallampati II , mucosa clear , drainage- none, tonsils- atrophic Neck- flexible , trachea midline, no stridor , thyroid nl, carotid no bruit Chest - symmetrical excursion , unlabored           Heart/CV- RRR , no murmur , no gallop  ,  no rub, nl s1 s2                           - JVD- none , edema- none, stasis changes- none, varices+ Left calf           Lung- clear to P&A, wheeze- none, cough- none , dullness-none, rub- none           Chest wall-  Abd-  Br/ Gen/ Rectal- Not done, not indicated Extrem- cyanosis- none, clubbing, none, atrophy- none, strength- nl Neuro- grossly intact to observation    Assessment & Plan:

## 2017-12-03 DIAGNOSIS — K219 Gastro-esophageal reflux disease without esophagitis: Secondary | ICD-10-CM | POA: Insufficient documentation

## 2017-12-03 NOTE — Assessment & Plan Note (Signed)
We may have originally filled his pantoprazole.  This problem will be more properly managed by his primary provider.

## 2017-12-03 NOTE — Assessment & Plan Note (Signed)
Rhythm feels like RSR at this visit.  He is followed by cardiology.  Low ejection fraction is expected to contribute to exertional dyspnea.

## 2017-12-03 NOTE — Assessment & Plan Note (Addendum)
He realizes changes in his fluid balance related to heart function have primary impact on his dyspnea and exercise tolerance.  He notices little wheezing no significant exacerbation.  Rare need for rescue inhaler.  He does say he cannot tolerate less than 10 mg of prednisone daily and we assume adrenal insufficiency by this time. Plan-add nebulizer machine for use if needed during exacerbation as discussed.

## 2017-12-04 NOTE — Addendum Note (Signed)
Addended by: Clayborne Dana C on: 12/04/2017 12:54 PM   Modules accepted: Orders

## 2017-12-05 ENCOUNTER — Telehealth: Payer: Self-pay | Admitting: Internal Medicine

## 2017-12-05 MED ORDER — IPRATROPIUM-ALBUTEROL 0.5-2.5 (3) MG/3ML IN SOLN: RESPIRATORY_TRACT | 12 refills | Status: DC

## 2017-12-05 NOTE — Telephone Encounter (Signed)
Rx has been resent to [facility].

## 2017-12-08 ENCOUNTER — Telehealth: Payer: Self-pay | Admitting: Internal Medicine

## 2017-12-08 MED ORDER — IPRATROPIUM-ALBUTEROL 0.5-2.5 (3) MG/3ML IN SOLN: RESPIRATORY_TRACT | 12 refills | Status: DC

## 2017-12-08 NOTE — Telephone Encounter (Signed)
Called pt and spoke with pt's spouse Bryson Ha who stated when pt went to go to [facility] to pick up neb machine and neb sol, he was told that the solution needed to come from their local pharmacy.  Verified pt's preferred pharmacy and sent script to pharmacy for pt.  Nothing further needed.

## 2017-12-09 ENCOUNTER — Telehealth: Payer: Self-pay | Admitting: Internal Medicine

## 2017-12-09 NOTE — Telephone Encounter (Signed)
Attempted to call [facility]. I did not receive an answer. I have left a message for them to return our call.

## 2017-12-11 NOTE — Telephone Encounter (Signed)
Attempted to contact [facility]. They do not open until 8:00am central standard time. Will call back.

## 2017-12-12 NOTE — Telephone Encounter (Signed)
Returned call to [facility], spoke with Richardson Landry, Pharmacist.  They had a question about the amount (25 vials) of Duo neb ordered.  Richardson Landry needed ok to send more then 25 vials, he needed ok to send 1 month supply of Duo Neb to Patient. Nothing further at this time

## 2017-12-18 DIAGNOSIS — M19071 Primary osteoarthritis, right ankle and foot: Secondary | ICD-10-CM | POA: Diagnosis not present

## 2017-12-18 DIAGNOSIS — M19072 Primary osteoarthritis, left ankle and foot: Secondary | ICD-10-CM | POA: Diagnosis not present

## 2017-12-18 DIAGNOSIS — M19042 Primary osteoarthritis, left hand: Secondary | ICD-10-CM | POA: Diagnosis not present

## 2017-12-18 DIAGNOSIS — M19041 Primary osteoarthritis, right hand: Secondary | ICD-10-CM | POA: Diagnosis not present

## 2017-12-30 DIAGNOSIS — M19072 Primary osteoarthritis, left ankle and foot: Secondary | ICD-10-CM | POA: Diagnosis not present

## 2017-12-30 DIAGNOSIS — M19071 Primary osteoarthritis, right ankle and foot: Secondary | ICD-10-CM | POA: Diagnosis not present

## 2018-01-02 ENCOUNTER — Other Ambulatory Visit: Payer: Self-pay | Admitting: Internal Medicine

## 2018-01-13 ENCOUNTER — Ambulatory Visit: Payer: Medicare Other | Admitting: Internal Medicine

## 2018-01-15 DIAGNOSIS — B351 Tinea unguium: Secondary | ICD-10-CM | POA: Diagnosis not present

## 2018-01-15 DIAGNOSIS — M19071 Primary osteoarthritis, right ankle and foot: Secondary | ICD-10-CM | POA: Diagnosis not present

## 2018-01-15 DIAGNOSIS — M216X2 Other acquired deformities of left foot: Secondary | ICD-10-CM | POA: Diagnosis not present

## 2018-01-15 DIAGNOSIS — M19072 Primary osteoarthritis, left ankle and foot: Secondary | ICD-10-CM | POA: Diagnosis not present

## 2018-01-15 DIAGNOSIS — M216X1 Other acquired deformities of right foot: Secondary | ICD-10-CM | POA: Diagnosis not present

## 2018-01-15 DIAGNOSIS — L851 Acquired keratosis [keratoderma] palmaris et plantaris: Secondary | ICD-10-CM | POA: Diagnosis not present

## 2018-01-27 DIAGNOSIS — M79672 Pain in left foot: Secondary | ICD-10-CM | POA: Diagnosis not present

## 2018-01-27 DIAGNOSIS — M79671 Pain in right foot: Secondary | ICD-10-CM | POA: Diagnosis not present

## 2018-02-02 ENCOUNTER — Ambulatory Visit: Payer: Medicare Other | Admitting: Internal Medicine

## 2018-02-03 DIAGNOSIS — H35411 Lattice degeneration of retina, right eye: Secondary | ICD-10-CM | POA: Diagnosis not present

## 2018-02-03 DIAGNOSIS — H43812 Vitreous degeneration, left eye: Secondary | ICD-10-CM | POA: Diagnosis not present

## 2018-02-03 DIAGNOSIS — H35432 Paving stone degeneration of retina, left eye: Secondary | ICD-10-CM | POA: Diagnosis not present

## 2018-02-03 DIAGNOSIS — H3581 Retinal edema: Secondary | ICD-10-CM | POA: Diagnosis not present

## 2018-02-04 DIAGNOSIS — M791 Myalgia, unspecified site: Secondary | ICD-10-CM | POA: Diagnosis not present

## 2018-02-04 DIAGNOSIS — Z79899 Other long term (current) drug therapy: Secondary | ICD-10-CM | POA: Diagnosis not present

## 2018-02-18 DIAGNOSIS — M25551 Pain in right hip: Secondary | ICD-10-CM | POA: Diagnosis not present

## 2018-02-25 DIAGNOSIS — M79671 Pain in right foot: Secondary | ICD-10-CM | POA: Diagnosis not present

## 2018-02-25 DIAGNOSIS — M79672 Pain in left foot: Secondary | ICD-10-CM | POA: Diagnosis not present

## 2018-03-17 DIAGNOSIS — R972 Elevated prostate specific antigen [PSA]: Secondary | ICD-10-CM | POA: Diagnosis not present

## 2018-03-19 ENCOUNTER — Other Ambulatory Visit: Payer: Self-pay | Admitting: Cardiology

## 2018-03-19 DIAGNOSIS — M47814 Spondylosis without myelopathy or radiculopathy, thoracic region: Secondary | ICD-10-CM | POA: Diagnosis not present

## 2018-03-23 DIAGNOSIS — M542 Cervicalgia: Secondary | ICD-10-CM | POA: Diagnosis not present

## 2018-03-23 DIAGNOSIS — M546 Pain in thoracic spine: Secondary | ICD-10-CM | POA: Diagnosis not present

## 2018-03-23 DIAGNOSIS — M6281 Muscle weakness (generalized): Secondary | ICD-10-CM | POA: Diagnosis not present

## 2018-03-24 DIAGNOSIS — N400 Enlarged prostate without lower urinary tract symptoms: Secondary | ICD-10-CM | POA: Diagnosis not present

## 2018-03-25 DIAGNOSIS — M546 Pain in thoracic spine: Secondary | ICD-10-CM | POA: Diagnosis not present

## 2018-03-25 DIAGNOSIS — M6281 Muscle weakness (generalized): Secondary | ICD-10-CM | POA: Diagnosis not present

## 2018-03-25 DIAGNOSIS — M542 Cervicalgia: Secondary | ICD-10-CM | POA: Diagnosis not present

## 2018-03-31 DIAGNOSIS — R202 Paresthesia of skin: Secondary | ICD-10-CM | POA: Diagnosis not present

## 2018-03-31 DIAGNOSIS — R42 Dizziness and giddiness: Secondary | ICD-10-CM | POA: Diagnosis not present

## 2018-04-01 DIAGNOSIS — M546 Pain in thoracic spine: Secondary | ICD-10-CM | POA: Diagnosis not present

## 2018-04-01 DIAGNOSIS — M6281 Muscle weakness (generalized): Secondary | ICD-10-CM | POA: Diagnosis not present

## 2018-04-01 DIAGNOSIS — M542 Cervicalgia: Secondary | ICD-10-CM | POA: Diagnosis not present

## 2018-04-03 ENCOUNTER — Ambulatory Visit (INDEPENDENT_AMBULATORY_CARE_PROVIDER_SITE_OTHER): Payer: Medicare Other | Admitting: Internal Medicine

## 2018-04-03 ENCOUNTER — Encounter: Payer: Self-pay | Admitting: Internal Medicine

## 2018-04-03 DIAGNOSIS — M542 Cervicalgia: Secondary | ICD-10-CM | POA: Diagnosis not present

## 2018-04-03 DIAGNOSIS — J4530 Mild persistent asthma, uncomplicated: Secondary | ICD-10-CM

## 2018-04-03 DIAGNOSIS — K219 Gastro-esophageal reflux disease without esophagitis: Secondary | ICD-10-CM

## 2018-04-03 DIAGNOSIS — M6281 Muscle weakness (generalized): Secondary | ICD-10-CM | POA: Diagnosis not present

## 2018-04-03 MED ORDER — OLODATEROL HCL 2.5 MCG/ACT IN AERS: INHALATION_SPRAY | RESPIRATORY_TRACT | 12 refills | Status: DC

## 2018-04-03 NOTE — Patient Instructions (Signed)
Flu shot logged in  Script sent refilling Striverdi  Glad you are doing well. Please call if we can help.

## 2018-04-03 NOTE — Assessment & Plan Note (Signed)
He was sensitive to hot humid summer weather but is doing very well now.  We will refill Striverdi inhaler but otherwise medications are good.  He remains dependent on low-dose maintenance prednisone, now with adrenal insufficiency assumed.  While taking this, he does not need to change from LABA to LABA/ICS.

## 2018-04-03 NOTE — Assessment & Plan Note (Signed)
Reinforced long-term attention to reflux control measures.

## 2018-04-03 NOTE — Progress Notes (Signed)
Subjective:    Patient ID: Levi [name], male    DOB: [birth year], 72 y.o.   MRN: [id number] HPI  Male never smoker followed for allergy/asthma, rhinitis, history  ABPA, complicated by PAfib, GERD PFT- 01/04/2015-minimal restriction, minimal diffusion defect, insignificant response to bronchodilator. FVC 4.55/86%, FEV1 3.28/83%, FEV1/FVC 0.72, TLC 75%, DLCO 77 Office Spirometry 10/25/16-mild restriction of exhaled volume, minimal obstruction. FVC 4.04/78%, FEV1 2.71/71%, ratio 0.67, FEF 25-75% 1.68/58% Respiratory allergy profile 10/26/15- total IgE 60, minor elevations for dust mites and Aspergillus fumigatus ---------------------------------------------------------------------------------------------------. 12/01/2017-  72 year old male never smoker followed for allergy/asthma, rhinitis, history ABPA, complicated by PAfib, GERD -----FOLLOWS FOR: Breathing is worse since last OV. Reports increased SOB and wheezing. Denies chest tightness or coughing at this time. Stiverdi Respimat, prednisone 10 mg daily, pro-air HFA, Qvar RediHaler, Cardiology tells him EF 35-40%.  He uses Lasix as needed for ankle edema and notices increased dyspnea if leg swell and also if weather is humid. Continues to say he feels better using both Qvar 40 and prednisone maintenance.  Failed Trelegy trial.  Still exercises at gym regularly.  Denies acute events. Continues pantoprazole 40 mg for GERD.  I have suggested he have this problem managed through his primary physician.  04/03/2018-  72 year old male never smoker followed for allergy/asthma, rhinitis, history ABPA, complicated by PAfib, GERD -----4 month follow up for asthma. states his breathing has been ok since his last visit.  Srtiverdi Respimat,  prednisone 10 mg daily, albuterol HFA, neb DuoNeb, Has had flu vaccine.  He is very satisfied with his breathing now that the weather has cooled.  During the summer he needed to use nebulizer and rescue inhaler more.  Now he  denies sleep disturbance and uses rescue inhaler only every day or 2.  ROS-see HPI   + = positive Constitutional:    weight loss, night sweats, fevers, chills, fatigue, lassitude. HEENT:    headaches, difficulty swallowing, tooth/dental problems, sore throat,       sneezing, itching, ear ache, nasal congestion, post nasal drip, snoring CV:    chest pain, orthopnea, PND, +swelling in lower extremities, anasarca,                                                   dizziness, palpitations Resp:   + shortness of breath with exertion or at rest.                productive cough,   non-productive cough, coughing up of blood.              change in color of mucus.  + wheezing.   Skin:    rash or lesions. GI:  No-   heartburn, indigestion, abdominal pain, nausea, vomiting, diarrhea,                 change in bowel habits, loss of appetite GU: dysuria, change in color of urine, no urgency or frequency.   flank pain. MS:   joint pain, stiffness, decreased range of motion, back pain. Neuro-     nothing unusual Psych:  change in mood or affect.  depression or anxiety.   memory loss.    Objective:  OBJ- Physical Exam General- Alert, Oriented, Affect-appropriate, Distress- none acute, tall/thin Skin- rash-none, lesions- none, excoriation- none Lymphadenopathy- none Head- atraumatic  Eyes- Gross vision intact, PERRLA, conjunctivae and secretions clear            Ears- Hearing, canals-normal            Nose- Clear, no-Septal dev, mucus, polyps, erosion, perforation             Throat- Mallampati II , mucosa clear , drainage- none, tonsils- atrophic Neck- flexible , trachea midline, no stridor , thyroid nl, carotid no bruit Chest - symmetrical excursion , unlabored           Heart/CV- RRR , no murmur , no gallop  , no rub, nl s1 s2                           - JVD- none , edema- none, stasis changes- none, varices+ Left calf           Lung- clear to P&A, wheeze- none, cough- none ,  dullness-none, rub- none           Chest wall-  Abd-  Br/ Gen/ Rectal- Not done, not indicated Extrem- cyanosis- none, clubbing, none, atrophy- none, strength- nl Neuro- grossly intact to observation    Assessment & Plan:

## 2018-04-06 DIAGNOSIS — M6281 Muscle weakness (generalized): Secondary | ICD-10-CM | POA: Diagnosis not present

## 2018-04-06 DIAGNOSIS — M546 Pain in thoracic spine: Secondary | ICD-10-CM | POA: Diagnosis not present

## 2018-04-06 DIAGNOSIS — M542 Cervicalgia: Secondary | ICD-10-CM | POA: Diagnosis not present

## 2018-04-10 DIAGNOSIS — R42 Dizziness and giddiness: Secondary | ICD-10-CM | POA: Diagnosis not present

## 2018-04-19 ENCOUNTER — Other Ambulatory Visit: Payer: Self-pay | Admitting: Cardiology

## 2018-04-21 ENCOUNTER — Encounter: Payer: Self-pay | Admitting: Neurology

## 2018-04-21 ENCOUNTER — Ambulatory Visit (INDEPENDENT_AMBULATORY_CARE_PROVIDER_SITE_OTHER): Payer: Medicare Other | Admitting: Neurology

## 2018-04-21 VITALS — BP 121/66 | HR 63 | Ht 74.0 in | Wt 164.0 lb

## 2018-04-21 DIAGNOSIS — G250 Essential tremor: Secondary | ICD-10-CM

## 2018-04-21 DIAGNOSIS — R251 Tremor, unspecified: Secondary | ICD-10-CM | POA: Diagnosis not present

## 2018-04-21 DIAGNOSIS — R42 Dizziness and giddiness: Secondary | ICD-10-CM

## 2018-04-21 DIAGNOSIS — R296 Repeated falls: Secondary | ICD-10-CM | POA: Diagnosis not present

## 2018-04-21 DIAGNOSIS — Z9181 History of falling: Secondary | ICD-10-CM

## 2018-04-21 NOTE — Patient Instructions (Addendum)
You have evidence of orthostatic hypotension, which means that you have a drop in blood pressure when changing positions, such as standing up after sitting for a while.   You have a head and right more than left hand tremor, but not classic findings of parkinson's.   In addition, you have vertigo like symptoms when laying in bed.  Unfortunately, there is no medicine, that may help all your symptoms.   We will do a brain scan, called MRI and call you with the test results. We will have to schedule you for this on a separate date. This test requires authorization from your insurance, and we will take care of the insurance process.  DaT scan: This is a specialized brain scan designed to help with diagnosis of tremor disorders. A radioactive marker gets injected and the uptake is measured in the brain and compared to normal controls and right side is compared to the left, a change in uptake can help with diagnosis of certain tremor disorders. A brain MRI on the other hand is a brain scan that helps look at the brain structure in more detail overall and look for age-related changes, blood vessel related changes and look for stroke and volume loss which we call atrophy.   Unfortunately, dizziness is a very common complaint but is often not due to a primary neurological reason or single underlying medical problem. Often, there a combination of factors, that result in dizziness. This includes blood pressure fluctuations, medication side effects, blood sugar fluctuations, stress, vertigo, poor sleep with sleep deprivation, dehydration, and electrolyte disturbance or other metabolic and endocrinological reasons, meaning hormone related problems such as thyroid dysfunction. We will investigate things further with those 2 scans.   Please stay well hydrated with water and change positions slowly, also talk to your cardiologist about your blood pressure drop.

## 2018-04-21 NOTE — Progress Notes (Addendum)
Subjective:    Patient ID: Levi [name] is a 72 y.o. male.  HPI     [name], MD, PhD [name] [address], [address] [place], [place] [address]  Dear [name],   I saw your patient, Levi [name], upon your kind request in my neurologic clinic today for initial consultation of his dizziness and tremors. The patient is unaccompanied today. As you know, [name] is a 72 year old right-handed gentleman with an underlying complex medical history of paroxysmal A. fib, COPD, reflux disease, hyperlipidemia, allergic rhinitis, coronary artery disease, gout, degenerative joint disease, anxiety, history of spinal stenosis, status post multiple surgeries including bilateral knee surgeries, foot surgeries, tonsillectomy, cholecystectomy, back surgeries, shoulder surgeries, neck surgery, and status post total hip replacement b/l, hardware in the back, who reports a sense of dizziness and intermittent nausea for the past 2-3 months. He reports intermittent vertiginous symptoms when laying in bed but has had lightheadedness when sitting and standing. He has had multiple falls, he has significant mobility issues, multiple joint related and back surgeries. He has significant degenerative disease of the entire spine, sees [name].  I reviewed your office note from 04/10/2018, which you kindly included. He was treated for vertigo symptoms with meclizine 12.5 mg 3 times a day for about 10 days. His wife has noticed a tremor. He has a family history of Parkinson's disease on his maternal side, 2 aunts, as he recalls, no family history of essential tremor. He tried meclizine but did not find it helpful. Of note, he is on a nebulizer as needed as well as albuterol as needed. He is on chronic prednisone, currently 10 mg daily. He tries to hydrate well but likes to drink diet soda, estimates that he can drink half a gallon of diet orange soda. He has a family history of stroke  in his father who died at the age of 6 from a heart attack but also had a stroke. Paternal grandparents had strokes. Mom died at age 34 from diabetes complications. He has had a tremor affecting his right more than left hand and his head and neck area for at least one year, likely longer he believes. He is retired, lives with his wife. He does not smoke, does not currently drink alcohol.  His Past Medical History Is Significant For: Past Medical History:  Diagnosis Date  . Agent orange exposure 1970's   takes Imdur,Diltiazem, and Atacand daily  . ALLERGIC RHINITIS   . Asthma   . Bruises easily    d/t prednisone daily  . Chronic bronchitis ([facility])    "used to get it q yr; last time ?2008" (04/28/2012)  . COPD (chronic obstructive pulmonary disease) ([facility])    agent orange exposure  . Degenerative disk disease    "qwhere" (04/28/2012)  . Diverticulitis   . Dysrhythmia    afib  . Elevated uric acid in blood    takes Allopurinol daily  . Enlarged prostate    but not on any meds  . GERD (gastroesophageal reflux disease)    takes Protonix daily  . H/O hiatal hernia   . Headache   . History of pneumonia    a. 2010  . Hyperlipidemia    takes Lipitor daily; pt. states he takes a preventive  . Joint pain   . Joint swelling   . PAF (paroxysmal atrial fibrillation) ([facility])    a. Dx 04/2012, CHA2DS2VASc = 1 (age);  b. 04/2012 Echo: EF 40-50%, mild  MR.  . Personal history of colonic adenomas 02/09/2013  . Shortness of breath dyspnea     His Past Surgical History Is Significant For: Past Surgical History:  Procedure Laterality Date  . ANTERIOR CERVICAL DECOMP/DISCECTOMY FUSION N/A 07/21/2012   Procedure: ANTERIOR CERVICAL DECOMPRESSION/DISCECTOMY FUSION 2 LEVELS;  Surgeon: [name], MD;  Location: [facility];  Service: Neurosurgery;  Laterality: N/A;  C4-5 C5-6 Anterior cervical decompression/diskectomy/fusion  . ANTERIOR LAT LUMBAR FUSION N/A 04/07/2017   Procedure: Thoracic  twelve-Lumbar one Anterolateral decompression/fusion;  Surgeon: [name], MD;  Location: [facility];  Service: Neurosurgery;  Laterality: N/A;  . BACK SURGERY     x 3  . CARDIAC CATHETERIZATION  11/2008   [name] - 20% calcified non flow limiting left main, 50% EF apical hypokinesis  . CERVICAL DISC ARTHROPLASTY N/A 04/07/2017   Procedure: Cervical six-seven Disc arthroplasty;  Surgeon: [name], MD;  Location: [facility];  Service: Neurosurgery;  Laterality: N/A;  . CHEST TUBE INSERTION Left 04/11/2017   Procedure: CHEST TUBE INSERTION;  Surgeon: [name], MD;  Location: [facility];  Service: Thoracic;  Laterality: Left;  . CHOLECYSTECTOMY    . COLONOSCOPY    . ESOPHAGOGASTRODUODENOSCOPY    . EYE SURGERY Bilateral 2011   steroidal encapsulation" (04/28/2012)  . FOOT SURGERY Right    x 2  . KNEE ARTHROSCOPY Bilateral   . LATERAL / POSTERIOR COMBINED FUSION LUMBAR SPINE  2012  . REVERSE SHOULDER ARTHROPLASTY  04/28/2012   Procedure: REVERSE SHOULDER ARTHROPLASTY;  Surgeon: [name], MD;  Location: [facility];  Service: Orthopedics;  Laterality: Left;  Left shouder reverse total shoulder arthroplasty  . SHOULDER SURGERY    . [place]  . TOTAL HIP ARTHROPLASTY Left 2011   "left" (04/28/2012)  . TOTAL HIP ARTHROPLASTY Right 07/31/2015   Procedure: TOTAL HIP ARTHROPLASTY ANTERIOR APPROACH;  Surgeon: [name], MD;  Location: [facility];  Service: Orthopedics;  Laterality: Right;  . TOTAL KNEE ARTHROPLASTY  01/10/2012   Procedure: TOTAL KNEE ARTHROPLASTY;  Surgeon: [name], MD;  Location: [facility];  Service: Orthopedics;;  left total knee arthroplasty  . TOTAL KNEE ARTHROPLASTY Right 06/17/2014   Procedure: TOTAL KNEE ARTHROPLASTY;  Surgeon: [name], MD;  Location: [facility];  Service: Orthopedics;  Laterality: Right;    His Family History Is Significant For: Family History  Problem Relation Age of Onset  . Heart disease Father        Died 74, MI   . Diabetes Mother   . Prostate cancer Brother   . Colon cancer Neg Hx   . Esophageal cancer Neg Hx   . Rectal cancer Neg Hx   . Stomach cancer Neg Hx   . Pancreatic cancer Neg Hx   . Kidney disease Neg Hx   . Liver disease Neg Hx     His Social History Is Significant For: Social History   Socioeconomic History  . Marital status: Married    Spouse name: Not on file  . Number of children: Not on file  . Years of education: Not on file  . Highest education level: Not on file  Occupational History  . 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 Financial resource strain: Not on file  . Food insecurity:    Worry: Not on file    Inability: Not on file  .  Transportation needs:    Medical: Not on file    Non-medical: Not on file  Tobacco Use  . Smoking status: Never Smoker  . Smokeless tobacco: Never Used  Substance and Sexual Activity  . Alcohol use: No    Alcohol/week: 0.0 standard drinks  . Drug use: No  . Sexual activity: Yes  Lifestyle  . Physical activity:    Days per week: Not on file    Minutes per session: Not on file  . Stress: Not on file  Relationships  . Social connections:    Talks on phone: Not on file    Gets together: Not on file    Attends religious service: Not on file    Active member of club or organization: Not on file    Attends meetings of clubs or organizations: Not on file    Relationship status: Not on file  Other Topics Concern  . 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 Morphine And Related Palpitations and Other (See Comments)    Light headed and sweaty; "my heart races" (04/28/2012), pt states too much morphine gave him this reaction   . Levaquin [Levofloxacin] Itching  . Dicloxacillin   . Fluticasone-Salmeterol   . Lisinopril   :   His Current Medications Are:  Outpatient Encounter Medications as of 04/21/2018  Medication Sig  . albuterol (PROAIR HFA) 108 (90 Base) MCG/ACT inhaler Inhale 1-2 puffs into the lungs every 6 (six) hours as needed for wheezing or shortness of breath.  . allopurinol (ZYLOPRIM) 100 MG tablet Take 100 mg by mouth daily.    [name] aspirin EC 81 MG tablet Take 1 tablet (81 mg total) by mouth daily.  [name] atorvastatin (LIPITOR) 20 MG tablet Take 1 tablet (20 mg total) by mouth daily.  . candesartan (ATACAND) 4 MG tablet TAKE 1/2 TABLETS (2 MG TOTAL) BY MOUTH DAILY.  [name] diltiazem (CARDIZEM CD) 180 MG 24 hr capsule TAKE 1 CAPSULE (180 MG TOTAL) BY MOUTH DAILY.  [name] diltiazem (CARDIZEM) 30 MG tablet Take 1 tablet every 4 hours AS NEEDED for afib HR >100 and BP >100  . ipratropium-albuterol (DUONEB) 0.5-2.5 (3) MG/3ML SOLN 1 neb every 6 hours and as needed  . isosorbide mononitrate (IMDUR) 30 MG 24 hr tablet TAKE 1 TABLET (30 MG TOTAL) BY MOUTH DAILY.  . meclizine (ANTIVERT) 12.5 MG tablet Take 12.5 mg by mouth 3 (three) times daily as needed for dizziness.  . Nebulizers (COMPRESSOR/NEBULIZER) MISC Use as directed  . Olodaterol HCl (STRIVERDI RESPIMAT) 2.5 MCG/ACT AERS INHALE 2 PUFFS INTO THE LUNGS DAILY.  . pantoprazole (PROTONIX) 40 MG tablet TAKE 1 TABLET BY MOUTH TWICE A DAY  . predniSONE (DELTASONE) 10 MG tablet TAKE 1 TABLET (10 MG TOTAL) BY MOUTH DAILY. CONTINUOUS  . QVAR REDIHALER 80 MCG/ACT inhaler Take 2 puffs by mouth 2 (two) times daily.  . tamsulosin (FLOMAX) 0.4 MG CAPS capsule Take 0.4 mg daily with supper by mouth.    No facility-administered encounter medications on file as of 04/21/2018.   : Review of Systems:  Out of a complete 14 point review of systems, all are reviewed and negative with the exception of these symptoms as listed below:  Review of Systems  Neurological:       Pt  presents  today to discuss his occasional dizziness. Pt tried meclizine but didn't notice much improvement. Pt has dizzy spells daily. Pt notices a tremor in his right hand and his head. Pt is right handed.    Objective:  Neurological Exam  Physical Exam Physical Examination:   Vitals:   04/21/18 0945  BP: 121/66  Pulse: 63   General Examination: The patient is a very pleasant 72 y.o. male in no acute distress. He appears well-developed and well-nourished and well groomed.   On orthostatic testing, lying blood pressure and pulse is 132/66 with pulse of 63, sitting 121/66 with pulse of 63, standing 109/60 with pulse of 65. He has nonspecific lightheadedness but no vertigo upon changing head or body position.  HEENT: Normocephalic, atraumatic, pupils are equal, round and reactive to light and accommodation. Funduscopic exam is difficult due to head tremor. He has a mostly side-to-side head tremor, variable amplitude, fairly intermittent. Hearing is mildly impaired. Face is symmetric, no obvious facial masking, nuchal tone is fairly good. He does have limitation to neck movements, reports having hardware in place in the neck.  Airway examination reveals mild to moderate mouth dryness, no abnormal tongue or mouth movements, no lip tremor dental implants in place. Tongue protrudes centrally and palate elevates symmetrically.   Chest: Clear to auscultation without wheezing, rhonchi or crackles noted.  Heart: S1+S2+0, regular and normal without murmurs, rubs or gallops noted.   Abdomen: Soft, non-tender and non-distended with normal bowel sounds appreciated on auscultation.  Extremities: There is no pitting edema in the distal lower extremities bilaterally.   Skin: Warm and dry without trophic changes noted.  Musculoskeletal: exam reveals Significant limitation in joint mobility in both hips and knees, also in the right ankle.  Neurologically:  Mental status: The patient is awake, alert and  oriented in all 4 spheres. His immediate and remote memory, attention, language skills and fund of knowledge are appropriate. There is no evidence of aphasia, agnosia, apraxia or anomia. Speech is clear with normal prosody and enunciation. Thought process is linear. Mood is normal and affect is normal.  Cranial nerves II - XII are as described above under HEENT exam.  On 04/21/2018: On Archimedes spiral drawing he has coarse difficulty with the right and left hand. Handwriting is difficult to read, not particularly tremulous and not micrographic.  Motor exam: thin bulk, global strength of 4 out of 5, he has limited mobility in both hips and knees. He has some back pain. He has an intermittent resting tremor in the right hand. He has a bilateral postural tremor right more than left, no significant action tremor. Fine motor skills with finger taps and foot taps are impaired globally. He has limitation in ankle mobility bilaterally, right more than left. Romberg is not testable safely. Reflexes are trace in the upper extremities and absent in the lower extremities. Cerebellar testing: No dysmetria or intention tremor. Heel to shin is not possible.  Sensory exam: intact to light touch.  Gait, station and balance: He stands with difficultyAnd has to push himself up. He stands wide-based. He walks slowly and cautiously, no shuffling, no festination, fairly preserved arm swing bilaterally. He turns slowly. No walking aid.   Assessment and Plan:   In summary, MOISE FRIDAY is a very pleasant 72 y.o.-year old male with an underlying complex medical history of paroxysmal A. fib, COPD, reflux disease, hyperlipidemia, allergic rhinitis, coronary artery disease, gout, degenerative joint disease, anxiety, history of spinal stenosis, status post multiple surgeries including  bilateral knee surgeries, foot surgeries, tonsillectomy, cholecystectomy, back surgeries, shoulder surgeries, neck surgery, and status post total  hip replacement b/l, hardware in the back, who presents for evaluation of his dizziness and tremors. He reports a longer standing history of head and hand tremors, mostly right hand tremor. For the past 2 or 3 months he has had dizziness, lightheadedness and sometimes vertiginous symptoms. On examination, he does not have telltale parkinsonism. He does have a resting tremor in bilateral upper extremity postural tremor as well as a head and neck tremor. His situation is complicated. He does have orthostatic hypotension with a 20 3. Systolic blood pressure drop as well as a borderline diastolic blood pressure drop of 6 points only. He is advised to talk with his cardiologist about this issue, furthermore, he is advised to stay well-hydrated with water and curb his soda intake with caffeine, and change positions slowly and cautiously. As far as the tremor, it may be in part exacerbated by his COPD medication and being on chronic prednisone. He may have essential tremor. I would like to proceed with a nuclear medicine SPECT scan called DaT which may help tease out the underlying tremor disorder. As far as his symptoms of dizziness, unfortunately, there is not a whole lot I can suggest at this point. I also would like to proceed with a brain MRI without contrast to rule out any structural cause of his tremor. He has a family history of stroke. I would like to see him back in 2-3 months, after testing is done and we will keep him posted as to his scan results in the interim. I answered all his questions today and he was in agreement. Thank you very much for allowing me to participate in the care of this nice patient. If I can be of any further assistance to you please do not hesitate to call me at [phone].  Sincerely,   [name], MD, PhD

## 2018-04-27 ENCOUNTER — Telehealth: Payer: Self-pay | Admitting: Neurology

## 2018-04-27 NOTE — Telephone Encounter (Signed)
Medicare/BCBS Auth: [employer] spoke to [place] with [facility]. Ref # [id number] U. Pt is scheduled at GI for 05/03/18.

## 2018-04-29 ENCOUNTER — Ambulatory Visit (INDEPENDENT_AMBULATORY_CARE_PROVIDER_SITE_OTHER): Payer: Medicare Other | Admitting: Nurse Practitioner

## 2018-04-29 ENCOUNTER — Encounter: Payer: Self-pay | Admitting: Nurse Practitioner

## 2018-04-29 VITALS — BP 118/76 | HR 76 | Ht 74.0 in | Wt 166.8 lb

## 2018-04-29 DIAGNOSIS — E7800 Pure hypercholesterolemia, unspecified: Secondary | ICD-10-CM | POA: Diagnosis not present

## 2018-04-29 DIAGNOSIS — I480 Paroxysmal atrial fibrillation: Secondary | ICD-10-CM

## 2018-04-29 LAB — BASIC METABOLIC PANEL
BUN/Creatinine Ratio: 22 (ref 10–24)
BUN: 22 mg/dL (ref 8–27)
CO2: 21 mmol/L (ref 20–29)
Calcium: 9.6 mg/dL (ref 8.6–10.2)
Chloride: 105 mmol/L (ref 96–106)
Creatinine, Ser: 1.02 mg/dL (ref 0.76–1.27)
GFR calc Af Amer: 85 mL/min/{1.73_m2} (ref >59)
GFR calc non Af Amer: 74 mL/min/{1.73_m2} (ref >59)
Glucose: 114 mg/dL — ABNORMAL HIGH (ref 65–99)
Potassium: 4.3 mmol/L (ref 3.5–5.2)
Sodium: 141 mmol/L (ref 134–144)

## 2018-04-29 LAB — HEPATIC FUNCTION PANEL
ALT: 29 IU/L (ref 0–44)
AST: 32 IU/L (ref 0–40)
Albumin: 4.4 g/dL (ref 3.5–4.8)
Alkaline Phosphatase: 63 IU/L (ref 39–117)
Bilirubin Total: 0.7 mg/dL (ref 0.0–1.2)
Bilirubin, Direct: 0.2 mg/dL (ref 0.00–0.40)
Total Protein: 6 g/dL (ref 6.0–8.5)

## 2018-04-29 LAB — LIPID PANEL
Chol/HDL Ratio: 1.8 ratio (ref 0.0–5.0)
Cholesterol, Total: 193 mg/dL (ref 100–199)
HDL: 109 mg/dL (ref >39)
LDL Calculated: 67 mg/dL (ref 0–99)
Triglycerides: 84 mg/dL (ref 0–149)
VLDL Cholesterol Cal: 17 mg/dL (ref 5–40)

## 2018-04-29 MED ORDER — DILTIAZEM HCL 30 MG PO TABS: ORAL_TABLET | ORAL | 6 refills | Status: DC

## 2018-04-29 NOTE — Progress Notes (Addendum)
CARDIOLOGY OFFICE NOTE  Date:  04/29/2018    Levi [name] Date of Birth: 1945/10/01 Medical Record #[id number]  PCP:  Orpah Melter, MD  Cardiologist:  Marisa Cyphers    Chief Complaint  Patient presents with  . Atrial Fibrillation    6 month check - seen for [name]    History of Present Illness: Levi [name] is a 72 y.o. male who presents today for a 6 month check. Seen for [name].   He has a history of PAF first notedin December of 2013then again in 2016. He is on low dose CCB therapy and low dose aspirin. Echocardiogram demonstrated EF in the 50% range. Prior catheterization in 2010 showed no flow-limiting CAD. Other issues include COPD, spine surgery, occasional palpitation. He has had agent orange exposure. He had a prior lumbar fusion in 3244 complicated by PTX requiring chest tube placement. He has had CHADSVASC of 1 - has wanted to wait until over 76 to start anticoagulation if needed.   I saw him a year ago. Saw [name] back in June - he has continued to do well. Echo was updated - EF 40 to 45%. No beta blocker due to COPD. Has had some intermittent swelling - probably from CCB therapy. He has remained in NSR.   Comes in today. Here alone. He feels like his heart is doing ok. Rare palpitations. Not using any short acting CCB but needs refilled. No recent lipids. He is seeing neurology - he is having MRI this weekend - concern for stroke - he has had dizziness/tremor - tells me he was not felt to have Parkinson's. BP is good. No chest pain. Breathing is ok for the most part. Still with some occasional swelling - always goes away with elevation of his legs or overnight.   Past Medical History:  Diagnosis Date  . Agent orange exposure 1970's   takes Imdur,Diltiazem, and Atacand daily  . ALLERGIC RHINITIS   . Asthma   . Bruises easily    d/t prednisone daily  . Chronic bronchitis ([facility])    "used to get it q yr; last time ?2008" (04/28/2012)  . COPD  (chronic obstructive pulmonary disease) ([facility])    agent orange exposure  . Degenerative disk disease    "qwhere" (04/28/2012)  . Diverticulitis   . Dysrhythmia    afib  . Elevated uric acid in blood    takes Allopurinol daily  . Enlarged prostate    but not on any meds  . GERD (gastroesophageal reflux disease)    takes Protonix daily  . H/O hiatal hernia   . Headache   . History of pneumonia    a. 2010  . Hyperlipidemia    takes Lipitor daily; pt. states he takes a preventive  . Joint pain   . Joint swelling   . PAF (paroxysmal atrial fibrillation) ([facility])    a. Dx 04/2012, CHA2DS2VASc = 1 (age);  b. 04/2012 Echo: EF 40-50%, mild MR.  . Personal history of colonic adenomas 02/09/2013  . Shortness of breath dyspnea     Past Surgical History:  Procedure Laterality Date  . ANTERIOR CERVICAL DECOMP/DISCECTOMY FUSION N/A 07/21/2012   Procedure: ANTERIOR CERVICAL DECOMPRESSION/DISCECTOMY FUSION 2 LEVELS;  Surgeon: [name], MD;  Location: [facility];  Service: Neurosurgery;  Laterality: N/A;  C4-5 C5-6 Anterior cervical decompression/diskectomy/fusion  . ANTERIOR LAT LUMBAR FUSION N/A 04/07/2017   Procedure: Thoracic twelve-Lumbar one Anterolateral decompression/fusion;  Surgeon: [name], MD;  Location: [facility];  Service: Neurosurgery;  Laterality: N/A;  . BACK SURGERY     x 3  . CARDIAC CATHETERIZATION  11/2008   [name] - 20% calcified non flow limiting left main, 50% EF apical hypokinesis  . CERVICAL DISC ARTHROPLASTY N/A 04/07/2017   Procedure: Cervical six-seven Disc arthroplasty;  Surgeon: [name], MD;  Location: [facility];  Service: Neurosurgery;  Laterality: N/A;  . CHEST TUBE INSERTION Left 04/11/2017   Procedure: CHEST TUBE INSERTION;  Surgeon: [name], MD;  Location: [facility];  Service: Thoracic;  Laterality: Left;  . CHOLECYSTECTOMY    . COLONOSCOPY    . ESOPHAGOGASTRODUODENOSCOPY    . EYE SURGERY Bilateral 2011   steroidal encapsulation" (04/28/2012)    . FOOT SURGERY Right    x 2  . KNEE ARTHROSCOPY Bilateral   . LATERAL / POSTERIOR COMBINED FUSION LUMBAR SPINE  2012  . REVERSE SHOULDER ARTHROPLASTY  04/28/2012   Procedure: REVERSE SHOULDER ARTHROPLASTY;  Surgeon: [name], MD;  Location: [facility];  Service: Orthopedics;  Laterality: Left;  Left shouder reverse total shoulder arthroplasty  . SHOULDER SURGERY    . [place]  . TOTAL HIP ARTHROPLASTY Left 2011   "left" (04/28/2012)  . TOTAL HIP ARTHROPLASTY Right 07/31/2015   Procedure: TOTAL HIP ARTHROPLASTY ANTERIOR APPROACH;  Surgeon: [name], MD;  Location: [facility];  Service: Orthopedics;  Laterality: Right;  . TOTAL KNEE ARTHROPLASTY  01/10/2012   Procedure: TOTAL KNEE ARTHROPLASTY;  Surgeon: [name], MD;  Location: [facility];  Service: Orthopedics;;  left total knee arthroplasty  . TOTAL KNEE ARTHROPLASTY Right 06/17/2014   Procedure: TOTAL KNEE ARTHROPLASTY;  Surgeon: [name], MD;  Location: [facility];  Service: Orthopedics;  Laterality: Right;     Medications: Current Meds  Medication Sig  . albuterol (PROAIR HFA) 108 (90 Base) MCG/ACT inhaler Inhale 1-2 puffs into the lungs every 6 (six) hours as needed for wheezing or shortness of breath.  . allopurinol (ZYLOPRIM) 100 MG tablet Take 100 mg by mouth daily.    [name] aspirin EC 81 MG tablet Take 1 tablet (81 mg total) by mouth daily.  [name] atorvastatin (LIPITOR) 20 MG tablet Take 1 tablet (20 mg total) by mouth daily.  . candesartan (ATACAND) 4 MG tablet TAKE 1/2 TABLETS (2 MG TOTAL) BY MOUTH DAILY.  [name] diltiazem (CARDIZEM CD) 180 MG 24 hr capsule TAKE 1 CAPSULE (180 MG TOTAL) BY MOUTH DAILY.  [name] diltiazem (CARDIZEM) 30 MG tablet Take 1 tablet every 4 hours AS NEEDED for afib HR >100 and BP >100  . ipratropium-albuterol (DUONEB) 0.5-2.5 (3) MG/3ML SOLN 1 neb every 6 hours and as needed  . isosorbide mononitrate (IMDUR) 30 MG 24 hr tablet TAKE 1 TABLET (30 MG TOTAL) BY MOUTH DAILY.  . meclizine  (ANTIVERT) 12.5 MG tablet Take 12.5 mg by mouth 3 (three) times daily as needed for dizziness.  . Nebulizers (COMPRESSOR/NEBULIZER) MISC Use as directed  . Olodaterol HCl (STRIVERDI RESPIMAT) 2.5 MCG/ACT AERS INHALE 2 PUFFS INTO THE LUNGS DAILY.  . pantoprazole (PROTONIX) 40 MG tablet TAKE 1 TABLET BY MOUTH TWICE A DAY  . predniSONE (DELTASONE) 10 MG tablet TAKE 1 TABLET (10 MG TOTAL) BY MOUTH DAILY. CONTINUOUS  . QVAR REDIHALER 80 MCG/ACT inhaler Take 2 puffs by mouth 2 (two) times daily.  . tamsulosin (FLOMAX) 0.4 MG CAPS capsule Take 0.4 mg daily with supper by mouth.   . [DISCONTINUED] diltiazem (CARDIZEM) 30 MG tablet Take  1 tablet every 4 hours AS NEEDED for afib HR >100 and BP >100     Allergies: Allergies  Allergen Reactions  . Morphine And Related Palpitations and Other (See Comments)    Light headed and sweaty; "my heart races" (04/28/2012), pt states too much morphine gave him this reaction  . Levaquin [Levofloxacin] Itching  . Dicloxacillin   . Fluticasone-Salmeterol   . Lisinopril     Social History: The patient  reports that he has never smoked. He has never used smokeless tobacco. He reports that he does not drink alcohol or use drugs.   Family History: The patient's family history includes Diabetes in his mother; Heart disease in his father; Prostate cancer in his brother.   Review of Systems: Please see the history of present illness.   Otherwise, the review of systems is positive for none.   All other systems are reviewed and negative.   Physical Exam: VS:  BP 118/76 (BP Location: Left Arm, Patient Position: Sitting, Cuff Size: Normal)   Pulse 76   Ht 6\' 2"  (1.88 m)   Wt 166 lb 12.8 oz (75.7 kg)   SpO2 97% Comment: at rest  BMI 21.42 kg/m  .  BMI Body mass index is 21.42 kg/m.  Wt Readings from Last 3 Encounters:  04/29/18 166 lb 12.8 oz (75.7 kg)  04/21/18 164 lb (74.4 kg)  04/03/18 163 lb 12.8 oz (74.3 kg)    General: Pleasant. Thin stature. Alert and  in no acute distress.   HEENT: Normal.  Neck: Supple, no JVD, carotid bruits, or masses noted.  Cardiac: Regular rate and rhythm. No murmurs, rubs, or gallops. No edema.  Respiratory:  Lungs are clear to auscultation bilaterally with normal work of breathing.  GI: Soft and nontender.  MS: No deformity or atrophy. Gait and ROM intact.  Skin: Warm and dry. Color is normal.  Neuro:  Strength and sensation are intact and no gross focal deficits noted.  Psych: Alert, appropriate and with normal affect.   LABORATORY DATA:  EKG:  EKG is not ordered today.  Lab Results  Component Value Date   WBC 6.5 04/12/2017   HGB 12.1 (L) 04/12/2017   HCT 36.1 (L) 04/12/2017   PLT 224 04/12/2017   GLUCOSE 88 04/12/2017   CHOL 168 10/20/2014   TRIG 60.0 10/20/2014   HDL 92.50 10/20/2014   LDLCALC 64 10/20/2014   ALT 20 07/25/2015   AST 27 07/25/2015   NA 136 04/12/2017   K 3.4 (L) 04/12/2017   CL 102 04/12/2017   CREATININE 0.70 04/12/2017   BUN 13 04/12/2017   CO2 25 04/12/2017   TSH 1.979 05/10/2012   INR 0.98 07/25/2015       BNP (last 3 results) No results for input(s): BNP in the last 8760 hours.  ProBNP (last 3 results) No results for input(s): PROBNP in the last 8760 hours.   Other Studies Reviewed Today:  Echo Study Conclusions 10/2017  - Left ventricle: The cavity size was normal. Wall thickness was   normal. Systolic function was mildly to moderately reduced. The   estimated ejection fraction was in the range of 40% to 45%.   Diffuse hypokinesis. Features are consistent with a pseudonormal   left ventricular filling pattern, with concomitant abnormal   relaxation and increased filling pressure (grade 2 diastolic   dysfunction). - Right atrium: The atrium was mildly dilated.   Assessment/Plan:  1. PAF - no documented recurrence since 2016 - has opted for aspirin  until he turns 7. CHADSVASC is only 1. Then would consider anticoagulation. He has rare palpitations.   CCB refilled today. He has had some dizziness - there is concern from neurology for stroke- he is having MRI of the brain later this weekend - will review - based on those findings may need to place event monitor and/or reconsider anticoagulation.   2. COPD  3. HLD - on statin - lab today  4. Intermittent swelling - resolves with elevating legs. Weight is stable. Breathing is stable.   5. Mild LV dysfunction - seems compensated to me. Not on ideal therapy but BP is soft and has COPD limiting use of beta blocker. EF has been unchanged since 2013.   Current medicines are reviewed with the patient today.  The patient does not have concerns regarding medicines other than what has been noted above.  The following changes have been made:  See above.  Labs/ tests ordered today include:    Orders Placed This Encounter  Procedures  . Basic metabolic panel  . Hepatic function panel  . Lipid panel     Disposition:   FU with [name] in 6 months. See me in one year.   Patient is agreeable to this plan and will call if any problems develop in the interim.   SignedTruitt Merle, NP  04/29/2018 9:48 AM  [facility] [address] [place], [place]  [address] Phone: [phone] Fax: [phone]       05/06/18  Addendum: I reviewed his MRI from earlier this week - looks like no acute findings.  Will continue with our current plan of care.   Burtis Junes, RN, [facility] [address] [place], [place]  [address] [phone]

## 2018-04-29 NOTE — Patient Instructions (Signed)
We will be checking the following labs today - BMET, HPF and Lipids    Medication Instructions:    Continue with your current medicines.   I refilled the short acting Diltiazem today   If you need a refill on your cardiac medications before your next appointment, please call your pharmacy.     Testing/Procedures To Be Arranged:  N/A  Follow-Up:   See Dr.Skains in 6 months.   See me in one year.     At [facility], you and your health needs are our priority.  As part of our continuing mission to provide you with exceptional heart care, we have created designated Provider Care Teams.  These Care Teams include your primary Cardiologist (physician) and Advanced Practice Providers (APPs -  Physician Assistants and Nurse Practitioners) who all work together to provide you with the care you need, when you need it.  Special Instructions:  . I will be on the lookout for your MRI results - based on what this shows - we may need to put a heart monitor on you. I will be in touch.   Call the [place] office at [phone] if you have any questions, problems or concerns.

## 2018-04-30 ENCOUNTER — Other Ambulatory Visit: Payer: Self-pay | Admitting: Neurology

## 2018-05-03 ENCOUNTER — Ambulatory Visit
Admission: RE | Admit: 2018-05-03 | Discharge: 2018-05-03 | Disposition: A | Discharge status: Home / Self Care | Payer: Medicare Other | Source: Ambulatory Visit | Attending: Neurology | Admitting: Neurology

## 2018-05-03 DIAGNOSIS — R251 Tremor, unspecified: Secondary | ICD-10-CM | POA: Diagnosis not present

## 2018-05-04 ENCOUNTER — Telehealth: Payer: Self-pay | Admitting: Neurology

## 2018-05-04 NOTE — Telephone Encounter (Signed)
Spoke to patient relayed DAT Scan still pending . Patient understood.

## 2018-05-04 NOTE — Telephone Encounter (Signed)
I called pt to discuss his MRI results, no answer, left a message asking him to call me back.

## 2018-05-04 NOTE — Telephone Encounter (Signed)
Called Patient and left a message to call me back.  -- DAT Scan .

## 2018-05-04 NOTE — Progress Notes (Signed)
Brain MRI without contrast shows normal for age findings with the exception of a small area of chronic microhemorrhage on the right side, which means a small area of old blood. This is an old finding and likely clinically insignificant. He may or may not have had any trauma or blood pressure surge at the time, difficult to tell what the reason was. Since his brain volume is normal for age and there are no concerning for acute findings, no follow-up is typically necessary on the brain scan, will proceed with DaT scan, as discussed.  Michel Bickers

## 2018-05-04 NOTE — Telephone Encounter (Signed)
Patient calling regarding scheduling a [place]. If VM please leave a message.

## 2018-05-04 NOTE — Telephone Encounter (Signed)
-----   Message from [name], MD sent at 05/04/2018 10:28 AM EST ----- Brain MRI without contrast shows normal for age findings with the exception of a small area of chronic microhemorrhage on the right side, which means a small area of old blood. This is an old finding and likely clinically insignificant. He may or may not have had any trauma or blood pressure surge at the time, difficult to tell what the reason was. Since his brain volume is normal for age and there are no concerning for acute findings, no follow-up is typically necessary on the brain scan, will proceed with DaT scan, as discussed.  Michel Bickers

## 2018-05-06 NOTE — Telephone Encounter (Signed)
I called pt again to discuss his MRI results. No answer, left a message asking him to call me back. 

## 2018-05-06 NOTE — Telephone Encounter (Signed)
Pt returned my call. I advised him of his MRI results. Pt will proceed with DAT scan and reports that this has not been scheduled but that it is in process. Pt verbalized understanding of results. Pt had no questions at this time but was encouraged to call back if questions arise.

## 2018-05-21 ENCOUNTER — Telehealth: Payer: Self-pay | Admitting: Cardiology

## 2018-05-21 MED ORDER — FUROSEMIDE 20 MG PO TABS: 20.0000 mg | ORAL_TABLET | ORAL | 0 refills | Status: DC

## 2018-05-21 NOTE — Telephone Encounter (Signed)
Spoke with patient who is reporting he is having swelling in his feet and ankles, to mid calf on the right and not as high on the left.  Generally when this occurs the edema resolves on it's own with elevation of his feet however this is not helping with this episode.  He was having edema this was coming and going for 7 to 10 days but now it's pretty much constant.  He reports having some SOB - a little hard to get his breath.  He doesn't know weigh, HR or BP.  In the distance past when this happened he remembers taking Furosemide for a short period of time.  Advised to wear knee high compression stockings, to elevate feet and legs above the level of his heart,  to decrease NA+ intake from his diet as much as possible.  Also advised he should being weighing daily and keeping a dairy.  Pt states understanding.  Advised I will review with MD here in the office today as well.  He is aware I will c/b if any further orders. Of note, last BUN/Crea was 22/1.02 and K+ was 4.3 on 04/29/18

## 2018-05-21 NOTE — Telephone Encounter (Signed)
New message   Pt c/o swelling: STAT is pt has developed SOB within 24 hours  1) How much weight have you gained and in what time span? Patient does not know  2) If swelling, where is the swelling located? Both legs and ankles   3) Are you currently taking a fluid pill? No   4) Are you currently SOB? yes  5) Do you have a log of your daily weights (if so, list)? No   6) Have you gained 3 pounds in a day or 5 pounds in a week? Patient does not know   7) Have you traveled recently? No

## 2018-05-21 NOTE — Telephone Encounter (Signed)
Per Dr Marlou Porch - OK for pt to take Furosemide 20 mg X 3 days.  Pt aware and RX to be sent into his pharmacy [facility].

## 2018-05-22 ENCOUNTER — Other Ambulatory Visit: Payer: Self-pay | Admitting: Internal Medicine

## 2018-05-22 ENCOUNTER — Telehealth: Payer: Self-pay | Admitting: *Deleted

## 2018-05-22 MED ORDER — PANTOPRAZOLE SODIUM 40 MG PO TBEC: 40.0000 mg | DELAYED_RELEASE_TABLET | Freq: Two times a day (BID) | ORAL | 5 refills | Status: DC

## 2018-05-22 NOTE — Telephone Encounter (Signed)
Pt never returned call.  Will close phone note and wait for he to call if problems or concerns.

## 2018-05-22 NOTE — Telephone Encounter (Signed)
Called pt to f/u to make sure he was able to pick up Furosemide and start it.  left message to c/b if Furosemide is not helping.

## 2018-05-25 DIAGNOSIS — M779 Enthesopathy, unspecified: Secondary | ICD-10-CM | POA: Diagnosis not present

## 2018-05-25 DIAGNOSIS — B351 Tinea unguium: Secondary | ICD-10-CM | POA: Diagnosis not present

## 2018-05-25 DIAGNOSIS — M19072 Primary osteoarthritis, left ankle and foot: Secondary | ICD-10-CM | POA: Diagnosis not present

## 2018-05-25 DIAGNOSIS — M19071 Primary osteoarthritis, right ankle and foot: Secondary | ICD-10-CM | POA: Diagnosis not present

## 2018-05-25 DIAGNOSIS — M19079 Primary osteoarthritis, unspecified ankle and foot: Secondary | ICD-10-CM | POA: Diagnosis not present

## 2018-06-01 ENCOUNTER — Telehealth: Payer: Self-pay | Admitting: Cardiology

## 2018-06-01 NOTE — Telephone Encounter (Signed)
Pt calling back because he continues to have increasing SOB and bilateral edema.  He took Furosemide as instructed and it did help however the fluid has reoccurred.  appt scheduled to see Dr Marlou Porch 06/03/2018 at 2:20 pm.  Advised to continue to elevate feet above the level of his heart and monitor his NA= and water intake.  He was grateful for the call back and for the appt.

## 2018-06-01 NOTE — Telephone Encounter (Signed)
Follow Up:     Pt called on 05-22-18 and did as he was  instructed. Pt says he is  still short of breath and legs swollen, would like to be seen please.      Pt c/o swelling: STAT is pt has developed SOB within 24 hours  1) How much weight have you gained and in what time span? Does not think so  2) If swelling, where is the swelling located? Legs and ankles  3) Are you currently taking a fluid pill? Furosemide  4) Are you currently SOB?  yes  5) Do you have a log of your daily weights (if so, list)? no  6) Have you gained 3 pounds in a day or 5 pounds in a week?do not think so  7) Have you traveled recently? no

## 2018-06-03 ENCOUNTER — Encounter: Payer: Self-pay | Admitting: Cardiology

## 2018-06-03 ENCOUNTER — Other Ambulatory Visit: Payer: Self-pay | Admitting: Cardiology

## 2018-06-03 ENCOUNTER — Ambulatory Visit (INDEPENDENT_AMBULATORY_CARE_PROVIDER_SITE_OTHER): Payer: Medicare Other | Admitting: Cardiology

## 2018-06-03 VITALS — BP 110/80 | HR 97 | Ht 74.0 in | Wt 160.1 lb

## 2018-06-03 DIAGNOSIS — R0602 Shortness of breath: Secondary | ICD-10-CM | POA: Diagnosis not present

## 2018-06-03 DIAGNOSIS — I480 Paroxysmal atrial fibrillation: Secondary | ICD-10-CM

## 2018-06-03 MED ORDER — FUROSEMIDE 20 MG PO TABS: 20.0000 mg | ORAL_TABLET | Freq: Every day | ORAL | 3 refills | Status: DC | PRN

## 2018-06-03 NOTE — Progress Notes (Signed)
Cardiology Office Note:    Date:  06/03/2018   ID:  Levi [name], DOB 09-Jan-1946, MRN [id number]  PCP:  Orpah Melter, MD  Cardiologist:  Candee Furbish, MD  Electrophysiologist:  None   Referring MD: Orpah Melter, MD     History of Present Illness:    Levi [name] is a 73 y.o. male here for follow-up of shortness of breath, lower extremity edema.  Has a prior history of paroxysmal atrial fibrillation first in 2013 then again in 2016.  EF 50%.  Cardiac cath 2010 no flow-limiting CAD.  He also has quite significant COPD.  Had some lower extremity edema, Lasix seem to help.  We will give him a as needed prescription.  Shortness of breath sometimes associated with this.  6 weeks ago more dizziness. ?Veritgo. Neuro. MRI ok. Swelling in legs were Turned head in bed room spun.     Past Medical History:  Diagnosis Date  . Agent orange exposure 1970's   takes Imdur,Diltiazem, and Atacand daily  . ALLERGIC RHINITIS   . Asthma   . Bruises easily    d/t prednisone daily  . Chronic bronchitis ([place])    "used to get it q yr; last time ?2008" (04/28/2012)  . COPD (chronic obstructive pulmonary disease) (HCC)    agent orange exposure  . Degenerative disk disease    "qwhere" (04/28/2012)  . Diverticulitis   . Dysrhythmia    afib  . Elevated uric acid in blood    takes Allopurinol daily  . Enlarged prostate    but not on any meds  . GERD (gastroesophageal reflux disease)    takes Protonix daily  . H/O hiatal hernia   . Headache   . History of pneumonia    a. 2010  . Hyperlipidemia    takes Lipitor daily; pt. states he takes a preventive  . Joint pain   . Joint swelling   . PAF (paroxysmal atrial fibrillation) ([place])    a. Dx 04/2012, CHA2DS2VASc = 1 (age);  b. 04/2012 Echo: EF 40-50%, mild MR.  . Personal history of colonic adenomas 02/09/2013  . Shortness of breath dyspnea     Past Surgical History:  Procedure Laterality Date  . ANTERIOR CERVICAL DECOMP/DISCECTOMY FUSION  N/A 07/21/2012   Procedure: ANTERIOR CERVICAL DECOMPRESSION/DISCECTOMY FUSION 2 LEVELS;  Surgeon: [name], MD;  Location: [facility];  Service: Neurosurgery;  Laterality: N/A;  C4-5 C5-6 Anterior cervical decompression/diskectomy/fusion  . ANTERIOR LAT LUMBAR FUSION N/A 04/07/2017   Procedure: Thoracic twelve-Lumbar one Anterolateral decompression/fusion;  Surgeon: [name], MD;  Location: [facility];  Service: Neurosurgery;  Laterality: N/A;  . BACK SURGERY     x 3  . CARDIAC CATHETERIZATION  11/2008   [name] - 20% calcified non flow limiting left main, 50% EF apical hypokinesis  . CERVICAL DISC ARTHROPLASTY N/A 04/07/2017   Procedure: Cervical six-seven Disc arthroplasty;  Surgeon: [name], MD;  Location: [facility];  Service: Neurosurgery;  Laterality: N/A;  . CHEST TUBE INSERTION Left 04/11/2017   Procedure: CHEST TUBE INSERTION;  Surgeon: [name], MD;  Location: [facility];  Service: Thoracic;  Laterality: Left;  . CHOLECYSTECTOMY    . COLONOSCOPY    . ESOPHAGOGASTRODUODENOSCOPY    . EYE SURGERY Bilateral 2011   steroidal encapsulation" (04/28/2012)  . FOOT SURGERY Right    x 2  . KNEE ARTHROSCOPY Bilateral   . LATERAL / POSTERIOR COMBINED FUSION LUMBAR SPINE  2012  . REVERSE SHOULDER ARTHROPLASTY  04/28/2012  Procedure: REVERSE SHOULDER ARTHROPLASTY;  Surgeon: [name], MD;  Location: [facility];  Service: Orthopedics;  Laterality: Left;  Left shouder reverse total shoulder arthroplasty  . SHOULDER SURGERY    . [place]  . TOTAL HIP ARTHROPLASTY Left 2011   "left" (04/28/2012)  . TOTAL HIP ARTHROPLASTY Right 07/31/2015   Procedure: TOTAL HIP ARTHROPLASTY ANTERIOR APPROACH;  Surgeon: [name], MD;  Location: [facility];  Service: Orthopedics;  Laterality: Right;  . TOTAL KNEE ARTHROPLASTY  01/10/2012   Procedure: TOTAL KNEE ARTHROPLASTY;  Surgeon: [name], MD;  Location: [facility];  Service: Orthopedics;;  left total knee arthroplasty   . TOTAL KNEE ARTHROPLASTY Right 06/17/2014   Procedure: TOTAL KNEE ARTHROPLASTY;  Surgeon: [name], MD;  Location: [facility];  Service: Orthopedics;  Laterality: Right;    Current Medications: Current Meds  Medication Sig  . albuterol (PROAIR HFA) 108 (90 Base) MCG/ACT inhaler Inhale 1-2 puffs into the lungs every 6 (six) hours as needed for wheezing or shortness of breath.  . allopurinol (ZYLOPRIM) 100 MG tablet Take 100 mg by mouth daily.    [name] aspirin EC 81 MG tablet Take 1 tablet (81 mg total) by mouth daily.  [name] atorvastatin (LIPITOR) 20 MG tablet Take 1 tablet (20 mg total) by mouth daily.  . candesartan (ATACAND) 4 MG tablet TAKE 1/2 TABLETS (2 MG TOTAL) BY MOUTH DAILY.  [name] diltiazem (CARDIZEM CD) 180 MG 24 hr capsule TAKE 1 CAPSULE (180 MG TOTAL) BY MOUTH DAILY.  [name] diltiazem (CARDIZEM) 30 MG tablet Take 1 tablet every 4 hours AS NEEDED for afib HR >100 and BP >100  . ipratropium-albuterol (DUONEB) 0.5-2.5 (3) MG/3ML SOLN 1 neb every 6 hours and as needed  . isosorbide mononitrate (IMDUR) 30 MG 24 hr tablet TAKE 1 TABLET (30 MG TOTAL) BY MOUTH DAILY.  . Nebulizers (COMPRESSOR/NEBULIZER) MISC Use as directed  . Olodaterol HCl (STRIVERDI RESPIMAT) 2.5 MCG/ACT AERS INHALE 2 PUFFS INTO THE LUNGS DAILY.  . pantoprazole (PROTONIX) 40 MG tablet Take 1 tablet (40 mg total) by mouth 2 (two) times daily.  . predniSONE (DELTASONE) 10 MG tablet TAKE 1 TABLET (10 MG TOTAL) BY MOUTH DAILY. CONTINUOUS  . QVAR REDIHALER 80 MCG/ACT inhaler Take 2 puffs by mouth 2 (two) times daily.  . tamsulosin (FLOMAX) 0.4 MG CAPS capsule Take 0.4 mg daily with supper by mouth.      Allergies:   Morphine and related; Levaquin [levofloxacin]; Dicloxacillin; Fluticasone-salmeterol; and Lisinopril   Social History   Socioeconomic History  . Marital status: Married    Spouse name: Not on file  . Number of children: Not on file  . Years of education: Not on file  . Highest education level: Not on file    Occupational History  . 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 Financial resource strain: Not on file  . Food insecurity:    Worry: Not on file    Inability: Not on file  . Transportation needs:    Medical: Not on file    Non-medical: Not on file  Tobacco Use  . Smoking status: Never Smoker  . Smokeless tobacco: Never Used  Substance and Sexual Activity  . Alcohol use: No    Alcohol/week: 0.0 standard drinks  . Drug use: No  . Sexual activity: Yes  Lifestyle  . Physical activity:  Days per week: Not on file    Minutes per session: Not on file  . Stress: Not on file  Relationships  . Social connections:    Talks on phone: Not on file    Gets together: Not on file    Attends religious service: Not on file    Active member of club or organization: Not on file    Attends meetings of clubs or organizations: Not on file    Relationship status: Not on file  Other Topics Concern  . 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 There is no history of Colon cancer, Esophageal cancer, Rectal cancer, Stomach cancer, Pancreatic cancer, Kidney disease, or Liver disease.  ROS:   Please see the history of present illness.     All other systems reviewed and are negative.  EKGs/Labs/Other Studies Reviewed:    The following studies were reviewed today: Prior office notes lab work echo  EKG:  No new  Recent Labs: 04/29/2018: ALT 29; BUN 22; Creatinine, Ser 1.02; Potassium 4.3; Sodium 141  Recent Lipid Panel    Component Value Date/Time   CHOL 193 04/29/2018 0956   TRIG 84 04/29/2018 0956    HDL 109 04/29/2018 0956   CHOLHDL 1.8 04/29/2018 0956   CHOLHDL 2 10/20/2014 1122   VLDL 12.0 10/20/2014 1122   LDLCALC 67 04/29/2018 0956    Physical Exam:    VS:  BP 110/80   Pulse 97   Ht 6\' 2"  (1.88 m)   Wt 160 lb 1.9 oz (72.6 kg)   SpO2 96%   BMI 20.56 kg/m     Wt Readings from Last 3 Encounters:  06/03/18 160 lb 1.9 oz (72.6 kg)  04/29/18 166 lb 12.8 oz (75.7 kg)  04/21/18 164 lb (74.4 kg)     GEN:  Well nourished, well developed in no acute distress thin HEENT: Normal NECK: No JVD; No carotid bruits LYMPHATICS: No lymphadenopathy CARDIAC: RRR, no murmurs, rubs, gallops RESPIRATORY:  Clear to auscultation without rales, wheezing or rhonchi  ABDOMEN: Soft, non-tender, non-distended MUSCULOSKELETAL:  No edema; No deformity  SKIN: Warm and dry NEUROLOGIC:  Alert and oriented x 3 PSYCHIATRIC:  Normal affect   ASSESSMENT:    1. Paroxysmal atrial fibrillation (HCC)   2. Shortness of breath    PLAN:    In order of problems listed above:  Paroxysmal atrial fibrillation -No documented recurrence since 2016.  He understands that his stroke risk would increase once he gets age 70, score of 2.  Consider anticoagulation strongly then.  His calcium channel blocker has been refilled.  Lower extremity edema -This is been a chronic ongoing paroxysmal issue for him.  This could be multifactorial from calcium channel blocker to COPD resulting in elevated right-sided heart pressures.  I am comfortable with him taking Lasix 20 mg p.o. as needed.  Compression hose as needed.  Could also be from salt load.   Medication Adjustments/Labs and Tests Ordered: Current medicines are reviewed at length with the patient today.  Concerns regarding medicines are outlined above.  No orders of the defined types were placed in this encounter.  Meds ordered this encounter  Medications  . furosemide (LASIX) 20 MG tablet  Sig: Take 1 tablet (20 mg total) by mouth daily as needed. For  shortness of breath and lower limb edema    Dispense:  45 tablet    Refill:  3    Patient Instructions  Medication Instructions:  Your physician has recommended you make the following change in your medication:   1. Begin lasix, 20mg  tablet, once per day as needed for shortness of breath and lower extremity swelling.  Labwork: None ordered.  Testing/Procedures: None ordered.  Follow-Up: Your physician recommends that you schedule a follow-up appointment in:   6 months with [name], NP 12 months with Dr Marlou Porch  Any Other Special Instructions Will Be Listed Below (If Applicable).     If you need a refill on your cardiac medications before your next appointment, please call your pharmacy.     Signed, Candee Furbish, MD  06/03/2018 3:10 PM    [facility]

## 2018-06-03 NOTE — Patient Instructions (Signed)
Medication Instructions:  Your physician has recommended you make the following change in your medication:   1. Begin lasix, 20mg  tablet, once per day as needed for shortness of breath and lower extremity swelling.  Labwork: None ordered.  Testing/Procedures: None ordered.  Follow-Up: Your physician recommends that you schedule a follow-up appointment in:   6 months with [name], NP 12 months with Dr Marlou Porch  Any Other Special Instructions Will Be Listed Below (If Applicable).     If you need a refill on your cardiac medications before your next appointment, please call your pharmacy.

## 2018-06-10 ENCOUNTER — Other Ambulatory Visit: Payer: Self-pay | Admitting: Internal Medicine

## 2018-06-11 ENCOUNTER — Telehealth: Payer: Self-pay | Admitting: Internal Medicine

## 2018-06-11 MED ORDER — PREDNISONE 10 MG PO TABS: 10.0000 mg | ORAL_TABLET | Freq: Every day | ORAL | 5 refills | Status: DC

## 2018-06-11 NOTE — Telephone Encounter (Signed)
Called in Rx to [facility]. I called to confirm that they received it. Rx was received.  Nothing further is needed.    04/03/2018-  73 year old male never smoker followed for allergy/asthma, rhinitis, history ABPA, complicated by PAfib, GERD -----4 month follow up for asthma. states his breathing has been ok since his last visit.  Srtiverdi Respimat,  prednisone 10 mg daily, albuterol HFA, neb DuoNeb, Has had flu vaccine.  He is very satisfied with his breathing now that the weather has cooled.  During the summer he needed to use nebulizer and rescue inhaler more.  Now he denies sleep disturbance and uses rescue inhaler only every day or 2.

## 2018-06-18 DIAGNOSIS — M79671 Pain in right foot: Secondary | ICD-10-CM | POA: Diagnosis not present

## 2018-06-18 DIAGNOSIS — M25552 Pain in left hip: Secondary | ICD-10-CM | POA: Diagnosis not present

## 2018-06-19 DIAGNOSIS — M19211 Secondary osteoarthritis, right shoulder: Secondary | ICD-10-CM | POA: Diagnosis not present

## 2018-06-25 ENCOUNTER — Encounter (HOSPITAL_COMMUNITY): Payer: Medicare Other

## 2018-07-17 DIAGNOSIS — M19211 Secondary osteoarthritis, right shoulder: Secondary | ICD-10-CM | POA: Diagnosis not present

## 2018-07-24 DIAGNOSIS — M79671 Pain in right foot: Secondary | ICD-10-CM | POA: Diagnosis not present

## 2018-07-24 DIAGNOSIS — L84 Corns and callosities: Secondary | ICD-10-CM | POA: Diagnosis not present

## 2018-08-03 ENCOUNTER — Ambulatory Visit: Payer: Medicare Other | Admitting: Neurology

## 2018-09-04 ENCOUNTER — Other Ambulatory Visit: Payer: Self-pay | Admitting: Internal Medicine

## 2018-10-02 ENCOUNTER — Ambulatory Visit: Payer: Medicare Other | Admitting: Internal Medicine

## 2018-10-06 DIAGNOSIS — M25551 Pain in right hip: Secondary | ICD-10-CM | POA: Diagnosis not present

## 2018-10-06 DIAGNOSIS — M19071 Primary osteoarthritis, right ankle and foot: Secondary | ICD-10-CM | POA: Diagnosis not present

## 2018-10-06 DIAGNOSIS — M25561 Pain in right knee: Secondary | ICD-10-CM | POA: Diagnosis not present

## 2018-10-09 ENCOUNTER — Other Ambulatory Visit: Payer: Self-pay | Admitting: Cardiology

## 2018-10-12 ENCOUNTER — Telehealth: Payer: Self-pay | Admitting: Internal Medicine

## 2018-10-12 NOTE — Telephone Encounter (Signed)
I attempted to call pt in regards to his appointment request with CY. Patient did not answer at the time of call. LMTCB.

## 2018-10-13 NOTE — Telephone Encounter (Signed)
Called & spoke w/ pt. I apologized to pt and explained that CY will be out of office for the week of 10/19/2018-10/23/2018 and that currently his schedule is booked. Pt insisted on being seen, so I suggested we set up an appointment for 10/16/2018 at 12:00 PM, which pt agreed to. Pt is negative for COVID-19 screen. I verified our updated address and phone number with pt.   ROV appointment for 10/16/2018 at 12:00 PM has been scheduled. Routing to CY as an [place]. Nothing further needed at this time

## 2018-10-13 NOTE — Telephone Encounter (Signed)
Pt called back to speak with Annie Paras. He would like for her to call him this morning

## 2018-10-13 NOTE — Telephone Encounter (Signed)
Pt is very upset I offered to give him a appointment with a NP sooner and he wants to see the doctor.

## 2018-10-13 NOTE — Telephone Encounter (Signed)
Pt returning your call please call him back today

## 2018-10-16 ENCOUNTER — Encounter: Payer: Self-pay | Admitting: Internal Medicine

## 2018-10-16 ENCOUNTER — Other Ambulatory Visit: Payer: Self-pay

## 2018-10-16 ENCOUNTER — Ambulatory Visit (INDEPENDENT_AMBULATORY_CARE_PROVIDER_SITE_OTHER): Payer: Medicare Other | Admitting: Internal Medicine

## 2018-10-16 ENCOUNTER — Ambulatory Visit: Payer: Medicare Other | Admitting: Internal Medicine

## 2018-10-16 ENCOUNTER — Ambulatory Visit (INDEPENDENT_AMBULATORY_CARE_PROVIDER_SITE_OTHER): Payer: Medicare Other

## 2018-10-16 VITALS — BP 100/60 | HR 76 | Temp 97.4°F | Ht 74.0 in | Wt 164.2 lb

## 2018-10-16 DIAGNOSIS — J4530 Mild persistent asthma, uncomplicated: Secondary | ICD-10-CM | POA: Diagnosis not present

## 2018-10-16 DIAGNOSIS — J449 Chronic obstructive pulmonary disease, unspecified: Secondary | ICD-10-CM

## 2018-10-16 DIAGNOSIS — I480 Paroxysmal atrial fibrillation: Secondary | ICD-10-CM | POA: Diagnosis not present

## 2018-10-16 MED ORDER — UMECLIDINIUM BROMIDE 62.5 MCG/INH IN AEPB: 1.0000 | INHALATION_SPRAY | Freq: Every day | RESPIRATORY_TRACT | 12 refills | Status: DC

## 2018-10-16 NOTE — Progress Notes (Signed)
Subjective:    Patient ID: Levi [name], male    DOB: 07-10-1945, 73 y.o.   MRN: [id number] HPI  Male never smoker followed for allergy/asthma, rhinitis, history  ABPA, complicated by PAfib, GERD PFT- 01/04/2015-minimal restriction, minimal diffusion defect, insignificant response to bronchodilator. FVC 4.55/86%, FEV1 3.28/83%, FEV1/FVC 0.72, TLC 75%, DLCO 77 Office Spirometry 10/25/16-mild restriction of exhaled volume, minimal obstruction. FVC 4.04/78%, FEV1 2.71/71%, ratio 0.67, FEF 25-75% 1.68/58% Respiratory allergy profile 10/26/15- total IgE 60, minor elevations for dust mites and Aspergillus fumigatus ---------------------------------------------------------------------------------------------------.  04/03/2018-  73 year old male never smoker followed for allergy/asthma, rhinitis, history ABPA, complicated by PAfib, GERD -----4 month follow up for asthma. states his breathing has been ok since his last visit.  Srtiverdi Respimat,  prednisone 10 mg daily, albuterol HFA, neb DuoNeb, Has had flu vaccine.  He is very satisfied with his breathing now that the weather has cooled.  During the summer he needed to use nebulizer and rescue inhaler more.  Now he denies sleep disturbance and uses rescue inhaler only every day or 2.  10/16/2018-  73 year old male never smoker followed for allergy/asthma, rhinitis, history ABPA, complicated by PAfib, GERD -----pt states breathing has worsened since LOV, recently put on lasix by cardiologist 06/03/2018; denies cough, however spits up "ropey" material denies chest pain/tightness Arrival BP 90/50, Body weight 164 lbs Taking lasix prn for edema- feels controlled. More SOB past few months, using rescue twice daily and neb about every other day.  He just thinks it is time of year, denies acute event or infection. 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 Remembers Incruse as helpful. On maintenance prednisone, so dropping Qvar.  ROS-see HPI   + = positive  Constitutional:    weight loss, night sweats, fevers, chills, fatigue, lassitude. HEENT:    headaches, difficulty swallowing, tooth/dental problems, sore throat,       sneezing, itching, ear ache, nasal congestion, post nasal drip, snoring CV:    chest pain, orthopnea, PND, +swelling in lower extremities, anasarca,                                                   dizziness, palpitations Resp:   + shortness of breath with exertion or at rest.                productive cough,   non-productive cough, coughing up of blood.              change in color of mucus.  + wheezing.   Skin:    rash or lesions. GI:  No-   heartburn, indigestion, abdominal pain, nausea, vomiting, diarrhea,                 change in bowel habits, loss of appetite GU: dysuria, change in color of urine, no urgency or frequency.   flank pain. MS:   joint pain, stiffness, decreased range of motion, back pain. Neuro-     nothing unusual Psych:  change in mood or affect.  depression or anxiety.   memory loss.    Objective:  OBJ- Physical Exam General- Alert, Oriented, Affect-appropriate, Distress- none acute, tall/thin Skin- rash-none, lesions- none, excoriation- none Lymphadenopathy- none Head- atraumatic            Eyes- Gross vision intact, PERRLA, conjunctivae and secretions clear  Ears- Hearing, canals-normal            Nose- Clear, no-Septal dev, mucus, polyps, erosion, perforation             Throat- Mallampati II , mucosa clear , drainage- none, tonsils- atrophic Neck- flexible , trachea midline, no stridor , thyroid nl, carotid no bruit Chest - symmetrical excursion , unlabored           Heart/CV- RRR , no murmur , no gallop  , no rub, nl s1 s2                           - JVD- none , edema- none, stasis changes- none, varices+ Left calf           Lung- +distant/ few crackles wheeze- none, cough- none , dullness-none, rub- none           Chest wall-  Abd-  Br/ Gen/ Rectal- Not done, not indicated  Extrem- cyanosis- none, clubbing, none, atrophy- none, strength- nl Neuro- grossly intact to observation    Assessment & Plan:

## 2018-10-16 NOTE — Patient Instructions (Signed)
Script sent to add back Incruse 1 puff daily  Ok to try reducing/ stopping Qvar  Continue prednisone, Striverdi, your rescue inhaler and nebulizer machine  Order- CXR   Dx COPD mixed type  Please call as needed

## 2018-10-24 NOTE — Assessment & Plan Note (Signed)
Rhythm feels regular at this visit. He is not in overt heart failure.

## 2018-10-24 NOTE — Assessment & Plan Note (Signed)
Never smoker. Managing empirically with maint pred now, adding back Incruse to his LABA Striverdi. He did not benefit from Trelegy or Bevespi, but remembers Incruse as helpful. Plan- Incruse, CXR, d/c Qvar

## 2018-10-27 ENCOUNTER — Telehealth: Payer: Self-pay | Admitting: *Deleted

## 2018-10-27 NOTE — Progress Notes (Signed)
Telehealth Visit     Virtual Visit via Video Note   This visit type was conducted due to national recommendations for restrictions regarding the COVID-19 Pandemic (e.g. social distancing) in an effort to limit this patient's exposure and mitigate transmission in our community.  Due to his co-morbid illnesses, this patient is at least at moderate risk for complications without adequate follow up.  This format is felt to be most appropriate for this patient at this time.  All issues noted in this document were discussed and addressed.  A limited physical exam was performed with this format.  Please refer to the patient's chart for his consent to telehealth for [facility].   Evaluation Performed:  Follow-up visit  This visit type was conducted due to national recommendations for restrictions regarding the COVID-19 Pandemic (e.g. social distancing).  This format is felt to be most appropriate for this patient at this time.  All issues noted in this document were discussed and addressed.  No physical exam was performed (except for noted visual exam findings with Video Visits).  Please refer to the patient's chart (MyChart message for video visits and phone note for telephone visits) for the patient's consent to telehealth for [facility].  Date:  10/28/2018   ID:  Levi [name], DOB 05/26/1946, MRN [id number]  Patient Location:  Home  Provider location:   Home  PCP:  Orpah Melter, MD  Cardiologist:  Servando Snare & Candee Furbish, MD  Electrophysiologist:  None   Chief Complaint:  Follow up visit.   History of Present Illness:    Levi [name] is a 73 y.o. male who presents via audio/video conferencing for a telehealth visit today.  Seen for [name].   He has a history of PAFfirst notedin December of 2013then again in 2016. He is on low dose CCB therapy and low dose aspirin.Echocardiogram demonstrated EF in the 50% range. Prior catheterization in 2010 showed no flow-limiting  CAD.Other issues include COPD, spine surgery, occasional palpitation.He has had agent orange exposure.He had a prior lumbar fusion in 7673 complicated by PTX requiring chest tube placement. He has had CHADSVASC of 1 - has wanted to wait until over 76 to start anticoagulation if needed. I saw him back in December of 2019. He had had his echo updated - EF 40 to 45%. No beta blocker due to COPD. Intermittent swelling - felt to be due to CCB therapy. Using Lasix prn. Last seen by [name] in January of 2020. Cardiac status ok - had had a bout of ?vertigo and saw neurology with MRI.   The patient does not have symptoms concerning for COVID-19 infection (fever, chills, cough, or new shortness of breath).   Seen today via Doximity video. He has consented for this visit. He feels like he is doing well but has one concern - he has been taking more Lasix for lower extremity edema - especially over the last month. No chest pain. Breathing is probably a little worse as well.  He has been using compression stockings/elevating his legs with good results. He has not had to use any prn CCB - just taking his regular dose. Says he is not using salt. He has had a recent visit with pulmonary - BP was in the 41'P systolic - they were concerned about his use of Lasix - he is not on potassium. There was concern about a decrease in his lung function - he is having what sounds like PFTs later this week.  No  recent labs noted. He is not dizzy. No syncope. Has been having some shoulder issues and seeing ortho later this week as well. Rare palpitation noted.   Past Medical History:  Diagnosis Date  . Agent orange exposure 1970's   takes Imdur,Diltiazem, and Atacand daily  . ALLERGIC RHINITIS   . Asthma   . Bruises easily    d/t prednisone daily  . Chronic bronchitis ([place])    "used to get it q yr; last time ?2008" (04/28/2012)  . COPD (chronic obstructive pulmonary disease) (H[facility])    agent orange exposure  . Degenerative  disk disease    "qwhere" (04/28/2012)  . Diverticulitis   . Dysrhythmia    afib  . Elevated uric acid in blood    takes Allopurinol daily  . Enlarged prostate    but not on any meds  . GERD (gastroesophageal reflux disease)    takes Protonix daily  . H/O hiatal hernia   . Headache   . History of pneumonia    a. 2010  . Hyperlipidemia    takes Lipitor daily; pt. states he takes a preventive  . Joint pain   . Joint swelling   . PAF (paroxysmal atrial fibrillation) ([place])    a. Dx 04/2012, CHA2DS2VASc = 1 (age);  b. 04/2012 Echo: EF 40-50%, mild MR.  . Personal history of colonic adenomas 02/09/2013  . Shortness of breath dyspnea    Past Surgical History:  Procedure Laterality Date  . ANTERIOR CERVICAL DECOMP/DISCECTOMY FUSION N/A 07/21/2012   Procedure: ANTERIOR CERVICAL DECOMPRESSION/DISCECTOMY FUSION 2 LEVELS;  Surgeon: [name], MD;  Location: [facility];  Service: Neurosurgery;  Laterality: N/A;  C4-5 C5-6 Anterior cervical decompression/diskectomy/fusion  . ANTERIOR LAT LUMBAR FUSION N/A 04/07/2017   Procedure: Thoracic twelve-Lumbar one Anterolateral decompression/fusion;  Surgeon: [name], MD;  Location: [place];  Service: Neurosurgery;  Laterality: N/A;  . BACK SURGERY     x 3  . CARDIAC CATHETERIZATION  11/2008   [name] - 20% calcified non flow limiting left main, 50% EF apical hypokinesis  . CERVICAL DISC ARTHROPLASTY N/A 04/07/2017   Procedure: Cervical six-seven Disc arthroplasty;  Surgeon: [name], MD;  Location: [place];  Service: Neurosurgery;  Laterality: N/A;  . CHEST TUBE INSERTION Left 04/11/2017   Procedure: CHEST TUBE INSERTION;  Surgeon: [name], MD;  Location: [place];  Service: Thoracic;  Laterality: Left;  . CHOLECYSTECTOMY    . COLONOSCOPY    . ESOPHAGOGASTRODUODENOSCOPY    . EYE SURGERY Bilateral 2011   steroidal encapsulation" (04/28/2012)  . FOOT SURGERY Right    x 2  . KNEE ARTHROSCOPY Bilateral   . LATERAL / POSTERIOR  COMBINED FUSION LUMBAR SPINE  2012  . REVERSE SHOULDER ARTHROPLASTY  04/28/2012   Procedure: REVERSE SHOULDER ARTHROPLASTY;  Surgeon: [name], MD;  Location: [place];  Service: Orthopedics;  Laterality: Left;  Left shouder reverse total shoulder arthroplasty  . SHOULDER SURGERY    . [facility]  . TOTAL HIP ARTHROPLASTY Left 2011   "left" (04/28/2012)  . TOTAL HIP ARTHROPLASTY Right 07/31/2015   Procedure: TOTAL HIP ARTHROPLASTY ANTERIOR APPROACH;  Surgeon: [name], MD;  Location: [place];  Service: Orthopedics;  Laterality: Right;  . TOTAL KNEE ARTHROPLASTY  01/10/2012   Procedure: TOTAL KNEE ARTHROPLASTY;  Surgeon: [name], MD;  Location: [place];  Service: Orthopedics;;  left total knee arthroplasty  . TOTAL KNEE ARTHROPLASTY Right 06/17/2014   Procedure: TOTAL KNEE ARTHROPLASTY;  Surgeon: [name], MD;  Location: [place];  Service: Orthopedics;  Laterality: Right;     Current Meds  Medication Sig  . albuterol (PROVENTIL HFA;VENTOLIN HFA) 108 (90 Base) MCG/ACT inhaler TAKE 2 PUFFS BY MOUTH EVERY 4 HOURS AS NEEDED FOR WHEEZE  . allopurinol (ZYLOPRIM) 100 MG tablet Take 100 mg by mouth daily.    [name] aspirin EC 81 MG tablet Take 1 tablet (81 mg total) by mouth daily.  [name] atorvastatin (LIPITOR) 20 MG tablet TAKE 1 TABLET BY MOUTH EVERY DAY  . candesartan (ATACAND) 4 MG tablet TAKE 1/2 TABLETS (2 MG TOTAL) BY MOUTH DAILY.  [name] diltiazem (CARDIZEM CD) 180 MG 24 hr capsule TAKE 1 CAPSULE (180 MG TOTAL) BY MOUTH DAILY.  [name] diltiazem (CARDIZEM) 30 MG tablet Take 1 tablet every 4 hours AS NEEDED for afib HR >100 and BP >100  . furosemide (LASIX) 20 MG tablet Take 1 tablet (20 mg total) by mouth daily as needed. For shortness of breath and lower limb edema  . ipratropium-albuterol (DUONEB) 0.5-2.5 (3) MG/3ML SOLN 1 NEBULE EVERY 6 HOURS AS NEEDED. **J45.3**  . isosorbide mononitrate (IMDUR) 30 MG 24 hr tablet TAKE 1 TABLET (30 MG TOTAL) BY MOUTH DAILY.  .  Nebulizers (COMPRESSOR/NEBULIZER) MISC Use as directed  . Olodaterol HCl (STRIVERDI RESPIMAT) 2.5 MCG/ACT AERS INHALE 2 PUFFS INTO THE LUNGS DAILY.  . pantoprazole (PROTONIX) 40 MG tablet Take 1 tablet (40 mg total) by mouth 2 (two) times daily.  . predniSONE (DELTASONE) 10 MG tablet Take 1 tablet (10 mg total) by mouth daily. continuous  . tamsulosin (FLOMAX) 0.4 MG CAPS capsule Take 0.4 mg daily with supper by mouth.   . umeclidinium bromide (INCRUSE ELLIPTA) 62.5 MCG/INH AEPB Inhale 1 puff into the lungs daily.     Allergies:   Morphine and related; Levaquin [levofloxacin]; and Fluticasone-salmeterol   Social History   Tobacco Use  . Smoking status: Never Smoker  . Smokeless tobacco: Never Used  Substance Use Topics  . Alcohol use: No    Alcohol/week: 0.0 standard drinks  . Drug use: No     Family Hx: The patient's family history includes Diabetes in his mother; Heart disease in his father; Prostate cancer in his brother. There is no history of Colon cancer, Esophageal cancer, Rectal cancer, Stomach cancer, Pancreatic cancer, Kidney disease, or Liver disease.  ROS:   Please see the history of present illness.   All other systems reviewed are negative except for swelling.    Objective:    Vital Signs:  BP 112/74   Pulse 70   Ht 6\' 2"  (1.88 m)   Wt 168 lb (76.2 kg)   BMI 21.57 kg/m    Wt Readings from Last 3 Encounters:  10/28/18 168 lb (76.2 kg)  10/16/18 164 lb 3.2 oz (74.5 kg)  06/03/18 160 lb 1.9 oz (72.6 kg)    Alert male in no acute distress. His weight is up 8 pounds since January visit with [name]. He is not short of breath with our conversation.    Labs/Other Tests and Data Reviewed:    Lab Results  Component Value Date   WBC 6.5 04/12/2017   HGB 12.1 (L) 04/12/2017   HCT 36.1 (L) 04/12/2017   PLT 224 04/12/2017   GLUCOSE 114 (H) 04/29/2018   CHOL 193 04/29/2018   TRIG 84 04/29/2018   HDL 109 04/29/2018   LDLCALC 67 04/29/2018   ALT 29  04/29/2018   AST 32 04/29/2018  NA 141 04/29/2018   K 4.3 04/29/2018   CL 105 04/29/2018   CREATININE 1.02 04/29/2018   BUN 22 04/29/2018   CO2 21 04/29/2018   TSH 1.979 05/10/2012   INR 0.98 07/25/2015     BNP (last 3 results) No results for input(s): BNP in the last 8760 hours.  ProBNP (last 3 results) No results for input(s): PROBNP in the last 8760 hours.    Prior CV studies:    The following studies were reviewed today:  Echo Study Conclusions 10/2017  - Left ventricle: The cavity size was normal. Wall thickness was normal. Systolic function was mildly to moderately reduced. The estimated ejection fraction was in the range of 40% to 45%. Diffuse hypokinesis. Features are consistent with a pseudonormal left ventricular filling pattern, with concomitant abnormal relaxation and increased filling pressure (grade 2 diastolic dysfunction). - Right atrium: The atrium was mildly dilated.   ASSESSMENT & PLAN:    1. PAF - no documented recurrence since 2016 - has opted for aspirin until he turns 76. CHADSVASC is only 1. Then would consider anticoagulation. He is doing well from this standpoint, just a rare palpitation.   2. COPD - seeing pulmonary - having more testing later this week due to concern of worsening function   3. HLD - on statin - lipids from December noted.   4. Intermittent swelling - on CCB therapy - using lasix  - has been using more - needs labs and to wear compression stockings every day (he had only been using the days he has swelling).     5. Mild LV dysfunction - Not on ideal therapy but BP has been chronically soft - also with COPD that may be progressive - this has also limited our use of a beta blocker. We may need to get his Echo updated - it has been a year - due to current complaints. Will see what his lab show and the PFTs.   6. COVID-19 Education: The signs and symptoms of COVID-19 were discussed with the patient and how to  seek care for testing (follow up with PCP or arrange E-visit).  The importance of social distancing, staying at home, hand hygiene and wearing a mask when out in public were discussed today.  Patient Risk:   After full review of this patient's clinical status, I feel that they are at least moderate risk at this time.  Time:   Today, I have spent 12 minutes with the patient with telehealth technology discussing the above issues.     Medication Adjustments/Labs and Tests Ordered: Current medicines are reviewed at length with the patient today.  Concerns regarding medicines are outlined above.   Tests Ordered: No orders of the defined types were placed in this encounter.   Medication Changes: No orders of the defined types were placed in this encounter.   Disposition:  FU with me in about 3 weeks for further discussion and possible arrangement of further cardiac testing.   Patient is agreeable to this plan and will call if any problems develop in the interim.   Amie Critchley, NP  10/28/2018 11:43 AM    [place]

## 2018-10-27 NOTE — Telephone Encounter (Signed)
Virtual Visit Pre-Appointment Phone Call  "(Name), I am calling you today to discuss your upcoming appointment. We are currently trying to limit exposure to the virus that causes COVID-19 by seeing patients at home rather than in the office."  1. "What is the BEST phone number to call the day of the visit?" - include this in appointment notes  2. "Do you have or have access to (through a family member/friend) a smartphone with video capability that we can use for your visit?" a. If yes - list this number in appt notes as "cell" (if different from BEST phone #) and list the appointment type as a VIDEO visit in appointment notes b. If no - list the appointment type as a PHONE visit in appointment notes  3. Confirm consent - "In the setting of the current Covid19 crisis, you are scheduled for a (phone or video) visit with your provider on (Wednesday, June 3 ) at (11:30 am ).  Just as we do with many in-office visits, in order for you to participate in this visit, we must obtain consent.  If you'd like, I can send this to your mychart (if signed up) or email for you to review.  Otherwise, I can obtain your verbal consent now.  All virtual visits are billed to your insurance company just like a normal visit would be.  By agreeing to a virtual visit, we'd like you to understand that the technology does not allow for your provider to perform an examination, and thus may limit your provider's ability to fully assess your condition. If your provider identifies any concerns that need to be evaluated in person, we will make arrangements to do so.  Finally, though the technology is pretty good, we cannot assure that it will always work on either your or our end, and in the setting of a video visit, we may have to convert it to a phone-only visit.  In either situation, we cannot ensure that we have a secure connection.  Are you willing to proceed?" STAFF: Did the patient verbally acknowledge consent to telehealth  visit? Document YES/NO here: YES.  4. Advise patient to be prepared - "Two hours prior to your appointment, go ahead and check your blood pressure, pulse, oxygen saturation, and your weight (if you have the equipment to check those) and write them all down. When your visit starts, your provider will ask you for this information. If you have an Apple Watch or Kardia device, please plan to have heart rate information ready on the day of your appointment. Please have a pen and paper handy nearby the day of the visit as well."  5. Give patient instructions for MyChart download to smartphone OR Doximity/[email or web address].me as below if video visit (depending on what platform provider is using)  6. Inform patient they will receive a phone call 15 minutes prior to their appointment time (may be from unknown caller ID) so they should be prepared to answer    TELEPHONE CALL NOTE  Levi [name] has been deemed a candidate for a follow-up tele-health visit to limit community exposure during the Covid-19 pandemic. I spoke with the patient via phone to ensure availability of phone/video source, confirm preferred email & phone number, and discuss instructions and expectations.  I reminded Levi [name] to be prepared with any vital sign and/or heart rhythm information that could potentially be obtained via home monitoring, at the time of his visit. I reminded Levi [name] to expect  a phone call prior to his visit.  Taina Landry Avanell Shackleton 10/27/2018 8:38 AM   INSTRUCTIONS FOR DOWNLOADING THE MYCHART APP TO SMARTPHONE  - The patient must first make sure to have activated MyChart and know their login information - If Apple, go to App Store and type in MyChart in the search bar and download the app. If Android, ask patient to go to [employer] and type in [place] in the search bar and download the app. The app is free but as with any other app downloads, their phone may require them to verify saved payment information  or Apple/Android password.  - The patient will need to then log into the app with their MyChart username and password, and select [facility] as their healthcare provider to link the account. When it is time for your visit, go to the MyChart app, find appointments, and click Begin Video Visit. Be sure to Select Allow for your device to access the Microphone and Camera for your visit. You will then be connected, and your provider will be with you shortly.  **If they have any issues connecting, or need assistance please contact MyChart service desk [phone])**  **If using a computer, in order to ensure the best quality for their visit they will need to use either of the following Internet Browsers: [employer], or Google Chrome**  IF USING DOXIMITY or DOXY.ME - The patient will receive a link just prior to their visit by text.     FULL LENGTH CONSENT FOR TELE-HEALTH VISIT   I hereby voluntarily request, consent and authorize [facility] and its employed or contracted physicians, physician assistants, nurse practitioners or other licensed health care professionals (the Practitioner), to provide me with telemedicine health care services (the "Services") as deemed necessary by the treating Practitioner. I acknowledge and consent to receive the Services by the Practitioner via telemedicine. I understand that the telemedicine visit will involve communicating with the Practitioner through live audiovisual communication technology and the disclosure of certain medical information by electronic transmission. I acknowledge that I have been given the opportunity to request an in-person assessment or other available alternative prior to the telemedicine visit and am voluntarily participating in the telemedicine visit.  I understand that I have the right to withhold or withdraw my consent to the use of telemedicine in the course of my care at any time, without affecting my right to future  care or treatment, and that the Practitioner or I may terminate the telemedicine visit at any time. I understand that I have the right to inspect all information obtained and/or recorded in the course of the telemedicine visit and may receive copies of available information for a reasonable fee.  I understand that some of the potential risks of receiving the Services via telemedicine include:  [name] Delay or interruption in medical evaluation due to technological equipment failure or disruption; . Information transmitted may not be sufficient (e.g. poor resolution of images) to allow for appropriate medical decision making by the Practitioner; and/or  . In rare instances, security protocols could fail, causing a breach of personal health information.  Furthermore, I acknowledge that it is my responsibility to provide information about my medical history, conditions and care that is complete and accurate to the best of my ability. I acknowledge that Practitioner's advice, recommendations, and/or decision may be based on factors not within their control, such as incomplete or inaccurate data provided by me or distortions of diagnostic images or specimens that may  result from electronic transmissions. I understand that the practice of medicine is not an exact science and that Practitioner makes no warranties or guarantees regarding treatment outcomes. I acknowledge that I will receive a copy of this consent concurrently upon execution via email to the email address I last provided but may also request a printed copy by calling the office of [facility].    I understand that my insurance will be billed for this visit.   I have read or had this consent read to me. . I understand the contents of this consent, which adequately explains the benefits and risks of the Services being provided via telemedicine.  . I have been provided ample opportunity to ask questions regarding this consent and the Services and have  had my questions answered to my satisfaction. . I give my informed consent for the services to be provided through the use of telemedicine in my medical care  By participating in this telemedicine visit I agree to the above.

## 2018-10-28 ENCOUNTER — Telehealth (INDEPENDENT_AMBULATORY_CARE_PROVIDER_SITE_OTHER): Payer: Medicare Other | Admitting: Nurse Practitioner

## 2018-10-28 ENCOUNTER — Other Ambulatory Visit: Payer: Self-pay

## 2018-10-28 ENCOUNTER — Telehealth: Payer: Self-pay | Admitting: *Deleted

## 2018-10-28 ENCOUNTER — Encounter: Payer: Self-pay | Admitting: Nurse Practitioner

## 2018-10-28 VITALS — BP 112/74 | HR 70 | Ht 74.0 in | Wt 168.0 lb

## 2018-10-28 DIAGNOSIS — I480 Paroxysmal atrial fibrillation: Secondary | ICD-10-CM

## 2018-10-28 DIAGNOSIS — J449 Chronic obstructive pulmonary disease, unspecified: Secondary | ICD-10-CM

## 2018-10-28 DIAGNOSIS — I5022 Chronic systolic (congestive) heart failure: Secondary | ICD-10-CM

## 2018-10-28 DIAGNOSIS — R609 Edema, unspecified: Secondary | ICD-10-CM

## 2018-10-28 DIAGNOSIS — Z7189 Other specified counseling: Secondary | ICD-10-CM

## 2018-10-28 DIAGNOSIS — R0602 Shortness of breath: Secondary | ICD-10-CM

## 2018-10-28 DIAGNOSIS — E7800 Pure hypercholesterolemia, unspecified: Secondary | ICD-10-CM

## 2018-10-28 NOTE — Telephone Encounter (Signed)
° ° °  COVID-19 Pre-Screening Questions: ° °• In the past 7 to 10 days have you had a cough,  shortness of breath, headache, congestion, fever (100 or greater) body aches, chills, sore throat, or sudden loss of taste or sense of smell? °• Have you been around anyone with known Covid 19. °• Have you been around anyone who is awaiting Covid 19 test results in the past 7 to 10 days? °• Have you been around anyone who has been exposed to Covid 19, or has mentioned symptoms of Covid 19 within the past 7 to 10 days? ° °If you have any concerns/questions about symptoms patients report during screening (either on the phone or at threshold). Contact the provider seeing the patient or DOD for further guidance.  If neither are available contact a member of the leadership team. ° °

## 2018-10-28 NOTE — Patient Instructions (Addendum)
After Visit Summary:  We will be checking the following labs today - NONE  Lab on Friday at [facility] [place], CBC and BNP   Medication Instructions:    Continue with your current medicines.    If you need a refill on your cardiac medications before your next appointment, please call your pharmacy.     Testing/Procedures To Be Arranged:  N/A  Follow-Up:   See me in a few weeks     At [facility], you and your health needs are our priority.  As part of our continuing mission to provide you with exceptional heart care, we have created designated Provider Care Teams.  These Care Teams include your primary Cardiologist (physician) and Advanced Practice Providers (APPs -  Physician Assistants and Nurse Practitioners) who all work together to provide you with the care you need, when you need it.  Special Instructions:  . Stay safe, stay home, wash your hands for at least 20 seconds and wear a mask when out in public.  . It was good to talk with you today.  . Try wearing the support stockings every day . Keep elevating your legs . We will see what the breathing test and your labs show - we may need to do further heart testing. We will talk about this further when I see you next time.    Call the [place] office at [phone] if you have any questions, problems or concerns.

## 2018-10-29 ENCOUNTER — Telehealth: Payer: Self-pay | Admitting: *Deleted

## 2018-10-29 NOTE — Telephone Encounter (Signed)
    COVID-19 Pre-Screening Questions:  . In the past 7 to 10 days have you had a cough,  shortness of breath, headache, congestion, fever (100 or greater) body aches, chills, sore throat, or sudden loss of taste or sense of smell? . Have you been around anyone with known Covid 19. . Have you been around anyone who is awaiting Covid 19 test results in the past 7 to 10 days? . Have you been around anyone who has been exposed to Covid 19, or has mentioned symptoms of Covid 19 within the past 7 to 10 days?  If you have any concerns/questions about symptoms patients report during screening (either on the phone or at threshold). Contact the provider seeing the patient or DOD for further guidance.  If neither are available contact a member of the leadership team.           Contacted patient via telephone call. No to all Covid 19 questions. Has a mask.  KB

## 2018-10-30 ENCOUNTER — Other Ambulatory Visit: Payer: Medicare Other | Admitting: *Deleted

## 2018-10-30 ENCOUNTER — Other Ambulatory Visit: Payer: Self-pay

## 2018-10-30 ENCOUNTER — Ambulatory Visit: Payer: Medicare Other | Admitting: Internal Medicine

## 2018-10-30 DIAGNOSIS — R609 Edema, unspecified: Secondary | ICD-10-CM | POA: Diagnosis not present

## 2018-10-30 DIAGNOSIS — I5022 Chronic systolic (congestive) heart failure: Secondary | ICD-10-CM | POA: Diagnosis not present

## 2018-10-30 DIAGNOSIS — M67911 Unspecified disorder of synovium and tendon, right shoulder: Secondary | ICD-10-CM | POA: Diagnosis not present

## 2018-10-30 LAB — BASIC METABOLIC PANEL
BUN/Creatinine Ratio: 19 (ref 10–24)
BUN: 23 mg/dL (ref 8–27)
CO2: 25 mmol/L (ref 20–29)
Calcium: 10.2 mg/dL (ref 8.6–10.2)
Chloride: 106 mmol/L (ref 96–106)
Creatinine, Ser: 1.18 mg/dL (ref 0.76–1.27)
GFR calc Af Amer: 71 mL/min/{1.73_m2} (ref >59)
GFR calc non Af Amer: 62 mL/min/{1.73_m2} (ref >59)
Glucose: 108 mg/dL — ABNORMAL HIGH (ref 65–99)
Potassium: 3.8 mmol/L (ref 3.5–5.2)
Sodium: 146 mmol/L — ABNORMAL HIGH (ref 134–144)

## 2018-10-30 LAB — CBC
Hematocrit: 43.4 % (ref 37.5–51.0)
Hemoglobin: 14.6 g/dL (ref 13.0–17.7)
MCH: 32.7 pg (ref 26.6–33.0)
MCHC: 33.6 g/dL (ref 31.5–35.7)
MCV: 97 fL (ref 79–97)
Platelets: 170 10*3/uL (ref 150–450)
RBC: 4.47 x10E6/uL (ref 4.14–5.80)
RDW: 13.2 % (ref 11.6–15.4)
WBC: 9.2 10*3/uL (ref 3.4–10.8)

## 2018-10-30 LAB — PRO B NATRIURETIC PEPTIDE: NT-Pro BNP: 33 pg/mL (ref 0–376)

## 2018-11-02 ENCOUNTER — Other Ambulatory Visit: Payer: Self-pay | Admitting: Cardiology

## 2018-11-03 ENCOUNTER — Ambulatory Visit: Payer: Medicare Other | Admitting: Nurse Practitioner

## 2018-11-10 DIAGNOSIS — M779 Enthesopathy, unspecified: Secondary | ICD-10-CM | POA: Diagnosis not present

## 2018-11-10 DIAGNOSIS — M19079 Primary osteoarthritis, unspecified ankle and foot: Secondary | ICD-10-CM | POA: Diagnosis not present

## 2018-11-10 DIAGNOSIS — B351 Tinea unguium: Secondary | ICD-10-CM | POA: Diagnosis not present

## 2018-11-15 ENCOUNTER — Other Ambulatory Visit: Payer: Self-pay | Admitting: Cardiology

## 2018-11-17 ENCOUNTER — Other Ambulatory Visit: Payer: Self-pay | Admitting: Cardiology

## 2018-11-17 ENCOUNTER — Telehealth: Payer: Self-pay | Admitting: Internal Medicine

## 2018-11-17 MED ORDER — FUROSEMIDE 20 MG PO TABS: 20.0000 mg | ORAL_TABLET | Freq: Every day | ORAL | 3 refills | Status: DC | PRN

## 2018-11-17 MED ORDER — BECLOMETHASONE DIPROPIONATE 80 MCG/ACT IN AERS: 2.0000 | INHALATION_SPRAY | Freq: Two times a day (BID) | RESPIRATORY_TRACT | 12 refills | Status: DC

## 2018-11-17 NOTE — Telephone Encounter (Signed)
Yes- ok to DC Incruse and Rx Qvar 80, # 1,    Inhale 2 puffs then rinse mouth, twice daily    Ref x 12

## 2018-11-17 NOTE — Telephone Encounter (Signed)
Called & spoke w/ pt regarding CY's recommendations below. Pt verbalized understanding. I verified pt's preferred pharmacy as [facility] in [place]. Order for Qvar 80, # 1, Inhale 2 puffs then rinse mouth, twice daily    Ref x 12 has been placed per CY. Nothing further needed at this time.

## 2018-11-17 NOTE — Telephone Encounter (Signed)
Spoke with patient. He stated that he was seen by CY on 5/22 and was switched from Qvar 52mcg to Incruse. He stated that he has tried to use the Incruse but he is really struggling with it. He stated that the powder seems to be too thick for him to breathe in. He wants to know if he could switch back to the Qvar.   If he is allowed to switch back to Qvar, he will need to [facility].   CY, please advise if you are ok with him switching back to Qvar. Thanks!

## 2018-11-18 ENCOUNTER — Other Ambulatory Visit: Payer: Self-pay | Admitting: Internal Medicine

## 2018-12-05 DIAGNOSIS — M25562 Pain in left knee: Secondary | ICD-10-CM | POA: Diagnosis not present

## 2018-12-15 ENCOUNTER — Telehealth: Payer: Self-pay | Admitting: Nurse Practitioner

## 2018-12-15 NOTE — Telephone Encounter (Signed)

## 2018-12-15 NOTE — Progress Notes (Signed)
CARDIOLOGY OFFICE NOTE  Date:  12/16/2018    Levi [name] Date of Birth: 19-Dec-1945 Medical Record #[id number]  PCP:  Orpah Melter, MD  Cardiologist:  Marisa Cyphers    Chief Complaint  Patient presents with   Follow-up    Seen for [name]    History of Present Illness: Levi [name] is a 73 y.o. male who presents today for a follow up visit.  Seen for [name].   He has a history of PAFfirst notedin December of 2013then again in 2016. He is on low dose CCB therapy and low dose aspirin.Echocardiogram demonstrated EF in the 50% range. Prior catheterization in 2010 showed no flow-limiting CAD.Other issues include COPD, spine surgery, occasional palpitation.He has had agent orange exposure.He had a prior lumbar fusion in 8588 complicated by PTX requiring chest tube placement. He has had CHADSVASC of 1 - has wanted to wait until over 76 to start anticoagulation if needed. I saw him back in December of 2019. He had had his echo updated - EF 40 to 45%. No beta blocker due to COPD. Intermittent swelling - felt to be due to CCB therapy. Using Lasix prn. Last seen by [name] in January of 2020. Cardiac status ok - had had a bout of ?vertigo and saw neurology with MRI.   I last saw him last month thru a telehealth visit. - he was using more Lasix for lower extremity edema. Had seen pulmonary who was concerned about his diuretic use and low BP. He was getting ready to have PFTs. CXR was done. Was not dizzy. Breathing probably a little worse. We sent him for labs here - BNP totally normal.   The patient does not have symptoms concerning for COVID-19 infection (fever, chills, cough, or new shortness of breath).   Comes in today. Here alone. He is feeling better. His breathing is better. He says he was told that his breathing test - while abnormal - was stable for him. His swelling has improved. He is using less Lasix. He continues to exercise daily. He has had a  fractured knee cap - not really impacting his ability to exercise. Seeing Dr. Berenice Primas later this week. No chest pain. Very little AF per his report.   Past Medical History:  Diagnosis Date   Agent orange exposure 1970's   takes Imdur,Diltiazem, and Atacand daily   ALLERGIC RHINITIS    Asthma    Bruises easily    d/t prednisone daily   Chronic bronchitis (HCC)    "used to get it q yr; last time ?2008" (04/28/2012)   COPD (chronic obstructive pulmonary disease) (HCC)    agent orange exposure   Degenerative disk disease    "qwhere" (04/28/2012)   Diverticulitis    Dysrhythmia    afib   Elevated uric acid in blood    takes Allopurinol daily   Enlarged prostate    but not on any meds   GERD (gastroesophageal reflux disease)    takes Protonix daily   H/O hiatal hernia    Headache    History of pneumonia    a. 2010   Hyperlipidemia    takes Lipitor daily; pt. states he takes a preventive   Joint pain    Joint swelling    PAF (paroxysmal atrial fibrillation) ([place])    a. Dx 04/2012, CHA2DS2VASc = 1 (age);  b. 04/2012 Echo: EF 40-50%, mild MR.   Personal history of colonic adenomas 02/09/2013   Shortness  of breath dyspnea     Past Surgical History:  Procedure Laterality Date   ANTERIOR CERVICAL DECOMP/DISCECTOMY FUSION N/A 07/21/2012   Procedure: ANTERIOR CERVICAL DECOMPRESSION/DISCECTOMY FUSION 2 LEVELS;  Surgeon: [name], MD;  Location: [facility];  Service: Neurosurgery;  Laterality: N/A;  C4-5 C5-6 Anterior cervical decompression/diskectomy/fusion   ANTERIOR LAT LUMBAR FUSION N/A 04/07/2017   Procedure: Thoracic twelve-Lumbar one Anterolateral decompression/fusion;  Surgeon: [name], MD;  Location: [facility];  Service: Neurosurgery;  Laterality: N/A;   BACK SURGERY     x 3   CARDIAC CATHETERIZATION  11/2008   [name] - 20% calcified non flow limiting left main, 50% EF apical hypokinesis   CERVICAL DISC ARTHROPLASTY N/A 04/07/2017    Procedure: Cervical six-seven Disc arthroplasty;  Surgeon: [name], MD;  Location: [facility];  Service: Neurosurgery;  Laterality: N/A;   CHEST TUBE INSERTION Left 04/11/2017   Procedure: CHEST TUBE INSERTION;  Surgeon: [name], MD;  Location: [facility];  Service: Thoracic;  Laterality: Left;   CHOLECYSTECTOMY     COLONOSCOPY     ESOPHAGOGASTRODUODENOSCOPY     EYE SURGERY Bilateral 2011   steroidal encapsulation" (04/28/2012)   FOOT SURGERY Right    x 2   KNEE ARTHROSCOPY Bilateral    LATERAL / POSTERIOR COMBINED FUSION LUMBAR SPINE  2012   REVERSE SHOULDER ARTHROPLASTY  04/28/2012   Procedure: REVERSE SHOULDER ARTHROPLASTY;  Surgeon: [name], MD;  Location: [facility];  Service: Orthopedics;  Laterality: Left;  Left shouder reverse total shoulder arthroplasty   [place] ARTHROPLASTY Left 2011   "left" (04/28/2012)   TOTAL HIP ARTHROPLASTY Right 07/31/2015   Procedure: TOTAL HIP ARTHROPLASTY ANTERIOR APPROACH;  Surgeon: [name], MD;  Location: [facility];  Service: Orthopedics;  Laterality: Right;   TOTAL KNEE ARTHROPLASTY  01/10/2012   Procedure: TOTAL KNEE ARTHROPLASTY;  Surgeon: [name], MD;  Location: [facility];  Service: Orthopedics;;  left total knee arthroplasty   TOTAL KNEE ARTHROPLASTY Right 06/17/2014   Procedure: TOTAL KNEE ARTHROPLASTY;  Surgeon: [name], MD;  Location: [facility];  Service: Orthopedics;  Laterality: Right;     Medications: Current Meds  Medication Sig   albuterol (PROVENTIL HFA;VENTOLIN HFA) 108 (90 Base) MCG/ACT inhaler TAKE 2 PUFFS BY MOUTH EVERY 4 HOURS AS NEEDED FOR WHEEZE   allopurinol (ZYLOPRIM) 100 MG tablet Take 100 mg by mouth daily.     aspirin EC 81 MG tablet Take 1 tablet (81 mg total) by mouth daily.   atorvastatin (LIPITOR) 20 MG tablet TAKE 1 TABLET BY MOUTH EVERY DAY   beclomethasone (QVAR) 80 MCG/ACT inhaler Inhale 2 puffs into the lungs 2  (two) times daily.   candesartan (ATACAND) 4 MG tablet TAKE 1/2 TABLETS (2 MG TOTAL) BY MOUTH DAILY.   diltiazem (CARDIZEM CD) 180 MG 24 hr capsule TAKE 1 CAPSULE BY MOUTH EVERY DAY   diltiazem (CARDIZEM) 30 MG tablet Take 1 tablet every 4 hours AS NEEDED for afib HR >100 and BP >100   furosemide (LASIX) 20 MG tablet Take 1 tablet (20 mg total) by mouth daily as needed. For shortness of breath and lower limb edema   ipratropium-albuterol (DUONEB) 0.5-2.5 (3) MG/3ML SOLN 1 NEBULE EVERY 6 HOURS AS NEEDED. **J45.3**   isosorbide mononitrate (IMDUR) 30 MG 24 hr tablet TAKE 1 TABLET (30 MG TOTAL) BY MOUTH DAILY.   Nebulizers (COMPRESSOR/NEBULIZER) MISC Use as directed  Olodaterol HCl (STRIVERDI RESPIMAT) 2.5 MCG/ACT AERS INHALE 2 PUFFS INTO THE LUNGS DAILY.   pantoprazole (PROTONIX) 40 MG tablet Take 1 tablet (40 mg total) by mouth 2 (two) times daily.   predniSONE (DELTASONE) 10 MG tablet TAKE 1 TABLET (10 MG TOTAL) BY MOUTH DAILY. CONTINUOUS   tamsulosin (FLOMAX) 0.4 MG CAPS capsule Take 0.4 mg daily with supper by mouth.      Allergies: Allergies  Allergen Reactions   Morphine And Related Palpitations and Other (See Comments)    Light headed and sweaty; "my heart races" (04/28/2012), pt states too much morphine gave him this reaction   Levaquin [Levofloxacin] Itching   Fluticasone-Salmeterol     Social History: The patient  reports that he has never smoked. He has never used smokeless tobacco. He reports that he does not drink alcohol or use drugs.   Family History: The patient's family history includes Diabetes in his mother; Heart disease in his father; Prostate cancer in his brother.   Review of Systems: Please see the history of present illness.   All other systems are reviewed and negative.   Physical Exam: VS:  BP 108/64 (BP Location: Left Arm, Patient Position: Sitting, Cuff Size: Normal)    Pulse 65    Ht 6\' 2"  (1.88 m)    Wt 161 lb 12.8 oz (73.4 kg)    BMI 20.77  kg/m  .  BMI Body mass index is 20.77 kg/m.  Wt Readings from Last 3 Encounters:  12/16/18 161 lb 12.8 oz (73.4 kg)  10/28/18 168 lb (76.2 kg)  10/16/18 164 lb 3.2 oz (74.5 kg)    General: Pleasant. Alert and in no acute distress.  His weight is down. Thin.  HEENT: Normal.  Neck: Supple, no JVD, carotid bruits, or masses noted. Voice chronically hoarse. Cardiac: Regular rate and rhythm. No murmurs, rubs, or gallops. No edema.  Respiratory:  Decreased breath sounds but with normal work of breathing.  GI: Soft and nontender.  MS: No deformity or atrophy. Gait and ROM intact.  Skin: Warm and dry. Color is normal.  Neuro:  Strength and sensation are intact and no gross focal deficits noted.  Psych: Alert, appropriate and with normal affect.   LABORATORY DATA:  EKG:  EKG is ordered today. This demonstrates NSR - HR of 65.  Lab Results  Component Value Date   WBC 9.2 10/30/2018   HGB 14.6 10/30/2018   HCT 43.4 10/30/2018   PLT 170 10/30/2018   GLUCOSE 108 (H) 10/30/2018   CHOL 193 04/29/2018   TRIG 84 04/29/2018   HDL 109 04/29/2018   LDLCALC 67 04/29/2018   ALT 29 04/29/2018   AST 32 04/29/2018   NA 146 (H) 10/30/2018   K 3.8 10/30/2018   CL 106 10/30/2018   CREATININE 1.18 10/30/2018   BUN 23 10/30/2018   CO2 25 10/30/2018   TSH 1.979 05/10/2012   INR 0.98 07/25/2015     BNP (last 3 results) No results for input(s): BNP in the last 8760 hours.  ProBNP (last 3 results) Recent Labs    10/30/18 0945  PROBNP 33     Other Studies Reviewed Today:  CXR 09/2018 IMPRESSION: 1. No acute cardiopulmonary disease. 2. Stable hyperexpansion. 3. Postsurgical changes  EchoStudy Conclusions6/2019  - Left ventricle: The cavity size was normal. Wall thickness was normal. Systolic function was mildly to moderately reduced. The estimated ejection fraction was in the range of 40% to 45%. Diffuse hypokinesis. Features are consistent with a pseudonormal  left  ventricular filling pattern, with concomitant abnormal relaxation and increased filling pressure (grade 2 diastolic dysfunction). - Right atrium: The atrium was mildly dilated.   ASSESSMENT & PLAN:    1. PAF -no documented recurrence since 2016 -has opted for aspirin until he turns 76. CHADSVASC is only 1.  Remains in NSR by EKG and exam.   2. COPD - followed by pulmonary - back on QVAR - recent CXR noted - breathing has improved.   3. HLD - on statin - lipids from December noted.   4. Intermittent swelling - on CCB therapy - now with less use of his Lasix. Swelling has improved. He already restricts his salt pretty well.   5. Mild LV dysfunction - Not on ideal therapy but BP has been chronically soft - also with COPD that may be progressive - and this also limits our use of a beta blocker. His breathing and swelling have improved since our telehealth visit from last month. He is using less Lasix. BP remains soft - we have opted to follow for now.   6. COVID-19 Education: The signs and symptoms of COVID-19 were discussed with the patient and how to seek care for testing (follow up with PCP or arrange E-visit).  The importance of social distancing, staying at home, hand hygiene and wearing a mask when out in public were discussed today.  Current medicines are reviewed with the patient today.  The patient does not have concerns regarding medicines other than what has been noted above.  The following changes have been made:  See above.  Labs/ tests ordered today include:    Orders Placed This Encounter  Procedures   EKG 12-Lead     Disposition:   FU with me as planned in November.  Patient is agreeable to this plan and will call if any problems develop in the interim.   SignedTruitt Merle, NP  12/16/2018 8:21 AM  [facility] [address] [place], [place]  [address] Phone: [phone] Fax: [phone]

## 2018-12-16 ENCOUNTER — Other Ambulatory Visit: Payer: Self-pay

## 2018-12-16 ENCOUNTER — Encounter: Payer: Self-pay | Admitting: Nurse Practitioner

## 2018-12-16 ENCOUNTER — Ambulatory Visit (INDEPENDENT_AMBULATORY_CARE_PROVIDER_SITE_OTHER): Payer: Medicare Other | Admitting: Nurse Practitioner

## 2018-12-16 VITALS — BP 108/64 | HR 65 | Ht 74.0 in | Wt 161.8 lb

## 2018-12-16 DIAGNOSIS — R0602 Shortness of breath: Secondary | ICD-10-CM | POA: Diagnosis not present

## 2018-12-16 DIAGNOSIS — R609 Edema, unspecified: Secondary | ICD-10-CM | POA: Diagnosis not present

## 2018-12-16 DIAGNOSIS — E7800 Pure hypercholesterolemia, unspecified: Secondary | ICD-10-CM

## 2018-12-16 DIAGNOSIS — I480 Paroxysmal atrial fibrillation: Secondary | ICD-10-CM | POA: Diagnosis not present

## 2018-12-16 DIAGNOSIS — I5022 Chronic systolic (congestive) heart failure: Secondary | ICD-10-CM | POA: Diagnosis not present

## 2018-12-16 DIAGNOSIS — J4489 Other specified chronic obstructive pulmonary disease: Secondary | ICD-10-CM

## 2018-12-16 DIAGNOSIS — J449 Chronic obstructive pulmonary disease, unspecified: Secondary | ICD-10-CM | POA: Diagnosis not present

## 2018-12-16 NOTE — Patient Instructions (Addendum)
After Visit Summary:  We will be checking the following labs today - NONE   Medication Instructions:    Continue with your current medicines.    If you need a refill on your cardiac medications before your next appointment, please call your pharmacy.     Testing/Procedures To Be Arranged:  N/A  Follow-Up:   See me as planned in November    At [facility], you and your health needs are our priority.  As part of our continuing mission to provide you with exceptional heart care, we have created designated Provider Care Teams.  These Care Teams include your primary Cardiologist (physician) and Advanced Practice Providers (APPs -  Physician Assistants and Nurse Practitioners) who all work together to provide you with the care you need, when you need it.  Special Instructions:  . Stay safe, stay home, wash your hands for at least 20 seconds and wear a mask when out in public.   It was good to talk with you today.   Keep up the good work.   Call the [place] office at [phone] if you have any questions, problems or concerns.

## 2018-12-17 DIAGNOSIS — M25562 Pain in left knee: Secondary | ICD-10-CM | POA: Diagnosis not present

## 2019-01-07 DIAGNOSIS — M19071 Primary osteoarthritis, right ankle and foot: Secondary | ICD-10-CM | POA: Diagnosis not present

## 2019-01-07 DIAGNOSIS — M79671 Pain in right foot: Secondary | ICD-10-CM | POA: Diagnosis not present

## 2019-01-07 DIAGNOSIS — M25562 Pain in left knee: Secondary | ICD-10-CM | POA: Diagnosis not present

## 2019-01-11 DIAGNOSIS — B351 Tinea unguium: Secondary | ICD-10-CM | POA: Diagnosis not present

## 2019-01-11 DIAGNOSIS — M216X2 Other acquired deformities of left foot: Secondary | ICD-10-CM | POA: Diagnosis not present

## 2019-01-11 DIAGNOSIS — M19072 Primary osteoarthritis, left ankle and foot: Secondary | ICD-10-CM | POA: Diagnosis not present

## 2019-01-11 DIAGNOSIS — M779 Enthesopathy, unspecified: Secondary | ICD-10-CM | POA: Diagnosis not present

## 2019-01-11 DIAGNOSIS — M19079 Primary osteoarthritis, unspecified ankle and foot: Secondary | ICD-10-CM | POA: Diagnosis not present

## 2019-01-11 DIAGNOSIS — M216X1 Other acquired deformities of right foot: Secondary | ICD-10-CM | POA: Diagnosis not present

## 2019-01-11 DIAGNOSIS — M19071 Primary osteoarthritis, right ankle and foot: Secondary | ICD-10-CM | POA: Diagnosis not present

## 2019-01-15 ENCOUNTER — Ambulatory Visit: Payer: Medicare Other | Admitting: Internal Medicine

## 2019-01-19 DIAGNOSIS — H53001 Unspecified amblyopia, right eye: Secondary | ICD-10-CM | POA: Diagnosis not present

## 2019-01-19 DIAGNOSIS — H02834 Dermatochalasis of left upper eyelid: Secondary | ICD-10-CM | POA: Diagnosis not present

## 2019-01-19 DIAGNOSIS — H0100B Unspecified blepharitis left eye, upper and lower eyelids: Secondary | ICD-10-CM | POA: Diagnosis not present

## 2019-01-19 DIAGNOSIS — H527 Unspecified disorder of refraction: Secondary | ICD-10-CM | POA: Diagnosis not present

## 2019-01-19 DIAGNOSIS — H0100A Unspecified blepharitis right eye, upper and lower eyelids: Secondary | ICD-10-CM | POA: Diagnosis not present

## 2019-01-19 DIAGNOSIS — H43813 Vitreous degeneration, bilateral: Secondary | ICD-10-CM | POA: Diagnosis not present

## 2019-01-19 DIAGNOSIS — H35432 Paving stone degeneration of retina, left eye: Secondary | ICD-10-CM | POA: Diagnosis not present

## 2019-01-19 DIAGNOSIS — H31091 Other chorioretinal scars, right eye: Secondary | ICD-10-CM | POA: Diagnosis not present

## 2019-01-19 DIAGNOSIS — Z961 Presence of intraocular lens: Secondary | ICD-10-CM | POA: Diagnosis not present

## 2019-01-19 DIAGNOSIS — H02831 Dermatochalasis of right upper eyelid: Secondary | ICD-10-CM | POA: Diagnosis not present

## 2019-02-04 DIAGNOSIS — M19071 Primary osteoarthritis, right ankle and foot: Secondary | ICD-10-CM | POA: Diagnosis not present

## 2019-02-04 DIAGNOSIS — M19072 Primary osteoarthritis, left ankle and foot: Secondary | ICD-10-CM | POA: Diagnosis not present

## 2019-02-04 DIAGNOSIS — M25562 Pain in left knee: Secondary | ICD-10-CM | POA: Diagnosis not present

## 2019-02-10 DIAGNOSIS — K219 Gastro-esophageal reflux disease without esophagitis: Secondary | ICD-10-CM | POA: Diagnosis not present

## 2019-02-14 ENCOUNTER — Other Ambulatory Visit: Payer: Self-pay | Admitting: Cardiology

## 2019-02-17 DIAGNOSIS — B352 Tinea manuum: Secondary | ICD-10-CM | POA: Diagnosis not present

## 2019-02-17 DIAGNOSIS — Z23 Encounter for immunization: Secondary | ICD-10-CM | POA: Diagnosis not present

## 2019-02-17 DIAGNOSIS — L820 Inflamed seborrheic keratosis: Secondary | ICD-10-CM | POA: Diagnosis not present

## 2019-02-17 DIAGNOSIS — L570 Actinic keratosis: Secondary | ICD-10-CM | POA: Diagnosis not present

## 2019-02-24 ENCOUNTER — Encounter: Payer: Self-pay | Admitting: Physician Assistant

## 2019-02-24 ENCOUNTER — Ambulatory Visit (INDEPENDENT_AMBULATORY_CARE_PROVIDER_SITE_OTHER): Payer: Medicare Other | Admitting: Physician Assistant

## 2019-02-24 ENCOUNTER — Other Ambulatory Visit: Payer: Self-pay

## 2019-02-24 VITALS — BP 100/60 | HR 81 | Temp 98.4°F | Ht 74.0 in | Wt 163.0 lb

## 2019-02-24 DIAGNOSIS — K228 Other specified diseases of esophagus: Secondary | ICD-10-CM

## 2019-02-24 DIAGNOSIS — K2289 Other specified disease of esophagus: Secondary | ICD-10-CM

## 2019-02-24 DIAGNOSIS — R12 Heartburn: Secondary | ICD-10-CM

## 2019-02-24 MED ORDER — OMEPRAZOLE 40 MG PO CPDR: 40.0000 mg | DELAYED_RELEASE_CAPSULE | Freq: Two times a day (BID) | ORAL | 3 refills | Status: DC

## 2019-02-24 NOTE — Patient Instructions (Addendum)
Stop pantoprazole.   Start Omeprazole.   We have sent the following medications to your pharmacy for you to pick up at your convenience: Omeprazole  Carafate - Take 1 hour before of 2 hours after your other medicines.   You have been scheduled for an endoscopy. Please follow written instructions given to you at your visit today. If you use inhalers (even only as needed), please bring them with you on the day of your procedure.  If you are age 72 or older, your body mass index should be between 23-30. Your Body mass index is 20.93 kg/m. If this is out of the aforementioned range listed, please consider follow up with your Primary Care Provider.  Thank you for choosing me and [facility].  Dennison Bulla

## 2019-02-24 NOTE — Progress Notes (Signed)
Chief Complaint: Heartburn  HPI:    Levi [name] is a 73 year old male with a past medical history as listed below including COPD, known to Dr. Carlean Purl, who was referred to me by Aura Dials, MD for a complaint of heartburn.      02/05/2008 EGD by Dr. Talmage Nap in [facility] for epigastric pain.  This was normal.    01/27/2015 colonoscopy with 2 diminutive adenomas, repeat recommended in 2021.    02/10/2019 seen by PCP Dr. Aura Dials.  At that time patient had a history of GERD and been taking his Protonix twice a day for years but it was not helping.  At that time also described esophageal pain.  He was started on Carafate suspension 4 times daily, nystatin suspension and he was referred to [place].    Today, the patient tells me that for the past 2 months he has had an increase in epigastric discomfort which radiates up into his throat as well as actual throat pain.  Tells me that when he is swallowing sometimes it hurts for food to go down.  He just finished some Nystatin from his PCP but does not feel any difference in his symptoms.  Has been taking the Carafate 4 times a day but taking this with his other medicines and tells me this maybe helped some for a little while.  Continues on his Pantoprazole 40 mg twice daily which he has been on for "years".  Along with this the patient tells me he has an increase in hiccups which he has never experienced before.    Denies changes in medication, use of NSAIDs or changes in diet, fever, chills, changes in bowel habits or symptoms that awaken him from sleep.  Past Medical History:  Diagnosis Date  . Agent orange exposure 1970's   takes Imdur,Diltiazem, and Atacand daily  . ALLERGIC RHINITIS   . Asthma   . Bruises easily    d/t prednisone daily  . Chronic bronchitis ([place])    "used to get it q yr; last time ?2008" (04/28/2012)  . COPD (chronic obstructive pulmonary disease) (HCC)    agent orange exposure  . Degenerative disk disease     "qwhere" (04/28/2012)  . Diverticulitis   . Dysrhythmia    afib  . Elevated uric acid in blood    takes Allopurinol daily  . Enlarged prostate    but not on any meds  . GERD (gastroesophageal reflux disease)    takes Protonix daily  . H/O hiatal hernia   . Headache   . History of pneumonia    a. 2010  . Hyperlipidemia    takes Lipitor daily; pt. states he takes a preventive  . Joint pain   . Joint swelling   . PAF (paroxysmal atrial fibrillation) ([facility])    a. Dx 04/2012, CHA2DS2VASc = 1 (age);  b. 04/2012 Echo: EF 40-50%, mild MR.  . Personal history of colonic adenomas 02/09/2013  . Shortness of breath dyspnea     Past Surgical History:  Procedure Laterality Date  . ANTERIOR CERVICAL DECOMP/DISCECTOMY FUSION N/A 07/21/2012   Procedure: ANTERIOR CERVICAL DECOMPRESSION/DISCECTOMY FUSION 2 LEVELS;  Surgeon: [name], MD;  Location: [facility];  Service: Neurosurgery;  Laterality: N/A;  C4-5 C5-6 Anterior cervical decompression/diskectomy/fusion  . ANTERIOR LAT LUMBAR FUSION N/A 04/07/2017   Procedure: Thoracic twelve-Lumbar one Anterolateral decompression/fusion;  Surgeon: [name], MD;  Location: [facility];  Service: Neurosurgery;  Laterality: N/A;  . BACK SURGERY  x 3  . CARDIAC CATHETERIZATION  11/2008   [name] - 20% calcified non flow limiting left main, 50% EF apical hypokinesis  . CERVICAL DISC ARTHROPLASTY N/A 04/07/2017   Procedure: Cervical six-seven Disc arthroplasty;  Surgeon: [name], MD;  Location: [facility];  Service: Neurosurgery;  Laterality: N/A;  . CHEST TUBE INSERTION Left 04/11/2017   Procedure: CHEST TUBE INSERTION;  Surgeon: [name], MD;  Location: [facility];  Service: Thoracic;  Laterality: Left;  . CHOLECYSTECTOMY    . COLONOSCOPY    . ESOPHAGOGASTRODUODENOSCOPY    . EYE SURGERY Bilateral 2011   steroidal encapsulation" (04/28/2012)  . FOOT SURGERY Right    x 2  . KNEE ARTHROSCOPY Bilateral   . LATERAL / POSTERIOR COMBINED FUSION  LUMBAR SPINE  2012  . REVERSE SHOULDER ARTHROPLASTY  04/28/2012   Procedure: REVERSE SHOULDER ARTHROPLASTY;  Surgeon: [name], MD;  Location: [facility];  Service: Orthopedics;  Laterality: Left;  Left shouder reverse total shoulder arthroplasty  . SHOULDER SURGERY    . [place]  . TOTAL HIP ARTHROPLASTY Left 2011   "left" (04/28/2012)  . TOTAL HIP ARTHROPLASTY Right 07/31/2015   Procedure: TOTAL HIP ARTHROPLASTY ANTERIOR APPROACH;  Surgeon: [name], MD;  Location: [facility];  Service: Orthopedics;  Laterality: Right;  . TOTAL KNEE ARTHROPLASTY  01/10/2012   Procedure: TOTAL KNEE ARTHROPLASTY;  Surgeon: [name], MD;  Location: [facility];  Service: Orthopedics;;  left total knee arthroplasty  . TOTAL KNEE ARTHROPLASTY Right 06/17/2014   Procedure: TOTAL KNEE ARTHROPLASTY;  Surgeon: [name], MD;  Location: [facility];  Service: Orthopedics;  Laterality: Right;    Current Outpatient Medications  Medication Sig Dispense Refill  . albuterol (PROVENTIL HFA;VENTOLIN HFA) 108 (90 Base) MCG/ACT inhaler TAKE 2 PUFFS BY MOUTH EVERY 4 HOURS AS NEEDED FOR WHEEZE 8.5 Inhaler 12  . allopurinol (ZYLOPRIM) 100 MG tablet Take 100 mg by mouth daily.      [name] aspirin EC 81 MG tablet Take 1 tablet (81 mg total) by mouth daily. 90 tablet 3  . atorvastatin (LIPITOR) 20 MG tablet TAKE 1 TABLET BY MOUTH EVERY DAY 90 tablet 1  . beclomethasone (QVAR) 80 MCG/ACT inhaler Inhale 2 puffs into the lungs 2 (two) times daily. 1 Inhaler 12  . candesartan (ATACAND) 4 MG tablet TAKE 1/2 TABLETS (2 MG TOTAL) BY MOUTH DAILY. 45 tablet 3  . diltiazem (CARDIZEM CD) 180 MG 24 hr capsule TAKE 1 CAPSULE BY MOUTH EVERY DAY 90 capsule 3  . diltiazem (CARDIZEM) 30 MG tablet Take 1 tablet every 4 hours AS NEEDED for afib HR >100 and BP >100 15 tablet 6  . furosemide (LASIX) 20 MG tablet Take 1 tablet (20 mg total) by mouth daily as needed. For shortness of breath and lower limb edema 90 tablet 3  .  ipratropium-albuterol (DUONEB) 0.5-2.5 (3) MG/3ML SOLN 1 NEBULE EVERY 6 HOURS AS NEEDED. **J45.3** 270 mL 4  . isosorbide mononitrate (IMDUR) 30 MG 24 hr tablet TAKE 1 TABLET (30 MG TOTAL) BY MOUTH DAILY. 30 tablet 11  . Nebulizers (COMPRESSOR/NEBULIZER) MISC Use as directed 1 each 0  . Olodaterol HCl (STRIVERDI RESPIMAT) 2.5 MCG/ACT AERS INHALE 2 PUFFS INTO THE LUNGS DAILY. 1 Inhaler 12  . pantoprazole (PROTONIX) 40 MG tablet Take 1 tablet (40 mg total) by mouth 2 (two) times daily. 60 tablet 5  . predniSONE (DELTASONE) 10 MG tablet TAKE 1 TABLET (10 MG TOTAL) BY MOUTH DAILY. CONTINUOUS  30 tablet 4  . tamsulosin (FLOMAX) 0.4 MG CAPS capsule Take 0.4 mg daily with supper by mouth.   11   No current facility-administered medications for this visit.     Allergies as of 02/24/2019 - Review Complete 12/16/2018  Allergen Reaction Noted  . Morphine and related Palpitations and Other (See Comments) 01/10/2012  . Levaquin [levofloxacin] Itching   . Fluticasone-salmeterol  04/20/2018    Family History  Problem Relation Age of Onset  . Heart disease Father        Died 74, MI  . Diabetes Mother   . Prostate cancer Brother   . Colon cancer Neg Hx   . Esophageal cancer Neg Hx   . Rectal cancer Neg Hx   . Stomach cancer Neg Hx   . Pancreatic cancer Neg Hx   . Kidney disease Neg Hx   . Liver disease Neg Hx     Social History   Socioeconomic History  . Marital status: Married    Spouse name: Not on file  . Number of children: Not on file  . Years of education: Not on file  . Highest education level: Not on file  Occupational History  . 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 Financial resource strain: Not on file  . Food insecurity    Worry: Not on file    Inability: Not on file  . Transportation needs    Medical: Not on file    Non-medical: Not on file  Tobacco Use  . Smoking  status: Never Smoker  . Smokeless tobacco: Never Used  Substance and Sexual Activity  . Alcohol use: No    Alcohol/week: 0.0 standard drinks  . Drug use: No  . Sexual activity: Yes  Lifestyle  . Physical activity    Days per week: Not on file    Minutes per session: Not on file  . Stress: Not on file  Relationships  . Social Herbalist on phone: Not on file    Gets together: Not on file    Attends religious service: Not on file    Active member of club or organization: Not on file    Attends meetings of clubs or organizations: Not on file    Relationship status: Not on file  . Intimate partner violence    Fear of current or ex partner: Not on file    Emotionally abused: Not on file    Physically abused: Not on file    Forced sexual activity: Not on file  Other Topics Concern  . 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, Radiographer, therapeutic    Review of Systems:    Constitutional: No weight loss, fever or chills Skin: No rash  Cardiovascular: No chest pain Respiratory: No SOB  Gastrointestinal: See HPI and otherwise negative Genitourinary: No dysuria  Neurological: No headache Musculoskeletal: No new muscle or joint pain Hematologic: No bleeding  Psychiatric: No history of depression or anxiety   Physical Exam:  Vital signs: BP 100/60   Pulse 81   Temp 98.4 F (36.9 C)   Ht 6\' 2"  (1.88 m)   Wt 163 lb (73.9 kg)   BMI 20.93 kg/m   Constitutional:   Pleasant Caucasian male  appears to be in NAD, Well developed, Well nourished, alert and cooperative Head:  Normocephalic and atraumatic. Eyes:   PEERL, EOMI. No icterus. Conjunctiva pink. Ears:  Normal auditory acuity. Neck:  Supple Throat: Oral cavity and pharynx without inflammation, swelling or lesion.  Respiratory: Respirations even and unlabored. Lungs clear to auscultation  bilaterally.   No wheezes, crackles, or rhonchi.  Cardiovascular: Normal S1, S2. No MRG. Regular rate and rhythm. No peripheral edema, cyanosis or pallor.  Gastrointestinal:  Soft, nondistended, mild epigastric ttp, No rebound or guarding. Normal bowel sounds. No appreciable masses or hepatomegaly. Rectal:  Not performed.  Msk:  Symmetrical without gross deformities. Without edema, no deformity or joint abnormality.  Neurologic:  Alert and  oriented x4;  grossly normal neurologically.  Skin:   Dry and intact without significant lesions or rashes. Psychiatric:  Demonstrates good judgement and reason without abnormal affect or behaviors.  No recent labs/imaging.  Assessment: 1.  Heartburn: Burning in his stomach and epigastric pain; consider H. pylori versus PUD versus other 2.  Esophageal pain: Empirically treated with nystatin by his PCP, no change in pain, consider esophagitis versus GERD versus other; possibly due to medication tachyphylaxis  Plan: 1.  Discussed that patient can continue Carafate but it must be taken an hour before or 2 hours after eating and other medications. 2.  Stop Pantoprazole.  Started Omeprazole 40 mg twice daily, 30 to 60 minutes for breakfast and dinner #60 with 5 refills. 3.  Scheduled patient for an EGD in the [facility] with Dr. Carlean Purl.  Did discuss risks, benefits, limitations and alternatives the patient agrees to proceed. 4.  Patient to follow in clinic per recommendations from Dr. Carlean Purl after time of procedure.  Levi Newer, Levi [name] [facility] 02/24/2019, 8:19 AM  Cc: Aura Dials, MD

## 2019-03-01 ENCOUNTER — Telehealth: Payer: Self-pay | Admitting: Internal Medicine

## 2019-03-01 NOTE — Telephone Encounter (Signed)
Do you now or have you had a fever in the last 14 days?   NO      Do you have any respiratory symptoms of shortness of breath or cough now or in the last 14 days?   NO    Do you have any family members or close contacts with diagnosed or suspected Covid-19 in the past 14 days?    NO     Have you been tested for Covid-19 and found to be positive? NO        Pt made aware to check in on 3th floor and that care partner may wait in the car or come up to the lobby during the procedure but will need to provide their own mask.

## 2019-03-02 ENCOUNTER — Other Ambulatory Visit: Payer: Self-pay

## 2019-03-02 ENCOUNTER — Encounter: Payer: Self-pay | Admitting: Internal Medicine

## 2019-03-02 ENCOUNTER — Ambulatory Visit (AMBULATORY_SURGERY_CENTER): Payer: Medicare Other | Admitting: Internal Medicine

## 2019-03-02 VITALS — BP 121/67 | HR 52 | Temp 93.2°F | Resp 22 | Ht 74.0 in | Wt 163.0 lb

## 2019-03-02 DIAGNOSIS — R1310 Dysphagia, unspecified: Secondary | ICD-10-CM | POA: Diagnosis not present

## 2019-03-02 DIAGNOSIS — R12 Heartburn: Secondary | ICD-10-CM

## 2019-03-02 DIAGNOSIS — R1013 Epigastric pain: Secondary | ICD-10-CM | POA: Diagnosis not present

## 2019-03-02 DIAGNOSIS — K222 Esophageal obstruction: Secondary | ICD-10-CM | POA: Diagnosis not present

## 2019-03-02 DIAGNOSIS — B3781 Candidal esophagitis: Secondary | ICD-10-CM

## 2019-03-02 MED ORDER — FLUCONAZOLE 100 MG PO TABS: 100.0000 mg | ORAL_TABLET | Freq: Every day | ORAL | 0 refills | Status: AC

## 2019-03-02 MED ORDER — SODIUM CHLORIDE 0.9 % IV SOLN: 500.0000 mL | Freq: Once | INTRAVENOUS | Status: DC

## 2019-03-02 NOTE — Op Note (Addendum)
[facility] Patient Name: Levi [name] Procedure Date: 03/02/2019 2:28 PM MRN: [id number] Endoscopist: Gatha Mayer , MD Age: 73 Referring MD:  Date of Birth: 1945-11-22 Gender: Male Account #: [id number] Procedure:                Upper GI endoscopy Indications:              Dysphagia, Odynophagia, Heartburn Medicines:                Propofol per Anesthesia, Monitored Anesthesia Care Procedure:                Pre-Anesthesia Assessment:                           - Prior to the procedure, a History and Physical                            was performed, and patient medications and                            allergies were reviewed. The patient's tolerance of                            previous anesthesia was also reviewed. The risks                            and benefits of the procedure and the sedation                            options and risks were discussed with the patient.                            All questions were answered, and informed consent                            was obtained. Prior Anticoagulants: The patient has                            taken no previous anticoagulant or antiplatelet                            agents. ASA Grade Assessment: III - A patient with                            severe systemic disease. After reviewing the risks                            and benefits, the patient was deemed in                            satisfactory condition to undergo the procedure.                           After obtaining informed consent, the endoscope was  passed under direct vision. Throughout the                            procedure, the patient's blood pressure, pulse, and                            oxygen saturations were monitored continuously. The                            Endoscope was introduced through the mouth, and                            advanced to the second part of duodenum. The upper    GI endoscopy was accomplished without difficulty.                            The patient tolerated the procedure well. Scope In: Scope Out: Findings:                 Patchy, white plaques were found in the entire                            esophagus. Biopsies were taken with a cold forceps                            for histology. Verification of patient                            identification for the specimen was done. Estimated                            blood loss was minimal.                           One benign-appearing, intrinsic moderate stenosis                            was found at the gastroesophageal junction. The                            stenosis was traversed.                           The exam was otherwise without abnormality.                           The cardia and gastric fundus were normal on                            retroflexion. Complications:            No immediate complications. Estimated Blood Loss:     Estimated blood loss was minimal. Impression:               - Esophageal plaques were found, consistent with  candidiasis. Biopsied.                           - Benign-appearing esophageal stenosis.                           - The examination was otherwise normal. Recommendation:           - Patient has a contact number available for                            emergencies. The signs and symptoms of potential                            delayed complications were discussed with the                            patient. Return to normal activities tomorrow.                            Written discharge instructions were provided to the                            patient.                           - Resume previous diet.                           - Continue present medications.                           - Await pathology results.                           - STOP SUCRALFATE                           FLUCONAZOLE 100 MG QD X 21 DAYS W/  200 MG DAY ONE Gatha Mayer, MD 03/02/2019 3:05:57 PM This report has been signed electronically.

## 2019-03-02 NOTE — Progress Notes (Signed)
Called to room to assist during endoscopic procedure.  Patient ID and intended procedure confirmed with present staff. Received instructions for my participation in the procedure from the performing physician.  

## 2019-03-02 NOTE — Progress Notes (Signed)
A/ox3, pleased with MAC, report to RN 

## 2019-03-02 NOTE — Patient Instructions (Addendum)
I found what looks like a yeast infection in the esophagus.  [name] was on the right track with the Nystatin - we have to use something a bit stronger - called fluconazole.  That should do the trick. I think the prednisone you are on is related to this infection - we see it in patients on prednisone.  I will call with the biopsy results and plans.  I am stopping the sucralfate (carafate). You did have a narrowing of the esophagus where it meets the stomach called a Schatzki ring - I opened that up to avoid swallowing difficulties.  I appreciate the opportunity to care for you.  Gatha Mayer, MD, FACG  YOU HAD AN ENDOSCOPIC PROCEDURE TODAY AT [facility]:   Refer to the procedure report that was given to you for any specific questions about what was found during the examination.  If the procedure report does not answer your questions, please call your gastroenterologist to clarify.  If you requested that your care partner not be given the details of your procedure findings, then the procedure report has been included in a sealed envelope for you to review at your convenience later.  YOU SHOULD EXPECT: Some feelings of bloating in the abdomen. Passage of more gas than usual.  Walking can help get rid of the air that was put into your GI tract during the procedure and reduce the bloating. If you had a lower endoscopy (such as a colonoscopy or flexible sigmoidoscopy) you may notice spotting of blood in your stool or on the toilet paper. If you underwent a bowel prep for your procedure, you may not have a normal bowel movement for a few days.  Please Note:  You might notice some irritation and congestion in your nose or some drainage.  This is from the oxygen used during your procedure.  There is no need for concern and it should clear up in a day or so.  SYMPTOMS TO REPORT IMMEDIATELY:    Following upper endoscopy (EGD)  Vomiting of blood or coffee ground material  New  chest pain or pain under the shoulder blades  Painful or persistently difficult swallowing  New shortness of breath  Fever of 100F or higher  Black, tarry-looking stools  For urgent or emergent issues, a gastroenterologist can be reached at any hour by calling [phone].   DIET:  We do recommend a small meal at first, but then you may proceed to your regular diet.  Drink plenty of fluids but you should avoid alcoholic beverages for 24 hours.  ACTIVITY:  You should plan to take it easy for the rest of today and you should NOT DRIVE or use heavy machinery until tomorrow (because of the sedation medicines used during the test).    FOLLOW UP: Our staff will call the number listed on your records 48-72 hours following your procedure to check on you and address any questions or concerns that you may have regarding the information given to you following your procedure. If we do not reach you, we will leave a message.  We will attempt to reach you two times.  During this call, we will ask if you have developed any symptoms of COVID 19. If you develop any symptoms (ie: fever, flu-like symptoms, shortness of breath, cough etc.) before then, please call [phone].  If you test positive for Covid 19 in the 2 weeks post procedure, please call and report this information to [place].  If any biopsies were taken you will be contacted by phone or by letter within the next 1-3 weeks.  Please call us at [phone] if you have not heard about the biopsies in 3 weeks.    SIGNATURES/CONFIDENTIALITY: You and/or your care partner have signed paperwork which will be entered into your electronic medical record.  These signatures attest to the fact that that the information above on your After Visit Summary has been reviewed and is understood.  Full responsibility of the confidentiality of this discharge information lies with you and/or your care-partner.

## 2019-03-02 NOTE — Progress Notes (Signed)
Pt's states no medical or surgical changes since previsit or office visit.  VITALS CW TEMP JB 

## 2019-03-04 ENCOUNTER — Telehealth: Payer: Self-pay

## 2019-03-04 DIAGNOSIS — H43813 Vitreous degeneration, bilateral: Secondary | ICD-10-CM | POA: Diagnosis not present

## 2019-03-04 NOTE — Telephone Encounter (Signed)
  Follow up Call-  Call back number 03/02/2019  Post procedure Call Back phone  # [phone]  Permission to leave phone message Yes  Some recent data might be hidden     Patient questions:  Do you have a fever, pain , or abdominal swelling? No. Pain Score  0 *  Have you tolerated food without any problems? Yes.    Have you been able to return to your normal activities? Yes.    Do you have any questions about your discharge instructions: Diet   No. Medications  No. Follow up visit  No.  Do you have questions or concerns about your Care? No.  Actions: * If pain score is 4 or above: No action needed, pain <4.  1. Have you developed a fever since your procedure? No  2.   Have you had an  No   3.   Have you tested positive for COVID 19 since your procedure No  4.   Have you had any family members/close contacts diagnosed with the COVID 19 since your procedure?  No  If yes to any of these questions please route to Joylene Frantz, RN and Alphonsa Gin, RN.

## 2019-03-04 NOTE — Telephone Encounter (Signed)
Left message on follow up call. 

## 2019-03-08 NOTE — Progress Notes (Signed)
My Chart - no letter, no recall  Barnabas Lister,  The biopsies did not show a specific problem. I may have been wrong about the yeast/fungus infection but could be sampling error also.  Please respond and let me know how you are doing.  Best regards,  Gatha Mayer, MD, [facility]

## 2019-03-09 ENCOUNTER — Telehealth: Payer: Self-pay

## 2019-03-09 NOTE — Telephone Encounter (Signed)
I have filled out a BENECARD form and I will fax it back to [phone]. This has been done to get patient his omeprazole  40mg  for BID. Sent office notes from her 02/24/2019 visit with Ellouise Newer PA-C.

## 2019-03-11 NOTE — Telephone Encounter (Signed)
We received a fax that patients omeprazole has been approved thru 03/07/2020. Tried to let patient know and his voice mail was full.

## 2019-03-12 NOTE — Telephone Encounter (Signed)
I was able to reach patient's voicemail today and left him a message that his medicine was approved.

## 2019-03-15 ENCOUNTER — Other Ambulatory Visit: Payer: Self-pay | Admitting: Cardiology

## 2019-03-17 DIAGNOSIS — R972 Elevated prostate specific antigen [PSA]: Secondary | ICD-10-CM | POA: Diagnosis not present

## 2019-03-18 DIAGNOSIS — T23269A Burn of second degree of back of unspecified hand, initial encounter: Secondary | ICD-10-CM | POA: Diagnosis not present

## 2019-03-18 DIAGNOSIS — M25562 Pain in left knee: Secondary | ICD-10-CM | POA: Diagnosis not present

## 2019-03-18 DIAGNOSIS — M79671 Pain in right foot: Secondary | ICD-10-CM | POA: Diagnosis not present

## 2019-03-25 DIAGNOSIS — N400 Enlarged prostate without lower urinary tract symptoms: Secondary | ICD-10-CM | POA: Diagnosis not present

## 2019-04-01 NOTE — Progress Notes (Signed)
CARDIOLOGY OFFICE NOTE  Date:  04/07/2019    Levi [name] Date of Birth: 1946/02/25 Medical Record [id number]  PCP:  Orpah Melter, MD  Cardiologist:  Marisa Cyphers    Chief Complaint  Patient presents with  . Follow-up    Seen for [name]    History of Present Illness: Levi [name] is a 73 y.o. male who presents today for a 4 month check.  Seen for [name].   He has a history of PAFfirst notedin December of 2013then again in 2016. He is on low dose CCB therapy and low dose aspirin.Echocardiogram demonstrated EF in the 50% range. Prior catheterization in 2010 showed no flow-limiting CAD.Other issues include COPD, spine surgery, occasional palpitation.He has had agent orange exposure.He had a prior lumbar fusion in [id number] complicated by PTX requiring chest tube placement. He has had CHADSVASC of 1 - has wanted to wait until over 76 to start anticoagulation if needed.I saw him back in December of 2019. He had had his echo updated - EF 40 to 45%. No beta blocker due to COPD. Intermittent swelling - felt to be due to CCB therapy. Using Lasix prn. Last seen by [name] in January of 2020. Cardiac status ok - had had a bout of ?vertigo and saw neurology with MRI.  I last saw him in June for a telehealth visit. - he was using more Lasix for lower extremity edema. Had seen pulmonary who was concerned about his diuretic use and low BP. He was getting ready to have PFTs. CXR was done. Was not dizzy. Breathing probably a little worse. We sent him for labs here - BNP totally normal. Then seen in the office here in July - he was doing better - swelling had improved - using less Lasix. Very little AF per his report.   The patient does not have symptoms concerning for COVID-19 infection (fever, chills, cough, or new shortness of breath).   Comes in today. Here alone. He is doing ok. Notes his legs will still swelling on occasion - typically goes down overnight. Using  very little Lasix. Has had some GI issues with his esophagus. Also having neck/back issues and going back to see [name]. He feels like his heart is ok. No chest pain. He is going to the gym regularly and exercising without issue. Tolerating his medicines. Weight is down a few pounds. He feels like his breathing is stable.   Past Medical History:  Diagnosis Date  . Agent orange exposure 1970's   takes Imdur,Diltiazem, and Atacand daily  . ALLERGIC RHINITIS   . Asthma   . Bruises easily    d/t prednisone daily  . Chronic bronchitis ([facility])    "used to get it q yr; last time ?2008" (04/28/2012)  . COPD (chronic obstructive pulmonary disease) ([facility])    agent orange exposure  . Degenerative disk disease    "qwhere" (04/28/2012)  . Diverticulitis   . Dysrhythmia    afib  . Elevated uric acid in blood    takes Allopurinol daily  . Enlarged prostate    but not on any meds  . GERD (gastroesophageal reflux disease)    takes Protonix daily  . H/O hiatal hernia   . Headache   . History of pneumonia    a. 2010  . Hyperlipidemia    takes Lipitor daily; pt. states he takes a preventive  . Joint pain   . Joint swelling   . PAF (  paroxysmal atrial fibrillation) ([place])    a. Dx 04/2012, CHA2DS2VASc = 1 (age);  b. 04/2012 Echo: EF 40-50%, mild MR.  . Personal history of colonic adenomas 02/09/2013  . Shortness of breath dyspnea     Past Surgical History:  Procedure Laterality Date  . ANTERIOR CERVICAL DECOMP/DISCECTOMY FUSION N/A 07/21/2012   Procedure: ANTERIOR CERVICAL DECOMPRESSION/DISCECTOMY FUSION 2 LEVELS;  Surgeon: [name], MD;  Location: [facility];  Service: Neurosurgery;  Laterality: N/A;  C4-5 C5-6 Anterior cervical decompression/diskectomy/fusion  . ANTERIOR LAT LUMBAR FUSION N/A 04/07/2017   Procedure: Thoracic twelve-Lumbar one Anterolateral decompression/fusion;  Surgeon: [name], MD;  Location: [place];  Service: Neurosurgery;  Laterality: N/A;  . BACK SURGERY     x 3   . CARDIAC CATHETERIZATION  11/2008   [name] - 20% calcified non flow limiting left main, 50% EF apical hypokinesis  . CERVICAL DISC ARTHROPLASTY N/A 04/07/2017   Procedure: Cervical six-seven Disc arthroplasty;  Surgeon: [name], MD;  Location: [facility] [place];  Service: Neurosurgery;  Laterality: N/A;  . CHEST TUBE INSERTION Left 04/11/2017   Procedure: CHEST TUBE INSERTION;  Surgeon: [name], MD;  Location: [facility];  Service: Thoracic;  Laterality: Left;  . CHOLECYSTECTOMY    . COLONOSCOPY    . ESOPHAGOGASTRODUODENOSCOPY    . EYE SURGERY Bilateral 2011   steroidal encapsulation" (04/28/2012)  . FOOT SURGERY Right    x 2  . KNEE ARTHROSCOPY Bilateral   . LATERAL / POSTERIOR COMBINED FUSION LUMBAR SPINE  2012  . REVERSE SHOULDER ARTHROPLASTY  04/28/2012   Procedure: REVERSE SHOULDER ARTHROPLASTY;  Surgeon: [name], MD;  Location: [facility];  Service: Orthopedics;  Laterality: Left;  Left shouder reverse total shoulder arthroplasty  . SHOULDER SURGERY    . [place]  . TOTAL HIP ARTHROPLASTY Left 2011   "left" (04/28/2012)  . TOTAL HIP ARTHROPLASTY Right 07/31/2015   Procedure: TOTAL HIP ARTHROPLASTY ANTERIOR APPROACH;  Surgeon: [name], MD;  Location: [place];  Service: Orthopedics;  Laterality: Right;  . TOTAL KNEE ARTHROPLASTY  01/10/2012   Procedure: TOTAL KNEE ARTHROPLASTY;  Surgeon: [name], MD;  Location: [place];  Service: Orthopedics;;  left total knee arthroplasty  . TOTAL KNEE ARTHROPLASTY Right 06/17/2014   Procedure: TOTAL KNEE ARTHROPLASTY;  Surgeon: [name], MD;  Location: [facility];  Service: Orthopedics;  Laterality: Right;     Medications: Current Meds  Medication Sig  . albuterol (PROVENTIL HFA;VENTOLIN HFA) 108 (90 Base) MCG/ACT inhaler TAKE 2 PUFFS BY MOUTH EVERY 4 HOURS AS NEEDED FOR WHEEZE  . allopurinol (ZYLOPRIM) 100 MG tablet Take 100 mg by mouth daily.    [name] aspirin EC 81 MG tablet Take 1 tablet (81 mg total)  by mouth daily.  [name] atorvastatin (LIPITOR) 20 MG tablet TAKE 1 TABLET BY MOUTH EVERY DAY  . beclomethasone (QVAR) 80 MCG/ACT inhaler Inhale 2 puffs into the lungs 2 (two) times daily.  . candesartan (ATACAND) 4 MG tablet TAKE 1/2 TABLETS (2 MG TOTAL) BY MOUTH DAILY.  [name] diltiazem (CARDIZEM CD) 180 MG 24 hr capsule TAKE 1 CAPSULE BY MOUTH EVERY DAY  . diltiazem (CARDIZEM) 30 MG tablet Take 1 tablet every 4 hours AS NEEDED for afib HR >100 and BP >100  . furosemide (LASIX) 20 MG tablet Take 1 tablet (20 mg total) by mouth daily as needed. For shortness of breath and lower limb edema  . ipratropium-albuterol (DUONEB) 0.5-2.5 (3) MG/3ML SOLN 1 NEBULE EVERY 6  HOURS AS NEEDED. **J45.3**  . isosorbide mononitrate (IMDUR) 30 MG 24 hr tablet TAKE 1 TABLET (30 MG TOTAL) BY MOUTH DAILY.  . Nebulizers (COMPRESSOR/NEBULIZER) MISC Use as directed  . Olodaterol HCl (STRIVERDI RESPIMAT) 2.5 MCG/ACT AERS INHALE 2 PUFFS INTO THE LUNGS DAILY.  [name] omeprazole (PRILOSEC) 40 MG capsule Take 40 mg by mouth 2 (two) times daily before a meal.  . predniSONE (DELTASONE) 10 MG tablet TAKE 1 TABLET (10 MG TOTAL) BY MOUTH DAILY. CONTINUOUS  . tamsulosin (FLOMAX) 0.4 MG CAPS capsule Take 0.4 mg daily with supper by mouth.      Allergies: Allergies  Allergen Reactions  . Morphine And Related Palpitations and Other (See Comments)    Light headed and sweaty; "my heart races" (04/28/2012), pt states too much morphine gave him this reaction  . Levaquin [Levofloxacin] Itching  . Fluticasone-Salmeterol     Social History: The patient  reports that he has never smoked. He has never used smokeless tobacco. He reports that he does not drink alcohol or use drugs.   Family History: The patient's family history includes Diabetes in his mother; Heart disease in his father; Prostate cancer in his brother.   Review of Systems: Please see the history of present illness.   All other systems are reviewed and negative.   Physical Exam:  VS:  BP 100/68 (BP Location: Left Arm, Patient Position: Sitting, Cuff Size: Normal)   Pulse 96   Ht 6\' 2"  (1.88 m)   Wt 159 lb 1.9 oz (72.2 kg)   SpO2 96%   BMI 20.43 kg/m  .  BMI Body mass index is 20.43 kg/m.  Wt Readings from Last 3 Encounters:  04/07/19 159 lb 1.9 oz (72.2 kg)  03/02/19 163 lb (73.9 kg)  02/24/19 163 lb (73.9 kg)    General: Pleasant. Alert and in no acute distress.His weight is down a few pounds.    HEENT: Normal. Voice is raspy.  Neck: Supple, no JVD, carotid bruits, or masses noted.  Cardiac: Regular rate and rhythm. HR down at 88 by the time of my exam and regularly. Just trace edema.  Respiratory:  Lungs are clear to auscultation bilaterally with normal work of breathing.  GI: Soft and nontender.  MS: No deformity or atrophy. Gait and ROM intact.  Skin: Warm and dry. Color is normal.  Neuro:  Strength and sensation are intact and no gross focal deficits noted.  Psych: Alert, appropriate and with normal affect.   LABORATORY DATA:  EKG:  EKG is not ordered today.  Lab Results  Component Value Date   WBC 9.2 10/30/2018   HGB 14.6 10/30/2018   HCT 43.4 10/30/2018   PLT 170 10/30/2018   GLUCOSE 108 (H) 10/30/2018   CHOL 193 04/29/2018   TRIG 84 04/29/2018   HDL 109 04/29/2018   LDLCALC 67 04/29/2018   ALT 29 04/29/2018   AST 32 04/29/2018   NA 146 (H) 10/30/2018   K 3.8 10/30/2018   CL 106 10/30/2018   CREATININE 1.18 10/30/2018   BUN 23 10/30/2018   CO2 25 10/30/2018   TSH 1.979 05/10/2012   INR 0.98 07/25/2015     BNP (last 3 results) No results for input(s): BNP in the last 8760 hours.  ProBNP (last 3 results) Recent Labs    10/30/18 0945  PROBNP 33     Other Studies Reviewed Today:  CXR 09/2018 IMPRESSION: 1. No acute cardiopulmonary disease. 2. Stable hyperexpansion. 3. Postsurgical changes  EchoStudy Conclusions6/2019  -  Left ventricle: The cavity size was normal. Wall thickness was normal. Systolic  function was mildly to moderately reduced. The estimated ejection fraction was in the range of 40% to 45%. Diffuse hypokinesis. Features are consistent with a pseudonormal left ventricular filling pattern, with concomitant abnormal relaxation and increased filling pressure (grade 2 diastolic dysfunction). - Right atrium: The atrium was mildly dilated.   ASSESSMENT & PLAN:   1. PAF -he has not had documented recurrence since 2016 - CHADSVASC of just 1 - he has opted for just aspirin until he turns 76. Remains in NSR by exam today.   2. COPD- followed by pulmonary - he notes his breathing is stable at this time.   3. HLD - on statin- lipids from December noted.  4. Intermittent swelling -on CCB therapy - this is chronic - typically goes away overnight. Weight is stable. Rarely using lasix. Would follow.   5. Mild LV dysfunction - he has not been able to be on ideal therapy due to chronically soft BP - also with COPD that has been progressive which limits use of beta blocker. His swelling is stable. Last echo noted. He feels like he is doing ok. We have opted to continue to follow for now.   6. Neck/back pain - to see [name] again  7. GI issues - has had his PPI switched and has planned follow up.   8. COVID-19 Education: The signs and symptoms of COVID-19 were discussed with the patient and how to seek care for testing (follow up with PCP or arrange E-visit).  The importance of social distancing, staying at home, hand hygiene and wearing a mask when out in public were discussed today.  Current medicines are reviewed with the patient today.  The patient does not have concerns regarding medicines other than what has been noted above.  The following changes have been made:  See above.  Labs/ tests ordered today include:   No orders of the defined types were placed in this encounter.    Disposition:   FU with [name] in 6 months.    Patient is agreeable  to this plan and will call if any problems develop in the interim.   SignedTruitt Merle, NP  04/07/2019 9:13 AM  [facility] [address] [place], [place]  [address] Phone: [phone] Fax: [phone]

## 2019-04-07 ENCOUNTER — Ambulatory Visit (INDEPENDENT_AMBULATORY_CARE_PROVIDER_SITE_OTHER): Payer: Medicare Other | Admitting: Nurse Practitioner

## 2019-04-07 ENCOUNTER — Encounter: Payer: Self-pay | Admitting: Nurse Practitioner

## 2019-04-07 ENCOUNTER — Other Ambulatory Visit: Payer: Self-pay

## 2019-04-07 VITALS — BP 100/68 | HR 96 | Ht 74.0 in | Wt 159.1 lb

## 2019-04-07 DIAGNOSIS — I5022 Chronic systolic (congestive) heart failure: Secondary | ICD-10-CM | POA: Diagnosis not present

## 2019-04-07 DIAGNOSIS — I480 Paroxysmal atrial fibrillation: Secondary | ICD-10-CM | POA: Diagnosis not present

## 2019-04-07 DIAGNOSIS — R609 Edema, unspecified: Secondary | ICD-10-CM

## 2019-04-07 DIAGNOSIS — Z7189 Other specified counseling: Secondary | ICD-10-CM

## 2019-04-07 DIAGNOSIS — J449 Chronic obstructive pulmonary disease, unspecified: Secondary | ICD-10-CM | POA: Diagnosis not present

## 2019-04-07 NOTE — Patient Instructions (Addendum)

## 2019-04-13 DIAGNOSIS — M19072 Primary osteoarthritis, left ankle and foot: Secondary | ICD-10-CM | POA: Diagnosis not present

## 2019-04-13 DIAGNOSIS — M19071 Primary osteoarthritis, right ankle and foot: Secondary | ICD-10-CM | POA: Diagnosis not present

## 2019-04-13 DIAGNOSIS — M7751 Other enthesopathy of right foot: Secondary | ICD-10-CM | POA: Diagnosis not present

## 2019-04-13 DIAGNOSIS — B351 Tinea unguium: Secondary | ICD-10-CM | POA: Diagnosis not present

## 2019-04-13 DIAGNOSIS — M778 Other enthesopathies, not elsewhere classified: Secondary | ICD-10-CM | POA: Diagnosis not present

## 2019-04-13 DIAGNOSIS — M19079 Primary osteoarthritis, unspecified ankle and foot: Secondary | ICD-10-CM | POA: Diagnosis not present

## 2019-04-14 DIAGNOSIS — M5431 Sciatica, right side: Secondary | ICD-10-CM | POA: Diagnosis not present

## 2019-04-14 DIAGNOSIS — M5135 Other intervertebral disc degeneration, thoracolumbar region: Secondary | ICD-10-CM | POA: Diagnosis not present

## 2019-04-14 DIAGNOSIS — M9902 Segmental and somatic dysfunction of thoracic region: Secondary | ICD-10-CM | POA: Diagnosis not present

## 2019-04-14 DIAGNOSIS — M9903 Segmental and somatic dysfunction of lumbar region: Secondary | ICD-10-CM | POA: Diagnosis not present

## 2019-04-14 DIAGNOSIS — M9904 Segmental and somatic dysfunction of sacral region: Secondary | ICD-10-CM | POA: Diagnosis not present

## 2019-04-14 DIAGNOSIS — M5137 Other intervertebral disc degeneration, lumbosacral region: Secondary | ICD-10-CM | POA: Diagnosis not present

## 2019-04-17 DIAGNOSIS — M9903 Segmental and somatic dysfunction of lumbar region: Secondary | ICD-10-CM | POA: Diagnosis not present

## 2019-04-17 DIAGNOSIS — M5137 Other intervertebral disc degeneration, lumbosacral region: Secondary | ICD-10-CM | POA: Diagnosis not present

## 2019-04-17 DIAGNOSIS — M5431 Sciatica, right side: Secondary | ICD-10-CM | POA: Diagnosis not present

## 2019-04-17 DIAGNOSIS — M9902 Segmental and somatic dysfunction of thoracic region: Secondary | ICD-10-CM | POA: Diagnosis not present

## 2019-04-17 DIAGNOSIS — M9904 Segmental and somatic dysfunction of sacral region: Secondary | ICD-10-CM | POA: Diagnosis not present

## 2019-04-17 DIAGNOSIS — M5135 Other intervertebral disc degeneration, thoracolumbar region: Secondary | ICD-10-CM | POA: Diagnosis not present

## 2019-04-20 ENCOUNTER — Ambulatory Visit: Payer: Medicare Other | Admitting: Internal Medicine

## 2019-04-21 DIAGNOSIS — M5416 Radiculopathy, lumbar region: Secondary | ICD-10-CM | POA: Diagnosis not present

## 2019-04-26 ENCOUNTER — Other Ambulatory Visit: Payer: Self-pay | Admitting: Neurological Surgery

## 2019-04-26 ENCOUNTER — Other Ambulatory Visit (HOSPITAL_COMMUNITY): Payer: Self-pay | Admitting: Neurological Surgery

## 2019-04-26 DIAGNOSIS — M5416 Radiculopathy, lumbar region: Secondary | ICD-10-CM

## 2019-04-26 DIAGNOSIS — M4804 Spinal stenosis, thoracic region: Secondary | ICD-10-CM

## 2019-04-26 DIAGNOSIS — M4722 Other spondylosis with radiculopathy, cervical region: Secondary | ICD-10-CM

## 2019-04-27 ENCOUNTER — Other Ambulatory Visit (HOSPITAL_COMMUNITY): Payer: Self-pay | Admitting: Neurological Surgery

## 2019-04-27 ENCOUNTER — Other Ambulatory Visit: Payer: Self-pay | Admitting: Neurological Surgery

## 2019-04-27 DIAGNOSIS — M47812 Spondylosis without myelopathy or radiculopathy, cervical region: Secondary | ICD-10-CM

## 2019-04-27 DIAGNOSIS — M5416 Radiculopathy, lumbar region: Secondary | ICD-10-CM

## 2019-04-28 ENCOUNTER — Encounter: Payer: Self-pay | Admitting: Internal Medicine

## 2019-04-28 ENCOUNTER — Ambulatory Visit (INDEPENDENT_AMBULATORY_CARE_PROVIDER_SITE_OTHER): Payer: Medicare Other | Admitting: Internal Medicine

## 2019-04-28 ENCOUNTER — Other Ambulatory Visit: Payer: Self-pay

## 2019-04-28 DIAGNOSIS — J4541 Moderate persistent asthma with (acute) exacerbation: Secondary | ICD-10-CM

## 2019-04-28 DIAGNOSIS — I480 Paroxysmal atrial fibrillation: Secondary | ICD-10-CM | POA: Diagnosis not present

## 2019-04-28 MED ORDER — PREDNISONE 10 MG PO TABS: 10.0000 mg | ORAL_TABLET | Freq: Every day | ORAL | 5 refills | Status: DC

## 2019-04-28 NOTE — Assessment & Plan Note (Signed)
Recent non-specific exacerbation. Not sure if fluid overload contributed to symptoms, but he doesn't notice change after diuresis sufficient to resolve ankle edema. Don't think he needs more meds for now. Qvar not needed if he is going to remain on prednisone Plan- stop Qvar. Continue maint pred.

## 2019-04-28 NOTE — Patient Instructions (Signed)
Prednisone script refilled  Ok to stop using Qvar  Ok to continue current meds  Please call if we can help

## 2019-04-28 NOTE — Progress Notes (Signed)
Subjective:    Patient ID: Levi [name], male    DOB: [birth year], 73 y.o.   MRN: [id number] HPI  Male never smoker followed for allergy/asthma, rhinitis, history  ABPA, complicated by PAfib, GERD PFT- 01/04/2015-minimal restriction, minimal diffusion defect, insignificant response to bronchodilator. FVC 4.55/86%, FEV1 3.28/83%, FEV1/FVC 0.72, TLC 75%, DLCO 77 Office Spirometry 10/25/16-mild restriction of exhaled volume, minimal obstruction. FVC 4.04/78%, FEV1 2.71/71%, ratio 0.67, FEF 25-75% 1.68/58% Respiratory allergy profile 10/26/15- total IgE 60, minor elevations for dust mites and Aspergillus fumigatus ---------------------------------------------------------------------------------------------------.   10/16/2018-  73 year old male never smoker followed for allergy/asthma, rhinitis, history ABPA, complicated by PAfib, GERD -----pt states breathing has worsened since LOV, recently put on lasix by cardiologist 06/03/2018; denies cough, however spits up "ropey" material denies chest pain/tightness Arrival BP 90/50, Body weight 164 lbs Taking lasix prn for edema- feels controlled. More SOB past few months, using rescue twice daily and neb about every other day.  He just thinks it is time of year, denies acute event or infection. 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 Remembers Incruse as helpful. On maintenance prednisone, so dropping Qvar.  04/28/2019- 73 year old male never smoker followed for allergy/asthma, rhinitis, history ABPA, complicated by PAfib, GERD Striverdi 2 puffs daily, pred maint 10 mg daily, Neb Duoneb, Qvar 80 2 p bid, albuterol hfa Reports some increased SOB without cough or fever over las few days with cold weather. Not sick. Using rescue twice daily. Cardiology gave lasix and his ankle edema resolved- unclear how this corresponds to his breathing.  Had flu vax. ACT score 13. Using QVAR 1x daily- can't see that it helps. Discussed meds. Continues long term maint pred,  recognizing likely adrenal dependence now.  CXR 10/16/2018- IMPRESSION: 1. No acute cardiopulmonary disease. 2. Stable hyperexpansion. 3. Postsurgical changes   ROS-see HPI   + = positive Constitutional:    weight loss, night sweats, fevers, chills, fatigue, lassitude. HEENT:    headaches, difficulty swallowing, tooth/dental problems, sore throat,       sneezing, itching, ear ache, nasal congestion, post nasal drip, snoring CV:    chest pain, orthopnea, PND, +swelling in lower extremities, anasarca,                                                   dizziness, palpitations Resp:   + shortness of breath with exertion or at rest.                productive cough,   non-productive cough, coughing up of blood.              change in color of mucus.  + wheezing.   Skin:    rash or lesions. GI:  No-   heartburn, indigestion, abdominal pain, nausea, vomiting, diarrhea,                 change in bowel habits, loss of appetite GU: dysuria, change in color of urine, no urgency or frequency.   flank pain. MS:   joint pain, stiffness, decreased range of motion, back pain. Neuro-     nothing unusual Psych:  change in mood or affect.  depression or anxiety.   memory loss.    Objective:  OBJ- Physical Exam General- Alert, Oriented, Affect-appropriate, Distress- none acute, tall/thin Skin- rash-none, lesions- none, excoriation- none Lymphadenopathy- none Head- atraumatic  Eyes- Gross vision intact, PERRLA, conjunctivae and secretions clear            Ears- Hearing, canals-normal            Nose- Clear, no-Septal dev, mucus, polyps, erosion, perforation             Throat- Mallampati II , mucosa clear , drainage- none, tonsils- atrophic Neck- flexible , trachea midline, no stridor , thyroid nl, carotid no bruit Chest - symmetrical excursion , unlabored           Heart/CV- RRR , no murmur , no gallop  , no rub, nl s1 s2                           - JVD- none , edema- none, stasis changes-  none, varices+ Left calf           Lung- +distant/ clear wheeze- none, cough- none , dullness-none, rub- none           Chest wall-  Abd-  Br/ Gen/ Rectal- Not done, not indicated Extrem- cyanosis- none, clubbing, none, atrophy- none, strength- nl Neuro- + head bob tremor    Assessment & Plan:

## 2019-04-28 NOTE — Assessment & Plan Note (Signed)
Rhythm feels regular at this visit

## 2019-04-30 DIAGNOSIS — K4090 Unilateral inguinal hernia, without obstruction or gangrene, not specified as recurrent: Secondary | ICD-10-CM | POA: Diagnosis not present

## 2019-05-03 ENCOUNTER — Other Ambulatory Visit: Payer: Self-pay | Admitting: Internal Medicine

## 2019-05-06 ENCOUNTER — Other Ambulatory Visit: Payer: Self-pay | Admitting: Cardiology

## 2019-05-12 ENCOUNTER — Ambulatory Visit (HOSPITAL_COMMUNITY)
Admission: RE | Admit: 2019-05-12 | Discharge: 2019-05-12 | Disposition: A | Discharge status: Home / Self Care | Payer: Medicare Other | Source: Ambulatory Visit | Attending: Neurological Surgery | Admitting: Neurological Surgery

## 2019-05-12 ENCOUNTER — Other Ambulatory Visit: Payer: Self-pay

## 2019-05-12 DIAGNOSIS — M47812 Spondylosis without myelopathy or radiculopathy, cervical region: Secondary | ICD-10-CM

## 2019-05-12 DIAGNOSIS — M5134 Other intervertebral disc degeneration, thoracic region: Secondary | ICD-10-CM | POA: Diagnosis not present

## 2019-05-12 DIAGNOSIS — M4714 Other spondylosis with myelopathy, thoracic region: Secondary | ICD-10-CM | POA: Diagnosis not present

## 2019-05-12 DIAGNOSIS — M5416 Radiculopathy, lumbar region: Secondary | ICD-10-CM

## 2019-05-12 DIAGNOSIS — Z981 Arthrodesis status: Secondary | ICD-10-CM | POA: Insufficient documentation

## 2019-05-12 DIAGNOSIS — M48061 Spinal stenosis, lumbar region without neurogenic claudication: Secondary | ICD-10-CM | POA: Diagnosis not present

## 2019-05-12 DIAGNOSIS — M4804 Spinal stenosis, thoracic region: Secondary | ICD-10-CM

## 2019-05-12 DIAGNOSIS — M4722 Other spondylosis with radiculopathy, cervical region: Secondary | ICD-10-CM

## 2019-05-12 MED ORDER — LIDOCAINE HCL (PF) 1 % IJ SOLN
5.0000 mL | Freq: Once | INTRAMUSCULAR | Status: AC
  Administered 2019-05-12: 2 mL via INTRADERMAL

## 2019-05-12 MED ORDER — DIAZEPAM 5 MG PO TABS
10.0000 mg | ORAL_TABLET | Freq: Once | ORAL | Status: AC
  Administered 2019-05-12: 10 mg via ORAL
  Filled 2019-05-12: qty 2

## 2019-05-12 MED ORDER — IOHEXOL 300 MG/ML  SOLN
10.0000 mL | Freq: Once | INTRAMUSCULAR | Status: AC | PRN
  Administered 2019-05-12: 7 mL via INTRATHECAL

## 2019-05-12 MED ORDER — OXYCODONE-ACETAMINOPHEN 5-325 MG PO TABS: 1.0000 | ORAL_TABLET | ORAL | Status: DC | PRN

## 2019-05-12 MED ORDER — ONDANSETRON HCL 4 MG/2ML IJ SOLN: 4.0000 mg | Freq: Four times a day (QID) | INTRAMUSCULAR | Status: DC | PRN

## 2019-05-12 NOTE — Procedures (Signed)
Mr. Levi [name] is a 73 year old individual whose had a fusion from the thoracolumbar junction to the sacrum.  He also has had cervical spondylosis with myelopathy and has had a decompression and fusion there he has implanted metal at multiple levels.  There is some concern that he may have a thoracic myelopathy and a total myelogram is been suggested to evaluate both the surgical sites in the cervical and lumbar spines and the thoracic region.  Pre op Dx: Thoracic myelopathy, history of cervical fusion and lumbar fusion for stenosis Post op Dx: Same Procedure: Total myelogram Surgeon: Ellene Route Puncture level: L2-3 Fluid color: Clear colorless Injection: Iohexol 300, 7 mL Findings: Questionable pseudoarthrosis at L4-L5, mild spondylosis in the thoracic spine, further evaluation with CT scanning.

## 2019-05-12 NOTE — Discharge Instructions (Signed)
Myelogram  A myelogram is an imaging test. This test checks for problems in the spinal cord and the places where nerves attach to the spinal cord (nerve roots). A dye (contrast material) is put into your spine before the X-ray. This provides a clearer image for your doctor to see. You may need this test if you have a spinal cord problem that cannot be diagnosed with other imaging tests. You may also have this test to check your spine after surgery. Tell a doctor about:  Any allergies you have, especially to iodine.  All medicines you are taking, including vitamins, herbs, eye drops, creams, and over-the-counter medicines.  Any problems you or family members have had with anesthetic medicines or dye.  Any blood disorders you have.  Any surgeries you have had.  Any medical conditions you have or have had, including asthma.  Whether you are pregnant or may be pregnant. What are the risks? Generally, this is a safe procedure. However, problems may occur, including:  Infection.  Bleeding.  Allergic reaction to medicines or dyes.  Damage to your spinal cord or nerves.  Leaking of spinal fluid. This can cause a headache.  Damage to kidneys.  Seizures. This is rare. What happens before the procedure?  Follow instructions from your doctor about what you cannot eat or drink. You may be asked to drink more fluids.  Ask your doctor about changing or stopping your normal medicines. This is important if you take diabetes medicines or blood thinners.  Plan to have someone take you home from the hospital or clinic.  If you will be going home right after the procedure, plan to have someone with you for 24 hours. What happens during the procedure?  You will lie face down on a table.  Your doctor will find the best injection site on your spine. This is most often in the lower back.  This area will be washed with soap.  You will be given a medicine to numb the area (local  anesthetic).  Your doctor will place a long needle into the space around your spinal cord.  A sample of spinal fluid may be taken. This may be sent to the lab for testing.  The dye will be injected into the space around your spinal cord.  The exam table may be tilted. This helps the dye flow up or down your spine.  The X-ray will take images of your spinal cord.  A bandage (dressing) may be placed over the area where the dye was injected. The procedure may vary among doctors and hospitals. What can I expect after the procedure?  You may be monitored until you leave the hospital or clinic. This includes checking your blood pressure, heart rate, breathing rate, and blood oxygen level.  You may feel sore at the injection site. You may have a mild headache.  You will be told to lie flat with your head raised (elevated). This lowers the risk of a headache.  It is up to you to get the results of your procedure. Ask your doctor, or the department that is doing the procedure, when your results will be ready. Follow these instructions at home:   Rest as told by your doctor. Lie flat with your head slightly elevated.  Do not bend, lift, or do hard work for 24-48 hours, or as told by your doctor.  Take over-the-counter and prescription medicines only as told by your doctor.  Take care of your bandage as told by your   doctor.  Drink enough fluid to keep your pee (urine) pale yellow.  Bathe or shower as told by your doctor. Contact a doctor if:  You have a fever.  You have a headache that lasts longer than 24 hours.  You feel sick to your stomach (nauseous).  You vomit.  Your neck is stiff.  Your legs feel numb.  You cannot pee.  You cannot poop (no bowel movement).  You have a rash.  You are itchy or sneezing. Get help right away if:  You have new symptoms or your symptoms get worse.  You have a seizure.  You have trouble breathing. Summary  A myelogram is an  imaging test that checks for problems in the spinal cord and the places where nerves attach to the spinal cord (nerve roots).  Before the procedure, follow instructions from your doctor. You will be told what not to eat or drink, or what medicines to change or stop.  After the procedure, you will be told to lie flat with your head raised (elevated). This will lower your risk of a headache.  Do not bend, lift, or do any hard work for 24-48 hours, or as told by your doctor.  Contact a doctor if you have a stiff neck or numb legs. Get help right away if your symptoms get worse, or you have a seizure or trouble breathing. This information is not intended to replace advice given to you by your health care provider. Make sure you discuss any questions you have with your health care provider. Document Released: 02/20/2008 Document Revised: 07/22/2018 Document Reviewed: 07/23/2018 Elsevier Patient Education  2020 [employer].

## 2019-05-14 ENCOUNTER — Ambulatory Visit: Payer: Medicare Other | Admitting: Physician Assistant

## 2019-05-17 ENCOUNTER — Encounter: Payer: Self-pay | Admitting: Physician Assistant

## 2019-05-17 ENCOUNTER — Ambulatory Visit (INDEPENDENT_AMBULATORY_CARE_PROVIDER_SITE_OTHER): Payer: Medicare Other | Admitting: Physician Assistant

## 2019-05-17 VITALS — BP 90/64 | HR 76 | Temp 97.4°F | Ht 74.0 in | Wt 160.0 lb

## 2019-05-17 DIAGNOSIS — B370 Candidal stomatitis: Secondary | ICD-10-CM | POA: Diagnosis not present

## 2019-05-17 DIAGNOSIS — B3781 Candidal esophagitis: Secondary | ICD-10-CM | POA: Diagnosis not present

## 2019-05-17 MED ORDER — OMEPRAZOLE 40 MG PO CPDR: 40.0000 mg | DELAYED_RELEASE_CAPSULE | Freq: Two times a day (BID) | ORAL | 11 refills | Status: DC

## 2019-05-17 MED ORDER — FLUCONAZOLE 200 MG PO TABS: 200.0000 mg | ORAL_TABLET | Freq: Every day | ORAL | 0 refills | Status: DC

## 2019-05-17 NOTE — Progress Notes (Signed)
Subjective:    Patient ID: Levi [name], male    DOB: 1945-09-22, 73 y.o.   MRN: [id number]  HPI Levi [name] is a pleasant 73 year old white male, known to Dr. Carlean Purl.  He has history of atrial fibrillation, COPD, asthma, and is steroid-dependent.  Also with chronic GERD osteoarthritis and history of adenomatous colon polyps. Patient underwent EGD in October 2020 due to refractory reflux symptoms despite twice daily PPI.  He was noted to have patchy white plaques in the entire esophagus consistent with candidiasis and a benign distal esophageal stenosis which was traversed and not dilated.  He was started on a course of fluconazole x21 days. Esophageal biopsies came back negative for fungal organisms. Patient had recently been switched to omeprazole 40 mg p.o. twice daily and continues on that currently.  He says he felt better when he took the course of fluconazole and then within a couple of weeks symptoms recurred though are not as bad as he was initially.  He continues to complain of a burning type pain in the esophagus and lower throat, hoarseness and intermittent reflux postprandially. He had been given a course of Carafate in the past and is not sure that that made much difference. He has chronically been on Deltasone at 10 mg daily and has been using inhalers regularly as well.  Review of Systems Pertinent positive and negative review of systems were noted in the above HPI section.  All other review of systems was otherwise negative.  Outpatient Encounter Medications as of 05/17/2019  Medication Sig  . albuterol (PROVENTIL HFA;VENTOLIN HFA) 108 (90 Base) MCG/ACT inhaler TAKE 2 PUFFS BY MOUTH EVERY 4 HOURS AS NEEDED FOR WHEEZE  . allopurinol (ZYLOPRIM) 100 MG tablet Take 100 mg by mouth daily.    [name] aspirin EC 81 MG tablet Take 1 tablet (81 mg total) by mouth daily.  [name] atorvastatin (LIPITOR) 20 MG tablet TAKE 1 TABLET BY MOUTH EVERY DAY  . candesartan (ATACAND) 4 MG tablet TAKE 1/2 TABLETS (2  MG TOTAL) BY MOUTH DAILY.  [name] diltiazem (CARDIZEM CD) 180 MG 24 hr capsule TAKE 1 CAPSULE BY MOUTH EVERY DAY  . diltiazem (CARDIZEM) 30 MG tablet Take 1 tablet every 4 hours AS NEEDED for afib HR >100 and BP >100  . furosemide (LASIX) 20 MG tablet Take 1 tablet (20 mg total) by mouth daily as needed. For shortness of breath and lower limb edema  . ipratropium-albuterol (DUONEB) 0.5-2.5 (3) MG/3ML SOLN 1 NEBULE EVERY 6 HOURS AS NEEDED. **J45.3**  . isosorbide mononitrate (IMDUR) 30 MG 24 hr tablet TAKE 1 TABLET (30 MG TOTAL) BY MOUTH DAILY.  . Nebulizers (COMPRESSOR/NEBULIZER) MISC Use as directed  . omeprazole (PRILOSEC) 40 MG capsule Take 1 capsule (40 mg total) by mouth 2 (two) times daily before a meal.  . predniSONE (DELTASONE) 10 MG tablet Take 1 tablet (10 mg total) by mouth daily. continuous  . STRIVERDI RESPIMAT 2.5 MCG/ACT AERS INHALE 2 PUFFS BY MOUTH INTO THE LUNGS DAILY  . tamsulosin (FLOMAX) 0.4 MG CAPS capsule Take 0.4 mg daily with supper by mouth.   . [DISCONTINUED] omeprazole (PRILOSEC) 40 MG capsule Take 40 mg by mouth 2 (two) times daily before a meal.  . fluconazole (DIFLUCAN) 200 MG tablet Take 1 tablet (200 mg total) by mouth daily.  . [DISCONTINUED] beclomethasone (QVAR) 80 MCG/ACT inhaler Inhale 2 puffs into the lungs 2 (two) times daily.   No facility-administered encounter medications on file as of 05/17/2019.   Allergies  Allergen  Reactions  . Morphine And Related Palpitations and Other (See Comments)    Light headed and sweaty; "my heart races" (04/28/2012), pt states too much morphine gave him this reaction  . Levaquin [Levofloxacin] Itching  . Fluticasone-Salmeterol    Patient Active Problem List   Diagnosis Date Noted  . GERD (gastroesophageal reflux disease) 12/03/2017  . Pneumothorax, left 04/11/2017  . Lumbar stenosis with neurogenic claudication 04/07/2017  . Primary osteoarthritis of right hip 07/31/2015  . Primary osteoarthritis of right knee 06/17/2014   . Thrush of mouth and esophagus ([place]) 04/03/2014  . Palpitation 03/28/2014  . Pseudoarthrosis of lumbar spine 06/15/2013  . Hyperlipidemia   . History of colonic polyps 02/09/2013  . Atrial fibrillation ([place]) 05/11/2012  . Osteoarthritis of left knee 01/10/2012  . Seasonal and perennial allergic rhinitis 07/23/2010  . Asthma, moderate persistent 03/06/2009   Social History   Socioeconomic History  . Marital status: Married    Spouse name: Not on file  . Number of children: 1  . Years of education: Not on file  . Highest education level: Not on file  Occupational History  . 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 Smoking status: Never Smoker  . Smokeless tobacco: Never Used  Substance and Sexual Activity  . Alcohol use: No    Alcohol/week: 0.0 standard drinks  . Drug use: No  . Sexual activity: Yes  Other Topics Concern  . 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 Difficulty of Paying Living Expenses: Not on file  Food Insecurity:   . Worried About Charity fundraiser in the Last Year: Not on file  . Ran Out of Food in the Last Year: Not on file  Transportation Needs:   . Lack of Transportation (Medical): Not on file  . Lack of Transportation (Non-Medical): Not on file  Physical Activity:   . Days of Exercise per Week: Not on file  . Minutes of Exercise per Session: Not on file  Stress:   . Feeling of Stress : Not on file  Social Connections:   . Frequency of Communication with Friends and Family: Not on file  . Frequency of Social Gatherings with Friends and Family: Not on file  . Attends Religious Services: Not on file  . Active Member of Clubs or  Organizations: Not on file  . Attends Archivist Meetings: Not on file  . Marital Status: Not on file  Intimate Partner Violence:   . Fear of Current or Ex-Partner: Not on file  . Emotionally Abused: Not on file  . Physically Abused: Not on file  . Sexually Abused: Not on file    Mr. Levi [name] family history includes Diabetes in his mother; Heart disease in his father; Prostate cancer in his brother.      Objective:    Vitals:   05/17/19 0951  BP: 90/64  Pulse: 76  Temp: (!) 97.4 F (36.3 C)    Physical Exam Well-developed well-nourished older white male in no acute distress.   Weight, 160 BMI 20.5  HEENT; nontraumatic normocephalic, EOMI, PE RR LA, sclera anicteric Extremities; no clubbing cyanosis or edema skin warm and  dry Neuro/Psych; alert and oriented x4, grossly nonfocal mood and affect appropriate  Not further examined today       Assessment & Plan:   #19 73 year old white male, steroid-dependent with recent EGD consistent with candidiasis, in setting of chronic GERD. Patient improved with a course of fluconazole despite biopsies not showing any fungal organisms.  After completing fluconazole symptoms recurred within 2 to 3 weeks. I suspect he has recurrent or partially treated esophageal candidiasis  #2 history of adenomatous colon polyps-due for follow-up colonoscopy September 2021  #3 COPD 4.  Asthma 5.  Osteoarthritis  Plan; for now  will continue omeprazole 40 mg p.o. twice daily. Retreat with fluconazole 200 mg p.o. daily x21 days. Reviewed a strict antireflux regimen including elevation of the head of the bed, n.p.o. for 2 to 3 hours prior to bedtime, upright positioning postprandially and smaller more frequent meals. Stop Carafate  Patient is asked to call back if symptoms have not completely resolved and/or recur, for further advice.  Mona Ayars Genia Harold PA-C 05/17/2019   Cc: Orpah Melter, MD

## 2019-05-17 NOTE — Patient Instructions (Signed)
If you are age 72 or older, your body mass index should be between 23-30. Your Body mass index is 20.54 kg/m. If this is out of the aforementioned range listed, please consider follow up with your Primary Care Provider.  If you are age 21 or younger, your body mass index should be between 19-25. Your Body mass index is 20.54 kg/m. If this is out of the aformentioned range listed, please consider follow up with your Primary Care Provider.   Call and ask to speak to Dr Celesta Aver nurse if symptoms recur or do not resolve with treatment of Diflucan It was a pleasure to see you today!  Amy Esterwood, PA-C

## 2019-06-07 DIAGNOSIS — G5603 Carpal tunnel syndrome, bilateral upper limbs: Secondary | ICD-10-CM | POA: Diagnosis not present

## 2019-06-07 DIAGNOSIS — G5623 Lesion of ulnar nerve, bilateral upper limbs: Secondary | ICD-10-CM | POA: Diagnosis not present

## 2019-06-09 DIAGNOSIS — G5690 Unspecified mononeuropathy of unspecified upper limb: Secondary | ICD-10-CM | POA: Diagnosis not present

## 2019-06-09 DIAGNOSIS — G5603 Carpal tunnel syndrome, bilateral upper limbs: Secondary | ICD-10-CM | POA: Diagnosis not present

## 2019-06-17 DIAGNOSIS — B351 Tinea unguium: Secondary | ICD-10-CM | POA: Diagnosis not present

## 2019-06-17 DIAGNOSIS — M778 Other enthesopathies, not elsewhere classified: Secondary | ICD-10-CM | POA: Diagnosis not present

## 2019-06-21 DIAGNOSIS — G5621 Lesion of ulnar nerve, right upper limb: Secondary | ICD-10-CM | POA: Diagnosis not present

## 2019-06-21 DIAGNOSIS — G5603 Carpal tunnel syndrome, bilateral upper limbs: Secondary | ICD-10-CM | POA: Diagnosis not present

## 2019-06-21 DIAGNOSIS — G5601 Carpal tunnel syndrome, right upper limb: Secondary | ICD-10-CM | POA: Diagnosis not present

## 2019-07-08 DIAGNOSIS — G5622 Lesion of ulnar nerve, left upper limb: Secondary | ICD-10-CM | POA: Diagnosis not present

## 2019-07-08 DIAGNOSIS — G5602 Carpal tunnel syndrome, left upper limb: Secondary | ICD-10-CM | POA: Diagnosis not present

## 2019-07-08 DIAGNOSIS — G5692 Unspecified mononeuropathy of left upper limb: Secondary | ICD-10-CM | POA: Diagnosis not present

## 2019-07-08 DIAGNOSIS — G5603 Carpal tunnel syndrome, bilateral upper limbs: Secondary | ICD-10-CM | POA: Diagnosis not present

## 2019-07-15 ENCOUNTER — Other Ambulatory Visit: Payer: Self-pay

## 2019-07-15 ENCOUNTER — Encounter (HOSPITAL_COMMUNITY): Payer: Self-pay | Admitting: Pharmacy Technician

## 2019-07-15 ENCOUNTER — Emergency Department (HOSPITAL_COMMUNITY): Payer: Medicare Other

## 2019-07-15 ENCOUNTER — Emergency Department (HOSPITAL_COMMUNITY)
Admission: EM | Admit: 2019-07-15 | Discharge: 2019-07-15 | Disposition: A | Discharge status: Home / Self Care | Payer: Medicare Other | Attending: Emergency Medicine | Admitting: Emergency Medicine

## 2019-07-15 DIAGNOSIS — J45909 Unspecified asthma, uncomplicated: Secondary | ICD-10-CM | POA: Insufficient documentation

## 2019-07-15 DIAGNOSIS — S3993XA Unspecified injury of pelvis, initial encounter: Secondary | ICD-10-CM | POA: Diagnosis not present

## 2019-07-15 DIAGNOSIS — S72115A Nondisplaced fracture of greater trochanter of left femur, initial encounter for closed fracture: Secondary | ICD-10-CM | POA: Diagnosis not present

## 2019-07-15 DIAGNOSIS — Z7982 Long term (current) use of aspirin: Secondary | ICD-10-CM | POA: Diagnosis not present

## 2019-07-15 DIAGNOSIS — Y939 Activity, unspecified: Secondary | ICD-10-CM | POA: Diagnosis not present

## 2019-07-15 DIAGNOSIS — Y92512 Supermarket, store or market as the place of occurrence of the external cause: Secondary | ICD-10-CM | POA: Insufficient documentation

## 2019-07-15 DIAGNOSIS — J449 Chronic obstructive pulmonary disease, unspecified: Secondary | ICD-10-CM | POA: Diagnosis not present

## 2019-07-15 DIAGNOSIS — S72002A Fracture of unspecified part of neck of left femur, initial encounter for closed fracture: Secondary | ICD-10-CM

## 2019-07-15 DIAGNOSIS — S51012A Laceration without foreign body of left elbow, initial encounter: Secondary | ICD-10-CM | POA: Insufficient documentation

## 2019-07-15 DIAGNOSIS — Y999 Unspecified external cause status: Secondary | ICD-10-CM | POA: Insufficient documentation

## 2019-07-15 DIAGNOSIS — I480 Paroxysmal atrial fibrillation: Secondary | ICD-10-CM | POA: Diagnosis not present

## 2019-07-15 DIAGNOSIS — Z79899 Other long term (current) drug therapy: Secondary | ICD-10-CM | POA: Insufficient documentation

## 2019-07-15 DIAGNOSIS — S79912A Unspecified injury of left hip, initial encounter: Secondary | ICD-10-CM | POA: Diagnosis present

## 2019-07-15 DIAGNOSIS — W0110XA Fall on same level from slipping, tripping and stumbling with subsequent striking against unspecified object, initial encounter: Secondary | ICD-10-CM | POA: Diagnosis not present

## 2019-07-15 DIAGNOSIS — S41112A Laceration without foreign body of left upper arm, initial encounter: Secondary | ICD-10-CM | POA: Diagnosis not present

## 2019-07-15 DIAGNOSIS — S59902A Unspecified injury of left elbow, initial encounter: Secondary | ICD-10-CM | POA: Diagnosis not present

## 2019-07-15 DIAGNOSIS — W19XXXA Unspecified fall, initial encounter: Secondary | ICD-10-CM

## 2019-07-15 MED ORDER — OXYCODONE-ACETAMINOPHEN 5-325 MG PO TABS
1.0000 | ORAL_TABLET | Freq: Once | ORAL | Status: AC
  Administered 2019-07-15: 1 via ORAL
  Filled 2019-07-15: qty 1

## 2019-07-15 MED ORDER — HYDROCODONE-ACETAMINOPHEN 5-325 MG PO TABS: 1.0000 | ORAL_TABLET | ORAL | 0 refills | Status: DC | PRN

## 2019-07-15 NOTE — Discharge Instructions (Addendum)
Follow up with your orthopedic specialist next week. Take Norco as needed as prescribed for pain. This can cause constipation, take Colace as needed. Use a walker and weight bear as tolerated.

## 2019-07-15 NOTE — ED Triage Notes (Signed)
Pt arrives pov with reports of mechanical fall. Pt endorses hitting his head. Denies LOC. Pt with L hip pain.

## 2019-07-15 NOTE — ED Provider Notes (Signed)
[facility] EMERGENCY DEPARTMENT Provider Note   CSN: [id number] Arrival date & time: 07/15/19  1403     History Chief Complaint  Patient presents with  . Fall  . Hip Pain    Levi [name] is a 74 y.o. male.  74 year old male presents with complaint of left hip pain after a fall today.  Patient states that he tripped at the grocery store and fell landing on his left side.  Patient states he did hit the left side of his head, did not lose consciousness, is not anticoagulated.  Patient's primary complaint is pain in his left hip laterally, also has a skin tear with slight pain in his left elbow.  Patient denies feeling weak, dizzy, lightheaded prior to the fall.  Patient has been ambulatory with pain in the left hip since the fall.  Patient reports prior hip replacements with Dr. Berenice Primas.  No other injuries, complaints, concerns today.        Past Medical History:  Diagnosis Date  . Agent orange exposure 1970's   takes Imdur,Diltiazem, and Atacand daily  . ALLERGIC RHINITIS   . Asthma   . Bruises easily    d/t prednisone daily  . Chronic bronchitis ([place])    "used to get it q yr; last time ?2008" (04/28/2012)  . COPD (chronic obstructive pulmonary disease) (H[facility])    agent orange exposure  . Degenerative disk disease    "qwhere" (04/28/2012)  . Diverticulitis   . Dysrhythmia    afib  . Elevated uric acid in blood    takes Allopurinol daily  . Enlarged prostate    but not on any meds  . GERD (gastroesophageal reflux disease)    takes Protonix daily  . H/O hiatal hernia   . Headache   . History of pneumonia    a. 2010  . Hyperlipidemia    takes Lipitor daily; pt. states he takes a preventive  . Joint pain   . Joint swelling   . PAF (paroxysmal atrial fibrillation) ([place])    a. Dx 04/2012, CHA2DS2VASc = 1 (age);  b. 04/2012 Echo: EF 40-50%, mild MR.  . Personal history of colonic adenomas 02/09/2013  . Shortness of breath dyspnea     Patient Active  Problem List   Diagnosis Date Noted  . GERD (gastroesophageal reflux disease) 12/03/2017  . Pneumothorax, left 04/11/2017  . Lumbar stenosis with neurogenic claudication 04/07/2017  . Primary osteoarthritis of right hip 07/31/2015  . Primary osteoarthritis of right knee 06/17/2014  . Thrush of mouth and esophagus ([place]) 04/03/2014  . Palpitation 03/28/2014  . Pseudoarthrosis of lumbar spine 06/15/2013  . Hyperlipidemia   . History of colonic polyps 02/09/2013  . Atrial fibrillation ([place]) 05/11/2012  . Osteoarthritis of left knee 01/10/2012  . Seasonal and perennial allergic rhinitis 07/23/2010  . Asthma, moderate persistent 03/06/2009    Past Surgical History:  Procedure Laterality Date  . ANTERIOR CERVICAL DECOMP/DISCECTOMY FUSION N/A 07/21/2012   Procedure: ANTERIOR CERVICAL DECOMPRESSION/DISCECTOMY FUSION 2 LEVELS;  Surgeon: [name], MD;  Location: [facility];  Service: Neurosurgery;  Laterality: N/A;  C4-5 C5-6 Anterior cervical decompression/diskectomy/fusion  . ANTERIOR LAT LUMBAR FUSION N/A 04/07/2017   Procedure: Thoracic twelve-Lumbar one Anterolateral decompression/fusion;  Surgeon: [name], MD;  Location: [facility];  Service: Neurosurgery;  Laterality: N/A;  . BACK SURGERY     x 3  . CARDIAC CATHETERIZATION  11/2008   [name] - 20% calcified non flow limiting left main, 50% EF apical  hypokinesis  . CERVICAL DISC ARTHROPLASTY N/A 04/07/2017   Procedure: Cervical six-seven Disc arthroplasty;  Surgeon: [name], MD;  Location: [facility];  Service: Neurosurgery;  Laterality: N/A;  . CHEST TUBE INSERTION Left 04/11/2017   Procedure: CHEST TUBE INSERTION;  Surgeon: [name], MD;  Location: [facility];  Service: Thoracic;  Laterality: Left;  . CHOLECYSTECTOMY    . COLONOSCOPY    . ESOPHAGOGASTRODUODENOSCOPY    . EYE SURGERY Bilateral 2011   steroidal encapsulation" (04/28/2012)  . FOOT SURGERY Right    x 2  . KNEE ARTHROSCOPY Bilateral   . LATERAL / POSTERIOR  COMBINED FUSION LUMBAR SPINE  2012  . REVERSE SHOULDER ARTHROPLASTY  04/28/2012   Procedure: REVERSE SHOULDER ARTHROPLASTY;  Surgeon: [name], MD;  Location: [facility];  Service: Orthopedics;  Laterality: Left;  Left shouder reverse total shoulder arthroplasty  . SHOULDER SURGERY    . [place]  . TOTAL HIP ARTHROPLASTY Left 2011   "left" (04/28/2012)  . TOTAL HIP ARTHROPLASTY Right 07/31/2015   Procedure: TOTAL HIP ARTHROPLASTY ANTERIOR APPROACH;  Surgeon: [name], MD;  Location: [facility];  Service: Orthopedics;  Laterality: Right;  . TOTAL KNEE ARTHROPLASTY  01/10/2012   Procedure: TOTAL KNEE ARTHROPLASTY;  Surgeon: [name], MD;  Location: [facility];  Service: Orthopedics;;  left total knee arthroplasty  . TOTAL KNEE ARTHROPLASTY Right 06/17/2014   Procedure: TOTAL KNEE ARTHROPLASTY;  Surgeon: [name], MD;  Location: [facility];  Service: Orthopedics;  Laterality: Right;       Family History  Problem Relation Age of Onset  . Heart disease Father        Died 74, MI  . Diabetes Mother   . Prostate cancer Brother   . Colon cancer Neg Hx   . Esophageal cancer Neg Hx   . Rectal cancer Neg Hx   . Stomach cancer Neg Hx   . Pancreatic cancer Neg Hx   . Kidney disease Neg Hx   . Liver disease Neg Hx     Social History   Tobacco Use  . Smoking status: Never Smoker  . Smokeless tobacco: Never Used  Substance Use Topics  . Alcohol use: No    Alcohol/week: 0.0 standard drinks  . Drug use: No    Home Medications Prior to Admission medications   Medication Sig Start Date End Date Taking? Authorizing Provider  albuterol (PROVENTIL HFA;VENTOLIN HFA) 108 (90 Base) MCG/ACT inhaler TAKE 2 PUFFS BY MOUTH EVERY 4 HOURS AS NEEDED FOR WHEEZE Patient taking differently: Inhale 2 puffs into the lungs every 4 (four) hours as needed for wheezing or shortness of breath.  06/10/18  Yes Young, Tarri Fuller D, MD  allopurinol (ZYLOPRIM) 100 MG tablet Take 100 mg by  mouth daily.     Yes [provider]  aspirin EC 81 MG tablet Take 1 tablet (81 mg total) by mouth daily. 10/10/15  Yes Jerline Pain, MD  atorvastatin (LIPITOR) 20 MG tablet TAKE 1 TABLET BY MOUTH EVERY DAY Patient taking differently: Take 20 mg by mouth daily.  03/16/19  Yes Jerline Pain, MD  candesartan (ATACAND) 4 MG tablet TAKE 1/2 TABLETS (2 MG TOTAL) BY MOUTH DAILY. Patient taking differently: Take 2 mg by mouth at bedtime.  05/06/19  Yes Jerline Pain, MD  cyclobenzaprine (FLEXERIL) 10 MG tablet Take 10 mg by mouth at bedtime as needed for muscle spasms.   Yes [provider]  diltiazem (CARDIZEM CD) 180  MG 24 hr capsule TAKE 1 CAPSULE BY MOUTH EVERY DAY Patient taking differently: Take 180 mg by mouth daily.  11/02/18  Yes Jerline Pain, MD  diltiazem (CARDIZEM) 30 MG tablet Take 1 tablet every 4 hours AS NEEDED for afib HR >100 and BP >100 Patient taking differently: Take 30 mg by mouth every 4 (four) hours as needed ("for afib HR >100 and BP >100").  04/29/18  Yes Burtis Junes, NP  furosemide (LASIX) 20 MG tablet Take 1 tablet (20 mg total) by mouth daily as needed. For shortness of breath and lower limb edema Patient taking differently: Take 20 mg by mouth daily as needed (for shortness of breath and lower limb edema).  11/17/18  Yes Jerline Pain, MD  ipratropium-albuterol (DUONEB) 0.5-2.5 (3) MG/3ML SOLN 1 NEBULE EVERY 6 HOURS AS NEEDED. **J45.3** Patient taking differently: Take 3 mLs by nebulization every 6 (six) hours as needed (for shortness of breath or wheezing- DX. CODE J45.3).  09/07/18  Yes Young, Tarri Fuller D, MD  isosorbide mononitrate (IMDUR) 30 MG 24 hr tablet TAKE 1 TABLET (30 MG TOTAL) BY MOUTH DAILY. Patient taking differently: Take 30 mg by mouth daily.  02/16/19  Yes Jerline Pain, MD  Nebulizers (COMPRESSOR/NEBULIZER) MISC Use as directed 12/01/17  Yes Baird Lyons D, MD  omeprazole (PRILOSEC) 40 MG capsule Take 1 capsule (40 mg total) by mouth 2  (two) times daily before a meal. 05/17/19  Yes Esterwood, Amy S, PA-C  predniSONE (DELTASONE) 10 MG tablet Take 1 tablet (10 mg total) by mouth daily. continuous Patient taking differently: Take 10 mg by mouth daily.  04/28/19  Yes Young, Tarri Fuller D, MD  STRIVERDI RESPIMAT 2.5 MCG/ACT AERS INHALE 2 PUFFS BY MOUTH INTO THE LUNGS DAILY Patient taking differently: Inhale 2 puffs into the lungs daily.  05/03/19  Yes Young, Tarri Fuller D, MD  tamsulosin (FLOMAX) 0.4 MG CAPS capsule Take 0.4 mg by mouth at bedtime.  10/13/15  Yes [provider]  fluconazole (DIFLUCAN) 200 MG tablet Take 1 tablet (200 mg total) by mouth daily. Patient not taking: Reported on 07/15/2019 05/17/19   Esterwood, Amy S, PA-C  HYDROcodone-acetaminophen (NORCO/VICODIN) 5-325 MG tablet Take 1 tablet by mouth every 4 (four) hours as needed. 07/15/19   Tacy Learn, PA-C    Allergies    Morphine and related, Levaquin [levofloxacin], and Fluticasone-salmeterol  Review of Systems   Review of Systems  Constitutional: Negative for chills and fever.  Eyes: Negative for visual disturbance.  Respiratory: Negative for shortness of breath.   Cardiovascular: Negative for chest pain.  Gastrointestinal: Negative for abdominal pain.  Musculoskeletal: Positive for arthralgias and gait problem. Negative for back pain, joint swelling, neck pain and neck stiffness.  Skin: Positive for wound.  Allergic/Immunologic: Negative for immunocompromised state.  Neurological: Negative for dizziness, speech difficulty, weakness, light-headedness and headaches.  Hematological: Does not bruise/bleed easily.  Psychiatric/Behavioral: Negative for confusion.  All other systems reviewed and are negative.   Physical Exam Updated Vital Signs BP 118/75   Pulse (!) 59   Temp (!) 97.2 F (36.2 C) (Oral)   Resp 16   Ht 6\' 2"  (1.88 m)   Wt 73.5 kg   SpO2 100%   BMI 20.80 kg/m   Physical Exam Vitals and nursing note reviewed.  Constitutional:       General: He is not in acute distress.    Appearance: He is well-developed. He is not diaphoretic.  HENT:     Head: Normocephalic  and atraumatic.   Eyes:     Extraocular Movements: Extraocular movements intact.     Pupils: Pupils are equal, round, and reactive to light.  Cardiovascular:     Rate and Rhythm: Normal rate and regular rhythm.     Pulses: Normal pulses.     Heart sounds: Normal heart sounds.  Pulmonary:     Effort: Pulmonary effort is normal.     Breath sounds: Normal breath sounds.  Abdominal:     Palpations: Abdomen is soft.     Tenderness: There is no abdominal tenderness.  Musculoskeletal:        General: Tenderness and signs of injury present. No swelling or deformity.     Left elbow: No swelling, deformity or effusion. Tenderness present.       Arms:     Cervical back: Normal. No tenderness, bony tenderness or crepitus. Normal range of motion.     Right lower leg: No edema.     Left lower leg: No edema.       Legs:  Skin:    General: Skin is warm and dry.     Findings: No erythema or rash.  Neurological:     Mental Status: He is alert and oriented to person, place, and time.  Psychiatric:        Behavior: Behavior normal.     ED Results / Procedures / Treatments   Labs (all labs ordered are listed, but only abnormal results are displayed) Labs Reviewed - No data to display  EKG None  Radiology DG Elbow Complete Left  Result Date: 07/15/2019 CLINICAL DATA:  Left elbow pain after a fall today. Initial encounter. EXAM: LEFT ELBOW - COMPLETE 3+ VIEW COMPARISON:  None. FINDINGS: There is no evidence of fracture, dislocation, or joint effusion. There is no evidence of arthropathy or other focal bone abnormality. Soft tissues are unremarkable. IMPRESSION: Negative exam. Electronically Signed   By: Inge Rise M.D.   On: 07/15/2019 14:55   CT PELVIS WO CONTRAST  Result Date: 07/15/2019 CLINICAL DATA:  Pelvic trauma. EXAM: CT PELVIS WITHOUT  CONTRAST TECHNIQUE: Multidetector CT imaging of the pelvis was performed following the standard protocol without intravenous contrast. COMPARISON:  Earlier today FINDINGS: Urinary Tract: No abnormality visualized.Partially visualized left lower pole kidney cyst noted, incompletely characterized without IV contrast Bowel: Extensive sigmoid diverticulosis. No bowel wall thickening, inflammation or distension. The appendix is visualized and appears normal. Vascular/Lymphatic: Aortic atherosclerosis. No aneurysm no abdominopelvic adenopathy identified. Reproductive:  Prostate gland unremarkable. Other:  No free fluid or fluid collections. Musculoskeletal: Previous posterior decompression and hardware fixation of the lower lumbar spine and sacrum. Right hip arthroplasty scratch right total hip arthroplasty device is again noted. The there is no dislocation or evidence of periprosthetic fracture. Left hip arthroplasty device is again identified. Chronic heterotopic bone formation is identified surrounding the proximal left femur and hip. There is a acute fracture deformity which involves the greater trochanter. There is mild lateral and anterior displacement of the fracture fragments. No dislocation. IMPRESSION: Status post left total hip arthroplasty with acute periprosthetic fracture of the greater trochanter. There is mild lateral and anterior displacement of the greater trochanter No additional fractures identified. Aortic Atherosclerosis (ICD10-I70.0). Electronically Signed   By: Kerby Moors M.D.   On: 07/15/2019 18:23   DG Hip Unilat With Pelvis 2-3 Views Left  Result Date: 07/15/2019 CLINICAL DATA:  Left hip pain. Status post fall today. Initial encounter. EXAM: DG HIP (WITH OR WITHOUT PELVIS) 2-3V LEFT COMPARISON:  Plain films left hip 02/21/2010. FINDINGS: Bilateral hip replacements are identified. There is an acute, nondisplaced fracture of the left greater trochanter. No other acute abnormality is  identified. Heterotopic ossification about the left hip is noted. The patient is status post lower lumbar fusion. Atherosclerosis is seen. IMPRESSION: Acute nondisplaced fracture of the left greater trochanter with a left hip arthroplasty in place. Electronically Signed   By: Inge Rise M.D.   On: 07/15/2019 14:53    Procedures Procedures (including critical care time)  Medications Ordered in ED Medications  oxyCODONE-acetaminophen (PERCOCET/ROXICET) 5-325 MG per tablet 1 tablet (1 tablet Oral Given 07/15/19 1520)    ED Course  I have reviewed the triage vital signs and the nursing notes.  Pertinent labs & imaging results that were available during my care of the patient were reviewed by me and considered in my medical decision making (see chart for details).  Clinical Course as of Jul 14 2038  Thu Jul 14, 5145  42 74 year old male presents with complaint of pain in the left hip after a mechanical fall today, did hit head, no loss of consciousness, not anticoagulated.  On exam has a small area of soft tissue swelling and tenderness to the left scalp without crepitus.  Patient has a minor skin tear to the left elbow which was cleaned and dressed with a Tegaderm.  X-ray of the left elbow is unremarkable.  X-ray of the left hip shows a greater trochanter fracture status post left hip replacement.  Case discussed with Dr. [name] on-call for [facility] orthopedics (patient sees Dr. Shirlee Limerick).  Dr. [name] requests CT, CT is negative for further fracture.  Dr. [name] requests patient to follow up in the office with Dr. Berenice Primas next week, dc home with pain medications, patient has a walker at home with main floor living and is agreeable with dc home at this time.    [LM]    Clinical Course User Index [LM] Roque Lias   MDM Rules/Calculators/A&P                      Final Clinical Impression(s) / ED Diagnoses Final diagnoses:  Fall, initial encounter  Closed fracture of left hip, initial  encounter [facility])  Skin tear of left upper arm without complication, initial encounter    Rx / DC Orders ED Discharge Orders         Ordered    HYDROcodone-acetaminophen (NORCO/VICODIN) 5-325 MG tablet  Every 4 hours PRN     07/15/19 1819           Tacy Learn, PA-C 07/15/19 2040    Virgel Manifold, MD 07/20/19 [phone]

## 2019-07-19 DIAGNOSIS — S72115A Nondisplaced fracture of greater trochanter of left femur, initial encounter for closed fracture: Secondary | ICD-10-CM | POA: Diagnosis not present

## 2019-07-19 DIAGNOSIS — M25552 Pain in left hip: Secondary | ICD-10-CM | POA: Diagnosis not present

## 2019-07-19 DIAGNOSIS — Z96642 Presence of left artificial hip joint: Secondary | ICD-10-CM | POA: Diagnosis not present

## 2019-08-02 DIAGNOSIS — M25562 Pain in left knee: Secondary | ICD-10-CM | POA: Diagnosis not present

## 2019-08-02 DIAGNOSIS — M25552 Pain in left hip: Secondary | ICD-10-CM | POA: Diagnosis not present

## 2019-08-12 DIAGNOSIS — M7752 Other enthesopathy of left foot: Secondary | ICD-10-CM | POA: Diagnosis not present

## 2019-08-12 DIAGNOSIS — M7751 Other enthesopathy of right foot: Secondary | ICD-10-CM | POA: Diagnosis not present

## 2019-08-12 DIAGNOSIS — M24573 Contracture, unspecified ankle: Secondary | ICD-10-CM | POA: Diagnosis not present

## 2019-08-23 DIAGNOSIS — M79671 Pain in right foot: Secondary | ICD-10-CM | POA: Diagnosis not present

## 2019-08-23 DIAGNOSIS — M25552 Pain in left hip: Secondary | ICD-10-CM | POA: Diagnosis not present

## 2019-09-03 DIAGNOSIS — R58 Hemorrhage, not elsewhere classified: Secondary | ICD-10-CM | POA: Diagnosis not present

## 2019-09-15 ENCOUNTER — Telehealth: Payer: Self-pay | Admitting: Internal Medicine

## 2019-09-15 MED ORDER — AMOXICILLIN-POT CLAVULANATE 875-125 MG PO TABS: 1.0000 | ORAL_TABLET | Freq: Two times a day (BID) | ORAL | 0 refills | Status: DC

## 2019-09-15 NOTE — Progress Notes (Signed)
CARDIOLOGY OFFICE NOTE  Date:  09/28/2019    Levi [name] Date of Birth: [birth year] Medical Record [id number]  PCP:  Orpah Melter, MD  Cardiologist:  Marisa Cyphers  Chief Complaint  Patient presents with  . Follow-up    Seen for [name]    History of Present Illness: Levi [name] is a 74 y.o. male who presents today for a 6 month check. Seen for [name]. Has primarily followed with me.    He has a history of PAFfirst notedin December of 2013then again in 2016. He is on low dose CCB therapy and low dose aspirin.Echocardiogram demonstrated EF in the 50% range. Prior catheterization in 2010 showed no flow-limiting CAD.Other issues include COPD, spine surgery, occasional palpitation.He has had agent orange exposure.He had a prior lumbar fusion in [id number] complicated by PTX requiring chest tube placement. He has had CHADSVASC of 1 - has wanted to wait until over 76 to start anticoagulation if needed.I saw him back in December of 2019. He had had his echo updated - EF 40 to 45%. No beta blocker due to COPD. Intermittent swelling - felt to be due to CCB therapy. Using Lasix prn. Last seen by [name] in January of 2020. Cardiac status ok - had had a bout of ?vertigo and saw neurology with MRI.Remote PAF from 2013 noted.   I saw him in June for a telehealth visit. - he was using more Lasix for lower extremity edema. Had seen pulmonary who was concerned about his diuretic use and low BP. He was getting ready to have PFTs.CXR was done.Was not dizzy. Breathing probably a little worse.We sent him for labshere- BNP totally normal.Then seen in the office here in July - he was doing better - swelling had improved - using less Lasix. Very little AF per his report. Last seen back in November and was doing ok - using very little Lasix. More back/neck issues. Weight was down a few pounds.   The patient does not have symptoms concerning for COVID-19 infection (fever,  chills, cough, or new shortness of breath).   Comes in today. Here alone. He has not been doing as well. He has had progressive peripheral neuropathy - to the point that he has been falling - broke his hip in February along with a knee fracture after falling in the parking lot at [employer].  Continues to fall. [place] this morning - "feels like he lifted his feet but I did not". Has had nerve conduction studies yesterday - waiting on those results. He is back exercising. Feels like his heart is ok. Has had some swelling in his legs. It ranges in how often he needs to use his prn Lasix. He tries to watch his salt. His legs burn from the neuropathy. He is on chronic Prednisone. No palpitations that is bothersome to him. He has chronic DOE - this is stable. He feels like his heart is doing ok.   Past Medical History:  Diagnosis Date  . Agent orange exposure 1970's   takes Imdur,Diltiazem, and Atacand daily  . ALLERGIC RHINITIS   . Asthma   . Bruises easily    d/t prednisone daily  . Chronic bronchitis ([place])    "used to get it q yr; last time ?2008" (04/28/2012)  . COPD (chronic obstructive pulmonary disease) ([facility])    agent orange exposure  . Degenerative disk disease    "qwhere" (04/28/2012)  . Diverticulitis   . Dysrhythmia  afib  . Elevated uric acid in blood    takes Allopurinol daily  . Enlarged prostate    but not on any meds  . GERD (gastroesophageal reflux disease)    takes Protonix daily  . H/O hiatal hernia   . Headache   . History of pneumonia    a. 2010  . Hyperlipidemia    takes Lipitor daily; pt. states he takes a preventive  . Joint pain   . Joint swelling   . PAF (paroxysmal atrial fibrillation) ([place])    a. Dx 04/2012, CHA2DS2VASc = 1 (age);  b. 04/2012 Echo: EF 40-50%, mild MR.  . Personal history of colonic adenomas 02/09/2013  . Shortness of breath dyspnea     Past Surgical History:  Procedure Laterality Date  . ANTERIOR CERVICAL DECOMP/DISCECTOMY FUSION N/A  07/21/2012   Procedure: ANTERIOR CERVICAL DECOMPRESSION/DISCECTOMY FUSION 2 LEVELS;  Surgeon: [name], MD;  Location: [facility];  Service: Neurosurgery;  Laterality: N/A;  C4-5 C5-6 Anterior cervical decompression/diskectomy/fusion  . ANTERIOR LAT LUMBAR FUSION N/A 04/07/2017   Procedure: Thoracic twelve-Lumbar one Anterolateral decompression/fusion;  Surgeon: [name], MD;  Location: [facility];  Service: Neurosurgery;  Laterality: N/A;  . BACK SURGERY     x 3  . CARDIAC CATHETERIZATION  11/2008   [name] - 20% calcified non flow limiting left main, 50% EF apical hypokinesis  . CERVICAL DISC ARTHROPLASTY N/A 04/07/2017   Procedure: Cervical six-seven Disc arthroplasty;  Surgeon: [name], MD;  Location: [facility];  Service: Neurosurgery;  Laterality: N/A;  . CHEST TUBE INSERTION Left 04/11/2017   Procedure: CHEST TUBE INSERTION;  Surgeon: [name], MD;  Location: [facility];  Service: Thoracic;  Laterality: Left;  . CHOLECYSTECTOMY    . COLONOSCOPY    . ESOPHAGOGASTRODUODENOSCOPY    . EYE SURGERY Bilateral 2011   steroidal encapsulation" (04/28/2012)  . FOOT SURGERY Right    x 2  . KNEE ARTHROSCOPY Bilateral   . LATERAL / POSTERIOR COMBINED FUSION LUMBAR SPINE  2012  . REVERSE SHOULDER ARTHROPLASTY  04/28/2012   Procedure: REVERSE SHOULDER ARTHROPLASTY;  Surgeon: [name], MD;  Location: [facility];  Service: Orthopedics;  Laterality: Left;  Left shouder reverse total shoulder arthroplasty  . SHOULDER SURGERY    . [place]  . TOTAL HIP ARTHROPLASTY Left 2011   "left" (04/28/2012)  . TOTAL HIP ARTHROPLASTY Right 07/31/2015   Procedure: TOTAL HIP ARTHROPLASTY ANTERIOR APPROACH;  Surgeon: [name], MD;  Location: [facility];  Service: Orthopedics;  Laterality: Right;  . TOTAL KNEE ARTHROPLASTY  01/10/2012   Procedure: TOTAL KNEE ARTHROPLASTY;  Surgeon: [name], MD;  Location: [facility];  Service: Orthopedics;;  left total knee arthroplasty  .  TOTAL KNEE ARTHROPLASTY Right 06/17/2014   Procedure: TOTAL KNEE ARTHROPLASTY;  Surgeon: [name], MD;  Location: [facility];  Service: Orthopedics;  Laterality: Right;     Medications: Current Meds  Medication Sig  . albuterol (VENTOLIN HFA) 108 (90 Base) MCG/ACT inhaler Inhale 2 puffs into the lungs every 4 (four) hours as needed for wheezing or shortness of breath.  . allopurinol (ZYLOPRIM) 100 MG tablet Take 100 mg by mouth daily.    [name] aspirin EC 81 MG tablet Take 1 tablet (81 mg total) by mouth daily.  [name] atorvastatin (LIPITOR) 20 MG tablet TAKE 1 TABLET BY MOUTH EVERY DAY  . candesartan (ATACAND) 4 MG tablet TAKE 1/2 TABLETS (2 MG TOTAL) BY MOUTH DAILY.  . cyclobenzaprine (  FLEXERIL) 10 MG tablet Take 10 mg by mouth at bedtime as needed for muscle spasms.  [name] diltiazem (CARDIZEM CD) 180 MG 24 hr capsule TAKE 1 CAPSULE BY MOUTH EVERY DAY  . furosemide (LASIX) 20 MG tablet Take 1 tablet (20 mg total) by mouth daily as needed. For shortness of breath and lower limb edema  . ipratropium-albuterol (DUONEB) 0.5-2.5 (3) MG/3ML SOLN 1 NEBULE EVERY 6 HOURS AS NEEDED. **J45.3**  . isosorbide mononitrate (IMDUR) 30 MG 24 hr tablet TAKE 1 TABLET (30 MG TOTAL) BY MOUTH DAILY.  . Nebulizers (COMPRESSOR/NEBULIZER) MISC Use as directed  . omeprazole (PRILOSEC) 40 MG capsule Take 1 capsule (40 mg total) by mouth 2 (two) times daily before a meal.  . predniSONE (DELTASONE) 10 MG tablet Take 1 tablet (10 mg total) by mouth daily. continuous  . STRIVERDI RESPIMAT 2.5 MCG/ACT AERS INHALE 2 PUFFS BY MOUTH INTO THE LUNGS DAILY  . tamsulosin (FLOMAX) 0.4 MG CAPS capsule Take 0.4 mg by mouth at bedtime.   . [DISCONTINUED] diltiazem (CARDIZEM CD) 180 MG 24 hr capsule TAKE 1 CAPSULE BY MOUTH EVERY DAY     Allergies: Allergies  Allergen Reactions  . Morphine And Related Palpitations and Other (See Comments)    Made patient light-headed and sweaty; "my heart races" (04/28/2012), pt states too much morphine gave  him this reaction  . Levaquin [Levofloxacin] Itching  . Fluticasone-Salmeterol Other (See Comments)    Patient somewhat disputes this (??)    Social History: The patient  reports that he has never smoked. He has never used smokeless tobacco. He reports that he does not drink alcohol or use drugs.   Family History: The patient's family history includes Diabetes in his mother; Heart disease in his father; Prostate cancer in his brother.   Review of Systems: Please see the history of present illness.   All other systems are reviewed and negative.   Physical Exam: VS:  BP 128/72   Pulse 81   Ht 6\' 2"  (1.88 m)   Wt 158 lb 12.8 oz (72 kg)   SpO2 98%   BMI 20.39 kg/m  .  BMI Body mass index is 20.39 kg/m.  Wt Readings from Last 3 Encounters:  09/28/19 158 lb 12.8 oz (72 kg)  09/21/19 161 lb (73 kg)  07/15/19 162 lb (73.5 kg)    General: Pleasant. Alert and in no acute distress. His weight is down from 159# from our last visit.  Remains thin.  HEENT: Normal.  Neck: Supple, no JVD, carotid bruits, or masses noted.  Cardiac: Regular rate and rhythm. No murmurs, rubs, or gallops. No edema.  Respiratory:  Lungs are clear to auscultation bilaterally with normal work of breathing.  GI: Soft and nontender.  MS: No deformity or atrophy. Gait and ROM intact.  Skin: Warm and dry. Color is normal.  Neuro:  Strength and sensation are intact and no gross focal deficits noted.  Psych: Alert, appropriate and with normal affect.   LABORATORY DATA:  EKG:  EKG is not ordered today.    Lab Results  Component Value Date   WBC 9.2 10/30/2018   HGB 14.6 10/30/2018   HCT 43.4 10/30/2018   PLT 170 10/30/2018   GLUCOSE 108 (H) 10/30/2018   CHOL 193 04/29/2018   TRIG 84 04/29/2018   HDL 109 04/29/2018   LDLCALC 67 04/29/2018   ALT 29 04/29/2018   AST 32 04/29/2018   NA 146 (H) 10/30/2018   K 3.8 10/30/2018   CL  106 10/30/2018   CREATININE 1.18 10/30/2018   BUN 23 10/30/2018   CO2 25  10/30/2018   TSH 1.979 05/10/2012   INR 0.98 07/25/2015     BNP (last 3 results) No results for input(s): BNP in the last 8760 hours.  ProBNP (last 3 results) Recent Labs    10/30/18 0945  PROBNP 33     Other Studies Reviewed Today:  CXR 09/2018 IMPRESSION: 1. No acute cardiopulmonary disease. 2. Stable hyperexpansion. 3. Postsurgical changes  EchoStudy Conclusions6/2019  - Left ventricle: The cavity size was normal. Wall thickness was normal. Systolic function was mildly to moderately reduced. The estimated ejection fraction was in the range of 40% to 45%. Diffuse hypokinesis. Features are consistent with a pseudonormal left ventricular filling pattern, with concomitant abnormal relaxation and increased filling pressure (grade 2 diastolic dysfunction). - Right atrium: The atrium was mildly dilated.   ASSESSMENT & PLAN:   1. Progressive peripheral neuropathy - with associated falls - recent hip fracture - plan per neurosurgery.   2. PAF - remains in sinus by exam. No documented recurrence since 2016 - has opted for just aspirin until 76 - but now with these falls - anticoagulation would be challenging.   3. COPD - followed by pulmonary - seems stable.   4. HLD - on Lipitor - needs labs today.   5. Intermittent swelling - on chronic CCB and prednisone - uses prn Lasix - recheck lab today.   6. Mild LV dysfunction - not on ideal therapy due to chronically soft BP and his COPD that limits use of beta blocker. Swelling and weight is stable. Last echo stable. We have opted to continue with medical therapy.   7. COVID-19 Education: The signs and symptoms of COVID-19 were discussed with the patient and how to seek care for testing (follow up with PCP or arrange E-visit).  The importance of social distancing, staying at home, hand hygiene and wearing a mask when out in public were discussed today. He has been vaccinated.   Current medicines are  reviewed with the patient today.  The patient does not have concerns regarding medicines other than what has been noted above.  The following changes have been made:  See above.  Labs/ tests ordered today include:    Orders Placed This Encounter  Procedures  . Basic metabolic panel  . CBC  . Hepatic function panel  . Lipid panel  . TSH     Disposition:   FU with me in 6 months. Lab today.    Patient is agreeable to this plan and will call if any problems develop in the interim.   SignedTruitt Merle, NP  09/28/2019 10:14 AM  [facility] [address] [place], [place]  [address] Phone: [phone] Fax: [phone]

## 2019-09-15 NOTE — Telephone Encounter (Signed)
lmtcb for pt.  

## 2019-09-15 NOTE — Telephone Encounter (Signed)
Pt called back-- please return call anytime this afternoon

## 2019-09-15 NOTE — Telephone Encounter (Signed)
Spoke with patient. He is aware that Dr. Annamaria Boots has called in the Augmentin for him.   Nothing further needed at time of call.

## 2019-09-15 NOTE — Telephone Encounter (Signed)
Spoke with pt. States that he is not feeling well. Reports coughing, shortness of breath and chest congestion. Cough is producing brown-green mucus. Denies fever/chills, sore throat, severe headache, joint pain, unexplained muscle aches, loss of taste or smell, rash, N/V/D, abdominal pain, redness around/in the eye, increased weakness or unexplained bruising or bleeding.  Pt states that he gets this "infection" every year and would like CY to send in Augmentin.  CY - please advise. Thanks.

## 2019-09-15 NOTE — Telephone Encounter (Signed)
augmentin script e-sent

## 2019-09-17 DIAGNOSIS — L301 Dyshidrosis [pompholyx]: Secondary | ICD-10-CM | POA: Diagnosis not present

## 2019-09-20 DIAGNOSIS — M79672 Pain in left foot: Secondary | ICD-10-CM | POA: Diagnosis not present

## 2019-09-21 ENCOUNTER — Other Ambulatory Visit: Payer: Self-pay

## 2019-09-21 ENCOUNTER — Ambulatory Visit (INDEPENDENT_AMBULATORY_CARE_PROVIDER_SITE_OTHER): Payer: Medicare Other | Admitting: Internal Medicine

## 2019-09-21 ENCOUNTER — Encounter: Payer: Self-pay | Admitting: Internal Medicine

## 2019-09-21 ENCOUNTER — Ambulatory Visit (INDEPENDENT_AMBULATORY_CARE_PROVIDER_SITE_OTHER): Payer: Medicare Other

## 2019-09-21 VITALS — BP 116/74 | HR 63 | Temp 97.3°F | Ht 74.0 in | Wt 161.0 lb

## 2019-09-21 DIAGNOSIS — R0789 Other chest pain: Secondary | ICD-10-CM | POA: Insufficient documentation

## 2019-09-21 DIAGNOSIS — J4541 Moderate persistent asthma with (acute) exacerbation: Secondary | ICD-10-CM | POA: Diagnosis not present

## 2019-09-21 DIAGNOSIS — M25512 Pain in left shoulder: Secondary | ICD-10-CM | POA: Diagnosis not present

## 2019-09-21 HISTORY — DX: Other chest pain: R07.89

## 2019-09-21 MED ORDER — ALBUTEROL SULFATE HFA 108 (90 BASE) MCG/ACT IN AERS: 2.0000 | INHALATION_SPRAY | RESPIRATORY_TRACT | 12 refills | Status: DC | PRN

## 2019-09-21 NOTE — Assessment & Plan Note (Signed)
May be NSR today. Follows with cardiology

## 2019-09-21 NOTE — Patient Instructions (Signed)
Order- CXR   Dx Asthma moderate persistent, Left scapular pain  Refill script sent for albuterol rescue inhaler  Please call if we can help

## 2019-09-21 NOTE — Progress Notes (Signed)
Subjective:    Patient ID: Levi [name], male    DOB: [birth year], 74 y.o.   MRN: [id number] HPI  Male never smoker followed for allergy/asthma, rhinitis, history  ABPA, complicated by PAfib, GERD PFT- 01/04/2015-minimal restriction, minimal diffusion defect, insignificant response to bronchodilator. FVC 4.55/86%, FEV1 3.28/83%, FEV1/FVC 0.72, TLC 75%, DLCO 77 Office Spirometry 10/25/16-mild restriction of exhaled volume, minimal obstruction. FVC 4.04/78%, FEV1 2.71/71%, ratio 0.67, FEF 25-75% 1.68/58% Respiratory allergy profile 10/26/15- total IgE 60, minor elevations for dust mites and Aspergillus fumigatus ---------------------------------------------------------------------------------------------------.   04/28/2019- 74 year old male never smoker followed for allergy/asthma, rhinitis, history ABPA, complicated by PAfib, GERD Striverdi 2 puffs daily, pred maint 10 mg daily, Neb Duoneb, Qvar 80 2 p bid, albuterol hfa Reports some increased SOB without cough or fever over las few days with cold weather. Not sick. Using rescue twice daily. Cardiology gave lasix and his ankle edema resolved- unclear how this corresponds to his breathing.  Had flu vax. ACT score 13. Using QVAR 1x daily- can't see that it helps. Discussed meds. Continues long term maint pred, recognizing likely adrenal dependence now.  CXR 10/16/2018- IMPRESSION: 1. No acute cardiopulmonary disease. 2. Stable hyperexpansion. 3. Postsurgical changes  09/21/19- 74 year old male never smoker followed for allergy/asthma, rhinitis, history ABPA, complicated by PAfib, GERD Striverdi 2 puffs daily, pred maint 10 mg daily, Neb Duoneb, Qvar 80 2 p bid, albuterol hfa -----f/u moderate persistent asthma with acute exacerbation.  Fell in Feb> hip fx., THR. Sharp pains focal L lat scap border x 6 weeks intermittent. 2 Phizer Cpovax/  Feels he can't take comfortable deep breath. No wheeze or cough. Feet swell. Pending return to Cardiology   ROS-see HPI   + = positive Constitutional:    weight loss, night sweats, fevers, chills, fatigue, lassitude. HEENT:    headaches, difficulty swallowing, tooth/dental problems, sore throat,       sneezing, itching, ear ache, nasal congestion, post nasal drip, snoring CV:    chest pain, orthopnea, PND, +swelling in lower extremities, anasarca,                                                   dizziness, palpitations Resp:   + shortness of breath with exertion or at rest.                productive cough,   non-productive cough, coughing up of blood.              change in color of mucus.   wheezing.   Skin:    rash or lesions. GI:  No-   heartburn, indigestion, abdominal pain, nausea, vomiting, diarrhea,                 change in bowel habits, loss of appetite GU: dysuria, change in color of urine, no urgency or frequency.   flank pain. MS:   joint pain, stiffness, decreased range of motion, back pain.. L scapula not tender. Neuro-     nothing unusual Psych:  change in mood or affect.  depression or anxiety.   memory loss.    Objective:  OBJ- Physical Exam General- Alert, Oriented, Affect-appropriate, Distress- none acute, tall/thin Skin- rash-none, lesions- none, excoriation- none Lymphadenopathy- none Head- atraumatic            Eyes- Gross vision intact, PERRLA, conjunctivae and secretions clear  Ears- Hearing, canals-normal            Nose- Clear, no-Septal dev, mucus, polyps, erosion, perforation             Throat- Mallampati II , mucosa clear , drainage- none, tonsils- atrophic Neck- flexible , trachea midline, no stridor , thyroid nl, carotid no bruit Chest - symmetrical excursion , unlabored           Heart/CV- RRR , no murmur , no gallop  , no rub, nl s1 s2                           - JVD- none , edema- none, stasis changes- none, varices+ Left calf           Lung- +distant/ clear wheeze- none, cough- none , dullness-none, rub- none           Chest wall-  Abd-   Br/ Gen/ Rectal- Not done, not indicated Extrem- cyanosis- none, clubbing, none, atrophy- none, strength- nl Neuro- + head bob tremor    Assessment & Plan:

## 2019-09-21 NOTE — Assessment & Plan Note (Signed)
He thinks this is persistent with some gaps, since time of fall that broke hip. Plan- CXR

## 2019-09-21 NOTE — Assessment & Plan Note (Signed)
Exacerbation since his Fall. Doubt PE. Suspect pollen. Med talk done. Plan- CXR, refill rescue inhaler.

## 2019-09-27 DIAGNOSIS — G629 Polyneuropathy, unspecified: Secondary | ICD-10-CM | POA: Diagnosis not present

## 2019-09-28 ENCOUNTER — Encounter: Payer: Self-pay | Admitting: Nurse Practitioner

## 2019-09-28 ENCOUNTER — Other Ambulatory Visit: Payer: Self-pay

## 2019-09-28 ENCOUNTER — Ambulatory Visit (INDEPENDENT_AMBULATORY_CARE_PROVIDER_SITE_OTHER): Payer: Medicare Other | Admitting: Nurse Practitioner

## 2019-09-28 VITALS — BP 128/72 | HR 81 | Ht 74.0 in | Wt 158.8 lb

## 2019-09-28 DIAGNOSIS — I480 Paroxysmal atrial fibrillation: Secondary | ICD-10-CM | POA: Diagnosis not present

## 2019-09-28 DIAGNOSIS — R609 Edema, unspecified: Secondary | ICD-10-CM | POA: Diagnosis not present

## 2019-09-28 DIAGNOSIS — J449 Chronic obstructive pulmonary disease, unspecified: Secondary | ICD-10-CM

## 2019-09-28 DIAGNOSIS — B3781 Candidal esophagitis: Secondary | ICD-10-CM | POA: Diagnosis not present

## 2019-09-28 DIAGNOSIS — E782 Mixed hyperlipidemia: Secondary | ICD-10-CM

## 2019-09-28 DIAGNOSIS — I5022 Chronic systolic (congestive) heart failure: Secondary | ICD-10-CM | POA: Diagnosis not present

## 2019-09-28 DIAGNOSIS — B370 Candidal stomatitis: Secondary | ICD-10-CM | POA: Diagnosis not present

## 2019-09-28 LAB — LIPID PANEL
Chol/HDL Ratio: 1.7 ratio (ref 0.0–5.0)
Cholesterol, Total: 188 mg/dL (ref 100–199)
HDL: 109 mg/dL (ref >39)
LDL Chol Calc (NIH): 66 mg/dL (ref 0–99)
Triglycerides: 69 mg/dL (ref 0–149)
VLDL Cholesterol Cal: 13 mg/dL (ref 5–40)

## 2019-09-28 LAB — CBC
Hematocrit: 41.2 % (ref 37.5–51.0)
Hemoglobin: 14.1 g/dL (ref 13.0–17.7)
MCH: 33.3 pg — ABNORMAL HIGH (ref 26.6–33.0)
MCHC: 34.2 g/dL (ref 31.5–35.7)
MCV: 97 fL (ref 79–97)
Platelets: 174 10*3/uL (ref 150–450)
RBC: 4.24 x10E6/uL (ref 4.14–5.80)
RDW: 13 % (ref 11.6–15.4)
WBC: 10.1 10*3/uL (ref 3.4–10.8)

## 2019-09-28 LAB — HEPATIC FUNCTION PANEL
ALT: 18 IU/L (ref 0–44)
AST: 27 IU/L (ref 0–40)
Albumin: 4 g/dL (ref 3.7–4.7)
Alkaline Phosphatase: 85 IU/L (ref 39–117)
Bilirubin Total: 0.7 mg/dL (ref 0.0–1.2)
Bilirubin, Direct: 0.21 mg/dL (ref 0.00–0.40)
Total Protein: 5.6 g/dL — ABNORMAL LOW (ref 6.0–8.5)

## 2019-09-28 LAB — BASIC METABOLIC PANEL WITH GFR
BUN/Creatinine Ratio: 25 — ABNORMAL HIGH (ref 10–24)
BUN: 24 mg/dL (ref 8–27)
CO2: 22 mmol/L (ref 20–29)
Calcium: 9.2 mg/dL (ref 8.6–10.2)
Chloride: 105 mmol/L (ref 96–106)
Creatinine, Ser: 0.96 mg/dL (ref 0.76–1.27)
GFR calc Af Amer: 91 mL/min/1.73
GFR calc non Af Amer: 79 mL/min/1.73
Glucose: 92 mg/dL (ref 65–99)
Potassium: 4 mmol/L (ref 3.5–5.2)
Sodium: 139 mmol/L (ref 134–144)

## 2019-09-28 LAB — TSH: TSH: 3.68 u[IU]/mL (ref 0.450–4.500)

## 2019-09-28 MED ORDER — DILTIAZEM HCL ER COATED BEADS 180 MG PO CP24: ORAL_CAPSULE | ORAL | 3 refills | Status: DC

## 2019-09-28 NOTE — Patient Instructions (Signed)
After Visit Summary:  We will be checking the following labs today - BMET, CBC, HPF, Lipids and TSh   Medication Instructions:    Continue with your current medicines.   I refilled the Diltiazem today   If you need a refill on your cardiac medications before your next appointment, please call your pharmacy.     Testing/Procedures To Be Arranged:  N/A  Follow-Up:   See me in 6 months    At [facility], you and your health needs are our priority.  As part of our continuing mission to provide you with exceptional heart care, we have created designated Provider Care Teams.  These Care Teams include your primary Cardiologist (physician) and Advanced Practice Providers (APPs -  Physician Assistants and Nurse Practitioners) who all work together to provide you with the care you need, when you need it.  Special Instructions:  . Stay safe, stay home, wash your hands for at least 20 seconds and wear a mask when out in public.  . It was good to talk with you today.    Call the [facility] office at [phone] if you have any questions, problems or concerns.

## 2019-10-01 ENCOUNTER — Encounter: Payer: Self-pay | Admitting: Podiatry

## 2019-10-01 ENCOUNTER — Other Ambulatory Visit: Payer: Self-pay

## 2019-10-01 ENCOUNTER — Ambulatory Visit (INDEPENDENT_AMBULATORY_CARE_PROVIDER_SITE_OTHER): Payer: Medicare Other | Admitting: Podiatry

## 2019-10-01 ENCOUNTER — Ambulatory Visit (INDEPENDENT_AMBULATORY_CARE_PROVIDER_SITE_OTHER): Payer: Medicare Other

## 2019-10-01 VITALS — BP 135/73 | HR 70 | Temp 98.1°F | Resp 14

## 2019-10-01 DIAGNOSIS — M779 Enthesopathy, unspecified: Secondary | ICD-10-CM

## 2019-10-01 DIAGNOSIS — M79672 Pain in left foot: Secondary | ICD-10-CM | POA: Diagnosis not present

## 2019-10-01 DIAGNOSIS — Q828 Other specified congenital malformations of skin: Secondary | ICD-10-CM

## 2019-10-01 DIAGNOSIS — M79674 Pain in right toe(s): Secondary | ICD-10-CM

## 2019-10-01 DIAGNOSIS — M19072 Primary osteoarthritis, left ankle and foot: Secondary | ICD-10-CM | POA: Diagnosis not present

## 2019-10-01 DIAGNOSIS — M79671 Pain in right foot: Secondary | ICD-10-CM

## 2019-10-01 DIAGNOSIS — B351 Tinea unguium: Secondary | ICD-10-CM

## 2019-10-01 DIAGNOSIS — M19079 Primary osteoarthritis, unspecified ankle and foot: Secondary | ICD-10-CM

## 2019-10-01 DIAGNOSIS — M79675 Pain in left toe(s): Secondary | ICD-10-CM

## 2019-10-01 DIAGNOSIS — M19071 Primary osteoarthritis, right ankle and foot: Secondary | ICD-10-CM | POA: Diagnosis not present

## 2019-10-01 DIAGNOSIS — G629 Polyneuropathy, unspecified: Secondary | ICD-10-CM | POA: Diagnosis not present

## 2019-10-01 NOTE — Progress Notes (Signed)
Subjective:   Patient ID: Levi [name], male   DOB: 74 y.o.   MRN: [id number]   HPI 74 year old male presents the office today for concerns of chronic bilateral foot pain.  He has seen numerous different physicians for his feet and he has had multiple pairs of orthotics.  Is also had several steroid injections to his feet.  He states he is still active and he works out on a daily basis.  He has a callus on the left foot in the ball of his foot causing discomfort and he gets pain in the midfoot areas.  He denies any recent injury or trauma.  No significant increase in swelling recently there is no redness or warmth.  He also states he has neuropathies follow-up with neurosurgery later on today.  He is also been on prednisone for several years.   Review of Systems  All other systems reviewed and are negative.  Past Medical History:  Diagnosis Date  . Agent orange exposure 1970's   takes Imdur,Diltiazem, and Atacand daily  . ALLERGIC RHINITIS   . Asthma   . Bruises easily    d/t prednisone daily  . Chronic bronchitis ([place])    "used to get it q yr; last time ?2008" (04/28/2012)  . COPD (chronic obstructive pulmonary disease) ([facility])    agent orange exposure  . Degenerative disk disease    "qwhere" (04/28/2012)  . Diverticulitis   . Dysrhythmia    afib  . Elevated uric acid in blood    takes Allopurinol daily  . Enlarged prostate    but not on any meds  . GERD (gastroesophageal reflux disease)    takes Protonix daily  . H/O hiatal hernia   . Headache   . History of pneumonia    a. 2010  . Hyperlipidemia    takes Lipitor daily; pt. states he takes a preventive  . Joint pain   . Joint swelling   . PAF (paroxysmal atrial fibrillation) ([place])    a. Dx 04/2012, CHA2DS2VASc = 1 (age);  b. 04/2012 Echo: EF 40-50%, mild MR.  . Personal history of colonic adenomas 02/09/2013  . Shortness of breath dyspnea     Past Surgical History:  Procedure Laterality Date  . ANTERIOR CERVICAL  DECOMP/DISCECTOMY FUSION N/A 07/21/2012   Procedure: ANTERIOR CERVICAL DECOMPRESSION/DISCECTOMY FUSION 2 LEVELS;  Surgeon: [name], MD;  Location: [facility];  Service: Neurosurgery;  Laterality: N/A;  C4-5 C5-6 Anterior cervical decompression/diskectomy/fusion  . ANTERIOR LAT LUMBAR FUSION N/A 04/07/2017   Procedure: Thoracic twelve-Lumbar one Anterolateral decompression/fusion;  Surgeon: [name], MD;  Location: [place];  Service: Neurosurgery;  Laterality: N/A;  . BACK SURGERY     x 3  . CARDIAC CATHETERIZATION  11/2008   [name] - 20% calcified non flow limiting left main, 50% EF apical hypokinesis  . CERVICAL DISC ARTHROPLASTY N/A 04/07/2017   Procedure: Cervical six-seven Disc arthroplasty;  Surgeon: [name], MD;  Location: [place] [facility];  Service: Neurosurgery;  Laterality: N/A;  . CHEST TUBE INSERTION Left 04/11/2017   Procedure: CHEST TUBE INSERTION;  Surgeon: [name], MD;  Location: [place];  Service: Thoracic;  Laterality: Left;  . CHOLECYSTECTOMY    . COLONOSCOPY    . ESOPHAGOGASTRODUODENOSCOPY    . EYE SURGERY Bilateral 2011   steroidal encapsulation" (04/28/2012)  . FOOT SURGERY Right    x 2  . KNEE ARTHROSCOPY Bilateral   . LATERAL / POSTERIOR COMBINED FUSION LUMBAR SPINE  2012  . REVERSE  SHOULDER ARTHROPLASTY  04/28/2012   Procedure: REVERSE SHOULDER ARTHROPLASTY;  Surgeon: [name], MD;  Location: [place];  Service: Orthopedics;  Laterality: Left;  Left shouder reverse total shoulder arthroplasty  . SHOULDER SURGERY    . [place]  . TOTAL HIP ARTHROPLASTY Left 2011   "left" (04/28/2012)  . TOTAL HIP ARTHROPLASTY Right 07/31/2015   Procedure: TOTAL HIP ARTHROPLASTY ANTERIOR APPROACH;  Surgeon: [name], MD;  Location: [place];  Service: Orthopedics;  Laterality: Right;  . TOTAL KNEE ARTHROPLASTY  01/10/2012   Procedure: TOTAL KNEE ARTHROPLASTY;  Surgeon: [name], MD;  Location: [place];  Service: Orthopedics;;   left total knee arthroplasty  . TOTAL KNEE ARTHROPLASTY Right 06/17/2014   Procedure: TOTAL KNEE ARTHROPLASTY;  Surgeon: [name], MD;  Location: [place];  Service: Orthopedics;  Laterality: Right;     Current Outpatient Medications:  .  albuterol (VENTOLIN HFA) 108 (90 Base) MCG/ACT inhaler, Inhale 2 puffs into the lungs every 4 (four) hours as needed for wheezing or shortness of breath., Disp: 18 g, Rfl: 12 .  allopurinol (ZYLOPRIM) 100 MG tablet, Take 100 mg by mouth daily.  , Disp: , Rfl:  .  aspirin EC 81 MG tablet, Take 1 tablet (81 mg total) by mouth daily., Disp: 90 tablet, Rfl: 3 .  atorvastatin (LIPITOR) 20 MG tablet, TAKE 1 TABLET BY MOUTH EVERY DAY, Disp: 90 tablet, Rfl: 3 .  candesartan (ATACAND) 4 MG tablet, TAKE 1/2 TABLETS (2 MG TOTAL) BY MOUTH DAILY., Disp: 45 tablet, Rfl: 3 .  cyclobenzaprine (FLEXERIL) 10 MG tablet, Take 10 mg by mouth at bedtime as needed for muscle spasms., Disp: , Rfl:  .  diltiazem (CARDIZEM CD) 180 MG 24 hr capsule, TAKE 1 CAPSULE BY MOUTH EVERY DAY, Disp: 90 capsule, Rfl: 3 .  furosemide (LASIX) 20 MG tablet, Take 1 tablet (20 mg total) by mouth daily as needed. For shortness of breath and lower limb edema, Disp: 90 tablet, Rfl: 3 .  ipratropium-albuterol (DUONEB) 0.5-2.5 (3) MG/3ML SOLN, 1 NEBULE EVERY 6 HOURS AS NEEDED. **J45.3**, Disp: 270 mL, Rfl: 4 .  isosorbide mononitrate (IMDUR) 30 MG 24 hr tablet, TAKE 1 TABLET (30 MG TOTAL) BY MOUTH DAILY., Disp: 30 tablet, Rfl: 11 .  Nebulizers (COMPRESSOR/NEBULIZER) MISC, Use as directed, Disp: 1 each, Rfl: 0 .  omeprazole (PRILOSEC) 40 MG capsule, Take 1 capsule (40 mg total) by mouth 2 (two) times daily before a meal., Disp: 60 capsule, Rfl: 11 .  predniSONE (DELTASONE) 10 MG tablet, Take 1 tablet (10 mg total) by mouth daily. continuous, Disp: 30 tablet, Rfl: 5 .  STRIVERDI RESPIMAT 2.5 MCG/ACT AERS, INHALE 2 PUFFS BY MOUTH INTO THE LUNGS DAILY, Disp: 4 g, Rfl: 12 .  tamsulosin (FLOMAX) 0.4 MG CAPS  capsule, Take 0.4 mg by mouth at bedtime. , Disp: , Rfl: 11  Allergies  Allergen Reactions  . Morphine And Related Palpitations and Other (See Comments)    Made patient light-headed and sweaty; "my heart races" (04/28/2012), pt states too much morphine gave him this reaction  . Levaquin [Levofloxacin] Itching  . Fluticasone-Salmeterol Other (See Comments)    Patient somewhat disputes this (??)          Objective:  Physical Exam  General: AAO x3, NAD  Dermatological: Nails are hypertrophic, dystrophic, brittle, discolored, elongated 10. No surrounding redness or drainage. Tenderness nails 1-5 bilaterally. No open lesions or pre-ulcerative lesions are identified today.  Hyperkeratotic lesion left foot  submetatarsal 4 without any underlying ulceration drainage or signs of infection.  Vascular: Dorsalis Pedis artery and Posterior Tibial artery pedal pulses are 2/4 bilateral with immedate capillary fill time. There is no pain with calf compression, swelling, warmth, erythema.   Neruologic: Sensation decreased with Semmes Weinstein monofilament.  Musculoskeletal: Significant flattening of the arch is present bilaterally the right side worse than left and there is obvious signs of arthritic changes present with midfoot.  Majority of tenderness today still on the plantar medial midfoot on the first metatarsal cuneiform joint.  He also has discomfort of the dorsal aspect the foot.  Flexor, extensor tendons appear to be intact.  Muscular strength 5/5 in all groups tested bilateral.  Gait: Unassisted, Nonantalgic.       Assessment:   74 year old male with significant arthritic changes present bilaterally, onychomycosis with painful hyperkeratotic lesion.     Plan:  -Treatment options discussed including all alternatives, risks, and complications -Etiology of symptoms were discussed -X-rays were  obtained and reviewed and I independently reviewed them.  Significant arthritic changes  present in the midfoot area.   -He wants to pursue the steroid injection on the right foot.  Only area maximal tenderness on the plantar medial midfoot mixture of 1 cc Kenalog 10, 0.5 cc of Marcaine plain and 0.5 cc of lidocaine plain was infiltrated into the area of maximal tenderness without complications.  Care was taken to avoid injecting around any tendon structures. -He has had multiple pairs of orthotics.  We will have him follow-up in the [facility] office with [facility] for possible bracing and for further evaluation of inserts if needed.  If we do inserts DO NOT PLACE HIM INTO A FOAM BOX.  -Nails debrided [id number] without complications or bleeding -Hyperkeratotic lesion subdebrided x1 without any complications or bleeding -Patient to follow-up with neurosurgery in regards to the neuropathy.   Trula Slade DPM

## 2019-10-05 ENCOUNTER — Ambulatory Visit: Payer: Medicare Other | Admitting: Nurse Practitioner

## 2019-10-07 ENCOUNTER — Telehealth: Payer: Self-pay | Admitting: *Deleted

## 2019-10-07 NOTE — Telephone Encounter (Signed)
Per Dr Nelly Rout needs diabetic shoes and Dr Jacqualyn Posey stated that patient should not be have his feet put in the molds instead needs to be scanned due to feet issues. Lattie Haw

## 2019-10-11 ENCOUNTER — Ambulatory Visit: Payer: Medicare Other | Admitting: Orthotics

## 2019-10-11 ENCOUNTER — Other Ambulatory Visit: Payer: Self-pay

## 2019-10-11 DIAGNOSIS — R0789 Other chest pain: Secondary | ICD-10-CM

## 2019-10-11 DIAGNOSIS — M19079 Primary osteoarthritis, unspecified ankle and foot: Secondary | ICD-10-CM

## 2019-10-11 NOTE — Progress Notes (Signed)
Patient presents today for evaluation/casting for AFO brace 9R)).   Patient has hx of the following conditions: Gait instability,  Ankle instabilty,  Gait analysis done and patient displays abnormality of gait in both sagittial and frontal planes, and could benefit in aggressive ankle support.  Patient chose [place] brace w/ lace/speed laces.

## 2019-10-12 DIAGNOSIS — M19079 Primary osteoarthritis, unspecified ankle and foot: Secondary | ICD-10-CM | POA: Diagnosis not present

## 2019-10-12 DIAGNOSIS — M7751 Other enthesopathy of right foot: Secondary | ICD-10-CM | POA: Diagnosis not present

## 2019-10-12 DIAGNOSIS — M778 Other enthesopathies, not elsewhere classified: Secondary | ICD-10-CM | POA: Diagnosis not present

## 2019-10-19 DIAGNOSIS — M25552 Pain in left hip: Secondary | ICD-10-CM | POA: Diagnosis not present

## 2019-10-19 DIAGNOSIS — M79601 Pain in right arm: Secondary | ICD-10-CM | POA: Diagnosis not present

## 2019-10-19 DIAGNOSIS — M67911 Unspecified disorder of synovium and tendon, right shoulder: Secondary | ICD-10-CM | POA: Diagnosis not present

## 2019-10-21 ENCOUNTER — Encounter: Payer: Self-pay | Admitting: Neurology

## 2019-10-21 ENCOUNTER — Ambulatory Visit (INDEPENDENT_AMBULATORY_CARE_PROVIDER_SITE_OTHER): Payer: Medicare Other | Admitting: Neurology

## 2019-10-21 ENCOUNTER — Other Ambulatory Visit: Payer: Self-pay

## 2019-10-21 VITALS — BP 112/65 | HR 71 | Ht 74.0 in | Wt 158.0 lb

## 2019-10-21 DIAGNOSIS — R2689 Other abnormalities of gait and mobility: Secondary | ICD-10-CM

## 2019-10-21 DIAGNOSIS — G629 Polyneuropathy, unspecified: Secondary | ICD-10-CM | POA: Diagnosis not present

## 2019-10-21 DIAGNOSIS — G250 Essential tremor: Secondary | ICD-10-CM

## 2019-10-21 DIAGNOSIS — R296 Repeated falls: Secondary | ICD-10-CM

## 2019-10-21 NOTE — Progress Notes (Signed)
Subjective:    Patient ID: Levi [name] is a 74 y.o. male.  HPI     Levi Age, MD, PhD [facility] [address], [address] [place], [place] [address]  Dear Dr. Ellene [name],  I saw your patient, Levi [name], upon your kind request in my neurologic clinic today for initial consultation of his pain and weakness of his legs, concern for peripheral neuropathy.  The patient is unaccompanied today.  As you know, Levi [name] is a 74 year old right-handed gentleman with an underlying  Complex medical history of asthma, A. fib, hyperlipidemia, reflux disease with hiatal hernia, diverticulitis, degenerative disc disease, COPD, allergic rhinitis, history of agent orange exposure in the 70s, carpal tunnel syndrome, status post bilateral carpal tunnel release and ulnar release surgeries in January and February of this year, status post multiple surgeries including lumbar spine surgery, neck surgery, bilateral hip replacements, bilateral knee replacements, shoulder surgeries, who reports a longerstanding Hx of Numbness and tingling as well as pain in both lower extremities, right more than left as well as some numbness and tingling in both hands and forearms.  He is unsure as to when his symptoms started, probably several years ago.  He has had falls.  He has felt imbalanced.  He has started using a cane but did not bring it today.  He denies a family history of neuropathy or hereditary muscle diseases, is not diabetic.  He does not drink alcohol and has not had any alcohol in years, probably 40 years. He is a non-smoker.  He tried gabapentin sometime ago for his back, he is unsure if it helped but does not recall having any side effects.  He reports that he had shingles in the past and this affected his left-sided truncal area and he tried Lyrica for it which did not help.  He tries to hydrate well but does report mouth dryness. He had recent EMG nerve conduction velocity testing  through your office on 09/27/2019, supportive of sensorimotor peripheral neuropathy with some superimposed residuals from prior lumbar radiculopathy contributing to the EMG findings.  He had a CT myelogram of the spine on 05/12/19, and I reviewed the results: IMPRESSION: Cervical spine:   1. C4-C6 ACDF with solid arthrodesis and patent canal/foramina. 2. C6-7 interval disc arthroplasty with patent canal and foramina. 3. C3-4 chronic right foraminal impingement mainly from uncovertebral spurring.   Thoracic spine:   1. T2-3 focal advanced disc degeneration with height loss causing biforaminal impingement. 2. Spondylosis and disc fissuring at T7-8 below. 3. Diffusely patent spinal canal.   Lumbar spine:   1. T12-S1 fusion. Notable vacuum phenomenon at the L4-5 disc space with right S1 pedicle screw fracture, but definite solid posterior-lateral arthrodesis at L4-5. Solid arthrodesis is also seen at the other levels. 2. L4-5 subarticular recess narrowing on the right more than left with overall mild central stenosis. Previously:  04/21/2018:  74 year old right-handed gentleman with an underlying complex medical history of paroxysmal A. fib, COPD, reflux disease, hyperlipidemia, allergic rhinitis, coronary artery disease, gout, degenerative joint disease, anxiety, history of spinal stenosis, status post multiple surgeries including bilateral knee surgeries, foot surgeries, tonsillectomy, cholecystectomy, back surgeries, shoulder surgeries, neck surgery, and status post total hip replacement b/l, hardware in the back, who reports a sense of dizziness and intermittent nausea for the past 2-3 months. He reports intermittent vertiginous symptoms when laying in bed but has had lightheadedness when sitting and standing. He has had multiple falls, he has significant mobility issues,  multiple joint related and back surgeries. He has significant degenerative disease of the entire spine, sees Dr.  Ellene [name].  I reviewed your office note from 04/10/2018, which you kindly included. He was treated for vertigo symptoms with meclizine 12.5 mg 3 times a day for about 10 days. His wife has noticed a tremor. He has a family history of Parkinson's disease on his maternal side, 2 aunts, as he recalls, no family history of essential tremor. He tried meclizine but did not find it helpful. Of note, he is on a nebulizer as needed as well as albuterol as needed. He is on chronic prednisone, currently 10 mg daily. He tries to hydrate well but likes to drink diet soda, estimates that he can drink half a gallon of diet orange soda. He has a family history of stroke in his father who died at the [name] of 6 from a heart attack but also had a stroke. Paternal grandparents had strokes. Mom died at [name] 3 from diabetes complications. He has had a tremor affecting his right more than left hand and his head and neck area for at least one year, likely longer he believes. He is retired, lives with his wife. He does not smoke, does not currently drink alcohol. His Past Medical History Is Significant For: Past Medical History:  Diagnosis Date  . Agent orange exposure 1970's   takes Imdur,Diltiazem, and Atacand daily  . ALLERGIC RHINITIS   . Asthma   . Bruises easily    d/t prednisone daily  . Chronic bronchitis ([facility])    "used to get it q yr; last time ?2008" (04/28/2012)  . COPD (chronic obstructive pulmonary disease) (H[facility])    agent orange exposure  . Degenerative disk disease    "qwhere" (04/28/2012)  . Diverticulitis   . Dysrhythmia    afib  . Elevated uric acid in blood    takes Allopurinol daily  . Enlarged prostate    but not on any meds  . GERD (gastroesophageal reflux disease)    takes Protonix daily  . H/O hiatal hernia   . Headache   . History of pneumonia    a. 2010  . Hyperlipidemia    takes Lipitor daily; pt. states he takes a preventive  . Joint pain   . Joint swelling   . PAF (paroxysmal atrial  fibrillation) ([facility])    a. Dx 04/2012, CHA2DS2VASc = 1 ([name]);  b. 04/2012 Echo: EF 40-50%, mild MR.  . Personal history of colonic adenomas 02/09/2013  . Shortness of breath dyspnea     His Past Surgical History Is Significant For: Past Surgical History:  Procedure Laterality Date  . ANTERIOR CERVICAL DECOMP/DISCECTOMY FUSION N/A 07/21/2012   Procedure: ANTERIOR CERVICAL DECOMPRESSION/DISCECTOMY FUSION 2 LEVELS;  Surgeon: [name], MD;  Location: [facility];  Service: Neurosurgery;  Laterality: N/A;  C4-5 C5-6 Anterior cervical decompression/diskectomy/fusion  . ANTERIOR LAT LUMBAR FUSION N/A 04/07/2017   Procedure: Thoracic twelve-Lumbar one Anterolateral decompression/fusion;  Surgeon: [name], MD;  Location: [facility];  Service: Neurosurgery;  Laterality: N/A;  . BACK SURGERY     x 3  . CARDIAC CATHETERIZATION  11/2008   [name] - 20% calcified non flow limiting left main, 50% EF apical hypokinesis  . CERVICAL DISC ARTHROPLASTY N/A 04/07/2017   Procedure: Cervical six-seven Disc arthroplasty;  Surgeon: [name], MD;  Location: [facility];  Service: Neurosurgery;  Laterality: N/A;  . CHEST TUBE INSERTION Left 04/11/2017   Procedure: CHEST TUBE INSERTION;  Surgeon: Lucianne Lei  Donney Rankins, MD;  Location: [facility];  Service: Thoracic;  Laterality: Left;  . CHOLECYSTECTOMY    . COLONOSCOPY    . ESOPHAGOGASTRODUODENOSCOPY    . EYE SURGERY Bilateral 2011   steroidal encapsulation" (04/28/2012)  . FOOT SURGERY Right    x 2  . KNEE ARTHROSCOPY Bilateral   . LATERAL / POSTERIOR COMBINED FUSION LUMBAR SPINE  2012  . REVERSE SHOULDER ARTHROPLASTY  04/28/2012   Procedure: REVERSE SHOULDER ARTHROPLASTY;  Surgeon: [name], MD;  Location: [facility];  Service: Orthopedics;  Laterality: Left;  Left shouder reverse total shoulder arthroplasty  . SHOULDER SURGERY    . [place]  . TOTAL HIP ARTHROPLASTY Left 2011   "left" (04/28/2012)  . TOTAL HIP ARTHROPLASTY  Right 07/31/2015   Procedure: TOTAL HIP ARTHROPLASTY ANTERIOR APPROACH;  Surgeon: [name], MD;  Location: [facility];  Service: Orthopedics;  Laterality: Right;  . TOTAL KNEE ARTHROPLASTY  01/10/2012   Procedure: TOTAL KNEE ARTHROPLASTY;  Surgeon: [name], MD;  Location: [facility];  Service: Orthopedics;;  left total knee arthroplasty  . TOTAL KNEE ARTHROPLASTY Right 06/17/2014   Procedure: TOTAL KNEE ARTHROPLASTY;  Surgeon: [name], MD;  Location: [facility];  Service: Orthopedics;  Laterality: Right;    His Family History Is Significant For: Family History  Problem Relation [name] of Onset  . Heart disease Father        Died 74, MI  . Diabetes Mother   . Prostate cancer Brother   . Colon cancer Neg Hx   . Esophageal cancer Neg Hx   . Rectal cancer Neg Hx   . Stomach cancer Neg Hx   . Pancreatic cancer Neg Hx   . Kidney disease Neg Hx   . Liver disease Neg Hx     His Social History Is Significant For: Social History   Socioeconomic History  . Marital status: Married    Spouse name: Not on file  . Number of children: 1  . Years of education: Not on file  . Highest education level: Not on file  Occupational History  . 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 Smoking status: Never Smoker  . Smokeless tobacco: Never Used  Substance and Sexual Activity  . Alcohol use: No    Alcohol/week: 0.0 standard drinks  . Drug use: No  . Sexual activity: Yes  Other Topics Concern  . 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 Difficulty of Paying Living Expenses:   Food Insecurity:   . Worried About Charity fundraiser in the Last Year:   . Arboriculturist in the Last Year:    Transportation Needs:   . Film/video editor (Medical):   [name] Lack of Transportation (Non-Medical):   Physical Activity:   . Days of Exercise per Week:   . Minutes of Exercise per Session:   Stress:   . Feeling of Stress :   Social Connections:   . Frequency of Communication with Friends and Family:   . Frequency of Social Gatherings with Friends and Family:   . Attends Religious Services:   . Active Member  of Clubs or Organizations:   . Attends Archivist Meetings:   [name] Marital Status:     His Allergies Are:  Allergies  Allergen Reactions  . Morphine And Related Palpitations and Other (See Comments)    Made patient light-headed and sweaty; "my heart races" (04/28/2012), pt states too much morphine gave him this reaction  . Levaquin [Levofloxacin] Itching  . Fluticasone-Salmeterol Other (See Comments)    Patient somewhat disputes this (??)  :   His Current Medications Are:  Outpatient Encounter Medications as of 10/21/2019  Medication Sig  . albuterol (VENTOLIN HFA) 108 (90 Base) MCG/ACT inhaler Inhale 2 puffs into the lungs every 4 (four) hours as needed for wheezing or shortness of breath.  . allopurinol (ZYLOPRIM) 100 MG tablet Take 100 mg by mouth daily.    [name] aspirin EC 81 MG tablet Take 1 tablet (81 mg total) by mouth daily.  [name] atorvastatin (LIPITOR) 20 MG tablet TAKE 1 TABLET BY MOUTH EVERY DAY  . candesartan (ATACAND) 4 MG tablet TAKE 1/2 TABLETS (2 MG TOTAL) BY MOUTH DAILY.  . cyclobenzaprine (FLEXERIL) 10 MG tablet Take 10 mg by mouth at bedtime as needed for muscle spasms.  [name] diltiazem (CARDIZEM CD) 180 MG 24 hr capsule TAKE 1 CAPSULE BY MOUTH EVERY DAY  . furosemide (LASIX) 20 MG tablet Take 1 tablet (20 mg total) by mouth daily as needed. For shortness of breath and lower limb edema  . ipratropium-albuterol (DUONEB) 0.5-2.5 (3) MG/3ML SOLN 1 NEBULE EVERY 6 HOURS AS NEEDED. **J45.3**  . isosorbide mononitrate (IMDUR) 30 MG 24 hr tablet TAKE 1 TABLET (30 MG  TOTAL) BY MOUTH DAILY.  . Nebulizers (COMPRESSOR/NEBULIZER) MISC Use as directed  . omeprazole (PRILOSEC) 40 MG capsule Take 1 capsule (40 mg total) by mouth 2 (two) times daily before a meal.  . predniSONE (DELTASONE) 10 MG tablet Take 1 tablet (10 mg total) by mouth daily. continuous  . STRIVERDI RESPIMAT 2.5 MCG/ACT AERS INHALE 2 PUFFS BY MOUTH INTO THE LUNGS DAILY  . tamsulosin (FLOMAX) 0.4 MG CAPS capsule Take 0.4 mg by mouth at bedtime.    No facility-administered encounter medications on file as of 10/21/2019.  :   Review of Systems:  Out of a complete 14 point review of systems, all are reviewed and negative with the exception of these symptoms as listed below:  Review of Systems  Neurological:       Here for to discuss worsening neuropathy symptoms in his right leg/foot mostly. Sx are mild in left leg and hands. Pt also reports increase in falls over the last few months. Pt reports last fall was yesterday. Pt's last visit was in 2019.     Objective:  Neurological Exam  Physical Exam Physical Examination:   Vitals:   10/21/19 0826  BP: 112/65  Pulse: 71    General Examination: The patient is a very pleasant 74 y.o. male in no acute distress. He appears well-developed and well-nourished and well groomed.   HEENT: Normocephalic, atraumatic, pupils are equal, round and reactive to light and accommodation. He has a side-to-side head tremor, intermittent. Hearing is mildly impaired. Face is symmetric, no obvious facial masking, nuchal tone is fairly good. He does have limitation to neck movements, normal facial sensation.  Airway examination reveals moderate mouth dryness, no abnormal tongue or mouth movements, no lip tremor. Tongue protrudes centrally and palate elevates symmetrically.   Chest: Clear to auscultation without wheezing, rhonchi or crackles noted.  Heart: S1+S2+0, regular and  normal without murmurs, rubs or gallops noted.   Abdomen: Soft, non-tender and  non-distended with normal bowel sounds appreciated on auscultation.  Extremities: There is no pitting edema in the distal lower extremities bilaterally.   Skin: Warm and dry without trophic changes noted.  Chronic appearing bruising and [name]-related changes noted.  Musculoskeletal: exam reveals Significant limitation in joint mobility in both hips and knees, scars both knees.   Neurologically:  Mental status: The patient is awake, alert and oriented in all 4 spheres. His immediate and remote memory, attention, language skills and fund of knowledge are appropriate. There is no evidence of aphasia, agnosia, apraxia or anomia. Speech is clear with normal prosody and enunciation. Thought process is linear. Mood is normal and affect is normal.  Cranial nerves II - XII are as described above under HEENT exam.  Motor exam: thin bulk, global strength of 4 out of 5, slightly weaker on the right compared to left, especially right hip flexor of 4- out of 5.  Foot dorsiflexion also 4- out of 5 on the right.   Fine motor skills with finger taps and foot taps are impaired globally. He has limitation in ankle mobility bilaterally, right more than left. Romberg is not testable safely. Reflexes are trace in the upper extremities and absent in the lower extremities. Cerebellar testing: No dysmetria or intention tremor. Heel to shin is not possible.  Sensory exam: Impaired to all modalities in the distal lower extremities up to mid calf on the left and up to the knee on the right, also decreased sensation to all modalities in both hands, up to above wrist area bilaterally.   Gait, station and balance: He stands with difficulty and has to push himself up. He stands wide-based. He walks slowly and cautiously, no shuffling, no festination, fairly preserved arm swing bilaterally.  He walks in securely, he has trouble with turns, balance is impaired.  He did not bring a walking aid.    Assessment and Plan:   In  summary, Levi [name] is a very pleasant 74 year old male with an underlying complex medical history of paroxysmal A. fib, COPD, reflux disease, hyperlipidemia, allergic rhinitis, coronary artery disease, gout, degenerative joint disease, anxiety, history of spinal stenosis, status post multiple surgeries including bilateral knee surgeries, foot surgeries, tonsillectomy, cholecystectomy, back surgeries, shoulder surgeries, neck surgery, and status post total hip replacement b/l, hardware in the back, who presents for evaluation of his numbness and tingling and pain in the lower extremities, right more than left and also in the upper extremities to a lesser degree.  His history and examination are in keeping with peripheral neuropathy of unclear etiology.  He had multiple surgeries, he is also noted to be mildly weak.  We talked about peripheral neuropathy quite at length today.  We will proceed with repeat EMG and nerve conduction velocity testing of the lower extremities.  His balance problem and gait disorder is likely due to multiple factors and not just from neuropathy.  He has had joint replacement surgeries and significant arthritis of the spine and larger joints.  In addition, having fallen adds to risk of further falls, he has neuropathy and may not always hydrate very well.  He has occasionally used a cane.  He has a walker available and is encouraged to start using his walker.  We will proceed also with blood work today to look for any treatable causes of neuropathy.   In addition, having a head tremor further adds to balance  impairment.  He also has a history of dizziness and orthostatic hypotension.  We talked about symptomatic treatment with gabapentin which we may try again.  He did not have any improvement of his shingles symptoms when he tried Lyrica but does remember trying gabapentin for his back in the past.  We will keep him posted as to his test results and follow-up afterwards.  I answered  all of his questions today and he was in agreement. Thank you very much for allowing me to participate in the care of this nice patient. If I can be of any further assistance to you please do not hesitate to call me at [phone].  Sincerely,   Levi Age, MD, PhD

## 2019-10-21 NOTE — Patient Instructions (Addendum)
It was good to see you again today.  I am sorry you have had frequent falls.    I would recommend for gait safety that you start using your walker consistently.  You have a walker from the floor.  I do not believe that just a cane would be enough for your safety while walking.  I believe you have a multifactorial gait disorder, meaning, that it is Wyn Quaker is due to a combination of factors. These factors include: Degenerative arthritis of the upper and lower back, multiple joint replacement surgeries, deconditioning, prior falls, aging, and peripheral neuropathy.    Please remember to stand up slowly and get your bearings first turn slowly, no bending down to pick anything, no heavy lifting, be extra careful at night and first thing in the morning. Also, be careful in the Bathroom and the kitchen.   Remember to drink plenty of fluid, eat healthy meals and do not skip any meals. Try to eat protein with a every meal and eat a healthy snack such as fruit or nuts or yogurt in between meals. Try to keep a regular sleep-wake schedule and try to exercise daily, particularly in the form of walking, with your walker.  As far as your medications are concerned, I would like to suggest no new medications. We may consider gabapentin.   You likely have a condition called peripheral neuropathy, i. e. nerve damage. There is no specific treatment for most neuropathies. The most common cause for neuropathy is diabetes in this country, in which case, tight glucose control is key. Other causes include thyroid disease, and some vitamin deficiencies. Certain medications such as chemotherapy agents and other chemicals or toxins including alcohol can cause neuropathy. There are some genetic conditions or hereditary neuropathies. Typically patients will report a family history of neuropathy in those conditions. There are cases associated with cancers and autoimmune conditions. Most neuropathies are progressive unless a root cause  can be found and treated. For most neuropathies there is no actual cure or reversing of symptoms. Painful neuropathy can be difficult to treat symptomatically, but there are some medications available to ease the symptoms. Electrophysiologic testing with nerve conduction velocity studies and EMG (muscle testing) do not always pick up neuropathies that affect the smallest fibers. Other common tests include different type of blood work, and rarely, spinal fluid testing, and sometimes we resort to asking for a nerve and muscle biopsy.  As discussed, we will do blood work today and schedule you for a repeat EMG and nerve conduction velocity test, which is an electrical nerve and muscle test.   We will call you with the results.  We may consider gabapentin again.  You have tried it before for your back pain as I understand, Lyrica did not help when you tried it for your shingles as I understand.

## 2019-10-26 ENCOUNTER — Telehealth: Payer: Self-pay

## 2019-10-26 ENCOUNTER — Other Ambulatory Visit: Payer: Self-pay | Admitting: Internal Medicine

## 2019-10-26 NOTE — Telephone Encounter (Signed)
I contacted the pt and advised of result. He verbalized understanding and will consult with PCP. Lab results have been sent.

## 2019-10-26 NOTE — Telephone Encounter (Signed)
-----   Message from [name], MD sent at 10/26/2019  7:05 AM EDT ----- Please call patient regarding his blood test results:  while not all test results are back, his thyroid function was abnormal and his B12 level was low. I would recommend that he make an appointment with his primary care physician for these issues.  Lower B12 levels can cause issues with balance, and neuropathy.  I would like for him to make an appointment with his primary care physician as soon as possible to discuss B12 supplementation including B12 injections.  Also, his thyroid screening test called TSH showed an abnormality that can be seen in patients with low thyroid function called hypothyroidism.  We will update him with a phone call if any of the 2 pending results are abnormal.

## 2019-10-26 NOTE — Progress Notes (Signed)
Please call patient regarding his blood test results:  while not all test results are back, his thyroid function was abnormal and his B12 level was low. I would recommend that he make an appointment with his primary care physician for these issues.  Lower B12 levels can cause issues with balance, and neuropathy.  I would like for him to make an appointment with his primary care physician as soon as possible to discuss B12 supplementation including B12 injections.  Also, his thyroid screening test called TSH showed an abnormality that can be seen in patients with low thyroid function called hypothyroidism.  We will update him with a phone call if any of the 2 pending results are abnormal.

## 2019-10-27 ENCOUNTER — Ambulatory Visit: Payer: Medicare Other | Admitting: Internal Medicine

## 2019-10-27 LAB — COMPREHENSIVE METABOLIC PANEL
ALT: 15 IU/L (ref 0–44)
AST: 19 IU/L (ref 0–40)
Albumin/Globulin Ratio: 2.3 — ABNORMAL HIGH (ref 1.2–2.2)
Albumin: 4.1 g/dL (ref 3.7–4.7)
Alkaline Phosphatase: 83 IU/L (ref 48–121)
BUN/Creatinine Ratio: 27 — ABNORMAL HIGH (ref 10–24)
BUN: 24 mg/dL (ref 8–27)
Bilirubin Total: 0.6 mg/dL (ref 0.0–1.2)
CO2: 24 mmol/L (ref 20–29)
Calcium: 9.5 mg/dL (ref 8.6–10.2)
Chloride: 104 mmol/L (ref 96–106)
Creatinine, Ser: 0.88 mg/dL (ref 0.76–1.27)
GFR calc Af Amer: 99 mL/min/{1.73_m2} (ref >59)
GFR calc non Af Amer: 86 mL/min/{1.73_m2} (ref >59)
Globulin, Total: 1.8 g/dL (ref 1.5–4.5)
Glucose: 115 mg/dL — ABNORMAL HIGH (ref 65–99)
Potassium: 4.5 mmol/L (ref 3.5–5.2)
Sodium: 140 mmol/L (ref 134–144)
Total Protein: 5.9 g/dL — ABNORMAL LOW (ref 6.0–8.5)

## 2019-10-27 LAB — CK: Total CK: 59 U/L (ref 41–331)

## 2019-10-27 LAB — MULTIPLE MYELOMA PANEL, SERUM
Albumin SerPl Elph-Mcnc: 3.5 g/dL (ref 2.9–4.4)
Albumin/Glob SerPl: 1.5 (ref 0.7–1.7)
Alpha 1: 0.3 g/dL (ref 0.0–0.4)
Alpha2 Glob SerPl Elph-Mcnc: 0.7 g/dL (ref 0.4–1.0)
B-Globulin SerPl Elph-Mcnc: 1 g/dL (ref 0.7–1.3)
Gamma Glob SerPl Elph-Mcnc: 0.4 g/dL (ref 0.4–1.8)
Globulin, Total: 2.4 g/dL (ref 2.2–3.9)
IgA/Immunoglobulin A, Serum: 64 mg/dL (ref 61–437)
IgG (Immunoglobin G), Serum: 410 mg/dL — ABNORMAL LOW (ref 603–1613)
IgM (Immunoglobulin M), Srm: 11 mg/dL — ABNORMAL LOW (ref 15–143)

## 2019-10-27 LAB — C-REACTIVE PROTEIN: CRP: <1 mg/L (ref 0–10)

## 2019-10-27 LAB — HEAVY METALS PROFILE II, BLOOD
Arsenic: 6 ug/L (ref 2–23)
Cadmium: <0.5 ug/L (ref 0.0–1.2)
Lead, Blood: 3 ug/dL (ref 0–4)
Mercury: 1.5 ug/L (ref 0.0–14.9)

## 2019-10-27 LAB — B12 AND FOLATE PANEL
Folate: 13.6 ng/mL (ref >3.0)
Vitamin B-12: 224 pg/mL — ABNORMAL LOW (ref 232–1245)

## 2019-10-27 LAB — RPR: RPR Ser Ql: NONREACTIVE

## 2019-10-27 LAB — VITAMIN B1: Thiamine: 206.2 nmol/L — ABNORMAL HIGH (ref 66.5–200.0)

## 2019-10-27 LAB — TSH: TSH: 5.33 u[IU]/mL — ABNORMAL HIGH (ref 0.450–4.500)

## 2019-10-27 LAB — ANA W/REFLEX: Anti Nuclear Antibody (ANA): NEGATIVE

## 2019-10-27 LAB — SEDIMENTATION RATE: Sed Rate: 2 mm/hr (ref 0–30)

## 2019-10-27 LAB — HGB A1C W/O EAG: Hgb A1c MFr Bld: 5.5 % (ref 4.8–5.6)

## 2019-10-27 LAB — RHEUMATOID FACTOR: Rheumatoid fact SerPl-aCnc: <10 IU/mL (ref 0.0–13.9)

## 2019-10-27 NOTE — Progress Notes (Signed)
In addition to previous recommendations regarding his thyroid function and B12 deficiency his protein breakdown of his blood shows lower immunoglobulin levels.  This is a nonspecific finding but may be worth getting input from a specialist called hematologist.  If he is agreeable, I would like to make a referral to hematology for his low immunoglobulin levels. Please put in referral if he is okay with it. Indication: hypogammaglobulinemia.

## 2019-10-28 ENCOUNTER — Telehealth: Payer: Self-pay

## 2019-10-28 DIAGNOSIS — D801 Nonfamilial hypogammaglobulinemia: Secondary | ICD-10-CM

## 2019-10-28 NOTE — Telephone Encounter (Signed)
Pt returned call. Please call back when available. 

## 2019-10-28 NOTE — Telephone Encounter (Signed)
-----   Message from Levi Age, MD sent at 10/27/2019  4:41 PM EDT ----- In addition to previous recommendations regarding his thyroid function and B12 deficiency his protein breakdown of his blood shows lower immunoglobulin levels.  This is a nonspecific finding but may be worth getting input from a specialist called hematologist.  If he is agreeable, I would like to make a referral to hematology for his low immunoglobulin levels. Please put in referral if he is okay with it. Indication: hypogammaglobulinemia.

## 2019-10-28 NOTE — Addendum Note (Signed)
Addended by: Verlin Grills on: 10/28/2019 04:02 PM   Modules accepted: Orders

## 2019-10-28 NOTE — Telephone Encounter (Signed)
I called pt. No answer, left a message asking pt to call me back.   

## 2019-10-28 NOTE — Telephone Encounter (Signed)
I reached out to the pt and advised of results. He verbalized understanding and is agreeable to the hematology referral. Referral placed and pt also wanted to make note he is scheduled too see pcp in regards to thyroid and b12 findings.

## 2019-11-01 DIAGNOSIS — L301 Dyshidrosis [pompholyx]: Secondary | ICD-10-CM | POA: Diagnosis not present

## 2019-11-05 ENCOUNTER — Telehealth: Payer: Self-pay | Admitting: Family

## 2019-11-05 NOTE — Telephone Encounter (Signed)
lvm to inform patient of referral appointment 7/9 at 11 am.

## 2019-11-08 ENCOUNTER — Other Ambulatory Visit: Payer: Self-pay

## 2019-11-08 ENCOUNTER — Ambulatory Visit (INDEPENDENT_AMBULATORY_CARE_PROVIDER_SITE_OTHER): Payer: Medicare Other | Admitting: Orthotics

## 2019-11-08 DIAGNOSIS — M19079 Primary osteoarthritis, unspecified ankle and foot: Secondary | ICD-10-CM

## 2019-11-08 DIAGNOSIS — M779 Enthesopathy, unspecified: Secondary | ICD-10-CM

## 2019-11-08 DIAGNOSIS — M7751 Other enthesopathy of right foot: Secondary | ICD-10-CM

## 2019-11-10 DIAGNOSIS — M79672 Pain in left foot: Secondary | ICD-10-CM | POA: Diagnosis not present

## 2019-11-10 DIAGNOSIS — M19071 Primary osteoarthritis, right ankle and foot: Secondary | ICD-10-CM | POA: Diagnosis not present

## 2019-11-10 DIAGNOSIS — L84 Corns and callosities: Secondary | ICD-10-CM | POA: Diagnosis not present

## 2019-11-17 ENCOUNTER — Other Ambulatory Visit: Payer: Self-pay

## 2019-11-17 ENCOUNTER — Encounter (INDEPENDENT_AMBULATORY_CARE_PROVIDER_SITE_OTHER): Payer: Medicare Other | Admitting: Neurology

## 2019-11-17 ENCOUNTER — Ambulatory Visit (INDEPENDENT_AMBULATORY_CARE_PROVIDER_SITE_OTHER): Payer: Medicare Other | Admitting: Neurology

## 2019-11-17 DIAGNOSIS — G250 Essential tremor: Secondary | ICD-10-CM | POA: Diagnosis not present

## 2019-11-17 DIAGNOSIS — R296 Repeated falls: Secondary | ICD-10-CM

## 2019-11-17 DIAGNOSIS — G629 Polyneuropathy, unspecified: Secondary | ICD-10-CM

## 2019-11-17 DIAGNOSIS — Z0289 Encounter for other administrative examinations: Secondary | ICD-10-CM

## 2019-11-17 DIAGNOSIS — R2689 Other abnormalities of gait and mobility: Secondary | ICD-10-CM

## 2019-11-17 NOTE — Procedures (Signed)
Full Name: Levi [name] Gender: Male MRN #: [id number] Date of Birth: 12-15-1945    Visit Date: 11/17/2019 07:21 Age: 75 Years Examining Physician: Marcial Pacas, MD  Referring Physician: [name], MD Height: 6 feet 2 inch History: 74 year old male with history of cervical, lumbar decompression surgery, bilateral hip, knee replacement, complains of bilateral foot right more than left deformity, numbness, gait abnormality  On examination: He has lost arch on the right foot, mild bilateral toe flexion extension weakness, length dependent sensory changes, hyperreflexia of bilateral upper and lower extremities, antalgic, wide-based cautious gait  Summary of the tests: Nerve conduction study: Bilateral sural, left superficial peroneal and left ulnar sensory responses showed mildly prolonged peak latency, with moderate to significantly decreased snap amplitude.  Right superficial peroneal sensory responses were absent.  Left radial sensory response was normal.  Bilateral tibial, left peroneal to EDB motor responses showed significantly decreased CMAP amplitude, with moderately slow conduction velocity.  Right peroneal to EDB motor response was absent.  Left ulnar motor responses were normal  Electromyography:  Selected needle examination of bilateral lower extremity muscles, bilateral lumbosacral paraspinal muscles was performed.  There is evidence of chronic neuropathic changes involving bilateral tibialis anterior, tibialis posterior, peroneal longus, vastus lateralis, medial gastrocnemius.  There was fibrous change along bilateral lumbosacral paraspinal muscles, with well-healed lower lumbar scar, there was no spontaneous activity   Conclusion: This is an abnormal study.  There is electrodiagnostic evidence of mild bilateral lumbosacral radiculopathy, mainly involving bilateral L4, L5, S1 myotomes, there is no evidence of active process.  In addition, there is evidence of  mild length dependent axonal sensorimotor polyneuropathy.    ------------------------------- Marcial Pacas, M.D. PhD  [facility] [address], [place], [place] [address] Tel: [phone] Fax: [phone]  Verbal informed consent was obtained from the patient, patient was informed of potential risk of procedure, including bruising, bleeding, hematoma formation, infection, muscle weakness, muscle pain, numbness, among others.        [place]    Nerve / Sites Muscle Latency Ref. Amplitude Ref. Rel Amp Segments Distance Velocity Ref. Area    ms ms mV mV %  cm m/s m/s mVms  L Ulnar - ADM     Wrist ADM 3.3 ?3.3 8.4 ?6.0 100 Wrist - ADM 7   31.5     B.Elbow ADM 8.1  7.8  92.6 B.Elbow - Wrist 24 49 ?49 30.0     A.Elbow ADM 10.2  7.6  97.8 A.Elbow - B.Elbow 10 49 ?49 29.1  R Peroneal - EDB     Ankle EDB NR ?6.5 NR ?2.0 NR Ankle - EDB 9   NR     Fib head EDB NR  NR  NR Fib head - Ankle 35 NR ?44 NR     Pop fossa EDB NR  NR  NR Pop fossa - Fib head 10 NR ?44 NR         Pop fossa - Ankle      L Peroneal - EDB     Ankle EDB 4.5 ?6.5 0.1 ?2.0 100 Ankle - EDB 9   0.5     Fib head EDB 18.6  0.1  122 Fib head - Ankle 35 25 ?44 0.5     Pop fossa EDB 21.8  0.2  109 Pop fossa - Fib head 10 32 ?44 0.5         Pop fossa - Ankle  R Tibial - AH     Ankle AH 3.3 ?5.8 1.4 ?4.0 100 Ankle - AH 5   4.6     Pop fossa AH 17.6  0.5  39.8 Pop fossa - Ankle 42 29 ?41 1.4  L Tibial - AH     Ankle AH 3.7 ?5.8 0.2 ?4.0 100 Ankle - AH 4   1.6     Pop fossa AH 18.6  0.5  269 Pop fossa - Ankle 42 28 ?41 2.8               SNC    Nerve / Sites Rec. Site Peak Lat Ref.  Amp Ref. Segments Distance    ms ms V V  cm  L Radial - Anatomical snuff box (Forearm)     Forearm Wrist 2.7 ?2.9 26 ?15 Forearm - Wrist 10  R Sural - Ankle (Calf)     Calf Ankle 4.6 ?4.4 2 ?6 Calf - Ankle 14  L Sural - Ankle (Calf)     Calf Ankle 5.1 ?4.4 2 ?6 Calf - Ankle 14  R Superficial peroneal - Ankle     Lat  leg Ankle NR ?4.4 NR ?6 Lat leg - Ankle 14  L Superficial peroneal - Ankle     Lat leg Ankle 4.6 ?4.4 2 ?6 Lat leg - Ankle 14  L Ulnar - Orthodromic, (Dig V, Mid palm)     Dig V Wrist 4.8 ?3.1 3 ?5 Dig V - Wrist 13                 F  Wave    Nerve F Lat Ref.   ms ms  R Tibial - AH 69.7 ?56.0  L Tibial - AH 77.1 ?56.0  L Ulnar - ADM 36.1 ?32.0           EMG Summary Table    Spontaneous MUAP Recruitment  Muscle IA Fib PSW Fasc Other Amp Dur. Poly Pattern  R. Tibialis anterior Normal None None None _______ Normal Normal Normal Reduced  R. Peroneus longus Normal None None None _______ Normal Normal Normal Reduced  R. Gastrocnemius (Medial head) Normal None None None _______ Normal Normal Normal Reduced  R. Vastus lateralis Normal None None None _______ Normal Normal Normal Reduced  R. Abductor hallucis Increased None None None _______ Normal Normal Normal Reduced  R. Lumbar paraspinals (mid) Decreased None None None _______ Normal Normal Normal Normal  R. Lumbar paraspinals (low) Decreased None None None _______ Normal Normal Normal Normal  L. Tibialis posterior Normal None None None _______ Normal Normal Normal Reduced  L. Tibialis anterior Normal None None None _______ Normal Normal Normal Reduced  L. Peroneus longus Normal None None None _______ Normal Normal Normal Reduced  L. Gastrocnemius (Medial head) Normal None None None _______ Normal Normal Normal Reduced  L. Vastus lateralis Normal None None None _______ Normal Normal Normal Reduced  L. Lumbar paraspinals (mid) Decreased None None None _______ Normal Normal Normal Normal  L. Lumbar paraspinals (low) Decreased None None None _______ Normal Normal Normal Normal

## 2019-11-19 DIAGNOSIS — G629 Polyneuropathy, unspecified: Secondary | ICD-10-CM | POA: Diagnosis not present

## 2019-11-23 ENCOUNTER — Other Ambulatory Visit (HOSPITAL_BASED_OUTPATIENT_CLINIC_OR_DEPARTMENT_OTHER): Payer: Self-pay | Admitting: Family Medicine

## 2019-11-23 DIAGNOSIS — R946 Abnormal results of thyroid function studies: Secondary | ICD-10-CM

## 2019-11-24 ENCOUNTER — Telehealth: Payer: Self-pay

## 2019-11-24 NOTE — Progress Notes (Signed)
Please call patient and advise him that his recent electrical nerve and muscle testing through our office showed evidence of overall mild neuropathy.  He has had symptoms for years as a recall.  He also has evidence of pinched nerve type findings which we call radiculopathy in the lumbar and sacral spine.  There is chronic findings overall with regards to the nerve root impingement, also in keeping with his history of degenerative lumbar spine disease.  Overall, no surprising findings and test results confirm his longstanding history of neuropathy.  As discussed, based on his blood test results we will seek an opinion from hematology, I can see that he is scheduled for early July for consultation.  Other than that, blood tests have not shown any obvious cause or treatable condition causing neuropathy.

## 2019-11-24 NOTE — Telephone Encounter (Signed)
Patient returned your call.

## 2019-11-24 NOTE — Telephone Encounter (Signed)
-----   Message from [name], MD sent at 11/24/2019  8:17 AM EDT ----- Please call patient and advise him that his recent electrical nerve and muscle testing through our office showed evidence of overall mild neuropathy.  He has had symptoms for years as a recall.  He also has evidence of pinched nerve type findings which we call radiculopathy in the lumbar and sacral spine.  There is chronic findings overall with regards to the nerve root impingement, also in keeping with his history of degenerative lumbar spine disease.  Overall, no surprising findings and test results confirm his longstanding history of neuropathy.  As discussed, based on his blood test results we will seek an opinion from hematology, I can see that he is scheduled for early July for consultation.  Other than that, blood tests have not shown any obvious cause or treatable condition causing neuropathy.

## 2019-11-24 NOTE — Telephone Encounter (Signed)
I called pt. No answer, left a message asking pt to call me back.   

## 2019-11-25 NOTE — Telephone Encounter (Signed)
I called the pt back and we were able to review NCS/EMG study. Pt verbalized understand and will proceed with Hematology consult.  Pt did want to notify Dr. [name] he had his f/u with PCP and was started on monthly vit b12 injections along with a thyroid med.. Pt was unsure of dosage/name at the time of call.

## 2019-12-02 ENCOUNTER — Other Ambulatory Visit: Payer: Self-pay | Admitting: Family

## 2019-12-02 DIAGNOSIS — D801 Nonfamilial hypogammaglobulinemia: Secondary | ICD-10-CM

## 2019-12-03 ENCOUNTER — Other Ambulatory Visit: Payer: Self-pay

## 2019-12-03 ENCOUNTER — Encounter: Payer: Self-pay | Admitting: Family

## 2019-12-03 ENCOUNTER — Inpatient Hospital Stay: Payer: Medicare Other

## 2019-12-03 ENCOUNTER — Inpatient Hospital Stay: Payer: Medicare Other | Attending: Family | Admitting: Family

## 2019-12-03 ENCOUNTER — Ambulatory Visit (HOSPITAL_BASED_OUTPATIENT_CLINIC_OR_DEPARTMENT_OTHER)
Admission: RE | Admit: 2019-12-03 | Discharge: 2019-12-03 | Disposition: A | Discharge status: Home / Self Care | Payer: Medicare Other | Source: Ambulatory Visit | Attending: Family Medicine | Admitting: Family Medicine

## 2019-12-03 VITALS — BP 116/65 | HR 70 | Temp 97.9°F | Resp 19 | Ht 74.0 in | Wt 163.0 lb

## 2019-12-03 DIAGNOSIS — Z8042 Family history of malignant neoplasm of prostate: Secondary | ICD-10-CM

## 2019-12-03 DIAGNOSIS — J984 Other disorders of lung: Secondary | ICD-10-CM | POA: Diagnosis not present

## 2019-12-03 DIAGNOSIS — M7989 Other specified soft tissue disorders: Secondary | ICD-10-CM

## 2019-12-03 DIAGNOSIS — D801 Nonfamilial hypogammaglobulinemia: Secondary | ICD-10-CM | POA: Diagnosis not present

## 2019-12-03 DIAGNOSIS — Z9181 History of falling: Secondary | ICD-10-CM

## 2019-12-03 DIAGNOSIS — Z96649 Presence of unspecified artificial hip joint: Secondary | ICD-10-CM

## 2019-12-03 DIAGNOSIS — R946 Abnormal results of thyroid function studies: Secondary | ICD-10-CM | POA: Insufficient documentation

## 2019-12-03 DIAGNOSIS — G629 Polyneuropathy, unspecified: Secondary | ICD-10-CM

## 2019-12-03 DIAGNOSIS — Z574 Occupational exposure to toxic agents in agriculture: Secondary | ICD-10-CM | POA: Diagnosis not present

## 2019-12-03 DIAGNOSIS — Z7982 Long term (current) use of aspirin: Secondary | ICD-10-CM

## 2019-12-03 DIAGNOSIS — Z96659 Presence of unspecified artificial knee joint: Secondary | ICD-10-CM

## 2019-12-03 DIAGNOSIS — Z7952 Long term (current) use of systemic steroids: Secondary | ICD-10-CM

## 2019-12-03 DIAGNOSIS — Z79899 Other long term (current) drug therapy: Secondary | ICD-10-CM | POA: Diagnosis not present

## 2019-12-03 DIAGNOSIS — E039 Hypothyroidism, unspecified: Secondary | ICD-10-CM

## 2019-12-03 DIAGNOSIS — Z7901 Long term (current) use of anticoagulants: Secondary | ICD-10-CM

## 2019-12-03 DIAGNOSIS — I4891 Unspecified atrial fibrillation: Secondary | ICD-10-CM

## 2019-12-03 LAB — CMP (CANCER CENTER ONLY)
ALT: 16 U/L (ref 0–44)
AST: 21 U/L (ref 15–41)
Albumin: 4.2 g/dL (ref 3.5–5.0)
Alkaline Phosphatase: 53 U/L (ref 38–126)
Anion gap: 8 (ref 5–15)
BUN: 23 mg/dL (ref 8–23)
CO2: 28 mmol/L (ref 22–32)
Calcium: 9.7 mg/dL (ref 8.9–10.3)
Chloride: 102 mmol/L (ref 98–111)
Creatinine: 0.77 mg/dL (ref 0.61–1.24)
GFR, Est AFR Am: >60 mL/min (ref >60)
GFR, Estimated: >60 mL/min (ref >60)
Glucose, Bld: 110 mg/dL — ABNORMAL HIGH (ref 70–99)
Potassium: 3.8 mmol/L (ref 3.5–5.1)
Sodium: 138 mmol/L (ref 135–145)
Total Bilirubin: 0.9 mg/dL (ref 0.3–1.2)
Total Protein: 6.1 g/dL — ABNORMAL LOW (ref 6.5–8.1)

## 2019-12-03 LAB — CBC WITH DIFFERENTIAL (CANCER CENTER ONLY)
Abs Immature Granulocytes: 0.33 10*3/uL — ABNORMAL HIGH (ref 0.00–0.07)
Basophils Absolute: 0.1 10*3/uL (ref 0.0–0.1)
Basophils Relative: 0 %
Eosinophils Absolute: 0.1 10*3/uL (ref 0.0–0.5)
Eosinophils Relative: 1 %
HCT: 40.3 % (ref 39.0–52.0)
Hemoglobin: 13.7 g/dL (ref 13.0–17.0)
Immature Granulocytes: 3 %
Lymphocytes Relative: 6 %
Lymphs Abs: 0.7 10*3/uL (ref 0.7–4.0)
MCH: 33.7 pg (ref 26.0–34.0)
MCHC: 34 g/dL (ref 30.0–36.0)
MCV: 99 fL (ref 80.0–100.0)
Monocytes Absolute: 1.1 10*3/uL — ABNORMAL HIGH (ref 0.1–1.0)
Monocytes Relative: 9 %
Neutro Abs: 10.1 10*3/uL — ABNORMAL HIGH (ref 1.7–7.7)
Neutrophils Relative %: 81 %
Platelet Count: 146 10*3/uL — ABNORMAL LOW (ref 150–400)
RBC: 4.07 MIL/uL — ABNORMAL LOW (ref 4.22–5.81)
RDW: 15 % (ref 11.5–15.5)
WBC Count: 12.3 10*3/uL — ABNORMAL HIGH (ref 4.0–10.5)
nRBC: 0 % (ref 0.0–0.2)

## 2019-12-03 LAB — SAVE SMEAR(SSMR), FOR PROVIDER SLIDE REVIEW
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
## 2019-12-04 LAB — IGG, IGA, IGM
IgA: 53 mg/dL — ABNORMAL LOW (ref 61–437)
IgG (Immunoglobin G), Serum: 357 mg/dL — ABNORMAL LOW (ref 603–1613)
IgM (Immunoglobulin M), Srm: 12 mg/dL — ABNORMAL LOW (ref 15–143)

## 2019-12-06 ENCOUNTER — Telehealth: Payer: Self-pay | Admitting: Hematology & Oncology

## 2019-12-06 DIAGNOSIS — L84 Corns and callosities: Secondary | ICD-10-CM | POA: Diagnosis not present

## 2019-12-06 DIAGNOSIS — M79672 Pain in left foot: Secondary | ICD-10-CM | POA: Diagnosis not present

## 2019-12-06 LAB — KAPPA/LAMBDA LIGHT CHAINS
Kappa free light chain: 13.4 mg/L (ref 3.3–19.4)
Kappa, lambda light chain ratio: 1.1 (ref 0.26–1.65)
Lambda free light chains: 12.2 mg/L (ref 5.7–26.3)

## 2019-12-06 LAB — PROTEIN ELECTROPHORESIS, SERUM
A/G Ratio: 1.6 (ref 0.7–1.7)
Albumin ELP: 3.6 g/dL (ref 2.9–4.4)
Alpha-1-Globulin: 0.3 g/dL (ref 0.0–0.4)
Alpha-2-Globulin: 0.7 g/dL (ref 0.4–1.0)
Beta Globulin: 0.8 g/dL (ref 0.7–1.3)
Gamma Globulin: 0.3 g/dL — ABNORMAL LOW (ref 0.4–1.8)
Globulin, Total: 2.2 g/dL (ref 2.2–3.9)
Total Protein ELP: 5.8 g/dL — ABNORMAL LOW (ref 6.0–8.5)

## 2019-12-06 NOTE — Telephone Encounter (Signed)
No los per 7/9. °

## 2019-12-24 DIAGNOSIS — G629 Polyneuropathy, unspecified: Secondary | ICD-10-CM | POA: Diagnosis not present

## 2020-01-12 DIAGNOSIS — M79671 Pain in right foot: Secondary | ICD-10-CM | POA: Diagnosis not present

## 2020-01-12 DIAGNOSIS — M79672 Pain in left foot: Secondary | ICD-10-CM | POA: Diagnosis not present

## 2020-01-12 DIAGNOSIS — L84 Corns and callosities: Secondary | ICD-10-CM | POA: Diagnosis not present

## 2020-01-14 DIAGNOSIS — S81811A Laceration without foreign body, right lower leg, initial encounter: Secondary | ICD-10-CM | POA: Diagnosis not present

## 2020-01-14 DIAGNOSIS — B370 Candidal stomatitis: Secondary | ICD-10-CM | POA: Diagnosis not present

## 2020-01-21 DIAGNOSIS — G629 Polyneuropathy, unspecified: Secondary | ICD-10-CM | POA: Diagnosis not present

## 2020-01-24 DIAGNOSIS — Z23 Encounter for immunization: Secondary | ICD-10-CM | POA: Diagnosis not present

## 2020-01-24 DIAGNOSIS — L03115 Cellulitis of right lower limb: Secondary | ICD-10-CM | POA: Diagnosis not present

## 2020-01-27 ENCOUNTER — Telehealth: Payer: Self-pay | Admitting: *Deleted

## 2020-01-27 NOTE — Telephone Encounter (Signed)
Received a call from patient stating that he has had a lung infection, cellulitis twice and a hand infection since we last saw him.  Dr Marin Olp notified.  Wants to see patient in office with lab and MD appointment.  Patient notified.  Scheduling message sent

## 2020-02-01 DIAGNOSIS — L03115 Cellulitis of right lower limb: Secondary | ICD-10-CM | POA: Diagnosis not present

## 2020-02-03 DIAGNOSIS — M1811 Unilateral primary osteoarthritis of first carpometacarpal joint, right hand: Secondary | ICD-10-CM | POA: Diagnosis not present

## 2020-02-03 DIAGNOSIS — M1812 Unilateral primary osteoarthritis of first carpometacarpal joint, left hand: Secondary | ICD-10-CM | POA: Diagnosis not present

## 2020-02-07 ENCOUNTER — Other Ambulatory Visit: Payer: Self-pay | Admitting: Cardiology

## 2020-02-08 ENCOUNTER — Telehealth: Payer: Self-pay | Admitting: *Deleted

## 2020-02-08 NOTE — Telephone Encounter (Signed)
Called [facility] @ [phone].  Received Urgent Fax about Pt's omeprazole (40 mg) unclear if it is bid or daily. This was for Kathrene Alu.  Cecille Rubin has never prescribed this medication.  Amy Esterwood, PA prescribed this medication.  Gave NPI number due to this being on the last script and phone number.

## 2020-02-11 ENCOUNTER — Telehealth: Payer: Self-pay | Admitting: Internal Medicine

## 2020-02-11 MED ORDER — OMEPRAZOLE 40 MG PO CPDR: 40.0000 mg | DELAYED_RELEASE_CAPSULE | Freq: Two times a day (BID) | ORAL | 11 refills | Status: DC

## 2020-02-11 NOTE — Telephone Encounter (Signed)
Omeprazole refilled as patient requested.

## 2020-02-11 NOTE — Telephone Encounter (Signed)
Patient requesting refill on Omeprazole 

## 2020-02-18 DIAGNOSIS — M5442 Lumbago with sciatica, left side: Secondary | ICD-10-CM | POA: Diagnosis not present

## 2020-02-18 DIAGNOSIS — Z23 Encounter for immunization: Secondary | ICD-10-CM | POA: Diagnosis not present

## 2020-02-18 DIAGNOSIS — M545 Low back pain: Secondary | ICD-10-CM | POA: Diagnosis not present

## 2020-02-18 DIAGNOSIS — R296 Repeated falls: Secondary | ICD-10-CM | POA: Diagnosis not present

## 2020-02-18 DIAGNOSIS — M25552 Pain in left hip: Secondary | ICD-10-CM | POA: Diagnosis not present

## 2020-02-18 DIAGNOSIS — G629 Polyneuropathy, unspecified: Secondary | ICD-10-CM | POA: Diagnosis not present

## 2020-02-19 ENCOUNTER — Other Ambulatory Visit: Payer: Self-pay | Admitting: Cardiology

## 2020-02-24 NOTE — Telephone Encounter (Signed)
I called the insurance company and they are faxing over a form, it's a # limit issue , they cover one a day.

## 2020-02-24 NOTE — Telephone Encounter (Signed)
Has not gotten the omeprazole medication refill yet. Would like to speak to nurse please.

## 2020-02-24 NOTE — Telephone Encounter (Signed)
I called the pharmacy and he picked up his omeprazole on 02/14/2020. He just needs me to do a prior authorization for his next refill. He is going to have [employer] over a form.

## 2020-02-24 NOTE — Telephone Encounter (Signed)
Pt called to inform that insurance told her that we need to contact them directly to give them our fax number. It cannot come from the pt. Pls call [phone] [facility] [employer].

## 2020-02-25 ENCOUNTER — Other Ambulatory Visit: Payer: Self-pay | Admitting: *Deleted

## 2020-02-25 DIAGNOSIS — D801 Nonfamilial hypogammaglobulinemia: Secondary | ICD-10-CM

## 2020-02-28 ENCOUNTER — Encounter (HOSPITAL_BASED_OUTPATIENT_CLINIC_OR_DEPARTMENT_OTHER): Payer: Self-pay | Admitting: *Deleted

## 2020-02-28 ENCOUNTER — Encounter: Payer: Self-pay | Admitting: Hematology & Oncology

## 2020-02-28 ENCOUNTER — Other Ambulatory Visit: Payer: Self-pay

## 2020-02-28 ENCOUNTER — Emergency Department (HOSPITAL_BASED_OUTPATIENT_CLINIC_OR_DEPARTMENT_OTHER)
Admission: EM | Admit: 2020-02-28 | Discharge: 2020-02-29 | Disposition: A | Discharge status: Home / Self Care | Payer: Medicare Other | Attending: Emergency Medicine | Admitting: Emergency Medicine

## 2020-02-28 ENCOUNTER — Inpatient Hospital Stay: Payer: Medicare Other | Attending: Family | Admitting: Hematology & Oncology

## 2020-02-28 ENCOUNTER — Other Ambulatory Visit: Payer: Self-pay | Admitting: *Deleted

## 2020-02-28 ENCOUNTER — Inpatient Hospital Stay: Payer: Medicare Other

## 2020-02-28 VITALS — BP 130/90 | HR 83 | Temp 97.9°F | Resp 20 | Wt 163.4 lb

## 2020-02-28 DIAGNOSIS — R895 Abnormal microbiological findings in specimens from other organs, systems and tissues: Secondary | ICD-10-CM

## 2020-02-28 DIAGNOSIS — S81801A Unspecified open wound, right lower leg, initial encounter: Secondary | ICD-10-CM

## 2020-02-28 DIAGNOSIS — M7989 Other specified soft tissue disorders: Secondary | ICD-10-CM

## 2020-02-28 DIAGNOSIS — Z96643 Presence of artificial hip joint, bilateral: Secondary | ICD-10-CM | POA: Diagnosis not present

## 2020-02-28 DIAGNOSIS — J4540 Moderate persistent asthma, uncomplicated: Secondary | ICD-10-CM | POA: Diagnosis not present

## 2020-02-28 DIAGNOSIS — Z96653 Presence of artificial knee joint, bilateral: Secondary | ICD-10-CM | POA: Diagnosis not present

## 2020-02-28 DIAGNOSIS — Z7982 Long term (current) use of aspirin: Secondary | ICD-10-CM | POA: Diagnosis not present

## 2020-02-28 DIAGNOSIS — D801 Nonfamilial hypogammaglobulinemia: Secondary | ICD-10-CM

## 2020-02-28 DIAGNOSIS — Z96612 Presence of left artificial shoulder joint: Secondary | ICD-10-CM | POA: Diagnosis not present

## 2020-02-28 LAB — CBC WITH DIFFERENTIAL (CANCER CENTER ONLY)
Abs Immature Granulocytes: 0.26 10*3/uL — ABNORMAL HIGH (ref 0.00–0.07)
Basophils Absolute: 0 10*3/uL (ref 0.0–0.1)
Basophils Relative: 0 %
Eosinophils Absolute: 0 10*3/uL (ref 0.0–0.5)
Eosinophils Relative: 0 %
HCT: 36.9 % — ABNORMAL LOW (ref 39.0–52.0)
Hemoglobin: 12.4 g/dL — ABNORMAL LOW (ref 13.0–17.0)
Immature Granulocytes: 2 %
Lymphocytes Relative: 9 %
Lymphs Abs: 0.9 10*3/uL (ref 0.7–4.0)
MCH: 33.3 pg (ref 26.0–34.0)
MCHC: 33.6 g/dL (ref 30.0–36.0)
MCV: 99.2 fL (ref 80.0–100.0)
Monocytes Absolute: 0.8 10*3/uL (ref 0.1–1.0)
Monocytes Relative: 8 %
Neutro Abs: 8.6 10*3/uL — ABNORMAL HIGH (ref 1.7–7.7)
Neutrophils Relative %: 81 %
Platelet Count: 183 10*3/uL (ref 150–400)
RBC: 3.72 MIL/uL — ABNORMAL LOW (ref 4.22–5.81)
RDW: 14.1 % (ref 11.5–15.5)
WBC Count: 10.7 10*3/uL — ABNORMAL HIGH (ref 4.0–10.5)
nRBC: 0 % (ref 0.0–0.2)

## 2020-02-28 LAB — CMP (CANCER CENTER ONLY)
ALT: 15 U/L (ref 0–44)
AST: 19 U/L (ref 15–41)
Albumin: 3.9 g/dL (ref 3.5–5.0)
Alkaline Phosphatase: 53 U/L (ref 38–126)
Anion gap: 10 (ref 5–15)
BUN: 20 mg/dL (ref 8–23)
CO2: 26 mmol/L (ref 22–32)
Calcium: 9.6 mg/dL (ref 8.9–10.3)
Chloride: 100 mmol/L (ref 98–111)
Creatinine: 0.9 mg/dL (ref 0.61–1.24)
GFR, Est AFR Am: >60 mL/min (ref >60)
GFR, Estimated: >60 mL/min (ref >60)
Glucose, Bld: 107 mg/dL — ABNORMAL HIGH (ref 70–99)
Potassium: 4.1 mmol/L (ref 3.5–5.1)
Sodium: 136 mmol/L (ref 135–145)
Total Bilirubin: 0.6 mg/dL (ref 0.3–1.2)
Total Protein: 5.8 g/dL — ABNORMAL LOW (ref 6.5–8.1)

## 2020-02-28 MED ORDER — DOXYCYCLINE HYCLATE 100 MG PO CAPS: 100.0000 mg | ORAL_CAPSULE | Freq: Two times a day (BID) | ORAL | 0 refills | Status: AC

## 2020-02-28 MED ORDER — ENOXAPARIN SODIUM 80 MG/0.8ML ~~LOC~~ SOLN
1.0000 mg/kg | Freq: Once | SUBCUTANEOUS | Status: AC
  Administered 2020-02-28: 75 mg via SUBCUTANEOUS
  Filled 2020-02-28: qty 0.8

## 2020-02-28 NOTE — Progress Notes (Signed)
Hematology and Oncology Follow Up Visit  Levi [name] [id number] 19-Nov-1945 74 y.o. 02/28/2020   Principle Diagnosis:   Hypogammaglobulinemia -- non-healing ulcer of RIGHT leg  Current Therapy:    Observation     Interim History:  Levi [name] is back for his second office visit.  I am surprised that he is back to see [place].  We really do not need to see him back.  However, somehow he got an appointment to see [place].  He has had this ulcer on the right lower leg that seems to be nonhealing.  I think the real problem with this is that he is on prednisone.  He is on prednisone 20 mg a day.  His right lower leg is swollen.  There is some erythema.  He has had a hard time walking.  I am not sure if he has cellulitis or if he has an underlying thrombus.  We first saw him back in July, his work-up was pretty much unremarkable.  There is no hematologic malignancy.  He has mildly suppressed IgG level of 357 mg/dL.  His IgA level was 53 mg/dL.  He had no monoclonal spike in his blood.  There is no monoclonal light chains.  He has been on different antibiotics.  His ulcer is never been cultured from what he says.  I did take a culture of this ulcer.  He has had no bleeding.  He has had no weight loss.  He has had no fever.  I saw him in the office today.  I was not sure exactly what was going on.  As such, I was worried that there might be an underlying thrombus or possibly cellulitis.  We got him down to the emergency room for a better evaluation.  Currently, his performance status is ECOG 1.  Medications:  Current Outpatient Medications:  .  albuterol (VENTOLIN HFA) 108 (90 Base) MCG/ACT inhaler, Inhale 2 puffs into the lungs every 4 (four) hours as needed for wheezing or shortness of breath., Disp: 18 g, Rfl: 12 .  allopurinol (ZYLOPRIM) 100 MG tablet, Take 100 mg by mouth daily.  , Disp: , Rfl:  .  aspirin EC 81 MG tablet, Take 1 tablet (81 mg total) by mouth daily., Disp: 90 tablet, Rfl:  3 .  atorvastatin (LIPITOR) 20 MG tablet, TAKE 1 TABLET BY MOUTH EVERY DAY, Disp: 90 tablet, Rfl: 0 .  candesartan (ATACAND) 4 MG tablet, TAKE 1/2 TABLETS (2 MG TOTAL) BY MOUTH DAILY., Disp: 45 tablet, Rfl: 3 .  cyclobenzaprine (FLEXERIL) 10 MG tablet, Take 10 mg by mouth at bedtime as needed for muscle spasms., Disp: , Rfl:  .  diltiazem (CARDIZEM CD) 180 MG 24 hr capsule, TAKE 1 CAPSULE BY MOUTH EVERY DAY, Disp: 90 capsule, Rfl: 3 .  furosemide (LASIX) 20 MG tablet, Take 1 tablet (20 mg total) by mouth daily as needed. For shortness of breath and lower limb edema, Disp: 90 tablet, Rfl: 3 .  ipratropium-albuterol (DUONEB) 0.5-2.5 (3) MG/3ML SOLN, 1 NEBULE EVERY 6 HOURS AS NEEDED. **J45.3**, Disp: 270 mL, Rfl: 4 .  isosorbide mononitrate (IMDUR) 30 MG 24 hr tablet, TAKE 1 TABLET (30 MG TOTAL) BY MOUTH DAILY., Disp: 30 tablet, Rfl: 11 .  Nebulizers (COMPRESSOR/NEBULIZER) MISC, Use as directed, Disp: 1 each, Rfl: 0 .  nystatin (MYCOSTATIN) 100000 UNIT/ML suspension, Take 5 mLs by mouth 4 (four) times daily., Disp: , Rfl:  .  omeprazole (PRILOSEC) 40 MG capsule, Take 1 capsule (40 mg total) by  mouth 2 (two) times daily before a meal., Disp: 60 capsule, Rfl: 11 .  predniSONE (DELTASONE) 10 MG tablet, TAKE 1 TABLET (10 MG TOTAL) BY MOUTH DAILY. CONTINUOUS (Patient taking differently: Take 20 mg by mouth daily. continuous), Disp: 30 tablet, Rfl: 5 .  STRIVERDI RESPIMAT 2.5 MCG/ACT AERS, INHALE 2 PUFFS BY MOUTH INTO THE LUNGS DAILY, Disp: 4 g, Rfl: 12 .  tamsulosin (FLOMAX) 0.4 MG CAPS capsule, Take 0.4 mg by mouth at bedtime. , Disp: , Rfl: 11  Allergies:  Allergies  Allergen Reactions  . Morphine And Related Palpitations and Other (See Comments)    Made patient light-headed and sweaty; "my heart races" (04/28/2012), pt states too much morphine gave him this reaction  . Levaquin [Levofloxacin] Itching  . Fluticasone-Salmeterol Other (See Comments)    Patient somewhat disputes this (??)    Past  Medical History, Surgical history, Social history, and Family History were reviewed and updated.  Review of Systems: Review of Systems  Constitutional: Negative.   HENT:  Negative.   Eyes: Negative.   Respiratory: Negative.   Cardiovascular: Negative.   Gastrointestinal: Negative.   Endocrine: Negative.   Genitourinary: Negative.    Musculoskeletal: Negative.   Skin: Positive for wound.  Neurological: Negative.   Hematological: Negative.   Psychiatric/Behavioral: Negative.     Physical Exam:  weight is 163 lb 6.4 oz (74.1 kg). His oral temperature is 97.9 F (36.6 C). His blood pressure is 130/90 and his pulse is 83. His respiration is 20 and oxygen saturation is 98%.   Wt Readings from Last 3 Encounters:  02/28/20 163 lb 5.8 oz (74.1 kg)  02/28/20 163 lb 6.4 oz (74.1 kg)  12/03/19 163 lb (73.9 kg)    Physical Exam Vitals reviewed.  HENT:     Head: Normocephalic and atraumatic.  Eyes:     Pupils: Pupils are equal, round, and reactive to light.  Cardiovascular:     Rate and Rhythm: Normal rate and regular rhythm.     Heart sounds: Normal heart sounds.  Pulmonary:     Effort: Pulmonary effort is normal.     Breath sounds: Normal breath sounds.  Abdominal:     General: Bowel sounds are normal.     Palpations: Abdomen is soft.  Musculoskeletal:        General: No tenderness or deformity. Normal range of motion.     Cervical back: Normal range of motion.     Comments: His extremities show some swelling of the right lower leg.  He has this ulcer that is about 1-1/2 cm in diameter.  It is a little bit deep.  There is some exudate.  There is some surrounding erythema.  There is some slight erythema of the right lower leg.  It is hard to palpate any obvious venous cord in the right leg.  Left leg is unremarkable.  Lymphadenopathy:     Cervical: No cervical adenopathy.  Skin:    General: Skin is warm and dry.     Findings: No erythema or rash.  Neurological:     Mental  Status: He is alert and oriented to person, place, and time.  Psychiatric:        Behavior: Behavior normal.        Thought Content: Thought content normal.        Judgment: Judgment normal.    Lab Results  Component Value Date   WBC 10.7 (H) 02/28/2020   HGB 12.4 (L) 02/28/2020   HCT 36.9 (L)  02/28/2020   MCV 99.2 02/28/2020   PLT 183 02/28/2020     Chemistry      Component Value Date/Time   NA 136 02/28/2020 1440   NA 140 10/21/2019 0941   K 4.1 02/28/2020 1440   CL 100 02/28/2020 1440   CO2 26 02/28/2020 1440   BUN 20 02/28/2020 1440   BUN 24 10/21/2019 0941   CREATININE 0.90 02/28/2020 1440      Component Value Date/Time   CALCIUM 9.6 02/28/2020 1440   ALKPHOS 53 02/28/2020 1440   AST 19 02/28/2020 1440   ALT 15 02/28/2020 1440   BILITOT 0.6 02/28/2020 1440      Impression and Plan: Levi [name] is a very nice 74 year old white male.  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  He was in [place].  I think there is Agent Orange exposure.  I do worry about this ulcer in the right lower leg.  It bothers me that this is nonhealing.  I just have a hard time believing this is because of low immunoglobulins.  I think this more likely secondary to him being on steroids.  I do not know if there is a blood clot in the leg.  I think is going had to be checked for this.  It would not surprise me if he is going to be admitted.  I am so glad and thankful that the emergency room is going to be able to see him.  I know that they will do a thorough work-up on him.   Volanda Napoleon, MD 10/4/20215:16 PM

## 2020-02-28 NOTE — ED Triage Notes (Signed)
Wound to his right lower leg since July. Lesion to his leg with redness and swelling noted.

## 2020-02-28 NOTE — Discharge Instructions (Signed)
Please follow up tomorrow morning as instructed for a blood clot scan of your right leg.  I prescribed a 7 day course of doxycycline for a possible leg infection.

## 2020-02-28 NOTE — ED Provider Notes (Signed)
[name] Provider Note   CSN: [id number] Arrival date & time: 02/28/20  1609     History Chief Complaint  Patient presents with  . Wound Check    Levi [name] is a 74 y.o. male presenting to ED with right leg swelling.  Patient seen at oncology office today and referred to ED for persistent recurring right leg swelling for DVT vs infection evaluation.  He tells me he has had issues with his right lower leg swelling for "months."  He has been on multiple rounds of antibiotics, and reports improvement after each course, but subsequent pain and swelling afterwards.  He denies hx of DVT.  He reports he has no pain in his right leg with movement or weight bearing, but the skin is tender to touch.  He has a small chronic ulcer on this leg.  He denies fevers, chills, CP, or SOB.  He is very active and exercises regularly.  HPI     Past Medical History:  Diagnosis Date  . Agent orange exposure 1970's   takes Imdur,Diltiazem, and Atacand daily  . ALLERGIC RHINITIS   . Asthma   . Bruises easily    d/t prednisone daily  . Chronic bronchitis ([facility])    "used to get it q yr; last time ?2008" (04/28/2012)  . COPD (chronic obstructive pulmonary disease) ([facility])    agent orange exposure  . Degenerative disk disease    "qwhere" (04/28/2012)  . Diverticulitis   . Dysrhythmia    afib  . Elevated uric acid in blood    takes Allopurinol daily  . Enlarged prostate    but not on any meds  . GERD (gastroesophageal reflux disease)    takes Protonix daily  . H/O hiatal hernia   . Headache   . History of pneumonia    a. 2010  . Hyperlipidemia    takes Lipitor daily; pt. states he takes a preventive  . Joint pain   . Joint swelling   . PAF (paroxysmal atrial fibrillation) ([facility])    a. Dx 04/2012, CHA2DS2VASc = 1 (age);  b. 04/2012 Echo: EF 40-50%, mild MR.  . Personal history of colonic adenomas 02/09/2013  . Shortness of breath dyspnea     Patient Active  Problem List   Diagnosis Date Noted  . Chest wall pain 09/21/2019  . GERD (gastroesophageal reflux disease) 12/03/2017  . Pneumothorax, left 04/11/2017  . Lumbar stenosis with neurogenic claudication 04/07/2017  . Primary osteoarthritis of right hip 07/31/2015  . Primary osteoarthritis of right knee 06/17/2014  . Thrush of mouth and esophagus ([facility]) 04/03/2014  . Palpitation 03/28/2014  . Pseudoarthrosis of lumbar spine 06/15/2013  . Hyperlipidemia   . History of colonic polyps 02/09/2013  . Atrial fibrillation ([facility]) 05/11/2012  . Osteoarthritis of left knee 01/10/2012  . Seasonal and perennial allergic rhinitis 07/23/2010  . Asthma, moderate persistent 03/06/2009    Past Surgical History:  Procedure Laterality Date  . ANTERIOR CERVICAL DECOMP/DISCECTOMY FUSION N/A 07/21/2012   Procedure: ANTERIOR CERVICAL DECOMPRESSION/DISCECTOMY FUSION 2 LEVELS;  Surgeon: [name], MD;  Location: [facility];  Service: Neurosurgery;  Laterality: N/A;  C4-5 C5-6 Anterior cervical decompression/diskectomy/fusion  . ANTERIOR LAT LUMBAR FUSION N/A 04/07/2017   Procedure: Thoracic twelve-Lumbar one Anterolateral decompression/fusion;  Surgeon: [name], MD;  Location: [facility];  Service: Neurosurgery;  Laterality: N/A;  . BACK SURGERY     x 3  . CARDIAC CATHETERIZATION  11/2008   [name] - 20% calcified  non flow limiting left main, 50% EF apical hypokinesis  . CERVICAL DISC ARTHROPLASTY N/A 04/07/2017   Procedure: Cervical six-seven Disc arthroplasty;  Surgeon: [name], MD;  Location: [facility];  Service: Neurosurgery;  Laterality: N/A;  . CHEST TUBE INSERTION Left 04/11/2017   Procedure: CHEST TUBE INSERTION;  Surgeon: [name], MD;  Location: [facility];  Service: Thoracic;  Laterality: Left;  . CHOLECYSTECTOMY    . COLONOSCOPY    . ESOPHAGOGASTRODUODENOSCOPY    . EYE SURGERY Bilateral 2011   steroidal encapsulation" (04/28/2012)  . FOOT SURGERY Right    x 2  . KNEE ARTHROSCOPY  Bilateral   . LATERAL / POSTERIOR COMBINED FUSION LUMBAR SPINE  2012  . REVERSE SHOULDER ARTHROPLASTY  04/28/2012   Procedure: REVERSE SHOULDER ARTHROPLASTY;  Surgeon: [name], MD;  Location: [facility];  Service: Orthopedics;  Laterality: Left;  Left shouder reverse total shoulder arthroplasty  . SHOULDER SURGERY    . [place]  . TOTAL HIP ARTHROPLASTY Left 2011   "left" (04/28/2012)  . TOTAL HIP ARTHROPLASTY Right 07/31/2015   Procedure: TOTAL HIP ARTHROPLASTY ANTERIOR APPROACH;  Surgeon: [name], MD;  Location: [facility];  Service: Orthopedics;  Laterality: Right;  . TOTAL KNEE ARTHROPLASTY  01/10/2012   Procedure: TOTAL KNEE ARTHROPLASTY;  Surgeon: [name], MD;  Location: [facility];  Service: Orthopedics;;  left total knee arthroplasty  . TOTAL KNEE ARTHROPLASTY Right 06/17/2014   Procedure: TOTAL KNEE ARTHROPLASTY;  Surgeon: [name], MD;  Location: [facility];  Service: Orthopedics;  Laterality: Right;       Family History  Problem Relation Age of Onset  . Heart disease Father        Died 74, MI  . Diabetes Mother   . Prostate cancer Brother   . Colon cancer Neg Hx   . Esophageal cancer Neg Hx   . Rectal cancer Neg Hx   . Stomach cancer Neg Hx   . Pancreatic cancer Neg Hx   . Kidney disease Neg Hx   . Liver disease Neg Hx     Social History   Tobacco Use  . Smoking status: Never Smoker  . Smokeless tobacco: Never Used  Vaping Use  . Vaping Use: Never used  Substance Use Topics  . Alcohol use: No    Alcohol/week: 0.0 standard drinks  . Drug use: No    Home Medications Prior to Admission medications   Medication Sig Start Date End Date Taking? Authorizing Provider  albuterol (VENTOLIN HFA) 108 (90 Base) MCG/ACT inhaler Inhale 2 puffs into the lungs every 4 (four) hours as needed for wheezing or shortness of breath. 09/21/19   Baird Lyons D, MD  allopurinol (ZYLOPRIM) 100 MG tablet Take 100 mg by mouth daily.      [provider]  aspirin EC 81 MG tablet Take 1 tablet (81 mg total) by mouth daily. 10/10/15   Jerline Pain, MD  atorvastatin (LIPITOR) 20 MG tablet TAKE 1 TABLET BY MOUTH EVERY DAY 02/22/20   Jerline Pain, MD  candesartan (ATACAND) 4 MG tablet TAKE 1/2 TABLETS (2 MG TOTAL) BY MOUTH DAILY. 05/06/19   Jerline Pain, MD  cyclobenzaprine (FLEXERIL) 10 MG tablet Take 10 mg by mouth at bedtime as needed for muscle spasms.    [provider]  diltiazem (CARDIZEM CD) 180 MG 24 hr capsule TAKE 1 CAPSULE BY MOUTH EVERY DAY 09/28/19   Burtis Junes, NP  doxycycline (VIBRAMYCIN)  100 MG capsule Take 1 capsule (100 mg total) by mouth 2 (two) times daily for 7 days. 02/28/20 03/06/20  Wyvonnia Dusky, MD  furosemide (LASIX) 20 MG tablet Take 1 tablet (20 mg total) by mouth daily as needed. For shortness of breath and lower limb edema 11/17/18   Jerline Pain, MD  ipratropium-albuterol (DUONEB) 0.5-2.5 (3) MG/3ML SOLN 1 NEBULE EVERY 6 HOURS AS NEEDED. **J45.3** 09/07/18   Young, Tarri Fuller D, MD  isosorbide mononitrate (IMDUR) 30 MG 24 hr tablet TAKE 1 TABLET (30 MG TOTAL) BY MOUTH DAILY. 02/08/20   Jerline Pain, MD  Nebulizers (COMPRESSOR/NEBULIZER) MISC Use as directed 12/01/17   Deneise Lever, MD  nystatin (MYCOSTATIN) 100000 UNIT/ML suspension Take 5 mLs by mouth 4 (four) times daily. 02/11/20   [provider]  omeprazole (PRILOSEC) 40 MG capsule Take 1 capsule (40 mg total) by mouth 2 (two) times daily before a meal. 02/11/20   Gatha Mayer, MD  predniSONE (DELTASONE) 10 MG tablet TAKE 1 TABLET (10 MG TOTAL) BY MOUTH DAILY. CONTINUOUS Patient taking differently: Take 20 mg by mouth daily. continuous 10/26/19   Baird Lyons D, MD  STRIVERDI RESPIMAT 2.5 MCG/ACT AERS INHALE 2 PUFFS BY MOUTH INTO THE LUNGS DAILY 05/03/19   Baird Lyons D, MD  tamsulosin (FLOMAX) 0.4 MG CAPS capsule Take 0.4 mg by mouth at bedtime.  10/13/15   [provider]    Allergies    Morphine and  related, Levaquin [levofloxacin], and Fluticasone-salmeterol  Review of Systems   Review of Systems  Constitutional: Negative for chills and fever.  HENT: Negative for ear pain and sore throat.   Eyes: Negative for pain and visual disturbance.  Respiratory: Negative for cough and shortness of breath.   Cardiovascular: Negative for chest pain and palpitations.  Gastrointestinal: Negative for abdominal pain and vomiting.  Genitourinary: Negative for dysuria and hematuria.  Musculoskeletal: Negative for arthralgias and myalgias.  Skin: Positive for rash and wound.  Neurological: Negative for syncope, weakness, light-headedness and numbness.  All other systems reviewed and are negative.   Physical Exam Updated Vital Signs BP 127/78 (BP Location: Right Arm)   Pulse 63   Temp 98.1 F (36.7 C) (Oral)   Resp 17   Ht 6\' 2"  (1.88 m)   Wt 74.1 kg   SpO2 100%   BMI 20.97 kg/m   Physical Exam Vitals and nursing note reviewed.  Constitutional:      Appearance: He is well-developed.  HENT:     Head: Normocephalic and atraumatic.  Eyes:     Conjunctiva/sclera: Conjunctivae normal.  Cardiovascular:     Rate and Rhythm: Normal rate and regular rhythm.     Pulses: Normal pulses.     Comments: +2 pedal pulses bilaterally Pulmonary:     Effort: Pulmonary effort is normal. No respiratory distress.  Musculoskeletal:     Cervical back: Neck supple.  Skin:    General: Skin is warm and dry.     Comments: Swelling of the right lower leg below the knee, with red discoloration of the leg, mild ttp, no crepitus, some posterior tenderness of the calf on lower leg, no palpable cord.  Small ulceration on medial aspect of right leg that is not weeping or draining.  Neurological:     General: No focal deficit present.     Mental Status: He is alert and oriented to person, place, and time.     Sensory: No sensory deficit.     Motor:  No weakness.  Psychiatric:        Mood and Affect: Mood normal.         Behavior: Behavior normal.     ED Results / Procedures / Treatments   Labs (all labs ordered are listed, but only abnormal results are displayed) Labs Reviewed - No data to display  EKG None  Radiology No results found.  Procedures Procedures (including critical care time)  Medications Ordered in ED Medications  enoxaparin (LOVENOX) injection 75 mg (75 mg Subcutaneous Given 02/28/20 2321)    ED Course  I have reviewed the triage vital signs and the nursing notes.  Pertinent labs & imaging results that were available during my care of the patient were reviewed by me and considered in my medical decision making (see chart for details).  DDx includes recurrent cellulitis or infection vs venous stasis dermatitive vs DVT vs other  Plan to treat with lovenox, have him return in AM for DVT study, and give 7 day course of doxy.  He states oncologist obtained wound culture sample.    Doubt nec fasc or fracture at this time.     Final Clinical Impression(s) / ED Diagnoses Final diagnoses:  Right leg swelling    Rx / DC Orders ED Discharge Orders         Ordered    US Venous Img Lower Unilateral Right        02/28/20 2310    doxycycline (VIBRAMYCIN) 100 MG capsule  2 times daily        02/28/20 2312           Wyvonnia Dusky, MD 02/29/20 [phone]

## 2020-02-29 ENCOUNTER — Telehealth: Payer: Self-pay | Admitting: Hematology & Oncology

## 2020-02-29 ENCOUNTER — Emergency Department (HOSPITAL_BASED_OUTPATIENT_CLINIC_OR_DEPARTMENT_OTHER)
Admit: 2020-02-29 | Discharge: 2020-02-29 | Disposition: A | Discharge status: Home / Self Care | Payer: Medicare Other | Attending: Emergency Medicine | Admitting: Emergency Medicine

## 2020-02-29 DIAGNOSIS — R600 Localized edema: Secondary | ICD-10-CM | POA: Diagnosis not present

## 2020-02-29 DIAGNOSIS — M7989 Other specified soft tissue disorders: Secondary | ICD-10-CM | POA: Diagnosis not present

## 2020-02-29 DIAGNOSIS — M79604 Pain in right leg: Secondary | ICD-10-CM | POA: Diagnosis not present

## 2020-02-29 NOTE — Telephone Encounter (Signed)
No los 10/4

## 2020-03-01 NOTE — Telephone Encounter (Signed)
I called the insurance company back as I had not seen the fax. They said they are working on it. Apparently the form got faxed over and was done under Ellouise Newer, PA-C. Will await the outcome, they are to fax [place] the decision.

## 2020-03-02 ENCOUNTER — Telehealth: Payer: Self-pay | Admitting: *Deleted

## 2020-03-02 DIAGNOSIS — M1812 Unilateral primary osteoarthritis of first carpometacarpal joint, left hand: Secondary | ICD-10-CM | POA: Diagnosis not present

## 2020-03-02 DIAGNOSIS — M1811 Unilateral primary osteoarthritis of first carpometacarpal joint, right hand: Secondary | ICD-10-CM | POA: Diagnosis not present

## 2020-03-02 LAB — AEROBIC CULTURE W GRAM STAIN (SUPERFICIAL SPECIMEN): Gram Stain: NONE SEEN

## 2020-03-02 MED ORDER — CIPROFLOXACIN HCL 500 MG PO TABS: 500.0000 mg | ORAL_TABLET | Freq: Two times a day (BID) | ORAL | 0 refills | Status: DC

## 2020-03-02 NOTE — Telephone Encounter (Signed)
Notified pt per MD, stop Doxycycline. Begin Cipro 500mg  BID. Rx sent to [facility]. Encouraged pt to call with any concerns. Pt verbalized understanding.

## 2020-03-06 ENCOUNTER — Encounter: Payer: Self-pay | Admitting: Internal Medicine

## 2020-03-07 DIAGNOSIS — M5416 Radiculopathy, lumbar region: Secondary | ICD-10-CM | POA: Diagnosis not present

## 2020-03-07 DIAGNOSIS — L03115 Cellulitis of right lower limb: Secondary | ICD-10-CM | POA: Diagnosis not present

## 2020-03-07 DIAGNOSIS — M5442 Lumbago with sciatica, left side: Secondary | ICD-10-CM | POA: Diagnosis not present

## 2020-03-10 DIAGNOSIS — L84 Corns and callosities: Secondary | ICD-10-CM | POA: Diagnosis not present

## 2020-03-10 DIAGNOSIS — M79671 Pain in right foot: Secondary | ICD-10-CM | POA: Diagnosis not present

## 2020-03-15 NOTE — Progress Notes (Signed)
CARDIOLOGY OFFICE NOTE  Date:  03/28/2020    Levi [name] Date of Birth: 08-07-1945 Medical Record #[id number]  PCP:  Orpah Melter, MD  Cardiologist:  Marisa Cyphers  Chief Complaint  Patient presents with  . Follow-up    Seen for [name]    History of Present Illness: Levi [name] is a 74 y.o. male who presents today for a 6 month check. Seen for [name].   He has a history of PAFfirst notedin December of 2013then again in 2016. He is on low dose CCB therapy and low dose aspirin.Echocardiogram demonstrated EF in the 50% range. Prior catheterization in 2010 showed no flow-limiting CAD.Other issues include COPD, spine surgery, occasional palpitation.He has had agent orange exposure.He had a prior lumbar fusion in 9735 complicated by PTX requiring chest tube placement. He has had CHADSVASC of 1 - has wanted to wait until over 76 to start anticoagulation if needed.I saw him back in December of 2019. He had had his echo updated - EF 40 to 45%. No beta blocker due to COPD. Intermittent swelling - felt to be due to CCB therapy. Using Lasix prn. Last seen by [name] in January of 2020. Cardiac status ok - had had a bout of ?vertigo and saw neurology with MRI.Remote PAF from 2013 noted.   I saw himin June fora telehealth visit. - he was using more Lasix for lower extremity edema. Had seen pulmonary who was concerned about his diuretic use and low BP. He was getting ready to have PFTs.CXR was done.Was not dizzy. Breathing probably a little worse.We sent him for labshere- BNP totally normal.Then seen in the office here in July - he was doing better - swelling had improved - using less Lasix. Very little AF per his report.He has had more back/neck issues. Last seen back in May - cardiac status ok - but more issues with his neuropathy - falling - broke his hip and had a knee fracture.   Comes in today. Here alone. He is doing ok. Was in the ER in about a month  ago with swelling of the right leg - concern for DVT vs infection. Has had multiple rounds of antibiotics with subsequent return of symptoms. No DVT on doppler but did have moderate amount of subcutaneous edema in the right lower leg. He has had a positive wound culture - he has had a prolonged course of antibiotics with Cipro. He says it is finally getting better. He is support stockings as well per orthopedics. His breathing is ok - stable for him - just saw pulmonary and had a good report.  Lots of ortho issues. He has had some right sided chest pain - nothing exertional - just comes and goes. Fleeting.  Has had for several months - not getting worse. He is not too concerned about it. Not dizzy. Balance remains poor. Neuropathy is pretty significant.   Past Medical History:  Diagnosis Date  . Agent orange exposure 1970's   takes Imdur,Diltiazem, and Atacand daily  . ALLERGIC RHINITIS   . Asthma   . Bruises easily    d/t prednisone daily  . Chronic bronchitis ([place])    "used to get it q yr; last time ?2008" (04/28/2012)  . COPD (chronic obstructive pulmonary disease) ([facility])    agent orange exposure  . Degenerative disk disease    "qwhere" (04/28/2012)  . Diverticulitis   . Dysrhythmia    afib  . Elevated uric acid in  blood    takes Allopurinol daily  . Enlarged prostate    but not on any meds  . GERD (gastroesophageal reflux disease)    takes Protonix daily  . H/O hiatal hernia   . Headache   . History of pneumonia    a. 2010  . Hyperlipidemia    takes Lipitor daily; pt. states he takes a preventive  . Joint pain   . Joint swelling   . PAF (paroxysmal atrial fibrillation) ([place])    a. Dx 04/2012, CHA2DS2VASc = 1 (age);  b. 04/2012 Echo: EF 40-50%, mild MR.  . Personal history of colonic adenomas 02/09/2013  . Shortness of breath dyspnea     Past Surgical History:  Procedure Laterality Date  . ANTERIOR CERVICAL DECOMP/DISCECTOMY FUSION N/A 07/21/2012   Procedure: ANTERIOR CERVICAL  DECOMPRESSION/DISCECTOMY FUSION 2 LEVELS;  Surgeon: [name], MD;  Location: [facility];  Service: Neurosurgery;  Laterality: N/A;  C4-5 C5-6 Anterior cervical decompression/diskectomy/fusion  . ANTERIOR LAT LUMBAR FUSION N/A 04/07/2017   Procedure: Thoracic twelve-Lumbar one Anterolateral decompression/fusion;  Surgeon: [name], MD;  Location: [facility];  Service: Neurosurgery;  Laterality: N/A;  . BACK SURGERY     x 3  . CARDIAC CATHETERIZATION  11/2008   [name] - 20% calcified non flow limiting left main, 50% EF apical hypokinesis  . CERVICAL DISC ARTHROPLASTY N/A 04/07/2017   Procedure: Cervical six-seven Disc arthroplasty;  Surgeon: [name], MD;  Location: [facility];  Service: Neurosurgery;  Laterality: N/A;  . CHEST TUBE INSERTION Left 04/11/2017   Procedure: CHEST TUBE INSERTION;  Surgeon: [name], MD;  Location: [facility];  Service: Thoracic;  Laterality: Left;  . CHOLECYSTECTOMY    . COLONOSCOPY    . ESOPHAGOGASTRODUODENOSCOPY    . EYE SURGERY Bilateral 2011   steroidal encapsulation" (04/28/2012)  . FOOT SURGERY Right    x 2  . KNEE ARTHROSCOPY Bilateral   . LATERAL / POSTERIOR COMBINED FUSION LUMBAR SPINE  2012  . REVERSE SHOULDER ARTHROPLASTY  04/28/2012   Procedure: REVERSE SHOULDER ARTHROPLASTY;  Surgeon: [name], MD;  Location: [facility];  Service: Orthopedics;  Laterality: Left;  Left shouder reverse total shoulder arthroplasty  . SHOULDER SURGERY    . [place]  . TOTAL HIP ARTHROPLASTY Left 2011   "left" (04/28/2012)  . TOTAL HIP ARTHROPLASTY Right 07/31/2015   Procedure: TOTAL HIP ARTHROPLASTY ANTERIOR APPROACH;  Surgeon: [name], MD;  Location: [facility];  Service: Orthopedics;  Laterality: Right;  . TOTAL KNEE ARTHROPLASTY  01/10/2012   Procedure: TOTAL KNEE ARTHROPLASTY;  Surgeon: [name], MD;  Location: [facility];  Service: Orthopedics;;  left total knee arthroplasty  . TOTAL KNEE ARTHROPLASTY Right 06/17/2014     Procedure: TOTAL KNEE ARTHROPLASTY;  Surgeon: [name], MD;  Location: [facility];  Service: Orthopedics;  Laterality: Right;     Medications: Current Meds  Medication Sig  . albuterol (VENTOLIN HFA) 108 (90 Base) MCG/ACT inhaler Inhale 2 puffs into the lungs every 4 (four) hours as needed for wheezing or shortness of breath.  . allopurinol (ZYLOPRIM) 100 MG tablet Take 100 mg by mouth daily.    [name] aspirin EC 81 MG tablet Take 1 tablet (81 mg total) by mouth daily.  [name] atorvastatin (LIPITOR) 20 MG tablet TAKE 1 TABLET BY MOUTH EVERY DAY  . candesartan (ATACAND) 4 MG tablet TAKE 1/2 TABLETS (2 MG TOTAL) BY MOUTH DAILY.  . cyclobenzaprine (FLEXERIL) 10 MG tablet Take 10  mg by mouth at bedtime as needed for muscle spasms.  . CYCLOBENZAPRINE HCL PO SMARTSIG:1 Tablet(s) By Mouth Every 8 Hours PRN  . diltiazem (CARDIZEM CD) 180 MG 24 hr capsule TAKE 1 CAPSULE BY MOUTH EVERY DAY  . furosemide (LASIX) 20 MG tablet Take 1 tablet (20 mg total) by mouth daily as needed. For shortness of breath and lower limb edema  . ipratropium-albuterol (DUONEB) 0.5-2.5 (3) MG/3ML SOLN 1 NEBULE EVERY 6 HOURS AS NEEDED. **J45.3**  . isosorbide mononitrate (IMDUR) 30 MG 24 hr tablet TAKE 1 TABLET (30 MG TOTAL) BY MOUTH DAILY.  . Nebulizers (COMPRESSOR/NEBULIZER) MISC Use as directed  . omeprazole (PRILOSEC) 40 MG capsule Take 1 capsule (40 mg total) by mouth 2 (two) times daily before a meal.  . predniSONE (DELTASONE) 10 MG tablet TAKE 1 TABLET (10 MG TOTAL) BY MOUTH DAILY. CONTINUOUS (Patient taking differently: Take 20 mg by mouth daily. continuous)  . STRIVERDI RESPIMAT 2.5 MCG/ACT AERS INHALE 2 PUFFS BY MOUTH INTO THE LUNGS DAILY  . tamsulosin (FLOMAX) 0.4 MG CAPS capsule Take 0.4 mg by mouth at bedtime.   . [DISCONTINUED] ciprofloxacin (CIPRO) 500 MG tablet Take 1 tablet (500 mg total) by mouth 2 (two) times daily.  . [DISCONTINUED] nystatin (MYCOSTATIN) 100000 UNIT/ML suspension Take 5 mLs by mouth 4 (four) times  daily.     Allergies: Allergies  Allergen Reactions  . Morphine And Related Palpitations and Other (See Comments)    Made patient light-headed and sweaty; "my heart races" (04/28/2012), pt states too much morphine gave him this reaction  . Levaquin [Levofloxacin] Itching  . Fluticasone-Salmeterol Other (See Comments)    Patient somewhat disputes this (??)    Social History: The patient  reports that he has never smoked. He has never used smokeless tobacco. He reports that he does not drink alcohol and does not use drugs.   Family History: The patient's family history includes Diabetes in his mother; Heart disease in his father; Prostate cancer in his brother.   Review of Systems: Please see the history of present illness.   All other systems are reviewed and negative.   Physical Exam: VS:  BP 110/64   Pulse 79   Ht 6\' 2"  (1.88 m)   Wt 157 lb 12.8 oz (71.6 kg)   SpO2 98%   BMI 20.26 kg/m  .  BMI Body mass index is 20.26 kg/m.  Wt Readings from Last 3 Encounters:  03/28/20 157 lb 12.8 oz (71.6 kg)  03/22/20 159 lb 9.6 oz (72.4 kg)  02/28/20 163 lb 5.8 oz (74.1 kg)    General: Pleasant. Alert and in no acute distress.   Cardiac: Regular rate and rhythm. Trace edema on the right. He has a support stocking in place.   Respiratory:  Lungs are relatively clear to auscultation bilaterally with normal work of breathing.  GI: Soft and nontender.  MS: No deformity or atrophy. Gait and ROM intact but he has a distinct limp.  Skin: Warm and dry. Color is normal.  Neuro:  Strength and sensation are intact and no gross focal deficits noted.  Psych: Alert, appropriate and with normal affect.   LABORATORY DATA:  EKG:  EKG is ordered today. Personally reviewed by me. This shows NSR. No acute changes. HR is 76.   Lab Results  Component Value Date   WBC 10.7 (H) 02/28/2020   HGB 12.4 (L) 02/28/2020   HCT 36.9 (L) 02/28/2020   PLT 183 02/28/2020   GLUCOSE 107 (H) 02/28/2020  CHOL 188 09/28/2019   TRIG 69 09/28/2019   HDL 109 09/28/2019   LDLCALC 66 09/28/2019   ALT 15 02/28/2020   AST 19 02/28/2020   NA 136 02/28/2020   K 4.1 02/28/2020   CL 100 02/28/2020   CREATININE 0.90 02/28/2020   BUN 20 02/28/2020   CO2 26 02/28/2020   TSH 5.330 (H) 10/21/2019   INR 0.98 07/25/2015   HGBA1C 5.5 10/21/2019     BNP (last 3 results) No results for input(s): BNP in the last 8760 hours.  ProBNP (last 3 results) No results for input(s): PROBNP in the last 8760 hours.   Other Studies Reviewed Today:  CXR 09/2018 IMPRESSION: 1. No acute cardiopulmonary disease. 2. Stable hyperexpansion. 3. Postsurgical changes  EchoStudy Conclusions6/2019  - Left ventricle: The cavity size was normal. Wall thickness was normal. Systolic function was mildly to moderately reduced. The estimated ejection fraction was in the range of 40% to 45%. Diffuse hypokinesis. Features are consistent with a pseudonormal left ventricular filling pattern, with concomitant abnormal relaxation and increased filling pressure (grade 2 diastolic dysfunction). - Right atrium: The atrium was mildly dilated.   ASSESSMENT & PLAN:   1. Atypical chest pain - EKG is ok - not exertional - sounds more musculoskeletal - will follow for now.   2. Progressive peripheral neuropathy - with falls - numerous orthopedic issues - this is his most pressing issue.   3. Infection of the right lower leg - has had prolonged course of antibiotics. Seems to be improving per his report.   4. COPD - recent pulmonary visit - he notes his breathing is stable.   5. PAF - remains in sinus - has not recurred since 2016 - he is just on aspirin - plan was for aspirin until 76 - but now with falls and progressive neuropathy - anticoagulation would be pretty challenging.   6. HLD - on statin.   7. Mild LV dysfunction - not on ideal therapy due to chronically soft BP and his COPD that limits use of  beta blocker. We have elected to continue with medical therapy. His symptoms are stable.   Current medicines are reviewed with the patient today.  The patient does not have concerns regarding medicines other than what has been noted above.  The following changes have been made:  See above.  Labs/ tests ordered today include:    Orders Placed This Encounter  Procedures  . EKG 12-Lead     Disposition:   FU with [name] in 6 months. Barnabas Lister is aware that I am leaving the practice in a few months.    Patient is agreeable to this plan and will call if any problems develop in the interim.   SignedTruitt Merle, NP  03/28/2020 10:19 AM  [facility] [address] [place], [place]  [address] Phone: [phone] Fax: [phone]

## 2020-03-16 DIAGNOSIS — M545 Low back pain, unspecified: Secondary | ICD-10-CM | POA: Diagnosis not present

## 2020-03-16 DIAGNOSIS — M5441 Lumbago with sciatica, right side: Secondary | ICD-10-CM | POA: Diagnosis not present

## 2020-03-17 DIAGNOSIS — M8949 Other hypertrophic osteoarthropathy, multiple sites: Secondary | ICD-10-CM | POA: Diagnosis not present

## 2020-03-17 DIAGNOSIS — E538 Deficiency of other specified B group vitamins: Secondary | ICD-10-CM | POA: Diagnosis not present

## 2020-03-17 DIAGNOSIS — E782 Mixed hyperlipidemia: Secondary | ICD-10-CM | POA: Diagnosis not present

## 2020-03-17 DIAGNOSIS — D801 Nonfamilial hypogammaglobulinemia: Secondary | ICD-10-CM | POA: Diagnosis not present

## 2020-03-17 DIAGNOSIS — J449 Chronic obstructive pulmonary disease, unspecified: Secondary | ICD-10-CM | POA: Diagnosis not present

## 2020-03-17 DIAGNOSIS — G629 Polyneuropathy, unspecified: Secondary | ICD-10-CM | POA: Diagnosis not present

## 2020-03-17 DIAGNOSIS — M109 Gout, unspecified: Secondary | ICD-10-CM | POA: Diagnosis not present

## 2020-03-17 DIAGNOSIS — I2510 Atherosclerotic heart disease of native coronary artery without angina pectoris: Secondary | ICD-10-CM | POA: Diagnosis not present

## 2020-03-17 DIAGNOSIS — L03115 Cellulitis of right lower limb: Secondary | ICD-10-CM | POA: Diagnosis not present

## 2020-03-17 DIAGNOSIS — I480 Paroxysmal atrial fibrillation: Secondary | ICD-10-CM | POA: Diagnosis not present

## 2020-03-17 DIAGNOSIS — D126 Benign neoplasm of colon, unspecified: Secondary | ICD-10-CM | POA: Diagnosis not present

## 2020-03-17 DIAGNOSIS — K219 Gastro-esophageal reflux disease without esophagitis: Secondary | ICD-10-CM | POA: Diagnosis not present

## 2020-03-20 DIAGNOSIS — M2011 Hallux valgus (acquired), right foot: Secondary | ICD-10-CM | POA: Diagnosis not present

## 2020-03-20 DIAGNOSIS — M2012 Hallux valgus (acquired), left foot: Secondary | ICD-10-CM | POA: Diagnosis not present

## 2020-03-20 DIAGNOSIS — M1909 Primary osteoarthritis, other specified site: Secondary | ICD-10-CM | POA: Diagnosis not present

## 2020-03-21 DIAGNOSIS — J449 Chronic obstructive pulmonary disease, unspecified: Secondary | ICD-10-CM | POA: Insufficient documentation

## 2020-03-21 DIAGNOSIS — G5702 Lesion of sciatic nerve, left lower limb: Secondary | ICD-10-CM | POA: Diagnosis not present

## 2020-03-21 DIAGNOSIS — M5442 Lumbago with sciatica, left side: Secondary | ICD-10-CM | POA: Diagnosis not present

## 2020-03-21 NOTE — Progress Notes (Signed)
Subjective:    Patient ID: Levi [name], male    DOB: 05/11/1946, 74 y.o.   MRN: [id number] HPI  Male never smoker followed for allergy/asthma, rhinitis, history  ABPA, complicated by PAfib, GERD PFT- 01/04/2015-minimal restriction, minimal diffusion defect, insignificant response to bronchodilator. FVC 4.55/86%, FEV1 3.28/83%, FEV1/FVC 0.72, TLC 75%, DLCO 77 Office Spirometry 10/25/16-mild restriction of exhaled volume, minimal obstruction. FVC 4.04/78%, FEV1 2.71/71%, ratio 0.67, FEF 25-75% 1.68/58% Respiratory allergy profile 10/26/15- total IgE 60, minor elevations for dust mites and Aspergillus fumigatus ---------------------------------------------------------------------------------------------------.  09/21/19- 74 year old male never smoker followed for allergy/asthma, rhinitis, history ABPA, complicated by PAfib, GERD Striverdi 2 puffs daily, pred maint 10 mg daily, Neb Duoneb, Qvar 80 2 p bid, albuterol hfa -----f/u moderate persistent asthma with acute exacerbation.  Fell in Feb> hip fx., THR. Sharp pains focal L lat scap border x 6 weeks intermittent. 2 Phizer Cpovax/  Feels he can't take comfortable deep breath. No wheeze or cough. Feet swell. Pending return to Cardiology  03/22/20- 74 year old male never smoker followed for allergy/asthma, rhinitis, history ABPA, complicated by PAfib, GERD, Hypogammaglobulinemia,  Striverdi 2 puffs daily, pred maint 10 mg daily, Neb Duoneb, Qvar 80 2 p bid, albuterol hfa ED 10/4 for swelling R lower leg. Rx'd lovenox, doxy. [place] neg for DVT Covid vax- 3 Phizer Flu vax- had Breathing is stable with occ mild wheeze noted. Meds are sufficient. Asks refill neb solution.  Took augmentin then cipro for serratia cellulitis leg. Back down to 10 mg daily maintenance prednisone.  CXR 09/21/19- The heart size and mediastinal contours are within normal limits. No acute abnormality of the lungs. Partially imaged cervical and lumbar fusion hardware. Left  shoulder reverse total arthroplasty. IMPRESSION: No acute abnormality of the lungs.  ROS-see HPI   + = positive Constitutional:    weight loss, night sweats, fevers, chills, fatigue, lassitude. HEENT:    headaches, difficulty swallowing, tooth/dental problems, sore throat,       sneezing, itching, ear ache, nasal congestion, post nasal drip, snoring CV:    chest pain, orthopnea, PND, +swelling in lower extremities, anasarca,                                           dizziness, palpitations Resp:   + shortness of breath with exertion or at rest.                productive cough,   non-productive cough, coughing up of blood.              change in color of mucus.   wheezing.   Skin:    rash or lesions. GI:  No-   heartburn, indigestion, abdominal pain, nausea, vomiting, diarrhea,                 change in bowel habits, loss of appetite GU: dysuria, change in color of urine, no urgency or frequency.   flank pain. MS:   joint pain, stiffness, decreased range of motion, back pain.. L scapula not tender. Neuro-     nothing unusual Psych:  change in mood or affect.  depression or anxiety.   memory loss.    Objective:  OBJ- Physical Exam General- Alert, Oriented, Affect-appropriate, Distress- none acute, tall/thin Skin- rash-none, lesions- none, excoriation- none Lymphadenopathy- none Head- atraumatic            Eyes- Gross vision intact,  PERRLA, conjunctivae and secretions clear            Ears- Hearing, canals-normal            Nose- Clear, no-Septal dev, mucus, polyps, erosion, perforation             Throat- Mallampati II , mucosa clear , drainage- none, tonsils- atrophic Neck- flexible , trachea midline, no stridor , thyroid nl, carotid no bruit Chest - symmetrical excursion , unlabored           Heart/CV- RRR , no murmur , no gallop  , no rub, nl s1 s2                           - JVD- none , edema- none, stasis changes- none, varices+ Left calf           Lung- +distant/ clear wheeze-  none, cough- none , dullness-none, rub- none           Chest wall-  Abd-  Br/ Gen/ Rectal- Not done, not indicated Extrem- cyanosis- none, clubbing, none, atrophy- none, strength- nl Neuro- + head bob and hands tremor    Assessment & Plan:

## 2020-03-22 ENCOUNTER — Ambulatory Visit (INDEPENDENT_AMBULATORY_CARE_PROVIDER_SITE_OTHER): Payer: Medicare Other | Admitting: Internal Medicine

## 2020-03-22 ENCOUNTER — Encounter: Payer: Self-pay | Admitting: Internal Medicine

## 2020-03-22 ENCOUNTER — Other Ambulatory Visit: Payer: Self-pay

## 2020-03-22 DIAGNOSIS — L0390 Cellulitis, unspecified: Secondary | ICD-10-CM | POA: Insufficient documentation

## 2020-03-22 DIAGNOSIS — J4540 Moderate persistent asthma, uncomplicated: Secondary | ICD-10-CM | POA: Diagnosis not present

## 2020-03-22 DIAGNOSIS — L03119 Cellulitis of unspecified part of limb: Secondary | ICD-10-CM

## 2020-03-22 HISTORY — DX: Cellulitis, unspecified: L03.90

## 2020-03-22 MED ORDER — IPRATROPIUM-ALBUTEROL 0.5-2.5 (3) MG/3ML IN SOLN: RESPIRATORY_TRACT | 4 refills | Status: AC

## 2020-03-22 NOTE — Assessment & Plan Note (Signed)
Mild fixed asthma/ COPD overlap with minimal obstruction at baseline. Occasional wheeze with no significant episodes. Plan- refill neb solution, continue current meds

## 2020-03-22 NOTE — Assessment & Plan Note (Signed)
Lingering problem being addressed. Improved with Cipro.

## 2020-03-22 NOTE — Patient Instructions (Signed)
Refill sent for nebulizer solution  I sure hope your leg gets well soon.  Please call if we can help

## 2020-03-28 ENCOUNTER — Ambulatory Visit (INDEPENDENT_AMBULATORY_CARE_PROVIDER_SITE_OTHER): Payer: Medicare Other | Admitting: Nurse Practitioner

## 2020-03-28 ENCOUNTER — Other Ambulatory Visit: Payer: Self-pay | Admitting: Cardiology

## 2020-03-28 ENCOUNTER — Encounter: Payer: Self-pay | Admitting: Nurse Practitioner

## 2020-03-28 ENCOUNTER — Other Ambulatory Visit: Payer: Self-pay

## 2020-03-28 VITALS — BP 110/64 | HR 79 | Ht 74.0 in | Wt 157.8 lb

## 2020-03-28 DIAGNOSIS — R609 Edema, unspecified: Secondary | ICD-10-CM

## 2020-03-28 DIAGNOSIS — I5022 Chronic systolic (congestive) heart failure: Secondary | ICD-10-CM

## 2020-03-28 DIAGNOSIS — J449 Chronic obstructive pulmonary disease, unspecified: Secondary | ICD-10-CM

## 2020-03-28 DIAGNOSIS — I480 Paroxysmal atrial fibrillation: Secondary | ICD-10-CM

## 2020-03-28 DIAGNOSIS — R0789 Other chest pain: Secondary | ICD-10-CM

## 2020-03-28 NOTE — Patient Instructions (Addendum)
After Visit Summary:  We will be checking the following labs today - NONE   Medication Instructions:    Continue with your current medicines.    If you need a refill on your cardiac medications before your next appointment, please call your pharmacy.     Testing/Procedures To Be Arranged:  N/A  Follow-Up:   See [name] in 6 months -  You will receive a reminder letter in the mail two months in advance. If you don't receive a letter, please call our office to schedule the follow-up appointment.    At [facility], you and your health needs are our priority.  As part of our continuing mission to provide you with exceptional heart care, we have created designated Provider Care Teams.  These Care Teams include your primary Cardiologist (physician) and Advanced Practice Providers (APPs -  Physician Assistants and Nurse Practitioners) who all work together to provide you with the care you need, when you need it.  Special Instructions:  . Stay safe, wash your hands for at least 20 seconds and wear a mask when needed.  . It was good to talk with you today.    Call the [place] office at [phone] if you have any questions, problems or concerns.

## 2020-03-29 ENCOUNTER — Other Ambulatory Visit: Payer: Self-pay

## 2020-03-29 ENCOUNTER — Ambulatory Visit (AMBULATORY_SURGERY_CENTER): Payer: Self-pay

## 2020-03-29 VITALS — Ht 74.0 in | Wt 158.0 lb

## 2020-03-29 DIAGNOSIS — Z86010 Personal history of colonic polyps: Secondary | ICD-10-CM

## 2020-03-29 NOTE — Progress Notes (Signed)
No egg or soy allergy known to patient  No issues with past sedation with any surgeries or procedures No intubation problems in the past  No FH of Malignant Hyperthermia No diet pills per patient No home 02 use per patient  No blood thinners per patient  Pt denies issues with constipation  Patient has AFIB EMMI video to pt or via [place] 19 guidelines implemented in PV today with Pt and RN  COVID vaccines completed on 06/2019 per pt;  Due to the COVID-19 pandemic we are asking patients to follow these guidelines. Please only bring one care partner. Please be aware that your care partner may wait in the car in the parking lot or if they feel like they will be too hot to wait in the car, they may wait in the lobby on the 4th floor. All care partners are required to wear a mask the entire time (we do not have any that we can provide them), they need to practice social distancing, and we will do a Covid check for all patient's and care partners when you arrive. Also we will check their temperature and your temperature. If the care partner waits in their car they need to stay in the parking lot the entire time and we will call them on their cell phone when the patient is ready for discharge so they can bring the car to the front of the building. Also all patient's will need to wear a mask into building.

## 2020-04-10 DIAGNOSIS — G5702 Lesion of sciatic nerve, left lower limb: Secondary | ICD-10-CM | POA: Diagnosis not present

## 2020-04-12 ENCOUNTER — Encounter: Payer: Self-pay | Admitting: Gastroenterology

## 2020-04-12 ENCOUNTER — Other Ambulatory Visit: Payer: Self-pay

## 2020-04-12 ENCOUNTER — Ambulatory Visit (AMBULATORY_SURGERY_CENTER): Payer: Medicare Other | Admitting: Gastroenterology

## 2020-04-12 VITALS — BP 117/63 | HR 64 | Temp 97.1°F | Resp 12 | Ht 74.0 in | Wt 158.0 lb

## 2020-04-12 DIAGNOSIS — Z86010 Personal history of colonic polyps: Secondary | ICD-10-CM

## 2020-04-12 DIAGNOSIS — D122 Benign neoplasm of ascending colon: Secondary | ICD-10-CM | POA: Diagnosis not present

## 2020-04-12 DIAGNOSIS — D124 Benign neoplasm of descending colon: Secondary | ICD-10-CM | POA: Diagnosis not present

## 2020-04-12 DIAGNOSIS — K5730 Diverticulosis of large intestine without perforation or abscess without bleeding: Secondary | ICD-10-CM | POA: Diagnosis not present

## 2020-04-12 DIAGNOSIS — D123 Benign neoplasm of transverse colon: Secondary | ICD-10-CM | POA: Diagnosis not present

## 2020-04-12 DIAGNOSIS — K640 First degree hemorrhoids: Secondary | ICD-10-CM

## 2020-04-12 MED ORDER — SODIUM CHLORIDE 0.9 % IV SOLN: 500.0000 mL | Freq: Once | INTRAVENOUS | Status: DC

## 2020-04-12 NOTE — Progress Notes (Signed)
pt tolerated well. VSS. awake and to recovery. Report given to RN.  

## 2020-04-12 NOTE — Progress Notes (Signed)
Pt's states no medical or surgical changes since previsit or office visit.  VS CW  

## 2020-04-12 NOTE — Progress Notes (Signed)
Lidocaine 2% 34ml IV given as per Dr. Bryan Lemma.

## 2020-04-12 NOTE — Patient Instructions (Addendum)
HANDOUTS PROVIDED ON: Polyps  The polyps removed today have been sent for pathology.  The results can take 1-3 weeks to receive.  Repeat colonoscopy in 1 year.  You may resume your previous diet and medication schedule.  Thank you for allowing [place] to care for you today!!!   YOU HAD AN ENDOSCOPIC PROCEDURE TODAY AT [facility]:   Refer to the procedure report that was given to you for any specific questions about what was found during the examination.  If the procedure report does not answer your questions, please call your gastroenterologist to clarify.  If you requested that your care partner not be given the details of your procedure findings, then the procedure report has been included in a sealed envelope for you to review at your convenience later.  YOU SHOULD EXPECT: Some feelings of bloating in the abdomen. Passage of more gas than usual.  Walking can help get rid of the air that was put into your GI tract during the procedure and reduce the bloating. If you had a lower endoscopy (such as a colonoscopy or flexible sigmoidoscopy) you may notice spotting of blood in your stool or on the toilet paper. If you underwent a bowel prep for your procedure, you may not have a normal bowel movement for a few days.  Please Note:  You might notice some irritation and congestion in your nose or some drainage.  This is from the oxygen used during your procedure.  There is no need for concern and it should clear up in a day or so.  SYMPTOMS TO REPORT IMMEDIATELY:   Following lower endoscopy (colonoscopy or flexible sigmoidoscopy):  Excessive amounts of blood in the stool  Significant tenderness or worsening of abdominal pains  Swelling of the abdomen that is new, acute  Fever of 100F or higher  For urgent or emergent issues, a gastroenterologist can be reached at any hour by calling [phone]. Do not use MyChart messaging for urgent concerns.    DIET:  We do recommend a small  meal at first, but then you may proceed to your regular diet.  Drink plenty of fluids but you should avoid alcoholic beverages for 24 hours.  ACTIVITY:  You should plan to take it easy for the rest of today and you should NOT DRIVE or use heavy machinery until tomorrow (because of the sedation medicines used during the test).    FOLLOW UP: Our staff will call the number listed on your records 48-72 hours following your procedure to check on you and address any questions or concerns that you may have regarding the information given to you following your procedure. If we do not reach you, we will leave a message.  We will attempt to reach you two times.  During this call, we will ask if you have developed any symptoms of COVID 19. If you develop any symptoms (ie: fever, flu-like symptoms, shortness of breath, cough etc.) before then, please call [phone].  If you test positive for Covid 19 in the 2 weeks post procedure, please call and report this information to [place].    If any biopsies were taken you will be contacted by phone or by letter within the next 1-3 weeks.  Please call us at [phone] if you have not heard about the biopsies in 3 weeks.    SIGNATURES/CONFIDENTIALITY: You and/or your care partner have signed paperwork which will be entered into your electronic medical record.  These signatures attest to the  fact that that the information above on your After Visit Summary has been reviewed and is understood.  Full responsibility of the confidentiality of this discharge information lies with you and/or your care-partner.

## 2020-04-12 NOTE — Op Note (Signed)
[facility] Patient Name: Levi [name] Procedure Date: 04/12/2020 7:21 AM MRN: [id number] Endoscopist: Gerrit Heck , MD Age: 74 Referring MD:  Date of Birth: 1945-07-22 Gender: Male Account #: [id number] Procedure:                Colonoscopy Indications:              Surveillance: Personal history of adenomatous                            polyps on last colonoscopy 5 years ago. Otherwise,                            no active GI symptoms.                           -Colonoscopy in 01/2015 with 1 small tubular                            adenoma, with recommendation to repeat in 5 years                           -Colonoscopy in 01/2013 with 3 small tubular                            adenomas. Medicines:                Monitored Anesthesia Care Procedure:                Pre-Anesthesia Assessment:                           - Prior to the procedure, a History and Physical                            was performed, and patient medications and                            allergies were reviewed. The patient's tolerance of                            previous anesthesia was also reviewed. The risks                            and benefits of the procedure and the sedation                            options and risks were discussed with the patient.                            All questions were answered, and informed consent                            was obtained. Prior Anticoagulants: The patient has  taken no previous anticoagulant or antiplatelet                            agents except for aspirin. ASA Grade Assessment:                            III - A patient with severe systemic disease. After                            reviewing the risks and benefits, the patient was                            deemed in satisfactory condition to undergo the                            procedure.                           After obtaining informed consent, the  colonoscope                            was passed under direct vision. Throughout the                            procedure, the patient's blood pressure, pulse, and                            oxygen saturations were monitored continuously. The                            Colonoscope was introduced through the anus and                            advanced to the the cecum, identified by                            appendiceal orifice and ileocecal valve. The                            colonoscopy was technically difficult and complex                            due to inadequate bowel prep. The patient tolerated                            the procedure well. The quality of the bowel                            preparation was fair. The ileocecal valve,                            appendiceal orifice, and rectum were photographed. Scope In: 8:07:58 AM Scope Out: 8:39:49 AM Scope Withdrawal Time: 0 hours 19 minutes 9 seconds  Total Procedure Duration: 0 hours  31 minutes 51 seconds  Findings:                 The perianal and digital rectal examinations were                            normal.                           Two sessile polyps were found in the ascending                            colon. The polyps were 3 to 6 mm in size. These                            polyps were removed with a cold snare. Resection                            and retrieval were complete. Estimated blood loss                            was minimal.                           Four sessile polyps were found in the descending                            colon (1) and transverse colon (3). The polyps were                            4 to 8 mm in size. These polyps were removed with a                            cold snare. Resection and retrieval were complete.                            Estimated blood loss was minimal.                           Multiple small and large-mouthed diverticula were                            found  in the sigmoid colon, descending colon,                            transverse colon and ascending colon.                           Non-bleeding internal hemorrhoids were found during                            retroflexion. The hemorrhoids were small and Grade                            I (internal hemorrhoids that do not  prolapse).                           The sigmoid colon was moderately tortuous.                           A moderate amount of semi-solid solid stool was                            found in the entire colon, interfering with                            visualization. Lavage of the area was performed                            using copious amounts of tap water, resulting in                            clearance with fair visualization. Solid debris                            clogged the colonoscope several times, impeding                            clearance and complete visualization. Cannot rule                            out the presence of small or flat polyps in these                            areas. Complications:            No immediate complications. Estimated Blood Loss:     Estimated blood loss: none. Estimated blood loss                            was minimal. Impression:               - Preparation of the colon was fair.                           - Two 3 to 6 mm polyps in the ascending colon,                            removed with a cold snare. Resected and retrieved.                           - Four 4 to 8 mm polyps in the descending colon and                            in the transverse colon, removed with a cold snare.                            Resected and retrieved.                           -  Diverticulosis in the sigmoid colon, in the                            descending colon, in the transverse colon and in                            the ascending colon.                           - Non-bleeding internal hemorrhoids.                           -  Tortuous colon.                           - Stool in the entire examined colon. Recommendation:           - Patient has a contact number available for                            emergencies. The signs and symptoms of potential                            delayed complications were discussed with the                            patient. Return to normal activities tomorrow.                            Written discharge instructions were provided to the                            patient.                           - Resume previous diet.                           - Continue present medications.                           - Await pathology results.                           - Repeat colonoscopy in 1 year because the bowel                            preparation was suboptimal.                           - Return to GI office PRN. Gerrit Heck, MD 04/12/2020 8:48:24 AM

## 2020-04-13 DIAGNOSIS — M5441 Lumbago with sciatica, right side: Secondary | ICD-10-CM | POA: Diagnosis not present

## 2020-04-13 DIAGNOSIS — M5442 Lumbago with sciatica, left side: Secondary | ICD-10-CM | POA: Diagnosis not present

## 2020-04-13 DIAGNOSIS — M545 Low back pain, unspecified: Secondary | ICD-10-CM | POA: Diagnosis not present

## 2020-04-14 ENCOUNTER — Telehealth: Payer: Self-pay

## 2020-04-14 DIAGNOSIS — G629 Polyneuropathy, unspecified: Secondary | ICD-10-CM | POA: Diagnosis not present

## 2020-04-14 NOTE — Telephone Encounter (Signed)
  Follow up Call-  Call back number 04/12/2020 03/02/2019  Post procedure Call Back phone  # [phone]  Permission to leave phone message Yes Yes  Some recent data might be hidden     Patient questions:  Do you have a fever, pain , or abdominal swelling? No. Pain Score  0 *  Have you tolerated food without any problems? Yes.    Have you been able to return to your normal activities? Yes.    Do you have any questions about your discharge instructions: Diet   No. Medications  No. Follow up visit  No.  Do you have questions or concerns about your Care? No.  Actions: * If pain score is 4 or above: No action needed, pain <4.  1. Have you developed a fever since your procedure? No  2.   Have you had an respiratory symptoms (SOB or cough) since your procedure? No  3.   Have you tested positive for COVID 19 since your procedure No 4.   Have you had any family members/close contacts diagnosed with the COVID 19 since your procedure? No   If yes to any of these questions please route to Joylene Eldo, RN and Joella Prince, RN

## 2020-04-24 ENCOUNTER — Other Ambulatory Visit: Payer: Self-pay | Admitting: Internal Medicine

## 2020-04-26 DIAGNOSIS — N401 Enlarged prostate with lower urinary tract symptoms: Secondary | ICD-10-CM | POA: Diagnosis not present

## 2020-04-27 ENCOUNTER — Encounter: Payer: Self-pay | Admitting: Gastroenterology

## 2020-05-03 DIAGNOSIS — R350 Frequency of micturition: Secondary | ICD-10-CM | POA: Diagnosis not present

## 2020-05-03 DIAGNOSIS — N401 Enlarged prostate with lower urinary tract symptoms: Secondary | ICD-10-CM | POA: Diagnosis not present

## 2020-05-03 DIAGNOSIS — Z125 Encounter for screening for malignant neoplasm of prostate: Secondary | ICD-10-CM | POA: Diagnosis not present

## 2020-05-06 ENCOUNTER — Other Ambulatory Visit: Payer: Self-pay | Admitting: Cardiology

## 2020-05-10 DIAGNOSIS — G5702 Lesion of sciatic nerve, left lower limb: Secondary | ICD-10-CM | POA: Diagnosis not present

## 2020-05-11 DIAGNOSIS — M25552 Pain in left hip: Secondary | ICD-10-CM | POA: Diagnosis not present

## 2020-05-11 DIAGNOSIS — M7062 Trochanteric bursitis, left hip: Secondary | ICD-10-CM | POA: Diagnosis not present

## 2020-05-31 ENCOUNTER — Telehealth: Payer: Self-pay | Admitting: Internal Medicine

## 2020-05-31 MED ORDER — AMOXICILLIN-POT CLAVULANATE 875-125 MG PO TABS: 1.0000 | ORAL_TABLET | Freq: Two times a day (BID) | ORAL | 0 refills | Status: DC

## 2020-05-31 NOTE — Telephone Encounter (Signed)
Please send aumentin 875 mg, # 14, 1 twice daily

## 2020-05-31 NOTE — Telephone Encounter (Signed)
Called and spoke with pt who has complaints of cough with brown phlegm which he stated began 3 days ago. Pt also has complaints of wheezing. Has been using his albuterol inhaler about 3 times daily and has also done at least one neb tx.  Pt denies any complaints of feer.  Pt has been using his Striverdi Respimat inhaler daily and also is taking his 20mg  prednisone daily.  Pt states he usually gets these same symptoms every year around this time and usually has augmentin called in to pharmacy by CY.  Dr. , please advise.  Allergies  Allergen Reactions  . Morphine And Related Palpitations and Other (See Comments)    Made patient light-headed and sweaty; "my heart races" (04/28/2012), pt states too much morphine gave him this reaction  . Levaquin [Levofloxacin] Itching  . Fluticasone-Salmeterol Other (See Comments)    Patient somewhat disputes this (??)     Current Outpatient Medications:  .  albuterol (VENTOLIN HFA) 108 (90 Base) MCG/ACT inhaler, Inhale 2 puffs into the lungs every 4 (four) hours as needed for wheezing or shortness of breath., Disp: 18 g, Rfl: 12 .  allopurinol (ZYLOPRIM) 100 MG tablet, Take 100 mg by mouth daily.  , Disp: , Rfl:  .  aspirin EC 81 MG tablet, Take 1 tablet (81 mg total) by mouth daily., Disp: 90 tablet, Rfl: 3 .  atorvastatin (LIPITOR) 20 MG tablet, TAKE 1 TABLET BY MOUTH EVERY DAY, Disp: 90 tablet, Rfl: 3 .  candesartan (ATACAND) 4 MG tablet, TAKE 1/2 TABLETS (2 MG TOTAL) BY MOUTH DAILY., Disp: 45 tablet, Rfl: 3 .  cyclobenzaprine (FLEXERIL) 10 MG tablet, Take 10 mg by mouth at bedtime as needed for muscle spasms., Disp: , Rfl:  .  diltiazem (CARDIZEM CD) 180 MG 24 hr capsule, TAKE 1 CAPSULE BY MOUTH EVERY DAY, Disp: 90 capsule, Rfl: 3 .  furosemide (LASIX) 20 MG tablet, Take 1 tablet (20 mg total) by mouth daily as needed. For shortness of breath and lower limb edema, Disp: 90 tablet, Rfl: 3 .  ipratropium-albuterol (DUONEB) 0.5-2.5 (3) MG/3ML SOLN, 1  NEBULE EVERY 6 HOURS AS NEEDED. **J45.3**, Disp: 270 mL, Rfl: 4 .  isosorbide mononitrate (IMDUR) 30 MG 24 hr tablet, TAKE 1 TABLET (30 MG TOTAL) BY MOUTH DAILY., Disp: 30 tablet, Rfl: 11 .  Nebulizers (COMPRESSOR/NEBULIZER) MISC, Use as directed, Disp: 1 each, Rfl: 0 .  omeprazole (PRILOSEC) 40 MG capsule, Take 1 capsule (40 mg total) by mouth 2 (two) times daily before a meal., Disp: 60 capsule, Rfl: 11 .  predniSONE (DELTASONE) 10 MG tablet, Take 2 tablets (20 mg total) by mouth daily. continuous, Disp: 30 tablet, Rfl: 5 .  STRIVERDI RESPIMAT 2.5 MCG/ACT AERS, INHALE 2 PUFFS BY MOUTH INTO THE LUNGS DAILY, Disp: 4 g, Rfl: 12 .  tamsulosin (FLOMAX) 0.4 MG CAPS capsule, Take 0.4 mg by mouth at bedtime. , Disp: , Rfl: 11

## 2020-05-31 NOTE — Telephone Encounter (Signed)
rx sent to pharmacy.  Pt aware of recs.  Nothing further needed at this time- will close encounter.

## 2020-06-20 ENCOUNTER — Encounter: Payer: Self-pay | Admitting: Gastroenterology

## 2020-06-20 ENCOUNTER — Ambulatory Visit (INDEPENDENT_AMBULATORY_CARE_PROVIDER_SITE_OTHER): Payer: Medicare Other | Admitting: Gastroenterology

## 2020-06-20 VITALS — BP 98/68 | HR 97 | Ht 74.0 in | Wt 165.2 lb

## 2020-06-20 DIAGNOSIS — R1310 Dysphagia, unspecified: Secondary | ICD-10-CM

## 2020-06-20 DIAGNOSIS — K222 Esophageal obstruction: Secondary | ICD-10-CM

## 2020-06-20 DIAGNOSIS — K219 Gastro-esophageal reflux disease without esophagitis: Secondary | ICD-10-CM | POA: Diagnosis not present

## 2020-06-20 DIAGNOSIS — Z96641 Presence of right artificial hip joint: Secondary | ICD-10-CM | POA: Diagnosis not present

## 2020-06-20 DIAGNOSIS — Z8709 Personal history of other diseases of the respiratory system: Secondary | ICD-10-CM

## 2020-06-20 DIAGNOSIS — Z860100 Personal history of colon polyps, unspecified: Secondary | ICD-10-CM

## 2020-06-20 DIAGNOSIS — R12 Heartburn: Secondary | ICD-10-CM | POA: Diagnosis not present

## 2020-06-20 DIAGNOSIS — Z471 Aftercare following joint replacement surgery: Secondary | ICD-10-CM | POA: Diagnosis not present

## 2020-06-20 DIAGNOSIS — M25551 Pain in right hip: Secondary | ICD-10-CM | POA: Diagnosis not present

## 2020-06-20 NOTE — Patient Instructions (Signed)
If you are age 74 or older, your body mass index should be between 23-30. Your Body mass index is 21.22 kg/m. If this is out of the aforementioned range listed, please consider follow up with your Primary Care Provider.  If you are age 34 or younger, your body mass index should be between 19-25. Your Body mass index is 21.22 kg/m. If this is out of the aformentioned range listed, please consider follow up with your Primary Care Provider.   You have been scheduled for an endoscopy. Please follow written instructions given to you at your visit today. If you use inhalers (even only as needed), please bring them with you on the day of your procedure.  Thank you,  Dr. Jackquline Denmark

## 2020-06-20 NOTE — Progress Notes (Signed)
Chief Complaint: Dysphagia, Odynophagia, GERD   HPI:     Levi [name] is a 75 y.o. male with history of atrial fibrillation (aspirin), COPD, asthma (steroid-dependent, inhalers), osteoarthritis, presenting to the [facility] for routine follow-up.  Last seen in GI clinic by Amy Esterwood in 04/2019 following EGD by Dr. Carlean Purl in 02/2019 which was notable for suspected esophageal candidiasis (biopsies were negative).  Otherwise continued treatment of GERD with omeprazole 40 mg bid, which seemed controlled at that time.  Since then, colonoscopy completed in 05/2019 notable for tubular adenoma x6 and fair prep, with recommendation repeat in 1 year.  Today, he states he has had recurrence of dysphagia and odynophagia.  Describes a "bowling ball "sitting in the middle of his throat.  Can be present all day long.  Feels similar to when he had his EGD 02/2019.  Also with recurrence of reflux symptoms, described as heartburn, regurgitation, waterbrash.  Symptoms can occur throughout the day, and rarely nocturnal symptoms.  Has continued to take omeprazole as prescribed.   Endoscopic Hx: -Colonoscopy in 01/2013 with 3 small tubular adenomas. -Colonoscopy in 01/2015 with 1 small tubular adenoma, with recommendation to repeat in 5 years -EGD (02/2019): Esophageal candidiasis (bxs negative though), benign distal esophageal stenosis (nondilated).  Treated with fluconazole -Colonoscopy (03/2020, Dr. Bryan Lemma): 6 tubular adenomas, pandiverticulosis, internal hemorrhoids.  Fair prep.  Repeat in 1 year   Past Medical History:  Diagnosis Date  . Agent orange exposure 1970's   takes Imdur,Diltiazem, and Atacand daily  . ALLERGIC RHINITIS   . Allergy    seasonal allergies  . Asthma    uses inhaler and nebulizers  . Bruises easily    d/t prednisone daily  . CHF (congestive heart failure) ([facility])    dx-on meds to treat  . Chronic bronchitis ([facility])    "used to get it q yr;  last time ?2008" (04/28/2012)  . COPD (chronic obstructive pulmonary disease) ([facility])    agent orange exposure  . Degenerative disk disease    "everywhere" (04/28/2012)  . Diverticulitis   . Dysrhythmia    afib  . Elevated uric acid in blood    takes Allopurinol daily  . Enlarged prostate    but not on any meds  . GERD (gastroesophageal reflux disease)    takes Protonix daily  . H/O hiatal hernia   . Headache   . History of pneumonia    a. 2010  . Hyperlipidemia    takes Lipitor daily; pt. states he takes a preventive  . Joint pain   . Joint swelling   . PAF (paroxysmal atrial fibrillation) ([facility])    a. Dx 04/2012, CHA2DS2VASc = 1 (age);  b. 04/2012 Echo: EF 40-50%, mild MR.  . Personal history of colonic adenomas 02/09/2013  . Shortness of breath dyspnea   . Thyroid disease    no taking meds-caused dizziness     Past Surgical History:  Procedure Laterality Date  . ANTERIOR CERVICAL DECOMP/DISCECTOMY FUSION N/A 07/21/2012   Procedure: ANTERIOR CERVICAL DECOMPRESSION/DISCECTOMY FUSION 2 LEVELS;  Surgeon: [name], MD;  Location: [facility];  Service: Neurosurgery;  Laterality: N/A;  C4-5 C5-6 Anterior cervical decompression/diskectomy/fusion  . ANTERIOR LAT LUMBAR FUSION N/A 04/07/2017   Procedure: Thoracic twelve-Lumbar one Anterolateral decompression/fusion;  Surgeon: [name], MD;  Location: [facility];  Service: Neurosurgery;  Laterality: N/A;  . BACK SURGERY     x 3  . CARDIAC CATHETERIZATION  11/2008   [name] - 20% calcified non flow limiting left main, 50% EF apical hypokinesis  . CERVICAL DISC ARTHROPLASTY N/A 04/07/2017   Procedure: Cervical six-seven Disc arthroplasty;  Surgeon: [name], MD;  Location: [facility];  Service: Neurosurgery;  Laterality: N/A;  . CHEST TUBE INSERTION Left 04/11/2017   Procedure: CHEST TUBE INSERTION;  Surgeon: [name], MD;  Location: [facility];  Service: Thoracic;  Laterality: Left;  . CHOLECYSTECTOMY    . COLONOSCOPY  2016    CG-MAC-miralax(good)servere TICS/TA x 2  . ESOPHAGOGASTRODUODENOSCOPY    . EYE SURGERY Bilateral 2011   steroidal encapsulation" (04/28/2012)  . FOOT SURGERY Right    x 2  . KNEE ARTHROSCOPY Bilateral   . LATERAL / POSTERIOR COMBINED FUSION LUMBAR SPINE  2012  . POLYPECTOMY  2016   severe TICS/TA x 2  . REVERSE SHOULDER ARTHROPLASTY  04/28/2012   Procedure: REVERSE SHOULDER ARTHROPLASTY;  Surgeon: [name], MD;  Location: [facility];  Service: Orthopedics;  Laterality: Left;  Left shouder reverse total shoulder arthroplasty  . SHOULDER SURGERY    . [place]  . TOTAL HIP ARTHROPLASTY Left 2011   "left" (04/28/2012)  . TOTAL HIP ARTHROPLASTY Right 07/31/2015   Procedure: TOTAL HIP ARTHROPLASTY ANTERIOR APPROACH;  Surgeon: [name], MD;  Location: [facility];  Service: Orthopedics;  Laterality: Right;  . TOTAL KNEE ARTHROPLASTY  01/10/2012   Procedure: TOTAL KNEE ARTHROPLASTY;  Surgeon: [name], MD;  Location: [facility];  Service: Orthopedics;;  left total knee arthroplasty  . TOTAL KNEE ARTHROPLASTY Right 06/17/2014   Procedure: TOTAL KNEE ARTHROPLASTY;  Surgeon: [name], MD;  Location: [facility];  Service: Orthopedics;  Laterality: Right;   Family History  Problem Relation Age of Onset  . Heart disease Father        Died 74, MI  . Diabetes Mother   . Prostate cancer Brother   . Colon polyps Brother   . Colon polyps Brother   . Colon cancer Neg Hx   . Esophageal cancer Neg Hx   . Rectal cancer Neg Hx   . Stomach cancer Neg Hx   . Pancreatic cancer Neg Hx   . Kidney disease Neg Hx   . Liver disease Neg Hx    Social History   Tobacco Use  . Smoking status: Never Smoker  . Smokeless tobacco: Never Used  Vaping Use  . Vaping Use: Never used  Substance Use Topics  . Alcohol use: No    Alcohol/week: 0.0 standard drinks  . Drug use: No   Current Outpatient Medications  Medication Sig Dispense Refill  . albuterol (VENTOLIN HFA) 108 (90  Base) MCG/ACT inhaler Inhale 2 puffs into the lungs every 4 (four) hours as needed for wheezing or shortness of breath. 18 g 12  . allopurinol (ZYLOPRIM) 100 MG tablet Take 100 mg by mouth daily.      [name] amoxicillin-clavulanate (AUGMENTIN) 875-125 MG tablet Take 1 tablet by mouth 2 (two) times daily. 14 tablet 0  . aspirin EC 81 MG tablet Take 1 tablet (81 mg total) by mouth daily. 90 tablet 3  . atorvastatin (LIPITOR) 20 MG tablet TAKE 1 TABLET BY MOUTH EVERY DAY 90 tablet 3  . candesartan (ATACAND) 4 MG tablet TAKE 1/2 TABLETS (2 MG TOTAL) BY MOUTH DAILY. 45 tablet 3  . cyclobenzaprine (FLEXERIL) 10 MG tablet Take 10 mg by mouth at bedtime as needed for muscle spasms.    [name] diltiazem (  CARDIZEM CD) 180 MG 24 hr capsule TAKE 1 CAPSULE BY MOUTH EVERY DAY 90 capsule 3  . furosemide (LASIX) 20 MG tablet Take 1 tablet (20 mg total) by mouth daily as needed. For shortness of breath and lower limb edema 90 tablet 3  . ipratropium-albuterol (DUONEB) 0.5-2.5 (3) MG/3ML SOLN 1 NEBULE EVERY 6 HOURS AS NEEDED. **J45.3** 270 mL 4  . isosorbide mononitrate (IMDUR) 30 MG 24 hr tablet TAKE 1 TABLET (30 MG TOTAL) BY MOUTH DAILY. 30 tablet 11  . Nebulizers (COMPRESSOR/NEBULIZER) MISC Use as directed 1 each 0  . omeprazole (PRILOSEC) 40 MG capsule Take 1 capsule (40 mg total) by mouth 2 (two) times daily before a meal. 60 capsule 11  . predniSONE (DELTASONE) 10 MG tablet Take 2 tablets (20 mg total) by mouth daily. continuous 30 tablet 5  . STRIVERDI RESPIMAT 2.5 MCG/ACT AERS INHALE 2 PUFFS BY MOUTH INTO THE LUNGS DAILY 4 g 12  . tamsulosin (FLOMAX) 0.4 MG CAPS capsule Take 0.4 mg by mouth at bedtime.   11   No current facility-administered medications for this visit.   Allergies  Allergen Reactions  . Morphine And Related Palpitations and Other (See Comments)    Made patient light-headed and sweaty; "my heart races" (04/28/2012), pt states too much morphine gave him this reaction  . Levaquin [Levofloxacin]  Itching  . Fluticasone-Salmeterol Other (See Comments)    Patient somewhat disputes this (??)     Review of Systems: All systems reviewed and negative except where noted in HPI.     Physical Exam:    Wt Readings from Last 3 Encounters:  04/12/20 158 lb (71.7 kg)  03/29/20 158 lb (71.7 kg)  03/28/20 157 lb 12.8 oz (71.6 kg)    There were no vitals taken for this visit. Constitutional:  Pleasant, in no acute distress. Psychiatric: Normal mood and affect. Behavior is normal. EENT: Pupils normal.  Conjunctivae are normal. No scleral icterus. Neck supple. No cervical LAD. Cardiovascular: Normal rate, regular rhythm. No edema Pulmonary/chest: Effort normal and breath sounds normal. No wheezing, rales or rhonchi. Abdominal: Soft, nondistended, nontender. Bowel sounds active throughout. There are no masses palpable. No hepatomegaly. Neurological: Alert and oriented to person place and time. Skin: Skin is warm and dry. No rashes noted.   ASSESSMENT AND PLAN;   1) Dysphagia 2) Odynophagia 3) History of esophageal stricture 4) GERD  -Expedited EGD with esophageal dilation and biopsies -Reevaluate for Esophageal Candidiasis with EGD -Continue current PPI, but modified timing to take a.m. dose prior to coffee/breakfast.  Taking p.m. dose correctly.  Likely modify acid suppression plan pending endoscopic findings -If EGD otherwise unrevealing, can plan for Esophageal Manometry +/-pH/impedance (on PPI given significant breakthrough on high-dose) -Continue chewing food thoroughly, taking time with meals, drink plenty of fluids with meals  5) History of COPD 6) History of asthma  7) History of colon polyps -Plan for repeat colonoscopy in 2022 given fair prep and tubular adenomas x6 in 05/2019.  In light of acute UGI symptoms, can plan for bowel preparation for colonoscopy after adequate treatment of the above  The indications, risks, and benefits of EGD were explained to the patient  in detail. Risks include but are not limited to bleeding, perforation, adverse reaction to medications, and cardiopulmonary compromise. Sequelae include but are not limited to the possibility of surgery, hospitalization, and mortality. The patient verbalized understanding and wished to proceed. All questions answered, referred to scheduler. Further recommendations pending results of the exam.  Lavena Bullion, DO, FACG  06/20/2020, 8:36 AM   Orpah Melter, MD

## 2020-06-23 ENCOUNTER — Ambulatory Visit (AMBULATORY_SURGERY_CENTER): Payer: Medicare Other | Admitting: Gastroenterology

## 2020-06-23 ENCOUNTER — Other Ambulatory Visit: Payer: Self-pay

## 2020-06-23 ENCOUNTER — Encounter: Payer: Self-pay | Admitting: Gastroenterology

## 2020-06-23 VITALS — BP 113/71 | HR 55 | Temp 98.6°F | Resp 16 | Ht 74.0 in | Wt 165.0 lb

## 2020-06-23 DIAGNOSIS — B3781 Candidal esophagitis: Secondary | ICD-10-CM

## 2020-06-23 DIAGNOSIS — K219 Gastro-esophageal reflux disease without esophagitis: Secondary | ICD-10-CM

## 2020-06-23 DIAGNOSIS — R1310 Dysphagia, unspecified: Secondary | ICD-10-CM

## 2020-06-23 DIAGNOSIS — K2970 Gastritis, unspecified, without bleeding: Secondary | ICD-10-CM | POA: Diagnosis not present

## 2020-06-23 DIAGNOSIS — J449 Chronic obstructive pulmonary disease, unspecified: Secondary | ICD-10-CM | POA: Diagnosis not present

## 2020-06-23 DIAGNOSIS — K222 Esophageal obstruction: Secondary | ICD-10-CM | POA: Diagnosis not present

## 2020-06-23 DIAGNOSIS — K209 Esophagitis, unspecified without bleeding: Secondary | ICD-10-CM | POA: Diagnosis not present

## 2020-06-23 DIAGNOSIS — J45909 Unspecified asthma, uncomplicated: Secondary | ICD-10-CM | POA: Diagnosis not present

## 2020-06-23 DIAGNOSIS — I10 Essential (primary) hypertension: Secondary | ICD-10-CM | POA: Diagnosis not present

## 2020-06-23 MED ORDER — SODIUM CHLORIDE 0.9 % IV SOLN: 500.0000 mL | INTRAVENOUS | Status: DC

## 2020-06-23 MED ORDER — FLUCONAZOLE 100 MG PO TABS: ORAL_TABLET | ORAL | 0 refills | Status: DC

## 2020-06-23 NOTE — Patient Instructions (Signed)
HANDOUTS PROVIDED ON: POST DILATION DIET & GASTRITIS  The biopsies taken today have been sent for pathology.  The results can take 1-3 weeks to receive.    You may resume your previous medication schedule.  A prescription has been sent to your pharmacy for Diflucan (fluconazole).  You will take this medication 2 tablets on day 1 and then 1 tablet daily for 3 weeks.  Your diet today should consist of soft foods ONLY for today.  Tomorrow you may resume your regular diet.  Thank you for allowing [place] to care for you today!!!   YOU HAD AN ENDOSCOPIC PROCEDURE TODAY AT [place] [facility]:   Refer to the procedure report that was given to you for any specific questions about what was found during the examination.  If the procedure report does not answer your questions, please call your gastroenterologist to clarify.  If you requested that your care partner not be given the details of your procedure findings, then the procedure report has been included in a sealed envelope for you to review at your convenience later.  YOU SHOULD EXPECT: Some feelings of bloating in the abdomen. Passage of more gas than usual.  Walking can help get rid of the air that was put into your GI tract during the procedure and reduce the bloating.   Please Note:  You might notice some irritation and congestion in your nose or some drainage.  This is from the oxygen used during your procedure.  There is no need for concern and it should clear up in a day or so.  SYMPTOMS TO REPORT IMMEDIATELY:   Following upper endoscopy (EGD)  Vomiting of blood or coffee ground material  New chest pain or pain under the shoulder blades  Painful or persistently difficult swallowing  New shortness of breath  Fever of 100F or higher  Black, tarry-looking stools  For urgent or emergent issues, a gastroenterologist can be reached at any hour by calling [phone]. Do not use MyChart messaging for urgent concerns.    DIET:  We  do recommend a soft foods only today and tomorrow you may proceed to your regular diet.  Drink plenty of fluids but you should avoid alcoholic beverages for 24 hours.  ACTIVITY:  You should plan to take it easy for the rest of today and you should NOT DRIVE or use heavy machinery until tomorrow (because of the sedation medicines used during the test).    FOLLOW UP: Our staff will call the number listed on your records Tuesday morning between 7:15 am and 8:15 am to check on you and address any questions or concerns that you may have regarding the information given to you following your procedure. If we do not reach you, we will leave a message.  We will attempt to reach you two times.  During this call, we will ask if you have developed any symptoms of COVID 19. If you develop any symptoms (ie: fever, flu-like symptoms, shortness of breath, cough etc.) before then, please call [phone].  If you test positive for Covid 19 in the 2 weeks post procedure, please call and report this information to [place].    If any biopsies were taken you will be contacted by phone or by letter within the next 1-3 weeks.  Please call us at [phone] if you have not heard about the biopsies in 3 weeks.    SIGNATURES/CONFIDENTIALITY: You and/or your care partner have signed paperwork which will be entered into  your electronic medical record.  These signatures attest to the fact that that the information above on your After Visit Summary has been reviewed and is understood.  Full responsibility of the confidentiality of this discharge information lies with you and/or your care-partner.

## 2020-06-23 NOTE — Progress Notes (Signed)
Called to room to assist during endoscopic procedure.  Patient ID and intended procedure confirmed with present staff. Received instructions for my participation in the procedure from the performing physician.  

## 2020-06-23 NOTE — Progress Notes (Signed)
Report to PACU, RN, vss, BBS= Clear.  

## 2020-06-23 NOTE — Op Note (Signed)
[facility] Patient Name: Levi [name] Procedure Date: 06/23/2020 8:04 AM MRN: [id number] Endoscopist: Gerrit Heck , MD Age: 75 Referring MD:  Date of Birth: [birth year] Gender: Male Account #: [id number] Procedure:                Upper GI endoscopy Indications:              Dysphagia, Odynophagia, Esophageal reflux Medicines:                Monitored Anesthesia Care Procedure:                Pre-Anesthesia Assessment:                           - Prior to the procedure, a History and Physical                            was performed, and patient medications and                            allergies were reviewed. The patient's tolerance of                            previous anesthesia was also reviewed. The risks                            and benefits of the procedure and the sedation                            options and risks were discussed with the patient.                            All questions were answered, and informed consent                            was obtained. Prior Anticoagulants: The patient has                            taken no previous anticoagulant or antiplatelet                            agents. ASA Grade Assessment: III - A patient with                            severe systemic disease. After reviewing the risks                            and benefits, the patient was deemed in                            satisfactory condition to undergo the procedure.                           After obtaining informed consent, the endoscope was  passed under direct vision. Throughout the                            procedure, the patient's blood pressure, pulse, and                            oxygen saturations were monitored continuously. The                            Endoscope was introduced through the mouth, and                            advanced to the second part of duodenum. The upper                            GI endoscopy  was accomplished without difficulty.                            The patient tolerated the procedure well. Scope In: Scope Out: Findings:                 One benign-appearing, non-obstructing intrinsic                            mild stenosis was found 39 cm from the incisors.                            This stenosis measured less than one cm (in                            length). The stenosis was easily traversed. The                            scope was withdrawn. Dilation was performed with a                            Maloney dilator with mild resistance at 70 Fr. The                            dilation site was examined following endoscope                            reinsertion and showed mild mucosal disruption at                            18 cm, consistent with successful dilation of a                            proximal esophageal stricture. Estimated blood loss                            was minimal.  Patchy, white plaques were found in the entire                            esophagus. Biopsies were taken with a cold forceps                            for histology. Estimated blood loss was minimal.                           The Z-line was regular and was found 40 cm from the                            incisors.                           Scattered mild inflammation characterized by                            erythema was found in the entire examined stomach.                            Biopsies were taken with a cold forceps for                            Helicobacter pylori testing. Estimated blood loss                            was minimal.                           The examined duodenum was normal. Complications:            No immediate complications. Estimated Blood Loss:     Estimated blood loss was minimal. Impression:               - Benign-appearing esophageal stenosis. Esophageal                            dilated with 54 Fr Maloney dilator with  appropriate                            mucosal rent in the proximal esophagus, consistent                            with successful dilation.                           - Esophageal plaques were found, suspicious for                            candidiasis. Biopsied.                           - Z-line regular, 40 cm from the incisors.                           -  Gastritis. Biopsied.                           - Normal examined duodenum. Recommendation:           - Patient has a contact number available for                            emergencies. The signs and symptoms of potential                            delayed complications were discussed with the                            patient. Return to normal activities tomorrow.                            Written discharge instructions were provided to the                            patient.                           - Soft diet today.                           - Continue present medications.                           - Await pathology results.                           - Diflucan (fluconazole) 200 mg on day 1, then 100                            mg PO daily for 3 weeks.                           - Return to GI clinic at appointment to be                            scheduled. Gerrit Heck, MD 06/23/2020 8:32:14 AM

## 2020-06-26 ENCOUNTER — Telehealth: Payer: Self-pay | Admitting: Gastroenterology

## 2020-06-26 ENCOUNTER — Telehealth: Payer: Self-pay | Admitting: Cardiology

## 2020-06-26 NOTE — Telephone Encounter (Signed)
Patient states he started hearing a clicking Saturday. He states he is not sure if it was due to his endoscopy on Friday. He states the clicking sounds like it is coming from under his breast bone, where his stomach and chest meet. He states it sounds like it is in rhythm with his heart beat. He also states the first time it happened it lasted 45 seconds and that was the longest it lasted. He states he also felt lightheaded the first time, but did not faint. She states he is not having any other symptoms. He states [name] had told him something about a heart valve and his family does have a history of heart valve problems.

## 2020-06-26 NOTE — Telephone Encounter (Signed)
Pt is requesting a call back from a nurse regarding a "clicking" noise he is hearing which it becoming in tune with his heartbeat. Pt states it seems to come from under his chest which occurs every now and then. Pt had an EGD on 1/28.

## 2020-06-26 NOTE — Telephone Encounter (Signed)
Spoke to patient who reports having a "clicking" sound coming from his chest. The clicking happens at the same time as his heart beat. Patient thought that this clicking sound was due to his recent EGD on 06/23/20.This does not sound to be related to his EGD,and patient was advised to contact his pulmonologist. Patient voiced understanding.

## 2020-06-26 NOTE — Telephone Encounter (Signed)
Spoke with pt who reports having had an endoscopy on Friday.  Saturday he heard a clicking sound from his chest.  It lasted about 45 seconds.  He reports feeling a little SOB at the time but for the most part has SOB all the time d/t COPD.  He also states he has some fleeting lightheadedness.  He is calling here to ask Dr Marlou Porch about this because he "remembers Dr Marlou Porch saying something about heart valves" the last time he was here.  Advised pt, in review of his most recent 2 D Echo all heart valves were normal.  Pt would like Dr Marlou Porch to be aware of this because of his family history for heart valve problems.  Pt aware I will forward this information to Dr Marlou Porch for his knowledge and review.

## 2020-06-27 ENCOUNTER — Telehealth: Payer: Self-pay | Admitting: *Deleted

## 2020-06-27 NOTE — Telephone Encounter (Signed)
  Follow up Call-  Call back number 06/23/2020 04/12/2020 03/02/2019  Post procedure Call Back phone  # [phone]  Permission to leave phone message Yes Yes Yes  Some recent data might be hidden     Patient questions:  Do you have a fever, pain , or abdominal swelling? No. Pain Score  0 *  Have you tolerated food without any problems? Yes.    Have you been able to return to your normal activities? Yes.    Do you have any questions about your discharge instructions: Diet   No. Medications  No. Follow up visit  No.  Do you have questions or concerns about your Care? Yes.     Patient has continued to have a few brief episodes of this mysterious clicking noise ( see previous phone note). Awaiting call from cardiologist.  Wanted you to be aware this was something new that started after EGD.  Actions: * If pain score is 4 or above: Physician/ provider Notified : Vito Cirigliano, DO   1. Have you developed a fever since your procedure? no  2.   Have you had an respiratory symptoms (SOB or cough) since your procedure? no  3.   Have you tested positive for COVID 19 since your procedure no  4.   Have you had any family members/close contacts diagnosed with the COVID 19 since your procedure?  no   If yes to any of these questions please route to Joylene Breaker, RN and Joella Prince, Therapist, sports.

## 2020-06-27 NOTE — Telephone Encounter (Signed)
Reviewed recent ECHO as well and valves are normal. Reassuring. Candee Furbish, MD

## 2020-06-27 NOTE — Telephone Encounter (Signed)
Pt aware of Dr Marlou Porch review and that no new orders are given.  Advised to f/u with PCP if continues.

## 2020-06-28 DIAGNOSIS — L03113 Cellulitis of right upper limb: Secondary | ICD-10-CM | POA: Diagnosis not present

## 2020-06-28 NOTE — Telephone Encounter (Signed)
Thanks for the update. Nothing that I am aware of that would relate a recent EGD and a "clicking sound" in the chest. Glad he had his recent echo reviewed and valves normal per review of Cardiology notes.

## 2020-07-03 ENCOUNTER — Other Ambulatory Visit: Payer: Self-pay | Admitting: Internal Medicine

## 2020-07-05 DIAGNOSIS — L03113 Cellulitis of right upper limb: Secondary | ICD-10-CM | POA: Diagnosis not present

## 2020-07-06 NOTE — Telephone Encounter (Signed)
Gave the patient results to his bx. Pt verbalized understanding and is still taking his medication prescribed. will contact the office if needed

## 2020-07-06 NOTE — Telephone Encounter (Signed)
-----   Message from [place], DO sent at 07/04/2020  4:19 PM EST ----- -The biopsies taken from the stomach were normal, without any evidence of H. pylori infection -The biopsies taken from your esophagus confirmed Candida esophagitis.  Complete course of fluconazole as prescribed at the time of upper endoscopy.

## 2020-07-07 ENCOUNTER — Telehealth: Payer: Self-pay | Admitting: Gastroenterology

## 2020-07-07 NOTE — Telephone Encounter (Signed)
Spoke to patient to inform him of recent biopsy results. He is currently taking Fluconazole for esophageal candida. All questions answered. Patient voiced understanding.

## 2020-07-07 NOTE — Telephone Encounter (Signed)
Calling for lab results. °

## 2020-07-17 DIAGNOSIS — M1811 Unilateral primary osteoarthritis of first carpometacarpal joint, right hand: Secondary | ICD-10-CM | POA: Diagnosis not present

## 2020-07-17 DIAGNOSIS — M542 Cervicalgia: Secondary | ICD-10-CM | POA: Diagnosis not present

## 2020-07-17 DIAGNOSIS — M79671 Pain in right foot: Secondary | ICD-10-CM | POA: Diagnosis not present

## 2020-07-27 DIAGNOSIS — M19031 Primary osteoarthritis, right wrist: Secondary | ICD-10-CM | POA: Diagnosis not present

## 2020-07-27 DIAGNOSIS — M25511 Pain in right shoulder: Secondary | ICD-10-CM | POA: Diagnosis not present

## 2020-07-28 ENCOUNTER — Other Ambulatory Visit: Payer: Self-pay | Admitting: Orthopedic Surgery

## 2020-07-28 ENCOUNTER — Telehealth: Payer: Self-pay

## 2020-07-28 NOTE — Telephone Encounter (Signed)
   Primary Cardiologist: Candee Furbish, MD  Chart reviewed as part of pre-operative protocol coverage. Mr. Levi [name] has a hx of PAF first noted in 04/2012 and again in 2016. He has been maintained on low dose CCB and aspirin therapy. Prior cardiac cath 2010 with no evidence of obstructive coronary artery disease. Additional hx includes COPD, agent orange exposure. He had mild reduction in LVEF 40-45% by echo 10/2017 though escalation of medical therapy has been limited by relative hypotension. He was last seen 03/28/20 by [name], NP and noted atypical chest pain which was more consistent with musculoskeletal etiology and recommended for follow up in 6 months.   Will route to primary cardiologist for recommendations regarding Aspirin.   Loel Dubonnet, NP 07/28/2020, 4:26 PM

## 2020-07-28 NOTE — Telephone Encounter (Signed)
   [facility] Pre-operative Risk Assessment    HEARTCARE STAFF: - Please ensure there is not already an duplicate clearance open for this procedure. - Under Visit Info/Reason for Call, type in Other and utilize the format Clearance MM/DD/YY or Clearance TBD. Do not use dashes or single digits. - If request is for dental extraction, please clarify the # of teeth to be extracted.  Request for surgical clearance:  1. What type of surgery is being performed? RIGHT HAND LIGAMENT RECONSTRUCTION AND TENDON INTERPOSTION   2. When is this surgery scheduled? 08-07-20   3. What type of clearance is required (medical clearance vs. Pharmacy clearance to hold med vs. Both)? BOTH  4. Are there any medications that need to be held prior to surgery and how long?ASA NEEDS INSTRUCTIONS   5. Practice name and name of physician performing surgery? [facility]   6. What is the office phone number? [phone]   7.   What is the office fax number? [place]   Anesthesia type (None, local, MAC, general) ? CHOICE   Jacinta Shoe 07/28/2020, 4:03 PM  _________________________________________________________________   (provider comments below)

## 2020-07-31 ENCOUNTER — Encounter (HOSPITAL_BASED_OUTPATIENT_CLINIC_OR_DEPARTMENT_OTHER): Payer: Self-pay | Admitting: Orthopedic Surgery

## 2020-07-31 ENCOUNTER — Other Ambulatory Visit: Payer: Self-pay

## 2020-08-02 ENCOUNTER — Other Ambulatory Visit: Payer: Self-pay

## 2020-08-02 ENCOUNTER — Encounter (HOSPITAL_BASED_OUTPATIENT_CLINIC_OR_DEPARTMENT_OTHER): Payer: Self-pay

## 2020-08-02 ENCOUNTER — Emergency Department (HOSPITAL_BASED_OUTPATIENT_CLINIC_OR_DEPARTMENT_OTHER): Payer: Medicare Other

## 2020-08-02 ENCOUNTER — Emergency Department (HOSPITAL_BASED_OUTPATIENT_CLINIC_OR_DEPARTMENT_OTHER)
Admission: EM | Admit: 2020-08-02 | Discharge: 2020-08-02 | Disposition: A | Discharge status: Home / Self Care | Payer: Medicare Other | Attending: Emergency Medicine | Admitting: Emergency Medicine

## 2020-08-02 DIAGNOSIS — S81802A Unspecified open wound, left lower leg, initial encounter: Secondary | ICD-10-CM | POA: Insufficient documentation

## 2020-08-02 DIAGNOSIS — X58XXXA Exposure to other specified factors, initial encounter: Secondary | ICD-10-CM | POA: Insufficient documentation

## 2020-08-02 DIAGNOSIS — J4540 Moderate persistent asthma, uncomplicated: Secondary | ICD-10-CM | POA: Insufficient documentation

## 2020-08-02 DIAGNOSIS — Z96643 Presence of artificial hip joint, bilateral: Secondary | ICD-10-CM | POA: Diagnosis not present

## 2020-08-02 DIAGNOSIS — S81812A Laceration without foreign body, left lower leg, initial encounter: Secondary | ICD-10-CM | POA: Diagnosis not present

## 2020-08-02 DIAGNOSIS — Z96653 Presence of artificial knee joint, bilateral: Secondary | ICD-10-CM | POA: Insufficient documentation

## 2020-08-02 DIAGNOSIS — Z96612 Presence of left artificial shoulder joint: Secondary | ICD-10-CM | POA: Insufficient documentation

## 2020-08-02 DIAGNOSIS — L03116 Cellulitis of left lower limb: Secondary | ICD-10-CM | POA: Diagnosis not present

## 2020-08-02 DIAGNOSIS — M79605 Pain in left leg: Secondary | ICD-10-CM | POA: Diagnosis not present

## 2020-08-02 DIAGNOSIS — M7989 Other specified soft tissue disorders: Secondary | ICD-10-CM | POA: Diagnosis not present

## 2020-08-02 DIAGNOSIS — J449 Chronic obstructive pulmonary disease, unspecified: Secondary | ICD-10-CM | POA: Insufficient documentation

## 2020-08-02 DIAGNOSIS — Z7982 Long term (current) use of aspirin: Secondary | ICD-10-CM | POA: Insufficient documentation

## 2020-08-02 DIAGNOSIS — I509 Heart failure, unspecified: Secondary | ICD-10-CM | POA: Insufficient documentation

## 2020-08-02 DIAGNOSIS — R600 Localized edema: Secondary | ICD-10-CM | POA: Diagnosis not present

## 2020-08-02 DIAGNOSIS — Z79899 Other long term (current) drug therapy: Secondary | ICD-10-CM | POA: Diagnosis not present

## 2020-08-02 DIAGNOSIS — S8992XA Unspecified injury of left lower leg, initial encounter: Secondary | ICD-10-CM | POA: Diagnosis present

## 2020-08-02 LAB — BASIC METABOLIC PANEL
Anion gap: 10 (ref 5–15)
BUN: 22 mg/dL (ref 8–23)
CO2: 26 mmol/L (ref 22–32)
Calcium: 9 mg/dL (ref 8.9–10.3)
Chloride: 100 mmol/L (ref 98–111)
Creatinine, Ser: 0.86 mg/dL (ref 0.61–1.24)
GFR, Estimated: >60 mL/min (ref >60)
Glucose, Bld: 108 mg/dL — ABNORMAL HIGH (ref 70–99)
Potassium: 4 mmol/L (ref 3.5–5.1)
Sodium: 136 mmol/L (ref 135–145)

## 2020-08-02 LAB — CBC WITH DIFFERENTIAL/PLATELET
Abs Immature Granulocytes: 0.55 10*3/uL — ABNORMAL HIGH (ref 0.00–0.07)
Basophils Absolute: 0.1 10*3/uL (ref 0.0–0.1)
Basophils Relative: 0 %
Eosinophils Absolute: 0 10*3/uL (ref 0.0–0.5)
Eosinophils Relative: 0 %
HCT: 42.3 % (ref 39.0–52.0)
Hemoglobin: 14.3 g/dL (ref 13.0–17.0)
Immature Granulocytes: 4 %
Lymphocytes Relative: 8 %
Lymphs Abs: 1.1 10*3/uL (ref 0.7–4.0)
MCH: 32.9 pg (ref 26.0–34.0)
MCHC: 33.8 g/dL (ref 30.0–36.0)
MCV: 97.5 fL (ref 80.0–100.0)
Monocytes Absolute: 1.3 10*3/uL — ABNORMAL HIGH (ref 0.1–1.0)
Monocytes Relative: 9 %
Neutro Abs: 10.8 10*3/uL — ABNORMAL HIGH (ref 1.7–7.7)
Neutrophils Relative %: 79 %
Platelets: 131 10*3/uL — ABNORMAL LOW (ref 150–400)
RBC: 4.34 MIL/uL (ref 4.22–5.81)
RDW: 15 % (ref 11.5–15.5)
WBC: 13.7 10*3/uL — ABNORMAL HIGH (ref 4.0–10.5)
nRBC: 0 % (ref 0.0–0.2)

## 2020-08-02 MED ORDER — CIPROFLOXACIN HCL 500 MG PO TABS: 500.0000 mg | ORAL_TABLET | Freq: Two times a day (BID) | ORAL | 0 refills | Status: DC

## 2020-08-02 MED ORDER — SODIUM CHLORIDE 0.9 % IV SOLN
1.0000 g | Freq: Once | INTRAVENOUS | Status: AC
  Administered 2020-08-02: 1 g via INTRAVENOUS
  Filled 2020-08-02: qty 10

## 2020-08-02 NOTE — ED Provider Notes (Signed)
[facility] Provider Note   CSN: [id number] Arrival date & time: 08/02/20  1637     History Chief Complaint  Patient presents with  . Leg Problem    Levi [name] is a 75 y.o. male.  He is on chronic prednisone.  He says his legs swell from time to time.  He had increased swelling in his left leg and he said it burst open yesterday.  He denies any trauma.  Saw his PCP who told him to come to the ER.  Complaining of a throbbing pain extending from the wound up to about the level of his knee.  Denies trauma.  No chest pain or shortness of breath.  Has had cellulitis in the past.  The history is provided by the patient.  Leg Pain Location:  Leg Leg location:  L lower leg Pain details:    Quality:  Throbbing   Severity:  Moderate   Onset quality:  Gradual   Duration:  1 day   Timing:  Constant   Progression:  Unchanged Chronicity:  New Foreign body present:  No foreign bodies Relieved by:  None tried Worsened by:  Activity Ineffective treatments:  None tried Associated symptoms: swelling   Associated symptoms: no fever and no neck pain        Past Medical History:  Diagnosis Date  . Agent orange exposure 1970's   takes Imdur,Diltiazem, and Atacand daily  . ALLERGIC RHINITIS   . Allergy    seasonal allergies  . Asthma    uses inhaler and nebulizers  . Bruises easily    d/t prednisone daily  . Cataract   . CHF (congestive heart failure) ([place])    dx-on meds to treat  . Chronic bronchitis ([place])    "used to get it q yr; last time ?2008" (04/28/2012)  . COPD (chronic obstructive pulmonary disease) ([facility])    agent orange exposure  . Degenerative disk disease    "everywhere" (04/28/2012)  . Diverticulitis   . Dysrhythmia    afib  . Elevated uric acid in blood    takes Allopurinol daily  . Emphysema of lung ([place])   . Enlarged prostate    but not on any meds  . GERD (gastroesophageal reflux disease)    takes Protonix daily  . H/O hiatal  hernia   . Headache   . History of pneumonia    a. 2010  . Hyperlipidemia    takes Lipitor daily; pt. states he takes a preventive  . Joint pain   . Joint swelling   . Neuromuscular disorder ([facility])    bilat legs, and bilat arms  . PAF (paroxysmal atrial fibrillation) ([place])    a. Dx 04/2012, CHA2DS2VASc = 1 (age);  b. 04/2012 Echo: EF 40-50%, mild MR.  . Personal history of colonic adenomas 02/09/2013  . Shortness of breath dyspnea   . Thyroid disease    no taking meds-caused dizziness    Patient Active Problem List   Diagnosis Date Noted  . Cellulitis 03/22/2020  . COPD (chronic obstructive pulmonary disease) ([place]) 03/21/2020  . Chest wall pain 09/21/2019  . GERD (gastroesophageal reflux disease) 12/03/2017  . Pneumothorax, left 04/11/2017  . Lumbar stenosis with neurogenic claudication 04/07/2017  . Primary osteoarthritis of right hip 07/31/2015  . Primary osteoarthritis of right knee 06/17/2014  . Thrush of mouth and esophagus ([place]) 04/03/2014  . Palpitation 03/28/2014  . Pseudoarthrosis of lumbar spine 06/15/2013  . Hyperlipidemia   . History of  colonic polyps 02/09/2013  . Atrial fibrillation ([place]) 05/11/2012  . Osteoarthritis of left knee 01/10/2012  . Seasonal and perennial allergic rhinitis 07/23/2010  . Asthma, moderate persistent 03/06/2009    Past Surgical History:  Procedure Laterality Date  . ANTERIOR CERVICAL DECOMP/DISCECTOMY FUSION N/A 07/21/2012   Procedure: ANTERIOR CERVICAL DECOMPRESSION/DISCECTOMY FUSION 2 LEVELS;  Surgeon: [name], MD;  Location: [facility];  Service: Neurosurgery;  Laterality: N/A;  C4-5 C5-6 Anterior cervical decompression/diskectomy/fusion  . ANTERIOR LAT LUMBAR FUSION N/A 04/07/2017   Procedure: Thoracic twelve-Lumbar one Anterolateral decompression/fusion;  Surgeon: [name], MD;  Location: [facility];  Service: Neurosurgery;  Laterality: N/A;  . BACK SURGERY     x 3  . CARDIAC CATHETERIZATION  11/2008   [name] - 20%  calcified non flow limiting left main, 50% EF apical hypokinesis  . CERVICAL DISC ARTHROPLASTY N/A 04/07/2017   Procedure: Cervical six-seven Disc arthroplasty;  Surgeon: [name], MD;  Location: [facility];  Service: Neurosurgery;  Laterality: N/A;  . CHEST TUBE INSERTION Left 04/11/2017   Procedure: CHEST TUBE INSERTION;  Surgeon: [name], MD;  Location: [facility];  Service: Thoracic;  Laterality: Left;  . CHOLECYSTECTOMY    . COLONOSCOPY  2016   CG-MAC-miralax(good)servere TICS/TA x 2  . ESOPHAGOGASTRODUODENOSCOPY    . EYE SURGERY Bilateral 2011   steroidal encapsulation" (04/28/2012)  . FOOT SURGERY Right    x 2  . KNEE ARTHROSCOPY Bilateral   . LATERAL / POSTERIOR COMBINED FUSION LUMBAR SPINE  2012  . POLYPECTOMY  2016   severe TICS/TA x 2  . REVERSE SHOULDER ARTHROPLASTY  04/28/2012   Procedure: REVERSE SHOULDER ARTHROPLASTY;  Surgeon: [name], MD;  Location: [facility];  Service: Orthopedics;  Laterality: Left;  Left shouder reverse total shoulder arthroplasty  . SHOULDER SURGERY    . [place]  . TOTAL HIP ARTHROPLASTY Left 2011   "left" (04/28/2012)  . TOTAL HIP ARTHROPLASTY Right 07/31/2015   Procedure: TOTAL HIP ARTHROPLASTY ANTERIOR APPROACH;  Surgeon: [name], MD;  Location: [facility];  Service: Orthopedics;  Laterality: Right;  . TOTAL KNEE ARTHROPLASTY  01/10/2012   Procedure: TOTAL KNEE ARTHROPLASTY;  Surgeon: [name], MD;  Location: [facility];  Service: Orthopedics;;  left total knee arthroplasty  . TOTAL KNEE ARTHROPLASTY Right 06/17/2014   Procedure: TOTAL KNEE ARTHROPLASTY;  Surgeon: [name], MD;  Location: [facility];  Service: Orthopedics;  Laterality: Right;       Family History  Problem Relation Age of Onset  . Heart disease Father        Died 74, MI  . Diabetes Mother   . Prostate cancer Brother   . Colon polyps Brother   . Colon polyps Brother   . Colon cancer Other   . Esophageal cancer Neg Hx   . Rectal  cancer Neg Hx   . Stomach cancer Neg Hx   . Pancreatic cancer Neg Hx   . Kidney disease Neg Hx   . Liver disease Neg Hx     Social History   Tobacco Use  . Smoking status: Never Smoker  . Smokeless tobacco: Never Used  Vaping Use  . Vaping Use: Never used  Substance Use Topics  . Alcohol use: No    Alcohol/week: 0.0 standard drinks  . Drug use: No    Home Medications Prior to Admission medications   Medication Sig Start Date End Date Taking? Authorizing Provider  albuterol (VENTOLIN HFA) 108 (90 Base)  MCG/ACT inhaler Inhale 2 puffs into the lungs every 4 (four) hours as needed for wheezing or shortness of breath. 09/21/19   Baird Lyons D, MD  allopurinol (ZYLOPRIM) 100 MG tablet Take 100 mg by mouth daily.    [provider]  aspirin EC 81 MG tablet Take 1 tablet (81 mg total) by mouth daily. 10/10/15   Jerline Pain, MD  atorvastatin (LIPITOR) 20 MG tablet TAKE 1 TABLET BY MOUTH EVERY DAY 05/08/20   Jerline Pain, MD  candesartan (ATACAND) 4 MG tablet TAKE 1/2 TABLETS (2 MG TOTAL) BY MOUTH DAILY. 03/28/20   Jerline Pain, MD  cyclobenzaprine (FLEXERIL) 10 MG tablet Take 10 mg by mouth at bedtime as needed for muscle spasms.    [provider]  diltiazem (CARDIZEM CD) 180 MG 24 hr capsule TAKE 1 CAPSULE BY MOUTH EVERY DAY 09/28/19   Burtis Junes, NP  diltiazem (CARDIZEM) 30 MG tablet 1 tablet    [provider]  furosemide (LASIX) 20 MG tablet Take 1 tablet (20 mg total) by mouth daily as needed. For shortness of breath and lower limb edema 11/17/18   Jerline Pain, MD  ipratropium-albuterol (DUONEB) 0.5-2.5 (3) MG/3ML SOLN 1 NEBULE EVERY 6 HOURS AS NEEDED. **J45.3** 03/22/20   Young, Tarri Fuller D, MD  isosorbide mononitrate (IMDUR) 30 MG 24 hr tablet TAKE 1 TABLET (30 MG TOTAL) BY MOUTH DAILY. 02/08/20   Jerline Pain, MD  Nebulizers (COMPRESSOR/NEBULIZER) MISC Use as directed 12/01/17   Deneise Lever, MD  omeprazole (PRILOSEC) 40 MG capsule Take 1  capsule (40 mg total) by mouth 2 (two) times daily before a meal. 02/11/20   Gatha Mayer, MD  predniSONE (DELTASONE) 10 MG tablet Take 2 tablets (20 mg total) by mouth daily. continuous 04/24/20   Baird Lyons D, MD  STRIVERDI RESPIMAT 2.5 MCG/ACT AERS INHALE 2 PUFFS BY MOUTH INTO THE LUNGS DAILY 07/03/20   Baird Lyons D, MD  tamsulosin (FLOMAX) 0.4 MG CAPS capsule Take 0.4 mg by mouth at bedtime.  10/13/15   [provider]    Allergies    Morphine and related, Levaquin [levofloxacin], and Fluticasone-salmeterol  Review of Systems   Review of Systems  Constitutional: Negative for fever.  HENT: Negative for sore throat.   Eyes: Negative for visual disturbance.  Respiratory: Negative for shortness of breath.   Cardiovascular: Positive for leg swelling. Negative for chest pain.  Gastrointestinal: Negative for abdominal pain.  Genitourinary: Negative for dysuria.  Musculoskeletal: Negative for neck pain.  Skin: Positive for wound. Negative for rash.  Neurological: Negative for headaches.    Physical Exam Updated Vital Signs BP (!) 142/86 (BP Location: Left Arm)   Pulse 87   Temp 97.6 F (36.4 C) (Oral)   Resp 20   Ht _0  (1.88 m)   Wt 80.3 kg   SpO2 99%   BMI 22.73 kg/m   Physical Exam Vitals and nursing note reviewed.  Constitutional:      Appearance: He is well-developed and well-nourished.  HENT:     Head: Normocephalic and atraumatic.  Eyes:     Conjunctiva/sclera: Conjunctivae normal.  Cardiovascular:     Rate and Rhythm: Normal rate and regular rhythm.     Heart sounds: No murmur heard.   Pulmonary:     Effort: Pulmonary effort is normal. No respiratory distress.     Breath sounds: Normal breath sounds.  Abdominal:     Palpations: Abdomen is soft.  Tenderness: There is no abdominal tenderness.  Musculoskeletal:        General: Swelling and signs of injury present. No edema.     Cervical back: Neck supple.     Comments: Left lower  extremity is moderately swollen.  There is a hemorrhagic area and a skin tear about midway down on the lateral aspect.  There are some ascending erythema and warmth and tenderness up to about the level of the knee.  No crepitus.  Skin:    General: Skin is warm and dry.  Neurological:     General: No focal deficit present.     Mental Status: He is alert.  Psychiatric:        Mood and Affect: Mood and affect normal.       ED Results / Procedures / Treatments   Labs (all labs ordered are listed, but only abnormal results are displayed) Labs Reviewed  CULTURE, BLOOD (ROUTINE X 2)  CULTURE, BLOOD (ROUTINE X 2)  BASIC METABOLIC PANEL  CBC WITH DIFFERENTIAL/PLATELET    EKG None  Radiology No results found.  Procedures Procedures   Medications Ordered in ED Medications  cefTRIAXone (ROCEPHIN) 1 g in sodium chloride 0.9 % 100 mL IVPB (0 g Intravenous Stopped 08/02/20 2016)    ED Course  I have reviewed the triage vital signs and the nursing notes.  Pertinent labs & imaging results that were available during my care of the patient were reviewed by me and considered in my medical decision making (see chart for details).  Clinical Course as of 08/03/20 1020  Wed Aug 02, 2020  1918 Patient's allergy lists him as being allergic to Levaquin.  He said he is taking Cipro as of a few months ago and did not have any problems with it. [MB]  1954 Ultrasound showing no evidence of DVT.  Does have an elevated white count.  No fever here.  Recommended admission for continued IV antibiotics due to his immune suppression.  Patient does not wish to be admitted.  Understands could have a worsening condition.  He said when he had the cellulitis in the other leg they treated him with Cipro and that worked. [MB]    Clinical Course User Index [MB] Hayden Rasmussen, MD   MDM Rules/Calculators/A&P                         This patient complains of left leg swelling and wound; this involves an  extensive number of treatment Options and is a complaint that carries with it a high risk of complications and Morbidity. The differential includes DVT, peripheral edema, cellulitis, contusion  I ordered, reviewed and interpreted labs, which included CBC with elevated white count, normal hemoglobin, platelets slightly low, chemistries normal, blood cultures obtained I ordered medication IV antibiotics I ordered imaging studies which included duplex left lower extremity and I independently    visualized and interpreted imaging which showed no acute DVT Additional history obtained from patient's wife Previous records obtained and reviewed prior right leg infection positive Serratia, resistant to Keflex  After the interventions stated above, I reevaluated the patient and found patient to be hemodynamically stable.  Recommended admission for cellulitis due to patient's immunosuppression.  Patient declines admission.  Patient treatment with Cipro and close follow-up with PCP.  His prior infection was responsive to Cipro.  Return instructions discussed   Final Clinical Impression(s) / ED Diagnoses Final diagnoses:  Wound of left lower extremity, initial encounter  Cellulitis of left leg    Rx / DC Orders ED Discharge Orders         Ordered    ciprofloxacin (CIPRO) 500 MG tablet  2 times daily        08/02/20 2013           Hayden Rasmussen, MD 08/03/20 1022

## 2020-08-02 NOTE — Telephone Encounter (Signed)
Left VM

## 2020-08-02 NOTE — Telephone Encounter (Signed)
Ramello is calling stating he is returning Angela's call. Please advise.

## 2020-08-02 NOTE — Telephone Encounter (Signed)
Patient returning call.

## 2020-08-02 NOTE — Discharge Instructions (Signed)
You were seen for a wound to your left lower leg and pain and swelling above without.  You had an ultrasound that did not show any signs of blood clot.  We are treating you for a cellulitis.  He received an IV dose of antibiotics and we are sending a prescription to your pharmacy for more antibiotic pills.  If you see that the redness continues to extend up or if you experience high fever he will need to be admitted to the hospital.

## 2020-08-02 NOTE — ED Triage Notes (Addendum)
Pt reports left lower leg "broke open" ~6pm yesterday-denies injury-states he was seen by PCP today-states area is worse-was advised by PCP to come to ED-NAD-slow limping gait-state he uses cane-not using at this time

## 2020-08-02 NOTE — Telephone Encounter (Signed)
   Primary Cardiologist: Candee Furbish, MD  Chart reviewed as part of pre-operative protocol coverage. Patient was contacted 08/02/2020 in reference to pre-operative risk assessment for pending surgery as outlined below.  Levi [name] was last seen on 03/28/20 by Levi Merle NP.  Since that day, Levi [name] has done well.  He denies chest pain. He exercises on the elliptical 2 hrs per day without angina. Per [name], [place] to hold ASA prior to procedure.   Therefore, based on ACC/AHA guidelines, the patient would be at acceptable risk for the planned procedure without further cardiovascular testing.   The patient was advised that if he develops new symptoms prior to surgery to contact our office to arrange for a follow-up visit, and he verbalized understanding.  I will route this recommendation to the requesting party via Epic fax function and remove from pre-op pool. Please call with questions.  [place], PA 08/02/2020, 2:08 PM

## 2020-08-02 NOTE — Telephone Encounter (Signed)
Ok to hold ASA  Thanks Candee Furbish, MD

## 2020-08-03 ENCOUNTER — Inpatient Hospital Stay (HOSPITAL_COMMUNITY): Admission: RE | Admit: 2020-08-03 | Payer: Medicare Other | Source: Ambulatory Visit

## 2020-08-03 ENCOUNTER — Telehealth: Payer: Self-pay | Admitting: Internal Medicine

## 2020-08-03 NOTE — Telephone Encounter (Signed)
Call returned to [facility] with [facility] [place], confirmed patient DOB. She reports the patient is having some type of orthopedic surgery and the patient told them that his pulmonary MD cleared him for surgery. I inquired as to whether a form was sent over or are they asking for clearance. She reports the patient was coming in for pre-admission tomorrow and they were covering all bases. I made here aware the patient has not had an appt since 03/22/20 however I would ask MD if he would be willing to give surgical clearance. Tonia Ghent states she will call back if needed once Dr Berline Lopes see's the patient tomorrow.   I made her aware since the patient has not been seen since 03/22/20 he would like need a visit. Voiced understanding.   Nothing further needed at this time.

## 2020-08-04 ENCOUNTER — Other Ambulatory Visit (HOSPITAL_COMMUNITY)
Admission: RE | Admit: 2020-08-04 | Discharge: 2020-08-04 | Disposition: A | Discharge status: Home / Self Care | Payer: Medicare Other | Source: Ambulatory Visit | Attending: Orthopedic Surgery | Admitting: Orthopedic Surgery

## 2020-08-04 ENCOUNTER — Encounter (HOSPITAL_BASED_OUTPATIENT_CLINIC_OR_DEPARTMENT_OTHER)
Admission: RE | Admit: 2020-08-04 | Discharge: 2020-08-04 | Disposition: A | Discharge status: Home / Self Care | Payer: Medicare Other | Source: Ambulatory Visit | Attending: Orthopedic Surgery | Admitting: Orthopedic Surgery

## 2020-08-04 DIAGNOSIS — Z01812 Encounter for preprocedural laboratory examination: Secondary | ICD-10-CM | POA: Insufficient documentation

## 2020-08-04 DIAGNOSIS — Z20822 Contact with and (suspected) exposure to covid-19: Secondary | ICD-10-CM | POA: Insufficient documentation

## 2020-08-04 DIAGNOSIS — L03116 Cellulitis of left lower limb: Secondary | ICD-10-CM | POA: Diagnosis not present

## 2020-08-04 LAB — BASIC METABOLIC PANEL
Anion gap: 7 (ref 5–15)
BUN: 15 mg/dL (ref 8–23)
CO2: 28 mmol/L (ref 22–32)
Calcium: 9.2 mg/dL (ref 8.9–10.3)
Chloride: 99 mmol/L (ref 98–111)
Creatinine, Ser: 0.89 mg/dL (ref 0.61–1.24)
GFR, Estimated: >60 mL/min (ref >60)
Glucose, Bld: 106 mg/dL — ABNORMAL HIGH (ref 70–99)
Potassium: 4.1 mmol/L (ref 3.5–5.1)
Sodium: 134 mmol/L — ABNORMAL LOW (ref 135–145)

## 2020-08-04 LAB — SARS CORONAVIRUS 2 (TAT 6-24 HRS): SARS Coronavirus 2: NEGATIVE

## 2020-08-07 ENCOUNTER — Ambulatory Visit (HOSPITAL_BASED_OUTPATIENT_CLINIC_OR_DEPARTMENT_OTHER): Payer: Medicare Other | Admitting: Anesthesiology

## 2020-08-07 ENCOUNTER — Encounter (HOSPITAL_BASED_OUTPATIENT_CLINIC_OR_DEPARTMENT_OTHER): Payer: Self-pay | Admitting: Orthopedic Surgery

## 2020-08-07 ENCOUNTER — Other Ambulatory Visit: Payer: Self-pay

## 2020-08-07 ENCOUNTER — Encounter (HOSPITAL_BASED_OUTPATIENT_CLINIC_OR_DEPARTMENT_OTHER)
Admission: RE | Disposition: A | Discharge status: Home / Self Care | Payer: Self-pay | Source: Home / Self Care | Attending: Orthopedic Surgery

## 2020-08-07 ENCOUNTER — Ambulatory Visit (HOSPITAL_BASED_OUTPATIENT_CLINIC_OR_DEPARTMENT_OTHER)
Admission: RE | Admit: 2020-08-07 | Discharge: 2020-08-07 | Disposition: A | Discharge status: Home / Self Care | Payer: Medicare Other | Attending: Orthopedic Surgery | Admitting: Orthopedic Surgery

## 2020-08-07 DIAGNOSIS — Z800 Family history of malignant neoplasm of digestive organs: Secondary | ICD-10-CM | POA: Diagnosis not present

## 2020-08-07 DIAGNOSIS — Z8371 Family history of colonic polyps: Secondary | ICD-10-CM | POA: Diagnosis not present

## 2020-08-07 DIAGNOSIS — Z7982 Long term (current) use of aspirin: Secondary | ICD-10-CM | POA: Insufficient documentation

## 2020-08-07 DIAGNOSIS — Z8249 Family history of ischemic heart disease and other diseases of the circulatory system: Secondary | ICD-10-CM | POA: Diagnosis not present

## 2020-08-07 DIAGNOSIS — Z885 Allergy status to narcotic agent status: Secondary | ICD-10-CM | POA: Insufficient documentation

## 2020-08-07 DIAGNOSIS — Z888 Allergy status to other drugs, medicaments and biological substances status: Secondary | ICD-10-CM | POA: Diagnosis not present

## 2020-08-07 DIAGNOSIS — Z96653 Presence of artificial knee joint, bilateral: Secondary | ICD-10-CM | POA: Insufficient documentation

## 2020-08-07 DIAGNOSIS — Z7952 Long term (current) use of systemic steroids: Secondary | ICD-10-CM | POA: Diagnosis not present

## 2020-08-07 DIAGNOSIS — Z981 Arthrodesis status: Secondary | ICD-10-CM | POA: Insufficient documentation

## 2020-08-07 DIAGNOSIS — Z96612 Presence of left artificial shoulder joint: Secondary | ICD-10-CM | POA: Insufficient documentation

## 2020-08-07 DIAGNOSIS — Z833 Family history of diabetes mellitus: Secondary | ICD-10-CM | POA: Diagnosis not present

## 2020-08-07 DIAGNOSIS — Z881 Allergy status to other antibiotic agents status: Secondary | ICD-10-CM | POA: Diagnosis not present

## 2020-08-07 DIAGNOSIS — M1811 Unilateral primary osteoarthritis of first carpometacarpal joint, right hand: Secondary | ICD-10-CM | POA: Diagnosis not present

## 2020-08-07 DIAGNOSIS — G8918 Other acute postprocedural pain: Secondary | ICD-10-CM | POA: Diagnosis not present

## 2020-08-07 DIAGNOSIS — Z79899 Other long term (current) drug therapy: Secondary | ICD-10-CM | POA: Insufficient documentation

## 2020-08-07 DIAGNOSIS — E785 Hyperlipidemia, unspecified: Secondary | ICD-10-CM | POA: Diagnosis not present

## 2020-08-07 DIAGNOSIS — I480 Paroxysmal atrial fibrillation: Secondary | ICD-10-CM | POA: Diagnosis not present

## 2020-08-07 DIAGNOSIS — Z96643 Presence of artificial hip joint, bilateral: Secondary | ICD-10-CM | POA: Insufficient documentation

## 2020-08-07 HISTORY — PX: TENDON REPAIR: SHX5111

## 2020-08-07 LAB — CULTURE, BLOOD (ROUTINE X 2)
Culture: NO GROWTH
Culture: NO GROWTH

## 2020-08-07 SURGERY — TENDON REPAIR
Anesthesia: General | Site: Hand | Laterality: Right

## 2020-08-07 MED ORDER — ROPIVACAINE HCL 7.5 MG/ML IJ SOLN
INTRAMUSCULAR | Status: DC | PRN
  Administered 2020-08-07: 25 mL via PERINEURAL

## 2020-08-07 MED ORDER — ACETAMINOPHEN 325 MG PO TABS
325.0000 mg | ORAL_TABLET | ORAL | Status: DC | PRN
Start: 2020-08-07 — End: 2020-08-07

## 2020-08-07 MED ORDER — OXYCODONE HCL 5 MG PO TABS
5.0000 mg | ORAL_TABLET | Freq: Once | ORAL | Status: DC | PRN
Start: 2020-08-07 — End: 2020-08-07

## 2020-08-07 MED ORDER — LACTATED RINGERS IV SOLN: INTRAVENOUS | Status: DC

## 2020-08-07 MED ORDER — EPHEDRINE SULFATE 50 MG/ML IJ SOLN
INTRAMUSCULAR | Status: DC | PRN
  Administered 2020-08-07 (×2): 5 mg via INTRAVENOUS

## 2020-08-07 MED ORDER — ONDANSETRON HCL 4 MG/2ML IJ SOLN: 4.0000 mg | Freq: Once | INTRAMUSCULAR | Status: DC | PRN

## 2020-08-07 MED ORDER — PROPOFOL 10 MG/ML IV BOLUS
INTRAVENOUS | Status: DC | PRN
  Administered 2020-08-07: 150 mg via INTRAVENOUS

## 2020-08-07 MED ORDER — CEFAZOLIN SODIUM-DEXTROSE 2-4 GM/100ML-% IV SOLN
2.0000 g | INTRAVENOUS | Status: AC
  Administered 2020-08-07: 2 g via INTRAVENOUS

## 2020-08-07 MED ORDER — CEFAZOLIN SODIUM-DEXTROSE 1-4 GM/50ML-% IV SOLN
1.0000 g | Freq: Once | INTRAVENOUS | Status: AC
  Administered 2020-08-07: 1 g via INTRAVENOUS

## 2020-08-07 MED ORDER — FENTANYL CITRATE (PF) 100 MCG/2ML IJ SOLN
INTRAMUSCULAR | Status: AC
  Filled 2020-08-07: qty 2

## 2020-08-07 MED ORDER — ONDANSETRON HCL 4 MG/2ML IJ SOLN
INTRAMUSCULAR | Status: DC | PRN
  Administered 2020-08-07: 4 mg via INTRAVENOUS

## 2020-08-07 MED ORDER — DEXAMETHASONE SODIUM PHOSPHATE 10 MG/ML IJ SOLN
INTRAMUSCULAR | Status: DC | PRN
  Administered 2020-08-07: 10 mg

## 2020-08-07 MED ORDER — FENTANYL CITRATE (PF) 100 MCG/2ML IJ SOLN
100.0000 ug | Freq: Once | INTRAMUSCULAR | Status: AC
  Administered 2020-08-07: 100 ug via INTRAVENOUS

## 2020-08-07 MED ORDER — MEPERIDINE HCL 25 MG/ML IJ SOLN: 6.2500 mg | INTRAMUSCULAR | Status: DC | PRN

## 2020-08-07 MED ORDER — CEFAZOLIN SODIUM-DEXTROSE 1-4 GM/50ML-% IV SOLN
INTRAVENOUS | Status: AC
  Filled 2020-08-07: qty 50

## 2020-08-07 MED ORDER — LIDOCAINE HCL (PF) 1 % IJ SOLN
INTRAMUSCULAR | Status: AC
  Filled 2020-08-07: qty 30

## 2020-08-07 MED ORDER — ACETAMINOPHEN 160 MG/5ML PO SOLN: 325.0000 mg | ORAL | Status: DC | PRN

## 2020-08-07 MED ORDER — MIDAZOLAM HCL 2 MG/2ML IJ SOLN
INTRAMUSCULAR | Status: AC
  Filled 2020-08-07: qty 2

## 2020-08-07 MED ORDER — OXYCODONE-ACETAMINOPHEN 5-325 MG PO TABS: 1.0000 | ORAL_TABLET | Freq: Four times a day (QID) | ORAL | 0 refills | Status: DC | PRN

## 2020-08-07 MED ORDER — FENTANYL CITRATE (PF) 100 MCG/2ML IJ SOLN: 25.0000 ug | INTRAMUSCULAR | Status: DC | PRN

## 2020-08-07 MED ORDER — OXYCODONE HCL 5 MG/5ML PO SOLN: 5.0000 mg | Freq: Once | ORAL | Status: DC | PRN

## 2020-08-07 MED ORDER — BUPIVACAINE HCL (PF) 0.25 % IJ SOLN
INTRAMUSCULAR | Status: AC
  Filled 2020-08-07: qty 30

## 2020-08-07 MED ORDER — CEFAZOLIN SODIUM-DEXTROSE 2-4 GM/100ML-% IV SOLN
INTRAVENOUS | Status: AC
  Filled 2020-08-07: qty 100

## 2020-08-07 MED ORDER — DEXAMETHASONE SODIUM PHOSPHATE 10 MG/ML IJ SOLN
INTRAMUSCULAR | Status: DC | PRN
  Administered 2020-08-07: 4 mg via INTRAVENOUS

## 2020-08-07 MED ORDER — MIDAZOLAM HCL 2 MG/2ML IJ SOLN
2.0000 mg | Freq: Once | INTRAMUSCULAR | Status: AC
  Administered 2020-08-07: 2 mg via INTRAVENOUS

## 2020-08-07 MED ORDER — LIDOCAINE 2% (20 MG/ML) 5 ML SYRINGE
INTRAMUSCULAR | Status: DC | PRN
  Administered 2020-08-07: 60 mg via INTRAVENOUS

## 2020-08-07 MED ORDER — BUPIVACAINE HCL (PF) 0.5 % IJ SOLN
INTRAMUSCULAR | Status: AC
  Filled 2020-08-07: qty 30

## 2020-08-07 SURGICAL SUPPLY — 59 items
APL SKNCLS STERI-STRIP NONHPOA (GAUZE/BANDAGES/DRESSINGS) ×1
BENZOIN TINCTURE PRP APPL 2/3 (GAUZE/BANDAGES/DRESSINGS) ×2 IMPLANT
BIT DRILL 4.8MMDIAX5IN DISPOSE (BIT) IMPLANT
BIT DRILL JACOB END 9/64INX5IN (BIT) ×1 IMPLANT
BLADE MINI RND TIP GREEN BEAV (BLADE) ×2 IMPLANT
BLADE SURG 15 STRL LF DISP TIS (BLADE) ×1 IMPLANT
BLADE SURG 15 STRL SS (BLADE) ×2
BNDG CMPR 9X4 STRL LF SNTH (GAUZE/BANDAGES/DRESSINGS) ×1
BNDG ELASTIC 3X5.8 VLCR STR LF (GAUZE/BANDAGES/DRESSINGS) ×4 IMPLANT
BNDG ESMARK 4X9 LF (GAUZE/BANDAGES/DRESSINGS) ×2 IMPLANT
CANISTER SUCT 1200ML W/VALVE (MISCELLANEOUS) IMPLANT
COVER BACK TABLE 60X90IN (DRAPES) ×2 IMPLANT
COVER MAYO STAND STRL (DRAPES) ×2 IMPLANT
CUFF TOURN SGL QUICK 18X4 (TOURNIQUET CUFF) ×1 IMPLANT
DRAIN PENROSE 1/4X12 LTX STRL (WOUND CARE) IMPLANT
DRAPE EXTREMITY T 121X128X90 (DISPOSABLE) ×2 IMPLANT
DRAPE OEC MINIVIEW 54X84 (DRAPES) ×2 IMPLANT
DRAPE SURG 17X23 STRL (DRAPES) ×2 IMPLANT
DRILL BIT 4.8MMDIAX5IN DISPOSE (BIT) ×2
DURAPREP 26ML APPLICATOR (WOUND CARE) ×2 IMPLANT
ELECT REM PT RETURN 9FT ADLT (ELECTROSURGICAL) ×2
ELECTRODE REM PT RTRN 9FT ADLT (ELECTROSURGICAL) ×1 IMPLANT
GAUZE SPONGE 4X4 12PLY STRL (GAUZE/BANDAGES/DRESSINGS) ×2 IMPLANT
GAUZE XEROFORM 1X8 LF (GAUZE/BANDAGES/DRESSINGS) ×2 IMPLANT
GLOVE BIOGEL M 6.5 STRL (GLOVE) ×1 IMPLANT
GLOVE SRG 8 PF TXTR STRL LF DI (GLOVE) ×2 IMPLANT
GLOVE SURG LTX SZ7.5 (GLOVE) ×4 IMPLANT
GLOVE SURG UNDER POLY LF SZ8 (GLOVE) ×4
GOWN STRL REUS W/ TWL LRG LVL3 (GOWN DISPOSABLE) ×1 IMPLANT
GOWN STRL REUS W/ TWL XL LVL3 (GOWN DISPOSABLE) ×1 IMPLANT
GOWN STRL REUS W/TWL LRG LVL3 (GOWN DISPOSABLE) ×2
GOWN STRL REUS W/TWL XL LVL3 (GOWN DISPOSABLE) ×4 IMPLANT
NDL KEITH (NEEDLE) IMPLANT
NEEDLE KEITH (NEEDLE) ×2 IMPLANT
NS IRRIG 1000ML POUR BTL (IV SOLUTION) ×1 IMPLANT
PACK BASIN DAY SURGERY FS (CUSTOM PROCEDURE TRAY) ×2 IMPLANT
PAD CAST 3X4 CTTN HI CHSV (CAST SUPPLIES) ×2 IMPLANT
PADDING CAST COTTON 3X4 STRL (CAST SUPPLIES) ×4
PASSER SUT SWANSON 36MM LOOP (INSTRUMENTS) IMPLANT
PENCIL SMOKE EVACUATOR (MISCELLANEOUS) ×2 IMPLANT
SHEET MEDIUM DRAPE 40X70 STRL (DRAPES) ×1 IMPLANT
SLING ARM FOAM STRAP LRG (SOFTGOODS) ×1 IMPLANT
SPLINT PLASTER CAST XFAST 3X15 (CAST SUPPLIES) IMPLANT
SPLINT PLASTER XTRA FASTSET 3X (CAST SUPPLIES) ×10
STOCKINETTE 4X48 STRL (DRAPES) ×2 IMPLANT
STRIP CLOSURE SKIN 1/2X4 (GAUZE/BANDAGES/DRESSINGS) ×2 IMPLANT
SUCTION FRAZIER HANDLE 10FR (MISCELLANEOUS)
SUCTION TUBE FRAZIER 10FR DISP (MISCELLANEOUS) IMPLANT
SUT FIBERWIRE 3-0 18 TAPR NDL (SUTURE)
SUT FIBERWIRE 4-0 18 TAPR NDL (SUTURE) ×10
SUT MNCRL AB 4-0 PS2 18 (SUTURE) ×1 IMPLANT
SUT VIC AB 2-0 SH 27 (SUTURE)
SUT VIC AB 2-0 SH 27XBRD (SUTURE) IMPLANT
SUTURE FIBERWR 3-0 18 TAPR NDL (SUTURE) ×1 IMPLANT
SUTURE FIBERWR 4-0 18 TAPR NDL (SUTURE) ×1 IMPLANT
SYR BULB EAR ULCER 3OZ GRN STR (SYRINGE) ×2 IMPLANT
TOWEL GREEN STERILE FF (TOWEL DISPOSABLE) ×2 IMPLANT
TUBE CONNECTING 20X1/4 (TUBING) IMPLANT
UNDERPAD 30X36 HEAVY ABSORB (UNDERPADS AND DIAPERS) ×2 IMPLANT

## 2020-08-07 NOTE — Anesthesia Procedure Notes (Signed)
Procedure Name: LMA Insertion Date/Time: 08/07/2020 1:37 PM Performed by: Maryella Shivers, CRNA Pre-anesthesia Checklist: Patient identified, Emergency Drugs available, Suction available and Patient being monitored Patient Re-evaluated:Patient Re-evaluated prior to induction Oxygen Delivery Method: Circle system utilized Preoxygenation: Pre-oxygenation with 100% oxygen Induction Type: IV induction Ventilation: Mask ventilation without difficulty LMA: LMA inserted LMA Size: 5.0 Number of attempts: 1 Airway Equipment and Method: Bite block Placement Confirmation: positive ETCO2 Tube secured with: Tape Dental Injury: Teeth and Oropharynx as per pre-operative assessment

## 2020-08-07 NOTE — Anesthesia Preprocedure Evaluation (Addendum)
Anesthesia Evaluation  Patient identified by MRN, date of birth, ID band Patient awake    Reviewed: Allergy & Precautions, NPO status , Patient's Chart, lab work & pertinent test results  Airway Mallampati: I  TM Distance: >3 FB Neck ROM: Full    Dental   Pulmonary COPD,    Pulmonary exam normal        Cardiovascular Normal cardiovascular exam+ dysrhythmias Atrial Fibrillation   ECHO 6/19  Left ventricle: The cavity size was normal. Wall thickness was  normal. Systolic function was mildly to moderately reduced. The  estimated ejection fraction was in the range of 40% to 45%.  Diffuse hypokinesis.    Neuro/Psych    GI/Hepatic GERD  Medicated and Controlled,  Endo/Other    Renal/GU      Musculoskeletal   Abdominal   Peds  Hematology   Anesthesia Other Findings   Reproductive/Obstetrics                            Anesthesia Physical  Anesthesia Plan  ASA: III  Anesthesia Plan: General   Post-op Pain Management:    Induction: Intravenous  PONV Risk Score and Plan: 2 and Dexamethasone, Ondansetron and Treatment may vary due to age or medical condition  Airway Management Planned: Oral ETT and LMA  Additional Equipment:   Intra-op Plan:   Post-operative Plan: Extubation in OR  Informed Consent: I have reviewed the patients History and Physical, chart, labs and discussed the procedure including the risks, benefits and alternatives for the proposed anesthesia with the patient or authorized representative who has indicated his/her understanding and acceptance.       Plan Discussed with: CRNA and Surgeon  Anesthesia Plan Comments:         Anesthesia Quick Evaluation

## 2020-08-07 NOTE — H&P (Signed)
A pre op hand p   Chief Complaint: Right hand pain  HPI: Levi [name] is a 75 y.o. male who presents for evaluation of right hand pain. It has been present for greater than 3 months having failed injection therapy, bracing, and activity modification and has been worsening. He has failed conservative measures. Pain is rated as severe.  Past Medical History:  Diagnosis Date  . Agent orange exposure 1970's   takes Imdur,Diltiazem, and Atacand daily  . ALLERGIC RHINITIS   . Allergy    seasonal allergies  . Asthma    uses inhaler and nebulizers  . Bruises easily    d/t prednisone daily  . Cataract   . CHF (congestive heart failure) ([place])    dx-on meds to treat  . Chronic bronchitis ([place])    "used to get it q yr; last time ?2008" (04/28/2012)  . COPD (chronic obstructive pulmonary disease) (HCC)    agent orange exposure  . Degenerative disk disease    "everywhere" (04/28/2012)  . Diverticulitis   . Dysrhythmia    afib  . Elevated uric acid in blood    takes Allopurinol daily  . Emphysema of lung ([place])   . Enlarged prostate    but not on any meds  . GERD (gastroesophageal reflux disease)    takes Protonix daily  . H/O hiatal hernia   . Headache   . History of pneumonia    a. 2010  . Hyperlipidemia    takes Lipitor daily; pt. states he takes a preventive  . Joint pain   . Joint swelling   . Neuromuscular disorder (HCC)    bilat legs, and bilat arms  . PAF (paroxysmal atrial fibrillation) ([place])    a. Dx 04/2012, CHA2DS2VASc = 1 (age);  b. 04/2012 Echo: EF 40-50%, mild MR.  . Personal history of colonic adenomas 02/09/2013  . Shortness of breath dyspnea   . Thyroid disease    no taking meds-caused dizziness   Past Surgical History:  Procedure Laterality Date  . ANTERIOR CERVICAL DECOMP/DISCECTOMY FUSION N/A 07/21/2012   Procedure: ANTERIOR CERVICAL DECOMPRESSION/DISCECTOMY FUSION 2 LEVELS;  Surgeon: [name], MD;  Location: [facility];  Service: Neurosurgery;   Laterality: N/A;  C4-5 C5-6 Anterior cervical decompression/diskectomy/fusion  . ANTERIOR LAT LUMBAR FUSION N/A 04/07/2017   Procedure: Thoracic twelve-Lumbar one Anterolateral decompression/fusion;  Surgeon: [name], MD;  Location: [place];  Service: Neurosurgery;  Laterality: N/A;  . BACK SURGERY     x 3  . CARDIAC CATHETERIZATION  11/2008   [name] - 20% calcified non flow limiting left main, 50% EF apical hypokinesis  . CERVICAL DISC ARTHROPLASTY N/A 04/07/2017   Procedure: Cervical six-seven Disc arthroplasty;  Surgeon: [name], MD;  Location: [place];  Service: Neurosurgery;  Laterality: N/A;  . CHEST TUBE INSERTION Left 04/11/2017   Procedure: CHEST TUBE INSERTION;  Surgeon: [name], MD;  Location: [place];  Service: Thoracic;  Laterality: Left;  . CHOLECYSTECTOMY    . COLONOSCOPY  2016   CG-MAC-miralax(good)servere TICS/TA x 2  . ESOPHAGOGASTRODUODENOSCOPY    . EYE SURGERY Bilateral 2011   steroidal encapsulation" (04/28/2012)  . FOOT SURGERY Right    x 2  . KNEE ARTHROSCOPY Bilateral   . LATERAL / POSTERIOR COMBINED FUSION LUMBAR SPINE  2012  . POLYPECTOMY  2016   severe TICS/TA x 2  . REVERSE SHOULDER ARTHROPLASTY  04/28/2012   Procedure: REVERSE SHOULDER ARTHROPLASTY;  Surgeon: [name], MD;  Location: [facility]  OR;  Service: Orthopedics;  Laterality: Left;  Left shouder reverse total shoulder arthroplasty  . SHOULDER SURGERY    . [place]  . TOTAL HIP ARTHROPLASTY Left 2011   "left" (04/28/2012)  . TOTAL HIP ARTHROPLASTY Right 07/31/2015   Procedure: TOTAL HIP ARTHROPLASTY ANTERIOR APPROACH;  Surgeon: [name], MD;  Location: [place];  Service: Orthopedics;  Laterality: Right;  . TOTAL KNEE ARTHROPLASTY  01/10/2012   Procedure: TOTAL KNEE ARTHROPLASTY;  Surgeon: [name], MD;  Location: [place];  Service: Orthopedics;;  left total knee arthroplasty  . TOTAL KNEE ARTHROPLASTY Right 06/17/2014   Procedure: TOTAL KNEE  ARTHROPLASTY;  Surgeon: [name], MD;  Location: [place];  Service: Orthopedics;  Laterality: Right;   Social History   Socioeconomic History  . Marital status: Married    Spouse name: Not on file  . Number of children: 1  . Years of education: Not on file  . Highest education level: Not on file  Occupational History  . 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 Smoking status: Never Smoker  . Smokeless tobacco: Never Used  Vaping Use  . Vaping Use: Never used  Substance and Sexual Activity  . Alcohol use: No    Alcohol/week: 0.0 standard drinks  . Drug use: No  . Sexual activity: Not on file  Other Topics Concern  . 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 Heart disease Father        Died 74, MI  . Diabetes Mother   . Prostate cancer Brother   . Colon polyps Brother   . Colon polyps Brother   . Colon cancer Other   . Esophageal cancer Neg Hx   . Rectal cancer Neg Hx   . Stomach cancer Neg Hx   . Pancreatic cancer Neg Hx   . Kidney disease Neg Hx   . Liver disease Neg Hx    Allergies  Allergen Reactions  . Morphine And Related Palpitations and Other (See Comments)    Made patient light-headed and sweaty; "my heart races" (04/28/2012), pt states too much morphine gave him this reaction  . Levaquin [Levofloxacin] Itching  . Fluticasone-Salmeterol Other (See Comments)    Patient somewhat disputes this (??)   Prior to Admission medications    Medication Sig Start Date End Date Taking? Authorizing Provider  albuterol (VENTOLIN HFA) 108 (90 Base) MCG/ACT inhaler Inhale 2 puffs into the lungs every 4 (four) hours as needed for wheezing or shortness of breath. 09/21/19  Yes Young, Clinton D, MD  allopurinol (ZYLOPRIM) 100 MG tablet Take 100 mg by mouth daily.   Yes [provider]  aspirin EC 81 MG tablet Take 1 tablet (81 mg total) by mouth daily. 10/10/15  Yes Jerline Pain, MD  atorvastatin (LIPITOR) 20 MG tablet TAKE 1 TABLET BY MOUTH EVERY DAY 05/08/20  Yes Jerline Pain, MD  candesartan (ATACAND) 4 MG tablet TAKE 1/2 TABLETS (2 MG TOTAL) BY MOUTH DAILY. 03/28/20  Yes Jerline Pain, MD  cyclobenzaprine (FLEXERIL) 10 MG tablet Take 10 mg by mouth at bedtime as needed for muscle spasms.   Yes [provider]  diltiazem (CARDIZEM CD) 180 MG 24 hr capsule TAKE 1 CAPSULE BY MOUTH EVERY DAY 09/28/19  Yes [name] C, NP  ipratropium-albuterol (DUONEB) 0.5-2.5 (3) MG/3ML SOLN 1 NEBULE EVERY 6 HOURS AS NEEDED. **J45.3** 03/22/20  Yes Young, Tarri Fuller D, MD  isosorbide mononitrate (IMDUR) 30 MG 24 hr tablet TAKE 1 TABLET (30 MG TOTAL) BY MOUTH DAILY. 02/08/20  Yes Jerline Pain, MD  omeprazole (PRILOSEC) 40 MG capsule Take 1 capsule (40 mg total) by mouth 2 (two) times daily before a meal. 02/11/20  Yes Gatha Mayer, MD  predniSONE (DELTASONE) 10 MG tablet Take 2 tablets (20 mg total) by mouth daily. continuous 04/24/20  Yes Young, Tarri Fuller D, MD  STRIVERDI RESPIMAT 2.5 MCG/ACT AERS INHALE 2 PUFFS BY MOUTH INTO THE LUNGS DAILY 07/03/20  Yes Baird Lyons D, MD  tamsulosin (FLOMAX) 0.4 MG CAPS capsule Take 0.4 mg by mouth at bedtime.  10/13/15  Yes [provider]  ciprofloxacin (CIPRO) 500 MG tablet Take 1 tablet (500 mg total) by mouth 2 (two) times daily. 08/02/20   Hayden Rasmussen, MD  diltiazem (CARDIZEM) 30 MG tablet 1 tablet    [provider]  furosemide (LASIX) 20 MG tablet Take 1 tablet (20 mg total)  by mouth daily as needed. For shortness of breath and lower limb edema 11/17/18   Jerline Pain, MD  Nebulizers (COMPRESSOR/NEBULIZER) MISC Use as directed 12/01/17   Deneise Lever, MD     Positive ROS: None  All other systems have been reviewed and were otherwise negative with the exception of those mentioned in the HPI and as above.  Physical Exam: There were no vitals filed for this visit.  General: Alert, no acute distress Cardiovascular: No pedal edema Respiratory: No cyanosis, no use of accessory musculature GI: No organomegaly, abdomen is soft and non-tender Skin: No lesions in the area of chief complaint Neurologic: Sensation intact distally Psychiatric: Patient is competent for consent with normal mood and affect Lymphatic: No axillary or cervical lymphadenopathy  MUSCULOSKELETAL: Right hand has positive grind.  There is obvious malalignment of his CMC joint.  There is tenderness palpation over the first [facility] joint.  X-ray: X-rays show severe CMC arthritis of the first [facility] joint.  Assessment/Plan: CARPOMETACARPAL JOINT OSTEOARTHRITIS OF RIGHT HAND Plan for Procedure(s): LIGAMENT RECONSTRUCTION AND TENDON INTERPOSITION  The risks benefits and alternatives were discussed with the patient including but not limited to the risks of nonoperative treatment, versus surgical intervention including infection, bleeding, nerve injury, malunion, nonunion, hardware prominence, hardware failure, need for hardware removal, blood clots, cardiopulmonary complications, morbidity, mortality, among others, and they were willing to proceed.  Predicted outcome is good, although there will be at least a six to nine month expected recovery.  [name], MD 08/07/2020 12:11 PM

## 2020-08-07 NOTE — Anesthesia Procedure Notes (Signed)
Anesthesia Regional Block: Axillary brachial plexus block   Pre-Anesthetic Checklist: ,, timeout performed, Correct Patient, Correct Site, Correct Laterality, Correct Procedure, Correct Position, site marked, Risks and benefits discussed,  Surgical consent,  Pre-op evaluation,  At surgeon's request and post-op pain management  Laterality: Right  Prep: chloraprep       Needles:  Injection technique: Single-shot  Needle Type: Echogenic Stimulator Needle     Needle Length: 5cm  Needle Gauge: 22     Additional Needles:   Procedures:, nerve stimulator,,, ultrasound used (permanent image in chart),,,,   Nerve Stimulator or Paresthesia:  Response: hand, 0.45 mA,   Additional Responses:   Narrative:  Start time: 08/07/2020 12:58 PM End time: 08/07/2020 1:02 PM Injection made incrementally with aspirations every 5 mL.  Performed by: Personally  Anesthesiologist: Janeece Riggers, MD  Additional Notes: Functioning IV was confirmed and monitors were applied.  A 63mm 22ga Arrow echogenic stimulator needle was used. Sterile prep and drape,hand hygiene and sterile gloves were used. Ultrasound guidance: relevant anatomy identified, needle position confirmed, local anesthetic spread visualized around nerve(s)., vascular puncture avoided.  Image printed for medical record. Negative aspiration and negative test dose prior to incremental administration of local anesthetic. The patient tolerated the procedure well.

## 2020-08-07 NOTE — Transfer of Care (Signed)
Immediate Anesthesia Transfer of Care Note  Patient: Levi [name]  Procedure(s) Performed: RIGHT HAND LIGAMENT RECONSTRUCTION AND TENDON INTERPOSITION (Right Hand)  Patient Location: PACU  Anesthesia Type:GA combined with regional for post-op pain  Level of Consciousness: sedated  Airway & Oxygen Therapy: Patient Spontanous Breathing and Patient connected to face mask oxygen  Post-op Assessment: Report given to RN and Post -op Vital signs reviewed and stable  Post vital signs: Reviewed and stable  Last Vitals:  Vitals Value Taken Time  BP 131/84 08/07/20 1515  Temp    Pulse 86 08/07/20 1517  Resp 14 08/07/20 1517  SpO2 98 % 08/07/20 1517  Vitals shown include unvalidated device data.  Last Pain:  Vitals:   08/07/20 1234  TempSrc: Oral  PainSc: 6       Patients Stated Pain Goal: 3 (85/99/23 4144)  Complications: No complications documented.

## 2020-08-07 NOTE — Op Note (Signed)
NAME: Levi [name], Levi [name] MEDICAL RECORD NO: [id number] ACCOUNT NO: [id number] DATE OF BIRTH: January 11, 1946 FACILITY: [facility] LOCATION: [facility] PHYSICIAN: [name], MD  Operative Report   DATE OF PROCEDURE: 08/07/2020  PREOPERATIVE DIAGNOSIS:  Carpometacarpal arthritis, right.  POSTOPERATIVE DIAGNOSIS:  Carpometacarpal arthritis, right.  PROCEDURES:  1.  Excision of trapezium. 2.  Interpositional arthroplasty, right hand. 3.  Interpretation of multiple intraoperative fluoroscopic images.  SURGEON:  [name], MD  ASSISTANT:  Gaspar Skeeters, PA-C, who was present for the entire case and assisted by manipulation of the hand, holding bones in place while we were repairing the tendon and closing to minimize OR time.  BRIEF HISTORY:  The patient is a 75 year old male with a long history of severe levels of osteoarthritis of multiple places in his body.  He had severe CMC arthritis.  We have tried injections and activity modification and bracing, all that failed.  He  had x-rays showing severe CMC arthritis, and because of failure of all conservative care, he was taken to the operating room for [facility] arthroplasty.  DESCRIPTION OF PROCEDURE:  The patient was taken to the operating room.  After adequate anesthesia was obtained with a general anesthetic, the patient was placed supine on the operating table.  The right arm was prepped and draped in the usual sterile  fashion.  Following this, the arm was exsanguinated, blood pressure tourniquet inflated to 250 mmHg.  Following this, attention was turned towards an incision over the radial aspect of the first [facility] joint.  Subcutaneous dissection down to the level of  the capsule.  Capsule was identified, opened and tagged.  The trapezium was identified under fluoroscopic guidance, and once we were certain of that, we excised the trapezium piecemeal.  Attention was then turned towards harvesting the FCR tendon.  The  FCR tendon was identified midway  up the forearm.  A pigtail tendon stripper was used, and we released the tendon all the way up to the elbow.  Once that was done, we brought it into the wound and then we brought it into the primary wound with a curved  hemostat.  Once that was done, 2 stitches were put in the base of the wound and to be used later.  At this point, a drill hole was made perpendicular to the thumb nail from dorsal to medial and to the base of the second metatarsal.  This was overreamed  with a 3/8th inch drill bit.  The tendon was then passed into the wound, and once that was done, it was folded back over on itself and tied in place essentially resurfacing the base of the first metacarpal.  The tendon anchovy was then passed back and  forth over a pursestring suture that was in the base of the wound.  This was then pulled into place and held into place, and once that was done, we used a second suture that was there to tie over top of the anchovy to hold it in a good place.  Excellent  capsular repair was able to be achieved over it giving additional ability to kind of hold that in the appropriate place.  Once that was done, attention was turned towards attaching the capsule to the tendon that had been brought over the top of the first  metacarpal and we locked that in place as well.  Once this was done, the wounds were irrigated, suctioned dry, closed with nylon interrupted sutures.  Sterile compressive dressing was applied as well as  a thumb spica splint, and the patient was taken to  recovery, was noted to be in satisfactory condition.  Estimated blood loss for procedure was minimal.  Total tourniquet time can be gotten from the anesthetic record.   ROH D: 08/07/2020 3:06:28 pm T: 08/07/2020 4:50:00 pm  JOB: [id number]/ [id number]

## 2020-08-07 NOTE — Brief Op Note (Signed)
08/07/2020  3:01 PM  PATIENT:  Levi [name]  75 y.o. male  PRE-OPERATIVE DIAGNOSIS:  CARPOMETACARPAL JOINT OSTEOARTHRITIS OF RIGHT HAND  POST-OPERATIVE DIAGNOSIS:  CARPOMETACARPAL JOINT OSTEOARTHRITIS OF RIGHT HAND  PROCEDURE:  Procedure(s): RIGHT HAND LIGAMENT RECONSTRUCTION AND TENDON INTERPOSITION (Right)  SURGEON:  Surgeon(s) and Role:    [name], MD - Primary  PHYSICIAN ASSISTANT:   ASSISTANTS: jim bethune   ANESTHESIA:   general  EBL:  mni   BLOOD ADMINISTERED:none  DRAINS: none   LOCAL MEDICATIONS USED:  MARCAINE     SPECIMEN:  No Specimen  DISPOSITION OF SPECIMEN:  N/A  COUNTS:  YES  TOURNIQUET:  * Missing tourniquet times found for documented tourniquets in log: [id number] *  DICTATION: .Other Dictation: Dictation Number [id number]  PLAN OF CARE: Discharge to home after PACU  PATIENT DISPOSITION:  PACU - hemodynamically stable.   Delay start of Pharmacological VTE agent (>24hrs) due to surgical blood loss or risk of bleeding: no

## 2020-08-07 NOTE — Discharge Instructions (Signed)
Wear sling for 2 to 3 days.  You may remove it for range of motion of your shoulder and elbow. Elevate your right hand above your heart as much as possible.  Apply ice as needed. You may move your fingers as tolerated. Let us know if your fingers become swollen or significantly numb.   Post Anesthesia Home Care Instructions  Activity: Get plenty of rest for the remainder of the day. A responsible individual must stay with you for 24 hours following the procedure.  For the next 24 hours, DO NOT: -Drive a car -Paediatric nurse -Drink alcoholic beverages -Take any medication unless instructed by your physician -Make any legal decisions or sign important papers.  Meals: Start with liquid foods such as gelatin or soup. Progress to regular foods as tolerated. Avoid greasy, spicy, heavy foods. If nausea and/or vomiting occur, drink only clear liquids until the nausea and/or vomiting subsides. Call your physician if vomiting continues.  Special Instructions/Symptoms: Your throat may feel dry or sore from the anesthesia or the breathing tube placed in your throat during surgery. If this causes discomfort, gargle with warm salt water. The discomfort should disappear within 24 hours.  If you had a scopolamine patch placed behind your ear for the management of post- operative nausea and/or vomiting:  1. The medication in the patch is effective for 72 hours, after which it should be removed.  Wrap patch in a tissue and discard in the trash. Wash hands thoroughly with soap and water. 2. You may remove the patch earlier than 72 hours if you experience unpleasant side effects which may include dry mouth, dizziness or visual disturbances. 3. Avoid touching the patch. Wash your hands with soap and water after contact with the patch.    Regional Anesthesia Blocks  1. Numbness or the inability to move the "blocked" extremity may last from 3-48 hours after placement. The length of time depends on the  medication injected and your individual response to the medication. If the numbness is not going away after 48 hours, call your surgeon.  2. The extremity that is blocked will need to be protected until the numbness is gone and the  Strength has returned. Because you cannot feel it, you will need to take extra care to avoid injury. Because it may be weak, you may have difficulty moving it or using it. You may not know what position it is in without looking at it while the block is in effect.  3. For blocks in the legs and feet, returning to weight bearing and walking needs to be done carefully. You will need to wait until the numbness is entirely gone and the strength has returned. You should be able to move your leg and foot normally before you try and bear weight or walk. You will need someone to be with you when you first try to ensure you do not fall and possibly risk injury.  4. Bruising and tenderness at the needle site are common side effects and will resolve in a few days.  5. Persistent numbness or new problems with movement should be communicated to the surgeon or the [facility] [phone] [place] [phone]).

## 2020-08-07 NOTE — Progress Notes (Signed)
Assisted Dr. Oddono with right, ultrasound guided, axillary block. Side rails up, monitors on throughout procedure. See vital signs in flow sheet. Tolerated Procedure well. 

## 2020-08-08 ENCOUNTER — Encounter (HOSPITAL_BASED_OUTPATIENT_CLINIC_OR_DEPARTMENT_OTHER): Payer: Self-pay | Admitting: Orthopedic Surgery

## 2020-08-08 NOTE — Anesthesia Postprocedure Evaluation (Signed)
Anesthesia Post Note  Patient: Levi [name]  Procedure(s) Performed: RIGHT HAND LIGAMENT RECONSTRUCTION AND TENDON INTERPOSITION (Right Hand)     Patient location during evaluation: PACU Anesthesia Type: General Level of consciousness: awake and alert Pain management: pain level controlled Vital Signs Assessment: post-procedure vital signs reviewed and stable Respiratory status: spontaneous breathing, nonlabored ventilation, respiratory function stable and patient connected to nasal cannula oxygen Cardiovascular status: blood pressure returned to baseline and stable Postop Assessment: no apparent nausea or vomiting Anesthetic complications: no   No complications documented.  Last Vitals:  Vitals:   08/07/20 1537 08/07/20 1554  BP: 132/72 133/82  Pulse: 88 90  Resp: 17 16  Temp:  36.4 C  SpO2: 95% 96%    Last Pain:  Vitals:   08/07/20 1554  TempSrc:   PainSc: 0-No pain                 Ivanna Kocak

## 2020-08-09 DIAGNOSIS — L03116 Cellulitis of left lower limb: Secondary | ICD-10-CM | POA: Diagnosis not present

## 2020-08-16 DIAGNOSIS — L03116 Cellulitis of left lower limb: Secondary | ICD-10-CM | POA: Diagnosis not present

## 2020-08-25 ENCOUNTER — Other Ambulatory Visit: Payer: Self-pay | Admitting: *Deleted

## 2020-08-25 MED ORDER — DILTIAZEM HCL ER COATED BEADS 180 MG PO CP24: ORAL_CAPSULE | ORAL | 1 refills | Status: DC

## 2020-08-30 DIAGNOSIS — L03116 Cellulitis of left lower limb: Secondary | ICD-10-CM | POA: Diagnosis not present

## 2020-08-30 DIAGNOSIS — Z23 Encounter for immunization: Secondary | ICD-10-CM | POA: Diagnosis not present

## 2020-09-11 DIAGNOSIS — M79641 Pain in right hand: Secondary | ICD-10-CM | POA: Diagnosis not present

## 2020-09-11 DIAGNOSIS — Z4789 Encounter for other orthopedic aftercare: Secondary | ICD-10-CM | POA: Diagnosis not present

## 2020-09-13 ENCOUNTER — Emergency Department (HOSPITAL_COMMUNITY): Payer: Medicare Other

## 2020-09-13 ENCOUNTER — Emergency Department (HOSPITAL_COMMUNITY)
Admission: EM | Admit: 2020-09-13 | Discharge: 2020-09-13 | Disposition: A | Discharge status: Home / Self Care | Payer: Medicare Other | Attending: Emergency Medicine | Admitting: Emergency Medicine

## 2020-09-13 ENCOUNTER — Encounter (HOSPITAL_COMMUNITY): Payer: Self-pay | Admitting: Emergency Medicine

## 2020-09-13 ENCOUNTER — Other Ambulatory Visit: Payer: Self-pay

## 2020-09-13 DIAGNOSIS — Z96643 Presence of artificial hip joint, bilateral: Secondary | ICD-10-CM | POA: Diagnosis not present

## 2020-09-13 DIAGNOSIS — J4540 Moderate persistent asthma, uncomplicated: Secondary | ICD-10-CM | POA: Insufficient documentation

## 2020-09-13 DIAGNOSIS — I509 Heart failure, unspecified: Secondary | ICD-10-CM | POA: Diagnosis not present

## 2020-09-13 DIAGNOSIS — Z96612 Presence of left artificial shoulder joint: Secondary | ICD-10-CM | POA: Insufficient documentation

## 2020-09-13 DIAGNOSIS — R42 Dizziness and giddiness: Secondary | ICD-10-CM | POA: Diagnosis not present

## 2020-09-13 DIAGNOSIS — R519 Headache, unspecified: Secondary | ICD-10-CM | POA: Diagnosis not present

## 2020-09-13 DIAGNOSIS — J449 Chronic obstructive pulmonary disease, unspecified: Secondary | ICD-10-CM | POA: Insufficient documentation

## 2020-09-13 DIAGNOSIS — Z7982 Long term (current) use of aspirin: Secondary | ICD-10-CM | POA: Diagnosis not present

## 2020-09-13 DIAGNOSIS — Z96653 Presence of artificial knee joint, bilateral: Secondary | ICD-10-CM | POA: Diagnosis not present

## 2020-09-13 LAB — CBC WITH DIFFERENTIAL/PLATELET
Abs Immature Granulocytes: 0.26 10*3/uL — ABNORMAL HIGH (ref 0.00–0.07)
Basophils Absolute: 0.1 10*3/uL (ref 0.0–0.1)
Basophils Relative: 1 %
Eosinophils Absolute: 0 10*3/uL (ref 0.0–0.5)
Eosinophils Relative: 0 %
HCT: 41.1 % (ref 39.0–52.0)
Hemoglobin: 13.5 g/dL (ref 13.0–17.0)
Immature Granulocytes: 3 %
Lymphocytes Relative: 14 %
Lymphs Abs: 1.2 10*3/uL (ref 0.7–4.0)
MCH: 32.4 pg (ref 26.0–34.0)
MCHC: 32.8 g/dL (ref 30.0–36.0)
MCV: 98.6 fL (ref 80.0–100.0)
Monocytes Absolute: 0.7 10*3/uL (ref 0.1–1.0)
Monocytes Relative: 8 %
Neutro Abs: 6.4 10*3/uL (ref 1.7–7.7)
Neutrophils Relative %: 74 %
Platelets: 182 10*3/uL (ref 150–400)
RBC: 4.17 MIL/uL — ABNORMAL LOW (ref 4.22–5.81)
RDW: 14.1 % (ref 11.5–15.5)
WBC: 8.7 10*3/uL (ref 4.0–10.5)
nRBC: 0 % (ref 0.0–0.2)

## 2020-09-13 LAB — BASIC METABOLIC PANEL
Anion gap: 8 (ref 5–15)
BUN: 22 mg/dL (ref 8–23)
CO2: 26 mmol/L (ref 22–32)
Calcium: 9.3 mg/dL (ref 8.9–10.3)
Chloride: 102 mmol/L (ref 98–111)
Creatinine, Ser: 0.92 mg/dL (ref 0.61–1.24)
GFR, Estimated: >60 mL/min (ref >60)
Glucose, Bld: 113 mg/dL — ABNORMAL HIGH (ref 70–99)
Potassium: 4 mmol/L (ref 3.5–5.1)
Sodium: 136 mmol/L (ref 135–145)

## 2020-09-13 MED ORDER — SODIUM CHLORIDE 0.9 % IV BOLUS: 1000.0000 mL | Freq: Once | INTRAVENOUS | Status: DC

## 2020-09-13 NOTE — ED Triage Notes (Signed)
Pt here from home with c/o dizzy after he got out of the shower , along with a H/a and nausea

## 2020-09-13 NOTE — Discharge Instructions (Addendum)
Your CT head is unremarkable today and your labs are normal.  Please stay hydrated.  See your doctor for follow-up  Return to ER if you have worse dizziness, headaches, vomiting, weakness, numbness

## 2020-09-13 NOTE — ED Triage Notes (Signed)
Emergency Medicine Provider Triage Evaluation Note  Levi [name] , a 75 y.o. male  was evaluated in triage.  Pt complains of dizziness and lightheadedness.  Patient reports this afternoon he got out of the shower and started to feel very lightheaded like he was going to pass out and noted that he was having some double vision and everything looked slanted.  He did not pass out or fall to the ground.  Did report an irregular heartbeat at this time and has known history of A. fib.  No associated chest pain or shortness of breath.  Reports he had a mild headache.  Reports he laid down for a few minutes and symptoms seem to be better now he still feels just slightly lightheaded, but reports vision returned to normal.  No weakness or numbness.  Review of Systems  Positive: Dizziness, lightheadedness, vision change Negative: Chest pain, numbness, weakness  Physical Exam  BP 138/82   Pulse 90   Resp 17   SpO2 100%  Gen:   Awake, no distress   HEENT:  Atraumatic  Resp:  Normal effort  Cardiac:  Normal rate  Abd:   Nondistended, nontender MSK:   Moves extremities without difficulty Neuro:  Speech clear, no facial droop, no change in vision or nystagmus, 5/5 strength in bilateral upper and lower extremities with sensation intact  Medical Decision Making  Medically screening exam initiated at 5:28 PM.  Appropriate orders placed.  Levi [name] was informed that the remainder of the evaluation will be completed by another provider, this initial triage assessment does not replace that evaluation, and the importance of remaining in the ED until their evaluation is complete.  Clinical Impression  1.  Lightheadedness   [name], [place] 09/13/20 1730

## 2020-09-13 NOTE — ED Notes (Signed)
Patient transported to CT 

## 2020-09-13 NOTE — ED Provider Notes (Signed)
[name] Provider Note   CSN: [id number] Arrival date & time: 09/13/20  1651     History No chief complaint on file.   Levi [name] is a 75 y.o. male hx of COPD, CHF, afib here presenting with dizziness.  Patient states that he got a shower and felt very lightheaded and dizzy.  He initially had some double vision that resolved.  Patient has some mild headaches.  Patient denies any trouble speaking or weakness or numbness.  Denies any history of stroke in the past.  The history is provided by the patient.       Past Medical History:  Diagnosis Date  . Agent orange exposure 1970's   takes Imdur,Diltiazem, and Atacand daily  . ALLERGIC RHINITIS   . Allergy    seasonal allergies  . Asthma    uses inhaler and nebulizers  . Bruises easily    d/t prednisone daily  . Cataract   . CHF (congestive heart failure) ([place])    dx-on meds to treat  . Chronic bronchitis ([place])    "used to get it q yr; last time ?2008" (04/28/2012)  . COPD (chronic obstructive pulmonary disease) (H[facility])    agent orange exposure  . Degenerative disk disease    "everywhere" (04/28/2012)  . Diverticulitis   . Dysrhythmia    afib  . Elevated uric acid in blood    takes Allopurinol daily  . Emphysema of lung ([place])   . Enlarged prostate    but not on any meds  . GERD (gastroesophageal reflux disease)    takes Protonix daily  . H/O hiatal hernia   . Headache   . History of pneumonia    a. 2010  . Hyperlipidemia    takes Lipitor daily; pt. states he takes a preventive  . Joint pain   . Joint swelling   . Neuromuscular disorder (HCC)    bilat legs, and bilat arms  . PAF (paroxysmal atrial fibrillation) ([place])    a. Dx 04/2012, CHA2DS2VASc = 1 (age);  b. 04/2012 Echo: EF 40-50%, mild MR.  . Personal history of colonic adenomas 02/09/2013  . Shortness of breath dyspnea   . Thyroid disease    no taking meds-caused dizziness    Patient Active Problem List    Diagnosis Date Noted  . Arthritis of carpometacarpal [facility]) joint of right thumb 08/07/2020  . Cellulitis 03/22/2020  . COPD (chronic obstructive pulmonary disease) ([place]) 03/21/2020  . Chest wall pain 09/21/2019  . GERD (gastroesophageal reflux disease) 12/03/2017  . Pneumothorax, left 04/11/2017  . Lumbar stenosis with neurogenic claudication 04/07/2017  . Primary osteoarthritis of right hip 07/31/2015  . Primary osteoarthritis of right knee 06/17/2014  . Thrush of mouth and esophagus ([place]) 04/03/2014  . Palpitation 03/28/2014  . Pseudoarthrosis of lumbar spine 06/15/2013  . Hyperlipidemia   . History of colonic polyps 02/09/2013  . Atrial fibrillation ([place]) 05/11/2012  . Osteoarthritis of left knee 01/10/2012  . Seasonal and perennial allergic rhinitis 07/23/2010  . Asthma, moderate persistent 03/06/2009    Past Surgical History:  Procedure Laterality Date  . ANTERIOR CERVICAL DECOMP/DISCECTOMY FUSION N/A 07/21/2012   Procedure: ANTERIOR CERVICAL DECOMPRESSION/DISCECTOMY FUSION 2 LEVELS;  Surgeon: [name], MD;  Location: [place] [facility];  Service: Neurosurgery;  Laterality: N/A;  C4-5 C5-6 Anterior cervical decompression/diskectomy/fusion  . ANTERIOR LAT LUMBAR FUSION N/A 04/07/2017   Procedure: Thoracic twelve-Lumbar one Anterolateral decompression/fusion;  Surgeon: [name], MD;  Location: [place];  Service: Neurosurgery;  Laterality: N/A;  . BACK SURGERY     x 3  . CARDIAC CATHETERIZATION  11/2008   [name] - 20% calcified non flow limiting left main, 50% EF apical hypokinesis  . CERVICAL DISC ARTHROPLASTY N/A 04/07/2017   Procedure: Cervical six-seven Disc arthroplasty;  Surgeon: [name], MD;  Location: [place];  Service: Neurosurgery;  Laterality: N/A;  . CHEST TUBE INSERTION Left 04/11/2017   Procedure: CHEST TUBE INSERTION;  Surgeon: [name], MD;  Location: [place];  Service: Thoracic;  Laterality: Left;  . CHOLECYSTECTOMY    . COLONOSCOPY  2016    CG-MAC-miralax(good)servere TICS/TA x 2  . ESOPHAGOGASTRODUODENOSCOPY    . EYE SURGERY Bilateral 2011   steroidal encapsulation" (04/28/2012)  . FOOT SURGERY Right    x 2  . KNEE ARTHROSCOPY Bilateral   . LATERAL / POSTERIOR COMBINED FUSION LUMBAR SPINE  2012  . POLYPECTOMY  2016   severe TICS/TA x 2  . REVERSE SHOULDER ARTHROPLASTY  04/28/2012   Procedure: REVERSE SHOULDER ARTHROPLASTY;  Surgeon: [name], MD;  Location: [place];  Service: Orthopedics;  Laterality: Left;  Left shouder reverse total shoulder arthroplasty  . SHOULDER SURGERY    . TENDON REPAIR Right 08/07/2020   Procedure: RIGHT HAND LIGAMENT RECONSTRUCTION AND TENDON INTERPOSITION;  Surgeon: [name], MD;  Location: [place];  Service: Orthopedics;  Laterality: Right;  . [place]  . TOTAL HIP ARTHROPLASTY Left 2011   "left" (04/28/2012)  . TOTAL HIP ARTHROPLASTY Right 07/31/2015   Procedure: TOTAL HIP ARTHROPLASTY ANTERIOR APPROACH;  Surgeon: [name], MD;  Location: [place];  Service: Orthopedics;  Laterality: Right;  . TOTAL KNEE ARTHROPLASTY  01/10/2012   Procedure: TOTAL KNEE ARTHROPLASTY;  Surgeon: [name], MD;  Location: [place];  Service: Orthopedics;;  left total knee arthroplasty  . TOTAL KNEE ARTHROPLASTY Right 06/17/2014   Procedure: TOTAL KNEE ARTHROPLASTY;  Surgeon: [name], MD;  Location: [place];  Service: Orthopedics;  Laterality: Right;       Family History  Problem Relation Age of Onset  . Heart disease Father        Died 74, MI  . Diabetes Mother   . Prostate cancer Brother   . Colon polyps Brother   . Colon polyps Brother   . Colon cancer Other   . Esophageal cancer Neg Hx   . Rectal cancer Neg Hx   . Stomach cancer Neg Hx   . Pancreatic cancer Neg Hx   . Kidney disease Neg Hx   . Liver disease Neg Hx     Social History   Tobacco Use  . Smoking status: Never Smoker  . Smokeless tobacco: Never Used  Vaping Use  .  Vaping Use: Never used  Substance Use Topics  . Alcohol use: No    Alcohol/week: 0.0 standard drinks  . Drug use: No    Home Medications Prior to Admission medications   Medication Sig Start Date End Date Taking? Authorizing Provider  albuterol (VENTOLIN HFA) 108 (90 Base) MCG/ACT inhaler Inhale 2 puffs into the lungs every 4 (four) hours as needed for wheezing or shortness of breath. Patient taking differently: Inhale 2 puffs into the lungs every 4 (four) hours as needed for wheezing or shortness of breath. 09/21/19   Baird Lyons D, MD  allopurinol (ZYLOPRIM) 100 MG tablet Take 100 mg by mouth daily.    [provider]  aspirin EC 81 MG tablet Take 1 tablet (  81 mg total) by mouth daily. 10/10/15   Jerline Pain, MD  atorvastatin (LIPITOR) 20 MG tablet TAKE 1 TABLET BY MOUTH EVERY DAY 05/08/20   Jerline Pain, MD  candesartan (ATACAND) 4 MG tablet TAKE 1/2 TABLETS (2 MG TOTAL) BY MOUTH DAILY. 03/28/20   Jerline Pain, MD  ciprofloxacin (CIPRO) 500 MG tablet Take 1 tablet (500 mg total) by mouth 2 (two) times daily. 08/02/20   Hayden Rasmussen, MD  cyclobenzaprine (FLEXERIL) 10 MG tablet Take 10 mg by mouth at bedtime as needed for muscle spasms.    [provider]  diltiazem (CARDIZEM CD) 180 MG 24 hr capsule TAKE 1 CAPSULE BY MOUTH EVERY DAY 08/25/20   Jerline Pain, MD  diltiazem (CARDIZEM) 30 MG tablet 1 tablet    [provider]  furosemide (LASIX) 20 MG tablet Take 1 tablet (20 mg total) by mouth daily as needed. For shortness of breath and lower limb edema 11/17/18   Jerline Pain, MD  ipratropium-albuterol (DUONEB) 0.5-2.5 (3) MG/3ML SOLN 1 NEBULE EVERY 6 HOURS AS NEEDED. **J45.3** 03/22/20   Young, Tarri Fuller D, MD  isosorbide mononitrate (IMDUR) 30 MG 24 hr tablet TAKE 1 TABLET (30 MG TOTAL) BY MOUTH DAILY. 02/08/20   Jerline Pain, MD  Nebulizers (COMPRESSOR/NEBULIZER) MISC Use as directed 12/01/17   Deneise Lever, MD  omeprazole (PRILOSEC) 40 MG capsule  Take 1 capsule (40 mg total) by mouth 2 (two) times daily before a meal. 02/11/20   Gatha Mayer, MD  oxyCODONE-acetaminophen (PERCOCET/ROXICET) 5-325 MG tablet Take 1-2 tablets by mouth every 6 (six) hours as needed for severe pain. 08/07/20   Gary Fleet, PA-C  predniSONE (DELTASONE) 10 MG tablet Take 2 tablets (20 mg total) by mouth daily. continuous 04/24/20   Baird Lyons D, MD  STRIVERDI RESPIMAT 2.5 MCG/ACT AERS INHALE 2 PUFFS BY MOUTH INTO THE LUNGS DAILY 07/03/20   Baird Lyons D, MD  tamsulosin (FLOMAX) 0.4 MG CAPS capsule Take 0.4 mg by mouth at bedtime.  10/13/15   [provider]    Allergies    Morphine and related, Levaquin [levofloxacin], and Fluticasone-salmeterol  Review of Systems   Review of Systems  Neurological: Positive for dizziness and light-headedness.  All other systems reviewed and are negative.   Physical Exam Updated Vital Signs BP (!) 135/91   Pulse 85   Resp 20   SpO2 100%   Physical Exam Vitals and nursing note reviewed.  Constitutional:      Appearance: Normal appearance.  HENT:     Head: Normocephalic.     Nose: Nose normal.     Mouth/Throat:     Mouth: Mucous membranes are moist.  Eyes:     Extraocular Movements: Extraocular movements intact.     Pupils: Pupils are equal, round, and reactive to light.  Cardiovascular:     Rate and Rhythm: Normal rate and regular rhythm.     Pulses: Normal pulses.     Heart sounds: Normal heart sounds.  Pulmonary:     Effort: Pulmonary effort is normal.     Breath sounds: Normal breath sounds.  Abdominal:     General: Abdomen is flat.     Palpations: Abdomen is soft.  Musculoskeletal:        General: Normal range of motion.     Cervical back: Normal range of motion and neck supple.  Skin:    General: Skin is warm.     Capillary Refill: Capillary refill takes  less than 2 seconds.  Neurological:     General: No focal deficit present.     Mental Status: He is alert and oriented to  person, place, and time.     Comments: CN 2- 12 intact, nl strength throughout, normal finger-to-nose bilaterally.  Normal gait  Psychiatric:        Mood and Affect: Mood normal.     ED Results / Procedures / Treatments   Labs (all labs ordered are listed, but only abnormal results are displayed) Labs Reviewed  BASIC METABOLIC PANEL - Abnormal; Notable for the following components:      Result Value   Glucose, Bld 113 (*)    All other components within normal limits  CBC WITH DIFFERENTIAL/PLATELET - Abnormal; Notable for the following components:   RBC 4.17 (*)    Abs Immature Granulocytes 0.26 (*)    All other components within normal limits    EKG EKG Interpretation  Date/Time:  Wednesday September 13 2020 17:25:02 EDT Ventricular Rate:  87 PR Interval:  140 QRS Duration: 78 QT Interval:  350 QTC Calculation: 421 R Axis:   79 Text Interpretation: Normal sinus rhythm Right atrial enlargement Borderline ECG No significant change since last tracing Confirmed by Wandra Arthurs [phone]) on 09/13/2020 9:11:36 PM   Radiology CT Head Wo Contrast  Result Date: 09/13/2020 CLINICAL DATA:  Dizziness and headaches, initial encounter EXAM: CT HEAD WITHOUT CONTRAST TECHNIQUE: Contiguous axial images were obtained from the base of the skull through the vertex without intravenous contrast. COMPARISON:  MRI from 05/03/2018 FINDINGS: Brain: No evidence of acute infarction, hemorrhage, hydrocephalus, extra-axial collection or mass lesion/mass effect. Mild atrophic changes are noted. Vascular: No hyperdense vessel or unexpected calcification. Skull: Normal. Negative for fracture or focal lesion. Sinuses/Orbits: No acute finding. Other: None. IMPRESSION: Mild atrophic changes without acute abnormality. Electronically Signed   By: Inez Catalina M.D.   On: 09/13/2020 22:29    Procedures Procedures   Medications Ordered in ED Medications - No data to display  ED Course  I have reviewed the triage  vital signs and the nursing notes.  Pertinent labs & imaging results that were available during my care of the patient were reviewed by me and considered in my medical decision making (see chart for details).    MDM Rules/Calculators/A&P                         [name] is a 75 y.o. male here with dizziness.  Patient is lightheaded when he stood up.  I think likely some orthostasis.  Low suspicion for stroke.  Will get CT head is negative will not need MRI brain.  We will also get basic blood work and hydrate patient.   10:34 PM Labs and CT head unremarkable.  Patient has no vomiting and ambulated by himself. Stable for discharge.  Final Clinical Impression(s) / ED Diagnoses Final diagnoses:  None    Rx / DC Orders ED Discharge Orders    None       Drenda Freeze, MD 09/13/20 2235

## 2020-09-16 ENCOUNTER — Other Ambulatory Visit: Payer: Self-pay | Admitting: Internal Medicine

## 2020-09-18 DIAGNOSIS — M4800 Spinal stenosis, site unspecified: Secondary | ICD-10-CM | POA: Diagnosis not present

## 2020-09-18 DIAGNOSIS — G8929 Other chronic pain: Secondary | ICD-10-CM | POA: Diagnosis not present

## 2020-09-18 DIAGNOSIS — Z7952 Long term (current) use of systemic steroids: Secondary | ICD-10-CM | POA: Diagnosis not present

## 2020-09-18 DIAGNOSIS — I2510 Atherosclerotic heart disease of native coronary artery without angina pectoris: Secondary | ICD-10-CM | POA: Diagnosis not present

## 2020-09-18 DIAGNOSIS — I5022 Chronic systolic (congestive) heart failure: Secondary | ICD-10-CM | POA: Diagnosis not present

## 2020-09-18 DIAGNOSIS — I739 Peripheral vascular disease, unspecified: Secondary | ICD-10-CM | POA: Diagnosis not present

## 2020-09-18 DIAGNOSIS — E785 Hyperlipidemia, unspecified: Secondary | ICD-10-CM | POA: Diagnosis not present

## 2020-09-18 DIAGNOSIS — Z77098 Contact with and (suspected) exposure to other hazardous, chiefly nonmedicinal, chemicals: Secondary | ICD-10-CM | POA: Diagnosis not present

## 2020-09-18 DIAGNOSIS — I480 Paroxysmal atrial fibrillation: Secondary | ICD-10-CM | POA: Diagnosis not present

## 2020-09-18 DIAGNOSIS — I700 Atherosclerosis of aorta: Secondary | ICD-10-CM | POA: Diagnosis not present

## 2020-09-18 DIAGNOSIS — I110 Hypertensive heart disease with heart failure: Secondary | ICD-10-CM | POA: Diagnosis not present

## 2020-09-18 DIAGNOSIS — J449 Chronic obstructive pulmonary disease, unspecified: Secondary | ICD-10-CM | POA: Diagnosis not present

## 2020-09-19 NOTE — Progress Notes (Signed)
Subjective:    Patient ID: [name], male    DOB: [birth year], 75 y.o.   MRN: [id number] HPI  Male never smoker followed for allergy/asthma, rhinitis, history  ABPA, complicated by PAfib, GERD PFT- 01/04/2015-minimal restriction, minimal diffusion defect, insignificant response to bronchodilator. FVC 4.55/86%, FEV1 3.28/83%, FEV1/FVC 0.72, TLC 75%, DLCO 77 Office Spirometry 10/25/16-mild restriction of exhaled volume, minimal obstruction. FVC 4.04/78%, FEV1 2.71/71%, ratio 0.67, FEF 25-75% 1.68/58% Respiratory allergy profile 10/26/15- total IgE 60, minor elevations for dust mites and Aspergillus fumigatus ---------------------------------------------------------------------------------------------------.   03/22/20- 75 year old male never smoker followed for allergy/asthma, rhinitis, history ABPA, complicated by PAfib, GERD, Hypogammaglobulinemia,  Striverdi 2 puffs daily, pred maint 10 mg daily, Neb Duoneb, Qvar 80 2 p bid, albuterol hfa ED 10/4 for swelling R lower leg. Rx'd lovenox, doxy. [place] neg for DVT Covid vax- 3 Phizer Flu vax- had Breathing is stable with occ mild wheeze noted. Meds are sufficient. Asks refill neb solution.  Took augmentin then cipro for serratia cellulitis leg. Back down to 10 mg daily maintenance prednisone.  CXR 09/21/19- The heart size and mediastinal contours are within normal limits. No acute abnormality of the lungs. Partially imaged cervical and lumbar fusion hardware. Left shoulder reverse total arthroplasty. IMPRESSION: No acute abnormality of the lungs.  09/19/20- 75 year old male never smoker followed for allergy/asthma, rhinitis, history ABPA, complicated by PAfib, CHF, GERD, Hypogammaglobulinemia, Chronic Steroid Therapy,  Striverdi 2 puffs daily, pred maint 10 mg daily, Neb Duoneb,  albuterol hfa ED visit 09/13/20- .dizzy> orthostasis Leg Dopplers 3/9- neg for DVT -----Patient states he has a dry cough, breathing is good.  Dry cough with pollen  season Low BP confirmed 86/60 both arms, 2 nurses. On Atacand,cardizem and prn lasix He denies noting orthostatic dizziness- but we note recent ED visit for same. [name]  He considers breathing stable except for the cough. Long term maintenance prednisone controlled bronchospasm better than anything else, and can  assume he is now adrenal insufficient.  ROS-see HPI   + = positive Constitutional:    weight loss, night sweats, fevers, chills, fatigue, lassitude. HEENT:    headaches, difficulty swallowing, tooth/dental problems, sore throat,       sneezing, itching, ear ache, nasal congestion, post nasal drip, snoring CV:    chest pain, orthopnea, PND, +swelling in lower extremities, anasarca,                                           dizziness, palpitations Resp:   + shortness of breath with exertion or at rest.                productive cough,   non-productive cough, coughing up of blood.              change in color of mucus.   wheezing.   Skin:    rash or lesions. GI:  No-   heartburn, indigestion, abdominal pain, nausea, vomiting, diarrhea,                 change in bowel habits, loss of appetite GU: dysuria, change in color of urine, no urgency or frequency.   flank pain. MS:   joint pain, stiffness, decreased range of motion, back pain.. L scapula not tender. Neuro-     nothing unusual Psych:  change in mood or affect.  depression or anxiety.   memory loss.  Objective:  OBJ- Physical Exam General- Alert, Oriented, Affect-appropriate, Distress- none acute, tall/thin Skin- rash-none, lesions- none, excoriation- none Lymphadenopathy- none Head- atraumatic            Eyes- Gross vision intact, PERRLA, conjunctivae and secretions clear            Ears- Hearing, canals-normal            Nose- Clear, no-Septal dev, mucus, polyps, erosion, perforation             Throat- Mallampati II , mucosa clear , drainage- none, tonsils- atrophic Neck- flexible , trachea midline, no stridor , thyroid nl,  carotid no bruit Chest - symmetrical excursion , unlabored           Heart/CV- RRR , no murmur , no gallop  , no rub, nl s1 s2                           - JVD- none , edema- none, stasis changes- none, varices+ Left calf           Lung- +distant/ clear wheeze- none, cough- none , dullness-none, rub- none           Chest wall-  Abd-  Br/ Gen/ Rectal- Not done, not indicated Extrem- cyanosis- none, clubbing, none, atrophy- none, strength- nl Neuro- + head bob and hands tremor    Assessment & Plan:

## 2020-09-20 ENCOUNTER — Encounter: Payer: Self-pay | Admitting: Neurology

## 2020-09-20 ENCOUNTER — Ambulatory Visit (INDEPENDENT_AMBULATORY_CARE_PROVIDER_SITE_OTHER): Payer: Medicare Other | Admitting: Neurology

## 2020-09-20 ENCOUNTER — Other Ambulatory Visit: Payer: Self-pay

## 2020-09-20 ENCOUNTER — Ambulatory Visit (INDEPENDENT_AMBULATORY_CARE_PROVIDER_SITE_OTHER): Payer: Medicare Other

## 2020-09-20 ENCOUNTER — Ambulatory Visit (INDEPENDENT_AMBULATORY_CARE_PROVIDER_SITE_OTHER): Payer: Medicare Other | Admitting: Internal Medicine

## 2020-09-20 ENCOUNTER — Encounter: Payer: Self-pay | Admitting: Internal Medicine

## 2020-09-20 VITALS — BP 104/64 | HR 87 | Ht 74.0 in | Wt 170.0 lb

## 2020-09-20 VITALS — BP 86/60 | HR 80 | Temp 97.0°F | Ht 74.0 in | Wt 171.0 lb

## 2020-09-20 DIAGNOSIS — R296 Repeated falls: Secondary | ICD-10-CM | POA: Diagnosis not present

## 2020-09-20 DIAGNOSIS — G250 Essential tremor: Secondary | ICD-10-CM | POA: Diagnosis not present

## 2020-09-20 DIAGNOSIS — I480 Paroxysmal atrial fibrillation: Secondary | ICD-10-CM | POA: Diagnosis not present

## 2020-09-20 DIAGNOSIS — M79604 Pain in right leg: Secondary | ICD-10-CM

## 2020-09-20 DIAGNOSIS — J4540 Moderate persistent asthma, uncomplicated: Secondary | ICD-10-CM

## 2020-09-20 DIAGNOSIS — M79605 Pain in left leg: Secondary | ICD-10-CM | POA: Diagnosis not present

## 2020-09-20 DIAGNOSIS — G629 Polyneuropathy, unspecified: Secondary | ICD-10-CM | POA: Diagnosis not present

## 2020-09-20 DIAGNOSIS — R2689 Other abnormalities of gait and mobility: Secondary | ICD-10-CM

## 2020-09-20 DIAGNOSIS — J45909 Unspecified asthma, uncomplicated: Secondary | ICD-10-CM | POA: Diagnosis not present

## 2020-09-20 MED ORDER — GABAPENTIN 100 MG PO CAPS: 100.0000 mg | ORAL_CAPSULE | Freq: Three times a day (TID) | ORAL | 3 refills | Status: DC

## 2020-09-20 NOTE — Patient Instructions (Signed)
It was good to see you again today.  Please stay well hydrated with water, 6 to 8 cups/day are recommended, 8 ounce size each.  Change positions slowly especially if you have been seated for a while.  Use your walker, I think he would be more stable with a walker rather than your four-point cane.  Unfortunately, there is not a whole lot of medication options that help with nerve pain and everything can have potential side effects.  In light of your other medications and your history of falls and balance issues, we will be very cautious with medication.  As discussed, we can try you on low-dose Neurontin (gabapentin) 100 mg strength: Take 1 pill nightly at bedtime for 1 week, then 1 pill twice daily for 1 week, then 1 pill 3 times a day thereafter.    As discussed, side effects include but are not limited to: sedation or sleepiness, balance problems, confusion.   Be extra aware that you could be off balance and sleepy if you have to get up at night.  Please follow-up routinely to see one of our nurse practitioners in 3 months, sooner if needed.  If you have any interim questions or concerns, please do not hesitate to call or email [place] through [place].

## 2020-09-20 NOTE — Progress Notes (Signed)
Subjective:    Patient ID: Levi [name] is a 75 y.o. male.  HPI     Interim history:   Levi [name] is a 75 year old right-handed gentleman with an underlying  Complex medical history of asthma, A. fib, hyperlipidemia, reflux disease with hiatal hernia, diverticulitis, degenerative disc disease, COPD, allergic rhinitis, history of agent orange exposure in the 70s, carpal tunnel syndrome, status post bilateral carpal tunnel release and ulnar release surgeries in January and February of this year, status post multiple surgeries including lumbar spine surgery, neck surgery, bilateral hip replacements, bilateral knee replacements, shoulder surgeries, who Presents for follow-up consultation of his neuropathy.  I saw him at the request of [name] on 10/21/2019, at which time the patient reported a longstanding history of numbness and tingling and pain in both lower extremities.  Symptoms were more on the right.  He had extensive blood work on 10/21/2019.  A1c was 5.5, ANA was negative, CRP was less than 1, rheumatoid factor was less than 10, CK was 59, heavy metals in the blood was negative, CMP showed glucose of 115, BUN 27, creatinine 0.88, AST 19, ALT 15, TSH was mildly elevated at 5.33, multiple myeloma panel showed decreased in immunoglobulins with a pattern suggestive of hypogammaglobulinemia, RPR nonreactive, vitamin B12 low at 224, thiamine 206.2.  He was advised to seek consultation with hematology.  He was furthermore advised to follow-up with his primary care for B12 deficiency and abnormal TSH.  He had an EMG and nerve conduction velocity test through our office on 11/17/2019 and I reviewed the results:   Conclusion: This is an abnormal study.  There is electrodiagnostic evidence of mild bilateral lumbosacral radiculopathy, mainly involving bilateral L4, L5, S1 myotomes, there is no evidence of active process.  In addition, there is evidence of mild length dependent axonal sensorimotor  polyneuropathy.  We called him with his test results.  He saw hematology in July 2021, hypogammaglobulinemia was not considered to be significant at the time.  He did not have any history of recurrent infections.  He was not felt to need a bone marrow biopsy.  Today, 09/20/2020: He reports ongoing issues with leg pain, right more than left.  It has not really progressed since last time but he has additional joint pains including both hips, sometimes he takes Flexeril at bedtime.  He does report that his balance is off and he has had falls, as recent as 2 days ago.  He fell in the yard.  He has a walker but typically only uses his four-point cane.  He tries to hydrate with water.  He reports a recent brief episode of A. fib, he reports that he can tell when he is in A. fib.  He is due for an eye exam.  He had a recent episode of dizziness and lightheadedness and had double vision which lasted about 10 minutes.  He has an eye examination yearly. He had an interim ER visit on 09/13/2020 for dizziness, lightheadedness.  I reviewed the emergency room records.  He had a head CT without contrast on 09/13/2020 and I reviewed the results:  IMPRESSION: Mild atrophic changes without acute abnormality.  He was felt to have orthostasis.   He had recent right hand surgery on 08/07/2020 under Dr. [name].  He had right hand ligament reconstruction and tendon interposition.  Pain is improved and mobility is better since surgery he reports.  He is not taking any oxycodone at this time.  Does take Flexeril as  needed, typically only once at bedtime.  Previously:   10/21/19: (He) reports a longerstanding Hx of Numbness and tingling as well as pain in both lower extremities, right more than left as well as some numbness and tingling in both hands and forearms.  He is unsure as to when his symptoms started, probably several years ago.  He has had falls.  He has felt imbalanced.  He has started using a cane but did not bring  it today.  He denies a family history of neuropathy or hereditary muscle diseases, is not diabetic.  He does not drink alcohol and has not had any alcohol in years, probably 40 years. He is a non-smoker.  He tried gabapentin some time ago for his back, he is unsure if it helped but does not recall having any side effects.  He reports that he had shingles in the past and this affected his left-sided truncal area and he tried Lyrica for it which did not help.  He tries to hydrate well but does report mouth dryness. He had recent EMG nerve conduction velocity testing through your office on 09/27/2019, supportive of sensorimotor peripheral neuropathy with some superimposed residuals from prior lumbar radiculopathy contributing to the EMG findings.   He had a CT myelogram of the spine on 05/12/19, and I reviewed the results: IMPRESSION: Cervical spine:   1. C4-C6 ACDF with solid arthrodesis and patent canal/foramina. 2. C6-7 interval disc arthroplasty with patent canal and foramina. 3. C3-4 chronic right foraminal impingement mainly from uncovertebral spurring.   Thoracic spine:   1. T2-3 focal advanced disc degeneration with height loss causing biforaminal impingement. 2. Spondylosis and disc fissuring at T7-8 below. 3. Diffusely patent spinal canal.   Lumbar spine:   1. T12-S1 fusion. Notable vacuum phenomenon at the L4-5 disc space with right S1 pedicle screw fracture, but definite solid posterior-lateral arthrodesis at L4-5. Solid arthrodesis is also seen at the other levels. 2. L4-5 subarticular recess narrowing on the right more than left with overall mild central stenosis. Previously:   04/21/2018:   75 year old right-handed gentleman with an underlying complex medical history of paroxysmal A. fib, COPD, reflux disease, hyperlipidemia, allergic rhinitis, coronary artery disease, gout, degenerative joint disease, anxiety, history of spinal stenosis, status post multiple surgeries  including bilateral knee surgeries, foot surgeries, tonsillectomy, cholecystectomy, back surgeries, shoulder surgeries, neck surgery, and status post total hip replacement b/l, hardware in the back, who reports a sense of dizziness and intermittent nausea for the past 2-3 months. He reports intermittent vertiginous symptoms when laying in bed but has had lightheadedness when sitting and standing. He has had multiple falls, he has significant mobility issues, multiple joint related and back surgeries. He has significant degenerative disease of the entire spine, sees [name].  I reviewed your office note from 04/10/2018, which you kindly included. He was treated for vertigo symptoms with meclizine 12.5 mg 3 times a day for about 10 days. His wife has noticed a tremor. He has a family history of Parkinson's disease on his maternal side, 2 aunts, as he recalls, no family history of essential tremor. He tried meclizine but did not find it helpful. Of note, he is on a nebulizer as needed as well as albuterol as needed. He is on chronic prednisone, currently 10 mg daily. He tries to hydrate well but likes to drink diet soda, estimates that he can drink half a gallon of diet orange soda. He has a family history of stroke in his father who  died at the age of 20 from a heart attack but also had a stroke. Paternal grandparents had strokes. Mom died at age 110 from diabetes complications. He has had a tremor affecting his right more than left hand and his head and neck area for at least one year, likely longer he believes. He is retired, lives with his wife. He does not smoke, does not currently drink alcohol.  His Past Medical History Is Significant For: Past Medical History:  Diagnosis Date  . Agent orange exposure 1970's   takes Imdur,Diltiazem, and Atacand daily  . ALLERGIC RHINITIS   . Allergy    seasonal allergies  . Asthma    uses inhaler and nebulizers  . Bruises easily    d/t prednisone daily  .  Cataract   . CHF (congestive heart failure) ([facility])    dx-on meds to treat  . Chronic bronchitis ([facility])    "used to get it q yr; last time ?2008" (04/28/2012)  . COPD (chronic obstructive pulmonary disease) ([facility])    agent orange exposure  . Degenerative disk disease    "everywhere" (04/28/2012)  . Diverticulitis   . Dysrhythmia    afib  . Elevated uric acid in blood    takes Allopurinol daily  . Emphysema of lung ([facility])   . Enlarged prostate    but not on any meds  . GERD (gastroesophageal reflux disease)    takes Protonix daily  . H/O hiatal hernia   . Headache   . History of pneumonia    a. 2010  . Hyperlipidemia    takes Lipitor daily; pt. states he takes a preventive  . Joint pain   . Joint swelling   . Neuromuscular disorder ([facility])    bilat legs, and bilat arms  . PAF (paroxysmal atrial fibrillation) ([facility])    a. Dx 04/2012, CHA2DS2VASc = 1 (age);  b. 04/2012 Echo: EF 40-50%, mild MR.  . Personal history of colonic adenomas 02/09/2013  . Shortness of breath dyspnea   . Thyroid disease    no taking meds-caused dizziness    His Past Surgical History Is Significant For: Past Surgical History:  Procedure Laterality Date  . ANTERIOR CERVICAL DECOMP/DISCECTOMY FUSION N/A 07/21/2012   Procedure: ANTERIOR CERVICAL DECOMPRESSION/DISCECTOMY FUSION 2 LEVELS;  Surgeon: [name], MD;  Location: [facility];  Service: Neurosurgery;  Laterality: N/A;  C4-5 C5-6 Anterior cervical decompression/diskectomy/fusion  . ANTERIOR LAT LUMBAR FUSION N/A 04/07/2017   Procedure: Thoracic twelve-Lumbar one Anterolateral decompression/fusion;  Surgeon: [name], MD;  Location: [facility];  Service: Neurosurgery;  Laterality: N/A;  . BACK SURGERY     x 3  . CARDIAC CATHETERIZATION  11/2008   [name] - 20% calcified non flow limiting left main, 50% EF apical hypokinesis  . CERVICAL DISC ARTHROPLASTY N/A 04/07/2017   Procedure: Cervical six-seven Disc arthroplasty;  Surgeon: [name], MD;   Location: [facility];  Service: Neurosurgery;  Laterality: N/A;  . CHEST TUBE INSERTION Left 04/11/2017   Procedure: CHEST TUBE INSERTION;  Surgeon: [name], MD;  Location: [facility];  Service: Thoracic;  Laterality: Left;  . CHOLECYSTECTOMY    . COLONOSCOPY  2016   CG-MAC-miralax(good)servere TICS/TA x 2  . ESOPHAGOGASTRODUODENOSCOPY    . EYE SURGERY Bilateral 2011   steroidal encapsulation" (04/28/2012)  . FOOT SURGERY Right    x 2  . KNEE ARTHROSCOPY Bilateral   . LATERAL / POSTERIOR COMBINED FUSION LUMBAR SPINE  2012  . POLYPECTOMY  2016   severe  TICS/TA x 2  . REVERSE SHOULDER ARTHROPLASTY  04/28/2012   Procedure: REVERSE SHOULDER ARTHROPLASTY;  Surgeon: [name], MD;  Location: [facility];  Service: Orthopedics;  Laterality: Left;  Left shouder reverse total shoulder arthroplasty  . SHOULDER SURGERY    . TENDON REPAIR Right 08/07/2020   Procedure: RIGHT HAND LIGAMENT RECONSTRUCTION AND TENDON INTERPOSITION;  Surgeon: [name], MD;  Location: [facility];  Service: Orthopedics;  Laterality: Right;  . [place]  . TOTAL HIP ARTHROPLASTY Left 2011   "left" (04/28/2012)  . TOTAL HIP ARTHROPLASTY Right 07/31/2015   Procedure: TOTAL HIP ARTHROPLASTY ANTERIOR APPROACH;  Surgeon: [name], MD;  Location: [facility];  Service: Orthopedics;  Laterality: Right;  . TOTAL KNEE ARTHROPLASTY  01/10/2012   Procedure: TOTAL KNEE ARTHROPLASTY;  Surgeon: [name], MD;  Location: [facility];  Service: Orthopedics;;  left total knee arthroplasty  . TOTAL KNEE ARTHROPLASTY Right 06/17/2014   Procedure: TOTAL KNEE ARTHROPLASTY;  Surgeon: [name], MD;  Location: [facility];  Service: Orthopedics;  Laterality: Right;    His Family History Is Significant For: Family History  Problem Relation Age of Onset  . Heart disease Father        Died 74, MI  . Diabetes Mother   . Prostate cancer Brother   . Colon polyps Brother   . Colon polyps Brother   .  Colon cancer Other   . Esophageal cancer Neg Hx   . Rectal cancer Neg Hx   . Stomach cancer Neg Hx   . Pancreatic cancer Neg Hx   . Kidney disease Neg Hx   . Liver disease Neg Hx     His Social History Is Significant For: Social History   Socioeconomic History  . Marital status: Married    Spouse name: Not on file  . Number of children: 1  . Years of education: Not on file  . Highest education level: Not on file  Occupational History  . 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 Smoking status: Never Smoker  . Smokeless tobacco: Never Used  Vaping Use  . Vaping Use: Never used  Substance and Sexual Activity  . Alcohol use: No    Alcohol/week: 0.0 standard drinks  . Drug use: No  . Sexual activity: Not on file  Other Topics Concern  . 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 Morphine And Related Palpitations and Other (See Comments)    Made patient light-headed and sweaty; "my heart races" (04/28/2012), pt states too much morphine gave him this reaction  . Levaquin [Levofloxacin] Itching  . Fluticasone-Salmeterol Other (See Comments)    Patient somewhat disputes this (??)  :   His Current Medications Are:  Outpatient Encounter Medications as of 09/20/2020  Medication Sig  . albuterol (VENTOLIN HFA) 108 (90 Base) MCG/ACT inhaler Inhale 2 puffs into the lungs every 4 (  four) hours as needed for wheezing or shortness of breath. (Patient taking  differently: Inhale 2 puffs into the lungs every 4 (four) hours as needed for wheezing or shortness of breath.)  . allopurinol (ZYLOPRIM) 100 MG tablet Take 100 mg by mouth daily.  [name] aspirin EC 81 MG tablet Take 1 tablet (81 mg total) by mouth daily.  [name] atorvastatin (LIPITOR) 20 MG tablet TAKE 1 TABLET BY MOUTH EVERY DAY  . candesartan (ATACAND) 4 MG tablet TAKE 1/2 TABLETS (2 MG TOTAL) BY MOUTH DAILY.  . ciprofloxacin (CIPRO) 500 MG tablet Take 1 tablet (500 mg total) by mouth 2 (two) times daily.  . cyclobenzaprine (FLEXERIL) 10 MG tablet Take 10 mg by mouth at bedtime as needed for muscle spasms.  [name] diltiazem (CARDIZEM CD) 180 MG 24 hr capsule TAKE 1 CAPSULE BY MOUTH EVERY DAY  . diltiazem (CARDIZEM) 30 MG tablet Take 30 mg by mouth as needed.  . furosemide (LASIX) 20 MG tablet Take 1 tablet (20 mg total) by mouth daily as needed. For shortness of breath and lower limb edema  . ipratropium-albuterol (DUONEB) 0.5-2.5 (3) MG/3ML SOLN 1 NEBULE EVERY 6 HOURS AS NEEDED. **J45.3**  . isosorbide mononitrate (IMDUR) 30 MG 24 hr tablet TAKE 1 TABLET (30 MG TOTAL) BY MOUTH DAILY.  . Nebulizers (COMPRESSOR/NEBULIZER) MISC Use as directed  . omeprazole (PRILOSEC) 40 MG capsule Take 1 capsule (40 mg total) by mouth 2 (two) times daily before a meal.  . oxyCODONE-acetaminophen (PERCOCET/ROXICET) 5-325 MG tablet Take 1-2 tablets by mouth every 6 (six) hours as needed for severe pain.  . predniSONE (DELTASONE) 10 MG tablet TAKE 2 TABLETS (20 MG TOTAL) BY MOUTH DAILY. CONTINUOUS  . STRIVERDI RESPIMAT 2.5 MCG/ACT AERS INHALE 2 PUFFS BY MOUTH INTO THE LUNGS DAILY  . tamsulosin (FLOMAX) 0.4 MG CAPS capsule Take 0.4 mg by mouth at bedtime.    No facility-administered encounter medications on file as of 09/20/2020.  :  Review of Systems:  Out of a complete 14 point review of systems, all are reviewed and negative with the exception of these symptoms as listed below:  Review of Systems  Neurological:       Pt  here for follow up with worsening neuropathy in BLE but worse in RLE. States increase in numbness and tingling and painful. On 4/20, pt presented to the ER. Patient reports that afternoon he got out of the shower and started to feel very lightheaded like he was going to pass out and noted that he was having some double vision and everything looked slanted. Pt seems to be unclear if he had a LOC. stated one min he was on the ground and when he opened his eye he noticed the vision disturbance. Did report an irregular heartbeat at this time and has known history of A. fib. CT completed, negative.     Objective:  Neurological Exam  Physical Exam Physical Examination:   Vitals:   09/20/20 0721  BP: 104/64  Pulse: 87    General Examination: The patient is a very pleasant 75 y.o. male in no acute distress. He appears well-developed and well-nourished and well groomed.   HEENT:Normocephalic, atraumatic, pupils are equal, round and reactive to light, has a side-to-side head tremor, intermittent. Hearing is mildly impaired. Face is symmetric, no obvious facial masking, nuchal tone is fairly good. He does have limitation to neck movements, normal facial sensation.  Airway examination reveals moderate to severe mouth dryness, no abnormal tongue or mouth movements, no lip  tremor. Tongue protrudes centrally and palate elevates symmetrically.   Chest:Clear to auscultation without wheezing, rhonchi or crackles noted.  Heart:S1+S2+0, regular and normal without murmurs, rubs or gallops noted.   Abdomen:Soft, non-tender and non-distended with normal bowel sounds appreciated on auscultation.  Extremities:There is1+pitting edema in the distal lower extremities bilaterally.   Skin: Warm and dry with chronic appearing bruising and age-related changes noted.  Also mild erythema in the distal lower extremities bilaterally.  Musculoskeletal: exam revealssignificant limitation in joint mobility in  both hips and knees, scars both knees.   Recent appearing scar right hand, thumb joint.  Neurologically:  Mental status: The patient is awake, alert and oriented in all 4 spheres.Hisimmediate and remote memory, attention, language skills and fund of knowledge are appropriate. There is no evidence of aphasia, agnosia, apraxia or anomia. Speech is clear with normal prosody and enunciation. Thought process is linear. Mood is normaland affect is normal.  Cranial nerves II - XII are as described above under HEENT exam.  Motor exam:thin bulk, global strength of 4 out of 5, slightly weaker on the right compared to left, especially right hip flexor of 4- out of 5.  Foot dorsiflexion also 4- out of 5 on the right.   Fine motor skills with finger taps and foot taps are impaired globally. He has limitation in ankle mobility bilaterally, right more than left. Romberg is not testable safely. Reflexes are trace in the upper extremities and absent in the lower extremities. Cerebellar testing: No dysmetria or intention tremor. Heel to shin isnot possible. Sensory exam: Impaired to all modalities in the distal lower extremities up to mid calf on the left and up to the knee on the right, also decreased sensation to all modalities in both hands, up to above wrist area bilaterally.   Gait, station and balance:Hestands with difficulty and has to push himself up. He stands wide-based. He walks slowly and cautiously, no shuffling, no festination, fairly preserved arm swing bilaterally.  He walks in securely, he has trouble with turns, balance is impaired.  He did not bring a walking aid.    Assessmentand Plan:   In summary,Levi A Kerberis a very pleasant 26 year oldmalewith an underlying complex medical history of paroxysmal A. fib, COPD, reflux disease, hyperlipidemia, allergic rhinitis, coronary artery disease, gout, degenerative joint disease, anxiety, history of spinal stenosis, status post multiple  surgeries including bilateral knee surgeries, foot surgeries, tonsillectomy, cholecystectomy, back surgeries, shoulder surgeries, neck surgery, and status post total hip replacement b/l, hardware in the back, whopresents for follow-up consultation of his neuropathy.  EMG and nerve conduction velocity testing confirmed evidence of chronic neuropathy.  He has had gait instability, balance issues and recurrent falls, he has multiple joint replacement surgeries and ongoing issues with significant arthritis and pain.  He is encouraged to use his walker over his cane.  He had a recent syncopal or presyncopal spell which prompted an ER visit.  He did not have any sudden onset of one-sided weakness or numbness or tingling or droopy face or slurring of speech.  We talked about his fall risk.  He had tried Lyrica in the past for shingles pain which as per his recollection exacerbated the pain.  He had tried gabapentin for back pain in the past, is not sure if it helped at the time, would be willing to try it.  I advised him that we will be extremely cautious with medications because of side effect risks including dizziness and sedation.  To that end,  we will start him on the lowest dose of gabapentin, 100 mg strength 1 pill at bedtime for the first week and gradually increase to twice daily and then 3 times a day eventually if tolerated.  He is advised to follow-up with one of our nurse practitioners in 3 months for medication check and symptom check.  He is strongly advised to call us with any interim questions or concerns or email [place] through [facility].  He is advised to schedule his eye appointment if he is due.  He had a recent episode of double vision in the context of lightheadedness and presyncopal symptoms.  He is encouraged to stay well-hydrated. I answered all of his questions today and he was in agreement. I spent 33 minutes in total face-to-face time and in reviewing records during pre-charting, more than 50% of  which was spent in counseling and coordination of care, reviewing test results, reviewing medications and treatment regimen and/or in discussing or reviewing the diagnosis of Neuropathy, falls, the prognosis and treatment options. Pertinent laboratory and imaging test results that were available during this visit with the patient were reviewed by me and considered in my medical decision making (see chart for details).

## 2020-09-20 NOTE — Assessment & Plan Note (Signed)
Chronic systemic steroid. Feels well-controlled. Recent cough likely reflects current pollen season, in which case it should clear soon.  Plan- CXR, otc cough remedies.

## 2020-09-20 NOTE — Assessment & Plan Note (Signed)
Low BP today. Recent ER for orthostatic dizziness Plan- he will call his cardiology office to report.

## 2020-09-20 NOTE — Patient Instructions (Signed)
Order- CXR   Dx Asthma moderate persistent uncomplicated  If your cough is just from pollen, that should fade in next few weeks. You can try otc cough syrup like Delsym or Robitussin DM, and throat lozenges if needed. If these don't help and cough persists, please let me know.   Please let Dr Kingsley Plan office know that your blood pressure was low today  86/ 60.  Please call us if we can help

## 2020-09-21 ENCOUNTER — Telehealth: Payer: Self-pay | Admitting: Neurology

## 2020-09-21 NOTE — Telephone Encounter (Signed)
At 12:59 pt left a vm asking for a call to discuss how he is supposed to take the gabapentin (NEURONTIN) 100 MG capsule based on what Dr [name] said versus what is said to do on the prescription bottle, please call

## 2020-09-21 NOTE — Telephone Encounter (Signed)
I called the pt and advised of the Gabapentin dosage:Take 1 pill nightly at bedtime for 1 week, then 1 pill twice daily for 1 week, then 1 pill 3 times a day thereafter.  I left this information on the vm of the pt. Asked him to call back if he had any questions.

## 2020-09-22 DIAGNOSIS — M1811 Unilateral primary osteoarthritis of first carpometacarpal joint, right hand: Secondary | ICD-10-CM | POA: Diagnosis not present

## 2020-09-22 DIAGNOSIS — Z4889 Encounter for other specified surgical aftercare: Secondary | ICD-10-CM | POA: Diagnosis not present

## 2020-09-27 DIAGNOSIS — G629 Polyneuropathy, unspecified: Secondary | ICD-10-CM | POA: Diagnosis not present

## 2020-09-27 DIAGNOSIS — L03116 Cellulitis of left lower limb: Secondary | ICD-10-CM | POA: Diagnosis not present

## 2020-09-27 DIAGNOSIS — I2510 Atherosclerotic heart disease of native coronary artery without angina pectoris: Secondary | ICD-10-CM | POA: Diagnosis not present

## 2020-09-27 DIAGNOSIS — E785 Hyperlipidemia, unspecified: Secondary | ICD-10-CM | POA: Diagnosis not present

## 2020-09-27 DIAGNOSIS — J449 Chronic obstructive pulmonary disease, unspecified: Secondary | ICD-10-CM | POA: Diagnosis not present

## 2020-09-27 DIAGNOSIS — I480 Paroxysmal atrial fibrillation: Secondary | ICD-10-CM | POA: Diagnosis not present

## 2020-09-27 DIAGNOSIS — K219 Gastro-esophageal reflux disease without esophagitis: Secondary | ICD-10-CM | POA: Diagnosis not present

## 2020-09-27 DIAGNOSIS — M109 Gout, unspecified: Secondary | ICD-10-CM | POA: Diagnosis not present

## 2020-10-04 DIAGNOSIS — M79671 Pain in right foot: Secondary | ICD-10-CM | POA: Diagnosis not present

## 2020-10-12 ENCOUNTER — Other Ambulatory Visit: Payer: Self-pay | Admitting: Internal Medicine

## 2020-10-20 DIAGNOSIS — N401 Enlarged prostate with lower urinary tract symptoms: Secondary | ICD-10-CM | POA: Diagnosis not present

## 2020-10-20 DIAGNOSIS — R296 Repeated falls: Secondary | ICD-10-CM | POA: Diagnosis not present

## 2020-10-20 DIAGNOSIS — R7989 Other specified abnormal findings of blood chemistry: Secondary | ICD-10-CM | POA: Diagnosis not present

## 2020-10-20 DIAGNOSIS — Z7952 Long term (current) use of systemic steroids: Secondary | ICD-10-CM | POA: Diagnosis not present

## 2020-10-20 DIAGNOSIS — J449 Chronic obstructive pulmonary disease, unspecified: Secondary | ICD-10-CM | POA: Diagnosis not present

## 2020-10-20 DIAGNOSIS — I5189 Other ill-defined heart diseases: Secondary | ICD-10-CM | POA: Diagnosis not present

## 2020-10-20 DIAGNOSIS — R1319 Other dysphagia: Secondary | ICD-10-CM | POA: Diagnosis not present

## 2020-10-20 DIAGNOSIS — E785 Hyperlipidemia, unspecified: Secondary | ICD-10-CM | POA: Diagnosis not present

## 2020-10-20 DIAGNOSIS — I700 Atherosclerosis of aorta: Secondary | ICD-10-CM | POA: Diagnosis not present

## 2020-10-20 DIAGNOSIS — I2510 Atherosclerotic heart disease of native coronary artery without angina pectoris: Secondary | ICD-10-CM | POA: Diagnosis not present

## 2020-10-20 DIAGNOSIS — M1A9XX0 Chronic gout, unspecified, without tophus (tophi): Secondary | ICD-10-CM | POA: Diagnosis not present

## 2020-10-20 DIAGNOSIS — D692 Other nonthrombocytopenic purpura: Secondary | ICD-10-CM | POA: Diagnosis not present

## 2020-10-20 DIAGNOSIS — I5022 Chronic systolic (congestive) heart failure: Secondary | ICD-10-CM | POA: Diagnosis not present

## 2020-10-20 DIAGNOSIS — D801 Nonfamilial hypogammaglobulinemia: Secondary | ICD-10-CM | POA: Diagnosis not present

## 2020-10-20 DIAGNOSIS — G629 Polyneuropathy, unspecified: Secondary | ICD-10-CM | POA: Diagnosis not present

## 2020-10-20 DIAGNOSIS — I110 Hypertensive heart disease with heart failure: Secondary | ICD-10-CM | POA: Diagnosis not present

## 2020-10-20 DIAGNOSIS — G8929 Other chronic pain: Secondary | ICD-10-CM | POA: Diagnosis not present

## 2020-10-31 DIAGNOSIS — M4722 Other spondylosis with radiculopathy, cervical region: Secondary | ICD-10-CM | POA: Diagnosis not present

## 2020-10-31 DIAGNOSIS — M25561 Pain in right knee: Secondary | ICD-10-CM | POA: Diagnosis not present

## 2020-10-31 DIAGNOSIS — M542 Cervicalgia: Secondary | ICD-10-CM | POA: Diagnosis not present

## 2020-10-31 DIAGNOSIS — M25521 Pain in right elbow: Secondary | ICD-10-CM | POA: Diagnosis not present

## 2020-11-14 DIAGNOSIS — M25561 Pain in right knee: Secondary | ICD-10-CM | POA: Diagnosis not present

## 2020-11-14 DIAGNOSIS — M79671 Pain in right foot: Secondary | ICD-10-CM | POA: Diagnosis not present

## 2020-11-14 DIAGNOSIS — M542 Cervicalgia: Secondary | ICD-10-CM | POA: Diagnosis not present

## 2020-11-15 DIAGNOSIS — M4722 Other spondylosis with radiculopathy, cervical region: Secondary | ICD-10-CM | POA: Diagnosis not present

## 2020-11-15 DIAGNOSIS — I10 Essential (primary) hypertension: Secondary | ICD-10-CM | POA: Insufficient documentation

## 2020-11-15 DIAGNOSIS — M5481 Occipital neuralgia: Secondary | ICD-10-CM | POA: Diagnosis not present

## 2020-11-15 HISTORY — DX: Essential (primary) hypertension: I10

## 2020-11-24 DIAGNOSIS — M4722 Other spondylosis with radiculopathy, cervical region: Secondary | ICD-10-CM | POA: Diagnosis not present

## 2020-11-24 DIAGNOSIS — G629 Polyneuropathy, unspecified: Secondary | ICD-10-CM | POA: Diagnosis not present

## 2020-11-24 DIAGNOSIS — M5481 Occipital neuralgia: Secondary | ICD-10-CM | POA: Diagnosis not present

## 2020-11-29 ENCOUNTER — Telehealth: Payer: Self-pay | Admitting: Gastroenterology

## 2020-11-29 MED ORDER — FLUCONAZOLE 100 MG PO TABS: ORAL_TABLET | ORAL | 0 refills | Status: DC

## 2020-11-29 NOTE — Telephone Encounter (Signed)
Spoke with patient's wife she states that patient has been complaining of a "burning pain" in his esophagus for the past few days. She states that he previously had candida esophagitis and was treated with Fluconazole. Patient's symptoms seemed to have improved on the medication and they are requesting another prescription. Patient has been scheduled for a follow up on 12/25/20 at 9:40 am. Wife would prefer a sooner appt if necessary. Please advise, thanks.

## 2020-11-29 NOTE — Telephone Encounter (Signed)
Lm on vm for patient's wife to return call. 

## 2020-11-29 NOTE — Telephone Encounter (Signed)
Spoke with patient's wife in regards to recommendations. She is aware that I have sent patient's prescription to pharmacy on file. Advised patient's wife that patient will need to keep appt as scheduled. Patient's wife thanked me for the call. She verbalized understanding of all information and had no concerns at the end of the call.

## 2020-11-29 NOTE — Telephone Encounter (Signed)
Pt states that esophageal infection is back. He stated that is very painful and would like some medication if possible. Pls call his wife Bryson Ha.

## 2020-12-05 ENCOUNTER — Encounter: Payer: Self-pay | Admitting: Cardiology

## 2020-12-05 ENCOUNTER — Ambulatory Visit (INDEPENDENT_AMBULATORY_CARE_PROVIDER_SITE_OTHER): Payer: Medicare Other | Admitting: Cardiology

## 2020-12-05 ENCOUNTER — Other Ambulatory Visit: Payer: Self-pay

## 2020-12-05 VITALS — BP 110/70 | HR 82 | Ht 74.0 in | Wt 173.0 lb

## 2020-12-05 DIAGNOSIS — J449 Chronic obstructive pulmonary disease, unspecified: Secondary | ICD-10-CM

## 2020-12-05 DIAGNOSIS — I480 Paroxysmal atrial fibrillation: Secondary | ICD-10-CM | POA: Diagnosis not present

## 2020-12-05 MED ORDER — APIXABAN 5 MG PO TABS: 5.0000 mg | ORAL_TABLET | Freq: Two times a day (BID) | ORAL | 6 refills | Status: DC

## 2020-12-05 NOTE — Progress Notes (Addendum)
Cardiology Office Note:    Date:  12/05/2020   ID:  Levi [name], DOB 1946-01-22, MRN [id number]  PCP:  Shon Baton, MD   [facility] Providers Cardiologist:  Candee Furbish, MD     Referring MD: Orpah Melter, MD     History of Present Illness:    Levi [name] is a 76 y.o. male with history of atrial fibrillation COPD who went to the emergency department on 09/13/2020 with dizziness.  Went to the shower felt very lightheaded and dizzy double vision resolved.  Mild headaches.  No weakness no numbness.  No prior history of stroke.  EKG was performed at that time personally reviewed showed sinus rhythm heart rate 87 bpm no significant change from prior.  Right atrial enlargement noted.  He did not get MRI of brain.  CT was negative.  Labs are unremarkable.  Suspicion was that he had orthostasis.  Heart been pounding more, skips more. Nothing terrible. HR 130 during neck injection. Periodic leg swelling.   Did fall 2/21 - fractured hip. Fallen at times.   Candida/thrush- on fluconazole, esoph. EGD.  Has occasional leg wound that is healing.  Was told that this was Serratia martinans, a common organism found in " pond scum "   Past Medical History:  Diagnosis Date   Agent orange exposure 1970's   takes Imdur,Diltiazem, and Atacand daily   ALLERGIC RHINITIS    Allergy    seasonal allergies   Asthma    uses inhaler and nebulizers   Bruises easily    d/t prednisone daily   Cataract    CHF (congestive heart failure) ([place])    dx-on meds to treat   Chronic bronchitis ([place])    "used to get it q yr; last time ?2008" (04/28/2012)   COPD (chronic obstructive pulmonary disease) (HCC)    agent orange exposure   Degenerative disk disease    "everywhere" (04/28/2012)   Diverticulitis    Dysrhythmia    afib   Elevated uric acid in blood    takes Allopurinol daily   Emphysema of lung ([place])    Enlarged prostate    but not on any meds   GERD (gastroesophageal reflux disease)     takes Protonix daily   H/O hiatal hernia    Headache    History of pneumonia    a. 2010   Hyperlipidemia    takes Lipitor daily; pt. states he takes a preventive   Joint pain    Joint swelling    Neuromuscular disorder ([place])    bilat legs, and bilat arms   PAF (paroxysmal atrial fibrillation) ([place])    a. Dx 04/2012, CHA2DS2VASc = 1 (age);  b. 04/2012 Echo: EF 40-50%, mild MR.   Personal history of colonic adenomas 02/09/2013   Shortness of breath dyspnea    Thyroid disease    no taking meds-caused dizziness    Past Surgical History:  Procedure Laterality Date   ANTERIOR CERVICAL DECOMP/DISCECTOMY FUSION N/A 07/21/2012   Procedure: ANTERIOR CERVICAL DECOMPRESSION/DISCECTOMY FUSION 2 LEVELS;  Surgeon: [name], MD;  Location: [place] [facility];  Service: Neurosurgery;  Laterality: N/A;  C4-5 C5-6 Anterior cervical decompression/diskectomy/fusion   ANTERIOR LAT LUMBAR FUSION N/A 04/07/2017   Procedure: Thoracic twelve-Lumbar one Anterolateral decompression/fusion;  Surgeon: [name], MD;  Location: [place];  Service: Neurosurgery;  Laterality: N/A;   BACK SURGERY     x 3   CARDIAC CATHETERIZATION  11/2008   [name] - 20% calcified non flow  limiting left main, 50% EF apical hypokinesis   CERVICAL DISC ARTHROPLASTY N/A 04/07/2017   Procedure: Cervical six-seven Disc arthroplasty;  Surgeon: [name], MD;  Location: [place];  Service: Neurosurgery;  Laterality: N/A;   CHEST TUBE INSERTION Left 04/11/2017   Procedure: CHEST TUBE INSERTION;  Surgeon: [name], MD;  Location: [place];  Service: Thoracic;  Laterality: Left;   CHOLECYSTECTOMY     COLONOSCOPY  2016   CG-MAC-miralax(good)servere TICS/TA x 2   ESOPHAGOGASTRODUODENOSCOPY     EYE SURGERY Bilateral 2011   steroidal encapsulation" (04/28/2012)   FOOT SURGERY Right    x 2   KNEE ARTHROSCOPY Bilateral    LATERAL / POSTERIOR COMBINED FUSION LUMBAR SPINE  2012   POLYPECTOMY  2016   severe TICS/TA x 2   REVERSE  SHOULDER ARTHROPLASTY  04/28/2012   Procedure: REVERSE SHOULDER ARTHROPLASTY;  Surgeon: [name], MD;  Location: [place];  Service: Orthopedics;  Laterality: Left;  Left shouder reverse total shoulder arthroplasty   SHOULDER SURGERY     TENDON REPAIR Right 08/07/2020   Procedure: RIGHT HAND LIGAMENT RECONSTRUCTION AND TENDON INTERPOSITION;  Surgeon: [name], MD;  Location: [place];  Service: Orthopedics;  Laterality: Right;   [place]   TOTAL HIP ARTHROPLASTY Left 2011   "left" (04/28/2012)   TOTAL HIP ARTHROPLASTY Right 07/31/2015   Procedure: TOTAL HIP ARTHROPLASTY ANTERIOR APPROACH;  Surgeon: [name], MD;  Location: [place];  Service: Orthopedics;  Laterality: Right;   TOTAL KNEE ARTHROPLASTY  01/10/2012   Procedure: TOTAL KNEE ARTHROPLASTY;  Surgeon: [name], MD;  Location: [place];  Service: Orthopedics;;  left total knee arthroplasty   TOTAL KNEE ARTHROPLASTY Right 06/17/2014   Procedure: TOTAL KNEE ARTHROPLASTY;  Surgeon: [name], MD;  Location: [place];  Service: Orthopedics;  Laterality: Right;    Current Medications: Current Meds  Medication Sig   albuterol (VENTOLIN HFA) 108 (90 Base) MCG/ACT inhaler INHALE 2 PUFFS INTO THE LUNGS EVERY 4 (FOUR) HOURS AS NEEDED FOR WHEEZING OR SHORTNESS OF BREATH.   allopurinol (ZYLOPRIM) 100 MG tablet Take 100 mg by mouth daily.   apixaban (ELIQUIS) 5 MG TABS tablet Take 1 tablet (5 mg total) by mouth 2 (two) times daily.   atorvastatin (LIPITOR) 20 MG tablet TAKE 1 TABLET BY MOUTH EVERY DAY   candesartan (ATACAND) 4 MG tablet TAKE 1/2 TABLETS (2 MG TOTAL) BY MOUTH DAILY.   cyclobenzaprine (FLEXERIL) 10 MG tablet Take 10 mg by mouth at bedtime as needed for muscle spasms.   diltiazem (CARDIZEM CD) 180 MG 24 hr capsule TAKE 1 CAPSULE BY MOUTH EVERY DAY   diltiazem (CARDIZEM) 30 MG tablet Take 30 mg by mouth as needed.   fluconazole (DIFLUCAN) 100 MG tablet Take 2 tablets (200 mg  total) by mouth on day 1, then take 1 tablet (100 mg total) by mouth daily for 3 weeks   furosemide (LASIX) 20 MG tablet Take 1 tablet (20 mg total) by mouth daily as needed. For shortness of breath and lower limb edema   gabapentin (NEURONTIN) 100 MG capsule Take 1 capsule (100 mg total) by mouth 3 (three) times daily. Follow titration instructions provided separately.   ipratropium-albuterol (DUONEB) 0.5-2.5 (3) MG/3ML SOLN 1 NEBULE EVERY 6 HOURS AS NEEDED. **J45.3**   isosorbide mononitrate (IMDUR) 30 MG 24 hr tablet TAKE 1 TABLET (30 MG TOTAL) BY MOUTH DAILY.   Nebulizers (COMPRESSOR/NEBULIZER) MISC Use as directed   omeprazole (PRILOSEC) 40  MG capsule Take 1 capsule (40 mg total) by mouth 2 (two) times daily before a meal.   predniSONE (DELTASONE) 10 MG tablet TAKE 2 TABLETS (20 MG TOTAL) BY MOUTH DAILY. CONTINUOUS   STRIVERDI RESPIMAT 2.5 MCG/ACT AERS INHALE 2 PUFFS BY MOUTH INTO THE LUNGS DAILY   tamsulosin (FLOMAX) 0.4 MG CAPS capsule Take 0.4 mg by mouth at bedtime.    [DISCONTINUED] aspirin EC 81 MG tablet Take 1 tablet (81 mg total) by mouth daily.     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 There is no history of Esophageal cancer, Rectal cancer, Stomach cancer, Pancreatic cancer, Kidney disease, or Liver disease.  ROS:   Please see the history of present illness.     All other systems reviewed and are negative.  EKGs/Labs/Other Studies Reviewed:      Recent Labs: 02/28/2020: ALT 15 09/13/2020: BUN 22; Creatinine, Ser 0.92; Hemoglobin 13.5; Platelets 182; Potassium 4.0; Sodium 136  Recent Lipid Panel    Component Value Date/Time   CHOL 188 09/28/2019 1017   TRIG 69 09/28/2019 1017   HDL 109 09/28/2019 1017   CHOLHDL 1.7 09/28/2019 1017   CHOLHDL 2 10/20/2014 1122   VLDL 12.0 10/20/2014 1122   LDLCALC 66 09/28/2019 1017     Risk Assessment/Calculations:          Physical Exam:    VS:  BP 110/70 (BP Location: Left Arm, Patient Position: Sitting, Cuff Size: Normal)   Pulse 82   Ht _0  (1.88 m)   Wt 173 lb (78.5 kg)   SpO2 96%   BMI 22.21 kg/m     Wt Readings from Last 3 Encounters:  12/05/20 173 lb (78.5 kg)  09/20/20 171 lb (77.6 kg)  09/20/20 170 lb (77.1 kg)     GEN:  Well nourished, well developed in no acute distress HEENT: Normal NECK: No JVD; No carotid bruits LYMPHATICS: No lymphadenopathy CARDIAC: RRR, no murmurs, rubs, gallops RESPIRATORY:  Clear to auscultation without rales, wheezing or rhonchi  ABDOMEN: Soft, non-tender, non-distended MUSCULOSKELETAL:  No edema;  No deformity  SKIN: Warm and dry, healing left leg wound NEUROLOGIC:  Alert  and oriented x 3 PSYCHIATRIC:  Normal affect   ASSESSMENT:    1. Paroxysmal atrial fibrillation (HCC)   2. Chronic obstructive airway disease with asthma ([place])    PLAN:    In order of problems listed above:  Paroxysmal atrial fibrillation - At this time like he has had occasional episodes, more frequent. - Currently on auscultation he is in regular rhythm.  Continue with Cardizem CD 180 mg a day.  He also has 30 mg tablets as needed. - We will go ahead and start him on anticoagulation with Eliquis 5 mg twice a day.  His creatinine is normal and his age is less than 68.  Discussed potential bleeding.  Stopping aspirin 81 mg. - Given his prior serious falls with peripheral neuropathy, although he has not had a substantial fall recently, I would like for him this discussed with Dr. Quentin Ore of electrophysiology watchman device.  I would like to see if he is a candidate for this.  Hyperlipidemia - Continue with atorvastatin 20 mg once a day.  Last LDL 66, ALT 12  Chronic anticoagulation - New start as above Eliquis 5 mg twice a day.  Last hemoglobin 13.5 creatinine 0.92  COPD - Dr. Janee Morn note reviewed.  Stable.  Candidiasis/thrush - On fluconazole periodically.  EGD.  GI.  Mild LV dysfunction - EF previously 45%.  Has had soft blood pressures in the past limiting therapy.  Continue with current treatment strategy.  Symptoms are stable.  Progressive peripheral neuropathy - Has had falls in the past.   I have seen Levi [name] is a 75 y.o. male in the office today. The patient is felt to be a poor candidate for long-term anticoagulation because of falls, bleeding risk.  Their CHADS-2-Vasc Score and unadjusted Ischemic Stroke Rate (% per year) is equal to 2.2 % stroke rate/year from a score of 2, necessitating a strategy of stroke prevention with either long-term oral anticoagulation or left atrial appendage occlusion therapy. We have discussed their bleeding risk in the context of  their comorbid medical problems, as well as the rationale for referral for evaluation of Watchman left atrial appendage occlusion therapy. While the patient is at high long-term bleeding risk, they may be appropriate for short-term anticoagulation. Based on this individual patient's stroke and bleeding risk, a shared decision has been made to refer the patient for consideration of Watchman left atrial appendage closure utilizing the [employer] of Cardiology shared decision tool.        Medication Adjustments/Labs and Tests Ordered: Current medicines are reviewed at length with the patient today.  Concerns regarding medicines are outlined above.  Orders Placed This Encounter  Procedures   Ambulatory referral to Cardiac Electrophysiology   Meds ordered this encounter  Medications   apixaban (ELIQUIS) 5 MG TABS tablet    Sig: Take 1 tablet (5 mg total) by mouth 2 (two) times daily.    Dispense:  60 tablet    Refill:  6    Patient Instructions  Medication Instructions:  Please discontinue your Aspirin. Start Eliquis 5 mg one tablet twice a day.  *If you need a refill on your cardiac medications before your next appointment, please call your pharmacy*  You have been referred to see Dr Lars Mage to discuss Watchman Device.  Follow-Up: At [facility], you and your health needs are our priority.  As part of our continuing  mission to provide you with exceptional heart care, we have created designated Provider Care Teams.  These Care Teams include your primary Cardiologist (physician) and Advanced Practice Providers (APPs -  Physician Assistants and Nurse Practitioners) who all work together to provide you with the care you need, when you need it.  We recommend signing up for the patient portal called "MyChart".  Sign up information is provided on this After Visit Summary.  MyChart is used to connect with patients for Virtual Visits (Telemedicine).  Patients are able  to view lab/test results, encounter notes, upcoming appointments, etc.  Non-urgent messages can be sent to your provider as well.   To learn more about what you can do with MyChart, go to [email or web address].    Your next appointment:   6 month(s)  The format for your next appointment:   In Person  Provider:   You may see Candee Furbish, MD or one of the following Advanced Practice Providers on your designated Care Team:   Cecilie Kicks, NP    Thank you for choosing [facility]!!     Signed, Candee Furbish, MD  12/05/2020 10:40 AM    [place]

## 2020-12-05 NOTE — Patient Instructions (Signed)
Medication Instructions:  Please discontinue your Aspirin. Start Eliquis 5 mg one tablet twice a day.  *If you need a refill on your cardiac medications before your next appointment, please call your pharmacy*  You have been referred to see Dr Lars Mage to discuss Watchman Device.  Follow-Up: At [facility], you and your health needs are our priority.  As part of our continuing mission to provide you with exceptional heart care, we have created designated Provider Care Teams.  These Care Teams include your primary Cardiologist (physician) and Advanced Practice Providers (APPs -  Physician Assistants and Nurse Practitioners) who all work together to provide you with the care you need, when you need it.  We recommend signing up for the patient portal called "MyChart".  Sign up information is provided on this After Visit Summary.  MyChart is used to connect with patients for Virtual Visits (Telemedicine).  Patients are able to view lab/test results, encounter notes, upcoming appointments, etc.  Non-urgent messages can be sent to your provider as well.   To learn more about what you can do with MyChart, go to [email or web address].    Your next appointment:   6 month(s)  The format for your next appointment:   In Person  Provider:   You may see Candee Furbish, MD or one of the following Advanced Practice Providers on your designated Care Team:   Cecilie Kicks, NP    Thank you for choosing [facility]!!

## 2020-12-15 DIAGNOSIS — M4722 Other spondylosis with radiculopathy, cervical region: Secondary | ICD-10-CM | POA: Diagnosis not present

## 2020-12-15 DIAGNOSIS — R030 Elevated blood-pressure reading, without diagnosis of hypertension: Secondary | ICD-10-CM | POA: Diagnosis not present

## 2020-12-15 DIAGNOSIS — I10 Essential (primary) hypertension: Secondary | ICD-10-CM | POA: Diagnosis not present

## 2020-12-18 ENCOUNTER — Other Ambulatory Visit: Payer: Self-pay | Admitting: Neurological Surgery

## 2020-12-18 DIAGNOSIS — M4722 Other spondylosis with radiculopathy, cervical region: Secondary | ICD-10-CM

## 2020-12-20 ENCOUNTER — Other Ambulatory Visit: Payer: Self-pay

## 2020-12-20 ENCOUNTER — Ambulatory Visit (INDEPENDENT_AMBULATORY_CARE_PROVIDER_SITE_OTHER): Payer: Medicare Other | Admitting: Family Medicine

## 2020-12-20 ENCOUNTER — Encounter: Payer: Self-pay | Admitting: Family Medicine

## 2020-12-20 ENCOUNTER — Telehealth: Payer: Self-pay | Admitting: *Deleted

## 2020-12-20 VITALS — BP 118/73 | HR 78 | Ht 74.0 in | Wt 172.5 lb

## 2020-12-20 DIAGNOSIS — M79604 Pain in right leg: Secondary | ICD-10-CM | POA: Diagnosis not present

## 2020-12-20 DIAGNOSIS — R2689 Other abnormalities of gait and mobility: Secondary | ICD-10-CM | POA: Diagnosis not present

## 2020-12-20 DIAGNOSIS — G629 Polyneuropathy, unspecified: Secondary | ICD-10-CM | POA: Diagnosis not present

## 2020-12-20 DIAGNOSIS — M79605 Pain in left leg: Secondary | ICD-10-CM

## 2020-12-20 MED ORDER — GABAPENTIN 300 MG PO CAPS: 300.0000 mg | ORAL_CAPSULE | Freq: Three times a day (TID) | ORAL | 3 refills | Status: DC

## 2020-12-20 NOTE — Telephone Encounter (Signed)
Faxed completed/signed [employer] form to [phone]. Received fax confirmation. Phone: [phone].

## 2020-12-20 NOTE — Progress Notes (Addendum)
Chief Complaint  Patient presents with   Follow-up    Rm 2, alone. Pt here today for intermittent pn, burning, and swelling in left leg, walking aggravates it. Pt c/o of nerve burning pn on right side of neck, had nerve block injection, beginning of the this month and has a pending MRI to be sched. Pt states he has been taking his gabapentin diffrently then prescribed, he takes 3 tab at bedtime.      HISTORY OF PRESENT ILLNESS: 12/20/20 ALL:  [name] is a 75 y.o. male here today for follow up for bilateral leg pain. NCS/EMG testing shows chronic neuropathy. He was started on gabapentin 133m daily and advised to titrate to TID dosing. He has taken 3067mdaily at bedtime. He is uncertain if medicaiton as been helpful. He states that he was previously on gabapentin 30031mID and that seemed to work better. Burning pain continues to be fairly constant. Worse with activity and at night. He continues to [facility] for nerve blocks and ESI. He is beings scheduled for an MRI of cervical spine in the next couple of week. He has severe arthritis of right foot. He was seen by podiatry and told it would take multiple surgeries over 5-6 years. He is on prednisone. He has had a total of 43 surgeries. He is currently being considered for insertion of Watchman.   HISTORY (copied from Dr AthGuadelupe [name] note)  Levi [name] a 74 65ar old right-handed gentleman with an underlying  Complex medical history of asthma, A. fib, hyperlipidemia, reflux disease with hiatal hernia, diverticulitis, degenerative disc disease, COPD, allergic rhinitis, history of agent orange exposure in the 70s, carpal tunnel syndrome, status post bilateral carpal tunnel release and ulnar release surgeries in January and February of this year, status post multiple surgeries including lumbar spine surgery, neck surgery, bilateral hip replacements, bilateral knee replacements, shoulder surgeries, who Presents for follow-up  consultation of his neuropathy.  I saw him at the request of Dr. ElsEllene Route 10/21/2019, at which time the patient reported a longstanding history of numbness and tingling and pain in both lower extremities.  Symptoms were more on the right.   He had extensive blood work on 10/21/2019.  A1c was 5.5, ANA was negative, CRP was less than 1, rheumatoid factor was less than 10, CK was 59, heavy metals in the blood was negative, CMP showed glucose of 115, BUN 27, creatinine 0.88, AST 19, ALT 15, TSH was mildly elevated at 5.33, multiple myeloma panel showed decreased in immunoglobulins with a pattern suggestive of hypogammaglobulinemia, RPR nonreactive, vitamin B12 low at 224, thiamine 206.2.  He was advised to seek consultation with hematology.  He was furthermore advised to follow-up with his primary care for B12 deficiency and abnormal TSH.   He had an EMG and nerve conduction velocity test through our office on 11/17/2019 and I reviewed the results:   Conclusion: This is an abnormal study.  There is electrodiagnostic evidence of mild bilateral lumbosacral radiculopathy, mainly involving bilateral L4, L5, S1 myotomes, there is no evidence of active process.  In addition, there is evidence of mild length dependent axonal sensorimotor polyneuropathy.   We called him with his test results.  He saw hematology in July 2021, hypogammaglobulinemia was not considered to be significant at the time.  He did not have any history of recurrent infections.  He was not felt to need a bone marrow biopsy.   Today, 09/20/2020: He reports ongoing issues with leg  pain, right more than left.  It has not really progressed since last time but he has additional joint pains including both hips, sometimes he takes Flexeril at bedtime.  He does report that his balance is off and he has had falls, as recent as 2 days ago.  He fell in the yard.  He has a walker but typically only uses his four-point cane.  He tries to hydrate with water.  He  reports a recent brief episode of A. fib, he reports that he can tell when he is in A. fib.  He is due for an eye exam.  He had a recent episode of dizziness and lightheadedness and had double vision which lasted about 10 minutes.  He has an eye examination yearly. He had an interim ER visit on 09/13/2020 for dizziness, lightheadedness.  I reviewed the emergency room records.  He had a head CT without contrast on 09/13/2020 and I reviewed the results: IMPRESSION: Mild atrophic changes without acute abnormality.  He was felt to have orthostasis.   He had recent right hand surgery on 08/07/2020 under Dr. [name].  He had right hand ligament reconstruction and tendon interposition.  Pain is improved and mobility is better since surgery he reports.  He is not taking any oxycodone at this time.  Does take Flexeril as needed, typically only once at bedtime.   REVIEW OF SYSTEMS: Out of a complete 14 system review of symptoms, the patient complains only of the following symptoms, chronic pain, numbness/tingling, and all other reviewed systems are negative.   ALLERGIES: Allergies  Allergen Reactions   Morphine And Related Palpitations and Other (See Comments)    Made patient light-headed and sweaty; "my heart races" (04/28/2012), pt states too much morphine gave him this reaction   Levaquin [Levofloxacin] Itching   Fluticasone-Salmeterol Other (See Comments)    Patient somewhat disputes this (??)     HOME MEDICATIONS: Outpatient Medications Prior to Visit  Medication Sig Dispense Refill   albuterol (VENTOLIN HFA) 108 (90 Base) MCG/ACT inhaler INHALE 2 PUFFS INTO THE LUNGS EVERY 4 (FOUR) HOURS AS NEEDED FOR WHEEZING OR SHORTNESS OF BREATH. 18 each 12   allopurinol (ZYLOPRIM) 100 MG tablet Take 100 mg by mouth daily.     apixaban (ELIQUIS) 5 MG TABS tablet Take 1 tablet (5 mg total) by mouth 2 (two) times daily. 60 tablet 6   atorvastatin (LIPITOR) 20 MG tablet TAKE 1 TABLET BY MOUTH EVERY DAY 90  tablet 3   candesartan (ATACAND) 4 MG tablet TAKE 1/2 TABLETS (2 MG TOTAL) BY MOUTH DAILY. 45 tablet 3   cyclobenzaprine (FLEXERIL) 10 MG tablet Take 10 mg by mouth at bedtime as needed for muscle spasms.     diltiazem (CARDIZEM CD) 180 MG 24 hr capsule TAKE 1 CAPSULE BY MOUTH EVERY DAY 90 capsule 1   diltiazem (CARDIZEM) 30 MG tablet Take 30 mg by mouth as needed.     fluconazole (DIFLUCAN) 100 MG tablet Take 2 tablets (200 mg total) by mouth on day 1, then take 1 tablet (100 mg total) by mouth daily for 3 weeks 23 tablet 0   furosemide (LASIX) 20 MG tablet Take 1 tablet (20 mg total) by mouth daily as needed. For shortness of breath and lower limb edema 90 tablet 3   ipratropium-albuterol (DUONEB) 0.5-2.5 (3) MG/3ML SOLN 1 NEBULE EVERY 6 HOURS AS NEEDED. **J45.3** 270 mL 4   isosorbide mononitrate (IMDUR) 30 MG 24 hr tablet TAKE 1 TABLET (30 MG  TOTAL) BY MOUTH DAILY. 30 tablet 11   Nebulizers (COMPRESSOR/NEBULIZER) MISC Use as directed 1 each 0   omeprazole (PRILOSEC) 40 MG capsule Take 1 capsule (40 mg total) by mouth 2 (two) times daily before a meal. 60 capsule 11   predniSONE (DELTASONE) 10 MG tablet TAKE 2 TABLETS (20 MG TOTAL) BY MOUTH DAILY. CONTINUOUS 30 tablet 5   STRIVERDI RESPIMAT 2.5 MCG/ACT AERS INHALE 2 PUFFS BY MOUTH INTO THE LUNGS DAILY 1 each 2   tamsulosin (FLOMAX) 0.4 MG CAPS capsule Take 0.4 mg by mouth at bedtime.   11   gabapentin (NEURONTIN) 100 MG capsule Take 1 capsule (100 mg total) by mouth 3 (three) times daily. Follow titration instructions provided separately. 90 capsule 3   No facility-administered medications prior to visit.     PAST MEDICAL HISTORY: Past Medical History:  Diagnosis Date   Agent orange exposure 1970's   takes Imdur,Diltiazem, and Atacand daily   ALLERGIC RHINITIS    Allergy    seasonal allergies   Asthma    uses inhaler and nebulizers   Bruises easily    d/t prednisone daily   Cataract    CHF (congestive heart failure) ([place])     dx-on meds to treat   Chronic bronchitis ([place])    "used to get it q yr; last time ?2008" (04/28/2012)   COPD (chronic obstructive pulmonary disease) ([facility])    agent orange exposure   Degenerative disk disease    "everywhere" (04/28/2012)   Diverticulitis    Dysrhythmia    afib   Elevated uric acid in blood    takes Allopurinol daily   Emphysema of lung ([place])    Enlarged prostate    but not on any meds   GERD (gastroesophageal reflux disease)    takes Protonix daily   H/O hiatal hernia    Headache    History of pneumonia    a. 2010   Hyperlipidemia    takes Lipitor daily; pt. states he takes a preventive   Joint pain    Joint swelling    Neuromuscular disorder ([place])    bilat legs, and bilat arms   PAF (paroxysmal atrial fibrillation) ([place])    a. Dx 04/2012, CHA2DS2VASc = 1 (age);  b. 04/2012 Echo: EF 40-50%, mild MR.   Personal history of colonic adenomas 02/09/2013   Shortness of breath dyspnea    Thyroid disease    no taking meds-caused dizziness     PAST SURGICAL HISTORY: Past Surgical History:  Procedure Laterality Date   ANTERIOR CERVICAL DECOMP/DISCECTOMY FUSION N/A 07/21/2012   Procedure: ANTERIOR CERVICAL DECOMPRESSION/DISCECTOMY FUSION 2 LEVELS;  Surgeon: [name], MD;  Location: [facility] [facility];  Service: Neurosurgery;  Laterality: N/A;  C4-5 C5-6 Anterior cervical decompression/diskectomy/fusion   ANTERIOR LAT LUMBAR FUSION N/A 04/07/2017   Procedure: Thoracic twelve-Lumbar one Anterolateral decompression/fusion;  Surgeon: [name], MD;  Location: [place];  Service: Neurosurgery;  Laterality: N/A;   BACK SURGERY     x 3   CARDIAC CATHETERIZATION  11/2008   [name] - 20% calcified non flow limiting left main, 50% EF apical hypokinesis   CERVICAL DISC ARTHROPLASTY N/A 04/07/2017   Procedure: Cervical six-seven Disc arthroplasty;  Surgeon: [name], MD;  Location: [facility];  Service: Neurosurgery;  Laterality: N/A;   CHEST TUBE INSERTION Left 04/11/2017    Procedure: CHEST TUBE INSERTION;  Surgeon: [name], MD;  Location: [place];  Service: Thoracic;  Laterality: Left;   CHOLECYSTECTOMY  COLONOSCOPY  2016   CG-MAC-miralax(good)servere TICS/TA x 2   ESOPHAGOGASTRODUODENOSCOPY     EYE SURGERY Bilateral 2011   steroidal encapsulation" (04/28/2012)   FOOT SURGERY Right    x 2   KNEE ARTHROSCOPY Bilateral    LATERAL / POSTERIOR COMBINED FUSION LUMBAR SPINE  2012   POLYPECTOMY  2016   severe TICS/TA x 2   REVERSE SHOULDER ARTHROPLASTY  04/28/2012   Procedure: REVERSE SHOULDER ARTHROPLASTY;  Surgeon: [name], MD;  Location: [facility];  Service: Orthopedics;  Laterality: Left;  Left shouder reverse total shoulder arthroplasty   SHOULDER SURGERY     TENDON REPAIR Right 08/07/2020   Procedure: RIGHT HAND LIGAMENT RECONSTRUCTION AND TENDON INTERPOSITION;  Surgeon: [name], MD;  Location: [place];  Service: Orthopedics;  Laterality: Right;   [place]   TOTAL HIP ARTHROPLASTY Left 2011   "left" (04/28/2012)   TOTAL HIP ARTHROPLASTY Right 07/31/2015   Procedure: TOTAL HIP ARTHROPLASTY ANTERIOR APPROACH;  Surgeon: [name], MD;  Location: [place];  Service: Orthopedics;  Laterality: Right;   TOTAL KNEE ARTHROPLASTY  01/10/2012   Procedure: TOTAL KNEE ARTHROPLASTY;  Surgeon: [name], MD;  Location: [place];  Service: Orthopedics;;  left total knee arthroplasty   TOTAL KNEE ARTHROPLASTY Right 06/17/2014   Procedure: TOTAL KNEE ARTHROPLASTY;  Surgeon: [name], MD;  Location: [facility] [place];  Service: Orthopedics;  Laterality: Right;     FAMILY HISTORY: Family History  Problem Relation Age of Onset   Heart disease Father        Died 74, MI   Diabetes Mother    Prostate cancer Brother    Colon polyps Brother    Colon polyps Brother    Colon cancer Other    Esophageal cancer Neg Hx    Rectal cancer Neg Hx    Stomach cancer Neg Hx    Pancreatic cancer Neg Hx    Kidney disease Neg  Hx    Liver disease Neg Hx      SOCIAL HISTORY: Social History   Socioeconomic History   Marital status: Married    Spouse name: Not on file   Number of children: 1   Years of education: Not on file   Highest education level: Not on file  Occupational History   Occupation: Retired    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   Generalized: Well developed, in no acute distress  Cardiology: normal rate and rhythm, no murmur auscultated  Respiratory: clear to auscultation bilaterally    Neurological examination  Mentation: Alert oriented to time, place, history taking. Follows all commands speech and language fluent Cranial nerve II-XII: Pupils were equal round reactive to light. Extraocular movements were full, visual field were full on confrontational test. Facial sensation and strength were normal. Uvula tongue midline. Head turning and shoulder shrug  were normal and symmetric. Motor: The motor testing reveals 4-4+ over 5 strength of all 4  extremities with exception of R lower hip flexion and dorsiflexion 4/5.  Sensory: Sensory testing is decreased to soft and pinprick testing of bilateral lower extremities Coordination: Cerebellar testing reveals good finger-nose-finger, unable to perform heel to shin bilaterally Gait and station: He pushes with some difficulty to standing position. Gait is slow and cautious. Tandem not attempted.  Reflexes: Deep tendon reflexes are symmetric and normal bilaterally.    DIAGNOSTIC DATA (LABS, IMAGING, TESTING) - I reviewed patient records, labs, notes, testing and imaging myself where available.  Lab Results  Component Value Date   WBC 8.7 09/13/2020   HGB 13.5 09/13/2020   HCT 41.1 09/13/2020   MCV 98.6 09/13/2020   PLT 182 09/13/2020      Component Value Date/Time   NA 136 09/13/2020 1731   NA 140 10/21/2019 0941   K 4.0 09/13/2020 1731   CL 102 09/13/2020 1731   CO2 26 09/13/2020 1731   GLUCOSE 113 (H) 09/13/2020 1731   BUN 22 09/13/2020 1731   BUN 24 10/21/2019 0941   CREATININE 0.92 09/13/2020 1731   CREATININE 0.90 02/28/2020 1440   CALCIUM 9.3 09/13/2020 1731   PROT 5.8 (L) 02/28/2020 1440   PROT 5.9 (L) 10/21/2019 0941   ALBUMIN 3.9 02/28/2020 1440   ALBUMIN 4.1 10/21/2019 0941   AST 19 02/28/2020 1440   ALT 15 02/28/2020 1440   ALKPHOS 53 02/28/2020 1440   BILITOT 0.6 02/28/2020 1440   GFRNONAA >60 09/13/2020 1731   GFRNONAA >60 02/28/2020 1440   GFRAA >60 02/28/2020 1440   Lab Results  Component Value Date   CHOL 188 09/28/2019   HDL 109 09/28/2019   LDLCALC 66 09/28/2019   TRIG 69 09/28/2019   CHOLHDL 1.7 09/28/2019   Lab Results  Component Value Date   HGBA1C 5.5 10/21/2019   Lab Results  Component Value Date   VITAMINB12 224 (L) 10/21/2019   Lab Results  Component Value Date   TSH 5.330 (H) 10/21/2019    No flowsheet data found.   No flowsheet data found.   ASSESSMENT AND PLAN  75 y.o. year old male  has a past medical history of  Agent orange exposure (1970's), ALLERGIC RHINITIS, Allergy, Asthma, Bruises easily, Cataract, CHF (congestive heart failure) ([facility]), Chronic bronchitis ([facility]), COPD (chronic obstructive pulmonary disease) ([place]), Degenerative disk disease, Diverticulitis, Dysrhythmia, Elevated uric acid in blood, Emphysema of lung ([place]), Enlarged prostate, GERD (gastroesophageal reflux disease), H/O hiatal hernia, Headache, History of pneumonia, Hyperlipidemia, Joint pain, Joint swelling, Neuromuscular disorder ([place]), PAF (paroxysmal atrial fibrillation) ([place]), Personal history of colonic adenomas (02/09/2013), Shortness of breath dyspnea, and Thyroid disease. here with    Sensorimotor neuropathy  Leg pain, bilateral  Multifactorial gait disorder  Levi [name] has tolerated gabapentin333m at bedtime but unsure if this has been effective. He was previously taking 3048mTID with Dr ElEllene Routend felt this dose did seem to help. We will increase his dose to  313m twice daily for 3-4 week then increase to 3028mthree times daily. I will also send prescription to [facility] for a compounded neuropathy cream to try at night. He was advised of possible side effects and fall precautions advised. He will continue close follow up with PCP, cardiology and neurosurgery. I will have him returns for follow up with me in 3-6 months. He verbalizes understanding and agreement with this plan.    No orders of the defined types were placed in this encounter.    Meds ordered this encounter  Medications   gabapentin (NEURONTIN) 300 MG capsule    Sig: Take 1 capsule (300 mg total) by mouth 3 (three) times daily.    Dispense:  270 capsule    Refill:  3    Order Specific Question:   Supervising Provider    Answer:   AHMelvenia Beam1[[id number]]     AmDebbora PrestoMSN, FNP-C 12/20/2020, 8:21 AM  [facility] [id number] [phone] reviewed the above note and  documentation by the Nurse Practitioner and agree with the history, exam, assessment and plan as outlined above. I was available for consultation. SaStar AgeMD, PhD [facility] ([facility]

## 2020-12-20 NOTE — Patient Instructions (Signed)
Below is our plan:  We will increase gabapentin dose. I have changed the strength of the capsule to '300mg'$  and want you to increase dose to '300mg'$  (1 capsule) twice daily for 3-4 weeks. If you are tolerating this dose, you may increase to '300mg'$  three times daily. Please be very cautious as this medication can cause sedative type side effects and could increase your risk for falls. Please call me with any concerns. Please use your cane at all times to help prevent falls.   Please make sure you are staying well hydrated. I recommend 50-60 ounces daily. Well balanced diet and regular exercise encouraged. Consistent sleep schedule with 6-8 hours recommended.   Please continue follow up with care team as directed.   Follow up with me in 3-6 months   You may receive a survey regarding today's visit. I encourage you to leave honest feed back as I do use this information to improve patient care. Thank you for seeing me today!

## 2020-12-24 ENCOUNTER — Ambulatory Visit
Admission: RE | Admit: 2020-12-24 | Discharge: 2020-12-24 | Disposition: A | Discharge status: Home / Self Care | Payer: Medicare Other | Source: Ambulatory Visit | Attending: Neurological Surgery | Admitting: Neurological Surgery

## 2020-12-24 ENCOUNTER — Other Ambulatory Visit: Payer: Self-pay

## 2020-12-24 DIAGNOSIS — M4802 Spinal stenosis, cervical region: Secondary | ICD-10-CM | POA: Diagnosis not present

## 2020-12-24 DIAGNOSIS — M4722 Other spondylosis with radiculopathy, cervical region: Secondary | ICD-10-CM

## 2020-12-25 ENCOUNTER — Telehealth: Payer: Self-pay

## 2020-12-25 ENCOUNTER — Ambulatory Visit (INDEPENDENT_AMBULATORY_CARE_PROVIDER_SITE_OTHER): Payer: Medicare Other | Admitting: Gastroenterology

## 2020-12-25 ENCOUNTER — Encounter: Payer: Self-pay | Admitting: Gastroenterology

## 2020-12-25 VITALS — BP 110/78 | HR 90 | Ht 74.0 in | Wt 180.0 lb

## 2020-12-25 DIAGNOSIS — Z86010 Personal history of colonic polyps: Secondary | ICD-10-CM | POA: Diagnosis not present

## 2020-12-25 DIAGNOSIS — Z8709 Personal history of other diseases of the respiratory system: Secondary | ICD-10-CM | POA: Diagnosis not present

## 2020-12-25 DIAGNOSIS — Z7901 Long term (current) use of anticoagulants: Secondary | ICD-10-CM

## 2020-12-25 DIAGNOSIS — B3781 Candidal esophagitis: Secondary | ICD-10-CM | POA: Diagnosis not present

## 2020-12-25 DIAGNOSIS — R1310 Dysphagia, unspecified: Secondary | ICD-10-CM

## 2020-12-25 DIAGNOSIS — K219 Gastro-esophageal reflux disease without esophagitis: Secondary | ICD-10-CM | POA: Diagnosis not present

## 2020-12-25 DIAGNOSIS — R1013 Epigastric pain: Secondary | ICD-10-CM | POA: Diagnosis not present

## 2020-12-25 DIAGNOSIS — I480 Paroxysmal atrial fibrillation: Secondary | ICD-10-CM

## 2020-12-25 DIAGNOSIS — K222 Esophageal obstruction: Secondary | ICD-10-CM

## 2020-12-25 NOTE — Telephone Encounter (Signed)
Patient with diagnosis of afib on Eliquis for anticoagulation.    Procedure: EGD Date of procedure: 01/11/21  CHA2DS2-VASc Score = 2  This indicates a 2.2% annual risk of stroke. The patient's score is based upon: CHF History: Yes HTN History: No Diabetes History: No Stroke History: No Vascular Disease History: No Age Score: 1 Gender Score: 0    CrCl 82.5 ml/min  Per office protocol, patient can hold Eliquis for 2 days prior to procedure.

## 2020-12-25 NOTE — Progress Notes (Signed)
Chief Complaint:    Epigastric pain, odynophagia  GI History: Levi [name] is a 75 y.o. male with history of atrial fibrillation (Eliquis, aspirin), COPD, asthma (steroid-dependent, inhalers), osteoarthritis, who follows in the GI clinic for dysphagia 2/2 esophageal candidiasis, benign esophageal stenosis, and GERD.  Longstanding history of GERD with index symptoms of heartburn, regurgitation, waterbrash.  Symptoms can occur throughout the day, and rarely nocturnal symptoms.  Endoscopic Hx: -Colonoscopy in 01/2013 with 3 small tubular adenomas. -Colonoscopy in 01/2015 with 1 small tubular adenoma, with recommendation to repeat in 5 years -EGD (02/2019): Esophageal candidiasis (bxs negative though), benign distal esophageal stenosis (nondilated).  Treated with fluconazole -Colonoscopy (03/2020, Dr. Bryan Lemma): 6 tubular adenomas, pandiverticulosis, internal hemorrhoids.  Fair prep.  Repeat in 1 year - EGD (05/2020, Dr. Bryan Lemma): Benign stenosis at 39 cm dilated with 54Fr Venia Minks with mucosal disruption at 18 cm consistent with proximal stricture, esophageal candidiasis, mild gastritis    HPI:     Patient is a 75 y.o. male presenting to the Gastroenterology Clinic for follow-up.  Initially seen by me on 06/20/2020 for evaluation of dysphagia/odynophagia, along with recurrence of reflux.  Had modified timing of a.m. PPI and evaluated with expedited EGD as outlined above.  Today, presents with recurrence of odynophagia and epigastric pain. Sxs started approx 6 weeks ago, and retrialed another course of fluconazole with minimal improvement and now recurrence.   Does use a nebulizer, Striverdi inhaler, another inhaler, and albuterol (prescribed prn, but uses frequently), but does not always drink water afterwards. Also takes prednisone 10 mg/day for 30+ years.   MRI cervical spine yesterday with ACDF at C4-C7 without residual spinal stenosis, persistent moderate right C5, C6, and C7  foraminal stenosis, severe right C4 foraminal stenosis, upper thoracic spondylosis. Has f/u with Spine Surgery this month with upcoming possible surgery.   Recently started on Eliquis and going for evaluation with Dr. Quentin Ore for [facility] device.   Review of systems:     No chest pain, no SOB, no fevers, no urinary sx   Past Medical History:  Diagnosis Date  . Agent orange exposure 1970's   takes Imdur,Diltiazem, and Atacand daily  . ALLERGIC RHINITIS   . Allergy    seasonal allergies  . Asthma    uses inhaler and nebulizers  . Bruises easily    d/t prednisone daily  . Cataract   . CHF (congestive heart failure) ([place])    dx-on meds to treat  . Chronic bronchitis ([place])    "used to get it q yr; last time ?2008" (04/28/2012)  . COPD (chronic obstructive pulmonary disease) ([facility])    agent orange exposure  . Degenerative disk disease    "everywhere" (04/28/2012)  . Diverticulitis   . Dysrhythmia    afib  . Elevated uric acid in blood    takes Allopurinol daily  . Emphysema of lung ([place])   . Enlarged prostate    but not on any meds  . GERD (gastroesophageal reflux disease)    takes Protonix daily  . H/O hiatal hernia   . Headache   . History of pneumonia    a. 2010  . Hyperlipidemia    takes Lipitor daily; pt. states he takes a preventive  . Joint pain   . Joint swelling   . Neuromuscular disorder ([facility])    bilat legs, and bilat arms  . PAF (paroxysmal atrial fibrillation) ([place])    a. Dx 04/2012, CHA2DS2VASc = 1 (age);  b. 04/2012 Echo: EF 40-50%,  mild MR.  . Personal history of colonic adenomas 02/09/2013  . Shortness of breath dyspnea   . Thyroid disease    no taking meds-caused dizziness    Patient's surgical history, family medical history, social history, medications and allergies were all reviewed in Epic    Current Outpatient Medications  Medication Sig Dispense Refill  . albuterol (VENTOLIN HFA) 108 (90 Base) MCG/ACT inhaler INHALE 2 PUFFS INTO THE LUNGS EVERY  4 (FOUR) HOURS AS NEEDED FOR WHEEZING OR SHORTNESS OF BREATH. 18 each 12  . allopurinol (ZYLOPRIM) 100 MG tablet Take 100 mg by mouth daily.    [name] apixaban (ELIQUIS) 5 MG TABS tablet Take 1 tablet (5 mg total) by mouth 2 (two) times daily. 60 tablet 6  . atorvastatin (LIPITOR) 20 MG tablet TAKE 1 TABLET BY MOUTH EVERY DAY 90 tablet 3  . candesartan (ATACAND) 4 MG tablet TAKE 1/2 TABLETS (2 MG TOTAL) BY MOUTH DAILY. 45 tablet 3  . cyclobenzaprine (FLEXERIL) 10 MG tablet Take 10 mg by mouth at bedtime as needed for muscle spasms.    [name] diltiazem (CARDIZEM CD) 180 MG 24 hr capsule TAKE 1 CAPSULE BY MOUTH EVERY DAY 90 capsule 1  . diltiazem (CARDIZEM) 30 MG tablet Take 30 mg by mouth as needed.    . fluconazole (DIFLUCAN) 100 MG tablet Take 2 tablets (200 mg total) by mouth on day 1, then take 1 tablet (100 mg total) by mouth daily for 3 weeks 23 tablet 0  . furosemide (LASIX) 20 MG tablet Take 1 tablet (20 mg total) by mouth daily as needed. For shortness of breath and lower limb edema 90 tablet 3  . gabapentin (NEURONTIN) 300 MG capsule Take 1 capsule (300 mg total) by mouth 3 (three) times daily. 270 capsule 3  . ipratropium-albuterol (DUONEB) 0.5-2.5 (3) MG/3ML SOLN 1 NEBULE EVERY 6 HOURS AS NEEDED. **J45.3** 270 mL 4  . isosorbide mononitrate (IMDUR) 30 MG 24 hr tablet TAKE 1 TABLET (30 MG TOTAL) BY MOUTH DAILY. 30 tablet 11  . Nebulizers (COMPRESSOR/NEBULIZER) MISC Use as directed 1 each 0  . NONFORMULARY OR COMPOUNDED ITEM Apply 1-2 g topically. Compounded cream for neuropathy: amantadine 8%, baclofen 2%, gabapentin 6%, amitriptyline 4%, bupivacaine 2%, clonidine 0.2% Directions: Apply 3-4 times daily. Order sent to [facility]    . omeprazole (PRILOSEC) 40 MG capsule Take 1 capsule (40 mg total) by mouth 2 (two) times daily before a meal. 60 capsule 11  . predniSONE (DELTASONE) 10 MG tablet TAKE 2 TABLETS (20 MG TOTAL) BY MOUTH DAILY. CONTINUOUS 30 tablet 5  . STRIVERDI RESPIMAT 2.5  MCG/ACT AERS INHALE 2 PUFFS BY MOUTH INTO THE LUNGS DAILY 1 each 2  . tamsulosin (FLOMAX) 0.4 MG CAPS capsule Take 0.4 mg by mouth at bedtime.   11   No current facility-administered medications for this visit.    Physical Exam:     BP 110/78   Pulse 90   Ht '6\' 2"'$  (1.88 m)   Wt 180 lb (81.6 kg)   SpO2 97%   BMI 23.11 kg/m   GENERAL:  Pleasant male in NAD PSYCH: : Cooperative, normal affect ENEURO: Alert and oriented x 3, no focal neurologic deficits   IMPRESSION and PLAN:    1) Odynophagia 2) Epigastric pain 3) History of esophageal candidiasis - Plan for repeat EGD with possible repeat esophageal dilation with biopsies as appropriate - Educated patient on drinking fluids after using inhalers and nebulizers  4) COPD 5) Chronic steroids - As  above, needs to drink fluids after inhalers/nebulizers - Discussed etiologies for recurrent esophageal candidiasis, with inhalers, steroids placing him at elevated risk  6) History of colon polyps -Plan for repeat colonoscopy in 2022 given fair prep and tubular adenomas x6 in 05/2019.  We again discussed colonoscopy today, and he would prefer to approach this after treating acute symptoms as above  7) Atrial fibrillation 8) Chronic anticoagulation -Hold Eliquis 2 days before procedure - will instruct when and how to resume after procedure. Low but real risk of cardiovascular event such as heart attack, stroke, embolism, thrombosis or ischemia/infarct of other organs off Eliquis explained and need to seek urgent help if this occurs. The patient consents to proceed. Will communicate by phone or EMR with patient's prescribing provider to confirm that holding Eliquis is reasonable in this case  9) GERD - Reflux symptoms otherwise well controlled on current therapy - Can certainly evaluate for erosive esophagitis at time of EGD as above   The indications, risks, and benefits of EGD were explained to the patient in detail. Risks include but  are not limited to bleeding, perforation, adverse reaction to medications, and cardiopulmonary compromise. Sequelae include but are not limited to the possibility of surgery, hospitalization, and mortality. The patient verbalized understanding and wished to proceed. All questions answered, referred to scheduler. Further recommendations pending results of the exam.         Dominic Pea Brynja Marker ,DO, FACG 12/25/2020, 10:26 AM

## 2020-12-25 NOTE — Telephone Encounter (Signed)
Clinical pharmacist to review Eliquis 

## 2020-12-25 NOTE — Patient Instructions (Addendum)
If you are age 74 or older, your body mass index should be between 23-30. Your Body mass index is 23.11 kg/m. If this is out of the aforementioned range listed, please consider follow up with your Primary Care Provider.  If you are age 70 or younger, your body mass index should be between 19-25. Your Body mass index is 23.11 kg/m. If this is out of the aformentioned range listed, please consider follow up with your Primary Care Provider.   __________________________________________________________  The [place] [facility] providers would like to encourage you to use [facility] to communicate with providers for non-urgent requests or questions.  Due to long hold times on the telephone, sending your provider a message by [facility] may be a faster and more efficient way to get a response.  Please allow 48 business hours for a response.  Please remember that this is for non-urgent requests.  ____________________________________________________________  Due to recent changes in healthcare laws, you may see the results of your imaging and laboratory studies on MyChart before your provider has had a chance to review them.  We understand that in some cases there may be results that are confusing or concerning to you. Not all laboratory results come back in the same time frame and the provider may be waiting for multiple results in order to interpret others.  Please give [place] 48 hours in order for your provider to thoroughly review all the results before contacting the office for clarification of your results.   You have been scheduled for an endoscopy. Please follow written instructions given to you at your visit today. If you use inhalers (even only as needed), please bring them with you on the day of your procedure.  You will be contacted by our office prior to your procedure for directions on holding your blood thinner.  If you do not hear from our office 1 week prior to your scheduled procedure, please call [phone]  to discuss.   Thank you for choosing me and [facility].  Vito Cirigliano, D.O.

## 2020-12-25 NOTE — Telephone Encounter (Signed)
[facility] Pre-operative Risk Assessment     Request for surgical clearance:     Endoscopy Procedure  What type of surgery is being performed?     EGD  When is this surgery scheduled?     01-11-2021  What type of clearance is required ?   Pharmacy  Are there any medications that need to be held prior to surgery and how long? Eliquis 2 day hold please   Practice name and name of physician performing surgery?      [facility]  What is your office phone and fax number?      Phone- [phone]  [phone]  Anesthesia type (None, local, MAC, general) ?       MAC

## 2020-12-26 DIAGNOSIS — M4722 Other spondylosis with radiculopathy, cervical region: Secondary | ICD-10-CM | POA: Diagnosis not present

## 2020-12-26 NOTE — Telephone Encounter (Signed)
Please inform patient our clinical pharmacist's recommendation.

## 2020-12-26 NOTE — Telephone Encounter (Signed)
Patient made aware and voiced understanding.

## 2020-12-28 ENCOUNTER — Other Ambulatory Visit: Payer: Self-pay | Admitting: Internal Medicine

## 2020-12-29 DIAGNOSIS — H02831 Dermatochalasis of right upper eyelid: Secondary | ICD-10-CM | POA: Diagnosis not present

## 2020-12-29 DIAGNOSIS — H02834 Dermatochalasis of left upper eyelid: Secondary | ICD-10-CM | POA: Diagnosis not present

## 2020-12-29 DIAGNOSIS — H26493 Other secondary cataract, bilateral: Secondary | ICD-10-CM | POA: Diagnosis not present

## 2020-12-29 DIAGNOSIS — H31091 Other chorioretinal scars, right eye: Secondary | ICD-10-CM | POA: Diagnosis not present

## 2020-12-29 DIAGNOSIS — H0100A Unspecified blepharitis right eye, upper and lower eyelids: Secondary | ICD-10-CM | POA: Diagnosis not present

## 2020-12-29 DIAGNOSIS — H43813 Vitreous degeneration, bilateral: Secondary | ICD-10-CM | POA: Diagnosis not present

## 2020-12-29 DIAGNOSIS — H527 Unspecified disorder of refraction: Secondary | ICD-10-CM | POA: Diagnosis not present

## 2020-12-29 DIAGNOSIS — Z961 Presence of intraocular lens: Secondary | ICD-10-CM | POA: Diagnosis not present

## 2020-12-29 DIAGNOSIS — H0100B Unspecified blepharitis left eye, upper and lower eyelids: Secondary | ICD-10-CM | POA: Diagnosis not present

## 2020-12-29 DIAGNOSIS — H35432 Paving stone degeneration of retina, left eye: Secondary | ICD-10-CM | POA: Diagnosis not present

## 2020-12-29 DIAGNOSIS — H53001 Unspecified amblyopia, right eye: Secondary | ICD-10-CM | POA: Diagnosis not present

## 2021-01-11 ENCOUNTER — Ambulatory Visit (AMBULATORY_SURGERY_CENTER): Payer: Medicare Other | Admitting: Gastroenterology

## 2021-01-11 ENCOUNTER — Other Ambulatory Visit: Payer: Self-pay

## 2021-01-11 ENCOUNTER — Encounter: Payer: Self-pay | Admitting: Gastroenterology

## 2021-01-11 VITALS — BP 116/62 | HR 61 | Temp 97.3°F | Resp 15 | Ht 74.0 in | Wt 180.0 lb

## 2021-01-11 DIAGNOSIS — K219 Gastro-esophageal reflux disease without esophagitis: Secondary | ICD-10-CM

## 2021-01-11 DIAGNOSIS — R1310 Dysphagia, unspecified: Secondary | ICD-10-CM

## 2021-01-11 DIAGNOSIS — I509 Heart failure, unspecified: Secondary | ICD-10-CM | POA: Diagnosis not present

## 2021-01-11 DIAGNOSIS — J449 Chronic obstructive pulmonary disease, unspecified: Secondary | ICD-10-CM | POA: Diagnosis not present

## 2021-01-11 DIAGNOSIS — K2970 Gastritis, unspecified, without bleeding: Secondary | ICD-10-CM

## 2021-01-11 DIAGNOSIS — K317 Polyp of stomach and duodenum: Secondary | ICD-10-CM

## 2021-01-11 DIAGNOSIS — K3189 Other diseases of stomach and duodenum: Secondary | ICD-10-CM | POA: Diagnosis not present

## 2021-01-11 MED ORDER — SODIUM CHLORIDE 0.9 % IV SOLN: 500.0000 mL | Freq: Once | INTRAVENOUS | Status: DC

## 2021-01-11 NOTE — Addendum Note (Signed)
Addended by: Evonnie Pat A on: 01/11/2021 12:37 PM   Modules accepted: Orders

## 2021-01-11 NOTE — Patient Instructions (Signed)
Discharge instructions given. Handout on Gastritis. Use omeprazole '20mg'$  by mouth twice a day for 6 weeks to promote mucosal healing, then can discontinue.if symptoms resolve. Resume previous medications. YOU HAD AN ENDOSCOPIC PROCEDURE TODAY AT [facility]:   Refer to the procedure report that was given to you for any specific questions about what was found during the examination.  If the procedure report does not answer your questions, please call your gastroenterologist to clarify.  If you requested that your care partner not be given the details of your procedure findings, then the procedure report has been included in a sealed envelope for you to review at your convenience later.  YOU SHOULD EXPECT: Some feelings of bloating in the abdomen. Passage of more gas than usual.  Walking can help get rid of the air that was put into your GI tract during the procedure and reduce the bloating. If you had a lower endoscopy (such as a colonoscopy or flexible sigmoidoscopy) you may notice spotting of blood in your stool or on the toilet paper. If you underwent a bowel prep for your procedure, you may not have a normal bowel movement for a few days.  Please Note:  You might notice some irritation and congestion in your nose or some drainage.  This is from the oxygen used during your procedure.  There is no need for concern and it should clear up in a day or so.  SYMPTOMS TO REPORT IMMEDIATELY:  Following upper endoscopy (EGD)  Vomiting of blood or coffee ground material  New chest pain or pain under the shoulder blades  Painful or persistently difficult swallowing  New shortness of breath  Fever of 100F or higher  Black, tarry-looking stools  For urgent or emergent issues, a gastroenterologist can be reached at any hour by calling [phone]. Do not use MyChart messaging for urgent concerns.    DIET:  We do recommend a small meal at first, but then you may proceed to your regular  diet.  Drink plenty of fluids but you should avoid alcoholic beverages for 24 hours.  ACTIVITY:  You should plan to take it easy for the rest of today and you should NOT DRIVE or use heavy machinery until tomorrow (because of the sedation medicines used during the test).    FOLLOW UP: Our staff will call the number listed on your records 48-72 hours following your procedure to check on you and address any questions or concerns that you may have regarding the information given to you following your procedure. If we do not reach you, we will leave a message.  We will attempt to reach you two times.  During this call, we will ask if you have developed any symptoms of COVID 19. If you develop any symptoms (ie: fever, flu-like symptoms, shortness of breath, cough etc.) before then, please call [phone].  If you test positive for Covid 19 in the 2 weeks post procedure, please call and report this information to [place].    If any biopsies were taken you will be contacted by phone or by letter within the next 1-3 weeks.  Please call us at [phone] if you have not heard about the biopsies in 3 weeks.    SIGNATURES/CONFIDENTIALITY: You and/or your care partner have signed paperwork which will be entered into your electronic medical record.  These signatures attest to the fact that that the information above on your After Visit Summary has been reviewed and is understood.  Full  responsibility of the confidentiality of this discharge information lies with you and/or your care-partner.  

## 2021-01-11 NOTE — Progress Notes (Signed)
Called to room to assist during endoscopic procedure.  Patient ID and intended procedure confirmed with present staff. Received instructions for my participation in the procedure from the performing physician.  

## 2021-01-11 NOTE — Progress Notes (Signed)
GASTROENTEROLOGY PROCEDURE H&P NOTE   Primary Care Physician: Shon Baton, MD    Reason for Procedure:   Odynophagia, epigastric pain  Plan:    EGD  Patient is appropriate for endoscopic procedure(s) in the ambulatory ([place]) setting.  The nature of the procedure, as well as the risks, benefits, and alternatives were carefully and thoroughly reviewed with the patient. Ample time for discussion and questions allowed. The patient understood, was satisfied, and agreed to proceed.     HPI: Levi [name] is a 75 y.o. male who presents for EGD for evaluation of MEG pain and odynophagia. Patient was last seen by me on 12/25/2020. Please refer to that note for additional details. Otherwise, no changes in medical history since then.   Past Medical History:  Diagnosis Date   Agent orange exposure 1970's   takes Imdur,Diltiazem, and Atacand daily   ALLERGIC RHINITIS    Allergy    seasonal allergies   Asthma    uses inhaler and nebulizers   Bruises easily    d/t prednisone daily   Cataract    CHF (congestive heart failure) (HCC)    dx-on meds to treat   Chronic bronchitis ([place])    "used to get it q yr; last time ?2008" (04/28/2012)   COPD (chronic obstructive pulmonary disease) (HCC)    agent orange exposure   Degenerative disk disease    "everywhere" (04/28/2012)   Diverticulitis    Dysrhythmia    afib   Elevated uric acid in blood    takes Allopurinol daily   Emphysema of lung ([place])    Enlarged prostate    but not on any meds   GERD (gastroesophageal reflux disease)    takes Protonix daily   H/O hiatal hernia    Headache    History of pneumonia    a. 2010   Hyperlipidemia    takes Lipitor daily; pt. states he takes a preventive   Joint pain    Joint swelling    Neuromuscular disorder ([place])    bilat legs, and bilat arms   PAF (paroxysmal atrial fibrillation) ([place])    a. Dx 04/2012, CHA2DS2VASc = 1 (age);  b. 04/2012 Echo: EF 40-50%, mild MR.   Personal history of  colonic adenomas 02/09/2013   Shortness of breath dyspnea    Thyroid disease    no taking meds-caused dizziness    Past Surgical History:  Procedure Laterality Date   ANTERIOR CERVICAL DECOMP/DISCECTOMY FUSION N/A 07/21/2012   Procedure: ANTERIOR CERVICAL DECOMPRESSION/DISCECTOMY FUSION 2 LEVELS;  Surgeon: [name], MD;  Location: [facility];  Service: Neurosurgery;  Laterality: N/A;  C4-5 C5-6 Anterior cervical decompression/diskectomy/fusion   ANTERIOR LAT LUMBAR FUSION N/A 04/07/2017   Procedure: Thoracic twelve-Lumbar one Anterolateral decompression/fusion;  Surgeon: [name], MD;  Location: [facility];  Service: Neurosurgery;  Laterality: N/A;   BACK SURGERY     x 3   CARDIAC CATHETERIZATION  11/2008   [name] - 20% calcified non flow limiting left main, 50% EF apical hypokinesis   CERVICAL DISC ARTHROPLASTY N/A 04/07/2017   Procedure: Cervical six-seven Disc arthroplasty;  Surgeon: [name], MD;  Location: [facility];  Service: Neurosurgery;  Laterality: N/A;   CHEST TUBE INSERTION Left 04/11/2017   Procedure: CHEST TUBE INSERTION;  Surgeon: [name], MD;  Location: [facility];  Service: Thoracic;  Laterality: Left;   CHOLECYSTECTOMY     COLONOSCOPY  2016   CG-MAC-miralax(good)servere TICS/TA x 2   ESOPHAGOGASTRODUODENOSCOPY  EYE SURGERY Bilateral 2011   steroidal encapsulation" (04/28/2012)   FOOT SURGERY Right    x 2   KNEE ARTHROSCOPY Bilateral    LATERAL / POSTERIOR COMBINED FUSION LUMBAR SPINE  2012   POLYPECTOMY  2016   severe TICS/TA x 2   REVERSE SHOULDER ARTHROPLASTY  04/28/2012   Procedure: REVERSE SHOULDER ARTHROPLASTY;  Surgeon: [name], MD;  Location: [facility];  Service: Orthopedics;  Laterality: Left;  Left shouder reverse total shoulder arthroplasty   SHOULDER SURGERY     TENDON REPAIR Right 08/07/2020   Procedure: RIGHT HAND LIGAMENT RECONSTRUCTION AND TENDON INTERPOSITION;  Surgeon: [name], MD;  Location: [facility];  Service: Orthopedics;  Laterality: Right;   [place]   TOTAL HIP ARTHROPLASTY Left 2011   "left" (04/28/2012)   TOTAL HIP ARTHROPLASTY Right 07/31/2015   Procedure: TOTAL HIP ARTHROPLASTY ANTERIOR APPROACH;  Surgeon: [name], MD;  Location: [facility];  Service: Orthopedics;  Laterality: Right;   TOTAL KNEE ARTHROPLASTY  01/10/2012   Procedure: TOTAL KNEE ARTHROPLASTY;  Surgeon: [name], MD;  Location: [facility];  Service: Orthopedics;;  left total knee arthroplasty   TOTAL KNEE ARTHROPLASTY Right 06/17/2014   Procedure: TOTAL KNEE ARTHROPLASTY;  Surgeon: [name], MD;  Location: [facility];  Service: Orthopedics;  Laterality: Right;    Prior to Admission medications   Medication Sig Start Date End Date Taking? Authorizing Provider  allopurinol (ZYLOPRIM) 100 MG tablet Take 100 mg by mouth daily.   Yes [provider]  atorvastatin (LIPITOR) 20 MG tablet TAKE 1 TABLET BY MOUTH EVERY DAY 05/08/20  Yes Jerline Pain, MD  candesartan (ATACAND) 4 MG tablet TAKE 1/2 TABLETS (2 MG TOTAL) BY MOUTH DAILY. 03/28/20  Yes Jerline Pain, MD  cyclobenzaprine (FLEXERIL) 10 MG tablet Take 10 mg by mouth at bedtime as needed for muscle spasms.   Yes [provider]  diltiazem (CARDIZEM CD) 180 MG 24 hr capsule TAKE 1 CAPSULE BY MOUTH EVERY DAY 08/25/20  Yes Jerline Pain, MD  diltiazem (CARDIZEM) 30 MG tablet Take 30 mg by mouth as needed.   Yes [provider]  fluconazole (DIFLUCAN) 100 MG tablet Take 2 tablets (200 mg total) by mouth on day 1, then take 1 tablet (100 mg total) by mouth daily for 3 weeks 11/29/20  Yes Caytlyn Evers V, DO  furosemide (LASIX) 20 MG tablet Take 1 tablet (20 mg total) by mouth daily as needed. For shortness of breath and lower limb edema 11/17/18  Yes Jerline Pain, MD  gabapentin (NEURONTIN) 300 MG capsule Take 1 capsule (300 mg total) by mouth 3 (three) times daily. 12/20/20  Yes Lomax, Amy, NP   ipratropium-albuterol (DUONEB) 0.5-2.5 (3) MG/3ML SOLN 1 NEBULE EVERY 6 HOURS AS NEEDED. **J45.3** 03/22/20  Yes Young, Tarri Fuller D, MD  isosorbide mononitrate (IMDUR) 30 MG 24 hr tablet TAKE 1 TABLET (30 MG TOTAL) BY MOUTH DAILY. 02/08/20  Yes Jerline Pain, MD  Nebulizers (COMPRESSOR/NEBULIZER) MISC Use as directed 12/01/17  Yes Young, Tarri Fuller D, MD  nystatin (MYCOSTATIN) 100000 UNIT/ML suspension Take by mouth. 12/03/20  Yes [provider]  omeprazole (PRILOSEC) 40 MG capsule Take 1 capsule (40 mg total) by mouth 2 (two) times daily before a meal. 02/11/20  Yes Gatha Mayer, MD  predniSONE (DELTASONE) 10 MG tablet TAKE 2 TABLETS (20 MG TOTAL) BY MOUTH DAILY. CONTINUOUS 01/01/21  Yes Deneise Lever, MD  STRIVERDI RESPIMAT 2.5  MCG/ACT AERS INHALE 2 PUFFS BY MOUTH INTO THE LUNGS DAILY 07/03/20  Yes Young, Tarri Fuller D, MD  tamsulosin (FLOMAX) 0.4 MG CAPS capsule Take 0.4 mg by mouth at bedtime.  10/13/15  Yes [provider]  albuterol (VENTOLIN HFA) 108 (90 Base) MCG/ACT inhaler INHALE 2 PUFFS INTO THE LUNGS EVERY 4 (FOUR) HOURS AS NEEDED FOR WHEEZING OR SHORTNESS OF BREATH. 10/13/20   Deneise Lever, MD  apixaban (ELIQUIS) 5 MG TABS tablet Take 1 tablet (5 mg total) by mouth 2 (two) times daily. 12/05/20   Jerline Pain, MD  NONFORMULARY OR COMPOUNDED ITEM Apply 1-2 g topically. Compounded cream for neuropathy: amantadine 8%, baclofen 2%, gabapentin 6%, amitriptyline 4%, bupivacaine 2%, clonidine 0.2% Directions: Apply 3-4 times daily. Order sent to [facility] Patient not taking: Reported on 01/11/2021    [provider]    Current Outpatient Medications  Medication Sig Dispense Refill   allopurinol (ZYLOPRIM) 100 MG tablet Take 100 mg by mouth daily.     atorvastatin (LIPITOR) 20 MG tablet TAKE 1 TABLET BY MOUTH EVERY DAY 90 tablet 3   candesartan (ATACAND) 4 MG tablet TAKE 1/2 TABLETS (2 MG TOTAL) BY MOUTH DAILY. 45 tablet 3   cyclobenzaprine (FLEXERIL) 10  MG tablet Take 10 mg by mouth at bedtime as needed for muscle spasms.     diltiazem (CARDIZEM CD) 180 MG 24 hr capsule TAKE 1 CAPSULE BY MOUTH EVERY DAY 90 capsule 1   diltiazem (CARDIZEM) 30 MG tablet Take 30 mg by mouth as needed.     fluconazole (DIFLUCAN) 100 MG tablet Take 2 tablets (200 mg total) by mouth on day 1, then take 1 tablet (100 mg total) by mouth daily for 3 weeks 23 tablet 0   furosemide (LASIX) 20 MG tablet Take 1 tablet (20 mg total) by mouth daily as needed. For shortness of breath and lower limb edema 90 tablet 3   gabapentin (NEURONTIN) 300 MG capsule Take 1 capsule (300 mg total) by mouth 3 (three) times daily. 270 capsule 3   ipratropium-albuterol (DUONEB) 0.5-2.5 (3) MG/3ML SOLN 1 NEBULE EVERY 6 HOURS AS NEEDED. **J45.3** 270 mL 4   isosorbide mononitrate (IMDUR) 30 MG 24 hr tablet TAKE 1 TABLET (30 MG TOTAL) BY MOUTH DAILY. 30 tablet 11   Nebulizers (COMPRESSOR/NEBULIZER) MISC Use as directed 1 each 0   nystatin (MYCOSTATIN) 100000 UNIT/ML suspension Take by mouth.     omeprazole (PRILOSEC) 40 MG capsule Take 1 capsule (40 mg total) by mouth 2 (two) times daily before a meal. 60 capsule 11   predniSONE (DELTASONE) 10 MG tablet TAKE 2 TABLETS (20 MG TOTAL) BY MOUTH DAILY. CONTINUOUS 30 tablet 5   STRIVERDI RESPIMAT 2.5 MCG/ACT AERS INHALE 2 PUFFS BY MOUTH INTO THE LUNGS DAILY 1 each 2   tamsulosin (FLOMAX) 0.4 MG CAPS capsule Take 0.4 mg by mouth at bedtime.   11   albuterol (VENTOLIN HFA) 108 (90 Base) MCG/ACT inhaler INHALE 2 PUFFS INTO THE LUNGS EVERY 4 (FOUR) HOURS AS NEEDED FOR WHEEZING OR SHORTNESS OF BREATH. 18 each 12   apixaban (ELIQUIS) 5 MG TABS tablet Take 1 tablet (5 mg total) by mouth 2 (two) times daily. 60 tablet 6   NONFORMULARY OR COMPOUNDED ITEM Apply 1-2 g topically. Compounded cream for neuropathy: amantadine 8%, baclofen 2%, gabapentin 6%, amitriptyline 4%, bupivacaine 2%, clonidine 0.2% Directions: Apply 3-4 times daily. Order sent to [facility] (Patient not taking: Reported on 01/11/2021)     Current Facility-Administered  Medications  Medication Dose Route Frequency Provider Last Rate Last Admin   0.9 %  sodium chloride infusion  500 mL Intravenous Once Venera Privott V, DO        Allergies as of 01/11/2021 - Review Complete 01/11/2021  Allergen Reaction Noted   Morphine and related Palpitations and Other (See Comments) 01/10/2012   Levaquin [levofloxacin] Itching     Family History  Problem Relation Age of Onset   Heart disease Father        Died 74, MI   Diabetes Mother    Prostate cancer Brother    Colon polyps Brother    Colon polyps Brother    Colon cancer Other    Esophageal cancer Neg Hx    Rectal cancer Neg Hx    Stomach cancer Neg Hx    Pancreatic cancer Neg Hx    Kidney disease Neg Hx    Liver disease Neg Hx     Social History   Socioeconomic History   Marital status: Married    Spouse name: Not on file   Number of children: 1   Years of education: Not on file   Highest education level: Not on file  Occupational History   Occupation: Retired    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, Radiographer, therapeutic   Social Determinants of Radio broadcast assistant Strain: Not on file  Food Insecurity: Not on file  Transportation Needs: Not on file  Physical Activity: Not on file  Stress: Not on file  Social Connections: Not on file  Intimate Partner Violence: Not on file    Physical  Exam: Vital signs in last 24 hours: _0  124/77   Pulse 86   Temp (!) 97.3 F (36.3 C) (Skin)   Ht _1  (1.88 m)   Wt 180 lb (81.6 kg)   SpO2 96%   BMI 23.11 kg/m  GEN: NAD EYE: Sclerae anicteric ENT: MMM CV: Non-tachycardic Pulm: CTA b/l GI: Soft, NT/ND NEURO:  Alert & Oriented x 3   Gerrit Heck, DO [facility]   01/11/2021 9:01 AM

## 2021-01-11 NOTE — Progress Notes (Signed)
Pt's states no medical or surgical changes since previsit or office visit. VS assessed by C.W 

## 2021-01-11 NOTE — Progress Notes (Signed)
Per Dr. Bryan Lemma may take omeprazole '40mg'$  twice a day for 6 weeks.

## 2021-01-11 NOTE — Op Note (Signed)
[facility] Patient Name: Levi [name] Procedure Date: 01/11/2021 8:53 AM MRN: [id number] Endoscopist: Gerrit Heck , MD Age: 75 Referring MD:  Date of Birth: 06-10-1945 Gender: Male Account #: [id number] Procedure:                Upper GI endoscopy Indications:              Epigastric abdominal pain, Odynophagia Medicines:                Monitored Anesthesia Care Procedure:                Pre-Anesthesia Assessment:                           - Prior to the procedure, a History and Physical                            was performed, and patient medications and                            allergies were reviewed. The patient's tolerance of                            previous anesthesia was also reviewed. The risks                            and benefits of the procedure and the sedation                            options and risks were discussed with the patient.                            All questions were answered, and informed consent                            was obtained. Prior Anticoagulants: The patient has                            taken Eliquis (apixaban), last dose was 2 days                            prior to procedure. ASA Grade Assessment: III - A                            patient with severe systemic disease. After                            reviewing the risks and benefits, the patient was                            deemed in satisfactory condition to undergo the                            procedure.  After obtaining informed consent, the endoscope was                            passed under direct vision. Throughout the                            procedure, the patient's blood pressure, pulse, and                            oxygen saturations were monitored continuously. The                            GIF [id number] was introduced through the                            mouth, and advanced to the second part of duodenum.                             The upper GI endoscopy was accomplished without                            difficulty. The patient tolerated the procedure                            well. Scope In: Scope Out: Findings:                 The examined esophagus was normal.                           The Z-line was regular and was found 40 cm from the                            incisors.                           Mild inflammation characterized by erythema was                            found in the gastric body and in the gastric                            antrum. Biopsies were taken with a cold forceps for                            Helicobacter pylori testing. Estimated blood loss                            was minimal.                           A single 4 mm sessile polyp with no bleeding and no                            stigmata of recent bleeding was found  in the                            gastric body. The polyp was removed with a cold                            biopsy forceps. Resection and retrieval were                            complete. Estimated blood loss was minimal.                           The examined duodenum was normal. Complications:            No immediate complications. Estimated Blood Loss:     Estimated blood loss was minimal. Impression:               - Normal esophagus.                           - Z-line regular, 40 cm from the incisors.                           - Gastritis. Biopsied.                           - A single gastric polyp. Resected and retrieved.                           - Normal examined duodenum. Recommendation:           - Patient has a contact number available for                            emergencies. The signs and symptoms of potential                            delayed complications were discussed with the                            patient. Return to normal activities tomorrow.                            Written discharge instructions were provided to  the                            patient.                           - Resume previous diet.                           - Continue present medications.                           - Use Prilosec (omeprazole) 20 mg PO BID for 6  weeks to promote mucosal healing, then can                            discontinue if symptoms resolve.                           - Resume Eliquis (apixaban) at prior dose tomorrow.                           - Await pathology results.                           - If esophageal symptoms persist, can consider                            Esophagel Manometry. Gerrit Heck, MD 01/11/2021 9:29:15 AM

## 2021-01-11 NOTE — Progress Notes (Signed)
pt tolerated well. VSS. awake and to recovery. Report given to RN. Bite block left insitu to recovery. No trauma noted.

## 2021-01-14 DIAGNOSIS — Z23 Encounter for immunization: Secondary | ICD-10-CM | POA: Diagnosis not present

## 2021-01-15 ENCOUNTER — Telehealth: Payer: Self-pay

## 2021-01-15 DIAGNOSIS — M4722 Other spondylosis with radiculopathy, cervical region: Secondary | ICD-10-CM | POA: Diagnosis not present

## 2021-01-15 DIAGNOSIS — M542 Cervicalgia: Secondary | ICD-10-CM | POA: Diagnosis not present

## 2021-01-15 NOTE — Telephone Encounter (Signed)
  Follow up Call-  Call back number 01/11/2021 06/23/2020 04/12/2020 03/02/2019  Post procedure Call Back phone  # [phone]  Permission to leave phone message Yes Yes Yes Yes  Some recent data might be hidden     Patient questions:  Do you have a fever, pain , or abdominal swelling? No. Pain Score  0 *  Have you tolerated food without any problems? Yes.    Have you been able to return to your normal activities? Yes.    Do you have any questions about your discharge instructions: Diet   No. Medications  No. Follow up visit  No.  Do you have questions or concerns about your Care? No.  Actions: * If pain score is 4 or above: No action needed, pain <4Have you developed a fever since your procedure? no  2.   Have you had an respiratory symptoms (SOB or cough) since your procedure? no  3.   Have you tested positive for COVID 19 since your procedure no  4.   Have you had any family members/close contacts diagnosed with the COVID 19 since your procedure?  no   If yes to any of these questions please route to Joylene Zakaria, RN and Joella Prince, RN

## 2021-01-18 ENCOUNTER — Encounter: Payer: Self-pay | Admitting: Gastroenterology

## 2021-01-22 DIAGNOSIS — M4722 Other spondylosis with radiculopathy, cervical region: Secondary | ICD-10-CM | POA: Diagnosis not present

## 2021-01-22 DIAGNOSIS — M542 Cervicalgia: Secondary | ICD-10-CM | POA: Diagnosis not present

## 2021-01-23 ENCOUNTER — Encounter: Payer: Self-pay | Admitting: Cardiology

## 2021-01-23 ENCOUNTER — Other Ambulatory Visit: Payer: Self-pay

## 2021-01-23 ENCOUNTER — Ambulatory Visit (INDEPENDENT_AMBULATORY_CARE_PROVIDER_SITE_OTHER): Payer: Medicare Other | Admitting: Cardiology

## 2021-01-23 ENCOUNTER — Encounter: Payer: Self-pay | Admitting: *Deleted

## 2021-01-23 VITALS — BP 122/74 | HR 90 | Ht 74.0 in | Wt 180.0 lb

## 2021-01-23 DIAGNOSIS — J4489 Other specified chronic obstructive pulmonary disease: Secondary | ICD-10-CM

## 2021-01-23 DIAGNOSIS — I5022 Chronic systolic (congestive) heart failure: Secondary | ICD-10-CM | POA: Diagnosis not present

## 2021-01-23 DIAGNOSIS — R296 Repeated falls: Secondary | ICD-10-CM | POA: Diagnosis not present

## 2021-01-23 DIAGNOSIS — J449 Chronic obstructive pulmonary disease, unspecified: Secondary | ICD-10-CM

## 2021-01-23 DIAGNOSIS — I480 Paroxysmal atrial fibrillation: Secondary | ICD-10-CM

## 2021-01-23 LAB — BASIC METABOLIC PANEL
BUN/Creatinine Ratio: 29 — ABNORMAL HIGH (ref 10–24)
BUN: 24 mg/dL (ref 8–27)
CO2: 22 mmol/L (ref 20–29)
Calcium: 9.9 mg/dL (ref 8.6–10.2)
Chloride: 105 mmol/L (ref 96–106)
Creatinine, Ser: 0.84 mg/dL (ref 0.76–1.27)
Glucose: 95 mg/dL (ref 65–99)
Potassium: 3.6 mmol/L (ref 3.5–5.2)
Sodium: 142 mmol/L (ref 134–144)
eGFR: 92 mL/min/{1.73_m2} (ref >59)

## 2021-01-23 MED ORDER — METOPROLOL TARTRATE 25 MG PO TABS: 25.0000 mg | ORAL_TABLET | Freq: Once | ORAL | 0 refills | Status: DC | PRN

## 2021-01-23 NOTE — H&P (View-Only) (Signed)
Electrophysiology Office Note:    Date:  01/23/2021   ID:  Levi [name], DOB 11-18-1945, MRN [id number]  PCP:  Shon Baton, MD  [facility] Cardiologist:  Candee Furbish, MD  [facility] Electrophysiologist:  Vickie Epley, MD   Referring MD: Jerline Pain, MD   Chief Complaint: Atrial fibrillation  History of Present Illness:    Levi [name] is a 75 y.o. male who presents for an evaluation of atrial fibrillation at the request of [name]. Their medical history includes symptomatic paroxysmal atrial fibrillation on Eliquis for stroke prophylaxis.  He also has a history of severe falls because of peripheral neuropathy.  His medical history also includes COPD and mild left ventricular dysfunction.  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  He tells me he has had multiple severe falls in the past with personal injury.  He has fractured a hip.  He uses a four-point cane inconsistently.  He is concerned about the risk of bleeding given his falls and long-term use of anticoagulation.  His atrial fibrillation comes and goes.  He tells me he will have frequent symptoms some weeks and then it will disappear for months at a time.  He is come to the emergency department on several occasions but frequently he will be back in normal rhythm by the time an EKG can be performed.  Past Medical History:  Diagnosis Date   Agent orange exposure 1970's   takes Imdur,Diltiazem, and Atacand daily   ALLERGIC RHINITIS    Allergy    seasonal allergies   Asthma    uses inhaler and nebulizers   Bruises easily    d/t prednisone daily   Cataract    CHF (congestive heart failure) (HCC)    dx-on meds to treat   Chronic bronchitis ([place])    "used to get it q yr; last time ?2008" (04/28/2012)   COPD (chronic obstructive pulmonary disease) (HCC)    agent orange exposure   Degenerative disk disease    "everywhere" (04/28/2012)   Diverticulitis    Dysrhythmia     afib   Elevated uric acid in blood    takes Allopurinol daily   Emphysema of lung ([place])    Enlarged prostate    but not on any meds   GERD (gastroesophageal reflux disease)    takes Protonix daily   H/O hiatal hernia    Headache    History of pneumonia    a. 2010   Hyperlipidemia    takes Lipitor daily; pt. states he takes a preventive   Joint pain    Joint swelling    Neuromuscular disorder ([place])    bilat legs, and bilat arms   PAF (paroxysmal atrial fibrillation) ([place])    a. Dx 04/2012, CHA2DS2VASc = 1 (age);  b. 04/2012 Echo: EF 40-50%, mild MR.   Personal history of colonic adenomas 02/09/2013   Shortness of breath dyspnea    Thyroid disease    no taking meds-caused dizziness    Past Surgical History:  Procedure Laterality Date   ANTERIOR CERVICAL DECOMP/DISCECTOMY FUSION N/A 07/21/2012   Procedure: ANTERIOR CERVICAL DECOMPRESSION/DISCECTOMY FUSION 2 LEVELS;  Surgeon: [name], MD;  Location: [place] [facility];  Service: Neurosurgery;  Laterality: N/A;  C4-5 C5-6 Anterior cervical decompression/diskectomy/fusion   ANTERIOR LAT LUMBAR FUSION N/A 04/07/2017   Procedure: Thoracic twelve-Lumbar one Anterolateral decompression/fusion;  Surgeon: [name], MD;  Location: [place];  Service: Neurosurgery;  Laterality: N/A;  BACK SURGERY     x 3   CARDIAC CATHETERIZATION  11/2008   [name] - 20% calcified non flow limiting left main, 50% EF apical hypokinesis   CERVICAL DISC ARTHROPLASTY N/A 04/07/2017   Procedure: Cervical six-seven Disc arthroplasty;  Surgeon: [name], MD;  Location: [place];  Service: Neurosurgery;  Laterality: N/A;   CHEST TUBE INSERTION Left 04/11/2017   Procedure: CHEST TUBE INSERTION;  Surgeon: [name], MD;  Location: [place];  Service: Thoracic;  Laterality: Left;   CHOLECYSTECTOMY     COLONOSCOPY  2016   CG-MAC-miralax(good)servere TICS/TA x 2   ESOPHAGOGASTRODUODENOSCOPY     EYE SURGERY Bilateral 2011   steroidal encapsulation"  (04/28/2012)   FOOT SURGERY Right    x 2   KNEE ARTHROSCOPY Bilateral    LATERAL / POSTERIOR COMBINED FUSION LUMBAR SPINE  2012   POLYPECTOMY  2016   severe TICS/TA x 2   REVERSE SHOULDER ARTHROPLASTY  04/28/2012   Procedure: REVERSE SHOULDER ARTHROPLASTY;  Surgeon: [name], MD;  Location: [place];  Service: Orthopedics;  Laterality: Left;  Left shouder reverse total shoulder arthroplasty   SHOULDER SURGERY     TENDON REPAIR Right 08/07/2020   Procedure: RIGHT HAND LIGAMENT RECONSTRUCTION AND TENDON INTERPOSITION;  Surgeon: [name], MD;  Location: [place];  Service: Orthopedics;  Laterality: Right;   [place]   TOTAL HIP ARTHROPLASTY Left 2011   "left" (04/28/2012)   TOTAL HIP ARTHROPLASTY Right 07/31/2015   Procedure: TOTAL HIP ARTHROPLASTY ANTERIOR APPROACH;  Surgeon: [name], MD;  Location: [place];  Service: Orthopedics;  Laterality: Right;   TOTAL KNEE ARTHROPLASTY  01/10/2012   Procedure: TOTAL KNEE ARTHROPLASTY;  Surgeon: [name], MD;  Location: [place];  Service: Orthopedics;;  left total knee arthroplasty   TOTAL KNEE ARTHROPLASTY Right 06/17/2014   Procedure: TOTAL KNEE ARTHROPLASTY;  Surgeon: [name], MD;  Location: [place];  Service: Orthopedics;  Laterality: Right;    Current Medications: Current Meds  Medication Sig   albuterol (VENTOLIN HFA) 108 (90 Base) MCG/ACT inhaler INHALE 2 PUFFS INTO THE LUNGS EVERY 4 (FOUR) HOURS AS NEEDED FOR WHEEZING OR SHORTNESS OF BREATH.   allopurinol (ZYLOPRIM) 100 MG tablet Take 100 mg by mouth daily.   apixaban (ELIQUIS) 5 MG TABS tablet Take 1 tablet (5 mg total) by mouth 2 (two) times daily.   atorvastatin (LIPITOR) 20 MG tablet TAKE 1 TABLET BY MOUTH EVERY DAY   candesartan (ATACAND) 4 MG tablet TAKE 1/2 TABLETS (2 MG TOTAL) BY MOUTH DAILY.   cyclobenzaprine (FLEXERIL) 10 MG tablet Take 10 mg by mouth at bedtime as needed for muscle spasms.   diltiazem (CARDIZEM CD)  180 MG 24 hr capsule TAKE 1 CAPSULE BY MOUTH EVERY DAY   diltiazem (CARDIZEM) 30 MG tablet Take 30 mg by mouth as needed.   fluconazole (DIFLUCAN) 100 MG tablet Take 2 tablets (200 mg total) by mouth on day 1, then take 1 tablet (100 mg total) by mouth daily for 3 weeks   furosemide (LASIX) 20 MG tablet Take 1 tablet (20 mg total) by mouth daily as needed. For shortness of breath and lower limb edema   ipratropium-albuterol (DUONEB) 0.5-2.5 (3) MG/3ML SOLN 1 NEBULE EVERY 6 HOURS AS NEEDED. **J45.3**   isosorbide mononitrate (IMDUR) 30 MG 24 hr tablet TAKE 1 TABLET (30 MG TOTAL) BY MOUTH DAILY.   Nebulizers (COMPRESSOR/NEBULIZER) MISC Use as directed   NONFORMULARY OR COMPOUNDED ITEM  Apply 1-2 g topically. Compounded cream for neuropathy: amantadine 8%, baclofen 2%, gabapentin 6%, amitriptyline 4%, bupivacaine 2%, clonidine 0.2% Directions: Apply 3-4 times daily. Order sent to [facility]   nystatin (MYCOSTATIN) 100000 UNIT/ML suspension Take by mouth.   omeprazole (PRILOSEC) 40 MG capsule Take 1 capsule (40 mg total) by mouth 2 (two) times daily before a meal.   predniSONE (DELTASONE) 10 MG tablet TAKE 2 TABLETS (20 MG TOTAL) BY MOUTH DAILY. CONTINUOUS   STRIVERDI RESPIMAT 2.5 MCG/ACT AERS INHALE 2 PUFFS BY MOUTH INTO THE LUNGS DAILY   tamsulosin (FLOMAX) 0.4 MG CAPS capsule Take 0.4 mg by mouth at bedtime.    [DISCONTINUED] gabapentin (NEURONTIN) 300 MG capsule Take 1 capsule (300 mg total) by mouth 3 (three) times daily.     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 There is no history of Esophageal cancer, Rectal cancer, Stomach cancer, Pancreatic cancer, Kidney disease, or Liver disease.  ROS:   Please see the history of present illness.    All other systems reviewed and are negative.  EKGs/Labs/Other Studies Reviewed:    The following studies were reviewed today:  November 03, 2017 echo Left ventricular function moderately reduced, 40% No significant valvular abnormalities  May 09, 2012 EKG Atrial fibrillation   EKG:  The ekg ordered today demonstrates sinus rhythm with a ventricular rate of 90 bpm.  Normal intervals.  Recent Labs: 02/28/2020: ALT 15 09/13/2020: BUN 22; Creatinine, Ser 0.92; Hemoglobin 13.5; Platelets 182; Potassium 4.0; Sodium 136  Recent Lipid Panel    Component Value Date/Time   CHOL 188 09/28/2019 1017   TRIG 69 09/28/2019 1017   HDL 109 09/28/2019 1017   CHOLHDL 1.7 09/28/2019 1017   CHOLHDL 2 10/20/2014 1122   VLDL 12.0 10/20/2014 1122   LDLCALC 66 09/28/2019 1017    Physical Exam:    VS:  BP 122/74   Pulse 90   Ht _0  (1.88 m)   Wt 180 lb (81.6  kg)   BMI 23.11 kg/m     Wt Readings from Last  3 Encounters:  01/23/21 180 lb (81.6 kg)  01/11/21 180 lb (81.6 kg)  12/25/20 180 lb (81.6 kg)     GEN:  Well nourished, well developed in no acute distress HEENT: Normal NECK: No JVD; No carotid bruits LYMPHATICS: No lymphadenopathy CARDIAC: RRR, no murmurs, rubs, gallops RESPIRATORY:  Clear to auscultation without rales, wheezing or rhonchi  ABDOMEN: Soft, non-tender, non-distended MUSCULOSKELETAL:  No edema; No deformity  SKIN: Warm and dry NEUROLOGIC:  Alert and oriented x 3 PSYCHIATRIC:  Normal affect   ASSESSMENT:    1. Paroxysmal atrial fibrillation (HCC)   2. Chronic systolic heart failure ([place])   3. Chronic obstructive airway disease with asthma ([place])   4. Falls frequently    PLAN:    In order of problems listed above:  1. Paroxysmal atrial fibrillation [facility]) Patient with salvos of symptomatic atrial fibrillation.  Appears to be a very low burden.  On Eliquis currently for stroke prophylaxis.  He is interested in a long-term stroke prevention strategy that avoids long-term exposure to anticoagulation given his frequent falls with personal injury.  I think this is a very reasonable strategy for him.  We spent some time today also discussing treatment for his atrial fibrillation.  It appears that his burden of atrial fibrillation is quite low at this time.  We discussed potentially doing a PVI prior to watchman implant given the overall very low burden of his A. fib, I think it is reasonable to wait.  If, in the future, he was experiencing increased burden of atrial fibrillation, could reconsider ablation versus antiarrhythmic drug therapy.  I have seen Levi [name] in the office today who is being considered for a Watchman left atrial appendage closure device. I believe they will benefit from this procedure given their history of atrial fibrillation, CHA2DS2-VASc score of at least 2 and unadjusted ischemic stroke rate of  2.2% per year. Unfortunately, the patient is not felt to be a long term anticoagulation candidate secondary to frequent falls with personal injury. The patient's chart has been reviewed and I feel that they would be a candidate for short term oral anticoagulation after Watchman implant.   It is my belief that after undergoing a LAA closure procedure, Levi [name] will not need long term anticoagulation which eliminates anticoagulation side effects and major bleeding risk.   Procedural risks for the Watchman implant have been reviewed with the patient including a 0.5% risk of stroke, <1% risk of perforation and <1% risk of device embolization.    The published clinical data on the safety and effectiveness of WATCHMAN include but are not limited to the following: - Holmes DR, Mechele Claude, Sick P et al. for the PROTECT AF Investigators. Percutaneous closure of the left atrial appendage versus warfarin therapy for prevention of stroke in patients with atrial fibrillation: a randomised non-inferiority trial. Lancet 2009; 374: 534-42. Mechele Claude, Doshi SK, Abelardo Diesel D et al. on behalf of the PROTECT AF Investigators. Percutaneous Left Atrial Appendage Closure for Stroke Prophylaxis in Patients With Atrial Fibrillation 2.3-Year Follow-up of the PROTECT AF (Watchman Left Atrial Appendage System for Embolic Protection in Patients With Atrial Fibrillation) Trial. Circulation 2013; 127:720-729. - Alli O, Doshi S,  Kar S, Reddy VY, Sievert H et al. Quality of Life Assessment in the Randomized PROTECT AF (Percutaneous Closure of the Left Atrial Appendage Versus Warfarin Therapy for Prevention of Stroke in Patients With Atrial Fibrillation) Trial of Patients at Risk for Stroke With Nonvalvular Atrial Fibrillation.  J Am Coll Cardiol 2013; 09:8119-1. Vertell Limber DR, Tarri Abernethy, Price M, [place], Sievert H, Doshi S, Huber K, Reddy V. Prospective randomized evaluation of the Watchman left atrial appendage Device in  patients with atrial fibrillation versus long-term warfarin therapy; the PREVAIL trial. Journal of the [employer] of Cardiology, Vol. 4, No. 1, 2014, 1-11. - Kar S, Doshi SK, Sadhu A, Horton R, Osorio J et al. Primary outcome evaluation of a next-generation left atrial appendage closure device: results from the PINNACLE FLX trial. Circulation 2021;143(18)1754-1762.    After today's visit with the patient which was dedicated solely for shared decision making visit regarding LAA closure device, the patient decided to proceed with the LAA appendage closure procedure scheduled to be done in the near future at [facility]. Prior to the procedure, I would like to obtain a gated CT scan of the chest with contrast timed for PV/LA visualization.   Additionally, the patient will need an echocardiogram.  The patient will continue Eliquis 5 mg by mouth twice daily prior to watchman implant.  We will add aspirin 81 mg by mouth once daily at the time of watchman implant and continue the aspirin and Eliquis for 45 days following implant.  At the 45-day mark, plan will be to discontinue Eliquis and start Plavix until the 51-monthmark.   2. Chronic systolic heart failure (HCC) NYHA class II.  Warm and dry on exam.  EF 40% on most recent echo.  Plan to repeat echocardiogram today.  Continue Lasix, candesartan.  3. Chronic obstructive airway disease with asthma (HCC) On prednisone.  Breathing is stable.  Not on oxygen.  4. Falls frequently With frequent injury including a fractured hip.  See 1 above.           Total time spent with patient today 65 minutes. This includes reviewing records, evaluating the patient and coordinating care.  Medication Adjustments/Labs and Tests Ordered: Current medicines are reviewed at length with the patient today.  Concerns regarding medicines are outlined above.  Orders Placed This Encounter  Procedures   EKG 12-Lead   No orders of the defined types  were placed in this encounter.    Signed, CHilton Cork LQuentin Ore MD, [facility]8/30/2022 9:43 AM    Electrophysiology [facility]

## 2021-01-23 NOTE — Patient Instructions (Addendum)
Medication Instructions:  Your physician recommends that you continue on your current medications as directed. Please refer to the Current Medication list given to you today. *If you need a refill on your cardiac medications before your next appointment, please call your pharmacy*   Lab Work: You will get lab work today:  BMP   If you have labs (blood work) drawn today and your tests are completely normal, you will receive your results only by: [place] (if you have MyChart) OR A paper copy in the mail If you have any lab test that is abnormal or we need to change your treatment, we will call you to review the results.   Testing/Procedures: Your physician has requested that you have an echocardiogram. Echocardiography is a painless test that uses sound waves to create images of your heart. It provides your doctor with information about the size and shape of your heart and how well your heart's chambers and valves are working. This procedure takes approximately one hour. There are no restrictions for this procedure.   Please schedule ECHO   Your physician has requested that you have cardiac CT. Cardiac computed tomography (CT) is a painless test that uses an x-ray machine to take clear, detailed pictures of your heart.   You will get a call with date/time for this test when it is approved by your insurance.   Follow-Up: At [facility], you and your health needs are our priority.  As part of our continuing mission to provide you with exceptional heart care, we have created designated Provider Care Teams.  These Care Teams include your primary Cardiologist (physician) and Advanced Practice Providers (APPs -  Physician Assistants and Nurse Practitioners) who all work together to provide you with the care you need, when you need it.   Your next appointment:     Your next appointment will be at the [address] office after your current tests have been evaluated.     Nurse navigator:   Lenice Llamas [phone]       Left Atrial Appendage Closure Device Implantation    Left atrial appendage (LAA) closure device implantation is a procedure to put a small device in the LAA of the heart. The LAA is a small sac in the wall of the heart's left upper chamber. Blood clots can form in the LAA in people with atrial fibrillation (AFib). The device closes the LAA to help prevent a blood clot and stroke.

## 2021-01-23 NOTE — Progress Notes (Signed)
Electrophysiology Office Note:    Date:  01/23/2021   ID:  Levi [name], DOB Mar 31, 1946, MRN [id number]  PCP:  Shon Baton, MD  [facility] Cardiologist:  Candee Furbish, MD  [facility] Electrophysiologist:  Vickie Epley, MD   Referring MD: Jerline Pain, MD   Chief Complaint: Atrial fibrillation  History of Present Illness:    Levi [name] is a 75 y.o. male who presents for an evaluation of atrial fibrillation at the request of [name]. Their medical history includes symptomatic paroxysmal atrial fibrillation on Eliquis for stroke prophylaxis.  He also has a history of severe falls because of peripheral neuropathy.  His medical history also includes COPD and mild left ventricular dysfunction.  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  He tells me he has had multiple severe falls in the past with personal injury.  He has fractured a hip.  He uses a four-point cane inconsistently.  He is concerned about the risk of bleeding given his falls and long-term use of anticoagulation.  His atrial fibrillation comes and goes.  He tells me he will have frequent symptoms some weeks and then it will disappear for months at a time.  He is come to the emergency department on several occasions but frequently he will be back in normal rhythm by the time an EKG can be performed.  Past Medical History:  Diagnosis Date   Agent orange exposure 1970's   takes Imdur,Diltiazem, and Atacand daily   ALLERGIC RHINITIS    Allergy    seasonal allergies   Asthma    uses inhaler and nebulizers   Bruises easily    d/t prednisone daily   Cataract    CHF (congestive heart failure) (HCC)    dx-on meds to treat   Chronic bronchitis ([place])    "used to get it q yr; last time ?2008" (04/28/2012)   COPD (chronic obstructive pulmonary disease) (HCC)    agent orange exposure   Degenerative disk disease    "everywhere" (04/28/2012)   Diverticulitis    Dysrhythmia     afib   Elevated uric acid in blood    takes Allopurinol daily   Emphysema of lung ([place])    Enlarged prostate    but not on any meds   GERD (gastroesophageal reflux disease)    takes Protonix daily   H/O hiatal hernia    Headache    History of pneumonia    a. 2010   Hyperlipidemia    takes Lipitor daily; pt. states he takes a preventive   Joint pain    Joint swelling    Neuromuscular disorder ([place])    bilat legs, and bilat arms   PAF (paroxysmal atrial fibrillation) ([place])    a. Dx 04/2012, CHA2DS2VASc = 1 (age);  b. 04/2012 Echo: EF 40-50%, mild MR.   Personal history of colonic adenomas 02/09/2013   Shortness of breath dyspnea    Thyroid disease    no taking meds-caused dizziness    Past Surgical History:  Procedure Laterality Date   ANTERIOR CERVICAL DECOMP/DISCECTOMY FUSION N/A 07/21/2012   Procedure: ANTERIOR CERVICAL DECOMPRESSION/DISCECTOMY FUSION 2 LEVELS;  Surgeon: [name], MD;  Location: [facility];  Service: Neurosurgery;  Laterality: N/A;  C4-5 C5-6 Anterior cervical decompression/diskectomy/fusion   ANTERIOR LAT LUMBAR FUSION N/A 04/07/2017   Procedure: Thoracic twelve-Lumbar one Anterolateral decompression/fusion;  Surgeon: [name], MD;  Location: [facility];  Service: Neurosurgery;  Laterality: N/A;  BACK SURGERY     x 3   CARDIAC CATHETERIZATION  11/2008   Dr. Skains - 20% calcified non flow limiting left main, 50% EF apical hypokinesis   CERVICAL DISC ARTHROPLASTY N/A 04/07/2017   Procedure: Cervical six-seven Disc arthroplasty;  Surgeon: Elsner, Henry, MD;  Location[facility];  Service: Neurosurgery;  Laterality: N/A;   CHEST TUBE INSERTION Left 04/11/2017   Procedure: CHEST TUBE INSERTION;  Surgeon: Van Trigt, Peter, MD;  Location: [facility] OR;  Service: Thoracic;  Laterality: Left;   CHOLECYSTECTOMY     COLONOSCOPY  2016   CG-MAC-miralax(good)servere TICS/TA x 2   ESOPHAGOGASTRODUODENOSCOPY     EYE SURGERY Bilateral 2011   steroidal encapsulation"  (04/28/2012)   FOOT SURGERY Right    x 2   KNEE ARTHROSCOPY Bilateral    LATERAL / POSTERIOR COMBINED FUSION LUMBAR SPINE  2012   POLYPECTOMY  2016   severe TICS/TA x 2   REVERSE SHOULDER ARTHROPLASTY  04/28/2012   Procedure: REVERSE SHOULDER ARTHROPLASTY;  Surgeon: Justin William Chandler, MD;  Location[facility];  Service: Orthopedics;  Laterality: Left;  Left shouder reverse total shoulder arthroplasty   SHOULDER SURGERY     TENDON REPAIR Right 08/07/2020   Procedure: RIGHT HAND LIGAMENT RECONSTRUCTION AND TENDON INTERPOSITION;  Surgeon: Graves, Hendryx, MD;  Location: [facility];  Service: Orthopedics;  Laterality: Right;   TONSILLECTOMY AND ADENOIDECTOMY  1954   TOTAL HIP ARTHROPLASTY Left 2011   "left" (04/28/2012)   TOTAL HIP ARTHROPLASTY Right 07/31/2015   Procedure: TOTAL HIP ARTHROPLASTY ANTERIOR APPROACH;  Surgeon: Aditya Graves, MD;  [facility];  Service: Orthopedics;  Laterality: Right;   TOTAL KNEE ARTHROPLASTY  01/10/2012   Procedure: TOTAL KNEE ARTHROPLASTY;  Surgeon: Jaye L Graves, MD;  [facility];  Service: Orthopedics;;  left total knee arthroplasty   TOTAL KNEE ARTHROPLASTY Right 06/17/2014   Procedure: TOTAL KNEE ARTHROPLASTY;  Surgeon: Riyad L Graves, MD;  [facility];  Service: Orthopedics;  Laterality: Right;    Current Medications: Current Meds  Medication Sig   albuterol (VENTOLIN HFA) 108 (90 Base) MCG/ACT inhaler INHALE 2 PUFFS INTO THE LUNGS EVERY 4 (FOUR) HOURS AS NEEDED FOR WHEEZING OR SHORTNESS OF BREATH.   allopurinol (ZYLOPRIM) 100 MG tablet Take 100 mg by mouth daily.   apixaban (ELIQUIS) 5 MG TABS tablet Take 1 tablet (5 mg total) by mouth 2 (two) times daily.   atorvastatin (LIPITOR) 20 MG tablet TAKE 1 TABLET BY MOUTH EVERY DAY   candesartan (ATACAND) 4 MG tablet TAKE 1/2 TABLETS (2 MG TOTAL) BY MOUTH DAILY.   cyclobenzaprine (FLEXERIL) 10 MG tablet Take 10 mg by mouth at bedtime as needed for muscle spasms.   diltiazem (CARDIZEM CD)  180 MG 24 hr capsule TAKE 1 CAPSULE BY MOUTH EVERY DAY   diltiazem (CARDIZEM) 30 MG tablet Take 30 mg by mouth as needed.   fluconazole (DIFLUCAN) 100 MG tablet Take 2 tablets (200 mg total) by mouth on day 1, then take 1 tablet (100 mg total) by mouth daily for 3 weeks   furosemide (LASIX) 20 MG tablet Take 1 tablet (20 mg total) by mouth daily as needed. For shortness of breath and lower limb edema   ipratropium-albuterol (DUONEB) 0.5-2.5 (3) MG/3ML SOLN 1 NEBULE EVERY 6 HOURS AS NEEDED. **J45.3**   isosorbide mononitrate (IMDUR) 30 MG 24 hr tablet TAKE 1 TABLET (30 MG TOTAL) BY MOUTH DAILY.   Nebulizers (COMPRESSOR/NEBULIZER) MISC Use as directed   NONFORMULARY OR COMPOUNDED ITEM   Apply 1-2 g topically. Compounded cream for neuropathy: amantadine 8%, baclofen 2%, gabapentin 6%, amitriptyline 4%, bupivacaine 2%, clonidine 0.2% Directions: Apply 3-4 times daily. Order sent to [facility]   nystatin (MYCOSTATIN) 100000 UNIT/ML suspension Take by mouth.   omeprazole (PRILOSEC) 40 MG capsule Take 1 capsule (40 mg total) by mouth 2 (two) times daily before a meal.   predniSONE (DELTASONE) 10 MG tablet TAKE 2 TABLETS (20 MG TOTAL) BY MOUTH DAILY. CONTINUOUS   STRIVERDI RESPIMAT 2.5 MCG/ACT AERS INHALE 2 PUFFS BY MOUTH INTO THE LUNGS DAILY   tamsulosin (FLOMAX) 0.4 MG CAPS capsule Take 0.4 mg by mouth at bedtime.    [DISCONTINUED] gabapentin (NEURONTIN) 300 MG capsule Take 1 capsule (300 mg total) by mouth 3 (three) times daily.     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 There is no history of Esophageal cancer, Rectal cancer, Stomach cancer, Pancreatic cancer, Kidney disease, or Liver disease.  ROS:   Please see the history of present illness.    All other systems reviewed and are negative.  EKGs/Labs/Other Studies Reviewed:    The following studies were reviewed today:  November 03, 2017 echo Left ventricular function moderately reduced, 40% No significant valvular abnormalities  May 09, 2012 EKG Atrial fibrillation   EKG:  The ekg ordered today demonstrates sinus rhythm with a ventricular rate of 90 bpm.  Normal intervals.  Recent Labs: 02/28/2020: ALT 15 09/13/2020: BUN 22; Creatinine, Ser 0.92; Hemoglobin 13.5; Platelets 182; Potassium 4.0; Sodium 136  Recent Lipid Panel    Component Value Date/Time   CHOL 188 09/28/2019 1017   TRIG 69 09/28/2019 1017   HDL 109 09/28/2019 1017   CHOLHDL 1.7 09/28/2019 1017   CHOLHDL 2 10/20/2014 1122   VLDL 12.0 10/20/2014 1122   LDLCALC 66 09/28/2019 1017    Physical Exam:    VS:  BP 122/74   Pulse 90   Ht _0  (1.88 m)   Wt 180 lb (81.6  kg)   BMI 23.11 kg/m     Wt Readings from Last  3 Encounters:  01/23/21 180 lb (81.6 kg)  01/11/21 180 lb (81.6 kg)  12/25/20 180 lb (81.6 kg)     GEN:  Well nourished, well developed in no acute distress HEENT: Normal NECK: No JVD; No carotid bruits LYMPHATICS: No lymphadenopathy CARDIAC: RRR, no murmurs, rubs, gallops RESPIRATORY:  Clear to auscultation without rales, wheezing or rhonchi  ABDOMEN: Soft, non-tender, non-distended MUSCULOSKELETAL:  No edema; No deformity  SKIN: Warm and dry NEUROLOGIC:  Alert and oriented x 3 PSYCHIATRIC:  Normal affect   ASSESSMENT:    1. Paroxysmal atrial fibrillation (HCC)   2. Chronic systolic heart failure ([place])   3. Chronic obstructive airway disease with asthma ([place])   4. Falls frequently    PLAN:    In order of problems listed above:  1. Paroxysmal atrial fibrillation [facility]) Patient with salvos of symptomatic atrial fibrillation.  Appears to be a very low burden.  On Eliquis currently for stroke prophylaxis.  He is interested in a long-term stroke prevention strategy that avoids long-term exposure to anticoagulation given his frequent falls with personal injury.  I think this is a very reasonable strategy for him.  We spent some time today also discussing treatment for his atrial fibrillation.  It appears that his burden of atrial fibrillation is quite low at this time.  We discussed potentially doing a PVI prior to watchman implant given the overall very low burden of his A. fib, I think it is reasonable to wait.  If, in the future, he was experiencing increased burden of atrial fibrillation, could reconsider ablation versus antiarrhythmic drug therapy.  I have seen [name] in the office today who is being considered for a Watchman left atrial appendage closure device. I believe they will benefit from this procedure given their history of atrial fibrillation, CHA2DS2-VASc score of at least 2 and unadjusted ischemic stroke rate of  2.2% per year. Unfortunately, the patient is not felt to be a long term anticoagulation candidate secondary to frequent falls with personal injury. The patient's chart has been reviewed and I feel that they would be a candidate for short term oral anticoagulation after Watchman implant.   It is my belief that after undergoing a LAA closure procedure, [name] will not need long term anticoagulation which eliminates anticoagulation side effects and major bleeding risk.   Procedural risks for the Watchman implant have been reviewed with the patient including a 0.5% risk of stroke, <1% risk of perforation and <1% risk of device embolization.    The published clinical data on the safety and effectiveness of WATCHMAN include but are not limited to the following: - Holmes DR, Mechele Claude, Sick P et al. for the PROTECT AF Investigators. Percutaneous closure of the left atrial appendage versus warfarin therapy for prevention of stroke in patients with atrial fibrillation: a randomised non-inferiority trial. Lancet 2009; 374: 534-42. Mechele Claude, Doshi SK, Abelardo Diesel D et al. on behalf of the PROTECT AF Investigators. Percutaneous Left Atrial Appendage Closure for Stroke Prophylaxis in Patients With Atrial Fibrillation 2.3-Year Follow-up of the PROTECT AF (Watchman Left Atrial Appendage System for Embolic Protection in Patients With Atrial Fibrillation) Trial. Circulation 2013; 127:720-729. - Alli O, Doshi S,  Kar S, Reddy VY, Sievert H et al. Quality of Life Assessment in the Randomized PROTECT AF (Percutaneous Closure of the Left Atrial Appendage Versus Warfarin Therapy for Prevention of Stroke in Patients With Atrial Fibrillation) Trial of Patients at Risk for Stroke With Nonvalvular Atrial Fibrillation.  J Am Coll Cardiol 2013; 82:5003-7. Vertell Limber DR, Tarri Abernethy, Price M, [place], Sievert H, Doshi S, Huber K, Reddy V. Prospective randomized evaluation of the Watchman left atrial appendage Device in  patients with atrial fibrillation versus long-term warfarin therapy; the PREVAIL trial. Journal of the [employer] of Cardiology, Vol. 4, No. 1, 2014, 1-11. - Kar S, Doshi SK, Sadhu A, Horton R, Osorio J et al. Primary outcome evaluation of a next-generation left atrial appendage closure device: results from the PINNACLE FLX trial. Circulation 2021;143(18)1754-1762.    After today's visit with the patient which was dedicated solely for shared decision making visit regarding LAA closure device, the patient decided to proceed with the LAA appendage closure procedure scheduled to be done in the near future at [facility]. Prior to the procedure, I would like to obtain a gated CT scan of the chest with contrast timed for PV/LA visualization.   Additionally, the patient will need an echocardiogram.  The patient will continue Eliquis 5 mg by mouth twice daily prior to watchman implant.  We will add aspirin 81 mg by mouth once daily at the time of watchman implant and continue the aspirin and Eliquis for 45 days following implant.  At the 45-day mark, plan will be to discontinue Eliquis and start Plavix until the 44-monthmark.   2. Chronic systolic heart failure (HCC) NYHA class II.  Warm and dry on exam.  EF 40% on most recent echo.  Plan to repeat echocardiogram today.  Continue Lasix, candesartan.  3. Chronic obstructive airway disease with asthma (HCC) On prednisone.  Breathing is stable.  Not on oxygen.  4. Falls frequently With frequent injury including a fractured hip.  See 1 above.           Total time spent with patient today 65 minutes. This includes reviewing records, evaluating the patient and coordinating care.  Medication Adjustments/Labs and Tests Ordered: Current medicines are reviewed at length with the patient today.  Concerns regarding medicines are outlined above.  Orders Placed This Encounter  Procedures   EKG 12-Lead   No orders of the defined types  were placed in this encounter.    Signed, CHilton Cork LQuentin Ore MD, [facility]8/30/2022 9:43 AM    Electrophysiology [facility]

## 2021-01-24 DIAGNOSIS — M542 Cervicalgia: Secondary | ICD-10-CM | POA: Diagnosis not present

## 2021-01-24 DIAGNOSIS — M4722 Other spondylosis with radiculopathy, cervical region: Secondary | ICD-10-CM | POA: Diagnosis not present

## 2021-01-26 ENCOUNTER — Telehealth (HOSPITAL_COMMUNITY): Payer: Self-pay | Admitting: *Deleted

## 2021-01-26 NOTE — Telephone Encounter (Signed)
Patient returning call regarding upcoming cardiac imaging study; pt verbalizes understanding of appt date/time, parking situation and where to check in, pre-test NPO status and medications ordered, and verified current allergies; name and call back number provided for further questions should they arise  Gordy Clement RN Navigator Cardiac Imaging [facility] [phone] office [phone] cell  Patient to take '25mg'$  metoprolol tartrate two hours prior to cardiac CT if HR greater than 75bpm.

## 2021-01-27 ENCOUNTER — Other Ambulatory Visit: Payer: Self-pay | Admitting: Cardiology

## 2021-01-30 ENCOUNTER — Telehealth (HOSPITAL_COMMUNITY): Payer: Self-pay | Admitting: Emergency Medicine

## 2021-01-30 DIAGNOSIS — M542 Cervicalgia: Secondary | ICD-10-CM | POA: Diagnosis not present

## 2021-01-30 DIAGNOSIS — M4722 Other spondylosis with radiculopathy, cervical region: Secondary | ICD-10-CM | POA: Diagnosis not present

## 2021-01-30 NOTE — Telephone Encounter (Signed)
Reaching out to patient to offer assistance regarding upcoming cardiac imaging study; pt verbalizes understanding of appt date/time, parking situation and where to check in, pre-test NPO status and medications ordered, and verified current allergies; name and call back number provided for further questions should they arise Marchia Bond RN Navigator Cardiac Imaging [facility] [phone] office [phone] cell  Holding lasix Denies iv issues Denies claustro Additional '25mg'$  metoprolol tart if HR > 75bpm

## 2021-01-31 ENCOUNTER — Other Ambulatory Visit: Payer: Self-pay

## 2021-01-31 ENCOUNTER — Ambulatory Visit (HOSPITAL_COMMUNITY)
Admission: RE | Admit: 2021-01-31 | Discharge: 2021-01-31 | Disposition: A | Discharge status: Home / Self Care | Payer: Medicare Other | Source: Ambulatory Visit | Attending: Cardiology | Admitting: Cardiology

## 2021-01-31 DIAGNOSIS — R296 Repeated falls: Secondary | ICD-10-CM | POA: Diagnosis not present

## 2021-01-31 DIAGNOSIS — J449 Chronic obstructive pulmonary disease, unspecified: Secondary | ICD-10-CM | POA: Insufficient documentation

## 2021-01-31 DIAGNOSIS — I5022 Chronic systolic (congestive) heart failure: Secondary | ICD-10-CM

## 2021-01-31 DIAGNOSIS — I480 Paroxysmal atrial fibrillation: Secondary | ICD-10-CM | POA: Diagnosis not present

## 2021-01-31 MED ORDER — IOHEXOL 350 MG/ML SOLN
95.0000 mL | Freq: Once | INTRAVENOUS | Status: AC | PRN
  Administered 2021-01-31: 95 mL via INTRAVENOUS

## 2021-02-01 DIAGNOSIS — Z23 Encounter for immunization: Secondary | ICD-10-CM | POA: Diagnosis not present

## 2021-02-02 ENCOUNTER — Telehealth: Payer: Self-pay | Admitting: Gastroenterology

## 2021-02-02 DIAGNOSIS — M4722 Other spondylosis with radiculopathy, cervical region: Secondary | ICD-10-CM | POA: Diagnosis not present

## 2021-02-02 DIAGNOSIS — R030 Elevated blood-pressure reading, without diagnosis of hypertension: Secondary | ICD-10-CM | POA: Diagnosis not present

## 2021-02-02 NOTE — Telephone Encounter (Signed)
Spoke to [employer] and she said she will fax over the forms on this patient.Olegario Messier can you please see about completing this and faxing it back

## 2021-02-02 NOTE — Telephone Encounter (Signed)
Pt called to inform that he received a letter from his insurance company today stating that we need to begin a clinic process for Omeprazole. He stated that it is a process that we have to go through every year. The phone number to begin process is [phone].

## 2021-02-05 ENCOUNTER — Telehealth: Payer: Self-pay

## 2021-02-05 NOTE — Telephone Encounter (Signed)
Patient is on Prilosec (omeprazole) 40 mg PO BID, requesting for PA renewal. On endoscopy procedure done on 01/11/21, you wanted him to take Prilosec (omeprazole) 20 mg PO BID for 6 weeks. Do you want to decrease the dose? Just wanted to confirm it before I initiate the PA.

## 2021-02-06 ENCOUNTER — Other Ambulatory Visit: Payer: Self-pay

## 2021-02-06 ENCOUNTER — Telehealth: Payer: Self-pay

## 2021-02-06 ENCOUNTER — Other Ambulatory Visit: Payer: Self-pay | Admitting: Cardiology

## 2021-02-06 DIAGNOSIS — I480 Paroxysmal atrial fibrillation: Secondary | ICD-10-CM

## 2021-02-06 DIAGNOSIS — R296 Repeated falls: Secondary | ICD-10-CM

## 2021-02-06 NOTE — Telephone Encounter (Signed)
-----   Message from Vickie Epley, MD sent at 02/05/2021  9:49 PM EDT ----- CT looks good. OK to continue Watchman evaluation/scheduling.  Lysbeth Galas T. Quentin Ore, MD, [facility], [facility] Cardiac Electrophysiology

## 2021-02-06 NOTE — Telephone Encounter (Signed)
Reviewed results with patient who verbalized understanding.   The patient agrees to [facility] implant 02/22/2021. Will schedule, send the patient instructions, and call next week to review. He was grateful for call and agrees with plan.

## 2021-02-08 ENCOUNTER — Other Ambulatory Visit: Payer: Self-pay | Admitting: Neurological Surgery

## 2021-02-08 DIAGNOSIS — M4722 Other spondylosis with radiculopathy, cervical region: Secondary | ICD-10-CM

## 2021-02-08 NOTE — Telephone Encounter (Signed)
I called and LVM to the patient regarding his medication.

## 2021-02-08 NOTE — Telephone Encounter (Signed)
Yes, I would like him to start reducing his dose to 20 mg bid to see if that still controls his symptoms at a lower dose.  If symptoms are well controlled after 6 weeks, plan to reduce to 20 mg/day as we like to try to get patients to the lowest effective dose.  If he has breakthrough after reducing to 20 mg bid, then complete PA for continued 40 mg bid dosing.  Thanks.

## 2021-02-09 ENCOUNTER — Other Ambulatory Visit: Payer: Self-pay

## 2021-02-09 DIAGNOSIS — R296 Repeated falls: Secondary | ICD-10-CM

## 2021-02-09 DIAGNOSIS — I480 Paroxysmal atrial fibrillation: Secondary | ICD-10-CM

## 2021-02-09 MED ORDER — OMEPRAZOLE 20 MG PO CPDR: 20.0000 mg | DELAYED_RELEASE_CAPSULE | Freq: Two times a day (BID) | ORAL | 0 refills | Status: DC

## 2021-02-09 NOTE — Telephone Encounter (Signed)
Patient called to inform us Omeprazole 20 mg BID is not covered by his insurance since it is OTC. Only 40 mg  once daily is covered. I initiated PA 20 mg BID over by phone with [facility] - spoke with Teress. If it is denied, is it ok to refill Omeprazole 40 mg once daily?

## 2021-02-09 NOTE — Telephone Encounter (Signed)
New Rx sent to the pharmacy. Spoke with the patient to start reducing his dose to 20 mg bid to see if that still controls his symptoms at a lower dose.  If symptoms are well controlled after 6 weeks, plan to reduce to 20 mg/day. If he has breakthrough after reducing to 20 mg bid then complete PA for continued 40 mg bid dosing. Patient verbalized understanding.

## 2021-02-09 NOTE — Addendum Note (Signed)
Addended by: Howell Pringle on: 02/09/2021 09:57 AM   Modules accepted: Orders

## 2021-02-12 MED ORDER — OMEPRAZOLE 40 MG PO CPDR: 40.0000 mg | DELAYED_RELEASE_CAPSULE | Freq: Every day | ORAL | 1 refills | Status: DC

## 2021-02-12 NOTE — Addendum Note (Signed)
Addended by: Howell Pringle on: 02/12/2021 09:11 AM   Modules accepted: Orders

## 2021-02-12 NOTE — Telephone Encounter (Signed)
Omeprazole 20 mg BID denied by his insurance. Omeprazole 40 mg once daily is sent to his pharmacy. I left the VM to the patient to call me back if he has any questions.

## 2021-02-14 ENCOUNTER — Other Ambulatory Visit: Payer: Self-pay

## 2021-02-14 ENCOUNTER — Ambulatory Visit (HOSPITAL_COMMUNITY): Payer: Medicare Other | Attending: Cardiology

## 2021-02-14 DIAGNOSIS — I480 Paroxysmal atrial fibrillation: Secondary | ICD-10-CM

## 2021-02-14 DIAGNOSIS — J4489 Other specified chronic obstructive pulmonary disease: Secondary | ICD-10-CM

## 2021-02-14 DIAGNOSIS — R296 Repeated falls: Secondary | ICD-10-CM

## 2021-02-14 DIAGNOSIS — I5022 Chronic systolic (congestive) heart failure: Secondary | ICD-10-CM

## 2021-02-14 DIAGNOSIS — J449 Chronic obstructive pulmonary disease, unspecified: Secondary | ICD-10-CM | POA: Insufficient documentation

## 2021-02-14 LAB — ECHOCARDIOGRAM COMPLETE
AR max vel: 2.23 cm2
AV Area VTI: 2.34 cm2
AV Area mean vel: 2.19 cm2
AV Mean grad: 6.8 mmHg
AV Peak grad: 12.9 mmHg
Ao pk vel: 1.8 m/s
Area-P 1/2: 3.99 cm2
S' Lateral: 2.9 cm

## 2021-02-19 ENCOUNTER — Telehealth: Payer: Self-pay

## 2021-02-19 DIAGNOSIS — I480 Paroxysmal atrial fibrillation: Secondary | ICD-10-CM

## 2021-02-19 NOTE — Telephone Encounter (Signed)
Reviewed instructions and confirmed Covid test with patient for tomorrow. He will also have a repeat CBC tomorrow as well. He understands he will only be called if Covid test is positive, but he knows he is welcome to call if he has any questions. He was grateful for call and agrees with plan.

## 2021-02-19 NOTE — Telephone Encounter (Signed)
Called to review Watchman instructions and confirm Covid test tomorrow. Will arrange CBC with Covid test.  Left message to call back.

## 2021-02-20 ENCOUNTER — Other Ambulatory Visit (HOSPITAL_COMMUNITY)
Admission: RE | Admit: 2021-02-20 | Discharge: 2021-02-20 | Disposition: A | Discharge status: Home / Self Care | Payer: Medicare Other | Source: Ambulatory Visit | Attending: Cardiology | Admitting: Cardiology

## 2021-02-20 ENCOUNTER — Other Ambulatory Visit: Payer: Self-pay

## 2021-02-20 ENCOUNTER — Other Ambulatory Visit: Payer: Medicare Other

## 2021-02-20 DIAGNOSIS — Z20822 Contact with and (suspected) exposure to covid-19: Secondary | ICD-10-CM | POA: Insufficient documentation

## 2021-02-20 DIAGNOSIS — I480 Paroxysmal atrial fibrillation: Secondary | ICD-10-CM

## 2021-02-20 DIAGNOSIS — Z01812 Encounter for preprocedural laboratory examination: Secondary | ICD-10-CM | POA: Insufficient documentation

## 2021-02-20 LAB — CBC WITH DIFFERENTIAL/PLATELET
Basophils Absolute: 0.1 10*3/uL (ref 0.0–0.2)
Basos: 1 %
EOS (ABSOLUTE): 0 10*3/uL (ref 0.0–0.4)
Eos: 0 %
Hematocrit: 39.1 % (ref 37.5–51.0)
Hemoglobin: 13.2 g/dL (ref 13.0–17.7)
Immature Grans (Abs): 0.2 10*3/uL — ABNORMAL HIGH (ref 0.0–0.1)
Immature Granulocytes: 2 %
Lymphocytes Absolute: 1.1 10*3/uL (ref 0.7–3.1)
Lymphs: 12 %
MCH: 32.1 pg (ref 26.6–33.0)
MCHC: 33.8 g/dL (ref 31.5–35.7)
MCV: 95 fL (ref 79–97)
Monocytes Absolute: 0.5 10*3/uL (ref 0.1–0.9)
Monocytes: 6 %
Neutrophils Absolute: 7.6 10*3/uL — ABNORMAL HIGH (ref 1.4–7.0)
Neutrophils: 79 %
Platelets: 164 10*3/uL (ref 150–450)
RBC: 4.11 x10E6/uL — ABNORMAL LOW (ref 4.14–5.80)
RDW: 14.4 % (ref 11.6–15.4)
WBC: 9.5 10*3/uL (ref 3.4–10.8)

## 2021-02-20 LAB — SARS CORONAVIRUS 2 (TAT 6-24 HRS): SARS Coronavirus 2: NEGATIVE

## 2021-02-21 NOTE — Telephone Encounter (Signed)
Confirmed new time for patient's LAAO tomorrow. The patient will now arrive at 0730 for 0930 case. He was grateful for call and agrees with plan.

## 2021-02-22 ENCOUNTER — Ambulatory Visit (HOSPITAL_COMMUNITY)
Admission: RE | Admit: 2021-02-22 | Discharge: 2021-02-22 | Disposition: A | Discharge status: Home / Self Care | Payer: Medicare Other | Source: Ambulatory Visit | Attending: Cardiology | Admitting: Cardiology

## 2021-02-22 ENCOUNTER — Inpatient Hospital Stay (HOSPITAL_COMMUNITY): Payer: Medicare Other | Admitting: Vascular Surgery

## 2021-02-22 ENCOUNTER — Encounter (HOSPITAL_COMMUNITY): Payer: Self-pay | Admitting: Cardiology

## 2021-02-22 ENCOUNTER — Ambulatory Visit (HOSPITAL_BASED_OUTPATIENT_CLINIC_OR_DEPARTMENT_OTHER)
Admission: RE | Admit: 2021-02-22 | Discharge: 2021-02-22 | Disposition: A | Discharge status: Home / Self Care | Payer: Medicare Other | Source: Ambulatory Visit | Attending: Cardiology | Admitting: Cardiology

## 2021-02-22 ENCOUNTER — Ambulatory Visit (HOSPITAL_COMMUNITY): Payer: Medicare Other

## 2021-02-22 ENCOUNTER — Encounter (HOSPITAL_COMMUNITY)
Admission: RE | Disposition: A | Discharge status: Home / Self Care | Payer: Self-pay | Source: Ambulatory Visit | Attending: Cardiology

## 2021-02-22 ENCOUNTER — Other Ambulatory Visit: Payer: Self-pay

## 2021-02-22 DIAGNOSIS — R296 Repeated falls: Secondary | ICD-10-CM | POA: Diagnosis not present

## 2021-02-22 DIAGNOSIS — Z01818 Encounter for other preprocedural examination: Secondary | ICD-10-CM

## 2021-02-22 DIAGNOSIS — G629 Polyneuropathy, unspecified: Secondary | ICD-10-CM | POA: Diagnosis not present

## 2021-02-22 DIAGNOSIS — I480 Paroxysmal atrial fibrillation: Secondary | ICD-10-CM | POA: Diagnosis present

## 2021-02-22 DIAGNOSIS — Z006 Encounter for examination for normal comparison and control in clinical research program: Secondary | ICD-10-CM | POA: Diagnosis not present

## 2021-02-22 DIAGNOSIS — I4891 Unspecified atrial fibrillation: Secondary | ICD-10-CM | POA: Diagnosis not present

## 2021-02-22 DIAGNOSIS — Z7901 Long term (current) use of anticoagulants: Secondary | ICD-10-CM | POA: Insufficient documentation

## 2021-02-22 DIAGNOSIS — I5022 Chronic systolic (congestive) heart failure: Secondary | ICD-10-CM | POA: Diagnosis not present

## 2021-02-22 DIAGNOSIS — Z91048 Other nonmedicinal substance allergy status: Secondary | ICD-10-CM | POA: Diagnosis not present

## 2021-02-22 DIAGNOSIS — E785 Hyperlipidemia, unspecified: Secondary | ICD-10-CM | POA: Diagnosis not present

## 2021-02-22 DIAGNOSIS — Z885 Allergy status to narcotic agent status: Secondary | ICD-10-CM | POA: Diagnosis not present

## 2021-02-22 DIAGNOSIS — Z79899 Other long term (current) drug therapy: Secondary | ICD-10-CM | POA: Diagnosis not present

## 2021-02-22 DIAGNOSIS — K219 Gastro-esophageal reflux disease without esophagitis: Secondary | ICD-10-CM | POA: Diagnosis not present

## 2021-02-22 DIAGNOSIS — I519 Heart disease, unspecified: Secondary | ICD-10-CM

## 2021-02-22 DIAGNOSIS — J449 Chronic obstructive pulmonary disease, unspecified: Secondary | ICD-10-CM | POA: Diagnosis present

## 2021-02-22 DIAGNOSIS — Z881 Allergy status to other antibiotic agents status: Secondary | ICD-10-CM | POA: Insufficient documentation

## 2021-02-22 HISTORY — PX: LEFT ATRIAL APPENDAGE OCCLUSION: EP1229

## 2021-02-22 HISTORY — PX: TEE WITHOUT CARDIOVERSION: SHX5443

## 2021-02-22 LAB — CBC
HCT: 43.3 % (ref 39.0–52.0)
Hemoglobin: 14.1 g/dL (ref 13.0–17.0)
MCH: 32.5 pg (ref 26.0–34.0)
MCHC: 32.6 g/dL (ref 30.0–36.0)
MCV: 99.8 fL (ref 80.0–100.0)
Platelets: 164 10*3/uL (ref 150–400)
RBC: 4.34 MIL/uL (ref 4.22–5.81)
RDW: 15.4 % (ref 11.5–15.5)
WBC: 7.8 10*3/uL (ref 4.0–10.5)
nRBC: 0 % (ref 0.0–0.2)

## 2021-02-22 LAB — BASIC METABOLIC PANEL
Anion gap: 11 (ref 5–15)
BUN: 16 mg/dL (ref 8–23)
CO2: 21 mmol/L — ABNORMAL LOW (ref 22–32)
Calcium: 9.5 mg/dL (ref 8.9–10.3)
Chloride: 107 mmol/L (ref 98–111)
Creatinine, Ser: 1.01 mg/dL (ref 0.61–1.24)
GFR, Estimated: >60 mL/min (ref >60)
Glucose, Bld: 88 mg/dL (ref 70–99)
Potassium: 3.6 mmol/L (ref 3.5–5.1)
Sodium: 139 mmol/L (ref 135–145)

## 2021-02-22 LAB — TYPE AND SCREEN
ABO/RH(D): O POS
Antibody Screen: NEGATIVE

## 2021-02-22 LAB — SURGICAL PCR SCREEN
MRSA, PCR: NEGATIVE
Staphylococcus aureus: NEGATIVE

## 2021-02-22 SURGERY — LEFT ATRIAL APPENDAGE OCCLUSION
Anesthesia: General

## 2021-02-22 MED ORDER — CHLORHEXIDINE GLUCONATE 0.12 % MT SOLN
OROMUCOSAL | Status: AC
  Administered 2021-02-22: 15 mL
  Filled 2021-02-22: qty 15

## 2021-02-22 MED ORDER — HEPARIN SODIUM (PORCINE) 1000 UNIT/ML IJ SOLN
INTRAMUSCULAR | Status: DC | PRN
Start: 2021-02-22 — End: 2021-02-22
  Administered 2021-02-22: 12000 [IU] via INTRAVENOUS

## 2021-02-22 MED ORDER — DEXAMETHASONE SODIUM PHOSPHATE 10 MG/ML IJ SOLN
INTRAMUSCULAR | Status: DC | PRN
  Administered 2021-02-22: 10 mg via INTRAVENOUS

## 2021-02-22 MED ORDER — PROTAMINE SULFATE 10 MG/ML IV SOLN
INTRAVENOUS | Status: DC | PRN
  Administered 2021-02-22: 20 mg via INTRAVENOUS

## 2021-02-22 MED ORDER — HEPARIN (PORCINE) IN NACL 2000-0.9 UNIT/L-% IV SOLN
INTRAVENOUS | Status: DC | PRN
  Administered 2021-02-22: 1000 mL

## 2021-02-22 MED ORDER — ACETAMINOPHEN 325 MG PO TABS: 650.0000 mg | ORAL_TABLET | ORAL | Status: DC | PRN

## 2021-02-22 MED ORDER — SODIUM CHLORIDE 0.9 % IV SOLN: INTRAVENOUS | Status: DC

## 2021-02-22 MED ORDER — CEFAZOLIN SODIUM-DEXTROSE 2-4 GM/100ML-% IV SOLN
2.0000 g | INTRAVENOUS | Status: DC
  Filled 2021-02-22: qty 100

## 2021-02-22 MED ORDER — SODIUM CHLORIDE 0.9% FLUSH: 3.0000 mL | Freq: Two times a day (BID) | INTRAVENOUS | Status: DC

## 2021-02-22 MED ORDER — SODIUM CHLORIDE 0.9 % IV SOLN: 250.0000 mL | INTRAVENOUS | Status: DC | PRN

## 2021-02-22 MED ORDER — ONDANSETRON HCL 4 MG/2ML IJ SOLN
INTRAMUSCULAR | Status: DC | PRN
  Administered 2021-02-22: 4 mg via INTRAVENOUS

## 2021-02-22 MED ORDER — ONDANSETRON HCL 4 MG/2ML IJ SOLN: 4.0000 mg | Freq: Four times a day (QID) | INTRAMUSCULAR | Status: DC | PRN

## 2021-02-22 MED ORDER — SODIUM CHLORIDE 0.9% FLUSH: 3.0000 mL | INTRAVENOUS | Status: DC | PRN

## 2021-02-22 MED ORDER — SUGAMMADEX SODIUM 200 MG/2ML IV SOLN
INTRAVENOUS | Status: DC | PRN
  Administered 2021-02-22: 400 mg via INTRAVENOUS

## 2021-02-22 MED ORDER — PHENYLEPHRINE HCL-NACL 20-0.9 MG/250ML-% IV SOLN
INTRAVENOUS | Status: DC | PRN
  Administered 2021-02-22: 20 ug/min via INTRAVENOUS

## 2021-02-22 MED ORDER — LIDOCAINE 2% (20 MG/ML) 5 ML SYRINGE
INTRAMUSCULAR | Status: DC | PRN
  Administered 2021-02-22: 80 mg via INTRAVENOUS

## 2021-02-22 MED ORDER — FENTANYL CITRATE (PF) 250 MCG/5ML IJ SOLN
INTRAMUSCULAR | Status: DC | PRN
  Administered 2021-02-22: 100 ug via INTRAVENOUS

## 2021-02-22 MED ORDER — CEFAZOLIN SODIUM-DEXTROSE 2-3 GM-%(50ML) IV SOLR
INTRAVENOUS | Status: DC | PRN
  Administered 2021-02-22: 2 g via INTRAVENOUS

## 2021-02-22 MED ORDER — LACTATED RINGERS IV SOLN: INTRAVENOUS | Status: DC

## 2021-02-22 MED ORDER — ROCURONIUM BROMIDE 10 MG/ML (PF) SYRINGE
PREFILLED_SYRINGE | INTRAVENOUS | Status: DC | PRN
  Administered 2021-02-22: 70 mg via INTRAVENOUS

## 2021-02-22 MED ORDER — PROPOFOL 10 MG/ML IV BOLUS
INTRAVENOUS | Status: DC | PRN
  Administered 2021-02-22: 150 mg via INTRAVENOUS

## 2021-02-22 MED ORDER — HEPARIN (PORCINE) IN NACL 1000-0.9 UT/500ML-% IV SOLN
INTRAVENOUS | Status: DC | PRN
  Administered 2021-02-22: 500 mL

## 2021-02-22 MED ORDER — ACETAMINOPHEN 10 MG/ML IV SOLN
1000.0000 mg | Freq: Once | INTRAVENOUS | Status: DC | PRN
  Filled 2021-02-22: qty 100

## 2021-02-22 MED ORDER — MIDAZOLAM HCL 2 MG/2ML IJ SOLN
INTRAMUSCULAR | Status: DC | PRN
  Administered 2021-02-22: 2 mg via INTRAVENOUS

## 2021-02-22 SURGICAL SUPPLY — 16 items
CATH DIAG 6FR PIGTAIL ANGLED (CATHETERS) ×1 IMPLANT
CLOSURE PERCLOSE PROSTYLE (VASCULAR PRODUCTS) ×2 IMPLANT
DILATOR VESSEL 10FR 20CM (INTRODUCER) ×1 IMPLANT
DILATOR VESSEL 38 20CM 12FR (INTRODUCER) ×1 IMPLANT
GUIDEWIRE INQWIRE 1.5J.035X260 (WIRE) ×1 IMPLANT
INQWIRE 1.5J .035X260CM (WIRE) ×2
KIT HEART LEFT (KITS) ×2 IMPLANT
KIT VERSACROSS LRG ACCESS (CATHETERS) ×1 IMPLANT
PACK CARDIAC CATHETERIZATION (CUSTOM PROCEDURE TRAY) ×2 IMPLANT
PAD PRO RADIOLUCENT 2001M-C (PAD) ×2 IMPLANT
SHEATH PERFORMER 16FR 30 (SHEATH) ×1 IMPLANT
SHEATH PINNACLE 8F 10CM (SHEATH) ×1 IMPLANT
SHEATH PROBE COVER 6X72 (BAG) ×2 IMPLANT
SHIELD RADPAD SCOOP 12X17 (MISCELLANEOUS) ×2 IMPLANT
TRANSDUCER W/STOPCOCK (MISCELLANEOUS) ×2 IMPLANT
TUBING CIL FLEX 10 FLL-RA (TUBING) ×2 IMPLANT

## 2021-02-22 NOTE — Progress Notes (Signed)
    The patient has been scheduled for follow up with [name] on 03/15/21 at 1:30pm. Groin site care placed in discharge instructions. See procedural note and medication plan with Dr. Quentin Ore.    Kathyrn Drown NP-C [place] Pager: [phone]

## 2021-02-22 NOTE — Discharge Instructions (Signed)
No driving for 2 days. No lifting over 5 lbs for 1 week. No sexual activity for 1 week. Keep procedure site clean & dry. If you notice increased pain, swelling, bleeding or pus, call/return!  You may shower, but no soaking baths/hot tubs/pools for 1 week.

## 2021-02-22 NOTE — Transfer of Care (Signed)
Immediate Anesthesia Transfer of Care Note  Patient: Levi [name]  Procedure(s) Performed: LEFT ATRIAL APPENDAGE OCCLUSION TRANSESOPHAGEAL ECHOCARDIOGRAM (TEE)  Patient Location: PACU and Cath Lab  Anesthesia Type:General  Level of Consciousness: drowsy, patient cooperative and responds to stimulation  Airway & Oxygen Therapy: Patient Spontanous Breathing  Post-op Assessment: Report given to RN and Post -op Vital signs reviewed and stable  Post vital signs: Reviewed and stable  Last Vitals:  Vitals Value Taken Time  BP 133/60 02/22/21 1135  Temp 36.4 C 02/22/21 1125  Pulse 64 02/22/21 1137  Resp 16 02/22/21 1137  SpO2 97 % 02/22/21 1137  Vitals shown include unvalidated device data.  Last Pain:  Vitals:   02/22/21 1125  TempSrc: Temporal  PainSc: 0-No pain      Patients Stated Pain Goal: 3 (24/26/83 4196)  Complications: No notable events documented.

## 2021-02-22 NOTE — Anesthesia Preprocedure Evaluation (Addendum)
Anesthesia Evaluation  Patient identified by MRN, date of birth, ID band Patient awake    Reviewed: Allergy & Precautions, NPO status , Patient's Chart, lab work & pertinent test results  Airway Mallampati: II  TM Distance: >3 FB Neck ROM: Full    Dental  (+) Caps   Pulmonary asthma , COPD,    Pulmonary exam normal        Cardiovascular hypertension, Pt. on medications and Pt. on home beta blockers +CHF  + dysrhythmias (on Eliquis) Atrial Fibrillation  Rhythm:Irregular Rate:Normal     Neuro/Psych  Headaches, negative psych ROS   GI/Hepatic Neg liver ROS, hiatal hernia, GERD  Medicated,  Endo/Other  negative endocrine ROS  Renal/GU negative Renal ROS  negative genitourinary   Musculoskeletal  (+) Arthritis , Osteoarthritis,    Abdominal (+)  Abdomen: soft.    Peds  Hematology negative hematology ROS (+)   Anesthesia Other Findings   Reproductive/Obstetrics                            Anesthesia Physical Anesthesia Plan  ASA: 3  Anesthesia Plan: General   Post-op Pain Management:    Induction: Intravenous  PONV Risk Score and Plan: 2 and Ondansetron, Dexamethasone and Treatment may vary due to age or medical condition  Airway Management Planned: Mask and Oral ETT  Additional Equipment: None  Intra-op Plan:   Post-operative Plan: Extubation in OR  Informed Consent: I have reviewed the patients History and Physical, chart, labs and discussed the procedure including the risks, benefits and alternatives for the proposed anesthesia with the patient or authorized representative who has indicated his/her understanding and acceptance.     Dental advisory given  Plan Discussed with: CRNA  Anesthesia Plan Comments: (ECHO 09/22: 1. Left ventricular ejection fraction, by estimation, is 45 to 50%. The  left ventricle has mildly decreased function. The left ventricle  demonstrates  global hypokinesis. There is mild concentric left ventricular  hypertrophy. Left ventricular diastolic  parameters are indeterminate.  2. Right ventricular systolic function is normal. The right ventricular  size is normal.  3. Left atrial size was mildly dilated.  4. The mitral valve is normal in structure. Trivial mitral valve  regurgitation. No evidence of mitral stenosis.  5. The aortic valve is tricuspid. Aortic valve regurgitation is trivial.  Mild aortic valve sclerosis is present, with no evidence of aortic valve  stenosis.  6. The inferior vena cava is normal in size with greater than 50%  respiratory variability, suggesting right atrial pressure of 3 mmHg.   Lab Results      Component                Value               Date                      WBC                      9.5                 02/20/2021                HGB                      13.2                02/20/2021  HCT                      39.1                02/20/2021                MCV                      95                  02/20/2021                PLT                      164                 02/20/2021           Lab Results      Component                Value               Date                      NA                       142                 01/23/2021                K                        3.6                 01/23/2021                CO2                      22                  01/23/2021                GLUCOSE                  95                  01/23/2021                BUN                      24                  01/23/2021                CREATININE               0.84                01/23/2021                CALCIUM                  9.9                 01/23/2021                EGFR  92                  01/23/2021                GFRNONAA                 >60                 09/13/2020          )        Anesthesia Quick Evaluation

## 2021-02-22 NOTE — Anesthesia Procedure Notes (Signed)
Procedure Name: Intubation Date/Time: 02/22/2021 10:21 AM Performed by: Bryson Corona, CRNA Pre-anesthesia Checklist: Patient identified, Emergency Drugs available, Suction available and Patient being monitored Patient Re-evaluated:Patient Re-evaluated prior to induction Oxygen Delivery Method: Circle System Utilized Preoxygenation: Pre-oxygenation with 100% oxygen Induction Type: IV induction Ventilation: Oral airway inserted - appropriate to patient size Laryngoscope Size: Glidescope and 4 Grade View: Grade I Tube type: Oral Tube size: 7.0 mm Number of attempts: 1 Airway Equipment and Method: Stylet and Oral airway Placement Confirmation: ETT inserted through vocal cords under direct vision, positive ETCO2 and breath sounds checked- equal and bilateral Secured at: 22 cm Tube secured with: Tape Dental Injury: Teeth and Oropharynx as per pre-operative assessment  Difficulty Due To: Difficulty was anticipated and Difficult Airway- due to reduced neck mobility Comments: Elective glidescope go intubation in EP 6 due to prior ACDF and pain in neck with movement. Grade 1 view. Atraumatic intubation.

## 2021-02-22 NOTE — Interval H&P Note (Signed)
History and Physical Interval Note:  02/22/2021 9:08 AM  Levi [name]  has presented today for surgery, with the diagnosis of atrial fibrillation.  The various methods of treatment have been discussed with the patient and family. After consideration of risks, benefits and other options for treatment, the patient has consented to  Procedure(s): LEFT ATRIAL APPENDAGE OCCLUSION (N/A) TRANSESOPHAGEAL ECHOCARDIOGRAM (TEE) (N/A) as a surgical intervention.  The patient's history has been reviewed, patient examined, no change in status, stable for surgery.  I have reviewed the patient's chart and labs.  Questions were answered to the patient's satisfaction.     Inanna Telford T Ovide Dusek

## 2021-02-22 NOTE — Anesthesia Postprocedure Evaluation (Signed)
Anesthesia Post Note  Patient: Levi [name]  Procedure(s) Performed: LEFT ATRIAL APPENDAGE OCCLUSION TRANSESOPHAGEAL ECHOCARDIOGRAM (TEE)     Patient location during evaluation: PACU Anesthesia Type: General Level of consciousness: awake and alert Pain management: pain level controlled Vital Signs Assessment: post-procedure vital signs reviewed and stable Respiratory status: spontaneous breathing, nonlabored ventilation, respiratory function stable and patient connected to nasal cannula oxygen Cardiovascular status: blood pressure returned to baseline and stable Postop Assessment: no apparent nausea or vomiting Anesthetic complications: no   No notable events documented.  Last Vitals:  Vitals:   02/22/21 1235 02/22/21 1247  BP: 134/60 126/71  Pulse: (!) 59 65  Resp: 19 (!) 22  Temp:    SpO2: 98% 97%    Last Pain:  Vitals:   02/22/21 1215  TempSrc:   PainSc: 0-No pain                 Belenda Cruise P Nancyann Cotterman

## 2021-02-23 ENCOUNTER — Encounter (HOSPITAL_COMMUNITY): Payer: Self-pay | Admitting: Cardiology

## 2021-02-23 LAB — ECHO TEE
AV Mean grad: 6 mmHg
AV Peak grad: 14.9 mmHg
Ao pk vel: 1.93 m/s

## 2021-02-24 ENCOUNTER — Other Ambulatory Visit: Payer: Self-pay | Admitting: Internal Medicine

## 2021-02-24 ENCOUNTER — Other Ambulatory Visit: Payer: Self-pay | Admitting: Cardiology

## 2021-02-26 ENCOUNTER — Telehealth: Payer: Self-pay | Admitting: Cardiology

## 2021-02-26 ENCOUNTER — Encounter (HOSPITAL_COMMUNITY): Payer: Self-pay | Admitting: Cardiology

## 2021-02-26 LAB — POCT ACTIVATED CLOTTING TIME: Activated Clotting Time: 242 seconds

## 2021-02-26 NOTE — Telephone Encounter (Signed)
  [facility]   Watchman Team  Contacted the patient regarding discharge from [facility] on 02/23/21. Procedure was aborted due to aneurysmal interatrial septum. Dr. Quentin Ore was unable to find suitable transseptal location therefore procedure was aborted as this was not felt to be safe.   The patient understands to follow up with [name] on 03/15/21.   The patient understands discharge instructions? Yes  The patient reports groin sites look good with no oozing, redness, or swelling   The patient understands to call with any questions or concerns prior to scheduled visit.    Kathyrn Drown NP-C Structural Heart Team  Pager: [phone]

## 2021-03-01 ENCOUNTER — Ambulatory Visit
Admission: RE | Admit: 2021-03-01 | Discharge: 2021-03-01 | Disposition: A | Discharge status: Home / Self Care | Payer: Medicare Other | Source: Ambulatory Visit | Attending: Neurological Surgery | Admitting: Neurological Surgery

## 2021-03-01 ENCOUNTER — Other Ambulatory Visit: Payer: Self-pay

## 2021-03-01 DIAGNOSIS — M4322 Fusion of spine, cervical region: Secondary | ICD-10-CM | POA: Diagnosis not present

## 2021-03-01 DIAGNOSIS — M4802 Spinal stenosis, cervical region: Secondary | ICD-10-CM | POA: Diagnosis not present

## 2021-03-01 DIAGNOSIS — M47812 Spondylosis without myelopathy or radiculopathy, cervical region: Secondary | ICD-10-CM | POA: Diagnosis not present

## 2021-03-01 DIAGNOSIS — M47813 Spondylosis without myelopathy or radiculopathy, cervicothoracic region: Secondary | ICD-10-CM | POA: Diagnosis not present

## 2021-03-01 DIAGNOSIS — M4722 Other spondylosis with radiculopathy, cervical region: Secondary | ICD-10-CM

## 2021-03-02 DIAGNOSIS — M4722 Other spondylosis with radiculopathy, cervical region: Secondary | ICD-10-CM | POA: Diagnosis not present

## 2021-03-05 ENCOUNTER — Telehealth: Payer: Self-pay | Admitting: *Deleted

## 2021-03-05 NOTE — Telephone Encounter (Signed)
DR. Reece Agar OFFICE FAXED OVER ANOTHER CLEARANCE STATING THAT NO ANESTHESIA TO BE USE. FOR PROCEDURE.

## 2021-03-05 NOTE — Telephone Encounter (Signed)
PT HAS APPT WITH [name] 03/15/21. WILL FORWARD NOTES TO MD FOR UPCOMING APPT. WILL SEND FYI TO REQUESTING OFFICE PT HAS APPT.    [place] Pre-operative Risk Assessment    Patient Name: Levi [name]  DOB: Aug 24, 1945 MRN: [id number]  HEARTCARE STAFF:  - IMPORTANT!!!!!! Under Visit Info/Reason for Call, type in Other and utilize the format Clearance MM/DD/YY or Clearance TBD. Do not use dashes or single digits. - Please review there is not already an duplicate clearance open for this procedure. - If request is for dental extraction, please clarify the # of teeth to be extracted. - If the patient is currently at the dentist's office, call Pre-Op Callback Staff (MA/nurse) to input urgent request.  - If the patient is not currently in the dentist office, please route to the Pre-Op pool.  Request for surgical clearance:  What type of surgery is being performed? TESI-T1-T2  When is this surgery scheduled? 27-Dec-2046  What type of clearance is required (medical clearance vs. Pharmacy clearance to hold med vs. Both)? BOTH  Are there any medications that need to be held prior to surgery and how long? ELIQUIS x DAYS PRIOR AND WILL RESUME BLOOD THINNER DAY AFTER PROCEDURE.   Practice name and name of physician performing surgery? [facility]; DR. Lenord Carbo  What is the office phone number? [phone]   7.   What is the office fax number? [phone]  8.   Anesthesia type (None, local, MAC, general) ? LEFT MESSAGE FOR OFFICE TO CALL BACK WITH ANESTHESIA    Julaine Hua 03/05/2021, 9:21 AM  _________________________________________________________________   (provider comments below)

## 2021-03-09 DIAGNOSIS — M2012 Hallux valgus (acquired), left foot: Secondary | ICD-10-CM | POA: Diagnosis not present

## 2021-03-13 NOTE — Telephone Encounter (Signed)
Our office received another duplicate request for clearance today. Pt has appt with [name] on 03/19/21. Once the pt has been seen by the cardiologist our office will be sure to fax over the clearance at that time. I will send FYI to surgeon's office we will fax clearance once the pt has been cleared. We have clearance on file, no need to send another clearance request.

## 2021-03-15 ENCOUNTER — Ambulatory Visit (INDEPENDENT_AMBULATORY_CARE_PROVIDER_SITE_OTHER): Payer: Medicare Other | Admitting: Cardiology

## 2021-03-15 ENCOUNTER — Other Ambulatory Visit: Payer: Self-pay

## 2021-03-15 ENCOUNTER — Encounter: Payer: Self-pay | Admitting: Cardiology

## 2021-03-15 DIAGNOSIS — I480 Paroxysmal atrial fibrillation: Secondary | ICD-10-CM | POA: Diagnosis not present

## 2021-03-15 DIAGNOSIS — J438 Other emphysema: Secondary | ICD-10-CM | POA: Diagnosis not present

## 2021-03-15 DIAGNOSIS — Z7901 Long term (current) use of anticoagulants: Secondary | ICD-10-CM | POA: Diagnosis not present

## 2021-03-15 NOTE — Assessment & Plan Note (Signed)
He states that prednisone was elevated for a while for his COPD and caused him to gain 20 pounds.  He is subsequently pulled back on this prednisone

## 2021-03-15 NOTE — Progress Notes (Signed)
Cardiology Office Note:    Date:  03/15/2021   ID:  [name], DOB 1946-02-20, MRN [id number]  PCP:  Shon Baton, MD   [facility] Providers Cardiologist:  Candee Furbish, MD Electrophysiologist:  Vickie Epley, MD     Referring MD: Shon Baton, MD     History of Present Illness:    Levi [name] is a 75 y.o. male here for paroxysmal atrial fibrillation follow-up, post watchman device attempt.  Watchman device was attempted however interatrial septum was highly redundant and transseptal puncture was too close to the outside wall of the left atrium to be safely performed.  Therefore watchman attempt was aborted.  He is doing well, groin is normal.  No excessive bruising or bleeding.  Continuing with Eliquis.  Past Medical History:  Diagnosis Date   Agent orange exposure 1970's   takes Imdur,Diltiazem, and Atacand daily   ALLERGIC RHINITIS    Allergy    seasonal allergies   Asthma    uses inhaler and nebulizers   Bruises easily    d/t prednisone daily   Cataract    CHF (congestive heart failure) (HCC)    dx-on meds to treat   Chronic bronchitis ([place])    "used to get it q yr; last time ?2008" (04/28/2012)   COPD (chronic obstructive pulmonary disease) (HCC)    agent orange exposure   Degenerative disk disease    "everywhere" (04/28/2012)   Diverticulitis    Dysrhythmia    afib   Elevated uric acid in blood    takes Allopurinol daily   Emphysema of lung ([place])    Enlarged prostate    but not on any meds   GERD (gastroesophageal reflux disease)    takes Protonix daily   H/O hiatal hernia    Headache    History of pneumonia    a. 2010   Hyperlipidemia    takes Lipitor daily; pt. states he takes a preventive   Joint pain    Joint swelling    Neuromuscular disorder ([place])    bilat legs, and bilat arms   PAF (paroxysmal atrial fibrillation) ([place])    a. Dx 04/2012, CHA2DS2VASc = 1 (age);  b. 04/2012 Echo: EF 40-50%, mild MR.   Personal history of colonic  adenomas 02/09/2013   Shortness of breath dyspnea    Thyroid disease    no taking meds-caused dizziness    Past Surgical History:  Procedure Laterality Date   ANTERIOR CERVICAL DECOMP/DISCECTOMY FUSION N/A 07/21/2012   Procedure: ANTERIOR CERVICAL DECOMPRESSION/DISCECTOMY FUSION 2 LEVELS;  Surgeon: [name], MD;  Location: [place] [facility];  Service: Neurosurgery;  Laterality: N/A;  C4-5 C5-6 Anterior cervical decompression/diskectomy/fusion   ANTERIOR LAT LUMBAR FUSION N/A 04/07/2017   Procedure: Thoracic twelve-Lumbar one Anterolateral decompression/fusion;  Surgeon: [name], MD;  Location: [facility];  Service: Neurosurgery;  Laterality: N/A;   BACK SURGERY     x 3   CARDIAC CATHETERIZATION  11/2008   [name] - 20% calcified non flow limiting left main, 50% EF apical hypokinesis   CERVICAL DISC ARTHROPLASTY N/A 04/07/2017   Procedure: Cervical six-seven Disc arthroplasty;  Surgeon: [name], MD;  Location: [place];  Service: Neurosurgery;  Laterality: N/A;   CHEST TUBE INSERTION Left 04/11/2017   Procedure: CHEST TUBE INSERTION;  Surgeon: [name], MD;  Location: [facility];  Service: Thoracic;  Laterality: Left;   CHOLECYSTECTOMY     COLONOSCOPY  2016   CG-MAC-miralax(good)servere TICS/TA x 2   ESOPHAGOGASTRODUODENOSCOPY  EYE SURGERY Bilateral 2011   steroidal encapsulation" (04/28/2012)   FOOT SURGERY Right    x 2   KNEE ARTHROSCOPY Bilateral    LATERAL / POSTERIOR COMBINED FUSION LUMBAR SPINE  2012   LEFT ATRIAL APPENDAGE OCCLUSION N/A 02/22/2021   Procedure: LEFT ATRIAL APPENDAGE OCCLUSION;  Surgeon: Vickie Epley, MD;  Location: [place] [facility];  Service: Cardiovascular;  Laterality: N/A;   POLYPECTOMY  2016   severe TICS/TA x 2   REVERSE SHOULDER ARTHROPLASTY  04/28/2012   Procedure: REVERSE SHOULDER ARTHROPLASTY;  Surgeon: [name], MD;  Location: [facility];  Service: Orthopedics;  Laterality: Left;  Left shouder reverse total shoulder  arthroplasty   SHOULDER SURGERY     TEE WITHOUT CARDIOVERSION N/A 02/22/2021   Procedure: TRANSESOPHAGEAL ECHOCARDIOGRAM (TEE);  Surgeon: Vickie Epley, MD;  Location: [place] [facility];  Service: Cardiovascular;  Laterality: N/A;   TENDON REPAIR Right 08/07/2020   Procedure: RIGHT HAND LIGAMENT RECONSTRUCTION AND TENDON INTERPOSITION;  Surgeon: [name], MD;  Location: [place];  Service: Orthopedics;  Laterality: Right;   [place]   TOTAL HIP ARTHROPLASTY Left 2011   "left" (04/28/2012)   TOTAL HIP ARTHROPLASTY Right 07/31/2015   Procedure: TOTAL HIP ARTHROPLASTY ANTERIOR APPROACH;  Surgeon: [name], MD;  Location: [facility];  Service: Orthopedics;  Laterality: Right;   TOTAL KNEE ARTHROPLASTY  01/10/2012   Procedure: TOTAL KNEE ARTHROPLASTY;  Surgeon: [name], MD;  Location: [facility];  Service: Orthopedics;;  left total knee arthroplasty   TOTAL KNEE ARTHROPLASTY Right 06/17/2014   Procedure: TOTAL KNEE ARTHROPLASTY;  Surgeon: [name], MD;  Location: [place];  Service: Orthopedics;  Laterality: Right;    Current Medications: Current Meds  Medication Sig   albuterol (VENTOLIN HFA) 108 (90 Base) MCG/ACT inhaler INHALE 2 PUFFS INTO THE LUNGS EVERY 4 (FOUR) HOURS AS NEEDED FOR WHEEZING OR SHORTNESS OF BREATH.   allopurinol (ZYLOPRIM) 100 MG tablet Take 100 mg by mouth daily.   apixaban (ELIQUIS) 5 MG TABS tablet Take 1 tablet (5 mg total) by mouth 2 (two) times daily.   atorvastatin (LIPITOR) 20 MG tablet TAKE 1 TABLET BY MOUTH EVERY DAY   candesartan (ATACAND) 4 MG tablet TAKE 1/2 TABLETS (2 MG TOTAL) BY MOUTH DAILY.   cyclobenzaprine (FLEXERIL) 10 MG tablet Take 10 mg by mouth at bedtime as needed for muscle spasms.   diltiazem (CARDIZEM CD) 180 MG 24 hr capsule TAKE 1 CAPSULE BY MOUTH EVERY DAY   diltiazem (CARDIZEM) 30 MG tablet Take 30 mg by mouth as needed (heart rate over 120).   furosemide (LASIX) 20 MG tablet Take 1 tablet  (20 mg total) by mouth daily as needed. For shortness of breath and lower limb edema   HYDROcodone-acetaminophen (NORCO/VICODIN) 5-325 MG tablet Take 1-2 tablets by mouth every 6 (six) hours as needed.   ipratropium-albuterol (DUONEB) 0.5-2.5 (3) MG/3ML SOLN 1 NEBULE EVERY 6 HOURS AS NEEDED. **J45.3**   isosorbide mononitrate (IMDUR) 30 MG 24 hr tablet TAKE 1 TABLET (30 MG TOTAL) BY MOUTH DAILY.   Nebulizers (COMPRESSOR/NEBULIZER) MISC Use as directed   omeprazole (PRILOSEC) 40 MG capsule Take 1 capsule (40 mg total) by mouth daily. (Patient taking differently: Take 40 mg by mouth 2 (two) times daily.)   predniSONE (DELTASONE) 10 MG tablet TAKE 2 TABLETS (20 MG TOTAL) BY MOUTH DAILY. CONTINUOUS   STRIVERDI RESPIMAT 2.5 MCG/ACT AERS INHALE 2 PUFFS BY MOUTH INTO THE LUNGS DAILY  tamsulosin (FLOMAX) 0.4 MG CAPS capsule Take 0.4 mg by mouth at bedtime.      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 There is no history of Esophageal cancer, Rectal cancer, Stomach cancer, Pancreatic cancer, Kidney disease, or Liver disease.  ROS:   Please see the history of present illness.     All other systems reviewed and are negative.  EKGs/Labs/Other Studies Reviewed:     Recent Labs: 02/22/2021: BUN 16; Creatinine, Ser 1.01; Hemoglobin 14.1; Platelets 164; Potassium 3.6; Sodium 139  Recent Lipid Panel    Component Value Date/Time   CHOL 188 09/28/2019 1017   TRIG 69 09/28/2019 1017   HDL 109 09/28/2019 1017   CHOLHDL 1.7 09/28/2019 1017   CHOLHDL 2 10/20/2014 1122   VLDL 12.0 10/20/2014 1122   LDLCALC 66 09/28/2019 1017     Risk Assessment/Calculations:          Physical Exam:    VS:  BP 118/69 (BP Location: Left Arm, Patient Position: Sitting, Cuff Size: Normal)   Pulse 79   Ht _0  (1.88 m)   Wt 179 lb 3.2 oz (81.3 kg)   BMI 23.01 kg/m     Wt Readings from Last 3 Encounters:  03/15/21 179 lb 3.2 oz (81.3 kg)  02/22/21 180 lb (81.6 kg)  01/23/21 180 lb (81.6 kg)     GEN:  Well nourished, well developed in no acute distress, tall HEENT: Normal NECK: No JVD; No carotid bruits LYMPHATICS: No lymphadenopathy CARDIAC: RRR, no murmurs, rubs, gallops RESPIRATORY:  Clear to auscultation without rales, wheezing or rhonchi  ABDOMEN: Soft, non-tender, non-distended MUSCULOSKELETAL:  No edema; No deformity  SKIN: Warm and dry NEUROLOGIC:  Alert and oriented x 3 PSYCHIATRIC:  Normal affect   ASSESSMENT:    1. Paroxysmal atrial fibrillation (HCC)   2. Chronic anticoagulation   3. Other emphysema ([place])    PLAN:    In order of problems listed above:  Atrial fibrillation ([place]) EF remains 45%.  Back in 2013 was  40 to 50%.  No significant change.  Very rare episodes.  Continuing with diltiazem.  On Eliquis as well.  No changes made.  Not able to implant watchman due to highly redundant/flexible interatrial septum.  Chronic anticoagulation Continue with Eliquis 5 mg twice a day.  No changes made.  Excellent lab work.  Continue with current medical management.  COPD (chronic obstructive pulmonary disease) ([place]) He states that prednisone was elevated for a while for his COPD and caused him to gain 20 pounds.  He is subsequently pulled back on this prednisone        Medication Adjustments/Labs and Tests Ordered: Current medicines are reviewed at length with the patient today.  Concerns regarding medicines are outlined above.  No orders of the defined types were placed in this encounter.  No orders of the defined types were placed in this encounter.   Patient Instructions  Medication Instructions:  The current medical regimen is effective;  continue present plan and medications.  *If you need a refill on your cardiac medications before your next appointment, please call your pharmacy*  Follow-Up: At [facility], you and your health needs are our priority.  As part of our continuing mission to provide you with exceptional heart care, we have created designated Provider Care Teams.  These Care Teams include your primary Cardiologist (physician) and Advanced Practice Providers (APPs -  Physician Assistants and Nurse Practitioners) who all work together to provide you with the care you need, when you need it.  We recommend signing up for the patient portal called "MyChart".  Sign up information is provided on this After Visit Summary.  MyChart is used to connect with patients for Virtual Visits (Telemedicine).  Patients are able to view lab/test results, encounter notes, upcoming appointments, etc.  Non-urgent messages can be sent to your provider as well.   To learn more about what you can do with  MyChart, go to [email or web address].    Your next appointment:   6 month(s)  The format for your next appointment:   In Person  Provider:   Candee Furbish, MD   Thank you for choosing [facility]!!     Signed, Candee Furbish, MD  03/15/2021 2:20 PM    [facility]

## 2021-03-15 NOTE — Assessment & Plan Note (Signed)
EF remains 45%.  Back in 2013 was 40 to 50%.  No significant change.  Very rare episodes.  Continuing with diltiazem.  On Eliquis as well.  No changes made.  Not able to implant watchman due to highly redundant/flexible interatrial septum.

## 2021-03-15 NOTE — Assessment & Plan Note (Signed)
Continue with Eliquis 5 mg twice a day.  No changes made.  Excellent lab work.  Continue with current medical management.

## 2021-03-15 NOTE — Patient Instructions (Signed)
Medication Instructions:  ?The current medical regimen is effective;  continue present plan and medications. ? ?*If you need a refill on your cardiac medications before your next appointment, please call your pharmacy* ? ?Follow-Up: ?At [facility], you and your health needs are our priority.  As part of our continuing mission to provide you with exceptional heart care, we have created designated Provider Care Teams.  These Care Teams include your primary Cardiologist (physician) and Advanced Practice Providers (APPs -  Physician Assistants and Nurse Practitioners) who all work together to provide you with the care you need, when you need it. ? ?We recommend signing up for the patient portal called "MyChart".  Sign up information is provided on this After Visit Summary.  MyChart is used to connect with patients for Virtual Visits (Telemedicine).  Patients are able to view lab/test results, encounter notes, upcoming appointments, etc.  Non-urgent messages can be sent to your provider as well.   ?To learn more about what you can do with MyChart, go to [email or web address].   ? ?Your next appointment:   ?6 month(s) ? ?The format for your next appointment:   ?In Person ? ?Provider:   ?Mark Skains, MD   ? ?Thank you for choosing [facility]!! ? ? ? ?

## 2021-03-16 NOTE — Telephone Encounter (Signed)
Ok to proceed with spinal injection  Hold Eliquis for 3 days prior to procedure. Restart 2 days post procedure due to high risk if bleeding complication occurs.   [place] risk should be low for short hold and patient can not undergo spinal injection without holding Eliquis.   Thanks  Candee Furbish, MD

## 2021-03-16 NOTE — Telephone Encounter (Signed)
   Primary Cardiologist: Candee Furbish, MD  Chart reviewed as part of pre-operative protocol coverage. Given past medical history and time since last visit, based on ACC/AHA guidelines, Levi [name] would be at acceptable risk for the planned procedure without further cardiovascular testing.   Per [name]  "Ok to proceed with spinal injection   Hold Eliquis for 3 days prior to procedure. Restart 2 days post procedure due to high risk if bleeding complication occurs.    Stoke risk should be low for short hold and patient can not undergo spinal injection without holding Eliquis."  I will route this recommendation to the requesting party via [facility] fax function and remove from pre-op pool.  Please call with questions.  Jossie Ng. Elenore Wanninger NP-C    03/16/2021, 7:25 AM [place] Office [phone] Fax [phone]

## 2021-03-19 DIAGNOSIS — M5414 Radiculopathy, thoracic region: Secondary | ICD-10-CM | POA: Diagnosis not present

## 2021-03-20 ENCOUNTER — Telehealth: Payer: Self-pay | Admitting: Gastroenterology

## 2021-03-20 NOTE — Telephone Encounter (Signed)
Inbound call from pt stating that he needs a prescription for fluconazole. Please advise. Thank you.

## 2021-03-21 MED ORDER — FLUCONAZOLE 100 MG PO TABS: ORAL_TABLET | ORAL | 0 refills | Status: DC

## 2021-03-21 NOTE — Telephone Encounter (Addendum)
Notified the patient rx was sent to [facility]

## 2021-03-21 NOTE — Telephone Encounter (Signed)
Contacted the patient to discuss his request for fluconazole. The patient states he feels as if he has a ball in his throat and it hurts to swallow. I explained to him that I would get with Dr Bryan Lemma and ask for his advice on the prescription.

## 2021-03-21 NOTE — Telephone Encounter (Signed)
He does have a history of esophageal candidiasis, previously treated with fluconazole.  If symptoms feel like they had in the past when he was diagnosed with candidiasis, not unreasonable to treat with course of fluconazole 200 mg x 1 then 100 mg daily x20 days.  Provide with 100 mg tablets, #22, RF 0.  Can follow-up with me as scheduled and will evaluate for clinical response.  Thank you.

## 2021-04-05 ENCOUNTER — Other Ambulatory Visit: Payer: Self-pay | Admitting: Cardiology

## 2021-04-05 NOTE — Progress Notes (Signed)
Chief Complaint  Patient presents with   Follow-up    Rm 2, alone. Pt reports doing well. Pt reports an episode of syncope. Reports everything looked slanted. Has had 2 other episode of near syncope.     HISTORY OF PRESENT ILLNESS: 04/09/21 ALL:  Levi [name] returns for follow up. We increased gabapentin to 32m TID and started compounded neuropathy cream at last visit 11/2020. He stopped gabapentin as it was not effective. He reports that [facility] called him but never sent cream. He does not wish to resend orders. He reports neuropathy pain waxes and wanes. He continues to have regular falls. Fortunately, no injuries. He uses a cane most all the time except for when he is at home. Falls have occurred when he is not using his cane. He has a walker but doesn't like to use it. PT has not been helpful. He continues to [facility]. He is scheduled to return 05/09/2021 to discuss treatment for broken screw in his neck. He denies syncope since last being seen. Only one true syncopal event when seen by Dr ARexene Alberts He has had 1-2 near syncopal events, last event was 2 weeks ago. Symptoms were very short lived, a minute or so. He reports blurry vision while driving. He felt that everything was slanted. He denies double vision. He states BP has been fluctuating from 80/60's to 150's/80's. He occasionally has dizziness with standing. He is on multiple cardiac medications. He is s/p failed watchman procedure. Last saw cardiology 10/20 who advised to continue Eliquis.   12/20/2020 ALL:  Levi [name] a 75y.o. male here today for follow up for bilateral leg pain. NCS/EMG testing shows chronic neuropathy. He was started on gabapentin 1069mdaily and advised to titrate to TID dosing. He has taken 30065maily at bedtime. He is uncertain if medicaiton as been helpful. He states that he was previously on gabapentin 300m85mD and that seemed to work better. Burning pain continues to be fairly constant.  Worse with activity and at night. He continues to [facility] for nerve blocks and ESI. He is beings scheduled for an MRI of cervical spine in the next couple of week. He has severe arthritis of right foot. He was seen by podiatry and told it would take multiple surgeries over 5-6 years. He is on prednisone. He has had a total of 43 surgeries. He is currently being considered for insertion of Watchman.   HISTORY (copied from Dr AthaGuadelupe Sabinvious note)  Mr. KerbStairsa 74 y29r old right-handed gentleman with an underlying  Complex medical history of asthma, A. fib, hyperlipidemia, reflux disease with hiatal hernia, diverticulitis, degenerative disc disease, COPD, allergic rhinitis, history of agent orange exposure in the 70s, carpal tunnel syndrome, status post bilateral carpal tunnel release and ulnar release surgeries in January and February of this year, status post multiple surgeries including lumbar spine surgery, neck surgery, bilateral hip replacements, bilateral knee replacements, shoulder surgeries, who Presents for follow-up consultation of his neuropathy.  I saw him at the request of Dr. ElsnEllene Route5/27/2021, at which time the patient reported a longstanding history of numbness and tingling and pain in both lower extremities.  Symptoms were more on the right.   He had extensive blood work on 10/21/2019.  A1c was 5.5, ANA was negative, CRP was less than 1, rheumatoid factor was less than 10, CK was 59, heavy metals in the blood was negative, CMP showed glucose of 115, BUN 27, creatinine 0.88,  AST 19, ALT 15, TSH was mildly elevated at 5.33, multiple myeloma panel showed decreased in immunoglobulins with a pattern suggestive of hypogammaglobulinemia, RPR nonreactive, vitamin B12 low at 224, thiamine 206.2.  He was advised to seek consultation with hematology.  He was furthermore advised to follow-up with his primary care for B12 deficiency and abnormal TSH.   He had an EMG and nerve  conduction velocity test through our office on 11/17/2019 and I reviewed the results:   Conclusion: This is an abnormal study.  There is electrodiagnostic evidence of mild bilateral lumbosacral radiculopathy, mainly involving bilateral L4, L5, S1 myotomes, there is no evidence of active process.  In addition, there is evidence of mild length dependent axonal sensorimotor polyneuropathy.   We called him with his test results.  He saw hematology in July 2021, hypogammaglobulinemia was not considered to be significant at the time.  He did not have any history of recurrent infections.  He was not felt to need a bone marrow biopsy.   Today, 09/20/2020: He reports ongoing issues with leg pain, right more than left.  It has not really progressed since last time but he has additional joint pains including both hips, sometimes he takes Flexeril at bedtime.  He does report that his balance is off and he has had falls, as recent as 2 days ago.  He fell in the yard.  He has a walker but typically only uses his four-point cane.  He tries to hydrate with water.  He reports a recent brief episode of A. fib, he reports that he can tell when he is in A. fib.  He is due for an eye exam.  He had a recent episode of dizziness and lightheadedness and had double vision which lasted about 10 minutes.  He has an eye examination yearly. He had an interim ER visit on 09/13/2020 for dizziness, lightheadedness.  I reviewed the emergency room records.  He had a head CT without contrast on 09/13/2020 and I reviewed the results: IMPRESSION: Mild atrophic changes without acute abnormality.  He was felt to have orthostasis.   He had recent right hand surgery on 08/07/2020 under Dr. [name].  He had right hand ligament reconstruction and tendon interposition.  Pain is improved and mobility is better since surgery he reports.  He is not taking any oxycodone at this time.  Does take Flexeril as needed, typically only once at  bedtime.   REVIEW OF SYSTEMS: Out of a complete 14 system review of symptoms, the patient complains only of the following symptoms, chronic pain, numbness/tingling, and all other reviewed systems are negative.   ALLERGIES: Allergies  Allergen Reactions   Morphine And Related Palpitations and Other (See Comments)    Made patient light-headed and sweaty; "my heart races" (04/28/2012), pt states too much morphine gave him this reaction   Levaquin [Levofloxacin] Itching     HOME MEDICATIONS: Outpatient Medications Prior to Visit  Medication Sig Dispense Refill   albuterol (VENTOLIN HFA) 108 (90 Base) MCG/ACT inhaler INHALE 2 PUFFS INTO THE LUNGS EVERY 4 (FOUR) HOURS AS NEEDED FOR WHEEZING OR SHORTNESS OF BREATH. 18 each 12   allopurinol (ZYLOPRIM) 100 MG tablet Take 100 mg by mouth daily.     apixaban (ELIQUIS) 5 MG TABS tablet Take 1 tablet (5 mg total) by mouth 2 (two) times daily. 60 tablet 6   atorvastatin (LIPITOR) 20 MG tablet TAKE 1 TABLET BY MOUTH EVERY DAY 90 tablet 3   candesartan (ATACAND) 4 MG  tablet TAKE 1/2 TABLETS (2 MG TOTAL) BY MOUTH DAILY. 45 tablet 3   cyclobenzaprine (FLEXERIL) 10 MG tablet Take 10 mg by mouth at bedtime as needed for muscle spasms.     diltiazem (CARDIZEM CD) 180 MG 24 hr capsule TAKE 1 CAPSULE BY MOUTH EVERY DAY 90 capsule 3   diltiazem (CARDIZEM) 30 MG tablet Take 30 mg by mouth as needed (heart rate over 120).     furosemide (LASIX) 20 MG tablet Take 1 tablet (20 mg total) by mouth daily as needed. For shortness of breath and lower limb edema 90 tablet 3   ipratropium-albuterol (DUONEB) 0.5-2.5 (3) MG/3ML SOLN 1 NEBULE EVERY 6 HOURS AS NEEDED. **J45.3** 270 mL 4   isosorbide mononitrate (IMDUR) 30 MG 24 hr tablet TAKE 1 TABLET (30 MG TOTAL) BY MOUTH DAILY. 30 tablet 11   Nebulizers (COMPRESSOR/NEBULIZER) MISC Use as directed 1 each 0   omeprazole (PRILOSEC) 40 MG capsule Take 1 capsule (40 mg total) by mouth daily. (Patient taking differently: Take 40  mg by mouth 2 (two) times daily.) 90 capsule 1   predniSONE (DELTASONE) 10 MG tablet TAKE 2 TABLETS (20 MG TOTAL) BY MOUTH DAILY. CONTINUOUS 30 tablet 5   STRIVERDI RESPIMAT 2.5 MCG/ACT AERS INHALE 2 PUFFS BY MOUTH INTO THE LUNGS DAILY 4 g 4   tamsulosin (FLOMAX) 0.4 MG CAPS capsule Take 0.4 mg by mouth at bedtime.   11   fluconazole (DIFLUCAN) 100 MG tablet Take 200 mg for your 1st dose then 100 mg daily thereafter. 22 tablet 0   HYDROcodone-acetaminophen (NORCO/VICODIN) 5-325 MG tablet Take 1-2 tablets by mouth every 6 (six) hours as needed.     NONFORMULARY OR COMPOUNDED ITEM Apply 1-2 g topically. Compounded cream for neuropathy: amantadine 8%, baclofen 2%, gabapentin 6%, amitriptyline 4%, bupivacaine 2%, clonidine 0.2% Directions: Apply 3-4 times daily. Order sent to [facility] (Patient not taking: Reported on 03/15/2021)     No facility-administered medications prior to visit.     PAST MEDICAL HISTORY: Past Medical History:  Diagnosis Date   Agent orange exposure 1970's   takes Imdur,Diltiazem, and Atacand daily   ALLERGIC RHINITIS    Allergy    seasonal allergies   Asthma    uses inhaler and nebulizers   Bruises easily    d/t prednisone daily   Cataract    CHF (congestive heart failure) ([place])    dx-on meds to treat   Chronic bronchitis ([place])    "used to get it q yr; last time ?2008" (04/28/2012)   COPD (chronic obstructive pulmonary disease) ([facility])    agent orange exposure   Degenerative disk disease    "everywhere" (04/28/2012)   Diverticulitis    Dysrhythmia    afib   Elevated uric acid in blood    takes Allopurinol daily   Emphysema of lung ([place])    Enlarged prostate    but not on any meds   GERD (gastroesophageal reflux disease)    takes Protonix daily   H/O hiatal hernia    Headache    History of pneumonia    a. 2010   Hyperlipidemia    takes Lipitor daily; pt. states he takes a preventive   Joint pain    Joint swelling    Neuromuscular  disorder ([place])    bilat legs, and bilat arms   PAF (paroxysmal atrial fibrillation) ([place])    a. Dx 04/2012, CHA2DS2VASc = 1 (age);  b. 04/2012 Echo: EF 40-50%, mild MR.   Personal history  of colonic adenomas 02/09/2013   Shortness of breath dyspnea    Thyroid disease    no taking meds-caused dizziness     PAST SURGICAL HISTORY: Past Surgical History:  Procedure Laterality Date   ANTERIOR CERVICAL DECOMP/DISCECTOMY FUSION N/A 07/21/2012   Procedure: ANTERIOR CERVICAL DECOMPRESSION/DISCECTOMY FUSION 2 LEVELS;  Surgeon: [name], MD;  Location: [facility];  Service: Neurosurgery;  Laterality: N/A;  C4-5 C5-6 Anterior cervical decompression/diskectomy/fusion   ANTERIOR LAT LUMBAR FUSION N/A 04/07/2017   Procedure: Thoracic twelve-Lumbar one Anterolateral decompression/fusion;  Surgeon: [name], MD;  Location: [facility];  Service: Neurosurgery;  Laterality: N/A;   BACK SURGERY     x 3   CARDIAC CATHETERIZATION  11/2008   [name] - 20% calcified non flow limiting left main, 50% EF apical hypokinesis   CERVICAL DISC ARTHROPLASTY N/A 04/07/2017   Procedure: Cervical six-seven Disc arthroplasty;  Surgeon: [name], MD;  Location: [facility];  Service: Neurosurgery;  Laterality: N/A;   CHEST TUBE INSERTION Left 04/11/2017   Procedure: CHEST TUBE INSERTION;  Surgeon: [name], MD;  Location: [facility];  Service: Thoracic;  Laterality: Left;   CHOLECYSTECTOMY     COLONOSCOPY  2016   CG-MAC-miralax(good)servere TICS/TA x 2   ESOPHAGOGASTRODUODENOSCOPY     EYE SURGERY Bilateral 2011   steroidal encapsulation" (04/28/2012)   FOOT SURGERY Right    x 2   KNEE ARTHROSCOPY Bilateral    LATERAL / POSTERIOR COMBINED FUSION LUMBAR SPINE  2012   LEFT ATRIAL APPENDAGE OCCLUSION N/A 02/22/2021   Procedure: LEFT ATRIAL APPENDAGE OCCLUSION;  Surgeon: Vickie Epley, MD;  Location: [facility];  Service: Cardiovascular;  Laterality: N/A;   POLYPECTOMY  2016   severe TICS/TA x 2    REVERSE SHOULDER ARTHROPLASTY  04/28/2012   Procedure: REVERSE SHOULDER ARTHROPLASTY;  Surgeon: [name], MD;  Location: [facility];  Service: Orthopedics;  Laterality: Left;  Left shouder reverse total shoulder arthroplasty   SHOULDER SURGERY     TEE WITHOUT CARDIOVERSION N/A 02/22/2021   Procedure: TRANSESOPHAGEAL ECHOCARDIOGRAM (TEE);  Surgeon: Vickie Epley, MD;  Location: [facility];  Service: Cardiovascular;  Laterality: N/A;   TENDON REPAIR Right 08/07/2020   Procedure: RIGHT HAND LIGAMENT RECONSTRUCTION AND TENDON INTERPOSITION;  Surgeon: [name], MD;  Location: [facility];  Service: Orthopedics;  Laterality: Right;   [place]   TOTAL HIP ARTHROPLASTY Left 2011   "left" (04/28/2012)   TOTAL HIP ARTHROPLASTY Right 07/31/2015   Procedure: TOTAL HIP ARTHROPLASTY ANTERIOR APPROACH;  Surgeon: [name], MD;  Location: [facility];  Service: Orthopedics;  Laterality: Right;   TOTAL KNEE ARTHROPLASTY  01/10/2012   Procedure: TOTAL KNEE ARTHROPLASTY;  Surgeon: [name], MD;  Location: [facility];  Service: Orthopedics;;  left total knee arthroplasty   TOTAL KNEE ARTHROPLASTY Right 06/17/2014   Procedure: TOTAL KNEE ARTHROPLASTY;  Surgeon: [name], MD;  Location: [facility];  Service: Orthopedics;  Laterality: Right;     FAMILY HISTORY: Family History  Problem Relation Age of Onset   Heart disease Father        Died 74, MI   Diabetes Mother    Prostate cancer Brother    Colon polyps Brother    Colon polyps Brother    Colon cancer Other    Esophageal cancer Neg Hx    Rectal cancer Neg Hx    Stomach cancer Neg Hx    Pancreatic cancer Neg Hx  Kidney disease Neg Hx    Liver disease Neg Hx      SOCIAL HISTORY: Social History   Socioeconomic History   Marital status: Married    Spouse name: Not on file   Number of children: 1   Years of education: Not on file   Highest education level: Not on file  Occupational  History   Occupation: Retired    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   Generalized: Well developed, in no acute distress  Cardiology: normal rate and rhythm, no murmur auscultated  Respiratory: clear to auscultation bilaterally    Neurological examination  Mentation: Alert oriented to time, place, history taking. Follows all commands speech and language fluent Cranial nerve II-XII: Pupils were equal round reactive to light. Extraocular movements were full, visual field were full on confrontational test. Facial sensation and strength were normal. Uvula tongue midline. Head turning and shoulder shrug  were normal and symmetric. Motor: The motor testing reveals 4-4+ over 5 strength of all 4 extremities with exception of R lower hip flexion and dorsiflexion 4/5.  Sensory: Sensory testing is decreased to soft and pinprick testing of bilateral lower extremities Coordination: Cerebellar testing reveals good finger-nose-finger, unable to perform heel  to shin bilaterally Gait and station: He pushes with some difficulty to standing position. Gait is slow and cautious. Stable with cane. Tandem not attempted.  Reflexes: Deep tendon reflexes are symmetric and normal bilaterally.    DIAGNOSTIC DATA (LABS, IMAGING, TESTING) - I reviewed patient records, labs, notes, testing and imaging myself where available.  Lab Results  Component Value Date   WBC 7.8 02/22/2021   HGB 14.1 02/22/2021   HCT 43.3 02/22/2021   MCV 99.8 02/22/2021   PLT 164 02/22/2021      Component Value Date/Time   NA 139 02/22/2021 0749   NA 142 01/23/2021 1001   K 3.6 02/22/2021 0749   CL 107 02/22/2021 0749   CO2 21 (L) 02/22/2021 0749   GLUCOSE 88 02/22/2021 0749   BUN 16 02/22/2021 0749   BUN 24 01/23/2021 1001   CREATININE 1.01 02/22/2021 0749   CREATININE 0.90 02/28/2020 1440   CALCIUM 9.5 02/22/2021 0749   PROT 5.8 (L) 02/28/2020 1440   PROT 5.9 (L) 10/21/2019 0941   ALBUMIN 3.9 02/28/2020 1440   ALBUMIN 4.1 10/21/2019 0941   AST 19 02/28/2020 1440  ALT 15 02/28/2020 1440   ALKPHOS 53 02/28/2020 1440   BILITOT 0.6 02/28/2020 1440   GFRNONAA >60 02/22/2021 0749   GFRNONAA >60 02/28/2020 1440   GFRAA >60 02/28/2020 1440   Lab Results  Component Value Date   CHOL 188 09/28/2019   HDL 109 09/28/2019   LDLCALC 66 09/28/2019   TRIG 69 09/28/2019   CHOLHDL 1.7 09/28/2019   Lab Results  Component Value Date   HGBA1C 5.5 10/21/2019   Lab Results  Component Value Date   VITAMINB12 224 (L) 10/21/2019   Lab Results  Component Value Date   TSH 5.330 (H) 10/21/2019    No flowsheet data found.   No flowsheet data found.   ASSESSMENT AND PLAN  75 y.o. year old male  has a past medical history of Agent orange exposure (1970's), ALLERGIC RHINITIS, Allergy, Asthma, Bruises easily, Cataract, CHF (congestive heart failure) ([facility]), Chronic bronchitis ([facility]), COPD (chronic obstructive pulmonary disease) ([place]), Degenerative disk disease,  Diverticulitis, Dysrhythmia, Elevated uric acid in blood, Emphysema of lung ([facility]), Enlarged prostate, GERD (gastroesophageal reflux disease), H/O hiatal hernia, Headache, History of pneumonia, Hyperlipidemia, Joint pain, Joint swelling, Neuromuscular disorder ([place]), PAF (paroxysmal atrial fibrillation) ([place]), Personal history of colonic adenomas (02/09/2013), Shortness of breath dyspnea, and Thyroid disease. here with    Sensorimotor neuropathy  Multifactorial gait disorder  Recurrent falls  Levi [name] did not feel gabapentin helped and discontinued this medication. We will try him on duloxetine. I am starting a low dose of 79m daily but anticipate need to increase dose. He will call if well tolerated and we will plan to increase dose to 641min 4-6 weeks. We have discussed lightheadedness. I am concerned that events are cardiac in nature. He states BP has been labile. I have asked him to discuss with PCP and ensure that he is drinking plenty of fluids daily. May consider repeat TSH as he was treated for hypothyroidism but medication stopped due to side effects about a year ago. He has changed PCP providers since and unclear if labs were rechecked. CBC and BMP unremarkable in 01/2021. Fall precautions reviewed. He was encouraged to use cane at all times. Consider walker if he needs more stability. He declines PT referral. He will continue close follow up with PCP, cardiology and neurosurgery. I will have him return for follow up with Dr AtRexene Albertsn 4-6 months. He verbalizes understanding and agreement with this plan.    No orders of the defined types were placed in this encounter.    Meds ordered this encounter  Medications   DULoxetine (CYMBALTA) 30 MG capsule    Sig: Take 1 capsule (30 mg total) by mouth daily.    Dispense:  90 capsule    Refill:  3    Order Specific Question:   Supervising Provider    Answer:   AHMelvenia Beam1[[id number]]      TIR WERXVMSN, FNP-C 04/09/2021, 3:34 PM  [facility] [address]SuBeallsvillerNi[address] University[place] [id number]

## 2021-04-05 NOTE — Patient Instructions (Signed)
Below is our plan:  We will start duloxetine 30mg  daily. We will anticipate increasing dose to 60mg  in 4-6 weeks if well tolerated. Please monitor presyncopal symptoms closely. Follow up with Dr Virgina Jock next month.   Please make sure you are staying well hydrated. I recommend 50-60 ounces daily. Well balanced diet and regular exercise encouraged. Consistent sleep schedule with 6-8 hours recommended.   Please continue follow up with care team as directed.   Follow up with Dr [name] in 4 months.   You may receive a survey regarding today's visit. I encourage you to leave honest feed back as I do use this information to improve patient care. Thank you for seeing me today!

## 2021-04-09 ENCOUNTER — Encounter: Payer: Self-pay | Admitting: Family Medicine

## 2021-04-09 ENCOUNTER — Ambulatory Visit (INDEPENDENT_AMBULATORY_CARE_PROVIDER_SITE_OTHER): Payer: Medicare Other | Admitting: Family Medicine

## 2021-04-09 VITALS — BP 151/76 | HR 65 | Ht 74.0 in | Wt 180.5 lb

## 2021-04-09 DIAGNOSIS — R296 Repeated falls: Secondary | ICD-10-CM | POA: Diagnosis not present

## 2021-04-09 DIAGNOSIS — R2689 Other abnormalities of gait and mobility: Secondary | ICD-10-CM

## 2021-04-09 DIAGNOSIS — G629 Polyneuropathy, unspecified: Secondary | ICD-10-CM | POA: Diagnosis not present

## 2021-04-09 MED ORDER — DULOXETINE HCL 30 MG PO CPEP: 30.0000 mg | ORAL_CAPSULE | Freq: Every day | ORAL | 3 refills | Status: DC

## 2021-04-11 ENCOUNTER — Other Ambulatory Visit: Payer: Self-pay

## 2021-04-11 ENCOUNTER — Ambulatory Visit (INDEPENDENT_AMBULATORY_CARE_PROVIDER_SITE_OTHER): Payer: Medicare Other | Admitting: Gastroenterology

## 2021-04-11 ENCOUNTER — Encounter: Payer: Self-pay | Admitting: Gastroenterology

## 2021-04-11 VITALS — BP 110/80 | HR 103 | Ht 74.0 in | Wt 180.0 lb

## 2021-04-11 DIAGNOSIS — I480 Paroxysmal atrial fibrillation: Secondary | ICD-10-CM

## 2021-04-11 DIAGNOSIS — Z8709 Personal history of other diseases of the respiratory system: Secondary | ICD-10-CM | POA: Diagnosis not present

## 2021-04-11 DIAGNOSIS — R1310 Dysphagia, unspecified: Secondary | ICD-10-CM | POA: Diagnosis not present

## 2021-04-11 DIAGNOSIS — K219 Gastro-esophageal reflux disease without esophagitis: Secondary | ICD-10-CM | POA: Diagnosis not present

## 2021-04-11 DIAGNOSIS — Z7901 Long term (current) use of anticoagulants: Secondary | ICD-10-CM

## 2021-04-11 DIAGNOSIS — Z86010 Personal history of colonic polyps: Secondary | ICD-10-CM | POA: Diagnosis not present

## 2021-04-11 NOTE — Progress Notes (Signed)
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  Longstanding history of GERD with index symptoms of heartburn, regurgitation, waterbrash.  Symptoms can occur throughout the day, and rarely nocturnal symptoms.   Endoscopic Hx: -Colonoscopy in 01/2013 with 3 small tubular adenomas. -Colonoscopy in 01/2015 with 1 small tubular adenoma, with recommendation to repeat in 5 years -EGD (02/2019): Esophageal candidiasis (bxs negative though), benign distal esophageal stenosis (nondilated).  Treated with fluconazole -Colonoscopy (03/2020, Dr. Bryan Lemma): 6 tubular adenomas, pandiverticulosis, internal hemorrhoids.  Fair prep.  Repeat in 1 year - EGD (05/2020, Dr. Bryan Lemma): Benign stenosis at 75 cm dilated with 54Fr Venia Minks with mucosal disruption at 18 cm consistent with proximal stricture, esophageal candidiasis, mild gastritis - EGD (12/2020, Dr. Bryan Lemma): Normal esophagus, mild non-H. pylori gastritis, benign gastric polyp    HPI:     Patient is a 75 y.o. male presenting to the Gastroenterology Clinic for follow-up.  Last seen by me on 12/25/2020 with recurrence of odynophagia and epigastric pain x6 weeks.  Did have some minimal improvement with fluconazole in the summer, and does have multiple inhalers and a nebulizer, along with prednisone 10 mg/day x30 years.  Since then, completed repeat upper endoscopy on 01/11/2021 as outlined above.  Has been drinking a cup of water after each nebulizer/inhaler use.  Attempted watchman device implantation on 9/29, but unable to find a suitable transseptal location due to aneurysmal interatrial septum.  TEE without thrombus.  Did have a recent course of  fluconazole in late October, but feels that symptoms did not improve too much despite completing 21 days.  He continues to have multiple episodes of pain throughout the day, pointing from mid epigastrium to upper sternum.  Some heartburn component. Still taking omeprazole daily.   Started cymbalta yesterday for neuropathy.   Review of systems:     No chest pain, no SOB, no fevers, no urinary sx   Past Medical History:  Diagnosis Date  . Agent orange exposure 1970's   takes Imdur,Diltiazem, and Atacand daily  . ALLERGIC RHINITIS   . Allergy    seasonal allergies  . Asthma    uses inhaler and nebulizers  . Bruises easily    d/t prednisone daily  . Cataract   . CHF (congestive heart failure) ([place])    dx-on meds to treat  . Chronic bronchitis ([place])    "used to get it q yr; last time ?2008" (04/28/2012)  . COPD (chronic obstructive pulmonary disease) (H[facility])    agent orange exposure  . Degenerative disk disease    "everywhere" (04/28/2012)  . Diverticulitis   . Dysrhythmia    afib  . Elevated uric acid in blood    takes Allopurinol daily  . Emphysema of lung ([place])   . Enlarged prostate    but not on any meds  . GERD (gastroesophageal reflux disease)    takes Protonix daily  . H/O hiatal hernia   . Headache   . History of pneumonia    a. 2010  . Hyperlipidemia    takes Lipitor daily; pt. states he takes a preventive  . Joint pain   . Joint swelling   . Neuromuscular disorder (HCC)    bilat legs, and bilat arms  . PAF (paroxysmal atrial  fibrillation) ([place])    a. Dx 04/2012, CHA2DS2VASc = 1 (age);  b. 04/2012 Echo: EF 40-50%, mild MR.  . Personal history of colonic adenomas 02/09/2013  . Shortness of breath dyspnea   . Thyroid disease    no taking meds-caused dizziness    Patient's surgical history, family medical history, social history, medications and allergies were all reviewed in Epic    Current Outpatient Medications  Medication Sig Dispense Refill  . albuterol  (VENTOLIN HFA) 108 (90 Base) MCG/ACT inhaler INHALE 2 PUFFS INTO THE LUNGS EVERY 4 (FOUR) HOURS AS NEEDED FOR WHEEZING OR SHORTNESS OF BREATH. 18 each 12  . allopurinol (ZYLOPRIM) 100 MG tablet Take 100 mg by mouth daily.    [name] apixaban (ELIQUIS) 5 MG TABS tablet Take 1 tablet (5 mg total) by mouth 2 (two) times daily. 60 tablet 6  . atorvastatin (LIPITOR) 20 MG tablet TAKE 1 TABLET BY MOUTH EVERY DAY 90 tablet 3  . candesartan (ATACAND) 4 MG tablet TAKE 1/2 TABLETS (2 MG TOTAL) BY MOUTH DAILY. 45 tablet 3  . cyclobenzaprine (FLEXERIL) 10 MG tablet Take 10 mg by mouth at bedtime as needed for muscle spasms.    [name] diltiazem (CARDIZEM CD) 180 MG 24 hr capsule TAKE 1 CAPSULE BY MOUTH EVERY DAY 90 capsule 3  . diltiazem (CARDIZEM) 30 MG tablet Take 30 mg by mouth as needed (heart rate over 120).    . DULoxetine (CYMBALTA) 30 MG capsule Take 1 capsule (30 mg total) by mouth daily. 90 capsule 3  . furosemide (LASIX) 20 MG tablet Take 1 tablet (20 mg total) by mouth daily as needed. For shortness of breath and lower limb edema 90 tablet 3  . ipratropium-albuterol (DUONEB) 0.5-2.5 (3) MG/3ML SOLN 1 NEBULE EVERY 6 HOURS AS NEEDED. **J45.3** 270 mL 4  . isosorbide mononitrate (IMDUR) 30 MG 24 hr tablet TAKE 1 TABLET (30 MG TOTAL) BY MOUTH DAILY. 30 tablet 11  . Nebulizers (COMPRESSOR/NEBULIZER) MISC Use as directed 1 each 0  . omeprazole (PRILOSEC) 40 MG capsule Take 1 capsule (40 mg total) by mouth daily. (Patient taking differently: Take 40 mg by mouth 2 (two) times daily.) 90 capsule 1  . predniSONE (DELTASONE) 10 MG tablet TAKE 2 TABLETS (20 MG TOTAL) BY MOUTH DAILY. CONTINUOUS 30 tablet 5  . STRIVERDI RESPIMAT 2.5 MCG/ACT AERS INHALE 2 PUFFS BY MOUTH INTO THE LUNGS DAILY 4 g 4  . tamsulosin (FLOMAX) 0.4 MG CAPS capsule Take 0.4 mg by mouth at bedtime.   11   No current facility-administered medications for this visit.    Physical Exam:     BP 110/80 (BP Location: Right Arm, Patient Position:  Sitting, Cuff Size: Normal)   Pulse (!) 103   Ht 6\' 2"  (1.88 m)   Wt 180 lb (81.6 kg)   SpO2 98%   BMI 23.11 kg/m   GENERAL:  Pleasant male in NAD PSYCH: : Cooperative, normal affect CARDIAC: Irregularly irregular, no peripheral edema PULM: Normal respiratory effort, lungs CTA bilaterally, no wheezing ABDOMEN:  Nondistended, soft, nontender. No obvious masses, no hepatomegaly,  normal bowel sounds SKIN:  turgor, no lesions seen Musculoskeletal:  Normal muscle tone, normal strength NEURO: Alert and oriented x 3, no focal neurologic deficits   IMPRESSION and PLAN:    1) Odynophagia/Noncardiac chest pain 2) Dysphagia 3) Epigastric pain 4) GERD without esophagitis EGD in 12/2020 with a normal esophagus, to include no recurrence of esophageal candidiasis.  No real improvement with recent repeat course of fluconazole (  had clinical suspicion of candidiasis recurrence months after EGD).  Plan for the following:     -Esophageal Manometry and pH/Impedance (off PPI x7 days) -RTC after EM/pH/Mii -Discussed possible trial of neuromodulation depending on above studies.  He was also just started on Cymbalta yesterday.  Will be interested to see if any positive effect on symptoms - Continue antireflux lifestyle/dietary modifications  5) History of colon polyps - Will discuss timing of repeat colonoscopy for ongoing polyp surveillance at follow-up appointment.  Last colonoscopy was 2021 with tubular adenoma x6 and fair prep  6) COPD 7) Chronic steroids 8) Atrial fibrillation 9) Chronic anticoagulation  I spent 25 minutes of time, including independent review of results as outlined above, communicating results with the patient directly, face-to-face time with the patient, coordinating care, ordering studies and medications as appropriate, and documentation.         Lavena Bullion ,DO, FACG 04/11/2021, 8:36 AM

## 2021-04-11 NOTE — Patient Instructions (Signed)
If you are age 74 or older, your body mass index should be between 23-30. Your Body mass index is 23.11 kg/m. If this is out of the aforementioned range listed, please consider follow up with your Primary Care Provider.  If you are age 71 or younger, your body mass index should be between 19-25. Your Body mass index is 23.11 kg/m. If this is out of the aformentioned range listed, please consider follow up with your Primary Care Provider.   __________________________________________________________  The [facility] providers would like to encourage you to use [facility] to communicate with providers for non-urgent requests or questions.  Due to long hold times on the telephone, sending your provider a message by [facility] may be a faster and more efficient way to get a response.  Please allow 48 business hours for a response.  Please remember that this is for non-urgent requests.   You have been scheduled for an esophageal manometry at [facility] on 05/30/2021 at 10:30am. Please arrive 30 minutes prior to your procedure for registration. You will need to go to outpatient registration (1st floor of the hospital) first. Make certain to bring your insurance cards as well as a complete list of medications.  Please remember the following:  1) Do not take any muscle relaxants, xanax (alprazolam) or ativan for 1 day prior to your test as well as the day of the test.  2) Nothing to eat or drink for 4 hours before your test.  3) Hold all diabetic medications/insulin the morning of the test. You may eat and take your medications after the test.  4.) stop taking you PPI for 7 days prior to procedure  It will take at least 2 weeks to receive the results of this test from your physician. ------------------------------------------ ABOUT ESOPHAGEAL MANOMETRY Esophageal manometry (muh-NOM-uh-tree) is a test that gauges how well your esophagus works. Your esophagus is the long, muscular tube that connects  your throat to your stomach. Esophageal manometry measures the rhythmic muscle contractions (peristalsis) that occur in your esophagus when you swallow. Esophageal manometry also measures the coordination and force exerted by the muscles of your esophagus.  During esophageal manometry, a thin, flexible tube (catheter) that contains sensors is passed through your nose, down your esophagus and into your stomach. Esophageal manometry can be helpful in diagnosing some mostly uncommon disorders that affect your esophagus.  Why it's done Esophageal manometry is used to evaluate the movement (motility) of food through the esophagus and into the stomach. The test measures how well the circular bands of muscle (sphincters) at the top and bottom of your esophagus open and close, as well as the pressure, strength and pattern of the wave of esophageal muscle contractions that moves food along.  What you can expect Esophageal manometry is an outpatient procedure done without sedation. Most people tolerate it well. You may be asked to change into a hospital gown before the test starts.  During esophageal manometry  While you are sitting up, a member of your health care team sprays your throat with a numbing medication or puts numbing gel in your nose or both.  A catheter is guided through your nose into your esophagus. The catheter may be sheathed in a water-filled sleeve. It doesn't interfere with your breathing. However, your eyes may water, and you may gag. You may have a slight nosebleed from irritation.  After the catheter is in place, you may be asked to lie on your back on an exam table, or  you may be asked to remain seated.  You then swallow small sips of water. As you do, a computer connected to the catheter records the pressure, strength and pattern of your esophageal muscle contractions.  During the test, you'll be asked to breathe slowly and smoothly, remain as still as possible, and swallow only when  you're asked to do so.  A member of your health care team may move the catheter down into your stomach while the catheter continues its measurements.  The catheter then is slowly withdrawn. The test usually lasts 20 to 30 minutes.  After esophageal manometry  When your esophageal manometry is complete, you may return to your normal activities  This test typically takes 30-45 minutes to complete. ________________________________________________________________________________

## 2021-04-19 ENCOUNTER — Other Ambulatory Visit: Payer: Self-pay | Admitting: Internal Medicine

## 2021-04-27 ENCOUNTER — Telehealth: Payer: Self-pay | Admitting: Internal Medicine

## 2021-04-27 MED ORDER — AMOXICILLIN-POT CLAVULANATE 875-125 MG PO TABS: 1.0000 | ORAL_TABLET | Freq: Two times a day (BID) | ORAL | 0 refills | Status: AC

## 2021-04-27 NOTE — Telephone Encounter (Signed)
Patient is returning phone call. Patient phone number is [phone].

## 2021-04-27 NOTE — Telephone Encounter (Signed)
Spoke with the pt  He is c/o increased SOB, cough with brown/green sputum, minimal wheezing x 2 days  He had temp of 100 last night- and took covid test at home which was neg  He has had all of his covid vaccines as well has his flu shot  He is taking his striverdi daily, and using duoneb that has helped slightly  Pt is asking for abx and states does well with augmentin  I confirmed he is still on maint pred 20 mg daily  Please advise, thanks!  Allergies  Allergen Reactions   Morphine And Related Palpitations and Other (See Comments)    Made patient light-headed and sweaty; "my heart races" (04/28/2012), pt states too much morphine gave him this reaction   Levaquin [Levofloxacin] Itching   Current Outpatient Medications on File Prior to Visit  Medication Sig Dispense Refill   albuterol (VENTOLIN HFA) 108 (90 Base) MCG/ACT inhaler INHALE 2 PUFFS INTO THE LUNGS EVERY 4 (FOUR) HOURS AS NEEDED FOR WHEEZING OR SHORTNESS OF BREATH. 18 each 12   allopurinol (ZYLOPRIM) 100 MG tablet Take 100 mg by mouth daily.     apixaban (ELIQUIS) 5 MG TABS tablet Take 1 tablet (5 mg total) by mouth 2 (two) times daily. 60 tablet 6   atorvastatin (LIPITOR) 20 MG tablet TAKE 1 TABLET BY MOUTH EVERY DAY 90 tablet 3   candesartan (ATACAND) 4 MG tablet TAKE 1/2 TABLETS (2 MG TOTAL) BY MOUTH DAILY. 45 tablet 3   cyclobenzaprine (FLEXERIL) 10 MG tablet Take 10 mg by mouth at bedtime as needed for muscle spasms.     diltiazem (CARDIZEM CD) 180 MG 24 hr capsule TAKE 1 CAPSULE BY MOUTH EVERY DAY 90 capsule 3   diltiazem (CARDIZEM) 30 MG tablet Take 30 mg by mouth as needed (heart rate over 120).     DULoxetine (CYMBALTA) 30 MG capsule Take 1 capsule (30 mg total) by mouth daily. 90 capsule 3   furosemide (LASIX) 20 MG tablet Take 1 tablet (20 mg total) by mouth daily as needed. For shortness of breath and lower limb edema 90 tablet 3   ipratropium-albuterol (DUONEB) 0.5-2.5 (3) MG/3ML SOLN 1 NEBULE EVERY 6 HOURS AS  NEEDED. **J45.3** 270 mL 4   isosorbide mononitrate (IMDUR) 30 MG 24 hr tablet TAKE 1 TABLET (30 MG TOTAL) BY MOUTH DAILY. 30 tablet 11   Nebulizers (COMPRESSOR/NEBULIZER) MISC Use as directed 1 each 0   omeprazole (PRILOSEC) 40 MG capsule Take 1 capsule (40 mg total) by mouth daily. (Patient taking differently: Take 40 mg by mouth 2 (two) times daily.) 90 capsule 1   predniSONE (DELTASONE) 10 MG tablet TAKE 2 TABLETS (20 MG TOTAL) BY MOUTH DAILY. CONTINUOUS 30 tablet 5   STRIVERDI RESPIMAT 2.5 MCG/ACT AERS INHALE 2 PUFFS BY MOUTH INTO THE LUNGS DAILY 4 g 4   tamsulosin (FLOMAX) 0.4 MG CAPS capsule Take 0.4 mg by mouth at bedtime.   11   No current facility-administered medications on file prior to visit.

## 2021-04-27 NOTE — Telephone Encounter (Signed)
Augmentin 875 mg, # 14, 1 twice daily  Stay well hydrated and please let us know if this doesn't take care of him.

## 2021-04-27 NOTE — Telephone Encounter (Signed)
LMTCB

## 2021-04-27 NOTE — Telephone Encounter (Signed)
LmtcB

## 2021-04-27 NOTE — Telephone Encounter (Signed)
Call made to patient, confirmed DOB.Made aware of CY recommendations. Voiced understanding. Confirmed pharmacy.   Nothing further needed at this time.

## 2021-05-01 DIAGNOSIS — I2510 Atherosclerotic heart disease of native coronary artery without angina pectoris: Secondary | ICD-10-CM | POA: Diagnosis not present

## 2021-05-01 DIAGNOSIS — D692 Other nonthrombocytopenic purpura: Secondary | ICD-10-CM | POA: Diagnosis not present

## 2021-05-01 DIAGNOSIS — Z23 Encounter for immunization: Secondary | ICD-10-CM | POA: Diagnosis not present

## 2021-05-01 DIAGNOSIS — G8929 Other chronic pain: Secondary | ICD-10-CM | POA: Diagnosis not present

## 2021-05-01 DIAGNOSIS — I700 Atherosclerosis of aorta: Secondary | ICD-10-CM | POA: Diagnosis not present

## 2021-05-01 DIAGNOSIS — R739 Hyperglycemia, unspecified: Secondary | ICD-10-CM | POA: Diagnosis not present

## 2021-05-01 DIAGNOSIS — R7989 Other specified abnormal findings of blood chemistry: Secondary | ICD-10-CM | POA: Diagnosis not present

## 2021-05-01 DIAGNOSIS — I5189 Other ill-defined heart diseases: Secondary | ICD-10-CM | POA: Diagnosis not present

## 2021-05-01 DIAGNOSIS — D801 Nonfamilial hypogammaglobulinemia: Secondary | ICD-10-CM | POA: Diagnosis not present

## 2021-05-01 DIAGNOSIS — E785 Hyperlipidemia, unspecified: Secondary | ICD-10-CM | POA: Diagnosis not present

## 2021-05-01 DIAGNOSIS — R296 Repeated falls: Secondary | ICD-10-CM | POA: Diagnosis not present

## 2021-05-01 DIAGNOSIS — I110 Hypertensive heart disease with heart failure: Secondary | ICD-10-CM | POA: Diagnosis not present

## 2021-05-01 DIAGNOSIS — D84821 Immunodeficiency due to drugs: Secondary | ICD-10-CM | POA: Diagnosis not present

## 2021-05-01 DIAGNOSIS — J42 Unspecified chronic bronchitis: Secondary | ICD-10-CM | POA: Diagnosis not present

## 2021-05-01 DIAGNOSIS — J449 Chronic obstructive pulmonary disease, unspecified: Secondary | ICD-10-CM | POA: Diagnosis not present

## 2021-05-01 DIAGNOSIS — I480 Paroxysmal atrial fibrillation: Secondary | ICD-10-CM | POA: Diagnosis not present

## 2021-05-09 ENCOUNTER — Telehealth: Payer: Self-pay | Admitting: Family Medicine

## 2021-05-09 ENCOUNTER — Other Ambulatory Visit: Payer: Self-pay | Admitting: Neurological Surgery

## 2021-05-09 DIAGNOSIS — M5412 Radiculopathy, cervical region: Secondary | ICD-10-CM | POA: Diagnosis not present

## 2021-05-09 DIAGNOSIS — I10 Essential (primary) hypertension: Secondary | ICD-10-CM | POA: Diagnosis not present

## 2021-05-09 MED ORDER — DULOXETINE HCL 60 MG PO CPEP: 60.0000 mg | ORAL_CAPSULE | Freq: Every day | ORAL | 3 refills | Status: DC

## 2021-05-09 NOTE — Telephone Encounter (Signed)
FYI- Pt called wanting to inform that the DULoxetine (CYMBALTA) 30 MG capsule is working very well for him.

## 2021-05-09 NOTE — Telephone Encounter (Signed)
Called pt back. He started duloxetine 30mg  po qd 04/09/21, tolerating well. Per AL,NP last OV note: "I am starting a low dose of 30mg  daily but anticipate need to increase dose. He will call if well tolerated and we will plan to increase dose to 60mg  in 4-6 weeks" He is agreeable to increase to 60mg  at this time. He will take 2-30mg  capsules daily to use up what he has and pick up new rx. He will call back if he has any questions moving forward.

## 2021-05-10 ENCOUNTER — Telehealth: Payer: Self-pay | Admitting: *Deleted

## 2021-05-10 NOTE — Telephone Encounter (Signed)
Will route to pharm then pt will need call. Last OV 02/2021. S/p prior aborted watchman - not able to implant.

## 2021-05-10 NOTE — Telephone Encounter (Signed)
° °  Pre-operative Risk Assessment    Patient Name: Levi [name]  DOB: [birth year] MRN: [id number]      Request for Surgical Clearance    Procedure:   RIGHT C6-7 POSTERIOR CERVICAL LAMINECTOMY  Date of Surgery:  Clearance 06/11/21                                 Surgeon:  DR. [name] Surgeon's Group or Practice Name:  [facility] Phone number:  [phone] Fax number:  [phone] ATTN: JESSICA   Type of Clearance Requested:   - Medical  - Pharmacy:  Hold Apixaban (Eliquis)     Type of Anesthesia:  General    Additional requests/questions:    Jiles Prows   05/10/2021, 11:02 AM

## 2021-05-11 NOTE — Telephone Encounter (Signed)
Patient with diagnosis of A Fib on Eliquis for anticoagulation.    Procedure: RIGHT C6-7 POSTERIOR CERVICAL LAMINECTOMY Date of procedure: 06/11/21   CHA2DS2-VASc Score = 3  This indicates a 3.2% annual risk of stroke. The patient's score is based upon: CHF History: 1 HTN History: 1 Diabetes History: 0 Stroke History: 0 Vascular Disease History: 0 Age Score: 1 Gender Score: 0   CrCl 74 mL/min Platelet count 164K  Per office protocol, patient can hold Eliquis for 3 days prior to procedure.

## 2021-05-14 ENCOUNTER — Telehealth: Payer: Self-pay | Admitting: Gastroenterology

## 2021-05-14 MED ORDER — FLUCONAZOLE 100 MG PO TABS: ORAL_TABLET | ORAL | 0 refills | Status: DC

## 2021-05-14 NOTE — Telephone Encounter (Signed)
Called and spoke with patient in regards to recommendations. Pt would like RX sent [facility] on file. Pt knows to call back if no improvement after he completes Diflucan regimen. Pt verbalized understanding and had no concerns at the end of the call.

## 2021-05-14 NOTE — Telephone Encounter (Signed)
Chart reviewed.  Complex patient.  Chronic pain syndrome.  Etiology does not appear to be clear.  He has been treated for candidiasis in the past. Okay to prescribe Diflucan 200 mg p.o. on day 1 then 100 mg daily for an additional 5 days.  This would treat oropharyngeal candidiasis.  If he is having problems after completing this regimen, Dr. Bryan Lemma will be back and can provide further direction. Thanks. Dr. Henrene Pastor

## 2021-05-14 NOTE — Telephone Encounter (Signed)
Dr. Henrene Pastor as DOD please advise, thanks.  Levi [name] pt with history of Odynophagia, Dysphagia, unspecified type, Chronic anticoagulation, Paroxysmal atrial fibrillation (HCC), Gastroesophageal reflux disease without esophagitis, History of COPD, and History of colonic polyps  Called and spoke with patient. He states that he is having throat pain. Pt reports that he starts out with epigastric pain that radiates up to his chest, arms, and mouth. Pt is taking Protonix 40 mg daily. Pt saw some improvement in symptoms on Protonix 40 mg BID, advised pt to increase back to 40 mg BID. Pt is requesting an RX for Fluconazole, pt states that this helped the pain the last time and he is not seeking pain medication. Pt is very frustrated since he is in pain. He is aware that the esophageal manometry with ph/impedance will further assess his GERD. Pt states that he knows the test won't help his pain.

## 2021-05-14 NOTE — Telephone Encounter (Signed)
° °  Primary Cardiologist: Candee Furbish, MD  Chart reviewed as part of pre-operative protocol coverage. Given past medical history and time since last visit, based on ACC/AHA guidelines, Levi [name] would be at acceptable risk for the planned procedure without further cardiovascular testing.   Patient with diagnosis of A Fib on Eliquis for anticoagulation.     Procedure: RIGHT C6-7 POSTERIOR CERVICAL LAMINECTOMY Date of procedure: 06/11/21     CHA2DS2-VASc Score = 3  This indicates a 3.2% annual risk of stroke. The patient's score is based upon: CHF History: 1 HTN History: 1 Diabetes History: 0 Stroke History: 0 Vascular Disease History: 0 Age Score: 1 Gender Score: 0   CrCl 74 mL/min Platelet count 164K   Per office protocol, patient can hold Eliquis for 3 days prior to procedure.    Please resume Eliquis as soon as hemostasis achieved post-procedure, at the discretion of the surgeon.   I will route this recommendation to the requesting party via Epic fax function and remove from pre-op pool.  Please call with questions.  Lenna Sciara, NP 05/14/2021, 4:17 PM

## 2021-05-14 NOTE — Telephone Encounter (Signed)
Patient called, extremely upset.  He has a hospital procedure January 4, but he says he is in excruciating pain and needs some type of medicine for relief to help him until he can have his procedure.  Please call patient and advise.  Thank you.

## 2021-05-16 ENCOUNTER — Encounter (HOSPITAL_COMMUNITY): Payer: Self-pay | Admitting: Gastroenterology

## 2021-05-22 ENCOUNTER — Other Ambulatory Visit: Payer: Self-pay

## 2021-05-22 ENCOUNTER — Emergency Department (INDEPENDENT_AMBULATORY_CARE_PROVIDER_SITE_OTHER)
Admission: EM | Admit: 2021-05-22 | Discharge: 2021-05-22 | Disposition: A | Discharge status: Home / Self Care | Payer: Medicare Other | Source: Home / Self Care | Attending: Family Medicine | Admitting: Family Medicine

## 2021-05-22 ENCOUNTER — Emergency Department (INDEPENDENT_AMBULATORY_CARE_PROVIDER_SITE_OTHER): Payer: Medicare Other

## 2021-05-22 DIAGNOSIS — S2231XA Fracture of one rib, right side, initial encounter for closed fracture: Secondary | ICD-10-CM | POA: Diagnosis not present

## 2021-05-22 DIAGNOSIS — Z96642 Presence of left artificial hip joint: Secondary | ICD-10-CM | POA: Diagnosis not present

## 2021-05-22 DIAGNOSIS — S2241XA Multiple fractures of ribs, right side, initial encounter for closed fracture: Secondary | ICD-10-CM | POA: Diagnosis not present

## 2021-05-22 DIAGNOSIS — R0781 Pleurodynia: Secondary | ICD-10-CM | POA: Diagnosis not present

## 2021-05-22 DIAGNOSIS — Z981 Arthrodesis status: Secondary | ICD-10-CM | POA: Diagnosis not present

## 2021-05-22 MED ORDER — OXYCODONE-ACETAMINOPHEN 5-325 MG PO TABS: 1.0000 | ORAL_TABLET | Freq: Three times a day (TID) | ORAL | 0 refills | Status: DC | PRN

## 2021-05-22 NOTE — ED Provider Notes (Signed)
[facility]i [name] C[facility]    CSN: [id number] Arrival date & time: 05/22/21  1194      History   Chief Complaint Chief Complaint  Patient presents with   Rib Injury    Right rib injury. X1 day    HPI Levi [name] is a 75 y.o. male.   HPI 75 year old male presents with right rib pain secondary to fall yesterday.  PMH significant for CHF and emphysema.  Past Medical History:  Diagnosis Date   Agent orange exposure 1970's   takes Imdur,Diltiazem, and Atacand daily   ALLERGIC RHINITIS    Allergy    seasonal allergies   Asthma    uses inhaler and nebulizers   Bruises easily    d/t prednisone daily   Cataract    CHF (congestive heart failure) (HCC)    dx-on meds to treat   Chronic bronchitis ([place])    "used to get it q yr; last time ?2008" (04/28/2012)   COPD (chronic obstructive pulmonary disease) (HCC)    agent orange exposure   Degenerative disk disease    "everywhere" (04/28/2012)   Diverticulitis    Dysrhythmia    afib   Elevated uric acid in blood    takes Allopurinol daily   Emphysema of lung ([place])    Enlarged prostate    but not on any meds   GERD (gastroesophageal reflux disease)    takes Protonix daily   H/O hiatal hernia    Headache    History of pneumonia    a. 2010   Hyperlipidemia    takes Lipitor daily; pt. states he takes a preventive   Joint pain    Joint swelling    Neuromuscular disorder ([place])    bilat legs, and bilat arms   PAF (paroxysmal atrial fibrillation) ([place])    a. Dx 04/2012, CHA2DS2VASc = 1 (age);  b. 04/2012 Echo: EF 40-50%, mild MR.   Personal history of colonic adenomas 02/09/2013   Shortness of breath dyspnea    Thyroid disease    no taking meds-caused dizziness    Patient Active Problem List   Diagnosis Date Noted   Chronic anticoagulation 03/15/2021   Peripheral neuropathy 02/22/2021   LV dysfunction 02/22/2021   Arthritis of carpometacarpal [facility]) joint of right thumb 08/07/2020   Cellulitis 03/22/2020   COPD  (chronic obstructive pulmonary disease) ([place]) 03/21/2020   Chest wall pain 09/21/2019   GERD (gastroesophageal reflux disease) 12/03/2017   Pneumothorax, left 04/11/2017   Lumbar stenosis with neurogenic claudication 04/07/2017   Primary osteoarthritis of right hip 07/31/2015   Primary osteoarthritis of right knee 06/17/2014   Thrush of mouth and esophagus ([place]) 04/03/2014   Palpitation 03/28/2014   Pseudoarthrosis of lumbar spine 06/15/2013   Hyperlipidemia    History of colonic polyps 02/09/2013   Atrial fibrillation ([place]) 05/11/2012   Osteoarthritis of left knee 01/10/2012   Seasonal and perennial allergic rhinitis 07/23/2010   Asthma, moderate persistent 03/06/2009    Past Surgical History:  Procedure Laterality Date   ANTERIOR CERVICAL DECOMP/DISCECTOMY FUSION N/A 07/21/2012   Procedure: ANTERIOR CERVICAL DECOMPRESSION/DISCECTOMY FUSION 2 LEVELS;  Surgeon: [name], MD;  Location: [facility];  Service: Neurosurgery;  Laterality: N/A;  C4-5 C5-6 Anterior cervical decompression/diskectomy/fusion   ANTERIOR LAT LUMBAR FUSION N/A 04/07/2017   Procedure: Thoracic twelve-Lumbar one Anterolateral decompression/fusion;  Surgeon: [name], MD;  Location: [place];  Service: Neurosurgery;  Laterality: N/A;   BACK SURGERY     x 3   CARDIAC CATHETERIZATION  11/2008   [name] - 20% calcified non flow limiting left main, 50% EF apical hypokinesis   CERVICAL DISC ARTHROPLASTY N/A 04/07/2017   Procedure: Cervical six-seven Disc arthroplasty;  Surgeon: [name], MD;  Location: [place];  Service: Neurosurgery;  Laterality: N/A;   CHEST TUBE INSERTION Left 04/11/2017   Procedure: CHEST TUBE INSERTION;  Surgeon: [name], MD;  [facility];  Service: Thoracic;  Laterality: Left;   CHOLECYSTECTOMY     COLONOSCOPY  2016   CG-MAC-miralax(good)servere TICS/TA x 2   ESOPHAGOGASTRODUODENOSCOPY     EYE SURGERY Bilateral 2011   steroidal encapsulation" (04/28/2012)   FOOT SURGERY  Right    x 2   KNEE ARTHROSCOPY Bilateral    LATERAL / POSTERIOR COMBINED FUSION LUMBAR SPINE  2012   LEFT ATRIAL APPENDAGE OCCLUSION N/A 02/22/2021   Procedure: LEFT ATRIAL APPENDAGE OCCLUSION;  Surgeon: Vickie Epley, MD;  Location: [place] [facility];  Service: Cardiovascular;  Laterality: N/A;   POLYPECTOMY  2016   severe TICS/TA x 2   REVERSE SHOULDER ARTHROPLASTY  04/28/2012   Procedure: REVERSE SHOULDER ARTHROPLASTY;  Surgeon: [name], MD;  Location: [place];  Service: Orthopedics;  Laterality: Left;  Left shouder reverse total shoulder arthroplasty   SHOULDER SURGERY     TEE WITHOUT CARDIOVERSION N/A 02/22/2021   Procedure: TRANSESOPHAGEAL ECHOCARDIOGRAM (TEE);  Surgeon: Vickie Epley, MD;  Location: [place] [facility];  Service: Cardiovascular;  Laterality: N/A;   TENDON REPAIR Right 08/07/2020   Procedure: RIGHT HAND LIGAMENT RECONSTRUCTION AND TENDON INTERPOSITION;  Surgeon: [name], MD;  Location: [place];  Service: Orthopedics;  Laterality: Right;   [place]   TOTAL HIP ARTHROPLASTY Left 2011   "left" (04/28/2012)   TOTAL HIP ARTHROPLASTY Right 07/31/2015   Procedure: TOTAL HIP ARTHROPLASTY ANTERIOR APPROACH;  Surgeon: [name], MD;  Location: [place];  Service: Orthopedics;  Laterality: Right;   TOTAL KNEE ARTHROPLASTY  01/10/2012   Procedure: TOTAL KNEE ARTHROPLASTY;  Surgeon: [name], MD;  Location: [place];  Service: Orthopedics;;  left total knee arthroplasty   TOTAL KNEE ARTHROPLASTY Right 06/17/2014   Procedure: TOTAL KNEE ARTHROPLASTY;  Surgeon: [name], MD;  Location: [place];  Service: Orthopedics;  Laterality: Right;       Home Medications    Prior to Admission medications   Medication Sig Start Date End Date Taking? Authorizing Provider  albuterol (VENTOLIN HFA) 108 (90 Base) MCG/ACT inhaler INHALE 2 PUFFS INTO THE LUNGS EVERY 4 (FOUR) HOURS AS NEEDED FOR WHEEZING OR SHORTNESS OF  BREATH. 10/13/20  Yes Young, Clinton D, MD  allopurinol (ZYLOPRIM) 100 MG tablet Take 100 mg by mouth daily.   Yes [provider]  apixaban (ELIQUIS) 5 MG TABS tablet Take 1 tablet (5 mg total) by mouth 2 (two) times daily. 12/05/20  Yes Jerline Pain, MD  atorvastatin (LIPITOR) 20 MG tablet TAKE 1 TABLET BY MOUTH EVERY DAY 04/05/21  Yes Jerline Pain, MD  candesartan (ATACAND) 4 MG tablet TAKE 1/2 TABLETS (2 MG TOTAL) BY MOUTH DAILY. 02/26/21  Yes Jerline Pain, MD  cyclobenzaprine (FLEXERIL) 10 MG tablet Take 10 mg by mouth at bedtime as needed for muscle spasms.   Yes [provider]  diltiazem (CARDIZEM CD) 180 MG 24 hr capsule TAKE 1 CAPSULE BY MOUTH EVERY DAY 01/30/21  Yes Jerline Pain, MD  diltiazem (CARDIZEM) 30 MG tablet Take 30 mg by mouth as needed (  heart rate over 120).   Yes [provider]  DULoxetine (CYMBALTA) 60 MG capsule Take 1 capsule (60 mg total) by mouth daily. 05/09/21  Yes Lomax, Amy, NP  fluconazole (DIFLUCAN) 100 MG tablet Take 2 tablets (200 mg total) by mouth on day 1, then 1 tablet (100 mg total) daily for 5 days 05/14/21  Yes Irene Shipper, MD  furosemide (LASIX) 20 MG tablet Take 1 tablet (20 mg total) by mouth daily as needed. For shortness of breath and lower limb edema 11/17/18  Yes Skains, Thana Farr, MD  ipratropium-albuterol (DUONEB) 0.5-2.5 (3) MG/3ML SOLN 1 NEBULE EVERY 6 HOURS AS NEEDED. **J45.3** 03/22/20  Yes Young, Tarri Fuller D, MD  isosorbide mononitrate (IMDUR) 30 MG 24 hr tablet TAKE 1 TABLET (30 MG TOTAL) BY MOUTH DAILY. 02/07/21  Yes Jerline Pain, MD  Nebulizers (COMPRESSOR/NEBULIZER) MISC Use as directed 12/01/17  Yes Young, Tarri Fuller D, MD  omeprazole (PRILOSEC) 40 MG capsule Take 1 capsule (40 mg total) by mouth daily. Patient taking differently: Take 40 mg by mouth 2 (two) times daily. 02/12/21  Yes Cirigliano, Vito V, DO  oxyCODONE-acetaminophen (PERCOCET/ROXICET) 5-325 MG tablet Take 1 tablet by mouth every 8 (eight) hours as  needed for severe pain. 05/22/21  Yes Eliezer Lofts, FNP  predniSONE (DELTASONE) 10 MG tablet TAKE 2 TABLETS (20 MG TOTAL) BY MOUTH DAILY. CONTINUOUS 04/20/21  Yes Young, Tarri Fuller D, MD  STRIVERDI RESPIMAT 2.5 MCG/ACT AERS INHALE 2 PUFFS BY MOUTH INTO THE LUNGS DAILY 02/25/21  Yes Baird Lyons D, MD  tamsulosin (FLOMAX) 0.4 MG CAPS capsule Take 0.4 mg by mouth at bedtime.  10/13/15  Yes [provider]    Family History Family History  Problem Relation Age of Onset   Heart disease Father        Died 74, MI   Diabetes Mother    Prostate cancer Brother    Colon polyps Brother    Colon polyps Brother    Colon cancer Other    Esophageal cancer Neg Hx    Rectal cancer Neg Hx    Stomach cancer Neg Hx    Pancreatic cancer Neg Hx    Kidney disease Neg Hx    Liver disease Neg Hx     Social History Social History   Tobacco Use   Smoking status: Never   Smokeless tobacco: Never  Vaping Use   Vaping Use: Never used  Substance Use Topics   Alcohol use: No    Alcohol/week: 0.0 standard drinks   Drug use: No     Allergies   Morphine and related and Levaquin [levofloxacin]   Review of Systems Review of Systems  Musculoskeletal:        Right-sided rib pain x1 day secondary to fall/injury  All other systems reviewed and are negative.   Physical Exam Triage Vital Signs ED Triage Vitals [05/22/21 0823]  Enc Vitals Group     BP      Pulse      Resp      Temp      Temp src      SpO2      Weight 179 lb (81.2 kg)     Height _0  (1.88 m)     Head Circumference      Peak Flow      Pain Score 8     Pain Loc      Pain Edu?      Excl. in [place]?    No data found.  Updated Vital Signs BP 120/81 (BP Location: Left Arm)    Pulse 83    Temp 97.6 F (36.4 C) (Oral)    Resp 18    Ht _0  (1.88 m)    Wt 179 lb (81.2 kg)    SpO2 96%    BMI 22.98 kg/m       Physical Exam Vitals and nursing note reviewed.  Constitutional:      General: He is not in acute distress.     Appearance: Normal appearance. He is normal weight. He is ill-appearing.  HENT:     Head: Normocephalic and atraumatic.     Mouth/Throat:     Mouth: Mucous membranes are moist.     Pharynx: Oropharynx is clear.  Eyes:     Extraocular Movements: Extraocular movements intact.     Conjunctiva/sclera: Conjunctivae normal.     Pupils: Pupils are equal, round, and reactive to light.  Cardiovascular:     Pulses: Normal pulses.     Heart sounds: Normal heart sounds.  Pulmonary:     Effort: Pulmonary effort is normal.     Breath sounds: Normal breath sounds. No wheezing, rhonchi or rales.  Musculoskeletal:     Comments: Right sided rib cage-TTP over 5th-7th anterior ribs  Skin:    General: Skin is warm and dry.  Neurological:     General: No focal deficit present.     Mental Status: He is alert and oriented to person, place, and time.     UC Treatments / Results  Labs (all labs ordered are listed, but only abnormal results are displayed) Labs Reviewed - No data to display  EKG   Radiology DG Ribs Unilateral W/Chest Right  Result Date: 05/22/2021 CLINICAL DATA:  Fall 1 day prior with anterior lower right rib pain EXAM: RIGHT RIBS AND CHEST - 3+ VIEW COMPARISON:  02/22/2021 chest radiograph. FINDINGS: Partially visualized surgical hardware from ACDF and left total shoulder arthroplasty and bilateral posterior lumbar spine fusion. Stable cardiomediastinal silhouette with normal heart size. No pneumothorax. No pleural effusion. No pulmonary edema. No acute consolidative airspace disease. Minimal linear scarring versus atelectasis at the lateral left lung base. The area of symptomatic concern as indicated by the patient in the lower right chest wall was denoted with a metallic skin BB by the technologist. Nondisplaced anterior right sixth rib fracture. No additional right rib fractures detected. No suspicious focal osseous lesions. IMPRESSION: Nondisplaced anterior right sixth rib fracture.  No pneumothorax. Minimal left costophrenic angle scarring versus atelectasis. Electronically Signed   By: Ilona Sorrel M.D.   On: 05/22/2021 08:49    Procedures Procedures (including critical care time)  Medications Ordered in UC Medications - No data to display  Initial Impression / Assessment and Plan / UC Course  I have reviewed the triage vital signs and the nursing notes.  Pertinent labs & imaging results that were available during my care of the patient were reviewed by me and considered in my medical decision making (see chart for details).     MD: 1.  Closed fracture of 1 rib of right side, initial encounter-Rx'd Percocet. Advised patient/wife of nondisplaced anterior right sixth rib fracture, without pneumothorax.  Advised patient to take pain medication and sparingly/for breakthrough pain.  Advised patient to avoid mild to moderate/strenuous activity of this area for the next 4 to 6 weeks.  Advised patient may use small pillow to splint his ribs when coughing, laughing, flatulence and/or pain due to position change.  Patient  to increase daily water intake while taking this medication.  Patient discharged home, hemodynamically stable. Final Clinical Impressions(s) / UC Diagnoses   Final diagnoses:  Closed fracture of one rib of right side, initial encounter     Discharge Instructions      Advised patient/wife of nondisplaced anterior right sixth rib fracture, without pneumothorax.  Advised patient to take pain medication and sparingly/for breakthrough pain.  Advised patient to avoid mild to moderate/strenuous activity of this area for the next 4 to 6 weeks.  Patient to increase daily water intake while taking this medication.     ED Prescriptions     Medication Sig Dispense Auth. Provider   oxyCODONE-acetaminophen (PERCOCET/ROXICET) 5-325 MG tablet Take 1 tablet by mouth every 8 (eight) hours as needed for severe pain. 30 tablet Eliezer Lofts, FNP      I have reviewed  the PDMP during this encounter.   Eliezer Lofts, [place] 05/22/21 [phone]

## 2021-05-22 NOTE — ED Triage Notes (Signed)
Pt states that he fell and injured his right rib. X1 day

## 2021-05-22 NOTE — Discharge Instructions (Addendum)
Advised patient/wife of nondisplaced anterior right sixth rib fracture, without pneumothorax.  Advised patient to take pain medication and sparingly/for breakthrough pain.  Advised patient to avoid mild to moderate/strenuous activity of this area for the next 4 to 6 weeks.  Patient to increase daily water intake while taking this medication.

## 2021-05-30 ENCOUNTER — Encounter (HOSPITAL_COMMUNITY): Payer: Self-pay | Admitting: Gastroenterology

## 2021-05-30 ENCOUNTER — Ambulatory Visit (HOSPITAL_COMMUNITY)
Admission: RE | Admit: 2021-05-30 | Discharge: 2021-05-30 | Disposition: A | Discharge status: Home / Self Care | Payer: Medicare Other | Source: Ambulatory Visit | Attending: Gastroenterology | Admitting: Gastroenterology

## 2021-05-30 ENCOUNTER — Encounter (HOSPITAL_COMMUNITY)
Admission: RE | Disposition: A | Discharge status: Home / Self Care | Payer: Self-pay | Source: Ambulatory Visit | Attending: Gastroenterology

## 2021-05-30 DIAGNOSIS — R0789 Other chest pain: Secondary | ICD-10-CM

## 2021-05-30 DIAGNOSIS — R12 Heartburn: Secondary | ICD-10-CM

## 2021-05-30 DIAGNOSIS — K219 Gastro-esophageal reflux disease without esophagitis: Secondary | ICD-10-CM | POA: Diagnosis not present

## 2021-05-30 DIAGNOSIS — R079 Chest pain, unspecified: Secondary | ICD-10-CM | POA: Insufficient documentation

## 2021-05-30 HISTORY — PX: 24 HOUR PH STUDY: SHX5419

## 2021-05-30 HISTORY — PX: ESOPHAGEAL MANOMETRY: SHX5429

## 2021-05-30 SURGERY — MANOMETRY, ESOPHAGUS

## 2021-05-30 MED ORDER — LIDOCAINE VISCOUS HCL 2 % MT SOLN
OROMUCOSAL | Status: AC
  Filled 2021-05-30: qty 15

## 2021-05-30 SURGICAL SUPPLY — 2 items
FACESHIELD LNG OPTICON STERILE (SAFETY) IMPLANT
GLOVE BIO SURGEON STRL SZ8 (GLOVE) ×4 IMPLANT

## 2021-05-30 NOTE — Progress Notes (Signed)
Esophageal manometry performed per protocol without complications.  Patient tolerated well. Ph probe placed at 40 cm per protocol without complications. Patient tolerated well. Education given on ph probe and diary.  Patient verbalized understanding.  Patient to return tomorrow at 1105 am to have ph probe removed.

## 2021-06-01 ENCOUNTER — Encounter (HOSPITAL_COMMUNITY): Payer: Self-pay | Admitting: Gastroenterology

## 2021-06-06 NOTE — Pre-Procedure Instructions (Signed)
Surgical Instructions    Your procedure is scheduled on Monday, January 16th.  Report to [facility] Main Entrance "A" at 09:45 A.M., then check in with the Admitting office.  Call this number if you have problems the morning of surgery:  [phone]   If you have any questions prior to your surgery date call [phone]: Open Monday-Friday 8am-4pm    Remember:  Do not eat after midnight the night before your surgery  You may drink clear liquids until 08:45 AM the morning of your surgery.   Clear liquids allowed are: Water, Non-Citrus Juices (without pulp), Carbonated Beverages, Clear Tea, Black Coffee Only, and Gatorade    Take these medicines the morning of surgery with A SIP OF WATER  allopurinol (ZYLOPRIM)  atorvastatin (LIPITOR) diltiazem (CARDIZEM CD) DULoxetine (CYMBALTA) isosorbide mononitrate (IMDUR) omeprazole (PRILOSEC) predniSONE (DELTASONE)  STRIVERDI RESPIMAT    If needed: albuterol (VENTOLIN HFA)- If you need your inhaler, bring it with you on day of surgery ipratropium-albuterol (DUONEB)  oxyCODONE-acetaminophen (PERCOCET/ROXICET)   As of today, STOP taking any Aspirin (unless otherwise instructed by your surgeon) Aleve, Naproxen, Ibuprofen, Motrin, Advil, Goody's, BC's, all herbal medications, fish oil, and all vitamins. This includes your diclofenac Sodium (VOLTAREN) 1 % GEL.                     Do NOT Smoke (Tobacco/Vaping) or drink Alcohol 24 hours prior to your procedure.  If you use a CPAP at night, you may bring all equipment for your overnight stay.   Contacts, glasses, piercing's, hearing aid's, dentures or partials may not be worn into surgery, please bring cases for these belongings.    For patients admitted to the hospital, discharge time will be determined by your treatment team.   Patients discharged the day of surgery will not be allowed to drive home, and someone needs to stay with them for 24 hours.  NO VISITORS WILL BE ALLOWED IN PRE-OP  WHERE PATIENTS GET READY FOR SURGERY.  ONLY 1 SUPPORT PERSON MAY BE PRESENT IN THE WAITING ROOM WHILE YOU ARE IN SURGERY.  IF YOU ARE TO BE ADMITTED, ONCE YOU ARE IN YOUR ROOM YOU WILL BE ALLOWED TWO (2) VISITORS.  Minor children may have two parents present. Special consideration for safety and communication needs will be reviewed on a case by case basis.   Special instructions:   [facility]- Preparing For Surgery  Before surgery, you can play an important role. Because skin is not sterile, your skin needs to be as free of germs as possible. You can reduce the number of germs on your skin by washing with CHG (chlorahexidine gluconate) Soap before surgery.  CHG is an antiseptic cleaner which kills germs and bonds with the skin to continue killing germs even after washing.    Oral Hygiene is also important to reduce your risk of infection.  Remember - BRUSH YOUR TEETH THE MORNING OF SURGERY WITH YOUR REGULAR TOOTHPASTE  Please do not use if you have an allergy to CHG or antibacterial soaps. If your skin becomes reddened/irritated stop using the CHG.  Do not shave (including legs and underarms) for at least 48 hours prior to first CHG shower. It is OK to shave your face.  Please follow these instructions carefully.   Shower the NIGHT BEFORE SURGERY and the MORNING OF SURGERY  If you chose to wash your hair, wash your hair first as usual with your normal shampoo.  After you shampoo, rinse your hair and  body thoroughly to remove the shampoo.  Use CHG Soap as you would any other liquid soap. You can apply CHG directly to the skin and wash gently with a scrungie or a clean washcloth.   Apply the CHG Soap to your body ONLY FROM THE NECK DOWN.  Do not use on open wounds or open sores. Avoid contact with your eyes, ears, mouth and genitals (private parts). Wash Face and genitals (private parts)  with your normal soap.   Wash thoroughly, paying special attention to the area where your surgery will be  performed.  Thoroughly rinse your body with warm water from the neck down.  DO NOT shower/wash with your normal soap after using and rinsing off the CHG Soap.  Pat yourself dry with a CLEAN TOWEL.  Wear CLEAN PAJAMAS to bed the night before surgery  Place CLEAN SHEETS on your bed the night before your surgery  DO NOT SLEEP WITH PETS.   Day of Surgery: Shower with CHG soap. Do not wear jewelry Do not wear lotions, powders, colognes, or deodorant. Men may shave face and neck. Do not bring valuables to the hospital. [facility] is not responsible for any belongings or valuables. Wear Clean/Comfortable clothing the morning of surgery Remember to brush your teeth WITH YOUR REGULAR TOOTHPASTE.   Please read over the following fact sheets that you were given.   3 days prior to your procedure or After your COVID test   You are not required to quarantine however you are required to wear a well-fitting mask when you are out and around people not in your household. If your mask becomes wet or soiled, replace with a new one.   Wash your hands often with soap and water for 20 seconds or clean your hands with an alcohol-based hand sanitizer that contains at least 60% alcohol.   Do not share personal items.   Notify your provider:  o if you are in close contact with someone who has COVID  o or if you develop a fever of 100.4 or greater, sneezing, cough, sore throat, shortness of breath or body aches.

## 2021-06-07 ENCOUNTER — Encounter (HOSPITAL_COMMUNITY)
Admission: RE | Admit: 2021-06-07 | Discharge: 2021-06-07 | Disposition: A | Discharge status: Home / Self Care | Payer: Medicare Other | Source: Ambulatory Visit | Attending: Neurological Surgery | Admitting: Neurological Surgery

## 2021-06-07 ENCOUNTER — Other Ambulatory Visit: Payer: Self-pay

## 2021-06-07 ENCOUNTER — Encounter (HOSPITAL_COMMUNITY): Payer: Self-pay

## 2021-06-07 VITALS — BP 140/90 | HR 72 | Temp 97.7°F | Resp 18 | Ht 74.0 in | Wt 187.7 lb

## 2021-06-07 DIAGNOSIS — Z981 Arthrodesis status: Secondary | ICD-10-CM | POA: Diagnosis not present

## 2021-06-07 DIAGNOSIS — E785 Hyperlipidemia, unspecified: Secondary | ICD-10-CM | POA: Diagnosis not present

## 2021-06-07 DIAGNOSIS — K219 Gastro-esophageal reflux disease without esophagitis: Secondary | ICD-10-CM | POA: Diagnosis not present

## 2021-06-07 DIAGNOSIS — I509 Heart failure, unspecified: Secondary | ICD-10-CM | POA: Diagnosis not present

## 2021-06-07 DIAGNOSIS — Z7901 Long term (current) use of anticoagulants: Secondary | ICD-10-CM | POA: Diagnosis not present

## 2021-06-07 DIAGNOSIS — J449 Chronic obstructive pulmonary disease, unspecified: Secondary | ICD-10-CM | POA: Diagnosis not present

## 2021-06-07 DIAGNOSIS — Z01818 Encounter for other preprocedural examination: Secondary | ICD-10-CM

## 2021-06-07 DIAGNOSIS — Z20822 Contact with and (suspected) exposure to covid-19: Secondary | ICD-10-CM | POA: Insufficient documentation

## 2021-06-07 DIAGNOSIS — M5412 Radiculopathy, cervical region: Secondary | ICD-10-CM | POA: Diagnosis not present

## 2021-06-07 DIAGNOSIS — M1990 Unspecified osteoarthritis, unspecified site: Secondary | ICD-10-CM | POA: Diagnosis not present

## 2021-06-07 DIAGNOSIS — I2510 Atherosclerotic heart disease of native coronary artery without angina pectoris: Secondary | ICD-10-CM | POA: Insufficient documentation

## 2021-06-07 DIAGNOSIS — I480 Paroxysmal atrial fibrillation: Secondary | ICD-10-CM | POA: Diagnosis not present

## 2021-06-07 DIAGNOSIS — Z7952 Long term (current) use of systemic steroids: Secondary | ICD-10-CM | POA: Diagnosis not present

## 2021-06-07 DIAGNOSIS — E039 Hypothyroidism, unspecified: Secondary | ICD-10-CM | POA: Diagnosis not present

## 2021-06-07 DIAGNOSIS — J4530 Mild persistent asthma, uncomplicated: Secondary | ICD-10-CM | POA: Diagnosis not present

## 2021-06-07 DIAGNOSIS — Z01812 Encounter for preprocedural laboratory examination: Secondary | ICD-10-CM | POA: Diagnosis not present

## 2021-06-07 DIAGNOSIS — N400 Enlarged prostate without lower urinary tract symptoms: Secondary | ICD-10-CM | POA: Insufficient documentation

## 2021-06-07 HISTORY — DX: Hypothyroidism, unspecified: E03.9

## 2021-06-07 LAB — CBC
HCT: 40.3 % (ref 39.0–52.0)
Hemoglobin: 13 g/dL (ref 13.0–17.0)
MCH: 31.6 pg (ref 26.0–34.0)
MCHC: 32.3 g/dL (ref 30.0–36.0)
MCV: 98.1 fL (ref 80.0–100.0)
Platelets: 197 10*3/uL (ref 150–400)
RBC: 4.11 MIL/uL — ABNORMAL LOW (ref 4.22–5.81)
RDW: 15.2 % (ref 11.5–15.5)
WBC: 10.3 10*3/uL (ref 4.0–10.5)
nRBC: 0 % (ref 0.0–0.2)

## 2021-06-07 LAB — SURGICAL PCR SCREEN
MRSA, PCR: NEGATIVE
Staphylococcus aureus: POSITIVE — AB

## 2021-06-07 LAB — SARS CORONAVIRUS 2 (TAT 6-24 HRS): SARS Coronavirus 2: NEGATIVE

## 2021-06-07 LAB — BASIC METABOLIC PANEL
Anion gap: 13 (ref 5–15)
BUN: 22 mg/dL (ref 8–23)
CO2: 28 mmol/L (ref 22–32)
Calcium: 9.8 mg/dL (ref 8.9–10.3)
Chloride: 100 mmol/L (ref 98–111)
Creatinine, Ser: 0.93 mg/dL (ref 0.61–1.24)
GFR, Estimated: >60 mL/min (ref >60)
Glucose, Bld: 96 mg/dL (ref 70–99)
Potassium: 4 mmol/L (ref 3.5–5.1)
Sodium: 141 mmol/L (ref 135–145)

## 2021-06-07 NOTE — Progress Notes (Addendum)
PCP - Dr. Shon Baton Cardiologist - Dr. Candee Furbish Neuro: Dr. [name] GI: Dr. Gerrit Heck Pulmonologist: Dr. Baird Lyons  PPM/ICD - Denies  Chest x-ray - N/A EKG - 01/23/21 Stress Test - 11/19/2008 ECHO - 02/22/21 Cardiac Cath - 11/2008  Sleep Study - Denies  Diabetic: Denies  Blood Thinner Instructions: LD of Eliquis: 06/05/20 Aspirin Instructions: N/A  ERAS Protcol - Yes PRE-SURGERY Ensure or G2- No  COVID TEST- 06/07/21 in PAT   Anesthesia review: Yes, cardiac hx; Clearance 06/11/21  Patient denies shortness of breath, fever, cough and chest pain at PAT appointment   All instructions explained to the patient, with a verbal understanding of the material. Patient agrees to go over the instructions while at home for a better understanding. Patient also instructed to self quarantine after being tested for COVID-19. The opportunity to ask questions was provided.

## 2021-06-08 NOTE — Progress Notes (Signed)
Anesthesia Chart Review:  Case: [id number] Date/Time: 06/11/21 1134   Procedure: Right C6-7 Laminectomy and foraminotomy (Right: Back)   Anesthesia type: General   Pre-op diagnosis: Right cervical radiculopathy   Location: [facility] OR ROOM 20 / [place] OR   Surgeons: [name], MD       DISCUSSION: Patient is a 76 year old male scheduled for the above procedure.  History includes never smoker, HLD, PAF (diagnosed 04/2012; unable to place Watchman device due to highly redundant/flexible interatrial septum 02/22/21), CHF, COPD/emphysema, asthma (steroid dependent), agent orange exposure, dyspnea, GERD, hiatal hernia, BPH, diverticulitis, hypothyroidism, arthritis/DJD (left THA 02/21/10; left TKA 01/10/12; left reverse TSA 04/28/12; right TKA 06/17/14; right THA 07/31/15; right hand arthroplasty 08/07/20), spinal surgeries (L1-5 pedicle screw fixation, L4-5 posterolateral arthrodesis 07/30/10; C4-6 ACDF 07/21/12; revision L4-5 arthrodesis, PLIF L5-S1 06/15/13; T12-L1 total discectomy/anterolateral arthrodesis & C6-7 ACDF 93/81/82, complicated by left pleural opening with repair in OR but required left CT 04/11/17 for left pneumothorax).  Preoperative cardiology input outlined by Diona Browner, NP, "Given past medical history and time since last visit, based on ACC/AHA guidelines, Levi [name] would be at acceptable risk for the planned procedure without further cardiovascular test...CHA2DS2-VASc Score = 3.Marland KitchenMarland KitchenPer office protocol, patient can hold Eliquis for 3 days prior to procedure.     Please resume Eliquis as soon as hemostasis achieved post-procedure, at the discretion of the surgeon."  06/07/2021 presurgical COVID-19 test was negative.  Anesthesia team to evaluate on the day of surgery. He is on prednisone 20 mg daily for moderate persistent asthma that is followed by pulmonologist Dr. Annamaria Boots.     VS: BP 140/90    Pulse 72    Temp 36.5 C (Oral)    Resp 18    Ht _0  (1.88 m)    Wt 85.1 kg    SpO2 100%    BMI 24.10  kg/m    PROVIDERS: Shon Baton, MD is PCP  - Candee Furbish, MD is cardiologist. Last visit 03/15/21. Six month follow-up planned.  Lars Mage, MD is EP cardiologist. He was evaluated on 01/23/21 for consideration of Watchman device due to increased risks related to anticoagulation therapy with his history of recurrent falls. Watchman device was attempted on 02/22/21, but "interatrial septum was highly redundant and transseptal puncture was too close to the outside wall of the left atrium to be safely performed.  Therefore watchman attempt was aborted." - Baird Lyons, MD is pulmonologist. Last visit 09/20/20.  [name], MD is neurologist - Gerrit Heck, DO is GI - Saw hematologist Burney Gauze, MD on 02/28/20 for hypogammaglobulinemia with non-healing right leg ulcer. "There is no hematologic malignancy.  He has mildly suppressed IgG level of 357 mg/dL.  His IgA level was 53 mg/dL.  He had no monoclonal spike in his blood.  There is no monoclonal light chains." He did not think the RLE ulcer was related to low imunoglobulins, and more likely from steroids. He was sent to the ED for further evaluation of RLE ulcer and swelling.    LABS: Labs reviewed: Acceptable for surgery. (all labs ordered are listed, but only abnormal results are displayed)  Labs Reviewed  SURGICAL PCR SCREEN - Abnormal; Notable for the following components:      Result Value   Staphylococcus aureus POSITIVE (*)    All other components within normal limits  CBC - Abnormal; Notable for the following components:   RBC 4.11 (*)    All other components within normal limits  SARS  CORONAVIRUS 2 (TAT 6-24 HRS)  BASIC METABOLIC PANEL    "Office Spirometry 10/25/16-mild restriction of exhaled volume, minimal obstruction. FVC 4.04/78%, FEV1 2.71/71%, ratio 0.67, FEF 25-75% 1.68/58%"  IMAGES: Xray right ribs and chest 3V 05/22/21: IMPRESSION: Nondisplaced anterior right sixth rib fracture. No  pneumothorax. Minimal left costophrenic angle scarring versus atelectasis.   CT C-spine 03/01/21: IMPRESSION: 1. Chronic C4-C5 and C5-C6 ACDF with solid arthrodesis. 2. Chronic adjacent segment disease at C3-C4 with mild new spinal stenosis suspected, perhaps due to interval posterior ligamentous hypertrophy. 3. Chronic C6-C7 disc arthroplasty. Suspicion of mild to moderate right C7 neural foraminal stenosis related to residual endplate spurring which may have also increased since 2020. 4. Severe disc degeneration at T2-T3 with facet hypertrophy and up to severe right > left T2 foraminal stenosis.   EKG: 01/23/21: SR   CV: TEE (during attempted Watchman device) 02/22/21: IMPRESSIONS   1. Patient had a PFO with with aneurysmal mobile atrial septum TEE  guidance during transeptal showed puncture would be dangerous as location  away from PFO would result in puncture too close to the posterior LA wall.  Dr Quentin Ore decided to abort case due  to risk of trans septal puncture.   2. Left ventricular ejection fraction, by estimation, is 45 to 50%. The  left ventricle has mildly decreased function. The left ventricle  demonstrates global hypokinesis. The left ventricular internal cavity size  was mildly dilated.   3. Right ventricular systolic function is normal. The right ventricular  size is normal.   4. No left atrial/left atrial appendage thrombus was detected.   5. The mitral valve is abnormal. Mild mitral valve regurgitation.   6. The aortic valve is tricuspid. There is mild calcification of the  aortic valve. Aortic valve regurgitation is mild. Mild to moderate aortic  valve sclerosis/calcification is present, without any evidence of aortic  stenosis.   7. Small left to right shunt post trans septal puncture.    TTE 02/14/21: IMPRESSIONS   1. Left ventricular ejection fraction, by estimation, is 45 to 50%. The  left ventricle has mildly decreased function. The left ventricle   demonstrates global hypokinesis. There is mild concentric left ventricular  hypertrophy. Left ventricular diastolic  parameters are indeterminate.   2. Right ventricular systolic function is normal. The right ventricular  size is normal.   3. Left atrial size was mildly dilated.   4. The mitral valve is normal in structure. Trivial mitral valve  regurgitation. No evidence of mitral stenosis.   5. The aortic valve is tricuspid. Aortic valve regurgitation is trivial.  Mild aortic valve sclerosis is present, with no evidence of aortic valve  stenosis.   6. The inferior vena cava is normal in size with greater than 50%  respiratory variability, suggesting right atrial pressure of 3 mmHg.  - Comparison(s): Prior images reviewed side by side.  - Conclusion(s)/Recommendation(s): Frequent PVCs noted throughout study,  interfering with full evaluation of function and wall motion.    CT Cardiac (for Watchman planning) 01/31/21: IMPRESSION: 1. The left atrial appendage is a large chicken wing morphology without thrombus. 2. A 38m Watchman FLX device is recommended based on the above landing zone measurements (25.4 mm maximum diameter; 20% compression). 3. There is no thrombus in the left atrial appendage. 4. An inferior posterior IAS puncture site is recommended. 5. Optimal deployment angle: RAO 2 CAU 15 6. Normal coronary origin. Right dominance. 7. Coronary artery calcium score is 197, which is 49th percentile for age and  sex matched peers.   Cardiac cath 11/25/08 (done due to abnormal stress test on 11/22/08 that showed mild inferior ischemia, transient ischemic dilatation possibly indications 3V CAD,  EF 43%): IMPRESSION: 1. Calcified ostial left anterior descending lesion of up to 20% stenosis, which is not flow limiting, otherwise no angiographically significant coronary artery disease.  2. Left ventricular ejection fraction of approximately 50% with mildly dilated left ventricle and  apical hypokinesis noted.  3. No abdominal aortic aneurysm. No renal artery stenosis.    Past Medical History:  Diagnosis Date   Agent orange exposure 1970's   takes Imdur,Diltiazem, and Atacand daily   ALLERGIC RHINITIS    Allergy    seasonal allergies   Asthma    uses inhaler and nebulizers   Bruises easily    d/t prednisone daily   Cataract    CHF (congestive heart failure) (HCC)    dx-on meds to treat   Chronic bronchitis ([place])    "used to get it q yr; last time ?2008" (04/28/2012)   COPD (chronic obstructive pulmonary disease) (HCC)    agent orange exposure   Degenerative disk disease    "everywhere" (04/28/2012)   Diverticulitis    Dysrhythmia    afib   Elevated uric acid in blood    takes Allopurinol daily   Emphysema of lung ([place])    Enlarged prostate    but not on any meds   GERD (gastroesophageal reflux disease)    takes Protonix daily   H/O hiatal hernia    Headache    History of pneumonia    a. 2010   Hyperlipidemia    takes Lipitor daily; pt. states he takes a preventive   Hypothyroidism    Joint pain    Joint swelling    Neuromuscular disorder ([place])    bilat legs, and bilat arms   PAF (paroxysmal atrial fibrillation) ([place])    a. Dx 04/2012, CHA2DS2VASc = 1 (age);  b. 04/2012 Echo: EF 40-50%, mild MR.   Personal history of colonic adenomas 02/09/2013   Pneumonia    Shortness of breath dyspnea    Thyroid disease    no taking meds-caused dizziness    Past Surgical History:  Procedure Laterality Date   32 HOUR [place] STUDY N/A 05/30/2021   Procedure: 24 HOUR [place] STUDY;  Surgeon: Lavena Bullion, DO;  Location: [facility];  Service: Gastroenterology;  Laterality: N/A;   ANTERIOR CERVICAL DECOMP/DISCECTOMY FUSION N/A 07/21/2012   Procedure: ANTERIOR CERVICAL DECOMPRESSION/DISCECTOMY FUSION 2 LEVELS;  Surgeon: [name], MD;  Location: [place] [facility];  Service: Neurosurgery;  Laterality: N/A;  C4-5 C5-6 Anterior cervical decompression/diskectomy/fusion    ANTERIOR LAT LUMBAR FUSION N/A 04/07/2017   Procedure: Thoracic twelve-Lumbar one Anterolateral decompression/fusion;  Surgeon: [name], MD;  Location: [place];  Service: Neurosurgery;  Laterality: N/A;   BACK SURGERY     x 3   CARDIAC CATHETERIZATION  11/2008   [name] - 20% calcified non flow limiting left main, 50% EF apical hypokinesis   CERVICAL DISC ARTHROPLASTY N/A 04/07/2017   Procedure: Cervical six-seven Disc arthroplasty;  Surgeon: [name], MD;  Location: [place];  Service: Neurosurgery;  Laterality: N/A;   CHEST TUBE INSERTION Left 04/11/2017   Procedure: CHEST TUBE INSERTION;  Surgeon: [name], MD;  Location: [place];  Service: Thoracic;  Laterality: Left;   CHOLECYSTECTOMY     COLONOSCOPY  2016   CG-MAC-miralax(good)servere TICS/TA x 2   ESOPHAGEAL MANOMETRY N/A 05/30/2021   Procedure: ESOPHAGEAL  MANOMETRY (EM);  Surgeon: Lavena Bullion, DO;  Location: [facility];  Service: Gastroenterology;  Laterality: N/A;  ph impedence   ESOPHAGOGASTRODUODENOSCOPY     EYE SURGERY Bilateral 2011   steroidal encapsulation" (04/28/2012)   FOOT SURGERY Right    x 2   KNEE ARTHROSCOPY Bilateral    LATERAL / POSTERIOR COMBINED FUSION LUMBAR SPINE  2012   LEFT ATRIAL APPENDAGE OCCLUSION N/A 02/22/2021   Procedure: LEFT ATRIAL APPENDAGE OCCLUSION;  Surgeon: Vickie Epley, MD;  Location: [place] [facility];  Service: Cardiovascular;  Laterality: N/A;   POLYPECTOMY  2016   severe TICS/TA x 2   REVERSE SHOULDER ARTHROPLASTY  04/28/2012   Procedure: REVERSE SHOULDER ARTHROPLASTY;  Surgeon: [name], MD;  Location: [place];  Service: Orthopedics;  Laterality: Left;  Left shouder reverse total shoulder arthroplasty   SHOULDER SURGERY     TEE WITHOUT CARDIOVERSION N/A 02/22/2021   Procedure: TRANSESOPHAGEAL ECHOCARDIOGRAM (TEE);  Surgeon: Vickie Epley, MD;  Location: [place] [facility];  Service: Cardiovascular;  Laterality: N/A;   TENDON REPAIR Right  08/07/2020   Procedure: RIGHT HAND LIGAMENT RECONSTRUCTION AND TENDON INTERPOSITION;  Surgeon: [name], MD;  Location: [place];  Service: Orthopedics;  Laterality: Right;   [place]   TOTAL HIP ARTHROPLASTY Left 2011   "left" (04/28/2012)   TOTAL HIP ARTHROPLASTY Right 07/31/2015   Procedure: TOTAL HIP ARTHROPLASTY ANTERIOR APPROACH;  Surgeon: [name], MD;  Location: [place];  Service: Orthopedics;  Laterality: Right;   TOTAL KNEE ARTHROPLASTY  01/10/2012   Procedure: TOTAL KNEE ARTHROPLASTY;  Surgeon: [name], MD;  Location: [place];  Service: Orthopedics;;  left total knee arthroplasty   TOTAL KNEE ARTHROPLASTY Right 06/17/2014   Procedure: TOTAL KNEE ARTHROPLASTY;  Surgeon: [name], MD;  Location: [place];  Service: Orthopedics;  Laterality: Right;    MEDICATIONS:  albuterol (VENTOLIN HFA) 108 (90 Base) MCG/ACT inhaler   allopurinol (ZYLOPRIM) 100 MG tablet   apixaban (ELIQUIS) 5 MG TABS tablet   atorvastatin (LIPITOR) 20 MG tablet   candesartan (ATACAND) 4 MG tablet   cyclobenzaprine (FLEXERIL) 10 MG tablet   diclofenac Sodium (VOLTAREN) 1 % GEL   diltiazem (CARDIZEM CD) 180 MG 24 hr capsule   diltiazem (CARDIZEM) 30 MG tablet   DULoxetine (CYMBALTA) 60 MG capsule   furosemide (LASIX) 20 MG tablet   ipratropium-albuterol (DUONEB) 0.5-2.5 (3) MG/3ML SOLN   isosorbide mononitrate (IMDUR) 30 MG 24 hr tablet   Nebulizers (COMPRESSOR/NEBULIZER) MISC   omeprazole (PRILOSEC) 40 MG capsule   oxyCODONE-acetaminophen (PERCOCET/ROXICET) 5-325 MG tablet   predniSONE (DELTASONE) 10 MG tablet   STRIVERDI RESPIMAT 2.5 MCG/ACT AERS   tamsulosin (FLOMAX) 0.4 MG CAPS capsule   No current facility-administered medications for this encounter.    Myra Gianotti, PA-C Surgical Short Stay/Anesthesiology [facility] Phone [phone] [facility] Phone [phone] 06/08/2021 1:34 PM

## 2021-06-08 NOTE — Anesthesia Preprocedure Evaluation (Addendum)
Anesthesia Evaluation  Patient identified by MRN, date of birth, ID band Patient awake    Reviewed: Allergy & Precautions, NPO status , Patient's Chart, lab work & pertinent test results  Airway Mallampati: II  TM Distance: >3 FB Neck ROM: Limited    Dental no notable dental hx.    Pulmonary asthma , COPD,  COPD inhaler,    Pulmonary exam normal        Cardiovascular +CHF  + dysrhythmias (on Eliquis) Atrial Fibrillation  Rhythm:Irregular Rate:Normal     Neuro/Psych  Headaches, negative psych ROS   GI/Hepatic Neg liver ROS, hiatal hernia, GERD  Medicated,  Endo/Other  Hypothyroidism   Renal/GU negative Renal ROS  negative genitourinary   Musculoskeletal  (+) Arthritis , Osteoarthritis,    Abdominal Normal abdominal exam  (+)   Peds  Hematology negative hematology ROS (+)   Anesthesia Other Findings   Reproductive/Obstetrics                             Anesthesia Physical Anesthesia Plan  ASA: 3  Anesthesia Plan: General   Post-op Pain Management: Tylenol PO (pre-op) and Ketamine IV   Induction: Intravenous  PONV Risk Score and Plan: 2 and Ondansetron, Dexamethasone and Treatment may vary due to age or medical condition  Airway Management Planned: Mask and Oral ETT  Additional Equipment: None  Intra-op Plan:   Post-operative Plan: Extubation in OR  Informed Consent: I have reviewed the patients History and Physical, chart, labs and discussed the procedure including the risks, benefits and alternatives for the proposed anesthesia with the patient or authorized representative who has indicated his/her understanding and acceptance.     Dental advisory given  Plan Discussed with: CRNA  Anesthesia Plan Comments: (PAT note written 06/08/2021 by Myra Gianotti, PA-C.  Lab Results      Component                Value               Date                      WBC                       10.3                06/07/2021                HGB                      13.0                06/07/2021                HCT                      40.3                06/07/2021                MCV                      98.1                06/07/2021                PLT  197                 06/07/2021           Lab Results      Component                Value               Date                      NA                       141                 06/07/2021                K                        4.0                 06/07/2021                CO2                      28                  06/07/2021                GLUCOSE                  96                  06/07/2021                BUN                      22                  06/07/2021                CREATININE               0.93                06/07/2021                CALCIUM                  9.8                 06/07/2021                EGFR                     92                  01/23/2021                GFRNONAA                 >60                 06/07/2021            TEE (during attempted Watchman device) 02/22/21: IMPRESSIONS  1. Patient had a PFO with with aneurysmal mobile atrial septum TEE  guidance during transeptal showed puncture would be dangerous as location  away from PFO would  result in puncture too close to the posterior LA wall.  Dr Quentin Ore decided to abort case due  to risk of trans septal puncture.  2. Left ventricular ejection fraction, by estimation, is 45 to 50%. The  left ventricle has mildly decreased function. The left ventricle  demonstrates global hypokinesis. The left ventricular internal cavity size  was mildly dilated.  3. Right ventricular systolic function is normal. The right ventricular  size is normal.  4. No left atrial/left atrial appendage thrombus was detected.  5. The mitral valve is abnormal. Mild mitral valve regurgitation.  6. The aortic valve is tricuspid. There is mild  calcification of the  aortic valve. Aortic valve regurgitation is mild. Mild to moderate aortic  valve sclerosis/calcification is present, without any evidence of aortic  stenosis.  7. Small left to right shunt post trans septal puncture. )       Anesthesia Quick Evaluation

## 2021-06-11 ENCOUNTER — Ambulatory Visit (HOSPITAL_COMMUNITY): Payer: Medicare Other

## 2021-06-11 ENCOUNTER — Encounter (HOSPITAL_COMMUNITY): Payer: Self-pay | Admitting: Neurological Surgery

## 2021-06-11 ENCOUNTER — Observation Stay (HOSPITAL_COMMUNITY)
Admission: RE | Admit: 2021-06-11 | Discharge: 2021-06-11 | Disposition: A | Discharge status: Home / Self Care | Payer: Medicare Other | Source: Ambulatory Visit | Attending: Neurological Surgery | Admitting: Neurological Surgery

## 2021-06-11 ENCOUNTER — Ambulatory Visit (HOSPITAL_COMMUNITY): Payer: Medicare Other | Admitting: Certified Registered"

## 2021-06-11 ENCOUNTER — Ambulatory Visit (HOSPITAL_COMMUNITY): Payer: Medicare Other | Admitting: Vascular Surgery

## 2021-06-11 ENCOUNTER — Ambulatory Visit (HOSPITAL_COMMUNITY)
Admission: RE | Disposition: A | Discharge status: Home / Self Care | Payer: Self-pay | Source: Ambulatory Visit | Attending: Neurological Surgery

## 2021-06-11 ENCOUNTER — Other Ambulatory Visit: Payer: Self-pay

## 2021-06-11 DIAGNOSIS — I509 Heart failure, unspecified: Secondary | ICD-10-CM | POA: Diagnosis not present

## 2021-06-11 DIAGNOSIS — M4722 Other spondylosis with radiculopathy, cervical region: Principal | ICD-10-CM | POA: Insufficient documentation

## 2021-06-11 DIAGNOSIS — Z96612 Presence of left artificial shoulder joint: Secondary | ICD-10-CM | POA: Insufficient documentation

## 2021-06-11 DIAGNOSIS — J45909 Unspecified asthma, uncomplicated: Secondary | ICD-10-CM | POA: Insufficient documentation

## 2021-06-11 DIAGNOSIS — Z96643 Presence of artificial hip joint, bilateral: Secondary | ICD-10-CM | POA: Diagnosis not present

## 2021-06-11 DIAGNOSIS — Z9689 Presence of other specified functional implants: Secondary | ICD-10-CM | POA: Diagnosis not present

## 2021-06-11 DIAGNOSIS — E039 Hypothyroidism, unspecified: Secondary | ICD-10-CM | POA: Insufficient documentation

## 2021-06-11 DIAGNOSIS — M4802 Spinal stenosis, cervical region: Secondary | ICD-10-CM | POA: Diagnosis not present

## 2021-06-11 DIAGNOSIS — Z96653 Presence of artificial knee joint, bilateral: Secondary | ICD-10-CM | POA: Diagnosis not present

## 2021-06-11 DIAGNOSIS — M5412 Radiculopathy, cervical region: Secondary | ICD-10-CM | POA: Diagnosis present

## 2021-06-11 DIAGNOSIS — J449 Chronic obstructive pulmonary disease, unspecified: Secondary | ICD-10-CM | POA: Diagnosis not present

## 2021-06-11 DIAGNOSIS — Z419 Encounter for procedure for purposes other than remedying health state, unspecified: Secondary | ICD-10-CM

## 2021-06-11 HISTORY — PX: POSTERIOR CERVICAL LAMINECTOMY WITH MET- RX: SHX6035

## 2021-06-11 SURGERY — POSTERIOR CERVICAL LAMINECTOMY WITH MET- RX
Anesthesia: General | Site: Spine Cervical | Laterality: Right

## 2021-06-11 MED ORDER — DEXAMETHASONE SODIUM PHOSPHATE 10 MG/ML IJ SOLN
INTRAMUSCULAR | Status: AC
  Filled 2021-06-11: qty 1

## 2021-06-11 MED ORDER — TAMSULOSIN HCL 0.4 MG PO CAPS: 0.4000 mg | ORAL_CAPSULE | Freq: Every day | ORAL | Status: DC

## 2021-06-11 MED ORDER — DEXAMETHASONE SODIUM PHOSPHATE 10 MG/ML IJ SOLN
INTRAMUSCULAR | Status: DC | PRN
  Administered 2021-06-11: 10 mg via INTRAVENOUS

## 2021-06-11 MED ORDER — FLEET ENEMA 7-19 GM/118ML RE ENEM: 1.0000 | ENEMA | Freq: Once | RECTAL | Status: DC | PRN

## 2021-06-11 MED ORDER — ALLOPURINOL 100 MG PO TABS: 100.0000 mg | ORAL_TABLET | Freq: Every day | ORAL | Status: DC

## 2021-06-11 MED ORDER — BUPIVACAINE HCL (PF) 0.5 % IJ SOLN
INTRAMUSCULAR | Status: DC | PRN
  Administered 2021-06-11: 5 mL
  Administered 2021-06-11: 10 mL

## 2021-06-11 MED ORDER — THROMBIN 5000 UNITS EX SOLR
CUTANEOUS | Status: AC
  Filled 2021-06-11: qty 5000

## 2021-06-11 MED ORDER — CHLORHEXIDINE GLUCONATE CLOTH 2 % EX PADS: 6.0000 | MEDICATED_PAD | Freq: Once | CUTANEOUS | Status: DC

## 2021-06-11 MED ORDER — ACETAMINOPHEN 500 MG PO TABS: 1000.0000 mg | ORAL_TABLET | Freq: Once | ORAL | Status: AC

## 2021-06-11 MED ORDER — CYCLOBENZAPRINE HCL 10 MG PO TABS: 10.0000 mg | ORAL_TABLET | Freq: Three times a day (TID) | ORAL | 1 refills | Status: DC | PRN

## 2021-06-11 MED ORDER — CHLORHEXIDINE GLUCONATE 0.12 % MT SOLN
15.0000 mL | Freq: Once | OROMUCOSAL | Status: AC
  Administered 2021-06-11: 15 mL via OROMUCOSAL
  Filled 2021-06-11: qty 15

## 2021-06-11 MED ORDER — ALUM & MAG HYDROXIDE-SIMETH 200-200-20 MG/5ML PO SUSP: 30.0000 mL | Freq: Four times a day (QID) | ORAL | Status: DC | PRN

## 2021-06-11 MED ORDER — OXYCODONE-ACETAMINOPHEN 5-325 MG PO TABS
1.0000 | ORAL_TABLET | ORAL | 0 refills | Status: DC | PRN
Start: 2021-06-11 — End: 2021-07-07

## 2021-06-11 MED ORDER — EPHEDRINE SULFATE-NACL 50-0.9 MG/10ML-% IV SOSY
PREFILLED_SYRINGE | INTRAVENOUS | Status: DC | PRN
Start: 2021-06-11 — End: 2021-06-11
  Administered 2021-06-11 (×2): 10 mg via INTRAVENOUS
  Administered 2021-06-11 (×2): 15 mg via INTRAVENOUS

## 2021-06-11 MED ORDER — METHOCARBAMOL 500 MG PO TABS: 500.0000 mg | ORAL_TABLET | Freq: Four times a day (QID) | ORAL | Status: DC | PRN

## 2021-06-11 MED ORDER — POLYETHYLENE GLYCOL 3350 17 G PO PACK: 17.0000 g | PACK | Freq: Every day | ORAL | Status: DC | PRN

## 2021-06-11 MED ORDER — ONDANSETRON HCL 4 MG/2ML IJ SOLN: 4.0000 mg | Freq: Four times a day (QID) | INTRAMUSCULAR | Status: DC | PRN

## 2021-06-11 MED ORDER — IPRATROPIUM-ALBUTEROL 0.5-2.5 (3) MG/3ML IN SOLN: 3.0000 mL | Freq: Four times a day (QID) | RESPIRATORY_TRACT | Status: DC | PRN

## 2021-06-11 MED ORDER — DOCUSATE SODIUM 100 MG PO CAPS: 100.0000 mg | ORAL_CAPSULE | Freq: Two times a day (BID) | ORAL | Status: DC

## 2021-06-11 MED ORDER — ONDANSETRON HCL 4 MG/2ML IJ SOLN
INTRAMUSCULAR | Status: DC | PRN
  Administered 2021-06-11: 4 mg via INTRAVENOUS

## 2021-06-11 MED ORDER — THROMBIN (RECOMBINANT) 5000 UNITS EX SOLR
CUTANEOUS | Status: AC
  Filled 2021-06-11: qty 5000

## 2021-06-11 MED ORDER — METHOCARBAMOL 1000 MG/10ML IJ SOLN
500.0000 mg | Freq: Four times a day (QID) | INTRAVENOUS | Status: DC | PRN
  Filled 2021-06-11: qty 5

## 2021-06-11 MED ORDER — PROPOFOL 10 MG/ML IV BOLUS
INTRAVENOUS | Status: DC | PRN
  Administered 2021-06-11: 150 mg via INTRAVENOUS

## 2021-06-11 MED ORDER — SODIUM CHLORIDE 0.9% FLUSH: 3.0000 mL | INTRAVENOUS | Status: DC | PRN

## 2021-06-11 MED ORDER — FENTANYL CITRATE (PF) 250 MCG/5ML IJ SOLN
INTRAMUSCULAR | Status: AC
  Filled 2021-06-11: qty 5

## 2021-06-11 MED ORDER — ACETAMINOPHEN 500 MG PO TABS
ORAL_TABLET | ORAL | Status: AC
  Administered 2021-06-11: 1000 mg via ORAL
  Filled 2021-06-11: qty 2

## 2021-06-11 MED ORDER — ACETAMINOPHEN 650 MG RE SUPP: 650.0000 mg | RECTAL | Status: DC | PRN

## 2021-06-11 MED ORDER — BISACODYL 10 MG RE SUPP: 10.0000 mg | Freq: Every day | RECTAL | Status: DC | PRN

## 2021-06-11 MED ORDER — ALBUTEROL SULFATE (2.5 MG/3ML) 0.083% IN NEBU: 2.5000 mg | INHALATION_SOLUTION | RESPIRATORY_TRACT | Status: DC | PRN

## 2021-06-11 MED ORDER — PHENOL 1.4 % MT LIQD: 1.0000 | OROMUCOSAL | Status: DC | PRN

## 2021-06-11 MED ORDER — DILTIAZEM HCL ER COATED BEADS 180 MG PO CP24: 180.0000 mg | ORAL_CAPSULE | Freq: Every day | ORAL | Status: DC

## 2021-06-11 MED ORDER — LIDOCAINE-EPINEPHRINE 1 %-1:100000 IJ SOLN
INTRAMUSCULAR | Status: DC | PRN
  Administered 2021-06-11: 5 mL

## 2021-06-11 MED ORDER — DULOXETINE HCL 60 MG PO CPEP
60.0000 mg | ORAL_CAPSULE | Freq: Every day | ORAL | Status: DC
Start: 2021-06-12 — End: 2021-06-12

## 2021-06-11 MED ORDER — 0.9 % SODIUM CHLORIDE (POUR BTL) OPTIME
TOPICAL | Status: DC | PRN
  Administered 2021-06-11: 1000 mL

## 2021-06-11 MED ORDER — CEFAZOLIN SODIUM-DEXTROSE 2-4 GM/100ML-% IV SOLN
2.0000 g | Freq: Three times a day (TID) | INTRAVENOUS | Status: DC
  Administered 2021-06-11: 2 g via INTRAVENOUS
  Filled 2021-06-11: qty 100

## 2021-06-11 MED ORDER — DILTIAZEM HCL 30 MG PO TABS: 30.0000 mg | ORAL_TABLET | ORAL | Status: DC | PRN

## 2021-06-11 MED ORDER — IRBESARTAN 75 MG PO TABS: 37.5000 mg | ORAL_TABLET | Freq: Every day | ORAL | Status: DC

## 2021-06-11 MED ORDER — ROCURONIUM BROMIDE 10 MG/ML (PF) SYRINGE
PREFILLED_SYRINGE | INTRAVENOUS | Status: DC | PRN
  Administered 2021-06-11: 50 mg via INTRAVENOUS
  Administered 2021-06-11: 10 mg via INTRAVENOUS

## 2021-06-11 MED ORDER — FENTANYL CITRATE (PF) 100 MCG/2ML IJ SOLN
25.0000 ug | INTRAMUSCULAR | Status: DC | PRN
  Administered 2021-06-11: 50 ug via INTRAVENOUS

## 2021-06-11 MED ORDER — SENNA 8.6 MG PO TABS: 1.0000 | ORAL_TABLET | Freq: Two times a day (BID) | ORAL | Status: DC

## 2021-06-11 MED ORDER — SODIUM CHLORIDE 0.9% FLUSH: 3.0000 mL | Freq: Two times a day (BID) | INTRAVENOUS | Status: DC

## 2021-06-11 MED ORDER — BUPIVACAINE HCL (PF) 0.5 % IJ SOLN
INTRAMUSCULAR | Status: AC
  Filled 2021-06-11: qty 30

## 2021-06-11 MED ORDER — ARFORMOTEROL TARTRATE 15 MCG/2ML IN NEBU
15.0000 ug | INHALATION_SOLUTION | Freq: Two times a day (BID) | RESPIRATORY_TRACT | Status: DC
  Filled 2021-06-11 (×2): qty 2

## 2021-06-11 MED ORDER — PANTOPRAZOLE SODIUM 40 MG PO TBEC: 80.0000 mg | DELAYED_RELEASE_TABLET | Freq: Every day | ORAL | Status: DC

## 2021-06-11 MED ORDER — BACITRACIN ZINC 500 UNIT/GM EX OINT
TOPICAL_OINTMENT | CUTANEOUS | Status: AC
  Filled 2021-06-11: qty 28.35

## 2021-06-11 MED ORDER — ORAL CARE MOUTH RINSE: 15.0000 mL | Freq: Once | OROMUCOSAL | Status: AC

## 2021-06-11 MED ORDER — MENTHOL 3 MG MT LOZG: 1.0000 | LOZENGE | OROMUCOSAL | Status: DC | PRN

## 2021-06-11 MED ORDER — ATORVASTATIN CALCIUM 10 MG PO TABS: 20.0000 mg | ORAL_TABLET | Freq: Every day | ORAL | Status: DC

## 2021-06-11 MED ORDER — CEFAZOLIN SODIUM-DEXTROSE 2-4 GM/100ML-% IV SOLN
2.0000 g | INTRAVENOUS | Status: DC
  Filled 2021-06-11: qty 100

## 2021-06-11 MED ORDER — LIDOCAINE 2% (20 MG/ML) 5 ML SYRINGE
INTRAMUSCULAR | Status: AC
  Filled 2021-06-11: qty 5

## 2021-06-11 MED ORDER — FENTANYL CITRATE (PF) 100 MCG/2ML IJ SOLN
INTRAMUSCULAR | Status: AC
  Filled 2021-06-11: qty 2

## 2021-06-11 MED ORDER — LIDOCAINE 2% (20 MG/ML) 5 ML SYRINGE
INTRAMUSCULAR | Status: DC | PRN
Start: 2021-06-11 — End: 2021-06-11
  Administered 2021-06-11: 80 mg via INTRAVENOUS

## 2021-06-11 MED ORDER — LACTATED RINGERS IV SOLN: INTRAVENOUS | Status: DC

## 2021-06-11 MED ORDER — FENTANYL CITRATE (PF) 250 MCG/5ML IJ SOLN
INTRAMUSCULAR | Status: DC | PRN
  Administered 2021-06-11: 50 ug via INTRAVENOUS
  Administered 2021-06-11: 100 ug via INTRAVENOUS

## 2021-06-11 MED ORDER — CEFAZOLIN SODIUM-DEXTROSE 1-4 GM/50ML-% IV SOLN
INTRAVENOUS | Status: DC | PRN
Start: 2021-06-11 — End: 2021-06-11
  Administered 2021-06-11: 2 g via INTRAVENOUS

## 2021-06-11 MED ORDER — THROMBIN 5000 UNITS EX SOLR
CUTANEOUS | Status: DC | PRN
  Administered 2021-06-11: 5000 [IU] via TOPICAL

## 2021-06-11 MED ORDER — GLYCOPYRROLATE 0.2 MG/ML IJ SOLN
INTRAMUSCULAR | Status: DC | PRN
  Administered 2021-06-11: .2 mg via INTRAVENOUS

## 2021-06-11 MED ORDER — ONDANSETRON HCL 4 MG PO TABS: 4.0000 mg | ORAL_TABLET | Freq: Four times a day (QID) | ORAL | Status: DC | PRN

## 2021-06-11 MED ORDER — PHENYLEPHRINE HCL-NACL 20-0.9 MG/250ML-% IV SOLN
INTRAVENOUS | Status: DC | PRN
Start: 2021-06-11 — End: 2021-06-11
  Administered 2021-06-11: 50 ug/min via INTRAVENOUS

## 2021-06-11 MED ORDER — ACETAMINOPHEN 325 MG PO TABS: 650.0000 mg | ORAL_TABLET | ORAL | Status: DC | PRN

## 2021-06-11 MED ORDER — LIDOCAINE-EPINEPHRINE 1 %-1:100000 IJ SOLN
INTRAMUSCULAR | Status: AC
  Filled 2021-06-11: qty 1

## 2021-06-11 MED ORDER — SUGAMMADEX SODIUM 200 MG/2ML IV SOLN
INTRAVENOUS | Status: DC | PRN
  Administered 2021-06-11: 300 mg via INTRAVENOUS

## 2021-06-11 MED ORDER — LACTATED RINGERS IV SOLN: INTRAVENOUS | Status: DC | PRN

## 2021-06-11 MED ORDER — SUCCINYLCHOLINE CHLORIDE 200 MG/10ML IV SOSY
PREFILLED_SYRINGE | INTRAVENOUS | Status: DC | PRN
Start: 2021-06-11 — End: 2021-06-11
  Administered 2021-06-11: 100 mg via INTRAVENOUS

## 2021-06-11 MED ORDER — PREDNISONE 20 MG PO TABS: 20.0000 mg | ORAL_TABLET | Freq: Every day | ORAL | Status: DC

## 2021-06-11 MED ORDER — THROMBIN 5000 UNITS EX SOLR: OROMUCOSAL | Status: DC | PRN

## 2021-06-11 MED ORDER — ISOSORBIDE MONONITRATE ER 30 MG PO TB24: 30.0000 mg | ORAL_TABLET | Freq: Every day | ORAL | Status: DC

## 2021-06-11 MED ORDER — DEXMEDETOMIDINE (PRECEDEX) IN NS 20 MCG/5ML (4 MCG/ML) IV SYRINGE
PREFILLED_SYRINGE | INTRAVENOUS | Status: DC | PRN
  Administered 2021-06-11 (×2): 10 ug via INTRAVENOUS

## 2021-06-11 MED ORDER — ONDANSETRON HCL 4 MG/2ML IJ SOLN
INTRAMUSCULAR | Status: AC
  Filled 2021-06-11: qty 2

## 2021-06-11 MED ORDER — EPHEDRINE 5 MG/ML INJ
INTRAVENOUS | Status: AC
  Filled 2021-06-11: qty 10

## 2021-06-11 MED ORDER — OXYCODONE-ACETAMINOPHEN 5-325 MG PO TABS
1.0000 | ORAL_TABLET | Freq: Three times a day (TID) | ORAL | Status: DC | PRN
Start: 2021-06-11 — End: 2021-06-12

## 2021-06-11 MED ORDER — ROCURONIUM BROMIDE 10 MG/ML (PF) SYRINGE
PREFILLED_SYRINGE | INTRAVENOUS | Status: AC
  Filled 2021-06-11: qty 10

## 2021-06-11 MED ORDER — SUCCINYLCHOLINE CHLORIDE 200 MG/10ML IV SOSY
PREFILLED_SYRINGE | INTRAVENOUS | Status: AC
  Filled 2021-06-11: qty 10

## 2021-06-11 MED ORDER — SODIUM CHLORIDE 0.9 % IV SOLN
250.0000 mL | INTRAVENOUS | Status: DC
  Administered 2021-06-11: 250 mL via INTRAVENOUS

## 2021-06-11 MED ORDER — FUROSEMIDE 20 MG PO TABS: 20.0000 mg | ORAL_TABLET | Freq: Every day | ORAL | Status: DC | PRN

## 2021-06-11 SURGICAL SUPPLY — 54 items
ADH SKN CLS APL DERMABOND .7 (GAUZE/BANDAGES/DRESSINGS) ×1
BAG COUNTER SPONGE SURGICOUNT (BAG) ×2 IMPLANT
BAG SPNG CNTER NS LX DISP (BAG) ×1
BAND INSRT 18 STRL LF DISP RB (MISCELLANEOUS) ×2
BAND RUBBER #18 3X1/16 STRL (MISCELLANEOUS) ×2 IMPLANT
BLADE CLIPPER SURG (BLADE) IMPLANT
BLADE SURG 15 STRL LF DISP TIS (BLADE) ×1 IMPLANT
BLADE SURG 15 STRL SS (BLADE) ×2
BUR PRECISION MATCH 3.0 13 (BURR) ×2 IMPLANT
CANISTER SUCT 3000ML PPV (MISCELLANEOUS) ×2 IMPLANT
CARTRIDGE OIL MAESTRO DRILL (MISCELLANEOUS) ×1 IMPLANT
COVER MAYO STAND STRL (DRAPES) IMPLANT
DECANTER SPIKE VIAL GLASS SM (MISCELLANEOUS) ×1 IMPLANT
DERMABOND ADVANCED (GAUZE/BANDAGES/DRESSINGS) ×1
DERMABOND ADVANCED .7 DNX12 (GAUZE/BANDAGES/DRESSINGS) ×2 IMPLANT
DIFFUSER DRILL AIR PNEUMATIC (MISCELLANEOUS) ×1 IMPLANT
DRAPE C-ARM 42X72 X-RAY (DRAPES) IMPLANT
DRAPE LAPAROTOMY 100X72 PEDS (DRAPES) ×2 IMPLANT
DRAPE MICROSCOPE LEICA (MISCELLANEOUS) ×2 IMPLANT
DURAPREP 6ML APPLICATOR 50/CS (WOUND CARE) ×2 IMPLANT
ELECT BLADE 4.0 EZ CLEAN MEGAD (MISCELLANEOUS) ×2
ELECT REM PT RETURN 9FT ADLT (ELECTROSURGICAL) ×2
ELECTRODE BLDE 4.0 EZ CLN MEGD (MISCELLANEOUS) ×1 IMPLANT
ELECTRODE REM PT RTRN 9FT ADLT (ELECTROSURGICAL) ×1 IMPLANT
GAUZE 4X4 16PLY ~~LOC~~+RFID DBL (SPONGE) IMPLANT
GLOVE EXAM NITRILE XL STR (GLOVE) IMPLANT
GLOVE SURG LTX SZ8.5 (GLOVE) ×3 IMPLANT
GLOVE SURG UNDER POLY LF SZ8.5 (GLOVE) ×3 IMPLANT
GOWN STRL REUS W/ TWL LRG LVL3 (GOWN DISPOSABLE) IMPLANT
GOWN STRL REUS W/ TWL XL LVL3 (GOWN DISPOSABLE) IMPLANT
GOWN STRL REUS W/TWL 2XL LVL3 (GOWN DISPOSABLE) ×2 IMPLANT
GOWN STRL REUS W/TWL LRG LVL3 (GOWN DISPOSABLE) ×2
GOWN STRL REUS W/TWL XL LVL3 (GOWN DISPOSABLE) ×2
HEMOSTAT POWDER KIT SURGIFOAM (HEMOSTASIS) ×2 IMPLANT
KIT BASIN OR (CUSTOM PROCEDURE TRAY) ×2 IMPLANT
KIT TURNOVER KIT B (KITS) ×2 IMPLANT
NDL HYPO 18GX1.5 BLUNT FILL (NEEDLE) IMPLANT
NDL SPNL 20GX3.5 QUINCKE YW (NEEDLE) IMPLANT
NEEDLE HYPO 18GX1.5 BLUNT FILL (NEEDLE) IMPLANT
NEEDLE HYPO 22GX1.5 SAFETY (NEEDLE) ×2 IMPLANT
NEEDLE SPNL 20GX3.5 QUINCKE YW (NEEDLE) IMPLANT
NS IRRIG 1000ML POUR BTL (IV SOLUTION) ×2 IMPLANT
OIL CARTRIDGE MAESTRO DRILL (MISCELLANEOUS)
PACK LAMINECTOMY NEURO (CUSTOM PROCEDURE TRAY) ×2 IMPLANT
PAD ARMBOARD 7.5X6 YLW CONV (MISCELLANEOUS) ×7 IMPLANT
PENCIL BUTTON HOLSTER BLD 10FT (ELECTRODE) ×1 IMPLANT
PIN MAYFIELD SKULL DISP (PIN) ×2 IMPLANT
SPONGE SURGIFOAM ABS GEL SZ50 (HEMOSTASIS) ×2 IMPLANT
SUT VIC AB 3-0 SH 8-18 (SUTURE) ×2 IMPLANT
SUT VIC AB 4-0 RB1 18 (SUTURE) ×2 IMPLANT
SYR 5ML LL (SYRINGE) IMPLANT
TOWEL GREEN STERILE (TOWEL DISPOSABLE) ×2 IMPLANT
TOWEL GREEN STERILE FF (TOWEL DISPOSABLE) ×2 IMPLANT
WATER STERILE IRR 1000ML POUR (IV SOLUTION) ×2 IMPLANT

## 2021-06-11 NOTE — Transfer of Care (Signed)
Immediate Anesthesia Transfer of Care Note  Patient: Levi [name]  Procedure(s) Performed: Right Cervical six-seven  Laminectomy and foraminotomy with metrex (Right: Spine Cervical)  Patient Location: PACU  Anesthesia Type:General  Level of Consciousness: awake, alert  and oriented  Airway & Oxygen Therapy: Patient Spontanous Breathing and Patient connected to nasal cannula oxygen  Post-op Assessment: Report given to RN and Post -op Vital signs reviewed and stable  Post vital signs: Reviewed and stable  Last Vitals:  Vitals Value Taken Time  BP 142/79 06/11/21 1431  Temp    Pulse 91 06/11/21 1432  Resp 18 06/11/21 1432  SpO2 99 % 06/11/21 1432  Vitals shown include unvalidated device data.  Last Pain:  Vitals:   06/11/21 1016  TempSrc:   PainSc: 7       Patients Stated Pain Goal: 3 (12/39/35 9409)  Complications: No notable events documented.

## 2021-06-11 NOTE — Op Note (Signed)
Date of surgery: 06/11/2021 Preoperative diagnosis: Spondylosis with radiculopathy C6-C7 right Status post arthroplasty C6-C7 Postoperative diagnosis: Same Procedure: Laminotomy and foraminotomies C6-C7 right with Metrix retractor operating microscope fluoroscopic guidance Surgeon: [name] First Assistant: Levi Gell, MD Anesthesia: General endotracheal Indications: Levi [name] is a 76 year old individual who had an arthroplasty couple years ago C6-C7 level he has had recurrence of the right shoulder and arm pain with neck pain primarily that has been unrelenting.  A CT scan demonstrates that he has foraminal stenosis on the right side at C6-C7 level this felt that some of his symptoms are related indeed to a C7 radiculopathy.  He has been advised regarding doing a laminotomy and foraminotomies using a Metrix technique.  Procedure: Patient was brought to the operating room supine on the stretcher.  After the smooth induction of general endotracheal anesthesia, he was carefully turned prone with with his head in the 3 pin headrest.  The bed was secured with his head prone and slightly flexed and with him in a rather jackknife position we then used fluoroscopy to localize a C6-C7 level after taping his shoulders inferiorly.  Once the area was secured the neck was prepped with alcohol DuraPrep and draped in a sterile fashion.  Fluoroscopic guidance was used to mark an area for an entry incision measuring approximately 2 cm in length.  After infiltrating with lidocaine and epinephrine mixed 50-50 with half percent Marcaine a small incision was created and a K wire was passed down to the facet capsules at C6-C7 level.  A winding technique was then used to open this area slightly and allow a series of dilators to be passed to the 18 mm size.  Ultimately a 6 sonometer long 18 mm wide Metrix cannula was placed as a retractor in this area and connected to the operating table with a clamp.  Then the microscope  was draped and brought into the field and through the sole aperture we cleared the soft tissues over the interlaminar space at the C6-C7 level.  Laminotomy was then created removing the inferior margin lamina of C7 and C6 out to expose the common dural tube and the path of the C7 nerve root C7 nerve root was decompressed and largely this was performed by doing resection of the superior articular process of the C7 vertebrae which created some of the stenosis in the foramen.  Once this was removed the C7 nerve root could easily be sounded out the foramen.  Hemostasis in the soft tissues was obtained meticulously and when verified the area was inspected again carefully care was taken to make sure the C7 nerve root was well decompressed and then the Metrix tube was removed and 10 cc of half percent Marcaine was injected into the paraspinous muscle and fascia.  The fascia was closed with 3-0 Vicryl in the fascia and 3-0 Vicryl in subcuticular tissues and Dermabond was placed on the skin. the patient tolerated procedure well is returned to recovery room in stable condition.

## 2021-06-11 NOTE — Discharge Summary (Signed)
Physician Discharge Summary  Patient ID: Levi [name] MRN: [id number] DOB/AGE: Sep 23, 1945 76 y.o.  Admit date: 06/11/2021 Discharge date: 06/11/2021  Admission Diagnoses: Right C7 radiculopathy.  History of arthroplasty C6-C7  Discharge Diagnoses: Right C7 radiculopathy, history of arthroplasty C6-C7 Principal Problem:   Cervical radicular pain   Discharged Condition: good  Hospital Course: Patient was admitted undergo surgery which he tolerated well  Consults:  None  Significant Diagnostic Studies: None  Treatments: surgery: See op note  Discharge Exam: Blood pressure (!) 151/74, pulse 71, temperature 97.7 F (36.5 C), resp. rate 13, height 6\' 2"  (1.88 m), weight 81.6 kg, SpO2 100 %. Incision is clean and dry motor function is intact in the upper and lower extremities.  Station and gait are intact.  Disposition: Discharge disposition: 01-Home or Self Care       Discharge Instructions     Call MD for:  redness, tenderness, or signs of infection (pain, swelling, redness, odor or green/yellow discharge around incision site)   Complete by: As directed    Call MD for:  severe uncontrolled pain   Complete by: As directed    Call MD for:  temperature >100.4   Complete by: As directed    Diet - low sodium heart healthy   Complete by: As directed    Discharge wound care:   Complete by: As directed    Okay to shower. Do not apply salves or appointments to incision. No heavy lifting with the upper extremities greater than 10 pounds. May resume driving when not requiring pain medication and patient feels comfortable with doing so.   Incentive spirometry RT   Complete by: As directed    Increase activity slowly   Complete by: As directed       Allergies as of 06/11/2021       Reactions   Morphine And Related Palpitations, Other (See Comments)   Made patient light-headed and sweaty; "my heart races" (04/28/2012), pt states too much morphine gave him this reaction    Levaquin [levofloxacin] Itching        Medication List     TAKE these medications    albuterol 108 (90 Base) MCG/ACT inhaler Commonly known as: VENTOLIN HFA INHALE 2 PUFFS INTO THE LUNGS EVERY 4 (FOUR) HOURS AS NEEDED FOR WHEEZING OR SHORTNESS OF BREATH.   allopurinol 100 MG tablet Commonly known as: ZYLOPRIM Take 100 mg by mouth daily.   apixaban 5 MG Tabs tablet Commonly known as: ELIQUIS Take 1 tablet (5 mg total) by mouth 2 (two) times daily.   atorvastatin 20 MG tablet Commonly known as: LIPITOR TAKE 1 TABLET BY MOUTH EVERY DAY   candesartan 4 MG tablet Commonly known as: ATACAND TAKE 1/2 TABLETS (2 MG TOTAL) BY MOUTH DAILY.   Compressor/Nebulizer Misc Use as directed   cyclobenzaprine 10 MG tablet Commonly known as: FLEXERIL Take 1 tablet (10 mg total) by mouth 3 (three) times daily as needed for muscle spasms. What changed: when to take this   diclofenac Sodium 1 % Gel Commonly known as: VOLTAREN Apply 2 g topically daily as needed (Pain).   diltiazem 180 MG 24 hr capsule Commonly known as: CARDIZEM CD TAKE 1 CAPSULE BY MOUTH EVERY DAY   diltiazem 30 MG tablet Commonly known as: CARDIZEM Take 30 mg by mouth as needed (heart rate over 120).   DULoxetine 60 MG capsule Commonly known as: Cymbalta Take 1 capsule (60 mg total) by mouth daily.   furosemide 20 MG tablet Commonly known  as: LASIX Take 1 tablet (20 mg total) by mouth daily as needed. For shortness of breath and lower limb edema   ipratropium-albuterol 0.5-2.5 (3) MG/3ML Soln Commonly known as: DUONEB 1 NEBULE EVERY 6 HOURS AS NEEDED. **J45.3**   isosorbide mononitrate 30 MG 24 hr tablet Commonly known as: IMDUR TAKE 1 TABLET (30 MG TOTAL) BY MOUTH DAILY.   omeprazole 40 MG capsule Commonly known as: PRILOSEC Take 1 capsule (40 mg total) by mouth daily.   oxyCODONE-acetaminophen 5-325 MG tablet Commonly known as: PERCOCET/ROXICET Take 1 tablet by mouth every 4 (four) hours as needed  for severe pain. What changed: when to take this   predniSONE 10 MG tablet Commonly known as: DELTASONE TAKE 2 TABLETS (20 MG TOTAL) BY MOUTH DAILY. CONTINUOUS   Striverdi Respimat 2.5 MCG/ACT Aers Generic drug: Olodaterol HCl INHALE 2 PUFFS BY MOUTH INTO THE LUNGS DAILY   tamsulosin 0.4 MG Caps capsule Commonly known as: FLOMAX Take 0.4 mg by mouth at bedtime.               Discharge Care Instructions  (From admission, onward)           Start     Ordered   06/11/21 0000  Discharge wound care:       Comments: Okay to shower. Do not apply salves or appointments to incision. No heavy lifting with the upper extremities greater than 10 pounds. May resume driving when not requiring pain medication and patient feels comfortable with doing so.   06/11/21 1711             Signed: Blanchie Dessert Niambi Smoak 06/11/2021, 5:11 PM

## 2021-06-11 NOTE — Interval H&P Note (Signed)
History and Physical Interval Note:  06/11/2021 12:06 PM  Levi [name]  has presented today for surgery, with the diagnosis of Right cervical radiculopathy.  The various methods of treatment have been discussed with the patient and family. After consideration of risks, benefits and other options for treatment, the patient has consented to  Procedure(s): Right C6-7 Laminectomy and foraminotomy (Right) as a surgical intervention.  The patient's history has been reviewed, patient examined, no change in status, stable for surgery.  I have reviewed the patient's chart and labs.  Questions were answered to the patient's satisfaction.     [name]

## 2021-06-11 NOTE — H&P (Signed)
Levi [name] is an 76 y.o. male.   Chief                                                       Complaint: Right neck and shoulder pain                                                                                                  HPI: Patient is a 76 year old individuals had extensive spondylitic disease in the cervical spine and lumbar spines.  Patient has had multilevel fusion had an arthroplasty at the C6-C7 level.  Recently he has developed significant right shoulder and neck pain that has been refractory to conservative management.  X-rays and a CT scan demonstrated presence of significant facet arthropathy on the right side at the C6-C7 level.  Patient is being admitted now to undergo surgical decompression of the foramen on the right side at the C6-C7 level using a Metrix technique.  Past Medical History:  Diagnosis Date   Agent orange exposure 1970's   takes Imdur,Diltiazem, and Atacand daily   ALLERGIC RHINITIS    Allergy    seasonal allergies   Asthma    uses inhaler and nebulizers   Bruises easily    d/t prednisone daily   Cataract    CHF (congestive heart failure) (HCC)    dx-on meds to treat   Chronic bronchitis ([place])    "used to get it q yr; last time ?2008" (04/28/2012)   COPD (chronic obstructive pulmonary disease) (HCC)    agent orange exposure   Degenerative disk disease    "everywhere" (04/28/2012)   Diverticulitis    Dysrhythmia    afib   Elevated uric acid in blood    takes Allopurinol daily   Emphysema of lung ([place])    Enlarged prostate    but not on any meds   GERD (gastroesophageal reflux disease)    takes Protonix daily   H/O hiatal hernia    Headache    History of pneumonia    a. 2010   Hyperlipidemia    takes Lipitor daily; pt. states he takes a preventive   Hypothyroidism    Joint pain    Joint swelling    Neuromuscular disorder ([place])    bilat legs, and bilat arms   PAF (paroxysmal atrial fibrillation) ([place])    a. Dx 04/2012, CHA2DS2VASc = 1  (age);  b. 04/2012 Echo: EF 40-50%, mild MR.   Personal history of colonic adenomas 02/09/2013   Pneumonia    Shortness of breath dyspnea    Thyroid disease    no taking meds-caused dizziness    Past Surgical History:  Procedure Laterality Date   53 HOUR [place] STUDY N/A 05/30/2021   Procedure: 24 HOUR [place] STUDY;  Surgeon: Lavena Bullion, DO;  Location: [facility];  Service: Gastroenterology;  Laterality: N/A;   ANTERIOR CERVICAL DECOMP/DISCECTOMY FUSION N/A 07/21/2012   Procedure: ANTERIOR  CERVICAL DECOMPRESSION/DISCECTOMY FUSION 2 LEVELS;  Surgeon: [name], MD;  Location: [facility];  Service: Neurosurgery;  Laterality: N/A;  C4-5 C5-6 Anterior cervical decompression/diskectomy/fusion   ANTERIOR LAT LUMBAR FUSION N/A 04/07/2017   Procedure: Thoracic twelve-Lumbar one Anterolateral decompression/fusion;  Surgeon: [name], MD;  Location: [facility];  Service: Neurosurgery;  Laterality: N/A;   BACK SURGERY     x 3   CARDIAC CATHETERIZATION  11/2008   [name] - 20% calcified non flow limiting left main, 50% EF apical hypokinesis   CERVICAL DISC ARTHROPLASTY N/A 04/07/2017   Procedure: Cervical six-seven Disc arthroplasty;  Surgeon: [name], MD;  Location: [facility];  Service: Neurosurgery;  Laterality: N/A;   CHEST TUBE INSERTION Left 04/11/2017   Procedure: CHEST TUBE INSERTION;  Surgeon: [name], MD;  Location: [facility];  Service: Thoracic;  Laterality: Left;   CHOLECYSTECTOMY     COLONOSCOPY  2016   CG-MAC-miralax(good)servere TICS/TA x 2   ESOPHAGEAL MANOMETRY N/A 05/30/2021   Procedure: ESOPHAGEAL MANOMETRY (EM);  Surgeon: Lavena Bullion, DO;  [facility]: [facility];  Service: Gastroenterology;  Laterality: N/A;  ph impedence   ESOPHAGOGASTRODUODENOSCOPY     EYE SURGERY Bilateral 2011   steroidal encapsulation" (04/28/2012)   FOOT SURGERY Right    x 2   KNEE ARTHROSCOPY Bilateral    LATERAL / POSTERIOR COMBINED FUSION LUMBAR SPINE  2012   LEFT ATRIAL APPENDAGE  OCCLUSION N/A 02/22/2021   Procedure: LEFT ATRIAL APPENDAGE OCCLUSION;  Surgeon: Vickie Epley, MD;  Location: [facility];  Service: Cardiovascular;  Laterality: N/A;   POLYPECTOMY  2016   severe TICS/TA x 2   REVERSE SHOULDER ARTHROPLASTY  04/28/2012   Procedure: REVERSE SHOULDER ARTHROPLASTY;  Surgeon: [name], MD;  Location: [facility];  Service: Orthopedics;  Laterality: Left;  Left shouder reverse total shoulder arthroplasty   SHOULDER SURGERY     TEE WITHOUT CARDIOVERSION N/A 02/22/2021   Procedure: TRANSESOPHAGEAL ECHOCARDIOGRAM (TEE);  Surgeon: Vickie Epley, MD;  Location: [facility];  Service: Cardiovascular;  Laterality: N/A;   TENDON REPAIR Right 08/07/2020   Procedure: RIGHT HAND LIGAMENT RECONSTRUCTION AND TENDON INTERPOSITION;  Surgeon: [name], MD;  Location: [facility];  Service: Orthopedics;  Laterality: Right;   [place]   TOTAL HIP ARTHROPLASTY Left 2011   "left" (04/28/2012)   TOTAL HIP ARTHROPLASTY Right 07/31/2015   Procedure: TOTAL HIP ARTHROPLASTY ANTERIOR APPROACH;  Surgeon: [name], MD;  Location: [facility];  Service: Orthopedics;  Laterality: Right;   TOTAL KNEE ARTHROPLASTY  01/10/2012   Procedure: TOTAL KNEE ARTHROPLASTY;  Surgeon: [name], MD;  Location: [facility];  Service: Orthopedics;;  left total knee arthroplasty   TOTAL KNEE ARTHROPLASTY Right 06/17/2014   Procedure: TOTAL KNEE ARTHROPLASTY;  Surgeon: [name], MD;  Location: [facility];  Service: Orthopedics;  Laterality: Right;    Family History  Problem Relation Age of Onset   Heart disease Father        Died 74, MI   Diabetes Mother    Prostate cancer Brother    Colon polyps Brother    Colon polyps Brother    Colon cancer Other    Esophageal cancer Neg Hx    Rectal cancer Neg Hx    Stomach cancer Neg Hx    Pancreatic cancer Neg Hx    Kidney disease Neg Hx    Liver disease Neg Hx    Social History:  reports  that  he has never smoked. He has never used smokeless tobacco. He reports that he does not drink alcohol and does not use drugs.  Allergies:  Allergies  Allergen Reactions   Morphine And Related Palpitations and Other (See Comments)    Made patient light-headed and sweaty; "my heart races" (04/28/2012), pt states too much morphine gave him this reaction   Levaquin [Levofloxacin] Itching    No medications prior to admission.    No results found. However, due to the size of the patient record, not all encounters were searched. Please check Results Review for a complete set of results. No results found.  Review of Systems  Constitutional:  Positive for activity change.  Musculoskeletal:  Positive for neck pain and neck stiffness.  Skin: Negative.   Neurological:  Positive for numbness.  All other systems reviewed and are negative.  There were no vitals taken for this visit. Physical Exam Constitutional:      Appearance: Normal appearance. He is normal weight.  HENT:     Head: Normocephalic and atraumatic.     Right Ear: Tympanic membrane normal.     Left Ear: Tympanic membrane normal.     Nose: Nose normal.     Mouth/Throat:     Mouth: Mucous membranes are moist.  Eyes:     Extraocular Movements: Extraocular movements intact.     Pupils: Pupils are equal, round, and reactive to light.  Neck:     Comments: Decreased range of motion with positive Spurling maneuver on the right side. Cardiovascular:     Rate and Rhythm: Normal rate and regular rhythm.     Pulses: Normal pulses.     Heart sounds: Normal heart sounds.  Pulmonary:     Effort: Pulmonary effort is normal.     Breath sounds: Normal breath sounds.  Abdominal:     General: Abdomen is flat. Bowel sounds are normal.     Palpations: Abdomen is soft.  Musculoskeletal:     Comments: Marked limited range of motion secondary to multiple arthropathies in the hips knees and shoulder joints in addition to significant fusion  across the thoracolumbar junction and cervical spine  Skin:    Capillary Refill: Capillary refill takes less than 2 seconds.  Neurological:     General: No focal deficit present.     Mental Status: He is alert and oriented to person, place, and time.  Psychiatric:        Mood and Affect: Mood normal.        Behavior: Behavior normal.        Thought Content: Thought content normal.        Judgment: Judgment normal.     Assessment/Plan Right C6-C7 foraminal stenosis secondary to facet arthropathy.  Plan: Posterior decompression using Metrix technique C6-C7 right.  [name], MD 06/11/2021, 7:54 AM

## 2021-06-11 NOTE — Evaluation (Signed)
Occupational Therapy Evaluation and Discharge Patient Details Name: Levi [name] MRN: [id number] DOB: 1945-08-05 Today's Date: 06/11/2021   History of Present Illness Patient is a 76 yo male s/p right laminotomy and foraminotomies C6-C7   Clinical Impression   This 76 yo male admitted and underwent above presents to acute OT with all education completed, we will D/C from acute OT.      Recommendations for follow up therapy are one component of a multi-disciplinary discharge planning process, led by the attending physician.  Recommendations may be updated based on patient status, additional functional criteria and insurance authorization.   Follow Up Recommendations  No OT follow up    Assistance Recommended at Discharge Intermittent Supervision/Assistance  Patient can return home with the following A little help with bathing/dressing/bathroom    Functional Status Assessment  Patient has had a recent decline in their functional status and demonstrates the ability to make significant improvements in function in a reasonable and predictable amount of time. (can recover on his own without follow up OT, education completed)  Equipment Recommendations  None recommended by OT       Precautions / Restrictions Precautions Precautions: Cervical Precaution Booklet Issued: No Precaution Comments: [name] came into room while I was with pt and reports that patient can move his neck to his tolerance (no restrictions) Restrictions Weight Bearing Restrictions: No      Mobility Bed Mobility Overal bed mobility: Modified Independent                  Transfers Overall transfer level: Modified independent Equipment used: Quad cane               General transfer comment: We discussed the different ways he could go up and down his 3 steps (sideways one step at a time holding onto rail with both hands), up/down normally one step at a time holding on with only one hand, up/down  normally one step at a time with one hand on rail and one hand using quad cane.      Balance Overall balance assessment: Mild deficits observed, not formally tested (present prior to surgery)                                         ADL either performed or assessed with clinical judgement   ADL                                         General ADL Comments: Needs minmal A for dressing LB due to new neck surgery and he does not have the setup he has here that he has at home for LBD. Ambulation with quad cane at Mod I level     Vision Patient Visual Report: No change from baseline              Pertinent Vitals/Pain Pain Assessment Pain Assessment: No/denies pain     Hand Dominance Right   Extremity/Trunk Assessment Upper Extremity Assessment Upper Extremity Assessment: Overall WFL for tasks assessed           Communication Communication Communication: No difficulties   Cognition Arousal/Alertness: Awake/alert Behavior During Therapy: WFL for tasks assessed/performed Overall Cognitive Status: Within Functional Limits for tasks assessed  Home Living Family/patient expects to be discharged to:: Private residence Living Arrangements: Spouse/significant other Available Help at Discharge: Family;Available 24 hours/day Type of Home: House Home Access: Stairs to enter [employer] of Steps: 3 Entrance Stairs-Rails: Left Home Layout: Multi-level;Laundry or work area in Building surveyor of Steps: 12, but can go outside and enter with only 1 step   Bathroom Shower/Tub: Occupational psychologist: Handicapped height     Home Equipment: [place];Shower seat          Prior Functioning/Environment Prior Level of Function : Independent/Modified Independent                        OT Problem List: Decreased range of  motion;Impaired balance (sitting and/or standing)         OT Goals(Current goals can be found in the care plan section) Acute Rehab OT Goals Patient Stated Goal: to go home today         AM-PAC OT "6 Clicks" Daily Activity     Outcome Measure Help from another person eating meals?: None Help from another person taking care of personal grooming?: None Help from another person toileting, which includes using toliet, bedpan, or urinal?: None Help from another person bathing (including washing, rinsing, drying)?: A Little Help from another person to put on and taking off regular upper body clothing?: None Help from another person to put on and taking off regular lower body clothing?: A Little 6 Click Score: 22   End of Session Nurse Communication:  (pt ready to have his IV hooked back up and then he will be ready to go home)  Activity Tolerance: Patient tolerated treatment well Patient left:  (sitting EOB)  OT Visit Diagnosis: Other abnormalities of gait and mobility (R26.89)                Time: 7001-7494 OT Time Calculation (min): 22 min Charges:  OT General Charges $OT Visit: 1 Visit OT Evaluation $OT Eval Moderate Complexity: 1 Mod  Levi [name], OTR/L Acute [employer] Pager [phone] Office [phone]    Almon Register 06/11/2021, 5:31 PM

## 2021-06-11 NOTE — Interval H&P Note (Signed)
History and Physical Interval Note:  06/11/2021 12:03 PM  Levi [name]  has presented today for surgery, with the diagnosis of Right cervical radiculopathy.  The various methods of treatment have been discussed with the patient and family. After consideration of risks, benefits and other options for treatment, the patient has consented to  Procedure(s): Right C6-7 Laminectomy and foraminotomy (Right) as a surgical intervention.  The patient's history has been reviewed, patient examined, no change in status, stable for surgery.  I have reviewed the patient's chart and labs.  Questions were answered to the patient's satisfaction.     [name]

## 2021-06-11 NOTE — Plan of Care (Signed)
Adequately Ready for Discharge 

## 2021-06-11 NOTE — Anesthesia Postprocedure Evaluation (Signed)
Anesthesia Post Note  Patient: TAEQUAN STOCKHAUSEN  Procedure(s) Performed: Right Cervical six-seven  Laminectomy and foraminotomy with metrex (Right: Spine Cervical)     Patient location during evaluation: PACU Anesthesia Type: General Level of consciousness: awake and alert Pain management: pain level controlled Vital Signs Assessment: post-procedure vital signs reviewed and stable Respiratory status: spontaneous breathing, nonlabored ventilation, respiratory function stable and patient connected to nasal cannula oxygen Cardiovascular status: blood pressure returned to baseline and stable Postop Assessment: no apparent nausea or vomiting Anesthetic complications: no   No notable events documented.  Last Vitals:  Vitals:   06/11/21 1500 06/11/21 1530  BP: 132/83 (!) 151/74  Pulse: 77 71  Resp: 15 13  Temp: 36.5 C   SpO2: 99% 100%    Last Pain:  Vitals:   06/11/21 1500  TempSrc:   PainSc: 1                  Othello Sgroi P Sherronda Sweigert

## 2021-06-11 NOTE — Progress Notes (Signed)
Patient alert and oriented, mae's well, voiding adequate amount of urine, swallowing without difficulty, no c/o pain at time of discharge. Patient discharged home with family. Script and discharged instructions given to patient. Patient and family stated understanding of instructions given. Patient has an appointment with Dr. Elsner  

## 2021-06-11 NOTE — Progress Notes (Signed)
PT Cancellation Note  Patient Details Name: Levi [name] MRN: [id number] DOB: [birth year]   Cancelled Treatment:    Reason Eval/Treat Not Completed: PT screened, no needs identified, will sign off  OT evaluated and educated pt.  Spoke with OT who reports pt safe and mod I with quad cane which is baseline.  OT reports no PT needs for pt , PT will sign off at this time as education completed and pt demonstrates mobility safe to return home per OT.  Abran Richard, PT Acute Rehab Services Pager [phone] [facility] [place] 06/11/2021, 5:47 PM

## 2021-06-11 NOTE — Anesthesia Procedure Notes (Signed)
Procedure Name: Intubation Date/Time: 06/11/2021 12:58 PM Performed by: Tressie Ellis, CRNA Pre-anesthesia Checklist: Patient identified, Emergency Drugs available, Suction available and Patient being monitored Patient Re-evaluated:Patient Re-evaluated prior to induction Oxygen Delivery Method: Circle system utilized Preoxygenation: Pre-oxygenation with 100% oxygen Induction Type: IV induction Laryngoscope Size: Glidescope Grade View: Grade I Tube type: Oral Tube size: 7.0 mm Number of attempts: 1 Airway Equipment and Method: Stylet Placement Confirmation: ETT inserted through vocal cords under direct vision and positive ETCO2 Dental Injury: Teeth and Oropharynx as per pre-operative assessment

## 2021-06-12 ENCOUNTER — Encounter (HOSPITAL_COMMUNITY): Payer: Self-pay | Admitting: Neurological Surgery

## 2021-06-12 MED FILL — Thrombin (Recombinant) For Soln 5000 Unit: CUTANEOUS | Qty: 5000 | Status: AC

## 2021-06-14 ENCOUNTER — Telehealth: Payer: Self-pay | Admitting: Gastroenterology

## 2021-06-14 NOTE — Telephone Encounter (Signed)
Beth, has a copy of the report.

## 2021-06-14 NOTE — Telephone Encounter (Signed)
Patient requesting results from procedure done on 1/4. Please advise.

## 2021-06-15 DIAGNOSIS — Z87891 Personal history of nicotine dependence: Secondary | ICD-10-CM | POA: Diagnosis not present

## 2021-06-15 DIAGNOSIS — Z48811 Encounter for surgical aftercare following surgery on the nervous system: Secondary | ICD-10-CM | POA: Diagnosis not present

## 2021-06-15 DIAGNOSIS — M24541 Contracture, right hand: Secondary | ICD-10-CM | POA: Diagnosis not present

## 2021-06-15 DIAGNOSIS — E785 Hyperlipidemia, unspecified: Secondary | ICD-10-CM | POA: Diagnosis not present

## 2021-06-15 DIAGNOSIS — Z7901 Long term (current) use of anticoagulants: Secondary | ICD-10-CM | POA: Diagnosis not present

## 2021-06-15 DIAGNOSIS — N400 Enlarged prostate without lower urinary tract symptoms: Secondary | ICD-10-CM | POA: Diagnosis not present

## 2021-06-15 DIAGNOSIS — Z9181 History of falling: Secondary | ICD-10-CM | POA: Diagnosis not present

## 2021-06-15 DIAGNOSIS — K219 Gastro-esophageal reflux disease without esophagitis: Secondary | ICD-10-CM | POA: Diagnosis not present

## 2021-06-15 DIAGNOSIS — M109 Gout, unspecified: Secondary | ICD-10-CM | POA: Diagnosis not present

## 2021-06-15 DIAGNOSIS — I10 Essential (primary) hypertension: Secondary | ICD-10-CM | POA: Diagnosis not present

## 2021-06-15 NOTE — Telephone Encounter (Signed)
Esophageal Manometry results reviewed and notable for the following: - Normal pressures - Normal motility - No manometric evidence of hiatal hernia - Overall a normal esophageal manometry test  pH/impedance testing reviewed.  Procedure completed off acid suppression therapy and notable for the following: 1.  Abnormal esophageal acid exposure with percent time pH <4 of 9.6%.  DeMeester score 38.6 2.  Esophageal acid exposure abnormal in upright and supine position 3.  Overall reflux events are elevated, predominantly acid and weekly acid reflux episodes 4.  No symptom correlation for heartburn or chest pain based on SAP  These results demonstrate significant gastroesophageal acid reflux with a significant amount of time with pH <4 and elevated DeMeester score of 38.6 (normal <14.7).  Additionally, elevated number of reflux episodes (49) all of which had some element of acid.  While there was a significant amount of esophageal acid exposure, the symptoms of heartburn and chest pain did not actually correlate well with those reflux events.  Since he was having continued reflux symptoms at the time of his last office appointment despite omeprazole 40 mg bid, plan for empiric change to Protonix 40 mg bid.  Depending on clinical response, alternative options include trial of Dexilant or Aciphex.  Plan for follow-up in the GI clinic in 8+ weeks to allow time for assessment of medication change.

## 2021-06-15 NOTE — Telephone Encounter (Signed)
Called and spoke with patient in regards ot his esophageal manometry and 24 hour pH results. I discussed recommendations with patient and he was not happy. He states that Dr. Carlean Purl had him on Protonix before and they changed to Omeprazole. Pt states that he previously discussed wanting to have nissen-fundoplication. I told patient that a lot of times the pt has to have tried an failed other therapies before a surgery may be approved. Pt states that she has had all of the same tests before. HE states that his brother has similar symptoms and had surgery and is now doing better. Pt is hoping that he will have the same outcome. He states that he would love to enjoy some years without pain. He states that his reflux is really bad when he is lying down. Pt states that he feels like he is not getting anywhere and starting all over again. He stets that he had to wait until 1/4 to have the test done in the first place. I explained to him that the hospital is the only place that performs this test and they have been short staffed. He stated that it just seems like no one cares and wants him to wait another 8 weeks before anything is done. I again told him that you recommended a medication change, he again states that he has already taken this medication. Pt requested an appt to discuss things further. He has been scheduled for Monday, 06/18/21 at 3:20 pm. He is aware that this appt is at the [facility] office.

## 2021-06-18 ENCOUNTER — Encounter: Payer: Self-pay | Admitting: Gastroenterology

## 2021-06-18 ENCOUNTER — Ambulatory Visit (INDEPENDENT_AMBULATORY_CARE_PROVIDER_SITE_OTHER): Payer: Medicare Other | Admitting: Gastroenterology

## 2021-06-18 ENCOUNTER — Other Ambulatory Visit: Payer: Self-pay | Admitting: Cardiology

## 2021-06-18 ENCOUNTER — Other Ambulatory Visit: Payer: Self-pay

## 2021-06-18 VITALS — BP 134/80 | HR 72 | Ht 74.0 in | Wt 193.0 lb

## 2021-06-18 DIAGNOSIS — Z7901 Long term (current) use of anticoagulants: Secondary | ICD-10-CM

## 2021-06-18 DIAGNOSIS — I480 Paroxysmal atrial fibrillation: Secondary | ICD-10-CM

## 2021-06-18 DIAGNOSIS — K219 Gastro-esophageal reflux disease without esophagitis: Secondary | ICD-10-CM | POA: Diagnosis not present

## 2021-06-18 DIAGNOSIS — Z86010 Personal history of colonic polyps: Secondary | ICD-10-CM | POA: Diagnosis not present

## 2021-06-18 MED ORDER — CLENPIQ 10-3.5-12 MG-GM -GM/160ML PO SOLN: 1.0000 | ORAL | 0 refills | Status: DC

## 2021-06-18 NOTE — Telephone Encounter (Signed)
Prescription refill request for Eliquis received.  Indication: afib  Last office visit: [place], 02/26/2021 Scr:  0.93, 06/07/2021 Age: 76 yo  Weight: 81.6 kg   Refill sent.

## 2021-06-18 NOTE — Progress Notes (Signed)
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  Longstanding history of GERD with index symptoms of heartburn, regurgitation, waterbrash.  Also with noncardiac chest pain.  Symptoms can occur throughout the day, and rarely nocturnal symptoms.  Still with breakthrough reflux symptoms despite omeprazole 40 mg/day. Previously trialed Protonix.    Endoscopic Hx: -Colonoscopy in 01/2013 with 3 small tubular adenomas. -Colonoscopy in 01/2015 with 1 small tubular adenoma, with recommendation to repeat in 5 years -EGD (02/2019): Esophageal candidiasis (bxs negative though), benign distal esophageal stenosis (nondilated).  Treated with fluconazole -Colonoscopy (03/2020, Dr. Bryan Lemma): 6 tubular adenomas, pandiverticulosis, internal hemorrhoids.  Fair prep.  Repeat in 1 year - EGD (05/2020, Dr. Bryan Lemma): Benign stenosis at 105 cm dilated with 54Fr Venia Minks with mucosal disruption at 18 cm consistent with proximal stricture, esophageal candidiasis, mild gastritis - EGD (12/2020, Dr. Bryan Lemma): Normal esophagus, mild non-H. pylori gastritis, benign gastric polyp  - Esophageal Manometry (05/2021): Normal.  No manometric evidence of hiatal hernia - pH/Impedance (05/2021, off PPI): Abnormal esophageal acid exposure with percent time pH <4 in 9.6%.  DeMeester score 38.6.  Abnormal esophageal acid exposure in upright and supine positions.  No symptom correlation for heartburn or chest pain.  HPI:     Patient is a 76 y.o. male presenting to the Gastroenterology Clinic for follow-up.  Last seen by me on 04/11/2021 for ongoing evaluation of GERD.  Main symptomatic complaint is noncardiac chest pain.  Was further evaluated with Esophageal  Manometry (normal) and pH/impedance testing (off PPI) with elevated DeMeester score and abnormal esophageal acid exposure.  Had recommended changing omeprazole to Protonix 40 mg bid with consideration of Dexilant or Aciphex depending on clinical response.  Instead presents today to discuss antireflux surgical options.  Was also started on Cymbalta about that time for neuropathy.  Had not noticed any improvement in UTI symptoms with Cymbalta.  Today, he states main issue is pain the starts in MEG and moves up through the chest along with heartburn and regurgitation.  Did not want to try Protonix since this was previously ineffective.  Did have cervical spine surgery again earlier this month.  Has had multiple C-spine and lumbar surgeries in the past, including multilevel C-spine fusion and most recently C6-C7 posterior decompressive surgery for foraminal stenosis.  Has limited neck mobility.  GERD-HRQL score 38/50 (on PPI) and marked "dissatisfied" with current health condition of reflux.  Normal CBC and BMP earlier this month.  Last seen in the Cardiology clinic 03/15/2021.   Review of systems:     No chest pain, no SOB, no fevers, no urinary sx   Past Medical History:  Diagnosis Date   Agent orange exposure 1970's   takes Imdur,Diltiazem, and Atacand daily   ALLERGIC RHINITIS    Allergy    seasonal allergies   Asthma    uses inhaler and nebulizers   Bruises easily    d/t prednisone daily   Cataract    CHF (congestive heart failure) (HCC)    dx-on meds to treat   Chronic bronchitis ([place])    "used to get it q yr; last time ?2008" (04/28/2012)   COPD (chronic obstructive pulmonary disease) (HCC)    agent orange exposure   Degenerative disk disease    "everywhere" (04/28/2012)  Diverticulitis    Dysrhythmia    afib   Elevated uric acid in blood    takes Allopurinol daily   Emphysema of lung (HCC)    Enlarged prostate    but not on any meds   GERD (gastroesophageal reflux  disease)    takes Protonix daily   H/O hiatal hernia    Headache    History of pneumonia    a. 2010   Hyperlipidemia    takes Lipitor daily; pt. states he takes a preventive   Hypothyroidism    Joint pain    Joint swelling    Neuromuscular disorder (HCC)    bilat legs, and bilat arms   PAF (paroxysmal atrial fibrillation) ([place])    a. Dx 04/2012, CHA2DS2VASc = 1 (age);  b. 04/2012 Echo: EF 40-50%, mild MR.   Personal history of colonic adenomas 02/09/2013   Pneumonia    Shortness of breath dyspnea    Thyroid disease    no taking meds-caused dizziness    Patient's surgical history, family medical history, social history, medications and allergies were all reviewed in Epic    Current Outpatient Medications  Medication Sig Dispense Refill   albuterol (VENTOLIN HFA) 108 (90 Base) MCG/ACT inhaler INHALE 2 PUFFS INTO THE LUNGS EVERY 4 (FOUR) HOURS AS NEEDED FOR WHEEZING OR SHORTNESS OF BREATH. 18 each 12   allopurinol (ZYLOPRIM) 100 MG tablet Take 100 mg by mouth daily.     atorvastatin (LIPITOR) 20 MG tablet TAKE 1 TABLET BY MOUTH EVERY DAY 90 tablet 3   candesartan (ATACAND) 4 MG tablet TAKE 1/2 TABLETS (2 MG TOTAL) BY MOUTH DAILY. 45 tablet 3   cyclobenzaprine (FLEXERIL) 10 MG tablet Take 1 tablet (10 mg total) by mouth 3 (three) times daily as needed for muscle spasms. 30 tablet 1   diclofenac Sodium (VOLTAREN) 1 % GEL Apply 2 g topically daily as needed (Pain).     diltiazem (CARDIZEM CD) 180 MG 24 hr capsule TAKE 1 CAPSULE BY MOUTH EVERY DAY 90 capsule 3   diltiazem (CARDIZEM) 30 MG tablet Take 30 mg by mouth as needed (heart rate over 120).     DULoxetine (CYMBALTA) 60 MG capsule Take 1 capsule (60 mg total) by mouth daily. 90 capsule 3   ELIQUIS 5 MG TABS tablet TAKE 1 TABLET BY MOUTH TWICE A DAY 60 tablet 6   furosemide (LASIX) 20 MG tablet Take 1 tablet (20 mg total) by mouth daily as needed. For shortness of breath and lower limb edema 90 tablet 3   ipratropium-albuterol  (DUONEB) 0.5-2.5 (3) MG/3ML SOLN 1 NEBULE EVERY 6 HOURS AS NEEDED. **J45.3** 270 mL 4   isosorbide mononitrate (IMDUR) 30 MG 24 hr tablet TAKE 1 TABLET (30 MG TOTAL) BY MOUTH DAILY. 30 tablet 11   Nebulizers (COMPRESSOR/NEBULIZER) MISC Use as directed 1 each 0   omeprazole (PRILOSEC) 40 MG capsule Take 1 capsule (40 mg total) by mouth daily. 90 capsule 1   predniSONE (DELTASONE) 10 MG tablet TAKE 2 TABLETS (20 MG TOTAL) BY MOUTH DAILY. CONTINUOUS 30 tablet 5   STRIVERDI RESPIMAT 2.5 MCG/ACT AERS INHALE 2 PUFFS BY MOUTH INTO THE LUNGS DAILY 4 g 4   tamsulosin (FLOMAX) 0.4 MG CAPS capsule Take 0.4 mg by mouth at bedtime.   11   oxyCODONE-acetaminophen (PERCOCET/ROXICET) 5-325 MG tablet Take 1 tablet by mouth every 4 (four) hours as needed for severe pain. (Patient not taking: Reported on 06/18/2021) 30 tablet 0   No current facility-administered medications for  this visit.    Physical Exam:     BP 134/80    Pulse 72    Ht 6\' 2"  (1.88 m)    Wt 193 lb (87.5 kg)    BMI 24.78 kg/m   GENERAL:  Pleasant male in NAD PSYCH: : Cooperative, normal affect Musculoskeletal:  Normal muscle tone, normal strength NEURO: Alert and oriented x 3, no focal neurologic deficits   IMPRESSION and PLAN:    1) GERD 2) Noncardiac chest pain - Persistent reflux symptoms despite adequate trials of acid suppression therapy.  Suboptimal response to high-dose Protonix in the past, and does not wish to retrial that.  Patient very interested in antireflux surgical options.  We discussed options of Nissen fundoplication, partial fundoplication, TIF, [place].  Based on his history of multiple cervical spine surgeries and limited neck mobility, I do not think a TIF device would pass readily.  Instead, plan for surgical consultation to discuss laparoscopic fundoplication instead. - Referral to Dr. Redmond Pulling to discuss laparoscopic/robotic assisted partial fundoplication -Continue PPI - Continue antireflux lifestyle/dietary  modifications  3) History of colon polyps - Last colonoscopy was 2021 with tubular adenoma x6 and fair prep. - Repeat colonoscopy for ongoing surveillance  4) Atrial fibrillation 5) Chronic anticoagulation 6) COPD - Cardiology clearance for colonoscopy - Hold Eliquis 2 days before procedure - will instruct when and how to resume after procedure. Low but real risk of cardiovascular event such as heart attack, stroke, embolism, thrombosis or ischemia/infarct of other organs off Eliquis explained and need to seek urgent help if this occurs. The patient consents to proceed. Will communicate by phone or EMR with patient's prescribing provider to confirm that holding Eliquis is reasonable in this case  7) Dysphagia - Resolved with previous EGD with dilation   The indications, risks, and benefits of colonoscopy were explained to the patient in detail. Risks include but are not limited to bleeding, perforation, adverse reaction to medications, and cardiopulmonary compromise. Sequelae include but are not limited to the possibility of surgery, hospitalization, and mortality. The patient verbalized understanding and wished to proceed. All questions answered, referred to the scheduler and bowel prep ordered. Further recommendations pending results of the exam.          Lavena Bullion ,DO, FACG 06/18/2021, 3:16 PM

## 2021-06-18 NOTE — Patient Instructions (Addendum)
If you are age 75 or older, your body mass index should be between 23-30. Your Body mass index is 24.78 kg/m. If this is out of the aforementioned range listed, please consider follow up with your Primary Care Provider.  __________________________________________________________  The [facility] providers would like to encourage you to use [facility] to communicate with providers for non-urgent requests or questions.  Due to long hold times on the telephone, sending your provider a message by [facility] may be a faster and more efficient way to get a response.  Please allow 48 business hours for a response.  Please remember that this is for non-urgent requests.   Due to recent changes in healthcare laws, you may see the results of your imaging and laboratory studies on MyChart before your provider has had a chance to review them.  We understand that in some cases there may be results that are confusing or concerning to you. Not all laboratory results come back in the same time frame and the provider may be waiting for multiple results in order to interpret others.  Please give [place] 48 hours in order for your provider to thoroughly review all the results before contacting the office for clarification of your results.   You have been scheduled for a colonoscopy. Please follow written instructions given to you at your visit today.  Please pick up your prep supplies at the pharmacy within the next 1-3 days. If you use inhalers (even only as needed), please bring them with you on the day of your procedure.   You will be contaced by our office prior to your procedure for directions on holding your Eliquis.  If you do not hear from our office 1 week prior to your scheduled procedure, please call [phone] to discuss.   We have placed a referral for you at [facility]. If you do not hear from them within 2 weeks, please give [place] a call at [phone]  Thank you for choosing me and [facility].  Vito Cirigliano, D.O.

## 2021-06-19 ENCOUNTER — Telehealth: Payer: Self-pay

## 2021-06-19 DIAGNOSIS — I10 Essential (primary) hypertension: Secondary | ICD-10-CM | POA: Diagnosis not present

## 2021-06-19 DIAGNOSIS — M24541 Contracture, right hand: Secondary | ICD-10-CM | POA: Diagnosis not present

## 2021-06-19 DIAGNOSIS — M109 Gout, unspecified: Secondary | ICD-10-CM | POA: Diagnosis not present

## 2021-06-19 DIAGNOSIS — K219 Gastro-esophageal reflux disease without esophagitis: Secondary | ICD-10-CM | POA: Diagnosis not present

## 2021-06-19 DIAGNOSIS — Z48811 Encounter for surgical aftercare following surgery on the nervous system: Secondary | ICD-10-CM | POA: Diagnosis not present

## 2021-06-19 DIAGNOSIS — E785 Hyperlipidemia, unspecified: Secondary | ICD-10-CM | POA: Diagnosis not present

## 2021-06-19 NOTE — Telephone Encounter (Signed)
Patient with diagnosis of a Fib on Eliquis for anticoagulation.    Procedure: colonoscopy Date of procedure: 07/31/21   CHA2DS2-VASc Score = 3  This indicates a 3.2% annual risk of stroke. The patient's score is based upon: CHF History: 1 HTN History: 1 Diabetes History: 0 Stroke History: 0 Vascular Disease History: 0 Age Score: 1 Gender Score: 0    CrCl 86 mL/min Platelet count 197K  Per office protocol, patient can hold Eliquis for 2 days prior to procedure.

## 2021-06-19 NOTE — Telephone Encounter (Signed)
[facility] Pre-operative Risk Assessment     Request for surgical clearance:     Endoscopy Procedure  What type of surgery is being performed?     colonoscopy  When is this surgery scheduled?     07/31/2021  What type of clearance is required ?   Pharmacy  Are there any medications that need to be held prior to surgery and how long? Eliquis, 2 days  Practice name and name of physician performing surgery?      [facility]  What is your office phone and fax number?      Phone- [phone]  [phone]  Anesthesia type (None, local, MAC, general) ?       MAC

## 2021-06-19 NOTE — Telephone Encounter (Signed)
° °  Name: Levi [name]  DOB: Jun 15, 1945  MRN: [id number]   Primary Cardiologist: Candee Furbish, MD  Chart reviewed as part of pre-operative protocol coverage. We have been asked for guidance to hold eliquis. Per our clinical pharmacist:  Patient with diagnosis of a Fib on Eliquis for anticoagulation.     Procedure: colonoscopy Date of procedure: 07/31/21     CHA2DS2-VASc Score = 3  This indicates a 3.2% annual risk of stroke. The patient's score is based upon: CHF History: 1 HTN History: 1 Diabetes History: 0 Stroke History: 0 Vascular Disease History: 0 Age Score: 1 Gender Score: 0     CrCl 86 mL/min Platelet count 197K   Per office protocol, patient can hold Eliquis for 2 days prior to procedure.    I will route this recommendation to the requesting party via Epic fax function and remove from pre-op pool. Please call with questions.  Tami Lin Jermiah Soderman, PA 06/19/2021, 10:40 AM

## 2021-06-20 NOTE — Telephone Encounter (Signed)
Informed the patient to hold Eliquis for 2 days prior to procedure. Patient verbalized understanding.

## 2021-06-22 DIAGNOSIS — M109 Gout, unspecified: Secondary | ICD-10-CM | POA: Diagnosis not present

## 2021-06-22 DIAGNOSIS — K219 Gastro-esophageal reflux disease without esophagitis: Secondary | ICD-10-CM | POA: Diagnosis not present

## 2021-06-22 DIAGNOSIS — M24541 Contracture, right hand: Secondary | ICD-10-CM | POA: Diagnosis not present

## 2021-06-22 DIAGNOSIS — E785 Hyperlipidemia, unspecified: Secondary | ICD-10-CM | POA: Diagnosis not present

## 2021-06-22 DIAGNOSIS — I10 Essential (primary) hypertension: Secondary | ICD-10-CM | POA: Diagnosis not present

## 2021-06-22 DIAGNOSIS — Z48811 Encounter for surgical aftercare following surgery on the nervous system: Secondary | ICD-10-CM | POA: Diagnosis not present

## 2021-06-26 DIAGNOSIS — E785 Hyperlipidemia, unspecified: Secondary | ICD-10-CM | POA: Diagnosis not present

## 2021-06-26 DIAGNOSIS — M24541 Contracture, right hand: Secondary | ICD-10-CM | POA: Diagnosis not present

## 2021-06-26 DIAGNOSIS — M109 Gout, unspecified: Secondary | ICD-10-CM | POA: Diagnosis not present

## 2021-06-26 DIAGNOSIS — Z48811 Encounter for surgical aftercare following surgery on the nervous system: Secondary | ICD-10-CM | POA: Diagnosis not present

## 2021-06-26 DIAGNOSIS — R12 Heartburn: Secondary | ICD-10-CM

## 2021-06-26 DIAGNOSIS — K219 Gastro-esophageal reflux disease without esophagitis: Secondary | ICD-10-CM | POA: Diagnosis not present

## 2021-06-26 DIAGNOSIS — I10 Essential (primary) hypertension: Secondary | ICD-10-CM | POA: Diagnosis not present

## 2021-06-26 DIAGNOSIS — R0789 Other chest pain: Secondary | ICD-10-CM

## 2021-07-05 ENCOUNTER — Telehealth: Payer: Self-pay | Admitting: *Deleted

## 2021-07-05 DIAGNOSIS — M5030 Other cervical disc degeneration, unspecified cervical region: Secondary | ICD-10-CM | POA: Diagnosis not present

## 2021-07-05 DIAGNOSIS — I4891 Unspecified atrial fibrillation: Secondary | ICD-10-CM | POA: Diagnosis not present

## 2021-07-05 DIAGNOSIS — J45909 Unspecified asthma, uncomplicated: Secondary | ICD-10-CM | POA: Diagnosis not present

## 2021-07-05 DIAGNOSIS — Z7902 Long term (current) use of antithrombotics/antiplatelets: Secondary | ICD-10-CM | POA: Diagnosis not present

## 2021-07-05 DIAGNOSIS — K219 Gastro-esophageal reflux disease without esophagitis: Secondary | ICD-10-CM | POA: Diagnosis not present

## 2021-07-05 NOTE — Telephone Encounter (Signed)
° °  Pre-operative Risk Assessment    Patient Name: Levi [name]  DOB: [birth year] MRN: [id number]      Request for Surgical Clearance    Procedure:   HIATAL HERNIA REPAIR  Date of Surgery:  Clearance TBD                                 Surgeon:  DR. Greer Pickerel Surgeon's Group or Practice Name:  [facility] Phone number:  [phone] Fax number:  [phone] ATTN: Lindwood Coke, RN   Type of Clearance Requested:   - Medical  - Pharmacy:  Hold Apixaban (Eliquis)     Type of Anesthesia:  General    Additional requests/questions:    Levi [name]   07/05/2021, 10:22 AM

## 2021-07-05 NOTE — Telephone Encounter (Signed)
Clinical pharmacist to review Eliquis.  Patient was previously cleared for different procedure in December.

## 2021-07-07 ENCOUNTER — Emergency Department (INDEPENDENT_AMBULATORY_CARE_PROVIDER_SITE_OTHER): Payer: Medicare Other

## 2021-07-07 ENCOUNTER — Other Ambulatory Visit: Payer: Self-pay

## 2021-07-07 ENCOUNTER — Encounter: Payer: Self-pay | Admitting: Emergency Medicine

## 2021-07-07 ENCOUNTER — Emergency Department (INDEPENDENT_AMBULATORY_CARE_PROVIDER_SITE_OTHER)
Admission: EM | Admit: 2021-07-07 | Discharge: 2021-07-07 | Disposition: A | Discharge status: Home / Self Care | Payer: Medicare Other | Source: Home / Self Care

## 2021-07-07 DIAGNOSIS — R0781 Pleurodynia: Secondary | ICD-10-CM

## 2021-07-07 DIAGNOSIS — R079 Chest pain, unspecified: Secondary | ICD-10-CM | POA: Diagnosis not present

## 2021-07-07 MED ORDER — OXYCODONE-ACETAMINOPHEN 5-325 MG PO TABS
1.0000 | ORAL_TABLET | Freq: Two times a day (BID) | ORAL | 0 refills | Status: DC | PRN
Start: 2021-07-07 — End: 2021-07-24

## 2021-07-07 NOTE — ED Provider Notes (Signed)
[name]    CSN: [id number] Arrival date & time: 07/07/21  0803      History   Chief Complaint Chief Complaint  Patient presents with   Rib Injury    HPI Levi [name] is a 76 y.o. male.   HPI 76 year old male presents with right-sided rib pain for the past 2 days.  Patient sustained nondisplaced anterior right sixth rib fracture on 05/22/2021 secondary to fall at home.  Reports he has had no right-sided rib pain to minimal rib pain pain; however, for the past 2 days right-sided rib pain has been worsening.  PMH significant for COPD and CHF.  Patient denies known injury, cough, or dyspnea at rest.  Past Medical History:  Diagnosis Date   Agent orange exposure 1970's   takes Imdur,Diltiazem, and Atacand daily   ALLERGIC RHINITIS    Allergy    seasonal allergies   Asthma    uses inhaler and nebulizers   Bruises easily    d/t prednisone daily   Cataract    CHF (congestive heart failure) ([place])    dx-on meds to treat   Chronic bronchitis ([place])    "used to get it q yr; last time ?2008" (04/28/2012)   COPD (chronic obstructive pulmonary disease) (H[facility])    agent orange exposure   Degenerative disk disease    "everywhere" (04/28/2012)   Diverticulitis    Dysrhythmia    afib   Elevated uric acid in blood    takes Allopurinol daily   Emphysema of lung ([place])    Enlarged prostate    but not on any meds   GERD (gastroesophageal reflux disease)    takes Protonix daily   H/O hiatal hernia    Headache    History of pneumonia    a. 2010   Hyperlipidemia    takes Lipitor daily; pt. states he takes a preventive   Hypothyroidism    Joint pain    Joint swelling    Neuromuscular disorder ([place])    bilat legs, and bilat arms   PAF (paroxysmal atrial fibrillation) ([place])    a. Dx 04/2012, CHA2DS2VASc = 1 (age);  b. 04/2012 Echo: EF 40-50%, mild MR.   Personal history of colonic adenomas 02/09/2013   Pneumonia    Shortness of breath dyspnea    Thyroid disease    no  taking meds-caused dizziness    Patient Active Problem List   Diagnosis Date Noted   Heartburn    Atypical chest pain    Cervical radicular pain 06/11/2021   Chronic anticoagulation 03/15/2021   Peripheral neuropathy 02/22/2021   LV dysfunction 02/22/2021   Essential (primary) hypertension 11/15/2020   Arthritis of carpometacarpal [facility]) joint of right thumb 08/07/2020   Cellulitis 03/22/2020   COPD (chronic obstructive pulmonary disease) ([place]) 03/21/2020   Chest wall pain 09/21/2019   GERD (gastroesophageal reflux disease) 12/03/2017   Pneumothorax, left 04/11/2017   Lumbar stenosis with neurogenic claudication 04/07/2017   Primary osteoarthritis of right hip 07/31/2015   Primary osteoarthritis of right knee 06/17/2014   Thrush of mouth and esophagus ([place]) 04/03/2014   Palpitation 03/28/2014   Pseudoarthrosis of lumbar spine 06/15/2013   Hyperlipidemia    History of colonic polyps 02/09/2013   Atrial fibrillation ([place]) 05/11/2012   Osteoarthritis of left knee 01/10/2012   Seasonal and perennial allergic rhinitis 07/23/2010   Asthma, moderate persistent 03/06/2009    Past Surgical History:  Procedure Laterality Date   24 HOUR [place] STUDY N/A 05/30/2021  Procedure: [place] STUDY;  Surgeon: Lavena Bullion, DO;  [facility]: [facility];  Service: Gastroenterology;  Laterality: N/A;   ANTERIOR CERVICAL DECOMP/DISCECTOMY FUSION N/A 07/21/2012   Procedure: ANTERIOR CERVICAL DECOMPRESSION/DISCECTOMY FUSION 2 LEVELS;  Surgeon: [name], MD;  Location: [facility];  Service: Neurosurgery;  Laterality: N/A;  C4-5 C5-6 Anterior cervical decompression/diskectomy/fusion   ANTERIOR LAT LUMBAR FUSION N/A 04/07/2017   Procedure: Thoracic twelve-Lumbar one Anterolateral decompression/fusion;  Surgeon: [name], MD;  Location: [facility];  Service: Neurosurgery;  Laterality: N/A;   BACK SURGERY     x 3   CARDIAC CATHETERIZATION  11/2008   [name] - 20% calcified non flow limiting left  main, 50% EF apical hypokinesis   CERVICAL DISC ARTHROPLASTY N/A 04/07/2017   Procedure: Cervical six-seven Disc arthroplasty;  Surgeon: [name], MD;  Location: [facility];  Service: Neurosurgery;  Laterality: N/A;   CHEST TUBE INSERTION Left 04/11/2017   Procedure: CHEST TUBE INSERTION;  Surgeon: [name], MD;  Location: [facility];  Service: Thoracic;  Laterality: Left;   CHOLECYSTECTOMY     COLONOSCOPY  2016   CG-MAC-miralax(good)servere TICS/TA x 2   ESOPHAGEAL MANOMETRY N/A 05/30/2021   Procedure: ESOPHAGEAL MANOMETRY (EM);  Surgeon: Lavena Bullion, DO;  [facility];  Service: Gastroenterology;  Laterality: N/A;  ph impedence   ESOPHAGOGASTRODUODENOSCOPY     EYE SURGERY Bilateral 2011   steroidal encapsulation" (04/28/2012)   FOOT SURGERY Right    x 2   KNEE ARTHROSCOPY Bilateral    LATERAL / POSTERIOR COMBINED FUSION LUMBAR SPINE  2012   LEFT ATRIAL APPENDAGE OCCLUSION N/A 02/22/2021   Procedure: LEFT ATRIAL APPENDAGE OCCLUSION;  Surgeon: Vickie Epley, MD;  Location: [facility];  Service: Cardiovascular;  Laterality: N/A;   POLYPECTOMY  2016   severe TICS/TA x 2   POSTERIOR CERVICAL LAMINECTOMY WITH MET- RX Right 06/11/2021   Procedure: Right Cervical six-seven  Laminectomy and foraminotomy with metrex;  Surgeon: [name], MD;  Location: [facility];  Service: Neurosurgery;  Laterality: Right;   REVERSE SHOULDER ARTHROPLASTY  04/28/2012   Procedure: REVERSE SHOULDER ARTHROPLASTY;  Surgeon: [name], MD;  Location: [facility];  Service: Orthopedics;  Laterality: Left;  Left shouder reverse total shoulder arthroplasty   SHOULDER SURGERY     TEE WITHOUT CARDIOVERSION N/A 02/22/2021   Procedure: TRANSESOPHAGEAL ECHOCARDIOGRAM (TEE);  Surgeon: Vickie Epley, MD;  Location: [facility];  Service: Cardiovascular;  Laterality: N/A;   TENDON REPAIR Right 08/07/2020   Procedure: RIGHT HAND LIGAMENT RECONSTRUCTION AND TENDON INTERPOSITION;   Surgeon: [name], MD;  Location: [facility];  Service: Orthopedics;  Laterality: Right;   [place]   TOTAL HIP ARTHROPLASTY Left 2011   "left" (04/28/2012)   TOTAL HIP ARTHROPLASTY Right 07/31/2015   Procedure: TOTAL HIP ARTHROPLASTY ANTERIOR APPROACH;  Surgeon: [name], MD;  Location: [facility];  Service: Orthopedics;  Laterality: Right;   TOTAL KNEE ARTHROPLASTY  01/10/2012   Procedure: TOTAL KNEE ARTHROPLASTY;  Surgeon: [name], MD;  Location: [facility];  Service: Orthopedics;;  left total knee arthroplasty   TOTAL KNEE ARTHROPLASTY Right 06/17/2014   Procedure: TOTAL KNEE ARTHROPLASTY;  Surgeon: [name], MD;  Location: [facility];  Service: Orthopedics;  Laterality: Right;       Home Medications    Prior to Admission medications   Medication Sig Start Date End Date Taking? Authorizing Provider  oxyCODONE-acetaminophen (PERCOCET/ROXICET) 5-325 MG tablet  Take 1 tablet by mouth 2 (two) times daily as needed for severe pain. 07/07/21  Yes Eliezer Lofts, FNP  albuterol (VENTOLIN HFA) 108 (90 Base) MCG/ACT inhaler INHALE 2 PUFFS INTO THE LUNGS EVERY 4 (FOUR) HOURS AS NEEDED FOR WHEEZING OR SHORTNESS OF BREATH. 10/13/20   Baird Lyons D, MD  allopurinol (ZYLOPRIM) 100 MG tablet Take 100 mg by mouth daily.    [provider]  atorvastatin (LIPITOR) 20 MG tablet TAKE 1 TABLET BY MOUTH EVERY DAY 04/05/21   Jerline Pain, MD  candesartan (ATACAND) 4 MG tablet TAKE 1/2 TABLETS (2 MG TOTAL) BY MOUTH DAILY. 02/26/21   Jerline Pain, MD  cyclobenzaprine (FLEXERIL) 10 MG tablet Take 1 tablet (10 mg total) by mouth 3 (three) times daily as needed for muscle spasms. 06/11/21   [name], MD  diclofenac Sodium (VOLTAREN) 1 % GEL Apply 2 g topically daily as needed (Pain).    [provider]  diltiazem (CARDIZEM CD) 180 MG 24 hr capsule TAKE 1 CAPSULE BY MOUTH EVERY DAY 01/30/21   Jerline Pain, MD  diltiazem (CARDIZEM) 30 MG tablet Take  30 mg by mouth as needed (heart rate over 120).    [provider]  DULoxetine (CYMBALTA) 60 MG capsule Take 1 capsule (60 mg total) by mouth daily. 05/09/21   Lomax, Amy, NP  ELIQUIS 5 MG TABS tablet TAKE 1 TABLET BY MOUTH TWICE A DAY 06/18/21   Jerline Pain, MD  furosemide (LASIX) 20 MG tablet Take 1 tablet (20 mg total) by mouth daily as needed. For shortness of breath and lower limb edema 11/17/18   Jerline Pain, MD  ipratropium-albuterol (DUONEB) 0.5-2.5 (3) MG/3ML SOLN 1 NEBULE EVERY 6 HOURS AS NEEDED. **J45.3** 03/22/20   Young, Tarri Fuller D, MD  isosorbide mononitrate (IMDUR) 30 MG 24 hr tablet TAKE 1 TABLET (30 MG TOTAL) BY MOUTH DAILY. 02/07/21   Jerline Pain, MD  Nebulizers (COMPRESSOR/NEBULIZER) MISC Use as directed 12/01/17   Deneise Lever, MD  omeprazole (PRILOSEC) 40 MG capsule Take 1 capsule (40 mg total) by mouth daily. 02/12/21   Cirigliano, Vito V, DO  predniSONE (DELTASONE) 10 MG tablet TAKE 2 TABLETS (20 MG TOTAL) BY MOUTH DAILY. CONTINUOUS 04/20/21   Deneise Lever, MD  Sod Picosulfate-Mag Ox-Cit Acd [facility]) 10-3.5-12 MG-GM -GM/160ML SOLN Take 1 kit by mouth as directed. 06/18/21   Cirigliano, Vito V, DO  STRIVERDI RESPIMAT 2.5 MCG/ACT AERS INHALE 2 PUFFS BY MOUTH INTO THE LUNGS DAILY 02/25/21   Baird Lyons D, MD  tamsulosin (FLOMAX) 0.4 MG CAPS capsule Take 0.4 mg by mouth at bedtime.  10/13/15   [provider]    Family History Family History  Problem Relation Age of Onset   Heart disease Father        Died 74, MI   Diabetes Mother    Prostate cancer Brother    Colon polyps Brother    Colon polyps Brother    Colon cancer Other    Esophageal cancer Neg Hx    Rectal cancer Neg Hx    Stomach cancer Neg Hx    Pancreatic cancer Neg Hx    Kidney disease Neg Hx    Liver disease Neg Hx     Social History Social History   Tobacco Use   Smoking status: Never   Smokeless tobacco: Never  Vaping Use   Vaping Use: Never used  Substance Use  Topics   Alcohol use: No  Alcohol/week: 0.0 standard drinks   Drug use: No     Allergies   Morphine and related and Levaquin [levofloxacin]   Review of Systems Review of Systems  Musculoskeletal:        Right-sided rib pain x 2 days  All other systems reviewed and are negative.   Physical Exam Triage Vital Signs ED Triage Vitals  Enc Vitals Group     BP 07/07/21 0814 132/77     Pulse Rate 07/07/21 0814 68     Resp 07/07/21 0814 18     Temp 07/07/21 0814 (!) 97 F (36.1 C)     Temp Source 07/07/21 0814 Oral     SpO2 07/07/21 0814 99 %     Weight --      Height --      Head Circumference --      Peak Flow --      Pain Score 07/07/21 0817 8     Pain Loc --      Pain Edu? --      Excl. in [place]? --    No data found.  Updated Vital Signs BP 132/77 (BP Location: Right Arm)    Pulse 68    Temp (!) 97 F (36.1 C) (Oral)    Resp 18    SpO2 99%    Physical Exam Vitals and nursing note reviewed.  Constitutional:      General: He is not in acute distress.    Appearance: Normal appearance. He is normal weight. He is not ill-appearing.  HENT:     Head: Normocephalic and atraumatic.     Mouth/Throat:     Mouth: Mucous membranes are moist.     Pharynx: Oropharynx is clear.  Eyes:     Extraocular Movements: Extraocular movements intact.     Conjunctiva/sclera: Conjunctivae normal.     Pupils: Pupils are equal, round, and reactive to light.  Cardiovascular:     Rate and Rhythm: Normal rate and regular rhythm.     Pulses: Normal pulses.     Heart sounds: Normal heart sounds.  Pulmonary:     Effort: Pulmonary effort is normal.     Breath sounds: Normal breath sounds.  Musculoskeletal:     Cervical back: Normal range of motion and neck supple.     Comments: Right-sided rib cage (fifth through seventh rib): TTP, no soft tissue swelling, no deformity noted  Skin:    General: Skin is warm and dry.  Neurological:     General: No focal deficit present.     Mental Status:  He is alert and oriented to person, place, and time.     UC Treatments / Results  Labs (all labs ordered are listed, but only abnormal results are displayed) Labs Reviewed - No data to display  EKG   Radiology DG Chest 2 View  Result Date: 07/07/2021 CLINICAL DATA:  Pt states he broke a rib on right side in December. Yesterday pain returned and has been getting worse on right side. Denies known injury, cough or difficulty breathing. EXAM: CHEST - 2 VIEW COMPARISON:  05/22/2021. FINDINGS: Right-sided rib fracture noted on the exam from 05/22/2021 demonstrates healing. No new fractures. No bone lesions. Cardiac silhouette is normal in size. No mediastinal or hilar masses. No evidence of adenopathy. There are linear opacities in the lung bases consistent with subsegmental atelectasis. Remainder of the lungs is clear. No pneumothorax or pleural effusion. Stable changes from prior cervical and lumbar spine fusion and a left  shoulder arthroplasty. IMPRESSION: 1. No acute findings. 2. Healing right-sided sixth rib fracture.  No new fractures. Electronically Signed   By: Lajean Manes M.D.   On: 07/07/2021 09:00    Procedures Procedures (including critical care time)  Medications Ordered in UC Medications - No data to display  Initial Impression / Assessment and Plan / UC Course  I have reviewed the triage vital signs and the nursing notes.  Pertinent labs & imaging results that were available during my care of the patient were reviewed by me and considered in my medical decision making (see chart for details).     MDM: 1.  Rib pain on the right side-CXR revealed healing right sixth rib fracture with no new fractures, Rx'd Percocet. Advised/informed patient of today's chest x-ray results copy provided with this AVS. Advised patient may take medication sparingly as needed for right-sided rib pain.  Encouraged patient to increase daily water intake while taking this medication.  Patient  discharged home, hemodynamically stable. Final Clinical Impressions(s) / UC Diagnoses   Final diagnoses:  Rib pain on right side     Discharge Instructions      Advised/informed patient of today's chest x-ray results copy provided with this AVS. Advised patient may take medication sparingly as needed for right-sided rib pain.  Encouraged patient to increase daily water intake while taking this medication.     ED Prescriptions     Medication Sig Dispense Auth. Provider   oxyCODONE-acetaminophen (PERCOCET/ROXICET) 5-325 MG tablet Take 1 tablet by mouth 2 (two) times daily as needed for severe pain. 30 tablet Eliezer Lofts, FNP      I have reviewed the PDMP during this encounter.   Eliezer Lofts, [place] 07/07/21 [phone]

## 2021-07-07 NOTE — Discharge Instructions (Addendum)
Advised/informed patient of today's chest x-ray results copy provided with this AVS. Advised patient may take medication sparingly as needed for right-sided rib pain.  Encouraged patient to increase daily water intake while taking this medication.

## 2021-07-07 NOTE — ED Triage Notes (Signed)
Pt states he broke a rib on right side in December. Yesterday pain returned and has been getting worse on right side. Denies known injury, cough or difficulty breathing.

## 2021-07-09 DIAGNOSIS — S2231XA Fracture of one rib, right side, initial encounter for closed fracture: Secondary | ICD-10-CM | POA: Diagnosis not present

## 2021-07-09 NOTE — Telephone Encounter (Signed)
° °  Patient Name: Levi [name]  DOB: 1946-01-31 MRN: [id number]  Primary Cardiologist: Candee Furbish, MD  Chart reviewed as part of pre-operative protocol coverage. Returned patient's call. The patient affirms he has been doing well without any new cardiac symptoms. Able to exert over 4 METS walking, stairs, household chores without any CP or SOB. Therefore, based on ACC/AHA guidelines, the patient would be at acceptable risk for the planned procedure without further cardiovascular testing. The patient was advised that if he develops new symptoms prior to surgery to contact our office to arrange for a follow-up visit, and he verbalized understanding.  Per pharmD, "Per office protocol, patient can hold Eliquis for 2 days prior to procedure.   Patient will not need bridging with Lovenox (enoxaparin) around procedure."  Charlie Pitter, PA-C 07/09/2021, 1:24 PM

## 2021-07-09 NOTE — Telephone Encounter (Signed)
Follow Up:    Patient is returning Levi [name]'s call from today(07-09-21).

## 2021-07-09 NOTE — Telephone Encounter (Signed)
° °  Name: Levi [name]  DOB: 26-Sep-1945  MRN: [id number]   Primary Cardiologist: Candee Furbish, MD  Chart reviewed as part of pre-operative protocol coverage. Patient was contacted 07/09/2021 in reference to pre-operative risk assessment for pending surgery as outlined below.  Levi [name] was last seen 02/2021 by [name]. Primarily followed for history of atrial fibrillation and LV dysfunction. Echo 01/2021 EF 45-50%. Cath 2010 with minimal CAD (20% LAD) in setting of LV dysfunction. Pt was cleared for unrelated procedure 04/2021 and did well.Called pt to assess sx. Got VM. LMTCB.   Charlie Pitter, PA-C 07/09/2021, 11:59 AM

## 2021-07-09 NOTE — Telephone Encounter (Signed)
Patient with diagnosis of atrial fibrillation on Eliquis for anticoagulation.    Procedure: hiatal hernia repair Date of procedure: TBD   CHA2DS2-VASc Score = 3   This indicates a 3.2% annual risk of stroke. The patient's score is based upon: CHF History: 1 HTN History: 1 Diabetes History: 0 Stroke History: 0 Vascular Disease History: 0 Age Score: 1 Gender Score: 0   CrCl 83 Platelet count 197  Per office protocol, patient can hold Eliquis for 2 days prior to procedure.   Patient Levi [name] not need bridging with Lovenox (enoxaparin) around procedure.

## 2021-07-10 ENCOUNTER — Other Ambulatory Visit: Payer: Self-pay | Admitting: General Surgery

## 2021-07-10 DIAGNOSIS — K219 Gastro-esophageal reflux disease without esophagitis: Secondary | ICD-10-CM

## 2021-07-20 ENCOUNTER — Emergency Department (INDEPENDENT_AMBULATORY_CARE_PROVIDER_SITE_OTHER)
Admission: EM | Admit: 2021-07-20 | Discharge: 2021-07-20 | Disposition: A | Discharge status: Home / Self Care | Payer: Medicare Other | Source: Home / Self Care

## 2021-07-20 ENCOUNTER — Other Ambulatory Visit: Payer: Self-pay

## 2021-07-20 DIAGNOSIS — T148XXA Other injury of unspecified body region, initial encounter: Secondary | ICD-10-CM | POA: Diagnosis not present

## 2021-07-20 MED ORDER — AMOXICILLIN-POT CLAVULANATE 875-125 MG PO TABS: 1.0000 | ORAL_TABLET | Freq: Two times a day (BID) | ORAL | 0 refills | Status: DC

## 2021-07-20 NOTE — ED Provider Notes (Signed)
Levi [name] CARE    CSN: [id number] Arrival date & time: 07/20/21  1411      History   Chief Complaint No chief complaint on file.   HPI Levi [name] is a 76 y.o. male.   HPI 77 year old male presents with skin tear of right lower leg after fall that occurred 1.5 hours ago.  PMH significant for CHF, A-fib, COPD and chronic anticoagulation.  Past Medical History:  Diagnosis Date   Agent orange exposure 1970's   takes Imdur,Diltiazem, and Atacand daily   ALLERGIC RHINITIS    Allergy    seasonal allergies   Asthma    uses inhaler and nebulizers   Bruises easily    d/t prednisone daily   Cataract    CHF (congestive heart failure) (HCC)    dx-on meds to treat   Chronic bronchitis ([place])    "used to get it q yr; last time ?2008" (04/28/2012)   COPD (chronic obstructive pulmonary disease) (HCC)    agent orange exposure   Degenerative disk disease    "everywhere" (04/28/2012)   Diverticulitis    Dysrhythmia    afib   Elevated uric acid in blood    takes Allopurinol daily   Emphysema of lung ([place])    Enlarged prostate    but not on any meds   GERD (gastroesophageal reflux disease)    takes Protonix daily   H/O hiatal hernia    Headache    History of pneumonia    a. 2010   Hyperlipidemia    takes Lipitor daily; pt. states he takes a preventive   Hypothyroidism    Joint pain    Joint swelling    Neuromuscular disorder ([place])    bilat legs, and bilat arms   PAF (paroxysmal atrial fibrillation) ([place])    a. Dx 04/2012, CHA2DS2VASc = 1 (age);  b. 04/2012 Echo: EF 40-50%, mild MR.   Personal history of colonic adenomas 02/09/2013   Pneumonia    Shortness of breath dyspnea    Thyroid disease    no taking meds-caused dizziness    Patient Active Problem List   Diagnosis Date Noted   Heartburn    Atypical chest pain    Cervical radicular pain 06/11/2021   Chronic anticoagulation 03/15/2021   Peripheral neuropathy 02/22/2021   LV dysfunction 02/22/2021    Essential (primary) hypertension 11/15/2020   Arthritis of carpometacarpal [facility]) joint of right thumb 08/07/2020   Cellulitis 03/22/2020   COPD (chronic obstructive pulmonary disease) ([place]) 03/21/2020   Chest wall pain 09/21/2019   GERD (gastroesophageal reflux disease) 12/03/2017   Pneumothorax, left 04/11/2017   Lumbar stenosis with neurogenic claudication 04/07/2017   Primary osteoarthritis of right hip 07/31/2015   Primary osteoarthritis of right knee 06/17/2014   Thrush of mouth and esophagus ([place]) 04/03/2014   Palpitation 03/28/2014   Pseudoarthrosis of lumbar spine 06/15/2013   Hyperlipidemia    History of colonic polyps 02/09/2013   Atrial fibrillation ([place]) 05/11/2012   Osteoarthritis of left knee 01/10/2012   Seasonal and perennial allergic rhinitis 07/23/2010   Asthma, moderate persistent 03/06/2009    Past Surgical History:  Procedure Laterality Date   59 HOUR [place] STUDY N/A 05/30/2021   Procedure: 24 HOUR [place] STUDY;  Surgeon: Lavena Bullion, DO;  Location: [facility];  Service: Gastroenterology;  Laterality: N/A;   ANTERIOR CERVICAL DECOMP/DISCECTOMY FUSION N/A 07/21/2012   Procedure: ANTERIOR CERVICAL DECOMPRESSION/DISCECTOMY FUSION 2 LEVELS;  Surgeon: [name], MD;  Location: [facility];  Service:  Neurosurgery;  Laterality: N/A;  C4-5 C5-6 Anterior cervical decompression/diskectomy/fusion   ANTERIOR LAT LUMBAR FUSION N/A 04/07/2017   Procedure: Thoracic twelve-Lumbar one Anterolateral decompression/fusion;  Surgeon: [name], MD;  Location: [place];  Service: Neurosurgery;  Laterality: N/A;   BACK SURGERY     x 3   CARDIAC CATHETERIZATION  11/2008   [name] - 20% calcified non flow limiting left main, 50% EF apical hypokinesis   CERVICAL DISC ARTHROPLASTY N/A 04/07/2017   Procedure: Cervical six-seven Disc arthroplasty;  Surgeon: [name], MD;  Location: [place];  Service: Neurosurgery;  Laterality: N/A;   CHEST TUBE INSERTION Left 04/11/2017    Procedure: CHEST TUBE INSERTION;  Surgeon: [name], MD;  Location: [place];  Service: Thoracic;  Laterality: Left;   CHOLECYSTECTOMY     COLONOSCOPY  2016   CG-MAC-miralax(good)servere TICS/TA x 2   ESOPHAGEAL MANOMETRY N/A 05/30/2021   Procedure: ESOPHAGEAL MANOMETRY (EM);  Surgeon: Lavena Bullion, DO;  [facility]: [facility];  Service: Gastroenterology;  Laterality: N/A;  ph impedence   ESOPHAGOGASTRODUODENOSCOPY     EYE SURGERY Bilateral 2011   steroidal encapsulation" (04/28/2012)   FOOT SURGERY Right    x 2   KNEE ARTHROSCOPY Bilateral    LATERAL / POSTERIOR COMBINED FUSION LUMBAR SPINE  2012   LEFT ATRIAL APPENDAGE OCCLUSION N/A 02/22/2021   Procedure: LEFT ATRIAL APPENDAGE OCCLUSION;  Surgeon: Vickie Epley, MD;  Location: [place] [facility];  Service: Cardiovascular;  Laterality: N/A;   POLYPECTOMY  2016   severe TICS/TA x 2   POSTERIOR CERVICAL LAMINECTOMY WITH MET- RX Right 06/11/2021   Procedure: Right Cervical six-seven  Laminectomy and foraminotomy with metrex;  Surgeon: [name], MD;  Location: [place];  Service: Neurosurgery;  Laterality: Right;   REVERSE SHOULDER ARTHROPLASTY  04/28/2012   Procedure: REVERSE SHOULDER ARTHROPLASTY;  Surgeon: [name], MD;  Location: [place];  Service: Orthopedics;  Laterality: Left;  Left shouder reverse total shoulder arthroplasty   SHOULDER SURGERY     TEE WITHOUT CARDIOVERSION N/A 02/22/2021   Procedure: TRANSESOPHAGEAL ECHOCARDIOGRAM (TEE);  Surgeon: Vickie Epley, MD;  Location: [place] [facility];  Service: Cardiovascular;  Laterality: N/A;   TENDON REPAIR Right 08/07/2020   Procedure: RIGHT HAND LIGAMENT RECONSTRUCTION AND TENDON INTERPOSITION;  Surgeon: [name], MD;  Location: [place];  Service: Orthopedics;  Laterality: Right;   [place]   TOTAL HIP ARTHROPLASTY Left 2011   "left" (04/28/2012)   TOTAL HIP ARTHROPLASTY Right 07/31/2015   Procedure: TOTAL  HIP ARTHROPLASTY ANTERIOR APPROACH;  Surgeon: [name], MD;  Location: [place];  Service: Orthopedics;  Laterality: Right;   TOTAL KNEE ARTHROPLASTY  01/10/2012   Procedure: TOTAL KNEE ARTHROPLASTY;  Surgeon: [name], MD;  Location: [place];  Service: Orthopedics;;  left total knee arthroplasty   TOTAL KNEE ARTHROPLASTY Right 06/17/2014   Procedure: TOTAL KNEE ARTHROPLASTY;  Surgeon: [name], MD;  Location: [place];  Service: Orthopedics;  Laterality: Right;       Home Medications    Prior to Admission medications   Medication Sig Start Date End Date Taking? Authorizing Provider  amoxicillin-clavulanate (AUGMENTIN) 875-125 MG tablet Take 1 tablet by mouth 2 (two) times daily for 10 days. 07/20/21 07/30/21 Yes Eliezer Lofts, FNP  albuterol (VENTOLIN HFA) 108 (90 Base) MCG/ACT inhaler INHALE 2 PUFFS INTO THE LUNGS EVERY 4 (FOUR) HOURS AS NEEDED FOR WHEEZING OR SHORTNESS OF BREATH. 10/13/20   Baird Lyons  D, MD  allopurinol (ZYLOPRIM) 100 MG tablet Take 100 mg by mouth daily.    [provider]  atorvastatin (LIPITOR) 20 MG tablet TAKE 1 TABLET BY MOUTH EVERY DAY 04/05/21   Jerline Pain, MD  candesartan (ATACAND) 4 MG tablet TAKE 1/2 TABLETS (2 MG TOTAL) BY MOUTH DAILY. 02/26/21   Jerline Pain, MD  cyclobenzaprine (FLEXERIL) 10 MG tablet Take 1 tablet (10 mg total) by mouth 3 (three) times daily as needed for muscle spasms. 06/11/21   [name], MD  diclofenac Sodium (VOLTAREN) 1 % GEL Apply 2 g topically daily as needed (Pain).    [provider]  diltiazem (CARDIZEM CD) 180 MG 24 hr capsule TAKE 1 CAPSULE BY MOUTH EVERY DAY 01/30/21   Jerline Pain, MD  diltiazem (CARDIZEM) 30 MG tablet Take 30 mg by mouth as needed (heart rate over 120).    [provider]  DULoxetine (CYMBALTA) 60 MG capsule Take 1 capsule (60 mg total) by mouth daily. 05/09/21   Lomax, Amy, NP  ELIQUIS 5 MG TABS tablet TAKE 1 TABLET BY MOUTH TWICE A DAY 06/18/21   Jerline Pain, MD   furosemide (LASIX) 20 MG tablet Take 1 tablet (20 mg total) by mouth daily as needed. For shortness of breath and lower limb edema 11/17/18   Jerline Pain, MD  ipratropium-albuterol (DUONEB) 0.5-2.5 (3) MG/3ML SOLN 1 NEBULE EVERY 6 HOURS AS NEEDED. **J45.3** 03/22/20   Young, Tarri Fuller D, MD  isosorbide mononitrate (IMDUR) 30 MG 24 hr tablet TAKE 1 TABLET (30 MG TOTAL) BY MOUTH DAILY. 02/07/21   Jerline Pain, MD  Nebulizers (COMPRESSOR/NEBULIZER) MISC Use as directed 12/01/17   Deneise Lever, MD  omeprazole (PRILOSEC) 40 MG capsule Take 1 capsule (40 mg total) by mouth daily. 02/12/21   Cirigliano, Vito V, DO  oxyCODONE-acetaminophen (PERCOCET/ROXICET) 5-325 MG tablet Take 1 tablet by mouth 2 (two) times daily as needed for severe pain. 07/07/21   Eliezer Lofts, FNP  predniSONE (DELTASONE) 10 MG tablet TAKE 2 TABLETS (20 MG TOTAL) BY MOUTH DAILY. CONTINUOUS 04/20/21   Deneise Lever, MD  Sod Picosulfate-Mag Ox-Cit [facility]) 10-3.5-12 MG-GM -GM/160ML SOLN Take 1 kit by mouth as directed. 06/18/21   Cirigliano, Vito V, DO  STRIVERDI RESPIMAT 2.5 MCG/ACT AERS INHALE 2 PUFFS BY MOUTH INTO THE LUNGS DAILY 02/25/21   Baird Lyons D, MD  tamsulosin (FLOMAX) 0.4 MG CAPS capsule Take 0.4 mg by mouth at bedtime.  10/13/15   [provider]    Family History Family History  Problem Relation Age of Onset   Heart disease Father        Died 74, MI   Diabetes Mother    Prostate cancer Brother    Colon polyps Brother    Colon polyps Brother    Colon cancer Other    Esophageal cancer Neg Hx    Rectal cancer Neg Hx    Stomach cancer Neg Hx    Pancreatic cancer Neg Hx    Kidney disease Neg Hx    Liver disease Neg Hx     Social History Social History   Tobacco Use   Smoking status: Never   Smokeless tobacco: Never  Vaping Use   Vaping Use: Never used  Substance Use Topics   Alcohol use: No    Alcohol/week: 0.0 standard drinks   Drug use: No     Allergies   Morphine and  related and Levaquin [levofloxacin]   Review of  Systems Review of Systems  Skin:  Positive for wound.  All other systems reviewed and are negative.   Physical Exam Triage Vital Signs ED Triage Vitals  Enc Vitals Group     BP      Pulse      Resp      Temp      Temp src      SpO2      Weight      Height      Head Circumference      Peak Flow      Pain Score      Pain Loc      Pain Edu?      Excl. in [place]?    No data found.  Updated Vital Signs BP 134/75 (BP Location: Right Arm)    Pulse 84    Temp (!) 97.5 F (36.4 C) (Oral)    Resp 14    SpO2 96%    Physical Exam Vitals and nursing note reviewed.  Constitutional:      General: He is not in acute distress.    Appearance: Normal appearance. He is normal weight. He is not ill-appearing.  HENT:     Head: Normocephalic and atraumatic.     Mouth/Throat:     Mouth: Mucous membranes are moist.     Pharynx: Oropharynx is clear.  Eyes:     Extraocular Movements: Extraocular movements intact.     Conjunctiva/sclera: Conjunctivae normal.     Pupils: Pupils are equal, round, and reactive to light.  Cardiovascular:     Rate and Rhythm: Normal rate and regular rhythm.     Pulses: Normal pulses.     Heart sounds: Normal heart sounds.  Pulmonary:     Effort: Pulmonary effort is normal.     Breath sounds: Normal breath sounds.  Musculoskeletal:     Cervical back: Normal range of motion and neck supple.  Skin:    General: Skin is warm and dry.     Comments: Right lower leg (anterior aspect): 2 skin avulsions noted. 1st skin avulsion that occurred 7 days ago-7.5 cm x 0.5 cm linear skin avulsion-erythematous, indurated, no bleeding, discharge, drainage, or lymphatic streaking. 2nd skin avulsion just inferior to the first-5.5 cm x 0.5 cm linear skin avulsion-erythematous, indurated, no bleeding, discharge, drainage or lymphatic streaking.  Neurological:     General: No focal deficit present.     Mental Status: He is alert and  oriented to person, place, and time.     UC Treatments / Results  Labs (all labs ordered are listed, but only abnormal results are displayed) Labs Reviewed - No data to display  EKG   Radiology No results found.  Procedures Procedures (including critical care time)  Medications Ordered in UC Medications - No data to display  Initial Impression / Assessment and Plan / UC Course  I have reviewed the triage vital signs and the nursing notes.  Pertinent labs & imaging results that were available during my care of the patient were reviewed by me and considered in my medical decision making (see chart for details).     MDM: 1. Skin avulsion-Rx'd Augmentin. Advised patient to take medication as directed with food to completion.  Encouraged patient to increase daily water intake while taking this medication.  Advised patient to leave affected wound areas uncovered to allow healing by secondary intention.  Patient discharged home, hemodynamically stable. Final Clinical Impressions(s) / UC Diagnoses   Final diagnoses:  Skin  avulsion     Discharge Instructions      Advised patient to take medication as directed with food to completion.  Encouraged patient to increase daily water intake while taking this medication.  Advised patient to leave affected wound areas uncovered to allow healing by secondary intention.     ED Prescriptions     Medication Sig Dispense Auth. Provider   amoxicillin-clavulanate (AUGMENTIN) 875-125 MG tablet Take 1 tablet by mouth 2 (two) times daily for 10 days. 20 tablet Eliezer Lofts, FNP      PDMP not reviewed this encounter.   Eliezer Lofts, FNP 07/20/21 1440

## 2021-07-20 NOTE — Discharge Instructions (Addendum)
Advised patient to take medication as directed with food to completion.  Encouraged patient to increase daily water intake while taking this medication.  Advised patient to leave affected wound areas uncovered to allow healing by secondary intention.

## 2021-07-20 NOTE — ED Triage Notes (Signed)
Pt presents with a skin tear on his rt lower leg after a fall that occurred 1.5 hours ago.

## 2021-07-24 ENCOUNTER — Other Ambulatory Visit: Payer: Self-pay

## 2021-07-24 ENCOUNTER — Encounter: Payer: Self-pay | Admitting: Emergency Medicine

## 2021-07-24 ENCOUNTER — Emergency Department (INDEPENDENT_AMBULATORY_CARE_PROVIDER_SITE_OTHER): Payer: Medicare Other

## 2021-07-24 ENCOUNTER — Emergency Department (INDEPENDENT_AMBULATORY_CARE_PROVIDER_SITE_OTHER)
Admission: EM | Admit: 2021-07-24 | Discharge: 2021-07-24 | Disposition: A | Discharge status: Home / Self Care | Payer: Medicare Other | Source: Home / Self Care | Attending: Family Medicine | Admitting: Family Medicine

## 2021-07-24 DIAGNOSIS — W19XXXA Unspecified fall, initial encounter: Secondary | ICD-10-CM

## 2021-07-24 DIAGNOSIS — R0781 Pleurodynia: Secondary | ICD-10-CM

## 2021-07-24 DIAGNOSIS — W1809XA Striking against other object with subsequent fall, initial encounter: Secondary | ICD-10-CM | POA: Diagnosis not present

## 2021-07-24 DIAGNOSIS — S2242XA Multiple fractures of ribs, left side, initial encounter for closed fracture: Secondary | ICD-10-CM | POA: Diagnosis not present

## 2021-07-24 DIAGNOSIS — M25552 Pain in left hip: Secondary | ICD-10-CM

## 2021-07-24 DIAGNOSIS — J9811 Atelectasis: Secondary | ICD-10-CM | POA: Diagnosis not present

## 2021-07-24 DIAGNOSIS — T1490XA Injury, unspecified, initial encounter: Secondary | ICD-10-CM

## 2021-07-24 DIAGNOSIS — J90 Pleural effusion, not elsewhere classified: Secondary | ICD-10-CM | POA: Diagnosis not present

## 2021-07-24 MED ORDER — ACETAMINOPHEN 500 MG PO TABS
1000.0000 mg | ORAL_TABLET | Freq: Once | ORAL | Status: AC
  Administered 2021-07-24: 1000 mg via ORAL

## 2021-07-24 MED ORDER — OXYCODONE-ACETAMINOPHEN 5-325 MG PO TABS: 1.0000 | ORAL_TABLET | Freq: Two times a day (BID) | ORAL | 0 refills | Status: DC | PRN

## 2021-07-24 NOTE — Discharge Instructions (Signed)
Take Tylenol for moderate pain Take oxycodone for severe pain Do not drive on oxycodone The stronger pain medicines can cause constipation.  You may need a stool softener Take deep breaths frequently throughout the day Report any increase in cough, fever, or sputum production.  Concern for developing pneumonia Follow-up with your primary care doctor

## 2021-07-24 NOTE — ED Triage Notes (Deleted)
Pt states he broke a rib on right side in December. Yesterday pain returned and has been getting worse on right side. Denies known injury, cough or difficulty breathing.

## 2021-07-24 NOTE — ED Provider Notes (Signed)
[name]    CSN: [id number] Arrival date & time: 07/24/21  0801      History   Chief Complaint Chief Complaint  Patient presents with   Rib Injury    Pt woke up in the middle of the night and tripped over the footboard of his bed, c/o left sided rib and hip pain,     HPI Levi [name] is a 76 y.o. male.   HPI  Very pleasant 76 year old gentleman who is here with his wife for injury sustained in a fall.  Patient states that he got up last night and tripped over the foot of the bed landing on the floor.  He states that he "hit hard".  He landed on his left side.  He has pain in his left ribs and in his left hip.  His left hip has been replaced.  He has pain with a deep breath.  Pain with movement. Patient has a history of atrial fibrillation and heart disease.  He is on anticoagulation.  History of congestive heart failure, hypertension and hyperlipidemia treatment as well.  He does have underlying COPD and asthma.  He uses inhalers. Patient has had multiple orthopedic problems.  Cervical spine surgery, lumbar spine surgery, both hips replaced, both knees replaced, left shoulder replaced  Past Medical History:  Diagnosis Date   Agent orange exposure 1970's   takes Imdur,Diltiazem, and Atacand daily   ALLERGIC RHINITIS    Allergy    seasonal allergies   Asthma    uses inhaler and nebulizers   Bruises easily    d/t prednisone daily   Cataract    CHF (congestive heart failure) ([place])    dx-on meds to treat   Chronic bronchitis ([place])    "used to get it q yr; last time ?2008" (04/28/2012)   COPD (chronic obstructive pulmonary disease) ([facility])    agent orange exposure   Degenerative disk disease    "everywhere" (04/28/2012)   Diverticulitis    Dysrhythmia    afib   Elevated uric acid in blood    takes Allopurinol daily   Emphysema of lung ([place])    Enlarged prostate    but not on any meds   GERD (gastroesophageal reflux disease)    takes Protonix daily   H/O  hiatal hernia    Headache    History of pneumonia    a. 2010   Hyperlipidemia    takes Lipitor daily; pt. states he takes a preventive   Hypothyroidism    Joint pain    Joint swelling    Neuromuscular disorder ([place])    bilat legs, and bilat arms   PAF (paroxysmal atrial fibrillation) ([place])    a. Dx 04/2012, CHA2DS2VASc = 1 (age);  b. 04/2012 Echo: EF 40-50%, mild MR.   Personal history of colonic adenomas 02/09/2013   Pneumonia    Shortness of breath dyspnea    Thyroid disease    no taking meds-caused dizziness    Patient Active Problem List   Diagnosis Date Noted   Heartburn    Atypical chest pain    Cervical radicular pain 06/11/2021   Chronic anticoagulation 03/15/2021   Peripheral neuropathy 02/22/2021   LV dysfunction 02/22/2021   Essential (primary) hypertension 11/15/2020   Arthritis of carpometacarpal [facility]) joint of right thumb 08/07/2020   Cellulitis 03/22/2020   COPD (chronic obstructive pulmonary disease) ([place]) 03/21/2020   Chest wall pain 09/21/2019   GERD (gastroesophageal reflux disease) 12/03/2017   Pneumothorax, left 04/11/2017  Lumbar stenosis with neurogenic claudication 04/07/2017   Primary osteoarthritis of right hip 07/31/2015   Primary osteoarthritis of right knee 06/17/2014   Thrush of mouth and esophagus ([place]) 04/03/2014   Palpitation 03/28/2014   Pseudoarthrosis of lumbar spine 06/15/2013   Hyperlipidemia    History of colonic polyps 02/09/2013   Atrial fibrillation ([place]) 05/11/2012   Osteoarthritis of left knee 01/10/2012   Seasonal and perennial allergic rhinitis 07/23/2010   Asthma, moderate persistent 03/06/2009    Past Surgical History:  Procedure Laterality Date   21 HOUR [place] STUDY N/A 05/30/2021   Procedure: 24 HOUR [place] STUDY;  Surgeon: Lavena Bullion, DO;  Location: [facility];  Service: Gastroenterology;  Laterality: N/A;   ANTERIOR CERVICAL DECOMP/DISCECTOMY FUSION N/A 07/21/2012   Procedure: ANTERIOR CERVICAL  DECOMPRESSION/DISCECTOMY FUSION 2 LEVELS;  Surgeon: [name], MD;  Location: [facility];  Service: Neurosurgery;  Laterality: N/A;  C4-5 C5-6 Anterior cervical decompression/diskectomy/fusion   ANTERIOR LAT LUMBAR FUSION N/A 04/07/2017   Procedure: Thoracic twelve-Lumbar one Anterolateral decompression/fusion;  Surgeon: [name], MD;  Location: [facility];  Service: Neurosurgery;  Laterality: N/A;   BACK SURGERY     x 3   CARDIAC CATHETERIZATION  11/2008   [name] - 20% calcified non flow limiting left main, 50% EF apical hypokinesis   CERVICAL DISC ARTHROPLASTY N/A 04/07/2017   Procedure: Cervical six-seven Disc arthroplasty;  Surgeon: [name], MD;  Location: [facility];  Service: Neurosurgery;  Laterality: N/A;   CHEST TUBE INSERTION Left 04/11/2017   Procedure: CHEST TUBE INSERTION;  Surgeon: [name], MD;  Location: [facility] [facility];  Service: Thoracic;  Laterality: Left;   CHOLECYSTECTOMY     COLONOSCOPY  2016   CG-MAC-miralax(good)servere TICS/TA x 2   ESOPHAGEAL MANOMETRY N/A 05/30/2021   Procedure: ESOPHAGEAL MANOMETRY (EM);  Surgeon: Lavena Bullion, DO;  [facility];  Service: Gastroenterology;  Laterality: N/A;  ph impedence   ESOPHAGOGASTRODUODENOSCOPY     EYE SURGERY Bilateral 2011   steroidal encapsulation" (04/28/2012)   FOOT SURGERY Right    x 2   KNEE ARTHROSCOPY Bilateral    LATERAL / POSTERIOR COMBINED FUSION LUMBAR SPINE  2012   LEFT ATRIAL APPENDAGE OCCLUSION N/A 02/22/2021   Procedure: LEFT ATRIAL APPENDAGE OCCLUSION;  Surgeon: Vickie Epley, MD;  Location: [place] [facility];  Service: Cardiovascular;  Laterality: N/A;   POLYPECTOMY  2016   severe TICS/TA x 2   POSTERIOR CERVICAL LAMINECTOMY WITH MET- RX Right 06/11/2021   Procedure: Right Cervical six-seven  Laminectomy and foraminotomy with metrex;  Surgeon: [name], MD;  Location: [facility] [place];  Service: Neurosurgery;  Laterality: Right;   REVERSE SHOULDER ARTHROPLASTY  04/28/2012   Procedure:  REVERSE SHOULDER ARTHROPLASTY;  Surgeon: [name], MD;  Location: [place];  Service: Orthopedics;  Laterality: Left;  Left shouder reverse total shoulder arthroplasty   SHOULDER SURGERY     TEE WITHOUT CARDIOVERSION N/A 02/22/2021   Procedure: TRANSESOPHAGEAL ECHOCARDIOGRAM (TEE);  Surgeon: Vickie Epley, MD;  Location: [place] [facility];  Service: Cardiovascular;  Laterality: N/A;   TENDON REPAIR Right 08/07/2020   Procedure: RIGHT HAND LIGAMENT RECONSTRUCTION AND TENDON INTERPOSITION;  Surgeon: [name], MD;  Location: [place];  Service: Orthopedics;  Laterality: Right;   [place]   TOTAL HIP ARTHROPLASTY Left 2011   "left" (04/28/2012)   TOTAL HIP ARTHROPLASTY Right 07/31/2015   Procedure: TOTAL HIP ARTHROPLASTY ANTERIOR APPROACH;  Surgeon: [name], MD;  Location:  [place] [facility];  Service: Orthopedics;  Laterality: Right;   TOTAL KNEE ARTHROPLASTY  01/10/2012   Procedure: TOTAL KNEE ARTHROPLASTY;  Surgeon: [name], MD;  Location: [place];  Service: Orthopedics;;  left total knee arthroplasty   TOTAL KNEE ARTHROPLASTY Right 06/17/2014   Procedure: TOTAL KNEE ARTHROPLASTY;  Surgeon: [name], MD;  Location: [facility];  Service: Orthopedics;  Laterality: Right;       Home Medications    Prior to Admission medications   Medication Sig Start Date End Date Taking? Authorizing Provider  albuterol (VENTOLIN HFA) 108 (90 Base) MCG/ACT inhaler INHALE 2 PUFFS INTO THE LUNGS EVERY 4 (FOUR) HOURS AS NEEDED FOR WHEEZING OR SHORTNESS OF BREATH. 10/13/20   Baird Lyons D, MD  allopurinol (ZYLOPRIM) 100 MG tablet Take 100 mg by mouth daily.    [provider]  amoxicillin-clavulanate (AUGMENTIN) 875-125 MG tablet Take 1 tablet by mouth 2 (two) times daily for 10 days. 07/20/21 07/30/21  Eliezer Lofts, FNP  atorvastatin (LIPITOR) 20 MG tablet TAKE 1 TABLET BY MOUTH EVERY DAY 04/05/21   Jerline Pain, MD  candesartan (ATACAND) 4 MG  tablet TAKE 1/2 TABLETS (2 MG TOTAL) BY MOUTH DAILY. 02/26/21   Jerline Pain, MD  cyclobenzaprine (FLEXERIL) 10 MG tablet Take 1 tablet (10 mg total) by mouth 3 (three) times daily as needed for muscle spasms. 06/11/21   [name], MD  diclofenac Sodium (VOLTAREN) 1 % GEL Apply 2 g topically daily as needed (Pain).    [provider]  diltiazem (CARDIZEM CD) 180 MG 24 hr capsule TAKE 1 CAPSULE BY MOUTH EVERY DAY 01/30/21   Jerline Pain, MD  diltiazem (CARDIZEM) 30 MG tablet Take 30 mg by mouth as needed (heart rate over 120).    [provider]  DULoxetine (CYMBALTA) 60 MG capsule Take 1 capsule (60 mg total) by mouth daily. 05/09/21   Lomax, Amy, NP  ELIQUIS 5 MG TABS tablet TAKE 1 TABLET BY MOUTH TWICE A DAY 06/18/21   Jerline Pain, MD  furosemide (LASIX) 20 MG tablet Take 1 tablet (20 mg total) by mouth daily as needed. For shortness of breath and lower limb edema 11/17/18   Jerline Pain, MD  ipratropium-albuterol (DUONEB) 0.5-2.5 (3) MG/3ML SOLN 1 NEBULE EVERY 6 HOURS AS NEEDED. **J45.3** 03/22/20   Young, Tarri Fuller D, MD  isosorbide mononitrate (IMDUR) 30 MG 24 hr tablet TAKE 1 TABLET (30 MG TOTAL) BY MOUTH DAILY. 02/07/21   Jerline Pain, MD  Nebulizers (COMPRESSOR/NEBULIZER) MISC Use as directed 12/01/17   Deneise Lever, MD  omeprazole (PRILOSEC) 40 MG capsule Take 1 capsule (40 mg total) by mouth daily. 02/12/21   Cirigliano, Vito V, DO  oxyCODONE-acetaminophen (PERCOCET/ROXICET) 5-325 MG tablet Take 1-2 tablets by mouth 2 (two) times daily as needed for severe pain. 07/24/21   Raylene Everts, MD  predniSONE (DELTASONE) 10 MG tablet TAKE 2 TABLETS (20 MG TOTAL) BY MOUTH DAILY. CONTINUOUS 04/20/21   Deneise Lever, MD  Sod Picosulfate-Mag Ox-Cit Acd [facility]) 10-3.5-12 MG-GM -GM/160ML SOLN Take 1 kit by mouth as directed. 06/18/21   Cirigliano, Vito V, DO  STRIVERDI RESPIMAT 2.5 MCG/ACT AERS INHALE 2 PUFFS BY MOUTH INTO THE LUNGS DAILY 02/25/21   Baird Lyons D, MD   tamsulosin (FLOMAX) 0.4 MG CAPS capsule Take 0.4 mg by mouth at bedtime.  10/13/15   [provider]    Family History Family History  Problem Relation Age of Onset   Heart  disease Father        Died 74, MI   Diabetes Mother    Prostate cancer Brother    Colon polyps Brother    Colon polyps Brother    Colon cancer Other    Esophageal cancer Neg Hx    Rectal cancer Neg Hx    Stomach cancer Neg Hx    Pancreatic cancer Neg Hx    Kidney disease Neg Hx    Liver disease Neg Hx     Social History Social History   Tobacco Use   Smoking status: Never   Smokeless tobacco: Never  Vaping Use   Vaping Use: Never used  Substance Use Topics   Alcohol use: No    Alcohol/week: 0.0 standard drinks   Drug use: No     Allergies   Morphine and related and Levaquin [levofloxacin]   Review of Systems Review of Systems See HPI  Physical Exam Triage Vital Signs ED Triage Vitals  Enc Vitals Group     BP 07/24/21 0814 118/78     Pulse Rate 07/24/21 0814 68     Resp 07/24/21 0814 18     Temp 07/24/21 0814 97.8 F (36.6 C)     Temp Source 07/24/21 0814 Oral     SpO2 07/24/21 0814 100 %     Weight --      Height --      Head Circumference --      Peak Flow --      Pain Score 07/24/21 0816 9     Pain Loc --      Pain Edu? --      Excl. in [place]? --    No data found.  Updated Vital Signs BP 118/78 (BP Location: Right Arm)    Pulse 68    Temp 97.8 F (36.6 C) (Oral)    Resp 18    SpO2 100%   Physical Exam Constitutional:      General: He is in acute distress.     Appearance: He is well-developed and normal weight.     Comments: In wheelchair.  Guarded movements.  Shallow breathing.  Obviously uncomfortable  HENT:     Head: Normocephalic and atraumatic.     Mouth/Throat:     Comments: Mask is in place Eyes:     Conjunctiva/sclera: Conjunctivae normal.     Pupils: Pupils are equal, round, and reactive to light.  Cardiovascular:     Rate and Rhythm: Normal rate  and regular rhythm.     Heart sounds: Normal heart sounds.  Pulmonary:     Effort: Pulmonary effort is normal. No respiratory distress.     Breath sounds: Normal breath sounds.     Comments: Somewhat shallow breathing.  No abnormal lung sounds.  Right lower ribs quite tender. Abdominal:     General: There is no distension.     Palpations: Abdomen is soft.  Musculoskeletal:        General: Normal range of motion.     Cervical back: Normal range of motion.     Comments: Limited movement left hip.  Tender over greater trochanter  Skin:    General: Skin is warm and dry.  Neurological:     Mental Status: He is alert.     Gait: Gait abnormal.  Psychiatric:        Mood and Affect: Mood normal.        Behavior: Behavior normal.     UC Treatments / Results  Labs (  all labs ordered are listed, but only abnormal results are displayed) Labs Reviewed - No data to display  EKG   Radiology DG Ribs Unilateral W/Chest Left  Result Date: 07/24/2021 CLINICAL DATA:  Left rib pain. EXAM: LEFT RIBS AND CHEST - 3+ VIEW COMPARISON:  July 07, 2021. FINDINGS: Minimally displaced acute fractures are seen involving the lateral portions of the left eighth and ninth ribs. Left basilar atelectasis is noted with probable small left pleural effusion. No definite pneumothorax is noted. Heart size and mediastinal contours are within normal limits. IMPRESSION: Minimally displaced left eighth and ninth rib fractures. Mild left basilar atelectasis and small effusion is noted. Electronically Signed   By: Marijo Conception M.D.   On: 07/24/2021 09:07   DG HIP UNILAT WITH PELVIS 2-3 VIEWS LEFT  Result Date: 07/24/2021 CLINICAL DATA:  Left hip pain after fall today. EXAM: DG HIP (WITH OR WITHOUT PELVIS) 2-3V LEFT COMPARISON:  None. FINDINGS: Status post bilateral total hip arthroplasties. No acute fracture or dislocation is noted. Heterotopic bone formation is noted around the left hip. IMPRESSION: Status post  bilateral total hip arthroplasties. No acute abnormality is noted. Electronically Signed   By: Marijo Conception M.D.   On: 07/24/2021 09:09    Procedures Procedures (including critical care time)  Medications Ordered in UC Medications  acetaminophen (TYLENOL) tablet 1,000 mg (1,000 mg Oral Given 07/24/21 0844)    Initial Impression / Assessment and Plan / UC Course  I have reviewed the triage vital signs and the nursing notes.  Pertinent labs & imaging results that were available during my care of the patient were reviewed by me and considered in my medical decision making (see chart for details).     Rib fractures discussed.  Patient has an incentive spirometer at home and is instructed to use it at least 3 times a day.  Prevention of pneumonia is reviewed.  Pros and cons of rib belt reviewed. Final Clinical Impressions(s) / UC Diagnoses   Final diagnoses:  Acute hip pain, left  Closed fracture of multiple ribs of left side, initial encounter  Fall, initial encounter     Discharge Instructions      Take Tylenol for moderate pain Take oxycodone for severe pain Do not drive on oxycodone The stronger pain medicines can cause constipation.  You may need a stool softener Take deep breaths frequently throughout the day Report any increase in cough, fever, or sputum production.  Concern for developing pneumonia Follow-up with your primary care doctor   ED Prescriptions     Medication Sig Dispense Auth. Provider   oxyCODONE-acetaminophen (PERCOCET/ROXICET) 5-325 MG tablet Take 1-2 tablets by mouth 2 (two) times daily as needed for severe pain. 30 tablet Raylene Everts, MD      I have reviewed the PDMP during this encounter.   Raylene Everts, MD 07/24/21 1018

## 2021-07-27 ENCOUNTER — Other Ambulatory Visit: Payer: Self-pay

## 2021-07-27 ENCOUNTER — Emergency Department (INDEPENDENT_AMBULATORY_CARE_PROVIDER_SITE_OTHER)
Admission: EM | Admit: 2021-07-27 | Discharge: 2021-07-27 | Disposition: A | Discharge status: Home / Self Care | Payer: Medicare Other | Source: Home / Self Care | Attending: Family Medicine | Admitting: Family Medicine

## 2021-07-27 DIAGNOSIS — L97912 Non-pressure chronic ulcer of unspecified part of right lower leg with fat layer exposed: Secondary | ICD-10-CM | POA: Diagnosis not present

## 2021-07-27 DIAGNOSIS — R609 Edema, unspecified: Secondary | ICD-10-CM

## 2021-07-27 DIAGNOSIS — L03115 Cellulitis of right lower limb: Secondary | ICD-10-CM

## 2021-07-27 MED ORDER — DOXYCYCLINE HYCLATE 100 MG PO CAPS: ORAL_CAPSULE | ORAL | 0 refills | Status: DC

## 2021-07-27 NOTE — ED Triage Notes (Addendum)
Pt here c/o bilateral leg swelling since Tuesday. Was seen here for a fall and broken ribs. Also has a cut that is wheeping on ET shin area. Takes furosemide as needed.  ?

## 2021-07-27 NOTE — ED Provider Notes (Signed)
[facility]i [name] CARE    CSN: [id number] Arrival date & time: 07/27/21  1705      History   Chief Complaint Chief Complaint  Patient presents with   Leg Swelling    HPI Levi [name] is a 76 y.o. male.   Patient complains of increased swelling in both lower legs during the past 3 days.  He reports that he has dependent edema and has been advised to wear support hose, although he generally does not.  He also has Lasix 108m on hand but only occasionally takes it. He was evaluated here one week ago for two skin tears on his right lower anterior leg, and started on Augmentin.  An upper tear had occurred two weeks ago, while the lower one had occurred on the day of his visit.  He was again evaluated here 3 days ago after a fall in which he suffered minimally displaced fractures of his left 8th and 9th ribs.  He denies shortness of breath secondary to his rib fractures, but admits that he is still quite sore from his fall.  He denies fevers, chills, and sweats. His primary concern today is the lack of healing of the two wounds on his right lower leg.  In addition to the persistent edema in his lower legs, he has developed clear yellow weeping from the wounds.  He has been applying daily bandages and continues to take Augmentin.  He denies calf pain.  The history is provided by the patient and the spouse.   Past Medical History:  Diagnosis Date   Agent orange exposure 1970's   takes Imdur,Diltiazem, and Atacand daily   ALLERGIC RHINITIS    Allergy    seasonal allergies   Asthma    uses inhaler and nebulizers   Bruises easily    d/t prednisone daily   Cataract    CHF (congestive heart failure) (HCC)    dx-on meds to treat   Chronic bronchitis ([facility]    "used to get it q yr; last time ?2008" (04/28/2012)   COPD (chronic obstructive pulmonary disease) (HCC)    agent orange exposure   Degenerative disk disease    "everywhere" (04/28/2012)   Diverticulitis    Dysrhythmia    afib    Elevated uric acid in blood    takes Allopurinol daily   Emphysema of lung ([facility]    Enlarged prostate    but not on any meds   GERD (gastroesophageal reflux disease)    takes Protonix daily   H/O hiatal hernia    Headache    History of pneumonia    a. 2010   Hyperlipidemia    takes Lipitor daily; pt. states he takes a preventive   Hypothyroidism    Joint pain    Joint swelling    Neuromuscular disorder ([place]    bilat legs, and bilat arms   PAF (paroxysmal atrial fibrillation) ([facility]    a. Dx 04/2012, CHA2DS2VASc = 1 (age);  b. 04/2012 Echo: EF 40-50%, mild MR.   Personal history of colonic adenomas 02/09/2013   Pneumonia    Shortness of breath dyspnea    Thyroid disease    no taking meds-caused dizziness    Patient Active Problem List   Diagnosis Date Noted   Heartburn    Atypical chest pain    Cervical radicular pain 06/11/2021   Chronic anticoagulation 03/15/2021   Peripheral neuropathy 02/22/2021   LV dysfunction 02/22/2021   Essential (primary) hypertension 11/15/2020   Arthritis of  carpometacarpal [facility]) joint of right thumb 08/07/2020   Cellulitis 03/22/2020   COPD (chronic obstructive pulmonary disease) ([place]) 03/21/2020   Chest wall pain 09/21/2019   GERD (gastroesophageal reflux disease) 12/03/2017   Pneumothorax, left 04/11/2017   Lumbar stenosis with neurogenic claudication 04/07/2017   Primary osteoarthritis of right hip 07/31/2015   Primary osteoarthritis of right knee 06/17/2014   Thrush of mouth and esophagus ([place]) 04/03/2014   Palpitation 03/28/2014   Pseudoarthrosis of lumbar spine 06/15/2013   Hyperlipidemia    History of colonic polyps 02/09/2013   Atrial fibrillation ([place]) 05/11/2012   Osteoarthritis of left knee 01/10/2012   Seasonal and perennial allergic rhinitis 07/23/2010   Asthma, moderate persistent 03/06/2009    Past Surgical History:  Procedure Laterality Date   51 HOUR [place] STUDY N/A 05/30/2021   Procedure: 24 HOUR [place] STUDY;  Surgeon:  Lavena Bullion, DO;  Location: [facility];  Service: Gastroenterology;  Laterality: N/A;   ANTERIOR CERVICAL DECOMP/DISCECTOMY FUSION N/A 07/21/2012   Procedure: ANTERIOR CERVICAL DECOMPRESSION/DISCECTOMY FUSION 2 LEVELS;  Surgeon: [name], MD;  Location: [place] [facility];  Service: Neurosurgery;  Laterality: N/A;  C4-5 C5-6 Anterior cervical decompression/diskectomy/fusion   ANTERIOR LAT LUMBAR FUSION N/A 04/07/2017   Procedure: Thoracic twelve-Lumbar one Anterolateral decompression/fusion;  Surgeon: [name], MD;  Location: [place];  Service: Neurosurgery;  Laterality: N/A;   BACK SURGERY     x 3   CARDIAC CATHETERIZATION  11/2008   [name] - 20% calcified non flow limiting left main, 50% EF apical hypokinesis   CERVICAL DISC ARTHROPLASTY N/A 04/07/2017   Procedure: Cervical six-seven Disc arthroplasty;  Surgeon: [name], MD;  Location: [place];  Service: Neurosurgery;  Laterality: N/A;   CHEST TUBE INSERTION Left 04/11/2017   Procedure: CHEST TUBE INSERTION;  Surgeon: [name], MD;  Location: [place];  Service: Thoracic;  Laterality: Left;   CHOLECYSTECTOMY     COLONOSCOPY  2016   CG-MAC-miralax(good)servere TICS/TA x 2   ESOPHAGEAL MANOMETRY N/A 05/30/2021   Procedure: ESOPHAGEAL MANOMETRY (EM);  Surgeon: Lavena Bullion, DO;  [facility]: [facility];  Service: Gastroenterology;  Laterality: N/A;  ph impedence   ESOPHAGOGASTRODUODENOSCOPY     EYE SURGERY Bilateral 2011   steroidal encapsulation" (04/28/2012)   FOOT SURGERY Right    x 2   KNEE ARTHROSCOPY Bilateral    LATERAL / POSTERIOR COMBINED FUSION LUMBAR SPINE  2012   LEFT ATRIAL APPENDAGE OCCLUSION N/A 02/22/2021   Procedure: LEFT ATRIAL APPENDAGE OCCLUSION;  Surgeon: Vickie Epley, MD;  Location: [place] [facility];  Service: Cardiovascular;  Laterality: N/A;   POLYPECTOMY  2016   severe TICS/TA x 2   POSTERIOR CERVICAL LAMINECTOMY WITH MET- RX Right 06/11/2021   Procedure: Right Cervical six-seven   Laminectomy and foraminotomy with metrex;  Surgeon: [name], MD;  Location: [place];  Service: Neurosurgery;  Laterality: Right;   REVERSE SHOULDER ARTHROPLASTY  04/28/2012   Procedure: REVERSE SHOULDER ARTHROPLASTY;  Surgeon: [name], MD;  Location: [place];  Service: Orthopedics;  Laterality: Left;  Left shouder reverse total shoulder arthroplasty   SHOULDER SURGERY     TEE WITHOUT CARDIOVERSION N/A 02/22/2021   Procedure: TRANSESOPHAGEAL ECHOCARDIOGRAM (TEE);  Surgeon: Vickie Epley, MD;  Location: [place] CV [facility];  Service: Cardiovascular;  Laterality: N/A;   TENDON REPAIR Right 08/07/2020   Procedure: RIGHT HAND LIGAMENT RECONSTRUCTION AND TENDON INTERPOSITION;  Surgeon: [name], MD;  Location: [place];  Service: Orthopedics;  Laterality: Right;  [place]   TOTAL HIP ARTHROPLASTY Left 2011   "left" (04/28/2012)   TOTAL HIP ARTHROPLASTY Right 07/31/2015   Procedure: TOTAL HIP ARTHROPLASTY ANTERIOR APPROACH;  Surgeon: [name], MD;  Location: [place];  Service: Orthopedics;  Laterality: Right;   TOTAL KNEE ARTHROPLASTY  01/10/2012   Procedure: TOTAL KNEE ARTHROPLASTY;  Surgeon: [name], MD;  Location: [place];  Service: Orthopedics;;  left total knee arthroplasty   TOTAL KNEE ARTHROPLASTY Right 06/17/2014   Procedure: TOTAL KNEE ARTHROPLASTY;  Surgeon: [name], MD;  Location: [place];  Service: Orthopedics;  Laterality: Right;       Home Medications    Prior to Admission medications   Medication Sig Start Date End Date Taking? Authorizing Provider  doxycycline (VIBRAMYCIN) 100 MG capsule Take one cap PO Q12hr with food. 07/27/21  Yes Kandra Nicolas, MD  albuterol (VENTOLIN HFA) 108 (90 Base) MCG/ACT inhaler INHALE 2 PUFFS INTO THE LUNGS EVERY 4 (FOUR) HOURS AS NEEDED FOR WHEEZING OR SHORTNESS OF BREATH. 10/13/20   Baird Lyons D, MD  allopurinol (ZYLOPRIM) 100 MG tablet Take 100 mg by mouth daily.     [provider]  atorvastatin (LIPITOR) 20 MG tablet TAKE 1 TABLET BY MOUTH EVERY DAY 04/05/21   Jerline Pain, MD  candesartan (ATACAND) 4 MG tablet TAKE 1/2 TABLETS (2 MG TOTAL) BY MOUTH DAILY. 02/26/21   Jerline Pain, MD  cyclobenzaprine (FLEXERIL) 10 MG tablet Take 1 tablet (10 mg total) by mouth 3 (three) times daily as needed for muscle spasms. 06/11/21   [name], MD  diclofenac Sodium (VOLTAREN) 1 % GEL Apply 2 g topically daily as needed (Pain).    [provider]  diltiazem (CARDIZEM CD) 180 MG 24 hr capsule TAKE 1 CAPSULE BY MOUTH EVERY DAY 01/30/21   Jerline Pain, MD  diltiazem (CARDIZEM) 30 MG tablet Take 30 mg by mouth as needed (heart rate over 120).    [provider]  DULoxetine (CYMBALTA) 60 MG capsule Take 1 capsule (60 mg total) by mouth daily. 05/09/21   [place], Amy, NP  ELIQUIS 5 MG TABS tablet TAKE 1 TABLET BY MOUTH TWICE A DAY 06/18/21   Jerline Pain, MD  furosemide (LASIX) 20 MG tablet Take 1 tablet (20 mg total) by mouth daily as needed. For shortness of breath and lower limb edema 11/17/18   Jerline Pain, MD  ipratropium-albuterol (DUONEB) 0.5-2.5 (3) MG/3ML SOLN 1 NEBULE EVERY 6 HOURS AS NEEDED. **J45.3** 03/22/20   Young, Tarri Fuller D, MD  isosorbide mononitrate (IMDUR) 30 MG 24 hr tablet TAKE 1 TABLET (30 MG TOTAL) BY MOUTH DAILY. 02/07/21   Jerline Pain, MD  Nebulizers (COMPRESSOR/NEBULIZER) MISC Use as directed 12/01/17   Deneise Lever, MD  omeprazole (PRILOSEC) 40 MG capsule Take 1 capsule (40 mg total) by mouth daily. 02/12/21   Cirigliano, Vito V, DO  oxyCODONE-acetaminophen (PERCOCET/ROXICET) 5-325 MG tablet Take 1-2 tablets by mouth 2 (two) times daily as needed for severe pain. 07/24/21   Raylene Everts, MD  predniSONE (DELTASONE) 10 MG tablet TAKE 2 TABLETS (20 MG TOTAL) BY MOUTH DAILY. CONTINUOUS 04/20/21   Deneise Lever, MD  Sod Picosulfate-Mag Ox-Cit [facility]) 10-3.5-12 MG-GM -GM/160ML SOLN Take 1 kit by mouth as  directed. 06/18/21   Cirigliano, Vito V, DO  STRIVERDI RESPIMAT 2.5 MCG/ACT AERS INHALE 2 PUFFS BY MOUTH INTO THE LUNGS DAILY 02/25/21   Deneise Lever, MD  tamsulosin [facility])  0.4 MG CAPS capsule Take 0.4 mg by mouth at bedtime.  10/13/15   [provider]    Family History Family History  Problem Relation Age of Onset   Heart disease Father        Died 74, MI   Diabetes Mother    Prostate cancer Brother    Colon polyps Brother    Colon polyps Brother    Colon cancer Other    Esophageal cancer Neg Hx    Rectal cancer Neg Hx    Stomach cancer Neg Hx    Pancreatic cancer Neg Hx    Kidney disease Neg Hx    Liver disease Neg Hx     Social History Social History   Tobacco Use   Smoking status: Never   Smokeless tobacco: Never  Vaping Use   Vaping Use: Never used  Substance Use Topics   Alcohol use: No    Alcohol/week: 0.0 standard drinks   Drug use: No     Allergies   Morphine and related and Levaquin [levofloxacin]   Review of Systems Review of Systems  Constitutional:  Positive for activity change. Negative for chills, diaphoresis and fever.  Respiratory:  Positive for chest tightness. Negative for shortness of breath, wheezing and stridor.   Cardiovascular:  Positive for leg swelling.  Skin:  Positive for color change and wound.  All other systems reviewed and are negative.   Physical Exam Triage Vital Signs ED Triage Vitals  Enc Vitals Group     BP 07/27/21 1805 (!) 158/86     Pulse Rate 07/27/21 1805 83     Resp 07/27/21 1805 18     Temp 07/27/21 1805 98.2 F (36.8 C)     Temp Source 07/27/21 1805 Oral     SpO2 07/27/21 1805 98 %     Weight --      Height --      Head Circumference --      Peak Flow --      Pain Score 07/27/21 1807 0     Pain Loc --      Pain Edu? --      Excl. in [place]? --    No data found.  Updated Vital Signs BP (!) 158/86 (BP Location: Right Arm)    Pulse 83    Temp 98.2 F (36.8 C) (Oral)    Resp 18    SpO2 98%    Visual Acuity Right Eye Distance:   Left Eye Distance:   Bilateral Distance:    Right Eye Near:   Left Eye Near:    Bilateral Near:     Physical Exam Constitutional:      General: He is not in acute distress.    Comments: Patient moves slowly and deliberately as a result of multiple joint replacements.  HENT:     Head: Atraumatic.     Mouth/Throat:     Mouth: Mucous membranes are moist.  Eyes:     Pupils: Pupils are equal, round, and reactive to light.  Cardiovascular:     Rate and Rhythm: Normal rate and regular rhythm.  Pulmonary:     Breath sounds: Normal breath sounds.  Chest:     Chest wall: Tenderness present.  Abdominal:     Tenderness: There is no abdominal tenderness.  Musculoskeletal:     Right lower leg: No tenderness. 4+ Pitting Edema present.     Left lower leg: No tenderness. 4+ Pitting Edema present.  Legs:     Comments: Right lower leg pre-tibial area has two weeping linear ulcers present: An upper wound is about 7.5cm by 1cm, with surrounding erythema and mild tenderness to palpation. An inferior wound that is about 5.5cm by 1cm, with surrounding erythema and mild tenderness to palpation. Both wounds are weeping clear yellow fluid. There is no calf tenderness to palpation bilaterally.  Skin:    General: Skin is warm and dry.  Neurological:     Mental Status: He is alert and oriented to person, place, and time.     UC Treatments / Results  Labs (all labs ordered are listed, but only abnormal results are displayed) Labs Reviewed  WOUND CULTURE    EKG   Radiology No results found.  Procedures Procedures (including critical care time)  Medications Ordered in UC Medications - No data to display  Initial Impression / Assessment and Plan / UC Course  I have reviewed the triage vital signs and the nursing notes.  Pertinent labs & imaging results that were available during my care of the patient were reviewed by me and considered in  my medical decision making (see chart for details).    Wound culture pending from two wounds right lower anterior leg.  Wounds cleansed and non-stick bandages applied. Applied ace wraps on right lower leg in graduated compression fashion. Begin doxycycline for staph coverage. Recommend follow-up with PCP within one week, earlier if symptoms worsen. Patient would benefit from referral to wound care clinic for evaluation/treatment of persistent wounds right lower leg.  Final Clinical Impressions(s) / UC Diagnoses   Final diagnoses:  Skin ulcer of right lower leg with fat layer exposed ([place])  Cellulitis of right anterior lower leg  Dependent edema     Discharge Instructions      Discontinue Augmentin and begin doxycycline.  Take a probiotic (such as yogurt) while taking doxycycline. Change dressing on wounds right lower leg daily.  Keep wounds clean and dry.  Begin wearing support hose on both lower legs daily. Take a lasix tablet each morning for about four days.  Recommend evaluation by a wound care clinic (discuss with your family doctor).       ED Prescriptions     Medication Sig Dispense Auth. Provider   doxycycline (VIBRAMYCIN) 100 MG capsule Take one cap PO Q12hr with food. 14 capsule Kandra Nicolas, MD         Kandra Nicolas, MD 07/29/21 [phone]

## 2021-07-27 NOTE — Discharge Instructions (Addendum)
Discontinue Augmentin and begin doxycycline.  Take a probiotic (such as yogurt) while taking doxycycline. ?Change dressing on wounds right lower leg daily.  Keep wounds clean and dry.  Begin wearing support hose on both lower legs daily. ?Take a lasix tablet each morning for about four days. ? ?Recommend evaluation by a wound care clinic (discuss with your family doctor). ?  ? ?

## 2021-07-28 DIAGNOSIS — L03115 Cellulitis of right lower limb: Secondary | ICD-10-CM | POA: Diagnosis not present

## 2021-07-28 DIAGNOSIS — L97912 Non-pressure chronic ulcer of unspecified part of right lower leg with fat layer exposed: Secondary | ICD-10-CM | POA: Diagnosis not present

## 2021-07-30 NOTE — Progress Notes (Signed)
Surgery orders requested via Epic inbox. °

## 2021-07-31 ENCOUNTER — Ambulatory Visit (HOSPITAL_COMMUNITY)
Admission: RE | Admit: 2021-07-31 | Discharge: 2021-07-31 | Disposition: A | Discharge status: Home / Self Care | Payer: Medicare Other | Source: Ambulatory Visit | Attending: Orthopedic Surgery | Admitting: Orthopedic Surgery

## 2021-07-31 ENCOUNTER — Other Ambulatory Visit: Payer: Self-pay

## 2021-07-31 ENCOUNTER — Encounter: Payer: Medicare Other | Admitting: Gastroenterology

## 2021-07-31 ENCOUNTER — Other Ambulatory Visit (HOSPITAL_COMMUNITY): Payer: Self-pay | Admitting: Orthopedic Surgery

## 2021-07-31 DIAGNOSIS — R600 Localized edema: Secondary | ICD-10-CM | POA: Diagnosis not present

## 2021-07-31 DIAGNOSIS — S81811A Laceration without foreign body, right lower leg, initial encounter: Secondary | ICD-10-CM | POA: Diagnosis not present

## 2021-07-31 DIAGNOSIS — S2231XA Fracture of one rib, right side, initial encounter for closed fracture: Secondary | ICD-10-CM | POA: Diagnosis not present

## 2021-07-31 DIAGNOSIS — M7989 Other specified soft tissue disorders: Secondary | ICD-10-CM | POA: Diagnosis not present

## 2021-07-31 DIAGNOSIS — M79669 Pain in unspecified lower leg: Secondary | ICD-10-CM | POA: Insufficient documentation

## 2021-08-01 DIAGNOSIS — I5022 Chronic systolic (congestive) heart failure: Secondary | ICD-10-CM | POA: Diagnosis not present

## 2021-08-01 DIAGNOSIS — I110 Hypertensive heart disease with heart failure: Secondary | ICD-10-CM | POA: Diagnosis not present

## 2021-08-01 DIAGNOSIS — R600 Localized edema: Secondary | ICD-10-CM | POA: Diagnosis not present

## 2021-08-01 DIAGNOSIS — E785 Hyperlipidemia, unspecified: Secondary | ICD-10-CM | POA: Diagnosis not present

## 2021-08-01 DIAGNOSIS — I87331 Chronic venous hypertension (idiopathic) with ulcer and inflammation of right lower extremity: Secondary | ICD-10-CM | POA: Diagnosis not present

## 2021-08-07 ENCOUNTER — Ambulatory Visit
Admission: RE | Admit: 2021-08-07 | Discharge: 2021-08-07 | Disposition: A | Discharge status: Home / Self Care | Payer: Medicare Other | Source: Ambulatory Visit | Attending: General Surgery | Admitting: General Surgery

## 2021-08-07 ENCOUNTER — Encounter (HOSPITAL_COMMUNITY): Payer: Medicare Other

## 2021-08-07 ENCOUNTER — Ambulatory Visit: Payer: Medicare Other | Admitting: Neurology

## 2021-08-07 ENCOUNTER — Other Ambulatory Visit: Payer: Self-pay | Admitting: General Surgery

## 2021-08-07 DIAGNOSIS — K224 Dyskinesia of esophagus: Secondary | ICD-10-CM | POA: Diagnosis not present

## 2021-08-07 DIAGNOSIS — K219 Gastro-esophageal reflux disease without esophagitis: Secondary | ICD-10-CM | POA: Diagnosis not present

## 2021-08-08 ENCOUNTER — Ambulatory Visit (INDEPENDENT_AMBULATORY_CARE_PROVIDER_SITE_OTHER): Payer: Medicare Other | Admitting: Cardiology

## 2021-08-08 ENCOUNTER — Other Ambulatory Visit: Payer: Self-pay

## 2021-08-08 ENCOUNTER — Encounter: Payer: Self-pay | Admitting: Cardiology

## 2021-08-08 ENCOUNTER — Telehealth: Payer: Self-pay | Admitting: Gastroenterology

## 2021-08-08 VITALS — BP 130/74 | HR 85 | Ht 74.0 in | Wt 190.0 lb

## 2021-08-08 DIAGNOSIS — I5022 Chronic systolic (congestive) heart failure: Secondary | ICD-10-CM

## 2021-08-08 DIAGNOSIS — R600 Localized edema: Secondary | ICD-10-CM | POA: Diagnosis not present

## 2021-08-08 DIAGNOSIS — I2510 Atherosclerotic heart disease of native coronary artery without angina pectoris: Secondary | ICD-10-CM

## 2021-08-08 DIAGNOSIS — Z79899 Other long term (current) drug therapy: Secondary | ICD-10-CM

## 2021-08-08 DIAGNOSIS — I480 Paroxysmal atrial fibrillation: Secondary | ICD-10-CM

## 2021-08-08 MED ORDER — FUROSEMIDE 40 MG PO TABS: 40.0000 mg | ORAL_TABLET | Freq: Every day | ORAL | 3 refills | Status: DC

## 2021-08-08 NOTE — Assessment & Plan Note (Addendum)
Currently taking Lasix 20 mg as needed.  We will increase his Lasix to 40 mg twice a day for 3 days and then 40 mg once a day thereafter.  Take potassium supplement 20 mill equivalents with dose of Lasix.  He was recently given the potassium supplement by Dr. Keane Police office.  His potassium was 3.3.  Starting creatinine was approximately 1.  Also asked him to increase protein intake, ensure for instance.  He has a wound on his right pretibial region challenging to heal especially with the edema.  He is seeing wound care soon.  He was recently placed on doxycycline.  His recent immobility with fall/rib fractures etc. have exacerbated edema.  His EF is 45 to 50%, only mildly reduced. ?-In approximately 1 week we will have him come back in, basic metabolic profile, see how his legs are doing.  Closely monitor renal function as well as potassium. ?

## 2021-08-08 NOTE — Patient Instructions (Signed)
Medication Instructions:  ?Please take Furosemide 40 mg twice a day for 3 days then take 40 mg once a day. ?Take Potassium Chloride twice a day for 3 days then return to once a day. ?Continue all other medications as listed. ? ?*If you need a refill on your cardiac medications before your next appointment, please call your pharmacy* ? ?Lab Work: ?Please have blood work in 1 week (BMP) ? ?If you have labs (blood work) drawn today and your tests are completely normal, you will receive your results only by: ?MyChart Message (if you have MyChart) OR ?A paper copy in the mail ?If you have any lab test that is abnormal or we need to change your treatment, we will call you to review the results. ? ?Follow-Up: ?At [facility], you and your health needs are our priority.  As part of our continuing mission to provide you with exceptional heart care, we have created designated Provider Care Teams.  These Care Teams include your primary Cardiologist (physician) and Advanced Practice Providers (APPs -  Physician Assistants and Nurse Practitioners) who all work together to provide you with the care you need, when you need it. ? ?We recommend signing up for the patient portal called "MyChart".  Sign up information is provided on this After Visit Summary.  MyChart is used to connect with patients for Virtual Visits (Telemedicine).  Patients are able to view lab/test results, encounter notes, upcoming appointments, etc.  Non-urgent messages can be sent to your provider as well.   ?To learn more about what you can do with MyChart, go to [email or web address].   ? ?Your next appointment:   ?1 week(s) ? ?The format for your next appointment:   ?In Person ? ?Provider:   ?Robbie Lis, PA-C, Nicholes Rough, PA-C, Melina Copa, PA-C, Ambrose Pancoast, NP, Cecilie Kicks, NP, Ermalinda Barrios, PA-C, Christen Bame, NP, or Richardson Dopp, PA-C       ? ? ?Thank you for choosing [facility]!! ? ? ? ?

## 2021-08-08 NOTE — Telephone Encounter (Signed)
Understood. Thanks for the update.  

## 2021-08-08 NOTE — Progress Notes (Signed)
?Cardiology Office Note:   ? ?Date:  08/08/2021  ? ?ID:  Levi [name], DOB [birth year], MRN [id number] ? ?PCP:  Shon Baton, MD ?  ?[facility] Providers ?Cardiologist:  Candee Furbish, MD ?Electrophysiologist:  Vickie Epley, MD    ? ?Referring MD: Reginold Agent, NP  ? ?History of Present Illness:   ? ?Levi [name] is a 76 y.o. male here for paroxysmal atrial fibrillation follow-up, post watchman device attempt. Watchman device was attempted however interatrial septum was highly redundant and transseptal puncture was too close to the outside wall of the left atrium to be safely performed. Therefore watchman attempt was aborted. ? ?He presented to the ED 07/20/21 with a skin tear on his right lower leg caused by a fall earlier that day. He had an additional skin tear on the same leg that occurred a week prior after a fall. As there was no active bleeding, he was given Augmentin and discharged in stable condition.  ? ?He presented to the ED again 07/24/21 after a fall occurring that evening. He tripped over the foot of his bed and hit his L-side against the floor. He reports pain on his L-side especially with deep breathing and movement. He was found to have minimally displaced L 8th and 9th rib fractures with mild L basilar atelectasis and small effusion. He was discharged with oxycodone and advised to follow-up with his primary care.  ? ?On 07/27/21, he presented to the ED complaining of bilateral LE swelling over the past 3 days. He reported he was only occasionally taking Lasix. He was also concerned about the slow healing of the 2 wounds on his R leg that began to develop clear yellow discharge. There was no evidence of DVT. He was applying daily bandages and continuing Augmentin. Augmentin was switched to doxycycline.  ? ?Today, he is accompanied by his wife and is doing well from a CV standpoint. He fell 2 months ago and fractured 2 ribs. He fell again 2 weeks ago against his bed and fractured 2 different  ribs. He reports his neuropathy will cause him to lose feeling in his legs and lead to falls. He walks with a cane.  ? ?He has 2 closed lesions on his R leg and edema of both legs.The lesions are surrounded by warm, red skin. Typically, he keeps his leg wrapped in bandages. He feels the fluid retention all the way up to his thighs. His wife has noticed swelling in his bilateral arms recently. He does have compression socks and uses them occasionally. The swelling is noticeable but improved in the morning. He has been taking 20 mg Lasix daily.  ? ?In the past few weeks, he has noticed infrequent episodes of Afib.  ? ?He denies any chest pain, or shortness of breath, lightheadedness, headaches, syncope, orthopnea, or PND. ? ?Past Medical History:  ?Diagnosis Date  ? Agent orange exposure 1970's  ? takes Imdur,Diltiazem, and Atacand daily  ? ALLERGIC RHINITIS   ? Allergy   ? seasonal allergies  ? Asthma   ? uses inhaler and nebulizers  ? Bruises easily   ? d/t prednisone daily  ? Cataract   ? CHF (congestive heart failure) ([place])   ? dx-on meds to treat  ? Chronic bronchitis ([place])   ? "used to get it q yr; last time ?2008" (04/28/2012)  ? COPD (chronic obstructive pulmonary disease) ([place])   ? agent orange exposure  ? Degenerative disk disease   ? "everywhere" (04/28/2012)  ?  Diverticulitis   ? Dysrhythmia   ? afib  ? Elevated uric acid in blood   ? takes Allopurinol daily  ? Emphysema of lung ([place])   ? Enlarged prostate   ? but not on any meds  ? GERD (gastroesophageal reflux disease)   ? takes Protonix daily  ? H/O hiatal hernia   ? Headache   ? History of pneumonia   ? a. 2010  ? Hyperlipidemia   ? takes Lipitor daily; pt. states he takes a preventive  ? Hypothyroidism   ? Joint pain   ? Joint swelling   ? Neuromuscular disorder ([place])   ? bilat legs, and bilat arms  ? PAF (paroxysmal atrial fibrillation) ([place])   ? a. Dx 04/2012, CHA2DS2VASc = 1 (age);  b. 04/2012 Echo: EF 40-50%, mild MR.  ? Personal history of colonic  adenomas 02/09/2013  ? Pneumonia   ? Shortness of breath dyspnea   ? Thyroid disease   ? no taking meds-caused dizziness  ? ? ?Past Surgical History:  ?Procedure Laterality Date  ? 85 HOUR [place] STUDY N/A 05/30/2021  ? Procedure: [place] STUDY;  Surgeon: Lavena Bullion, DO;  [facility];  Service: Gastroenterology;  Laterality: N/A;  ? ANTERIOR CERVICAL DECOMP/DISCECTOMY FUSION N/A 07/21/2012  ? Procedure: ANTERIOR CERVICAL DECOMPRESSION/DISCECTOMY FUSION 2 LEVELS;  Surgeon: [name], MD;  Location: [facility];  Service: Neurosurgery;  Laterality: N/A;  C4-5 C5-6 Anterior cervical decompression/diskectomy/fusion  ? ANTERIOR LAT LUMBAR FUSION N/A 04/07/2017  ? Procedure: Thoracic twelve-Lumbar one Anterolateral decompression/fusion;  Surgeon: [name], MD;  Location: [place];  Service: Neurosurgery;  Laterality: N/A;  ? BACK SURGERY    ? x 3  ? CARDIAC CATHETERIZATION  11/2008  ? [name] - 20% calcified non flow limiting left main, 50% EF apical hypokinesis  ? CERVICAL DISC ARTHROPLASTY N/A 04/07/2017  ? Procedure: Cervical six-seven Disc arthroplasty;  Surgeon: [name], MD;  Location: [facility];  Service: Neurosurgery;  Laterality: N/A;  ? CHEST TUBE INSERTION Left 04/11/2017  ? Procedure: CHEST TUBE INSERTION;  Surgeon: [name], MD;  Location: [place] [facility];  Service: Thoracic;  Laterality: Left;  ? CHOLECYSTECTOMY    ? COLONOSCOPY  2016  ? CG-MAC-miralax(good)servere TICS/TA x 2  ? ESOPHAGEAL MANOMETRY N/A 05/30/2021  ? Procedure: ESOPHAGEAL MANOMETRY (EM);  Surgeon: Lavena Bullion, DO;  [facility];  Service: Gastroenterology;  Laterality: N/A;  ph impedence  ? ESOPHAGOGASTRODUODENOSCOPY    ? EYE SURGERY Bilateral 2011  ? steroidal encapsulation" (04/28/2012)  ? FOOT SURGERY Right   ? x 2  ? KNEE ARTHROSCOPY Bilateral   ? LATERAL / POSTERIOR COMBINED FUSION LUMBAR SPINE  2012  ? LEFT ATRIAL APPENDAGE OCCLUSION N/A 02/22/2021  ? Procedure: LEFT ATRIAL APPENDAGE OCCLUSION;   Surgeon: Vickie Epley, MD;  Location: [facility];  Service: Cardiovascular;  Laterality: N/A;  ? POLYPECTOMY  2016  ? severe TICS/TA x 2  ? POSTERIOR CERVICAL LAMINECTOMY WITH MET- RX Right 06/11/2021  ? Procedure: Right Cervical six-seven  Laminectomy and foraminotomy with metrex;  Surgeon: [name], MD;  Location: [facility];  Service: Neurosurgery;  Laterality: Right;  ? REVERSE SHOULDER ARTHROPLASTY  04/28/2012  ? Procedure: REVERSE SHOULDER ARTHROPLASTY;  Surgeon: [name], MD;  Location: [facility];  Service: Orthopedics;  Laterality: Left;  Left shouder reverse total shoulder arthroplasty  ? SHOULDER SURGERY    ? TEE WITHOUT CARDIOVERSION N/A 02/22/2021  ? Procedure: TRANSESOPHAGEAL ECHOCARDIOGRAM (TEE);  Surgeon: Vickie Epley, MD;  Location: [facility];  Service: Cardiovascular;  Laterality: N/A;  ? TENDON REPAIR Right 08/07/2020  ? Procedure: RIGHT HAND LIGAMENT RECONSTRUCTION AND TENDON INTERPOSITION;  Surgeon: [name], MD;  Location: [facility];  Service: Orthopedics;  Laterality: Right;  ? [place]  ? TOTAL HIP ARTHROPLASTY Left 2011  ? "left" (04/28/2012)  ? TOTAL HIP ARTHROPLASTY Right 07/31/2015  ? Procedure: TOTAL HIP ARTHROPLASTY ANTERIOR APPROACH;  Surgeon: [name], MD;  Location: [facility];  Service: Orthopedics;  Laterality: Right;  ? TOTAL KNEE ARTHROPLASTY  01/10/2012  ? Procedure: TOTAL KNEE ARTHROPLASTY;  Surgeon: [name], MD;  Location: [facility];  Service: Orthopedics;;  left total knee arthroplasty  ? TOTAL KNEE ARTHROPLASTY Right 06/17/2014  ? Procedure: TOTAL KNEE ARTHROPLASTY;  Surgeon: [name], MD;  Location: [facility];  Service: Orthopedics;  Laterality: Right;  ? ? ?Current Medications: ?Current Meds  ?Medication Sig  ? albuterol (VENTOLIN HFA) 108 (90 Base) MCG/ACT inhaler INHALE 2 PUFFS INTO THE LUNGS EVERY 4 (FOUR) HOURS AS NEEDED FOR WHEEZING OR SHORTNESS OF BREATH.  ? allopurinol (ZYLOPRIM) 100 MG  tablet Take 100 mg by mouth daily.  ? atorvastatin (LIPITOR) 20 MG tablet TAKE 1 TABLET BY MOUTH EVERY DAY  ? candesartan (ATACAND) 4 MG tablet TAKE 1/2 TABLETS (2 MG TOTAL) BY MOUTH DAILY.  ? cyclobenzap

## 2021-08-08 NOTE — Telephone Encounter (Signed)
Hey Dr. Bryan Lemma,  ? ?Patient wife called in to cancel procedure 3/20. States he have many infection in his legs that are taking longer than he would like to heal. Patient will call back to reschedule.  ? ?Thank you ?

## 2021-08-08 NOTE — Assessment & Plan Note (Signed)
Continue with Eliquis and diltiazem at current dosing.  Well-controlled.  He is unable to obtain Watchman device because of highly redundant interatrial septum.  Attempt was made. ?

## 2021-08-13 ENCOUNTER — Encounter: Payer: Medicare Other | Admitting: Gastroenterology

## 2021-08-14 NOTE — Progress Notes (Signed)
?Cardiology Office Note:   ? ?Date:  08/15/2021  ? ?ID:  Levi [name], DOB Sep 16, 1945, MRN [id number] ? ?PCP:  Shon Baton, MD ?  ?[facility] Providers ?Cardiologist:  Candee Furbish, MD ?Electrophysiologist:  Vickie Epley, MD    ? ?Referring MD: Shon Baton, MD  ? ?Chief Complaint: one week follow-up leg edema ? ?History of Present Illness:   ? ?Levi [name] is a 76 y.o. male with a hx of atrial fibrillation, HTN, hyperlipidemia, COPD, agent orange exposure, lumbar fusion, and neuropathy. ? ?He had cardiac catheterization in 2010 showed no flow-limiting CAD. He developed atrial fibrillation in 2013 which was a brief episodes and reoccurred in 2016. He has maintained regular follow-up.  ? ?He was scheduled for left atrial appendage occlusion (Watchman) 02/22/21. Watchman device was attempted however interatrial septum was highly redundant and transseptal puncture was too close to the outside wall of the left atrium to be safely performed. Therefore watchman attempt was aborted.  ? ?He was last seen by [name] on 08/08/21 for PAF. He reported infrequent episodes of atrial fibrillation, however he was having significant lower extremity edema. LVEF only mildly reduced 45-50% by echo 01/2021. He was advised to increase furosemide 40 mg twice daily then 40 mg daily up from 20 mg daily and return in 1 week for follow-up. ? ?Today, he is here alone for follow-up. He reports occasional lightheadedness, especially with movement and he is careful to move slowly. States his BP is labile and he has had periods of hypotension for many years. He reports recent falls have been mechanical due to neuropathy and he denies dizziness, presyncope, or syncope. He uses a cane for walking. He reports significant improvement in bilateral leg swelling since last office visit on 3/15.  He reports good urine output on current dose of Lasix 40 mg a day.  He is using a wedge to elevate his legs appropriately when sitting.  He has  compression hose but does not wear them consistently. He denies chest pain, shortness of breath, fatigue, palpitations, melena, hematuria, hemoptysis, diaphoresis, weakness, presyncope, syncope, orthopnea, and PND. ? ? ?Past Medical History:  ?Diagnosis Date  ? Agent orange exposure 1970's  ? takes Imdur,Diltiazem, and Atacand daily  ? ALLERGIC RHINITIS   ? Allergy   ? seasonal allergies  ? Asthma   ? uses inhaler and nebulizers  ? Bruises easily   ? d/t prednisone daily  ? Cataract   ? CHF (congestive heart failure) ([place])   ? dx-on meds to treat  ? Chronic bronchitis ([place])   ? "used to get it q yr; last time ?2008" (04/28/2012)  ? COPD (chronic obstructive pulmonary disease) ([place])   ? agent orange exposure  ? Degenerative disk disease   ? "everywhere" (04/28/2012)  ? Diverticulitis   ? Dysrhythmia   ? afib  ? Elevated uric acid in blood   ? takes Allopurinol daily  ? Emphysema of lung ([place])   ? Enlarged prostate   ? but not on any meds  ? GERD (gastroesophageal reflux disease)   ? takes Protonix daily  ? H/O hiatal hernia   ? Headache   ? History of pneumonia   ? a. 2010  ? Hyperlipidemia   ? takes Lipitor daily; pt. states he takes a preventive  ? Hypothyroidism   ? Joint pain   ? Joint swelling   ? Neuromuscular disorder ([place])   ? bilat legs, and bilat arms  ? PAF (paroxysmal atrial fibrillation) (  [place])   ? a. Dx 04/2012, CHA2DS2VASc = 1 (age);  b. 04/2012 Echo: EF 40-50%, mild MR.  ? Personal history of colonic adenomas 02/09/2013  ? Pneumonia   ? Shortness of breath dyspnea   ? Thyroid disease   ? no taking meds-caused dizziness  ? ? ?Past Surgical History:  ?Procedure Laterality Date  ? 43 HOUR [place] STUDY N/A 05/30/2021  ? Procedure: [place] STUDY;  Surgeon: Lavena Bullion, DO;  [facility];  Service: Gastroenterology;  Laterality: N/A;  ? ANTERIOR CERVICAL DECOMP/DISCECTOMY FUSION N/A 07/21/2012  ? Procedure: ANTERIOR CERVICAL DECOMPRESSION/DISCECTOMY FUSION 2 LEVELS;  Surgeon: [name], MD;   Location: [facility];  Service: Neurosurgery;  Laterality: N/A;  C4-5 C5-6 Anterior cervical decompression/diskectomy/fusion  ? ANTERIOR LAT LUMBAR FUSION N/A 04/07/2017  ? Procedure: Thoracic twelve-Lumbar one Anterolateral decompression/fusion;  Surgeon: [name], MD;  Location: [facility];  Service: Neurosurgery;  Laterality: N/A;  ? BACK SURGERY    ? x 3  ? CARDIAC CATHETERIZATION  11/2008  ? [name] - 20% calcified non flow limiting left main, 50% EF apical hypokinesis  ? CERVICAL DISC ARTHROPLASTY N/A 04/07/2017  ? Procedure: Cervical six-seven Disc arthroplasty;  Surgeon: [name], MD;  Location: [facility];  Service: Neurosurgery;  Laterality: N/A;  ? CHEST TUBE INSERTION Left 04/11/2017  ? Procedure: CHEST TUBE INSERTION;  Surgeon: [name], MD;  Location: [facility];  Service: Thoracic;  Laterality: Left;  ? CHOLECYSTECTOMY    ? COLONOSCOPY  2016  ? CG-MAC-miralax(good)servere TICS/TA x 2  ? ESOPHAGEAL MANOMETRY N/A 05/30/2021  ? Procedure: ESOPHAGEAL MANOMETRY (EM);  Surgeon: Lavena Bullion, DO;  [facility];  Service: Gastroenterology;  Laterality: N/A;  ph impedence  ? ESOPHAGOGASTRODUODENOSCOPY    ? EYE SURGERY Bilateral 2011  ? steroidal encapsulation" (04/28/2012)  ? FOOT SURGERY Right   ? x 2  ? KNEE ARTHROSCOPY Bilateral   ? LATERAL / POSTERIOR COMBINED FUSION LUMBAR SPINE  2012  ? LEFT ATRIAL APPENDAGE OCCLUSION N/A 02/22/2021  ? Procedure: LEFT ATRIAL APPENDAGE OCCLUSION;  Surgeon: Vickie Epley, MD;  Location: [facility];  Service: Cardiovascular;  Laterality: N/A;  ? POLYPECTOMY  2016  ? severe TICS/TA x 2  ? POSTERIOR CERVICAL LAMINECTOMY WITH MET- RX Right 06/11/2021  ? Procedure: Right Cervical six-seven  Laminectomy and foraminotomy with metrex;  Surgeon: [name], MD;  Location: [facility];  Service: Neurosurgery;  Laterality: Right;  ? REVERSE SHOULDER ARTHROPLASTY  04/28/2012  ? Procedure: REVERSE SHOULDER ARTHROPLASTY;  Surgeon: [name], MD;   Location: [facility];  Service: Orthopedics;  Laterality: Left;  Left shouder reverse total shoulder arthroplasty  ? SHOULDER SURGERY    ? TEE WITHOUT CARDIOVERSION N/A 02/22/2021  ? Procedure: TRANSESOPHAGEAL ECHOCARDIOGRAM (TEE);  Surgeon: Vickie Epley, MD;  Location: [facility];  Service: Cardiovascular;  Laterality: N/A;  ? TENDON REPAIR Right 08/07/2020  ? Procedure: RIGHT HAND LIGAMENT RECONSTRUCTION AND TENDON INTERPOSITION;  Surgeon: [name], MD;  Location: [place];  Service: Orthopedics;  Laterality: Right;  ? [place]  ? TOTAL HIP ARTHROPLASTY Left 2011  ? "left" (04/28/2012)  ? TOTAL HIP ARTHROPLASTY Right 07/31/2015  ? Procedure: TOTAL HIP ARTHROPLASTY ANTERIOR APPROACH;  Surgeon: [name], MD;  Location: [facility];  Service: Orthopedics;  Laterality: Right;  ? TOTAL KNEE ARTHROPLASTY  01/10/2012  ? Procedure: TOTAL KNEE ARTHROPLASTY;  Surgeon: [name], MD;  Location: [facility];  Service: Orthopedics;;  left total knee arthroplasty  ? TOTAL KNEE ARTHROPLASTY Right 06/17/2014  ? Procedure: TOTAL KNEE ARTHROPLASTY;  Surgeon: [name], MD;  Location: [facility];  Service: Orthopedics;  Laterality: Right;  ? ? ?Current Medications: ?Current Meds  ?Medication Sig  ? albuterol (VENTOLIN HFA) 108 (90 Base) MCG/ACT inhaler INHALE 2 PUFFS INTO THE LUNGS EVERY 4 (FOUR) HOURS AS NEEDED FOR WHEEZING OR SHORTNESS OF BREATH.  ? allopurinol (ZYLOPRIM) 100 MG tablet Take 100 mg by mouth daily.  ? atorvastatin (LIPITOR) 20 MG tablet TAKE 1 TABLET BY MOUTH EVERY DAY  ? candesartan (ATACAND) 4 MG tablet TAKE 1/2 TABLETS (2 MG TOTAL) BY MOUTH DAILY.  ? cyclobenzaprine (FLEXERIL) 10 MG tablet Take 1 tablet (10 mg total) by mouth 3 (three) times daily as needed for muscle spasms.  ? diclofenac Sodium (VOLTAREN) 1 % GEL Apply 2 g topically daily as needed (Pain).  ? diltiazem (CARDIZEM CD) 180 MG 24 hr capsule TAKE 1 CAPSULE BY MOUTH EVERY DAY  ? diltiazem (CARDIZEM) 30 MG  tablet Take 30 mg by mouth as needed (heart rate over 120).  ? DULoxetine (CYMBALTA) 60 MG capsule Take 1 capsule (60 mg total) by mouth daily.  ? ELIQUIS 5 MG TABS tablet TAKE 1 TABLET BY MOUTH TWICE A DAY  ?

## 2021-08-15 ENCOUNTER — Other Ambulatory Visit: Payer: Self-pay

## 2021-08-15 ENCOUNTER — Other Ambulatory Visit: Payer: Medicare Other | Admitting: *Deleted

## 2021-08-15 ENCOUNTER — Ambulatory Visit (INDEPENDENT_AMBULATORY_CARE_PROVIDER_SITE_OTHER): Payer: Medicare Other | Admitting: Nurse Practitioner

## 2021-08-15 ENCOUNTER — Encounter: Payer: Self-pay | Admitting: Nurse Practitioner

## 2021-08-15 VITALS — BP 94/62 | HR 84 | Ht 74.0 in | Wt 186.8 lb

## 2021-08-15 DIAGNOSIS — R600 Localized edema: Secondary | ICD-10-CM

## 2021-08-15 DIAGNOSIS — I10 Essential (primary) hypertension: Secondary | ICD-10-CM

## 2021-08-15 DIAGNOSIS — I5022 Chronic systolic (congestive) heart failure: Secondary | ICD-10-CM | POA: Diagnosis not present

## 2021-08-15 DIAGNOSIS — Z7901 Long term (current) use of anticoagulants: Secondary | ICD-10-CM | POA: Diagnosis not present

## 2021-08-15 DIAGNOSIS — Z79899 Other long term (current) drug therapy: Secondary | ICD-10-CM | POA: Diagnosis not present

## 2021-08-15 DIAGNOSIS — I480 Paroxysmal atrial fibrillation: Secondary | ICD-10-CM

## 2021-08-15 DIAGNOSIS — R296 Repeated falls: Secondary | ICD-10-CM

## 2021-08-15 NOTE — Progress Notes (Addendum)
Levi [name], Levi [name] ([id number]) ?Visit Report for 08/16/2021 ?Allergy List Details ?Patient Name: Date of Service: ?Levi Jan HN A. 08/16/2021 9:00 A M ?Medical Record Number: [id number] ?Patient Account Number: [id number] ?Date of Birth/Sex: Treating RN: ?April 11, 1946 (76 y.o. M) ?Primary Care Miryah Ralls: Shon Baton Other Clinician: ?Referring Dimetrius Montfort: ?Treating Shatika Grinnell/Extender: Fredirick Maudlin ?Shon Baton ?Weeks in Treatment: 0 ?Allergies ?Active Allergies ?morphine ?Levaquin ?Allergy Notes ?Electronic Signature(s) ?Signed: 08/16/2021 5:41:11 PM By: Baruch Gouty RN, BSN ?Previous Signature: 08/15/2021 2:20:54 PM Version By: Valeria Batman EMT ?Entered By: Baruch Gouty on 08/16/2021 09:03:06 ?-------------------------------------------------------------------------------- ?Arrival Information Details ?Patient Name: Date of Service: ?Levi Jan HN A. 08/16/2021 9:00 A M ?Medical Record Number: [id number] ?Patient Account Number: [id number] ?Date of Birth/Sex: Treating RN: ?1945/12/05 (76 y.o. [name] ?Primary Care Nicholos Aloisi: Shon Baton Other Clinician: ?Referring Lecretia Buczek: ?Treating Yerik Zeringue/Extender: Fredirick Maudlin ?Shon Baton ?Weeks in Treatment: 0 ?Visit Information ?Patient Arrived: Ambulatory ?Arrival Time: 08:58 ?Accompanied By: spouse ?Transfer Assistance: None ?Patient Identification Verified: Yes ?Secondary Verification Process Completed: Yes ?Patient Requires Transmission-Based Precautions: No ?Patient Has Alerts: Yes ?Patient Alerts: Patient on Blood Thinner ?R ABI N/C ?Electronic Signature(s) ?Signed: 08/16/2021 5:41:11 PM By: Baruch Gouty RN, BSN ?Entered By: Baruch Gouty on 08/16/2021 09:42:38 ?-------------------------------------------------------------------------------- ?Clinic Level of Care Assessment Details ?Patient Name: Date of Service: ?Levi Jan HN A. 08/16/2021 9:00 A M ?Medical Record Number: [id number] ?Patient Account Number: [id number] ?Date of Birth/Sex: Treating  RN: ?1946-05-08 (77 y.o. [name] ?Primary Care Loana Salvaggio: Shon Baton Other Clinician: ?Referring Wadell Craddock: ?Treating Breckan Cafiero/Extender: Fredirick Maudlin ?Shon Baton ?Weeks in Treatment: 0 ?Clinic Level of Care Assessment Items ?TOOL 1 Quantity Score ?'[]'$  - 0 ?Use when EandM and Procedure is performed on INITIAL visit ?ASSESSMENTS - Nursing Assessment / Reassessment ?X- 1 20 ?General Physical Exam (combine w/ comprehensive assessment (listed just below) when performed on new pt. evals) ?X- 1 25 ?Comprehensive Assessment (HX, ROS, Risk Assessments, Wounds Hx, etc.) ?ASSESSMENTS - Wound and Skin Assessment / Reassessment ?'[]'$  - 0 ?Dermatologic / Skin Assessment (not related to wound area) ?ASSESSMENTS - Ostomy and/or Continence Assessment and Care ?'[]'$  - 0 ?Incontinence Assessment and Management ?'[]'$  - 0 ?Ostomy Care Assessment and Management (repouching, etc.) ?PROCESS - Coordination of Care ?X - Simple Patient / Family Education for ongoing care 1 15 ?'[]'$  - 0 ?Complex (extensive) Patient / Family Education for ongoing care ?X- 1 10 ?Staff obtains Consents, Records, T Results / Process Orders ?est ?'[]'$  - 0 ?Staff telephones HHA, Nursing Homes / Clarify orders / etc ?'[]'$  - 0 ?Routine Transfer to another Facility (non-emergent condition) ?'[]'$  - 0 ?Routine Hospital Admission (non-emergent condition) ?X- 1 15 ?New Admissions / Biomedical engineer / Ordering NPWT Apligraf, etc. ?, ?'[]'$  - 0 ?Emergency Hospital Admission (emergent condition) ?PROCESS - Special Needs ?'[]'$  - 0 ?Pediatric / Minor Patient Management ?'[]'$  - 0 ?Isolation Patient Management ?'[]'$  - 0 ?Hearing / Language / Visual special needs ?'[]'$  - 0 ?Assessment of Community assistance (transportation, D/C planning, etc.) ?'[]'$  - 0 ?Additional assistance / Altered mentation ?'[]'$  - 0 ?Support Surface(s) Assessment (bed, cushion, seat, etc.) ?INTERVENTIONS - Miscellaneous ?'[]'$  - 0 ?External ear exam ?'[]'$  - 0 ?Patient Transfer (multiple staff / Civil Service fast streamer / Similar  devices) ?'[]'$  - 0 ?Simple Staple / Suture removal (25 or less) ?'[]'$  - 0 ?Complex Staple / Suture removal (26 or more) ?'[]'$  - 0 ?Hypo/Hyperglycemic Management (do not check if billed separately) ?X- 1 15 ?Ankle / Brachial Index (ABI) - do not check if billed separately ?Has the patient been  seen at the hospital within the last three years: Yes ?Total Score: 100 ?Level Of Care: New/Established - Level 3 ?Electronic Signature(s) ?Signed: 08/16/2021 5:41:11 PM By: Baruch Gouty RN, BSN ?Signed: 08/16/2021 5:41:11 PM By: Baruch Gouty RN, BSN ?Entered By: Baruch Gouty on 08/16/2021 10:08:06 ?-------------------------------------------------------------------------------- ?Compression Therapy Details ?Patient Name: ?Date of Service: ?Levi Jan HN A. 08/16/2021 9:00 A M ?Medical Record Number: [id number] ?Patient Account Number: [id number] ?Date of Birth/Sex: ?Treating RN: ?Oct 27, 1945 (75 y.o. [name] ?Primary Care Gaylan Fauver: Shon Baton ?Other Clinician: ?Referring Eliasar Hlavaty: ?Treating Thayden Lemire/Extender: Fredirick Maudlin ?Shon Baton ?Weeks in Treatment: 0 ?Compression Therapy Performed for Wound Assessment: Wound #2 Right,Distal,Anterior Lower Leg ?Performed By: Clinician Baruch Gouty, RN ?Compression Type: Three Layer ?Post Procedure Diagnosis ?Same as Pre-procedure ?Electronic Signature(s) ?Signed: 08/16/2021 5:41:11 PM By: Baruch Gouty RN, BSN ?Entered By: Baruch Gouty on 08/16/2021 09:45:36 ?-------------------------------------------------------------------------------- ?Encounter Discharge Information Details ?Patient Name: ?Date of Service: ?Levi Jan HN A. 08/16/2021 9:00 A M ?Medical Record Number: [id number] ?Patient Account Number: [id number] ?Date of Birth/Sex: ?Treating RN: ?04/13/1946 (76 y.o. [name] ?Primary Care Dnyla Antonetti: Shon Baton ?Other Clinician: ?Referring Marni Franzoni: ?Treating Kennedy Brines/Extender: Fredirick Maudlin ?Shon Baton ?Weeks in Treatment: 0 ?Encounter Discharge  Information Items Post Procedure Vitals ?Discharge Condition: Stable ?Temperature (F): 97.9 ?Ambulatory Status: [name] ?Pulse (bpm): 86 ?Discharge Destination: Home ?Respiratory Rate (breaths/min): 18 ?Transportation: Private Auto ?Blood Pressure (mmHg): 119/73 ?Accompanied By: spouse ?Schedule Follow-up Appointment: Yes ?Clinical Summary of Care: Patient Declined ?Electronic Signature(s) ?Signed: 08/16/2021 5:41:11 PM By: Baruch Gouty RN, BSN ?Entered By: Baruch Gouty on 08/16/2021 10:09:30 ?-------------------------------------------------------------------------------- ?Lower Extremity Assessment Details ?Patient Name: ?Date of Service: ?Levi Jan HN A. 08/16/2021 9:00 A M ?Medical Record Number: [id number] ?Patient Account Number: [id number] ?Date of Birth/Sex: ?Treating RN: ?1945-07-22 (76 y.o. [name] ?Primary Care Joella Saefong: Shon Baton ?Other Clinician: ?Referring Numair Masden: ?Treating Brooks Kinnan/Extender: Fredirick Maudlin ?Shon Baton ?Weeks in Treatment: 0 ?Edema Assessment ?Assessed: [Left: No] [Right: No] ?Edema: [Left: Ye] [Right: s] ?Calf ?Left: Right: ?Point of Measurement: From Medial Instep 35.8 cm ?Ankle ?Left: Right: ?Point of Measurement: From Medial Instep 24 cm ?Vascular Assessment ?Pulses: ?Dorsalis Pedis ?Palpable: [Right:Yes] ?Notes ?right DP and PT pulses noncompressible ?Electronic Signature(s) ?Signed: 08/16/2021 5:41:11 PM By: Baruch Gouty RN, BSN ?Entered By: Baruch Gouty on 08/16/2021 09:21:05 ?-------------------------------------------------------------------------------- ?Multi Wound Chart Details ?Patient Name: ?Date of Service: ?Levi Jan HN A. 08/16/2021 9:00 A M ?Medical Record Number: [id number] ?Patient Account Number: [id number] ?Date of Birth/Sex: ?Treating RN: ?01/11/1946 (76 y.o. [name] ?Primary Care Lamis Behrmann: Shon Baton ?Other Clinician: ?Referring Lillymae Duet: ?Treating Lougenia Morrissey/Extender: Fredirick Maudlin ?Shon Baton ?Weeks in Treatment: 0 ?Vital  Signs ?Height(in): 74 ?Pulse(bpm): 86 ?Weight(lbs): 187 ?Blood Pressure(mmHg): 119/73 ?Body Mass Index(BMI): 24 ?Temperature(??F): 97.9 ?Respiratory Rate(breaths/min): 18 ?Photos: [N/A:N/A] ?Right, Proximal Lower Leg Right, Dis

## 2021-08-15 NOTE — Patient Instructions (Signed)
Medication Instructions:  ?Your physician recommends that you continue on your current medications as directed. Please refer to the Current Medication list given to you today. ? ?*If you need a refill on your cardiac medications before your next appointment, please call your pharmacy* ? ?Lab Work: ?TODAY: BMET ?If you have labs (blood work) drawn today and your tests are completely normal, you will receive your results only by: ?MyChart Message (if you have MyChart) OR ?A paper copy in the mail ?If you have any lab test that is abnormal or we need to change your treatment, we will call you to review the results. ? ?Testing/Procedures: ?NONE ? ?Follow-Up: ?At [facility], you and your health needs are our priority.  As part of our continuing mission to provide you with exceptional heart care, we have created designated Provider Care Teams.  These Care Teams include your primary Cardiologist (physician) and Advanced Practice Providers (APPs -  Physician Assistants and Nurse Practitioners) who all work together to provide you with the care you need, when you need it. ? ?Your next appointment:   ?3 month(s) ? ?The format for your next appointment:   ?In Person ? ?Provider:   ?Candee Furbish, MD  or Christen Bame, NP  ?

## 2021-08-16 ENCOUNTER — Encounter (HOSPITAL_BASED_OUTPATIENT_CLINIC_OR_DEPARTMENT_OTHER): Payer: Medicare Other | Attending: General Surgery | Admitting: General Surgery

## 2021-08-16 DIAGNOSIS — K219 Gastro-esophageal reflux disease without esophagitis: Secondary | ICD-10-CM | POA: Diagnosis not present

## 2021-08-16 DIAGNOSIS — Z7901 Long term (current) use of anticoagulants: Secondary | ICD-10-CM | POA: Diagnosis not present

## 2021-08-16 DIAGNOSIS — G629 Polyneuropathy, unspecified: Secondary | ICD-10-CM | POA: Insufficient documentation

## 2021-08-16 DIAGNOSIS — T380X5S Adverse effect of glucocorticoids and synthetic analogues, sequela: Secondary | ICD-10-CM | POA: Diagnosis not present

## 2021-08-16 DIAGNOSIS — M1990 Unspecified osteoarthritis, unspecified site: Secondary | ICD-10-CM | POA: Diagnosis not present

## 2021-08-16 DIAGNOSIS — J45909 Unspecified asthma, uncomplicated: Secondary | ICD-10-CM | POA: Diagnosis not present

## 2021-08-16 DIAGNOSIS — I509 Heart failure, unspecified: Secondary | ICD-10-CM | POA: Insufficient documentation

## 2021-08-16 DIAGNOSIS — I110 Hypertensive heart disease with heart failure: Secondary | ICD-10-CM | POA: Diagnosis not present

## 2021-08-16 DIAGNOSIS — W19XXXS Unspecified fall, sequela: Secondary | ICD-10-CM | POA: Insufficient documentation

## 2021-08-16 DIAGNOSIS — R296 Repeated falls: Secondary | ICD-10-CM | POA: Insufficient documentation

## 2021-08-16 DIAGNOSIS — I4891 Unspecified atrial fibrillation: Secondary | ICD-10-CM | POA: Insufficient documentation

## 2021-08-16 DIAGNOSIS — I5022 Chronic systolic (congestive) heart failure: Secondary | ICD-10-CM | POA: Diagnosis not present

## 2021-08-16 DIAGNOSIS — Z87891 Personal history of nicotine dependence: Secondary | ICD-10-CM | POA: Insufficient documentation

## 2021-08-16 DIAGNOSIS — J449 Chronic obstructive pulmonary disease, unspecified: Secondary | ICD-10-CM | POA: Insufficient documentation

## 2021-08-16 DIAGNOSIS — M5030 Other cervical disc degeneration, unspecified cervical region: Secondary | ICD-10-CM | POA: Diagnosis not present

## 2021-08-16 DIAGNOSIS — L97812 Non-pressure chronic ulcer of other part of right lower leg with fat layer exposed: Secondary | ICD-10-CM | POA: Diagnosis not present

## 2021-08-16 DIAGNOSIS — Z7902 Long term (current) use of antithrombotics/antiplatelets: Secondary | ICD-10-CM | POA: Diagnosis not present

## 2021-08-16 DIAGNOSIS — K449 Diaphragmatic hernia without obstruction or gangrene: Secondary | ICD-10-CM | POA: Diagnosis not present

## 2021-08-16 DIAGNOSIS — M48062 Spinal stenosis, lumbar region with neurogenic claudication: Secondary | ICD-10-CM | POA: Diagnosis not present

## 2021-08-16 LAB — BASIC METABOLIC PANEL
BUN/Creatinine Ratio: 23 (ref 10–24)
BUN: 21 mg/dL (ref 8–27)
CO2: 24 mmol/L (ref 20–29)
Calcium: 9.9 mg/dL (ref 8.6–10.2)
Chloride: 100 mmol/L (ref 96–106)
Creatinine, Ser: 0.91 mg/dL (ref 0.76–1.27)
Glucose: 112 mg/dL — ABNORMAL HIGH (ref 70–99)
Potassium: 4.6 mmol/L (ref 3.5–5.2)
Sodium: 143 mmol/L (ref 134–144)
eGFR: 88 mL/min/{1.73_m2} (ref >59)

## 2021-08-16 NOTE — Progress Notes (Signed)
Levi, [name] ([id number]) ?Visit Report for 08/16/2021 ?Chief Complaint Document Details ?Patient Name: Date of Service: ?Levi Jan HN A. 08/16/2021 9:00 A M ?Medical Record Number: [id number] ?Patient Account Number: [id number] ?Date of Birth/Sex: Treating RN: ?[birth year] (76 y.o. Levi [name] ?Primary Care Provider: Shon [name] Other Clinician: ?Referring Provider: ?Treating Provider/Extender: Levi [name] ?Levi [name] ?Weeks in Treatment: 0 ?Information Obtained from: Patient ?Chief Complaint ?Patient seen for complaints of Non-Healing Wound (x2). ?Electronic Signature(s) ?Signed: 08/16/2021 10:26:41 AM By: Levi Maudlin MD FACS ?Entered By: Levi [name] on 08/16/2021 10:26:41 ?-------------------------------------------------------------------------------- ?Debridement Details ?Patient Name: Date of Service: ?Levi Jan HN A. 08/16/2021 9:00 A M ?Medical Record Number: [id number] ?Patient Account Number: [id number] ?Date of Birth/Sex: Treating RN: ?[birth year] (76 y.o. Levi [name] ?Primary Care Provider: Shon [name] Other Clinician: ?Referring Provider: ?Treating Provider/Extender: Levi [name] ?Levi [name] ?Weeks in Treatment: 0 ?Debridement Performed for Assessment: Wound #1 Right,Proximal,Anterior Lower Leg ?Performed By: Physician Levi Maudlin, MD ?Debridement Type: Debridement ?Severity of Tissue Pre Debridement: Fat layer exposed ?Level of Consciousness (Pre-procedure): Awake and Alert ?Pre-procedure Verification/Time Out Yes - 09:35 ?Taken: ?Start Time: 09:38 ?Pain Control: Lidocaine 4% T opical Solution ?T Area Debrided (L x W): ?otal 5.3 (cm) x 0.6 (cm) = 3.18 (cm?) ?Tissue and other material debrided: ?Viable, Non-Viable, Slough, Subcutaneous, Skin: Epidermis, Fibrin/Exudate, Slough ?Level: Skin/Subcutaneous Tissue ?Debridement Description: Excisional ?Instrument: Curette ?Bleeding: Minimum ?Hemostasis Achieved: Pressure ?Procedural Pain: 2 ?Post Procedural Pain: 0 ?Response to  Treatment: Procedure was tolerated well ?Level of Consciousness (Post- Awake and Alert ?procedure): ?Post Debridement Measurements of Total Wound ?Length: (cm) 5.3 ?Width: (cm) 0.6 ?Depth: (cm) 0.1 ?Volume: (cm?) 0.25 ?Character of Wound/Ulcer Post Debridement: Improved ?Severity of Tissue Post Debridement: Fat layer exposed ?Post Procedure Diagnosis ?Same as Pre-procedure ?Electronic Signature(s) ?Signed: 08/16/2021 12:32:07 PM By: Levi Maudlin MD FACS ?Signed: 08/16/2021 5:41:11 PM By: Levi Gouty RN, BSN ?Entered By: Levi [name] on 08/16/2021 09:44:31 ?-------------------------------------------------------------------------------- ?Debridement Details ?Patient Name: ?Date of Service: ?Levi Jan HN A. 08/16/2021 9:00 A M ?Medical Record Number: [id number] ?Patient Account Number: [id number] ?Date of Birth/Sex: ?Treating RN: ?1946-04-01 (76 y.o. Levi [name] ?Primary Care Provider: Shon [name] ?Other Clinician: ?Referring Provider: ?Treating Provider/Extender: Levi [name] ?Levi [name] ?Weeks in Treatment: 0 ?Debridement Performed for Assessment: Wound #2 Right,Distal,Anterior Lower Leg ?Performed By: Physician Levi Maudlin, MD ?Debridement Type: Debridement ?Severity of Tissue Pre Debridement: Fat layer exposed ?Level of Consciousness (Pre-procedure): Awake and Alert ?Pre-procedure Verification/Time Out Yes - 09:35 ?Taken: ?Start Time: 09:38 ?Pain Control: Lidocaine 4% T opical Solution ?T Area Debrided (L x W): ?otal 4.6 (cm) x 1.1 (cm) = 5.06 (cm?) ?Tissue and other material debrided: ?Viable, Non-Viable, Slough, Subcutaneous, Skin: Epidermis, Fibrin/Exudate, Slough ?Level: Skin/Subcutaneous Tissue ?Debridement Description: Excisional ?Instrument: Curette ?Bleeding: Minimum ?Hemostasis Achieved: Pressure ?Procedural Pain: 2 ?Post Procedural Pain: 0 ?Response to Treatment: Procedure was tolerated well ?Level of Consciousness (Post- Awake and Alert ?procedure): ?Post Debridement Measurements  of Total Wound ?Length: (cm) 4.6 ?Width: (cm) 1.1 ?Depth: (cm) 0.1 ?Volume: (cm?) 0.397 ?Character of Wound/Ulcer Post Debridement: Improved ?Severity of Tissue Post Debridement: Fat layer exposed ?Post Procedure Diagnosis ?Same as Pre-procedure ?Electronic Signature(s) ?Signed: 08/16/2021 12:32:07 PM By: Levi Maudlin MD FACS ?Signed: 08/16/2021 5:41:11 PM By: Levi Gouty RN, BSN ?Entered By: Levi [name] on 08/16/2021 09:45:02 ?-------------------------------------------------------------------------------- ?HPI Details ?Patient Name: ?Date of Service: ?Levi Jan HN A. 08/16/2021 9:00 A M ?Medical Record Number: [id number] ?Patient Account Number: [id number] ?Date of Birth/Sex: Treating RN: ?Sep 01, 1945 (76 y.o. Levi [name] ?Primary Care Provider: Virgina [name],  Levi [name] Other Clinician: ?Referring Provider: ?Treating Provider/Extender: Levi [name] ?Levi [name] ?Weeks in Treatment: 0 ?History of Present Illness ?HPI Description: ADMISSION ?08/16/2021 ?This is a 76 year old man with a history of multiple falls here for evaluation of 2 traumatic wounds to his right lower extremity. He had a fall in December which ?resulted in the laceration that is proximal and a fall in February that opened up the distal wound. His falls are secondary to significant idiopathic peripheral ?neuropathy; he just cannot tell where his feet are sometimes. He has also had multiple orthopedic surgeries including on his back for neurogenic claudication. ?He does not have diabetes. He does have COPD and is currently taking 20 mg of prednisone daily. He is on Eliquis for atrial fibrillation. At 1 point he did take ?doxycycline for the wounds and since, they have been applying bacitracin. He does have compression stockings which he says he wears, but he is not ?wearing them in clinic today. He is accompanied by his wife. ?On the anterior tibial surface of his right leg, there are 2 vertical wounds consistent with traumatic lacerations. The  periwound skin is intact and there is no ?erythema induration or purulent drainage present. Both have significant slough and wound debris. ABIs in clinic were noncompressible. ?Electronic Signature(s) ?Signed: 08/16/2021 10:32:26 AM By: Levi Maudlin MD FACS ?Previous Signature: 08/16/2021 10:31:42 AM Version By: Levi Maudlin MD FACS ?Entered By: Levi [name] on 08/16/2021 10:32:26 ?-------------------------------------------------------------------------------- ?Physical Exam Details ?Patient Name: Date of Service: ?Levi Jan HN A. 08/16/2021 9:00 A M ?Medical Record Number: [id number] ?Patient Account Number: [id number] ?Date of Birth/Sex: Treating RN: ?[birth year] (76 y.o. Levi [name] ?Primary Care Provider: Shon [name] Other Clinician: ?Referring Provider: ?Treating Provider/Extender: Levi [name] ?Levi [name] ?Weeks in Treatment: 0 ?Constitutional ?. . . No acute distress. ?Respiratory ?Normal work of breathing on room air.[name] ?Cardiovascular ?Palpable dorsalis pedis pulse on the right.[name] ?Notes ?08/16/2021: Wound examon the right lower extremity on the anterior tibial surface, there are 2 vertical wounds consistent with traumatic lacerations. Both have ?significant slough and wound debris present. The underlying surface, once this material was removed, is fairly healthy. No periwound skin breakdown. No ?erythema, induration, or drainage to suggest infection. ?Electronic Signature(s) ?Signed: 08/16/2021 10:34:19 AM By: Levi Maudlin MD FACS ?Entered By: Levi [name] on 08/16/2021 10:34:19 ?-------------------------------------------------------------------------------- ?Physician Orders Details ?Patient Name: Date of Service: ?Levi Jan HN A. 08/16/2021 9:00 A M ?Medical Record Number: [id number] ?Patient Account Number: [id number] ?Date of Birth/Sex: Treating RN: ?07/10/1945 (76 y.o. 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

## 2021-08-16 NOTE — Progress Notes (Signed)
[name], Levi ([id number]) ?Visit Report for 08/16/2021 ?Abuse Risk Screen Details ?Patient Name: Date of Service: ?Levi [name]. 08/16/2021 9:00 [name] M ?Medical Record Number: [id number] ?Patient Account Number: [id number] ?Date of Birth/Sex: Treating RN: ?1945-08-11 (76 y.o. Levi [name] ?Primary Care Charline Hoskinson: Shon Baton Other Clinician: ?Referring Zavian Slowey: ?Treating Veldon Wager/Extender: Fredirick Maudlin ?Shon Baton ?Weeks in Treatment: 0 ?Abuse Risk Screen Items ?Answer ?ABUSE RISK SCREEN: ?Has anyone close to you tried to hurt or harm you recentlyo No ?Do you feel uncomfortable with anyone in your familyo No ?Has anyone forced you do things that you didnt want to doo No ?Electronic Signature(s) ?Signed: 08/16/2021 5:41:11 PM By: Baruch Gouty RN, BSN ?Entered By: Baruch Gouty on 08/16/2021 09:11:11 ?-------------------------------------------------------------------------------- ?Activities of Daily Living Details ?Patient Name: Date of Service: ?Levi [name]. 08/16/2021 9:00 [name] M ?Medical Record Number: [id number] ?Patient Account Number: [id number] ?Date of Birth/Sex: Treating RN: ?1946/02/24 (76 y.o. Levi [name] ?Primary Care Erial Fikes: Shon Baton Other Clinician: ?Referring Tonique Mendonca: ?Treating Jarrin Staley/Extender: Fredirick Maudlin ?Shon Baton ?Weeks in Treatment: 0 ?Activities of Daily Living Items ?Answer ?Activities of Daily Living (Please select one for each item) ?Liberal ?T Medications ?ake Completely Able ?Use T elephone Completely Able ?Care for Appearance Completely Able ?Use T oilet Completely Able ?Bath / Shower Completely Able ?Dress Self Completely Able ?Feed Self Completely Able ?Walk Completely Able ?Get In / Out Bed Completely Able ?Housework Completely Able ?Prepare Meals Completely Able ?Handle Money Completely Able ?Shop for Self Completely Able ?Electronic Signature(s) ?Signed: 08/16/2021 5:41:11 PM By: Baruch Gouty RN, BSN ?Entered By: Baruch Gouty on 08/16/2021 09:11:40 ?-------------------------------------------------------------------------------- ?Education Screening Details ?Patient Name: ?Date of Service: ?Levi [name]. 08/16/2021 9:00 [name] M ?Medical Record Number: [id number] ?Patient Account Number: [id number] ?Date of Birth/Sex: ?Treating RN: ?10-19-1945 (76 y.o. Levi [name] ?Primary Care Tandi Hanko: Shon Baton ?Other Clinician: ?Referring Merikay Lesniewski: ?Treating Brae Schaafsma/Extender: Fredirick Maudlin ?Shon Baton ?Weeks in Treatment: 0 ?Primary Learner Assessed: Patient ?Learning Preferences/Education Level/Primary Language ?Learning Preference: Explanation, Demonstration, Printed Material ?Highest Education Level: College or Above ?Preferred Language: English ?Cognitive Barrier ?Language Barrier: No ?Translator Needed: No ?Memory Deficit: No ?Emotional Barrier: No ?Cultural/Religious Beliefs Affecting Medical Care: No ?Physical Barrier ?Impaired Vision: Yes Glasses, reading ?Impaired Hearing: No ?Decreased Hand dexterity: No ?Knowledge/Comprehension ?Knowledge Level: High ?Comprehension Level: High ?Ability to understand written instructions: High ?Ability to understand verbal instructions: High ?Motivation ?Anxiety Level: Calm ?Cooperation: Cooperative ?Education Importance: Acknowledges Need ?Interest in Health Problems: Asks Questions ?Perception: Coherent ?Willingness to Engage in Self-Management High ?Activities: ?Electronic Signature(s) ?Signed: 08/16/2021 5:41:11 PM By: Baruch Gouty RN, BSN ?Entered By: Baruch Gouty on 08/16/2021 09:12:10 ?-------------------------------------------------------------------------------- ?Fall Risk Assessment Details ?Patient Name: ?Date of Service: ?Levi [name]. 08/16/2021 9:00 [name] M ?Medical Record Number: [id number] ?Patient Account Number: [id number] ?Date of Birth/Sex: ?Treating RN: ?22-Jun-1945 (76 y.o. Levi [name] ?Primary Care Cheyann Blecha: Shon Baton ?Other Clinician: ?Referring  Axelle Szwed: ?Treating Lorma Heater/Extender: Fredirick Maudlin ?Shon Baton ?Weeks in Treatment: 0 ?Fall Risk Assessment Items ?Have you had 2 or more falls in the last 12 monthso 0 Yes ?Have you had any fall that resulted in injury in the last 12 monthso 0 Yes ?FALLS RISK SCREEN ?History of falling - immediate or within 3 months 25 Yes ?Secondary diagnosis (Do you have 2 or more medical diagnoseso) 0 No ?Ambulatory aid ?None/bed rest/wheelchair/nurse 0 No ?Crutches/cane/walker 15 Yes ?Furniture 0 No ?Intravenous therapy Access/Saline/Heparin Lock 0 No ?Gait/Transferring ?Normal/ bed rest/ wheelchair 0 Yes ?Weak (short steps with  or without shuffle, stooped but able to lift head while walking, may seek 0 No ?support from furniture) ?Impaired (short steps with shuffle, may have difficulty arising from chair, head down, impaired 0 No ?balance) ?Mental Status ?Oriented to own ability 0 Yes ?Electronic Signature(s) ?Signed: 08/16/2021 5:41:11 PM By: Baruch Gouty RN, BSN ?Entered By: Baruch Gouty on 08/16/2021 09:12:36 ?-------------------------------------------------------------------------------- ?Foot Assessment Details ?Patient Name: ?Date of Service: ?Levi [name]. 08/16/2021 9:00 [name] M ?Medical Record Number: [id number] ?Patient Account Number: [id number] ?Date of Birth/Sex: ?Treating RN: ?1946/04/19 (76 y.o. Levi [name] ?Primary Care Thelbert Gartin: Shon Baton ?Other Clinician: ?Referring Kileigh Ortmann: ?Treating Kinslea Frances/Extender: Fredirick Maudlin ?Shon Baton ?Weeks in Treatment: 0 ?Foot Assessment Items ?Site Locations ?+ = Sensation present, - = Sensation absent, C = Callus, U = Ulcer ?R = Redness, W = Warmth, M = Maceration, PU = Pre-ulcerative lesion ?F = Fissure, S = Swelling, D = Dryness ?Assessment ?Right: Left: ?Other Deformity: No No ?Prior Foot Ulcer: No No ?Prior Amputation: No No ?Charcot Joint: No No ?Ambulatory Status: Ambulatory With Help ?Assistance Device: Cane ?Gait: Steady ?Electronic  Signature(s) ?Signed: 08/16/2021 5:41:11 PM By: Baruch Gouty RN, BSN ?Entered By: Baruch Gouty on 08/16/2021 09:16:04 ?-------------------------------------------------------------------------------- ?Nutrition Risk Screening Details ?Patient Name: ?Date of Service: ?Levi [name]. 08/16/2021 9:00 [name] M ?Medical Record Number: [id number] ?Patient Account Number: [id number] ?Date of Birth/Sex: ?Treating RN: ?1946-03-12 (76 y.o. Levi [name] ?Primary Care Chantay Whitelock: Shon Baton ?Other Clinician: ?Referring Chad Tiznado: ?Treating Samarrah Tranchina/Extender: Fredirick Maudlin ?Shon Baton ?Weeks in Treatment: 0 ?Height (in): 74 ?Weight (lbs): 187 ?Body Mass Index (BMI): 24 ?Nutrition Risk Screening Items ?Score Screening ?NUTRITION RISK SCREEN: ?I have an illness or condition that made me change the kind and/or amount of food I eat 0 No ?I eat fewer than two meals per day 0 No ?I eat few fruits and vegetables, or milk products 0 No ?I have three or more drinks of beer, liquor or wine almost every day 0 No ?I have tooth or mouth problems that make it hard for me to eat 0 No ?I don't always have enough money to buy the food I need 0 No ?I eat alone most of the time 0 No ?I take three or more different prescribed or over-the-counter drugs [name] day 1 Yes ?Without wanting to, I have lost or gained 10 pounds in the last six months 2 Yes ?I am not always physically able to shop, cook and/or feed myself 0 No ?Nutrition Protocols ?Good Risk Protocol ?Moderate Risk Protocol 0 Provide education on nutrition ?High Risk Proctocol ?Risk Level: Moderate Risk ?Score: 3 ?Electronic Signature(s) ?Signed: 08/16/2021 5:41:11 PM By: Baruch Gouty RN, BSN ?Entered By: Baruch Gouty on 08/16/2021 09:14:04 ?

## 2021-08-17 ENCOUNTER — Telehealth: Payer: Self-pay

## 2021-08-17 NOTE — Telephone Encounter (Signed)
-----   Message from Lavena Bullion, DO sent at 08/17/2021  1:49 PM EDT ----- ?Regarding: FW: addl medical options ?Can you get Levi [name] an appointment to come in to see me to talk about additional medical management options for his reflux?  Thank you. ? ?----- Message ----- ?From: Greer Pickerel, MD ?Sent: 08/16/2021   5:05 PM EDT ?To: Lavena Bullion, DO ?Subject: addl medical options                          ? ?, Hi, ? ?I saw him in follow-up after he saw the cardiologist twice in the last month to get on Lasix for his edema which is resolved.  She is not sure if surgical intervention is best for him given the fact that he is already on chronic prednisone and with his falls.  Although he has not had a fall recently.  Any opportunity to enhance his medical therapy?  ? ?They say that his symptoms are generally well managed but occasionally he will have a severe episode and that is what really bothers him when he has a severe episode of pain -any medication suggestions ? ?My note from today should be in care everywhere soon. ? ?I did suggest potential second opinion. ? ?I am just concerned about his recurrence risk and leaving him in a worse position ? ? ?

## 2021-08-17 NOTE — Telephone Encounter (Signed)
Called and spoke with patient. Pt has been scheduled for a follow up appt with Dr. Bryan Lemma on Monday, 09/10/21 at 8:40 am. Pt is aware that this appt is at the [facility] office. Pt verbalized understanding and had no concerns at the end of the call. ?

## 2021-08-20 ENCOUNTER — Ambulatory Visit: Admit: 2021-08-20 | Payer: Medicare Other | Admitting: General Surgery

## 2021-08-20 SURGERY — FUNDOPLICATION, NISSEN, ROBOT-ASSISTED, LAPAROSCOPIC
Anesthesia: General

## 2021-08-23 ENCOUNTER — Encounter (HOSPITAL_BASED_OUTPATIENT_CLINIC_OR_DEPARTMENT_OTHER): Payer: Medicare Other | Admitting: General Surgery

## 2021-08-23 DIAGNOSIS — T380X5S Adverse effect of glucocorticoids and synthetic analogues, sequela: Secondary | ICD-10-CM | POA: Diagnosis not present

## 2021-08-23 DIAGNOSIS — R296 Repeated falls: Secondary | ICD-10-CM | POA: Diagnosis not present

## 2021-08-23 DIAGNOSIS — G629 Polyneuropathy, unspecified: Secondary | ICD-10-CM | POA: Diagnosis not present

## 2021-08-23 DIAGNOSIS — L97812 Non-pressure chronic ulcer of other part of right lower leg with fat layer exposed: Secondary | ICD-10-CM | POA: Diagnosis not present

## 2021-08-23 DIAGNOSIS — M48062 Spinal stenosis, lumbar region with neurogenic claudication: Secondary | ICD-10-CM | POA: Diagnosis not present

## 2021-08-23 DIAGNOSIS — J449 Chronic obstructive pulmonary disease, unspecified: Secondary | ICD-10-CM | POA: Diagnosis not present

## 2021-08-23 NOTE — Progress Notes (Signed)
[name], [name] ([id number]) ?Visit Report for 08/23/2021 ?Arrival Information Details ?Patient Name: Date of Service: ?Levi [name]. 08/23/2021 12:30 PM ?Medical Record Number: [id number] ?Patient Account Number: [id number] ?Date of Birth/Sex: Treating RN: ?[birth year] (76 y.o. Levi [name] ?Primary Care Garritt Molyneux: Shon Baton Other Clinician: ?Referring Kirin Brandenburger: ?Treating Kyian Obst/Extender: Fredirick Maudlin ?Shon Baton ?Weeks in Treatment: 1 ?Visit Information History Since Last Visit ?Added or deleted any medications: No ?Patient Arrived: Levi [name] ?Any new allergies or adverse reactions: No ?Arrival Time: 12:41 ?Had [name] fall or experienced change in No ?Accompanied By: wife ?activities of daily living that may affect ?Transfer Assistance: None ?risk of falls: ?Patient Identification Verified: Yes ?Signs or symptoms of abuse/neglect since last visito No ?Secondary Verification Process Completed: Yes ?Hospitalized since last visit: No ?Patient Requires Transmission-Based Precautions: No ?Implantable device outside of the clinic excluding No ?Patient Has Alerts: Yes ?cellular tissue based products placed in the center ?Patient Alerts: Patient on Blood Thinner since last visit: ?R ABI N/C Pain Present Now: Yes ?Electronic Signature(s) ?Signed: 08/23/2021 5:51:22 PM By: Baruch Gouty RN, BSN ?Entered By: Baruch Gouty on 08/23/2021 12:43:12 ?-------------------------------------------------------------------------------- ?Compression Therapy Details ?Patient Name: Date of Service: ?Levi [name]. 08/23/2021 12:30 PM ?Medical Record Number: [id number] ?Patient Account Number: [id number] ?Date of Birth/Sex: Treating RN: ?03/29/1946 (76 y.o. Levi [name] ?Primary Care Breyson Kelm: Shon Baton Other Clinician: ?Referring Margia Wiesen: ?Treating Daoud Lobue/Extender: Fredirick Maudlin ?Shon Baton ?Weeks in Treatment: 1 ?Compression Therapy Performed for Wound Assessment: Wound #2 Right,Distal,Anterior Lower Leg ?Performed By:  Clinician Baruch Gouty, RN ?Compression Type: Three Layer ?Post Procedure Diagnosis ?Same as Pre-procedure ?Electronic Signature(s) ?Signed: 08/23/2021 5:51:22 PM By: Baruch Gouty RN, BSN ?Entered By: Baruch Gouty on 08/23/2021 12:59:59 ?-------------------------------------------------------------------------------- ?Encounter Discharge Information Details ?Patient Name: ?Date of Service: ?Levi [name]. 08/23/2021 12:30 PM ?Medical Record Number: [id number] ?Patient Account Number: [id number] ?Date of Birth/Sex: ?Treating RN: ?08-24-1945 (76 y.o. Levi [name] ?Primary Care [name]: Shon Baton ?Other Clinician: ?Referring [name]: ?Treating Stacye Noori/Extender: Fredirick Maudlin ?Shon Baton ?Weeks in Treatment: 1 ?Encounter Discharge Information Items Post Procedure Vitals ?Discharge Condition: Stable ?Temperature (F): 97.7 ?Ambulatory Status: Levi [name] ?Pulse (bpm): 89 ?Discharge Destination: Home ?Respiratory Rate (breaths/min): 18 ?Transportation: Private Auto ?Blood Pressure (mmHg): 118/74 ?Accompanied By: wife ?Schedule Follow-up Appointment: Yes ?Clinical Summary of Care: Patient Declined ?Electronic Signature(s) ?Signed: 08/23/2021 5:51:22 PM By: Baruch Gouty RN, BSN ?Entered By: Baruch Gouty on 08/23/2021 13:24:37 ?-------------------------------------------------------------------------------- ?Lower Extremity Assessment Details ?Patient Name: ?Date of Service: ?Levi [name]. 08/23/2021 12:30 PM ?Medical Record Number: [id number] ?Patient Account Number: [id number] ?Date of Birth/Sex: ?Treating RN: ?15-Sep-1945 (76 y.o. Levi [name] ?Primary Care Sonny Poth: Shon Baton ?Other Clinician: ?Referring Havanna Groner: ?Treating Nivea Wojdyla/Extender: Fredirick Maudlin ?Shon Baton ?Weeks in Treatment: 1 ?Edema Assessment ?Assessed: [Left: No] [Right: No] ?Edema: [Left: Ye] [Right: s] ?Calf ?Left: Right: ?Point of Measurement: From Medial Instep 33.1 cm ?Ankle ?Left: Right: ?Point of Measurement: From  Medial Instep 22.3 cm ?Vascular Assessment ?Pulses: ?Dorsalis Pedis ?Palpable: [Right:Yes] ?Electronic Signature(s) ?Signed: 08/23/2021 5:51:22 PM By: Baruch Gouty RN, BSN ?Entered By: Baruch Gouty on 08/23/2021 12:47:14 ?-------------------------------------------------------------------------------- ?Multi Wound Chart Details ?Patient Name: ?Date of Service: ?Levi [name]. 08/23/2021 12:30 PM ?Medical Record Number: [id number] ?Patient Account Number: [id number] ?Date of Birth/Sex: ?Treating RN: ?06/27/1945 (76 y.o. Levi [name] ?Primary Care Nori Winegar: Shon Baton ?Other Clinician: ?Referring Gwin Eagon: ?Treating Maika Mcelveen/Extender: Fredirick Maudlin ?Shon Baton ?Weeks in Treatment: 1 ?Vital Signs ?Height(in): 74 ?Pulse(bpm): 89 ?Weight(lbs): 187 ?Blood Pressure(mmHg): 118/74 ?Body Mass Index(BMI): 24 ?Temperature(??F): 97.7 ?Respiratory Rate(breaths/min):  18 ?Photos: [N/[name]:N/[name]] ?Right, Proximal, Anterior Lower Leg Right, Distal, Anterior Lower Leg N/[name] ?Wound Location: ?Trauma Trauma N/[name] ?Wounding Event: ?Abrasion Abrasion N/[name] ?Primary Etiology: ?Venous Leg Ulcer Venous Leg Ulcer N/[name] ?Secondary Etiology: ?Cataracts, Chronic sinus Cataracts, Chronic sinus N/[name] ?Comorbid History: ?problems/congestion, Asthma, Chronic problems/congestion, Asthma, Chronic ?Obstructive Pulmonary Disease Obstructive Pulmonary Disease ?(COPD), Pneumothorax, Arrhythmia, (COPD), Pneumothorax, Arrhythmia, ?Congestive Heart Failure, Congestive Heart Failure, ?Hypertension, Osteoarthritis, Hypertension, Osteoarthritis, ?Neuropathy Neuropathy ?05/18/2021 07/19/2021 N/[name] ?Date Acquired: ?1 1 N/[name] ?Weeks of Treatment: ?Open Open N/[name] ?Wound Status: ?No No N/[name] ?Wound Recurrence: ?3.4x0.5x0.1 4.3x1x0.1 N/[name] ?Measurements L x W x D (cm) ?1.335 3.377 N/[name] ?[name] (cm?) : ?rea ?0.134 0.338 N/[name] ?Volume (cm?) : ?46.60% 15.00% N/[name] ?% Reduction in [name] rea: ?46.40% 14.90% N/[name] ?% Reduction in Volume: ?Full Thickness Without Exposed Full Thickness Without Exposed  N/[name] ?Classification: ?Support Structures Support Structures ?Small Medium N/[name] ?Exudate [name] mount: ?Serosanguineous Serosanguineous N/[name] ?Exudate Type: ?red, brown red, brown N/[name] ?Exudate Color: ?Distinct, outline attached Distinct, outline attached N/[name] ?Wound Margin: ?Small (1-33%) Small (1-33%) N/[name] ?Granulation [name] mount: ?Red Red N/[name] ?Granulation Quality: ?Large (67-100%) Large (67-100%) N/[name] ?Necrotic [name] mount: ?Fat Layer (Subcutaneous Tissue): Yes Fat Layer (Subcutaneous Tissue): Yes N/[name] ?Exposed Structures: ?Fascia: No ?Fascia: No ?Tendon: No ?Tendon: No ?Muscle: No ?Muscle: No ?Joint: No ?Joint: No ?Bone: No ?Bone: No ?Medium (34-66%) Small (1-33%) N/[name] ?Epithelialization: ?Debridement - Excisional Debridement - Excisional N/[name] ?Debridement: ?Pre-procedure Verification/Time Out 13:00 13:00 N/[name] ?Taken: ?Lidocaine 5% topical ointment Lidocaine 5% topical ointment N/[name] ?Pain Control: ?Subcutaneous, Slough Subcutaneous, Slough N/[name] ?Tissue Debrided: ?Skin/Subcutaneous Tissue Skin/Subcutaneous Tissue N/[name] ?Level: ?12 4.3 N/[name] ?Debridement [name] (sq cm): ?rea ?Curette Curette N/[name] ?Instrument: ?Minimum Minimum N/[name] ?Bleeding: ?Pressure Pressure N/[name] ?Hemostasis [name] chieved: ?2 2 N/[name] ?Procedural Pain: ?0 0 N/[name] ?Post Procedural Pain: ?Procedure was tolerated well Procedure was tolerated well N/[name] ?Debridement Treatment Response: ?3.4x0.5x0.1 4.3x1x0.1 N/[name] ?Post Debridement Measurements L x ?W x D (cm) ?0.134 0.338 N/[name] ?Post Debridement Volume: (cm?) ?Debridement Compression Therapy N/[name] ?Procedures Performed: ?Debridement ?Treatment Notes ?Electronic Signature(s) ?Signed: 08/23/2021 1:08:35 PM By: Fredirick Maudlin MD FACS ?Signed: 08/23/2021 5:51:22 PM By: Baruch Gouty RN, BSN ?Entered By: Fredirick Maudlin on 08/23/2021 13:08:35 ?-------------------------------------------------------------------------------- ?Multi-Disciplinary Care Plan Details ?Patient Name: ?Date of Service: ?Levi [name]. 08/23/2021 12:30 PM ?Medical Record Number:  [id number] ?Patient Account Number: [id number] ?Date of Birth/Sex: ?Treating RN: ?Sep 16, 1945 (76 y.o. Levi [name] ?Primary Care Toshiye Kever: Shon Baton ?Other Clinician: ?Referring Mykai Wendorf: ?Treating Provi

## 2021-08-23 NOTE — Progress Notes (Signed)
Levi [name] ([id number]) ?Visit Report for 08/23/2021 ?Chief Complaint Document Details ?Patient Name: Date of Service: ?Levi Jan HN A. 08/23/2021 12:30 PM ?Medical Record Number: [id number] ?Patient Account Number: [id number] ?Date of Birth/Sex: Treating RN: ?Mar 11, 1946 (76 y.o. Levi [name] ?Primary Care Provider: Shon Baton Other Clinician: ?Referring Provider: ?Treating Provider/Extender: Levi [name] ?Shon Baton ?Weeks in Treatment: 1 ?Information Obtained from: Patient ?Chief Complaint ?Patient seen for complaints of Non-Healing Wound (x2). ?Electronic Signature(s) ?Signed: 08/23/2021 1:08:46 PM By: Levi Maudlin MD FACS ?Entered By: Levi [name] on 08/23/2021 13:08:45 ?-------------------------------------------------------------------------------- ?Debridement Details ?Patient Name: Date of Service: ?Levi Jan HN A. 08/23/2021 12:30 PM ?Medical Record Number: [id number] ?Patient Account Number: [id number] ?Date of Birth/Sex: Treating RN: ?[birth year] (76 y.o. Levi [name] ?Primary Care Provider: Shon Baton Other Clinician: ?Referring Provider: ?Treating Provider/Extender: Levi [name] ?Shon Baton ?Weeks in Treatment: 1 ?Debridement Performed for Assessment: Wound #2 Right,Distal,Anterior Lower Leg ?Performed By: Physician Levi Maudlin, MD ?Debridement Type: Debridement ?Severity of Tissue Pre Debridement: Fat layer exposed ?Level of Consciousness (Pre-procedure): Awake and Alert ?Pre-procedure Verification/Time Out Yes - 13:00 ?Taken: ?Start Time: 13:00 ?Pain Control: Lidocaine 5% topical ointment ?T Area Debrided (L x W): ?otal 4.3 (cm) x 1 (cm) = 4.3 (cm?) ?Tissue and other material debrided: Viable, Non-Viable, Slough, Subcutaneous, Slough ?Level: Skin/Subcutaneous Tissue ?Debridement Description: Excisional ?Instrument: Curette ?Bleeding: Minimum ?Hemostasis Achieved: Pressure ?Procedural Pain: 2 ?Post Procedural Pain: 0 ?Response to Treatment: Procedure was tolerated  well ?Level of Consciousness (Post- Awake and Alert ?procedure): ?Post Debridement Measurements of Total Wound ?Length: (cm) 4.3 ?Width: (cm) 1 ?Depth: (cm) 0.1 ?Volume: (cm?) 0.338 ?Character of Wound/Ulcer Post Debridement: Improved ?Severity of Tissue Post Debridement: Fat layer exposed ?Post Procedure Diagnosis ?Same as Pre-procedure ?Electronic Signature(s) ?Signed: 08/23/2021 5:31:11 PM By: Levi Maudlin MD FACS ?Signed: 08/23/2021 5:51:22 PM By: Baruch Gouty RN, BSN ?Entered By: Baruch Gouty on 08/23/2021 13:03:17 ?-------------------------------------------------------------------------------- ?Debridement Details ?Patient Name: ?Date of Service: ?Levi Jan HN A. 08/23/2021 12:30 PM ?Medical Record Number: [id number] ?Patient Account Number: [id number] ?Date of Birth/Sex: ?Treating RN: ?[birth year] (75 y.o. Levi [name] ?Primary Care Provider: Shon Baton ?Other Clinician: ?Referring Provider: ?Treating Provider/Extender: Levi [name] ?Shon Baton ?Weeks in Treatment: 1 ?Debridement Performed for Assessment: Wound #1 Right,Proximal,Anterior Lower Leg ?Performed By: Physician Levi Maudlin, MD ?Debridement Type: Debridement ?Severity of Tissue Pre Debridement: Fat layer exposed ?Level of Consciousness (Pre-procedure): Awake and Alert ?Pre-procedure Verification/Time Out Yes - 13:00 ?Taken: ?Start Time: 13:00 ?Pain Control: Lidocaine 5% topical ointment ?T Area Debrided (L x W): ?otal 2.4 (cm) x 5 (cm) = 12 (cm?) ?Tissue and other material debrided: Viable, Non-Viable, Slough, Subcutaneous, Slough ?Level: Skin/Subcutaneous Tissue ?Debridement Description: Excisional ?Instrument: Curette ?Bleeding: Minimum ?Hemostasis Achieved: Pressure ?Procedural Pain: 2 ?Post Procedural Pain: 0 ?Response to Treatment: Procedure was tolerated well ?Level of Consciousness (Post- Awake and Alert ?procedure): ?Post Debridement Measurements of Total Wound ?Length: (cm) 3.4 ?Width: (cm) 0.5 ?Depth: (cm)  0.1 ?Volume: (cm?) 0.134 ?Character of Wound/Ulcer Post Debridement: Improved ?Severity of Tissue Post Debridement: Fat layer exposed ?Post Procedure Diagnosis ?Same as Pre-procedure ?Electronic Signature(s) ?Signed: 08/23/2021 5:31:11 PM By: Levi Maudlin MD FACS ?Signed: 08/23/2021 5:51:22 PM By: Baruch Gouty RN, BSN ?Entered By: Baruch Gouty on 08/23/2021 13:04:30 ?-------------------------------------------------------------------------------- ?HPI Details ?Patient Name: ?Date of Service: ?Levi Jan HN A. 08/23/2021 12:30 PM ?Medical Record Number: [id number] ?Patient Account Number: [id number] ?Date of Birth/Sex: Treating RN: ?1946/03/16 (76 y.o. Levi [name] ?Primary Care Provider: Shon Baton Other Clinician: ?Referring Provider: ?Treating Provider/Extender: Levi [name] ?Shon Baton ?Weeks  in Treatment: 1 ?History of Present Illness ?HPI Description: ADMISSION ?08/16/2021 ?This is a 76 year old man with a history of multiple falls here for evaluation of 2 traumatic wounds to his right lower extremity. He had a fall in December which ?resulted in the laceration that is proximal and a fall in February that opened up the distal wound. His falls are secondary to significant idiopathic peripheral ?neuropathy; he just cannot tell where his feet are sometimes. He has also had multiple orthopedic surgeries including on his back for neurogenic claudication. ?He does not have diabetes. He does have COPD and is currently taking 20 mg of prednisone daily. He is on Eliquis for atrial fibrillation. At 1 point he did take ?doxycycline for the wounds and since, they have been applying bacitracin. He does have compression stockings which he says he wears, but he is not ?wearing them in clinic today. He is accompanied by his wife. ?On the anterior tibial surface of his right leg, there are 2 vertical wounds consistent with traumatic lacerations. The periwound skin is intact and there is no ?erythema induration or  purulent drainage present. Both have significant slough and wound debris. ABIs in clinic were noncompressible. ?08/23/2021: Today, both wounds have some accumulated slough, but the more proximal one has partially closed leaving 2 separate small openings. The ?periwound is intact. Edema control is excellent. ?Electronic Signature(s) ?Signed: 08/23/2021 1:09:34 PM By: Levi Maudlin MD FACS ?Entered By: Levi [name] on 08/23/2021 13:09:33 ?-------------------------------------------------------------------------------- ?Physical Exam Details ?Patient Name: Date of Service: ?Levi Jan HN A. 08/23/2021 12:30 PM ?Medical Record Number: [id number] ?Patient Account Number: [id number] ?Date of Birth/Sex: Treating RN: ?[birth year] (76 y.o. Levi [name] ?Primary Care Provider: Shon Baton Other Clinician: ?Referring Provider: ?Treating Provider/Extender: Levi [name] ?Shon Baton ?Weeks in Treatment: 1 ?Constitutional ?. . . . No acute distress. ?Respiratory ?Normal work of breathing on room air. ?Notes ?08/23/2021: Wound examthe proximal wound has partially closed leaving 2 separate small openings. Both wounds have slough present better cleaner than last ?week. Periwound skin is intact. Edema control is good. ?Electronic Signature(s) ?Signed: 08/23/2021 1:12:02 PM By: Levi Maudlin MD FACS ?Entered By: Levi [name] on 08/23/2021 13:12:01 ?-------------------------------------------------------------------------------- ?Physician Orders Details ?Patient Name: Date of Service: ?Levi Jan HN A. 08/23/2021 12:30 PM ?Medical Record Number: [id number] ?Patient Account Number: [id number] ?Date of Birth/Sex: Treating RN: ?1945-11-14 (76 y.o. Levi [name] ?Primary Care Provider: Shon Baton Other Clinician: ?Referring Provider: ?Treating Provider/Extender: Levi [name] ?Shon Baton ?Weeks in Treatment: 1 ?Verbal / Phone Orders: No ?Diagnosis Coding ?ICD-10 Coding ?Code Description ?I29.798 Non-pressure chronic  ulcer of other part of right lower leg with fat layer exposed ?R29.6 Repeated falls ?G62.9 Polyneuropathy, unspecified ?T38.0X5S Adverse effect of glucocorticoids and synthetic analogues, sequela ?I51.9 Heart disease,

## 2021-08-30 ENCOUNTER — Telehealth: Payer: Self-pay | Admitting: Cardiology

## 2021-08-30 ENCOUNTER — Encounter (HOSPITAL_BASED_OUTPATIENT_CLINIC_OR_DEPARTMENT_OTHER): Payer: Medicare Other | Attending: General Surgery | Admitting: General Surgery

## 2021-08-30 DIAGNOSIS — I4891 Unspecified atrial fibrillation: Secondary | ICD-10-CM | POA: Diagnosis not present

## 2021-08-30 DIAGNOSIS — G629 Polyneuropathy, unspecified: Secondary | ICD-10-CM | POA: Diagnosis not present

## 2021-08-30 DIAGNOSIS — M48062 Spinal stenosis, lumbar region with neurogenic claudication: Secondary | ICD-10-CM | POA: Diagnosis not present

## 2021-08-30 DIAGNOSIS — J449 Chronic obstructive pulmonary disease, unspecified: Secondary | ICD-10-CM | POA: Diagnosis not present

## 2021-08-30 DIAGNOSIS — I509 Heart failure, unspecified: Secondary | ICD-10-CM | POA: Diagnosis not present

## 2021-08-30 DIAGNOSIS — I110 Hypertensive heart disease with heart failure: Secondary | ICD-10-CM | POA: Insufficient documentation

## 2021-08-30 DIAGNOSIS — Z9181 History of falling: Secondary | ICD-10-CM | POA: Diagnosis not present

## 2021-08-30 DIAGNOSIS — L97812 Non-pressure chronic ulcer of other part of right lower leg with fat layer exposed: Secondary | ICD-10-CM | POA: Insufficient documentation

## 2021-08-30 NOTE — Telephone Encounter (Signed)
Per Dr Marlou Porch 08/08/2021 OV note: ? ?Lower extremity edema ?Currently taking Lasix 20 mg as needed.  We will increase his Lasix to 40 mg twice a day for 3 days and then 40 mg once a day thereafter.  ?

## 2021-08-30 NOTE — Telephone Encounter (Signed)
Spoke with patient and reviewed orders.  He states understanding and thanked me for the call. ?

## 2021-08-30 NOTE — Telephone Encounter (Signed)
Pt c/o medication issue: ? ?1. Name of Medication: furosemide (LASIX) 40 MG tablet ? ?2. How are you currently taking this medication (dosage and times per day)? As directed at office visit 08/08/21 ? ?3. Are you having a reaction (difficulty breathing--STAT)?  ? ?4. What is your medication issue? Patient wanted to know how long he is to take the increased dose of the medication  ?

## 2021-08-31 NOTE — Progress Notes (Signed)
KAELEN, CAUGHLIN ([id number]) ?Visit Report for 08/30/2021 ?Arrival Information Details ?Patient Name: Date of Service: ?Chaya Jan HN A. 08/30/2021 2:30 PM ?Medical Record Number: [id number] ?Patient Account Number: [id number] ?Date of Birth/Sex: Treating RN: ?21-Apr-1946 (76 y.o. [name] ?Primary Care Jobani Sabado: Shon Baton Other Clinician: ?Referring Phillip Sandler: ?Treating Jaylan Hinojosa/Extender: Fredirick Maudlin ?Shon Baton ?Weeks in Treatment: 2 ?Visit Information History Since Last Visit ?Added or deleted any medications: No ?Patient Arrived: Gilford Rile ?Any new allergies or adverse reactions: No ?Arrival Time: 15:04 ?Had a fall or experienced change in No ?Accompanied By: wife ?activities of daily living that may affect ?Transfer Assistance: None ?risk of falls: ?Patient Identification Verified: Yes ?Signs or symptoms of abuse/neglect since last visito No ?Secondary Verification Process Completed: Yes ?Hospitalized since last visit: No ?Patient Requires Transmission-Based Precautions: No ?Implantable device outside of the clinic excluding No ?Patient Has Alerts: Yes ?cellular tissue based products placed in the center ?Patient Alerts: Patient on Blood Thinner since last visit: ?R ABI N/C Has Dressing in Place as Prescribed: Yes ?Pain Present Now: No ?Electronic Signature(s) ?Signed: 08/30/2021 4:07:00 PM By: Sandre Kitty ?Entered By: Sandre Kitty on 08/30/2021 15:06:17 ?-------------------------------------------------------------------------------- ?Compression Therapy Details ?Patient Name: Date of Service: ?Chaya Jan HN A. 08/30/2021 2:30 PM ?Medical Record Number: [id number] ?Patient Account Number: [id number] ?Date of Birth/Sex: Treating RN: ?26-Sep-1945 (76 y.o. Jerilynn Mages) Dellie Catholic ?Primary Care Makiyah Zentz: Shon Baton Other Clinician: ?Referring Aigner Horseman: ?Treating Teghan Philbin/Extender: Fredirick Maudlin ?Shon Baton ?Weeks in Treatment: 2 ?Compression Therapy Performed for Wound Assessment: Wound #1  Right,Proximal,Anterior Lower Leg ?Performed By: Clinician Dellie Catholic, RN ?Compression Type: Three Layer ?Post Procedure Diagnosis ?Same as Pre-procedure ?Electronic Signature(s) ?Signed: 08/31/2021 7:31:09 AM By: Dellie Catholic RN ?Entered By: Dellie Catholic on 08/30/2021 17:17:49 ?-------------------------------------------------------------------------------- ?Compression Therapy Details ?Patient Name: ?Date of Service: ?Chaya Jan HN A. 08/30/2021 2:30 PM ?Medical Record Number: [id number] ?Patient Account Number: [id number] ?Date of Birth/Sex: ?Treating RN: ?November 19, 1945 (76 y.o. Jerilynn Mages) Dellie Catholic ?Primary Care Mamie Hundertmark: Shon Baton ?Other Clinician: ?Referring Lewis Grivas: ?Treating Johnothan Bascomb/Extender: Fredirick Maudlin ?Shon Baton ?Weeks in Treatment: 2 ?Compression Therapy Performed for Wound Assessment: Wound #2 Right,Distal,Anterior Lower Leg ?Performed By: Clinician Dellie Catholic, RN ?Compression Type: Three Layer ?Post Procedure Diagnosis ?Same as Pre-procedure ?Electronic Signature(s) ?Signed: 08/31/2021 7:31:09 AM By: Dellie Catholic RN ?Entered By: Dellie Catholic on 08/30/2021 17:18:04 ?-------------------------------------------------------------------------------- ?Encounter Discharge Information Details ?Patient Name: ?Date of Service: ?Chaya Jan HN A. 08/30/2021 2:30 PM ?Medical Record Number: [id number] ?Patient Account Number: [id number] ?Date of Birth/Sex: ?Treating RN: ?06-25-1945 (76 y.o. Jerilynn Mages) Dellie Catholic ?Primary Care Camry Theiss: Shon Baton ?Other Clinician: ?Referring Kolbee Bogusz: ?Treating Read Bonelli/Extender: Fredirick Maudlin ?Shon Baton ?Weeks in Treatment: 2 ?Encounter Discharge Information Items Post Procedure Vitals ?Discharge Condition: Stable ?Temperature (F): 98 ?Ambulatory Status: [name] ?Pulse (bpm): 96 ?Discharge Destination: Home ?Respiratory Rate (breaths/min): 18 ?Transportation: Private Auto ?Blood Pressure (mmHg): 136/76 ?Accompanied By: spouse ?Schedule Follow-up Appointment:  Yes ?Clinical Summary of Care: Patient Declined ?Electronic Signature(s) ?Signed: 08/31/2021 7:31:09 AM By: Dellie Catholic RN ?Entered By: Dellie Catholic on 08/31/2021 07:29:51 ?-------------------------------------------------------------------------------- ?Lower Extremity Assessment Details ?Patient Name: ?Date of Service: ?Chaya Jan HN A. 08/30/2021 2:30 PM ?Medical Record Number: [id number] ?Patient Account Number: [id number] ?Date of Birth/Sex: ?Treating RN: ?Apr 01, 1946 (76 y.o. Jerilynn Mages) Dellie Catholic ?Primary Care Jasir Rother: Shon Baton ?Other Clinician: ?Referring Candence Sease: ?Treating Irma Roulhac/Extender: Fredirick Maudlin ?Shon Baton ?Weeks in Treatment: 2 ?Edema Assessment ?Assessed: [Left: No] [Right: No] ?Edema: [Left: Ye] [Right: s] ?Calf ?Left: Right: ?Point of Measurement: From Medial Instep 33.1 cm ?Ankle ?Left: Right: ?Point of Measurement: From Medial Instep  22.2 cm ?Electronic Signature(s) ?Signed: 08/31/2021 7:31:09 AM By: Dellie Catholic RN ?Entered By: Dellie Catholic on 08/30/2021 15:20:33 ?-------------------------------------------------------------------------------- ?Multi Wound Chart Details ?Patient Name: ?Date of Service: ?Chaya Jan HN A. 08/30/2021 2:30 PM ?Medical Record Number: [id number] ?Patient Account Number: [id number] ?Date of Birth/Sex: ?Treating RN: ?10-08-1945 (76 y.o. [name] ?Primary Care Vara Mairena: Shon Baton ?Other Clinician: ?Referring Watt Geiler: ?Treating Kamarah Bilotta/Extender: Fredirick Maudlin ?Shon Baton ?Weeks in Treatment: 2 ?Vital Signs ?Height(in): 74 ?Pulse(bpm): 96 ?Weight(lbs): 187 ?Blood Pressure(mmHg): 136/76 ?Body Mass Index(BMI): 24 ?Temperature(??F): 98.0 ?Respiratory Rate(breaths/min): 18 ?Photos: [N/A:N/A] ?Right, Proximal, Anterior Lower Leg Right, Distal, Anterior Lower Leg N/A ?Wound Location: ?Trauma Trauma N/A ?Wounding Event: ?Abrasion Abrasion N/A ?Primary Etiology: ?Venous Leg Ulcer Venous Leg Ulcer N/A ?Secondary Etiology: ?Cataracts, Chronic sinus  Cataracts, Chronic sinus N/A ?Comorbid History: ?problems/congestion, Asthma, Chronic problems/congestion, Asthma, Chronic ?Obstructive Pulmonary Disease Obstructive Pulmonary Disease ?(COPD), Pneumothorax, Arrhythmia, (COPD), Pneumothorax, Arrhythmia, ?Congestive Heart Failure, Congestive Heart Failure, ?Hypertension, Osteoarthritis, Hypertension, Osteoarthritis, ?Neuropathy Neuropathy ?05/18/2021 07/19/2021 N/A ?Date Acquired: ?2 2 N/A ?Weeks of Treatment: ?Open Open N/A ?Wound Status: ?No No N/A ?Wound Recurrence: ?0.6x0.3x0.1 3.2x0.4x0.1 N/A ?Measurements L x W x D (cm) ?0.141 1.005 N/A ?A (cm?) : ?rea ?0.014 0.101 N/A ?Volume (cm?) : ?94.40% 74.70% N/A ?% Reduction in Area: ?94.40% 74.60% N/A ?% Reduction in Volume: ?Full Thickness Without Exposed Full Thickness Without Exposed N/A ?Classification: ?Support Structures Support Structures ?Small Medium N/A ?Exudate Amount: ?Serosanguineous Serosanguineous N/A ?Exudate Type: ?red, brown red, brown N/A ?Exudate Color: ?Distinct, outline attached Distinct, outline attached N/A ?Wound Margin: ?Large (67-100%) Large (67-100%) N/A ?Granulation Amount: ?Red Red N/A ?Granulation Quality: ?Small (1-33%) Small (1-33%) N/A ?Necrotic Amount: ?Fat Layer (Subcutaneous Tissue): Yes Fat Layer (Subcutaneous Tissue): Yes N/A ?Exposed Structures: ?Fascia: No ?Fascia: No ?Tendon: No ?Tendon: No ?Muscle: No ?Muscle: No ?Joint: No ?Joint: No ?Bone: No ?Bone: No ?Medium (34-66%) Small (1-33%) N/A ?Epithelialization: ?Debridement - Excisional Debridement - Excisional N/A ?Debridement: ?Pre-procedure Verification/Time Out 15:32 15:32 N/A ?Taken: ?Lidocaine 5% topical ointment Lidocaine 5% topical ointment N/A ?Pain Control: ?Subcutaneous, Slough Subcutaneous, Slough N/A ?Tissue Debrided: ?Skin/Subcutaneous Tissue Skin/Subcutaneous Tissue N/A ?Level: ?0.18 1.28 N/A ?Debridement A (sq cm): ?rea ?Curette Curette N/A ?Instrument: ?Minimum Minimum N/A ?Bleeding: ?Pressure Pressure  N/A ?Hemostasis A chieved: ?0 0 N/A ?Procedural Pain: ?0 0 N/A ?Post Procedural Pain: ?Procedure was tolerated well Procedure was tolerated well N/A ?Debridement Treatment Response: ?0.6x0.3x0.1 3.2x0.4x0.1 N/A ?Post Debridement Measurements L x

## 2021-08-31 NOTE — Progress Notes (Signed)
Levi [name], Levi [name] ([id number]) ?Visit Report for 08/30/2021 ?Chief Complaint Document Details ?Patient Name: Date of Service: ?Levi Jan HN A. 08/30/2021 2:30 PM ?Medical Record Number: [id number] ?Patient Account Number: [id number] ?Date of Birth/Sex: Treating RN: ?1945/08/24 (76 y.o. [name] ?Primary Care Provider: Shon Baton Other Clinician: ?Referring Provider: ?Treating Provider/Extender: Fredirick Maudlin ?Shon Baton ?Weeks in Treatment: 2 ?Information Obtained from: Patient ?Chief Complaint ?Patient seen for complaints of Non-Healing Wound (x2). ?Electronic Signature(s) ?Signed: 08/30/2021 3:45:25 PM By: Fredirick Maudlin MD FACS ?Entered By: Fredirick Maudlin on 08/30/2021 15:45:24 ?-------------------------------------------------------------------------------- ?Debridement Details ?Patient Name: Date of Service: ?Levi Jan HN A. 08/30/2021 2:30 PM ?Medical Record Number: [id number] ?Patient Account Number: [id number] ?Date of Birth/Sex: Treating RN: ?1945/09/12 (76 y.o. Jerilynn Mages) Dellie Catholic ?Primary Care Provider: Shon Baton Other Clinician: ?Referring Provider: ?Treating Provider/Extender: Fredirick Maudlin ?Shon Baton ?Weeks in Treatment: 2 ?Debridement Performed for Assessment: Wound #1 Right,Proximal,Anterior Lower Leg ?Performed By: Physician Fredirick Maudlin, MD ?Debridement Type: Debridement ?Severity of Tissue Pre Debridement: Fat layer exposed ?Level of Consciousness (Pre-procedure): Awake and Alert ?Pre-procedure Verification/Time Out Yes - 15:32 ?Taken: ?Start Time: 15:32 ?Pain Control: Lidocaine 5% topical ointment ?T Area Debrided (L x W): ?otal 0.6 (cm) x 0.3 (cm) = 0.18 (cm?) ?Tissue and other material debrided: Non-Viable, Slough, Subcutaneous, [place] ?Level: Skin/Subcutaneous Tissue ?Debridement Description: Excisional ?Instrument: Curette ?Bleeding: Minimum ?Hemostasis Achieved: Pressure ?End Time: 15:34 ?Procedural Pain: 0 ?Post Procedural Pain: 0 ?Response to Treatment: Procedure was  tolerated well ?Level of Consciousness (Post- Awake and Alert ?procedure): ?Post Debridement Measurements of Total Wound ?Length: (cm) 0.6 ?Width: (cm) 0.3 ?Depth: (cm) 0.1 ?Volume: (cm?) 0.014 ?Character of Wound/Ulcer Post Debridement: Improved ?Severity of Tissue Post Debridement: Fat layer exposed ?Post Procedure Diagnosis ?Same as Pre-procedure ?Electronic Signature(s) ?Signed: 08/30/2021 5:21:29 PM By: Fredirick Maudlin MD FACS ?Signed: 08/31/2021 7:31:09 AM By: Dellie Catholic RN ?Entered By: Dellie Catholic on 08/30/2021 15:40:33 ?-------------------------------------------------------------------------------- ?Debridement Details ?Patient Name: ?Date of Service: ?Levi Jan HN A. 08/30/2021 2:30 PM ?Medical Record Number: [id number] ?Patient Account Number: [id number] ?Date of Birth/Sex: ?Treating RN: ?07/18/1945 (76 y.o. Jerilynn Mages) Dellie Catholic ?Primary Care Provider: Shon Baton ?Other Clinician: ?Referring Provider: ?Treating Provider/Extender: Fredirick Maudlin ?Shon Baton ?Weeks in Treatment: 2 ?Debridement Performed for Assessment: Wound #2 Right,Distal,Anterior Lower Leg ?Performed By: Physician Fredirick Maudlin, MD ?Debridement Type: Debridement ?Severity of Tissue Pre Debridement: Fat layer exposed ?Level of Consciousness (Pre-procedure): Awake and Alert ?Pre-procedure Verification/Time Out Yes - 15:32 ?Taken: ?Start Time: 15:32 ?Pain Control: Lidocaine 5% topical ointment ?T Area Debrided (L x W): ?otal 3.2 (cm) x 0.4 (cm) = 1.28 (cm?) ?Tissue and other material debrided: Non-Viable, Slough, Subcutaneous, [place] ?Level: Skin/Subcutaneous Tissue ?Debridement Description: Excisional ?Instrument: Curette ?Bleeding: Minimum ?Hemostasis Achieved: Pressure ?End Time: 15:34 ?Procedural Pain: 0 ?Post Procedural Pain: 0 ?Response to Treatment: Procedure was tolerated well ?Level of Consciousness (Post- Awake and Alert ?procedure): ?Post Debridement Measurements of Total Wound ?Length: (cm) 3.2 ?Width: (cm) 0.4 ?Depth:  (cm) 0.1 ?Volume: (cm?) 0.101 ?Character of Wound/Ulcer Post Debridement: Improved ?Severity of Tissue Post Debridement: Fat layer exposed ?Post Procedure Diagnosis ?Same as Pre-procedure ?Electronic Signature(s) ?Signed: 08/30/2021 5:21:29 PM By: Fredirick Maudlin MD FACS ?Signed: 08/31/2021 7:31:09 AM By: Dellie Catholic RN ?Entered By: Dellie Catholic on 08/30/2021 15:45:07 ?-------------------------------------------------------------------------------- ?HPI Details ?Patient Name: Date of Service: ?Levi Jan HN A. 08/30/2021 2:30 PM ?Medical Record Number: [id number] ?Patient Account Number: [id number] ?Date of Birth/Sex: Treating RN: ?[birth year] (76 y.o. [name] ?Primary Care Provider: Shon Baton Other Clinician: ?Referring Provider: ?Treating Provider/Extender: Fredirick Maudlin ?Virgina Jock,  Khyron ?Weeks in Treatment: 2 ?History of Present Illness ?HPI Description: ADMISSION ?08/16/2021 ?This is a 76 year old man with a history of multiple falls here for evaluation of 2 traumatic wounds to his right lower extremity. He had a fall in December which ?resulted in the laceration that is proximal and a fall in February that opened up the distal wound. His falls are secondary to significant idiopathic peripheral ?neuropathy; he just cannot tell where his feet are sometimes. He has also had multiple orthopedic surgeries including on his back for neurogenic claudication. ?He does not have diabetes. He does have COPD and is currently taking 20 mg of prednisone daily. He is on Eliquis for atrial fibrillation. At 1 point he did take ?doxycycline for the wounds and since, they have been applying bacitracin. He does have compression stockings which he says he wears, but he is not ?wearing them in clinic today. He is accompanied by his wife. ?On the anterior tibial surface of his right leg, there are 2 vertical wounds consistent with traumatic lacerations. The periwound skin is intact and there is no ?erythema induration or  purulent drainage present. Both have significant slough and wound debris. ABIs in clinic were noncompressible. ?08/23/2021: Today, both wounds have some accumulated slough, but the more proximal one has partially closed leaving 2 separate small openings. The ?periwound is intact. Edema control is excellent. ?07/30/2021: The proximal wound is almost completely closed with a small opening remaining. Minimal slough in the wound base. The distal wound has filled in with ?good granulation tissue and continues to contract. Minimal slough present. Edema control is excellent. ?Electronic Signature(s) ?Signed: 08/30/2021 3:48:41 PM By: Fredirick Maudlin MD FACS ?Entered By: Fredirick Maudlin on 08/30/2021 15:48:41 ?-------------------------------------------------------------------------------- ?Physical Exam Details ?Patient Name: Date of Service: ?Levi Jan HN A. 08/30/2021 2:30 PM ?Medical Record Number: [id number] ?Patient Account Number: [id number] ?Date of Birth/Sex: Treating RN: ?April 14, 1946 (76 y.o. [name] ?Primary Care Provider: Shon Baton Other Clinician: ?Referring Provider: ?Treating Provider/Extender: Fredirick Maudlin ?Shon Baton ?Weeks in Treatment: 2 ?Constitutional ?. . . . No acute distress. ?Cardiovascular ?3+ pitting edema in his left leg.[name] ?Notes ?08/30/2021: The proximal wound is almost completely closed with just a small opening. The distal wound is contracting and filling with good granulation tissue. ?Both have minimal slough. Edema control on the right is excellent. ?Electronic Signature(s) ?Signed: 08/30/2021 3:49:55 PM By: Fredirick Maudlin MD FACS ?Entered By: Fredirick Maudlin on 08/30/2021 15:49:55 ?-------------------------------------------------------------------------------- ?Physician Orders Details ?Patient Name: Date of Service: ?Levi Jan HN A. 08/30/2021 2:30 PM ?Medical Record Number: [id number] ?Patient Account Number: [id number] ?Date of Birth/Sex: ?Treating RN: ?1945-09-07 (76 y.o. Jerilynn Mages)  Dellie Catholic ?Primary Care Provider: Shon Baton ?Other Clinician: ?Referring Provider: ?Treating Provider/Extender: Fredirick Maudlin ?Shon Baton ?Weeks in Treatment: 2 ?Verbal / Phone Orders: No ?Diagnosis Coding ?

## 2021-09-04 ENCOUNTER — Encounter: Payer: Self-pay | Admitting: Neurology

## 2021-09-04 ENCOUNTER — Ambulatory Visit (INDEPENDENT_AMBULATORY_CARE_PROVIDER_SITE_OTHER): Payer: Medicare Other | Admitting: Neurology

## 2021-09-04 VITALS — BP 122/74 | HR 46 | Ht 74.0 in | Wt 189.2 lb

## 2021-09-04 DIAGNOSIS — R296 Repeated falls: Secondary | ICD-10-CM

## 2021-09-04 DIAGNOSIS — R2689 Other abnormalities of gait and mobility: Secondary | ICD-10-CM | POA: Diagnosis not present

## 2021-09-04 DIAGNOSIS — I2510 Atherosclerotic heart disease of native coronary artery without angina pectoris: Secondary | ICD-10-CM | POA: Diagnosis not present

## 2021-09-04 DIAGNOSIS — G629 Polyneuropathy, unspecified: Secondary | ICD-10-CM | POA: Diagnosis not present

## 2021-09-04 DIAGNOSIS — G250 Essential tremor: Secondary | ICD-10-CM

## 2021-09-04 MED ORDER — DULOXETINE HCL 30 MG PO CPEP: 30.0000 mg | ORAL_CAPSULE | Freq: Every day | ORAL | 5 refills | Status: DC

## 2021-09-04 NOTE — Progress Notes (Addendum)
Subjective:  ?  ?Patient ID: EUSEVIO SCHRIVER is a 76 y.o. male. ? ?HPI ? ? ? ?Interim history:  ? ?Mr. Kokesh is a 76 year old right-handed gentleman with an underlying  Complex medical history of asthma, A. fib, hyperlipidemia, reflux disease with hiatal hernia, diverticulitis, degenerative disc disease, COPD, allergic rhinitis, history of agent orange exposure in the 70s, carpal tunnel syndrome, status post bilateral carpal tunnel release and ulnar release surgeries in January and February of this year, status post multiple surgeries including lumbar spine surgery, neck surgery, bilateral hip replacements, bilateral knee replacements, shoulder surgeries, who presents for follow-up consultation of his neuropathy. I last saw him on 09/20/2020, at which time he reported ongoing leg pain, right side more than left.  He had fallen.  He had presented to the emergency room due to presyncopal symptoms.  He was advised to start gabapentin low-dose with gradual increase.  He was advised to use his walker rather than his cane. ? ?He saw Debbora Presto, NP in the interim on 12/20/2020, at which time he was on gabapentin 300 mg at bedtime.  He was advised to increase his gabapentin gradually to 300 mg 3 times daily.  He was also given a prescription for a compounded cream for neuropathy. ? ?He saw Debbora Presto, NP on 04/09/2021, at which time he reported that he had stopped taking gabapentin as it was not effective.  He did not use the compounded cream.  He declined a second prescription to retry it.  He reported blood pressure fluctuation.  He was advised to start low-dose Cymbalta at 30 mg daily.  The Cymbalta was increased to 60 mg daily in December 2022. ? ?Today, 09/04/2021: He reports having had several more falls, I reviewed his chart, he has had multiple emergency room visits since December 2022.  He broke his ribs on the right side, he had sustained a skin ulcer on the right leg and it became infected, he is now under wound care.   He was supposed to have a fundoplication done with general surgery but this was indefinitely postponed as I understand because of his high risk for bleeding, he is on Eliquis, and his high risk for falls.  He was supposed to have the Watchman procedure but it was aborted.  He is followed closely by cardiology for his congestive heart failure.  He has had significant lower extremity swelling.  He was using compression socks, his right leg is bandaged.  As far as the pain and numbness, he has discomfort but mostly numbness in his legs, it is up to his knees.  Sometimes he feels that his legs will give out.  He has not used his walker consistently but mostly uses his four-point cane.  He is again advised to use his walker at all times.  He tolerates the Cymbalta at 60 mg strength but feels that the 30 was more effective.  He is wondering if he should go back to 30 mg.  He believes he is taking the generic duloxetine for the 60 but may have had the brand name for the 30 mg.  For his chronic lung disease he has been on prednisone lately, it was at 40 mg daily but he gained a lot of weight in the short period of time.  He is now on 20 mg daily. ?  ?Previously:  ? ?I saw him at the request of [name] on 10/21/2019, at which time the patient reported a longstanding history of numbness and  tingling and pain in both lower extremities.  Symptoms were more on the right. ?  ?He had extensive blood work on 10/21/2019.  A1c was 5.5, ANA was negative, CRP was less than 1, rheumatoid factor was less than 10, CK was 59, heavy metals in the blood was negative, CMP showed glucose of 115, BUN 27, creatinine 0.88, AST 19, ALT 15, TSH was mildly elevated at 5.33, multiple myeloma panel showed decreased in immunoglobulins with a pattern suggestive of hypogammaglobulinemia, RPR nonreactive, vitamin B12 low at 224, thiamine 206.2.  He was advised to seek consultation with hematology.  He was furthermore advised to follow-up with his primary  care for B12 deficiency and abnormal TSH. ?  ?He had an EMG and nerve conduction velocity test through our office on 11/17/2019 and I reviewed the results:   ?Conclusion: ?This is an abnormal study.  There is electrodiagnostic evidence of mild bilateral lumbosacral radiculopathy, mainly involving bilateral L4, L5, S1 myotomes, there is no evidence of active process.  In addition, there is evidence of mild length dependent axonal sensorimotor polyneuropathy. ?  ?We called him with his test results.  He saw hematology in July 2021, hypogammaglobulinemia was not considered to be significant at the time.  He did not have any history of recurrent infections.  He was not felt to need a bone marrow biopsy. ?  ?  ?10/21/19: (He) reports a longerstanding Hx of Numbness and tingling as well as pain in both lower extremities, right more than left as well as some numbness and tingling in both hands and forearms.  He is unsure as to when his symptoms started, probably several years ago.  He has had falls.  He has felt imbalanced.  He has started using a cane but did not bring it today.  He denies a family history of neuropathy or hereditary muscle diseases, is not diabetic.  He does not drink alcohol and has not had any alcohol in years, probably 40 years. He is a non-smoker.  He tried gabapentin some time ago for his back, he is unsure if it helped but does not recall having any side effects.  He reports that he had shingles in the past and this affected his left-sided truncal area and he tried Lyrica for it which did not help.  He tries to hydrate well but does report mouth dryness. ?He had recent EMG nerve conduction velocity testing through your office on 09/27/2019, supportive of sensorimotor peripheral neuropathy with some superimposed residuals from prior lumbar radiculopathy contributing to the EMG findings. ?  ?He had a CT myelogram of the spine on 05/12/19, and I reviewed the results: IMPRESSION: ?Cervical spine: ?  ?1.  C4-C6 ACDF with solid arthrodesis and patent canal/foramina. ?2. C6-7 interval disc arthroplasty with patent canal and foramina. ?3. C3-4 chronic right foraminal impingement mainly from ?uncovertebral spurring. ?  ?Thoracic spine: ?  ?1. T2-3 focal advanced disc degeneration with height loss causing ?biforaminal impingement. ?2. Spondylosis and disc fissuring at T7-8 below. ?3. Diffusely patent spinal canal. ?  ?Lumbar spine: ?  ?1. T12-S1 fusion. Notable vacuum phenomenon at the L4-5 disc space ?with right S1 pedicle screw fracture, but definite solid ?posterior-lateral arthrodesis at L4-5. Solid arthrodesis is also ?seen at the other levels. ?2. L4-5 subarticular recess narrowing on the right more than left ?with overall mild central stenosis. ?Previously: ?  ?04/21/2018: ?  ?76 year old right-handed gentleman with an underlying complex medical history of paroxysmal A. fib, COPD, reflux disease, hyperlipidemia, allergic rhinitis, coronary  artery disease, gout, degenerative joint disease, anxiety, history of spinal stenosis, status post multiple surgeries including bilateral knee surgeries, foot surgeries, tonsillectomy, cholecystectomy, back surgeries, shoulder surgeries, neck surgery, and status post total hip replacement b/l, hardware in the back, who reports a sense of dizziness and intermittent nausea for the past 2-3 months. He reports intermittent vertiginous symptoms when laying in bed but has had lightheadedness when sitting and standing. He has had multiple falls, he has significant mobility issues, multiple joint related and back surgeries. He has significant degenerative disease of the entire spine, sees [name].  ?I reviewed your office note from 04/10/2018, which you kindly included. He was treated for vertigo symptoms with meclizine 12.5 mg 3 times a day for about 10 days. His wife has noticed a tremor. He has a family history of Parkinson's disease on his maternal side, 2 aunts, as he recalls, no  family history of essential tremor. He tried meclizine but did not find it helpful. Of note, he is on a nebulizer as needed as well as albuterol as needed. He is on chronic prednisone, currently 10 m

## 2021-09-04 NOTE — Patient Instructions (Signed)
We will go back down to 30 mg with your Cymbalta, since you felt if was more effective. It is possible that you received the brand name and now you are on the generic duloxetine 60 mg.  ?Please use your walker at all times.  ?

## 2021-09-05 ENCOUNTER — Telehealth: Payer: Self-pay | Admitting: Cardiology

## 2021-09-05 MED ORDER — POTASSIUM CHLORIDE CRYS ER 20 MEQ PO TBCR: 20.0000 meq | EXTENDED_RELEASE_TABLET | Freq: Every day | ORAL | 3 refills | Status: DC

## 2021-09-05 NOTE — Telephone Encounter (Signed)
The patient is running out of current supply of potassium 20 meq daily that has been historically filled by Dr. Virgina Jock.  Has been taking one daily.  Recent labs w potassium at 4.6.  taking 40 mg lasix daily.  Refilled potassium from [name].    ?

## 2021-09-05 NOTE — Telephone Encounter (Signed)
?  Patient states that at last visit Dr Marlou Porch was supposed to send a prescription in for him for Potassium but it was never sent. Does Dr Marlou Porch still want him to take it? If so, please send [id number]- [place],  - [phone] AT [id number] ?

## 2021-09-06 ENCOUNTER — Encounter (HOSPITAL_BASED_OUTPATIENT_CLINIC_OR_DEPARTMENT_OTHER): Payer: Medicare Other | Admitting: General Surgery

## 2021-09-06 DIAGNOSIS — G629 Polyneuropathy, unspecified: Secondary | ICD-10-CM | POA: Diagnosis not present

## 2021-09-06 DIAGNOSIS — J449 Chronic obstructive pulmonary disease, unspecified: Secondary | ICD-10-CM | POA: Diagnosis not present

## 2021-09-06 DIAGNOSIS — M48062 Spinal stenosis, lumbar region with neurogenic claudication: Secondary | ICD-10-CM | POA: Diagnosis not present

## 2021-09-06 DIAGNOSIS — L97812 Non-pressure chronic ulcer of other part of right lower leg with fat layer exposed: Secondary | ICD-10-CM | POA: Diagnosis not present

## 2021-09-06 DIAGNOSIS — I4891 Unspecified atrial fibrillation: Secondary | ICD-10-CM | POA: Diagnosis not present

## 2021-09-06 DIAGNOSIS — I110 Hypertensive heart disease with heart failure: Secondary | ICD-10-CM | POA: Diagnosis not present

## 2021-09-06 NOTE — Progress Notes (Signed)
SILER, MAVIS ([id number]) ?Visit Report for 09/06/2021 ?Arrival Information Details ?Patient Name: Date of Service: ?Chaya Jan HN A. 09/06/2021 10:30 A M ?Medical Record Number: [id number] ?Patient Account Number: [id number] ?Date of Birth/Sex: Treating RN: ?1946-05-26 (76 y.o. M) ?Primary Care Shamina Etheridge: Shon Baton Other Clinician: ?Referring Saturnino Liew: ?Treating Media Pizzini/Extender: Fredirick Maudlin ?Shon Baton ?Weeks in Treatment: 3 ?Visit Information History Since Last Visit ?Added or deleted any medications: No ?Patient Arrived: [name] ?Any new allergies or adverse reactions: No ?Arrival Time: 10:26 ?Had a fall or experienced change in No ?Accompanied By: self ?activities of daily living that may affect ?Transfer Assistance: None ?risk of falls: ?Patient Identification Verified: Yes ?Signs or symptoms of abuse/neglect since last visito No ?Secondary Verification Process Completed: Yes ?Hospitalized since last visit: No ?Patient Requires Transmission-Based Precautions: No ?Implantable device outside of the clinic excluding No ?Patient Has Alerts: Yes ?cellular tissue based products placed in the center ?Patient Alerts: Patient on Blood Thinner since last visit: ?R ABI N/C Has Dressing in Place as Prescribed: Yes ?Pain Present Now: No ?Electronic Signature(s) ?Signed: 09/06/2021 10:45:31 AM By: Sandre Kitty ?Entered By: Sandre Kitty on 09/06/2021 10:27:43 ?-------------------------------------------------------------------------------- ?Compression Therapy Details ?Patient Name: Date of Service: ?Chaya Jan HN A. 09/06/2021 10:30 A M ?Medical Record Number: [id number] ?Patient Account Number: [id number] ?Date of Birth/Sex: Treating RN: ?[birth year] (76 y.o. M) ?Primary Care Debbora Ang: Shon Baton Other Clinician: ?Referring Falon Flinchum: ?Treating Efosa Treichler/Extender: Fredirick Maudlin ?Shon Baton ?Weeks in Treatment: 3 ?Compression Therapy Performed for Wound Assessment: Wound #2 Right,Distal,Anterior Lower Leg ?Performed  By: Clinician Baruch Gouty, RN ?Compression Type: Three Layer ?Post Procedure Diagnosis ?Same as Pre-procedure ?Electronic Signature(s) ?Signed: 09/06/2021 3:53:13 PM By: Adline Peals ?Entered By: Adline Peals on 09/06/2021 13:44:38 ?-------------------------------------------------------------------------------- ?Encounter Discharge Information Details ?Patient Name: ?Date of Service: ?Chaya Jan HN A. 09/06/2021 10:30 A M ?Medical Record Number: [id number] ?Patient Account Number: [id number] ?Date of Birth/Sex: ?Treating RN: ?05-Feb-1946 (76 y.o. Jerilynn Mages) Dellie Catholic ?Primary Care Ari Bernabei: Shon Baton ?Other Clinician: ?Referring Ajayla Iglesias: ?Treating Jaclyne Haverstick/Extender: Fredirick Maudlin ?Shon Baton ?Weeks in Treatment: 3 ?Encounter Discharge Information Items ?Discharge Condition: Stable ?Ambulatory Status: Ambulatory ?Discharge Destination: Home ?Transportation: Private Auto ?Accompanied By: spouse ?Schedule Follow-up Appointment: Yes ?Clinical Summary of Care: Patient Declined ?Electronic Signature(s) ?Signed: 09/06/2021 4:28:10 PM By: Dellie Catholic RN ?Entered By: Dellie Catholic on 09/06/2021 16:27:45 ?-------------------------------------------------------------------------------- ?Lower Extremity Assessment Details ?Patient Name: ?Date of Service: ?Chaya Jan HN A. 09/06/2021 10:30 A M ?Medical Record Number: [id number] ?Patient Account Number: [id number] ?Date of Birth/Sex: ?Treating RN: ?1945-06-10 (76 y.o. M) ?Primary Care Fe Okubo: Shon Baton ?Other Clinician: ?Referring Mikias Lanz: ?Treating Jamaul Heist/Extender: Fredirick Maudlin ?Shon Baton ?Weeks in Treatment: 3 ?Edema Assessment ?Assessed: [Left: No] [Right: No] ?Edema: [Left: Ye] [Right: s] ?Calf ?Left: Right: ?Point of Measurement: From Medial Instep 35.4 cm ?Ankle ?Left: Right: ?Point of Measurement: From Medial Instep 22.5 cm ?Vascular Assessment ?Pulses: ?Dorsalis Pedis ?Palpable: [Right:Yes] ?Electronic Signature(s) ?Signed: 09/06/2021 3:53:13  PM By: Adline Peals ?Entered By: Adline Peals on 09/06/2021 10:48:44 ?-------------------------------------------------------------------------------- ?Multi Wound Chart Details ?Patient Name: ?Date of Service: ?Chaya Jan HN A. 09/06/2021 10:30 A M ?Medical Record Number: [id number] ?Patient Account Number: [id number] ?Date of Birth/Sex: ?Treating RN: ?06-27-1945 (76 y.o. M) ?Primary Care Santhosh Gulino: Shon Baton ?Other Clinician: ?Referring Tenessa Marsee: ?Treating Evee Liska/Extender: Fredirick Maudlin ?Shon Baton ?Weeks in Treatment: 3 ?Vital Signs ?Height(in): 74 ?Pulse(bpm): 82 ?Weight(lbs): 187 ?Blood Pressure(mmHg): 120/76 ?Body Mass Index(BMI): 24 ?Temperature(??F): 97.7 ?Respiratory Rate(breaths/min): 18 ?Photos: [N/A:N/A] ?Right, Proximal, Anterior Lower Leg Right, Distal, Anterior Lower Leg N/A ?Wound Location: ?Trauma Trauma N/A ?Wounding  Event: ?Abrasion Abrasion N/A ?Primary Etiology: ?Venous Leg Ulcer Venous Leg Ulcer N/A ?Secondary Etiology: ?Cataracts, Chronic sinus Cataracts, Chronic sinus N/A ?Comorbid History: ?problems/congestion, Asthma, Chronic problems/congestion, Asthma, Chronic ?Obstructive Pulmonary Disease Obstructive Pulmonary Disease ?(COPD), Pneumothorax, Arrhythmia, (COPD), Pneumothorax, Arrhythmia, ?Congestive Heart Failure, Congestive Heart Failure, ?Hypertension, Osteoarthritis, Hypertension, Osteoarthritis, ?Neuropathy Neuropathy ?05/18/2021 07/19/2021 N/A ?Date Acquired: ?3 3 N/A ?Weeks of Treatment: ?Healed - Epithelialized Open N/A ?Wound Status: ?No No N/A ?Wound Recurrence: ?0x0x0 2x0.4x0.1 N/A ?Measurements L x W x D (cm) ?0 0.628 N/A ?A (cm?) : ?rea ?0 0.063 N/A ?Volume (cm?) : ?100.00% 84.20% N/A ?% Reduction in Area: ?100.00% 84.10% N/A ?% Reduction in Volume: ?Full Thickness Without Exposed Full Thickness Without Exposed N/A ?Classification: ?Support Structures Support Structures ?None Present Medium N/A ?Exudate Amount: ?N/A Serosanguineous N/A ?Exudate Type: ?N/A red,  brown N/A ?Exudate Color: ?Distinct, outline attached Distinct, outline attached N/A ?Wound Margin: ?None Present (0%) Large (67-100%) N/A ?Granulation Amount: ?N/A Red N/A ?Granulation Quality: ?None Present (0%) None Present (0%) N/A ?Necrotic Amount: ?Fascia: No ?Fat Layer (Subcutaneous Tissue): Yes N/A ?Exposed Structures: ?Fat Layer (Subcutaneous Tissue): No Fascia: No ?Tendon: No ?Tendon: No ?Muscle: No ?Muscle: No ?Joint: No ?Joint: No ?Bone: No ?Bone: No ?Large (67-100%) Medium (34-66%) N/A ?Epithelialization: ?Treatment Notes ?Electronic Signature(s) ?Signed: 09/06/2021 11:41:48 AM By: Fredirick Maudlin MD FACS ?Entered By: Fredirick Maudlin on 09/06/2021 11:41:48 ?-------------------------------------------------------------------------------- ?Multi-Disciplinary Care Plan Details ?Patient Name: ?Date of Service: ?Chaya Jan HN A. 09/06/2021 10:30 A M ?Medical Record Number: [id number] ?Patient Account Number: [id number] ?Date of Birth/Sex: ?Treating RN: ?1945-07-25 (76 y.o. M) ?Primary Care Ilay Capshaw: Shon Baton ?Other Clinician: ?Referring Alyaan Budzynski: ?Treating Hoang Reich/Extender: Fredirick Maudlin ?Shon Baton ?Weeks in Treatment: 3 ?Multidisciplinary Care Plan reviewed with physician ?Active Inactive ?Abuse / Safety / Falls / Self Care Management ?Nursing Diagnoses: ?History of Falls ?Potential for falls ?Goals: ?Patient will not experience any injury related to falls ?Date Initiated: 08/16/2021 ?Target Resolution Date: 10/04/2021 ?Goal Status: Active ?Patient/caregiver will verbalize/demonstrate measures taken to prevent injury and/or falls ?Date Initiated: 08/16/2021 ?Target Resolution Date: 10/04/2021 ?Goal Status: Active ?Interventions: ?Call light and/or bell within patient's reach ?Assess fall risk on admission and as needed ?Assess impairment of mobility on admission and as needed per policy ?Notes: ?Venous Leg Ulcer ?Nursing Diagnoses: ?Knowledge deficit related to disease process and management ?Potential  for venous Insuffiency (use before diagnosis confirmed) ?Goals: ?Patient will maintain optimal edema control ?Date Initiated: 08/16/2021 ?Target Resolution Date: 10/04/2021 ?Goal Status: Active ?Interventio

## 2021-09-06 NOTE — Progress Notes (Addendum)
Levi, [name] ([id number]) ?Visit Report for 09/06/2021 ?Chief Complaint Document Details ?Patient Name: Date of Service: ?Levi [name]. 09/06/2021 10:30 [name] M ?Medical Record Number: [id number] ?Patient Account Number: [id number] ?Date of Birth/Sex: Treating RN: ?[birth year] (76 y.o. M) ?Primary Care Provider: Shon Baton Other Clinician: ?Referring Provider: ?Treating Provider/Extender: Fredirick Maudlin ?Shon Baton ?Weeks in Treatment: 3 ?Information Obtained from: Patient ?Chief Complaint ?Patient seen for complaints of Non-Healing Wound (x2). ?Electronic Signature(s) ?Signed: 09/06/2021 11:42:02 AM By: Fredirick Maudlin MD FACS ?Entered By: Fredirick Maudlin on 09/06/2021 11:42:02 ?-------------------------------------------------------------------------------- ?HPI Details ?Patient Name: Date of Service: ?Levi [name]. 09/06/2021 10:30 [name] M ?Medical Record Number: [id number] ?Patient Account Number: [id number] ?Date of Birth/Sex: Treating RN: ?02-May-1946 (76 y.o. M) ?Primary Care Provider: Shon Baton Other Clinician: ?Referring Provider: ?Treating Provider/Extender: Fredirick Maudlin ?Shon Baton ?Weeks in Treatment: 3 ?History of Present Illness ?HPI Description: ADMISSION ?08/16/2021 ?This is [name] 76 year old man with [name] history of multiple falls here for evaluation of 2 traumatic wounds to his right lower extremity. He had [name] fall in December which ?resulted in the laceration that is proximal and [name] fall in February that opened up the distal wound. His falls are secondary to significant idiopathic peripheral ?neuropathy; he just cannot tell where his feet are sometimes. He has also had multiple orthopedic surgeries including on his back for neurogenic claudication. ?He does not have diabetes. He does have COPD and is currently taking 20 mg of prednisone daily. He is on Eliquis for atrial fibrillation. At 1 point he did take ?doxycycline for the wounds and since, they have been applying bacitracin. He does have compression  stockings which he says he wears, but he is not ?wearing them in clinic today. He is accompanied by his wife. ?On the anterior tibial surface of his right leg, there are 2 vertical wounds consistent with traumatic lacerations. The periwound skin is intact and there is no ?erythema induration or purulent drainage present. Both have significant slough and wound debris. ABIs in clinic were noncompressible. ?08/23/2021: Today, both wounds have some accumulated slough, but the more proximal one has partially closed leaving 2 separate small openings. The ?periwound is intact. Edema control is excellent. ?08/30/2021: The proximal wound is almost completely closed with [name] small opening remaining. Minimal slough in the wound base. The distal wound has filled in with ?good granulation tissue and continues to contract. Minimal slough present. Edema control is excellent. ?09/06/2021: The proximal wound is closed. The distal wound has contracted quite [name] bit and has [name] base of healthy granulation tissue. No slough. Edema control is ?excellent. ?Electronic Signature(s) ?Signed: 09/06/2021 11:42:48 AM By: Fredirick Maudlin MD FACS ?Entered By: Fredirick Maudlin on 09/06/2021 11:42:48 ?-------------------------------------------------------------------------------- ?Physical Exam Details ?Patient Name: Date of Service: ?Levi [name]. 09/06/2021 10:30 [name] M ?Medical Record Number: [id number] ?Patient Account Number: [id number] ?Date of Birth/Sex: Treating RN: ?[birth year] (76 y.o. M) ?Primary Care Provider: Shon Baton Other Clinician: ?Referring Provider: ?Treating Provider/Extender: Fredirick Maudlin ?Shon Baton ?Weeks in Treatment: 3 ?Constitutional ?. . . . No acute distress. ?Respiratory ?Normal work of breathing on room air. ?Notes ?09/06/2021: The proximal wound is closed. The distal wound has contracted and the base is clean with good granulation tissue and no slough. ?Electronic Signature(s) ?Signed: 09/06/2021 11:43:55 AM By: Fredirick Maudlin MD FACS ?Entered By: Fredirick Maudlin on 09/06/2021 11:43:55 ?-------------------------------------------------------------------------------- ?Physician Orders Details ?Patient Name: Date of Service: ?Levi [name]. 09/06/2021 10:30 [name] M ?Medical Record Number: [id number] ?Patient Account Number: [id number] ?Date of  Birth/Sex: Treating RN: ?26-Feb-1946 (76 y.o. M) ?Primary Care Provider: Shon Baton Other Clinician: ?Referring Provider: ?Treating Provider/Extender: Fredirick Maudlin ?Shon Baton ?Weeks in Treatment: 3 ?Verbal / Phone Orders: No ?Diagnosis Coding ?ICD-10 Coding ?Code Description ?S56.812 Non-pressure chronic ulcer of other part of right lower leg with fat layer exposed ?R29.6 Repeated falls ?G62.9 Polyneuropathy, unspecified ?T38.0X5S Adverse effect of glucocorticoids and synthetic analogues, sequela ?I51.9 Heart disease, unspecified ?J44.9 Chronic obstructive pulmonary disease, unspecified ?X51.700 Spinal stenosis, lumbar region with neurogenic claudication ?I48.91 Unspecified atrial fibrillation ?Follow-up Appointments ?ppointment in 1 week. - with Dr. Celine Ahr and Vaughan Basta Rm 1 ?Return [name] ?Bathing/ Shower/ Hygiene ?May shower with protection but do not get wound dressing(s) wet. - may purchase cast protector at [facility], [facility] or [place] ?Edema Control - Lymphedema / SCD / Other ?Elevate legs to the level of the heart or above for 30 minutes daily and/or when sitting, [name] frequency of: - throughout the day ?Avoid standing for long periods of time. ?Exercise regularly ?Compression stocking or Garment 20-30 mm/Hg pressure to: - compression stocking to left leg daily ?Wound Treatment ?Wound #2 - Lower Leg Wound Laterality: Right, Anterior, Distal ?Peri-Wound Care: Sween Lotion (Moisturizing lotion) 1 x Per Week ?Discharge Instructions: Apply moisturizing lotion as directed ?Prim Dressing: Promogran Prisma Matrix, 4.34 (sq in) (silver collagen) 1 x Per Week ?ary ?Discharge Instructions: Moisten collagen  with saline or hydrogel ?Secondary Dressing: ABD Pad, 8x10 1 x Per Week ?Discharge Instructions: Apply over primary dressing as directed. ?Compression Wrap: ThreePress (3 layer compression wrap) 1 x Per Week ?Discharge Instructions: Apply three layer compression as directed. ?Electronic Signature(s) ?Signed: 09/06/2021 1:06:36 PM By: Fredirick Maudlin MD FACS ?Entered By: Fredirick Maudlin on 09/06/2021 11:44:27 ?-------------------------------------------------------------------------------- ?Problem List Details ?Patient Name: ?Date of Service: ?Levi [name]. 09/06/2021 10:30 [name] M ?Medical Record Number: [id number] ?Patient Account Number: [id number] ?Date of Birth/Sex: ?Treating RN: ?08/03/1945 (76 y.o. M) ?Primary Care Provider: Shon Baton ?Other Clinician: ?Referring Provider: ?Treating Provider/Extender: Fredirick Maudlin ?Shon Baton ?Weeks in Treatment: 3 ?Active Problems ?ICD-10 ?Encounter ?Code Description Active Date MDM ?Diagnosis ?R91.638 Non-pressure chronic ulcer of other part of right lower leg with fat layer 08/16/2021 No Yes ?exposed ?R29.6 Repeated falls 08/16/2021 No Yes ?G62.9 Polyneuropathy, unspecified 08/16/2021 No Yes ?T38.0X5S Adverse effect of glucocorticoids and synthetic analogues, sequela 08/16/2021 No Yes ?I51.9 Heart disease, unspecified 08/16/2021 No Yes ?J44.9 Chronic obstructive pulmonary disease, unspecified 08/16/2021 No Yes ?G66.599 Spinal stenosis, lumbar region with neurogenic claudication 08/16/2021 No Yes ?I48.91 Unspecified atrial fibrillation 08/16/2021 No Yes ?Inactive Problems ?Resolved Problems ?Electronic Signature(s) ?Signed: 09/06/2021 11:41:30 AM By: Fredirick Maudlin MD FACS ?Entered By: Fredirick Maudlin on 09/06/2021 11:41:29 ?-------------------------------------------------------------------------------- ?Progress Note Details ?Patient Name: Date of Service: ?Levi [name]. 09/06/2021 10:30 [name] M ?Medical Record Number: [id number] ?Patient Account Number: [id number] ?Date of  Birth/Sex: Treating RN: ?1945/12/23 (76 y.o. M) ?Primary Care Provider: Shon Baton Other Clinician: ?Referring Provider: ?Treating Provider/Extender: Fredirick Maudlin ?Shon Baton ?Weeks in Treatment: 3 ?Subjec

## 2021-09-10 ENCOUNTER — Ambulatory Visit (INDEPENDENT_AMBULATORY_CARE_PROVIDER_SITE_OTHER): Payer: Medicare Other | Admitting: Gastroenterology

## 2021-09-10 ENCOUNTER — Encounter: Payer: Self-pay | Admitting: Gastroenterology

## 2021-09-10 VITALS — BP 122/64 | HR 90 | Ht 74.0 in | Wt 185.1 lb

## 2021-09-10 DIAGNOSIS — Z86010 Personal history of colonic polyps: Secondary | ICD-10-CM | POA: Diagnosis not present

## 2021-09-10 DIAGNOSIS — K219 Gastro-esophageal reflux disease without esophagitis: Secondary | ICD-10-CM | POA: Diagnosis not present

## 2021-09-10 DIAGNOSIS — Z7901 Long term (current) use of anticoagulants: Secondary | ICD-10-CM

## 2021-09-10 DIAGNOSIS — R079 Chest pain, unspecified: Secondary | ICD-10-CM

## 2021-09-10 DIAGNOSIS — K449 Diaphragmatic hernia without obstruction or gangrene: Secondary | ICD-10-CM

## 2021-09-10 DIAGNOSIS — I480 Paroxysmal atrial fibrillation: Secondary | ICD-10-CM | POA: Diagnosis not present

## 2021-09-10 MED ORDER — RABEPRAZOLE SODIUM 20 MG PO TBEC: 20.0000 mg | DELAYED_RELEASE_TABLET | Freq: Every day | ORAL | 1 refills | Status: DC

## 2021-09-10 NOTE — Patient Instructions (Addendum)
If you are age 75 or older, your body mass index should be between 23-30. Your Body mass index is 23.77 kg/m?[name] If this is out of the aforementioned range listed, please consider follow up with your Primary Care Provider. ? ?__________________________________________________________ ? ?[facility] providers would like to encourage you to use [facility] to communicate with providers for non-urgent requests or questions.  Due to long hold times on the telephone, sending your provider a message by [facility] may be a faster and more efficient way to get a response.  Please allow 48 business hours for a response.  Please remember that this is for non-urgent requests.   ? ?Due to recent changes in healthcare laws, you may see the results of your imaging and laboratory studies on MyChart before your provider has had a chance to review them.  We understand that in some cases there may be results that are confusing or concerning to you. Not all laboratory results come back in the same time frame and the provider may be waiting for multiple results in order to interpret others.  Please give [place] 48 hours in order for your provider to thoroughly review all the results before contacting the office for clarification of your results.   ? ?Please stop prilosec. ? ?We have sent the following medications to your pharmacy for you to pick up at your convenience: Aciphex ? ?Thank you for choosing me and [facility]. ? ?Gerrit Heck, D.O.  ? ? ?We want to thank you for trusting [facility] with your care. All of our staff and providers value the relationships we have built with our patients, and it is an honor to care for you.  ? ?We are writing to let you know that [facility] will close on Oct 08, 2021, and we invite you to continue to see Dr. Carmell Austria and Gerrit Heck at the [facility] [place] office location. We are consolidating our serices at these [facility] practices to better provide care. Our office staff will work with you to ensure a seamless transition.  ? ?Gerrit Heck, DO -Dr. Bryan Lemma will be movig to [facility] at [address], [place], [place] [address], effective Oct 08, 2021.  Contact [phone] to schedule an appointment with him.  ? ?Carmell Austria, MD- Dr. Lyndel Safe will be movig to [facility] at [address] [place],  [address], effective Oct 08, 2021.  Contact [phone] to schedule an appointment with him.  ? ?Requesting Medical Records ?If you need to request your medical records, please follow the instructions below. Your medical records are confidential, and a copy can be transferred to another provider or released to you or another person you designate only with your permission. ? ?There are several ways to request your medical records: ?Requests for medical records can be submitted through our practice.   ?You can also request your records electronically, in your MyChart account by selecting the ?Request Health Records? tab.  ?If you need additional information on how to request records, please go to [email or web address]/, choose Patient Information, then select Request Medical Records. ?To make an appointment or if you have any questions about your health care needs, please contact our office at [phone] and one of our staff members will be glad to assist you. ?[facility] is committed to providing exceptional care for you and our community. Thank you for allowing [place] to serve your health care needs. ?Sincerely, ? ?Windy Canny, Director [facility] ? also offers convenient virtual care options. Sore throat? Sinus problems? Cold or flu symptoms? Get care from the comfort of home with [facility] Video Visits and e-Visits. Learn more about the non-emergency conditions treated and start your virtual visit at [email or web address]  ?

## 2021-09-10 NOTE — Progress Notes (Signed)
? ?Chief Complaint:    GERD, hiatal hernia ? 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 ? ?Longstanding history of GERD with index symptoms of heartburn, regurgitation, waterbrash.  Also with noncardiac chest pain.  Symptoms can occur throughout the day, and rarely nocturnal symptoms.  Still with breakthrough reflux symptoms despite omeprazole 40 mg/day. Previously trialed Protonix.  ?  ?Endoscopic Hx: ?-Colonoscopy in 01/2013 with 3 small tubular adenomas. ?-Colonoscopy in 01/2015 with 1 small tubular adenoma, with recommendation to repeat in 5 years ?-EGD (02/2019): Esophageal candidiasis (bxs negative though), benign distal esophageal stenosis (nondilated).  Treated with fluconazole ?-Colonoscopy (03/2020, Dr. Bryan [name]): 6 tubular adenomas, pandiverticulosis, internal hemorrhoids.  Fair prep.  Repeat in 1 year ?- EGD (05/2020, Dr. Bryan [name]): Benign stenosis at 42 cm dilated with 54Fr Levi [name] with mucosal disruption at 18 cm consistent with proximal stricture, esophageal candidiasis, mild gastritis ?- EGD (12/2020, Dr. Bryan [name]): Normal esophagus, mild non-H. pylori gastritis, benign gastric polyp ? ?- Esophageal Manometry (05/2021): Normal.  No manometric evidence of hiatal hernia ?- pH/Impedance (05/2021, off PPI): Abnormal esophageal acid exposure with percent time pH <4 in 9.6%.  DeMeester score 38.6.  Abnormal esophageal acid exposure in upright and supine positions.  No symptom correlation for heartburn or chest pain. ?- UGI (3/23): Mild esophageal dysmotility and small reducible HH.  Barium tablet passes without delay.  Normal stomach/duodenum. ? ?HPI:   ? ? ?Patient is a 76 y.o. male presenting to the Gastroenterology Clinic for follow-up.  Last seen by me on 06/18/2021  with continued noncardiac chest pain and GERD.  Due to neck mobility issues, prior C-spine surgeries, not a candidate for TIF.  He was then referred to Dr. Redmond [name] at [place] [facility] with plan for HHR/fundoplication.  Surgery delayed due to recent falls, rib fracture, difficulty controlling leg edema, chronic prednisone.  He presents today to discuss additional medical management options for GERD. ? ?Currently taking Lasix 40 mg daily. ? ?Still with breakthrough reflux sxs 2 times/week ([facility]) and mild HB daily.  ? ?Had another C spine surgery with fusion 2 months ago.  ? ?Review of systems:     No chest pain, no SOB, no fevers, no urinary sx  ? ?Past Medical History:  ?Diagnosis Date  ?? Agent orange exposure 1970's  ? takes Imdur,Diltiazem, and Atacand daily  ?? ALLERGIC RHINITIS   ?? Allergy   ? seasonal allergies  ?? Asthma   ? uses inhaler and nebulizers  ?? Bruises easily   ? d/t prednisone daily  ?? Cataract   ?? CHF (congestive heart failure) ([place])   ? dx-on meds to treat  ?? Chronic bronchitis ([place])   ? "used to get it q yr; last time ?2008" (04/28/2012)  ?? COPD (chronic obstructive pulmonary disease) ([place])   ? agent orange exposure  ?? Degenerative disk disease   ? "everywhere" (04/28/2012)  ?? Diverticulitis   ?? Dysrhythmia   ? afib  ?? Elevated uric acid in blood   ? takes Allopurinol daily  ?? Emphysema of lung ([place])   ?? Enlarged prostate   ? but not on any meds  ?? GERD (gastroesophageal reflux disease)   ? takes Protonix daily  ?? H/O hiatal hernia   ?? Headache   ?? History of pneumonia   ? a. 2010  ?? Hyperlipidemia   ?  takes Lipitor daily; pt. states he takes a preventive  ?? Hypothyroidism   ?? Joint pain   ?? Joint swelling   ?? Neuromuscular disorder ([place])   ? bilat legs, and bilat arms  ?? PAF (paroxysmal atrial fibrillation) ([place])   ? a. Dx 04/2012, CHA2DS2VASc = 1 (age);  b. 04/2012 Echo: EF 40-50%, mild MR.  ?? Personal history of colonic adenomas 02/09/2013  ?? Pneumonia   ?? Shortness of breath dyspnea    ?? Thyroid disease   ? no taking meds-caused dizziness  ? ? ?Patient's surgical history, family medical history, social history, medications and allergies were all reviewed in Epic  ? ? ?Current Outpatient Medications  ?Medication Sig Dispense Refill  ?? albuterol (VENTOLIN HFA) 108 (90 Base) MCG/ACT inhaler INHALE 2 PUFFS INTO THE LUNGS EVERY 4 (FOUR) HOURS AS NEEDED FOR WHEEZING OR SHORTNESS OF BREATH. 18 each 12  ?? allopurinol (ZYLOPRIM) 100 MG tablet Take 100 mg by mouth daily.    ?? atorvastatin (LIPITOR) 20 MG tablet TAKE 1 TABLET BY MOUTH EVERY DAY 90 tablet 3  ?? candesartan (ATACAND) 4 MG tablet TAKE 1/2 TABLETS (2 MG TOTAL) BY MOUTH DAILY. 45 tablet 3  ?? cyclobenzaprine (FLEXERIL) 10 MG tablet Take 1 tablet (10 mg total) by mouth 3 (three) times daily as needed for muscle spasms. 30 tablet 1  ?? diclofenac Sodium (VOLTAREN) 1 % GEL Apply 2 g topically daily as needed (Pain).    ?? diltiazem (CARDIZEM CD) 180 MG 24 hr capsule TAKE 1 CAPSULE BY MOUTH EVERY DAY 90 capsule 3  ?? diltiazem (CARDIZEM) 30 MG tablet Take 30 mg by mouth as needed (heart rate over 120).    ?? DULoxetine (CYMBALTA) 30 MG capsule Take 1 capsule (30 mg total) by mouth daily. 30 capsule 5  ?? ELIQUIS 5 MG TABS tablet TAKE 1 TABLET BY MOUTH TWICE A DAY 60 tablet 6  ?? furosemide (LASIX) 40 MG tablet Take 1 tablet (40 mg total) by mouth daily. As directed 90 tablet 3  ?? ipratropium-albuterol (DUONEB) 0.5-2.5 (3) MG/3ML SOLN 1 NEBULE EVERY 6 HOURS AS NEEDED. **J45.3** 270 mL 4  ?? isosorbide mononitrate (IMDUR) 30 MG 24 hr tablet TAKE 1 TABLET (30 MG TOTAL) BY MOUTH DAILY. 30 tablet 11  ?? Nebulizers (COMPRESSOR/NEBULIZER) MISC Use as directed 1 each 0  ?? omeprazole (PRILOSEC) 40 MG capsule Take 1 capsule (40 mg total) by mouth daily. 90 capsule 1  ?? oxyCODONE-acetaminophen (PERCOCET/ROXICET) 5-325 MG tablet Take 1-2 tablets by mouth 2 (two) times daily as needed for severe pain. 30 tablet 0  ?? potassium chloride SA (KLOR-CON M20)  20 MEQ tablet Take 1 tablet (20 mEq total) by mouth daily. 90 tablet 3  ?? predniSONE (DELTASONE) 10 MG tablet TAKE 2 TABLETS (20 MG TOTAL) BY MOUTH DAILY. CONTINUOUS 30 tablet 5  ?? STRIVERDI RESPIMAT 2.5 MCG/ACT AERS INHALE 2 PUFFS BY MOUTH INTO THE LUNGS DAILY 4 g 4  ?? tamsulosin (FLOMAX) 0.4 MG CAPS capsule Take 0.4 mg by mouth at bedtime.   11  ?? Sod Picosulfate-Mag Ox-Cit Acd (CLENPIQ) 10-3.5-12 MG-GM -GM/160ML SOLN Take 1 kit by mouth as directed. (Patient not taking: Reported on 09/10/2021) 320 mL 0  ? ?No current facility-administered medications for this visit.  ? ? ?Physical Exam:   ? ? ?BP 122/64 (BP Location: Left Arm, Patient Position: Sitting, Cuff Size: Normal)   Pulse 90   Ht _0  (1.88 m)   Wt 185 lb 2 oz (84 kg)  SpO2 94%   BMI 23.77 kg/m?  ? ?GENERAL:  Pleasant male in NAD ?PSYCH: : Cooperative, normal affect ?NEURO: Alert and oriented x 3, no focal neurologic deficits ? ? ?IMPRESSION and PLAN:   ? ?1) GERD ?2) Hiatal hernia ?3) Noncardiac chest pain ?- Start Aciphex 20 mg daily.  Stop omeprazole ?- Continue antireflux lifestyle/dietary modifications ?- Due to limited neck mobility and multiple prior C-spine surgeries, not a good candidate for TIF ?- Was evaluated by Dr. Redmond [name] at [facility].  Elevated perioperative risks related to recent fall with rib fracture, chronic steroid use, issues with edema, etc.  Dr. Redmond [name] offered referral for second opinion if patient would like to consider that.  Plan to modify medical management as above, and can revisit surgical options pending response to Aciphex ? ?4) History of colon polyps ?- Last colonoscopy was 2021 with tubular adenoma x6 and fair prep.  Colonoscopy was ordered at the time of last appointment, but postponed due to fall with rib fracture, edema issues, etc.  Will revisit after addressing reflux issues along with Cardiology follow-up ? ?5) Atrial fibrillation ?6) Chronic anticoagulation ?- Will eventually need Cardiology clearance to  proceed with colonoscopy, along with clearance to hold Eliquis x2 days ? ?RTC in 3 months or sooner as needed ?    ? ?Lavena Bullion ,DO, FACG 09/10/2021, 8:48 AM ? ?

## 2021-09-13 ENCOUNTER — Encounter (HOSPITAL_BASED_OUTPATIENT_CLINIC_OR_DEPARTMENT_OTHER): Payer: Medicare Other | Admitting: General Surgery

## 2021-09-13 DIAGNOSIS — I4891 Unspecified atrial fibrillation: Secondary | ICD-10-CM | POA: Diagnosis not present

## 2021-09-13 DIAGNOSIS — G629 Polyneuropathy, unspecified: Secondary | ICD-10-CM | POA: Diagnosis not present

## 2021-09-13 DIAGNOSIS — M48062 Spinal stenosis, lumbar region with neurogenic claudication: Secondary | ICD-10-CM | POA: Diagnosis not present

## 2021-09-13 DIAGNOSIS — I110 Hypertensive heart disease with heart failure: Secondary | ICD-10-CM | POA: Diagnosis not present

## 2021-09-13 DIAGNOSIS — J449 Chronic obstructive pulmonary disease, unspecified: Secondary | ICD-10-CM | POA: Diagnosis not present

## 2021-09-13 DIAGNOSIS — L97812 Non-pressure chronic ulcer of other part of right lower leg with fat layer exposed: Secondary | ICD-10-CM | POA: Diagnosis not present

## 2021-09-13 NOTE — Progress Notes (Signed)
GIAN, YBARRA ([id number]) ?Visit Report for 09/13/2021 ?Arrival Information Details ?Patient Name: Date of Service: ?Chaya Jan HN A. 09/13/2021 10:15 A M ?Medical Record Number: [id number] ?Patient Account Number: [id number] ?Date of Birth/Sex: Treating RN: ?1945-10-29 (76 y.o. Jerilynn Mages) Dellie Catholic ?Primary Care Ura Yingling: Shon Baton Other Clinician: ?Referring Sarai January: ?Treating Kieli Golladay/Extender: Fredirick Maudlin ?Shon Baton ?Weeks in Treatment: 4 ?Visit Information History Since Last Visit ?Added or deleted any medications: No ?Patient Arrived: [name] ?Any new allergies or adverse reactions: No ?Arrival Time: 10:24 ?Had a fall or experienced change in No ?Accompanied By: self ?activities of daily living that may affect ?Transfer Assistance: None ?risk of falls: ?Patient Identification Verified: Yes ?Signs or symptoms of abuse/neglect since last visito No ?Patient Requires Transmission-Based Precautions: No ?Hospitalized since last visit: No ?Patient Has Alerts: Yes ?Has Dressing in Place as Prescribed: Yes ?Patient Alerts: Patient on Blood Thinner ?Has Compression in Place as Prescribed: Yes ?R ABI N/C ?Pain Present Now: No ?Electronic Signature(s) ?Signed: 09/13/2021 4:53:42 PM By: Dellie Catholic RN ?Entered By: Dellie Catholic on 09/13/2021 10:24:58 ?-------------------------------------------------------------------------------- ?Clinic Level of Care Assessment Details ?Patient Name: Date of Service: ?Chaya Jan HN A. 09/13/2021 10:15 A M ?Medical Record Number: [id number] ?Patient Account Number: [id number] ?Date of Birth/Sex: Treating RN: ?05-09-1946 (76 y.o. Jerilynn Mages) Dellie Catholic ?Primary Care Betty Daidone: Shon Baton Other Clinician: ?Referring Martavius Lusty: ?Treating Milt Coye/Extender: Fredirick Maudlin ?Shon Baton ?Weeks in Treatment: 4 ?Clinic Level of Care Assessment Items ?TOOL 4 Quantity Score ?X- 1 0 ?Use when only an EandM is performed on FOLLOW-UP visit ?ASSESSMENTS - Nursing Assessment / Reassessment ?X- 1  10 ?Reassessment of Co-morbidities (includes updates in patient status) ?X- 1 5 ?Reassessment of Adherence to Treatment Plan ?ASSESSMENTS - Wound and Skin A ssessment / Reassessment ?X - Simple Wound Assessment / Reassessment - one wound 1 5 ?'[]'$  - 0 ?Complex Wound Assessment / Reassessment - multiple wounds ?'[]'$  - 0 ?Dermatologic / Skin Assessment (not related to wound area) ?ASSESSMENTS - Focused Assessment ?'[]'$  - 0 ?Circumferential Edema Measurements - multi extremities ?'[]'$  - 0 ?Nutritional Assessment / Counseling / Intervention ?'[]'$  - 0 ?Lower Extremity Assessment (monofilament, tuning fork, pulses) ?'[]'$  - 0 ?Peripheral Arterial Disease Assessment (using hand held doppler) ?ASSESSMENTS - Ostomy and/or Continence Assessment and Care ?'[]'$  - 0 ?Incontinence Assessment and Management ?'[]'$  - 0 ?Ostomy Care Assessment and Management (repouching, etc.) ?PROCESS - Coordination of Care ?X - Simple Patient / Family Education for ongoing care 1 15 ?'[]'$  - 0 ?Complex (extensive) Patient / Family Education for ongoing care ?X- 1 10 ?Staff obtains Consents, Records, T Results / Process Orders ?est ?X- 1 10 ?Staff telephones HHA, Nursing Homes / Clarify orders / etc ?'[]'$  - 0 ?Routine Transfer to another Facility (non-emergent condition) ?'[]'$  - 0 ?Routine Hospital Admission (non-emergent condition) ?'[]'$  - 0 ?New Admissions / Biomedical engineer / Ordering NPWT Apligraf, etc. ?, ?'[]'$  - 0 ?Emergency Hospital Admission (emergent condition) ?X- 1 10 ?Simple Discharge Coordination ?'[]'$  - 0 ?Complex (extensive) Discharge Coordination ?PROCESS - Special Needs ?'[]'$  - 0 ?Pediatric / Minor Patient Management ?'[]'$  - 0 ?Isolation Patient Management ?'[]'$  - 0 ?Hearing / Language / Visual special needs ?'[]'$  - 0 ?Assessment of Community assistance (transportation, D/C planning, etc.) ?'[]'$  - 0 ?Additional assistance / Altered mentation ?'[]'$  - 0 ?Support Surface(s) Assessment (bed, cushion, seat, etc.) ?INTERVENTIONS - Wound Cleansing / Measurement ?X - Simple  Wound Cleansing - one wound 1 5 ?'[]'$  - 0 ?Complex Wound Cleansing - multiple wounds ?X- 1 5 ?Wound Imaging (photographs -  any number of wounds) ?'[]'$  - 0 ?Wound Tracing (instead of photographs) ?X- 1 5 ?Simple Wound Measurement - one wound ?'[]'$  - 0 ?Complex Wound Measurement - multiple wounds ?INTERVENTIONS - Wound Dressings ?'[]'$  - 0 ?Small Wound Dressing one or multiple wounds ?'[]'$  - 0 ?Medium Wound Dressing one or multiple wounds ?'[]'$  - 0 ?Large Wound Dressing one or multiple wounds ?'[]'$  - 0 ?Application of Medications - topical ?'[]'$  - 0 ?Application of Medications - injection ?INTERVENTIONS - Miscellaneous ?'[]'$  - 0 ?External ear exam ?'[]'$  - 0 ?Specimen Collection (cultures, biopsies, blood, body fluids, etc.) ?'[]'$  - 0 ?Specimen(s) / Culture(s) sent or taken to Lab for analysis ?'[]'$  - 0 ?Patient Transfer (multiple staff / Civil Service fast streamer / Similar devices) ?'[]'$  - 0 ?Simple Staple / Suture removal (25 or less) ?'[]'$  - 0 ?Complex Staple / Suture removal (26 or more) ?'[]'$  - 0 ?Hypo / Hyperglycemic Management (close monitor of Blood Glucose) ?'[]'$  - 0 ?Ankle / Brachial Index (ABI) - do not check if billed separately ?X- 1 5 ?Vital Signs ?Has the patient been seen at the hospital within the last three years: Yes ?Total Score: 85 ?Level Of Care: New/Established - Level 3 ?Electronic Signature(s) ?Signed: 09/13/2021 4:53:42 PM By: Dellie Catholic RN ?Entered By: Dellie Catholic on 09/13/2021 10:51:26 ?-------------------------------------------------------------------------------- ?Encounter Discharge Information Details ?Patient Name: ?Date of Service: ?Chaya Jan HN A. 09/13/2021 10:15 A M ?Medical Record Number: [id number] ?Patient Account Number: [id number] ?Date of Birth/Sex: ?Treating RN: ?[birth year] (76 y.o. Jerilynn Mages) Dellie Catholic ?Primary Care Emelie Newsom: Shon Baton ?Other Clinician: ?Referring Pearson Reasons: ?Treating Leonard Hendler/Extender: Fredirick Maudlin ?Shon Baton ?Weeks in Treatment: 4 ?Encounter Discharge Information Items ?Discharge Condition:  Stable ?Ambulatory Status: [name] ?Discharge Destination: Home ?Transportation: Private Auto ?Accompanied By: self ?Schedule Follow-up Appointment: Yes ?Clinical Summary of Care: Patient Declined ?Electronic Signature(s) ?Signed: 09/13/2021 4:53:42 PM By: Dellie Catholic RN ?Entered By: Dellie Catholic on 09/13/2021 10:52:27 ?-------------------------------------------------------------------------------- ?Lower Extremity Assessment Details ?Patient Name: ?Date of Service: ?Chaya Jan HN A. 09/13/2021 10:15 A M ?Medical Record Number: [id number] ?Patient Account Number: [id number] ?Date of Birth/Sex: ?Treating RN: ?15-Jul-1945 (76 y.o. Jerilynn Mages) Dellie Catholic ?Primary Care Biridiana Twardowski: Shon Baton ?Other Clinician: ?Referring Heatherly Stenner: ?Treating Galit Urich/Extender: Fredirick Maudlin ?Shon Baton ?Weeks in Treatment: 4 ?Edema Assessment ?Assessed: [Left: No] [Right: No] ?Edema: [Left: Ye] [Right: s] ?Calf ?Left: Right: ?Point of Measurement: From Medial Instep 35.3 cm ?Ankle ?Left: Right: ?Point of Measurement: From Medial Instep 22.5 cm ?Electronic Signature(s) ?Signed: 09/13/2021 4:53:42 PM By: Dellie Catholic RN ?Entered By: Dellie Catholic on 09/13/2021 10:27:16 ?-------------------------------------------------------------------------------- ?Multi Wound Chart Details ?Patient Name: ?Date of Service: ?Chaya Jan HN A. 09/13/2021 10:15 A M ?Medical Record Number: [id number] ?Patient Account Number: [id number] ?Date of Birth/Sex: ?Treating RN: ?1945-07-11 (76 y.o. M) ?Primary Care Vyom Brass: Shon Baton ?Other Clinician: ?Referring Kamarian Sahakian: ?Treating Vickie Ponds/Extender: Fredirick Maudlin ?Shon Baton ?Weeks in Treatment: 4 ?Vital Signs ?Height(in): 74 ?Pulse(bpm): 101 ?Weight(lbs): 187 ?Blood Pressure(mmHg): 130/69 ?Body Mass Index(BMI): 24 ?Temperature(??F): 97.6 ?Respiratory Rate(breaths/min): 18 ?Photos: [N/A:N/A] ?Right, Distal, Anterior Lower Leg N/A N/A ?Wound Location: ?Trauma N/A N/A ?Wounding Event: ?Abrasion N/A N/A ?Primary  Etiology: ?Venous Leg Ulcer N/A N/A ?Secondary Etiology: ?Cataracts, Chronic sinus N/A N/A ?Comorbid History: ?problems/congestion, Asthma, Chronic ?Obstructive Pulmonary Disease ?(COPD), Pneumothorax, Arrhythm

## 2021-09-13 NOTE — Progress Notes (Addendum)
JOVANNIE, ULIBARRI ([id number]) ?Visit Report for 09/13/2021 ?Chief Complaint Document Details ?Patient Name: Date of Service: ?Chaya Jan HN A. 09/13/2021 10:15 A M ?Medical Record Number: [id number] ?Patient Account Number: [id number] ?Date of Birth/Sex: Treating RN: ?January 14, 1946 (76 y.o. M) ?Primary Care Provider: Shon Baton Other Clinician: ?Referring Provider: ?Treating Provider/Extender: Fredirick Maudlin ?Shon Baton ?Weeks in Treatment: 4 ?Information Obtained from: Patient ?Chief Complaint ?Patient seen for complaints of Non-Healing Wound (x2). ?Electronic Signature(s) ?Signed: 09/13/2021 10:44:16 AM By: Fredirick Maudlin MD FACS ?Entered By: Fredirick Maudlin on 09/13/2021 10:44:16 ?-------------------------------------------------------------------------------- ?HPI Details ?Patient Name: Date of Service: ?Chaya Jan HN A. 09/13/2021 10:15 A M ?Medical Record Number: [id number] ?Patient Account Number: [id number] ?Date of Birth/Sex: Treating RN: ?[birth year] (76 y.o. M) ?Primary Care Provider: Shon Baton Other Clinician: ?Referring Provider: ?Treating Provider/Extender: Fredirick Maudlin ?Shon Baton ?Weeks in Treatment: 4 ?History of Present Illness ?HPI Description: ADMISSION ?08/16/2021 ?This is a 76 year old man with a history of multiple falls here for evaluation of 2 traumatic wounds to his right lower extremity. He had a fall in December which ?resulted in the laceration that is proximal and a fall in February that opened up the distal wound. His falls are secondary to significant idiopathic peripheral ?neuropathy; he just cannot tell where his feet are sometimes. He has also had multiple orthopedic surgeries including on his back for neurogenic claudication. ?He does not have diabetes. He does have COPD and is currently taking 20 mg of prednisone daily. He is on Eliquis for atrial fibrillation. At 1 point he did take ?doxycycline for the wounds and since, they have been applying bacitracin. He does have compression  stockings which he says he wears, but he is not ?wearing them in clinic today. He is accompanied by his wife. ?On the anterior tibial surface of his right leg, there are 2 vertical wounds consistent with traumatic lacerations. The periwound skin is intact and there is no ?erythema induration or purulent drainage present. Both have significant slough and wound debris. ABIs in clinic were noncompressible. ?08/23/2021: Today, both wounds have some accumulated slough, but the more proximal one has partially closed leaving 2 separate small openings. The ?periwound is intact. Edema control is excellent. ?08/30/2021: The proximal wound is almost completely closed with a small opening remaining. Minimal slough in the wound base. The distal wound has filled in with ?good granulation tissue and continues to contract. Minimal slough present. Edema control is excellent. ?09/06/2021: The proximal wound is closed. The distal wound has contracted quite a bit and has a base of healthy granulation tissue. No slough. Edema control is ?excellent. ?09/13/2021: His wounds are healed. ?Electronic Signature(s) ?Signed: 09/13/2021 10:44:39 AM By: Fredirick Maudlin MD FACS ?Entered By: Fredirick Maudlin on 09/13/2021 10:44:38 ?-------------------------------------------------------------------------------- ?Physical Exam Details ?Patient Name: Date of Service: ?Chaya Jan HN A. 09/13/2021 10:15 A M ?Medical Record Number: [id number] ?Patient Account Number: [id number] ?Date of Birth/Sex: Treating RN: ?[birth year] (76 y.o. M) ?Primary Care Provider: Shon Baton Other Clinician: ?Referring Provider: ?Treating Provider/Extender: Fredirick Maudlin ?Shon Baton ?Weeks in Treatment: 4 ?Constitutional ?. He is slightly tachycardic, asymptomatic.. . . No acute distress. ?Respiratory ?Normal work of breathing on room air. ?Notes ?09/13/2021: His wounds are healed. ?Electronic Signature(s) ?Signed: 09/13/2021 10:46:05 AM By: Fredirick Maudlin MD FACS ?Entered By:  Fredirick Maudlin on 09/13/2021 10:46:03 ?-------------------------------------------------------------------------------- ?Physician Orders Details ?Patient Name: Date of Service: ?Chaya Jan HN A. 09/13/2021 10:15 A M ?Medical Record Number: [id number] ?Patient Account Number: [id number] ?Date of Birth/Sex: Treating RN: ?[birth year] (76 y.o. Jerilynn Mages) Dellie Catholic ?  Primary Care Provider: Shon Baton Other Clinician: ?Referring Provider: ?Treating Provider/Extender: Fredirick Maudlin ?Shon Baton ?Weeks in Treatment: 4 ?Verbal / Phone Orders: No ?Diagnosis Coding ?ICD-10 Coding ?Code Description ?A76.811 Non-pressure chronic ulcer of other part of right lower leg with fat layer exposed ?R29.6 Repeated falls ?G62.9 Polyneuropathy, unspecified ?T38.0X5S Adverse effect of glucocorticoids and synthetic analogues, sequela ?I51.9 Heart disease, unspecified ?J44.9 Chronic obstructive pulmonary disease, unspecified ?X72.620 Spinal stenosis, lumbar region with neurogenic claudication ?I48.91 Unspecified atrial fibrillation ?Discharge From [facility] ?Discharge from [place] on your wound healing! ?Electronic Signature(s) ?Signed: 09/13/2021 10:46:41 AM By: Fredirick Maudlin MD FACS ?Entered By: Fredirick Maudlin on 09/13/2021 10:46:40 ?-------------------------------------------------------------------------------- ?Problem List Details ?Patient Name: ?Date of Service: ?Chaya Jan HN A. 09/13/2021 10:15 A M ?Medical Record Number: [id number] ?Patient Account Number: [id number] ?Date of Birth/Sex: ?Treating RN: ?1945-09-01 (76 y.o. M) ?Primary Care Provider: Shon Baton ?Other Clinician: ?Referring Provider: ?Treating Provider/Extender: Fredirick Maudlin ?Shon Baton ?Weeks in Treatment: 4 ?Active Problems ?ICD-10 ?Encounter ?Code Description Active Date MDM ?Diagnosis ?A45.364 Non-pressure chronic ulcer of other part of right lower leg with fat layer 08/16/2021 No Yes ?exposed ?R29.6 Repeated falls 08/16/2021 No Yes ?G62.9  Polyneuropathy, unspecified 08/16/2021 No Yes ?T38.0X5S Adverse effect of glucocorticoids and synthetic analogues, sequela 08/16/2021 No Yes ?I51.9 Heart disease, unspecified 08/16/2021 No Yes ?J44.9 Chronic obstructive pulmonary disease, unspecified 08/16/2021 No Yes ?W80.321 Spinal stenosis, lumbar region with neurogenic claudication 08/16/2021 No Yes ?I48.91 Unspecified atrial fibrillation 08/16/2021 No Yes ?Inactive Problems ?Resolved Problems ?Electronic Signature(s) ?Signed: 09/13/2021 10:42:43 AM By: Fredirick Maudlin MD FACS ?Entered By: Fredirick Maudlin on 09/13/2021 10:42:43 ?-------------------------------------------------------------------------------- ?Progress Note Details ?Patient Name: ?Date of Service: ?Chaya Jan HN A. 09/13/2021 10:15 A M ?Medical Record Number: [id number] ?Patient Account Number: [id number] ?Date of Birth/Sex: Treating RN: ?05/07/1946 (76 y.o. M) ?Primary Care Provider: Other Clinician: ?Shon Baton ?Referring Provider: ?Treating Provider/Extender: Fredirick Maudlin ?Shon Baton ?Weeks in Treatment: 4 ?Subjective ?Chief Complaint ?Information obtained from Patient ?Patient seen for complaints of Non-Healing Wound (x2). ?History of Present Illness (HPI) ?ADMISSION ?08/16/2021 ?This is a 76 year old man with a history of multiple falls here for evaluation of 2 traumatic wounds to his right lower extremity. He had a fall in December which ?resulted in the laceration that is proximal and a fall in February that opened up the distal wound. His falls are secondary to significant idiopathic peripheral ?neuropathy; he just cannot tell where his feet are sometimes. He has also had multiple orthopedic surgeries including on his back for neurogenic claudication. ?He does not have diabetes. He does have COPD and is currently taking 20 mg of prednisone daily. He is on Eliquis for atrial fibrillation. At 1 point he did take ?doxycycline for the wounds and since, they have been applying bacitracin. He does  have compression stockings which he says he wears, but he is not ?wearing them in clinic today. He is accompanied by his wife. ?On the anterior tibial surface of his right leg, there are 2 vertical woun

## 2021-09-15 ENCOUNTER — Other Ambulatory Visit: Payer: Self-pay | Admitting: Gastroenterology

## 2021-09-17 ENCOUNTER — Telehealth: Payer: Self-pay | Admitting: Gastroenterology

## 2021-09-17 NOTE — Telephone Encounter (Signed)
Please increase Aciphex to 20 mg BID, and continue taking 30-60 minutes prior to breakfast and dinner.  If no response over the next week or so, yes will go back to the omeprazole since that worked better for him.  Per prior discussion with Dr. Redmond Pulling, okay to send referral to surgery at [facility] for second opinion on hiatal hernia repair and antireflux surgery. ?

## 2021-09-17 NOTE — Telephone Encounter (Signed)
Spoke with pt and gave pt recommendations. Pt verbalized understanding and stated he didn't need another prescription of the Aciphex right now, he would just start taking it 2x a day 30-60 min before breakfast and dinner and let us know in the future if he runs out. Referral faxed to [phone], phone # is: [phone].  ?

## 2021-09-17 NOTE — Telephone Encounter (Signed)
Inbound call from patient stating that Dr. Bryan Lemma had given him Aciphex and that it was not working. Patient stated that he and Dr. Bryan Lemma had dicussed that if it didn't work he would refer him to a Psychologist, sport and exercise. Patient is seeking advice on what the next steps are moving forward. Please advise.  ?

## 2021-09-17 NOTE — Telephone Encounter (Signed)
Pt had OV last Monday on 4/17 and states he has taken the Aciphex 30 min to an hour before breakfast for the past week and his symptoms have gotten worse. Pt wanted to know if he should return to the omeprazole because he felt like it worked better for him than the Aciphex. Pt also wanted a referral to surgery.  ?

## 2021-09-19 NOTE — Progress Notes (Signed)
? ?Subjective:  ? ? Patient ID: Levi [name], male    DOB: June 25, 1945, 76 y.o.   MRN: [id number] ?HPI ? Male never smoker followed for allergy/asthma, rhinitis, history  ABPA, complicated by PAfib, GERD ?PFT- 01/04/2015-minimal restriction, minimal diffusion defect, insignificant response to bronchodilator. FVC 4.55/86%, FEV1 3.28/83%, FEV1/FVC 0.72, TLC 75%, DLCO 77 ?Office Spirometry 10/25/16-mild restriction of exhaled volume, minimal obstruction. FVC 4.04/78%, FEV1 2.71/71%, ratio 0.67, FEF 25-75% 1.68/58% ?Respiratory allergy profile 10/26/15- total IgE 60, minor elevations for dust mites and Aspergillus fumigatus ?---------------------------------------------------------------------------------------------------. ? ? ?09/19/20- 76 year old male never smoker followed for allergy/asthma, rhinitis, history ABPA, complicated by PAfib, CHF, GERD, Hypogammaglobulinemia, Chronic Steroid Therapy,  ?Striverdi 2 puffs daily, pred maint 10 mg daily, Neb Duoneb,  albuterol hfa ?ED visit 09/13/20- .dizzy> orthostasis ?Leg Dopplers 3/9- neg for DVT ?-----Patient states he has a dry cough, breathing is good.  ?Dry cough with pollen season ?Low BP confirmed 86/60 both arms, 2 nurses. On Atacand,cardizem and prn lasix ?He denies noting orthostatic dizziness- but we note recent ED visit for same. [name]  ?He considers breathing stable except for the cough. Long term maintenance prednisone controlled bronchospasm better than anything else, and can  assume he is now adrenal insufficient. ? ?09/20/21- 76 year old male never smoker followed for allergy/asthma, rhinitis, history ABPA, complicated by PAfib, CHF, GERD, Hypogammaglobulinemia, Chronic Steroid Therapy,  ?-Striverdi 2 puffs daily, pred maint 10 mg daily, Neb Duoneb,  albuterol hfa ?------Follow up. Patient has no complaints ?Frequent cough but considers breathing stable. Pending hiatal hernia surgery. Hopefully this will help cough. ?Says edema currently better. ?CXR  07/07/21- ?IMPRESSION: ?1. No acute findings. ?2. Healing right-sided sixth rib fracture.  No new fractures. ? ? ?ROS-see HPI   + = positive ?Constitutional:    weight loss, night sweats, fevers, chills, fatigue, lassitude. ?HEENT:    headaches, difficulty swallowing, tooth/dental problems, sore throat,  ?     sneezing, itching, ear ache, nasal congestion, post nasal drip, snoring ?CV:    chest pain, orthopnea, PND, +swelling in lower extremities, anasarca,                                           dizziness, palpitations ?Resp:   + shortness of breath with exertion or at rest.   ?             productive cough,  + non-productive cough, coughing up of blood.   ?           change in color of mucus.   wheezing.   ?Skin:    rash or lesions. ?GI:  No-   heartburn, indigestion, abdominal pain, nausea, vomiting, diarrhea,  ?               change in bowel habits, loss of appetite ?GU: dysuria, change in color of urine, no urgency or frequency.   flank pain. ?MS:   joint pain, stiffness, decreased range of motion, back pain.. L scapula not tender. ?Neuro-     nothing unusual ?Psych:  change in mood or affect.  depression or anxiety.   memory loss. ?   ?Objective:  ?OBJ- Physical Exam ?General- Alert, Oriented, Affect-appropriate, Distress- none acute, tall/thin ?Skin- rash-none, lesions- none, excoriation- none ?Lymphadenopathy- none ?Head- atraumatic ?           Eyes- Gross vision intact, PERRLA, conjunctivae and secretions clear ?  Ears- Hearing, canals-normal ?           Nose- Clear, no-Septal dev, mucus, polyps, erosion, perforation  ?           Throat- Mallampati II , mucosa clear , drainage- none, tonsils- atrophic ?Neck- flexible , trachea midline, no stridor , thyroid nl, carotid no bruit ?Chest - symmetrical excursion , unlabored ?          Heart/CV- RRR , no murmur , no gallop  , no rub, nl s1 s2 ?                          - JVD+trace , edema+2 pitting, stasis changes- none, varices+ Left calf ?           Lung- +distant/ few crackles wheeze- none, cough- none , dullness-none, rub- none ?          Chest wall-  ?Abd-  ?Br/ Gen/ Rectal- Not done, not indicated ?Extrem- cyanosis- none, clubbing, none, atrophy- none, strength- nl ?Neuro- + head bob and hands tremor ?   ?Assessment & Plan:  ? ?

## 2021-09-20 ENCOUNTER — Ambulatory Visit (INDEPENDENT_AMBULATORY_CARE_PROVIDER_SITE_OTHER): Payer: Medicare Other | Admitting: Internal Medicine

## 2021-09-20 ENCOUNTER — Encounter: Payer: Self-pay | Admitting: Gastroenterology

## 2021-09-20 ENCOUNTER — Encounter: Payer: Self-pay | Admitting: Internal Medicine

## 2021-09-20 DIAGNOSIS — I2510 Atherosclerotic heart disease of native coronary artery without angina pectoris: Secondary | ICD-10-CM

## 2021-09-20 DIAGNOSIS — I519 Heart disease, unspecified: Secondary | ICD-10-CM

## 2021-09-20 DIAGNOSIS — J4540 Moderate persistent asthma, uncomplicated: Secondary | ICD-10-CM

## 2021-09-20 NOTE — Patient Instructions (Signed)
I hope they can make progress for you with your heart and your esophagus. ? ?Please call if we can help ?

## 2021-09-24 DIAGNOSIS — K449 Diaphragmatic hernia without obstruction or gangrene: Secondary | ICD-10-CM | POA: Diagnosis not present

## 2021-09-25 ENCOUNTER — Encounter (HOSPITAL_COMMUNITY): Payer: Self-pay

## 2021-09-25 ENCOUNTER — Emergency Department (INDEPENDENT_AMBULATORY_CARE_PROVIDER_SITE_OTHER): Payer: Medicare Other

## 2021-09-25 ENCOUNTER — Emergency Department (INDEPENDENT_AMBULATORY_CARE_PROVIDER_SITE_OTHER)
Admission: EM | Admit: 2021-09-25 | Discharge: 2021-09-25 | Disposition: A | Discharge status: Home / Self Care | Payer: Medicare Other | Source: Home / Self Care

## 2021-09-25 ENCOUNTER — Telehealth: Payer: Self-pay | Admitting: *Deleted

## 2021-09-25 ENCOUNTER — Emergency Department (HOSPITAL_COMMUNITY)
Admission: EM | Admit: 2021-09-25 | Discharge: 2021-09-26 | Disposition: A | Discharge status: Home / Self Care | Payer: Medicare Other | Attending: Emergency Medicine | Admitting: Emergency Medicine

## 2021-09-25 ENCOUNTER — Other Ambulatory Visit: Payer: Self-pay

## 2021-09-25 ENCOUNTER — Emergency Department: Payer: Medicare Other

## 2021-09-25 ENCOUNTER — Emergency Department (HOSPITAL_COMMUNITY): Payer: Medicare Other

## 2021-09-25 DIAGNOSIS — Z7901 Long term (current) use of anticoagulants: Secondary | ICD-10-CM | POA: Insufficient documentation

## 2021-09-25 DIAGNOSIS — R1084 Generalized abdominal pain: Secondary | ICD-10-CM

## 2021-09-25 DIAGNOSIS — R600 Localized edema: Secondary | ICD-10-CM | POA: Diagnosis not present

## 2021-09-25 DIAGNOSIS — R109 Unspecified abdominal pain: Secondary | ICD-10-CM | POA: Diagnosis not present

## 2021-09-25 DIAGNOSIS — N281 Cyst of kidney, acquired: Secondary | ICD-10-CM | POA: Insufficient documentation

## 2021-09-25 DIAGNOSIS — I4891 Unspecified atrial fibrillation: Secondary | ICD-10-CM | POA: Insufficient documentation

## 2021-09-25 DIAGNOSIS — I700 Atherosclerosis of aorta: Secondary | ICD-10-CM | POA: Diagnosis not present

## 2021-09-25 DIAGNOSIS — R1031 Right lower quadrant pain: Secondary | ICD-10-CM | POA: Diagnosis not present

## 2021-09-25 DIAGNOSIS — R110 Nausea: Secondary | ICD-10-CM | POA: Diagnosis not present

## 2021-09-25 DIAGNOSIS — I509 Heart failure, unspecified: Secondary | ICD-10-CM | POA: Diagnosis not present

## 2021-09-25 DIAGNOSIS — K5730 Diverticulosis of large intestine without perforation or abscess without bleeding: Secondary | ICD-10-CM | POA: Diagnosis not present

## 2021-09-25 DIAGNOSIS — K5900 Constipation, unspecified: Secondary | ICD-10-CM | POA: Diagnosis not present

## 2021-09-25 LAB — CBC
HCT: 39.6 % (ref 39.0–52.0)
Hemoglobin: 13.2 g/dL (ref 13.0–17.0)
MCH: 32.5 pg (ref 26.0–34.0)
MCHC: 33.3 g/dL (ref 30.0–36.0)
MCV: 97.5 fL (ref 80.0–100.0)
Platelets: 179 10*3/uL (ref 150–400)
RBC: 4.06 MIL/uL — ABNORMAL LOW (ref 4.22–5.81)
RDW: 14.9 % (ref 11.5–15.5)
WBC: 6.2 10*3/uL (ref 4.0–10.5)
nRBC: 0 % (ref 0.0–0.2)

## 2021-09-25 LAB — URINALYSIS, ROUTINE W REFLEX MICROSCOPIC
Bilirubin Urine: NEGATIVE
Glucose, UA: NEGATIVE mg/dL
Hgb urine dipstick: NEGATIVE
Ketones, ur: NEGATIVE mg/dL
Leukocytes,Ua: NEGATIVE
Nitrite: NEGATIVE
Protein, ur: NEGATIVE mg/dL
Specific Gravity, Urine: 1.016 (ref 1.005–1.030)
pH: 6 (ref 5.0–8.0)

## 2021-09-25 LAB — COMPREHENSIVE METABOLIC PANEL
ALT: 13 U/L (ref 0–44)
AST: 17 U/L (ref 15–41)
Albumin: 4 g/dL (ref 3.5–5.0)
Alkaline Phosphatase: 88 U/L (ref 38–126)
Anion gap: 9 (ref 5–15)
BUN: 17 mg/dL (ref 8–23)
CO2: 25 mmol/L (ref 22–32)
Calcium: 9.4 mg/dL (ref 8.9–10.3)
Chloride: 105 mmol/L (ref 98–111)
Creatinine, Ser: 0.76 mg/dL (ref 0.61–1.24)
GFR, Estimated: >60 mL/min (ref >60)
Glucose, Bld: 92 mg/dL (ref 70–99)
Potassium: 3.6 mmol/L (ref 3.5–5.1)
Sodium: 139 mmol/L (ref 135–145)
Total Bilirubin: 0.9 mg/dL (ref 0.3–1.2)
Total Protein: 6.6 g/dL (ref 6.5–8.1)

## 2021-09-25 LAB — LIPASE, BLOOD: Lipase: 26 U/L (ref 11–51)

## 2021-09-25 MED ORDER — IOHEXOL 300 MG/ML  SOLN
100.0000 mL | Freq: Once | INTRAMUSCULAR | Status: AC | PRN
Start: 2021-09-25 — End: 2021-09-25
  Administered 2021-09-25: 100 mL via INTRAVENOUS

## 2021-09-25 NOTE — Telephone Encounter (Signed)
Pt agreeable to plan of care for pre op in office appt 09/26/21 @ 12:15 with Ermalinda Barrios, PAC. Pt wants to keep his appt with [name] 11/20/21 as well. I will send FYI to requesting office pt has appt 09/26/21.  ?

## 2021-09-25 NOTE — ED Provider Triage Note (Signed)
Emergency Medicine Provider Triage Evaluation Note ? ?Levi [name] , a 76 y.o. male  was evaluated in triage.  Pt complains of RLQ abdominal pain and nausea x 2 days. Went to UC earlier today and was discharged in stable condition. Was told if symptoms got worse or if he developed a fever to go to the ER for CT. Last BM yesterday.  ? ?Review of Systems  ?Positive: Abdominal pain, nausea ?Negative: Fever, vomiting, Diarrhea, blood in stool ? ?Physical Exam  ?Ht '6\' 2"'$  (1.88 m)   Wt 83.3 kg   BMI 23.57 kg/m?  ?Gen:   Awake, no distress   ?Resp:  Normal effort  ?MSK:   Moves extremities without difficulty  ?Other:   ? ?Medical Decision Making  ?Medically screening exam initiated at 6:21 PM.  Appropriate orders placed.  Levi [name] was informed that the remainder of the evaluation will be completed by another provider, this initial triage assessment does not replace that evaluation, and the importance of remaining in the ED until their evaluation is complete. ? ?Will order labs and CT ?  ?Kateri Plummer, PA-C ?09/25/21 1821 ? ?

## 2021-09-25 NOTE — Progress Notes (Addendum)
? ?Cardiology Office Note   ? ?Date:  09/26/2021  ? ?ID:  Levi [name], DOB 16-Aug-1945, MRN [id number] ? ? ?PCP:  Shon Baton, MD ?  ?[place]  ?Cardiologist:  Candee Furbish, MD   ?Advanced Practice Provider:  No care team member to display ?Electrophysiologist:  Vickie Epley, MD  ? ?[id number]}  ? ?Chief Complaint  ?Patient presents with  ? Pre-op Exam  ? ? ?History of Present Illness:  ?Levi [name] is a 76 y.o. male with a hx of atrial fibrillation, HTN, hyperlipidemia, COPD, agent orange exposure, lumbar fusion, and neuropathy. ?  ?He had cardiac catheterization in 2010 showed no flow-limiting CAD. He developed atrial fibrillation in 2013 which was a brief episodes and reoccurred in 2016. ? ?He was scheduled for left atrial appendage occlusion (Watch[place]) 02/22/21. Watchman device was attempted however interatrial septum was highly redundant and transseptal puncture was too close to the outside wall of the left atrium to be safely performed. Therefore watchman attempt was aborted.  ? ?Patient is scheduled for preoperative clearance for robotic NISSEN 11/01/21 at [facility] by Dr. Reino Bellis.  Pharmacy is reviewed his Eliquis and he can hold it for 2 days prior to the surgery.He was last seen by [name] on 08/08/21 for PAF. He reported infrequent episodes of atrial fibrillation, however he was having significant lower extremity edema. LVEF only mildly reduced 45-50% by echo 01/2021. He was advised to increase furosemide 40 mg twice daily then 40 mg daily up from 20 mg daily and return in 1 week for follow-up.  ? ?Patient saw Stephan Minister, NP 08/15/2021 at which time he had significant improvement in leg edema. ? ?Patient comes in for f/u. Breathing ok but chronic leg swelling. Goes down at night when lays down. Eats a lot of frozen meat substitute. Also on chronic steroids for lungs. Denies chest pain. Has occasional heart skipping but not racing. Goes to a gym daily and  rides elliptical for an hours. ? ? ?Past Medical History:  ?Diagnosis Date  ? Agent orange exposure 1970's  ? takes Imdur,Diltiazem, and Atacand daily  ? ALLERGIC RHINITIS   ? Allergy   ? seasonal allergies  ? Asthma   ? uses inhaler and nebulizers  ? Bruises easily   ? d/t prednisone daily  ? Cataract   ? CHF (congestive heart failure) ([place])   ? dx-on meds to treat  ? Chronic bronchitis ([place])   ? "used to get it q yr; last time ?2008" (04/28/2012)  ? COPD (chronic obstructive pulmonary disease) ([place])   ? agent orange exposure  ? Degenerative disk disease   ? "everywhere" (04/28/2012)  ? Diverticulitis   ? Dysrhythmia   ? afib  ? Elevated uric acid in blood   ? takes Allopurinol daily  ? Emphysema of lung ([place])   ? Enlarged prostate   ? but not on any meds  ? GERD (gastroesophageal reflux disease)   ? takes Protonix daily  ? H/O hiatal hernia   ? Headache   ? History of pneumonia   ? a. 2010  ? Hyperlipidemia   ? takes Lipitor daily; pt. states he takes a preventive  ? Hypothyroidism   ? Joint pain   ? Joint swelling   ? Neuromuscular disorder ([place])   ? bilat legs, and bilat arms  ? PAF (paroxysmal atrial fibrillation) ([place])   ? a. Dx 04/2012, CHA2DS2VASc = 1 (age);  b. 04/2012 Echo: EF 40-50%, mild  MR.  ? Personal history of colonic adenomas 02/09/2013  ? Pneumonia   ? Shortness of breath dyspnea   ? Thyroid disease   ? no taking meds-caused dizziness  ? ? ?Past Surgical History:  ?Procedure Laterality Date  ? 57 HOUR [place] STUDY N/A 05/30/2021  ? Procedure: [place] STUDY;  Surgeon: Lavena Bullion, DO;  Location: [facility];  Service: Gastroenterology;  Laterality: N/A;  ? ANTERIOR CERVICAL DECOMP/DISCECTOMY FUSION N/A 07/21/2012  ? Procedure: ANTERIOR CERVICAL DECOMPRESSION/DISCECTOMY FUSION 2 LEVELS;  Surgeon: [name], MD;  Location: [place] [facility];  Service: Neurosurgery;  Laterality: N/A;  C4-5 C5-6 Anterior cervical decompression/diskectomy/fusion  ? ANTERIOR LAT LUMBAR FUSION N/A 04/07/2017  ? Procedure:  Thoracic twelve-Lumbar one Anterolateral decompression/fusion;  Surgeon: [name], MD;  Location: [place];  Service: Neurosurgery;  Laterality: N/A;  ? BACK SURGERY    ? x 3  ? CARDIAC CATHETERIZATION  11/2008  ? [name] - 20% calcified non flow limiting left main, 50% EF apical hypokinesis  ? CERVICAL DISC ARTHROPLASTY N/A 04/07/2017  ? Procedure: Cervical six-seven Disc arthroplasty;  Surgeon: [name], MD;  Location: [place];  Service: Neurosurgery;  Laterality: N/A;  ? CHEST TUBE INSERTION Left 04/11/2017  ? Procedure: CHEST TUBE INSERTION;  Surgeon: [name], MD;  Location: [place];  Service: Thoracic;  Laterality: Left;  ? CHOLECYSTECTOMY    ? COLONOSCOPY  2016  ? CG-MAC-miralax(good)servere TICS/TA x 2  ? ESOPHAGEAL MANOMETRY N/A 05/30/2021  ? Procedure: ESOPHAGEAL MANOMETRY (EM);  Surgeon: Lavena Bullion, DO;  Location: [facility];  Service: Gastroenterology;  Laterality: N/A;  ph impedence  ? ESOPHAGOGASTRODUODENOSCOPY    ? EYE SURGERY Bilateral 2011  ? steroidal encapsulation" (04/28/2012)  ? FOOT SURGERY Right   ? x 2  ? KNEE ARTHROSCOPY Bilateral   ? LATERAL / POSTERIOR COMBINED FUSION LUMBAR SPINE  2012  ? LEFT ATRIAL APPENDAGE OCCLUSION N/A 02/22/2021  ? Procedure: LEFT ATRIAL APPENDAGE OCCLUSION;  Surgeon: Vickie Epley, MD;  Location: [place] [facility];  Service: Cardiovascular;  Laterality: N/A;  ? POLYPECTOMY  2016  ? severe TICS/TA x 2  ? POSTERIOR CERVICAL LAMINECTOMY WITH MET- RX Right 06/11/2021  ? Procedure: Right Cervical six-seven  Laminectomy and foraminotomy with metrex;  Surgeon: [name], MD;  Location: [place];  Service: Neurosurgery;  Laterality: Right;  ? REVERSE SHOULDER ARTHROPLASTY  04/28/2012  ? Procedure: REVERSE SHOULDER ARTHROPLASTY;  Surgeon: [name], MD;  Location: [place];  Service: Orthopedics;  Laterality: Left;  Left shouder reverse total shoulder arthroplasty  ? SHOULDER SURGERY    ? TEE WITHOUT CARDIOVERSION N/A 02/22/2021  ?  Procedure: TRANSESOPHAGEAL ECHOCARDIOGRAM (TEE);  Surgeon: Vickie Epley, MD;  Location: [place] [facility];  Service: Cardiovascular;  Laterality: N/A;  ? TENDON REPAIR Right 08/07/2020  ? Procedure: RIGHT HAND LIGAMENT RECONSTRUCTION AND TENDON INTERPOSITION;  Surgeon: [name], MD;  Location: [place];  Service: Orthopedics;  Laterality: Right;  ? [place]  ? TOTAL HIP ARTHROPLASTY Left 2011  ? "left" (04/28/2012)  ? TOTAL HIP ARTHROPLASTY Right 07/31/2015  ? Procedure: TOTAL HIP ARTHROPLASTY ANTERIOR APPROACH;  Surgeon: [name], MD;  Location: [place];  Service: Orthopedics;  Laterality: Right;  ? TOTAL KNEE ARTHROPLASTY  01/10/2012  ? Procedure: TOTAL KNEE ARTHROPLASTY;  Surgeon: [name], MD;  Location: [place];  Service: Orthopedics;;  left total knee arthroplasty  ? TOTAL KNEE ARTHROPLASTY Right 06/17/2014  ? Procedure:  TOTAL KNEE ARTHROPLASTY;  Surgeon: [name], MD;  Location: [place];  Service: Orthopedics;  Laterality: Right;  ? ? ?Current Medications: ?Current Meds  ?Medication Sig  ? albuterol (VENTOLIN HFA) 108 (90 Base) MCG/ACT inhaler INHALE 2 PUFFS INTO THE LUNGS EVERY 4 (FOUR) HOURS AS NEEDED FOR WHEEZING OR SHORTNESS OF BREATH.  ? allopurinol (ZYLOPRIM) 100 MG tablet Take 100 mg by mouth daily.  ? atorvastatin (LIPITOR) 20 MG tablet TAKE 1 TABLET BY MOUTH EVERY DAY  ? candesartan (ATACAND) 4 MG tablet TAKE 1/2 TABLETS (2 MG TOTAL) BY MOUTH DAILY.  ? cyclobenzaprine (FLEXERIL) 10 MG tablet Take 1 tablet (10 mg total) by mouth 3 (three) times daily as needed for muscle spasms.  ? diclofenac Sodium (VOLTAREN) 1 % GEL Apply 2 g topically daily as needed (Pain).  ? diltiazem (CARDIZEM CD) 180 MG 24 hr capsule TAKE 1 CAPSULE BY MOUTH EVERY DAY  ? diltiazem (CARDIZEM) 30 MG tablet Take 30 mg by mouth as needed (heart rate over 120).  ? DULoxetine (CYMBALTA) 30 MG capsule Take 1 capsule (30 mg total) by mouth daily.  ? ELIQUIS 5 MG TABS tablet TAKE  1 TABLET BY MOUTH TWICE A DAY  ? furosemide (LASIX) 40 MG tablet Take 1 tablet (40 mg total) by mouth daily. As directed  ? ipratropium-albuterol (DUONEB) 0.5-2.5 (3) MG/3ML SOLN 1 NEBULE EVERY 6 HOURS AS NEEDED

## 2021-09-25 NOTE — Telephone Encounter (Signed)
? ?  Name: Levi [name]  ?DOB: Jun 02, 1945  ?MRN: [id number] ? ?Primary Cardiologist: Candee Furbish, MD ? ?Chart reviewed as part of pre-operative protocol coverage. Because of Levi [name] past medical history and time since last visit, he will require a follow-up in-office visit in order to better assess preoperative cardiovascular risk. ? ?Pre-op covering staff: ?- Please schedule appointment and call patient to inform them. If patient already had an upcoming appointment within acceptable timeframe, please add "pre-op clearance" to the appointment notes so provider is aware. ?- Please contact requesting surgeon's office via preferred method (i.e, phone, fax) to inform them of need for appointment prior to surgery. ? ?This message will also be routed to pharmacy pool  for input on holding Eliquis as requested below so that this information is available to the clearing provider at time of patient's appointment.  ? ?Jimmy Picket, NP  ?09/25/2021, 10:36 AM   ?

## 2021-09-25 NOTE — ED Triage Notes (Signed)
Patient reports RLQ abdominal pain and nausea x 2 days. Patient states the pain worsened today. ?Patient went to a [facility] in [place] and was sent to the ED for a CT abdomen. ?

## 2021-09-25 NOTE — Telephone Encounter (Signed)
? ?  Pre-operative Risk Assessment  ?  ?Patient Name: Levi [name]  ?DOB: [birth year] ?MRN: [id number]  ? ?  ? ?Request for Surgical Clearance   ? ?Procedure:   ROBOTIC NISSEN ? ?Date of Surgery:  Clearance 11/01/21                              ?   ?Surgeon:  DR. Reino Bellis ?Surgeon's Group or Practice Name:  [facility] ?Phone number:  [phone] ?Fax number:  [phone] ?  ?Type of Clearance Requested:   ?- Medical  ?- Pharmacy:  Hold Apixaban (Eliquis)   ?  ?Type of Anesthesia:  General  ?  ?Additional requests/questions:   ? ?Signed, ?Julaine Hua   ?09/25/2021, 10:05 AM  ? ?

## 2021-09-25 NOTE — ED Provider Notes (Signed)
?[facility] DEPT ?Provider Note ? ? ?CSN: [id number] ?Arrival date & time: 09/25/21  1652 ? ?  ? ?History ? ?Chief Complaint  ?Patient presents with  ? Abdominal Pain  ? ? ?Levi [name] is a 76 y.o. male. ? ?HPI ?76 year old male with a history of atrial fibrillation on Eliquis, emphysema, CHF, GERD presents to the ER with complaints of 2 days of right lower quadrant pain and nausea.  Patient states the pain waxes and wanes.  No vomiting.  He had a bowel movement yesterday, reports a history of diverticulitis.  He does still have his appendix but did have his gallbladder out.  He has chronic lower extremity edema and takes 80 mg of Lasix daily.  He denies any fevers or chills.  No dysuria, hematuria, no flank pain.  Denies any chest pain, states he feels short of breath from his COPD but no more than baseline.  He admits that he is on the elliptical for an hour daily and is not sure if he might of pulled a muscle.  He was seen at urgent care in [place] and sent to the ER for rule out of appendicitis. ?  ? ?Home Medications ?Prior to Admission medications   ?Medication Sig Start Date End Date Taking? Authorizing Provider  ?albuterol (VENTOLIN HFA) 108 (90 Base) MCG/ACT inhaler INHALE 2 PUFFS INTO THE LUNGS EVERY 4 (FOUR) HOURS AS NEEDED FOR WHEEZING OR SHORTNESS OF BREATH. 10/13/20   Baird Lyons D, MD  ?allopurinol (ZYLOPRIM) 100 MG tablet Take 100 mg by mouth daily.    [provider]  ?atorvastatin (LIPITOR) 20 MG tablet TAKE 1 TABLET BY MOUTH EVERY DAY 04/05/21   Jerline Pain, MD  ?candesartan (ATACAND) 4 MG tablet TAKE 1/2 TABLETS (2 MG TOTAL) BY MOUTH DAILY. 02/26/21   Jerline Pain, MD  ?cyclobenzaprine (FLEXERIL) 10 MG tablet Take 1 tablet (10 mg total) by mouth 3 (three) times daily as needed for muscle spasms. 06/11/21   [name], MD  ?diclofenac Sodium (VOLTAREN) 1 % GEL Apply 2 g topically daily as needed (Pain).    [provider]  ?diltiazem  (CARDIZEM CD) 180 MG 24 hr capsule TAKE 1 CAPSULE BY MOUTH EVERY DAY 01/30/21   Jerline Pain, MD  ?diltiazem (CARDIZEM) 30 MG tablet Take 30 mg by mouth as needed (heart rate over 120).    [provider]  ?DULoxetine (CYMBALTA) 30 MG capsule Take 1 capsule (30 mg total) by mouth daily. 09/04/21   [name], MD  ?ELIQUIS 5 MG TABS tablet TAKE 1 TABLET BY MOUTH TWICE A DAY 06/18/21   Jerline Pain, MD  ?furosemide (LASIX) 40 MG tablet Take 1 tablet (40 mg total) by mouth daily. As directed 08/08/21   Jerline Pain, MD  ?ipratropium-albuterol (DUONEB) 0.5-2.5 (3) MG/3ML SOLN 1 NEBULE EVERY 6 HOURS AS NEEDED. **J45.3** 03/22/20   Young, Tarri Fuller D, MD  ?isosorbide mononitrate (IMDUR) 30 MG 24 hr tablet TAKE 1 TABLET (30 MG TOTAL) BY MOUTH DAILY. 02/07/21   Jerline Pain, MD  ?Nebulizers (COMPRESSOR/NEBULIZER) MISC Use as directed 12/01/17   Deneise Lever, MD  ?oxyCODONE-acetaminophen (PERCOCET/ROXICET) 5-325 MG tablet Take 1-2 tablets by mouth 2 (two) times daily as needed for severe pain. 07/24/21   Raylene Everts, MD  ?potassium chloride SA (KLOR-CON M20) 20 MEQ tablet Take 1 tablet (20 mEq total) by mouth daily. 09/05/21   Jerline Pain, MD  ?predniSONE (DELTASONE) 10 MG tablet TAKE 2  TABLETS (20 MG TOTAL) BY MOUTH DAILY. CONTINUOUS 04/20/21   Baird Lyons D, MD  ?RABEprazole (ACIPHEX) 20 MG tablet Take 1 tablet (20 mg total) by mouth daily. 09/10/21   Cirigliano, Vito V, DO  ?STRIVERDI RESPIMAT 2.5 MCG/ACT AERS INHALE 2 PUFFS BY MOUTH INTO THE LUNGS DAILY 02/25/21   Baird Lyons D, MD  ?tamsulosin (FLOMAX) 0.4 MG CAPS capsule Take 0.4 mg by mouth at bedtime.  10/13/15   [provider]  ?   ? ?Allergies    ?Morphine and related and Levaquin [levofloxacin]   ? ?Review of Systems   ?Review of Systems ?Ten systems reviewed and are negative for acute change, except as noted in the HPI.  ? ?Physical Exam ?Updated Vital Signs ?BP 138/69   Pulse 61   Temp 97.7 ?F (36.5 ?C) (Oral)   Resp 19   Ht  '6\' 2"'$  (1.88 m)   Wt 83.3 kg   SpO2 100%   BMI 23.57 kg/m?  ?Physical Exam ?Vitals and nursing note reviewed.  ?Constitutional:   ?   General: He is not in acute distress. ?   Appearance: He is well-developed.  ?HENT:  ?   Head: Normocephalic and atraumatic.  ?Eyes:  ?   Conjunctiva/sclera: Conjunctivae normal.  ?Cardiovascular:  ?   Rate and Rhythm: Normal rate. Rhythm irregular.  ?   Heart sounds: No murmur heard. ?   Comments: A-fib on the monitor, rate controlled in the 60s ?Pulmonary:  ?   Effort: Pulmonary effort is normal. No respiratory distress.  ?   Breath sounds: Normal breath sounds.  ?Abdominal:  ?   Palpations: Abdomen is soft.  ?   Tenderness: There is no abdominal tenderness. There is no right CVA tenderness or left CVA tenderness.  ?   Comments: Minimal right lower quadrant tenderness, no visible rashes, no guarding, abdomen is soft, no peritoneal signs, no CVA tenderness bilaterally  ?Musculoskeletal:     ?   General: No swelling.  ?   Cervical back: Neck supple.  ?   Comments: 2+ bilateral lower extremity edema  ?Skin: ?   General: Skin is warm and dry.  ?   Capillary Refill: Capillary refill takes less than 2 seconds.  ?Neurological:  ?   Mental Status: He is alert.  ?Psychiatric:     ?   Mood and Affect: Mood normal.  ? ? ?ED Results / Procedures / Treatments   ?Labs ?(all labs ordered are listed, but only abnormal results are displayed) ?Labs Reviewed  ?CBC - Abnormal; Notable for the following components:  ?    Result Value  ? RBC 4.06 (*)   ? All other components within normal limits  ?LIPASE, BLOOD  ?COMPREHENSIVE METABOLIC PANEL  ?URINALYSIS, ROUTINE W REFLEX MICROSCOPIC  ? ? ?EKG ?None ? ?Radiology ?US Abdomen Complete ? ?Result Date: 09/25/2021 ?CLINICAL DATA:  Intermittent abdominal pain for 1 month EXAM: ABDOMEN ULTRASOUND COMPLETE COMPARISON:  CT abdomen pelvis 02/13/2012 FINDINGS: Gallbladder: Status post cholecystectomy Common bile duct: Diameter: 10 mm Liver: No focal lesion  identified. Within normal limits in parenchymal echogenicity. Portal vein is patent on color Doppler imaging with normal direction of blood flow towards the liver. IVC: No abnormality visualized. Pancreas: Visualized portion unremarkable. Spleen: Size and appearance within normal limits. Right Kidney: Length: 11.5 cm. Echogenicity within normal limits. No mass or hydronephrosis visualized. Left Kidney: Length: 12.4 cm. Echogenicity within normal limits. No mass or hydronephrosis visualized. 5.5 cm simple cyst seen in the lower pole.  Abdominal aorta: No aneurysm visualized. Other findings: None. IMPRESSION: No acute sonographic abnormality of the abdomen. Electronically Signed   By: Miachel Roux M.D.   On: 09/25/2021 11:54  ? ?CT ABDOMEN PELVIS W CONTRAST ? ?Result Date: 09/25/2021 ?CLINICAL DATA:  Right lower abdominal pain. EXAM: CT ABDOMEN AND PELVIS WITH CONTRAST TECHNIQUE: Multidetector CT imaging of the abdomen and pelvis was performed using the standard protocol following bolus administration of intravenous contrast. RADIATION DOSE REDUCTION: This exam was performed according to the departmental dose-optimization program which includes automated exposure control, adjustment of the mA and/or kV according to patient size and/or use of iterative reconstruction technique. CONTRAST:  130m OMNIPAQUE IOHEXOL 300 MG/ML  SOLN COMPARISON:  CT with IV and oral contrast 02/13/2012, CT pelvis only without contrast 07/15/2019. FINDINGS: Lower chest: The cardiac size is normal. Lung bases are clear of infiltrates with scattered linear scarring or atelectasis noted and there are multilevel subacute partially healed fractures of the bilateral ribs, more on the left. Hepatobiliary: 17 cm in length mildly steatotic liver. No mass enhancement. Surgically absent gallbladder with slightly prominent common bile duct, unchanged. Pancreas: There is partial atrophy of the head and uncinate process portions, progressive from 2013. There  is no mass enhancement or ductal dilatation. Spleen: No mass enhancement or splenomegaly. Adrenals/Urinary Tract: There is no adrenal mass. There is a 6.2 cm cyst in the inferior pole of the left kidney which was pre

## 2021-09-25 NOTE — Telephone Encounter (Signed)
Patient with diagnosis of afib on Eliquis for anticoagulation.   ? ?Procedure:  ROBOTIC NISSEN ?Date of procedure: 11/01/21 ? ?CHA2DS2-VASc Score = 3  ? This indicates a 3.2% annual risk of stroke. ?The patient's score is based upon: ?CHF History: 1 ?HTN History: 1 ?Diabetes History: 0 ?Stroke History: 0 ?Vascular Disease History: 0 ?Age Score: 1 ?Gender Score: 0 ?  ?  ? ?CrCl 82 ml/min ? ?Per office protocol, patient can hold Eliquis for 2 days prior to procedure.   ? ? ?

## 2021-09-25 NOTE — Discharge Instructions (Addendum)
Advised patient of today's complete ultrasound of abdomen results with hard copy provided to patient.  Advised patient if right lower abdominal pain worsens and is accompanied by fever, nausea, vomiting go to nearest ED immediately for further evaluation.  Advised patient if right abdominal pain persists and is not acute may follow-up with [place] on Monday, 10/01/2021 for further evaluation of right lower quadrant abdominal pain via CT of abdomen and pelvis with contrast. ?

## 2021-09-25 NOTE — ED Notes (Signed)
Patient has a urine culture in the lab ?

## 2021-09-25 NOTE — ED Triage Notes (Signed)
Pt c/o lower RT adb pain x 2 days. Pain was intermittent but worsening today. Last BM was yesterday. Hx of diverticulitis but says this feels different. Does still have appendix. Pain 8/10 when pressing on abd. ?

## 2021-09-25 NOTE — ED Provider Notes (Signed)
?[facility] ? ? ? ?CSN: [id number] ?Arrival date & time: 09/25/21  4098 ? ? ?  ? ?History   ?Chief Complaint ?Chief Complaint  ?Patient presents with  ? Abdominal Pain  ?  LRQ  ? ? ?HPI ?Levi [name] is a 76 y.o. male.  ? ?HPI pleasant 76 year old male presents with right abdominal pain for 2 days.  Pain is intermittent but worsening today.  Bowel movement was yesterday and has history of diverticulitis.  Patient still has his appendix.  PMH significant for A-fib, left ventricular dysfunction, lower extremity edema, and COPD ? ?Past Medical History:  ?Diagnosis Date  ? Agent orange exposure 1970's  ? takes Imdur,Diltiazem, and Atacand daily  ? ALLERGIC RHINITIS   ? Allergy   ? seasonal allergies  ? Asthma   ? uses inhaler and nebulizers  ? Bruises easily   ? d/t prednisone daily  ? Cataract   ? CHF (congestive heart failure) ([place])   ? dx-on meds to treat  ? Chronic bronchitis ([place])   ? "used to get it q yr; last time ?2008" (04/28/2012)  ? COPD (chronic obstructive pulmonary disease) ([place])   ? agent orange exposure  ? Degenerative disk disease   ? "everywhere" (04/28/2012)  ? Diverticulitis   ? Dysrhythmia   ? afib  ? Elevated uric acid in blood   ? takes Allopurinol daily  ? Emphysema of lung ([place])   ? Enlarged prostate   ? but not on any meds  ? GERD (gastroesophageal reflux disease)   ? takes Protonix daily  ? H/O hiatal hernia   ? Headache   ? History of pneumonia   ? a. 2010  ? Hyperlipidemia   ? takes Lipitor daily; pt. states he takes a preventive  ? Hypothyroidism   ? Joint pain   ? Joint swelling   ? Neuromuscular disorder ([place])   ? bilat legs, and bilat arms  ? PAF (paroxysmal atrial fibrillation) ([place])   ? a. Dx 04/2012, CHA2DS2VASc = 1 (age);  b. 04/2012 Echo: EF 40-50%, mild MR.  ? Personal history of colonic adenomas 02/09/2013  ? Pneumonia   ? Shortness of breath dyspnea   ? Thyroid disease   ? no taking meds-caused dizziness  ? ? ?Patient Active Problem List  ? Diagnosis Date Noted  ? Lower  extremity edema 08/08/2021  ? Heartburn   ? Atypical chest pain   ? Cervical radicular pain 06/11/2021  ? Chronic anticoagulation 03/15/2021  ? Peripheral neuropathy 02/22/2021  ? LV dysfunction 02/22/2021  ? Essential (primary) hypertension 11/15/2020  ? Arthritis of carpometacarpal [facility]) joint of right thumb 08/07/2020  ? Cellulitis 03/22/2020  ? COPD (chronic obstructive pulmonary disease) ([place]) 03/21/2020  ? Chest wall pain 09/21/2019  ? GERD (gastroesophageal reflux disease) 12/03/2017  ? Pneumothorax, left 04/11/2017  ? Lumbar stenosis with neurogenic claudication 04/07/2017  ? Primary osteoarthritis of right hip 07/31/2015  ? Primary osteoarthritis of right knee 06/17/2014  ? Thrush of mouth and esophagus ([place]) 04/03/2014  ? Palpitation 03/28/2014  ? Pseudoarthrosis of lumbar spine 06/15/2013  ? Hyperlipidemia   ? History of colonic polyps 02/09/2013  ? Atrial fibrillation ([place]) 05/11/2012  ? Osteoarthritis of left knee 01/10/2012  ? Seasonal and perennial allergic rhinitis 07/23/2010  ? Asthma, moderate persistent 03/06/2009  ? ? ?Past Surgical History:  ?Procedure Laterality Date  ? 28 HOUR [place] STUDY N/A 05/30/2021  ? Procedure: [place] STUDY;  Surgeon: Lavena Bullion, DO;  Location: [facility]  ENDOSCOPY;  Service: Gastroenterology;  Laterality: N/A;  ? ANTERIOR CERVICAL DECOMP/DISCECTOMY FUSION N/A 07/21/2012  ? Procedure: ANTERIOR CERVICAL DECOMPRESSION/DISCECTOMY FUSION 2 LEVELS;  Surgeon: [name], MD;  Location: [facility] NEURO ORS;  Service: Neurosurgery;  Laterality: N/A;  C4-5 C5-6 Anterior cervical decompression/diskectomy/fusion  ? ANTERIOR LAT LUMBAR FUSION N/A 04/07/2017  ? Procedure: Thoracic twelve-Lumbar one Anterolateral decompression/fusion;  Surgeon: [name], MD;  Location: [facility];  Service: Neurosurgery;  Laterality: N/A;  ? BACK SURGERY    ? x 3  ? CARDIAC CATHETERIZATION  11/2008  ? [name] - 20% calcified non flow limiting left main, 50% EF apical hypokinesis  ? CERVICAL DISC  ARTHROPLASTY N/A 04/07/2017  ? Procedure: Cervical six-seven Disc arthroplasty;  Surgeon: [name], MD;  Location: [facility];  Service: Neurosurgery;  Laterality: N/A;  ? CHEST TUBE INSERTION Left 04/11/2017  ? Procedure: CHEST TUBE INSERTION;  Surgeon: [name], MD;  Location: [facility];  Service: Thoracic;  Laterality: Left;  ? CHOLECYSTECTOMY    ? COLONOSCOPY  2016  ? CG-MAC-miralax(good)servere TICS/TA x 2  ? ESOPHAGEAL MANOMETRY N/A 05/30/2021  ? Procedure: ESOPHAGEAL MANOMETRY (EM);  Surgeon: Lavena Bullion, DO;  Location: [facility] ENDOS[facility];  Service: Gastroenterology;  Laterality: N/A;  ph impedence  ? ESOPHAGOGASTRODUODENOSCOPY    ? EYE SURGERY Bilateral 2011  ? steroidal encapsulation" (04/28/2012)  ? FOOT SURGERY Right   ? x 2  ? KNEE ARTHROSCOPY Bilateral   ? LATERAL / POSTERIOR COMBINED FUSION LUMBAR SPINE  2012  ? LEFT ATRIAL APPENDAGE OCCLUSION N/A 02/22/2021  ? Procedure: LEFT ATRIAL APPENDAGE OCCLUSION;  Surgeon: Vickie Epley, MD;  Location: [facility] CV LAB;  Service: Cardiovascular;  Laterality: N/A;  ? POLYPECTOMY  2016  ? severe TICS/TA x 2  ? POSTERIOR CERVICAL LAMINECTOMY WITH MET- RX Right 06/11/2021  ? Procedure: Right Cervical six-seven  Laminectomy and foraminotomy with metrex;  Surgeon: [name], MD;  Location: [facility];  Service: Neurosurgery;  Laterality: Right;  ? REVERSE SHOULDER ARTHROPLASTY  04/28/2012  ? Procedure: REVERSE SHOULDER ARTHROPLASTY;  Surgeon: [name], MD;  Location: [facility];  Service: Orthopedics;  Laterality: Left;  Left shouder reverse total shoulder arthroplasty  ? SHOULDER SURGERY    ? TEE WITHOUT CARDIOVERSION N/A 02/22/2021  ? Procedure: TRANSESOPHAGEAL ECHOCARDIOGRAM (TEE);  Surgeon: Vickie Epley, MD;  Location: [facility] CV LAB;  Service: Cardiovascular;  Laterality: N/A;  ? TENDON REPAIR Right 08/07/2020  ? Procedure: RIGHT HAND LIGAMENT RECONSTRUCTION AND TENDON INTERPOSITION;  Surgeon: [name], MD;  Location: [facility];  Service: Orthopedics;  Laterality: Right;  ? [place]  ? TOTAL HIP ARTHROPLASTY Left 2011  ? "left" (04/28/2012)  ? TOTAL HIP ARTHROPLASTY Right 07/31/2015  ? Procedure: TOTAL HIP ARTHROPLASTY ANTERIOR APPROACH;  Surgeon: [name], MD;  Location: [facility];  Service: Orthopedics;  Laterality: Right;  ? TOTAL KNEE ARTHROPLASTY  01/10/2012  ? Procedure: TOTAL KNEE ARTHROPLASTY;  Surgeon: [name], MD;  Location: [facility];  Service: Orthopedics;;  left total knee arthroplasty  ? TOTAL KNEE ARTHROPLASTY Right 06/17/2014  ? Procedure: TOTAL KNEE ARTHROPLASTY;  Surgeon: [name], MD;  Location: [facility];  Service: Orthopedics;  Laterality: Right;  ? ? ? ? ? ?Home Medications   ? ?Prior to Admission medications   ?Medication Sig Start Date End Date Taking? Authorizing Provider  ?albuterol (VENTOLIN HFA) 108 (90 Base) MCG/ACT inhaler INHALE 2 PUFFS INTO THE LUNGS EVERY 4 (FOUR) HOURS AS  NEEDED FOR WHEEZING OR SHORTNESS OF BREATH. 10/13/20   Baird Lyons D, MD  ?allopurinol (ZYLOPRIM) 100 MG tablet Take 100 mg by mouth daily.    [provider]  ?atorvastatin (LIPITOR) 20 MG tablet TAKE 1 TABLET BY MOUTH EVERY DAY 04/05/21   Jerline Pain, MD  ?candesartan (ATACAND) 4 MG tablet TAKE 1/2 TABLETS (2 MG TOTAL) BY MOUTH DAILY. 02/26/21   Jerline Pain, MD  ?cyclobenzaprine (FLEXERIL) 10 MG tablet Take 1 tablet (10 mg total) by mouth 3 (three) times daily as needed for muscle spasms. 06/11/21   [name], MD  ?diclofenac Sodium (VOLTAREN) 1 % GEL Apply 2 g topically daily as needed (Pain).    [provider]  ?diltiazem (CARDIZEM CD) 180 MG 24 hr capsule TAKE 1 CAPSULE BY MOUTH EVERY DAY 01/30/21   Jerline Pain, MD  ?diltiazem (CARDIZEM) 30 MG tablet Take 30 mg by mouth as needed (heart rate over 120).    [provider]  ?DULoxetine (CYMBALTA) 30 MG capsule Take 1 capsule (30 mg total) by mouth daily. 09/04/21   [name], MD  ?ELIQUIS 5 MG TABS tablet  TAKE 1 TABLET BY MOUTH TWICE A DAY 06/18/21   Jerline Pain, MD  ?furosemide (LASIX) 40 MG tablet Take 1 tablet (40 mg total) by mouth daily. As directed 08/08/21   Jerline Pain, MD  ?ipratropium-albuterol (DUONEB)

## 2021-09-26 ENCOUNTER — Ambulatory Visit (INDEPENDENT_AMBULATORY_CARE_PROVIDER_SITE_OTHER): Payer: Medicare Other | Admitting: Physician Assistant

## 2021-09-26 ENCOUNTER — Encounter: Payer: Self-pay | Admitting: Physician Assistant

## 2021-09-26 VITALS — BP 116/72 | HR 83 | Ht 74.0 in | Wt 184.8 lb

## 2021-09-26 DIAGNOSIS — R296 Repeated falls: Secondary | ICD-10-CM | POA: Diagnosis not present

## 2021-09-26 DIAGNOSIS — I10 Essential (primary) hypertension: Secondary | ICD-10-CM | POA: Diagnosis not present

## 2021-09-26 DIAGNOSIS — I5022 Chronic systolic (congestive) heart failure: Secondary | ICD-10-CM | POA: Diagnosis not present

## 2021-09-26 DIAGNOSIS — Z01818 Encounter for other preprocedural examination: Secondary | ICD-10-CM | POA: Diagnosis not present

## 2021-09-26 DIAGNOSIS — I2510 Atherosclerotic heart disease of native coronary artery without angina pectoris: Secondary | ICD-10-CM | POA: Diagnosis not present

## 2021-09-26 DIAGNOSIS — I480 Paroxysmal atrial fibrillation: Secondary | ICD-10-CM | POA: Diagnosis not present

## 2021-09-26 MED ORDER — ACETAMINOPHEN 325 MG PO TABS
650.0000 mg | ORAL_TABLET | Freq: Once | ORAL | Status: AC
  Administered 2021-09-26: 650 mg via ORAL
  Filled 2021-09-26: qty 2

## 2021-09-26 NOTE — Discharge Instructions (Addendum)
You were evaluated in the Emergency Department and after careful evaluation, we did not find any emergent condition requiring admission or further testing in the hospital. ? ?Your work-up was overall reassuring.  Your CT scan did not show any signs of emergent causes of your abdominal pain.  I would recommend taking Tylenol and making sure that you follow-up closely with your primary care doctor.  Please return to the ER for any new or worsening symptoms. ? ?Thank you for allowing [place] to be a part of your care. ? ?

## 2021-09-26 NOTE — Patient Instructions (Signed)
Medication Instructions:  ?Your physician has recommended you make the following change in your medication:  ? ?INCREASE: Furosemide (lasix) to '40mg'$  twice daily for 3 days then take '40mg'$  daily ? ?*If you need a refill on your cardiac medications before your next appointment, please call your pharmacy* ? ? ?Lab Work: ?None ?If you have labs (blood work) drawn today and your tests are completely normal, you will receive your results only by: ?MyChart Message (if you have MyChart) OR ?A paper copy in the mail ?If you have any lab test that is abnormal or we need to change your treatment, we will call you to review the results. ? ? ?Follow-Up: ?At [facility], you and your health needs are our priority.  As part of our continuing mission to provide you with exceptional heart care, we have created designated Provider Care Teams.  These Care Teams include your primary Cardiologist (physician) and Advanced Practice Providers (APPs -  Physician Assistants and Nurse Practitioners) who all work together to provide you with the care you need, when you need it. ? ? ?Your next appointment:   ?10/24/2021 ? ?Two Gram Sodium Diet 2000 mg ? ?What is Sodium? Sodium is a mineral found naturally in many foods. The most significant source of sodium in the diet is table salt, which is about 40% sodium.  Processed, convenience, and preserved foods also contain a large amount of sodium.  The body needs only 500 mg of sodium daily to function,  A normal diet provides more than enough sodium even if you do not use salt. ? ?Why Limit Sodium? A build up of sodium in the body can cause thirst, increased blood pressure, shortness of breath, and water retention.  Decreasing sodium in the diet can reduce edema and risk of heart attack or stroke associated with high blood pressure.  Keep in mind that there are many other factors involved in these health problems.  Heredity, obesity, lack of exercise, cigarette smoking, stress and what you eat all  play a role. ? ?General Guidelines: ?Do not add salt at the table or in cooking.  One teaspoon of salt contains over 2 grams of sodium. ?Read food labels ?Avoid processed and convenience foods ?Ask your dietitian before eating any foods not dicussed in the menu planning guidelines ?Consult your physician if you wish to use a salt substitute or a sodium containing medication such as antacids.  Limit milk and milk products to 16 oz (2 cups) per day. ? ?Shopping Hints: ?READ LABELS!! "Dietetic" does not necessarily mean low sodium. ?Salt and other sodium ingredients are often added to foods during processing. ? ? ? ?Menu Planning Guidelines ?Food Group Choose More Often Avoid  ?Beverages (see also the milk group All fruit juices, low-sodium, salt-free vegetables juices, low-sodium carbonated beverages Regular vegetable or tomato juices, commercially softened water used for drinking or cooking  ?Breads and Cereals Enriched white, wheat, rye and pumpernickel bread, hard rolls and dinner rolls; muffins, cornbread and waffles; most dry cereals, cooked cereal without added salt; unsalted crackers and breadsticks; low sodium or homemade bread crumbs Bread, rolls and crackers with salted tops; quick breads; instant hot cereals; pancakes; commercial bread stuffing; self-rising flower and biscuit mixes; regular bread crumbs or cracker crumbs  ?Desserts and Sweets Desserts and sweets mad with mild should be within allowance Instant pudding mixes and cake mixes  ?Fats Butter or margarine; vegetable oils; unsalted salad dressings, regular salad dressings limited to 1 Tbs; light, sour and heavy cream Regular  salad dressings containing bacon fat, bacon bits, and salt pork; snack dips made with instant soup mixes or processed cheese; salted nuts  ?Fruits Most fresh, frozen and canned fruits Fruits processed with salt or sodium-containing ingredient (some dried fruits are processed with sodium sulfites  ? ? ? ? ? ? ?Vegetables Fresh,  frozen vegetables and low- sodium canned vegetables Regular canned vegetables, sauerkraut, pickled vegetables, and others prepared in brine; frozen vegetables in sauces; vegetables seasoned with ham, bacon or salt pork  ?Condiments, Sauces, Miscellaneous ? Salt substitute with physician's approval; pepper, herbs, spices; vinegar, lemon or lime juice; hot pepper sauce; garlic powder, onion powder, low sodium soy sauce (1 Tbs.); low sodium condiments (ketchup, chili sauce, mustard) in limited amounts (1 tsp.) fresh ground horseradish; unsalted tortilla chips, pretzels, potato chips, popcorn, salsa (1/4 cup) Any seasoning made with salt including garlic salt, celery salt, onion salt, and seasoned salt; sea salt, rock salt, kosher salt; meat tenderizers; monosodium glutamate; mustard, regular soy sauce, barbecue, sauce, chili sauce, teriyaki sauce, steak sauce, Worcestershire sauce, and most flavored vinegars; canned gravy and mixes; regular condiments; salted snack foods, olives, picles, relish, horseradish sauce, catsup  ? ?Food preparation: Try these seasonings ?Meats:    ?Pork Sage, onion Serve with applesauce  ?Chicken Poultry seasoning, thyme, parsley Serve with cranberry sauce  ?Lamb Curry powder, rosemary, garlic, thyme Serve with mint sauce or jelly  ?Veal Marjoram, basil Serve with current jelly, cranberry sauce  ?Beef Pepper, bay leaf Serve with dry mustard, unsalted chive butter  ?[place] leaf, dill Serve with unsalted lemon butter, unsalted parsley butter  ?Vegetables:    ?Asparagus Lemon juice   ?Broccoli Lemon juice   ?Carrots Mustard dressing parsley, mint, nutmeg, glazed with unsalted butter and sugar   ?Green beans Marjoram, lemon juice, nutmeg,dill seed   ?Tomatoes Basil, marjoram, onion   ?Spice /blend for "Salt Shaker" 4 tsp ground thyme ?1 tsp ground sage 3 tsp ground rosemary ?4 tsp ground marjoram  ? ?Test your knowledge ?A product that says "Salt Free" may still contain sodium. True or  False ?Garlic Powder and Hot Pepper Sauce an be used as alternative seasonings.True or False ?Processed foods have more sodium than fresh foods.  True or False ?Canned Vegetables have less sodium than froze True or False ? ? ?WAYS TO DECREASE YOUR SODIUM INTAKE ?Avoid the use of added salt in cooking and at the table.  Table salt (and other prepared seasonings which contain salt) is probably one of the greatest sources of sodium in the diet.  Unsalted foods can gain flavor from the sweet, sour, and butter taste sensations of herbs and spices.  Instead of using salt for seasoning, try the following seasonings with the foods listed.  Remember: how you use them to enhance natural food flavors is limited only by your creativity... ?Allspice-Meat, fish, eggs, fruit, peas, red and yellow vegetables ?Almond Extract-Fruit baked goods ?Anise Seed-Sweet breads, fruit, carrots, beets, cottage cheese, cookies (tastes like licorice) ?Basil-Meat, fish, eggs, vegetables, rice, vegetables salads, soups, sauces ?Bay Leaf-Meat, fish, stews, poultry ?Burnet-Salad, vegetables (cucumber-like flavor) ?Caraway Seed-Bread, cookies, cottage cheese, meat, vegetables, cheese, rice ?Cardamon-Baked goods, fruit, soups ?Celery Powder or seed-Salads, salad dressings, sauces, meatloaf, soup, bread.Do not use  celery salt ?Chervil-Meats, salads, fish, eggs, vegetables, cottage cheese (parsley-like flavor) ?Chili Power-Meatloaf, chicken cheese, corn, eggplant, egg dishes ?Chives-Salads cottage cheese, egg dishes, soups, vegetables, sauces ?Cilantro-Salsa, casseroles ?Cinnamon-Baked goods, fruit, pork, lamb, chicken, carrots ?Cloves-Fruit, baked goods, fish, pot roast, green beans, beets,  carrots ?Coriander-Pastry, cookies, meat, salads, cheese (lemon-orange flavor) ?Cumin-Meatloaf, fish,cheese, eggs, cabbage,fruit pie (caraway flavor) ?[employer], fruit, eggs, fish, poultry, cottage cheese, vegetables ?Dill Seed-Meat, cottage cheese, poultry,  vegetables, fish, salads, bread ?Fennel Seed-Bread, cookies, apples, pork, eggs, fish, beets, cabbage, cheese, Licorice-like flavor ?Garlic-(buds or powder) Salads, meat, poultry, fish, bread, butter, veget

## 2021-10-07 NOTE — Assessment & Plan Note (Signed)
Peripheral edema. ?Managed by cardiology ?

## 2021-10-07 NOTE — Assessment & Plan Note (Signed)
Uncomplicated, controlled on current meds ?Symptom overlap with chronic CHF ?

## 2021-10-11 NOTE — Progress Notes (Signed)
Cardiology Office Note    Date:  10/23/2021   ID:  Levi [name], DOB 08/15/1945, MRN [id number]   PCP:  Shon Baton, MD   [place]  Cardiologist:  Candee Furbish, MD   Advanced Practice Provider:  No care team member to display Electrophysiologist:  Vickie Epley, MD   [phone]   No chief complaint on file.   History of Present Illness:  Levi [name] is a 76 y.o. male with a hx of atrial fibrillation, HTN, hyperlipidemia, COPD, agent orange exposure, lumbar fusion, and neuropathy.   He had cardiac catheterization in 2010 showed no flow-limiting CAD. He developed atrial fibrillation in 2013 which was a brief episodes and reoccurred in 2016.   He was scheduled for left atrial appendage occlusion (Watchman) 02/22/21. Watchman device was attempted however interatrial septum was highly redundant and transseptal puncture was too close to the outside wall of the left atrium to be safely performed. Therefore watchman attempt was aborted.    Patient is scheduled for preoperative clearance for robotic NISSEN 11/01/21 at [facility] by Dr. Reino Bellis.  Pharmacy reviewed his Eliquis and he can hold it for 2 days prior to the surgery.He was last seen by [name] on 08/08/21 for PAF. He reported infrequent episodes of atrial fibrillation, however he was having significant lower extremity edema. LVEF only mildly reduced 45-50% by echo 01/2021. He was advised to increase furosemide 40 mg twice daily then 40 mg daily up from 20 mg daily and return in 1 week for follow-up.    Patient saw Stephan Minister, NP 08/15/2021 at which time he had significant improvement in leg edema.  I saw patient 09/26/21 and he was getting a lot of extra salt in his diet with worsening edema. I increased lasix 40 mg bid for 3 days, 2 gm sodium diet. Wanted to see back before he had his surgery.   Patient comes in for f/u. He says swelling is way down but weight the same. Cut back on salt  in diet. No complaints of chest pain, dyspnea, palpitations.    Past Medical History:  Diagnosis Date   Agent orange exposure 1970's   takes Imdur,Diltiazem, and Atacand daily   ALLERGIC RHINITIS    Allergy    seasonal allergies   Asthma    uses inhaler and nebulizers   Bruises easily    d/t prednisone daily   Cataract    CHF (congestive heart failure) (HCC)    dx-on meds to treat   Chronic bronchitis ([place])    "used to get it q yr; last time ?2008" (04/28/2012)   COPD (chronic obstructive pulmonary disease) (HCC)    agent orange exposure   Degenerative disk disease    "everywhere" (04/28/2012)   Diverticulitis    Dysrhythmia    afib   Elevated uric acid in blood    takes Allopurinol daily   Emphysema of lung ([place])    Enlarged prostate    but not on any meds   GERD (gastroesophageal reflux disease)    takes Protonix daily   H/O hiatal hernia    Headache    History of pneumonia    a. 2010   Hyperlipidemia    takes Lipitor daily; pt. states he takes a preventive   Hypothyroidism    Joint pain    Joint swelling    Neuromuscular disorder ([place])    bilat legs, and bilat arms   PAF (paroxysmal atrial fibrillation) ([place])  a. Dx 04/2012, CHA2DS2VASc = 1 (age);  b. 04/2012 Echo: EF 40-50%, mild MR.   Personal history of colonic adenomas 02/09/2013   Pneumonia    Shortness of breath dyspnea    Thyroid disease    no taking meds-caused dizziness    Past Surgical History:  Procedure Laterality Date   61 HOUR [place] STUDY N/A 05/30/2021   Procedure: 24 HOUR [place] STUDY;  Surgeon: Lavena Bullion, DO;  [facility]: [facility];  Service: Gastroenterology;  Laterality: N/A;   ANTERIOR CERVICAL DECOMP/DISCECTOMY FUSION N/A 07/21/2012   Procedure: ANTERIOR CERVICAL DECOMPRESSION/DISCECTOMY FUSION 2 LEVELS;  Surgeon: [name], MD;  Location: [place] [facility];  Service: Neurosurgery;  Laterality: N/A;  C4-5 C5-6 Anterior cervical decompression/diskectomy/fusion   ANTERIOR LAT LUMBAR  FUSION N/A 04/07/2017   Procedure: Thoracic twelve-Lumbar one Anterolateral decompression/fusion;  Surgeon: [name], MD;  Location: [facility];  Service: Neurosurgery;  Laterality: N/A;   BACK SURGERY     x 3   CARDIAC CATHETERIZATION  11/2008   [name] - 20% calcified non flow limiting left main, 50% EF apical hypokinesis   CERVICAL DISC ARTHROPLASTY N/A 04/07/2017   Procedure: Cervical six-seven Disc arthroplasty;  Surgeon: [name], MD;  Location: [place];  Service: Neurosurgery;  Laterality: N/A;   CHEST TUBE INSERTION Left 04/11/2017   Procedure: CHEST TUBE INSERTION;  Surgeon: [name], MD;  Location: [place];  Service: Thoracic;  Laterality: Left;   CHOLECYSTECTOMY     COLONOSCOPY  2016   CG-MAC-miralax(good)servere TICS/TA x 2   ESOPHAGEAL MANOMETRY N/A 05/30/2021   Procedure: ESOPHAGEAL MANOMETRY (EM);  Surgeon: Lavena Bullion, DO;  [facility];  Service: Gastroenterology;  Laterality: N/A;  ph impedence   ESOPHAGOGASTRODUODENOSCOPY     EYE SURGERY Bilateral 2011   steroidal encapsulation" (04/28/2012)   FOOT SURGERY Right    x 2   KNEE ARTHROSCOPY Bilateral    LATERAL / POSTERIOR COMBINED FUSION LUMBAR SPINE  2012   LEFT ATRIAL APPENDAGE OCCLUSION N/A 02/22/2021   Procedure: LEFT ATRIAL APPENDAGE OCCLUSION;  Surgeon: Vickie Epley, MD;  Location: [place] [facility];  Service: Cardiovascular;  Laterality: N/A;   POLYPECTOMY  2016   severe TICS/TA x 2   POSTERIOR CERVICAL LAMINECTOMY WITH MET- RX Right 06/11/2021   Procedure: Right Cervical six-seven  Laminectomy and foraminotomy with metrex;  Surgeon: [name], MD;  Location: [place];  Service: Neurosurgery;  Laterality: Right;   REVERSE SHOULDER ARTHROPLASTY  04/28/2012   Procedure: REVERSE SHOULDER ARTHROPLASTY;  Surgeon: [name], MD;  Location: [place];  Service: Orthopedics;  Laterality: Left;  Left shouder reverse total shoulder arthroplasty   SHOULDER SURGERY     TEE WITHOUT  CARDIOVERSION N/A 02/22/2021   Procedure: TRANSESOPHAGEAL ECHOCARDIOGRAM (TEE);  Surgeon: Vickie Epley, MD;  Location: [place] [facility];  Service: Cardiovascular;  Laterality: N/A;   TENDON REPAIR Right 08/07/2020   Procedure: RIGHT HAND LIGAMENT RECONSTRUCTION AND TENDON INTERPOSITION;  Surgeon: [name], MD;  Location: [place];  Service: Orthopedics;  Laterality: Right;   [place]   TOTAL HIP ARTHROPLASTY Left 2011   "left" (04/28/2012)   TOTAL HIP ARTHROPLASTY Right 07/31/2015   Procedure: TOTAL HIP ARTHROPLASTY ANTERIOR APPROACH;  Surgeon: [name], MD;  Location: [facility];  Service: Orthopedics;  Laterality: Right;   TOTAL KNEE ARTHROPLASTY  01/10/2012   Procedure: TOTAL KNEE ARTHROPLASTY;  Surgeon: [name], MD;  Location: [place];  Service: Orthopedics;;  left total knee arthroplasty   TOTAL KNEE ARTHROPLASTY Right 06/17/2014   Procedure: TOTAL KNEE ARTHROPLASTY;  Surgeon: [name], MD;  Location: [place];  Service: Orthopedics;  Laterality: Right;    Current Medications: Current Meds  Medication Sig   albuterol (VENTOLIN HFA) 108 (90 Base) MCG/ACT inhaler INHALE 2 PUFFS INTO THE LUNGS EVERY 4 (FOUR) HOURS AS NEEDED FOR WHEEZING OR SHORTNESS OF BREATH.   allopurinol (ZYLOPRIM) 100 MG tablet Take 100 mg by mouth daily.   atorvastatin (LIPITOR) 20 MG tablet TAKE 1 TABLET BY MOUTH EVERY DAY   candesartan (ATACAND) 4 MG tablet TAKE 1/2 TABLETS (2 MG TOTAL) BY MOUTH DAILY.   cyclobenzaprine (FLEXERIL) 10 MG tablet Take 1 tablet (10 mg total) by mouth 3 (three) times daily as needed for muscle spasms.   diclofenac Sodium (VOLTAREN) 1 % GEL Apply 2 g topically daily as needed (Pain).   diltiazem (CARDIZEM CD) 180 MG 24 hr capsule TAKE 1 CAPSULE BY MOUTH EVERY DAY   diltiazem (CARDIZEM) 30 MG tablet Take 30 mg by mouth as needed (heart rate over 120).   DULoxetine (CYMBALTA) 30 MG capsule Take 1 capsule (30 mg total) by mouth daily.    ELIQUIS 5 MG TABS tablet TAKE 1 TABLET BY MOUTH TWICE A DAY   furosemide (LASIX) 40 MG tablet Take 1 tablet (40 mg total) by mouth daily. As directed   ipratropium-albuterol (DUONEB) 0.5-2.5 (3) MG/3ML SOLN 1 NEBULE EVERY 6 HOURS AS NEEDED. **J45.3**   isosorbide mononitrate (IMDUR) 30 MG 24 hr tablet TAKE 1 TABLET (30 MG TOTAL) BY MOUTH DAILY.   Nebulizers (COMPRESSOR/NEBULIZER) MISC Use as directed   oxyCODONE-acetaminophen (PERCOCET/ROXICET) 5-325 MG tablet Take 1-2 tablets by mouth 2 (two) times daily as needed for severe pain.   potassium chloride SA (KLOR-CON M20) 20 MEQ tablet Take 1 tablet (20 mEq total) by mouth daily.   predniSONE (DELTASONE) 10 MG tablet TAKE 2 TABLETS (20 MG TOTAL) BY MOUTH DAILY. CONTINUOUS   RABEprazole (ACIPHEX) 20 MG tablet Take 1 tablet (20 mg total) by mouth daily.   STRIVERDI RESPIMAT 2.5 MCG/ACT AERS INHALE 2 PUFFS BY MOUTH INTO THE LUNGS DAILY   tamsulosin (FLOMAX) 0.4 MG CAPS capsule Take 0.4 mg by mouth at bedtime.      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   ROS:   Please see the history of present illness.    ROS All other systems reviewed and are negative.   PHYSICAL EXAM:   VS:  BP 116/70   Pulse 98   Ht _0  (1.88 m)   Wt 184 lb (83.5 kg)   SpO2 97%   BMI 23.62 kg/m   Physical Exam  GEN: Well nourished, well developed, in no acute distress  Neck: no JVD, carotid bruits, or masses Cardiac:RRR; no murmurs, rubs, or gallops  Respiratory:  clear to auscultation bilaterally, normal work of breathing GI: soft, nontender, nondistended, + BS Ext: trace edema, without cyanosis, clubbing,  Good distal pulses bilaterally Neuro:  Alert and Oriented x 3,  Psych: euthymic mood, full affect  Wt Readings from Last 3 Encounters:  10/23/21 184 lb (83.5 kg)  09/26/21 184 lb 12.8 oz (83.8 kg)  09/25/21 183 lb 9.6 oz (83.3 kg)      Studies/Labs Reviewed:   EKG:  EKG is not ordered today.     Recent Labs: 09/25/2021: ALT 13; BUN 17; Creatinine, Ser 0.76; Hemoglobin 13.2; Platelets 179; Potassium 3.6; Sodium 139   Lipid Panel    Component Value Date/Time   CHOL 188 09/28/2019 1017   TRIG 69 09/28/2019 1017   HDL 109 09/28/2019 1017   CHOLHDL 1.7 09/28/2019 1017   CHOLHDL 2 10/20/2014 1122   VLDL 12.0 10/20/2014 1122   LDLCALC 66 09/28/2019 1017    Additional studies/ records that were reviewed today include:   Echo 02/14/21   Left Ventricle: Left ventricular ejection fraction, by estimation, is 45  to 50%. The left ventricle has mildly decreased function. The left  ventricle demonstrates global hypokinesis. The left ventricular internal  cavity size was  normal in size. There is   mild concentric left ventricular hypertrophy. Left ventricular diastolic  parameters are indeterminate.  Right Ventricle: The right ventricular size is normal. No increase in  right ventricular wall thickness. Right ventricular systolic function is  normal.  Left Atrium: Left atrial size was mildly dilated.  Right Atrium: Right atrial size was normal in size.  Pericardium: There is no evidence of pericardial effusion.  Mitral Valve: The mitral valve is normal in structure. Trivial mitral  valve regurgitation. No evidence of mitral valve stenosis.  Tricuspid Valve: The tricuspid valve is normal in structure. Tricuspid  valve regurgitation is trivial. No evidence of tricuspid stenosis.  Aortic Valve: The aortic valve is tricuspid. Aortic valve regurgitation is  trivial. Mild aortic valve sclerosis is present, with no evidence of  aortic valve stenosis. Aortic valve mean gradient measures 6.8 mmHg.  Aortic valve peak gradient measures 12.9  mmHg. Aortic valve area, by VTI measures 2.34 cm.  Pulmonic Valve: The pulmonic valve was not well visualized. Pulmonic valve  regurgitation is trivial. No evidence of pulmonic stenosis.  Aorta: The aortic root, ascending aorta, aortic arch and descending aorta  are all structurally normal, with no evidence of dilitation or  obstruction.  Venous: The inferior vena cava is normal in size with greater than 50%  respiratory variability, suggesting right atrial pressure of 3 mmHg.  IAS/Shunts: The interatrial septum was not well visualized.    Cor CT 01/31/21 IMPRESSION: 1. The  left atrial appendage is a large chicken wing morphology without thrombus.   2. A 68m Watchman FLX device is recommended based on the above landing zone measurements (25.4 mm maximum diameter; 20% compression).   3. There is no thrombus in the left atrial appendage.   4. An inferior posterior IAS puncture site is recommended.   5. Optimal  deployment angle: RAO 2 CAU 15   6. Normal coronary origin. Right dominance.   7. Coronary artery calcium score is 197, which is 49th percentile for age and sex matched peers.           Risk Assessment/Calculations:    CHA2DS2-VASc Score = 3   This indicates a 3.2% annual risk of stroke. The patient's score is based upon: CHF History: 1 HTN History: 1 Diabetes History: 0 Stroke History: 0 Vascular Disease History: 0 Age Score: 1 Gender Score: 0        ASSESSMENT:    1. Chronic combined systolic and diastolic CHF (congestive heart failure) ([place]   2. Preoperative clearance   3. Paroxysmal atrial fibrillation (HCC)   4. Essential hypertension   5. Falls frequently      PLAN:  In order of problems listed above:   Chronic combined heart failure reduced EF LVEF 45 to 50% on echo 01/2021-was getting a lot of extra salt in his diet but has cut back and edema is down since extra lasix 40 mg bid for 3 days then back to 40 mg daily. Labs stable 09/25/21. Compression hose, 2 gm sodium diet. Stable for surgery.     Preop clearance for robotic Nissen 11/01/2021 at [facility]. CReino Bellis pharmacy has said to Hold his Eliquis 2 days prior to procedure. CT cardiac calcium score 197. Patient has recent worsening in LE edema and decreased LVEF 45-50% on recent echo. No chest pain or dyspnea and high METs. Will not require further cardiac work up prior to surgery.     According to the Revised Cardiac Risk Index (RCRI), his Perioperative Risk of Major Cardiac Event is (%): 0.9   His Functional Capacity in METs is: 6.7 according to the [facility] Activity Status Index (DASI).     PAF on Eliquis with aborted Watchman procedure-controlled rate   Hypertension on diltiazem isosorbide candesartan and furosemide    Frequent falls-uses cane.    Shared Decision Making/Informed Consent        Medication Adjustments/Labs and Tests Ordered: Current medicines are reviewed at  length with the patient today.  Concerns regarding medicines are outlined above.  Medication changes, Labs and Tests ordered today are listed in the Patient Instructions below. Patient Instructions  Medication Instructions:  Your physician recommends that you continue on your current medications as directed. Please refer to the Current Medication list given to you today.  *If you need a refill on your cardiac medications before your next appointment, please call your pharmacy*   Lab Work: None If you have labs (blood work) drawn today and your tests are completely normal, you will receive your results only by: [place](if you have MyChart) OR A paper copy in the mail If you have any lab test that is abnormal or we need to change your treatment, we will call you to review the results.   Follow-Up: [facility] and your health needs are our priority.  As part of our continuing mission to provide you with exceptional heart care, we have created designated Provider Care Teams.  These Care  Teams include your primary Cardiologist (physician) and Advanced Practice Providers (APPs -  Physician Assistants and Nurse Practitioners) who all work together to provide you with the care you need, when you need it.   Your next appointment:   6 month(s)  The format for your next appointment:   In Person  Provider:   Candee Furbish, MD {     Signed, Ermalinda Barrios, PA-C  10/23/2021 8:52 AM    [address], [place], [place]  [address] Phone: [phone]; Fax: [phone]

## 2021-10-23 ENCOUNTER — Encounter: Payer: Self-pay | Admitting: Physician Assistant

## 2021-10-23 ENCOUNTER — Ambulatory Visit (INDEPENDENT_AMBULATORY_CARE_PROVIDER_SITE_OTHER): Payer: Medicare Other | Admitting: Physician Assistant

## 2021-10-23 VITALS — BP 116/70 | HR 98 | Ht 74.0 in | Wt 184.0 lb

## 2021-10-23 DIAGNOSIS — I5042 Chronic combined systolic (congestive) and diastolic (congestive) heart failure: Secondary | ICD-10-CM

## 2021-10-23 DIAGNOSIS — I10 Essential (primary) hypertension: Secondary | ICD-10-CM

## 2021-10-23 DIAGNOSIS — I2510 Atherosclerotic heart disease of native coronary artery without angina pectoris: Secondary | ICD-10-CM

## 2021-10-23 DIAGNOSIS — I480 Paroxysmal atrial fibrillation: Secondary | ICD-10-CM

## 2021-10-23 DIAGNOSIS — Z01818 Encounter for other preprocedural examination: Secondary | ICD-10-CM | POA: Diagnosis not present

## 2021-10-23 DIAGNOSIS — R296 Repeated falls: Secondary | ICD-10-CM

## 2021-10-23 NOTE — Patient Instructions (Signed)
Medication Instructions:  Your physician recommends that you continue on your current medications as directed. Please refer to the Current Medication list given to you today.  *If you need a refill on your cardiac medications before your next appointment, please call your pharmacy*   Lab Work: None If you have labs (blood work) drawn today and your tests are completely normal, you will receive your results only by: [place] (if you have MyChart) OR A paper copy in the mail If you have any lab test that is abnormal or we need to change your treatment, we will call you to review the results.   Follow-Up: At [facility], you and your health needs are our priority.  As part of our continuing mission to provide you with exceptional heart care, we have created designated Provider Care Teams.  These Care Teams include your primary Cardiologist (physician) and Advanced Practice Providers (APPs -  Physician Assistants and Nurse Practitioners) who all work together to provide you with the care you need, when you need it.   Your next appointment:   6 month(s)  The format for your next appointment:   In Person  Provider:   Candee Furbish, MD {

## 2021-10-24 ENCOUNTER — Ambulatory Visit: Payer: Medicare Other | Admitting: Physician Assistant

## 2021-10-24 DIAGNOSIS — K219 Gastro-esophageal reflux disease without esophagitis: Secondary | ICD-10-CM | POA: Diagnosis not present

## 2021-10-25 ENCOUNTER — Ambulatory Visit: Payer: Medicare Other | Admitting: Physician Assistant

## 2021-11-01 DIAGNOSIS — Z7901 Long term (current) use of anticoagulants: Secondary | ICD-10-CM | POA: Diagnosis not present

## 2021-11-01 DIAGNOSIS — I509 Heart failure, unspecified: Secondary | ICD-10-CM | POA: Diagnosis not present

## 2021-11-01 DIAGNOSIS — E785 Hyperlipidemia, unspecified: Secondary | ICD-10-CM | POA: Diagnosis not present

## 2021-11-01 DIAGNOSIS — K219 Gastro-esophageal reflux disease without esophagitis: Secondary | ICD-10-CM | POA: Diagnosis not present

## 2021-11-01 DIAGNOSIS — Z881 Allergy status to other antibiotic agents status: Secondary | ICD-10-CM | POA: Diagnosis not present

## 2021-11-01 DIAGNOSIS — Z87891 Personal history of nicotine dependence: Secondary | ICD-10-CM | POA: Diagnosis not present

## 2021-11-01 DIAGNOSIS — Z77098 Contact with and (suspected) exposure to other hazardous, chiefly nonmedicinal, chemicals: Secondary | ICD-10-CM | POA: Diagnosis not present

## 2021-11-01 DIAGNOSIS — Z7952 Long term (current) use of systemic steroids: Secondary | ICD-10-CM | POA: Diagnosis not present

## 2021-11-01 DIAGNOSIS — Z885 Allergy status to narcotic agent status: Secondary | ICD-10-CM | POA: Diagnosis not present

## 2021-11-01 DIAGNOSIS — S27812A Contusion of esophagus (thoracic part), initial encounter: Secondary | ICD-10-CM | POA: Diagnosis not present

## 2021-11-01 DIAGNOSIS — I110 Hypertensive heart disease with heart failure: Secondary | ICD-10-CM | POA: Diagnosis not present

## 2021-11-01 DIAGNOSIS — J449 Chronic obstructive pulmonary disease, unspecified: Secondary | ICD-10-CM | POA: Diagnosis not present

## 2021-11-01 DIAGNOSIS — K449 Diaphragmatic hernia without obstruction or gangrene: Secondary | ICD-10-CM | POA: Diagnosis not present

## 2021-11-01 DIAGNOSIS — Z79899 Other long term (current) drug therapy: Secondary | ICD-10-CM | POA: Diagnosis not present

## 2021-11-01 HISTORY — PX: OTHER SURGICAL HISTORY: SHX169

## 2021-11-02 DIAGNOSIS — I110 Hypertensive heart disease with heart failure: Secondary | ICD-10-CM | POA: Diagnosis not present

## 2021-11-02 DIAGNOSIS — K219 Gastro-esophageal reflux disease without esophagitis: Secondary | ICD-10-CM | POA: Diagnosis not present

## 2021-11-02 DIAGNOSIS — J449 Chronic obstructive pulmonary disease, unspecified: Secondary | ICD-10-CM | POA: Diagnosis not present

## 2021-11-02 DIAGNOSIS — E785 Hyperlipidemia, unspecified: Secondary | ICD-10-CM | POA: Diagnosis not present

## 2021-11-02 DIAGNOSIS — I509 Heart failure, unspecified: Secondary | ICD-10-CM | POA: Diagnosis not present

## 2021-11-02 DIAGNOSIS — K449 Diaphragmatic hernia without obstruction or gangrene: Secondary | ICD-10-CM | POA: Diagnosis not present

## 2021-11-18 ENCOUNTER — Other Ambulatory Visit: Payer: Self-pay | Admitting: Cardiology

## 2021-11-18 ENCOUNTER — Other Ambulatory Visit: Payer: Self-pay | Admitting: Internal Medicine

## 2021-11-20 ENCOUNTER — Ambulatory Visit: Payer: Medicare Other | Admitting: Cardiology

## 2021-12-04 ENCOUNTER — Ambulatory Visit (INDEPENDENT_AMBULATORY_CARE_PROVIDER_SITE_OTHER): Payer: Medicare Other | Admitting: Family Medicine

## 2021-12-04 ENCOUNTER — Encounter: Payer: Self-pay | Admitting: Family Medicine

## 2021-12-04 VITALS — BP 113/69 | HR 80 | Ht 74.0 in | Wt 178.0 lb

## 2021-12-04 DIAGNOSIS — G629 Polyneuropathy, unspecified: Secondary | ICD-10-CM

## 2021-12-04 MED ORDER — DULOXETINE HCL 30 MG PO CPEP: 30.0000 mg | ORAL_CAPSULE | Freq: Every day | ORAL | 3 refills | Status: DC

## 2021-12-04 NOTE — Progress Notes (Signed)
Chief Complaint  Patient presents with   Follow-up    Pt alone, rm 16. He presents for follow up. He has discussed this concern before but last 6 weeks he has had these dizzy spells that come on suddenly lasting for around 10 min or so. It interferes with his vision. This occurred back in 2019. He states they can come on even when he is just sitting. Does not relate them to positional changes.     HISTORY OF PRESENT ILLNESS:  12/04/21 ALL:  Levi [name] returns for follow up for neuropathy. He was last seen by Dr [name] 08/2021 and reported more falls and gait instability. He was tolerating duloxetine 22m daily but felt that 316mworked better and dose was decreased. He feels that neuropathy seems stable. He has occasional burning. Neuropathy cream did not help much.   He reports that dizziness/lightheadedness has been a little worse over the past 6-8 weeks. Most of the time symptoms are consistent with standing from seated position. BP is normally "lower". No syncope. He "falls all the time". Mostly when not using He denies any significant injuries since last visit. He uses his four prong cane. Rarely uses walker. He wears compression stocking most of the time. He admits that he has not been drinking as much water recently. Lasix dose was increased to 4074mnd of May.   He did undergo robotic nissen fundoplication 6/81/9/3790e was initially recovering well but developed more dysphagia about a week ago. He reports spitting up a little blood. Medrol taper did not help much. He is being scheduled for UGI this week. He last noticed blood tinged sputum about 2 days ago.   He continues to follow closely with cardiology for CHF. He lives with his wife. He is able to drive.   04/09/2021 ALL:  Levi Listerturns for follow up. We increased gabapentin to 300m31mD and started compounded neuropathy cream at last visit 11/2020. He stopped gabapentin as it was not effective. He reports that [facility]  called him but never sent cream. He does not wish to resend orders. He reports neuropathy pain waxes and wanes. He continues to have regular falls. Fortunately, no injuries. He uses a cane most all the time except for when he is at home. Falls have occurred when he is not using his cane. He has a walker but doesn't like to use it. PT has not been helpful. He continues to see [facility] He is scheduled to return 05/09/2021 to discuss treatment for broken screw in his neck. He denies syncope since last being seen. Only one true syncopal event when seen by Dr AthaRexene Alberts has had 1-2 near syncopal events, last event was 2 weeks ago. Symptoms were very short lived, a minute or so. He reports blurry vision while driving. He felt that everything was slanted. He denies double vision. He states BP has been fluctuating from 80/60's to 150's/80's. He occasionally has dizziness with standing. He is on multiple cardiac medications. He is s/p failed watchman procedure. Last saw cardiology 10/20 who advised to continue Eliquis.   12/20/2020 ALL:  Levi BOOMERa 75 y69. male here today for follow up for bilateral leg pain. NCS/EMG testing shows chronic neuropathy. He was started on gabapentin 100mg11mly and advised to titrate to TID dosing. He has taken 300mg 55my at bedtime. He is uncertain if medicaiton as been helpful. He states that he was previously on gabapentin 300mg T35mnd that seemed to work  better. Burning pain continues to be fairly constant. Worse with activity and at night. He continues to see [facility] for nerve blocks and ESI. He is beings scheduled for an MRI of cervical spine in the next couple of week. He has severe arthritis of right foot. He was seen by podiatry and told it would take multiple surgeries over 5-6 years. He is on prednisone. He has had a total of 43 surgeries. He is currently being considered for insertion of Watchman.   HISTORY (copied from Dr [name] previous note)  Mr.  Levi [name] is a 76 year old right-handed gentleman with an underlying  Complex medical history of asthma, A. fib, hyperlipidemia, reflux disease with hiatal hernia, diverticulitis, degenerative disc disease, COPD, allergic rhinitis, history of agent orange exposure in the 70s, carpal tunnel syndrome, status post bilateral carpal tunnel release and ulnar release surgeries in January and February of this year, status post multiple surgeries including lumbar spine surgery, neck surgery, bilateral hip replacements, bilateral knee replacements, shoulder surgeries, who Presents for follow-up consultation of his neuropathy.  I saw him at the request of [name] on 10/21/2019, at which time the patient reported a longstanding history of numbness and tingling and pain in both lower extremities.  Symptoms were more on the right.   He had extensive blood work on 10/21/2019.  A1c was 5.5, ANA was negative, CRP was less than 1, rheumatoid factor was less than 10, CK was 59, heavy metals in the blood was negative, CMP showed glucose of 115, BUN 27, creatinine 0.88, AST 19, ALT 15, TSH was mildly elevated at 5.33, multiple myeloma panel showed decreased in immunoglobulins with a pattern suggestive of hypogammaglobulinemia, RPR nonreactive, vitamin B12 low at 224, thiamine 206.2.  He was advised to seek consultation with hematology.  He was furthermore advised to follow-up with his primary care for B12 deficiency and abnormal TSH.   He had an EMG and nerve conduction velocity test through our office on 11/17/2019 and I reviewed the results:   Conclusion: This is an abnormal study.  There is electrodiagnostic evidence of mild bilateral lumbosacral radiculopathy, mainly involving bilateral L4, L5, S1 myotomes, there is no evidence of active process.  In addition, there is evidence of mild length dependent axonal sensorimotor polyneuropathy.   We called him with his test results.  He saw hematology in July 2021,  hypogammaglobulinemia was not considered to be significant at the time.  He did not have any history of recurrent infections.  He was not felt to need a bone marrow biopsy.   Today, 09/20/2020: He reports ongoing issues with leg pain, right more than left.  It has not really progressed since last time but he has additional joint pains including both hips, sometimes he takes Flexeril at bedtime.  He does report that his balance is off and he has had falls, as recent as 2 days ago.  He fell in the yard.  He has a walker but typically only uses his four-point cane.  He tries to hydrate with water.  He reports a recent brief episode of A. fib, he reports that he can tell when he is in A. fib.  He is due for an eye exam.  He had a recent episode of dizziness and lightheadedness and had double vision which lasted about 10 minutes.  He has an eye examination yearly. He had an interim ER visit on 09/13/2020 for dizziness, lightheadedness.  I reviewed the emergency room records.  He had a head CT  without contrast on 09/13/2020 and I reviewed the results: IMPRESSION: Mild atrophic changes without acute abnormality.  He was felt to have orthostasis.   He had recent right hand surgery on 08/07/2020 under Dr. [name].  He had right hand ligament reconstruction and tendon interposition.  Pain is improved and mobility is better since surgery he reports.  He is not taking any oxycodone at this time.  Does take Flexeril as needed, typically only once at bedtime.   REVIEW OF SYSTEMS: Out of a complete 14 system review of symptoms, the patient complains only of the following symptoms, chronic pain, numbness/tingling, and all other reviewed systems are negative.   ALLERGIES: Allergies  Allergen Reactions   Morphine And Related Palpitations and Other (See Comments)    Made patient light-headed and sweaty; "my heart races" (04/28/2012), pt states too much morphine gave him this reaction   Levaquin [Levofloxacin] Itching      HOME MEDICATIONS: Outpatient Medications Prior to Visit  Medication Sig Dispense Refill   albuterol (VENTOLIN HFA) 108 (90 Base) MCG/ACT inhaler INHALE 2 PUFFS INTO THE LUNGS EVERY 4 (FOUR) HOURS AS NEEDED FOR WHEEZING OR SHORTNESS OF BREATH. 18 each 12   allopurinol (ZYLOPRIM) 100 MG tablet Take 100 mg by mouth daily.     atorvastatin (LIPITOR) 20 MG tablet TAKE 1 TABLET BY MOUTH EVERY DAY 90 tablet 3   candesartan (ATACAND) 4 MG tablet TAKE 1/2 TABLETS (2 MG TOTAL) BY MOUTH DAILY. 45 tablet 3   cyclobenzaprine (FLEXERIL) 10 MG tablet Take 1 tablet (10 mg total) by mouth 3 (three) times daily as needed for muscle spasms. 30 tablet 1   diclofenac Sodium (VOLTAREN) 1 % GEL Apply 2 g topically daily as needed (Pain).     diltiazem (CARDIZEM CD) 180 MG 24 hr capsule TAKE 1 CAPSULE BY MOUTH EVERY DAY 90 capsule 3   diltiazem (CARDIZEM) 30 MG tablet Take 30 mg by mouth as needed (heart rate over 120).     ELIQUIS 5 MG TABS tablet TAKE 1 TABLET BY MOUTH TWICE A DAY 60 tablet 6   furosemide (LASIX) 40 MG tablet Take 1 tablet (40 mg total) by mouth daily. As directed 90 tablet 3   ipratropium-albuterol (DUONEB) 0.5-2.5 (3) MG/3ML SOLN 1 NEBULE EVERY 6 HOURS AS NEEDED. **J45.3** 270 mL 4   isosorbide mononitrate (IMDUR) 30 MG 24 hr tablet TAKE 1 TABLET (30 MG TOTAL) BY MOUTH DAILY. 30 tablet 11   Nebulizers (COMPRESSOR/NEBULIZER) MISC Use as directed 1 each 0   oxyCODONE-acetaminophen (PERCOCET/ROXICET) 5-325 MG tablet Take 1-2 tablets by mouth 2 (two) times daily as needed for severe pain. 30 tablet 0   potassium chloride SA (KLOR-CON M20) 20 MEQ tablet Take 1 tablet (20 mEq total) by mouth daily. 90 tablet 3   predniSONE (DELTASONE) 10 MG tablet TAKE 2 TABLETS (20 MG TOTAL) BY MOUTH DAILY. CONTINUOUS 180 tablet 1   STRIVERDI RESPIMAT 2.5 MCG/ACT AERS INHALE 2 PUFFS BY MOUTH INTO THE LUNGS DAILY 4 g 4   tamsulosin (FLOMAX) 0.4 MG CAPS capsule Take 0.4 mg by mouth at bedtime.   11   DULoxetine  (CYMBALTA) 30 MG capsule Take 1 capsule (30 mg total) by mouth daily. 30 capsule 5   RABEprazole (ACIPHEX) 20 MG tablet Take 1 tablet (20 mg total) by mouth daily. 90 tablet 1   No facility-administered medications prior to visit.     PAST MEDICAL HISTORY: Past Medical History:  Diagnosis Date   Agent orange exposure 1970's  takes Imdur,Diltiazem, and Atacand daily   ALLERGIC RHINITIS    Allergy    seasonal allergies   Asthma    uses inhaler and nebulizers   Bruises easily    d/t prednisone daily   Cataract    CHF (congestive heart failure) ([place])    dx-on meds to treat   Chronic bronchitis ([place])    "used to get it q yr; last time ?2008" (04/28/2012)   COPD (chronic obstructive pulmonary disease) ([facility])    agent orange exposure   Degenerative disk disease    "everywhere" (04/28/2012)   Diverticulitis    Dysrhythmia    afib   Elevated uric acid in blood    takes Allopurinol daily   Emphysema of lung ([place])    Enlarged prostate    but not on any meds   GERD (gastroesophageal reflux disease)    takes Protonix daily   H/O hiatal hernia    Headache    History of pneumonia    a. 2010   Hyperlipidemia    takes Lipitor daily; pt. states he takes a preventive   Hypothyroidism    Joint pain    Joint swelling    Neuromuscular disorder ([place])    bilat legs, and bilat arms   PAF (paroxysmal atrial fibrillation) ([place])    a. Dx 04/2012, CHA2DS2VASc = 1 (age);  b. 04/2012 Echo: EF 40-50%, mild MR.   Personal history of colonic adenomas 02/09/2013   Pneumonia    Shortness of breath dyspnea    Thyroid disease    no taking meds-caused dizziness     PAST SURGICAL HISTORY: Past Surgical History:  Procedure Laterality Date   57 HOUR [place] STUDY N/A 05/30/2021   Procedure: 24 HOUR [place] STUDY;  Surgeon: Lavena Bullion, DO;  [facility]: [facility];  Service: Gastroenterology;  Laterality: N/A;   ANTERIOR CERVICAL DECOMP/DISCECTOMY FUSION N/A 07/21/2012   Procedure: ANTERIOR CERVICAL  DECOMPRESSION/DISCECTOMY FUSION 2 LEVELS;  Surgeon: [name], MD;  Location: [facility];  Service: Neurosurgery;  Laterality: N/A;  C4-5 C5-6 Anterior cervical decompression/diskectomy/fusion   ANTERIOR LAT LUMBAR FUSION N/A 04/07/2017   Procedure: Thoracic twelve-Lumbar one Anterolateral decompression/fusion;  Surgeon: [name], MD;  Location: [facility];  Service: Neurosurgery;  Laterality: N/A;   BACK SURGERY     x 3   CARDIAC CATHETERIZATION  11/2008   [name] - 20% calcified non flow limiting left main, 50% EF apical hypokinesis   CERVICAL DISC ARTHROPLASTY N/A 04/07/2017   Procedure: Cervical six-seven Disc arthroplasty;  Surgeon: [name], MD;  Location: [facility];  Service: Neurosurgery;  Laterality: N/A;   CHEST TUBE INSERTION Left 04/11/2017   Procedure: CHEST TUBE INSERTION;  Surgeon: [name], MD;  Location: [facility];  Service: Thoracic;  Laterality: Left;   CHOLECYSTECTOMY     COLONOSCOPY  2016   CG-MAC-miralax(good)servere TICS/TA x 2   ESOPHAGEAL MANOMETRY N/A 05/30/2021   Procedure: ESOPHAGEAL MANOMETRY (EM);  Surgeon: Lavena Bullion, DO;  [facility];  Service: Gastroenterology;  Laterality: N/A;  ph impedence   ESOPHAGOGASTRODUODENOSCOPY     EYE SURGERY Bilateral 2011   steroidal encapsulation" (04/28/2012)   FOOT SURGERY Right    x 2   KNEE ARTHROSCOPY Bilateral    LATERAL / POSTERIOR COMBINED FUSION LUMBAR SPINE  2012   LEFT ATRIAL APPENDAGE OCCLUSION N/A 02/22/2021   Procedure: LEFT ATRIAL APPENDAGE OCCLUSION;  Surgeon: Vickie Epley, MD;  Location: [facility];  Service: Cardiovascular;  Laterality: N/A;  POLYPECTOMY  2016   severe TICS/TA x 2   POSTERIOR CERVICAL LAMINECTOMY WITH MET- RX Right 06/11/2021   Procedure: Right Cervical six-seven  Laminectomy and foraminotomy with metrex;  Surgeon: [name], MD;  Location: [facility];  Service: Neurosurgery;  Laterality: Right;   REVERSE SHOULDER ARTHROPLASTY  04/28/2012   Procedure:  REVERSE SHOULDER ARTHROPLASTY;  Surgeon: [name], MD;  Location: [facility];  Service: Orthopedics;  Laterality: Left;  Left shouder reverse total shoulder arthroplasty   SHOULDER SURGERY     TEE WITHOUT CARDIOVERSION N/A 02/22/2021   Procedure: TRANSESOPHAGEAL ECHOCARDIOGRAM (TEE);  Surgeon: Vickie Epley, MD;  Location: [facility];  Service: Cardiovascular;  Laterality: N/A;   TENDON REPAIR Right 08/07/2020   Procedure: RIGHT HAND LIGAMENT RECONSTRUCTION AND TENDON INTERPOSITION;  Surgeon: [name], MD;  Location: [facility];  Service: Orthopedics;  Laterality: Right;   [place]   TOTAL HIP ARTHROPLASTY Left 2011   "left" (04/28/2012)   TOTAL HIP ARTHROPLASTY Right 07/31/2015   Procedure: TOTAL HIP ARTHROPLASTY ANTERIOR APPROACH;  Surgeon: [name], MD;  Location: [facility];  Service: Orthopedics;  Laterality: Right;   TOTAL KNEE ARTHROPLASTY  01/10/2012   Procedure: TOTAL KNEE ARTHROPLASTY;  Surgeon: [name], MD;  Location: [facility];  Service: Orthopedics;;  left total knee arthroplasty   TOTAL KNEE ARTHROPLASTY Right 06/17/2014   Procedure: TOTAL KNEE ARTHROPLASTY;  Surgeon: [name], MD;  Location: [facility];  Service: Orthopedics;  Laterality: Right;     FAMILY HISTORY: Family History  Problem Relation Age of Onset   Diabetes Mother    Heart disease Father        Died 74, MI   Prostate cancer Brother    Colon polyps Brother    Colon polyps Brother    Colon cancer Other    Esophageal cancer Neg Hx    Rectal cancer Neg Hx    Stomach cancer Neg Hx    Pancreatic cancer Neg Hx    Kidney disease Neg Hx    Liver disease Neg Hx    Neuropathy Neg Hx      SOCIAL HISTORY: Social History   Socioeconomic History   Marital status: Married    Spouse name: Not on file   Number of children: 1   Years of education: Not on file   Highest education level: Not on file  Occupational History   Occupation: Retired     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   Generalized: Well developed, in no acute distress  Cardiology: normal rate and rhythm, no murmur auscultated  Respiratory: clear to auscultation bilaterally    Neurological examination  Mentation: Alert oriented to time, place, history taking. Follows all commands speech and language fluent Cranial nerve II-XII: Pupils were equal round reactive to light. Extraocular movements were full, visual field were full on confrontational test. Facial sensation and strength were normal. Uvula tongue midline. Head turning and shoulder shrug  were normal and symmetric. Motor: The motor testing reveals 4-4+ over 5 strength of all 4 extremities with exception of R lower hip flexion and dorsiflexion 4/5.  Sensory: Sensory testing is decreased to soft and pinprick testing of bilateral lower extremities Coordination: Cerebellar testing reveals good finger-nose-finger, unable to perform heel to shin bilaterally Gait and  station: He pushes with some difficulty to standing position. Gait is slow and cautious. Stable with cane. Tandem not attempted.  Reflexes: Deep tendon reflexes are symmetric and normal bilaterally.    DIAGNOSTIC DATA (LABS, IMAGING, TESTING) - I reviewed patient records, labs, notes, testing and imaging myself where available.  Lab Results  Component Value Date   WBC 6.2 09/25/2021   HGB 13.2 09/25/2021   HCT 39.6 09/25/2021   MCV 97.5 09/25/2021   PLT 179 09/25/2021      Component Value Date/Time   NA 139 09/25/2021 1825   NA 143 08/15/2021 1349   K 3.6 09/25/2021 1825   CL 105 09/25/2021 1825   CO2 25 09/25/2021 1825   GLUCOSE 92 09/25/2021 1825   BUN 17 09/25/2021 1825   BUN 21 08/15/2021 1349   CREATININE 0.76 09/25/2021 1825   CREATININE 0.90 02/28/2020 1440   CALCIUM 9.4 09/25/2021 1825   PROT 6.6 09/25/2021 1825   PROT 5.9 (L) 10/21/2019 0941   ALBUMIN 4.0 09/25/2021 1825   ALBUMIN 4.1 10/21/2019 0941   AST 17 09/25/2021 1825   AST 19 02/28/2020 1440   ALT 13 09/25/2021 1825   ALT 15 02/28/2020 1440   ALKPHOS 88 09/25/2021 1825   BILITOT 0.9 09/25/2021 1825   BILITOT 0.6 02/28/2020 1440   GFRNONAA >60 09/25/2021 1825   GFRNONAA >60 02/28/2020 1440   GFRAA >60 02/28/2020 1440   Lab Results  Component Value Date   CHOL 188 09/28/2019   HDL 109 09/28/2019   LDLCALC 66 09/28/2019   TRIG 69 09/28/2019   CHOLHDL 1.7 09/28/2019   Lab Results  Component Value Date   HGBA1C 5.5 10/21/2019   Lab Results  Component Value Date   VITAMINB12 224 (L) 10/21/2019   Lab Results  Component Value Date   TSH 5.330 (H) 10/21/2019        No data to display               No data to display           ASSESSMENT AND PLAN  76 y.o. year old male  has a past medical history of Agent orange exposure (1970's), ALLERGIC RHINITIS, Allergy, Asthma, Bruises easily, Cataract, CHF (congestive heart failure) ([facility]), Chronic bronchitis ([facility]), COPD (chronic  obstructive pulmonary disease) ([place]), Degenerative disk disease, Diverticulitis, Dysrhythmia, Elevated uric acid in blood, Emphysema of lung ([place]), Enlarged prostate, GERD (gastroesophageal reflux disease), H/O hiatal hernia, Headache, History of pneumonia, Hyperlipidemia, Hypothyroidism, Joint pain, Joint swelling, Neuromuscular disorder ([place]), PAF (paroxysmal atrial fibrillation) (  [place]), Personal history of colonic adenomas (02/09/2013), Pneumonia, Shortness of breath dyspnea, and Thyroid disease. here with    Neuropathy - Plan: DULoxetine (CYMBALTA) 30 MG capsule  Levi [name] feels that neuropathy is stable. We will continue duloxetine 80m daily. He continues to have some lightheaded and instability. We have discussed multiple contributing factors. I have encouraged him to wear compression stockings, make position changes slowly and focus on adequate hydration. Fall precautions reviewed. He was encouraged to use cane at all times. Consider walker if he needs more stability. He declines PT referral. He will continue close follow up with PCP, cardiology and neurosurgery. I will have him return for follow up with me in 6 months. He verbalizes understanding and agreement with this plan.    No orders of the defined types were placed in this encounter.     Meds ordered this encounter  Medications   DULoxetine (CYMBALTA) 30 MG capsule    Sig: Take 1 capsule (30 mg total) by mouth daily.    Dispense:  90 capsule    Refill:  3    Order Specific Question:   Supervising Provider    Answer:   AMelvenia Beam[[[id number]      XIP JASNK MSN, FNP-C 12/04/2021, 12:54 PM  [facility] [address] [place] [phone]

## 2021-12-04 NOTE — Patient Instructions (Signed)
Below is our plan:  We will continue duloxetine '30mg'$  daily. Please keep an eye on your BP. Make position changes slowly. Wear compression stockings. Stay well hydrated.   Please make sure you are staying well hydrated. I recommend 50-60 ounces daily. Well balanced diet and regular exercise encouraged. Consistent sleep schedule with 6-8 hours recommended.   Please continue follow up with care team as directed.   Follow up with me in 6 months   You may receive a survey regarding today's visit. I encourage you to leave honest feed back as I do use this information to improve patient care. Thank you for seeing me today!

## 2021-12-06 DIAGNOSIS — K449 Diaphragmatic hernia without obstruction or gangrene: Secondary | ICD-10-CM | POA: Diagnosis not present

## 2021-12-06 DIAGNOSIS — K2289 Other specified disease of esophagus: Secondary | ICD-10-CM | POA: Diagnosis not present

## 2022-01-06 ENCOUNTER — Other Ambulatory Visit: Payer: Self-pay

## 2022-01-06 ENCOUNTER — Ambulatory Visit (INDEPENDENT_AMBULATORY_CARE_PROVIDER_SITE_OTHER): Payer: Medicare Other

## 2022-01-06 ENCOUNTER — Other Ambulatory Visit: Payer: Self-pay | Admitting: Internal Medicine

## 2022-01-06 ENCOUNTER — Ambulatory Visit
Admission: EM | Admit: 2022-01-06 | Discharge: 2022-01-06 | Disposition: A | Discharge status: Home / Self Care | Payer: Medicare Other | Attending: Family Medicine | Admitting: Family Medicine

## 2022-01-06 DIAGNOSIS — W19XXXA Unspecified fall, initial encounter: Secondary | ICD-10-CM | POA: Diagnosis not present

## 2022-01-06 DIAGNOSIS — R0781 Pleurodynia: Secondary | ICD-10-CM

## 2022-01-06 DIAGNOSIS — R0789 Other chest pain: Secondary | ICD-10-CM | POA: Diagnosis not present

## 2022-01-06 MED ORDER — OXYCODONE-ACETAMINOPHEN 5-325 MG PO TABS: 1.0000 | ORAL_TABLET | Freq: Four times a day (QID) | ORAL | 0 refills | Status: DC | PRN

## 2022-01-06 NOTE — ED Provider Notes (Signed)
[name]    CSN: [id number] Arrival date & time: 01/06/22  0800      History   Chief Complaint Chief Complaint  Patient presents with   Rib Injury    HPI Levi [name] is a 76 y.o. male.   HPI Very pleasant 76 year old male presents with right-sided rib pain for 3 days secondary to fall into his dresser at home.  PMH significant for atrial fibrillation, chest wall pain, COPD, and left ventricular dysfunction.  Past Medical History:  Diagnosis Date   Agent orange exposure 1970's   takes Imdur,Diltiazem, and Atacand daily   ALLERGIC RHINITIS    Allergy    seasonal allergies   Asthma    uses inhaler and nebulizers   Bruises easily    d/t prednisone daily   Cataract    CHF (congestive heart failure) (HCC)    dx-on meds to treat   Chronic bronchitis ([place])    "used to get it q yr; last time ?2008" (04/28/2012)   COPD (chronic obstructive pulmonary disease) (HCC)    agent orange exposure   Degenerative disk disease    "everywhere" (04/28/2012)   Diverticulitis    Dysrhythmia    afib   Elevated uric acid in blood    takes Allopurinol daily   Emphysema of lung ([place])    Enlarged prostate    but not on any meds   GERD (gastroesophageal reflux disease)    takes Protonix daily   H/O hiatal hernia    Headache    History of pneumonia    a. 2010   Hyperlipidemia    takes Lipitor daily; pt. states he takes a preventive   Hypothyroidism    Joint pain    Joint swelling    Neuromuscular disorder ([place])    bilat legs, and bilat arms   PAF (paroxysmal atrial fibrillation) ([place])    a. Dx 04/2012, CHA2DS2VASc = 1 (age);  b. 04/2012 Echo: EF 40-50%, mild MR.   Personal history of colonic adenomas 02/09/2013   Pneumonia    Shortness of breath dyspnea    Thyroid disease    no taking meds-caused dizziness    Patient Active Problem List   Diagnosis Date Noted   Lower extremity edema 08/08/2021   Heartburn    Atypical chest pain    Cervical radicular pain  06/11/2021   Chronic anticoagulation 03/15/2021   Peripheral neuropathy 02/22/2021   LV dysfunction 02/22/2021   Essential (primary) hypertension 11/15/2020   Arthritis of carpometacarpal [facility]) joint of right thumb 08/07/2020   Cellulitis 03/22/2020   COPD (chronic obstructive pulmonary disease) ([place]) 03/21/2020   Chest wall pain 09/21/2019   GERD (gastroesophageal reflux disease) 12/03/2017   Pneumothorax, left 04/11/2017   Lumbar stenosis with neurogenic claudication 04/07/2017   Primary osteoarthritis of right hip 07/31/2015   Primary osteoarthritis of right knee 06/17/2014   Thrush of mouth and esophagus ([place]) 04/03/2014   Palpitation 03/28/2014   Pseudoarthrosis of lumbar spine 06/15/2013   Hyperlipidemia    History of colonic polyps 02/09/2013   Atrial fibrillation ([place]) 05/11/2012   Osteoarthritis of left knee 01/10/2012   Seasonal and perennial allergic rhinitis 07/23/2010   Asthma, moderate persistent 03/06/2009    Past Surgical History:  Procedure Laterality Date   23 HOUR [place] STUDY N/A 05/30/2021   Procedure: 24 HOUR [place] STUDY;  Surgeon: Lavena Bullion, DO;  [facility]: [facility];  Service: Gastroenterology;  Laterality: N/A;   ANTERIOR CERVICAL DECOMP/DISCECTOMY FUSION N/A 07/21/2012  Procedure: ANTERIOR CERVICAL DECOMPRESSION/DISCECTOMY FUSION 2 LEVELS;  Surgeon: [name], MD;  Location: [place] [facility];  Service: Neurosurgery;  Laterality: N/A;  C4-5 C5-6 Anterior cervical decompression/diskectomy/fusion   ANTERIOR LAT LUMBAR FUSION N/A 04/07/2017   Procedure: Thoracic twelve-Lumbar one Anterolateral decompression/fusion;  Surgeon: [name], MD;  Location: [place];  Service: Neurosurgery;  Laterality: N/A;   BACK SURGERY     x 3   CARDIAC CATHETERIZATION  11/2008   [name] - 20% calcified non flow limiting left main, 50% EF apical hypokinesis   CERVICAL DISC ARTHROPLASTY N/A 04/07/2017   Procedure: Cervical six-seven Disc arthroplasty;  Surgeon: [name], MD;  Location: [place];  Service: Neurosurgery;  Laterality: N/A;   CHEST TUBE INSERTION Left 04/11/2017   Procedure: CHEST TUBE INSERTION;  Surgeon: [name], MD;  Location: [place];  Service: Thoracic;  Laterality: Left;   CHOLECYSTECTOMY     COLONOSCOPY  2016   CG-MAC-miralax(good)servere TICS/TA x 2   ESOPHAGEAL MANOMETRY N/A 05/30/2021   Procedure: ESOPHAGEAL MANOMETRY (EM);  Surgeon: Lavena Bullion, DO;  [facility];  Service: Gastroenterology;  Laterality: N/A;  ph impedence   ESOPHAGOGASTRODUODENOSCOPY     EYE SURGERY Bilateral 2011   steroidal encapsulation" (04/28/2012)   FOOT SURGERY Right    x 2   KNEE ARTHROSCOPY Bilateral    LATERAL / POSTERIOR COMBINED FUSION LUMBAR SPINE  2012   LEFT ATRIAL APPENDAGE OCCLUSION N/A 02/22/2021   Procedure: LEFT ATRIAL APPENDAGE OCCLUSION;  Surgeon: Vickie Epley, MD;  Location: [place] [facility];  Service: Cardiovascular;  Laterality: N/A;   POLYPECTOMY  2016   severe TICS/TA x 2   POSTERIOR CERVICAL LAMINECTOMY WITH MET- RX Right 06/11/2021   Procedure: Right Cervical six-seven  Laminectomy and foraminotomy with metrex;  Surgeon: [name], MD;  Location: [place];  Service: Neurosurgery;  Laterality: Right;   REVERSE SHOULDER ARTHROPLASTY  04/28/2012   Procedure: REVERSE SHOULDER ARTHROPLASTY;  Surgeon: [name], MD;  Location: [place];  Service: Orthopedics;  Laterality: Left;  Left shouder reverse total shoulder arthroplasty   SHOULDER SURGERY     TEE WITHOUT CARDIOVERSION N/A 02/22/2021   Procedure: TRANSESOPHAGEAL ECHOCARDIOGRAM (TEE);  Surgeon: Vickie Epley, MD;  Location: [place] [facility];  Service: Cardiovascular;  Laterality: N/A;   TENDON REPAIR Right 08/07/2020   Procedure: RIGHT HAND LIGAMENT RECONSTRUCTION AND TENDON INTERPOSITION;  Surgeon: [name], MD;  Location: [place];  Service: Orthopedics;  Laterality: Right;   [place]    TOTAL HIP ARTHROPLASTY Left 2011   "left" (04/28/2012)   TOTAL HIP ARTHROPLASTY Right 07/31/2015   Procedure: TOTAL HIP ARTHROPLASTY ANTERIOR APPROACH;  Surgeon: [name], MD;  Location: [place];  Service: Orthopedics;  Laterality: Right;   TOTAL KNEE ARTHROPLASTY  01/10/2012   Procedure: TOTAL KNEE ARTHROPLASTY;  Surgeon: [name], MD;  Location: [place];  Service: Orthopedics;;  left total knee arthroplasty   TOTAL KNEE ARTHROPLASTY Right 06/17/2014   Procedure: TOTAL KNEE ARTHROPLASTY;  Surgeon: [name], MD;  Location: [place];  Service: Orthopedics;  Laterality: Right;       Home Medications    Prior to Admission medications   Medication Sig Start Date End Date Taking? Authorizing Provider  oxyCODONE-acetaminophen (PERCOCET/ROXICET) 5-325 MG tablet Take 1-2 tablets by mouth every 6 (six) hours as needed for severe pain. 01/06/22  Yes Eliezer Lofts, FNP  predniSONE (DELTASONE) 10 MG tablet TAKE 2 TABLETS (20 MG  TOTAL) BY MOUTH DAILY. CONTINUOUS 11/20/21  Yes Young, Tarri Fuller D, MD  STRIVERDI RESPIMAT 2.5 MCG/ACT AERS INHALE 2 PUFFS BY MOUTH INTO THE LUNGS DAILY 02/25/21  Yes Baird Lyons D, MD  tamsulosin (FLOMAX) 0.4 MG CAPS capsule Take 0.4 mg by mouth at bedtime.  10/13/15  Yes [provider]  albuterol (VENTOLIN HFA) 108 (90 Base) MCG/ACT inhaler INHALE 2 PUFFS INTO THE LUNGS EVERY 4 (FOUR) HOURS AS NEEDED FOR WHEEZING OR SHORTNESS OF BREATH. 10/13/20   Baird Lyons D, MD  allopurinol (ZYLOPRIM) 100 MG tablet Take 100 mg by mouth daily.    [provider]  atorvastatin (LIPITOR) 20 MG tablet TAKE 1 TABLET BY MOUTH EVERY DAY 04/05/21   Jerline Pain, MD  candesartan (ATACAND) 4 MG tablet TAKE 1/2 TABLETS (2 MG TOTAL) BY MOUTH DAILY. 11/20/21   Jerline Pain, MD  cyclobenzaprine (FLEXERIL) 10 MG tablet Take 1 tablet (10 mg total) by mouth 3 (three) times daily as needed for muscle spasms. 06/11/21   [name], MD  diclofenac Sodium (VOLTAREN) 1 % GEL Apply 2 g  topically daily as needed (Pain).    [provider]  diltiazem (CARDIZEM CD) 180 MG 24 hr capsule TAKE 1 CAPSULE BY MOUTH EVERY DAY 11/20/21   Jerline Pain, MD  diltiazem (CARDIZEM) 30 MG tablet Take 30 mg by mouth as needed (heart rate over 120).    [provider]  DULoxetine (CYMBALTA) 30 MG capsule Take 1 capsule (30 mg total) by mouth daily. 12/04/21   Lomax, Amy, NP  ELIQUIS 5 MG TABS tablet TAKE 1 TABLET BY MOUTH TWICE A DAY 06/18/21   Jerline Pain, MD  furosemide (LASIX) 40 MG tablet Take 1 tablet (40 mg total) by mouth daily. As directed 08/08/21   Jerline Pain, MD  ipratropium-albuterol (DUONEB) 0.5-2.5 (3) MG/3ML SOLN 1 NEBULE EVERY 6 HOURS AS NEEDED. **J45.3** 03/22/20   Young, Tarri Fuller D, MD  isosorbide mononitrate (IMDUR) 30 MG 24 hr tablet TAKE 1 TABLET (30 MG TOTAL) BY MOUTH DAILY. 02/07/21   Jerline Pain, MD  Nebulizers (COMPRESSOR/NEBULIZER) MISC Use as directed 12/01/17   Deneise Lever, MD  potassium chloride SA (KLOR-CON M20) 20 MEQ tablet Take 1 tablet (20 mEq total) by mouth daily. 09/05/21   Jerline Pain, MD    Family History Family History  Problem Relation Age of Onset   Diabetes Mother    Heart disease Father        Died 74, MI   Prostate cancer Brother    Colon polyps Brother    Colon polyps Brother    Colon cancer Other    Esophageal cancer Neg Hx    Rectal cancer Neg Hx    Stomach cancer Neg Hx    Pancreatic cancer Neg Hx    Kidney disease Neg Hx    Liver disease Neg Hx    Neuropathy Neg Hx     Social History Social History   Tobacco Use   Smoking status: Never   Smokeless tobacco: Never  Vaping Use   Vaping Use: Never used  Substance Use Topics   Alcohol use: No    Alcohol/week: 0.0 standard drinks of alcohol   Drug use: No     Allergies   Morphine and related and Levaquin [levofloxacin]   Review of Systems Review of Systems  Musculoskeletal:        Right-sided anterior lateral rib pain x3 days     Physical  Exam  Triage Vital Signs ED Triage Vitals  Enc Vitals Group     BP 01/06/22 0809 124/77     Pulse Rate 01/06/22 0809 83     Resp 01/06/22 0809 18     Temp 01/06/22 0809 97.9 F (36.6 C)     Temp Source 01/06/22 0809 Oral     SpO2 01/06/22 0809 97 %     Weight 01/06/22 0810 184 lb (83.5 kg)     Height 01/06/22 0810 _0  (1.88 m)     Head Circumference --      Peak Flow --      Pain Score 01/06/22 0809 8     Pain Loc --      Pain Edu? --      Excl. in [place]? --    No data found.  Updated Vital Signs BP 124/77 (BP Location: Right Arm)   Pulse 83   Temp 97.9 F (36.6 C) (Oral)   Resp 18   Ht _1  (1.88 m)   Wt 184 lb (83.5 kg)   SpO2 97%   BMI 23.62 kg/m   Physical Exam Vitals and nursing note reviewed.  Constitutional:      Appearance: Normal appearance. He is normal weight.  HENT:     Head: Normocephalic and atraumatic.     Mouth/Throat:     Mouth: Mucous membranes are moist.     Pharynx: Oropharynx is clear.  Eyes:     Extraocular Movements: Extraocular movements intact.     Conjunctiva/sclera: Conjunctivae normal.     Pupils: Pupils are equal, round, and reactive to light.  Cardiovascular:     Rate and Rhythm: Normal rate and regular rhythm.     Pulses: Normal pulses.     Heart sounds: Normal heart sounds.  Pulmonary:     Effort: Pulmonary effort is normal.     Breath sounds: Normal breath sounds. No wheezing, rhonchi or rales.  Musculoskeletal:     Cervical back: Normal range of motion and neck supple.     Comments: Right-sided rib (anterior lateral aspect): TTP over fifth through ninth ribs, no deformity noted  Skin:    General: Skin is warm and dry.  Neurological:     General: No focal deficit present.     Mental Status: He is alert and oriented to person, place, and time.      UC Treatments / Results  Labs (all labs ordered are listed, but only abnormal results are displayed) Labs Reviewed - No data to display  EKG   Radiology DG Ribs  Unilateral W/Chest Right  Result Date: 01/06/2022 CLINICAL DATA:  Fall.  Right-sided rib pain. EXAM: RIGHT RIBS AND CHEST - 3+ VIEW COMPARISON:  July 24, 2021 FINDINGS: The heart, hila, mediastinum, lungs, and pleura are unremarkable. No pneumothorax. The left-sided rib fractures identified on July 24, 2021 have it least partially healed with callus formation. Calluses in the anterior right 6, seventh, and eighth ribs are also identified on the comparison study consistent with healed fractures. No definite acute right-sided fractures are noted. IMPRESSION: 1. Healed bilateral rib fractures with callus formation. No definitive acute fractures on the right. No pneumothorax. Electronically Signed   By: Dorise Bullion III M.D.   On: 01/06/2022 08:51    Procedures Procedures (including critical care time)  Medications Ordered in UC Medications - No data to display  Initial Impression / Assessment and Plan / UC Course  I have reviewed the triage vital signs and the nursing notes.  Pertinent labs & imaging results that were available during my care of the patient were reviewed by me and considered in my medical decision making (see chart for details).     MDM: 1.  Fall, initial encounter-patient advised on household fall prevention; 2.  Rib pain on right side-right-sided rib x-ray revealed above.  Rx'd Percocet. Advised patient may take Percocet as needed for right-sided rib pain.  Encouraged patient to increase daily water intake while taking this medication.  Advised patient if symptoms worsen and/or unresolved please follow-up PCP or here for further evaluation.  Patient discharged home, hemodynamically stable. Final Clinical Impressions(s) / UC Diagnoses   Final diagnoses:  Fall, initial encounter  Rib pain on right side     Discharge Instructions      Advised patient may take Percocet as needed for right-sided rib pain.  Encouraged patient to increase daily water intake while  taking this medication.  Advised patient if symptoms worsen and/or unresolved please follow-up PCP or here for further evaluation.     ED Prescriptions     Medication Sig Dispense Auth. Provider   oxyCODONE-acetaminophen (PERCOCET/ROXICET) 5-325 MG tablet Take 1-2 tablets by mouth every 6 (six) hours as needed for severe pain. 30 tablet Eliezer Lofts, FNP      I have reviewed the PDMP during this encounter.   Eliezer Lofts, [place] 01/06/22 [phone]

## 2022-01-06 NOTE — ED Triage Notes (Signed)
Patient c/o of right side pain onset since Thursday. Patient reported he fell in his basement.

## 2022-01-06 NOTE — Discharge Instructions (Addendum)
Advised patient may take Percocet as needed for right-sided rib pain.  Encouraged patient to increase daily water intake while taking this medication.  Advised patient if symptoms worsen and/or unresolved please follow-up PCP or here for further evaluation.

## 2022-01-07 ENCOUNTER — Telehealth: Payer: Self-pay

## 2022-01-07 NOTE — Telephone Encounter (Signed)
Albuterol refilled for 1 year

## 2022-01-07 NOTE — Telephone Encounter (Signed)
TCT pt to follow up from recent visit. Pt denies any needs or concerns at this time.

## 2022-01-18 ENCOUNTER — Other Ambulatory Visit: Payer: Self-pay | Admitting: Internal Medicine

## 2022-01-18 NOTE — Telephone Encounter (Signed)
Striverdi refilled

## 2022-01-22 ENCOUNTER — Ambulatory Visit (INDEPENDENT_AMBULATORY_CARE_PROVIDER_SITE_OTHER): Payer: Medicare Other | Admitting: Sports Medicine

## 2022-01-22 ENCOUNTER — Ambulatory Visit (INDEPENDENT_AMBULATORY_CARE_PROVIDER_SITE_OTHER): Payer: Medicare Other

## 2022-01-22 ENCOUNTER — Encounter: Payer: Self-pay | Admitting: Sports Medicine

## 2022-01-22 VITALS — BP 105/64 | HR 83 | Ht 74.0 in | Wt 181.0 lb

## 2022-01-22 DIAGNOSIS — M79671 Pain in right foot: Secondary | ICD-10-CM

## 2022-01-22 DIAGNOSIS — R0789 Other chest pain: Secondary | ICD-10-CM | POA: Diagnosis not present

## 2022-01-22 DIAGNOSIS — M14671 Charcot's joint, right ankle and foot: Secondary | ICD-10-CM

## 2022-01-22 DIAGNOSIS — I2510 Atherosclerotic heart disease of native coronary artery without angina pectoris: Secondary | ICD-10-CM | POA: Diagnosis not present

## 2022-01-22 DIAGNOSIS — Z89511 Acquired absence of right leg below knee: Secondary | ICD-10-CM | POA: Insufficient documentation

## 2022-01-22 DIAGNOSIS — R600 Localized edema: Secondary | ICD-10-CM | POA: Diagnosis not present

## 2022-01-22 DIAGNOSIS — G6289 Other specified polyneuropathies: Secondary | ICD-10-CM | POA: Diagnosis not present

## 2022-01-22 DIAGNOSIS — M2140 Flat foot [pes planus] (acquired), unspecified foot: Secondary | ICD-10-CM | POA: Diagnosis not present

## 2022-01-22 DIAGNOSIS — Z0000 Encounter for general adult medical examination without abnormal findings: Secondary | ICD-10-CM | POA: Insufficient documentation

## 2022-01-22 DIAGNOSIS — M79672 Pain in left foot: Secondary | ICD-10-CM | POA: Diagnosis not present

## 2022-01-22 DIAGNOSIS — M7731 Calcaneal spur, right foot: Secondary | ICD-10-CM | POA: Diagnosis not present

## 2022-01-22 DIAGNOSIS — Z23 Encounter for immunization: Secondary | ICD-10-CM

## 2022-01-22 DIAGNOSIS — M19072 Primary osteoarthritis, left ankle and foot: Secondary | ICD-10-CM | POA: Diagnosis not present

## 2022-01-22 MED ORDER — ACETAMINOPHEN ER 650 MG PO TBCR: 650.0000 mg | EXTENDED_RELEASE_TABLET | Freq: Three times a day (TID) | ORAL | 3 refills | Status: DC | PRN

## 2022-01-22 MED ORDER — SHINGRIX 50 MCG/0.5ML IM SUSR: 0.5000 mL | Freq: Once | INTRAMUSCULAR | 0 refills | Status: AC

## 2022-01-22 NOTE — Assessment & Plan Note (Signed)
Chronic right foot flatfoot deformity, he does have peripheral neuropathy, I do suspect this is a Charcot foot. Significant pain, he has seen foot and ankle surgery, he has had custom orthotics, nothing helps, principal complaint is discomfort/pain. Unable to use NSAIDs due to Eliquis, we will add arthritis from Tylenol, in the future we may consider Neurontin.

## 2022-01-22 NOTE — Assessment & Plan Note (Signed)
Pleasant 76 year old male, here to establish care. He is up-to-date on most of his screenings, he is due for the flu shots we gave him 1 today, he is also due for Shingrix, sending this to his pharmacy.

## 2022-01-22 NOTE — Progress Notes (Signed)
    Procedures performed today:    None.  Independent interpretation of notes and tests performed by another provider:   None.  Brief History, Exam, Impression, and Recommendations:    Annual physical exam Pleasant 76 year old male, here to establish care. He is up-to-date on most of his screenings, he is due for the flu shots we gave him 1 today, he is also due for Shingrix, sending this to his pharmacy.  Chest wall pain Also having right-sided chest wall pain, pain is directly over the ribs on exam. He does have a history of multiple fractured ribs with some callus formation. We added a rib belt, we will do this for about a month or 6 weeks, he will do some arthritis from Tylenol and if persistent discomfort we will do a CT scan of his abdomen and pelvis.  Charcot's joint of foot, right Chronic right foot flatfoot deformity, he does have peripheral neuropathy, I do suspect this is a Charcot foot. Significant pain, he has seen foot and ankle surgery, he has had custom orthotics, nothing helps, principal complaint is discomfort/pain. Unable to use NSAIDs due to Eliquis, we will add arthritis from Tylenol, in the future we may consider Neurontin.  Lower extremity edema I have recommended bilateral compression stockings.  Peripheral neuropathy Nerve conduction study from 2021 showed electrodiagnostic evidence of mild bilateral lumbosacral radiculopathy mainly bilateral L4, L5, and S1 myotomes, nothing active, there is also mild length dependent axonal sensorimotor polyneuropathy. If persistent discomfort we may consider Neurontin in the future, I do think this is the cause of his Charcot foot.    ____________________________________________ [name]. [name], M.D., ABFM., CAQSM., AME. Primary Care and Sports Medicine [facility]  Adjunct Professor of [employer]  Risk manager

## 2022-01-22 NOTE — Assessment & Plan Note (Signed)
Nerve conduction study from 2021 showed electrodiagnostic evidence of mild bilateral lumbosacral radiculopathy mainly bilateral L4, L5, and S1 myotomes, nothing active, there is also mild length dependent axonal sensorimotor polyneuropathy. If persistent discomfort we may consider Neurontin in the future, I do think this is the cause of his Charcot foot.

## 2022-01-22 NOTE — Assessment & Plan Note (Signed)
Also having right-sided chest wall pain, pain is directly over the ribs on exam. He does have a history of multiple fractured ribs with some callus formation. We added a rib belt, we will do this for about a month or 6 weeks, he will do some arthritis from Tylenol and if persistent discomfort we will do a CT scan of his abdomen and pelvis.

## 2022-01-22 NOTE — Assessment & Plan Note (Signed)
I have recommended bilateral compression stockings.

## 2022-01-24 ENCOUNTER — Other Ambulatory Visit (HOSPITAL_COMMUNITY): Payer: Self-pay

## 2022-01-31 ENCOUNTER — Other Ambulatory Visit (HOSPITAL_COMMUNITY): Payer: Self-pay

## 2022-02-04 ENCOUNTER — Other Ambulatory Visit (HOSPITAL_COMMUNITY): Payer: Self-pay

## 2022-02-04 ENCOUNTER — Telehealth: Payer: Self-pay | Admitting: Pharmacy Technician

## 2022-02-04 NOTE — Telephone Encounter (Signed)
We received a form from the pt's ins benefit coordinator to verify that the pt needs the albuterol inhaler as a 16 day supply, vs their allowable 30 day supply. Is the pt really using it this much, or can we tell the pharmacy to fill it for a 30 day supply, since it's PRN?

## 2022-02-04 NOTE — Telephone Encounter (Signed)
He also has Stiverdi. I am not aware that he needs to refill a rescue inhaler for other than 30 days per unit at a time.

## 2022-02-04 NOTE — Telephone Encounter (Signed)
ATC patient to clarify use of Albuterol. No answer will try again.

## 2022-02-05 NOTE — Telephone Encounter (Signed)
Called pharmacy to make sure they didn't have any issues filling the pt's Albuterol. He has a generic ProAir ready to pick up. I informed them that it was fine to fill it as a 30 day supply, that he shouldn't need it more than that!

## 2022-02-07 ENCOUNTER — Other Ambulatory Visit: Payer: Self-pay | Admitting: Cardiology

## 2022-02-07 NOTE — Telephone Encounter (Signed)
Prescription refill request for Eliquis received. Indication:Afib Last office visit:5/23 Scr:0.7 Age: 76 Weight:82.1 kg  Prescription refilled

## 2022-02-11 ENCOUNTER — Telehealth: Payer: Self-pay | Admitting: *Deleted

## 2022-02-11 NOTE — Patient Outreach (Signed)
  Care Coordination   02/11/2022 Name: Levi [name] MRN: [id number] DOB: 1946-04-30   Care Coordination Outreach Attempts:  An unsuccessful telephone outreach was attempted today to offer the patient information about available care coordination services as a benefit of their health plan.   Follow Up Plan:  Additional outreach attempts will be made to offer the patient care coordination information and services.   Encounter Outcome:  No Answer  Care Coordination Interventions Activated:  No   Care Coordination Interventions:  No, not indicated    Raina Mina, RN Care Management Coordinator [place] Office [phone]

## 2022-02-12 ENCOUNTER — Telehealth: Payer: Self-pay

## 2022-02-12 NOTE — Telephone Encounter (Signed)
Yes, since he did well with surgery and if no change in cardiac history, should be fine to proceed with colonoscopy using prior clearance for Eliquis hold given close proximity to the previous clearance provided.  Thanks.

## 2022-02-12 NOTE — Telephone Encounter (Signed)
Will pt's cardiac clearance and 2-day eliquis hold approval for his 08/28/29 Toupet fundoplication suffice for his upcoming colonoscopy? He was seen by you 09/10/21 in office and possible [facility] surgery was discussed. Cardiac clearance and Eliquis hold for the [facility] surgery was granted 10/23/21 by [facility] Public PA-0c/[name]. (Toupet Fundoplication was performed by Dr. Gillie Manners at [facility] 11/01/21 and pt has been released to prn status there.) Ok to proceed with PV 02/14/22 and [place] colonoscopy 02/28/22 as scheduled? Thanks, Levi [name] (PV)

## 2022-02-14 ENCOUNTER — Ambulatory Visit (AMBULATORY_SURGERY_CENTER): Payer: Medicare Other

## 2022-02-14 VITALS — Ht 74.0 in | Wt 183.0 lb

## 2022-02-14 DIAGNOSIS — Z86010 Personal history of colonic polyps: Secondary | ICD-10-CM

## 2022-02-14 MED ORDER — NA SULFATE-K SULFATE-MG SULF 17.5-3.13-1.6 GM/177ML PO SOLN: 1.0000 | ORAL | 0 refills | Status: DC

## 2022-02-14 NOTE — Progress Notes (Signed)
No egg or soy allergy known to patient  No issues known to pt with past sedation with any surgeries or procedures Patient denies ever being told they had issues or difficulty with intubation  No FH of Malignant Hyperthermia Pt is not on diet pills Pt is not on  home 02  Pt takes Eliquis for A. Fib. He knows to stop Eliquis 2 days before colonoscopy.   Pt denies issues with constipation. He knows his previous prep was inadequate and verbalizes understanding of 2-day prep this time. Have any cardiac testing pending--denied Pt instructed to use Singlecare.com or GoodRx for a price reduction on prep

## 2022-02-18 ENCOUNTER — Ambulatory Visit: Payer: Self-pay

## 2022-02-18 NOTE — Patient Outreach (Signed)
  Care Coordination   Initial Visit Note   02/18/2022 Name: Levi [name] MRN: [id number] DOB: August 09, 1945  Levi [name] is a 76 y.o. year old male who sees Thekkekandam, [name], MD for primary care. I spoke with  Levi [name] by phone today.  What matters to the patients health and wellness today?  No concerns, doing well at this time    Goals Addressed             This Visit's Progress    COMPLETED: Care Coordination Activities       Care Coordination Interventions: SDoH screening performed - no acute resource challenges identified at this time Determined the patient does not have concerns with medication costs and/or adherence Discussed the patient recently switched primary care providers and is now being seen by Dr. [name] - information updated on patients chart Education provided about the role of the care coordination team - no follow up desired at this time Instructed the patient to contact his primary care provider as needed        SDOH assessments and interventions completed:  Yes  SDOH Interventions Today    Flowsheet Row Most Recent Value  SDOH Interventions   Food Insecurity Interventions Intervention Not Indicated  Housing Interventions Intervention Not Indicated  Transportation Interventions Intervention Not Indicated  Utilities Interventions Intervention Not Indicated  Financial Strain Interventions Intervention Not Indicated        Care Coordination Interventions Activated:  Yes  Care Coordination Interventions:  Yes, provided   Follow up plan: No further intervention required.   Encounter Outcome:  Pt. Visit Completed   Daneen Schick, BSW, CDP Social Worker, Certified Dementia Practitioner [place] [facility]  Care Coordination [phone]

## 2022-02-18 NOTE — Patient Instructions (Signed)
Visit Information  Thank you for taking time to visit with me today. Please don't hesitate to contact me if I can be of assistance to you.   Following are the goals we discussed today:   Goals Addressed             This Visit's Progress    COMPLETED: Care Coordination Activities       Care Coordination Interventions: SDoH screening performed - no acute resource challenges identified at this time Determined the patient does not have concerns with medication costs and/or adherence Discussed the patient recently switched primary care providers and is now being seen by Dr. [name] - information updated on patients chart Education provided about the role of the care coordination team - no follow up desired at this time Instructed the patient to contact his primary care provider as needed        Please call the care guide team at [phone] if you need to schedule an appointment with our care coordination team.  If you are experiencing a Mental Health or [place] or need someone to talk to, please call [phone]-TALK (toll free, 24 hour hotline)  Patient verbalizes understanding of instructions and care plan provided today and agrees to view in [place]. Active MyChart status and patient understanding of how to access instructions and care plan via MyChart confirmed with patient.     No further follow up required: Please contact your primary care provider as needed.  Daneen Schick, BSW, CDP Social Worker, Certified Dementia Practitioner [facility]  Care Coordination [phone]

## 2022-02-21 ENCOUNTER — Ambulatory Visit (INDEPENDENT_AMBULATORY_CARE_PROVIDER_SITE_OTHER): Payer: Medicare Other | Admitting: Sports Medicine

## 2022-02-21 ENCOUNTER — Encounter: Payer: Self-pay | Admitting: Sports Medicine

## 2022-02-21 DIAGNOSIS — M14671 Charcot's joint, right ankle and foot: Secondary | ICD-10-CM | POA: Diagnosis not present

## 2022-02-21 DIAGNOSIS — N400 Enlarged prostate without lower urinary tract symptoms: Secondary | ICD-10-CM | POA: Insufficient documentation

## 2022-02-21 DIAGNOSIS — Z0000 Encounter for general adult medical examination without abnormal findings: Secondary | ICD-10-CM | POA: Diagnosis not present

## 2022-02-21 DIAGNOSIS — R0789 Other chest pain: Secondary | ICD-10-CM | POA: Diagnosis not present

## 2022-02-21 DIAGNOSIS — I2510 Atherosclerotic heart disease of native coronary artery without angina pectoris: Secondary | ICD-10-CM | POA: Diagnosis not present

## 2022-02-21 DIAGNOSIS — I10 Essential (primary) hypertension: Secondary | ICD-10-CM

## 2022-02-21 DIAGNOSIS — N401 Enlarged prostate with lower urinary tract symptoms: Secondary | ICD-10-CM | POA: Diagnosis not present

## 2022-02-21 MED ORDER — PREGABALIN 50 MG PO CAPS: ORAL_CAPSULE | ORAL | 3 refills | Status: DC

## 2022-02-21 MED ORDER — SHINGRIX 50 MCG/0.5ML IM SUSR: 0.5000 mL | Freq: Once | INTRAMUSCULAR | 0 refills | Status: AC

## 2022-02-21 NOTE — Assessment & Plan Note (Signed)
Levi [name] does have a Charcot foot with his right flatfoot deformity. He also has peripheral neuropathy on nerve conduction study. He has seen Dr. Melony Overly with orthopedic foot and ankle surgery who suggested there was not a surgical answer to this. Principal complaint was more discomfort, burning pain. Gabapentin was ineffective in the past, unable to use NSAIDs due to Eliquis, Tylenol insufficiently efficacious. We will go and try Lyrica in an up taper.

## 2022-02-21 NOTE — Progress Notes (Signed)
    Procedures performed today:    None.  Independent interpretation of notes and tests performed by another provider:   None.  Brief History, Exam, Impression, and Recommendations:    Chest wall pain Resolved with conservative treatment.  Charcot's joint of foot, right Levi [name] does have a Charcot foot with his right flatfoot deformity. He also has peripheral neuropathy on nerve conduction study. He has seen Dr. Melony Overly with orthopedic foot and ankle surgery who suggested there was not a surgical answer to this. Principal complaint was more discomfort, burning pain. Gabapentin was ineffective in the past, unable to use NSAIDs due to Eliquis, Tylenol insufficiently efficacious. We will go and try Lyrica in an up taper.  Essential (primary) hypertension Levi [name] is also describing episodes that sound like orthostasis, he sometimes stands up too quickly and then his vision goes fuzzy and he feels like he might pass out/gets lightheaded. He does have a fairly significant cardiac history so I would like him to touch base with his cardiologist for this, in the meantime I would like him to hold his Flomax for the next couple of weeks.  BPH (benign prostatic hyperplasia) BPH with obstructive uropathy symptoms, he is getting what sounds to be orthostatic symptoms, we will hold Flomax for now.  Annual physical exam Sending Shingrix to the pharmacy again.  I spent 30 minutes of total time managing this patient today, this includes chart review, face to face, and non-face to face time.   ____________________________________________ [name]. [name], M.D., ABFM., CAQSM., AME. Primary Care and Sports Medicine [facility]  Adjunct Professor of [employer]  Risk manager

## 2022-02-21 NOTE — Assessment & Plan Note (Signed)
Sending Shingrix to the pharmacy again.

## 2022-02-21 NOTE — Assessment & Plan Note (Signed)
Levi [name] is also describing episodes that sound like orthostasis, he sometimes stands up too quickly and then his vision goes fuzzy and he feels like he might pass out/gets lightheaded. He does have a fairly significant cardiac history so I would like him to touch base with his cardiologist for this, in the meantime I would like him to hold his Flomax for the next couple of weeks.

## 2022-02-21 NOTE — Assessment & Plan Note (Signed)
Resolved with conservative treatment.

## 2022-02-21 NOTE — Assessment & Plan Note (Signed)
BPH with obstructive uropathy symptoms, he is getting what sounds to be orthostatic symptoms, we will hold Flomax for now.

## 2022-02-25 DIAGNOSIS — Z23 Encounter for immunization: Secondary | ICD-10-CM | POA: Diagnosis not present

## 2022-02-28 ENCOUNTER — Ambulatory Visit (AMBULATORY_SURGERY_CENTER): Payer: Medicare Other | Admitting: Gastroenterology

## 2022-02-28 ENCOUNTER — Encounter: Payer: Self-pay | Admitting: Gastroenterology

## 2022-02-28 VITALS — BP 122/63 | HR 65 | Temp 97.3°F | Resp 13 | Ht 74.0 in | Wt 183.0 lb

## 2022-02-28 DIAGNOSIS — K5730 Diverticulosis of large intestine without perforation or abscess without bleeding: Secondary | ICD-10-CM

## 2022-02-28 DIAGNOSIS — D125 Benign neoplasm of sigmoid colon: Secondary | ICD-10-CM | POA: Diagnosis not present

## 2022-02-28 DIAGNOSIS — D123 Benign neoplasm of transverse colon: Secondary | ICD-10-CM

## 2022-02-28 DIAGNOSIS — Z86010 Personal history of colonic polyps: Secondary | ICD-10-CM | POA: Diagnosis not present

## 2022-02-28 DIAGNOSIS — Z09 Encounter for follow-up examination after completed treatment for conditions other than malignant neoplasm: Secondary | ICD-10-CM | POA: Diagnosis not present

## 2022-02-28 MED ORDER — SODIUM CHLORIDE 0.9 % IV SOLN: 500.0000 mL | Freq: Once | INTRAVENOUS | Status: DC

## 2022-02-28 NOTE — Progress Notes (Signed)
Called to room to assist during endoscopic procedure.  Patient ID and intended procedure confirmed with present staff. Received instructions for my participation in the procedure from the performing physician.  

## 2022-02-28 NOTE — Patient Instructions (Signed)
Handouts provided about diverticulosis and polyps.  Resume previous diet.   Continue present medications; Resume Eliquis at prior dose TOMORROW.  Await pathology results.  Repeat colonoscopy for surveillance based on pathology results.    YOU HAD AN ENDOSCOPIC PROCEDURE TODAY AT [facility]:   Refer to the procedure report that was given to you for any specific questions about what was found during the examination.  If the procedure report does not answer your questions, please call your gastroenterologist to clarify.  If you requested that your care partner not be given the details of your procedure findings, then the procedure report has been included in a sealed envelope for you to review at your convenience later.  YOU SHOULD EXPECT: Some feelings of bloating in the abdomen. Passage of more gas than usual.  Walking can help get rid of the air that was put into your GI tract during the procedure and reduce the bloating. If you had a lower endoscopy (such as a colonoscopy or flexible sigmoidoscopy) you may notice spotting of blood in your stool or on the toilet paper. If you underwent a bowel prep for your procedure, you may not have a normal bowel movement for a few days.  Please Note:  You might notice some irritation and congestion in your nose or some drainage.  This is from the oxygen used during your procedure.  There is no need for concern and it should clear up in a day or so.  SYMPTOMS TO REPORT IMMEDIATELY:  Following lower endoscopy (colonoscopy or flexible sigmoidoscopy):  Excessive amounts of blood in the stool  Significant tenderness or worsening of abdominal pains  Swelling of the abdomen that is new, acute  Fever of 100F or higher  For urgent or emergent issues, a gastroenterologist can be reached at any hour by calling [phone]. Do not use MyChart messaging for urgent concerns.    DIET:  We do recommend a small meal at first, but then you may proceed to  your regular diet.  Drink plenty of fluids but you should avoid alcoholic beverages for 24 hours.  ACTIVITY:  You should plan to take it easy for the rest of today and you should NOT DRIVE or use heavy machinery until tomorrow (because of the sedation medicines used during the test).    FOLLOW UP: Our staff will call the number listed on your records the next business day following your procedure.  We will call around 7:15- 8:00 am to check on you and address any questions or concerns that you may have regarding the information given to you following your procedure. If we do not reach you, we will leave a message.     If any biopsies were taken you will be contacted by phone or by letter within the next 1-3 weeks.  Please call us at [phone] if you have not heard about the biopsies in 3 weeks.    SIGNATURES/CONFIDENTIALITY: You and/or your care partner have signed paperwork which will be entered into your electronic medical record.  These signatures attest to the fact that that the information above on your After Visit Summary has been reviewed and is understood.  Full responsibility of the confidentiality of this discharge information lies with you and/or your care-partner.

## 2022-02-28 NOTE — Op Note (Signed)
[place] Patient Name: Levi [name] Procedure Date: 02/28/2022 11:31 AM MRN: [id number] Endoscopist: Gerrit Heck , MD Age: 76 Referring MD:  Date of Birth: 1945/09/15 Gender: Male Account #: [id number] Procedure:                Colonoscopy Indications:              Surveillance: History of adenomatous polyps.                            Inadequate prep on last exam                           -Colonoscopy in 01/2013 with 3 small tubular                            adenomas.                           -Colonoscopy in 01/2015 with 1 small tubular                            adenoma, with recommendation to repeat in 5                           Years                           -Colonoscopy in 03/2020 with 6 subcentimeter                            adenomas, pandiverticulosis, internal hemorrhoids.                            Due to fair prep, recommended 1 year repeat Medicines:                Monitored Anesthesia Care Procedure:                Pre-Anesthesia Assessment:                           - Prior to the procedure, a History and Physical                            was performed, and patient medications and                            allergies were reviewed. The patient's tolerance of                            previous anesthesia was also reviewed. The risks                            and benefits of the procedure and the sedation                            options and risks were discussed with the patient.  All questions were answered, and informed consent                            was obtained. Prior Anticoagulants: The patient has                            taken Eliquis (apixaban), last dose was 2 days                            prior to procedure. ASA Grade Assessment: III - A                            patient with severe systemic disease. After                            reviewing the risks and benefits, the patient was                             deemed in satisfactory condition to undergo the                            procedure.                           After obtaining informed consent, the colonoscope                            was passed under direct vision. Throughout the                            procedure, the patient's blood pressure, pulse, and                            oxygen saturations were monitored continuously. The                            PCF-HQ190L Colonoscope was introduced through the                            anus and advanced to the the cecum, identified by                            appendiceal orifice and ileocecal valve. The                            colonoscopy was performed without difficulty. The                            patient tolerated the procedure well. The quality                            of the bowel preparation was good. The ileocecal  valve, appendiceal orifice, and rectum were                            photographed. Scope In: 11:36:49 AM Scope Out: 11:55:22 AM Scope Withdrawal Time: 0 hours 13 minutes 24 seconds  Total Procedure Duration: 0 hours 18 minutes 33 seconds  Findings:                 The perianal and digital rectal examinations were                            normal.                           Three sessile polyps were found in the sigmoid                            colon (1) and transverse colon (2). The polyps were                            3 to 5 mm in size. These polyps were removed with a                            cold snare. Resection and retrieval were complete.                            Estimated blood loss was minimal.                           Multiple small and large-mouthed diverticula were                            found in the sigmoid colon, descending colon,                            transverse colon and ascending colon.                           The retroflexed view of the distal rectum and anal                             verge was normal and showed no anal or rectal                            abnormalities. Complications:            No immediate complications. Estimated Blood Loss:     Estimated blood loss was minimal. Impression:               - Three 3 to 5 mm polyps in the sigmoid colon and                            in the transverse colon, removed with a cold snare.  Resected and retrieved.                           - Diverticulosis in the sigmoid colon, in the                            descending colon, in the transverse colon and in                            the ascending colon.                           - The distal rectum and anal verge are normal on                            retroflexion view. Recommendation:           - Patient has a contact number available for                            emergencies. The signs and symptoms of potential                            delayed complications were discussed with the                            patient. Return to normal activities tomorrow.                            Written discharge instructions were provided to the                            patient.                           - Resume previous diet.                           - Continue present medications.                           - Await pathology results.                           - Repeat colonoscopy for surveillance based on                            pathology results.                           - Return to GI office PRN.                           - Resume Eliquis (apixaban) at prior dose tomorrow. Gerrit Heck, MD 02/28/2022 11:59:37 AM

## 2022-02-28 NOTE — Progress Notes (Signed)
Pt's states no medical or surgical changes since previsit or office visit. VS assessed by C.W 

## 2022-02-28 NOTE — Progress Notes (Signed)
GASTROENTEROLOGY PROCEDURE H&P NOTE   Primary Care Physician: Silverio Decamp, MD    Reason for Procedure:   Colon polyp surveillance  Plan:    Colonoscopy  Patient is appropriate for endoscopic procedure(s) in the ambulatory ([facility]) setting.  The nature of the procedure, as well as the risks, benefits, and alternatives were carefully and thoroughly reviewed with the patient. Ample time for discussion and questions allowed. The patient understood, was satisfied, and agreed to proceed.     HPI: Levi [name] is a 76 y.o. male who presents for Colonoscopy for ongoing polyp surveillance.   -Colonoscopy in 01/2013 with 3 small tubular adenomas. -Colonoscopy in 01/2015 with 1 small tubular adenoma, with recommendation to repeat in 5 Years -Colonoscopy in 03/2020 with 6 subcentimeter adenomas, pandiverticulosis, internal hemorrhoids. Due to fair prep, recommended 1 year repeat   Past Medical History:  Diagnosis Date   Agent orange exposure 1970's   takes Imdur,Diltiazem, and Atacand daily   ALLERGIC RHINITIS    Allergy    seasonal allergies   Asthma    uses inhaler and nebulizers   Bruises easily    d/t prednisone daily   Cataract    CHF (congestive heart failure) ([place])    dx-on meds to treat   Chronic bronchitis ([place])    "used to get it q yr; last time ?2008" (04/28/2012)   COPD (chronic obstructive pulmonary disease) ([facility])    agent orange exposure   Degenerative disk disease    "everywhere" (04/28/2012)   Diverticulitis    Dysrhythmia    afib   Elevated uric acid in blood    takes Allopurinol daily   Emphysema of lung ([place])    Enlarged prostate    but not on any meds   GERD (gastroesophageal reflux disease)    takes Protonix daily   H/O hiatal hernia    Headache    History of pneumonia    a. 2010   Hyperlipidemia    takes Lipitor daily; pt. states he takes a preventive   Hypothyroidism    Joint pain    Joint swelling    Neuromuscular disorder ([place])     bilat legs, and bilat arms   PAF (paroxysmal atrial fibrillation) ([place])    a. Dx 04/2012, CHA2DS2VASc = 1 (age);  b. 04/2012 Echo: EF 40-50%, mild MR.   Personal history of colonic adenomas 02/09/2013   Pneumonia    Shortness of breath dyspnea    Thyroid disease    no taking meds-caused dizziness    Past Surgical History:  Procedure Laterality Date   51 HOUR [place] STUDY N/A 05/30/2021   Procedure: 24 HOUR [place] STUDY;  Surgeon: Lavena Bullion, DO;  Location: [facility];  Service: Gastroenterology;  Laterality: N/A;   ANTERIOR CERVICAL DECOMP/DISCECTOMY FUSION N/A 07/21/2012   Procedure: ANTERIOR CERVICAL DECOMPRESSION/DISCECTOMY FUSION 2 LEVELS;  Surgeon: [name], MD;  Location: [facility];  Service: Neurosurgery;  Laterality: N/A;  C4-5 C5-6 Anterior cervical decompression/diskectomy/fusion   ANTERIOR LAT LUMBAR FUSION N/A 04/07/2017   Procedure: Thoracic twelve-Lumbar one Anterolateral decompression/fusion;  Surgeon: [name], MD;  Location: [facility];  Service: Neurosurgery;  Laterality: N/A;   BACK SURGERY     x 3   CARDIAC CATHETERIZATION  11/2008   [name] - 20% calcified non flow limiting left main, 50% EF apical hypokinesis   CERVICAL DISC ARTHROPLASTY N/A 04/07/2017   Procedure: Cervical six-seven Disc arthroplasty;  Surgeon: [name], MD;  Location: [facility];  Service:  Neurosurgery;  Laterality: N/A;   CHEST TUBE INSERTION Left 04/11/2017   Procedure: CHEST TUBE INSERTION;  Surgeon: [name], MD;  Location: [facility];  Service: Thoracic;  Laterality: Left;   CHOLECYSTECTOMY     COLONOSCOPY  2016   CG-MAC-miralax(good)servere TICS/TA x 2   ESOPHAGEAL MANOMETRY N/A 05/30/2021   Procedure: ESOPHAGEAL MANOMETRY (EM);  Surgeon: Lavena Bullion, DO;  [facility]: [facility];  Service: Gastroenterology;  Laterality: N/A;  ph impedence   ESOPHAGOGASTRODUODENOSCOPY     EYE SURGERY Bilateral 2011   steroidal encapsulation" (04/28/2012)   FOOT SURGERY Right    x  2   KNEE ARTHROSCOPY Bilateral    LATERAL / POSTERIOR COMBINED FUSION LUMBAR SPINE  2012   LEFT ATRIAL APPENDAGE OCCLUSION N/A 02/22/2021   Procedure: LEFT ATRIAL APPENDAGE OCCLUSION;  Surgeon: Vickie Epley, MD;  Location: [facility];  Service: Cardiovascular;  Laterality: N/A;   POLYPECTOMY  2016   severe TICS/TA x 2   POSTERIOR CERVICAL LAMINECTOMY WITH MET- RX Right 06/11/2021   Procedure: Right Cervical six-seven  Laminectomy and foraminotomy with metrex;  Surgeon: [name], MD;  Location: [facility];  Service: Neurosurgery;  Laterality: Right;   REVERSE SHOULDER ARTHROPLASTY  04/28/2012   Procedure: REVERSE SHOULDER ARTHROPLASTY;  Surgeon: [name], MD;  Location: [facility];  Service: Orthopedics;  Laterality: Left;  Left shouder reverse total shoulder arthroplasty   SHOULDER SURGERY     TEE WITHOUT CARDIOVERSION N/A 02/22/2021   Procedure: TRANSESOPHAGEAL ECHOCARDIOGRAM (TEE);  Surgeon: Vickie Epley, MD;  Location: [facility];  Service: Cardiovascular;  Laterality: N/A;   TENDON REPAIR Right 08/07/2020   Procedure: RIGHT HAND LIGAMENT RECONSTRUCTION AND TENDON INTERPOSITION;  Surgeon: [name], MD;  Location: [facility];  Service: Orthopedics;  Laterality: Right;   [place]   TOTAL HIP ARTHROPLASTY Left 2011   "left" (04/28/2012)   TOTAL HIP ARTHROPLASTY Right 07/31/2015   Procedure: TOTAL HIP ARTHROPLASTY ANTERIOR APPROACH;  Surgeon: [name], MD;  Location: [facility];  Service: Orthopedics;  Laterality: Right;   TOTAL KNEE ARTHROPLASTY  01/10/2012   Procedure: TOTAL KNEE ARTHROPLASTY;  Surgeon: [name], MD;  Location: [facility];  Service: Orthopedics;;  left total knee arthroplasty   TOTAL KNEE ARTHROPLASTY Right 06/17/2014   Procedure: TOTAL KNEE ARTHROPLASTY;  Surgeon: [name], MD;  Location: [facility];  Service: Orthopedics;  Laterality: Right;   toupet fundolplication  28/36/6294    Prior  to Admission medications   Medication Sig Start Date End Date Taking? Authorizing Provider  acetaminophen (TYLENOL) 650 MG CR tablet Take 1 tablet (650 mg total) by mouth every 8 (eight) hours as needed for pain. 01/22/22  Yes Silverio Decamp, MD  albuterol (VENTOLIN HFA) 108 (90 Base) MCG/ACT inhaler INHALE 2 PUFFS INTO THE LUNGS EVERY 4 HOURS AS NEEDED FOR WHEEZING OR SHORTNESS OF BREATH. 01/07/22  Yes Young, Clinton D, MD  allopurinol (ZYLOPRIM) 100 MG tablet Take 100 mg by mouth daily.   Yes [provider]  atorvastatin (LIPITOR) 20 MG tablet TAKE 1 TABLET BY MOUTH EVERY DAY 04/05/21  Yes Jerline Pain, MD  candesartan (ATACAND) 4 MG tablet TAKE 1/2 TABLETS (2 MG TOTAL) BY MOUTH DAILY. 11/20/21  Yes Jerline Pain, MD  cyclobenzaprine (FLEXERIL) 10 MG tablet Take 1 tablet (10 mg total) by mouth 3 (three) times daily as needed for muscle spasms. 06/11/21  Yes [name], MD  diltiazem (CARDIZEM CD)  180 MG 24 hr capsule TAKE 1 CAPSULE BY MOUTH EVERY DAY 11/20/21  Yes Jerline Pain, MD  diltiazem (CARDIZEM) 30 MG tablet Take 30 mg by mouth as needed (heart rate over 120).   Yes [provider]  DULoxetine (CYMBALTA) 30 MG capsule Take 1 capsule (30 mg total) by mouth daily. 12/04/21  Yes Lomax, Amy, NP  furosemide (LASIX) 40 MG tablet Take 1 tablet (40 mg total) by mouth daily. As directed 08/08/21  Yes Jerline Pain, MD  isosorbide mononitrate (IMDUR) 30 MG 24 hr tablet TAKE 1 TABLET (30 MG TOTAL) BY MOUTH DAILY. 02/07/21  Yes Jerline Pain, MD  Olodaterol HCl (STRIVERDI RESPIMAT) 2.5 MCG/ACT AERS INHALE 2 PUFFS BY MOUTH INTO THE LUNGS DAILY 01/18/22  Yes Young, Clinton D, MD  predniSONE (DELTASONE) 10 MG tablet TAKE 2 TABLETS (20 MG TOTAL) BY MOUTH DAILY. CONTINUOUS 11/20/21  Yes Baird Lyons D, MD  tamsulosin (FLOMAX) 0.4 MG CAPS capsule 1 capsule hs Orally Once a day 04/06/21  Yes [provider]  diclofenac Sodium (VOLTAREN) 1 % GEL Apply 2 g topically daily as  needed (Pain). Patient not taking: Reported on 02/28/2022    [provider]  ELIQUIS 5 MG TABS tablet TAKE 1 TABLET BY MOUTH TWICE A DAY 02/07/22   Jerline Pain, MD  ipratropium-albuterol (DUONEB) 0.5-2.5 (3) MG/3ML SOLN 1 NEBULE EVERY 6 HOURS AS NEEDED. **J45.3** Patient not taking: Reported on 02/28/2022 03/22/20   Deneise Lever, MD  Nebulizers (COMPRESSOR/NEBULIZER) MISC Use as directed Patient not taking: Reported on 02/28/2022 12/01/17   Baird Lyons D, MD  potassium chloride SA (KLOR-CON M20) 20 MEQ tablet Take 1 tablet (20 mEq total) by mouth daily. Patient not taking: Reported on 02/28/2022 09/05/21   Jerline Pain, MD  pregabalin (LYRICA) 50 MG capsule 1 capsule p.o. nightly for a week then twice daily for a week then 3 times daily Patient not taking: Reported on 02/28/2022 02/21/22   Silverio Decamp, MD    Current Outpatient Medications  Medication Sig Dispense Refill   acetaminophen (TYLENOL) 650 MG CR tablet Take 1 tablet (650 mg total) by mouth every 8 (eight) hours as needed for pain. 90 tablet 3   albuterol (VENTOLIN HFA) 108 (90 Base) MCG/ACT inhaler INHALE 2 PUFFS INTO THE LUNGS EVERY 4 HOURS AS NEEDED FOR WHEEZING OR SHORTNESS OF BREATH. 18 each 12   allopurinol (ZYLOPRIM) 100 MG tablet Take 100 mg by mouth daily.     atorvastatin (LIPITOR) 20 MG tablet TAKE 1 TABLET BY MOUTH EVERY DAY 90 tablet 3   candesartan (ATACAND) 4 MG tablet TAKE 1/2 TABLETS (2 MG TOTAL) BY MOUTH DAILY. 45 tablet 3   cyclobenzaprine (FLEXERIL) 10 MG tablet Take 1 tablet (10 mg total) by mouth 3 (three) times daily as needed for muscle spasms. 30 tablet 1   diltiazem (CARDIZEM CD) 180 MG 24 hr capsule TAKE 1 CAPSULE BY MOUTH EVERY DAY 90 capsule 3   diltiazem (CARDIZEM) 30 MG tablet Take 30 mg by mouth as needed (heart rate over 120).     DULoxetine (CYMBALTA) 30 MG capsule Take 1 capsule (30 mg total) by mouth daily. 90 capsule 3   furosemide (LASIX) 40 MG tablet Take 1 tablet (40 mg  total) by mouth daily. As directed 90 tablet 3   isosorbide mononitrate (IMDUR) 30 MG 24 hr tablet TAKE 1 TABLET (30 MG TOTAL) BY MOUTH DAILY. 30 tablet 11   Olodaterol HCl (STRIVERDI RESPIMAT) 2.5 MCG/ACT  AERS INHALE 2 PUFFS BY MOUTH INTO THE LUNGS DAILY 4 g 12   predniSONE (DELTASONE) 10 MG tablet TAKE 2 TABLETS (20 MG TOTAL) BY MOUTH DAILY. CONTINUOUS 180 tablet 1   tamsulosin (FLOMAX) 0.4 MG CAPS capsule 1 capsule hs Orally Once a day     diclofenac Sodium (VOLTAREN) 1 % GEL Apply 2 g topically daily as needed (Pain). (Patient not taking: Reported on 02/28/2022)     ELIQUIS 5 MG TABS tablet TAKE 1 TABLET BY MOUTH TWICE A DAY 60 tablet 6   ipratropium-albuterol (DUONEB) 0.5-2.5 (3) MG/3ML SOLN 1 NEBULE EVERY 6 HOURS AS NEEDED. **J45.3** (Patient not taking: Reported on 02/28/2022) 270 mL 4   Nebulizers (COMPRESSOR/NEBULIZER) MISC Use as directed (Patient not taking: Reported on 02/28/2022) 1 each 0   potassium chloride SA (KLOR-CON M20) 20 MEQ tablet Take 1 tablet (20 mEq total) by mouth daily. (Patient not taking: Reported on 02/28/2022) 90 tablet 3   pregabalin (LYRICA) 50 MG capsule 1 capsule p.o. nightly for a week then twice daily for a week then 3 times daily (Patient not taking: Reported on 02/28/2022) 90 capsule 3   Current Facility-Administered Medications  Medication Dose Route Frequency Provider Last Rate Last Admin   0.9 %  sodium chloride infusion  500 mL Intravenous Once Lesleyann Fichter V, DO        Allergies as of 02/28/2022 - Review Complete 02/28/2022  Allergen Reaction Noted   Morphine and related Palpitations and Other (See Comments) 01/10/2012   Levaquin [levofloxacin] Itching     Family History  Problem Relation Age of Onset   Diabetes Mother    Heart disease Father        Died 74, MI   Prostate cancer Brother    Colon polyps Brother    Colon polyps Brother    Colon cancer Brother        Dx age 52   Colon cancer Other    Esophageal cancer Neg Hx    Rectal cancer  Neg Hx    Stomach cancer Neg Hx    Pancreatic cancer Neg Hx    Kidney disease Neg Hx    Liver disease Neg Hx    Neuropathy Neg Hx     Social History   Socioeconomic History   Marital status: Married    Spouse name: Not on file   Number of children: 1   Years of education: Not on file   Highest education level: Not on file  Occupational History   Occupation: Retired    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, Radiographer, therapeutic   Social Determinants of Health   Financial Resource Strain: Low Risk  (02/18/2022)   Overall Financial Resource Strain (CARDIA)    Difficulty of Paying Living Expenses: Not hard at all  Food Insecurity: No Food Insecurity (02/18/2022)   Hunger Vital Sign    Worried About Running Out of  Food in the Last Year: Never true    [place] in the Last Year: Never true  Transportation Needs: No Transportation Needs (02/18/2022)   PRAPARE - Hydrologist (Medical): No    Lack of Transportation (Non-Medical): No  Physical Activity: Not on file  Stress: Not on file  Social Connections: Not on file  Intimate Partner Violence: Not on file    Physical Exam: Vital signs in last 24 hours: _0  135/72   Pulse 76   Temp (!) 97.3 F (36.3 C) (Skin)   Ht _1  (1.88 m)   Wt 183 lb (83 kg)   SpO2 97%   BMI 23.50 kg/m  GEN: NAD EYE: Sclerae anicteric ENT: MMM CV: Non-tachycardic Pulm: CTA b/l GI: Soft, NT/ND NEURO:  Alert &  Oriented x 3   Gerrit Heck, DO [facility]   02/28/2022 11:27 AM

## 2022-02-28 NOTE — Progress Notes (Signed)
PT taken to PACU. Monitors in place. VSS. Report given to RN. 

## 2022-03-01 ENCOUNTER — Telehealth: Payer: Self-pay | Admitting: *Deleted

## 2022-03-01 NOTE — Telephone Encounter (Signed)
  Follow up Call-    Row Labels 02/28/2022   11:16 AM 01/11/2021    8:03 AM 06/23/2020    7:43 AM 04/12/2020    7:15 AM  Call back number   Section Header. No data exists in this row.      Post procedure Call Back phone  #   [phone]  Permission to leave phone message   Yes Yes Yes Yes     Patient questions:  Do you have a fever, pain , or abdominal swelling? No. Pain Score  0 *  Have you tolerated food without any problems? Yes.    Have you been able to return to your normal activities? Yes.    Do you have any questions about your discharge instructions: Diet   No. Medications  No. Follow up visit  No.  Do you have questions or concerns about your Care? No.  Actions: * If pain score is 4 or above: No action needed, pain <4.

## 2022-03-04 ENCOUNTER — Telehealth: Payer: Self-pay | Admitting: Gastroenterology

## 2022-03-04 ENCOUNTER — Other Ambulatory Visit: Payer: Self-pay | Admitting: Cardiology

## 2022-03-04 MED ORDER — PANTOPRAZOLE SODIUM 40 MG PO TBEC: 40.0000 mg | DELAYED_RELEASE_TABLET | Freq: Every day | ORAL | 0 refills | Status: DC

## 2022-03-04 NOTE — Telephone Encounter (Signed)
Hi Dr. Bryan Lemma, Patient called requesting for Protonix refill. It is not on his med list. Please advise.

## 2022-03-04 NOTE — Telephone Encounter (Signed)
Medication Sent. LVM to patient.

## 2022-03-04 NOTE — Telephone Encounter (Signed)
He underwent a robotic toupee fundoplication on 01/28/4738 at [facility].  Some recent breakthrough type symptoms with plan to treat with Protonix 40 mg daily x4 weeks.  If symptoms controlled/resolved, then reduce to every other day and to hopefully titrate off pending clinical response.

## 2022-03-04 NOTE — Telephone Encounter (Signed)
Patient states he was supposed to be given protonix to take for 4 weeks but never got sent to pharmacy, please advise.

## 2022-03-04 NOTE — Addendum Note (Signed)
Addended by: Howell Pringle on: 03/04/2022 04:53 PM   Modules accepted: Orders

## 2022-03-06 ENCOUNTER — Encounter: Payer: Self-pay | Admitting: Gastroenterology

## 2022-03-06 ENCOUNTER — Ambulatory Visit: Payer: Medicare Other | Admitting: Family Medicine

## 2022-03-06 DIAGNOSIS — N401 Enlarged prostate with lower urinary tract symptoms: Secondary | ICD-10-CM | POA: Diagnosis not present

## 2022-03-06 DIAGNOSIS — Z8042 Family history of malignant neoplasm of prostate: Secondary | ICD-10-CM | POA: Diagnosis not present

## 2022-03-06 DIAGNOSIS — R350 Frequency of micturition: Secondary | ICD-10-CM | POA: Diagnosis not present

## 2022-03-20 ENCOUNTER — Other Ambulatory Visit: Payer: Self-pay | Admitting: Cardiology

## 2022-03-21 ENCOUNTER — Encounter: Payer: Self-pay | Admitting: Sports Medicine

## 2022-03-21 ENCOUNTER — Ambulatory Visit (INDEPENDENT_AMBULATORY_CARE_PROVIDER_SITE_OTHER): Payer: Medicare Other

## 2022-03-21 ENCOUNTER — Ambulatory Visit (INDEPENDENT_AMBULATORY_CARE_PROVIDER_SITE_OTHER): Payer: Medicare Other | Admitting: Sports Medicine

## 2022-03-21 DIAGNOSIS — N401 Enlarged prostate with lower urinary tract symptoms: Secondary | ICD-10-CM | POA: Diagnosis not present

## 2022-03-21 DIAGNOSIS — Z96641 Presence of right artificial hip joint: Secondary | ICD-10-CM

## 2022-03-21 DIAGNOSIS — I951 Orthostatic hypotension: Secondary | ICD-10-CM | POA: Diagnosis not present

## 2022-03-21 DIAGNOSIS — M25551 Pain in right hip: Secondary | ICD-10-CM | POA: Diagnosis not present

## 2022-03-21 DIAGNOSIS — I2510 Atherosclerotic heart disease of native coronary artery without angina pectoris: Secondary | ICD-10-CM | POA: Diagnosis not present

## 2022-03-21 DIAGNOSIS — M14671 Charcot's joint, right ankle and foot: Secondary | ICD-10-CM

## 2022-03-21 DIAGNOSIS — R296 Repeated falls: Secondary | ICD-10-CM | POA: Diagnosis not present

## 2022-03-21 NOTE — Progress Notes (Signed)
    Procedures performed today:    None.  Independent interpretation of notes and tests performed by another provider:   None.  Brief History, Exam, Impression, and Recommendations:    Charcot's joint of foot, right Barnabas Lister does have Charcot foot with right flatfoot deformity, peripheral neuropathy on nerve conduction study, he did see Dr. [name] with foot and ankle surgery suggested there was not a good surgical answer to this. Principal complaint was discomfort and burning, and now some foot drop. We added Lyrica and he may be feeling a bit better, he just charted 50 mg 3 times daily. I have advised him that if he does not notice good improvement to let me know on MyChart and we will bump him up to 75-100 3 times daily. Considering his right-sided foot drop and tripping, as well as failure of a prior plastic and leather foot brace I would like him to get a carbon fiber AFO on the right. He is currently ambulatory.  History of total right hip arthroplasty Having some weakness right hip with difficulty hip flexion. Adding some x-rays, and he will do home right hip physical therapy, I would like to see him back in about 6 weeks and if not stronger to hip flexion on the right we will put him into formal therapy.  BPH (benign prostatic hyperplasia) BPH with obstructive uropathy symptoms, he was having orthostatic type symptoms, I held Flomax for about a week or 2, he did not notice any improvement in his symptoms and so restarted. He has been advised to touch base with his cardiologist regarding this as well. I would like him to again stop the Flomax and stay off of it until his follow-up with me.  Orthostatic hypotension Fairly severe orthostatic hypotension, he is on a few medications that can precipitate this including isosorbide mononitrate, Lasix, furosemide, candesartan, Flomax. We will go ahead and completely discontinue his Flomax for now.   I spent 30 minutes of total time  managing this patient today, this includes chart review, face to face, and non-face to face time.  ____________________________________________ [name]. [name], M.D., ABFM., CAQSM., AME. Primary Care and Sports Medicine [facility]  Adjunct Professor of [employer]  Risk manager

## 2022-03-21 NOTE — Assessment & Plan Note (Signed)
Levi [name] does have Charcot foot with right flatfoot deformity, peripheral neuropathy on nerve conduction study, he did see Dr. [name] with foot and ankle surgery suggested there was not a good surgical answer to this. Principal complaint was discomfort and burning, and now some foot drop. We added Lyrica and he may be feeling a bit better, he just charted 50 mg 3 times daily. I have advised him that if he does not notice good improvement to let me know on MyChart and we will bump him up to 75-100 3 times daily. Considering his right-sided foot drop and tripping, as well as failure of a prior plastic and leather foot brace I would like him to get a carbon fiber AFO on the right. He is currently ambulatory.

## 2022-03-21 NOTE — Assessment & Plan Note (Addendum)
BPH with obstructive uropathy symptoms, he was having orthostatic type symptoms, I held Flomax for about a week or 2, he did not notice any improvement in his symptoms and so restarted. He has been advised to touch base with his cardiologist regarding this as well. I would like him to again stop the Flomax and stay off of it until his follow-up with me.

## 2022-03-21 NOTE — Assessment & Plan Note (Signed)
Fairly severe orthostatic hypotension, he is on a few medications that can precipitate this including isosorbide mononitrate, Lasix, furosemide, candesartan, Flomax. We will go ahead and completely discontinue his Flomax for now.

## 2022-03-21 NOTE — Assessment & Plan Note (Signed)
Having some weakness right hip with difficulty hip flexion. Adding some x-rays, and he will do home right hip physical therapy, I would like to see him back in about 6 weeks and if not stronger to hip flexion on the right we will put him into formal therapy.

## 2022-03-27 ENCOUNTER — Telehealth: Payer: Self-pay | Admitting: Internal Medicine

## 2022-03-27 MED ORDER — AMOXICILLIN-POT CLAVULANATE 875-125 MG PO TABS: 1.0000 | ORAL_TABLET | Freq: Two times a day (BID) | ORAL | 0 refills | Status: DC

## 2022-03-27 NOTE — Telephone Encounter (Signed)
Primary Pulmonologist: Young Last office visit and with whom: 09/20/2021 Dr. Annamaria Boots What do we see them for (pulmonary problems): Asthma, LV dysfunction Last OV assessment/plan:   Assessment & Plan:         Assessment & Plan Note by Deneise Lever, MD at 10/07/2021 7:13 PM  Author: Deneise Lever, MD Author Type: Physician Filed: 10/07/2021  7:13 PM  Note Status: Written Cosign: Cosign Not Required Encounter Date: 09/20/2021  Problem: LV dysfunction  Editor: Deneise Lever, MD (Physician)               Peripheral edema. Managed by cardiology        Assessment & Plan Note by Deneise Lever, MD at 10/07/2021 7:11 PM  Author: Deneise Lever, MD Author Type: Physician Filed: 10/07/2021  7:12 PM  Note Status: Written Cosign: Cosign Not Required Encounter Date: 09/20/2021  Problem: Asthma, moderate persistent  Editor: Deneise Lever, MD (Physician)               Uncomplicated, controlled on current meds Symptom overlap with chronic CHF        Patient Instructions by Deneise Lever, MD at 09/20/2021 9:00 AM  Author: Deneise Lever, MD Author Type: Physician Filed: 09/20/2021  9:17 AM  Note Status: Signed Cosign: Cosign Not Required Encounter Date: 09/20/2021  Editor: Deneise Lever, MD (Physician)               I hope they can make progress for you with your heart and your esophagus.   Please call if we can help       Orthostatic Vitals Recorded in This Encounter   09/20/2021  0857     Patient Position: Sitting  BP Location: Left Arm  Cuff Size: Normal   Instructions    Return in about 1 year (around 09/21/2022).  I hope they can make progress for you with your heart and your esophagus.   Please call if we can help      Was appointment offered to patient (explain)?  No, none available   Reason for call: pt states he is coughing and not feeling well. with green phlegym(when he can produce) . pt is asking for rx-augmentin.pt states he hasnt tried  any otc meds.  States he has been feeling bad for a few days, cough with a little of green mucous when he is able to get it up.  He has a little bit of increased sob than what is normal for him.  He denies any fever, chills or body aches.  He used his albuterol inhaler last night and this morning and got good results.  He has not had to use his nebulizer, has not used any OTC cough medication or mucinex.  He is requesting an antibiotic.  Dr. Annamaria Boots, Please advise.  Thank you.  (examples of things to ask: : When did symptoms start? Fever? Cough? Productive? Color to sputum? More sputum than usual? Wheezing? Have you needed increased oxygen? Are you taking your respiratory medications? What over the counter measures have you tried?)  Allergies  Allergen Reactions   Morphine And Related Palpitations and Other (See Comments)    Made patient light-headed and sweaty; "my heart races" (04/28/2012), pt states too much morphine gave him this reaction   Levaquin [Levofloxacin] Itching    Immunization History  Administered Date(s) Administered   Fluad Quad(high Dose 65+) 02/17/2019   H1N1 03/24/2008   Influenza Split 02/09/2009, 01/30/2011, 02/06/2012, 01/14/2021  Influenza Whole 02/09/2009, 01/25/2010, 02/24/2010   Influenza, High Dose Seasonal PF 02/18/2020   Influenza,inj,Quad PF,6+ Mos 02/08/2013, 02/28/2014, 02/13/2015, 01/22/2016, 01/30/2017, 01/22/2022   Influenza-Unspecified 02/08/2013, 02/28/2014, 02/13/2015, 01/22/2016, 01/30/2017, 01/24/2020   PFIZER(Purple Top)SARS-COV-2 Vaccination 07/03/2019, 07/24/2019, 01/24/2020, 08/30/2020   Pneumococcal Conjugate-13 04/27/2015   Pneumococcal Polysaccharide-23 05/27/2002, 02/10/2009, 01/06/2013   Td 05/27/2002, 01/14/2020

## 2022-03-27 NOTE — Telephone Encounter (Signed)
Called and spoke with patient, provided recommendations per Dr. Annamaria Boots. He verbalized understanding.  Verified pharmacy and script sent.  Nothing further needed.

## 2022-03-27 NOTE — Telephone Encounter (Signed)
Please send augmentin '875mg'$ , # 14  1 twice daily

## 2022-04-04 ENCOUNTER — Ambulatory Visit (INDEPENDENT_AMBULATORY_CARE_PROVIDER_SITE_OTHER): Payer: Medicare Other

## 2022-04-04 ENCOUNTER — Ambulatory Visit (INDEPENDENT_AMBULATORY_CARE_PROVIDER_SITE_OTHER): Payer: Medicare Other | Admitting: Sports Medicine

## 2022-04-04 ENCOUNTER — Encounter: Payer: Self-pay | Admitting: Sports Medicine

## 2022-04-04 VITALS — Wt 198.0 lb

## 2022-04-04 DIAGNOSIS — M7989 Other specified soft tissue disorders: Secondary | ICD-10-CM | POA: Diagnosis not present

## 2022-04-04 DIAGNOSIS — Z96641 Presence of right artificial hip joint: Secondary | ICD-10-CM | POA: Diagnosis not present

## 2022-04-04 DIAGNOSIS — M778 Other enthesopathies, not elsewhere classified: Secondary | ICD-10-CM | POA: Diagnosis not present

## 2022-04-04 DIAGNOSIS — I2510 Atherosclerotic heart disease of native coronary artery without angina pectoris: Secondary | ICD-10-CM | POA: Diagnosis not present

## 2022-04-04 DIAGNOSIS — G6289 Other specified polyneuropathies: Secondary | ICD-10-CM | POA: Diagnosis not present

## 2022-04-04 DIAGNOSIS — M7021 Olecranon bursitis, right elbow: Secondary | ICD-10-CM

## 2022-04-04 DIAGNOSIS — I951 Orthostatic hypotension: Secondary | ICD-10-CM

## 2022-04-04 DIAGNOSIS — R42 Dizziness and giddiness: Secondary | ICD-10-CM | POA: Diagnosis not present

## 2022-04-04 DIAGNOSIS — M19021 Primary osteoarthritis, right elbow: Secondary | ICD-10-CM | POA: Diagnosis not present

## 2022-04-04 MED ORDER — DOXYCYCLINE HYCLATE 100 MG PO TABS: 100.0000 mg | ORAL_TABLET | Freq: Two times a day (BID) | ORAL | 0 refills | Status: AC

## 2022-04-04 MED ORDER — PREDNISONE 50 MG PO TABS: ORAL_TABLET | ORAL | 0 refills | Status: DC

## 2022-04-04 NOTE — Assessment & Plan Note (Signed)
Continues to have weakness right hip, difficulty with flexion, still present but lesser so on the left. X-rays did show uncomplicated arthroplasty positioning. Due to continued and persistent weakness I would like him to do formal physical therapy. Etiology is likely multifactorial, and may certainly be related to his lumbar radiculopathy as well.

## 2022-04-04 NOTE — Assessment & Plan Note (Signed)
Levi [name] is a very pleasant 76 year old male, he has recent onset pain and swelling posterior elbow on the olecranon, starting to improve on its own. On exam he has tenderness at the olecranon, it is fairly hot to palpation, and slightly erythematous. Elbow motion is good, stability is good. Suspect septic versus crystalline olecranon bursitis. There is not enough swelling in the olecranon bursa to do an aspiration for crystal analysis so we will treat as both, adding doxycycline, 5 days of prednisone, x-rays, return to see me 4 weeks as needed.

## 2022-04-04 NOTE — Progress Notes (Signed)
    Procedures performed today:    None.  Independent interpretation of notes and tests performed by another provider:   None.  Brief History, Exam, Impression, and Recommendations:    Olecranon bursitis, right elbow Levi [name] is a very pleasant 76 year old male, he has recent onset pain and swelling posterior elbow on the olecranon, starting to improve on its own. On exam he has tenderness at the olecranon, it is fairly hot to palpation, and slightly erythematous. Elbow motion is good, stability is good. Suspect septic versus crystalline olecranon bursitis. There is not enough swelling in the olecranon bursa to do an aspiration for crystal analysis so we will treat as both, adding doxycycline, 5 days of prednisone, x-rays, return to see me 4 weeks as needed.  Orthostatic hypotension Levi [name] is on multiple medications that can precipitate orthostasis including isosorbide mononitrate, Lasix, furosemide, candesartan, we did discontinue his Flomax at the last visit, he feels that he can live with the orthostasis that he has now. He will keep an eye out on worsening of urinary obstructive symptoms.  Peripheral neuropathy EMG nerve conduction study confirmed mild bilateral lumbosacral radiculopathy, mainly bilateral L4, L5 and S1, also length dependent axonal sensorimotor polyneuropathy and Charcot foot on the right. Currently doing Lyrica 50 mg 3 times daily, unsure if he has any improvement yet, he will let me know prior to Thanksgiving if we need to go up to 75 mg 3 times daily. He understands we are going to do a slow up taper.  Vertigo Gets occasional episodes of what sound to be vertigo though he has difficulty describing it in detail. I would like him to do some vestibular rehab with therapy for now. We can revisit this in 6 weeks.  History of total right hip arthroplasty Continues to have weakness right hip, difficulty with flexion, still present but lesser so on the left. X-rays did  show uncomplicated arthroplasty positioning. Due to continued and persistent weakness I would like him to do formal physical therapy. Etiology is likely multifactorial, and may certainly be related to his lumbar radiculopathy as well.    ____________________________________________ [name]. [name], M.D., ABFM., CAQSM., AME. Primary Care and Sports Medicine [facility]  Adjunct Professor of [place] [employer] [facility] [employer]  Risk manager

## 2022-04-04 NOTE — Assessment & Plan Note (Signed)
EMG nerve conduction study confirmed mild bilateral lumbosacral radiculopathy, mainly bilateral L4, L5 and S1, also length dependent axonal sensorimotor polyneuropathy and Charcot foot on the right. Currently doing Lyrica 50 mg 3 times daily, unsure if he has any improvement yet, he will let me know prior to Thanksgiving if we need to go up to 75 mg 3 times daily. He understands we are going to do a slow up taper.

## 2022-04-04 NOTE — Assessment & Plan Note (Signed)
Levi [name] is on multiple medications that can precipitate orthostasis including isosorbide mononitrate, Lasix, furosemide, candesartan, we did discontinue his Flomax at the last visit, he feels that he can live with the orthostasis that he has now. He will keep an eye out on worsening of urinary obstructive symptoms.

## 2022-04-04 NOTE — Assessment & Plan Note (Signed)
Gets occasional episodes of what sound to be vertigo though he has difficulty describing it in detail. I would like him to do some vestibular rehab with therapy for now. We can revisit this in 6 weeks.

## 2022-04-10 ENCOUNTER — Other Ambulatory Visit: Payer: Self-pay

## 2022-04-10 ENCOUNTER — Ambulatory Visit: Payer: Medicare Other | Attending: Sports Medicine | Admitting: Physical Therapy

## 2022-04-10 DIAGNOSIS — G629 Polyneuropathy, unspecified: Secondary | ICD-10-CM | POA: Insufficient documentation

## 2022-04-10 DIAGNOSIS — R2689 Other abnormalities of gait and mobility: Secondary | ICD-10-CM

## 2022-04-10 DIAGNOSIS — R269 Unspecified abnormalities of gait and mobility: Secondary | ICD-10-CM | POA: Insufficient documentation

## 2022-04-10 DIAGNOSIS — M6281 Muscle weakness (generalized): Secondary | ICD-10-CM

## 2022-04-10 DIAGNOSIS — R296 Repeated falls: Secondary | ICD-10-CM | POA: Insufficient documentation

## 2022-04-10 DIAGNOSIS — R2681 Unsteadiness on feet: Secondary | ICD-10-CM

## 2022-04-10 DIAGNOSIS — M25559 Pain in unspecified hip: Secondary | ICD-10-CM | POA: Diagnosis not present

## 2022-04-10 DIAGNOSIS — R42 Dizziness and giddiness: Secondary | ICD-10-CM | POA: Diagnosis not present

## 2022-04-10 DIAGNOSIS — R293 Abnormal posture: Secondary | ICD-10-CM | POA: Diagnosis not present

## 2022-04-10 DIAGNOSIS — Z96641 Presence of right artificial hip joint: Secondary | ICD-10-CM | POA: Insufficient documentation

## 2022-04-10 DIAGNOSIS — R262 Difficulty in walking, not elsewhere classified: Secondary | ICD-10-CM | POA: Diagnosis not present

## 2022-04-10 NOTE — Therapy (Signed)
OUTPATIENT PHYSICAL THERAPY VESTIBULAR EVALUATION     Patient Name: Levi [name] MRN: [id number] DOB:23-Jul-1945, 76 y.o., male Today's Date: 04/10/2022   PT End of Session - 04/10/22 0853     Visit Number 1    Number of Visits 16    Date for PT Re-Evaluation 06/05/22    Authorization Type Medicare    PT Start Time 0850    PT Stop Time 0930    PT Time Calculation (min) 40 min    Activity Tolerance Patient tolerated treatment well    Behavior During Therapy WFL for tasks assessed/performed             Past Medical History:  Diagnosis Date   Agent orange exposure 1970's   takes Imdur,Diltiazem, and Atacand daily   ALLERGIC RHINITIS    Allergy    seasonal allergies   Asthma    uses inhaler and nebulizers   Bruises easily    d/t prednisone daily   Cataract    CHF (congestive heart failure) ([place])    dx-on meds to treat   Chronic bronchitis ([place])    "used to get it q yr; last time ?2008" (04/28/2012)   COPD (chronic obstructive pulmonary disease) ([facility])    agent orange exposure   Degenerative disk disease    "everywhere" (04/28/2012)   Diverticulitis    Dysrhythmia    afib   Elevated uric acid in blood    takes Allopurinol daily   Emphysema of lung ([place])    Enlarged prostate    but not on any meds   GERD (gastroesophageal reflux disease)    takes Protonix daily   H/O hiatal hernia    Headache    History of pneumonia    a. 2010   Hyperlipidemia    takes Lipitor daily; pt. states he takes a preventive   Hypothyroidism    Joint pain    Joint swelling    Neuromuscular disorder ([place])    bilat legs, and bilat arms   PAF (paroxysmal atrial fibrillation) ([place])    a. Dx 04/2012, CHA2DS2VASc = 1 (age);  b. 04/2012 Echo: EF 40-50%, mild MR.   Personal history of colonic adenomas 02/09/2013   Pneumonia    Shortness of breath dyspnea    Thyroid disease    no taking meds-caused dizziness   Past Surgical History:  Procedure Laterality Date   38 HOUR [place] STUDY N/A  05/30/2021   Procedure: 24 HOUR [place] STUDY;  Surgeon: Lavena Bullion, DO;  Location: [facility];  Service: Gastroenterology;  Laterality: N/A;   ANTERIOR CERVICAL DECOMP/DISCECTOMY FUSION N/A 07/21/2012   Procedure: ANTERIOR CERVICAL DECOMPRESSION/DISCECTOMY FUSION 2 LEVELS;  Surgeon: [name], MD;  Location: [place] [facility];  Service: Neurosurgery;  Laterality: N/A;  C4-5 C5-6 Anterior cervical decompression/diskectomy/fusion   ANTERIOR LAT LUMBAR FUSION N/A 04/07/2017   Procedure: Thoracic twelve-Lumbar one Anterolateral decompression/fusion;  Surgeon: [name], MD;  Location: [place];  Service: Neurosurgery;  Laterality: N/A;   BACK SURGERY     x 3   CARDIAC CATHETERIZATION  11/2008   [name] - 20% calcified non flow limiting left main, 50% EF apical hypokinesis   CERVICAL DISC ARTHROPLASTY N/A 04/07/2017   Procedure: Cervical six-seven Disc arthroplasty;  Surgeon: [name], MD;  Location: [place];  Service: Neurosurgery;  Laterality: N/A;   CHEST TUBE INSERTION Left 04/11/2017   Procedure: CHEST TUBE INSERTION;  Surgeon: [name], MD;  Location: [place];  Service: Thoracic;  Laterality: Left;  CHOLECYSTECTOMY     COLONOSCOPY  2016   CG-MAC-miralax(good)servere TICS/TA x 2   ESOPHAGEAL MANOMETRY N/A 05/30/2021   Procedure: ESOPHAGEAL MANOMETRY (EM);  Surgeon: Lavena Bullion, DO;  [facility]: [facility];  Service: Gastroenterology;  Laterality: N/A;  ph impedence   ESOPHAGOGASTRODUODENOSCOPY     EYE SURGERY Bilateral 2011   steroidal encapsulation" (04/28/2012)   FOOT SURGERY Right    x 2   KNEE ARTHROSCOPY Bilateral    LATERAL / POSTERIOR COMBINED FUSION LUMBAR SPINE  2012   LEFT ATRIAL APPENDAGE OCCLUSION N/A 02/22/2021   Procedure: LEFT ATRIAL APPENDAGE OCCLUSION;  Surgeon: Vickie Epley, MD;  Location: [facility] [facility];  Service: Cardiovascular;  Laterality: N/A;   POLYPECTOMY  2016   severe TICS/TA x 2   POSTERIOR CERVICAL LAMINECTOMY WITH MET- RX  Right 06/11/2021   Procedure: Right Cervical six-seven  Laminectomy and foraminotomy with metrex;  Surgeon: [name], MD;  Location: [place];  Service: Neurosurgery;  Laterality: Right;   REVERSE SHOULDER ARTHROPLASTY  04/28/2012   Procedure: REVERSE SHOULDER ARTHROPLASTY;  Surgeon: [name], MD;  Location: [place];  Service: Orthopedics;  Laterality: Left;  Left shouder reverse total shoulder arthroplasty   SHOULDER SURGERY     TEE WITHOUT CARDIOVERSION N/A 02/22/2021   Procedure: TRANSESOPHAGEAL ECHOCARDIOGRAM (TEE);  Surgeon: Vickie Epley, MD;  Location: [place] [facility];  Service: Cardiovascular;  Laterality: N/A;   TENDON REPAIR Right 08/07/2020   Procedure: RIGHT HAND LIGAMENT RECONSTRUCTION AND TENDON INTERPOSITION;  Surgeon: [name], MD;  Location: [facility];  Service: Orthopedics;  Laterality: Right;   [place]   TOTAL HIP ARTHROPLASTY Left 2011   "left" (04/28/2012)   TOTAL HIP ARTHROPLASTY Right 07/31/2015   Procedure: TOTAL HIP ARTHROPLASTY ANTERIOR APPROACH;  Surgeon: [name], MD;  Location: [facility];  Service: Orthopedics;  Laterality: Right;   TOTAL KNEE ARTHROPLASTY  01/10/2012   Procedure: TOTAL KNEE ARTHROPLASTY;  Surgeon: [name], MD;  Location: [place] [facility];  Service: Orthopedics;;  left total knee arthroplasty   TOTAL KNEE ARTHROPLASTY Right 06/17/2014   Procedure: TOTAL KNEE ARTHROPLASTY;  Surgeon: [name], MD;  Location: [place];  Service: Orthopedics;  Laterality: Right;   toupet fundolplication  97/94/8016   Patient Active Problem List   Diagnosis Date Noted   Olecranon bursitis, right elbow 04/04/2022   Vertigo 04/04/2022   Orthostatic hypotension 03/21/2022   BPH (benign prostatic hyperplasia) 02/21/2022   Annual physical exam 01/22/2022   Charcot's joint of foot, right 01/22/2022   Lower extremity edema 08/08/2021   Heartburn    Atypical chest pain    Cervical radicular pain  06/11/2021   Chronic anticoagulation 03/15/2021   Peripheral neuropathy 02/22/2021   LV dysfunction 02/22/2021   Essential (primary) hypertension 11/15/2020   Arthritis of carpometacarpal [facility]) joint of right thumb 08/07/2020   Cellulitis 03/22/2020   COPD (chronic obstructive pulmonary disease) ([place]) 03/21/2020   Chest wall pain 09/21/2019   GERD (gastroesophageal reflux disease) status post Nissen fundoplication 55/37/4827   Pneumothorax, left 04/11/2017   Lumbar stenosis with neurogenic claudication 04/07/2017   History of total right hip arthroplasty 07/31/2015   Primary osteoarthritis of right knee 06/17/2014   Thrush of mouth and esophagus ([place]) 04/03/2014   Palpitation 03/28/2014   Pseudoarthrosis of lumbar spine 06/15/2013   Hyperlipidemia    History of colonic polyps 02/09/2013   Atrial fibrillation ([place]) 05/11/2012   Osteoarthritis of left knee 01/10/2012   Seasonal  and perennial allergic rhinitis 07/23/2010   Asthma, moderate persistent 03/06/2009    PCP: n/a REFERRING PROVIDER: Silverio Decamp, MD  REFERRING DIAG: R42 (ICD-10-CM) - Vertigo [phone] (ICD-10-CM) - History of total right hip arthroplasty  THERAPY DIAG:  No diagnosis found.  ONSET DATE: Reports last 2 years things have been worsening  Rationale for Evaluation and Treatment: Rehabilitation  SUBJECTIVE:   SUBJECTIVE STATEMENT: Pt reports R hip surgery was performed 10 years ago. Pt states that R LE remains weak. Last broke his hip 3 years ago. Pt has history of peripheral neuropathy resulting in a fall breaking his hip. Pt still goes to the gym regularly. Pt states he's had dizziness for multiple years (2-3 years ago) -- thinks this may be from a slight stroke he's had. Pt notes history of TIAs.  Pt accompanied by: self  PERTINENT HISTORY: Detached R retina, history of multiple rib fractures, charcot foot on R LE, peripheral neuropathy  PAIN:  Are you having pain? Yes: NPRS scale: 6 at worst,  currently 4/10 Pain location: Back of hip Pain description: Ache Aggravating factors: walking/weight bearing Relieving factors: Exercises will loosen it  PRECAUTIONS: None  WEIGHT BEARING RESTRICTIONS: No  FALLS: Has patient fallen in last 6 months? Yes. Number of falls 15  LIVING ENVIRONMENT: Lives with: lives with their spouse Lives in: House/apartment Stairs: Yes: Internal: 15 steps; on right going up and External: 4 steps; on right going up Has following equipment at home: None  PLOF: Independent  PATIENT GOALS: Improve balance, increase strength  OBJECTIVE:   DIAGNOSTIC FINDINGS: n/a  COGNITION: Overall cognitive status: Within functional limits for tasks assessed   SENSATION: R foot neuropathy severe, L foot moderate  EDEMA:  none  MUSCLE TONE:  Did not assess  POSTURE:  forward head  Cervical ROM:    Active A/PROM (deg) eval  Flexion   Extension   Right lateral flexion   Left lateral flexion   Right rotation   Left rotation   (Blank rows = not tested)   LOWER EXTREMITY MMT:   MMT Right eval Left eval  Hip flexion 3+ 4+  Hip abduction 3 4+  Hip adduction 3 4+  Hip internal rotation    Hip external rotation    Knee flexion 4 5  Knee extension 4 5  Ankle dorsiflexion    Ankle plantarflexion    Ankle inversion    Ankle eversion    (Blank rows = not tested)   GAIT: Gait pattern: step through pattern, decreased step length- Right, decreased step length- Left, decreased stance time- Right, decreased hip/knee flexion- Right, antalgic, lateral lean- Right, lateral lean- Left, and wide BOS Distance walked: 150' Assistive device utilized: None Level of assistance: Complete Independence Comments: Unsteady  FUNCTIONAL TESTS:  5 times sit to stand: 13 sec with use of UEs  PATIENT SURVEYS:  Did not assess  VESTIBULAR ASSESSMENT:  GENERAL OBSERVATION: n/a   SYMPTOM BEHAVIOR:  Subjective history: Reports ongoing 3-4 years.    Non-Vestibular symptoms: neck pain  Type of dizziness: Lightheadedness/Faint and "everything looks on a slant, can't keep your eyes open"  Frequency: "It depends" can have one daily and go 3 weeks without an episode  Duration: 1 min (last episode); normally lasts 10-15 min  Aggravating factors: Spontaneous  Relieving factors: closing eyes and rest  Progression of symptoms: unchanged  OCULOMOTOR EXAM:  Ocular Alignment: normal  Ocular ROM: No Limitations  Spontaneous Nystagmus: absent  Gaze-Induced Nystagmus: absent  Smooth Pursuits: intact  Saccades: intact  FRENZEL - FIXATION SUPRESSED: Unable to assess  VESTIBULAR - OCULAR REFLEX:   Slow VOR: Normal  VOR Cancellation: Normal  Head-Impulse Test: HIT Right: negative HIT Left: negative  Dynamic Visual Acuity: Not able to be assessed   POSITIONAL TESTING: Right Dix-Hallpike: no nystagmus Left Dix-Hallpike: no nystagmus  MOTION SENSITIVITY:  Motion Sensitivity Quotient Intensity: 0 = none, 1 = Lightheaded, 2 = Mild, 3 = Moderate, 4 = Severe, 5 = Vomiting  Intensity  1. Sitting to supine   2. Supine to L side   3. Supine to R side   4. Supine to sitting   5. L Hallpike-Dix   6. Up from L    7. R Hallpike-Dix   8. Up from R    9. Sitting, head tipped to L knee   10. Head up from L knee   11. Sitting, head tipped to R knee   12. Head up from R knee   13. Sitting head turns x5   14.Sitting head nods x5   15. In stance, 180 turn to L    16. In stance, 180 turn to R     OTHOSTATICS: not done  FUNCTIONAL GAIT: Berg Balance Scale: 21/56  [facility] PT Assessment - 04/10/22 0001       Standardized Balance Assessment   Standardized Balance Assessment Berg Balance Test      Berg Balance Test   Sit to Stand Able to stand  independently using hands    Standing Unsupported Able to stand 30 seconds unsupported    Sitting with Back Unsupported but Feet Supported on Floor or Stool Able to sit safely and securely 2 minutes     Stand to Sit Controls descent by using hands    Transfers Able to transfer safely, definite need of hands    Standing Unsupported with Eyes Closed Able to stand 3 seconds    Standing Unsupported with Feet Together Needs help to attain position but able to stand for 30 seconds with feet together    From Standing, Reach Forward with Outstretched Arm Loses balance while trying/requires external support    From Standing Position, Pick up Object from Floor Unable to try/needs assist to keep balance    From Standing Position, Turn to Look Behind Over each Shoulder Needs supervision when turning    Turn 360 Degrees Needs close supervision or verbal cueing    Standing Unsupported, Alternately Place Feet on Step/Stool Needs assistance to keep from falling or unable to try    Standing Unsupported, One Foot in Front Needs help to step but can hold 15 seconds    Standing on One Leg Unable to try or needs assist to prevent fall    Total Score 21              PATIENT EDUCATION: Education details: Exam findings, POC, HEP Person educated: Patient Education method: Explanation and Demonstration Education comprehension: verbalized understanding, returned demonstration, and needs further education  HOME EXERCISE PROGRAM: Will initiate next session  GOALS: Goals reviewed with patient? Yes  SHORT TERM GOALS: Target date: 05/08/2022   Pt will be ind with initial HEP Baseline: Goal status: INITIAL  2.  Pt will demo at least 8 point increase in Berg Balance Score to demo [place] Baseline:  Goal status: INITIAL  3.  Pt will be able to perform sit<>stand without UE support to demo increasing functional strength Baseline:  Goal status: INITIAL    LONG TERM GOALS: Target date: 06/05/2022  Pt will be able to return to gym for strengthening for continued community wellness Baseline:  Goal status: INITIAL  2.  Pt will be able to perform 5x STS in </=13 sec without UE use to demo increased  functional strength Baseline:  Goal status: INITIAL  3.  Pt will have improved Berg Balance Score to at least 45/56 Baseline: 21/56 Goal status: INITIAL  4.  Pt will be able to negotiate curbs without LOB for community mobility Baseline:  Goal status: INITIAL  5.  Pt will be able to ascend/descend steps in reciprocal gait pattern to demo improving hip mobility and strength Baseline:  Goal status: INITIAL   ASSESSMENT:  CLINICAL IMPRESSION: Patient is a 76 y.o. M who was seen today for physical therapy evaluation and treatment for vertigo and R LE weakness. Vestibular evaluation did not yield any overt BPPV this session or any vertiginous symptoms. Pt does demonstrate very poor R LE strength and decreased balance. Pt's Berg Balance score of 21/56 indicates a very high fall risk. Pt would benefit from PT to address these issues for safer home and community mobility (pt endorses multiple falls each week) with less risk of injury.  OBJECTIVE IMPAIRMENTS: Abnormal gait, decreased activity tolerance, decreased balance, decreased mobility, difficulty walking, decreased ROM, decreased strength, dizziness, postural dysfunction, and pain.   ACTIVITY LIMITATIONS: lifting, bending, standing, squatting, stairs, transfers, toileting, dressing, and locomotion level  PARTICIPATION LIMITATIONS: shopping and community activity  PERSONAL FACTORS: Age, Past/current experiences, and Time since onset of injury/illness/exacerbation are also affecting patient's functional outcome.   REHAB POTENTIAL: Good  CLINICAL DECISION MAKING: Evolving/moderate complexity  EVALUATION COMPLEXITY: Moderate   PLAN:  PT FREQUENCY: 2x/week  PT DURATION: 8 weeks  PLANNED INTERVENTIONS: Therapeutic exercises, Therapeutic activity, Neuromuscular re-education, Balance training, Gait training, Patient/Family education, Self Care, Joint mobilization, Stair training, Vestibular training, DME instructions, Aquatic Therapy,  Dry Needling, Electrical stimulation, Cryotherapy, Moist heat, Taping, Ionotophoresis 3m/ml Dexamethasone, Manual therapy, and Re-evaluation  PLAN FOR NEXT SESSION: Initiate balance and strengthening program.    Lan Mcneill April MGordy Levan PT, DPT 04/10/2022, 9:40 AM

## 2022-04-15 ENCOUNTER — Ambulatory Visit: Payer: Medicare Other | Admitting: Rehabilitative and Restorative Service Providers"

## 2022-04-15 ENCOUNTER — Encounter: Payer: Self-pay | Admitting: Rehabilitative and Restorative Service Providers"

## 2022-04-15 DIAGNOSIS — M25559 Pain in unspecified hip: Secondary | ICD-10-CM | POA: Diagnosis not present

## 2022-04-15 DIAGNOSIS — R269 Unspecified abnormalities of gait and mobility: Secondary | ICD-10-CM | POA: Diagnosis not present

## 2022-04-15 DIAGNOSIS — R2681 Unsteadiness on feet: Secondary | ICD-10-CM

## 2022-04-15 DIAGNOSIS — M6281 Muscle weakness (generalized): Secondary | ICD-10-CM

## 2022-04-15 DIAGNOSIS — Z96641 Presence of right artificial hip joint: Secondary | ICD-10-CM | POA: Diagnosis not present

## 2022-04-15 DIAGNOSIS — R42 Dizziness and giddiness: Secondary | ICD-10-CM | POA: Diagnosis not present

## 2022-04-15 DIAGNOSIS — G629 Polyneuropathy, unspecified: Secondary | ICD-10-CM | POA: Diagnosis not present

## 2022-04-15 DIAGNOSIS — R262 Difficulty in walking, not elsewhere classified: Secondary | ICD-10-CM | POA: Diagnosis not present

## 2022-04-15 DIAGNOSIS — R2689 Other abnormalities of gait and mobility: Secondary | ICD-10-CM

## 2022-04-15 NOTE — Therapy (Signed)
OUTPATIENT PHYSICAL THERAPY LOWER EXTREMITY STRENGTHENING TREATMENT     Patient Name: Levi [name] MRN: [id number] DOB:1946-03-12, 76 y.o., male Today's Date: 04/15/2022   PT End of Session - 04/15/22 1524     Visit Number 2    Number of Visits 16    Date for PT Re-Evaluation 06/05/22    Authorization Type Medicare    Authorization - Visit Number 2    Progress Note Due on Visit 10    PT Start Time 1530    PT Stop Time 1615    PT Time Calculation (min) 45 min             Past Medical History:  Diagnosis Date   Agent orange exposure 1970's   takes Imdur,Diltiazem, and Atacand daily   ALLERGIC RHINITIS    Allergy    seasonal allergies   Asthma    uses inhaler and nebulizers   Bruises easily    d/t prednisone daily   Cataract    CHF (congestive heart failure) ([place])    dx-on meds to treat   Chronic bronchitis ([place])    "used to get it q yr; last time ?2008" (04/28/2012)   COPD (chronic obstructive pulmonary disease) ([facility])    agent orange exposure   Degenerative disk disease    "everywhere" (04/28/2012)   Diverticulitis    Dysrhythmia    afib   Elevated uric acid in blood    takes Allopurinol daily   Emphysema of lung ([place])    Enlarged prostate    but not on any meds   GERD (gastroesophageal reflux disease)    takes Protonix daily   H/O hiatal hernia    Headache    History of pneumonia    a. 2010   Hyperlipidemia    takes Lipitor daily; pt. states he takes a preventive   Hypothyroidism    Joint pain    Joint swelling    Neuromuscular disorder ([place])    bilat legs, and bilat arms   PAF (paroxysmal atrial fibrillation) ([place])    a. Dx 04/2012, CHA2DS2VASc = 1 (age);  b. 04/2012 Echo: EF 40-50%, mild MR.   Personal history of colonic adenomas 02/09/2013   Pneumonia    Shortness of breath dyspnea    Thyroid disease    no taking meds-caused dizziness   Past Surgical History:  Procedure Laterality Date   48 HOUR [place] STUDY N/A 05/30/2021   Procedure: 24  HOUR [place] STUDY;  Surgeon: Lavena Bullion, DO;  Location: [facility];  Service: Gastroenterology;  Laterality: N/A;   ANTERIOR CERVICAL DECOMP/DISCECTOMY FUSION N/A 07/21/2012   Procedure: ANTERIOR CERVICAL DECOMPRESSION/DISCECTOMY FUSION 2 LEVELS;  Surgeon: [name], MD;  Location: [facility];  Service: Neurosurgery;  Laterality: N/A;  C4-5 C5-6 Anterior cervical decompression/diskectomy/fusion   ANTERIOR LAT LUMBAR FUSION N/A 04/07/2017   Procedure: Thoracic twelve-Lumbar one Anterolateral decompression/fusion;  Surgeon: [name], MD;  Location: [facility];  Service: Neurosurgery;  Laterality: N/A;   BACK SURGERY     x 3   CARDIAC CATHETERIZATION  11/2008   [name] - 20% calcified non flow limiting left main, 50% EF apical hypokinesis   CERVICAL DISC ARTHROPLASTY N/A 04/07/2017   Procedure: Cervical six-seven Disc arthroplasty;  Surgeon: [name], MD;  Location: [facility];  Service: Neurosurgery;  Laterality: N/A;   CHEST TUBE INSERTION Left 04/11/2017   Procedure: CHEST TUBE INSERTION;  Surgeon: [name], MD;  Location: [facility];  Service: Thoracic;  Laterality: Left;  CHOLECYSTECTOMY     COLONOSCOPY  2016   CG-MAC-miralax(good)servere TICS/TA x 2   ESOPHAGEAL MANOMETRY N/A 05/30/2021   Procedure: ESOPHAGEAL MANOMETRY (EM);  Surgeon: Lavena Bullion, DO;  Location: [facility];  Service: Gastroenterology;  Laterality: N/A;  ph impedence   ESOPHAGOGASTRODUODENOSCOPY     EYE SURGERY Bilateral 2011   steroidal encapsulation" (04/28/2012)   FOOT SURGERY Right    x 2   KNEE ARTHROSCOPY Bilateral    LATERAL / POSTERIOR COMBINED FUSION LUMBAR SPINE  2012   LEFT ATRIAL APPENDAGE OCCLUSION N/A 02/22/2021   Procedure: LEFT ATRIAL APPENDAGE OCCLUSION;  Surgeon: Vickie Epley, MD;  Location: [facility];  Service: Cardiovascular;  Laterality: N/A;   POLYPECTOMY  2016   severe TICS/TA x 2   POSTERIOR CERVICAL LAMINECTOMY WITH MET- RX Right 06/11/2021    Procedure: Right Cervical six-seven  Laminectomy and foraminotomy with metrex;  Surgeon: [name], MD;  Location: [facility];  Service: Neurosurgery;  Laterality: Right;   REVERSE SHOULDER ARTHROPLASTY  04/28/2012   Procedure: REVERSE SHOULDER ARTHROPLASTY;  Surgeon: [name], MD;  Location: [facility];  Service: Orthopedics;  Laterality: Left;  Left shouder reverse total shoulder arthroplasty   SHOULDER SURGERY     TEE WITHOUT CARDIOVERSION N/A 02/22/2021   Procedure: TRANSESOPHAGEAL ECHOCARDIOGRAM (TEE);  Surgeon: Vickie Epley, MD;  Location: [facility];  Service: Cardiovascular;  Laterality: N/A;   TENDON REPAIR Right 08/07/2020   Procedure: RIGHT HAND LIGAMENT RECONSTRUCTION AND TENDON INTERPOSITION;  Surgeon: [name], MD;  Location: [facility];  Service: Orthopedics;  Laterality: Right;   [place]   TOTAL HIP ARTHROPLASTY Left 2011   "left" (04/28/2012)   TOTAL HIP ARTHROPLASTY Right 07/31/2015   Procedure: TOTAL HIP ARTHROPLASTY ANTERIOR APPROACH;  Surgeon: [name], MD;  Location: [facility];  Service: Orthopedics;  Laterality: Right;   TOTAL KNEE ARTHROPLASTY  01/10/2012   Procedure: TOTAL KNEE ARTHROPLASTY;  Surgeon: [name], MD;  Location: [facility];  Service: Orthopedics;;  left total knee arthroplasty   TOTAL KNEE ARTHROPLASTY Right 06/17/2014   Procedure: TOTAL KNEE ARTHROPLASTY;  Surgeon: [name], MD;  Location: [facility];  Service: Orthopedics;  Laterality: Right;   toupet fundolplication  20/25/4270   Patient Active Problem List   Diagnosis Date Noted   Olecranon bursitis, right elbow 04/04/2022   Vertigo 04/04/2022   Orthostatic hypotension 03/21/2022   BPH (benign prostatic hyperplasia) 02/21/2022   Annual physical exam 01/22/2022   Charcot's joint of foot, right 01/22/2022   Lower extremity edema 08/08/2021   Heartburn    Atypical chest pain    Cervical radicular pain 06/11/2021   Chronic  anticoagulation 03/15/2021   Peripheral neuropathy 02/22/2021   LV dysfunction 02/22/2021   Essential (primary) hypertension 11/15/2020   Arthritis of carpometacarpal [facility]) joint of right thumb 08/07/2020   Cellulitis 03/22/2020   COPD (chronic obstructive pulmonary disease) ([place]) 03/21/2020   Chest wall pain 09/21/2019   GERD (gastroesophageal reflux disease) status post Nissen fundoplication 62/37/6283   Pneumothorax, left 04/11/2017   Lumbar stenosis with neurogenic claudication 04/07/2017   History of total right hip arthroplasty 07/31/2015   Primary osteoarthritis of right knee 06/17/2014   Thrush of mouth and esophagus ([place]) 04/03/2014   Palpitation 03/28/2014   Pseudoarthrosis of lumbar spine 06/15/2013   Hyperlipidemia    History of colonic polyps 02/09/2013   Atrial fibrillation ([place]) 05/11/2012   Osteoarthritis of left knee 01/10/2012   Seasonal  and perennial allergic rhinitis 07/23/2010   Asthma, moderate persistent 03/06/2009    PCP: n/a REFERRING PROVIDER: Silverio Decamp, MD  REFERRING DIAG: R42 (ICD-10-CM) - Vertigo [phone] (ICD-10-CM) - History of total right hip arthroplasty  THERAPY DIAG:  Unsteadiness on feet  Muscle weakness (generalized)  Difficulty in walking, not elsewhere classified  Other abnormalities of gait and mobility  ONSET DATE: Reports last 2 years things have been worsening  Rationale for Evaluation and Treatment: Rehabilitation  SUBJECTIVE:   SUBJECTIVE STATEMENT: 04/15/22: Patient reports that he always has pain in the Rt hip. The orthoptist says his hip replacement has possibly "come loose". He has had 46 surgeries - bilat hips, knees and spine surgeries. He has had 2 strokes.    EVAL: Pt reports R hip surgery was performed 10 years ago. Pt states that R LE remains weak. Last broke his hip 3 years ago. Pt has history of peripheral neuropathy resulting in a fall breaking his hip. Pt still goes to the gym regularly. Pt states  he's had dizziness for multiple years (2-3 years ago) -- thinks this may be from a slight stroke he's had. Pt notes history of TIAs.  Pt accompanied by: self  PERTINENT HISTORY: Detached R retina, history of multiple rib fractures, charcot foot on R LE, peripheral neuropathy  PAIN:  Are you having pain? Yes: NPRS scale: 6 at worst, currently 4/10 Pain location: Back of hip Pain description: Ache Aggravating factors: walking/weight bearing Relieving factors: Exercises will loosen it   FALLS: Has patient fallen in last 6 months? Yes. Number of falls 15  LIVING ENVIRONMENT: Lives with: lives with their spouse Lives in: House/apartment Stairs: Yes: Internal: 15 steps; on right going up and External: 4 steps; on right going up Has following equipment at home: None  PLOF: Independent  PATIENT GOALS: Improve balance, increase strength  OBJECTIVE:  TREATMENT:   Nustep L5 x 15mn   Bridging 10 sec hold x 10   Hip abduction isometric green TB x 10 each LE   Sidelying clamshell green TB x 10 each side   Hip adduction ball squeeze 10 sec x 10   Sit to stand from ~ 24 inch table x 10 no UE assist   Wall squat 5 sec x 10   SLS UE support at counter as needed 20 sec x 5 each     DIAGNOSTIC FINDINGS: n/a  COGNITION: Overall cognitive status: Within functional limits for tasks assessed   SENSATION: R foot neuropathy severe, L foot moderate  EDEMA:  none  MUSCLE TONE:  Did not assess  POSTURE:  forward head  Cervical ROM:    Active A/PROM (deg) eval  Flexion   Extension   Right lateral flexion   Left lateral flexion   Right rotation   Left rotation   (Blank rows = not tested)   LOWER EXTREMITY MMT:   MMT Right eval Left eval  Hip flexion 3+ 4+  Hip abduction 3 4+  Hip adduction 3 4+  Hip internal rotation    Hip external rotation    Knee flexion 4 5  Knee extension 4 5  Ankle dorsiflexion    Ankle plantarflexion    Ankle inversion    Ankle eversion     (Blank rows = not tested)   GAIT: Gait pattern: step through pattern, decreased step length- Right, decreased step length- Left, decreased stance time- Right, decreased hip/knee flexion- Right, antalgic, lateral lean- Right, lateral lean- Left, and wide BOS Distance walked:  150' Assistive device utilized: None Level of assistance: Complete Independence Comments: Unsteady  FUNCTIONAL TESTS:  5 times sit to stand: 13 sec with use of UEs  PATIENT SURVEYS:  Did not assess  VESTIBULAR ASSESSMENT:  GENERAL OBSERVATION: n/a   SYMPTOM BEHAVIOR:  Subjective history: Reports ongoing 3-4 years.   Non-Vestibular symptoms: neck pain  Type of dizziness: Lightheadedness/Faint and "everything looks on a slant, can't keep your eyes open"  Frequency: "It depends" can have one daily and go 3 weeks without an episode  Duration: 1 min (last episode); normally lasts 10-15 min  Aggravating factors: Spontaneous  Relieving factors: closing eyes and rest  Progression of symptoms: unchanged  OCULOMOTOR EXAM:  Ocular Alignment: normal  Ocular ROM: No Limitations  Spontaneous Nystagmus: absent  Gaze-Induced Nystagmus: absent  Smooth Pursuits: intact  Saccades: intact  FRENZEL - FIXATION SUPRESSED: Unable to assess  VESTIBULAR - OCULAR REFLEX:   Slow VOR: Normal  VOR Cancellation: Normal  Head-Impulse Test: HIT Right: negative HIT Left: negative  Dynamic Visual Acuity: Not able to be assessed   POSITIONAL TESTING: Right Dix-Hallpike: no nystagmus Left Dix-Hallpike: no nystagmus  MOTION SENSITIVITY:  Motion Sensitivity Quotient Intensity: 0 = none, 1 = Lightheaded, 2 = Mild, 3 = Moderate, 4 = Severe, 5 = Vomiting  Intensity  1. Sitting to supine   2. Supine to L side   3. Supine to R side   4. Supine to sitting   5. L Hallpike-Dix   6. Up from L    7. R Hallpike-Dix   8. Up from R    9. Sitting, head tipped to L knee   10. Head up from L knee   11. Sitting, head tipped to R knee    12. Head up from R knee   13. Sitting head turns x5   14.Sitting head nods x5   15. In stance, 180 turn to L    16. In stance, 180 turn to R     OTHOSTATICS: not done  FUNCTIONAL GAIT: Berg Balance Scale: 21/56  PATIENT EDUCATION: Education details: Exam findings, POC, HEP Person educated: Patient Education method: Customer service manager Education comprehension: verbalized understanding, returned demonstration, and needs further education  HOME EXERCISE PROGRAM:  Access Code: [email or web address] URL: [email or web address] Date: 04/15/2022 Prepared by: Gillermo Murdoch  Exercises - Supine Bridge  - 2 x daily - 7 x weekly - 1-2 sets - 10 reps - 5-10 sec  hold - Hooklying Clamshell with Resistance  - 2 x daily - 7 x weekly - 1 sets - 3 reps - 30 sec  hold - Clamshell with Resistance  - 2 x daily - 7 x weekly - 2 sets - 10 reps - 3 sec  hold - Supine Hip Adduction Isometric with Ball  - 2 x daily - 7 x weekly - 1 sets - 10 reps - 10 sec  hold - Sit to Stand  - 2 x daily - 7 x weekly - 1 sets - 10 reps - 3-5 sec  hold - Wall Quarter Squat  - 2 x daily - 7 x weekly - 1-2 sets - 10 reps - 5-10 sec  hold - Standing Single Leg Stance with Counter Support  - 2 x daily - 7 x weekly - 1 sets - 3 reps - 30 sec  hold  GOALS: Goals reviewed with patient? Yes  SHORT TERM GOALS: Target date: 05/08/2022   Pt will be ind with initial HEP Baseline: Goal status: INITIAL  2.  Pt will demo at least 8 point increase in Berg Balance Score to demo [place] Baseline:  Goal status: INITIAL  3.  Pt will be able to perform sit<>stand without UE support to demo increasing functional strength Baseline:  Goal status: INITIAL    LONG TERM GOALS: Target date: 06/05/2022    Pt will be able to return to gym for strengthening for continued community wellness Baseline:  Goal status: INITIAL  2.  Pt will be able to perform 5x STS in </=13 sec without UE use to demo increased functional  strength Baseline:  Goal status: INITIAL  3.  Pt will have improved Berg Balance Score to at least 45/56 Baseline: 21/56 Goal status: INITIAL  4.  Pt will be able to negotiate curbs without LOB for community mobility Baseline:  Goal status: INITIAL  5.  Pt will be able to ascend/descend steps in reciprocal gait pattern to demo improving hip mobility and strength Baseline:  Goal status: INITIAL   ASSESSMENT:  CLINICAL IMPRESSION: 04/15/22: Patient tolerated initial strengthening and balance exercise program with brief periods of rest. Issued written home program and theraband for home.  EVAL: Patient is a 76 y.o. M who was seen today for physical therapy evaluation and treatment for vertigo and R LE weakness. Vestibular evaluation did not yield any overt BPPV this session or any vertiginous symptoms. Pt does demonstrate very poor R LE strength and decreased balance. Pt's Berg Balance score of 21/56 indicates a very high fall risk. Pt would benefit from PT to address these issues for safer home and community mobility (pt endorses multiple falls each week) with less risk of injury.  OBJECTIVE IMPAIRMENTS: Abnormal gait, decreased activity tolerance, decreased balance, decreased mobility, difficulty walking, decreased ROM, decreased strength, dizziness, postural dysfunction, and pain.   ACTIVITY LIMITATIONS: lifting, bending, standing, squatting, stairs, transfers, toileting, dressing, and locomotion level  PARTICIPATION LIMITATIONS: shopping and community activity  PERSONAL FACTORS: Age, Past/current experiences, and Time since onset of injury/illness/exacerbation are also affecting patient's functional outcome.   REHAB POTENTIAL: Good  CLINICAL DECISION MAKING: Evolving/moderate complexity  EVALUATION COMPLEXITY: Moderate   PLAN:  PT FREQUENCY: 2x/week  PT DURATION: 8 weeks  PLANNED INTERVENTIONS: Therapeutic exercises, Therapeutic activity, Neuromuscular re-education,  Balance training, Gait training, Patient/Family education, Self Care, Joint mobilization, Stair training, Vestibular training, DME instructions, Aquatic Therapy, Dry Needling, Electrical stimulation, Cryotherapy, Moist heat, Taping, Ionotophoresis 43m/ml Dexamethasone, Manual therapy, and Re-evaluation  PLAN FOR NEXT SESSION: Review and progress balance and strengthening program.    CEverardo All PT, MPH 04/15/2022, 3:24 PM

## 2022-04-17 DIAGNOSIS — M14672 Charcot's joint, left ankle and foot: Secondary | ICD-10-CM | POA: Diagnosis not present

## 2022-04-17 DIAGNOSIS — M14671 Charcot's joint, right ankle and foot: Secondary | ICD-10-CM | POA: Diagnosis not present

## 2022-04-22 ENCOUNTER — Other Ambulatory Visit: Payer: Self-pay | Admitting: Internal Medicine

## 2022-04-23 ENCOUNTER — Encounter: Payer: Self-pay | Admitting: Physical Therapy

## 2022-04-23 ENCOUNTER — Ambulatory Visit: Payer: Medicare Other | Admitting: Physical Therapy

## 2022-04-23 DIAGNOSIS — R262 Difficulty in walking, not elsewhere classified: Secondary | ICD-10-CM

## 2022-04-23 DIAGNOSIS — S60221A Contusion of right hand, initial encounter: Secondary | ICD-10-CM | POA: Diagnosis not present

## 2022-04-23 DIAGNOSIS — Z96641 Presence of right artificial hip joint: Secondary | ICD-10-CM | POA: Diagnosis not present

## 2022-04-23 DIAGNOSIS — R269 Unspecified abnormalities of gait and mobility: Secondary | ICD-10-CM | POA: Diagnosis not present

## 2022-04-23 DIAGNOSIS — R2689 Other abnormalities of gait and mobility: Secondary | ICD-10-CM

## 2022-04-23 DIAGNOSIS — M25559 Pain in unspecified hip: Secondary | ICD-10-CM | POA: Diagnosis not present

## 2022-04-23 DIAGNOSIS — R2681 Unsteadiness on feet: Secondary | ICD-10-CM

## 2022-04-23 DIAGNOSIS — G629 Polyneuropathy, unspecified: Secondary | ICD-10-CM | POA: Diagnosis not present

## 2022-04-23 DIAGNOSIS — S7001XA Contusion of right hip, initial encounter: Secondary | ICD-10-CM | POA: Diagnosis not present

## 2022-04-23 DIAGNOSIS — M6281 Muscle weakness (generalized): Secondary | ICD-10-CM

## 2022-04-23 DIAGNOSIS — S40011A Contusion of right shoulder, initial encounter: Secondary | ICD-10-CM | POA: Diagnosis not present

## 2022-04-23 DIAGNOSIS — R42 Dizziness and giddiness: Secondary | ICD-10-CM | POA: Diagnosis not present

## 2022-04-23 NOTE — Therapy (Signed)
OUTPATIENT PHYSICAL THERAPY LOWER EXTREMITY STRENGTHENING TREATMENT     Patient Name: Levi [name] MRN: [id number] DOB:01/06/1946, 76 y.o., male Today's Date: 04/23/2022   PT End of Session - 04/23/22 1019     Visit Number 3    Number of Visits 16    Date for PT Re-Evaluation 06/05/22    Authorization Type Medicare    Progress Note Due on Visit 10    PT Start Time 1019    PT Stop Time 1100    PT Time Calculation (min) 41 min    Activity Tolerance Patient tolerated treatment well    Behavior During Therapy WFL for tasks assessed/performed             Past Medical History:  Diagnosis Date   Agent orange exposure 1970's   takes Imdur,Diltiazem, and Atacand daily   ALLERGIC RHINITIS    Allergy    seasonal allergies   Asthma    uses inhaler and nebulizers   Bruises easily    d/t prednisone daily   Cataract    CHF (congestive heart failure) ([place])    dx-on meds to treat   Chronic bronchitis ([place])    "used to get it q yr; last time ?2008" (04/28/2012)   COPD (chronic obstructive pulmonary disease) ([facility])    agent orange exposure   Degenerative disk disease    "everywhere" (04/28/2012)   Diverticulitis    Dysrhythmia    afib   Elevated uric acid in blood    takes Allopurinol daily   Emphysema of lung ([place])    Enlarged prostate    but not on any meds   GERD (gastroesophageal reflux disease)    takes Protonix daily   H/O hiatal hernia    Headache    History of pneumonia    a. 2010   Hyperlipidemia    takes Lipitor daily; pt. states he takes a preventive   Hypothyroidism    Joint pain    Joint swelling    Neuromuscular disorder ([place])    bilat legs, and bilat arms   PAF (paroxysmal atrial fibrillation) ([place])    a. Dx 04/2012, CHA2DS2VASc = 1 (age);  b. 04/2012 Echo: EF 40-50%, mild MR.   Personal history of colonic adenomas 02/09/2013   Pneumonia    Shortness of breath dyspnea    Thyroid disease    no taking meds-caused dizziness   Past Surgical History:   Procedure Laterality Date   69 HOUR [place] STUDY N/A 05/30/2021   Procedure: 24 HOUR [place] STUDY;  Surgeon: Lavena Bullion, DO;  [facility]: [facility];  Service: Gastroenterology;  Laterality: N/A;   ANTERIOR CERVICAL DECOMP/DISCECTOMY FUSION N/A 07/21/2012   Procedure: ANTERIOR CERVICAL DECOMPRESSION/DISCECTOMY FUSION 2 LEVELS;  Surgeon: [name], MD;  Location: [facility];  Service: Neurosurgery;  Laterality: N/A;  C4-5 C5-6 Anterior cervical decompression/diskectomy/fusion   ANTERIOR LAT LUMBAR FUSION N/A 04/07/2017   Procedure: Thoracic twelve-Lumbar one Anterolateral decompression/fusion;  Surgeon: [name], MD;  Location: [facility];  Service: Neurosurgery;  Laterality: N/A;   BACK SURGERY     x 3   CARDIAC CATHETERIZATION  11/2008   [name] - 20% calcified non flow limiting left main, 50% EF apical hypokinesis   CERVICAL DISC ARTHROPLASTY N/A 04/07/2017   Procedure: Cervical six-seven Disc arthroplasty;  Surgeon: [name], MD;  Location: [facility];  Service: Neurosurgery;  Laterality: N/A;   CHEST TUBE INSERTION Left 04/11/2017   Procedure: CHEST TUBE INSERTION;  Surgeon: [name], MD;  Location: [facility];  Service: Thoracic;  Laterality: Left;   CHOLECYSTECTOMY     COLONOSCOPY  2016   CG-MAC-miralax(good)servere TICS/TA x 2   ESOPHAGEAL MANOMETRY N/A 05/30/2021   Procedure: ESOPHAGEAL MANOMETRY (EM);  Surgeon: Lavena Bullion, DO;  [facility];  Service: Gastroenterology;  Laterality: N/A;  ph impedence   ESOPHAGOGASTRODUODENOSCOPY     EYE SURGERY Bilateral 2011   steroidal encapsulation" (04/28/2012)   FOOT SURGERY Right    x 2   KNEE ARTHROSCOPY Bilateral    LATERAL / POSTERIOR COMBINED FUSION LUMBAR SPINE  2012   LEFT ATRIAL APPENDAGE OCCLUSION N/A 02/22/2021   Procedure: LEFT ATRIAL APPENDAGE OCCLUSION;  Surgeon: Vickie Epley, MD;  Location: [facility];  Service: Cardiovascular;  Laterality: N/A;   POLYPECTOMY  2016   severe TICS/TA  x 2   POSTERIOR CERVICAL LAMINECTOMY WITH MET- RX Right 06/11/2021   Procedure: Right Cervical six-seven  Laminectomy and foraminotomy with metrex;  Surgeon: [name], MD;  Location: [facility];  Service: Neurosurgery;  Laterality: Right;   REVERSE SHOULDER ARTHROPLASTY  04/28/2012   Procedure: REVERSE SHOULDER ARTHROPLASTY;  Surgeon: [name], MD;  Location: [facility];  Service: Orthopedics;  Laterality: Left;  Left shouder reverse total shoulder arthroplasty   SHOULDER SURGERY     TEE WITHOUT CARDIOVERSION N/A 02/22/2021   Procedure: TRANSESOPHAGEAL ECHOCARDIOGRAM (TEE);  Surgeon: Vickie Epley, MD;  Location: [facility];  Service: Cardiovascular;  Laterality: N/A;   TENDON REPAIR Right 08/07/2020   Procedure: RIGHT HAND LIGAMENT RECONSTRUCTION AND TENDON INTERPOSITION;  Surgeon: [name], MD;  Location: [facility];  Service: Orthopedics;  Laterality: Right;   [place]   TOTAL HIP ARTHROPLASTY Left 2011   "left" (04/28/2012)   TOTAL HIP ARTHROPLASTY Right 07/31/2015   Procedure: TOTAL HIP ARTHROPLASTY ANTERIOR APPROACH;  Surgeon: [name], MD;  Location: [facility];  Service: Orthopedics;  Laterality: Right;   TOTAL KNEE ARTHROPLASTY  01/10/2012   Procedure: TOTAL KNEE ARTHROPLASTY;  Surgeon: [name], MD;  Location: [facility];  Service: Orthopedics;;  left total knee arthroplasty   TOTAL KNEE ARTHROPLASTY Right 06/17/2014   Procedure: TOTAL KNEE ARTHROPLASTY;  Surgeon: [name], MD;  Location: [facility];  Service: Orthopedics;  Laterality: Right;   toupet fundolplication  76/19/5093   Patient Active Problem List   Diagnosis Date Noted   Olecranon bursitis, right elbow 04/04/2022   Vertigo 04/04/2022   Orthostatic hypotension 03/21/2022   BPH (benign prostatic hyperplasia) 02/21/2022   Annual physical exam 01/22/2022   Charcot's joint of foot, right 01/22/2022   Lower extremity edema 08/08/2021   Heartburn     Atypical chest pain    Cervical radicular pain 06/11/2021   Chronic anticoagulation 03/15/2021   Peripheral neuropathy 02/22/2021   LV dysfunction 02/22/2021   Essential (primary) hypertension 11/15/2020   Arthritis of carpometacarpal [facility]) joint of right thumb 08/07/2020   Cellulitis 03/22/2020   COPD (chronic obstructive pulmonary disease) ([employer]) 03/21/2020   Chest wall pain 09/21/2019   GERD (gastroesophageal reflux disease) status post Nissen fundoplication 26/71/2458   Pneumothorax, left 04/11/2017   Lumbar stenosis with neurogenic claudication 04/07/2017   History of total right hip arthroplasty 07/31/2015   Primary osteoarthritis of right knee 06/17/2014   Thrush of mouth and esophagus ([place]) 04/03/2014   Palpitation 03/28/2014   Pseudoarthrosis of lumbar spine 06/15/2013   Hyperlipidemia    History of colonic polyps 02/09/2013   Atrial fibrillation ([place])  05/11/2012   Osteoarthritis of left knee 01/10/2012   Seasonal and perennial allergic rhinitis 07/23/2010   Asthma, moderate persistent 03/06/2009    PCP: n/a REFERRING PROVIDER: Silverio Decamp, MD  REFERRING DIAG: R42 (ICD-10-CM) - Vertigo [phone] (ICD-10-CM) - History of total right hip arthroplasty  THERAPY DIAG:  Unsteadiness on feet  Muscle weakness (generalized)  Difficulty in walking, not elsewhere classified  Other abnormalities of gait and mobility  ONSET DATE: Reports last 2 years things have been worsening  Rationale for Evaluation and Treatment: Rehabilitation  SUBJECTIVE:   SUBJECTIVE STATEMENT: 11/28: pt states he fell on Sunday and thinks he broke his right hand. Pt is going to see Dr. Berenice Primas today to get it seen.   PERTINENT HISTORY: Detached R retina, history of multiple rib fractures, charcot foot on R LE, peripheral neuropathy  PAIN:  Are you having pain? Yes: NPRS scale: 6 at worst, currently 4/10 Pain location: Back of hip Pain description: Ache Aggravating factors:  walking/weight bearing Relieving factors: Exercises will loosen it   FALLS: Has patient fallen in last 6 months? Yes. Number of falls 15  LIVING ENVIRONMENT: Lives with: lives with their spouse Lives in: House/apartment Stairs: Yes: Internal: 15 steps; on right going up and External: 4 steps; on right going up Has following equipment at home: None  PLOF: Independent  PATIENT GOALS: Improve balance, increase strength  OBJECTIVE:  [facility] Adult PT Treatment:                                                DATE: 04/23/22 Therapeutic Exercise: Nustep L7 x 5 min Side stepping red TB 2x10 Hip ext red TB 2x10 Marching red TB 2x10 Heel raise 3x10x3 sec Toe raise 2x10 Wall slide 2x10 Sit<>stand 2x10 Neuromuscular re-ed: Romberg stance solid surface  Head nods 2x10 Head turns 2x10 Partial tandem stance 2x30 sec each Trunk rotations x10 Feet apart, EC 2x30 sec    TREATMENT:   Nustep L5 x 57mn   Bridging 10 sec hold x 10   Hip abduction isometric green TB x 10 each LE   Sidelying clamshell green TB x 10 each side   Hip adduction ball squeeze 10 sec x 10   Sit to stand from ~ 24 inch table x 10 no UE assist   Wall squat 5 sec x 10   SLS UE support at counter as needed 20 sec x 5 each   Exam findings from eval unless otherwise noted:   COGNITION: Overall cognitive status: Within functional limits for tasks assessed   SENSATION: R foot neuropathy severe, L foot moderate  LOWER EXTREMITY MMT:   MMT Right eval Left eval  Hip flexion 3+ 4+  Hip abduction 3 4+  Hip adduction 3 4+  Hip internal rotation    Hip external rotation    Knee flexion 4 5  Knee extension 4 5  Ankle dorsiflexion    Ankle plantarflexion    Ankle inversion    Ankle eversion    (Blank rows = not tested)   GAIT: Gait pattern: step through pattern, decreased step length- Right, decreased step length- Left, decreased stance time- Right, decreased hip/knee flexion- Right, antalgic, lateral lean-  Right, lateral lean- Left, and wide BOS Distance walked: 150' Assistive device utilized: None Level of assistance: Complete Independence Comments: Unsteady  FUNCTIONAL TESTS:  5 times sit  to stand: 13 sec with use of UEs Berg Balance Scale: 21/56   VESTIBULAR ASSESSMENT:   SYMPTOM BEHAVIOR:  Subjective history: Reports ongoing 3-4 years.   Non-Vestibular symptoms: neck pain  Type of dizziness: Lightheadedness/Faint and "everything looks on a slant, can't keep your eyes open"  Frequency: "It depends" can have one daily and go 3 weeks without an episode  Duration: 1 min (last episode); normally lasts 10-15 min  Aggravating factors: Spontaneous  Relieving factors: closing eyes and rest  Progression of symptoms: unchanged   PATIENT EDUCATION: Education details: Exam findings, POC, HEP Person educated: Patient Education method: Customer service manager Education comprehension: verbalized understanding, returned demonstration, and needs further education  HOME EXERCISE PROGRAM:  Access Code: [email or web address] URL: [email or web address] Date: 04/15/2022 Prepared by: Gillermo Murdoch  Exercises - Supine Bridge  - 2 x daily - 7 x weekly - 1-2 sets - 10 reps - 5-10 sec  hold - Hooklying Clamshell with Resistance  - 2 x daily - 7 x weekly - 1 sets - 3 reps - 30 sec  hold - Clamshell with Resistance  - 2 x daily - 7 x weekly - 2 sets - 10 reps - 3 sec  hold - Supine Hip Adduction Isometric with Ball  - 2 x daily - 7 x weekly - 1 sets - 10 reps - 10 sec  hold - Sit to Stand  - 2 x daily - 7 x weekly - 1 sets - 10 reps - 3-5 sec  hold - Wall Quarter Squat  - 2 x daily - 7 x weekly - 1-2 sets - 10 reps - 5-10 sec  hold - Standing Single Leg Stance with Counter Support  - 2 x daily - 7 x weekly - 1 sets - 3 reps - 30 sec  hold  GOALS: Goals reviewed with patient? Yes  SHORT TERM GOALS: Target date: 05/08/2022   Pt will be ind with initial HEP Baseline: Goal status:  INITIAL  2.  Pt will demo at least 8 point increase in Berg Balance Score to demo [place] Baseline:  Goal status: INITIAL  3.  Pt will be able to perform sit<>stand without UE support to demo increasing functional strength Baseline:  Goal status: INITIAL    LONG TERM GOALS: Target date: 06/05/2022    Pt will be able to return to gym for strengthening for continued community wellness Baseline:  Goal status: INITIAL  2.  Pt will be able to perform 5x STS in </=13 sec without UE use to demo increased functional strength Baseline:  Goal status: INITIAL  3.  Pt will have improved Berg Balance Score to at least 45/56 Baseline: 21/56 Goal status: INITIAL  4.  Pt will be able to negotiate curbs without LOB for community mobility Baseline:  Goal status: INITIAL  5.  Pt will be able to ascend/descend steps in reciprocal gait pattern to demo improving hip mobility and strength Baseline:  Goal status: INITIAL   ASSESSMENT:  CLINICAL IMPRESSION: 11/28: Continued to work on progressing strength and balance exercises. Modified pt's balance exercises accordingly. Increased trunk movements noted when pt tries to maintain balance. Pt tends to stand with his "Y" ligaments.   EVAL: Patient is a 76 y.o. M who was seen today for physical therapy evaluation and treatment for vertigo and R LE weakness. Vestibular evaluation did not yield any overt BPPV this session or any vertiginous symptoms. Pt does demonstrate very poor R LE strength and decreased balance. Pt's [employer]  score of 21/56 indicates a very high fall risk. Pt would benefit from PT to address these issues for safer home and community mobility (pt endorses multiple falls each week) with less risk of injury.  OBJECTIVE IMPAIRMENTS: Abnormal gait, decreased activity tolerance, decreased balance, decreased mobility, difficulty walking, decreased ROM, decreased strength, dizziness, postural dysfunction, and pain.   ACTIVITY LIMITATIONS:  lifting, bending, standing, squatting, stairs, transfers, toileting, dressing, and locomotion level  PARTICIPATION LIMITATIONS: shopping and community activity  PERSONAL FACTORS: Age, Past/current experiences, and Time since onset of injury/illness/exacerbation are also affecting patient's functional outcome.   REHAB POTENTIAL: Good  CLINICAL DECISION MAKING: Evolving/moderate complexity  EVALUATION COMPLEXITY: Moderate   PLAN:  PT FREQUENCY: 2x/week  PT DURATION: 8 weeks  PLANNED INTERVENTIONS: Therapeutic exercises, Therapeutic activity, Neuromuscular re-education, Balance training, Gait training, Patient/Family education, Self Care, Joint mobilization, Stair training, Vestibular training, DME instructions, Aquatic Therapy, Dry Needling, Electrical stimulation, Cryotherapy, Moist heat, Taping, Ionotophoresis 33m/ml Dexamethasone, Manual therapy, and Re-evaluation  PLAN FOR NEXT SESSION: Review and progress balance and strengthening program.    GDyanne CarrelMGordy Levan PT, DPT 04/23/2022, 10:20 AM

## 2022-04-25 ENCOUNTER — Ambulatory Visit: Payer: Medicare Other | Admitting: Physical Therapy

## 2022-04-25 ENCOUNTER — Ambulatory Visit: Payer: Medicare Other | Attending: Cardiology | Admitting: Cardiology

## 2022-04-25 ENCOUNTER — Encounter: Payer: Self-pay | Admitting: Cardiology

## 2022-04-25 ENCOUNTER — Encounter: Payer: Self-pay | Admitting: Physical Therapy

## 2022-04-25 VITALS — BP 100/60 | HR 92 | Ht 74.0 in | Wt 201.0 lb

## 2022-04-25 DIAGNOSIS — R2681 Unsteadiness on feet: Secondary | ICD-10-CM

## 2022-04-25 DIAGNOSIS — I5022 Chronic systolic (congestive) heart failure: Secondary | ICD-10-CM | POA: Diagnosis not present

## 2022-04-25 DIAGNOSIS — M6281 Muscle weakness (generalized): Secondary | ICD-10-CM

## 2022-04-25 DIAGNOSIS — M25559 Pain in unspecified hip: Secondary | ICD-10-CM | POA: Diagnosis not present

## 2022-04-25 DIAGNOSIS — I2510 Atherosclerotic heart disease of native coronary artery without angina pectoris: Secondary | ICD-10-CM | POA: Diagnosis not present

## 2022-04-25 DIAGNOSIS — R262 Difficulty in walking, not elsewhere classified: Secondary | ICD-10-CM | POA: Diagnosis not present

## 2022-04-25 DIAGNOSIS — G629 Polyneuropathy, unspecified: Secondary | ICD-10-CM | POA: Diagnosis not present

## 2022-04-25 DIAGNOSIS — I480 Paroxysmal atrial fibrillation: Secondary | ICD-10-CM | POA: Insufficient documentation

## 2022-04-25 DIAGNOSIS — R269 Unspecified abnormalities of gait and mobility: Secondary | ICD-10-CM | POA: Diagnosis not present

## 2022-04-25 DIAGNOSIS — R42 Dizziness and giddiness: Secondary | ICD-10-CM | POA: Diagnosis not present

## 2022-04-25 DIAGNOSIS — R2689 Other abnormalities of gait and mobility: Secondary | ICD-10-CM

## 2022-04-25 DIAGNOSIS — Z96641 Presence of right artificial hip joint: Secondary | ICD-10-CM | POA: Diagnosis not present

## 2022-04-25 NOTE — Patient Instructions (Signed)
Medication Instructions:  Your physician has recommended you make the following change in your medication:   ** Stop Candesartan  *If you need a refill on your cardiac medications before your next appointment, please call your pharmacy*   Lab Work: None ordered.  If you have labs (blood work) drawn today and your tests are completely normal, you will receive your results only by: [place] (if you have MyChart) OR A paper copy in the mail If you have any lab test that is abnormal or we need to change your treatment, we will call you to review the results.   Testing/Procedures: None ordered.    Follow-Up: At [facility], you and your health needs are our priority.  As part of our continuing mission to provide you with exceptional heart care, we have created designated Provider Care Teams.  These Care Teams include your primary Cardiologist (physician) and Advanced Practice Providers (APPs -  Physician Assistants and Nurse Practitioners) who all work together to provide you with the care you need, when you need it.  We recommend signing up for the patient portal called "MyChart".  Sign up information is provided on this After Visit Summary.  MyChart is used to connect with patients for Virtual Visits (Telemedicine).  Patients are able to view lab/test results, encounter notes, upcoming appointments, etc.  Non-urgent messages can be sent to your provider as well.   To learn more about what you can do with MyChart, go to [email or web address].    Your next appointment:   6 months with Dr Marlou Porch  Important Information About [place]

## 2022-04-25 NOTE — Therapy (Signed)
OUTPATIENT PHYSICAL THERAPY LOWER EXTREMITY STRENGTHENING TREATMENT     Patient Name: Levi [name] MRN: [id number] DOB:1945-07-14, 76 y.o., male Today's Date: 04/25/2022   PT End of Session - 04/25/22 1315     Visit Number 4    Number of Visits 16    Date for PT Re-Evaluation 06/05/22    Authorization Type Medicare    Progress Note Due on Visit 10    PT Start Time 1315    PT Stop Time 1400    PT Time Calculation (min) 45 min    Activity Tolerance Patient tolerated treatment well    Behavior During Therapy WFL for tasks assessed/performed             Past Medical History:  Diagnosis Date   Agent orange exposure 1970's   takes Imdur,Diltiazem, and Atacand daily   ALLERGIC RHINITIS    Allergy    seasonal allergies   Asthma    uses inhaler and nebulizers   Bruises easily    d/t prednisone daily   Cataract    CHF (congestive heart failure) ([place])    dx-on meds to treat   Chronic bronchitis ([place])    "used to get it q yr; last time ?2008" (04/28/2012)   COPD (chronic obstructive pulmonary disease) ([facility])    agent orange exposure   Degenerative disk disease    "everywhere" (04/28/2012)   Diverticulitis    Dysrhythmia    afib   Elevated uric acid in blood    takes Allopurinol daily   Emphysema of lung ([place])    Enlarged prostate    but not on any meds   GERD (gastroesophageal reflux disease)    takes Protonix daily   H/O hiatal hernia    Headache    History of pneumonia    a. 2010   Hyperlipidemia    takes Lipitor daily; pt. states he takes a preventive   Hypothyroidism    Joint pain    Joint swelling    Neuromuscular disorder ([place])    bilat legs, and bilat arms   PAF (paroxysmal atrial fibrillation) ([place])    a. Dx 04/2012, CHA2DS2VASc = 1 (age);  b. 04/2012 Echo: EF 40-50%, mild MR.   Personal history of colonic adenomas 02/09/2013   Pneumonia    Shortness of breath dyspnea    Thyroid disease    no taking meds-caused dizziness   Past Surgical History:   Procedure Laterality Date   80 HOUR [place] STUDY N/A 05/30/2021   Procedure: 24 HOUR [place] STUDY;  Surgeon: Lavena Bullion, DO;  Location: [facility];  Service: Gastroenterology;  Laterality: N/A;   ANTERIOR CERVICAL DECOMP/DISCECTOMY FUSION N/A 07/21/2012   Procedure: ANTERIOR CERVICAL DECOMPRESSION/DISCECTOMY FUSION 2 LEVELS;  Surgeon: [name], MD;  Location: [facility];  Service: Neurosurgery;  Laterality: N/A;  C4-5 C5-6 Anterior cervical decompression/diskectomy/fusion   ANTERIOR LAT LUMBAR FUSION N/A 04/07/2017   Procedure: Thoracic twelve-Lumbar one Anterolateral decompression/fusion;  Surgeon: [name], MD;  Location: [facility];  Service: Neurosurgery;  Laterality: N/A;   BACK SURGERY     x 3   CARDIAC CATHETERIZATION  11/2008   [name] - 20% calcified non flow limiting left main, 50% EF apical hypokinesis   CERVICAL DISC ARTHROPLASTY N/A 04/07/2017   Procedure: Cervical six-seven Disc arthroplasty;  Surgeon: [name], MD;  Location: [facility];  Service: Neurosurgery;  Laterality: N/A;   CHEST TUBE INSERTION Left 04/11/2017   Procedure: CHEST TUBE INSERTION;  Surgeon: [name], MD;  Location: [facility];  Service: Thoracic;  Laterality: Left;   CHOLECYSTECTOMY     COLONOSCOPY  2016   CG-MAC-miralax(good)servere TICS/TA x 2   ESOPHAGEAL MANOMETRY N/A 05/30/2021   Procedure: ESOPHAGEAL MANOMETRY (EM);  Surgeon: Lavena Bullion, DO;  [facility]: [facility];  Service: Gastroenterology;  Laterality: N/A;  ph impedence   ESOPHAGOGASTRODUODENOSCOPY     EYE SURGERY Bilateral 2011   steroidal encapsulation" (04/28/2012)   FOOT SURGERY Right    x 2   KNEE ARTHROSCOPY Bilateral    LATERAL / POSTERIOR COMBINED FUSION LUMBAR SPINE  2012   LEFT ATRIAL APPENDAGE OCCLUSION N/A 02/22/2021   Procedure: LEFT ATRIAL APPENDAGE OCCLUSION;  Surgeon: Vickie Epley, MD;  Location: [facility];  Service: Cardiovascular;  Laterality: N/A;   POLYPECTOMY  2016   severe TICS/TA  x 2   POSTERIOR CERVICAL LAMINECTOMY WITH MET- RX Right 06/11/2021   Procedure: Right Cervical six-seven  Laminectomy and foraminotomy with metrex;  Surgeon: [name], MD;  Location: [facility];  Service: Neurosurgery;  Laterality: Right;   REVERSE SHOULDER ARTHROPLASTY  04/28/2012   Procedure: REVERSE SHOULDER ARTHROPLASTY;  Surgeon: [name], MD;  Location: [facility];  Service: Orthopedics;  Laterality: Left;  Left shouder reverse total shoulder arthroplasty   SHOULDER SURGERY     TEE WITHOUT CARDIOVERSION N/A 02/22/2021   Procedure: TRANSESOPHAGEAL ECHOCARDIOGRAM (TEE);  Surgeon: Vickie Epley, MD;  Location: [facility];  Service: Cardiovascular;  Laterality: N/A;   TENDON REPAIR Right 08/07/2020   Procedure: RIGHT HAND LIGAMENT RECONSTRUCTION AND TENDON INTERPOSITION;  Surgeon: [name], MD;  Location: [facility];  Service: Orthopedics;  Laterality: Right;   [place]   TOTAL HIP ARTHROPLASTY Left 2011   "left" (04/28/2012)   TOTAL HIP ARTHROPLASTY Right 07/31/2015   Procedure: TOTAL HIP ARTHROPLASTY ANTERIOR APPROACH;  Surgeon: [name], MD;  Location: [facility];  Service: Orthopedics;  Laterality: Right;   TOTAL KNEE ARTHROPLASTY  01/10/2012   Procedure: TOTAL KNEE ARTHROPLASTY;  Surgeon: [name], MD;  Location: [facility];  Service: Orthopedics;;  left total knee arthroplasty   TOTAL KNEE ARTHROPLASTY Right 06/17/2014   Procedure: TOTAL KNEE ARTHROPLASTY;  Surgeon: [name], MD;  Location: [facility];  Service: Orthopedics;  Laterality: Right;   toupet fundolplication  76/19/5093   Patient Active Problem List   Diagnosis Date Noted   Olecranon bursitis, right elbow 04/04/2022   Vertigo 04/04/2022   Orthostatic hypotension 03/21/2022   BPH (benign prostatic hyperplasia) 02/21/2022   Annual physical exam 01/22/2022   Charcot's joint of foot, right 01/22/2022   Lower extremity edema 08/08/2021   Heartburn     Atypical chest pain    Cervical radicular pain 06/11/2021   Chronic anticoagulation 03/15/2021   Peripheral neuropathy 02/22/2021   LV dysfunction 02/22/2021   Essential (primary) hypertension 11/15/2020   Arthritis of carpometacarpal [facility]) joint of right thumb 08/07/2020   Cellulitis 03/22/2020   COPD (chronic obstructive pulmonary disease) ([employer]) 03/21/2020   Chest wall pain 09/21/2019   GERD (gastroesophageal reflux disease) status post Nissen fundoplication 26/71/2458   Pneumothorax, left 04/11/2017   Lumbar stenosis with neurogenic claudication 04/07/2017   History of total right hip arthroplasty 07/31/2015   Primary osteoarthritis of right knee 06/17/2014   Thrush of mouth and esophagus ([place]) 04/03/2014   Palpitation 03/28/2014   Pseudoarthrosis of lumbar spine 06/15/2013   Hyperlipidemia    History of colonic polyps 02/09/2013   Atrial fibrillation ([place])  05/11/2012   Osteoarthritis of left knee 01/10/2012   Seasonal and perennial allergic rhinitis 07/23/2010   Asthma, moderate persistent 03/06/2009    PCP: n/a REFERRING PROVIDER: Silverio Decamp, MD  REFERRING DIAG: R42 (ICD-10-CM) - Vertigo [phone] (ICD-10-CM) - History of total right hip arthroplasty  THERAPY DIAG:  Unsteadiness on feet  Muscle weakness (generalized)  Difficulty in walking, not elsewhere classified  Other abnormalities of gait and mobility  ONSET DATE: Reports last 2 years things have been worsening  Rationale for Evaluation and Treatment: Rehabilitation  SUBJECTIVE:   SUBJECTIVE STATEMENT: Pt states he got R hand checked out -- there is a small fracture "but nothing crazy." Pt reports he was given no precautions. No falls in the last few days.   PERTINENT HISTORY: Detached R retina, history of multiple rib fractures, charcot foot on R LE, peripheral neuropathy  PAIN:  Are you having pain? Yes: NPRS scale: 6 at worst, currently 4/10 Pain location: Back of hip Pain description:  Ache Aggravating factors: walking/weight bearing Relieving factors: Exercises will loosen it   FALLS: Has patient fallen in last 6 months? Yes. Number of falls 15  PATIENT GOALS: Improve balance, increase strength  OBJECTIVE:  [facility] Adult PT Treatment:                                                DATE: 04/25/22 Therapeutic Exercise: Nustep L7 x 5 min Side stepping green TB 3x10' Hip ext green TB 3x10' Marching standing and then seated x10 Standing Heel raise 2x10x3 sec Standing Toe raise 2x10x3 sec Seated LAQ green TB 2x10 Seated Hamstring curl green TB 2x10 Neuromuscular re-ed: Forward step tap on 6" step 2x10 each Romberg stance solid surface x30 sec Head nods 2x10 Head turns 2x10 Hip 3 way on sliders x10 each Feet apart EC x30 sec   [facility] Adult PT Treatment:                                                DATE: 04/23/22 Therapeutic Exercise: Nustep L7 x 5 min Side stepping red TB 2x10 Hip ext red TB 2x10 Marching red TB 2x10 Heel raise 3x10x3 sec Toe raise 2x10 Wall slide 2x10 Sit<>stand 2x10 Neuromuscular re-ed: Romberg stance solid surface  Head nods 2x10 Head turns 2x10 Partial tandem stance 2x30 sec each Trunk rotations x10 Feet apart, EC 2x30 sec     TREATMENT:   Nustep L5 x 30mn   Bridging 10 sec hold x 10   Hip abduction isometric green TB x 10 each LE   Sidelying clamshell green TB x 10 each side   Hip adduction ball squeeze 10 sec x 10   Sit to stand from ~ 24 inch table x 10 no UE assist   Wall squat 5 sec x 10   SLS UE support at counter as needed 20 sec x 5 each   Exam findings from eval unless otherwise noted:   SENSATION: R foot neuropathy severe, L foot moderate  LOWER EXTREMITY MMT:   MMT Right eval Left eval  Hip flexion 3+ 4+  Hip abduction 3 4+  Hip adduction 3 4+  Hip internal rotation    Hip external rotation    Knee flexion  4 5  Knee extension 4 5  Ankle dorsiflexion    Ankle plantarflexion    Ankle inversion     Ankle eversion    (Blank rows = not tested)   GAIT: Gait pattern: step through pattern, decreased step length- Right, decreased step length- Left, decreased stance time- Right, decreased hip/knee flexion- Right, antalgic, lateral lean- Right, lateral lean- Left, and wide BOS Distance walked: 150' Assistive device utilized: None Level of assistance: Complete Independence Comments: Unsteady  FUNCTIONAL TESTS:  5 times sit to stand: 13 sec with use of UEs Berg Balance Scale: 21/56   VESTIBULAR ASSESSMENT (FROM EVAL):   SYMPTOM BEHAVIOR:  Subjective history: Reports ongoing 3-4 years.   Non-Vestibular symptoms: neck pain  Type of dizziness: Lightheadedness/Faint and "everything looks on a slant, can't keep your eyes open"  Frequency: "It depends" can have one daily and go 3 weeks without an episode  Duration: 1 min (last episode); normally lasts 10-15 min  Aggravating factors: Spontaneous  Relieving factors: closing eyes and rest  Progression of symptoms: unchanged   PATIENT EDUCATION: Education details: Exam findings, POC, HEP Person educated: Patient Education method: Customer service manager Education comprehension: verbalized understanding, returned demonstration, and needs further education  HOME EXERCISE PROGRAM:  Access Code: [email or web address] URL: [email or web address] Date: 04/15/2022 Prepared by: Gillermo Murdoch  Exercises - Supine Bridge  - 2 x daily - 7 x weekly - 1-2 sets - 10 reps - 5-10 sec  hold - Hooklying Clamshell with Resistance  - 2 x daily - 7 x weekly - 1 sets - 3 reps - 30 sec  hold - Clamshell with Resistance  - 2 x daily - 7 x weekly - 2 sets - 10 reps - 3 sec  hold - Supine Hip Adduction Isometric with Ball  - 2 x daily - 7 x weekly - 1 sets - 10 reps - 10 sec  hold - Sit to Stand  - 2 x daily - 7 x weekly - 1 sets - 10 reps - 3-5 sec  hold - Wall Quarter Squat  - 2 x daily - 7 x weekly - 1-2 sets - 10 reps - 5-10 sec  hold - Standing  Single Leg Stance with Counter Support  - 2 x daily - 7 x weekly - 1 sets - 3 reps - 30 sec  hold  GOALS: Goals reviewed with patient? Yes  SHORT TERM GOALS: Target date: 05/08/2022   Pt will be ind with initial HEP Baseline: Goal status: INITIAL  2.  Pt will demo at least 8 point increase in Berg Balance Score to demo [place] Baseline:  Goal status: INITIAL  3.  Pt will be able to perform sit<>stand without UE support to demo increasing functional strength Baseline:  Goal status: INITIAL    LONG TERM GOALS: Target date: 06/05/2022    Pt will be able to return to gym for strengthening for continued community wellness Baseline:  Goal status: INITIAL  2.  Pt will be able to perform 5x STS in </=13 sec without UE use to demo increased functional strength Baseline:  Goal status: INITIAL  3.  Pt will have improved Berg Balance Score to at least 45/56 Baseline: 21/56 Goal status: INITIAL  4.  Pt will be able to negotiate curbs without LOB for community mobility Baseline:  Goal status: INITIAL  5.  Pt will be able to ascend/descend steps in reciprocal gait pattern to demo improving hip mobility and strength Baseline:  Goal status: INITIAL  ASSESSMENT:  CLINICAL IMPRESSION: Continued to work on R LE strengthening and progressing balance. Pt continues to make progress towards his goals.   OBJECTIVE IMPAIRMENTS: Abnormal gait, decreased activity tolerance, decreased balance, decreased mobility, difficulty walking, decreased ROM, decreased strength, dizziness, postural dysfunction, and pain.   ACTIVITY LIMITATIONS: lifting, bending, standing, squatting, stairs, transfers, toileting, dressing, and locomotion level  PARTICIPATION LIMITATIONS: shopping and community activity  PERSONAL FACTORS: Age, Past/current experiences, and Time since onset of injury/illness/exacerbation are also affecting patient's functional outcome.   REHAB POTENTIAL: Good  CLINICAL DECISION MAKING:  Evolving/moderate complexity  EVALUATION COMPLEXITY: Moderate   PLAN:  PT FREQUENCY: 2x/week  PT DURATION: 8 weeks  PLANNED INTERVENTIONS: Therapeutic exercises, Therapeutic activity, Neuromuscular re-education, Balance training, Gait training, Patient/Family education, Self Care, Joint mobilization, Stair training, Vestibular training, DME instructions, Aquatic Therapy, Dry Needling, Electrical stimulation, Cryotherapy, Moist heat, Taping, Ionotophoresis 11m/ml Dexamethasone, Manual therapy, and Re-evaluation  PLAN FOR NEXT SESSION: Review and progress balance and strengthening program.    GDyanne CarrelMGordy Levan PT, DPT 04/25/2022, 1:15 PM

## 2022-04-25 NOTE — Progress Notes (Signed)
Cardiology Office Note:    Date:  04/25/2022   ID:  Levi [name], DOB 24-Jan-1946, MRN [id number]  PCP:  Silverio Decamp, MD   [facility] Cardiologist:  Candee Furbish, MD Electrophysiologist:  Vickie Epley, MD     Referring MD: Shon Baton, MD   History of Present Illness:    Levi [name] is a 76 y.o. male here for paroxysmal atrial fibrillation follow-up, post watchman device attempt. Bad neuropathy. [place] broke ribs and hips. Prednisone. Weight gain.   Few skips at times.  Utilizing a cane.  Has had some dizziness at times when getting up, blood pressure has been quite low.  We stopped his candesartan on 04/25/2022.  Watchman device was attempted however interatrial septum was highly redundant and transseptal puncture was too close to the outside wall of the left atrium to be safely performed. Therefore watchman attempt was aborted.  robotic Nissen 11/01/2021 at [facility] by Dr. Reino Bellis   He presented to the ED 07/20/21 with a skin tear on his right lower leg caused by a fall earlier that day. He had an additional skin tear on the same leg that occurred a week prior after a fall. As there was no active bleeding, he was given Augmentin and discharged in stable condition.   He presented to the ED again 07/24/21 after a fall occurring that evening. He tripped over the foot of his bed and hit his L-side against the floor. He reports pain on his L-side especially with deep breathing and movement. He was found to have minimally displaced L 8th and 9th rib fractures with mild L basilar atelectasis and small effusion. He was discharged with oxycodone and advised to follow-up with his primary care.   On 07/27/21, he presented to the ED complaining of bilateral LE swelling over the past 3 days. He reported he was only occasionally taking Lasix. He was also concerned about the slow healing of the 2 wounds on his R leg that began to develop clear yellow discharge.  There was no evidence of DVT. He was applying daily bandages and continuing Augmentin. Augmentin was switched to doxycycline.    Past Medical History:  Diagnosis Date   Agent orange exposure 1970's   takes Imdur,Diltiazem, and Atacand daily   ALLERGIC RHINITIS    Allergy    seasonal allergies   Asthma    uses inhaler and nebulizers   Bruises easily    d/t prednisone daily   Cataract    CHF (congestive heart failure) (HCC)    dx-on meds to treat   Chronic bronchitis ([place])    "used to get it q yr; last time ?2008" (04/28/2012)   COPD (chronic obstructive pulmonary disease) (HCC)    agent orange exposure   Degenerative disk disease    "everywhere" (04/28/2012)   Diverticulitis    Dysrhythmia    afib   Elevated uric acid in blood    takes Allopurinol daily   Emphysema of lung ([place])    Enlarged prostate    but not on any meds   GERD (gastroesophageal reflux disease)    takes Protonix daily   H/O hiatal hernia    Headache    History of pneumonia    a. 2010   Hyperlipidemia    takes Lipitor daily; pt. states he takes a preventive   Hypothyroidism    Joint pain    Joint swelling    Neuromuscular disorder (HCC)    bilat legs, and  bilat arms   PAF (paroxysmal atrial fibrillation) ([place])    a. Dx 04/2012, CHA2DS2VASc = 1 (age);  b. 04/2012 Echo: EF 40-50%, mild MR.   Personal history of colonic adenomas 02/09/2013   Pneumonia    Shortness of breath dyspnea    Thyroid disease    no taking meds-caused dizziness    Past Surgical History:  Procedure Laterality Date   66 HOUR [place] STUDY N/A 05/30/2021   Procedure: 24 HOUR [place] STUDY;  Surgeon: Lavena Bullion, DO;  [facility]: [facility];  Service: Gastroenterology;  Laterality: N/A;   ANTERIOR CERVICAL DECOMP/DISCECTOMY FUSION N/A 07/21/2012   Procedure: ANTERIOR CERVICAL DECOMPRESSION/DISCECTOMY FUSION 2 LEVELS;  Surgeon: [name], MD;  Location: [facility];  Service: Neurosurgery;  Laterality: N/A;  C4-5 C5-6 Anterior  cervical decompression/diskectomy/fusion   ANTERIOR LAT LUMBAR FUSION N/A 04/07/2017   Procedure: Thoracic twelve-Lumbar one Anterolateral decompression/fusion;  Surgeon: [name], MD;  Location: [facility];  Service: Neurosurgery;  Laterality: N/A;   BACK SURGERY     x 3   CARDIAC CATHETERIZATION  11/2008   [name] - 20% calcified non flow limiting left main, 50% EF apical hypokinesis   CERVICAL DISC ARTHROPLASTY N/A 04/07/2017   Procedure: Cervical six-seven Disc arthroplasty;  Surgeon: [name], MD;  Location: [facility];  Service: Neurosurgery;  Laterality: N/A;   CHEST TUBE INSERTION Left 04/11/2017   Procedure: CHEST TUBE INSERTION;  Surgeon: [name], MD;  Location: [facility];  Service: Thoracic;  Laterality: Left;   CHOLECYSTECTOMY     COLONOSCOPY  2016   CG-MAC-miralax(good)servere TICS/TA x 2   ESOPHAGEAL MANOMETRY N/A 05/30/2021   Procedure: ESOPHAGEAL MANOMETRY (EM);  Surgeon: Lavena Bullion, DO;  [facility];  Service: Gastroenterology;  Laterality: N/A;  ph impedence   ESOPHAGOGASTRODUODENOSCOPY     EYE SURGERY Bilateral 2011   steroidal encapsulation" (04/28/2012)   FOOT SURGERY Right    x 2   KNEE ARTHROSCOPY Bilateral    LATERAL / POSTERIOR COMBINED FUSION LUMBAR SPINE  2012   LEFT ATRIAL APPENDAGE OCCLUSION N/A 02/22/2021   Procedure: LEFT ATRIAL APPENDAGE OCCLUSION;  Surgeon: Vickie Epley, MD;  Location: [facility];  Service: Cardiovascular;  Laterality: N/A;   POLYPECTOMY  2016   severe TICS/TA x 2   POSTERIOR CERVICAL LAMINECTOMY WITH MET- RX Right 06/11/2021   Procedure: Right Cervical six-seven  Laminectomy and foraminotomy with metrex;  Surgeon: [name], MD;  Location: [facility];  Service: Neurosurgery;  Laterality: Right;   REVERSE SHOULDER ARTHROPLASTY  04/28/2012   Procedure: REVERSE SHOULDER ARTHROPLASTY;  Surgeon: [name], MD;  Location: [facility];  Service: Orthopedics;  Laterality: Left;  Left shouder  reverse total shoulder arthroplasty   SHOULDER SURGERY     TEE WITHOUT CARDIOVERSION N/A 02/22/2021   Procedure: TRANSESOPHAGEAL ECHOCARDIOGRAM (TEE);  Surgeon: Vickie Epley, MD;  Location: [facility];  Service: Cardiovascular;  Laterality: N/A;   TENDON REPAIR Right 08/07/2020   Procedure: RIGHT HAND LIGAMENT RECONSTRUCTION AND TENDON INTERPOSITION;  Surgeon: [name], MD;  Location: [facility];  Service: Orthopedics;  Laterality: Right;   [place]   TOTAL HIP ARTHROPLASTY Left 2011   "left" (04/28/2012)   TOTAL HIP ARTHROPLASTY Right 07/31/2015   Procedure: TOTAL HIP ARTHROPLASTY ANTERIOR APPROACH;  Surgeon: [name], MD;  Location: [facility];  Service: Orthopedics;  Laterality: Right;   TOTAL KNEE ARTHROPLASTY  01/10/2012   Procedure: TOTAL KNEE ARTHROPLASTY;  Surgeon:  [name], MD;  Location: [facility];  Service: Orthopedics;;  left total knee arthroplasty   TOTAL KNEE ARTHROPLASTY Right 06/17/2014   Procedure: TOTAL KNEE ARTHROPLASTY;  Surgeon: [name], MD;  Location: [facility];  Service: Orthopedics;  Laterality: Right;   toupet fundolplication  57/84/6962    Current Medications: Current Meds  Medication Sig   acetaminophen (TYLENOL) 650 MG CR tablet Take 1 tablet (650 mg total) by mouth every 8 (eight) hours as needed for pain.   albuterol (VENTOLIN HFA) 108 (90 Base) MCG/ACT inhaler INHALE 2 PUFFS INTO THE LUNGS EVERY 4 HOURS AS NEEDED FOR WHEEZING OR SHORTNESS OF BREATH.   allopurinol (ZYLOPRIM) 100 MG tablet Take 100 mg by mouth daily.   amoxicillin-clavulanate (AUGMENTIN) 875-125 MG tablet Take 1 tablet by mouth 2 (two) times daily.   atorvastatin (LIPITOR) 20 MG tablet TAKE 1 TABLET BY MOUTH EVERY DAY   cyclobenzaprine (FLEXERIL) 10 MG tablet Take 1 tablet (10 mg total) by mouth 3 (three) times daily as needed for muscle spasms.   diclofenac Sodium (VOLTAREN) 1 % GEL Apply 2 g topically daily as needed (Pain).    diltiazem (CARDIZEM CD) 180 MG 24 hr capsule TAKE 1 CAPSULE BY MOUTH EVERY DAY   diltiazem (CARDIZEM) 30 MG tablet Take 30 mg by mouth as needed (heart rate over 120).   DULoxetine (CYMBALTA) 30 MG capsule Take 1 capsule (30 mg total) by mouth daily.   ELIQUIS 5 MG TABS tablet TAKE 1 TABLET BY MOUTH TWICE A DAY   furosemide (LASIX) 40 MG tablet Take 1 tablet (40 mg total) by mouth daily. As directed   ipratropium-albuterol (DUONEB) 0.5-2.5 (3) MG/3ML SOLN 1 NEBULE EVERY 6 HOURS AS NEEDED. **J45.3**   isosorbide mononitrate (IMDUR) 30 MG 24 hr tablet TAKE 1 TABLET BY MOUTH EVERY DAY   Nebulizers (COMPRESSOR/NEBULIZER) MISC Use as directed   Olodaterol HCl (STRIVERDI RESPIMAT) 2.5 MCG/ACT AERS INHALE 2 PUFFS BY MOUTH INTO THE LUNGS DAILY   pantoprazole (PROTONIX) 40 MG tablet Take 1 tablet (40 mg total) by mouth daily.   potassium chloride SA (KLOR-CON M20) 20 MEQ tablet Take 1 tablet (20 mEq total) by mouth daily.   predniSONE (DELTASONE) 10 MG tablet TAKE 2 TABLETS (20 MG TOTAL) BY MOUTH DAILY. CONTINUOUS   pregabalin (LYRICA) 50 MG capsule 1 capsule p.o. nightly for a week then twice daily for a week then 3 times daily   [DISCONTINUED] candesartan (ATACAND) 4 MG tablet TAKE 1/2 TABLETS (2 MG TOTAL) BY MOUTH DAILY.     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, Radiographer, therapeutic   Social Determinants of Health   Financial Resource Strain: Low Risk  (02/18/2022)   Overall Financial Resource Strain (CARDIA)    Difficulty of Paying Living Expenses: Not hard at all  Food Insecurity: No Food Insecurity (02/18/2022)   Hunger Vital Sign    Worried About Running Out of Food in the Last Year: Never true    [place] in the Last Year: Never true  Transportation Needs: No Transportation Needs (02/18/2022)   PRAPARE - Hydrologist (Medical): No    Lack of Transportation (Non-Medical): No  Physical Activity: Not on file  Stress: Not on file  Social Connections: Not on file     Family History: The patient's family history includes Colon cancer in his brother and another family member; Colon polyps in his brother and brother; Diabetes in his mother; Heart disease in his father; Prostate cancer in his brother. There is no history of Esophageal cancer, Rectal cancer, Stomach cancer, Pancreatic cancer, Kidney disease, Liver disease, or Neuropathy.  ROS:   Please see the history of present illness.    (+) Falls (+) Numbness/Loss of sensation (Bilateral legs) (+) Skin lesions 2x (R leg) (+) Skin redness (around lesions) (+) Bilateral edema (LE and UE) (+) Palpitations All other systems reviewed and are negative.  EKGs/Labs/Other Studies Reviewed:   The following studies were reviewed:  Lower Venous DVT Study 07/31/21 Summary:  RIGHT:  - Portions of this examination were limited- see technologist comments  above.  - There is no evidence of deep vein thrombosis in the lower extremity.  - There is no evidence of superficial venous thrombosis.     - No cystic structure found in the popliteal fossa.     LEFT:  - Portions of this examination were  limited- see technologist comments  above.  - There is no evidence of deep vein thrombosis in the lower extremity.  - There is no evidence of superficial venous thrombosis.     - No cystic structure found in the popliteal fossa.   Echo TEE (Endo) 02/22/21 1. Patient had a PFO with with aneurysmal mobile atrial septum TEE  guidance during transeptal showed puncture would be dangerous as location  away from PFO would result in puncture too close to the posterior LA wall.  Dr Quentin Ore decided to abort case due  to risk of trans septal puncture.   2. Left ventricular ejection fraction, by estimation, is 45 to 50%. The  left ventricle has mildly decreased function. The left ventricle  demonstrates global hypokinesis. The left ventricular internal cavity size  was mildly dilated.   3. Right ventricular systolic function is normal. The right ventricular  size is normal.   4. No left atrial/left atrial appendage thrombus was detected.   5. The mitral valve is abnormal. Mild mitral valve regurgitation.   6. The aortic valve is tricuspid. There is mild calcification of the  aortic valve. Aortic valve regurgitation is mild. Mild to moderate aortic  valve sclerosis/calcification is present, without any evidence of aortic  stenosis.   7. Small left to right shunt post trans septal puncture.   Echo 02/14/21  1. Left ventricular ejection fraction, by estimation, is 45 to 50%. The  left ventricle has mildly decreased function. The left ventricle  demonstrates global hypokinesis. There is mild concentric left ventricular  hypertrophy. Left ventricular diastolic  parameters are indeterminate.   2. Right ventricular systolic  function is normal. The right ventricular  size is normal.   3. Left atrial size was mildly dilated.   4. The mitral valve is normal in structure. Trivial mitral valve  regurgitation. No evidence of mitral stenosis.   5. The aortic valve is tricuspid. Aortic valve regurgitation  is trivial.  Mild aortic valve sclerosis is present, with no evidence of aortic valve  stenosis.   6. The inferior vena cava is normal in size with greater than 50%  respiratory variability, suggesting right atrial pressure of 3 mmHg.   Comparison(s): Prior images reviewed side by side.   CT Cardiac Morph with Calcium Score 01/31/21 IMPRESSION: No acute findings in the imaged extracardiac chest.  IMPRESSION: 1. The left atrial appendage is a large chicken wing morphology without thrombus.   2. A 50m Watchman FLX device is recommended based on the above landing zone measurements (25.4 mm maximum diameter; 20% compression).   3. There is no thrombus in the left atrial appendage.   4. An inferior posterior IAS puncture site is recommended.   5. Optimal deployment angle: RAO 2 CAU 15   6. Normal coronary origin. Right dominance.   7. Coronary artery calcium score is 197, which is 49th percentile for age and sex matched peers.   EKG: EKG was not ordered today  Recent Labs: 09/25/2021: ALT 13; BUN 17; Creatinine, Ser 0.76; Hemoglobin 13.2; Platelets 179; Potassium 3.6; Sodium 139  Recent Lipid Panel    Component Value Date/Time   CHOL 188 09/28/2019 1017   TRIG 69 09/28/2019 1017   HDL 109 09/28/2019 1017   CHOLHDL 1.7 09/28/2019 1017   CHOLHDL 2 10/20/2014 1122   VLDL 12.0 10/20/2014 1122   LDLCALC 66 09/28/2019 1017     Risk Assessment/Calculations:          Physical Exam:    VS:  BP 100/60 (BP Location: Left Arm, Patient Position: Sitting, Cuff Size: Normal)   Pulse 92   Ht _0  (1.88 m)   Wt 201 lb (91.2 kg)   SpO2 98%   BMI 25.81 kg/m     Wt Readings from Last 3 Encounters:  04/25/22 201 lb (91.2 kg)  04/04/22 198 lb (89.8 kg)  03/21/22 195 lb (88.5 kg)     GEN:  Well nourished, well developed in no acute distress, tall HEENT: Normal NECK: No JVD; No carotid bruits LYMPHATICS: No lymphadenopathy CARDIAC: RRR, no murmurs, rubs,  gallops RESPIRATORY:  Clear to auscultation without rales, wheezing or rhonchi  ABDOMEN: Soft, non-tender, non-distended MUSCULOSKELETAL:  Bilateral LE 1-2+ pitting edema, 2 wounds on right lower tibia with mild erythema SKIN: Warm and dry, NEUROLOGIC:  Alert and oriented x 3 PSYCHIATRIC:  Normal affect   ASSESSMENT:    1. Chronic systolic CHF (congestive heart failure) (HCC)   2. Paroxysmal atrial fibrillation (HCC)      PLAN:        Lower extremity edema His recent immobility with fall/rib fractures etc. have exacerbated edema.  His EF is 45 to 50%, only mildly reduced. Has Lasix 40 mg as directed.  Hypertension/hypotension We are going to stop his candesartan 2 mg daily.  He has blood pressures at times in the 80s and 90s and sometimes feels dizzy when getting up.  Other medicines that can affect his blood pressure or his long-acting diltiazem CD1 180 mg once a day as well as Lasix 40 mg.   Atrial fibrillation ([place] Continue with Eliquis and diltiazem at current dosing.  Well-controlled.  Occasionally will feel some rapid palpitations but this is fleeting.  He is unable to obtain Watchman device because of highly redundant interatrial septum.  Attempt was made.      Medication Adjustments/Labs and Tests Ordered: Current medicines are reviewed at length with the patient today.  Concerns regarding medicines are outlined above.  No orders of the defined types were placed in this encounter.   No orders of the defined types were placed in this encounter.    Patient Instructions  Medication Instructions:  Your physician has recommended you make the following change in your medication:   ** Stop Candesartan  *If you need a refill on your cardiac medications before your next appointment, please call your pharmacy*   Lab Work: None ordered.  If you have labs (blood work) drawn today and your tests are completely normal, you will receive your results only by: [facility] [place] (if you have MyChart) OR A paper copy in the mail If you have any lab test that is abnormal or we need to change your treatment, we will call you to review the results.   Testing/Procedures: None ordered.    Follow-Up: At [facility], you and your health needs are our priority.  As part of our continuing mission to provide you with exceptional heart care, we have created designated Provider Care Teams.  These Care Teams include your primary Cardiologist (physician) and Advanced Practice Providers (APPs -  Physician Assistants and Nurse Practitioners) who all work together to provide you with the care you need, when you need it.  We recommend signing up for the patient portal called "MyChart".  Sign up information is provided on this After Visit Summary.  MyChart is used to connect with patients for Virtual Visits (Telemedicine).  Patients are able to view lab/test results, encounter notes, upcoming appointments, etc.  Non-urgent messages can be sent to your provider as well.   To learn more about what you can do with MyChart, go to [email or web address].    Your next appointment:   6 months with Dr Marlou Porch  Important Information About [facility]         Signed, Candee Furbish, MD  04/25/2022 9:14 AM    [place]

## 2022-04-29 ENCOUNTER — Encounter: Payer: Self-pay | Admitting: Physical Therapy

## 2022-04-29 ENCOUNTER — Ambulatory Visit: Payer: Medicare Other | Attending: Sports Medicine | Admitting: Physical Therapy

## 2022-04-29 DIAGNOSIS — R2689 Other abnormalities of gait and mobility: Secondary | ICD-10-CM | POA: Diagnosis present

## 2022-04-29 DIAGNOSIS — R262 Difficulty in walking, not elsewhere classified: Secondary | ICD-10-CM | POA: Diagnosis present

## 2022-04-29 DIAGNOSIS — M6281 Muscle weakness (generalized): Secondary | ICD-10-CM | POA: Diagnosis present

## 2022-04-29 DIAGNOSIS — R2681 Unsteadiness on feet: Secondary | ICD-10-CM | POA: Insufficient documentation

## 2022-04-29 NOTE — Therapy (Addendum)
OUTPATIENT PHYSICAL THERAPY LOWER EXTREMITY STRENGTHENING TREATMENT   Patient Name: Levi [name] MRN: [id number] DOB:Oct 24, 1945, 76 y.o., male Today's Date: 04/29/2022  PHYSICAL THERAPY DISCHARGE SUMMARY  Visits from Start of Care: 5  Current functional level related to goals / functional outcomes: See below   Remaining deficits: See below   Education / Equipment: See below   Patient agrees to discharge. Patient goals were not met. Patient is being discharged due to not returning since the last visit.    PT End of Session - 04/29/22 1019     Visit Number 5    Number of Visits 16    Date for PT Re-Evaluation 06/05/22    Authorization Type Medicare    Progress Note Due on Visit 10    PT Start Time 1019    PT Stop Time 1100    PT Time Calculation (min) 41 min    Activity Tolerance Patient tolerated treatment well    Behavior During Therapy WFL for tasks assessed/performed             Past Medical History:  Diagnosis Date   Agent orange exposure 1970's   takes Imdur,Diltiazem, and Atacand daily   ALLERGIC RHINITIS    Allergy    seasonal allergies   Asthma    uses inhaler and nebulizers   Bruises easily    d/t prednisone daily   Cataract    CHF (congestive heart failure) (HCC)    dx-on meds to treat   Chronic bronchitis ([place])    "used to get it q yr; last time ?2008" (04/28/2012)   COPD (chronic obstructive pulmonary disease) (HCC)    agent orange exposure   Degenerative disk disease    "everywhere" (04/28/2012)   Diverticulitis    Dysrhythmia    afib   Elevated uric acid in blood    takes Allopurinol daily   Emphysema of lung ([place])    Enlarged prostate    but not on any meds   GERD (gastroesophageal reflux disease)    takes Protonix daily   H/O hiatal hernia    Headache    History of pneumonia    a. 2010   Hyperlipidemia    takes Lipitor daily; pt. states he takes a preventive   Hypothyroidism    Joint pain    Joint swelling    Neuromuscular  disorder ([place])    bilat legs, and bilat arms   PAF (paroxysmal atrial fibrillation) ([place])    a. Dx 04/2012, CHA2DS2VASc = 1 (age);  b. 04/2012 Echo: EF 40-50%, mild MR.   Personal history of colonic adenomas 02/09/2013   Pneumonia    Shortness of breath dyspnea    Thyroid disease    no taking meds-caused dizziness   Past Surgical History:  Procedure Laterality Date   40 HOUR [place] STUDY N/A 05/30/2021   Procedure: 24 HOUR [place] STUDY;  Surgeon: Lavena Bullion, DO;  Location: [facility];  Service: Gastroenterology;  Laterality: N/A;   ANTERIOR CERVICAL DECOMP/DISCECTOMY FUSION N/A 07/21/2012   Procedure: ANTERIOR CERVICAL DECOMPRESSION/DISCECTOMY FUSION 2 LEVELS;  Surgeon: [name], MD;  Location: [facility];  Service: Neurosurgery;  Laterality: N/A;  C4-5 C5-6 Anterior cervical decompression/diskectomy/fusion   ANTERIOR LAT LUMBAR FUSION N/A 04/07/2017   Procedure: Thoracic twelve-Lumbar one Anterolateral decompression/fusion;  Surgeon: [name], MD;  Location: [facility];  Service: Neurosurgery;  Laterality: N/A;   BACK SURGERY     x 3   CARDIAC CATHETERIZATION  11/2008   [name] -  20% calcified non flow limiting left main, 50% EF apical hypokinesis   CERVICAL DISC ARTHROPLASTY N/A 04/07/2017   Procedure: Cervical six-seven Disc arthroplasty;  Surgeon: [name], MD;  Location: [facility];  Service: Neurosurgery;  Laterality: N/A;   CHEST TUBE INSERTION Left 04/11/2017   Procedure: CHEST TUBE INSERTION;  Surgeon: [name], MD;  Location: [facility];  Service: Thoracic;  Laterality: Left;   CHOLECYSTECTOMY     COLONOSCOPY  2016   CG-MAC-miralax(good)servere TICS/TA x 2   ESOPHAGEAL MANOMETRY N/A 05/30/2021   Procedure: ESOPHAGEAL MANOMETRY (EM);  Surgeon: Lavena Bullion, DO;  Location: [facility];  Service: Gastroenterology;  Laterality: N/A;  ph impedence   ESOPHAGOGASTRODUODENOSCOPY     EYE SURGERY Bilateral 2011   steroidal encapsulation" (04/28/2012)   FOOT  SURGERY Right    x 2   KNEE ARTHROSCOPY Bilateral    LATERAL / POSTERIOR COMBINED FUSION LUMBAR SPINE  2012   LEFT ATRIAL APPENDAGE OCCLUSION N/A 02/22/2021   Procedure: LEFT ATRIAL APPENDAGE OCCLUSION;  Surgeon: Vickie Epley, MD;  Location: [facility];  Service: Cardiovascular;  Laterality: N/A;   POLYPECTOMY  2016   severe TICS/TA x 2   POSTERIOR CERVICAL LAMINECTOMY WITH MET- RX Right 06/11/2021   Procedure: Right Cervical six-seven  Laminectomy and foraminotomy with metrex;  Surgeon: [name], MD;  Location: [facility];  Service: Neurosurgery;  Laterality: Right;   REVERSE SHOULDER ARTHROPLASTY  04/28/2012   Procedure: REVERSE SHOULDER ARTHROPLASTY;  Surgeon: [name], MD;  Location: [facility];  Service: Orthopedics;  Laterality: Left;  Left shouder reverse total shoulder arthroplasty   SHOULDER SURGERY     TEE WITHOUT CARDIOVERSION N/A 02/22/2021   Procedure: TRANSESOPHAGEAL ECHOCARDIOGRAM (TEE);  Surgeon: Vickie Epley, MD;  Location: [facility];  Service: Cardiovascular;  Laterality: N/A;   TENDON REPAIR Right 08/07/2020   Procedure: RIGHT HAND LIGAMENT RECONSTRUCTION AND TENDON INTERPOSITION;  Surgeon: [name], MD;  Location: [facility];  Service: Orthopedics;  Laterality: Right;   [place]   TOTAL HIP ARTHROPLASTY Left 2011   "left" (04/28/2012)   TOTAL HIP ARTHROPLASTY Right 07/31/2015   Procedure: TOTAL HIP ARTHROPLASTY ANTERIOR APPROACH;  Surgeon: [name], MD;  Location: [facility];  Service: Orthopedics;  Laterality: Right;   TOTAL KNEE ARTHROPLASTY  01/10/2012   Procedure: TOTAL KNEE ARTHROPLASTY;  Surgeon: [name], MD;  Location: [facility];  Service: Orthopedics;;  left total knee arthroplasty   TOTAL KNEE ARTHROPLASTY Right 06/17/2014   Procedure: TOTAL KNEE ARTHROPLASTY;  Surgeon: [name], MD;  Location: [place] [facility];  Service: Orthopedics;  Laterality: Right;   toupet fundolplication   73/53/2992   Patient Active Problem List   Diagnosis Date Noted   Olecranon bursitis, right elbow 04/04/2022   Vertigo 04/04/2022   Orthostatic hypotension 03/21/2022   BPH (benign prostatic hyperplasia) 02/21/2022   Annual physical exam 01/22/2022   Charcot's joint of foot, right 01/22/2022   Lower extremity edema 08/08/2021   Heartburn    Atypical chest pain    Cervical radicular pain 06/11/2021   Chronic anticoagulation 03/15/2021   Peripheral neuropathy 02/22/2021   LV dysfunction 02/22/2021   Essential (primary) hypertension 11/15/2020   Arthritis of carpometacarpal [employer]) joint of right thumb 08/07/2020   Cellulitis 03/22/2020   COPD (chronic obstructive pulmonary disease) ([place]) 03/21/2020   Chest wall pain 09/21/2019   GERD (gastroesophageal reflux disease) status post Nissen fundoplication 42/68/3419   Pneumothorax, left 04/11/2017  Lumbar stenosis with neurogenic claudication 04/07/2017   History of total right hip arthroplasty 07/31/2015   Primary osteoarthritis of right knee 06/17/2014   Thrush of mouth and esophagus ([place]) 04/03/2014   Palpitation 03/28/2014   Pseudoarthrosis of lumbar spine 06/15/2013   Hyperlipidemia    History of colonic polyps 02/09/2013   Atrial fibrillation ([place]) 05/11/2012   Osteoarthritis of left knee 01/10/2012   Seasonal and perennial allergic rhinitis 07/23/2010   Asthma, moderate persistent 03/06/2009    PCP: n/a REFERRING PROVIDER: Silverio Decamp, MD  REFERRING DIAG: R42 (ICD-10-CM) - Vertigo [phone] (ICD-10-CM) - History of total right hip arthroplasty  THERAPY DIAG:  Unsteadiness on feet  Muscle weakness (generalized)  Difficulty in walking, not elsewhere classified  Other abnormalities of gait and mobility  ONSET DATE: Reports last 2 years things have been worsening  Rationale for Evaluation and Treatment: Rehabilitation  SUBJECTIVE:   SUBJECTIVE STATEMENT: Pt reports slipping and falling last night. He  notes hitting his right knee and R hand. R knee is very sore  PERTINENT HISTORY: Detached R retina, history of multiple rib fractures, charcot foot on R LE, peripheral neuropathy  PAIN:  Are you having pain? Yes: NPRS scale: 6 only when he's moving/10 Pain location: R lateral knee Pain description: Ache Aggravating factors: walking/weight bearing Relieving factors: Exercises will loosen it   FALLS: Has patient fallen in last 6 months? Yes. Number of falls 15  PATIENT GOALS: Improve balance, increase strength  OBJECTIVE:  [facility] Adult PT Treatment:                                                DATE: 04/29/22 Neuromuscular re-ed: Romberg stance solid surface 2x30 sec EO Head nods 2x10 Head turns 2x10 Feet apart EC 2x30 sec Standing feet apart VOR 1 head nods and then head turns; 2 x 30 sec Standing feet apart on foam 2x30 sec EO UE flexion alternating x30 sec Body turns x30 sec Alternating forward tap on 4" step 2x10 Staggered stance one foot on step 2x30 sec R&L Modalities: Vaso x10 min, R knee, 34 deg   [facility] Adult PT Treatment:                                                DATE: 04/25/22 Therapeutic Exercise: Nustep L7 x 5 min Side stepping green TB 3x10' Hip ext green TB 3x10' Marching standing and then seated x10 Standing Heel raise 2x10x3 sec Standing Toe raise 2x10x3 sec Seated LAQ green TB 2x10 Seated Hamstring curl green TB 2x10 Neuromuscular re-ed: Forward step tap on 6" step 2x10 each Romberg stance solid surface x30 sec Head nods 2x10 Head turns 2x10 Hip 3 way on sliders x10 each Feet apart EC x30 sec   [facility] Adult PT Treatment:                                                DATE: 04/23/22 Therapeutic Exercise: Nustep L7 x 5 min Side stepping red TB 2x10 Hip ext red TB 2x10 Marching red TB 2x10 Heel raise 3x10x3 sec Toe raise 2x10  Wall slide 2x10 Sit<>stand 2x10 Neuromuscular re-ed: Romberg stance solid surface  Head nods 2x10 Head turns  2x10 Partial tandem stance 2x30 sec each Trunk rotations x10 Feet apart, EC 2x30 sec    Exam findings from eval unless otherwise noted:   SENSATION: R foot neuropathy severe, L foot moderate  LOWER EXTREMITY MMT:   MMT Right eval Left eval  Hip flexion 3+ 4+  Hip abduction 3 4+  Hip adduction 3 4+  Hip internal rotation    Hip external rotation    Knee flexion 4 5  Knee extension 4 5  Ankle dorsiflexion    Ankle plantarflexion    Ankle inversion    Ankle eversion    (Blank rows = not tested)   GAIT: Gait pattern: step through pattern, decreased step length- Right, decreased step length- Left, decreased stance time- Right, decreased hip/knee flexion- Right, antalgic, lateral lean- Right, lateral lean- Left, and wide BOS Distance walked: 150' Assistive device utilized: None Level of assistance: Complete Independence Comments: Unsteady  FUNCTIONAL TESTS:  5 times sit to stand: 13 sec with use of UEs Berg Balance Scale: 21/56   VESTIBULAR ASSESSMENT (FROM EVAL):   SYMPTOM BEHAVIOR:  Subjective history: Reports ongoing 3-4 years.   Non-Vestibular symptoms: neck pain  Type of dizziness: Lightheadedness/Faint and "everything looks on a slant, can't keep your eyes open"  Frequency: "It depends" can have one daily and go 3 weeks without an episode  Duration: 1 min (last episode); normally lasts 10-15 min  Aggravating factors: Spontaneous  Relieving factors: closing eyes and rest  Progression of symptoms: unchanged   PATIENT EDUCATION: Education details: Exam findings, POC, HEP Person educated: Patient Education method: Customer service manager Education comprehension: verbalized understanding, returned demonstration, and needs further education  HOME EXERCISE PROGRAM:  Access Code: [email or web address] URL: [email or web address] Date: 04/15/2022 Prepared by: Gillermo Murdoch  Exercises - Supine Bridge  - 2 x daily - 7 x weekly - 1-2 sets - 10 reps - 5-10  sec  hold - Hooklying Clamshell with Resistance  - 2 x daily - 7 x weekly - 1 sets - 3 reps - 30 sec  hold - Clamshell with Resistance  - 2 x daily - 7 x weekly - 2 sets - 10 reps - 3 sec  hold - Supine Hip Adduction Isometric with Ball  - 2 x daily - 7 x weekly - 1 sets - 10 reps - 10 sec  hold - Sit to Stand  - 2 x daily - 7 x weekly - 1 sets - 10 reps - 3-5 sec  hold - Wall Quarter Squat  - 2 x daily - 7 x weekly - 1-2 sets - 10 reps - 5-10 sec  hold - Standing Single Leg Stance with Counter Support  - 2 x daily - 7 x weekly - 1 sets - 3 reps - 30 sec  hold  GOALS: Goals reviewed with patient? Yes  SHORT TERM GOALS: Target date: 05/08/2022   Pt will be ind with initial HEP Baseline: Goal status: INITIAL  2.  Pt will demo at least 8 point increase in Berg Balance Score to demo [place] Baseline:  Goal status: INITIAL  3.  Pt will be able to perform sit<>stand without UE support to demo increasing functional strength Baseline:  Goal status: INITIAL    LONG TERM GOALS: Target date: 06/05/2022    Pt will be able to return to gym for strengthening for continued community wellness Baseline:  Goal status: INITIAL  2.  Pt will be able to perform 5x STS in </=13 sec without UE use to demo increased functional strength Baseline:  Goal status: INITIAL  3.  Pt will have improved Berg Balance Score to at least 45/56 Baseline: 21/56 Goal status: INITIAL  4.  Pt will be able to negotiate curbs without LOB for community mobility Baseline:  Goal status: INITIAL  5.  Pt will be able to ascend/descend steps in reciprocal gait pattern to demo improving hip mobility and strength Baseline:  Goal status: INITIAL   ASSESSMENT:  CLINICAL IMPRESSION: Pt's right knee slightly bruised and tender from fall -- did not attempt too much strengthening/resistance this session. Performed primarily balance exercises. Pt able to demonstrate improved alternating forward step taps this session.    OBJECTIVE IMPAIRMENTS: Abnormal gait, decreased activity tolerance, decreased balance, decreased mobility, difficulty walking, decreased ROM, decreased strength, dizziness, postural dysfunction, and pain.   ACTIVITY LIMITATIONS: lifting, bending, standing, squatting, stairs, transfers, toileting, dressing, and locomotion level  PARTICIPATION LIMITATIONS: shopping and community activity  PERSONAL FACTORS: Age, Past/current experiences, and Time since onset of injury/illness/exacerbation are also affecting patient's functional outcome.   REHAB POTENTIAL: Good  CLINICAL DECISION MAKING: Evolving/moderate complexity  EVALUATION COMPLEXITY: Moderate   PLAN:  PT FREQUENCY: 2x/week  PT DURATION: 8 weeks  PLANNED INTERVENTIONS: Therapeutic exercises, Therapeutic activity, Neuromuscular re-education, Balance training, Gait training, Patient/Family education, Self Care, Joint mobilization, Stair training, Vestibular training, DME instructions, Aquatic Therapy, Dry Needling, Electrical stimulation, Cryotherapy, Moist heat, Taping, Ionotophoresis 72m/ml Dexamethasone, Manual therapy, and Re-evaluation  PLAN FOR NEXT SESSION: Review and progress balance and strengthening program.    GDyanne CarrelMGordy Levan PT, DPT 04/29/2022, 10:19 AM

## 2022-05-02 ENCOUNTER — Ambulatory Visit: Payer: Medicare Other | Admitting: Sports Medicine

## 2022-05-02 ENCOUNTER — Ambulatory Visit: Payer: Medicare Other | Admitting: Physical Therapy

## 2022-05-02 ENCOUNTER — Ambulatory Visit
Admission: EM | Admit: 2022-05-02 | Discharge: 2022-05-02 | Disposition: A | Discharge status: Home / Self Care | Payer: Medicare Other | Attending: Physician Assistant | Admitting: Physician Assistant

## 2022-05-02 ENCOUNTER — Encounter: Payer: Self-pay | Admitting: Emergency Medicine

## 2022-05-02 DIAGNOSIS — R42 Dizziness and giddiness: Secondary | ICD-10-CM

## 2022-05-02 LAB — POCT URINALYSIS DIP (MANUAL ENTRY)
Blood, UA: NEGATIVE
Glucose, UA: NEGATIVE mg/dL
Leukocytes, UA: NEGATIVE
Nitrite, UA: NEGATIVE
Protein Ur, POC: 30 mg/dL — AB
Spec Grav, UA: 1.025 (ref 1.010–1.025)
Urobilinogen, UA: 0.2 E.U./dL
pH, UA: 5.5 (ref 5.0–8.0)

## 2022-05-02 LAB — POCT FASTING CBG KUC MANUAL ENTRY: POCT Glucose (KUC): 112 mg/dL — AB (ref 70–99)

## 2022-05-02 NOTE — ED Triage Notes (Signed)
Patient c/o dizziness for several years, that comes and goes.  Patient is currently under the care of a neurologists.  The dizziness seems to be happening more frequently.  Also c/o visual disturbance.

## 2022-05-02 NOTE — Discharge Instructions (Signed)
Your blood sugar was appropriate.  Your urine showed no evidence of infection or significant dehydration.  You did have some fluctuation in your blood pressure when you stood up so I recommend that you drink plenty of fluid.  I also recommend that you eat small frequent meals.  Please follow-up with your specialist as scheduled.  Follow-up with your primary care next week.  If you have any changing symptoms or if anything worsens and you have recurrent dizziness, vision change, weakness, difficulty with your speech, passing out you need to go to the emergency room immediately.

## 2022-05-02 NOTE — ED Provider Notes (Signed)
[name]    CSN: [id number] Arrival date & time: 05/02/22  0817      History   Chief Complaint Chief Complaint  Patient presents with   Dizziness    HPI Levi [name] is a 76 y.o. male.   Patient presents today with a prolonged history of intermittent lightheadedness/dizziness.  Reports that over the past several weeks he has had recurrent episodes and he contacted his PCP who recommended evaluation in the urgent care.  He does have a history of a previous stroke and has had ongoing symptoms since that time.  He is followed by neurology and has an appointment scheduled in approximately 1.5 weeks.  He reports that during episodes he feels lightheaded but denies any associated syncope.  He denies any focal weakness outside of ongoing weakness on the right side from previous stroke that is at baseline.  He reports that his vision "becomes slanted" and this last for a few minutes before resolving.  He has a known history of visual disturbance in his right eye from injury in [place].  He does have a history of A-fib but can feel when he goes in and out of A-fib and is currently in a normal rhythm.  He was seen by his cardiologist approximately week ago and was noted to be in A-fib at that time.  He denies any medication changes.  Denies any recent head injury.  He reports that episodes typically last for few minutes and then resolved without intervention.  He is eating and drinking normally.  Reports that currently he is feeling well and denies any lightheadedness/dizziness/visual disturbance.  Denies any dysarthria, nausea, vomiting, focal weakness.    Past Medical History:  Diagnosis Date   Agent orange exposure 1970's   takes Imdur,Diltiazem, and Atacand daily   ALLERGIC RHINITIS    Allergy    seasonal allergies   Asthma    uses inhaler and nebulizers   Bruises easily    d/t prednisone daily   Cataract    CHF (congestive heart failure) (HCC)    dx-on meds to treat    Chronic bronchitis ([place])    "used to get it q yr; last time ?2008" (04/28/2012)   COPD (chronic obstructive pulmonary disease) (HCC)    agent orange exposure   Degenerative disk disease    "everywhere" (04/28/2012)   Diverticulitis    Dysrhythmia    afib   Elevated uric acid in blood    takes Allopurinol daily   Emphysema of lung ([place])    Enlarged prostate    but not on any meds   GERD (gastroesophageal reflux disease)    takes Protonix daily   H/O hiatal hernia    Headache    History of pneumonia    a. 2010   Hyperlipidemia    takes Lipitor daily; pt. states he takes a preventive   Hypothyroidism    Joint pain    Joint swelling    Neuromuscular disorder ([place])    bilat legs, and bilat arms   PAF (paroxysmal atrial fibrillation) ([place])    a. Dx 04/2012, CHA2DS2VASc = 1 (age);  b. 04/2012 Echo: EF 40-50%, mild MR.   Personal history of colonic adenomas 02/09/2013   Pneumonia    Shortness of breath dyspnea    Thyroid disease    no taking meds-caused dizziness    Patient Active Problem List   Diagnosis Date Noted   Olecranon bursitis, right elbow 04/04/2022   Vertigo 04/04/2022   Orthostatic  hypotension 03/21/2022   BPH (benign prostatic hyperplasia) 02/21/2022   Annual physical exam 01/22/2022   Charcot's joint of foot, right 01/22/2022   Lower extremity edema 08/08/2021   Heartburn    Atypical chest pain    Cervical radicular pain 06/11/2021   Chronic anticoagulation 03/15/2021   Peripheral neuropathy 02/22/2021   LV dysfunction 02/22/2021   Essential (primary) hypertension 11/15/2020   Arthritis of carpometacarpal [facility]) joint of right thumb 08/07/2020   Cellulitis 03/22/2020   COPD (chronic obstructive pulmonary disease) ([place]) 03/21/2020   Chest wall pain 09/21/2019   GERD (gastroesophageal reflux disease) status post Nissen fundoplication 57/84/6962   Pneumothorax, left 04/11/2017   Lumbar stenosis with neurogenic claudication 04/07/2017   History of total  right hip arthroplasty 07/31/2015   Primary osteoarthritis of right knee 06/17/2014   Thrush of mouth and esophagus ([place]) 04/03/2014   Palpitation 03/28/2014   Pseudoarthrosis of lumbar spine 06/15/2013   Hyperlipidemia    History of colonic polyps 02/09/2013   Atrial fibrillation ([place]) 05/11/2012   Osteoarthritis of left knee 01/10/2012   Seasonal and perennial allergic rhinitis 07/23/2010   Asthma, moderate persistent 03/06/2009    Past Surgical History:  Procedure Laterality Date   17 HOUR [place] STUDY N/A 05/30/2021   Procedure: 24 HOUR [place] STUDY;  Surgeon: Lavena Bullion, DO;  [facility];  Service: Gastroenterology;  Laterality: N/A;   ANTERIOR CERVICAL DECOMP/DISCECTOMY FUSION N/A 07/21/2012   Procedure: ANTERIOR CERVICAL DECOMPRESSION/DISCECTOMY FUSION 2 LEVELS;  Surgeon: [name], MD;  Location: [facility];  Service: Neurosurgery;  Laterality: N/A;  C4-5 C5-6 Anterior cervical decompression/diskectomy/fusion   ANTERIOR LAT LUMBAR FUSION N/A 04/07/2017   Procedure: Thoracic twelve-Lumbar one Anterolateral decompression/fusion;  Surgeon: [name], MD;  Location: [facility];  Service: Neurosurgery;  Laterality: N/A;   BACK SURGERY     x 3   CARDIAC CATHETERIZATION  11/2008   [name] - 20% calcified non flow limiting left main, 50% EF apical hypokinesis   CERVICAL DISC ARTHROPLASTY N/A 04/07/2017   Procedure: Cervical six-seven Disc arthroplasty;  Surgeon: [name], MD;  Location: [facility];  Service: Neurosurgery;  Laterality: N/A;   CHEST TUBE INSERTION Left 04/11/2017   Procedure: CHEST TUBE INSERTION;  Surgeon: [name], MD;  Location: [facility];  Service: Thoracic;  Laterality: Left;   CHOLECYSTECTOMY     COLONOSCOPY  2016   CG-MAC-miralax(good)servere TICS/TA x 2   ESOPHAGEAL MANOMETRY N/A 05/30/2021   Procedure: ESOPHAGEAL MANOMETRY (EM);  Surgeon: Lavena Bullion, DO;  [facility];  Service: Gastroenterology;  Laterality: N/A;  ph  impedence   ESOPHAGOGASTRODUODENOSCOPY     EYE SURGERY Bilateral 2011   steroidal encapsulation" (04/28/2012)   FOOT SURGERY Right    x 2   KNEE ARTHROSCOPY Bilateral    LATERAL / POSTERIOR COMBINED FUSION LUMBAR SPINE  2012   LEFT ATRIAL APPENDAGE OCCLUSION N/A 02/22/2021   Procedure: LEFT ATRIAL APPENDAGE OCCLUSION;  Surgeon: Vickie Epley, MD;  Location: [facility];  Service: Cardiovascular;  Laterality: N/A;   POLYPECTOMY  2016   severe TICS/TA x 2   POSTERIOR CERVICAL LAMINECTOMY WITH MET- RX Right 06/11/2021   Procedure: Right Cervical six-seven  Laminectomy and foraminotomy with metrex;  Surgeon: [name], MD;  Location: [facility];  Service: Neurosurgery;  Laterality: Right;   REVERSE SHOULDER ARTHROPLASTY  04/28/2012   Procedure: REVERSE SHOULDER ARTHROPLASTY;  Surgeon: [name], MD;  Location: [facility];  Service: Orthopedics;  Laterality: Left;  Left  shouder reverse total shoulder arthroplasty   SHOULDER SURGERY     TEE WITHOUT CARDIOVERSION N/A 02/22/2021   Procedure: TRANSESOPHAGEAL ECHOCARDIOGRAM (TEE);  Surgeon: Vickie Epley, MD;  Location: [facility];  Service: Cardiovascular;  Laterality: N/A;   TENDON REPAIR Right 08/07/2020   Procedure: RIGHT HAND LIGAMENT RECONSTRUCTION AND TENDON INTERPOSITION;  Surgeon: [name], MD;  Location: [facility];  Service: Orthopedics;  Laterality: Right;   [place]   TOTAL HIP ARTHROPLASTY Left 2011   "left" (04/28/2012)   TOTAL HIP ARTHROPLASTY Right 07/31/2015   Procedure: TOTAL HIP ARTHROPLASTY ANTERIOR APPROACH;  Surgeon: [name], MD;  Location: [facility];  Service: Orthopedics;  Laterality: Right;   TOTAL KNEE ARTHROPLASTY  01/10/2012   Procedure: TOTAL KNEE ARTHROPLASTY;  Surgeon: [name], MD;  Location: [facility];  Service: Orthopedics;;  left total knee arthroplasty   TOTAL KNEE ARTHROPLASTY Right 06/17/2014   Procedure: TOTAL KNEE ARTHROPLASTY;   Surgeon: [name], MD;  Location: [facility];  Service: Orthopedics;  Laterality: Right;   toupet fundolplication  18/86/7737       Home Medications    Prior to Admission medications   Medication Sig Start Date End Date Taking? Authorizing Provider  acetaminophen (TYLENOL) 650 MG CR tablet Take 1 tablet (650 mg total) by mouth every 8 (eight) hours as needed for pain. 01/22/22  Yes Silverio Decamp, MD  albuterol (VENTOLIN HFA) 108 (90 Base) MCG/ACT inhaler INHALE 2 PUFFS INTO THE LUNGS EVERY 4 HOURS AS NEEDED FOR WHEEZING OR SHORTNESS OF BREATH. 01/07/22  Yes Young, Clinton D, MD  allopurinol (ZYLOPRIM) 100 MG tablet Take 100 mg by mouth daily.   Yes [provider]  atorvastatin (LIPITOR) 20 MG tablet TAKE 1 TABLET BY MOUTH EVERY DAY 03/20/22  Yes Jerline Pain, MD  cyclobenzaprine (FLEXERIL) 10 MG tablet Take 1 tablet (10 mg total) by mouth 3 (three) times daily as needed for muscle spasms. 06/11/21  Yes [name], MD  diclofenac Sodium (VOLTAREN) 1 % GEL Apply 2 g topically daily as needed (Pain).   Yes [provider]  diltiazem (CARDIZEM CD) 180 MG 24 hr capsule TAKE 1 CAPSULE BY MOUTH EVERY DAY 11/20/21  Yes Jerline Pain, MD  diltiazem (CARDIZEM) 30 MG tablet Take 30 mg by mouth as needed (heart rate over 120).   Yes [provider]  DULoxetine (CYMBALTA) 30 MG capsule Take 1 capsule (30 mg total) by mouth daily. 12/04/21  Yes Lomax, Amy, NP  ELIQUIS 5 MG TABS tablet TAKE 1 TABLET BY MOUTH TWICE A DAY 02/07/22  Yes Jerline Pain, MD  furosemide (LASIX) 40 MG tablet Take 1 tablet (40 mg total) by mouth daily. As directed 08/08/21  Yes Skains, Thana Farr, MD  ipratropium-albuterol (DUONEB) 0.5-2.5 (3) MG/3ML SOLN 1 NEBULE EVERY 6 HOURS AS NEEDED. **J45.3** 03/22/20  Yes Young, Tarri Fuller D, MD  isosorbide mononitrate (IMDUR) 30 MG 24 hr tablet TAKE 1 TABLET BY MOUTH EVERY DAY 03/04/22  Yes Jerline Pain, MD  Nebulizers (COMPRESSOR/NEBULIZER) MISC Use as  directed 12/01/17  Yes Young, Tarri Fuller D, MD  Olodaterol HCl (STRIVERDI RESPIMAT) 2.5 MCG/ACT AERS INHALE 2 PUFFS BY MOUTH INTO THE LUNGS DAILY 01/18/22  Yes Baird Lyons D, MD  pantoprazole (PROTONIX) 40 MG tablet Take 1 tablet (40 mg total) by mouth daily. 03/04/22  Yes Cirigliano, Vito V, DO  potassium chloride SA (KLOR-CON M20) 20 MEQ tablet Take 1 tablet (20 mEq total)  by mouth daily. 09/05/21  Yes Jerline Pain, MD  predniSONE (DELTASONE) 10 MG tablet TAKE 2 TABLETS (20 MG TOTAL) BY MOUTH DAILY. CONTINUOUS 04/22/22  Yes Young, Tarri Fuller D, MD  pregabalin (LYRICA) 50 MG capsule 1 capsule p.o. nightly for a week then twice daily for a week then 3 times daily 02/21/22  Yes Silverio Decamp, MD  amoxicillin-clavulanate (AUGMENTIN) 875-125 MG tablet Take 1 tablet by mouth 2 (two) times daily. 03/27/22   Deneise Lever, MD    Family History Family History  Problem Relation Age of Onset   Diabetes Mother    Heart disease Father        Died 74, MI   Prostate cancer Brother    Colon polyps Brother    Colon polyps Brother    Colon cancer Brother        Dx age 14   Colon cancer Other    Esophageal cancer Neg Hx    Rectal cancer Neg Hx    Stomach cancer Neg Hx    Pancreatic cancer Neg Hx    Kidney disease Neg Hx    Liver disease Neg Hx    Neuropathy Neg Hx     Social History Social History   Tobacco Use   Smoking status: Never   Smokeless tobacco: Never  Vaping Use   Vaping Use: Never used  Substance Use Topics   Alcohol use: No    Alcohol/week: 0.0 standard drinks of alcohol   Drug use: No     Allergies   Morphine and related and Levaquin [levofloxacin]   Review of Systems Review of Systems  Constitutional:  Negative for activity change, appetite change, fatigue and fever.  Respiratory:  Negative for cough and shortness of breath.   Cardiovascular:  Negative for chest pain, palpitations and leg swelling.  Gastrointestinal:  Negative for abdominal pain, diarrhea,  nausea and vomiting.  Neurological:  Positive for dizziness and light-headedness. Negative for seizures, syncope, facial asymmetry, speech difficulty, weakness (at baseline), numbness and headaches.     Physical Exam Triage Vital Signs ED Triage Vitals  Enc Vitals Group     BP 05/02/22 0829 111/68     Pulse Rate 05/02/22 0829 91     Resp 05/02/22 0829 18     Temp 05/02/22 0829 98 F (36.7 C)     Temp Source 05/02/22 0829 Oral     SpO2 05/02/22 0829 95 %     Weight 05/02/22 0830 190 lb (86.2 kg)     Height 05/02/22 0830 _0  (1.88 m)     Head Circumference --      Peak Flow --      Pain Score 05/02/22 0830 0     Pain Loc --      Pain Edu? --      Excl. in [place]? --    Orthostatic VS for the past 24 hrs:  BP- Lying Pulse- Lying BP- Sitting Pulse- Sitting BP- Standing at 0 minutes Pulse- Standing at 0 minutes  05/02/22 0938 134/77 71 119/75 67 108/70 82    Updated Vital Signs BP 111/68 (BP Location: Right Arm)   Pulse 91   Temp 98 F (36.7 C) (Oral)   Resp 18   Ht _1  (1.88 m)   Wt 190 lb (86.2 kg)   SpO2 95%   BMI 24.39 kg/m   Visual Acuity Right Eye Distance:   Left Eye Distance:   Bilateral Distance:    Right Eye Near:  Left Eye Near:    Bilateral Near:     Physical Exam Vitals reviewed.  Constitutional:      General: He is awake.     Appearance: Normal appearance. He is well-developed. He is not ill-appearing.     Comments: Very pleasant male appears stated age in no acute distress sitting comfortably in exam room  HENT:     Head: Normocephalic and atraumatic.     Right Ear: Tympanic membrane, ear canal and external ear normal. No hemotympanum.     Left Ear: Tympanic membrane, ear canal and external ear normal. There is impacted cerumen. No hemotympanum.     Nose: Nose normal.     Mouth/Throat:     Mouth: Mucous membranes are dry.     Pharynx: Uvula midline. No oropharyngeal exudate or posterior oropharyngeal erythema.  Eyes:     Extraocular  Movements: Extraocular movements intact.     Conjunctiva/sclera: Conjunctivae normal.     Pupils: Pupils are equal, round, and reactive to light.  Cardiovascular:     Rate and Rhythm: Normal rate and regular rhythm.     Heart sounds: Normal heart sounds, S1 normal and S2 normal. No murmur heard. Pulmonary:     Effort: Pulmonary effort is normal. No accessory muscle usage or respiratory distress.     Breath sounds: Normal breath sounds. No stridor. No wheezing, rhonchi or rales.     Comments: Clear to auscultation bilaterally Musculoskeletal:     Comments: Strength 4/5 right upper and lower extremity compared to 5/5 left upper and lower extremity.  Baseline for patient.  Neurological:     General: No focal deficit present.     Mental Status: He is alert and oriented to person, place, and time.     Cranial Nerves: Cranial nerves 2-12 are intact.     Motor: Weakness (at baseline) present.     Coordination: Coordination is intact.     Gait: Gait is intact.  Psychiatric:        Behavior: Behavior is cooperative.      UC Treatments / Results  Labs (all labs ordered are listed, but only abnormal results are displayed) Labs Reviewed  POCT FASTING CBG [facility] MANUAL ENTRY - Abnormal; Notable for the following components:      Result Value   POCT Glucose ([facility]) 112 (*)    All other components within normal limits  POCT URINALYSIS DIP (MANUAL ENTRY) - Abnormal; Notable for the following components:   Color, UA other (*)    Bilirubin, UA small (*)    Ketones, POC UA trace (5) (*)    Protein Ur, POC =30 (*)    All other components within normal limits  CBC WITH DIFFERENTIAL/PLATELET  COMPREHENSIVE METABOLIC PANEL    EKG   Radiology No results found.  Procedures Procedures (including critical care time)  Medications Ordered in UC Medications - No data to display  Initial Impression / Assessment and Plan / UC Course  I have reviewed the triage vital signs and the nursing  notes.  Pertinent labs & imaging results that were available during my care of the patient were reviewed by me and considered in my medical decision making (see chart for details).     Patient is well-appearing, afebrile, nontoxic, nontachycardic.  Physical exam is reassuring today.  He reports that he is currently asymptomatic.  Blood sugars appropriate.  UA showed no evidence of infection or significant dehydration.  He did have changes in his systolic blood pressure during orthostatic  vital signs with the does not meet criteria for orthostatic hypotension.  Did discuss that this could be contributing to his symptoms and encouraged him to push fluids.  Offered EKG as part of workup patient declined this as he does not feel he is in A-fib and had an EKG recently.  Reports that he is feeling well and just came in to be checked based on the recommendation of his primary care.  Basic labs including CBC and CMP were obtained today.  He is scheduled for follow-up with his neurologist next week and was strongly encouraged to keep this appointment.  Recommended that he eat small frequent meals and push fluids.  Discussed that if he has any changing or worsening symptoms he needs to go to the emergency room immediately including persistent dizziness, vision change, weakness, dysarthria.  Strict return precautions given to which he expressed understanding.  Final Clinical Impressions(s) / UC Diagnoses   Final diagnoses:  Episodic lightheadedness     Discharge Instructions      Your blood sugar was appropriate.  Your urine showed no evidence of infection or significant dehydration.  You did have some fluctuation in your blood pressure when you stood up so I recommend that you drink plenty of fluid.  I also recommend that you eat small frequent meals.  Please follow-up with your specialist as scheduled.  Follow-up with your primary care next week.  If you have any changing symptoms or if anything worsens and  you have recurrent dizziness, vision change, weakness, difficulty with your speech, passing out you need to go to the emergency room immediately.     ED Prescriptions   None    PDMP not reviewed this encounter.   Terrilee Croak, PA-C 05/02/22 0960

## 2022-05-03 LAB — CBC WITH DIFFERENTIAL/PLATELET
Absolute Monocytes: 763 cells/uL (ref 200–950)
Basophils Absolute: 62 cells/uL (ref 0–200)
Basophils Relative: 0.5 %
Eosinophils Absolute: 25 cells/uL (ref 15–500)
Eosinophils Relative: 0.2 %
HCT: 40.4 % (ref 38.5–50.0)
Hemoglobin: 13.8 g/dL (ref 13.2–17.1)
Lymphs Abs: 1341 cells/uL (ref 850–3900)
MCH: 31.4 pg (ref 27.0–33.0)
MCHC: 34.2 g/dL (ref 32.0–36.0)
MCV: 92 fL (ref 80.0–100.0)
MPV: 12.2 fL (ref 7.5–12.5)
Monocytes Relative: 6.2 %
Neutro Abs: 10111 cells/uL — ABNORMAL HIGH (ref 1500–7800)
Neutrophils Relative %: 82.2 %
Platelets: 224 10*3/uL (ref 140–400)
RBC: 4.39 10*6/uL (ref 4.20–5.80)
RDW: 13 % (ref 11.0–15.0)
Total Lymphocyte: 10.9 %
WBC: 12.3 10*3/uL — ABNORMAL HIGH (ref 3.8–10.8)

## 2022-05-03 LAB — COMPREHENSIVE METABOLIC PANEL
AG Ratio: 2 (calc) (ref 1.0–2.5)
ALT: 19 U/L (ref 9–46)
AST: 20 U/L (ref 10–35)
Albumin: 4.3 g/dL (ref 3.6–5.1)
Alkaline phosphatase (APISO): 75 U/L (ref 35–144)
BUN: 23 mg/dL (ref 7–25)
CO2: 25 mmol/L (ref 20–32)
Calcium: 9.6 mg/dL (ref 8.6–10.3)
Chloride: 106 mmol/L (ref 98–110)
Creat: 0.94 mg/dL (ref 0.70–1.28)
Globulin: 2.1 g/dL (calc) (ref 1.9–3.7)
Glucose, Bld: 109 mg/dL — ABNORMAL HIGH (ref 65–99)
Potassium: 5.3 mmol/L (ref 3.5–5.3)
Sodium: 141 mmol/L (ref 135–146)
Total Bilirubin: 0.6 mg/dL (ref 0.2–1.2)
Total Protein: 6.4 g/dL (ref 6.1–8.1)

## 2022-05-16 ENCOUNTER — Ambulatory Visit (INDEPENDENT_AMBULATORY_CARE_PROVIDER_SITE_OTHER): Payer: Medicare Other | Admitting: Sports Medicine

## 2022-05-16 VITALS — BP 117/78 | HR 91

## 2022-05-16 DIAGNOSIS — M14671 Charcot's joint, right ankle and foot: Secondary | ICD-10-CM

## 2022-05-16 DIAGNOSIS — B029 Zoster without complications: Secondary | ICD-10-CM

## 2022-05-16 DIAGNOSIS — R42 Dizziness and giddiness: Secondary | ICD-10-CM | POA: Diagnosis not present

## 2022-05-16 DIAGNOSIS — M48062 Spinal stenosis, lumbar region with neurogenic claudication: Secondary | ICD-10-CM | POA: Diagnosis not present

## 2022-05-16 DIAGNOSIS — I2510 Atherosclerotic heart disease of native coronary artery without angina pectoris: Secondary | ICD-10-CM | POA: Diagnosis not present

## 2022-05-16 MED ORDER — PREGABALIN 75 MG PO CAPS: 75.0000 mg | ORAL_CAPSULE | Freq: Three times a day (TID) | ORAL | 3 refills | Status: DC

## 2022-05-16 MED ORDER — CYCLOBENZAPRINE HCL 10 MG PO TABS: 10.0000 mg | ORAL_TABLET | Freq: Every day | ORAL | 1 refills | Status: DC

## 2022-05-16 MED ORDER — VALACYCLOVIR HCL 1 G PO TABS: 1000.0000 mg | ORAL_TABLET | Freq: Two times a day (BID) | ORAL | 2 refills | Status: DC

## 2022-05-16 NOTE — Assessment & Plan Note (Addendum)
Levi [name] returns, he continues to complain of episodes where his vision appears like he is looking through water, and he feels like he is tilting to the side, he tells me it occurs more randomly, and does not typically happen only with changes in his head position. He declines presyncopal symptoms, declines any palpitations or chest pain during these episodes. I wanted him to do vestibular rehab but he did not. I am discussed given some information on the Epley maneuver, he can watch it on YouTube and do it at home every day. Considering underlying vasculopathy we will go ahead and get a brain MRI to ensure that he has not had an insult to his vestibular nuclei. Renal function was done earlier this month. He does have a history of a retinal detachment, does have an upcoming appointment with his ophthalmologist.

## 2022-05-16 NOTE — Assessment & Plan Note (Signed)
Known lumbar spinal stenosis, he had been using Flexeril at night, desires a refill, refill provided, risks explained.

## 2022-05-16 NOTE — Assessment & Plan Note (Signed)
Check does have peripheral neuropathy, noted on NCS, saw Foot/ankle surgery and was told there was not a good surgical answer for this. Continue Lyrica, I will bump him up to 75 mg 3 times daily. We already asked him to get a carbon fiber AFO on the right, he is currently ambulatory. He will again discuss with Dr. [name] if there is any nonsurgical options for him. Principal complaint is pain.

## 2022-05-16 NOTE — Progress Notes (Signed)
    Procedures performed today:    None.  Independent interpretation of notes and tests performed by another provider:   None.  Brief History, Exam, Impression, and Recommendations:    Vertigo Levi [name] returns, he continues to complain of episodes where his vision appears like he is looking through water, and he feels like he is tilting to the side, he tells me it occurs more randomly, and does not typically happen only with changes in his head position. He declines presyncopal symptoms, declines any palpitations or chest pain during these episodes. I wanted him to do vestibular rehab but he did not. I am discussed given some information on the Epley maneuver, he can watch it on YouTube and do it at home every day. Considering underlying vasculopathy we will go ahead and get a brain MRI to ensure that he has not had an insult to his vestibular nuclei. Renal function was done earlier this month. He does have a history of a retinal detachment, does have an upcoming appointment with his ophthalmologist.  Shingles Levi [name] has had shingles in the past, he has started to develop a burning sensation right hemithorax radiating around to the front, no rash yet. As he may be in the prodromal phase we will go ahead and add Valtrex.  Charcot's joint of foot, right Check does have peripheral neuropathy, noted on NCS, saw Foot/ankle surgery and was told there was not a good surgical answer for this. Continue Lyrica, I will bump him up to 75 mg 3 times daily. We already asked him to get a carbon fiber AFO on the right, he is currently ambulatory. He will again discuss with Dr. [name] if there is any nonsurgical options for him. Principal complaint is pain.  Lumbar stenosis with neurogenic claudication Known lumbar spinal stenosis, he had been using Flexeril at night, desires a refill, refill provided, risks explained.    ____________________________________________ [name]. [name], M.D., ABFM.,  CAQSM., AME. Primary Care and Sports Medicine [name]  Adjunct Professor of [name]  Risk manager

## 2022-05-16 NOTE — Assessment & Plan Note (Signed)
Levi [name] has had shingles in the past, he has started to develop a burning sensation right hemithorax radiating around to the front, no rash yet. As he may be in the prodromal phase we will go ahead and add Valtrex.

## 2022-05-21 ENCOUNTER — Ambulatory Visit (INDEPENDENT_AMBULATORY_CARE_PROVIDER_SITE_OTHER): Payer: Medicare Other

## 2022-05-21 DIAGNOSIS — R42 Dizziness and giddiness: Secondary | ICD-10-CM | POA: Diagnosis not present

## 2022-05-21 DIAGNOSIS — G319 Degenerative disease of nervous system, unspecified: Secondary | ICD-10-CM | POA: Diagnosis not present

## 2022-05-21 MED ORDER — GADOBUTROL 1 MMOL/ML IV SOLN
9.0000 mL | Freq: Once | INTRAVENOUS | Status: AC | PRN
  Administered 2022-05-21: 9 mL via INTRAVENOUS

## 2022-06-05 NOTE — Progress Notes (Unsigned)
No chief complaint on file.   HISTORY OF PRESENT ILLNESS:  06/05/22 ALL:  Levi [name] returns for follow up for neuropathy. He continues duloxetine 23m daily.   He had a flal in 12/2021 and referred to PT.   12/04/2021 ALL: Levi Listerreturns for follow up for neuropathy. He was last seen by Dr ARexene Alberts4/2023 and reported more falls and gait instability. He was tolerating duloxetine 618mdaily but felt that 3039morked better and dose was decreased. He feels that neuropathy seems stable. He has occasional burning. Neuropathy cream did not help much.   He reports that dizziness/lightheadedness has been a little worse over the past 6-8 weeks. Most of the time symptoms are consistent with standing from seated position. BP is normally "lower". No syncope. He "falls all the time". Mostly when not using He denies any significant injuries since last visit. He uses his four prong cane. Rarely uses walker. He wears compression stocking most of the time. He admits that he has not been drinking as much water recently. Lasix dose was increased to 58m45md of May.   He did undergo robotic nissen fundoplication 6/8/07/04/7684 was initially recovering well but developed more dysphagia about a week ago. He reports spitting up a little blood. Medrol taper did not help much. He is being scheduled for UGI this week. He last noticed blood tinged sputum about 2 days ago.   He continues to follow closely with cardiology for CHF. He lives with his wife. He is able to drive.   04/09/2021 ALL:  Levi Listerurns for follow up. We increased gabapentin to 300mg56m and started compounded neuropathy cream at last visit 11/2020. He stopped gabapentin as it was not effective. He reports that [facility] called him but never sent cream. He does not wish to resend orders. He reports neuropathy pain waxes and wanes. He continues to have regular falls. Fortunately, no injuries. He uses a cane most all the time except for when he is at  home. Falls have occurred when he is not using his cane. He has a walker but doesn't like to use it. PT has not been helpful. [facility] to return 05/09/2021 to discuss treatment for broken screw in his neck. He denies syncope since last being seen. Only one true syncopal event when seen by Dr AtharRexene Albertshas had 1-2 near syncopal events, last event was 2 weeks ago. Symptoms were very short lived, a minute or so. He reports blurry vision while driving. He felt that everything was slanted. He denies double vision. He states BP has been fluctuating from 80/60's to 150's/80's. He occasionally has dizziness with standing. He is on multiple cardiac medications. He is s/p failed watchman procedure. Last saw cardiology 10/20 who advised to continue Eliquis.   12/20/2020 ALL:  Levi [name] 77 y.110 male here today for follow up for bilateral leg pain. NCS/EMG testing shows chronic neuropathy. He was started on gabapentin 100mg 45my and advised to titrate to TID dosing. He has taken 300mg d58m at bedtime. He is uncertain if medicaiton as been helpful. He states that he was previously on gabapentin 300mg TI60md that seemed to work better. Burning pain continues to be fairly constant. Worse with activity and at night. [facility] nerve [facility] and ESI. He is beings scheduled for an MRI of cervical spine in the next couple of week. He has severe arthritis of  right foot. He was seen by podiatry and told it would take multiple surgeries over 5-6 years. He is on prednisone. He has had a total of 43 surgeries. He is currently being considered for insertion of Watchman.   HISTORY (copied from Dr [name] previous note)  Levi [name] is a 77 year old right-handed gentleman with an underlying  Complex medical history of asthma, A. fib, hyperlipidemia, reflux disease with hiatal hernia, diverticulitis, degenerative disc disease, COPD, allergic rhinitis,  history of agent orange exposure in the 70s, carpal tunnel syndrome, status post bilateral carpal tunnel release and ulnar release surgeries in January and February of this year, status post multiple surgeries including lumbar spine surgery, neck surgery, bilateral hip replacements, bilateral knee replacements, shoulder surgeries, who Presents for follow-up consultation of his neuropathy.  I saw him at the request of [name] on 10/21/2019, at which time the patient reported a longstanding history of numbness and tingling and pain in both lower extremities.  Symptoms were more on the right.   He had extensive blood work on 10/21/2019.  A1c was 5.5, ANA was negative, CRP was less than 1, rheumatoid factor was less than 10, CK was 59, heavy metals in the blood was negative, CMP showed glucose of 115, BUN 27, creatinine 0.88, AST 19, ALT 15, TSH was mildly elevated at 5.33, multiple myeloma panel showed decreased in immunoglobulins with a pattern suggestive of hypogammaglobulinemia, RPR nonreactive, vitamin B12 low at 224, thiamine 206.2.  He was advised to seek consultation with hematology.  He was furthermore advised to follow-up with his primary care for B12 deficiency and abnormal TSH.   He had an EMG and nerve conduction velocity test through our office on 11/17/2019 and I reviewed the results:   Conclusion: This is an abnormal study.  There is electrodiagnostic evidence of mild bilateral lumbosacral radiculopathy, mainly involving bilateral L4, L5, S1 myotomes, there is no evidence of active process.  In addition, there is evidence of mild length dependent axonal sensorimotor polyneuropathy.   We called him with his test results.  He saw hematology in July 2021, hypogammaglobulinemia was not considered to be significant at the time.  He did not have any history of recurrent infections.  He was not felt to need a bone marrow biopsy.   Today, 09/20/2020: He reports ongoing issues with leg pain, right more  than left.  It has not really progressed since last time but he has additional joint pains including both hips, sometimes he takes Flexeril at bedtime.  He does report that his balance is off and he has had falls, as recent as 2 days ago.  He fell in the yard.  He has a walker but typically only uses his four-point cane.  He tries to hydrate with water.  He reports a recent brief episode of A. fib, he reports that he can tell when he is in A. fib.  He is due for an eye exam.  He had a recent episode of dizziness and lightheadedness and had double vision which lasted about 10 minutes.  He has an eye examination yearly. He had an interim ER visit on 09/13/2020 for dizziness, lightheadedness.  I reviewed the emergency room records.  He had a head CT without contrast on 09/13/2020 and I reviewed the results: IMPRESSION: Mild atrophic changes without acute abnormality.  He was felt to have orthostasis.   He had recent right hand surgery on 08/07/2020 under Dr. [name].  He had right hand ligament reconstruction and tendon  interposition.  Pain is improved and mobility is better since surgery he reports.  He is not taking any oxycodone at this time.  Does take Flexeril as needed, typically only once at bedtime.   REVIEW OF SYSTEMS: Out of a complete 14 system review of symptoms, the patient complains only of the following symptoms, chronic pain, numbness/tingling, and all other reviewed systems are negative.   ALLERGIES: Allergies  Allergen Reactions   Morphine And Related Palpitations and Other (See Comments)    Made patient light-headed and sweaty; "my heart races" (04/28/2012), pt states too much morphine gave him this reaction   Levaquin [Levofloxacin] Itching     HOME MEDICATIONS: Outpatient Medications Prior to Visit  Medication Sig Dispense Refill   acetaminophen (TYLENOL) 650 MG CR tablet Take 1 tablet (650 mg total) by mouth every 8 (eight) hours as needed for pain. 90 tablet 3   albuterol  (VENTOLIN HFA) 108 (90 Base) MCG/ACT inhaler INHALE 2 PUFFS INTO THE LUNGS EVERY 4 HOURS AS NEEDED FOR WHEEZING OR SHORTNESS OF BREATH. 18 each 12   allopurinol (ZYLOPRIM) 100 MG tablet Take 100 mg by mouth daily.     amoxicillin-clavulanate (AUGMENTIN) 875-125 MG tablet Take 1 tablet by mouth 2 (two) times daily. 14 tablet 0   atorvastatin (LIPITOR) 20 MG tablet TAKE 1 TABLET BY MOUTH EVERY DAY 90 tablet 2   cyclobenzaprine (FLEXERIL) 10 MG tablet Take 1 tablet (10 mg total) by mouth at bedtime. 30 tablet 1   diclofenac Sodium (VOLTAREN) 1 % GEL Apply 2 g topically daily as needed (Pain).     diltiazem (CARDIZEM CD) 180 MG 24 hr capsule TAKE 1 CAPSULE BY MOUTH EVERY DAY 90 capsule 3   diltiazem (CARDIZEM) 30 MG tablet Take 30 mg by mouth as needed (heart rate over 120).     DULoxetine (CYMBALTA) 30 MG capsule Take 1 capsule (30 mg total) by mouth daily. 90 capsule 3   ELIQUIS 5 MG TABS tablet TAKE 1 TABLET BY MOUTH TWICE A DAY 60 tablet 6   furosemide (LASIX) 40 MG tablet Take 1 tablet (40 mg total) by mouth daily. As directed 90 tablet 3   ipratropium-albuterol (DUONEB) 0.5-2.5 (3) MG/3ML SOLN 1 NEBULE EVERY 6 HOURS AS NEEDED. **J45.3** 270 mL 4   isosorbide mononitrate (IMDUR) 30 MG 24 hr tablet TAKE 1 TABLET BY MOUTH EVERY DAY 30 tablet 6   Nebulizers (COMPRESSOR/NEBULIZER) MISC Use as directed 1 each 0   Olodaterol HCl (STRIVERDI RESPIMAT) 2.5 MCG/ACT AERS INHALE 2 PUFFS BY MOUTH INTO THE LUNGS DAILY 4 g 12   pantoprazole (PROTONIX) 40 MG tablet Take 1 tablet (40 mg total) by mouth daily. 90 tablet 0   potassium chloride SA (KLOR-CON M20) 20 MEQ tablet Take 1 tablet (20 mEq total) by mouth daily. 90 tablet 3   predniSONE (DELTASONE) 10 MG tablet TAKE 2 TABLETS (20 MG TOTAL) BY MOUTH DAILY. CONTINUOUS 180 tablet 1   pregabalin (LYRICA) 75 MG capsule Take 1 capsule (75 mg total) by mouth 3 (three) times daily. 90 capsule 3   valACYclovir (VALTREX) 1000 MG tablet Take 1 tablet (1,000 mg total)  by mouth 2 (two) times daily. 14 tablet 2   No facility-administered medications prior to visit.     PAST MEDICAL HISTORY: Past Medical History:  Diagnosis Date   Agent orange exposure 1970's   takes Imdur,Diltiazem, and Atacand daily   ALLERGIC RHINITIS    Allergy    seasonal allergies   Asthma  uses inhaler and nebulizers   Bruises easily    d/t prednisone daily   Cataract    CHF (congestive heart failure) ([place])    dx-on meds to treat   Chronic bronchitis ([place])    "used to get it q yr; last time ?2008" (04/28/2012)   COPD (chronic obstructive pulmonary disease) ([facility])    agent orange exposure   Degenerative disk disease    "everywhere" (04/28/2012)   Diverticulitis    Dysrhythmia    afib   Elevated uric acid in blood    takes Allopurinol daily   Emphysema of lung ([place])    Enlarged prostate    but not on any meds   GERD (gastroesophageal reflux disease)    takes Protonix daily   H/O hiatal hernia    Headache    History of pneumonia    a. 2010   Hyperlipidemia    takes Lipitor daily; pt. states he takes a preventive   Hypothyroidism    Joint pain    Joint swelling    Neuromuscular disorder ([place])    bilat legs, and bilat arms   PAF (paroxysmal atrial fibrillation) ([place])    a. Dx 04/2012, CHA2DS2VASc = 1 (age);  b. 04/2012 Echo: EF 40-50%, mild MR.   Personal history of colonic adenomas 02/09/2013   Pneumonia    Shortness of breath dyspnea    Thyroid disease    no taking meds-caused dizziness     PAST SURGICAL HISTORY: Past Surgical History:  Procedure Laterality Date   76 HOUR [place] STUDY N/A 05/30/2021   Procedure: 24 HOUR [place] STUDY;  Surgeon: Lavena Bullion, DO;  Location: [facility];  Service: Gastroenterology;  Laterality: N/A;   ANTERIOR CERVICAL DECOMP/DISCECTOMY FUSION N/A 07/21/2012   Procedure: ANTERIOR CERVICAL DECOMPRESSION/DISCECTOMY FUSION 2 LEVELS;  Surgeon: [name], MD;  Location: [facility];  Service: Neurosurgery;  Laterality:  N/A;  C4-5 C5-6 Anterior cervical decompression/diskectomy/fusion   ANTERIOR LAT LUMBAR FUSION N/A 04/07/2017   Procedure: Thoracic twelve-Lumbar one Anterolateral decompression/fusion;  Surgeon: [name], MD;  Location: [facility];  Service: Neurosurgery;  Laterality: N/A;   BACK SURGERY     x 3   CARDIAC CATHETERIZATION  11/2008   [name] - 20% calcified non flow limiting left main, 50% EF apical hypokinesis   CERVICAL DISC ARTHROPLASTY N/A 04/07/2017   Procedure: Cervical six-seven Disc arthroplasty;  Surgeon: [name], MD;  Location: [facility];  Service: Neurosurgery;  Laterality: N/A;   CHEST TUBE INSERTION Left 04/11/2017   Procedure: CHEST TUBE INSERTION;  Surgeon: [name], MD;  Location: [facility];  Service: Thoracic;  Laterality: Left;   CHOLECYSTECTOMY     COLONOSCOPY  2016   CG-MAC-miralax(good)servere TICS/TA x 2   ESOPHAGEAL MANOMETRY N/A 05/30/2021   Procedure: ESOPHAGEAL MANOMETRY (EM);  Surgeon: Lavena Bullion, DO;  [facility];  Service: Gastroenterology;  Laterality: N/A;  ph impedence   ESOPHAGOGASTRODUODENOSCOPY     EYE SURGERY Bilateral 2011   steroidal encapsulation" (04/28/2012)   FOOT SURGERY Right    x 2   KNEE ARTHROSCOPY Bilateral    LATERAL / POSTERIOR COMBINED FUSION LUMBAR SPINE  2012   LEFT ATRIAL APPENDAGE OCCLUSION N/A 02/22/2021   Procedure: LEFT ATRIAL APPENDAGE OCCLUSION;  Surgeon: Vickie Epley, MD;  Location: [facility];  Service: Cardiovascular;  Laterality: N/A;   POLYPECTOMY  2016   severe TICS/TA x 2   POSTERIOR CERVICAL LAMINECTOMY WITH MET- RX Right 06/11/2021   Procedure: Right  Cervical six-seven  Laminectomy and foraminotomy with metrex;  Surgeon: [name], MD;  Location: [facility];  Service: Neurosurgery;  Laterality: Right;   REVERSE SHOULDER ARTHROPLASTY  04/28/2012   Procedure: REVERSE SHOULDER ARTHROPLASTY;  Surgeon: [name], MD;  Location: [facility];  Service: Orthopedics;  Laterality:  Left;  Left shouder reverse total shoulder arthroplasty   SHOULDER SURGERY     TEE WITHOUT CARDIOVERSION N/A 02/22/2021   Procedure: TRANSESOPHAGEAL ECHOCARDIOGRAM (TEE);  Surgeon: Vickie Epley, MD;  Location: [facility];  Service: Cardiovascular;  Laterality: N/A;   TENDON REPAIR Right 08/07/2020   Procedure: RIGHT HAND LIGAMENT RECONSTRUCTION AND TENDON INTERPOSITION;  Surgeon: [name], MD;  Location: [facility];  Service: Orthopedics;  Laterality: Right;   [place]   TOTAL HIP ARTHROPLASTY Left 2011   "left" (04/28/2012)   TOTAL HIP ARTHROPLASTY Right 07/31/2015   Procedure: TOTAL HIP ARTHROPLASTY ANTERIOR APPROACH;  Surgeon: [name], MD;  Location: [facility];  Service: Orthopedics;  Laterality: Right;   TOTAL KNEE ARTHROPLASTY  01/10/2012   Procedure: TOTAL KNEE ARTHROPLASTY;  Surgeon: [name], MD;  Location: [facility];  Service: Orthopedics;;  left total knee arthroplasty   TOTAL KNEE ARTHROPLASTY Right 06/17/2014   Procedure: TOTAL KNEE ARTHROPLASTY;  Surgeon: [name], MD;  Location: [facility];  Service: Orthopedics;  Laterality: Right;   toupet fundolplication  75/88/3254     FAMILY HISTORY: Family History  Problem Relation Age of Onset   Diabetes Mother    Heart disease Father        Died 74, MI   Prostate cancer Brother    Colon polyps Brother    Colon polyps Brother    Colon cancer Brother        Dx age 74   Colon cancer Other    Esophageal cancer Neg Hx    Rectal cancer Neg Hx    Stomach cancer Neg Hx    Pancreatic cancer Neg Hx    Kidney disease Neg Hx    Liver disease Neg Hx    Neuropathy Neg Hx      SOCIAL HISTORY: Social History   Socioeconomic History   Marital status: Married    Spouse name: Not on file   Number of children: 1   Years of education: Not on file   Highest education level: Not on file  Occupational History   Occupation: Retired    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, Radiographer, therapeutic   Social Determinants of Health   Financial Resource Strain: Low Risk  (02/18/2022)   Overall Financial Resource Strain (CARDIA)    Difficulty of Paying Living Expenses: Not hard at all  Food Insecurity: No Food Insecurity (02/18/2022)   Hunger Vital Sign    Worried About Running Out of Food in the Last Year: Never true    Ran Out of Food  in the Last Year: Never true  Transportation Needs: No Transportation Needs (02/18/2022)   PRAPARE - Hydrologist (Medical): No    Lack of Transportation (Non-Medical): No  Physical Activity: Not on file  Stress: Not on file  Social Connections: Not on file  Intimate Partner Violence: Not on file     PHYSICAL EXAM  There were no vitals filed for this visit.    There is no height or weight on file to calculate BMI.   Generalized: Well developed, in no acute distress  Cardiology: normal rate and rhythm, no murmur auscultated  Respiratory: clear to auscultation bilaterally    Neurological examination  Mentation: Alert oriented to time, place, history taking. Follows all commands speech and language fluent Cranial nerve II-XII: Pupils were equal round reactive to light. Extraocular movements were full, visual field were full on confrontational test. Facial sensation and strength were normal. Uvula tongue midline. Head turning and shoulder shrug  were normal and symmetric. Motor: The motor  testing reveals 4-4+ over 5 strength of all 4 extremities with exception of R lower hip flexion and dorsiflexion 4/5.  Sensory: Sensory testing is decreased to soft and pinprick testing of bilateral lower extremities Coordination: Cerebellar testing reveals good finger-nose-finger, unable to perform heel to shin bilaterally Gait and station: He pushes with some difficulty to standing position. Gait is slow and cautious. Stable with cane. Tandem not attempted.  Reflexes: Deep tendon reflexes are symmetric and normal bilaterally.    DIAGNOSTIC DATA (LABS, IMAGING, TESTING) - I reviewed patient records, labs, notes, testing and imaging myself where available.  Lab Results  Component Value Date   WBC 12.3 (H) 05/02/2022   HGB 13.8 05/02/2022   HCT 40.4 05/02/2022   MCV 92.0 05/02/2022   PLT 224 05/02/2022      Component Value Date/Time   NA 141 05/02/2022 0916   NA 143 08/15/2021 1349   K 5.3 05/02/2022 0916   CL 106 05/02/2022 0916   CO2 25 05/02/2022 0916   GLUCOSE 109 (H) 05/02/2022 0916   BUN 23 05/02/2022 0916   BUN 21 08/15/2021 1349   CREATININE 0.94 05/02/2022 0916   CALCIUM 9.6 05/02/2022 0916   PROT 6.4 05/02/2022 0916   PROT 5.9 (L) 10/21/2019 0941   ALBUMIN 4.0 09/25/2021 1825   ALBUMIN 4.1 10/21/2019 0941   AST 20 05/02/2022 0916   AST 19 02/28/2020 1440   ALT 19 05/02/2022 0916   ALT 15 02/28/2020 1440   ALKPHOS 88 09/25/2021 1825   BILITOT 0.6 05/02/2022 0916   BILITOT 0.6 02/28/2020 1440   GFRNONAA >60 09/25/2021 1825   GFRNONAA >60 02/28/2020 1440   GFRAA >60 02/28/2020 1440   Lab Results  Component Value Date   CHOL 188 09/28/2019   HDL 109 09/28/2019   LDLCALC 66 09/28/2019   TRIG 69 09/28/2019   CHOLHDL 1.7 09/28/2019   Lab Results  Component Value Date   HGBA1C 5.5 10/21/2019   Lab Results  Component Value Date   VITAMINB12 224 (L) 10/21/2019   Lab Results  Component Value Date   TSH 5.330 (H) 10/21/2019        No data to display                No data to display           ASSESSMENT AND PLAN  77 y.o. year old male  has a past medical history of Agent orange exposure (1970's), ALLERGIC RHINITIS,  Allergy, Asthma, Bruises easily, Cataract, CHF (congestive heart failure) ([facility]), Chronic bronchitis ([place]), COPD (chronic obstructive pulmonary disease) ([place]), Degenerative disk disease, Diverticulitis, Dysrhythmia, Elevated uric acid in blood, Emphysema of lung ([place]), Enlarged prostate, GERD (gastroesophageal reflux disease), H/O hiatal hernia, Headache, History of pneumonia, Hyperlipidemia, Hypothyroidism, Joint pain, Joint swelling, Neuromuscular disorder ([place]), PAF (paroxysmal atrial fibrillation) ([place]), Personal history of colonic adenomas (02/09/2013), Pneumonia, Shortness of breath dyspnea, and Thyroid disease. here with    No diagnosis found.  Levi [name] feels that neuropathy is stable. We will continue duloxetine 23m daily. He continues to have some lightheaded and instability. We have discussed multiple contributing factors. I have encouraged him to wear compression stockings, make position changes slowly and focus on adequate hydration. Fall precautions reviewed. He was encouraged to use cane at all times. Consider walker if he needs more stability. He declines PT referral. He will continue close follow up with PCP, cardiology and neurosurgery. I will have him return for follow up with me in 6 months. He verbalizes understanding and agreement with this plan.    No orders of the defined types were placed in this encounter.     No orders of the defined types were placed in this encounter.     ADebbora Presto MSN, FNP-C 06/05/2022, 4:55 PM  [facility] [address] SRenner CornerGPlessis Monroe [phone]

## 2022-06-05 NOTE — Patient Instructions (Signed)
Below is our plan:  We will continue duloxetine 30mg  daily. PLEASE USE CANE or WALKER AT ALL TIMES. We have called Dr T and you have an appt tomorrow 06/07/2021 at 8:45. Please arrive by 8:30 to get checked in. Let him look at that foot. If you experience any symptoms, chest pain, shortness of breath, etc, call 911.   Please make sure you are staying well hydrated. I recommend 50-60 ounces daily. Well balanced diet and regular exercise encouraged. Consistent sleep schedule with 6-8 hours recommended.   Please continue follow up with care team as directed.   Follow up with me in 6 months   You may receive a survey regarding today's visit. I encourage you to leave honest feed back as I do use this information to improve patient care. Thank you for seeing me today!

## 2022-06-06 ENCOUNTER — Ambulatory Visit (INDEPENDENT_AMBULATORY_CARE_PROVIDER_SITE_OTHER): Payer: Medicare Other | Admitting: Family Medicine

## 2022-06-06 ENCOUNTER — Encounter: Payer: Self-pay | Admitting: Family Medicine

## 2022-06-06 VITALS — BP 113/63 | HR 76 | Ht 74.0 in | Wt 196.5 lb

## 2022-06-06 DIAGNOSIS — R296 Repeated falls: Secondary | ICD-10-CM | POA: Diagnosis not present

## 2022-06-06 DIAGNOSIS — G629 Polyneuropathy, unspecified: Secondary | ICD-10-CM | POA: Diagnosis not present

## 2022-06-06 DIAGNOSIS — R2689 Other abnormalities of gait and mobility: Secondary | ICD-10-CM | POA: Diagnosis not present

## 2022-06-06 DIAGNOSIS — R0989 Other specified symptoms and signs involving the circulatory and respiratory systems: Secondary | ICD-10-CM | POA: Diagnosis not present

## 2022-06-07 ENCOUNTER — Ambulatory Visit (INDEPENDENT_AMBULATORY_CARE_PROVIDER_SITE_OTHER): Payer: Medicare Other

## 2022-06-07 ENCOUNTER — Encounter: Payer: Self-pay | Admitting: Sports Medicine

## 2022-06-07 ENCOUNTER — Ambulatory Visit (INDEPENDENT_AMBULATORY_CARE_PROVIDER_SITE_OTHER): Payer: Medicare Other | Admitting: Sports Medicine

## 2022-06-07 VITALS — BP 129/81 | HR 98

## 2022-06-07 DIAGNOSIS — M14671 Charcot's joint, right ankle and foot: Secondary | ICD-10-CM

## 2022-06-07 DIAGNOSIS — Z0000 Encounter for general adult medical examination without abnormal findings: Secondary | ICD-10-CM

## 2022-06-07 DIAGNOSIS — S9032XA Contusion of left foot, initial encounter: Secondary | ICD-10-CM | POA: Diagnosis not present

## 2022-06-07 DIAGNOSIS — M7732 Calcaneal spur, left foot: Secondary | ICD-10-CM | POA: Diagnosis not present

## 2022-06-07 DIAGNOSIS — M19072 Primary osteoarthritis, left ankle and foot: Secondary | ICD-10-CM | POA: Diagnosis not present

## 2022-06-07 DIAGNOSIS — I739 Peripheral vascular disease, unspecified: Secondary | ICD-10-CM

## 2022-06-07 DIAGNOSIS — M85872 Other specified disorders of bone density and structure, left ankle and foot: Secondary | ICD-10-CM | POA: Diagnosis not present

## 2022-06-07 DIAGNOSIS — B029 Zoster without complications: Secondary | ICD-10-CM

## 2022-06-07 DIAGNOSIS — M79672 Pain in left foot: Secondary | ICD-10-CM

## 2022-06-07 MED ORDER — TETANUS-DIPHTH-ACELL PERTUSSIS 5-2.5-18.5 LF-MCG/0.5 IM SUSY: 0.5000 mL | PREFILLED_SYRINGE | Freq: Once | INTRAMUSCULAR | 0 refills | Status: AC

## 2022-06-07 MED ORDER — SHINGRIX 50 MCG/0.5ML IM SUSR: 0.5000 mL | Freq: Once | INTRAMUSCULAR | 0 refills | Status: AC

## 2022-06-07 NOTE — Assessment & Plan Note (Signed)
Known peripheral neuropathy noted on nerve conduction study, saw foot ankle surgery and told there was no good surgical answer for this, we bumped him up to Lyrica 75 3 times daily, he has not yet picked this up, he will and he will come back to see [place] in about 4 weeks. Principal complaint was pain.

## 2022-06-07 NOTE — Assessment & Plan Note (Addendum)
Levi [name] was recently seen at his neurology provider, he was noted to have poor pulses left lower extremity as well as bruising. He does have significant neuropathy and anesthesia of the left lower extremity. It does look like he probably had some trauma and subsequently has some bruising so we will get some x-rays, bruising is centered around the second through fourth MTPs. I am able to palpate his dorsalis pedis pulse on the left but not his posterior tibial, so we will proceed with ABI/TBI of both lower extremities. Return to see me after this is done to go over results.  Update: Toe brachial index on the right was borderline, other locations showed mostly noncompressible arteries. Considering his mottling would like a consultation with vascular surgery.

## 2022-06-07 NOTE — Assessment & Plan Note (Signed)
Levi [name] had shingles in the past, at the last visit in February he developed a burning sensation right hemithorax radiating around to the front, no rash yet. I suspect he was in the prodromal phase of shingles so we added Valtrex and symptoms disappeared quickly. We will get him vaccinated.

## 2022-06-07 NOTE — Progress Notes (Addendum)
    Procedures performed today:    None.  Independent interpretation of notes and tests performed by another provider:   None.  Brief History, Exam, Impression, and Recommendations:    Charcot's joint of foot, right Known peripheral neuropathy noted on nerve conduction study, saw foot ankle surgery and told there was no good surgical answer for this, we bumped him up to Lyrica 75 3 times daily, he has not yet picked this up, he will and he will come back to see [place] in about 4 weeks. Principal complaint was pain.  Peripheral arterial disease (HCC) Levi [name] was recently seen at his neurology provider, he was noted to have poor pulses left lower extremity as well as bruising. He does have significant neuropathy and anesthesia of the left lower extremity. It does look like he probably had some trauma and subsequently has some bruising so we will get some x-rays, bruising is centered around the second through fourth MTPs. I am able to palpate his dorsalis pedis pulse on the left but not his posterior tibial, so we will proceed with ABI/TBI of both lower extremities. Return to see me after this is done to go over results.  Update: Toe brachial index on the right was borderline, other locations showed mostly noncompressible arteries. Considering his mottling would like a consultation with vascular surgery.  Annual physical exam Had some trouble getting Shingrix and Tdap covered by his insurance, he has a new insurance plan so we will resend this to his pharmacy.  Shingles Levi [name] had shingles in the past, at the last visit in February he developed a burning sensation right hemithorax radiating around to the front, no rash yet. I suspect he was in the prodromal phase of shingles so we added Valtrex and symptoms disappeared quickly. We will get him vaccinated.    ____________________________________________ [name]. [name], M.D., ABFM., CAQSM., AME. Primary Care and Sports Medicine [facility]  Adjunct Professor of [place] [employer] [facility] of [employer]  Risk manager

## 2022-06-07 NOTE — Assessment & Plan Note (Signed)
Had some trouble getting Shingrix and Tdap covered by his insurance, he has a new insurance plan so we will resend this to his pharmacy.

## 2022-06-19 DIAGNOSIS — H527 Unspecified disorder of refraction: Secondary | ICD-10-CM | POA: Diagnosis not present

## 2022-06-19 DIAGNOSIS — Z961 Presence of intraocular lens: Secondary | ICD-10-CM | POA: Diagnosis not present

## 2022-06-19 DIAGNOSIS — H353131 Nonexudative age-related macular degeneration, bilateral, early dry stage: Secondary | ICD-10-CM | POA: Diagnosis not present

## 2022-06-19 DIAGNOSIS — H524 Presbyopia: Secondary | ICD-10-CM | POA: Diagnosis not present

## 2022-06-19 DIAGNOSIS — H53001 Unspecified amblyopia, right eye: Secondary | ICD-10-CM | POA: Diagnosis not present

## 2022-06-19 DIAGNOSIS — H26493 Other secondary cataract, bilateral: Secondary | ICD-10-CM | POA: Diagnosis not present

## 2022-06-19 DIAGNOSIS — H43813 Vitreous degeneration, bilateral: Secondary | ICD-10-CM | POA: Diagnosis not present

## 2022-06-19 DIAGNOSIS — H31091 Other chorioretinal scars, right eye: Secondary | ICD-10-CM | POA: Diagnosis not present

## 2022-06-19 DIAGNOSIS — H02403 Unspecified ptosis of bilateral eyelids: Secondary | ICD-10-CM | POA: Diagnosis not present

## 2022-06-19 DIAGNOSIS — H35432 Paving stone degeneration of retina, left eye: Secondary | ICD-10-CM | POA: Diagnosis not present

## 2022-06-20 ENCOUNTER — Ambulatory Visit (HOSPITAL_COMMUNITY)
Admission: RE | Admit: 2022-06-20 | Discharge: 2022-06-20 | Disposition: A | Discharge status: Home / Self Care | Payer: Medicare Other | Source: Ambulatory Visit | Attending: Sports Medicine | Admitting: Sports Medicine

## 2022-06-20 DIAGNOSIS — I739 Peripheral vascular disease, unspecified: Secondary | ICD-10-CM | POA: Diagnosis not present

## 2022-06-20 LAB — VAS US ABI WITH/WO TBI

## 2022-06-20 NOTE — Addendum Note (Signed)
Addended by: Silverio Decamp on: 06/20/2022 10:50 AM   Modules accepted: Orders

## 2022-06-24 ENCOUNTER — Encounter: Payer: Self-pay | Admitting: Surgery

## 2022-06-24 ENCOUNTER — Ambulatory Visit (INDEPENDENT_AMBULATORY_CARE_PROVIDER_SITE_OTHER): Payer: Medicare Other | Admitting: Surgery

## 2022-06-24 VITALS — BP 132/77 | HR 63 | Temp 98.2°F | Resp 20 | Ht 74.0 in | Wt 200.0 lb

## 2022-06-24 DIAGNOSIS — I739 Peripheral vascular disease, unspecified: Secondary | ICD-10-CM | POA: Diagnosis not present

## 2022-06-24 NOTE — Progress Notes (Signed)
Vascular and Vein Specialist of [place]  Patient name: Levi [name] MRN: [id number] DOB: [birth year] Sex: male   REQUESTING PROVIDER:    Dr. [name]   REASON FOR CONSULT:    PAD  HISTORY OF PRESENT ILLNESS:   Levi [name] is a 77 y.o. male, who has been having some bilateral foot pain.  Pulses were unable to be palpated and so he was sent for vascular lab study which was somewhat abnormal and so vascular consultation was recommended.  The patient does have congestive heart failure.  He has been started on diuretics to help with his chronic leg swelling.  He does have a history of a right leg ulcer that required treatment at the wound center and has now healed.  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  He is on prednisone daily.  He takes a statin for hypercholesterolemia.  He does have a neuromuscular disorder.  He is on Eliquis for atrial fibrillation.  He is a non-smoker.  PAST MEDICAL HISTORY    Past Medical History:  Diagnosis Date   Agent orange exposure 1970's   takes Imdur,Diltiazem, and Atacand daily   ALLERGIC RHINITIS    Allergy    seasonal allergies   Asthma    uses inhaler and nebulizers   Bruises easily    d/t prednisone daily   Cataract    CHF (congestive heart failure) ([place])    dx-on meds to treat   Chronic bronchitis ([place])    "used to get it q yr; last time ?2008" (04/28/2012)   COPD (chronic obstructive pulmonary disease) (HCC)    agent orange exposure   Degenerative disk disease    "everywhere" (04/28/2012)   Diverticulitis    Dysrhythmia    afib   Elevated uric acid in blood    takes Allopurinol daily   Emphysema of lung ([place])    Enlarged prostate    but not on any meds   GERD (gastroesophageal reflux disease)    takes Protonix daily   H/O hiatal hernia    Headache    History of pneumonia    a. 2010   Hyperlipidemia    takes Lipitor daily; pt. states he takes a preventive    Hypothyroidism    Joint pain    Joint swelling    Neuromuscular disorder ([place])    bilat legs, and bilat arms   PAF (paroxysmal atrial fibrillation) ([place])    a. Dx 04/2012, CHA2DS2VASc = 1 (age);  b. 04/2012 Echo: EF 40-50%, mild MR.   Personal history of colonic adenomas 02/09/2013   Pneumonia    Shortness of breath dyspnea    Thyroid disease    no taking meds-caused dizziness     FAMILY HISTORY   Family History  Problem Relation Age of Onset   Diabetes Mother    Heart disease Father        Died 74, MI   Prostate cancer Brother    Colon polyps Brother    Colon polyps Brother    Colon cancer Brother        Dx age 98   Colon cancer Other    Esophageal cancer Neg Hx    Rectal cancer Neg Hx    Stomach cancer Neg Hx    Pancreatic cancer Neg Hx    Kidney disease Neg Hx    Liver disease Neg Hx    Neuropathy Neg Hx     SOCIAL HISTORY:   Social History  Socioeconomic History   Marital status: Married    Spouse name: Not on file   Number of children: 1   Years of education: Not on file   Highest education level: Not on file  Occupational History   Occupation: Retired    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, Radiographer, therapeutic   Social Determinants of Health   Financial Resource Strain: Low Risk  (02/18/2022)   Overall Financial Resource Strain (CARDIA)    Difficulty of Paying Living Expenses: Not hard at all  Food Insecurity: No Food Insecurity (02/18/2022)    Hunger Vital Sign    Worried About Running Out of Food in the Last Year: Never true    [place] in the Last Year: Never true  Transportation Needs: No Transportation Needs (02/18/2022)   PRAPARE - Hydrologist (Medical): No    Lack of Transportation (Non-Medical): No  Physical Activity: Not on file  Stress: Not on file  Social Connections: Not on file  Intimate Partner Violence: Not on file    ALLERGIES:    Allergies  Allergen Reactions   Morphine And Related Palpitations and Other (See Comments)    Made patient light-headed and sweaty; "my heart races" (04/28/2012), pt states too much morphine gave him this reaction   Levaquin [Levofloxacin] Itching    CURRENT MEDICATIONS:    Current Outpatient Medications  Medication Sig Dispense Refill   acetaminophen (TYLENOL) 650 MG CR tablet Take 1 tablet (650 mg total) by mouth every 8 (eight) hours as needed for pain. 90 tablet 3   albuterol (VENTOLIN HFA) 108 (90 Base) MCG/ACT inhaler INHALE 2 PUFFS INTO THE LUNGS EVERY 4 HOURS AS NEEDED FOR WHEEZING OR SHORTNESS OF BREATH. 18 each 12   allopurinol (ZYLOPRIM) 100 MG tablet Take 100 mg by mouth daily.     atorvastatin (LIPITOR) 20 MG tablet TAKE 1 TABLET BY MOUTH EVERY DAY 90 tablet 2   cyclobenzaprine (FLEXERIL) 10 MG tablet Take 1 tablet (10 mg total) by mouth at bedtime. 30 tablet 1   diclofenac Sodium (VOLTAREN) 1 % GEL Apply 2 g topically daily as needed (Pain).     diltiazem (CARDIZEM CD) 180 MG 24 hr capsule TAKE 1 CAPSULE BY MOUTH EVERY DAY 90 capsule 3   diltiazem (CARDIZEM) 30 MG tablet Take 30 mg by mouth as needed (heart rate over 120).     DULoxetine (CYMBALTA) 30 MG capsule Take 1 capsule (30 mg total) by mouth daily. 90 capsule 3   ELIQUIS 5 MG TABS tablet TAKE 1 TABLET BY MOUTH TWICE A DAY 60 tablet 6   furosemide (LASIX) 40 MG tablet Take 1 tablet (40 mg total) by mouth daily. As directed 90 tablet 3   ipratropium-albuterol (DUONEB)  0.5-2.5 (3) MG/3ML SOLN 1 NEBULE EVERY 6 HOURS AS NEEDED. **J45.3** 270 mL 4   isosorbide mononitrate (IMDUR) 30 MG 24 hr  tablet TAKE 1 TABLET BY MOUTH EVERY DAY 30 tablet 6   Nebulizers (COMPRESSOR/NEBULIZER) MISC Use as directed 1 each 0   Olodaterol HCl (STRIVERDI RESPIMAT) 2.5 MCG/ACT AERS INHALE 2 PUFFS BY MOUTH INTO THE LUNGS DAILY 4 g 12   pantoprazole (PROTONIX) 40 MG tablet Take 1 tablet (40 mg total) by mouth daily. 90 tablet 0   potassium chloride SA (KLOR-CON M20) 20 MEQ tablet Take 1 tablet (20 mEq total) by mouth daily. 90 tablet 3   predniSONE (DELTASONE) 10 MG tablet TAKE 2 TABLETS (20 MG TOTAL) BY MOUTH DAILY. CONTINUOUS 180 tablet 1   pregabalin (LYRICA) 75 MG capsule Take 1 capsule (75 mg total) by mouth 3 (three) times daily. 90 capsule 3   valACYclovir (VALTREX) 1000 MG tablet Take 1 tablet (1,000 mg total) by mouth 2 (two) times daily. 14 tablet 2   No current facility-administered medications for this visit.    REVIEW OF SYSTEMS:   '[X]'$  denotes positive finding, '[ ]'$  denotes negative finding Cardiac  Comments:  Chest pain or chest pressure:    Shortness of breath upon exertion:    Short of breath when lying flat:    Irregular heart rhythm:        Vascular    Pain in calf, thigh, or hip brought on by ambulation:    Pain in feet at night that wakes you up from your sleep:     Blood clot in your veins:    Leg swelling:  x       Pulmonary    Oxygen at home:    Productive cough:     Wheezing:         Neurologic    Sudden weakness in arms or legs:     Sudden numbness in arms or legs:     Sudden onset of difficulty speaking or slurred speech:    Temporary loss of vision in one eye:     Problems with dizziness:         Gastrointestinal    Blood in stool:      Vomited blood:         Genitourinary    Burning when urinating:     Blood in urine:        Psychiatric    Major depression:         Hematologic    Bleeding problems:    Problems with blood  clotting too easily:        Skin    Rashes or ulcers:        Constitutional    Fever or chills:     PHYSICAL EXAM:   Vitals:   06/24/22 0818  BP: 132/77  Pulse: 63  Resp: 20  Temp: 98.2 F (36.8 C)  SpO2: 96%  Weight: 200 lb (90.7 kg)  Height: '6\' 2"'$  (1.88 m)    GENERAL: The patient is a well-nourished male, in no acute distress. The vital signs are documented above. CARDIAC: There is a regular rate and rhythm.  VASCULAR: Palpable dorsalis pedis and posterior tibial pulses bilaterally.  1-2+ pitting edema bilaterally with brawny discoloration of the skin PULMONARY: Nonlabored respirations MUSCULOSKELETAL: Charcot foot, right. NEUROLOGIC: No focal weakness or paresthesias are detected. SKIN: There are no ulcers or rashes noted. PSYCHIATRIC: The patient has a normal affect.  STUDIES:   I have reviewed the following: ABI/TBIToday's ABIToday's TBIPrevious ABIPrevious TBI  +-------+-----------+-----------+------------+------------+  Right [place]         0.70                                 +-------+-----------+-----------+------------+------------+  Left  [place]         1.00                                 +-------+-----------+-----------+------------+------------+  Right toe pressure:  90 Left toe pressure:  129 Waveforms are triphasic  ASSESSMENT and PLAN   Foot pain: I reassured the patient that he has normal blood flow.  His toe pressures are normal.  Although his vessels are noncompressible and therefore ABIs could not be calculated, he has triphasic waveforms as well as palpable pulses.  No further vascular workup is necessary.  I reassured the patient that he does not have significant stenosis in his lower extremity vasculature.  I have encouraged him to wear compression socks for his lower extremity edema.  He will follow-up with me on an as-needed basis.   Leia Alf, MD, FACS [facility] [phone] Pager  [phone]

## 2022-06-27 ENCOUNTER — Ambulatory Visit: Payer: Medicare Other | Admitting: Sports Medicine

## 2022-06-28 ENCOUNTER — Other Ambulatory Visit: Payer: Self-pay | Admitting: Sports Medicine

## 2022-06-28 DIAGNOSIS — M48062 Spinal stenosis, lumbar region with neurogenic claudication: Secondary | ICD-10-CM

## 2022-07-05 ENCOUNTER — Ambulatory Visit (INDEPENDENT_AMBULATORY_CARE_PROVIDER_SITE_OTHER): Payer: Medicare Other

## 2022-07-05 ENCOUNTER — Ambulatory Visit (INDEPENDENT_AMBULATORY_CARE_PROVIDER_SITE_OTHER): Payer: Medicare Other | Admitting: Sports Medicine

## 2022-07-05 DIAGNOSIS — M7021 Olecranon bursitis, right elbow: Secondary | ICD-10-CM | POA: Diagnosis not present

## 2022-07-05 DIAGNOSIS — R739 Hyperglycemia, unspecified: Secondary | ICD-10-CM | POA: Diagnosis not present

## 2022-07-05 DIAGNOSIS — I10 Essential (primary) hypertension: Secondary | ICD-10-CM | POA: Diagnosis not present

## 2022-07-05 DIAGNOSIS — M14671 Charcot's joint, right ankle and foot: Secondary | ICD-10-CM | POA: Diagnosis not present

## 2022-07-05 DIAGNOSIS — G6289 Other specified polyneuropathies: Secondary | ICD-10-CM | POA: Diagnosis not present

## 2022-07-05 DIAGNOSIS — M19011 Primary osteoarthritis, right shoulder: Secondary | ICD-10-CM | POA: Diagnosis not present

## 2022-07-05 DIAGNOSIS — E7800 Pure hypercholesterolemia, unspecified: Secondary | ICD-10-CM | POA: Diagnosis not present

## 2022-07-05 DIAGNOSIS — M25511 Pain in right shoulder: Secondary | ICD-10-CM

## 2022-07-05 DIAGNOSIS — E538 Deficiency of other specified B group vitamins: Secondary | ICD-10-CM

## 2022-07-05 MED ORDER — TRIAMCINOLONE ACETONIDE 40 MG/ML IJ SUSP
40.0000 mg | Freq: Once | INTRAMUSCULAR | Status: AC
  Administered 2022-07-05: 40 mg via INTRAMUSCULAR

## 2022-07-05 MED ORDER — HYDROCODONE-ACETAMINOPHEN 5-325 MG PO TABS: 1.0000 | ORAL_TABLET | Freq: Three times a day (TID) | ORAL | 0 refills | Status: DC | PRN

## 2022-07-05 NOTE — Progress Notes (Signed)
    Procedures performed today:    Procedure: Real-time Ultrasound Guided injection of the right glenohumeral joint Device: Samsung HS60  Verbal informed consent obtained.  Time-out conducted.  Noted no overlying erythema, induration, or other signs of local infection.  Skin prepped in a sterile fashion.  Local anesthesia: Topical Ethyl chloride.  With sterile technique and under real time ultrasound guidance: Arthritic joint noted, 1 cc Kenalog 40, 2 cc lidocaine, 2 cc bupivacaine injected easily Completed without difficulty  Advised to call if fevers/chills, erythema, induration, drainage, or persistent bleeding.  Images permanently stored and available for review in PACS.  Impression: Technically successful ultrasound guided injection.  Independent interpretation of notes and tests performed by another provider:   None.  Brief History, Exam, Impression, and Recommendations:    Primary osteoarthritis of right shoulder, status post left shoulder arthroplasty History of left shoulder arthroplasty with Dr. Tamera Punt, increasing pain right shoulder, today we did a glenohumeral joint injection, he will do some x-rays and return to see me in 6 weeks.  Charcot's joint of foot, right Known peripheral neuropathy, this was noted on nerve conduction study, he did see orthopedic foot and ankle surgery and was told there was no good surgical answer for his problem, we bumped Lyrica up to 75 mg 3 times daily, he notes may be marginal improvement. As his principal complaint is pain we will add some hydrocodone for use as needed and I would like him to get a custom low-profile carbon fiber AFO. Return to see me after he has used the AFO for over a month. We will get him in to restore prosthetics and orthotics in [place].    ____________________________________________ [name]. [name], M.D., ABFM., CAQSM., AME. Primary Care and Sports Medicine [facility]  [facility]  Adjunct Professor of [place] of [facility] of Medicine  Risk manager

## 2022-07-05 NOTE — Assessment & Plan Note (Signed)
History of left shoulder arthroplasty with Dr. Tamera Punt, increasing pain right shoulder, today we did a glenohumeral joint injection, he will do some x-rays and return to see me in 6 weeks.

## 2022-07-05 NOTE — Assessment & Plan Note (Signed)
Known peripheral neuropathy, this was noted on nerve conduction study, he did see orthopedic foot and ankle surgery and was told there was no good surgical answer for his problem, we bumped Lyrica up to 75 mg 3 times daily, he notes may be marginal improvement. As his principal complaint is pain we will add some hydrocodone for use as needed and I would like him to get a custom low-profile carbon fiber AFO. Return to see me after he has used the AFO for over a month. We will get him in to restore prosthetics and orthotics in [place].

## 2022-07-06 LAB — CBC
HCT: 40.2 % (ref 38.5–50.0)
Hemoglobin: 13.6 g/dL (ref 13.2–17.1)
MCH: 30.6 pg (ref 27.0–33.0)
MCHC: 33.8 g/dL (ref 32.0–36.0)
MCV: 90.5 fL (ref 80.0–100.0)
MPV: 12 fL (ref 7.5–12.5)
Platelets: 188 10*3/uL (ref 140–400)
RBC: 4.44 10*6/uL (ref 4.20–5.80)
RDW: 14.8 % (ref 11.0–15.0)
WBC: 9.7 10*3/uL (ref 3.8–10.8)

## 2022-07-06 LAB — COMPLETE METABOLIC PANEL WITH GFR
AG Ratio: 2.1 (calc) (ref 1.0–2.5)
ALT: 18 U/L (ref 9–46)
AST: 19 U/L (ref 10–35)
Albumin: 4.5 g/dL (ref 3.6–5.1)
Alkaline phosphatase (APISO): 79 U/L (ref 35–144)
BUN/Creatinine Ratio: 31 (calc) — ABNORMAL HIGH (ref 6–22)
BUN: 31 mg/dL — ABNORMAL HIGH (ref 7–25)
CO2: 25 mmol/L (ref 20–32)
Calcium: 10 mg/dL (ref 8.6–10.3)
Chloride: 104 mmol/L (ref 98–110)
Creat: 1.01 mg/dL (ref 0.70–1.28)
Globulin: 2.1 g/dL (calc) (ref 1.9–3.7)
Glucose, Bld: 113 mg/dL — ABNORMAL HIGH (ref 65–99)
Potassium: 4.4 mmol/L (ref 3.5–5.3)
Sodium: 140 mmol/L (ref 135–146)
Total Bilirubin: 0.6 mg/dL (ref 0.2–1.2)
Total Protein: 6.6 g/dL (ref 6.1–8.1)
eGFR: 78 mL/min/{1.73_m2} (ref >=60)

## 2022-07-06 LAB — LIPID PANEL
Cholesterol: 208 mg/dL — ABNORMAL HIGH (ref <200)
HDL: 112 mg/dL (ref >=40)
LDL Cholesterol (Calc): 82 mg/dL (calc)
Non-HDL Cholesterol (Calc): 96 mg/dL (calc) (ref <130)
Total CHOL/HDL Ratio: 1.9 (calc) (ref <5.0)
Triglycerides: 65 mg/dL (ref <150)

## 2022-07-06 LAB — TSH: TSH: 1.7 mIU/L (ref 0.40–4.50)

## 2022-07-06 LAB — VITAMIN B12: Vitamin B-12: 539 pg/mL (ref 200–1100)

## 2022-07-06 LAB — URIC ACID: Uric Acid, Serum: 6.4 mg/dL (ref 4.0–8.0)

## 2022-07-06 LAB — HEMOGLOBIN A1C
Hgb A1c MFr Bld: 5.8 % of total Hgb — ABNORMAL HIGH (ref <5.7)
Mean Plasma Glucose: 120 mg/dL
eAG (mmol/L): 6.6 mmol/L

## 2022-07-09 DIAGNOSIS — B351 Tinea unguium: Secondary | ICD-10-CM | POA: Diagnosis not present

## 2022-07-09 DIAGNOSIS — L03031 Cellulitis of right toe: Secondary | ICD-10-CM | POA: Diagnosis not present

## 2022-07-09 DIAGNOSIS — L03032 Cellulitis of left toe: Secondary | ICD-10-CM | POA: Diagnosis not present

## 2022-08-06 DIAGNOSIS — Z23 Encounter for immunization: Secondary | ICD-10-CM | POA: Diagnosis not present

## 2022-08-15 ENCOUNTER — Other Ambulatory Visit: Payer: Self-pay | Admitting: Sports Medicine

## 2022-08-15 ENCOUNTER — Ambulatory Visit (INDEPENDENT_AMBULATORY_CARE_PROVIDER_SITE_OTHER): Payer: Medicare Other | Admitting: Sports Medicine

## 2022-08-15 DIAGNOSIS — M14671 Charcot's joint, right ankle and foot: Secondary | ICD-10-CM | POA: Diagnosis not present

## 2022-08-15 DIAGNOSIS — K219 Gastro-esophageal reflux disease without esophagitis: Secondary | ICD-10-CM | POA: Diagnosis not present

## 2022-08-15 MED ORDER — PANTOPRAZOLE SODIUM 40 MG PO TBEC: 40.0000 mg | DELAYED_RELEASE_TABLET | Freq: Two times a day (BID) | ORAL | 3 refills | Status: DC

## 2022-08-15 MED ORDER — HYDROCODONE-ACETAMINOPHEN 5-325 MG PO TABS: 1.0000 | ORAL_TABLET | Freq: Three times a day (TID) | ORAL | 0 refills | Status: DC | PRN

## 2022-08-15 MED ORDER — OXYCODONE-ACETAMINOPHEN 5-325 MG PO TABS: 1.0000 | ORAL_TABLET | Freq: Three times a day (TID) | ORAL | 0 refills | Status: DC | PRN

## 2022-08-15 NOTE — Progress Notes (Signed)
    Procedures performed today:    None.  Independent interpretation of notes and tests performed by another provider:   None.  Brief History, Exam, Impression, and Recommendations:    GERD (gastroesophageal reflux disease) status post Nissen fundoplication Levi [name] is a pleasant 77 year old male, he is status post Nissen gastric fundoplication back at [facility] in the distant past, unfortunately he is having worsening acid reflux symptoms, as well as occasional dysphagia, no melena, hematochezia though he is having some loose stools. We will double his Protonix to twice daily, adding an H. pylori breath test. I would like him to have a second consultation with his general surgeon back at [facility]. As he is having some loose stools we will going get stool studies including cultures and C. difficile.  Charcot's joint of foot, right Known peripheral neuropathy noted on nerve conduction study, he has seen orthopedic foot and ankle surgery and was told there was no good surgical answer to his problem, Lyrica was increased to 75 mg 3 times daily, only marginal improvement, refilling his hydrocodone, I have also recommended to get a custom low-profile carbon fiber AFO. I do not think he has gotten the AFO yet, we will keep an eye on this, happy to refill his pain medicine for now.    ____________________________________________ [name]. [name], M.D., ABFM., CAQSM., AME. Primary Care and Sports Medicine [facility]  Adjunct Professor of [place] [employer] [facility] of Medicine  Risk manager

## 2022-08-15 NOTE — Assessment & Plan Note (Signed)
Levi [name] is a pleasant 77 year old male, he is status post Nissen gastric fundoplication back at [facility] in the distant past, unfortunately he is having worsening acid reflux symptoms, as well as occasional dysphagia, no melena, hematochezia though he is having some loose stools. We will double his Protonix to twice daily, adding an H. pylori breath test. I would like him to have a second consultation with his general surgeon back at [facility]. As he is having some loose stools we will going get stool studies including cultures and C. difficile.

## 2022-08-15 NOTE — Assessment & Plan Note (Signed)
Known peripheral neuropathy noted on nerve conduction study, he has seen orthopedic foot and ankle surgery and was told there was no good surgical answer to his problem, Lyrica was increased to 75 mg 3 times daily, only marginal improvement, refilling his hydrocodone, I have also recommended to get a custom low-profile carbon fiber AFO. I do not think he has gotten the AFO yet, we will keep an eye on this, happy to refill his pain medicine for now.

## 2022-08-16 DIAGNOSIS — K219 Gastro-esophageal reflux disease without esophagitis: Secondary | ICD-10-CM | POA: Diagnosis not present

## 2022-08-19 ENCOUNTER — Ambulatory Visit: Payer: Medicare Other | Admitting: Sports Medicine

## 2022-08-19 DIAGNOSIS — K219 Gastro-esophageal reflux disease without esophagitis: Secondary | ICD-10-CM | POA: Diagnosis not present

## 2022-08-19 LAB — C. DIFFICILE GDH AND TOXIN A/B
GDH ANTIGEN: NOT DETECTED
MICRO NUMBER:: 14731819
SPECIMEN QUALITY:: ADEQUATE
TOXIN A AND B: NOT DETECTED

## 2022-08-20 ENCOUNTER — Other Ambulatory Visit: Payer: Self-pay | Admitting: Cardiology

## 2022-08-20 ENCOUNTER — Other Ambulatory Visit: Payer: Self-pay | Admitting: Sports Medicine

## 2022-08-20 DIAGNOSIS — I480 Paroxysmal atrial fibrillation: Secondary | ICD-10-CM

## 2022-08-20 DIAGNOSIS — M48062 Spinal stenosis, lumbar region with neurogenic claudication: Secondary | ICD-10-CM

## 2022-08-20 LAB — H. PYLORI BREATH TEST

## 2022-08-20 NOTE — Telephone Encounter (Signed)
Prescription refill request for Eliquis received. Indication: Afib  Last office visit: 04/25/22 Marlou Porch)  Scr: 1.01 (07/05/22)  Age: 77 Weight: 90.7kg  Appropriate dose. Refill sent.

## 2022-08-21 ENCOUNTER — Other Ambulatory Visit: Payer: Self-pay

## 2022-08-21 DIAGNOSIS — K219 Gastro-esophageal reflux disease without esophagitis: Secondary | ICD-10-CM | POA: Diagnosis not present

## 2022-08-21 NOTE — Progress Notes (Signed)
Ordered labs per results.  

## 2022-08-22 DIAGNOSIS — K219 Gastro-esophageal reflux disease without esophagitis: Secondary | ICD-10-CM | POA: Diagnosis not present

## 2022-08-22 LAB — SALMONELLA/SHIGELLA CULT, CAMPY EIA AND SHIGA TOXIN RFL ECOLI
MICRO NUMBER: 14739759
MICRO NUMBER:: 14739760
MICRO NUMBER:: 14739761
Result:: NOT DETECTED
SHIGA RESULT:: NOT DETECTED
SPECIMEN QUALITY: ADEQUATE
SPECIMEN QUALITY:: ADEQUATE
SPECIMEN QUALITY:: ADEQUATE

## 2022-08-26 ENCOUNTER — Other Ambulatory Visit: Payer: Self-pay | Admitting: Cardiology

## 2022-08-26 LAB — H. PYLORI BREATH TEST: H. pylori Breath Test: NOT DETECTED

## 2022-09-02 ENCOUNTER — Ambulatory Visit
Admission: EM | Admit: 2022-09-02 | Discharge: 2022-09-02 | Disposition: A | Discharge status: Home / Self Care | Payer: Medicare Other | Attending: Family Medicine | Admitting: Family Medicine

## 2022-09-02 DIAGNOSIS — Y92009 Unspecified place in unspecified non-institutional (private) residence as the place of occurrence of the external cause: Secondary | ICD-10-CM

## 2022-09-02 DIAGNOSIS — S81812A Laceration without foreign body, left lower leg, initial encounter: Secondary | ICD-10-CM

## 2022-09-02 DIAGNOSIS — M25521 Pain in right elbow: Secondary | ICD-10-CM

## 2022-09-02 DIAGNOSIS — W19XXXA Unspecified fall, initial encounter: Secondary | ICD-10-CM

## 2022-09-02 DIAGNOSIS — M25551 Pain in right hip: Secondary | ICD-10-CM

## 2022-09-02 DIAGNOSIS — T148XXA Other injury of unspecified body region, initial encounter: Secondary | ICD-10-CM

## 2022-09-02 MED ORDER — ACETAMINOPHEN 325 MG PO TABS
650.0000 mg | ORAL_TABLET | Freq: Once | ORAL | Status: AC
  Administered 2022-09-02: 650 mg via ORAL

## 2022-09-02 NOTE — Discharge Instructions (Signed)
Change dressings once a day Watch for infection You can leave the tape on for about a week.  It will soak off Return for any problems

## 2022-09-02 NOTE — ED Triage Notes (Addendum)
Pt c/o fall this am about 730 this am. States he fell onto his RT elbow and is also hurting in his RT groin. States he also has a laceration to LT shin he reaggravated from yesterday. Currently covered with gauze and bandaids. Pain 7/10 (RT elbow)  Last tdap 12/2019

## 2022-09-03 DIAGNOSIS — K219 Gastro-esophageal reflux disease without esophagitis: Secondary | ICD-10-CM | POA: Diagnosis not present

## 2022-09-05 ENCOUNTER — Ambulatory Visit
Admission: EM | Admit: 2022-09-05 | Discharge: 2022-09-05 | Disposition: A | Discharge status: Home / Self Care | Payer: Medicare Other | Attending: Family Medicine | Admitting: Family Medicine

## 2022-09-05 ENCOUNTER — Encounter: Payer: Self-pay | Admitting: Emergency Medicine

## 2022-09-05 ENCOUNTER — Ambulatory Visit (INDEPENDENT_AMBULATORY_CARE_PROVIDER_SITE_OTHER): Payer: Medicare Other

## 2022-09-05 DIAGNOSIS — S299XXA Unspecified injury of thorax, initial encounter: Secondary | ICD-10-CM

## 2022-09-05 DIAGNOSIS — M25551 Pain in right hip: Secondary | ICD-10-CM | POA: Diagnosis not present

## 2022-09-05 DIAGNOSIS — S79911A Unspecified injury of right hip, initial encounter: Secondary | ICD-10-CM

## 2022-09-05 DIAGNOSIS — S2231XA Fracture of one rib, right side, initial encounter for closed fracture: Secondary | ICD-10-CM | POA: Diagnosis not present

## 2022-09-05 DIAGNOSIS — Z043 Encounter for examination and observation following other accident: Secondary | ICD-10-CM | POA: Diagnosis not present

## 2022-09-05 DIAGNOSIS — S2243XA Multiple fractures of ribs, bilateral, initial encounter for closed fracture: Secondary | ICD-10-CM | POA: Diagnosis not present

## 2022-09-05 MED ORDER — OXYCODONE-ACETAMINOPHEN 5-325 MG PO TABS: 1.0000 | ORAL_TABLET | Freq: Three times a day (TID) | ORAL | 0 refills | Status: AC | PRN

## 2022-09-05 NOTE — ED Provider Notes (Signed)
Levi [name] CARE    CSN: [id number] Arrival date & time: 09/05/22  1036      History   Chief Complaint Chief Complaint  Patient presents with   Fall   Hip Pain    HPI Levi [name] is a 77 y.o. male.   HPI Very pleasant 77 year old male presents with right hip pain secondary to fall on Monday of this week.  Patient reports history of bilateral hip replacements.  PMH significant for PAF, COPD, LV dysfunction, and chronic anticoagulation.  Patient is accompanied by his wife this afternoon.  Past Medical History:  Diagnosis Date   Agent orange exposure 1970's   takes Imdur,Diltiazem, and Atacand daily   ALLERGIC RHINITIS    Allergy    seasonal allergies   Asthma    uses inhaler and nebulizers   Bruises easily    d/t prednisone daily   Cataract    CHF (congestive heart failure)    dx-on meds to treat   Chronic bronchitis    "used to get it q yr; last time ?2008" (04/28/2012)   COPD (chronic obstructive pulmonary disease)    agent orange exposure   Degenerative disk disease    "everywhere" (04/28/2012)   Diverticulitis    Dysrhythmia    afib   Elevated uric acid in blood    takes Allopurinol daily   Emphysema of lung    Enlarged prostate    but not on any meds   GERD (gastroesophageal reflux disease)    takes Protonix daily   H/O hiatal hernia    Headache    History of pneumonia    a. 2010   Hyperlipidemia    takes Lipitor daily; pt. states he takes a preventive   Hypothyroidism    Joint pain    Joint swelling    Neuromuscular disorder    bilat legs, and bilat arms   PAF (paroxysmal atrial fibrillation)    a. Dx 04/2012, CHA2DS2VASc = 1 (age);  b. 04/2012 Echo: EF 40-50%, mild MR.   Personal history of colonic adenomas 02/09/2013   Pneumonia    Shortness of breath dyspnea    Thyroid disease    no taking meds-caused dizziness    Patient Active Problem List   Diagnosis Date Noted   Primary osteoarthritis of right shoulder, status post left  shoulder arthroplasty 07/05/2022   Shingles 05/16/2022   Olecranon bursitis, right elbow 04/04/2022   Vertigo 04/04/2022   Orthostatic hypotension 03/21/2022   BPH (benign prostatic hyperplasia) 02/21/2022   Annual physical exam 01/22/2022   Charcot's joint of foot, right 01/22/2022   Lower extremity edema 08/08/2021   Heartburn    Atypical chest pain    Cervical radicular pain 06/11/2021   Chronic anticoagulation 03/15/2021   Peripheral neuropathy 02/22/2021   LV dysfunction 02/22/2021   Essential (primary) hypertension 11/15/2020   Arthritis of carpometacarpal [facility]) joint of right thumb 08/07/2020   Cellulitis 03/22/2020   COPD (chronic obstructive pulmonary disease) 03/21/2020   Chest wall pain 09/21/2019   GERD (gastroesophageal reflux disease) status post Nissen fundoplication 12/03/2017   Pneumothorax, left 04/11/2017   Lumbar stenosis with neurogenic claudication 04/07/2017   History of total right hip arthroplasty 07/31/2015   Primary osteoarthritis of right knee 06/17/2014   Thrush of mouth and esophagus 04/03/2014   Palpitation 03/28/2014   Pseudoarthrosis of lumbar spine 06/15/2013   Hyperlipidemia    History of colonic polyps 02/09/2013   Atrial fibrillation 05/11/2012   Osteoarthritis of left knee 01/10/2012  Seasonal and perennial allergic rhinitis 07/23/2010   Asthma, moderate persistent 03/06/2009    Past Surgical History:  Procedure Laterality Date   49 HOUR PH STUDY N/A 05/30/2021   Procedure: 24 HOUR PH STUDY;  Surgeon: Shellia Cleverly, DO;  Location: [facility];  Service: Gastroenterology;  Laterality: N/A;   ANTERIOR CERVICAL DECOMP/DISCECTOMY FUSION N/A 07/21/2012   Procedure: ANTERIOR CERVICAL DECOMPRESSION/DISCECTOMY FUSION 2 LEVELS;  Surgeon: Barnett Abu, MD;  Location: [facility];  Service: Neurosurgery;  Laterality: N/A;  C4-5 C5-6 Anterior cervical decompression/diskectomy/fusion   ANTERIOR LAT LUMBAR FUSION N/A 04/07/2017   Procedure:  Thoracic twelve-Lumbar one Anterolateral decompression/fusion;  Surgeon: Barnett Abu, MD;  Location: [facility] OR;  Service: Neurosurgery;  Laterality: N/A;   BACK SURGERY     x 3   CARDIAC CATHETERIZATION  11/2008   Dr. Anne Fu - 20% calcified non flow limiting left main, 50% EF apical hypokinesis   CERVICAL DISC ARTHROPLASTY N/A 04/07/2017   Procedure: Cervical six-seven Disc arthroplasty;  Surgeon: Barnett Abu, MD;  Location: [facility] OR;  Service: Neurosurgery;  Laterality: N/A;   CHEST TUBE INSERTION Left 04/11/2017   Procedure: CHEST TUBE INSERTION;  Surgeon: Kerin Perna, MD;  Location: [facility] OR;  Service: Thoracic;  Laterality: Left;   CHOLECYSTECTOMY     COLONOSCOPY  2016   CG-MAC-miralax(good)servere TICS/TA x 2   ESOPHAGEAL MANOMETRY N/A 05/30/2021   Procedure: ESOPHAGEAL MANOMETRY (EM);  Surgeon: Shellia Cleverly, DO;  Location: [facility];  Service: Gastroenterology;  Laterality: N/A;  ph impedence   ESOPHAGOGASTRODUODENOSCOPY     EYE SURGERY Bilateral 2011   steroidal encapsulation" (04/28/2012)   FOOT SURGERY Right    x 2   KNEE ARTHROSCOPY Bilateral    LATERAL / POSTERIOR COMBINED FUSION LUMBAR SPINE  2012   LEFT ATRIAL APPENDAGE OCCLUSION N/A 02/22/2021   Procedure: LEFT ATRIAL APPENDAGE OCCLUSION;  Surgeon: Lanier Prude, MD;  [facility]: [facility];  Service: Cardiovascular;  Laterality: N/A;   POLYPECTOMY  2016   severe TICS/TA x 2   POSTERIOR CERVICAL LAMINECTOMY WITH MET- RX Right 06/11/2021   Procedure: Right Cervical six-seven  Laminectomy and foraminotomy with metrex;  Surgeon: Barnett Abu, MD;  Location: [facility] OR;  Service: Neurosurgery;  Laterality: Right;   REVERSE SHOULDER ARTHROPLASTY  04/28/2012   Procedure: REVERSE SHOULDER ARTHROPLASTY;  Surgeon: Mable Paris, MD;  Location: [facility] OR;  Service: Orthopedics;  Laterality: Left;  Left shouder reverse total shoulder arthroplasty   SHOULDER SURGERY     TEE WITHOUT CARDIOVERSION N/A 02/22/2021    Procedure: TRANSESOPHAGEAL ECHOCARDIOGRAM (TEE);  Surgeon: Lanier Prude, MD;  Location: [facility] INVASIVE CV LAB;  Service: Cardiovascular;  Laterality: N/A;   TENDON REPAIR Right 08/07/2020   Procedure: RIGHT HAND LIGAMENT RECONSTRUCTION AND TENDON INTERPOSITION;  Surgeon: Jodi Geralds, MD;  Location: [facility];  Service: Orthopedics;  Laterality: Right;   TONSILLECTOMY AND ADENOIDECTOMY  1954   TOTAL HIP ARTHROPLASTY Left 2011   "left" (04/28/2012)   TOTAL HIP ARTHROPLASTY Right 07/31/2015   Procedure: TOTAL HIP ARTHROPLASTY ANTERIOR APPROACH;  Surgeon: Jodi Geralds, MD;  Location: [facility] OR;  Service: Orthopedics;  Laterality: Right;   TOTAL KNEE ARTHROPLASTY  01/10/2012   Procedure: TOTAL KNEE ARTHROPLASTY;  Surgeon: Harvie Junior, MD;  Location: [facility] OR;  Service: Orthopedics;;  left total knee arthroplasty   TOTAL KNEE ARTHROPLASTY Right 06/17/2014   Procedure: TOTAL KNEE ARTHROPLASTY;  Surgeon: Harvie Junior, MD;  Location: [facility] OR;  Service: Orthopedics;  Laterality: Right;  toupet fundolplication  11/01/2021       Home Medications    Prior to Admission medications   Medication Sig Start Date End Date Taking? Authorizing Provider  oxyCODONE-acetaminophen (PERCOCET/ROXICET) 5-325 MG tablet Take 1 tablet by mouth every 8 (eight) hours as needed for up to 10 days for severe pain. 09/05/22 09/15/22 Yes Trevor Iha, FNP  albuterol (VENTOLIN HFA) 108 (90 Base) MCG/ACT inhaler INHALE 2 PUFFS INTO THE LUNGS EVERY 4 HOURS AS NEEDED FOR WHEEZING OR SHORTNESS OF BREATH. 01/07/22   Jetty Duhamel D, MD  allopurinol (ZYLOPRIM) 100 MG tablet Take 100 mg by mouth daily.    [provider]  apixaban (ELIQUIS) 5 MG TABS tablet TAKE 1 TABLET BY MOUTH TWICE A DAY 08/20/22   Jake Bathe, MD  atorvastatin (LIPITOR) 20 MG tablet TAKE 1 TABLET BY MOUTH EVERY DAY 03/20/22   Jake Bathe, MD  cyclobenzaprine (FLEXERIL) 10 MG tablet TAKE 1 TABLET BY MOUTH EVERYDAY AT BEDTIME 07/02/22    Monica Becton, MD  diclofenac Sodium (VOLTAREN) 1 % GEL Apply 2 g topically daily as needed (Pain).    [provider]  diltiazem (CARDIZEM CD) 180 MG 24 hr capsule TAKE 1 CAPSULE BY MOUTH EVERY DAY 11/20/21   Jake Bathe, MD  diltiazem (CARDIZEM) 30 MG tablet Take 30 mg by mouth as needed (heart rate over 120).    [provider]  DULoxetine (CYMBALTA) 30 MG capsule Take 1 capsule (30 mg total) by mouth daily. 12/04/21   Lomax, Amy, NP  furosemide (LASIX) 40 MG tablet Take 1 tablet (40 mg total) by mouth daily. As directed 08/08/21   Jake Bathe, MD  ipratropium-albuterol (DUONEB) 0.5-2.5 (3) MG/3ML SOLN 1 NEBULE EVERY 6 HOURS AS NEEDED. **J45.3** 03/22/20   Jetty Duhamel D, MD  isosorbide mononitrate (IMDUR) 30 MG 24 hr tablet TAKE 1 TABLET BY MOUTH EVERY DAY 08/26/22   Jake Bathe, MD  Nebulizers (COMPRESSOR/NEBULIZER) MISC Use as directed 12/01/17   Waymon Budge, MD  Olodaterol HCl (STRIVERDI RESPIMAT) 2.5 MCG/ACT AERS INHALE 2 PUFFS BY MOUTH INTO THE LUNGS DAILY 01/18/22   Waymon Budge, MD  pantoprazole (PROTONIX) 40 MG tablet Take 1 tablet (40 mg total) by mouth 2 (two) times daily before a meal. 08/15/22   Monica Becton, MD  potassium chloride SA (KLOR-CON M20) 20 MEQ tablet Take 1 tablet (20 mEq total) by mouth daily. 09/05/21   Jake Bathe, MD  predniSONE (DELTASONE) 10 MG tablet TAKE 2 TABLETS (20 MG TOTAL) BY MOUTH DAILY. CONTINUOUS 04/22/22   Young, Joni Fears D, MD  pregabalin (LYRICA) 75 MG capsule Take 1 capsule (75 mg total) by mouth 3 (three) times daily. 05/16/22   Monica Becton, MD  valACYclovir (VALTREX) 1000 MG tablet Take 1 tablet (1,000 mg total) by mouth 2 (two) times daily. 05/16/22   Monica Becton, MD    Family History Family History  Problem Relation Age of Onset   Diabetes Mother    Heart disease Father        Died 107, MI   Prostate cancer Brother    Colon polyps Brother    Colon polyps Brother     Colon cancer Brother        Dx age 51   Colon cancer Other    Esophageal cancer Neg Hx    Rectal cancer Neg Hx    Stomach cancer Neg Hx    Pancreatic cancer Neg Hx  Kidney disease Neg Hx    Liver disease Neg Hx    Neuropathy Neg Hx     Social History Social History   Tobacco Use   Smoking status: Never   Smokeless tobacco: Never  Vaping Use   Vaping Use: Never used  Substance Use Topics   Alcohol use: No    Alcohol/week: 0.0 standard drinks of alcohol   Drug use: No     Allergies   Morphine and related and Levaquin [levofloxacin]   Review of Systems Review of Systems   Physical Exam Triage Vital Signs ED Triage Vitals  Enc Vitals Group     BP 09/05/22 1122 139/73     Pulse Rate 09/05/22 1122 77     Resp 09/05/22 1122 16     Temp 09/05/22 1122 97.7 F (36.5 C)     Temp Source 09/05/22 1122 Oral     SpO2 09/05/22 1122 96 %     Weight --      Height --      Head Circumference --      Peak Flow --      Pain Score 09/05/22 1123 6     Pain Loc --      Pain Edu? --      Excl. in GC? --    No data found.  Updated Vital Signs BP 139/73 (BP Location: Right Arm)   Pulse 77   Temp 97.7 F (36.5 C) (Oral)   Resp 16   SpO2 96%   Physical Exam Vitals and nursing note reviewed.  Constitutional:      Appearance: Normal appearance. He is normal weight.  HENT:     Head: Normocephalic and atraumatic.     Mouth/Throat:     Mouth: Mucous membranes are moist.     Pharynx: Oropharynx is clear.  Eyes:     Extraocular Movements: Extraocular movements intact.     Conjunctiva/sclera: Conjunctivae normal.     Pupils: Pupils are equal, round, and reactive to light.  Cardiovascular:     Rate and Rhythm: Normal rate and regular rhythm.     Pulses: Normal pulses.     Heart sounds: Normal heart sounds.  Pulmonary:     Effort: Pulmonary effort is normal.     Breath sounds: Normal breath sounds. No wheezing, rhonchi or rales.     Comments: TTP over right lateral  seventh through ninth ribs, no deformity noted Musculoskeletal:        General: Normal range of motion.     Cervical back: Normal range of motion and neck supple.  Skin:    General: Skin is warm and dry.  Neurological:     General: No focal deficit present.     Mental Status: He is alert and oriented to person, place, and time. Mental status is at baseline.      UC Treatments / Results  Labs (all labs ordered are listed, but only abnormal results are displayed) Labs Reviewed - No data to display  EKG   Radiology DG Ribs Unilateral W/Chest Right  Result Date: 09/05/2022 CLINICAL DATA:  Trauma, fall EXAM: RIGHT RIBS AND CHEST - 3+ VIEW COMPARISON:  01/06/2022 FINDINGS: There is recent minimally displaced fracture in the anterolateral aspect of right ninth rib. There is 1 mm offset in alignment. There are multiple old healed bilateral rib fractures. Cardiac size is within normal limits. Lung fields are clear of any pulmonary edema or focal consolidation. Left hemidiaphragm is elevated. There is reverse arthroplasty  in left shoulder. There is surgical fusion in lower cervical spine and in lumbar spine. There is no pleural effusion or pneumothorax. IMPRESSION: There is recent minimally displaced fracture in the anterolateral aspect of right ninth rib. There is no focal pulmonary consolidation. There is no pneumothorax. Deformities are seen in multiple bilateral ribs consistent with old healed fractures. Electronically Signed   By: Ernie Avena M.D.   On: 09/05/2022 12:26   DG Hips Bilat W or Wo Pelvis 2 Views  Result Date: 09/05/2022 CLINICAL DATA:  Trauma, fall EXAM: DG HIP (WITH OR WITHOUT PELVIS) 2V BILAT COMPARISON:  03/21/2022 FINDINGS: There is previous arthroplasty in both hips. There is lumbar spinal fusion. There is dense contrast in the lumen of colon partly obscuring the bony structures. Diverticula are seen in sigmoid colon. No recent fracture or dislocation is seen. There  are multiple smooth marginated calcifications adjacent to both hips and the right iliac bone which may be residual from previous trauma and previous surgery. Osteopenia is seen in bony structures. Arterial calcifications are seen in soft tissues. IMPRESSION: Previous bilateral hip arthroplasty. No recent fracture or dislocation is seen. Osteopenia. Arteriosclerosis.  Diverticulosis of colon. Electronically Signed   By: Ernie Avena M.D.   On: 09/05/2022 12:21    Procedures Procedures (including critical care time)  Medications Ordered in UC Medications - No data to display  Initial Impression / Assessment and Plan / UC Course  I have reviewed the triage vital signs and the nursing notes.  Pertinent labs & imaging results that were available during my care of the patient were reviewed by me and considered in my medical decision making (see chart for details).    MDM: 1.  Closed fracture of 1 rib of right side, initial encounter-right rib x-ray revealed above Rx'd Percocet 5/325 mg every 8 hours, as needed for rib pain.  2.  Right hip pain-bilateral hip and pelvis revealed above all hardware is intact with no fractures. Advised patient of close fracture of ninth rib advised may take Percocet every 8 hours, as needed for pain.  Advised bilateral hip hardware intact in place.  Encouraged patient to increase daily water intake to 64 ounces while taking this medication.  Patient is advised of sedative effects of Percocet.  Patient discharged home, hemodynamically stable. Final Clinical Impressions(s) / UC Diagnoses   Final diagnoses:  Right hip pain  Closed fracture of one rib of right side, initial encounter     Discharge Instructions      Advised patient of close fracture of ninth rib advised may take Percocet every 8 hours, as needed for pain.  Advised bilateral hip hardware intact in place.  Encourage patient to increase daily water intake to 64 ounces while taking this medication.   Patient is advised of sedative effects of Percocet.     ED Prescriptions     Medication Sig Dispense Auth. Provider   oxyCODONE-acetaminophen (PERCOCET/ROXICET) 5-325 MG tablet Take 1 tablet by mouth every 8 (eight) hours as needed for up to 10 days for severe pain. 30 tablet Trevor Iha, FNP      I have reviewed the PDMP during this encounter.   Trevor Iha, FNP 09/05/22 1342

## 2022-09-05 NOTE — Discharge Instructions (Addendum)
Advised patient of close fracture of ninth rib advised may take Percocet every 8 hours, as needed for pain.  Advised bilateral hip hardware intact in place.  Encourage patient to increase daily water intake to 64 ounces while taking this medication.  Patient is advised of sedative effects of Percocet.

## 2022-09-05 NOTE — ED Triage Notes (Addendum)
Fall Monday, was evaluated here. States he was having right sided hip pain on Monday but no images were taken of it. The pain has steadily increased in his right hip to the point where he's having trouble walking now. Hx of bilateral hip replacements. Also c/o right sided rib pain that wasn't initially too bad after the fall, but believes he exacerbated it through trying to compensate for the pain in his hip.   Is currently on Eliquis, denies head injury with fall.

## 2022-09-11 ENCOUNTER — Other Ambulatory Visit: Payer: Self-pay | Admitting: Internal Medicine

## 2022-09-11 DIAGNOSIS — M1A9XX0 Chronic gout, unspecified, without tophus (tophi): Secondary | ICD-10-CM

## 2022-09-13 ENCOUNTER — Ambulatory Visit (INDEPENDENT_AMBULATORY_CARE_PROVIDER_SITE_OTHER): Payer: Medicare Other

## 2022-09-13 ENCOUNTER — Ambulatory Visit (INDEPENDENT_AMBULATORY_CARE_PROVIDER_SITE_OTHER): Payer: Medicare Other | Admitting: Sports Medicine

## 2022-09-13 DIAGNOSIS — L97202 Non-pressure chronic ulcer of unspecified calf with fat layer exposed: Secondary | ICD-10-CM

## 2022-09-13 DIAGNOSIS — M48062 Spinal stenosis, lumbar region with neurogenic claudication: Secondary | ICD-10-CM

## 2022-09-13 DIAGNOSIS — M545 Low back pain, unspecified: Secondary | ICD-10-CM | POA: Diagnosis not present

## 2022-09-13 DIAGNOSIS — I5022 Chronic systolic (congestive) heart failure: Secondary | ICD-10-CM | POA: Diagnosis not present

## 2022-09-13 DIAGNOSIS — I83002 Varicose veins of unspecified lower extremity with ulcer of calf: Secondary | ICD-10-CM | POA: Diagnosis not present

## 2022-09-13 DIAGNOSIS — I83009 Varicose veins of unspecified lower extremity with ulcer of unspecified site: Secondary | ICD-10-CM | POA: Insufficient documentation

## 2022-09-13 MED ORDER — DOXYCYCLINE HYCLATE 100 MG PO TABS
100.0000 mg | ORAL_TABLET | Freq: Two times a day (BID) | ORAL | 0 refills | Status: DC
Start: 2022-09-13 — End: 2022-09-23

## 2022-09-13 MED ORDER — FUROSEMIDE 40 MG PO TABS: 40.0000 mg | ORAL_TABLET | Freq: Two times a day (BID) | ORAL | 3 refills | Status: DC

## 2022-09-13 NOTE — Assessment & Plan Note (Signed)
Levi [name] did have a fall, he also has chronic venous stasis insufficiency, dermatitis and ulceration. Today we placed Unna boots, I am going to add some doxycycline, he will return weekly for nurse visits for Unna boot changes, I would also like him to get a consultation with the [facility]. Doubling his Lasix. I would like to see him back in about a month.

## 2022-09-13 NOTE — Addendum Note (Signed)
Addended by: Monica Becton on: 09/13/2022 02:14 PM   Modules accepted: Orders

## 2022-09-13 NOTE — Progress Notes (Signed)
    Procedures performed today:    Bilateral Unna boots placed today.  Independent interpretation of notes and tests performed by another provider:   None.  Brief History, Exam, Impression, and Recommendations:    Venous stasis ulcer Levi [name] did have a fall, he also has chronic venous stasis insufficiency, dermatitis and ulceration. Today we placed Unna boots, I am going to add some doxycycline, he will return weekly for nurse visits for Unna boot changes, I would also like him to get a consultation with the [facility]. Doubling his Lasix. I would like to see him back in about a month.  Chronic systolic heart failure Levi [name] does have chronic systolic heart failure with mildly reduced ejection fraction, approximately 50%. I am going to bump up his Lasix for now.    ____________________________________________ [name]. [name], M.D., ABFM., CAQSM., AME. Primary Care and Sports Medicine [facility]  Adjunct Professor of Family Medicine  [employer]  Restaurant manager, fast food

## 2022-09-13 NOTE — Assessment & Plan Note (Signed)
Levi [name] does have chronic systolic heart failure with mildly reduced ejection fraction, approximately 50%. I am going to bump up his Lasix for now.

## 2022-09-14 ENCOUNTER — Other Ambulatory Visit: Payer: Self-pay | Admitting: Cardiology

## 2022-09-18 DIAGNOSIS — K219 Gastro-esophageal reflux disease without esophagitis: Secondary | ICD-10-CM | POA: Diagnosis not present

## 2022-09-20 ENCOUNTER — Ambulatory Visit (INDEPENDENT_AMBULATORY_CARE_PROVIDER_SITE_OTHER): Payer: Medicare Other | Admitting: Sports Medicine

## 2022-09-20 VITALS — BP 120/68 | HR 78 | Ht 74.0 in | Wt 201.2 lb

## 2022-09-20 DIAGNOSIS — L97202 Non-pressure chronic ulcer of unspecified calf with fat layer exposed: Secondary | ICD-10-CM | POA: Diagnosis not present

## 2022-09-20 DIAGNOSIS — I83002 Varicose veins of unspecified lower extremity with ulcer of calf: Secondary | ICD-10-CM

## 2022-09-20 NOTE — Patient Instructions (Signed)
Keep upcoming appt scheduled for  09/24/22.

## 2022-09-20 NOTE — Progress Notes (Signed)
   Established Patient Office Visit  Subjective   Patient ID: Levi [name], male    DOB: 01-Feb-1946  Age: 77 y.o. MRN: [id number]  Chief Complaint  Patient presents with   Wound Check    Roland Rack boot removal - nurse visit. Wound checked by Dr. [name].    HPI  Wound check- unna boot removal nurse visit. Patient states has upcoming appt with vein specialist for Tuesday 09/24/2022.   ROS    Objective:     BP 120/68   Pulse 78   Ht 6\' 2"  (1.88 m)   Wt 201 lb 4 oz (91.3 kg)   SpO2 97%   BMI 25.84 kg/m    Physical Exam   No results found for any visits on 09/20/22.    The ASCVD Risk score (Arnett DK, et al., 2019) failed to calculate for the following reasons:   The valid HDL cholesterol range is 20 to 100 mg/dL    Assessment & Plan:  Unna boot removal and replacement. Nurse visit. Unna boot removed bilateral legs.  Left leg wound  with odor and slight brown purulent drainage. Right leg  with intact wound without drainage.Wound examined by Dr. [name]. Patient instructed to keep appointment with  vein specialist on Tuesday 09/24/22 and they will then take over care for bilateral venous stasis ulcer. Unna boots replaced bilaterally.  Problem List Items Addressed This Visit   None   No follow-ups on file.    Elizabeth Palau, LPN

## 2022-09-21 NOTE — Progress Notes (Addendum)
Subjective:    Patient ID: Levi [name], male    DOB: [birth year], 77 y.o.   MRN: [id number] HPI  Male never smoker followed for allergy/asthma, rhinitis, history  ABPA, complicated by PAfib, GERD PFT- 01/04/2015-minimal restriction, minimal diffusion defect, insignificant response to bronchodilator. FVC 4.55/86%, FEV1 3.28/83%, FEV1/FVC 0.72, TLC 75%, DLCO 77 Office Spirometry 10/25/16-mild restriction of exhaled volume, minimal obstruction. FVC 4.04/78%, FEV1 2.71/71%, ratio 0.67, FEF 25-75% 1.68/58% Respiratory allergy profile 10/26/15- total IgE 60, minor elevations for dust mites and Aspergillus fumigatus ---------------------------------------------------------------------------------------------------.   09/20/21- 77 year old male never smoker followed for allergy/asthma, rhinitis, history ABPA, complicated by PAfib, CHF, GERD, Hypogammaglobulinemia, Chronic Steroid Therapy,  -Striverdi 2 puffs daily, pred maint 10 mg daily, Neb Duoneb,  albuterol hfa ------Follow up. Patient has no complaints Frequent cough but considers breathing stable. Pending hiatal hernia surgery. Hopefully this will help cough. Says edema currently better. CXR 07/07/21- IMPRESSION: 1. No acute findings. 2. Healing right-sided sixth rib fracture.  No new fractures.  09/23/22- 77 year old male never smoker followed for allergy/asthma, rhinitis, history ABPA, complicated by PAfib, CHF, GERD, Hypogammaglobulinemia, Chronic Steroid Therapy,  -Striverdi 2 puffs daily, pred maint 10 mg daily, Neb Duoneb,  albuterol hfa Recent fall with right rib fx He is working with cardiology actively now on congestive heart failure and understands the overlap of symptoms.  Asthma control has been good.  He does continue maintenance prednisone and Striverdi.  Occasional transient wheeze but no significant events.  Unfortunately he has had trouble with peripheral neuropathy, falls and fractures. CXR/ Right ribs  09/05/22- IMPRESSION: There is recent minimally displaced fracture in the anterolateral aspect of right ninth rib. There is no focal pulmonary consolidation. There is no pneumothorax. Deformities are seen in multiple bilateral ribs consistent with old healed fractures.  Addendum 10/30/22- surgical clearance. He is clear from pulmonary standpoint for planned foot surgery.  Recommend cardiac clearance. Note he is on maintenance prednisone.   ROS-see HPI   + = positive Constitutional:    weight loss, night sweats, fevers, chills, fatigue, lassitude. HEENT:    headaches, difficulty swallowing, tooth/dental problems, sore throat,       sneezing, itching, ear ache, nasal congestion, post nasal drip, snoring CV:    chest pain, orthopnea, PND, +swelling in lower extremities, anasarca,                                            dizziness, palpitations Resp:   + shortness of breath with exertion or at rest.                productive cough,  + non-productive cough, coughing up of blood.              change in color of mucus.   wheezing.   Skin:    rash or lesions. GI:  No-   heartburn, indigestion, abdominal pain, nausea, vomiting, diarrhea,                 change in bowel habits, loss of appetite GU: dysuria, change in color of urine, no urgency or frequency.   flank pain. MS:   joint pain, stiffness, decreased range of motion, back pain.. L scapula not tender. Neuro-     nothing unusual Psych:  change in mood or affect.  depression or anxiety.   memory loss.    Objective:  OBJ- Physical Exam  General- Alert, Oriented, Affect-appropriate, Distress- none acute, tall/thin Skin- rash-none, lesions- none, excoriation- none Lymphadenopathy- none Head- atraumatic            Eyes- Gross vision intact, PERRLA, conjunctivae and secretions clear            Ears- Hearing, canals-normal            Nose- Clear, no-Septal dev, mucus, polyps, erosion, perforation             Throat- Mallampati II , mucosa  clear , drainage- none, tonsils- atrophic Neck- flexible , trachea midline, no stridor , thyroid nl, carotid no bruit Chest - symmetrical excursion , unlabored           Heart/CV- RRR , no murmur , no gallop  , no rub, nl s1 s2                           - JVD+trace , edema+2-3 pitting, stasis changes- none, varices+ Left calf           Lung- +clear,  wheeze- none, cough- none , dullness-none, rub- none           Chest wall-  Abd-  Br/ Gen/ Rectal- Not done, not indicated Extrem- cyanosis- none, clubbing, none, atrophy- none, strength- nl Neuro- + head bob and hands tremor    Assessment & Plan:

## 2022-09-23 ENCOUNTER — Encounter: Payer: Self-pay | Admitting: Internal Medicine

## 2022-09-23 ENCOUNTER — Ambulatory Visit (INDEPENDENT_AMBULATORY_CARE_PROVIDER_SITE_OTHER): Payer: Medicare Other | Admitting: Internal Medicine

## 2022-09-23 VITALS — BP 120/80 | HR 88 | Ht 74.0 in | Wt 200.2 lb

## 2022-09-23 DIAGNOSIS — J4540 Moderate persistent asthma, uncomplicated: Secondary | ICD-10-CM | POA: Diagnosis not present

## 2022-09-23 DIAGNOSIS — I5022 Chronic systolic (congestive) heart failure: Secondary | ICD-10-CM

## 2022-09-23 NOTE — Patient Instructions (Signed)
We don't need to change your breathing meds  Please call if we can help

## 2022-09-23 NOTE — Assessment & Plan Note (Signed)
Chronic peripheral edema and dyspnea on exertion.  Followed by cardiology

## 2022-09-23 NOTE — Assessment & Plan Note (Signed)
Moderate persistent uncomplicated Plan-continue current meds as discussed

## 2022-09-24 ENCOUNTER — Telehealth: Payer: Self-pay | Admitting: Sports Medicine

## 2022-09-24 NOTE — Telephone Encounter (Signed)
Contacted Sindy Gu[facility] to schedule their annual wellness visit. Appointment made for 10/03/2022 at 8AM.  Cira Servant Patient Access Advocate II Direct Dial: [phone]

## 2022-09-24 NOTE — Telephone Encounter (Signed)
Received incoming call from patient stating that the [facility] doesn't accept his [employer]. Patient had an appointment today but  when he arrived was advised that he could be a self-pay and pay for the visit out of pocket. Patient also states he needs his boot changed by this Friday. Advised patient that I would contact the referred office to get information about what happened with his visit.

## 2022-09-24 NOTE — Telephone Encounter (Signed)
Patient called to state that [facility] will not accept his insurance. Patient requesting new location and boots need to be changed by Friday per last provider. Katha Hamming

## 2022-09-25 NOTE — Telephone Encounter (Signed)
Spoke with Clydell Hakim( Practice Admin) and she explained that one of the [place] office is out of network with [employer] and the other one is in network. Patient will be seen tomorrow 09/26/2022 at 1:00 pm free of charge. Patient will have an Ultrasound done first and then see Dr. Guss Bunde for treatment. Moving forward if patient needs treatment he may need to go over to the [facility] office.    Pt aware of appointment 09/26/2022 at 1:00pm with Dr. Guss Bunde. I apologized for everything and patient voiced understanding and happy with resolution.

## 2022-09-25 NOTE — Telephone Encounter (Signed)
Received incoming voicemail from [name] apologizing for patients experience and states that [name]) would be calling to assist with a resolution. Levi states that patients insurance is accepted and there was miscommunication.   Pt has been updated on everything and knows that I will contact him when I have an appointment date & time. Patient mentioned that his unaboot dressing needed to change by Friday 09/27/2022.

## 2022-09-25 NOTE — Telephone Encounter (Signed)
[facility] the [facility] to ask about patients referral and see what happened with patients appointment yesterday morning. The scheduling Rep advised that not all offices accept [employer], Rep proceeded to assist with rescheduling patients appointment. Scheduling Rep offered 10/02/2022 but then retracted, next appointment offered was 11/01/2022. I declined appointment stating it was too far out. Scheduling Rep then offered an appointment in [facility] off and I declined this appointment as well due to travel distance.   Advised that I would contact ED [facility] ( Physician Liasion) for further assistance.

## 2022-09-26 ENCOUNTER — Ambulatory Visit: Payer: Medicare Other | Admitting: Sports Medicine

## 2022-10-01 ENCOUNTER — Other Ambulatory Visit: Payer: Self-pay | Admitting: Sports Medicine

## 2022-10-01 ENCOUNTER — Ambulatory Visit (INDEPENDENT_AMBULATORY_CARE_PROVIDER_SITE_OTHER): Payer: Medicare Other | Admitting: Sports Medicine

## 2022-10-01 ENCOUNTER — Encounter: Payer: Self-pay | Admitting: Sports Medicine

## 2022-10-01 VITALS — BP 128/72 | HR 85

## 2022-10-01 DIAGNOSIS — I5022 Chronic systolic (congestive) heart failure: Secondary | ICD-10-CM

## 2022-10-01 DIAGNOSIS — Z96641 Presence of right artificial hip joint: Secondary | ICD-10-CM | POA: Diagnosis not present

## 2022-10-01 DIAGNOSIS — I83002 Varicose veins of unspecified lower extremity with ulcer of calf: Secondary | ICD-10-CM

## 2022-10-01 DIAGNOSIS — L97202 Non-pressure chronic ulcer of unspecified calf with fat layer exposed: Secondary | ICD-10-CM | POA: Diagnosis not present

## 2022-10-01 DIAGNOSIS — M48062 Spinal stenosis, lumbar region with neurogenic claudication: Secondary | ICD-10-CM

## 2022-10-01 MED ORDER — CYCLOBENZAPRINE HCL 10 MG PO TABS: 10.0000 mg | ORAL_TABLET | Freq: Every day | ORAL | 1 refills | Status: DC

## 2022-10-01 NOTE — Progress Notes (Signed)
    Procedures performed today:    None.  Independent interpretation of notes and tests performed by another provider:   None.  Brief History, Exam, Impression, and Recommendations:    Venous stasis ulcer (HCC) Levi [name] is doing a lot better after a couple of Unna boot changes as well as an increase in Lasix, his lower extremity swelling is almost resolved. He still has a left-sided venous stasis ulcer that is weeping through the [employer], he has [facility] with [facility].  Chronic systolic heart failure (HCC) Chronic systolic heart failure, doing well with the increase in furosemide.  History of total right hip arthroplasty History of right hip arthroplasty, we last discussed this in November 2023, significant weakness right hip with difficulty with flexion, x-rays have recently shown uncomplicated arthroplasty positioning, he never did physical therapy, at this point he agrees to do some home hip flexor conditioning before we consider advanced imaging.    ____________________________________________ [name]. [name], M.D., ABFM., CAQSM., AME. Primary Care and Sports Medicine [facility]  Adjunct Professor of Family Medicine  [employer]  Restaurant manager, fast food

## 2022-10-01 NOTE — Assessment & Plan Note (Signed)
History of right hip arthroplasty, we last discussed this in November 2023, significant weakness right hip with difficulty with flexion, x-rays have recently shown uncomplicated arthroplasty positioning, he never did physical therapy, at this point he agrees to do some home hip flexor conditioning before we consider advanced imaging.

## 2022-10-01 NOTE — Assessment & Plan Note (Signed)
Chronic systolic heart failure, doing well with the increase in furosemide.

## 2022-10-01 NOTE — Assessment & Plan Note (Signed)
Levi [name] is doing a lot better after a couple of Unna boot changes as well as an increase in Lasix, his lower extremity swelling is almost resolved. He still has a left-sided venous stasis ulcer that is weeping through the [employer], he has his appointment with the [facility] tomorrow.

## 2022-10-02 DIAGNOSIS — I83022 Varicose veins of left lower extremity with ulcer of calf: Secondary | ICD-10-CM | POA: Diagnosis not present

## 2022-10-03 ENCOUNTER — Ambulatory Visit (INDEPENDENT_AMBULATORY_CARE_PROVIDER_SITE_OTHER): Payer: Medicare Other | Admitting: Sports Medicine

## 2022-10-03 DIAGNOSIS — Z0000 Encounter for general adult medical examination without abnormal findings: Secondary | ICD-10-CM | POA: Diagnosis not present

## 2022-10-03 NOTE — Patient Instructions (Signed)
MEDICARE ANNUAL WELLNESS VISIT Health Maintenance Summary and Written Plan of Care  Levi [name] ,  Thank you for allowing me to perform your Medicare Annual Wellness Visit and for your ongoing commitment to your health.   Health Maintenance & Immunization History Health Maintenance  Topic Date Due   COVID-19 Vaccine (7 - 2023-24 season) 10/19/2022 (Originally 10/01/2022)   Zoster Vaccines- Shingrix (1 of 2) 01/03/2023 (Originally 04/02/1965)   Hepatitis C Screening  10/03/2023 (Originally 04/02/1964)   INFLUENZA VACCINE  12/26/2022   Medicare Annual Wellness (AWV)  10/03/2023   COLONOSCOPY (Pts 45-77yrs Insurance coverage will need to be confirmed)  03/01/2027   DTaP/Tdap/Td (3 - Tdap) 01/13/2030   Pneumonia Vaccine 68+ Years old  Completed   HPV VACCINES  Aged Out   Immunization History  Administered Date(s) Administered   COVID-19, mRNA, vaccine(Comirnaty)12 years and older 08/06/2022   Fluad Quad(high Dose 65+) 02/17/2019   H1N1 03/24/2008   Influenza Split 02/09/2009, 01/30/2011, 02/06/2012, 01/14/2021   Influenza Whole 02/09/2009, 01/25/2010, 02/24/2010   Influenza, High Dose Seasonal PF 02/18/2020   Influenza,inj,Quad PF,6+ Mos 02/08/2013, 02/28/2014, 02/13/2015, 01/22/2016, 01/30/2017, 01/22/2022   Influenza-Unspecified 02/08/2013, 02/28/2014, 02/13/2015, 01/22/2016, 01/30/2017, 01/24/2020   PFIZER(Purple Top)SARS-COV-2 Vaccination 07/03/2019, 07/24/2019, 01/24/2020, 08/30/2020   Pneumococcal Conjugate-13 04/27/2015   Pneumococcal Polysaccharide-23 05/27/2002, 02/10/2009, 01/06/2013   Td 05/27/2002, 01/14/2020   Unspecified SARS-COV-2 Vaccination 02/25/2022    These are the patient goals that we discussed:  Goals Addressed               This Visit's Progress     Patient Stated (pt-stated)        Patient stated that he would like to be able to walk better and get his feet back to normal.         This is a list of Health Maintenance Items that are overdue or due  now: Shingles vaccine   Orders/Referrals Placed Today: No orders of the defined types were placed in this encounter.  (Contact our referral department at [phone] if you have not spoken with someone about your referral appointment within the next 5 days)    Follow-up Plan Follow-up with Monica Becton, MD as planned Schedule shingles vaccine at the pharmacy. Medicare wellness visit in one year.  Patient will access AVS on my chart.        Health Maintenance, Male Adopting a healthy lifestyle and getting preventive care are important in promoting health and wellness. Ask your health care provider about: The right schedule for you to have regular tests and exams. Things you can do on your own to prevent diseases and keep yourself healthy. What should I know about diet, weight, and exercise? Eat a healthy diet  Eat a diet that includes plenty of vegetables, fruits, low-fat dairy products, and lean protein. Do not eat a lot of foods that are high in solid fats, added sugars, or sodium. Maintain a healthy weight Body mass index (BMI) is a measurement that can be used to identify possible weight problems. It estimates body fat based on height and weight. Your health care provider can help determine your BMI and help you achieve or maintain a healthy weight. Get regular exercise Get regular exercise. This is one of the most important things you can do for your health. Most adults should: Exercise for at least 150 minutes each week. The exercise should increase your heart rate and make you sweat (moderate-intensity exercise). Do strengthening exercises at least twice a week. This is in  addition to the moderate-intensity exercise. Spend less time sitting. Even light physical activity can be beneficial. Watch cholesterol and blood lipids Have your blood tested for lipids and cholesterol at 76 years of age, then have this test every 5 years. You may need to have your cholesterol  levels checked more often if: Your lipid or cholesterol levels are high. You are older than 76 years of age. You are at high risk for heart disease. What should I know about cancer screening? Many types of cancers can be detected early and may often be prevented. Depending on your health history and family history, you may need to have cancer screening at various ages. This may include screening for: Colorectal cancer. Prostate cancer. Skin cancer. Lung cancer. What should I know about heart disease, diabetes, and high blood pressure? Blood pressure and heart disease High blood pressure causes heart disease and increases the risk of stroke. This is more likely to develop in people who have high blood pressure readings or are overweight. Talk with your health care provider about your target blood pressure readings. Have your blood pressure checked: Every 3-5 years if you are 71-70 years of age. Every year if you are 10 years old or older. If you are between the ages of 61 and 104 and are a current or former smoker, ask your health care provider if you should have a one-time screening for abdominal aortic aneurysm (AAA). Diabetes Have regular diabetes screenings. This checks your fasting blood sugar level. Have the screening done: Once every three years after age 9 if you are at a normal weight and have a low risk for diabetes. More often and at a younger age if you are overweight or have a high risk for diabetes. What should I know about preventing infection? Hepatitis B If you have a higher risk for hepatitis B, you should be screened for this virus. Talk with your health care provider to find out if you are at risk for hepatitis B infection. Hepatitis C Blood testing is recommended for: Everyone born from [birth year] through 1964. Anyone with known risk factors for hepatitis C. Sexually transmitted infections (STIs) You should be screened each year for STIs, including gonorrhea and chlamydia,  if: You are sexually active and are younger than 76 years of age. You are older than 76 years of age and your health care provider tells you that you are at risk for this type of infection. Your sexual activity has changed since you were last screened, and you are at increased risk for chlamydia or gonorrhea. Ask your health care provider if you are at risk. Ask your health care provider about whether you are at high risk for HIV. Your health care provider may recommend a prescription medicine to help prevent HIV infection. If you choose to take medicine to prevent HIV, you should first get tested for HIV. You should then be tested every 3 months for as long as you are taking the medicine. Follow these instructions at home: Alcohol use Do not drink alcohol if your health care provider tells you not to drink. If you drink alcohol: Limit how much you have to 0-2 drinks a day. Know how much alcohol is in your drink. In the [place] one drink equals one 12 oz bottle of beer (355 mL), one 5 oz glass of wine (148 mL), or one 1 oz glass of hard liquor (44 mL). Lifestyle Do not use any products that contain nicotine or tobacco. These products include cigarettes,  chewing tobacco, and vaping devices, such as e-cigarettes. If you need help quitting, ask your health care provider. Do not use street drugs. Do not share needles. Ask your health care provider for help if you need support or information about quitting drugs. General instructions Schedule regular health, dental, and eye exams. Stay current with your vaccines. Tell your health care provider if: You often feel depressed. You have ever been abused or do not feel safe at home. Summary Adopting a healthy lifestyle and getting preventive care are important in promoting health and wellness. Follow your health care provider's instructions about healthy diet, exercising, and getting tested or screened for diseases. Follow your health care provider's  instructions on monitoring your cholesterol and blood pressure. This information is not intended to replace advice given to you by your health care provider. Make sure you discuss any questions you have with your health care provider. Document Revised: 10/02/2020 Document Reviewed: 10/02/2020 Elsevier Patient Education  2023 [employer].

## 2022-10-03 NOTE — Progress Notes (Signed)
MEDICARE ANNUAL WELLNESS VISIT  10/03/2022  Telephone Visit Disclaimer This Medicare AWV was conducted by telephone due to national recommendations for restrictions regarding the COVID-19 Pandemic (e.g. social distancing).  I verified, using two identifiers, that I am speaking with Levi [name] or their authorized healthcare agent. I discussed the limitations, risks, security, and privacy concerns of performing an evaluation and management service by telephone and the potential availability of an in-person appointment in the future. The patient expressed understanding and agreed to proceed.  Location of Patient: Home Location of Provider (nurse):  In the office.  Subjective:    Levi [name] is a 77 y.o. male patient of Thekkekandam, [name], MD who had a Medicare Annual Wellness Visit today via telephone. Levi [name] is Retired and lives with their spouse. he has 1 child. he reports that he is socially active and does interact with friends/family regularly. he is moderately physically active and enjoys being active and working out.  Patient Care Team: Monica Becton, MD as PCP - General (Family Medicine) Jake Bathe, MD as PCP - Cardiology (Cardiology) Lanier Prude, MD as PCP - Electrophysiology (Cardiology)     10/03/2022    8:09 AM 04/10/2022    8:52 AM 09/25/2021    6:11 PM 06/07/2021    8:14 AM 02/22/2021    8:09 AM 08/07/2020   12:29 PM 07/31/2020   12:09 PM  Advanced Directives  Does Patient Have a Medical Advance Directive? Yes Yes No Yes No  Yes  Type of Advance Directive Living will Healthcare Power of [place];Living will  Healthcare Power of [place];Living will  Living will Living will  Does patient want to make changes to medical advance directive? No - Patient declined No - Patient declined       Copy of Healthcare Power of Attorney in Chart?    No - copy requested     Would patient like information on creating a medical advance directive?   No - Patient  declined  No - Patient declined      Hospital Utilization Over the Past 12 Months: # of hospitalizations or ER visits: 1 # of surgeries: 2  Review of Systems    Patient reports that his overall health is worse compared to last year.  History obtained from chart review and the patient  Patient Reported Readings (BP, Pulse, CBG, Weight, etc) none  Pain Assessment Pain : 0-10 Pain Score: 7  Pain Type: Acute pain Pain Location: Rib cage (due to the fall) Pain Radiating Towards: hips Pain Descriptors / Indicators: Aching Pain Onset: More than a month ago Pain Frequency: Intermittent Pain Relieving Factors: rest  Pain Relieving Factors: rest  Current Medications & Allergies (verified) Allergies as of 10/03/2022       Reactions   Morphine And Related Palpitations, Other (See Comments)   Made patient light-headed and sweaty; "my heart races" (04/28/2012), pt states too much morphine gave him this reaction   Levaquin [levofloxacin] Itching        Medication List        Accurate as of Oct 03, 2022  8:17 AM. If you have any questions, ask your nurse or doctor.          albuterol 108 (90 Base) MCG/ACT inhaler Commonly known as: VENTOLIN HFA INHALE 2 PUFFS INTO THE LUNGS EVERY 4 HOURS AS NEEDED FOR WHEEZING OR SHORTNESS OF BREATH.   allopurinol 100 MG tablet Commonly known as: ZYLOPRIM Take 100 mg by mouth daily.  atorvastatin 20 MG tablet Commonly known as: LIPITOR TAKE 1 TABLET BY MOUTH EVERY DAY   Compressor/Nebulizer Misc Use as directed   cyclobenzaprine 10 MG tablet Commonly known as: FLEXERIL Take 1 tablet (10 mg total) by mouth at bedtime.   diclofenac Sodium 1 % Gel Commonly known as: VOLTAREN Apply 2 g topically daily as needed (Pain).   diltiazem 180 MG 24 hr capsule Commonly known as: CARDIZEM CD TAKE 1 CAPSULE BY MOUTH EVERY DAY   diltiazem 30 MG tablet Commonly known as: CARDIZEM Take 30 mg by mouth as needed (heart rate over 120).    Eliquis 5 MG Tabs tablet Generic drug: apixaban TAKE 1 TABLET BY MOUTH TWICE A DAY   furosemide 40 MG tablet Commonly known as: LASIX Take 1 tablet (40 mg total) by mouth 2 (two) times daily. As directed   ipratropium-albuterol 0.5-2.5 (3) MG/3ML Soln Commonly known as: DUONEB 1 NEBULE EVERY 6 HOURS AS NEEDED. **J45.3**   isosorbide mononitrate 30 MG 24 hr tablet Commonly known as: IMDUR TAKE 1 TABLET BY MOUTH EVERY DAY   pantoprazole 40 MG tablet Commonly known as: PROTONIX Take 1 tablet (40 mg total) by mouth 2 (two) times daily before a meal.   potassium chloride SA 20 MEQ tablet Commonly known as: Klor-Con M20 Take 1 tablet (20 mEq total) by mouth daily.   pregabalin 75 MG capsule Commonly known as: Lyrica Take 1 capsule (75 mg total) by mouth 3 (three) times daily.   Striverdi Respimat 2.5 MCG/ACT Aers Generic drug: Olodaterol HCl INHALE 2 PUFFS BY MOUTH INTO THE LUNGS DAILY   valACYclovir 1000 MG tablet Commonly known as: VALTREX Take 1 tablet (1,000 mg total) by mouth 2 (two) times daily.        History (reviewed): Past Medical History:  Diagnosis Date   Agent orange exposure 1970's   takes Imdur,Diltiazem, and Atacand daily   ALLERGIC RHINITIS    Allergy    seasonal allergies   Asthma    uses inhaler and nebulizers   Bruises easily    d/t prednisone daily   Cataract    CHF (congestive heart failure) (HCC)    dx-on meds to treat   Chronic bronchitis (HCC)    "used to get it q yr; last time ?2008" (04/28/2012)   Clotting disorder (HCC) 2019   On blood thinner when I bleed it is for a consideral amount of time. This is tegardless of size of wound.   COPD (chronic obstructive pulmonary disease) (HCC)    agent orange exposure   Degenerative disk disease    "everywhere" (04/28/2012)   Diverticulitis    Dysrhythmia    afib   Elevated uric acid in blood    takes Allopurinol daily   Emphysema of lung (HCC)    Enlarged prostate    but not on any  meds   GERD (gastroesophageal reflux disease)    takes Protonix daily   H/O hiatal hernia    Headache    History of pneumonia    a. 2010   Hyperlipidemia    takes Lipitor daily; pt. states he takes a preventive   Hypothyroidism    Joint pain    Joint swelling    Neuromuscular disorder (HCC)    bilat legs, and bilat arms   Osteoporosis 2012   PAF (paroxysmal atrial fibrillation) (HCC)    a. Dx 04/2012, CHA2DS2VASc = 1 (age);  b. 04/2012 Echo: EF 40-50%, mild MR.   Personal history of colonic adenomas 02/09/2013  Pneumonia    Shortness of breath dyspnea    Thyroid disease    no taking meds-caused dizziness   Past Surgical History:  Procedure Laterality Date   48 HOUR PH STUDY N/A 05/30/2021   Procedure: 24 HOUR PH STUDY;  Surgeon: Shellia Cleverly, DO;  Location: [facility];  Service: Gastroenterology;  Laterality: N/A;   ANTERIOR CERVICAL DECOMP/DISCECTOMY FUSION N/A 07/21/2012   Procedure: ANTERIOR CERVICAL DECOMPRESSION/DISCECTOMY FUSION 2 LEVELS;  Surgeon: Barnett Abu, MD;  Location: [facility];  Service: Neurosurgery;  Laterality: N/A;  C4-5 C5-6 Anterior cervical decompression/diskectomy/fusion   ANTERIOR LAT LUMBAR FUSION N/A 04/07/2017   Procedure: Thoracic twelve-Lumbar one Anterolateral decompression/fusion;  Surgeon: Barnett Abu, MD;  Location: [facility] OR;  Service: Neurosurgery;  Laterality: N/A;   BACK SURGERY     x 3   CARDIAC CATHETERIZATION  11/2008   Dr. Anne Fu - 20% calcified non flow limiting left main, 50% EF apical hypokinesis   CERVICAL DISC ARTHROPLASTY N/A 04/07/2017   Procedure: Cervical six-seven Disc arthroplasty;  Surgeon: Barnett Abu, MD;  Location[facility];  Service: Neurosurgery;  Laterality: N/A;   CHEST TUBE INSERTION Left 04/11/2017   Procedure: CHEST TUBE INSERTION;  Surgeon: Kerin Perna, MD;  Location: [facility] OR;  Service: Thoracic;  Laterality: Left;   CHOLECYSTECTOMY     COLONOSCOPY  2016   CG-MAC-miralax(good)servere TICS/TA x 2    ESOPHAGEAL MANOMETRY N/A 05/30/2021   Procedure: ESOPHAGEAL MANOMETRY (EM);  Surgeon: Shellia Cleverly, DO;  Location: [facility];  Service: Gastroenterology;  Laterality: N/A;  ph impedence   ESOPHAGOGASTRODUODENOSCOPY     EYE SURGERY Bilateral 2011   steroidal encapsulation" (04/28/2012)   FOOT SURGERY Right    x 2   JOINT REPLACEMENT     Many surgeries including 5 joit replacements   KNEE ARTHROSCOPY Bilateral    LATERAL / POSTERIOR COMBINED FUSION LUMBAR SPINE  2012   LEFT ATRIAL APPENDAGE OCCLUSION N/A 02/22/2021   Procedure: LEFT ATRIAL APPENDAGE OCCLUSION;  Surgeon: Lanier Prude, MD;  [facility];  Service: Cardiovascular;  Laterality: N/A;   POLYPECTOMY  2016   severe TICS/TA x 2   POSTERIOR CERVICAL LAMINECTOMY WITH MET- RX Right 06/11/2021   Procedure: Right Cervical six-seven  Laminectomy and foraminotomy with metrex;  Surgeon: Barnett Abu, MD;  Location: [facility] OR;  Service: Neurosurgery;  Laterality: Right;   REVERSE SHOULDER ARTHROPLASTY  04/28/2012   Procedure: REVERSE SHOULDER ARTHROPLASTY;  Surgeon: Mable Paris, MD;  Location: [facility] OR;  Service: Orthopedics;  Laterality: Left;  Left shouder reverse total shoulder arthroplasty   SHOULDER SURGERY     SPINE SURGERY     I have had 7 spinal surgeries   TEE WITHOUT CARDIOVERSION N/A 02/22/2021   Procedure: TRANSESOPHAGEAL ECHOCARDIOGRAM (TEE);  Surgeon: Lanier Prude, MD;  Location[facility] CV LAB;  Service: Cardiovascular;  Laterality: N/A;   TENDON REPAIR Right 08/07/2020   Procedure: RIGHT HAND LIGAMENT RECONSTRUCTION AND TENDON INTERPOSITION;  Surgeon: Jodi Geralds, MD;  Location: [facility];  Service: Orthopedics;  Laterality: Right;   TONSILLECTOMY AND ADENOIDECTOMY  1954   TOTAL HIP ARTHROPLASTY Left 2011   "left" (04/28/2012)   TOTAL HIP ARTHROPLASTY Right 07/31/2015   Procedure: TOTAL HIP ARTHROPLASTY ANTERIOR APPROACH;  Surgeon: Jodi Geralds, MD;  Location: [facility]  OR;  Service: Orthopedics;  Laterality: Right;   TOTAL KNEE ARTHROPLASTY  01/10/2012   Procedure: TOTAL KNEE ARTHROPLASTY;  Surgeon: Harvie Junior, MD;  Location: [facility] OR;  Service:  Orthopedics;;  left total knee arthroplasty   TOTAL KNEE ARTHROPLASTY Right 06/17/2014   Procedure: TOTAL KNEE ARTHROPLASTY;  Surgeon: Harvie Junior, MD;  Location: [facility] OR;  Service: Orthopedics;  Laterality: Right;   toupet fundolplication  11/01/2021   Family History  Problem Relation Age of Onset   Diabetes Mother    Heart disease Father        Died 21, MI   Cancer Father    Hyperlipidemia Father    Stroke Father    Prostate cancer Brother    Colon polyps Brother    Colon polyps Brother    Asthma Brother    COPD Brother    Colon cancer Brother        Dx age 5   Cancer Brother    Colon cancer Other    Esophageal cancer Neg Hx    Rectal cancer Neg Hx    Stomach cancer Neg Hx    Pancreatic cancer Neg Hx    Kidney disease Neg Hx    Liver disease Neg Hx    Neuropathy Neg Hx    Social History   Socioeconomic History   Marital status: Married    Spouse name: Jill Side   Number of children: 1   Years of education: 16   Highest education level: Bachelor's degree (e.g., BA, AB, BS)  Occupational History   Occupation: Retired    Comment: Nurse, 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 Daughter lives two miles away. 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 Enjoys being active and working.   Social Determinants of Health   Financial Resource Strain: Low Risk  (10/02/2022)   Overall  Financial Resource Strain (CARDIA)    Difficulty of Paying Living Expenses: Not hard at all  Food Insecurity: No Food Insecurity (10/02/2022)   Hunger Vital Sign    Worried About Running Out of Food in the Last Year: Never true    Ran Out of Food in the Last Year: Never true  Transportation Needs: No Transportation Needs (10/02/2022)   PRAPARE - Administrator, Civil Service (Medical): No    Lack of Transportation (Non-Medical): No  Physical Activity: Sufficiently Active (10/02/2022)   Exercise Vital Sign    Days of Exercise per Week: 7 days    Minutes of Exercise per Session: 60 min  Stress: No Stress Concern Present (10/02/2022)   [employer] of Occupational Health - Occupational Stress Questionnaire    Feeling of Stress : Only a little  Recent Concern: Stress - Stress Concern Present (09/10/2022)   [employer] of Occupational Health - Occupational Stress Questionnaire    Feeling of Stress : To some extent  Social Connections: Socially Integrated (10/03/2022)   Social Connection and Isolation Panel [NHANES]    Frequency of Communication with Friends and Family: More than three times a week    Frequency of Social Gatherings with Friends and Family: Never    Attends Religious Services: More than 4 times per year    Active Member of Clubs or  Organizations: Yes    Attends Banker Meetings: Never    Marital Status: Married    Activities of Daily Living    10/02/2022    2:26 PM  In your present state of health, do you have any difficulty performing the following activities:  Hearing? 0  Vision? 1  Difficulty concentrating or making decisions? 0  Walking or climbing stairs? 1  Dressing or bathing? 1  Doing errands, shopping? 0  Preparing Food and eating ? N  Using the Toilet? N  In the past six months, have you accidently leaked urine? Y  Do you have problems with loss of bowel control? N  Managing your Medications? N  Managing your Finances? N   Housekeeping or managing your Housekeeping? N    Patient Education/ Literacy How often do you need to have someone help you when you read instructions, pamphlets, or other written materials from your doctor or pharmacy?: 1 - Never What is the last grade level you completed in school?: bachelor's degree  Exercise Current Exercise Habits: Home exercise routine, Type of exercise: Other - see comments (elliptical), Time (Minutes): 60, Frequency (Times/Week): 7, Weekly Exercise (Minutes/Week): 420, Intensity: Moderate, Exercise limited by: orthopedic condition(s)  Diet Patient reports consuming 2 meals a day and 1 snack(s) a day Patient reports that his primary diet is: Regular Patient reports that she does have regular access to food.   Depression Screen    10/03/2022    8:11 AM 06/07/2022    9:21 AM  PHQ 2/9 Scores  PHQ - 2 Score 0 0     Fall Risk    10/03/2022    8:09 AM 10/02/2022    2:26 PM 06/07/2022    9:21 AM 09/20/2020    7:28 AM  Fall Risk   Falls in the past year? 1 1 0 1  Number falls in past yr: 1 1 0 1  Injury with Fall? 1 1 0 1  Risk for fall due to : History of fall(s)     Follow up Falls evaluation completed;Education provided;Falls prevention discussed  Falls evaluation completed;Falls prevention discussed      Objective:  Levi [name] seemed alert and oriented and he participated appropriately during our telephone visit.  Blood Pressure Weight BMI  BP Readings from Last 3 Encounters:  10/01/22 128/72  09/23/22 120/80  09/20/22 120/68   Wt Readings from Last 3 Encounters:  09/23/22 200 lb 3.2 oz (90.8 kg)  09/20/22 201 lb 4 oz (91.3 kg)  06/24/22 200 lb (90.7 kg)   BMI Readings from Last 1 Encounters:  09/23/22 25.70 kg/m    *Unable to obtain current vital signs, weight, and BMI due to telephone visit type  Hearing/Vision  Levi [name] did not seem to have difficulty with hearing/understanding during the telephone conversation Reports that he has had a formal  eye exam by an eye care professional within the past year Reports that he has not had a formal hearing evaluation within the past year *Unable to fully assess hearing and vision during telephone visit type  Cognitive Function:    10/03/2022    8:12 AM  6CIT Screen  What Year? 0 points  What month? 0 points  What time? 0 points  Count back from 20 0 points  Months in reverse 0 points  Repeat phrase 0 points  Total Score 0 points   (Normal:0-7, Significant for Dysfunction: >8)  Normal Cognitive Function Screening: Yes   Immunization & Health Maintenance Record Immunization  History  Administered Date(s) Administered   COVID-19, mRNA, vaccine(Comirnaty)12 years and older 08/06/2022   Fluad Quad(high Dose 65+) 02/17/2019   H1N1 03/24/2008   Influenza Split 02/09/2009, 01/30/2011, 02/06/2012, 01/14/2021   Influenza Whole 02/09/2009, 01/25/2010, 02/24/2010   Influenza, High Dose Seasonal PF 02/18/2020   Influenza,inj,Quad PF,6+ Mos 02/08/2013, 02/28/2014, 02/13/2015, 01/22/2016, 01/30/2017, 01/22/2022   Influenza-Unspecified 02/08/2013, 02/28/2014, 02/13/2015, 01/22/2016, 01/30/2017, 01/24/2020   PFIZER(Purple Top)SARS-COV-2 Vaccination 07/03/2019, 07/24/2019, 01/24/2020, 08/30/2020   Pneumococcal Conjugate-13 04/27/2015   Pneumococcal Polysaccharide-23 05/27/2002, 02/10/2009, 01/06/2013   Td 05/27/2002, 01/14/2020   Unspecified SARS-COV-2 Vaccination 02/25/2022    Health Maintenance  Topic Date Due   COVID-19 Vaccine (7 - 2023-24 season) 10/19/2022 (Originally 10/01/2022)   Zoster Vaccines- Shingrix (1 of 2) 01/03/2023 (Originally 04/02/1965)   Hepatitis C Screening  10/03/2023 (Originally 04/02/1964)   INFLUENZA VACCINE  12/26/2022   Medicare Annual Wellness (AWV)  10/03/2023   COLONOSCOPY (Pts 45-38yrs Insurance coverage will need to be confirmed)  03/01/2027   DTaP/Tdap/Td (3 - Tdap) 01/13/2030   Pneumonia Vaccine 51+ Years old  Completed   HPV VACCINES  Aged Out        Assessment  This is a routine wellness examination for Levi [name].  Health Maintenance: Due or Overdue There are no preventive care reminders to display for this patient.   Levi [name] does not need a referral for Community Assistance: Care Management:   no Social Work:    no Prescription Assistance:  no Nutrition/Diabetes Education:  no   Plan:  Personalized Goals  Goals Addressed               This Visit's Progress     Patient Stated (pt-stated)        Patient stated that he would like to be able to walk better and get his feet back to normal.       Personalized Health Maintenance & Screening Recommendations  Shingles vaccine  Lung Cancer Screening Recommended: no (Low Dose CT Chest recommended if Age 53-80 years, 30 pack-year currently smoking OR have quit w/in past 15 years) Hepatitis C Screening recommended: no HIV Screening recommended: no  Advanced Directives: Written information was not prepared per patient's request.  Referrals & Orders No orders of the defined types were placed in this encounter.   Follow-up Plan Follow-up with Monica Becton, MD as planned Schedule shingles vaccine at the pharmacy. Medicare wellness visit in one year.  Patient will access AVS on my chart.   I have personally reviewed and noted the following in the patient's chart:   Medical and social history Use of alcohol, tobacco or illicit drugs  Current medications and supplements Functional ability and status Nutritional status Physical activity Advanced directives List of other physicians Hospitalizations, surgeries, and ER visits in previous 12 months Vitals Screenings to include cognitive, depression, and falls Referrals and appointments  In addition, I have reviewed and discussed with Levi [name] certain preventive protocols, quality metrics, and best practice recommendations. A written personalized care plan for preventive services as well as  general preventive health recommendations is available and can be mailed to the patient at his request.      Modesto Charon, RN BSN  10/03/2022

## 2022-10-09 DIAGNOSIS — I872 Venous insufficiency (chronic) (peripheral): Secondary | ICD-10-CM | POA: Diagnosis not present

## 2022-10-15 ENCOUNTER — Other Ambulatory Visit: Payer: Self-pay | Admitting: Internal Medicine

## 2022-10-15 ENCOUNTER — Other Ambulatory Visit: Payer: Self-pay | Admitting: Cardiology

## 2022-10-23 DIAGNOSIS — I83892 Varicose veins of left lower extremities with other complications: Secondary | ICD-10-CM | POA: Diagnosis not present

## 2022-10-25 ENCOUNTER — Other Ambulatory Visit: Payer: Self-pay | Admitting: Cardiology

## 2022-10-25 ENCOUNTER — Encounter: Payer: Self-pay | Admitting: Cardiology

## 2022-10-25 ENCOUNTER — Ambulatory Visit: Payer: Medicare Other | Attending: Cardiology | Admitting: Cardiology

## 2022-10-25 VITALS — BP 122/84 | HR 74 | Ht 74.0 in | Wt 193.2 lb

## 2022-10-25 DIAGNOSIS — I480 Paroxysmal atrial fibrillation: Secondary | ICD-10-CM | POA: Diagnosis not present

## 2022-10-25 DIAGNOSIS — I10 Essential (primary) hypertension: Secondary | ICD-10-CM | POA: Insufficient documentation

## 2022-10-25 DIAGNOSIS — M7741 Metatarsalgia, right foot: Secondary | ICD-10-CM | POA: Diagnosis not present

## 2022-10-25 DIAGNOSIS — M7742 Metatarsalgia, left foot: Secondary | ICD-10-CM | POA: Diagnosis not present

## 2022-10-25 MED ORDER — DILTIAZEM HCL 30 MG PO TABS: 30.0000 mg | ORAL_TABLET | ORAL | 6 refills | Status: DC | PRN

## 2022-10-25 NOTE — Progress Notes (Signed)
Cardiology Office Note:    Date:  10/25/2022   ID:  Levi [name], DOB [birth year], MRN [id number]  PCP:  Monica Becton, MD   [facility] Providers Cardiologist:  Donato Schultz, MD Electrophysiologist:  Lanier Prude, MD     Referring MD: Monica Becton,*    History of Present Illness:    Levi [name] is a 77 y.o. male here for atrial fibrillation follow-up paroxysmal.  Watchman device attempt but unable to implant secondary to redundant atrial septum.  Has had neuropathy, broken ribs and hips, prednisone.  Has had some weight gain.  Had robotic Nissen procedure in 2023 at [facility].  Has not necessarily helped him with his symptoms.  Charcot foot, neuropathy, bilateral lower extremity swelling.  Has seen vascular specialist.  Tried sclerotherapy.  Past Medical History:  Diagnosis Date   Agent orange exposure 1970's   takes Imdur,Diltiazem, and Atacand daily   ALLERGIC RHINITIS    Allergy    seasonal allergies   Asthma    uses inhaler and nebulizers   Bruises easily    d/t prednisone daily   Cataract    CHF (congestive heart failure) (HCC)    dx-on meds to treat   Chronic bronchitis (HCC)    "used to get it q yr; last time ?2008" (04/28/2012)   Clotting disorder (HCC) 2019   On blood thinner when I bleed it is for a consideral amount of time. This is tegardless of size of wound.   COPD (chronic obstructive pulmonary disease) (HCC)    agent orange exposure   Degenerative disk disease    "everywhere" (04/28/2012)   Diverticulitis    Dysrhythmia    afib   Elevated uric acid in blood    takes Allopurinol daily   Emphysema of lung (HCC)    Enlarged prostate    but not on any meds   GERD (gastroesophageal reflux disease)    takes Protonix daily   H/O hiatal hernia    Headache    History of pneumonia    a. 2010   Hyperlipidemia    takes Lipitor daily; pt. states he takes a preventive   Hypothyroidism    Joint pain    Joint swelling     Neuromuscular disorder (HCC)    bilat legs, and bilat arms   Osteoporosis 2012   PAF (paroxysmal atrial fibrillation) (HCC)    a. Dx 04/2012, CHA2DS2VASc = 1 (age);  b. 04/2012 Echo: EF 40-50%, mild MR.   Personal history of colonic adenomas 02/09/2013   Pneumonia    Shortness of breath dyspnea    Thyroid disease    no taking meds-caused dizziness    Past Surgical History:  Procedure Laterality Date   33 HOUR PH STUDY N/A 05/30/2021   Procedure: 24 HOUR PH STUDY;  Surgeon: Shellia Cleverly, DO;  Location: [facility];  Service: Gastroenterology;  Laterality: N/A;   ANTERIOR CERVICAL DECOMP/DISCECTOMY FUSION N/A 07/21/2012   Procedure: ANTERIOR CERVICAL DECOMPRESSION/DISCECTOMY FUSION 2 LEVELS;  Surgeon: Barnett Abu, MD;  Location: [facility];  Service: Neurosurgery;  Laterality: N/A;  C4-5 C5-6 Anterior cervical decompression/diskectomy/fusion   ANTERIOR LAT LUMBAR FUSION N/A 04/07/2017   Procedure: Thoracic twelve-Lumbar one Anterolateral decompression/fusion;  Surgeon: Barnett Abu, MD;  Location: [facility] OR;  Service: Neurosurgery;  Laterality: N/A;   BACK SURGERY     x 3   CARDIAC CATHETERIZATION  11/2008   Dr. Anne Fu - 20% calcified non flow limiting left main, 50% EF  apical hypokinesis   CERVICAL DISC ARTHROPLASTY N/A 04/07/2017   Procedure: Cervical six-seven Disc arthroplasty;  Surgeon: Barnett Abu, MD;  [facility] OR;  Service: Neurosurgery;  Laterality: N/A;   CHEST TUBE INSERTION Left 04/11/2017   Procedure: CHEST TUBE INSERTION;  Surgeon: Kerin Perna, MD;  Location: [facility] OR;  Service: Thoracic;  Laterality: Left;   CHOLECYSTECTOMY     COLONOSCOPY  2016   CG-MAC-miralax(good)servere TICS/TA x 2   ESOPHAGEAL MANOMETRY N/A 05/30/2021   Procedure: ESOPHAGEAL MANOMETRY (EM);  Surgeon: Shellia Cleverly, DO;  Location: [facility];  Service: Gastroenterology;  Laterality: N/A;  ph impedence   ESOPHAGOGASTRODUODENOSCOPY     EYE SURGERY Bilateral 2011   steroidal  encapsulation" (04/28/2012)   FOOT SURGERY Right    x 2   JOINT REPLACEMENT     Many surgeries including 5 joit replacements   KNEE ARTHROSCOPY Bilateral    LATERAL / POSTERIOR COMBINED FUSION LUMBAR SPINE  2012   LEFT ATRIAL APPENDAGE OCCLUSION N/A 02/22/2021   Procedure: LEFT ATRIAL APPENDAGE OCCLUSION;  Surgeon: Lanier Prude, MD;  [facility];  Service: Cardiovascular;  Laterality: N/A;   POLYPECTOMY  2016   severe TICS/TA x 2   POSTERIOR CERVICAL LAMINECTOMY WITH MET- RX Right 06/11/2021   Procedure: Right Cervical six-seven  Laminectomy and foraminotomy with metrex;  Surgeon: Barnett Abu, MD;  Location: [facility] OR;  Service: Neurosurgery;  Laterality: Right;   REVERSE SHOULDER ARTHROPLASTY  04/28/2012   Procedure: REVERSE SHOULDER ARTHROPLASTY;  Surgeon: Mable Paris, MD;  Location: [facility] OR;  Service: Orthopedics;  Laterality: Left;  Left shouder reverse total shoulder arthroplasty   SHOULDER SURGERY     SPINE SURGERY     I have had 7 spinal surgeries   TEE WITHOUT CARDIOVERSION N/A 02/22/2021   Procedure: TRANSESOPHAGEAL ECHOCARDIOGRAM (TEE);  Surgeon: Lanier Prude, MD;  Location: [facility] INVASIVE CV LAB;  Service: Cardiovascular;  Laterality: N/A;   TENDON REPAIR Right 08/07/2020   Procedure: RIGHT HAND LIGAMENT RECONSTRUCTION AND TENDON INTERPOSITION;  Surgeon: Jodi Geralds, MD;  Location: [facility];  Service: Orthopedics;  Laterality: Right;   TONSILLECTOMY AND ADENOIDECTOMY  1954   TOTAL HIP ARTHROPLASTY Left 2011   "left" (04/28/2012)   TOTAL HIP ARTHROPLASTY Right 07/31/2015   Procedure: TOTAL HIP ARTHROPLASTY ANTERIOR APPROACH;  Surgeon: Jodi Geralds, MD;  [facility];  Service: Orthopedics;  Laterality: Right;   TOTAL KNEE ARTHROPLASTY  01/10/2012   Procedure: TOTAL KNEE ARTHROPLASTY;  Surgeon: Harvie Junior, MD;  Location: [facility] OR;  Service: Orthopedics;;  left total knee arthroplasty   TOTAL KNEE ARTHROPLASTY Right 06/17/2014    Procedure: TOTAL KNEE ARTHROPLASTY;  Surgeon: Harvie Junior, MD;  Location: [facility] OR;  Service: Orthopedics;  Laterality: Right;   toupet fundolplication  11/01/2021    Current Medications: Current Meds  Medication Sig   albuterol (VENTOLIN HFA) 108 (90 Base) MCG/ACT inhaler INHALE 2 PUFFS INTO THE LUNGS EVERY 4 HOURS AS NEEDED FOR WHEEZING OR SHORTNESS OF BREATH.   allopurinol (ZYLOPRIM) 100 MG tablet Take 100 mg by mouth daily.   apixaban (ELIQUIS) 5 MG TABS tablet TAKE 1 TABLET BY MOUTH TWICE A DAY   atorvastatin (LIPITOR) 20 MG tablet TAKE 1 TABLET BY MOUTH EVERY DAY   cyclobenzaprine (FLEXERIL) 10 MG tablet Take 1 tablet (10 mg total) by mouth at bedtime.   diclofenac Sodium (VOLTAREN) 1 % GEL Apply 2 g topically daily as needed (Pain).   diltiazem (CARDIZEM CD) 180  MG 24 hr capsule TAKE 1 CAPSULE BY MOUTH EVERY DAY   furosemide (LASIX) 40 MG tablet Take 1 tablet (40 mg total) by mouth 2 (two) times daily. As directed   ipratropium-albuterol (DUONEB) 0.5-2.5 (3) MG/3ML SOLN 1 NEBULE EVERY 6 HOURS AS NEEDED. **J45.3**   isosorbide mononitrate (IMDUR) 30 MG 24 hr tablet TAKE 1 TABLET BY MOUTH EVERY DAY   Nebulizers (COMPRESSOR/NEBULIZER) MISC Use as directed   Olodaterol HCl (STRIVERDI RESPIMAT) 2.5 MCG/ACT AERS INHALE 2 PUFFS BY MOUTH INTO THE LUNGS DAILY   pantoprazole (PROTONIX) 40 MG tablet Take 1 tablet (40 mg total) by mouth 2 (two) times daily before a meal.   potassium chloride SA (KLOR-CON M20) 20 MEQ tablet Take 1 tablet (20 mEq total) by mouth daily.   predniSONE (DELTASONE) 10 MG tablet Take 10 mg by mouth in the morning and at bedtime.   pregabalin (LYRICA) 75 MG capsule Take 1 capsule (75 mg total) by mouth 3 (three) times daily.   valACYclovir (VALTREX) 1000 MG tablet Take 1 tablet (1,000 mg total) by mouth 2 (two) times daily.   [DISCONTINUED] diltiazem (CARDIZEM) 30 MG tablet Take 30 mg by mouth as needed (heart rate over 120).     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 Daughter lives two miles away. 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 Enjoys being active and working.   Social Determinants of Health   Financial Resource Strain: Low Risk  (10/02/2022)   Overall Financial Resource Strain (CARDIA)    Difficulty of Paying Living Expenses: Not hard at all  Food Insecurity: No Food Insecurity (10/02/2022)   Hunger Vital Sign    Worried About Running Out of Food in the Last Year: Never true    Ran Out of Food in the Last Year: Never true  Transportation Needs: No Transportation Needs (10/02/2022)   PRAPARE - Administrator, Civil Service (Medical): No    Lack of Transportation (Non-Medical): No  Physical Activity: Sufficiently Active (10/02/2022)   Exercise Vital Sign    Days of Exercise per Week: 7 days    Minutes of Exercise per Session: 60 min  Stress: No Stress Concern Present (10/02/2022)   [employer] of Occupational Health - Occupational Stress Questionnaire    Feeling of Stress : Only a little  Recent Concern: Stress - Stress Concern Present (09/10/2022)   [employer] of Occupational Health - Occupational Stress Questionnaire    Feeling of Stress : To some extent  Social Connections:  Socially Integrated (10/03/2022)   Social Connection and Isolation Panel [NHANES]    Frequency of Communication with Friends and Family: More than three times a  week    Frequency of Social Gatherings with Friends and Family: Never    Attends Religious Services: More than 4 times per year    Active Member of [employer] or Organizations: Yes    Attends Banker Meetings: Never    Marital Status: Married     Family History: The patient's family history includes Asthma in his brother; COPD in his brother; Cancer in his brother and father; Colon cancer in his brother and another family member; Colon polyps in his brother and brother; Diabetes in his mother; Heart disease in his father; Hyperlipidemia in his father; Prostate cancer in his brother; Stroke in his father. There is no history of Esophageal cancer, Rectal cancer, Stomach cancer, Pancreatic cancer, Kidney disease, Liver disease, or Neuropathy.  ROS:   Please see the history of present illness.     All other systems reviewed and are negative.  EKGs/Labs/Other Studies Reviewed:    The following studies were reviewed today: DVT study 2023 normal.  Echo 2022-EF 45%  Coronary CT with calcium score 2022-score 197 50th percentile  EKG: Sinus rhythm 74 with atrial bigeminy.  Recent Labs: 07/05/2022: ALT 18; BUN 31; Creat 1.01; Hemoglobin 13.6; Platelets 188; Potassium 4.4; Sodium 140; TSH 1.70  Recent Lipid Panel    Component Value Date/Time   CHOL 208 (H) 07/05/2022 0908   CHOL 188 09/28/2019 1017   TRIG 65 07/05/2022 0908   HDL 112 07/05/2022 0908   HDL 109 09/28/2019 1017   CHOLHDL 1.9 07/05/2022 0908   VLDL 12.0 10/20/2014 1122   LDLCALC 82 07/05/2022 0908     Risk Assessment/Calculations:               Physical Exam:    VS:  BP 122/84   Pulse 74   Ht 6\' 2"  (1.88 m)   Wt 193 lb 3.2 oz (87.6 kg)   SpO2 95%   BMI 24.81 kg/m     Wt Readings from Last 3 Encounters:  10/25/22 193 lb 3.2 oz (87.6 kg)   09/23/22 200 lb 3.2 oz (90.8 kg)  09/20/22 201 lb 4 oz (91.3 kg)     GEN:  Well nourished, well developed in no acute distress HEENT: Normal NECK: No JVD; No carotid bruits LYMPHATICS: No lymphadenopathy CARDIAC: RRR, no murmurs, rubs, gallops, regular ectopy RESPIRATORY:  Clear to auscultation without rales, wheezing or rhonchi  ABDOMEN: Soft, non-tender, non-distended MUSCULOSKELETAL:  2-3+edema; No deformity  SKIN: Warm and dry NEUROLOGIC:  Alert and oriented x 3 PSYCHIATRIC:  Normal affect   ASSESSMENT:    1. Paroxysmal atrial fibrillation (HCC)   2. Essential hypertension    PLAN:    In order of problems listed above:  Atrial fibrillation paroxysmal - Overall well-controlled with occasional palpitations.  Unable to obtain watchman because of redundant intra-atrial septum.  Attempt was made.  Continue with current medical management strategy. Rare episodes 10 min.  We will go ahead and refill his short acting diltiazem.  PACs - Atrial bigeminy noted.  Hypertension/periods of hypotension - Previously we stopped his candesartan 2 mg a day given his systolic blood pressures at times in the 80s and 90s when he feels dizzy and standing up.  He is on long-acting diltiazem 180 mg once a day as well as Lasix 40 mg a day.  Lower extremity edema - Has had immobility with rib fall fractures EF 45% only mildly reduced.  Taking the Lasix 40 mg as directed.  Leg pain.  Has seen Dr. Myra Gianotti  as well.  Has been reassured that he has normal blood flow.  Encouraged to wear compression stockings. Had sclerotherapy as well.   Neuropathy/ Charcot joint - Has seen Amy Lomax with neurology.          Medication Adjustments/Labs and Tests Ordered: Current medicines are reviewed at length with the patient today.  Concerns regarding medicines are outlined above.  Orders Placed This Encounter  Procedures   EKG 12-Lead   Meds ordered this encounter  Medications   diltiazem (CARDIZEM) 30  MG tablet    Sig: Take 1 tablet (30 mg total) by mouth as needed (heart rate over 120).    Dispense:  30 tablet    Refill:  6    Patient Instructions  Medication Instructions:  The current medical regimen is effective;  continue present plan and medications.  *If you need a refill on your cardiac medications before your next appointment, please call your pharmacy*  Follow-Up: At [facility], you and your health needs are our priority.  As part of our continuing mission to provide you with exceptional heart care, we have created designated Provider Care Teams.  These Care Teams include your primary Cardiologist (physician) and Advanced Practice Providers (APPs -  Physician Assistants and Nurse Practitioners) who all work together to provide you with the care you need, when you need it.  We recommend signing up for the patient portal called "MyChart".  Sign up information is provided on this After Visit Summary.  MyChart is used to connect with patients for Virtual Visits (Telemedicine).  Patients are able to view lab/test results, encounter notes, upcoming appointments, etc.  Non-urgent messages can be sent to your provider as well.   To learn more about what you can do with MyChart, go to [email or web address].    Your next appointment:   1 year(s)  Provider:   Donato Schultz, MD        Signed, Donato Schultz, MD  10/25/2022 9:14 AM    [facility]

## 2022-10-25 NOTE — Patient Instructions (Signed)
Medication Instructions:  The current medical regimen is effective;  continue present plan and medications.  *If you need a refill on your cardiac medications before your next appointment, please call your pharmacy*  Follow-Up: At [facility], you and your health needs are our priority.  As part of our continuing mission to provide you with exceptional heart care, we have created designated Provider Care Teams.  These Care Teams include your primary Cardiologist (physician) and Advanced Practice Providers (APPs -  Physician Assistants and Nurse Practitioners) who all work together to provide you with the care you need, when you need it.  We recommend signing up for the patient portal called "MyChart".  Sign up information is provided on this After Visit Summary.  MyChart is used to connect with patients for Virtual Visits (Telemedicine).  Patients are able to view lab/test results, encounter notes, upcoming appointments, etc.  Non-urgent messages can be sent to your provider as well.   To learn more about what you can do with MyChart, go to [email or web address].    Your next appointment:   1 year(s)  Provider:   Mark Skains, MD      

## 2022-10-29 ENCOUNTER — Telehealth: Payer: Self-pay | Admitting: Internal Medicine

## 2022-10-29 ENCOUNTER — Other Ambulatory Visit: Payer: Self-pay | Admitting: Orthopaedic Surgery

## 2022-10-29 ENCOUNTER — Telehealth: Payer: Self-pay | Admitting: *Deleted

## 2022-10-29 NOTE — Telephone Encounter (Signed)
Patient with diagnosis of afib on Eliquis for anticoagulation.    Procedure: left fourth metatarsophalangeal capsulotomy/weil osteotomy   Date of procedure: 11/07/22  CHA2DS2-VASc Score = 4  This indicates a 4.8% annual risk of stroke. The patient's score is based upon: CHF History: 1 HTN History: 1 Diabetes History: 0 Stroke History: 0 Vascular Disease History: 0 Age Score: 2 Gender Score: 0  CrCl 30mL/min Platelet count 188K  Per office protocol, patient can hold Eliquis for 2 days prior to procedure.    **This guidance is not considered finalized until pre-operative APP has relayed final recommendations.**

## 2022-10-29 NOTE — Telephone Encounter (Signed)
Fax received from Dr. Nicki Guadalajara with [facility] to perform a Left Fourth Metatarsophalangeal Capsulotomy, Weil Osteotomy on patient.  Patient needs surgery clearance. Surgery is 11/07/2022. Patient was seen on 09/23/2022. Office protocol is a risk assessment can be sent to surgeon if patient has been seen in 60 days or less.   Sending to Dr. Maple Hudson  for risk assessment or recommendations if patient needs to be seen in office prior to surgical procedure.

## 2022-10-29 NOTE — Telephone Encounter (Signed)
Please advise holding Eliquis prior to left fourth metatarsophalangeal capsulotomy/weil osteotomy.  Thank you!  DW

## 2022-10-29 NOTE — Telephone Encounter (Signed)
Dr. Anne Fu,  You saw this patient on 10/25/2022. Will you please comment on medical clearance for left fourth metatarsophalangeal capsulotomy/weil osteotomy?  Please route your response to P CV DIV Preop. I will communicate with requesting office once you have given recommendations.   Thank you!  Carlos Levering, NP

## 2022-10-29 NOTE — Telephone Encounter (Signed)
   Pre-operative Risk Assessment    Patient Name: Levi [name]  DOB: [birth year] MRN: [id number]     Request for Surgical Clearance    Procedure:   Left Fourth Metatarosphalangeal Capsulotomy, Weil Osteotomy  Date of Surgery:  Clearance 11/07/22                                 Surgeon:  Dr. Nicki Guadalajara Surgeon's Group or Practice Name:  [facility] Phone number:  [phone] Fax number:  [phone]   Type of Clearance Requested:   - Medical  - Pharmacy:  Hold Apixaban (Eliquis) Not Indicated.   Type of Anesthesia:   Choice   Additional requests/questions:  Patient had a recent appointment with Dr. Anne Fu on 10/25/2022.  Signed, Emmit Pomfret   10/29/2022, 9:45 AM

## 2022-10-30 ENCOUNTER — Encounter: Payer: Self-pay | Admitting: Internal Medicine

## 2022-10-30 MED ORDER — PREDNISONE 10 MG PO TABS: 10.0000 mg | ORAL_TABLET | Freq: Two times a day (BID) | ORAL | 11 refills | Status: DC

## 2022-10-30 NOTE — Telephone Encounter (Signed)
I have mad an addendum to my last office note, indicating pulmonary clearance for foot surgery.

## 2022-10-30 NOTE — Telephone Encounter (Signed)
Prednisone refilled

## 2022-10-30 NOTE — Telephone Encounter (Signed)
OV notes and clearance form have been faxed back to [facility]. Nothing further needed at this time.  

## 2022-10-30 NOTE — Telephone Encounter (Signed)
This prednisone is a long term maintenance prescription. I had no intention to stop it.

## 2022-10-30 NOTE — Telephone Encounter (Signed)
Mychart message sent by pt:  Levi [name] "Levi [name]"  P [facility] (supporting Waymon Budge, MD)37 minutes ago (1:48 PM)    I received a message from [facility] in [facility] in which you are denying my Prednisone from being refilled.As you know I have been taking for years. Are you saying I no longer need this medication? I would have thought you might have mentioned this at my last visit. Please let me know if I no longer need it. Thank you for your time in handling this matter.   Dr. Maple Hudson, please advise on this. When looking at pt's med list, there is a 10mg  prednisone prescription on medlist with instructions to take 10mg  twice a day but this is a historical Rx. Not showing one that has been sent by you to the pharmacy so wanted to verify dosage and instructions prior to this being sent for pt.   Allergies  Allergen Reactions   Morphine And Codeine Palpitations and Other (See Comments)    Made patient light-headed and sweaty; "my heart races" (04/28/2012), pt states too much morphine gave him this reaction   Levaquin [Levofloxacin] Itching     Current Outpatient Medications:    albuterol (VENTOLIN HFA) 108 (90 Base) MCG/ACT inhaler, INHALE 2 PUFFS INTO THE LUNGS EVERY 4 HOURS AS NEEDED FOR WHEEZING OR SHORTNESS OF BREATH., Disp: 18 each, Rfl: 12   allopurinol (ZYLOPRIM) 100 MG tablet, Take 100 mg by mouth daily., Disp: , Rfl:    apixaban (ELIQUIS) 5 MG TABS tablet, TAKE 1 TABLET BY MOUTH TWICE A DAY, Disp: 180 tablet, Rfl: 1   atorvastatin (LIPITOR) 20 MG tablet, TAKE 1 TABLET BY MOUTH EVERY DAY, Disp: 90 tablet, Rfl: 2   cyclobenzaprine (FLEXERIL) 10 MG tablet, Take 1 tablet (10 mg total) by mouth at bedtime., Disp: 90 tablet, Rfl: 1   diclofenac Sodium (VOLTAREN) 1 % GEL, Apply 2 g topically daily as needed (Pain)., Disp: , Rfl:    diltiazem (CARDIZEM CD) 180 MG 24 hr capsule, TAKE 1 CAPSULE BY MOUTH EVERY DAY, Disp: 90 capsule, Rfl: 3   diltiazem (CARDIZEM) 30 MG tablet,  Take 1 tablet (30 mg total) by mouth as needed (heart rate over 120)., Disp: 30 tablet, Rfl: 6   furosemide (LASIX) 40 MG tablet, Take 1 tablet (40 mg total) by mouth 2 (two) times daily. As directed, Disp: 180 tablet, Rfl: 3   ipratropium-albuterol (DUONEB) 0.5-2.5 (3) MG/3ML SOLN, 1 NEBULE EVERY 6 HOURS AS NEEDED. **J45.3**, Disp: 270 mL, Rfl: 4   isosorbide mononitrate (IMDUR) 30 MG 24 hr tablet, TAKE 1 TABLET BY MOUTH EVERY DAY, Disp: 90 tablet, Rfl: 2   Nebulizers (COMPRESSOR/NEBULIZER) MISC, Use as directed, Disp: 1 each, Rfl: 0   Olodaterol HCl (STRIVERDI RESPIMAT) 2.5 MCG/ACT AERS, INHALE 2 PUFFS BY MOUTH INTO THE LUNGS DAILY, Disp: 4 g, Rfl: 12   pantoprazole (PROTONIX) 40 MG tablet, Take 1 tablet (40 mg total) by mouth 2 (two) times daily before a meal., Disp: 60 tablet, Rfl: 3   potassium chloride SA (KLOR-CON M20) 20 MEQ tablet, Take 1 tablet (20 mEq total) by mouth daily., Disp: 90 tablet, Rfl: 3   predniSONE (DELTASONE) 10 MG tablet, Take 10 mg by mouth in the morning and at bedtime., Disp: , Rfl:    pregabalin (LYRICA) 75 MG capsule, Take 1 capsule (75 mg total) by mouth 3 (three) times daily., Disp: 90 capsule, Rfl: 3   valACYclovir (VALTREX) 1000 MG tablet, Take 1 tablet (  1,000 mg total) by mouth 2 (two) times daily., Disp: 14 tablet, Rfl: 2

## 2022-10-31 ENCOUNTER — Telehealth: Payer: Self-pay | Admitting: Internal Medicine

## 2022-11-01 NOTE — Telephone Encounter (Signed)
Closing encounter since another is open

## 2022-11-01 NOTE — Telephone Encounter (Signed)
Fax has been sent back to [facility]. NFN

## 2022-11-03 ENCOUNTER — Other Ambulatory Visit: Payer: Self-pay | Admitting: Sports Medicine

## 2022-11-03 DIAGNOSIS — K219 Gastro-esophageal reflux disease without esophagitis: Secondary | ICD-10-CM

## 2022-11-04 ENCOUNTER — Telehealth: Payer: Self-pay | Admitting: Internal Medicine

## 2022-11-04 ENCOUNTER — Telehealth: Payer: Self-pay | Admitting: Sports Medicine

## 2022-11-04 ENCOUNTER — Other Ambulatory Visit: Payer: Self-pay | Admitting: Sports Medicine

## 2022-11-04 DIAGNOSIS — K219 Gastro-esophageal reflux disease without esophagitis: Secondary | ICD-10-CM

## 2022-11-04 NOTE — Telephone Encounter (Signed)
Received incoming call from [facility] requesting to check the status of medical clearance form faxed on 10/29/22 and 10/31/22.  Andrey Campanile will refax form today to [phone]. Once received, please complete, sign and fax back to [phone].

## 2022-11-04 NOTE — Telephone Encounter (Signed)
Follow Up:    Dois Davenport is calling to check on the status of patient's clearance. Patient is scheduled for surgery on Thursday([phone] Please fax asap to [phone].

## 2022-11-04 NOTE — Telephone Encounter (Signed)
Disregard previous message.   Previous completed medical form has been faxed to [employer] at [phone].

## 2022-11-05 NOTE — Telephone Encounter (Signed)
   Patient Name: Levi [name]  DOB: 05/11/1946 MRN: [id number]  Primary Cardiologist: Donato Schultz, MD  Chart reviewed as part of pre-operative protocol coverage. Pre-op clearance already addressed by colleagues in earlier phone notes. To summarize recommendations:  -OK to proceed from cardiac perspective  -Dr. Anne Fu  Per office protocol, patient can hold Eliquis for 2 days prior to procedure.   Will route this bundled recommendation to requesting provider via Epic fax function and remove from pre-op pool. Please call with questions.  Sharlene Dory, PA-C 11/05/2022, 8:37 AM

## 2022-11-06 ENCOUNTER — Encounter (HOSPITAL_COMMUNITY): Payer: Self-pay | Admitting: Orthopaedic Surgery

## 2022-11-06 DIAGNOSIS — Z6825 Body mass index (BMI) 25.0-25.9, adult: Secondary | ICD-10-CM | POA: Diagnosis not present

## 2022-11-06 DIAGNOSIS — G5702 Lesion of sciatic nerve, left lower limb: Secondary | ICD-10-CM | POA: Diagnosis not present

## 2022-11-06 NOTE — Progress Notes (Signed)
Anesthesia Chart Review: SAME DAY WORK-UP   Case: [id number] Date/Time: 11/07/22 1445   Procedure: LEFT FOURTH METATARSOPHALANGEAL CAPSULOTOMY, WEIL OSTEOTOMY (Left) - LENGTH OF SURGERY: 60 MINUTES   Anesthesia type: Choice   Pre-op diagnosis: LEFT FOOT METATARSALGIA   [facility] 09 / [facility]   Surgeons: Terance Hart, MD       DISCUSSION: Patient is a 77 year old male scheduled for the above procedure. He fell on 09/02/22  History includes never smoker, HLD, PAF (diagnosed 04/2012; unable to place Watchman device due to highly redundant/flexible interatrial septum 02/22/21), CHF, COPD/emphysema, asthma (steroid dependent), agent orange exposure, dyspnea, GERD/hiatal hernia (s/p Robotic Toupet fundoplication 6/823), BPH, diverticulitis, hypothyroidism, arthritis/DJD (left THA 02/21/10; left TKA 01/10/12; left reverse TSA 04/28/12; right TKA 06/17/14; right THA 07/31/15; right hand arthroplasty 08/07/20), spinal surgeries (L1-5 pedicle screw fixation, L4-5 posterolateral arthrodesis 07/30/10; C4-6 ACDF 07/21/12; revision L4-5 arthrodesis, PLIF L5-S1 06/15/13; T12-L1 total discectomy/anterolateral arthrodesis & C6-7 ACDF 04/07/17, complicated by left pleural opening with repair in OR but required left CT 04/11/17 for left pneumothorax; right C6-7 laminectomy 06/11/21). Fall on 09/02/22 with closed 9th right rib fracture.   Last visit with Dr. Anne Fu on 10/25/22. Preoperative cardiology input outlined by Jari Favre, PA-C on 10/29/22: "Pre-op clearance already addressed by colleagues in earlier phone notes. To summarize recommendations:   -OK to proceed from cardiac perspective  -Dr. Anne Fu   Per office protocol, patient can hold Eliquis for 2 days prior to procedure."  He reported last Eliquis 11/04/2022.   He is on prednisone 10 mg BID for moderate persistent asthma that is followed by pulmonologist Dr. Maple Hudson. Last visit 09/23/22 with no medication changes made. Dr. Maple Hudson also provided preoperative  input on 10/30/22, "He is clear from pulmonary standpoint for planned foot surgery.  Recommend cardiac clearance. Note he is on maintenance prednisone."  Anesthesia team to evaluate on the day of surgery.    VS:  BP Readings from Last 3 Encounters:  10/25/22 122/84  10/01/22 128/72  09/23/22 120/80   Pulse Readings from Last 3 Encounters:  10/25/22 74  10/01/22 85  09/23/22 88     PROVIDERS: Monica Becton, MD is PCP  - Donato Schultz, MD is cardiologist - Steffanie Dunn, MD is EP cardiologist. Patient evaluated on 01/23/21 for consideration of Watchman device due to increased risks related to anticoagulation therapy with his history of recurrent falls. Watchman device was attempted on 02/22/21, but "interatrial septum was highly redundant and transseptal puncture was too close to the outside wall of the left atrium to be safely performed.  Therefore watchman attempt was aborted." - Young, Joni Fears, MD is pulmonologist - Myra Gianotti, V. Anner Crete, MD is vascular surgeon - Huston Foley, MD is neurologist - Doristine Locks, DO is GI - Saw hematologist Arlan Organ, MD on 02/28/20 for hypogammaglobulinemia with non-healing right leg ulcer. "There is no hematologic malignancy.  He has mildly suppressed IgG level of 357 mg/dL.  His IgA level was 53 mg/dL.  He had no monoclonal spike in his blood.  There is no monoclonal light chains." He did not think the RLE ulcer was related to low imunoglobulins, and more likely from steroids. He was sent to the ED for further evaluation of RLE ulcer and swelling.    LABS: For updated labs on arrival as indicated. Last results in [facility] include: Lab Results  Component Value Date   WBC 9.7 07/05/2022   HGB 13.6 07/05/2022   HCT 40.2 07/05/2022   PLT 188  07/05/2022   GLUCOSE 113 (H) 07/05/2022   CHOL 208 (H) 07/05/2022   TRIG 65 07/05/2022   HDL 112 07/05/2022   LDLCALC 82 07/05/2022   ALT 18 07/05/2022   AST 19 07/05/2022   NA 140 07/05/2022   K  4.4 07/05/2022   CL 104 07/05/2022   CREATININE 1.01 07/05/2022   BUN 31 (H) 07/05/2022   CO2 25 07/05/2022   TSH 1.70 07/05/2022   HGBA1C 5.8 (H) 07/05/2022    "Office Spirometry 10/25/16-mild restriction of exhaled volume, minimal obstruction. FVC 4.04/78%, FEV1 2.71/71%, ratio 0.67, FEF 25-75% 1.68/58%"    IMAGES:  Xray Right Ribs/Chest 09/05/22: IMPRESSION: - There is recent minimally displaced fracture in the anterolateral aspect of right ninth rib. There is no focal pulmonary consolidation. There is no pneumothorax. - Deformities are seen in multiple bilateral ribs consistent with old healed fractures.    EKG: 10/25/22: Sinus rhythm with premature atrial complexes in a pattern of bigeminy     CV: BLE ABIs 06/20/22: Summary:  - Right: Resting right ankle-brachial index indicates noncompressible right  lower extremity arteries. The right toe-brachial index is normal.  - Left: Resting left ankle-brachial index indicates noncompressible left  lower extremity arteries. The left toe-brachial index is normal.    TEE (during attempted Watchman device) 02/22/21: IMPRESSIONS   1. Patient had a PFO with with aneurysmal mobile atrial septum TEE  guidance during transeptal showed puncture would be dangerous as location  away from PFO would result in puncture too close to the posterior LA wall.  Dr Lalla Brothers decided to abort case due  to risk of trans septal puncture.   2. Left ventricular ejection fraction, by estimation, is 45 to 50%. The  left ventricle has mildly decreased function. The left ventricle  demonstrates global hypokinesis. The left ventricular internal cavity size  was mildly dilated.   3. Right ventricular systolic function is normal. The right ventricular  size is normal.   4. No left atrial/left atrial appendage thrombus was detected.   5. The mitral valve is abnormal. Mild mitral valve regurgitation.   6. The aortic valve is tricuspid. There is mild calcification  of the  aortic valve. Aortic valve regurgitation is mild. Mild to moderate aortic  valve sclerosis/calcification is present, without any evidence of aortic  stenosis.   7. Small left to right shunt post trans septal puncture.     EP Procedure 02/22/21: PROCEDURES:  1. Transseptal puncture 2. Transesophageal echocardiogram 3. Left atrial appendage occlusive device placement.  Per report, "Despite many attempts, I was unable to find a suitable transseptal location due to the apposition of the posterior wall to the interatrial septum when the sheath engaged the septal tissue. Because of this close proximity/apposition, I did not think transseptal access was safe." CONCLUSIONS: 1. Aneurysmal interatrial septum 2. TEE demonstrating no LAA thrombus 3. No early apparent complications.      TTE 02/14/21: IMPRESSIONS   1. Left ventricular ejection fraction, by estimation, is 45 to 50%. The  left ventricle has mildly decreased function. The left ventricle  demonstrates global hypokinesis. There is mild concentric left ventricular  hypertrophy. Left ventricular diastolic  parameters are indeterminate.   2. Right ventricular systolic function is normal. The right ventricular  size is normal.   3. Left atrial size was mildly dilated.   4. The mitral valve is normal in structure. Trivial mitral valve  regurgitation. No evidence of mitral stenosis.   5. The aortic valve is tricuspid. Aortic valve regurgitation  is trivial.  Mild aortic valve sclerosis is present, with no evidence of aortic valve  stenosis.   6. The inferior vena cava is normal in size with greater than 50%  respiratory variability, suggesting right atrial pressure of 3 mmHg.  - Comparison(s): Prior images reviewed side by side.  - Conclusion(s)/Recommendation(s): Frequent PVCs noted throughout study,  interfering with full evaluation of function and wall motion.      CT Cardiac (for Watchman planning) 01/31/21: IMPRESSION: 1.  The left atrial appendage is a large chicken wing morphology without thrombus. 2. A 31mm Watchman FLX device is recommended based on the above landing zone measurements (25.4 mm maximum diameter; 20% compression). 3. There is no thrombus in the left atrial appendage. 4. An inferior posterior IAS puncture site is recommended. 5. Optimal deployment angle: RAO 2 CAU 15 6. Normal coronary origin. Right dominance. 7. Coronary artery calcium score is 197, which is 49th percentile for age and sex matched peers.     Cardiac cath 11/25/08 (done due to abnormal stress test on 11/22/08 that showed mild inferior ischemia, transient ischemic dilatation possibly indications 3V CAD,  EF 43%): IMPRESSION: 1. Calcified ostial left anterior descending lesion of up to 20% stenosis, which is not flow limiting, otherwise no angiographically significant coronary artery disease.  2. Left ventricular ejection fraction of approximately 50% with mildly dilated left ventricle and apical hypokinesis noted.  3. No abdominal aortic aneurysm. No renal artery stenosis.    Past Medical History:  Diagnosis Date   Agent orange exposure 1970's   takes Imdur,Diltiazem, and Atacand daily   ALLERGIC RHINITIS    Allergy    seasonal allergies   Asthma    uses inhaler and nebulizers   Bruises easily    d/t prednisone daily   Cataract    CHF (congestive heart failure) (HCC)    dx-on meds to treat   Chronic bronchitis (HCC)    "used to get it q yr; last time ?2008" (04/28/2012)   Clotting disorder (HCC) 2019   On blood thinner when I bleed it is for a consideral amount of time. This is tegardless of size of wound.   COPD (chronic obstructive pulmonary disease) (HCC)    agent orange exposure   Degenerative disk disease    "everywhere" (04/28/2012)   Diverticulitis    Dysrhythmia    afib   Elevated uric acid in blood    takes Allopurinol daily   Emphysema of lung (HCC)    Enlarged prostate    but not on any meds    GERD (gastroesophageal reflux disease)    takes Protonix daily   H/O hiatal hernia    Headache    History of pneumonia    a. 2010   Hyperlipidemia    takes Lipitor daily; pt. states he takes a preventive   Hypothyroidism    Joint pain    Joint swelling    Neuromuscular disorder (HCC)    bilat legs, and bilat arms   Osteoporosis 2012   PAF (paroxysmal atrial fibrillation) (HCC)    a. Dx 04/2012, CHA2DS2VASc = 1 (age);  b. 04/2012 Echo: EF 40-50%, mild MR.   Personal history of colonic adenomas 02/09/2013   Pneumonia    Shortness of breath dyspnea    Thyroid disease    no taking meds-caused dizziness    Past Surgical History:  Procedure Laterality Date   58 HOUR PH STUDY N/A 05/30/2021   Procedure: 24 HOUR PH STUDY;  Surgeon: Shellia Cleverly,  DO;  Location: [facility];  Service: Gastroenterology;  Laterality: N/A;   ANTERIOR CERVICAL DECOMP/DISCECTOMY FUSION N/A 07/21/2012   Procedure: ANTERIOR CERVICAL DECOMPRESSION/DISCECTOMY FUSION 2 LEVELS;  Surgeon: Barnett Abu, MD;  Location: [facility];  Service: Neurosurgery;  Laterality: N/A;  C4-5 C5-6 Anterior cervical decompression/diskectomy/fusion   ANTERIOR LAT LUMBAR FUSION N/A 04/07/2017   Procedure: Thoracic twelve-Lumbar one Anterolateral decompression/fusion;  Surgeon: Barnett Abu, MD;  Location: [facility];  Service: Neurosurgery;  Laterality: N/A;   BACK SURGERY     x 3   CARDIAC CATHETERIZATION  11/2008   Dr. Anne Fu - 20% calcified non flow limiting left main, 50% EF apical hypokinesis   CERVICAL DISC ARTHROPLASTY N/A 04/07/2017   Procedure: Cervical six-seven Disc arthroplasty;  Surgeon: Barnett Abu, MD;  Location: [facility] OR;  Service: Neurosurgery;  Laterality: N/A;   CHEST TUBE INSERTION Left 04/11/2017   Procedure: CHEST TUBE INSERTION;  Surgeon: Kerin Perna, MD;  Location: [facility] OR;  Service: Thoracic;  Laterality: Left;   CHOLECYSTECTOMY     COLONOSCOPY  2016   CG-MAC-miralax(good)servere TICS/TA x 2    ESOPHAGEAL MANOMETRY N/A 05/30/2021   Procedure: ESOPHAGEAL MANOMETRY (EM);  Surgeon: Shellia Cleverly, DO;  Location: [facility];  Service: Gastroenterology;  Laterality: N/A;  ph impedence   ESOPHAGOGASTRODUODENOSCOPY     EYE SURGERY Bilateral 2011   steroidal encapsulation" (04/28/2012)   FOOT SURGERY Right    x 2   JOINT REPLACEMENT     Many surgeries including 5 joit replacements   KNEE ARTHROSCOPY Bilateral    LATERAL / POSTERIOR COMBINED FUSION LUMBAR SPINE  2012   LEFT ATRIAL APPENDAGE OCCLUSION N/A 02/22/2021   Procedure: LEFT ATRIAL APPENDAGE OCCLUSION;  Surgeon: Lanier Prude, MD;  [facility];  Service: Cardiovascular;  Laterality: N/A;   POLYPECTOMY  2016   severe TICS/TA x 2   POSTERIOR CERVICAL LAMINECTOMY WITH MET- RX Right 06/11/2021   Procedure: Right Cervical six-seven  Laminectomy and foraminotomy with metrex;  Surgeon: Barnett Abu, MD;  Location: [facility] OR;  Service: Neurosurgery;  Laterality: Right;   REVERSE SHOULDER ARTHROPLASTY  04/28/2012   Procedure: REVERSE SHOULDER ARTHROPLASTY;  Surgeon: Mable Paris, MD;  Location: [facility] OR;  Service: Orthopedics;  Laterality: Left;  Left shouder reverse total shoulder arthroplasty   SHOULDER SURGERY     SPINE SURGERY     I have had 7 spinal surgeries   TEE WITHOUT CARDIOVERSION N/A 02/22/2021   Procedure: TRANSESOPHAGEAL ECHOCARDIOGRAM (TEE);  Surgeon: Lanier Prude, MD;  Location: [facility] INVASIVE CV LAB;  Service: Cardiovascular;  Laterality: N/A;   TENDON REPAIR Right 08/07/2020   Procedure: RIGHT HAND LIGAMENT RECONSTRUCTION AND TENDON INTERPOSITION;  Surgeon: Jodi Geralds, MD;  Location: [facility];  Service: Orthopedics;  Laterality: Right;   TONSILLECTOMY AND ADENOIDECTOMY  1954   TOTAL HIP ARTHROPLASTY Left 2011   "left" (04/28/2012)   TOTAL HIP ARTHROPLASTY Right 07/31/2015   Procedure: TOTAL HIP ARTHROPLASTY ANTERIOR APPROACH;  Surgeon: Jodi Geralds, MD;  [facility];  Service: Orthopedics;  Laterality: Right;   TOTAL KNEE ARTHROPLASTY  01/10/2012   Procedure: TOTAL KNEE ARTHROPLASTY;  Surgeon: Harvie Junior, MD;  Location[facility];  Service: Orthopedics;;  left total knee arthroplasty   TOTAL KNEE ARTHROPLASTY Right 06/17/2014   Procedure: TOTAL KNEE ARTHROPLASTY;  Surgeon: Harvie Junior, MD;  Location[facility];  Service: Orthopedics;  Laterality: Right;   toupet fundolplication  11/01/2021    MEDICATIONS: No current facility-administered medications  for this encounter.    albuterol (VENTOLIN HFA) 108 (90 Base) MCG/ACT inhaler   allopurinol (ZYLOPRIM) 100 MG tablet   apixaban (ELIQUIS) 5 MG TABS tablet   atorvastatin (LIPITOR) 20 MG tablet   cyclobenzaprine (FLEXERIL) 10 MG tablet   diclofenac Sodium (VOLTAREN) 1 % GEL   diltiazem (CARDIZEM CD) 180 MG 24 hr capsule   diltiazem (CARDIZEM) 30 MG tablet   furosemide (LASIX) 20 MG tablet   ipratropium-albuterol (DUONEB) 0.5-2.5 (3) MG/3ML SOLN   isosorbide mononitrate (IMDUR) 30 MG 24 hr tablet   lidocaine (LIDOCANNA) 4 %   Olodaterol HCl (STRIVERDI RESPIMAT) 2.5 MCG/ACT AERS   predniSONE (DELTASONE) 10 MG tablet   tamsulosin (FLOMAX) 0.4 MG CAPS capsule   furosemide (LASIX) 40 MG tablet   Nebulizers (COMPRESSOR/NEBULIZER) MISC   pantoprazole (PROTONIX) 40 MG tablet   potassium chloride SA (KLOR-CON M20) 20 MEQ tablet   pregabalin (LYRICA) 75 MG capsule   valACYclovir (VALTREX) 1000 MG tablet    Shonna Chock, PA-C Surgical Short Stay/Anesthesiology [facility] Phone [phone] [facility] Phone [phone] 11/06/2022 10:28 AM

## 2022-11-06 NOTE — Anesthesia Preprocedure Evaluation (Addendum)
Anesthesia Evaluation  Patient identified by MRN, date of birth, ID band Patient awake    Reviewed: Allergy & Precautions, NPO status , Patient's Chart, lab work & pertinent test results  Airway Mallampati: II  TM Distance: >3 FB Neck ROM: Limited    Dental  (+) Teeth Intact, Dental Advisory Given   Pulmonary asthma (chronic prednisone) , COPD,  COPD inhaler Took prednisone and inhalers today    Pulmonary exam normal breath sounds clear to auscultation       Cardiovascular hypertension (163/96 preop), Pt. on medications +CHF (LVEF 45-50%)  Normal cardiovascular exam+ dysrhythmias (eliquis LD 3d ago) Atrial Fibrillation + Valvular Problems/Murmurs (mild MR, mild AI) MR and AI  Rhythm:Regular Rate:Normal  Echo 2022  1. Patient had a PFO with with aneurysmal mobile atrial septum TEE  guidance during transeptal showed puncture would be dangerous as location  away from PFO would result in puncture too close to the posterior LA wall.  Dr Lalla Brothers decided to abort case due  to risk of trans septal puncture.   2. Left ventricular ejection fraction, by estimation, is 45 to 50%. The  left ventricle has mildly decreased function. The left ventricle  demonstrates global hypokinesis. The left ventricular internal cavity size  was mildly dilated.   3. Right ventricular systolic function is normal. The right ventricular  size is normal.   4. No left atrial/left atrial appendage thrombus was detected.   5. The mitral valve is abnormal. Mild mitral valve regurgitation.   6. The aortic valve is tricuspid. There is mild calcification of the  aortic valve. Aortic valve regurgitation is mild. Mild to moderate aortic  valve sclerosis/calcification is present, without any evidence of aortic  stenosis.   7. Small left to right shunt post trans septal puncture.      Neuro/Psych  Headaches  negative psych ROS   GI/Hepatic Neg liver ROS, hiatal  hernia,GERD  Medicated and Controlled,,  Endo/Other  Hypothyroidism    Renal/GU negative Renal ROS  negative genitourinary   Musculoskeletal  (+) Arthritis , Osteoarthritis,  L foot metatarsalgia    Abdominal   Peds  Hematology negative hematology ROS (+)   Anesthesia Other Findings   Reproductive/Obstetrics negative OB ROS                             Anesthesia Physical Anesthesia Plan  ASA: 3  Anesthesia Plan: General   Post-op Pain Management: Regional block* and Tylenol PO (pre-op)*   Induction: Intravenous  PONV Risk Score and Plan: 2 and Ondansetron, Dexamethasone and Treatment may vary due to age or medical condition  Airway Management Planned: LMA  Additional Equipment: None  Intra-op Plan:   Post-operative Plan: Extubation in OR  Informed Consent: I have reviewed the patients History and Physical, chart, labs and discussed the procedure including the risks, benefits and alternatives for the proposed anesthesia with the patient or authorized representative who has indicated his/her understanding and acceptance.     Dental advisory given  Plan Discussed with: CRNA  Anesthesia Plan Comments: (  )       Anesthesia Quick Evaluation

## 2022-11-06 NOTE — Progress Notes (Signed)
SDW call  Patient was given pre-op instructions over the phone. Patient verbalized understanding of instructions provided.   PCP - Dr. Dorann Lodge Cardiologist - Dr. Anne Fu Pulmonary: Dr. Felix Pacini   PPM/ICD - denies  Chest x-ray - 07/07/2021 EKG -  10/25/2022 Stress Test - 12/13/1008 ECHO - 02/23/2021 Cardiac Cath -   Sleep Study/sleep apnea/CPAP: Deneis  Non-diabetic   Blood Thinner Instructions: Eliquis, last dose 11/04/2022 Aspirin Instructions:denies   ERAS Protcol - Yes, clear fluids until noon PRE-SURGERY Ensure or G2-    COVID TEST- n/a    Anesthesia review: Yes.  CHF, COPD, asthma, emphysema, SOB, A-fib, Watchman, TAVR   Patient denies shortness of breath, fever, cough and chest pain over the phone call  Your procedure is scheduled on Thursday November 07, 2022  Report to [facility] Main Entrance "A" at  1230 P.M., then check in with the Admitting office.  Call this number if you have problems the morning of surgery:  [phone]   If you have any questions prior to your surgery date call [phone]: Open Monday-Friday 8am-4pm If you experience any cold or flu symptoms such as cough, fever, chills, shortness of breath, etc. between now and your scheduled surgery, please notify us at the above number    Remember:  Do not eat after midnight the night before your surgery  You may drink clear liquids until Noon the day of your surgery.   Clear liquids allowed are: Water, Non-Citrus Juices (without pulp), Carbonated Beverages, Clear Tea, Black Coffee ONLY (NO MILK, CREAM OR POWDERED CREAMER of any kind), and Gatorade   Take these medicines the morning of surgery with A SIP OF WATER:  Allopurinol, atorvastatin, diltiazem, isosorbide, striverdi respimat inhaler, protonix, prednisone  As needed: Albuterol inhaler and neb, flexeril  As of today, STOP taking any Aspirin (unless otherwise instructed by your surgeon) Aleve, Naproxen, Ibuprofen, Motrin, Advil, Goody's,  BC's, all herbal medications, fish oil, and all vitamins.

## 2022-11-06 NOTE — ED Provider Notes (Signed)
[name]    CSN: [id number] Arrival date & time: 09/02/22  1029      History   Chief Complaint Chief Complaint  Patient presents with   Elbow Pain    RT   Fall    HPI Levi [name] is a 77 y.o. male.   HPI patient had a fall in his home this morning. Has had multiple falls.  Today he complains of pain in his right elbow and right groin.  Also has a skin tear on his leg that he worsened.  He states his balance is poor.  Denies dizziness or black outs.    Elderly gentleman with multiple medical problems, see list below which I reviewed.  Her with wife..   Chronic anticoagulation  Concern for hip pain bc he has had bilat hip replacements.  Can ambulate and put full weight on right leg.  Past Medical History:  Diagnosis Date   Agent orange exposure 1970's   takes Imdur,Diltiazem, and Atacand daily   ALLERGIC RHINITIS    Allergy    seasonal allergies   Asthma    uses inhaler and nebulizers   Bruises easily    d/t prednisone daily   Cataract    CHF (congestive heart failure) (HCC)    dx-on meds to treat   Chronic bronchitis (HCC)    "used to get it q yr; last time ?2008" (04/28/2012)   Clotting disorder (HCC) 2019   On blood thinner when I bleed it is for a consideral amount of time. This is tegardless of size of wound.   COPD (chronic obstructive pulmonary disease) (HCC)    agent orange exposure   Degenerative disk disease    "everywhere" (04/28/2012)   Diverticulitis    Dysrhythmia    afib   Elevated uric acid in blood    takes Allopurinol daily   Emphysema of lung (HCC)    Enlarged prostate    but not on any meds   GERD (gastroesophageal reflux disease)    takes Protonix daily   H/O hiatal hernia    Headache    History of pneumonia    a. 2010   Hyperlipidemia    takes Lipitor daily; pt. states he takes a preventive   Hypothyroidism    Joint pain    Joint swelling    Neuromuscular disorder (HCC)    bilat legs, and bilat arms   Osteoporosis  2012   PAF (paroxysmal atrial fibrillation) (HCC)    a. Dx 04/2012, CHA2DS2VASc = 1 (age);  b. 04/2012 Echo: EF 40-50%, mild MR.   Personal history of colonic adenomas 02/09/2013   Pneumonia    Shortness of breath dyspnea    Thyroid disease    no taking meds-caused dizziness    Patient Active Problem List   Diagnosis Date Noted   Venous stasis ulcer (HCC) 09/13/2022   Primary osteoarthritis of right shoulder, status post left shoulder arthroplasty 07/05/2022   Shingles 05/16/2022   Olecranon bursitis, right elbow 04/04/2022   Vertigo 04/04/2022   Orthostatic hypotension 03/21/2022   BPH (benign prostatic hyperplasia) 02/21/2022   Annual physical exam 01/22/2022   Charcot's joint of foot, right 01/22/2022   Lower extremity edema 08/08/2021   Heartburn    Atypical chest pain    Cervical radicular pain 06/11/2021   Chronic anticoagulation 03/15/2021   Peripheral neuropathy 02/22/2021   Chronic systolic heart failure (HCC) 02/22/2021   Essential (primary) hypertension 11/15/2020   Arthritis of carpometacarpal (CMC) joint of right  thumb 08/07/2020   Cellulitis 03/22/2020   COPD (chronic obstructive pulmonary disease) (HCC) 03/21/2020   Chest wall pain 09/21/2019   GERD (gastroesophageal reflux disease) status post Nissen fundoplication 12/03/2017   Pneumothorax, left 04/11/2017   Lumbar stenosis with neurogenic claudication 04/07/2017   History of total right hip arthroplasty 07/31/2015   Primary osteoarthritis of right knee 06/17/2014   Thrush of mouth and esophagus (HCC) 04/03/2014   Palpitation 03/28/2014   Pseudoarthrosis of lumbar spine 06/15/2013   Hyperlipidemia    History of colonic polyps 02/09/2013   Atrial fibrillation (HCC) 05/11/2012   Osteoarthritis of left knee 01/10/2012   Seasonal and perennial allergic rhinitis 07/23/2010   Asthma, moderate persistent 03/06/2009    Past Surgical History:  Procedure Laterality Date   54 HOUR PH STUDY N/A 05/30/2021    Procedure: 24 HOUR PH STUDY;  Surgeon: Shellia Cleverly, DO;  Location: [facility];  Service: Gastroenterology;  Laterality: N/A;   ANTERIOR CERVICAL DECOMP/DISCECTOMY FUSION N/A 07/21/2012   Procedure: ANTERIOR CERVICAL DECOMPRESSION/DISCECTOMY FUSION 2 LEVELS;  Surgeon: Barnett Abu, MD;  Location: [facility];  Service: Neurosurgery;  Laterality: N/A;  C4-5 C5-6 Anterior cervical decompression/diskectomy/fusion   ANTERIOR LAT LUMBAR FUSION N/A 04/07/2017   Procedure: Thoracic twelve-Lumbar one Anterolateral decompression/fusion;  Surgeon: Barnett Abu, MD;  Location: [facility];  Service: Neurosurgery;  Laterality: N/A;   BACK SURGERY     x 3   CARDIAC CATHETERIZATION  11/2008   Dr. Anne Fu - 20% calcified non flow limiting left main, 50% EF apical hypokinesis   CERVICAL DISC ARTHROPLASTY N/A 04/07/2017   Procedure: Cervical six-seven Disc arthroplasty;  Surgeon: Barnett Abu, MD;  Location: [facility] OR;  Service: Neurosurgery;  Laterality: N/A;   CHEST TUBE INSERTION Left 04/11/2017   Procedure: CHEST TUBE INSERTION;  Surgeon: Kerin Perna, MD;  Location: [facility] OR;  Service: Thoracic;  Laterality: Left;   CHOLECYSTECTOMY     COLONOSCOPY  2016   CG-MAC-miralax(good)servere TICS/TA x 2   ESOPHAGEAL MANOMETRY N/A 05/30/2021   Procedure: ESOPHAGEAL MANOMETRY (EM);  Surgeon: Shellia Cleverly, DO;  Location: [facility];  Service: Gastroenterology;  Laterality: N/A;  ph impedence   ESOPHAGOGASTRODUODENOSCOPY     EYE SURGERY Bilateral 2011   steroidal encapsulation" (04/28/2012)   FOOT SURGERY Right    x 2   JOINT REPLACEMENT     Many surgeries including 5 joit replacements   KNEE ARTHROSCOPY Bilateral    LATERAL / POSTERIOR COMBINED FUSION LUMBAR SPINE  2012   LEFT ATRIAL APPENDAGE OCCLUSION N/A 02/22/2021   Procedure: LEFT ATRIAL APPENDAGE OCCLUSION;  Surgeon: Lanier Prude, MD;  [facility]: [facility];  Service: Cardiovascular;  Laterality: N/A;   POLYPECTOMY  2016   severe  TICS/TA x 2   POSTERIOR CERVICAL LAMINECTOMY WITH MET- RX Right 06/11/2021   Procedure: Right Cervical six-seven  Laminectomy and foraminotomy with metrex;  Surgeon: Barnett Abu, MD;  Location: [facility] OR;  Service: Neurosurgery;  Laterality: Right;   REVERSE SHOULDER ARTHROPLASTY  04/28/2012   Procedure: REVERSE SHOULDER ARTHROPLASTY;  Surgeon: Mable Paris, MD;  Location: [facility] OR;  Service: Orthopedics;  Laterality: Left;  Left shouder reverse total shoulder arthroplasty   SHOULDER SURGERY     SPINE SURGERY     I have had 7 spinal surgeries   TEE WITHOUT CARDIOVERSION N/A 02/22/2021   Procedure: TRANSESOPHAGEAL ECHOCARDIOGRAM (TEE);  Surgeon: Lanier Prude, MD;  Location: [facility] CV LAB;  Service: Cardiovascular;  Laterality: N/A;   TENDON REPAIR Right  08/07/2020   Procedure: RIGHT HAND LIGAMENT RECONSTRUCTION AND TENDON INTERPOSITION;  Surgeon: Jodi Geralds, MD;  Location: [facility];  Service: Orthopedics;  Laterality: Right;   TONSILLECTOMY AND ADENOIDECTOMY  1954   TOTAL HIP ARTHROPLASTY Left 2011   "left" (04/28/2012)   TOTAL HIP ARTHROPLASTY Right 07/31/2015   Procedure: TOTAL HIP ARTHROPLASTY ANTERIOR APPROACH;  Surgeon: Jodi Geralds, MD;  Location: [facility];  Service: Orthopedics;  Laterality: Right;   TOTAL KNEE ARTHROPLASTY  01/10/2012   Procedure: TOTAL KNEE ARTHROPLASTY;  Surgeon: Harvie Junior, MD;  Location: [facility];  Service: Orthopedics;;  left total knee arthroplasty   TOTAL KNEE ARTHROPLASTY Right 06/17/2014   Procedure: TOTAL KNEE ARTHROPLASTY;  Surgeon: Harvie Junior, MD;  Location: [facility];  Service: Orthopedics;  Laterality: Right;   toupet fundolplication  11/01/2021       Home Medications    Prior to Admission medications   Medication Sig Start Date End Date Taking? Authorizing Provider  albuterol (VENTOLIN HFA) 108 (90 Base) MCG/ACT inhaler INHALE 2 PUFFS INTO THE LUNGS EVERY 4 HOURS AS NEEDED FOR WHEEZING OR SHORTNESS OF BREATH. 01/07/22    Jetty Duhamel D, MD  allopurinol (ZYLOPRIM) 100 MG tablet Take 100 mg by mouth in the morning.    [provider]  apixaban (ELIQUIS) 5 MG TABS tablet TAKE 1 TABLET BY MOUTH TWICE A DAY 08/20/22   Jake Bathe, MD  atorvastatin (LIPITOR) 20 MG tablet TAKE 1 TABLET BY MOUTH EVERY DAY 09/17/22   Jake Bathe, MD  cyclobenzaprine (FLEXERIL) 10 MG tablet Take 1 tablet (10 mg total) by mouth at bedtime. Patient taking differently: Take 10 mg by mouth daily as needed for muscle spasms. 10/01/22   Monica Becton, MD  diclofenac Sodium (VOLTAREN) 1 % GEL Apply 2 g topically 3 (three) times daily as needed (pain.).    [provider]  diltiazem (CARDIZEM CD) 180 MG 24 hr capsule TAKE 1 CAPSULE BY MOUTH EVERY DAY 10/16/22   Jake Bathe, MD  diltiazem (CARDIZEM) 30 MG tablet Take 1 tablet (30 mg total) by mouth as needed (heart rate over 120). 10/25/22   Jake Bathe, MD  furosemide (LASIX) 20 MG tablet Take 20 mg by mouth in the morning.    [provider]  furosemide (LASIX) 40 MG tablet Take 1 tablet (40 mg total) by mouth 2 (two) times daily. As directed Patient not taking: Reported on 10/31/2022 09/13/22   Monica Becton, MD  ipratropium-albuterol (DUONEB) 0.5-2.5 (3) MG/3ML SOLN 1 NEBULE EVERY 6 HOURS AS NEEDED. **J45.3** 03/22/20   Young, Joni Fears D, MD  isosorbide mononitrate (IMDUR) 30 MG 24 hr tablet TAKE 1 TABLET BY MOUTH EVERY DAY 08/26/22   Jake Bathe, MD  lidocaine (LIDOCANNA) 4 % Place 1 patch onto the skin daily as needed (pain.).    [provider]  Nebulizers (COMPRESSOR/NEBULIZER) MISC Use as directed 12/01/17   Waymon Budge, MD  Olodaterol HCl (STRIVERDI RESPIMAT) 2.5 MCG/ACT AERS INHALE 2 PUFFS BY MOUTH INTO THE LUNGS DAILY 01/18/22   Jetty Duhamel D, MD  pantoprazole (PROTONIX) 40 MG tablet TAKE 1 TABLET (40 MG TOTAL) BY MOUTH TWICE A DAY BEFORE MEALS 11/04/22   Monica Becton, MD  potassium chloride SA (KLOR-CON M20) 20 MEQ  tablet Take 1 tablet (20 mEq total) by mouth daily. 09/05/21   Jake Bathe, MD  predniSONE (DELTASONE) 10 MG tablet Take 1 tablet (10 mg total) by mouth in the  morning and at bedtime. 10/30/22   Waymon Budge, MD  pregabalin (LYRICA) 75 MG capsule Take 1 capsule (75 mg total) by mouth 3 (three) times daily. Patient not taking: Reported on 11/06/2022 05/16/22   Monica Becton, MD  tamsulosin (FLOMAX) 0.4 MG CAPS capsule Take 0.4 mg by mouth at bedtime.    [provider]  valACYclovir (VALTREX) 1000 MG tablet Take 1 tablet (1,000 mg total) by mouth 2 (two) times daily. Patient not taking: Reported on 10/31/2022 05/16/22   Monica Becton, MD    Family History Family History  Problem Relation Age of Onset   Diabetes Mother    Heart disease Father        Died 54, MI   Cancer Father    Hyperlipidemia Father    Stroke Father    Prostate cancer Brother    Colon polyps Brother    Colon polyps Brother    Asthma Brother    COPD Brother    Colon cancer Brother        Dx age 73   Cancer Brother    Colon cancer Other    Esophageal cancer Neg Hx    Rectal cancer Neg Hx    Stomach cancer Neg Hx    Pancreatic cancer Neg Hx    Kidney disease Neg Hx    Liver disease Neg Hx    Neuropathy Neg Hx     Social History Social History   Tobacco Use   Smoking status: Never   Smokeless tobacco: Never  Vaping Use   Vaping Use: Never used  Substance Use Topics   Alcohol use: No   Drug use: No     Allergies   Morphine and codeine and Levaquin [levofloxacin]   Review of Systems Review of Systems See HPI  Physical Exam Triage Vital Signs ED Triage Vitals  Enc Vitals Group     BP 09/02/22 1041 130/75     Pulse Rate 09/02/22 1041 79     Resp 09/02/22 1041 17     Temp 09/02/22 1041 (!) 97.5 F (36.4 C)     Temp Source 09/02/22 1041 Oral     SpO2 09/02/22 1041 97 %     Weight --      Height --      Head Circumference --      Peak Flow --      Pain Score  09/02/22 1040 7     Pain Loc --      Pain Edu? --      Excl. in GC? --    No data found.  Updated Vital Signs BP 130/75 (BP Location: Left Arm)   Pulse 79   Temp (!) 97.5 F (36.4 C) (Oral)   Resp 17   SpO2 97%       Physical Exam Constitutional:      General: He is not in acute distress.    Appearance: He is well-developed.     Comments: Somewhat frail  HENT:     Head: Normocephalic and atraumatic.  Eyes:     Conjunctiva/sclera: Conjunctivae normal.     Pupils: Pupils are equal, round, and reactive to light.  Cardiovascular:     Rate and Rhythm: Normal rate.  Pulmonary:     Effort: Pulmonary effort is normal. No respiratory distress.  Abdominal:     General: There is no distension.     Palpations: Abdomen is soft.  Musculoskeletal:        General:  Signs of injury present. Normal range of motion.     Cervical back: Normal range of motion.     Comments: Right elbow pain but w full ROM and no boney tenderness.  R hip has good mobility and can weight bear.  Decision not to x ray.  Skin:    General: Skin is warm and dry.     Comments: Skin tear left ant leg cleaned and steri stripped for better approximation  Neurological:     Mental Status: He is alert.      UC Treatments / Results  Labs (all labs ordered are listed, but only abnormal results are displayed) Labs Reviewed - No data to display  EKG   Radiology No results found.  Procedures Procedures (including critical care time)  Medications Ordered in UC Medications  acetaminophen (TYLENOL) tablet 650 mg (650 mg Oral Given 09/02/22 1059)    Initial Impression / Assessment and Plan / UC Course  I have reviewed the triage vital signs and the nursing notes.  Pertinent labs & imaging results that were available during my care of the patient were reviewed by me and considered in my medical decision making (see chart for details).    Wound care discussed.  Final Clinical Impressions(s) / UC Diagnoses    Final diagnoses:  Abrasion  Fall in home, initial encounter  Skin tear of left lower leg without complication, initial encounter     Discharge Instructions      Change dressings once a day Watch for infection You can leave the tape on for about a week.  It will soak off Return for any problems    ED Prescriptions   None    PDMP not reviewed this encounter.   Eustace Moore, MD 11/06/22 [phone]

## 2022-11-07 ENCOUNTER — Ambulatory Visit (HOSPITAL_COMMUNITY): Payer: Medicare Other | Admitting: Vascular Surgery

## 2022-11-07 ENCOUNTER — Encounter (HOSPITAL_COMMUNITY): Payer: Self-pay | Admitting: Orthopaedic Surgery

## 2022-11-07 ENCOUNTER — Other Ambulatory Visit: Payer: Self-pay

## 2022-11-07 ENCOUNTER — Ambulatory Visit (HOSPITAL_BASED_OUTPATIENT_CLINIC_OR_DEPARTMENT_OTHER): Payer: Medicare Other | Admitting: Vascular Surgery

## 2022-11-07 ENCOUNTER — Encounter (HOSPITAL_COMMUNITY)
Admission: RE | Disposition: A | Discharge status: Home / Self Care | Payer: Self-pay | Source: Home / Self Care | Attending: Orthopaedic Surgery

## 2022-11-07 ENCOUNTER — Ambulatory Visit (HOSPITAL_COMMUNITY)
Admission: RE | Admit: 2022-11-07 | Discharge: 2022-11-07 | Disposition: A | Discharge status: Home / Self Care | Payer: Medicare Other | Attending: Orthopaedic Surgery | Admitting: Orthopaedic Surgery

## 2022-11-07 ENCOUNTER — Ambulatory Visit (HOSPITAL_COMMUNITY): Payer: Medicare Other

## 2022-11-07 DIAGNOSIS — M2042 Other hammer toe(s) (acquired), left foot: Secondary | ICD-10-CM | POA: Insufficient documentation

## 2022-11-07 DIAGNOSIS — M79672 Pain in left foot: Secondary | ICD-10-CM

## 2022-11-07 DIAGNOSIS — I509 Heart failure, unspecified: Secondary | ICD-10-CM

## 2022-11-07 DIAGNOSIS — Z7901 Long term (current) use of anticoagulants: Secondary | ICD-10-CM | POA: Insufficient documentation

## 2022-11-07 DIAGNOSIS — I110 Hypertensive heart disease with heart failure: Secondary | ICD-10-CM | POA: Diagnosis not present

## 2022-11-07 DIAGNOSIS — M7742 Metatarsalgia, left foot: Secondary | ICD-10-CM | POA: Insufficient documentation

## 2022-11-07 DIAGNOSIS — J449 Chronic obstructive pulmonary disease, unspecified: Secondary | ICD-10-CM | POA: Diagnosis not present

## 2022-11-07 DIAGNOSIS — K449 Diaphragmatic hernia without obstruction or gangrene: Secondary | ICD-10-CM | POA: Diagnosis not present

## 2022-11-07 DIAGNOSIS — J4489 Other specified chronic obstructive pulmonary disease: Secondary | ICD-10-CM | POA: Diagnosis not present

## 2022-11-07 DIAGNOSIS — X58XXXA Exposure to other specified factors, initial encounter: Secondary | ICD-10-CM | POA: Insufficient documentation

## 2022-11-07 DIAGNOSIS — S93125A Dislocation of metatarsophalangeal joint of left lesser toe(s), initial encounter: Secondary | ICD-10-CM | POA: Insufficient documentation

## 2022-11-07 DIAGNOSIS — I4891 Unspecified atrial fibrillation: Secondary | ICD-10-CM | POA: Insufficient documentation

## 2022-11-07 DIAGNOSIS — I080 Rheumatic disorders of both mitral and aortic valves: Secondary | ICD-10-CM | POA: Insufficient documentation

## 2022-11-07 DIAGNOSIS — Z7952 Long term (current) use of systemic steroids: Secondary | ICD-10-CM | POA: Insufficient documentation

## 2022-11-07 DIAGNOSIS — Q2112 Patent foramen ovale: Secondary | ICD-10-CM | POA: Insufficient documentation

## 2022-11-07 DIAGNOSIS — K219 Gastro-esophageal reflux disease without esophagitis: Secondary | ICD-10-CM | POA: Diagnosis not present

## 2022-11-07 DIAGNOSIS — M205X2 Other deformities of toe(s) (acquired), left foot: Secondary | ICD-10-CM | POA: Diagnosis not present

## 2022-11-07 DIAGNOSIS — G8918 Other acute postprocedural pain: Secondary | ICD-10-CM | POA: Diagnosis not present

## 2022-11-07 HISTORY — PX: ARTHRODESIS FOOT WITH WEIL OSTEOTOMY: SHX5589

## 2022-11-07 LAB — BASIC METABOLIC PANEL
Anion gap: 17 — ABNORMAL HIGH (ref 5–15)
BUN: 19 mg/dL (ref 8–23)
CO2: 19 mmol/L — ABNORMAL LOW (ref 22–32)
Calcium: 8.7 mg/dL — ABNORMAL LOW (ref 8.9–10.3)
Chloride: 101 mmol/L (ref 98–111)
Creatinine, Ser: 0.86 mg/dL (ref 0.61–1.24)
GFR, Estimated: >60 mL/min (ref >60)
Glucose, Bld: 107 mg/dL — ABNORMAL HIGH (ref 70–99)
Potassium: 3.6 mmol/L (ref 3.5–5.1)
Sodium: 137 mmol/L (ref 135–145)

## 2022-11-07 LAB — CBC
HCT: 38.5 % — ABNORMAL LOW (ref 39.0–52.0)
Hemoglobin: 12.6 g/dL — ABNORMAL LOW (ref 13.0–17.0)
MCH: 30.3 pg (ref 26.0–34.0)
MCHC: 32.7 g/dL (ref 30.0–36.0)
MCV: 92.5 fL (ref 80.0–100.0)
Platelets: 223 10*3/uL (ref 150–400)
RBC: 4.16 MIL/uL — ABNORMAL LOW (ref 4.22–5.81)
RDW: 16.5 % — ABNORMAL HIGH (ref 11.5–15.5)
WBC: 11.5 10*3/uL — ABNORMAL HIGH (ref 4.0–10.5)
nRBC: 0 % (ref 0.0–0.2)

## 2022-11-07 SURGERY — ARTHRODESIS FOOT WITH WEIL OSTEOTOMY
Anesthesia: General | Site: Third Toe | Laterality: Left

## 2022-11-07 MED ORDER — ORAL CARE MOUTH RINSE: 15.0000 mL | Freq: Once | OROMUCOSAL | Status: AC

## 2022-11-07 MED ORDER — AMISULPRIDE (ANTIEMETIC) 5 MG/2ML IV SOLN: 10.0000 mg | Freq: Once | INTRAVENOUS | Status: DC | PRN

## 2022-11-07 MED ORDER — PHENYLEPHRINE 80 MCG/ML (10ML) SYRINGE FOR IV PUSH (FOR BLOOD PRESSURE SUPPORT)
PREFILLED_SYRINGE | INTRAVENOUS | Status: DC | PRN
  Administered 2022-11-07: 80 ug via INTRAVENOUS

## 2022-11-07 MED ORDER — FENTANYL CITRATE (PF) 100 MCG/2ML IJ SOLN: 100.0000 ug | Freq: Once | INTRAMUSCULAR | Status: AC

## 2022-11-07 MED ORDER — FENTANYL CITRATE (PF) 100 MCG/2ML IJ SOLN
INTRAMUSCULAR | Status: AC
  Administered 2022-11-07: 100 ug via INTRAVENOUS
  Filled 2022-11-07: qty 2

## 2022-11-07 MED ORDER — LIDOCAINE 2% (20 MG/ML) 5 ML SYRINGE
INTRAMUSCULAR | Status: DC | PRN
  Administered 2022-11-07: 40 mg via INTRAVENOUS

## 2022-11-07 MED ORDER — ONDANSETRON HCL 4 MG/2ML IJ SOLN
INTRAMUSCULAR | Status: DC | PRN
  Administered 2022-11-07: 4 mg via INTRAVENOUS

## 2022-11-07 MED ORDER — ACETAMINOPHEN 500 MG PO TABS
1000.0000 mg | ORAL_TABLET | Freq: Once | ORAL | Status: AC
  Administered 2022-11-07: 1000 mg via ORAL
  Filled 2022-11-07: qty 2

## 2022-11-07 MED ORDER — ROPIVACAINE HCL 5 MG/ML IJ SOLN
INTRAMUSCULAR | Status: DC | PRN
  Administered 2022-11-07: 40 mL via PERINEURAL

## 2022-11-07 MED ORDER — PHENYLEPHRINE 80 MCG/ML (10ML) SYRINGE FOR IV PUSH (FOR BLOOD PRESSURE SUPPORT)
PREFILLED_SYRINGE | INTRAVENOUS | Status: AC
  Filled 2022-11-07: qty 10

## 2022-11-07 MED ORDER — OXYCODONE HCL 5 MG PO TABS: 5.0000 mg | ORAL_TABLET | Freq: Once | ORAL | Status: DC | PRN

## 2022-11-07 MED ORDER — HYDROMORPHONE HCL 1 MG/ML IJ SOLN: 0.2500 mg | INTRAMUSCULAR | Status: DC | PRN

## 2022-11-07 MED ORDER — OXYCODONE HCL 5 MG/5ML PO SOLN: 5.0000 mg | Freq: Once | ORAL | Status: DC | PRN

## 2022-11-07 MED ORDER — 0.9 % SODIUM CHLORIDE (POUR BTL) OPTIME
TOPICAL | Status: DC | PRN
  Administered 2022-11-07: 1000 mL

## 2022-11-07 MED ORDER — DEXAMETHASONE SODIUM PHOSPHATE 10 MG/ML IJ SOLN
INTRAMUSCULAR | Status: AC
  Filled 2022-11-07: qty 1

## 2022-11-07 MED ORDER — LIDOCAINE 2% (20 MG/ML) 5 ML SYRINGE
INTRAMUSCULAR | Status: AC
  Filled 2022-11-07: qty 5

## 2022-11-07 MED ORDER — PROPOFOL 10 MG/ML IV BOLUS
INTRAVENOUS | Status: DC | PRN
  Administered 2022-11-07: 150 mg via INTRAVENOUS
  Administered 2022-11-07: 50 mg via INTRAVENOUS

## 2022-11-07 MED ORDER — CHLORHEXIDINE GLUCONATE 0.12 % MT SOLN
15.0000 mL | Freq: Once | OROMUCOSAL | Status: AC
  Administered 2022-11-07: 15 mL via OROMUCOSAL
  Filled 2022-11-07: qty 15

## 2022-11-07 MED ORDER — CEFAZOLIN SODIUM-DEXTROSE 2-4 GM/100ML-% IV SOLN
2.0000 g | INTRAVENOUS | Status: AC
  Administered 2022-11-07: 2 g via INTRAVENOUS
  Filled 2022-11-07: qty 100

## 2022-11-07 MED ORDER — ONDANSETRON HCL 4 MG/2ML IJ SOLN: 4.0000 mg | Freq: Once | INTRAMUSCULAR | Status: DC | PRN

## 2022-11-07 MED ORDER — ONDANSETRON HCL 4 MG/2ML IJ SOLN
INTRAMUSCULAR | Status: AC
  Filled 2022-11-07: qty 2

## 2022-11-07 MED ORDER — DEXAMETHASONE SODIUM PHOSPHATE 10 MG/ML IJ SOLN
INTRAMUSCULAR | Status: DC | PRN
  Administered 2022-11-07: 10 mg

## 2022-11-07 MED ORDER — PROPOFOL 10 MG/ML IV BOLUS
INTRAVENOUS | Status: AC
  Filled 2022-11-07: qty 20

## 2022-11-07 MED ORDER — LACTATED RINGERS IV SOLN: INTRAVENOUS | Status: DC

## 2022-11-07 MED ORDER — PHENYLEPHRINE HCL-NACL 20-0.9 MG/250ML-% IV SOLN
INTRAVENOUS | Status: DC | PRN
  Administered 2022-11-07: 25 ug/min via INTRAVENOUS

## 2022-11-07 MED ORDER — OXYCODONE HCL 5 MG PO TABS: 5.0000 mg | ORAL_TABLET | ORAL | 0 refills | Status: DC | PRN

## 2022-11-07 MED ORDER — DEXAMETHASONE SODIUM PHOSPHATE 10 MG/ML IJ SOLN
INTRAMUSCULAR | Status: DC | PRN
  Administered 2022-11-07: 5 mg via INTRAVENOUS

## 2022-11-07 SURGICAL SUPPLY — 52 items
APL PRP STRL LF DISP 70% ISPRP (MISCELLANEOUS) ×1
APL SKNCLS STERI-STRIP NONHPOA (GAUZE/BANDAGES/DRESSINGS)
BANDAGE ESMARK 6X9 LF (GAUZE/BANDAGES/DRESSINGS) IMPLANT
BENZOIN TINCTURE PRP APPL 2/3 (GAUZE/BANDAGES/DRESSINGS) IMPLANT
BIT DRILL SHORT 1.5 (BIT) IMPLANT
BLADE AVERAGE 25X9 (BLADE) IMPLANT
BLADE SURG 15 STRL LF DISP TIS (BLADE) ×3 IMPLANT
BLADE SURG 15 STRL SS (BLADE) ×4
BNDG CMPR 5X4 KNIT ELC UNQ LF (GAUZE/BANDAGES/DRESSINGS) ×1
BNDG CMPR 9X4 STRL LF SNTH (GAUZE/BANDAGES/DRESSINGS) ×1
BNDG CMPR 9X6 STRL LF SNTH (GAUZE/BANDAGES/DRESSINGS)
BNDG ELASTIC 4INX 5YD STR LF (GAUZE/BANDAGES/DRESSINGS) IMPLANT
BNDG ELASTIC 6X5.8 VLCR STR LF (GAUZE/BANDAGES/DRESSINGS) ×2 IMPLANT
BNDG ESMARK 4X9 LF (GAUZE/BANDAGES/DRESSINGS) IMPLANT
BNDG ESMARK 6X9 LF (GAUZE/BANDAGES/DRESSINGS)
CHLORAPREP W/TINT 26 (MISCELLANEOUS) ×1 IMPLANT
COVER BACK TABLE 60X90IN (DRAPES) ×1 IMPLANT
CUFF TOURN SGL QUICK 34 (TOURNIQUET CUFF)
CUFF TRNQT CYL 34X4.125X (TOURNIQUET CUFF) ×1 IMPLANT
DRAPE C-ARM 42X72 X-RAY (DRAPES) ×1 IMPLANT
DRAPE OEC MINIVIEW 54X84 (DRAPES) IMPLANT
DRAPE U-SHAPE 47X51 STRL (DRAPES) ×1 IMPLANT
DRSG XEROFORM 1X8 (GAUZE/BANDAGES/DRESSINGS) IMPLANT
ELECT REM PT RETURN 9FT ADLT (ELECTROSURGICAL) ×1
ELECTRODE REM PT RTRN 9FT ADLT (ELECTROSURGICAL) ×1 IMPLANT
GAUZE SPONGE 4X4 12PLY STRL (GAUZE/BANDAGES/DRESSINGS) ×1 IMPLANT
GAUZE XEROFORM 1X8 LF (GAUZE/BANDAGES/DRESSINGS) ×1 IMPLANT
GLOVE BIOGEL M STRL SZ7.5 (GLOVE) ×4 IMPLANT
GLOVE BIOGEL PI IND STRL 8 (GLOVE) ×2 IMPLANT
GOWN STRL REUS W/ TWL XL LVL3 (GOWN DISPOSABLE) ×2 IMPLANT
GOWN STRL REUS W/TWL XL LVL3 (GOWN DISPOSABLE) ×2
KIT BIOCARTILAGE LG JOINT MIX (KITS) IMPLANT
NS IRRIG 1000ML POUR BTL (IV SOLUTION) ×1 IMPLANT
PACK ORTHO EXTREMITY (CUSTOM PROCEDURE TRAY) ×1 IMPLANT
PAD CAST 4YDX4 CTTN HI CHSV (CAST SUPPLIES) ×1 IMPLANT
PADDING CAST COTTON 4X4 STRL (CAST SUPPLIES) ×1
PADDING CAST SYNTHETIC 4X4 STR (CAST SUPPLIES) ×1 IMPLANT
SCREW L/P 2.0X12 CORTICAL (Screw) IMPLANT
SLEEVE SCD COMPRESS KNEE MED (STOCKING) ×1 IMPLANT
SPIKE FLUID TRANSFER (MISCELLANEOUS) IMPLANT
SPLINT PLASTER CAST FAST 5X30 (CAST SUPPLIES) ×20 IMPLANT
STRIP CLOSURE SKIN 1/2X4 (GAUZE/BANDAGES/DRESSINGS) IMPLANT
SUCTION TUBE FRAZIER 10FR DISP (SUCTIONS) ×1 IMPLANT
SUT ETHILON 3 0 PS 1 (SUTURE) ×1 IMPLANT
SUT MNCRL AB 3-0 PS2 18 (SUTURE) ×1 IMPLANT
SUT PDS AB 2-0 CT2 27 (SUTURE) ×1 IMPLANT
SUT VIC AB 3-0 FS2 27 (SUTURE) IMPLANT
SYR 5ML LL (SYRINGE) ×1 IMPLANT
SYR BULB EAR ULCER 3OZ GRN STR (SYRINGE) ×1 IMPLANT
TOWEL GREEN STERILE FF (TOWEL DISPOSABLE) ×2 IMPLANT
TUBE CONNECTING 20X1/4 (TUBING) ×1 IMPLANT
UNDERPAD 30X36 HEAVY ABSORB (UNDERPADS AND DIAPERS) ×1 IMPLANT

## 2022-11-07 NOTE — Discharge Instructions (Signed)
[facility] POST-OP INSTRUCTIONS   Pain Management The numbing medicine and your leg will last around 18 hours, take a dose of your pain medicine as soon as you feel it wearing off to avoid rebound pain. Keep your foot elevated above heart level.  Make sure that your heel hangs free ('floats'). Take all prescribed medication as directed. If taking narcotic pain medication you may want to use an over-the-counter stool softener to avoid constipation. You may take over-the-counter NSAIDs (ibuprofen, naproxen, etc.) as well as over-the-counter acetaminophen as directed on the packaging as a supplement for your pain and may also use it to wean away from the prescription medication.  Activity Heel WB in post operative shoe. Keep dressing in place  First Postoperative Visit Your first postop visit will be at least 2 weeks after surgery.  This should be scheduled when you schedule surgery. If you do not have a postoperative visit scheduled please call [phone] to schedule an appointment. At the appointment your incision will be evaluated for suture removal, x-rays will be obtained if necessary.  General Instructions Swelling is very common after foot and ankle surgery.  It often takes 3 months for the foot and ankle to begin to feel comfortable.  Some amount of swelling will persist for 6-12 months. DO NOT change the dressing.  If there is a problem with the dressing (too tight, loose, gets wet, etc.) please contact Dr. Adair's office. DO NOT get the dressing wet.  For showers you can use an over-the-counter cast cover or wrap a washcloth around the top of your dressing and then cover it with a plastic bag and tape it to your leg. DO NOT soak the incision (no tubs, pools, bath, etc.) until you have approval from Dr. Adair.  Contact Dr. Adairs office or go to Emergency Room if: Temperature above 101 F. Increasing pain that is unresponsive to pain medication or  elevation Excessive redness or swelling in your foot Dressing problems - excessive bloody drainage, looseness or tightness, or if dressing gets wet Develop pain, swelling, warmth, or discoloration of your calf  

## 2022-11-07 NOTE — Anesthesia Procedure Notes (Addendum)
Anesthesia Regional Block: Popliteal block   Pre-Anesthetic Checklist: , timeout performed,  Correct Patient, Correct Site, Correct Laterality,  Correct Procedure, Correct Position, site marked,  Risks and benefits discussed,  Surgical consent,  Pre-op evaluation,  At surgeon's request and post-op pain management  Laterality: Left  Prep: Maximum Sterile Barrier Precautions used, chloraprep       Needles:  Injection technique: Single-shot  Needle Type: Echogenic Stimulator Needle     Needle Length: 9cm  Needle Gauge: 22     Additional Needles:   Procedures:,,,, ultrasound used (permanent image in chart),,    Narrative:  Start time: 11/07/2022 1:50 PM End time: 11/07/2022 1:55 PM Injection made incrementally with aspirations every 5 mL.  Performed by: Personally  Anesthesiologist: Lannie Fields, DO  Additional Notes: Monitors applied. No increased pain on injection. No increased resistance to injection. Injection made in 5cc increments. Good needle visualization. Patient tolerated procedure well.

## 2022-11-07 NOTE — Transfer of Care (Signed)
Immediate Anesthesia Transfer of Care Note  Patient: Levi [name]  Procedure(s) Performed: LEFT THIRD METATARSOPHALANGEAL CAPSULOTOMY, WEIL OSTEOTOMY (Left: Third Toe)  Patient Location: PACU  Anesthesia Type:GA combined with regional for post-op pain  Level of Consciousness: awake, alert , and oriented  Airway & Oxygen Therapy: Patient Spontanous Breathing  Post-op Assessment: Report given to RN and Post -op Vital signs reviewed and stable  Post vital signs: Reviewed and stable  Last Vitals:  Vitals Value Taken Time  BP 154/82 11/07/22 1507  Temp 36.3 C 11/07/22 1507  Pulse 73 11/07/22 1509  Resp 17 11/07/22 1509  SpO2 97 % 11/07/22 1509  Vitals shown include unvalidated device data.  Last Pain:  Vitals:   11/07/22 1059  TempSrc:   PainSc: 0-No pain         Complications: No notable events documented.

## 2022-11-07 NOTE — Anesthesia Procedure Notes (Signed)
Procedure Name: LMA Insertion Date/Time: 11/07/2022 2:35 PM  Performed by: Randon Goldsmith, CRNAPre-anesthesia Checklist: Patient identified, Emergency Drugs available, Suction available and Patient being monitored Patient Re-evaluated:Patient Re-evaluated prior to induction Oxygen Delivery Method: Circle system utilized Preoxygenation: Pre-oxygenation with 100% oxygen Induction Type: IV induction Ventilation: Mask ventilation without difficulty LMA: LMA inserted LMA Size: 5.0 Airway Equipment and Method: Bite block Placement Confirmation: breath sounds checked- equal and bilateral and positive ETCO2 Tube secured with: Tape Dental Injury: Teeth and Oropharynx as per pre-operative assessment

## 2022-11-07 NOTE — Progress Notes (Signed)
Orthopedic Tech Progress Note Patient Details:  DYON ROTERT 08-12-1945 [id number]  [place], RN called asking for a po shoe per orders from Dr. Susa Simmonds.  Ortho Devices Type of Ortho Device: Postop shoe/boot Ortho Device/Splint Location: for LLE Ortho Device/Splint Interventions: Ordered, Adjustment, Application   Post Interventions Patient Tolerated: Well Instructions Provided: Care of device, Adjustment of device  Merlin Ege Carmine Savoy 11/07/2022, 5:09 PM

## 2022-11-07 NOTE — Anesthesia Procedure Notes (Signed)
Anesthesia Regional Block: Adductor canal block   Pre-Anesthetic Checklist: , timeout performed,  Correct Patient, Correct Site, Correct Laterality,  Correct Procedure, Correct Position, site marked,  Risks and benefits discussed,  Surgical consent,  Pre-op evaluation,  At surgeon's request and post-op pain management  Laterality: Left  Prep: Maximum Sterile Barrier Precautions used, chloraprep       Needles:  Injection technique: Single-shot  Needle Type: Echogenic Stimulator Needle     Needle Length: 9cm  Needle Gauge: 22     Additional Needles:   Procedures:,,,, ultrasound used (permanent image in chart),,    Narrative:  Start time: 11/07/2022 1:55 PM End time: 11/07/2022 2:00 PM Injection made incrementally with aspirations every 5 mL.  Performed by: Personally  Anesthesiologist: Lannie Fields, DO  Additional Notes: Monitors applied. No increased pain on injection. No increased resistance to injection. Injection made in 5cc increments. Good needle visualization. Patient tolerated procedure well.

## 2022-11-07 NOTE — Anesthesia Postprocedure Evaluation (Signed)
Anesthesia Post Note  Patient: Sindy Guadeloupe  Procedure(s) Performed: LEFT THIRD METATARSOPHALANGEAL CAPSULOTOMY, WEIL OSTEOTOMY (Left: Third Toe)     Patient location during evaluation: PACU Anesthesia Type: General and Regional Level of consciousness: awake and alert, oriented and patient cooperative Pain management: pain level controlled Vital Signs Assessment: post-procedure vital signs reviewed and stable Respiratory status: spontaneous breathing, nonlabored ventilation and respiratory function stable Cardiovascular status: blood pressure returned to baseline and stable Postop Assessment: no apparent nausea or vomiting Anesthetic complications: no   No notable events documented.  Last Vitals:  Vitals:   11/07/22 1530 11/07/22 1545  BP: (!) 153/74 (!) 152/84  Pulse: 68 (!) 58  Resp: (!) 21 15  Temp:  (!) 36.3 C  SpO2: 94% 97%    Last Pain:  Vitals:   11/07/22 1545  TempSrc:   PainSc: 0-No pain                 Lannie Fields

## 2022-11-07 NOTE — H&P (Signed)
PREOPERATIVE H&P  Chief Complaint: Left foot third hammertoe with metatarsalgia  HPI: Levi [name] is a 77 y.o. male who presents for preoperative history and physical with a diagnosis of left foot third hammertoe with metatarsalgia and significantly prominent third metatarsal base with overlying intractable plantar callosity and fat pad atrophy.  He failed conservative treatment form of cushioning, splinting and other modalities and is indicated for surgery.  He is here today for surgery.. Symptoms are rated as moderate to severe, and have been worsening.  This is significantly impairing activities of daily living.  He has elected for surgical management.   Past Medical History:  Diagnosis Date   Agent orange exposure 1970's   takes Imdur,Diltiazem, and Atacand daily   ALLERGIC RHINITIS    Allergy    seasonal allergies   Asthma    uses inhaler and nebulizers   Bruises easily    d/t prednisone daily   Cataract    CHF (congestive heart failure) (HCC)    dx-on meds to treat   Chronic bronchitis (HCC)    "used to get it q yr; last time ?2008" (04/28/2012)   Clotting disorder (HCC) 2019   On blood thinner when I bleed it is for a consideral amount of time. This is tegardless of size of wound.   COPD (chronic obstructive pulmonary disease) (HCC)    agent orange exposure   Degenerative disk disease    "everywhere" (04/28/2012)   Diverticulitis    Dysrhythmia    afib   Elevated uric acid in blood    takes Allopurinol daily   Emphysema of lung (HCC)    Enlarged prostate    but not on any meds   GERD (gastroesophageal reflux disease)    takes Protonix daily   H/O hiatal hernia    Headache    History of pneumonia    a. 2010   Hyperlipidemia    takes Lipitor daily; pt. states he takes a preventive   Hypothyroidism    Joint pain    Joint swelling    Neuromuscular disorder (HCC)    bilat legs, and bilat arms   Osteoporosis 2012   PAF (paroxysmal atrial fibrillation) (HCC)    a.  Dx 04/2012, CHA2DS2VASc = 1 (age);  b. 04/2012 Echo: EF 40-50%, mild MR.   Personal history of colonic adenomas 02/09/2013   Pneumonia    Shortness of breath dyspnea    Thyroid disease    no taking meds-caused dizziness   Past Surgical History:  Procedure Laterality Date   66 HOUR PH STUDY N/A 05/30/2021   Procedure: 24 HOUR PH STUDY;  Surgeon: Shellia Cleverly, DO;  Location: [facility];  Service: Gastroenterology;  Laterality: N/A;   ANTERIOR CERVICAL DECOMP/DISCECTOMY FUSION N/A 07/21/2012   Procedure: ANTERIOR CERVICAL DECOMPRESSION/DISCECTOMY FUSION 2 LEVELS;  Surgeon: Barnett Abu, MD;  Location: [facility];  Service: Neurosurgery;  Laterality: N/A;  C4-5 C5-6 Anterior cervical decompression/diskectomy/fusion   ANTERIOR LAT LUMBAR FUSION N/A 04/07/2017   Procedure: Thoracic twelve-Lumbar one Anterolateral decompression/fusion;  Surgeon: Barnett Abu, MD;  Location: [facility] OR;  Service: Neurosurgery;  Laterality: N/A;   BACK SURGERY     x 3   CARDIAC CATHETERIZATION  11/2008   Dr. Anne Fu - 20% calcified non flow limiting left main, 50% EF apical hypokinesis   CERVICAL DISC ARTHROPLASTY N/A 04/07/2017   Procedure: Cervical six-seven Disc arthroplasty;  Surgeon: Barnett Abu, MD;  Location: [facility] OR;  Service: Neurosurgery;  Laterality: N/A;   CHEST TUBE INSERTION  Left 04/11/2017   Procedure: CHEST TUBE INSERTION;  Surgeon: Kerin Perna, MD;  Location: [facility] OR;  Service: Thoracic;  Laterality: Left;   CHOLECYSTECTOMY     COLONOSCOPY  2016   CG-MAC-miralax(good)servere TICS/TA x 2   ESOPHAGEAL MANOMETRY N/A 05/30/2021   Procedure: ESOPHAGEAL MANOMETRY (EM);  Surgeon: Shellia Cleverly, DO;  Location: [facility];  Service: Gastroenterology;  Laterality: N/A;  ph impedence   ESOPHAGOGASTRODUODENOSCOPY     EYE SURGERY Bilateral 2011   steroidal encapsulation" (04/28/2012)   FOOT SURGERY Right    x 2   JOINT REPLACEMENT     Many surgeries including 5 joit replacements   KNEE  ARTHROSCOPY Bilateral    LATERAL / POSTERIOR COMBINED FUSION LUMBAR SPINE  2012   LEFT ATRIAL APPENDAGE OCCLUSION N/A 02/22/2021   Procedure: LEFT ATRIAL APPENDAGE OCCLUSION;  Surgeon: Lanier Prude, MD;  [facility]: [facility];  Service: Cardiovascular;  Laterality: N/A;   POLYPECTOMY  2016   severe TICS/TA x 2   POSTERIOR CERVICAL LAMINECTOMY WITH MET- RX Right 06/11/2021   Procedure: Right Cervical six-seven  Laminectomy and foraminotomy with metrex;  Surgeon: Barnett Abu, MD;  Location: [facility] OR;  Service: Neurosurgery;  Laterality: Right;   REVERSE SHOULDER ARTHROPLASTY  04/28/2012   Procedure: REVERSE SHOULDER ARTHROPLASTY;  Surgeon: Mable Paris, MD;  Location: [facility] OR;  Service: Orthopedics;  Laterality: Left;  Left shouder reverse total shoulder arthroplasty   SHOULDER SURGERY     SPINE SURGERY     I have had 7 spinal surgeries   TEE WITHOUT CARDIOVERSION N/A 02/22/2021   Procedure: TRANSESOPHAGEAL ECHOCARDIOGRAM (TEE);  Surgeon: Lanier Prude, MD;  Location: [facility] INVASIVE CV LAB;  Service: Cardiovascular;  Laterality: N/A;   TENDON REPAIR Right 08/07/2020   Procedure: RIGHT HAND LIGAMENT RECONSTRUCTION AND TENDON INTERPOSITION;  Surgeon: Jodi Geralds, MD;  Location: [facility];  Service: Orthopedics;  Laterality: Right;   TONSILLECTOMY AND ADENOIDECTOMY  1954   TOTAL HIP ARTHROPLASTY Left 2011   "left" (04/28/2012)   TOTAL HIP ARTHROPLASTY Right 07/31/2015   Procedure: TOTAL HIP ARTHROPLASTY ANTERIOR APPROACH;  Surgeon: Jodi Geralds, MD;  [facility]: [facility];  Service: Orthopedics;  Laterality: Right;   TOTAL KNEE ARTHROPLASTY  01/10/2012   Procedure: TOTAL KNEE ARTHROPLASTY;  Surgeon: Harvie Junior, MD;  Location: [facility] OR;  Service: Orthopedics;;  left total knee arthroplasty   TOTAL KNEE ARTHROPLASTY Right 06/17/2014   Procedure: TOTAL KNEE ARTHROPLASTY;  Surgeon: Harvie Junior, MD;  Location: [facility] OR;  Service: Orthopedics;  Laterality: Right;    toupet fundolplication  11/01/2021   Social History   Socioeconomic History   Marital status: Married    Spouse name: Jill Side   Number of children: 1   Years of education: 16   Highest education level: Bachelor's degree (e.g., BA, AB, BS)  Occupational History   Occupation: Retired    Comment: Nurse, 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 Daughter lives two miles away. 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 Enjoys being  active and working.   Social Determinants of Health   Financial Resource Strain: Low Risk  (10/02/2022)   Overall Financial Resource Strain (CARDIA)    Difficulty of Paying Living Expenses: Not hard at all  Food Insecurity: No Food Insecurity (10/02/2022)   Hunger Vital Sign    Worried About Running Out of Food in the Last Year: Never true    Ran Out of Food in the Last Year: Never true  Transportation Needs: No Transportation Needs (10/02/2022)   PRAPARE - Administrator, Civil Service (Medical): No    Lack of Transportation (Non-Medical): No  Physical Activity: Sufficiently Active (10/02/2022)   Exercise Vital Sign    Days of Exercise per Week: 7 days    Minutes of Exercise per Session: 60 min  Stress: No Stress Concern Present (10/02/2022)   [employer] of Occupational Health - Occupational Stress Questionnaire    Feeling of Stress : Only a little  Recent Concern: Stress - Stress Concern Present (09/10/2022)   [employer] of Occupational Health - Occupational Stress Questionnaire    Feeling of Stress : To some extent  Social  Connections: Socially Integrated (10/03/2022)   Social Connection and Isolation Panel [NHANES]    Frequency of Communication with Friends and Family: More than three times a week    Frequency of Social Gatherings with Friends and Family: Never    Attends Religious Services: More than 4 times per year    Active Member of [employer] or Organizations: Yes    Attends Banker Meetings: Never    Marital Status: Married   Family History  Problem Relation Age of Onset   Diabetes Mother    Heart disease Father        Died 48, MI   Cancer Father    Hyperlipidemia Father    Stroke Father    Prostate cancer Brother    Colon polyps Brother    Colon polyps Brother    Asthma Brother    COPD Brother    Colon cancer Brother        Dx age 45   Cancer Brother    Colon cancer Other    Esophageal cancer Neg Hx    Rectal cancer Neg Hx    Stomach cancer Neg Hx    Pancreatic cancer Neg Hx    Kidney disease Neg Hx    Liver disease Neg Hx    Neuropathy Neg Hx    Allergies  Allergen Reactions   Morphine And Codeine Palpitations and Other (See Comments)    Made patient light-headed and sweaty; "my heart races" (04/28/2012), pt states too much morphine gave him this reaction   Levaquin [Levofloxacin] Itching   Prior to Admission medications   Medication Sig Start Date End Date Taking? Authorizing Provider  albuterol (VENTOLIN HFA) 108 (90 Base) MCG/ACT inhaler INHALE 2 PUFFS INTO THE LUNGS EVERY 4 HOURS AS NEEDED FOR WHEEZING OR SHORTNESS OF BREATH. 01/07/22  Yes Young, Clinton D, MD  allopurinol (ZYLOPRIM) 100 MG tablet Take 100 mg by mouth in the morning.   Yes [provider]  apixaban (ELIQUIS) 5 MG TABS tablet TAKE 1 TABLET BY MOUTH TWICE A DAY 08/20/22  Yes Jake Bathe, MD  atorvastatin (LIPITOR) 20 MG tablet TAKE 1 TABLET BY MOUTH EVERY DAY 09/17/22  Yes Jake Bathe, MD  cyclobenzaprine (FLEXERIL) 10 MG tablet Take 1 tablet (10 mg total) by mouth at bedtime. Patient taking  differently: Take 10 mg by mouth  daily as needed for muscle spasms. 10/01/22  Yes Monica Becton, MD  diclofenac Sodium (VOLTAREN) 1 % GEL Apply 2 g topically 3 (three) times daily as needed (pain.).   Yes [provider]  diltiazem (CARDIZEM CD) 180 MG 24 hr capsule TAKE 1 CAPSULE BY MOUTH EVERY DAY 10/16/22  Yes Jake Bathe, MD  furosemide (LASIX) 20 MG tablet Take 20 mg by mouth in the morning.   Yes [provider]  ipratropium-albuterol (DUONEB) 0.5-2.5 (3) MG/3ML SOLN 1 NEBULE EVERY 6 HOURS AS NEEDED. **J45.3** 03/22/20  Yes Young, Joni Fears D, MD  isosorbide mononitrate (IMDUR) 30 MG 24 hr tablet TAKE 1 TABLET BY MOUTH EVERY DAY 08/26/22  Yes Jake Bathe, MD  lidocaine (LIDOCANNA) 4 % Place 1 patch onto the skin daily as needed (pain.).   Yes [provider]  Olodaterol HCl (STRIVERDI RESPIMAT) 2.5 MCG/ACT AERS INHALE 2 PUFFS BY MOUTH INTO THE LUNGS DAILY 01/18/22  Yes Young, Clinton D, MD  pantoprazole (PROTONIX) 40 MG tablet TAKE 1 TABLET (40 MG TOTAL) BY MOUTH TWICE A DAY BEFORE MEALS 11/04/22  Yes Monica Becton, MD  predniSONE (DELTASONE) 10 MG tablet Take 1 tablet (10 mg total) by mouth in the morning and at bedtime. 10/30/22  Yes Young, Joni Fears D, MD  tamsulosin (FLOMAX) 0.4 MG CAPS capsule Take 0.4 mg by mouth at bedtime.   Yes [provider]  diltiazem (CARDIZEM) 30 MG tablet Take 1 tablet (30 mg total) by mouth as needed (heart rate over 120). 10/25/22   Jake Bathe, MD  furosemide (LASIX) 40 MG tablet Take 1 tablet (40 mg total) by mouth 2 (two) times daily. As directed Patient not taking: Reported on 10/31/2022 09/13/22   Monica Becton, MD  Nebulizers (COMPRESSOR/NEBULIZER) MISC Use as directed 12/01/17   Waymon Budge, MD  potassium chloride SA (KLOR-CON M20) 20 MEQ tablet Take 1 tablet (20 mEq total) by mouth daily. 09/05/21   Jake Bathe, MD  pregabalin (LYRICA) 75 MG capsule Take 1 capsule (75 mg total) by mouth 3  (three) times daily. Patient not taking: Reported on 11/06/2022 05/16/22   Monica Becton, MD  valACYclovir (VALTREX) 1000 MG tablet Take 1 tablet (1,000 mg total) by mouth 2 (two) times daily. Patient not taking: Reported on 10/31/2022 05/16/22   Monica Becton, MD     Positive ROS: All other systems have been reviewed and were otherwise negative with the exception of those mentioned in the HPI and as above.  Physical Exam:  Vitals:   11/07/22 1345 11/07/22 1350  BP: (!) 144/88 (!) 145/100  Pulse: 69 86  Resp: 20 19  Temp:    SpO2: 95% 95%   General: Alert, no acute distress Cardiovascular: No pedal edema Respiratory: No cyanosis, no use of accessory musculature GI: No organomegaly, abdomen is soft and non-tender Skin: No lesions in the area of chief complaint Neurologic: Sensation intact distally Psychiatric: Patient is competent for consent with normal mood and affect Lymphatic: No axillary or cervical lymphadenopathy  MUSCULOSKELETAL: Left foot demonstrates significant fat pad atrophy with hammertoes.  He has a significantly prominent third metatarsal head with underlying callus.  Tender to palpation there.  Toe is not passively correctable.  Assessment: Left third hammertoe with prominent third metatarsal head plantarly   Plan: Plan for third MTP capsulotomy with third Weil osteotomy to elevate and shorten the toe to offload the plantar forefoot..  We discussed the risks, benefits and alternatives of  surgery which include but are not limited to wound healing complications, infection, nonunion, malunion, need for further surgery, damage to surrounding structures and continued pain.  They understand there is no guarantees to an acceptable outcome.  After weighing these risks they opted to proceed with surgery.     Terance Hart, MD    11/07/2022 2:13 PM

## 2022-11-08 NOTE — Op Note (Signed)
Levi [name] male 77 y.o. 11/07/2022  PreOperative Diagnosis: Left third MTP joint angular deformity Left third metatarsal metatarsalgia  PostOperative Diagnosis: Left third MTP joint angular deformity Left third metatarsal metatarsalgia Left third MTP joint dislocation  PROCEDURE: Left third MTP joint MTP capsulotomy with angular correction Left third metatarsal Weil osteotomy Open reduction left third MTP joint  SURGEON: Dub Mikes, MD  ASSISTANT: None  ANESTHESIA: General with nerve block  FINDINGS: See below  IMPLANTS: Arthrex 2.0 millimeter screw  INDICATIONS:76 y.o. male has significant forefoot deformity and had developed a plantar callus that was intractable.  He had significant forefoot pain and metatarsalgia with dislocation of the third MTP joint.  The toe was irreducible.  He was indicated for surgery due to failure of conservative treatment.   Patient understood the risks, benefits and alternatives to surgery which include but are not limited to wound healing complications, infection, nonunion, malunion, need for further surgery as well as damage to surrounding structures. They also understood the potential for continued pain in that there were no guarantees of acceptable outcome After weighing these risks the patient opted to proceed with surgery.  PROCEDURE: Patient was identified in the preoperative holding area.  The left foot was marked by myself.  Consent was signed by myself and the patient.  Block was performed by anesthesia in the preoperative holding area.  Patient was taken to the operative suite and placed supine on the operative table.  General anesthesia was induced without difficulty. Bump was placed under the operative hip and bone foam was used.  All bony prominences were well padded.   Preoperative antibiotics were given. The extremity was prepped and draped in the usual sterile fashion and surgical timeout was performed.  4 inch Esmarch  tourniquet was placed about the ankle.  We began by making a longitudinal incision overlying the third MTP joint.  The incision was carried sharply through skin and subcutaneous tissue.  The extensor tendon was identified and lengthened.  The extensor brevis tendon was transected.  The MTP joint was identified and there was dislocation of the MTP joint.  An incision was made overlying the capsular tissue at the MTP joint and capsulotomy was performed.  We were unable to reduce the second MTP joint due to contractures.  The capsular release was performed however there was still an irreducible joint.  We then proceeded with Weil osteotomy.  The distal aspect of the third metatarsal was elevated into the wound and using a sagittal saw an oblique cut was made from the dorsal aspect of the third metatarsal head through the shaft of the bone.  Then a wedge was removed allowing for elevation of the third metatarsal head.  Then the osteotomy site was stabilized with pointed reduction clamp and a 2 oh screw was placed across the osteotomy to stabilize it.  On the plantar foot there was significant reduction in the prominence of the third metatarsal head.  We then proceeded with open reduction of the third MTP joint.  Given that the metatarsal was shortened and the capsular tissue had been released the metatarsal phalangeal joint was reducible.  Manipulation was performed to reduce the joint.  It stayed reduced without needing internal fixation.  The wounds then irrigated with normal saline.  The soft tissue was closed with a 2-0 Vicryl, 3-0 Monocryl and 3-0 nylon suture.  Dressing was placed.  Tourniquet was released.  Patient was awakened from anesthesia and taken recovery in stable condition.  No complications.  POST OPERATIVE INSTRUCTIONS: Heel weightbearing in a postoperative shoe Keep dressing in place Follow-up in 2 weeks for nonweightbearing x-rays and suture removal if appropriate.  TOURNIQUET TIME:  Less than 1 hour  BLOOD LOSS:  Minimal         DRAINS: none         SPECIMEN: none       COMPLICATIONS:  * No complications entered in OR log *         Disposition: PACU - hemodynamically stable.         Condition: stable

## 2022-11-12 ENCOUNTER — Ambulatory Visit (INDEPENDENT_AMBULATORY_CARE_PROVIDER_SITE_OTHER): Payer: Medicare Other | Admitting: Sports Medicine

## 2022-11-12 ENCOUNTER — Encounter: Payer: Self-pay | Admitting: Sports Medicine

## 2022-11-12 ENCOUNTER — Ambulatory Visit (INDEPENDENT_AMBULATORY_CARE_PROVIDER_SITE_OTHER): Payer: Medicare Other

## 2022-11-12 VITALS — BP 129/86 | HR 100

## 2022-11-12 DIAGNOSIS — R112 Nausea with vomiting, unspecified: Secondary | ICD-10-CM | POA: Diagnosis not present

## 2022-11-12 HISTORY — DX: Nausea with vomiting, unspecified: R11.2

## 2022-11-12 MED ORDER — ONDANSETRON 8 MG PO TBDP
8.0000 mg | ORAL_TABLET | Freq: Three times a day (TID) | ORAL | 3 refills | Status: DC | PRN
Start: 2022-11-12 — End: 2023-03-14

## 2022-11-12 MED ORDER — PROCHLORPERAZINE MALEATE 10 MG PO TABS
10.0000 mg | ORAL_TABLET | Freq: Four times a day (QID) | ORAL | 3 refills | Status: DC | PRN
Start: 2022-11-12 — End: 2023-03-14

## 2022-11-12 NOTE — Progress Notes (Signed)
    Procedures performed today:    None.  Independent interpretation of notes and tests performed by another provider:   None.  Brief History, Exam, Impression, and Recommendations:    Nausea and vomiting Pleasant 77 year old male, he had foot surgery approximately 5 days ago, after the operation he started to feel some nausea, he also developed some vomiting, a couple times per day, nonbilious, no hematemesis. No diarrhea. He has also noted low-grade fevers, 99 degrees after Tylenol. Minimal abdominal discomfort. He feels dizzy when standing. I think he likely is dealing with a postoperative ileus versus a viral gastroenteritis picked up during his hospitalization for his surgery. Considering the vomiting we do need to get some labs. We will hold Lasix for now. Adding Zofran, Compazine. Abdominal x-rays. He will hydrate aggressively with electrolyte containing beverages such as Gatorade, he can do 64 ounces per day. Return to see me or at least touch base with me end of the week.  I spent 30 minutes of total time managing this patient today, this includes chart review, face to face, and non-face to face time.  ____________________________________________ [name]. [name], M.D., ABFM., CAQSM., AME. Primary Care and Sports Medicine [facility]  Adjunct Professor of Family Medicine  [employer]  Restaurant manager, fast food

## 2022-11-12 NOTE — Assessment & Plan Note (Addendum)
Pleasant 77 year old male, he had foot surgery approximately 5 days ago, after the operation he started to feel some nausea, he also developed some vomiting, a couple times per day, nonbilious, no hematemesis. No diarrhea. He has also noted low-grade fevers, 99 degrees after Tylenol. Minimal abdominal discomfort. He feels dizzy when standing. I think he likely is dealing with a postoperative ileus versus a viral gastroenteritis picked up during his hospitalization for his surgery. Considering the vomiting we do need to get some labs. We will hold Lasix for now. Adding Zofran, Compazine. Abdominal x-rays. He will hydrate aggressively with electrolyte containing beverages such as Gatorade, he can do 64 ounces per day. Return to see me or at least touch base with me end of the week.

## 2022-11-13 ENCOUNTER — Encounter: Payer: Self-pay | Admitting: Sports Medicine

## 2022-11-13 LAB — COMPREHENSIVE METABOLIC PANEL
AG Ratio: 1.6 (calc) (ref 1.0–2.5)
ALT: 16 U/L (ref 9–46)
AST: 22 U/L (ref 10–35)
Albumin: 3.6 g/dL (ref 3.6–5.1)
Alkaline phosphatase (APISO): 161 U/L — ABNORMAL HIGH (ref 35–144)
BUN: 15 mg/dL (ref 7–25)
CO2: 26 mmol/L (ref 20–32)
Calcium: 8.6 mg/dL (ref 8.6–10.3)
Chloride: 104 mmol/L (ref 98–110)
Creat: 0.72 mg/dL (ref 0.70–1.28)
Globulin: 2.3 g/dL (calc) (ref 1.9–3.7)
Glucose, Bld: 72 mg/dL (ref 65–99)
Potassium: 4.1 mmol/L (ref 3.5–5.3)
Sodium: 140 mmol/L (ref 135–146)
Total Bilirubin: 0.6 mg/dL (ref 0.2–1.2)
Total Protein: 5.9 g/dL — ABNORMAL LOW (ref 6.1–8.1)

## 2022-11-13 LAB — CBC WITH DIFFERENTIAL/PLATELET
Absolute Monocytes: 980 cells/uL — ABNORMAL HIGH (ref 200–950)
Basophils Absolute: 168 cells/uL (ref 0–200)
Basophils Relative: 1.3 %
Eosinophils Absolute: 168 cells/uL (ref 15–500)
Eosinophils Relative: 1.3 %
HCT: 40.6 % (ref 38.5–50.0)
Hemoglobin: 13.3 g/dL (ref 13.2–17.1)
Lymphs Abs: 890 cells/uL (ref 850–3900)
MCH: 30.2 pg (ref 27.0–33.0)
MCHC: 32.8 g/dL (ref 32.0–36.0)
MCV: 92.3 fL (ref 80.0–100.0)
MPV: 12.7 fL — ABNORMAL HIGH (ref 7.5–12.5)
Monocytes Relative: 7.6 %
Neutro Abs: 10694 cells/uL — ABNORMAL HIGH (ref 1500–7800)
Neutrophils Relative %: 82.9 %
Platelets: 198 10*3/uL (ref 140–400)
RBC: 4.4 10*6/uL (ref 4.20–5.80)
RDW: 15.1 % — ABNORMAL HIGH (ref 11.0–15.0)
Total Lymphocyte: 6.9 %
WBC: 12.9 10*3/uL — ABNORMAL HIGH (ref 3.8–10.8)

## 2022-11-13 LAB — URINALYSIS W MICROSCOPIC + REFLEX CULTURE
Bilirubin Urine: NEGATIVE
Glucose, UA: NEGATIVE
Hgb urine dipstick: NEGATIVE
Hyaline Cast: NONE SEEN /LPF
Ketones, ur: NEGATIVE
Leukocyte Esterase: NEGATIVE
Nitrites, Initial: NEGATIVE
Protein, ur: NEGATIVE
RBC / HPF: NONE SEEN /HPF (ref 0–2)
Specific Gravity, Urine: 1.014 (ref 1.001–1.035)
Squamous Epithelial / HPF: NONE SEEN /HPF (ref <=5)
WBC, UA: NONE SEEN /HPF (ref 0–5)
pH: 6.5 (ref 5.0–8.0)

## 2022-11-13 LAB — LIPASE: Lipase: 10 U/L (ref 7–60)

## 2022-11-13 LAB — NO CULTURE INDICATED

## 2022-11-14 ENCOUNTER — Encounter: Payer: Self-pay | Admitting: Sports Medicine

## 2022-11-18 ENCOUNTER — Encounter: Payer: Self-pay | Admitting: Sports Medicine

## 2022-11-18 ENCOUNTER — Encounter: Payer: Self-pay | Admitting: Physician Assistant

## 2022-11-18 ENCOUNTER — Ambulatory Visit (INDEPENDENT_AMBULATORY_CARE_PROVIDER_SITE_OTHER): Payer: Medicare Other | Admitting: Physician Assistant

## 2022-11-18 ENCOUNTER — Ambulatory Visit (INDEPENDENT_AMBULATORY_CARE_PROVIDER_SITE_OTHER): Payer: Medicare Other

## 2022-11-18 VITALS — BP 159/79 | HR 86 | Ht 74.0 in | Wt 200.0 lb

## 2022-11-18 DIAGNOSIS — I82812 Embolism and thrombosis of superficial veins of left lower extremities: Secondary | ICD-10-CM

## 2022-11-18 DIAGNOSIS — R2242 Localized swelling, mass and lump, left lower limb: Secondary | ICD-10-CM | POA: Diagnosis not present

## 2022-11-18 DIAGNOSIS — I82813 Embolism and thrombosis of superficial veins of lower extremities, bilateral: Secondary | ICD-10-CM | POA: Insufficient documentation

## 2022-11-18 NOTE — Progress Notes (Signed)
No DVT but you did have a superficial venous thrombosis that is likely causing pain and some swelling. Make sure you are not missing any of your eliquis dosing, add compression all the way to knee on left leg, consider warm compression and keep moving as much as you can. Keep follow up with Dr. Karie Schwalbe in one week.

## 2022-11-18 NOTE — Telephone Encounter (Signed)
Patient scheduled.

## 2022-11-18 NOTE — Progress Notes (Signed)
Acute Office Visit  Subjective:     Patient ID: Levi [name], male    DOB: 1946-02-20, 77 y.o.   MRN: [id number]  No chief complaint on file.   HPI Patient is in today for left lower extremity swelling worsening. Pt has hx of A.fib, CHF, HTN, COPD, peripheral neuropathy and bilateral lower ext swelling.   11/07/2022 pt had a left 3rd toe osteotomy. Since then his left leg has been aching and swelling more. Pt is on eliquis and taking regularly per patient. No change in SOB. He recently had some nausea/vomiting/dizziness and saw PCP. His lasix was decreased to 40mg  during this time. He is feeling better as far as virus.   .. Active Ambulatory Problems    Diagnosis Date Noted   Asthma, moderate persistent 03/06/2009   Seasonal and perennial allergic rhinitis 07/23/2010   Osteoarthritis of left knee 01/10/2012   Atrial fibrillation (HCC) 05/11/2012   History of colonic polyps 02/09/2013   Hyperlipidemia    Pseudoarthrosis of lumbar spine 06/15/2013   Palpitation 03/28/2014   Thrush of mouth and esophagus (HCC) 04/03/2014   Primary osteoarthritis of right knee 06/17/2014   History of total right hip arthroplasty 07/31/2015   Lumbar stenosis with neurogenic claudication 04/07/2017   Pneumothorax, left 04/11/2017   GERD (gastroesophageal reflux disease) status post Nissen fundoplication 12/03/2017   Chest wall pain 09/21/2019   COPD (chronic obstructive pulmonary disease) (HCC) 03/21/2020   Cellulitis 03/22/2020   Arthritis of carpometacarpal (CMC) joint of right thumb 08/07/2020   Peripheral neuropathy 02/22/2021   Chronic systolic heart failure (HCC) 02/22/2021   Chronic anticoagulation 03/15/2021   Cervical radicular pain 06/11/2021   Essential (primary) hypertension 11/15/2020   Heartburn    Atypical chest pain    Lower extremity edema 08/08/2021   Annual physical exam 01/22/2022   Charcot's joint of foot, right 01/22/2022   BPH (benign prostatic hyperplasia) 02/21/2022    Orthostatic hypotension 03/21/2022   Olecranon bursitis, right elbow 04/04/2022   Vertigo 04/04/2022   Shingles 05/16/2022   Primary osteoarthritis of right shoulder, status post left shoulder arthroplasty 07/05/2022   Venous stasis ulcer (HCC) 09/13/2022   Nausea and vomiting 11/12/2022   Resolved Ambulatory Problems    Diagnosis Date Noted   VIRAL URI 07/05/2009   BRONCHITIS, ACUTE 11/05/2007   Intrinsic asthma, unspecified 12/14/2007   INTRINSIC ASTHMA, WITH EXACERBATION 02/07/2010   Allergic bronchopulmonary aspergillosis (HCC) 11/01/2008   Cough 11/30/2007   HEMOPTYSIS UNSPECIFIED 04/24/2010   CHEST PAIN, ATYPICAL 11/15/2008   SKIN RASH 06/25/2010   Rash and nonspecific skin eruption 12/24/2010   Acute URI 04/11/2011   Shortness of breath    COPD (chronic obstructive pulmonary disease) (HCC)    Acute bronchitis 08/10/2013   Rectal bleeding 01/15/2015   Pneumothorax on left 04/11/2017   Peripheral arterial disease (HCC) 06/07/2022   Past Medical History:  Diagnosis Date   Agent orange exposure 1970's   ALLERGIC RHINITIS    Allergy    Asthma    Bruises easily    Cataract    CHF (congestive heart failure) (HCC)    Chronic bronchitis (HCC)    Clotting disorder (HCC) 2019   Degenerative disk disease    Diverticulitis    Dysrhythmia    Elevated uric acid in blood    Emphysema of lung (HCC)    Enlarged prostate    H/O hiatal hernia    Headache    History of pneumonia    Hypothyroidism    Joint  pain    Joint swelling    Neuromuscular disorder (HCC)    Osteoporosis 2012   PAF (paroxysmal atrial fibrillation) (HCC)    Personal history of colonic adenomas 02/09/2013   Pneumonia    Shortness of breath dyspnea    Thyroid disease      ROS See HPI.      Objective:    There were no vitals taken for this visit. BP Readings from Last 3 Encounters:  11/18/22 (!) 159/79  11/12/22 129/86  11/07/22 (!) 152/84   Wt Readings from Last 3 Encounters:  11/18/22  200 lb (90.7 kg)  11/07/22 193 lb (87.5 kg)  10/25/22 193 lb 3.2 oz (87.6 kg)      Physical Exam Constitutional:      Appearance: Normal appearance. He is obese.  HENT:     Head: Normocephalic.  Cardiovascular:     Rate and Rhythm: Normal rate.  Pulmonary:     Effort: Pulmonary effort is normal.  Musculoskeletal:     Right lower leg: Edema present.     Left lower leg: Edema present.     Comments: Right lower leg 1+ pitting edema.  Left lower leg 2+ pitting edema with some slight tenderness to palpation over left calf.  Left foot in post op show and ace wrap over ankle.   Neurological:     General: No focal deficit present.     Mental Status: He is alert and oriented to person, place, and time.  Psychiatric:        Mood and Affect: Mood normal.          Assessment & Plan:  .Marland KitchenJohn was seen today for leg swelling.  Diagnoses and all orders for this visit:  Localized swelling of left lower leg -     US Venous Img Lower Unilateral Left; Future  Superficial thrombosis of leg, left   STAT ultrasound of left leg that showed superficial thrombosis of greater saphenous vein.  Pt will be encouraged to continue eliquis and not miss any doses. He was recently taken off eliquis to have foot surgery and has been less mobile.  Encouraged compression(with socks or ace wrap) up to knee and stay mobile and active.  Warm compresses of left calf.  Keep feet elevated when you are seated Increase lasix to 60mg  1 and 1/2 tablet.  Follow up with Dr. Karie Schwalbe your PCP in 1 week.    Tandy Gaw, PA-C

## 2022-11-18 NOTE — Patient Instructions (Signed)
Keep feet elevated Get ultrasound today Increase lasix to 1 and 1/2 tablet Consider ace wrap further up leg

## 2022-11-20 DIAGNOSIS — S93125A Dislocation of metatarsophalangeal joint of left lesser toe(s), initial encounter: Secondary | ICD-10-CM | POA: Diagnosis not present

## 2022-11-25 ENCOUNTER — Ambulatory Visit (INDEPENDENT_AMBULATORY_CARE_PROVIDER_SITE_OTHER): Payer: Medicare Other

## 2022-11-25 ENCOUNTER — Ambulatory Visit (INDEPENDENT_AMBULATORY_CARE_PROVIDER_SITE_OTHER): Payer: Medicare Other | Admitting: Sports Medicine

## 2022-11-25 ENCOUNTER — Other Ambulatory Visit: Payer: Self-pay | Admitting: Sports Medicine

## 2022-11-25 DIAGNOSIS — R2241 Localized swelling, mass and lump, right lower limb: Secondary | ICD-10-CM | POA: Diagnosis not present

## 2022-11-25 DIAGNOSIS — L97202 Non-pressure chronic ulcer of unspecified calf with fat layer exposed: Secondary | ICD-10-CM | POA: Diagnosis not present

## 2022-11-25 DIAGNOSIS — I82813 Embolism and thrombosis of superficial veins of lower extremities, bilateral: Secondary | ICD-10-CM | POA: Diagnosis not present

## 2022-11-25 DIAGNOSIS — M79604 Pain in right leg: Secondary | ICD-10-CM | POA: Diagnosis not present

## 2022-11-25 DIAGNOSIS — M7989 Other specified soft tissue disorders: Secondary | ICD-10-CM | POA: Diagnosis not present

## 2022-11-25 DIAGNOSIS — I83002 Varicose veins of unspecified lower extremity with ulcer of calf: Secondary | ICD-10-CM

## 2022-11-25 DIAGNOSIS — R112 Nausea with vomiting, unspecified: Secondary | ICD-10-CM

## 2022-11-25 MED ORDER — BISACODYL 5 MG PO TBEC
5.0000 mg | DELAYED_RELEASE_TABLET | Freq: Every day | ORAL | 0 refills | Status: DC | PRN
Start: 2022-11-25 — End: 2022-12-24

## 2022-11-25 MED ORDER — BISACODYL 10 MG RE SUPP
10.0000 mg | RECTAL | 0 refills | Status: DC | PRN
Start: 2022-11-25 — End: 2022-12-24

## 2022-11-25 MED ORDER — AMOXICILLIN-POT CLAVULANATE 875-125 MG PO TABS
1.0000 | ORAL_TABLET | Freq: Two times a day (BID) | ORAL | 0 refills | Status: DC
Start: 2022-11-25 — End: 2023-03-14

## 2022-11-25 MED ORDER — ALLOPURINOL 100 MG PO TABS: 100.0000 mg | ORAL_TABLET | Freq: Every morning | ORAL | 3 refills | Status: DC

## 2022-11-25 NOTE — Assessment & Plan Note (Signed)
Continues to have venous stasis, I really do think he needs to wear his compression stockings. [facility]

## 2022-11-25 NOTE — Progress Notes (Addendum)
    Procedures performed today:    None.  Independent interpretation of notes and tests performed by another provider:   None.  Brief History, Exam, Impression, and Recommendations:    Nausea and vomiting Levi [name] returns, we saw him approximately 2 weeks ago with nausea and vomiting. He had had foot surgery approximately 5 days prior to the last visit, he felt some nausea after the operation. Developed some vomiting. He also had low-grade temperatures, minimal abdominal discomfort and dizziness. I suspected a postoperative ileus versus gastroenteritis. We held his Lasix due to dizziness, nausea and vomiting, added Zofran and Compazine which has helped a bit. Labs were for the most part unrevealing with the exception of a minimally elevated white blood cell count, abdominal x-rays did show a large stool burden. He is stooling but only minimally. Otherwise appears well. He has been hydrating, doing MiraLAX and Senokot-S without improvement. We will stop the MiraLAX and Senokot-S, and we will restart with Dulcolax suppositories and Dulcolax pills. As he did have an elevated white count, bacteria in his urine and persistence of his nausea for 2 weeks we will go ahead and add some antibiotics in the form of Augmentin. Keep close follow-up in about 2 weeks.  Venous stasis ulcer (HCC) Continues to have venous stasis, I really do think he needs to wear his compression stockings. [facility] with [facility]  Chronic superficial venous thrombosis of both lower extremities Superficial venous thrombosis noted left lower extremity, this will resolve on its own. He does have significant right lower extremity swelling so I would like a right lower extremity DVT ultrasound today.  Update: Ultrasound does show superficial venous thrombosis in the right leg as well, warm compresses as well as compression dressings are the key here.  He is on apixaban  already.    ____________________________________________ Levi [name]. Levi [name], M.D., ABFM., CAQSM., AME. Primary Care and Sports Medicine [place] [facility]  Adjunct Professor of Family Medicine  [place] [employer] [facility] [employer]  Restaurant manager, fast food

## 2022-11-25 NOTE — Assessment & Plan Note (Addendum)
Levi [name] returns, we saw him approximately 2 weeks ago with nausea and vomiting. He had had foot surgery approximately 5 days prior to the last visit, he felt some nausea after the operation. Developed some vomiting. He also had low-grade temperatures, minimal abdominal discomfort and dizziness. I suspected a postoperative ileus versus gastroenteritis. We held his Lasix due to dizziness, nausea and vomiting, added Zofran and Compazine which has helped a bit. Labs were for the most part unrevealing with the exception of a minimally elevated white blood cell count, abdominal x-rays did show a large stool burden. He is stooling but only minimally. Otherwise appears well. He has been hydrating, doing MiraLAX and Senokot-S without improvement. We will stop the MiraLAX and Senokot-S, and we will restart with Dulcolax suppositories and Dulcolax pills. As he did have an elevated white count, bacteria in his urine and persistence of his nausea for 2 weeks we will go ahead and add some antibiotics in the form of Augmentin. Keep close follow-up in about 2 weeks.

## 2022-11-25 NOTE — Assessment & Plan Note (Addendum)
Superficial venous thrombosis noted left lower extremity, this will resolve on its own. He does have significant right lower extremity swelling so I would like a right lower extremity DVT ultrasound today.  Update: Ultrasound does show superficial venous thrombosis in the right leg as well, warm compresses as well as compression dressings are the key here.  He is on apixaban already.

## 2022-12-10 ENCOUNTER — Ambulatory Visit (INDEPENDENT_AMBULATORY_CARE_PROVIDER_SITE_OTHER): Payer: Medicare Other | Admitting: Sports Medicine

## 2022-12-10 ENCOUNTER — Encounter: Payer: Self-pay | Admitting: Sports Medicine

## 2022-12-10 VITALS — BP 137/81 | HR 89 | Ht 74.0 in | Wt 196.0 lb

## 2022-12-10 DIAGNOSIS — R112 Nausea with vomiting, unspecified: Secondary | ICD-10-CM

## 2022-12-10 DIAGNOSIS — I82813 Embolism and thrombosis of superficial veins of lower extremities, bilateral: Secondary | ICD-10-CM

## 2022-12-10 NOTE — Assessment & Plan Note (Signed)
Levi [name] returns, he is a pleasant 77 year old male, he is here for follow-up of his leg swelling, we initially did bilateral ultrasounds, he had bilateral superficial venous thromboses below the knee, he is on apixaban, he is wearing his compression sleeves, he had [facility]. He is actually doing a lot better, he still has some swelling, still has the typical mid lower leg hemosiderin deposits, he is wearing his compression stockings, pain is a lot better, negative Denna Haggard' sign bilaterally. We will watch this for now.

## 2022-12-10 NOTE — Progress Notes (Signed)
    Procedures performed today:    None.  Independent interpretation of notes and tests performed by another provider:   None.  Brief History, Exam, Impression, and Recommendations:    Chronic superficial venous thrombosis of both lower extremities Levi [name] returns, he is a pleasant 77 year old male, he is here for follow-up of his leg swelling, we initially did bilateral ultrasounds, he had bilateral superficial venous thromboses below the knee, he is on apixaban, he is wearing his compression sleeves, he had some [facility]. He is actually doing a lot better, he still has some swelling, still has the typical mid lower leg hemosiderin deposits, he is wearing his compression stockings, pain is a lot better, negative Denna Haggard' sign bilaterally. We will watch this for now.  Nausea and vomiting Levi [name] also had some nausea and vomiting, we have been treating him for approximately 2 to 3 weeks, he had foot surgery 5 days prior to the nausea and vomiting, he had low-grade temperatures, abdominal discomfort, dizziness, we initially suspected a postoperative ileus versus viral gastroenteritis, we held his Lasix, added Zofran and Compazine, labs did show elevated white cell count, abdominal x-rays did show large stool burden. We had him hydrate, added MiraLAX, Dulcolax suppositories, Dulcolax pills, continued his antiemetics, added Augmentin and symptoms resolved.  I spent 30 minutes of total time managing this patient today, this includes chart review, face to face, and non-face to face time.  ____________________________________________ [name]. [name], M.D., ABFM., CAQSM., AME. Primary Care and Sports Medicine  [facility]  Adjunct Professor of Family Medicine  [place] [employer]  Restaurant manager, fast food

## 2022-12-10 NOTE — Assessment & Plan Note (Signed)
Levi [name] also had some nausea and vomiting, we have been treating him for approximately 2 to 3 weeks, he had foot surgery 5 days prior to the nausea and vomiting, he had low-grade temperatures, abdominal discomfort, dizziness, we initially suspected a postoperative ileus versus viral gastroenteritis, we held his Lasix, added Zofran and Compazine, labs did show elevated white cell count, abdominal x-rays did show large stool burden. We had him hydrate, added MiraLAX, Dulcolax suppositories, Dulcolax pills, continued his antiemetics, added Augmentin and symptoms resolved.

## 2022-12-23 NOTE — Progress Notes (Unsigned)
No chief complaint on file.   HISTORY OF PRESENT ILLNESS:  12/23/22 ALL:  Levi [name] returns for follow up for neuropathy. He was last seen 05/2022 and reported discoloration of left foot. Pulses found with doppler. He was seen by PCP the next day and sent for ABI. He was referred to [name], vascular. Normal blood flow felt to be present. He was advised to wear compression stockings and follow up as needed.     06/06/2022 ALL: Levi [name] returns for follow up for neuropathy. He continues duloxetine 30mg  daily. He reports neuropathy is stable. No significant worsening. He is tolerating duloxetine.   He had a fall in 12/2021 and referred to PT. He reports going a few times but did not feel it was helpful. He reports continued falls. He uses four prong cane. He has no feeling of bottoms of feet. He reports being diagnosed with Charcot foot. He has a walker but doesn't like to use it.   He tells me that he first noticed discoloration around the base of toes of left foot about 3 weeks ago. No falls or known injury to cause bruising. He denies pain.   12/04/2021 ALL: Levi [name] returns for follow up for neuropathy. He was last seen by Levi Levi [name] 08/2021 and reported more falls and gait instability. He was tolerating duloxetine 60mg  daily but felt that 30mg  worked better and dose was decreased. He feels that neuropathy seems stable. He has occasional burning. Neuropathy cream did not help much.   He reports that dizziness/lightheadedness has been a little worse over the past 6-8 weeks. Most of the time symptoms are consistent with standing from seated position. BP is normally "lower". No syncope. He "falls all the time". Mostly when not using He denies any significant injuries since last visit. He uses his four prong cane. Rarely uses walker. He wears compression stocking most of the time. He admits that he has not been drinking as much water recently. Lasix dose was increased to 40mg  end of May.   He did undergo  robotic nissen fundoplication 11/01/2021. He was initially recovering well but developed more dysphagia about a week ago. He reports spitting up a little blood. Medrol taper did not help much. He is being scheduled for UGI this week. He last noticed blood tinged sputum about 2 days ago.   He continues to follow closely with cardiology for CHF. He lives with his wife. He is able to drive.   04/09/2021 ALL:  Levi [name] returns for follow up. We increased gabapentin to 300mg  TID and started compounded neuropathy cream at last visit 11/2020. He stopped gabapentin as it was not effective. He reports that [facility] called him but never sent cream. He does not wish to resend orders. He reports neuropathy pain waxes and wanes. He continues to have regular falls. Fortunately, no injuries. He uses a cane most all the time except for when he is at home. Falls have occurred when he is not using his cane. He has a walker but doesn't like to use it. PT has not been helpful. He continues to see Levi [name]. He is scheduled to return 05/09/2021 to discuss treatment for broken screw in his neck. He denies syncope since last being seen. Only one true syncopal event when seen by Levi Levi [name]. He has had 1-2 near syncopal events, last event was 2 weeks ago. Symptoms were very short lived, a minute or so. He reports blurry vision while driving. He felt that everything was slanted.  He denies double vision. He states BP has been fluctuating from 80/60's to 150's/80's. He occasionally has dizziness with standing. He is on multiple cardiac medications. He is s/p failed watchman procedure. Last saw cardiology 10/20 who advised to continue Eliquis.   12/20/2020 ALL:  Levi [name] is a 77 y.o. male here today for follow up for bilateral leg pain. NCS/EMG testing shows chronic neuropathy. He was started on gabapentin 100mg  daily and advised to titrate to TID dosing. He has taken 300mg  daily at bedtime. He is uncertain if medicaiton as  been helpful. He states that he was previously on gabapentin 300mg  TID and that seemed to work better. Burning pain continues to be fairly constant. Worse with activity and at night. He continues to see [facility] for nerve blocks and ESI. He is beings scheduled for an MRI of cervical spine in the next couple of week. He has severe arthritis of right foot. He was seen by podiatry and told it would take multiple surgeries over 5-6 years. He is on prednisone. He has had a total of 43 surgeries. He is currently being considered for insertion of Watchman.   HISTORY (copied from Levi [name] previous note)  Levi [name] is a 77 year old right-handed gentleman with an underlying  Complex medical history of asthma, A. fib, hyperlipidemia, reflux disease with hiatal hernia, diverticulitis, degenerative disc disease, COPD, allergic rhinitis, history of agent orange exposure in the 70s, carpal tunnel syndrome, status post bilateral carpal tunnel release and ulnar release surgeries in January and February of this year, status post multiple surgeries including lumbar spine surgery, neck surgery, bilateral hip replacements, bilateral knee replacements, shoulder surgeries, who Presents for follow-up consultation of his neuropathy.  I saw him at the request of Levi. Danielle [name] on 10/21/2019, at which time the patient reported a longstanding history of numbness and tingling and pain in both lower extremities.  Symptoms were more on the right.   He had extensive blood work on 10/21/2019.  A1c was 5.5, ANA was negative, CRP was less than 1, rheumatoid factor was less than 10, CK was 59, heavy metals in the blood was negative, CMP showed glucose of 115, BUN 27, creatinine 0.88, AST 19, ALT 15, TSH was mildly elevated at 5.33, multiple myeloma panel showed decreased in immunoglobulins with a pattern suggestive of hypogammaglobulinemia, RPR nonreactive, vitamin B12 low at 224, thiamine 206.2.  He was advised to seek consultation  with hematology.  He was furthermore advised to follow-up with his primary care for B12 deficiency and abnormal TSH.   He had an EMG and nerve conduction velocity test through our office on 11/17/2019 and I reviewed the results:   Conclusion: This is an abnormal study.  There is electrodiagnostic evidence of mild bilateral lumbosacral radiculopathy, mainly involving bilateral L4, L5, S1 myotomes, there is no evidence of active process.  In addition, there is evidence of mild length dependent axonal sensorimotor polyneuropathy.   We called him with his test results.  He saw hematology in July 2021, hypogammaglobulinemia was not considered to be significant at the time.  He did not have any history of recurrent infections.  He was not felt to need a bone marrow biopsy.   Today, 09/20/2020: He reports ongoing issues with leg pain, right more than left.  It has not really progressed since last time but he has additional joint pains including both hips, sometimes he takes Flexeril at bedtime.  He does report that his balance is off and he has had  falls, as recent as 2 days ago.  He fell in the yard.  He has a walker but typically only uses his four-point cane.  He tries to hydrate with water.  He reports a recent brief episode of A. fib, he reports that he can tell when he is in A. fib.  He is due for an eye exam.  He had a recent episode of dizziness and lightheadedness and had double vision which lasted about 10 minutes.  He has an eye examination yearly. He had an interim ER visit on 09/13/2020 for dizziness, lightheadedness.  I reviewed the emergency room records.  He had a head CT without contrast on 09/13/2020 and I reviewed the results: IMPRESSION: Mild atrophic changes without acute abnormality.  He was felt to have orthostasis.   He had recent right hand surgery on 08/07/2020 under Levi. Jodi Geralds.  He had right hand ligament reconstruction and tendon interposition.  Pain is improved and mobility is  better since surgery he reports.  He is not taking any oxycodone at this time.  Does take Flexeril as needed, typically only once at bedtime.   REVIEW OF SYSTEMS: Out of a complete 14 system review of symptoms, the patient complains only of the following symptoms, chronic pain, numbness/tingling, and all other reviewed systems are negative.   ALLERGIES: Allergies  Allergen Reactions   Morphine And Codeine Palpitations and Other (See Comments)    Made patient light-headed and sweaty; "my heart races" (04/28/2012), pt states too much morphine gave him this reaction   Levaquin [Levofloxacin] Itching     HOME MEDICATIONS: Outpatient Medications Prior to Visit  Medication Sig Dispense Refill   albuterol (VENTOLIN HFA) 108 (90 Base) MCG/ACT inhaler INHALE 2 PUFFS INTO THE LUNGS EVERY 4 HOURS AS NEEDED FOR WHEEZING OR SHORTNESS OF BREATH. 18 each 12   allopurinol (ZYLOPRIM) 100 MG tablet Take 1 tablet (100 mg total) by mouth in the morning. 90 tablet 3   amoxicillin-clavulanate (AUGMENTIN) 875-125 MG tablet Take 1 tablet by mouth 2 (two) times daily. 20 tablet 0   apixaban (ELIQUIS) 5 MG TABS tablet TAKE 1 TABLET BY MOUTH TWICE A DAY 180 tablet 1   atorvastatin (LIPITOR) 20 MG tablet TAKE 1 TABLET BY MOUTH EVERY DAY 90 tablet 2   bisacodyl (DULCOLAX) 10 MG suppository Place 1 suppository (10 mg total) rectally as needed for moderate constipation. 12 suppository 0   bisacodyl (DULCOLAX) 5 MG EC tablet Take 1 tablet (5 mg total) by mouth daily as needed for moderate constipation. 30 tablet 0   cyclobenzaprine (FLEXERIL) 10 MG tablet Take 1 tablet (10 mg total) by mouth at bedtime. (Patient taking differently: Take 10 mg by mouth daily as needed for muscle spasms.) 90 tablet 1   diclofenac Sodium (VOLTAREN) 1 % GEL Apply 2 g topically 3 (three) times daily as needed (pain.).     diltiazem (CARDIZEM CD) 180 MG 24 hr capsule TAKE 1 CAPSULE BY MOUTH EVERY DAY 90 capsule 3   diltiazem (CARDIZEM) 30 MG  tablet Take 1 tablet (30 mg total) by mouth as needed (heart rate over 120). 30 tablet 6   furosemide (LASIX) 40 MG tablet Take 1 tablet (40 mg total) by mouth 2 (two) times daily. As directed 180 tablet 3   ipratropium-albuterol (DUONEB) 0.5-2.5 (3) MG/3ML SOLN 1 NEBULE EVERY 6 HOURS AS NEEDED. **J45.3** 270 mL 4   isosorbide mononitrate (IMDUR) 30 MG 24 hr tablet TAKE 1 TABLET BY MOUTH EVERY DAY 90 tablet 2  lidocaine (LIDOCANNA) 4 % Place 1 patch onto the skin daily as needed (pain.).     Nebulizers (COMPRESSOR/NEBULIZER) MISC Use as directed 1 each 0   Olodaterol HCl (STRIVERDI RESPIMAT) 2.5 MCG/ACT AERS INHALE 2 PUFFS BY MOUTH INTO THE LUNGS DAILY 4 g 12   ondansetron (ZOFRAN-ODT) 8 MG disintegrating tablet Take 1 tablet (8 mg total) by mouth every 8 (eight) hours as needed for nausea. 20 tablet 3   oxyCODONE (ROXICODONE) 5 MG immediate release tablet Take 1 tablet (5 mg total) by mouth every 4 (four) hours as needed for severe pain. 30 tablet 0   pantoprazole (PROTONIX) 40 MG tablet TAKE 1 TABLET (40 MG TOTAL) BY MOUTH TWICE A DAY BEFORE MEALS 180 tablet 2   potassium chloride SA (KLOR-CON M20) 20 MEQ tablet Take 1 tablet (20 mEq total) by mouth daily. 90 tablet 3   predniSONE (DELTASONE) 10 MG tablet Take 1 tablet (10 mg total) by mouth in the morning and at bedtime. 60 tablet 11   pregabalin (LYRICA) 75 MG capsule Take 1 capsule (75 mg total) by mouth 3 (three) times daily. 90 capsule 3   prochlorperazine (COMPAZINE) 10 MG tablet Take 1 tablet (10 mg total) by mouth every 6 (six) hours as needed for nausea or vomiting. 30 tablet 3   tamsulosin (FLOMAX) 0.4 MG CAPS capsule Take 0.4 mg by mouth at bedtime.     No facility-administered medications prior to visit.     PAST MEDICAL HISTORY: Past Medical History:  Diagnosis Date   Agent orange exposure 1970's   takes Imdur,Diltiazem, and Atacand daily   ALLERGIC RHINITIS    Allergy    seasonal allergies   Asthma    uses inhaler and  nebulizers   Bruises easily    d/t prednisone daily   Cataract    CHF (congestive heart failure) (HCC)    dx-on meds to treat   Chronic bronchitis (HCC)    "used to get it q yr; last time ?2008" (04/28/2012)   Clotting disorder (HCC) 2019   On blood thinner when I bleed it is for a consideral amount of time. This is tegardless of size of wound.   COPD (chronic obstructive pulmonary disease) (HCC)    agent orange exposure   Degenerative disk disease    "everywhere" (04/28/2012)   Diverticulitis    Dysrhythmia    afib   Elevated uric acid in blood    takes Allopurinol daily   Emphysema of lung (HCC)    Enlarged prostate    but not on any meds   GERD (gastroesophageal reflux disease)    takes Protonix daily   H/O hiatal hernia    Headache    History of pneumonia    a. 2010   Hyperlipidemia    takes Lipitor daily; pt. states he takes a preventive   Hypothyroidism    Joint pain    Joint swelling    Nausea and vomiting 11/12/2022   Neuromuscular disorder (HCC)    bilat legs, and bilat arms   Osteoporosis 2012   PAF (paroxysmal atrial fibrillation) (HCC)    a. Dx 04/2012, CHA2DS2VASc = 1 (age);  b. 04/2012 Echo: EF 40-50%, mild MR.   Personal history of colonic adenomas 02/09/2013   Pneumonia    Shortness of breath dyspnea    Thyroid disease    no taking meds-caused dizziness     PAST SURGICAL HISTORY: Past Surgical History:  Procedure Laterality Date   58 HOUR PH STUDY N/A  05/30/2021   Procedure: 24 HOUR PH STUDY;  Surgeon: Shellia Cleverly, DO;  Location: [facility];  Service: Gastroenterology;  Laterality: N/A;   ANTERIOR CERVICAL DECOMP/DISCECTOMY FUSION N/A 07/21/2012   Procedure: ANTERIOR CERVICAL DECOMPRESSION/DISCECTOMY FUSION 2 LEVELS;  Surgeon: Barnett Abu, MD;  Location: [facility];  Service: Neurosurgery;  Laterality: N/A;  C4-5 C5-6 Anterior cervical decompression/diskectomy/fusion   ANTERIOR LAT LUMBAR FUSION N/A 04/07/2017   Procedure: Thoracic  twelve-Lumbar one Anterolateral decompression/fusion;  Surgeon: Barnett Abu, MD;  Location: [facility] OR;  Service: Neurosurgery;  Laterality: N/A;   ARTHRODESIS FOOT WITH WEIL OSTEOTOMY Left 11/07/2022   Procedure: LEFT THIRD METATARSOPHALANGEAL CAPSULOTOMY, WEIL OSTEOTOMY;  Surgeon: Terance Hart, MD;  [facility];  Service: Orthopedics;  Laterality: Left;  LENGTH OF SURGERY: 60 MINUTES   BACK SURGERY     x 3   CARDIAC CATHETERIZATION  11/2008   Levi. Anne Fu - 20% calcified non flow limiting left main, 50% EF apical hypokinesis   CERVICAL DISC ARTHROPLASTY N/A 04/07/2017   Procedure: Cervical six-seven Disc arthroplasty;  Surgeon: Barnett Abu, MD;  Location: [facility] OR;  Service: Neurosurgery;  Laterality: N/A;   CHEST TUBE INSERTION Left 04/11/2017   Procedure: CHEST TUBE INSERTION;  Surgeon: Kerin Perna, MD;  Location: [facility] OR;  Service: Thoracic;  Laterality: Left;   CHOLECYSTECTOMY     COLONOSCOPY  2016   CG-MAC-miralax(good)servere TICS/TA x 2   ESOPHAGEAL MANOMETRY N/A 05/30/2021   Procedure: ESOPHAGEAL MANOMETRY (EM);  Surgeon: Shellia Cleverly, DO;  Location: [facility];  Service: Gastroenterology;  Laterality: N/A;  ph impedence   ESOPHAGOGASTRODUODENOSCOPY     EYE SURGERY Bilateral 2011   steroidal encapsulation" (04/28/2012)   FOOT SURGERY Right    x 2   JOINT REPLACEMENT     Many surgeries including 5 joit replacements   KNEE ARTHROSCOPY Bilateral    LATERAL / POSTERIOR COMBINED FUSION LUMBAR SPINE  2012   LEFT ATRIAL APPENDAGE OCCLUSION N/A 02/22/2021   Procedure: LEFT ATRIAL APPENDAGE OCCLUSION;  Surgeon: Lanier Prude, MD;  [facility]: [facility];  Service: Cardiovascular;  Laterality: N/A;   POLYPECTOMY  2016   severe TICS/TA x 2   POSTERIOR CERVICAL LAMINECTOMY WITH MET- RX Right 06/11/2021   Procedure: Right Cervical six-seven  Laminectomy and foraminotomy with metrex;  Surgeon: Barnett Abu, MD;  Location: [facility] OR;  Service: Neurosurgery;   Laterality: Right;   REVERSE SHOULDER ARTHROPLASTY  04/28/2012   Procedure: REVERSE SHOULDER ARTHROPLASTY;  Surgeon: Mable Paris, MD;  [facility];  Service: Orthopedics;  Laterality: Left;  Left shouder reverse total shoulder arthroplasty   SHOULDER SURGERY     SPINE SURGERY     I have had 7 spinal surgeries   TEE WITHOUT CARDIOVERSION N/A 02/22/2021   Procedure: TRANSESOPHAGEAL ECHOCARDIOGRAM (TEE);  Surgeon: Lanier Prude, MD;  Location: [facility] INVASIVE CV LAB;  Service: Cardiovascular;  Laterality: N/A;   TENDON REPAIR Right 08/07/2020   Procedure: RIGHT HAND LIGAMENT RECONSTRUCTION AND TENDON INTERPOSITION;  Surgeon: Jodi Geralds, MD;  Location: [facility];  Service: Orthopedics;  Laterality: Right;   TONSILLECTOMY AND ADENOIDECTOMY  1954   TOTAL HIP ARTHROPLASTY Left 2011   "left" (04/28/2012)   TOTAL HIP ARTHROPLASTY Right 07/31/2015   Procedure: TOTAL HIP ARTHROPLASTY ANTERIOR APPROACH;  Surgeon: Jodi Geralds, MD;  Location: [facility];  Service: Orthopedics;  Laterality: Right;   TOTAL KNEE ARTHROPLASTY  01/10/2012   Procedure: TOTAL KNEE ARTHROPLASTY;  Surgeon: Harvie Junior, MD;  Location:  [facility] OR;  Service: Orthopedics;;  left total knee arthroplasty   TOTAL KNEE ARTHROPLASTY Right 06/17/2014   Procedure: TOTAL KNEE ARTHROPLASTY;  Surgeon: Harvie Junior, MD;  Location: [facility] OR;  Service: Orthopedics;  Laterality: Right;   toupet fundolplication  11/01/2021     FAMILY HISTORY: Family History  Problem Relation Age of Onset   Diabetes Mother    Heart disease Father        Died 45, MI   Cancer Father    Hyperlipidemia Father    Stroke Father    Prostate cancer Brother    Colon polyps Brother    Colon polyps Brother    Asthma Brother    COPD Brother    Colon cancer Brother        Dx age 25   Cancer Brother    Colon cancer Other    Esophageal cancer Neg Hx    Rectal cancer Neg Hx    Stomach cancer Neg Hx    Pancreatic cancer Neg Hx    Kidney  disease Neg Hx    Liver disease Neg Hx    Neuropathy Neg Hx      SOCIAL HISTORY: Social History   Socioeconomic History   Marital status: Married    Spouse name: Jill Side   Number of children: 1   Years of education: 16   Highest education level: Bachelor's degree (e.g., BA, AB, BS)  Occupational History   Occupation: Retired    Comment: Nurse, 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 Daughter lives two miles away. 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 Enjoys being active and working.   Social Determinants of Health   Financial Resource Strain: Low Risk  (10/02/2022)   Overall Financial Resource Strain (CARDIA)    Difficulty of Paying Living Expenses: Not hard at all  Food Insecurity: No Food Insecurity (10/02/2022)   Hunger Vital Sign    Worried About Running Out of Food in the Last Year: Never true    Ran Out of Food in the Last Year: Never true  Transportation Needs: No Transportation Needs (10/02/2022)   PRAPARE - Administrator, Civil Service (Medical): No    Lack of Transportation (Non-Medical): No  Physical Activity: Sufficiently Active (10/02/2022)   Exercise Vital Sign    Days of Exercise per Week: 7 days    Minutes of Exercise per Session: 60 min  Stress: No Stress Concern Present (10/02/2022)   [employer] of Occupational Health - Occupational Stress Questionnaire    Feeling of Stress : Only a little  Recent Concern: Stress - Stress Concern Present (09/10/2022)   [employer] of Occupational Health - Occupational Stress Questionnaire     Feeling of Stress : To some extent  Social Connections: Socially Integrated (10/03/2022)   Social Connection and Isolation Panel [NHANES]    Frequency of Communication with Friends and Family: More than three times a week    Frequency of Social Gatherings with Friends and Family: Never    Attends Religious Services: More  than 4 times per year    Active Member of Clubs or Organizations: Yes    Attends Banker Meetings: Never    Marital Status: Married  Catering manager Violence: Not At Risk (10/03/2022)   Humiliation, Afraid, Rape, and Kick questionnaire    Fear of Current or Ex-Partner: No    Emotionally Abused: No    Physically Abused: No    Sexually Abused: No     PHYSICAL EXAM  There were no vitals filed for this visit.     There is no height or weight on file to calculate BMI.   Generalized: Well developed, in no acute distress  Cardiology: normal rate and rhythm, no murmur auscultated  Respiratory: clear to auscultation bilaterally    Neurological examination  Mentation: Alert oriented to time, place, history taking. Follows all commands speech and language fluent Cranial nerve II-XII: Pupils were equal round reactive to light. Extraocular movements were full, visual field were full on confrontational test. Facial sensation and strength were normal. Head turning and shoulder shrug  were normal and symmetric. Motor: The motor testing reveals 4-4+ over 5 strength of all 4 extremities with exception of R lower hip flexion and dorsiflexion 4/5.  Sensory: Sensory testing is decreased to soft and pinprick testing of bilateral lower extremities from upper thigh to just below knee, absent from bilateral knees to bottom of feet.  Coordination: Cerebellar testing reveals good finger-nose-finger, unable to perform heel to shin bilaterally Gait and station: He pushes with some difficulty to standing position. Gait is slow and cautious. Stable with cane. Tandem not  attempted.  Reflexes: Deep tendon reflexes are symmetric and normal bilaterally.  Skin: left foot cool in comparison to right, left pedal pulses noted with doppler, area of ecchymosis at base of 2nd, 3rd and 4th toes, no pain reported, no obvious ischemia    DIAGNOSTIC DATA (LABS, IMAGING, TESTING) - I reviewed patient records, labs, notes, testing and imaging myself where available.  Lab Results  Component Value Date   WBC 12.9 (H) 11/12/2022   HGB 13.3 11/12/2022   HCT 40.6 11/12/2022   MCV 92.3 11/12/2022   PLT 198 11/12/2022      Component Value Date/Time   NA 140 11/12/2022 0920   NA 143 08/15/2021 1349   K 4.1 11/12/2022 0920   CL 104 11/12/2022 0920   CO2 26 11/12/2022 0920   GLUCOSE 72 11/12/2022 0920   BUN 15 11/12/2022 0920   BUN 21 08/15/2021 1349   CREATININE 0.72 11/12/2022 0920   CALCIUM 8.6 11/12/2022 0920   PROT 5.9 (L) 11/12/2022 0920   PROT 5.9 (L) 10/21/2019 0941   ALBUMIN 4.0 09/25/2021 1825   ALBUMIN 4.1 10/21/2019 0941   AST 22 11/12/2022 0920   AST 19 02/28/2020 1440   ALT 16 11/12/2022 0920   ALT 15 02/28/2020 1440   ALKPHOS 88 09/25/2021 1825   BILITOT 0.6 11/12/2022 0920   BILITOT 0.6 02/28/2020 1440   GFRNONAA >60 11/07/2022 1038   GFRNONAA >60 02/28/2020 1440   GFRAA >60 02/28/2020 1440   Lab Results  Component Value Date   CHOL 208 (H) 07/05/2022   HDL 112 07/05/2022   LDLCALC 82 07/05/2022   TRIG 65 07/05/2022   CHOLHDL 1.9 07/05/2022   Lab Results  Component Value Date   HGBA1C 5.8 (H) 07/05/2022   Lab Results  Component Value Date   VITAMINB12 539 07/05/2022   Lab Results  Component Value Date   TSH 1.70 07/05/2022  No data to display               No data to display           ASSESSMENT AND PLAN  77 y.o. year old male  has a past medical history of Agent orange exposure (1970's), ALLERGIC RHINITIS, Allergy, Asthma, Bruises easily, Cataract, CHF (congestive heart failure) (HCC), Chronic bronchitis  (HCC), Clotting disorder (HCC) (2019), COPD (chronic obstructive pulmonary disease) (HCC), Degenerative disk disease, Diverticulitis, Dysrhythmia, Elevated uric acid in blood, Emphysema of lung (HCC), Enlarged prostate, GERD (gastroesophageal reflux disease), H/O hiatal hernia, Headache, History of pneumonia, Hyperlipidemia, Hypothyroidism, Joint pain, Joint swelling, Nausea and vomiting (11/12/2022), Neuromuscular disorder (HCC), Osteoporosis (2012), PAF (paroxysmal atrial fibrillation) (HCC), Personal history of colonic adenomas (02/09/2013), Pneumonia, Shortness of breath dyspnea, and Thyroid disease. here with    No diagnosis found.  Levi [name] feels that neuropathy is stable. We will continue duloxetine 30mg  daily. He continues to have some lightheaded and instability. We have discussed multiple contributing factors. I have encouraged him to wear compression stockings, make position changes slowly and focus on adequate hydration. Fall precautions reviewed. He was encouraged to use cane at all times. Consider walker if he needs more stability. We have reached out to PCP regarding concerns discoloration with decreased pedal pulses of left foot. Discoloration appears to be bruising, however, no known injuries. PCP made appt to see him tomorrow, 06/07/2022, at 8:45. Mr Scarpulla agrees to appt. He will continue close follow up with PCP, cardiology and neurosurgery. I will have him return for follow up with me in 6 months. He verbalizes understanding and agreement with this plan.    No orders of the defined types were placed in this encounter.     No orders of the defined types were placed in this encounter.     Shawnie Dapper, MSN, FNP-C 12/23/2022, 8:27 AM  [facility] [address], [address] [place], [place] [address] [phone]

## 2022-12-23 NOTE — Patient Instructions (Signed)
Below is our plan: ° °We will *** ° °Please make sure you are staying well hydrated. I recommend 50-60 ounces daily. Well balanced diet and regular exercise encouraged. Consistent sleep schedule with 6-8 hours recommended.  ° °Please continue follow up with care team as directed.  ° °Follow up with *** in *** ° °You may receive a survey regarding today's visit. I encourage you to leave honest feed back as I do use this information to improve patient care. Thank you for seeing me today!  ° ° °

## 2022-12-24 ENCOUNTER — Encounter: Payer: Self-pay | Admitting: Family Medicine

## 2022-12-24 ENCOUNTER — Ambulatory Visit (INDEPENDENT_AMBULATORY_CARE_PROVIDER_SITE_OTHER): Payer: Medicare Other | Admitting: Family Medicine

## 2022-12-24 VITALS — BP 137/85 | HR 95 | Ht 74.0 in | Wt 194.8 lb

## 2022-12-24 DIAGNOSIS — I739 Peripheral vascular disease, unspecified: Secondary | ICD-10-CM

## 2022-12-24 DIAGNOSIS — G629 Polyneuropathy, unspecified: Secondary | ICD-10-CM

## 2022-12-24 MED ORDER — DULOXETINE HCL 30 MG PO CPEP: 30.0000 mg | ORAL_CAPSULE | Freq: Every day | ORAL | 3 refills | Status: DC

## 2023-01-01 DIAGNOSIS — S93125A Dislocation of metatarsophalangeal joint of left lesser toe(s), initial encounter: Secondary | ICD-10-CM | POA: Diagnosis not present

## 2023-01-01 DIAGNOSIS — M19071 Primary osteoarthritis, right ankle and foot: Secondary | ICD-10-CM | POA: Diagnosis not present

## 2023-01-12 ENCOUNTER — Other Ambulatory Visit: Payer: Self-pay | Admitting: Internal Medicine

## 2023-01-20 IMAGING — CR DG CHEST 2V
2 series · 2 of 2 positions shown · non-contrast
Comparison: None.

CLINICAL DATA: Preop exam

EXAM:
CHEST - 2 VIEW

[w chest pa]
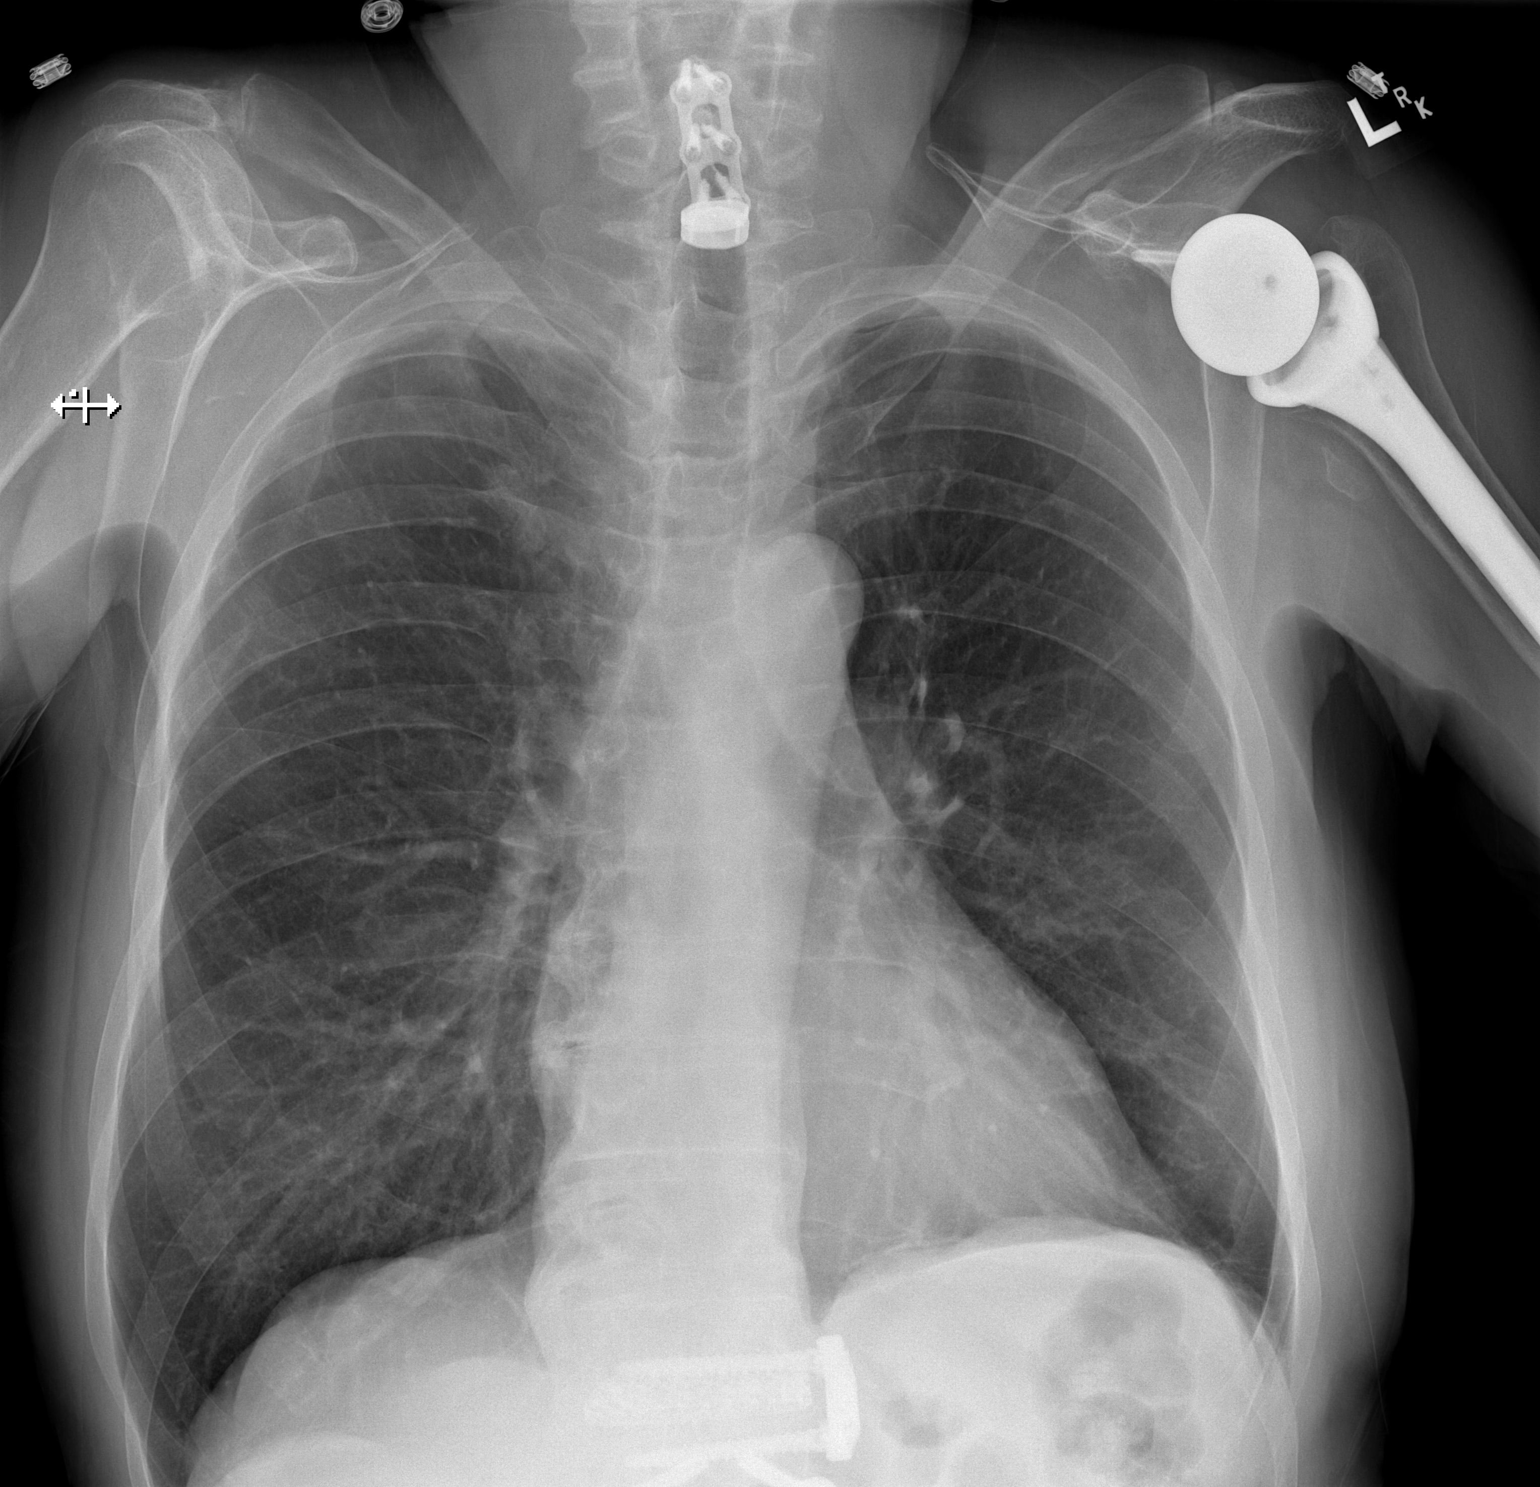

[w chest lat]
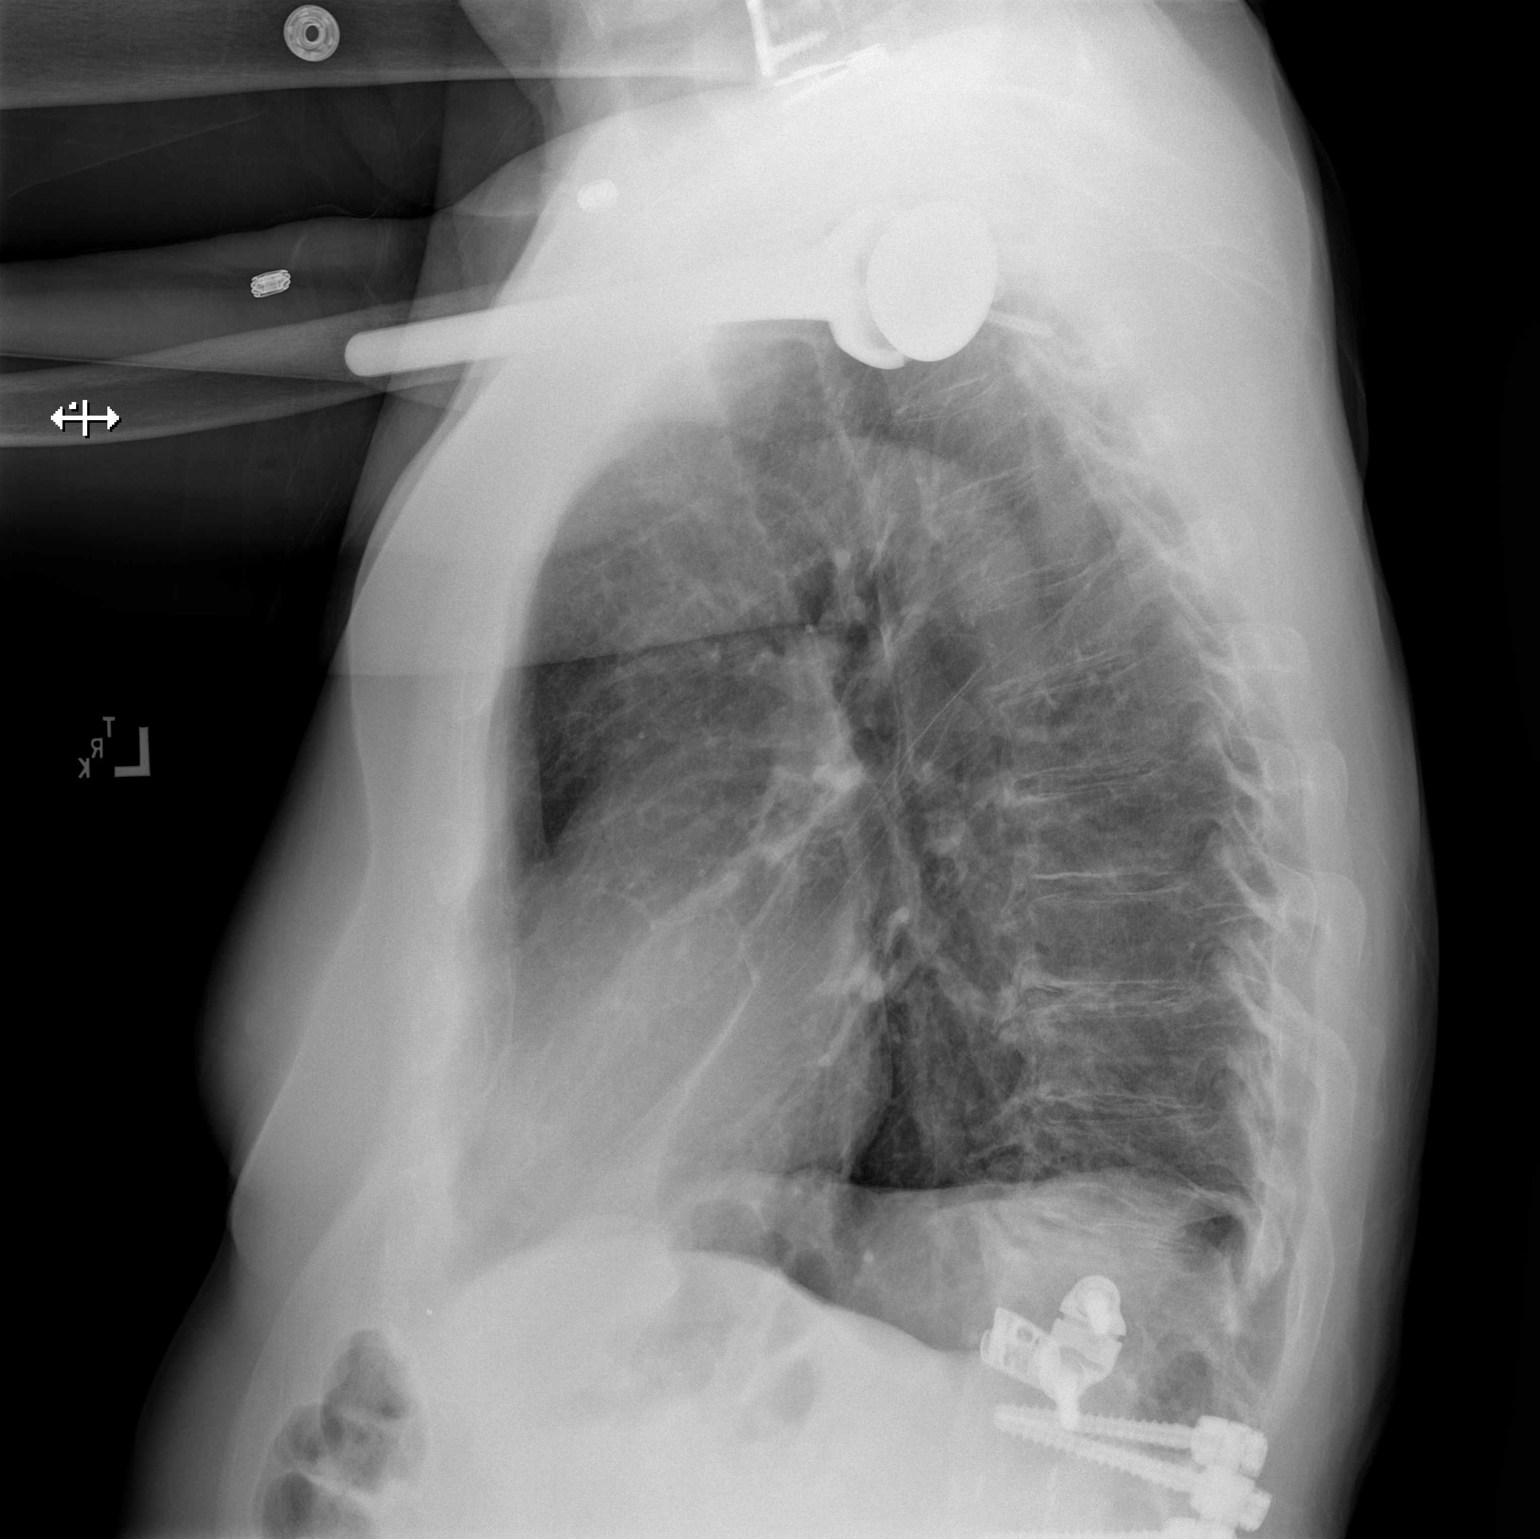

[2 of 2 positions shown; findings below may reference images not displayed]

FINDINGS: Cervical fusion hardware appears grossly. Left arthroplasty hardware
appears grossly. Partially visualized lower thoracolumbar fusion
hardware. The heart appears at the upper limit of normal. No pleural
effusion. No pneumothorax. No mass or consolidation. No acute
osseous abnormality.
IMPRESSION: No acute abnormality in the chest.

## 2023-01-28 ENCOUNTER — Other Ambulatory Visit: Payer: Self-pay | Admitting: Cardiology

## 2023-01-28 DIAGNOSIS — I480 Paroxysmal atrial fibrillation: Secondary | ICD-10-CM

## 2023-01-28 NOTE — Telephone Encounter (Signed)
Eliquis 5mg  refill request received. Patient is 77 years old, weight-88.4kg, Crea-0.72 on 11/12/22, Diagnosis-Afib, and last seen by Dr. Anne Fu on 10/25/22. Dose is appropriate based on dosing criteria. Will send in refill to requested pharmacy.

## 2023-02-10 DIAGNOSIS — M1460 Charcot's joint, unspecified site: Secondary | ICD-10-CM | POA: Diagnosis not present

## 2023-02-10 DIAGNOSIS — M79672 Pain in left foot: Secondary | ICD-10-CM | POA: Diagnosis not present

## 2023-02-10 DIAGNOSIS — G622 Polyneuropathy due to other toxic agents: Secondary | ICD-10-CM | POA: Diagnosis not present

## 2023-02-10 DIAGNOSIS — I739 Peripheral vascular disease, unspecified: Secondary | ICD-10-CM | POA: Diagnosis not present

## 2023-02-10 DIAGNOSIS — M79671 Pain in right foot: Secondary | ICD-10-CM | POA: Diagnosis not present

## 2023-02-10 DIAGNOSIS — G8929 Other chronic pain: Secondary | ICD-10-CM | POA: Diagnosis not present

## 2023-02-10 DIAGNOSIS — G63 Polyneuropathy in diseases classified elsewhere: Secondary | ICD-10-CM | POA: Diagnosis not present

## 2023-02-10 DIAGNOSIS — Z79891 Long term (current) use of opiate analgesic: Secondary | ICD-10-CM | POA: Diagnosis not present

## 2023-02-12 DIAGNOSIS — M19071 Primary osteoarthritis, right ankle and foot: Secondary | ICD-10-CM | POA: Diagnosis not present

## 2023-02-19 ENCOUNTER — Ambulatory Visit
Admission: EM | Admit: 2023-02-19 | Discharge: 2023-02-19 | Disposition: A | Discharge status: Home / Self Care | Payer: Medicare Other | Attending: Family Medicine | Admitting: Family Medicine

## 2023-02-19 ENCOUNTER — Encounter: Payer: Self-pay | Admitting: Emergency Medicine

## 2023-02-19 ENCOUNTER — Other Ambulatory Visit: Payer: Self-pay

## 2023-02-19 DIAGNOSIS — M79671 Pain in right foot: Secondary | ICD-10-CM

## 2023-02-19 DIAGNOSIS — R2241 Localized swelling, mass and lump, right lower limb: Secondary | ICD-10-CM | POA: Diagnosis not present

## 2023-02-19 MED ORDER — OXYCODONE-ACETAMINOPHEN 5-325 MG PO TABS: 1.0000 | ORAL_TABLET | Freq: Four times a day (QID) | ORAL | 0 refills | Status: AC | PRN

## 2023-02-19 NOTE — Discharge Instructions (Addendum)
Advised patient to take medication as directed with food to completion.  Advised patient of sedative effects of this medication.  Encouraged increase daily water intake to 64 ounces per day while taking this medication.  Advised patient to avoid hot tub for the next 10 to 14 days.  Advised if symptoms worsen and/or unresolved please follow-up with PCP or orthopedic provider for further evaluation.

## 2023-02-19 NOTE — ED Provider Notes (Signed)
[name]    CSN: [id number] Arrival date & time: 02/19/23  1204      History   Chief Complaint No chief complaint on file.   HPI Levi [name] is a 77 y.o. male.   HPI 77 year old male presents with swelling of right foot reports he has a rocker foot.  Patient has history of Charcot formation of right foot. PMH significant for CHF, asthma, and COPD.  Past Medical History:  Diagnosis Date   Agent orange exposure 1970's   takes Imdur,Diltiazem, and Atacand daily   ALLERGIC RHINITIS    Allergy    seasonal allergies   Asthma    uses inhaler and nebulizers   Bruises easily    d/t prednisone daily   Cataract    CHF (congestive heart failure) (HCC)    dx-on meds to treat   Chronic bronchitis (HCC)    "used to get it q yr; last time ?2008" (04/28/2012)   Clotting disorder (HCC) 2019   On blood thinner when I bleed it is for a consideral amount of time. This is tegardless of size of wound.   COPD (chronic obstructive pulmonary disease) (HCC)    agent orange exposure   Degenerative disk disease    "everywhere" (04/28/2012)   Diverticulitis    Dysrhythmia    afib   Elevated uric acid in blood    takes Allopurinol daily   Emphysema of lung (HCC)    Enlarged prostate    but not on any meds   GERD (gastroesophageal reflux disease)    takes Protonix daily   H/O hiatal hernia    Headache    History of pneumonia    a. 2010   Hyperlipidemia    takes Lipitor daily; pt. states he takes a preventive   Hypothyroidism    Joint pain    Joint swelling    Nausea and vomiting 11/12/2022   Neuromuscular disorder (HCC)    bilat legs, and bilat arms   Osteoporosis 2012   PAF (paroxysmal atrial fibrillation) (HCC)    a. Dx 04/2012, CHA2DS2VASc = 1 (age);  b. 04/2012 Echo: EF 40-50%, mild MR.   Personal history of colonic adenomas 02/09/2013   Pneumonia    Shortness of breath dyspnea    Thyroid disease    no taking meds-caused dizziness    Patient Active Problem  List   Diagnosis Date Noted   Chronic superficial venous thrombosis of both lower extremities 11/18/2022   Nausea and vomiting 11/12/2022   Venous stasis ulcer (HCC) 09/13/2022   Primary osteoarthritis of right shoulder, status post left shoulder arthroplasty 07/05/2022   Shingles 05/16/2022   Olecranon bursitis, right elbow 04/04/2022   Vertigo 04/04/2022   Orthostatic hypotension 03/21/2022   BPH (benign prostatic hyperplasia) 02/21/2022   Annual physical exam 01/22/2022   Charcot's joint of foot, right 01/22/2022   Lower extremity edema 08/08/2021   Heartburn    Atypical chest pain    Cervical radicular pain 06/11/2021   Chronic anticoagulation 03/15/2021   Peripheral neuropathy 02/22/2021   Chronic systolic heart failure (HCC) 02/22/2021   Essential (primary) hypertension 11/15/2020   Arthritis of carpometacarpal (CMC) joint of right thumb 08/07/2020   Cellulitis 03/22/2020   COPD (chronic obstructive pulmonary disease) (HCC) 03/21/2020   Chest wall pain 09/21/2019   GERD (gastroesophageal reflux disease) status post Nissen fundoplication 12/03/2017   Pneumothorax, left 04/11/2017   Lumbar stenosis with neurogenic claudication 04/07/2017   History of total right hip arthroplasty 07/31/2015  Primary osteoarthritis of right knee 06/17/2014   Thrush of mouth and esophagus (HCC) 04/03/2014   Palpitation 03/28/2014   Pseudoarthrosis of lumbar spine 06/15/2013   Hyperlipidemia    History of colonic polyps 02/09/2013   Atrial fibrillation (HCC) 05/11/2012   Osteoarthritis of left knee 01/10/2012   Seasonal and perennial allergic rhinitis 07/23/2010   Asthma, moderate persistent 03/06/2009    Past Surgical History:  Procedure Laterality Date   89 HOUR PH STUDY N/A 05/30/2021   Procedure: 24 HOUR PH STUDY;  Surgeon: Shellia Cleverly, DO;  Location: [facility];  Service: Gastroenterology;  Laterality: N/A;   ANTERIOR CERVICAL DECOMP/DISCECTOMY FUSION N/A 07/21/2012    Procedure: ANTERIOR CERVICAL DECOMPRESSION/DISCECTOMY FUSION 2 LEVELS;  Surgeon: Barnett Abu, MD;  Location: [facility];  Service: Neurosurgery;  Laterality: N/A;  C4-5 C5-6 Anterior cervical decompression/diskectomy/fusion   ANTERIOR LAT LUMBAR FUSION N/A 04/07/2017   Procedure: Thoracic twelve-Lumbar one Anterolateral decompression/fusion;  Surgeon: Barnett Abu, MD;  Location: [facility];  Service: Neurosurgery;  Laterality: N/A;   ARTHRODESIS FOOT WITH WEIL OSTEOTOMY Left 11/07/2022   Procedure: LEFT THIRD METATARSOPHALANGEAL CAPSULOTOMY, WEIL OSTEOTOMY;  Surgeon: Terance Hart, MD;  Location: [facility] OR;  Service: Orthopedics;  Laterality: Left;  LENGTH OF SURGERY: 60 MINUTES   BACK SURGERY     x 3   CARDIAC CATHETERIZATION  11/2008   Dr. Anne Fu - 20% calcified non flow limiting left main, 50% EF apical hypokinesis   CERVICAL DISC ARTHROPLASTY N/A 04/07/2017   Procedure: Cervical six-seven Disc arthroplasty;  Surgeon: Barnett Abu, MD;  Location: [facility] OR;  Service: Neurosurgery;  Laterality: N/A;   CHEST TUBE INSERTION Left 04/11/2017   Procedure: CHEST TUBE INSERTION;  Surgeon: Kerin Perna, MD;  Location: [facility] OR;  Service: Thoracic;  Laterality: Left;   CHOLECYSTECTOMY     COLONOSCOPY  2016   CG-MAC-miralax(good)servere TICS/TA x 2   ESOPHAGEAL MANOMETRY N/A 05/30/2021   Procedure: ESOPHAGEAL MANOMETRY (EM);  Surgeon: Shellia Cleverly, DO;  Location: [facility];  Service: Gastroenterology;  Laterality: N/A;  ph impedence   ESOPHAGOGASTRODUODENOSCOPY     EYE SURGERY Bilateral 2011   steroidal encapsulation" (04/28/2012)   FOOT SURGERY Right    x 2   JOINT REPLACEMENT     Many surgeries including 5 joit replacements   KNEE ARTHROSCOPY Bilateral    LATERAL / POSTERIOR COMBINED FUSION LUMBAR SPINE  2012   LEFT ATRIAL APPENDAGE OCCLUSION N/A 02/22/2021   Procedure: LEFT ATRIAL APPENDAGE OCCLUSION;  Surgeon: Lanier Prude, MD;  [facility];  Service:  Cardiovascular;  Laterality: N/A;   POLYPECTOMY  2016   severe TICS/TA x 2   POSTERIOR CERVICAL LAMINECTOMY WITH MET- RX Right 06/11/2021   Procedure: Right Cervical six-seven  Laminectomy and foraminotomy with metrex;  Surgeon: Barnett Abu, MD;  Location: [facility] OR;  Service: Neurosurgery;  Laterality: Right;   REVERSE SHOULDER ARTHROPLASTY  04/28/2012   Procedure: REVERSE SHOULDER ARTHROPLASTY;  Surgeon: Mable Paris, MD;  Location: [facility] OR;  Service: Orthopedics;  Laterality: Left;  Left shouder reverse total shoulder arthroplasty   SHOULDER SURGERY     SPINE SURGERY     I have had 7 spinal surgeries   TEE WITHOUT CARDIOVERSION N/A 02/22/2021   Procedure: TRANSESOPHAGEAL ECHOCARDIOGRAM (TEE);  Surgeon: Lanier Prude, MD;  Location: [facility] INV[facility] CV LAB;  Service: Cardiovascular;  Laterality: N/A;   TENDON REPAIR Right 08/07/2020   Procedure: RIGHT HAND LIGAMENT RECONSTRUCTION AND TENDON INTERPOSITION;  Surgeon: Jodi Geralds, MD;  Location: [facility];  Service: Orthopedics;  Laterality: Right;   TONSILLECTOMY AND ADENOIDECTOMY  1954   TOTAL HIP ARTHROPLASTY Left 2011   "left" (04/28/2012)   TOTAL HIP ARTHROPLASTY Right 07/31/2015   Procedure: TOTAL HIP ARTHROPLASTY ANTERIOR APPROACH;  Surgeon: Jodi Geralds, MD;  [facility];  Service: Orthopedics;  Laterality: Right;   TOTAL KNEE ARTHROPLASTY  01/10/2012   Procedure: TOTAL KNEE ARTHROPLASTY;  Surgeon: Harvie Junior, MD;  Location: [facility];  Service: Orthopedics;;  left total knee arthroplasty   TOTAL KNEE ARTHROPLASTY Right 06/17/2014   Procedure: TOTAL KNEE ARTHROPLASTY;  Surgeon: Harvie Junior, MD;  Location: [facility] OR;  Service: Orthopedics;  Laterality: Right;   toupet fundolplication  11/01/2021       Home Medications    Prior to Admission medications   Medication Sig Start Date End Date Taking? Authorizing Provider  oxyCODONE-acetaminophen (PERCOCET/ROXICET) 5-325 MG tablet Take 1 tablet by mouth every 6  (six) hours as needed for up to 7 days for severe pain. 02/19/23 02/26/23 Yes Trevor Iha, FNP  albuterol (VENTOLIN HFA) 108 (90 Base) MCG/ACT inhaler INHALE 2 PUFFS BY MOUTH EVERY 4 HOURS AS NEEDED FOR WHEEZE OR FOR SHORTNESS OF BREATH 01/14/23   Jetty Duhamel D, MD  allopurinol (ZYLOPRIM) 100 MG tablet Take 1 tablet (100 mg total) by mouth in the morning. 11/25/22   Monica Becton, MD  amoxicillin-clavulanate (AUGMENTIN) 875-125 MG tablet Take 1 tablet by mouth 2 (two) times daily. Patient not taking: Reported on 02/19/2023 11/25/22   Monica Becton, MD  atorvastatin (LIPITOR) 20 MG tablet TAKE 1 TABLET BY MOUTH EVERY DAY 09/17/22   Jake Bathe, MD  cyclobenzaprine (FLEXERIL) 10 MG tablet Take 1 tablet (10 mg total) by mouth at bedtime. Patient taking differently: Take 10 mg by mouth daily as needed for muscle spasms. 10/01/22   Monica Becton, MD  diclofenac Sodium (VOLTAREN) 1 % GEL Apply 2 g topically 3 (three) times daily as needed (pain.).    [provider]  diltiazem (CARDIZEM CD) 180 MG 24 hr capsule TAKE 1 CAPSULE BY MOUTH EVERY DAY 10/16/22   Jake Bathe, MD  diltiazem (CARDIZEM) 30 MG tablet Take 1 tablet (30 mg total) by mouth as needed (heart rate over 120). 10/25/22   Jake Bathe, MD  DULoxetine (CYMBALTA) 30 MG capsule Take 1 capsule (30 mg total) by mouth daily. 12/24/22   Lomax, Amy, NP  ELIQUIS 5 MG TABS tablet TAKE 1 TABLET BY MOUTH TWICE A DAY 01/28/23   Jake Bathe, MD  furosemide (LASIX) 40 MG tablet Take 1 tablet (40 mg total) by mouth 2 (two) times daily. As directed 09/13/22   Monica Becton, MD  ipratropium-albuterol (DUONEB) 0.5-2.5 (3) MG/3ML SOLN 1 NEBULE EVERY 6 HOURS AS NEEDED. **J45.3** 03/22/20   Jetty Duhamel D, MD  isosorbide mononitrate (IMDUR) 30 MG 24 hr tablet TAKE 1 TABLET BY MOUTH EVERY DAY 08/26/22   Jake Bathe, MD  Nebulizers (COMPRESSOR/NEBULIZER) MISC Use as directed 12/01/17   Waymon Budge, MD  Olodaterol  HCl (STRIVERDI RESPIMAT) 2.5 MCG/ACT AERS INHALE 2 PUFFS BY MOUTH INTO THE LUNGS DAILY 01/18/22   Waymon Budge, MD  ondansetron (ZOFRAN-ODT) 8 MG disintegrating tablet Take 1 tablet (8 mg total) by mouth every 8 (eight) hours as needed for nausea. 11/12/22   Monica Becton, MD  pantoprazole (PROTONIX) 40 MG tablet TAKE 1 TABLET (40 MG TOTAL) BY MOUTH TWICE A DAY BEFORE  MEALS 11/04/22   Monica Becton, MD  potassium chloride SA (KLOR-CON M20) 20 MEQ tablet Take 1 tablet (20 mEq total) by mouth daily. 09/05/21   Jake Bathe, MD  predniSONE (DELTASONE) 10 MG tablet Take 1 tablet (10 mg total) by mouth in the morning and at bedtime. 10/30/22   Waymon Budge, MD  pregabalin (LYRICA) 75 MG capsule Take 1 capsule (75 mg total) by mouth 3 (three) times daily. 05/16/22   Monica Becton, MD  prochlorperazine (COMPAZINE) 10 MG tablet Take 1 tablet (10 mg total) by mouth every 6 (six) hours as needed for nausea or vomiting. 11/12/22   Monica Becton, MD  tamsulosin (FLOMAX) 0.4 MG CAPS capsule Take 0.4 mg by mouth at bedtime.    [provider]    Family History Family History  Problem Relation Age of Onset   Diabetes Mother    Heart disease Father        Died 13, MI   Cancer Father    Hyperlipidemia Father    Stroke Father    Prostate cancer Brother    Colon polyps Brother    Colon polyps Brother    Asthma Brother    COPD Brother    Colon cancer Brother        Dx age 65   Cancer Brother    Colon cancer Other    Esophageal cancer Neg Hx    Rectal cancer Neg Hx    Stomach cancer Neg Hx    Pancreatic cancer Neg Hx    Kidney disease Neg Hx    Liver disease Neg Hx    Neuropathy Neg Hx     Social History Social History   Tobacco Use   Smoking status: Never   Smokeless tobacco: Never  Vaping Use   Vaping status: Never Used  Substance Use Topics   Alcohol use: No   Drug use: No     Allergies   Morphine and codeine and Levaquin  [levofloxacin]   Review of Systems Review of Systems  Musculoskeletal:        Right foot swelling x 2 days  All other systems reviewed and are negative.    Physical Exam Triage Vital Signs ED Triage Vitals  Encounter Vitals Group     BP 02/19/23 1220 (!) 154/92     Systolic BP Percentile --      Diastolic BP Percentile --      Pulse Rate 02/19/23 1220 95     Resp 02/19/23 1220 20     Temp 02/19/23 1220 97.7 F (36.5 C)     Temp Source 02/19/23 1220 Oral     SpO2 --      Weight --      Height --      Head Circumference --      Peak Flow --      Pain Score 02/19/23 1217 8     Pain Loc --      Pain Education --      Exclude from Growth Chart --    No data found.  Updated Vital Signs BP (!) 154/92 (BP Location: Right Arm)   Pulse 95   Temp 97.7 F (36.5 C) (Oral)   Resp 20   Physical Exam Vitals and nursing note reviewed.  Constitutional:      Appearance: Normal appearance. He is normal weight.  HENT:     Head: Normocephalic and atraumatic.     Mouth/Throat:  Mouth: Mucous membranes are moist.     Pharynx: Oropharynx is clear.  Eyes:     Extraocular Movements: Extraocular movements intact.     Conjunctiva/sclera: Conjunctivae normal.     Pupils: Pupils are equal, round, and reactive to light.  Cardiovascular:     Rate and Rhythm: Normal rate and regular rhythm.     Pulses: Normal pulses.     Heart sounds: Normal heart sounds.  Pulmonary:     Effort: Pulmonary effort is normal.     Breath sounds: Normal breath sounds. No wheezing or rhonchi.  Musculoskeletal:        General: Normal range of motion.     Cervical back: Normal range of motion and neck supple.     Comments: Right foot: Erythematous, edematous please see images below  Skin:    General: Skin is warm and dry.  Neurological:     General: No focal deficit present.     Mental Status: He is alert and oriented to person, place, and time. Mental status is at baseline.  Psychiatric:        Mood  and Affect: Mood normal.        Behavior: Behavior normal.        Thought Content: Thought content normal.         UC Treatments / Results  Labs (all labs ordered are listed, but only abnormal results are displayed) Labs Reviewed - No data to display  EKG   Radiology No results found.  Procedures Procedures (including critical care time)  Medications Ordered in UC Medications - No data to display  Initial Impression / Assessment and Plan / UC Course  I have reviewed the triage vital signs and the nursing notes.  Pertinent labs & imaging results that were available during my care of the patient were reviewed by me and considered in my medical decision making (see chart for details).     MDM: 1.  Acute foot pain, right-Rx'd Oxycodone/Acetaminophen 5/325 mg: Take 1 tablet every 6 hours for acute pain of right foot.  2.  Localized swelling of right foot-patient with history of Charcot foot and has recently been sitting in a hot tub per patient. Advised patient to take medication as directed with food to completion.  Advised patient of sedative effects of this medication.  Encouraged increase daily water intake to 64 ounces per day while taking this medication.  Advised patient to avoid hot tub for the next 10 to 14 days.  Advised if symptoms worsen and/or unresolved please follow-up with PCP or orthopedic provider for further evaluation.  Patient discharged home, hemodynamically stable.  Final Clinical Impressions(s) / UC Diagnoses   Final diagnoses:  Localized swelling of right foot  Acute foot pain, right     Discharge Instructions      Advised patient to take medication as directed with food to completion.  Advised patient of sedative effects of this medication.  Encouraged increase daily water intake to 64 ounces per day while taking this medication.  Advised patient to avoid hot tub for the next 10 to 14 days.  Advised if symptoms worsen and/or unresolved please  follow-up with PCP or orthopedic provider for further evaluation.     ED Prescriptions     Medication Sig Dispense Auth. Provider   oxyCODONE-acetaminophen (PERCOCET/ROXICET) 5-325 MG tablet Take 1 tablet by mouth every 6 (six) hours as needed for up to 7 days for severe pain. 28 tablet Trevor Iha, FNP  I have reviewed the PDMP during this encounter.   Trevor Iha, FNP 02/19/23 1353

## 2023-02-19 NOTE — ED Triage Notes (Addendum)
Right foot pain.  Patient says he has used a hot tub in his home and this morning has swelling, chronic issue.  Reports  this is "rocker foot"  Patient describes chronic issues involving bones easy to break.  Bottom of foot with callused area.  Patient has swelling to foot and lower leg

## 2023-02-27 DIAGNOSIS — M79604 Pain in right leg: Secondary | ICD-10-CM | POA: Diagnosis not present

## 2023-02-27 DIAGNOSIS — Z6826 Body mass index (BMI) 26.0-26.9, adult: Secondary | ICD-10-CM | POA: Diagnosis not present

## 2023-03-07 DIAGNOSIS — Z125 Encounter for screening for malignant neoplasm of prostate: Secondary | ICD-10-CM | POA: Diagnosis not present

## 2023-03-08 ENCOUNTER — Other Ambulatory Visit: Payer: Self-pay | Admitting: Cardiology

## 2023-03-09 ENCOUNTER — Other Ambulatory Visit: Payer: Self-pay | Admitting: Internal Medicine

## 2023-03-12 ENCOUNTER — Ambulatory Visit (INDEPENDENT_AMBULATORY_CARE_PROVIDER_SITE_OTHER): Payer: Medicare Other | Admitting: Sports Medicine

## 2023-03-12 ENCOUNTER — Encounter: Payer: Self-pay | Admitting: Sports Medicine

## 2023-03-12 VITALS — BP 140/80 | HR 83 | Ht 74.0 in | Wt 199.0 lb

## 2023-03-12 DIAGNOSIS — L97202 Non-pressure chronic ulcer of unspecified calf with fat layer exposed: Secondary | ICD-10-CM | POA: Diagnosis not present

## 2023-03-12 DIAGNOSIS — Z0000 Encounter for general adult medical examination without abnormal findings: Secondary | ICD-10-CM | POA: Diagnosis not present

## 2023-03-12 DIAGNOSIS — M14671 Charcot's joint, right ankle and foot: Secondary | ICD-10-CM | POA: Diagnosis not present

## 2023-03-12 DIAGNOSIS — M48062 Spinal stenosis, lumbar region with neurogenic claudication: Secondary | ICD-10-CM

## 2023-03-12 DIAGNOSIS — I83002 Varicose veins of unspecified lower extremity with ulcer of calf: Secondary | ICD-10-CM | POA: Diagnosis not present

## 2023-03-12 MED ORDER — CYCLOBENZAPRINE HCL 10 MG PO TABS
10.0000 mg | ORAL_TABLET | Freq: Every day | ORAL | 1 refills | Status: DC
Start: 2023-03-12 — End: 2023-05-27

## 2023-03-12 NOTE — Assessment & Plan Note (Signed)
We have tried to get him Shingrix and Tdap, and it looks like he has not gotten those yet. Return to see me in January for fasting annual physical and we will work on getting him caught up again.

## 2023-03-12 NOTE — Progress Notes (Signed)
    Procedures performed today:    None.  Independent interpretation of notes and tests performed by another provider:   None.  Brief History, Exam, Impression, and Recommendations:    Charcot's joint of foot, right Known peripheral neuropathy on nerve conduction study, he did see Dr. Susa Simmonds, no good surgical answer to his problem, Lyrica 75 mg 2-3 times a day provides marginal benefit. I have recommended a custom low-profile carbon fiber AFO. He has worked with neurology and he is in talks with [place] [facility] regarding spinal cord stimulator and pain management. I did advise him I have nothing else to offer him at this point.  Venous stasis ulcer (HCC) Right lower extremity venous stasis ulcer, appears to be healing, I did advise keeping it covered for protection, he is again not wearing his compression stockings, I have again emphasized the importance of this simple modality.  Annual physical exam We have tried to get him Shingrix and Tdap, and it looks like he has not gotten those yet. Return to see me in January for fasting annual physical and we will work on getting him caught up again.    ____________________________________________ [name]. [name], M.D., ABFM., CAQSM., AME. Primary Care and Sports Medicine [facility]  Adjunct Professor of Family Medicine  [employer]  Restaurant manager, fast food

## 2023-03-12 NOTE — Assessment & Plan Note (Signed)
Right lower extremity venous stasis ulcer, appears to be healing, I did advise keeping it covered for protection, he is again not wearing his compression stockings, I have again emphasized the importance of this simple modality.

## 2023-03-12 NOTE — Assessment & Plan Note (Signed)
Known peripheral neuropathy on nerve conduction study, he did see Dr. Susa Simmonds, no good surgical answer to his problem, Lyrica 75 mg 2-3 times a day provides marginal benefit. I have recommended a custom low-profile carbon fiber AFO. He has worked with neurology and he is in talks with [facility] regarding spinal cord stimulator and pain management. I did advise him I have nothing else to offer him at this point.

## 2023-03-13 DIAGNOSIS — R350 Frequency of micturition: Secondary | ICD-10-CM | POA: Diagnosis not present

## 2023-03-13 DIAGNOSIS — Z125 Encounter for screening for malignant neoplasm of prostate: Secondary | ICD-10-CM | POA: Diagnosis not present

## 2023-03-13 DIAGNOSIS — N401 Enlarged prostate with lower urinary tract symptoms: Secondary | ICD-10-CM | POA: Diagnosis not present

## 2023-03-14 ENCOUNTER — Ambulatory Visit
Admission: EM | Admit: 2023-03-14 | Discharge: 2023-03-14 | Disposition: A | Discharge status: Home / Self Care | Payer: Medicare Other | Attending: Family Medicine | Admitting: Family Medicine

## 2023-03-14 ENCOUNTER — Other Ambulatory Visit: Payer: Self-pay

## 2023-03-14 ENCOUNTER — Ambulatory Visit: Payer: Medicare Other

## 2023-03-14 DIAGNOSIS — M533 Sacrococcygeal disorders, not elsewhere classified: Secondary | ICD-10-CM | POA: Diagnosis not present

## 2023-03-14 DIAGNOSIS — W19XXXA Unspecified fall, initial encounter: Secondary | ICD-10-CM | POA: Diagnosis not present

## 2023-03-14 DIAGNOSIS — M5407 Panniculitis affecting regions of neck and back, lumbosacral region: Secondary | ICD-10-CM | POA: Diagnosis not present

## 2023-03-14 DIAGNOSIS — M79671 Pain in right foot: Secondary | ICD-10-CM | POA: Diagnosis not present

## 2023-03-14 DIAGNOSIS — M2141 Flat foot [pes planus] (acquired), right foot: Secondary | ICD-10-CM | POA: Diagnosis not present

## 2023-03-14 DIAGNOSIS — M4316 Spondylolisthesis, lumbar region: Secondary | ICD-10-CM | POA: Diagnosis not present

## 2023-03-14 DIAGNOSIS — M47816 Spondylosis without myelopathy or radiculopathy, lumbar region: Secondary | ICD-10-CM | POA: Diagnosis not present

## 2023-03-14 DIAGNOSIS — Z96643 Presence of artificial hip joint, bilateral: Secondary | ICD-10-CM | POA: Diagnosis not present

## 2023-03-14 DIAGNOSIS — M5418 Radiculopathy, sacral and sacrococcygeal region: Secondary | ICD-10-CM

## 2023-03-14 DIAGNOSIS — M19071 Primary osteoarthritis, right ankle and foot: Secondary | ICD-10-CM | POA: Diagnosis not present

## 2023-03-14 DIAGNOSIS — M545 Low back pain, unspecified: Secondary | ICD-10-CM | POA: Diagnosis not present

## 2023-03-14 DIAGNOSIS — S300XXA Contusion of lower back and pelvis, initial encounter: Secondary | ICD-10-CM | POA: Diagnosis not present

## 2023-03-14 DIAGNOSIS — M4310 Spondylolisthesis, site unspecified: Secondary | ICD-10-CM | POA: Diagnosis not present

## 2023-03-14 DIAGNOSIS — Z981 Arthrodesis status: Secondary | ICD-10-CM | POA: Diagnosis not present

## 2023-03-14 MED ORDER — OXYCODONE-ACETAMINOPHEN 5-325 MG PO TABS
1.0000 | ORAL_TABLET | Freq: Four times a day (QID) | ORAL | 0 refills | Status: DC | PRN
Start: 2023-03-14 — End: 2023-03-18

## 2023-03-14 NOTE — Discharge Instructions (Addendum)
Advised patient to take medication as directed with food.  Advised patient of sedative effects of this pain medication.  Encouraged increase daily water intake to 64 ounces per day while taking this medication.  Advised we will follow-up with lumbar, sacrum/coccyx and right foot x-rays once resolved.  Advised if symptoms worsen and/or unresolved please follow-up with PCP or here for further evaluation.

## 2023-03-14 NOTE — ED Triage Notes (Signed)
Fell Wednesday night over one of his dogs, landed on bottom and hit right foot. Has bruising to tailbone area, and believes he has a fracture.

## 2023-03-14 NOTE — ED Provider Notes (Signed)
[facility]i [name] C[facility]    CSN: [id number] Arrival date & time: 03/14/23  1023      History   Chief Complaint Chief Complaint  Patient presents with   Fall    HPI Levi [name] is a 77 y.o. male.   HPI very pleasant 77 year old male presents with sacrum/coccygeal pain and right foot pain secondary to fall at his home on Wednesday night.  Patient reports tripping over one of his dogs and landing on his bottom.  Patient has bruising about tailbone area and believes that he may have a fracture.     PMH significant for agent orange exposure, CHF, clotting disorder, and COPD.  Patient is currently on apixaban and denies any unusual bleeding.  Past Medical History:  Diagnosis Date   Agent orange exposure 1970's   takes Imdur,Diltiazem, and Atacand daily   ALLERGIC RHINITIS    Allergy    seasonal allergies   Asthma    uses inhaler and nebulizers   Bruises easily    d/t prednisone daily   Cataract    CHF (congestive heart failure) (HCC)    dx-on meds to treat   Chronic bronchitis (HCC)    "used to get it q yr; last time ?2008" (04/28/2012)   Clotting disorder (HCC) 2019   On blood thinner when I bleed it is for a consideral amount of time. This is tegardless of size of wound.   COPD (chronic obstructive pulmonary disease) (HCC)    agent orange exposure   Degenerative disk disease    "everywhere" (04/28/2012)   Diverticulitis    Dysrhythmia    afib   Elevated uric acid in blood    takes Allopurinol daily   Emphysema of lung (HCC)    Enlarged prostate    but not on any meds   GERD (gastroesophageal reflux disease)    takes Protonix daily   H/O hiatal hernia    Headache    History of pneumonia    a. 2010   Hyperlipidemia    takes Lipitor daily; pt. states he takes a preventive   Hypothyroidism    Joint pain    Joint swelling    Nausea and vomiting 11/12/2022   Neuromuscular disorder (HCC)    bilat legs, and bilat arms   Osteoporosis 2012   PAF (paroxysmal  atrial fibrillation) (HCC)    a. Dx 04/2012, CHA2DS2VASc = 1 (age);  b. 04/2012 Echo: EF 40-50%, mild MR.   Personal history of colonic adenomas 02/09/2013   Pneumonia    Shortness of breath dyspnea    Thyroid disease    no taking meds-caused dizziness    Patient Active Problem List   Diagnosis Date Noted   Chronic superficial venous thrombosis of both lower extremities 11/18/2022   Nausea and vomiting 11/12/2022   Venous stasis ulcer (HCC) 09/13/2022   Primary osteoarthritis of right shoulder, status post left shoulder arthroplasty 07/05/2022   Shingles 05/16/2022   Olecranon bursitis, right elbow 04/04/2022   Vertigo 04/04/2022   Orthostatic hypotension 03/21/2022   BPH (benign prostatic hyperplasia) 02/21/2022   Annual physical exam 01/22/2022   Charcot's joint of foot, right 01/22/2022   Lower extremity edema 08/08/2021   Heartburn    Atypical chest pain    Cervical radicular pain 06/11/2021   Chronic anticoagulation 03/15/2021   Peripheral neuropathy 02/22/2021   Chronic systolic heart failure (HCC) 02/22/2021   Essential (primary) hypertension 11/15/2020   Arthritis of carpometacarpal (CMC) joint of right thumb 08/07/2020   Cellulitis  03/22/2020   COPD (chronic obstructive pulmonary disease) (HCC) 03/21/2020   Chest wall pain 09/21/2019   GERD (gastroesophageal reflux disease) status post Nissen fundoplication 12/03/2017   Pneumothorax, left 04/11/2017   Lumbar stenosis with neurogenic claudication 04/07/2017   History of total right hip arthroplasty 07/31/2015   Primary osteoarthritis of right knee 06/17/2014   Thrush of mouth and esophagus (HCC) 04/03/2014   Palpitation 03/28/2014   Pseudoarthrosis of lumbar spine 06/15/2013   Hyperlipidemia    History of colonic polyps 02/09/2013   Atrial fibrillation (HCC) 05/11/2012   Osteoarthritis of left knee 01/10/2012   Seasonal and perennial allergic rhinitis 07/23/2010   Asthma, moderate persistent 03/06/2009     Past Surgical History:  Procedure Laterality Date   30 HOUR PH STUDY N/A 05/30/2021   Procedure: 24 HOUR PH STUDY;  Surgeon: Shellia Cleverly, DO;  Location: [facility];  Service: Gastroenterology;  Laterality: N/A;   ANTERIOR CERVICAL DECOMP/DISCECTOMY FUSION N/A 07/21/2012   Procedure: ANTERIOR CERVICAL DECOMPRESSION/DISCECTOMY FUSION 2 LEVELS;  Surgeon: Barnett Abu, MD;  Location: [facility];  Service: Neurosurgery;  Laterality: N/A;  C4-5 C5-6 Anterior cervical decompression/diskectomy/fusion   ANTERIOR LAT LUMBAR FUSION N/A 04/07/2017   Procedure: Thoracic twelve-Lumbar one Anterolateral decompression/fusion;  Surgeon: Barnett Abu, MD;  Location: [facility] OR;  Service: Neurosurgery;  Laterality: N/A;   ARTHRODESIS FOOT WITH WEIL OSTEOTOMY Left 11/07/2022   Procedure: LEFT THIRD METATARSOPHALANGEAL CAPSULOTOMY, WEIL OSTEOTOMY;  Surgeon: Terance Hart, MD;  Location: [facility] OR;  Service: Orthopedics;  Laterality: Left;  LENGTH OF SURGERY: 60 MINUTES   BACK SURGERY     x 3   CARDIAC CATHETERIZATION  11/2008   Dr. Anne Fu - 20% calcified non flow limiting left main, 50% EF apical hypokinesis   CERVICAL DISC ARTHROPLASTY N/A 04/07/2017   Procedure: Cervical six-seven Disc arthroplasty;  Surgeon: Barnett Abu, MD;  Location: [facility] OR;  Service: Neurosurgery;  Laterality: N/A;   CHEST TUBE INSERTION Left 04/11/2017   Procedure: CHEST TUBE INSERTION;  Surgeon: Kerin Perna, MD;  Location: [facility] OR;  Service: Thoracic;  Laterality: Left;   CHOLECYSTECTOMY     COLONOSCOPY  2016   CG-MAC-miralax(good)servere TICS/TA x 2   ESOPHAGEAL MANOMETRY N/A 05/30/2021   Procedure: ESOPHAGEAL MANOMETRY (EM);  Surgeon: Shellia Cleverly, DO;  Location: [facility];  Service: Gastroenterology;  Laterality: N/A;  ph impedence   ESOPHAGOGASTRODUODENOSCOPY     EYE SURGERY Bilateral 2011   steroidal encapsulation" (04/28/2012)   FOOT SURGERY Right    x 2   JOINT REPLACEMENT     Many surgeries  including 5 joit replacements   KNEE ARTHROSCOPY Bilateral    LATERAL / POSTERIOR COMBINED FUSION LUMBAR SPINE  2012   LEFT ATRIAL APPENDAGE OCCLUSION N/A 02/22/2021   Procedure: LEFT ATRIAL APPENDAGE OCCLUSION;  Surgeon: Lanier Prude, MD;  [facility];  Service: Cardiovascular;  Laterality: N/A;   POLYPECTOMY  2016   severe TICS/TA x 2   POSTERIOR CERVICAL LAMINECTOMY WITH MET- RX Right 06/11/2021   Procedure: Right Cervical six-seven  Laminectomy and foraminotomy with metrex;  Surgeon: Barnett Abu, MD;  Location: [facility] OR;  Service: Neurosurgery;  Laterality: Right;   REVERSE SHOULDER ARTHROPLASTY  04/28/2012   Procedure: REVERSE SHOULDER ARTHROPLASTY;  Surgeon: Mable Paris, MD;  Location: [facility] OR;  Service: Orthopedics;  Laterality: Left;  Left shouder reverse total shoulder arthroplasty   SHOULDER SURGERY     SPINE SURGERY     I have had 7 spinal surgeries  TEE WITHOUT CARDIOVERSION N/A 02/22/2021   Procedure: TRANSESOPHAGEAL ECHOCARDIOGRAM (TEE);  Surgeon: Lanier Prude, MD;  Location: [facility] INVASIVE CV LAB;  Service: Cardiovascular;  Laterality: N/A;   TENDON REPAIR Right 08/07/2020   Procedure: RIGHT HAND LIGAMENT RECONSTRUCTION AND TENDON INTERPOSITION;  Surgeon: Jodi Geralds, MD;  Location: [facility];  Service: Orthopedics;  Laterality: Right;   TONSILLECTOMY AND ADENOIDECTOMY  1954   TOTAL HIP ARTHROPLASTY Left 2011   "left" (04/28/2012)   TOTAL HIP ARTHROPLASTY Right 07/31/2015   Procedure: TOTAL HIP ARTHROPLASTY ANTERIOR APPROACH;  Surgeon: Jodi Geralds, MD;  [facility];  Service: Orthopedics;  Laterality: Right;   TOTAL KNEE ARTHROPLASTY  01/10/2012   Procedure: TOTAL KNEE ARTHROPLASTY;  Surgeon: Harvie Junior, MD;  Location: [facility] OR;  Service: Orthopedics;;  left total knee arthroplasty   TOTAL KNEE ARTHROPLASTY Right 06/17/2014   Procedure: TOTAL KNEE ARTHROPLASTY;  Surgeon: Harvie Junior, MD;  Location: [facility] OR;  Service:  Orthopedics;  Laterality: Right;   toupet fundolplication  11/01/2021       Home Medications    Prior to Admission medications   Medication Sig Start Date End Date Taking? Authorizing Provider  oxyCODONE-acetaminophen (PERCOCET/ROXICET) 5-325 MG tablet Take 1 tablet by mouth every 6 (six) hours as needed for up to 7 days for severe pain (pain score 7-10). 03/14/23 03/21/23 Yes Trevor Iha, FNP  albuterol (VENTOLIN HFA) 108 (90 Base) MCG/ACT inhaler INHALE 2 PUFFS BY MOUTH EVERY 4 HOURS AS NEEDED FOR WHEEZE OR FOR SHORTNESS OF BREATH 01/14/23   Jetty Duhamel D, MD  allopurinol (ZYLOPRIM) 100 MG tablet Take 1 tablet (100 mg total) by mouth in the morning. 11/25/22   Monica Becton, MD  atorvastatin (LIPITOR) 20 MG tablet TAKE 1 TABLET BY MOUTH EVERY DAY 09/17/22   Jake Bathe, MD  cyclobenzaprine (FLEXERIL) 10 MG tablet Take 1 tablet (10 mg total) by mouth at bedtime. 03/12/23   Monica Becton, MD  diclofenac Sodium (VOLTAREN) 1 % GEL Apply 2 g topically 3 (three) times daily as needed (pain.).    [provider]  diltiazem (CARDIZEM CD) 180 MG 24 hr capsule TAKE 1 CAPSULE BY MOUTH EVERY DAY 10/16/22   Jake Bathe, MD  diltiazem (CARDIZEM) 30 MG tablet TAKE 1 TABLET (30 MG TOTAL) BY MOUTH AS NEEDED (HEART RATE OVER 120). 03/10/23   Jake Bathe, MD  DULoxetine (CYMBALTA) 30 MG capsule Take 1 capsule (30 mg total) by mouth daily. 12/24/22   Lomax, Amy, NP  ELIQUIS 5 MG TABS tablet TAKE 1 TABLET BY MOUTH TWICE A DAY 01/28/23   Jake Bathe, MD  ipratropium-albuterol (DUONEB) 0.5-2.5 (3) MG/3ML SOLN 1 NEBULE EVERY 6 HOURS AS NEEDED. **J45.3** 03/22/20   Jetty Duhamel D, MD  isosorbide mononitrate (IMDUR) 30 MG 24 hr tablet TAKE 1 TABLET BY MOUTH EVERY DAY 03/10/23   Jake Bathe, MD  Nebulizers (COMPRESSOR/NEBULIZER) MISC Use as directed 12/01/17   Waymon Budge, MD  Olodaterol HCl (STRIVERDI RESPIMAT) 2.5 MCG/ACT AERS INHALE 2 PUFFS BY MOUTH INTO THE LUNGS DAILY  03/10/23   Jetty Duhamel D, MD  pantoprazole (PROTONIX) 40 MG tablet TAKE 1 TABLET (40 MG TOTAL) BY MOUTH TWICE A DAY BEFORE MEALS 11/04/22   Monica Becton, MD  potassium chloride SA (KLOR-CON M20) 20 MEQ tablet Take 1 tablet (20 mEq total) by mouth daily. 09/05/21   Jake Bathe, MD  predniSONE (DELTASONE) 10 MG tablet Take 1 tablet (10 mg total)  by mouth in the morning and at bedtime. 10/30/22   Waymon Budge, MD  pregabalin (LYRICA) 75 MG capsule Take 1 capsule (75 mg total) by mouth 3 (three) times daily. 05/16/22   Monica Becton, MD  tamsulosin (FLOMAX) 0.4 MG CAPS capsule Take 0.4 mg by mouth at bedtime.    [provider]    Family History Family History  Problem Relation Age of Onset   Diabetes Mother    Heart disease Father        Died 38, [place]   Cancer Father    Hyperlipidemia Father    Stroke Father    Prostate cancer Brother    Colon polyps Brother    Colon polyps Brother    Asthma Brother    COPD Brother    Colon cancer Brother        Dx age 39   Cancer Brother    Colon cancer Other    Esophageal cancer Neg Hx    Rectal cancer Neg Hx    Stomach cancer Neg Hx    Pancreatic cancer Neg Hx    Kidney disease Neg Hx    Liver disease Neg Hx    Neuropathy Neg Hx     Social History Social History   Tobacco Use   Smoking status: Never   Smokeless tobacco: Never  Vaping Use   Vaping status: Never Used  Substance Use Topics   Alcohol use: No   Drug use: No     Allergies   Levaquin [levofloxacin]   Review of Systems Review of Systems  Musculoskeletal:        Right foot pain and lower back/tailbone pain secondary to fall that occurred 2 nights ago.  All other systems reviewed and are negative.    Physical Exam Triage Vital Signs ED Triage Vitals  Encounter Vitals Group     BP 03/14/23 1030 126/75     Systolic BP Percentile --      Diastolic BP Percentile --      Pulse Rate 03/14/23 1030 76     Resp 03/14/23 1030 16      Temp 03/14/23 1030 97.6 F (36.4 C)     Temp src --      SpO2 03/14/23 1030 99 %     Weight --      Height --      Head Circumference --      Peak Flow --      Pain Score 03/14/23 1033 9     Pain Loc --      Pain Education --      Exclude from Growth Chart --    No data found.  Updated Vital Signs BP 126/75   Pulse 76   Temp 97.6 F (36.4 C)   Resp 16   SpO2 99%      Physical Exam Vitals and nursing note reviewed.  Constitutional:      Appearance: Normal appearance. He is normal weight.  HENT:     Head: Normocephalic and atraumatic.     Mouth/Throat:     Mouth: Mucous membranes are moist.     Pharynx: Oropharynx is clear.  Eyes:     Extraocular Movements: Extraocular movements intact.     Conjunctiva/sclera: Conjunctivae normal.     Pupils: Pupils are equal, round, and reactive to light.  Cardiovascular:     Rate and Rhythm: Normal rate and regular rhythm.     Pulses: Normal pulses.     Heart  sounds: Normal heart sounds.  Pulmonary:     Effort: Pulmonary effort is normal.     Breath sounds: Normal breath sounds. No wheezing, rhonchi or rales.  Musculoskeletal:        General: Normal range of motion.     Cervical back: Normal range of motion and neck supple.     Comments: Lumbar sacral spine (inferior superior aspect): Mild soft tissue swelling noted with surrounding ecchymosis, no deformity noted  Right foot dorsum (anterior lateral aspect): TTP lateral distal aspect over fourth/fifth metatarsals charcot arthropathy noted  Skin:    General: Skin is warm and dry.  Neurological:     General: No focal deficit present.     Mental Status: He is alert and oriented to person, place, and time. Mental status is at baseline.     Gait: Gait abnormal.     Comments: Antalgic gait favoring right foot, ambulating with multipoint single cane today  Psychiatric:        Mood and Affect: Mood normal.        Behavior: Behavior normal.        Thought Content: Thought content  normal.      UC Treatments / Results  Labs (all labs ordered are listed, but only abnormal results are displayed) Labs Reviewed - No data to display  EKG   Radiology DG Foot Complete Right  Result Date: 03/14/2023 CLINICAL DATA:  Fall 2 days ago.  Hit right foot.  Pain. EXAM: RIGHT FOOT COMPLETE - 3+ VIEW COMPARISON:  Right foot radiographs 01/22/2022 FINDINGS: Severe first tarsometatarsal joint space narrowing, subchondral sclerosis, and peripheral osteophytosis. Additional severe second through fifth tarsometatarsal joint space narrowing with subchondral sclerosis within the second through fourth rays. Postsurgical changes of resection of the distal aspect of the second through fourth proximal phalanges again noted. Mild narrowing of the AP dimension of the mid to anterior aspect of the navicular on lateral view. Moderate to high-grade pes planus. Mild-to-moderate plantar calcaneal heel spur. No acute fracture is seen. IMPRESSION: 1. No acute fracture. 2. Severe first through fifth tarsometatarsal osteoarthritis with moderate to high-grade pes planus again consistent with Charcot arthropathy. Electronically Signed   By: Neita Garnet M.D.   On: 03/14/2023 14:10   DG Sacrum/Coccyx  Result Date: 03/14/2023 CLINICAL DATA:  Sacrum/coccyx pain with bruising secondary to fall 2 days ago. EXAM: SACRUM AND COCCYX - 2+ VIEW COMPARISON:  KUB 11/12/2022, AP pelvis 03/07/2020, lumbar spine radiographs 09/13/2022 FINDINGS: Partial visualization of lumbosacral bilateral trans foraminal screw fusion hardware as described on contemporaneous lumbar spine report. Unchanged fracture of the right S1 screw. Grade 1 anterolisthesis of L4 on L5. Bowel gas and stool obscure portions of the mid to lower sacrum on frontal view. Normal alignment of the sacrum and coccyx on lateral view. No acute fracture is seen. Mild bilateral sacroiliac subchondral sclerosis and degenerative vacuum phenomenon. Partial visualization  of bilateral total hip arthroplasty hardware. IMPRESSION: 1. No acute fracture is seen. 2. Mild bilateral sacroiliac osteoarthritis. Electronically Signed   By: Neita Garnet M.D.   On: 03/14/2023 14:04   DG Lumbar Spine Complete  Result Date: 03/14/2023 CLINICAL DATA:  Lower back pain secondary to fall 2 days ago. Bruising in tailbone area. EXAM: LUMBAR SPINE - COMPLETE 4+ VIEW COMPARISON:  Lumbar spine radiographs 09/13/2022, CT lumbar spine 05/12/2019 FINDINGS: Postsurgical changes are again seen of bilateral transpedicular rod and screw fusion hardware from L1 through S1 except no screws at L5, unchanged from prior. There are L1-2  through L5-S1 intervertebral disc spacers. There is again a fracture of the right S1 screw, unchanged. There is again left lateral T12-L1 fusion with T12-L1 interbody spacer. 3 mm grade 1 anterolisthesis of L4 on L5 is unchanged. Within the limitations of diffuse decreased bone mineralization, no acute fracture is seen. Mild-to-moderate anterior superior T11-12 body height loss is unchanged. Mild-to-moderate atherosclerotic calcifications. Cholecystectomy clips. Partial visualization of bilateral total hip arthroplasty. IMPRESSION: 1. No acute fracture is seen. 2. Postsurgical changes are again seen of bilateral transpedicular rod and screw fusion hardware from L1 through S1 except no screws at L5, unchanged from prior. There is again a fracture of the right S1 screw, unchanged. No significant change. Electronically Signed   By: Neita Garnet M.D.   On: 03/14/2023 14:01    Procedures Procedures (including critical care time)  Medications Ordered in UC Medications - No data to display  Initial Impression / Assessment and Plan / UC Course  I have reviewed the triage vital signs and the nursing notes.  Pertinent labs & imaging results that were available during my care of the patient were reviewed by me and considered in my medical decision making (see chart for  details).     MDM: 1.  Pain in the coccyx-sacrum/coccyx x-ray results revealed above; 2.  Fall, initial encounter-Lumbar spine x-ray results revealed above, Rx'd Percocet/Roxicet 5/325 mg tablet: Take 1 tablet by mouth every 6 hours for severe lower back pain patient advised of sedative effects of this medication.  3.  Right foot pain-right foot x-ray results revealed above Rx'd Percocet/Roxicet 5/325 mg tablet: Take 1 tablet by mouth every 6 hours for severe lower back pain. Advised patient to take medication as directed with food.  Advised patient of sedative effects of this pain medication.  Encouraged increase daily water intake to 64 ounces per day while taking this medication.  Advised we will follow-up with lumbar, sacrum/coccyx and right foot x-rays once resolved.  Advised if symptoms worsen and/or unresolved please follow-up with PCP or here for further evaluation.  Patient discharged home, hemodynamically stable. Final Clinical Impressions(s) / UC Diagnoses   Final diagnoses:  Fall, initial encounter  Pain in the coccyx  Right foot pain     Discharge Instructions      Advised patient to take medication as directed with food.  Advised patient of sedative effects of this pain medication.  Encouraged increase daily water intake to 64 ounces per day while taking this medication.  Advised we will follow-up with lumbar, sacrum/coccyx and right foot x-rays once resolved.  Advised if symptoms worsen and/or unresolved please follow-up with PCP or here for further evaluation.     ED Prescriptions     Medication Sig Dispense Auth. Provider   oxyCODONE-acetaminophen (PERCOCET/ROXICET) 5-325 MG tablet Take 1 tablet by mouth every 6 (six) hours as needed for up to 7 days for severe pain (pain score 7-10). 28 tablet Trevor Iha, FNP      I have reviewed the PDMP during this encounter.   Trevor Iha, FNP 03/14/23 [phone]

## 2023-03-18 ENCOUNTER — Encounter: Payer: Self-pay | Admitting: Sports Medicine

## 2023-03-18 ENCOUNTER — Ambulatory Visit (INDEPENDENT_AMBULATORY_CARE_PROVIDER_SITE_OTHER): Payer: Medicare Other | Admitting: Sports Medicine

## 2023-03-18 ENCOUNTER — Ambulatory Visit: Payer: Medicare Other

## 2023-03-18 DIAGNOSIS — R9389 Abnormal findings on diagnostic imaging of other specified body structures: Secondary | ICD-10-CM | POA: Diagnosis not present

## 2023-03-18 DIAGNOSIS — A419 Sepsis, unspecified organism: Secondary | ICD-10-CM

## 2023-03-18 DIAGNOSIS — M48062 Spinal stenosis, lumbar region with neurogenic claudication: Secondary | ICD-10-CM | POA: Diagnosis not present

## 2023-03-18 DIAGNOSIS — D72823 Leukemoid reaction: Secondary | ICD-10-CM | POA: Diagnosis not present

## 2023-03-18 DIAGNOSIS — S2241XA Multiple fractures of ribs, right side, initial encounter for closed fracture: Secondary | ICD-10-CM | POA: Diagnosis not present

## 2023-03-18 DIAGNOSIS — R509 Fever, unspecified: Secondary | ICD-10-CM | POA: Diagnosis not present

## 2023-03-18 DIAGNOSIS — Z96612 Presence of left artificial shoulder joint: Secondary | ICD-10-CM | POA: Diagnosis not present

## 2023-03-18 HISTORY — DX: Sepsis, unspecified organism: A41.9

## 2023-03-18 MED ORDER — DOXYCYCLINE HYCLATE 100 MG PO TABS: 100.0000 mg | ORAL_TABLET | Freq: Two times a day (BID) | ORAL | 0 refills | Status: DC

## 2023-03-18 MED ORDER — METRONIDAZOLE 500 MG PO TABS: 500.0000 mg | ORAL_TABLET | Freq: Two times a day (BID) | ORAL | 0 refills | Status: DC

## 2023-03-18 MED ORDER — HYDROMORPHONE HCL 4 MG PO TABS: 4.0000 mg | ORAL_TABLET | Freq: Four times a day (QID) | ORAL | 0 refills | Status: DC | PRN

## 2023-03-18 NOTE — Progress Notes (Signed)
    Procedures performed today:    None.  Independent interpretation of notes and tests performed by another provider:   None.  Brief History, Exam, Impression, and Recommendations:    Sepsis [facility]) Pleasant 77 year old male, he has had weeks to months of intermittent fevers, measured up to well over 100. He does not have any obvious sources of infection, no cough, runny nose, abdominal pain, nausea, vomiting, diarrhea, dysuria. Unclear etiology however on exam he does have warmth and erythema in the posterior sacral region concerning for cellulitis. Due to persistence of fevers and lack of a diagnosis or source we are going to do an investigation. CBC with differential, CMP, ESR, urinalysis, blood cultures, chest x-ray, echocardiogram. Pelvic CT with and without contrast looking for a posterior sacral collection. Doxycycline, metronidazole.  Update: WBC count very elevated at 23,000, this likely represents a leukemoid response to infection versus potentially CLL considering chronicity of his symptoms. I would like consultation with hematology. If not starting to feel significantly better after a couple of days of the antibiotics we will likely need to consider admission.  Update 03/19/23:  Noted GPC in clusters in blood cultures, CT pelvis with contrast performed today due to clinical findings yesterday of repair to be a sacral cellulitis.  Official report not back yet but I do see some fat stranding in the sacral subcutaneous tissues suspicious for cellulitis.  Combined with severe leukocytosis I have called the patient and asked him to proceed to [facility] emergency department.  I spoke to the ED charge nurse and she is aware that the patient is coming with the likely diagnosis of sepsis.  Lumbar stenosis with neurogenic claudication Known lumbar spinal stenosis, he had a recent fall, x-rays of the sacrum and lumbar spine were negative for acute fracture. He also has what appears to  be a cellulitis posterior sacral. Percocet has not been effective in the past, adding Dilaudid 4 mg 4 times daily for pain.  I spent 40 minutes of total time managing this patient today, this includes chart review, face to face, and non-face to face time.  We did consider admission.  ____________________________________________ [name]. [name], M.D., ABFM., CAQSM., AME. Primary Care and Sports Medicine [facility]  Adjunct Professor of Family Medicine  [employer]  Restaurant manager, fast food

## 2023-03-18 NOTE — Assessment & Plan Note (Signed)
Known lumbar spinal stenosis, he had a recent fall, x-rays of the sacrum and lumbar spine were negative for acute fracture. He also has what appears to be a cellulitis posterior sacral. Percocet has not been effective in the past, adding Dilaudid 4 mg 4 times daily for pain.

## 2023-03-18 NOTE — Assessment & Plan Note (Addendum)
Pleasant 77 year old male, he has had weeks to months of intermittent fevers, measured up to well over 100. He does not have any obvious sources of infection, no cough, runny nose, abdominal pain, nausea, vomiting, diarrhea, dysuria. Unclear etiology however on exam he does have warmth and erythema in the posterior sacral region concerning for cellulitis. Due to persistence of fevers and lack of a diagnosis or source we are going to do an investigation. CBC with differential, CMP, ESR, urinalysis, blood cultures, chest x-ray, echocardiogram. Pelvic CT with and without contrast looking for a posterior sacral collection. Doxycycline, metronidazole.  Update: WBC count very elevated at 23,000, this likely represents a leukemoid response to infection versus potentially CLL considering chronicity of his symptoms. I would like consultation with hematology. If not starting to feel significantly better after a couple of days of the antibiotics we will likely need to consider admission.  Update 03/19/23:  Noted GPC in clusters in blood cultures, CT pelvis with contrast performed today due to clinical findings yesterday of repair to be a sacral cellulitis.  Official report not back yet but I do see some fat stranding in the sacral subcutaneous tissues suspicious for cellulitis.  Combined with severe leukocytosis I have called the patient and asked him to proceed to [facility] emergency department.  I spoke to the ED charge nurse and she is aware that the patient is coming with the likely diagnosis of sepsis.

## 2023-03-19 ENCOUNTER — Other Ambulatory Visit: Payer: Self-pay

## 2023-03-19 ENCOUNTER — Telehealth: Payer: Self-pay

## 2023-03-19 ENCOUNTER — Ambulatory Visit (HOSPITAL_BASED_OUTPATIENT_CLINIC_OR_DEPARTMENT_OTHER)
Admission: RE | Admit: 2023-03-19 | Discharge: 2023-03-19 | Disposition: A | Discharge status: Home / Self Care | Payer: Medicare Other | Source: Ambulatory Visit | Attending: Sports Medicine | Admitting: Sports Medicine

## 2023-03-19 ENCOUNTER — Encounter (HOSPITAL_COMMUNITY): Payer: Self-pay | Admitting: *Deleted

## 2023-03-19 ENCOUNTER — Inpatient Hospital Stay (HOSPITAL_COMMUNITY)
Admission: EM | Admit: 2023-03-19 | Discharge: 2023-03-24 | DRG: 300 | Disposition: A | Discharge status: Home Health | Payer: Medicare Other | Source: Ambulatory Visit | Attending: Internal Medicine | Admitting: Internal Medicine

## 2023-03-19 ENCOUNTER — Telehealth: Payer: Self-pay | Admitting: Sports Medicine

## 2023-03-19 DIAGNOSIS — M48062 Spinal stenosis, lumbar region with neurogenic claudication: Secondary | ICD-10-CM | POA: Diagnosis present

## 2023-03-19 DIAGNOSIS — G629 Polyneuropathy, unspecified: Secondary | ICD-10-CM | POA: Diagnosis not present

## 2023-03-19 DIAGNOSIS — J439 Emphysema, unspecified: Secondary | ICD-10-CM | POA: Diagnosis present

## 2023-03-19 DIAGNOSIS — Z96643 Presence of artificial hip joint, bilateral: Secondary | ICD-10-CM | POA: Diagnosis present

## 2023-03-19 DIAGNOSIS — Z951 Presence of aortocoronary bypass graft: Secondary | ICD-10-CM

## 2023-03-19 DIAGNOSIS — Z881 Allergy status to other antibiotic agents status: Secondary | ICD-10-CM

## 2023-03-19 DIAGNOSIS — Z79899 Other long term (current) drug therapy: Secondary | ICD-10-CM

## 2023-03-19 DIAGNOSIS — B9561 Methicillin susceptible Staphylococcus aureus infection as the cause of diseases classified elsewhere: Secondary | ICD-10-CM | POA: Diagnosis not present

## 2023-03-19 DIAGNOSIS — M48061 Spinal stenosis, lumbar region without neurogenic claudication: Secondary | ICD-10-CM | POA: Diagnosis not present

## 2023-03-19 DIAGNOSIS — I2510 Atherosclerotic heart disease of native coronary artery without angina pectoris: Secondary | ICD-10-CM | POA: Diagnosis present

## 2023-03-19 DIAGNOSIS — N400 Enlarged prostate without lower urinary tract symptoms: Secondary | ICD-10-CM | POA: Diagnosis not present

## 2023-03-19 DIAGNOSIS — I110 Hypertensive heart disease with heart failure: Secondary | ICD-10-CM | POA: Diagnosis present

## 2023-03-19 DIAGNOSIS — J4489 Other specified chronic obstructive pulmonary disease: Secondary | ICD-10-CM | POA: Diagnosis present

## 2023-03-19 DIAGNOSIS — Z8042 Family history of malignant neoplasm of prostate: Secondary | ICD-10-CM

## 2023-03-19 DIAGNOSIS — Z823 Family history of stroke: Secondary | ICD-10-CM

## 2023-03-19 DIAGNOSIS — R7881 Bacteremia: Secondary | ICD-10-CM | POA: Diagnosis not present

## 2023-03-19 DIAGNOSIS — Z7901 Long term (current) use of anticoagulants: Secondary | ICD-10-CM | POA: Diagnosis not present

## 2023-03-19 DIAGNOSIS — K5730 Diverticulosis of large intestine without perforation or abscess without bleeding: Secondary | ICD-10-CM | POA: Diagnosis not present

## 2023-03-19 DIAGNOSIS — M5126 Other intervertebral disc displacement, lumbar region: Secondary | ICD-10-CM | POA: Diagnosis not present

## 2023-03-19 DIAGNOSIS — E785 Hyperlipidemia, unspecified: Secondary | ICD-10-CM | POA: Diagnosis not present

## 2023-03-19 DIAGNOSIS — R509 Fever, unspecified: Secondary | ICD-10-CM | POA: Insufficient documentation

## 2023-03-19 DIAGNOSIS — E039 Hypothyroidism, unspecified: Secondary | ICD-10-CM | POA: Diagnosis present

## 2023-03-19 DIAGNOSIS — I480 Paroxysmal atrial fibrillation: Secondary | ICD-10-CM | POA: Diagnosis present

## 2023-03-19 DIAGNOSIS — I5022 Chronic systolic (congestive) heart failure: Secondary | ICD-10-CM | POA: Diagnosis present

## 2023-03-19 DIAGNOSIS — D849 Immunodeficiency, unspecified: Secondary | ICD-10-CM | POA: Diagnosis not present

## 2023-03-19 DIAGNOSIS — Z8249 Family history of ischemic heart disease and other diseases of the circulatory system: Secondary | ICD-10-CM

## 2023-03-19 DIAGNOSIS — Z7951 Long term (current) use of inhaled steroids: Secondary | ICD-10-CM

## 2023-03-19 DIAGNOSIS — M7989 Other specified soft tissue disorders: Secondary | ICD-10-CM | POA: Diagnosis not present

## 2023-03-19 DIAGNOSIS — Z96612 Presence of left artificial shoulder joint: Secondary | ICD-10-CM | POA: Diagnosis present

## 2023-03-19 DIAGNOSIS — Z809 Family history of malignant neoplasm, unspecified: Secondary | ICD-10-CM

## 2023-03-19 DIAGNOSIS — Z833 Family history of diabetes mellitus: Secondary | ICD-10-CM

## 2023-03-19 DIAGNOSIS — I340 Nonrheumatic mitral (valve) insufficiency: Secondary | ICD-10-CM | POA: Diagnosis not present

## 2023-03-19 DIAGNOSIS — J449 Chronic obstructive pulmonary disease, unspecified: Secondary | ICD-10-CM | POA: Diagnosis present

## 2023-03-19 DIAGNOSIS — M4316 Spondylolisthesis, lumbar region: Secondary | ICD-10-CM | POA: Diagnosis not present

## 2023-03-19 DIAGNOSIS — M810 Age-related osteoporosis without current pathological fracture: Secondary | ICD-10-CM | POA: Diagnosis present

## 2023-03-19 DIAGNOSIS — I4891 Unspecified atrial fibrillation: Secondary | ICD-10-CM | POA: Diagnosis not present

## 2023-03-19 DIAGNOSIS — R000 Tachycardia, unspecified: Secondary | ICD-10-CM | POA: Diagnosis not present

## 2023-03-19 DIAGNOSIS — Z800 Family history of malignant neoplasm of digestive organs: Secondary | ICD-10-CM

## 2023-03-19 DIAGNOSIS — L97819 Non-pressure chronic ulcer of other part of right lower leg with unspecified severity: Secondary | ICD-10-CM | POA: Diagnosis not present

## 2023-03-19 DIAGNOSIS — Z96653 Presence of artificial knee joint, bilateral: Secondary | ICD-10-CM | POA: Diagnosis not present

## 2023-03-19 DIAGNOSIS — R350 Frequency of micturition: Secondary | ICD-10-CM | POA: Diagnosis present

## 2023-03-19 DIAGNOSIS — J45909 Unspecified asthma, uncomplicated: Secondary | ICD-10-CM | POA: Diagnosis not present

## 2023-03-19 DIAGNOSIS — Z981 Arthrodesis status: Secondary | ICD-10-CM

## 2023-03-19 DIAGNOSIS — A419 Sepsis, unspecified organism: Secondary | ICD-10-CM | POA: Diagnosis not present

## 2023-03-19 DIAGNOSIS — Z825 Family history of asthma and other chronic lower respiratory diseases: Secondary | ICD-10-CM

## 2023-03-19 DIAGNOSIS — I872 Venous insufficiency (chronic) (peripheral): Secondary | ICD-10-CM | POA: Diagnosis not present

## 2023-03-19 DIAGNOSIS — Z83438 Family history of other disorder of lipoprotein metabolism and other lipidemia: Secondary | ICD-10-CM

## 2023-03-19 DIAGNOSIS — R6889 Other general symptoms and signs: Principal | ICD-10-CM

## 2023-03-19 DIAGNOSIS — Z83719 Family history of colon polyps, unspecified: Secondary | ICD-10-CM

## 2023-03-19 DIAGNOSIS — Z7952 Long term (current) use of systemic steroids: Secondary | ICD-10-CM

## 2023-03-19 DIAGNOSIS — I330 Acute and subacute infective endocarditis: Secondary | ICD-10-CM | POA: Diagnosis not present

## 2023-03-19 DIAGNOSIS — K219 Gastro-esophageal reflux disease without esophagitis: Secondary | ICD-10-CM | POA: Diagnosis present

## 2023-03-19 DIAGNOSIS — Z91199 Patient's noncompliance with other medical treatment and regimen due to unspecified reason: Secondary | ICD-10-CM

## 2023-03-19 HISTORY — DX: Methicillin susceptible Staphylococcus aureus infection as the cause of diseases classified elsewhere: B95.61

## 2023-03-19 LAB — CBC WITH DIFFERENTIAL/PLATELET
Abs Immature Granulocytes: 0.46 10*3/uL — ABNORMAL HIGH (ref 0.00–0.07)
Basophils Absolute: 0.1 10*3/uL (ref 0.0–0.2)
Basophils Absolute: 0 10*3/uL (ref 0.0–0.1)
Basophils Relative: 0 %
Basos: 0 %
EOS (ABSOLUTE): 0 10*3/uL (ref 0.0–0.4)
Eos: 0 %
Eosinophils Absolute: 0 10*3/uL (ref 0.0–0.5)
Eosinophils Relative: 0 %
HCT: 33.4 % — ABNORMAL LOW (ref 39.0–52.0)
Hematocrit: 37.2 % — ABNORMAL LOW (ref 37.5–51.0)
Hemoglobin: 12.4 g/dL — ABNORMAL LOW (ref 13.0–17.7)
Hemoglobin: 11 g/dL — ABNORMAL LOW (ref 13.0–17.0)
Immature Grans (Abs): 0.4 x10E3/uL — ABNORMAL HIGH (ref 0.0–0.1)
Immature Granulocytes: 2 %
Immature Granulocytes: 2 %
Lymphocytes Absolute: 1 10*3/uL (ref 0.7–3.1)
Lymphocytes Relative: 4 %
Lymphs Abs: 0.9 10*3/uL (ref 0.7–4.0)
Lymphs: 4 %
MCH: 29.7 pg (ref 26.6–33.0)
MCH: 30.1 pg (ref 26.0–34.0)
MCHC: 33.3 g/dL (ref 31.5–35.7)
MCHC: 32.9 g/dL (ref 30.0–36.0)
MCV: 89 fL (ref 79–97)
MCV: 91.3 fL (ref 80.0–100.0)
Monocytes Absolute: 1.7 10*3/uL — ABNORMAL HIGH (ref 0.1–0.9)
Monocytes Absolute: 1.1 10*3/uL — ABNORMAL HIGH (ref 0.1–1.0)
Monocytes Relative: 5 %
Monocytes: 8 %
Neutro Abs: 20.2 10*3/uL — ABNORMAL HIGH (ref 1.7–7.7)
Neutrophils Absolute: 20.1 10*3/uL — ABNORMAL HIGH (ref 1.4–7.0)
Neutrophils Relative %: 89 %
Neutrophils: 86 %
Platelets: 206 10*3/uL (ref 150–450)
Platelets: 177 10*3/uL (ref 150–400)
RBC: 4.17 x10E6/uL (ref 4.14–5.80)
RBC: 3.66 MIL/uL — ABNORMAL LOW (ref 4.22–5.81)
RDW: 15.7 % — ABNORMAL HIGH (ref 11.6–15.4)
RDW: 16.8 % — ABNORMAL HIGH (ref 11.5–15.5)
WBC: 23.3 10*3/uL (ref 3.4–10.8)
WBC: 22.6 10*3/uL — ABNORMAL HIGH (ref 4.0–10.5)
nRBC: 0 % (ref 0.0–0.2)

## 2023-03-19 LAB — URINALYSIS, W/ REFLEX TO CULTURE (INFECTION SUSPECTED)
Bacteria, UA: NONE SEEN
Bilirubin Urine: NEGATIVE
Glucose, UA: NEGATIVE mg/dL
Ketones, ur: NEGATIVE mg/dL
Nitrite: NEGATIVE
Protein, ur: NEGATIVE mg/dL
Specific Gravity, Urine: >1.046 — ABNORMAL HIGH (ref 1.005–1.030)
pH: 5 (ref 5.0–8.0)

## 2023-03-19 LAB — MICROSCOPIC EXAMINATION
Bacteria, UA: NONE SEEN
Casts: NONE SEEN /[LPF]
Epithelial Cells (non renal): NONE SEEN /[HPF] (ref 0–10)
RBC, Urine: NONE SEEN /[HPF] (ref 0–2)
WBC, UA: NONE SEEN /[HPF] (ref 0–5)

## 2023-03-19 LAB — COMPREHENSIVE METABOLIC PANEL
ALT: 18 [IU]/L (ref 0–44)
ALT: 23 U/L (ref 0–44)
AST: 25 [IU]/L (ref 0–40)
AST: 34 U/L (ref 15–41)
Albumin: 3.9 g/dL (ref 3.8–4.8)
Albumin: 2.7 g/dL — ABNORMAL LOW (ref 3.5–5.0)
Alkaline Phosphatase: 93 [IU]/L (ref 44–121)
Alkaline Phosphatase: 103 U/L (ref 38–126)
Anion gap: 13 (ref 5–15)
BUN/Creatinine Ratio: 18 (ref 10–24)
BUN: 19 mg/dL (ref 8–27)
BUN: 23 mg/dL (ref 8–23)
Bilirubin Total: 1.2 mg/dL (ref 0.0–1.2)
CO2: 21 mmol/L (ref 20–29)
CO2: 21 mmol/L — ABNORMAL LOW (ref 22–32)
Calcium: 9.1 mg/dL (ref 8.6–10.2)
Calcium: 8.9 mg/dL (ref 8.9–10.3)
Chloride: 96 mmol/L (ref 96–106)
Chloride: 99 mmol/L (ref 98–111)
Creatinine, Ser: 1.04 mg/dL (ref 0.76–1.27)
Creatinine, Ser: 1.09 mg/dL (ref 0.61–1.24)
GFR, Estimated: >60 mL/min (ref >60)
Globulin, Total: 2 g/dL (ref 1.5–4.5)
Glucose, Bld: 149 mg/dL — ABNORMAL HIGH (ref 70–99)
Glucose: 91 mg/dL (ref 70–99)
Potassium: 3.6 mmol/L (ref 3.5–5.2)
Potassium: 3.4 mmol/L — ABNORMAL LOW (ref 3.5–5.1)
Sodium: 136 mmol/L (ref 134–144)
Sodium: 133 mmol/L — ABNORMAL LOW (ref 135–145)
Total Bilirubin: 1.1 mg/dL (ref 0.3–1.2)
Total Protein: 5.9 g/dL — ABNORMAL LOW (ref 6.0–8.5)
Total Protein: 5.9 g/dL — ABNORMAL LOW (ref 6.5–8.1)
eGFR: 75 mL/min/{1.73_m2} (ref >59)

## 2023-03-19 LAB — UA/M W/RFLX CULTURE, ROUTINE
Bilirubin, UA: NEGATIVE
Glucose, UA: NEGATIVE
Ketones, UA: NEGATIVE
Leukocytes,UA: NEGATIVE
Nitrite, UA: NEGATIVE
RBC, UA: NEGATIVE
Specific Gravity, UA: 1.014 (ref 1.005–1.030)
Urobilinogen, Ur: 1 mg/dL (ref 0.2–1.0)
pH, UA: 7 (ref 5.0–7.5)

## 2023-03-19 LAB — I-STAT CG4 LACTIC ACID, ED: Lactic Acid, Venous: 1.4 mmol/L (ref 0.5–1.9)

## 2023-03-19 LAB — SEDIMENTATION RATE: Sed Rate: 51 mm/h — ABNORMAL HIGH (ref 0–30)

## 2023-03-19 LAB — PROTIME-INR
INR: 1.8 — ABNORMAL HIGH (ref 0.8–1.2)
Prothrombin Time: 21.1 s — ABNORMAL HIGH (ref 11.4–15.2)

## 2023-03-19 LAB — C-REACTIVE PROTEIN: CRP: 233 mg/L — ABNORMAL HIGH (ref 0–10)

## 2023-03-19 MED ORDER — IOHEXOL 300 MG/ML  SOLN
100.0000 mL | Freq: Once | INTRAMUSCULAR | Status: AC | PRN
  Administered 2023-03-19: 100 mL via INTRAVENOUS

## 2023-03-19 MED ORDER — LACTATED RINGERS IV BOLUS (SEPSIS)
1000.0000 mL | Freq: Once | INTRAVENOUS | Status: AC
  Administered 2023-03-19: 1000 mL via INTRAVENOUS

## 2023-03-19 MED ORDER — VANCOMYCIN HCL 2000 MG/400ML IV SOLN
2000.0000 mg | Freq: Once | INTRAVENOUS | Status: AC
  Administered 2023-03-19: 2000 mg via INTRAVENOUS
  Filled 2023-03-19: qty 400

## 2023-03-19 MED ORDER — METRONIDAZOLE 500 MG/100ML IV SOLN
500.0000 mg | Freq: Once | INTRAVENOUS | Status: AC
Start: 2023-03-19 — End: 2023-03-19
  Administered 2023-03-19: 500 mg via INTRAVENOUS
  Filled 2023-03-19: qty 100

## 2023-03-19 MED ORDER — VANCOMYCIN HCL IN DEXTROSE 1-5 GM/200ML-% IV SOLN: 1000.0000 mg | Freq: Once | INTRAVENOUS | Status: DC

## 2023-03-19 MED ORDER — SODIUM CHLORIDE 0.9 % IV SOLN
2.0000 g | Freq: Once | INTRAVENOUS | Status: AC
Start: 2023-03-19 — End: 2023-03-19
  Administered 2023-03-19: 2 g via INTRAVENOUS
  Filled 2023-03-19: qty 12.5

## 2023-03-19 MED ORDER — FENTANYL CITRATE PF 50 MCG/ML IJ SOSY
50.0000 ug | PREFILLED_SYRINGE | Freq: Once | INTRAMUSCULAR | Status: AC
  Administered 2023-03-19: 50 ug via INTRAVENOUS
  Filled 2023-03-19: qty 1

## 2023-03-19 NOTE — Telephone Encounter (Signed)
Patient called in stating that the pharmacy does not have HYDROmorphone (DILAUDID) 4 MG tablet in stock and they are requesting a substitute

## 2023-03-19 NOTE — H&P (Signed)
History and Physical    Levi [name]:[id number] DOB: 08-14-1945 DOA: 03/19/2023  PCP: Monica Becton, MD  Patient coming from: Home  I have personally briefly reviewed patient's old medical records in [facility]  Chief Complaint: Bacteremia  HPI: Levi [name] is a 77 y.o. male with medical history significant for PAF on Eliquis, COPD, HTN, chronic lower extremity edema, hx of lumbosacral fusion who presented to the ED for management of bacteremia.  Patient has been following with his PCP for evaluation of intermittent fevers ongoing for***.  Lab work obtained yesterday (03/18/2023) showed leukocytosis with WBC 23,000, ESR 51, CRP 233.  UA was negative for UTI.  Single blood culture obtained 03/18/23 is growing gram-positive cocci in clusters.  2 view chest x-ray was negative for focal consolidation, edema, effusion.  CT pelvis with contrast performed today showed mild subcutaneous edema of the inferior gluteal regions, left greater than right, and along the gluteal cleft which may represent dependent edema or infection.  No drainable collection, no soft tissue gas.  Right superior and inferior pubic rami fractures are subacute or chronic with incomplete callus formation.  Bilateral hip arthroplasties without periprosthetic lucency or fracture.  Lumbosacral fusion hardware seen.  Fracture of right S1 screw was better appreciated on the recent radiograph.  No evidence of musculoskeletal infection.  Patient was notified of the results of the blood culture and was sent to the ED for further evaluation.***  ED Course  Labs/Imaging on admission: I have personally reviewed following labs and imaging studies.  Initial vitals showed BP 118/79, pulse 102, RR 26, temp 98.8 F, SpO2 95% on room air.  Labs show WBC 22.6, hemoglobin 11.0, platelets 177,000, sodium 133, potassium 3.4, bicarb 21, BUN 23, creatinine 1.09, serum glucose 149, LFTs within normal limits, lactic acid  1.4.  Urinalysis showed negative nitrites, trace leukocytes, 11-20 RBCs, 0-5 WBCs, no bacteria on microscopy.  Blood cultures in process.  Patient was given 3 L LR, IV vancomycin, cefepime, Flagyl.  The hospitalist service was consulted to admit for further evaluation and management.  Review of Systems: All systems reviewed and are negative except as documented in history of present illness above.   Past Medical History:  Diagnosis Date   Agent orange exposure 1970's   takes Imdur,Diltiazem, and Atacand daily   ALLERGIC RHINITIS    Allergy    seasonal allergies   Asthma    uses inhaler and nebulizers   Bruises easily    d/t prednisone daily   Cataract    CHF (congestive heart failure) (HCC)    dx-on meds to treat   Chronic bronchitis (HCC)    "used to get it q yr; last time ?2008" (04/28/2012)   Clotting disorder (HCC) 2019   On blood thinner when I bleed it is for a consideral amount of time. This is tegardless of size of wound.   COPD (chronic obstructive pulmonary disease) (HCC)    agent orange exposure   Degenerative disk disease    "everywhere" (04/28/2012)   Diverticulitis    Dysrhythmia    afib   Elevated uric acid in blood    takes Allopurinol daily   Emphysema of lung (HCC)    Enlarged prostate    but not on any meds   GERD (gastroesophageal reflux disease)    takes Protonix daily   H/O hiatal hernia    Headache    History of pneumonia    a. 2010   Hyperlipidemia  takes Lipitor daily; pt. states he takes a preventive   Hypothyroidism    Joint pain    Joint swelling    Nausea and vomiting 11/12/2022   Neuromuscular disorder (HCC)    bilat legs, and bilat arms   Osteoporosis 2012   PAF (paroxysmal atrial fibrillation) (HCC)    a. Dx 04/2012, CHA2DS2VASc = 1 (age);  b. 04/2012 Echo: EF 40-50%, mild MR.   Personal history of colonic adenomas 02/09/2013   Pneumonia    Shortness of breath dyspnea    Thyroid disease    no taking meds-caused dizziness     Past Surgical History:  Procedure Laterality Date   47 HOUR PH STUDY N/A 05/30/2021   Procedure: 24 HOUR PH STUDY;  Surgeon: Shellia Cleverly, DO;  Location: [facility];  Service: Gastroenterology;  Laterality: N/A;   ANTERIOR CERVICAL DECOMP/DISCECTOMY FUSION N/A 07/21/2012   Procedure: ANTERIOR CERVICAL DECOMPRESSION/DISCECTOMY FUSION 2 LEVELS;  Surgeon: Barnett Abu, MD;  Location: [facility];  Service: Neurosurgery;  Laterality: N/A;  C4-5 C5-6 Anterior cervical decompression/diskectomy/fusion   ANTERIOR LAT LUMBAR FUSION N/A 04/07/2017   Procedure: Thoracic twelve-Lumbar one Anterolateral decompression/fusion;  Surgeon: Barnett Abu, MD;  Location: [facility] OR;  Service: Neurosurgery;  Laterality: N/A;   ARTHRODESIS FOOT WITH WEIL OSTEOTOMY Left 11/07/2022   Procedure: LEFT THIRD METATARSOPHALANGEAL CAPSULOTOMY, WEIL OSTEOTOMY;  Surgeon: Terance Hart, MD;  Location: [facility] OR;  Service: Orthopedics;  Laterality: Left;  LENGTH OF SURGERY: 60 MINUTES   BACK SURGERY     x 3   CARDIAC CATHETERIZATION  11/2008   Dr. Anne Fu - 20% calcified non flow limiting left main, 50% EF apical hypokinesis   CERVICAL DISC ARTHROPLASTY N/A 04/07/2017   Procedure: Cervical six-seven Disc arthroplasty;  Surgeon: Barnett Abu, MD;  Location: [facility] OR;  Service: Neurosurgery;  Laterality: N/A;   CHEST TUBE INSERTION Left 04/11/2017   Procedure: CHEST TUBE INSERTION;  Surgeon: Kerin Perna, MD;  Location: [facility] OR;  Service: Thoracic;  Laterality: Left;   CHOLECYSTECTOMY     COLONOSCOPY  2016   CG-MAC-miralax(good)servere TICS/TA x 2   ESOPHAGEAL MANOMETRY N/A 05/30/2021   Procedure: ESOPHAGEAL MANOMETRY (EM);  Surgeon: Shellia Cleverly, DO;  Location: [facility];  Service: Gastroenterology;  Laterality: N/A;  ph impedence   ESOPHAGOGASTRODUODENOSCOPY     EYE SURGERY Bilateral 2011   steroidal encapsulation" (04/28/2012)   FOOT SURGERY Right    x 2   JOINT REPLACEMENT     Many surgeries  including 5 joit replacements   KNEE ARTHROSCOPY Bilateral    LATERAL / POSTERIOR COMBINED FUSION LUMBAR SPINE  2012   LEFT ATRIAL APPENDAGE OCCLUSION N/A 02/22/2021   Procedure: LEFT ATRIAL APPENDAGE OCCLUSION;  Surgeon: Lanier Prude, MD;  [facility]: [facility];  Service: Cardiovascular;  Laterality: N/A;   POLYPECTOMY  2016   severe TICS/TA x 2   POSTERIOR CERVICAL LAMINECTOMY WITH MET- RX Right 06/11/2021   Procedure: Right Cervical six-seven  Laminectomy and foraminotomy with metrex;  Surgeon: Barnett Abu, MD;  Location: [facility] OR;  Service: Neurosurgery;  Laterality: Right;   REVERSE SHOULDER ARTHROPLASTY  04/28/2012   Procedure: REVERSE SHOULDER ARTHROPLASTY;  Surgeon: Mable Paris, MD;  Location: [facility] OR;  Service: Orthopedics;  Laterality: Left;  Left shouder reverse total shoulder arthroplasty   SHOULDER SURGERY     SPINE SURGERY     I have had 7 spinal surgeries   TEE WITHOUT CARDIOVERSION N/A 02/22/2021   Procedure: TRANSESOPHAGEAL ECHOCARDIOGRAM (TEE);  Surgeon:  Lanier Prude, MD;  Location: [facility] INVASIVE CV LAB;  Service: Cardiovascular;  Laterality: N/A;   TENDON REPAIR Right 08/07/2020   Procedure: RIGHT HAND LIGAMENT RECONSTRUCTION AND TENDON INTERPOSITION;  Surgeon: Jodi Geralds, MD;  Location: [facility];  Service: Orthopedics;  Laterality: Right;   TONSILLECTOMY AND ADENOIDECTOMY  1954   TOTAL HIP ARTHROPLASTY Left 2011   "left" (04/28/2012)   TOTAL HIP ARTHROPLASTY Right 07/31/2015   Procedure: TOTAL HIP ARTHROPLASTY ANTERIOR APPROACH;  Surgeon: Jodi Geralds, MD;  Location: [facility] OR;  Service: Orthopedics;  Laterality: Right;   TOTAL KNEE ARTHROPLASTY  01/10/2012   Procedure: TOTAL KNEE ARTHROPLASTY;  Surgeon: Harvie Junior, MD;  Location: [facility] OR;  Service: Orthopedics;;  left total knee arthroplasty   TOTAL KNEE ARTHROPLASTY Right 06/17/2014   Procedure: TOTAL KNEE ARTHROPLASTY;  Surgeon: Harvie Junior, MD;  Location: [facility] OR;  Service:  Orthopedics;  Laterality: Right;   toupet fundolplication  11/01/2021    Social History:  reports that he has never smoked. He has never used smokeless tobacco. He reports that he does not drink alcohol and does not use drugs.  Allergies  Allergen Reactions   Levaquin [Levofloxacin] Itching    Family History  Problem Relation Age of Onset   Diabetes Mother    Heart disease Father        Died 76, MI   Cancer Father    Hyperlipidemia Father    Stroke Father    Prostate cancer Brother    Colon polyps Brother    Colon polyps Brother    Asthma Brother    COPD Brother    Colon cancer Brother        Dx age 23   Cancer Brother    Colon cancer Other    Esophageal cancer Neg Hx    Rectal cancer Neg Hx    Stomach cancer Neg Hx    Pancreatic cancer Neg Hx    Kidney disease Neg Hx    Liver disease Neg Hx    Neuropathy Neg Hx      Prior to Admission medications   Medication Sig Start Date End Date Taking? Authorizing Provider  albuterol (VENTOLIN HFA) 108 (90 Base) MCG/ACT inhaler INHALE 2 PUFFS BY MOUTH EVERY 4 HOURS AS NEEDED FOR WHEEZE OR FOR SHORTNESS OF BREATH 01/14/23   Jetty Duhamel D, MD  allopurinol (ZYLOPRIM) 100 MG tablet Take 1 tablet (100 mg total) by mouth in the morning. 11/25/22   Monica Becton, MD  atorvastatin (LIPITOR) 20 MG tablet TAKE 1 TABLET BY MOUTH EVERY DAY 09/17/22   Jake Bathe, MD  cyclobenzaprine (FLEXERIL) 10 MG tablet Take 1 tablet (10 mg total) by mouth at bedtime. 03/12/23   Monica Becton, MD  diclofenac Sodium (VOLTAREN) 1 % GEL Apply 2 g topically 3 (three) times daily as needed (pain.).    [provider]  diltiazem (CARDIZEM CD) 180 MG 24 hr capsule TAKE 1 CAPSULE BY MOUTH EVERY DAY 10/16/22   Jake Bathe, MD  diltiazem (CARDIZEM) 30 MG tablet TAKE 1 TABLET (30 MG TOTAL) BY MOUTH AS NEEDED (HEART RATE OVER 120). 03/10/23   Jake Bathe, MD  doxycycline (VIBRA-TABS) 100 MG tablet Take 1 tablet (100 mg total) by  mouth 2 (two) times daily for 7 days. 03/18/23 03/25/23  Monica Becton, MD  DULoxetine (CYMBALTA) 30 MG capsule Take 1 capsule (30 mg total) by mouth daily. 12/24/22   Lomax, Amy, NP  ELIQUIS 5 MG  TABS tablet TAKE 1 TABLET BY MOUTH TWICE A DAY 01/28/23   Jake Bathe, MD  HYDROmorphone (DILAUDID) 4 MG tablet Take 1 tablet (4 mg total) by mouth every 6 (six) hours as needed for up to 5 days. 03/18/23 03/23/23  Monica Becton, MD  ipratropium-albuterol (DUONEB) 0.5-2.5 (3) MG/3ML SOLN 1 NEBULE EVERY 6 HOURS AS NEEDED. **J45.3** 03/22/20   Young, Joni Fears D, MD  isosorbide mononitrate (IMDUR) 30 MG 24 hr tablet TAKE 1 TABLET BY MOUTH EVERY DAY 03/10/23   Jake Bathe, MD  metroNIDAZOLE (FLAGYL) 500 MG tablet Take 1 tablet (500 mg total) by mouth 2 (two) times daily for 7 days. 03/18/23 03/25/23  Monica Becton, MD  Nebulizers (COMPRESSOR/NEBULIZER) MISC Use as directed 12/01/17   Waymon Budge, MD  Olodaterol HCl (STRIVERDI RESPIMAT) 2.5 MCG/ACT AERS INHALE 2 PUFFS BY MOUTH INTO THE LUNGS DAILY 03/10/23   Jetty Duhamel D, MD  pantoprazole (PROTONIX) 40 MG tablet TAKE 1 TABLET (40 MG TOTAL) BY MOUTH TWICE A DAY BEFORE MEALS 11/04/22   Monica Becton, MD  potassium chloride SA (KLOR-CON M20) 20 MEQ tablet Take 1 tablet (20 mEq total) by mouth daily. 09/05/21   Jake Bathe, MD  predniSONE (DELTASONE) 10 MG tablet Take 1 tablet (10 mg total) by mouth in the morning and at bedtime. 10/30/22   Waymon Budge, MD  pregabalin (LYRICA) 75 MG capsule Take 1 capsule (75 mg total) by mouth 3 (three) times daily. 05/16/22   Monica Becton, MD  tamsulosin (FLOMAX) 0.4 MG CAPS capsule Take 0.4 mg by mouth at bedtime.    [provider]    Physical Exam: Vitals:   03/19/23 1951 03/19/23 2003 03/19/23 2234  BP: 118/79  128/76  Pulse: (!) 102  85  Resp: (!) 26  (!) 22  Temp: 98.8 F (37.1 C)    TempSrc: Oral    SpO2: 95%  97%  Weight:  90.3 kg   Height:  6'  2" (1.88 m)    *** Constitutional: NAD, calm, comfortable Eyes: PERRL, lids and conjunctivae normal ENMT: Mucous membranes are moist. Posterior pharynx clear of any exudate or lesions.Normal dentition.  Neck: normal, supple, no masses. Respiratory: clear to auscultation bilaterally, no wheezing, no crackles. Normal respiratory effort. No accessory muscle use.  Cardiovascular: Regular rate and rhythm, no murmurs / rubs / gallops. No extremity edema. 2+ pedal pulses. Abdomen: no tenderness, no masses palpated. No hepatosplenomegaly. Bowel sounds positive.  Musculoskeletal: no clubbing / cyanosis. No joint deformity upper and lower extremities. Good ROM, no contractures. Normal muscle tone.  Skin: no rashes, lesions, ulcers. No induration Neurologic: CN 2-12 grossly intact. Sensation intact. Strength 5/5 in all 4.  Psychiatric: Normal judgment and insight. Alert and oriented x 3. Normal mood.   EKG: Personally reviewed. Sinus tachycardia, rate 105, PACs.  Rate is faster when compared to previous.  Assessment/Plan Principal Problem:   Gram-positive cocci bacteremia Active Problems:   Paroxysmal atrial fibrillation (HCC)   Hyperlipidemia   COPD (chronic obstructive pulmonary disease) (HCC)   *** No notes on file *** Assessment and Plan: Bacteremia due to GPC in clusters: ***  Paroxysmal atrial fibrillation: ***  COPD: ***  Hyperlipidemia: ***   DVT prophylaxis: ***  Code Status: ***  Family Communication: ***  Disposition Plan: ***  Consults called: ***  Severity of Illness: {Observation/Inpatient:21159}  Darreld Mclean MD [facility]  If 7PM-7AM, please contact night-coverage [email or web address]  03/19/2023, 11:32 PM

## 2023-03-19 NOTE — Telephone Encounter (Signed)
Critical lab value results call Received from  [place] [facility] - [phone]  Aerobic and anaerobic bottles both show  Gram positive cocci in clusters.

## 2023-03-19 NOTE — Addendum Note (Signed)
Addended by: Monica Becton on: 03/19/2023 10:32 AM   Modules accepted: Orders

## 2023-03-19 NOTE — ED Provider Notes (Signed)
[place] EMERGENCY DEPARTMENT AT [facility] Provider Note   CSN: [id number] Arrival date & time: 03/19/23  4098     History {Add pertinent medical, surgical, social history, OB history to HPI:1} Chief Complaint  Patient presents with   cellulitis in his spine    Levi [name] is a 77 y.o. male with history of A-fib on Eliquis, lumbar stenosis with neurogenic claudication, COPD, systolic heart failure, hypertension, was told to present to the ER from his primary care provider.  He had a mechanical fall on 03/12/2023 where he landed on his low back.  Since the fall, has been reporting low back pain, fevers to 100.5, increased fatigue.  Reports taking Tylenol for his fevers which helps to bring them down.  He saw his primary care who obtained a CT scan which revealed edema in the inferior gluteal regions, concerning for possible infection as well as a leukocytosis of 22.6.  There is also gram-positive cocci in clusters on his blood cultures from yesterday. He was referred to the ED for further evaluation.  HPI     Home Medications Prior to Admission medications   Medication Sig Start Date End Date Taking? Authorizing Provider  albuterol (VENTOLIN HFA) 108 (90 Base) MCG/ACT inhaler INHALE 2 PUFFS BY MOUTH EVERY 4 HOURS AS NEEDED FOR WHEEZE OR FOR SHORTNESS OF BREATH 01/14/23   Jetty Duhamel D, MD  allopurinol (ZYLOPRIM) 100 MG tablet Take 1 tablet (100 mg total) by mouth in the morning. 11/25/22   Monica Becton, MD  atorvastatin (LIPITOR) 20 MG tablet TAKE 1 TABLET BY MOUTH EVERY DAY 09/17/22   Jake Bathe, MD  cyclobenzaprine (FLEXERIL) 10 MG tablet Take 1 tablet (10 mg total) by mouth at bedtime. 03/12/23   Monica Becton, MD  diclofenac Sodium (VOLTAREN) 1 % GEL Apply 2 g topically 3 (three) times daily as needed (pain.).    [provider]  diltiazem (CARDIZEM CD) 180 MG 24 hr capsule TAKE 1 CAPSULE BY MOUTH EVERY DAY 10/16/22   Jake Bathe, MD   diltiazem (CARDIZEM) 30 MG tablet TAKE 1 TABLET (30 MG TOTAL) BY MOUTH AS NEEDED (HEART RATE OVER 120). 03/10/23   Jake Bathe, MD  doxycycline (VIBRA-TABS) 100 MG tablet Take 1 tablet (100 mg total) by mouth 2 (two) times daily for 7 days. 03/18/23 03/25/23  Monica Becton, MD  DULoxetine (CYMBALTA) 30 MG capsule Take 1 capsule (30 mg total) by mouth daily. 12/24/22   Lomax, Amy, NP  ELIQUIS 5 MG TABS tablet TAKE 1 TABLET BY MOUTH TWICE A DAY 01/28/23   Jake Bathe, MD  HYDROmorphone (DILAUDID) 4 MG tablet Take 1 tablet (4 mg total) by mouth every 6 (six) hours as needed for up to 5 days. 03/18/23 03/23/23  Monica Becton, MD  ipratropium-albuterol (DUONEB) 0.5-2.5 (3) MG/3ML SOLN 1 NEBULE EVERY 6 HOURS AS NEEDED. **J45.3** 03/22/20   Young, Joni Fears D, MD  isosorbide mononitrate (IMDUR) 30 MG 24 hr tablet TAKE 1 TABLET BY MOUTH EVERY DAY 03/10/23   Jake Bathe, MD  metroNIDAZOLE (FLAGYL) 500 MG tablet Take 1 tablet (500 mg total) by mouth 2 (two) times daily for 7 days. 03/18/23 03/25/23  Monica Becton, MD  Nebulizers (COMPRESSOR/NEBULIZER) MISC Use as directed 12/01/17   Waymon Budge, MD  Olodaterol HCl (STRIVERDI RESPIMAT) 2.5 MCG/ACT AERS INHALE 2 PUFFS BY MOUTH INTO THE LUNGS DAILY 03/10/23   Jetty Duhamel D, MD  pantoprazole (PROTONIX) 40 MG tablet  TAKE 1 TABLET (40 MG TOTAL) BY MOUTH TWICE A DAY BEFORE MEALS 11/04/22   Monica Becton, MD  potassium chloride SA (KLOR-CON M20) 20 MEQ tablet Take 1 tablet (20 mEq total) by mouth daily. 09/05/21   Jake Bathe, MD  predniSONE (DELTASONE) 10 MG tablet Take 1 tablet (10 mg total) by mouth in the morning and at bedtime. 10/30/22   Waymon Budge, MD  pregabalin (LYRICA) 75 MG capsule Take 1 capsule (75 mg total) by mouth 3 (three) times daily. 05/16/22   Monica Becton, MD  tamsulosin (FLOMAX) 0.4 MG CAPS capsule Take 0.4 mg by mouth at bedtime.    [provider]      Allergies     Levaquin [levofloxacin]    Review of Systems   Review of Systems  Constitutional:  Positive for fever.  Musculoskeletal:  Positive for back pain.    Physical Exam Updated Vital Signs BP 118/79 (BP Location: Right Arm)   Pulse (!) 102   Temp 98.8 F (37.1 C) (Oral)   Resp (!) 26   Ht 6\' 2"  (1.88 m)   Wt 90.3 kg   SpO2 95%   BMI 25.56 kg/m  Physical Exam Vitals and nursing note reviewed.  Constitutional:      General: He is not in acute distress.    Appearance: He is well-developed.     Comments: Difficulty moving about in bed due to back pain  HENT:     Head: Normocephalic and atraumatic.  Eyes:     Conjunctiva/sclera: Conjunctivae normal.  Cardiovascular:     Rate and Rhythm: Regular rhythm. Tachycardia present.     Heart sounds: No murmur heard. Pulmonary:     Effort: Pulmonary effort is normal. No respiratory distress.     Breath sounds: Normal breath sounds.  Abdominal:     Palpations: Abdomen is soft.     Tenderness: There is no abdominal tenderness.  Musculoskeletal:        General: No swelling.     Cervical back: Neck supple.     Comments: No tenderness palpation of the spinous processes  Skin:    General: Skin is warm and dry.     Capillary Refill: Capillary refill takes less than 2 seconds.     Comments: Area of healing skin of the right lower extremity, no surrounding erythema or edema  Slight edema of the sacral region, no erythema.  Neurological:     Mental Status: He is alert.  Psychiatric:        Mood and Affect: Mood normal.     ED Results / Procedures / Treatments   Labs (all labs ordered are listed, but only abnormal results are displayed) Labs Reviewed  COMPREHENSIVE METABOLIC PANEL - Abnormal; Notable for the following components:      Result Value   Sodium 133 (*)    Potassium 3.4 (*)    CO2 21 (*)    Glucose, Bld 149 (*)    Total Protein 5.9 (*)    Albumin 2.7 (*)    All other components within normal limits  CBC WITH  DIFFERENTIAL/PLATELET - Abnormal; Notable for the following components:   WBC 22.6 (*)    RBC 3.66 (*)    Hemoglobin 11.0 (*)    HCT 33.4 (*)    RDW 16.8 (*)    Neutro Abs 20.2 (*)    Monocytes Absolute 1.1 (*)    Abs Immature Granulocytes 0.46 (*)    All other components  within normal limits  PROTIME-INR - Abnormal; Notable for the following components:   Prothrombin Time 21.1 (*)    INR 1.8 (*)    All other components within normal limits  CULTURE, BLOOD (ROUTINE X 2)  CULTURE, BLOOD (ROUTINE X 2)  URINALYSIS, W/ REFLEX TO CULTURE (INFECTION SUSPECTED)  I-STAT CG4 LACTIC ACID, ED  I-STAT CG4 LACTIC ACID, ED    EKG None  Radiology DG Chest 2 View  Result Date: 03/19/2023 CLINICAL DATA:  Fever of unknown origin. EXAM: CHEST - 2 VIEW COMPARISON:  Radiograph 09/05/2022 FINDINGS: Normal heart size with stable mediastinal contours. Mild chronic elevation of left hemidiaphragm. No focal airspace disease. No pulmonary edema, pleural effusion or pneumothorax. Blunting of left costophrenic angle is chronic. There are remote right anterior rib fractures with callus. Remote left rib fractures are faintly visualized. Left shoulder arthroplasty. IMPRESSION: 1. No acute chest findings. 2. Chronic elevation of left hemidiaphragm. Electronically Signed   By: Narda Rutherford M.D.   On: 03/19/2023 19:23   CT PELVIS W CONTRAST  Result Date: 03/19/2023 CLINICAL DATA:  Soft tissue infection suspected, pelvis, xray done Fevers, erythema and fullness posterior sacral, evaluate for abscess/collection. EXAM: CT PELVIS WITH CONTRAST TECHNIQUE: Multidetector CT imaging of the pelvis was performed using the standard protocol following the bolus administration of intravenous contrast. RADIATION DOSE REDUCTION: This exam was performed according to the departmental dose-optimization program which includes automated exposure control, adjustment of the mA and/or kV according to patient size and/or use of iterative  reconstruction technique. CONTRAST:  OMNIPAQUE IOHEXOL 300 MG/ML  SOLN COMPARISON:  Recent radiographs.  Abdominopelvic CT 09/25/2021 FINDINGS: Urinary Tract: Partially distended urinary bladder, normal for degree of distension. Bowel: Left colonic diverticulosis. Large volume of stool in the included colon. There is no obstruction or inflammation of pelvic bowel loops. Mild rectal wall thickening but no perirectal collection. Vascular/Lymphatic: Aortic and branch atherosclerosis. No aneurysm. No suspicious pelvic adenopathy. Reproductive: Prostatic calcifications, prostate is normal in size. No evidence of scrotal inflammation or soft tissue gas. Other: Mild subcutaneous edema of the inferior gluteal regions, left greater than right, for example series 304, image 117. There is also mild edema along the gluteal cleft series 301, image 87. No drainable collection. No soft tissue gas. No intrapelvic fluid collection. Chronic edema in the subcutaneous tissues posteriorly. Small bilateral fat containing inguinal hernias. Musculoskeletal: Bilateral hip arthroplasties. No periprosthetic lucency or fracture. There is heterotopic calcification posteriorly on the left and anteriorly on the right. Right superior and inferior pubic rami fractures are subacute or chronic with incomplete callus formation. Despite metal artifact reduction technique there is some streak artifact obscuring detailed assessment. Lumbosacral fusion hardware. The fracture of the right S1 screw was better appreciated on recent radiograph. No evidence of sacral fracture. No erosive change. Degenerative change of both sacroiliac joints. Fatty atrophy of the left psoas muscle. IMPRESSION: 1. Mild subcutaneous edema of the inferior gluteal regions, left greater than right, and along the gluteal cleft. This may be dependent edema or infection. No drainable collection. No soft tissue gas. 2. Right superior and inferior pubic rami fractures are subacute  or chronic with incomplete callus formation. 3. Bilateral hip arthroplasties without periprosthetic lucency or fracture. 4. Lumbosacral fusion hardware. The fracture of the right S1 screw was better appreciated on recent radiograph. 5. No evidence of musculoskeletal infection. 6. Large volume of stool in the included colon. Mild rectal wall thickening may represent proctitis. 7. Left colonic diverticulosis. Aortic Atherosclerosis (ICD10-I70.0). Electronically Signed  By: Narda Rutherford M.D.   On: 03/19/2023 19:22    Procedures .Critical Care  Performed by: Arabella Merles, PA-C Authorized by: Arabella Merles, PA-C   Critical care provider statement:    Critical care time (minutes):  30   Critical care was necessary to treat or prevent imminent or life-threatening deterioration of the following conditions:  Sepsis   Critical care was time spent personally by me on the following activities:  Development of treatment plan with patient or surrogate, discussions with consultants, obtaining history from patient or surrogate, examination of patient, ordering and performing treatments and interventions, ordering and review of laboratory studies and review of old charts   {Document cardiac monitor, telemetry assessment procedure when appropriate:1}  Medications Ordered in ED Medications - No data to display  ED Course/ Medical Decision Making/ A&P Clinical Course as of 03/19/23 2235  Wed Mar 19, 2023  [id number] 77 year old male complaining of sacral pain.  Had outpatient workup including a CT today which shows inflammatory changes although no discrete abscess.  He also had positive blood cultures.  Sent here for further evaluation.  Does have elevated white count and tachycardia at least meeting SIRS criteria with possible bacteremia.  Will cover with antibiotics and discussed with medicine regarding admission. [MB]    Clinical Course User Index [MB] Terrilee Files, MD   {   Click here for  ABCD2, HEART and other calculatorsREFRESH Note before signing :1}                              Medical Decision Making Risk Prescription drug management.   Differential diagnosis includes but is not limited to cellulitis, sepsis, low back pain secondary to fall, fracture  ED Course:  Patient overall well-appearing, no acute distress.  He initially was tachycardic to 102 upon arrival, slightly tachypneic with a respiratory rate at 26.  He was oxygenating well on room air, O2 sat 95%.  There is concern for sepsis due to a leukocytosis of 23 taken yesterday, and leukocytosis of 22.6 here today in the ED.  Upon review of labs from yesterday, there was a positive blood culture growing gram-positive cocci from yesterday.  CT pelvis obtained by PCP yesterday also shows some subcutaneous edema of the inferior gluteal regions, concerning for possible dependent edema or infection.  Given his vitals, leukocytosis, positive blood culture, code sepsis was called.  Patient was started on vancomycin, Flagyl, cefepime for possible cellulitic source of infection.  He was started on LR boluses. Upon re-evaluation, patient ***.    Impression: ***  Disposition:  {AF ED Dispo:29713} Return precautions given.  Lab Tests: I Ordered, and personally interpreted labs.  The pertinent results include:   CBC with leukocytosis of 22.6 Urinalysis without any sign of UTI CMP with slight hyponatremia at 133, hypokalemia at 3.4 Lactic acid at 1.4  Imaging Studies ordered: I ordered imaging studies including CT pelvis I independently visualized the imaging with scope of interpretation limited to determining acute life threatening conditions related to emergency care. Imaging showed mild subcutaneous edema along the inferior gluteal regions, concerning for dependent edema or infection. I agree with the radiologist interpretation   Consultations Obtained: I requested consultation with the hospitalist Dr Allena Katz,  and  discussed lab and imaging findings as well as pertinent plan - they recommend: admission  External records from outside source obtained and reviewed including  Blood cultures from yesterday showing gram-positive cocci in clusters CBC  from yesterday showing leukocytosis of 23.3   Co morbidities that complicate the patient evaluation  A-fib on Eliquis, lumbar stenosis with neurogenic claudication, COPD, systolic heart failure, hypertension        {Document critical care time when appropriate:1} {Document review of labs and clinical decision tools ie heart score, Chads2Vasc2 etc:1}  {Document your independent review of radiology images, and any outside records:1} {Document your discussion with family members, caretakers, and with consultants:1} {Document social determinants of health affecting pt's care:1} {Document your decision making why or why not admission, treatments were needed:1} Final Clinical Impression(s) / ED Diagnoses Final diagnoses:  None    Rx / DC Orders ED Discharge Orders     None

## 2023-03-19 NOTE — Telephone Encounter (Signed)
Noted GPC in clusters in blood cultures, CT pelvis with contrast performed today due to clinical findings yesterday of repair to be a sacral cellulitis.  Official report not back yet but I do see some fat stranding in the sacral subcutaneous tissues suspicious for cellulitis.  Combined with severe leukocytosis I have called the patient and asked him to proceed to [facility] emergency department.  I spoke to the ED charge nurse and she is aware that the patient is coming with the likely diagnosis of sepsis.

## 2023-03-19 NOTE — Progress Notes (Signed)
Elink monitoring for the code sepsis protocol.  

## 2023-03-19 NOTE — Progress Notes (Signed)
ED Pharmacy Antibiotic Sign Off An antibiotic consult was received from an ED provider for cefepime and vancomycin per pharmacy dosing for sepsis and cellulitis. A chart review was completed to assess appropriateness.  The following one time order(s) were placed per pharmacy consult:  cefepime 2 g x 1 dose vancomycin 2000 mg IV x 1 dose  Further antibiotic and/or antibiotic pharmacy consults should be ordered by the admitting provider if indicated.   Thank you for allowing pharmacy to be a part of this patient's care.   Stephenie Acres, PharmD PGY1 Pharmacy Resident 03/19/2023 9:59 PM

## 2023-03-19 NOTE — Telephone Encounter (Signed)
We will put this on standby for now, I sent him for admission for sepsis tonight.

## 2023-03-19 NOTE — ED Notes (Signed)
Admitting provider at bedside to update pt. As well as family on POC

## 2023-03-19 NOTE — ED Triage Notes (Signed)
The pt fell one week ago he has pain in his tail bone and he has cellulitis  in his spine.  He had a  c-t earlier today and he was called by his doctor and told to come in because he has sepsis

## 2023-03-19 NOTE — ED Provider Triage Note (Signed)
Emergency Medicine Provider Triage Evaluation Note  Levi [name] , a 77 y.o. male  was evaluated in triage.  Pt complains of infection.  Patient tripped over his dog and fell a week ago landing on his buttock.  He has had pain to the site since.  He also endorsed having some fever as high as 100.5, having some constipation and nausea.  Was seen by his doctor yesterday and had blood work done as well as CT scan and today he was told to come to the ER due to concerns for sepsis as his white count was elevated and a CT scan of his lumbar spine show some swelling concerning for infection.  Review of Systems  Positive: As above Negative: As above  Physical Exam  BP 118/79 (BP Location: Right Arm)   Pulse (!) 102   Temp 98.8 F (37.1 C) (Oral)   Resp (!) 26   Ht 6\' 2"  (1.88 m)   Wt 90.3 kg   SpO2 95%   BMI 25.56 kg/m  Gen:   Awake, no distress   Resp:  Normal effort  MSK:   Moves extremities without difficulty  Other:    Medical Decision Making  Medically screening exam initiated at 8:09 PM.  Appropriate orders placed.  Sindy Guadeloupe was informed that the remainder of the evaluation will be completed by another provider, this initial triage assessment does not replace that evaluation, and the importance of remaining in the ED until their evaluation is complete.     Fayrene Helper, PA-C 03/19/23 2016

## 2023-03-19 NOTE — ED Provider Notes (Incomplete)
[place] EMERGENCY DEPARTMENT AT [facility] Provider Note   CSN: [id number] Arrival date & time: 03/19/23  8416     History {Add pertinent medical, surgical, social history, OB history to HPI:1} Chief Complaint  Patient presents with  . cellulitis in his spine    Levi [name] is a 77 y.o. male with history of A-fib on Eliquis, lumbar stenosis with neurogenic claudication, COPD, systolic heart failure, hypertension, was told to present to the ER from his primary care provider.  He had a mechanical fall on 03/12/2023 where he landed on his low back.  Since the fall, has been reporting low back pain, fevers to 100.5, increased fatigue.  Reports taking Tylenol for his fevers which helps to bring them down.  He saw his primary care who obtained a CT scan which revealed edema in the inferior gluteal regions, concerning for possible infection as well as a leukocytosis of 22.6.  There is also gram-positive cocci in clusters on his blood cultures from yesterday. He was referred to the ED for further evaluation.  HPI     Home Medications Prior to Admission medications   Medication Sig Start Date End Date Taking? Authorizing Provider  albuterol (VENTOLIN HFA) 108 (90 Base) MCG/ACT inhaler INHALE 2 PUFFS BY MOUTH EVERY 4 HOURS AS NEEDED FOR WHEEZE OR FOR SHORTNESS OF BREATH 01/14/23   Jetty Duhamel D, MD  allopurinol (ZYLOPRIM) 100 MG tablet Take 1 tablet (100 mg total) by mouth in the morning. 11/25/22   Monica Becton, MD  atorvastatin (LIPITOR) 20 MG tablet TAKE 1 TABLET BY MOUTH EVERY DAY 09/17/22   Jake Bathe, MD  cyclobenzaprine (FLEXERIL) 10 MG tablet Take 1 tablet (10 mg total) by mouth at bedtime. 03/12/23   Monica Becton, MD  diclofenac Sodium (VOLTAREN) 1 % GEL Apply 2 g topically 3 (three) times daily as needed (pain.).    [provider]  diltiazem (CARDIZEM CD) 180 MG 24 hr capsule TAKE 1 CAPSULE BY MOUTH EVERY DAY 10/16/22   Jake Bathe, MD   diltiazem (CARDIZEM) 30 MG tablet TAKE 1 TABLET (30 MG TOTAL) BY MOUTH AS NEEDED (HEART RATE OVER 120). 03/10/23   Jake Bathe, MD  doxycycline (VIBRA-TABS) 100 MG tablet Take 1 tablet (100 mg total) by mouth 2 (two) times daily for 7 days. 03/18/23 03/25/23  Monica Becton, MD  DULoxetine (CYMBALTA) 30 MG capsule Take 1 capsule (30 mg total) by mouth daily. 12/24/22   Lomax, Amy, NP  ELIQUIS 5 MG TABS tablet TAKE 1 TABLET BY MOUTH TWICE A DAY 01/28/23   Jake Bathe, MD  HYDROmorphone (DILAUDID) 4 MG tablet Take 1 tablet (4 mg total) by mouth every 6 (six) hours as needed for up to 5 days. 03/18/23 03/23/23  Monica Becton, MD  ipratropium-albuterol (DUONEB) 0.5-2.5 (3) MG/3ML SOLN 1 NEBULE EVERY 6 HOURS AS NEEDED. **J45.3** 03/22/20   Young, Joni Fears D, MD  isosorbide mononitrate (IMDUR) 30 MG 24 hr tablet TAKE 1 TABLET BY MOUTH EVERY DAY 03/10/23   Jake Bathe, MD  metroNIDAZOLE (FLAGYL) 500 MG tablet Take 1 tablet (500 mg total) by mouth 2 (two) times daily for 7 days. 03/18/23 03/25/23  Monica Becton, MD  Nebulizers (COMPRESSOR/NEBULIZER) MISC Use as directed 12/01/17   Waymon Budge, MD  Olodaterol HCl (STRIVERDI RESPIMAT) 2.5 MCG/ACT AERS INHALE 2 PUFFS BY MOUTH INTO THE LUNGS DAILY 03/10/23   Jetty Duhamel D, MD  pantoprazole (PROTONIX) 40 MG tablet  TAKE 1 TABLET (40 MG TOTAL) BY MOUTH TWICE A DAY BEFORE MEALS 11/04/22   Monica Becton, MD  potassium chloride SA (KLOR-CON M20) 20 MEQ tablet Take 1 tablet (20 mEq total) by mouth daily. 09/05/21   Jake Bathe, MD  predniSONE (DELTASONE) 10 MG tablet Take 1 tablet (10 mg total) by mouth in the morning and at bedtime. 10/30/22   Waymon Budge, MD  pregabalin (LYRICA) 75 MG capsule Take 1 capsule (75 mg total) by mouth 3 (three) times daily. 05/16/22   Monica Becton, MD  tamsulosin (FLOMAX) 0.4 MG CAPS capsule Take 0.4 mg by mouth at bedtime.    [provider]      Allergies     Levaquin [levofloxacin]    Review of Systems   Review of Systems  Constitutional:  Positive for fever.  Musculoskeletal:  Positive for back pain.    Physical Exam Updated Vital Signs BP 118/79 (BP Location: Right Arm)   Pulse (!) 102   Temp 98.8 F (37.1 C) (Oral)   Resp (!) 26   Ht 6\' 2"  (1.88 m)   Wt 90.3 kg   SpO2 95%   BMI 25.56 kg/m  Physical Exam Vitals and nursing note reviewed.  Constitutional:      General: He is not in acute distress.    Appearance: He is well-developed.     Comments: Difficulty moving about in bed due to back pain  HENT:     Head: Normocephalic and atraumatic.  Eyes:     Conjunctiva/sclera: Conjunctivae normal.  Cardiovascular:     Rate and Rhythm: Regular rhythm. Tachycardia present.     Heart sounds: No murmur heard. Pulmonary:     Effort: Pulmonary effort is normal. No respiratory distress.     Breath sounds: Normal breath sounds.  Abdominal:     Palpations: Abdomen is soft.     Tenderness: There is no abdominal tenderness.  Musculoskeletal:        General: No swelling.     Cervical back: Neck supple.     Comments: No tenderness palpation of the spinous processes  Skin:    General: Skin is warm and dry.     Capillary Refill: Capillary refill takes less than 2 seconds.     Comments: Area of healing skin of the right lower extremity, no surrounding erythema or edema  Slight edema of the sacral region, no erythema.  Neurological:     Mental Status: He is alert.  Psychiatric:        Mood and Affect: Mood normal.     ED Results / Procedures / Treatments   Labs (all labs ordered are listed, but only abnormal results are displayed) Labs Reviewed  COMPREHENSIVE METABOLIC PANEL - Abnormal; Notable for the following components:      Result Value   Sodium 133 (*)    Potassium 3.4 (*)    CO2 21 (*)    Glucose, Bld 149 (*)    Total Protein 5.9 (*)    Albumin 2.7 (*)    All other components within normal limits  CBC WITH  DIFFERENTIAL/PLATELET - Abnormal; Notable for the following components:   WBC 22.6 (*)    RBC 3.66 (*)    Hemoglobin 11.0 (*)    HCT 33.4 (*)    RDW 16.8 (*)    Neutro Abs 20.2 (*)    Monocytes Absolute 1.1 (*)    Abs Immature Granulocytes 0.46 (*)    All other components  within normal limits  PROTIME-INR - Abnormal; Notable for the following components:   Prothrombin Time 21.1 (*)    INR 1.8 (*)    All other components within normal limits  CULTURE, BLOOD (ROUTINE X 2)  CULTURE, BLOOD (ROUTINE X 2)  URINALYSIS, W/ REFLEX TO CULTURE (INFECTION SUSPECTED)  I-STAT CG4 LACTIC ACID, ED  I-STAT CG4 LACTIC ACID, ED    EKG None  Radiology DG Chest 2 View  Result Date: 03/19/2023 CLINICAL DATA:  Fever of unknown origin. EXAM: CHEST - 2 VIEW COMPARISON:  Radiograph 09/05/2022 FINDINGS: Normal heart size with stable mediastinal contours. Mild chronic elevation of left hemidiaphragm. No focal airspace disease. No pulmonary edema, pleural effusion or pneumothorax. Blunting of left costophrenic angle is chronic. There are remote right anterior rib fractures with callus. Remote left rib fractures are faintly visualized. Left shoulder arthroplasty. IMPRESSION: 1. No acute chest findings. 2. Chronic elevation of left hemidiaphragm. Electronically Signed   By: Narda Rutherford M.D.   On: 03/19/2023 19:23   CT PELVIS W CONTRAST  Result Date: 03/19/2023 CLINICAL DATA:  Soft tissue infection suspected, pelvis, xray done Fevers, erythema and fullness posterior sacral, evaluate for abscess/collection. EXAM: CT PELVIS WITH CONTRAST TECHNIQUE: Multidetector CT imaging of the pelvis was performed using the standard protocol following the bolus administration of intravenous contrast. RADIATION DOSE REDUCTION: This exam was performed according to the departmental dose-optimization program which includes automated exposure control, adjustment of the mA and/or kV according to patient size and/or use of iterative  reconstruction technique. CONTRAST:  OMNIPAQUE IOHEXOL 300 MG/ML  SOLN COMPARISON:  Recent radiographs.  Abdominopelvic CT 09/25/2021 FINDINGS: Urinary Tract: Partially distended urinary bladder, normal for degree of distension. Bowel: Left colonic diverticulosis. Large volume of stool in the included colon. There is no obstruction or inflammation of pelvic bowel loops. Mild rectal wall thickening but no perirectal collection. Vascular/Lymphatic: Aortic and branch atherosclerosis. No aneurysm. No suspicious pelvic adenopathy. Reproductive: Prostatic calcifications, prostate is normal in size. No evidence of scrotal inflammation or soft tissue gas. Other: Mild subcutaneous edema of the inferior gluteal regions, left greater than right, for example series 304, image 117. There is also mild edema along the gluteal cleft series 301, image 87. No drainable collection. No soft tissue gas. No intrapelvic fluid collection. Chronic edema in the subcutaneous tissues posteriorly. Small bilateral fat containing inguinal hernias. Musculoskeletal: Bilateral hip arthroplasties. No periprosthetic lucency or fracture. There is heterotopic calcification posteriorly on the left and anteriorly on the right. Right superior and inferior pubic rami fractures are subacute or chronic with incomplete callus formation. Despite metal artifact reduction technique there is some streak artifact obscuring detailed assessment. Lumbosacral fusion hardware. The fracture of the right S1 screw was better appreciated on recent radiograph. No evidence of sacral fracture. No erosive change. Degenerative change of both sacroiliac joints. Fatty atrophy of the left psoas muscle. IMPRESSION: 1. Mild subcutaneous edema of the inferior gluteal regions, left greater than right, and along the gluteal cleft. This may be dependent edema or infection. No drainable collection. No soft tissue gas. 2. Right superior and inferior pubic rami fractures are subacute  or chronic with incomplete callus formation. 3. Bilateral hip arthroplasties without periprosthetic lucency or fracture. 4. Lumbosacral fusion hardware. The fracture of the right S1 screw was better appreciated on recent radiograph. 5. No evidence of musculoskeletal infection. 6. Large volume of stool in the included colon. Mild rectal wall thickening may represent proctitis. 7. Left colonic diverticulosis. Aortic Atherosclerosis (ICD10-I70.0). Electronically Signed  By: Narda Rutherford M.D.   On: 03/19/2023 19:22    Procedures .Critical Care  Performed by: Arabella Merles, PA-C Authorized by: Arabella Merles, PA-C   Critical care provider statement:    Critical care time (minutes):  30   Critical care was necessary to treat or prevent imminent or life-threatening deterioration of the following conditions:  Sepsis   Critical care was time spent personally by me on the following activities:  Development of treatment plan with patient or surrogate, discussions with consultants, obtaining history from patient or surrogate, examination of patient, ordering and performing treatments and interventions, ordering and review of laboratory studies and review of old charts   {Document cardiac monitor, telemetry assessment procedure when appropriate:1}  Medications Ordered in ED Medications - No data to display  ED Course/ Medical Decision Making/ A&P Clinical Course as of 03/19/23 2235  Wed Mar 19, 2023  5525 77 year old male complaining of sacral pain.  Had outpatient workup including a CT today which shows inflammatory changes although no discrete abscess.  He also had positive blood cultures.  Sent here for further evaluation.  Does have elevated white count and tachycardia at least meeting SIRS criteria with possible bacteremia.  Will cover with antibiotics and discussed with medicine regarding admission. [MB]    Clinical Course User Index [MB] Terrilee Files, MD   {   Click here for  ABCD2, HEART and other calculatorsREFRESH Note before signing :1}                              Medical Decision Making Risk Prescription drug management. Decision regarding hospitalization.   Differential diagnosis includes but is not limited to cellulitis, sepsis, low back pain secondary to fall, fracture  ED Course:  Patient overall well-appearing, no acute distress.  He initially was tachycardic to 102 upon arrival, slightly tachypneic with a respiratory rate at 26.  He was oxygenating well on room air, O2 sat 95%.  There is concern for sepsis due to a leukocytosis of 23 taken yesterday, and leukocytosis of 22.6 here today in the ED.  Upon review of labs from yesterday, there was a positive blood culture growing gram-positive cocci from yesterday.  CT pelvis obtained by PCP yesterday also shows some subcutaneous edema of the inferior gluteal regions, concerning for possible dependent edema or infection.  Given his vitals, leukocytosis, positive blood culture, code sepsis was called.  Patient was started on vancomycin, Flagyl, cefepime for possible cellulitic source of infection.  He was started on LR boluses. Upon re-evaluation, patient ***.    Impression: ***  Disposition:  {AF ED Dispo:29713} Return precautions given.  Lab Tests: I Ordered, and personally interpreted labs.  The pertinent results include:   CBC with leukocytosis of 22.6 Urinalysis without any sign of UTI CMP with slight hyponatremia at 133, hypokalemia at 3.4 Lactic acid at 1.4  Imaging Studies ordered: I ordered imaging studies including CT pelvis I independently visualized the imaging with scope of interpretation limited to determining acute life threatening conditions related to emergency care. Imaging showed mild subcutaneous edema along the inferior gluteal regions, concerning for dependent edema or infection. I agree with the radiologist interpretation   Consultations Obtained: I requested consultation  with the hospitalist Dr Allena Katz,  and discussed lab and imaging findings as well as pertinent plan - they recommend: admission  External records from outside source obtained and reviewed including  Blood cultures from yesterday showing gram-positive cocci  in clusters CBC from yesterday showing leukocytosis of 23.3   Co morbidities that complicate the patient evaluation  A-fib on Eliquis, lumbar stenosis with neurogenic claudication, COPD, systolic heart failure, hypertension        {Document critical care time when appropriate:1} {Document review of labs and clinical decision tools ie heart score, Chads2Vasc2 etc:1}  {Document your independent review of radiology images, and any outside records:1} {Document your discussion with family members, caretakers, and with consultants:1} {Document social determinants of health affecting pt's care:1} {Document your decision making why or why not admission, treatments were needed:1} Final Clinical Impression(s) / ED Diagnoses Final diagnoses:  None    Rx / DC Orders ED Discharge Orders     None

## 2023-03-20 ENCOUNTER — Inpatient Hospital Stay (HOSPITAL_COMMUNITY): Payer: Medicare Other

## 2023-03-20 DIAGNOSIS — R7881 Bacteremia: Secondary | ICD-10-CM | POA: Diagnosis not present

## 2023-03-20 DIAGNOSIS — I872 Venous insufficiency (chronic) (peripheral): Secondary | ICD-10-CM | POA: Diagnosis present

## 2023-03-20 LAB — BASIC METABOLIC PANEL
Anion gap: 7 (ref 5–15)
BUN: 21 mg/dL (ref 8–23)
CO2: 25 mmol/L (ref 22–32)
Calcium: 8.5 mg/dL — ABNORMAL LOW (ref 8.9–10.3)
Chloride: 104 mmol/L (ref 98–111)
Creatinine, Ser: 0.91 mg/dL (ref 0.61–1.24)
GFR, Estimated: >60 mL/min (ref >60)
Glucose, Bld: 107 mg/dL — ABNORMAL HIGH (ref 70–99)
Potassium: 3.1 mmol/L — ABNORMAL LOW (ref 3.5–5.1)
Sodium: 136 mmol/L (ref 135–145)

## 2023-03-20 LAB — CBC
HCT: 29.2 % — ABNORMAL LOW (ref 39.0–52.0)
Hemoglobin: 9.5 g/dL — ABNORMAL LOW (ref 13.0–17.0)
MCH: 29.7 pg (ref 26.0–34.0)
MCHC: 32.5 g/dL (ref 30.0–36.0)
MCV: 91.3 fL (ref 80.0–100.0)
Platelets: 146 10*3/uL — ABNORMAL LOW (ref 150–400)
RBC: 3.2 MIL/uL — ABNORMAL LOW (ref 4.22–5.81)
RDW: 16.7 % — ABNORMAL HIGH (ref 11.5–15.5)
WBC: 14.9 10*3/uL — ABNORMAL HIGH (ref 4.0–10.5)
nRBC: 0 % (ref 0.0–0.2)

## 2023-03-20 LAB — ECHOCARDIOGRAM COMPLETE
AR max vel: 1.37 cm2
AV Area VTI: 1.42 cm2
AV Area mean vel: 1.34 cm2
AV Mean grad: 12 mm[Hg]
AV Peak grad: 20.9 mm[Hg]
Ao pk vel: 2.28 m/s
Area-P 1/2: 3.65 cm2
Height: 74 in
MV M vel: 3.43 m/s
MV Peak grad: 47.1 mm[Hg]
P 1/2 time: 332 ms
S' Lateral: 4.2 cm
Weight: 3185.21 [oz_av]

## 2023-03-20 MED ORDER — TAMSULOSIN HCL 0.4 MG PO CAPS
0.4000 mg | ORAL_CAPSULE | Freq: Every day | ORAL | Status: DC
  Administered 2023-03-20 – 2023-03-23 (×4): 0.4 mg via ORAL
  Filled 2023-03-20 (×4): qty 1

## 2023-03-20 MED ORDER — DILTIAZEM HCL 30 MG PO TABS: 30.0000 mg | ORAL_TABLET | ORAL | Status: DC | PRN

## 2023-03-20 MED ORDER — ACETAMINOPHEN 650 MG RE SUPP: 650.0000 mg | Freq: Four times a day (QID) | RECTAL | Status: DC | PRN

## 2023-03-20 MED ORDER — SENNOSIDES-DOCUSATE SODIUM 8.6-50 MG PO TABS
1.0000 | ORAL_TABLET | Freq: Every evening | ORAL | Status: DC | PRN
Start: 2023-03-20 — End: 2023-03-24
  Administered 2023-03-20: 1 via ORAL
  Filled 2023-03-20: qty 1

## 2023-03-20 MED ORDER — CYCLOBENZAPRINE HCL 10 MG PO TABS
10.0000 mg | ORAL_TABLET | Freq: Every day | ORAL | Status: DC
  Administered 2023-03-20 – 2023-03-23 (×4): 10 mg via ORAL
  Filled 2023-03-20 (×4): qty 1

## 2023-03-20 MED ORDER — ONDANSETRON HCL 4 MG PO TABS: 4.0000 mg | ORAL_TABLET | Freq: Four times a day (QID) | ORAL | Status: DC | PRN

## 2023-03-20 MED ORDER — ALBUTEROL SULFATE HFA 108 (90 BASE) MCG/ACT IN AERS: 2.0000 | INHALATION_SPRAY | RESPIRATORY_TRACT | Status: DC | PRN

## 2023-03-20 MED ORDER — DILTIAZEM HCL 30 MG PO TABS: 30.0000 mg | ORAL_TABLET | Freq: Four times a day (QID) | ORAL | Status: DC | PRN

## 2023-03-20 MED ORDER — POTASSIUM CHLORIDE 20 MEQ PO PACK
40.0000 meq | PACK | Freq: Once | ORAL | Status: AC
  Administered 2023-03-20: 40 meq via ORAL
  Filled 2023-03-20: qty 2

## 2023-03-20 MED ORDER — VANCOMYCIN HCL IN DEXTROSE 1-5 GM/200ML-% IV SOLN
1000.0000 mg | Freq: Two times a day (BID) | INTRAVENOUS | Status: DC
  Administered 2023-03-20 – 2023-03-21 (×2): 1000 mg via INTRAVENOUS
  Filled 2023-03-20 (×2): qty 200

## 2023-03-20 MED ORDER — PREGABALIN 75 MG PO CAPS
75.0000 mg | ORAL_CAPSULE | Freq: Two times a day (BID) | ORAL | Status: DC
  Administered 2023-03-20 – 2023-03-24 (×9): 75 mg via ORAL
  Filled 2023-03-20 (×9): qty 1

## 2023-03-20 MED ORDER — ATORVASTATIN CALCIUM 10 MG PO TABS
20.0000 mg | ORAL_TABLET | Freq: Every day | ORAL | Status: DC
  Administered 2023-03-20 – 2023-03-24 (×5): 20 mg via ORAL
  Filled 2023-03-20 (×5): qty 2

## 2023-03-20 MED ORDER — ISOSORBIDE MONONITRATE ER 30 MG PO TB24
30.0000 mg | ORAL_TABLET | Freq: Every day | ORAL | Status: DC
  Administered 2023-03-20 – 2023-03-24 (×5): 30 mg via ORAL
  Filled 2023-03-20 (×5): qty 1

## 2023-03-20 MED ORDER — ONDANSETRON HCL 4 MG/2ML IJ SOLN: 4.0000 mg | Freq: Four times a day (QID) | INTRAMUSCULAR | Status: DC | PRN

## 2023-03-20 MED ORDER — PANTOPRAZOLE SODIUM 40 MG PO TBEC
40.0000 mg | DELAYED_RELEASE_TABLET | Freq: Two times a day (BID) | ORAL | Status: DC
  Administered 2023-03-20 – 2023-03-24 (×9): 40 mg via ORAL
  Filled 2023-03-20 (×9): qty 1

## 2023-03-20 MED ORDER — APIXABAN 5 MG PO TABS
5.0000 mg | ORAL_TABLET | Freq: Two times a day (BID) | ORAL | Status: DC
  Administered 2023-03-20 – 2023-03-24 (×9): 5 mg via ORAL
  Filled 2023-03-20 (×9): qty 1

## 2023-03-20 MED ORDER — MEDIHONEY WOUND/BURN DRESSING EX PSTE
1.0000 | PASTE | Freq: Every day | CUTANEOUS | Status: DC
  Administered 2023-03-21 – 2023-03-24 (×4): 1 via TOPICAL
  Filled 2023-03-20 (×3): qty 44

## 2023-03-20 MED ORDER — PREDNISONE 10 MG PO TABS
10.0000 mg | ORAL_TABLET | Freq: Two times a day (BID) | ORAL | Status: DC
  Administered 2023-03-20 – 2023-03-24 (×9): 10 mg via ORAL
  Filled 2023-03-20 (×9): qty 1

## 2023-03-20 MED ORDER — ALLOPURINOL 100 MG PO TABS
100.0000 mg | ORAL_TABLET | Freq: Every morning | ORAL | Status: DC
  Administered 2023-03-21 – 2023-03-24 (×4): 100 mg via ORAL
  Filled 2023-03-20 (×4): qty 1

## 2023-03-20 MED ORDER — SODIUM CHLORIDE 0.9% FLUSH
3.0000 mL | Freq: Two times a day (BID) | INTRAVENOUS | Status: DC
  Administered 2023-03-20 – 2023-03-24 (×10): 3 mL via INTRAVENOUS

## 2023-03-20 MED ORDER — HYDROCODONE-ACETAMINOPHEN 10-325 MG PO TABS
1.0000 | ORAL_TABLET | Freq: Four times a day (QID) | ORAL | Status: DC | PRN
  Administered 2023-03-20 – 2023-03-23 (×5): 1 via ORAL
  Filled 2023-03-20 (×6): qty 1

## 2023-03-20 MED ORDER — DILTIAZEM HCL ER COATED BEADS 180 MG PO CP24
180.0000 mg | ORAL_CAPSULE | Freq: Every day | ORAL | Status: DC
  Administered 2023-03-20 – 2023-03-24 (×5): 180 mg via ORAL
  Filled 2023-03-20 (×5): qty 1

## 2023-03-20 MED ORDER — VANCOMYCIN HCL 2000 MG/400ML IV SOLN: 2000.0000 mg | INTRAVENOUS | Status: DC

## 2023-03-20 MED ORDER — ACETAMINOPHEN 325 MG PO TABS: 650.0000 mg | ORAL_TABLET | Freq: Four times a day (QID) | ORAL | Status: DC | PRN

## 2023-03-20 MED ORDER — ARFORMOTEROL TARTRATE 15 MCG/2ML IN NEBU
15.0000 ug | INHALATION_SOLUTION | Freq: Two times a day (BID) | RESPIRATORY_TRACT | Status: DC
  Administered 2023-03-20 – 2023-03-24 (×8): 15 ug via RESPIRATORY_TRACT
  Filled 2023-03-20 (×8): qty 2

## 2023-03-20 MED ORDER — MORPHINE SULFATE (PF) 2 MG/ML IV SOLN
2.0000 mg | INTRAVENOUS | Status: DC | PRN
Start: 2023-03-20 — End: 2023-03-24
  Administered 2023-03-22 – 2023-03-23 (×2): 2 mg via INTRAVENOUS
  Filled 2023-03-20 (×2): qty 1

## 2023-03-20 MED ORDER — DULOXETINE HCL 30 MG PO CPEP
30.0000 mg | ORAL_CAPSULE | Freq: Every day | ORAL | Status: DC
  Administered 2023-03-20 – 2023-03-24 (×5): 30 mg via ORAL
  Filled 2023-03-20 (×5): qty 1

## 2023-03-20 MED ORDER — IPRATROPIUM-ALBUTEROL 0.5-2.5 (3) MG/3ML IN SOLN: 3.0000 mL | Freq: Four times a day (QID) | RESPIRATORY_TRACT | Status: DC | PRN

## 2023-03-20 NOTE — Progress Notes (Signed)
   [facility] has been requested to perform a transesophageal echocardiogram on Levi [name] for bacteremia.  After careful review of history and examination, the risks and benefits of transesophageal echocardiogram have been explained including risks of esophageal damage, perforation (1:10,000 risk), bleeding, pharyngeal hematoma as well as other potential complications associated with conscious sedation including aspiration, arrhythmia, respiratory failure and death. Alternatives to treatment were discussed, questions were answered. Patient is willing to proceed.   Procedure is scheduled for 03/21/2023 at 14:15 with Dr. Izora Ribas. Will make NPO at midnight and place pre-procedural orders.   Of note, patient has a history of odynophagia but denies any history of dysphagia. He has had multiple endoscopies before (last one in 12/2020). No history of radiation to chest or neck. Hemoglobin dropped from 11.0 yesterday to 9.5 today. He already has repeat labs scheduled for tomorrow morning.  Corrin Parker, PA-C 03/20/2023 4:14 PM

## 2023-03-20 NOTE — Progress Notes (Signed)
Progress Note   Patient: Levi [name] ZOX:[id number] DOB: Apr 23, 1946 DOA: 03/19/2023     1 DOS: the patient was seen and examined on 03/20/2023   Brief hospital course: 77 y.o. with h/o PAF on Eliquis, COPD on chronic prednisone 20 mg daily, HTN, chronic lower extremity edema, and hx of lumbosacral fusion who is admitted with bacteremia due to [facility] in clusters that was identified on outpatient work-up.  He has long-standing venous insufficiency without recent wound care follow up (last in 08/2021) and noncompliance with compression.  Wound care consulted and recommends Medihoney with wraps.  Assessment and Plan:  Bacteremia due to [facility] in clusters -Growing on outpatient blood culture collected 10/22 - C&S pending -Has been on doxy and flagyl at home since 10/22 -He reports periodic fevers to 100.5 for several months -CT pelvis w/ contrast did not show drainable collection or soft tissue gas at gluteal region -He has significant hardware and hardware infection is considered but no frank pain other than R hip (with negative pelvic imaging other than R pubic rami fractures) -Continue IV vancomycin -Follow blood culture collected 10/22, repeat blood cultures 10/23 -ID consulted -Echo today, TEE 10/25 (scheduled for 1415)  Venous insufficiency wounds -Wound care consulted -Very possible source of infection as both legs are erythematous, edematous, and with dry necrotic tissue per wound care -Wound to R lower leg needs medihoney    Paroxysmal atrial fibrillation -Stable, in sinus rhythm on admission   -Continue diltiazem and Eliquis   COPD -Stable on admission -Continue olodaterol, DuoNebs as needed, chronic prednisone 10 mg BID -He is immunocompromised due to daily prednisone and so at increased risk for opportunistic infections   Hyperlipidemia -Continue atorvastatin   Chronic neuropathy -Continue Cymbalta and Lyrica   BPH -Continue Flomax  CAD -Continue Imdur -No report of  chest pain    Consultants: ID Wound Care Cardiology  Procedures: Echocardiogram 10/24 TEE 10/25  Antibiotics: Vancomycin 10/23- Metronidazole x 1 Cefepime x 1   30 Day Unplanned Readmission Risk Score    Flowsheet Row ED to Hosp-Admission (Current) from 03/19/2023 in [place] [facility]  30 Day Unplanned Readmission Risk Score (%) 16.22 Filed at 03/20/2023 0400       This score is the patient's risk of an unplanned readmission within 30 days of being discharged (0 -100%). The score is based on dignosis, age, lab data, medications, orders, and past utilization.   Low:  0-14.9   Medium: 15-21.9   High: 22-29.9   Extreme: 30 and above           Subjective: Lots of questions, but no specific complaints of pain, fever, new issues.   Objective: Vitals:   03/20/23 0735 03/20/23 0850  BP: 133/78   Pulse: 87   Resp:    Temp:    SpO2: 96% 96%    Intake/Output Summary (Last 24 hours) at 03/20/2023 1439 Last data filed at 03/20/2023 8119 Gross per 24 hour  Intake 3723.29 ml  Output 390 ml  Net 3333.29 ml   Filed Weights   03/19/23 2003  Weight: 90.3 kg    Exam:  General:  Appears calm and comfortable and is in NAD Eyes:  PERRL, EOMI, normal lids, iris ENT:  grossly normal hearing, lips & tongue, mmm Neck:  no LAD, masses or thyromegaly Cardiovascular:  RRR, with 3/6 systolic murmur. 1-2+ LE edema.  Respiratory:   CTA bilaterally with no wheezes/rales/rhonchi.  Normal respiratory effort. Abdomen:  soft, NT, ND Skin:  B LE venous insufficiency with skin changes, erythema, edema; R lateral lower leg is covered, has a wound from running into someone with a walker Musculoskeletal:  grossly normal tone BUE/BLE, good ROM, no bony abnormality; Charcot foot on the right Psychiatric:  grossly normal mood and affect, speech fluent and appropriate, AOx3 Neurologic:  CN 2-12 grossly intact, moves all extremities in coordinated fashion  Data Reviewed: I  have reviewed the patient's lab results since admission.  Pertinent labs for today include:   BMP pending WBC 14.9, down from 22.6 Hgb 9.5, down from 11 INR 1.8 Blood culture from 10/22 with GPC in clusters in both aerobic and anaerobic bottles  Repeat blood cultures x 2 NTD from 10/23   Family Communication: Wife was present throughout evaluation  Disposition: Status is: Inpatient Remains inpatient appropriate because: ongoing evaluation and management     Time spent: 50 minutes  Unresulted Labs (From admission, onward)     Start     Ordered   03/21/23 0500  CBC with Differential/Platelet  Tomorrow morning,   R        03/20/23 1432   03/21/23 0500  Basic metabolic panel  Tomorrow morning,   R        03/20/23 1432             Author: Jonah Blue, MD 03/20/2023 2:39 PM  For on call review [email or web address].

## 2023-03-20 NOTE — Progress Notes (Signed)
Pharmacy Antibiotic Note  Levi [name] is a 77 y.o. male admitted on 03/19/2023 with bacteremia. Blood cultures GPC positive from outpatient office visit and instructions to present to ED. Repeat blood culture drawn in ED. Pharmacy has been consulted for vancomycin dosing.  To optimize PK/PD parameters and target a goal trough > 10 will adjust regimen.   Plan: Vancomycin 1000 mg IV every 12 hours (eAUC 427, Scr 0.91) F/u micro data, renal function and vancomycin levels as indicated   Height: 6\' 2"  (188 cm) Weight: 90.3 kg (199 lb 1.2 oz) IBW/kg (Calculated) : 82.2  Temp (24hrs), Avg:98.3 F (36.8 C), Min:97.7 F (36.5 C), Max:98.8 F (37.1 C)  Recent Labs  Lab 03/18/23 1411 03/19/23 2015 03/19/23 2024 03/20/23 0640  WBC 23.3* 22.6*  --  14.9*  CREATININE 1.04 1.09  --  0.91  LATICACIDVEN  --   --  1.4  --     Estimated Creatinine Clearance: 81.5 mL/min (by C-G formula based on SCr of 0.91 mg/dL).    Allergies  Allergen Reactions   Levaquin [Levofloxacin] Itching    Antimicrobials this admission: Cefepime 10/23 x 1 Flagyl 10/23 x 1 Vancomycin 10/23 >   Microbiology results: 10/22 BCx: gram positive cocci in clusters  10/23 Bcx: ng   Thank you for allowing pharmacy to be a part of this patient's care.  Griffin Dakin 03/20/2023 11:00 AM

## 2023-03-20 NOTE — Progress Notes (Signed)
Echocardiogram 2D Echocardiogram has been performed.  Lucendia Herrlich 03/20/2023, 5:29 PM

## 2023-03-20 NOTE — H&P (Incomplete)
History and Physical    Levi [name] GMW:[id number] DOB: 1945-09-26 DOA: 03/19/2023  PCP: Monica Becton, MD  Patient coming from: Home  I have personally briefly reviewed patient's old medical records in [facility]  Chief Complaint: Bacteremia  HPI: Levi [name] is a 77 y.o. male with medical history significant for PAF on Eliquis, COPD on chronic prednisone 20 mg daily, HTN, chronic lower extremity edema, hx of lumbosacral fusion who presented to the ED for management of bacteremia.  Patient has been following with his PCP for evaluation of intermittent fevers ongoing for about 6 months.  Lab work obtained yesterday (03/18/2023) showed leukocytosis with WBC 23,000, ESR 51, CRP 233.  UA was negative for UTI.  Single blood culture obtained 03/18/23 is growing gram-positive cocci in clusters.  2 view chest x-ray was negative for focal consolidation, edema, effusion.  CT pelvis with contrast performed today showed mild subcutaneous edema of the inferior gluteal regions, left greater than right, and along the gluteal cleft which may represent dependent edema or infection.  No drainable collection, no soft tissue gas.  Right superior and inferior pubic rami fractures are subacute or chronic with incomplete callus formation.  Bilateral hip arthroplasties without periprosthetic lucency or fracture.  Lumbosacral fusion hardware seen.  Fracture of right S1 screw was better appreciated on the recent radiograph.  No evidence of musculoskeletal infection.  Patient was notified of the results of the blood culture and was sent to the ED for further evaluation.  Patient states he had a fall onto his buttocks on 10/16 while walking his dog.  He has been having some ongoing pain since then.  Also reports an injury to his right shin area a while ago which has had a persistent wound.  They had seen some discharge at 1 point but areas mostly scabbed over now.  Patient states that he has been  having intermittent fevers for the last 6 months, highest temp up to 100.5 F when checked at home.  He has had some increased urinary frequency without dysuria recently.  Also has reported some right groin pain.  Denies any significant chest pain, dyspnea, cough, emesis, diarrhea.  ED Course  Labs/Imaging on admission: I have personally reviewed following labs and imaging studies.  Initial vitals showed BP 118/79, pulse 102, RR 26, temp 98.8 F, SpO2 95% on room air.  Labs show WBC 22.6, hemoglobin 11.0, platelets 177,000, sodium 133, potassium 3.4, bicarb 21, BUN 23, creatinine 1.09, serum glucose 149, LFTs within normal limits, lactic acid 1.4.  Urinalysis showed negative nitrites, trace leukocytes, 11-20 RBCs, 0-5 WBCs, no bacteria on microscopy.  Blood cultures in process.  Patient was given 3 L LR, IV vancomycin, cefepime, Flagyl.  The hospitalist service was consulted to admit for further evaluation and management.  Review of Systems: All systems reviewed and are negative except as documented in history of present illness above.   Past Medical History:  Diagnosis Date  . Agent orange exposure 1970's   takes Imdur,Diltiazem, and Atacand daily  . ALLERGIC RHINITIS   . Allergy    seasonal allergies  . Asthma    uses inhaler and nebulizers  . Bruises easily    d/t prednisone daily  . Cataract   . CHF (congestive heart failure) (HCC)    dx-on meds to treat  . Chronic bronchitis (HCC)    "used to get it q yr; last time ?2008" (04/28/2012)  . Clotting disorder (HCC) 2019   On blood thinner  when I bleed it is for a consideral amount of time. This is tegardless of size of wound.  [name] COPD (chronic obstructive pulmonary disease) (HCC)    agent orange exposure  . Degenerative disk disease    "everywhere" (04/28/2012)  . Diverticulitis   . Dysrhythmia    afib  . Elevated uric acid in blood    takes Allopurinol daily  . Emphysema of lung (HCC)   . Enlarged prostate    but not on  any meds  . GERD (gastroesophageal reflux disease)    takes Protonix daily  . H/O hiatal hernia   . Headache   . History of pneumonia    a. 2010  . Hyperlipidemia    takes Lipitor daily; pt. states he takes a preventive  . Hypothyroidism   . Joint pain   . Joint swelling   . Nausea and vomiting 11/12/2022  . Neuromuscular disorder (HCC)    bilat legs, and bilat arms  . Osteoporosis 2012  . PAF (paroxysmal atrial fibrillation) (HCC)    a. Dx 04/2012, CHA2DS2VASc = 1 (age);  b. 04/2012 Echo: EF 40-50%, mild MR.  . Personal history of colonic adenomas 02/09/2013  . Pneumonia   . Shortness of breath dyspnea   . Thyroid disease    no taking meds-caused dizziness    Past Surgical History:  Procedure Laterality Date  . 24 HOUR PH STUDY N/A 05/30/2021   Procedure: 24 HOUR PH STUDY;  Surgeon: Shellia Cleverly, DO;  Location: [facility];  Service: Gastroenterology;  Laterality: N/A;  . ANTERIOR CERVICAL DECOMP/DISCECTOMY FUSION N/A 07/21/2012   Procedure: ANTERIOR CERVICAL DECOMPRESSION/DISCECTOMY FUSION 2 LEVELS;  Surgeon: Barnett Abu, MD;  Location: [facility];  Service: Neurosurgery;  Laterality: N/A;  C4-5 C5-6 Anterior cervical decompression/diskectomy/fusion  . ANTERIOR LAT LUMBAR FUSION N/A 04/07/2017   Procedure: Thoracic twelve-Lumbar one Anterolateral decompression/fusion;  Surgeon: Barnett Abu, MD;  Location: [facility] OR;  Service: Neurosurgery;  Laterality: N/A;  . ARTHRODESIS FOOT WITH WEIL OSTEOTOMY Left 11/07/2022   Procedure: LEFT THIRD METATARSOPHALANGEAL CAPSULOTOMY, WEIL OSTEOTOMY;  Surgeon: Terance Hart, MD;  Location: [facility] OR;  Service: Orthopedics;  Laterality: Left;  LENGTH OF SURGERY: 60 MINUTES  . BACK SURGERY     x 3  . CARDIAC CATHETERIZATION  11/2008   Dr. Anne Fu - 20% calcified non flow limiting left main, 50% EF apical hypokinesis  . CERVICAL DISC ARTHROPLASTY N/A 04/07/2017   Procedure: Cervical six-seven Disc arthroplasty;  Surgeon: Barnett Abu,  MD;  Location: [facility] OR;  Service: Neurosurgery;  Laterality: N/A;  . CHEST TUBE INSERTION Left 04/11/2017   Procedure: CHEST TUBE INSERTION;  Surgeon: Kerin Perna, MD;  Location: [facility] OR;  Service: Thoracic;  Laterality: Left;  . CHOLECYSTECTOMY    . COLONOSCOPY  2016   CG-MAC-miralax(good)servere TICS/TA x 2  . ESOPHAGEAL MANOMETRY N/A 05/30/2021   Procedure: ESOPHAGEAL MANOMETRY (EM);  Surgeon: Shellia Cleverly, DO;  Location: [facility];  Service: Gastroenterology;  Laterality: N/A;  ph impedence  . ESOPHAGOGASTRODUODENOSCOPY    . EYE SURGERY Bilateral 2011   steroidal encapsulation" (04/28/2012)  . FOOT SURGERY Right    x 2  . JOINT REPLACEMENT     Many surgeries including 5 joit replacements  . KNEE ARTHROSCOPY Bilateral   . LATERAL / POSTERIOR COMBINED FUSION LUMBAR SPINE  2012  . LEFT ATRIAL APPENDAGE OCCLUSION N/A 02/22/2021   Procedure: LEFT ATRIAL APPENDAGE OCCLUSION;  Surgeon: Lanier Prude, MD;  [facility]: [facility];  Service:  Cardiovascular;  Laterality: N/A;  . POLYPECTOMY  2016   severe TICS/TA x 2  . POSTERIOR CERVICAL LAMINECTOMY WITH MET- RX Right 06/11/2021   Procedure: Right Cervical six-seven  Laminectomy and foraminotomy with metrex;  Surgeon: Barnett Abu, MD;  Location: [facility] OR;  Service: Neurosurgery;  Laterality: Right;  . REVERSE SHOULDER ARTHROPLASTY  04/28/2012   Procedure: REVERSE SHOULDER ARTHROPLASTY;  Surgeon: Mable Paris, MD;  Location: [facility] OR;  Service: Orthopedics;  Laterality: Left;  Left shouder reverse total shoulder arthroplasty  . SHOULDER SURGERY    . SPINE SURGERY     I have had 7 spinal surgeries  . TEE WITHOUT CARDIOVERSION N/A 02/22/2021   Procedure: TRANSESOPHAGEAL ECHOCARDIOGRAM (TEE);  Surgeon: Lanier Prude, MD;  Location: [facility] INVASIVE CV LAB;  Service: Cardiovascular;  Laterality: N/A;  . TENDON REPAIR Right 08/07/2020   Procedure: RIGHT HAND LIGAMENT RECONSTRUCTION AND TENDON INTERPOSITION;  Surgeon:  Jodi Geralds, MD;  Location: [facility];  Service: Orthopedics;  Laterality: Right;  . TONSILLECTOMY AND ADENOIDECTOMY  1954  . TOTAL HIP ARTHROPLASTY Left 2011   "left" (04/28/2012)  . TOTAL HIP ARTHROPLASTY Right 07/31/2015   Procedure: TOTAL HIP ARTHROPLASTY ANTERIOR APPROACH;  Surgeon: Jodi Geralds, MD;  Location: [facility] OR;  Service: Orthopedics;  Laterality: Right;  . TOTAL KNEE ARTHROPLASTY  01/10/2012   Procedure: TOTAL KNEE ARTHROPLASTY;  Surgeon: Harvie Junior, MD;  Location: [facility] OR;  Service: Orthopedics;;  left total knee arthroplasty  . TOTAL KNEE ARTHROPLASTY Right 06/17/2014   Procedure: TOTAL KNEE ARTHROPLASTY;  Surgeon: Harvie Junior, MD;  Location: [facility] OR;  Service: Orthopedics;  Laterality: Right;  . toupet fundolplication  11/01/2021    Social History:  reports that he has never smoked. He has never used smokeless tobacco. He reports that he does not drink alcohol and does not use drugs.  Allergies  Allergen Reactions  . Levaquin [Levofloxacin] Itching    Family History  Problem Relation Age of Onset  . Diabetes Mother   . Heart disease Father        Died 38, MI  . Cancer Father   . Hyperlipidemia Father   . Stroke Father   . Prostate cancer Brother   . Colon polyps Brother   . Colon polyps Brother   . Asthma Brother   . COPD Brother   . Colon cancer Brother        Dx age 81  . Cancer Brother   . Colon cancer Other   . Esophageal cancer Neg Hx   . Rectal cancer Neg Hx   . Stomach cancer Neg Hx   . Pancreatic cancer Neg Hx   . Kidney disease Neg Hx   . Liver disease Neg Hx   . Neuropathy Neg Hx      Prior to Admission medications   Medication Sig Start Date End Date Taking? Authorizing Provider  albuterol (VENTOLIN HFA) 108 (90 Base) MCG/ACT inhaler INHALE 2 PUFFS BY MOUTH EVERY 4 HOURS AS NEEDED FOR WHEEZE OR FOR SHORTNESS OF BREATH 01/14/23   Jetty Duhamel D, MD  allopurinol (ZYLOPRIM) 100 MG tablet Take 1 tablet (100 mg total) by mouth  in the morning. 11/25/22   Monica Becton, MD  atorvastatin (LIPITOR) 20 MG tablet TAKE 1 TABLET BY MOUTH EVERY DAY 09/17/22   Jake Bathe, MD  cyclobenzaprine (FLEXERIL) 10 MG tablet Take 1 tablet (10 mg total) by mouth at bedtime. 03/12/23   Monica Becton, MD  diclofenac Sodium (VOLTAREN)  1 % GEL Apply 2 g topically 3 (three) times daily as needed (pain.).    [provider]  diltiazem (CARDIZEM CD) 180 MG 24 hr capsule TAKE 1 CAPSULE BY MOUTH EVERY DAY 10/16/22   Jake Bathe, MD  diltiazem (CARDIZEM) 30 MG tablet TAKE 1 TABLET (30 MG TOTAL) BY MOUTH AS NEEDED (HEART RATE OVER 120). 03/10/23   Jake Bathe, MD  doxycycline (VIBRA-TABS) 100 MG tablet Take 1 tablet (100 mg total) by mouth 2 (two) times daily for 7 days. 03/18/23 03/25/23  Monica Becton, MD  DULoxetine (CYMBALTA) 30 MG capsule Take 1 capsule (30 mg total) by mouth daily. 12/24/22   Lomax, Amy, NP  ELIQUIS 5 MG TABS tablet TAKE 1 TABLET BY MOUTH TWICE A DAY 01/28/23   Jake Bathe, MD  HYDROmorphone (DILAUDID) 4 MG tablet Take 1 tablet (4 mg total) by mouth every 6 (six) hours as needed for up to 5 days. 03/18/23 03/23/23  Monica Becton, MD  ipratropium-albuterol (DUONEB) 0.5-2.5 (3) MG/3ML SOLN 1 NEBULE EVERY 6 HOURS AS NEEDED. **J45.3** 03/22/20   Young, Joni Fears D, MD  isosorbide mononitrate (IMDUR) 30 MG 24 hr tablet TAKE 1 TABLET BY MOUTH EVERY DAY 03/10/23   Jake Bathe, MD  metroNIDAZOLE (FLAGYL) 500 MG tablet Take 1 tablet (500 mg total) by mouth 2 (two) times daily for 7 days. 03/18/23 03/25/23  Monica Becton, MD  Nebulizers (COMPRESSOR/NEBULIZER) MISC Use as directed 12/01/17   Waymon Budge, MD  Olodaterol HCl (STRIVERDI RESPIMAT) 2.5 MCG/ACT AERS INHALE 2 PUFFS BY MOUTH INTO THE LUNGS DAILY 03/10/23   Jetty Duhamel D, MD  pantoprazole (PROTONIX) 40 MG tablet TAKE 1 TABLET (40 MG TOTAL) BY MOUTH TWICE A DAY BEFORE MEALS 11/04/22   Monica Becton, MD   potassium chloride SA (KLOR-CON M20) 20 MEQ tablet Take 1 tablet (20 mEq total) by mouth daily. 09/05/21   Jake Bathe, MD  predniSONE (DELTASONE) 10 MG tablet Take 1 tablet (10 mg total) by mouth in the morning and at bedtime. 10/30/22   Waymon Budge, MD  pregabalin (LYRICA) 75 MG capsule Take 1 capsule (75 mg total) by mouth 3 (three) times daily. 05/16/22   Monica Becton, MD  tamsulosin (FLOMAX) 0.4 MG CAPS capsule Take 0.4 mg by mouth at bedtime.    [provider]    Physical Exam: Vitals:   03/19/23 1951 03/19/23 2003 03/19/23 2234  BP: 118/79  128/76  Pulse: (!) 102  85  Resp: (!) 26  (!) 22  Temp: 98.8 F (37.1 C)    TempSrc: Oral    SpO2: 95%  97%  Weight:  90.3 kg   Height:  6\' 2"  (1.88 m)    Constitutional: Resting supine in bed, intermittently wincing in pain Eyes: EOMI, lids and conjunctivae normal ENMT: Mucous membranes are moist. Posterior pharynx clear of any exudate or lesions.Normal dentition.  Neck: normal, supple, no masses. Respiratory: clear to auscultation bilaterally, no wheezing, no crackles. Normal respiratory effort. No accessory muscle use.  Cardiovascular: Regular rate and rhythm, systolic murmur noted. No extremity edema. 2+ pedal pulses. Abdomen: no tenderness, no masses palpated. No hepatosplenomegaly. Bowel sounds positive. GU: No obvious erythema, cellulitis involving testes, penis, or groin area Musculoskeletal: no clubbing / cyanosis. No joint deformity upper and lower extremities. Good ROM, no contractures. Normal muscle tone.  Skin: no rashes, lesions, ulcers. No induration Neurologic: CN 2-12 grossly intact. Sensation intact. Strength 5/5 in all 4.  Psychiatric: Normal judgment and insight. Alert and oriented x 3. Normal mood.   EKG: Personally reviewed. Sinus tachycardia, rate 105, PACs.  Rate is faster when compared to previous.  Assessment/Plan Principal Problem:   Gram-positive cocci bacteremia Active Problems:    Paroxysmal atrial fibrillation (HCC)   Hyperlipidemia   COPD (chronic obstructive pulmonary disease) (HCC)   *** No notes on file *** Assessment and Plan: Bacteremia due to GPC in clusters: ***  Paroxysmal atrial fibrillation: ***  COPD: ***  Hyperlipidemia: ***   DVT prophylaxis: ***  Code Status: ***  Family Communication: ***  Disposition Plan: ***  Consults called: ***  Severity of Illness: {Observation/Inpatient:21159}  Darreld Mclean MD [facility]  If 7PM-7AM, please contact night-coverage [email or web address]  03/19/2023, 11:32 PM

## 2023-03-20 NOTE — Progress Notes (Signed)
Patient arrived to the room with wife present at bedside. Patient have his cell phone and clothing on him. Patient is alert and oriented x4, reported lower sacral/back pain 5/10 PRN med was given. Patient VS stable and was placed on tele monitor; tele unit was called to verify. Patient was oriented to the room and staffs. Call bell within reach, bed alarm initiated.  Accalia Rigdon

## 2023-03-20 NOTE — ED Notes (Signed)
Pharmacy at bedside

## 2023-03-20 NOTE — Progress Notes (Signed)
Mobility Specialist Progress Note:    03/20/23 1451  Mobility  Activity Ambulated with assistance in hallway  Level of Assistance Minimal assist, patient does 75% or more  Assistive Device Front wheel walker  Distance Ambulated (ft) 150 ft  Activity Response Tolerated well  Mobility Referral Yes  $Mobility charge 1 Mobility  Mobility Specialist Start Time (ACUTE ONLY) 1440  Mobility Specialist Stop Time (ACUTE ONLY) 1451  Mobility Specialist Time Calculation (min) (ACUTE ONLY) 11 min   Pt received in bed, family at bedside. Agreeable to mobility session with RW, required MinA to stand, CGA during ambulation. Tolerated well, asx throughout. Returned pt to room, assisted to bathroom. RN aware and in room. All needs met.   Feliciana Rossetti Mobility Specialist Please contact via Special educational needs teacher or  Rehab office at [phone]

## 2023-03-20 NOTE — Plan of Care (Signed)
  Problem: Education: Goal: Knowledge of General Education information will improve Description: Including pain rating scale, medication(s)/side effects and non-pharmacologic comfort measures 03/20/2023 0400 by Jann Milkovich, RN Outcome: Progressing 03/20/2023 0400 by Seda Kronberg, RN Outcome: Progressing   Problem: Health Behavior/Discharge Planning: Goal: Ability to manage health-related needs will improve 03/20/2023 0400 by Montague Corella, RN Outcome: Progressing 03/20/2023 0400 by Aquila Menzie, RN Outcome: Progressing   Problem: Clinical Measurements: Goal: Ability to maintain clinical measurements within normal limits will improve 03/20/2023 0400 by Maylani Embree, RN Outcome: Progressing 03/20/2023 0400 by Arlyce Circle, RN Outcome: Progressing Goal: Diagnostic test results will improve 03/20/2023 0400 by Diem Pagnotta, RN Outcome: Progressing 03/20/2023 0400 by Zacharius Funari, RN Outcome: Progressing Goal: Respiratory complications will improve 03/20/2023 0400 by Laticha Ferrucci, RN Outcome: Progressing 03/20/2023 0400 by Maigan Bittinger, RN Outcome: Progressing Goal: Cardiovascular complication will be avoided 03/20/2023 0400 by Amariz Flamenco, RN Outcome: Progressing 03/20/2023 0400 by Crockett Rallo, RN Outcome: Progressing   Problem: Activity: Goal: Risk for activity intolerance will decrease 03/20/2023 0400 by Perl Kerney, RN Outcome: Progressing 03/20/2023 0400 by Atlanta Pelto, RN Outcome: Progressing   Problem: Nutrition: Goal: Adequate nutrition will be maintained 03/20/2023 0400 by Jere Bostrom, RN Outcome: Progressing 03/20/2023 0400 by Keanu Lesniak, RN Outcome: Progressing   Problem: Coping: Goal: Level of anxiety will decrease 03/20/2023 0400 by Anderson Middlebrooks, RN Outcome: Progressing 03/20/2023 0400 by Cobey Raineri, RN Outcome: Progressing   Problem: Elimination: Goal: Will not experience complications related to bowel motility 03/20/2023 0400 by Normalee Sistare, RN Outcome:  Progressing 03/20/2023 0400 by Chellsea Beckers, RN Outcome: Progressing Goal: Will not experience complications related to urinary retention 03/20/2023 0400 by Skyley Grandmaison, RN Outcome: Progressing 03/20/2023 0400 by Blanton Kardell, RN Outcome: Progressing   Problem: Pain Management: Goal: General experience of comfort will improve 03/20/2023 0400 by Moira Umholtz, RN Outcome: Progressing 03/20/2023 0400 by Lekeya Rollings, RN Outcome: Progressing   Problem: Safety: Goal: Ability to remain free from injury will improve 03/20/2023 0400 by Beatriz Settles, RN Outcome: Progressing 03/20/2023 0400 by Tynlee Bayle, RN Outcome: Progressing   Problem: Skin Integrity: Goal: Risk for impaired skin integrity will decrease 03/20/2023 0400 by Arafat Cocuzza, RN Outcome: Progressing 03/20/2023 0400 by Leeandra Ellerson, RN Outcome: Progressing   Problem: Clinical Measurements: Goal: Will remain free from infection 03/20/2023 0400 by Syed Zukas, RN Outcome: Not Progressing 03/20/2023 0400 by Twylia Oka, RN Outcome: Progressing  Patient actively have an infection that is being treated with IV antibiotics

## 2023-03-20 NOTE — Hospital Course (Addendum)
77 y.o. with h/o PAF on Eliquis, COPD on chronic prednisone 20 mg daily, HTN, chronic lower extremity edema, and hx of lumbosacral fusion who is admitted with bacteremia due to [facility] in clusters that was identified on outpatient work-up.  He has long-standing venous insufficiency without recent wound care follow up (last in 08/2021) and noncompliance with compression.  Wound care consulted and recommends Medihoney with wraps.

## 2023-03-20 NOTE — Consult Note (Signed)
[facility]    Date of Admission:  03/19/2023   Total days of antibiotics 2        Day 1 Cefepime         Day 1 Flagyl         Day 2 Vanc       Reason for Consult: Bacteremia     Referring Provider: Jonah Blue, Darreld Mclean  Primary Care Provider: Rodney Langton at [facility]    Assessment: This is a pleasant 77 year old male with pertinent past medical history of A-Fib on Eliquis, COPD on chronic prednisone, HTN, hx of lumbosacral fusion who was told to present to ER from PCP for suspected bacteremia. Patient had a mechanical fall last week Wednesday and had presented to ED the following Friday for sacrococcygeal pain. Patient reports that he was with his dogs, backed up and tripped over one of his dogs, fell on his tail bone. He reports overall pain at the sacrococcygeal area and mild swelling. Patient also reports intermittent low grade fevers ongoing for several months, currently denies any fevers or chills. Patient was seen at his PCP office on 10/22 for intermittent fevers, noted to have WBC elevated to 23.3, ESR 51, CRP 233, UA negative, 10/23 noted to have GPC in clusters one set of blood cultures, 10/23 CT Abd pelvis showed Mild subcutaneous edema of the inferior gluteal regions, left >R, and along the gluteal cleft with notable lumbosacral fusion hardware. Patient denies any recent history of antibiotics use. Denies any history of pacemaker or valve replacement. Denies any joint pains. In the ED, patient was received one dose of cefepime and flagyl with continuing Vanc to cover for possible cellulitis. ID consulted for possible bacteremia. Per PE, TTP at the sacrococcygeal site and gluteal cleft, no obvious open wounds or fluctuation. He does have a large ulcerated eschar on his R antero-lateral shin. Otherwise, he is currently afebrile with WBC trending downwards at WBC 14.9. Repeat cultures pending.   Plan: Follow up on repeat blood  cultures Continue Vancomycin for now until blood cultures result  Continue wound care as per RN  Remaining medical and supportive care per primary team     Principal Problem:   Gram-positive cocci bacteremia Active Problems:   Paroxysmal atrial fibrillation (HCC)   Hyperlipidemia   COPD (chronic obstructive pulmonary disease) (HCC)   Scheduled Meds:  apixaban  5 mg Oral BID   arformoterol  15 mcg Nebulization BID   atorvastatin  20 mg Oral Daily   diltiazem  180 mg Oral Daily   DULoxetine  30 mg Oral Daily   leptospermum manuka honey  1 Application Topical Daily   pantoprazole  40 mg Oral BID AC   predniSONE  10 mg Oral BID   pregabalin  75 mg Oral BID   sodium chloride flush  3 mL Intravenous Q12H   tamsulosin  0.4 mg Oral QHS   Continuous Infusions:  vancomycin     PRN Meds:.acetaminophen **OR** acetaminophen, HYDROcodone-acetaminophen, ipratropium-albuterol, morphine injection, ondansetron **OR** ondansetron (ZOFRAN) IV, senna-docusate  HPI: Levi [name] is a 77 y.o. male with past medical history of Atrial Fibrillation on Eliquis, COPD on chronic prednisone, HTN, hx of lumbosacral fusion who was told to present to ER from PCP for suspected bacteremia. Patient had a mechanical fall last week Wednesday and had presented to ED the following Friday sacrococcygeal pain. Patient reports that he was with his dogs, backed  up and tripped over one of his dogs, fell on his tail bone. He reports overall pain at the sacrococcygeal area and mild swelling. Patient also reports intermittent low grade fevers ongoing for several months, currently denies any fevers or chills. Patient was seen at his PCP office on 10/22 for intermittent fevers, noted to have WBC elevated to 23.3, ESR 51, CRP 233, UA negative, 10/23 noted to have GPC in clusters one set of blood cultures, 10/23 CT Abd pelvis showed Mild subcutaneous edema of the inferior gluteal regions, left >R, and along the gluteal cleft with  notable lumbosacral fusion hardware. Subsequently patient advised by PCP 10/23 to go to ED for further evaluation. Patient denies any recent history of antibiotics use. Denies any history of pacemaker or valve replacement. Denies any joint pains. In the ED, patient was started on Vanc, cefepime and flagyl to cover for possible cellulitis. Otherwise no other concerns.     Review of Systems: AS per HPI.   Past Medical History:  Diagnosis Date   Agent orange exposure 1970's   takes Imdur,Diltiazem, and Atacand daily   ALLERGIC RHINITIS    Allergy    seasonal allergies   Asthma    uses inhaler and nebulizers   Bruises easily    d/t prednisone daily   Cataract    CHF (congestive heart failure) (HCC)    dx-on meds to treat   Chronic bronchitis (HCC)    "used to get it q yr; last time ?2008" (04/28/2012)   Clotting disorder (HCC) 2019   On blood thinner when I bleed it is for a consideral amount of time. This is tegardless of size of wound.   COPD (chronic obstructive pulmonary disease) (HCC)    agent orange exposure   Degenerative disk disease    "everywhere" (04/28/2012)   Diverticulitis    Dysrhythmia    afib   Elevated uric acid in blood    takes Allopurinol daily   Emphysema of lung (HCC)    Enlarged prostate    but not on any meds   GERD (gastroesophageal reflux disease)    takes Protonix daily   H/O hiatal hernia    Headache    History of pneumonia    a. 2010   Hyperlipidemia    takes Lipitor daily; pt. states he takes a preventive   Hypothyroidism    Joint pain    Joint swelling    Nausea and vomiting 11/12/2022   Neuromuscular disorder (HCC)    bilat legs, and bilat arms   Osteoporosis 2012   PAF (paroxysmal atrial fibrillation) (HCC)    a. Dx 04/2012, CHA2DS2VASc = 1 (age);  b. 04/2012 Echo: EF 40-50%, mild MR.   Personal history of colonic adenomas 02/09/2013   Pneumonia    Shortness of breath dyspnea    Thyroid disease    no taking meds-caused dizziness     Social History   Tobacco Use   Smoking status: Never   Smokeless tobacco: Never  Vaping Use   Vaping status: Never Used  Substance Use Topics   Alcohol use: No   Drug use: No    Family History  Problem Relation Age of Onset   Diabetes Mother    Heart disease Father        Died 9, MI   Cancer Father    Hyperlipidemia Father    Stroke Father    Prostate cancer Brother    Colon polyps Brother    Colon polyps Brother  Asthma Brother    COPD Brother    Colon cancer Brother        Dx age 53   Cancer Brother    Colon cancer Other    Esophageal cancer Neg Hx    Rectal cancer Neg Hx    Stomach cancer Neg Hx    Pancreatic cancer Neg Hx    Kidney disease Neg Hx    Liver disease Neg Hx    Neuropathy Neg Hx    Allergies  Allergen Reactions   Levaquin [Levofloxacin] Itching    OBJECTIVE: Blood pressure 133/78, pulse 87, temperature 97.9 F (36.6 C), temperature source Oral, resp. rate 20, height 6\' 2"  (1.88 m), weight 90.3 kg, SpO2 96%.  Physical Exam  General: Alert, laying in bed comfortably, no acute distress CV: Regular Rate. Pulm: Lungs clear to auscultation MSK: +TTP sacrococcygeal spinous process and gluteal cleft, no open wounds or discharge +Right ulcerated eschar wound on lateral shin [image below] with minimal transudate drainage    Lab Results Lab Results  Component Value Date   WBC 14.9 (H) 03/20/2023   HGB 9.5 (L) 03/20/2023   HCT 29.2 (L) 03/20/2023   MCV 91.3 03/20/2023   PLT 146 (L) 03/20/2023    Lab Results  Component Value Date   CREATININE 0.91 03/20/2023   BUN 21 03/20/2023   NA 136 03/20/2023   K 3.1 (L) 03/20/2023   CL 104 03/20/2023   CO2 25 03/20/2023    Lab Results  Component Value Date   ALT 23 03/19/2023   AST 34 03/19/2023   ALKPHOS 103 03/19/2023   BILITOT 1.1 03/19/2023     Microbiology: Recent Results (from the past 240 hour(s))  Microscopic Examination     Status: None   Collection Time: 03/18/23  2:11 PM   Result Value Ref Range Status   WBC, UA None seen 0 - 5 /hpf Final   RBC, Urine None seen 0 - 2 /hpf Final   Epithelial Cells (non renal) None seen 0 - 10 /hpf Final   Casts None seen None seen /lpf Final   Bacteria, UA None seen None seen/Few Final  Culture, blood (single) w Reflex to ID Panel     Status: Abnormal (Preliminary result)   Collection Time: 03/18/23  2:15 PM   Specimen: Blood   BD  Result Value Ref Range Status   BLOOD CULTURE, ROUTINE Preliminary report (A)  Preliminary   Organism ID, Bacteria Comment (A)  Preliminary    Comment: Gram positive cocci in clusters Recovered from aerobic and anaerobic bottles.   Culture, blood (Routine x 2)     Status: None (Preliminary result)   Collection Time: 03/19/23  8:15 PM   Specimen: BLOOD  Result Value Ref Range Status   Specimen Description BLOOD SITE NOT SPECIFIED  Final   Special Requests   Final    BOTTLES DRAWN AEROBIC AND ANAEROBIC Blood Culture adequate volume   Culture   Final    NO GROWTH < 12 HOURS Performed at [facility], [address] [place], [place] [address]    Report Status PENDING  Incomplete  Culture, blood (Routine x 2)     Status: None (Preliminary result)   Collection Time: 03/19/23 10:24 PM   Specimen: BLOOD  Result Value Ref Range Status   Specimen Description BLOOD SITE NOT SPECIFIED  Final   Special Requests   Final    BOTTLES DRAWN AEROBIC AND ANAEROBIC Blood Culture adequate volume   Culture  Final    NO GROWTH < 12 HOURS Performed at [facility], [address] [place], [place] [address]    Report Status PENDING  Incomplete    Melvia Heaps, PGY 1 [facility] [phone] pager   [phone] cell 03/20/2023, 11:56 AM

## 2023-03-20 NOTE — Progress Notes (Signed)
Pharmacy Antibiotic Note  Levi [name] is a 77 y.o. male admitted on 03/19/2023 with bacteremia. Blood cultures GPC positive from outpatient office visit and instructions to present to ED. Repeat blood culture drawn in ED. Pharmacy has been consulted for vancomycin dosing.  Plan: Vancomycin 2g q24h (eAUC 505, Scr 1.09) F/u micro data, renal function and vancomycin levels as indicated   Height: 6\' 2"  (188 cm) Weight: 90.3 kg (199 lb 1.2 oz) IBW/kg (Calculated) : 82.2  Temp (24hrs), Avg:98.8 F (37.1 C), Min:98.7 F (37.1 C), Max:98.8 F (37.1 C)  Recent Labs  Lab 03/18/23 1411 03/19/23 2015 03/19/23 2024  WBC 23.3* 22.6*  --   CREATININE 1.04 1.09  --   LATICACIDVEN  --   --  1.4    Estimated Creatinine Clearance: 68.1 mL/min (by C-G formula based on SCr of 1.09 mg/dL).    Allergies  Allergen Reactions   Levaquin [Levofloxacin] Itching    Antimicrobials this admission: Cefepime 10/23 x 1 Flagyl 10/23 x 1 Vancomycin 10/23 >   Microbiology results: 10/22 BCx: gram positive cocci in clusters   Thank you for allowing pharmacy to be a part of this patient's care.  Marja Kays 03/20/2023 12:26 AM

## 2023-03-20 NOTE — Consult Note (Signed)
WOC Nurse Consult Note: patient with longstanding history of venous insufficiency with recurrent venous ulcers; followed most recently by primary MD although [facility] last [facility] in 08/2021; per last note by primary MD patient not compliant with compression  Reason for Consult: chronic wound RLE Wound type: full thickness likely r/t venous insufficiency although there are notes of prior trauma  Pressure Injury POA: NA  Measurement: R lower lateral leg wound 7 cm x 5 cm  Wound bed: 90% brown eschar 10% pink moist  Drainage (amount, consistency, odor) moderate tan exudate  Periwound:  bilateral legs edematous, erythematous L leg >R; warmth present in both legs L>R;  evidence of prior healed venous ulcers on bilateral legs Dressing procedure/placement/frequency: Clean R lower lateral leg wound with NS, apply Medihoney to entire wound bed daily, cover with dry gauze and silicone foam. May lift foam daily to replace Medihoney.  Change foam q3 days and prn soiling.   Discussed with patient that venous insufficiency is a condition that must be managed.  Patient acknowledges he has had recurrent ulcerations.  Discussed the importance of compression and follow-up with wound care center/vascular/primary MD at discharge.   POC discussed with bedside nurse. WOC team will not follow.  Re-consult if further needs arise.   Thank you,    Priscella Mann MSN, RN-BC, [employer] [phone]

## 2023-03-21 ENCOUNTER — Encounter (HOSPITAL_COMMUNITY)
Admission: EM | Disposition: A | Discharge status: Home / Self Care | Payer: Self-pay | Source: Home / Self Care | Attending: Internal Medicine

## 2023-03-21 ENCOUNTER — Other Ambulatory Visit (HOSPITAL_COMMUNITY): Payer: Medicare Other

## 2023-03-21 DIAGNOSIS — R7881 Bacteremia: Secondary | ICD-10-CM | POA: Diagnosis not present

## 2023-03-21 LAB — CULTURE, BLOOD (SINGLE)

## 2023-03-21 LAB — BASIC METABOLIC PANEL
Anion gap: 10 (ref 5–15)
BUN: 15 mg/dL (ref 8–23)
CO2: 25 mmol/L (ref 22–32)
Calcium: 8.6 mg/dL — ABNORMAL LOW (ref 8.9–10.3)
Chloride: 103 mmol/L (ref 98–111)
Creatinine, Ser: 0.77 mg/dL (ref 0.61–1.24)
GFR, Estimated: >60 mL/min (ref >60)
Glucose, Bld: 98 mg/dL (ref 70–99)
Potassium: 3.5 mmol/L (ref 3.5–5.1)
Sodium: 138 mmol/L (ref 135–145)

## 2023-03-21 LAB — CBC WITH DIFFERENTIAL/PLATELET
Abs Immature Granulocytes: 0.59 10*3/uL — ABNORMAL HIGH (ref 0.00–0.07)
Basophils Absolute: 0.1 10*3/uL (ref 0.0–0.1)
Basophils Relative: 0 %
Eosinophils Absolute: 0 10*3/uL (ref 0.0–0.5)
Eosinophils Relative: 0 %
HCT: 31.8 % — ABNORMAL LOW (ref 39.0–52.0)
Hemoglobin: 10.4 g/dL — ABNORMAL LOW (ref 13.0–17.0)
Immature Granulocytes: 5 %
Lymphocytes Relative: 8 %
Lymphs Abs: 0.9 10*3/uL (ref 0.7–4.0)
MCH: 30.1 pg (ref 26.0–34.0)
MCHC: 32.7 g/dL (ref 30.0–36.0)
MCV: 92.2 fL (ref 80.0–100.0)
Monocytes Absolute: 0.8 10*3/uL (ref 0.1–1.0)
Monocytes Relative: 7 %
Neutro Abs: 9.3 10*3/uL — ABNORMAL HIGH (ref 1.7–7.7)
Neutrophils Relative %: 80 %
Platelets: 183 10*3/uL (ref 150–400)
RBC: 3.45 MIL/uL — ABNORMAL LOW (ref 4.22–5.81)
RDW: 16.5 % — ABNORMAL HIGH (ref 11.5–15.5)
WBC: 11.6 10*3/uL — ABNORMAL HIGH (ref 4.0–10.5)
nRBC: 0 % (ref 0.0–0.2)

## 2023-03-21 SURGERY — TRANSESOPHAGEAL ECHOCARDIOGRAM (TEE) (CATHLAB)
Anesthesia: Monitor Anesthesia Care

## 2023-03-21 MED ORDER — CEFAZOLIN SODIUM-DEXTROSE 2-4 GM/100ML-% IV SOLN
2.0000 g | Freq: Three times a day (TID) | INTRAVENOUS | Status: DC
  Administered 2023-03-21 – 2023-03-24 (×10): 2 g via INTRAVENOUS
  Filled 2023-03-21 (×10): qty 100

## 2023-03-21 NOTE — Progress Notes (Signed)
Mobility Specialist Progress Note:    03/21/23 0941  Mobility  Activity Ambulated with assistance in hallway  Level of Assistance Contact guard assist, steadying assist  Assistive Device Front wheel walker  Distance Ambulated (ft) 300 ft  Activity Response Tolerated well  Mobility Referral Yes  $Mobility charge 1 Mobility  Mobility Specialist Start Time (ACUTE ONLY) 0900  Mobility Specialist Stop Time (ACUTE ONLY) 0910  Mobility Specialist Time Calculation (min) (ACUTE ONLY) 10 min   Pt received sitting EOB, agreeable to mobility session. Tolerated well, asx throughout. Returned pt to room w/o fault. Left pt with all needs met, call bell in reach.   Feliciana Rossetti Mobility Specialist Please contact via Special educational needs teacher or  Rehab office at [phone]

## 2023-03-21 NOTE — Progress Notes (Signed)
Mobility Specialist Progress Note:   03/21/23 1424  Mobility  Activity Ambulated with assistance in hallway  Level of Assistance Minimal assist, patient does 75% or more  Assistive Device Front wheel walker  Distance Ambulated (ft) 150 ft  Activity Response Tolerated well  Mobility Referral Yes  $Mobility charge 1 Mobility  Mobility Specialist Start Time (ACUTE ONLY) 1400  Mobility Specialist Stop Time (ACUTE ONLY) 1410  Mobility Specialist Time Calculation (min) (ACUTE ONLY) 10 min   Pt received sitting EOB, wife in room. This session required MinA to stand, otherwise CGA during ambulation with RW. Pt c/o tailbone pain. Agreeable to ambulating in hallway this afternoon. Tolerated well, asx throughout. Returned pt to room, all needs met.   Feliciana Rossetti Mobility Specialist Please contact via Special educational needs teacher or  Rehab office at [phone]

## 2023-03-21 NOTE — Progress Notes (Addendum)
Progress Note   Patient: Levi [name] UXN:[id number] DOB: [birth year] DOA: 03/19/2023     2 DOS: the patient was seen and examined on 03/21/2023   Brief hospital course: 77 y.o. with h/o PAF on Eliquis, COPD on chronic prednisone 20 mg daily, HTN, chronic lower extremity edema, and hx of lumbosacral fusion who is admitted with bacteremia due to [facility] in clusters that was identified on outpatient work-up.  He has long-standing venous insufficiency without recent wound care follow up (last in 08/2021) and noncompliance with compression.  Wound care consulted and recommends Medihoney with wraps.  Assessment and Plan:  Bacteremia due to [facility] in clusters -Growing on outpatient blood culture collected 10/22 - C&S pending, ?contaminant -Has been on doxy and flagyl at home since 10/22 -He reports periodic fevers to 100.5 for several months -CT pelvis w/ contrast did not show drainable collection or soft tissue gas at gluteal region -He has significant hardware and hardware infection is considered but no frank pain other than R hip (with negative pelvic imaging other than R pubic rami fractures) -He did receive 3 doses of doxy + flagyl on 10/22-23 prior to admission -Continue IV vancomycin -Follow blood cultures NTD from 10/23 -ID consulted -Echo done with EF 40-45%, grade 1 DD, no obvious vegetations -TEE planned for 10/25 but canceled by ID since there is uncertainty about the + blood culture; he is now planned for TEE today Monday 10/28   Venous insufficiency wounds -Wound care consulted -Very possible source of infection as both legs are erythematous, edematous, and with dry necrotic tissue per wound care -Wound to R lower leg needs medihoney    Paroxysmal atrial fibrillation -Stable, in sinus rhythm on admission   -Continue diltiazem and Eliquis   COPD -Stable on admission -Continue olodaterol, DuoNebs as needed, chronic prednisone 10 mg BID -He is immunocompromised due to daily  prednisone and so at increased risk for opportunistic infections   Hyperlipidemia -Continue atorvastatin   Chronic neuropathy -Continue Cymbalta and Lyrica   BPH -Continue Flomax   CAD/CABG -Continue Imdur -No report of chest pain       Consultants: ID Wound Care Cardiology   Procedures: Echocardiogram 10/24 TEE 10/25   Antibiotics: Vancomycin 10/23- Metronidazole x 1 Cefepime x 1    30 Day Unplanned Readmission Risk Score    Flowsheet Row ED to Hosp-Admission (Current) from 03/19/2023 in [facility]  30 Day Unplanned Readmission Risk Score (%) 20.06 Filed at 03/21/2023 0401       This score is the patient's risk of an unplanned readmission within 30 days of being discharged (0 -100%). The score is based on dignosis, age, lab data, medications, orders, and past utilization.   Low:  0-14.9   Medium: 15-21.9   High: 22-29.9   Extreme: 30 and above           Subjective: Feeling better overall.  Having coccygeal pain, some difficulty with mobility but this is not significantly different from baseline.  He would like to go home this weekend, if possible.   Objective: Vitals:   03/21/23 0022 03/21/23 0444  BP: 138/81 125/85  Pulse: 85 92  Resp: 18   Temp: 98.8 F (37.1 C) 97.7 F (36.5 C)  SpO2: 91% 94%    Intake/Output Summary (Last 24 hours) at 03/21/2023 0801 Last data filed at 03/21/2023 0022 Gross per 24 hour  Intake 203 ml  Output 280 ml  Net -77 ml   [employer]  03/19/23 2003  Weight: 90.3 kg    Exam:  General:  Appears calm and comfortable and is in NAD Eyes:  PERRL, EOMI, normal lids, iris ENT:  grossly normal hearing, lips & tongue, mmm Neck:  no LAD, masses or thyromegaly Cardiovascular:  RRR, with 3/6 systolic murmur. 1-2+ LE edema.  Respiratory:   CTA bilaterally with no wheezes/rales/rhonchi.  Normal respiratory effort. Abdomen:  soft, NT, ND Skin:  B LE venous insufficiency with skin changes, erythema,  edema; R lateral lower leg is covered, has a wound from running into someone with a walker Musculoskeletal:  grossly normal tone BUE/BLE, good ROM, no bony abnormality; Charcot foot on the right Psychiatric:  grossly normal mood and affect, speech fluent and appropriate, AOx3 Neurologic:  CN 2-12 grossly intact, moves all extremities in coordinated fashion  Data Reviewed: I have reviewed the patient's lab results since admission.  Pertinent labs for today include:   Unremarkable BMP WBC 11.6, down from 22.6 on 10/23 Hgb 10.4 - stable    Family Communication: None present; I spoke with his wife by telephone  Disposition: Status is: Inpatient Remains inpatient appropriate because: ongoing evaluation and management     Time spent: 50 minutes  Unresulted Labs (From admission, onward)     Start     Ordered   03/21/23 0500  CBC with Differential/Platelet  Tomorrow morning,   R        03/20/23 1432   03/21/23 0500  Basic metabolic panel  Tomorrow morning,   R        03/20/23 1432             Author: Jonah Blue, MD 03/21/2023 8:01 AM  For on call review [email or web address].

## 2023-03-21 NOTE — Plan of Care (Signed)

## 2023-03-21 NOTE — Progress Notes (Signed)
[facility]  Date of Admission:  03/19/2023   Total days of antibiotics 3        Day 1 Cefepime (stop)        Day 1 Flagyl (stop)        Day 3 Vanc (continue) ASSESSMENT: This is a 77 year old male with past medical history of paroxysmal A-fib on Eliquis, COPD on chronic prednisone suppression 10 mg twice daily, hypertension, lower extremity edema, history lumbosacral fusion noted to have low back pain after fall following running after his dogs.  Patient was seen at his PCP office on 10/22 for intermittent fevers, noted to have WBC elevated to 23.3, ESR 51, CRP 233, UA negative, 10/23 noted to have GPC in clusters one set of blood cultures, 10/23 CT Abd pelvis showed Mild subcutaneous edema of the inferior gluteal regions, left >R, and along the gluteal cleft with notable lumbosacral fusion hardware.  On exam today, patient reports mild tenderness to palpation at the gluteal cleft, no other concerns at this time.  He is afebrile and WBC 11.6 down trending WBC 14.9.  Blood cultures so far no growth x 2, likely initial blood culture collected 10/22 outpatient was contaminant as repeat cultures x2 showed no growth. Low suspicion for endocarditis, therefore, currently do not recommend TTE.   PLAN: Continue IV vancomycin Will Follow-up on repeat blood cultures Continue wound care as per RN  Remaining medical and supportive care per primary team   Principal Problem:   Gram-positive cocci bacteremia Active Problems:   Paroxysmal atrial fibrillation (HCC)   Hyperlipidemia   COPD (chronic obstructive pulmonary disease) (HCC)   Peripheral neuropathy   BPH (benign prostatic hyperplasia)   Venous insufficiency of both lower extremities   Scheduled Meds:  allopurinol  100 mg Oral q AM   apixaban  5 mg Oral BID   arformoterol  15 mcg Nebulization BID   atorvastatin  20 mg Oral Daily   cyclobenzaprine  10 mg Oral QHS   diltiazem  180 mg Oral Daily    DULoxetine  30 mg Oral Daily   isosorbide mononitrate  30 mg Oral Daily   leptospermum manuka honey  1 Application Topical Daily   pantoprazole  40 mg Oral BID AC   predniSONE  10 mg Oral BID   pregabalin  75 mg Oral BID   sodium chloride flush  3 mL Intravenous Q12H   tamsulosin  0.4 mg Oral QHS   Continuous Infusions:   ceFAZolin (ANCEF) IV     PRN Meds:.acetaminophen **OR** acetaminophen, albuterol, diltiazem, HYDROcodone-acetaminophen, ipratropium-albuterol, morphine injection, ondansetron **OR** ondansetron (ZOFRAN) IV, senna-docusate   SUBJECTIVE: Patient was evaluated at bedside today, patient reports overall he is doing well.  He reports mild tenderness at the sacrococcygeal region and gluteal cleft, however denies any fevers, chills, nausea, vomiting, diarrhea.  No other concerns at this time.  Review of Systems: As per above  Allergies  Allergen Reactions   Levaquin [Levofloxacin] Itching    OBJECTIVE: Vitals:   03/21/23 0022 03/21/23 0444 03/21/23 0841 03/21/23 0850  BP: 138/81 125/85 133/82   Pulse: 85 92 85   Resp: 18  18   Temp: 98.8 F (37.1 C) 97.7 F (36.5 C)    TempSrc: Oral Oral    SpO2: 91% 94% 95% 96%  Weight:      Height:       Body mass index is 25.56 kg/m.  Physical Exam General: Alert, laying comfortably  in bed, no acute distress CV: Regular late Pulm: Lungs are clear to auscultation, no wheezing or crackles MSK: Mild tenderness to palpation at the sacrococcygeal spinous process and gluteal cleft.  Lab Results Lab Results  Component Value Date   WBC 11.6 (H) 03/21/2023   HGB 10.4 (L) 03/21/2023   HCT 31.8 (L) 03/21/2023   MCV 92.2 03/21/2023   PLT 183 03/21/2023    Lab Results  Component Value Date   CREATININE 0.77 03/21/2023   BUN 15 03/21/2023   NA 138 03/21/2023   K 3.5 03/21/2023   CL 103 03/21/2023   CO2 25 03/21/2023    Lab Results  Component Value Date   ALT 23 03/19/2023   AST 34 03/19/2023   ALKPHOS 103  03/19/2023   BILITOT 1.1 03/19/2023     Microbiology: Recent Results (from the past 240 hour(s))  Microscopic Examination     Status: None   Collection Time: 03/18/23  2:11 PM  Result Value Ref Range Status   WBC, UA None seen 0 - 5 /hpf Final   RBC, Urine None seen 0 - 2 /hpf Final   Epithelial Cells (non renal) None seen 0 - 10 /hpf Final   Casts None seen None seen /lpf Final   Bacteria, UA None seen None seen/Few Final  Culture, blood (single) w Reflex to ID Panel     Status: Abnormal   Collection Time: 03/18/23  2:15 PM   Specimen: Blood   BD  Result Value Ref Range Status   BLOOD CULTURE, ROUTINE Final report (A)  Final   Organism ID, Bacteria Staphylococcus aureus (A)  Final    Comment: Based on susceptibility to oxacillin this isolate would be susceptible to: *Penicillinase-stable penicillins, such as:   Cloxacillin, Dicloxacillin, Nafcillin *Beta-lactam combination agents, such as:   Amoxicillin-clavulanic acid, Ampicillin-sulbactam,   Piperacillin-tazobactam *Oral cephems, such as:   Cefaclor, Cefdinir, Cefpodoxime, Cefprozil, Cefuroxime,   Cephalexin, Loracarbef *Parenteral cephems, such as:   Cefazolin, Cefepime, Cefotaxime, Cefotetan, Ceftaroline,   Ceftizoxime, Ceftriaxone, Cefuroxime *Carbapenems, such as:   Doripenem, Ertapenem, Imipenem, Meropenem Recovered from aerobic and anaerobic bottles.    Antimicrobial Susceptibility Comment  Final    Comment:       ** S = Susceptible; I = Intermediate; R = Resistant **                    P = Positive; N = Negative             MICS are expressed in micrograms per mL    Antibiotic                 RSLT#1    RSLT#2    RSLT#3    RSLT#4 Ciprofloxacin                  S Clindamycin                    S Erythromycin                   R Gentamicin                     S Levofloxacin                   S Linezolid                      S Moxifloxacin  S Oxacillin                      S Penicillin                      R Quinupristin/Dalfopristin      S Rifampin                       S Tetracycline                   S Trimethoprim/Sulfa             S Vancomycin                     S   Culture, blood (Routine x 2)     Status: None (Preliminary result)   Collection Time: 03/19/23  8:15 PM   Specimen: BLOOD  Result Value Ref Range Status   Specimen Description BLOOD SITE NOT SPECIFIED  Final   Special Requests   Final    BOTTLES DRAWN AEROBIC AND ANAEROBIC Blood Culture adequate volume   Culture   Final    NO GROWTH 2 DAYS Performed at [facility], [address] [place], [place] [address]    Report Status PENDING  Incomplete  Culture, blood (Routine x 2)     Status: None (Preliminary result)   Collection Time: 03/19/23 10:24 PM   Specimen: BLOOD  Result Value Ref Range Status   Specimen Description BLOOD SITE NOT SPECIFIED  Final   Special Requests   Final    BOTTLES DRAWN AEROBIC AND ANAEROBIC Blood Culture adequate volume   Culture   Final    NO GROWTH 2 DAYS Performed at [facility], [address] [place], [place] [address]    Report Status PENDING  Incomplete    Jeral Pinch, MD [facility] [phone] pager   [phone] cell 03/21/2023, 2:10 PM

## 2023-03-22 ENCOUNTER — Inpatient Hospital Stay (HOSPITAL_COMMUNITY): Payer: Medicare Other

## 2023-03-22 DIAGNOSIS — R7881 Bacteremia: Secondary | ICD-10-CM | POA: Diagnosis not present

## 2023-03-22 LAB — BASIC METABOLIC PANEL
Anion gap: 9 (ref 5–15)
BUN: 18 mg/dL (ref 8–23)
CO2: 25 mmol/L (ref 22–32)
Calcium: 8.6 mg/dL — ABNORMAL LOW (ref 8.9–10.3)
Chloride: 98 mmol/L (ref 98–111)
Creatinine, Ser: 0.78 mg/dL (ref 0.61–1.24)
GFR, Estimated: >60 mL/min (ref >60)
Glucose, Bld: 126 mg/dL — ABNORMAL HIGH (ref 70–99)
Potassium: 3.9 mmol/L (ref 3.5–5.1)
Sodium: 132 mmol/L — ABNORMAL LOW (ref 135–145)

## 2023-03-22 LAB — CBC WITH DIFFERENTIAL/PLATELET
Abs Immature Granulocytes: 0 10*3/uL (ref 0.00–0.07)
Basophils Absolute: 0 10*3/uL (ref 0.0–0.1)
Basophils Relative: 0 %
Eosinophils Absolute: 0 10*3/uL (ref 0.0–0.5)
Eosinophils Relative: 0 %
HCT: 32.7 % — ABNORMAL LOW (ref 39.0–52.0)
Hemoglobin: 10.8 g/dL — ABNORMAL LOW (ref 13.0–17.0)
Lymphocytes Relative: 10 %
Lymphs Abs: 1.1 10*3/uL (ref 0.7–4.0)
MCH: 30.5 pg (ref 26.0–34.0)
MCHC: 33 g/dL (ref 30.0–36.0)
MCV: 92.4 fL (ref 80.0–100.0)
Monocytes Absolute: 0.6 10*3/uL (ref 0.1–1.0)
Monocytes Relative: 5 %
Neutro Abs: 9.4 10*3/uL — ABNORMAL HIGH (ref 1.7–7.7)
Neutrophils Relative %: 85 %
Platelets: 194 10*3/uL (ref 150–400)
RBC: 3.54 MIL/uL — ABNORMAL LOW (ref 4.22–5.81)
RDW: 16.4 % — ABNORMAL HIGH (ref 11.5–15.5)
WBC: 11.1 10*3/uL — ABNORMAL HIGH (ref 4.0–10.5)
nRBC: 0 % (ref 0.0–0.2)
nRBC: 0 /100{WBCs}

## 2023-03-22 MED ORDER — GADOBUTROL 1 MMOL/ML IV SOLN
8.0000 mL | Freq: Once | INTRAVENOUS | Status: AC | PRN
  Administered 2023-03-22: 8 mL via INTRAVENOUS

## 2023-03-22 MED ORDER — LORAZEPAM 2 MG/ML IJ SOLN
1.0000 mg | Freq: Once | INTRAMUSCULAR | Status: AC | PRN
  Administered 2023-03-22: 1 mg via INTRAVENOUS
  Filled 2023-03-22: qty 1

## 2023-03-22 NOTE — Plan of Care (Signed)
  Problem: Activity: Goal: Risk for activity intolerance will decrease Outcome: Progressing   Problem: Nutrition: Goal: Adequate nutrition will be maintained Outcome: Progressing   Problem: Coping: Goal: Level of anxiety will decrease Outcome: Progressing   Problem: Safety: Goal: Ability to remain free from injury will improve Outcome: Progressing   

## 2023-03-22 NOTE — Progress Notes (Deleted)
Subjective:    Patient ID: Levi [name], male    DOB: 08/04/1945, 77 y.o.   MRN: [id number] HPI  Male never smoker followed for allergy/asthma, rhinitis, history  ABPA, complicated by PAfib, GERD PFT- 01/04/2015-minimal restriction, minimal diffusion defect, insignificant response to bronchodilator. FVC 4.55/86%, FEV1 3.28/83%, FEV1/FVC 0.72, TLC 75%, DLCO 77 Office Spirometry 10/25/16-mild restriction of exhaled volume, minimal obstruction. FVC 4.04/78%, FEV1 2.71/71%, ratio 0.67, FEF 25-75% 1.68/58% Respiratory allergy profile 10/26/15- total IgE 60, minor elevations for dust mites and Aspergillus fumigatus ---------------------------------------------------------------------------------------------------.   09/23/22- 77 year old male never smoker followed for allergy/asthma, rhinitis, history ABPA, complicated by PAfib, CHF, GERD, Hypogammaglobulinemia, Chronic Steroid Therapy,  -Striverdi 2 puffs daily, pred maint 10 mg daily, Neb Duoneb,  albuterol hfa Recent fall with right rib fx He is working with cardiology actively now on congestive heart failure and understands the overlap of symptoms.  Asthma control has been good.  He does continue maintenance prednisone and Striverdi.  Occasional transient wheeze but no significant events.  Unfortunately he has had trouble with peripheral neuropathy, falls and fractures. CXR/ Right ribs 09/05/22- IMPRESSION: There is recent minimally displaced fracture in the anterolateral aspect of right ninth rib. There is no focal pulmonary consolidation. There is no pneumothorax. Deformities are seen in multiple bilateral ribs consistent with old healed fractures.  Addendum 10/30/22- surgical clearance. He is clear from pulmonary standpoint for planned foot surgery.  Recommend cardiac clearance. Note he is on maintenance prednisone.  03/25/23- 77 year old male never smoker followed for allergy/asthma, rhinitis, history ABPA, complicated by PAfib/ Eliquis,  CHF, GERD, Hypogammaglobulinemia, Chronic Steroid Therapy,  -Striverdi 2 puffs daily, pred maint 20 mg daily, Neb Duoneb,  albuterol hfa Hosp 10/24>MSSA sepsis, peripheral venous insufficiency  CXR 03/19/23- IMPRESSION: 1. No acute chest findings. 2. Chronic elevation of left hemidiaphragm.  ROS-see HPI   + = positive Constitutional:    weight loss, night sweats, fevers, chills, fatigue, lassitude. HEENT:    headaches, difficulty swallowing, tooth/dental problems, sore throat,       sneezing, itching, ear ache, nasal congestion, post nasal drip, snoring CV:    chest pain, orthopnea, PND, +swelling in lower extremities, anasarca,                                            dizziness, palpitations Resp:   + shortness of breath with exertion or at rest.                productive cough,  + non-productive cough, coughing up of blood.              change in color of mucus.   wheezing.   Skin:    rash or lesions. GI:  No-   heartburn, indigestion, abdominal pain, nausea, vomiting, diarrhea,                 change in bowel habits, loss of appetite GU: dysuria, change in color of urine, no urgency or frequency.   flank pain. MS:   joint pain, stiffness, decreased range of motion, back pain.. L scapula not tender. Neuro-     nothing unusual Psych:  change in mood or affect.  depression or anxiety.   memory loss.    Objective:  OBJ- Physical Exam General- Alert, Oriented, Affect-appropriate, Distress- none acute, tall/thin Skin- rash-none, lesions- none, excoriation- none Lymphadenopathy- none Head- atraumatic  Eyes- Gross vision intact, PERRLA, conjunctivae and secretions clear            Ears- Hearing, canals-normal            Nose- Clear, no-Septal dev, mucus, polyps, erosion, perforation             Throat- Mallampati II , mucosa clear , drainage- none, tonsils- atrophic Neck- flexible , trachea midline, no stridor , thyroid nl, carotid no bruit Chest - symmetrical excursion ,  unlabored           Heart/CV- RRR , no murmur , no gallop  , no rub, nl s1 s2                           - JVD+trace , edema+2-3 pitting, stasis changes- none, varices+ Left calf           Lung- +clear,  wheeze- none, cough- none , dullness-none, rub- none           Chest wall-  Abd-  Br/ Gen/ Rectal- Not done, not indicated Extrem- cyanosis- none, clubbing, none, atrophy- none, strength- nl Neuro- + head bob and hands tremor    Assessment & Plan:

## 2023-03-22 NOTE — Progress Notes (Signed)
Progress Note   Patient: Levi [name] OZH:[id number] DOB: [birth year] DOA: 03/19/2023     3 DOS: the patient was seen and examined on 03/22/2023   Brief hospital course: 78 y.o. with h/o PAF on Eliquis, COPD on chronic prednisone 20 mg daily, HTN, chronic lower extremity edema, and hx of lumbosacral fusion who is admitted with bacteremia due to [facility] in clusters that was identified on outpatient work-up.  He has long-standing venous insufficiency without recent wound care follow up (last in 08/2021) and noncompliance with compression.  Wound care consulted and recommends Medihoney with wraps.  Assessment and Plan:  MSSA Bacteremia -Outpatient blood culture collected 10/22 positive for MSSA -Has been on doxy and flagyl at home since 10/22 -He reports periodic fevers to 100.5 for several months -CT pelvis w/ contrast did not show drainable collection or soft tissue gas at gluteal region -He has significant hardware and hardware infection is considered but no frank pain other than R hip (with negative pelvic imaging other than R pubic rami fractures) -He did receive 3 doses of doxy + flagyl on 10/22-23 prior to admission -Changed from IV vancomycin to Cefazolin on 10/25 per ID -Follow blood cultures NTD from 10/23 -ID consulted -Echo done with EF 40-45%, grade 1 DD, no obvious vegetations -TEE planned for Monday 10/28 -MRI lumbar spine today, as his primary source of pain is his LS region (?coccygeal fracture from fall - that would be preferred over spinal infection) -Can have PICC line when ok with ID (Monday is soon enough since he will need TEE prior to dc regardless)   Venous insufficiency wounds -Wound care consulted -Very possible source of infection as both legs are erythematous, edematous, and with dry necrotic tissue per wound care -Wound to R lower leg needs medihoney    Paroxysmal atrial fibrillation -Stable, in sinus rhythm on admission   -Continue diltiazem and Eliquis    COPD -Stable on admission -Continue olodaterol, DuoNebs as needed, chronic prednisone 10 mg BID -He is immunocompromised due to daily prednisone and so at increased risk for opportunistic infections   Hyperlipidemia -Continue atorvastatin   Chronic neuropathy -Continue Cymbalta and Lyrica   BPH -Continue Flomax   CAD/CABG -Continue Imdur -No report of chest pain       Consultants: ID Wound Care Cardiology   Procedures: Echocardiogram 10/24 TEE 10/25   Antibiotics: Vancomycin 10/23-25 Metronidazole x 1 Cefepime x 1 Cefazolin 10/25-  30 Day Unplanned Readmission Risk Score    Flowsheet Row ED to Hosp-Admission (Current) from 03/19/2023 in [place] [facility]  30 Day Unplanned Readmission Risk Score (%) 20.39 Filed at 03/22/2023 0800       This score is the patient's risk of an unplanned readmission within 30 days of being discharged (0 -100%). The score is based on dignosis, age, lab data, medications, orders, and past utilization.   Low:  0-14.9   Medium: 15-21.9   High: 22-29.9   Extreme: 30 and above           Subjective: No new complaints today.  Still with severe lumbar-coccygeal back pain, mostly with minimal movement   Objective: Vitals:   03/22/23 0850 03/22/23 1329  BP:  (!) 122/58  Pulse:  74  Resp:  18  Temp:  98.3 F (36.8 C)  SpO2: 98% 96%    Intake/Output Summary (Last 24 hours) at 03/22/2023 1530 Last data filed at 03/22/2023 1025 Gross per 24 hour  Intake 300 ml  Output 850 ml  Net -550 ml   Filed Weights   03/19/23 2003  Weight: 90.3 kg    Exam:   General:  Appears calm and comfortable and is in NAD Eyes:  PERRL, EOMI, normal lids, iris ENT:  grossly normal hearing, lips & tongue, mmm Neck:  no LAD, masses or thyromegaly Cardiovascular:  RRR, with 3/6 systolic murmur. 1-2+ LE edema.  Respiratory:   CTA bilaterally with no wheezes/rales/rhonchi.  Normal respiratory effort. Abdomen:  soft, NT, ND Skin:  B  LE venous insufficiency with skin changes, erythema, edema; R lateral lower leg is covered, has a wound from running into someone with a walker Musculoskeletal:  grossly normal tone BUE/BLE, good ROM, no bony abnormality; Charcot foot on the right; no frank spinal deformities down to coccygeal region but he has most significant tenderness near the coccyx Psychiatric:  grossly normal mood and affect, speech fluent and appropriate, AOx3 Neurologic:  CN 2-12 grossly intact, moves all extremities in coordinated fashion  Data Reviewed: I have reviewed the patient's lab results since admission.  Pertinent labs for today include:  10/22 blood culture + for MSSA, resistant to erythromycin, PCN Repeat blood cultures from 10/23 are NTD x 3 days  Na++ 132 Glucose 126 WBC 11.1 Hgb 10.8     Family Communication: Wife was present throughout evaluation  Disposition: Status is: Inpatient Remains inpatient appropriate because: ongoing evaluation and management     Time spent: 50 minutes  Unresulted Labs (From admission, onward)     Start     Ordered   03/23/23 0500  CBC with Differential/Platelet  Tomorrow morning,   R       Question:  Specimen collection method  Answer:  Lab=Lab collect   03/22/23 1529   03/23/23 0500  Basic metabolic panel  Tomorrow morning,   R       Question:  Specimen collection method  Answer:  Lab=Lab collect   03/22/23 1529             Author: Jonah Blue, MD 03/22/2023 3:30 PM  For on call review [email or web address].

## 2023-03-23 ENCOUNTER — Other Ambulatory Visit: Payer: Self-pay

## 2023-03-23 ENCOUNTER — Encounter (HOSPITAL_COMMUNITY): Payer: Self-pay | Admitting: Internal Medicine

## 2023-03-23 DIAGNOSIS — R7881 Bacteremia: Secondary | ICD-10-CM | POA: Diagnosis not present

## 2023-03-23 DIAGNOSIS — B9561 Methicillin susceptible Staphylococcus aureus infection as the cause of diseases classified elsewhere: Secondary | ICD-10-CM

## 2023-03-23 LAB — BASIC METABOLIC PANEL
Anion gap: 8 (ref 5–15)
BUN: 12 mg/dL (ref 8–23)
CO2: 25 mmol/L (ref 22–32)
Calcium: 8.2 mg/dL — ABNORMAL LOW (ref 8.9–10.3)
Chloride: 104 mmol/L (ref 98–111)
Creatinine, Ser: 0.75 mg/dL (ref 0.61–1.24)
GFR, Estimated: >60 mL/min (ref >60)
Glucose, Bld: 133 mg/dL — ABNORMAL HIGH (ref 70–99)
Potassium: 3.2 mmol/L — ABNORMAL LOW (ref 3.5–5.1)
Sodium: 137 mmol/L (ref 135–145)

## 2023-03-23 LAB — CBC WITH DIFFERENTIAL/PLATELET
Abs Immature Granulocytes: 0.83 10*3/uL — ABNORMAL HIGH (ref 0.00–0.07)
Basophils Absolute: 0.1 10*3/uL (ref 0.0–0.1)
Basophils Relative: 1 %
Eosinophils Absolute: 0 10*3/uL (ref 0.0–0.5)
Eosinophils Relative: 0 %
HCT: 30.6 % — ABNORMAL LOW (ref 39.0–52.0)
Hemoglobin: 10 g/dL — ABNORMAL LOW (ref 13.0–17.0)
Immature Granulocytes: 9 %
Lymphocytes Relative: 8 %
Lymphs Abs: 0.7 10*3/uL (ref 0.7–4.0)
MCH: 29.4 pg (ref 26.0–34.0)
MCHC: 32.7 g/dL (ref 30.0–36.0)
MCV: 90 fL (ref 80.0–100.0)
Monocytes Absolute: 0.6 10*3/uL (ref 0.1–1.0)
Monocytes Relative: 7 %
Neutro Abs: 6.6 10*3/uL (ref 1.7–7.7)
Neutrophils Relative %: 75 %
Platelets: 188 10*3/uL (ref 150–400)
RBC: 3.4 MIL/uL — ABNORMAL LOW (ref 4.22–5.81)
RDW: 16.6 % — ABNORMAL HIGH (ref 11.5–15.5)
WBC: 8.8 10*3/uL (ref 4.0–10.5)
nRBC: 0 % (ref 0.0–0.2)

## 2023-03-23 MED ORDER — POTASSIUM CHLORIDE CRYS ER 20 MEQ PO TBCR
40.0000 meq | EXTENDED_RELEASE_TABLET | Freq: Once | ORAL | Status: AC
  Administered 2023-03-23: 40 meq via ORAL
  Filled 2023-03-23: qty 2

## 2023-03-23 NOTE — Progress Notes (Signed)
Progress Note   Patient: Levi [name] JYN:[id number] DOB: 04/18/1946 DOA: 03/19/2023     4 DOS: the patient was seen and examined on 03/23/2023   Brief hospital course: 77 y.o. with h/o PAF on Eliquis, COPD on chronic prednisone 20 mg daily, HTN, chronic lower extremity edema, and hx of lumbosacral fusion who is admitted with bacteremia due to [facility] in clusters that was identified on outpatient work-up.  He has long-standing venous insufficiency without recent wound care follow up (last in 08/2021) and noncompliance with compression.  Wound care consulted and recommends Medihoney with wraps.  Assessment and Plan:  MSSA Bacteremia -Outpatient blood culture collected 10/22 positive for MSSA -Has been on doxy and flagyl at home since 10/22 -He reports periodic fevers to 100.5 for several months -CT pelvis w/ contrast did not show drainable collection or soft tissue gas at gluteal region -He has significant hardware and hardware infection is considered but no frank pain other than R hip (with negative pelvic imaging other than R pubic rami fractures) -He did receive 3 doses of doxy + flagyl on 10/22-23 prior to admission -Changed from IV vancomycin to Cefazolin on 10/25 per ID -Follow blood cultures NTD from 10/23 -ID consulted -Echo done with EF 40-45%, grade 1 DD, no obvious vegetations -TEE planned for Monday 10/28 -MRI lumbar spine done, as his primary source of pain is his LS region - unremarkable -Can have PICC line when ok with ID (Monday is soon enough since he will need TEE prior to dc regardless) -Likely to dc to home 10/28 post-TEE if no new issues arise   Venous insufficiency wounds -Wound care consulted -Very possible source of infection as both legs are erythematous, edematous, and with dry necrotic tissue per wound care -Wound to R lower leg needs medihoney    Paroxysmal atrial fibrillation -Stable, in sinus rhythm on admission   -Continue diltiazem and Eliquis    COPD -Stable on admission -Continue olodaterol, DuoNebs as needed, chronic prednisone 10 mg BID -He is immunocompromised due to daily prednisone and so at increased risk for opportunistic infections   Hyperlipidemia -Continue atorvastatin   Chronic neuropathy -Continue Cymbalta and Lyrica   BPH -Continue Flomax   CAD/CABG -Continue Imdur -No report of chest pain       Consultants: ID Wound Care Cardiology   Procedures: Echocardiogram 10/24 TEE 10/25   Antibiotics: Vancomycin 10/23-25 Metronidazole x 1 Cefepime x 1 Cefazolin 10/25-    30 Day Unplanned Readmission Risk Score    Flowsheet Row ED to Hosp-Admission (Current) from 03/19/2023 in [place] [facility]  30 Day Unplanned Readmission Risk Score (%) 18.55 Filed at 03/23/2023 0401       This score is the patient's risk of an unplanned readmission within 30 days of being discharged (0 -100%). The score is based on dignosis, age, lab data, medications, orders, and past utilization.   Low:  0-14.9   Medium: 15-21.9   High: 22-29.9   Extreme: 30 and above           Subjective: Feeling about the same as usual.  Relieved that there is no significant pathology in his spine, but also disappointed to not have an answer.  He is scheduled for a pre-spinal cord stimulator appointment on 10/30 and really wants to go.   Objective: Vitals:   03/23/23 0802 03/23/23 1140  BP:  108/79  Pulse:  (!) 110  Resp:  16  Temp:  97.8 F (36.6 C)  SpO2: 95%  97%    Intake/Output Summary (Last 24 hours) at 03/23/2023 1428 Last data filed at 03/23/2023 4098 Gross per 24 hour  Intake 300 ml  Output 560 ml  Net -260 ml   Filed Weights   03/19/23 2003  Weight: 90.3 kg    Exam:  General:  Appears calm and comfortable and is in NAD, sitting up at bedside in NAD Eyes:  PERRL, EOMI, normal lids, iris ENT:  grossly normal hearing, lips & tongue, mmm Neck:  no LAD, masses or thyromegaly Cardiovascular:  RRR,  with 3/6 systolic murmur. 1-2+ LE edema.  Respiratory:   CTA bilaterally with no wheezes/rales/rhonchi.  Normal respiratory effort. Abdomen:  soft, NT, ND Skin:  B LE venous insufficiency with skin changes, erythema, edema; R lateral lower leg is covered, has a wound from running into someone with a walker Musculoskeletal:  grossly normal tone BUE/BLE, good ROM, no bony abnormality; Charcot foot on the right; no frank spinal deformities down to coccygeal region but he has most significant tenderness near the coccyx Psychiatric:  grossly normal mood and affect, speech fluent and appropriate, AOx3 Neurologic:  CN 2-12 grossly intact, moves all extremities in coordinated fashion  Data Reviewed: I have reviewed the patient's lab results since admission.  Pertinent labs for today include:   K+ 3.2 Glucose 133 WBC 8.8 Hgb 10     Family Communication: None present  Disposition: Status is: Inpatient Remains inpatient appropriate because: ongoing management     Time spent: 35 minutes  Unresulted Labs (From admission, onward)     Start     Ordered   03/24/23 0500  CBC with Differential/Platelet  Tomorrow morning,   R       Question:  Specimen collection method  Answer:  Lab=Lab collect   03/23/23 0801   03/24/23 0500  Basic metabolic panel  Tomorrow morning,   R       Question:  Specimen collection method  Answer:  Lab=Lab collect   03/23/23 0801             Author: Jonah Blue, MD 03/23/2023 2:28 PM  For on call review [email or web address].

## 2023-03-23 NOTE — Anesthesia Preprocedure Evaluation (Signed)
Anesthesia Evaluation  Patient identified by MRN, date of birth, ID band Patient awake    Reviewed: Allergy & Precautions, NPO status , Patient's Chart, lab work & pertinent test results, reviewed documented beta blocker date and time   Airway Mallampati: II  TM Distance: >3 FB     Dental  (+) Missing, Caps, Dental Advisory Given   Pulmonary shortness of breath and with exertion, asthma , pneumonia, resolved, COPD,  COPD inhaler   Pulmonary exam normal breath sounds clear to auscultation       Cardiovascular hypertension, Pt. on medications +CHF  + dysrhythmias Atrial Fibrillation  Rhythm:Regular Rate:Normal  EKG 03/20/23 ST, PAC's  Echo 03/20/23 1. Left ventricular ejection fraction, by estimation, is 40 to 45%. The  left ventricle has mildly decreased function. The left ventricle  demonstrates global hypokinesis. Left ventricular diastolic parameters are  consistent with Grade I diastolic  dysfunction (impaired relaxation).   2. Right ventricular systolic function is normal. The right ventricular  size is normal.   3. The mitral valve is normal in structure. Mild mitral valve  regurgitation. No evidence of mitral stenosis.   4. The aortic valve is tricuspid. There is mild calcification of the  aortic valve. Aortic valve regurgitation is mild. Mild aortic valve  stenosis. Aortic regurgitation PHT measures 332 msec. Aortic valve area,  by VTI measures 1.42 cm. Aortic valve mean   gradient measures 12.0 mmHg. Aortic valve Vmax measures 2.28 m/s.      Neuro/Psych  Headaches Peripheral neuropathy  Neuromuscular disease  negative psych ROS   GI/Hepatic Neg liver ROS, hiatal hernia,GERD  Medicated,,Diverticulosis    Endo/Other  Hypothyroidism  Hyperlipidemia Hyperuricemia  Renal/GU negative Renal ROS     Musculoskeletal  (+) Arthritis , Osteoarthritis,  Charcot's joint right foot S/P right hip arthroplasty    Abdominal   Peds  Hematology Bacteremia Eliquis therapy   Anesthesia Other Findings   Reproductive/Obstetrics                              Anesthesia Physical Anesthesia Plan  ASA: 3  Anesthesia Plan: MAC   Post-op Pain Management: Minimal or no pain anticipated   Induction: Intravenous  PONV Risk Score and Plan: 1 and Treatment may vary due to age or medical condition and Propofol infusion  Airway Management Planned: Natural Airway and Nasal Cannula  Additional Equipment: None  Intra-op Plan:   Post-operative Plan:   Informed Consent: I have reviewed the patients History and Physical, chart, labs and discussed the procedure including the risks, benefits and alternatives for the proposed anesthesia with the patient or authorized representative who has indicated his/her understanding and acceptance.     Dental advisory given  Plan Discussed with: Anesthesiologist and CRNA  Anesthesia Plan Comments:          Anesthesia Quick Evaluation

## 2023-03-23 NOTE — Plan of Care (Signed)

## 2023-03-24 ENCOUNTER — Other Ambulatory Visit (HOSPITAL_BASED_OUTPATIENT_CLINIC_OR_DEPARTMENT_OTHER): Payer: Medicare Other

## 2023-03-24 ENCOUNTER — Inpatient Hospital Stay (HOSPITAL_COMMUNITY): Payer: Medicare Other

## 2023-03-24 ENCOUNTER — Encounter (HOSPITAL_COMMUNITY)
Admission: EM | Disposition: A | Discharge status: Home / Self Care | Payer: Self-pay | Source: Home / Self Care | Attending: Internal Medicine

## 2023-03-24 ENCOUNTER — Inpatient Hospital Stay (HOSPITAL_COMMUNITY): Payer: Medicare Other | Admitting: Anesthesiology

## 2023-03-24 ENCOUNTER — Inpatient Hospital Stay (HOSPITAL_COMMUNITY): Payer: Self-pay | Admitting: Anesthesiology

## 2023-03-24 ENCOUNTER — Ambulatory Visit: Payer: Medicare Other | Admitting: Sports Medicine

## 2023-03-24 DIAGNOSIS — R7881 Bacteremia: Secondary | ICD-10-CM | POA: Diagnosis not present

## 2023-03-24 DIAGNOSIS — I340 Nonrheumatic mitral (valve) insufficiency: Secondary | ICD-10-CM | POA: Diagnosis not present

## 2023-03-24 DIAGNOSIS — B9561 Methicillin susceptible Staphylococcus aureus infection as the cause of diseases classified elsewhere: Secondary | ICD-10-CM | POA: Diagnosis not present

## 2023-03-24 HISTORY — PX: TRANSESOPHAGEAL ECHOCARDIOGRAM (CATH LAB): EP1270

## 2023-03-24 LAB — CBC WITH DIFFERENTIAL/PLATELET
Abs Immature Granulocytes: 1.34 10*3/uL — ABNORMAL HIGH (ref 0.00–0.07)
Basophils Absolute: 0 10*3/uL (ref 0.0–0.1)
Basophils Relative: 0 %
Eosinophils Absolute: 0 10*3/uL (ref 0.0–0.5)
Eosinophils Relative: 0 %
HCT: 31.7 % — ABNORMAL LOW (ref 39.0–52.0)
Hemoglobin: 10.4 g/dL — ABNORMAL LOW (ref 13.0–17.0)
Immature Granulocytes: 14 %
Lymphocytes Relative: 13 %
Lymphs Abs: 1.3 10*3/uL (ref 0.7–4.0)
MCH: 30.3 pg (ref 26.0–34.0)
MCHC: 32.8 g/dL (ref 30.0–36.0)
MCV: 92.4 fL (ref 80.0–100.0)
Monocytes Absolute: 0.7 10*3/uL (ref 0.1–1.0)
Monocytes Relative: 7 %
Neutro Abs: 6.6 10*3/uL (ref 1.7–7.7)
Neutrophils Relative %: 66 %
Platelets: 210 10*3/uL (ref 150–400)
RBC: 3.43 MIL/uL — ABNORMAL LOW (ref 4.22–5.81)
RDW: 16.7 % — ABNORMAL HIGH (ref 11.5–15.5)
Smear Review: NORMAL
WBC: 10 10*3/uL (ref 4.0–10.5)
nRBC: 0 % (ref 0.0–0.2)

## 2023-03-24 LAB — BASIC METABOLIC PANEL
Anion gap: 7 (ref 5–15)
BUN: 11 mg/dL (ref 8–23)
CO2: 26 mmol/L (ref 22–32)
Calcium: 8.4 mg/dL — ABNORMAL LOW (ref 8.9–10.3)
Chloride: 107 mmol/L (ref 98–111)
Creatinine, Ser: 0.76 mg/dL (ref 0.61–1.24)
GFR, Estimated: >60 mL/min (ref >60)
Glucose, Bld: 114 mg/dL — ABNORMAL HIGH (ref 70–99)
Potassium: 3.8 mmol/L (ref 3.5–5.1)
Sodium: 140 mmol/L (ref 135–145)

## 2023-03-24 LAB — CULTURE, BLOOD (ROUTINE X 2)
Culture: NO GROWTH
Culture: NO GROWTH
Special Requests: ADEQUATE
Special Requests: ADEQUATE

## 2023-03-24 LAB — ECHO TEE

## 2023-03-24 SURGERY — TRANSESOPHAGEAL ECHOCARDIOGRAM (TEE) (CATHLAB)
Anesthesia: Monitor Anesthesia Care

## 2023-03-24 MED ORDER — PROPOFOL 10 MG/ML IV BOLUS
INTRAVENOUS | Status: DC | PRN
  Administered 2023-03-24 (×2): 20 mg via INTRAVENOUS
  Administered 2023-03-24: 50 ug/kg/min via INTRAVENOUS
  Administered 2023-03-24: 30 mg via INTRAVENOUS
  Administered 2023-03-24 (×2): 20 mg via INTRAVENOUS

## 2023-03-24 MED ORDER — BUTAMBEN-TETRACAINE-BENZOCAINE 2-2-14 % EX AERO
INHALATION_SPRAY | CUTANEOUS | Status: DC | PRN
  Administered 2023-03-24: 1 via TOPICAL

## 2023-03-24 MED ORDER — SODIUM CHLORIDE 0.9 % IV SOLN: INTRAVENOUS | Status: DC | PRN

## 2023-03-24 MED ORDER — MEDIHONEY WOUND/BURN DRESSING EX PSTE: 1.0000 | PASTE | Freq: Every day | CUTANEOUS | 0 refills | Status: DC

## 2023-03-24 MED ORDER — LIDOCAINE 2% (20 MG/ML) 5 ML SYRINGE
INTRAMUSCULAR | Status: DC | PRN
  Administered 2023-03-24: 60 mg via INTRAVENOUS

## 2023-03-24 MED ORDER — CEFAZOLIN IV (FOR PTA / DISCHARGE USE ONLY): 2.0000 g | Freq: Three times a day (TID) | INTRAVENOUS | 0 refills | Status: AC

## 2023-03-24 NOTE — Progress Notes (Signed)
  Echocardiogram Echocardiogram Transesophageal has been performed.  Levi [name] 03/24/2023, 12:13 PM

## 2023-03-24 NOTE — Discharge Summary (Signed)
Physician Discharge Summary   Patient: Levi [name] MRN: [id number] DOB: 07-02-1945  Admit date:     03/19/2023  Discharge date: 03/24/23  Discharge Physician: Jonah Blue   PCP: Monica Becton, MD   Recommendations at discharge:   You are being treated for a bloodstream infection and will need antibiotics through 11/5; home health will assist Follow up with Dr. [name] in 1-2 weeks Continue to apply Medihoney to R leg wound daily  Discharge Diagnoses: Principal Problem:   MSSA bacteremia Active Problems:   Paroxysmal atrial fibrillation (HCC)   Hyperlipidemia   COPD (chronic obstructive pulmonary disease) (HCC)   Peripheral neuropathy   BPH (benign prostatic hyperplasia)   Venous insufficiency of both lower extremities    Hospital Course: 77 y.o. with h/o PAF on Eliquis, COPD on chronic prednisone 20 mg daily, HTN, chronic lower extremity edema, and hx of lumbosacral fusion who is admitted with bacteremia due to [facility] in clusters that was identified on outpatient work-up.  He has long-standing venous insufficiency without recent wound care follow up (last in 08/2021) and noncompliance with compression.  Wound care consulted and recommends Medihoney with wraps.  Assessment and Plan:   MSSA Bacteremia -Outpatient blood culture collected 10/22 positive for MSSA -Has been on doxy and flagyl at home since 10/22 -He reports periodic fevers to 100.5 for several months -CT pelvis w/ contrast did not show drainable collection or soft tissue gas at gluteal region -He has significant hardware and hardware infection is considered but no frank pain other than R hip (with negative pelvic imaging other than R pubic rami fractures) -He did receive 3 doses of doxy + flagyl on 10/22-23 prior to admission -Changed from IV vancomycin to Cefazolin on 10/25 per ID -Follow blood cultures NTD from 10/23 -ID consulted -Echo done with EF 40-45%, grade 1 DD, no obvious  vegetations -TEE 10/28, negative for vegetations -MRI lumbar spine done, as his primary source of pain is his LS region - unremarkable -Can have PICC or midline post-TEE -Dc to home 10/28 post-TEE after PICC line is placed   Venous insufficiency wounds -Wound care consulted -Very possible source of infection as both legs are erythematous, edematous, and with dry necrotic tissue per wound care -Wound to R lower leg needs medihoney    Paroxysmal atrial fibrillation -Stable, in sinus rhythm on admission   -Continue diltiazem and Eliquis   COPD -Stable on admission -Continue olodaterol, DuoNebs as needed, chronic prednisone 10 mg BID -He is immunocompromised due to daily prednisone and so at increased risk for opportunistic infections   Hyperlipidemia -Continue atorvastatin   Chronic neuropathy -Continue Cymbalta and Lyrica   BPH -Continue Flomax   CAD/CABG -Continue Imdur -No report of chest pain       Consultants: ID Wound Care Cardiology   Procedures: Echocardiogram 10/24 TEE 10/25   Antibiotics: Vancomycin 10/23-25 Metronidazole x 1 Cefepime x 1 Cefazolin 10/25-11/5    30 Day Unplanned Readmission Risk Score    Flowsheet Row ED to Hosp-Admission (Current) from 03/19/2023 in [place] [facility]  30 Day Unplanned Readmission Risk Score (%) 18.81 Filed at 03/24/2023 0401       This score is the patient's risk of an unplanned readmission within 30 days of being discharged (0 -100%). The score is based on dignosis, age, lab data, medications, orders, and past utilization.   Low:  0-14.9   Medium: 15-21.9   High: 22-29.9   Extreme: 30 and above  are present. The lowest fully formed vertebral body is L5. Alignment: Grade 1 anterolisthesis is present at L4-5. No other significant listhesis is present. Fusion is present across the disc spaces T12-S1. Vertebrae: Distortion of marrow signal is present secondary to hardware pedicle screws bilaterally at L1, L2, L3, L4 and S1. No edema or enhancement is present. Conus medullaris and cauda equina: Conus extends to the T12 level. Conus and cauda equina appear normal. Paraspinal and other soft tissues: A left renal central sinus cyst is noted. Kidneys are otherwise unremarkable. No significant adenopathy is present. Disc levels: L1-2: Solid fusion is present. No residual or recurrent stenosis is present. L2-3: Solid fusion is present. No residual or recurrent stenosis is present. L3-4: A broad-based disc protrusion scratched at solid fusion is present. No residual or recurrent stenosis is present. L4-5: Uncovering of residual disc material is present. Moderate facet hypertrophy is present bilaterally. Mild central and bilateral foraminal narrowing is present. L5-S1: Solid fusion is present. No residual or recurrent stenosis is present. Postcontrast images demonstrate no pathologic enhancement to suggest infection. IMPRESSION: 1. Solid fusion across the disc spaces T12-S1 without residual or recurrent stenosis. 2. Mild central and bilateral foraminal narrowing at L4-5 secondary to grade 1 anterolisthesis and moderate  facet hypertrophy. 3. No acute or focal lesion to explain the patient's symptoms. Electronically Signed   By: Marin Roberts M.D.   On: 03/22/2023 18:58   ECHOCARDIOGRAM COMPLETE  Result Date: 03/20/2023    ECHOCARDIOGRAM REPORT   Patient Name:   Levi [name] Date of Exam: 03/20/2023 Medical Rec #:  [id number]     Height:       74.0 in Accession #:    [id number]    Weight:       199.1 lb Date of Birth:  [birth year]     BSA:          2.169 m Patient Age:    77 years      BP:           120/71 mmHg Patient Gender: M             HR:           80 bpm. Exam Location:  Inpatient Procedure: 2D Echo, Cardiac Doppler and Color Doppler Indications:     Bacteremia R78.81  History:         Patient has prior history of Echocardiogram examinations, most                  recent 02/14/2021. CHF, COPD, Arrythmias:Atrial Fibrillation;                  Signs/Symptoms:Hypotension and Chest Pain.  Sonographer:     Lucendia Herrlich RCS Referring Phys:  [id number] Victorino Dike Lempi Edwin Diagnosing Phys: Arvilla Meres MD IMPRESSIONS  1. Left ventricular ejection fraction, by estimation, is 40 to 45%. The left ventricle has mildly decreased function. The left ventricle demonstrates global hypokinesis. Left ventricular diastolic parameters are consistent with Grade I diastolic dysfunction (impaired relaxation).  2. Right ventricular systolic function is normal. The right ventricular size is normal.  3. The mitral valve is normal in structure. Mild mitral valve regurgitation. No evidence of mitral stenosis.  4. The aortic valve is tricuspid. There is mild calcification of the aortic valve. Aortic valve regurgitation is mild. Mild aortic valve stenosis. Aortic regurgitation PHT measures 332 msec. Aortic valve area, by VTI measures 1.42 cm. Aortic valve mean  gradient measures 12.0 mmHg. Aortic valve Vmax measures  are present. The lowest fully formed vertebral body is L5. Alignment: Grade 1 anterolisthesis is present at L4-5. No other significant listhesis is present. Fusion is present across the disc spaces T12-S1. Vertebrae: Distortion of marrow signal is present secondary to hardware pedicle screws bilaterally at L1, L2, L3, L4 and S1. No edema or enhancement is present. Conus medullaris and cauda equina: Conus extends to the T12 level. Conus and cauda equina appear normal. Paraspinal and other soft tissues: A left renal central sinus cyst is noted. Kidneys are otherwise unremarkable. No significant adenopathy is present. Disc levels: L1-2: Solid fusion is present. No residual or recurrent stenosis is present. L2-3: Solid fusion is present. No residual or recurrent stenosis is present. L3-4: A broad-based disc protrusion scratched at solid fusion is present. No residual or recurrent stenosis is present. L4-5: Uncovering of residual disc material is present. Moderate facet hypertrophy is present bilaterally. Mild central and bilateral foraminal narrowing is present. L5-S1: Solid fusion is present. No residual or recurrent stenosis is present. Postcontrast images demonstrate no pathologic enhancement to suggest infection. IMPRESSION: 1. Solid fusion across the disc spaces T12-S1 without residual or recurrent stenosis. 2. Mild central and bilateral foraminal narrowing at L4-5 secondary to grade 1 anterolisthesis and moderate  facet hypertrophy. 3. No acute or focal lesion to explain the patient's symptoms. Electronically Signed   By: Marin Roberts M.D.   On: 03/22/2023 18:58   ECHOCARDIOGRAM COMPLETE  Result Date: 03/20/2023    ECHOCARDIOGRAM REPORT   Patient Name:   Levi [name] Date of Exam: 03/20/2023 Medical Rec #:  [id number]     Height:       74.0 in Accession #:    [id number]    Weight:       199.1 lb Date of Birth:  [birth year]     BSA:          2.169 m Patient Age:    77 years      BP:           120/71 mmHg Patient Gender: M             HR:           80 bpm. Exam Location:  Inpatient Procedure: 2D Echo, Cardiac Doppler and Color Doppler Indications:     Bacteremia R78.81  History:         Patient has prior history of Echocardiogram examinations, most                  recent 02/14/2021. CHF, COPD, Arrythmias:Atrial Fibrillation;                  Signs/Symptoms:Hypotension and Chest Pain.  Sonographer:     Lucendia Herrlich RCS Referring Phys:  [id number] Victorino Dike Lempi Edwin Diagnosing Phys: Arvilla Meres MD IMPRESSIONS  1. Left ventricular ejection fraction, by estimation, is 40 to 45%. The left ventricle has mildly decreased function. The left ventricle demonstrates global hypokinesis. Left ventricular diastolic parameters are consistent with Grade I diastolic dysfunction (impaired relaxation).  2. Right ventricular systolic function is normal. The right ventricular size is normal.  3. The mitral valve is normal in structure. Mild mitral valve regurgitation. No evidence of mitral stenosis.  4. The aortic valve is tricuspid. There is mild calcification of the aortic valve. Aortic valve regurgitation is mild. Mild aortic valve stenosis. Aortic regurgitation PHT measures 332 msec. Aortic valve area, by VTI measures 1.42 cm. Aortic valve mean  gradient measures 12.0 mmHg. Aortic valve Vmax measures  are present. The lowest fully formed vertebral body is L5. Alignment: Grade 1 anterolisthesis is present at L4-5. No other significant listhesis is present. Fusion is present across the disc spaces T12-S1. Vertebrae: Distortion of marrow signal is present secondary to hardware pedicle screws bilaterally at L1, L2, L3, L4 and S1. No edema or enhancement is present. Conus medullaris and cauda equina: Conus extends to the T12 level. Conus and cauda equina appear normal. Paraspinal and other soft tissues: A left renal central sinus cyst is noted. Kidneys are otherwise unremarkable. No significant adenopathy is present. Disc levels: L1-2: Solid fusion is present. No residual or recurrent stenosis is present. L2-3: Solid fusion is present. No residual or recurrent stenosis is present. L3-4: A broad-based disc protrusion scratched at solid fusion is present. No residual or recurrent stenosis is present. L4-5: Uncovering of residual disc material is present. Moderate facet hypertrophy is present bilaterally. Mild central and bilateral foraminal narrowing is present. L5-S1: Solid fusion is present. No residual or recurrent stenosis is present. Postcontrast images demonstrate no pathologic enhancement to suggest infection. IMPRESSION: 1. Solid fusion across the disc spaces T12-S1 without residual or recurrent stenosis. 2. Mild central and bilateral foraminal narrowing at L4-5 secondary to grade 1 anterolisthesis and moderate  facet hypertrophy. 3. No acute or focal lesion to explain the patient's symptoms. Electronically Signed   By: Marin Roberts M.D.   On: 03/22/2023 18:58   ECHOCARDIOGRAM COMPLETE  Result Date: 03/20/2023    ECHOCARDIOGRAM REPORT   Patient Name:   Levi [name] Date of Exam: 03/20/2023 Medical Rec #:  [id number]     Height:       74.0 in Accession #:    [id number]    Weight:       199.1 lb Date of Birth:  [birth year]     BSA:          2.169 m Patient Age:    77 years      BP:           120/71 mmHg Patient Gender: M             HR:           80 bpm. Exam Location:  Inpatient Procedure: 2D Echo, Cardiac Doppler and Color Doppler Indications:     Bacteremia R78.81  History:         Patient has prior history of Echocardiogram examinations, most                  recent 02/14/2021. CHF, COPD, Arrythmias:Atrial Fibrillation;                  Signs/Symptoms:Hypotension and Chest Pain.  Sonographer:     Lucendia Herrlich RCS Referring Phys:  [id number] Victorino Dike Lempi Edwin Diagnosing Phys: Arvilla Meres MD IMPRESSIONS  1. Left ventricular ejection fraction, by estimation, is 40 to 45%. The left ventricle has mildly decreased function. The left ventricle demonstrates global hypokinesis. Left ventricular diastolic parameters are consistent with Grade I diastolic dysfunction (impaired relaxation).  2. Right ventricular systolic function is normal. The right ventricular size is normal.  3. The mitral valve is normal in structure. Mild mitral valve regurgitation. No evidence of mitral stenosis.  4. The aortic valve is tricuspid. There is mild calcification of the aortic valve. Aortic valve regurgitation is mild. Mild aortic valve stenosis. Aortic regurgitation PHT measures 332 msec. Aortic valve area, by VTI measures 1.42 cm. Aortic valve mean  gradient measures 12.0 mmHg. Aortic valve Vmax measures  Physician Discharge Summary   Patient: Levi [name] MRN: [id number] DOB: 07-02-1945  Admit date:     03/19/2023  Discharge date: 03/24/23  Discharge Physician: Jonah Blue   PCP: Monica Becton, MD   Recommendations at discharge:   You are being treated for a bloodstream infection and will need antibiotics through 11/5; home health will assist Follow up with Dr. [name] in 1-2 weeks Continue to apply Medihoney to R leg wound daily  Discharge Diagnoses: Principal Problem:   MSSA bacteremia Active Problems:   Paroxysmal atrial fibrillation (HCC)   Hyperlipidemia   COPD (chronic obstructive pulmonary disease) (HCC)   Peripheral neuropathy   BPH (benign prostatic hyperplasia)   Venous insufficiency of both lower extremities    Hospital Course: 77 y.o. with h/o PAF on Eliquis, COPD on chronic prednisone 20 mg daily, HTN, chronic lower extremity edema, and hx of lumbosacral fusion who is admitted with bacteremia due to [facility] in clusters that was identified on outpatient work-up.  He has long-standing venous insufficiency without recent wound care follow up (last in 08/2021) and noncompliance with compression.  Wound care consulted and recommends Medihoney with wraps.  Assessment and Plan:   MSSA Bacteremia -Outpatient blood culture collected 10/22 positive for MSSA -Has been on doxy and flagyl at home since 10/22 -He reports periodic fevers to 100.5 for several months -CT pelvis w/ contrast did not show drainable collection or soft tissue gas at gluteal region -He has significant hardware and hardware infection is considered but no frank pain other than R hip (with negative pelvic imaging other than R pubic rami fractures) -He did receive 3 doses of doxy + flagyl on 10/22-23 prior to admission -Changed from IV vancomycin to Cefazolin on 10/25 per ID -Follow blood cultures NTD from 10/23 -ID consulted -Echo done with EF 40-45%, grade 1 DD, no obvious  vegetations -TEE 10/28, negative for vegetations -MRI lumbar spine done, as his primary source of pain is his LS region - unremarkable -Can have PICC or midline post-TEE -Dc to home 10/28 post-TEE after PICC line is placed   Venous insufficiency wounds -Wound care consulted -Very possible source of infection as both legs are erythematous, edematous, and with dry necrotic tissue per wound care -Wound to R lower leg needs medihoney    Paroxysmal atrial fibrillation -Stable, in sinus rhythm on admission   -Continue diltiazem and Eliquis   COPD -Stable on admission -Continue olodaterol, DuoNebs as needed, chronic prednisone 10 mg BID -He is immunocompromised due to daily prednisone and so at increased risk for opportunistic infections   Hyperlipidemia -Continue atorvastatin   Chronic neuropathy -Continue Cymbalta and Lyrica   BPH -Continue Flomax   CAD/CABG -Continue Imdur -No report of chest pain       Consultants: ID Wound Care Cardiology   Procedures: Echocardiogram 10/24 TEE 10/25   Antibiotics: Vancomycin 10/23-25 Metronidazole x 1 Cefepime x 1 Cefazolin 10/25-11/5    30 Day Unplanned Readmission Risk Score    Flowsheet Row ED to Hosp-Admission (Current) from 03/19/2023 in [place] [facility]  30 Day Unplanned Readmission Risk Score (%) 18.81 Filed at 03/24/2023 0401       This score is the patient's risk of an unplanned readmission within 30 days of being discharged (0 -100%). The score is based on dignosis, age, lab data, medications, orders, and past utilization.   Low:  0-14.9   Medium: 15-21.9   High: 22-29.9   Extreme: 30 and above  are present. The lowest fully formed vertebral body is L5. Alignment: Grade 1 anterolisthesis is present at L4-5. No other significant listhesis is present. Fusion is present across the disc spaces T12-S1. Vertebrae: Distortion of marrow signal is present secondary to hardware pedicle screws bilaterally at L1, L2, L3, L4 and S1. No edema or enhancement is present. Conus medullaris and cauda equina: Conus extends to the T12 level. Conus and cauda equina appear normal. Paraspinal and other soft tissues: A left renal central sinus cyst is noted. Kidneys are otherwise unremarkable. No significant adenopathy is present. Disc levels: L1-2: Solid fusion is present. No residual or recurrent stenosis is present. L2-3: Solid fusion is present. No residual or recurrent stenosis is present. L3-4: A broad-based disc protrusion scratched at solid fusion is present. No residual or recurrent stenosis is present. L4-5: Uncovering of residual disc material is present. Moderate facet hypertrophy is present bilaterally. Mild central and bilateral foraminal narrowing is present. L5-S1: Solid fusion is present. No residual or recurrent stenosis is present. Postcontrast images demonstrate no pathologic enhancement to suggest infection. IMPRESSION: 1. Solid fusion across the disc spaces T12-S1 without residual or recurrent stenosis. 2. Mild central and bilateral foraminal narrowing at L4-5 secondary to grade 1 anterolisthesis and moderate  facet hypertrophy. 3. No acute or focal lesion to explain the patient's symptoms. Electronically Signed   By: Marin Roberts M.D.   On: 03/22/2023 18:58   ECHOCARDIOGRAM COMPLETE  Result Date: 03/20/2023    ECHOCARDIOGRAM REPORT   Patient Name:   Levi [name] Date of Exam: 03/20/2023 Medical Rec #:  [id number]     Height:       74.0 in Accession #:    [id number]    Weight:       199.1 lb Date of Birth:  [birth year]     BSA:          2.169 m Patient Age:    77 years      BP:           120/71 mmHg Patient Gender: M             HR:           80 bpm. Exam Location:  Inpatient Procedure: 2D Echo, Cardiac Doppler and Color Doppler Indications:     Bacteremia R78.81  History:         Patient has prior history of Echocardiogram examinations, most                  recent 02/14/2021. CHF, COPD, Arrythmias:Atrial Fibrillation;                  Signs/Symptoms:Hypotension and Chest Pain.  Sonographer:     Lucendia Herrlich RCS Referring Phys:  [id number] Victorino Dike Lempi Edwin Diagnosing Phys: Arvilla Meres MD IMPRESSIONS  1. Left ventricular ejection fraction, by estimation, is 40 to 45%. The left ventricle has mildly decreased function. The left ventricle demonstrates global hypokinesis. Left ventricular diastolic parameters are consistent with Grade I diastolic dysfunction (impaired relaxation).  2. Right ventricular systolic function is normal. The right ventricular size is normal.  3. The mitral valve is normal in structure. Mild mitral valve regurgitation. No evidence of mitral stenosis.  4. The aortic valve is tricuspid. There is mild calcification of the aortic valve. Aortic valve regurgitation is mild. Mild aortic valve stenosis. Aortic regurgitation PHT measures 332 msec. Aortic valve area, by VTI measures 1.42 cm. Aortic valve mean  gradient measures 12.0 mmHg. Aortic valve Vmax measures  are present. The lowest fully formed vertebral body is L5. Alignment: Grade 1 anterolisthesis is present at L4-5. No other significant listhesis is present. Fusion is present across the disc spaces T12-S1. Vertebrae: Distortion of marrow signal is present secondary to hardware pedicle screws bilaterally at L1, L2, L3, L4 and S1. No edema or enhancement is present. Conus medullaris and cauda equina: Conus extends to the T12 level. Conus and cauda equina appear normal. Paraspinal and other soft tissues: A left renal central sinus cyst is noted. Kidneys are otherwise unremarkable. No significant adenopathy is present. Disc levels: L1-2: Solid fusion is present. No residual or recurrent stenosis is present. L2-3: Solid fusion is present. No residual or recurrent stenosis is present. L3-4: A broad-based disc protrusion scratched at solid fusion is present. No residual or recurrent stenosis is present. L4-5: Uncovering of residual disc material is present. Moderate facet hypertrophy is present bilaterally. Mild central and bilateral foraminal narrowing is present. L5-S1: Solid fusion is present. No residual or recurrent stenosis is present. Postcontrast images demonstrate no pathologic enhancement to suggest infection. IMPRESSION: 1. Solid fusion across the disc spaces T12-S1 without residual or recurrent stenosis. 2. Mild central and bilateral foraminal narrowing at L4-5 secondary to grade 1 anterolisthesis and moderate  facet hypertrophy. 3. No acute or focal lesion to explain the patient's symptoms. Electronically Signed   By: Marin Roberts M.D.   On: 03/22/2023 18:58   ECHOCARDIOGRAM COMPLETE  Result Date: 03/20/2023    ECHOCARDIOGRAM REPORT   Patient Name:   Levi [name] Date of Exam: 03/20/2023 Medical Rec #:  [id number]     Height:       74.0 in Accession #:    [id number]    Weight:       199.1 lb Date of Birth:  [birth year]     BSA:          2.169 m Patient Age:    77 years      BP:           120/71 mmHg Patient Gender: M             HR:           80 bpm. Exam Location:  Inpatient Procedure: 2D Echo, Cardiac Doppler and Color Doppler Indications:     Bacteremia R78.81  History:         Patient has prior history of Echocardiogram examinations, most                  recent 02/14/2021. CHF, COPD, Arrythmias:Atrial Fibrillation;                  Signs/Symptoms:Hypotension and Chest Pain.  Sonographer:     Lucendia Herrlich RCS Referring Phys:  [id number] Victorino Dike Lempi Edwin Diagnosing Phys: Arvilla Meres MD IMPRESSIONS  1. Left ventricular ejection fraction, by estimation, is 40 to 45%. The left ventricle has mildly decreased function. The left ventricle demonstrates global hypokinesis. Left ventricular diastolic parameters are consistent with Grade I diastolic dysfunction (impaired relaxation).  2. Right ventricular systolic function is normal. The right ventricular size is normal.  3. The mitral valve is normal in structure. Mild mitral valve regurgitation. No evidence of mitral stenosis.  4. The aortic valve is tricuspid. There is mild calcification of the aortic valve. Aortic valve regurgitation is mild. Mild aortic valve stenosis. Aortic regurgitation PHT measures 332 msec. Aortic valve area, by VTI measures 1.42 cm. Aortic valve mean  gradient measures 12.0 mmHg. Aortic valve Vmax measures  Physician Discharge Summary   Patient: Levi [name] MRN: [id number] DOB: 07-02-1945  Admit date:     03/19/2023  Discharge date: 03/24/23  Discharge Physician: Jonah Blue   PCP: Monica Becton, MD   Recommendations at discharge:   You are being treated for a bloodstream infection and will need antibiotics through 11/5; home health will assist Follow up with Dr. [name] in 1-2 weeks Continue to apply Medihoney to R leg wound daily  Discharge Diagnoses: Principal Problem:   MSSA bacteremia Active Problems:   Paroxysmal atrial fibrillation (HCC)   Hyperlipidemia   COPD (chronic obstructive pulmonary disease) (HCC)   Peripheral neuropathy   BPH (benign prostatic hyperplasia)   Venous insufficiency of both lower extremities    Hospital Course: 77 y.o. with h/o PAF on Eliquis, COPD on chronic prednisone 20 mg daily, HTN, chronic lower extremity edema, and hx of lumbosacral fusion who is admitted with bacteremia due to [facility] in clusters that was identified on outpatient work-up.  He has long-standing venous insufficiency without recent wound care follow up (last in 08/2021) and noncompliance with compression.  Wound care consulted and recommends Medihoney with wraps.  Assessment and Plan:   MSSA Bacteremia -Outpatient blood culture collected 10/22 positive for MSSA -Has been on doxy and flagyl at home since 10/22 -He reports periodic fevers to 100.5 for several months -CT pelvis w/ contrast did not show drainable collection or soft tissue gas at gluteal region -He has significant hardware and hardware infection is considered but no frank pain other than R hip (with negative pelvic imaging other than R pubic rami fractures) -He did receive 3 doses of doxy + flagyl on 10/22-23 prior to admission -Changed from IV vancomycin to Cefazolin on 10/25 per ID -Follow blood cultures NTD from 10/23 -ID consulted -Echo done with EF 40-45%, grade 1 DD, no obvious  vegetations -TEE 10/28, negative for vegetations -MRI lumbar spine done, as his primary source of pain is his LS region - unremarkable -Can have PICC or midline post-TEE -Dc to home 10/28 post-TEE after PICC line is placed   Venous insufficiency wounds -Wound care consulted -Very possible source of infection as both legs are erythematous, edematous, and with dry necrotic tissue per wound care -Wound to R lower leg needs medihoney    Paroxysmal atrial fibrillation -Stable, in sinus rhythm on admission   -Continue diltiazem and Eliquis   COPD -Stable on admission -Continue olodaterol, DuoNebs as needed, chronic prednisone 10 mg BID -He is immunocompromised due to daily prednisone and so at increased risk for opportunistic infections   Hyperlipidemia -Continue atorvastatin   Chronic neuropathy -Continue Cymbalta and Lyrica   BPH -Continue Flomax   CAD/CABG -Continue Imdur -No report of chest pain       Consultants: ID Wound Care Cardiology   Procedures: Echocardiogram 10/24 TEE 10/25   Antibiotics: Vancomycin 10/23-25 Metronidazole x 1 Cefepime x 1 Cefazolin 10/25-11/5    30 Day Unplanned Readmission Risk Score    Flowsheet Row ED to Hosp-Admission (Current) from 03/19/2023 in [place] [facility]  30 Day Unplanned Readmission Risk Score (%) 18.81 Filed at 03/24/2023 0401       This score is the patient's risk of an unplanned readmission within 30 days of being discharged (0 -100%). The score is based on dignosis, age, lab data, medications, orders, and past utilization.   Low:  0-14.9   Medium: 15-21.9   High: 22-29.9   Extreme: 30 and above

## 2023-03-24 NOTE — Care Management Important Message (Signed)
Important Message  Patient Details  Name: Levi [name] MRN: [id number] Date of Birth: 01/11/1946   Important Message Given:  Yes - Medicare IM     Dorena Bodo 03/24/2023, 3:10 PM

## 2023-03-24 NOTE — Progress Notes (Deleted)
OPAT ORDERS:  Diagnosis: MSSA bacteremia  Culture Result: blood CX + 10/22 MSSA  Allergies  Allergen Reactions   Levaquin [Levofloxacin] Itching     Discharge antibiotics to be given via PICC line:  Per pharmacy protocol cefazolin 2gm q8h   Duration: 2 weeks End Date: 11/5  [facility] Care Per Protocol with [employer] Use: Home health RN for IV administration and teaching, line care and labs.    Labs weekly while on IV antibiotics: _x_ CBC with differential __ BMP **TWICE WEEKLY ON VANCOMYCIN  _x_ CMP _x_ CRP _x_ ESR __ Vancomycin trough TWICE WEEKLY __ CK  __ Please pull PIC at completion of IV antibiotics _x_ Please leave PIC in place until doctor has seen patient or been notified  Fax weekly labs to [phone]  Clinic Follow Up Appt: 11/4  @ [facility] with Marcos Eke, NP

## 2023-03-24 NOTE — Anesthesia Postprocedure Evaluation (Signed)
Anesthesia Post Note  Patient: MOUSTAFA GLADD  Procedure(s) Performed: TRANSESOPHAGEAL ECHOCARDIOGRAM     Patient location during evaluation: PACU Anesthesia Type: MAC Level of consciousness: awake and alert and oriented Pain management: pain level controlled Vital Signs Assessment: post-procedure vital signs reviewed and stable Respiratory status: spontaneous breathing, nonlabored ventilation and respiratory function stable Cardiovascular status: stable and blood pressure returned to baseline Postop Assessment: no apparent nausea or vomiting Anesthetic complications: no   No notable events documented.  Last Vitals:  Vitals:   03/24/23 0822 03/24/23 1211  BP: (!) 152/95   Pulse: 75   Resp: 16   Temp: 36.4 C 36.5 C  SpO2: 96%     Last Pain:  Vitals:   03/24/23 1211  TempSrc: Temporal  PainSc: 0-No pain                 Julianna Vanwagner A.

## 2023-03-24 NOTE — Transfer of Care (Signed)
Immediate Anesthesia Transfer of Care Note  Patient: Levi [name]  Procedure(s) Performed: TRANSESOPHAGEAL ECHOCARDIOGRAM  Patient Location: Cath Lab  Anesthesia Type:MAC  Level of Consciousness: drowsy  Airway & Oxygen Therapy: Patient Spontanous Breathing and Patient connected to nasal cannula oxygen  Post-op Assessment: Report given to RN and Post -op Vital signs reviewed and stable  Post vital signs: Reviewed and stable, see cath lab post op VS flowsheet  Last Vitals:  Vitals Value Taken Time  BP    Temp    Pulse    Resp    SpO2      Last Pain:  Vitals:   03/24/23 0822  TempSrc: Temporal  PainSc:          Complications: No notable events documented.

## 2023-03-24 NOTE — Progress Notes (Addendum)
Transition of Care [facility]) - Inpatient Brief Assessment   Patient Details  Name: Levi [name] MRN: [id number] Date of Birth: 1946/03/23  Transition of Care [facility]) CM/SW Contact:    Janae Bridgeman, RN Phone Number: 03/24/2023, 10:40 AM   Clinical Narrative: Cm attempted to meet with the patient at the bedside but patient was away from the bedside for TEE planned for today.  No family present in the room at this time.  I called Jeri Modena, RNCM with [employer] and placed referral to follow the patient for needed IV antibiotics.  03/24/2023 1348 - CM met with the patient and wife at the bedside to discuss TOC needs after patient returned from his TEE today.  The patient has a PIV at this time and will need a PICC line placement.  The patient was offered Medicare choice regarding home health services for RN and the patient preferred [facility] since he has had them for services in the past.  [facility] order for RN was placed.  The PICC line RN visited at the bedside to have the patient/wife provide consent for PICC line placement.  CM will continue to follow the patient for TOC needs.  [employer] is following the patient at this time for home IV antibiotics and will follow up with the family for teaching once PICC line is placed - likely discharge to home today.  03/24/2023 1544- [facility] will plan to reach out to the patient for first visit at the home tomorrow.  Jeri Modena, RNCM with [employer] provided teaching at the bedside.  Right arm midline is in place for home.  I spoke with the patient and wife at the bedside and bedside nursing will provide discharge teaching and will send the patient home by car.  Patient's wife is aware that antibiotics will arrive to the house this evening and wife plans to give tonight's dose at 8-9 pm tonight as instructed.   Transition of Care Asessment: Insurance and Status: (P) Insurance coverage has been reviewed Patient has primary care physician: (P) Yes Home  environment has been reviewed: (P) From home with spouse Prior level of function:: (P) Independent Prior/Current Home Services: (P) No current home services Social Determinants of Health Reivew: (P) SDOH reviewed needs interventions Readmission risk has been reviewed: (P) Yes Transition of care needs: (P) transition of care needs identified, TOC will continue to follow

## 2023-03-24 NOTE — CV Procedure (Signed)
TRANSESOPHAGEAL ECHOCARDIOGRAM (TEE) NOTE  INDICATIONS: infective endocarditis  PROCEDURE:   Informed consent was obtained prior to the procedure. The risks, benefits and alternatives for the procedure were discussed and the patient comprehended these risks.  Risks include, but are not limited to, cough, sore throat, vomiting, nausea, somnolence, esophageal and stomach trauma or perforation, bleeding, low blood pressure, aspiration, pneumonia, infection, trauma to the teeth and death.    After a procedural time-out, the patient was given propofol for sedation by anesthesia. See their separate report.  The patient's heart rate, blood pressure, and oxygen saturation are monitored continuously during the procedure.The oropharynx was anesthetized with topical cetacaine.  The transesophageal probe was inserted in the esophagus and stomach without difficulty and multiple views were obtained.  The patient was kept under observation until the patient left the procedure room.  I was present face-to-face 100% of this time. The patient left the procedure room in stable condition.   Agitated microbubble saline contrast was administered.  COMPLICATIONS:    There were no immediate complications.  Findings:  LEFT VENTRICLE: The left ventricular wall thickness is normal.  The left ventricular cavity is mildly dilated in size. Wall motion is globally hypokinetic.  LVEF is 40-45%. Gastric views could not be performed due to anatomy.  RIGHT VENTRICLE:  The right ventricle is normal in structure and function without any thrombus or masses.    LEFT ATRIUM:  The left atrium is normal in size without any thrombus or masses.  There is not spontaneous echo contrast ("smoke") in the left atrium consistent with a low flow state.  LEFT ATRIAL APPENDAGE:  The left atrial appendage is free of any thrombus or masses. The appendage has single lobes. Pulse doppler indicates moderate flow in the appendage.  ATRIAL  SEPTUM:  The atrial septum is aneurysmal.  There is no clear evidence for interatrial shunting by color doppler and saline microbubble (although small PFO previously reported)  RIGHT ATRIUM:  The right atrium is normal in size and function without any thrombus or masses. Prominent eustachian valve.  MITRAL VALVE:  The mitral valve demonstrates some mild thickening of the posterior leaflet without obvious vegetation - appears unchanged compared to TEE in 2022. There is  Mild regurgitation.  There were no vegetations or stenosis.  AORTIC VALVE:  The aortic valve is trileaflet, normal in structure and function with  trivial  central regurgitation.  There were no vegetations or stenosis  TRICUSPID VALVE:  The tricuspid valve is normal in structure and function with Mild regurgitation.  There were no vegetations or stenosis   PULMONIC VALVE:  The pulmonic valve is normal in structure and function with  trivial  regurgitation.  There were no vegetations or stenosis.   AORTIC ARCH, ASCENDING AND DESCENDING AORTA:  There was grade 1 Myrtis Ser et. Al, 1992) atherosclerosis of the aortic arch and proximal descending aorta.  12. PULMONARY VEINS: Anomalous pulmonary venous return was not noted.  13. PERICARDIUM: The pericardium appeared normal and non-thickened.  There is no pericardial effusion.  IMPRESSION:   No obvious endocarditis No LAA thrombus Aneurysmal interatrial septum without PFO Mild MR Trivial AI with normal appearing leaflets LVEF 40-45% with global hypokinesis  RECOMMENDATIONS:    No obvious valvular vegetation.  Time Spent Directly with the Patient:  60 minutes   Chrystie Nose, MD, [facility], FACP  [place]  [facility]  Medical Director of the [facility] Lipid Disorders &  Cardiovascular Risk Reduction Clinic Diplomate of the American  Board of Clinical Lipidology Attending Cardiologist  Direct Dial: [phone]  Fax: [phone]  Website:   [email or web address] Nicely Korene Dula 03/24/2023, 12:06 PM

## 2023-03-24 NOTE — Progress Notes (Signed)
PHARMACY CONSULT NOTE FOR:  OUTPATIENT  PARENTERAL ANTIBIOTIC THERAPY (OPAT)  Indication: MSSA bacteremia Regimen: Cefazolin 2 g IV q8h End date: 04/01/23  IV antibiotic discharge orders are pended. To discharging provider:  please sign these orders via discharge navigator,  Select New Orders & click on the button choice - Manage This Unsigned Work.     Thank you for allowing pharmacy to be a part of this patient's care.  Lora Paula, PharmD PGY-2 Infectious Diseases Pharmacy Resident 03/24/2023 1:51 PM

## 2023-03-24 NOTE — Progress Notes (Signed)
Spoke with Dr. Ophelia Charter via telephone regarding patient need for PICC versus midline with therapy now changed to end 11/5.  Dr. Ophelia Charter states that either is fine.  Verified with Pam with [employer] that patient is okay to discharge with midline for prescribed therapy.  Patient and wife aware of this change and agreeable.  Pam. RN at bedside for teaching.

## 2023-03-24 NOTE — Progress Notes (Signed)
[facility]  Date of Admission:  03/19/2023    Total days of antibiotics 6         ASSESSMENT: This is a 77 year old male with past medical history of paroxysmal A-fib on Eliquis, COPD on chronic prednisone suppression 10 mg twice daily, hypertension, lower extremity edema, history lumbosacral fusion noted to have low back pain after fall following running after his dogs.  He was noted to have MSSA bacteremia from 10/22 blood cultures. Repeat cultures 10/23 so far no growth x2. MRI lumbar spine negative for acute infection. Plan for TEE today in the setting of MSSA bacteremia 2/2 unclear source. Continue current dose of cefazolin. Otherwise, remaining medical and supportive care per internal medicine.      PLAN: Continue current dose of cefazolin Plan for TEE today in the setting of MSSA bacteremia Remaining medical and supportive care per the primary team.  Principal Problem:   MSSA bacteremia Active Problems:   Paroxysmal atrial fibrillation (HCC)   Hyperlipidemia   COPD (chronic obstructive pulmonary disease) (HCC)   Peripheral neuropathy   BPH (benign prostatic hyperplasia)   Venous insufficiency of both lower extremities   Scheduled Meds:  allopurinol  100 mg Oral q AM   apixaban  5 mg Oral BID   arformoterol  15 mcg Nebulization BID   atorvastatin  20 mg Oral Daily   cyclobenzaprine  10 mg Oral QHS   diltiazem  180 mg Oral Daily   DULoxetine  30 mg Oral Daily   isosorbide mononitrate  30 mg Oral Daily   leptospermum manuka honey  1 Application Topical Daily   pantoprazole  40 mg Oral BID AC   predniSONE  10 mg Oral BID   pregabalin  75 mg Oral BID   sodium chloride flush  3 mL Intravenous Q12H   tamsulosin  0.4 mg Oral QHS   Continuous Infusions:   ceFAZolin (ANCEF) IV Stopped (03/24/23 0630)   PRN Meds:.acetaminophen **OR** acetaminophen, albuterol, diltiazem, HYDROcodone-acetaminophen, ipratropium-albuterol, morphine  injection, ondansetron **OR** ondansetron (ZOFRAN) IV, senna-docusate   SUBJECTIVE: Afebrile, no overnight events, no acute distress.  No other concerns at this time.  Review of Systems: Review of Systems  Constitutional:  Negative for chills and fever.  Respiratory:  Negative for shortness of breath.   Cardiovascular:  Negative for chest pain.    Allergies  Allergen Reactions   Levaquin [Levofloxacin] Itching    OBJECTIVE: Vitals:   03/24/23 0510 03/24/23 0730 03/24/23 0752 03/24/23 0822  BP: (!) 154/83  (!) 151/80 (!) 152/95  Pulse: 75 72 70 75  Resp: 18 17 18 16   Temp: 97.6 F (36.4 C)  97.6 F (36.4 C) 97.6 F (36.4 C)  TempSrc: Oral  Oral Temporal  SpO2: 97% 98% 96% 96%  Weight:      Height:       Body mass index is 25.56 kg/m.  Physical Exam  General: Alert,  no acute distress CV: Regular late, no murmur Pulm: Lungs are clear to auscultation  Lab Results Lab Results  Component Value Date   WBC 10.0 03/24/2023   HGB 10.4 (L) 03/24/2023   HCT 31.7 (L) 03/24/2023   MCV 92.4 03/24/2023   PLT 210 03/24/2023    Lab Results  Component Value Date   CREATININE 0.76 03/24/2023   BUN 11 03/24/2023   NA 140 03/24/2023   K 3.8 03/24/2023   CL 107 03/24/2023   CO2 26 03/24/2023  Lab Results  Component Value Date   ALT 23 03/19/2023   AST 34 03/19/2023   ALKPHOS 103 03/19/2023   BILITOT 1.1 03/19/2023     Microbiology: Recent Results (from the past 240 hour(s))  Microscopic Examination     Status: None   Collection Time: 03/18/23  2:11 PM  Result Value Ref Range Status   WBC, UA None seen 0 - 5 /hpf Final   RBC, Urine None seen 0 - 2 /hpf Final   Epithelial Cells (non renal) None seen 0 - 10 /hpf Final   Casts None seen None seen /lpf Final   Bacteria, UA None seen None seen/Few Final  Culture, blood (single) w Reflex to ID Panel     Status: Abnormal   Collection Time: 03/18/23  2:15 PM   Specimen: Blood   BD  Result Value Ref Range Status    BLOOD CULTURE, ROUTINE Final report (A)  Final   Organism ID, Bacteria Staphylococcus aureus (A)  Final    Comment: Based on susceptibility to oxacillin this isolate would be susceptible to: *Penicillinase-stable penicillins, such as:   Cloxacillin, Dicloxacillin, Nafcillin *Beta-lactam combination agents, such as:   Amoxicillin-clavulanic acid, Ampicillin-sulbactam,   Piperacillin-tazobactam *Oral cephems, such as:   Cefaclor, Cefdinir, Cefpodoxime, Cefprozil, Cefuroxime,   Cephalexin, Loracarbef *Parenteral cephems, such as:   Cefazolin, Cefepime, Cefotaxime, Cefotetan, Ceftaroline,   Ceftizoxime, Ceftriaxone, Cefuroxime *Carbapenems, such as:   Doripenem, Ertapenem, Imipenem, Meropenem Recovered from aerobic and anaerobic bottles.    Antimicrobial Susceptibility Comment  Final    Comment:       ** S = Susceptible; I = Intermediate; R = Resistant **                    P = Positive; N = Negative             MICS are expressed in micrograms per mL    Antibiotic                 RSLT#1    RSLT#2    RSLT#3    RSLT#4 Ciprofloxacin                  S Clindamycin                    S Erythromycin                   R Gentamicin                     S Levofloxacin                   S Linezolid                      S Moxifloxacin                   S Oxacillin                      S Penicillin                     R Quinupristin/Dalfopristin      S Rifampin                       S Tetracycline  S Trimethoprim/Sulfa             S Vancomycin                     S   Culture, blood (Routine x 2)     Status: None   Collection Time: 03/19/23  8:15 PM   Specimen: BLOOD  Result Value Ref Range Status   Specimen Description BLOOD SITE NOT SPECIFIED  Final   Special Requests   Final    BOTTLES DRAWN AEROBIC AND ANAEROBIC Blood Culture adequate volume   Culture   Final    NO GROWTH 5 DAYS Performed at [facility], [address] [place], [place] [address]    Report  Status 03/24/2023 FINAL  Final  Culture, blood (Routine x 2)     Status: None   Collection Time: 03/19/23 10:24 PM   Specimen: BLOOD  Result Value Ref Range Status   Specimen Description BLOOD SITE NOT SPECIFIED  Final   Special Requests   Final    BOTTLES DRAWN AEROBIC AND ANAEROBIC Blood Culture adequate volume   Culture   Final    NO GROWTH 5 DAYS Performed at [facility], [address] [place], [place] [address]    Report Status 03/24/2023 FINAL  Final    Melvia Heaps, PGY 1 [facility] [phone] pager   [phone] cell 03/24/2023, 10:54 AM

## 2023-03-25 ENCOUNTER — Telehealth: Payer: Self-pay | Admitting: *Deleted

## 2023-03-25 ENCOUNTER — Encounter (HOSPITAL_COMMUNITY): Payer: Self-pay | Admitting: Internal Medicine

## 2023-03-25 ENCOUNTER — Ambulatory Visit: Payer: Medicare Other | Admitting: Internal Medicine

## 2023-03-25 DIAGNOSIS — I509 Heart failure, unspecified: Secondary | ICD-10-CM | POA: Diagnosis not present

## 2023-03-25 DIAGNOSIS — L97811 Non-pressure chronic ulcer of other part of right lower leg limited to breakdown of skin: Secondary | ICD-10-CM | POA: Diagnosis not present

## 2023-03-25 DIAGNOSIS — I110 Hypertensive heart disease with heart failure: Secondary | ICD-10-CM | POA: Diagnosis not present

## 2023-03-25 DIAGNOSIS — J4489 Other specified chronic obstructive pulmonary disease: Secondary | ICD-10-CM | POA: Diagnosis not present

## 2023-03-25 DIAGNOSIS — E039 Hypothyroidism, unspecified: Secondary | ICD-10-CM | POA: Diagnosis not present

## 2023-03-25 DIAGNOSIS — I872 Venous insufficiency (chronic) (peripheral): Secondary | ICD-10-CM | POA: Diagnosis not present

## 2023-03-25 DIAGNOSIS — E785 Hyperlipidemia, unspecified: Secondary | ICD-10-CM | POA: Diagnosis not present

## 2023-03-25 DIAGNOSIS — N400 Enlarged prostate without lower urinary tract symptoms: Secondary | ICD-10-CM | POA: Diagnosis not present

## 2023-03-25 DIAGNOSIS — Z7901 Long term (current) use of anticoagulants: Secondary | ICD-10-CM | POA: Diagnosis not present

## 2023-03-25 DIAGNOSIS — B9561 Methicillin susceptible Staphylococcus aureus infection as the cause of diseases classified elsewhere: Secondary | ICD-10-CM | POA: Diagnosis not present

## 2023-03-25 DIAGNOSIS — I2510 Atherosclerotic heart disease of native coronary artery without angina pectoris: Secondary | ICD-10-CM | POA: Diagnosis not present

## 2023-03-25 DIAGNOSIS — J439 Emphysema, unspecified: Secondary | ICD-10-CM | POA: Diagnosis not present

## 2023-03-25 DIAGNOSIS — I480 Paroxysmal atrial fibrillation: Secondary | ICD-10-CM | POA: Diagnosis not present

## 2023-03-25 DIAGNOSIS — M810 Age-related osteoporosis without current pathological fracture: Secondary | ICD-10-CM | POA: Diagnosis not present

## 2023-03-25 DIAGNOSIS — Z792 Long term (current) use of antibiotics: Secondary | ICD-10-CM | POA: Diagnosis not present

## 2023-03-25 DIAGNOSIS — Z452 Encounter for adjustment and management of vascular access device: Secondary | ICD-10-CM | POA: Diagnosis not present

## 2023-03-25 DIAGNOSIS — N319 Neuromuscular dysfunction of bladder, unspecified: Secondary | ICD-10-CM | POA: Diagnosis not present

## 2023-03-25 DIAGNOSIS — Z7952 Long term (current) use of systemic steroids: Secondary | ICD-10-CM | POA: Diagnosis not present

## 2023-03-25 DIAGNOSIS — G629 Polyneuropathy, unspecified: Secondary | ICD-10-CM | POA: Diagnosis not present

## 2023-03-25 NOTE — Transitions of Care (Post Inpatient/ED Visit) (Signed)
03/25/2023  Name: Levi [name] MRN: [id number] DOB: 1945/12/02  Today's TOC FU Call Status: Today's TOC FU Call Status:: Successful TOC FU Call Completed TOC FU Call Complete Date: 03/25/23 Patient's Name and Date of Birth confirmed.  Transition Care Management Follow-up Telephone Call Date of Discharge: 03/24/23 Discharge Facility: [facility]) Type of Discharge: Inpatient Admission Primary Inpatient Discharge Diagnosis:: MSSA bacteremia How have you been since you were released from the hospital?: Better Any questions or concerns?: No (Per patient wife she was surprised that they send a patient home with the instructions and showing the family how to do the atbx treatment. She said she maily had to get over the nervousness)  Items Reviewed: Did you receive and understand the discharge instructions provided?: Yes Medications obtained,verified, and reconciled?: Yes (Medications Reviewed) Any new allergies since your discharge?: No Dietary orders reviewed?: No Do you have support at home?: Yes People in Home: spouse Name of Support/Comfort Primary Source: Jill Side  Medications Reviewed Today: Medications Reviewed Today     Reviewed by Luella Cook, RN (Case Manager) on 03/25/23 at 1601  Med List Status: <None>   Medication Order Taking? Sig Documenting Provider Last Dose Status Informant  albuterol (VENTOLIN HFA) 108 (90 Base) MCG/ACT inhaler [id number] Yes INHALE 2 PUFFS BY MOUTH EVERY 4 HOURS AS NEEDED FOR WHEEZE OR FOR SHORTNESS OF BREATH Young, Clinton D, MD Taking Active Self  allopurinol (ZYLOPRIM) 100 MG tablet [id number] Yes Take 1 tablet (100 mg total) by mouth in the morning. Monica Becton, MD Taking Active Self  atorvastatin (LIPITOR) 20 MG tablet [id number] Yes TAKE 1 TABLET BY MOUTH EVERY DAY Jake Bathe, MD Taking Active Self  ceFAZolin (ANCEF) IVPB [id number] Yes Inject 2 g into the vein every 8 (eight) hours for 8 days. Indication:  MSSA  bacteremia First Dose: Yes Last Day of Therapy:  04/01/23 Labs - Once weekly:  CBC/D and BMP, Labs - Once weekly: ESR and CRP Method of administration: IV Push Method of administration may be changed at the discretion of home infusion pharmacist based upon assessment of the patient and/or caregiver's ability to self-administer the medication ordered. Jonah Blue, MD Taking Active   cyclobenzaprine (FLEXERIL) 10 MG tablet [id number] Yes Take 1 tablet (10 mg total) by mouth at bedtime. Monica Becton, MD Taking Active Self  diclofenac Sodium (VOLTAREN) 1 % GEL [id number] Yes Apply 2 g topically 3 (three) times daily as needed (pain.). [provider] Taking Active Self  diltiazem (CARDIZEM CD) 180 MG 24 hr capsule [id number] Yes TAKE 1 CAPSULE BY MOUTH EVERY DAY Jake Bathe, MD Taking Active Self  diltiazem (CARDIZEM) 30 MG tablet [id number] Yes TAKE 1 TABLET (30 MG TOTAL) BY MOUTH AS NEEDED (HEART RATE OVER 120). Jake Bathe, MD Taking Active Self  DULoxetine (CYMBALTA) 30 MG capsule [id number] Yes Take 1 capsule (30 mg total) by mouth daily. Shawnie Dapper, NP Taking Active Self  ELIQUIS 5 MG TABS tablet [id number] Yes TAKE 1 TABLET BY MOUTH TWICE A DAY Jake Bathe, MD Taking Active Self  ipratropium-albuterol (DUONEB) 0.5-2.5 (3) MG/3ML SOLN [id number] Yes 1 NEBULE EVERY 6 HOURS AS NEEDED. **J45.3**  Patient taking differently: Take 3 mLs by nebulization every 6 (six) hours as needed (for shortness of breath and wheezing).   Waymon Budge, MD Taking Active Self  isosorbide mononitrate (IMDUR) 30 MG 24 hr tablet [id number] Yes TAKE 1 TABLET BY MOUTH EVERY DAY Jake Bathe, MD Taking Active Self  leptospermum manuka honey (MEDIHONEY) PSTE paste [id number] Yes Apply 1 Application topically daily. Jonah Blue, MD Taking Active   Nebulizers (COMPRESSOR/NEBULIZER) MISC [id number] Yes Use as directed Waymon Budge, MD Taking Active Self  Olodaterol HCl (STRIVERDI RESPIMAT) 2.5  MCG/ACT AERS [id number] Yes INHALE 2 PUFFS BY MOUTH INTO THE LUNGS DAILY Jetty Duhamel D, MD Taking Active Self  pantoprazole (PROTONIX) 40 MG tablet [id number] Yes TAKE 1 TABLET (40 MG TOTAL) BY MOUTH TWICE A DAY BEFORE MEALS Monica Becton, MD Taking Active Self  predniSONE (DELTASONE) 10 MG tablet [id number] Yes Take 1 tablet (10 mg total) by mouth in the morning and at bedtime. Waymon Budge, MD Taking Active Self  pregabalin (LYRICA) 75 MG capsule [id number] Yes Take 1 capsule (75 mg total) by mouth 3 (three) times daily.  Patient taking differently: Take 75 mg by mouth 2 (two) times daily.   Monica Becton, MD Taking Active Self  tamsulosin [facility]) 0.4 MG CAPS capsule [id number] Yes Take 0.4 mg by mouth at bedtime. [provider] Taking Active Self            Home Care and Equipment/Supplies: Were Home Health Services Ordered?: Yes Name of [facility] Health Agency:: [facility] Has Agency set up a time to come to your home?: Yes First Home Health Visit Date: 03/25/23 Any new equipment or medical supplies ordered?: Yes Name of Medical supply agency?: [facility] Were you able to get the equipment/medical supplies?: Yes Do you have any questions related to the use of the equipment/supplies?: No  Functional Questionnaire: Do you need assistance with bathing/showering or dressing?: Yes Do you need assistance with meal preparation?: Yes Do you need assistance with eating?: No Do you need assistance with getting out of bed/getting out of a chair/moving?: Yes Do you have difficulty managing or taking your medications?: No  Follow up appointments reviewed: PCP Follow-up appointment confirmed?: No MD Provider Line [phone] Given: Yes (Patient wife has an appointment tomorrow and will make the appointment herself) Specialist Hospital Follow-up appointment confirmed?: Yes Date of Specialist follow-up appointment?: 03/31/23 Follow-Up Specialty Provider::  [id number] infectious disease/ [id number] Dr Lamar Blinks Do you need transportation to your follow-up appointment?: No Do you understand care options if your condition(s) worsen?: Yes-patient verbalized understanding  SDOH Interventions Today    Flowsheet Row Most Recent Value  SDOH Interventions   Food Insecurity Interventions Intervention Not Indicated  Housing Interventions Intervention Not Indicated  Transportation Interventions Intervention Not Indicated, Patient Resources (Friends/Family)  Utilities Interventions Intervention Not Indicated      Interventions Today    Flowsheet Row Most Recent Value  General Interventions   General Interventions Discussed/Reviewed General Interventions Discussed, General Interventions Reviewed, Doctor Visits  Doctor Visits Discussed/Reviewed Doctor Visits Discussed, Doctor Visits Reviewed  Pharmacy Interventions   Pharmacy Dicussed/Reviewed Pharmacy Topics Discussed, Pharmacy Topics Reviewed      TOC Interventions Today    Flowsheet Row Most Recent Value  TOC Interventions   TOC Interventions Discussed/Reviewed TOC Interventions Discussed, TOC Interventions Reviewed, Post op wound/incision care      Patient wife declined further care Coordination outreach calls.  Gean Maidens BSN RN [facility] [phone]

## 2023-03-26 DIAGNOSIS — Z9889 Other specified postprocedural states: Secondary | ICD-10-CM | POA: Diagnosis not present

## 2023-03-26 DIAGNOSIS — M79604 Pain in right leg: Secondary | ICD-10-CM | POA: Diagnosis not present

## 2023-03-27 DIAGNOSIS — J439 Emphysema, unspecified: Secondary | ICD-10-CM | POA: Diagnosis not present

## 2023-03-27 DIAGNOSIS — I110 Hypertensive heart disease with heart failure: Secondary | ICD-10-CM | POA: Diagnosis not present

## 2023-03-27 DIAGNOSIS — I872 Venous insufficiency (chronic) (peripheral): Secondary | ICD-10-CM | POA: Diagnosis not present

## 2023-03-27 DIAGNOSIS — L97811 Non-pressure chronic ulcer of other part of right lower leg limited to breakdown of skin: Secondary | ICD-10-CM | POA: Diagnosis not present

## 2023-03-27 DIAGNOSIS — I480 Paroxysmal atrial fibrillation: Secondary | ICD-10-CM | POA: Diagnosis not present

## 2023-03-27 DIAGNOSIS — I509 Heart failure, unspecified: Secondary | ICD-10-CM | POA: Diagnosis not present

## 2023-03-31 ENCOUNTER — Telehealth: Payer: Self-pay

## 2023-03-31 ENCOUNTER — Other Ambulatory Visit: Payer: Self-pay

## 2023-03-31 ENCOUNTER — Encounter: Payer: Self-pay | Admitting: Family

## 2023-03-31 ENCOUNTER — Ambulatory Visit (INDEPENDENT_AMBULATORY_CARE_PROVIDER_SITE_OTHER): Payer: Medicare Other | Admitting: Family

## 2023-03-31 VITALS — BP 135/74 | HR 87 | Temp 97.4°F | Ht 74.0 in | Wt 195.0 lb

## 2023-03-31 DIAGNOSIS — Z95828 Presence of other vascular implants and grafts: Secondary | ICD-10-CM | POA: Insufficient documentation

## 2023-03-31 DIAGNOSIS — R7881 Bacteremia: Secondary | ICD-10-CM | POA: Diagnosis not present

## 2023-03-31 DIAGNOSIS — B9561 Methicillin susceptible Staphylococcus aureus infection as the cause of diseases classified elsewhere: Secondary | ICD-10-CM

## 2023-03-31 NOTE — Telephone Encounter (Signed)
Per Marcos Eke NP leave picc in place until lab work is back. Patient states they have enough medication till Wednesday. Sent a message to Jeri Modena, Rn at [facility] about changes.

## 2023-03-31 NOTE — Assessment & Plan Note (Signed)
PICC line located in right upper extremity and functioning appropriately. Labs drawn from PICC. Continue PICC care per protocol. End date currently remains 04/01/23.

## 2023-03-31 NOTE — Progress Notes (Signed)
Subjective:    Patient ID: Levi [name], male    DOB: 20-Apr-1946, 77 y.o.   MRN: [id number]  Chief Complaint  Patient presents with   Hospitalization Follow-up    MSSA bacteremia    HPI:  Levi [name] is a 77 y.o. male with recent hospitalization for MSSA bacteremia last seen by Dr. Thedore Mins on on 03/24/23 with unclear source of infection and negative TEE. Question if related to a recent fall. Blood cultures from 03/19/23 were without growth. Plan was for treatment for 2 weeks with Cefazolin through end date 04/01/23. Inflammatory markers were elevated with CRP 233 and Sed Rate 51. Here today for hospitalization follow up.  Levi [name] has been doing okay since leaving the hospital about 1 week ago and has been receiving the Cefazolin with no adverse side effects. Has not had lab work drawn. Continues with wound care on his right lower leg with Medihoney and dressing changes. Feeling fatigue and easily tired. Last Wednesday there was concern for possible worsening of his right shin wound which has now been improving. Cold on occasion but denies any fevers. PICC line functioning appropriately.    Allergies  Allergen Reactions   Levaquin [Levofloxacin] Itching      Outpatient Medications Prior to Visit  Medication Sig Dispense Refill   albuterol (VENTOLIN HFA) 108 (90 Base) MCG/ACT inhaler INHALE 2 PUFFS BY MOUTH EVERY 4 HOURS AS NEEDED FOR WHEEZE OR FOR SHORTNESS OF BREATH 8.5 each 12   allopurinol (ZYLOPRIM) 100 MG tablet Take 1 tablet (100 mg total) by mouth in the morning. 90 tablet 3   atorvastatin (LIPITOR) 20 MG tablet TAKE 1 TABLET BY MOUTH EVERY DAY 90 tablet 2   ceFAZolin (ANCEF) IVPB Inject 2 g into the vein every 8 (eight) hours for 8 days. Indication:  MSSA bacteremia First Dose: Yes Last Day of Therapy:  04/01/23 Labs - Once weekly:  CBC/D and BMP, Labs - Once weekly: ESR and CRP Method of administration: IV Push Method of administration may be changed at the discretion  of home infusion pharmacist based upon assessment of the patient and/or caregiver's ability to self-administer the medication ordered. 24 Units 0   cyclobenzaprine (FLEXERIL) 10 MG tablet Take 1 tablet (10 mg total) by mouth at bedtime. 90 tablet 1   diclofenac Sodium (VOLTAREN) 1 % GEL Apply 2 g topically 3 (three) times daily as needed (pain.).     diltiazem (CARDIZEM CD) 180 MG 24 hr capsule TAKE 1 CAPSULE BY MOUTH EVERY DAY 90 capsule 3   diltiazem (CARDIZEM) 30 MG tablet TAKE 1 TABLET (30 MG TOTAL) BY MOUTH AS NEEDED (HEART RATE OVER 120). 90 tablet 1   DULoxetine (CYMBALTA) 30 MG capsule Take 1 capsule (30 mg total) by mouth daily. 90 capsule 3   ELIQUIS 5 MG TABS tablet TAKE 1 TABLET BY MOUTH TWICE A DAY 180 tablet 1   ipratropium-albuterol (DUONEB) 0.5-2.5 (3) MG/3ML SOLN 1 NEBULE EVERY 6 HOURS AS NEEDED. **J45.3** (Patient taking differently: Take 3 mLs by nebulization every 6 (six) hours as needed (for shortness of breath and wheezing).) 270 mL 4   isosorbide mononitrate (IMDUR) 30 MG 24 hr tablet TAKE 1 TABLET BY MOUTH EVERY DAY 90 tablet 1   leptospermum manuka honey (MEDIHONEY) PSTE paste Apply 1 Application topically daily. 44 mL 0   Nebulizers (COMPRESSOR/NEBULIZER) MISC Use as directed 1 each 0   Olodaterol HCl (STRIVERDI RESPIMAT) 2.5 MCG/ACT AERS INHALE 2 PUFFS BY MOUTH INTO THE LUNGS DAILY  4 g 11   pantoprazole (PROTONIX) 40 MG tablet TAKE 1 TABLET (40 MG TOTAL) BY MOUTH TWICE A DAY BEFORE MEALS 180 tablet 2   predniSONE (DELTASONE) 10 MG tablet Take 1 tablet (10 mg total) by mouth in the morning and at bedtime. 60 tablet 11   pregabalin (LYRICA) 75 MG capsule Take 1 capsule (75 mg total) by mouth 3 (three) times daily. (Patient taking differently: Take 75 mg by mouth 2 (two) times daily.) 90 capsule 3   tamsulosin (FLOMAX) 0.4 MG CAPS capsule Take 0.4 mg by mouth at bedtime.     No facility-administered medications prior to visit.     Past Medical History:  Diagnosis Date    Agent orange exposure 1970's   takes Imdur,Diltiazem, and Atacand daily   ALLERGIC RHINITIS    Allergy    seasonal allergies   Asthma    uses inhaler and nebulizers   Bruises easily    d/t prednisone daily   Cataract    CHF (congestive heart failure) (HCC)    dx-on meds to treat   Chronic bronchitis (HCC)    "used to get it q yr; last time ?2008" (04/28/2012)   Clotting disorder (HCC) 2019   On blood thinner when I bleed it is for a consideral amount of time. This is tegardless of size of wound.   COPD (chronic obstructive pulmonary disease) (HCC)    agent orange exposure   Degenerative disk disease    "everywhere" (04/28/2012)   Diverticulitis    Dysrhythmia    afib   Elevated uric acid in blood    takes Allopurinol daily   Emphysema of lung (HCC)    Enlarged prostate    but not on any meds   GERD (gastroesophageal reflux disease)    takes Protonix daily   H/O hiatal hernia    Headache    History of pneumonia    a. 2010   Hyperlipidemia    takes Lipitor daily; pt. states he takes a preventive   Hypothyroidism    Joint pain    Joint swelling    Nausea and vomiting 11/12/2022   Neuromuscular disorder (HCC)    bilat legs, and bilat arms   Osteoporosis 2012   PAF (paroxysmal atrial fibrillation) (HCC)    a. Dx 04/2012, CHA2DS2VASc = 1 (age);  b. 04/2012 Echo: EF 40-50%, mild MR.   Personal history of colonic adenomas 02/09/2013   Pneumonia    Shortness of breath dyspnea    Thyroid disease    no taking meds-caused dizziness     Past Surgical History:  Procedure Laterality Date   33 HOUR PH STUDY N/A 05/30/2021   Procedure: 24 HOUR PH STUDY;  Surgeon: Shellia Cleverly, DO;  Location: [facility];  Service: Gastroenterology;  Laterality: N/A;   ANTERIOR CERVICAL DECOMP/DISCECTOMY FUSION N/A 07/21/2012   Procedure: ANTERIOR CERVICAL DECOMPRESSION/DISCECTOMY FUSION 2 LEVELS;  Surgeon: Barnett Abu, MD;  Location: [facility];  Service: Neurosurgery;  Laterality:  N/A;  C4-5 C5-6 Anterior cervical decompression/diskectomy/fusion   ANTERIOR LAT LUMBAR FUSION N/A 04/07/2017   Procedure: Thoracic twelve-Lumbar one Anterolateral decompression/fusion;  Surgeon: Barnett Abu, MD;  Location: [facility] OR;  Service: Neurosurgery;  Laterality: N/A;   ARTHRODESIS FOOT WITH WEIL OSTEOTOMY Left 11/07/2022   Procedure: LEFT THIRD METATARSOPHALANGEAL CAPSULOTOMY, WEIL OSTEOTOMY;  Surgeon: Terance Hart, MD;  Location: [facility] OR;  Service: Orthopedics;  Laterality: Left;  LENGTH OF SURGERY: 60 MINUTES   BACK SURGERY     x 3  CARDIAC CATHETERIZATION  11/2008   Dr. Anne Fu - 20% calcified non flow limiting left main, 50% EF apical hypokinesis   CERVICAL DISC ARTHROPLASTY N/A 04/07/2017   Procedure: Cervical six-seven Disc arthroplasty;  Surgeon: Barnett Abu, MD;  Location: [facility] OR;  Service: Neurosurgery;  Laterality: N/A;   CHEST TUBE INSERTION Left 04/11/2017   Procedure: CHEST TUBE INSERTION;  Surgeon: Kerin Perna, MD;  Location: [facility] OR;  Service: Thoracic;  Laterality: Left;   CHOLECYSTECTOMY     COLONOSCOPY  2016   CG-MAC-miralax(good)servere TICS/TA x 2   ESOPHAGEAL MANOMETRY N/A 05/30/2021   Procedure: ESOPHAGEAL MANOMETRY (EM);  Surgeon: Shellia Cleverly, DO;  Location: [facility];  Service: Gastroenterology;  Laterality: N/A;  ph impedence   ESOPHAGOGASTRODUODENOSCOPY     EYE SURGERY Bilateral 2011   steroidal encapsulation" (04/28/2012)   FOOT SURGERY Right    x 2   JOINT REPLACEMENT     Many surgeries including 5 joit replacements   KNEE ARTHROSCOPY Bilateral    LATERAL / POSTERIOR COMBINED FUSION LUMBAR SPINE  2012   LEFT ATRIAL APPENDAGE OCCLUSION N/A 02/22/2021   Procedure: LEFT ATRIAL APPENDAGE OCCLUSION;  Surgeon: Lanier Prude, MD;  [facility];  Service: Cardiovascular;  Laterality: N/A;   POLYPECTOMY  2016   severe TICS/TA x 2   POSTERIOR CERVICAL LAMINECTOMY WITH MET- RX Right 06/11/2021   Procedure: Right Cervical  six-seven  Laminectomy and foraminotomy with metrex;  Surgeon: Barnett Abu, MD;  Location: [facility] OR;  Service: Neurosurgery;  Laterality: Right;   REVERSE SHOULDER ARTHROPLASTY  04/28/2012   Procedure: REVERSE SHOULDER ARTHROPLASTY;  Surgeon: Mable Paris, MD;  Location: [facility] OR;  Service: Orthopedics;  Laterality: Left;  Left shouder reverse total shoulder arthroplasty   SHOULDER SURGERY     SPINE SURGERY     I have had 7 spinal surgeries   TEE WITHOUT CARDIOVERSION N/A 02/22/2021   Procedure: TRANSESOPHAGEAL ECHOCARDIOGRAM (TEE);  Surgeon: Lanier Prude, MD;  Location: [facility] INVASIVE CV LAB;  Service: Cardiovascular;  Laterality: N/A;   TENDON REPAIR Right 08/07/2020   Procedure: RIGHT HAND LIGAMENT RECONSTRUCTION AND TENDON INTERPOSITION;  Surgeon: Jodi Geralds, MD;  Location: [facility];  Service: Orthopedics;  Laterality: Right;   TONSILLECTOMY AND ADENOIDECTOMY  1954   TOTAL HIP ARTHROPLASTY Left 2011   "left" (04/28/2012)   TOTAL HIP ARTHROPLASTY Right 07/31/2015   Procedure: TOTAL HIP ARTHROPLASTY ANTERIOR APPROACH;  Surgeon: Jodi Geralds, MD;  Location[facility] OR;  Service: Orthopedics;  Laterality: Right;   TOTAL KNEE ARTHROPLASTY  01/10/2012   Procedure: TOTAL KNEE ARTHROPLASTY;  Surgeon: Harvie Junior, MD;  Location: [facility] OR;  Service: Orthopedics;;  left total knee arthroplasty   TOTAL KNEE ARTHROPLASTY Right 06/17/2014   Procedure: TOTAL KNEE ARTHROPLASTY;  Surgeon: Harvie Junior, MD;  Location: [facility] OR;  Service: Orthopedics;  Laterality: Right;   toupet fundolplication  11/01/2021   TRANSESOPHAGEAL ECHOCARDIOGRAM (CATH LAB) N/A 03/24/2023   Procedure: TRANSESOPHAGEAL ECHOCARDIOGRAM;  Surgeon: Chrystie Nose, MD;  [facility];  Service: Cardiovascular;  Laterality: N/A;       Review of Systems  Constitutional:  Positive for fatigue. Negative for chills, diaphoresis and fever.  Respiratory:  Negative for cough, chest tightness, shortness of  breath and wheezing.   Cardiovascular:  Negative for chest pain.  Gastrointestinal:  Negative for abdominal pain, diarrhea, nausea and vomiting.  Skin:  Positive for wound.      Objective:    BP 135/74  Pulse 87   Temp (!) 97.4 F (36.3 C) (Temporal)   Ht 6\' 2"  (1.88 m)   Wt 195 lb (88.5 kg)   SpO2 97%   BMI 25.04 kg/m  Nursing note and vital signs reviewed.  Physical Exam Constitutional:      General: He is not in acute distress.    Appearance: He is well-developed.  Cardiovascular:     Rate and Rhythm: Normal rate and regular rhythm.     Heart sounds: Normal heart sounds.     Comments: PICC line right upper extremity Pulmonary:     Effort: Pulmonary effort is normal.     Breath sounds: Normal breath sounds.  Skin:    General: Skin is warm and dry.  Neurological:     Mental Status: He is alert and oriented to person, place, and time.           03/12/2023    8:51 AM 11/18/2022   11:24 AM 10/03/2022    8:11 AM 06/07/2022    9:21 AM  Depression screen PHQ 2/9  Decreased Interest 0 1 0 0  Down, Depressed, Hopeless 0 0 0 0  PHQ - 2 Score 0 1 0 0       Assessment & Plan:    Patient Active Problem List   Diagnosis Date Noted   S/P PICC central line placement 03/31/2023   Venous insufficiency of both lower extremities 03/20/2023   MSSA bacteremia 03/19/2023   Sepsis (HCC) 03/18/2023   Chronic superficial venous thrombosis of both lower extremities 11/18/2022   Nausea and vomiting 11/12/2022   Venous stasis ulcer (HCC) 09/13/2022   Primary osteoarthritis of right shoulder, status post left shoulder arthroplasty 07/05/2022   Shingles 05/16/2022   Olecranon bursitis, right elbow 04/04/2022   Vertigo 04/04/2022   Orthostatic hypotension 03/21/2022   BPH (benign prostatic hyperplasia) 02/21/2022   Annual physical exam 01/22/2022   Charcot's joint of foot, right 01/22/2022   Lower extremity edema 08/08/2021   Heartburn    Atypical chest pain    Cervical  radicular pain 06/11/2021   Chronic anticoagulation 03/15/2021   Peripheral neuropathy 02/22/2021   Chronic systolic heart failure (HCC) 02/22/2021   Essential (primary) hypertension 11/15/2020   Arthritis of carpometacarpal (CMC) joint of right thumb 08/07/2020   Cellulitis 03/22/2020   COPD (chronic obstructive pulmonary disease) (HCC) 03/21/2020   Chest wall pain 09/21/2019   GERD (gastroesophageal reflux disease) status post Nissen fundoplication 12/03/2017   Pneumothorax, left 04/11/2017   Lumbar stenosis with neurogenic claudication 04/07/2017   History of total right hip arthroplasty 07/31/2015   Primary osteoarthritis of right knee 06/17/2014   Thrush of mouth and esophagus (HCC) 04/03/2014   Palpitation 03/28/2014   Pseudoarthrosis of lumbar spine 06/15/2013   Hyperlipidemia    History of colonic polyps 02/09/2013   Paroxysmal atrial fibrillation (HCC) 05/11/2012   Osteoarthritis of left knee 01/10/2012   Seasonal and perennial allergic rhinitis 07/23/2010   Asthma, moderate persistent 03/06/2009     Problem List Items Addressed This Visit       Other   MSSA bacteremia - Primary    Mr. Cease is nearing completion of 2 weeks of Cefazolin for MSSA bacteremia with suspected nidus being his chronic right shin wound. Tolerating Cefazolin with no adverse side effects and continues with wound care. Wound is overall improved. Encouraged protein to aid in wound healing. Check lab work. Continue current dose of Cefazolin for now and pending lab work will consider continuation of Cefazolin  or change to short course of oral antibiotic with Cefadroxil. Plan for follow up in 3 weeks or sooner if needed.       Relevant Orders   BASIC METABOLIC PANEL WITH GFR   Sedimentation rate   C-reactive protein   CBC   S/P PICC central line placement    PICC line located in right upper extremity and functioning appropriately. Labs drawn from PICC. Continue PICC care per protocol. End date  currently remains 04/01/23.         I am having Levi [name] "Levi [name]" maintain his Compressor/Nebulizer, ipratropium-albuterol, diclofenac Sodium, pregabalin, atorvastatin, diltiazem, predniSONE, tamsulosin, pantoprazole, allopurinol, DULoxetine, albuterol, Eliquis, diltiazem, isosorbide mononitrate, Striverdi Respimat, cyclobenzaprine, ceFAZolin, and leptospermum manuka honey.   No orders of the defined types were placed in this encounter.    Follow-up: No follow-ups on file. or sooner if needed.   Marcos Eke, MSN, FNP-C Nurse Practitioner [facility] Main number: [phone]

## 2023-03-31 NOTE — Patient Instructions (Addendum)
Nice to see you.  We will check your lab work today.  Continue to take your medication daily as prescribed.  Continue with wound care.  Plan for follow up pending in lab work results.   Have a great day and stay safe!

## 2023-03-31 NOTE — Progress Notes (Signed)
Labs drawn via midline line per Marcos Eke, NP. Line flushed with 10 mL normal saline and clamped. Patient tolerated procedure well.   Sandie Ano, RN

## 2023-03-31 NOTE — Assessment & Plan Note (Signed)
Levi [name] is nearing completion of 2 weeks of Cefazolin for MSSA bacteremia with suspected nidus being his chronic right shin wound. Tolerating Cefazolin with no adverse side effects and continues with wound care. Wound is overall improved. Encouraged protein to aid in wound healing. Check lab work. Continue current dose of Cefazolin for now and pending lab work will consider continuation of Cefazolin or change to short course of oral antibiotic with Cefadroxil. Plan for follow up in 3 weeks or sooner if needed.

## 2023-04-01 LAB — CBC
HCT: 34.2 % — ABNORMAL LOW (ref 38.5–50.0)
Hemoglobin: 11.2 g/dL — ABNORMAL LOW (ref 13.2–17.1)
MCH: 30.2 pg (ref 27.0–33.0)
MCHC: 32.7 g/dL (ref 32.0–36.0)
MCV: 92.2 fL (ref 80.0–100.0)
MPV: 12.3 fL (ref 7.5–12.5)
Platelets: 284 10*3/uL (ref 140–400)
RBC: 3.71 10*6/uL — ABNORMAL LOW (ref 4.20–5.80)
RDW: 15.7 % — ABNORMAL HIGH (ref 11.0–15.0)
WBC: 9 10*3/uL (ref 3.8–10.8)

## 2023-04-01 LAB — BASIC METABOLIC PANEL WITH GFR
BUN: 15 mg/dL (ref 7–25)
CO2: 26 mmol/L (ref 20–32)
Calcium: 9.2 mg/dL (ref 8.6–10.3)
Chloride: 106 mmol/L (ref 98–110)
Creat: 0.78 mg/dL (ref 0.70–1.28)
Glucose, Bld: 98 mg/dL (ref 65–99)
Potassium: 4.1 mmol/L (ref 3.5–5.3)
Sodium: 141 mmol/L (ref 135–146)
eGFR: 93 mL/min/{1.73_m2} (ref >=60)

## 2023-04-01 LAB — SEDIMENTATION RATE: Sed Rate: 17 mm/h (ref 0–20)

## 2023-04-01 LAB — C-REACTIVE PROTEIN: CRP: 11.7 mg/L — ABNORMAL HIGH (ref <8.0)

## 2023-04-01 MED ORDER — CEFADROXIL 500 MG PO CAPS: 1000.0000 mg | ORAL_CAPSULE | Freq: Two times a day (BID) | ORAL | 0 refills | Status: AC

## 2023-04-01 NOTE — Addendum Note (Signed)
Addended by: Juanita Laster on: 04/01/2023 04:15 PM   Modules accepted: Orders

## 2023-04-01 NOTE — Telephone Encounter (Signed)
Per Carver Fila, FNP okay to pull picc. Community message forwarded to [employer] team. Will send in send in Cefadroxil 1 g PO bid for 10 days. To pharmacy.  Patient made aware. Juanita Laster, RMA

## 2023-04-01 NOTE — Telephone Encounter (Signed)
Patient called office today to follow up on labs from yesterday. Would like to know if labs show if okay to have picc removed. Informed patient that staff would reach out to provider and get back to him once labs have been reviewed.  Assured patient that we would also update HH regarding picc line orders. Juanita Laster, RMA

## 2023-04-02 ENCOUNTER — Telehealth: Payer: Self-pay | Admitting: Sports Medicine

## 2023-04-02 DIAGNOSIS — I110 Hypertensive heart disease with heart failure: Secondary | ICD-10-CM | POA: Diagnosis not present

## 2023-04-02 DIAGNOSIS — I480 Paroxysmal atrial fibrillation: Secondary | ICD-10-CM | POA: Diagnosis not present

## 2023-04-02 DIAGNOSIS — I872 Venous insufficiency (chronic) (peripheral): Secondary | ICD-10-CM | POA: Diagnosis not present

## 2023-04-02 DIAGNOSIS — J439 Emphysema, unspecified: Secondary | ICD-10-CM | POA: Diagnosis not present

## 2023-04-02 DIAGNOSIS — I509 Heart failure, unspecified: Secondary | ICD-10-CM | POA: Diagnosis not present

## 2023-04-02 DIAGNOSIS — L97811 Non-pressure chronic ulcer of other part of right lower leg limited to breakdown of skin: Secondary | ICD-10-CM | POA: Diagnosis not present

## 2023-04-02 NOTE — Telephone Encounter (Signed)
Lets base it on how his back pain is doing.  If the back pain is controlled with the current medical regimen and he does not need to see me, particularly if he is feeling a lot better from a bacteremia/sepsis standpoint.

## 2023-04-02 NOTE — Telephone Encounter (Signed)
Received incoming call from patients spouse Revonda Standard), patient has an appointment tomorrow 04/03/23 at 11:00 am with Dr. Myna Hidalgo at [facility]. Patients  wife wants to know if patient should keep this appointment since he was hospitalized after referral appointment was made. Please advise.   [phone]

## 2023-04-03 ENCOUNTER — Inpatient Hospital Stay: Payer: Medicare Other

## 2023-04-03 ENCOUNTER — Ambulatory Visit: Payer: Medicare Other | Admitting: Hematology & Oncology

## 2023-04-03 NOTE — Telephone Encounter (Signed)
Pt is aware. Pt canceled appointment for 04/03/23 at 11:00 am with Dr. Myna Hidalgo. Pt was also seen by [facility] on 03/31/23. He wishes to keep his appointment on 04/07/23 with Dr. [name].

## 2023-04-07 ENCOUNTER — Encounter: Payer: Self-pay | Admitting: Sports Medicine

## 2023-04-07 ENCOUNTER — Ambulatory Visit (INDEPENDENT_AMBULATORY_CARE_PROVIDER_SITE_OTHER): Payer: Medicare Other | Admitting: Sports Medicine

## 2023-04-07 VITALS — BP 140/87 | HR 100 | Ht 74.0 in | Wt 200.0 lb

## 2023-04-07 DIAGNOSIS — S32591A Other specified fracture of right pubis, initial encounter for closed fracture: Secondary | ICD-10-CM | POA: Diagnosis not present

## 2023-04-07 DIAGNOSIS — R7881 Bacteremia: Secondary | ICD-10-CM

## 2023-04-07 DIAGNOSIS — B9561 Methicillin susceptible Staphylococcus aureus infection as the cause of diseases classified elsewhere: Secondary | ICD-10-CM

## 2023-04-07 DIAGNOSIS — Z23 Encounter for immunization: Secondary | ICD-10-CM

## 2023-04-07 DIAGNOSIS — M48062 Spinal stenosis, lumbar region with neurogenic claudication: Secondary | ICD-10-CM | POA: Diagnosis not present

## 2023-04-07 DIAGNOSIS — Z0000 Encounter for general adult medical examination without abnormal findings: Secondary | ICD-10-CM

## 2023-04-07 MED ORDER — ONDANSETRON 8 MG PO TBDP: 8.0000 mg | ORAL_TABLET | Freq: Three times a day (TID) | ORAL | 3 refills | Status: DC | PRN

## 2023-04-07 MED ORDER — OXYCODONE-ACETAMINOPHEN 10-325 MG PO TABS: 1.0000 | ORAL_TABLET | Freq: Three times a day (TID) | ORAL | 0 refills | Status: DC | PRN

## 2023-04-07 NOTE — Assessment & Plan Note (Signed)
Levi [name] was recently hospitalized as we did note MSSA bacteremia on a blood culture. He is on PICC home antibiotics. Source is suspected to be right lower extremity skin ulcer. He continues with wound care. His wife does noticed greenish tinged purulence at times, I wonder if this could represent pyoverdin from a Pseudomonas source, he will discuss with ID? We did discuss gentle wound debridement with dressing changes today. The plan is for 2 weeks of cefazolin. He will continue his follow-ups with infectious disease.

## 2023-04-07 NOTE — Assessment & Plan Note (Addendum)
Known lumbar spinal stenosis, extensive fusion T12-S1, x-rays were negative for acute injury, MRI obtained the hospital did show expected degenerative changes, nothing acute. I think the majority of his pain was coming from the subacute pubic ramus fracture. There is not much to do with his spine considering the extent of the fusion, we will focus treatment on his SI joints and pubic ramus as we go forward.

## 2023-04-07 NOTE — Assessment & Plan Note (Signed)
Due to chronic pelvic pain we did obtain a CT of the pelvis that did confirm right superior and inferior pubic rami fractures, subacute with callus formation. This is the likely cause of his deep pelvic, buttock pain. He will be partial weightbearing with a 4 place walker for the next month. We will add some Percocet for pain relief in the meantime.

## 2023-04-07 NOTE — Progress Notes (Signed)
    Procedures performed today:    None.  Independent interpretation of notes and tests performed by another provider:   None.  Brief History, Exam, Impression, and Recommendations:    Lumbar stenosis status post T12-S1 fusion Known lumbar spinal stenosis, extensive fusion T12-S1, x-rays were negative for acute injury, MRI obtained the hospital did show expected degenerative changes, nothing acute. I think the majority of his pain was coming from the subacute pubic ramus fracture. There is not much to do with his spine considering the extent of the fusion, we will focus treatment on his SI joints and pubic ramus as we go forward.  Closed fracture of ramus of right pubis (HCC) Due to chronic pelvic pain we did obtain a CT of the pelvis that did confirm right superior and inferior pubic rami fractures, subacute with callus formation. This is the likely cause of his deep pelvic, buttock pain. He will be partial weightbearing with a 4 place walker for the next month. We will add some Percocet for pain relief in the meantime.  Annual physical exam Flu shot today.  MSSA bacteremia [name] was recently hospitalized as we did note MSSA bacteremia on a blood culture. He is on PICC home antibiotics. Source is suspected to be right lower extremity skin ulcer. He continues with wound care. His wife does noticed greenish tinged purulence at times, I wonder if this could represent pyoverdin from a Pseudomonas source, he will discuss with ID? We did discuss gentle wound debridement with dressing changes today. The plan is for 2 weeks of cefazolin. He will continue his follow-ups with infectious disease.    ____________________________________________ Levi [name]. [name], M.D., ABFM., CAQSM., AME. Primary Care and Sports Medicine [facility]  Adjunct Professor of Family Medicine  [employer]  Restaurant manager, fast food

## 2023-04-07 NOTE — Addendum Note (Signed)
Addended by: Monica Becton on: 04/07/2023 02:46 PM   Modules accepted: Orders

## 2023-04-07 NOTE — Assessment & Plan Note (Signed)
Flu shot today 

## 2023-04-08 DIAGNOSIS — F331 Major depressive disorder, recurrent, moderate: Secondary | ICD-10-CM | POA: Diagnosis not present

## 2023-04-09 ENCOUNTER — Inpatient Hospital Stay: Payer: Self-pay | Admitting: Internal Medicine

## 2023-04-09 NOTE — Telephone Encounter (Signed)
Patient aware.

## 2023-04-09 NOTE — Telephone Encounter (Signed)
Patient called asking if he needed any more lab work prior to appointment on 11/25. Informed patient I didn't see Calone,NP advised of having any lab work prior to next appointment but I would send a message.   Levi [name] Lesli Albee, CMA

## 2023-04-11 ENCOUNTER — Ambulatory Visit
Admission: EM | Admit: 2023-04-11 | Discharge: 2023-04-11 | Disposition: A | Discharge status: Home / Self Care | Payer: Medicare Other | Attending: Family Medicine | Admitting: Family Medicine

## 2023-04-11 DIAGNOSIS — S81811A Laceration without foreign body, right lower leg, initial encounter: Secondary | ICD-10-CM | POA: Diagnosis not present

## 2023-04-11 DIAGNOSIS — S81801A Unspecified open wound, right lower leg, initial encounter: Secondary | ICD-10-CM

## 2023-04-11 MED ORDER — DOXYCYCLINE HYCLATE 100 MG PO CAPS: 100.0000 mg | ORAL_CAPSULE | Freq: Two times a day (BID) | ORAL | 0 refills | Status: DC

## 2023-04-11 NOTE — ED Provider Notes (Signed)
[name]    CSN: [id number] Arrival date & time: 04/11/23  0948      History   Chief Complaint Chief Complaint  Patient presents with   Laceration    HPI Levi [name] is a 77 y.o. male.   HPI Pleasant 77 year old male presents with right lower leg laceration/avulsion wound secondary to walking into oven door at home yesterday, accidentally.  PMH significant for CHF, emphysema, and PAF.  Patient is concurrently on Apixaban and denies any unusual bleeding other than this injury.  Patient accompanied by his wife this morning.  Patient just completed cefadroxil/Duricef from previous right leg wound.  Past Medical History:  Diagnosis Date   Agent orange exposure 1970's   takes Imdur,Diltiazem, and Atacand daily   ALLERGIC RHINITIS    Allergy    seasonal allergies   Asthma    uses inhaler and nebulizers   Bruises easily    d/t prednisone daily   Cataract    CHF (congestive heart failure) (HCC)    dx-on meds to treat   Chronic bronchitis (HCC)    "used to get it q yr; last time ?2008" (04/28/2012)   Clotting disorder (HCC) 2019   On blood thinner when I bleed it is for a consideral amount of time. This is tegardless of size of wound.   COPD (chronic obstructive pulmonary disease) (HCC)    agent orange exposure   Degenerative disk disease    "everywhere" (04/28/2012)   Diverticulitis    Dysrhythmia    afib   Elevated uric acid in blood    takes Allopurinol daily   Emphysema of lung (HCC)    Enlarged prostate    but not on any meds   GERD (gastroesophageal reflux disease)    takes Protonix daily   H/O hiatal hernia    Headache    History of pneumonia    a. 2010   Hyperlipidemia    takes Lipitor daily; pt. states he takes a preventive   Hypothyroidism    Joint pain    Joint swelling    Nausea and vomiting 11/12/2022   Neuromuscular disorder (HCC)    bilat legs, and bilat arms   Osteoporosis 2012   PAF (paroxysmal atrial fibrillation) (HCC)    a.  Dx 04/2012, CHA2DS2VASc = 1 (age);  b. 04/2012 Echo: EF 40-50%, mild MR.   Personal history of colonic adenomas 02/09/2013   Pneumonia    Shortness of breath dyspnea    Thyroid disease    no taking meds-caused dizziness    Patient Active Problem List   Diagnosis Date Noted   Closed fracture of ramus of right pubis (HCC) 04/07/2023   S/P PICC central line placement 03/31/2023   Venous insufficiency of both lower extremities 03/20/2023   MSSA bacteremia 03/19/2023   Sepsis (HCC) 03/18/2023   Chronic superficial venous thrombosis of both lower extremities 11/18/2022   Nausea and vomiting 11/12/2022   Venous stasis ulcer (HCC) 09/13/2022   Primary osteoarthritis of right shoulder, status post left shoulder arthroplasty 07/05/2022   Shingles 05/16/2022   Olecranon bursitis, right elbow 04/04/2022   Vertigo 04/04/2022   Orthostatic hypotension 03/21/2022   BPH (benign prostatic hyperplasia) 02/21/2022   Annual physical exam 01/22/2022   Charcot's joint of foot, right 01/22/2022   Lower extremity edema 08/08/2021   Heartburn    Atypical chest pain    Cervical radicular pain 06/11/2021   Chronic anticoagulation 03/15/2021   Peripheral neuropathy 02/22/2021   Chronic systolic heart  failure (HCC) 02/22/2021   Essential (primary) hypertension 11/15/2020   Arthritis of carpometacarpal [facility]) joint of right thumb 08/07/2020   Cellulitis 03/22/2020   COPD (chronic obstructive pulmonary disease) (HCC) 03/21/2020   Chest wall pain 09/21/2019   GERD (gastroesophageal reflux disease) status post Nissen fundoplication 12/03/2017   Pneumothorax, left 04/11/2017   Lumbar stenosis status post T12-S1 fusion 04/07/2017   History of total right hip arthroplasty 07/31/2015   Primary osteoarthritis of right knee 06/17/2014   Thrush of mouth and esophagus (HCC) 04/03/2014   Palpitation 03/28/2014   Hyperlipidemia    History of colonic polyps 02/09/2013   Paroxysmal atrial fibrillation (HCC)  05/11/2012   Osteoarthritis of left knee 01/10/2012   Seasonal and perennial allergic rhinitis 07/23/2010   Asthma, moderate persistent 03/06/2009    Past Surgical History:  Procedure Laterality Date   32 HOUR PH STUDY N/A 05/30/2021   Procedure: 24 HOUR PH STUDY;  Surgeon: Shellia Cleverly, DO;  Location: [facility];  Service: Gastroenterology;  Laterality: N/A;   ANTERIOR CERVICAL DECOMP/DISCECTOMY FUSION N/A 07/21/2012   Procedure: ANTERIOR CERVICAL DECOMPRESSION/DISCECTOMY FUSION 2 LEVELS;  Surgeon: Barnett Abu, MD;  Location: [facility];  Service: Neurosurgery;  Laterality: N/A;  C4-5 C5-6 Anterior cervical decompression/diskectomy/fusion   ANTERIOR LAT LUMBAR FUSION N/A 04/07/2017   Procedure: Thoracic twelve-Lumbar one Anterolateral decompression/fusion;  Surgeon: Barnett Abu, MD;  Location: [facility] OR;  Service: Neurosurgery;  Laterality: N/A;   ARTHRODESIS FOOT WITH WEIL OSTEOTOMY Left 11/07/2022   Procedure: LEFT THIRD METATARSOPHALANGEAL CAPSULOTOMY, WEIL OSTEOTOMY;  Surgeon: Terance Hart, MD;  Location: [facility] OR;  Service: Orthopedics;  Laterality: Left;  LENGTH OF SURGERY: 60 MINUTES   BACK SURGERY     x 3   CARDIAC CATHETERIZATION  11/2008   Dr. Anne Fu - 20% calcified non flow limiting left main, 50% EF apical hypokinesis   CERVICAL DISC ARTHROPLASTY N/A 04/07/2017   Procedure: Cervical six-seven Disc arthroplasty;  Surgeon: Barnett Abu, MD;  Location: [facility] OR;  Service: Neurosurgery;  Laterality: N/A;   CHEST TUBE INSERTION Left 04/11/2017   Procedure: CHEST TUBE INSERTION;  Surgeon: Kerin Perna, MD;  Location: [facility] OR;  Service: Thoracic;  Laterality: Left;   CHOLECYSTECTOMY     COLONOSCOPY  2016   CG-MAC-miralax(good)servere TICS/TA x 2   ESOPHAGEAL MANOMETRY N/A 05/30/2021   Procedure: ESOPHAGEAL MANOMETRY (EM);  Surgeon: Shellia Cleverly, DO;  Location: [facility];  Service: Gastroenterology;  Laterality: N/A;  ph impedence   ESOPHAGOGASTRODUODENOSCOPY      EYE SURGERY Bilateral 2011   steroidal encapsulation" (04/28/2012)   FOOT SURGERY Right    x 2   JOINT REPLACEMENT     Many surgeries including 5 joit replacements   KNEE ARTHROSCOPY Bilateral    LATERAL / POSTERIOR COMBINED FUSION LUMBAR SPINE  2012   LEFT ATRIAL APPENDAGE OCCLUSION N/A 02/22/2021   Procedure: LEFT ATRIAL APPENDAGE OCCLUSION;  Surgeon: Lanier Prude, MD;  [facility];  Service: Cardiovascular;  Laterality: N/A;   POLYPECTOMY  2016   severe TICS/TA x 2   POSTERIOR CERVICAL LAMINECTOMY WITH MET- RX Right 06/11/2021   Procedure: Right Cervical six-seven  Laminectomy and foraminotomy with metrex;  Surgeon: Barnett Abu, MD;  Location: [facility] OR;  Service: Neurosurgery;  Laterality: Right;   REVERSE SHOULDER ARTHROPLASTY  04/28/2012   Procedure: REVERSE SHOULDER ARTHROPLASTY;  Surgeon: Mable Paris, MD;  Location: [facility] OR;  Service: Orthopedics;  Laterality: Left;  Left shouder reverse total shoulder arthroplasty   SHOULDER  SURGERY     SPINE SURGERY     I have had 7 spinal surgeries   TEE WITHOUT CARDIOVERSION N/A 02/22/2021   Procedure: TRANSESOPHAGEAL ECHOCARDIOGRAM (TEE);  Surgeon: Lanier Prude, MD;  Location: [facility] INVASIVE CV LAB;  Service: Cardiovascular;  Laterality: N/A;   TENDON REPAIR Right 08/07/2020   Procedure: RIGHT HAND LIGAMENT RECONSTRUCTION AND TENDON INTERPOSITION;  Surgeon: Jodi Geralds, MD;  Location: [facility];  Service: Orthopedics;  Laterality: Right;   TONSILLECTOMY AND ADENOIDECTOMY  1954   TOTAL HIP ARTHROPLASTY Left 2011   "left" (04/28/2012)   TOTAL HIP ARTHROPLASTY Right 07/31/2015   Procedure: TOTAL HIP ARTHROPLASTY ANTERIOR APPROACH;  Surgeon: Jodi Geralds, MD;  Location: [facility];  Service: Orthopedics;  Laterality: Right;   TOTAL KNEE ARTHROPLASTY  01/10/2012   Procedure: TOTAL KNEE ARTHROPLASTY;  Surgeon: Harvie Junior, MD;  Location: [facility] OR;  Service: Orthopedics;;  left total knee arthroplasty    TOTAL KNEE ARTHROPLASTY Right 06/17/2014   Procedure: TOTAL KNEE ARTHROPLASTY;  Surgeon: Harvie Junior, MD;  Location: [facility] OR;  Service: Orthopedics;  Laterality: Right;   toupet fundolplication  11/01/2021   TRANSESOPHAGEAL ECHOCARDIOGRAM (CATH LAB) N/A 03/24/2023   Procedure: TRANSESOPHAGEAL ECHOCARDIOGRAM;  Surgeon: Chrystie Nose, MD;  [facility];  Service: Cardiovascular;  Laterality: N/A;       Home Medications    Prior to Admission medications   Medication Sig Start Date End Date Taking? Authorizing Provider  doxycycline (VIBRAMYCIN) 100 MG capsule Take 1 capsule (100 mg total) by mouth 2 (two) times daily for 10 days. 04/11/23 04/21/23 Yes Trevor Iha, FNP  albuterol (VENTOLIN HFA) 108 (90 Base) MCG/ACT inhaler INHALE 2 PUFFS BY MOUTH EVERY 4 HOURS AS NEEDED FOR WHEEZE OR FOR SHORTNESS OF BREATH 01/14/23   Jetty Duhamel D, MD  allopurinol (ZYLOPRIM) 100 MG tablet Take 1 tablet (100 mg total) by mouth in the morning. 11/25/22   Monica Becton, MD  atorvastatin (LIPITOR) 20 MG tablet TAKE 1 TABLET BY MOUTH EVERY DAY 09/17/22   Jake Bathe, MD  cefadroxil (DURICEF) 500 MG capsule Take 2 capsules (1,000 mg total) by mouth 2 (two) times daily for 10 days. 04/01/23 04/11/23  Veryl Speak, FNP  cyclobenzaprine (FLEXERIL) 10 MG tablet Take 1 tablet (10 mg total) by mouth at bedtime. 03/12/23   Monica Becton, MD  diclofenac Sodium (VOLTAREN) 1 % GEL Apply 2 g topically 3 (three) times daily as needed (pain.).    [provider]  diltiazem (CARDIZEM CD) 180 MG 24 hr capsule TAKE 1 CAPSULE BY MOUTH EVERY DAY 10/16/22   Jake Bathe, MD  diltiazem (CARDIZEM) 30 MG tablet TAKE 1 TABLET (30 MG TOTAL) BY MOUTH AS NEEDED (HEART RATE OVER 120). 03/10/23   Jake Bathe, MD  DULoxetine (CYMBALTA) 30 MG capsule Take 1 capsule (30 mg total) by mouth daily. 12/24/22   Lomax, Amy, NP  ELIQUIS 5 MG TABS tablet TAKE 1 TABLET BY MOUTH TWICE A DAY 01/28/23    Jake Bathe, MD  ipratropium-albuterol (DUONEB) 0.5-2.5 (3) MG/3ML SOLN 1 NEBULE EVERY 6 HOURS AS NEEDED. **J45.3** Patient taking differently: Take 3 mLs by nebulization every 6 (six) hours as needed (for shortness of breath and wheezing). 03/22/20   Jetty Duhamel D, MD  isosorbide mononitrate (IMDUR) 30 MG 24 hr tablet TAKE 1 TABLET BY MOUTH EVERY DAY 03/10/23   Jake Bathe, MD  leptospermum manuka honey (MEDIHONEY) PSTE paste Apply 1 Application  topically daily. 03/25/23   Jonah Blue, MD  Nebulizers (COMPRESSOR/NEBULIZER) MISC Use as directed 12/01/17   Waymon Budge, MD  Olodaterol HCl (STRIVERDI RESPIMAT) 2.5 MCG/ACT AERS INHALE 2 PUFFS BY MOUTH INTO THE LUNGS DAILY 03/10/23   Jetty Duhamel D, MD  ondansetron (ZOFRAN-ODT) 8 MG disintegrating tablet Take 1 tablet (8 mg total) by mouth every 8 (eight) hours as needed for nausea. 04/07/23   Monica Becton, MD  oxyCODONE-acetaminophen (PERCOCET) 10-325 MG tablet Take 1 tablet by mouth every 8 (eight) hours as needed for pain. 04/07/23   Monica Becton, MD  pantoprazole (PROTONIX) 40 MG tablet TAKE 1 TABLET (40 MG TOTAL) BY MOUTH TWICE A DAY BEFORE MEALS 11/04/22   Monica Becton, MD  predniSONE (DELTASONE) 10 MG tablet Take 1 tablet (10 mg total) by mouth in the morning and at bedtime. 10/30/22   Waymon Budge, MD  pregabalin (LYRICA) 75 MG capsule Take 1 capsule (75 mg total) by mouth 3 (three) times daily. Patient taking differently: Take 75 mg by mouth 2 (two) times daily. 05/16/22   Monica Becton, MD  tamsulosin (FLOMAX) 0.4 MG CAPS capsule Take 0.4 mg by mouth at bedtime.    [provider]    Family History Family History  Problem Relation Age of Onset   Diabetes Mother    Heart disease Father        Died 70, MI   Cancer Father    Hyperlipidemia Father    Stroke Father    Prostate cancer Brother    Colon polyps Brother    Colon polyps Brother    Asthma Brother    COPD Brother     Colon cancer Brother        Dx age 38   Cancer Brother    Colon cancer Other    Esophageal cancer Neg Hx    Rectal cancer Neg Hx    Stomach cancer Neg Hx    Pancreatic cancer Neg Hx    Kidney disease Neg Hx    Liver disease Neg Hx    Neuropathy Neg Hx     Social History Social History   Tobacco Use   Smoking status: Never   Smokeless tobacco: Never  Vaping Use   Vaping status: Never Used  Substance Use Topics   Alcohol use: No   Drug use: No     Allergies   Levaquin [levofloxacin]   Review of Systems Review of Systems  Skin:  Positive for wound.  All other systems reviewed and are negative.    Physical Exam Triage Vital Signs ED Triage Vitals  Encounter Vitals Group     BP      Systolic BP Percentile      Diastolic BP Percentile      Pulse      Resp      Temp      Temp src      SpO2      Weight      Height      Head Circumference      Peak Flow      Pain Score      Pain Loc      Pain Education      Exclude from Growth Chart    No data found.  Updated Vital Signs BP 109/77   Pulse 91   Temp 98.5 F (36.9 C)   Resp 19   SpO2 98%    Physical Exam Vitals and nursing  note reviewed.  Constitutional:      General: He is not in acute distress.    Appearance: Normal appearance. He is normal weight. He is not ill-appearing.  HENT:     Head: Normocephalic and atraumatic.     Mouth/Throat:     Mouth: Mucous membranes are moist.     Pharynx: Oropharynx is clear.  Eyes:     Extraocular Movements: Extraocular movements intact.     Conjunctiva/sclera: Conjunctivae normal.     Pupils: Pupils are equal, round, and reactive to light.  Cardiovascular:     Rate and Rhythm: Normal rate and regular rhythm.     Pulses: Normal pulses.     Heart sounds: Normal heart sounds.  Pulmonary:     Effort: Pulmonary effort is normal.     Breath sounds: Normal breath sounds. No wheezing, rhonchi or rales.  Musculoskeletal:        General: Normal range of  motion.     Cervical back: Normal range of motion and neck supple.  Skin:    General: Skin is warm and dry.     Comments: Right lower leg (anterior aspect): Obvious skin avulsion laceration noted-please see image below, second image captured is of previous right lower leg wound sustained previously.  Neurological:     General: No focal deficit present.     Mental Status: He is alert and oriented to person, place, and time.  Psychiatric:        Mood and Affect: Mood normal.        Behavior: Behavior normal.        Thought Content: Thought content normal.         UC Treatments / Results  Labs (all labs ordered are listed, but only abnormal results are displayed) Labs Reviewed - No data to display  EKG   Radiology No results found.  Procedures Procedures (including critical care time)  Medications Ordered in UC Medications - No data to display  Initial Impression / Assessment and Plan / UC Course  I have reviewed the triage vital signs and the nursing notes.  Pertinent labs & imaging results that were available during my care of the patient were reviewed by me and considered in my medical decision making (see chart for details).     MDM: 1.  Laceration of right lower leg, initial encounter-Advised patient/wife to leave wound area open is much as possible to allow healing by secondary intention/scab formation.  Advised patient to take medication as directed with food to completion.  Encouraged to increase daily water intake to 64 ounces per day while taking this medication.  2.  Open wound of right lower leg, initial encounter wounds covered with nonadherent Telfa followed by Kerlix.  Specific right lower leg wound care instructions given to wife and patient with additional supplies prior to discharge. Advised patient/wife to leave wound area open is much as possible to allow healing by secondary intention/scab formation.  Advised patient to take medication as directed with  food to completion.  Encouraged to increase daily water intake to 64 ounces per day while taking this medication.  Advised patient to follow-up here on Monday, 04/14/2023 for wound check.  Patient discharged home, hemodynamically stable. Final Clinical Impressions(s) / UC Diagnoses   Final diagnoses:  Laceration of right lower leg, initial encounter  Open wound of right lower leg, initial encounter     Discharge Instructions      Advised patient/wife to leave wound area open is much as  possible to allow healing by secondary intention/scab formation.  Advised patient to take medication as directed with food to completion.  Encouraged to increase daily water intake to 64 ounces per day while taking this medication.  Advised patient to follow-up here on Monday, 04/14/2023 for wound check.     ED Prescriptions     Medication Sig Dispense Auth. Provider   doxycycline (VIBRAMYCIN) 100 MG capsule Take 1 capsule (100 mg total) by mouth 2 (two) times daily for 10 days. 20 capsule Trevor Iha, FNP      PDMP not reviewed this encounter.   Trevor Iha, FNP 04/11/23 1038

## 2023-04-11 NOTE — ED Triage Notes (Addendum)
Pt presents to uc with co of laceration to right leg last night  from hitting leg on open oven door. with recent hospitalization due to sepsis from previous wound to right leg. Pt reports he cleaned wound with saline and put meta honey on it and applied dressing to wound.

## 2023-04-11 NOTE — Discharge Instructions (Addendum)
Advised patient/wife to leave wound area open is much as possible to allow healing by secondary intention/scab formation.  Advised patient to take medication as directed with food to completion.  Encouraged to increase daily water intake to 64 ounces per day while taking this medication.  Advised patient to follow-up here on Monday, 04/14/2023 for wound check.

## 2023-04-14 ENCOUNTER — Other Ambulatory Visit: Payer: Self-pay

## 2023-04-14 ENCOUNTER — Ambulatory Visit
Admission: EM | Admit: 2023-04-14 | Discharge: 2023-04-14 | Disposition: A | Discharge status: Home / Self Care | Payer: Medicare Other | Attending: Family Medicine | Admitting: Family Medicine

## 2023-04-14 DIAGNOSIS — S81812A Laceration without foreign body, left lower leg, initial encounter: Secondary | ICD-10-CM

## 2023-04-14 DIAGNOSIS — S81811D Laceration without foreign body, right lower leg, subsequent encounter: Secondary | ICD-10-CM

## 2023-04-14 NOTE — ED Triage Notes (Addendum)
Here for follow up for laceration to right leg. Was told to come back Monday to see how wound was healing.

## 2023-04-14 NOTE — Discharge Instructions (Signed)
Cleanse daily and apply skin cream with honey Keep covered See Dr T in follow up in a couple of weeks

## 2023-04-14 NOTE — ED Provider Notes (Signed)
[name]    CSN: [id number] Arrival date & time: 04/14/23  0802      History   Chief Complaint Chief Complaint  Patient presents with   Follow-up    HPI AVREY TANAKA is a 77 y.o. male.   HPI  Patient states that he is steroids for many years because of his other medical problems.  Because of this his skin is quite thin and frail.  In addition he has bilateral pedal edema from his heart disease.  This makes the skin of his lower legs susceptible to injury.  He came to be seen here couple of days ago for a skin tear on his right shin.  He is here today complaining that he also has a skin tear on his left shin that is new, in addition to wanting his left shin evaluated.  He states that these wounds take a long time to heal.  He has been seen by his primary care doctor and to the wound clinic.  His wife is here with him.  She is proficient at wound care  Past Medical History:  Diagnosis Date   Agent orange exposure 1970's   takes Imdur,Diltiazem, and Atacand daily   ALLERGIC RHINITIS    Allergy    seasonal allergies   Asthma    uses inhaler and nebulizers   Bruises easily    d/t prednisone daily   Cataract    CHF (congestive heart failure) (HCC)    dx-on meds to treat   Chronic bronchitis (HCC)    "used to get it q yr; last time ?2008" (04/28/2012)   Clotting disorder (HCC) 2019   On blood thinner when I bleed it is for a consideral amount of time. This is tegardless of size of wound.   COPD (chronic obstructive pulmonary disease) (HCC)    agent orange exposure   Degenerative disk disease    "everywhere" (04/28/2012)   Diverticulitis    Dysrhythmia    afib   Elevated uric acid in blood    takes Allopurinol daily   Emphysema of lung (HCC)    Enlarged prostate    but not on any meds   GERD (gastroesophageal reflux disease)    takes Protonix daily   H/O hiatal hernia    Headache    History of pneumonia    a. 2010   Hyperlipidemia    takes Lipitor  daily; pt. states he takes a preventive   Hypothyroidism    Joint pain    Joint swelling    Nausea and vomiting 11/12/2022   Neuromuscular disorder (HCC)    bilat legs, and bilat arms   Osteoporosis 2012   PAF (paroxysmal atrial fibrillation) (HCC)    a. Dx 04/2012, CHA2DS2VASc = 1 (age);  b. 04/2012 Echo: EF 40-50%, mild MR.   Personal history of colonic adenomas 02/09/2013   Pneumonia    Shortness of breath dyspnea    Thyroid disease    no taking meds-caused dizziness    Patient Active Problem List   Diagnosis Date Noted   Closed fracture of ramus of right pubis (HCC) 04/07/2023   S/P PICC central line placement 03/31/2023   Venous insufficiency of both lower extremities 03/20/2023   MSSA bacteremia 03/19/2023   Sepsis (HCC) 03/18/2023   Chronic superficial venous thrombosis of both lower extremities 11/18/2022   Nausea and vomiting 11/12/2022   Venous stasis ulcer (HCC) 09/13/2022   Primary osteoarthritis of right shoulder, status post left shoulder arthroplasty 07/05/2022  Shingles 05/16/2022   Olecranon bursitis, right elbow 04/04/2022   Vertigo 04/04/2022   Orthostatic hypotension 03/21/2022   BPH (benign prostatic hyperplasia) 02/21/2022   Annual physical exam 01/22/2022   Charcot's joint of foot, right 01/22/2022   Lower extremity edema 08/08/2021   Heartburn    Atypical chest pain    Cervical radicular pain 06/11/2021   Chronic anticoagulation 03/15/2021   Peripheral neuropathy 02/22/2021   Chronic systolic heart failure (HCC) 02/22/2021   Essential (primary) hypertension 11/15/2020   Arthritis of carpometacarpal (CMC) joint of right thumb 08/07/2020   Cellulitis 03/22/2020   COPD (chronic obstructive pulmonary disease) (HCC) 03/21/2020   Chest wall pain 09/21/2019   GERD (gastroesophageal reflux disease) status post Nissen fundoplication 12/03/2017   Pneumothorax, left 04/11/2017   Lumbar stenosis status post T12-S1 fusion 04/07/2017   History of total  right hip arthroplasty 07/31/2015   Primary osteoarthritis of right knee 06/17/2014   Thrush of mouth and esophagus (HCC) 04/03/2014   Palpitation 03/28/2014   Hyperlipidemia    History of colonic polyps 02/09/2013   Paroxysmal atrial fibrillation (HCC) 05/11/2012   Osteoarthritis of left knee 01/10/2012   Seasonal and perennial allergic rhinitis 07/23/2010   Asthma, moderate persistent 03/06/2009    Past Surgical History:  Procedure Laterality Date   43 HOUR PH STUDY N/A 05/30/2021   Procedure: 24 HOUR PH STUDY;  Surgeon: Shellia Cleverly, DO;  Location: [facility];  Service: Gastroenterology;  Laterality: N/A;   ANTERIOR CERVICAL DECOMP/DISCECTOMY FUSION N/A 07/21/2012   Procedure: ANTERIOR CERVICAL DECOMPRESSION/DISCECTOMY FUSION 2 LEVELS;  Surgeon: Barnett Abu, MD;  Location: [facility];  Service: Neurosurgery;  Laterality: N/A;  C4-5 C5-6 Anterior cervical decompression/diskectomy/fusion   ANTERIOR LAT LUMBAR FUSION N/A 04/07/2017   Procedure: Thoracic twelve-Lumbar one Anterolateral decompression/fusion;  Surgeon: Barnett Abu, MD;  Location: [facility];  Service: Neurosurgery;  Laterality: N/A;   ARTHRODESIS FOOT WITH WEIL OSTEOTOMY Left 11/07/2022   Procedure: LEFT THIRD METATARSOPHALANGEAL CAPSULOTOMY, WEIL OSTEOTOMY;  Surgeon: Terance Hart, MD;  Location: [facility] OR;  Service: Orthopedics;  Laterality: Left;  LENGTH OF SURGERY: 60 MINUTES   BACK SURGERY     x 3   CARDIAC CATHETERIZATION  11/2008   Dr. Anne Fu - 20% calcified non flow limiting left main, 50% EF apical hypokinesis   CERVICAL DISC ARTHROPLASTY N/A 04/07/2017   Procedure: Cervical six-seven Disc arthroplasty;  Surgeon: Barnett Abu, MD;  Location: [facility] OR;  Service: Neurosurgery;  Laterality: N/A;   CHEST TUBE INSERTION Left 04/11/2017   Procedure: CHEST TUBE INSERTION;  Surgeon: Kerin Perna, MD;  Location: [facility] OR;  Service: Thoracic;  Laterality: Left;   CHOLECYSTECTOMY     COLONOSCOPY  2016    CG-MAC-miralax(good)servere TICS/TA x 2   ESOPHAGEAL MANOMETRY N/A 05/30/2021   Procedure: ESOPHAGEAL MANOMETRY (EM);  Surgeon: Shellia Cleverly, DO;  Location: [facility];  Service: Gastroenterology;  Laterality: N/A;  ph impedence   ESOPHAGOGASTRODUODENOSCOPY     EYE SURGERY Bilateral 2011   steroidal encapsulation" (04/28/2012)   FOOT SURGERY Right    x 2   JOINT REPLACEMENT     Many surgeries including 5 joit replacements   KNEE ARTHROSCOPY Bilateral    LATERAL / POSTERIOR COMBINED FUSION LUMBAR SPINE  2012   LEFT ATRIAL APPENDAGE OCCLUSION N/A 02/22/2021   Procedure: LEFT ATRIAL APPENDAGE OCCLUSION;  Surgeon: Lanier Prude, MD;  [facility];  Service: Cardiovascular;  Laterality: N/A;   POLYPECTOMY  2016   severe TICS/TA x 2  POSTERIOR CERVICAL LAMINECTOMY WITH MET- RX Right 06/11/2021   Procedure: Right Cervical six-seven  Laminectomy and foraminotomy with metrex;  Surgeon: Barnett Abu, MD;  Location: [facility] OR;  Service: Neurosurgery;  Laterality: Right;   REVERSE SHOULDER ARTHROPLASTY  04/28/2012   Procedure: REVERSE SHOULDER ARTHROPLASTY;  Surgeon: Mable Paris, MD;  Location: [facility] OR;  Service: Orthopedics;  Laterality: Left;  Left shouder reverse total shoulder arthroplasty   SHOULDER SURGERY     SPINE SURGERY     I have had 7 spinal surgeries   TEE WITHOUT CARDIOVERSION N/A 02/22/2021   Procedure: TRANSESOPHAGEAL ECHOCARDIOGRAM (TEE);  Surgeon: Lanier Prude, MD;  Location: [facility] CV LAB;  Service: Cardiovascular;  Laterality: N/A;   TENDON REPAIR Right 08/07/2020   Procedure: RIGHT HAND LIGAMENT RECONSTRUCTION AND TENDON INTERPOSITION;  Surgeon: Jodi Geralds, MD;  Location: [facility];  Service: Orthopedics;  Laterality: Right;   TONSILLECTOMY AND ADENOIDECTOMY  1954   TOTAL HIP ARTHROPLASTY Left 2011   "left" (04/28/2012)   TOTAL HIP ARTHROPLASTY Right 07/31/2015   Procedure: TOTAL HIP ARTHROPLASTY ANTERIOR  APPROACH;  Surgeon: Jodi Geralds, MD;  Location: [facility];  Service: Orthopedics;  Laterality: Right;   TOTAL KNEE ARTHROPLASTY  01/10/2012   Procedure: TOTAL KNEE ARTHROPLASTY;  Surgeon: Harvie Junior, MD;  Location: [facility];  Service: Orthopedics;;  left total knee arthroplasty   TOTAL KNEE ARTHROPLASTY Right 06/17/2014   Procedure: TOTAL KNEE ARTHROPLASTY;  Surgeon: Harvie Junior, MD;  Location: [facility];  Service: Orthopedics;  Laterality: Right;   toupet fundolplication  11/01/2021   TRANSESOPHAGEAL ECHOCARDIOGRAM (CATH LAB) N/A 03/24/2023   Procedure: TRANSESOPHAGEAL ECHOCARDIOGRAM;  Surgeon: Chrystie Nose, MD;  Location: [facility];  Service: Cardiovascular;  Laterality: N/A;       Home Medications    Prior to Admission medications   Medication Sig Start Date End Date Taking? Authorizing Provider  albuterol (VENTOLIN HFA) 108 (90 Base) MCG/ACT inhaler INHALE 2 PUFFS BY MOUTH EVERY 4 HOURS AS NEEDED FOR WHEEZE OR FOR SHORTNESS OF BREATH 01/14/23   Jetty Duhamel D, MD  allopurinol (ZYLOPRIM) 100 MG tablet Take 1 tablet (100 mg total) by mouth in the morning. 11/25/22   Monica Becton, MD  atorvastatin (LIPITOR) 20 MG tablet TAKE 1 TABLET BY MOUTH EVERY DAY 09/17/22   Jake Bathe, MD  cyclobenzaprine (FLEXERIL) 10 MG tablet Take 1 tablet (10 mg total) by mouth at bedtime. 03/12/23   Monica Becton, MD  diclofenac Sodium (VOLTAREN) 1 % GEL Apply 2 g topically 3 (three) times daily as needed (pain.).    [provider]  diltiazem (CARDIZEM CD) 180 MG 24 hr capsule TAKE 1 CAPSULE BY MOUTH EVERY DAY 10/16/22   Jake Bathe, MD  diltiazem (CARDIZEM) 30 MG tablet TAKE 1 TABLET (30 MG TOTAL) BY MOUTH AS NEEDED (HEART RATE OVER 120). 03/10/23   Jake Bathe, MD  doxycycline (VIBRAMYCIN) 100 MG capsule Take 1 capsule (100 mg total) by mouth 2 (two) times daily for 10 days. 04/11/23 04/21/23  Trevor Iha, FNP  DULoxetine (CYMBALTA) 30 MG capsule Take 1 capsule (30  mg total) by mouth daily. 12/24/22   Lomax, Amy, NP  ELIQUIS 5 MG TABS tablet TAKE 1 TABLET BY MOUTH TWICE A DAY 01/28/23   Jake Bathe, MD  ipratropium-albuterol (DUONEB) 0.5-2.5 (3) MG/3ML SOLN 1 NEBULE EVERY 6 HOURS AS NEEDED. **J45.3** Patient taking differently: Take 3 mLs by nebulization every 6 (six) hours as needed (  for shortness of breath and wheezing). 03/22/20   Jetty Duhamel D, MD  isosorbide mononitrate (IMDUR) 30 MG 24 hr tablet TAKE 1 TABLET BY MOUTH EVERY DAY 03/10/23   Jake Bathe, MD  leptospermum manuka honey (MEDIHONEY) PSTE paste Apply 1 Application topically daily. 03/25/23   Jonah Blue, MD  Nebulizers (COMPRESSOR/NEBULIZER) MISC Use as directed 12/01/17   Waymon Budge, MD  Olodaterol HCl (STRIVERDI RESPIMAT) 2.5 MCG/ACT AERS INHALE 2 PUFFS BY MOUTH INTO THE LUNGS DAILY 03/10/23   Jetty Duhamel D, MD  ondansetron (ZOFRAN-ODT) 8 MG disintegrating tablet Take 1 tablet (8 mg total) by mouth every 8 (eight) hours as needed for nausea. 04/07/23   Monica Becton, MD  oxyCODONE-acetaminophen (PERCOCET) 10-325 MG tablet Take 1 tablet by mouth every 8 (eight) hours as needed for pain. 04/07/23   Monica Becton, MD  pantoprazole (PROTONIX) 40 MG tablet TAKE 1 TABLET (40 MG TOTAL) BY MOUTH TWICE A DAY BEFORE MEALS 11/04/22   Monica Becton, MD  predniSONE (DELTASONE) 10 MG tablet Take 1 tablet (10 mg total) by mouth in the morning and at bedtime. 10/30/22   Waymon Budge, MD  pregabalin (LYRICA) 75 MG capsule Take 1 capsule (75 mg total) by mouth 3 (three) times daily. Patient taking differently: Take 75 mg by mouth 2 (two) times daily. 05/16/22   Monica Becton, MD  tamsulosin (FLOMAX) 0.4 MG CAPS capsule Take 0.4 mg by mouth at bedtime.    [provider]    Family History Family History  Problem Relation Age of Onset   Diabetes Mother    Heart disease Father        Died 68, MI   Cancer Father    Hyperlipidemia Father     Stroke Father    Prostate cancer Brother    Colon polyps Brother    Colon polyps Brother    Asthma Brother    COPD Brother    Colon cancer Brother        Dx age 53   Cancer Brother    Colon cancer Other    Esophageal cancer Neg Hx    Rectal cancer Neg Hx    Stomach cancer Neg Hx    Pancreatic cancer Neg Hx    Kidney disease Neg Hx    Liver disease Neg Hx    Neuropathy Neg Hx     Social History Social History   Tobacco Use   Smoking status: Never   Smokeless tobacco: Never  Vaping Use   Vaping status: Never Used  Substance Use Topics   Alcohol use: No   Drug use: No     Allergies   Levaquin [levofloxacin]   Review of Systems Review of Systems See HPI  Physical Exam Triage Vital Signs ED Triage Vitals  Encounter Vitals Group     BP 04/14/23 0808 115/72     Systolic BP Percentile --      Diastolic BP Percentile --      Pulse Rate 04/14/23 0808 94     Resp 04/14/23 0808 16     Temp 04/14/23 0808 97.8 F (36.6 C)     Temp Source 04/14/23 0808 Oral     SpO2 04/14/23 0808 99 %     Weight --      Height --      Head Circumference --      Peak Flow --      Pain Score 04/14/23 0811 5  Pain Loc --      Pain Education --      Exclude from Growth Chart --    No data found.  Updated Vital Signs BP 115/72   Pulse 94   Temp 97.8 F (36.6 C) (Oral)   Resp 16   SpO2 99%       Physical Exam Constitutional:      General: He is not in acute distress.    Appearance: He is well-developed.  HENT:     Head: Normocephalic and atraumatic.  Eyes:     Conjunctiva/sclera: Conjunctivae normal.     Pupils: Pupils are equal, round, and reactive to light.  Cardiovascular:     Rate and Rhythm: Normal rate.  Pulmonary:     Effort: Pulmonary effort is normal. No respiratory distress.  Abdominal:     General: There is no distension.     Palpations: Abdomen is soft.  Musculoskeletal:        General: Normal range of motion.     Cervical back: Normal range of  motion.     Right lower leg: Edema present.     Left lower leg: Edema present.       Legs:  Skin:    General: Skin is warm and dry.  Neurological:     Mental Status: He is alert.      UC Treatments / Results  Labs (all labs ordered are listed, but only abnormal results are displayed) Labs Reviewed - No data to display  EKG   Radiology No results found.  Procedures Procedures (including critical care time)  Medications Ordered in UC Medications - No data to display  Initial Impression / Assessment and Plan / UC Course  I have reviewed the triage vital signs and the nursing notes.  Pertinent labs & imaging results that were available during my care of the patient were reviewed by me and considered in my medical decision making (see chart for details).     The wife has quite about wound care.  She states different providers have told her different methods of wound healing.  I told her her best bet is to follow the instructions given to her by the wound clinic.  This involves cleaning every day or so, applying a Medihoney product and cover.  I recommend they go back to the wound clinic with these 3 wounds to try to help get them cleaned up.  Follow-up with your PCP Final Clinical Impressions(s) / UC Diagnoses   Final diagnoses:  Laceration of left lower leg, initial encounter  Laceration of skin of right lower leg, subsequent encounter     Discharge Instructions      Cleanse daily and apply skin cream with honey Keep covered See Dr T in follow up in a couple of weeks   ED Prescriptions   None    PDMP not reviewed this encounter.   Eustace Moore, MD 04/14/23 1430

## 2023-04-17 ENCOUNTER — Encounter: Payer: Self-pay | Admitting: Emergency Medicine

## 2023-04-17 ENCOUNTER — Other Ambulatory Visit: Payer: Self-pay

## 2023-04-17 ENCOUNTER — Ambulatory Visit
Admission: EM | Admit: 2023-04-17 | Discharge: 2023-04-17 | Disposition: A | Discharge status: Home / Self Care | Payer: Medicare Other | Attending: Family Medicine | Admitting: Family Medicine

## 2023-04-17 ENCOUNTER — Encounter (HOSPITAL_COMMUNITY): Payer: Self-pay

## 2023-04-17 ENCOUNTER — Inpatient Hospital Stay (HOSPITAL_COMMUNITY)
Admission: EM | Admit: 2023-04-17 | Discharge: 2023-04-23 | DRG: 872 | Disposition: A | Discharge status: Home / Self Care | Payer: Medicare Other | Attending: Internal Medicine | Admitting: Internal Medicine

## 2023-04-17 DIAGNOSIS — N400 Enlarged prostate without lower urinary tract symptoms: Secondary | ICD-10-CM | POA: Diagnosis present

## 2023-04-17 DIAGNOSIS — J4540 Moderate persistent asthma, uncomplicated: Secondary | ICD-10-CM | POA: Diagnosis present

## 2023-04-17 DIAGNOSIS — T502X5A Adverse effect of carbonic-anhydrase inhibitors, benzothiadiazides and other diuretics, initial encounter: Secondary | ICD-10-CM | POA: Diagnosis not present

## 2023-04-17 DIAGNOSIS — Z83438 Family history of other disorder of lipoprotein metabolism and other lipidemia: Secondary | ICD-10-CM

## 2023-04-17 DIAGNOSIS — I10 Essential (primary) hypertension: Secondary | ICD-10-CM | POA: Diagnosis not present

## 2023-04-17 DIAGNOSIS — I5042 Chronic combined systolic (congestive) and diastolic (congestive) heart failure: Secondary | ICD-10-CM | POA: Diagnosis present

## 2023-04-17 DIAGNOSIS — B965 Pseudomonas (aeruginosa) (mallei) (pseudomallei) as the cause of diseases classified elsewhere: Secondary | ICD-10-CM | POA: Diagnosis present

## 2023-04-17 DIAGNOSIS — Z96653 Presence of artificial knee joint, bilateral: Secondary | ICD-10-CM | POA: Diagnosis present

## 2023-04-17 DIAGNOSIS — J439 Emphysema, unspecified: Secondary | ICD-10-CM | POA: Diagnosis present

## 2023-04-17 DIAGNOSIS — Z9181 History of falling: Secondary | ICD-10-CM

## 2023-04-17 DIAGNOSIS — M109 Gout, unspecified: Secondary | ICD-10-CM | POA: Diagnosis present

## 2023-04-17 DIAGNOSIS — Y92009 Unspecified place in unspecified non-institutional (private) residence as the place of occurrence of the external cause: Secondary | ICD-10-CM | POA: Diagnosis not present

## 2023-04-17 DIAGNOSIS — Z86718 Personal history of other venous thrombosis and embolism: Secondary | ICD-10-CM | POA: Diagnosis not present

## 2023-04-17 DIAGNOSIS — E785 Hyperlipidemia, unspecified: Secondary | ICD-10-CM | POA: Diagnosis present

## 2023-04-17 DIAGNOSIS — Z8701 Personal history of pneumonia (recurrent): Secondary | ICD-10-CM

## 2023-04-17 DIAGNOSIS — M7989 Other specified soft tissue disorders: Secondary | ICD-10-CM | POA: Diagnosis not present

## 2023-04-17 DIAGNOSIS — D72829 Elevated white blood cell count, unspecified: Secondary | ICD-10-CM | POA: Diagnosis not present

## 2023-04-17 DIAGNOSIS — I480 Paroxysmal atrial fibrillation: Secondary | ICD-10-CM | POA: Diagnosis not present

## 2023-04-17 DIAGNOSIS — G629 Polyneuropathy, unspecified: Secondary | ICD-10-CM

## 2023-04-17 DIAGNOSIS — I951 Orthostatic hypotension: Secondary | ICD-10-CM | POA: Diagnosis not present

## 2023-04-17 DIAGNOSIS — Z96612 Presence of left artificial shoulder joint: Secondary | ICD-10-CM | POA: Diagnosis present

## 2023-04-17 DIAGNOSIS — N401 Enlarged prostate with lower urinary tract symptoms: Secondary | ICD-10-CM

## 2023-04-17 DIAGNOSIS — G6289 Other specified polyneuropathies: Secondary | ICD-10-CM

## 2023-04-17 DIAGNOSIS — E876 Hypokalemia: Secondary | ICD-10-CM | POA: Diagnosis present

## 2023-04-17 DIAGNOSIS — Z8619 Personal history of other infectious and parasitic diseases: Secondary | ICD-10-CM | POA: Diagnosis not present

## 2023-04-17 DIAGNOSIS — S81811D Laceration without foreign body, right lower leg, subsequent encounter: Secondary | ICD-10-CM | POA: Diagnosis not present

## 2023-04-17 DIAGNOSIS — Z7901 Long term (current) use of anticoagulants: Secondary | ICD-10-CM

## 2023-04-17 DIAGNOSIS — L03116 Cellulitis of left lower limb: Secondary | ICD-10-CM | POA: Diagnosis not present

## 2023-04-17 DIAGNOSIS — E7800 Pure hypercholesterolemia, unspecified: Secondary | ICD-10-CM | POA: Diagnosis not present

## 2023-04-17 DIAGNOSIS — E872 Acidosis, unspecified: Secondary | ICD-10-CM | POA: Diagnosis not present

## 2023-04-17 DIAGNOSIS — E440 Moderate protein-calorie malnutrition: Secondary | ICD-10-CM | POA: Diagnosis not present

## 2023-04-17 DIAGNOSIS — J438 Other emphysema: Secondary | ICD-10-CM | POA: Diagnosis not present

## 2023-04-17 DIAGNOSIS — E039 Hypothyroidism, unspecified: Secondary | ICD-10-CM | POA: Diagnosis present

## 2023-04-17 DIAGNOSIS — I878 Other specified disorders of veins: Secondary | ICD-10-CM | POA: Diagnosis present

## 2023-04-17 DIAGNOSIS — Z8614 Personal history of Methicillin resistant Staphylococcus aureus infection: Secondary | ICD-10-CM | POA: Diagnosis not present

## 2023-04-17 DIAGNOSIS — Z881 Allergy status to other antibiotic agents status: Secondary | ICD-10-CM

## 2023-04-17 DIAGNOSIS — L89151 Pressure ulcer of sacral region, stage 1: Secondary | ICD-10-CM | POA: Diagnosis not present

## 2023-04-17 DIAGNOSIS — A419 Sepsis, unspecified organism: Secondary | ICD-10-CM | POA: Diagnosis not present

## 2023-04-17 DIAGNOSIS — L0390 Cellulitis, unspecified: Secondary | ICD-10-CM | POA: Diagnosis present

## 2023-04-17 DIAGNOSIS — S81812D Laceration without foreign body, left lower leg, subsequent encounter: Secondary | ICD-10-CM | POA: Diagnosis not present

## 2023-04-17 DIAGNOSIS — I5022 Chronic systolic (congestive) heart failure: Secondary | ICD-10-CM | POA: Diagnosis present

## 2023-04-17 DIAGNOSIS — T380X5A Adverse effect of glucocorticoids and synthetic analogues, initial encounter: Secondary | ICD-10-CM | POA: Diagnosis present

## 2023-04-17 DIAGNOSIS — I110 Hypertensive heart disease with heart failure: Secondary | ICD-10-CM | POA: Diagnosis present

## 2023-04-17 DIAGNOSIS — K219 Gastro-esophageal reflux disease without esophagitis: Secondary | ICD-10-CM | POA: Diagnosis present

## 2023-04-17 DIAGNOSIS — M810 Age-related osteoporosis without current pathological fracture: Secondary | ICD-10-CM | POA: Diagnosis present

## 2023-04-17 DIAGNOSIS — M79606 Pain in leg, unspecified: Secondary | ICD-10-CM | POA: Diagnosis present

## 2023-04-17 DIAGNOSIS — M48062 Spinal stenosis, lumbar region with neurogenic claudication: Secondary | ICD-10-CM | POA: Diagnosis not present

## 2023-04-17 DIAGNOSIS — L03115 Cellulitis of right lower limb: Secondary | ICD-10-CM | POA: Diagnosis present

## 2023-04-17 DIAGNOSIS — Z825 Family history of asthma and other chronic lower respiratory diseases: Secondary | ICD-10-CM

## 2023-04-17 DIAGNOSIS — M48061 Spinal stenosis, lumbar region without neurogenic claudication: Secondary | ICD-10-CM | POA: Diagnosis present

## 2023-04-17 DIAGNOSIS — L03114 Cellulitis of left upper limb: Secondary | ICD-10-CM | POA: Diagnosis not present

## 2023-04-17 DIAGNOSIS — Z8249 Family history of ischemic heart disease and other diseases of the circulatory system: Secondary | ICD-10-CM

## 2023-04-17 DIAGNOSIS — W228XXA Striking against or struck by other objects, initial encounter: Secondary | ICD-10-CM | POA: Diagnosis present

## 2023-04-17 DIAGNOSIS — N179 Acute kidney failure, unspecified: Secondary | ICD-10-CM | POA: Diagnosis not present

## 2023-04-17 DIAGNOSIS — M14671 Charcot's joint, right ankle and foot: Secondary | ICD-10-CM

## 2023-04-17 DIAGNOSIS — D689 Coagulation defect, unspecified: Secondary | ICD-10-CM | POA: Diagnosis present

## 2023-04-17 DIAGNOSIS — Z7952 Long term (current) use of systemic steroids: Secondary | ICD-10-CM

## 2023-04-17 DIAGNOSIS — R5381 Other malaise: Secondary | ICD-10-CM | POA: Diagnosis not present

## 2023-04-17 DIAGNOSIS — Z79899 Other long term (current) drug therapy: Secondary | ICD-10-CM

## 2023-04-17 DIAGNOSIS — Z981 Arthrodesis status: Secondary | ICD-10-CM

## 2023-04-17 DIAGNOSIS — I872 Venous insufficiency (chronic) (peripheral): Secondary | ICD-10-CM | POA: Diagnosis present

## 2023-04-17 DIAGNOSIS — Z96643 Presence of artificial hip joint, bilateral: Secondary | ICD-10-CM | POA: Diagnosis present

## 2023-04-17 DIAGNOSIS — J449 Chronic obstructive pulmonary disease, unspecified: Secondary | ICD-10-CM | POA: Diagnosis present

## 2023-04-17 LAB — COMPREHENSIVE METABOLIC PANEL
ALT: 14 [IU]/L (ref 0–44)
ALT: 17 U/L (ref 0–44)
AST: 20 [IU]/L (ref 0–40)
AST: 21 U/L (ref 15–41)
Albumin: 4 g/dL (ref 3.8–4.8)
Albumin: 3.1 g/dL — ABNORMAL LOW (ref 3.5–5.0)
Alkaline Phosphatase: 116 [IU]/L (ref 44–121)
Alkaline Phosphatase: 83 U/L (ref 38–126)
Anion gap: 9 (ref 5–15)
BUN/Creatinine Ratio: 18 (ref 10–24)
BUN: 16 mg/dL (ref 8–27)
BUN: 15 mg/dL (ref 8–23)
Bilirubin Total: 0.7 mg/dL (ref 0.0–1.2)
CO2: 25 mmol/L (ref 20–29)
CO2: 24 mmol/L (ref 22–32)
Calcium: 9.8 mg/dL (ref 8.6–10.2)
Calcium: 9 mg/dL (ref 8.9–10.3)
Chloride: 99 mmol/L (ref 96–106)
Chloride: 102 mmol/L (ref 98–111)
Creatinine, Ser: 0.91 mg/dL (ref 0.76–1.27)
Creatinine, Ser: 0.94 mg/dL (ref 0.61–1.24)
GFR, Estimated: >60 mL/min (ref >60)
Globulin, Total: 1.9 g/dL (ref 1.5–4.5)
Glucose, Bld: 127 mg/dL — ABNORMAL HIGH (ref 70–99)
Glucose: 105 mg/dL — ABNORMAL HIGH (ref 70–99)
Potassium: 5.1 mmol/L (ref 3.5–5.2)
Potassium: 3.5 mmol/L (ref 3.5–5.1)
Sodium: 136 mmol/L (ref 134–144)
Sodium: 135 mmol/L (ref 135–145)
Total Bilirubin: 1.1 mg/dL (ref <1.2)
Total Protein: 5.9 g/dL — ABNORMAL LOW (ref 6.0–8.5)
Total Protein: 5.8 g/dL — ABNORMAL LOW (ref 6.5–8.1)
eGFR: 87 mL/min/{1.73_m2} (ref >59)

## 2023-04-17 LAB — CBC WITH DIFFERENTIAL/PLATELET
Abs Immature Granulocytes: 0.45 10*3/uL — ABNORMAL HIGH (ref 0.00–0.07)
Basophils Absolute: 0 10*3/uL (ref 0.0–0.2)
Basophils Absolute: 0 10*3/uL (ref 0.0–0.1)
Basophils Relative: 0 %
Basos: 0 %
EOS (ABSOLUTE): 0 10*3/uL (ref 0.0–0.4)
Eos: 0 %
Eosinophils Absolute: 0 10*3/uL (ref 0.0–0.5)
Eosinophils Relative: 0 %
HCT: 37.4 % — ABNORMAL LOW (ref 39.0–52.0)
Hematocrit: 38.2 % (ref 37.5–51.0)
Hemoglobin: 12.5 g/dL — ABNORMAL LOW (ref 13.0–17.7)
Hemoglobin: 11.7 g/dL — ABNORMAL LOW (ref 13.0–17.0)
Immature Granulocytes: 2 %
Immature Granulocytes: 2 %
Lymphocytes Absolute: 1.4 10*3/uL (ref 0.7–3.1)
Lymphocytes Relative: 10 %
Lymphs Abs: 1.9 10*3/uL (ref 0.7–4.0)
Lymphs: 7 %
MCH: 30.8 pg (ref 26.6–33.0)
MCH: 30.2 pg (ref 26.0–34.0)
MCHC: 32.7 g/dL (ref 31.5–35.7)
MCHC: 31.3 g/dL (ref 30.0–36.0)
MCV: 94 fL (ref 79–97)
MCV: 96.6 fL (ref 80.0–100.0)
Monocytes Absolute: 1.6 10*3/uL — ABNORMAL HIGH (ref 0.1–0.9)
Monocytes Absolute: 1.3 10*3/uL — ABNORMAL HIGH (ref 0.1–1.0)
Monocytes Relative: 7 %
Monocytes: 8 %
Neutro Abs: 16.1 10*3/uL — ABNORMAL HIGH (ref 1.7–7.7)
Neutrophils Absolute: 17.6 10*3/uL — ABNORMAL HIGH (ref 1.4–7.0)
Neutrophils Relative %: 81 %
Neutrophils: 83 %
Platelets: 202 10*3/uL (ref 150–450)
Platelets: 179 10*3/uL (ref 150–400)
RBC: 4.06 x10E6/uL — ABNORMAL LOW (ref 4.14–5.80)
RBC: 3.87 MIL/uL — ABNORMAL LOW (ref 4.22–5.81)
RDW: 17.4 % — ABNORMAL HIGH (ref 11.6–15.4)
RDW: 17.6 % — ABNORMAL HIGH (ref 11.5–15.5)
WBC: 21 10*3/uL (ref 3.4–10.8)
WBC: 19.8 10*3/uL — ABNORMAL HIGH (ref 4.0–10.5)
nRBC: 0 % (ref 0.0–0.2)

## 2023-04-17 LAB — URINALYSIS, W/ REFLEX TO CULTURE (INFECTION SUSPECTED)
Bacteria, UA: NONE SEEN
Bilirubin Urine: NEGATIVE
Glucose, UA: NEGATIVE mg/dL
Ketones, ur: NEGATIVE mg/dL
Nitrite: NEGATIVE
Protein, ur: NEGATIVE mg/dL
Specific Gravity, Urine: 1.013 (ref 1.005–1.030)
pH: 6 (ref 5.0–8.0)

## 2023-04-17 LAB — I-STAT CG4 LACTIC ACID, ED
Lactic Acid, Venous: 1.5 mmol/L (ref 0.5–1.9)
Lactic Acid, Venous: 1 mmol/L (ref 0.5–1.9)

## 2023-04-17 MED ORDER — SODIUM CHLORIDE 0.9 % IV BOLUS
1000.0000 mL | Freq: Once | INTRAVENOUS | Status: AC
  Administered 2023-04-17: 1000 mL via INTRAVENOUS

## 2023-04-17 MED ORDER — TAMSULOSIN HCL 0.4 MG PO CAPS
0.4000 mg | ORAL_CAPSULE | Freq: Every day | ORAL | Status: DC
  Administered 2023-04-17 – 2023-04-22 (×6): 0.4 mg via ORAL
  Filled 2023-04-17 (×6): qty 1

## 2023-04-17 MED ORDER — ARFORMOTEROL TARTRATE 15 MCG/2ML IN NEBU
15.0000 ug | INHALATION_SOLUTION | Freq: Two times a day (BID) | RESPIRATORY_TRACT | Status: DC
  Administered 2023-04-17 – 2023-04-23 (×12): 15 ug via RESPIRATORY_TRACT
  Filled 2023-04-17 (×13): qty 2

## 2023-04-17 MED ORDER — DULOXETINE HCL 30 MG PO CPEP
30.0000 mg | ORAL_CAPSULE | Freq: Every day | ORAL | Status: DC
  Administered 2023-04-18 – 2023-04-23 (×6): 30 mg via ORAL
  Filled 2023-04-17 (×6): qty 1

## 2023-04-17 MED ORDER — ACETAMINOPHEN 325 MG PO TABS
325.0000 mg | ORAL_TABLET | Freq: Once | ORAL | Status: AC
  Administered 2023-04-17: 325 mg via ORAL
  Filled 2023-04-17: qty 1

## 2023-04-17 MED ORDER — ALLOPURINOL 100 MG PO TABS
100.0000 mg | ORAL_TABLET | Freq: Every morning | ORAL | Status: DC
  Administered 2023-04-18 – 2023-04-23 (×6): 100 mg via ORAL
  Filled 2023-04-17 (×7): qty 1

## 2023-04-17 MED ORDER — ATORVASTATIN CALCIUM 10 MG PO TABS
20.0000 mg | ORAL_TABLET | Freq: Every day | ORAL | Status: DC
  Administered 2023-04-18 – 2023-04-23 (×6): 20 mg via ORAL
  Filled 2023-04-17 (×6): qty 2

## 2023-04-17 MED ORDER — ALBUTEROL SULFATE HFA 108 (90 BASE) MCG/ACT IN AERS: 1.0000 | INHALATION_SPRAY | RESPIRATORY_TRACT | Status: DC | PRN

## 2023-04-17 MED ORDER — PANTOPRAZOLE SODIUM 40 MG PO TBEC
40.0000 mg | DELAYED_RELEASE_TABLET | Freq: Two times a day (BID) | ORAL | Status: DC
  Administered 2023-04-17 – 2023-04-23 (×12): 40 mg via ORAL
  Filled 2023-04-17 (×12): qty 1

## 2023-04-17 MED ORDER — DILTIAZEM HCL ER COATED BEADS 180 MG PO CP24
180.0000 mg | ORAL_CAPSULE | Freq: Every day | ORAL | Status: DC
  Administered 2023-04-18 – 2023-04-23 (×6): 180 mg via ORAL
  Filled 2023-04-17 (×6): qty 1

## 2023-04-17 MED ORDER — APIXABAN 5 MG PO TABS
5.0000 mg | ORAL_TABLET | Freq: Two times a day (BID) | ORAL | Status: DC
  Administered 2023-04-17 – 2023-04-23 (×12): 5 mg via ORAL
  Filled 2023-04-17 (×12): qty 1

## 2023-04-17 MED ORDER — ACETAMINOPHEN 325 MG PO TABS
650.0000 mg | ORAL_TABLET | Freq: Four times a day (QID) | ORAL | Status: DC | PRN
  Administered 2023-04-18 – 2023-04-20 (×4): 650 mg via ORAL
  Filled 2023-04-17 (×4): qty 2

## 2023-04-17 MED ORDER — SODIUM CHLORIDE 0.9 % IV SOLN
2.0000 g | INTRAVENOUS | Status: DC
  Administered 2023-04-18 – 2023-04-20 (×3): 2 g via INTRAVENOUS
  Filled 2023-04-17 (×3): qty 20

## 2023-04-17 MED ORDER — ALBUTEROL SULFATE (2.5 MG/3ML) 0.083% IN NEBU: 2.5000 mg | INHALATION_SOLUTION | RESPIRATORY_TRACT | Status: DC | PRN

## 2023-04-17 MED ORDER — ACETAMINOPHEN 650 MG RE SUPP: 650.0000 mg | Freq: Four times a day (QID) | RECTAL | Status: DC | PRN

## 2023-04-17 MED ORDER — ISOSORBIDE MONONITRATE ER 30 MG PO TB24
30.0000 mg | ORAL_TABLET | Freq: Every day | ORAL | Status: DC
  Administered 2023-04-18 – 2023-04-23 (×6): 30 mg via ORAL
  Filled 2023-04-17 (×6): qty 1

## 2023-04-17 MED ORDER — SODIUM CHLORIDE 0.9% FLUSH
3.0000 mL | Freq: Two times a day (BID) | INTRAVENOUS | Status: DC
  Administered 2023-04-17 – 2023-04-23 (×12): 3 mL via INTRAVENOUS

## 2023-04-17 MED ORDER — OXYCODONE HCL 5 MG PO TABS
5.0000 mg | ORAL_TABLET | ORAL | Status: DC | PRN
  Administered 2023-04-17 – 2023-04-22 (×8): 5 mg via ORAL
  Filled 2023-04-17 (×8): qty 1

## 2023-04-17 MED ORDER — SODIUM CHLORIDE 0.9 % IV SOLN
1.0000 g | Freq: Once | INTRAVENOUS | Status: AC
  Administered 2023-04-17: 1 g via INTRAVENOUS
  Filled 2023-04-17: qty 10

## 2023-04-17 MED ORDER — PREGABALIN 75 MG PO CAPS
75.0000 mg | ORAL_CAPSULE | Freq: Two times a day (BID) | ORAL | Status: DC
  Administered 2023-04-17 – 2023-04-21 (×8): 75 mg via ORAL
  Filled 2023-04-17 (×8): qty 1

## 2023-04-17 MED ORDER — POLYETHYLENE GLYCOL 3350 17 G PO PACK
17.0000 g | PACK | Freq: Every day | ORAL | Status: DC | PRN
Start: 2023-04-17 — End: 2023-04-23

## 2023-04-17 NOTE — ED Notes (Signed)
Called [facility] labs have not been picked up yet. Although I requested stat pick up @ 9:15 am.

## 2023-04-17 NOTE — Discharge Instructions (Addendum)
Advised patient we will follow-up with lab results once received.  Advised patient/wife if symptoms worsen please go to [facility], ED for further evaluation and possible IV antibiotics.

## 2023-04-17 NOTE — H&P (Signed)
History and Physical   Levi [name] WGN:[id number] DOB: [birth year] DOA: 04/17/2023  PCP: Monica Becton, MD   Patient coming from: Home/urgent care  Chief Complaint: Leg wounds, cellulitis  HPI: Levi [name] is a 77 y.o. male with medical history significant of hypertension, hyperlipidemia, GERD, A-fib, neuropathy, venous insufficiency, superficial venous thrombosis, asthma, COPD, BPH, CHF, lumbar stenosis presenting with recurrent cellulitis.  Patient has history of venous insufficiency and venous stasis ulcers as above.  Was admitted last month for cellulitis/MSSA bacteremia/sepsis.  Antibiotics de-escalated to Ancef prior to discharge and discharged with IV antibiotics through PICC line.  He improved and finished this course 2 weeks ago, afterwards he was placed on p.o. cefadroxil for 10 days which he completed 5 days ago.[name]  He subsequently hit his leg on the oven door and then the other leg he struck on something else and created new wounds which now have evidence of worsening cellulitis with erythema, warmth, redness.  Was seen in urgent care and tried on a week of doxycycline without improvement.  Followed up at urgent care today when sent to the ED for further evaluation.  Reports fever to 100.5 at home.  No fever here.  Denies chills, chest pain, shortness of breath, abdominal pain, constipation, diarrhea, nausea, vomiting.  ED Course: Vital signs in ED notable for heart rate as high as the 120s but improved to the 100s by the time of admission.  Lab workup included CMP with glucose 127, protein 5.8, albumin 3.1.  CBC with leukocytosis to 19.8, hemoglobin stable 11.7.  Lactic acid normal x 2.  Aerobic wound cultures ordered in the ED.  Blood cultures pending.  Urinalysis with hemoglobin, leukocytes but no bacteria.  No imaging in the ED.  Patient received ceftriaxone, liter of fluids, Tylenol in the ED.  Review of Systems: As per HPI otherwise all other systems reviewed and are  negative.  Past Medical History:  Diagnosis Date   Agent orange exposure 1970's   takes Imdur,Diltiazem, and Atacand daily   ALLERGIC RHINITIS    Allergy    seasonal allergies   Asthma    uses inhaler and nebulizers   Bruises easily    d/t prednisone daily   Cataract    Cellulitis 03/22/2020   Chest wall pain 09/21/2019   L scapula, reported 4/27 21/     CHF (congestive heart failure) (HCC)    dx-on meds to treat   Chronic bronchitis (HCC)    "used to get it q yr; last time ?2008" (04/28/2012)   Clotting disorder (HCC) 2019   On blood thinner when I bleed it is for a consideral amount of time. This is tegardless of size of wound.   COPD (chronic obstructive pulmonary disease) (HCC)    agent orange exposure   Degenerative disk disease    "everywhere" (04/28/2012)   Diverticulitis    Dysrhythmia    afib   Elevated uric acid in blood    takes Allopurinol daily   Emphysema of lung (HCC)    Enlarged prostate    but not on any meds   GERD (gastroesophageal reflux disease)    takes Protonix daily   H/O hiatal hernia    Headache    History of pneumonia    a. 2010   Hyperlipidemia    takes Lipitor daily; pt. states he takes a preventive   Hypothyroidism    Joint pain    Joint swelling    MSSA bacteremia 03/19/2023   Nausea and  vomiting 11/12/2022   Neuromuscular disorder (HCC)    bilat legs, and bilat arms   Osteoporosis 2012   PAF (paroxysmal atrial fibrillation) (HCC)    a. Dx 04/2012, CHA2DS2VASc = 1 (age);  b. 04/2012 Echo: EF 40-50%, mild MR.   Personal history of colonic adenomas 02/09/2013   Pneumonia    Sepsis (HCC) 03/18/2023   Shortness of breath dyspnea    Thyroid disease    no taking meds-caused dizziness    Past Surgical History:  Procedure Laterality Date   56 HOUR PH STUDY N/A 05/30/2021   Procedure: 24 HOUR PH STUDY;  Surgeon: Shellia Cleverly, DO;  Location: [facility];  Service: Gastroenterology;  Laterality: N/A;   ANTERIOR CERVICAL  DECOMP/DISCECTOMY FUSION N/A 07/21/2012   Procedure: ANTERIOR CERVICAL DECOMPRESSION/DISCECTOMY FUSION 2 LEVELS;  Surgeon: Barnett Abu, MD;  Location: [facility];  Service: Neurosurgery;  Laterality: N/A;  C4-5 C5-6 Anterior cervical decompression/diskectomy/fusion   ANTERIOR LAT LUMBAR FUSION N/A 04/07/2017   Procedure: Thoracic twelve-Lumbar one Anterolateral decompression/fusion;  Surgeon: Barnett Abu, MD;  Location: [facility];  Service: Neurosurgery;  Laterality: N/A;   ARTHRODESIS FOOT WITH WEIL OSTEOTOMY Left 11/07/2022   Procedure: LEFT THIRD METATARSOPHALANGEAL CAPSULOTOMY, WEIL OSTEOTOMY;  Surgeon: Terance Hart, MD;  Location[facility];  Service: Orthopedics;  Laterality: Left;  LENGTH OF SURGERY: 60 MINUTES   BACK SURGERY     x 3   CARDIAC CATHETERIZATION  11/2008   Dr. Anne Fu - 20% calcified non flow limiting left main, 50% EF apical hypokinesis   CERVICAL DISC ARTHROPLASTY N/A 04/07/2017   Procedure: Cervical six-seven Disc arthroplasty;  Surgeon: Barnett Abu, MD;  Location: [facility] OR;  Service: Neurosurgery;  Laterality: N/A;   CHEST TUBE INSERTION Left 04/11/2017   Procedure: CHEST TUBE INSERTION;  Surgeon: Kerin Perna, MD;  Location: [facility] OR;  Service: Thoracic;  Laterality: Left;   CHOLECYSTECTOMY     COLONOSCOPY  2016   CG-MAC-miralax(good)servere TICS/TA x 2   ESOPHAGEAL MANOMETRY N/A 05/30/2021   Procedure: ESOPHAGEAL MANOMETRY (EM);  Surgeon: Shellia Cleverly, DO;  Location: [facility];  Service: Gastroenterology;  Laterality: N/A;  ph impedence   ESOPHAGOGASTRODUODENOSCOPY     EYE SURGERY Bilateral 2011   steroidal encapsulation" (04/28/2012)   FOOT SURGERY Right    x 2   JOINT REPLACEMENT     Many surgeries including 5 joit replacements   KNEE ARTHROSCOPY Bilateral    LATERAL / POSTERIOR COMBINED FUSION LUMBAR SPINE  2012   LEFT ATRIAL APPENDAGE OCCLUSION N/A 02/22/2021   Procedure: LEFT ATRIAL APPENDAGE OCCLUSION;  Surgeon: Lanier Prude, MD;   [facility];  Service: Cardiovascular;  Laterality: N/A;   POLYPECTOMY  2016   severe TICS/TA x 2   POSTERIOR CERVICAL LAMINECTOMY WITH MET- RX Right 06/11/2021   Procedure: Right Cervical six-seven  Laminectomy and foraminotomy with metrex;  Surgeon: Barnett Abu, MD;  Location: [facility] OR;  Service: Neurosurgery;  Laterality: Right;   REVERSE SHOULDER ARTHROPLASTY  04/28/2012   Procedure: REVERSE SHOULDER ARTHROPLASTY;  Surgeon: Mable Paris, MD;  Location: [facility] OR;  Service: Orthopedics;  Laterality: Left;  Left shouder reverse total shoulder arthroplasty   SHOULDER SURGERY     SPINE SURGERY     I have had 7 spinal surgeries   TEE WITHOUT CARDIOVERSION N/A 02/22/2021   Procedure: TRANSESOPHAGEAL ECHOCARDIOGRAM (TEE);  Surgeon: Lanier Prude, MD;  Location: [facility] INVASIVE CV LAB;  Service: Cardiovascular;  Laterality: N/A;   TENDON REPAIR Right 08/07/2020  Procedure: RIGHT HAND LIGAMENT RECONSTRUCTION AND TENDON INTERPOSITION;  Surgeon: Jodi Geralds, MD;  Location: [facility];  Service: Orthopedics;  Laterality: Right;   TONSILLECTOMY AND ADENOIDECTOMY  1954   TOTAL HIP ARTHROPLASTY Left 2011   "left" (04/28/2012)   TOTAL HIP ARTHROPLASTY Right 07/31/2015   Procedure: TOTAL HIP ARTHROPLASTY ANTERIOR APPROACH;  Surgeon: Jodi Geralds, MD;  Location: [facility];  Service: Orthopedics;  Laterality: Right;   TOTAL KNEE ARTHROPLASTY  01/10/2012   Procedure: TOTAL KNEE ARTHROPLASTY;  Surgeon: Harvie Junior, MD;  Location: [facility];  Service: Orthopedics;;  left total knee arthroplasty   TOTAL KNEE ARTHROPLASTY Right 06/17/2014   Procedure: TOTAL KNEE ARTHROPLASTY;  Surgeon: Harvie Junior, MD;  Location: [facility];  Service: Orthopedics;  Laterality: Right;   toupet fundolplication  11/01/2021   TRANSESOPHAGEAL ECHOCARDIOGRAM (CATH LAB) N/A 03/24/2023   Procedure: TRANSESOPHAGEAL ECHOCARDIOGRAM;  Surgeon: Chrystie Nose, MD;  Location: [facility];  Service:  Cardiovascular;  Laterality: N/A;    Social History  reports that he has never smoked. He has never used smokeless tobacco. He reports that he does not drink alcohol and does not use drugs.  Allergies  Allergen Reactions   Levaquin [Levofloxacin] Itching    Family History  Problem Relation Age of Onset   Diabetes Mother    Heart disease Father        Died 65, MI   Cancer Father    Hyperlipidemia Father    Stroke Father    Prostate cancer Brother    Colon polyps Brother    Colon polyps Brother    Asthma Brother    COPD Brother    Colon cancer Brother        Dx age 53   Cancer Brother    Colon cancer Other    Esophageal cancer Neg Hx    Rectal cancer Neg Hx    Stomach cancer Neg Hx    Pancreatic cancer Neg Hx    Kidney disease Neg Hx    Liver disease Neg Hx    Neuropathy Neg Hx   Reviewed on admission  Prior to Admission medications   Medication Sig Start Date End Date Taking? Authorizing Provider  albuterol (VENTOLIN HFA) 108 (90 Base) MCG/ACT inhaler INHALE 2 PUFFS BY MOUTH EVERY 4 HOURS AS NEEDED FOR WHEEZE OR FOR SHORTNESS OF BREATH 01/14/23   Jetty Duhamel D, MD  allopurinol (ZYLOPRIM) 100 MG tablet Take 1 tablet (100 mg total) by mouth in the morning. 11/25/22   Monica Becton, MD  atorvastatin (LIPITOR) 20 MG tablet TAKE 1 TABLET BY MOUTH EVERY DAY 09/17/22   Jake Bathe, MD  cyclobenzaprine (FLEXERIL) 10 MG tablet Take 1 tablet (10 mg total) by mouth at bedtime. 03/12/23   Monica Becton, MD  diclofenac Sodium (VOLTAREN) 1 % GEL Apply 2 g topically 3 (three) times daily as needed (pain.).    [provider]  diltiazem (CARDIZEM CD) 180 MG 24 hr capsule TAKE 1 CAPSULE BY MOUTH EVERY DAY 10/16/22   Jake Bathe, MD  diltiazem (CARDIZEM) 30 MG tablet TAKE 1 TABLET (30 MG TOTAL) BY MOUTH AS NEEDED (HEART RATE OVER 120). 03/10/23   Jake Bathe, MD  DULoxetine (CYMBALTA) 30 MG capsule Take 1 capsule (30 mg total) by mouth daily. 12/24/22    Lomax, Amy, NP  ELIQUIS 5 MG TABS tablet TAKE 1 TABLET BY MOUTH TWICE A DAY 01/28/23   Jake Bathe, MD  ipratropium-albuterol (DUONEB)  0.5-2.5 (3) MG/3ML SOLN 1 NEBULE EVERY 6 HOURS AS NEEDED. **J45.3** Patient taking differently: Take 3 mLs by nebulization every 6 (six) hours as needed (for shortness of breath and wheezing). 03/22/20   Jetty Duhamel D, MD  isosorbide mononitrate (IMDUR) 30 MG 24 hr tablet TAKE 1 TABLET BY MOUTH EVERY DAY 03/10/23   Jake Bathe, MD  leptospermum manuka honey (MEDIHONEY) PSTE paste Apply 1 Application topically daily. 03/25/23   Jonah Blue, MD  Nebulizers (COMPRESSOR/NEBULIZER) MISC Use as directed 12/01/17   Waymon Budge, MD  Olodaterol HCl (STRIVERDI RESPIMAT) 2.5 MCG/ACT AERS INHALE 2 PUFFS BY MOUTH INTO THE LUNGS DAILY 03/10/23   Jetty Duhamel D, MD  ondansetron (ZOFRAN-ODT) 8 MG disintegrating tablet Take 1 tablet (8 mg total) by mouth every 8 (eight) hours as needed for nausea. 04/07/23   Monica Becton, MD  oxyCODONE-acetaminophen (PERCOCET) 10-325 MG tablet Take 1 tablet by mouth every 8 (eight) hours as needed for pain. 04/07/23   Monica Becton, MD  pantoprazole (PROTONIX) 40 MG tablet TAKE 1 TABLET (40 MG TOTAL) BY MOUTH TWICE A DAY BEFORE MEALS 11/04/22   Monica Becton, MD  predniSONE (DELTASONE) 10 MG tablet Take 1 tablet (10 mg total) by mouth in the morning and at bedtime. 10/30/22   Waymon Budge, MD  pregabalin (LYRICA) 75 MG capsule Take 1 capsule (75 mg total) by mouth 3 (three) times daily. Patient taking differently: Take 75 mg by mouth 2 (two) times daily. 05/16/22   Monica Becton, MD  tamsulosin (FLOMAX) 0.4 MG CAPS capsule Take 0.4 mg by mouth at bedtime.    [provider]    Physical Exam: Vitals:   04/17/23 1223 04/17/23 1226 04/17/23 1405  BP: 104/84  130/84  Pulse: (!) 122  (!) 101  Resp: 18  18  Temp: 98 F (36.7 C)    SpO2: 97%  98%  Weight:  87.5 kg   Height:  6\' 2"   (1.88 m)     Physical Exam Constitutional:      General: He is not in acute distress.    Appearance: Normal appearance.  HENT:     Head: Normocephalic and atraumatic.     Mouth/Throat:     Mouth: Mucous membranes are moist.     Pharynx: Oropharynx is clear.  Eyes:     Extraocular Movements: Extraocular movements intact.     Pupils: Pupils are equal, round, and reactive to light.  Cardiovascular:     Rate and Rhythm: Normal rate and regular rhythm.     Pulses: Normal pulses.     Heart sounds: Normal heart sounds.  Pulmonary:     Effort: Pulmonary effort is normal. No respiratory distress.     Breath sounds: Normal breath sounds.  Abdominal:     General: Bowel sounds are normal. There is no distension.     Palpations: Abdomen is soft.     Tenderness: There is no abdominal tenderness.  Musculoskeletal:        General: Swelling present. No deformity.     Right lower leg: Edema present.     Left lower leg: Edema present.     Comments: Bilateral lower extremity erythema, edema, warmth.  Skin:    General: Skin is warm and dry.  Neurological:     General: No focal deficit present.     Mental Status: Mental status is at baseline.          Labs on Admission: I have personally  reviewed following labs and imaging studies  CBC: Recent Labs  Lab 04/17/23 0909 04/17/23 1228  WBC 21.0* 19.8*  NEUTROABS 17.6* 16.1*  HGB 12.5* 11.7*  HCT 38.2 37.4*  MCV 94 96.6  PLT 202 179    Basic Metabolic Panel: Recent Labs  Lab 04/17/23 0909 04/17/23 1228  NA 136 135  K 5.1 3.5  CL 99 102  CO2 25 24  GLUCOSE 105* 127*  BUN 16 15  CREATININE 0.91 0.94  CALCIUM 9.8 9.0    GFR: Estimated Creatinine Clearance: 77.7 mL/min (by C-G formula based on SCr of 0.94 mg/dL).  Liver Function Tests: Recent Labs  Lab 04/17/23 0909 04/17/23 1228  AST 20 21  ALT 14 17  ALKPHOS 116 83  BILITOT 0.7 1.1  PROT 5.9* 5.8*  ALBUMIN 4.0 3.1*    Urine analysis:    Component Value  Date/Time   COLORURINE YELLOW 04/17/2023 1228   APPEARANCEUR CLEAR 04/17/2023 1228   APPEARANCEUR Clear 03/18/2023 1411   LABSPEC 1.013 04/17/2023 1228   PHURINE 6.0 04/17/2023 1228   GLUCOSEU NEGATIVE 04/17/2023 1228   HGBUR SMALL (A) 04/17/2023 1228   BILIRUBINUR NEGATIVE 04/17/2023 1228   BILIRUBINUR Negative 03/18/2023 1411   KETONESUR NEGATIVE 04/17/2023 1228   PROTEINUR NEGATIVE 04/17/2023 1228   UROBILINOGEN 0.2 05/02/2022 0916   UROBILINOGEN 0.2 04/21/2012 0936   NITRITE NEGATIVE 04/17/2023 1228   LEUKOCYTESUR TRACE (A) 04/17/2023 1228    Radiological Exams on Admission: No results found.  EKG: Not yet performed.  Assessment/Plan Active Problems:   Asthma, moderate persistent   Paroxysmal atrial fibrillation (HCC)   Hyperlipidemia   Lumbar stenosis status post T12-S1 fusion   GERD (gastroesophageal reflux disease) status post Nissen fundoplication   COPD (chronic obstructive pulmonary disease) (HCC)   Peripheral neuropathy   Chronic systolic heart failure (HCC)   Essential (primary) hypertension   BPH (benign prostatic hyperplasia)   Cellulitis   Cellulitis Venous stasis > Patient with known history of venous stasis and prior venous stasis wounds.  Admitted last month for cellulitis/MSSA bacteremia/sepsis.  Discharged on Ancef which He completed, followed by a course of p.o. cefadroxil which he also completed. > Has been improved but developed new wounds on legs after hitting oven door.  Now with worsening erythema, edema and warmth of those wounds. > Evaluated initially at urgent care and failed trial of outpatient doxycycline.  Had follow-up in urgent care today and sent to the ED for further evaluation. > Tachycardic in the ED but improving, now in the 100s.  Leukocytosis to 19.8.  Afebrile in the ED but did report fever to 100.5 at home.  Lactic acid negative x 2.  Wound cultures and blood cultures ordered in the ED.  Received ceftriaxone in the ED. - Monitor on  telemetry - Continue with ceftriaxone - Trend fever curve and WBC - Follow-up blood cultures, blood cultures - Wound care consult placed in the ED - As needed oxycodone for moderate to severe pain. - Supportive care  Hypertension - Continue home diltiazem and Imdur  Hyperlipidemia - Continue home atorvastatin  GERD - Continue home PPI  Paroxysmal atrial fibrillation - Continue home diltiazem, Eliquis  Neuropathy - Continue home Lyrica  History of superficial venous thrombosis - Anticoagulation not necessarily indicated but he is on Eliquis as above  COPD Asthma - Replace home olodaterol with formulary Brovana - Continue home as needed albuterol  BPH - Continue home tamsulosin  Lumbar stenosis - Noted  Chronic combined systolic and diastolic  CHF > Last echo in 2024 with EF 40-45%, G1 DD, normal RV function. - Not currently on a diuretic  DVT prophylaxis: Eliquis Code Status:   Full Family Communication:  Updated at bedside Disposition Plan:   Patient is from:  Home  Anticipated DC to:  Home  Anticipated DC date:  1 to 4 days  Anticipated DC barriers: None  Consults called:  None Admission status:  Observation, telemetry  Severity of Illness: The appropriate patient status for this patient is OBSERVATION. Observation status is judged to be reasonable and necessary in order to provide the required intensity of service to ensure the patient's safety. The patient's presenting symptoms, physical exam findings, and initial radiographic and laboratory data in the context of their medical condition is felt to place them at decreased risk for further clinical deterioration. Furthermore, it is anticipated that the patient will be medically stable for discharge from the hospital within 2 midnights of admission.    Synetta Fail MD [facility]  How to contact the [facility] Attending or Consulting provider 7A - 7P or covering provider during after hours 7P -7A, for  this patient?   Check the care team in [facility] and look for a) attending/consulting [facility] provider listed and b) the [facility] team listed Log into [email or web address] and use 's universal password to access. If you do not have the password, please contact the hospital operator. Locate the [facility] provider you are looking for under [facility] and page to a number that you can be directly reached. If you still have difficulty reaching the provider, please page the [facility] (Director on Call) for the Hospitalists listed on amion for assistance.  04/17/2023, 4:07 PM

## 2023-04-17 NOTE — ED Provider Notes (Signed)
[name]    CSN: [id number] Arrival date & time: 04/17/23  0801      History   Chief Complaint Chief Complaint  Patient presents with   Leg Pain    HPI Levi [name] is a 77 y.o. male.   HPI Pleasant 77 year old male presents for follow-up of right lower leg laceration.  He has been followed here by this clinic, his PCP, and wound care.  Patient/wife report new injury of left leg that was evaluated here on 04/14/2023.  Please see epic for that encounter note PMH significant for CHF, COPD, and venous insufficiency of both lower extremities, and PAF.  Patient is currently on apixaban and denies any unusual bleeding.  Patient is accompanied by his wife this morning.  Past Medical History:  Diagnosis Date   Agent orange exposure 1970's   takes Imdur,Diltiazem, and Atacand daily   ALLERGIC RHINITIS    Allergy    seasonal allergies   Asthma    uses inhaler and nebulizers   Bruises easily    d/t prednisone daily   Cataract    CHF (congestive heart failure) (HCC)    dx-on meds to treat   Chronic bronchitis (HCC)    "used to get it q yr; last time ?2008" (04/28/2012)   Clotting disorder (HCC) 2019   On blood thinner when I bleed it is for a consideral amount of time. This is tegardless of size of wound.   COPD (chronic obstructive pulmonary disease) (HCC)    agent orange exposure   Degenerative disk disease    "everywhere" (04/28/2012)   Diverticulitis    Dysrhythmia    afib   Elevated uric acid in blood    takes Allopurinol daily   Emphysema of lung (HCC)    Enlarged prostate    but not on any meds   GERD (gastroesophageal reflux disease)    takes Protonix daily   H/O hiatal hernia    Headache    History of pneumonia    a. 2010   Hyperlipidemia    takes Lipitor daily; pt. states he takes a preventive   Hypothyroidism    Joint pain    Joint swelling    Nausea and vomiting 11/12/2022   Neuromuscular disorder (HCC)    bilat legs, and bilat arms    Osteoporosis 2012   PAF (paroxysmal atrial fibrillation) (HCC)    a. Dx 04/2012, CHA2DS2VASc = 1 (age);  b. 04/2012 Echo: EF 40-50%, mild MR.   Personal history of colonic adenomas 02/09/2013   Pneumonia    Shortness of breath dyspnea    Thyroid disease    no taking meds-caused dizziness    Patient Active Problem List   Diagnosis Date Noted   Closed fracture of ramus of right pubis (HCC) 04/07/2023   S/P PICC central line placement 03/31/2023   Venous insufficiency of both lower extremities 03/20/2023   MSSA bacteremia 03/19/2023   Sepsis (HCC) 03/18/2023   Chronic superficial venous thrombosis of both lower extremities 11/18/2022   Nausea and vomiting 11/12/2022   Venous stasis ulcer (HCC) 09/13/2022   Primary osteoarthritis of right shoulder, status post left shoulder arthroplasty 07/05/2022   Shingles 05/16/2022   Olecranon bursitis, right elbow 04/04/2022   Vertigo 04/04/2022   Orthostatic hypotension 03/21/2022   BPH (benign prostatic hyperplasia) 02/21/2022   Annual physical exam 01/22/2022   Charcot's joint of foot, right 01/22/2022   Lower extremity edema 08/08/2021   Heartburn    Atypical chest pain  Cervical radicular pain 06/11/2021   Chronic anticoagulation 03/15/2021   Peripheral neuropathy 02/22/2021   Chronic systolic heart failure (HCC) 02/22/2021   Essential (primary) hypertension 11/15/2020   Arthritis of carpometacarpal [facility]) joint of right thumb 08/07/2020   Cellulitis 03/22/2020   COPD (chronic obstructive pulmonary disease) (HCC) 03/21/2020   Chest wall pain 09/21/2019   GERD (gastroesophageal reflux disease) status post Nissen fundoplication 12/03/2017   Pneumothorax, left 04/11/2017   Lumbar stenosis status post T12-S1 fusion 04/07/2017   History of total right hip arthroplasty 07/31/2015   Primary osteoarthritis of right knee 06/17/2014   Thrush of mouth and esophagus (HCC) 04/03/2014   Palpitation 03/28/2014   Hyperlipidemia    History of  colonic polyps 02/09/2013   Paroxysmal atrial fibrillation (HCC) 05/11/2012   Osteoarthritis of left knee 01/10/2012   Seasonal and perennial allergic rhinitis 07/23/2010   Asthma, moderate persistent 03/06/2009    Past Surgical History:  Procedure Laterality Date   42 HOUR PH STUDY N/A 05/30/2021   Procedure: 24 HOUR PH STUDY;  Surgeon: Shellia Cleverly, DO;  Location: [facility];  Service: Gastroenterology;  Laterality: N/A;   ANTERIOR CERVICAL DECOMP/DISCECTOMY FUSION N/A 07/21/2012   Procedure: ANTERIOR CERVICAL DECOMPRESSION/DISCECTOMY FUSION 2 LEVELS;  Surgeon: Barnett Abu, MD;  Location: [facility];  Service: Neurosurgery;  Laterality: N/A;  C4-5 C5-6 Anterior cervical decompression/diskectomy/fusion   ANTERIOR LAT LUMBAR FUSION N/A 04/07/2017   Procedure: Thoracic twelve-Lumbar one Anterolateral decompression/fusion;  Surgeon: Barnett Abu, MD;  Location: [facility] OR;  Service: Neurosurgery;  Laterality: N/A;   ARTHRODESIS FOOT WITH WEIL OSTEOTOMY Left 11/07/2022   Procedure: LEFT THIRD METATARSOPHALANGEAL CAPSULOTOMY, WEIL OSTEOTOMY;  Surgeon: Terance Hart, MD;  Location[facility] OR;  Service: Orthopedics;  Laterality: Left;  LENGTH OF SURGERY: 60 MINUTES   BACK SURGERY     x 3   CARDIAC CATHETERIZATION  11/2008   Dr. Anne Fu - 20% calcified non flow limiting left main, 50% EF apical hypokinesis   CERVICAL DISC ARTHROPLASTY N/A 04/07/2017   Procedure: Cervical six-seven Disc arthroplasty;  Surgeon: Barnett Abu, MD;  Location[facility] OR;  Service: Neurosurgery;  Laterality: N/A;   CHEST TUBE INSERTION Left 04/11/2017   Procedure: CHEST TUBE INSERTION;  Surgeon: Kerin Perna, MD;  Location: [facility] OR;  Service: Thoracic;  Laterality: Left;   CHOLECYSTECTOMY     COLONOSCOPY  2016   CG-MAC-miralax(good)servere TICS/TA x 2   ESOPHAGEAL MANOMETRY N/A 05/30/2021   Procedure: ESOPHAGEAL MANOMETRY (EM);  Surgeon: Shellia Cleverly, DO;  Location: [facility];  Service:  Gastroenterology;  Laterality: N/A;  ph impedence   ESOPHAGOGASTRODUODENOSCOPY     EYE SURGERY Bilateral 2011   steroidal encapsulation" (04/28/2012)   FOOT SURGERY Right    x 2   JOINT REPLACEMENT     Many surgeries including 5 joit replacements   KNEE ARTHROSCOPY Bilateral    LATERAL / POSTERIOR COMBINED FUSION LUMBAR SPINE  2012   LEFT ATRIAL APPENDAGE OCCLUSION N/A 02/22/2021   Procedure: LEFT ATRIAL APPENDAGE OCCLUSION;  Surgeon: Lanier Prude, MD;  [facility];  Service: Cardiovascular;  Laterality: N/A;   POLYPECTOMY  2016   severe TICS/TA x 2   POSTERIOR CERVICAL LAMINECTOMY WITH MET- RX Right 06/11/2021   Procedure: Right Cervical six-seven  Laminectomy and foraminotomy with metrex;  Surgeon: Barnett Abu, MD;  Location: [facility] OR;  Service: Neurosurgery;  Laterality: Right;   REVERSE SHOULDER ARTHROPLASTY  04/28/2012   Procedure: REVERSE SHOULDER ARTHROPLASTY;  Surgeon: Mable Paris, MD;  Location: [facility] OR;  Service: Orthopedics;  Laterality: Left;  Left shouder reverse total shoulder arthroplasty   SHOULDER SURGERY     SPINE SURGERY     I have had 7 spinal surgeries   TEE WITHOUT CARDIOVERSION N/A 02/22/2021   Procedure: TRANSESOPHAGEAL ECHOCARDIOGRAM (TEE);  Surgeon: Lanier Prude, MD;  Location: [facility] INVASIVE CV LAB;  Service: Cardiovascular;  Laterality: N/A;   TENDON REPAIR Right 08/07/2020   Procedure: RIGHT HAND LIGAMENT RECONSTRUCTION AND TENDON INTERPOSITION;  Surgeon: Jodi Geralds, MD;  Location: [facility];  Service: Orthopedics;  Laterality: Right;   TONSILLECTOMY AND ADENOIDECTOMY  1954   TOTAL HIP ARTHROPLASTY Left 2011   "left" (04/28/2012)   TOTAL HIP ARTHROPLASTY Right 07/31/2015   Procedure: TOTAL HIP ARTHROPLASTY ANTERIOR APPROACH;  Surgeon: Jodi Geralds, MD;  Location[facility];  Service: Orthopedics;  Laterality: Right;   TOTAL KNEE ARTHROPLASTY  01/10/2012   Procedure: TOTAL KNEE ARTHROPLASTY;  Surgeon: Harvie Junior, MD;  Location: [facility] OR;  Service: Orthopedics;;  left total knee arthroplasty   TOTAL KNEE ARTHROPLASTY Right 06/17/2014   Procedure: TOTAL KNEE ARTHROPLASTY;  Surgeon: Harvie Junior, MD;  Location: [facility] OR;  Service: Orthopedics;  Laterality: Right;   toupet fundolplication  11/01/2021   TRANSESOPHAGEAL ECHOCARDIOGRAM (CATH LAB) N/A 03/24/2023   Procedure: TRANSESOPHAGEAL ECHOCARDIOGRAM;  Surgeon: Chrystie Nose, MD;  Location: [facility];  Service: Cardiovascular;  Laterality: N/A;       Home Medications    Prior to Admission medications   Medication Sig Start Date End Date Taking? Authorizing Provider  albuterol (VENTOLIN HFA) 108 (90 Base) MCG/ACT inhaler INHALE 2 PUFFS BY MOUTH EVERY 4 HOURS AS NEEDED FOR WHEEZE OR FOR SHORTNESS OF BREATH 01/14/23  Yes Young, Joni Fears D, MD  allopurinol (ZYLOPRIM) 100 MG tablet Take 1 tablet (100 mg total) by mouth in the morning. 11/25/22  Yes Monica Becton, MD  atorvastatin (LIPITOR) 20 MG tablet TAKE 1 TABLET BY MOUTH EVERY DAY 09/17/22  Yes Jake Bathe, MD  cyclobenzaprine (FLEXERIL) 10 MG tablet Take 1 tablet (10 mg total) by mouth at bedtime. 03/12/23  Yes Monica Becton, MD  diclofenac Sodium (VOLTAREN) 1 % GEL Apply 2 g topically 3 (three) times daily as needed (pain.).   Yes [provider]  diltiazem (CARDIZEM CD) 180 MG 24 hr capsule TAKE 1 CAPSULE BY MOUTH EVERY DAY 10/16/22  Yes Jake Bathe, MD  diltiazem (CARDIZEM) 30 MG tablet TAKE 1 TABLET (30 MG TOTAL) BY MOUTH AS NEEDED (HEART RATE OVER 120). 03/10/23  Yes Jake Bathe, MD  doxycycline (VIBRAMYCIN) 100 MG capsule Take 1 capsule (100 mg total) by mouth 2 (two) times daily for 10 days. 04/11/23 04/21/23 Yes Trevor Iha, FNP  DULoxetine (CYMBALTA) 30 MG capsule Take 1 capsule (30 mg total) by mouth daily. 12/24/22  Yes Lomax, Amy, NP  ELIQUIS 5 MG TABS tablet TAKE 1 TABLET BY MOUTH TWICE A DAY 01/28/23  Yes Skains, Veverly Fells, MD   ipratropium-albuterol (DUONEB) 0.5-2.5 (3) MG/3ML SOLN 1 NEBULE EVERY 6 HOURS AS NEEDED. **J45.3** Patient taking differently: Take 3 mLs by nebulization every 6 (six) hours as needed (for shortness of breath and wheezing). 03/22/20  Yes Young, Joni Fears D, MD  isosorbide mononitrate (IMDUR) 30 MG 24 hr tablet TAKE 1 TABLET BY MOUTH EVERY DAY 03/10/23  Yes Jake Bathe, MD  leptospermum manuka honey (MEDIHONEY) PSTE paste Apply 1 Application topically daily. 03/25/23  Yes Jonah Blue, MD  Nebulizers (COMPRESSOR/NEBULIZER) MISC Use as directed 12/01/17  Yes Young, Clinton D, MD  Olodaterol HCl (STRIVERDI RESPIMAT) 2.5 MCG/ACT AERS INHALE 2 PUFFS BY MOUTH INTO THE LUNGS DAILY 03/10/23  Yes Young, Joni Fears D, MD  ondansetron (ZOFRAN-ODT) 8 MG disintegrating tablet Take 1 tablet (8 mg total) by mouth every 8 (eight) hours as needed for nausea. 04/07/23  Yes Monica Becton, MD  oxyCODONE-acetaminophen (PERCOCET) 10-325 MG tablet Take 1 tablet by mouth every 8 (eight) hours as needed for pain. 04/07/23  Yes Monica Becton, MD  pantoprazole (PROTONIX) 40 MG tablet TAKE 1 TABLET (40 MG TOTAL) BY MOUTH TWICE A DAY BEFORE MEALS 11/04/22  Yes Monica Becton, MD  predniSONE (DELTASONE) 10 MG tablet Take 1 tablet (10 mg total) by mouth in the morning and at bedtime. 10/30/22  Yes Young, Joni Fears D, MD  pregabalin (LYRICA) 75 MG capsule Take 1 capsule (75 mg total) by mouth 3 (three) times daily. Patient taking differently: Take 75 mg by mouth 2 (two) times daily. 05/16/22  Yes Monica Becton, MD  tamsulosin (FLOMAX) 0.4 MG CAPS capsule Take 0.4 mg by mouth at bedtime.   Yes [provider]    Family History Family History  Problem Relation Age of Onset   Diabetes Mother    Heart disease Father        Died 64, MI   Cancer Father    Hyperlipidemia Father    Stroke Father    Prostate cancer Brother    Colon polyps Brother    Colon polyps Brother    Asthma Brother     COPD Brother    Colon cancer Brother        Dx age 34   Cancer Brother    Colon cancer Other    Esophageal cancer Neg Hx    Rectal cancer Neg Hx    Stomach cancer Neg Hx    Pancreatic cancer Neg Hx    Kidney disease Neg Hx    Liver disease Neg Hx    Neuropathy Neg Hx     Social History Social History   Tobacco Use   Smoking status: Never   Smokeless tobacco: Never  Vaping Use   Vaping status: Never Used  Substance Use Topics   Alcohol use: No   Drug use: No     Allergies   Levaquin [levofloxacin]   Review of Systems Review of Systems  Skin:  Positive for wound.     Physical Exam Triage Vital Signs ED Triage Vitals  Encounter Vitals Group     BP 04/17/23 0829 111/72     Systolic BP Percentile --      Diastolic BP Percentile --      Pulse Rate 04/17/23 0829 (!) 111     Resp 04/17/23 0829 16     Temp 04/17/23 0829 98.8 F (37.1 C)     Temp Source 04/17/23 0829 Oral     SpO2 04/17/23 0829 97 %     Weight --      Height --      Head Circumference --      Peak Flow --      Pain Score 04/17/23 0831 8     Pain Loc --      Pain Education --      Exclude from Growth Chart --    No data found.  Updated Vital Signs BP 111/72 (BP Location: Left Arm)   Pulse (!) 111   Temp 98.8 F (  37.1 C) (Oral)   Resp 16   SpO2 97%    Physical Exam Vitals and nursing note reviewed.  Constitutional:      Appearance: Normal appearance. He is normal weight. He is ill-appearing.  HENT:     Head: Normocephalic and atraumatic.     Mouth/Throat:     Mouth: Mucous membranes are moist.     Pharynx: Oropharynx is clear.  Eyes:     Extraocular Movements: Extraocular movements intact.     Conjunctiva/sclera: Conjunctivae normal.     Pupils: Pupils are equal, round, and reactive to light.  Cardiovascular:     Rate and Rhythm: Normal rate and regular rhythm.     Pulses: Normal pulses.     Heart sounds: Normal heart sounds.  Pulmonary:     Effort: Pulmonary effort is  normal.     Breath sounds: Normal breath sounds. No wheezing, rhonchi or rales.  Chest:     Chest wall: No tenderness.  Musculoskeletal:        General: Normal range of motion.     Cervical back: Normal range of motion and neck supple.  Skin:    General: Skin is warm and dry.     Comments: Left/Right lower leg (dorsum): Erythematous, indurated, fluctuant please see images below  Neurological:     General: No focal deficit present.     Mental Status: He is alert and oriented to person, place, and time. Mental status is at baseline.  Psychiatric:        Mood and Affect: Mood normal.        Behavior: Behavior normal.         UC Treatments / Results  Labs (all labs ordered are listed, but only abnormal results are displayed) Labs Reviewed  COMPREHENSIVE METABOLIC PANEL  CBC WITH DIFFERENTIAL/PLATELET    EKG   Radiology No results found.  Procedures Procedures (including critical care time)  Medications Ordered in UC Medications - No data to display  Initial Impression / Assessment and Plan / UC Course  I have reviewed the triage vital signs and the nursing notes.  Pertinent labs & imaging results that were available during my care of the patient were reviewed by me and considered in my medical decision making (see chart for details).     MDM: 1.  Laceration of right lower leg, subsequent encounter-CMP, CBC with differential ordered; 2.  Laceration of left lower leg, subsequent encounter-CMP, CBC with differential ordered,: Called and advised patient to go to [facility], ED at 11:20 AM this morning stat labs were ordered at 915 this morning and labs were never picked up by lab courier. Patient advised to go to ED to rule out sepsis.  Patient agreed and verbalized understanding of these instructions and this plan of care today.  Patient reports wife will be driving by POV now.  Safety zone issued today by RN on staff.  Final Clinical Impressions(s) / UC Diagnoses    Final diagnoses:  Laceration of right lower leg, subsequent encounter  Laceration of left lower extremity, subsequent encounter     Discharge Instructions      Advised patient we will follow-up with lab results once received.  Advised patient/wife if symptoms worsen please go to [facility], ED for further evaluation and possible IV antibiotics.     ED Prescriptions   None    I have reviewed the PDMP during this encounter.   Trevor Iha, FNP 04/17/23 1123

## 2023-04-17 NOTE — ED Provider Notes (Addendum)
[place] EMERGENCY DEPARTMENT AT [facility] Provider Note   CSN: [id number] Arrival date & time: 04/17/23  1157     History  Chief Complaint  Patient presents with   Leg Pain    Levi [name] is a 77 y.o. male.  Patient is a 77 yo male presenting for leg pain. Pt admits to redness, swelling, and pain in bilateral lower extremities. Hx complicated by recent admission 10/23-10/28 for left lower extremity wound with cellulitis and sepsis. Pt currently taking doxycycline. Obtained new lower extremity wounds on each shin after running into stove door that was pulled down 7-10 days ago. Admits to oral temp of 100.5 F at home prior to arrival. Took tylenol.   The history is provided by the patient and the spouse. No language interpreter was used.  Leg Pain Associated symptoms: fever   Associated symptoms: no back pain        Home Medications Prior to Admission medications   Medication Sig Start Date End Date Taking? Authorizing Provider  albuterol (VENTOLIN HFA) 108 (90 Base) MCG/ACT inhaler INHALE 2 PUFFS BY MOUTH EVERY 4 HOURS AS NEEDED FOR WHEEZE OR FOR SHORTNESS OF BREATH 01/14/23   Jetty Duhamel D, MD  allopurinol (ZYLOPRIM) 100 MG tablet Take 1 tablet (100 mg total) by mouth in the morning. 11/25/22   Monica Becton, MD  atorvastatin (LIPITOR) 20 MG tablet TAKE 1 TABLET BY MOUTH EVERY DAY 09/17/22   Jake Bathe, MD  cyclobenzaprine (FLEXERIL) 10 MG tablet Take 1 tablet (10 mg total) by mouth at bedtime. 03/12/23   Monica Becton, MD  diclofenac Sodium (VOLTAREN) 1 % GEL Apply 2 g topically 3 (three) times daily as needed (pain.).    [provider]  diltiazem (CARDIZEM CD) 180 MG 24 hr capsule TAKE 1 CAPSULE BY MOUTH EVERY DAY 10/16/22   Jake Bathe, MD  diltiazem (CARDIZEM) 30 MG tablet TAKE 1 TABLET (30 MG TOTAL) BY MOUTH AS NEEDED (HEART RATE OVER 120). 03/10/23   Jake Bathe, MD  doxycycline (VIBRAMYCIN) 100 MG capsule Take 1 capsule  (100 mg total) by mouth 2 (two) times daily for 10 days. 04/11/23 04/21/23  Trevor Iha, FNP  DULoxetine (CYMBALTA) 30 MG capsule Take 1 capsule (30 mg total) by mouth daily. 12/24/22   Lomax, Amy, NP  ELIQUIS 5 MG TABS tablet TAKE 1 TABLET BY MOUTH TWICE A DAY 01/28/23   Jake Bathe, MD  ipratropium-albuterol (DUONEB) 0.5-2.5 (3) MG/3ML SOLN 1 NEBULE EVERY 6 HOURS AS NEEDED. **J45.3** Patient taking differently: Take 3 mLs by nebulization every 6 (six) hours as needed (for shortness of breath and wheezing). 03/22/20   Jetty Duhamel D, MD  isosorbide mononitrate (IMDUR) 30 MG 24 hr tablet TAKE 1 TABLET BY MOUTH EVERY DAY 03/10/23   Jake Bathe, MD  leptospermum manuka honey (MEDIHONEY) PSTE paste Apply 1 Application topically daily. 03/25/23   Jonah Blue, MD  Nebulizers (COMPRESSOR/NEBULIZER) MISC Use as directed 12/01/17   Waymon Budge, MD  Olodaterol HCl (STRIVERDI RESPIMAT) 2.5 MCG/ACT AERS INHALE 2 PUFFS BY MOUTH INTO THE LUNGS DAILY 03/10/23   Jetty Duhamel D, MD  ondansetron (ZOFRAN-ODT) 8 MG disintegrating tablet Take 1 tablet (8 mg total) by mouth every 8 (eight) hours as needed for nausea. 04/07/23   Monica Becton, MD  oxyCODONE-acetaminophen (PERCOCET) 10-325 MG tablet Take 1 tablet by mouth every 8 (eight) hours as needed for pain. 04/07/23   Monica Becton, MD  pantoprazole (  PROTONIX) 40 MG tablet TAKE 1 TABLET (40 MG TOTAL) BY MOUTH TWICE A DAY BEFORE MEALS 11/04/22   Monica Becton, MD  predniSONE (DELTASONE) 10 MG tablet Take 1 tablet (10 mg total) by mouth in the morning and at bedtime. 10/30/22   Waymon Budge, MD  pregabalin (LYRICA) 75 MG capsule Take 1 capsule (75 mg total) by mouth 3 (three) times daily. Patient taking differently: Take 75 mg by mouth 2 (two) times daily. 05/16/22   Monica Becton, MD  tamsulosin (FLOMAX) 0.4 MG CAPS capsule Take 0.4 mg by mouth at bedtime.    [provider]      Allergies    Levaquin  [levofloxacin]    Review of Systems   Review of Systems  Constitutional:  Positive for fever. Negative for chills.  HENT:  Negative for ear pain and sore throat.   Eyes:  Negative for pain and visual disturbance.  Respiratory:  Negative for cough and shortness of breath.   Cardiovascular:  Positive for leg swelling. Negative for chest pain and palpitations.  Gastrointestinal:  Positive for nausea. Negative for abdominal pain and vomiting.  Genitourinary:  Negative for dysuria and hematuria.  Musculoskeletal:  Negative for arthralgias and back pain.  Skin:  Positive for wound. Negative for color change and rash.  Neurological:  Negative for seizures and syncope.  All other systems reviewed and are negative.   Physical Exam Updated Vital Signs BP 130/84 (BP Location: Left Arm)   Pulse (!) 101   Temp 98 F (36.7 C)   Resp 18   Ht 6\' 2"  (1.88 m)   Wt 87.5 kg   SpO2 98%   BMI 24.78 kg/m  Physical Exam Vitals and nursing note reviewed.  Constitutional:      General: He is not in acute distress.    Appearance: He is well-developed.  HENT:     Head: Normocephalic and atraumatic.  Eyes:     Conjunctiva/sclera: Conjunctivae normal.  Cardiovascular:     Rate and Rhythm: Normal rate and regular rhythm.     Heart sounds: No murmur heard. Pulmonary:     Effort: Pulmonary effort is normal. No respiratory distress.     Breath sounds: Normal breath sounds.  Abdominal:     Palpations: Abdomen is soft.     Tenderness: There is no abdominal tenderness.  Musculoskeletal:        General: No swelling.     Cervical back: Neck supple.  Skin:    General: Skin is warm and dry.     Capillary Refill: Capillary refill takes less than 2 seconds.          Comments: Bilateral lower extremity swellings are erythematous, warm, and tender.  Please see attached photos.  Neurological:     Mental Status: He is alert.  Psychiatric:        Mood and Affect: Mood normal.            ED  Results / Procedures / Treatments   Labs (all labs ordered are listed, but only abnormal results are displayed) Labs Reviewed  COMPREHENSIVE METABOLIC PANEL - Abnormal; Notable for the following components:      Result Value   Glucose, Bld 127 (*)    Total Protein 5.8 (*)    Albumin 3.1 (*)    All other components within normal limits  CBC WITH DIFFERENTIAL/PLATELET - Abnormal; Notable for the following components:   WBC 19.8 (*)    RBC 3.87 (*)  Hemoglobin 11.7 (*)    HCT 37.4 (*)    RDW 17.6 (*)    Neutro Abs 16.1 (*)    Monocytes Absolute 1.3 (*)    Abs Immature Granulocytes 0.45 (*)    All other components within normal limits  URINALYSIS, W/ REFLEX TO CULTURE (INFECTION SUSPECTED) - Abnormal; Notable for the following components:   Hgb urine dipstick SMALL (*)    Leukocytes,Ua TRACE (*)    All other components within normal limits  CULTURE, BLOOD (ROUTINE X 2)  CULTURE, BLOOD (ROUTINE X 2)  AEROBIC CULTURE W GRAM STAIN (SUPERFICIAL SPECIMEN)  AEROBIC CULTURE W GRAM STAIN (SUPERFICIAL SPECIMEN)  I-STAT CG4 LACTIC ACID, ED  I-STAT CG4 LACTIC ACID, ED    EKG None  Radiology No results found.  Procedures .Critical Care  Performed by: Franne Forts, DO Authorized by: Franne Forts, DO   Critical care provider statement:    Critical care time (minutes):  102   Critical care was necessary to treat or prevent imminent or life-threatening deterioration of the following conditions:  Sepsis   Critical care was time spent personally by me on the following activities:  Development of treatment plan with patient or surrogate, discussions with consultants, evaluation of patient's response to treatment, examination of patient, ordering and review of laboratory studies, ordering and review of radiographic studies, ordering and performing treatments and interventions, pulse oximetry, re-evaluation of patient's condition and review of old charts   Care discussed with: admitting  provider       Medications Ordered in ED Medications  sodium chloride 0.9 % bolus 1,000 mL (1,000 mLs Intravenous New Bag/Given 04/17/23 1414)  acetaminophen (TYLENOL) tablet 325 mg (325 mg Oral Given 04/17/23 1414)  cefTRIAXone (ROCEPHIN) 1 g in sodium chloride 0.9 % 100 mL IVPB (1 g Intravenous New Bag/Given 04/17/23 1414)    ED Course/ Medical Decision Making/ A&P                                 Medical Decision Making Amount and/or Complexity of Data Reviewed Labs: ordered.  Risk OTC drugs. Decision regarding hospitalization.   77 yo male presenting for leg pain. Pt admits to redness, swelling, and pain in bilateral lower extremities. Hx complicated by recent admission 10/23-10/28 for left lower extremity wound with cellulitis and sepsis.Patient is alert and O x 3, no acute distress, tachycardic, with otherwise stable vital signs.  Reported fever at the house of 100.5 F but took tylenol prior to arrival. Current temp 98 F. On exam patient has bilateral lower extremity swelling, erythema, tenderness, and warmth concerning for cellulitis.  He has 2 new anterior lower extremity wounds and 1 old right lateral wound.  Wound cultures ordered.  Rocephin given.  Leukocytosis of 19 present.  Lactic acid stable. Patient offered and declined medication for pain control at this time.  Recommending admission for failure of outpatient antibiotics for cellulitis or possibly new cellulitis from new wounds with sepsis. No septic shock.  Patient agreeable to plan.  I spoke with the our hospitalist team who agrees to accept the patient.   Final Clinical Impression(s) / ED Diagnoses Final diagnoses:  Cellulitis of left lower extremity  Cellulitis of right lower extremity  Sepsis, due to unspecified organism, unspecified whether acute organ dysfunction present [facility])    Rx / DC Orders ED Discharge Orders     None         Wallace Cullens,  Hessie Knows, DO 04/17/23 1604    Franne Forts, DO 04/17/23  1606

## 2023-04-17 NOTE — ED Triage Notes (Signed)
Pt to ED via pov. Pt has had ongoing right leg wound. Pt was recently hospitalized for same w/sepsis and was sent home on IV anbx. Pt finished those and has noticed that he now has more wounds and wounds on left leg. Pt also has swelling and redness in bilateral lower legs. Pt seen at UC x 2 for same. Pt on doxycycline for approximately a week. Pt and family state swelling and redness have worsened. UC told pt to come to ED today.

## 2023-04-17 NOTE — ED Notes (Signed)
Phoned [facility] to inform them this RN will be transporting pt upstairs on remote monitor [id number].

## 2023-04-17 NOTE — ED Triage Notes (Signed)
Patient c/o severe bilateral leg pain, redness, swelling, hurts to touch worse this morning when he got up.  Patient does have a follow up with infectious disease on Monday.

## 2023-04-17 NOTE — ED Notes (Signed)
Stat lab called for pick up [phone]

## 2023-04-17 NOTE — ED Notes (Addendum)
ED TO INPATIENT HANDOFF REPORT  ED Nurse Name and Phone #: Marcelino Duster [phone]  S Name/Age/Gender Levi [name] 77 y.o. male Room/Bed: 043C/043C  Code Status   Code Status: Full Code  Home/SNF/Other Home Patient oriented to: self, place, time, and situation Is this baseline? Yes   Triage Complete: Triage complete  Chief Complaint Cellulitis [L03.90]  Triage Note Pt to ED via pov. Pt has had ongoing right leg wound. Pt was recently hospitalized for same w/sepsis and was sent home on IV anbx. Pt finished those and has noticed that he now has more wounds and wounds on left leg. Pt also has swelling and redness in bilateral lower legs. Pt seen at UC x 2 for same. Pt on doxycycline for approximately a week. Pt and family state swelling and redness have worsened. UC told pt to come to ED today.    Allergies Allergies  Allergen Reactions   Levaquin [Levofloxacin] Itching    Level of Care/Admitting Diagnosis ED Disposition     ED Disposition  Admit   Condition  --   Comment  Hospital Area: [facility] [[id number]]  Level of Care: Telemetry Medical [104]  May place patient in observation at [facility] or [facility] if equivalent level of care is available:: No  Covid Evaluation: Asymptomatic - no recent exposure (last 10 days) testing not required  Diagnosis: Cellulitis [098119]  Admitting Physician: Synetta Fail [[id number]]  Attending Physician: Synetta Fail [phone]          B Medical/Surgery History Past Medical History:  Diagnosis Date   Agent orange exposure 1970's   takes Imdur,Diltiazem, and Atacand daily   ALLERGIC RHINITIS    Allergy    seasonal allergies   Asthma    uses inhaler and nebulizers   Bruises easily    d/t prednisone daily   Cataract    Cellulitis 03/22/2020   Chest wall pain 09/21/2019   L scapula, reported 4/27 21/     CHF (congestive heart failure) (HCC)    dx-on meds to treat   Chronic bronchitis  (HCC)    "used to get it q yr; last time ?2008" (04/28/2012)   Clotting disorder (HCC) 2019   On blood thinner when I bleed it is for a consideral amount of time. This is tegardless of size of wound.   COPD (chronic obstructive pulmonary disease) (HCC)    agent orange exposure   Degenerative disk disease    "everywhere" (04/28/2012)   Diverticulitis    Dysrhythmia    afib   Elevated uric acid in blood    takes Allopurinol daily   Emphysema of lung (HCC)    Enlarged prostate    but not on any meds   GERD (gastroesophageal reflux disease)    takes Protonix daily   H/O hiatal hernia    Headache    History of pneumonia    a. 2010   Hyperlipidemia    takes Lipitor daily; pt. states he takes a preventive   Hypothyroidism    Joint pain    Joint swelling    MSSA bacteremia 03/19/2023   Nausea and vomiting 11/12/2022   Neuromuscular disorder (HCC)    bilat legs, and bilat arms   Osteoporosis 2012   PAF (paroxysmal atrial fibrillation) (HCC)    a. Dx 04/2012, CHA2DS2VASc = 1 (age);  b. 04/2012 Echo: EF 40-50%, mild MR.   Personal history of colonic adenomas 02/09/2013   Pneumonia    Sepsis (HCC) 03/18/2023  Shortness of breath dyspnea    Thyroid disease    no taking meds-caused dizziness   Past Surgical History:  Procedure Laterality Date   35 HOUR PH STUDY N/A 05/30/2021   Procedure: 24 HOUR PH STUDY;  Surgeon: Shellia Cleverly, DO;  Location: [facility];  Service: Gastroenterology;  Laterality: N/A;   ANTERIOR CERVICAL DECOMP/DISCECTOMY FUSION N/A 07/21/2012   Procedure: ANTERIOR CERVICAL DECOMPRESSION/DISCECTOMY FUSION 2 LEVELS;  Surgeon: Barnett Abu, MD;  Location: [facility];  Service: Neurosurgery;  Laterality: N/A;  C4-5 C5-6 Anterior cervical decompression/diskectomy/fusion   ANTERIOR LAT LUMBAR FUSION N/A 04/07/2017   Procedure: Thoracic twelve-Lumbar one Anterolateral decompression/fusion;  Surgeon: Barnett Abu, MD;  Location: [facility] OR;  Service: Neurosurgery;   Laterality: N/A;   ARTHRODESIS FOOT WITH WEIL OSTEOTOMY Left 11/07/2022   Procedure: LEFT THIRD METATARSOPHALANGEAL CAPSULOTOMY, WEIL OSTEOTOMY;  Surgeon: Terance Hart, MD;  Location: [facility] OR;  Service: Orthopedics;  Laterality: Left;  LENGTH OF SURGERY: 60 MINUTES   BACK SURGERY     x 3   CARDIAC CATHETERIZATION  11/2008   Dr. Anne Fu - 20% calcified non flow limiting left main, 50% EF apical hypokinesis   CERVICAL DISC ARTHROPLASTY N/A 04/07/2017   Procedure: Cervical six-seven Disc arthroplasty;  Surgeon: Barnett Abu, MD;  Location: [facility] OR;  Service: Neurosurgery;  Laterality: N/A;   CHEST TUBE INSERTION Left 04/11/2017   Procedure: CHEST TUBE INSERTION;  Surgeon: Kerin Perna, MD;  Location: [facility] OR;  Service: Thoracic;  Laterality: Left;   CHOLECYSTECTOMY     COLONOSCOPY  2016   CG-MAC-miralax(good)servere TICS/TA x 2   ESOPHAGEAL MANOMETRY N/A 05/30/2021   Procedure: ESOPHAGEAL MANOMETRY (EM);  Surgeon: Shellia Cleverly, DO;  Location: [facility];  Service: Gastroenterology;  Laterality: N/A;  ph impedence   ESOPHAGOGASTRODUODENOSCOPY     EYE SURGERY Bilateral 2011   steroidal encapsulation" (04/28/2012)   FOOT SURGERY Right    x 2   JOINT REPLACEMENT     Many surgeries including 5 joit replacements   KNEE ARTHROSCOPY Bilateral    LATERAL / POSTERIOR COMBINED FUSION LUMBAR SPINE  2012   LEFT ATRIAL APPENDAGE OCCLUSION N/A 02/22/2021   Procedure: LEFT ATRIAL APPENDAGE OCCLUSION;  Surgeon: Lanier Prude, MD;  [facility];  Service: Cardiovascular;  Laterality: N/A;   POLYPECTOMY  2016   severe TICS/TA x 2   POSTERIOR CERVICAL LAMINECTOMY WITH MET- RX Right 06/11/2021   Procedure: Right Cervical six-seven  Laminectomy and foraminotomy with metrex;  Surgeon: Barnett Abu, MD;  Location: [facility] OR;  Service: Neurosurgery;  Laterality: Right;   REVERSE SHOULDER ARTHROPLASTY  04/28/2012   Procedure: REVERSE SHOULDER ARTHROPLASTY;  Surgeon: Mable Paris, MD;  Location: [facility] OR;  Service: Orthopedics;  Laterality: Left;  Left shouder reverse total shoulder arthroplasty   SHOULDER SURGERY     SPINE SURGERY     I have had 7 spinal surgeries   TEE WITHOUT CARDIOVERSION N/A 02/22/2021   Procedure: TRANSESOPHAGEAL ECHOCARDIOGRAM (TEE);  Surgeon: Lanier Prude, MD;  Location: [facility] INVASIVE CV LAB;  Service: Cardiovascular;  Laterality: N/A;   TENDON REPAIR Right 08/07/2020   Procedure: RIGHT HAND LIGAMENT RECONSTRUCTION AND TENDON INTERPOSITION;  Surgeon: Jodi Geralds, MD;  Location: [facility];  Service: Orthopedics;  Laterality: Right;   TONSILLECTOMY AND ADENOIDECTOMY  1954   TOTAL HIP ARTHROPLASTY Left 2011   "left" (04/28/2012)   TOTAL HIP ARTHROPLASTY Right 07/31/2015   Procedure: TOTAL HIP ARTHROPLASTY ANTERIOR APPROACH;  Surgeon: Jodi Geralds,  MD;  [facility];  Service: Orthopedics;  Laterality: Right;   TOTAL KNEE ARTHROPLASTY  01/10/2012   Procedure: TOTAL KNEE ARTHROPLASTY;  Surgeon: Harvie Junior, MD;  Location: [facility] OR;  Service: Orthopedics;;  left total knee arthroplasty   TOTAL KNEE ARTHROPLASTY Right 06/17/2014   Procedure: TOTAL KNEE ARTHROPLASTY;  Surgeon: Harvie Junior, MD;  Location: [facility] OR;  Service: Orthopedics;  Laterality: Right;   toupet fundolplication  11/01/2021   TRANSESOPHAGEAL ECHOCARDIOGRAM (CATH LAB) N/A 03/24/2023   Procedure: TRANSESOPHAGEAL ECHOCARDIOGRAM;  Surgeon: Chrystie Nose, MD;  [facility];  Service: Cardiovascular;  Laterality: N/A;     A IV Location/Drains/Wounds Patient Lines/Drains/Airways Status     Active Line/Drains/Airways     Name Placement date Placement time Site Days   Peripheral IV 04/17/23 20 G Anterior;Distal;Right;Upper Arm 04/17/23  1404  Arm  less than 1   Wound / Incision (Open or Dehisced) 03/20/23 Other (Comment) Pretibial Right;Lateral 03/20/23  0055  Pretibial  28            Intake/Output Last 24 hours  Intake/Output  Summary (Last 24 hours) at 04/17/2023 1823 Last data filed at 04/17/2023 1521 Gross per 24 hour  Intake 1100 ml  Output --  Net 1100 ml    Labs/Imaging Results for orders placed or performed during the hospital encounter of 04/17/23 (from the past 48 hour(s))  Comprehensive metabolic panel     Status: Abnormal   Collection Time: 04/17/23 12:28 PM  Result Value Ref Range   Sodium 135 135 - 145 mmol/L   Potassium 3.5 3.5 - 5.1 mmol/L   Chloride 102 98 - 111 mmol/L   CO2 24 22 - 32 mmol/L   Glucose, Bld 127 (H) 70 - 99 mg/dL    Comment: Glucose reference range applies only to samples taken after fasting for at least 8 hours.   BUN 15 8 - 23 mg/dL   Creatinine, Ser 7.82 0.61 - 1.24 mg/dL   Calcium 9.0 8.9 - 95.6 mg/dL   Total Protein 5.8 (L) 6.5 - 8.1 g/dL   Albumin 3.1 (L) 3.5 - 5.0 g/dL   AST 21 15 - 41 U/L   ALT 17 0 - 44 U/L   Alkaline Phosphatase 83 38 - 126 U/L   Total Bilirubin 1.1 <1.2 mg/dL   GFR, Estimated >21 >30 mL/min    Comment: (NOTE) Calculated using the CKD-EPI Creatinine Equation (2021)    Anion gap 9 5 - 15    Comment: Performed at [facility], [address], [place], [place] [address]  CBC with Differential     Status: Abnormal   Collection Time: 04/17/23 12:28 PM  Result Value Ref Range   WBC 19.8 (H) 4.0 - 10.5 K/uL   RBC 3.87 (L) 4.22 - 5.81 MIL/uL   Hemoglobin 11.7 (L) 13.0 - 17.0 g/dL   HCT 46.9 (L) 62.9 - 52.8 %   MCV 96.6 80.0 - 100.0 fL   MCH 30.2 26.0 - 34.0 pg   MCHC 31.3 30.0 - 36.0 g/dL   RDW 41.3 (H) 24.4 - 01.0 %   Platelets 179 150 - 400 K/uL   nRBC 0.0 0.0 - 0.2 %   Neutrophils Relative % 81 %   Neutro Abs 16.1 (H) 1.7 - 7.7 K/uL   Lymphocytes Relative 10 %   Lymphs Abs 1.9 0.7 - 4.0 K/uL   Monocytes Relative 7 %   Monocytes Absolute 1.3 (H) 0.1 - 1.0 K/uL  Eosinophils Relative 0 %   Eosinophils Absolute 0.0 0.0 - 0.5 K/uL   Basophils Relative 0 %   Basophils Absolute 0.0 0.0 - 0.1 K/uL   Immature Granulocytes 2 %    Abs Immature Granulocytes 0.45 (H) 0.00 - 0.07 K/uL    Comment: Performed at [facility], [address] [place], [place] [address]  Urinalysis, w/ Reflex to Culture (Infection Suspected) -Urine, Clean Catch     Status: Abnormal   Collection Time: 04/17/23 12:28 PM  Result Value Ref Range   Specimen Source URINE, CLEAN CATCH    Color, Urine YELLOW YELLOW   APPearance CLEAR CLEAR   Specific Gravity, Urine 1.013 1.005 - 1.030   pH 6.0 5.0 - 8.0   Glucose, UA NEGATIVE NEGATIVE mg/dL   Hgb urine dipstick SMALL (A) NEGATIVE   Bilirubin Urine NEGATIVE NEGATIVE   Ketones, ur NEGATIVE NEGATIVE mg/dL   Protein, ur NEGATIVE NEGATIVE mg/dL   Nitrite NEGATIVE NEGATIVE   Leukocytes,Ua TRACE (A) NEGATIVE   RBC / HPF 0-5 0 - 5 RBC/hpf   WBC, UA 0-5 0 - 5 WBC/hpf    Comment:        Reflex urine culture not performed if WBC <=10, OR if Squamous epithelial cells >5. If Squamous epithelial cells >5 suggest recollection.    Bacteria, UA NONE SEEN NONE SEEN   Squamous Epithelial / HPF 0-5 0 - 5 /HPF   Mucus PRESENT     Comment: Performed at [facility], [address] [place], [place] [address]  I-Stat Lactic Acid, ED     Status: None   Collection Time: 04/17/23 12:39 PM  Result Value Ref Range   Lactic Acid, Venous 1.5 0.5 - 1.9 mmol/L  I-Stat Lactic Acid, ED     Status: None   Collection Time: 04/17/23  3:15 PM  Result Value Ref Range   Lactic Acid, Venous 1.0 0.5 - 1.9 mmol/L   *Note: Due to a large number of results and/or encounters for the requested time period, some results have not been displayed. A complete set of results can be found in Results Review.   No results found.  Pending Labs Unresulted Labs (From admission, onward)     Start     Ordered   04/18/23 0500  Comprehensive metabolic panel  Tomorrow morning,   R        04/17/23 1606   04/18/23 0500  CBC  Tomorrow morning,   R        04/17/23 1606   04/17/23 1441  Aerobic Culture w Gram Stain (superficial  specimen)  Once,   URGENT        04/17/23 1440   04/17/23 1440  Aerobic Culture w Gram Stain (superficial specimen)  Once,   URGENT        04/17/23 1440   04/17/23 1330  Blood culture (routine x 2)  BLOOD CULTURE X 2,   R (with STAT occurrences)      04/17/23 1329            Vitals/Pain Today's Vitals   04/17/23 1226 04/17/23 1405 04/17/23 1659 04/17/23 1738  BP:  130/84    Pulse:  (!) 101    Resp:  18    Temp:   98.9 F (37.2 C)   TempSrc:   Oral   SpO2:  98%    Weight: 87.5 kg     Height: 6\' 2"  (1.88 m)     PainSc: 6  7     Isolation Precautions No active isolations  Medications Medications  allopurinol (ZYLOPRIM) tablet 100 mg (has no administration in time range)  isosorbide mononitrate (IMDUR) 24 hr tablet 30 mg (has no administration in time range)  atorvastatin (LIPITOR) tablet 20 mg (has no administration in time range)  diltiazem (CARDIZEM CD) 24 hr capsule 180 mg (has no administration in time range)  DULoxetine (CYMBALTA) DR capsule 30 mg (has no administration in time range)  pantoprazole (PROTONIX) EC tablet 40 mg (40 mg Oral Given 04/17/23 1738)  apixaban (ELIQUIS) tablet 5 mg (has no administration in time range)  tamsulosin (FLOMAX) capsule 0.4 mg (has no administration in time range)  pregabalin (LYRICA) capsule 75 mg (has no administration in time range)  arformoterol (BROVANA) nebulizer solution 15 mcg (has no administration in time range)  sodium chloride flush (NS) 0.9 % injection 3 mL (has no administration in time range)  acetaminophen (TYLENOL) tablet 650 mg (has no administration in time range)    Or  acetaminophen (TYLENOL) suppository 650 mg (has no administration in time range)  polyethylene glycol (MIRALAX / GLYCOLAX) packet 17 g (has no administration in time range)  cefTRIAXone (ROCEPHIN) 2 g in sodium chloride 0.9 % 100 mL IVPB (has no administration in time range)  albuterol (PROVENTIL) (2.5 MG/3ML) 0.083% nebulizer solution 2.5 mg  (has no administration in time range)  oxyCODONE (Oxy IR/ROXICODONE) immediate release tablet 5 mg (5 mg Oral Given 04/17/23 1738)  sodium chloride 0.9 % bolus 1,000 mL (0 mLs Intravenous Stopped 04/17/23 1521)  acetaminophen (TYLENOL) tablet 325 mg (325 mg Oral Given 04/17/23 1414)  cefTRIAXone (ROCEPHIN) 1 g in sodium chloride 0.9 % 100 mL IVPB (0 g Intravenous Stopped 04/17/23 1444)    Mobility walks with device     Focused Assessments Redness and pain to bilateral legs, patient has history of neuropathy   R Recommendations: See Admitting Provider Note  Report given to:   Additional Notes:

## 2023-04-18 DIAGNOSIS — Z881 Allergy status to other antibiotic agents status: Secondary | ICD-10-CM | POA: Diagnosis not present

## 2023-04-18 DIAGNOSIS — Z96653 Presence of artificial knee joint, bilateral: Secondary | ICD-10-CM | POA: Diagnosis present

## 2023-04-18 DIAGNOSIS — E039 Hypothyroidism, unspecified: Secondary | ICD-10-CM | POA: Diagnosis present

## 2023-04-18 DIAGNOSIS — E785 Hyperlipidemia, unspecified: Secondary | ICD-10-CM | POA: Diagnosis present

## 2023-04-18 DIAGNOSIS — M7989 Other specified soft tissue disorders: Secondary | ICD-10-CM | POA: Diagnosis not present

## 2023-04-18 DIAGNOSIS — M79606 Pain in leg, unspecified: Secondary | ICD-10-CM | POA: Diagnosis present

## 2023-04-18 DIAGNOSIS — Z86718 Personal history of other venous thrombosis and embolism: Secondary | ICD-10-CM | POA: Diagnosis not present

## 2023-04-18 DIAGNOSIS — G629 Polyneuropathy, unspecified: Secondary | ICD-10-CM | POA: Diagnosis present

## 2023-04-18 DIAGNOSIS — I878 Other specified disorders of veins: Secondary | ICD-10-CM | POA: Diagnosis present

## 2023-04-18 DIAGNOSIS — L03116 Cellulitis of left lower limb: Secondary | ICD-10-CM | POA: Diagnosis present

## 2023-04-18 DIAGNOSIS — I5022 Chronic systolic (congestive) heart failure: Secondary | ICD-10-CM | POA: Diagnosis not present

## 2023-04-18 DIAGNOSIS — D689 Coagulation defect, unspecified: Secondary | ICD-10-CM | POA: Diagnosis present

## 2023-04-18 DIAGNOSIS — I951 Orthostatic hypotension: Secondary | ICD-10-CM | POA: Diagnosis not present

## 2023-04-18 DIAGNOSIS — T502X5A Adverse effect of carbonic-anhydrase inhibitors, benzothiadiazides and other diuretics, initial encounter: Secondary | ICD-10-CM | POA: Diagnosis not present

## 2023-04-18 DIAGNOSIS — L03115 Cellulitis of right lower limb: Secondary | ICD-10-CM | POA: Diagnosis present

## 2023-04-18 DIAGNOSIS — K219 Gastro-esophageal reflux disease without esophagitis: Secondary | ICD-10-CM | POA: Diagnosis present

## 2023-04-18 DIAGNOSIS — D72829 Elevated white blood cell count, unspecified: Secondary | ICD-10-CM | POA: Diagnosis not present

## 2023-04-18 DIAGNOSIS — Z8619 Personal history of other infectious and parasitic diseases: Secondary | ICD-10-CM | POA: Diagnosis not present

## 2023-04-18 DIAGNOSIS — A419 Sepsis, unspecified organism: Secondary | ICD-10-CM | POA: Diagnosis present

## 2023-04-18 DIAGNOSIS — J4540 Moderate persistent asthma, uncomplicated: Secondary | ICD-10-CM | POA: Diagnosis present

## 2023-04-18 DIAGNOSIS — J439 Emphysema, unspecified: Secondary | ICD-10-CM | POA: Diagnosis present

## 2023-04-18 DIAGNOSIS — J438 Other emphysema: Secondary | ICD-10-CM | POA: Diagnosis not present

## 2023-04-18 DIAGNOSIS — Z8614 Personal history of Methicillin resistant Staphylococcus aureus infection: Secondary | ICD-10-CM | POA: Diagnosis not present

## 2023-04-18 DIAGNOSIS — I110 Hypertensive heart disease with heart failure: Secondary | ICD-10-CM | POA: Diagnosis present

## 2023-04-18 DIAGNOSIS — M109 Gout, unspecified: Secondary | ICD-10-CM | POA: Diagnosis present

## 2023-04-18 DIAGNOSIS — B965 Pseudomonas (aeruginosa) (mallei) (pseudomallei) as the cause of diseases classified elsewhere: Secondary | ICD-10-CM | POA: Diagnosis present

## 2023-04-18 DIAGNOSIS — L03114 Cellulitis of left upper limb: Secondary | ICD-10-CM | POA: Diagnosis not present

## 2023-04-18 DIAGNOSIS — Y92009 Unspecified place in unspecified non-institutional (private) residence as the place of occurrence of the external cause: Secondary | ICD-10-CM | POA: Diagnosis not present

## 2023-04-18 DIAGNOSIS — E876 Hypokalemia: Secondary | ICD-10-CM | POA: Diagnosis present

## 2023-04-18 DIAGNOSIS — E7800 Pure hypercholesterolemia, unspecified: Secondary | ICD-10-CM | POA: Diagnosis not present

## 2023-04-18 DIAGNOSIS — N400 Enlarged prostate without lower urinary tract symptoms: Secondary | ICD-10-CM | POA: Diagnosis present

## 2023-04-18 DIAGNOSIS — N401 Enlarged prostate with lower urinary tract symptoms: Secondary | ICD-10-CM | POA: Diagnosis not present

## 2023-04-18 DIAGNOSIS — W228XXA Striking against or struck by other objects, initial encounter: Secondary | ICD-10-CM | POA: Diagnosis present

## 2023-04-18 DIAGNOSIS — I5042 Chronic combined systolic (congestive) and diastolic (congestive) heart failure: Secondary | ICD-10-CM | POA: Diagnosis present

## 2023-04-18 DIAGNOSIS — I480 Paroxysmal atrial fibrillation: Secondary | ICD-10-CM | POA: Diagnosis present

## 2023-04-18 LAB — COMPREHENSIVE METABOLIC PANEL
ALT: 14 U/L (ref 0–44)
AST: 16 U/L (ref 15–41)
Albumin: 2.6 g/dL — ABNORMAL LOW (ref 3.5–5.0)
Alkaline Phosphatase: 69 U/L (ref 38–126)
Anion gap: 6 (ref 5–15)
BUN: 14 mg/dL (ref 8–23)
CO2: 23 mmol/L (ref 22–32)
Calcium: 8.4 mg/dL — ABNORMAL LOW (ref 8.9–10.3)
Chloride: 103 mmol/L (ref 98–111)
Creatinine, Ser: 0.88 mg/dL (ref 0.61–1.24)
GFR, Estimated: >60 mL/min (ref >60)
Glucose, Bld: 84 mg/dL (ref 70–99)
Potassium: 3.4 mmol/L — ABNORMAL LOW (ref 3.5–5.1)
Sodium: 132 mmol/L — ABNORMAL LOW (ref 135–145)
Total Bilirubin: 1.1 mg/dL (ref <1.2)
Total Protein: 5 g/dL — ABNORMAL LOW (ref 6.5–8.1)

## 2023-04-18 LAB — CBC
HCT: 32.4 % — ABNORMAL LOW (ref 39.0–52.0)
Hemoglobin: 10.4 g/dL — ABNORMAL LOW (ref 13.0–17.0)
MCH: 29.9 pg (ref 26.0–34.0)
MCHC: 32.1 g/dL (ref 30.0–36.0)
MCV: 93.1 fL (ref 80.0–100.0)
Platelets: 142 10*3/uL — ABNORMAL LOW (ref 150–400)
RBC: 3.48 MIL/uL — ABNORMAL LOW (ref 4.22–5.81)
RDW: 17.4 % — ABNORMAL HIGH (ref 11.5–15.5)
WBC: 13.4 10*3/uL — ABNORMAL HIGH (ref 4.0–10.5)
nRBC: 0 % (ref 0.0–0.2)

## 2023-04-18 MED ORDER — FUROSEMIDE 40 MG PO TABS
40.0000 mg | ORAL_TABLET | Freq: Every day | ORAL | Status: DC
  Administered 2023-04-18 – 2023-04-20 (×3): 40 mg via ORAL
  Filled 2023-04-18 (×3): qty 1

## 2023-04-18 MED ORDER — POTASSIUM CHLORIDE CRYS ER 20 MEQ PO TBCR
40.0000 meq | EXTENDED_RELEASE_TABLET | Freq: Once | ORAL | Status: AC
  Administered 2023-04-18: 40 meq via ORAL
  Filled 2023-04-18: qty 2

## 2023-04-18 NOTE — Discharge Instructions (Signed)

## 2023-04-18 NOTE — Progress Notes (Signed)
Mobility Specialist Progress Note;   04/18/23 1045  Mobility  Activity Transferred from chair to bed  Level of Assistance Minimal assist, patient does 75% or more  Assistive Device Cane  Activity Response Tolerated well  Mobility Referral Yes  $Mobility charge 1 Mobility  Mobility Specialist Start Time (ACUTE ONLY) 1045  Mobility Specialist Stop Time (ACUTE ONLY) 1055  Mobility Specialist Time Calculation (min) (ACUTE ONLY) 10 min   Pt requesting assistance back to bed, agreeable to mobility. Required minA for STS and for pivot steps towards bed. VSS throughout. C/o feeling nauseated. Pt left in bed w/ LLE elevated with all needs met. Bed alarm on.   Caesar Bookman Mobility Specialist Please contact via SecureChat or Rehab Office [phone]

## 2023-04-18 NOTE — Consult Note (Addendum)
WOC Nurse Consult Note: this consult performed remotely utilizing EMR including photo documentation; patient is familiar to [facility] team from previous admission; last seen by [facility] 03/20/2023 for venous ulcers   Patient with longstanding history of venous insufficiency with recurrent venous ulcers; followed most recently by primary MD although [facility] in 08/2021; per last note by primary MD patient not compliant with compression  Reason for Consult: wounds on legs  Wound type: full thickness ulcers B anterior lower legs likely r/t venous insufficiency  Pressure Injury POA: NA  Measurement: per nursing flowsheet  Wound bed:  100% dark hemorrhagic tissue; one ulcer R upper lateral aspect with yellow slough  Drainage (amount, consistency, odor) per nursing flowsheet  Periwound:  bilateral legs edematous, erythematous;  evidence of prior healed venous ulcers on bilateral legs Dressing procedure/placement/frequency: Clean wounds to B lower legs with NS, apply Xeroform gauze [place] [phone]) to wound beds daily, cover with ABD pads.  Wrap bilateral legs with Kerlix roll gauze beginning just above toes and ending right below knees.  Secure with Ace bandage in same fashion as Kerlix for light compression. Patient should keep legs elevated as much as possible.    Discussed with patient on previous visit that venous insufficiency is a condition that must be managed.   Discussed the importance of compression and follow-up with wound care center/vascular/primary MD at discharge.   POC discussed with primary nurse. WOC team will not follow. Re-consult if further needs arise.   Thank you,    Priscella Mann MSN, RN-BC, [employer] [phone]

## 2023-04-18 NOTE — Progress Notes (Signed)
PROGRESS NOTE        PATIENT DETAILS Name: Levi [name] Age: 77 y.o. Sex: male Date of Birth: 1945/10/12 Admit Date: 04/17/2023 Admitting Physician Synetta Fail, MD [id number], [name], MD  Brief Summary: Patient is a 77 y.o.  male with recent history of MSSA bacteremia-completed a course of IV Ancef-and then p.o. cefadroxil x 10 days (completed 5 days prior to this hospitalization) presented with worsening leg wound/cellulitis following trauma (struck both leg against oven) to his bilateral legs.  Patient was found to have leukocytosis/cellulitis of bilateral lower extremities and subsequently admitted to the hospitalist service.  Significant events: 11/21>> admit to [facility]  Significant studies: None  Significant microbiology data: 11/21>> blood culture: Anemia no growth 11/22>> swab right/left leg: Pending  Procedures: None  Consults: None  Subjective: Sitting at bedside chair-both legs swollen/erythematous.  Objective: Vitals: Blood pressure 117/82, pulse (!) 111, temperature 98.6 F (37 C), temperature source Oral, resp. rate (!) 21, height 6\' 2"  (1.88 m), weight 87.5 kg, SpO2 95%.   Exam: Gen Exam:Alert awake-not in any distress HEENT:atraumatic, normocephalic Chest: B/L clear to auscultation anteriorly CVS:S1S2 regular Abdomen:soft non tender, non distended Extremities: Both lower extremities-erythema up to upper leg-swelling left> right Neurology: Non focal Skin: no rash  Pertinent Labs/Radiology:    Latest Ref Rng & Units 04/18/2023    4:16 AM 04/17/2023   12:28 PM 04/17/2023    9:09 AM  CBC  WBC 4.0 - 10.5 K/uL 13.4  19.8  21.0   Hemoglobin 13.0 - 17.0 g/dL 91.4  78.2  95.6   Hematocrit 39.0 - 52.0 % 32.4  37.4  38.2   Platelets 150 - 400 K/uL 142  179  202     Lab Results  Component Value Date   NA 132 (L) 04/18/2023   K 3.4 (L) 04/18/2023   CL 103 04/18/2023   CO2 23 04/18/2023       Assessment/Plan: Sepsis secondary to soft tissue infection-cellulitis-of bilateral lower extremities superimposed on chronic venous stasis (POA) Bilateral lower extremities appears essentially unchanged Afebrile overnight-leukocytosis downtrending-nontoxic appearance Cultures negative so far Needs both legs compressed/elevated-significant venous component suspected Continue IV antibiotics-reassess 11/23.  Bilateral lower extremity wounds Appreciate wound care input  Recent history of MSSA bacteremia Completed IV Ancef-then oral cefadroxil Repeat blood cultures 11/21 negative so far  Hypokalemia Replete/recheck  Chronic combined systolic and diastolic CHF (EF 21-30% by TEE on 10/28) Relatively stable-leg edema is likely from venous stasis/cellulitis and not from CHF exacerbation Lasix while hospitalized to help with leg edema normally not on diuretics at home  Hypertension BP stable Continue Cardizem/Imdur   Hyperlipidemia atorvastatin   GERD  PPI   Paroxysmal atrial fibrillation Telemetry monitoring Cardizem/Eliquis   Peripheral neuropathy Lyrica/Cymbalta  Gout Not in exacerbation Allopurinol   History of superficial venous thrombosis On Eliquis for A-fib   COPD Asthma Not in exacerbation Bronchodilators   BPH tamsulosin   Lumbar stenosis Positive care-follow-up with PCP  BMI: Estimated body mass index is 24.78 kg/m as calculated from the following:   Height as of this encounter: 6\' 2"  (1.88 m).   Weight as of this encounter: 87.5 kg.   Code status:   Code Status: Full Code   DVT Prophylaxis: apixaban (ELIQUIS) tablet 5 mg     Family Communication: None at bedside   Disposition Plan: Status is: Observation The  patient will require care spanning > 2 midnights and should be moved to inpatient because: Severity of illness   Planned Discharge Destination:Home   Diet: Diet Order             Diet regular Room service appropriate?  Yes; Fluid consistency: Thin  Diet effective now                     Antimicrobial agents: Anti-infectives (From admission, onward)    Start     Dose/Rate Route Frequency Ordered Stop   04/18/23 1000  cefTRIAXone (ROCEPHIN) 2 g in sodium chloride 0.9 % 100 mL IVPB        2 g 200 mL/hr over 30 Minutes Intravenous Every 24 hours 04/17/23 1606     04/17/23 1330  cefTRIAXone (ROCEPHIN) 1 g in sodium chloride 0.9 % 100 mL IVPB        1 g 200 mL/hr over 30 Minutes Intravenous  Once 04/17/23 1329 04/17/23 1444        MEDICATIONS: Scheduled Meds:  allopurinol  100 mg Oral q AM   apixaban  5 mg Oral BID   arformoterol  15 mcg Nebulization BID   atorvastatin  20 mg Oral Daily   diltiazem  180 mg Oral Daily   DULoxetine  30 mg Oral Daily   isosorbide mononitrate  30 mg Oral Daily   pantoprazole  40 mg Oral BID AC   pregabalin  75 mg Oral BID   sodium chloride flush  3 mL Intravenous Q12H   tamsulosin  0.4 mg Oral QHS   Continuous Infusions:  cefTRIAXone (ROCEPHIN)  IV 2 g (04/18/23 0818)   PRN Meds:.acetaminophen **OR** acetaminophen, albuterol, oxyCODONE, polyethylene glycol   I have personally reviewed following labs and imaging studies  LABORATORY DATA: CBC: Recent Labs  Lab 04/17/23 0909 04/17/23 1228 04/18/23 0416  WBC 21.0* 19.8* 13.4*  NEUTROABS 17.6* 16.1*  --   HGB 12.5* 11.7* 10.4*  HCT 38.2 37.4* 32.4*  MCV 94 96.6 93.1  PLT 202 179 142*    Basic Metabolic Panel: Recent Labs  Lab 04/17/23 0909 04/17/23 1228 04/18/23 0416  NA 136 135 132*  K 5.1 3.5 3.4*  CL 99 102 103  CO2 25 24 23   GLUCOSE 105* 127* 84  BUN 16 15 14   CREATININE 0.91 0.94 0.88  CALCIUM 9.8 9.0 8.4*    GFR: Estimated Creatinine Clearance: 83 mL/min (by C-G formula based on SCr of 0.88 mg/dL).  Liver Function Tests: Recent Labs  Lab 04/17/23 0909 04/17/23 1228 04/18/23 0416  AST 20 21 16   ALT 14 17 14   ALKPHOS 116 83 69  BILITOT 0.7 1.1 1.1  PROT 5.9* 5.8* 5.0*   ALBUMIN 4.0 3.1* 2.6*   No results for input(s): "LIPASE", "AMYLASE" in the last 168 hours. No results for input(s): "AMMONIA" in the last 168 hours.  Coagulation Profile: No results for input(s): "INR", "PROTIME" in the last 168 hours.  Cardiac Enzymes: No results for input(s): "CKTOTAL", "CKMB", "CKMBINDEX", "TROPONINI" in the last 168 hours.  BNP (last 3 results) No results for input(s): "PROBNP" in the last 8760 hours.  Lipid Profile: No results for input(s): "CHOL", "HDL", "LDLCALC", "TRIG", "CHOLHDL", "LDLDIRECT" in the last 72 hours.  Thyroid Function Tests: No results for input(s): "TSH", "T4TOTAL", "FREET4", "T3FREE", "THYROIDAB" in the last 72 hours.  Anemia Panel: No results for input(s): "VITAMINB12", "FOLATE", "FERRITIN", "TIBC", "IRON", "RETICCTPCT" in the last 72 hours.  Urine analysis:    Component  Value Date/Time   COLORURINE YELLOW 04/17/2023 1228   APPEARANCEUR CLEAR 04/17/2023 1228   APPEARANCEUR Clear 03/18/2023 1411   LABSPEC 1.013 04/17/2023 1228   PHURINE 6.0 04/17/2023 1228   GLUCOSEU NEGATIVE 04/17/2023 1228   HGBUR SMALL (A) 04/17/2023 1228   BILIRUBINUR NEGATIVE 04/17/2023 1228   BILIRUBINUR Negative 03/18/2023 1411   KETONESUR NEGATIVE 04/17/2023 1228   PROTEINUR NEGATIVE 04/17/2023 1228   UROBILINOGEN 0.2 05/02/2022 0916   UROBILINOGEN 0.2 04/21/2012 0936   NITRITE NEGATIVE 04/17/2023 1228   LEUKOCYTESUR TRACE (A) 04/17/2023 1228    Sepsis Labs: Lactic Acid, Venous    Component Value Date/Time   LATICACIDVEN 1.0 04/17/2023 1515    MICROBIOLOGY: Recent Results (from the past 240 hour(s))  Blood culture (routine x 2)     Status: None (Preliminary result)   Collection Time: 04/17/23  1:58 PM   Specimen: BLOOD RIGHT ARM  Result Value Ref Range Status   Specimen Description BLOOD RIGHT ARM  Final   Special Requests   Final    BOTTLES DRAWN AEROBIC AND ANAEROBIC Blood Culture adequate volume   Culture   Final    NO GROWTH < 24  HOURS Performed at [facility], [address] [place], [place] [address]    Report Status PENDING  Incomplete  Blood culture (routine x 2)     Status: None (Preliminary result)   Collection Time: 04/17/23  8:14 PM   Specimen: BLOOD  Result Value Ref Range Status   Specimen Description BLOOD SITE NOT SPECIFIED  Final   Special Requests   Final    BOTTLES DRAWN AEROBIC AND ANAEROBIC Blood Culture results may not be optimal due to an inadequate volume of blood received in culture bottles   Culture   Final    NO GROWTH < 12 HOURS Performed at [facility], [address] [place], [place] [address]    Report Status PENDING  Incomplete  Aerobic Culture w Gram Stain (superficial specimen)     Status: None (Preliminary result)   Collection Time: 04/18/23 12:10 AM   Specimen: Wound  Result Value Ref Range Status   Specimen Description WOUND  Final   Special Requests LEFT LEG  Final   Gram Stain   Final    FEW WBC PRESENT,BOTH PMN AND MONONUCLEAR NO ORGANISMS SEEN Performed at [facility], [address], [place], [place] [address]    Culture PENDING  Incomplete   Report Status PENDING  Incomplete  Aerobic Culture w Gram Stain (superficial specimen)     Status: None (Preliminary result)   Collection Time: 04/18/23 12:40 AM   Specimen: Wound  Result Value Ref Range Status   Specimen Description WOUND  Final   Special Requests RIGHT LEG  Final   Gram Stain   Final    RARE WBC PRESENT, PREDOMINANTLY MONONUCLEAR NO ORGANISMS SEEN Performed at [facility], [address] [place], [place] [address]    Culture PENDING  Incomplete   Report Status PENDING  Incomplete    RADIOLOGY STUDIES/RESULTS: No results found.   LOS: 0 days   Jeoffrey Massed, MD  [facility]    To contact the attending provider between 7A-7P or the covering provider during after hours 7P-7A, please log into the web site [email or web address] and access using universal [place]  password for that web site. If you do not have the password, please call the hospital operator.  04/18/2023, 8:52 AM

## 2023-04-18 NOTE — Progress Notes (Signed)
Mobility Specialist Progress Note;   04/18/23 0910  Mobility  Activity Ambulated with assistance in hallway  Level of Assistance Minimal assist, patient does 75% or more  Assistive Device Cane  Distance Ambulated (ft) 75 ft  Activity Response Tolerated well  Mobility Referral Yes  $Mobility charge 1 Mobility  Mobility Specialist Start Time (ACUTE ONLY) 0910  Mobility Specialist Stop Time (ACUTE ONLY) 0930  Mobility Specialist Time Calculation (min) (ACUTE ONLY) 20 min    During-mobility: Max HR 122 Post-mobility: HR 111; BP 117/76 (85)  Pt agreeable to mobility. Required minA for STS and during ambulation. Displayed LLE swelling and redness, however no pain while ambulating. Took 1x standing rest break d/t lightheadedness and increased HR. 1 LOB noted requiring minA to recover. Resting BP taken once back in chair (see above). Pt left in chair with all needs met, BLE elevated with pillows. Alarm on.   Caesar Bookman Mobility Specialist Please contact via SecureChat or Rehab Office [phone]

## 2023-04-19 DIAGNOSIS — I480 Paroxysmal atrial fibrillation: Secondary | ICD-10-CM | POA: Diagnosis not present

## 2023-04-19 DIAGNOSIS — L03116 Cellulitis of left lower limb: Secondary | ICD-10-CM | POA: Diagnosis not present

## 2023-04-19 DIAGNOSIS — N401 Enlarged prostate with lower urinary tract symptoms: Secondary | ICD-10-CM | POA: Diagnosis not present

## 2023-04-19 DIAGNOSIS — I5022 Chronic systolic (congestive) heart failure: Secondary | ICD-10-CM | POA: Diagnosis not present

## 2023-04-19 LAB — CBC
HCT: 33.1 % — ABNORMAL LOW (ref 39.0–52.0)
Hemoglobin: 10.5 g/dL — ABNORMAL LOW (ref 13.0–17.0)
MCH: 29.7 pg (ref 26.0–34.0)
MCHC: 31.7 g/dL (ref 30.0–36.0)
MCV: 93.8 fL (ref 80.0–100.0)
Platelets: 138 10*3/uL — ABNORMAL LOW (ref 150–400)
RBC: 3.53 MIL/uL — ABNORMAL LOW (ref 4.22–5.81)
RDW: 17.2 % — ABNORMAL HIGH (ref 11.5–15.5)
WBC: 11.4 10*3/uL — ABNORMAL HIGH (ref 4.0–10.5)
nRBC: 0 % (ref 0.0–0.2)

## 2023-04-19 LAB — BASIC METABOLIC PANEL
Anion gap: 12 (ref 5–15)
BUN: 14 mg/dL (ref 8–23)
CO2: 23 mmol/L (ref 22–32)
Calcium: 8.4 mg/dL — ABNORMAL LOW (ref 8.9–10.3)
Chloride: 97 mmol/L — ABNORMAL LOW (ref 98–111)
Creatinine, Ser: 0.79 mg/dL (ref 0.61–1.24)
GFR, Estimated: >60 mL/min (ref >60)
Glucose, Bld: 89 mg/dL (ref 70–99)
Potassium: 3.4 mmol/L — ABNORMAL LOW (ref 3.5–5.1)
Sodium: 132 mmol/L — ABNORMAL LOW (ref 135–145)

## 2023-04-19 MED ORDER — POTASSIUM CHLORIDE CRYS ER 20 MEQ PO TBCR
40.0000 meq | EXTENDED_RELEASE_TABLET | ORAL | Status: AC
  Administered 2023-04-19 (×2): 40 meq via ORAL
  Filled 2023-04-19: qty 2

## 2023-04-19 NOTE — Progress Notes (Signed)
PROGRESS NOTE        PATIENT DETAILS Name: Levi [name] Age: 77 y.o. Sex: male Date of Birth: Dec 27, 1945 Admit Date: 04/17/2023 Admitting Physician Synetta Fail, MD [id number], [name], MD  Brief Summary: Patient is a 77 y.o.  male with recent history of MSSA bacteremia-completed a course of IV Ancef-and then p.o. cefadroxil x 10 days (completed 5 days prior to this hospitalization) presented with worsening leg wound/cellulitis following trauma (struck both leg against oven) to his bilateral legs.  Patient was found to have leukocytosis/cellulitis of bilateral lower extremities and subsequently admitted to the hospitalist service.  Significant events: 11/21>> admit to [facility]  Significant studies: None  Significant microbiology data: 11/21>> blood culture:no growth 11/22>> culture of swab right/left leg: No growth  Procedures: None  Consults: None  Subjective: No complaints-claims when the leg was unwrapped earlier this morning-swelling/erythema had improved but not yet back to baseline.  Objective: Vitals: Blood pressure (!) 140/77, pulse (!) 102, temperature 98 F (36.7 C), temperature source Oral, resp. rate 15, height 6\' 2"  (1.88 m), weight 87.5 kg, SpO2 100%.   Exam: Gen Exam:Alert awake-not in any distress HEENT:atraumatic, normocephalic Chest: B/L clear to auscultation anteriorly CVS:S1S2 regular Abdomen:soft non tender, non distended Extremities: both legs wrapped. Neurology: Non focal Skin: no rash  Pertinent Labs/Radiology:    Latest Ref Rng & Units 04/19/2023    4:25 AM 04/18/2023    4:16 AM 04/17/2023   12:28 PM  CBC  WBC 4.0 - 10.5 K/uL 11.4  13.4  19.8   Hemoglobin 13.0 - 17.0 g/dL 69.6  29.5  28.4   Hematocrit 39.0 - 52.0 % 33.1  32.4  37.4   Platelets 150 - 400 K/uL 138  142  179     Lab Results  Component Value Date   NA 132 (L) 04/19/2023   K 3.4 (L) 04/19/2023   CL 97 (L) 04/19/2023   CO2 23  04/19/2023      Assessment/Plan: Sepsis secondary to soft tissue infection-cellulitis-of bilateral lower extremities superimposed on chronic venous stasis (POA) Overall stable-leukocytosis downtrending Cultures negative so far Continue IV Rocephin Continue compression dressings Continue diuretics Continue to keep both legs elevated Reassess 11/24.  Bilateral lower extremity wounds Appreciate wound care input  Recent history of MSSA bacteremia Completed IV Ancef-then oral cefadroxil Repeat blood cultures 11/21 negative so far  Hypokalemia Replete/recheck.  Chronic combined systolic and diastolic CHF (EF 13-24% by TEE on 10/28) Compensated Not on diuretics at home-but currently on Lasix to help with leg edema from venous stasis/cellulitis.  Hypertension BP stable Continue Cardizem/Imdur   Hyperlipidemia atorvastatin   GERD  PPI   Paroxysmal atrial fibrillation Telemetry monitoring Cardizem/Eliquis   Peripheral neuropathy Lyrica/Cymbalta  Gout Not in exacerbation Allopurinol   History of superficial venous thrombosis On Eliquis for A-fib   COPD Asthma Not in exacerbation Bronchodilators   BPH tamsulosin   Lumbar stenosis Positive care-follow-up with PCP  BMI: Estimated body mass index is 24.78 kg/m as calculated from the following:   Height as of this encounter: 6\' 2"  (1.88 m).   Weight as of this encounter: 87.5 kg.   Code status:   Code Status: Full Code   DVT Prophylaxis: apixaban (ELIQUIS) tablet 5 mg     Family Communication: None at bedside   Disposition Plan: Status is: Observation The patient will require care  spanning > 2 midnights and should be moved to inpatient because: Severity of illness   Planned Discharge Destination:Home   Diet: Diet Order             Diet regular Room service appropriate? Yes; Fluid consistency: Thin  Diet effective now                     Antimicrobial agents: Anti-infectives (From  admission, onward)    Start     Dose/Rate Route Frequency Ordered Stop   04/18/23 1000  cefTRIAXone (ROCEPHIN) 2 g in sodium chloride 0.9 % 100 mL IVPB        2 g 200 mL/hr over 30 Minutes Intravenous Every 24 hours 04/17/23 1606     04/17/23 1330  cefTRIAXone (ROCEPHIN) 1 g in sodium chloride 0.9 % 100 mL IVPB        1 g 200 mL/hr over 30 Minutes Intravenous  Once 04/17/23 1329 04/17/23 1444        MEDICATIONS: Scheduled Meds:  allopurinol  100 mg Oral q AM   apixaban  5 mg Oral BID   arformoterol  15 mcg Nebulization BID   atorvastatin  20 mg Oral Daily   diltiazem  180 mg Oral Daily   DULoxetine  30 mg Oral Daily   furosemide  40 mg Oral Daily   isosorbide mononitrate  30 mg Oral Daily   pantoprazole  40 mg Oral BID AC   pregabalin  75 mg Oral BID   sodium chloride flush  3 mL Intravenous Q12H   tamsulosin  0.4 mg Oral QHS   Continuous Infusions:  cefTRIAXone (ROCEPHIN)  IV 2 g (04/19/23 0908)   PRN Meds:.acetaminophen **OR** acetaminophen, albuterol, oxyCODONE, polyethylene glycol   I have personally reviewed following labs and imaging studies  LABORATORY DATA: CBC: Recent Labs  Lab 04/17/23 0909 04/17/23 1228 04/18/23 0416 04/19/23 0425  WBC 21.0* 19.8* 13.4* 11.4*  NEUTROABS 17.6* 16.1*  --   --   HGB 12.5* 11.7* 10.4* 10.5*  HCT 38.2 37.4* 32.4* 33.1*  MCV 94 96.6 93.1 93.8  PLT 202 179 142* 138*    Basic Metabolic Panel: Recent Labs  Lab 04/17/23 0909 04/17/23 1228 04/18/23 0416 04/19/23 0425  NA 136 135 132* 132*  K 5.1 3.5 3.4* 3.4*  CL 99 102 103 97*  CO2 25 24 23 23   GLUCOSE 105* 127* 84 89  BUN 16 15 14 14   CREATININE 0.91 0.94 0.88 0.79  CALCIUM 9.8 9.0 8.4* 8.4*    GFR: Estimated Creatinine Clearance: 91.3 mL/min (by C-G formula based on SCr of 0.79 mg/dL).  Liver Function Tests: Recent Labs  Lab 04/17/23 0909 04/17/23 1228 04/18/23 0416  AST 20 21 16   ALT 14 17 14   ALKPHOS 116 83 69  BILITOT 0.7 1.1 1.1  PROT 5.9* 5.8*  5.0*  ALBUMIN 4.0 3.1* 2.6*   No results for input(s): "LIPASE", "AMYLASE" in the last 168 hours. No results for input(s): "AMMONIA" in the last 168 hours.  Coagulation Profile: No results for input(s): "INR", "PROTIME" in the last 168 hours.  Cardiac Enzymes: No results for input(s): "CKTOTAL", "CKMB", "CKMBINDEX", "TROPONINI" in the last 168 hours.  BNP (last 3 results) No results for input(s): "PROBNP" in the last 8760 hours.  Lipid Profile: No results for input(s): "CHOL", "HDL", "LDLCALC", "TRIG", "CHOLHDL", "LDLDIRECT" in the last 72 hours.  Thyroid Function Tests: No results for input(s): "TSH", "T4TOTAL", "FREET4", "T3FREE", "THYROIDAB" in the last 72 hours.  Anemia Panel: No results for input(s): "VITAMINB12", "FOLATE", "FERRITIN", "TIBC", "IRON", "RETICCTPCT" in the last 72 hours.  Urine analysis:    Component Value Date/Time   COLORURINE YELLOW 04/17/2023 1228   APPEARANCEUR CLEAR 04/17/2023 1228   APPEARANCEUR Clear 03/18/2023 1411   LABSPEC 1.013 04/17/2023 1228   PHURINE 6.0 04/17/2023 1228   GLUCOSEU NEGATIVE 04/17/2023 1228   HGBUR SMALL (A) 04/17/2023 1228   BILIRUBINUR NEGATIVE 04/17/2023 1228   BILIRUBINUR Negative 03/18/2023 1411   KETONESUR NEGATIVE 04/17/2023 1228   PROTEINUR NEGATIVE 04/17/2023 1228   UROBILINOGEN 0.2 05/02/2022 0916   UROBILINOGEN 0.2 04/21/2012 0936   NITRITE NEGATIVE 04/17/2023 1228   LEUKOCYTESUR TRACE (A) 04/17/2023 1228    Sepsis Labs: Lactic Acid, Venous    Component Value Date/Time   LATICACIDVEN 1.0 04/17/2023 1515    MICROBIOLOGY: Recent Results (from the past 240 hour(s))  Blood culture (routine x 2)     Status: None (Preliminary result)   Collection Time: 04/17/23  1:58 PM   Specimen: BLOOD RIGHT ARM  Result Value Ref Range Status   Specimen Description BLOOD RIGHT ARM  Final   Special Requests   Final    BOTTLES DRAWN AEROBIC AND ANAEROBIC Blood Culture adequate volume   Culture   Final    NO GROWTH 2  DAYS Performed at [facility], [address], [place], [place] [address]    Report Status PENDING  Incomplete  Blood culture (routine x 2)     Status: None (Preliminary result)   Collection Time: 04/17/23  8:14 PM   Specimen: BLOOD  Result Value Ref Range Status   Specimen Description BLOOD SITE NOT SPECIFIED  Final   Special Requests   Final    BOTTLES DRAWN AEROBIC AND ANAEROBIC Blood Culture results may not be optimal due to an inadequate volume of blood received in culture bottles   Culture   Final    NO GROWTH 2 DAYS Performed at [facility], [address] [place], [place] [address]    Report Status PENDING  Incomplete  Aerobic Culture w Gram Stain (superficial specimen)     Status: None (Preliminary result)   Collection Time: 04/18/23 12:10 AM   Specimen: Wound  Result Value Ref Range Status   Specimen Description WOUND  Final   Special Requests LEFT LEG  Final   Gram Stain   Final    FEW WBC PRESENT,BOTH PMN AND MONONUCLEAR NO ORGANISMS SEEN Performed at [facility], [address] [place], [place] [address]    Culture PENDING  Incomplete   Report Status PENDING  Incomplete  Aerobic Culture w Gram Stain (superficial specimen)     Status: None (Preliminary result)   Collection Time: 04/18/23 12:40 AM   Specimen: Wound  Result Value Ref Range Status   Specimen Description WOUND  Final   Special Requests RIGHT LEG  Final   Gram Stain   Final    RARE WBC PRESENT, PREDOMINANTLY MONONUCLEAR NO ORGANISMS SEEN Performed at [facility], [address] [place], [place] [address]    Culture PENDING  Incomplete   Report Status PENDING  Incomplete    RADIOLOGY STUDIES/RESULTS: No results found.   LOS: 1 day   Jeoffrey Massed, MD  [facility]    To contact the attending provider between 7A-7P or the covering provider during after hours 7P-7A, please log into the web site [email or web address] and access using universal [place]  password for that web site.  If you do not have the password, please call the hospital operator.  04/19/2023, 12:37 PM

## 2023-04-20 DIAGNOSIS — A419 Sepsis, unspecified organism: Secondary | ICD-10-CM | POA: Diagnosis not present

## 2023-04-20 DIAGNOSIS — L03116 Cellulitis of left lower limb: Secondary | ICD-10-CM | POA: Diagnosis not present

## 2023-04-20 DIAGNOSIS — I480 Paroxysmal atrial fibrillation: Secondary | ICD-10-CM | POA: Diagnosis not present

## 2023-04-20 DIAGNOSIS — E7800 Pure hypercholesterolemia, unspecified: Secondary | ICD-10-CM | POA: Diagnosis not present

## 2023-04-20 LAB — BASIC METABOLIC PANEL
Anion gap: 9 (ref 5–15)
BUN: 13 mg/dL (ref 8–23)
CO2: 24 mmol/L (ref 22–32)
Calcium: 8.4 mg/dL — ABNORMAL LOW (ref 8.9–10.3)
Chloride: 98 mmol/L (ref 98–111)
Creatinine, Ser: 0.87 mg/dL (ref 0.61–1.24)
GFR, Estimated: >60 mL/min (ref >60)
Glucose, Bld: 98 mg/dL (ref 70–99)
Potassium: 3.8 mmol/L (ref 3.5–5.1)
Sodium: 131 mmol/L — ABNORMAL LOW (ref 135–145)

## 2023-04-20 LAB — CBC
HCT: 32.4 % — ABNORMAL LOW (ref 39.0–52.0)
Hemoglobin: 10.3 g/dL — ABNORMAL LOW (ref 13.0–17.0)
MCH: 29.7 pg (ref 26.0–34.0)
MCHC: 31.8 g/dL (ref 30.0–36.0)
MCV: 93.4 fL (ref 80.0–100.0)
Platelets: 149 10*3/uL — ABNORMAL LOW (ref 150–400)
RBC: 3.47 MIL/uL — ABNORMAL LOW (ref 4.22–5.81)
RDW: 16.8 % — ABNORMAL HIGH (ref 11.5–15.5)
WBC: 7.8 10*3/uL (ref 4.0–10.5)
nRBC: 0 % (ref 0.0–0.2)

## 2023-04-20 MED ORDER — CIPROFLOXACIN HCL 500 MG PO TABS
500.0000 mg | ORAL_TABLET | Freq: Two times a day (BID) | ORAL | Status: DC
  Administered 2023-04-20 – 2023-04-21 (×2): 500 mg via ORAL
  Filled 2023-04-20 (×2): qty 1

## 2023-04-20 MED ORDER — LEVOFLOXACIN 750 MG PO TABS
750.0000 mg | ORAL_TABLET | Freq: Every day | ORAL | Status: DC
  Filled 2023-04-20: qty 1

## 2023-04-20 NOTE — Progress Notes (Signed)
Mobility Specialist Progress Note    04/20/23 1242  Mobility  Activity Ambulated with assistance in hallway  Level of Assistance Contact guard assist, steadying assist  Assistive Device Front wheel walker  Distance Ambulated (ft) 100 ft (50+50)  Activity Response Tolerated fair  Mobility Referral Yes  $Mobility charge 1 Mobility  Mobility Specialist Start Time (ACUTE ONLY) 1226  Mobility Specialist Stop Time (ACUTE ONLY) 1241  Mobility Specialist Time Calculation (min) (ACUTE ONLY) 15 min   Pre-Mobility: 108 HR, 90/58 (67) BP Post-Mobility: 106 HR, 99/66 (76) BP  Pt received in chair and agreeable. C/o leg pain. During walk, pt c/o being lightheaded. Took x1 seated rest break. Returned to chair with call bell in reach. RN notified.   [place] Mobility Specialist  Please Neurosurgeon or Rehab Office at [phone]

## 2023-04-20 NOTE — Plan of Care (Signed)
Problem: Education: Goal: Knowledge of General Education information will improve Description: Including pain rating scale, medication(s)/side effects and non-pharmacologic comfort measures Outcome: Progressing   Problem: Health Behavior/Discharge Planning: Goal: Ability to manage health-related needs will improve Outcome: Progressing   Problem: Clinical Measurements: Goal: Ability to maintain clinical measurements within normal limits will improve Outcome: Progressing Goal: Will remain free from infection Outcome: Progressing Goal: Diagnostic test results will improve Outcome: Progressing Goal: Respiratory complications will improve Outcome: Progressing Goal: Cardiovascular complication will be avoided Outcome: Progressing   Problem: Coping: Goal: Level of anxiety will decrease Outcome: Progressing   Problem: Nutrition: Goal: Adequate nutrition will be maintained Outcome: Progressing   Problem: Activity: Goal: Risk for activity intolerance will decrease Outcome: Progressing   Problem: Elimination: Goal: Will not experience complications related to bowel motility Outcome: Progressing Goal: Will not experience complications related to urinary retention Outcome: Progressing

## 2023-04-20 NOTE — Progress Notes (Signed)
PROGRESS NOTE        PATIENT DETAILS Name: Levi [name] Age: 77 y.o. Sex: male Date of Birth: [birth year] Admit Date: 04/17/2023 Admitting Physician Synetta Fail, MD WGN:[id number], [name], MD  Brief Summary: Patient is a 77 y.o.  male with recent history of MSSA bacteremia-completed a course of IV Ancef-and then p.o. cefadroxil x 10 days (completed 5 days prior to this hospitalization) presented with worsening leg wound/cellulitis following trauma (struck both leg against oven) to his bilateral legs.  Patient was found to have leukocytosis/cellulitis of bilateral lower extremities and subsequently admitted to the hospitalist service.  Significant events: 11/21>> admit to [facility]  Significant studies: None  Significant microbiology data: 11/21>> blood culture:no growth 11/22>> culture of swab right/left leg: No growth  Procedures: None  Consults: None  Subjective: Feels better-no complaints-significant improvement in edema/erythema of his lower legs this morning  Objective: Vitals: Blood pressure (!) 101/54, pulse 82, temperature 98 F (36.7 C), temperature source Oral, resp. rate 18, height 6\' 2"  (1.88 m), weight 87.5 kg, SpO2 97%.   Exam: Awake/alert Bilateral lower extremities-see picture below    Pertinent Labs/Radiology:    Latest Ref Rng & Units 04/20/2023    3:43 AM 04/19/2023    4:25 AM 04/18/2023    4:16 AM  CBC  WBC 4.0 - 10.5 K/uL 7.8  11.4  13.4   Hemoglobin 13.0 - 17.0 g/dL 69.6  29.5  28.4   Hematocrit 39.0 - 52.0 % 32.4  33.1  32.4   Platelets 150 - 400 K/uL 149  138  142     Lab Results  Component Value Date   NA 131 (L) 04/20/2023   K 3.8 04/20/2023   CL 98 04/20/2023   CO2 24 04/20/2023      Assessment/Plan: Sepsis secondary to soft tissue infection-cellulitis-of bilateral lower extremities superimposed on chronic venous stasis (POA) Significant improvement in edema/erythema IV Rocephin for another  day-probably could be switched to oral antibiotics tomorrow Continue compressive dressings Continue diuretics Continue leg elevation Likely discharge 11/25 if clinical improvement continues  Bilateral lower extremity wounds Appreciate wound care input  Recent history of MSSA bacteremia Completed IV Ancef-then oral cefadroxil Repeat blood cultures 11/21 negative so far  Hypokalemia Repleted.  Chronic combined systolic and diastolic CHF (EF 13-24% by TEE on 10/28) Compensated Not on diuretics at home-but currently on Lasix to help with leg edema from venous stasis/cellulitis.  Hypertension BP stable Continue Cardizem/Imdur   Hyperlipidemia atorvastatin   GERD  PPI   Paroxysmal atrial fibrillation Telemetry monitoring Cardizem/Eliquis   Peripheral neuropathy Lyrica/Cymbalta  Gout Not in exacerbation Allopurinol   History of superficial venous thrombosis On Eliquis for A-fib   COPD Asthma Not in exacerbation Bronchodilators   BPH tamsulosin   Lumbar stenosis Supportive care-follow-up with PCP  BMI: Estimated body mass index is 24.78 kg/m as calculated from the following:   Height as of this encounter: 6\' 2"  (1.88 m).   Weight as of this encounter: 87.5 kg.   Code status:   Code Status: Full Code   DVT Prophylaxis: apixaban (ELIQUIS) tablet 5 mg     Family Communication: None at bedside   Disposition Plan: Status is: Observation The patient will require care spanning > 2 midnights and should be moved to inpatient because: Severity of illness   Planned Discharge Destination:Home   Diet: Diet  Order             Diet regular Room service appropriate? Yes; Fluid consistency: Thin  Diet effective now                     Antimicrobial agents: Anti-infectives (From admission, onward)    Start     Dose/Rate Route Frequency Ordered Stop   04/18/23 1000  cefTRIAXone (ROCEPHIN) 2 g in sodium chloride 0.9 % 100 mL IVPB        2 g 200  mL/hr over 30 Minutes Intravenous Every 24 hours 04/17/23 1606     04/17/23 1330  cefTRIAXone (ROCEPHIN) 1 g in sodium chloride 0.9 % 100 mL IVPB        1 g 200 mL/hr over 30 Minutes Intravenous  Once 04/17/23 1329 04/17/23 1444        MEDICATIONS: Scheduled Meds:  allopurinol  100 mg Oral q AM   apixaban  5 mg Oral BID   arformoterol  15 mcg Nebulization BID   atorvastatin  20 mg Oral Daily   diltiazem  180 mg Oral Daily   DULoxetine  30 mg Oral Daily   furosemide  40 mg Oral Daily   isosorbide mononitrate  30 mg Oral Daily   pantoprazole  40 mg Oral BID AC   pregabalin  75 mg Oral BID   sodium chloride flush  3 mL Intravenous Q12H   tamsulosin  0.4 mg Oral QHS   Continuous Infusions:  cefTRIAXone (ROCEPHIN)  IV 2 g (04/20/23 0830)   PRN Meds:.acetaminophen **OR** acetaminophen, albuterol, oxyCODONE, polyethylene glycol   I have personally reviewed following labs and imaging studies  LABORATORY DATA: CBC: Recent Labs  Lab 04/17/23 0909 04/17/23 1228 04/18/23 0416 04/19/23 0425 04/20/23 0343  WBC 21.0* 19.8* 13.4* 11.4* 7.8  NEUTROABS 17.6* 16.1*  --   --   --   HGB 12.5* 11.7* 10.4* 10.5* 10.3*  HCT 38.2 37.4* 32.4* 33.1* 32.4*  MCV 94 96.6 93.1 93.8 93.4  PLT 202 179 142* 138* 149*    Basic Metabolic Panel: Recent Labs  Lab 04/17/23 0909 04/17/23 1228 04/18/23 0416 04/19/23 0425 04/20/23 0343  NA 136 135 132* 132* 131*  K 5.1 3.5 3.4* 3.4* 3.8  CL 99 102 103 97* 98  CO2 25 24 23 23 24   GLUCOSE 105* 127* 84 89 98  BUN 16 15 14 14 13   CREATININE 0.91 0.94 0.88 0.79 0.87  CALCIUM 9.8 9.0 8.4* 8.4* 8.4*    GFR: Estimated Creatinine Clearance: 84 mL/min (by C-G formula based on SCr of 0.87 mg/dL).  Liver Function Tests: Recent Labs  Lab 04/17/23 0909 04/17/23 1228 04/18/23 0416  AST 20 21 16   ALT 14 17 14   ALKPHOS 116 83 69  BILITOT 0.7 1.1 1.1  PROT 5.9* 5.8* 5.0*  ALBUMIN 4.0 3.1* 2.6*   No results for input(s): "LIPASE", "AMYLASE" in  the last 168 hours. No results for input(s): "AMMONIA" in the last 168 hours.  Coagulation Profile: No results for input(s): "INR", "PROTIME" in the last 168 hours.  Cardiac Enzymes: No results for input(s): "CKTOTAL", "CKMB", "CKMBINDEX", "TROPONINI" in the last 168 hours.  BNP (last 3 results) No results for input(s): "PROBNP" in the last 8760 hours.  Lipid Profile: No results for input(s): "CHOL", "HDL", "LDLCALC", "TRIG", "CHOLHDL", "LDLDIRECT" in the last 72 hours.  Thyroid Function Tests: No results for input(s): "TSH", "T4TOTAL", "FREET4", "T3FREE", "THYROIDAB" in the last 72 hours.  Anemia Panel: No  results for input(s): "VITAMINB12", "FOLATE", "FERRITIN", "TIBC", "IRON", "RETICCTPCT" in the last 72 hours.  Urine analysis:    Component Value Date/Time   COLORURINE YELLOW 04/17/2023 1228   APPEARANCEUR CLEAR 04/17/2023 1228   APPEARANCEUR Clear 03/18/2023 1411   LABSPEC 1.013 04/17/2023 1228   PHURINE 6.0 04/17/2023 1228   GLUCOSEU NEGATIVE 04/17/2023 1228   HGBUR SMALL (A) 04/17/2023 1228   BILIRUBINUR NEGATIVE 04/17/2023 1228   BILIRUBINUR Negative 03/18/2023 1411   KETONESUR NEGATIVE 04/17/2023 1228   PROTEINUR NEGATIVE 04/17/2023 1228   UROBILINOGEN 0.2 05/02/2022 0916   UROBILINOGEN 0.2 04/21/2012 0936   NITRITE NEGATIVE 04/17/2023 1228   LEUKOCYTESUR TRACE (A) 04/17/2023 1228    Sepsis Labs: Lactic Acid, Venous    Component Value Date/Time   LATICACIDVEN 1.0 04/17/2023 1515    MICROBIOLOGY: Recent Results (from the past 240 hour(s))  Blood culture (routine x 2)     Status: None (Preliminary result)   Collection Time: 04/17/23  1:58 PM   Specimen: BLOOD RIGHT ARM  Result Value Ref Range Status   Specimen Description BLOOD RIGHT ARM  Final   Special Requests   Final    BOTTLES DRAWN AEROBIC AND ANAEROBIC Blood Culture adequate volume   Culture   Final    NO GROWTH 3 DAYS Performed at [facility], [address], [place], [place] [address]     Report Status PENDING  Incomplete  Blood culture (routine x 2)     Status: None (Preliminary result)   Collection Time: 04/17/23  8:14 PM   Specimen: BLOOD  Result Value Ref Range Status   Specimen Description BLOOD SITE NOT SPECIFIED  Final   Special Requests   Final    BOTTLES DRAWN AEROBIC AND ANAEROBIC Blood Culture results may not be optimal due to an inadequate volume of blood received in culture bottles   Culture   Final    NO GROWTH 3 DAYS Performed at [facility], [address], [place], [place] [address]    Report Status PENDING  Incomplete  Aerobic Culture w Gram Stain (superficial specimen)     Status: None (Preliminary result)   Collection Time: 04/18/23 12:10 AM   Specimen: Wound  Result Value Ref Range Status   Specimen Description WOUND  Final   Special Requests LEFT LEG  Final   Gram Stain   Final    FEW WBC PRESENT,BOTH PMN AND MONONUCLEAR NO ORGANISMS SEEN    Culture   Final    RARE PSEUDOMONAS AERUGINOSA CULTURE REINCUBATED FOR BETTER GROWTH Performed at [facility], [address], [place], [place] [address]    Report Status PENDING  Incomplete  Aerobic Culture w Gram Stain (superficial specimen)     Status: None (Preliminary result)   Collection Time: 04/18/23 12:40 AM   Specimen: Wound  Result Value Ref Range Status   Specimen Description WOUND  Final   Special Requests RIGHT LEG  Final   Gram Stain   Final    RARE WBC PRESENT, PREDOMINANTLY MONONUCLEAR NO ORGANISMS SEEN    Culture   Final    RARE PSEUDOMONAS AERUGINOSA SUSCEPTIBILITIES TO FOLLOW Performed at [facility], [address], [place], [place] [address]    Report Status PENDING  Incomplete    RADIOLOGY STUDIES/RESULTS: No results found.   LOS: 2 days   Jeoffrey Massed, MD  [facility]    To contact the attending provider between 7A-7P or the covering provider during after hours 7P-7A,  please log into the web site [email or web address] and access using  universal [place] password for that web site. If you do not have the password, please call the hospital operator.  04/20/2023, 10:32 AM

## 2023-04-21 ENCOUNTER — Ambulatory Visit: Payer: Medicare Other | Admitting: Family

## 2023-04-21 ENCOUNTER — Inpatient Hospital Stay (HOSPITAL_COMMUNITY): Payer: Medicare Other

## 2023-04-21 DIAGNOSIS — I480 Paroxysmal atrial fibrillation: Secondary | ICD-10-CM | POA: Diagnosis not present

## 2023-04-21 DIAGNOSIS — E7800 Pure hypercholesterolemia, unspecified: Secondary | ICD-10-CM | POA: Diagnosis not present

## 2023-04-21 DIAGNOSIS — Z8614 Personal history of Methicillin resistant Staphylococcus aureus infection: Secondary | ICD-10-CM | POA: Diagnosis not present

## 2023-04-21 DIAGNOSIS — M7989 Other specified soft tissue disorders: Secondary | ICD-10-CM | POA: Diagnosis not present

## 2023-04-21 DIAGNOSIS — L03114 Cellulitis of left upper limb: Secondary | ICD-10-CM

## 2023-04-21 DIAGNOSIS — A419 Sepsis, unspecified organism: Secondary | ICD-10-CM | POA: Diagnosis not present

## 2023-04-21 DIAGNOSIS — L03116 Cellulitis of left lower limb: Secondary | ICD-10-CM | POA: Diagnosis not present

## 2023-04-21 DIAGNOSIS — L03115 Cellulitis of right lower limb: Secondary | ICD-10-CM | POA: Diagnosis not present

## 2023-04-21 DIAGNOSIS — D72829 Elevated white blood cell count, unspecified: Secondary | ICD-10-CM

## 2023-04-21 LAB — AEROBIC CULTURE W GRAM STAIN (SUPERFICIAL SPECIMEN)

## 2023-04-21 MED ORDER — PREGABALIN 100 MG PO CAPS
100.0000 mg | ORAL_CAPSULE | Freq: Two times a day (BID) | ORAL | Status: DC
  Administered 2023-04-21 – 2023-04-23 (×4): 100 mg via ORAL
  Filled 2023-04-21 (×4): qty 1

## 2023-04-21 MED ORDER — LEVOFLOXACIN 750 MG PO TABS
750.0000 mg | ORAL_TABLET | Freq: Every day | ORAL | Status: DC
  Administered 2023-04-21 – 2023-04-22 (×2): 750 mg via ORAL
  Filled 2023-04-21 (×2): qty 1

## 2023-04-21 MED ORDER — LORATADINE 10 MG PO TABS
10.0000 mg | ORAL_TABLET | Freq: Every day | ORAL | Status: DC
  Administered 2023-04-21 – 2023-04-23 (×3): 10 mg via ORAL
  Filled 2023-04-21 (×3): qty 1

## 2023-04-21 NOTE — Plan of Care (Signed)
  Problem: Health Behavior/Discharge Planning: Goal: Ability to manage health-related needs will improve Outcome: Progressing   Problem: Clinical Measurements: Goal: Ability to maintain clinical measurements within normal limits will improve Outcome: Progressing   Problem: Nutrition: Goal: Adequate nutrition will be maintained Outcome: Progressing   Problem: Pain Management: Goal: General experience of comfort will improve Outcome: Progressing   Problem: Skin Integrity: Goal: Risk for impaired skin integrity will decrease Outcome: Progressing   Problem: Skin Integrity: Goal: Skin integrity will improve Outcome: Progressing

## 2023-04-21 NOTE — Progress Notes (Signed)
PROGRESS NOTE        PATIENT DETAILS Name: Levi [name] Age: 77 y.o. Sex: male Date of Birth: 09/27/1945 Admit Date: 04/17/2023 Admitting Physician Synetta Fail, MD [id number], [name], MD  Brief Summary: Patient is a 77 y.o.  male with recent history of MSSA bacteremia-completed a course of IV Ancef-and then p.o. cefadroxil x 10 days (completed 5 days prior to this hospitalization) presented with worsening leg wound/cellulitis following trauma (struck both leg against oven) to his bilateral legs.  Patient was found to have leukocytosis/cellulitis of bilateral lower extremities and subsequently admitted to the hospitalist service.  Significant events: 11/21>> admit to [facility]  Significant studies: None  Significant microbiology data: 11/21>> blood culture:no growth 11/22>> culture of swab right/left leg: Pseudomonas  Procedures: None  Consults: None  Subjective: Complains of neuropathic pain in both his legs-complains that compressive dressing is causing him to have more pain.  Dressing removed-swelling/redness appears essentially unchanged from yesterday.  Objective: Vitals: Blood pressure (!) 147/92, pulse 93, temperature 98.3 F (36.8 C), temperature source Oral, resp. rate 17, height 6\' 2"  (1.88 m), weight 87.5 kg, SpO2 97%.   Exam: Gen Exam:Alert awake-not in any distress HEENT:atraumatic, normocephalic Chest: B/L clear to auscultation anteriorly CVS:S1S2 regular Abdomen:soft non tender, non distended Extremities:essentially unchanged from yesterday-see pic below taken 11/24 Neurology: Non focal Skin: no rash     Pertinent Labs/Radiology:    Latest Ref Rng & Units 04/20/2023    3:43 AM 04/19/2023    4:25 AM 04/18/2023    4:16 AM  CBC  WBC 4.0 - 10.5 K/uL 7.8  11.4  13.4   Hemoglobin 13.0 - 17.0 g/dL 74.2  59.5  63.8   Hematocrit 39.0 - 52.0 % 32.4  33.1  32.4   Platelets 150 - 400 K/uL 149  138  142     Lab  Results  Component Value Date   NA 131 (L) 04/20/2023   K 3.8 04/20/2023   CL 98 04/20/2023   CO2 24 04/20/2023      Assessment/Plan: Sepsis secondary to soft tissue infection-cellulitis-of bilateral lower extremities superimposed on chronic venous stasis (POA) Overall improved but still with swelling/erythema Has been switched to fluoroquinolone Continue leg elevation/gentle compression dressings (apparently somewhat tight overnight-causes neuropathy pain to get worse) Extremities look essentially the same as 11/24-soft compartments-dorsalis pedis pulse intact. Had some dizziness when ambulated with physical therapy yesterday-hence diuretics held Patient apparently had an ID follow-up today-will get ID input today.-hence  Bilateral lower extremity wounds/chronic venous stasis Appreciate wound care input Elevate legs/gentle compressive dressing Since continues to have persistent LLE edema-will check Dopplers but patient already on Eliquis.  History of peripheral neuropathy Increase Lyrica Continue Cymbalta Supportive care  Recent history of MSSA bacteremia Completed IV Ancef-then oral cefadroxil Repeat blood cultures 11/21 negative so far  Hypokalemia Repleted.  Chronic combined systolic and diastolic CHF (EF 75-64% by TEE on 10/28) Compensated Not on diuretics at home-but currently on Lasix to help with leg edema from venous stasis/cellulitis.  Hypertension BP stable Continue Cardizem/Imdur   Hyperlipidemia atorvastatin   GERD  PPI   Paroxysmal atrial fibrillation Telemetry monitoring Cardizem/Eliquis   Peripheral neuropathy Lyrica/Cymbalta  Gout Not in exacerbation Allopurinol   History of superficial venous thrombosis On Eliquis for A-fib   COPD Asthma Not in exacerbation Bronchodilators Chronic prednisone   BPH tamsulosin  Lumbar stenosis Supportive care-follow-up with PCP  BMI: Estimated body mass index is 24.78 kg/m as calculated  from the following:   Height as of this encounter: 6\' 2"  (1.88 m).   Weight as of this encounter: 87.5 kg.   Code status:   Code Status: Full Code   DVT Prophylaxis: apixaban (ELIQUIS) tablet 5 mg     Family Communication: Spouse at bedside on 11/24   Disposition Plan: Status is: Observation The patient will require care spanning > 2 midnights and should be moved to inpatient because: Severity of illness   Planned Discharge Destination:Home   Diet: Diet Order             Diet regular Room service appropriate? Yes; Fluid consistency: Thin  Diet effective now                     Antimicrobial agents: Anti-infectives (From admission, onward)    Start     Dose/Rate Route Frequency Ordered Stop   04/21/23 1000  levofloxacin (LEVAQUIN) tablet 750 mg        750 mg Oral Daily 04/21/23 0914     04/20/23 1800  ciprofloxacin (CIPRO) tablet 500 mg  Status:  Discontinued        500 mg Oral 2 times daily 04/20/23 1630 04/21/23 0913   04/20/23 1615  levofloxacin (LEVAQUIN) tablet 750 mg  Status:  Discontinued        750 mg Oral Daily 04/20/23 1524 04/20/23 1630   04/18/23 1000  cefTRIAXone (ROCEPHIN) 2 g in sodium chloride 0.9 % 100 mL IVPB  Status:  Discontinued        2 g 200 mL/hr over 30 Minutes Intravenous Every 24 hours 04/17/23 1606 04/20/23 1525   04/17/23 1330  cefTRIAXone (ROCEPHIN) 1 g in sodium chloride 0.9 % 100 mL IVPB        1 g 200 mL/hr over 30 Minutes Intravenous  Once 04/17/23 1329 04/17/23 1444        MEDICATIONS: Scheduled Meds:  allopurinol  100 mg Oral q AM   apixaban  5 mg Oral BID   arformoterol  15 mcg Nebulization BID   atorvastatin  20 mg Oral Daily   diltiazem  180 mg Oral Daily   DULoxetine  30 mg Oral Daily   isosorbide mononitrate  30 mg Oral Daily   levofloxacin  750 mg Oral Daily   loratadine  10 mg Oral Daily   pantoprazole  40 mg Oral BID AC   pregabalin  100 mg Oral BID   sodium chloride flush  3 mL Intravenous Q12H    tamsulosin  0.4 mg Oral QHS   Continuous Infusions:   PRN Meds:.acetaminophen **OR** acetaminophen, albuterol, oxyCODONE, polyethylene glycol   I have personally reviewed following labs and imaging studies  LABORATORY DATA: CBC: Recent Labs  Lab 04/17/23 0909 04/17/23 1228 04/18/23 0416 04/19/23 0425 04/20/23 0343  WBC 21.0* 19.8* 13.4* 11.4* 7.8  NEUTROABS 17.6* 16.1*  --   --   --   HGB 12.5* 11.7* 10.4* 10.5* 10.3*  HCT 38.2 37.4* 32.4* 33.1* 32.4*  MCV 94 96.6 93.1 93.8 93.4  PLT 202 179 142* 138* 149*    Basic Metabolic Panel: Recent Labs  Lab 04/17/23 0909 04/17/23 1228 04/18/23 0416 04/19/23 0425 04/20/23 0343  NA 136 135 132* 132* 131*  K 5.1 3.5 3.4* 3.4* 3.8  CL 99 102 103 97* 98  CO2 25 24 23 23 24   GLUCOSE 105* 127* 84  89 98  BUN 16 15 14 14 13   CREATININE 0.91 0.94 0.88 0.79 0.87  CALCIUM 9.8 9.0 8.4* 8.4* 8.4*    GFR: Estimated Creatinine Clearance: 84 mL/min (by C-G formula based on SCr of 0.87 mg/dL).  Liver Function Tests: Recent Labs  Lab 04/17/23 0909 04/17/23 1228 04/18/23 0416  AST 20 21 16   ALT 14 17 14   ALKPHOS 116 83 69  BILITOT 0.7 1.1 1.1  PROT 5.9* 5.8* 5.0*  ALBUMIN 4.0 3.1* 2.6*   No results for input(s): "LIPASE", "AMYLASE" in the last 168 hours. No results for input(s): "AMMONIA" in the last 168 hours.  Coagulation Profile: No results for input(s): "INR", "PROTIME" in the last 168 hours.  Cardiac Enzymes: No results for input(s): "CKTOTAL", "CKMB", "CKMBINDEX", "TROPONINI" in the last 168 hours.  BNP (last 3 results) No results for input(s): "PROBNP" in the last 8760 hours.  Lipid Profile: No results for input(s): "CHOL", "HDL", "LDLCALC", "TRIG", "CHOLHDL", "LDLDIRECT" in the last 72 hours.  Thyroid Function Tests: No results for input(s): "TSH", "T4TOTAL", "FREET4", "T3FREE", "THYROIDAB" in the last 72 hours.  Anemia Panel: No results for input(s): "VITAMINB12", "FOLATE", "FERRITIN", "TIBC", "IRON",  "RETICCTPCT" in the last 72 hours.  Urine analysis:    Component Value Date/Time   COLORURINE YELLOW 04/17/2023 1228   APPEARANCEUR CLEAR 04/17/2023 1228   APPEARANCEUR Clear 03/18/2023 1411   LABSPEC 1.013 04/17/2023 1228   PHURINE 6.0 04/17/2023 1228   GLUCOSEU NEGATIVE 04/17/2023 1228   HGBUR SMALL (A) 04/17/2023 1228   BILIRUBINUR NEGATIVE 04/17/2023 1228   BILIRUBINUR Negative 03/18/2023 1411   KETONESUR NEGATIVE 04/17/2023 1228   PROTEINUR NEGATIVE 04/17/2023 1228   UROBILINOGEN 0.2 05/02/2022 0916   UROBILINOGEN 0.2 04/21/2012 0936   NITRITE NEGATIVE 04/17/2023 1228   LEUKOCYTESUR TRACE (A) 04/17/2023 1228    Sepsis Labs: Lactic Acid, Venous    Component Value Date/Time   LATICACIDVEN 1.0 04/17/2023 1515    MICROBIOLOGY: Recent Results (from the past 240 hour(s))  Blood culture (routine x 2)     Status: None (Preliminary result)   Collection Time: 04/17/23  1:58 PM   Specimen: BLOOD RIGHT ARM  Result Value Ref Range Status   Specimen Description BLOOD RIGHT ARM  Final   Special Requests   Final    BOTTLES DRAWN AEROBIC AND ANAEROBIC Blood Culture adequate volume   Culture   Final    NO GROWTH 4 DAYS Performed at [facility], [address] [place], [place] [address]    Report Status PENDING  Incomplete  Blood culture (routine x 2)     Status: None (Preliminary result)   Collection Time: 04/17/23  8:14 PM   Specimen: BLOOD  Result Value Ref Range Status   Specimen Description BLOOD SITE NOT SPECIFIED  Final   Special Requests   Final    BOTTLES DRAWN AEROBIC AND ANAEROBIC Blood Culture results may not be optimal due to an inadequate volume of blood received in culture bottles   Culture   Final    NO GROWTH 4 DAYS Performed at [facility], [address], [place], [place] [address]    Report Status PENDING  Incomplete  Aerobic Culture w Gram Stain (superficial specimen)     Status: None   Collection Time: 04/18/23 12:10 AM   Specimen: Wound   Result Value Ref Range Status   Specimen Description WOUND  Final   Special Requests LEFT LEG  Final   Gram Stain   Final  FEW WBC PRESENT,BOTH PMN AND MONONUCLEAR NO ORGANISMS SEEN Performed at [facility], [address], [place], [place] [address]    Culture   Final    RARE PSEUDOMONAS AERUGINOSA RARE STAPHYLOCOCCUS HAEMOLYTICUS SUSCEPTIBILITIES PERFORMED ON PREVIOUS CULTURE WITHIN THE LAST 5 DAYS. PSEUDOMONAS AERUGINOSA    Report Status 04/21/2023 FINAL  Final   Organism ID, Bacteria STAPHYLOCOCCUS HAEMOLYTICUS  Final      Susceptibility   Staphylococcus haemolyticus - MIC*    CIPROFLOXACIN <=0.5 SENSITIVE Sensitive     ERYTHROMYCIN >=8 RESISTANT Resistant     GENTAMICIN <=0.5 SENSITIVE Sensitive     OXACILLIN >=4 RESISTANT Resistant     TETRACYCLINE >=16 RESISTANT Resistant     VANCOMYCIN 1 SENSITIVE Sensitive     TRIMETH/SULFA <=10 SENSITIVE Sensitive     CLINDAMYCIN RESISTANT Resistant     RIFAMPIN <=0.5 SENSITIVE Sensitive     Inducible Clindamycin POSITIVE Resistant     * RARE STAPHYLOCOCCUS HAEMOLYTICUS  Aerobic Culture w Gram Stain (superficial specimen)     Status: None   Collection Time: 04/18/23 12:40 AM   Specimen: Wound  Result Value Ref Range Status   Specimen Description WOUND  Final   Special Requests RIGHT LEG  Final   Gram Stain   Final    RARE WBC PRESENT, PREDOMINANTLY MONONUCLEAR NO ORGANISMS SEEN Performed at [facility], [address], [place], [place] [address]    Culture RARE PSEUDOMONAS AERUGINOSA  Final   Report Status 04/21/2023 FINAL  Final   Organism ID, Bacteria PSEUDOMONAS AERUGINOSA  Final      Susceptibility   Pseudomonas aeruginosa - MIC*    CEFTAZIDIME 4 SENSITIVE Sensitive     CIPROFLOXACIN <=0.25 SENSITIVE Sensitive     GENTAMICIN <=1 SENSITIVE Sensitive     IMIPENEM 1 SENSITIVE Sensitive     PIP/TAZO 8 SENSITIVE Sensitive ug/mL    CEFEPIME 2 SENSITIVE Sensitive     * RARE PSEUDOMONAS AERUGINOSA     RADIOLOGY STUDIES/RESULTS: No results found.   LOS: 3 days   Jeoffrey Massed, MD  [facility]    To contact the attending provider between 7A-7P or the covering provider during after hours 7P-7A, please log into the web site [email or web address] and access using universal [place] password for that web site. If you do not have the password, please call the hospital operator.  04/21/2023, 11:24 AM

## 2023-04-21 NOTE — Care Management Important Message (Signed)
Important Message  Patient Details  Name: Levi [name] MRN: [id number] Date of Birth: 1945/08/29   Important Message Given:  Yes - Medicare IM     Dorena Bodo 04/21/2023, 3:39 PM

## 2023-04-21 NOTE — Progress Notes (Signed)
Mobility Specialist Progress Note;   04/21/23 0905  Mobility  Activity Refused mobility   Pt stating he is in too much pain today to walk. BLE elevated and displayed some redness and swelling. Will f/u as schedule permits.   Caesar Bookman Mobility Specialist Please contact via SecureChat or Rehab Office [phone]

## 2023-04-21 NOTE — Plan of Care (Signed)
  Problem: Education: Goal: Knowledge of General Education information will improve Description: Including pain rating scale, medication(s)/side effects and non-pharmacologic comfort measures Outcome: Progressing   Problem: Health Behavior/Discharge Planning: Goal: Ability to manage health-related needs will improve Outcome: Progressing   Problem: Activity: Goal: Risk for activity intolerance will decrease Outcome: Progressing   Problem: Nutrition: Goal: Adequate nutrition will be maintained Outcome: Progressing   Problem: Elimination: Goal: Will not experience complications related to bowel motility Outcome: Progressing Goal: Will not experience complications related to urinary retention Outcome: Progressing   Problem: Pain Management: Goal: General experience of comfort will improve Outcome: Progressing   Problem: Safety: Goal: Ability to remain free from injury will improve Outcome: Progressing   Problem: Skin Integrity: Goal: Risk for impaired skin integrity will decrease Outcome: Progressing   Problem: Clinical Measurements: Goal: Ability to avoid or minimize complications of infection will improve Outcome: Progressing   Problem: Skin Integrity: Goal: Skin integrity will improve Outcome: Progressing

## 2023-04-21 NOTE — Consult Note (Signed)
[facility] Liaison Consult Note    04/21/2023  Levi [name] 02/27/1946 [id number]   Insurance: Medicare ACO REACH   Primary Care Provider: Monica Becton, MD, with [facility], Kathryne Sharper, this provider is listed for the transition of care follow up appointments  and Community [facility] calls   [facility] Liaison met patient at bedside at [facility]. Spouse at the bedside. Introduced self and reason for rounding visit. They endorses PCP and has MyChart.    The patient was screened for 30 day readmission hospitalization with noted high risk score for unplanned readmission risk 2 hospital admissions in 6 months.  The patient was assessed for potential Community Care Coordination service needs for post hospital transition for care coordination. Review of patient's electronic medical record reveals patient is admitted with cellulitis and sepsis.   Plan: [facility] Liaison will continue to follow progress and disposition to asess for post hospital community care coordination/management needs.  Referral request for community care coordination: No needs expressed and anticipate needs address by the Community TOC follow up post hospital call.   [facility], Population Health does not replace or interfere with any arrangements made by the Inpatient Transition of Care team.   For questions contact:   Charlesetta Shanks, RN, BSN, CCM [place]  [facility], Population Health, [facility] Liaison Direct Dial: [phone] or secure chat Email: [email or web address]

## 2023-04-21 NOTE — Consult Note (Signed)
[facility]    Date of Admission:  04/17/2023   Total days of inpatient antibiotics 4        Reason for Consult: Cellulitis    Active Problems:   Asthma, moderate persistent   Paroxysmal atrial fibrillation (HCC)   Hyperlipidemia   Lumbar stenosis status post T12-S1 fusion   GERD (gastroesophageal reflux disease) status post Nissen fundoplication   COPD (chronic obstructive pulmonary disease) (HCC)   Peripheral neuropathy   Chronic systolic heart failure (HCC)   Essential (primary) hypertension   BPH (benign prostatic hyperplasia)   Cellulitis   Assessment: 77 year old male with history of MSSA bacteremia secondary to chronic right shin wound that he incurred after a fall completed antibiotics with cefazolin EOT 04/01/2023, A-fib on Eliquis, history of lumbar fusion admitted with: #Bilateral lower extremity wounds #Leukocytosis secondary to #1 #COPD on prednisone 10 mg twice daily - Patient initially sedated with ceftriaxone.  In the ED bilateral wounds were swabbed.  They are growing Pseudomonas on both legs and Pseudomonas and Staphylococcus hemolyticus on the left leg.  ID engaged as his leukocytosis trended down on ceftriaxone.  Recommendations:  -Discontinue Levaquin - Complete antibiotic course with cipro(psA) +  linezolid(antitoxin) twice daily x 10days given immunosuppression - Start Bactrim for PJP prophylaxis - ID will sign off. Microbiology:   Antibiotics: Ceftriaxone 11/20 on-24 Ciprofloxacin 11/24-11/25 Levaquin 11/25   Cultures: Blood 11/21 no growth Urine  Other   HPI: Levi [name] is a 77 y.o. male history of MSSA bacteremia suspected secondary to chronic right shin wound treated with cefazolin x 2 weeks EOT 04/01/2023, paroxysmal A-fib on Eliquis, COPD on chronic prednisone suppression 10 mg twice daily, hypertension lumbar fusion scented with cellulitis.  Patient has a history of venous sufficiency emergent  status.  On arrival to the ED patient had a WBC count of 19K.  Wound was swabbed.  Patient started on ceftriaxone and leukocytosis resolved.  ID engaged as pansensitive Pseudomonas on the right leg and Pseudomonas and staph hemolyticus on the left leg.  ID engaged for antibiotic recommendations.   Review of Systems: Review of Systems  All other systems reviewed and are negative.   Past Medical History:  Diagnosis Date   Agent orange exposure 1970's   takes Imdur,Diltiazem, and Atacand daily   ALLERGIC RHINITIS    Allergy    seasonal allergies   Asthma    uses inhaler and nebulizers   Bruises easily    d/t prednisone daily   Cataract    Cellulitis 03/22/2020   Chest wall pain 09/21/2019   L scapula, reported 4/27 21/     CHF (congestive heart failure) (HCC)    dx-on meds to treat   Chronic bronchitis (HCC)    "used to get it q yr; last time ?2008" (04/28/2012)   Clotting disorder (HCC) 2019   On blood thinner when I bleed it is for a consideral amount of time. This is tegardless of size of wound.   COPD (chronic obstructive pulmonary disease) (HCC)    agent orange exposure   Degenerative disk disease    "everywhere" (04/28/2012)   Diverticulitis    Dysrhythmia    afib   Elevated uric acid in blood    takes Allopurinol daily   Emphysema of lung (HCC)    Enlarged prostate    but not on any meds   GERD (gastroesophageal reflux disease)    takes Protonix daily   H/O  hiatal hernia    Headache    History of pneumonia    a. 2010   Hyperlipidemia    takes Lipitor daily; pt. states he takes a preventive   Hypothyroidism    Joint pain    Joint swelling    MSSA bacteremia 03/19/2023   Nausea and vomiting 11/12/2022   Neuromuscular disorder (HCC)    bilat legs, and bilat arms   Osteoporosis 2012   PAF (paroxysmal atrial fibrillation) (HCC)    a. Dx 04/2012, CHA2DS2VASc = 1 (age);  b. 04/2012 Echo: EF 40-50%, mild MR.   Personal history of colonic adenomas 02/09/2013    Pneumonia    Sepsis (HCC) 03/18/2023   Shortness of breath dyspnea    Thyroid disease    no taking meds-caused dizziness    Social History   Tobacco Use   Smoking status: Never   Smokeless tobacco: Never  Vaping Use   Vaping status: Never Used  Substance Use Topics   Alcohol use: No   Drug use: No    Family History  Problem Relation Age of Onset   Diabetes Mother    Heart disease Father        Died 36, MI   Cancer Father    Hyperlipidemia Father    Stroke Father    Prostate cancer Brother    Colon polyps Brother    Colon polyps Brother    Asthma Brother    COPD Brother    Colon cancer Brother        Dx age 33   Cancer Brother    Colon cancer Other    Esophageal cancer Neg Hx    Rectal cancer Neg Hx    Stomach cancer Neg Hx    Pancreatic cancer Neg Hx    Kidney disease Neg Hx    Liver disease Neg Hx    Neuropathy Neg Hx    Scheduled Meds:  allopurinol  100 mg Oral q AM   apixaban  5 mg Oral BID   arformoterol  15 mcg Nebulization BID   atorvastatin  20 mg Oral Daily   diltiazem  180 mg Oral Daily   DULoxetine  30 mg Oral Daily   isosorbide mononitrate  30 mg Oral Daily   levofloxacin  750 mg Oral Daily   loratadine  10 mg Oral Daily   pantoprazole  40 mg Oral BID AC   pregabalin  100 mg Oral BID   sodium chloride flush  3 mL Intravenous Q12H   tamsulosin  0.4 mg Oral QHS   Continuous Infusions: PRN Meds:.acetaminophen **OR** acetaminophen, albuterol, oxyCODONE, polyethylene glycol Allergies  Allergen Reactions   Levaquin [Levofloxacin] Itching    Tolerated ciprofloxacin in 2022.    OBJECTIVE: Blood pressure (!) 147/92, pulse 93, temperature 98.3 F (36.8 C), temperature source Oral, resp. rate 17, height 6\' 2"  (1.88 m), weight 87.5 kg, SpO2 97%.  Physical Exam Constitutional:      General: He is not in acute distress.    Appearance: He is normal weight. He is not toxic-appearing.  HENT:     Head: Normocephalic and atraumatic.     Right Ear:  External ear normal.     Left Ear: External ear normal.     Nose: No congestion or rhinorrhea.     Mouth/Throat:     Mouth: Mucous membranes are moist.     Pharynx: Oropharynx is clear.  Eyes:     Extraocular Movements: Extraocular movements intact.     Conjunctiva/sclera:  Conjunctivae normal.     Pupils: Pupils are equal, round, and reactive to light.  Cardiovascular:     Rate and Rhythm: Normal rate and regular rhythm.     Heart sounds: No murmur heard.    No friction rub. No gallop.  Pulmonary:     Effort: Pulmonary effort is normal.     Breath sounds: Normal breath sounds.  Abdominal:     General: Abdomen is flat. Bowel sounds are normal.     Palpations: Abdomen is soft.  Musculoskeletal:        General: No swelling. Normal range of motion.     Cervical back: Normal range of motion and neck supple.  Skin:    General: Skin is warm and dry.  Neurological:     General: No focal deficit present.     Mental Status: He is oriented to person, place, and time.  Psychiatric:        Mood and Affect: Mood normal.     Lab Results Lab Results  Component Value Date   WBC 7.8 04/20/2023   HGB 10.3 (L) 04/20/2023   HCT 32.4 (L) 04/20/2023   MCV 93.4 04/20/2023   PLT 149 (L) 04/20/2023    Lab Results  Component Value Date   CREATININE 0.87 04/20/2023   BUN 13 04/20/2023   NA 131 (L) 04/20/2023   K 3.8 04/20/2023   CL 98 04/20/2023   CO2 24 04/20/2023    Lab Results  Component Value Date   ALT 14 04/18/2023   AST 16 04/18/2023   ALKPHOS 69 04/18/2023   BILITOT 1.1 04/18/2023       Danelle Earthly, MD [facility] 04/21/2023, 2:52 PM I have personally spent 82 minutes involved in face-to-face and non-face-to-face activities for this patient on the day of the visit. Professional time spent includes the following activities: Preparing to see the patient (review of tests), Obtaining and/or reviewing separately obtained  history (admission/discharge record), Performing a medically appropriate examination and/or evaluation , Ordering medications/tests/procedures, referring and communicating with other health care professionals, Documenting clinical information in the EMR, Independently interpreting results (not separately reported), Communicating results to the patient/family/caregiver, Counseling and educating the patient/family/caregiver and Care coordination (not separately reported).  e

## 2023-04-22 ENCOUNTER — Other Ambulatory Visit (HOSPITAL_COMMUNITY): Payer: Self-pay

## 2023-04-22 DIAGNOSIS — L03116 Cellulitis of left lower limb: Secondary | ICD-10-CM | POA: Diagnosis not present

## 2023-04-22 DIAGNOSIS — J438 Other emphysema: Secondary | ICD-10-CM | POA: Diagnosis not present

## 2023-04-22 DIAGNOSIS — A419 Sepsis, unspecified organism: Secondary | ICD-10-CM | POA: Diagnosis not present

## 2023-04-22 DIAGNOSIS — I5022 Chronic systolic (congestive) heart failure: Secondary | ICD-10-CM | POA: Diagnosis not present

## 2023-04-22 LAB — CBC
HCT: 32.8 % — ABNORMAL LOW (ref 39.0–52.0)
Hemoglobin: 10.7 g/dL — ABNORMAL LOW (ref 13.0–17.0)
MCH: 30.4 pg (ref 26.0–34.0)
MCHC: 32.6 g/dL (ref 30.0–36.0)
MCV: 93.2 fL (ref 80.0–100.0)
Platelets: 179 10*3/uL (ref 150–400)
RBC: 3.52 MIL/uL — ABNORMAL LOW (ref 4.22–5.81)
RDW: 16.4 % — ABNORMAL HIGH (ref 11.5–15.5)
WBC: 6.8 10*3/uL (ref 4.0–10.5)
nRBC: 0 % (ref 0.0–0.2)

## 2023-04-22 LAB — CULTURE, BLOOD (ROUTINE X 2)
Culture: NO GROWTH
Culture: NO GROWTH
Special Requests: ADEQUATE

## 2023-04-22 LAB — BASIC METABOLIC PANEL
Anion gap: 10 (ref 5–15)
BUN: 12 mg/dL (ref 8–23)
CO2: 26 mmol/L (ref 22–32)
Calcium: 8.6 mg/dL — ABNORMAL LOW (ref 8.9–10.3)
Chloride: 95 mmol/L — ABNORMAL LOW (ref 98–111)
Creatinine, Ser: 0.74 mg/dL (ref 0.61–1.24)
GFR, Estimated: >60 mL/min (ref >60)
Glucose, Bld: 108 mg/dL — ABNORMAL HIGH (ref 70–99)
Potassium: 4.4 mmol/L (ref 3.5–5.1)
Sodium: 131 mmol/L — ABNORMAL LOW (ref 135–145)

## 2023-04-22 MED ORDER — SULFAMETHOXAZOLE-TRIMETHOPRIM 400-80 MG PO TABS
1.0000 | ORAL_TABLET | Freq: Every day | ORAL | 0 refills | Status: DC
  Filled 2023-04-22: qty 30, 30d supply, fill #0

## 2023-04-22 MED ORDER — PREGABALIN 100 MG PO CAPS
100.0000 mg | ORAL_CAPSULE | Freq: Two times a day (BID) | ORAL | 2 refills | Status: DC
  Filled 2023-04-22: qty 60, 30d supply, fill #0

## 2023-04-22 MED ORDER — SULFAMETHOXAZOLE-TRIMETHOPRIM 400-80 MG PO TABS
1.0000 | ORAL_TABLET | Freq: Every day | ORAL | 0 refills | Status: DC
  Filled 2023-04-22: qty 14, 14d supply, fill #0

## 2023-04-22 MED ORDER — LINEZOLID 600 MG PO TABS
600.0000 mg | ORAL_TABLET | Freq: Two times a day (BID) | ORAL | 0 refills | Status: DC
  Filled 2023-04-22: qty 6, 3d supply, fill #0

## 2023-04-22 MED ORDER — CIPROFLOXACIN HCL 750 MG PO TABS
750.0000 mg | ORAL_TABLET | Freq: Two times a day (BID) | ORAL | 0 refills | Status: DC
  Filled 2023-04-22: qty 18, 9d supply, fill #0

## 2023-04-22 MED ORDER — SULFAMETHOXAZOLE-TRIMETHOPRIM 400-80 MG PO TABS: 1.0000 | ORAL_TABLET | Freq: Every day | ORAL | Status: DC

## 2023-04-22 MED ORDER — PREDNISONE 5 MG PO TABS: 10.0000 mg | ORAL_TABLET | Freq: Two times a day (BID) | ORAL | Status: DC

## 2023-04-22 MED ORDER — SODIUM CHLORIDE 0.9 % IV SOLN: INTRAVENOUS | Status: AC

## 2023-04-22 MED ORDER — SULFAMETHOXAZOLE-TRIMETHOPRIM 800-160 MG PO TABS
1.0000 | ORAL_TABLET | Freq: Two times a day (BID) | ORAL | 0 refills | Status: DC
  Filled 2023-04-22: qty 14, 7d supply, fill #0

## 2023-04-22 MED ORDER — LINEZOLID 600 MG PO TABS
600.0000 mg | ORAL_TABLET | Freq: Two times a day (BID) | ORAL | 0 refills | Status: DC
  Filled 2023-04-22: qty 20, 10d supply, fill #0

## 2023-04-22 MED ORDER — CIPROFLOXACIN HCL 750 MG PO TABS
750.0000 mg | ORAL_TABLET | Freq: Two times a day (BID) | ORAL | Status: DC
  Administered 2023-04-23: 750 mg via ORAL
  Filled 2023-04-22 (×2): qty 1

## 2023-04-22 MED ORDER — SULFAMETHOXAZOLE-TRIMETHOPRIM 800-160 MG PO TABS
1.0000 | ORAL_TABLET | Freq: Two times a day (BID) | ORAL | 0 refills | Status: AC
  Filled 2023-04-22: qty 14, 7d supply, fill #0

## 2023-04-22 MED ORDER — PREDNISONE 5 MG PO TABS
10.0000 mg | ORAL_TABLET | Freq: Two times a day (BID) | ORAL | Status: DC
  Administered 2023-04-23: 10 mg via ORAL
  Filled 2023-04-22: qty 2

## 2023-04-22 MED ORDER — SULFAMETHOXAZOLE-TRIMETHOPRIM 400-80 MG PO TABS
1.0000 | ORAL_TABLET | Freq: Every day | ORAL | Status: DC
  Administered 2023-04-22 – 2023-04-23 (×2): 1 via ORAL
  Filled 2023-04-22 (×2): qty 1

## 2023-04-22 MED ORDER — HYDROCORTISONE SOD SUC (PF) 100 MG IJ SOLR
50.0000 mg | Freq: Three times a day (TID) | INTRAMUSCULAR | Status: AC
  Administered 2023-04-22 (×2): 50 mg via INTRAVENOUS
  Filled 2023-04-22 (×2): qty 2

## 2023-04-22 MED ORDER — SULFAMETHOXAZOLE-TRIMETHOPRIM 400-80 MG PO TABS
1.0000 | ORAL_TABLET | Freq: Every day | ORAL | 1 refills | Status: DC
  Filled 2023-04-22: qty 30, 30d supply, fill #0

## 2023-04-22 MED ORDER — SULFAMETHOXAZOLE-TRIMETHOPRIM 800-160 MG PO TABS: 1.0000 | ORAL_TABLET | Freq: Two times a day (BID) | ORAL | Status: DC

## 2023-04-22 MED ORDER — LINEZOLID 600 MG PO TABS
600.0000 mg | ORAL_TABLET | Freq: Two times a day (BID) | ORAL | Status: DC
  Administered 2023-04-22 – 2023-04-23 (×3): 600 mg via ORAL
  Filled 2023-04-22 (×3): qty 1

## 2023-04-22 MED ORDER — SULFAMETHOXAZOLE-TRIMETHOPRIM 400-80 MG PO TABS
1.0000 | ORAL_TABLET | Freq: Every day | ORAL | 2 refills | Status: DC
  Filled 2023-04-22: qty 30, 30d supply, fill #0

## 2023-04-22 NOTE — Evaluation (Signed)
Occupational Therapy Evaluation Patient Details Name: Levi [name] MRN: [id number] DOB: 09-10-1945 Today's Date: 04/22/2023   History of Present Illness Levi [name] is a 77 yo male who presented 11/21 for follow-up of right lower leg laceration. Admitted for Sepsis secondary to soft tissue infection-cellulitis-of bilateral lower extremities superimposed on chronic venous stasis. PMHx:  CHF, COPD, HLD and venous insufficiency of both lower extremities, and PAF.   Clinical Impression   Levi [name] was evaluated s/p the above admission list. He is relatively mod I at baseline, although he has had several recent admissions. Upon evaluation the pt was limited by dizziness, orthostatic blood pressure, BLE wounds, limited activity tolerance and general weakness. Overall he needed min A for bed mobility with good sitting balance. No nystagmus noted with positional changes, however pt did have difficulty maintaining his eyes open. Due to the deficits listed below the pt also needs up to mod A for LB ADLs due to dizziness. Pt will benefit from continued acute OT services and d/c home with support of family.  Orthostatic BPs Supine 103/69    MAP(79)  Sitting 73/50    MAP (59)  Sitting after 3 min 79/64    MAP (70)  Standing *unable to attempt*          If plan is discharge home, recommend the following: A little help with walking and/or transfers;A little help with bathing/dressing/bathroom;Assistance with cooking/housework;Direct supervision/assist for medications management;Direct supervision/assist for financial management;Assist for transportation;Help with stairs or ramp for entrance    Functional Status Assessment  Patient has had a recent decline in their functional status and demonstrates the ability to make significant improvements in function in a reasonable and predictable amount of time.  Equipment Recommendations  None recommended by OT       Precautions / Restrictions  Precautions Precautions: Fall Precaution Comments: orthostatic Restrictions Weight Bearing Restrictions: No      Mobility Bed Mobility Overal bed mobility: Needs Assistance Bed Mobility: Supine to Sit, Sit to Supine     Supine to sit: Min assist Sit to supine: Min assist        Transfers                   General transfer comment: deferred      Balance Overall balance assessment: Needs assistance Sitting-balance support: Feet supported Sitting balance-[name] Scale: Fair         ADL either performed or assessed with clinical judgement   ADL Overall ADL's : Needs assistance/impaired Eating/Feeding: Independent   Grooming: Set up;Sitting   Upper Body Bathing: Set up;Sitting   Lower Body Bathing: Moderate assistance;Sit to/from stand   Upper Body Dressing : Set up;Sitting   Lower Body Dressing: Moderate assistance;Sit to/from stand   Toilet Transfer: Minimal assistance   Toileting- Clothing Manipulation and Hygiene: Contact guard assist;Sitting/lateral lean       Functional mobility during ADLs: Minimal assistance General ADL Comments: limited by symptomatic orthostatic hypotension     Vision Baseline Vision/History: 0 No visual deficits Vision Assessment?: Vision impaired- to be further tested in functional context Additional Comments: needs further assessment, pt had a hard time maintaining eyes open this date. No nystagmus noted when eyes were open     Perception Perception: Within Functional Limits       Praxis Praxis: [facility]       Pertinent Vitals/Pain Pain Assessment Pain Assessment: Faces Faces Pain Scale: No hurt Pain Location: jsut dizzy Pain Descriptors / Indicators: Discomfort Pain Intervention(s): Monitored during  session, Limited activity within patient's tolerance     Extremity/Trunk Assessment Upper Extremity Assessment Upper Extremity Assessment: Generalized weakness   Lower Extremity Assessment Lower Extremity  Assessment: Defer to PT evaluation   Cervical / Trunk Assessment Cervical / Trunk Assessment: Kyphotic   Communication Communication Communication: No apparent difficulties   Cognition Arousal: Alert Behavior During Therapy: Restless, WFL for tasks assessed/performed Overall Cognitive Status: Within Functional Limits for tasks assessed                                 General Comments: followed all simple commands     General Comments  Orthostatic BP, pt dizzy            Home Living Family/patient expects to be discharged to:: Private residence Living Arrangements: Spouse/significant other Available Help at Discharge: Family;Available 24 hours/day Type of Home: House Home Access: Stairs to enter [employer] of Steps: 3 Entrance Stairs-Rails: Left Home Layout: Laundry or work area in basement;One level;Able to live on main level with bedroom/bathroom     Bathroom Shower/Tub: Producer, television/film/video: Handicapped height     Home Equipment: Cane - single point          Prior Functioning/Environment Prior Level of Function : Independent/Modified Independent                        OT Problem List: Decreased strength;Decreased range of motion;Decreased activity tolerance;Impaired balance (sitting and/or standing);Decreased knowledge of use of DME or AE;Decreased knowledge of precautions;Decreased safety awareness      OT Treatment/Interventions: Self-care/ADL training;Therapeutic exercise;DME and/or AE instruction;Therapeutic activities;Balance training;Patient/family education    OT Goals(Current goals can be found in the care plan section) Acute Rehab OT Goals Patient Stated Goal: to feel better OT Goal Formulation: With patient Time For Goal Achievement: 05/06/23 Potential to Achieve Goals: Good ADL Goals Pt Will Perform Lower Body Dressing: sit to/from stand;with supervision Pt Will Transfer to Toilet: with  supervision;ambulating Additional ADL Goal #1: Pt will tolerate at least 8 minutes OOB activity to demonstrate improved endurance for ADLs  OT Frequency: Min 1X/week       AM-PAC OT "6 Clicks" Daily Activity     Outcome Measure Help from another person eating meals?: None Help from another person taking care of personal grooming?: A Little Help from another person toileting, which includes using toliet, bedpan, or urinal?: A Little Help from another person bathing (including washing, rinsing, drying)?: A Lot Help from another person to put on and taking off regular upper body clothing?: A Little Help from another person to put on and taking off regular lower body clothing?: A Lot 6 Click Score: 17   End of Session Nurse Communication: Mobility status (pt's LE needs to be re-wrapped. Current wrap is causing edema in foot)  Activity Tolerance: Patient tolerated treatment well Patient left: in bed;with call bell/phone within reach;with bed alarm set;with family/visitor present  OT Visit Diagnosis: Unsteadiness on feet (R26.81);Other abnormalities of gait and mobility (R26.89);Muscle weakness (generalized) (M62.81);Dizziness and giddiness (R42)                Time: 1411-1430 OT Time Calculation (min): 19 min Charges:  OT General Charges $OT Visit: 1 Visit OT Evaluation $OT Eval Moderate Complexity: 1 Mod  Derenda Mis, OTR/L Acute Rehabilitation Services Office [phone] Secure Chat Communication Preferred   Donia Pounds 04/22/2023, 2:55 PM

## 2023-04-22 NOTE — Discharge Summary (Signed)
PATIENT DETAILS Name: Levi [name] Age: 77 y.o. Sex: male Date of Birth: 21-Sep-1945 MRN: [id number]. Admitting Physician: Synetta Fail, MD YQM:[id number], [name], MD  Admit Date: 04/17/2023 Discharge date: 04/23/2023  Recommendations for Outpatient Follow-up:  Follow up with PCP in 1-2 weeks Please obtain CMP/CBC in one week  Admitted From:  Home  Disposition: Home health   Discharge Condition: good  CODE STATUS:   Code Status: Full Code   Diet recommendation:  Diet Order             Diet - low sodium heart healthy           Diet regular Room service appropriate? Yes; Fluid consistency: Thin  Diet effective now                    Brief Summary: Patient is a 77 y.o.  male with recent history of MSSA bacteremia-completed a course of IV Ancef-and then p.o. cefadroxil x 10 days (completed 5 days prior to this hospitalization) presented with worsening leg wound/cellulitis following trauma (struck both leg against oven) to his bilateral legs.  Patient was found to have leukocytosis/cellulitis of bilateral lower extremities and subsequently admitted to the hospitalist service.   Significant events: 11/21>> admit to [facility]   Significant studies: 11/25>>Bilateral lower extremity Dopplers: Negative for DVT   Significant microbiology data: 11/21>> blood culture:no growth 11/22>> culture of swab right/left leg: Pseudomonas   Procedures: None   Consults: ID  Brief Hospital Course: Sepsis secondary to soft tissue infection-cellulitis-of bilateral lower extremities superimposed on chronic venous stasis (POA) Overall improved-leukocytosis has resolved  Improvement in swelling and erythema but still persistent Initially treated with Rocephin-wound swab done in the ED positive for Pseudomonas Evaluated by infectious disease on 11/25-recommendations are to transition to Zyvox for a few days then Bactrim-subsequently be on prophylactic Bactrim given that he is  on steroids.  He will also be on ciprofloxacin for 10 days. Continue leg elevation is much as possible Continue compression dressings as much as possible.  Home health will be arranged.     Bilateral lower extremity wounds/chronic venous stasis Appreciate wound care input Elevate legs/gentle compressive dressing Dopplers negative for DVT   History of peripheral neuropathy Some pain continues Lyrica dosage increased-seems to be tolerating well Continue Cymbalta Supportive care   Recent history of MSSA bacteremia Completed IV Ancef-then oral cefadroxil Repeat blood cultures 11/21 negative so far   Hypokalemia Repleted.   Chronic combined systolic and diastolic CHF (EF 41-32% by TEE on 10/28) Compensated Not on diuretics at home-but currently on Lasix to help with leg edema from venous stasis/cellulitis.   Hypertension BP stable Continue Cardizem/Imdur   Hyperlipidemia atorvastatin   GERD  PPI   Paroxysmal atrial fibrillation Telemetry monitoring Cardizem/Eliquis   Peripheral neuropathy Lyrica/Cymbalta  Orthostatic hypotension Occurred on 11/26-symptomatic Managed with stress dose steroids/gentle IV fluids Suspect this was due to diuretics that he received-he is no longer on diuretics Much better on 11/27 asymptomatic-ambulating without any major issues Stable for discharge.   Gout Not in exacerbation Allopurinol   History of superficial venous thrombosis On Eliquis for A-fib   COPD Asthma Not in exacerbation Bronchodilators Chronic prednisone Has been started on Bactrim per ID recommendations for PJP prophylaxis as patient on chronic prednisone.   BPH tamsulosin   Lumbar stenosis Supportive care-follow-up with PCP   BMI: Estimated body mass index is 24.78 kg/m as calculated from the following:   Height as of this encounter: 6\' 2"  (  1.88 m).   Weight as of this encounter: 87.5 kg.    Discharge Diagnoses:  Active Problems:   Asthma, moderate  persistent   Paroxysmal atrial fibrillation (HCC)   Hyperlipidemia   Lumbar stenosis status post T12-S1 fusion   GERD (gastroesophageal reflux disease) status post Nissen fundoplication   COPD (chronic obstructive pulmonary disease) (HCC)   Peripheral neuropathy   Chronic systolic heart failure (HCC)   Essential (primary) hypertension   BPH (benign prostatic hyperplasia)   Cellulitis   Discharge Instructions:  Activity:  As tolerated with Full fall precautions use walker/cane & assistance as needed  Discharge Instructions     Call MD for:  difficulty breathing, headache or visual disturbances   Complete by: As directed    Call MD for:  extreme fatigue   Complete by: As directed    Call MD for:  persistant dizziness or light-headedness   Complete by: As directed    Diet - low sodium heart healthy   Complete by: As directed    Discharge instructions   Complete by: As directed    Follow with Primary MD  Levi Becton, MD in 1-2 weeks  Please get a complete blood count and chemistry panel checked by your Primary MD at your next visit, and again as instructed by your Primary MD.  Get Medicines reviewed and adjusted: Please take all your medications with you for your next visit with your Primary MD  Laboratory/radiological data: Please request your Primary MD to go over all hospital tests and procedure/radiological results at the follow up, please ask your Primary MD to get all Hospital records sent to his/her office.  In some cases, they will be blood work, cultures and biopsy results pending at the time of your discharge. Please request that your primary care M.D. follows up on these results.  Also Note the following: If you experience worsening of your admission symptoms, develop shortness of breath, life threatening emergency, suicidal or homicidal thoughts you must seek medical attention immediately by calling 911 or calling your MD immediately  if symptoms less  severe.  You must read complete instructions/literature along with all the possible adverse reactions/side effects for all the Medicines you take and that have been prescribed to you. Take any new Medicines after you have completely understood and accpet all the possible adverse reactions/side effects.   Do not drive when taking Pain medications or sleeping medications (Benzodaizepines)  Do not take more than prescribed Pain, Sleep and Anxiety Medications. It is not advisable to combine anxiety,sleep and pain medications without talking with your primary care practitioner  Special Instructions: If you have smoked or chewed Tobacco  in the last 2 yrs please stop smoking, stop any regular Alcohol  and or any Recreational drug use.  Wear Seat belts while driving.  Please note: You were cared for by a hospitalist during your hospital stay. Once you are discharged, your primary care physician will handle any further medical issues. Please note that NO REFILLS for any discharge medications will be authorized once you are discharged, as it is imperative that you return to your primary care physician (or establish a relationship with a primary care physician if you do not have one) for your post hospital discharge needs so that they can reassess your need for medications and monitor your lab values.   Discharge wound care:   Complete by: As directed    Wound care  Daily      Comments: Clean wounds to B  lower legs with NS, apply Xeroform gauze [place] [phone]) to wound beds daily, cover with ABD pads.  Wrap bilateral legs with Kerlix roll gauze beginning just above toes and ending right below knees.  Secure with Ace bandage in same fashion as Kerlix for light compression. Patient should keep legs elevated as much as possible   Increase activity slowly   Complete by: As directed       Allergies as of 04/23/2023       Reactions   Levaquin [levofloxacin] Itching   Tolerated ciprofloxacin in 2022.         Medication List     STOP taking these medications    doxycycline 100 MG capsule Commonly known as: VIBRAMYCIN       TAKE these medications    albuterol 108 (90 Base) MCG/ACT inhaler Commonly known as: VENTOLIN HFA INHALE 2 PUFFS BY MOUTH EVERY 4 HOURS AS NEEDED FOR WHEEZE OR FOR SHORTNESS OF BREATH   allopurinol 100 MG tablet Commonly known as: ZYLOPRIM Take 1 tablet (100 mg total) by mouth in the morning.   atorvastatin 20 MG tablet Commonly known as: LIPITOR TAKE 1 TABLET BY MOUTH EVERY DAY   ciprofloxacin 750 MG tablet Commonly known as: CIPRO Take 1 tablet (750 mg total) by mouth 2 (two) times daily.   Compressor/Nebulizer Misc Use as directed   cyclobenzaprine 10 MG tablet Commonly known as: FLEXERIL Take 1 tablet (10 mg total) by mouth at bedtime.   diltiazem 180 MG 24 hr capsule Commonly known as: CARDIZEM CD TAKE 1 CAPSULE BY MOUTH EVERY DAY   diltiazem 30 MG tablet Commonly known as: CARDIZEM TAKE 1 TABLET (30 MG TOTAL) BY MOUTH AS NEEDED (HEART RATE OVER 120).   DULoxetine 30 MG capsule Commonly known as: CYMBALTA Take 1 capsule (30 mg total) by mouth daily.   Eliquis 5 MG Tabs tablet Generic drug: apixaban TAKE 1 TABLET BY MOUTH TWICE A DAY   ipratropium-albuterol 0.5-2.5 (3) MG/3ML Soln Commonly known as: DUONEB 1 NEBULE EVERY 6 HOURS AS NEEDED. **J45.3** What changed:  how much to take how to take this when to take this reasons to take this additional instructions   isosorbide mononitrate 30 MG 24 hr tablet Commonly known as: IMDUR TAKE 1 TABLET BY MOUTH EVERY DAY   leptospermum manuka honey Pste paste Apply 1 Application topically daily.   linezolid 600 MG tablet Commonly known as: ZYVOX Take 1 tablet (600 mg total) by mouth every 12 (twelve) hours.   ondansetron 8 MG disintegrating tablet Commonly known as: ZOFRAN-ODT Take 1 tablet (8 mg total) by mouth every 8 (eight) hours as needed for nausea.    oxyCODONE-acetaminophen 10-325 MG tablet Commonly known as: Percocet Take 1 tablet by mouth every 8 (eight) hours as needed for pain.   pantoprazole 40 MG tablet Commonly known as: PROTONIX TAKE 1 TABLET (40 MG TOTAL) BY MOUTH TWICE A DAY BEFORE MEALS What changed: See the new instructions.   predniSONE 10 MG tablet Commonly known as: DELTASONE Take 1 tablet (10 mg total) by mouth in the morning and at bedtime.   pregabalin 100 MG capsule Commonly known as: Lyrica Take 1 capsule (100 mg total) by mouth 2 (two) times daily. What changed:  medication strength how much to take when to take this   Striverdi Respimat 2.5 MCG/ACT Aers Generic drug: Olodaterol HCl INHALE 2 PUFFS BY MOUTH INTO THE LUNGS DAILY What changed: See the new instructions.   sulfamethoxazole-trimethoprim 800-160 MG tablet Commonly known as:  BACTRIM DS Take 1 tablet by mouth 2 (two) times daily for 7 days starting 04/25/23 Start taking on: April 25, 2023   sulfamethoxazole-trimethoprim 400-80 MG tablet Commonly known as: BACTRIM Take 1 tablet by mouth daily starting 05/02/2023 Start taking on: May 02, 2023   tamsulosin 0.4 MG Caps capsule Commonly known as: FLOMAX Take 0.4 mg by mouth at bedtime.               Discharge Care Instructions  (From admission, onward)           Start     Ordered   04/22/23 0000  Discharge wound care:       Comments: Wound care  Daily      Comments: Clean wounds to B lower legs with NS, apply Xeroform gauze [place] [phone]) to wound beds daily, cover with ABD pads.  Wrap bilateral legs with Kerlix roll gauze beginning just above toes and ending right below knees.  Secure with Ace bandage in same fashion as Kerlix for light compression. Patient should keep legs elevated as much as possible   04/22/23 1024            Follow-up Information     Levi Becton, MD. Schedule an appointment as soon as possible for a visit in 1 week(s).    Specialties: Family Medicine, Sports Medicine, Radiology Contact information: [phone] [address] [place] [address] [phone]         [facility] Follow up.   Why: [facility] will contact you to resume Home health services Contact information: [address] [place] [address] [phone]                Allergies  Allergen Reactions   Levaquin [Levofloxacin] Itching    Tolerated ciprofloxacin in 2022.     Other Procedures/Studies: VAS [place] LOWER EXTREMITY VENOUS (DVT)  Result Date: 04/22/2023  Lower Venous DVT Study Patient Name:  KAZIMER GRESSER  Date of Exam:   04/21/2023 Medical Rec #: [id number]      Accession #:    [id number] Date of Birth: Dec 28, 1945      Patient Gender: M Patient Age:   23 years Exam Location:  [facility] Procedure:      VAS [place] LOWER EXTREMITY VENOUS (DVT) Referring Phys: Jeoffrey Massed --------------------------------------------------------------------------------  Indications: Swelling, Edema, and Wrappings too tight, causing swelling.  Comparison Study: Previous study on 7.1.2024. Performing Technologist: Fernande Bras  Examination Guidelines: A complete evaluation includes B-mode imaging, spectral Doppler, color Doppler, and power Doppler as needed of all accessible portions of each vessel. Bilateral testing is considered an integral part of a complete examination. Limited examinations for reoccurring indications may be performed as noted. The reflux portion of the exam is performed with the patient in reverse Trendelenburg.  +---------+---------------+---------+-----------+----------+--------------+ RIGHT    CompressibilityPhasicitySpontaneityPropertiesThrombus Aging +---------+---------------+---------+-----------+----------+--------------+ CFV      Full           Yes      Yes                                  +---------+---------------+---------+-----------+----------+--------------+ SFJ      Full                                                        +---------+---------------+---------+-----------+----------+--------------+  FV Prox  Full                                                        +---------+---------------+---------+-----------+----------+--------------+ FV Mid   Full                                                        +---------+---------------+---------+-----------+----------+--------------+ FV DistalFull                                                        +---------+---------------+---------+-----------+----------+--------------+ PFV      Full                                                        +---------+---------------+---------+-----------+----------+--------------+ POP      Full           Yes      Yes                                 +---------+---------------+---------+-----------+----------+--------------+ PTV      Full                                                        +---------+---------------+---------+-----------+----------+--------------+ PERO     Full                                                        +---------+---------------+---------+-----------+----------+--------------+   +---------+---------------+---------+-----------+----------+--------------+ LEFT     CompressibilityPhasicitySpontaneityPropertiesThrombus Aging +---------+---------------+---------+-----------+----------+--------------+ CFV      Full           Yes      Yes                                 +---------+---------------+---------+-----------+----------+--------------+ SFJ      Full                                                        +---------+---------------+---------+-----------+----------+--------------+ FV Prox  Full                                                         +---------+---------------+---------+-----------+----------+--------------+  FV Mid   Full                                                        +---------+---------------+---------+-----------+----------+--------------+ FV DistalFull                                                        +---------+---------------+---------+-----------+----------+--------------+ PFV      Full                                                        +---------+---------------+---------+-----------+----------+--------------+ POP      Full           Yes      Yes                                 +---------+---------------+---------+-----------+----------+--------------+ PTV      Full                                                        +---------+---------------+---------+-----------+----------+--------------+ PERO     Full                                                        +---------+---------------+---------+-----------+----------+--------------+     Summary: BILATERAL: - No evidence of deep vein thrombosis seen in the lower extremities, bilaterally. -No evidence of popliteal cyst, bilaterally.   *See table(s) above for measurements and observations. Electronically signed by Lemar Livings MD on 04/22/2023 at 2:30:39 PM.    Final    ECHO TEE  Result Date: 03/24/2023    TRANSESOPHOGEAL ECHO REPORT   Patient Name:   Sindy Guadeloupe Date of Exam: 03/24/2023 Medical Rec #:  [id number]     Height:       74.0 in Accession #:    [id number]    Weight:       199.1 lb Date of Birth:  30-Dec-1945     BSA:          2.169 m Patient Age:    76 years      BP:           127/89 mmHg Patient Gender: M             HR:           93 bpm. Exam Location:  Inpatient Procedure: Cardiac Doppler, Color Doppler, Transesophageal Echo and 3D Echo Indications:    Bacteremia  History:        Patient has prior history of Echocardiogram examinations, most  recent 03/20/2023. CHF, COPD, Arrythmias:Atrial  Fibrillation;                 Risk Factors:Dyslipidemia. Thyroid disease, agent orange                 exposure.  Sonographer:    Milda Smart Referring Phys: Lisette Abu HILTY PROCEDURE: After discussion of the risks and benefits of a TEE, an informed consent was obtained from the patient. TEE procedure time was 11 minutes. The transesophogeal probe was passed without difficulty through the esophogus of the patient. Imaged were obtained with the patient in a supine position. Local oropharyngeal anesthetic was provided with Cetacaine. Sedation performed by different physician. The patient was monitored while under deep sedation. Anesthestetic sedation was provided intravenously by Anesthesiology: 186.76mg  of Propofol, 60mg  of Lidocaine. Image quality was good. The patient's vital signs; including heart rate, blood pressure, and oxygen saturation; remained stable throughout the procedure. The patient developed no complications during the procedure. Transgastric views not obtained: patient did not tolerate.  IMPRESSIONS  1. Left ventricular ejection fraction, by estimation, is 40 to 45%. The left ventricle has mildly decreased function. The left ventricle demonstrates global hypokinesis. The left ventricular internal cavity size was mildly dilated.  2. Right ventricular systolic function is normal. The right ventricular size is normal.  3. No left atrial/left atrial appendage thrombus was detected.  4. The mitral valve is grossly normal. Mild mitral valve regurgitation.  5. The aortic valve is tricuspid. Aortic valve regurgitation trivial to mild. No aortic stenosis is present.  6. Agitated saline contrast bubble study was negative, with no evidence of any interatrial shunt. Conclusion(s)/Recommendation(s): No evidence of vegetation/infective endocarditis on this transesophageael echocardiogram. FINDINGS  Left Ventricle: Left ventricular ejection fraction, by estimation, is 40 to 45%. The left ventricle has mildly  decreased function. The left ventricle demonstrates global hypokinesis. The left ventricular internal cavity size was mildly dilated. There is  no left ventricular hypertrophy. Right Ventricle: The right ventricular size is normal. No increase in right ventricular wall thickness. Right ventricular systolic function is normal. Left Atrium: Left atrial size was normal in size. No left atrial/left atrial appendage thrombus was detected. Right Atrium: Right atrial size was normal in size. Pericardium: There is no evidence of pericardial effusion. Mitral Valve: The mitral valve is grossly normal. There is mild thickening of the posterior mitral valve leaflet(s). Mild mitral valve regurgitation. Tricuspid Valve: The tricuspid valve is grossly normal. Tricuspid valve regurgitation is mild. Aortic Valve: The aortic valve is tricuspid. Aortic valve regurgitation trivial to mild. No aortic stenosis is present. Pulmonic Valve: The pulmonic valve was grossly normal. Pulmonic valve regurgitation is trivial. Aorta: The aortic root and ascending aorta are structurally normal, with no evidence of dilitation. There is minimal (Grade I) plaque involving the aortic arch and descending aorta. IAS/Shunts: The interatrial septum is aneurysmal. No atrial level shunt detected by color flow Doppler. Agitated saline contrast was given intravenously to evaluate for intracardiac shunting. Agitated saline contrast bubble study was negative, with no evidence of any interatrial shunt. Additional Comments: Spectral Doppler performed. Zoila Shutter MD Electronically signed by Zoila Shutter MD Signature Date/Time: 03/24/2023/6:30:11 PM    Final    EP STUDY  Result Date: 03/24/2023 See surgical note for result.    TODAY-DAY OF DISCHARGE:  Subjective:   Levi [name] today has no headache,no chest abdominal pain,no new weakness tingling or numbness, feels much better wants to go home today.   Objective:   Blood pressure  119/67, pulse 94,  temperature 98.3 F (36.8 C), temperature source Oral, resp. rate 13, height 6\' 2"  (1.88 m), weight 87.5 kg, SpO2 (!) 79%. No intake or output data in the 24 hours ending 04/23/23 0959 Filed Weights   04/17/23 1226  Weight: 87.5 kg    Exam: Awake Alert, Oriented *3, No new F.N deficits, Normal affect [place].AT,PERRAL Supple Neck,No JVD, No cervical lymphadenopathy appriciated.  Symmetrical Chest wall movement, Good air movement bilaterally, CTAB RRR,No Gallops,Rubs or new Murmurs, No Parasternal Heave +ve B.Sounds, Abd Soft, Non tender, No organomegaly appriciated, No rebound -guarding or rigidity. No Cyanosis, Clubbing or edema, No new Rash or bruise   PERTINENT RADIOLOGIC STUDIES: VAS [place] LOWER EXTREMITY VENOUS (DVT)  Result Date: 04/22/2023  Lower Venous DVT Study Patient Name:  MADAN HANNEL  Date of Exam:   04/21/2023 Medical Rec #: [id number]      Accession #:    [id number] Date of Birth: 05-13-1946      Patient Gender: M Patient Age:   52 years Exam Location:  [facility] Procedure:      VAS [place] LOWER EXTREMITY VENOUS (DVT) Referring Phys: Jeoffrey Massed --------------------------------------------------------------------------------  Indications: Swelling, Edema, and Wrappings too tight, causing swelling.  Comparison Study: Previous study on 7.1.2024. Performing Technologist: Fernande Bras  Examination Guidelines: A complete evaluation includes B-mode imaging, spectral Doppler, color Doppler, and power Doppler as needed of all accessible portions of each vessel. Bilateral testing is considered an integral part of a complete examination. Limited examinations for reoccurring indications may be performed as noted. The reflux portion of the exam is performed with the patient in reverse Trendelenburg.  +---------+---------------+---------+-----------+----------+--------------+ RIGHT    CompressibilityPhasicitySpontaneityPropertiesThrombus Aging  +---------+---------------+---------+-----------+----------+--------------+ CFV      Full           Yes      Yes                                 +---------+---------------+---------+-----------+----------+--------------+ SFJ      Full                                                        +---------+---------------+---------+-----------+----------+--------------+ FV Prox  Full                                                        +---------+---------------+---------+-----------+----------+--------------+ FV Mid   Full                                                        +---------+---------------+---------+-----------+----------+--------------+ FV DistalFull                                                        +---------+---------------+---------+-----------+----------+--------------+ PFV      Full                                                        +---------+---------------+---------+-----------+----------+--------------+  POP      Full           Yes      Yes                                 +---------+---------------+---------+-----------+----------+--------------+ PTV      Full                                                        +---------+---------------+---------+-----------+----------+--------------+ PERO     Full                                                        +---------+---------------+---------+-----------+----------+--------------+   +---------+---------------+---------+-----------+----------+--------------+ LEFT     CompressibilityPhasicitySpontaneityPropertiesThrombus Aging +---------+---------------+---------+-----------+----------+--------------+ CFV      Full           Yes      Yes                                 +---------+---------------+---------+-----------+----------+--------------+ SFJ      Full                                                         +---------+---------------+---------+-----------+----------+--------------+ FV Prox  Full                                                        +---------+---------------+---------+-----------+----------+--------------+ FV Mid   Full                                                        +---------+---------------+---------+-----------+----------+--------------+ FV DistalFull                                                        +---------+---------------+---------+-----------+----------+--------------+ PFV      Full                                                        +---------+---------------+---------+-----------+----------+--------------+ POP      Full           Yes      Yes                                 +---------+---------------+---------+-----------+----------+--------------+  PTV      Full                                                        +---------+---------------+---------+-----------+----------+--------------+ PERO     Full                                                        +---------+---------------+---------+-----------+----------+--------------+     Summary: BILATERAL: - No evidence of deep vein thrombosis seen in the lower extremities, bilaterally. -No evidence of popliteal cyst, bilaterally.   *See table(s) above for measurements and observations. Electronically signed by Lemar Livings MD on 04/22/2023 at 2:30:39 PM.    Final      PERTINENT LAB RESULTS: CBC: Recent Labs    04/22/23 0358  WBC 6.8  HGB 10.7*  HCT 32.8*  PLT 179   CMET CMP     Component Value Date/Time   NA 132 (L) 04/23/2023 0441   NA 136 04/17/2023 0909   K 3.8 04/23/2023 0441   CL 99 04/23/2023 0441   CO2 21 (L) 04/23/2023 0441   GLUCOSE 129 (H) 04/23/2023 0441   BUN 12 04/23/2023 0441   BUN 16 04/17/2023 0909   CREATININE 0.90 04/23/2023 0441   CREATININE 0.78 03/31/2023 1442   CALCIUM 8.6 (L) 04/23/2023 0441   PROT 5.0 (L) 04/18/2023 0416    PROT 5.9 (L) 04/17/2023 0909   ALBUMIN 2.6 (L) 04/18/2023 0416   ALBUMIN 4.0 04/17/2023 0909   AST 16 04/18/2023 0416   AST 19 02/28/2020 1440   ALT 14 04/18/2023 0416   ALT 15 02/28/2020 1440   ALKPHOS 69 04/18/2023 0416   BILITOT 1.1 04/18/2023 0416   BILITOT 0.7 04/17/2023 0909   BILITOT 0.6 02/28/2020 1440   EGFR 87 04/17/2023 0909   GFRNONAA >60 04/23/2023 0441   GFRNONAA >60 02/28/2020 1440    GFR Estimated Creatinine Clearance: 81.2 mL/min (by C-G formula based on SCr of 0.9 mg/dL). No results for input(s): "LIPASE", "AMYLASE" in the last 72 hours. No results for input(s): "CKTOTAL", "CKMB", "CKMBINDEX", "TROPONINI" in the last 72 hours. Invalid input(s): "POCBNP" No results for input(s): "DDIMER" in the last 72 hours. No results for input(s): "HGBA1C" in the last 72 hours. No results for input(s): "CHOL", "HDL", "LDLCALC", "TRIG", "CHOLHDL", "LDLDIRECT" in the last 72 hours. No results for input(s): "TSH", "T4TOTAL", "T3FREE", "THYROIDAB" in the last 72 hours.  Invalid input(s): "FREET3" No results for input(s): "VITAMINB12", "FOLATE", "FERRITIN", "TIBC", "IRON", "RETICCTPCT" in the last 72 hours. Coags: No results for input(s): "INR" in the last 72 hours.  Invalid input(s): "PT" Microbiology: Recent Results (from the past 240 hour(s))  Blood culture (routine x 2)     Status: None   Collection Time: 04/17/23  1:58 PM   Specimen: BLOOD RIGHT ARM  Result Value Ref Range Status   Specimen Description BLOOD RIGHT ARM  Final   Special Requests   Final    BOTTLES DRAWN AEROBIC AND ANAEROBIC Blood Culture adequate volume   Culture   Final    NO GROWTH 5 DAYS Performed at [facility], [address], [place], [place] [address]  Report Status 04/22/2023 FINAL  Final  Blood culture (routine x 2)     Status: None   Collection Time: 04/17/23  8:14 PM   Specimen: BLOOD  Result Value Ref Range Status   Specimen Description BLOOD SITE NOT SPECIFIED  Final    Special Requests   Final    BOTTLES DRAWN AEROBIC AND ANAEROBIC Blood Culture results may not be optimal due to an inadequate volume of blood received in culture bottles   Culture   Final    NO GROWTH 5 DAYS Performed at [facility], [address], [place], [place] [address]    Report Status 04/22/2023 FINAL  Final  Aerobic Culture w Gram Stain (superficial specimen)     Status: None   Collection Time: 04/18/23 12:10 AM   Specimen: Wound  Result Value Ref Range Status   Specimen Description WOUND  Final   Special Requests LEFT LEG  Final   Gram Stain   Final    FEW WBC PRESENT,BOTH PMN AND MONONUCLEAR NO ORGANISMS SEEN Performed at [facility], [address] [place], [place] [address]    Culture   Final    RARE PSEUDOMONAS AERUGINOSA RARE STAPHYLOCOCCUS HAEMOLYTICUS SUSCEPTIBILITIES PERFORMED ON PREVIOUS CULTURE WITHIN THE LAST 5 DAYS. PSEUDOMONAS AERUGINOSA    Report Status 04/21/2023 FINAL  Final   Organism ID, Bacteria STAPHYLOCOCCUS HAEMOLYTICUS  Final      Susceptibility   Staphylococcus haemolyticus - MIC*    CIPROFLOXACIN <=0.5 SENSITIVE Sensitive     ERYTHROMYCIN >=8 RESISTANT Resistant     GENTAMICIN <=0.5 SENSITIVE Sensitive     OXACILLIN >=4 RESISTANT Resistant     TETRACYCLINE >=16 RESISTANT Resistant     VANCOMYCIN 1 SENSITIVE Sensitive     TRIMETH/SULFA <=10 SENSITIVE Sensitive     CLINDAMYCIN RESISTANT Resistant     RIFAMPIN <=0.5 SENSITIVE Sensitive     Inducible Clindamycin POSITIVE Resistant     * RARE STAPHYLOCOCCUS HAEMOLYTICUS  Aerobic Culture w Gram Stain (superficial specimen)     Status: None   Collection Time: 04/18/23 12:40 AM   Specimen: Wound  Result Value Ref Range Status   Specimen Description WOUND  Final   Special Requests RIGHT LEG  Final   Gram Stain   Final    RARE WBC PRESENT, PREDOMINANTLY MONONUCLEAR NO ORGANISMS SEEN Performed at [facility], [address] [place], [place] [address]    Culture RARE  PSEUDOMONAS AERUGINOSA  Final   Report Status 04/21/2023 FINAL  Final   Organism ID, Bacteria PSEUDOMONAS AERUGINOSA  Final      Susceptibility   Pseudomonas aeruginosa - MIC*    CEFTAZIDIME 4 SENSITIVE Sensitive     CIPROFLOXACIN <=0.25 SENSITIVE Sensitive     GENTAMICIN <=1 SENSITIVE Sensitive     IMIPENEM 1 SENSITIVE Sensitive     PIP/TAZO 8 SENSITIVE Sensitive ug/mL    CEFEPIME 2 SENSITIVE Sensitive     * RARE PSEUDOMONAS AERUGINOSA    FURTHER DISCHARGE INSTRUCTIONS:  Get Medicines reviewed and adjusted: Please take all your medications with you for your next visit with your Primary MD  Laboratory/radiological data: Please request your Primary MD to go over all hospital tests and procedure/radiological results at the follow up, please ask your Primary MD to get all Hospital records sent to his/her office.  In some cases, they will be blood work, cultures and biopsy results pending at the time of your discharge. Please request that your primary care M.D. goes through all  the records of your hospital data and follows up on these results.  Also Note the following: If you experience worsening of your admission symptoms, develop shortness of breath, life threatening emergency, suicidal or homicidal thoughts you must seek medical attention immediately by calling 911 or calling your MD immediately  if symptoms less severe.  You must read complete instructions/literature along with all the possible adverse reactions/side effects for all the Medicines you take and that have been prescribed to you. Take any new Medicines after you have completely understood and accpet all the possible adverse reactions/side effects.   Do not drive when taking Pain medications or sleeping medications (Benzodaizepines)  Do not take more than prescribed Pain, Sleep and Anxiety Medications. It is not advisable to combine anxiety,sleep and pain medications without talking with your primary care  practitioner  Special Instructions: If you have smoked or chewed Tobacco  in the last 2 yrs please stop smoking, stop any regular Alcohol  and or any Recreational drug use.  Wear Seat belts while driving.  Please note: You were cared for by a hospitalist during your hospital stay. Once you are discharged, your primary care physician will handle any further medical issues. Please note that NO REFILLS for any discharge medications will be authorized once you are discharged, as it is imperative that you return to your primary care physician (or establish a relationship with a primary care physician if you do not have one) for your post hospital discharge needs so that they can reassess your need for medications and monitor your lab values.  Total Time spent coordinating discharge including counseling, education and face to face time equals greater than 30 minutes.  SignedJeoffrey Massed 04/23/2023 9:59 AM

## 2023-04-22 NOTE — Progress Notes (Signed)
Mobility Specialist Progress Note;   04/22/23 1120  Mobility  Activity Stood at bedside  Level of Assistance Minimal assist, patient does 75% or more  Assistive Device Front wheel walker  Activity Response Tolerated fair  Mobility Referral Yes  $Mobility charge 1 Mobility  Mobility Specialist Start Time (ACUTE ONLY) 1120  Mobility Specialist Stop Time (ACUTE ONLY) 1130  Mobility Specialist Time Calculation (min) (ACUTE ONLY) 10 min    Post-mobility: BP 98/61 (73)  Pt agreeable to mobility. Required MinA for STS as pt seemed a bit unbalanced with standing. Upon standing , pt stated he was feeling dizzy/lightheaded requiring pt to sit back down on EOB. Took a sitting BP (see above). No further ambulation during session. Pt left sitting on EOB with all needs met. RN notified and in room.   Caesar Bookman Mobility Specialist Please contact via SecureChat or Rehab Office [phone]

## 2023-04-22 NOTE — Progress Notes (Signed)
Per RN Patient having dizzy spells when attempting to get up Hold d/c Gently hydrate for a few hours Stress dose steroids Re-assess tomorrow

## 2023-04-22 NOTE — Plan of Care (Signed)
  Problem: Education: Goal: Knowledge of General Education information will improve Description: Including pain rating scale, medication(s)/side effects and non-pharmacologic comfort measures Outcome: Progressing   Problem: Health Behavior/Discharge Planning: Goal: Ability to manage health-related needs will improve Outcome: Progressing   Problem: Clinical Measurements: Goal: Ability to maintain clinical measurements within normal limits will improve Outcome: Progressing Goal: Will remain free from infection Outcome: Progressing Goal: Diagnostic test results will improve Outcome: Progressing Goal: Respiratory complications will improve Outcome: Progressing Goal: Cardiovascular complication will be avoided Outcome: Progressing   Problem: Nutrition: Goal: Adequate nutrition will be maintained Outcome: Progressing   Problem: Activity: Goal: Risk for activity intolerance will decrease Outcome: Progressing   Problem: Coping: Goal: Level of anxiety will decrease Outcome: Progressing   Problem: Elimination: Goal: Will not experience complications related to bowel motility Outcome: Progressing Goal: Will not experience complications related to urinary retention Outcome: Progressing   Problem: Pain Management: Goal: General experience of comfort will improve Outcome: Progressing   Problem: Safety: Goal: Ability to remain free from injury will improve Outcome: Progressing   Problem: Skin Integrity: Goal: Risk for impaired skin integrity will decrease Outcome: Progressing   Problem: Clinical Measurements: Goal: Ability to avoid or minimize complications of infection will improve Outcome: Progressing   Problem: Skin Integrity: Goal: Skin integrity will improve Outcome: Progressing

## 2023-04-22 NOTE — Progress Notes (Signed)
Patient given discharge instructions to include self care and follow up appointment. Pharmacy came to bedside to give detail instructions on the antibiotics. Patient verbalizes understanding of discharge instructions and medications.

## 2023-04-22 NOTE — TOC Transition Note (Signed)
Transition of Care [facility]) - CM/SW Discharge Note   Patient Details  Name: Levi [name] MRN: [id number] Date of Birth: 12/15/1945  Transition of Care [facility]) CM/SW Contact:  Gordy Clement, RN Phone Number: 04/22/2023, 11:24 AM   Clinical Narrative:     Patient will DC to home today with Wife. [facility] will resume HH services. No DME needed. Wife will transport           Patient Goals and CMS Choice      Discharge Placement                         Discharge Plan and Services Additional resources added to the After Visit Summary for                                       Social Determinants of Health (SDOH) Interventions SDOH Screenings   Food Insecurity: No Food Insecurity (04/17/2023)  Housing: Low Risk  (04/17/2023)  Transportation Needs: No Transportation Needs (04/17/2023)  Utilities: Not At Risk (04/17/2023)  Alcohol Screen: Low Risk  (10/03/2022)  Depression (PHQ2-9): Low Risk  (03/12/2023)  Financial Resource Strain: Low Risk  (03/10/2023)  Physical Activity: Sufficiently Active (03/10/2023)  Social Connections: Moderately Integrated (03/10/2023)  Stress: No Stress Concern Present (03/10/2023)  Tobacco Use: Low Risk  (04/17/2023)     Readmission Risk Interventions     No data to display

## 2023-04-22 NOTE — Progress Notes (Signed)
PT Cancellation Note  Patient Details Name: DOMINIK RUSTON MRN: [id number] DOB: 06-10-1945   Cancelled Treatment:    Reason Eval/Treat Not Completed: Medical issues which prohibited therapy  Pt just seen by OT (~2 hr ago) and was orthostatic with sitting.  Noted MD note reports will gently hydrate for a few hours and RN confirmed no bolus.  As pt just orthostatic with OT, suspect no change this soon after.  Will plan to f/u tomorrow for more accurate assessment of pt's mobility and activity tolerance.  Anise Salvo, PT Acute Rehab [facility] [phone]   Rayetta Humphrey 04/22/2023, 4:16 PM

## 2023-04-23 LAB — BASIC METABOLIC PANEL
Anion gap: 12 (ref 5–15)
BUN: 12 mg/dL (ref 8–23)
CO2: 21 mmol/L — ABNORMAL LOW (ref 22–32)
Calcium: 8.6 mg/dL — ABNORMAL LOW (ref 8.9–10.3)
Chloride: 99 mmol/L (ref 98–111)
Creatinine, Ser: 0.9 mg/dL (ref 0.61–1.24)
GFR, Estimated: >60 mL/min (ref >60)
Glucose, Bld: 129 mg/dL — ABNORMAL HIGH (ref 70–99)
Potassium: 3.8 mmol/L (ref 3.5–5.1)
Sodium: 132 mmol/L — ABNORMAL LOW (ref 135–145)

## 2023-04-23 NOTE — Progress Notes (Signed)
Explained discharge instructions to patient. Reviewed follow up appointment and next medication administration times. Also reviewed education. Patient verbalized having an understanding for instructions given. All belongings are in the patient's possession to include TOC meds. Was sen home with patient's wife yesterday. IV and telemetry were removed by patientn's RN. CCMD was notified. No other needs verbalized. Transporting downstairs to the discharge lounge to await wife.

## 2023-04-23 NOTE — Plan of Care (Signed)
  Problem: Health Behavior/Discharge Planning: Goal: Ability to manage health-related needs will improve Outcome: Progressing   

## 2023-04-23 NOTE — Progress Notes (Signed)
PT Cancellation Note  Patient Details Name: FRANCISCA SHIVELY MRN: [id number] DOB: 04/20/1946   Cancelled Treatment:    Reason Eval/Treat Not Completed: PT screened, no needs identified, will sign off (Per MD and OT, Pt able to ambulate to nurses station and back. PT will sign off.)   Gladys Damme 04/23/2023, 10:53 AM

## 2023-04-23 NOTE — Progress Notes (Signed)
Feels much better this morning He is easily able to sit up-and actually ambulated with his cane to the nursing station and back.  He has no dizziness.  He feels that he is back to his baseline. Stable for discharge today

## 2023-04-25 DIAGNOSIS — I480 Paroxysmal atrial fibrillation: Secondary | ICD-10-CM | POA: Diagnosis not present

## 2023-04-25 DIAGNOSIS — E785 Hyperlipidemia, unspecified: Secondary | ICD-10-CM | POA: Diagnosis not present

## 2023-04-25 DIAGNOSIS — I083 Combined rheumatic disorders of mitral, aortic and tricuspid valves: Secondary | ICD-10-CM | POA: Diagnosis not present

## 2023-04-25 DIAGNOSIS — M48061 Spinal stenosis, lumbar region without neurogenic claudication: Secondary | ICD-10-CM | POA: Diagnosis not present

## 2023-04-25 DIAGNOSIS — N400 Enlarged prostate without lower urinary tract symptoms: Secondary | ICD-10-CM | POA: Diagnosis not present

## 2023-04-25 DIAGNOSIS — I872 Venous insufficiency (chronic) (peripheral): Secondary | ICD-10-CM | POA: Diagnosis not present

## 2023-04-25 DIAGNOSIS — J4489 Other specified chronic obstructive pulmonary disease: Secondary | ICD-10-CM | POA: Diagnosis not present

## 2023-04-25 DIAGNOSIS — M810 Age-related osteoporosis without current pathological fracture: Secondary | ICD-10-CM | POA: Diagnosis not present

## 2023-04-25 DIAGNOSIS — E039 Hypothyroidism, unspecified: Secondary | ICD-10-CM | POA: Diagnosis not present

## 2023-04-25 DIAGNOSIS — L03115 Cellulitis of right lower limb: Secondary | ICD-10-CM | POA: Diagnosis not present

## 2023-04-25 DIAGNOSIS — L97821 Non-pressure chronic ulcer of other part of left lower leg limited to breakdown of skin: Secondary | ICD-10-CM | POA: Diagnosis not present

## 2023-04-25 DIAGNOSIS — I110 Hypertensive heart disease with heart failure: Secondary | ICD-10-CM | POA: Diagnosis not present

## 2023-04-25 DIAGNOSIS — A419 Sepsis, unspecified organism: Secondary | ICD-10-CM | POA: Diagnosis not present

## 2023-04-25 DIAGNOSIS — I2510 Atherosclerotic heart disease of native coronary artery without angina pectoris: Secondary | ICD-10-CM | POA: Diagnosis not present

## 2023-04-25 DIAGNOSIS — N319 Neuromuscular dysfunction of bladder, unspecified: Secondary | ICD-10-CM | POA: Diagnosis not present

## 2023-04-25 DIAGNOSIS — L97811 Non-pressure chronic ulcer of other part of right lower leg limited to breakdown of skin: Secondary | ICD-10-CM | POA: Diagnosis not present

## 2023-04-25 DIAGNOSIS — J4540 Moderate persistent asthma, uncomplicated: Secondary | ICD-10-CM | POA: Diagnosis not present

## 2023-04-25 DIAGNOSIS — I951 Orthostatic hypotension: Secondary | ICD-10-CM | POA: Diagnosis not present

## 2023-04-25 DIAGNOSIS — I5042 Chronic combined systolic (congestive) and diastolic (congestive) heart failure: Secondary | ICD-10-CM | POA: Diagnosis not present

## 2023-04-25 DIAGNOSIS — E876 Hypokalemia: Secondary | ICD-10-CM | POA: Diagnosis not present

## 2023-04-25 DIAGNOSIS — G629 Polyneuropathy, unspecified: Secondary | ICD-10-CM | POA: Diagnosis not present

## 2023-04-25 DIAGNOSIS — J439 Emphysema, unspecified: Secondary | ICD-10-CM | POA: Diagnosis not present

## 2023-04-25 DIAGNOSIS — K219 Gastro-esophageal reflux disease without esophagitis: Secondary | ICD-10-CM | POA: Diagnosis not present

## 2023-04-25 DIAGNOSIS — M109 Gout, unspecified: Secondary | ICD-10-CM | POA: Diagnosis not present

## 2023-04-25 DIAGNOSIS — L03116 Cellulitis of left lower limb: Secondary | ICD-10-CM | POA: Diagnosis not present

## 2023-04-28 ENCOUNTER — Telehealth: Payer: Self-pay

## 2023-04-28 NOTE — Telephone Encounter (Signed)
Copied from CRM [id number]. Topic: Appointments - Scheduling Inquiry for Clinic >> Apr 28, 2023  8:27 AM Colletta Maryland S wrote: Reason for CRM: pt's wife Raequon Zettler) called in stating pt was recently hospitalized and would like him to get seen sooner than upcoming appt on 12/10. Call back number is [phone]

## 2023-04-28 NOTE — Telephone Encounter (Signed)
Patient scheduled.

## 2023-04-29 ENCOUNTER — Telehealth: Payer: Self-pay | Admitting: *Deleted

## 2023-04-29 NOTE — Progress Notes (Signed)
  Care Coordination   Note   04/29/2023 Name: Levi [name] MRN: [id number] DOB: Nov 21, 1945  Levi [name] is a 77 y.o. year old male who sees Thekkekandam, [name], MD for primary care. I reached out to Levi [name] by phone today to offer care coordination services.  Mr. Dezeeuw was given information about Care Coordination services today including:   The Care Coordination services include support from the care team which includes your Nurse Coordinator, Clinical Social Worker, or Pharmacist.  The Care Coordination team is here to help remove barriers to the health concerns and goals most important to you. Care Coordination services are voluntary, and the patient may decline or stop services at any time by request to their care team member.   Care Coordination Consent Status: Patient agreed to services and verbal consent obtained.   Follow up plan:  Telephone appointment with care coordination team member scheduled for:  05/05/2023  Encounter Outcome:  Patient Scheduled from referral   Burman Nieves, [facility] Care Coordination Care Guide Direct Dial: [phone]

## 2023-05-02 ENCOUNTER — Encounter: Payer: Self-pay | Admitting: Sports Medicine

## 2023-05-02 ENCOUNTER — Ambulatory Visit (INDEPENDENT_AMBULATORY_CARE_PROVIDER_SITE_OTHER): Payer: Medicare Other | Admitting: Sports Medicine

## 2023-05-02 VITALS — BP 110/74 | HR 111

## 2023-05-02 DIAGNOSIS — L97202 Non-pressure chronic ulcer of unspecified calf with fat layer exposed: Secondary | ICD-10-CM | POA: Diagnosis not present

## 2023-05-02 DIAGNOSIS — I83002 Varicose veins of unspecified lower extremity with ulcer of calf: Secondary | ICD-10-CM | POA: Diagnosis not present

## 2023-05-02 DIAGNOSIS — R42 Dizziness and giddiness: Secondary | ICD-10-CM

## 2023-05-02 MED ORDER — MECLIZINE HCL 25 MG PO TABS: 25.0000 mg | ORAL_TABLET | Freq: Three times a day (TID) | ORAL | 0 refills | Status: DC | PRN

## 2023-05-02 NOTE — Progress Notes (Signed)
    Procedures performed today:    None.  Independent interpretation of notes and tests performed by another provider:   None.  Brief History, Exam, Impression, and Recommendations:    Venous stasis ulcer (HCC) Levi [name] has had a fairly complicated course with skin tears and infections in his legs followed by bacteremia, hospitalization, IV antibiotics, he does have a PICC line placed. He was discharged, bumped into an oven door and had additional skin tears, readmitted, now discharged again. His lower extremity wounds are healing well, he has 2 on the right lower calf and 1 on the left. The are closing well, they do not appear infected. He does have significant bilateral lower extremity venous stasis dermatitis. We redressed the wounds with Xeroform, nonstick dressing and gauze wrap. I will go and check labs, CBC and BMP were recommended. I would like to see him back in about a month. He does have some time left on Bactrim. He is established with infectious disease.  Vertigo Complaining of random episodes of vertigo, he did well with the Epley maneuver in the past, I would like him to continue this, adding some meclizine. He understands that this can cause sedation. If insufficient improvement over a month we will put him back into vestibular therapy.    ____________________________________________ Levi [name]. [name], M.D., ABFM., CAQSM., AME. Primary Care and Sports Medicine [facility]  Adjunct Professor of Family Medicine  [employer]  Restaurant manager, fast food

## 2023-05-02 NOTE — Assessment & Plan Note (Signed)
Levi [name] has had a fairly complicated course with skin tears and infections in his legs followed by bacteremia, hospitalization, IV antibiotics, he does have a PICC line placed. He was discharged, bumped into an oven door and had additional skin tears, readmitted, now discharged again. His lower extremity wounds are healing well, he has 2 on the right lower calf and 1 on the left. The are closing well, they do not appear infected. He does have significant bilateral lower extremity venous stasis dermatitis. We redressed the wounds with Xeroform, nonstick dressing and gauze wrap. I will go and check labs, CBC and BMP were recommended. I would like to see him back in about a month. He does have some time left on Bactrim. He is established with infectious disease.

## 2023-05-02 NOTE — Assessment & Plan Note (Signed)
Complaining of random episodes of vertigo, he did well with the Epley maneuver in the past, I would like him to continue this, adding some meclizine. He understands that this can cause sedation. If insufficient improvement over a month we will put him back into vestibular therapy.

## 2023-05-03 LAB — CBC
Hematocrit: 35 % — ABNORMAL LOW (ref 37.5–51.0)
Hemoglobin: 11.3 g/dL — ABNORMAL LOW (ref 13.0–17.7)
MCH: 30.1 pg (ref 26.6–33.0)
MCHC: 32.3 g/dL (ref 31.5–35.7)
MCV: 93 fL (ref 79–97)
Platelets: 266 10*3/uL (ref 150–450)
RBC: 3.76 x10E6/uL — ABNORMAL LOW (ref 4.14–5.80)
RDW: 16 % — ABNORMAL HIGH (ref 11.6–15.4)
WBC: 15.5 10*3/uL — ABNORMAL HIGH (ref 3.4–10.8)

## 2023-05-03 LAB — BASIC METABOLIC PANEL
BUN/Creatinine Ratio: 17 (ref 10–24)
BUN: 19 mg/dL (ref 8–27)
CO2: 22 mmol/L (ref 20–29)
Calcium: 9.3 mg/dL (ref 8.6–10.2)
Chloride: 100 mmol/L (ref 96–106)
Creatinine, Ser: 1.13 mg/dL (ref 0.76–1.27)
Glucose: 80 mg/dL (ref 70–99)
Potassium: 4.7 mmol/L (ref 3.5–5.2)
Sodium: 137 mmol/L (ref 134–144)
eGFR: 67 mL/min/{1.73_m2} (ref >59)

## 2023-05-05 ENCOUNTER — Ambulatory Visit: Payer: Self-pay | Admitting: Licensed Clinical Social Worker

## 2023-05-06 ENCOUNTER — Ambulatory Visit: Payer: Medicare Other | Admitting: Sports Medicine

## 2023-05-06 ENCOUNTER — Inpatient Hospital Stay: Payer: Medicare Other | Admitting: Internal Medicine

## 2023-05-06 NOTE — Patient Outreach (Signed)
  Care Coordination   Initial Visit Note   05/06/2023 Name: ABOUBAKAR EBBS MRN: [id number] DOB: 01/17/1946  Sindy Guadeloupe is a 77 y.o. year old male who sees Thekkekandam, [name], MD for primary care. I spoke with  Sindy Guadeloupe by phone today.  What matters to the patients health and wellness today?  LCSW A. Felton Clinton and Mr. Jayson completed emmi call. Mr. Guida reports no further concerns.     Goals Addressed   None     SDOH assessments and interventions completed:  No  SDOH Interventions Today    Flowsheet Row Most Recent Value  SDOH Interventions   Food Insecurity Interventions Intervention Not Indicated  Housing Interventions Intervention Not Indicated  Utilities Interventions Intervention Not Indicated        Care Coordination Interventions:  No, not indicated   Follow up plan: No further intervention required.   Encounter Outcome:  Patient Visit Completed    Gwyndolyn Saxon MSW, LCSW Licensed Clinical Social Worker  [facility], Population Health Direct Dial: [phone]  Fax: [phone]

## 2023-05-07 ENCOUNTER — Ambulatory Visit (INDEPENDENT_AMBULATORY_CARE_PROVIDER_SITE_OTHER): Payer: Medicare Other | Admitting: Internal Medicine

## 2023-05-07 ENCOUNTER — Encounter: Payer: Self-pay | Admitting: Internal Medicine

## 2023-05-07 ENCOUNTER — Other Ambulatory Visit: Payer: Self-pay

## 2023-05-07 VITALS — BP 103/68 | HR 101 | Temp 97.8°F | Ht 74.0 in | Wt 203.0 lb

## 2023-05-07 DIAGNOSIS — L0390 Cellulitis, unspecified: Secondary | ICD-10-CM

## 2023-05-07 DIAGNOSIS — D72829 Elevated white blood cell count, unspecified: Secondary | ICD-10-CM | POA: Diagnosis not present

## 2023-05-07 NOTE — Patient Instructions (Addendum)
You have no sign of legs infection now  Continue legs wrap to reduce risk of infection or venous stasis dermatitis   Please check in every few months with pcp to recheck and trend your white blood cell count; if persistent high you'll need hematology evaluation   Also ask urology about what appears to be sign of lower urinary tract symptoms    Also ask pulmonology about intermittent muscle pain which can be from high prednisone use. Please make sure you get kidney function twice a year.

## 2023-05-07 NOTE — Progress Notes (Signed)
[facility]  Patient Active Problem List   Diagnosis Date Noted   Cellulitis 04/17/2023   Closed fracture of ramus of right pubis (HCC) 04/07/2023   S/P PICC central line placement 03/31/2023   Venous insufficiency of both lower extremities 03/20/2023   Chronic superficial venous thrombosis of both lower extremities 11/18/2022   Nausea and vomiting 11/12/2022   Venous stasis ulcer (HCC) 09/13/2022   Primary osteoarthritis of right shoulder, status post left shoulder arthroplasty 07/05/2022   Shingles 05/16/2022   Olecranon bursitis, right elbow 04/04/2022   Vertigo 04/04/2022   Orthostatic hypotension 03/21/2022   BPH (benign prostatic hyperplasia) 02/21/2022   Annual physical exam 01/22/2022   Charcot's joint of foot, right 01/22/2022   Lower extremity edema 08/08/2021   Heartburn    Atypical chest pain    Cervical radicular pain 06/11/2021   Chronic anticoagulation 03/15/2021   Peripheral neuropathy 02/22/2021   Chronic systolic heart failure (HCC) 02/22/2021   Essential (primary) hypertension 11/15/2020   Arthritis of carpometacarpal (CMC) joint of right thumb 08/07/2020   COPD (chronic obstructive pulmonary disease) (HCC) 03/21/2020   GERD (gastroesophageal reflux disease) status post Nissen fundoplication 12/03/2017   Pneumothorax, left 04/11/2017   Lumbar stenosis status post T12-S1 fusion 04/07/2017   History of total right hip arthroplasty 07/31/2015   Primary osteoarthritis of right knee 06/17/2014   Thrush of mouth and esophagus (HCC) 04/03/2014   Palpitation 03/28/2014   Hyperlipidemia    History of colonic polyps 02/09/2013   Paroxysmal atrial fibrillation (HCC) 05/11/2012   Osteoarthritis of left knee 01/10/2012   Seasonal and perennial allergic rhinitis 07/23/2010   Asthma, moderate persistent 03/06/2009      Subjective:    Patient ID: Levi [name], male    DOB: 10-17-1945, 77 y.o.   MRN: [id number]  Chief  Complaint  Patient presents with   Follow-up    HPI:  Levi [name] is a 77 y.o. male here for hospital f/u  05/07/23 id clinic f/u Legs cellulitis resolved Still tired Recently tested wbc 15 and asked why Left sided groin burning at times for several weeks. No blood in urine. Does have bph No f/c  On bactrim ss 1 tablet daily     Allergies  Allergen Reactions   Levaquin [Levofloxacin] Itching    Tolerated ciprofloxacin in 2022.      Outpatient Medications Prior to Visit  Medication Sig Dispense Refill   albuterol (VENTOLIN HFA) 108 (90 Base) MCG/ACT inhaler INHALE 2 PUFFS BY MOUTH EVERY 4 HOURS AS NEEDED FOR WHEEZE OR FOR SHORTNESS OF BREATH 8.5 each 12   allopurinol (ZYLOPRIM) 100 MG tablet Take 1 tablet (100 mg total) by mouth in the morning. 90 tablet 3   atorvastatin (LIPITOR) 20 MG tablet TAKE 1 TABLET BY MOUTH EVERY DAY (Patient taking differently: Take 20 mg by mouth daily.) 90 tablet 2   cyclobenzaprine (FLEXERIL) 10 MG tablet Take 1 tablet (10 mg total) by mouth at bedtime. 90 tablet 1   diltiazem (CARDIZEM CD) 180 MG 24 hr capsule TAKE 1 CAPSULE BY MOUTH EVERY DAY 90 capsule 3   diltiazem (CARDIZEM) 30 MG tablet TAKE 1 TABLET (30 MG TOTAL) BY MOUTH AS NEEDED (HEART RATE OVER 120). 90 tablet 1   DULoxetine (CYMBALTA) 30 MG capsule Take 1 capsule (30 mg total) by mouth daily. 90 capsule 3   ELIQUIS 5 MG TABS tablet TAKE 1 TABLET BY MOUTH TWICE A DAY 180 tablet 1  ipratropium-albuterol (DUONEB) 0.5-2.5 (3) MG/3ML SOLN 1 NEBULE EVERY 6 HOURS AS NEEDED. **J45.3** (Patient taking differently: Take 3 mLs by nebulization every 6 (six) hours as needed (for shortness of breath and wheezing).) 270 mL 4   isosorbide mononitrate (IMDUR) 30 MG 24 hr tablet TAKE 1 TABLET BY MOUTH EVERY DAY 90 tablet 1   meclizine (ANTIVERT) 25 MG tablet Take 1 tablet (25 mg total) by mouth 3 (three) times daily as needed for dizziness. 30 tablet 0   Nebulizers (COMPRESSOR/NEBULIZER) MISC Use as  directed 1 each 0   Olodaterol HCl (STRIVERDI RESPIMAT) 2.5 MCG/ACT AERS INHALE 2 PUFFS BY MOUTH INTO THE LUNGS DAILY (Patient taking differently: Inhale 2 puffs into the lungs daily.) 4 g 11   ondansetron (ZOFRAN-ODT) 8 MG disintegrating tablet Take 1 tablet (8 mg total) by mouth every 8 (eight) hours as needed for nausea. 20 tablet 3   oxyCODONE-acetaminophen (PERCOCET) 10-325 MG tablet Take 1 tablet by mouth every 8 (eight) hours as needed for pain. 30 tablet 0   pantoprazole (PROTONIX) 40 MG tablet TAKE 1 TABLET (40 MG TOTAL) BY MOUTH TWICE A DAY BEFORE MEALS (Patient taking differently: Take 40 mg by mouth 2 (two) times daily.) 180 tablet 2   predniSONE (DELTASONE) 10 MG tablet Take 1 tablet (10 mg total) by mouth in the morning and at bedtime. 60 tablet 11   pregabalin (LYRICA) 100 MG capsule Take 1 capsule (100 mg total) by mouth 2 (two) times daily. 60 capsule 2   sulfamethoxazole-trimethoprim (BACTRIM) 400-80 MG tablet Take 1 tablet by mouth daily starting 05/02/2023 30 tablet 2   tamsulosin (FLOMAX) 0.4 MG CAPS capsule Take 0.4 mg by mouth at bedtime.     ciprofloxacin (CIPRO) 750 MG tablet Take 1 tablet (750 mg total) by mouth 2 (two) times daily. (Patient not taking: Reported on 05/07/2023) 18 tablet 0   leptospermum manuka honey (MEDIHONEY) PSTE paste Apply 1 Application topically daily. (Patient not taking: Reported on 05/07/2023) 44 mL 0   linezolid (ZYVOX) 600 MG tablet Take 1 tablet (600 mg total) by mouth every 12 (twelve) hours. (Patient not taking: Reported on 05/07/2023) 6 tablet 0   No facility-administered medications prior to visit.     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 Daughter lives two miles away. 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 Enjoys being active and working.   Social Determinants of Health   Financial Resource Strain: Low Risk  (03/10/2023)   Overall Financial Resource Strain (CARDIA)    Difficulty of Paying Living Expenses: Not hard at all  Food Insecurity: No Food Insecurity (05/05/2023)   Hunger Vital Sign    Worried About Running Out of Food in the Last Year: Never true  Ran Out of Food in the Last Year: Never true  Transportation Needs: No Transportation Needs (04/17/2023)   PRAPARE - Administrator, Civil Service (Medical): No    Lack of Transportation (Non-Medical): No  Physical Activity: Sufficiently Active (03/10/2023)   Exercise Vital Sign    Days of Exercise per Week: 6 days    Minutes of Exercise per Session: 60 min  Stress: No Stress Concern Present (03/10/2023)   [employer] of Occupational Health - Occupational Stress Questionnaire    Feeling of Stress : Only a little  Social Connections: Moderately Integrated (03/10/2023)   Social Connection and Isolation Panel [NHANES]    Frequency of Communication with Friends and Family: More than three times a week    Frequency of Social Gatherings with Friends and Family: More than three times a week    Attends Religious Services: More than 4 times per year    Active Member of [employer] or Organizations: No    Attends Banker Meetings: Never    Marital Status: Married  Catering manager Violence: Not At Risk (04/17/2023)   Humiliation, Afraid, Rape, and Kick  questionnaire    Fear of Current or Ex-Partner: No    Emotionally Abused: No    Physically Abused: No    Sexually Abused: No      Review of Systems    See above; other ros negative  Objective:    BP 103/68   Pulse (!) 101   Temp 97.8 F (36.6 C) (Temporal)   Ht 6\' 2"  (1.88 m)   Wt 203 lb (92.1 kg)   SpO2 99%   BMI 26.06 kg/m  Nursing note and vital signs reviewed.  Physical Exam     General/constitutional: no distress, pleasant HEENT: Normocephalic, PER, Conj Clear, EOMI, Oropharynx clear Neck supple CV: rrr no mrg Lungs: clear to auscultation, normal respiratory effort Abd: Soft, Nontender Ext/skin: bilateral lower ext edema with hyperpigmentation and scattered scabbing; no sign of cellulitis Neuro: nonfocal MSK: no peripheral joint swelling/tenderness/warmth; back spines nontender     Labs: Lab Results  Component Value Date   WBC 15.5 (H) 05/02/2023   HGB 11.3 (L) 05/02/2023   HCT 35.0 (L) 05/02/2023   MCV 93 05/02/2023   PLT 266 05/02/2023   Last metabolic panel Lab Results  Component Value Date   GLUCOSE 80 05/02/2023   NA 137 05/02/2023   K 4.7 05/02/2023   CL 100 05/02/2023   CO2 22 05/02/2023   BUN 19 05/02/2023   CREATININE 1.13 05/02/2023   EGFR 67 05/02/2023   CALCIUM 9.3 05/02/2023   PHOS 3.4 11/10/2008   PROT 5.0 (L) 04/18/2023   ALBUMIN 2.6 (L) 04/18/2023   LABGLOB 1.9 04/17/2023   AGRATIO 1.6 12/03/2019   BILITOT 1.1 04/18/2023   ALKPHOS 69 04/18/2023   AST 16 04/18/2023   ALT 14 04/18/2023   ANIONGAP 12 04/23/2023    Micro:  Serology:  Imaging:  Assessment & Plan:   Problem List Items Addressed This Visit       Other   Cellulitis - Primary   Other Visit Diagnoses     Leukocytosis, unspecified type             No orders of the defined types were placed in this encounter.    77 yo male hx mssa bacteremia due to chronic right shin wound s/p cefazolin course by 04/01/2023, afib on eliquis, hx lumbar spine  fusion, recent late 03/2023 hospital admission for bilateral open  lower ext wound, also with copd on prednisone 10 mg bid, which ID recommended starting pjp prophylaxis with bactrim  #chronic steroid use 04/21/23 started bactrim pjp prophy Prednisone for copd use 10 mg po bid -- will discuss with pulm to titrate down given higher risk than benefit and will take off bactrim 1 month after prednisone dose is < 15 mg a day He reported tried multiple times before to titrate prednisone with pulmonologist, but will defer further trial to him    #leukocytosis No sign of infection Multiple causes potentially but could f/u pcp every 3-6 to trend, and if persistent elevated potentially getting hematologist evaluation    #wound infection #venous stasis No sign of cellulitis S/p bactrim/cipro treatment course brief dose Continue wrap or try unna boot    #other complaints Fatigue/dyspnea Muscle aches intermittently LUTS chronic  -suggest f/u urology for symptoms of lower urinary tract obstruction and potentially pulm for prednisone adverse muscle effect -f/u pcp for other long term chronic fatigue    Follow-up: Return if symptoms worsen or fail to improve.      Raymondo Band, MD [facility] 05/07/2023, 2:17 PM

## 2023-05-08 ENCOUNTER — Telehealth: Payer: Self-pay

## 2023-05-08 NOTE — Telephone Encounter (Signed)
I discussed this with her yesterday, okay to close this note.

## 2023-05-08 NOTE — Telephone Encounter (Signed)
CRM # [phone] Owner: None Status: Unresolved Open  Priority: Routine Created on: 05/05/2023 08:37 AM By: Aldona Bar D   Primary Information  Source  Levi Guadeloupe "Levi [name]" (Patient)   Subject  Levi Guadeloupe "Levi [name]" (Patient)   Topic  Clinical - Lab/Test Results    Communication  Reason for CRM: Pt wife called with concerns about recent lab results. Would like to discuss with clinician. Stated previously hospitalized twice for this and needs to know what more can be done about WBC - only antibiotic pt is currently taking is bactrim. Callback # [phone]

## 2023-05-16 ENCOUNTER — Ambulatory Visit
Admission: EM | Admit: 2023-05-16 | Discharge: 2023-05-16 | Disposition: A | Discharge status: Home / Self Care | Payer: Medicare Other | Attending: Family Medicine | Admitting: Family Medicine

## 2023-05-16 ENCOUNTER — Other Ambulatory Visit: Payer: Self-pay

## 2023-05-16 DIAGNOSIS — S81812A Laceration without foreign body, left lower leg, initial encounter: Secondary | ICD-10-CM

## 2023-05-16 NOTE — ED Triage Notes (Signed)
[facility] presented to [facility] with [facility] of laceration to left calf this morning. Pt reports he was standing up from sitting on a bench and he swiped it on the bence. Pt has had pmh of sepsis from prior cuts on leg.   Laceration large and activily bleeding.

## 2023-05-16 NOTE — Discharge Instructions (Addendum)
Please read [facility] Adult instructions (attached). Change dressing daily and apply Xeroform pad to wound.  Keep wound clean and dry.  Return for any signs of infection (or follow-up with family doctor):  Increasing redness, swelling, pain, heat, drainage, etc. Elevate leg as much as possible.   You will not be able to use your support hose as usual.  Wear Ace wraps on your left leg as instructed by nurse. May take Tylenol if needed for pain. Return in 14 days for suture removal.

## 2023-05-16 NOTE — ED Provider Notes (Signed)
[name]    CSN: [id number] Arrival date & time: 05/16/23  0804      History   Chief Complaint Chief Complaint  Patient presents with   Laceration    HPI Levi [name] is a 77 y.o. male.   While standing up from a bench at 7am this morning, patient's left posterior calf scraped the edge of the bench resulting in a large laceration. Patient has a history of lower extremity venous stasis ulcers, and multiple skin tears/lacerations of his lower extremities.  He was hospitalized on 04/17/23 for treatment of bacteremia. He denies other injury this morning.  He is presently taking prophylactic Bactrim 400-80mg , one tab daily.  The history is provided by the patient and the spouse.  Laceration Location:  Leg Leg laceration location:  L lower leg Length:  7 inches Depth:  Through underlying tissue Quality: straight   Bleeding: controlled with pressure   Time since incident:  1 hour Laceration mechanism:  Blunt object Pain details:    Quality:  Aching   Severity:  Mild   Timing:  Constant   Progression:  Unchanged Foreign body present:  No foreign bodies Relieved by:  Nothing Worsened by:  Movement Tetanus status:  Up to date Associated symptoms: no fever and no focal weakness     Past Medical History:  Diagnosis Date   Agent orange exposure 1970's   takes Imdur,Diltiazem, and Atacand daily   ALLERGIC RHINITIS    Allergy    seasonal allergies   Asthma    uses inhaler and nebulizers   Bruises easily    d/t prednisone daily   Cataract    Cellulitis 03/22/2020   Chest wall pain 09/21/2019   L scapula, reported 4/27 21/     CHF (congestive heart failure) (HCC)    dx-on meds to treat   Chronic bronchitis (HCC)    "used to get it q yr; last time ?2008" (04/28/2012)   Clotting disorder (HCC) 2019   On blood thinner when I bleed it is for a consideral amount of time. This is tegardless of size of wound.   COPD (chronic obstructive pulmonary disease)  (HCC)    agent orange exposure   Degenerative disk disease    "everywhere" (04/28/2012)   Diverticulitis    Dysrhythmia    afib   Elevated uric acid in blood    takes Allopurinol daily   Emphysema of lung (HCC)    Enlarged prostate    but not on any meds   GERD (gastroesophageal reflux disease)    takes Protonix daily   H/O hiatal hernia    Headache    History of pneumonia    a. 2010   Hyperlipidemia    takes Lipitor daily; pt. states he takes a preventive   Hypothyroidism    Joint pain    Joint swelling    MSSA bacteremia 03/19/2023   Nausea and vomiting 11/12/2022   Neuromuscular disorder (HCC)    bilat legs, and bilat arms   Osteoporosis 2012   PAF (paroxysmal atrial fibrillation) (HCC)    a. Dx 04/2012, CHA2DS2VASc = 1 (age);  b. 04/2012 Echo: EF 40-50%, mild MR.   Personal history of colonic adenomas 02/09/2013   Pneumonia    Sepsis (HCC) 03/18/2023   Shortness of breath dyspnea    Thyroid disease    no taking meds-caused dizziness    Patient Active Problem List   Diagnosis Date Noted   Cellulitis 04/17/2023   Closed fracture  of ramus of right pubis (HCC) 04/07/2023   S/P PICC central line placement 03/31/2023   Venous insufficiency of both lower extremities 03/20/2023   Chronic superficial venous thrombosis of both lower extremities 11/18/2022   Nausea and vomiting 11/12/2022   Venous stasis ulcer (HCC) 09/13/2022   Primary osteoarthritis of right shoulder, status post left shoulder arthroplasty 07/05/2022   Shingles 05/16/2022   Olecranon bursitis, right elbow 04/04/2022   Vertigo 04/04/2022   Orthostatic hypotension 03/21/2022   BPH (benign prostatic hyperplasia) 02/21/2022   Annual physical exam 01/22/2022   Charcot's joint of foot, right 01/22/2022   Lower extremity edema 08/08/2021   Heartburn    Atypical chest pain    Cervical radicular pain 06/11/2021   Chronic anticoagulation 03/15/2021   Peripheral neuropathy 02/22/2021   Chronic systolic  heart failure (HCC) 11/91/4782   Essential (primary) hypertension 11/15/2020   Arthritis of carpometacarpal (CMC) joint of right thumb 08/07/2020   COPD (chronic obstructive pulmonary disease) (HCC) 03/21/2020   GERD (gastroesophageal reflux disease) status post Nissen fundoplication 12/03/2017   Pneumothorax, left 04/11/2017   Lumbar stenosis status post T12-S1 fusion 04/07/2017   History of total right hip arthroplasty 07/31/2015   Primary osteoarthritis of right knee 06/17/2014   Thrush of mouth and esophagus (HCC) 04/03/2014   Palpitation 03/28/2014   Hyperlipidemia    History of colonic polyps 02/09/2013   Paroxysmal atrial fibrillation (HCC) 05/11/2012   Osteoarthritis of left knee 01/10/2012   Seasonal and perennial allergic rhinitis 07/23/2010   Asthma, moderate persistent 03/06/2009    Past Surgical History:  Procedure Laterality Date   88 HOUR PH STUDY N/A 05/30/2021   Procedure: 24 HOUR PH STUDY;  Surgeon: Shellia Cleverly, DO;  Location: [facility];  Service: Gastroenterology;  Laterality: N/A;   ANTERIOR CERVICAL DECOMP/DISCECTOMY FUSION N/A 07/21/2012   Procedure: ANTERIOR CERVICAL DECOMPRESSION/DISCECTOMY FUSION 2 LEVELS;  Surgeon: Barnett Abu, MD;  Location: [facility];  Service: Neurosurgery;  Laterality: N/A;  C4-5 C5-6 Anterior cervical decompression/diskectomy/fusion   ANTERIOR LAT LUMBAR FUSION N/A 04/07/2017   Procedure: Thoracic twelve-Lumbar one Anterolateral decompression/fusion;  Surgeon: Barnett Abu, MD;  Location: [facility] OR;  Service: Neurosurgery;  Laterality: N/A;   ARTHRODESIS FOOT WITH WEIL OSTEOTOMY Left 11/07/2022   Procedure: LEFT THIRD METATARSOPHALANGEAL CAPSULOTOMY, WEIL OSTEOTOMY;  Surgeon: Terance Hart, MD;  Location[facility];  Service: Orthopedics;  Laterality: Left;  LENGTH OF SURGERY: 60 MINUTES   BACK SURGERY     x 3   CARDIAC CATHETERIZATION  11/2008   Dr. Anne Fu - 20% calcified non flow limiting left main, 50% EF apical  hypokinesis   CERVICAL DISC ARTHROPLASTY N/A 04/07/2017   Procedure: Cervical six-seven Disc arthroplasty;  Surgeon: Barnett Abu, MD;  Location: [facility] OR;  Service: Neurosurgery;  Laterality: N/A;   CHEST TUBE INSERTION Left 04/11/2017   Procedure: CHEST TUBE INSERTION;  Surgeon: Kerin Perna, MD;  Location: [facility] OR;  Service: Thoracic;  Laterality: Left;   CHOLECYSTECTOMY     COLONOSCOPY  2016   CG-MAC-miralax(good)servere TICS/TA x 2   ESOPHAGEAL MANOMETRY N/A 05/30/2021   Procedure: ESOPHAGEAL MANOMETRY (EM);  Surgeon: Shellia Cleverly, DO;  Location: [facility];  Service: Gastroenterology;  Laterality: N/A;  ph impedence   ESOPHAGOGASTRODUODENOSCOPY     EYE SURGERY Bilateral 2011   steroidal encapsulation" (04/28/2012)   FOOT SURGERY Right    x 2   JOINT REPLACEMENT     Many surgeries including 5 joit replacements   KNEE ARTHROSCOPY Bilateral  LATERAL / POSTERIOR COMBINED FUSION LUMBAR SPINE  2012   LEFT ATRIAL APPENDAGE OCCLUSION N/A 02/22/2021   Procedure: LEFT ATRIAL APPENDAGE OCCLUSION;  Surgeon: Lanier Prude, MD;  [facility]: [facility];  Service: Cardiovascular;  Laterality: N/A;   POLYPECTOMY  2016   severe TICS/TA x 2   POSTERIOR CERVICAL LAMINECTOMY WITH MET- RX Right 06/11/2021   Procedure: Right Cervical six-seven  Laminectomy and foraminotomy with metrex;  Surgeon: Barnett Abu, MD;  Location: [facility] OR;  Service: Neurosurgery;  Laterality: Right;   REVERSE SHOULDER ARTHROPLASTY  04/28/2012   Procedure: REVERSE SHOULDER ARTHROPLASTY;  Surgeon: Mable Paris, MD;  Location: [facility] OR;  Service: Orthopedics;  Laterality: Left;  Left shouder reverse total shoulder arthroplasty   SHOULDER SURGERY     SPINE SURGERY     I have had 7 spinal surgeries   TEE WITHOUT CARDIOVERSION N/A 02/22/2021   Procedure: TRANSESOPHAGEAL ECHOCARDIOGRAM (TEE);  Surgeon: Lanier Prude, MD;  Location: [facility];  Service: Cardiovascular;  Laterality: N/A;    TENDON REPAIR Right 08/07/2020   Procedure: RIGHT HAND LIGAMENT RECONSTRUCTION AND TENDON INTERPOSITION;  Surgeon: Jodi Geralds, MD;  Location: [facility];  Service: Orthopedics;  Laterality: Right;   TONSILLECTOMY AND ADENOIDECTOMY  1954   TOTAL HIP ARTHROPLASTY Left 2011   "left" (04/28/2012)   TOTAL HIP ARTHROPLASTY Right 07/31/2015   Procedure: TOTAL HIP ARTHROPLASTY ANTERIOR APPROACH;  Surgeon: Jodi Geralds, MD;  Location: [facility];  Service: Orthopedics;  Laterality: Right;   TOTAL KNEE ARTHROPLASTY  01/10/2012   Procedure: TOTAL KNEE ARTHROPLASTY;  Surgeon: Harvie Junior, MD;  Location: [facility] OR;  Service: Orthopedics;;  left total knee arthroplasty   TOTAL KNEE ARTHROPLASTY Right 06/17/2014   Procedure: TOTAL KNEE ARTHROPLASTY;  Surgeon: Harvie Junior, MD;  Location: [facility];  Service: Orthopedics;  Laterality: Right;   toupet fundolplication  11/01/2021   TRANSESOPHAGEAL ECHOCARDIOGRAM (CATH LAB) N/A 03/24/2023   Procedure: TRANSESOPHAGEAL ECHOCARDIOGRAM;  Surgeon: Chrystie Nose, MD;  Location: [facility];  Service: Cardiovascular;  Laterality: N/A;       Home Medications    Prior to Admission medications   Medication Sig Start Date End Date Taking? Authorizing Provider  albuterol (VENTOLIN HFA) 108 (90 Base) MCG/ACT inhaler INHALE 2 PUFFS BY MOUTH EVERY 4 HOURS AS NEEDED FOR WHEEZE OR FOR SHORTNESS OF BREATH 01/14/23   Jetty Duhamel D, MD  allopurinol (ZYLOPRIM) 100 MG tablet Take 1 tablet (100 mg total) by mouth in the morning. 11/25/22   Monica Becton, MD  atorvastatin (LIPITOR) 20 MG tablet TAKE 1 TABLET BY MOUTH EVERY DAY Patient taking differently: Take 20 mg by mouth daily. 09/17/22   Jake Bathe, MD  ciprofloxacin (CIPRO) 750 MG tablet Take 1 tablet (750 mg total) by mouth 2 (two) times daily. Patient not taking: Reported on 05/07/2023 04/23/23   Maretta Bees, MD  cyclobenzaprine (FLEXERIL) 10 MG tablet Take 1 tablet (10 mg total) by mouth  at bedtime. 03/12/23   Monica Becton, MD  diltiazem (CARDIZEM CD) 180 MG 24 hr capsule TAKE 1 CAPSULE BY MOUTH EVERY DAY 10/16/22   Jake Bathe, MD  diltiazem (CARDIZEM) 30 MG tablet TAKE 1 TABLET (30 MG TOTAL) BY MOUTH AS NEEDED (HEART RATE OVER 120). 03/10/23   Jake Bathe, MD  DULoxetine (CYMBALTA) 30 MG capsule Take 1 capsule (30 mg total) by mouth daily. 12/24/22   Lomax, Amy, NP  ELIQUIS 5 MG TABS tablet  TAKE 1 TABLET BY MOUTH TWICE A DAY 01/28/23   Jake Bathe, MD  ipratropium-albuterol (DUONEB) 0.5-2.5 (3) MG/3ML SOLN 1 NEBULE EVERY 6 HOURS AS NEEDED. **J45.3** Patient taking differently: Take 3 mLs by nebulization every 6 (six) hours as needed (for shortness of breath and wheezing). 03/22/20   Jetty Duhamel D, MD  isosorbide mononitrate (IMDUR) 30 MG 24 hr tablet TAKE 1 TABLET BY MOUTH EVERY DAY 03/10/23   Jake Bathe, MD  leptospermum manuka honey (MEDIHONEY) PSTE paste Apply 1 Application topically daily. Patient not taking: Reported on 05/07/2023 03/25/23   Jonah Blue, MD  linezolid (ZYVOX) 600 MG tablet Take 1 tablet (600 mg total) by mouth every 12 (twelve) hours. Patient not taking: Reported on 05/07/2023 04/22/23   Danelle Earthly, MD  meclizine (ANTIVERT) 25 MG tablet Take 1 tablet (25 mg total) by mouth 3 (three) times daily as needed for dizziness. 05/02/23   Monica Becton, MD  Nebulizers (COMPRESSOR/NEBULIZER) MISC Use as directed 12/01/17   Waymon Budge, MD  Olodaterol HCl (STRIVERDI RESPIMAT) 2.5 MCG/ACT AERS INHALE 2 PUFFS BY MOUTH INTO THE LUNGS DAILY Patient taking differently: Inhale 2 puffs into the lungs daily. 03/10/23   Waymon Budge, MD  ondansetron (ZOFRAN-ODT) 8 MG disintegrating tablet Take 1 tablet (8 mg total) by mouth every 8 (eight) hours as needed for nausea. 04/07/23   Monica Becton, MD  oxyCODONE-acetaminophen (PERCOCET) 10-325 MG tablet Take 1 tablet by mouth every 8 (eight) hours as needed for pain. 04/07/23    Monica Becton, MD  pantoprazole (PROTONIX) 40 MG tablet TAKE 1 TABLET (40 MG TOTAL) BY MOUTH TWICE A DAY BEFORE MEALS Patient taking differently: Take 40 mg by mouth 2 (two) times daily. 11/04/22   Monica Becton, MD  predniSONE (DELTASONE) 10 MG tablet Take 1 tablet (10 mg total) by mouth in the morning and at bedtime. 10/30/22   Jetty Duhamel D, MD  pregabalin (LYRICA) 100 MG capsule Take 1 capsule (100 mg total) by mouth 2 (two) times daily. 04/22/23   Ghimire, Werner Lean, MD  sulfamethoxazole-trimethoprim (BACTRIM) 400-80 MG tablet Take 1 tablet by mouth daily starting 05/02/2023 05/02/23   Danelle Earthly, MD  tamsulosin (FLOMAX) 0.4 MG CAPS capsule Take 0.4 mg by mouth at bedtime.    [provider]    Family History Family History  Problem Relation Age of Onset   Diabetes Mother    Heart disease Father        Died 75, MI   Cancer Father    Hyperlipidemia Father    Stroke Father    Prostate cancer Brother    Colon polyps Brother    Colon polyps Brother    Asthma Brother    COPD Brother    Colon cancer Brother        Dx age 76   Cancer Brother    Colon cancer Other    Esophageal cancer Neg Hx    Rectal cancer Neg Hx    Stomach cancer Neg Hx    Pancreatic cancer Neg Hx    Kidney disease Neg Hx    Liver disease Neg Hx    Neuropathy Neg Hx     Social History Social History   Tobacco Use   Smoking status: Never   Smokeless tobacco: Never  Vaping Use   Vaping status: Never Used  Substance Use Topics   Alcohol use: No   Drug use: No     Allergies   Levaquin [  levofloxacin]   Review of Systems Review of Systems  Constitutional:  Negative for activity change, appetite change, chills, diaphoresis and fever.  HENT: Negative.    Eyes: Negative.   Respiratory: Negative.    Cardiovascular:  Positive for leg swelling. Negative for chest pain.  Gastrointestinal: Negative.   Genitourinary: Negative.   Musculoskeletal: Negative.   Neurological:   Negative for dizziness and focal weakness.     Physical Exam Triage Vital Signs ED Triage Vitals  Encounter Vitals Group     BP 05/16/23 0846 110/64     Systolic BP Percentile --      Diastolic BP Percentile --      Pulse Rate 05/16/23 0846 98     Resp 05/16/23 0846 16     Temp 05/16/23 0846 98.4 F (36.9 C)     Temp src --      SpO2 05/16/23 0846 98 %     Weight --      Height --      Head Circumference --      Peak Flow --      Pain Score 05/16/23 0844 6     Pain Loc --      Pain Education --      Exclude from Growth Chart --    No data found.  Updated Vital Signs BP 110/64   Pulse 98   Temp 98.4 F (36.9 C)   Resp 16   SpO2 98%   Visual Acuity Right Eye Distance:   Left Eye Distance:   Bilateral Distance:    Right Eye Near:   Left Eye Near:    Bilateral Near:     Physical Exam Vitals and nursing note reviewed.  Constitutional:      General: He is not in acute distress.    Appearance: He is not ill-appearing.  HENT:     Head: Atraumatic.     Mouth/Throat:     Mouth: Mucous membranes are moist.  Eyes:     Pupils: Pupils are equal, round, and reactive to light.  Cardiovascular:     Rate and Rhythm: Normal rate.  Pulmonary:     Effort: Pulmonary effort is normal.  Musculoskeletal:     Cervical back: Normal range of motion.     Right lower leg: Edema present.     Left lower leg: Edema present.       Legs:     Comments: Left posterior leg has a 17.8cm long full thickness linear laceration as noted on diagram.  Distal neurovascular function is intact.     Skin:    General: Skin is warm and dry.  Neurological:     Mental Status: He is alert and oriented to person, place, and time.     UC Treatments / Results  Labs (all labs ordered are listed, but only abnormal results are displayed) Labs Reviewed - No data to display  EKG   Radiology No results found.  Procedures Procedures Laceration Repair Discussed benefits and risks of procedure  and verbal consent obtained. Using sterile technique and local anesthesia with 1% lidocaine with epinephrine, cleansed wound with Betadine followed by copious lavage with normal saline.  Wound carefully inspected for debris and foreign bodies; none found.  Wound closed with #26, 4-0 interrupted nylon sutures. Xeroform dressing applied, followed by application of ace wraps from toes to below knee in graduated compression fashion.  Wound precautions explained to patient and wife.  Return for followup tomorrow and suture removal 14 days.  Medications Ordered in UC Medications - No data to display  Initial Impression / Assessment and Plan / UC Course  I have reviewed the triage vital signs and the nursing notes.  Pertinent labs & imaging results that were available during my care of the patient were reviewed by me and considered in my medical decision making (see chart for details).    With his history of recent sepsis, patient will need close follow-up. Return for follow-up tomorrow. Recommend regular follow-up by wound-care clinic.  Final Clinical Impressions(s) / UC Diagnoses   Final diagnoses:  Laceration of left lower leg, initial encounter     Discharge Instructions      Please read Laceration Care Adult instructions (attached). Change dressing daily and apply Xeroform pad to wound.  Keep wound clean and dry.  Return for any signs of infection (or follow-up with family doctor):  Increasing redness, swelling, pain, heat, drainage, etc. Elevate leg as much as possible.   You will not be able to use your support hose as usual.  Wear Ace wraps on your left leg as instructed by nurse. May take Tylenol if needed for pain. Return in 14 days for suture removal.      ED Prescriptions   None       Lattie Haw, MD 05/17/23 [phone]

## 2023-05-17 ENCOUNTER — Ambulatory Visit
Admission: EM | Admit: 2023-05-17 | Discharge: 2023-05-17 | Disposition: A | Discharge status: Home / Self Care | Payer: Medicare Other | Attending: Family Medicine | Admitting: Family Medicine

## 2023-05-17 DIAGNOSIS — Z5189 Encounter for other specified aftercare: Secondary | ICD-10-CM

## 2023-05-17 NOTE — ED Provider Notes (Signed)
[name]    CSN: [id number] Arrival date & time: 05/17/23  1141      History   Chief Complaint Chief Complaint  Patient presents with   Wound Check    HPI Levi [name] is a 77 y.o. male.   HPI  Patient has a large laceration on the back of his left calf.  He is here to have it checked.  The dressing is on it from yesterday.  No complaints  Past Medical History:  Diagnosis Date   Agent orange exposure 1970's   takes Imdur,Diltiazem, and Atacand daily   ALLERGIC RHINITIS    Allergy    seasonal allergies   Asthma    uses inhaler and nebulizers   Bruises easily    d/t prednisone daily   Cataract    Cellulitis 03/22/2020   Chest wall pain 09/21/2019   L scapula, reported 4/27 21/     CHF (congestive heart failure) (HCC)    dx-on meds to treat   Chronic bronchitis (HCC)    "used to get it q yr; last time ?2008" (04/28/2012)   Clotting disorder (HCC) 2019   On blood thinner when I bleed it is for a consideral amount of time. This is tegardless of size of wound.   COPD (chronic obstructive pulmonary disease) (HCC)    agent orange exposure   Degenerative disk disease    "everywhere" (04/28/2012)   Diverticulitis    Dysrhythmia    afib   Elevated uric acid in blood    takes Allopurinol daily   Emphysema of lung (HCC)    Enlarged prostate    but not on any meds   GERD (gastroesophageal reflux disease)    takes Protonix daily   H/O hiatal hernia    Headache    History of pneumonia    a. 2010   Hyperlipidemia    takes Lipitor daily; pt. states he takes a preventive   Hypothyroidism    Joint pain    Joint swelling    MSSA bacteremia 03/19/2023   Nausea and vomiting 11/12/2022   Neuromuscular disorder (HCC)    bilat legs, and bilat arms   Osteoporosis 2012   PAF (paroxysmal atrial fibrillation) (HCC)    a. Dx 04/2012, CHA2DS2VASc = 1 (age);  b. 04/2012 Echo: EF 40-50%, mild MR.   Personal history of colonic adenomas 02/09/2013   Pneumonia     Sepsis (HCC) 03/18/2023   Shortness of breath dyspnea    Thyroid disease    no taking meds-caused dizziness    Patient Active Problem List   Diagnosis Date Noted   Cellulitis 04/17/2023   Closed fracture of ramus of right pubis (HCC) 04/07/2023   S/P PICC central line placement 03/31/2023   Venous insufficiency of both lower extremities 03/20/2023   Chronic superficial venous thrombosis of both lower extremities 11/18/2022   Nausea and vomiting 11/12/2022   Venous stasis ulcer (HCC) 09/13/2022   Primary osteoarthritis of right shoulder, status post left shoulder arthroplasty 07/05/2022   Shingles 05/16/2022   Olecranon bursitis, right elbow 04/04/2022   Vertigo 04/04/2022   Orthostatic hypotension 03/21/2022   BPH (benign prostatic hyperplasia) 02/21/2022   Annual physical exam 01/22/2022   Charcot's joint of foot, right 01/22/2022   Lower extremity edema 08/08/2021   Heartburn    Atypical chest pain    Cervical radicular pain 06/11/2021   Chronic anticoagulation 03/15/2021   Peripheral neuropathy 02/22/2021   Chronic systolic heart failure (HCC) 02/22/2021  Essential (primary) hypertension 11/15/2020   Arthritis of carpometacarpal (CMC) joint of right thumb 08/07/2020   COPD (chronic obstructive pulmonary disease) (HCC) 03/21/2020   GERD (gastroesophageal reflux disease) status post Nissen fundoplication 12/03/2017   Pneumothorax, left 04/11/2017   Lumbar stenosis status post T12-S1 fusion 04/07/2017   History of total right hip arthroplasty 07/31/2015   Primary osteoarthritis of right knee 06/17/2014   Thrush of mouth and esophagus (HCC) 04/03/2014   Palpitation 03/28/2014   Hyperlipidemia    History of colonic polyps 02/09/2013   Paroxysmal atrial fibrillation (HCC) 05/11/2012   Osteoarthritis of left knee 01/10/2012   Seasonal and perennial allergic rhinitis 07/23/2010   Asthma, moderate persistent 03/06/2009    Past Surgical History:  Procedure Laterality Date    73 HOUR PH STUDY N/A 05/30/2021   Procedure: 24 HOUR PH STUDY;  Surgeon: Shellia Cleverly, DO;  Location: [facility];  Service: Gastroenterology;  Laterality: N/A;   ANTERIOR CERVICAL DECOMP/DISCECTOMY FUSION N/A 07/21/2012   Procedure: ANTERIOR CERVICAL DECOMPRESSION/DISCECTOMY FUSION 2 LEVELS;  Surgeon: Barnett Abu, MD;  Location: [facility];  Service: Neurosurgery;  Laterality: N/A;  C4-5 C5-6 Anterior cervical decompression/diskectomy/fusion   ANTERIOR LAT LUMBAR FUSION N/A 04/07/2017   Procedure: Thoracic twelve-Lumbar one Anterolateral decompression/fusion;  Surgeon: Barnett Abu, MD;  Location: [facility];  Service: Neurosurgery;  Laterality: N/A;   ARTHRODESIS FOOT WITH WEIL OSTEOTOMY Left 11/07/2022   Procedure: LEFT THIRD METATARSOPHALANGEAL CAPSULOTOMY, WEIL OSTEOTOMY;  Surgeon: Terance Hart, MD;  Location[facility] OR;  Service: Orthopedics;  Laterality: Left;  LENGTH OF SURGERY: 60 MINUTES   BACK SURGERY     x 3   CARDIAC CATHETERIZATION  11/2008   Dr. Anne Fu - 20% calcified non flow limiting left main, 50% EF apical hypokinesis   CERVICAL DISC ARTHROPLASTY N/A 04/07/2017   Procedure: Cervical six-seven Disc arthroplasty;  Surgeon: Barnett Abu, MD;  Location: [facility] OR;  Service: Neurosurgery;  Laterality: N/A;   CHEST TUBE INSERTION Left 04/11/2017   Procedure: CHEST TUBE INSERTION;  Surgeon: Kerin Perna, MD;  Location: [facility] OR;  Service: Thoracic;  Laterality: Left;   CHOLECYSTECTOMY     COLONOSCOPY  2016   CG-MAC-miralax(good)servere TICS/TA x 2   ESOPHAGEAL MANOMETRY N/A 05/30/2021   Procedure: ESOPHAGEAL MANOMETRY (EM);  Surgeon: Shellia Cleverly, DO;  Location: [facility];  Service: Gastroenterology;  Laterality: N/A;  ph impedence   ESOPHAGOGASTRODUODENOSCOPY     EYE SURGERY Bilateral 2011   steroidal encapsulation" (04/28/2012)   FOOT SURGERY Right    x 2   JOINT REPLACEMENT     Many surgeries including 5 joit replacements   KNEE ARTHROSCOPY Bilateral     LATERAL / POSTERIOR COMBINED FUSION LUMBAR SPINE  2012   LEFT ATRIAL APPENDAGE OCCLUSION N/A 02/22/2021   Procedure: LEFT ATRIAL APPENDAGE OCCLUSION;  Surgeon: Lanier Prude, MD;  [facility]: [facility];  Service: Cardiovascular;  Laterality: N/A;   POLYPECTOMY  2016   severe TICS/TA x 2   POSTERIOR CERVICAL LAMINECTOMY WITH MET- RX Right 06/11/2021   Procedure: Right Cervical six-seven  Laminectomy and foraminotomy with metrex;  Surgeon: Barnett Abu, MD;  Location: [facility] OR;  Service: Neurosurgery;  Laterality: Right;   REVERSE SHOULDER ARTHROPLASTY  04/28/2012   Procedure: REVERSE SHOULDER ARTHROPLASTY;  Surgeon: Mable Paris, MD;  Location: [facility] OR;  Service: Orthopedics;  Laterality: Left;  Left shouder reverse total shoulder arthroplasty   SHOULDER SURGERY     SPINE SURGERY     I have had 7  spinal surgeries   TEE WITHOUT CARDIOVERSION N/A 02/22/2021   Procedure: TRANSESOPHAGEAL ECHOCARDIOGRAM (TEE);  Surgeon: Lanier Prude, MD;  Location: [facility] INVASIVE CV LAB;  Service: Cardiovascular;  Laterality: N/A;   TENDON REPAIR Right 08/07/2020   Procedure: RIGHT HAND LIGAMENT RECONSTRUCTION AND TENDON INTERPOSITION;  Surgeon: Jodi Geralds, MD;  Location: [facility];  Service: Orthopedics;  Laterality: Right;   TONSILLECTOMY AND ADENOIDECTOMY  1954   TOTAL HIP ARTHROPLASTY Left 2011   "left" (04/28/2012)   TOTAL HIP ARTHROPLASTY Right 07/31/2015   Procedure: TOTAL HIP ARTHROPLASTY ANTERIOR APPROACH;  Surgeon: Jodi Geralds, MD;  Location: [facility];  Service: Orthopedics;  Laterality: Right;   TOTAL KNEE ARTHROPLASTY  01/10/2012   Procedure: TOTAL KNEE ARTHROPLASTY;  Surgeon: Harvie Junior, MD;  Location: [facility] OR;  Service: Orthopedics;;  left total knee arthroplasty   TOTAL KNEE ARTHROPLASTY Right 06/17/2014   Procedure: TOTAL KNEE ARTHROPLASTY;  Surgeon: Harvie Junior, MD;  Location: [facility];  Service: Orthopedics;  Laterality: Right;   toupet fundolplication   11/01/2021   TRANSESOPHAGEAL ECHOCARDIOGRAM (CATH LAB) N/A 03/24/2023   Procedure: TRANSESOPHAGEAL ECHOCARDIOGRAM;  Surgeon: Chrystie Nose, MD;  Location: [facility];  Service: Cardiovascular;  Laterality: N/A;       Home Medications    Prior to Admission medications   Medication Sig Start Date End Date Taking? Authorizing Provider  albuterol (VENTOLIN HFA) 108 (90 Base) MCG/ACT inhaler INHALE 2 PUFFS BY MOUTH EVERY 4 HOURS AS NEEDED FOR WHEEZE OR FOR SHORTNESS OF BREATH 01/14/23  Yes Young, Joni Fears D, MD  allopurinol (ZYLOPRIM) 100 MG tablet Take 1 tablet (100 mg total) by mouth in the morning. 11/25/22  Yes Monica Becton, MD  atorvastatin (LIPITOR) 20 MG tablet TAKE 1 TABLET BY MOUTH EVERY DAY Patient taking differently: Take 20 mg by mouth daily. 09/17/22  Yes Jake Bathe, MD  ciprofloxacin (CIPRO) 750 MG tablet Take 1 tablet (750 mg total) by mouth 2 (two) times daily. 04/23/23  Yes Ghimire, Werner Lean, MD  cyclobenzaprine (FLEXERIL) 10 MG tablet Take 1 tablet (10 mg total) by mouth at bedtime. 03/12/23  Yes Monica Becton, MD  diltiazem (CARDIZEM CD) 180 MG 24 hr capsule TAKE 1 CAPSULE BY MOUTH EVERY DAY 10/16/22  Yes Jake Bathe, MD  diltiazem (CARDIZEM) 30 MG tablet TAKE 1 TABLET (30 MG TOTAL) BY MOUTH AS NEEDED (HEART RATE OVER 120). 03/10/23  Yes Jake Bathe, MD  DULoxetine (CYMBALTA) 30 MG capsule Take 1 capsule (30 mg total) by mouth daily. 12/24/22  Yes Lomax, Amy, NP  ELIQUIS 5 MG TABS tablet TAKE 1 TABLET BY MOUTH TWICE A DAY 01/28/23  Yes Skains, Veverly Fells, MD  ipratropium-albuterol (DUONEB) 0.5-2.5 (3) MG/3ML SOLN 1 NEBULE EVERY 6 HOURS AS NEEDED. **J45.3** Patient taking differently: Take 3 mLs by nebulization every 6 (six) hours as needed (for shortness of breath and wheezing). 03/22/20  Yes Young, Joni Fears D, MD  isosorbide mononitrate (IMDUR) 30 MG 24 hr tablet TAKE 1 TABLET BY MOUTH EVERY DAY 03/10/23  Yes Jake Bathe, MD  leptospermum manuka  honey (MEDIHONEY) PSTE paste Apply 1 Application topically daily. 03/25/23  Yes Jonah Blue, MD  linezolid (ZYVOX) 600 MG tablet Take 1 tablet (600 mg total) by mouth every 12 (twelve) hours. 04/22/23  Yes Danelle Earthly, MD  meclizine (ANTIVERT) 25 MG tablet Take 1 tablet (25 mg total) by mouth 3 (three) times daily as needed for dizziness. 05/02/23  Yes Monica Becton,  MD  Nebulizers (COMPRESSOR/NEBULIZER) MISC Use as directed 12/01/17  Yes Young, Joni Fears D, MD  Olodaterol HCl (STRIVERDI RESPIMAT) 2.5 MCG/ACT AERS INHALE 2 PUFFS BY MOUTH INTO THE LUNGS DAILY Patient taking differently: Inhale 2 puffs into the lungs daily. 03/10/23  Yes Young, Joni Fears D, MD  ondansetron (ZOFRAN-ODT) 8 MG disintegrating tablet Take 1 tablet (8 mg total) by mouth every 8 (eight) hours as needed for nausea. 04/07/23  Yes Monica Becton, MD  oxyCODONE-acetaminophen (PERCOCET) 10-325 MG tablet Take 1 tablet by mouth every 8 (eight) hours as needed for pain. 04/07/23  Yes Monica Becton, MD  pantoprazole (PROTONIX) 40 MG tablet TAKE 1 TABLET (40 MG TOTAL) BY MOUTH TWICE A DAY BEFORE MEALS Patient taking differently: Take 40 mg by mouth 2 (two) times daily. 11/04/22  Yes Monica Becton, MD  predniSONE (DELTASONE) 10 MG tablet Take 1 tablet (10 mg total) by mouth in the morning and at bedtime. 10/30/22  Yes Young, Joni Fears D, MD  pregabalin (LYRICA) 100 MG capsule Take 1 capsule (100 mg total) by mouth 2 (two) times daily. 04/22/23  Yes Ghimire, Werner Lean, MD  sulfamethoxazole-trimethoprim (BACTRIM) 400-80 MG tablet Take 1 tablet by mouth daily starting 05/02/2023 05/02/23  Yes Danelle Earthly, MD  tamsulosin (FLOMAX) 0.4 MG CAPS capsule Take 0.4 mg by mouth at bedtime.   Yes [provider]    Family History Family History  Problem Relation Age of Onset   Diabetes Mother    Heart disease Father        Died 31, MI   Cancer Father    Hyperlipidemia Father    Stroke Father     Prostate cancer Brother    Colon polyps Brother    Colon polyps Brother    Asthma Brother    COPD Brother    Colon cancer Brother        Dx age 88   Cancer Brother    Colon cancer Other    Esophageal cancer Neg Hx    Rectal cancer Neg Hx    Stomach cancer Neg Hx    Pancreatic cancer Neg Hx    Kidney disease Neg Hx    Liver disease Neg Hx    Neuropathy Neg Hx     Social History Social History   Tobacco Use   Smoking status: Never   Smokeless tobacco: Never  Vaping Use   Vaping status: Never Used  Substance Use Topics   Alcohol use: No   Drug use: No     Allergies   Levaquin [levofloxacin]   Review of Systems Review of Systems See HPI  Physical Exam Triage Vital Signs ED Triage Vitals  Encounter Vitals Group     BP 05/17/23 1202 113/69     Systolic BP Percentile --      Diastolic BP Percentile --      Pulse Rate 05/17/23 1202 85     Resp 05/17/23 1202 17     Temp 05/17/23 1203 (!) 97.5 F (36.4 C)     Temp Source 05/17/23 1203 Oral     SpO2 05/17/23 1202 98 %     Weight 05/17/23 1200 193 lb (87.5 kg)     Height 05/17/23 1200 6\' 2"  (1.88 m)     Head Circumference --      Peak Flow --      Pain Score 05/17/23 1200 4     Pain Loc --      Pain Education --  Exclude from Growth Chart --    No data found.  Updated Vital Signs BP 113/69 (BP Location: Left Arm)   Pulse 85   Temp (!) 97.5 F (36.4 C) (Oral)   Resp 17   Ht 6\' 2"  (1.88 m)   Wt 87.5 kg   SpO2 98%   BMI 24.78 kg/m      Physical Exam Constitutional:      General: He is not in acute distress.    Appearance: He is well-developed.  HENT:     Head: Normocephalic and atraumatic.  Eyes:     Conjunctiva/sclera: Conjunctivae normal.     Pupils: Pupils are equal, round, and reactive to light.  Cardiovascular:     Rate and Rhythm: Normal rate.  Pulmonary:     Effort: Pulmonary effort is normal. No respiratory distress.  Abdominal:     General: There is no distension.      Palpations: Abdomen is soft.  Musculoskeletal:        General: Normal range of motion.     Cervical back: Normal range of motion.  Skin:    General: Skin is warm and dry.     Comments: Suture line is examined.  Sutures are clean dry and intact.  New dressing placed  Neurological:     Mental Status: He is alert.      UC Treatments / Results  Labs (all labs ordered are listed, but only abnormal results are displayed) Labs Reviewed - No data to display  EKG   Radiology No results found.  Procedures Procedures (including critical care time)  Medications Ordered in UC Medications - No data to display  Initial Impression / Assessment and Plan / UC Course  I have reviewed the triage vital signs and the nursing notes.  Pertinent labs & imaging results that were available during my care of the patient were reviewed by me and considered in my medical decision making (see chart for details).     Discussed wound care Final Clinical Impressions(s) / UC Diagnoses   Final diagnoses:  Visit for wound check     Discharge Instructions      Return for suture removal at 7-10 days   ED Prescriptions   None    PDMP not reviewed this encounter.   Eustace Moore, MD 05/17/23 1520

## 2023-05-17 NOTE — Discharge Instructions (Signed)
Return for suture removal at 7-10 days

## 2023-05-17 NOTE — ED Triage Notes (Signed)
Pt states that he was told to return to have the wound on his left leg rechecked.

## 2023-05-24 ENCOUNTER — Other Ambulatory Visit (HOSPITAL_COMMUNITY): Payer: Self-pay

## 2023-05-25 ENCOUNTER — Ambulatory Visit
Admission: EM | Admit: 2023-05-25 | Discharge: 2023-05-25 | Disposition: A | Discharge status: Home / Self Care | Payer: Medicare Other

## 2023-05-25 ENCOUNTER — Other Ambulatory Visit: Payer: Self-pay

## 2023-05-25 DIAGNOSIS — Z5189 Encounter for other specified aftercare: Secondary | ICD-10-CM

## 2023-05-25 NOTE — Discharge Instructions (Addendum)
Advised patient/wife to leave wound area open to air to allow healing by secondary intention/scab formation.  Encouraged to increase daily water intake.  Advised if symptoms worsen and/or unresolved please follow-up here for wound check.

## 2023-05-25 NOTE — ED Triage Notes (Signed)
[facility] presents to [facility] with co of pain and swelling in wound left leg. Pt has lac repair on 12/20 with 26 stiches placed. Pt started his bactrim back up on Friday.

## 2023-05-25 NOTE — ED Provider Notes (Signed)
[name]    CSN: [id number] Arrival date & time: 05/25/23  8657      History   Chief Complaint Chief Complaint  Patient presents with   Wound Check    HPI Levi [name] is a 77 y.o. male.   HPI Very pleasant 77 year old male presents for wound check.  Reports laceration.  On 05/16/2023 in which 26 stitches were placed.  Patient reports started taking Bactrim again on Friday.  PMH significant for CHF.  Past Medical History:  Diagnosis Date   Agent orange exposure 1970's   takes Imdur,Diltiazem, and Atacand daily   ALLERGIC RHINITIS    Allergy    seasonal allergies   Asthma    uses inhaler and nebulizers   Bruises easily    d/t prednisone daily   Cataract    Cellulitis 03/22/2020   Chest wall pain 09/21/2019   L scapula, reported 4/27 21/     CHF (congestive heart failure) (HCC)    dx-on meds to treat   Chronic bronchitis (HCC)    "used to get it q yr; last time ?2008" (04/28/2012)   Clotting disorder (HCC) 2019   On blood thinner when I bleed it is for a consideral amount of time. This is tegardless of size of wound.   COPD (chronic obstructive pulmonary disease) (HCC)    agent orange exposure   Degenerative disk disease    "everywhere" (04/28/2012)   Diverticulitis    Dysrhythmia    afib   Elevated uric acid in blood    takes Allopurinol daily   Emphysema of lung (HCC)    Enlarged prostate    but not on any meds   GERD (gastroesophageal reflux disease)    takes Protonix daily   H/O hiatal hernia    Headache    History of pneumonia    a. 2010   Hyperlipidemia    takes Lipitor daily; pt. states he takes a preventive   Hypothyroidism    Joint pain    Joint swelling    MSSA bacteremia 03/19/2023   Nausea and vomiting 11/12/2022   Neuromuscular disorder (HCC)    bilat legs, and bilat arms   Osteoporosis 2012   PAF (paroxysmal atrial fibrillation) (HCC)    a. Dx 04/2012, CHA2DS2VASc = 1 (age);  b. 04/2012 Echo: EF 40-50%, mild MR.    Personal history of colonic adenomas 02/09/2013   Pneumonia    Sepsis (HCC) 03/18/2023   Shortness of breath dyspnea    Thyroid disease    no taking meds-caused dizziness    Patient Active Problem List   Diagnosis Date Noted   Cellulitis 04/17/2023   Closed fracture of ramus of right pubis (HCC) 04/07/2023   S/P PICC central line placement 03/31/2023   Venous insufficiency of both lower extremities 03/20/2023   Chronic superficial venous thrombosis of both lower extremities 11/18/2022   Nausea and vomiting 11/12/2022   Venous stasis ulcer (HCC) 09/13/2022   Primary osteoarthritis of right shoulder, status post left shoulder arthroplasty 07/05/2022   Shingles 05/16/2022   Olecranon bursitis, right elbow 04/04/2022   Vertigo 04/04/2022   Orthostatic hypotension 03/21/2022   BPH (benign prostatic hyperplasia) 02/21/2022   Annual physical exam 01/22/2022   Charcot's joint of foot, right 01/22/2022   Lower extremity edema 08/08/2021   Heartburn    Atypical chest pain    Cervical radicular pain 06/11/2021   Chronic anticoagulation 03/15/2021   Peripheral neuropathy 02/22/2021   Chronic systolic heart failure (HCC) 02/22/2021  Essential (primary) hypertension 11/15/2020   Arthritis of carpometacarpal (CMC) joint of right thumb 08/07/2020   COPD (chronic obstructive pulmonary disease) (HCC) 03/21/2020   GERD (gastroesophageal reflux disease) status post Nissen fundoplication 12/03/2017   Pneumothorax, left 04/11/2017   Lumbar stenosis status post T12-S1 fusion 04/07/2017   History of total right hip arthroplasty 07/31/2015   Primary osteoarthritis of right knee 06/17/2014   Thrush of mouth and esophagus (HCC) 04/03/2014   Palpitation 03/28/2014   Hyperlipidemia    History of colonic polyps 02/09/2013   Paroxysmal atrial fibrillation (HCC) 05/11/2012   Osteoarthritis of left knee 01/10/2012   Seasonal and perennial allergic rhinitis 07/23/2010   Asthma, moderate persistent  03/06/2009    Past Surgical History:  Procedure Laterality Date   91 HOUR PH STUDY N/A 05/30/2021   Procedure: 24 HOUR PH STUDY;  Surgeon: Shellia Cleverly, DO;  Location: [facility];  Service: Gastroenterology;  Laterality: N/A;   ANTERIOR CERVICAL DECOMP/DISCECTOMY FUSION N/A 07/21/2012   Procedure: ANTERIOR CERVICAL DECOMPRESSION/DISCECTOMY FUSION 2 LEVELS;  Surgeon: Barnett Abu, MD;  Location: [facility];  Service: Neurosurgery;  Laterality: N/A;  C4-5 C5-6 Anterior cervical decompression/diskectomy/fusion   ANTERIOR LAT LUMBAR FUSION N/A 04/07/2017   Procedure: Thoracic twelve-Lumbar one Anterolateral decompression/fusion;  Surgeon: Barnett Abu, MD;  Location: [facility];  Service: Neurosurgery;  Laterality: N/A;   ARTHRODESIS FOOT WITH WEIL OSTEOTOMY Left 11/07/2022   Procedure: LEFT THIRD METATARSOPHALANGEAL CAPSULOTOMY, WEIL OSTEOTOMY;  Surgeon: Terance Hart, MD;  Location: [facility] OR;  Service: Orthopedics;  Laterality: Left;  LENGTH OF SURGERY: 60 MINUTES   BACK SURGERY     x 3   CARDIAC CATHETERIZATION  11/2008   Dr. Anne Fu - 20% calcified non flow limiting left main, 50% EF apical hypokinesis   CERVICAL DISC ARTHROPLASTY N/A 04/07/2017   Procedure: Cervical six-seven Disc arthroplasty;  Surgeon: Barnett Abu, MD;  Location: [facility] OR;  Service: Neurosurgery;  Laterality: N/A;   CHEST TUBE INSERTION Left 04/11/2017   Procedure: CHEST TUBE INSERTION;  Surgeon: Kerin Perna, MD;  Location: [facility] OR;  Service: Thoracic;  Laterality: Left;   CHOLECYSTECTOMY     COLONOSCOPY  2016   CG-MAC-miralax(good)servere TICS/TA x 2   ESOPHAGEAL MANOMETRY N/A 05/30/2021   Procedure: ESOPHAGEAL MANOMETRY (EM);  Surgeon: Shellia Cleverly, DO;  Location: [facility];  Service: Gastroenterology;  Laterality: N/A;  ph impedence   ESOPHAGOGASTRODUODENOSCOPY     EYE SURGERY Bilateral 2011   steroidal encapsulation" (04/28/2012)   FOOT SURGERY Right    x 2   JOINT REPLACEMENT     Many  surgeries including 5 joit replacements   KNEE ARTHROSCOPY Bilateral    LATERAL / POSTERIOR COMBINED FUSION LUMBAR SPINE  2012   LEFT ATRIAL APPENDAGE OCCLUSION N/A 02/22/2021   Procedure: LEFT ATRIAL APPENDAGE OCCLUSION;  Surgeon: Lanier Prude, MD;  Location: [facility];  Service: Cardiovascular;  Laterality: N/A;   POLYPECTOMY  2016   severe TICS/TA x 2   POSTERIOR CERVICAL LAMINECTOMY WITH MET- RX Right 06/11/2021   Procedure: Right Cervical six-seven  Laminectomy and foraminotomy with metrex;  Surgeon: Barnett Abu, MD;  Location: [facility] OR;  Service: Neurosurgery;  Laterality: Right;   REVERSE SHOULDER ARTHROPLASTY  04/28/2012   Procedure: REVERSE SHOULDER ARTHROPLASTY;  Surgeon: Mable Paris, MD;  Location: [facility] OR;  Service: Orthopedics;  Laterality: Left;  Left shouder reverse total shoulder arthroplasty   SHOULDER SURGERY     SPINE SURGERY     I have had 7  spinal surgeries   TEE WITHOUT CARDIOVERSION N/A 02/22/2021   Procedure: TRANSESOPHAGEAL ECHOCARDIOGRAM (TEE);  Surgeon: Lanier Prude, MD;  Location: [facility] INVASIVE CV LAB;  Service: Cardiovascular;  Laterality: N/A;   TENDON REPAIR Right 08/07/2020   Procedure: RIGHT HAND LIGAMENT RECONSTRUCTION AND TENDON INTERPOSITION;  Surgeon: Jodi Geralds, MD;  Location: [facility];  Service: Orthopedics;  Laterality: Right;   TONSILLECTOMY AND ADENOIDECTOMY  1954   TOTAL HIP ARTHROPLASTY Left 2011   "left" (04/28/2012)   TOTAL HIP ARTHROPLASTY Right 07/31/2015   Procedure: TOTAL HIP ARTHROPLASTY ANTERIOR APPROACH;  Surgeon: Jodi Geralds, MD;  Location: [facility];  Service: Orthopedics;  Laterality: Right;   TOTAL KNEE ARTHROPLASTY  01/10/2012   Procedure: TOTAL KNEE ARTHROPLASTY;  Surgeon: Harvie Junior, MD;  Location: [facility] OR;  Service: Orthopedics;;  left total knee arthroplasty   TOTAL KNEE ARTHROPLASTY Right 06/17/2014   Procedure: TOTAL KNEE ARTHROPLASTY;  Surgeon: Harvie Junior, MD;  Location: [facility];   Service: Orthopedics;  Laterality: Right;   toupet fundolplication  11/01/2021   TRANSESOPHAGEAL ECHOCARDIOGRAM (CATH LAB) N/A 03/24/2023   Procedure: TRANSESOPHAGEAL ECHOCARDIOGRAM;  Surgeon: Chrystie Nose, MD;  Location: [facility];  Service: Cardiovascular;  Laterality: N/A;       Home Medications    Prior to Admission medications   Medication Sig Start Date End Date Taking? Authorizing Provider  albuterol (VENTOLIN HFA) 108 (90 Base) MCG/ACT inhaler INHALE 2 PUFFS BY MOUTH EVERY 4 HOURS AS NEEDED FOR WHEEZE OR FOR SHORTNESS OF BREATH 01/14/23   Jetty Duhamel D, MD  allopurinol (ZYLOPRIM) 100 MG tablet Take 1 tablet (100 mg total) by mouth in the morning. 11/25/22   Monica Becton, MD  atorvastatin (LIPITOR) 20 MG tablet TAKE 1 TABLET BY MOUTH EVERY DAY Patient taking differently: Take 20 mg by mouth daily. 09/17/22   Jake Bathe, MD  ciprofloxacin (CIPRO) 750 MG tablet Take 1 tablet (750 mg total) by mouth 2 (two) times daily. 04/23/23   Ghimire, Werner Lean, MD  cyclobenzaprine (FLEXERIL) 10 MG tablet Take 1 tablet (10 mg total) by mouth at bedtime. 03/12/23   Monica Becton, MD  diltiazem (CARDIZEM CD) 180 MG 24 hr capsule TAKE 1 CAPSULE BY MOUTH EVERY DAY 10/16/22   Jake Bathe, MD  diltiazem (CARDIZEM) 30 MG tablet TAKE 1 TABLET (30 MG TOTAL) BY MOUTH AS NEEDED (HEART RATE OVER 120). 03/10/23   Jake Bathe, MD  DULoxetine (CYMBALTA) 30 MG capsule Take 1 capsule (30 mg total) by mouth daily. 12/24/22   Lomax, Amy, NP  ELIQUIS 5 MG TABS tablet TAKE 1 TABLET BY MOUTH TWICE A DAY 01/28/23   Jake Bathe, MD  ipratropium-albuterol (DUONEB) 0.5-2.5 (3) MG/3ML SOLN 1 NEBULE EVERY 6 HOURS AS NEEDED. **J45.3** Patient taking differently: Take 3 mLs by nebulization every 6 (six) hours as needed (for shortness of breath and wheezing). 03/22/20   Jetty Duhamel D, MD  isosorbide mononitrate (IMDUR) 30 MG 24 hr tablet TAKE 1 TABLET BY MOUTH EVERY DAY 03/10/23    Jake Bathe, MD  leptospermum manuka honey (MEDIHONEY) PSTE paste Apply 1 Application topically daily. 03/25/23   Jonah Blue, MD  linezolid (ZYVOX) 600 MG tablet Take 1 tablet (600 mg total) by mouth every 12 (twelve) hours. 04/22/23   Danelle Earthly, MD  meclizine (ANTIVERT) 25 MG tablet Take 1 tablet (25 mg total) by mouth 3 (three) times daily as needed for dizziness. 05/02/23   Monica Becton,  MD  Nebulizers (COMPRESSOR/NEBULIZER) MISC Use as directed 12/01/17   Waymon Budge, MD  Olodaterol HCl (STRIVERDI RESPIMAT) 2.5 MCG/ACT AERS INHALE 2 PUFFS BY MOUTH INTO THE LUNGS DAILY Patient taking differently: Inhale 2 puffs into the lungs daily. 03/10/23   Waymon Budge, MD  ondansetron (ZOFRAN-ODT) 8 MG disintegrating tablet Take 1 tablet (8 mg total) by mouth every 8 (eight) hours as needed for nausea. 04/07/23   Monica Becton, MD  oxyCODONE-acetaminophen (PERCOCET) 10-325 MG tablet Take 1 tablet by mouth every 8 (eight) hours as needed for pain. 04/07/23   Monica Becton, MD  pantoprazole (PROTONIX) 40 MG tablet TAKE 1 TABLET (40 MG TOTAL) BY MOUTH TWICE A DAY BEFORE MEALS Patient taking differently: Take 40 mg by mouth 2 (two) times daily. 11/04/22   Monica Becton, MD  predniSONE (DELTASONE) 10 MG tablet Take 1 tablet (10 mg total) by mouth in the morning and at bedtime. 10/30/22   Jetty Duhamel D, MD  pregabalin (LYRICA) 100 MG capsule Take 1 capsule (100 mg total) by mouth 2 (two) times daily. 04/22/23   Ghimire, Werner Lean, MD  sulfamethoxazole-trimethoprim (BACTRIM) 400-80 MG tablet Take 1 tablet by mouth daily starting 05/02/2023 05/02/23   Danelle Earthly, MD  tamsulosin (FLOMAX) 0.4 MG CAPS capsule Take 0.4 mg by mouth at bedtime.    [provider]    Family History Family History  Problem Relation Age of Onset   Diabetes Mother    Heart disease Father        Died 66, MI   Cancer Father    Hyperlipidemia Father    Stroke Father     Prostate cancer Brother    Colon polyps Brother    Colon polyps Brother    Asthma Brother    COPD Brother    Colon cancer Brother        Dx age 55   Cancer Brother    Colon cancer Other    Esophageal cancer Neg Hx    Rectal cancer Neg Hx    Stomach cancer Neg Hx    Pancreatic cancer Neg Hx    Kidney disease Neg Hx    Liver disease Neg Hx    Neuropathy Neg Hx     Social History Social History   Tobacco Use   Smoking status: Never   Smokeless tobacco: Never  Vaping Use   Vaping status: Never Used  Substance Use Topics   Alcohol use: No   Drug use: No     Allergies   Levaquin [levofloxacin]   Review of Systems Review of Systems   Physical Exam Triage Vital Signs ED Triage Vitals [05/25/23 1051]  Encounter Vitals Group     BP 99/67     Systolic BP Percentile      Diastolic BP Percentile      Pulse Rate (!) 103     Resp 20     Temp (!) 97.4 F (36.3 C)     Temp src      SpO2 98 %     Weight      Height      Head Circumference      Peak Flow      Pain Score 5     Pain Loc      Pain Education      Exclude from Growth Chart    No data found.  Updated Vital Signs BP 99/67   Pulse (!) 103   Temp (!) 97.4  F (36.3 C)   Resp 20   SpO2 98%   Visual Acuity Right Eye Distance:   Left Eye Distance:   Bilateral Distance:    Right Eye Near:   Left Eye Near:    Bilateral Near:     Physical Exam Vitals and nursing note reviewed.  Constitutional:      Appearance: Normal appearance. He is normal weight.  HENT:     Head: Normocephalic and atraumatic.     Mouth/Throat:     Mouth: Mucous membranes are moist.     Pharynx: Oropharynx is clear.  Eyes:     Extraocular Movements: Extraocular movements intact.     Conjunctiva/sclera: Conjunctivae normal.     Pupils: Pupils are equal, round, and reactive to light.  Cardiovascular:     Rate and Rhythm: Normal rate and regular rhythm.     Pulses: Normal pulses.     Heart sounds: Normal heart sounds.   Pulmonary:     Effort: Pulmonary effort is normal.     Breath sounds: Normal breath sounds. No wheezing, rhonchi or rales.  Musculoskeletal:        General: Normal range of motion.     Cervical back: Normal range of motion and neck supple.  Skin:    General: Skin is warm and dry.     Comments: Left lower leg (posterior aspect): Well-healing suture line with skin edges everted and approximated, sutures intact mild erythema noted please see images below, mild serous drainage noted once Xeroform removed advised patient to leave wound area open to air and allow healing by secondary intention.  Neurological:     General: No focal deficit present.     Mental Status: He is alert and oriented to person, place, and time. Mental status is at baseline.  Psychiatric:        Mood and Affect: Mood normal.        Behavior: Behavior normal.         UC Treatments / Results  Labs (all labs ordered are listed, but only abnormal results are displayed) Labs Reviewed - No data to display  EKG   Radiology No results found.  Procedures Procedures (including critical care time)  Medications Ordered in UC Medications - No data to display  Initial Impression / Assessment and Plan / UC Course  I have reviewed the triage vital signs and the nursing notes.  Pertinent labs & imaging results that were available during my care of the patient were reviewed by me and considered in my medical decision making (see chart for details).     MDM: 1.  Visit for wound check-Advised patient/wife to leave wound area open to air to allow healing by secondary intention/scab formation.  Encouraged to increase daily water intake.  Advised if symptoms worsen and/or unresolved please follow-up here for wound check.  Final Clinical Impressions(s) / UC Diagnoses   Final diagnoses:  Visit for wound check     Discharge Instructions      Advised patient/wife to leave wound area open to air to allow healing by  secondary intention/scab formation.  Encouraged to increase daily water intake.  Advised if symptoms worsen and/or unresolved please follow-up here for wound check.     ED Prescriptions   None    PDMP not reviewed this encounter.   Trevor Iha, FNP 05/25/23 1208

## 2023-05-27 ENCOUNTER — Ambulatory Visit (INDEPENDENT_AMBULATORY_CARE_PROVIDER_SITE_OTHER): Payer: Medicare Other | Admitting: Sports Medicine

## 2023-05-27 ENCOUNTER — Encounter: Payer: Self-pay | Admitting: Sports Medicine

## 2023-05-27 VITALS — BP 118/64 | HR 90 | Temp 96.9°F | Resp 17 | Ht 74.0 in | Wt 205.6 lb

## 2023-05-27 DIAGNOSIS — I5022 Chronic systolic (congestive) heart failure: Secondary | ICD-10-CM | POA: Diagnosis not present

## 2023-05-27 DIAGNOSIS — M7989 Other specified soft tissue disorders: Secondary | ICD-10-CM

## 2023-05-27 DIAGNOSIS — S81812S Laceration without foreign body, left lower leg, sequela: Secondary | ICD-10-CM | POA: Diagnosis not present

## 2023-05-27 DIAGNOSIS — N401 Enlarged prostate with lower urinary tract symptoms: Secondary | ICD-10-CM

## 2023-05-27 DIAGNOSIS — L03116 Cellulitis of left lower limb: Secondary | ICD-10-CM

## 2023-05-27 DIAGNOSIS — E7800 Pure hypercholesterolemia, unspecified: Secondary | ICD-10-CM | POA: Diagnosis not present

## 2023-05-27 DIAGNOSIS — J438 Other emphysema: Secondary | ICD-10-CM | POA: Diagnosis not present

## 2023-05-27 DIAGNOSIS — I480 Paroxysmal atrial fibrillation: Secondary | ICD-10-CM | POA: Diagnosis not present

## 2023-05-27 MED ORDER — AMOXICILLIN-POT CLAVULANATE 875-125 MG PO TABS: 1.0000 | ORAL_TABLET | Freq: Two times a day (BID) | ORAL | 0 refills | Status: DC

## 2023-05-27 MED ORDER — FUROSEMIDE 40 MG PO TABS: 40.0000 mg | ORAL_TABLET | Freq: Every day | ORAL | 3 refills | Status: DC

## 2023-05-27 NOTE — Progress Notes (Addendum)
 Careteam: Patient Care Team: Curtis Debby PARAS, MD as PCP - General (Family Medicine) Jeffrie Oneil BROCKS, MD as PCP - Cardiology (Cardiology) Cindie Ole DASEN, MD as PCP - Electrophysiology (Cardiology)  PLACE OF SERVICE:  [facility]  Advanced Directive information Does Patient Have a Medical Advance Directive?: Yes, Type of Advance Directive: Healthcare Power of [place];Living will;Out of facility DNR (pink MOST or yellow form), Does patient want to make changes to medical advance directive?: No - Patient declined  Allergies  Allergen Reactions   Levaquin  [Levofloxacin ] Itching    Tolerated ciprofloxacin  in 2022.    Chief Complaint  Patient presents with   Establish Care    New patient.    Discussed the use of AI scribe software for clinical note transcription with the patient, who gave verbal consent to proceed.  History of Present Illness   The patient, with a history of chronic obstructive pulmonary disease (COPD), gout, and atrial fibrillation, presents with a persistent leg wound following a recent hospitalization for sepsis. The wound, a large laceration which was  repaired in th ED on 05/16/23 and  has been oozing consistently since its inception.  The patient reports intermittent low-grade fevers and increased pain in the calf area surrounding the wound. The patient's legs are noted to be chronically swollen, a condition that has been managed with Lasix , although not consistently taken recently. Pt went to emergency room recently and advice to leave it uncovered. Despite this, the wound continues to ooze and has not shown significant signs of healing. The patient's white blood cell count has been fluctuating, with recent increases noted. The patient has been on antibiotics since his hospital stay, including a course of Bactrim  double strength followed by a switch to single strength. The patient also took linezolid  and Cipro  during his hospital stay. The patient also reports  occasional chills but denies any significant changes in breathing related to his COPD.  The patient's COPD has been stable for the past three years and is managed with Struverdi Respimat and rescue inhalers, used a couple of times per week. Pt is on chronic prednisone  and single strength Bactrim  for prophylaxis due to long-term prednisone  use.  The patient's atrial fibrillation is managed with Cardizem , with an additional dose taken when the heart rate exceeds 120.  . The patient's gout is managed with allopurinol .   a single strength Bactrim  for prophylaxis due to long-term prednisone  use.         Review of Systems:  Review of Systems  Constitutional:  Negative for chills and fever.  HENT:  Negative for congestion and sore throat.   Eyes:  Negative for double vision.  Respiratory:  Negative for cough, sputum production and shortness of breath.   Cardiovascular:  Negative for chest pain, palpitations and leg swelling.  Gastrointestinal:  Negative for abdominal pain, heartburn and nausea.  Genitourinary:  Negative for dysuria, frequency and hematuria.  Neurological:  Negative for dizziness.   Negative unless indicated in HPI.   Past Medical History:  Diagnosis Date   Agent orange exposure 1970's   takes Imdur ,Diltiazem , and Atacand  daily   Agent orange exposure    ALLERGIC RHINITIS    Allergy     seasonal allergies   Asthma    uses inhaler and nebulizers   Atrial fibrillation (HCC)    Bruises easily    d/t prednisone  daily   Cataract    Cellulitis 03/22/2020   Chest wall pain 09/21/2019   L scapula, reported 4/27 21/  CHF (congestive heart failure) (HCC)    dx-on meds to treat   Chronic bronchitis (HCC)    used to get it q yr; last time ?2008 (04/28/2012)   Clotting disorder (HCC) 2019   On blood thinner when I bleed it is for a consideral amount of time. This is tegardless of size of wound.   COPD (chronic obstructive pulmonary disease) (HCC)    agent orange exposure    Degenerative disk disease    everywhere (04/28/2012)   Diverticulitis    Dysrhythmia    afib   Elevated uric acid in blood    takes Allopurinol  daily   Emphysema of lung (HCC)    Enlarged prostate    but not on any meds   GERD (gastroesophageal reflux disease)    takes Protonix  daily   H/O hiatal hernia    Headache    History of colonoscopy    History of pneumonia    a. 2010   Hyperlipidemia    takes Lipitor daily; pt. states he takes a preventive   Hypothyroidism    Joint pain    Joint swelling    Leukocytosis    MSSA bacteremia 03/19/2023   Nausea and vomiting 11/12/2022   Neuromuscular disorder (HCC)    bilat legs, and bilat arms   Neuropathy    Osteoporosis 2012   PAF (paroxysmal atrial fibrillation) (HCC)    a. Dx 04/2012, CHA2DS2VASc = 1 (age);  b. 04/2012 Echo: EF 40-50%, mild MR.   Personal history of colonic adenomas 02/09/2013   Pneumonia    Sepsis (HCC) 03/18/2023   Shortness of breath dyspnea    Thyroid  disease    no taking meds-caused dizziness   Past Surgical History:  Procedure Laterality Date   40 HOUR PH STUDY N/A 05/30/2021   Procedure: 24 HOUR PH STUDY;  Surgeon: San Sandor GAILS, DO;  Location: [facility];  Service: Gastroenterology;  Laterality: N/A;   ANTERIOR CERVICAL DECOMP/DISCECTOMY FUSION N/A 07/21/2012   Procedure: ANTERIOR CERVICAL DECOMPRESSION/DISCECTOMY FUSION 2 LEVELS;  Surgeon: Victory Gens, MD;  Location: [facility];  Service: Neurosurgery;  Laterality: N/A;  C4-5 C5-6 Anterior cervical decompression/diskectomy/fusion   ANTERIOR LAT LUMBAR FUSION N/A 04/07/2017   Procedure: Thoracic twelve-Lumbar one Anterolateral decompression/fusion;  Surgeon: Gens Victory, MD;  Location: [facility] OR;  Service: Neurosurgery;  Laterality: N/A;   ARTHRODESIS FOOT WITH WEIL OSTEOTOMY Left 11/07/2022   Procedure: LEFT THIRD METATARSOPHALANGEAL CAPSULOTOMY, WEIL OSTEOTOMY;  Surgeon: Elsa Lonni SAUNDERS, MD;  Location: [facility] OR;  Service: Orthopedics;   Laterality: Left;  LENGTH OF SURGERY: 60 MINUTES   BACK SURGERY     x 3   CARDIAC CATHETERIZATION  11/2008   Dr. Jeffrie - 20% calcified non flow limiting left main, 50% EF apical hypokinesis   CERVICAL DISC ARTHROPLASTY N/A 04/07/2017   Procedure: Cervical six-seven Disc arthroplasty;  Surgeon: Gens Victory, MD;  Location: [facility] OR;  Service: Neurosurgery;  Laterality: N/A;   CHEST TUBE INSERTION Left 04/11/2017   Procedure: CHEST TUBE INSERTION;  Surgeon: Fleeta Hanford Coy, MD;  Location: [facility] OR;  Service: Thoracic;  Laterality: Left;   CHOLECYSTECTOMY     COLONOSCOPY  2016   CG-MAC-miralax (good)servere TICS/TA x 2   ESOPHAGEAL MANOMETRY N/A 05/30/2021   Procedure: ESOPHAGEAL MANOMETRY (EM);  Surgeon: San Sandor GAILS, DO;  Location: [facility];  Service: Gastroenterology;  Laterality: N/A;  ph impedence   ESOPHAGOGASTRODUODENOSCOPY     EYE SURGERY Bilateral 2011   steroidal encapsulation (04/28/2012)   FOOT SURGERY Right  x 2   JOINT REPLACEMENT     Many surgeries including 5 joit replacements   KNEE ARTHROSCOPY Bilateral    LATERAL / POSTERIOR COMBINED FUSION LUMBAR SPINE  2012   LEFT ATRIAL APPENDAGE OCCLUSION N/A 02/22/2021   Procedure: LEFT ATRIAL APPENDAGE OCCLUSION;  Surgeon: Cindie Ole DASEN, MD;  [facility]: [facility] INVAS[facility] CV LAB;  Service: Cardiovascular;  Laterality: N/A;   POLYPECTOMY  2016   severe TICS/TA x 2   POSTERIOR CERVICAL LAMINECTOMY WITH MET- RX Right 06/11/2021   Procedure: Right Cervical six-seven  Laminectomy and foraminotomy with metrex;  Surgeon: Colon Shove, MD;  Location: [facility] OR;  Service: Neurosurgery;  Laterality: Right;   REVERSE SHOULDER ARTHROPLASTY  04/28/2012   Procedure: REVERSE SHOULDER ARTHROPLASTY;  Surgeon: Eva Elsie Herring, MD;  Location: [facility] OR;  Service: Orthopedics;  Laterality: Left;  Left shouder reverse total shoulder arthroplasty   SHOULDER SURGERY     SPINE SURGERY     I have had 7 spinal surgeries   TEE WITHOUT CARDIOVERSION  N/A 02/22/2021   Procedure: TRANSESOPHAGEAL ECHOCARDIOGRAM (TEE);  Surgeon: Cindie Ole DASEN, MD;  Location: [facility] INVASIVE CV LAB;  Service: Cardiovascular;  Laterality: N/A;   TENDON REPAIR Right 08/07/2020   Procedure: RIGHT HAND LIGAMENT RECONSTRUCTION AND TENDON INTERPOSITION;  Surgeon: Yvone Rush, MD;  Location:  SURGERY CENTER;  Service: Orthopedics;  Laterality: Right;   TONSILLECTOMY AND ADENOIDECTOMY  1954   TOTAL HIP ARTHROPLASTY Left 2011   left (04/28/2012)   TOTAL HIP ARTHROPLASTY Right 07/31/2015   Procedure: TOTAL HIP ARTHROPLASTY ANTERIOR APPROACH;  Surgeon: Rush Yvone, MD;  [facility] OR;  Service: Orthopedics;  Laterality: Right;   TOTAL KNEE ARTHROPLASTY  01/10/2012   Procedure: TOTAL KNEE ARTHROPLASTY;  Surgeon: Rush LITTIE Yvone, MD;  Location: [facility] OR;  Service: Orthopedics;;  left total knee arthroplasty   TOTAL KNEE ARTHROPLASTY Right 06/17/2014   Procedure: TOTAL KNEE ARTHROPLASTY;  Surgeon: Rush LITTIE Yvone, MD;  Location: [facility] OR;  Service: Orthopedics;  Laterality: Right;   toupet fundolplication  11/01/2021   TRANSESOPHAGEAL ECHOCARDIOGRAM (CATH LAB) N/A 03/24/2023   Procedure: TRANSESOPHAGEAL ECHOCARDIOGRAM;  Surgeon: Mona Vinie BROCKS, MD;  [facility]: [facility] INVAS[facility] CV LAB;  Service: Cardiovascular;  Laterality: N/A;   Social History:   reports that he has never smoked. He has never used smokeless tobacco. He reports that he does not drink alcohol and does not use drugs.  Family History  Problem Relation Age of Onset   Diabetes Mother    Heart disease Father        Died 45, MI   Cancer Father    Hyperlipidemia Father    Stroke Father    Prostate cancer Brother    Colon polyps Brother    Colon polyps Brother    Asthma Brother    COPD Brother    Colon cancer Brother        Dx age 65   Cancer Brother    Colon cancer Other    Esophageal cancer Neg Hx    Rectal cancer Neg Hx    Stomach cancer Neg Hx    Pancreatic cancer Neg Hx    Kidney disease Neg  Hx    Liver disease Neg Hx    Neuropathy Neg Hx     Medications: Patient's Medications  New Prescriptions   No medications on file  Previous Medications   ALBUTEROL  (VENTOLIN  HFA) 108 (90 BASE) MCG/ACT INHALER    INHALE 2 PUFFS BY MOUTH EVERY 4 HOURS AS NEEDED FOR  WHEEZE OR FOR SHORTNESS OF BREATH   ALLOPURINOL  (ZYLOPRIM ) 100 MG TABLET    Take 1 tablet (100 mg total) by mouth in the morning.   ATORVASTATIN  (LIPITOR) 20 MG TABLET    TAKE 1 TABLET BY MOUTH EVERY DAY   CYCLOBENZAPRINE  (FLEXERIL ) 10 MG TABLET    Take 10 mg by mouth as needed for muscle spasms.   DILTIAZEM  (CARDIZEM  CD) 180 MG 24 HR CAPSULE    TAKE 1 CAPSULE BY MOUTH EVERY DAY   DILTIAZEM  (CARDIZEM ) 30 MG TABLET    TAKE 1 TABLET (30 MG TOTAL) BY MOUTH AS NEEDED (HEART RATE OVER 120).   DULOXETINE  (CYMBALTA ) 30 MG CAPSULE    Take 1 capsule (30 mg total) by mouth daily.   ELIQUIS  5 MG TABS TABLET    TAKE 1 TABLET BY MOUTH TWICE A DAY   IPRATROPIUM-ALBUTEROL  (DUONEB) 0.5-2.5 (3) MG/3ML SOLN    1 NEBULE EVERY 6 HOURS AS NEEDED. **J45.3**   ISOSORBIDE  MONONITRATE (IMDUR ) 30 MG 24 HR TABLET    TAKE 1 TABLET BY MOUTH EVERY DAY   MECLIZINE  (ANTIVERT ) 25 MG TABLET    Take 1 tablet (25 mg total) by mouth 3 (three) times daily as needed for dizziness.   NEBULIZERS (COMPRESSOR/NEBULIZER) MISC    Use as directed   OLODATEROL HCL (STRIVERDI RESPIMAT) 2.5 MCG/ACT AERS    INHALE 2 PUFFS BY MOUTH INTO THE LUNGS DAILY   ONDANSETRON  (ZOFRAN -ODT) 8 MG DISINTEGRATING TABLET    Take 1 tablet (8 mg total) by mouth every 8 (eight) hours as needed for nausea.   OXYCODONE -ACETAMINOPHEN  (PERCOCET) 10-325 MG TABLET    Take 1 tablet by mouth every 8 (eight) hours as needed for pain.   PANTOPRAZOLE  (PROTONIX ) 40 MG TABLET    TAKE 1 TABLET (40 MG TOTAL) BY MOUTH TWICE A DAY BEFORE MEALS   PREDNISONE  (DELTASONE ) 10 MG TABLET    Take 1 tablet (10 mg total) by mouth in the morning and at bedtime.   PREGABALIN  (LYRICA ) 100 MG CAPSULE    Take 1 capsule (100 mg  total) by mouth 2 (two) times daily.   SULFAMETHOXAZOLE -TRIMETHOPRIM  (BACTRIM ) 400-80 MG TABLET    Take 1 tablet by mouth daily starting 05/02/2023   TAMSULOSIN  (FLOMAX ) 0.4 MG CAPS CAPSULE    Take 0.4 mg by mouth at bedtime.  Modified Medications   No medications on file  Discontinued Medications   CIPROFLOXACIN  (CIPRO ) 750 MG TABLET    Take 1 tablet (750 mg total) by mouth 2 (two) times daily.   CYCLOBENZAPRINE  (FLEXERIL ) 10 MG TABLET    Take 1 tablet (10 mg total) by mouth at bedtime.   LEPTOSPERMUM MANUKA HONEY (MEDIHONEY) PSTE PASTE    Apply 1 Application topically daily.   LINEZOLID  (ZYVOX ) 600 MG TABLET    Take 1 tablet (600 mg total) by mouth every 12 (twelve) hours.    Physical Exam: Vitals:   05/27/23 1324  BP: 118/64  Pulse: 90  Resp: 17  Temp: (!) 96.9 F (36.1 C)  SpO2: 94%  Weight: 205 lb 9.6 oz (93.3 kg)  Height: 6' 2 (1.88 m)   Body mass index is 26.4 kg/m. BP Readings from Last 3 Encounters:  05/27/23 118/64  05/25/23 99/67  05/17/23 113/69   Wt Readings from Last 3 Encounters:  05/27/23 205 lb 9.6 oz (93.3 kg)  05/17/23 193 lb (87.5 kg)  05/07/23 203 lb (92.1 kg)    Physical Exam Constitutional:      Appearance: Normal appearance.  HENT:  Head: Normocephalic and atraumatic.  Cardiovascular:     Rate and Rhythm: Normal rate. Rhythm irregular.     Pulses: Normal pulses.     Heart sounds: Normal heart sounds.  Pulmonary:     Effort: No respiratory distress.     Breath sounds: No stridor. No wheezing or rales.  Abdominal:     General: Bowel sounds are normal. There is no distension.     Palpations: Abdomen is soft.     Tenderness: There is no abdominal tenderness. There is no right CVA tenderness or guarding.  Musculoskeletal:        General: No swelling.  Skin:    Comments: Left leg - laceration covered with sutures, gaping noted with yellowish drainage , surrounding erythema, indurated skin and tenderness on palpation   Neurological:      Mental Status: He is alert. Mental status is at baseline.     Sensory: No sensory deficit.     Motor: No weakness.     Labs reviewed: Basic Metabolic Panel: Recent Labs    07/05/22 0908 11/07/22 1038 04/22/23 0358 04/23/23 0441 05/02/23 0931  NA 140   < > 131* 132* 137  K 4.4   < > 4.4 3.8 4.7  CL 104   < > 95* 99 100  CO2 25   < > 26 21* 22  GLUCOSE 113*   < > 108* 129* 80  BUN 31*   < > 12 12 19   CREATININE 1.01   < > 0.74 0.90 1.13  CALCIUM  10.0   < > 8.6* 8.6* 9.3  TSH 1.70  --   --   --   --    < > = values in this interval not displayed.   Liver Function Tests: Recent Labs    04/17/23 0909 04/17/23 1228 04/18/23 0416  AST 20 21 16   ALT 14 17 14   ALKPHOS 116 83 69  BILITOT 0.7 1.1 1.1  PROT 5.9* 5.8* 5.0*  ALBUMIN 4.0 3.1* 2.6*   Recent Labs    11/12/22 0920  LIPASE 10   No results for input(s): AMMONIA in the last 8760 hours. CBC: Recent Labs    03/24/23 0600 03/31/23 1442 04/17/23 0909 04/17/23 1228 04/18/23 0416 04/20/23 0343 04/22/23 0358 05/02/23 0931  WBC 10.0   < > 21.0* 19.8*   < > 7.8 6.8 15.5*  NEUTROABS 6.6  --  17.6* 16.1*  --   --   --   --   HGB 10.4*   < > 12.5* 11.7*   < > 10.3* 10.7* 11.3*  HCT 31.7*   < > 38.2 37.4*   < > 32.4* 32.8* 35.0*  MCV 92.4   < > 94 96.6   < > 93.4 93.2 93  PLT 210   < > 202 179   < > 149* 179 266   < > = values in this interval not displayed.   Lipid Panel: Recent Labs    07/05/22 0908  CHOL 208*  HDL 112  LDLCALC 82  TRIG 65  CHOLHDL 1.9   TSH: Recent Labs    07/05/22 0908  TSH 1.70   A1C: Lab Results  Component Value Date   HGBA1C 5.8 (H) 07/05/2022     Assessment/Plan  Leg Wound Infection Persistent oozing and redness despite ongoing Bactrim  therapy. Intermittent low-grade fevers. Elevated WBC count. Chronic venous stasis and edema likely contributing to poor wound healing. -Order blood tests to monitor WBC count. -Order leg ultrasound to  rule out deep vein  thrombosis. -Prescribe Amoxiclav for 10 days. -Advise patient to continue taking Lasix  40mg  daily to manage edema. -Advise patient to limit salt intake and elevate legs to help reduce swelling. -If symptoms worsen (increased redness, warmth, pain, or oozing), patient should go to the emergency room for possible IV antibiotics. -Follow-up in 3 weeks.  Chronic Obstructive Pulmonary Disease (COPD) Stable for the last three years. Using Striverdi Respimat and rescue inhalers as needed. -Continue current management. -Encourage patient to use rescue inhalers as needed   Atrial Fibrillation Managed with Cardizem  180mg  daily, with additional 30mg  as needed when heart rate exceeds 120 bpm. -Continue current management.  Chronic Heart Failure Managed with Indol. -Continue current management.  Gout Managed with Allopurinol . -Continue current management.  Hyperlipidemia Managed with Lipitor 20mg  daily. -Continue current management.  Acid Reflux Managed with Protonix . -Continue current management.  Neuropathic Pain Managed with Cymbalta  and Lyrica . -Continue current management.  Chronic Steroid Use Prednisone  20mg  twice daily for lung disease. -Continue current management. -Continue prophylactic Bactrim  due to chronic steroid use.  Benign Prostatic Hyperplasia Managed with Flomax . -Continue current management.  total time spent for obtaining history,  performing a medically appropriate examination and evaluation, reviewing the tests,ordering  tests,  documenting clinical information in the electronic or other health record, independently interpreting results ,care coordination (not separately reported)

## 2023-05-28 LAB — BASIC METABOLIC PANEL WITH GFR
BUN: 23 mg/dL (ref 7–25)
CO2: 26 mmol/L (ref 20–32)
Calcium: 8.9 mg/dL (ref 8.6–10.3)
Chloride: 106 mmol/L (ref 98–110)
Creat: 0.85 mg/dL (ref 0.70–1.28)
Glucose, Bld: 99 mg/dL (ref 65–139)
Potassium: 4.3 mmol/L (ref 3.5–5.3)
Sodium: 141 mmol/L (ref 135–146)
eGFR: 90 mL/min/{1.73_m2} (ref >=60)

## 2023-05-28 LAB — CBC WITH DIFFERENTIAL/PLATELET
Absolute Lymphocytes: 2093 {cells}/uL (ref 850–3900)
Absolute Monocytes: 691 {cells}/uL (ref 200–950)
Basophils Absolute: 67 {cells}/uL (ref 0–200)
Basophils Relative: 0.7 %
Eosinophils Absolute: 19 {cells}/uL (ref 15–500)
Eosinophils Relative: 0.2 %
HCT: 31.7 % — ABNORMAL LOW (ref 38.5–50.0)
Hemoglobin: 10.2 g/dL — ABNORMAL LOW (ref 13.2–17.1)
MCH: 29.7 pg (ref 27.0–33.0)
MCHC: 32.2 g/dL (ref 32.0–36.0)
MCV: 92.2 fL (ref 80.0–100.0)
MPV: 12 fL (ref 7.5–12.5)
Monocytes Relative: 7.2 %
Neutro Abs: 6730 {cells}/uL (ref 1500–7800)
Neutrophils Relative %: 70.1 %
Platelets: 199 10*3/uL (ref 140–400)
RBC: 3.44 10*6/uL — ABNORMAL LOW (ref 4.20–5.80)
RDW: 16 % — ABNORMAL HIGH (ref 11.0–15.0)
Total Lymphocyte: 21.8 %
WBC: 9.6 10*3/uL (ref 3.8–10.8)

## 2023-05-29 ENCOUNTER — Ambulatory Visit
Admission: RE | Admit: 2023-05-29 | Discharge: 2023-05-29 | Disposition: A | Discharge status: Home / Self Care | Payer: Medicare Other | Source: Ambulatory Visit | Attending: Sports Medicine | Admitting: Sports Medicine

## 2023-05-29 ENCOUNTER — Other Ambulatory Visit: Payer: Medicare Other

## 2023-05-29 DIAGNOSIS — I82812 Embolism and thrombosis of superficial veins of left lower extremities: Secondary | ICD-10-CM | POA: Diagnosis not present

## 2023-05-29 DIAGNOSIS — M7989 Other specified soft tissue disorders: Secondary | ICD-10-CM

## 2023-05-29 DIAGNOSIS — L03116 Cellulitis of left lower limb: Secondary | ICD-10-CM

## 2023-05-30 ENCOUNTER — Encounter: Payer: Self-pay | Admitting: Sports Medicine

## 2023-05-30 ENCOUNTER — Telehealth: Payer: Self-pay | Admitting: Sports Medicine

## 2023-05-30 ENCOUNTER — Ambulatory Visit (INDEPENDENT_AMBULATORY_CARE_PROVIDER_SITE_OTHER): Payer: Medicare Other | Admitting: Sports Medicine

## 2023-05-30 VITALS — BP 97/65 | HR 101 | Ht 74.0 in | Wt 205.0 lb

## 2023-05-30 DIAGNOSIS — I872 Venous insufficiency (chronic) (peripheral): Secondary | ICD-10-CM | POA: Diagnosis not present

## 2023-05-30 NOTE — Progress Notes (Signed)
    Procedures performed today:    Unna boot placed left lower extremity  Independent interpretation of notes and tests performed by another provider:   None.  Brief History, Exam, Impression, and Recommendations:    Venous insufficiency of both lower extremities Now with a large laceration left posterior leg and calf. Stitch in urgent care, it is completely broken down, I removed the stitches. He is currently on antibiotics. I applied an Radio broadcast assistant and emphasized the importance of compression here. He will return here weekly for Unna boot changes and a nurse visit and I would also like him to touch base with the wound care center, return to see me in 4 weeks.    ____________________________________________ Debby PARAS. Curtis, M.D., ABFM., CAQSM., AME. Primary Care and Sports Medicine [facility]  Adjunct Professor of [facility] Medicine  [employer]  Rest[employer], [employer]

## 2023-05-30 NOTE — Telephone Encounter (Signed)
 Patient called and notified.

## 2023-05-30 NOTE — Assessment & Plan Note (Addendum)
 Now with a large laceration left posterior leg and calf. Stitch in urgent care, it is completely broken down, I removed the stitches. He is currently on antibiotics. I applied an Radio broadcast assistant and emphasized the importance of compression here. He will return here weekly for Unna boot changes and a nurse visit and I would also like him to touch base with the wound care center, return to see me in 4 weeks.

## 2023-05-30 NOTE — Telephone Encounter (Signed)
 Sherlynn Madden, MD  [facility]1 hour ago (9:41 AM)    Plz call the patient and inform that hemoglobin levels dropped to 10.3 from 11 U/s did not show  clot in the deep vein but possible clot in superficial veins as per previous ultrasound, if the wound is not healing and draining discharge  , will recommend going to ED.      Tried Calling patient, LMOM to return call.

## 2023-06-02 ENCOUNTER — Encounter: Payer: Medicare Other | Admitting: Sports Medicine

## 2023-06-04 IMAGING — DX DG CHEST 2V
2 series · 2 of 2 positions shown · non-contrast
Comparison: 05/22/2021.

CLINICAL DATA: Pt states he broke a rib on right side in [REDACTED].
Yesterday pain returned and has been getting worse on right side.
Denies known injury, cough or difficulty breathing.

EXAM:
CHEST - 2 VIEW

[chest pa]
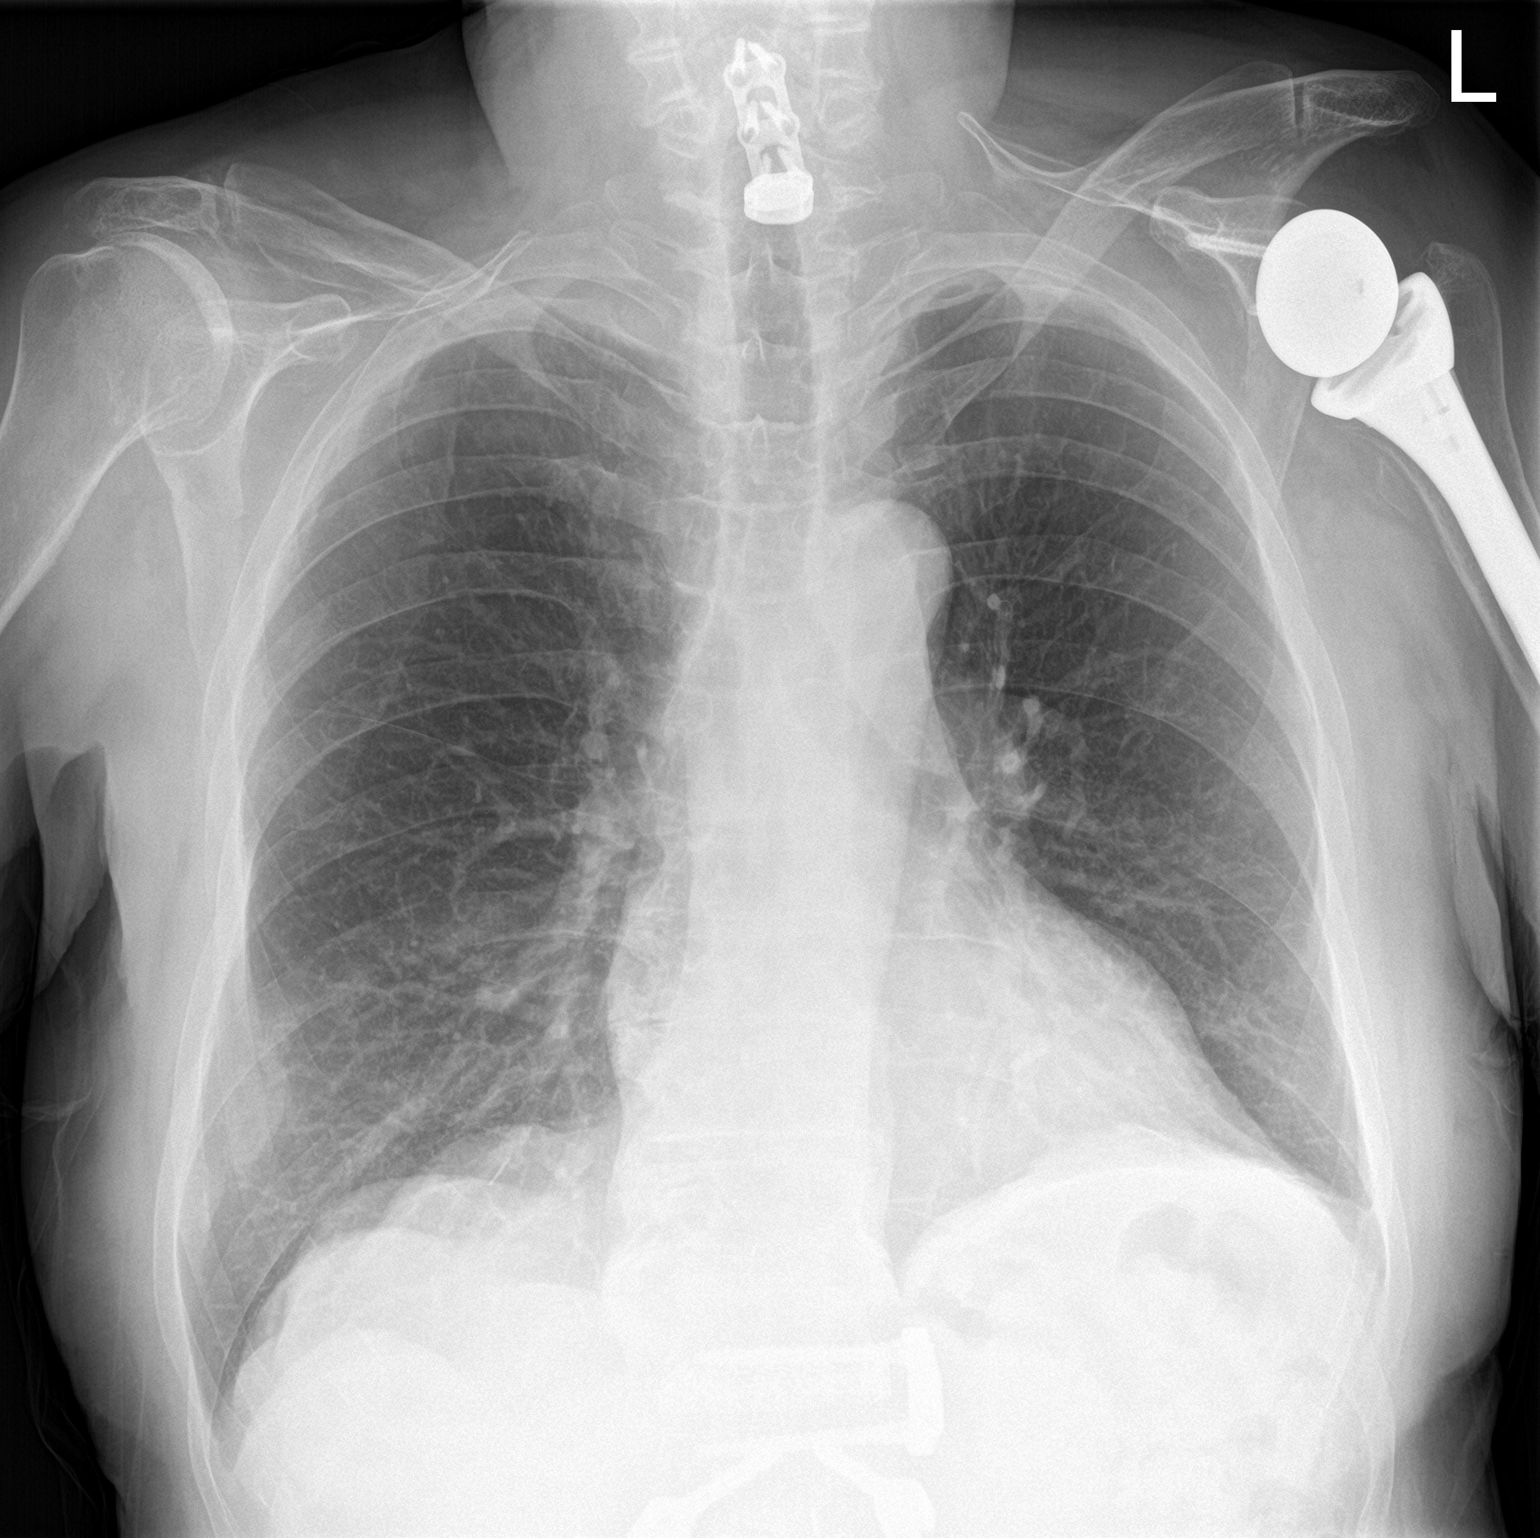

[chest lat]
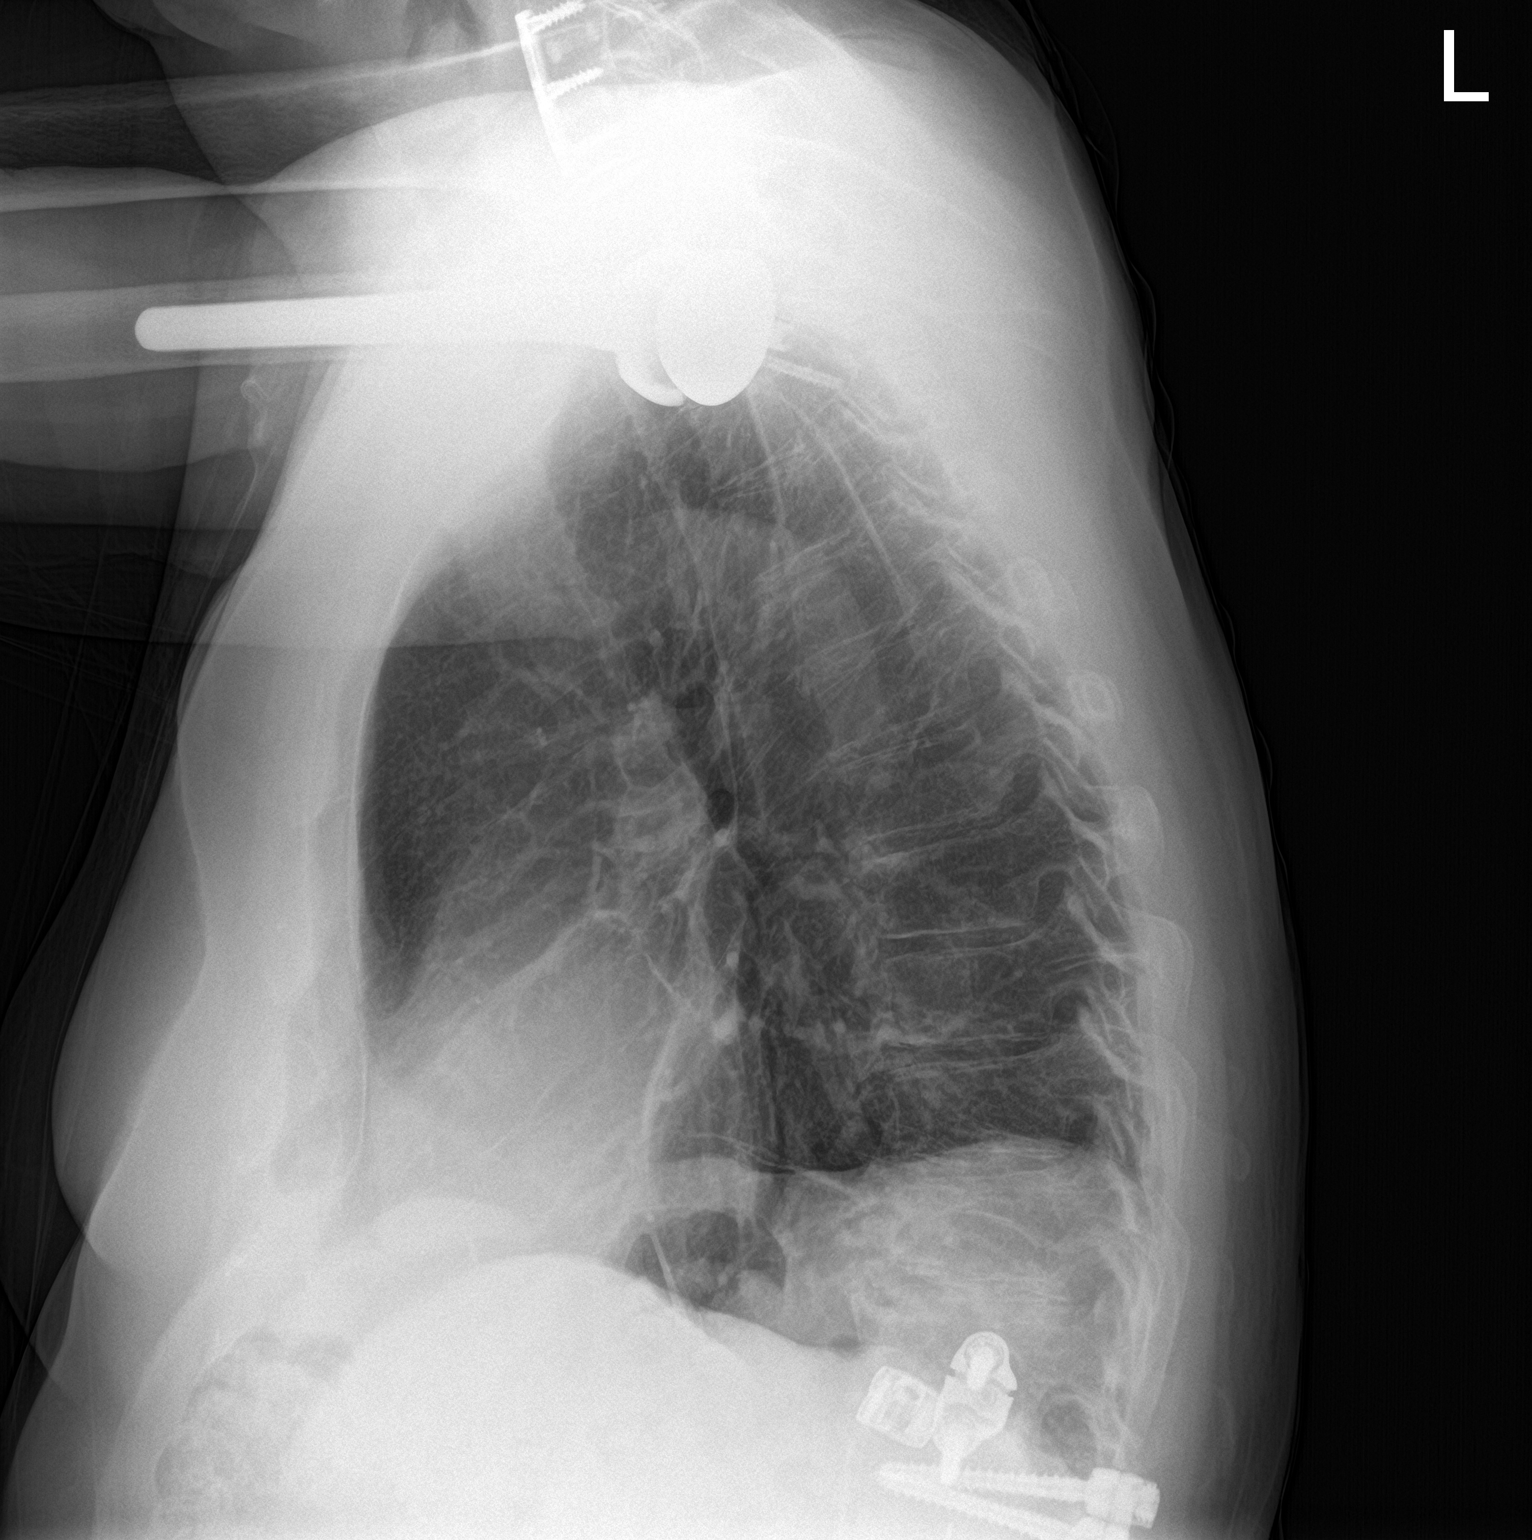

[2 of 2 positions shown; findings below may reference images not displayed]

FINDINGS: Right-sided rib fracture noted on the exam from 05/22/2021
demonstrates healing. No new fractures. No bone lesions.

Cardiac silhouette is normal in size. No mediastinal or hilar
masses. No evidence of adenopathy.

There are linear opacities in the lung bases consistent with
subsegmental atelectasis. Remainder of the lungs is clear.

No pneumothorax or pleural effusion.

Stable changes from prior cervical and lumbar spine fusion and a
left shoulder arthroplasty.
IMPRESSION: 1. No acute findings.
2. Healing right-sided sixth rib fracture.  No new fractures.

## 2023-06-06 ENCOUNTER — Other Ambulatory Visit: Payer: Self-pay | Admitting: Cardiology

## 2023-06-06 ENCOUNTER — Ambulatory Visit: Payer: Medicare Other

## 2023-06-06 DIAGNOSIS — R600 Localized edema: Secondary | ICD-10-CM | POA: Diagnosis not present

## 2023-06-06 DIAGNOSIS — I872 Venous insufficiency (chronic) (peripheral): Secondary | ICD-10-CM | POA: Diagnosis not present

## 2023-06-06 DIAGNOSIS — S81802A Unspecified open wound, left lower leg, initial encounter: Secondary | ICD-10-CM | POA: Diagnosis not present

## 2023-06-10 ENCOUNTER — Telehealth: Payer: Self-pay | Admitting: Internal Medicine

## 2023-06-10 ENCOUNTER — Encounter: Payer: Self-pay | Admitting: Internal Medicine

## 2023-06-10 NOTE — Telephone Encounter (Signed)
 Surgical clearance letter faxed to [facility]. Patient wife aware.

## 2023-06-10 NOTE — Progress Notes (Signed)
 Letter provided to [facility] for infectious disease clearance of planned spinal cord stimulator    06/10/2023  Referring Provider: Deatrice Manus  Patient Name: Levi [name]  D.O.B: June 17, 1945  Date of Visit: 06/10/2023   Reason for Visit: clearance for spine surgery     Dear Dr Manus I previously saw patient for elevated wbc of unclear etiology. We determined there was no active infectious process  He is on chronic prednisone  which could impair skin healing   Otherwise from our infectious disease standpoint, there is no absolute contraindication if patient needs a spinal cord stimulator  For staph aureus and mrsa decolonization or general decolonization/surgical preparation, there should be a universal protocol associated with the facility to do before the procedure. Please discuss this further with the facility infection prevention unit  If you further question please do not hesitate to contact me  Sincerely,         Constance ONEIDA Passer, MD [facility] for Infectious Disease [facility] [phone]  pager   [phone] cell 06/10/2023, 2:29 PM

## 2023-06-10 NOTE — Telephone Encounter (Signed)
 [name] called on behalf of her husband, Levi [name]. She wanted an update regarding Spinal Cord Stimulator documents sent from the surgeon for medical clearance. She stated the documents were confirmed as being received but they have not be updated on the status of completion. She can be reached at [phone]

## 2023-06-13 DIAGNOSIS — I872 Venous insufficiency (chronic) (peripheral): Secondary | ICD-10-CM | POA: Diagnosis not present

## 2023-06-13 DIAGNOSIS — S81802A Unspecified open wound, left lower leg, initial encounter: Secondary | ICD-10-CM | POA: Diagnosis not present

## 2023-06-13 DIAGNOSIS — T8133XD Disruption of traumatic injury wound repair, subsequent encounter: Secondary | ICD-10-CM | POA: Diagnosis not present

## 2023-06-13 DIAGNOSIS — R600 Localized edema: Secondary | ICD-10-CM | POA: Diagnosis not present

## 2023-06-15 NOTE — Progress Notes (Unsigned)
Subjective:    Patient ID: Sindy Guadeloupe, male    DOB: 12/31/1945, 78 y.o.   MRN: [id number] HPI  Male never smoker followed for allergy/asthma, rhinitis, history  ABPA, complicated by PAfib, GERD PFT- 01/04/2015-minimal restriction, minimal diffusion defect, insignificant response to bronchodilator. FVC 4.55/86%, FEV1 3.28/83%, FEV1/FVC 0.72, TLC 75%, DLCO 77 Office Spirometry 10/25/16-mild restriction of exhaled volume, minimal obstruction. FVC 4.04/78%, FEV1 2.71/71%, ratio 0.67, FEF 25-75% 1.68/58% Respiratory allergy profile 10/26/15- total IgE 60, minor elevations for dust mites and Aspergillus fumigatus ---------------------------------------------------------------------------------------------------.   09/23/22- 78 year old male never smoker followed for allergy/asthma, rhinitis, history ABPA, complicated by PAfib, CHF, GERD, Hypogammaglobulinemia, Chronic Steroid Therapy,  -Striverdi 2 puffs daily, pred maint 10 mg daily, Neb Duoneb,  albuterol hfa Recent fall with right rib fx He is working with cardiology actively now on congestive heart failure and understands the overlap of symptoms.  Asthma control has been good.  He does continue maintenance prednisone and Striverdi.  Occasional transient wheeze but no significant events.  Unfortunately he has had trouble with peripheral neuropathy, falls and fractures. CXR/ Right ribs 09/05/22- IMPRESSION: There is recent minimally displaced fracture in the anterolateral aspect of right ninth rib. There is no focal pulmonary consolidation. There is no pneumothorax. Deformities are seen in multiple bilateral ribs consistent with old healed fractures.  Addendum 10/30/22- surgical clearance. He is clear from pulmonary standpoint for planned foot surgery.  Recommend cardiac clearance. Note he is on maintenance prednisone.  06/17/23- 78 year old male never smoker followed for allergy/asthma, rhinitis, history ABPA, complicated by PAfib, CHF, GERD,  Hypogammaglobulinemia, Chronic Steroid Therapy,  -Striverdi 2 puffs daily, pred maint 10 mg twice daily, Neb Duoneb,  albuterol hfa   CXR 03/18/23 IMPRESSION: 1. No acute chest findings. 2. Chronic elevation of left hemidiaphragm.   ROS-see HPI   + = positive Constitutional:    weight loss, night sweats, fevers, chills, fatigue, lassitude. HEENT:    headaches, difficulty swallowing, tooth/dental problems, sore throat,       sneezing, itching, ear ache, nasal congestion, post nasal drip, snoring CV:    chest pain, orthopnea, PND, +swelling in lower extremities, anasarca,                                            dizziness, palpitations Resp:   + shortness of breath with exertion or at rest.                productive cough,  + non-productive cough, coughing up of blood.              change in color of mucus.   wheezing.   Skin:    rash or lesions. GI:  No-   heartburn, indigestion, abdominal pain, nausea, vomiting, diarrhea,                 change in bowel habits, loss of appetite GU: dysuria, change in color of urine, no urgency or frequency.   flank pain. MS:   joint pain, stiffness, decreased range of motion, back pain.. L scapula not tender. Neuro-     nothing unusual Psych:  change in mood or affect.  depression or anxiety.   memory loss.    Objective:  OBJ- Physical Exam General- Alert, Oriented, Affect-appropriate, Distress- none acute, tall/thin Skin- rash-none, lesions- none, excoriation- none Lymphadenopathy- none Head- atraumatic  Eyes- Gross vision intact, PERRLA, conjunctivae and secretions clear            Ears- Hearing, canals-normal            Nose- Clear, no-Septal dev, mucus, polyps, erosion, perforation             Throat- Mallampati II , mucosa clear , drainage- none, tonsils- atrophic Neck- flexible , trachea midline, no stridor , thyroid nl, carotid no bruit Chest - symmetrical excursion , unlabored           Heart/CV- RRR , no murmur , no gallop   , no rub, nl s1 s2                           - JVD+trace , edema+2-3 pitting, stasis changes- none, varices+ Left calf           Lung- +clear,  wheeze- none, cough- none , dullness-none, rub- none           Chest wall-  Abd-  Br/ Gen/ Rectal- Not done, not indicated Extrem- cyanosis- none, clubbing, none, atrophy- none, strength- nl Neuro- + head bob and hands tremor    Assessment & Plan:

## 2023-06-17 ENCOUNTER — Ambulatory Visit: Payer: Medicare Other | Admitting: Internal Medicine

## 2023-06-17 ENCOUNTER — Encounter: Payer: Self-pay | Admitting: Sports Medicine

## 2023-06-17 ENCOUNTER — Encounter: Payer: Self-pay | Admitting: Internal Medicine

## 2023-06-17 ENCOUNTER — Ambulatory Visit: Payer: Medicare Other | Admitting: Sports Medicine

## 2023-06-17 VITALS — BP 127/88 | HR 86 | Temp 97.1°F | Resp 18 | Ht 74.0 in | Wt 205.6 lb

## 2023-06-17 VITALS — BP 150/80 | HR 41 | Ht 74.0 in | Wt 204.6 lb

## 2023-06-17 DIAGNOSIS — T148XXA Other injury of unspecified body region, initial encounter: Secondary | ICD-10-CM | POA: Diagnosis not present

## 2023-06-17 DIAGNOSIS — M14671 Charcot's joint, right ankle and foot: Secondary | ICD-10-CM

## 2023-06-17 DIAGNOSIS — G629 Polyneuropathy, unspecified: Secondary | ICD-10-CM

## 2023-06-17 DIAGNOSIS — J4540 Moderate persistent asthma, uncomplicated: Secondary | ICD-10-CM

## 2023-06-17 DIAGNOSIS — E790 Hyperuricemia without signs of inflammatory arthritis and tophaceous disease: Secondary | ICD-10-CM

## 2023-06-17 DIAGNOSIS — M7989 Other specified soft tissue disorders: Secondary | ICD-10-CM | POA: Diagnosis not present

## 2023-06-17 DIAGNOSIS — R2689 Other abnormalities of gait and mobility: Secondary | ICD-10-CM

## 2023-06-17 DIAGNOSIS — E538 Deficiency of other specified B group vitamins: Secondary | ICD-10-CM | POA: Diagnosis not present

## 2023-06-17 DIAGNOSIS — D649 Anemia, unspecified: Secondary | ICD-10-CM

## 2023-06-17 DIAGNOSIS — J449 Chronic obstructive pulmonary disease, unspecified: Secondary | ICD-10-CM

## 2023-06-17 DIAGNOSIS — F320 Major depressive disorder, single episode, mild: Secondary | ICD-10-CM

## 2023-06-17 MED ORDER — PREGABALIN 100 MG PO CAPS: 100.0000 mg | ORAL_CAPSULE | Freq: Two times a day (BID) | ORAL | 2 refills | Status: DC

## 2023-06-17 MED ORDER — DULOXETINE HCL 30 MG PO CPEP: 30.0000 mg | ORAL_CAPSULE | Freq: Every day | ORAL | 3 refills | Status: DC

## 2023-06-17 NOTE — Progress Notes (Signed)
Careteam: Patient Care Team: Venita Sheffield, MD as PCP - General (Internal Medicine) Jake Bathe, MD as PCP - Cardiology (Cardiology) Lanier Prude, MD as PCP - Electrophysiology (Cardiology)  PLACE OF SERVICE:  [facility]  Advanced Directive information    Allergies  Allergen Reactions   Levaquin [Levofloxacin] Itching    Tolerated ciprofloxacin in 2022.    Chief Complaint  Patient presents with   Medical Management of Chronic Issues    Three Weeks Follow-up    Immunizations     Shingrix. NCIR verified     Discussed the use of AI scribe software for clinical note transcription with the patient, who gave verbal consent to proceed.  History of Present Illness   The patient, with a history of chronic obstructive pulmonary disease (COPD), presented for a follow-up. Pt went to ED on 05/16/23 for left leg laceration   The wound, described as having a tunnel-like structure, is managed with weekly dressing changes and application of medical honey. He is currently on Bactrim as per infectious disease. Currently follows with wound clinic and has appt with Vascular surgery.  The patient also reported a history of falls, with the most recent incident in December leading to the opening of surgical stitches. Despite these falls, the patient remains active, Pt c/o multiple joint pains, patient is awaiting a spinal cord stimulator to help manage the pain. Follows with neurosurgery.  Currently on lyrica.   The patient has been experiencing swelling in the legs since few months. He had Echo recently which showed EF 40-45% he is currently on lasix 40 mg daily   The patient has been prescribed Lyrica for neuropathy. The patient reported feeling worried and occasionally depressed, with sleep disturbances characterized by waking up in the early hours and being unable to return to sleep.  PHQ9- 7   The patient has a history of high uric acid levels but denied ever having gout.  He is currently on allopurinol as a preventative measure. The patient also has a history of smoking but quit in the 1980s. He uses albuterol as needed for wheezing and another inhaler, Striverdi Respimat, for COPD management. The patient also reported taking prednisone twice daily.       Review of Systems:  Review of Systems  Constitutional:  Negative for chills and fever.  HENT:  Negative for congestion and sore throat.   Eyes:  Negative for double vision.  Respiratory:  Negative for cough, sputum production and shortness of breath.   Cardiovascular:  Positive for leg swelling. Negative for chest pain and palpitations.  Gastrointestinal:  Negative for abdominal pain, heartburn and nausea.  Genitourinary:  Negative for dysuria, frequency and hematuria.  Musculoskeletal:  Positive for falls and joint pain. Negative for myalgias.  Neurological:  Positive for sensory change. Negative for dizziness and focal weakness.  Psychiatric/Behavioral:  Positive for depression. Negative for suicidal ideas.    Negative unless indicated in HPI.   Past Medical History:  Diagnosis Date   Agent orange exposure 1970's   takes Imdur,Diltiazem, and Atacand daily   Agent orange exposure    ALLERGIC RHINITIS    Allergy    seasonal allergies   Asthma    uses inhaler and nebulizers   Atrial fibrillation (HCC)    Bruises easily    d/t prednisone daily   Cataract    Cellulitis 03/22/2020   Chest wall pain 09/21/2019   L scapula, reported 4/27 21/     CHF (congestive heart failure) (HCC)  dx-on meds to treat   Chronic bronchitis (HCC)    "used to get it q yr; last time ?2008" (04/28/2012)   Clotting disorder (HCC) 2019   On blood thinner when I bleed it is for a consideral amount of time. This is tegardless of size of wound.   COPD (chronic obstructive pulmonary disease) (HCC)    agent orange exposure   Degenerative disk disease    "everywhere" (04/28/2012)   Diverticulitis    Dysrhythmia    afib    Elevated uric acid in blood    takes Allopurinol daily   Emphysema of lung (HCC)    Enlarged prostate    but not on any meds   GERD (gastroesophageal reflux disease)    takes Protonix daily   H/O hiatal hernia    Headache    History of colonoscopy    History of pneumonia    a. 2010   Hyperlipidemia    takes Lipitor daily; pt. states he takes a preventive   Hypothyroidism    Joint pain    Joint swelling    Leukocytosis    MSSA bacteremia 03/19/2023   Nausea and vomiting 11/12/2022   Neuromuscular disorder (HCC)    bilat legs, and bilat arms   Neuropathy    Osteoporosis 2012   PAF (paroxysmal atrial fibrillation) (HCC)    a. Dx 04/2012, CHA2DS2VASc = 1 (age);  b. 04/2012 Echo: EF 40-50%, mild MR.   Personal history of colonic adenomas 02/09/2013   Pneumonia    Sepsis (HCC) 03/18/2023   Shortness of breath dyspnea    Thyroid disease    no taking meds-caused dizziness   Past Surgical History:  Procedure Laterality Date   24 HOUR PH STUDY N/A 05/30/2021   Procedure: 24 HOUR PH STUDY;  Surgeon: Shellia Cleverly, DO;  Location: [facility];  Service: Gastroenterology;  Laterality: N/A;   ANTERIOR CERVICAL DECOMP/DISCECTOMY FUSION N/A 07/21/2012   Procedure: ANTERIOR CERVICAL DECOMPRESSION/DISCECTOMY FUSION 2 LEVELS;  Surgeon: Barnett Abu, MD;  Location: [facility];  Service: Neurosurgery;  Laterality: N/A;  C4-5 C5-6 Anterior cervical decompression/diskectomy/fusion   ANTERIOR LAT LUMBAR FUSION N/A 04/07/2017   Procedure: Thoracic twelve-Lumbar one Anterolateral decompression/fusion;  Surgeon: Barnett Abu, MD;  Location: [facility] OR;  Service: Neurosurgery;  Laterality: N/A;   ARTHRODESIS FOOT WITH WEIL OSTEOTOMY Left 11/07/2022   Procedure: LEFT THIRD METATARSOPHALANGEAL CAPSULOTOMY, WEIL OSTEOTOMY;  Surgeon: Terance Hart, MD;  Location: [facility] OR;  Service: Orthopedics;  Laterality: Left;  LENGTH OF SURGERY: 60 MINUTES   BACK SURGERY     x 3   CARDIAC CATHETERIZATION   11/2008   Dr. Anne Fu - 20% calcified non flow limiting left main, 50% EF apical hypokinesis   CERVICAL DISC ARTHROPLASTY N/A 04/07/2017   Procedure: Cervical six-seven Disc arthroplasty;  Surgeon: Barnett Abu, MD;  Location[facility];  Service: Neurosurgery;  Laterality: N/A;   CHEST TUBE INSERTION Left 04/11/2017   Procedure: CHEST TUBE INSERTION;  Surgeon: Kerin Perna, MD;  Location: [facility] OR;  Service: Thoracic;  Laterality: Left;   CHOLECYSTECTOMY     COLONOSCOPY  2016   CG-MAC-miralax(good)servere TICS/TA x 2   ESOPHAGEAL MANOMETRY N/A 05/30/2021   Procedure: ESOPHAGEAL MANOMETRY (EM);  Surgeon: Shellia Cleverly, DO;  Location: [facility];  Service: Gastroenterology;  Laterality: N/A;  ph impedence   ESOPHAGOGASTRODUODENOSCOPY     EYE SURGERY Bilateral 2011   steroidal encapsulation" (04/28/2012)   FOOT SURGERY Right    x 2   JOINT REPLACEMENT  Many surgeries including 5 joit replacements   KNEE ARTHROSCOPY Bilateral    LATERAL / POSTERIOR COMBINED FUSION LUMBAR SPINE  2012   LEFT ATRIAL APPENDAGE OCCLUSION N/A 02/22/2021   Procedure: LEFT ATRIAL APPENDAGE OCCLUSION;  Surgeon: Lanier Prude, MD;  [facility]: [facility];  Service: Cardiovascular;  Laterality: N/A;   POLYPECTOMY  2016   severe TICS/TA x 2   POSTERIOR CERVICAL LAMINECTOMY WITH MET- RX Right 06/11/2021   Procedure: Right Cervical six-seven  Laminectomy and foraminotomy with metrex;  Surgeon: Barnett Abu, MD;  Location: [facility] OR;  Service: Neurosurgery;  Laterality: Right;   REVERSE SHOULDER ARTHROPLASTY  04/28/2012   Procedure: REVERSE SHOULDER ARTHROPLASTY;  Surgeon: Mable Paris, MD;  Location: [facility] OR;  Service: Orthopedics;  Laterality: Left;  Left shouder reverse total shoulder arthroplasty   SHOULDER SURGERY     SPINE SURGERY     I have had 7 spinal surgeries   TEE WITHOUT CARDIOVERSION N/A 02/22/2021   Procedure: TRANSESOPHAGEAL ECHOCARDIOGRAM (TEE);  Surgeon: Lanier Prude, MD;   Location: [facility] INVASIVE CV LAB;  Service: Cardiovascular;  Laterality: N/A;   TENDON REPAIR Right 08/07/2020   Procedure: RIGHT HAND LIGAMENT RECONSTRUCTION AND TENDON INTERPOSITION;  Surgeon: Jodi Geralds, MD;  Location: [facility];  Service: Orthopedics;  Laterality: Right;   TONSILLECTOMY AND ADENOIDECTOMY  1954   TOTAL HIP ARTHROPLASTY Left 2011   "left" (04/28/2012)   TOTAL HIP ARTHROPLASTY Right 07/31/2015   Procedure: TOTAL HIP ARTHROPLASTY ANTERIOR APPROACH;  Surgeon: Jodi Geralds, MD;  Location: [facility] OR;  Service: Orthopedics;  Laterality: Right;   TOTAL KNEE ARTHROPLASTY  01/10/2012   Procedure: TOTAL KNEE ARTHROPLASTY;  Surgeon: Harvie Junior, MD;  Location: [facility] OR;  Service: Orthopedics;;  left total knee arthroplasty   TOTAL KNEE ARTHROPLASTY Right 06/17/2014   Procedure: TOTAL KNEE ARTHROPLASTY;  Surgeon: Harvie Junior, MD;  Location: [facility] OR;  Service: Orthopedics;  Laterality: Right;   toupet fundolplication  11/01/2021   TRANSESOPHAGEAL ECHOCARDIOGRAM (CATH LAB) N/A 03/24/2023   Procedure: TRANSESOPHAGEAL ECHOCARDIOGRAM;  Surgeon: Chrystie Nose, MD;  Location: [facility] CV [facility];  Service: Cardiovascular;  Laterality: N/A;   Social History:   reports that he has never smoked. He has never used smokeless tobacco. He reports that he does not drink alcohol and does not use drugs.  Family History  Problem Relation Age of Onset   Diabetes Mother    Heart disease Father        Died 78, MI   Cancer Father    Hyperlipidemia Father    Stroke Father    Prostate cancer Brother    Colon polyps Brother    Colon polyps Brother    Asthma Brother    COPD Brother    Colon cancer Brother        Dx age 68   Cancer Brother    Colon cancer Other    Esophageal cancer Neg Hx    Rectal cancer Neg Hx    Stomach cancer Neg Hx    Pancreatic cancer Neg Hx    Kidney disease Neg Hx    Liver disease Neg Hx    Neuropathy Neg Hx     Medications: Patient's Medications  New  Prescriptions   No medications on file  Previous Medications   ALBUTEROL (VENTOLIN HFA) 108 (90 BASE) MCG/ACT INHALER    INHALE 2 PUFFS BY MOUTH EVERY 4 HOURS AS NEEDED FOR WHEEZE OR FOR SHORTNESS OF BREATH   ALLOPURINOL (ZYLOPRIM)  100 MG TABLET    Take 1 tablet (100 mg total) by mouth in the morning.   AMOXICILLIN-CLAVULANATE (AUGMENTIN) 875-125 MG TABLET    Take 1 tablet by mouth 2 (two) times daily.   ATORVASTATIN (LIPITOR) 20 MG TABLET    TAKE 1 TABLET BY MOUTH EVERY DAY   CYCLOBENZAPRINE (FLEXERIL) 10 MG TABLET    Take 10 mg by mouth as needed for muscle spasms.   DILTIAZEM (CARDIZEM CD) 180 MG 24 HR CAPSULE    TAKE 1 CAPSULE BY MOUTH EVERY DAY   DILTIAZEM (CARDIZEM) 30 MG TABLET    TAKE 1 TABLET (30 MG TOTAL) BY MOUTH AS NEEDED (HEART RATE OVER 120).   DULOXETINE (CYMBALTA) 30 MG CAPSULE    Take 1 capsule (30 mg total) by mouth daily.   ELIQUIS 5 MG TABS TABLET    TAKE 1 TABLET BY MOUTH TWICE A DAY   FUROSEMIDE (LASIX) 40 MG TABLET    Take 1 tablet (40 mg total) by mouth daily.   IPRATROPIUM-ALBUTEROL (DUONEB) 0.5-2.5 (3) MG/3ML SOLN    1 NEBULE EVERY 6 HOURS AS NEEDED. **J45.3**   ISOSORBIDE MONONITRATE (IMDUR) 30 MG 24 HR TABLET    TAKE 1 TABLET BY MOUTH EVERY DAY   MECLIZINE (ANTIVERT) 25 MG TABLET    Take 1 tablet (25 mg total) by mouth 3 (three) times daily as needed for dizziness.   NEBULIZERS (COMPRESSOR/NEBULIZER) MISC    Use as directed   OLODATEROL HCL (STRIVERDI RESPIMAT) 2.5 MCG/ACT AERS    INHALE 2 PUFFS BY MOUTH INTO THE LUNGS DAILY   ONDANSETRON (ZOFRAN-ODT) 8 MG DISINTEGRATING TABLET    Take 1 tablet (8 mg total) by mouth every 8 (eight) hours as needed for nausea.   OXYCODONE-ACETAMINOPHEN (PERCOCET) 10-325 MG TABLET    Take 1 tablet by mouth every 8 (eight) hours as needed for pain.   PANTOPRAZOLE (PROTONIX) 40 MG TABLET    TAKE 1 TABLET (40 MG TOTAL) BY MOUTH TWICE A DAY BEFORE MEALS   PREDNISONE (DELTASONE) 10 MG TABLET    Take 1 tablet (10 mg total) by mouth in the  morning and at bedtime.   PREGABALIN (LYRICA) 100 MG CAPSULE    Take 1 capsule (100 mg total) by mouth 2 (two) times daily.   SULFAMETHOXAZOLE-TRIMETHOPRIM (BACTRIM) 400-80 MG TABLET    Take 1 tablet by mouth daily starting 05/02/2023   TAMSULOSIN (FLOMAX) 0.4 MG CAPS CAPSULE    Take 0.4 mg by mouth at bedtime.  Modified Medications   No medications on file  Discontinued Medications   No medications on file    Physical Exam: Vitals:   06/17/23 1027  BP: 127/88  Pulse: 86  Resp: 18  Temp: (!) 97.1 F (36.2 C)  SpO2: 96%  Weight: 205 lb 9.6 oz (93.3 kg)  Height: 6\' 2"  (1.88 m)   Body mass index is 26.4 kg/m. BP Readings from Last 3 Encounters:  06/17/23 127/88  05/30/23 97/65  05/27/23 118/64   Wt Readings from Last 3 Encounters:  06/17/23 205 lb 9.6 oz (93.3 kg)  05/30/23 205 lb (93 kg)  05/27/23 205 lb 9.6 oz (93.3 kg)    Physical Exam Constitutional:      Appearance: Normal appearance.  HENT:     Head: Normocephalic and atraumatic.  Cardiovascular:     Rate and Rhythm: Normal rate and regular rhythm.     Pulses: Normal pulses.     Heart sounds: Normal heart sounds.  Pulmonary:  Effort: No respiratory distress.     Breath sounds: No stridor. No wheezing or rales.  Abdominal:     General: Bowel sounds are normal. There is no distension.     Palpations: Abdomen is soft.     Tenderness: There is no abdominal tenderness. There is no right CVA tenderness or guarding.  Musculoskeletal:        General: Swelling present.     Comments: Left leg wrapped  Rt leg 1+ swelling +  Neurological:     Mental Status: He is alert. Mental status is at baseline.     Sensory: No sensory deficit.     Motor: No weakness.     Labs reviewed: Basic Metabolic Panel: Recent Labs    07/05/22 0908 11/07/22 1038 04/23/23 0441 05/02/23 0931 05/27/23 1435  NA 140   < > 132* 137 141  K 4.4   < > 3.8 4.7 4.3  CL 104   < > 99 100 106  CO2 25   < > 21* 22 26  GLUCOSE 113*   < >  129* 80 99  BUN 31*   < > 12 19 23   CREATININE 1.01   < > 0.90 1.13 0.85  CALCIUM 10.0   < > 8.6* 9.3 8.9  TSH 1.70  --   --   --   --    < > = values in this interval not displayed.   Liver Function Tests: Recent Labs    04/17/23 0909 04/17/23 1228 04/18/23 0416  AST 20 21 16   ALT 14 17 14   ALKPHOS 116 83 69  BILITOT 0.7 1.1 1.1  PROT 5.9* 5.8* 5.0*  ALBUMIN 4.0 3.1* 2.6*   Recent Labs    11/12/22 0920  LIPASE 10   No results for input(s): "AMMONIA" in the last 8760 hours. CBC: Recent Labs    04/17/23 0909 04/17/23 1228 04/18/23 0416 04/22/23 0358 05/02/23 0931 05/27/23 1435  WBC 21.0* 19.8*   < > 6.8 15.5* 9.6  NEUTROABS 17.6* 16.1*  --   --   --  6,730  HGB 12.5* 11.7*   < > 10.7* 11.3* 10.2*  HCT 38.2 37.4*   < > 32.8* 35.0* 31.7*  MCV 94 96.6   < > 93.2 93 92.2  PLT 202 179   < > 179 266 199   < > = values in this interval not displayed.   Lipid Panel: Recent Labs    07/05/22 0908  CHOL 208*  HDL 112  LDLCALC 82  TRIG 65  CHOLHDL 1.9   TSH: Recent Labs    07/05/22 0908  TSH 1.70   A1C: Lab Results  Component Value Date   HGBA1C 5.8 (H) 07/05/2022     Assessment/Plan  Chronic Wound Ongoing wound care with wound specialist. Wound has a tunneling component and is being managed with packing and med honey. No systemic signs of infection noted. -Continue current wound care regimen. -Next wound care appointment on Friday.  Anemia Low hemoglobin noted on recent blood work. No reported blood in urine or stool. -Order blood tests to check iron levels and B12.  Iron, TIBC and Ferritin Panel - Vitamin B12 - Fecal Globin By Immunochemistry; Future - Fecal Globin By Immunochemistry  Chronic Obstructive Pulmonary Disease (COPD)  . No wheezing noted on examination.  Current medications include Striverdi Respimat and Qvar. -Continue current inhaler regimen. -follow up with pulmonology   Lower Extremity Edema  No signs of heart failure on  examination. -  Continue   furosemide 40mg  daily.  -Consider ACE wrap   -Limit salt intake and elevate legs when possible. will message his cardiologist aboutr switching Cardizem to Metoprolol to reduce edema.  Depression Patient reports feeling down and depressed at times, with some sleep disturbance and increased appetite. -Start Cymbalta 30mg  daily for depression, anxiety, and neuropathic pain. -Monitor mood and adjust treatment as necessary.    Balance problems Ambulatory referral to Physical Therapy   Neuropathy  pregabalin (LYRICA) 100 MG capsule; Take 1 capsule (100 mg total) by mouth 2 (two) times daily.  Dispense: 60 capsule; Refill: 2 - DULoxetine (CYMBALTA) 30 MG capsule; Take 1 capsule (30 mg total) by mouth daily.  Dispense: 90 capsule; Refill: 3  Hyperuricemia   - Uric Acid     40 min  Total time spent for obtaining history,  performing a medically appropriate examination and evaluation, reviewing the tests,ordering  tests,  documenting clinical information in the electronic or other health record,  care coordination (not separately reported)

## 2023-06-17 NOTE — Patient Instructions (Signed)
We can continue current meds  Ok to use your albuterol inhaler and/ or your nebulizer machine to help up to every 6 hours if needed  Try cutting some prednisone tablets in half, so you can reduce to taking 1.5 tabs (15 mg total) daily.

## 2023-06-18 ENCOUNTER — Other Ambulatory Visit: Payer: Self-pay | Admitting: Sports Medicine

## 2023-06-18 ENCOUNTER — Telehealth: Payer: Self-pay | Admitting: Internal Medicine

## 2023-06-18 LAB — VITAMIN B12: Vitamin B-12: 202 pg/mL (ref 200–1100)

## 2023-06-18 LAB — IRON,TIBC AND FERRITIN PANEL
%SAT: 9 % — ABNORMAL LOW (ref 20–48)
Ferritin: 41 ng/mL (ref 24–380)
Iron: 34 ug/dL — ABNORMAL LOW (ref 50–180)
TIBC: 381 ug/dL (ref 250–425)

## 2023-06-18 LAB — URIC ACID: Uric Acid, Serum: 5.6 mg/dL (ref 4.0–8.0)

## 2023-06-18 MED ORDER — FERROUS GLUCONATE 324 (38 FE) MG PO TABS: 324.0000 mg | ORAL_TABLET | Freq: Every day | ORAL | 2 refills | Status: DC

## 2023-06-18 NOTE — Telephone Encounter (Signed)
Pharmacy please advise on holding Eliquis prior to spinal cord stimulator trial scheduled for 07/09/2023. Thank you.

## 2023-06-18 NOTE — Telephone Encounter (Signed)
   Pre-operative Risk Assessment    Patient Name: Levi [name]  DOB: 07-20-1945 MRN: [id number]   Date of last office visit: 10/25/2022 Date of next office visit: none   Request for Surgical Clearance    Procedure:   spinal cord stimulator trial  Date of Surgery:  Clearance 07/09/23                                Surgeon:  Dr. Aileen Fass Surgeon's Group or Practice Name:  [facility] Phone number: [phone] Fax number:  [phone]   Type of Clearance Requested:   - Pharmacy:  Hold Apixaban (Eliquis) 3 days prior to resume after procedure   Type of Anesthesia:  None    Additional requests/questions:    [name]   06/18/2023, 4:26 PM

## 2023-06-19 NOTE — Telephone Encounter (Signed)
   Patient Name: Levi [name]  DOB: 08/19/1945 MRN: [id number]  Primary Cardiologist: Donato Schultz, MD  Clinical pharmacists have reviewed the patient's past medical history, labs, and current medications as part of preoperative protocol coverage. The following recommendations have been made:  Per office protocol, patient can hold Eliquis for 3 days prior to procedure.    I will route this recommendation to the requesting party via Epic fax function and remove from pre-op pool.  Please call with questions.  Napoleon Form, Leodis Rains, NP 06/19/2023, 1:01 PM

## 2023-06-19 NOTE — Telephone Encounter (Signed)
Patient with diagnosis of afib on Eliquis for anticoagulation.    Procedure: spinal cord stimulator trial  Date of procedure: 07/09/23   CHA2DS2-VASc Score = 4   This indicates a 4.8% annual risk of stroke. The patient's score is based upon: CHF History: 1 HTN History: 1 Diabetes History: 0 Stroke History: 0 Vascular Disease History: 0 Age Score: 2 Gender Score: 0      CrCl 90 ml/min Platelet count 199  Per office protocol, patient can hold Eliquis for 3 days prior to procedure.    **This guidance is not considered finalized until pre-operative APP has relayed final recommendations.**

## 2023-06-20 ENCOUNTER — Telehealth: Payer: Self-pay | Admitting: Sports Medicine

## 2023-06-20 DIAGNOSIS — T8133XD Disruption of traumatic injury wound repair, subsequent encounter: Secondary | ICD-10-CM | POA: Diagnosis not present

## 2023-06-20 DIAGNOSIS — S81802A Unspecified open wound, left lower leg, initial encounter: Secondary | ICD-10-CM | POA: Diagnosis not present

## 2023-06-20 DIAGNOSIS — I872 Venous insufficiency (chronic) (peripheral): Secondary | ICD-10-CM | POA: Diagnosis not present

## 2023-06-20 DIAGNOSIS — D649 Anemia, unspecified: Secondary | ICD-10-CM | POA: Diagnosis not present

## 2023-06-20 DIAGNOSIS — R600 Localized edema: Secondary | ICD-10-CM | POA: Diagnosis not present

## 2023-06-20 MED ORDER — METOPROLOL SUCCINATE ER 50 MG PO TB24: 50.0000 mg | ORAL_TABLET | Freq: Every day | ORAL | 3 refills | Status: DC

## 2023-06-20 NOTE — Telephone Encounter (Signed)
Venita Sheffield, MD  [facility] Clinical3 hours ago (11:37 AM)    Plz call the patient and inform that cardiologist agreed to stop cardizem and start metoprolol 50 mg daily Script sent to his pharmacy      Tried calling patient, LMOM to return call.

## 2023-06-21 LAB — FECAL GLOBIN BY IMMUNOCHEMISTRY
FECAL GLOBIN RESULT:: DETECTED — AB
MICRO NUMBER:: 15994532
SPECIMEN QUALITY:: ADEQUATE

## 2023-06-23 NOTE — Telephone Encounter (Signed)
Patient notified and agreed.

## 2023-06-24 DIAGNOSIS — H43813 Vitreous degeneration, bilateral: Secondary | ICD-10-CM | POA: Diagnosis not present

## 2023-06-24 DIAGNOSIS — H31091 Other chorioretinal scars, right eye: Secondary | ICD-10-CM | POA: Diagnosis not present

## 2023-06-24 DIAGNOSIS — H53001 Unspecified amblyopia, right eye: Secondary | ICD-10-CM | POA: Diagnosis not present

## 2023-06-24 DIAGNOSIS — H35432 Paving stone degeneration of retina, left eye: Secondary | ICD-10-CM | POA: Diagnosis not present

## 2023-06-24 DIAGNOSIS — H353131 Nonexudative age-related macular degeneration, bilateral, early dry stage: Secondary | ICD-10-CM | POA: Diagnosis not present

## 2023-06-24 DIAGNOSIS — H02403 Unspecified ptosis of bilateral eyelids: Secondary | ICD-10-CM | POA: Diagnosis not present

## 2023-06-24 DIAGNOSIS — Z961 Presence of intraocular lens: Secondary | ICD-10-CM | POA: Diagnosis not present

## 2023-06-24 DIAGNOSIS — H26493 Other secondary cataract, bilateral: Secondary | ICD-10-CM | POA: Diagnosis not present

## 2023-06-24 DIAGNOSIS — H526 Other disorders of refraction: Secondary | ICD-10-CM | POA: Diagnosis not present

## 2023-06-26 ENCOUNTER — Ambulatory Visit: Payer: Medicare Other | Admitting: Rehabilitative and Restorative Service Providers"

## 2023-06-26 NOTE — Therapy (Deleted)
 OUTPATIENT PHYSICAL THERAPY NEURO EVALUATION   Patient Name: Levi [name] MRN: [id number] DOB:November 09, 1945, 78 y.o., male Today's Date: 06/26/2023   PCP: Venita Sheffield, MD REFERRING PROVIDER: Venita Sheffield, MD  END OF SESSION:   Past Medical History:  Diagnosis Date   Agent orange exposure 1970's   takes Imdur,Diltiazem, and Atacand daily   Agent orange exposure    ALLERGIC RHINITIS    Allergy    seasonal allergies   Asthma    uses inhaler and nebulizers   Atrial fibrillation (HCC)    Bruises easily    d/t prednisone daily   Cataract    Cellulitis 03/22/2020   Chest wall pain 09/21/2019   L scapula, reported 4/27 21/     CHF (congestive heart failure) (HCC)    dx-on meds to treat   Chronic bronchitis (HCC)    "used to get it q yr; last time ?2008" (04/28/2012)   Clotting disorder (HCC) 2019   On blood thinner when I bleed it is for a consideral amount of time. This is tegardless of size of wound.   COPD (chronic obstructive pulmonary disease) (HCC)    agent orange exposure   Degenerative disk disease    "everywhere" (04/28/2012)   Diverticulitis    Dysrhythmia    afib   Elevated uric acid in blood    takes Allopurinol daily   Emphysema of lung (HCC)    Enlarged prostate    but not on any meds   GERD (gastroesophageal reflux disease)    takes Protonix daily   H/O hiatal hernia    Headache    History of colonoscopy    History of pneumonia    a. 2010   Hyperlipidemia    takes Lipitor daily; pt. states he takes a preventive   Hypothyroidism    Joint pain    Joint swelling    Leukocytosis    MSSA bacteremia 03/19/2023   Nausea and vomiting 11/12/2022   Neuromuscular disorder (HCC)    bilat legs, and bilat arms   Neuropathy    Osteoporosis 2012   PAF (paroxysmal atrial fibrillation) (HCC)    a. Dx 04/2012, CHA2DS2VASc = 1 (age);  b. 04/2012 Echo: EF 40-50%, mild MR.   Personal history of colonic adenomas 02/09/2013   Pneumonia    Sepsis  (HCC) 03/18/2023   Shortness of breath dyspnea    Thyroid disease    no taking meds-caused dizziness   Past Surgical History:  Procedure Laterality Date   24 HOUR PH STUDY N/A 05/30/2021   Procedure: 24 HOUR PH STUDY;  Surgeon: Shellia Cleverly, DO;  Location: [facility];  Service: Gastroenterology;  Laterality: N/A;   ANTERIOR CERVICAL DECOMP/DISCECTOMY FUSION N/A 07/21/2012   Procedure: ANTERIOR CERVICAL DECOMPRESSION/DISCECTOMY FUSION 2 LEVELS;  Surgeon: Barnett Abu, MD;  [facility]: [facility];  Service: Neurosurgery;  Laterality: N/A;  C4-5 C5-6 Anterior cervical decompression/diskectomy/fusion   ANTERIOR LAT LUMBAR FUSION N/A 04/07/2017   Procedure: Thoracic twelve-Lumbar one Anterolateral decompression/fusion;  Surgeon: Barnett Abu, MD;  Location: [facility];  Service: Neurosurgery;  Laterality: N/A;   ARTHRODESIS FOOT WITH WEIL OSTEOTOMY Left 11/07/2022   Procedure: LEFT THIRD METATARSOPHALANGEAL CAPSULOTOMY, WEIL OSTEOTOMY;  Surgeon: Terance Hart, MD;  [facility];  Service: Orthopedics;  Laterality: Left;  LENGTH OF SURGERY: 60 MINUTES   BACK SURGERY     x 3   CARDIAC CATHETERIZATION  11/2008   Dr. Anne Fu - 20% calcified non flow limiting left main, 50% EF apical hypokinesis  CERVICAL DISC ARTHROPLASTY N/A 04/07/2017   Procedure: Cervical six-seven Disc arthroplasty;  Surgeon: Barnett Abu, MD;  Location: [facility] OR;  Service: Neurosurgery;  Laterality: N/A;   CHEST TUBE INSERTION Left 04/11/2017   Procedure: CHEST TUBE INSERTION;  Surgeon: Kerin Perna, MD;  Location: [facility] OR;  Service: Thoracic;  Laterality: Left;   CHOLECYSTECTOMY     COLONOSCOPY  2016   CG-MAC-miralax(good)servere TICS/TA x 2   ESOPHAGEAL MANOMETRY N/A 05/30/2021   Procedure: ESOPHAGEAL MANOMETRY (EM);  Surgeon: Shellia Cleverly, DO;  Location: [facility];  Service: Gastroenterology;  Laterality: N/A;  ph impedence   ESOPHAGOGASTRODUODENOSCOPY     EYE SURGERY Bilateral 2011   steroidal  encapsulation" (04/28/2012)   FOOT SURGERY Right    x 2   JOINT REPLACEMENT     Many surgeries including 5 joit replacements   KNEE ARTHROSCOPY Bilateral    LATERAL / POSTERIOR COMBINED FUSION LUMBAR SPINE  2012   LEFT ATRIAL APPENDAGE OCCLUSION N/A 02/22/2021   Procedure: LEFT ATRIAL APPENDAGE OCCLUSION;  Surgeon: Lanier Prude, MD;  [facility];  Service: Cardiovascular;  Laterality: N/A;   POLYPECTOMY  2016   severe TICS/TA x 2   POSTERIOR CERVICAL LAMINECTOMY WITH MET- RX Right 06/11/2021   Procedure: Right Cervical six-seven  Laminectomy and foraminotomy with metrex;  Surgeon: Barnett Abu, MD;  Location: [facility] OR;  Service: Neurosurgery;  Laterality: Right;   REVERSE SHOULDER ARTHROPLASTY  04/28/2012   Procedure: REVERSE SHOULDER ARTHROPLASTY;  Surgeon: Mable Paris, MD;  Location: [facility] OR;  Service: Orthopedics;  Laterality: Left;  Left shouder reverse total shoulder arthroplasty   SHOULDER SURGERY     SPINE SURGERY     I have had 7 spinal surgeries   TEE WITHOUT CARDIOVERSION N/A 02/22/2021   Procedure: TRANSESOPHAGEAL ECHOCARDIOGRAM (TEE);  Surgeon: Lanier Prude, MD;  Location: [facility] INVASIVE CV LAB;  Service: Cardiovascular;  Laterality: N/A;   TENDON REPAIR Right 08/07/2020   Procedure: RIGHT HAND LIGAMENT RECONSTRUCTION AND TENDON INTERPOSITION;  Surgeon: Jodi Geralds, MD;  Location: [facility];  Service: Orthopedics;  Laterality: Right;   TONSILLECTOMY AND ADENOIDECTOMY  1954   TOTAL HIP ARTHROPLASTY Left 2011   "left" (04/28/2012)   TOTAL HIP ARTHROPLASTY Right 07/31/2015   Procedure: TOTAL HIP ARTHROPLASTY ANTERIOR APPROACH;  Surgeon: Jodi Geralds, MD;  [facility];  Service: Orthopedics;  Laterality: Right;   TOTAL KNEE ARTHROPLASTY  01/10/2012   Procedure: TOTAL KNEE ARTHROPLASTY;  Surgeon: Harvie Junior, MD;  Location: [facility];  Service: Orthopedics;;  left total knee arthroplasty   TOTAL KNEE ARTHROPLASTY Right 06/17/2014    Procedure: TOTAL KNEE ARTHROPLASTY;  Surgeon: Harvie Junior, MD;  Location[facility];  Service: Orthopedics;  Laterality: Right;   toupet fundolplication  11/01/2021   TRANSESOPHAGEAL ECHOCARDIOGRAM (CATH LAB) N/A 03/24/2023   Procedure: TRANSESOPHAGEAL ECHOCARDIOGRAM;  Surgeon: Chrystie Nose, MD;  [facility];  Service: Cardiovascular;  Laterality: N/A;   Patient Active Problem List   Diagnosis Date Noted   Cellulitis 04/17/2023   Closed fracture of ramus of right pubis (HCC) 04/07/2023   S/P PICC central line placement 03/31/2023   Venous insufficiency of both lower extremities 03/20/2023   Chronic superficial venous thrombosis of both lower extremities 11/18/2022   Nausea and vomiting 11/12/2022   Venous stasis ulcer (HCC) 09/13/2022   Primary osteoarthritis of right shoulder, status post left shoulder arthroplasty 07/05/2022   Shingles 05/16/2022   Olecranon bursitis, right elbow 04/04/2022  Vertigo 04/04/2022   Orthostatic hypotension 03/21/2022   BPH (benign prostatic hyperplasia) 02/21/2022   Annual physical exam 01/22/2022   Charcot's joint of foot, right 01/22/2022   Lower extremity edema 08/08/2021   Heartburn    Atypical chest pain    Cervical radicular pain 06/11/2021   Chronic anticoagulation 03/15/2021   Peripheral neuropathy 02/22/2021   Chronic systolic heart failure (HCC) 02/22/2021   Essential (primary) hypertension 11/15/2020   Arthritis of carpometacarpal (CMC) joint of right thumb 08/07/2020   COPD (chronic obstructive pulmonary disease) (HCC) 03/21/2020   GERD (gastroesophageal reflux disease) status post Nissen fundoplication 12/03/2017   Pneumothorax, left 04/11/2017   Lumbar stenosis status post T12-S1 fusion 04/07/2017   Cervical spondylosis with radiculopathy 01/01/2017   Cervical radiculopathy 04/11/2016   History of total right hip arthroplasty 07/31/2015   Chronic low back pain 10/13/2014   Primary osteoarthritis of right knee  06/17/2014   Thrush of mouth and esophagus (HCC) 04/03/2014   Palpitation 03/28/2014   Hyperlipidemia    History of colonic polyps 02/09/2013   Paroxysmal atrial fibrillation (HCC) 05/11/2012   Osteoarthritis of left knee 01/10/2012   Seasonal and perennial allergic rhinitis 07/23/2010   Asthma, moderate persistent 03/06/2009    ONSET DATE: 06/17/2023  REFERRING DIAG: R26.89 (ICD-10-CM) - Balance problems  THERAPY DIAG:  No diagnosis found.  Rationale for Evaluation and Treatment: Rehabilitation  SUBJECTIVE:                                                                                                                                                                                             SUBJECTIVE STATEMENT: *** Pt accompanied by: {accompnied:27141}  PERTINENT HISTORY: ***  PAIN:  Are you having pain? {OPRCPAIN:27236}  PRECAUTIONS: {Therapy precautions:24002}  WEIGHT BEARING RESTRICTIONS: No  FALLS: Has patient fallen in last 6 months? {fallsyesno:27318}  LIVING ENVIRONMENT: Lives with: {[facility] lives with:25569::"lives with their family"} Lives in: {Lives in:25570} Stairs: {opstairs:27293} Has following equipment at home: {Assistive devices:23999}  PLOF: {PLOF:24004}  PATIENT GOALS: ***  OBJECTIVE:  Note: Objective measures were completed at Evaluation unless otherwise noted.  DIAGNOSTIC FINDINGS: ***  COGNITION: Overall cognitive status: {cognition:24006}   SENSATION: {sensation:27233}  COORDINATION: ***  EDEMA:  {edema:24020}  MUSCLE TONE: {LE tone:25568}  MUSCLE LENGTH: Hamstrings: Right *** deg; Left *** deg Maisie Fus test: Right *** deg; Left *** deg  DTRs:  {DTR SITE:24025}  POSTURE: {posture:25561}  LOWER EXTREMITY ROM:    {AROM/PROM:27142}  Right Eval Left Eval  Hip flexion    Hip extension    Hip abduction    Hip adduction    Hip internal rotation  Hip external rotation    Knee flexion    Knee extension    Ankle  dorsiflexion    Ankle plantarflexion    Ankle inversion    Ankle eversion     (Blank rows = not tested)  LOWER EXTREMITY MMT:   MMT Right Eval Left Eval  Hip flexion    Hip extension    Hip abduction    Hip adduction    Hip internal rotation    Hip external rotation    Knee flexion    Knee extension    Ankle dorsiflexion    Ankle plantarflexion    Ankle inversion    Ankle eversion    (Blank rows = not tested)  BED MOBILITY:  {Bed mobility:24027}  TRANSFERS: Assistive device utilized: {Assistive devices:23999}  Sit to stand: {Levels of assistance:24026} Stand to sit: {Levels of assistance:24026} Chair to chair: {Levels of assistance:24026} Floor: {Levels of assistance:24026}  RAMP:  Level of Assistance: {Levels of assistance:24026} Assistive device utilized: {Assistive devices:23999} Ramp Comments: ***  CURB:  Level of Assistance: {Levels of assistance:24026} Assistive device utilized: {Assistive devices:23999} Curb Comments: ***  STAIRS: Level of Assistance: {Levels of assistance:24026} Stair Negotiation Technique: {Stair Technique:27161} with {Rail Assistance:27162} Number of Stairs: ***  Height of Stairs: ***  Comments: ***  GAIT: Gait pattern: {gait characteristics:25376} Distance walked: *** Assistive device utilized: {Assistive devices:23999} Level of assistance: {Levels of assistance:24026} Comments: ***  FUNCTIONAL TESTS:  {Functional tests:24029}  PATIENT SURVEYS:  {rehab surveys:24030}   [facility] Adult PT Treatment:                                                DATE: 06/26/23 Therapeutic Exercise: *** Manual Therapy: *** Neuromuscular re-ed: *** Therapeutic Activity: *** Gait: *** Modalities: *** Self Care: ***    PATIENT EDUCATION: Education details: *** Person educated: {Person educated:25204} Education method: {Education Method:25205} Education comprehension: {Education Comprehension:25206}  HOME EXERCISE  PROGRAM: ***  GOALS: Goals reviewed with patient? {yes/no:20286}  SHORT TERM GOALS: Target date: ***  *** Baseline: Goal status: INITIAL  2.  *** Baseline:  Goal status: INITIAL  3.  *** Baseline:  Goal status: INITIAL  4.  *** Baseline:  Goal status: INITIAL  5.  *** Baseline:  Goal status: INITIAL  6.  *** Baseline:  Goal status: INITIAL  LONG TERM GOALS: Target date: ***  *** Baseline:  Goal status: INITIAL  2.  *** Baseline:  Goal status: INITIAL  3.  *** Baseline:  Goal status: INITIAL  4.  *** Baseline:  Goal status: INITIAL  5.  *** Baseline:  Goal status: INITIAL  6.  *** Baseline:  Goal status: INITIAL  ASSESSMENT:  CLINICAL IMPRESSION: Patient is a 78 y.o. male who was seen today for physical therapy evaluation and treatment for ***.   OBJECTIVE IMPAIRMENTS: {opptimpairments:25111}.   ACTIVITY LIMITATIONS: {activitylimitations:27494}  PARTICIPATION LIMITATIONS: {participationrestrictions:25113}  PERSONAL FACTORS: {Personal factors:25162} are also affecting patient's functional outcome.   REHAB POTENTIAL: {rehabpotential:25112}  CLINICAL DECISION MAKING: {clinical decision making:25114}  EVALUATION COMPLEXITY: {Evaluation complexity:25115}  PLAN:  PT FREQUENCY: {rehab frequency:25116}  PT DURATION: {rehab duration:25117}  PLANNED INTERVENTIONS: {rehab planned interventions:25118::"97110-Therapeutic exercises","97530- Therapeutic [phone]- Neuromuscular re-education","97535- Self GUYQ","03474- Manual therapy"}  PLAN FOR NEXT SESSION: ***   Tabytha Gradillas, PT 06/26/2023, 8:36 AM

## 2023-06-27 ENCOUNTER — Ambulatory Visit (INDEPENDENT_AMBULATORY_CARE_PROVIDER_SITE_OTHER): Payer: Medicare Other | Admitting: Sports Medicine

## 2023-06-27 ENCOUNTER — Ambulatory Visit: Payer: Medicare Other

## 2023-06-27 ENCOUNTER — Encounter: Payer: Self-pay | Admitting: Sports Medicine

## 2023-06-27 VITALS — BP 138/83 | HR 96 | Ht 74.0 in | Wt 194.0 lb

## 2023-06-27 DIAGNOSIS — T8133XD Disruption of traumatic injury wound repair, subsequent encounter: Secondary | ICD-10-CM | POA: Diagnosis not present

## 2023-06-27 DIAGNOSIS — R600 Localized edema: Secondary | ICD-10-CM | POA: Diagnosis not present

## 2023-06-27 DIAGNOSIS — M869 Osteomyelitis, unspecified: Secondary | ICD-10-CM | POA: Diagnosis not present

## 2023-06-27 DIAGNOSIS — J4540 Moderate persistent asthma, uncomplicated: Secondary | ICD-10-CM | POA: Diagnosis not present

## 2023-06-27 DIAGNOSIS — M79674 Pain in right toe(s): Secondary | ICD-10-CM

## 2023-06-27 DIAGNOSIS — Z96612 Presence of left artificial shoulder joint: Secondary | ICD-10-CM | POA: Diagnosis not present

## 2023-06-27 DIAGNOSIS — R9389 Abnormal findings on diagnostic imaging of other specified body structures: Secondary | ICD-10-CM | POA: Diagnosis not present

## 2023-06-27 DIAGNOSIS — M25541 Pain in joints of right hand: Secondary | ICD-10-CM | POA: Diagnosis not present

## 2023-06-27 DIAGNOSIS — I872 Venous insufficiency (chronic) (peripheral): Secondary | ICD-10-CM

## 2023-06-27 DIAGNOSIS — Z981 Arthrodesis status: Secondary | ICD-10-CM | POA: Diagnosis not present

## 2023-06-27 DIAGNOSIS — R059 Cough, unspecified: Secondary | ICD-10-CM | POA: Diagnosis not present

## 2023-06-27 DIAGNOSIS — M7989 Other specified soft tissue disorders: Secondary | ICD-10-CM | POA: Diagnosis not present

## 2023-06-27 DIAGNOSIS — T8133XA Disruption of traumatic injury wound repair, initial encounter: Secondary | ICD-10-CM | POA: Diagnosis not present

## 2023-06-27 MED ORDER — PREDNISONE 50 MG PO TABS: ORAL_TABLET | ORAL | 0 refills | Status: DC

## 2023-06-27 MED ORDER — AZITHROMYCIN 250 MG PO TABS: ORAL_TABLET | ORAL | 0 refills | Status: DC

## 2023-06-27 NOTE — Assessment & Plan Note (Signed)
Currently undergoing wound care left lower extremity at [facility] wound care center, things are going really well, he does have significant edema right lower leg, signs consistent with venous stasis dermatitis, we applied an Unna boot today, I would like wound care to work on his right lower extremity as well.

## 2023-06-27 NOTE — Assessment & Plan Note (Signed)
Increasing pain right fifth toe, there does appear to be a corn over one of the interphalangeal joints. I would like x-rays and I would like him to touch base with podiatry.

## 2023-06-27 NOTE — Progress Notes (Signed)
    Procedures performed today:    Unna boot applied right lower extremity  Independent interpretation of notes and tests performed by another provider:   None.  Brief History, Exam, Impression, and Recommendations:    Asthma, moderate persistent Increasing cough, wheezing right lower lobe, coarse sounds, otherwise stable, no respiratory distress. Treating aggressively, azithromycin, prednisone. Chest x-ray.   Venous insufficiency of both lower extremities Currently undergoing wound care left lower extremity at [facility] wound care center, things are going really well, he does have significant edema right lower leg, signs consistent with venous stasis dermatitis, we applied an Unna boot today, I would like wound care to work on his right lower extremity as well.   Pain in toe of right fifth foot Increasing pain right fifth toe, there does appear to be a corn over one of the interphalangeal joints. I would like x-rays and I would like him to touch base with podiatry.    ____________________________________________ Levi [name]. [name], M.D., ABFM., CAQSM., AME. Primary Care and Sports Medicine [facility]  Adjunct Professor of Family Medicine  [employer]  Restaurant manager, [employer]

## 2023-06-27 NOTE — Assessment & Plan Note (Signed)
Increasing cough, wheezing right lower lobe, coarse sounds, otherwise stable, no respiratory distress. Treating aggressively, azithromycin, prednisone. Chest x-ray.

## 2023-06-30 ENCOUNTER — Telehealth: Payer: Self-pay | Admitting: Cardiology

## 2023-06-30 NOTE — Telephone Encounter (Signed)
Caller (Lauren) stated they received the clearance for patient to hold his Eliquis 3 days prior but want to know if patient can hold his Eliquis for 5-7 days after the procedure.

## 2023-07-01 NOTE — Telephone Encounter (Signed)
 Dr. Jeffrie,  Please provide recommendations on anticoagulation request/hold.  Thank you for your help.  Levi [name]. Ramia Sidney NP-C     07/01/2023, 4:46 PM [facility] [address] Office [phone] Fax [phone]

## 2023-07-01 NOTE — Telephone Encounter (Signed)
 Dr. Jeffrie,  We have received subsequent request for prolonged Eliquis  hold of 5-7 days prior to spinal cord stimulator trial.  Pharmacy has advised holding Eliquis  3 days prior to procedure.  Please provide recommendations.  Thank you for your help.  Please send response to CV DIV preop pool.    Josefa HERO. Bernard Donahoo NP-C     07/01/2023, 3:48 PM [facility] [address] Office [phone] Fax [phone]

## 2023-07-01 NOTE — Telephone Encounter (Signed)
 Requesting office sent another request asking for Eliquis  to be held x 5-7 days. Our office has provided Eliquis  hold time of 3 days. Dr. Darlis is requesting 5-7 days.   July 01, 2023 Tobie Robbi POUR, [facility] to [place], [place]     07/01/23  3:32 PM His renal function is good, 3 days hold would be sufficient if longer than 3 days hold needed, suggest to route to MD for further advise June 30, 2023 [place], [place], [place] to Cv Div Preop Pharm     06/30/23  3:38 PM Pt already been cleared but office is asking to hold Eliquis  longer.  Please advise!   JW   06/30/23  3:06 PM Tanda Needle B routed this conversation to Cv Div Preop Callback Wilson, Jasmin B  JW   06/30/23  3:06 PM Note Caller Sandor) stated they received the clearance for patient to hold his Eliquis  3 days prior but want to know if patient can hold his Eliquis  for 5-7 days after the procedure.

## 2023-07-03 ENCOUNTER — Ambulatory Visit (INDEPENDENT_AMBULATORY_CARE_PROVIDER_SITE_OTHER): Payer: Medicare Other | Admitting: Podiatry

## 2023-07-03 ENCOUNTER — Ambulatory Visit: Payer: Medicare Other

## 2023-07-03 ENCOUNTER — Encounter: Payer: Self-pay | Admitting: Podiatry

## 2023-07-03 ENCOUNTER — Other Ambulatory Visit: Payer: Self-pay | Admitting: Sports Medicine

## 2023-07-03 ENCOUNTER — Other Ambulatory Visit: Payer: Self-pay

## 2023-07-03 DIAGNOSIS — G6289 Other specified polyneuropathies: Secondary | ICD-10-CM

## 2023-07-03 DIAGNOSIS — M79671 Pain in right foot: Secondary | ICD-10-CM | POA: Diagnosis not present

## 2023-07-03 DIAGNOSIS — M19071 Primary osteoarthritis, right ankle and foot: Secondary | ICD-10-CM | POA: Diagnosis not present

## 2023-07-03 DIAGNOSIS — M14671 Charcot's joint, right ankle and foot: Secondary | ICD-10-CM | POA: Diagnosis not present

## 2023-07-03 DIAGNOSIS — R42 Dizziness and giddiness: Secondary | ICD-10-CM

## 2023-07-03 DIAGNOSIS — S90121A Contusion of right lesser toe(s) without damage to nail, initial encounter: Secondary | ICD-10-CM | POA: Diagnosis not present

## 2023-07-03 DIAGNOSIS — M2141 Flat foot [pes planus] (acquired), right foot: Secondary | ICD-10-CM | POA: Diagnosis not present

## 2023-07-03 NOTE — Progress Notes (Signed)
 Subjective:  Patient ID: Levi [name], male    DOB: 11-22-1945,   MRN: [id number]  No chief complaint on file.   78 y.o. male presents for concern of right foot pain. Relates the fifth toe has been flaring up with pain off and on. Was sent by Dr. ONEIDA to evaluate for corn.  Patient has a significant history for charcot and neuropathy. He is not diabetic. He was exposed to agent orange. Relates he has been treated for charcot in the past and had wounds on his foot. He  has a CROW walker and all sorts of custom shoes at home. Currently wearing brooks. He is also currently being treated in wound care for left leg wounds from venous insufficiency. Also relates he is following with neurology and has plans to get a spinal stimulator to help with pains in legs.  Denies any other pedal complaints. Denies n/v/f/c.   Past Medical History:  Diagnosis Date   Agent orange exposure 1970's   takes Imdur ,Diltiazem , and Atacand  daily   Agent orange exposure    ALLERGIC RHINITIS    Allergy     seasonal allergies   Asthma    uses inhaler and nebulizers   Atrial fibrillation (HCC)    Bruises easily    d/t prednisone  daily   Cataract    Cellulitis 03/22/2020   Chest wall pain 09/21/2019   L scapula, reported 4/27 21/     CHF (congestive heart failure) (HCC)    dx-on meds to treat   Chronic bronchitis (HCC)    used to get it q yr; last time ?2008 (04/28/2012)   Clotting disorder (HCC) 2019   On blood thinner when I bleed it is for a consideral amount of time. This is tegardless of size of wound.   COPD (chronic obstructive pulmonary disease) (HCC)    agent orange exposure   Degenerative disk disease    everywhere (04/28/2012)   Diverticulitis    Dysrhythmia    afib   Elevated uric acid in blood    takes Allopurinol  daily   Emphysema of lung (HCC)    Enlarged prostate    but not on any meds   GERD (gastroesophageal reflux disease)    takes Protonix  daily   H/O hiatal hernia    Headache     History of colonoscopy    History of pneumonia    a. 2010   Hyperlipidemia    takes Lipitor daily; pt. states he takes a preventive   Hypothyroidism    Joint pain    Joint swelling    Leukocytosis    MSSA bacteremia 03/19/2023   Nausea and vomiting 11/12/2022   Neuromuscular disorder (HCC)    bilat legs, and bilat arms   Neuropathy    Osteoporosis 2012   PAF (paroxysmal atrial fibrillation) (HCC)    a. Dx 04/2012, CHA2DS2VASc = 1 (age);  b. 04/2012 Echo: EF 40-50%, mild MR.   Personal history of colonic adenomas 02/09/2013   Pneumonia    Sepsis (HCC) 03/18/2023   Shortness of breath dyspnea    Thyroid  disease    no taking meds-caused dizziness    Objective:  Physical Exam: Vascular: DP/PT pulses 2/4 bilateral. CFT <3 seconds. Normal hair growth on digits. No edema.  Skin. No lacerations or abrasions bilateral feet. Left leg wrapped from wound care. Mild eccymosis noted to distal third digit and fifth digit. No open ulceration noted. Mild hyperkeratosis noted to the fifth digit dorsal PIPJ Musculoskeletal: MMT 5/5 bilateral lower  extremities in DF, PF, Inversion and Eversion. Deceased ROM in DF of ankle joint. Severe rocker bottom deformity of right foot with abduction of forefoot. No pain to palpation currently.  Neurological: Sensation intact to light touch.   Assessment:   1. Charcot's joint of foot, right   2. Other polyneuropathy   3. Contusion of lesser toe of right foot without damage to nail, initial encounter      Plan:  Patient was evaluated and treated and all questions answered. Discussed neuropathy and charcot arthropahty and etiology as well as treatment with patient.  X-rays reviewed. No acute fractures or dislocations. Charcot changes and rocker bottom deformity noted in midfoot.  Radiographs reviewed and discussed with patient. No concern for any fracture likely a contusion of the toes but appears to be improving.  -Discussed and educated patient on  foot  care, especially with  regards to the vascular, neurological and musculoskeletal systems. -Minimal hyperkeratosis debrided to dorsum of fifth digit on right without incident as courtesy.   -Discussed supportive shoes at all times and checking feet regularly. Discussed charcot flares and if foot gets swollen red and more painful to return to his boot/CROW walker and minimize weight bearing to minimize deformity.  -Discussed with patient this is a chronic condition that will need continual management and monitoring with X-rays for changes in bone structure. He did express understanding.  -Follow-up for spinal cord stimulator which will hopefully help a lot of his current pain.  -Patient to return in 3 months for follow-up evaluation.    Asberry Failing, DPM

## 2023-07-04 DIAGNOSIS — R600 Localized edema: Secondary | ICD-10-CM | POA: Diagnosis not present

## 2023-07-04 DIAGNOSIS — I872 Venous insufficiency (chronic) (peripheral): Secondary | ICD-10-CM | POA: Diagnosis not present

## 2023-07-04 DIAGNOSIS — T8133XD Disruption of traumatic injury wound repair, subsequent encounter: Secondary | ICD-10-CM | POA: Diagnosis not present

## 2023-07-04 DIAGNOSIS — T8133XA Disruption of traumatic injury wound repair, initial encounter: Secondary | ICD-10-CM | POA: Diagnosis not present

## 2023-07-07 DIAGNOSIS — H26493 Other secondary cataract, bilateral: Secondary | ICD-10-CM | POA: Diagnosis not present

## 2023-07-07 HISTORY — PX: REFRACTIVE SURGERY: SHX103

## 2023-07-07 NOTE — Telephone Encounter (Signed)
   Primary Cardiologist: Dorothye Gathers, MD  Chart reviewed as part of pre-operative protocol coverage. Given past medical history and time since last visit, based on ACC/AHA guidelines, Levi [name] would be at acceptable risk for the planned procedure without further cardiovascular testing.   Per office protocol, pt may hold Eliquis  for 3 days prior to procedure and may hold for 5-7 days post procedure. He should resume Eliquis  as soon as hemodynamically stable.   I will route this recommendation to the requesting party via Epic fax function and remove from pre-op pool.  Please call with questions.  Gerldine Koch, NP-C 07/07/2023, 8:05 AM [address], [address] Office [phone] Fax [phone]

## 2023-07-08 ENCOUNTER — Telehealth: Payer: Self-pay | Admitting: Cardiology

## 2023-07-08 NOTE — Telephone Encounter (Signed)
Spoke with pt who is reporting he feels as if since he was changed to Metoprolol his feet are "alittle more swollen"  He reports they are always swollen but seem alittle more so.  He has knee high compression stockings but he is not wearing them.  Reports he does try to keep his feet and legs elevated during the day.  Diet is the same.  Has not been weighing daily.  He a/so c/o alittle SOB than is not constant.  Seems worse when sitting still or than with activity.  Reports recently having had bronchitis.  Advised to contact PCP for further evaluation given recent bronchitis.  Advised to monitor sodium and fluid intake and to weigh daily and keep a record of it.  Advised to call back if wt up 3 lbs overnight or 5 lbs in a week.  Pt states understanding and was appreciative of the call back and information.

## 2023-07-08 NOTE — Telephone Encounter (Signed)
Pt c/o medication issue:  1. Name of Medication: metoprolol succinate (TOPROL-XL) 50 MG 24 hr tablet   2. How are you currently taking this medication (dosage and times per day)? 50 mg, Daily   3. Are you having a reaction (difficulty breathing--STAT)? SOB  4. What is your medication issue? Since starting med pt has been having sob, requesting cb, feels like it has also worsened his already edema

## 2023-07-09 DIAGNOSIS — Z9889 Other specified postprocedural states: Secondary | ICD-10-CM | POA: Diagnosis not present

## 2023-07-09 DIAGNOSIS — M79604 Pain in right leg: Secondary | ICD-10-CM | POA: Diagnosis not present

## 2023-07-11 DIAGNOSIS — T8133XD Disruption of traumatic injury wound repair, subsequent encounter: Secondary | ICD-10-CM | POA: Diagnosis not present

## 2023-07-11 DIAGNOSIS — I872 Venous insufficiency (chronic) (peripheral): Secondary | ICD-10-CM | POA: Diagnosis not present

## 2023-07-11 DIAGNOSIS — R600 Localized edema: Secondary | ICD-10-CM | POA: Diagnosis not present

## 2023-07-11 DIAGNOSIS — S81802A Unspecified open wound, left lower leg, initial encounter: Secondary | ICD-10-CM | POA: Diagnosis not present

## 2023-07-16 ENCOUNTER — Ambulatory Visit: Payer: Medicare Other | Admitting: Rehabilitative and Restorative Service Providers"

## 2023-07-18 ENCOUNTER — Telehealth: Payer: Self-pay | Admitting: Cardiology

## 2023-07-18 DIAGNOSIS — T8133XD Disruption of traumatic injury wound repair, subsequent encounter: Secondary | ICD-10-CM | POA: Diagnosis not present

## 2023-07-18 DIAGNOSIS — R600 Localized edema: Secondary | ICD-10-CM | POA: Diagnosis not present

## 2023-07-18 DIAGNOSIS — I83029 Varicose veins of left lower extremity with ulcer of unspecified site: Secondary | ICD-10-CM | POA: Diagnosis not present

## 2023-07-18 DIAGNOSIS — S81802A Unspecified open wound, left lower leg, initial encounter: Secondary | ICD-10-CM | POA: Diagnosis not present

## 2023-07-18 DIAGNOSIS — I872 Venous insufficiency (chronic) (peripheral): Secondary | ICD-10-CM | POA: Diagnosis not present

## 2023-07-18 DIAGNOSIS — L97929 Non-pressure chronic ulcer of unspecified part of left lower leg with unspecified severity: Secondary | ICD-10-CM | POA: Diagnosis not present

## 2023-07-18 NOTE — Telephone Encounter (Signed)
   Patient Name: Levi [name]  DOB: Jun 04, 1945 MRN: [id number]  Primary Cardiologist: Donato Schultz, MD  Chart reviewed as part of pre-operative protocol coverage. Given past medical history and time since last visit, based on ACC/AHA guidelines, Levi [name] is at acceptable risk for the planned procedure without further cardiovascular testing.   The patient was advised that if he develops new symptoms prior to surgery to contact our office to arrange for a follow-up visit, and he verbalized understanding.  Okay to hold Eliquis for 3 days prior to the procedure. See notes from 07/07/2023 as he was cleared 10 days ago for temporary implantation of stimulator.   I will route this recommendation to the requesting party via Epic fax function and remove from pre-op pool.  Please call with questions.  Joni Reining, NP 07/18/2023, 10:52 AM

## 2023-07-18 NOTE — Telephone Encounter (Signed)
   Pre-operative Risk Assessment    Patient Name: Levi [name]  DOB: 25-May-1946 MRN: [id number]   Date of last office visit: 10/25/2022 Date of next office visit: 10/21/2023   Request for Surgical Clearance    Procedure:   Salome Holmes. Spinal Cord Stimulator placement  Date of Surgery:  Clearance TBD                                Surgeon:  Dr. Wilkie Aye  Surgeon's Group or Practice Name:  [facility] Phone number:  [phone] ext: [phone] Fax number:  [phone] attn: Lowella Bandy   Type of Clearance Requested:   - Medical  - Pharmacy:  Hold Apixaban (Eliquis) Need instructions   Type of Anesthesia:  General    Additional requests/questions:   Was cleared for trial stimulator earlier this month  Signed, Royann Shivers   07/18/2023, 10:16 AM

## 2023-07-21 ENCOUNTER — Other Ambulatory Visit: Payer: Self-pay | Admitting: Pain Medicine

## 2023-07-21 DIAGNOSIS — I8393 Asymptomatic varicose veins of bilateral lower extremities: Secondary | ICD-10-CM | POA: Diagnosis not present

## 2023-07-21 DIAGNOSIS — L98499 Non-pressure chronic ulcer of skin of other sites with unspecified severity: Secondary | ICD-10-CM | POA: Diagnosis not present

## 2023-07-21 DIAGNOSIS — I872 Venous insufficiency (chronic) (peripheral): Secondary | ICD-10-CM | POA: Diagnosis not present

## 2023-07-23 ENCOUNTER — Ambulatory Visit: Payer: Medicare Other | Attending: Sports Medicine | Admitting: Physical Therapy

## 2023-07-23 ENCOUNTER — Encounter: Payer: Self-pay | Admitting: Physical Therapy

## 2023-07-23 ENCOUNTER — Other Ambulatory Visit: Payer: Self-pay

## 2023-07-23 DIAGNOSIS — R262 Difficulty in walking, not elsewhere classified: Secondary | ICD-10-CM | POA: Insufficient documentation

## 2023-07-23 DIAGNOSIS — R2689 Other abnormalities of gait and mobility: Secondary | ICD-10-CM | POA: Insufficient documentation

## 2023-07-23 DIAGNOSIS — M6281 Muscle weakness (generalized): Secondary | ICD-10-CM | POA: Diagnosis not present

## 2023-07-23 NOTE — Therapy (Signed)
 OUTPATIENT PHYSICAL THERAPY NEURO EVALUATION   Patient Name: Levi [name] MRN: [id number] DOB:03-17-1946, 78 y.o., male Today's Date: 07/23/2023   PCP: Venita Sheffield REFERRING PROVIDER: Venita Sheffield  END OF SESSION:  PT End of Session - 07/23/23 1433     Visit Number 1    Number of Visits 16    Date for PT Re-Evaluation 09/17/23    Authorization Type Medicare    Authorization - Visit Number 1    Progress Note Due on Visit 10    PT Start Time 1100    PT Stop Time 1140    PT Time Calculation (min) 40 min    Activity Tolerance Patient tolerated treatment well    Behavior During Therapy WFL for tasks assessed/performed             Past Medical History:  Diagnosis Date   Agent orange exposure 1970's   takes Imdur,Diltiazem, and Atacand daily   Agent orange exposure    ALLERGIC RHINITIS    Allergy    seasonal allergies   Asthma    uses inhaler and nebulizers   Atrial fibrillation (HCC)    Bruises easily    d/t prednisone daily   Cataract    Cellulitis 03/22/2020   Chest wall pain 09/21/2019   L scapula, reported 4/27 21/     CHF (congestive heart failure) (HCC)    dx-on meds to treat   Chronic bronchitis (HCC)    "used to get it q yr; last time ?2008" (04/28/2012)   Clotting disorder (HCC) 2019   On blood thinner when I bleed it is for a consideral amount of time. This is tegardless of size of wound.   COPD (chronic obstructive pulmonary disease) (HCC)    agent orange exposure   Degenerative disk disease    "everywhere" (04/28/2012)   Diverticulitis    Dysrhythmia    afib   Elevated uric acid in blood    takes Allopurinol daily   Emphysema of lung (HCC)    Enlarged prostate    but not on any meds   GERD (gastroesophageal reflux disease)    takes Protonix daily   H/O hiatal hernia    Headache    History of colonoscopy    History of pneumonia    a. 2010   Hyperlipidemia    takes Lipitor daily; pt. states he takes a preventive    Hypothyroidism    Joint pain    Joint swelling    Leukocytosis    MSSA bacteremia 03/19/2023   Nausea and vomiting 11/12/2022   Neuromuscular disorder (HCC)    bilat legs, and bilat arms   Neuropathy    Osteoporosis 2012   PAF (paroxysmal atrial fibrillation) (HCC)    a. Dx 04/2012, CHA2DS2VASc = 1 (age);  b. 04/2012 Echo: EF 40-50%, mild MR.   Personal history of colonic adenomas 02/09/2013   Pneumonia    Sepsis (HCC) 03/18/2023   Shortness of breath dyspnea    Thyroid disease    no taking meds-caused dizziness   Past Surgical History:  Procedure Laterality Date   98 HOUR PH STUDY N/A 05/30/2021   Procedure: 24 HOUR PH STUDY;  Surgeon: Shellia Cleverly, DO;  Location: [facility];  Service: Gastroenterology;  Laterality: N/A;   ANTERIOR CERVICAL DECOMP/DISCECTOMY FUSION N/A 07/21/2012   Procedure: ANTERIOR CERVICAL DECOMPRESSION/DISCECTOMY FUSION 2 LEVELS;  Surgeon: Barnett Abu, MD;  Location: [facility];  Service: Neurosurgery;  Laterality: N/A;  C4-5 C5-6 Anterior cervical decompression/diskectomy/fusion  ANTERIOR LAT LUMBAR FUSION N/A 04/07/2017   Procedure: Thoracic twelve-Lumbar one Anterolateral decompression/fusion;  Surgeon: Barnett Abu, MD;  Location: [facility] OR;  Service: Neurosurgery;  Laterality: N/A;   ARTHRODESIS FOOT WITH WEIL OSTEOTOMY Left 11/07/2022   Procedure: LEFT THIRD METATARSOPHALANGEAL CAPSULOTOMY, WEIL OSTEOTOMY;  Surgeon: Terance Hart, MD;  Location: [facility] OR;  Service: Orthopedics;  Laterality: Left;  LENGTH OF SURGERY: 60 MINUTES   BACK SURGERY     x 3   CARDIAC CATHETERIZATION  11/2008   Dr. Anne Fu - 20% calcified non flow limiting left main, 50% EF apical hypokinesis   CERVICAL DISC ARTHROPLASTY N/A 04/07/2017   Procedure: Cervical six-seven Disc arthroplasty;  Surgeon: Barnett Abu, MD;  Location: [facility] OR;  Service: Neurosurgery;  Laterality: N/A;   CHEST TUBE INSERTION Left 04/11/2017   Procedure: CHEST TUBE INSERTION;  Surgeon: Kerin Perna, MD;  Location: [facility] OR;  Service: Thoracic;  Laterality: Left;   CHOLECYSTECTOMY     COLONOSCOPY  2016   CG-MAC-miralax(good)servere TICS/TA x 2   ESOPHAGEAL MANOMETRY N/A 05/30/2021   Procedure: ESOPHAGEAL MANOMETRY (EM);  Surgeon: Shellia Cleverly, DO;  Location: [facility];  Service: Gastroenterology;  Laterality: N/A;  ph impedence   ESOPHAGOGASTRODUODENOSCOPY     EYE SURGERY Bilateral 2011   steroidal encapsulation" (04/28/2012)   FOOT SURGERY Right    x 2   JOINT REPLACEMENT     Many surgeries including 5 joit replacements   KNEE ARTHROSCOPY Bilateral    LATERAL / POSTERIOR COMBINED FUSION LUMBAR SPINE  2012   LEFT ATRIAL APPENDAGE OCCLUSION N/A 02/22/2021   Procedure: LEFT ATRIAL APPENDAGE OCCLUSION;  Surgeon: Lanier Prude, MD;  [facility];  Service: Cardiovascular;  Laterality: N/A;   POLYPECTOMY  2016   severe TICS/TA x 2   POSTERIOR CERVICAL LAMINECTOMY WITH MET- RX Right 06/11/2021   Procedure: Right Cervical six-seven  Laminectomy and foraminotomy with metrex;  Surgeon: Barnett Abu, MD;  Location: [facility] OR;  Service: Neurosurgery;  Laterality: Right;   REVERSE SHOULDER ARTHROPLASTY  04/28/2012   Procedure: REVERSE SHOULDER ARTHROPLASTY;  Surgeon: Mable Paris, MD;  Location: [facility] OR;  Service: Orthopedics;  Laterality: Left;  Left shouder reverse total shoulder arthroplasty   SHOULDER SURGERY     SPINE SURGERY     I have had 7 spinal surgeries   TEE WITHOUT CARDIOVERSION N/A 02/22/2021   Procedure: TRANSESOPHAGEAL ECHOCARDIOGRAM (TEE);  Surgeon: Lanier Prude, MD;  Location: [facility] INVASIVE CV LAB;  Service: Cardiovascular;  Laterality: N/A;   TENDON REPAIR Right 08/07/2020   Procedure: RIGHT HAND LIGAMENT RECONSTRUCTION AND TENDON INTERPOSITION;  Surgeon: Jodi Geralds, MD;  Location: [facility];  Service: Orthopedics;  Laterality: Right;   TONSILLECTOMY AND ADENOIDECTOMY  1954   TOTAL HIP ARTHROPLASTY Left 2011    "left" (04/28/2012)   TOTAL HIP ARTHROPLASTY Right 07/31/2015   Procedure: TOTAL HIP ARTHROPLASTY ANTERIOR APPROACH;  Surgeon: Jodi Geralds, MD;  Location: [facility];  Service: Orthopedics;  Laterality: Right;   TOTAL KNEE ARTHROPLASTY  01/10/2012   Procedure: TOTAL KNEE ARTHROPLASTY;  Surgeon: Harvie Junior, MD;  Location: [facility] OR;  Service: Orthopedics;;  left total knee arthroplasty   TOTAL KNEE ARTHROPLASTY Right 06/17/2014   Procedure: TOTAL KNEE ARTHROPLASTY;  Surgeon: Harvie Junior, MD;  Location: [facility] OR;  Service: Orthopedics;  Laterality: Right;   toupet fundolplication  11/01/2021   TRANSESOPHAGEAL ECHOCARDIOGRAM (CATH LAB) N/A 03/24/2023   Procedure: TRANSESOPHAGEAL ECHOCARDIOGRAM;  Surgeon: Chrystie Nose, MD;  [facility]: [facility];  Service: Cardiovascular;  Laterality: N/A;   Patient Active Problem List   Diagnosis Date Noted   Pain in toe of right fifth foot 06/27/2023   Cellulitis 04/17/2023   Closed fracture of ramus of right pubis (HCC) 04/07/2023   S/P PICC central line placement 03/31/2023   Venous insufficiency of both lower extremities 03/20/2023   Chronic superficial venous thrombosis of both lower extremities 11/18/2022   Nausea and vomiting 11/12/2022   Venous stasis ulcer (HCC) 09/13/2022   Primary osteoarthritis of right shoulder, status post left shoulder arthroplasty 07/05/2022   Shingles 05/16/2022   Olecranon bursitis, right elbow 04/04/2022   Vertigo 04/04/2022   Orthostatic hypotension 03/21/2022   BPH (benign prostatic hyperplasia) 02/21/2022   Annual physical exam 01/22/2022   Charcot's joint of foot, right 01/22/2022   Lower extremity edema 08/08/2021   Heartburn    Atypical chest pain    Cervical radicular pain 06/11/2021   Chronic anticoagulation 03/15/2021   Peripheral neuropathy 02/22/2021   Chronic systolic heart failure (HCC) 02/22/2021   Essential (primary) hypertension 11/15/2020   Arthritis of carpometacarpal (CMC) joint of right  thumb 08/07/2020   COPD (chronic obstructive pulmonary disease) (HCC) 03/21/2020   GERD (gastroesophageal reflux disease) status post Nissen fundoplication 12/03/2017   Pneumothorax, left 04/11/2017   Lumbar stenosis status post T12-S1 fusion 04/07/2017   Cervical spondylosis with radiculopathy 01/01/2017   Cervical radiculopathy 04/11/2016   History of total right hip arthroplasty 07/31/2015   Chronic low back pain 10/13/2014   Primary osteoarthritis of right knee 06/17/2014   Thrush of mouth and esophagus (HCC) 04/03/2014   Palpitation 03/28/2014   Hyperlipidemia    History of colonic polyps 02/09/2013   Paroxysmal atrial fibrillation (HCC) 05/11/2012   Osteoarthritis of left knee 01/10/2012   Seasonal and perennial allergic rhinitis 07/23/2010   Asthma, moderate persistent 03/06/2009    ONSET DATE: "years" MD visit January 2025  REFERRING DIAG: balance problems  THERAPY DIAG:  Muscle weakness (generalized)  Difficulty in walking, not elsewhere classified  Other abnormalities of gait and mobility  Rationale for Evaluation and Treatment: Rehabilitation  SUBJECTIVE:                                                                                                                                                                                             SUBJECTIVE STATEMENT: Pt has had peripheral neuropathy for years. He uses a cane for ambulation and reports frequent falls. He reports he is sometimes able to get up after a fall but occasional requires assistance. He reports he has broken his hips, his ribs and  his wrist due to falls. Pt reports he falls when his leg just "gives out".  Pt accompanied by: self  PERTINENT HISTORY: bilat hip and knee replacements, Rt charcot foot. 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  Pt reports "aches and pains all over". He does report Rt foot pain 7/10 due to charcot  foot  PRECAUTIONS: Fall Pt having spinal cord stimulator placed 08/08/23  RED FLAGS: None   WEIGHT BEARING RESTRICTIONS: No  FALLS: Has patient fallen in last 6 months? Yes. Number of falls exact number unknown. Pt reports he falls often  LIVING ENVIRONMENT: Lives with: lives with their family Lives in: House/apartment Stairs: No Has following equipment at home: Single point cane  PLOF: Independent with community mobility with device  PATIENT GOALS: reduce falls  OBJECTIVE:  Note: Objective measures were completed at Evaluation unless otherwise noted.    SENSATION: Bilat LE neuropathy    LOWER EXTREMITY MMT:   All tested in sitting MMT Right Eval Left Eval  Hip flexion 3- 3-  Hip extension    Hip abduction 3 3  Hip adduction 3+ 3+  Hip internal rotation    Hip external rotation    Knee flexion 3+ 3-  Knee extension 4 4  Ankle dorsiflexion 3 3  Ankle plantarflexion    Ankle inversion    Ankle eversion    (Blank rows = not tested)   GAIT: Gait pattern: antalgic and wide BOS  Assistive device utilized: Single point cane and performed with SPC and without AD Level of assistance: Modified independence   FUNCTIONAL TESTS:  Timed up and go (TUG): 14.23 seconds without AD Berg Balance Scale: 19/56 BERG BALANCE TEST Sitting to Standing: 2.      Stands using hands after multiple trials Standing Unsupported: 3.      Stands 2 minutes with supervision Sitting Unsupported: 4.     Sits for 2 minutes independently Standing to Sitting: 1.     Sits independently, but uncontrolled descent  Transfers: 3.     Transfers safely definite use of hands Standing with eyes closed: 1.     Unable to keep eyes closed for 3 seconds, but is safe Standing with feet together: 0.     Needs help to attain position and unable to hold for 15 seconds Reaching forward with outstretched arm: 1.     Reaches forward with supervision Retrieving object from the floor: 3.     Able to pick up  with supervision Turning to look behind: 0.     Needs assistance Turning 360 degrees: 0.     Needs assistance Place alternate foot on stool: 1.     Completes >2 steps with minimal assist Standing with one foot in front: 0.     Loses balance while standing/stepping Standing on one foot: 0.     Unable  Total Score: 19/56  TREATMENT DATE: 07/23/23 See HEP PT educated pt on PT POC and goals, rationale for treatment    PATIENT EDUCATION: Education details: PT POC and goals, HEP Person educated: Patient Education method: Explanation, Demonstration, and Handouts Education comprehension: verbalized understanding and returned demonstration  HOME EXERCISE PROGRAM: Access Code: [email or web address] URL: [email or web address] Date: 07/23/2023 Prepared by: Reggy [facility]  Exercises - Standing Hip Abduction with Counter Support  - 1 x daily - 7 x weekly - 3 sets - 10 reps - Standing Knee Flexion with Counter Support  - 1 x daily - 7 x weekly - 3 sets - 10 reps - Mini Squat with Counter Support  - 1 x daily - 7 x weekly - 3 sets - 10 reps - Heel Toe Raises with Counter Support  - 1 x daily - 7 x weekly - 3 sets - 10 reps - Narrow Stance with Counter Support  - 1 x daily - 7 x weekly - 1 sets - 3 reps - 30 seconds hold  GOALS: Goals reviewed with patient? Yes  SHORT TERM GOALS: Target date: 08/20/2023    Pt will be independent with initial HEP Baseline: Goal status: INITIAL  2.  Pt will improve TUG to <= 13 seconds Baseline:  Goal status: INITIAL    LONG TERM GOALS: Target date: 09/17/2023    Pt will be independent in advanced HEP Baseline:  Goal status: INITIAL  2.  Pt will improve BERG balance test to >= 29/56 to demo decreased risk of falls Baseline:  Goal status: INITIAL  3.  Pt will perform floor to chair transfer with  supervision Baseline:  Goal status: INITIAL  4.  Pt will improve LE strength to 4/5 bilat Baseline:  Goal status: INITIAL    ASSESSMENT:  CLINICAL IMPRESSION: Patient is a 78 y.o. male who was seen today for physical therapy evaluation and treatment for balance deficits. He presents with decreased strength, decreased gait speed, impaired balance, increased risk of falls. Pt will benefit from skilled PT to address deficits and improve safety with functional mobility and reduce fall risk.   OBJECTIVE IMPAIRMENTS: Abnormal gait, decreased activity tolerance, decreased balance, difficulty walking, and decreased strength.   ACTIVITY LIMITATIONS: stairs, transfers, and locomotion level  PARTICIPATION LIMITATIONS: shopping and community activity  PERSONAL FACTORS: 3+ comorbidities: neuropathy, charcot foot, multiple joint replacements  are also affecting patient's functional outcome.   REHAB POTENTIAL: Good  CLINICAL DECISION MAKING: Evolving/moderate complexity  EVALUATION COMPLEXITY: Moderate  PLAN:  PT FREQUENCY: 2x/week  PT DURATION: 8 weeks  PLANNED INTERVENTIONS: 97164- PT Re-evaluation, 97110-Therapeutic exercises, 97530- Therapeutic activity, [id number]- Neuromuscular re-education, 97535- Self Care, 16109- Manual therapy, [id number]- Gait training, [phone]- Aquatic Therapy, Patient/Family education, Balance training, and Taping  PLAN FOR NEXT SESSION: assess response to HEP, balance, gait, floor transfers   Truly Stankiewicz, PT 07/23/2023, 2:34 PM

## 2023-07-24 ENCOUNTER — Telehealth: Payer: Self-pay | Admitting: Sports Medicine

## 2023-07-24 ENCOUNTER — Ambulatory Visit: Payer: Medicare Other

## 2023-07-24 ENCOUNTER — Ambulatory Visit (INDEPENDENT_AMBULATORY_CARE_PROVIDER_SITE_OTHER): Payer: Medicare Other | Admitting: Podiatry

## 2023-07-24 ENCOUNTER — Encounter: Payer: Self-pay | Admitting: Podiatry

## 2023-07-24 DIAGNOSIS — I872 Venous insufficiency (chronic) (peripheral): Secondary | ICD-10-CM

## 2023-07-24 DIAGNOSIS — S91109A Unspecified open wound of unspecified toe(s) without damage to nail, initial encounter: Secondary | ICD-10-CM | POA: Diagnosis not present

## 2023-07-24 DIAGNOSIS — L97522 Non-pressure chronic ulcer of other part of left foot with fat layer exposed: Secondary | ICD-10-CM

## 2023-07-24 DIAGNOSIS — M19072 Primary osteoarthritis, left ankle and foot: Secondary | ICD-10-CM | POA: Diagnosis not present

## 2023-07-24 DIAGNOSIS — S91102A Unspecified open wound of left great toe without damage to nail, initial encounter: Secondary | ICD-10-CM

## 2023-07-24 NOTE — Telephone Encounter (Signed)
-----   Message -----      From:Alison Lawson Radar      Sent:07/24/2023 12:54 PM EST        ZO:[id number] Jillann Charette   Subject:Jack's appointment tomorrow  Hi, Dr [name] -- I hope all is well and that you, like me, are enjoying the warmer weather and becoming hopeful that spring is actually on the way. [name] has an appointment with you tomorrow morning.  I don't know if I will be there, but in any event, what I want to tell you now I wouldn't say in front of him anyway. A while ago (6 weeks-ish?) [name] had an appointment with Dr Jacquenette Shone (the geriatic doctor) and it was determined that Jack's iron count was very low.  Pretty sure she put him on iron pills for that.  She also ordered one of the occult blood test things, and asked him about a million times if he ever noticed blood in his stool; he said no.  So the results come back positive for occult blood, and subsequently [name] told me that there had been blood in his stool.  We never had a follow up from Dr Sharyne Peach office and [name] was not inclined to pursue it what with everything else that's going on.  I asked him a few days ago if there was bleeding and he gave me a non-answer, so I suspect there is.  So I am asking if you could bring it up tomorrow, maybe as if you had noticed it in reviewing his chart and were following up?  I really hate to open up yet another can of worms, but this could be serious and need attention sooner rather than later.   I'm sorry to trouble you about this, and in so many words no less.  Thank you for listening. Jill Side

## 2023-07-24 NOTE — Progress Notes (Signed)
 Subjective:  Patient ID: Levi [name], male    DOB: May 04, 1946,   MRN: [id number]  No chief complaint on file.   78 y.o. male presents for new concern of left second toe wound. Was seen in wound care and noted to have opening of the left second toe wound and concern and was advised to follow-up with [place]. Patient relates some pain in the toe. Relates concern for left great toe as well. He has not been dressing currently. He is currently on bactrim and azithromycin.   Denies any other pedal complaints. Denies n/v/f/c.   Past Medical History:  Diagnosis Date   Agent orange exposure 1970's   takes Imdur,Diltiazem, and Atacand daily   Agent orange exposure    ALLERGIC RHINITIS    Allergy    seasonal allergies   Asthma    uses inhaler and nebulizers   Atrial fibrillation (HCC)    Bruises easily    d/t prednisone daily   Cataract    Cellulitis 03/22/2020   Chest wall pain 09/21/2019   L scapula, reported 4/27 21/     CHF (congestive heart failure) (HCC)    dx-on meds to treat   Chronic bronchitis (HCC)    "used to get it q yr; last time ?2008" (04/28/2012)   Clotting disorder (HCC) 2019   On blood thinner when I bleed it is for a consideral amount of time. This is tegardless of size of wound.   COPD (chronic obstructive pulmonary disease) (HCC)    agent orange exposure   Degenerative disk disease    "everywhere" (04/28/2012)   Diverticulitis    Dysrhythmia    afib   Elevated uric acid in blood    takes Allopurinol daily   Emphysema of lung (HCC)    Enlarged prostate    but not on any meds   GERD (gastroesophageal reflux disease)    takes Protonix daily   H/O hiatal hernia    Headache    History of colonoscopy    History of pneumonia    a. 2010   Hyperlipidemia    takes Lipitor daily; pt. states he takes a preventive   Hypothyroidism    Joint pain    Joint swelling    Leukocytosis    MSSA bacteremia 03/19/2023   Nausea and vomiting 11/12/2022   Neuromuscular disorder  (HCC)    bilat legs, and bilat arms   Neuropathy    Osteoporosis 2012   PAF (paroxysmal atrial fibrillation) (HCC)    a. Dx 04/2012, CHA2DS2VASc = 1 (age);  b. 04/2012 Echo: EF 40-50%, mild MR.   Personal history of colonic adenomas 02/09/2013   Pneumonia    Sepsis (HCC) 03/18/2023   Shortness of breath dyspnea    Thyroid disease    no taking meds-caused dizziness    Objective:  Physical Exam: Vascular: DP/PT pulses 2/4 bilateral. CFT <3 seconds. Normal hair growth on digits. No edema.  Skin. No lacerations or abrasions bilateral feet. Left leg wrapped from wound care. Mild eccymosis noted to distal third digit and fifth digit. Open ulceration noted to dorsum of left second digit about 0.5 cm x 0.4 cm x 0.2 cm with tendon exposed.  Musculoskeletal: MMT 5/5 bilateral lower extremities in DF, PF, Inversion and Eversion. Deceased ROM in DF of ankle joint. Severe rocker bottom deformity of right foot with abduction of forefoot. No pain to palpation currently.  Neurological: Sensation intact to light touch.   Assessment:   1. Skin ulcer of left  great toe with fat layer exposed (HCC)   2. Venous insufficiency of both lower extremities      Plan:  Patient was evaluated and treated and all questions answered. Discussed neuropathy and charcot arthropahty and etiology as well as treatment with patient.  X-rays reviewed. No acute fractures or dislocations. No osseous erosions noted.  Ulcer left second digit with fat layer exposed  -Debridement as below. -Dressed with betadine, DSD. Will use medihoney at home.  -Off-loading with surgical shoe. He has one at home he will use.  -Currently taking bactrim and following with ID for sepsis and left leg cellulitis.  -Discussed glucose control and proper protein-rich diet.  -Discussed if any worsening redness, pain, fever or chills to call or may need to report to the emergency room. Patient expressed understanding.   Procedure: Excisional  Debridement of Wound Rationale: Removal of non-viable soft tissue from the wound to promote healing.  Anesthesia: none Pre-Debridement Wound Measurements: Overlying slough and hyperkeratosis   Post-Debridement Wound Measurements: 0.5 cm x 0.4 cm x 0.2 cm  Type of Debridement: Sharp Excisional Tissue Removed: Non-viable soft tissue Depth of Debridement: subcutaneous tissue. Technique: Sharp excisional debridement to bleeding, viable wound base.  Dressing: Dry, sterile, compression dressing. Disposition: Patient tolerated procedure well. Patient to return in 2 week for follow-up.  Return in about 2 weeks (around 08/07/2023) for wound check.    Louann Sjogren, DPM

## 2023-07-25 ENCOUNTER — Ambulatory Visit (INDEPENDENT_AMBULATORY_CARE_PROVIDER_SITE_OTHER): Payer: Medicare Other | Admitting: Sports Medicine

## 2023-07-25 DIAGNOSIS — S81802A Unspecified open wound, left lower leg, initial encounter: Secondary | ICD-10-CM | POA: Diagnosis not present

## 2023-07-25 DIAGNOSIS — T8133XD Disruption of traumatic injury wound repair, subsequent encounter: Secondary | ICD-10-CM | POA: Diagnosis not present

## 2023-07-25 DIAGNOSIS — I83029 Varicose veins of left lower extremity with ulcer of unspecified site: Secondary | ICD-10-CM | POA: Diagnosis not present

## 2023-07-25 DIAGNOSIS — M79674 Pain in right toe(s): Secondary | ICD-10-CM | POA: Diagnosis not present

## 2023-07-25 DIAGNOSIS — I872 Venous insufficiency (chronic) (peripheral): Secondary | ICD-10-CM

## 2023-07-25 DIAGNOSIS — K219 Gastro-esophageal reflux disease without esophagitis: Secondary | ICD-10-CM

## 2023-07-25 DIAGNOSIS — K921 Melena: Secondary | ICD-10-CM | POA: Diagnosis not present

## 2023-07-25 DIAGNOSIS — L97202 Non-pressure chronic ulcer of unspecified calf with fat layer exposed: Secondary | ICD-10-CM | POA: Diagnosis not present

## 2023-07-25 DIAGNOSIS — L97929 Non-pressure chronic ulcer of unspecified part of left lower leg with unspecified severity: Secondary | ICD-10-CM | POA: Diagnosis not present

## 2023-07-25 DIAGNOSIS — I83002 Varicose veins of unspecified lower extremity with ulcer of calf: Secondary | ICD-10-CM | POA: Diagnosis not present

## 2023-07-25 DIAGNOSIS — R600 Localized edema: Secondary | ICD-10-CM | POA: Diagnosis not present

## 2023-07-25 LAB — HEMOCCULT GUIAC POC 1CARD (OFFICE): Fecal Occult Blood, POC: NEGATIVE

## 2023-07-25 NOTE — Assessment & Plan Note (Signed)
 Levi [name] is now plugged in with podiatry, we will watch from afar.

## 2023-07-25 NOTE — Assessment & Plan Note (Addendum)
 Per wife he had a history of a positive Hemoccult, occasional bright red blood per rectum on the toilet paper. No pain. No constipation. I did a Hemoccult today which was negative. No hemorrhoids noted on exam either. Last colonoscopy was about a year and a half ago with Dr. Barron Alvine, a couple of small polyps were removed. If we do need gastroenterology again they would prefer digestive health specialists here in [place].

## 2023-07-25 NOTE — Assessment & Plan Note (Signed)
 Levi [name] is also now involved in wound care with [facility] wound care center, things are going well, all along he needed compression, he finally has compression wraps and his edema is significantly improved. He will continue care with the wound care team. He, like all older adults have difficulty donning his compression socks, I have suggested an over-the-counter compression sock donner to aid this.

## 2023-07-25 NOTE — Assessment & Plan Note (Signed)
 Please see above, currently working with the wound care center and doing well with compressive wraps and Unna boots.

## 2023-07-25 NOTE — Progress Notes (Signed)
    Procedures performed today:    None.  Independent interpretation of notes and tests performed by another provider:   None.  Brief History, Exam, Impression, and Recommendations:    Pain in toe of right fifth foot [name] is now plugged in with podiatry, we will watch from afar.  Venous insufficiency of both lower extremities [name] is also now involved in wound care with [facility], things are going well, all along he needed compression, he finally has compression wraps and his edema is significantly improved. He will continue care with the wound care team. He, like all older adults have difficulty donning his compression socks, I have suggested an over-the-counter compression sock donner to aid this.  Venous stasis ulcer (HCC) Please see above, currently working with the wound care center and doing well with compressive wraps and Unna boots.  Hematochezia Per wife he had a history of a positive Hemoccult, occasional bright red blood per rectum on the toilet paper. No pain. No constipation. I did a Hemoccult today which was negative. No hemorrhoids noted on exam either. Last colonoscopy was about a year and a half ago with Dr. Barron Alvine, a couple of small polyps were removed. If we do need gastroenterology again they would prefer digestive health specialists here in [place].  I spent 30 minutes of total time managing this patient today, this includes chart review, face to face, and non-face to face time.  ____________________________________________ [name]. [name], M.D., ABFM., CAQSM., AME. Primary Care and Sports Medicine [facility]  Adjunct Professor of Family Medicine  [place] [employer]  Restaurant manager, fast food

## 2023-07-27 ENCOUNTER — Other Ambulatory Visit: Payer: Self-pay | Admitting: Cardiology

## 2023-07-27 DIAGNOSIS — I480 Paroxysmal atrial fibrillation: Secondary | ICD-10-CM

## 2023-07-28 NOTE — Telephone Encounter (Signed)
 Prescription refill request for Eliquis received. Indication:afib Last office visit:5/24 Scr:0.85  12/24 Age: 78 Weight:88  kg  Prescription refilled

## 2023-07-29 ENCOUNTER — Ambulatory Visit: Payer: Medicare Other | Attending: Sports Medicine | Admitting: Physical Therapy

## 2023-07-29 ENCOUNTER — Encounter: Payer: Self-pay | Admitting: Physical Therapy

## 2023-07-29 DIAGNOSIS — M6281 Muscle weakness (generalized): Secondary | ICD-10-CM | POA: Insufficient documentation

## 2023-07-29 DIAGNOSIS — R262 Difficulty in walking, not elsewhere classified: Secondary | ICD-10-CM | POA: Diagnosis not present

## 2023-07-29 DIAGNOSIS — R2689 Other abnormalities of gait and mobility: Secondary | ICD-10-CM | POA: Insufficient documentation

## 2023-07-29 DIAGNOSIS — R2681 Unsteadiness on feet: Secondary | ICD-10-CM | POA: Insufficient documentation

## 2023-07-29 NOTE — Therapy (Signed)
 OUTPATIENT PHYSICAL THERAPY NEURO TREATMENT   Patient Name: Levi [name] MRN: [id number] DOB:[birth year], 78 y.o., male Today's Date: 07/29/2023   PCP: Venita Sheffield REFERRING PROVIDER: Venita Sheffield  END OF SESSION:  PT End of Session - 07/29/23 0927     Visit Number 2    Number of Visits 16    Date for PT Re-Evaluation 09/17/23    Authorization Type Medicare    Authorization - Visit Number 2    Progress Note Due on Visit 10    PT Start Time 0845    PT Stop Time 0925    PT Time Calculation (min) 40 min    Activity Tolerance Patient tolerated treatment well    Behavior During Therapy [facility] for tasks assessed/performed              Past Medical History:  Diagnosis Date   Agent orange exposure 1970's   takes Imdur,Diltiazem, and Atacand daily   Agent orange exposure    ALLERGIC RHINITIS    Allergy    seasonal allergies   Asthma    uses inhaler and nebulizers   Atrial fibrillation (HCC)    Bruises easily    d/t prednisone daily   Cataract    Cellulitis 03/22/2020   Chest wall pain 09/21/2019   L scapula, reported 4/27 21/     CHF (congestive heart failure) (HCC)    dx-on meds to treat   Chronic bronchitis (HCC)    "used to get it q yr; last time ?2008" (04/28/2012)   Clotting disorder (HCC) 2019   On blood thinner when I bleed it is for a consideral amount of time. This is tegardless of size of wound.   COPD (chronic obstructive pulmonary disease) (HCC)    agent orange exposure   Degenerative disk disease    "everywhere" (04/28/2012)   Diverticulitis    Dysrhythmia    afib   Elevated uric acid in blood    takes Allopurinol daily   Emphysema of lung (HCC)    Enlarged prostate    but not on any meds   GERD (gastroesophageal reflux disease)    takes Protonix daily   H/O hiatal hernia    Headache    History of colonoscopy    History of pneumonia    a. 2010   Hyperlipidemia    takes Lipitor daily; pt. states he takes a preventive    Hypothyroidism    Joint pain    Joint swelling    Leukocytosis    MSSA bacteremia 03/19/2023   Nausea and vomiting 11/12/2022   Neuromuscular disorder (HCC)    bilat legs, and bilat arms   Neuropathy    Osteoporosis 2012   PAF (paroxysmal atrial fibrillation) (HCC)    a. Dx 04/2012, CHA2DS2VASc = 1 (age);  b. 04/2012 Echo: EF 40-50%, mild MR.   Personal history of colonic adenomas 02/09/2013   Pneumonia    Sepsis (HCC) 03/18/2023   Shortness of breath dyspnea    Thyroid disease    no taking meds-caused dizziness   Past Surgical History:  Procedure Laterality Date   12 HOUR PH STUDY N/A 05/30/2021   Procedure: 24 HOUR PH STUDY;  Surgeon: Shellia Cleverly, DO;  Location: [facility];  Service: Gastroenterology;  Laterality: N/A;   ANTERIOR CERVICAL DECOMP/DISCECTOMY FUSION N/A 07/21/2012   Procedure: ANTERIOR CERVICAL DECOMPRESSION/DISCECTOMY FUSION 2 LEVELS;  Surgeon: Barnett Abu, MD;  Location: [facility];  Service: Neurosurgery;  Laterality: N/A;  C4-5 C5-6 Anterior cervical decompression/diskectomy/fusion  ANTERIOR LAT LUMBAR FUSION N/A 04/07/2017   Procedure: Thoracic twelve-Lumbar one Anterolateral decompression/fusion;  Surgeon: Barnett Abu, MD;  Location: [facility] OR;  Service: Neurosurgery;  Laterality: N/A;   ARTHRODESIS FOOT WITH WEIL OSTEOTOMY Left 11/07/2022   Procedure: LEFT THIRD METATARSOPHALANGEAL CAPSULOTOMY, WEIL OSTEOTOMY;  Surgeon: Terance Hart, MD;  Location: [facility] OR;  Service: Orthopedics;  Laterality: Left;  LENGTH OF SURGERY: 60 MINUTES   BACK SURGERY     x 3   CARDIAC CATHETERIZATION  11/2008   Dr. Anne Fu - 20% calcified non flow limiting left main, 50% EF apical hypokinesis   CERVICAL DISC ARTHROPLASTY N/A 04/07/2017   Procedure: Cervical six-seven Disc arthroplasty;  Surgeon: Barnett Abu, MD;  Location: [facility] OR;  Service: Neurosurgery;  Laterality: N/A;   CHEST TUBE INSERTION Left 04/11/2017   Procedure: CHEST TUBE INSERTION;  Surgeon: Kerin Perna, MD;  Location: [facility] OR;  Service: Thoracic;  Laterality: Left;   CHOLECYSTECTOMY     COLONOSCOPY  2016   CG-MAC-miralax(good)servere TICS/TA x 2   ESOPHAGEAL MANOMETRY N/A 05/30/2021   Procedure: ESOPHAGEAL MANOMETRY (EM);  Surgeon: Shellia Cleverly, DO;  Location: [facility];  Service: Gastroenterology;  Laterality: N/A;  ph impedence   ESOPHAGOGASTRODUODENOSCOPY     EYE SURGERY Bilateral 2011   steroidal encapsulation" (04/28/2012)   FOOT SURGERY Right    x 2   JOINT REPLACEMENT     Many surgeries including 5 joit replacements   KNEE ARTHROSCOPY Bilateral    LATERAL / POSTERIOR COMBINED FUSION LUMBAR SPINE  2012   LEFT ATRIAL APPENDAGE OCCLUSION N/A 02/22/2021   Procedure: LEFT ATRIAL APPENDAGE OCCLUSION;  Surgeon: Lanier Prude, MD;  [facility] CV LAB;  Service: Cardiovascular;  Laterality: N/A;   POLYPECTOMY  2016   severe TICS/TA x 2   POSTERIOR CERVICAL LAMINECTOMY WITH MET- RX Right 06/11/2021   Procedure: Right Cervical six-seven  Laminectomy and foraminotomy with metrex;  Surgeon: Barnett Abu, MD;  Location: [facility] OR;  Service: Neurosurgery;  Laterality: Right;   REVERSE SHOULDER ARTHROPLASTY  04/28/2012   Procedure: REVERSE SHOULDER ARTHROPLASTY;  Surgeon: Mable Paris, MD;  Location: [facility] OR;  Service: Orthopedics;  Laterality: Left;  Left shouder reverse total shoulder arthroplasty   SHOULDER SURGERY     SPINE SURGERY     I have had 7 spinal surgeries   TEE WITHOUT CARDIOVERSION N/A 02/22/2021   Procedure: TRANSESOPHAGEAL ECHOCARDIOGRAM (TEE);  Surgeon: Lanier Prude, MD;  Location: [facility] INVASIVE CV LAB;  Service: Cardiovascular;  Laterality: N/A;   TENDON REPAIR Right 08/07/2020   Procedure: RIGHT HAND LIGAMENT RECONSTRUCTION AND TENDON INTERPOSITION;  Surgeon: Jodi Geralds, MD;  Location: [facility];  Service: Orthopedics;  Laterality: Right;   TONSILLECTOMY AND ADENOIDECTOMY  1954   TOTAL HIP ARTHROPLASTY Left 2011    "left" (04/28/2012)   TOTAL HIP ARTHROPLASTY Right 07/31/2015   Procedure: TOTAL HIP ARTHROPLASTY ANTERIOR APPROACH;  Surgeon: Jodi Geralds, MD;  [facility];  Service: Orthopedics;  Laterality: Right;   TOTAL KNEE ARTHROPLASTY  01/10/2012   Procedure: TOTAL KNEE ARTHROPLASTY;  Surgeon: Harvie Junior, MD;  Location: [facility] OR;  Service: Orthopedics;;  left total knee arthroplasty   TOTAL KNEE ARTHROPLASTY Right 06/17/2014   Procedure: TOTAL KNEE ARTHROPLASTY;  Surgeon: Harvie Junior, MD;  Location: [facility] OR;  Service: Orthopedics;  Laterality: Right;   toupet fundolplication  11/01/2021   TRANSESOPHAGEAL ECHOCARDIOGRAM (CATH LAB) N/A 03/24/2023   Procedure: TRANSESOPHAGEAL ECHOCARDIOGRAM;  Surgeon: Chrystie Nose, MD;  [facility];  Service: Cardiovascular;  Laterality: N/A;   Patient Active Problem List   Diagnosis Date Noted   Hematochezia 07/25/2023   Pain in toe of right fifth foot 06/27/2023   Cellulitis 04/17/2023   Closed fracture of ramus of right pubis (HCC) 04/07/2023   S/P PICC central line placement 03/31/2023   Venous insufficiency of both lower extremities 03/20/2023   Chronic superficial venous thrombosis of both lower extremities 11/18/2022   Venous stasis ulcer (HCC) 09/13/2022   Primary osteoarthritis of right shoulder, status post left shoulder arthroplasty 07/05/2022   Shingles 05/16/2022   Vertigo 04/04/2022   BPH (benign prostatic hyperplasia) 02/21/2022   Annual physical exam 01/22/2022   Charcot's joint of foot, right 01/22/2022   Lower extremity edema 08/08/2021   Heartburn    Atypical chest pain    Cervical radicular pain 06/11/2021   Chronic anticoagulation 03/15/2021   Peripheral neuropathy 02/22/2021   Chronic systolic heart failure (HCC) 02/22/2021   Essential (primary) hypertension 11/15/2020   Arthritis of carpometacarpal (CMC) joint of right thumb 08/07/2020   COPD (chronic obstructive pulmonary disease) (HCC) 03/21/2020   GERD  (gastroesophageal reflux disease) status post Nissen fundoplication 12/03/2017   Lumbar stenosis status post T12-S1 fusion 04/07/2017   Cervical spondylosis with radiculopathy 01/01/2017   Cervical radiculopathy 04/11/2016   History of total right hip arthroplasty 07/31/2015   Chronic low back pain 10/13/2014   Primary osteoarthritis of right knee 06/17/2014   Thrush of mouth and esophagus (HCC) 04/03/2014   Hyperlipidemia    History of colonic polyps 02/09/2013   Paroxysmal atrial fibrillation (HCC) 05/11/2012   Osteoarthritis of left knee 01/10/2012   Seasonal and perennial allergic rhinitis 07/23/2010   Asthma, moderate persistent 03/06/2009    ONSET DATE: "years" MD visit January 2025  REFERRING DIAG: balance problems  THERAPY DIAG:  Muscle weakness (generalized)  Difficulty in walking, not elsewhere classified  Rationale for Evaluation and Treatment: Rehabilitation  SUBJECTIVE:                                                                                                                                                                                             SUBJECTIVE STATEMENT: Pt states that he is going to have to have his great toe and 2nd toe on his Lt foot amputated in a few months. He goes for his spinal cord stimulator next week. Pt accompanied by: self  PERTINENT HISTORY: bilat hip and knee replacements, Rt charcot foot. 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 He  uses a cane for ambulation and reports frequent falls. He reports he is sometimes able to get up after a fall but occasional requires assistance. He reports he has broken his hips, his ribs and his wrist due to falls. Pt reports he falls when his leg just "gives out".   PAIN:  Are you having pain?  Pt reports "aches and pains all over". He does report Rt foot pain 7/10 due to charcot foot  PRECAUTIONS: Fall Pt having  spinal cord stimulator placed 08/08/23  RED FLAGS: None   WEIGHT BEARING RESTRICTIONS: No  FALLS: Has patient fallen in last 6 months? Yes. Number of falls exact number unknown. Pt reports he falls often  LIVING ENVIRONMENT: Lives with: lives with their family Lives in: House/apartment Stairs: No Has following equipment at home: Single point cane  PLOF: Independent with community mobility with device  PATIENT GOALS: reduce falls  OBJECTIVE:  Note: Objective measures were completed at Evaluation unless otherwise noted.    SENSATION: Bilat LE neuropathy    LOWER EXTREMITY MMT:   All tested in sitting MMT Right Eval Left Eval  Hip flexion 3- 3-  Hip extension    Hip abduction 3 3  Hip adduction 3+ 3+  Hip internal rotation    Hip external rotation    Knee flexion 3+ 3-  Knee extension 4 4  Ankle dorsiflexion 3 3  Ankle plantarflexion    Ankle inversion    Ankle eversion    (Blank rows = not tested)   GAIT: Gait pattern: antalgic and wide BOS  Assistive device utilized: Single point cane and performed with SPC and without AD Level of assistance: Modified independence   FUNCTIONAL TESTS:  Timed up and go (TUG): 14.23 seconds without AD Berg Balance Scale: 19/56 BERG BALANCE TEST Sitting to Standing: 2.      Stands using hands after multiple trials Standing Unsupported: 3.      Stands 2 minutes with supervision Sitting Unsupported: 4.     Sits for 2 minutes independently Standing to Sitting: 1.     Sits independently, but uncontrolled descent  Transfers: 3.     Transfers safely definite use of hands Standing with eyes closed: 1.     Unable to keep eyes closed for 3 seconds, but is safe Standing with feet together: 0.     Needs help to attain position and unable to hold for 15 seconds Reaching forward with outstretched arm: 1.     Reaches forward with supervision Retrieving object from the floor: 3.     Able to pick up with supervision Turning to look  behind: 0.     Needs assistance Turning 360 degrees: 0.     Needs assistance Place alternate foot on stool: 1.     Completes >2 steps with minimal assist Standing with one foot in front: 0.     Loses balance while standing/stepping Standing on one foot: 0.     Unable  Total Score: 19/56  [facility] Adult PT Treatment:                                                DATE: 07/29/23 Therapeutic Exercise/Activity/NMR: Hip abd 2 x 10 HS curl 2 x 10 Mini squat 2 x 10 Toe raises x 10 LAQ with 3 sec hold x 10, 2# Narrow BOS --> Narrow BOS with head nods and head turns each 2 x 30 sec Gait with head turns/head nods min A Gait with speed changes min A Tap ups 6'' step with min/mod A Step ups 6'' step with 1 UE support x 8 bilat Tapping different color dots with alternating LEs - 1 UE support  TREATMENT DATE: 07/23/23 See HEP PT educated pt on PT POC and goals, rationale for treatment    PATIENT EDUCATION: Education details: PT POC and goals, HEP Person educated: Patient Education method: Explanation, Demonstration, and Handouts Education comprehension: verbalized understanding and returned demonstration  HOME EXERCISE PROGRAM: Access Code: [email or web address] URL: [email or web address] Date: 07/23/2023 Prepared by: Reggy Eye  Exercises - Standing Hip Abduction with Counter Support  - 1 x daily - 7 x weekly - 3 sets - 10 reps - Standing Knee Flexion with Counter Support  - 1 x daily - 7 x weekly - 3 sets - 10 reps - Mini Squat with Counter Support  - 1 x daily - 7 x weekly - 3 sets - 10 reps - Heel Toe Raises with Counter Support  - 1 x daily - 7 x weekly - 3 sets - 10 reps - Narrow Stance with Counter Support  - 1 x daily - 7 x weekly - 1 sets - 3 reps - 30 seconds hold  GOALS: Goals reviewed with patient? Yes  SHORT TERM GOALS: Target date:  08/20/2023    Pt will be independent with initial HEP Baseline: Goal status: INITIAL  2.  Pt will improve TUG to <= 13 seconds Baseline:  Goal status: INITIAL    LONG TERM GOALS: Target date: 09/17/2023    Pt will be independent in advanced HEP Baseline:  Goal status: INITIAL  2.  Pt will improve BERG balance test to >= 29/56 to demo decreased risk of falls Baseline:  Goal status: INITIAL  3.  Pt will perform floor to chair transfer with supervision Baseline:  Goal status: INITIAL  4.  Pt will improve LE strength to 4/5 bilat Baseline:  Goal status: INITIAL    ASSESSMENT:  CLINICAL IMPRESSION: Pt with improved balance since first visit. Able to add dynamic gait with min A. He is having spinal stimulator placed next week and will benefit from updating HEP for NWB exercise to perform during recovery.  OBJECTIVE IMPAIRMENTS: Abnormal gait, decreased activity tolerance, decreased balance, difficulty walking, and decreased strength.   PLAN:  PT FREQUENCY: 2x/week  PT DURATION: 8 weeks  PLANNED INTERVENTIONS: 97164- PT Re-evaluation, 97110-Therapeutic exercises, 97530- Therapeutic activity, [id number]- Neuromuscular re-education, 97535- Self Care, 16109- Manual therapy, [phone]- Gait training, [phone]- Aquatic Therapy, Patient/Family education, Balance training, and Taping  PLAN FOR NEXT SESSION: update HEP, balance, gait, floor transfers   Vondra Aldredge, PT 07/29/2023, 9:28 AM

## 2023-07-30 NOTE — Progress Notes (Signed)
 Surgical Instructions   Your procedure is scheduled on Friday, March 14th, 2025. Report to [facility] Main Entrance "A" at 5:30 A.M., then check in with the Admitting office. Any questions or running late day of surgery: call [phone]  Questions prior to your surgery date: call [phone], Monday-Friday, 8am-4pm. If you experience any cold or flu symptoms such as cough, fever, chills, shortness of breath, etc. between now and your scheduled surgery, please notify us at the above number.     Remember:  Do not eat or drink after midnight the night before your surgery    Take these medicines the morning of surgery with A SIP OF WATER: Allopurinol (Zyloprim) Atorvastatin (Lipitor) Duloxetine (Cymbalta) Isosorbide Mononitrate (Imdur) Metoprolol Succinate (Toprol-XL) Olodaterol HCL (Striverdi Respimat) Pantoprazole (Protonix) Prednisone (Deltasone) Pregabalin (Lyrica) Sulfamethoxazole-trimethoprim (Bactrim)   May take these medicines IF NEEDED: Albuterol Inhaler - please bring with you on the day of surgery Cyclobenzaprine (Flexeril) Ipratropium-albuterol (Duoneb) Meclizine (Antivert) Ondansetron (Zofran)   Per your cardiologist, your Eliquis should be held for 3 days prior to surgery.  Your last dose should be on Monday, March 10th.      One week prior to surgery, STOP taking any Aspirin (unless otherwise instructed by your surgeon) Aleve, Naproxen, Ibuprofen, Motrin, Advil, Goody's, BC's, all herbal medications, fish oil, and non-prescription vitamins.                     Do NOT Smoke (Tobacco/Vaping) for 24 hours prior to your procedure.  If you use a CPAP at night, you may bring your mask/headgear for your overnight stay.   You will be asked to remove any contacts, glasses, piercing's, hearing aid's, dentures/partials prior to surgery. Please bring cases for these items if needed.    Patients discharged the day of surgery will not be allowed to drive home, and  someone needs to stay with them for 24 hours.  SURGICAL WAITING ROOM VISITATION Patients may have no more than 2 support people in the waiting area - these visitors may rotate.   Pre-op nurse will coordinate an appropriate time for 1 ADULT support person, who may not rotate, to accompany patient in pre-op.  Children under the age of 75 must have an adult with them who is not the patient and must remain in the main waiting area with an adult.  If the patient needs to stay at the hospital during part of their recovery, the visitor guidelines for inpatient rooms apply.  Please refer to the [facility] website for the visitor guidelines for any additional information.   If you received a COVID test during your pre-op visit  it is requested that you wear a mask when out in public, stay away from anyone that may not be feeling well and notify your surgeon if you develop symptoms. If you have been in contact with anyone that has tested positive in the last 10 days please notify you surgeon.      Pre-operative CHG Bathing Instructions   You can play a key role in reducing the risk of infection after surgery. Your skin needs to be as free of germs as possible. You can reduce the number of germs on your skin by washing with CHG (chlorhexidine gluconate) soap before surgery. CHG is an antiseptic soap that kills germs and continues to kill germs even after washing.   DO NOT use if you have an allergy to chlorhexidine/CHG or antibacterial soaps. If your skin becomes reddened or irritated, stop using  the CHG and notify one of our RNs at [phone].              TAKE A SHOWER THE NIGHT BEFORE SURGERY AND THE DAY OF SURGERY    Please keep in mind the following:  DO NOT shave, including legs and underarms, 48 hours prior to surgery.   You may shave your face before/day of surgery.  Place clean sheets on your bed the night before surgery Use a clean washcloth (not used since being washed) for each  shower. DO NOT sleep with pet's night before surgery.  CHG Shower Instructions:  Wash your face and private area with normal soap. If you choose to wash your hair, wash first with your normal shampoo.  After you use shampoo/soap, rinse your hair and body thoroughly to remove shampoo/soap residue.  Turn the water OFF and apply half the bottle of CHG soap to a CLEAN washcloth.  Apply CHG soap ONLY FROM YOUR NECK DOWN TO YOUR TOES (washing for 3-5 minutes)  DO NOT use CHG soap on face, private areas, open wounds, or sores.  Pay special attention to the area where your surgery is being performed.  If you are having back surgery, having someone wash your back for you may be helpful. Wait 2 minutes after CHG soap is applied, then you may rinse off the CHG soap.  Pat dry with a clean towel  Put on clean pajamas    Additional instructions for the day of surgery: DO NOT APPLY any lotions, deodorants, cologne, or perfumes.   Do not wear jewelry or makeup Do not wear nail polish, gel polish, artificial nails, or any other type of covering on natural nails (fingers and toes) Do not bring valuables to the hospital. [facility] is not responsible for valuables/personal belongings. Put on clean/comfortable clothes.  Please brush your teeth.  Ask your nurse before applying any prescription medications to the skin.

## 2023-07-31 ENCOUNTER — Other Ambulatory Visit: Payer: Self-pay

## 2023-07-31 ENCOUNTER — Encounter (HOSPITAL_COMMUNITY)
Admission: RE | Admit: 2023-07-31 | Discharge: 2023-07-31 | Disposition: A | Discharge status: Home / Self Care | Source: Ambulatory Visit | Attending: Pain Medicine | Admitting: Pain Medicine

## 2023-07-31 ENCOUNTER — Encounter (HOSPITAL_COMMUNITY): Payer: Self-pay

## 2023-07-31 VITALS — BP 146/87 | HR 98 | Temp 98.0°F | Resp 19 | Ht 74.0 in | Wt 204.0 lb

## 2023-07-31 DIAGNOSIS — E039 Hypothyroidism, unspecified: Secondary | ICD-10-CM | POA: Insufficient documentation

## 2023-07-31 DIAGNOSIS — Z7952 Long term (current) use of systemic steroids: Secondary | ICD-10-CM | POA: Insufficient documentation

## 2023-07-31 DIAGNOSIS — I480 Paroxysmal atrial fibrillation: Secondary | ICD-10-CM | POA: Diagnosis not present

## 2023-07-31 DIAGNOSIS — Z01812 Encounter for preprocedural laboratory examination: Secondary | ICD-10-CM | POA: Insufficient documentation

## 2023-07-31 DIAGNOSIS — K219 Gastro-esophageal reflux disease without esophagitis: Secondary | ICD-10-CM | POA: Diagnosis not present

## 2023-07-31 DIAGNOSIS — E785 Hyperlipidemia, unspecified: Secondary | ICD-10-CM | POA: Diagnosis not present

## 2023-07-31 DIAGNOSIS — J449 Chronic obstructive pulmonary disease, unspecified: Secondary | ICD-10-CM | POA: Insufficient documentation

## 2023-07-31 DIAGNOSIS — I110 Hypertensive heart disease with heart failure: Secondary | ICD-10-CM | POA: Diagnosis not present

## 2023-07-31 DIAGNOSIS — I509 Heart failure, unspecified: Secondary | ICD-10-CM | POA: Diagnosis not present

## 2023-07-31 DIAGNOSIS — I10 Essential (primary) hypertension: Secondary | ICD-10-CM

## 2023-07-31 DIAGNOSIS — Z01818 Encounter for other preprocedural examination: Secondary | ICD-10-CM

## 2023-07-31 HISTORY — DX: Anemia, unspecified: D64.9

## 2023-07-31 LAB — CBC
HCT: 37.1 % — ABNORMAL LOW (ref 39.0–52.0)
Hemoglobin: 11.7 g/dL — ABNORMAL LOW (ref 13.0–17.0)
MCH: 29.6 pg (ref 26.0–34.0)
MCHC: 31.5 g/dL (ref 30.0–36.0)
MCV: 93.9 fL (ref 80.0–100.0)
Platelets: 159 10*3/uL (ref 150–400)
RBC: 3.95 MIL/uL — ABNORMAL LOW (ref 4.22–5.81)
RDW: 20.2 % — ABNORMAL HIGH (ref 11.5–15.5)
WBC: 9 10*3/uL (ref 4.0–10.5)
nRBC: 0 % (ref 0.0–0.2)

## 2023-07-31 LAB — BASIC METABOLIC PANEL
Anion gap: 8 (ref 5–15)
BUN: 26 mg/dL — ABNORMAL HIGH (ref 8–23)
CO2: 22 mmol/L (ref 22–32)
Calcium: 8.9 mg/dL (ref 8.9–10.3)
Chloride: 106 mmol/L (ref 98–111)
Creatinine, Ser: 0.93 mg/dL (ref 0.61–1.24)
GFR, Estimated: >60 mL/min (ref >60)
Glucose, Bld: 124 mg/dL — ABNORMAL HIGH (ref 70–99)
Potassium: 4.1 mmol/L (ref 3.5–5.1)
Sodium: 136 mmol/L (ref 135–145)

## 2023-07-31 LAB — SURGICAL PCR SCREEN
MRSA, PCR: NEGATIVE
Staphylococcus aureus: NEGATIVE

## 2023-07-31 NOTE — Progress Notes (Signed)
 PCP - Dr. Rodney Langton Cardiologist - Dr. Donato Schultz  PPM/ICD - denies   Chest x-ray - 06/27/23 EKG - 03/19/23 Stress Test - 2010 per pt, with Dr. Anne Fu ECHO - 03/24/23 Cardiac Cath - 11/2008  Sleep Study - denies   DM- denies  Last dose of GLP1 agonist-  n/a   Blood Thinner Instructions: Hold Eliquis 3 days. Last dose 3/10 Aspirin Instructions: n/a  ERAS Protcol - no, NPO   COVID TEST- n/a   Anesthesia review: yes, cardiac hx. Pt is on Bactrim. He states this was given when he was diagnosed with Sepsis in October. Pt also states he has right and left foot pain and that he is going to have to have "toes removed" from left foot. He also has an ace wrap around his left calf. He states this is from a cut that he got and had to have 26 stitches and that it has not healed all the way, but most of the way. He states that Dr. Lorrine Kin is aware of this.   Patient denies shortness of breath, fever, cough and chest pain at PAT appointment   All instructions explained to the patient, with a verbal understanding of the material. Patient agrees to go over the instructions while at home for a better understanding. The opportunity to ask questions was provided.

## 2023-08-01 ENCOUNTER — Ambulatory Visit: Payer: Medicare Other | Admitting: Physical Therapy

## 2023-08-01 DIAGNOSIS — I83029 Varicose veins of left lower extremity with ulcer of unspecified site: Secondary | ICD-10-CM | POA: Diagnosis not present

## 2023-08-01 DIAGNOSIS — T8133XD Disruption of traumatic injury wound repair, subsequent encounter: Secondary | ICD-10-CM | POA: Diagnosis not present

## 2023-08-01 DIAGNOSIS — S81802A Unspecified open wound, left lower leg, initial encounter: Secondary | ICD-10-CM | POA: Diagnosis not present

## 2023-08-01 DIAGNOSIS — I872 Venous insufficiency (chronic) (peripheral): Secondary | ICD-10-CM | POA: Diagnosis not present

## 2023-08-01 DIAGNOSIS — L97929 Non-pressure chronic ulcer of unspecified part of left lower leg with unspecified severity: Secondary | ICD-10-CM | POA: Diagnosis not present

## 2023-08-01 DIAGNOSIS — R600 Localized edema: Secondary | ICD-10-CM | POA: Diagnosis not present

## 2023-08-01 NOTE — Progress Notes (Addendum)
 Anesthesia Chart Review:  Case: [id number] Date/Time: 08/08/23 0715   Procedure: PERM SCS - 3C   Anesthesia type: General   Pre-op diagnosis: STATUS POST LUMBAR SPINE SURGERY FOR DECOMPRESSION OF SPINAL CORD   [facility] / [facility]   Surgeons: Renaldo Fiddler, MD       DISCUSSION: Patient is a 78 year old male scheduled for the above procedure.   History includes never smoker, HLD, PAF (diagnosed 04/2012; unable to place Watchman device due to highly redundant/flexible interatrial septum 02/22/21), CHF, COPD/emphysema, asthma (steroid dependent), agent orange exposure, dyspnea, GERD/hiatal hernia (s/p Robotic Toupet fundoplication 6/823), anemia, BPH, diverticulitis, hypothyroidism, arthritis/DJD (left THA 02/21/10; left TKA 01/10/12; left reverse TSA 04/28/12; right TKA 06/17/14; right THA 07/31/15; right hand arthroplasty 08/07/20), spinal surgeries (L1-5 pedicle screw fixation, L4-5 posterolateral arthrodesis 07/30/10; C4-6 ACDF 07/21/12; revision L4-5 arthrodesis, PLIF L5-S1 06/15/13; T12-L1 total discectomy/anterolateral arthrodesis & C6-7 ACDF 04/07/17, complicated by left pleural opening with repair in OR but required left CT 04/11/17 for left pneumothorax; right C6-7 laminectomy 06/11/21), venous insufficiency (with venous stasis ulcers; s/p sclerotherapy at [facility] 09/2022), Charcot foot (right), MSSA bacteremia (02/2023, RLE venous wound likely source, TEE without vegetation; completed Cipro, linezolid for Pseudomonas and Staph hemolyticus & Bactrim for PJP prophylaxis per ID given chronic steroids).    On 05/16/23 he was seen in the ED for large 17.8 cm LLE laceration after he scraped his lower leg on the edge of a bench. He required 26 stitches. He was referred to wound care and has been followed at the [facility]. He was also placed back on Bactrim. Last visit with wound care was on 07/25/23. Removal of slough and wound debridement with topical anesthetic  performed. Continue 3 layer compression wrap and collagen for the left posterior leg with 1 week follow-up. He also has a left 2nd toe wound that is followed by podiatrist Dr. Ralene Cork, last visit 07/24/23 with debridement performed of ulceration with fat layer exposed. He is off-loading with a surgical shoe. Left foot xray showed "No acute fractures or dislocations. No osseous erosions noted." He told PAT staff he will likely need left toe amputation. He indicated Dr. Lorrine Kin is aware of his foot and leg wounds. Will plan to verify with office.    He is followed by cardiologist Dr. Anne Fu for afib/PAF. Unsuccessful attempt to place Watchman device in 2022 due to redundant atrial septum. Coronary calcium score 197 (49th percentile) in 2022. Up to 20% LAD 2020 LHC. LVEF ~ 40-50% since at least 2022. Last echo was a TEE during admission for MSSA bacteremia in 03/24/23. Finding showed LVEF 40-45%, global LV hypokinesis, mildly dilated LV cavity, normal RV systolic function, No LA/LAA thrombus, mild MR, trivial-mild AR, negative bubble study, no vegetations noted.  Preoperative cardiology input outlined by Joni Reining, DNP on 07/18/23: "Given past medical history and time since last visit, based on ACC/AHA guidelines, Sindy Guadeloupe is at acceptable risk for the planned procedure without further cardiovascular testing...  Okay to hold Eliquis for 3 days prior to the procedure. See notes from 07/07/2023 as he was cleared 10 days ago for temporary implantation of stimulator." Last dose planned for 08/04/23.   He is on prednisone 10 mg BID for moderate persistent asthma that is followed by pulmonologist Dr. Maple Hudson. Last visit 06/17/23. Continue Striverdi and as needed albuterol MDI/nebulizer. He suggested trying to cut evening prednisone down to 5 mg, but he continues on 10 mg BID.  Follow-up in 4 months planned.  Anesthesia team to evaluate on the day of surgery. As above he is on chronic prednisone 10 mg BID.  (UPDATE 08/04/23 12:33 PM: Leotis Shames from Dr. Kathlene Cote office called. He is actually scheduled to see Mr. Beaver today and can follow-up regarding ongoing wound issues and if they are felt stable from a surgery standpoint to proceed with SCS.)    VS: BP (!) 146/87   Pulse 98   Temp 36.7 C   Resp 19   Ht 6\' 2"  (1.88 m)   Wt 92.5 kg   SpO2 100%   BMI 26.19 kg/m    PROVIDERS: Venita Sheffield, MD is PCP  - Donato Schultz, MD is cardiologist - Steffanie Dunn, MD is EP cardiologist. Patient evaluated on 01/23/21 for consideration of Watchman device due to increased risks related to anticoagulation therapy with his history of recurrent falls. Watchman device was attempted on 02/22/21, but "interatrial septum was highly redundant and transseptal puncture was too close to the outside wall of the left atrium to be safely performed.  Therefore watchman attempt was aborted." - Young, Joni Fears, MD is pulmonologist - Myra Gianotti, V. Anner Crete, MD is vascular surgeon - Huston Foley, MD is neurologist - Doristine Locks, DO is GI - Saw hematologist Arlan Organ, MD on 02/28/20 for hypogammaglobulinemia with non-healing right leg ulcer. "There is no hematologic malignancy.  He has mildly suppressed IgG level of 357 mg/dL.  His IgA level was 53 mg/dL.  He had no monoclonal spike in his blood.  There is no monoclonal light chains." He did not think the RLE ulcer was related to low imunoglobulins, and more likely from steroids. He was sent to the ED for further evaluation of RLE ulcer and swelling.  - Jovita Gamma, MD is Wound Care provider ([facility]) - Louann Sjogren, DPM is podiatrist   LABS: Labs reviewed: Acceptable for surgery. Last A1c noted was 5.8% on 07/05/22.  (all labs ordered are listed, but only abnormal results are displayed)  Labs Reviewed  BASIC METABOLIC PANEL - Abnormal; Notable for the following components:      Result Value   Glucose, Bld 124 (*)    BUN 26 (*)    All other components  within normal limits  CBC - Abnormal; Notable for the following components:   RBC 3.95 (*)    Hemoglobin 11.7 (*)    HCT 37.1 (*)    RDW 20.2 (*)    All other components within normal limits  SURGICAL PCR SCREEN    Office Spirometry 10/25/16: "mild restriction of exhaled volume, minimal obstruction. FVC 4.04/78%, FEV1 2.71/71%, ratio 0.67, FEF 25-75% 1.68/58%"     IMAGES:  CXR 06/27/23: FINDINGS: Cardiac silhouette and mediastinal contours are unchanged and within limits. There is again mild chronic elevation of left hemidiaphragm. Unchanged scarring at the left costophrenic angle. The lungs are clear. No pleural effusion pneumothorax. Moderate multilevel degenerative disc changes of the thoracic spine with partially visualized upper lumbar spine posterior fusion hardware. Partial visualization of reverse total left shoulder arthroplasty. Partial visualization of cervical spine fusion hardware. IMPRESSION: 1. No active cardiopulmonary disease. 2. Unchanged mild chronic elevation of left hemidiaphragm and scarring at the left costophrenic angle.    EKG: 03/19/23: Sinus tachycardia at 105 bpm with Premature atrial complexes Cannot rule out Anterior infarct , age undetermined Abnormal ECG No significant change since last tracing Confirmed by Melene Plan [phone]) on 03/20/2023 11:38:48 AM     CV: LLE Venous US 05/29/23: IMPRESSION: 1. No  evidence of deep venous thrombosis in the left lower extremity. 2. Occlusion of the graded saphenous vein that is felt most likely secondary to a previous superficial thrombotic event with treatment. Impossible to exclude an element of acute superficial thrombosis.    TEE 03/24/23: IMPRESSIONS   1. Left ventricular ejection fraction, by estimation, is 40 to 45%. The  left ventricle has mildly decreased function. The left ventricle  demonstrates global hypokinesis. The left ventricular internal cavity size  was mildly dilated.   2. Right  ventricular systolic function is normal. The right ventricular  size is normal.   3. No left atrial/left atrial appendage thrombus was detected.   4. The mitral valve is grossly normal. Mild mitral valve regurgitation.   5. The aortic valve is tricuspid. Aortic valve regurgitation trivial to  mild. No aortic stenosis is present.   6. Agitated saline contrast bubble study was negative, with no evidence  of any interatrial shunt.  - Conclusion(s)/Recommendation(s): No evidence of vegetation/infective  endocarditis on this transesophageael echocardiogram.  - LVEF 40-50% 05/10/12; 40-45% with diffuse LV hypokinesis 11/03/17; 45-50% with diffuse LV hypokinesis 02/14/21   TTE 03/20/23: IMPRESSIONS   1. Left ventricular ejection fraction, by estimation, is 40 to 45%. The  left ventricle has mildly decreased function. The left ventricle  demonstrates global hypokinesis. Left ventricular diastolic parameters are  consistent with Grade I diastolic  dysfunction (impaired relaxation).   2. Right ventricular systolic function is normal. The right ventricular  size is normal.   3. The mitral valve is normal in structure. Mild mitral valve  regurgitation. No evidence of mitral stenosis.   4. The aortic valve is tricuspid. There is mild calcification of the  aortic valve. Aortic valve regurgitation is mild. Mild aortic valve  stenosis. Aortic regurgitation PHT measures 332 msec. Aortic valve area,  by VTI measures 1.42 cm. Aortic valve mean   gradient measures 12.0 mmHg. Aortic valve Vmax measures 2.28 m/s.    BLE ABIs 06/20/22: Summary:  - Right: Resting right ankle-brachial index indicates noncompressible right  lower extremity arteries. The right toe-brachial index is normal.  - Left: Resting left ankle-brachial index indicates noncompressible left  lower extremity arteries. The left toe-brachial index is normal.      EP Procedure 02/22/21: PROCEDURES:  1. Transseptal puncture 2.  Transesophageal echocardiogram 3. Left atrial appendage occlusive device placement.  Per report, "Despite many attempts, I was unable to find a suitable transseptal location due to the apposition of the posterior wall to the interatrial septum when the sheath engaged the septal tissue. Because of this close proximity/apposition, I did not think transseptal access was safe." CONCLUSIONS: 1. Aneurysmal interatrial septum 2. TEE demonstrating no LAA thrombus 3. No early apparent complications.        CT Cardiac (for Watchman planning) 01/31/21: IMPRESSION: 1. The left atrial appendage is a large chicken wing morphology without thrombus. 2. A 31mm Watchman FLX device is recommended based on the above landing zone measurements (25.4 mm maximum diameter; 20% compression). 3. There is no thrombus in the left atrial appendage. 4. An inferior posterior IAS puncture site is recommended. 5. Optimal deployment angle: RAO 2 CAU 15 6. Normal coronary origin. Right dominance. 7. Coronary artery calcium score is 197, which is 49th percentile for age and sex matched peers.     Cardiac cath 11/25/08 (done due to abnormal stress test on 11/22/08 that showed mild inferior ischemia, transient ischemic dilatation possibly indications 3V CAD,  EF 43%): IMPRESSION: 1. Calcified ostial  left anterior descending lesion of up to 20% stenosis, which is not flow limiting, otherwise no angiographically significant coronary artery disease.  2. Left ventricular ejection fraction of approximately 50% with mildly dilated left ventricle and apical hypokinesis noted.  3. No abdominal aortic aneurysm. No renal artery stenosis.   Past Medical History:  Diagnosis Date   Agent orange exposure 1970's   takes Imdur,Diltiazem, and Atacand daily   Agent orange exposure    ALLERGIC RHINITIS    Allergy    seasonal allergies   Anemia    iron deficiency   Asthma    uses inhaler and nebulizers   Atrial fibrillation (HCC)     Bruises easily    d/t prednisone daily   Cataract    Cellulitis 03/22/2020   Chest wall pain 09/21/2019   L scapula, reported 4/27 21/     CHF (congestive heart failure) (HCC)    dx-on meds to treat   Chronic bronchitis (HCC)    "used to get it q yr; last time ?2008" (04/28/2012)   Clotting disorder (HCC) 2019   On blood thinner when I bleed it is for a consideral amount of time. This is tegardless of size of wound.   COPD (chronic obstructive pulmonary disease) (HCC)    agent orange exposure   Degenerative disk disease    "everywhere" (04/28/2012)   Diverticulitis    Dysrhythmia    afib   Elevated uric acid in blood    takes Allopurinol daily   Emphysema of lung (HCC)    Enlarged prostate    but not on any meds   GERD (gastroesophageal reflux disease)    takes Protonix daily   H/O hiatal hernia    Headache    History of colonoscopy    History of pneumonia    a. 2010   Hyperlipidemia    takes Lipitor daily; pt. states he takes a preventive   Hypothyroidism    Joint pain    Joint swelling    Leukocytosis    MSSA bacteremia 03/19/2023   Nausea and vomiting 11/12/2022   Neuromuscular disorder (HCC)    bilat legs, and bilat arms   Neuropathy    Osteoporosis 2012   PAF (paroxysmal atrial fibrillation) (HCC)    a. Dx 04/2012, CHA2DS2VASc = 1 (age);  b. 04/2012 Echo: EF 40-50%, mild MR.   Personal history of colonic adenomas 02/09/2013   Pneumonia    Sepsis (HCC) 03/18/2023   Shortness of breath dyspnea    Thyroid disease    no taking meds-caused dizziness    Past Surgical History:  Procedure Laterality Date   24 HOUR PH STUDY N/A 05/30/2021   Procedure: 24 HOUR PH STUDY;  Surgeon: Shellia Cleverly, DO;  Location: [facility];  Service: Gastroenterology;  Laterality: N/A;   ANTERIOR CERVICAL DECOMP/DISCECTOMY FUSION N/A 07/21/2012   Procedure: ANTERIOR CERVICAL DECOMPRESSION/DISCECTOMY FUSION 2 LEVELS;  Surgeon: Barnett Abu, MD;  Location: [facility];  Service:  Neurosurgery;  Laterality: N/A;  C4-5 C5-6 Anterior cervical decompression/diskectomy/fusion   ANTERIOR LAT LUMBAR FUSION N/A 04/07/2017   Procedure: Thoracic twelve-Lumbar one Anterolateral decompression/fusion;  Surgeon: Barnett Abu, MD;  Location: [facility];  Service: Neurosurgery;  Laterality: N/A;   ARTHRODESIS FOOT WITH WEIL OSTEOTOMY Left 11/07/2022   Procedure: LEFT THIRD METATARSOPHALANGEAL CAPSULOTOMY, WEIL OSTEOTOMY;  Surgeon: Terance Hart, MD;  Location: [facility] OR;  Service: Orthopedics;  Laterality: Left;  LENGTH OF SURGERY: 60 MINUTES   BACK SURGERY     x 3  CARDIAC CATHETERIZATION  11/2008   Dr. Anne Fu - 20% calcified non flow limiting left main, 50% EF apical hypokinesis   CARPAL TUNNEL RELEASE Bilateral    CERVICAL DISC ARTHROPLASTY N/A 04/07/2017   Procedure: Cervical six-seven Disc arthroplasty;  Surgeon: Barnett Abu, MD;  Location: [facility] OR;  Service: Neurosurgery;  Laterality: N/A;   CHEST TUBE INSERTION Left 04/11/2017   Procedure: CHEST TUBE INSERTION;  Surgeon: Kerin Perna, MD;  Location: [facility] OR;  Service: Thoracic;  Laterality: Left;   CHOLECYSTECTOMY     COLONOSCOPY  2016   CG-MAC-miralax(good)servere TICS/TA x 2   ESOPHAGEAL MANOMETRY N/A 05/30/2021   Procedure: ESOPHAGEAL MANOMETRY (EM);  Surgeon: Shellia Cleverly, DO;  Location: [facility];  Service: Gastroenterology;  Laterality: N/A;  ph impedence   ESOPHAGOGASTRODUODENOSCOPY     EYE SURGERY Bilateral 2011   steroidal encapsulation" (04/28/2012)   FOOT SURGERY Right    x 2   JOINT REPLACEMENT     Many surgeries including 5 joit replacements   KNEE ARTHROSCOPY Bilateral    LATERAL / POSTERIOR COMBINED FUSION LUMBAR SPINE  2012   LEFT ATRIAL APPENDAGE OCCLUSION N/A 02/22/2021   Procedure: LEFT ATRIAL APPENDAGE OCCLUSION;  Surgeon: Lanier Prude, MD;  [facility];  Service: Cardiovascular;  Laterality: N/A;   NEUROPLASTY / TRANSPOSITION ULNAR NERVE AT ELBOW Bilateral     POLYPECTOMY  2016   severe TICS/TA x 2   POSTERIOR CERVICAL LAMINECTOMY WITH MET- RX Right 06/11/2021   Procedure: Right Cervical six-seven  Laminectomy and foraminotomy with metrex;  Surgeon: Barnett Abu, MD;  [facility];  Service: Neurosurgery;  Laterality: Right;   REFRACTIVE SURGERY Left 07/07/2023   laser surgery but still has "floaters"   REVERSE SHOULDER ARTHROPLASTY  04/28/2012   Procedure: REVERSE SHOULDER ARTHROPLASTY;  Surgeon: Mable Paris, MD;  Location: [facility] OR;  Service: Orthopedics;  Laterality: Left;  Left shouder reverse total shoulder arthroplasty   SHOULDER SURGERY     SPINE SURGERY     I have had 7 spinal surgeries   TEE WITHOUT CARDIOVERSION N/A 02/22/2021   Procedure: TRANSESOPHAGEAL ECHOCARDIOGRAM (TEE);  Surgeon: Lanier Prude, MD;  Location: [facility] INVASIVE CV LAB;  Service: Cardiovascular;  Laterality: N/A;   TENDON REPAIR Right 08/07/2020   Procedure: RIGHT HAND LIGAMENT RECONSTRUCTION AND TENDON INTERPOSITION;  Surgeon: Jodi Geralds, MD;  Location: [facility];  Service: Orthopedics;  Laterality: Right;   TONSILLECTOMY AND ADENOIDECTOMY  1954   TOTAL HIP ARTHROPLASTY Left 2011   "left" (04/28/2012)   TOTAL HIP ARTHROPLASTY Right 07/31/2015   Procedure: TOTAL HIP ARTHROPLASTY ANTERIOR APPROACH;  Surgeon: Jodi Geralds, MD;  [facility];  Service: Orthopedics;  Laterality: Right;   TOTAL KNEE ARTHROPLASTY  01/10/2012   Procedure: TOTAL KNEE ARTHROPLASTY;  Surgeon: Harvie Junior, MD;  [facility];  Service: Orthopedics;;  left total knee arthroplasty   TOTAL KNEE ARTHROPLASTY Right 06/17/2014   Procedure: TOTAL KNEE ARTHROPLASTY;  Surgeon: Harvie Junior, MD;  [facility];  Service: Orthopedics;  Laterality: Right;   toupet fundolplication  11/01/2021   TRANSESOPHAGEAL ECHOCARDIOGRAM (CATH LAB) N/A 03/24/2023   Procedure: TRANSESOPHAGEAL ECHOCARDIOGRAM;  Surgeon: Chrystie Nose, MD;  [facility];  Service:  Cardiovascular;  Laterality: N/A;    MEDICATIONS:  albuterol (VENTOLIN HFA) 108 (90 Base) MCG/ACT inhaler   allopurinol (ZYLOPRIM) 100 MG tablet   atorvastatin (LIPITOR) 20 MG tablet   candesartan (ATACAND) 4 MG tablet   cyclobenzaprine (FLEXERIL)  10 MG tablet   DULoxetine (CYMBALTA) 30 MG capsule   ELIQUIS 5 MG TABS tablet   ferrous gluconate (FERGON) 324 MG tablet   furosemide (LASIX) 40 MG tablet   ipratropium-albuterol (DUONEB) 0.5-2.5 (3) MG/3ML SOLN   isosorbide mononitrate (IMDUR) 30 MG 24 hr tablet   meclizine (ANTIVERT) 25 MG tablet   metoprolol succinate (TOPROL-XL) 50 MG 24 hr tablet   Nebulizers (COMPRESSOR/NEBULIZER) MISC   Olodaterol HCl (STRIVERDI RESPIMAT) 2.5 MCG/ACT AERS   ondansetron (ZOFRAN-ODT) 8 MG disintegrating tablet   pantoprazole (PROTONIX) 40 MG tablet   predniSONE (DELTASONE) 10 MG tablet   pregabalin (LYRICA) 100 MG capsule   sulfamethoxazole-trimethoprim (BACTRIM) 400-80 MG tablet   tamsulosin (FLOMAX) 0.4 MG CAPS capsule   No current facility-administered medications for this encounter.    Shonna Chock, PA-C Surgical Short Stay/Anesthesiology [facility] Phone [phone] [facility] Phone [phone] 08/01/2023 5:29 PM

## 2023-08-01 NOTE — Anesthesia Preprocedure Evaluation (Signed)
 Anesthesia Evaluation    Airway        Dental   Pulmonary           Cardiovascular      Neuro/Psych    GI/Hepatic   Endo/Other    Renal/GU      Musculoskeletal   Abdominal   Peds  Hematology   Anesthesia Other Findings   Reproductive/Obstetrics                             Anesthesia Physical Anesthesia Plan  ASA:   Anesthesia Plan:    Post-op Pain Management:    Induction:   PONV Risk Score and Plan:   Airway Management Planned:   Additional Equipment:   Intra-op Plan:   Post-operative Plan:   Informed Consent:   Plan Discussed with:   Anesthesia Plan Comments: (PAT note written 08/01/2023 by Shonna Chock, PA-C.  )       Anesthesia Quick Evaluation

## 2023-08-04 ENCOUNTER — Ambulatory Visit: Payer: Medicare Other

## 2023-08-04 DIAGNOSIS — Z9889 Other specified postprocedural states: Secondary | ICD-10-CM | POA: Diagnosis not present

## 2023-08-04 DIAGNOSIS — M79605 Pain in left leg: Secondary | ICD-10-CM | POA: Diagnosis not present

## 2023-08-04 DIAGNOSIS — R262 Difficulty in walking, not elsewhere classified: Secondary | ICD-10-CM | POA: Diagnosis not present

## 2023-08-04 DIAGNOSIS — R2689 Other abnormalities of gait and mobility: Secondary | ICD-10-CM | POA: Diagnosis not present

## 2023-08-04 DIAGNOSIS — M6281 Muscle weakness (generalized): Secondary | ICD-10-CM | POA: Diagnosis not present

## 2023-08-04 DIAGNOSIS — G894 Chronic pain syndrome: Secondary | ICD-10-CM | POA: Diagnosis not present

## 2023-08-04 DIAGNOSIS — R2681 Unsteadiness on feet: Secondary | ICD-10-CM

## 2023-08-04 NOTE — Therapy (Signed)
 OUTPATIENT PHYSICAL THERAPY NEURO TREATMENT   Patient Name: Levi [name] MRN: [id number] DOB:[birth year], 78 y.o., male Today's Date: 08/04/2023   PCP: Venita Sheffield REFERRING PROVIDER: Venita Sheffield  END OF SESSION:  PT End of Session - 08/04/23 0754     Visit Number 3    Number of Visits 16    Date for PT Re-Evaluation 09/17/23    Authorization Type Medicare    Progress Note Due on Visit 10    PT Start Time 0800    PT Stop Time 0842    PT Time Calculation (min) 42 min    Activity Tolerance Patient tolerated treatment well    Behavior During Therapy WFL for tasks assessed/performed            Past Medical History:  Diagnosis Date   Agent orange exposure 1970's   takes Imdur,Diltiazem, and Atacand daily   Agent orange exposure    ALLERGIC RHINITIS    Allergy    seasonal allergies   Anemia    iron deficiency   Asthma    uses inhaler and nebulizers   Atrial fibrillation (HCC)    Bruises easily    d/t prednisone daily   Cataract    Cellulitis 03/22/2020   Chest wall pain 09/21/2019   L scapula, reported 4/27 21/     CHF (congestive heart failure) (HCC)    dx-on meds to treat   Chronic bronchitis (HCC)    "used to get it q yr; last time ?2008" (04/28/2012)   Clotting disorder (HCC) 2019   On blood thinner when I bleed it is for a consideral amount of time. This is tegardless of size of wound.   COPD (chronic obstructive pulmonary disease) (HCC)    agent orange exposure   Degenerative disk disease    "everywhere" (04/28/2012)   Diverticulitis    Dysrhythmia    afib   Elevated uric acid in blood    takes Allopurinol daily   Emphysema of lung (HCC)    Enlarged prostate    but not on any meds   GERD (gastroesophageal reflux disease)    takes Protonix daily   H/O hiatal hernia    Headache    History of colonoscopy    History of pneumonia    a. 2010   Hyperlipidemia    takes Lipitor daily; pt. states he takes a preventive    Hypothyroidism    Joint pain    Joint swelling    Leukocytosis    MSSA bacteremia 03/19/2023   Nausea and vomiting 11/12/2022   Neuromuscular disorder (HCC)    bilat legs, and bilat arms   Neuropathy    Osteoporosis 2012   PAF (paroxysmal atrial fibrillation) (HCC)    a. Dx 04/2012, CHA2DS2VASc = 1 (age);  b. 04/2012 Echo: EF 40-50%, mild MR.   Personal history of colonic adenomas 02/09/2013   Pneumonia    Sepsis (HCC) 03/18/2023   Shortness of breath dyspnea    Thyroid disease    no taking meds-caused dizziness   Past Surgical History:  Procedure Laterality Date   51 HOUR PH STUDY N/A 05/30/2021   Procedure: 24 HOUR PH STUDY;  Surgeon: Shellia Cleverly, DO;  Location: [facility];  Service: Gastroenterology;  Laterality: N/A;   ANTERIOR CERVICAL DECOMP/DISCECTOMY FUSION N/A 07/21/2012   Procedure: ANTERIOR CERVICAL DECOMPRESSION/DISCECTOMY FUSION 2 LEVELS;  Surgeon: Barnett Abu, MD;  Location: [facility];  Service: Neurosurgery;  Laterality: N/A;  C4-5 C5-6 Anterior cervical decompression/diskectomy/fusion  ANTERIOR LAT LUMBAR FUSION N/A 04/07/2017   Procedure: Thoracic twelve-Lumbar one Anterolateral decompression/fusion;  Surgeon: Barnett Abu, MD;  Location: [facility] OR;  Service: Neurosurgery;  Laterality: N/A;   ARTHRODESIS FOOT WITH WEIL OSTEOTOMY Left 11/07/2022   Procedure: LEFT THIRD METATARSOPHALANGEAL CAPSULOTOMY, WEIL OSTEOTOMY;  Surgeon: Terance Hart, MD;  Location: [facility] OR;  Service: Orthopedics;  Laterality: Left;  LENGTH OF SURGERY: 60 MINUTES   BACK SURGERY     x 3   CARDIAC CATHETERIZATION  11/2008   Dr. Anne Fu - 20% calcified non flow limiting left main, 50% EF apical hypokinesis   CARPAL TUNNEL RELEASE Bilateral    CERVICAL DISC ARTHROPLASTY N/A 04/07/2017   Procedure: Cervical six-seven Disc arthroplasty;  Surgeon: Barnett Abu, MD;  Location: [facility];  Service: Neurosurgery;  Laterality: N/A;   CHEST TUBE INSERTION Left 04/11/2017   Procedure: CHEST  TUBE INSERTION;  Surgeon: Kerin Perna, MD;  Location: [facility] OR;  Service: Thoracic;  Laterality: Left;   CHOLECYSTECTOMY     COLONOSCOPY  2016   CG-MAC-miralax(good)servere TICS/TA x 2   ESOPHAGEAL MANOMETRY N/A 05/30/2021   Procedure: ESOPHAGEAL MANOMETRY (EM);  Surgeon: Shellia Cleverly, DO;  Location: [facility];  Service: Gastroenterology;  Laterality: N/A;  ph impedence   ESOPHAGOGASTRODUODENOSCOPY     EYE SURGERY Bilateral 2011   steroidal encapsulation" (04/28/2012)   FOOT SURGERY Right    x 2   JOINT REPLACEMENT     Many surgeries including 5 joit replacements   KNEE ARTHROSCOPY Bilateral    LATERAL / POSTERIOR COMBINED FUSION LUMBAR SPINE  2012   LEFT ATRIAL APPENDAGE OCCLUSION N/A 02/22/2021   Procedure: LEFT ATRIAL APPENDAGE OCCLUSION;  Surgeon: Lanier Prude, MD;  [facility];  Service: Cardiovascular;  Laterality: N/A;   NEUROPLASTY / TRANSPOSITION ULNAR NERVE AT ELBOW Bilateral    POLYPECTOMY  2016   severe TICS/TA x 2   POSTERIOR CERVICAL LAMINECTOMY WITH MET- RX Right 06/11/2021   Procedure: Right Cervical six-seven  Laminectomy and foraminotomy with metrex;  Surgeon: Barnett Abu, MD;  Location: [facility];  Service: Neurosurgery;  Laterality: Right;   REFRACTIVE SURGERY Left 07/07/2023   laser surgery but still has "floaters"   REVERSE SHOULDER ARTHROPLASTY  04/28/2012   Procedure: REVERSE SHOULDER ARTHROPLASTY;  Surgeon: Mable Paris, MD;  Location: [facility] OR;  Service: Orthopedics;  Laterality: Left;  Left shouder reverse total shoulder arthroplasty   SHOULDER SURGERY     SPINE SURGERY     I have had 7 spinal surgeries   TEE WITHOUT CARDIOVERSION N/A 02/22/2021   Procedure: TRANSESOPHAGEAL ECHOCARDIOGRAM (TEE);  Surgeon: Lanier Prude, MD;  Location: [facility] INVASIVE CV LAB;  Service: Cardiovascular;  Laterality: N/A;   TENDON REPAIR Right 08/07/2020   Procedure: RIGHT HAND LIGAMENT RECONSTRUCTION AND TENDON INTERPOSITION;  Surgeon:  Jodi Geralds, MD;  Location: [facility];  Service: Orthopedics;  Laterality: Right;   TONSILLECTOMY AND ADENOIDECTOMY  1954   TOTAL HIP ARTHROPLASTY Left 2011   "left" (04/28/2012)   TOTAL HIP ARTHROPLASTY Right 07/31/2015   Procedure: TOTAL HIP ARTHROPLASTY ANTERIOR APPROACH;  Surgeon: Jodi Geralds, MD;  Location: [facility];  Service: Orthopedics;  Laterality: Right;   TOTAL KNEE ARTHROPLASTY  01/10/2012   Procedure: TOTAL KNEE ARTHROPLASTY;  Surgeon: Harvie Junior, MD;  Location: [facility];  Service: Orthopedics;;  left total knee arthroplasty   TOTAL KNEE ARTHROPLASTY Right 06/17/2014   Procedure: TOTAL KNEE ARTHROPLASTY;  Surgeon: Harvie Junior, MD;  Location: [facility];  Service: Orthopedics;  Laterality: Right;   toupet fundolplication  11/01/2021   TRANSESOPHAGEAL ECHOCARDIOGRAM (CATH LAB) N/A 03/24/2023   Procedure: TRANSESOPHAGEAL ECHOCARDIOGRAM;  Surgeon: Chrystie Nose, MD;  Location: [facility];  Service: Cardiovascular;  Laterality: N/A;   Patient Active Problem List   Diagnosis Date Noted   Hematochezia 07/25/2023   Pain in toe of right fifth foot 06/27/2023   Cellulitis 04/17/2023   Closed fracture of ramus of right pubis (HCC) 04/07/2023   S/P PICC central line placement 03/31/2023   Venous insufficiency of both lower extremities 03/20/2023   Chronic superficial venous thrombosis of both lower extremities 11/18/2022   Venous stasis ulcer (HCC) 09/13/2022   Primary osteoarthritis of right shoulder, status post left shoulder arthroplasty 07/05/2022   Shingles 05/16/2022   Vertigo 04/04/2022   BPH (benign prostatic hyperplasia) 02/21/2022   Annual physical exam 01/22/2022   Charcot's joint of foot, right 01/22/2022   Lower extremity edema 08/08/2021   Heartburn    Atypical chest pain    Cervical radicular pain 06/11/2021   Chronic anticoagulation 03/15/2021   Peripheral neuropathy 02/22/2021   Chronic systolic heart failure (HCC) 02/22/2021   Essential  (primary) hypertension 11/15/2020   Arthritis of carpometacarpal (CMC) joint of right thumb 08/07/2020   COPD (chronic obstructive pulmonary disease) (HCC) 03/21/2020   GERD (gastroesophageal reflux disease) status post Nissen fundoplication 12/03/2017   Lumbar stenosis status post T12-S1 fusion 04/07/2017   Cervical spondylosis with radiculopathy 01/01/2017   Cervical radiculopathy 04/11/2016   History of total right hip arthroplasty 07/31/2015   Chronic low back pain 10/13/2014   Primary osteoarthritis of right knee 06/17/2014   Thrush of mouth and esophagus (HCC) 04/03/2014   Hyperlipidemia    History of colonic polyps 02/09/2013   Paroxysmal atrial fibrillation (HCC) 05/11/2012   Osteoarthritis of left knee 01/10/2012   Seasonal and perennial allergic rhinitis 07/23/2010   Asthma, moderate persistent 03/06/2009    ONSET DATE: "years" MD visit January 2025  REFERRING DIAG: balance problems  THERAPY DIAG:  Muscle weakness (generalized)  Difficulty in walking, not elsewhere classified  Other abnormalities of gait and mobility  Unsteadiness on feet  Rationale for Evaluation and Treatment: Rehabilitation  SUBJECTIVE:                                                                                                                                                                                             SUBJECTIVE STATEMENT: Patient reports he has appointment with podiatrist on Thursday about his toes; states he thinks he will have a "few toes taken off on my L foot and one on my  R foot". Patient states his balance is most challenged by the neuropathy (R>L).  Pt accompanied by: self  PERTINENT HISTORY: bilat hip and knee replacements, Rt charcot foot. 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 He uses a cane for ambulation and reports frequent falls. He reports he is sometimes able to get up after a  fall but occasional requires assistance. He reports he has broken his hips, his ribs and his wrist due to falls. Pt reports he falls when his leg just "gives out".   PAIN:  Are you having pain?  Pt reports "aches and pains all over". He does report Rt foot pain 7/10 due to charcot foot  PRECAUTIONS: Fall Pt having spinal cord stimulator placed 08/08/23  RED FLAGS: None   WEIGHT BEARING RESTRICTIONS: No  FALLS: Has patient fallen in last 6 months? Yes. Number of falls exact number unknown. Pt reports he falls often  LIVING ENVIRONMENT: Lives with: lives with their family Lives in: House/apartment Stairs: No Has following equipment at home: Single point cane  PLOF: Independent with community mobility with device  PATIENT GOALS: reduce falls  OBJECTIVE:  Note: Objective measures were completed at Evaluation unless otherwise noted.    SENSATION: Bilat LE neuropathy    LOWER EXTREMITY MMT:   All tested in sitting MMT Right Eval Left Eval  Hip flexion 3- 3-  Hip extension    Hip abduction 3 3  Hip adduction 3+ 3+  Hip internal rotation    Hip external rotation    Knee flexion 3+ 3-  Knee extension 4 4  Ankle dorsiflexion 3 3  Ankle plantarflexion    Ankle inversion    Ankle eversion    (Blank rows = not tested)   GAIT: Gait pattern: antalgic and wide BOS  Assistive device utilized: Single point cane and performed with SPC and without AD Level of assistance: Modified independence   FUNCTIONAL TESTS:  Timed up and go (TUG): 14.23 seconds without AD Berg Balance Scale: 19/56 BERG BALANCE TEST Sitting to Standing: 2.      Stands using hands after multiple trials Standing Unsupported: 3.      Stands 2 minutes with supervision Sitting Unsupported: 4.     Sits for 2 minutes independently Standing to Sitting: 1.     Sits independently, but uncontrolled descent  Transfers: 3.     Transfers safely definite use of hands Standing with eyes closed: 1.     Unable to  keep eyes closed for 3 seconds, but is safe Standing with feet together: 0.     Needs help to attain position and unable to hold for 15 seconds Reaching forward with outstretched arm: 1.     Reaches forward with supervision Retrieving object from the floor: 3.     Able to pick up with supervision Turning to look behind: 0.     Needs assistance Turning 360 degrees: 0.     Needs assistance Place alternate foot on stool: 1.     Completes >2 steps with minimal assist Standing with one foot in front: 0.     Loses balance while standing/stepping Standing on one foot: 0.     Unable  Total Score: 19/56  [facility] Adult PT Treatment:                                                DATE: 08/04/2023 Therapeutic Exercise: Standing with support: Toe raises --> L assist with strap x15 Heel raises x 15 Squats x10 Hip abd 2x10  HS curls 2x10  Marching x10 Resisted side stepping + RTB crossed at ankles (counter) Hip extension x10 Seated:  Knee extension + RTB x12 (B) Knee flexion + RTB & slider x10 (B) Marching x10 Therapeutic Activity: 6" step foot taps --> step up with reciprocal stepping pattern for dynamic balance  (HHAx2) Diaphragmatic breathing --> cueing for postural awareness and TA activation and progress core stabilization   [facility] Adult PT Treatment:                                                DATE: 07/29/23 Therapeutic Exercise/Activity/NMR: Hip abd 2 x 10 HS curl 2 x 10 Mini squat 2 x 10 Toe raises x 10 LAQ with 3 sec hold x 10, 2# Narrow BOS --> Narrow BOS with head nods and head turns each 2 x 30 sec Gait with head turns/head nods min A Gait with speed changes min A Tap ups 6'' step with min/mod A Step ups 6'' step with 1 UE support x 8 bilat Tapping different color dots with alternating LEs - 1 UE support   TREATMENT DATE: 07/23/23 See HEP PT educated  pt on PT POC and goals, rationale for treatment    PATIENT EDUCATION: Education details: Updated HEP Person educated: Patient Education method: Explanation, Demonstration, and Handouts Education comprehension: verbalized understanding and returned demonstration  HOME EXERCISE PROGRAM: Access Code: [email or web address] URL: [email or web address] Date: 08/04/2023 Prepared by: Carlynn Herald  Exercises - Standing Hip Abduction with Counter Support  - 1 x daily - 7 x weekly - 3 sets - 10 reps - Standing Knee Flexion with Counter Support  - 1 x daily - 7 x weekly - 3 sets - 10 reps - Mini Squat with Counter Support  - 1 x daily - 7 x weekly - 3 sets - 10 reps - Heel Toe Raises with Counter Support  - 1 x daily - 7 x weekly - 3 sets - 10 reps - Narrow Stance with Counter Support  - 1 x daily - 7 x weekly - 1 sets - 3 reps - 30 seconds hold - Seated March  - 1 x daily - 7 x weekly - 3 sets - 10 reps - Seated Knee Extension with Resistance  - 1 x daily - 7 x weekly - 3 sets - 10 reps - Seated Hamstring Curl with Anchored Resistance  - 1 x daily - 7 x weekly - 3 sets - 10 reps - Seated Diaphragmatic Breathing  - 1 x daily - 7 x weekly - 3 sets - 10 reps - Seated Heel Toe Raises  - 1 x daily - 7 x weekly - 3 sets - 10 reps  GOALS: Goals reviewed with patient? Yes  SHORT TERM GOALS: Target date: 08/20/2023  Pt will be independent with initial HEP Baseline: Goal status: INITIAL  2.  Pt will improve TUG to <= 13 seconds Baseline:  Goal status:  INITIAL    LONG TERM GOALS: Target date: 09/17/2023  Pt will be independent in advanced HEP Baseline:  Goal status: INITIAL  2.  Pt will improve BERG balance test to >= 29/56 to demo decreased risk of falls Baseline:  Goal status: INITIAL  3.  Pt will perform floor to chair transfer with supervision Baseline:  Goal status: INITIAL  4.  Pt will improve LE strength to 4/5 bilat Baseline:  Goal status:  INITIAL    ASSESSMENT:  CLINICAL IMPRESSION: Resistance added with side stepping to challenge eccentric control and progress hip strengthening. Seated exercises added to incorporate NWB exercises for LE and added to HEP for post-surgery. Cueing provided to improve squat mechanics and posterior weight shifting.    OBJECTIVE IMPAIRMENTS: Abnormal gait, decreased activity tolerance, decreased balance, difficulty walking, and decreased strength.   PLAN:  PT FREQUENCY: 2x/week  PT DURATION: 8 weeks  PLANNED INTERVENTIONS: 97164- PT Re-evaluation, 97110-Therapeutic exercises, 97530- Therapeutic activity, 97112- Neuromuscular re-education, 97535- Self Care, 16109- Manual therapy, [phone]- Gait training, [phone]- Aquatic Therapy, Patient/Family education, Balance training, and Taping  PLAN FOR NEXT SESSION: Balance, gait, floor transfers   Sanjuana Mae, PTA 08/04/2023, 8:42 AM

## 2023-08-06 ENCOUNTER — Ambulatory Visit: Payer: Medicare Other | Admitting: Physical Therapy

## 2023-08-06 ENCOUNTER — Encounter: Payer: Self-pay | Admitting: Physical Therapy

## 2023-08-06 DIAGNOSIS — R2681 Unsteadiness on feet: Secondary | ICD-10-CM | POA: Diagnosis not present

## 2023-08-06 DIAGNOSIS — M6281 Muscle weakness (generalized): Secondary | ICD-10-CM | POA: Diagnosis not present

## 2023-08-06 DIAGNOSIS — T8133XD Disruption of traumatic injury wound repair, subsequent encounter: Secondary | ICD-10-CM | POA: Diagnosis not present

## 2023-08-06 DIAGNOSIS — R262 Difficulty in walking, not elsewhere classified: Secondary | ICD-10-CM | POA: Diagnosis not present

## 2023-08-06 DIAGNOSIS — L97929 Non-pressure chronic ulcer of unspecified part of left lower leg with unspecified severity: Secondary | ICD-10-CM | POA: Diagnosis not present

## 2023-08-06 DIAGNOSIS — I872 Venous insufficiency (chronic) (peripheral): Secondary | ICD-10-CM | POA: Diagnosis not present

## 2023-08-06 DIAGNOSIS — S81802A Unspecified open wound, left lower leg, initial encounter: Secondary | ICD-10-CM | POA: Diagnosis not present

## 2023-08-06 DIAGNOSIS — R600 Localized edema: Secondary | ICD-10-CM | POA: Diagnosis not present

## 2023-08-06 DIAGNOSIS — I83029 Varicose veins of left lower extremity with ulcer of unspecified site: Secondary | ICD-10-CM | POA: Diagnosis not present

## 2023-08-06 DIAGNOSIS — R2689 Other abnormalities of gait and mobility: Secondary | ICD-10-CM | POA: Diagnosis not present

## 2023-08-06 NOTE — Therapy (Addendum)
 OUTPATIENT PHYSICAL THERAPY NEURO TREATMENT AND DISCHARGE   Patient Name: Levi [name] MRN: [id number] DOB:1945-07-29, 78 y.o., male Today's Date: 08/06/2023   PCP: Tye Gall REFERRING PROVIDER: Tye Gall  END OF SESSION:  PT End of Session - 08/06/23 1220     Visit Number 4    Number of Visits 16    Date for PT Re-Evaluation 09/17/23    Authorization Type Medicare    Authorization - Visit Number 3    Progress Note Due on Visit 10    PT Start Time 1100    PT Stop Time 1143    PT Time Calculation (min) 43 min    Equipment Utilized During Treatment Gait belt    Activity Tolerance Patient tolerated treatment well    Behavior During Therapy WFL for tasks assessed/performed             Past Medical History:  Diagnosis Date   Agent orange exposure 1970's   takes Imdur ,Diltiazem , and Atacand  daily   Agent orange exposure    ALLERGIC RHINITIS    Allergy     seasonal allergies   Anemia    iron  deficiency   Asthma    uses inhaler and nebulizers   Atrial fibrillation (HCC)    Bruises easily    d/t prednisone  daily   Cataract    Cellulitis 03/22/2020   Chest wall pain 09/21/2019   L scapula, reported 4/27 21/     CHF (congestive heart failure) (HCC)    dx-on meds to treat   Chronic bronchitis (HCC)    "used to get it q yr; last time ?2008" (04/28/2012)   Clotting disorder (HCC) 2019   On blood thinner when I bleed it is for a consideral amount of time. This is tegardless of size of wound.   COPD (chronic obstructive pulmonary disease) (HCC)    agent orange exposure   Degenerative disk disease    "everywhere" (04/28/2012)   Diverticulitis    Dysrhythmia    afib   Elevated uric acid in blood    takes Allopurinol  daily   Emphysema of lung (HCC)    Enlarged prostate    but not on any meds   GERD (gastroesophageal reflux disease)    takes Protonix  daily   H/O hiatal hernia    Headache    History of colonoscopy    History of pneumonia     a. 2010   Hyperlipidemia    takes Lipitor daily; pt. states he takes a preventive   Hypothyroidism    Joint pain    Joint swelling    Leukocytosis    MSSA bacteremia 03/19/2023   Nausea and vomiting 11/12/2022   Neuromuscular disorder (HCC)    bilat legs, and bilat arms   Neuropathy    Osteoporosis 2012   PAF (paroxysmal atrial fibrillation) (HCC)    a. Dx 04/2012, CHA2DS2VASc = 1 (age);  b. 04/2012 Echo: EF 40-50%, mild MR.   Personal history of colonic adenomas 02/09/2013   Pneumonia    Sepsis (HCC) 03/18/2023   Shortness of breath dyspnea    Thyroid  disease    no taking meds-caused dizziness   Past Surgical History:  Procedure Laterality Date   65 HOUR PH STUDY N/A 05/30/2021   Procedure: 24 HOUR PH STUDY;  Surgeon: Annis Kinder, DO;  Location: [facility];  Service: Gastroenterology;  Laterality: N/A;   ANTERIOR CERVICAL DECOMP/DISCECTOMY FUSION N/A 07/21/2012   Procedure: ANTERIOR CERVICAL DECOMPRESSION/DISCECTOMY FUSION 2 LEVELS;  Surgeon: Elna Haggis,  MD;  Location: [facility];  Service: Neurosurgery;  Laterality: N/A;  C4-5 C5-6 Anterior cervical decompression/diskectomy/fusion   ANTERIOR LAT LUMBAR FUSION N/A 04/07/2017   Procedure: Thoracic twelve-Lumbar one Anterolateral decompression/fusion;  Surgeon: Elna Haggis, MD;  Location: [facility];  Service: Neurosurgery;  Laterality: N/A;   ARTHRODESIS FOOT WITH WEIL OSTEOTOMY Left 11/07/2022   Procedure: LEFT THIRD METATARSOPHALANGEAL CAPSULOTOMY, WEIL OSTEOTOMY;  Surgeon: Donnamarie Gables, MD;  Location: [facility] OR;  Service: Orthopedics;  Laterality: Left;  LENGTH OF SURGERY: 60 MINUTES   BACK SURGERY     x 3   CARDIAC CATHETERIZATION  11/2008   Dr. Renna Cary - 20% calcified non flow limiting left main, 50% EF apical hypokinesis   CARPAL TUNNEL RELEASE Bilateral    CERVICAL DISC ARTHROPLASTY N/A 04/07/2017   Procedure: Cervical six-seven Disc arthroplasty;  Surgeon: Elna Haggis, MD;  Location: [facility];  Service:  Neurosurgery;  Laterality: N/A;   CHEST TUBE INSERTION Left 04/11/2017   Procedure: CHEST TUBE INSERTION;  Surgeon: Heriberto London, MD;  Location: [facility] OR;  Service: Thoracic;  Laterality: Left;   CHOLECYSTECTOMY     COLONOSCOPY  2016   CG-MAC-miralax (good)servere TICS/TA x 2   ESOPHAGEAL MANOMETRY N/A 05/30/2021   Procedure: ESOPHAGEAL MANOMETRY (EM);  Surgeon: Annis Kinder, DO;  Location: [facility];  Service: Gastroenterology;  Laterality: N/A;  ph impedence   ESOPHAGOGASTRODUODENOSCOPY     EYE SURGERY Bilateral 2011   steroidal encapsulation" (04/28/2012)   FOOT SURGERY Right    x 2   JOINT REPLACEMENT     Many surgeries including 5 joit replacements   KNEE ARTHROSCOPY Bilateral    LATERAL / POSTERIOR COMBINED FUSION LUMBAR SPINE  2012   LEFT ATRIAL APPENDAGE OCCLUSION N/A 02/22/2021   Procedure: LEFT ATRIAL APPENDAGE OCCLUSION;  Surgeon: Boyce Byes, MD;  [facility];  Service: Cardiovascular;  Laterality: N/A;   NEUROPLASTY / TRANSPOSITION ULNAR NERVE AT ELBOW Bilateral    POLYPECTOMY  2016   severe TICS/TA x 2   POSTERIOR CERVICAL LAMINECTOMY WITH MET- RX Right 06/11/2021   Procedure: Right Cervical six-seven  Laminectomy and foraminotomy with metrex;  Surgeon: Elna Haggis, MD;  Location: [facility];  Service: Neurosurgery;  Laterality: Right;   REFRACTIVE SURGERY Left 07/07/2023   laser surgery but still has "floaters"   REVERSE SHOULDER ARTHROPLASTY  04/28/2012   Procedure: REVERSE SHOULDER ARTHROPLASTY;  Surgeon: Derald Flattery, MD;  Location: [facility] OR;  Service: Orthopedics;  Laterality: Left;  Left shouder reverse total shoulder arthroplasty   SHOULDER SURGERY     SPINE SURGERY     I have had 7 spinal surgeries   TEE WITHOUT CARDIOVERSION N/A 02/22/2021   Procedure: TRANSESOPHAGEAL ECHOCARDIOGRAM (TEE);  Surgeon: Boyce Byes, MD;  Location: [facility] INVASIVE CV LAB;  Service: Cardiovascular;  Laterality: N/A;   TENDON REPAIR Right  08/07/2020   Procedure: RIGHT HAND LIGAMENT RECONSTRUCTION AND TENDON INTERPOSITION;  Surgeon: Neil Balls, MD;  Location: [facility];  Service: Orthopedics;  Laterality: Right;   TONSILLECTOMY AND ADENOIDECTOMY  1954   TOTAL HIP ARTHROPLASTY Left 2011   "left" (04/28/2012)   TOTAL HIP ARTHROPLASTY Right 07/31/2015   Procedure: TOTAL HIP ARTHROPLASTY ANTERIOR APPROACH;  Surgeon: Neil Balls, MD;  Location: [facility];  Service: Orthopedics;  Laterality: Right;   TOTAL KNEE ARTHROPLASTY  01/10/2012   Procedure: TOTAL KNEE ARTHROPLASTY;  Surgeon: Boston Byers, MD;  Location: [facility];  Service: Orthopedics;;  left total knee arthroplasty   TOTAL  KNEE ARTHROPLASTY Right 06/17/2014   Procedure: TOTAL KNEE ARTHROPLASTY;  Surgeon: Boston Byers, MD;  Location: [facility];  Service: Orthopedics;  Laterality: Right;   toupet fundolplication  11/01/2021   TRANSESOPHAGEAL ECHOCARDIOGRAM (CATH LAB) N/A 03/24/2023   Procedure: TRANSESOPHAGEAL ECHOCARDIOGRAM;  Surgeon: Hazle Lites, MD;  [facility]: [facility];  Service: Cardiovascular;  Laterality: N/A;   Patient Active Problem List   Diagnosis Date Noted   Hematochezia 07/25/2023   Pain in toe of right fifth foot 06/27/2023   Cellulitis 04/17/2023   Closed fracture of ramus of right pubis (HCC) 04/07/2023   S/P PICC central line placement 03/31/2023   Venous insufficiency of both lower extremities 03/20/2023   Chronic superficial venous thrombosis of both lower extremities 11/18/2022   Venous stasis ulcer (HCC) 09/13/2022   Primary osteoarthritis of right shoulder, status post left shoulder arthroplasty 07/05/2022   Shingles 05/16/2022   Vertigo 04/04/2022   BPH (benign prostatic hyperplasia) 02/21/2022   Annual physical exam 01/22/2022   Charcot's joint of foot, right 01/22/2022   Lower extremity edema 08/08/2021   Heartburn    Atypical chest pain    Cervical radicular pain 06/11/2021   Chronic anticoagulation 03/15/2021    Peripheral neuropathy 02/22/2021   Chronic systolic heart failure (HCC) 02/22/2021   Essential (primary) hypertension 11/15/2020   Arthritis of carpometacarpal (CMC) joint of right thumb 08/07/2020   COPD (chronic obstructive pulmonary disease) (HCC) 03/21/2020   GERD (gastroesophageal reflux disease) status post Nissen fundoplication 12/03/2017   Lumbar stenosis status post T12-S1 fusion 04/07/2017   Cervical spondylosis with radiculopathy 01/01/2017   Cervical radiculopathy 04/11/2016   History of total right hip arthroplasty 07/31/2015   Chronic low back pain 10/13/2014   Primary osteoarthritis of right knee 06/17/2014   Thrush of mouth and esophagus (HCC) 04/03/2014   Hyperlipidemia    History of colonic polyps 02/09/2013   Paroxysmal atrial fibrillation (HCC) 05/11/2012   Osteoarthritis of left knee 01/10/2012   Seasonal and perennial allergic rhinitis 07/23/2010   Asthma, moderate persistent 03/06/2009    ONSET DATE: "years" MD visit January 2025  REFERRING DIAG: balance problems  THERAPY DIAG:  Muscle weakness (generalized)  Difficulty in walking, not elsewhere classified  Rationale for Evaluation and Treatment: Rehabilitation  SUBJECTIVE:                                                                                                                                                                                             SUBJECTIVE STATEMENT: Pt states his spinal stimulator placement was cancelled due to infection in his leg. He is hoping to get his toes  amputated and then have the spinal stimulator placed in a few months. Pt accompanied by: self  PERTINENT HISTORY: bilat hip and knee replacements, Rt charcot foot. 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 He uses a cane for ambulation and reports frequent falls. He reports he is sometimes able to get up after a fall but occasional  requires assistance. He reports he has broken his hips, his ribs and his wrist due to falls. Pt reports he falls when his leg just "gives out".   PAIN:  Are you having pain?  Pt reports "aches and pains all over". He does report Rt foot pain 7/10 due to charcot foot  PRECAUTIONS: Fall Pt having spinal cord stimulator placed 08/08/23  RED FLAGS: None   WEIGHT BEARING RESTRICTIONS: No  FALLS: Has patient fallen in last 6 months? Yes. Number of falls exact number unknown. Pt reports he falls often  LIVING ENVIRONMENT: Lives with: lives with their family Lives in: House/apartment Stairs: No Has following equipment at home: Single point cane  PLOF: Independent with community mobility with device  PATIENT GOALS: reduce falls  OBJECTIVE:  Note: Objective measures were completed at Evaluation unless otherwise noted.    SENSATION: Bilat LE neuropathy    LOWER EXTREMITY MMT:   All tested in sitting MMT Right Eval Left Eval  Hip flexion 3- 3-  Hip extension    Hip abduction 3 3  Hip adduction 3+ 3+  Hip internal rotation    Hip external rotation    Knee flexion 3+ 3-  Knee extension 4 4  Ankle dorsiflexion 3 3  Ankle plantarflexion    Ankle inversion    Ankle eversion    (Blank rows = not tested)   GAIT: Gait pattern: antalgic and wide BOS  Assistive device utilized: Single point cane and performed with SPC and without AD Level of assistance: Modified independence   FUNCTIONAL TESTS:  Timed up and go (TUG): 14.23 seconds without AD Berg Balance Scale: 19/56 BERG BALANCE TEST Sitting to Standing: 2.      Stands using hands after multiple trials Standing Unsupported: 3.      Stands 2 minutes with supervision Sitting Unsupported: 4.     Sits for 2 minutes independently Standing to Sitting: 1.     Sits independently, but uncontrolled descent  Transfers: 3.     Transfers safely definite use of hands Standing with eyes closed: 1.     Unable to keep eyes closed for  3 seconds, but is safe Standing with feet together: 0.     Needs help to attain position and unable to hold for 15 seconds Reaching forward with outstretched arm: 1.     Reaches forward with supervision Retrieving object from the floor: 3.     Able to pick up with supervision Turning to look behind: 0.     Needs assistance Turning 360 degrees: 0.     Needs assistance Place alternate foot on stool: 1.     Completes >2 steps with minimal assist Standing with one foot in front: 0.     Loses balance while standing/stepping Standing on one foot: 0.     Unable  Total Score: 19/56  [facility] Adult PT Treatment:                                                DATE: 08/06/23 Therapeutic Exercise/Activity/NMR: Toe raises --> L assist with strap x15 Heel raises x 15 Squats x10 Hip abd red TB 2 x 10 HS curl red TB 2 x 10 Hip ext red TB 2 x 10 Tap ups reciprocal 6'' step with 1 UE support Standing with 1 LE on 6'' step 1 LE on ground without UE support 2 x 30 sec bilat Tapping different color dots with alternating LEs - 1 UE support Gait with head turns/head nods min A Gait with speed changes min A Gait sidestep and backwards with min A   [facility] Adult PT Treatment:                                                DATE: 08/04/2023 Therapeutic Exercise: Standing with support: Toe raises --> L assist with strap x15 Heel raises x 15 Squats x10 Hip abd 2x10  HS curls 2x10  Marching x10 Resisted side stepping + RTB crossed at ankles (counter) Hip extension x10 Seated:  Knee extension + RTB x12 (B) Knee flexion + RTB & slider x10 (B) Marching x10 Therapeutic Activity: 6" step foot taps --> step up with reciprocal stepping pattern for dynamic balance  (HHAx2) Diaphragmatic breathing --> cueing for postural awareness and TA activation and progress core stabilization   [facility]  Adult PT Treatment:                                                DATE: 07/29/23 Therapeutic Exercise/Activity/NMR: Hip abd 2 x 10 HS curl 2 x 10 Mini squat 2 x 10 Toe raises x 10 LAQ with 3 sec hold x 10, 2# Narrow BOS --> Narrow BOS with head nods and head turns each 2 x 30 sec Gait with head turns/head nods min A Gait with speed changes min A Tap ups 6'' step with min/mod A Step ups 6'' step with 1 UE support x 8 bilat Tapping different color dots with alternating LEs - 1 UE support   TREATMENT DATE: 07/23/23 See HEP PT educated pt on PT POC and goals, rationale for treatment    PATIENT EDUCATION: Education details: Updated HEP Person educated: Patient Education method: Explanation, Demonstration, and Handouts Education comprehension: verbalized understanding and returned demonstration  HOME EXERCISE PROGRAM: Access Code: [email or web address] URL: [email or web address] Date: 08/04/2023 Prepared by: Sims Duck  Exercises - Standing Hip Abduction with Counter Support  - 1 x daily - 7 x weekly - 3 sets - 10 reps - Standing Knee Flexion with Counter Support  - 1 x daily - 7 x weekly - 3 sets - 10 reps - Mini Squat with Counter Support  - 1 x daily - 7 x weekly - 3 sets - 10 reps - Heel Toe Raises with Counter Support  - 1 x daily - 7 x weekly - 3 sets - 10 reps - Narrow Stance with Counter Support  - 1 x daily - 7 x weekly -  1 sets - 3 reps - 30 seconds hold - Seated March  - 1 x daily - 7 x weekly - 3 sets - 10 reps - Seated Knee Extension with Resistance  - 1 x daily - 7 x weekly - 3 sets - 10 reps - Seated Hamstring Curl with Anchored Resistance  - 1 x daily - 7 x weekly - 3 sets - 10 reps - Seated Diaphragmatic Breathing  - 1 x daily - 7 x weekly - 3 sets - 10 reps - Seated Heel Toe Raises  - 1 x daily - 7 x weekly - 3 sets - 10 reps  GOALS: Goals reviewed with patient? Yes  SHORT TERM GOALS: Target date: 08/20/2023  Pt will be independent with initial  HEP Baseline: Goal status: INITIAL  2.  Pt will improve TUG to <= 13 seconds Baseline:  Goal status: INITIAL    LONG TERM GOALS: Target date: 09/17/2023  Pt will be independent in advanced HEP Baseline:  Goal status: INITIAL  2.  Pt will improve BERG balance test to >= 29/56 to demo decreased risk of falls Baseline:  Goal status: INITIAL  3.  Pt will perform floor to chair transfer with supervision Baseline:  Goal status: INITIAL  4.  Pt will improve LE strength to 4/5 bilat Baseline:  Goal status: INITIAL    ASSESSMENT:  CLINICAL IMPRESSION: Pt with improved posture and balance during gait today. Able to progress to backwards walking without UE support with min A today. He has good tolerance to all strengthening activities. Challenged by tapping dots for coordination and balance requiring 1 UE assist and min A. He is to follow up about having toe amputations and then return to physical therapy as soon as cleared after surgery   OBJECTIVE IMPAIRMENTS: Abnormal gait, decreased activity tolerance, decreased balance, difficulty walking, and decreased strength.   PLAN:  PT FREQUENCY: 2x/week  PT DURATION: 8 weeks  PLANNED INTERVENTIONS: 97164- PT Re-evaluation, 97110-Therapeutic exercises, 97530- Therapeutic activity, 97112- Neuromuscular re-education, 97535- Self Care, 58527- Manual therapy, [phone]- Gait training, [phone]- Aquatic Therapy, Patient/Family education, Balance training, and Taping  PLAN FOR NEXT SESSION: Balance, gait, floor transfers   Jie Stickels, PT 08/06/2023, 12:25 PM   PHYSICAL THERAPY DISCHARGE SUMMARY  Visits from Start of Care: 4  Current functional level related to goals / functional outcomes: Improving balance and activity tolerance   Remaining deficits: See above   Education / Equipment: HEP   Patient agrees to discharge. Patient goals were not met. Patient is being discharged due to a change in medical status.  Lowery Rue,  PT,DPT04/25/258:46 AM

## 2023-08-07 ENCOUNTER — Ambulatory Visit (INDEPENDENT_AMBULATORY_CARE_PROVIDER_SITE_OTHER): Payer: Medicare Other | Admitting: Podiatry

## 2023-08-07 ENCOUNTER — Other Ambulatory Visit: Payer: Self-pay | Admitting: Podiatry

## 2023-08-07 ENCOUNTER — Encounter: Payer: Self-pay | Admitting: Podiatry

## 2023-08-07 ENCOUNTER — Telehealth: Payer: Self-pay | Admitting: Urology

## 2023-08-07 DIAGNOSIS — G6289 Other specified polyneuropathies: Secondary | ICD-10-CM | POA: Diagnosis not present

## 2023-08-07 DIAGNOSIS — L97524 Non-pressure chronic ulcer of other part of left foot with necrosis of bone: Secondary | ICD-10-CM

## 2023-08-07 DIAGNOSIS — M14671 Charcot's joint, right ankle and foot: Secondary | ICD-10-CM | POA: Diagnosis not present

## 2023-08-07 NOTE — Addendum Note (Signed)
 Addended by: Daryel November on: 08/07/2023 10:02 AM   Modules accepted: Orders

## 2023-08-07 NOTE — Progress Notes (Signed)
 Subjective:  Patient ID: Levi [name], male    DOB: Aug 14, 1945,   MRN: [id number]  No chief complaint on file.   78 y.o. male presents for new concern of left second toe wound. He has been dressing as instructed. Relates not much improvement.  He is currently on bactrim and azithromycin.   Denies any other pedal complaints. Denies n/v/f/c.   Past Medical History:  Diagnosis Date   Agent orange exposure 1970's   takes Imdur,Diltiazem, and Atacand daily   Agent orange exposure    ALLERGIC RHINITIS    Allergy    seasonal allergies   Anemia    iron deficiency   Asthma    uses inhaler and nebulizers   Atrial fibrillation (HCC)    Bruises easily    d/t prednisone daily   Cataract    Cellulitis 03/22/2020   Chest wall pain 09/21/2019   L scapula, reported 4/27 21/     CHF (congestive heart failure) (HCC)    dx-on meds to treat   Chronic bronchitis (HCC)    "used to get it q yr; last time ?2008" (04/28/2012)   Clotting disorder (HCC) 2019   On blood thinner when I bleed it is for a consideral amount of time. This is tegardless of size of wound.   COPD (chronic obstructive pulmonary disease) (HCC)    agent orange exposure   Degenerative disk disease    "everywhere" (04/28/2012)   Diverticulitis    Dysrhythmia    afib   Elevated uric acid in blood    takes Allopurinol daily   Emphysema of lung (HCC)    Enlarged prostate    but not on any meds   GERD (gastroesophageal reflux disease)    takes Protonix daily   H/O hiatal hernia    Headache    History of colonoscopy    History of pneumonia    a. 2010   Hyperlipidemia    takes Lipitor daily; pt. states he takes a preventive   Hypothyroidism    Joint pain    Joint swelling    Leukocytosis    MSSA bacteremia 03/19/2023   Nausea and vomiting 11/12/2022   Neuromuscular disorder (HCC)    bilat legs, and bilat arms   Neuropathy    Osteoporosis 2012   PAF (paroxysmal atrial fibrillation) (HCC)    a. Dx 04/2012, CHA2DS2VASc  = 1 (age);  b. 04/2012 Echo: EF 40-50%, mild MR.   Personal history of colonic adenomas 02/09/2013   Pneumonia    Sepsis (HCC) 03/18/2023   Shortness of breath dyspnea    Thyroid disease    no taking meds-caused dizziness    Objective:  Physical Exam: Vascular: DP/PT pulses 2/4 bilateral. CFT <3 seconds. Normal hair growth on digits. No edema.  Skin. No lacerations or abrasions bilateral feet. Left leg wrapped from wound care. Mild eccymosis noted to distal third digit and fifth digit. Open ulceration noted to dorsum of left second digit about 0.5 cm x 0.4 cm x 0.2 cm with tendon exposed. Some purulent type drainage noted today. Probe to bone.  Musculoskeletal: MMT 5/5 bilateral lower extremities in DF, PF, Inversion and Eversion. Deceased ROM in DF of ankle joint. Severe rocker bottom deformity of right foot with abduction of forefoot. No pain to palpation currently.  Neurological: Sensation intact to light touch.   Assessment:   1. 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 Charcot's joint of foot, right  3. Other polyneuropathy       Plan:  Patient was evaluated and treated and all questions answered. Discussed neuropathy and charcot arthropahty and etiology as well as treatment with patient.  X-rays reviewed. No acute fractures or dislocations. No osseous erosions noted.  Ulcer left second digit with fat layer exposed  -Purulence noted today with no improvement. Concern today for potential osteomyelitis. Does appear stable but concern for non healing and worsening of wound over time.  -Discussed his best option at this time would be amputation of the toe. He did express understanding and would like to proceed. Did discuss admission to hospital vs outpatient. Patient would like to try outpatient although understands if any worsening symptoms to head to the ED.  -Dressed with betadine, DSD. Will use medihoney at home.  -Off-loading with surgical  shoe. -Wound culture taken with swab.  -Currently taking bactrim and following with ID for sepsis and left leg cellulitis.  -Discussed glucose control and proper protein-rich diet.  -Discussed if any worsening redness, pain, fever or chills to call or may need to report to the emergency room. Patient expressed understanding.  -Informed surgical risk consent was reviewed and read aloud to the patient.  I reviewed the films.  I have discussed my findings with the patient in great detail.  I have discussed all risks including but not limited to infection, stiffness, scarring, limp, disability, deformity, damage to blood vessels and nerves, numbness, poor healing, need for braces, arthritis, chronic pain, amputation, death.  All benefits and realistic expectations discussed in great detail.  I have made no promises as to the outcome.  I have provided realistic expectations.  I have offered the patient a 2nd opinion, which they have declined and assured me they preferred to proceed despite the risks. Plan for surgery 3/18       No follow-ups on file.    Louann Sjogren, DPM

## 2023-08-07 NOTE — Telephone Encounter (Signed)
 DOS 08/12/23  AMPUTATION MPJ 2ND LEFT --- 28820  SPOKE WITH JESSICA WITH [employer] AND SHE STATED THAT FOR CPT CODE 32440 NO PRIOR AUTH IS REQUIRED.  CALL REF # [id number] - [id number]

## 2023-08-08 ENCOUNTER — Encounter (HOSPITAL_COMMUNITY): Payer: Self-pay | Admitting: Vascular Surgery

## 2023-08-08 ENCOUNTER — Ambulatory Visit (HOSPITAL_COMMUNITY): Admission: RE | Admit: 2023-08-08 | Payer: Medicare Other | Source: Home / Self Care | Admitting: Pain Medicine

## 2023-08-08 ENCOUNTER — Telehealth: Payer: Self-pay

## 2023-08-08 ENCOUNTER — Encounter (HOSPITAL_COMMUNITY): Admission: RE | Payer: Self-pay | Source: Home / Self Care

## 2023-08-08 SURGERY — INSERTION, SPINAL CORD STIMULATOR, LUMBAR
Anesthesia: General

## 2023-08-08 NOTE — Telephone Encounter (Signed)
 Patient has been off his blood thinner in prep for surgery - surgery was postponed until next week - he is just going to stay off the blood thinners. He is just checking to make sure that's ok

## 2023-08-12 ENCOUNTER — Other Ambulatory Visit: Payer: Self-pay | Admitting: Podiatry

## 2023-08-12 ENCOUNTER — Encounter (HOSPITAL_COMMUNITY): Payer: Self-pay

## 2023-08-12 ENCOUNTER — Other Ambulatory Visit: Payer: Self-pay

## 2023-08-12 ENCOUNTER — Inpatient Hospital Stay (HOSPITAL_COMMUNITY)
Admission: EM | Admit: 2023-08-12 | Discharge: 2023-08-14 | DRG: 504 | Disposition: A | Discharge status: Home / Self Care | Attending: Internal Medicine | Admitting: Internal Medicine

## 2023-08-12 DIAGNOSIS — I83025 Varicose veins of left lower extremity with ulcer other part of foot: Secondary | ICD-10-CM | POA: Diagnosis present

## 2023-08-12 DIAGNOSIS — Z800 Family history of malignant neoplasm of digestive organs: Secondary | ICD-10-CM

## 2023-08-12 DIAGNOSIS — G629 Polyneuropathy, unspecified: Secondary | ICD-10-CM | POA: Diagnosis present

## 2023-08-12 DIAGNOSIS — K219 Gastro-esophageal reflux disease without esophagitis: Secondary | ICD-10-CM | POA: Diagnosis present

## 2023-08-12 DIAGNOSIS — M869 Osteomyelitis, unspecified: Secondary | ICD-10-CM | POA: Diagnosis present

## 2023-08-12 DIAGNOSIS — Z96612 Presence of left artificial shoulder joint: Secondary | ICD-10-CM | POA: Diagnosis present

## 2023-08-12 DIAGNOSIS — Z823 Family history of stroke: Secondary | ICD-10-CM

## 2023-08-12 DIAGNOSIS — Z96643 Presence of artificial hip joint, bilateral: Secondary | ICD-10-CM | POA: Diagnosis present

## 2023-08-12 DIAGNOSIS — Z89421 Acquired absence of other right toe(s): Secondary | ICD-10-CM | POA: Diagnosis not present

## 2023-08-12 DIAGNOSIS — N400 Enlarged prostate without lower urinary tract symptoms: Secondary | ICD-10-CM | POA: Diagnosis present

## 2023-08-12 DIAGNOSIS — J4489 Other specified chronic obstructive pulmonary disease: Secondary | ICD-10-CM | POA: Diagnosis present

## 2023-08-12 DIAGNOSIS — Z89422 Acquired absence of other left toe(s): Secondary | ICD-10-CM | POA: Diagnosis not present

## 2023-08-12 DIAGNOSIS — Z8249 Family history of ischemic heart disease and other diseases of the circulatory system: Secondary | ICD-10-CM

## 2023-08-12 DIAGNOSIS — Z83719 Family history of colon polyps, unspecified: Secondary | ICD-10-CM

## 2023-08-12 DIAGNOSIS — Z7901 Long term (current) use of anticoagulants: Secondary | ICD-10-CM | POA: Diagnosis not present

## 2023-08-12 DIAGNOSIS — M86172 Other acute osteomyelitis, left ankle and foot: Secondary | ICD-10-CM | POA: Diagnosis not present

## 2023-08-12 DIAGNOSIS — Z9889 Other specified postprocedural states: Secondary | ICD-10-CM | POA: Diagnosis not present

## 2023-08-12 DIAGNOSIS — I5022 Chronic systolic (congestive) heart failure: Secondary | ICD-10-CM | POA: Diagnosis present

## 2023-08-12 DIAGNOSIS — Z881 Allergy status to other antibiotic agents status: Secondary | ICD-10-CM | POA: Diagnosis not present

## 2023-08-12 DIAGNOSIS — L03032 Cellulitis of left toe: Secondary | ICD-10-CM | POA: Diagnosis present

## 2023-08-12 DIAGNOSIS — Z83438 Family history of other disorder of lipoprotein metabolism and other lipidemia: Secondary | ICD-10-CM

## 2023-08-12 DIAGNOSIS — Z96653 Presence of artificial knee joint, bilateral: Secondary | ICD-10-CM | POA: Diagnosis present

## 2023-08-12 DIAGNOSIS — Z833 Family history of diabetes mellitus: Secondary | ICD-10-CM

## 2023-08-12 DIAGNOSIS — I480 Paroxysmal atrial fibrillation: Secondary | ICD-10-CM | POA: Diagnosis present

## 2023-08-12 DIAGNOSIS — I509 Heart failure, unspecified: Secondary | ICD-10-CM | POA: Diagnosis not present

## 2023-08-12 DIAGNOSIS — Z981 Arthrodesis status: Secondary | ICD-10-CM | POA: Diagnosis not present

## 2023-08-12 DIAGNOSIS — L97524 Non-pressure chronic ulcer of other part of left foot with necrosis of bone: Secondary | ICD-10-CM | POA: Insufficient documentation

## 2023-08-12 DIAGNOSIS — E876 Hypokalemia: Secondary | ICD-10-CM | POA: Diagnosis present

## 2023-08-12 DIAGNOSIS — E039 Hypothyroidism, unspecified: Secondary | ICD-10-CM | POA: Diagnosis present

## 2023-08-12 DIAGNOSIS — E785 Hyperlipidemia, unspecified: Secondary | ICD-10-CM | POA: Diagnosis present

## 2023-08-12 DIAGNOSIS — M79675 Pain in left toe(s): Principal | ICD-10-CM

## 2023-08-12 DIAGNOSIS — Z8701 Personal history of pneumonia (recurrent): Secondary | ICD-10-CM

## 2023-08-12 DIAGNOSIS — L97529 Non-pressure chronic ulcer of other part of left foot with unspecified severity: Secondary | ICD-10-CM | POA: Diagnosis not present

## 2023-08-12 DIAGNOSIS — J439 Emphysema, unspecified: Secondary | ICD-10-CM | POA: Diagnosis present

## 2023-08-12 DIAGNOSIS — I83015 Varicose veins of right lower extremity with ulcer other part of foot: Secondary | ICD-10-CM | POA: Diagnosis present

## 2023-08-12 DIAGNOSIS — Z79899 Other long term (current) drug therapy: Secondary | ICD-10-CM

## 2023-08-12 DIAGNOSIS — L089 Local infection of the skin and subcutaneous tissue, unspecified: Secondary | ICD-10-CM | POA: Diagnosis not present

## 2023-08-12 DIAGNOSIS — L97519 Non-pressure chronic ulcer of other part of right foot with unspecified severity: Secondary | ICD-10-CM | POA: Diagnosis not present

## 2023-08-12 DIAGNOSIS — J449 Chronic obstructive pulmonary disease, unspecified: Secondary | ICD-10-CM | POA: Diagnosis not present

## 2023-08-12 DIAGNOSIS — M14679 Charcot's joint, unspecified ankle and foot: Secondary | ICD-10-CM | POA: Diagnosis not present

## 2023-08-12 DIAGNOSIS — M86671 Other chronic osteomyelitis, right ankle and foot: Secondary | ICD-10-CM | POA: Diagnosis not present

## 2023-08-12 DIAGNOSIS — Z825 Family history of asthma and other chronic lower respiratory diseases: Secondary | ICD-10-CM

## 2023-08-12 DIAGNOSIS — M86171 Other acute osteomyelitis, right ankle and foot: Secondary | ICD-10-CM | POA: Diagnosis not present

## 2023-08-12 DIAGNOSIS — D649 Anemia, unspecified: Secondary | ICD-10-CM | POA: Diagnosis present

## 2023-08-12 DIAGNOSIS — M79671 Pain in right foot: Secondary | ICD-10-CM | POA: Diagnosis present

## 2023-08-12 DIAGNOSIS — J189 Pneumonia, unspecified organism: Secondary | ICD-10-CM | POA: Diagnosis not present

## 2023-08-12 DIAGNOSIS — Z7952 Long term (current) use of systemic steroids: Secondary | ICD-10-CM

## 2023-08-12 DIAGNOSIS — I110 Hypertensive heart disease with heart failure: Secondary | ICD-10-CM | POA: Diagnosis not present

## 2023-08-12 DIAGNOSIS — Z8042 Family history of malignant neoplasm of prostate: Secondary | ICD-10-CM

## 2023-08-12 DIAGNOSIS — Z9049 Acquired absence of other specified parts of digestive tract: Secondary | ICD-10-CM

## 2023-08-12 DIAGNOSIS — M810 Age-related osteoporosis without current pathological fracture: Secondary | ICD-10-CM | POA: Diagnosis present

## 2023-08-12 DIAGNOSIS — M86672 Other chronic osteomyelitis, left ankle and foot: Secondary | ICD-10-CM | POA: Diagnosis not present

## 2023-08-12 DIAGNOSIS — M7989 Other specified soft tissue disorders: Secondary | ICD-10-CM | POA: Diagnosis not present

## 2023-08-12 DIAGNOSIS — M19071 Primary osteoarthritis, right ankle and foot: Secondary | ICD-10-CM | POA: Diagnosis not present

## 2023-08-12 LAB — COMPREHENSIVE METABOLIC PANEL
ALT: 19 U/L (ref 0–44)
AST: 20 U/L (ref 15–41)
Albumin: 3.6 g/dL (ref 3.5–5.0)
Alkaline Phosphatase: 65 U/L (ref 38–126)
Anion gap: 4 — ABNORMAL LOW (ref 5–15)
BUN: 23 mg/dL (ref 8–23)
CO2: 25 mmol/L (ref 22–32)
Calcium: 9.1 mg/dL (ref 8.9–10.3)
Chloride: 110 mmol/L (ref 98–111)
Creatinine, Ser: 0.91 mg/dL (ref 0.61–1.24)
GFR, Estimated: >60 mL/min (ref >60)
Glucose, Bld: 91 mg/dL (ref 70–99)
Potassium: 3.3 mmol/L — ABNORMAL LOW (ref 3.5–5.1)
Sodium: 139 mmol/L (ref 135–145)
Total Bilirubin: 0.8 mg/dL (ref 0.0–1.2)
Total Protein: 6 g/dL — ABNORMAL LOW (ref 6.5–8.1)

## 2023-08-12 LAB — CBC WITH DIFFERENTIAL/PLATELET
Abs Immature Granulocytes: 0.4 10*3/uL — ABNORMAL HIGH (ref 0.00–0.07)
Band Neutrophils: 2 %
Basophils Absolute: 0 10*3/uL (ref 0.0–0.1)
Basophils Relative: 0 %
Eosinophils Absolute: 0 10*3/uL (ref 0.0–0.5)
Eosinophils Relative: 0 %
HCT: 39.3 % (ref 39.0–52.0)
Hemoglobin: 12.4 g/dL — ABNORMAL LOW (ref 13.0–17.0)
Lymphocytes Relative: 22 %
Lymphs Abs: 2 10*3/uL (ref 0.7–4.0)
MCH: 30.1 pg (ref 26.0–34.0)
MCHC: 31.6 g/dL (ref 30.0–36.0)
MCV: 95.4 fL (ref 80.0–100.0)
Metamyelocytes Relative: 2 %
Monocytes Absolute: 0.4 10*3/uL (ref 0.1–1.0)
Monocytes Relative: 4 %
Myelocytes: 2 %
Neutro Abs: 6.4 10*3/uL (ref 1.7–7.7)
Neutrophils Relative %: 68 %
Platelets: 180 10*3/uL (ref 150–400)
RBC: 4.12 MIL/uL — ABNORMAL LOW (ref 4.22–5.81)
RDW: 20.2 % — ABNORMAL HIGH (ref 11.5–15.5)
WBC: 9.1 10*3/uL (ref 4.0–10.5)
nRBC: 0 % (ref 0.0–0.2)

## 2023-08-12 LAB — MRSA NEXT GEN BY PCR, NASAL: MRSA by PCR Next Gen: NOT DETECTED

## 2023-08-12 LAB — I-STAT CG4 LACTIC ACID, ED
Lactic Acid, Venous: 0.8 mmol/L (ref 0.5–1.9)
Lactic Acid, Venous: 0.7 mmol/L (ref 0.5–1.9)

## 2023-08-12 MED ORDER — HYDRALAZINE HCL 20 MG/ML IJ SOLN: 10.0000 mg | Freq: Four times a day (QID) | INTRAMUSCULAR | Status: DC | PRN

## 2023-08-12 MED ORDER — ISOSORBIDE MONONITRATE ER 30 MG PO TB24
30.0000 mg | ORAL_TABLET | Freq: Every day | ORAL | Status: DC
  Administered 2023-08-12 – 2023-08-14 (×3): 30 mg via ORAL
  Filled 2023-08-12 (×3): qty 1

## 2023-08-12 MED ORDER — FERROUS GLUCONATE 324 (38 FE) MG PO TABS
324.0000 mg | ORAL_TABLET | Freq: Every day | ORAL | Status: DC
  Administered 2023-08-13 – 2023-08-14 (×3): 324 mg via ORAL
  Filled 2023-08-12 (×3): qty 1

## 2023-08-12 MED ORDER — ONDANSETRON HCL 4 MG PO TABS: 4.0000 mg | ORAL_TABLET | Freq: Three times a day (TID) | ORAL | 0 refills | Status: DC | PRN

## 2023-08-12 MED ORDER — DULOXETINE HCL 30 MG PO CPEP
30.0000 mg | ORAL_CAPSULE | Freq: Every day | ORAL | Status: DC
  Administered 2023-08-12 – 2023-08-14 (×3): 30 mg via ORAL
  Filled 2023-08-12 (×3): qty 1

## 2023-08-12 MED ORDER — HYDROMORPHONE HCL 1 MG/ML IJ SOLN: 0.5000 mg | INTRAMUSCULAR | Status: DC | PRN

## 2023-08-12 MED ORDER — POLYETHYLENE GLYCOL 3350 17 G PO PACK: 17.0000 g | PACK | Freq: Every day | ORAL | Status: DC | PRN

## 2023-08-12 MED ORDER — OXYCODONE-ACETAMINOPHEN 5-325 MG PO TABS: 1.0000 | ORAL_TABLET | ORAL | 0 refills | Status: DC | PRN

## 2023-08-12 MED ORDER — OXYCODONE HCL 5 MG PO TABS
5.0000 mg | ORAL_TABLET | ORAL | Status: DC | PRN
  Administered 2023-08-12 – 2023-08-14 (×3): 5 mg via ORAL
  Filled 2023-08-12 (×3): qty 1

## 2023-08-12 MED ORDER — TAMSULOSIN HCL 0.4 MG PO CAPS
0.4000 mg | ORAL_CAPSULE | Freq: Every day | ORAL | Status: DC
  Administered 2023-08-12 – 2023-08-13 (×2): 0.4 mg via ORAL
  Filled 2023-08-12 (×2): qty 1

## 2023-08-12 MED ORDER — VANCOMYCIN HCL 2000 MG/400ML IV SOLN
2000.0000 mg | Freq: Once | INTRAVENOUS | Status: AC
  Administered 2023-08-12: 2000 mg via INTRAVENOUS
  Filled 2023-08-12: qty 400

## 2023-08-12 MED ORDER — PREGABALIN 100 MG PO CAPS
100.0000 mg | ORAL_CAPSULE | Freq: Two times a day (BID) | ORAL | Status: DC
  Administered 2023-08-12 – 2023-08-14 (×4): 100 mg via ORAL
  Filled 2023-08-12 (×4): qty 1

## 2023-08-12 MED ORDER — PREDNISONE 20 MG PO TABS
20.0000 mg | ORAL_TABLET | Freq: Every morning | ORAL | Status: DC
  Administered 2023-08-14: 20 mg via ORAL
  Filled 2023-08-12 (×2): qty 1

## 2023-08-12 MED ORDER — ATORVASTATIN CALCIUM 10 MG PO TABS
20.0000 mg | ORAL_TABLET | Freq: Every day | ORAL | Status: DC
  Administered 2023-08-12 – 2023-08-14 (×3): 20 mg via ORAL
  Filled 2023-08-12 (×3): qty 2

## 2023-08-12 MED ORDER — METOPROLOL SUCCINATE ER 50 MG PO TB24
50.0000 mg | ORAL_TABLET | Freq: Every day | ORAL | Status: DC
  Administered 2023-08-12 – 2023-08-14 (×3): 50 mg via ORAL
  Filled 2023-08-12: qty 2
  Filled 2023-08-12 (×2): qty 1

## 2023-08-12 MED ORDER — ALBUTEROL SULFATE (2.5 MG/3ML) 0.083% IN NEBU: 2.5000 mg | INHALATION_SOLUTION | Freq: Four times a day (QID) | RESPIRATORY_TRACT | Status: DC | PRN

## 2023-08-12 MED ORDER — PANTOPRAZOLE SODIUM 40 MG PO TBEC
40.0000 mg | DELAYED_RELEASE_TABLET | Freq: Every day | ORAL | Status: DC
  Administered 2023-08-13 – 2023-08-14 (×2): 40 mg via ORAL
  Filled 2023-08-12 (×2): qty 1

## 2023-08-12 MED ORDER — VANCOMYCIN HCL 1250 MG/250ML IV SOLN
1250.0000 mg | Freq: Two times a day (BID) | INTRAVENOUS | Status: DC
  Administered 2023-08-13: 1250 mg via INTRAVENOUS
  Filled 2023-08-12 (×2): qty 250

## 2023-08-12 MED ORDER — ACETAMINOPHEN 325 MG PO TABS: 650.0000 mg | ORAL_TABLET | Freq: Four times a day (QID) | ORAL | Status: DC | PRN

## 2023-08-12 MED ORDER — ENOXAPARIN SODIUM 40 MG/0.4ML IJ SOSY
40.0000 mg | PREFILLED_SYRINGE | INTRAMUSCULAR | Status: DC
  Administered 2023-08-13: 40 mg via SUBCUTANEOUS
  Filled 2023-08-12: qty 0.4

## 2023-08-12 MED ORDER — BISACODYL 5 MG PO TBEC: 5.0000 mg | DELAYED_RELEASE_TABLET | Freq: Every day | ORAL | Status: DC | PRN

## 2023-08-12 MED ORDER — ACETAMINOPHEN 650 MG RE SUPP: 650.0000 mg | Freq: Four times a day (QID) | RECTAL | Status: DC | PRN

## 2023-08-12 NOTE — Progress Notes (Signed)
 ED Pharmacy Antibiotic Sign Off An antibiotic consult was received from an ED provider for vancomycin per pharmacy dosing for necrosis of L second toe bone. A chart review was completed to assess appropriateness.   The following one time order(s) were placed:  Vancomycin 2000 mg IV  Further antibiotic and/or antibiotic pharmacy consults should be ordered by the admitting provider if indicated.   Thank you for allowing pharmacy to be a part of this patient's care.   Lennie Muckle, PharmD PGY1 Pharmacy Resident 08/12/2023 2:34 PM

## 2023-08-12 NOTE — Progress Notes (Signed)
 Case request ordered for procedure planned for tmrw

## 2023-08-12 NOTE — H&P (Signed)
 [facility] History and Physical  BRYLAN DEC ZOX:[id number] DOB: 05/11/1946 DOA: 08/12/2023 PCP: Venita Sheffield, MD  Presented from: Home Chief Complaint: Left second toe infection  History of Present Illness: Levi [name] is a 78 y.o. male with PMH significant for HTN, HLD, A-fib on Eliquis, CHF, COPD, BPH, GERD, neuropathy. Patient was sent to the ED by podiatry today with a plan of left foot surgery tomorrow. Patient has been following up with podiatry as an outpatient for left second toe wound.  On a course of Bactrim and azithromycin.  Last seen by Dr. Ralene Cork on 3/13, noted to have purulence and had concern for potential osteomyelitis.  Wound culture was taken.  Was scheduled for outpatient left second toe amputation at the surgery center today 3/18.  Off anticoagulation in anticipation of surgery. Per report, surgery could not be done today in absence of wound culture result.  Podiatrist directed the patient to ED for inpatient hospitalization and procedure tomorrow.  In the ED, patient was afebrile, hemodynamically stable. Labs with WBC count normal at 9.1, hemoglobin 12.4, potassium 3.3. ED physician discussed with podiatrist Dr. Annamary Rummage. Confirmed the plan of procedure tomorrow.  N.p.o. after midnight. IV vancomycin was recommended Hospitalist service was consulted for inpatient management  At the time of my evaluation, patient was on ED hallway stretcher. Not in distress Wife at bedside History reviewed and detailed as above Patient states he had exposure to agent orange and got lung damage.  He is chronically on prednisone for that.  Does not use supplemental oxygen.  Review of Systems:  All systems were reviewed and were negative unless otherwise mentioned in the HPI   Past medical history: Past Medical History:  Diagnosis Date   Agent orange exposure 1970's   takes Imdur,Diltiazem, and Atacand daily   Agent orange exposure    ALLERGIC RHINITIS     Allergy    seasonal allergies   Anemia    iron deficiency   Asthma    uses inhaler and nebulizers   Atrial fibrillation (HCC)    Bruises easily    d/t prednisone daily   Cataract    Cellulitis 03/22/2020   Chest wall pain 09/21/2019   L scapula, reported 4/27 21/     CHF (congestive heart failure) (HCC)    dx-on meds to treat   Chronic bronchitis (HCC)    "used to get it q yr; last time ?2008" (04/28/2012)   Clotting disorder (HCC) 2019   On blood thinner when I bleed it is for a consideral amount of time. This is tegardless of size of wound.   COPD (chronic obstructive pulmonary disease) (HCC)    agent orange exposure   Degenerative disk disease    "everywhere" (04/28/2012)   Diverticulitis    Dysrhythmia    afib   Elevated uric acid in blood    takes Allopurinol daily   Emphysema of lung (HCC)    Enlarged prostate    but not on any meds   GERD (gastroesophageal reflux disease)    takes Protonix daily   H/O hiatal hernia    Headache    History of colonoscopy    History of pneumonia    a. 2010   Hyperlipidemia    takes Lipitor daily; pt. states he takes a preventive   Hypothyroidism    Joint pain    Joint swelling    Leukocytosis    MSSA bacteremia 03/19/2023   Nausea and vomiting 11/12/2022   Neuromuscular disorder (HCC)  bilat legs, and bilat arms   Neuropathy    Osteoporosis 2012   PAF (paroxysmal atrial fibrillation) (HCC)    a. Dx 04/2012, CHA2DS2VASc = 1 (age);  b. 04/2012 Echo: EF 40-50%, mild MR.   Personal history of colonic adenomas 02/09/2013   Pneumonia    Sepsis (HCC) 03/18/2023   Shortness of breath dyspnea    Thyroid disease    no taking meds-caused dizziness    Past surgical history: Past Surgical History:  Procedure Laterality Date   26 HOUR PH STUDY N/A 05/30/2021   Procedure: 24 HOUR PH STUDY;  Surgeon: Shellia Cleverly, DO;  Location: [facility];  Service: Gastroenterology;  Laterality: N/A;   ANTERIOR CERVICAL DECOMP/DISCECTOMY  FUSION N/A 07/21/2012   Procedure: ANTERIOR CERVICAL DECOMPRESSION/DISCECTOMY FUSION 2 LEVELS;  Surgeon: Barnett Abu, MD;  Location: [facility];  Service: Neurosurgery;  Laterality: N/A;  C4-5 C5-6 Anterior cervical decompression/diskectomy/fusion   ANTERIOR LAT LUMBAR FUSION N/A 04/07/2017   Procedure: Thoracic twelve-Lumbar one Anterolateral decompression/fusion;  Surgeon: Barnett Abu, MD;  Location: [facility];  Service: Neurosurgery;  Laterality: N/A;   ARTHRODESIS FOOT WITH WEIL OSTEOTOMY Left 11/07/2022   Procedure: LEFT THIRD METATARSOPHALANGEAL CAPSULOTOMY, WEIL OSTEOTOMY;  Surgeon: Terance Hart, MD;  Location: [facility] OR;  Service: Orthopedics;  Laterality: Left;  LENGTH OF SURGERY: 60 MINUTES   BACK SURGERY     x 3   CARDIAC CATHETERIZATION  11/2008   Dr. Anne Fu - 20% calcified non flow limiting left main, 50% EF apical hypokinesis   CARPAL TUNNEL RELEASE Bilateral    CERVICAL DISC ARTHROPLASTY N/A 04/07/2017   Procedure: Cervical six-seven Disc arthroplasty;  Surgeon: Barnett Abu, MD;  Location: [facility];  Service: Neurosurgery;  Laterality: N/A;   CHEST TUBE INSERTION Left 04/11/2017   Procedure: CHEST TUBE INSERTION;  Surgeon: Kerin Perna, MD;  Location: [facility] OR;  Service: Thoracic;  Laterality: Left;   CHOLECYSTECTOMY     COLONOSCOPY  2016   CG-MAC-miralax(good)servere TICS/TA x 2   ESOPHAGEAL MANOMETRY N/A 05/30/2021   Procedure: ESOPHAGEAL MANOMETRY (EM);  Surgeon: Shellia Cleverly, DO;  Location: [facility];  Service: Gastroenterology;  Laterality: N/A;  ph impedence   ESOPHAGOGASTRODUODENOSCOPY     EYE SURGERY Bilateral 2011   steroidal encapsulation" (04/28/2012)   FOOT SURGERY Right    x 2   JOINT REPLACEMENT     Many surgeries including 5 joit replacements   KNEE ARTHROSCOPY Bilateral    LATERAL / POSTERIOR COMBINED FUSION LUMBAR SPINE  2012   LEFT ATRIAL APPENDAGE OCCLUSION N/A 02/22/2021   Procedure: LEFT ATRIAL APPENDAGE OCCLUSION;  Surgeon: Lanier Prude, MD;  [facility];  Service: Cardiovascular;  Laterality: N/A;   NEUROPLASTY / TRANSPOSITION ULNAR NERVE AT ELBOW Bilateral    POLYPECTOMY  2016   severe TICS/TA x 2   POSTERIOR CERVICAL LAMINECTOMY WITH MET- RX Right 06/11/2021   Procedure: Right Cervical six-seven  Laminectomy and foraminotomy with metrex;  Surgeon: Barnett Abu, MD;  Location: [facility];  Service: Neurosurgery;  Laterality: Right;   REFRACTIVE SURGERY Left 07/07/2023   laser surgery but still has "floaters"   REVERSE SHOULDER ARTHROPLASTY  04/28/2012   Procedure: REVERSE SHOULDER ARTHROPLASTY;  Surgeon: Mable Paris, MD;  Location: [facility] OR;  Service: Orthopedics;  Laterality: Left;  Left shouder reverse total shoulder arthroplasty   SHOULDER SURGERY     SPINE SURGERY     I have had 7 spinal surgeries   TEE WITHOUT CARDIOVERSION N/A 02/22/2021  Procedure: TRANSESOPHAGEAL ECHOCARDIOGRAM (TEE);  Surgeon: Lanier Prude, MD;  [facility] INVASIVE CV LAB;  Service: Cardiovascular;  Laterality: N/A;   TENDON REPAIR Right 08/07/2020   Procedure: RIGHT HAND LIGAMENT RECONSTRUCTION AND TENDON INTERPOSITION;  Surgeon: Jodi Geralds, MD;  Location: [facility];  Service: Orthopedics;  Laterality: Right;   TONSILLECTOMY AND ADENOIDECTOMY  1954   TOTAL HIP ARTHROPLASTY Left 2011   "left" (04/28/2012)   TOTAL HIP ARTHROPLASTY Right 07/31/2015   Procedure: TOTAL HIP ARTHROPLASTY ANTERIOR APPROACH;  Surgeon: Jodi Geralds, MD;  Location: [facility];  Service: Orthopedics;  Laterality: Right;   TOTAL KNEE ARTHROPLASTY  01/10/2012   Procedure: TOTAL KNEE ARTHROPLASTY;  Surgeon: Harvie Junior, MD;  Location: [facility];  Service: Orthopedics;;  left total knee arthroplasty   TOTAL KNEE ARTHROPLASTY Right 06/17/2014   Procedure: TOTAL KNEE ARTHROPLASTY;  Surgeon: Harvie Junior, MD;  Location: [facility];  Service: Orthopedics;  Laterality: Right;   toupet fundolplication  11/01/2021   TRANSESOPHAGEAL  ECHOCARDIOGRAM (CATH LAB) N/A 03/24/2023   Procedure: TRANSESOPHAGEAL ECHOCARDIOGRAM;  Surgeon: Chrystie Nose, MD;  [facility]: [facility];  Service: Cardiovascular;  Laterality: N/A;    Social History:  reports that he has never smoked. He has never used smokeless tobacco. He reports that he does not drink alcohol and does not use drugs.  Allergies:  Allergies  Allergen Reactions   Levaquin [Levofloxacin] Itching    Tolerated ciprofloxacin in 2022.   Levaquin [levofloxacin]   Family history:  Family History  Problem Relation Age of Onset   Diabetes Mother    Heart disease Father        Died 10, MI   Cancer Father    Hyperlipidemia Father    Stroke Father    Prostate cancer Brother    Colon polyps Brother    Colon polyps Brother    Asthma Brother    COPD Brother    Colon cancer Brother        Dx age 80   Cancer Brother    Colon cancer Other    Esophageal cancer Neg Hx    Rectal cancer Neg Hx    Stomach cancer Neg Hx    Pancreatic cancer Neg Hx    Kidney disease Neg Hx    Liver disease Neg Hx    Neuropathy Neg Hx      Physical Exam: Vitals:   08/12/23 1251 08/12/23 1321 08/12/23 1605  BP: (!) 145/108  (!) 134/99  Pulse: (!) 56  85  Resp: 18  16  Temp: 97.6 F (36.4 C)  98.2 F (36.8 C)  SpO2: 96%  100%  Weight:  92.5 kg   Height:  6\' 2"  (1.88 m)    Wt Readings from Last 3 Encounters:  08/12/23 92.5 kg  07/31/23 92.5 kg  06/27/23 88 kg   Body mass index is 26.18 kg/m.  General exam: Pleasant, elderly Caucasian male.  Not in distress Skin: No rashes, lesions or ulcers. HEENT: Atraumatic, normocephalic, no obvious bleeding Lungs: Clear to auscultation bilaterally,  CVS: S1, S2, no murmur,   GI/Abd: Soft, nontender, nondistended, bowel sound present,   CNS: Alert, awake, oriented x 3 Psychiatry: Mood appropriate Extremities: No pedal edema, no calf tenderness, foot exam per  podiatry.   ----------------------------------------------------------------------------------------------------------------------------------------- ----------------------------------------------------------------------------------------------------------------------------------------- -----------------------------------------------------------------------------------------------------------------------------------------  Assessment/Plan: Principal Problem:   Osteomyelitis (HCC)  Acute osteomyelitis and cellulitis of left toe Did not improve with outpatient course of Bactrim and azithromycin Podiatry plans for surgery intervention tomorrow Started  on IV vancomycin N.p.o. after midnight Pain meds PRN  Right foot pain Also complains of pain on the right foot and wondering if x-rays required.  Defer to podiatry  A-fib Chronically anticoagulated with Eliquis.  On hold for last few days in anticipation of surgery.  Plan to resume after surgery  CHF Hypertension PTA meds- metoprolol, Imdur, Lasix as needed  Hyperlipidemia Lipitor  COPD Patient states he had exposure to agent orange and got lung damage.  He is chronically on prednisone for that.  Does not use supplemental oxygen.  BPH Flomax  Neuropathy Lyrica, Cymbalta  Chronic anemia GERD Continue PPI and iron supplement Recent Labs    04/22/23 0358 05/02/23 0931 05/27/23 1435 06/17/23 1129 07/31/23 0840 08/12/23 1348  HGB 10.7* 11.3* 10.2*  --  11.7* 12.4*  MCV 93.2 93 92.2  --  93.9 95.4  VITAMINB12  --   --   --  202  --   --   FERRITIN  --   --   --  41  --   --   TIBC  --   --   --  381  --   --   IRON  --   --   --  34*  --   --     Mobility: Uses a cane  Goals of care:   Code Status: Full Code    DVT prophylaxis:  enoxaparin (LOVENOX) injection 40 mg Start: 08/13/23 2000   Antimicrobials: IV vancomycin Fluid: None Consultants: Podiatry Family Communication: Wife at bedside  Status:  Observation Level of care:  Telemetry Medical   Patient is from: Home Anticipated d/c to: Pending clinical course  Diet: Diet Order             Diet Heart Room service appropriate? Yes; Fluid consistency: Thin  Diet effective now                    ------------------------------------------------------------------------------------- Severity of Illness: The appropriate patient status for this patient is OBSERVATION. Observation status is judged to be reasonable and necessary in order to provide the required intensity of service to ensure the patient's safety. The patient's presenting symptoms, physical exam findings, and initial radiographic and laboratory data in the context of their medical condition is felt to place them at decreased risk for further clinical deterioration. Furthermore, it is anticipated that the patient will be medically stable for discharge from the hospital within 2 midnights of admission.  -------------------------------------------------------------------------------------   Home Meds: Prior to Admission medications   Medication Sig Start Date End Date Taking? Authorizing Provider  albuterol (VENTOLIN HFA) 108 (90 Base) MCG/ACT inhaler INHALE 2 PUFFS BY MOUTH EVERY 4 HOURS AS NEEDED FOR WHEEZE OR FOR SHORTNESS OF BREATH 01/14/23   Jetty Duhamel D, MD  allopurinol (ZYLOPRIM) 100 MG tablet Take 100 mg by mouth in the morning.    [provider]  atorvastatin (LIPITOR) 20 MG tablet TAKE 1 TABLET BY MOUTH EVERY DAY 06/06/23   Jake Bathe, MD  candesartan (ATACAND) 4 MG tablet Take 2 mg by mouth daily.    [provider]  cyclobenzaprine (FLEXERIL) 10 MG tablet Take 10 mg by mouth 3 (three) times daily as needed for muscle spasms.    [provider]  DULoxetine (CYMBALTA) 30 MG capsule Take 1 capsule (30 mg total) by mouth daily. 06/17/23   Venita Sheffield, MD  ELIQUIS 5 MG TABS tablet TAKE 1 TABLET BY MOUTH TWICE A DAY 07/28/23  Jake Bathe, MD  ferrous gluconate (FERGON) 324 MG tablet Take 1 tablet (324 mg total) by mouth daily. 06/18/23   Venita Sheffield, MD  furosemide (LASIX) 40 MG tablet Take 1 tablet (40 mg total) by mouth daily. 05/27/23   Venita Sheffield, MD  ipratropium-albuterol (DUONEB) 0.5-2.5 (3) MG/3ML SOLN 1 NEBULE EVERY 6 HOURS AS NEEDED. **J45.3** 03/22/20   Jetty Duhamel D, MD  isosorbide mononitrate (IMDUR) 30 MG 24 hr tablet TAKE 1 TABLET BY MOUTH EVERY DAY 03/10/23   Jake Bathe, MD  meclizine (ANTIVERT) 25 MG tablet TAKE 1 TABLET BY MOUTH 3 TIMES DAILY AS NEEDED FOR DIZZINESS. 07/03/23   Monica Becton, MD  metoprolol succinate (TOPROL-XL) 50 MG 24 hr tablet Take 1 tablet (50 mg total) by mouth daily. Take with or immediately following a meal. 06/20/23   Venita Sheffield, MD  Nebulizers (COMPRESSOR/NEBULIZER) MISC Use as directed 12/01/17   Waymon Budge, MD  Olodaterol HCl (STRIVERDI RESPIMAT) 2.5 MCG/ACT AERS INHALE 2 PUFFS BY MOUTH INTO THE LUNGS DAILY 03/10/23   Waymon Budge, MD  ondansetron (ZOFRAN) 4 MG tablet Take 1 tablet (4 mg total) by mouth every 8 (eight) hours as needed for nausea or vomiting. 08/12/23   Louann Sjogren, DPM  ondansetron (ZOFRAN-ODT) 8 MG disintegrating tablet Take 1 tablet (8 mg total) by mouth every 8 (eight) hours as needed for nausea. 04/07/23   Monica Becton, MD  oxyCODONE-acetaminophen (PERCOCET/ROXICET) 5-325 MG tablet Take 1 tablet by mouth every 4 (four) hours as needed for up to 5 days for severe pain (pain score 7-10). 08/12/23 08/17/23  Louann Sjogren, DPM  pantoprazole (PROTONIX) 40 MG tablet TAKE 1 TABLET (40 MG TOTAL) BY MOUTH TWICE A DAY BEFORE MEALS 11/04/22   Monica Becton, MD  predniSONE (DELTASONE) 10 MG tablet Take 20 mg by mouth in the morning. 07/05/23   [provider]  pregabalin (LYRICA) 100 MG capsule Take 1 capsule (100 mg total) by mouth 2 (two) times daily. 06/17/23   Venita Sheffield, MD   sulfamethoxazole-trimethoprim (BACTRIM) 400-80 MG tablet Take 1 tablet by mouth daily starting 05/02/2023 05/02/23   Danelle Earthly, MD  tamsulosin (FLOMAX) 0.4 MG CAPS capsule Take 0.4 mg by mouth at bedtime.    [provider]    Labs on Admission:   CBC: Recent Labs  Lab 08/12/23 1348  WBC 9.1  NEUTROABS 6.4  HGB 12.4*  HCT 39.3  MCV 95.4  PLT 180    Basic Metabolic Panel: Recent Labs  Lab 08/12/23 1348  NA 139  K 3.3*  CL 110  CO2 25  GLUCOSE 91  BUN 23  CREATININE 0.91  CALCIUM 9.1    Liver Function Tests: Recent Labs  Lab 08/12/23 1348  AST 20  ALT 19  ALKPHOS 65  BILITOT 0.8  PROT 6.0*  ALBUMIN 3.6   No results for input(s): "LIPASE", "AMYLASE" in the last 168 hours. No results for input(s): "AMMONIA" in the last 168 hours.  Cardiac Enzymes: No results for input(s): "CKTOTAL", "CKMB", "CKMBINDEX", "TROPONINI" in the last 168 hours.  BNP (last 3 results) No results for input(s): "BNP" in the last 8760 hours.  ProBNP (last 3 results) No results for input(s): "PROBNP" in the last 8760 hours.  CBG: No results for input(s): "GLUCAP" in the last 168 hours.  Lipase     Component Value Date/Time   LIPASE 10 11/12/2022 0920     Urinalysis    Component Value Date/Time   COLORURINE  YELLOW 04/17/2023 1228   APPEARANCEUR CLEAR 04/17/2023 1228   APPEARANCEUR Clear 03/18/2023 1411   LABSPEC 1.013 04/17/2023 1228   PHURINE 6.0 04/17/2023 1228   GLUCOSEU NEGATIVE 04/17/2023 1228   HGBUR SMALL (A) 04/17/2023 1228   BILIRUBINUR NEGATIVE 04/17/2023 1228   BILIRUBINUR Negative 03/18/2023 1411   KETONESUR NEGATIVE 04/17/2023 1228   PROTEINUR NEGATIVE 04/17/2023 1228   UROBILINOGEN 0.2 05/02/2022 0916   UROBILINOGEN 0.2 04/21/2012 0936   NITRITE NEGATIVE 04/17/2023 1228   LEUKOCYTESUR TRACE (A) 04/17/2023 1228     Drugs of Abuse  No results found for: "LABOPIA", "COCAINSCRNUR", "LABBENZ", "AMPHETMU", "THCU", "LABBARB"    Radiological  Exams on Admission: No results found.   Signed, Lorin Glass, MD [facility] 08/12/2023

## 2023-08-12 NOTE — ED Triage Notes (Signed)
 Pt's podiatrist told pt an hour before surgery at [facility] outpatient surgical ctr that he needed to come to [facility] ED to get surgery, because they haven't got the results of the culture of the toe.

## 2023-08-12 NOTE — Progress Notes (Signed)
 Pharmacy Antibiotic Note  Levi [name] is a 78 y.o. male admitted on 08/12/2023 with  osteomyelitis .  Pharmacy has been consulted for vancomycin dosing.  Patient presented with increased foot pain and concern for wound on left food. He has an ulcerative wound on his L second toe that has been present for several weeks and was referred for surgical intervention. WBC 9.1, patient afebrile, Scr 0.91.  Plan: Vancomycin 2000 mg IV x 1 dose followed by  1250 mg IV Q12h (eAUC 528, IBW, Vd 0.72, Scr 0.91) Monitor renal function, cultures, and vancomycin levels as necessary   Height: 6\' 2"  (188 cm) Weight: 92.5 kg (203 lb 14.8 oz) IBW/kg (Calculated) : 82.2  Temp (24hrs), Avg:97.9 F (36.6 C), Min:97.6 F (36.4 C), Max:98.2 F (36.8 C)  Recent Labs  Lab 08/12/23 1348  WBC 9.1  CREATININE 0.91    Estimated Creatinine Clearance: 80.3 mL/min (by C-G formula based on SCr of 0.91 mg/dL).    Allergies  Allergen Reactions   Levaquin [Levofloxacin] Itching    Tolerated ciprofloxacin in 2022.    Antimicrobials this admission: Vancomycin 3/18 >>  Dose adjustments this admission: N/A  Microbiology results: 3/18 BCx: pending 3/18 MRSA PCR: pending  Thank you for allowing pharmacy to be a part of this patient's care.  Lennie Muckle, PharmD PGY1 Pharmacy Resident 08/12/2023 6:08 PM

## 2023-08-12 NOTE — ED Provider Triage Note (Signed)
 Emergency Medicine Provider Triage Evaluation Note  TOREZ BEAUREGARD , a 78 y.o. male  was evaluated in triage.  Pt complains of sent by podiatry to be admitted to hospitalist service for toe amputation tomorrow per Dr. Annamary Rummage.  Review of Systems  Positive: Toe pain Negative: Fever, chills  Physical Exam  BP (!) 145/108 (BP Location: Right Arm)   Pulse (!) 56   Temp 97.6 F (36.4 C)   Resp 18   Ht 6\' 2"  (1.88 m)   Wt 92.5 kg   SpO2 96%   BMI 26.18 kg/m  Gen:   Awake, no distress   Resp:  Normal effort  MSK:   Moves extremities without difficulty  Other:    Medical Decision Making  Medically screening exam initiated at 1:32 PM.  Appropriate orders placed.  Sindy Guadeloupe was informed that the remainder of the evaluation will be completed by another provider, this initial triage assessment does not replace that evaluation, and the importance of remaining in the ED until their evaluation is complete.  CBC, CMP, vancomycin per Dr. Annamary Rummage Pt NPO at midnight   Dolphus Jenny, [place] 08/12/23 1335

## 2023-08-12 NOTE — ED Provider Notes (Signed)
 [place] EMERGENCY DEPARTMENT AT [facility] Provider Note   CSN: [id number] Arrival date & time: 08/12/23  1235     History  No chief complaint on file.   Levi [name] is a 78 y.o. male.  HPI   78 year old male presents emergency department with complaints of wound on left foot.  Patient with ulcerative wound on left second toe has been present for the past several weeks.  Has been followed with podiatry in the outpatient setting and recently was on Bactrim as well as azithromycin.  Was sent to the emergency department by podiatry given concerns for osteomyelitis for more emergent surgical intervention.  Patient denies any fevers, chills, extending redness/pain/swelling.  Past medical history significant for CHF, COPD, clotting disorder, TFT's disease, emphysema, paroxysmal atrial fibrillation on Eliquis, hypothyroidism, hyperlipidemia, cervical radiculopathy  Home Medications Prior to Admission medications   Medication Sig Start Date End Date Taking? Authorizing Provider  albuterol (VENTOLIN HFA) 108 (90 Base) MCG/ACT inhaler INHALE 2 PUFFS BY MOUTH EVERY 4 HOURS AS NEEDED FOR WHEEZE OR FOR SHORTNESS OF BREATH 01/14/23   Jetty Duhamel D, MD  allopurinol (ZYLOPRIM) 100 MG tablet Take 100 mg by mouth in the morning.    [provider]  atorvastatin (LIPITOR) 20 MG tablet TAKE 1 TABLET BY MOUTH EVERY DAY 06/06/23   Jake Bathe, MD  candesartan (ATACAND) 4 MG tablet Take 2 mg by mouth daily.    [provider]  cyclobenzaprine (FLEXERIL) 10 MG tablet Take 10 mg by mouth 3 (three) times daily as needed for muscle spasms.    [provider]  DULoxetine (CYMBALTA) 30 MG capsule Take 1 capsule (30 mg total) by mouth daily. 06/17/23   Venita Sheffield, MD  ELIQUIS 5 MG TABS tablet TAKE 1 TABLET BY MOUTH TWICE A DAY 07/28/23   Jake Bathe, MD  ferrous gluconate (FERGON) 324 MG tablet Take 1 tablet (324 mg total) by mouth daily. 06/18/23    Venita Sheffield, MD  furosemide (LASIX) 40 MG tablet Take 1 tablet (40 mg total) by mouth daily. 05/27/23   Venita Sheffield, MD  ipratropium-albuterol (DUONEB) 0.5-2.5 (3) MG/3ML SOLN 1 NEBULE EVERY 6 HOURS AS NEEDED. **J45.3** 03/22/20   Jetty Duhamel D, MD  isosorbide mononitrate (IMDUR) 30 MG 24 hr tablet TAKE 1 TABLET BY MOUTH EVERY DAY 03/10/23   Jake Bathe, MD  meclizine (ANTIVERT) 25 MG tablet TAKE 1 TABLET BY MOUTH 3 TIMES DAILY AS NEEDED FOR DIZZINESS. 07/03/23   Monica Becton, MD  metoprolol succinate (TOPROL-XL) 50 MG 24 hr tablet Take 1 tablet (50 mg total) by mouth daily. Take with or immediately following a meal. 06/20/23   Venita Sheffield, MD  Nebulizers (COMPRESSOR/NEBULIZER) MISC Use as directed 12/01/17   Waymon Budge, MD  Olodaterol HCl (STRIVERDI RESPIMAT) 2.5 MCG/ACT AERS INHALE 2 PUFFS BY MOUTH INTO THE LUNGS DAILY 03/10/23   Waymon Budge, MD  ondansetron (ZOFRAN) 4 MG tablet Take 1 tablet (4 mg total) by mouth every 8 (eight) hours as needed for nausea or vomiting. 08/12/23   Louann Sjogren, DPM  ondansetron (ZOFRAN-ODT) 8 MG disintegrating tablet Take 1 tablet (8 mg total) by mouth every 8 (eight) hours as needed for nausea. 04/07/23   Monica Becton, MD  oxyCODONE-acetaminophen (PERCOCET/ROXICET) 5-325 MG tablet Take 1 tablet by mouth every 4 (four) hours as needed for up to 5 days for severe pain (pain score 7-10). 08/12/23 08/17/23  Louann Sjogren, DPM  pantoprazole (PROTONIX) 40  MG tablet TAKE 1 TABLET (40 MG TOTAL) BY MOUTH TWICE A DAY BEFORE MEALS 11/04/22   Monica Becton, MD  predniSONE (DELTASONE) 10 MG tablet Take 20 mg by mouth in the morning. 07/05/23   [provider]  pregabalin (LYRICA) 100 MG capsule Take 1 capsule (100 mg total) by mouth 2 (two) times daily. 06/17/23   Venita Sheffield, MD  sulfamethoxazole-trimethoprim (BACTRIM) 400-80 MG tablet Take 1 tablet by mouth daily starting 05/02/2023 05/02/23    Danelle Earthly, MD  tamsulosin (FLOMAX) 0.4 MG CAPS capsule Take 0.4 mg by mouth at bedtime.    [provider]      Allergies    Levaquin [levofloxacin]    Review of Systems   Review of Systems  All other systems reviewed and are negative.   Physical Exam Updated Vital Signs BP (!) 134/99 (BP Location: Left Arm)   Pulse 85   Temp 98.2 F (36.8 C)   Resp 16   Ht 6\' 2"  (1.88 m)   Wt 92.5 kg   SpO2 100%   BMI 26.18 kg/m  Physical Exam Vitals and nursing note reviewed.  Constitutional:      General: He is not in acute distress.    Appearance: He is well-developed.  HENT:     Head: Normocephalic and atraumatic.  Eyes:     Conjunctiva/sclera: Conjunctivae normal.  Cardiovascular:     Rate and Rhythm: Normal rate and regular rhythm.  Pulmonary:     Effort: Pulmonary effort is normal. No respiratory distress.     Breath sounds: Normal breath sounds.  Abdominal:     Palpations: Abdomen is soft.     Tenderness: There is no abdominal tenderness.  Musculoskeletal:     Cervical back: Neck supple.     Comments: 1-2+ left lower extremity edema.  Ulcerative lesion on the dorsal aspect of second toe of the left foot.  Overlying tenderness.  Mild purulent drainage appreciated.  Ulcerative lesion appreciated on the dorsal lateral aspect of right fifth digit of foot.  No extending erythema.  Overlying tenderness of said area.  Pedal pulses 2+ bilaterally.  Skin:    General: Skin is warm and dry.     Capillary Refill: Capillary refill takes less than 2 seconds.  Neurological:     Mental Status: He is alert.  Psychiatric:        Mood and Affect: Mood normal.     ED Results / Procedures / Treatments   Labs (all labs ordered are listed, but only abnormal results are displayed) Labs Reviewed  COMPREHENSIVE METABOLIC PANEL - Abnormal; Notable for the following components:      Result Value   Potassium 3.3 (*)    Total Protein 6.0 (*)    Anion gap 4 (*)    All other  components within normal limits  CBC WITH DIFFERENTIAL/PLATELET - Abnormal; Notable for the following components:   RBC 4.12 (*)    Hemoglobin 12.4 (*)    RDW 20.2 (*)    Abs Immature Granulocytes 0.40 (*)    All other components within normal limits  CULTURE, BLOOD (ROUTINE X 2)  CULTURE, BLOOD (ROUTINE X 2)  I-STAT CG4 LACTIC ACID, ED    EKG None  Radiology No results found.  Procedures Procedures    Medications Ordered in ED Medications  vancomycin (VANCOREADY) IVPB 2000 mg/400 mL (has no administration in time range)    ED Course/ Medical Decision Making/ A&P Clinical Course as of 08/12/23 1757  Tue  Aug 12, 2023  1757 Consulted Dr. Pola Corn who agreed with admission and assume further treatment/care. [CR]    Clinical Course User Index [CR] Peter Garter, PA                                 Medical Decision Making  This patient presents to the ED for concern of foot pain, this involves an extensive number of treatment options, and is a complaint that carries with it a high risk of complications and morbidity.  The differential diagnosis includes fracture, strain/pain, dislocation, ligament/tendinous injury, neurovascular compromise, osteomyelitis, cellulitis, sepsis, negative infection, ischemic limb, DVT   Co morbidities that complicate the patient evaluation  See HPI   Additional history obtained:  Additional history obtained from EMR External records from outside source obtained and reviewed including hospital records   Lab Tests:  I Ordered, and personally interpreted labs.  The pertinent results include: No leukocytosis.  Anemia hemoglobin of 12.4.  Platelets within range.  Mild hypokalemia 3.3 otherwise, S within the limits.  No transaminitis.  No renal dysfunction.  Blood cultures and lactic acid pending   Imaging Studies ordered:  N/a   Cardiac Monitoring: / EKG:  The patient was maintained on a cardiac monitor.  I personally viewed and  interpreted the cardiac monitored which showed an underlying rhythm of: Sinus rhythm   Consultations Obtained:  See ED course   Problem List / ED Course / Critical interventions / Medication management  Left second toe pain I ordered medication including vancomycin   Reevaluation of the patient after these medicines showed that the patient stayed the same I have reviewed the patients home medicines and have made adjustments as needed   Social Determinants of Health:  Denies tobacco, licit drug use   Test / Admission - Considered:  Left second toe pain Vitals signs within normal range and stable throughout visit. Laboratory/imaging studies significant for: See above 78 year old male presents emergency department with complaints of wound on left foot.  Patient with ulcerative wound on left second toe has been present for the past several weeks.  Has been followed with podiatry in the outpatient setting and recently was on Bactrim as well as azithromycin.  Was sent to the emergency department by podiatry given concerns for osteomyelitis for more emergent surgical intervention.  Patient denies any fevers, chills, extending redness/pain/swelling. On exam, ulcerative area appreciated to dorsal aspect of left second toe.  Similar-appearing ulcerative lesion appreciated on the dorsal lateral aspect of right fifth digit of foot.  Patient without meeting of SIRS criteria for sepsis protocol was not performed.  Began vancomycin at the request of podiatry given concern for osteomyelitis/necrosis of bone.  Will admit to hospitalist keeping the patient n.p.o. after midnight for planned surgical intervention tomorrow.  Treatment plan discussed at length with patient and he acknowledged understanding.  Patient stable upon admission.        Final Clinical Impression(s) / ED Diagnoses Final diagnoses:  Pain of toe of left foot    Rx / DC Orders ED Discharge Orders     None          Peter Garter, [place] 08/12/23 1752    Charlynne Pander, MD 08/12/23 2213

## 2023-08-13 ENCOUNTER — Observation Stay (HOSPITAL_COMMUNITY): Admitting: Certified Registered"

## 2023-08-13 ENCOUNTER — Inpatient Hospital Stay (HOSPITAL_COMMUNITY): Admission: RE | Admit: 2023-08-13 | Source: Home / Self Care | Admitting: Podiatry

## 2023-08-13 ENCOUNTER — Observation Stay (HOSPITAL_COMMUNITY)

## 2023-08-13 ENCOUNTER — Encounter (HOSPITAL_COMMUNITY)
Admission: EM | Disposition: A | Discharge status: Home / Self Care | Payer: Self-pay | Source: Home / Self Care | Attending: Internal Medicine

## 2023-08-13 ENCOUNTER — Other Ambulatory Visit: Payer: Self-pay | Admitting: Sports Medicine

## 2023-08-13 ENCOUNTER — Other Ambulatory Visit: Payer: Self-pay

## 2023-08-13 ENCOUNTER — Encounter (HOSPITAL_COMMUNITY): Payer: Self-pay | Admitting: Anesthesiology

## 2023-08-13 ENCOUNTER — Encounter (HOSPITAL_COMMUNITY): Payer: Self-pay | Admitting: Internal Medicine

## 2023-08-13 DIAGNOSIS — Z833 Family history of diabetes mellitus: Secondary | ICD-10-CM | POA: Diagnosis not present

## 2023-08-13 DIAGNOSIS — E785 Hyperlipidemia, unspecified: Secondary | ICD-10-CM | POA: Diagnosis present

## 2023-08-13 DIAGNOSIS — G629 Polyneuropathy, unspecified: Secondary | ICD-10-CM | POA: Diagnosis present

## 2023-08-13 DIAGNOSIS — M19071 Primary osteoarthritis, right ankle and foot: Secondary | ICD-10-CM | POA: Diagnosis not present

## 2023-08-13 DIAGNOSIS — Z8249 Family history of ischemic heart disease and other diseases of the circulatory system: Secondary | ICD-10-CM | POA: Diagnosis not present

## 2023-08-13 DIAGNOSIS — M86171 Other acute osteomyelitis, right ankle and foot: Secondary | ICD-10-CM | POA: Diagnosis present

## 2023-08-13 DIAGNOSIS — J189 Pneumonia, unspecified organism: Secondary | ICD-10-CM | POA: Diagnosis not present

## 2023-08-13 DIAGNOSIS — D649 Anemia, unspecified: Secondary | ICD-10-CM | POA: Diagnosis present

## 2023-08-13 DIAGNOSIS — E876 Hypokalemia: Secondary | ICD-10-CM | POA: Diagnosis present

## 2023-08-13 DIAGNOSIS — I110 Hypertensive heart disease with heart failure: Secondary | ICD-10-CM | POA: Diagnosis present

## 2023-08-13 DIAGNOSIS — M7989 Other specified soft tissue disorders: Secondary | ICD-10-CM | POA: Diagnosis not present

## 2023-08-13 DIAGNOSIS — M869 Osteomyelitis, unspecified: Secondary | ICD-10-CM | POA: Diagnosis present

## 2023-08-13 DIAGNOSIS — M86672 Other chronic osteomyelitis, left ankle and foot: Secondary | ICD-10-CM | POA: Diagnosis not present

## 2023-08-13 DIAGNOSIS — L97524 Non-pressure chronic ulcer of other part of left foot with necrosis of bone: Secondary | ICD-10-CM | POA: Insufficient documentation

## 2023-08-13 DIAGNOSIS — Z89422 Acquired absence of other left toe(s): Secondary | ICD-10-CM | POA: Diagnosis not present

## 2023-08-13 DIAGNOSIS — J4489 Other specified chronic obstructive pulmonary disease: Secondary | ICD-10-CM | POA: Diagnosis present

## 2023-08-13 DIAGNOSIS — E039 Hypothyroidism, unspecified: Secondary | ICD-10-CM | POA: Diagnosis present

## 2023-08-13 DIAGNOSIS — Z96653 Presence of artificial knee joint, bilateral: Secondary | ICD-10-CM | POA: Diagnosis present

## 2023-08-13 DIAGNOSIS — J449 Chronic obstructive pulmonary disease, unspecified: Secondary | ICD-10-CM | POA: Diagnosis not present

## 2023-08-13 DIAGNOSIS — J439 Emphysema, unspecified: Secondary | ICD-10-CM | POA: Diagnosis present

## 2023-08-13 DIAGNOSIS — I83025 Varicose veins of left lower extremity with ulcer other part of foot: Secondary | ICD-10-CM | POA: Diagnosis present

## 2023-08-13 DIAGNOSIS — I509 Heart failure, unspecified: Secondary | ICD-10-CM

## 2023-08-13 DIAGNOSIS — Z96643 Presence of artificial hip joint, bilateral: Secondary | ICD-10-CM | POA: Diagnosis present

## 2023-08-13 DIAGNOSIS — Z7901 Long term (current) use of anticoagulants: Secondary | ICD-10-CM | POA: Diagnosis not present

## 2023-08-13 DIAGNOSIS — L089 Local infection of the skin and subcutaneous tissue, unspecified: Secondary | ICD-10-CM | POA: Diagnosis not present

## 2023-08-13 DIAGNOSIS — M86671 Other chronic osteomyelitis, right ankle and foot: Secondary | ICD-10-CM | POA: Diagnosis not present

## 2023-08-13 DIAGNOSIS — L97519 Non-pressure chronic ulcer of other part of right foot with unspecified severity: Secondary | ICD-10-CM | POA: Diagnosis not present

## 2023-08-13 DIAGNOSIS — Z7952 Long term (current) use of systemic steroids: Secondary | ICD-10-CM | POA: Diagnosis not present

## 2023-08-13 DIAGNOSIS — K219 Gastro-esophageal reflux disease without esophagitis: Secondary | ICD-10-CM | POA: Diagnosis present

## 2023-08-13 DIAGNOSIS — I5022 Chronic systolic (congestive) heart failure: Secondary | ICD-10-CM | POA: Diagnosis present

## 2023-08-13 DIAGNOSIS — Z9889 Other specified postprocedural states: Secondary | ICD-10-CM | POA: Diagnosis not present

## 2023-08-13 DIAGNOSIS — Z981 Arthrodesis status: Secondary | ICD-10-CM | POA: Diagnosis not present

## 2023-08-13 DIAGNOSIS — Z89421 Acquired absence of other right toe(s): Secondary | ICD-10-CM | POA: Diagnosis not present

## 2023-08-13 DIAGNOSIS — I83015 Varicose veins of right lower extremity with ulcer other part of foot: Secondary | ICD-10-CM | POA: Diagnosis present

## 2023-08-13 DIAGNOSIS — M14679 Charcot's joint, unspecified ankle and foot: Secondary | ICD-10-CM | POA: Diagnosis not present

## 2023-08-13 DIAGNOSIS — L97529 Non-pressure chronic ulcer of other part of left foot with unspecified severity: Secondary | ICD-10-CM | POA: Diagnosis not present

## 2023-08-13 DIAGNOSIS — I480 Paroxysmal atrial fibrillation: Secondary | ICD-10-CM | POA: Diagnosis present

## 2023-08-13 DIAGNOSIS — N400 Enlarged prostate without lower urinary tract symptoms: Secondary | ICD-10-CM | POA: Diagnosis present

## 2023-08-13 DIAGNOSIS — Z881 Allergy status to other antibiotic agents status: Secondary | ICD-10-CM | POA: Diagnosis not present

## 2023-08-13 DIAGNOSIS — M86172 Other acute osteomyelitis, left ankle and foot: Secondary | ICD-10-CM | POA: Diagnosis present

## 2023-08-13 HISTORY — PX: AMPUTATION TOE: SHX6595

## 2023-08-13 LAB — CBC
HCT: 36.3 % — ABNORMAL LOW (ref 39.0–52.0)
Hemoglobin: 11.5 g/dL — ABNORMAL LOW (ref 13.0–17.0)
MCH: 30.1 pg (ref 26.0–34.0)
MCHC: 31.7 g/dL (ref 30.0–36.0)
MCV: 95 fL (ref 80.0–100.0)
Platelets: 150 10*3/uL (ref 150–400)
RBC: 3.82 MIL/uL — ABNORMAL LOW (ref 4.22–5.81)
RDW: 20.7 % — ABNORMAL HIGH (ref 11.5–15.5)
WBC: 6 10*3/uL (ref 4.0–10.5)
nRBC: 0 % (ref 0.0–0.2)

## 2023-08-13 LAB — BASIC METABOLIC PANEL
Anion gap: 5 (ref 5–15)
BUN: 24 mg/dL — ABNORMAL HIGH (ref 8–23)
CO2: 25 mmol/L (ref 22–32)
Calcium: 8.4 mg/dL — ABNORMAL LOW (ref 8.9–10.3)
Chloride: 111 mmol/L (ref 98–111)
Creatinine, Ser: 0.71 mg/dL (ref 0.61–1.24)
GFR, Estimated: >60 mL/min (ref >60)
Glucose, Bld: 119 mg/dL — ABNORMAL HIGH (ref 70–99)
Potassium: 3.4 mmol/L — ABNORMAL LOW (ref 3.5–5.1)
Sodium: 141 mmol/L (ref 135–145)

## 2023-08-13 LAB — MAGNESIUM: Magnesium: 2.1 mg/dL (ref 1.7–2.4)

## 2023-08-13 LAB — WOUND CULTURE
MICRO NUMBER:: 16206443
SPECIMEN QUALITY:: ADEQUATE

## 2023-08-13 LAB — HOUSE ACCOUNT TRACKING

## 2023-08-13 SURGERY — AMPUTATION, TOE
Anesthesia: General | Site: Toe | Laterality: Bilateral

## 2023-08-13 SURGERY — AMPUTATION, TOE
Anesthesia: Choice | Site: Toe | Laterality: Left

## 2023-08-13 MED ORDER — PROPOFOL 10 MG/ML IV BOLUS
INTRAVENOUS | Status: DC | PRN
  Administered 2023-08-13: 30 mg via INTRAVENOUS

## 2023-08-13 MED ORDER — 0.9 % SODIUM CHLORIDE (POUR BTL) OPTIME
TOPICAL | Status: DC | PRN
  Administered 2023-08-13: 1000 mL

## 2023-08-13 MED ORDER — LIDOCAINE 2% (20 MG/ML) 5 ML SYRINGE
INTRAMUSCULAR | Status: DC | PRN
  Administered 2023-08-13: 40 mg via INTRAVENOUS

## 2023-08-13 MED ORDER — ACETAMINOPHEN 500 MG PO TABS
1000.0000 mg | ORAL_TABLET | Freq: Once | ORAL | Status: AC
  Administered 2023-08-13: 1000 mg via ORAL
  Filled 2023-08-13: qty 2

## 2023-08-13 MED ORDER — VANCOMYCIN HCL 750 MG/150ML IV SOLN
750.0000 mg | Freq: Three times a day (TID) | INTRAVENOUS | Status: DC
  Administered 2023-08-13 – 2023-08-14 (×2): 750 mg via INTRAVENOUS
  Filled 2023-08-13 (×4): qty 150

## 2023-08-13 MED ORDER — ORAL CARE MOUTH RINSE: 15.0000 mL | Freq: Once | OROMUCOSAL | Status: AC

## 2023-08-13 MED ORDER — POTASSIUM CHLORIDE CRYS ER 20 MEQ PO TBCR
40.0000 meq | EXTENDED_RELEASE_TABLET | Freq: Once | ORAL | Status: AC
  Administered 2023-08-13: 40 meq via ORAL
  Filled 2023-08-13: qty 2

## 2023-08-13 MED ORDER — ONDANSETRON HCL 4 MG/2ML IJ SOLN
INTRAMUSCULAR | Status: DC | PRN
  Administered 2023-08-13: 4 mg via INTRAVENOUS

## 2023-08-13 MED ORDER — CHLORHEXIDINE GLUCONATE 0.12 % MT SOLN: 15.0000 mL | Freq: Once | OROMUCOSAL | Status: DC

## 2023-08-13 MED ORDER — PROPOFOL 500 MG/50ML IV EMUL
INTRAVENOUS | Status: DC | PRN
Start: 2023-08-13 — End: 2023-08-13
  Administered 2023-08-13: 25 ug/kg/min via INTRAVENOUS

## 2023-08-13 MED ORDER — LACTATED RINGERS IV SOLN: INTRAVENOUS | Status: DC

## 2023-08-13 MED ORDER — LIDOCAINE HCL (PF) 1 % IJ SOLN
INTRAMUSCULAR | Status: DC | PRN
  Administered 2023-08-13: 10 mL

## 2023-08-13 MED ORDER — ORAL CARE MOUTH RINSE: 15.0000 mL | Freq: Once | OROMUCOSAL | Status: DC

## 2023-08-13 MED ORDER — LIDOCAINE HCL (PF) 1 % IJ SOLN
INTRAMUSCULAR | Status: AC
  Filled 2023-08-13: qty 30

## 2023-08-13 MED ORDER — BUPIVACAINE HCL (PF) 0.5 % IJ SOLN
INTRAMUSCULAR | Status: AC
  Filled 2023-08-13: qty 30

## 2023-08-13 MED ORDER — CHLORHEXIDINE GLUCONATE 0.12 % MT SOLN
15.0000 mL | Freq: Once | OROMUCOSAL | Status: AC
  Administered 2023-08-13: 15 mL via OROMUCOSAL
  Filled 2023-08-13: qty 15

## 2023-08-13 MED ORDER — BUPIVACAINE HCL (PF) 0.5 % IJ SOLN
INTRAMUSCULAR | Status: DC | PRN
  Administered 2023-08-13: 10 mL

## 2023-08-13 MED ORDER — CELECOXIB 200 MG PO CAPS
200.0000 mg | ORAL_CAPSULE | Freq: Once | ORAL | Status: AC
  Administered 2023-08-13: 200 mg via ORAL
  Filled 2023-08-13: qty 1

## 2023-08-13 SURGICAL SUPPLY — 38 items
BLADE OSC/SAGITTAL MD 5.5X18 (BLADE) IMPLANT
BLADE SURG 10 STRL SS (BLADE) ×1 IMPLANT
BLADE SURG 15 STRL LF DISP TIS (BLADE) ×1 IMPLANT
BNDG COHESIVE 3X5 TAN ST LF (GAUZE/BANDAGES/DRESSINGS) ×1 IMPLANT
BNDG ELASTIC 3INX 5YD STR LF (GAUZE/BANDAGES/DRESSINGS) ×1 IMPLANT
BNDG ELASTIC 4INX 5YD STR LF (GAUZE/BANDAGES/DRESSINGS) IMPLANT
BNDG ESMARK 4X9 LF (GAUZE/BANDAGES/DRESSINGS) ×1 IMPLANT
BNDG GAUZE DERMACEA FLUFF 4 (GAUZE/BANDAGES/DRESSINGS) IMPLANT
CHLORAPREP W/TINT 26 (MISCELLANEOUS) IMPLANT
DRAPE BILATERAL LIMB T (DRAPES) IMPLANT
DRSG ADAPTIC 3X8 NADH LF (GAUZE/BANDAGES/DRESSINGS) IMPLANT
DRSG XEROFORM 1X8 (GAUZE/BANDAGES/DRESSINGS) IMPLANT
ELECT REM PT RETURN 9FT ADLT (ELECTROSURGICAL) ×1 IMPLANT
ELECTRODE REM PT RTRN 9FT ADLT (ELECTROSURGICAL) ×1 IMPLANT
GAUZE PAD ABD 8X10 STRL (GAUZE/BANDAGES/DRESSINGS) IMPLANT
GAUZE SPONGE 2X2 STRL 8-PLY (GAUZE/BANDAGES/DRESSINGS) IMPLANT
GAUZE SPONGE 4X4 12PLY STRL (GAUZE/BANDAGES/DRESSINGS) ×1 IMPLANT
GAUZE STRETCH 2X75IN STRL (MISCELLANEOUS) ×1 IMPLANT
GAUZE XEROFORM 1X8 LF (GAUZE/BANDAGES/DRESSINGS) ×1 IMPLANT
GLOVE BIO SURGEON STRL SZ7.5 (GLOVE) ×1 IMPLANT
GLOVE BIOGEL PI IND STRL 7.5 (GLOVE) ×1 IMPLANT
GOWN STRL REUS W/ TWL LRG LVL3 (GOWN DISPOSABLE) ×2 IMPLANT
KIT BASIN OR (CUSTOM PROCEDURE TRAY) ×1 IMPLANT
NDL BIOPSY JAMSHIDI 8X6 (NEEDLE) IMPLANT
NDL HYPO 25X1 1.5 SAFETY (NEEDLE) ×1 IMPLANT
NEEDLE BIOPSY JAMSHIDI 8X6 (NEEDLE) IMPLANT
NEEDLE HYPO 25X1 1.5 SAFETY (NEEDLE) ×1 IMPLANT
PACK ORTHO EXTREMITY (CUSTOM PROCEDURE TRAY) ×1 IMPLANT
PADDING CAST ABS COTTON 4X4 ST (CAST SUPPLIES) ×2 IMPLANT
SET HNDPC FAN SPRY TIP SCT (DISPOSABLE) IMPLANT
SPIKE FLUID TRANSFER (MISCELLANEOUS) IMPLANT
STOCKINETTE 4X48 STRL (DRAPES) IMPLANT
SUT ETHILON 3 0 FSLX (SUTURE) IMPLANT
SUT PROLENE 3 0 PS 2 (SUTURE) IMPLANT
SUT PROLENE 4 0 PS 2 18 (SUTURE) IMPLANT
SYR CONTROL 10ML LL (SYRINGE) ×1 IMPLANT
UNDERPAD 30X36 HEAVY ABSORB (UNDERPADS AND DIAPERS) ×1 IMPLANT
WATER STERILE IRR 1000ML POUR (IV SOLUTION) ×1 IMPLANT

## 2023-08-13 NOTE — Op Note (Signed)
 Full Operative Report  Date of Operation: 10:08 AM, 08/13/2023   Patient: Levi [name] - 78 y.o. male  Surgeon: Pilar Plate, DPM   Assistant: None  Diagnosis: osteomyelitis of second toe  Procedure:  1. LEFT second toe amputation at MPJ level 2. RIGHT fifth toe amputation at MPJ level    Anesthesia: Louie Casa, MD  Anesthesiologist: Bethena Midget, MD CRNA: Alwyn Ren, CRNA   Estimated Blood Loss: Minimal  Hemostasis: 1) Anatomical dissection, mechanical compression, electrocautery 2) No tourniquet was used during the procedure  Implants: * No implants in log *  Materials: Prolene 3-0  Injectables: 1) Pre-operatively: 20 cc of 50:50 mixture 1%lidocaine plain and 0.5% marcaine plain 2) Post-operatively: None   Specimens: Pathology: Left 2nd toe, Right 5th toe for path  Microbiology: L 2nd toe bone culture   Antibiotics: IV antibiotics given per schedule on the floor  Drains: None  Complications: Patient tolerated the procedure well without complication.   Operative findings: As below in detailed report  Indications for Procedure: Levi [name] presents to Pilar Plate, DPM with a chief complaint of infection in Left 2nd toe and right 5th toe with underlying osteomyelitis  The patient has failed conservative treatments of various modalities. At this time the patient has elected to proceed with surgical correction. All alternatives, risks, and complications of the procedures were thoroughly explained to the patient. Patient exhibits appropriate understanding of all discussion points and informed consent was signed and obtained in the chart with no guarantees to surgical outcome given or implied.  Description of Procedure: Patient was brought to the operating room. Patient remained on their hospital bed in the supine position. A surgical timeout was performed and all members of the operating room, the procedure, and the surgical  site were identified. anesthesia occurred as per anesthesia record. Local anesthetic as previously described was then injected about the operative field in a local infiltrative block.  The operative lower extremity as noted above was then prepped and draped in the usual sterile manner. The following procedure then began.  Attention was directed to the second digit on the LEFT foot. A full-thickness incision encompassing the entire digit was made using a #15 blade. Dissection was carried down to bone. The toe was secured with a towel clamp, further dissected in its entirety, and disarticulated at the MPJ and passed to the back table as a gross specimen. This was then labled and sent to pathology. The bone was noted to be soft and eroded, and consistent with osteomyelitis. All remaining necrotic and devitalized soft tissue structures were visualized and dissected away using sharp and dull dissection. Care was taken to protect all neurovascular structures throughout the dissection. All bleeders were cauterized as necessary. A bone culture was taken for the left 2nd toe PIPJ. The area was then flushed with copious amounts of sterile saline. Then using the suture materials previously described, the site was closed in anatomic layers and the skin was well approximated under minimal tension.  Attention was directed to the fifth digit on the RIGHT foot. A full-thickness incision encompassing the entire digit was made using a #15 blade. Dissection was carried down to bone. The toe was secured with a towel clamp, further dissected in its entirety, and disarticulated at the MPJ and passed to the back table as a gross specimen. This was then labled and sent to pathology. The bone was noted to be soft and eroded, and consistent with osteomyelitis. All remaining necrotic  and devitalized soft tissue structures were visualized and dissected away using sharp and dull dissection. Care was taken to protect all neurovascular  structures throughout the dissection. All bleeders were cauterized as necessary.   The area was then flushed with copious amounts of sterile saline. Then using the suture materials previously described, the site was closed in anatomic layers and the skin was well approximated under minimal tension.   The surgical site was then dressed with xerform 4x4 kerlix and ace wrap. The patient tolerated both the procedure and anesthesia well with vital signs stable throughout. The patient was transferred in good condition and all vital signs stable  from the OR to recovery under the discretion of anesthesia.  Condition: Vital signs stable, neurovascular status unchanged from preoperative   Surgical plan:  Clean margin expected, post op shoe bilateral WBAT. 7 days cephalexin 500 mg TID for DC. Stable for discharge home tomorrow, leave dressings intact, follow up next week.   The patient will be WBAT in a post op shoe to the operative limb until further instructed. The dressing is to remain clean, dry, and intact. Will continue to follow unless noted elsewhere.   Carlena Hurl, DPM [facility]

## 2023-08-13 NOTE — Consult Note (Signed)
 PODIATRY CONSULTATION  NAME Levi [name] MRN [id number] DOB August 07, 1945 DOA 08/12/2023   Reason for consult: Toe ulcers  Attending/Consulting physician: B. Dahal  History of present illness: 78 y.o. male past medical history of HTN, HLD, A-fib on Eliquis, CHF, COPD, BPH, GERD, neuropathy. who was sent to [facility] yesterday with concern for osteomyelitis of the left second toe.  He was supposed to have procedure done at [facility] but due to them not receiving a culture it was canceled and he was transported to the hospital.  When I saw the patient in preoperative area today he confirmed that the plan is for left second toe amputation.  He also asked me to look at his right fifth toe which has been hurting him and has been red and swollen as well as has an ulcer.  He asked if I could also evaluate that.  He does report some pain in the toes occasionally.  Denies a history of diabetes  Past Medical History:  Diagnosis Date   Agent orange exposure 1970's   takes Imdur,Diltiazem, and Atacand daily   Agent orange exposure    ALLERGIC RHINITIS    Allergy    seasonal allergies   Anemia    iron deficiency   Asthma    uses inhaler and nebulizers   Atrial fibrillation (HCC)    Bruises easily    d/t prednisone daily   Cataract    Cellulitis 03/22/2020   Chest wall pain 09/21/2019   L scapula, reported 4/27 21/     CHF (congestive heart failure) (HCC)    dx-on meds to treat   Chronic bronchitis (HCC)    "used to get it q yr; last time ?2008" (04/28/2012)   Clotting disorder (HCC) 2019   On blood thinner when I bleed it is for a consideral amount of time. This is tegardless of size of wound.   COPD (chronic obstructive pulmonary disease) (HCC)    agent orange exposure   Degenerative disk disease    "everywhere" (04/28/2012)   Diverticulitis    Dysrhythmia    afib   Elevated uric acid in blood    takes Allopurinol daily   Emphysema of lung (HCC)    Enlarged prostate    but not  on any meds   GERD (gastroesophageal reflux disease)    takes Protonix daily   H/O hiatal hernia    Headache    History of colonoscopy    History of pneumonia    a. 2010   Hyperlipidemia    takes Lipitor daily; pt. states he takes a preventive   Hypothyroidism    Joint pain    Joint swelling    Leukocytosis    MSSA bacteremia 03/19/2023   Nausea and vomiting 11/12/2022   Neuromuscular disorder (HCC)    bilat legs, and bilat arms   Neuropathy    Osteoporosis 2012   PAF (paroxysmal atrial fibrillation) (HCC)    a. Dx 04/2012, CHA2DS2VASc = 1 (age);  b. 04/2012 Echo: EF 40-50%, mild MR.   Personal history of colonic adenomas 02/09/2013   Pneumonia    Sepsis (HCC) 03/18/2023   Shortness of breath dyspnea    Thyroid disease    no taking meds-caused dizziness       Latest Ref Rng & Units 08/13/2023    5:27 AM 08/12/2023    1:48 PM 07/31/2023    8:40 AM  CBC  WBC 4.0 - 10.5 K/uL 6.0  9.1  9.0  Hemoglobin 13.0 - 17.0 g/dL 40.9  81.1  91.4   Hematocrit 39.0 - 52.0 % 36.3  39.3  37.1   Platelets 150 - 400 K/uL 150  180  159        Latest Ref Rng & Units 08/13/2023    5:27 AM 08/12/2023    1:48 PM 07/31/2023    8:40 AM  BMP  Glucose 70 - 99 mg/dL 782  91  956   BUN 8 - 23 mg/dL 24  23  26    Creatinine 0.61 - 1.24 mg/dL 2.13  0.86  5.78   Sodium 135 - 145 mmol/L 141  139  136   Potassium 3.5 - 5.1 mmol/L 3.4  3.3  4.1   Chloride 98 - 111 mmol/L 111  110  106   CO2 22 - 32 mmol/L 25  25  22    Calcium 8.9 - 10.3 mg/dL 8.4  9.1  8.9       Physical Exam: Lower Extremity Exam Vasc: R - PT 1 out of 4 palpable, DP palpable. Cap refill < 3 sec to digits  L - PT 1/4 palpable, DP palpable. Cap refill <3 sec to digits  Derm: R -ulceration dorsal aspect fifth toe PIPJ erythema and edema of the fifth toe  L -ulceration dorsal aspect second toe PIPJ with erythema and edema of the second toe  MSK:  R -Charcot deformities noted  L -Charcot deformities noted  Neuro: R - Gross  sensation diminishe. Gross motor function intact   L - Gross sensation diminished. Gross motor function intact    ASSESSMENT/PLAN OF CARE 78 y.o. male with PMHx significant for   HTN, HLD, A-fib on Eliquis, CHF, COPD, BPH, GERD, neuropathy with ulceration cellulitis and underlying osteomyelitis of the left second toe and right fifth toe  WBC 6  -N.p.o. for OR this morning for left second toe amputation at MPJ level and right fifth toe amputation at MPJ level.  Discussed with patient he wishes to proceed with both of these procedures consent was obtained - Continue IV abx broad spectrum preop and switch to p.o. antibiotics for 7 days postop - Anticoagulation: Okay to resume postoperatively - Wound care: None needed.  Leave dressings clean dry and intact - WB status: Weightbearing as tolerated in postop shoe bilateral foot - Will continue to follow   Thank you for the consult.  Please contact me directly with any questions or concerns.           Corinna Gab, DPM [facility] / [facility]    [address] [place], [place] [address]                Office [phone]  Fax [phone]

## 2023-08-13 NOTE — Plan of Care (Addendum)
 Pt is alert and oriented x 4. Up with assist. On call provider contacted due to notes indicate NPO for procedure this am. NO npo orders. All held orders are for previous procedures or appointment. Pt had 15 beat vtach during overnight. NP contacted. Vitals stable. Latest labs reported. NO mag level this admission. Provider added mag to am labs.  Problem: Education: Goal: Knowledge of General Education information will improve Description: Including pain rating scale, medication(s)/side effects and non-pharmacologic comfort measures Outcome: Progressing   Problem: Health Behavior/Discharge Planning: Goal: Ability to manage health-related needs will improve Outcome: Progressing   Problem: Clinical Measurements: Goal: Ability to maintain clinical measurements within normal limits will improve Outcome: Progressing Goal: Will remain free from infection Outcome: Progressing Goal: Diagnostic test results will improve Outcome: Progressing Goal: Respiratory complications will improve Outcome: Progressing Goal: Cardiovascular complication will be avoided Outcome: Progressing   Problem: Activity: Goal: Risk for activity intolerance will decrease Outcome: Progressing   Problem: Nutrition: Goal: Adequate nutrition will be maintained Outcome: Progressing   Problem: Coping: Goal: Level of anxiety will decrease Outcome: Progressing   Problem: Elimination: Goal: Will not experience complications related to bowel motility Outcome: Progressing Goal: Will not experience complications related to urinary retention Outcome: Progressing   Problem: Pain Managment: Goal: General experience of comfort will improve and/or be controlled Outcome: Progressing   Problem: Safety: Goal: Ability to remain free from injury will improve Outcome: Progressing   Problem: Skin Integrity: Goal: Risk for impaired skin integrity will decrease Outcome: Progressing

## 2023-08-13 NOTE — Anesthesia Preprocedure Evaluation (Signed)
 Anesthesia Evaluation  Patient identified by MRN, date of birth, ID band Patient awake    Reviewed: Allergy & Precautions, NPO status , Patient's Chart, lab work & pertinent test results  Airway Mallampati: I  TM Distance: >3 FB Neck ROM: Full    Dental   Pulmonary shortness of breath, asthma , pneumonia, COPD   Pulmonary exam normal        Cardiovascular hypertension, Pt. on home beta blockers and Pt. on medications +CHF  Normal cardiovascular exam+ dysrhythmias Atrial Fibrillation   ECHO 10/24 1. Left ventricular ejection fraction, by estimation, is 40 to 45%. The  left ventricle has mildly decreased function. The left ventricle  demonstrates global hypokinesis. Left ventricular diastolic parameters are  consistent with Grade I diastolic  dysfunction (impaired relaxation).   2. Right ventricular systolic function is normal. The right ventricular  size is normal.   3. The mitral valve is normal in structure. Mild mitral valve  regurgitation. No evidence of mitral stenosis.   4. The aortic valve is tricuspid. There is mild calcification of the  aortic valve. Aortic valve regurgitation is mild. Mild aortic valve  stenosis. Aortic regurgitation PHT measures 332 msec. Aortic valve area,  by VTI measures 1.42 cm. Aortic valve mean   gradient measures 12.0 mmHg. Aortic valve Vmax measures 2.28 m/s.     Neuro/Psych  Headaches  Neuromuscular disease    GI/Hepatic hiatal hernia,GERD  Medicated and Controlled,,  Endo/Other  Hypothyroidism    Renal/GU      Musculoskeletal  (+) Arthritis ,    Abdominal   Peds  Hematology  (+) Blood dyscrasia, anemia   Anesthesia Other Findings   Reproductive/Obstetrics                              Anesthesia Physical Anesthesia Plan  ASA: 3  Anesthesia Plan: General   Post-op Pain Management: Minimal or no pain anticipated   Induction:  Intravenous  PONV Risk Score and Plan: 2 and Dexamethasone, Ondansetron and Treatment may vary due to age or medical condition  Airway Management Planned: Oral ETT and LMA  Additional Equipment: None  Intra-op Plan:   Post-operative Plan: Extubation in OR  Informed Consent: I have reviewed the patients History and Physical, chart, labs and discussed the procedure including the risks, benefits and alternatives for the proposed anesthesia with the patient or authorized representative who has indicated his/her understanding and acceptance.       Plan Discussed with: CRNA and Anesthesiologist  Anesthesia Plan Comments:          Anesthesia Quick Evaluation

## 2023-08-13 NOTE — Anesthesia Postprocedure Evaluation (Signed)
 Anesthesia Post Note  Patient: Levi [name]  Procedure(s) Performed: LEFT SECOND TOE AMPUTATION AND RIGHT FIFTH TOE AMPUTATION (Bilateral: Toe)     Patient location during evaluation: PACU Anesthesia Type: General Level of consciousness: awake and alert Pain management: pain level controlled Vital Signs Assessment: post-procedure vital signs reviewed and stable Respiratory status: spontaneous breathing, nonlabored ventilation, respiratory function stable and patient connected to nasal cannula oxygen Cardiovascular status: blood pressure returned to baseline and stable Postop Assessment: no apparent nausea or vomiting Anesthetic complications: no   No notable events documented.  Last Vitals:  Vitals:   08/13/23 1116 08/13/23 1505  BP: 132/67 109/70  Pulse: (!) 59 (!) 41  Resp: 18 15  Temp: (!) 36.3 C 36.4 C  SpO2: 99% 100%    Last Pain:  Vitals:   08/13/23 1505  TempSrc: Oral  PainSc:                  Micharl Helmes

## 2023-08-13 NOTE — Progress Notes (Signed)
 Orthopedic Tech Progress Note Patient Details:  Levi [name] [birth year] [id number]  Ortho Devices Type of Ortho Device: Postop shoe/boot Ortho Device/Splint Location: BLE Ortho Device/Splint Interventions: Ordered, Other (comment)   Post Interventions Patient Tolerated: Well Instructions Provided: Care of device  [name] 08/13/2023, 11:33 AM

## 2023-08-13 NOTE — Progress Notes (Signed)
 PROGRESS NOTE  Levi [name]  DOB: 1945-08-08  PCP: Venita Sheffield, MD WUJ:[id number]  DOA: 08/12/2023  LOS: 0 days  Hospital Day: 2  Brief narrative:  Levi [name] is a 78 y.o. male with PMH significant for HTN, HLD, A-fib on Eliquis, CHF, COPD, BPH, GERD, neuropathy. Patient was sent to the ED by podiatry today with a plan of left foot surgery tomorrow. Patient has been following up with podiatry as an outpatient for left second toe wound.  On a course of Bactrim and azithromycin.  Last seen by Dr. Ralene Cork on 3/13, noted to have purulence and had concern for potential osteomyelitis.  Wound culture was taken.  Was scheduled for outpatient left second toe amputation at the surgery center today 3/18.  Off anticoagulation in anticipation of surgery. Per report, surgery could not be done today in absence of wound culture result.  Podiatrist directed the patient to ED for inpatient hospitalization and procedure tomorrow.  In the ED, patient was afebrile, hemodynamically stable. Labs with WBC count normal at 9.1, hemoglobin 12.4, potassium 3.3. ED physician discussed with podiatrist Dr. Annamary Rummage. Confirmed the plan of procedure tomorrow.  N.p.o. after midnight. IV vancomycin was recommended Hospitalist service was consulted for inpatient management  Subjective: Patient was seen and examined this afternoon.  Underwent surgery on both feet earlier this afternoon Not in distress per wife at bedside  Assessment/Plan: Acute osteomyelitis of left second toe  Acute osteomyelitis of right fifth toe Did not improve with outpatient course of Bactrim and azithromycin Underwent amputation of both toes at MPJ level this afternoon. Currently on IV vancomycin Pain meds PRN Per podiatry note, postop shoe WBAT allowed.  70 cephalexin 500 mg 3 times daily at discharge hopefully tomorrow.  A-fib Chronically anticoagulated with Eliquis.  On hold for last few days in anticipation of surgery.  Plan  to resume after surgery  CHF Hypertension PTA meds- metoprolol, Imdur, Lasix as needed  Hypokalemia Potassium low at 3.4 today.  Ordered replacement. Recent Labs  Lab 08/12/23 1348 08/13/23 0527  K 3.3* 3.4*  MG  --  2.1   Hyperlipidemia Lipitor  COPD Patient states he had exposure to agent orange and got lung damage.  He is chronically on prednisone for that.  Does not use supplemental oxygen.  BPH Flomax  Neuropathy Lyrica, Cymbalta  Chronic anemia GERD Continue PPI and iron supplement Recent Labs    05/02/23 0931 05/27/23 1435 06/17/23 1129 07/31/23 0840 08/12/23 1348 08/13/23 0527  HGB 11.3* 10.2*  --  11.7* 12.4* 11.5*  MCV 93 92.2  --  93.9 95.4 95.0  VITAMINB12  --   --  202  --   --   --   FERRITIN  --   --  41  --   --   --   TIBC  --   --  381  --   --   --   IRON  --   --  34*  --   --   --     Mobility: Uses a cane  Goals of care:   Code Status: Full Code    DVT prophylaxis:  enoxaparin (LOVENOX) injection 40 mg Start: 08/13/23 2000   Antimicrobials: IV vancomycin Fluid: None Consultants: Podiatry Family Communication: Wife at bedside  Status: Observation Level of care:  Telemetry Medical   Patient is from: Home Anticipated d/c to: POD 0.  Hopefully home tomorrow  Diet: Diet Order  Diet Heart Room service appropriate? Yes; Fluid consistency: Thin  Diet effective now                   Scheduled Meds:  atorvastatin  20 mg Oral Daily   DULoxetine  30 mg Oral Daily   enoxaparin (LOVENOX) injection  40 mg Subcutaneous Q24H   ferrous gluconate  324 mg Oral Daily   isosorbide mononitrate  30 mg Oral Daily   metoprolol succinate  50 mg Oral Daily   pantoprazole  40 mg Oral QAC breakfast   potassium chloride  40 mEq Oral Once   predniSONE  20 mg Oral q AM   pregabalin  100 mg Oral BID   tamsulosin  0.4 mg Oral QHS    PRN meds: acetaminophen **OR** acetaminophen, albuterol, bisacodyl, hydrALAZINE, HYDROmorphone  (DILAUDID) injection, oxyCODONE, polyethylene glycol   Infusions:   vancomycin      Antimicrobials: Anti-infectives (From admission, onward)    Start     Dose/Rate Route Frequency Ordered Stop   08/13/23 2100  vancomycin (VANCOREADY) IVPB 750 mg/150 mL        750 mg 150 mL/hr over 60 Minutes Intravenous Every 8 hours 08/13/23 1113     08/13/23 0800  vancomycin (VANCOREADY) IVPB 1250 mg/250 mL  Status:  Discontinued        1,250 mg 166.7 mL/hr over 90 Minutes Intravenous Every 12 hours 08/12/23 1810 08/13/23 1113   08/12/23 1445  vancomycin (VANCOREADY) IVPB 2000 mg/400 mL        2,000 mg 200 mL/hr over 120 Minutes Intravenous  Once 08/12/23 1432 08/12/23 2235       Objective: Vitals:   08/13/23 1116 08/13/23 1505  BP: 132/67 109/70  Pulse: (!) 59 (!) 41  Resp: 18 15  Temp: (!) 97.4 F (36.3 C) 97.6 F (36.4 C)  SpO2: 99% 100%    Intake/Output Summary (Last 24 hours) at 08/13/2023 1613 Last data filed at 08/13/2023 1500 Gross per 24 hour  Intake 440 ml  Output 5 ml  Net 435 ml   Filed Weights   08/12/23 1321 08/12/23 2007 08/13/23 0916  Weight: 92.5 kg 90.7 kg 90.7 kg   Weight change:  Body mass index is 25.68 kg/m.   Physical Exam: General exam: Pleasant, elderly Caucasian male.  Not in distress. Skin: No rashes, lesions or ulcers. HEENT: Atraumatic, normocephalic, no obvious bleeding Lungs: Clear to auscultation bilaterally,  CVS: S1, S2, no murmur,   GI/Abd: Soft, nontender, nondistended, bowel sound present,   CNS: Alert, awake, oriented x 3 Psychiatry: Mood appropriate Extremities: Both feet postsurgical status  Data Review: I have personally reviewed the laboratory data and studies available.  F/u labs ordered Unresulted Labs (From admission, onward)     Start     Ordered   08/14/23 0500  CBC with Differential/Platelet  Tomorrow morning,   R        08/13/23 1613   08/14/23 0500  Basic metabolic panel  Tomorrow morning,   R        08/13/23 1613    08/13/23 1000  Fungus Culture With Stain  RELEASE UPON ORDERING,   TIMED       Comments: Specimen A: Phone [phone] Immunocompromised?  No  Antibiotic Treatment:  Vancomycin Is the patient on airborne/droplet precautions? No Clinical History:   osteomyelitis of second toe Special Instructions:  none Specimen Disposition:  Microbiology     08/13/23 1000   08/13/23 1000  Fungus Stain  RELEASE UPON ORDERING,  TIMED       Comments: Specimen A: Phone [phone] Immunocompromised?  No  Antibiotic Treatment:  Vancomycin Is the patient on airborne/droplet precautions? No Clinical History:   osteomyelitis of second toe Special Instructions:  none Specimen Disposition:  Microbiology     08/13/23 1000   08/13/23 1000  Acid Fast Culture with reflexed sensitivities  RELEASE UPON ORDERING,   TIMED       Comments: Specimen A: Phone [phone] Immunocompromised?  No  Antibiotic Treatment:  Vancomycin Is the patient on airborne/droplet precautions? No Clinical History:   osteomyelitis of second toe Special Instructions:  none Specimen Disposition:  Microbiology     08/13/23 1000   08/13/23 1000  Acid Fast Smear (AFB)  RELEASE UPON ORDERING,   TIMED       Comments: Specimen A: Phone [phone] Immunocompromised?  No  Antibiotic Treatment:  Vancomycin Is the patient on airborne/droplet precautions? No Clinical History:   osteomyelitis of second toe Special Instructions:  none Specimen Disposition:  Microbiology     08/13/23 1000           Total time spent in review of labs and imaging, patient evaluation, formulation of plan, documentation and communication with family: 45 minutes  Signed, Lorin Glass, MD [facility] 08/13/2023

## 2023-08-13 NOTE — Transfer of Care (Signed)
 Immediate Anesthesia Transfer of Care Note  Patient: Levi [name]  Procedure(s) Performed: LEFT SECOND TOE AMPUTATION AND RIGHT FIFTH TOE AMPUTATION (Bilateral: Toe)  Patient Location: PACU  Anesthesia Type:MAC  Level of Consciousness: awake, alert , and oriented  Airway & Oxygen Therapy: Patient Spontanous Breathing  Post-op Assessment: Report given to RN and Post -op Vital signs reviewed and stable  Post vital signs: Reviewed and stable  Last Vitals:  Vitals Value Taken Time  BP 120/78 08/13/23 1018  Temp    Pulse 75 08/13/23 1022  Resp 23 08/13/23 1022  SpO2 98 % 08/13/23 1022  Vitals shown include unfiled device data.  Last Pain:  Vitals:   08/13/23 0916  TempSrc: Oral  PainSc:          Complications: No notable events documented.

## 2023-08-14 ENCOUNTER — Other Ambulatory Visit (HOSPITAL_COMMUNITY): Payer: Self-pay

## 2023-08-14 ENCOUNTER — Encounter (HOSPITAL_COMMUNITY): Payer: Self-pay | Admitting: Podiatry

## 2023-08-14 DIAGNOSIS — M869 Osteomyelitis, unspecified: Secondary | ICD-10-CM | POA: Diagnosis not present

## 2023-08-14 LAB — CBC WITH DIFFERENTIAL/PLATELET
Abs Immature Granulocytes: 0 10*3/uL (ref 0.00–0.07)
Basophils Absolute: 0.1 10*3/uL (ref 0.0–0.1)
Basophils Relative: 2 %
Eosinophils Absolute: 0.1 10*3/uL (ref 0.0–0.5)
Eosinophils Relative: 2 %
HCT: 36.8 % — ABNORMAL LOW (ref 39.0–52.0)
Hemoglobin: 11.5 g/dL — ABNORMAL LOW (ref 13.0–17.0)
Lymphocytes Relative: 19 %
Lymphs Abs: 1.2 10*3/uL (ref 0.7–4.0)
MCH: 30.2 pg (ref 26.0–34.0)
MCHC: 31.3 g/dL (ref 30.0–36.0)
MCV: 96.6 fL (ref 80.0–100.0)
Monocytes Absolute: 0.2 10*3/uL (ref 0.1–1.0)
Monocytes Relative: 3 %
Neutro Abs: 4.7 10*3/uL (ref 1.7–7.7)
Neutrophils Relative %: 74 %
Platelets: 148 10*3/uL — ABNORMAL LOW (ref 150–400)
RBC: 3.81 MIL/uL — ABNORMAL LOW (ref 4.22–5.81)
RDW: 20.5 % — ABNORMAL HIGH (ref 11.5–15.5)
WBC: 6.3 10*3/uL (ref 4.0–10.5)
nRBC: 0 % (ref 0.0–0.2)
nRBC: 1 /100{WBCs} — ABNORMAL HIGH

## 2023-08-14 LAB — BASIC METABOLIC PANEL
Anion gap: 4 — ABNORMAL LOW (ref 5–15)
BUN: 20 mg/dL (ref 8–23)
CO2: 23 mmol/L (ref 22–32)
Calcium: 8.6 mg/dL — ABNORMAL LOW (ref 8.9–10.3)
Chloride: 111 mmol/L (ref 98–111)
Creatinine, Ser: 0.71 mg/dL (ref 0.61–1.24)
GFR, Estimated: >60 mL/min (ref >60)
Glucose, Bld: 91 mg/dL (ref 70–99)
Potassium: 3.6 mmol/L (ref 3.5–5.1)
Sodium: 138 mmol/L (ref 135–145)

## 2023-08-14 LAB — ACID FAST SMEAR (AFB, MYCOBACTERIA): Acid Fast Smear: NEGATIVE

## 2023-08-14 LAB — PATHOLOGIST SMEAR REVIEW

## 2023-08-14 LAB — SURGICAL PATHOLOGY

## 2023-08-14 MED ORDER — OXYCODONE HCL 5 MG PO TABS: 5.0000 mg | ORAL_TABLET | Freq: Four times a day (QID) | ORAL | 0 refills | Status: AC | PRN

## 2023-08-14 MED ORDER — CEPHALEXIN 500 MG PO CAPS: 500.0000 mg | ORAL_CAPSULE | Freq: Three times a day (TID) | ORAL | Status: DC

## 2023-08-14 MED ORDER — CEPHALEXIN 500 MG PO CAPS: 500.0000 mg | ORAL_CAPSULE | Freq: Three times a day (TID) | ORAL | 0 refills | Status: AC

## 2023-08-14 MED ORDER — FLORANEX PO PACK: 1.0000 g | PACK | Freq: Three times a day (TID) | ORAL | 0 refills | Status: AC

## 2023-08-14 NOTE — Progress Notes (Signed)
  Subjective:  Patient ID: Levi [name], male    DOB: 03-06-1946,  MRN: [id number]   DOS: 08/13/2023 Procedure: 1. LEFT second toe amputation at MPJ level 2. RIGHT fifth toe amputation at MPJ level  78 y.o. male seen for post op check.  Patient reports he is doing well his pain is well-controlled no complaints this morning has postop shoes in the room.  Aware of findings from surgery and plans for follow-up.  Review of Systems: Negative except as noted in the HPI. Denies N/V/F/Ch.   Objective:   Vitals:   08/14/23 0423 08/14/23 0739  BP: (!) 152/74 136/86  Pulse: (!) 52 80  Resp: 18 18  Temp: (!) 97.5 F (36.4 C) 97.8 F (36.6 C)  SpO2: 99% 99%   Body mass index is 26.13 kg/m. Constitutional Well developed. Well nourished.  Vascular Foot warm and well perfused. Capillary refill normal to all digits.   No calf pain with palpation  Neurologic Normal speech. Oriented to person, place, and time. Epicritic sensation diminished to bilateral foot  Dermatologic Dressing clean dry and intact bilaterally  Orthopedic: Status post left second toe amputation and right fifth toe amputation   Radiographs: Disarticulation of left second toe and right fifth toe at MPJ level  Pathology: Pending  Micro: No growth to date pending  Assessment:   Osteomyelitis of left second and right fifth toe status post amputation  Plan:  Patient was evaluated and treated and all questions answered.  POD # 1 s/p left second toe amputation at MPJ level and right fifth toe amputation MPJ level -Progressing as expected postoperatively pain well-controlled -XR: Expected postop changes -WB Status: Weightbearing as tolerated in postop shoe bilateral foot -Sutures: Remain intact. -Medications/ABX: Recommend 5 to 7 days cephalexin 500 mg 3 times daily -Dressing to remain clean dry and intact until follow-up next week Tuesday or Thursday office will call to arrange -Patient stable for discharge later  today will sign off        Corinna Gab, DPM [facility] / [facility]

## 2023-08-14 NOTE — Plan of Care (Signed)

## 2023-08-14 NOTE — Progress Notes (Signed)
 DISCHARGE NOTE HOME Sindy Guadeloupe to be discharged Home per MD order. Discussed prescriptions and follow up appointments with the patient. Prescriptions given to patient; medication list explained in detail. Patient verbalized understanding.  Skin clean, dry and intact without evidence of skin break down, no evidence of skin tears noted. IV catheter discontinued intact. Site without signs and symptoms of complications. Dressing and pressure applied. Pt denies pain at the site currently. No complaints noted.  See LDA for wounds discharging home with  All dressed appropriately  An After Visit Summary (AVS) was printed and given to the patient. Patient escorted via wheelchair, and discharged home via private auto.  Velia Meyer, RN

## 2023-08-14 NOTE — Discharge Summary (Addendum)
 Physician Discharge Summary  Levi [name]:[id number] DOB: 1945-06-16 DOA: 08/12/2023  PCP: Venita Sheffield, MD  Admit date: 08/12/2023 Discharge date: 08/14/2023  Admitted From: Home Discharge disposition: Home  Recommendations at discharge:  Complete the course of antibiotics with 7-day course of cephalexin 500 mg 3 times daily.   Allowed for postop shoe WBAT.   Continue cautious use of pain meds as needed.   Follow-up with podiatry as an outpatient.   Brief narrative:  Levi [name] is a 78 y.o. male with PMH significant for HTN, HLD, A-fib on Eliquis, CHF, COPD, BPH, GERD, neuropathy. Patient was sent to the ED by podiatry today with a plan of left foot surgery tomorrow. Patient has been following up with podiatry as an outpatient for left second toe wound.  On a course of Bactrim and azithromycin.  Last seen by Dr. Ralene Cork on 3/13, noted to have purulence and had concern for potential osteomyelitis.  Wound culture was taken.  Was scheduled for outpatient left second toe amputation at the surgery center today 3/18.  Off anticoagulation in anticipation of surgery. Per report, surgery could not be done today in absence of wound culture result.  Podiatrist directed the patient to ED for inpatient hospitalization and procedure tomorrow.  In the ED, patient was afebrile, hemodynamically stable. Labs with WBC count normal at 9.1, hemoglobin 12.4, potassium 3.3. ED physician discussed with podiatrist Dr. Annamary Rummage. Confirmed the plan of procedure tomorrow.  N.p.o. after midnight. IV vancomycin was recommended Hospitalist service was consulted for inpatient management 3/19, underwent amputation of left second toe and right fifth toe at MPJ level.  Subjective: Patient was seen and examined this morning. Not in distress. Followed by podiatry  Hospital course: Acute osteomyelitis of left second toe  Acute osteomyelitis of right fifth toe Did not improve with outpatient  course of Bactrim and azithromycin 3/19, underwent amputation of both toes at MPJ level Inpatient, he was treated with IV vancomycin. Per podiatry recommendation, plan to discharge on 7-day course of cephalexin 500 mg 3 times daily.  Allowed for postop shoe WBAT.  Continue pain meds PRN.  Follow-up with podiatry as an outpatient.  A-fib Chronically anticoagulated with Eliquis.  Resume at discharge  CHF Hypertension PTA meds- metoprolol, Imdur, Lasix as needed Continue the same plan  Hypokalemia Potassium level improved with replacement Recent Labs  Lab 08/12/23 1348 08/13/23 0527 08/14/23 0612  K 3.3* 3.4* 3.6  MG  --  2.1  --    Hyperlipidemia Lipitor  COPD Patient states he had exposure to agent orange and got lung damage.  He is chronically on prednisone for that.  Does not use supplemental oxygen.  BPH Flomax  Neuropathy Lyrica, Cymbalta  Chronic anemia GERD Continue PPI and iron supplement Recent Labs    05/27/23 1435 06/17/23 1129 07/31/23 0840 08/12/23 1348 08/13/23 0527 08/14/23 0612  HGB 10.2*  --  11.7* 12.4* 11.5* 11.5*  MCV 92.2  --  93.9 95.4 95.0 96.6  VITAMINB12  --  202  --   --   --   --   FERRITIN  --  41  --   --   --   --   TIBC  --  381  --   --   --   --   IRON  --  34*  --   --   --   --     Mobility: Uses a cane.  WBAT allowed.  Goals of care:   Code Status:  Full Code    Consultants: Podiatry Family Communication: Wife at bedside  Diet:  Diet Order             Diet general           Diet Heart Room service appropriate? Yes; Fluid consistency: Thin  Diet effective now                   Nutritional status:  Body mass index is 26.13 kg/m.       Wounds:  - Wound / Incision (Open or Dehisced) 03/20/23 Other (Comment) Pretibial Right;Lateral (Active)  Date First Assessed/Time First Assessed: 03/20/23 0055   Wound Type: (c) Other (Comment)  Location: Pretibial  Location Orientation: Right;Lateral  Present on  Admission: Yes    Assessments 03/20/2023 12:46 AM 04/22/2023  7:25 PM  Dressing Type None Foam - Lift dressing to assess site every shift  Dressing Status None Clean, Dry, Intact  Dressing Change Frequency -- Daily  Site / Wound Assessment Clean;Dry;Brown;Pink;Red --  Peri-wound Assessment Intact;Pink;Edema --  Wound Length (cm) 6 cm --  Wound Width (cm) 5 cm --  Wound Depth (cm) 0 cm --  Wound Volume (cm^3) 0 cm^3 --  Wound Surface Area (cm^2) 30 cm^2 --  Margins Unattached edges (unapproximated) --  Closure None --  Drainage Amount None --  Treatment Other (Comment) --     No associated orders.     Wound / Incision (Open or Dehisced) 04/17/23 Skin tear Pretibial Proximal;Right (Active)  Date First Assessed/Time First Assessed: 04/17/23 2000   Wound Type: Skin tear  Location: Pretibial  Location Orientation: Proximal;Right  Present on Admission: Yes    Assessments 04/18/2023  8:11 AM 04/22/2023  7:25 PM  Dressing Type Foam - Lift dressing to assess site every shift;Gauze (Comment) Gauze (Comment)  Dressing Changed New --  Dressing Status Clean, Dry, Intact Clean, Dry, Intact  Dressing Change Frequency -- Daily     No associated orders.     Wound / Incision (Open or Dehisced) 04/17/23 Skin tear Pretibial Left (Active)  Date First Assessed/Time First Assessed: 04/17/23 2208   Wound Type: Skin tear  Location: Pretibial  Location Orientation: Left  Present on Admission: Yes    Assessments 04/18/2023  4:00 PM 04/22/2023  7:25 PM  Dressing Type Gauze (Comment);Compression wrap;Abdominal pads Gauze (Comment)  Dressing Changed New --  Dressing Status Clean, Dry, Intact Clean, Dry, Intact  Dressing Change Frequency Daily Daily     No associated orders.     Wound / Incision (Open or Dehisced) 08/12/23 Venous stasis ulcer Toe (Comment  which one) Anterior;Left 1.5 cm in diameter (Active)  Date First Assessed/Time First Assessed: 08/12/23 2015   Wound Type: Venous stasis ulcer   Location: (c) Toe (Comment  which one)  Location Orientation: Anterior;Left  Wound Description (Comments): 1.5 cm in diameter  Present on Admission: Yes    Assessments 08/12/2023  8:17 PM 08/13/2023  8:15 AM  Dressing Type Gauze (Comment) Gauze (Comment)  Wound Length (cm) 1.5 cm --  Wound Width (cm) 1.5 cm --  Wound Surface Area (cm^2) 2.25 cm^2 --     No associated orders.     Wound / Incision (Open or Dehisced) 08/12/23 Venous stasis ulcer Toe (Comment  which one) Anterior;Right 1 cm x 1 cm (Active)  Date First Assessed/Time First Assessed: 08/12/23 2039   Wound Type: Venous stasis ulcer  Location: (c) Toe (Comment  which one)  Location Orientation: Anterior;Right  Wound Description (Comments): 1  cm x 1 cm  Present on Admission: Yes    Assessments 08/12/2023  8:17 PM 08/13/2023  8:15 AM  Dressing Type Gauze (Comment) Gauze (Comment)  Dressing Changed Changed --     No associated orders.     Wound / Incision (Open or Dehisced) 08/12/23 Venous stasis ulcer Toe (Comment  which one) Anterior;Left 1.5 line (Active)  Date First Assessed/Time First Assessed: 08/12/23 2040   Wound Type: Venous stasis ulcer  Location: Toe (Comment  which one)  Location Orientation: Anterior;Left  Wound Description (Comments): 1.5 line  Present on Admission: Yes    Assessments 08/12/2023  8:17 PM 08/13/2023  8:15 AM  Dressing Type Gauze (Comment) Gauze (Comment)  Dressing Changed Changed --     No associated orders.     Wound / Incision (Open or Dehisced) 08/12/23 Venous stasis ulcer Toe (Comment  which one) Anterior;Left 0.5 redness with center broken (Active)  Date First Assessed/Time First Assessed: 08/12/23 2041   Wound Type: Venous stasis ulcer  Location: Toe (Comment  which one)  Location Orientation: Anterior;Left  Wound Description (Comments): 0.5 redness with center broken  Present on Admission: Yes    Assessments 08/12/2023  8:17 PM 08/13/2023  8:15 AM  Dressing Type Gauze (Comment) Gauze (Comment)   Dressing Changed Changed --     No associated orders.     Wound / Incision (Open or Dehisced) 08/12/23 Venous stasis ulcer Tibial Left;Posterior 1.5 x 1 (Active)  Date First Assessed/Time First Assessed: 08/12/23 2042   Wound Type: Venous stasis ulcer  Location: Tibial  Location Orientation: Left;Posterior  Wound Description (Comments): 1.5 x 1    Assessments 08/12/2023  8:17 PM 08/13/2023  8:15 AM  Dressing Type Foam - Lift dressing to assess site every shift Foam - Lift dressing to assess site every shift  Dressing Changed Changed Reinforced     No associated orders.     Wound / Incision (Open or Dehisced) 08/12/23 Skin tear Arm Left;Lower;Posterior;Proximal (Active)  Date First Assessed/Time First Assessed: 08/12/23 2052   Wound Type: Skin tear  Location: Arm  Location Orientation: Left;Lower;Posterior;Proximal  Present on Admission: Yes    Assessments 08/12/2023  8:17 PM 08/13/2023  8:15 AM  Dressing Type Foam - Lift dressing to assess site every shift Foam - Lift dressing to assess site every shift  Dressing Changed -- Reinforced     No associated orders.     Incision (Closed) 08/13/23 Foot (Active)  Date First Assessed/Time First Assessed: 08/13/23 1003   Location: Foot    Assessments 08/13/2023 10:20 AM 08/14/2023 10:00 AM  Dressing Type Gauze (Comment);Compression wrap Other (Comment)  Dressing Clean, Dry, Intact Clean, Dry, Intact  Dressing Change Frequency -- Other (Comment)  Site / Wound Assessment Dressing in place / Unable to assess --  Drainage Amount None --     No associated orders.    Discharge Exam:   Vitals:   08/13/23 1505 08/13/23 2015 08/14/23 0423 08/14/23 0739  BP: 109/70 127/66 (!) 152/74 136/86  Pulse: (!) 41 84 (!) 52 80  Resp: 15 20 18 18   Temp: 97.6 F (36.4 C) (!) 97.4 F (36.3 C) (!) 97.5 F (36.4 C) 97.8 F (36.6 C)  TempSrc: Oral  Oral Oral  SpO2: 100% 97% 99% 99%  Weight:   92.3 kg   Height:        Body mass index is 26.13 kg/m.   General exam: Pleasant, elderly Caucasian male.  Not in distress. Skin: No rashes, lesions or ulcers.  HEENT: Atraumatic, normocephalic, no obvious bleeding Lungs: Clear to auscultation bilaterally,  CVS: S1, S2, no murmur,   GI/Abd: Soft, nontender, nondistended, bowel sound present,   CNS: Alert, awake, oriented x 3 Psychiatry: Mood appropriate Extremities: Both feet postsurgical status  Follow ups:    Follow-up Information     Venita Sheffield, MD Follow up.   Specialty: Internal Medicine Contact information: [address] [place] [address] [phone]                 Discharge Instructions:   Discharge Instructions     Call MD for:  difficulty breathing, headache or visual disturbances   Complete by: As directed    Call MD for:  extreme fatigue   Complete by: As directed    Call MD for:  hives   Complete by: As directed    Call MD for:  persistant dizziness or light-headedness   Complete by: As directed    Call MD for:  persistant nausea and vomiting   Complete by: As directed    Call MD for:  severe uncontrolled pain   Complete by: As directed    Call MD for:  temperature >100.4   Complete by: As directed    Diet general   Complete by: As directed    Discharge instructions   Complete by: As directed    Recommendations at discharge:   Complete the course of antibiotics with 7-day course of cephalexin 500 mg 3 times daily.    Allowed for postop shoe WBAT.    Continue cautious use of pain meds as needed.    Follow-up with podiatry as an outpatient.  PDMP reviewed this encounter.   Opioid taper instructions: It is important to wean off of your opioid medication as soon as possible. If you do not need pain medication after your surgery it is ok to stop day one. Opioids include: Codeine, Hydrocodone(Norco, Vicodin), Oxycodone(Percocet, oxycontin) and hydromorphone amongst others.  Long term and even short term use of opiods can  cause: Increased pain response Dependence Constipation Depression Respiratory depression And more.  Withdrawal symptoms can include Flu like symptoms Nausea, vomiting And more Techniques to manage these symptoms Hydrate well Eat regular healthy meals Stay active Use relaxation techniques(deep breathing, meditating, yoga) Do Not substitute Alcohol to help with tapering If you have been on opioids for less than two weeks and do not have pain than it is ok to stop all together.  Plan to wean off of opioids This plan should start within one week post op of your joint replacement. Maintain the same interval or time between taking each dose and first decrease the dose.  Cut the total daily intake of opioids by one tablet each day Next start to increase the time between doses. The last dose that should be eliminated is the evening dose.        General discharge instructions: Follow with Primary MD Venita Sheffield, MD in 7 days  Please request your PCP  to go over your hospital tests, procedures, radiology results at the follow up. Please get your medicines reviewed and adjusted.  Your PCP may decide to repeat certain labs or tests as needed. Do not drive, operate heavy machinery, perform activities at heights, swimming or participation in water activities or provide baby sitting services if your were admitted for syncope or siezures until you have seen by Primary MD or a Neurologist and advised to do so again. [place] Controlled Substance Reporting System database was  reviewed. Do not drive, operate heavy machinery, perform activities at heights, swim, participate in water activities or provide baby-sitting services while on medications for pain, sleep and mood until your outpatient physician has reevaluated you and advised to do so again.  You are strongly recommended to comply with the dose, frequency and duration of prescribed medications. Activity: As tolerated with Full  fall precautions use walker/cane & assistance as needed Avoid using any recreational substances like cigarette, tobacco, alcohol, or non-prescribed drug. If you experience worsening of your admission symptoms, develop shortness of breath, life threatening emergency, suicidal or homicidal thoughts you must seek medical attention immediately by calling 911 or calling your MD immediately  if symptoms less severe. You must read complete instructions/literature along with all the possible adverse reactions/side effects for all the medicines you take and that have been prescribed to you. Take any new medicine only after you have completely understood and accepted all the possible adverse reactions/side effects.  Wear Seat belts while driving. You were cared for by a hospitalist during your hospital stay. If you have any questions about your discharge medications or the care you received while you were in the hospital after you are discharged, you can call the unit and ask to speak with the hospitalist or the covering physician. Once you are discharged, your primary care physician will handle any further medical issues. Please note that NO REFILLS for any discharge medications will be authorized once you are discharged, as it is imperative that you return to your primary care physician (or establish a relationship with a primary care physician if you do not have one).   Discharge wound care:   Complete by: As directed    Increase activity slowly   Complete by: As directed        Discharge Medications:   Allergies as of 08/14/2023       Reactions   Levaquin [levofloxacin] Itching   Tolerated ciprofloxacin in 2022.        Medication List     STOP taking these medications    ondansetron 4 MG tablet Commonly known as: Zofran   oxyCODONE-acetaminophen 5-325 MG tablet Commonly known as: PERCOCET/ROXICET   sulfamethoxazole-trimethoprim 400-80 MG tablet Commonly known as: BACTRIM       TAKE  these medications    albuterol 108 (90 Base) MCG/ACT inhaler Commonly known as: VENTOLIN HFA INHALE 2 PUFFS BY MOUTH EVERY 4 HOURS AS NEEDED FOR WHEEZE OR FOR SHORTNESS OF BREATH   allopurinol 100 MG tablet Commonly known as: ZYLOPRIM Take 100 mg by mouth in the morning.   atorvastatin 20 MG tablet Commonly known as: LIPITOR TAKE 1 TABLET BY MOUTH EVERY DAY   cephALEXin 500 MG capsule Commonly known as: KEFLEX Take 1 capsule (500 mg total) by mouth every 8 (eight) hours for 7 days. Start taking on: August 15, 2023   cyclobenzaprine 10 MG tablet Commonly known as: FLEXERIL Take 10 mg by mouth 3 (three) times daily as needed for muscle spasms.   DULoxetine 30 MG capsule Commonly known as: Cymbalta Take 1 capsule (30 mg total) by mouth daily.   Eliquis 5 MG Tabs tablet Generic drug: apixaban TAKE 1 TABLET BY MOUTH TWICE A DAY   ferrous gluconate 324 MG tablet Commonly known as: FERGON Take 1 tablet (324 mg total) by mouth daily.   furosemide 40 MG tablet Commonly known as: LASIX Take 1 tablet (40 mg total) by mouth daily. What changed:  when to take this reasons to take  this   ipratropium-albuterol 0.5-2.5 (3) MG/3ML Soln Commonly known as: DUONEB 1 NEBULE EVERY 6 HOURS AS NEEDED. **J45.3**   isosorbide mononitrate 30 MG 24 hr tablet Commonly known as: IMDUR TAKE 1 TABLET BY MOUTH EVERY DAY   lactobacillus Pack Take 1 packet (1 g total) by mouth 3 (three) times daily with meals for 7 days.   meclizine 25 MG tablet Commonly known as: ANTIVERT TAKE 1 TABLET BY MOUTH 3 TIMES DAILY AS NEEDED FOR DIZZINESS.   metoprolol succinate 50 MG 24 hr tablet Commonly known as: TOPROL-XL Take 1 tablet (50 mg total) by mouth daily. Take with or immediately following a meal.   ondansetron 8 MG disintegrating tablet Commonly known as: ZOFRAN-ODT Take 1 tablet (8 mg total) by mouth every 8 (eight) hours as needed for nausea.   oxyCODONE 5 MG immediate release tablet Commonly  known as: Oxy IR/ROXICODONE Take 1 tablet (5 mg total) by mouth every 6 (six) hours as needed for up to 5 days for moderate pain (pain score 4-6).   pantoprazole 40 MG tablet Commonly known as: PROTONIX TAKE 1 TABLET (40 MG TOTAL) BY MOUTH TWICE A DAY BEFORE MEALS   predniSONE 10 MG tablet Commonly known as: DELTASONE Take 10 mg by mouth in the morning and at bedtime.   pregabalin 100 MG capsule Commonly known as: Lyrica Take 1 capsule (100 mg total) by mouth 2 (two) times daily.   Striverdi Respimat 2.5 MCG/ACT Aers Generic drug: Olodaterol HCl INHALE 2 PUFFS BY MOUTH INTO THE LUNGS DAILY   tamsulosin 0.4 MG Caps capsule Commonly known as: FLOMAX Take 0.4 mg by mouth at bedtime.               Discharge Care Instructions  (From admission, onward)           Start     Ordered   08/14/23 0000  Discharge wound care:        08/14/23 1102             The results of significant diagnostics from this hospitalization (including imaging, microbiology, ancillary and laboratory) are listed below for reference.    Procedures and Diagnostic Studies:   DG Foot 2 Views Right Result Date: 08/13/2023 CLINICAL DATA:  Osteomyelitis EXAM: RIGHT FOOT - 2 VIEW COMPARISON:  07/03/2023 FINDINGS: Frontal and lateral views of the right foot are obtained. Stable findings of pes planus deformity and Charcot arthropathy with multifocal degenerative changes greatest in the midfoot. Stable postsurgical changes with prior resections of the distal margins of the second through fourth proximal phalanges. Interval amputation of the fifth digit at the level of the metatarsophalangeal joint, with soft tissue swelling at the surgical site. No acute fractures. IMPRESSION: 1. Fifth digit amputation at the level of the metatarsophalangeal joint. 2. Stable degenerative changes as above. Electronically Signed   By: Sharlet Salina M.D.   On: 08/13/2023 14:28   DG Foot 2 Views Left Result Date:  08/13/2023 CLINICAL DATA:  Postop, osteomyelitis EXAM: LEFT FOOT - 2 VIEW COMPARISON:  07/24/2023 FINDINGS: Frontal and lateral views of the left foot are obtained. Interval amputation of the second digit at the level of the metatarsophalangeal joint. Chronic postsurgical changes within the distal aspect third metatarsal. No acute displaced fractures. Soft tissue swelling at the surgical site. 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 IMPRESSION: 1. Postsurgical changes from AP taken of the second digit at the level of the metatarsophalangeal joint. 2. Stable degenerative changes within the midfoot and  hindfoot. Electronically Signed   By: Sharlet Salina M.D.   On: 08/13/2023 14:26     Labs:   Basic Metabolic Panel: Recent Labs  Lab 08/12/23 1348 08/13/23 0527 08/14/23 0612  NA 139 141 138  K 3.3* 3.4* 3.6  CL 110 111 111  CO2 25 25 23   GLUCOSE 91 119* 91  BUN 23 24* 20  CREATININE 0.91 0.71 0.71  CALCIUM 9.1 8.4* 8.6*  MG  --  2.1  --    GFR Estimated Creatinine Clearance: 91.3 mL/min (by C-G formula based on SCr of 0.71 mg/dL). Liver Function Tests: Recent Labs  Lab 08/12/23 1348  AST 20  ALT 19  ALKPHOS 65  BILITOT 0.8  PROT 6.0*  ALBUMIN 3.6   No results for input(s): "LIPASE", "AMYLASE" in the last 168 hours. No results for input(s): "AMMONIA" in the last 168 hours. Coagulation profile No results for input(s): "INR", "PROTIME" in the last 168 hours.  CBC: Recent Labs  Lab 08/12/23 1348 08/13/23 0527 08/14/23 0612  WBC 9.1 6.0 6.3  NEUTROABS 6.4  --  4.7  HGB 12.4* 11.5* 11.5*  HCT 39.3 36.3* 36.8*  MCV 95.4 95.0 96.6  PLT 180 150 148*   Cardiac Enzymes: No results for input(s): "CKTOTAL", "CKMB", "CKMBINDEX", "TROPONINI" in the last 168 hours. BNP: Invalid input(s): "POCBNP" CBG: No results for input(s): "GLUCAP" in the last 168 hours. D-Dimer No results for input(s): "DDIMER" in the last 72 hours. Hgb A1c No results for input(s): "HGBA1C"  in the last 72 hours. Lipid Profile No results for input(s): "CHOL", "HDL", "LDLCALC", "TRIG", "CHOLHDL", "LDLDIRECT" in the last 72 hours. Thyroid function studies No results for input(s): "TSH", "T4TOTAL", "T3FREE", "THYROIDAB" in the last 72 hours.  Invalid input(s): "FREET3" Anemia work up No results for input(s): "VITAMINB12", "FOLATE", "FERRITIN", "TIBC", "IRON", "RETICCTPCT" in the last 72 hours. Microbiology Recent Results (from the past 240 hours)  WOUND CULTURE     Status: None   Collection Time: 08/07/23  8:40 AM  Result Value Ref Range Status   MICRO NUMBER: [id number]  Final   SPECIMEN QUALITY: Adequate  Final   SOURCE: WOUND  Final   STATUS: FINAL  Final   GRAM STAIN:   Final    No white blood cells seen No epithelial cells seen Moderate Gram positive cocci in pairs   RESULT:   Final    Growth of skin flora (note: Growth does not include S. aureus, beta-hemolytic Streptococci or P. aeruginosa).  Blood culture (routine x 2)     Status: None (Preliminary result)   Collection Time: 08/12/23  6:59 PM   Specimen: BLOOD  Result Value Ref Range Status   Specimen Description BLOOD LEFT ANTECUBITAL  Final   Special Requests   Final    BOTTLES DRAWN AEROBIC AND ANAEROBIC Blood Culture adequate volume   Culture   Final    NO GROWTH 2 DAYS Performed at [facility], [address] [place], [place] [address]    Report Status PENDING  Incomplete  Blood culture (routine x 2)     Status: None (Preliminary result)   Collection Time: 08/12/23  7:33 PM   Specimen: BLOOD LEFT ARM  Result Value Ref Range Status   Specimen Description BLOOD LEFT ARM  Final   Special Requests   Final    BOTTLES DRAWN AEROBIC AND ANAEROBIC Blood Culture adequate volume   Culture   Final    NO GROWTH 2 DAYS Performed at [facility]  Lab, [address], [place], [place] [address]    Report Status PENDING  Incomplete  MRSA Next Gen by PCR, Nasal     Status: None   Collection Time: 08/12/23   8:56 PM   Specimen: Nasal Mucosa; Nasal Swab  Result Value Ref Range Status   MRSA by PCR Next Gen NOT DETECTED NOT DETECTED Final    Comment: (NOTE) The GeneXpert MRSA Assay (FDA approved for NASAL specimens only), is one component of a comprehensive MRSA colonization surveillance program. It is not intended to diagnose MRSA infection nor to guide or monitor treatment for MRSA infections. Test performance is not FDA approved in patients less than 79 years old. Performed at [facility], [address], [place], [place] [address]   Aerobic/Anaerobic Culture w Gram Stain (surgical/deep wound)     Status: None (Preliminary result)   Collection Time: 08/13/23 10:00 AM   Specimen: Bone; Tissue  Result Value Ref Range Status   Specimen Description BONE  Final   Special Requests NONE  Final   Gram Stain NO WBC SEEN NO ORGANISMS SEEN   Final   Culture   Final    RARE STAPHYLOCOCCUS EPIDERMIDIS CRITICAL RESULT CALLED TO, READ BACK BY AND VERIFIED WITH: RN Gershon Cull [id number] FCP Performed at [facility], [address], [place], [place] [address]    Report Status PENDING  Incomplete    Time coordinating discharge: 45 minutes  Signed: Odell Choung  [facility] 08/14/2023, 11:32 AM

## 2023-08-15 ENCOUNTER — Other Ambulatory Visit: Payer: Self-pay | Admitting: Podiatry

## 2023-08-15 ENCOUNTER — Telehealth: Payer: Self-pay

## 2023-08-15 DIAGNOSIS — L97524 Non-pressure chronic ulcer of other part of left foot with necrosis of bone: Secondary | ICD-10-CM

## 2023-08-15 DIAGNOSIS — G6289 Other specified polyneuropathies: Secondary | ICD-10-CM

## 2023-08-15 LAB — FUNGUS STAIN

## 2023-08-15 LAB — FUNGAL STAIN REFLEX

## 2023-08-15 NOTE — Progress Notes (Signed)
Referral for home health placed. 

## 2023-08-15 NOTE — Transitions of Care (Post Inpatient/ED Visit) (Signed)
   08/15/2023  Name: Levi [name] MRN: [id number] DOB: [birth year]  Today's TOC FU Call Status: Today's TOC FU Call Status:: Unsuccessful Call (1st Attempt) Unsuccessful Call (1st Attempt) Date: 08/15/23  Attempted to reach the patient regarding the most recent Inpatient/ED visit.  Follow Up Plan: Additional outreach attempts will be made to reach the patient to complete the Transitions of Care (Post Inpatient/ED visit) call.   Hilbert Odor RN, CCM [place]  VBCI-Population Health RN Care Manager [phone]

## 2023-08-17 LAB — CULTURE, BLOOD (ROUTINE X 2)
Culture: NO GROWTH
Culture: NO GROWTH
Special Requests: ADEQUATE
Special Requests: ADEQUATE

## 2023-08-18 ENCOUNTER — Telehealth: Payer: Self-pay

## 2023-08-18 LAB — AEROBIC/ANAEROBIC CULTURE W GRAM STAIN (SURGICAL/DEEP WOUND): Gram Stain: NONE SEEN

## 2023-08-18 NOTE — Transitions of Care (Post Inpatient/ED Visit) (Signed)
   08/18/2023  Name: Levi [name] MRN: [id number] DOB: 02/05/1946  Today's TOC FU Call Status: Patient declined to finish TOC call -unable to complete entire assessment.   Patient's Name and Date of Birth confirmed.  Transition Care Management Follow-up Telephone Call Date of Discharge: 08/18/23 How have you been since you were released from the hospital?: Better Any questions or concerns?: No (patient states I've been in the hospital so many times and don't think I need the call)  Items Reviewed: Did you receive and understand the discharge instructions provided?:  (patient declined to finish TOC call -unable to review) Medications obtained,verified, and reconciled?:  (patient declined to finish TOC call -unable to review)  Medications Reviewed Today: patient declined to finish TOC call -unable to review     Follow up appointments reviewed: PCP Follow-up appointment confirmed?: No (Patient declined to allow [facility] RN to call and schedule and states he will call himself) MD Provider Line Number:[phone] Given: No Specialist Hospital Follow-up appointment confirmed?: Yes Date of Specialist follow-up appointment?: 08/21/23 Follow-Up Specialty Provider:: Carlena Hurl Do you need transportation to your follow-up appointment?: No (Patient states he has transportation to appointments) Do you understand care options if your condition(s) worsen?: Yes-patient verbalized understanding    Hilbert Odor RN, CCM [place]  VBCI-Population Health RN Care Manager [phone]

## 2023-08-21 ENCOUNTER — Other Ambulatory Visit: Payer: Self-pay | Admitting: Internal Medicine

## 2023-08-21 ENCOUNTER — Encounter: Admitting: Podiatry

## 2023-08-21 ENCOUNTER — Ambulatory Visit (INDEPENDENT_AMBULATORY_CARE_PROVIDER_SITE_OTHER): Admitting: Podiatry

## 2023-08-21 ENCOUNTER — Encounter: Payer: Self-pay | Admitting: Podiatry

## 2023-08-21 VITALS — Ht 74.0 in | Wt 203.5 lb

## 2023-08-21 DIAGNOSIS — Z89421 Acquired absence of other right toe(s): Secondary | ICD-10-CM | POA: Diagnosis not present

## 2023-08-21 DIAGNOSIS — Z89422 Acquired absence of other left toe(s): Secondary | ICD-10-CM | POA: Diagnosis not present

## 2023-08-21 DIAGNOSIS — G6289 Other specified polyneuropathies: Secondary | ICD-10-CM | POA: Diagnosis not present

## 2023-08-21 NOTE — Progress Notes (Signed)
  Subjective:  Patient ID: Levi [name], male    DOB: 1946/03/09,  MRN: [id number]   DOS: 08/13/2023 Procedure: 1. LEFT second toe amputation at MPJ level 2. RIGHT fifth toe amputation at MPJ level  78 y.o. male seen for post op check.  Patient reports he is doing well since going home he has been taking antibiotics as directed has a few left.  Has been walking in postop shoe to both feet, left the dressing is clean dry and intact as instructed.  Denies pain at amputation sites.  Does have neuropathic pain  Review of Systems: Negative except as noted in the HPI. Denies N/V/F/Ch.   Objective:   There were no vitals filed for this visit.  Body mass index is 26.13 kg/m. Constitutional Well developed. Well nourished.  Vascular Foot warm and well perfused. Capillary refill normal to all digits.   No calf pain with palpation  Neurologic Normal speech. Oriented to person, place, and time. Epicritic sensation diminished to bilateral foot  Dermatologic Amputation site to second toe left foot and fifth toe right foot healing well with no dehiscence no erythema no edema or drainage      Orthopedic: Status post left second toe amputation and right fifth toe amputation   Radiographs: Disarticulation of left second toe and right fifth toe at MPJ level  Pathology:  A. TOE, RIGHT FIFTH, AMPUTATION:  - Benign bone with chronic inflammation  - Benign skin with acute and chronic inflammation and ulceration  - No malignancy identified   B. TOE, LEFT SECOND, AMPUTATION:  - Benign bone with chronic inflammation  - Benign skin with acute and chronic inflammation and ulceration  - No malignancy identified   Micro: Multiple orgs identified none predominant  Assessment:   Osteomyelitis of left second and right fifth toe status post amputation  Plan:  Patient was evaluated and treated and all questions answered.  8 days  s/p left second toe amputation at MPJ level and right fifth toe  amputation MPJ level -Progressing as expected postoperatively, amputation sites healing without any issue -XR: Expected postop changes -WB Status: Weightbearing as tolerated in postop shoe bilateral foot -Sutures: Remain intact. -Medications/ABX: Status post course of cephalexin no further antibiotics indicated -Dressing to remain clean dry and intact until follow-up next   -Follow-up in 1 week        Corinna Gab, DPM [facility] / [facility]

## 2023-08-22 ENCOUNTER — Other Ambulatory Visit: Payer: Self-pay | Admitting: Sports Medicine

## 2023-08-22 DIAGNOSIS — R600 Localized edema: Secondary | ICD-10-CM | POA: Diagnosis not present

## 2023-08-22 DIAGNOSIS — I83029 Varicose veins of left lower extremity with ulcer of unspecified site: Secondary | ICD-10-CM | POA: Diagnosis not present

## 2023-08-22 DIAGNOSIS — M7989 Other specified soft tissue disorders: Secondary | ICD-10-CM

## 2023-08-22 DIAGNOSIS — S81802A Unspecified open wound, left lower leg, initial encounter: Secondary | ICD-10-CM | POA: Diagnosis not present

## 2023-08-22 DIAGNOSIS — I872 Venous insufficiency (chronic) (peripheral): Secondary | ICD-10-CM | POA: Diagnosis not present

## 2023-08-22 DIAGNOSIS — T8133XD Disruption of traumatic injury wound repair, subsequent encounter: Secondary | ICD-10-CM | POA: Diagnosis not present

## 2023-08-22 DIAGNOSIS — L97929 Non-pressure chronic ulcer of unspecified part of left lower leg with unspecified severity: Secondary | ICD-10-CM | POA: Diagnosis not present

## 2023-08-25 ENCOUNTER — Telehealth: Payer: Self-pay | Admitting: Cardiology

## 2023-08-25 MED ORDER — DILTIAZEM HCL ER COATED BEADS 180 MG PO CP24: 180.0000 mg | ORAL_CAPSULE | Freq: Every day | ORAL | 3 refills | Status: AC

## 2023-08-25 NOTE — Telephone Encounter (Signed)
 Patient states his PCP stopped his diltiazem and started him on metoprolol. He would like to go back to diltiazem. He feels the metoprolol his causing him to be little SOB. He would like to know if you can send RX for diltiazem. He no longer see the PCP

## 2023-08-25 NOTE — Telephone Encounter (Signed)
 RX for Diltiazem 180 sent into pharmacy as requested. Metoprolol discontinued. Pt is aware

## 2023-08-25 NOTE — Telephone Encounter (Signed)
 Pt c/o medication issue:  1. Name of Medication:   2. How are you currently taking this medication (dosage and times per day)? No  3. Are you having a reaction (difficulty breathing--STAT)? No  4. What is your medication issue? Pt would like to stop taking metoprolol succinate (TOPROL-XL) 50 MG 24 hr tablet and would like to start taking diltiazem (CARDIZEM CD) 180 MG 24 hr capsule. Please advise

## 2023-08-28 ENCOUNTER — Ambulatory Visit (INDEPENDENT_AMBULATORY_CARE_PROVIDER_SITE_OTHER): Admitting: Podiatry

## 2023-08-28 DIAGNOSIS — Z89421 Acquired absence of other right toe(s): Secondary | ICD-10-CM | POA: Diagnosis not present

## 2023-08-28 DIAGNOSIS — Z89422 Acquired absence of other left toe(s): Secondary | ICD-10-CM

## 2023-08-28 NOTE — Progress Notes (Signed)
  Subjective:  Patient ID: Levi [name], male    DOB: 03-25-1946,  MRN: [id number]   DOS: 08/13/2023 Procedure: 1. LEFT second toe amputation at MPJ level 2. RIGHT fifth toe amputation at MPJ level  78 y.o. male seen for post op check.  2 weeks postop at this point   has been walking in postop shoe to both feet, left the dressing is clean dry and intact as instructed.  Denies pain at amputation sites.  Does have neuropathic pain  Review of Systems: Negative except as noted in the HPI. Denies N/V/F/Ch.   Objective:   There were no vitals filed for this visit.  There is no height or weight on file to calculate BMI. Constitutional Well developed. Well nourished.  Vascular Foot warm and well perfused. Capillary refill normal to all digits.   No calf pain with palpation  Neurologic Normal speech. Oriented to person, place, and time. Epicritic sensation diminished to bilateral foot  Dermatologic Amputation site to second toe left foot and fifth toe right foot healing well with no dehiscence no erythema no edema or drainage.  Improved healing from prior mild eschar but no maceration at the amputation line    Orthopedic: Status post left second toe amputation and right fifth toe amputation   Radiographs: Disarticulation of left second toe and right fifth toe at MPJ level  Pathology:  A. TOE, RIGHT FIFTH, AMPUTATION:  - Benign bone with chronic inflammation  - Benign skin with acute and chronic inflammation and ulceration  - No malignancy identified   B. TOE, LEFT SECOND, AMPUTATION:  - Benign bone with chronic inflammation  - Benign skin with acute and chronic inflammation and ulceration  - No malignancy identified   Micro: Multiple orgs identified none predominant  Assessment:   Osteomyelitis of left second and right fifth toe status post amputation  Plan:  Patient was evaluated and treated and all questions answered.  2 weeks s/p left second toe amputation at MPJ level  and right fifth toe amputation MPJ level -Progressing as expected postoperatively, amputation sites healing without any issue -XR: Expected postop changes -WB Status: Weightbearing as tolerated in postop shoe bilateral foot -Sutures: Removed in total at this appointment -Medications/ABX: Status post course of cephalexin no further antibiotics indicated -Patient is now okay to get the foot wet and wash and shower but should dry the surgical sites thoroughly after and apply Band-Aid style dressing until fully healed -Follow-up in 3 weeks for final recheck        Corinna Gab, DPM [facility] / [facility]

## 2023-09-02 ENCOUNTER — Other Ambulatory Visit: Payer: Self-pay | Admitting: Cardiology

## 2023-09-02 ENCOUNTER — Other Ambulatory Visit: Payer: Self-pay | Admitting: Sports Medicine

## 2023-09-02 NOTE — Telephone Encounter (Signed)
 [facility] requesting med refill for allopurinol. Written by historical provided.

## 2023-09-03 ENCOUNTER — Other Ambulatory Visit: Payer: Self-pay | Admitting: Sports Medicine

## 2023-09-03 DIAGNOSIS — M48062 Spinal stenosis, lumbar region with neurogenic claudication: Secondary | ICD-10-CM

## 2023-09-04 ENCOUNTER — Encounter: Admitting: Podiatry

## 2023-09-04 ENCOUNTER — Ambulatory Visit (INDEPENDENT_AMBULATORY_CARE_PROVIDER_SITE_OTHER): Admitting: Sports Medicine

## 2023-09-04 DIAGNOSIS — M48062 Spinal stenosis, lumbar region with neurogenic claudication: Secondary | ICD-10-CM

## 2023-09-04 DIAGNOSIS — T148XXA Other injury of unspecified body region, initial encounter: Secondary | ICD-10-CM | POA: Diagnosis not present

## 2023-09-04 DIAGNOSIS — M1811 Unilateral primary osteoarthritis of first carpometacarpal joint, right hand: Secondary | ICD-10-CM | POA: Diagnosis not present

## 2023-09-04 MED ORDER — TRAMADOL HCL 50 MG PO TABS: 50.0000 mg | ORAL_TABLET | Freq: Three times a day (TID) | ORAL | 0 refills | Status: DC | PRN

## 2023-09-04 MED ORDER — CYCLOBENZAPRINE HCL 10 MG PO TABS: 10.0000 mg | ORAL_TABLET | Freq: Three times a day (TID) | ORAL | 3 refills | Status: DC | PRN

## 2023-09-04 NOTE — Assessment & Plan Note (Signed)
 Levi [name] is again complaining of discomfort bilateral posterior hips and back. He gets some burning when walking. He has known spinal stenosis with an extensive fusion from T12-S1. No obvious areas of central canal stenosis on his MRI about 6 months ago. We will add some tramadol for his pain, he will continue some home therapy, refilling Flexeril, return to see me as needed for this.

## 2023-09-04 NOTE — Progress Notes (Signed)
    Procedures performed today:    None.  Independent interpretation of notes and tests performed by another provider:   None.  Brief History, Exam, Impression, and Recommendations:    Blood blister Blood blister right posterior calf, negative Homans' sign, feels somewhat congealed. No surrounding erythema, induration or tenderness. I have suggested continuation of compression stockings which she is not wearing on the right side. No further evaluation needed for this.  Arthritis of carpometacarpal [facility]) joint of right thumb Widespread hand osteoarthritis, he did have a nerve conduction and EMG in 2021 that showed evidence of radiculopathy as well as axonal polyneuropathy. Complaint today is more inability to fully open his hand. On exam he has full passive motion, he does have some weakness to actively extending his fingers. I have suggested either living with this or proceeding with aggressive occupational hand therapy, he is going to think about it for now.  Lumbar stenosis status post T12-S1 fusion Levi [name] is again complaining of discomfort bilateral posterior hips and back. He gets some burning when walking. He has known spinal stenosis with an extensive fusion from T12-S1. No obvious areas of central canal stenosis on his MRI about 6 months ago. We will add some tramadol for his pain, he will continue some home therapy, refilling Flexeril, return to see me as needed for this.    ____________________________________________ [name]. [name], M.D., ABFM., CAQSM., AME. Primary Care and Sports Medicine [facility]  Adjunct Professor of Family Medicine  [employer]  Restaurant manager, fast food

## 2023-09-04 NOTE — Assessment & Plan Note (Signed)
 Widespread hand osteoarthritis, he did have a nerve conduction and EMG in 2021 that showed evidence of radiculopathy as well as axonal polyneuropathy. Complaint today is more inability to fully open his hand. On exam he has full passive motion, he does have some weakness to actively extending his fingers. I have suggested either living with this or proceeding with aggressive occupational hand therapy, he is going to think about it for now.

## 2023-09-04 NOTE — Assessment & Plan Note (Signed)
 Blood blister right posterior calf, negative Homans' sign, feels somewhat congealed. No surrounding erythema, induration or tenderness. I have suggested continuation of compression stockings which she is not wearing on the right side. No further evaluation needed for this.

## 2023-09-05 DIAGNOSIS — T8133XA Disruption of traumatic injury wound repair, initial encounter: Secondary | ICD-10-CM | POA: Diagnosis not present

## 2023-09-05 DIAGNOSIS — T8133XD Disruption of traumatic injury wound repair, subsequent encounter: Secondary | ICD-10-CM | POA: Diagnosis not present

## 2023-09-05 DIAGNOSIS — L97919 Non-pressure chronic ulcer of unspecified part of right lower leg with unspecified severity: Secondary | ICD-10-CM | POA: Diagnosis not present

## 2023-09-05 DIAGNOSIS — I872 Venous insufficiency (chronic) (peripheral): Secondary | ICD-10-CM | POA: Diagnosis not present

## 2023-09-05 DIAGNOSIS — I83019 Varicose veins of right lower extremity with ulcer of unspecified site: Secondary | ICD-10-CM | POA: Diagnosis not present

## 2023-09-05 DIAGNOSIS — R600 Localized edema: Secondary | ICD-10-CM | POA: Diagnosis not present

## 2023-09-09 ENCOUNTER — Telehealth: Payer: Self-pay

## 2023-09-09 ENCOUNTER — Ambulatory Visit (INDEPENDENT_AMBULATORY_CARE_PROVIDER_SITE_OTHER)

## 2023-09-09 ENCOUNTER — Ambulatory Visit (INDEPENDENT_AMBULATORY_CARE_PROVIDER_SITE_OTHER): Admitting: Podiatry

## 2023-09-09 ENCOUNTER — Encounter: Payer: Self-pay | Admitting: Podiatry

## 2023-09-09 DIAGNOSIS — L97524 Non-pressure chronic ulcer of other part of left foot with necrosis of bone: Secondary | ICD-10-CM

## 2023-09-09 DIAGNOSIS — S90412A Abrasion, left great toe, initial encounter: Secondary | ICD-10-CM

## 2023-09-09 NOTE — Telephone Encounter (Signed)
 Patient needs appointment asap - he has recently had toes amputated and now he has toes on the left that look the same as the bad toes before amputation - OK to schedule with on call or any surgeon

## 2023-09-09 NOTE — Patient Instructions (Signed)
 More silicone pads can be purchased from:  [email or web address]

## 2023-09-09 NOTE — Progress Notes (Signed)
  Subjective:  Patient ID: Levi [name], male    DOB: [birth year],  MRN: [id number]  No chief complaint on file.   78 y.o. male presents with the above complaint. History confirmed with patient.  Recently underwent left second toe amputation, has a new spot rubbing on the left great toe his wife thinks maybe is a certain pair of shoes.  Objective:  Physical Exam: Left second toe amputation site is well-healed he has an erythematous area on the IPJ of the hallux with mild hallux malleus deformity, abnormal sensation with diffuse peripheral neuropathy palpable pulses.  Radiographs: Multiple views x-ray of the left foot: no soft tissue emphysema, no signs of osteomyelitis Assessment:   1. Abrasion, left great toe, initial encounter      Plan:  Patient was evaluated and treated and all questions answered.  Discussed he likely is getting contracture due to loss of the second toe with hallux malleus and slight hallux valgus and having abrasions from shoe gear.  We discussed the importance of finding shoes that are comfortable and have plenty of room for the toe.  I also recommend offloading a silicone pad which I dispensed.  Advised to watch closely for signs or symptoms of ulceration which there are none currently.  Follow-up as scheduled with Dr. Rosemarie Conquest.  Return if symptoms worsen or fail to improve.

## 2023-09-12 LAB — FUNGUS CULTURE WITH STAIN

## 2023-09-12 LAB — FUNGUS CULTURE RESULT

## 2023-09-12 LAB — FUNGAL ORGANISM REFLEX

## 2023-09-16 ENCOUNTER — Ambulatory Visit: Payer: Medicare Other | Admitting: Sports Medicine

## 2023-09-19 DIAGNOSIS — S81801D Unspecified open wound, right lower leg, subsequent encounter: Secondary | ICD-10-CM | POA: Diagnosis not present

## 2023-09-19 DIAGNOSIS — I872 Venous insufficiency (chronic) (peripheral): Secondary | ICD-10-CM | POA: Diagnosis not present

## 2023-09-19 DIAGNOSIS — S41112A Laceration without foreign body of left upper arm, initial encounter: Secondary | ICD-10-CM | POA: Diagnosis not present

## 2023-09-19 DIAGNOSIS — R600 Localized edema: Secondary | ICD-10-CM | POA: Diagnosis not present

## 2023-09-19 DIAGNOSIS — S81801A Unspecified open wound, right lower leg, initial encounter: Secondary | ICD-10-CM | POA: Diagnosis not present

## 2023-09-23 ENCOUNTER — Encounter: Payer: Self-pay | Admitting: Podiatry

## 2023-09-23 ENCOUNTER — Ambulatory Visit (INDEPENDENT_AMBULATORY_CARE_PROVIDER_SITE_OTHER): Admitting: Podiatry

## 2023-09-23 DIAGNOSIS — Z89421 Acquired absence of other right toe(s): Secondary | ICD-10-CM

## 2023-09-23 DIAGNOSIS — Z89422 Acquired absence of other left toe(s): Secondary | ICD-10-CM

## 2023-09-23 DIAGNOSIS — M14671 Charcot's joint, right ankle and foot: Secondary | ICD-10-CM

## 2023-09-23 NOTE — Progress Notes (Signed)
  Subjective:  Patient ID: Levi [name], male    DOB: [birth year],  MRN: [id number]   DOS: 08/13/2023 Procedure: 1. LEFT second toe amputation at MPJ level 2. RIGHT fifth toe amputation at MPJ level  78 y.o. male seen for post op check.  6 weeks postop at this point   has been walking in postop shoe to both feet.  He reports he has been doing well he does have a lot of pain regarding his Charcot deformity on the right.  Thinks the amputation sites are improving.  Concerns with regards to either 1  Review of Systems: Negative except as noted in the HPI. Denies N/V/F/Ch.   Objective:   There were no vitals filed for this visit.  There is no height or weight on file to calculate BMI. Constitutional Well developed. Well nourished.  Vascular Foot warm and well perfused. Capillary refill normal to all digits.   No calf pain with palpation  Neurologic Normal speech. Oriented to person, place, and time. Epicritic sensation diminished to bilateral foot  Dermatologic Amputation site to second toe left foot and fifth toe right foot much improved from prior.  Left second toe amp site has a very superficial tiny opening nearly fully healed infection.  Right fifth toe is fully healed at the amp site.    Orthopedic: Status post left second toe amputation and right fifth toe amputation   Radiographs: Disarticulation of left second toe and right fifth toe at MPJ level  Pathology:  A. TOE, RIGHT FIFTH, AMPUTATION:  - Benign bone with chronic inflammation  - Benign skin with acute and chronic inflammation and ulceration  - No malignancy identified   B. TOE, LEFT SECOND, AMPUTATION:  - Benign bone with chronic inflammation  - Benign skin with acute and chronic inflammation and ulceration  - No malignancy identified   Micro: Multiple orgs identified none predominant  Assessment:   Osteomyelitis of left second and right fifth toe status post amputation  Plan:  Patient was evaluated and  treated and all questions answered.  6 weeks s/p left second toe amputation at MPJ level and right fifth toe amputation MPJ level -Progressing as expected postoperatively, amputation site right fifth toe fully healed and left second toe nearly fully healed -XR: Expected postop changes -WB Status: Weightbearing as tolerated in postop shoe bilateral foot -Medications/ABX:  - Continue Band-Aid and antibiotic ointment to left side this should likely be another week to 2 weeks -Follow-up in 3 with Dr. Alvah Auerbach for diabetic footcare        Maridee Shoemaker, DPM [facility] / [facility]

## 2023-09-25 ENCOUNTER — Encounter: Payer: Self-pay | Admitting: Sports Medicine

## 2023-09-25 DIAGNOSIS — M14671 Charcot's joint, right ankle and foot: Secondary | ICD-10-CM

## 2023-09-25 MED ORDER — TRAMADOL HCL 50 MG PO TABS: 50.0000 mg | ORAL_TABLET | Freq: Two times a day (BID) | ORAL | 3 refills | Status: DC | PRN

## 2023-09-25 NOTE — Telephone Encounter (Signed)
 No Tramadol  50mg  showing inpatient current med list Last OV 09/04/2023 Upcoming appt 10/23/2023- AWV

## 2023-09-26 LAB — ACID FAST CULTURE WITH REFLEXED SENSITIVITIES (MYCOBACTERIA): Acid Fast Culture: NEGATIVE

## 2023-10-02 ENCOUNTER — Ambulatory Visit: Payer: Medicare Other | Admitting: Podiatry

## 2023-10-03 ENCOUNTER — Ambulatory Visit (INDEPENDENT_AMBULATORY_CARE_PROVIDER_SITE_OTHER): Admitting: Podiatry

## 2023-10-03 ENCOUNTER — Encounter: Payer: Self-pay | Admitting: Podiatry

## 2023-10-03 DIAGNOSIS — L97512 Non-pressure chronic ulcer of other part of right foot with fat layer exposed: Secondary | ICD-10-CM

## 2023-10-03 DIAGNOSIS — M14671 Charcot's joint, right ankle and foot: Secondary | ICD-10-CM

## 2023-10-03 NOTE — Progress Notes (Signed)
 Subjective:  Patient ID: Levi [name], male    DOB: April 17, 1946,   MRN: [id number]  Chief Complaint  Patient presents with   Foot Ulcer    Pt presents for a follow up of  a wound on the bottom of right foot states he is in so much pain     78 y.o. male presents for new concern of wound on the bottom of his right foot. Relates they notices it a few days ago. He has been seen in wound care for this as well but wanted it checked out here. He relates pain in the foot. History of charcot arthropathy  . Denies any other pedal complaints. Denies n/v/f/c.   Past Medical History:  Diagnosis Date   Agent orange exposure 1970's   takes Imdur ,Diltiazem , and Atacand  daily   Agent orange exposure    ALLERGIC RHINITIS    Allergy     seasonal allergies   Anemia    iron  deficiency   Asthma    uses inhaler and nebulizers   Atrial fibrillation (HCC)    Bruises easily    d/t prednisone  daily   Cataract    Cellulitis 03/22/2020   Chest wall pain 09/21/2019   L scapula, reported 4/27 21/     CHF (congestive heart failure) (HCC)    dx-on meds to treat   Chronic bronchitis (HCC)    "used to get it q yr; last time ?2008" (04/28/2012)   Clotting disorder (HCC) 2019   On blood thinner when I bleed it is for a consideral amount of time. This is tegardless of size of wound.   COPD (chronic obstructive pulmonary disease) (HCC)    agent orange exposure   Degenerative disk disease    "everywhere" (04/28/2012)   Diverticulitis    Dysrhythmia    afib   Elevated uric acid in blood    takes Allopurinol  daily   Emphysema of lung (HCC)    Enlarged prostate    but not on any meds   GERD (gastroesophageal reflux disease)    takes Protonix  daily   H/O hiatal hernia    Headache    History of colonoscopy    History of pneumonia    a. 2010   Hyperlipidemia    takes Lipitor daily; pt. states he takes a preventive   Hypothyroidism    Joint pain    Joint swelling    Leukocytosis    MSSA bacteremia  03/19/2023   Nausea and vomiting 11/12/2022   Neuromuscular disorder (HCC)    bilat legs, and bilat arms   Neuropathy    Osteoporosis 2012   PAF (paroxysmal atrial fibrillation) (HCC)    a. Dx 04/2012, CHA2DS2VASc = 1 (age);  b. 04/2012 Echo: EF 40-50%, mild MR.   Personal history of colonic adenomas 02/09/2013   Pneumonia    Sepsis (HCC) 03/18/2023   Shortness of breath dyspnea    Thyroid  disease    no taking meds-caused dizziness    Objective:  Physical Exam: Vascular: DP/PT pulses 2/4 bilateral. CFT <3 seconds. Normal hair growth on digits. No edema.  Skin. No lacerations or abrasions bilateral feet. Ulceration noted plantar right midfoot with hyperkeratosis surrounding and granular base. No probe to bone. Mild erythema surrounding. No edema or streaking noted.  Musculoskeletal: MMT 5/5 bilateral lower extremities in DF, PF, Inversion and Eversion. Deceased ROM in DF of ankle joint. Rocker bottom deformity noted to right foot  Neurological: Sensation intact to light touch.   Assessment:   1.  Ulcer of right foot with fat layer exposed (HCC)   2. Charcot's joint of foot, right      Plan:  Patient was evaluated and treated and all questions answered. Ulcer plantar right midfoot with fat layer exposed  -Debridement as below. -Dressed with betadine , DSD. -Off-loading with CAM boot dispensed. . -Doxycycline  sent to pharmacy as precaution -Discussed glucose control and proper protein-rich diet.  -Discussed if any worsening redness, pain, fever or chills to call or may need to report to the emergency room. Patient expressed understanding.   Procedure: Excisional Debridement of Wound Rationale: Removal of non-viable soft tissue from the wound to promote healing.  Anesthesia: none Pre-Debridement Wound Measurements: Overlying callus  Post-Debridement Wound Measurements: 1 cm x 0.5 cm x 0.4 cm  Type of Debridement: Sharp Excisional Tissue Removed: Non-viable soft tissue Depth  of Debridement: subcutaneous tissue. Technique: Sharp excisional debridement to bleeding, viable wound base.  Dressing: Dry, sterile, compression dressing. Disposition: Patient tolerated procedure well. Patient to return in 3 week for follow-up.  Return in about 3 weeks (around 10/24/2023) for wound check.   Jennefer Moats, DPM

## 2023-10-06 ENCOUNTER — Telehealth: Payer: Self-pay | Admitting: Urology

## 2023-10-06 ENCOUNTER — Other Ambulatory Visit: Payer: Self-pay | Admitting: Podiatry

## 2023-10-06 MED ORDER — DOXYCYCLINE HYCLATE 100 MG PO TABS: 100.0000 mg | ORAL_TABLET | Freq: Two times a day (BID) | ORAL | 0 refills | Status: AC

## 2023-10-06 NOTE — Telephone Encounter (Signed)
 [facility] [place] Pt states there was no abx sent to his pharmacy Friday when he saw you. Please Advise thank you

## 2023-10-08 ENCOUNTER — Emergency Department (HOSPITAL_BASED_OUTPATIENT_CLINIC_OR_DEPARTMENT_OTHER)
Admission: RE | Admit: 2023-10-08 | Discharge: 2023-10-08 | Disposition: A | Discharge status: Home / Self Care | Source: Ambulatory Visit | Attending: Sports Medicine | Admitting: Sports Medicine

## 2023-10-08 ENCOUNTER — Encounter: Payer: Self-pay | Admitting: Sports Medicine

## 2023-10-08 ENCOUNTER — Telehealth: Payer: Self-pay | Admitting: Podiatry

## 2023-10-08 ENCOUNTER — Encounter (HOSPITAL_BASED_OUTPATIENT_CLINIC_OR_DEPARTMENT_OTHER): Payer: Self-pay

## 2023-10-08 ENCOUNTER — Encounter: Payer: Self-pay | Admitting: Emergency Medicine

## 2023-10-08 ENCOUNTER — Ambulatory Visit
Admission: EM | Admit: 2023-10-08 | Discharge: 2023-10-08 | Disposition: A | Discharge status: Other Acute Inpatient Hospital | Attending: Family Medicine | Admitting: Family Medicine

## 2023-10-08 DIAGNOSIS — Z7984 Long term (current) use of oral hypoglycemic drugs: Secondary | ICD-10-CM | POA: Diagnosis not present

## 2023-10-08 DIAGNOSIS — L089 Local infection of the skin and subcutaneous tissue, unspecified: Secondary | ICD-10-CM | POA: Diagnosis not present

## 2023-10-08 DIAGNOSIS — L97512 Non-pressure chronic ulcer of other part of right foot with fat layer exposed: Secondary | ICD-10-CM

## 2023-10-08 DIAGNOSIS — L97402 Non-pressure chronic ulcer of unspecified heel and midfoot with fat layer exposed: Secondary | ICD-10-CM

## 2023-10-08 DIAGNOSIS — M14671 Charcot's joint, right ankle and foot: Secondary | ICD-10-CM

## 2023-10-08 DIAGNOSIS — L97519 Non-pressure chronic ulcer of other part of right foot with unspecified severity: Secondary | ICD-10-CM | POA: Diagnosis not present

## 2023-10-08 DIAGNOSIS — E08621 Diabetes mellitus due to underlying condition with foot ulcer: Secondary | ICD-10-CM | POA: Insufficient documentation

## 2023-10-08 DIAGNOSIS — E11628 Type 2 diabetes mellitus with other skin complications: Secondary | ICD-10-CM

## 2023-10-08 DIAGNOSIS — R600 Localized edema: Secondary | ICD-10-CM | POA: Diagnosis not present

## 2023-10-08 LAB — COMPREHENSIVE METABOLIC PANEL WITH GFR
ALT: 14 IU/L (ref 0–44)
AST: 17 IU/L (ref 0–40)
Albumin: 3.6 g/dL — ABNORMAL LOW (ref 3.8–4.8)
Alkaline Phosphatase: 75 IU/L (ref 44–121)
BUN/Creatinine Ratio: 28 — ABNORMAL HIGH (ref 10–24)
BUN: 23 mg/dL (ref 8–27)
Bilirubin Total: 0.7 mg/dL (ref 0.0–1.2)
CO2: 24 mmol/L (ref 20–29)
Calcium: 8.7 mg/dL (ref 8.6–10.2)
Chloride: 100 mmol/L (ref 96–106)
Creatinine, Ser: 0.81 mg/dL (ref 0.76–1.27)
Globulin, Total: 1.5 g/dL (ref 1.5–4.5)
Glucose: 104 mg/dL — ABNORMAL HIGH (ref 70–99)
Potassium: 4 mmol/L (ref 3.5–5.2)
Sodium: 136 mmol/L (ref 134–144)
Total Protein: 5.1 g/dL — ABNORMAL LOW (ref 6.0–8.5)
eGFR: 91 mL/min/{1.73_m2} (ref >59)

## 2023-10-08 LAB — CBC WITH DIFFERENTIAL/PLATELET
Basophils Absolute: 0 10*3/uL (ref 0.0–0.2)
Basos: 0 %
EOS (ABSOLUTE): 0 10*3/uL (ref 0.0–0.4)
Eos: 0 %
Hematocrit: 37 % — ABNORMAL LOW (ref 37.5–51.0)
Hemoglobin: 12.1 g/dL — ABNORMAL LOW (ref 13.0–17.7)
Immature Granulocytes: 3 %
Lymphocytes Absolute: 0.4 10*3/uL — ABNORMAL LOW (ref 0.7–3.1)
Lymphs: 3 %
MCH: 32 pg (ref 26.6–33.0)
MCHC: 32.7 g/dL (ref 31.5–35.7)
MCV: 98 fL — ABNORMAL HIGH (ref 79–97)
Monocytes Absolute: 1.4 10*3/uL — ABNORMAL HIGH (ref 0.1–0.9)
Monocytes: 11 %
Neutrophils Absolute: 10.6 10*3/uL — ABNORMAL HIGH (ref 1.4–7.0)
Neutrophils: 83 %
Platelets: 145 10*3/uL — ABNORMAL LOW (ref 150–450)
RBC: 3.78 x10E6/uL — ABNORMAL LOW (ref 4.14–5.80)
RDW: 16.2 % — ABNORMAL HIGH (ref 11.6–15.4)
WBC: 12.9 10*3/uL — ABNORMAL HIGH (ref 3.4–10.8)

## 2023-10-08 LAB — SEDIMENTATION RATE: Sed Rate: 11 mm/h (ref 0–30)

## 2023-10-08 MED ORDER — IOHEXOL 300 MG/ML  SOLN
100.0000 mL | Freq: Once | INTRAMUSCULAR | Status: AC | PRN
  Administered 2023-10-08: 100 mL via INTRAVENOUS

## 2023-10-08 MED ORDER — METRONIDAZOLE 500 MG PO TABS
500.0000 mg | ORAL_TABLET | Freq: Two times a day (BID) | ORAL | 0 refills | Status: DC
Start: 2023-10-08 — End: 2023-10-13

## 2023-10-08 MED ORDER — HYDROCODONE-ACETAMINOPHEN 10-325 MG PO TABS: 1.0000 | ORAL_TABLET | Freq: Three times a day (TID) | ORAL | 0 refills | Status: DC | PRN

## 2023-10-08 MED ORDER — CIPROFLOXACIN HCL 750 MG PO TABS: 750.0000 mg | ORAL_TABLET | Freq: Two times a day (BID) | ORAL | 0 refills | Status: DC

## 2023-10-08 NOTE — Telephone Encounter (Signed)
 CT reviewed.  There is appear to be a fluid collection/abscess, his presentation is not consistent with necrotizing fasciitis.  I did add pseudomonal and anaerobic coverage however I would like him to have an appointment with podiatry, hopefully Dr. Alvah Auerbach but any of the other podiatrists would be okay within the next day.  Would not recommend whether this needs to be debrided in the operating room.  Cultures pending.  My consultation is available within the urgent care encounter for under notes.

## 2023-10-08 NOTE — H&P (View-Only) (Signed)
    Procedures performed today:    None.  Independent interpretation of notes and tests performed by another provider:   None.  Brief History, Exam, Impression, and Recommendations:    Ulcerated, foot, right, with fat layer exposed (HCC) 78 year old male with neuropathy and Charcot foot, he has had osteomyelitis with amputations in the past, more recently has been treated by podiatry for a plantar right foot ulcer. X-rays over the past month have not shown any evidence of osteomyelitis. He has been treated with doxycycline  and debridements. Unfortunately he had an acute worsening in pain and presented to urgent care. In urgent care the wound was noted to be draining a greenish substance, it was exquisitely painful. The patient is nontoxic today. Today I probed the wound with a culture swab, I was not able to probe to bone, we will send off cultures with concerns for pseudomonal infection. Urgent care provider will go and get a CBC with differential, CMP, and ESR stat. I will also add metronidazole  and ciprofloxacin  to his regimen. He has gotten a bit of itching with levofloxacin  in the past, but at this point I think the benefits of Cipro  outweigh any risks. Due to exquisite pain we will switch from tramadol  to high-dose hydrocodone , and due to concern for osteomyelitis we will get a CT with contrast of his foot today stat. If the CT does reveal osteomyelitis he will need to go to the hospital, if no evidence osteomyelitis and this is simply an acute worsening of his foot ulcer pain then he will need to get an sooner with podiatry likely for additional debridement potentially in the operating room.   ____________________________________________ Joselyn Nicely. Sandy Crumb, M.D., ABFM., CAQSM., AME. Primary Care and Sports Medicine [facility]  Adjunct Professor of [facility] Medicine  [employer]  Restaurant manager, fast food

## 2023-10-08 NOTE — ED Notes (Signed)
#  20g saline lock inserted to LAC x 1 attempt with blood drawn

## 2023-10-08 NOTE — ED Provider Notes (Signed)
 [name]    CSN: [id number] Arrival date & time: 10/08/23  0804      History   Chief Complaint Chief Complaint  Patient presents with   Foot Pain    HPI Levi [name] is a 78 y.o. male.   Levi [name] is a pleasant 79 year old gentleman known to me from prior visits.  He is here today with severe right foot pain.  He has a Charcot foot.  Diabetes.  Amputations to toes on each foot from infection and osteomyelitis.  States that he went to the podiatrist on Friday.  They mentioned putting him on an antibiotic but did not.  She apparently debrided an ulcer or wound at that time.  The antibiotic was not called in until Monday so he took it Monday and Tuesday.  Today is Wednesday he was up all night with severe pain, is here today and cannot put any weight on the foot.  Denies systemic symptoms of infection such as malaise or fever.  The dressing on his foot from last night has green drainage.  Wife accompanies him to visit today    Past Medical History:  Diagnosis Date   Agent orange exposure 1970's   takes Imdur ,Diltiazem , and Atacand  daily   Agent orange exposure    ALLERGIC RHINITIS    Allergy     seasonal allergies   Anemia    iron  deficiency   Asthma    uses inhaler and nebulizers   Atrial fibrillation (HCC)    Bruises easily    [name]/t prednisone  daily   Cataract    Cellulitis 03/22/2020   Chest wall pain 09/21/2019   L scapula, reported 4/27 21/     CHF (congestive heart failure) (HCC)    dx-on meds to treat   Chronic bronchitis (HCC)    "used to get it q yr; last time ?2008" (04/28/2012)   Clotting disorder (HCC) 2019   On blood thinner when I bleed it is for a consideral amount of time. This is tegardless of size of wound.   COPD (chronic obstructive pulmonary disease) (HCC)    agent orange exposure   Degenerative disk disease    "everywhere" (04/28/2012)   Diverticulitis    Dysrhythmia    afib   Elevated uric acid in blood    takes Allopurinol   daily   Emphysema of lung (HCC)    Enlarged prostate    but not on any meds   GERD (gastroesophageal reflux disease)    takes Protonix  daily   H/O hiatal hernia    Headache    History of colonoscopy    History of pneumonia    a. 2010   Hyperlipidemia    takes Lipitor daily; pt. states he takes a preventive   Hypothyroidism    Joint pain    Joint swelling    Leukocytosis    MSSA bacteremia 03/19/2023   Nausea and vomiting 11/12/2022   Neuromuscular disorder (HCC)    bilat legs, and bilat arms   Neuropathy    Osteoporosis 2012   PAF (paroxysmal atrial fibrillation) (HCC)    a. Dx 04/2012, CHA2DS2VASc = 1 (age);  b. 04/2012 Echo: EF 40-50%, mild MR.   Personal history of colonic adenomas 02/09/2013   Pneumonia    Sepsis (HCC) 03/18/2023   Shortness of breath dyspnea    Thyroid  disease    no taking meds-caused dizziness    Patient Active Problem List   Diagnosis Date Noted   Ulcerated, foot, right, with  fat layer exposed (HCC) 10/08/2023   Blood blister 09/04/2023   Osteomyelitis of fifth toe of right foot (HCC) 08/13/2023   Osteomyelitis (HCC) 08/13/2023   Skin ulcer of second toe of left foot with necrosis of bone (HCC) 08/12/2023   Osteomyelitis of second toe of left foot (HCC) 08/12/2023   Hematochezia 07/25/2023   Pain in toe of right fifth foot 06/27/2023   Cellulitis 04/17/2023   Closed fracture of ramus of right pubis (HCC) 04/07/2023   S/P PICC central line placement 03/31/2023   Venous insufficiency of both lower extremities 03/20/2023   Chronic superficial venous thrombosis of both lower extremities 11/18/2022   Venous stasis ulcer (HCC) 09/13/2022   Primary osteoarthritis of right shoulder, status post left shoulder arthroplasty 07/05/2022   Shingles 05/16/2022   Vertigo 04/04/2022   BPH (benign prostatic hyperplasia) 02/21/2022   Annual physical exam 01/22/2022   Charcot's joint of foot, right 01/22/2022   Lower extremity edema 08/08/2021   Heartburn     Atypical chest pain    Cervical radicular pain 06/11/2021   Chronic anticoagulation 03/15/2021   Peripheral neuropathy 02/22/2021   Chronic systolic heart failure (HCC) 02/22/2021   Essential (primary) hypertension 11/15/2020   Arthritis of carpometacarpal (CMC) joint of right thumb 08/07/2020   COPD (chronic obstructive pulmonary disease) (HCC) 03/21/2020   GERD (gastroesophageal reflux disease) status post Nissen fundoplication 12/03/2017   Lumbar stenosis status post T12-S1 fusion 04/07/2017   Cervical spondylosis with radiculopathy 01/01/2017   Cervical radiculopathy 04/11/2016   History of total right hip arthroplasty 07/31/2015   Chronic low back pain 10/13/2014   Primary osteoarthritis of right knee 06/17/2014   Thrush of mouth and esophagus (HCC) 04/03/2014   Hyperlipidemia    History of colonic polyps 02/09/2013   Paroxysmal atrial fibrillation (HCC) 05/11/2012   Osteoarthritis of left knee 01/10/2012   Seasonal and perennial allergic rhinitis 07/23/2010   Asthma, moderate persistent 03/06/2009    Past Surgical History:  Procedure Laterality Date   45 HOUR PH STUDY N/A 05/30/2021   Procedure: 24 HOUR PH STUDY;  Surgeon: Annis Kinder, DO;  Location: [facility];  Service: Gastroenterology;  Laterality: N/A;   AMPUTATION TOE Bilateral 08/13/2023   Procedure: LEFT SECOND TOE AMPUTATION AND RIGHT FIFTH TOE AMPUTATION;  Surgeon: Evertt Hoe, DPM;  Location: [facility];  Service: Orthopedics/Podiatry;  Laterality: Bilateral;   ANTERIOR CERVICAL DECOMP/DISCECTOMY FUSION N/A 07/21/2012   Procedure: ANTERIOR CERVICAL DECOMPRESSION/DISCECTOMY FUSION 2 LEVELS;  Surgeon: Elna Haggis, MD;  Location: [facility];  Service: Neurosurgery;  Laterality: N/A;  C4-5 C5-6 Anterior cervical decompression/diskectomy/fusion   ANTERIOR LAT LUMBAR FUSION N/A 04/07/2017   Procedure: Thoracic twelve-Lumbar one Anterolateral decompression/fusion;  Surgeon: Elna Haggis, MD;  Location:  [facility];  Service: Neurosurgery;  Laterality: N/A;   ARTHRODESIS FOOT WITH WEIL OSTEOTOMY Left 11/07/2022   Procedure: LEFT THIRD METATARSOPHALANGEAL CAPSULOTOMY, WEIL OSTEOTOMY;  Surgeon: Donnamarie Gables, MD;  Location: [facility] OR;  Service: Orthopedics;  Laterality: Left;  LENGTH OF SURGERY: 60 MINUTES   BACK SURGERY     x 3   CARDIAC CATHETERIZATION  11/2008   Dr. Renna Cary - 20% calcified non flow limiting left main, 50% EF apical hypokinesis   CARPAL TUNNEL RELEASE Bilateral    CERVICAL DISC ARTHROPLASTY N/A 04/07/2017   Procedure: Cervical six-seven Disc arthroplasty;  Surgeon: Elna Haggis, MD;  Location: [facility];  Service: Neurosurgery;  Laterality: N/A;   CHEST TUBE INSERTION Left 04/11/2017   Procedure: CHEST TUBE INSERTION;  Surgeon: Carloyn Chi  Lannie Pizza, MD;  [name];  Service: Thoracic;  Laterality: Left;   CHOLECYSTECTOMY     COLONOSCOPY  2016   CG-MAC-miralax (good)servere TICS/TA x 2   ESOPHAGEAL MANOMETRY N/A 05/30/2021   Procedure: ESOPHAGEAL MANOMETRY (EM);  Surgeon: Annis Kinder, DO;  Location: [facility];  Service: Gastroenterology;  Laterality: N/A;  ph impedence   ESOPHAGOGASTRODUODENOSCOPY     EYE SURGERY Bilateral 2011   steroidal encapsulation" (04/28/2012)   FOOT SURGERY Right    x 2   JOINT REPLACEMENT     Many surgeries including 5 joit replacements   KNEE ARTHROSCOPY Bilateral    LATERAL / POSTERIOR COMBINED FUSION LUMBAR SPINE  2012   LEFT ATRIAL APPENDAGE OCCLUSION N/A 02/22/2021   Procedure: LEFT ATRIAL APPENDAGE OCCLUSION;  Surgeon: Boyce Byes, MD;  [facility];  Service: Cardiovascular;  Laterality: N/A;   NEUROPLASTY / TRANSPOSITION ULNAR NERVE AT ELBOW Bilateral    POLYPECTOMY  2016   severe TICS/TA x 2   POSTERIOR CERVICAL LAMINECTOMY WITH MET- RX Right 06/11/2021   Procedure: Right Cervical six-seven  Laminectomy and foraminotomy with metrex;  Surgeon: Elna Haggis, MD;  [facility]: [facility];  Service: Neurosurgery;   Laterality: Right;   REFRACTIVE SURGERY Left 07/07/2023   laser surgery but still has "floaters"   REVERSE SHOULDER ARTHROPLASTY  04/28/2012   Procedure: REVERSE SHOULDER ARTHROPLASTY;  Surgeon: Derald Flattery, MD;  Location: [facility] OR;  Service: Orthopedics;  Laterality: Left;  Left shouder reverse total shoulder arthroplasty   SHOULDER SURGERY     SPINE SURGERY     I have had 7 spinal surgeries   TEE WITHOUT CARDIOVERSION N/A 02/22/2021   Procedure: TRANSESOPHAGEAL ECHOCARDIOGRAM (TEE);  Surgeon: Boyce Byes, MD;  Location: [facility] INVASIVE CV LAB;  Service: Cardiovascular;  Laterality: N/A;   TENDON REPAIR Right 08/07/2020   Procedure: RIGHT HAND LIGAMENT RECONSTRUCTION AND TENDON INTERPOSITION;  Surgeon: Neil Balls, MD;  Location: [facility];  Service: Orthopedics;  Laterality: Right;   TONSILLECTOMY AND ADENOIDECTOMY  1954   TOTAL HIP ARTHROPLASTY Left 2011   "left" (04/28/2012)   TOTAL HIP ARTHROPLASTY Right 07/31/2015   Procedure: TOTAL HIP ARTHROPLASTY ANTERIOR APPROACH;  Surgeon: Neil Balls, MD;  [facility];  Service: Orthopedics;  Laterality: Right;   TOTAL KNEE ARTHROPLASTY  01/10/2012   Procedure: TOTAL KNEE ARTHROPLASTY;  Surgeon: Boston Byers, MD;  Location: [facility];  Service: Orthopedics;;  left total knee arthroplasty   TOTAL KNEE ARTHROPLASTY Right 06/17/2014   Procedure: TOTAL KNEE ARTHROPLASTY;  Surgeon: Boston Byers, MD;  Location: [facility];  Service: Orthopedics;  Laterality: Right;   toupet fundolplication  11/01/2021   TRANSESOPHAGEAL ECHOCARDIOGRAM (CATH LAB) N/A 03/24/2023   Procedure: TRANSESOPHAGEAL ECHOCARDIOGRAM;  Surgeon: Hazle Lites, MD;  [facility];  Service: Cardiovascular;  Laterality: N/A;       Home Medications    Prior to Admission medications   Medication Sig Start Date End Date Taking? Authorizing Provider  albuterol  (VENTOLIN  HFA) 108 (90 Base) MCG/ACT inhaler INHALE 2 PUFFS BY MOUTH EVERY 4  HOURS AS NEEDED FOR WHEEZE OR FOR SHORTNESS OF BREATH 01/14/23  Yes Levi [name], Clinton D, MD  allopurinol  (ZYLOPRIM ) 100 MG tablet TAKE 1 TABLET (100 MG TOTAL) BY MOUTH IN THE MORNING 09/02/23  Yes Gean Keels, MD  atorvastatin  (LIPITOR) 20 MG tablet TAKE 1 TABLET BY MOUTH EVERY DAY 09/02/23  Yes Levi Madura, MD  ciprofloxacin  (CIPRO ) 750 MG tablet Take  1 tablet (750 mg total) by mouth 2 (two) times daily for 7 days. 10/08/23 10/15/23 Yes Gean Keels, MD  cyclobenzaprine  (FLEXERIL ) 10 MG tablet Take 1 tablet (10 mg total) by mouth 3 (three) times daily as needed for muscle spasms. 09/04/23  Yes Gean Keels, MD  diltiazem  (CARDIZEM  CD) 180 MG 24 hr capsule Take 1 capsule (180 mg total) by mouth daily. 08/25/23  Yes Levi Madura, MD  diltiazem  (CARDIZEM ) 30 MG tablet TAKE 1 TABLET (30 MG TOTAL) BY MOUTH AS NEEDED (HEART RATE OVER 120). 09/02/23  Yes Levi Madura, MD  doxycycline  (VIBRA -TABS) 100 MG tablet Take 1 tablet (100 mg total) by mouth 2 (two) times daily for 10 days. 10/06/23 10/16/23 Yes Sikora, Rebecca, DPM  DULoxetine  (CYMBALTA ) 30 MG capsule Take 1 capsule (30 mg total) by mouth daily. 06/17/23  Yes Tye Gall, MD  ELIQUIS  5 MG TABS tablet TAKE 1 TABLET BY MOUTH TWICE A DAY 07/28/23  Yes Levi Madura, MD  ferrous gluconate  (FERGON) 324 MG tablet Take 1 tablet (324 mg total) by mouth daily. 06/18/23  Yes Tye Gall, MD  furosemide  (LASIX ) 40 MG tablet Take 1 tablet (40 mg total) by mouth daily. Patient taking differently: Take 40 mg by mouth daily as needed for edema. 05/27/23  Yes Tye Gall, MD  HYDROcodone -acetaminophen  (NORCO) 10-325 MG tablet Take 1 tablet by mouth every 8 (eight) hours as needed. 10/08/23  Yes Gean Keels, MD  ipratropium-albuterol  (DUONEB) 0.5-2.5 (3) MG/3ML SOLN 1 NEBULE EVERY 6 HOURS AS NEEDED. **J45.3** 03/22/20  Yes Levi [name], Levi Counts D, MD  isosorbide  mononitrate (IMDUR ) 30 MG 24 hr tablet TAKE 1 TABLET  BY MOUTH EVERY DAY 09/02/23  Yes Levi Madura, MD  meclizine  (ANTIVERT ) 25 MG tablet TAKE 1 TABLET BY MOUTH 3 TIMES DAILY AS NEEDED FOR DIZZINESS. 07/03/23  Yes Gean Keels, MD  metroNIDAZOLE  (FLAGYL ) 500 MG tablet Take 1 tablet (500 mg total) by mouth 2 (two) times daily for 7 days. 10/08/23 10/15/23 Yes Gean Keels, MD  Olodaterol HCl (STRIVERDI RESPIMAT) 2.5 MCG/ACT AERS INHALE 2 PUFFS BY MOUTH INTO THE LUNGS DAILY 03/10/23  Yes Levi [name], Levi Counts D, MD  ondansetron  (ZOFRAN -ODT) 8 MG disintegrating tablet Take 1 tablet (8 mg total) by mouth every 8 (eight) hours as needed for nausea. 04/07/23  Yes Gean Keels, MD  pantoprazole  (PROTONIX ) 40 MG tablet TAKE 1 TABLET (40 MG TOTAL) BY MOUTH TWICE A DAY BEFORE MEALS 08/14/23  Yes Gean Keels, MD  predniSONE  (DELTASONE ) 10 MG tablet Take 10 mg by mouth in the morning and at bedtime. 07/05/23  Yes [provider]  pregabalin  (LYRICA ) 100 MG capsule Take 1 capsule (100 mg total) by mouth 2 (two) times daily. 06/17/23  Yes Tye Gall, MD  tamsulosin  (FLOMAX ) 0.4 MG CAPS capsule Take 0.4 mg by mouth at bedtime.   Yes [provider]  traMADol  (ULTRAM ) 50 MG tablet Take 1 tablet (50 mg total) by mouth every 12 (twelve) hours as needed. 09/25/23  Yes Gean Keels, MD    Family History Family History  Problem Relation Age of Onset   Diabetes Mother    Heart disease Father        Died 50, MI   Cancer Father    Hyperlipidemia Father    Stroke Father    Prostate cancer Brother    Colon polyps Brother    Colon polyps Brother    Asthma Brother    COPD Brother  Colon cancer Brother        Dx age 98   Cancer Brother    Colon cancer Other    Esophageal cancer Neg Hx    Rectal cancer Neg Hx    Stomach cancer Neg Hx    Pancreatic cancer Neg Hx    Kidney disease Neg Hx    Liver disease Neg Hx    Neuropathy Neg Hx     Social History Social History   Tobacco Use   Smoking  status: Never   Smokeless tobacco: Never  Vaping Use   Vaping status: Never Used  Substance Use Topics   Alcohol use: No   Drug use: No     Allergies   Levaquin  [levofloxacin ]   Review of Systems Review of Systems See HPI  Physical Exam Triage Vital Signs ED Triage Vitals  Encounter Vitals Group     BP 10/08/23 0826 (!) 91/57     Systolic BP Percentile --      Diastolic BP Percentile --      Pulse Rate 10/08/23 0826 100     Resp 10/08/23 0826 18     Temp 10/08/23 0826 99.2 F (37.3 C)     Temp Source 10/08/23 0826 Oral     SpO2 10/08/23 0826 93 %     Weight --      Height --      Head Circumference --      Peak Flow --      Pain Score 10/08/23 0829 9     Pain Loc --      Pain Education --      Exclude from Growth Chart --    No data found.  Updated Vital Signs BP 122/76   Pulse 83   Temp 98.3 F (36.8 C)   Resp 16   SpO2 94%       Physical Exam Constitutional:      General: He is in acute distress.     Comments: Patient is in significant pain.  Expresses discomfort with any attempted evaluation or palpation of foot     As a seen in the photograph the bottom of the right foot has an ulceration centrally with macerated skin immediately surrounding.  This appears to overlie a large area of purulence with possible skin necrosis which is dark in color. Wound cultures obtained Consultation with Dr. Sandy Crumb was obtained.  UC Treatments / Results  Labs (all labs ordered are listed, but only abnormal results are displayed) Labs Reviewed  AEROBIC/ANAEROBIC CULTURE W GRAM STAIN (SURGICAL/DEEP WOUND)  CBC WITH DIFFERENTIAL/PLATELET  COMPREHENSIVE METABOLIC PANEL WITH GFR  SEDIMENTATION RATE    EKG   Radiology No results found.  Procedures Procedures (including critical care time)  Medications Ordered in UC Medications - No data to display  Initial Impression / Assessment and Plan / UC Course  I have reviewed the triage vital signs and the  nursing notes.  Pertinent labs & imaging results that were available during my care of the patient were reviewed by me and considered in my medical decision making (see chart for details).     Plan, discussed with PCP and patient.  #1 stat labs #2 CT scan with contrast to evaluate severity of infection and to rule out bone involvements.  Patient understands this will require debridement Patient understands he may require hospitalization  Patient transferred to [facility] for imaging. Final Clinical Impressions(s) / UC Diagnoses   Final diagnoses:  Diabetic ulcer of midfoot  associated with diabetes mellitus due to underlying condition, with fat layer exposed, unspecified laterality (HCC)  Diabetic foot infection [facility])     Discharge Instructions      Go to [facility] for the [facility] scan.  You can report to radiology.   ED Prescriptions     Medication Sig Dispense Auth. Provider   HYDROcodone -acetaminophen  (NORCO) 10-325 MG tablet Take 1 tablet by mouth every 8 (eight) hours as needed. 15 tablet Thekkekandam, Thomas J, MD   metroNIDAZOLE  (FLAGYL ) 500 MG tablet Take 1 tablet (500 mg total) by mouth 2 (two) times daily for 7 days. 14 tablet Gean Keels, MD   ciprofloxacin  (CIPRO ) 750 MG tablet Take 1 tablet (750 mg total) by mouth 2 (two) times daily for 7 days. 14 tablet Thekkekandam, Thomas J, MD      PDMP not reviewed this encounter.   Stephany Ehrich, MD 10/08/23 1126

## 2023-10-08 NOTE — Assessment & Plan Note (Signed)
 78 year old male with neuropathy and Charcot foot, he has had osteomyelitis with amputations in the past, more recently has been treated by podiatry for a plantar right foot ulcer. X-rays over the past month have not shown any evidence of osteomyelitis. He has been treated with doxycycline  and debridements. Unfortunately he had an acute worsening in pain and presented to urgent care. In urgent care the wound was noted to be draining a greenish substance, it was exquisitely painful. The patient is nontoxic today. Today I probed the wound with a culture swab, I was not able to probe to bone, we will send off cultures with concerns for pseudomonal infection. Urgent care provider will go and get a CBC with differential, CMP, and ESR stat. I will also add metronidazole  and ciprofloxacin  to his regimen. He has gotten a bit of itching with levofloxacin  in the past, but at this point I think the benefits of Cipro  outweigh any risks. Due to exquisite pain we will switch from tramadol  to high-dose hydrocodone , and due to concern for osteomyelitis we will get a CT with contrast of his foot today stat. If the CT does reveal osteomyelitis he will need to go to the hospital, if no evidence osteomyelitis and this is simply an acute worsening of his foot ulcer pain then he will need to get an sooner with podiatry likely for additional debridement potentially in the operating room.

## 2023-10-08 NOTE — Consult Note (Signed)
    Procedures performed today:    None.  Independent interpretation of notes and tests performed by another provider:   None.  Brief History, Exam, Impression, and Recommendations:    Ulcerated, foot, right, with fat layer exposed (HCC) 78 year old male with neuropathy and Charcot foot, he has had osteomyelitis with amputations in the past, more recently has been treated by podiatry for a plantar right foot ulcer. X-rays over the past month have not shown any evidence of osteomyelitis. He has been treated with doxycycline  and debridements. Unfortunately he had an acute worsening in pain and presented to urgent care. In urgent care the wound was noted to be draining a greenish substance, it was exquisitely painful. The patient is nontoxic today. Today I probed the wound with a culture swab, I was not able to probe to bone, we will send off cultures with concerns for pseudomonal infection. Urgent care provider will go and get a CBC with differential, CMP, and ESR stat. I will also add metronidazole  and ciprofloxacin  to his regimen. He has gotten a bit of itching with levofloxacin  in the past, but at this point I think the benefits of Cipro  outweigh any risks. Due to exquisite pain we will switch from tramadol  to high-dose hydrocodone , and due to concern for osteomyelitis we will get a CT with contrast of his foot today stat. If the CT does reveal osteomyelitis he will need to go to the hospital, if no evidence osteomyelitis and this is simply an acute worsening of his foot ulcer pain then he will need to get an sooner with podiatry likely for additional debridement potentially in the operating room.   ____________________________________________ Joselyn Nicely. Sandy Crumb, M.D., ABFM., CAQSM., AME. Primary Care and Sports Medicine [facility]  Adjunct Professor of [facility] Medicine  [employer]  Restaurant manager, fast food

## 2023-10-08 NOTE — ED Notes (Signed)
 Dr. Nicholette Barley sts may go to [facility] with IV in place. Iv site unremarkable, dsg clean dry and intact

## 2023-10-08 NOTE — Telephone Encounter (Signed)
 Dr. Annemarie Kil called and would like to speak with Dr. Lennie Ra nurse, regarding patient; Levi [name]: [birth year] Please contact provider at [phone]

## 2023-10-08 NOTE — ED Triage Notes (Signed)
 Patient c/o right foot pain, seen his podiatrist on Friday, removed some skin from the bottom of the foot.  Started on an antibiotic on Monday but pain is unbearable.  Patient's wife feels the wound is changing and very concerned.  Patient has taken Tramadol  this am.

## 2023-10-08 NOTE — Discharge Instructions (Signed)
 Go to [facility] for the CT scan.  You can report to radiology.

## 2023-10-09 ENCOUNTER — Telehealth: Payer: Self-pay

## 2023-10-09 ENCOUNTER — Ambulatory Visit (HOSPITAL_COMMUNITY): Payer: Self-pay

## 2023-10-09 NOTE — Telephone Encounter (Signed)
 Spoke with Melissa- Dr. Charleston Conrad nurse  Patient is now scheduled for 1:!5pm tomorrow with Dr. Edmundo Gourd Patient informed.

## 2023-10-09 NOTE — Telephone Encounter (Signed)
 Attempted call to patient The patient left the office before the visit was finished. Left a voice mail message requesting a return call.

## 2023-10-09 NOTE — Telephone Encounter (Signed)
 Okay excellent, thank you for your help, Burdette Carolin!

## 2023-10-09 NOTE — Telephone Encounter (Signed)
 Spoke with patient. He did not need anything regarding his lab results. We had already spoken regarding appt with podiatry for tomorrow.

## 2023-10-09 NOTE — Telephone Encounter (Signed)
 Patient informed of CT results and needing to be seen at podiatry and that Dr. Sandy Crumb was attempting to contact Dr. Sikora .

## 2023-10-09 NOTE — Telephone Encounter (Signed)
 Copied from CRM [id number]. Topic: Clinical - Lab/Test Results >> Oct 09, 2023 12:11 PM Tisa Forester wrote: Reason for CRM: Patient returning Burdette Carolin call regarding his lab results  Call back number is [phone]

## 2023-10-10 ENCOUNTER — Telehealth: Payer: Self-pay | Admitting: Podiatry

## 2023-10-10 ENCOUNTER — Ambulatory Visit: Admitting: Podiatry

## 2023-10-10 NOTE — Telephone Encounter (Signed)
 Pt went to [place] location to find out that his apt was in [place]. Can he be worked in the schedule today or Monday?

## 2023-10-13 ENCOUNTER — Ambulatory Visit: Admitting: Podiatry

## 2023-10-13 ENCOUNTER — Ambulatory Visit

## 2023-10-13 DIAGNOSIS — L02611 Cutaneous abscess of right foot: Secondary | ICD-10-CM

## 2023-10-13 DIAGNOSIS — L97512 Non-pressure chronic ulcer of other part of right foot with fat layer exposed: Secondary | ICD-10-CM

## 2023-10-13 DIAGNOSIS — M14671 Charcot's joint, right ankle and foot: Secondary | ICD-10-CM | POA: Diagnosis not present

## 2023-10-13 LAB — AEROBIC/ANAEROBIC CULTURE W GRAM STAIN (SURGICAL/DEEP WOUND)

## 2023-10-13 MED ORDER — METRONIDAZOLE 500 MG PO TABS: 500.0000 mg | ORAL_TABLET | Freq: Two times a day (BID) | ORAL | 0 refills | Status: DC

## 2023-10-13 MED ORDER — CIPROFLOXACIN HCL 500 MG PO TABS: 500.0000 mg | ORAL_TABLET | Freq: Two times a day (BID) | ORAL | 0 refills | Status: DC

## 2023-10-13 NOTE — Progress Notes (Signed)
 Chief Complaint  Patient presents with   Foot Ulcer    Right foot ulcer     HPI: 78 y.o. male PMHx nondiabetic Charcot neuroarthropathy, RT fifth toe amputation 08/13/2023, Dr. Rosemarie Conquest, presenting today for evaluation of development of an ulcer to the plantar aspect of the right foot.  Last seen in office 10/03/2023, Dr. Frances Ingles location.  At that time CT scan was ordered.  Presenting to review CT scan results and further treatment options urgent work in today.  Currently on combination oral ciprofloxacin  750 mg BID x 7 days in combination with Flagyl  500 mg BID x 7days prescribed by his PCP.  Past Medical History:  Diagnosis Date   Agent orange exposure 1970's   takes Imdur ,Diltiazem , and Atacand  daily   Agent orange exposure    ALLERGIC RHINITIS    Allergy     seasonal allergies   Anemia    iron  deficiency   Asthma    uses inhaler and nebulizers   Atrial fibrillation (HCC)    Bruises easily    d/t prednisone  daily   Cataract    Cellulitis 03/22/2020   Chest wall pain 09/21/2019   L scapula, reported 4/27 21/     CHF (congestive heart failure) (HCC)    dx-on meds to treat   Chronic bronchitis (HCC)    "used to get it q yr; last time ?2008" (04/28/2012)   Clotting disorder (HCC) 2019   On blood thinner when I bleed it is for a consideral amount of time. This is tegardless of size of wound.   COPD (chronic obstructive pulmonary disease) (HCC)    agent orange exposure   Degenerative disk disease    "everywhere" (04/28/2012)   Diverticulitis    Dysrhythmia    afib   Elevated uric acid in blood    takes Allopurinol  daily   Emphysema of lung (HCC)    Enlarged prostate    but not on any meds   GERD (gastroesophageal reflux disease)    takes Protonix  daily   H/O hiatal hernia    Headache    History of colonoscopy    History of pneumonia    a. 2010   Hyperlipidemia    takes Lipitor daily; pt. states he takes a preventive   Hypothyroidism    Joint pain     Joint swelling    Leukocytosis    MSSA bacteremia 03/19/2023   Nausea and vomiting 11/12/2022   Neuromuscular disorder (HCC)    bilat legs, and bilat arms   Neuropathy    Osteoporosis 2012   PAF (paroxysmal atrial fibrillation) (HCC)    a. Dx 04/2012, CHA2DS2VASc = 1 (age);  b. 04/2012 Echo: EF 40-50%, mild MR.   Personal history of colonic adenomas 02/09/2013   Pneumonia    Sepsis (HCC) 03/18/2023   Shortness of breath dyspnea    Thyroid  disease    no taking meds-caused dizziness    Past Surgical History:  Procedure Laterality Date   71 HOUR PH STUDY N/A 05/30/2021   Procedure: 24 HOUR PH STUDY;  Surgeon: Annis Kinder, DO;  Location: [facility];  Service: Gastroenterology;  Laterality: N/A;   AMPUTATION TOE Bilateral 08/13/2023   Procedure: LEFT SECOND TOE AMPUTATION AND RIGHT FIFTH TOE AMPUTATION;  Surgeon: Evertt Hoe, DPM;  Location: [facility];  Service: Orthopedics/Podiatry;  Laterality: Bilateral;   ANTERIOR CERVICAL DECOMP/DISCECTOMY FUSION N/A 07/21/2012   Procedure: ANTERIOR CERVICAL DECOMPRESSION/DISCECTOMY FUSION 2 LEVELS;  Surgeon: Elna Haggis, MD;  Location: [facility];  Service: Neurosurgery;  Laterality: N/A;  C4-5 C5-6 Anterior cervical decompression/diskectomy/fusion   ANTERIOR LAT LUMBAR FUSION N/A 04/07/2017   Procedure: Thoracic twelve-Lumbar one Anterolateral decompression/fusion;  Surgeon: Elna Haggis, MD;  Location: [facility];  Service: Neurosurgery;  Laterality: N/A;   ARTHRODESIS FOOT WITH WEIL OSTEOTOMY Left 11/07/2022   Procedure: LEFT THIRD METATARSOPHALANGEAL CAPSULOTOMY, WEIL OSTEOTOMY;  Surgeon: Donnamarie Gables, MD;  Location: [facility] OR;  Service: Orthopedics;  Laterality: Left;  LENGTH OF SURGERY: 60 MINUTES   BACK SURGERY     x 3   CARDIAC CATHETERIZATION  11/2008   Dr. Renna Cary - 20% calcified non flow limiting left main, 50% EF apical hypokinesis   CARPAL TUNNEL RELEASE Bilateral    CERVICAL DISC ARTHROPLASTY N/A 04/07/2017    Procedure: Cervical six-seven Disc arthroplasty;  Surgeon: Elna Haggis, MD;  [facility]: [facility];  Service: Neurosurgery;  Laterality: N/A;   CHEST TUBE INSERTION Left 04/11/2017   Procedure: CHEST TUBE INSERTION;  Surgeon: Heriberto London, MD;  Location: [facility] OR;  Service: Thoracic;  Laterality: Left;   CHOLECYSTECTOMY     COLONOSCOPY  2016   CG-MAC-miralax (good)servere TICS/TA x 2   ESOPHAGEAL MANOMETRY N/A 05/30/2021   Procedure: ESOPHAGEAL MANOMETRY (EM);  Surgeon: Annis Kinder, DO;  Location: [facility];  Service: Gastroenterology;  Laterality: N/A;  ph impedence   ESOPHAGOGASTRODUODENOSCOPY     EYE SURGERY Bilateral 2011   steroidal encapsulation" (04/28/2012)   FOOT SURGERY Right    x 2   JOINT REPLACEMENT     Many surgeries including 5 joit replacements   KNEE ARTHROSCOPY Bilateral    LATERAL / POSTERIOR COMBINED FUSION LUMBAR SPINE  2012   LEFT ATRIAL APPENDAGE OCCLUSION N/A 02/22/2021   Procedure: LEFT ATRIAL APPENDAGE OCCLUSION;  Surgeon: Boyce Byes, MD;  [facility];  Service: Cardiovascular;  Laterality: N/A;   NEUROPLASTY / TRANSPOSITION ULNAR NERVE AT ELBOW Bilateral    POLYPECTOMY  2016   severe TICS/TA x 2   POSTERIOR CERVICAL LAMINECTOMY WITH MET- RX Right 06/11/2021   Procedure: Right Cervical six-seven  Laminectomy and foraminotomy with metrex;  Surgeon: Elna Haggis, MD;  Location: [facility];  Service: Neurosurgery;  Laterality: Right;   REFRACTIVE SURGERY Left 07/07/2023   laser surgery but still has "floaters"   REVERSE SHOULDER ARTHROPLASTY  04/28/2012   Procedure: REVERSE SHOULDER ARTHROPLASTY;  Surgeon: Derald Flattery, MD;  Location: [facility] OR;  Service: Orthopedics;  Laterality: Left;  Left shouder reverse total shoulder arthroplasty   SHOULDER SURGERY     SPINE SURGERY     I have had 7 spinal surgeries   TEE WITHOUT CARDIOVERSION N/A 02/22/2021   Procedure: TRANSESOPHAGEAL ECHOCARDIOGRAM (TEE);  Surgeon: Boyce Byes,  MD;  Location[facility]VAS[facility] CV LAB;  Service: Cardiovascular;  Laterality: N/A;   TENDON REPAIR Right 08/07/2020   Procedure: RIGHT HAND LIGAMENT RECONSTRUCTION AND TENDON INTERPOSITION;  Surgeon: Neil Balls, MD;  Location: [facility];  Service: Orthopedics;  Laterality: Right;   TONSILLECTOMY AND ADENOIDECTOMY  1954   TOTAL HIP ARTHROPLASTY Left 2011   "left" (04/28/2012)   TOTAL HIP ARTHROPLASTY Right 07/31/2015   Procedure: TOTAL HIP ARTHROPLASTY ANTERIOR APPROACH;  Surgeon: Neil Balls, MD;  [facility];  Service: Orthopedics;  Laterality: Right;   TOTAL KNEE ARTHROPLASTY  01/10/2012   Procedure: TOTAL KNEE ARTHROPLASTY;  Surgeon: Boston Byers, MD;  Location: [facility];  Service: Orthopedics;;  left total knee arthroplasty   TOTAL KNEE ARTHROPLASTY Right 06/17/2014  Procedure: TOTAL KNEE ARTHROPLASTY;  Surgeon: Boston Byers, MD;  Location: [facility];  Service: Orthopedics;  Laterality: Right;   toupet fundolplication  11/01/2021   TRANSESOPHAGEAL ECHOCARDIOGRAM (CATH LAB) N/A 03/24/2023   Procedure: TRANSESOPHAGEAL ECHOCARDIOGRAM;  Surgeon: Hazle Lites, MD;  [facility];  Service: Cardiovascular;  Laterality: N/A;    Allergies  Allergen Reactions   Levaquin  [Levofloxacin ] Itching    Tolerated ciprofloxacin  in 2022.    RT foot 10/13/2023  Physical Exam: General: The patient is alert and oriented x3 in no acute distress.  No acute changes to the patient's disposition over the past week.  Dermatology: Ulcer plantar aspect of the right foot.  Fibrotic wound base.  Blunt probe was inserted into the central open wound area (please see above photo) and undermining noted with the greatest amount measuring 3.5 cm.  The wound does probe to bone.  No significant purulence.  No appreciable malodor.  Vascular:  VAS US  ABI WITH/WO TBI 06/20/2022 ABI Findings:  +---------+------------------+-----+---------+--------+  Right   Rt Pressure (mmHg)IndexWaveform  Comment   +---------+------------------+-----+---------+--------+  Brachial 126                    triphasic          +---------+------------------+-----+---------+--------+  DP      197               1.53 triphasic          +---------+------------------+-----+---------+--------+  Great Toe90                0.70 Normal             +---------+------------------+-----+---------+--------+   +---------+------------------+-----+---------+-------+  Left    Lt Pressure (mmHg)IndexWaveform Comment  +---------+------------------+-----+---------+-------+  Brachial 129                    triphasic         +---------+------------------+-----+---------+-------+  PTA     217               1.68 triphasic         +---------+------------------+-----+---------+-------+  DP      176               1.36 triphasic         +---------+------------------+-----+---------+-------+  Great Toe129               1.00 Normal            +---------+------------------+-----+---------+-------+   +-------+-----------+-----------+------------+------------+  ABI/TBIToday's ABIToday's TBIPrevious ABIPrevious TBI  +-------+-----------+-----------+------------+------------+  Right [place]         0.70                                 +-------+-----------+-----------+------------+------------+  Left  [place]         1.00                                 +-------+-----------+-----------+------------+------------+  Summary:  Right: Resting right ankle-brachial index indicates noncompressible right  lower extremity arteries. The right toe-brachial index is normal.   Left: Resting left ankle-brachial index indicates noncompressible left  lower extremity arteries. The left toe-brachial index is normal.   Seen and consulted by vascular 06/24/2022, Dr. Reinaldo Caras [facility].  Please see note  Neurological: Light touch and protective threshold diminished  Musculoskeletal  Exam: Charcot neuroarthropathy right foot  Radiographic Exam RT foot 10/13/2023:  Charcot neuroarthropathy with collapse of the midtarsal joint and tarsometatarsal joint best visualized on lateral view.  No obvious sign of acute osseous erosion or osteomyelitis.  Subtle solitary isolated radiolucency noted within the soft tissue within the right forefoot  CT FOOT RIGHT W CONTRAST 10/08/2023 IMPRESSION: 1. Soft tissue wound involving the plantar aspect of the midfoot at the level of the Lisfranc joint with underlying ill-defined fluid collection suspicious for abscess. There is ill-defined fluid and air bubbles tracking distally along the plantar fascia and extensor tendons to the level of the mid metatarsals, suspicious for necrotizing infection. 2. No evidence of acute osteomyelitis. 3. Advanced Charcot changes at the Lisfranc joint. 4. Previous amputation of the small toe at the metatarsophalangeal joint and previous resection of the 2nd through 4th proximal phalangeal heads.   Assessment/Plan of Care: 1. Charcot neuroarthropathy right foot with collapse of the midtarsal and TMT joint 2.  Ulcer with underlying abscess right plantar foot 3.  Concern for possible underlying osteomyelitis; wound probes to bone  4.  Peripheral polyneuropathy right 5.  Chronic lower extremity edema secondary to CHF   -Patient evaluated.  Chart reviewed -Plan according to the patient he has been consulted by other physicians who recommended Charcot reconstruction of the right foot.  He ultimately declined the reconstructive surgery due to the high risk of limb loss and the long postoperative recovery course.  -Today I discussed a more simple approach which would include a planing exostectomy of the plantar portion of bone.  Bone biopsy.  And incision and drainage of the plantar ulcer/abscess.  Risk benefits advantages and disadvantages of the procedure were explained in length in detail to the patient as well  as his spouse who was present.  No guarantees were expressed or implied.  After a lengthy discussion with the patient and spouse they would both like to proceed with the more simple surgical approach to alleviate pressure from the plantar wound. -Authorization for surgery was initiated today.  Surgery will consist of partial excision of foot bone right plantar foot.  Bone biopsy right.  Incision and drainage right foot. -In the meantime continue oral antibiotics as prescribed.  Refill provided -If patient's condition worsens or if he begins to experience any systemic signs of infection such as fever, general malaise, or lethargy, or any change in the patient's overall wellbeing to go immediately to the emergency department for admission and IV antibiotics. -Return to clinic 1 week postop      Dot Gazella, DPM [facility]  Dr. Dot Gazella, DPM    [address] [place], [place] [address]                Office [phone]  Fax [phone]

## 2023-10-14 ENCOUNTER — Encounter: Payer: Self-pay | Admitting: Podiatry

## 2023-10-14 ENCOUNTER — Telehealth: Payer: Self-pay | Admitting: Podiatry

## 2023-10-14 ENCOUNTER — Telehealth: Payer: Self-pay | Admitting: *Deleted

## 2023-10-14 MED ORDER — HYDROCODONE-ACETAMINOPHEN 10-325 MG PO TABS: 1.0000 | ORAL_TABLET | Freq: Three times a day (TID) | ORAL | 0 refills | Status: DC | PRN

## 2023-10-14 NOTE — Progress Notes (Addendum)
 Anesthesia Review:  PCP: Thekkekandam LOV 10/14/2023  Cardiologist : Renna Cary- LOV 10/25/22  kathryn lawrence,NP clearance 10/14/23  Pulm- DR C Young- LOV 06/17/23   PPM/ ICD: Device Orders: Rep Notified:  Chest x-ray : EKG : 03/20/23  Echo : Stress test: Cardiac Cath :   Activity level: can do a flgiht of stairs wtihout difficuty  Sleep Study/ CPAP : none  Fasting Blood Sugar :      / Checks Blood Sugar -- times a day:    Blood Thinner/ Instructions /Last Dose: ASA / Instructions/ Last Dose :    10/08/2023- cbc and cmp- epic    Eliquis - last dose on 10/12/23 per pt    10/15/23- preop phone call completed with med hx and preop instructons.  PT aware to call Admitting at end of  phone call at [phone].    Called pt on 1640pm and made him aware to take Imdur  am of surgery on 10/16/23.  PT voiced understanding.

## 2023-10-14 NOTE — Telephone Encounter (Signed)
   Pre-operative Risk Assessment    Patient Name: Levi [name]  DOB: [birth year] MRN: [id number]   Date of last office visit: 10/25/22 DR. SKAINS Date of next office visit: 10/21/23 DR. SKAINS  Request for Surgical Clearance    Procedure:  PARTIAL EXCISION OF BONE OF RIGHT FOOT AND Bx OF RIGHT FOOT EXCISION, EXOSTOSIS      Date of Surgery:  Clearance 10/16/23  URGENT                              Surgeon:  DR. Wilnette Haste Surgeon's Group or Practice Name:  [facility] Phone number:  [phone] Fax number:  [phone] [name]   Type of Clearance Requested:   - Medical  - Pharmacy:  Hold Apixaban  (Eliquis )     Type of Anesthesia:  General  .  Additional requests/questions:    [name]   10/14/2023, 2:09 PM

## 2023-10-14 NOTE — Telephone Encounter (Signed)
 Patient with diagnosis of afib on Eliquis  for anticoagulation.    Procedure: PARTIAL EXCISION OF BONE OF RIGHT FOOT AND Bx OF RIGHT FOOT EXCISION, EXOSTOSIS   Date of procedure: 10/16/23   CHA2DS2-VASc Score = 4   This indicates a 4.8% annual risk of stroke. The patient's score is based upon: CHF History: 1 HTN History: 1 Diabetes History: 0 Stroke History: 0 Vascular Disease History: 0 Age Score: 2 Gender Score: 0      CrCl 101 ml/min Platelet count 145  Patient has not had an Afib/aflutter ablation within the last 3 months or DCCV within the last 30 days  Per office protocol, patient can hold Eliquis  for 2 days prior to procedure.    **This guidance is not considered finalized until pre-operative APP has relayed final recommendations.**

## 2023-10-14 NOTE — Telephone Encounter (Signed)
 Sheryle Donning from [facility] called stating she has received a surgery clearance on this pt and she needs to talk with someone about what surgery etc. Please call her at [phone]

## 2023-10-15 ENCOUNTER — Encounter (HOSPITAL_COMMUNITY)
Admission: RE | Admit: 2023-10-15 | Discharge: 2023-10-15 | Disposition: A | Discharge status: Home / Self Care | Source: Ambulatory Visit | Attending: Podiatry | Admitting: Podiatry

## 2023-10-15 ENCOUNTER — Other Ambulatory Visit: Payer: Self-pay

## 2023-10-15 ENCOUNTER — Encounter (HOSPITAL_COMMUNITY): Payer: Self-pay

## 2023-10-15 DIAGNOSIS — Z01818 Encounter for other preprocedural examination: Secondary | ICD-10-CM

## 2023-10-15 HISTORY — DX: Unspecified osteoarthritis, unspecified site: M19.90

## 2023-10-15 NOTE — Telephone Encounter (Signed)
   Patient Name: Levi [name]  DOB: 1945/06/10 MRN: [id number]  Primary Cardiologist: Dorothye Gathers, MD  Clinical pharmacists have reviewed the patient's past medical history, labs, and current medications as part of preoperative protocol coverage. The following recommendations have been made:  Date of procedure: 10/16/23     CHA2DS2-VASc Score = 4   This indicates a 4.8% annual risk of stroke. The patient's score is based upon: CHF History: 1 HTN History: 1 Diabetes History: 0 Stroke History: 0 Vascular Disease History: 0 Age Score: 2 Gender Score: 0       CrCl 101 ml/min Platelet count 145   Patient has not had an Afib/aflutter ablation within the last 3 months or DCCV within the last 30 days   Per office protocol, patient can hold Eliquis  for 2 days prior to procedure.     Patient has appointment with cardiologist, Dr. Renna Cary on 11/21/2023.  If this can wait until then it would be best.  It is recommended that Eliquis  be held for 2 days prior to the procedure and restarted ASAP thereafter.  Dr. Renna Cary can give pre-op clearance if this is required on Oct 21, 2023 if you need both MEDICAL and PHARM clearances.    I will route this recommendation to the requesting party via Epic fax function and remove from pre-op pool.  Please call with questions.  Friddie Jetty, NP 10/15/2023, 9:00 AM

## 2023-10-15 NOTE — Progress Notes (Addendum)
 Anesthesia Chart Review   Case: [id number] Date/Time: 10/16/23 1300   Procedure: EXCISION, EXOSTOSIS (Right)   Anesthesia type: Choice   Diagnosis:      Charcot's joint of ankle, right [M14.671]     Abscess of right foot [L02.611]     Right foot ulcer, with fat layer exposed (HCC) [L97.512]   Pre-op diagnosis:      Charcot's joint of ankle, right     Abscess of right foot     Right foot ulcer, with fat layer exposed   Location: [facility]OM 08 / [facility]   Surgeons: Dot Gazella, DPM       DISCUSSION:78 y.o. never smoker with h/o asthma, COPD, PAF, CHF, right foot abscess, right foot ulcer scheduled for above procedure 10/16/2023 with Wilnette Haste, DPM.   He is on prednisone  10 mg BID for moderate persistent asthma that is followed by pulmonologist Dr. Linder Revere. Last visit 06/17/23. Continue Striverdi and as needed albuterol  MDI/nebulizer. He suggested trying to cut evening prednisone  down to 5 mg, but he continues on 10 mg BID.  Follow-up in 4 months planned.   Per cardiology preoperative evaluation 07/18/2023, "Chart reviewed as part of pre-operative protocol coverage. Given past medical history and time since last visit, based on ACC/AHA guidelines, Levi [name] is at acceptable risk for the planned procedure without further cardiovascular testing.    The patient was advised that if he develops new symptoms prior to surgery to contact our office to arrange for a follow-up visit, and he verbalized understanding.   Okay to hold Eliquis  for 3 days prior to the procedure. See notes from 07/07/2023 as he was cleared 10 days ago for temporary implantation of stimulator."  Last dose of Eliquis  PM dose 10/12/2023.   Pt seen in PAT 10/15/2023 at 3pm, H&P requested. [name] Miner states she has sent the form to Dr. Clyde Darling, still pending.   VS: There were no vitals taken for this visit.  PROVIDERS: Gean Keels, MD is PCP   Dorothye Gathers, MD is Cardiologist  LABS: Labs reviewed: Acceptable for  surgery. (all labs ordered are listed, but only abnormal results are displayed)  Labs Reviewed - No data to display   IMAGES:   EKG:   CV: Echo 03/24/2023 1. Left ventricular ejection fraction, by estimation, is 40 to 45%. The  left ventricle has mildly decreased function. The left ventricle  demonstrates global hypokinesis. The left ventricular internal cavity size  was mildly dilated.   2. Right ventricular systolic function is normal. The right ventricular  size is normal.   3. No left atrial/left atrial appendage thrombus was detected.   4. The mitral valve is grossly normal. Mild mitral valve regurgitation.   5. The aortic valve is tricuspid. Aortic valve regurgitation trivial to  mild. No aortic stenosis is present.   6. Agitated saline contrast bubble study was negative, with no evidence  of any interatrial shunt.  Past Medical History:  Diagnosis Date   Agent orange exposure 1970's   takes Imdur ,Diltiazem , and Atacand  daily   Agent orange exposure    ALLERGIC RHINITIS    Allergy     seasonal allergies   Anemia    iron  deficiency   Asthma    uses inhaler and nebulizers   Atrial fibrillation (HCC)    Bruises easily    d/t prednisone  daily   Cataract    Cellulitis 03/22/2020   Chest wall pain 09/21/2019   L scapula, reported 4/27 21/  CHF (congestive heart failure) (HCC)    dx-on meds to treat   Chronic bronchitis (HCC)    "used to get it q yr; last time ?2008" (04/28/2012)   Clotting disorder (HCC) 2019   On blood thinner when I bleed it is for a consideral amount of time. This is tegardless of size of wound.   COPD (chronic obstructive pulmonary disease) (HCC)    agent orange exposure   Degenerative disk disease    "everywhere" (04/28/2012)   Diverticulitis    Dysrhythmia    afib   Elevated uric acid in blood    takes Allopurinol  daily   Emphysema of lung (HCC)    Enlarged prostate    but not on any meds   GERD (gastroesophageal reflux disease)     takes Protonix  daily   H/O hiatal hernia    Headache    History of colonoscopy    History of pneumonia    a. 2010   Hyperlipidemia    takes Lipitor daily; pt. states he takes a preventive   Hypothyroidism    Joint pain    Joint swelling    Leukocytosis    MSSA bacteremia 03/19/2023   Nausea and vomiting 11/12/2022   Neuromuscular disorder (HCC)    bilat legs, and bilat arms   Neuropathy    Osteoporosis 2012   PAF (paroxysmal atrial fibrillation) (HCC)    a. Dx 04/2012, CHA2DS2VASc = 1 (age);  b. 04/2012 Echo: EF 40-50%, mild MR.   Personal history of colonic adenomas 02/09/2013   Pneumonia    Sepsis (HCC) 03/18/2023   Shortness of breath dyspnea    Thyroid  disease    no taking meds-caused dizziness    Past Surgical History:  Procedure Laterality Date   24 HOUR PH STUDY N/A 05/30/2021   Procedure: 24 HOUR PH STUDY;  Surgeon: Annis Kinder, DO;  [facility];  Service: Gastroenterology;  Laterality: N/A;   AMPUTATION TOE Bilateral 08/13/2023   Procedure: LEFT SECOND TOE AMPUTATION AND RIGHT FIFTH TOE AMPUTATION;  Surgeon: Evertt Hoe, DPM;  [facility];  Service: Orthopedics/Podiatry;  Laterality: Bilateral;   ANTERIOR CERVICAL DECOMP/DISCECTOMY FUSION N/A 07/21/2012   Procedure: ANTERIOR CERVICAL DECOMPRESSION/DISCECTOMY FUSION 2 LEVELS;  Surgeon: Elna Haggis, MD;  [facility];  Service: Neurosurgery;  Laterality: N/A;  C4-5 C5-6 Anterior cervical decompression/diskectomy/fusion   ANTERIOR LAT LUMBAR FUSION N/A 04/07/2017   Procedure: Thoracic twelve-Lumbar one Anterolateral decompression/fusion;  Surgeon: Elna Haggis, MD;  [facility];  Service: Neurosurgery;  Laterality: N/A;   ARTHRODESIS FOOT WITH WEIL OSTEOTOMY Left 11/07/2022   Procedure: LEFT THIRD METATARSOPHALANGEAL CAPSULOTOMY, WEIL OSTEOTOMY;  Surgeon: Donnamarie Gables, MD;  Location: [facility] OR;  Service: Orthopedics;  Laterality: Left;  LENGTH OF SURGERY: 60 MINUTES    BACK SURGERY     x 3   CARDIAC CATHETERIZATION  11/2008   Dr. Renna Cary - 20% calcified non flow limiting left main, 50% EF apical hypokinesis   CARPAL TUNNEL RELEASE Bilateral    CERVICAL DISC ARTHROPLASTY N/A 04/07/2017   Procedure: Cervical six-seven Disc arthroplasty;  Surgeon: Elna Haggis, MD;  [facility];  Service: Neurosurgery;  Laterality: N/A;   CHEST TUBE INSERTION Left 04/11/2017   Procedure: CHEST TUBE INSERTION;  Surgeon: Heriberto London, MD;  Location: [facility] OR;  Service: Thoracic;  Laterality: Left;   CHOLECYSTECTOMY     COLONOSCOPY  2016   CG-MAC-miralax (good)servere TICS/TA x 2   ESOPHAGEAL MANOMETRY N/A 05/30/2021   Procedure: ESOPHAGEAL MANOMETRY (  EM);  Surgeon: Annis Kinder, DO;  [facility];  Service: Gastroenterology;  Laterality: N/A;  ph impedence   ESOPHAGOGASTRODUODENOSCOPY     EYE SURGERY Bilateral 2011   steroidal encapsulation" (04/28/2012)   FOOT SURGERY Right    x 2   JOINT REPLACEMENT     Many surgeries including 5 joit replacements   KNEE ARTHROSCOPY Bilateral    LATERAL / POSTERIOR COMBINED FUSION LUMBAR SPINE  2012   LEFT ATRIAL APPENDAGE OCCLUSION N/A 02/22/2021   Procedure: LEFT ATRIAL APPENDAGE OCCLUSION;  Surgeon: Boyce Byes, MD;  [facility];  Service: Cardiovascular;  Laterality: N/A;   NEUROPLASTY / TRANSPOSITION ULNAR NERVE AT ELBOW Bilateral    POLYPECTOMY  2016   severe TICS/TA x 2   POSTERIOR CERVICAL LAMINECTOMY WITH MET- RX Right 06/11/2021   Procedure: Right Cervical six-seven  Laminectomy and foraminotomy with metrex;  Surgeon: Elna Haggis, MD;  [facility];  Service: Neurosurgery;  Laterality: Right;   REFRACTIVE SURGERY Left 07/07/2023   laser surgery but still has "floaters"   REVERSE SHOULDER ARTHROPLASTY  04/28/2012   Procedure: REVERSE SHOULDER ARTHROPLASTY;  Surgeon: Derald Flattery, MD;  Location: [facility] OR;  Service: Orthopedics;  Laterality: Left;  Left shouder reverse  total shoulder arthroplasty   SHOULDER SURGERY     SPINE SURGERY     I have had 7 spinal surgeries   TEE WITHOUT CARDIOVERSION N/A 02/22/2021   Procedure: TRANSESOPHAGEAL ECHOCARDIOGRAM (TEE);  Surgeon: Boyce Byes, MD;  Location: [facility] INVASIVE CV LAB;  Service: Cardiovascular;  Laterality: N/A;   TENDON REPAIR Right 08/07/2020   Procedure: RIGHT HAND LIGAMENT RECONSTRUCTION AND TENDON INTERPOSITION;  Surgeon: Neil Balls, MD;  Location: [facility];  Service: Orthopedics;  Laterality: Right;   TONSILLECTOMY AND ADENOIDECTOMY  1954   TOTAL HIP ARTHROPLASTY Left 2011   "left" (04/28/2012)   TOTAL HIP ARTHROPLASTY Right 07/31/2015   Procedure: TOTAL HIP ARTHROPLASTY ANTERIOR APPROACH;  Surgeon: Neil Balls, MD;  [facility];  Service: Orthopedics;  Laterality: Right;   TOTAL KNEE ARTHROPLASTY  01/10/2012   Procedure: TOTAL KNEE ARTHROPLASTY;  Surgeon: Boston Byers, MD;  [facility];  Service: Orthopedics;;  left total knee arthroplasty   TOTAL KNEE ARTHROPLASTY Right 06/17/2014   Procedure: TOTAL KNEE ARTHROPLASTY;  Surgeon: Boston Byers, MD;  [facility];  Service: Orthopedics;  Laterality: Right;   toupet fundolplication  11/01/2021   TRANSESOPHAGEAL ECHOCARDIOGRAM (CATH LAB) N/A 03/24/2023   Procedure: TRANSESOPHAGEAL ECHOCARDIOGRAM;  Surgeon: Hazle Lites, MD;  [facility];  Service: Cardiovascular;  Laterality: N/A;    MEDICATIONS:  HYDROcodone -acetaminophen  (NORCO) 10-325 MG tablet   albuterol  (VENTOLIN  HFA) 108 (90 Base) MCG/ACT inhaler   allopurinol  (ZYLOPRIM ) 100 MG tablet   atorvastatin  (LIPITOR) 20 MG tablet   ciprofloxacin  (CIPRO ) 500 MG tablet   cyclobenzaprine  (FLEXERIL ) 10 MG tablet   diltiazem  (CARDIZEM  CD) 180 MG 24 hr capsule   diltiazem  (CARDIZEM ) 30 MG tablet   doxycycline  (VIBRA -TABS) 100 MG tablet   DULoxetine  (CYMBALTA ) 30 MG capsule   ELIQUIS  5 MG TABS tablet   ferrous gluconate  (FERGON) 324 MG tablet    furosemide  (LASIX ) 40 MG tablet   ipratropium-albuterol  (DUONEB) 0.5-2.5 (3) MG/3ML SOLN   isosorbide  mononitrate (IMDUR ) 30 MG 24 hr tablet   meclizine  (ANTIVERT ) 25 MG tablet   metroNIDAZOLE  (FLAGYL ) 500 MG tablet   Olodaterol HCl (STRIVERDI RESPIMAT) 2.5 MCG/ACT AERS   ondansetron  (ZOFRAN -ODT) 8 MG disintegrating tablet  pantoprazole  (PROTONIX ) 40 MG tablet   predniSONE  (DELTASONE ) 10 MG tablet   pregabalin  (LYRICA ) 100 MG capsule   tamsulosin  (FLOMAX ) 0.4 MG CAPS capsule   No current facility-administered medications for this encounter.      Chick Cotton Ward, PA-C [facility] [phone]

## 2023-10-15 NOTE — Anesthesia Preprocedure Evaluation (Addendum)
 Anesthesia Evaluation  Patient identified by MRN, date of birth, ID band Patient awake    Reviewed: Allergy  & Precautions, NPO status , Patient's Chart, lab work & pertinent test results  History of Anesthesia Complications Negative for: history of anesthetic complications  Airway Mallampati: I  TM Distance: >3 FB Neck ROM: Full    Dental  (+) Dental Advisory Given   Pulmonary neg shortness of breath, asthma , neg sleep apnea, COPD (last used inhaler ~1 week ago),  COPD inhaler, neg recent URI   Pulmonary exam normal breath sounds clear to auscultation       Cardiovascular hypertension (ISMN), Pt. on medications (-) angina +CHF (EF 40-45%)  (-) Past MI, (-) Cardiac Stents and (-) CABG + dysrhythmias (on diltiazem  and Eliquis ) Atrial Fibrillation + Valvular Problems/Murmurs (mild) MR  Rhythm:Regular Rate:Normal  HLD  TEE 03/24/2023: IMPRESSIONS    1. Left ventricular ejection fraction, by estimation, is 40 to 45%. The  left ventricle has mildly decreased function. The left ventricle  demonstrates global hypokinesis. The left ventricular internal cavity size  was mildly dilated.   2. Right ventricular systolic function is normal. The right ventricular  size is normal.   3. No left atrial/left atrial appendage thrombus was detected.   4. The mitral valve is grossly normal. Mild mitral valve regurgitation.   5. The aortic valve is tricuspid. Aortic valve regurgitation trivial to  mild. No aortic stenosis is present.   6. Agitated saline contrast bubble study was negative, with no evidence  of any interatrial shunt.     Neuro/Psych  Headaches, neg Seizures vertigo  Neuromuscular disease (neuropathy, cervical radiculopathy s/p ACDF, lumbar stenosis s/p T12-S1 fusion)    GI/Hepatic Neg liver ROS, hiatal hernia,GERD  Medicated,,Diverticulitis    Endo/Other  neg diabetesHypothyroidism    Renal/GU negative Renal ROS      Musculoskeletal  (+) Arthritis , Osteoarthritis,  Osteoporosis    Abdominal   Peds  Hematology  (+) Blood dyscrasia, anemia Lab Results      Component                Value               Date                      WBC                      12.9 (H)            10/08/2023                HGB                      12.1 (L)            10/08/2023                HCT                      37.0 (L)            10/08/2023                MCV                      98 (H)              10/08/2023  PLT                      145 (L)             10/08/2023              Anesthesia Other Findings Last Eliquis : 10/12/2023  Takes prednisone  10 mg BID, took this morning  Reproductive/Obstetrics                              Anesthesia Physical Anesthesia Plan  ASA: 3  Anesthesia Plan: MAC   Post-op Pain Management: Tylenol  PO (pre-op)*   Induction: Intravenous  PONV Risk Score and Plan: 2 and Ondansetron , Dexamethasone  and Treatment may vary due to age or medical condition  Airway Management Planned: Natural Airway and Simple Face Mask  Additional Equipment:   Intra-op Plan:   Post-operative Plan:   Informed Consent: I have reviewed the patients History and Physical, chart, labs and discussed the procedure including the risks, benefits and alternatives for the proposed anesthesia with the patient or authorized representative who has indicated his/her understanding and acceptance.     Dental advisory given  Plan Discussed with: CRNA and Anesthesiologist  Anesthesia Plan Comments: (Discussed nerve block with patient but he and his wife were worried about him falling afterwards as she did not feel comfortable having to support him with his leg/foot being blocked.  Discussed with patient risks of MAC including, but not limited to, minor pain or discomfort, hearing people in the room, and possible need for backup general anesthesia. Risks for general  anesthesia also discussed including, but not limited to, sore throat, hoarse voice, chipped/damaged teeth, injury to vocal cords, nausea and vomiting, allergic reactions, lung infection, heart attack, stroke, and death. All questions answered.  )         Anesthesia Quick Evaluation

## 2023-10-15 NOTE — Patient Instructions (Addendum)
 SURGICAL WAITING ROOM VISITATION  Patients having surgery or a procedure may have no more than 2 support people in the waiting area - these visitors may rotate.    Children under the age of 51 must have an adult with them who is not the patient.  Due to an increase in RSV and influenza rates and associated hospitalizations, children ages 30 and under may not visit patients in [facility] hospitals.  Visitors with respiratory illnesses are discouraged from visiting and should remain at home.  If the patient needs to stay at the hospital during part of their recovery, the visitor guidelines for inpatient rooms apply. Pre-op nurse will coordinate an appropriate time for 1 support person to accompany patient in pre-op.  This support person may not rotate.    Please refer to the [facility] website for the visitor guidelines for Inpatients (after your surgery is over and you are in a regular room).       Your procedure is scheduled on:  10/16/2023    Report to [facility] Main Entrance    Report to admitting at   1100AM   Call this number if you have problems the morning of surgery [phone]   Do not eat food :After Midnight.   After Midnight you may have the following liquids until __ 1000____ Am  DAY OF SURGERY  Water  Non-Citrus Juices (without pulp, NO RED-Apple, White grape, White cranberry) Black Coffee (NO MILK/CREAM OR CREAMERS, sugar ok)  Clear Tea (NO MILK/CREAM OR CREAMERS, sugar ok) regular and decaf                             Plain Jell-O (NO RED)                                           Fruit ices (not with fruit pulp, NO RED)                                     Popsicles (NO RED)                                                               Sports drinks like Gatorade (NO RED)                             If you have questions, please contact your surgeon's office.     Oral Hygiene is also important to reduce your risk of infection.                                     Remember - BRUSH YOUR TEETH THE MORNING OF SURGERY WITH YOUR REGULAR TOOTHPASTE  DENTURES WILL BE REMOVED PRIOR TO SURGERY PLEASE DO NOT APPLY "Poly grip" OR ADHESIVES!!!   Do NOT smoke after Midnight   Stop all vitamins and herbal supplements 7 days before surgery.   Take these medicines the morning of surgery with  A SIP OF WATER :  Inhalers as usual and bring, nebulzier if needed, prednisone , protonix  cardizem , allopurinol , lyrica  , Imdur    DO NOT TAKE ANY ORAL DIABETIC MEDICATIONS DAY OF YOUR SURGERY  Bring CPAP mask and tubing day of surgery.                              You may not have any metal on your body including hair pins, jewelry, and body piercing             Do not wear make-up, lotions, powders, perfumes/cologne, or deodorant  Do not wear nail polish including gel and S&S, artificial/acrylic nails, or any other type of covering on natural nails including finger and toenails. If you have artificial nails, gel coating, etc. that needs to be removed by a nail salon please have this removed prior to surgery or surgery may need to be canceled/ delayed if the surgeon/ anesthesia feels like they are unable to be safely monitored.   Do not shave  48 hours prior to surgery.               Men may shave face and neck.   Do not bring valuables to the hospital. [facility] IS NOT             RESPONSIBLE   FOR VALUABLES.   Contacts, glasses, dentures or bridgework may not be worn into surgery.   Bring small overnight bag day of surgery.   DO NOT BRING YOUR HOME MEDICATIONS TO THE HOSPITAL. PHARMACY WILL DISPENSE MEDICATIONS LISTED ON YOUR MEDICATION LIST TO YOU DURING YOUR ADMISSION IN THE HOSPITAL!    Patients discharged on the day of surgery will not be allowed to drive home.  Someone NEEDS to stay with you for the first 24 hours after anesthesia.   Special Instructions: Bring a copy of your healthcare power of attorney and living will documents the day of  surgery if you haven't scanned them before.              Please read over the following fact sheets you were given: IF YOU HAVE QUESTIONS ABOUT YOUR PRE-OP INSTRUCTIONS PLEASE CALL [phone]   If you received a COVID test during your pre-op visit  it is requested that you wear a mask when out in public, stay away from anyone that may not be feeling well and notify your surgeon if you develop symptoms. If you test positive for Covid or have been in contact with anyone that has tested positive in the last 10 days please notify you surgeon.    [facility] - Preparing for Surgery Before surgery, you can play an important role.  Because skin is not sterile, your skin needs to be as free of germs as possible.  You can reduce the number of germs on your skin by washing with CHG (chlorahexidine gluconate) soap before surgery.  CHG is an antiseptic cleaner which kills germs and bonds with the skin to continue killing germs even after washing. Please DO NOT use if you have an allergy  to CHG or antibacterial soaps.  If your skin becomes reddened/irritated stop using the CHG and inform your nurse when you arrive at Short Stay. Do not shave (including legs and underarms) for at least 48 hours prior to the first CHG shower.  You may shave your face/neck. Please follow these instructions carefully:  1.  Shower with CHG Soap the night before surgery  and the  morning of Surgery.  2.  If you choose to wash your hair, wash your hair first as usual with your  normal  shampoo.  3.  After you shampoo, rinse your hair and body thoroughly to remove the  shampoo.                           4.  Use CHG as you would any other liquid soap.  You can apply chg directly  to the skin and wash                       Gently with a scrungie or clean washcloth.  5.  Apply the CHG Soap to your body ONLY FROM THE NECK DOWN.   Do not use on face/ open                           Wound or open sores. Avoid contact with eyes, ears mouth and  genitals (private parts).                       Wash face,  Genitals (private parts) with your normal soap.             6.  Wash thoroughly, paying special attention to the area where your surgery  will be performed.  7.  Thoroughly rinse your body with warm water  from the neck down.  8.  DO NOT shower/wash with your normal soap after using and rinsing off  the CHG Soap.                9.  Pat yourself dry with a clean towel.            10.  Wear clean pajamas.            11.  Place clean sheets on your bed the night of your first shower and do not  sleep with pets. Day of Surgery : Do not apply any lotions/deodorants the morning of surgery.  Please wear clean clothes to the hospital/surgery center.  FAILURE TO FOLLOW THESE INSTRUCTIONS MAY RESULT IN THE CANCELLATION OF YOUR SURGERY PATIENT SIGNATURE_________________________________  NURSE SIGNATURE__________________________________  ________________________________________________________________________

## 2023-10-16 ENCOUNTER — Ambulatory Visit (HOSPITAL_COMMUNITY)
Admission: RE | Admit: 2023-10-16 | Discharge: 2023-10-16 | Disposition: A | Discharge status: Home / Self Care | Attending: Podiatry | Admitting: Podiatry

## 2023-10-16 ENCOUNTER — Encounter (HOSPITAL_COMMUNITY): Payer: Self-pay | Admitting: Podiatry

## 2023-10-16 ENCOUNTER — Ambulatory Visit (HOSPITAL_COMMUNITY): Admitting: Physician Assistant

## 2023-10-16 ENCOUNTER — Telehealth: Payer: Self-pay | Admitting: Podiatry

## 2023-10-16 ENCOUNTER — Encounter (HOSPITAL_COMMUNITY)
Admission: RE | Disposition: A | Discharge status: Home / Self Care | Payer: Self-pay | Source: Home / Self Care | Attending: Podiatry

## 2023-10-16 ENCOUNTER — Ambulatory Visit (HOSPITAL_BASED_OUTPATIENT_CLINIC_OR_DEPARTMENT_OTHER): Admitting: Anesthesiology

## 2023-10-16 ENCOUNTER — Other Ambulatory Visit: Payer: Self-pay

## 2023-10-16 ENCOUNTER — Ambulatory Visit: Payer: Medicare Other | Admitting: Internal Medicine

## 2023-10-16 DIAGNOSIS — K449 Diaphragmatic hernia without obstruction or gangrene: Secondary | ICD-10-CM | POA: Diagnosis not present

## 2023-10-16 DIAGNOSIS — M810 Age-related osteoporosis without current pathological fracture: Secondary | ICD-10-CM | POA: Insufficient documentation

## 2023-10-16 DIAGNOSIS — E039 Hypothyroidism, unspecified: Secondary | ICD-10-CM | POA: Insufficient documentation

## 2023-10-16 DIAGNOSIS — I509 Heart failure, unspecified: Secondary | ICD-10-CM | POA: Diagnosis not present

## 2023-10-16 DIAGNOSIS — Z981 Arthrodesis status: Secondary | ICD-10-CM | POA: Diagnosis not present

## 2023-10-16 DIAGNOSIS — K219 Gastro-esophageal reflux disease without esophagitis: Secondary | ICD-10-CM | POA: Diagnosis not present

## 2023-10-16 DIAGNOSIS — Z7951 Long term (current) use of inhaled steroids: Secondary | ICD-10-CM | POA: Diagnosis not present

## 2023-10-16 DIAGNOSIS — J449 Chronic obstructive pulmonary disease, unspecified: Secondary | ICD-10-CM

## 2023-10-16 DIAGNOSIS — I110 Hypertensive heart disease with heart failure: Secondary | ICD-10-CM | POA: Insufficient documentation

## 2023-10-16 DIAGNOSIS — M86171 Other acute osteomyelitis, right ankle and foot: Secondary | ICD-10-CM | POA: Diagnosis not present

## 2023-10-16 DIAGNOSIS — I5022 Chronic systolic (congestive) heart failure: Secondary | ICD-10-CM | POA: Diagnosis not present

## 2023-10-16 DIAGNOSIS — L97512 Non-pressure chronic ulcer of other part of right foot with fat layer exposed: Secondary | ICD-10-CM | POA: Diagnosis not present

## 2023-10-16 DIAGNOSIS — M14671 Charcot's joint, right ankle and foot: Secondary | ICD-10-CM

## 2023-10-16 DIAGNOSIS — I480 Paroxysmal atrial fibrillation: Secondary | ICD-10-CM | POA: Diagnosis not present

## 2023-10-16 DIAGNOSIS — Z01818 Encounter for other preprocedural examination: Secondary | ICD-10-CM

## 2023-10-16 DIAGNOSIS — Z7901 Long term (current) use of anticoagulants: Secondary | ICD-10-CM | POA: Diagnosis not present

## 2023-10-16 DIAGNOSIS — L02611 Cutaneous abscess of right foot: Secondary | ICD-10-CM

## 2023-10-16 DIAGNOSIS — E11610 Type 2 diabetes mellitus with diabetic neuropathic arthropathy: Secondary | ICD-10-CM | POA: Insufficient documentation

## 2023-10-16 DIAGNOSIS — M898X7 Other specified disorders of bone, ankle and foot: Secondary | ICD-10-CM | POA: Diagnosis not present

## 2023-10-16 DIAGNOSIS — Z79899 Other long term (current) drug therapy: Secondary | ICD-10-CM | POA: Insufficient documentation

## 2023-10-16 DIAGNOSIS — E785 Hyperlipidemia, unspecified: Secondary | ICD-10-CM | POA: Insufficient documentation

## 2023-10-16 DIAGNOSIS — I4891 Unspecified atrial fibrillation: Secondary | ICD-10-CM | POA: Insufficient documentation

## 2023-10-16 HISTORY — PX: BONE EXOSTOSIS EXCISION: SHX1249

## 2023-10-16 SURGERY — EXCISION, EXOSTOSIS
Anesthesia: Monitor Anesthesia Care | Laterality: Right

## 2023-10-16 MED ORDER — FENTANYL CITRATE PF 50 MCG/ML IJ SOSY
PREFILLED_SYRINGE | INTRAMUSCULAR | Status: AC
  Filled 2023-10-16: qty 1

## 2023-10-16 MED ORDER — OXYCODONE HCL 5 MG PO TABS
ORAL_TABLET | ORAL | Status: AC
Start: 2023-10-16 — End: ?
  Filled 2023-10-16: qty 1

## 2023-10-16 MED ORDER — SODIUM CHLORIDE 0.9 % IR SOLN
Status: DC | PRN
  Administered 2023-10-16: 1000 mL

## 2023-10-16 MED ORDER — ORAL CARE MOUTH RINSE: 15.0000 mL | Freq: Once | OROMUCOSAL | Status: AC

## 2023-10-16 MED ORDER — FENTANYL CITRATE (PF) 100 MCG/2ML IJ SOLN
INTRAMUSCULAR | Status: DC | PRN
  Administered 2023-10-16 (×2): 25 ug via INTRAVENOUS
  Administered 2023-10-16: 50 ug via INTRAVENOUS

## 2023-10-16 MED ORDER — LIDOCAINE HCL (PF) 2 % IJ SOLN
INTRAMUSCULAR | Status: AC
  Filled 2023-10-16: qty 5

## 2023-10-16 MED ORDER — OXYCODONE-ACETAMINOPHEN 5-325 MG PO TABS: 1.0000 | ORAL_TABLET | ORAL | 0 refills | Status: DC | PRN

## 2023-10-16 MED ORDER — ONDANSETRON HCL 4 MG/2ML IJ SOLN
INTRAMUSCULAR | Status: DC | PRN
  Administered 2023-10-16: 4 mg via INTRAVENOUS

## 2023-10-16 MED ORDER — ACETAMINOPHEN 500 MG PO TABS
1000.0000 mg | ORAL_TABLET | Freq: Once | ORAL | Status: AC
  Administered 2023-10-16: 1000 mg via ORAL
  Filled 2023-10-16: qty 2

## 2023-10-16 MED ORDER — FENTANYL CITRATE PF 50 MCG/ML IJ SOSY
25.0000 ug | PREFILLED_SYRINGE | INTRAMUSCULAR | Status: DC | PRN
  Administered 2023-10-16: 25 ug via INTRAVENOUS
  Administered 2023-10-16: 50 ug via INTRAVENOUS

## 2023-10-16 MED ORDER — LIDOCAINE HCL 1 % IJ SOLN
INTRAMUSCULAR | Status: DC | PRN
  Administered 2023-10-16: 20 mL via INTRAMUSCULAR

## 2023-10-16 MED ORDER — PROPOFOL 500 MG/50ML IV EMUL
INTRAVENOUS | Status: DC | PRN
Start: 2023-10-16 — End: 2023-10-16
  Administered 2023-10-16: 50 ug/kg/min via INTRAVENOUS

## 2023-10-16 MED ORDER — MIDAZOLAM HCL 2 MG/2ML IJ SOLN
INTRAMUSCULAR | Status: AC
  Filled 2023-10-16: qty 2

## 2023-10-16 MED ORDER — OXYCODONE HCL 5 MG PO TABS
5.0000 mg | ORAL_TABLET | Freq: Once | ORAL | Status: AC | PRN
  Administered 2023-10-16: 5 mg via ORAL

## 2023-10-16 MED ORDER — PROPOFOL 10 MG/ML IV BOLUS
INTRAVENOUS | Status: DC | PRN
  Administered 2023-10-16: 20 mg via INTRAVENOUS
  Administered 2023-10-16: 30 mg via INTRAVENOUS
  Administered 2023-10-16 (×4): 20 mg via INTRAVENOUS

## 2023-10-16 MED ORDER — AMISULPRIDE (ANTIEMETIC) 5 MG/2ML IV SOLN: 10.0000 mg | Freq: Once | INTRAVENOUS | Status: DC | PRN

## 2023-10-16 MED ORDER — PROPOFOL 10 MG/ML IV BOLUS
INTRAVENOUS | Status: AC
  Filled 2023-10-16: qty 20

## 2023-10-16 MED ORDER — LIDOCAINE HCL (PF) 1 % IJ SOLN
INTRAMUSCULAR | Status: AC
  Filled 2023-10-16: qty 30

## 2023-10-16 MED ORDER — ROCURONIUM BROMIDE 10 MG/ML (PF) SYRINGE
PREFILLED_SYRINGE | INTRAVENOUS | Status: AC
  Filled 2023-10-16: qty 10

## 2023-10-16 MED ORDER — CHLORHEXIDINE GLUCONATE 0.12 % MT SOLN
15.0000 mL | Freq: Once | OROMUCOSAL | Status: AC
  Administered 2023-10-16: 15 mL via OROMUCOSAL

## 2023-10-16 MED ORDER — BUPIVACAINE HCL (PF) 0.5 % IJ SOLN
INTRAMUSCULAR | Status: AC
  Filled 2023-10-16: qty 30

## 2023-10-16 MED ORDER — LIDOCAINE HCL (PF) 2 % IJ SOLN
INTRAMUSCULAR | Status: DC | PRN
  Administered 2023-10-16: 40 mg via INTRADERMAL

## 2023-10-16 MED ORDER — CEFAZOLIN SODIUM-DEXTROSE 2-4 GM/100ML-% IV SOLN
2.0000 g | INTRAVENOUS | Status: AC
  Administered 2023-10-16: 2 g via INTRAVENOUS
  Filled 2023-10-16: qty 100

## 2023-10-16 MED ORDER — LACTATED RINGERS IV SOLN: INTRAVENOUS | Status: DC

## 2023-10-16 MED ORDER — FENTANYL CITRATE (PF) 100 MCG/2ML IJ SOLN
INTRAMUSCULAR | Status: AC
  Filled 2023-10-16: qty 2

## 2023-10-16 MED ORDER — OXYCODONE HCL 5 MG/5ML PO SOLN: 5.0000 mg | Freq: Once | ORAL | Status: AC | PRN

## 2023-10-16 MED ORDER — ONDANSETRON HCL 4 MG/2ML IJ SOLN
INTRAMUSCULAR | Status: AC
  Filled 2023-10-16: qty 2

## 2023-10-16 SURGICAL SUPPLY — 46 items
BLADE MIC 41X13 (BLADE) IMPLANT
BLADE PRESCISION 7.0X.51X18.5 (BLADE) ×1 IMPLANT
BLADE SURG 15 STRL LF DISP TIS (BLADE) IMPLANT
BNDG COMPR ESMARK 4X3 LF (GAUZE/BANDAGES/DRESSINGS) IMPLANT
BNDG ELASTIC 3INX 5YD STR LF (GAUZE/BANDAGES/DRESSINGS) ×1 IMPLANT
BNDG ELASTIC 4INX 5YD STR LF (GAUZE/BANDAGES/DRESSINGS) IMPLANT
BNDG ELASTIC 6INX 5YD STR LF (GAUZE/BANDAGES/DRESSINGS) ×1 IMPLANT
BNDG GAUZE DERMACEA FLUFF 4 (GAUZE/BANDAGES/DRESSINGS) ×1 IMPLANT
CHLORAPREP W/TINT 26 (MISCELLANEOUS) ×1 IMPLANT
CLOTH BEACON ORANGE TIMEOUT ST (SAFETY) ×1 IMPLANT
CNTNR URN SCR LID CUP LEK RST (MISCELLANEOUS) IMPLANT
COVER SURGICAL LIGHT HANDLE (MISCELLANEOUS) ×2 IMPLANT
CUFF TOURN SGL QUICK 18 (TOURNIQUET CUFF) ×1 IMPLANT
CUFF TOURN SGL QUICK 18X4 (TOURNIQUET CUFF) IMPLANT
DRAPE OEC MINIVIEW 54X84 (DRAPES) IMPLANT
DRSG EMULSION OIL 3X3 NADH (GAUZE/BANDAGES/DRESSINGS) IMPLANT
ELECT REM PT RETURN 15FT ADLT (MISCELLANEOUS) ×1 IMPLANT
GAUZE 4X4 16PLY ~~LOC~~+RFID DBL (SPONGE) IMPLANT
GAUZE PAD ABD 8X10 STRL (GAUZE/BANDAGES/DRESSINGS) IMPLANT
GAUZE SPONGE 4X4 12PLY STRL (GAUZE/BANDAGES/DRESSINGS) ×1 IMPLANT
GAUZE XEROFORM 5X9 LF (GAUZE/BANDAGES/DRESSINGS) ×1 IMPLANT
GLOVE BIO SURGEON STRL SZ8 (GLOVE) ×1 IMPLANT
GLOVE BIOGEL PI IND STRL 8 (GLOVE) ×1 IMPLANT
GOWN STRL REUS W/ TWL XL LVL3 (GOWN DISPOSABLE) ×1 IMPLANT
GRAFT SKIN WND MICRO 38 (Tissue) IMPLANT
HEMOSTAT SURGICEL 4X8 (HEMOSTASIS) IMPLANT
KIT BASIN OR (CUSTOM PROCEDURE TRAY) ×1 IMPLANT
MANIFOLD NEPTUNE II (INSTRUMENTS) ×1 IMPLANT
NDL HYPO 25X1 1.5 SAFETY (NEEDLE) ×1 IMPLANT
NDL SAFETY ECLIPSE 18X1.5 (NEEDLE) IMPLANT
NEEDLE HYPO 25X1 1.5 SAFETY (NEEDLE) ×3 IMPLANT
NS IRRIG 1000ML POUR BTL (IV SOLUTION) ×1 IMPLANT
PACK ORTHO EXTREMITY (CUSTOM PROCEDURE TRAY) ×1 IMPLANT
PACKING GAUZE IODOFORM 1INX5YD (GAUZE/BANDAGES/DRESSINGS) IMPLANT
SET HNDPC FAN SPRY TIP SCT (DISPOSABLE) IMPLANT
SOL PREP POV-IOD 4OZ 10% (MISCELLANEOUS) ×1 IMPLANT
STAPLER SKIN PROX 35W (STAPLE) IMPLANT
SUT PROLENE 3 0 PS 2 (SUTURE) ×1 IMPLANT
SUT PROLENE 4 0 PS 2 18 (SUTURE) ×1 IMPLANT
SUT VIC AB 3-0 PS2 18XBRD (SUTURE) IMPLANT
SUT VIC AB 4-0 PS2 18 (SUTURE) ×1 IMPLANT
SUT VIC AB 4-0 PS2 27 (SUTURE) ×1 IMPLANT
SWAB CULTURE ESWAB REG 1ML (MISCELLANEOUS) IMPLANT
SYR 10ML LL (SYRINGE) IMPLANT
SYR CONTROL 10ML LL (SYRINGE) ×1 IMPLANT
TUBE SUCTION HIGH CAP CLEAR NV (SUCTIONS) IMPLANT
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
## 2023-10-16 NOTE — Interval H&P Note (Signed)
 History and Physical Interval Note:  10/16/2023 1:44 PM  Levi [name]  has presented today for surgery, with the diagnosis of Charcot's joint of ankle, right Abscess of right foot Right foot ulcer, with fat layer exposed.  The various methods of treatment have been discussed with the patient and family. After consideration of risks, benefits and other options for treatment, the patient has consented to  Procedure(s): EXCISION, EXOSTOSIS (Right) as a surgical intervention.  The patient's history has been reviewed, patient examined, no change in status, stable for surgery.  I have reviewed the patient's chart and labs.  Questions were answered to the patient's satisfaction.     Dot Gazella

## 2023-10-16 NOTE — Anesthesia Postprocedure Evaluation (Signed)
 Anesthesia Post Note  Patient: Levi [name]  Procedure(s) Performed: EXCISION, EXOSTOSIS (Right)     Patient location during evaluation: PACU Anesthesia Type: MAC Level of consciousness: awake Pain management: pain level controlled Vital Signs Assessment: post-procedure vital signs reviewed and stable Respiratory status: spontaneous breathing, nonlabored ventilation and respiratory function stable Cardiovascular status: stable and blood pressure returned to baseline Postop Assessment: no apparent nausea or vomiting Anesthetic complications: no   No notable events documented.  Last Vitals:  Vitals:   10/16/23 1515 10/16/23 1530  BP: 121/89 (!) 139/93  Pulse: 84 86  Resp: 16 17  Temp:    SpO2: 98% 96%    Last Pain:  Vitals:   10/16/23 1530  TempSrc:   PainSc: 5         RLE Motor Response: Purposeful movement (10/16/23 1530)        Conard Decent

## 2023-10-16 NOTE — Anesthesia Procedure Notes (Signed)
 Procedure Name: MAC Date/Time: 10/16/2023 1:49 PM  Performed by: Ulanda Gambles, CRNAPre-anesthesia Checklist: Patient identified, Emergency Drugs available, Suction available and Patient being monitored Oxygen Delivery Method: Simple face mask

## 2023-10-16 NOTE — Progress Notes (Signed)
 1146 On Eliquis .

## 2023-10-16 NOTE — Op Note (Signed)
 OPERATIVE REPORT Patient name: Levi [name] MRN: [id number] DOB: 02/16/1946  DOS: 10/16/23  Preop Dx: Abscess right foot.  Exostosis of bone right foot Postop Dx: same  Procedure:  1.  Incision and drainage right foot 2.  Plantar planing/exostectomy right foot 3.  Bone biopsy right foot  Surgeon: Dot Gazella DPM  Anesthesia: 50-50 mixture of 2% lidocaine  plain with 0.5% Marcaine  plain totaling 20 mL infiltrated in the patient's right lower extremity ankle block fashion  Hemostasis: Calf tourniquet inflated to a pressure of after esmarch exsanguination   EBL: 50 mL Materials: Kerecis Surgibind Granules Injectables: 10 mL of 1% lidocaine  plain Pathology: Soft tissue culture sent for aerobic, anaerobic, and Gram stain.  Bone biopsy plantar tarsometatarsal sent for both culture and path  Condition: The patient tolerated the procedure and anesthesia well. No complications noted or reported   Justification for procedure: The patient is a 78 y.o. male who presents today for surgical correction of abscess to the right foot with concern for possible osteomyelitis. All conservative modalities of been unsuccessful in providing any sort of satisfactory alleviation of symptoms with the patient. The patient was told benefits as well as possible side effects of the surgery. The patient consented for surgical correction. The patient consent form was reviewed. All patient questions were answered. No guarantees were expressed or implied. The patient and the surgeon both signed the patient consent form with the witness present and placed in the patient's chart.   Procedure in Detail: The patient was brought to the operating room, placed in the operating table in the supine position at which time an aseptic scrub and drape were performed about the patient's respective lower extremity after anesthesia was induced as described above. Attention was then directed to the surgical area where  procedure number one commenced.  Procedure #1: Incision and drainage right foot abscess The plantar ulcer to the right foot measuring approximately 1.0 x 1.0 cm was perforated using #15 scalpel and any hyperkeratotic callus tissue around the area was excisionally debrided.  Blunt soft tissue dissection was then utilized using a curved mosquito to perforate any abscesses within the deep compartments of the foot.  Heavy sanguinous drainage noted.  No purulence.  The abscess extended down to the level of bone and hypertrophic exostosis to the plantar aspect of the foot creating underlying pressure to the overlying wound and abscess.  Deep soft tissue cultures were taken and sent to pathology for culture and sensitivity.  Of note the patient has been on ciprofloxacin  500 mg as well as metronidazole  500 mg twice daily for approximately 1 week.  After appropriate dissection of the abscess copious irrigation was utilized using a combination of pulse lavage and bulb syringe.  Any necrotic tissue within the abscess was excisionally removed using rongeur.  After appropriate irrigation attention was then directed to the lateral aspect of the foot where procedure #2 commenced  Proceed #2: Exostectomy/partial excision of foot bone plantar aspect tarsometatarsal joint right foot A 4 cm linear longitudinal skin incision was planned and made overlying the lateral aspect of the foot just lateral to the abscess and ulcer.  Incision was carried down to the level of bone using a #15 scalpel.  Again, no purulence was noted.  The hypertrophic portion of bone and exostosis to the plantar aspect of the foot was identified and a sagittal plane sagittal saw was utilized to resect away any hypertrophic portion of bone creating underlying pressure to the overlying wound and  abscess; essentially planing of the plantar aspect of the foot.  After excision of the hypertrophic portion of bone the surgical site was evaluated and only smooth  planed osseous structure remained to the underlying portion of the wound.  Copious irrigation was then again utilized in preparation for routine layered primary closure. Prior to closure, since the patient is on anticoagulant, I did pack a small amount of absorbable Surgicel within the lateral incision site in combination with Kerecis granules to increase the potential for healing.  Procedure #3: Bone biopsy right foot The portion of the bone that was removed from the plantar aspect of the foot was harvested and 2 separate specimens were sent.  1 specimen was sent for culture and the other for path.  Both were placed in a sterile specimen container.  Routine layered primary closure to the lateral incision site was achieved using a combination of 3-0 Vicryl suture and skin staples.  The plantar abscess was packed with 1 inch iodoform packing.  Dry sterile compressive dressings were then applied to all previously mentioned incision sites about the patient's lower extremity. The tourniquet which was used for hemostasis was deflated. All normal neurovascular responses including pink color and warmth returned all the digits of patient's lower extremity.  The patient was then transferred from the operating room to the recovery room having tolerated the procedure and anesthesia well. All vital signs are stable. After a brief stay in the recovery room the patient was discharged with postoperative orders placed.    IMPRESSION: No appreciable purulence intraoperatively.  Bone appears to be healthy and viable.  Dot Gazella, DPM [facility]  Dr. Dot Gazella, DPM    [address] [place], [place] [address]                Office [phone]  Fax [phone]

## 2023-10-16 NOTE — Telephone Encounter (Signed)
 I called the pcp office to make sure they got the fax for the h & p form and the assistant called back stating the provider states that the surgeon does the h & p not them.  I did explain to her that the hospital requires the h & p from the pcp.   Then Dr Lamon Pillow called me directly stated that the surgeon does the h&p and that all his notes are in epic and can be seen.  I explained to him that the hospital requires the pcp to do them and he said that its the surgeon's that have to do them.  I did tell him that I have been discussing with the nurse from the hospital and that is who is requesting the h & p from the pcp.

## 2023-10-16 NOTE — Transfer of Care (Signed)
 Immediate Anesthesia Transfer of Care Note  Patient: Levi [name]  Procedure(s) Performed: EXCISION, EXOSTOSIS (Right)  Patient Location: PACU  Anesthesia Type:MAC  Level of Consciousness: awake, alert , and oriented  Airway & Oxygen Therapy: Patient Spontanous Breathing and Patient connected to face mask oxygen  Post-op Assessment: Report given to RN, Post -op Vital signs reviewed and stable, and Patient moving all extremities X 4  Post vital signs: Reviewed and stable  Last Vitals:  Vitals Value Taken Time  BP 122/81 10/16/23 1505  Temp    Pulse 83 10/16/23 1506  Resp 11 10/16/23 1506  SpO2 100 % 10/16/23 1506  Vitals shown include unfiled device data.  Last Pain:  Vitals:   10/16/23 1130  TempSrc: Oral         Complications: No notable events documented.

## 2023-10-16 NOTE — Discharge Instructions (Signed)
 Leave dressings clean and dry. MINIMAL weight bearing in the surgical shoe. Elevate foot at or above the level of the heart. Ice 26min/hr.

## 2023-10-16 NOTE — Brief Op Note (Signed)
 10/16/2023  3:05 PM  PATIENT:  Levi [name]  78 y.o. male  PRE-OPERATIVE DIAGNOSIS:  Exostosis right foot lisfranc. Abscess right foot.   POST-OPERATIVE DIAGNOSIS:  Same  PROCEDURE:   1.  Exostectomy right foot TMT plantar 2.  Bone biopsy right foot metatarsal  3.  Incision and drainage right foot  SURGEON:  Surgeons and Role:    Dot Gazella, DPM - Primary  PHYSICIAN ASSISTANT: None  ASSISTANTS: none   ANESTHESIA:   local and IV sedation  EBL: 50 mL  BLOOD ADMINISTERED: None  DRAINS: none   LOCAL MEDICATIONS USED:  MARCAINE    , LIDOCAINE  , and Amount: 20 ml  SPECIMEN:  Source of Specimen:  Bone biopsy plantar tarsometatarsal right foot sent for BOTH path and culture  DISPOSITION OF SPECIMEN:  PATHOLOGY  COUNTS:  YES  TOURNIQUET:   Total Tourniquet Time Documented: Calf (Right) - 51 minutes Total: Calf (Right) - 51 minutes   DICTATION: .Dotti Gear Dictation  PLAN OF CARE: Discharge to home after PACU  PATIENT DISPOSITION:  PACU - hemodynamically stable.   Delay start of Pharmacological VTE agent (>24hrs) due to surgical blood loss or risk of bleeding: no  Dot Gazella, DPM [facility]  Dr. Dot Gazella, DPM    [address] [place], [place] [address]                Office [phone]  Fax [phone]

## 2023-10-17 ENCOUNTER — Encounter (HOSPITAL_COMMUNITY): Payer: Self-pay | Admitting: Podiatry

## 2023-10-21 ENCOUNTER — Ambulatory Visit: Payer: Medicare Other | Attending: Cardiology | Admitting: Cardiology

## 2023-10-21 ENCOUNTER — Encounter: Payer: Self-pay | Admitting: Cardiology

## 2023-10-21 VITALS — BP 114/72 | HR 109 | Ht 74.0 in

## 2023-10-21 DIAGNOSIS — I872 Venous insufficiency (chronic) (peripheral): Secondary | ICD-10-CM | POA: Diagnosis not present

## 2023-10-21 DIAGNOSIS — I5022 Chronic systolic (congestive) heart failure: Secondary | ICD-10-CM

## 2023-10-21 DIAGNOSIS — I480 Paroxysmal atrial fibrillation: Secondary | ICD-10-CM

## 2023-10-21 LAB — AEROBIC/ANAEROBIC CULTURE W GRAM STAIN (SURGICAL/DEEP WOUND)
Culture: NO GROWTH
Gram Stain: NONE SEEN
Gram Stain: NONE SEEN

## 2023-10-21 LAB — SURGICAL PATHOLOGY

## 2023-10-21 NOTE — Patient Instructions (Signed)
 Medication Instructions:  The current medical regimen is effective;  continue present plan and medications.  *If you need a refill on your cardiac medications before your next appointment, please call your pharmacy*  Follow-Up: At [facility], you and your health needs are our priority.  As part of our continuing mission to provide you with exceptional heart care, our providers are all part of one team.  This team includes your primary Cardiologist (physician) and Advanced Practice Providers or APPs (Physician Assistants and Nurse Practitioners) who all work together to provide you with the care you need, when you need it.  Your next appointment:   1 year(s)  Provider:   Donato Schultz, MD    We recommend signing up for the patient portal called "MyChart".  Sign up information is provided on this After Visit Summary.  MyChart is used to connect with patients for Virtual Visits (Telemedicine).  Patients are able to view lab/test results, encounter notes, upcoming appointments, etc.  Non-urgent messages can be sent to your provider as well.   To learn more about what you can do with MyChart, go to [email or web address].

## 2023-10-21 NOTE — Progress Notes (Signed)
 Cardiology Office Note:  .   Date:  10/21/2023  ID:  Levi [name], DOB 09/21/1945, MRN [id number] PCP: Gean Keels, MD  [facility] Providers Cardiologist:  Dorothye Gathers, MD Electrophysiologist:  Boyce Byes, MD     History of Present Illness: .   Levi [name] is a 78 y.o. male Discussed the use of AI scribe software for clinical note transcription with the patient, who gave verbal consent to proceed.  History of Present Illness Levi [name] "Levi [name]" is a 78 year old male who presents for follow-up after recent foot surgery.  He recently underwent a partial excision of bone and biopsy of the right foot. He is currently in a wheelchair with a boot in place. The surgery addressed an abscess on the pressure point of the foot and involved shaving off some bone. Post-surgery, he experiences ongoing peeling and swelling of the foot, which he attributes to his Charcot foot condition.  He has a history of atrial fibrillation and previously attempted to have a Watchman device implanted, which was unsuccessful due to a redundant atrial septum. He experiences episodes of atrial fibrillation more frequently, though they are brief. He takes apixaban  for anticoagulation to prevent stroke.  He experiences chest pains that radiate, which he associates with a past fundoplication surgery. He also mentions dizziness and fainting feelings, which he attributes to his medications. He has a history of low blood pressure, with a noted episode of 80/40 mmHg while on candesartan , which he has since discontinued.  He has chronic issues with prednisone  use, neuropathy, broken ribs, hips, and weight gain. He has been on prednisone  20 mg daily for approximately 15 years, contributing to swelling and weight gain. He takes furosemide  40 mg as needed, atorvastatin  20 mg daily, and diltiazem  180 mg daily. He also uses isosorbide . He coordinates furosemide  use with his daily activities to manage fluid  retention.  He has a history of venous insufficiency and reports leg swelling, which has worsened post-surgery. He attempts to manage this by elevating his legs.  No fainting but experiences dizziness, which he attributes to his medications. He denies any recent falls but mentions a past incident where he required assistance after a fall.      Studies Reviewed: .        Results LABS Cholesterol: 82 mg/dL  RADIOLOGY Coronary CT: Calcium  score 197, 50th percentile (2022)  DIAGNOSTIC Echocardiogram: Ejection Fraction (EF) 40-45%, negative bubble study, no evidence of endocarditis (03/24/2023) Risk Assessment/Calculations:    CHA2DS2-VASc Score = 4   This indicates a 4.8% annual risk of stroke. The patient's score is based upon: CHF History: 1 HTN History: 1 Diabetes History: 0 Stroke History: 0 Vascular Disease History: 0 Age Score: 2 Gender Score: 0            Physical Exam:   VS:  BP 114/72   Pulse (!) 109   Ht 6\' 2"  (1.88 m)   SpO2 96%   BMI 26.32 kg/m    Wt Readings from Last 3 Encounters:  10/16/23 205 lb (93 kg)  08/21/23 203 lb 7.7 oz (92.3 kg)  08/14/23 203 lb 7.8 oz (92.3 kg)    GEN: Well nourished, well developed in no acute distress NECK: No JVD; No carotid bruits CARDIAC: Tachy reg, no murmurs, no rubs, no gallops RESPIRATORY:  Clear to auscultation without rales, wheezing or rhonchi  ABDOMEN: Soft, non-tender, non-distended EXTREMITIES:  No edema; No deformity   ASSESSMENT AND PLAN: .  Assessment and Plan Assessment & Plan Paroxysmal atrial fibrillation/ flutter Intermittent episodes of atrial fibrillation, more frequent but short-lived. Possible vagal maneuvers like drinking cold water  may help terminate episodes. Continued anticoagulation with Eliquis  is important to prevent stroke. - Continue Eliquis  for stroke prevention. - Consider vagal maneuvers like drinking cold water  for terminating episodes. - Last ECG sinus tach 105. Similar  today.   Heart failure with reduced ejection fraction Ejection fraction of 40-45% on echocardiogram. Diltiazem  is used for rate control, and metoprolol  was considered but not used due to asthma. - Continue diltiazem  CD 180 mg daily.  Coronary artery disease with coronary plaque Coronary artery disease with a calcium  score of 197 in 2022. Atorvastatin  is used to manage cholesterol levels, with a current LDL of 82, close to the goal of 70. - Continue atorvastatin  20 mg daily.  Lower extremity edema Increased swelling in the legs, likely exacerbated by recent foot surgery and chronic venous insufficiency. Furosemide  is used to manage fluid retention, but timing of doses is important to avoid nocturia. - Continue furosemide  40 mg daily, coordinate dosing with daily activities. - Elevate legs to reduce swelling.  Venous insufficiency (chronic) Chronic venous insufficiency contributing to lower extremity edema. Managed with leg elevation and diuretics.  Charcot foot Recent partial excision of bone and biopsy of right foot due to Charcot foot and abscess. Post-surgical inflammation and healing process ongoing.  Neuropathy Neuropathy contributing to Charcot foot and possibly related to chronic prednisone  use.  Chronic prednisone  use Long-term use of prednisone  at 20 mg daily, contributing to weight gain and potential bone issues. Does not help with swelling.         Dispo: 1 yr  Signed, Dorothye Gathers, MD

## 2023-10-22 ENCOUNTER — Telehealth: Payer: Self-pay | Admitting: Podiatry

## 2023-10-22 ENCOUNTER — Ambulatory Visit (INDEPENDENT_AMBULATORY_CARE_PROVIDER_SITE_OTHER): Admitting: Podiatry

## 2023-10-22 ENCOUNTER — Ambulatory Visit (INDEPENDENT_AMBULATORY_CARE_PROVIDER_SITE_OTHER)

## 2023-10-22 DIAGNOSIS — L97512 Non-pressure chronic ulcer of other part of right foot with fat layer exposed: Secondary | ICD-10-CM

## 2023-10-22 MED ORDER — CIPROFLOXACIN HCL 500 MG PO TABS: 500.0000 mg | ORAL_TABLET | Freq: Two times a day (BID) | ORAL | 0 refills | Status: AC

## 2023-10-22 NOTE — Telephone Encounter (Signed)
 Scheduled patient with Dr. Luster Salters today, 10/22/2023 @ 3:45pm

## 2023-10-22 NOTE — Telephone Encounter (Signed)
 Patient spouse called, requesting clarification on test results seen on MyChart and noticed odor from bandage on foot. Patient would like for nurse to contact as soon as possible. Patient recently had surgery

## 2023-10-22 NOTE — Progress Notes (Signed)
 Chief Complaint  Patient presents with   Post-op Problem    RM 6 POV 10/16/2023/ODOR COMING FROM SURGERY FOOT    Subjective:  Patient presents today status post incision and drainage of the right plantar foot with planning exostectomy.  DOS: 10/16/2023.  Doing well.  There is some bleeding through the gauze wrap which is somewhat expected given the patient's anticoagulant.  Currently taking ciprofloxacin  as well as metronidazole .  WBAT surgical shoe as instructed  Past Medical History:  Diagnosis Date   Agent orange exposure 1970's   takes Imdur ,Diltiazem , and Atacand  daily   Agent orange exposure    ALLERGIC RHINITIS    Allergy     seasonal allergies   Anemia    iron  deficiency   Arthritis    Asthma    uses inhaler and nebulizers   Atrial fibrillation (HCC)    Bruises easily    d/t prednisone  daily   Cataract    Cellulitis 03/22/2020   Chest wall pain 09/21/2019   L scapula, reported 4/27 21/     CHF (congestive heart failure) (HCC)    dx-on meds to treat   Chronic bronchitis (HCC)    "used to get it q yr; last time ?2008" (04/28/2012)   Clotting disorder (HCC) 2019   On blood thinner when I bleed it is for a consideral amount of time. This is tegardless of size of wound.   COPD (chronic obstructive pulmonary disease) (HCC)    agent orange exposure   Degenerative disk disease    "everywhere" (04/28/2012)   Diverticulitis    Dysrhythmia    afib   Elevated uric acid in blood    takes Allopurinol  daily   Emphysema of lung (HCC)    Enlarged prostate    but not on any meds   GERD (gastroesophageal reflux disease)    takes Protonix  daily   H/O hiatal hernia    Headache    History of colonoscopy    History of pneumonia    a. 2010   Hyperlipidemia    takes Lipitor daily; pt. states he takes a preventive   Hypothyroidism    Joint pain    Joint swelling    Leukocytosis    MSSA bacteremia 03/19/2023   Nausea and vomiting 11/12/2022   Neuromuscular disorder (HCC)     bilat legs, and bilat arms   Neuropathy    Osteoporosis 2012   PAF (paroxysmal atrial fibrillation) (HCC)    a. Dx 04/2012, CHA2DS2VASc = 1 (age);  b. 04/2012 Echo: EF 40-50%, mild MR.   Personal history of colonic adenomas 02/09/2013   Pneumonia    Sepsis (HCC) 03/18/2023   Shortness of breath dyspnea    Thyroid  disease    no taking meds-caused dizziness    Past Surgical History:  Procedure Laterality Date   25 HOUR PH STUDY N/A 05/30/2021   Procedure: 24 HOUR PH STUDY;  Surgeon: Annis Kinder, DO;  Location: [facility];  Service: Gastroenterology;  Laterality: N/A;   AMPUTATION TOE Bilateral 08/13/2023   Procedure: LEFT SECOND TOE AMPUTATION AND RIGHT FIFTH TOE AMPUTATION;  Surgeon: Evertt Hoe, DPM;  Location: [facility];  Service: Orthopedics/Podiatry;  Laterality: Bilateral;   ANTERIOR CERVICAL DECOMP/DISCECTOMY FUSION N/A 07/21/2012   Procedure: ANTERIOR CERVICAL DECOMPRESSION/DISCECTOMY FUSION 2 LEVELS;  Surgeon: Elna Haggis, MD;  Location: [facility];  Service: Neurosurgery;  Laterality: N/A;  C4-5 C5-6 Anterior cervical decompression/diskectomy/fusion   ANTERIOR LAT LUMBAR FUSION N/A 04/07/2017   Procedure: Thoracic twelve-Lumbar one  Anterolateral decompression/fusion;  Surgeon: Elna Haggis, MD;  Location: [facility] OR;  Service: Neurosurgery;  Laterality: N/A;   ARTHRODESIS FOOT WITH WEIL OSTEOTOMY Left 11/07/2022   Procedure: LEFT THIRD METATARSOPHALANGEAL CAPSULOTOMY, WEIL OSTEOTOMY;  Surgeon: Donnamarie Gables, MD;  Location: [facility] OR;  Service: Orthopedics;  Laterality: Left;  LENGTH OF SURGERY: 60 MINUTES   BACK SURGERY     x 3   BONE EXOSTOSIS EXCISION Right 10/16/2023   Procedure: EXCISION, EXOSTOSIS;  Surgeon: Dot Gazella, DPM;  Location: [facility];  Service: Orthopedics/Podiatry;  Laterality: Right;   CARDIAC CATHETERIZATION  11/2008   Dr. Renna Cary - 20% calcified non flow limiting left main, 50% EF apical hypokinesis   CARPAL TUNNEL RELEASE Bilateral     CERVICAL DISC ARTHROPLASTY N/A 04/07/2017   Procedure: Cervical six-seven Disc arthroplasty;  Surgeon: Elna Haggis, MD;  Location: [facility] OR;  Service: Neurosurgery;  Laterality: N/A;   CHEST TUBE INSERTION Left 04/11/2017   Procedure: CHEST TUBE INSERTION;  Surgeon: Heriberto London, MD;  Location: [facility] OR;  Service: Thoracic;  Laterality: Left;   CHOLECYSTECTOMY     COLONOSCOPY  2016   CG-MAC-miralax (good)servere TICS/TA x 2   ESOPHAGEAL MANOMETRY N/A 05/30/2021   Procedure: ESOPHAGEAL MANOMETRY (EM);  Surgeon: Annis Kinder, DO;  Location: [facility];  Service: Gastroenterology;  Laterality: N/A;  ph impedence   ESOPHAGOGASTRODUODENOSCOPY     EYE SURGERY Bilateral 2011   steroidal encapsulation" (04/28/2012)   FOOT SURGERY Right    x 2   JOINT REPLACEMENT     Many surgeries including 5 joit replacements   KNEE ARTHROSCOPY Bilateral    LATERAL / POSTERIOR COMBINED FUSION LUMBAR SPINE  2012   LEFT ATRIAL APPENDAGE OCCLUSION N/A 02/22/2021   Procedure: LEFT ATRIAL APPENDAGE OCCLUSION;  Surgeon: Boyce Byes, MD;  [facility];  Service: Cardiovascular;  Laterality: N/A;   NEUROPLASTY / TRANSPOSITION ULNAR NERVE AT ELBOW Bilateral    POLYPECTOMY  2016   severe TICS/TA x 2   POSTERIOR CERVICAL LAMINECTOMY WITH MET- RX Right 06/11/2021   Procedure: Right Cervical six-seven  Laminectomy and foraminotomy with metrex;  Surgeon: Elna Haggis, MD;  Location: [facility];  Service: Neurosurgery;  Laterality: Right;   REFRACTIVE SURGERY Left 07/07/2023   laser surgery but still has "floaters"   REVERSE SHOULDER ARTHROPLASTY  04/28/2012   Procedure: REVERSE SHOULDER ARTHROPLASTY;  Surgeon: Derald Flattery, MD;  Location: [facility] OR;  Service: Orthopedics;  Laterality: Left;  Left shouder reverse total shoulder arthroplasty   SHOULDER SURGERY     SPINE SURGERY     I have had 7 spinal surgeries   TEE WITHOUT CARDIOVERSION N/A 02/22/2021   Procedure: TRANSESOPHAGEAL  ECHOCARDIOGRAM (TEE);  Surgeon: Boyce Byes, MD;  Location: [facility] INVASIVE CV LAB;  Service: Cardiovascular;  Laterality: N/A;   TENDON REPAIR Right 08/07/2020   Procedure: RIGHT HAND LIGAMENT RECONSTRUCTION AND TENDON INTERPOSITION;  Surgeon: Neil Balls, MD;  Location: [facility];  Service: Orthopedics;  Laterality: Right;   TONSILLECTOMY AND ADENOIDECTOMY  1954   TOTAL HIP ARTHROPLASTY Left 2011   "left" (04/28/2012)   TOTAL HIP ARTHROPLASTY Right 07/31/2015   Procedure: TOTAL HIP ARTHROPLASTY ANTERIOR APPROACH;  Surgeon: Neil Balls, MD;  [facility];  Service: Orthopedics;  Laterality: Right;   TOTAL KNEE ARTHROPLASTY  01/10/2012   Procedure: TOTAL KNEE ARTHROPLASTY;  Surgeon: Boston Byers, MD;  Location: [facility];  Service: Orthopedics;;  left total knee arthroplasty   TOTAL KNEE ARTHROPLASTY Right 06/17/2014  Procedure: TOTAL KNEE ARTHROPLASTY;  Surgeon: Boston Byers, MD;  Location: [facility];  Service: Orthopedics;  Laterality: Right;   toupet fundolplication  11/01/2021   TRANSESOPHAGEAL ECHOCARDIOGRAM (CATH LAB) N/A 03/24/2023   Procedure: TRANSESOPHAGEAL ECHOCARDIOGRAM;  Surgeon: Hazle Lites, MD;  [facility]: [facility];  Service: Cardiovascular;  Laterality: N/A;    Allergies  Allergen Reactions   Levaquin  [Levofloxacin ] Itching    Tolerated ciprofloxacin  in 2022.    Objective/Physical Exam Neurovascular status intact.  Slight maceration noted to the lateral incision site but overall it appears stable and intact.  Staples intact.  No malodor.  No purulence.  Iodoform packing is intact plantar foot wound which was removed today  Radiographic Exam RT foot 10/22/2023:  Unchanged.  Charcot neuroarthropathy  Surgical pathology Order: [id number]  Status: Edited Result - FINAL     Next appt: 10/23/2023 at 08:00 AM in Family [facility] WELLNESS VISIT)   Test Result Released: Yes (seen)   0 Result Notes    Component Ref Range & Units  (hover) 6 d ago  SURGICAL PATHOLOGY SURGICAL PATHOLOGY CASE: [id number] PATIENT: Levi [name] Surgical Pathology Report     Clinical History: Charcot's joint ankle, right, abscess of right foot, right foot ulcer, with fat layer exposed     FINAL MICROSCOPIC DIAGNOSIS:  A. MIDTARSAL BONE, BIOPSY: Reactive cortical and trabecular bone showing acute osteomyelitis   GROSS DESCRIPTION:  Received fresh is a 1.2 x 1.1 x 0.3 cm portion of tan-brown bone.  The specimen is submitted in toto following decalcification.  (GRP 10/17/2023)     Component Ref Range & Units (hover) 6 d ago  Specimen Description BONE RIGHT FOOT Performed at [facility], [address] [place], [place] [address]  Special Requests MIDTARSAL  Gram Stain NO WBC SEEN NO ORGANISMS SEEN  Culture No growth aerobically or anaerobically. Performed at [facility], [address] [place], [place] [address]  Report Status 10/21/2023 FINAL   Component Ref Range & Units (hover) 6 d ago  Specimen Description ABSCESS RIGHT FOOT Performed at [facility], [address] [place], [place] [address]  Special Requests SWABS  Gram Stain NO WBC SEEN NO ORGANISMS SEEN  Culture RARE STAPHYLOCOCCUS COHNII NO ANAEROBES ISOLATED Performed at [facility], [address], [place], [place] [address]  Report Status 10/21/2023 FINAL  Organism ID, Bacteria STAPHYLOCOCCUS COHNII  Resulting Agency [facility]     Susceptibility   Staphylococcus cohnii    MIC    CIPROFLOXACIN  <=0.5 SENSI... Sensitive    CLINDAMYCIN 0.5 SENSITIVE Sensitive    ERYTHROMYCIN >=8 RESISTANT Resistant    GENTAMICIN <=0.5 SENSI... Sensitive    Inducible Clindamycin NEGATIVE Sensitive    OXACILLIN >=4 RESISTANT Resistant    RIFAMPIN <=0.5 SENSI... Sensitive    TETRACYCLINE >=16 RESIST... Resistant    TRIMETH /SULFA  40 SENSITIVE Sensitive    VANCOMYCIN  1 SENSITIVE Sensitive            Susceptibility Comments  Staphylococcus cohnii  RARE STAPHYLOCOCCUS COHNII      Assessment: 1. s/p incision and drainage right foot abscess with exostectomy and bone biopsy right foot. DOS: 10/16/2023   Plan of Care:  -Patient was evaluated. X-rays reviewed - Pathology reviewed today.  Bone culture negative for growth.  Pathology concerning for acute osteomyelitis however the patient has a history of Charcot neuroarthropathy which can present histopathologically similar.  Will observe for now -Refill prescription for ciprofloxacin  500 mg twice daily  x 10 days based on culture from the abscess -Dressings changed.  Clean dry and intact x 1 week -Return to clinic 1 week   Dot Gazella, DPM [facility]  Dr. Dot Gazella, DPM    [address] [place], [place] [address]                Office [phone]  Fax [phone]

## 2023-10-23 ENCOUNTER — Ambulatory Visit

## 2023-10-23 VITALS — Ht 74.0 in | Wt 205.0 lb

## 2023-10-23 DIAGNOSIS — Z0000 Encounter for general adult medical examination without abnormal findings: Secondary | ICD-10-CM

## 2023-10-23 NOTE — Progress Notes (Signed)
 Subjective:   Levi [name] is a 78 y.o. male who presents for Medicare Annual/Subsequent preventive examination.  Visit Complete: Virtual I connected with  Levi [name] on 10/23/23 by a audio enabled telemedicine application and verified that I am speaking with the correct person using two identifiers.  Patient Location: Home  Provider Location: Office/Clinic  I discussed the limitations of evaluation and management by telemedicine. The patient expressed understanding and agreed to proceed.  Vital Signs: Because this visit was a virtual/telehealth visit, some criteria may be missing or patient reported. Any vitals not documented were not able to be obtained and vitals that have been documented are patient reported.  Patient Medicare AWV questionnaire was completed by the patient on 10/19/2023; I have confirmed that all information answered by patient is correct and no changes since this date.  Cardiac Risk Factors include: advanced age (>64men, >57 women);male gender;dyslipidemia     Objective:    Today's Vitals   10/23/23 0803  Weight: 205 lb (93 kg)  Height: 6\' 2"  (1.88 m)  PainSc: 8    Body mass index is 26.32 kg/m.     10/23/2023    8:14 AM 10/16/2023   11:38 AM 10/15/2023    2:52 PM 08/12/2023    7:00 PM 07/31/2023    8:26 AM 06/17/2023   10:33 AM 05/27/2023    1:31 PM  Advanced Directives  Does Patient Have a Medical Advance Directive? Yes Yes No Yes Yes Yes Yes  Type of Estate agent of [place];Living will Healthcare Power of [place];Living will  Healthcare Power of [place];Living will Healthcare Power of [place];Living will Healthcare Power of [place];Living will;Out of facility DNR (pink MOST or yellow form) Healthcare Power of [place];Living will;Out of facility DNR (pink MOST or yellow form)  Does patient want to make changes to medical advance directive? No - Patient declined   Yes (Inpatient - patient defers changing a medical advance  directive and declines information at this time) No - Patient declined No - Patient declined No - Patient declined  Copy of Healthcare Power of Attorney in Chart? No - copy requested   No - copy requested No - copy requested No - copy requested No - copy requested    Current Medications (verified) Outpatient Encounter Medications as of 10/23/2023  Medication Sig   albuterol  (VENTOLIN  HFA) 108 (90 Base) MCG/ACT inhaler INHALE 2 PUFFS BY MOUTH EVERY 4 HOURS AS NEEDED FOR WHEEZE OR FOR SHORTNESS OF BREATH   allopurinol  (ZYLOPRIM ) 100 MG tablet TAKE 1 TABLET (100 MG TOTAL) BY MOUTH IN THE MORNING   atorvastatin  (LIPITOR) 20 MG tablet TAKE 1 TABLET BY MOUTH EVERY DAY   ciprofloxacin  (CIPRO ) 500 MG tablet Take 1 tablet (500 mg total) by mouth 2 (two) times daily for 10 days.   cyclobenzaprine  (FLEXERIL ) 10 MG tablet Take 1 tablet (10 mg total) by mouth 3 (three) times daily as needed for muscle spasms.   diltiazem  (CARDIZEM  CD) 180 MG 24 hr capsule Take 1 capsule (180 mg total) by mouth daily. (Patient taking differently: Take 180 mg by mouth daily. Pt takes in am)   diltiazem  (CARDIZEM ) 30 MG tablet TAKE 1 TABLET (30 MG TOTAL) BY MOUTH AS NEEDED (HEART RATE OVER 120).   ELIQUIS  5 MG TABS tablet TAKE 1 TABLET BY MOUTH TWICE A DAY   furosemide  (LASIX ) 40 MG tablet Take 1 tablet (40 mg total) by mouth daily. (Patient taking differently: Take 40 mg by mouth daily as needed for  edema.)   isosorbide  mononitrate (IMDUR ) 30 MG 24 hr tablet TAKE 1 TABLET BY MOUTH EVERY DAY (Patient taking differently: Take 30 mg by mouth daily. Pt takes in am)   meclizine  (ANTIVERT ) 25 MG tablet TAKE 1 TABLET BY MOUTH 3 TIMES DAILY AS NEEDED FOR DIZZINESS.   Olodaterol HCl (STRIVERDI RESPIMAT) 2.5 MCG/ACT AERS INHALE 2 PUFFS BY MOUTH INTO THE LUNGS DAILY   oxyCODONE -acetaminophen  (PERCOCET) 5-325 MG tablet Take 1 tablet by mouth every 4 (four) hours as needed for severe pain (pain score 7-10).   pantoprazole  (PROTONIX ) 40 MG  tablet TAKE 1 TABLET (40 MG TOTAL) BY MOUTH TWICE A DAY BEFORE MEALS   predniSONE  (DELTASONE ) 10 MG tablet Take 20 mg by mouth daily with breakfast.   pregabalin  (LYRICA ) 100 MG capsule Take 1 capsule (100 mg total) by mouth 2 (two) times daily.   tamsulosin  (FLOMAX ) 0.4 MG CAPS capsule Take 0.4 mg by mouth at bedtime.   DULoxetine  (CYMBALTA ) 30 MG capsule Take 1 capsule (30 mg total) by mouth daily. (Patient not taking: Reported on 10/23/2023)   ferrous gluconate  (FERGON) 324 MG tablet Take 1 tablet (324 mg total) by mouth daily. (Patient not taking: Reported on 10/23/2023)   ipratropium-albuterol  (DUONEB) 0.5-2.5 (3) MG/3ML SOLN 1 NEBULE EVERY 6 HOURS AS NEEDED. **J45.3** (Patient not taking: Reported on 10/23/2023)   ondansetron  (ZOFRAN -ODT) 8 MG disintegrating tablet Take 1 tablet (8 mg total) by mouth every 8 (eight) hours as needed for nausea. (Patient not taking: Reported on 10/23/2023)   [DISCONTINUED] metroNIDAZOLE  (FLAGYL ) 500 MG tablet Take 1 tablet (500 mg total) by mouth 2 (two) times daily for 10 days.   No facility-administered encounter medications on file as of 10/23/2023.    Allergies (verified) Levaquin  [levofloxacin ]   History: Past Medical History:  Diagnosis Date   Agent orange exposure 1970's   takes Imdur ,Diltiazem , and Atacand  daily   Agent orange exposure    ALLERGIC RHINITIS    Allergy     seasonal allergies   Anemia    iron  deficiency   Arthritis    Asthma    uses inhaler and nebulizers   Atrial fibrillation (HCC)    Bruises easily    d/t prednisone  daily   Cataract    Cellulitis 03/22/2020   Chest wall pain 09/21/2019   L scapula, reported 4/27 21/     CHF (congestive heart failure) (HCC)    dx-on meds to treat   Chronic bronchitis (HCC)    "used to get it q yr; last time ?2008" (04/28/2012)   Clotting disorder (HCC) 2019   On blood thinner when I bleed it is for a consideral amount of time. This is tegardless of size of wound.   COPD (chronic  obstructive pulmonary disease) (HCC)    agent orange exposure   Degenerative disk disease    "everywhere" (04/28/2012)   Diverticulitis    Dysrhythmia    afib   Elevated uric acid in blood    takes Allopurinol  daily   Emphysema of lung (HCC)    Enlarged prostate    but not on any meds   GERD (gastroesophageal reflux disease)    takes Protonix  daily   H/O hiatal hernia    Headache    History of colonoscopy    History of pneumonia    a. 2010   Hyperlipidemia    takes Lipitor daily; pt. states he takes a preventive   Hypothyroidism    Joint pain    Joint swelling  Leukocytosis    MSSA bacteremia 03/19/2023   Nausea and vomiting 11/12/2022   Neuromuscular disorder (HCC)    bilat legs, and bilat arms   Neuropathy    Osteoporosis 2012   PAF (paroxysmal atrial fibrillation) (HCC)    a. Dx 04/2012, CHA2DS2VASc = 1 (age);  b. 04/2012 Echo: EF 40-50%, mild MR.   Personal history of colonic adenomas 02/09/2013   Pneumonia    Sepsis (HCC) 03/18/2023   Shortness of breath dyspnea    Thyroid  disease    no taking meds-caused dizziness   Past Surgical History:  Procedure Laterality Date   66 HOUR PH STUDY N/A 05/30/2021   Procedure: 24 HOUR PH STUDY;  Surgeon: Annis Kinder, DO;  Location: [facility];  Service: Gastroenterology;  Laterality: N/A;   AMPUTATION TOE Bilateral 08/13/2023   Procedure: LEFT SECOND TOE AMPUTATION AND RIGHT FIFTH TOE AMPUTATION;  Surgeon: Evertt Hoe, DPM;  Location: [facility];  Service: Orthopedics/Podiatry;  Laterality: Bilateral;   ANTERIOR CERVICAL DECOMP/DISCECTOMY FUSION N/A 07/21/2012   Procedure: ANTERIOR CERVICAL DECOMPRESSION/DISCECTOMY FUSION 2 LEVELS;  Surgeon: Elna Haggis, MD;  Location: [facility];  Service: Neurosurgery;  Laterality: N/A;  C4-5 C5-6 Anterior cervical decompression/diskectomy/fusion   ANTERIOR LAT LUMBAR FUSION N/A 04/07/2017   Procedure: Thoracic twelve-Lumbar one Anterolateral decompression/fusion;  Surgeon:  Elna Haggis, MD;  Location: [facility];  Service: Neurosurgery;  Laterality: N/A;   ARTHRODESIS FOOT WITH WEIL OSTEOTOMY Left 11/07/2022   Procedure: LEFT THIRD METATARSOPHALANGEAL CAPSULOTOMY, WEIL OSTEOTOMY;  Surgeon: Donnamarie Gables, MD;  Location: [facility] OR;  Service: Orthopedics;  Laterality: Left;  LENGTH OF SURGERY: 60 MINUTES   BACK SURGERY     x 3   BONE EXOSTOSIS EXCISION Right 10/16/2023   Procedure: EXCISION, EXOSTOSIS;  Surgeon: Dot Gazella, DPM;  Location: [facility];  Service: Orthopedics/Podiatry;  Laterality: Right;   CARDIAC CATHETERIZATION  11/2008   Dr. Renna Cary - 20% calcified non flow limiting left main, 50% EF apical hypokinesis   CARPAL TUNNEL RELEASE Bilateral    CERVICAL DISC ARTHROPLASTY N/A 04/07/2017   Procedure: Cervical six-seven Disc arthroplasty;  Surgeon: Elna Haggis, MD;  Location: [facility] OR;  Service: Neurosurgery;  Laterality: N/A;   CHEST TUBE INSERTION Left 04/11/2017   Procedure: CHEST TUBE INSERTION;  Surgeon: Heriberto London, MD;  Location: [facility] OR;  Service: Thoracic;  Laterality: Left;   CHOLECYSTECTOMY     COLONOSCOPY  2016   CG-MAC-miralax (good)servere TICS/TA x 2   ESOPHAGEAL MANOMETRY N/A 05/30/2021   Procedure: ESOPHAGEAL MANOMETRY (EM);  Surgeon: Annis Kinder, DO;  [facility];  Service: Gastroenterology;  Laterality: N/A;  ph impedence   ESOPHAGOGASTRODUODENOSCOPY     EYE SURGERY Bilateral 2011   steroidal encapsulation" (04/28/2012)   FOOT SURGERY Right    x 2   JOINT REPLACEMENT     Many surgeries including 5 joit replacements   KNEE ARTHROSCOPY Bilateral    LATERAL / POSTERIOR COMBINED FUSION LUMBAR SPINE  2012   LEFT ATRIAL APPENDAGE OCCLUSION N/A 02/22/2021   Procedure: LEFT ATRIAL APPENDAGE OCCLUSION;  Surgeon: Boyce Byes, MD;  [facility];  Service: Cardiovascular;  Laterality: N/A;   NEUROPLASTY / TRANSPOSITION ULNAR NERVE AT ELBOW Bilateral    POLYPECTOMY  2016   severe TICS/TA x 2   POSTERIOR  CERVICAL LAMINECTOMY WITH MET- RX Right 06/11/2021   Procedure: Right Cervical six-seven  Laminectomy and foraminotomy with metrex;  Surgeon: Elna Haggis, MD;  [facility];  Service: Neurosurgery;  Laterality:  Right;   REFRACTIVE SURGERY Left 07/07/2023   laser surgery but still has "floaters"   REVERSE SHOULDER ARTHROPLASTY  04/28/2012   Procedure: REVERSE SHOULDER ARTHROPLASTY;  Surgeon: Derald Flattery, MD;  Location: [facility] OR;  Service: Orthopedics;  Laterality: Left;  Left shouder reverse total shoulder arthroplasty   SHOULDER SURGERY     SPINE SURGERY     I have had 7 spinal surgeries   TEE WITHOUT CARDIOVERSION N/A 02/22/2021   Procedure: TRANSESOPHAGEAL ECHOCARDIOGRAM (TEE);  Surgeon: Boyce Byes, MD;  Location: [facility] INVASIVE CV LAB;  Service: Cardiovascular;  Laterality: N/A;   TENDON REPAIR Right 08/07/2020   Procedure: RIGHT HAND LIGAMENT RECONSTRUCTION AND TENDON INTERPOSITION;  Surgeon: Neil Balls, MD;  Location: [facility];  Service: Orthopedics;  Laterality: Right;   TONSILLECTOMY AND ADENOIDECTOMY  1954   TOTAL HIP ARTHROPLASTY Left 2011   "left" (04/28/2012)   TOTAL HIP ARTHROPLASTY Right 07/31/2015   Procedure: TOTAL HIP ARTHROPLASTY ANTERIOR APPROACH;  Surgeon: Neil Balls, MD;  [facility];  Service: Orthopedics;  Laterality: Right;   TOTAL KNEE ARTHROPLASTY  01/10/2012   Procedure: TOTAL KNEE ARTHROPLASTY;  Surgeon: Boston Byers, MD;  [facility];  Service: Orthopedics;;  left total knee arthroplasty   TOTAL KNEE ARTHROPLASTY Right 06/17/2014   Procedure: TOTAL KNEE ARTHROPLASTY;  Surgeon: Boston Byers, MD;  [facility];  Service: Orthopedics;  Laterality: Right;   toupet fundolplication  11/01/2021   TRANSESOPHAGEAL ECHOCARDIOGRAM (CATH LAB) N/A 03/24/2023   Procedure: TRANSESOPHAGEAL ECHOCARDIOGRAM;  Surgeon: Hazle Lites, MD;  [facility];  Service: Cardiovascular;  Laterality: N/A;   Family History   Problem Relation Age of Onset   Diabetes Mother    Heart disease Father        Died 87, MI   Cancer Father    Hyperlipidemia Father    Stroke Father    Prostate cancer Brother    Colon polyps Brother    Colon polyps Brother    Asthma Brother    COPD Brother    Colon cancer Brother        Dx age 59   Cancer Brother    Colon cancer Other    Esophageal cancer Neg Hx    Rectal cancer Neg Hx    Stomach cancer Neg Hx    Pancreatic cancer Neg Hx    Kidney disease Neg Hx    Liver disease Neg Hx    Neuropathy Neg Hx    Social History   Socioeconomic History   Marital status: Married    Spouse name: Richad Champagne   Number of children: 1   Years of education: 16   Highest education level: Bachelor's degree (e.g., BA, AB, BS)  Occupational History   Occupation: Retired    Comment: Nurse, 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 Daughter lives two miles away. 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 Enjoys being active and working.      Tobacco use, amount per day now: None  Past tobacco use, amount per day: None   How many years did you use tobacco:   Alcohol use (drinks per week): None   Diet:   Do you drink/eat things with caffeine: Coffee   Marital status:  Married                                What year were you married? 1973   Do you live in a house, apartment, assisted living, condo, trailer, etc.? House    Is it one or more stories? 3 stories   How many persons live in your home? 2   Do you have pets in your home?( please list) 3 [employer]    Highest Level  of education completed? Engineer, maintenance (IT).   Current or past profession: Police    Do you exercise?    Yes                              Type and how often? 5 times weekly   Do you have a living will? Yes   Do you have a DNR form?     Yes                              If not, do you want to discuss one?   Do you have signed POA/HPOA forms?    Yes                    If so, please bring to you appointment      Do you have any difficulty bathing or dressing yourself?    Do you have any difficulty preparing food or eating? No   Do you have any difficulty managing your medications? No   Do you have any difficulty managing your finances? No   Do you have any difficulty affording your medications? No   Social Drivers of Corporate investment banker Strain: Low Risk  (10/23/2023)   Overall Financial Resource Strain (CARDIA)    Difficulty of Paying Living Expenses: Not hard at all  Food Insecurity: No Food Insecurity (10/23/2023)   Hunger Vital Sign    Worried About Running Out of Food in the Last Year: Never true    Ran Out of Food in the Last Year: Never true  Transportation Needs: No Transportation Needs (10/23/2023)   PRAPARE - Administrator, Civil Service (Medical): No    Lack of Transportation (Non-Medical): No  Physical Activity: Sufficiently Active (10/23/2023)   Exercise Vital Sign    Days of Exercise per Week: 5 days    Minutes of Exercise per Session: 60 min  Stress: No Stress Concern Present (10/23/2023)   [employer] of Occupational Health - Occupational Stress Questionnaire    Feeling of Stress : Not at all  Social Connections: Socially Integrated (10/23/2023)   Social Connection and Isolation Panel [NHANES]    Frequency of Communication with Friends and Family: More than three times a week    Frequency of Social Gatherings with Friends and Family: More than three times a week    Attends Religious Services: More than 4 times per year    Active Member of [employer] or  Organizations: Yes    Attends Banker Meetings: More than 4 times per year    Marital Status:  Married    Tobacco Counseling Counseling given: Not Answered   Clinical Intake:  Pre-visit preparation completed: Yes  Pain Score: 8  Pain Type: Chronic pain Pain Location: Foot Pain Orientation: Right Pain Descriptors / Indicators: Sharp     BMI - recorded: 26.32 Nutritional Status: BMI 25 -29 Overweight Nutritional Risks: None Diabetes: No  How often do you need to have someone help you when you read instructions, pamphlets, or other written materials from your doctor or pharmacy?: 1 - Never What is the last grade level you completed in school?: 16  Interpreter Needed?: No      Activities of Daily Living    10/23/2023    8:06 AM 10/19/2023    2:23 PM  In your present state of health, do you have any difficulty performing the following activities:  Hearing? 0 0  Vision? 1 1  Difficulty concentrating or making decisions? 0 0  Walking or climbing stairs? 1 1  Dressing or bathing? 0 0  Doing errands, shopping? 0 0  Preparing Food and eating ? N N  Using the Toilet? Y Y  In the past six months, have you accidently leaked urine? Y Y  Do you have problems with loss of bowel control? N N  Managing your Medications? Y Y  Managing your Finances? N N  Housekeeping or managing your Housekeeping? Colie Dawes    Patient Care Team: Gean Keels, MD as PCP - General (Family Medicine) Hugh Madura, MD as PCP - Cardiology (Cardiology) Faustina Hood, MD as Consulting Physician (Pulmonary Disease) Jennefer Moats, DPM as Consulting Physician (Podiatry) Neil Balls, MD as Consulting Physician (Orthopedic Surgery) Elna Haggis, MD as Consulting Physician (Neurosurgery)  Indicate any recent Medical Services you may have received from other than [facility] providers in the past year (date may be approximate).     Assessment:   This is a routine wellness examination  for [place].  Hearing/Vision screen No results found.   Goals Addressed   None   Depression Screen    10/23/2023    8:13 AM 06/17/2023   10:58 AM 06/17/2023   10:57 AM 06/17/2023   10:32 AM 03/12/2023    8:51 AM 11/18/2022   11:24 AM 10/03/2022    8:11 AM  PHQ 2/9 Scores  PHQ - 2 Score 0 1 1 0 0 1 0  PHQ- 9 Score  7         Fall Risk    10/23/2023    8:15 AM 10/19/2023    2:23 PM 06/17/2023   10:54 AM 06/17/2023   10:32 AM 05/27/2023    1:31 PM  Fall Risk   Falls in the past year? 1 1 1  0 1  Number falls in past yr: 1 1 1  0 1  Injury with Fall? 1  1 0 1  Risk for fall due to : Impaired mobility;Impaired balance/gait  History of fall(s) History of fall(s);Impaired balance/gait;Impaired mobility History of fall(s);Impaired balance/gait;Impaired mobility  Follow up Falls evaluation completed   Falls evaluation completed Falls evaluation completed;Education provided;Falls prevention discussed    MEDICARE RISK AT HOME: Medicare Risk at Home Any stairs in or around the home?: Yes If so, are there any without handrails?: No Home free of loose throw rugs in walkways, pet beds, electrical cords, etc?: Yes Adequate lighting in your home to reduce risk of falls?: Yes Life alert?: Yes Use of a cane, walker or w/c?: Yes Grab bars in the bathroom?: Yes Shower chair or bench  in shower?: Yes Elevated toilet seat or a handicapped toilet?: Yes  TIMED UP AND GO:  Was the test performed?  No    Cognitive Function:        10/23/2023    8:16 AM 10/03/2022    8:12 AM  6CIT Screen  What Year? 0 points 0 points  What month? 0 points 0 points  What time? 0 points 0 points  Count back from 20 0 points 0 points  Months in reverse 0 points 0 points  Repeat phrase 0 points 0 points  Total Score 0 points 0 points    Immunizations Immunization History  Administered Date(s) Administered   Fluad Quad(high Dose 65+) 02/17/2019   Fluad Trivalent(High Dose 65+) 04/07/2023   Fluzone Influenza  virus vaccine,trivalent (IIV3), split virus 01/14/2021   H1N1 03/24/2008   Influenza Split 02/09/2009, 01/30/2011, 02/06/2012   Influenza Whole 02/09/2009, 01/25/2010, 02/24/2010   Influenza, High Dose Seasonal PF 02/18/2020   Influenza,inj,Quad PF,6+ Mos 02/08/2013, 02/28/2014, 02/13/2015, 01/22/2016, 01/30/2017, 01/22/2022   Influenza-Unspecified 02/08/2013, 02/28/2014, 02/13/2015, 01/22/2016, 01/30/2017, 01/24/2020   Novel Infuenza-h1n1-09 03/24/2008   PFIZER(Purple Top)SARS-COV-2 Vaccination 07/03/2019, 07/24/2019, 01/24/2020, 02/01/2020, 08/30/2020   Pfizer(Comirnaty)Fall Seasonal Vaccine 12 years and older 08/06/2022   Pneumococcal Conjugate-13 04/27/2015   Pneumococcal Polysaccharide-23 05/27/2002, 02/10/2009, 01/06/2013   Td 01/14/2020, 01/14/2020   Td (Adult) 05/27/2002   Tdap 01/14/2020, 05/01/2021   Unspecified SARS-COV-2 Vaccination 02/25/2022    TDAP status: Up to date  Flu Vaccine status: Up to date  Pneumococcal vaccine status: Up to date  Covid-19 vaccine status: Completed vaccines  Qualifies for Shingles Vaccine? Yes   Zostavax completed No   Shingrix  Completed?: No.    Education has been provided regarding the importance of this vaccine. Patient has been advised to call insurance company to determine out of pocket expense if they have not yet received this vaccine. Advised may also receive vaccine at local pharmacy or Health Dept. Verbalized acceptance and understanding.  Screening Tests Health Maintenance  Topic Date Due   Diabetic kidney evaluation - Urine ACR  Never done   Hepatitis C Screening  Never done   Zoster Vaccines- Shingrix  (1 of 2) Never done   COVID-19 Vaccine (8 - 2024-25 season) 05/27/2024 (Originally 01/26/2023)   INFLUENZA VACCINE  12/26/2023   Diabetic kidney evaluation - eGFR measurement  10/07/2024   Medicare Annual Wellness (AWV)  10/22/2024   Colonoscopy  03/01/2027   DTaP/Tdap/Td (5 - Td or Tdap) 05/02/2031   Pneumonia Vaccine 20+  Years old  Completed   HPV VACCINES  Aged Out   Meningococcal B Vaccine  Aged Out    Health Maintenance  Health Maintenance Due  Topic Date Due   Diabetic kidney evaluation - Urine ACR  Never done   Hepatitis C Screening  Never done   Zoster Vaccines- Shingrix  (1 of 2) Never done    Colorectal cancer screening: Type of screening: Colonoscopy. Completed 02/28/2022. Repeat every 5 years  Lung Cancer Screening: (Low Dose CT Chest recommended if Age 34-80 years, 20 pack-year currently smoking OR have quit w/in 15years.) does not qualify.   Lung Cancer Screening Referral: n/a  Additional Screening:  Hepatitis C Screening: does not qualify; Completed   Vision Screening: Recommended annual ophthalmology exams for early detection of glaucoma and other disorders of the eye. Is the patient up to date with their annual eye exam?  Yes  Who is the provider or what is the name of the office in which the patient attends annual eye  exams? [facility] Surgeon If pt is not established with a provider, would they like to be referred to a provider to establish care? N/a.   Dental Screening: Recommended annual dental exams for proper oral hygiene   Community Resource Referral / Chronic Care Management: CRR required this visit?  No   CCM required this visit?  No     Plan:     I have personally reviewed and noted the following in the patient's chart:   Medical and social history Use of alcohol, tobacco or illicit drugs  Current medications and supplements including opioid prescriptions. Patient is currently taking opioid prescriptions. Information provided to patient regarding non-opioid alternatives. Patient advised to discuss non-opioid treatment plan with their provider. Functional ability and status Nutritional status Physical activity Advanced directives List of other physicians Hospitalizations # 2, surgeries # 2, and ER # 4 visits in previous 12 months Vitals Screenings to  include cognitive, depression, and falls Referrals and appointments  In addition, I have reviewed and discussed with patient certain preventive protocols, quality metrics, and best practice recommendations. A written personalized care plan for preventive services as well as general preventive health recommendations were provided to patient.     Aubrey Leaf, CMA   10/23/2023   After Visit Summary: (MyChart) Due to this being a telephonic visit, the after visit summary with patients personalized plan was offered to patient via MyChart   Nurse Notes:   ELCHANAN BOB is a 78 y.o. male patient of Thekkekandam, Joselyn Nicely, MD who had a Medicare Annual Wellness Visit today via telephone. Brison is Retired and lives with their spouse. He has 1 child. he reports that he is socially active and does interact with friends/family regularly. He is moderately physically active and enjoys being active and working out.

## 2023-10-23 NOTE — Patient Instructions (Signed)
  [name] , Thank you for taking time to come for your Medicare Wellness Visit. I appreciate your ongoing commitment to your health goals. Please review the following plan we discussed and let me know if I can assist you in the future.   These are the goals we discussed:  Goals       Patient Stated (pt-stated)      Patient stated that he would like to be able to walk better and get his feet back to normal.        This is a list of the screening recommended for you and due dates:  Health Maintenance  Topic Date Due   Yearly kidney health urinalysis for diabetes  Never done   Hepatitis C Screening  Never done   Zoster (Shingles) Vaccine (1 of 2) Never done   COVID-19 Vaccine (8 - 2024-25 season) 05/27/2024*   Flu Shot  12/26/2023   Yearly kidney function blood test for diabetes  10/07/2024   Medicare Annual Wellness Visit  10/22/2024   Colon Cancer Screening  03/01/2027   DTaP/Tdap/Td vaccine (5 - Td or Tdap) 05/02/2031   Pneumonia Vaccine  Completed   HPV Vaccine  Aged Out   Meningitis B Vaccine  Aged Out  *Topic was postponed. The date shown is not the original due date.

## 2023-10-24 ENCOUNTER — Ambulatory Visit: Admitting: Podiatry

## 2023-10-24 ENCOUNTER — Encounter: Admitting: Podiatry

## 2023-10-24 DIAGNOSIS — R600 Localized edema: Secondary | ICD-10-CM | POA: Diagnosis not present

## 2023-10-24 DIAGNOSIS — S81801D Unspecified open wound, right lower leg, subsequent encounter: Secondary | ICD-10-CM | POA: Diagnosis not present

## 2023-10-24 DIAGNOSIS — S81801A Unspecified open wound, right lower leg, initial encounter: Secondary | ICD-10-CM | POA: Diagnosis not present

## 2023-10-24 DIAGNOSIS — I872 Venous insufficiency (chronic) (peripheral): Secondary | ICD-10-CM | POA: Diagnosis not present

## 2023-10-24 DIAGNOSIS — S41112A Laceration without foreign body of left upper arm, initial encounter: Secondary | ICD-10-CM | POA: Diagnosis not present

## 2023-10-29 ENCOUNTER — Ambulatory Visit (INDEPENDENT_AMBULATORY_CARE_PROVIDER_SITE_OTHER): Admitting: Podiatry

## 2023-10-29 ENCOUNTER — Encounter: Payer: Self-pay | Admitting: Podiatry

## 2023-10-29 VITALS — Ht 74.0 in | Wt 205.0 lb

## 2023-10-29 DIAGNOSIS — L97512 Non-pressure chronic ulcer of other part of right foot with fat layer exposed: Secondary | ICD-10-CM

## 2023-11-03 ENCOUNTER — Ambulatory Visit (INDEPENDENT_AMBULATORY_CARE_PROVIDER_SITE_OTHER): Admitting: Sports Medicine

## 2023-11-03 VITALS — BP 124/76 | HR 90 | Temp 97.6°F | Wt 210.0 lb

## 2023-11-03 DIAGNOSIS — R8290 Unspecified abnormal findings in urine: Secondary | ICD-10-CM | POA: Diagnosis not present

## 2023-11-03 DIAGNOSIS — M86071 Acute hematogenous osteomyelitis, right ankle and foot: Secondary | ICD-10-CM

## 2023-11-03 NOTE — Addendum Note (Signed)
 Addended by: Burton Casey L on: 11/03/2023 10:30 AM   Modules accepted: Orders

## 2023-11-03 NOTE — Addendum Note (Signed)
 Addended by: Montgomery Apgar on: 11/03/2023 10:36 AM   Modules accepted: Orders

## 2023-11-03 NOTE — Addendum Note (Signed)
 Addended by: Montgomery Apgar on: 11/03/2023 10:24 AM   Modules accepted: Orders

## 2023-11-03 NOTE — Progress Notes (Signed)
    Procedures performed today:    None.  Independent interpretation of notes and tests performed by another provider:   None.  Brief History, Exam, Impression, and Recommendations:    Osteomyelitis [facility]) Pleasant 78 year old male with multiple medical problems, recent exostectomy right foot for osteomyelitis. He has had some visits with podiatry and the last of which was about 5 days ago, they reported the healing was going well. He has however had fatigue, weakness, achiness. He feels like he felt historically when he has become septic. He has had low-grade fevers to 99+ at home. No respiratory symptoms, no GI symptoms, no urinary symptoms. No skin rashes. His exam is normal with the exception of some swelling right lower extremity. We will do somewhat of a shotgun workup, CBC with differential, CMP, urinalysis, I would like a respiratory pathogen panel. Procalcitonin, blood cultures. Continue antibiotics for now.  I spent 30 minutes of total time managing this patient today, this includes chart review, face to face, and non-face to face time.  ____________________________________________ Joselyn Nicely. Sandy Crumb, M.D., ABFM., CAQSM., AME. Primary Care and Sports Medicine [facility]  Adjunct Professor of [employer] Medicine  [employer]  Restaurant manager, fast food

## 2023-11-03 NOTE — Assessment & Plan Note (Signed)
 Pleasant 78 year old male with multiple medical problems, recent exostectomy right foot for osteomyelitis. He has had some visits with podiatry and the last of which was about 5 days ago, they reported the healing was going well. He has however had fatigue, weakness, achiness. He feels like he felt historically when he has become septic. He has had low-grade fevers to 99+ at home. No respiratory symptoms, no GI symptoms, no urinary symptoms. No skin rashes. His exam is normal with the exception of some swelling right lower extremity. We will do somewhat of a shotgun workup, CBC with differential, CMP, urinalysis, I would like a respiratory pathogen panel. Procalcitonin, blood cultures. Continue antibiotics for now.

## 2023-11-04 ENCOUNTER — Ambulatory Visit: Payer: Self-pay | Admitting: Sports Medicine

## 2023-11-04 DIAGNOSIS — M86071 Acute hematogenous osteomyelitis, right ankle and foot: Secondary | ICD-10-CM

## 2023-11-04 LAB — COMPREHENSIVE METABOLIC PANEL WITH GFR
ALT: 9 IU/L (ref 0–44)
AST: 16 IU/L (ref 0–40)
Albumin: 3.6 g/dL — ABNORMAL LOW (ref 3.8–4.8)
Alkaline Phosphatase: 103 IU/L (ref 44–121)
BUN/Creatinine Ratio: 21 (ref 10–24)
BUN: 17 mg/dL (ref 8–27)
Bilirubin Total: 0.4 mg/dL (ref 0.0–1.2)
CO2: 19 mmol/L — ABNORMAL LOW (ref 20–29)
Calcium: 8.8 mg/dL (ref 8.6–10.2)
Chloride: 104 mmol/L (ref 96–106)
Creatinine, Ser: 0.82 mg/dL (ref 0.76–1.27)
Globulin, Total: 1.7 g/dL (ref 1.5–4.5)
Glucose: 103 mg/dL — ABNORMAL HIGH (ref 70–99)
Potassium: 3.6 mmol/L (ref 3.5–5.2)
Sodium: 141 mmol/L (ref 134–144)
Total Protein: 5.3 g/dL — ABNORMAL LOW (ref 6.0–8.5)
eGFR: 91 mL/min/{1.73_m2} (ref >59)

## 2023-11-04 LAB — PROCALCITONIN: Procalcitonin: 0.1 ng/mL — ABNORMAL HIGH (ref 0.00–0.08)

## 2023-11-05 ENCOUNTER — Encounter: Payer: Self-pay | Admitting: Podiatry

## 2023-11-05 ENCOUNTER — Ambulatory Visit (INDEPENDENT_AMBULATORY_CARE_PROVIDER_SITE_OTHER): Admitting: Podiatry

## 2023-11-05 VITALS — Ht 74.0 in | Wt 210.0 lb

## 2023-11-05 DIAGNOSIS — L97512 Non-pressure chronic ulcer of other part of right foot with fat layer exposed: Secondary | ICD-10-CM

## 2023-11-05 LAB — UA/M W/RFLX CULTURE, ROUTINE
Bilirubin, UA: NEGATIVE
Glucose, UA: NEGATIVE
Ketones, UA: NEGATIVE
Nitrite, UA: NEGATIVE
RBC, UA: NEGATIVE
Specific Gravity, UA: 1.023 (ref 1.005–1.030)
Urobilinogen, Ur: 0.2 mg/dL (ref 0.2–1.0)
pH, UA: 6 (ref 5.0–7.5)

## 2023-11-05 LAB — MICROSCOPIC EXAMINATION
Bacteria, UA: NONE SEEN
Casts: NONE SEEN /LPF
RBC, Urine: NONE SEEN /HPF (ref 0–2)

## 2023-11-05 LAB — CBC WITH DIFFERENTIAL/PLATELET
Basophils Absolute: 0.1 x10E3/uL (ref 0.0–0.2)
Basos: 1 %
EOS (ABSOLUTE): 0 x10E3/uL (ref 0.0–0.4)
Eos: 0 %
Hematocrit: 33.3 % — ABNORMAL LOW (ref 37.5–51.0)
Hemoglobin: 10.9 g/dL — ABNORMAL LOW (ref 13.0–17.7)
Immature Grans (Abs): 0.3 x10E3/uL — ABNORMAL HIGH (ref 0.0–0.1)
Immature Granulocytes: 5 %
Lymphocytes Absolute: 0.6 x10E3/uL — ABNORMAL LOW (ref 0.7–3.1)
Lymphs: 8 %
MCH: 31.1 pg (ref 26.6–33.0)
MCHC: 32.7 g/dL (ref 31.5–35.7)
MCV: 95 fL (ref 79–97)
Monocytes Absolute: 0.4 x10E3/uL (ref 0.1–0.9)
Monocytes: 5 %
Neutrophils Absolute: 6.2 10*3/uL (ref 1.4–7.0)
Neutrophils: 81 %
Platelets: 216 10*3/uL (ref 150–450)
RBC: 3.5 x10E6/uL — ABNORMAL LOW (ref 4.14–5.80)
RDW: 14.6 % (ref 11.6–15.4)
WBC: 7.6 10*3/uL (ref 3.4–10.8)

## 2023-11-05 LAB — RESPIRATORY PANEL W/ SARS-COV2

## 2023-11-05 LAB — URINE CULTURE, REFLEX

## 2023-11-05 NOTE — Progress Notes (Signed)
 Chief Complaint  Patient presents with   Wound Check    Pt is here to f/u on right foot due to ulcer, pt states his is doing ok believe wound is healing properly.     Subjective:  Patient presents today status post incision and drainage of the right plantar foot with planning exostectomy.  DOS: 10/16/2023.  Continued improvement.  They have been applying dressings to the foot daily.  WBAT surgical shoe  Past Medical History:  Diagnosis Date   Agent orange exposure 1970's   takes Imdur ,Diltiazem , and Atacand  daily   Agent orange exposure    ALLERGIC RHINITIS    Allergy     seasonal allergies   Anemia    iron  deficiency   Arthritis    Asthma    uses inhaler and nebulizers   Atrial fibrillation (HCC)    Bruises easily    d/t prednisone  daily   Cataract    Cellulitis 03/22/2020   Chest wall pain 09/21/2019   L scapula, reported 4/27 21/     CHF (congestive heart failure) (HCC)    dx-on meds to treat   Chronic bronchitis (HCC)    used to get it q yr; last time ?2008 (04/28/2012)   Clotting disorder (HCC) 2019   On blood thinner when I bleed it is for a consideral amount of time. This is tegardless of size of wound.   COPD (chronic obstructive pulmonary disease) (HCC)    agent orange exposure   Degenerative disk disease    everywhere (04/28/2012)   Diverticulitis    Dysrhythmia    afib   Elevated uric acid in blood    takes Allopurinol  daily   Emphysema of lung (HCC)    Enlarged prostate    but not on any meds   GERD (gastroesophageal reflux disease)    takes Protonix  daily   H/O hiatal hernia    Headache    History of colonoscopy    History of pneumonia    a. 2010   Hyperlipidemia    takes Lipitor daily; pt. states he takes a preventive   Hypothyroidism    Joint pain    Joint swelling    Leukocytosis    MSSA bacteremia 03/19/2023   Nausea and vomiting 11/12/2022   Neuromuscular disorder (HCC)    bilat legs, and bilat arms   Neuropathy    Osteoporosis  2012   PAF (paroxysmal atrial fibrillation) (HCC)    a. Dx 04/2012, CHA2DS2VASc = 1 (age);  b. 04/2012 Echo: EF 40-50%, mild MR.   Personal history of colonic adenomas 02/09/2013   Pneumonia    Sepsis (HCC) 03/18/2023   Shortness of breath dyspnea    Thyroid  disease    no taking meds-caused dizziness    Past Surgical History:  Procedure Laterality Date   80 HOUR PH STUDY N/A 05/30/2021   Procedure: 24 HOUR PH STUDY;  Surgeon: Annis Kinder, DO;  [facility];  Service: Gastroenterology;  Laterality: N/A;   AMPUTATION TOE Bilateral 08/13/2023   Procedure: LEFT SECOND TOE AMPUTATION AND RIGHT FIFTH TOE AMPUTATION;  Surgeon: Evertt Hoe, DPM;  [facility];  Service: Orthopedics/Podiatry;  Laterality: Bilateral;   ANTERIOR CERVICAL DECOMP/DISCECTOMY FUSION N/A 07/21/2012   Procedure: ANTERIOR CERVICAL DECOMPRESSION/DISCECTOMY FUSION 2 LEVELS;  Surgeon: Elna Haggis, MD;  [facility];  Service: Neurosurgery;  Laterality: N/A;  C4-5 C5-6 Anterior cervical decompression/diskectomy/fusion   ANTERIOR LAT LUMBAR FUSION N/A 04/07/2017   Procedure: Thoracic twelve-Lumbar one Anterolateral decompression/fusion;  Surgeon: Elna Haggis, MD;  Location: [facility] OR;  Service: Neurosurgery;  Laterality: N/A;   ARTHRODESIS FOOT WITH WEIL OSTEOTOMY Left 11/07/2022   Procedure: LEFT THIRD METATARSOPHALANGEAL CAPSULOTOMY, WEIL OSTEOTOMY;  Surgeon: Donnamarie Gables, MD;  Location: [facility] OR;  Service: Orthopedics;  Laterality: Left;  LENGTH OF SURGERY: 60 MINUTES   BACK SURGERY     x 3   BONE EXOSTOSIS EXCISION Right 10/16/2023   Procedure: EXCISION, EXOSTOSIS;  Surgeon: Dot Gazella, DPM;  [facility];  Service: Orthopedics/Podiatry;  Laterality: Right;   CARDIAC CATHETERIZATION  11/2008   Dr. Renna Cary - 20% calcified non flow limiting left main, 50% EF apical hypokinesis   CARPAL TUNNEL RELEASE Bilateral    CERVICAL DISC ARTHROPLASTY N/A 04/07/2017   Procedure:  Cervical six-seven Disc arthroplasty;  Surgeon: Elna Haggis, MD;  Location: [facility] OR;  Service: Neurosurgery;  Laterality: N/A;   CHEST TUBE INSERTION Left 04/11/2017   Procedure: CHEST TUBE INSERTION;  Surgeon: Heriberto London, MD;  Location: [facility] OR;  Service: Thoracic;  Laterality: Left;   CHOLECYSTECTOMY     COLONOSCOPY  2016   CG-MAC-miralax (good)servere TICS/TA x 2   ESOPHAGEAL MANOMETRY N/A 05/30/2021   Procedure: ESOPHAGEAL MANOMETRY (EM);  Surgeon: Annis Kinder, DO;  [facility];  Service: Gastroenterology;  Laterality: N/A;  ph impedence   ESOPHAGOGASTRODUODENOSCOPY     EYE SURGERY Bilateral 2011   steroidal encapsulation (04/28/2012)   FOOT SURGERY Right    x 2   JOINT REPLACEMENT     Many surgeries including 5 joit replacements   KNEE ARTHROSCOPY Bilateral    LATERAL / POSTERIOR COMBINED FUSION LUMBAR SPINE  2012   LEFT ATRIAL APPENDAGE OCCLUSION N/A 02/22/2021   Procedure: LEFT ATRIAL APPENDAGE OCCLUSION;  Surgeon: Boyce Byes, MD;  [facility];  Service: Cardiovascular;  Laterality: N/A;   NEUROPLASTY / TRANSPOSITION ULNAR NERVE AT ELBOW Bilateral    POLYPECTOMY  2016   severe TICS/TA x 2   POSTERIOR CERVICAL LAMINECTOMY WITH MET- RX Right 06/11/2021   Procedure: Right Cervical six-seven  Laminectomy and foraminotomy with metrex;  Surgeon: Elna Haggis, MD;  [facility];  Service: Neurosurgery;  Laterality: Right;   REFRACTIVE SURGERY Left 07/07/2023   laser surgery but still has floaters   REVERSE SHOULDER ARTHROPLASTY  04/28/2012   Procedure: REVERSE SHOULDER ARTHROPLASTY;  Surgeon: Derald Flattery, MD;  Location: [facility] OR;  Service: Orthopedics;  Laterality: Left;  Left shouder reverse total shoulder arthroplasty   SHOULDER SURGERY     SPINE SURGERY     I have had 7 spinal surgeries   TEE WITHOUT CARDIOVERSION N/A 02/22/2021   Procedure: TRANSESOPHAGEAL ECHOCARDIOGRAM (TEE);  Surgeon: Boyce Byes, MD;   Location: [facility] INVASIVE CV LAB;  Service: Cardiovascular;  Laterality: N/A;   TENDON REPAIR Right 08/07/2020   Procedure: RIGHT HAND LIGAMENT RECONSTRUCTION AND TENDON INTERPOSITION;  Surgeon: Neil Balls, MD;  Location: [facility];  Service: Orthopedics;  Laterality: Right;   TONSILLECTOMY AND ADENOIDECTOMY  1954   TOTAL HIP ARTHROPLASTY Left 2011   left (04/28/2012)   TOTAL HIP ARTHROPLASTY Right 07/31/2015   Procedure: TOTAL HIP ARTHROPLASTY ANTERIOR APPROACH;  Surgeon: Neil Balls, MD;  [facility];  Service: Orthopedics;  Laterality: Right;   TOTAL KNEE ARTHROPLASTY  01/10/2012   Procedure: TOTAL KNEE ARTHROPLASTY;  Surgeon: Boston Byers, MD;  [facility];  Service: Orthopedics;;  left total knee arthroplasty   TOTAL KNEE ARTHROPLASTY Right 06/17/2014   Procedure:  TOTAL KNEE ARTHROPLASTY;  Surgeon: Boston Byers, MD;  [facility];  Service: Orthopedics;  Laterality: Right;   toupet fundolplication  11/01/2021   TRANSESOPHAGEAL ECHOCARDIOGRAM (CATH LAB) N/A 03/24/2023   Procedure: TRANSESOPHAGEAL ECHOCARDIOGRAM;  Surgeon: Hazle Lites, MD;  [facility];  Service: Cardiovascular;  Laterality: N/A;    Allergies  Allergen Reactions   Levaquin  [Levofloxacin ] Itching    Tolerated ciprofloxacin  in 2022.    Objective/Physical Exam Significant improvement.  Incision site well coapted with staples intact.  Significant reduction of the wound. Plantar wound measures approximately 1.3 x 1.3 x 0.3 cm.  Granular and fibrotic wound base.  No exposed bone.  Scant serous drainage.  Significant reduction of the maceration around the area.  Radiographic Exam RT foot 10/22/2023:  Unchanged.  Charcot neuroarthropathy  Surgical pathology Order: [id number]  Status: Edited Result - FINAL     Next appt: 10/23/2023 at 08:00 AM in Family Medicine [facility] WELLNESS VISIT)   Test Result Released: Yes (seen)   0 Result Notes    Component Ref Range &  Units (hover) 6 d ago  SURGICAL PATHOLOGY SURGICAL PATHOLOGY CASE: [id number] PATIENT: Encarnacion Harris Surgical Pathology Report     Clinical History: Charcot's joint ankle, right, abscess of right foot, right foot ulcer, with fat layer exposed     FINAL MICROSCOPIC DIAGNOSIS:  A. MIDTARSAL BONE, BIOPSY: Reactive cortical and trabecular bone showing acute osteomyelitis   GROSS DESCRIPTION:  Received fresh is a 1.2 x 1.1 x 0.3 cm portion of tan-brown bone.  The specimen is submitted in toto following decalcification.  (GRP 10/17/2023)     Component Ref Range & Units (hover) 6 d ago  Specimen Description BONE RIGHT FOOT Performed at [facility], [address] [place], [place] [address]  Special Requests MIDTARSAL  Gram Stain NO WBC SEEN NO ORGANISMS SEEN  Culture No growth aerobically or anaerobically. Performed at [facility], [address] [place], [place] [address]  Report Status 10/21/2023 FINAL   Component Ref Range & Units (hover) 6 d ago  Specimen Description ABSCESS RIGHT FOOT Performed at [facility], [address] [place], [place] [address]  Special Requests SWABS  Gram Stain NO WBC SEEN NO ORGANISMS SEEN  Culture RARE STAPHYLOCOCCUS COHNII NO ANAEROBES ISOLATED Performed at [facility], [address], [place], [place] [address]  Report Status 10/21/2023 FINAL  Organism ID, Bacteria STAPHYLOCOCCUS COHNII  Resulting Agency [facility]     Susceptibility   Staphylococcus cohnii    MIC    CIPROFLOXACIN  <=0.5 SENSI... Sensitive    CLINDAMYCIN 0.5 SENSITIVE Sensitive    ERYTHROMYCIN >=8 RESISTANT Resistant    GENTAMICIN <=0.5 SENSI... Sensitive    Inducible Clindamycin NEGATIVE Sensitive    OXACILLIN >=4 RESISTANT Resistant    RIFAMPIN <=0.5 SENSI... Sensitive    TETRACYCLINE >=16 RESIST... Resistant    TRIMETH /SULFA  40 SENSITIVE Sensitive    VANCOMYCIN  1 SENSITIVE Sensitive            Susceptibility Comments  Staphylococcus cohnii  RARE STAPHYLOCOCCUS COHNII      Assessment: 1. s/p incision and drainage right foot abscess with exostectomy and bone biopsy right foot. DOS: 10/16/2023   Plan of Care:  -Patient was evaluated.  Staples removed - As long as there is continued improvement we will not pursue infectious disease consult. -Patient has now completed ciprofloxacin  500 mg as prescribed - Continue daily dressing changes to the foot -Return to  clinic 2 weeks follow-up x-ray   Dot Gazella, DPM [facility]  Dr. Dot Gazella, DPM    [address] [place], [place] [address]                Office [phone]  Fax [phone]

## 2023-11-06 DIAGNOSIS — M86071 Acute hematogenous osteomyelitis, right ankle and foot: Secondary | ICD-10-CM | POA: Diagnosis not present

## 2023-11-06 DIAGNOSIS — S81801D Unspecified open wound, right lower leg, subsequent encounter: Secondary | ICD-10-CM | POA: Diagnosis not present

## 2023-11-06 DIAGNOSIS — S41112A Laceration without foreign body of left upper arm, initial encounter: Secondary | ICD-10-CM | POA: Diagnosis not present

## 2023-11-06 DIAGNOSIS — S81801A Unspecified open wound, right lower leg, initial encounter: Secondary | ICD-10-CM | POA: Diagnosis not present

## 2023-11-06 DIAGNOSIS — I872 Venous insufficiency (chronic) (peripheral): Secondary | ICD-10-CM | POA: Diagnosis not present

## 2023-11-06 DIAGNOSIS — R600 Localized edema: Secondary | ICD-10-CM | POA: Diagnosis not present

## 2023-11-07 ENCOUNTER — Ambulatory Visit (INDEPENDENT_AMBULATORY_CARE_PROVIDER_SITE_OTHER): Admitting: Sports Medicine

## 2023-11-07 DIAGNOSIS — Z0189 Encounter for other specified special examinations: Secondary | ICD-10-CM

## 2023-11-07 NOTE — Progress Notes (Signed)
 Pt came in to have RSV collected.  No VS were collected for this visit.   This was done. Pt tolerated collection well.

## 2023-11-08 ENCOUNTER — Other Ambulatory Visit: Payer: Self-pay | Admitting: Internal Medicine

## 2023-11-08 ENCOUNTER — Emergency Department (HOSPITAL_COMMUNITY)

## 2023-11-08 ENCOUNTER — Inpatient Hospital Stay (HOSPITAL_COMMUNITY)

## 2023-11-08 ENCOUNTER — Inpatient Hospital Stay (HOSPITAL_COMMUNITY)
Admission: EM | Admit: 2023-11-08 | Discharge: 2023-11-16 | DRG: 854 | Disposition: A | Discharge status: Rehabilitation Facility | Attending: Internal Medicine | Admitting: Internal Medicine

## 2023-11-08 DIAGNOSIS — L97419 Non-pressure chronic ulcer of right heel and midfoot with unspecified severity: Secondary | ICD-10-CM | POA: Diagnosis present

## 2023-11-08 DIAGNOSIS — E872 Acidosis, unspecified: Secondary | ICD-10-CM | POA: Diagnosis not present

## 2023-11-08 DIAGNOSIS — M7731 Calcaneal spur, right foot: Secondary | ICD-10-CM | POA: Diagnosis not present

## 2023-11-08 DIAGNOSIS — L97919 Non-pressure chronic ulcer of unspecified part of right lower leg with unspecified severity: Secondary | ICD-10-CM | POA: Diagnosis not present

## 2023-11-08 DIAGNOSIS — D62 Acute posthemorrhagic anemia: Secondary | ICD-10-CM | POA: Diagnosis not present

## 2023-11-08 DIAGNOSIS — I5032 Chronic diastolic (congestive) heart failure: Secondary | ICD-10-CM | POA: Diagnosis not present

## 2023-11-08 DIAGNOSIS — Z83438 Family history of other disorder of lipoprotein metabolism and other lipidemia: Secondary | ICD-10-CM

## 2023-11-08 DIAGNOSIS — M869 Osteomyelitis, unspecified: Secondary | ICD-10-CM | POA: Diagnosis not present

## 2023-11-08 DIAGNOSIS — A419 Sepsis, unspecified organism: Secondary | ICD-10-CM | POA: Diagnosis not present

## 2023-11-08 DIAGNOSIS — K5901 Slow transit constipation: Secondary | ICD-10-CM | POA: Diagnosis not present

## 2023-11-08 DIAGNOSIS — S2243XA Multiple fractures of ribs, bilateral, initial encounter for closed fracture: Secondary | ICD-10-CM | POA: Diagnosis not present

## 2023-11-08 DIAGNOSIS — Z800 Family history of malignant neoplasm of digestive organs: Secondary | ICD-10-CM

## 2023-11-08 DIAGNOSIS — Z7901 Long term (current) use of anticoagulants: Secondary | ICD-10-CM

## 2023-11-08 DIAGNOSIS — M2141 Flat foot [pes planus] (acquired), right foot: Secondary | ICD-10-CM | POA: Diagnosis not present

## 2023-11-08 DIAGNOSIS — I5022 Chronic systolic (congestive) heart failure: Secondary | ICD-10-CM | POA: Diagnosis not present

## 2023-11-08 DIAGNOSIS — M7989 Other specified soft tissue disorders: Secondary | ICD-10-CM | POA: Diagnosis not present

## 2023-11-08 DIAGNOSIS — Z7951 Long term (current) use of inhaled steroids: Secondary | ICD-10-CM | POA: Diagnosis not present

## 2023-11-08 DIAGNOSIS — Z89421 Acquired absence of other right toe(s): Secondary | ICD-10-CM

## 2023-11-08 DIAGNOSIS — Z89422 Acquired absence of other left toe(s): Secondary | ICD-10-CM

## 2023-11-08 DIAGNOSIS — J439 Emphysema, unspecified: Secondary | ICD-10-CM | POA: Diagnosis present

## 2023-11-08 DIAGNOSIS — Z825 Family history of asthma and other chronic lower respiratory diseases: Secondary | ICD-10-CM

## 2023-11-08 DIAGNOSIS — I480 Paroxysmal atrial fibrillation: Secondary | ICD-10-CM | POA: Diagnosis present

## 2023-11-08 DIAGNOSIS — F54 Psychological and behavioral factors associated with disorders or diseases classified elsewhere: Secondary | ICD-10-CM | POA: Diagnosis not present

## 2023-11-08 DIAGNOSIS — Z8042 Family history of malignant neoplasm of prostate: Secondary | ICD-10-CM

## 2023-11-08 DIAGNOSIS — M810 Age-related osteoporosis without current pathological fracture: Secondary | ICD-10-CM | POA: Diagnosis present

## 2023-11-08 DIAGNOSIS — Z823 Family history of stroke: Secondary | ICD-10-CM

## 2023-11-08 DIAGNOSIS — Z83719 Family history of colon polyps, unspecified: Secondary | ICD-10-CM

## 2023-11-08 DIAGNOSIS — R6520 Severe sepsis without septic shock: Secondary | ICD-10-CM | POA: Diagnosis not present

## 2023-11-08 DIAGNOSIS — I5042 Chronic combined systolic (congestive) and diastolic (congestive) heart failure: Secondary | ICD-10-CM | POA: Diagnosis not present

## 2023-11-08 DIAGNOSIS — M14671 Charcot's joint, right ankle and foot: Secondary | ICD-10-CM | POA: Diagnosis present

## 2023-11-08 DIAGNOSIS — R55 Syncope and collapse: Secondary | ICD-10-CM | POA: Diagnosis present

## 2023-11-08 DIAGNOSIS — D649 Anemia, unspecified: Secondary | ICD-10-CM | POA: Diagnosis not present

## 2023-11-08 DIAGNOSIS — R0902 Hypoxemia: Secondary | ICD-10-CM | POA: Diagnosis not present

## 2023-11-08 DIAGNOSIS — M216X1 Other acquired deformities of right foot: Secondary | ICD-10-CM | POA: Diagnosis not present

## 2023-11-08 DIAGNOSIS — G548 Other nerve root and plexus disorders: Secondary | ICD-10-CM | POA: Diagnosis not present

## 2023-11-08 DIAGNOSIS — R52 Pain, unspecified: Secondary | ICD-10-CM | POA: Diagnosis not present

## 2023-11-08 DIAGNOSIS — M62838 Other muscle spasm: Secondary | ICD-10-CM | POA: Diagnosis not present

## 2023-11-08 DIAGNOSIS — E1169 Type 2 diabetes mellitus with other specified complication: Secondary | ICD-10-CM | POA: Diagnosis not present

## 2023-11-08 DIAGNOSIS — M19071 Primary osteoarthritis, right ankle and foot: Secondary | ICD-10-CM | POA: Diagnosis not present

## 2023-11-08 DIAGNOSIS — Z87891 Personal history of nicotine dependence: Secondary | ICD-10-CM | POA: Diagnosis not present

## 2023-11-08 DIAGNOSIS — W19XXXA Unspecified fall, initial encounter: Secondary | ICD-10-CM | POA: Diagnosis present

## 2023-11-08 DIAGNOSIS — R0689 Other abnormalities of breathing: Secondary | ICD-10-CM | POA: Diagnosis not present

## 2023-11-08 DIAGNOSIS — Z96643 Presence of artificial hip joint, bilateral: Secondary | ICD-10-CM | POA: Diagnosis not present

## 2023-11-08 DIAGNOSIS — Z9049 Acquired absence of other specified parts of digestive tract: Secondary | ICD-10-CM

## 2023-11-08 DIAGNOSIS — Z96612 Presence of left artificial shoulder joint: Secondary | ICD-10-CM | POA: Diagnosis present

## 2023-11-08 DIAGNOSIS — S88111A Complete traumatic amputation at level between knee and ankle, right lower leg, initial encounter: Secondary | ICD-10-CM | POA: Diagnosis not present

## 2023-11-08 DIAGNOSIS — Z8619 Personal history of other infectious and parasitic diseases: Secondary | ICD-10-CM

## 2023-11-08 DIAGNOSIS — M86271 Subacute osteomyelitis, right ankle and foot: Secondary | ICD-10-CM | POA: Diagnosis not present

## 2023-11-08 DIAGNOSIS — E538 Deficiency of other specified B group vitamins: Secondary | ICD-10-CM | POA: Diagnosis not present

## 2023-11-08 DIAGNOSIS — L03115 Cellulitis of right lower limb: Secondary | ICD-10-CM | POA: Diagnosis present

## 2023-11-08 DIAGNOSIS — Z96653 Presence of artificial knee joint, bilateral: Secondary | ICD-10-CM | POA: Diagnosis present

## 2023-11-08 DIAGNOSIS — Z860101 Personal history of adenomatous and serrated colon polyps: Secondary | ICD-10-CM

## 2023-11-08 DIAGNOSIS — E785 Hyperlipidemia, unspecified: Secondary | ICD-10-CM | POA: Diagnosis not present

## 2023-11-08 DIAGNOSIS — K219 Gastro-esophageal reflux disease without esophagitis: Secondary | ICD-10-CM | POA: Diagnosis present

## 2023-11-08 DIAGNOSIS — I4891 Unspecified atrial fibrillation: Secondary | ICD-10-CM | POA: Diagnosis not present

## 2023-11-08 DIAGNOSIS — Z833 Family history of diabetes mellitus: Secondary | ICD-10-CM | POA: Diagnosis not present

## 2023-11-08 DIAGNOSIS — Z8249 Family history of ischemic heart disease and other diseases of the circulatory system: Secondary | ICD-10-CM

## 2023-11-08 DIAGNOSIS — M4322 Fusion of spine, cervical region: Secondary | ICD-10-CM | POA: Diagnosis not present

## 2023-11-08 DIAGNOSIS — I739 Peripheral vascular disease, unspecified: Secondary | ICD-10-CM | POA: Diagnosis not present

## 2023-11-08 DIAGNOSIS — L089 Local infection of the skin and subcutaneous tissue, unspecified: Secondary | ICD-10-CM | POA: Diagnosis not present

## 2023-11-08 DIAGNOSIS — I110 Hypertensive heart disease with heart failure: Secondary | ICD-10-CM | POA: Diagnosis not present

## 2023-11-08 DIAGNOSIS — Z89511 Acquired absence of right leg below knee: Secondary | ICD-10-CM | POA: Diagnosis not present

## 2023-11-08 DIAGNOSIS — J4489 Other specified chronic obstructive pulmonary disease: Secondary | ICD-10-CM | POA: Diagnosis not present

## 2023-11-08 DIAGNOSIS — G44209 Tension-type headache, unspecified, not intractable: Secondary | ICD-10-CM | POA: Diagnosis not present

## 2023-11-08 DIAGNOSIS — Z4789 Encounter for other orthopedic aftercare: Secondary | ICD-10-CM | POA: Diagnosis not present

## 2023-11-08 DIAGNOSIS — Z7952 Long term (current) use of systemic steroids: Secondary | ICD-10-CM

## 2023-11-08 DIAGNOSIS — I6523 Occlusion and stenosis of bilateral carotid arteries: Secondary | ICD-10-CM | POA: Diagnosis not present

## 2023-11-08 DIAGNOSIS — S161XXA Strain of muscle, fascia and tendon at neck level, initial encounter: Secondary | ICD-10-CM | POA: Diagnosis present

## 2023-11-08 DIAGNOSIS — M109 Gout, unspecified: Secondary | ICD-10-CM | POA: Diagnosis not present

## 2023-11-08 DIAGNOSIS — E876 Hypokalemia: Secondary | ICD-10-CM | POA: Diagnosis present

## 2023-11-08 DIAGNOSIS — N400 Enlarged prostate without lower urinary tract symptoms: Secondary | ICD-10-CM | POA: Diagnosis present

## 2023-11-08 DIAGNOSIS — K5900 Constipation, unspecified: Secondary | ICD-10-CM | POA: Diagnosis not present

## 2023-11-08 DIAGNOSIS — R000 Tachycardia, unspecified: Secondary | ICD-10-CM | POA: Diagnosis not present

## 2023-11-08 DIAGNOSIS — Z4781 Encounter for orthopedic aftercare following surgical amputation: Secondary | ICD-10-CM | POA: Diagnosis not present

## 2023-11-08 DIAGNOSIS — E039 Hypothyroidism, unspecified: Secondary | ICD-10-CM | POA: Diagnosis present

## 2023-11-08 DIAGNOSIS — I959 Hypotension, unspecified: Secondary | ICD-10-CM | POA: Diagnosis not present

## 2023-11-08 DIAGNOSIS — G629 Polyneuropathy, unspecified: Secondary | ICD-10-CM | POA: Diagnosis not present

## 2023-11-08 DIAGNOSIS — Z79899 Other long term (current) drug therapy: Secondary | ICD-10-CM

## 2023-11-08 DIAGNOSIS — Z881 Allergy status to other antibiotic agents status: Secondary | ICD-10-CM

## 2023-11-08 DIAGNOSIS — S0990XA Unspecified injury of head, initial encounter: Secondary | ICD-10-CM | POA: Diagnosis not present

## 2023-11-08 DIAGNOSIS — E871 Hypo-osmolality and hyponatremia: Secondary | ICD-10-CM | POA: Diagnosis not present

## 2023-11-08 DIAGNOSIS — R600 Localized edema: Secondary | ICD-10-CM | POA: Diagnosis not present

## 2023-11-08 DIAGNOSIS — I10 Essential (primary) hypertension: Secondary | ICD-10-CM | POA: Diagnosis not present

## 2023-11-08 DIAGNOSIS — S199XXA Unspecified injury of neck, initial encounter: Secondary | ICD-10-CM | POA: Diagnosis not present

## 2023-11-08 DIAGNOSIS — M25461 Effusion, right knee: Secondary | ICD-10-CM | POA: Diagnosis present

## 2023-11-08 DIAGNOSIS — R601 Generalized edema: Secondary | ICD-10-CM | POA: Diagnosis not present

## 2023-11-08 DIAGNOSIS — R197 Diarrhea, unspecified: Secondary | ICD-10-CM | POA: Diagnosis not present

## 2023-11-08 DIAGNOSIS — M86171 Other acute osteomyelitis, right ankle and foot: Secondary | ICD-10-CM | POA: Diagnosis present

## 2023-11-08 DIAGNOSIS — Z981 Arthrodesis status: Secondary | ICD-10-CM

## 2023-11-08 DIAGNOSIS — Z23 Encounter for immunization: Secondary | ICD-10-CM | POA: Diagnosis not present

## 2023-11-08 DIAGNOSIS — I70261 Atherosclerosis of native arteries of extremities with gangrene, right leg: Secondary | ICD-10-CM | POA: Diagnosis not present

## 2023-11-08 DIAGNOSIS — M25511 Pain in right shoulder: Secondary | ICD-10-CM | POA: Diagnosis not present

## 2023-11-08 DIAGNOSIS — M19011 Primary osteoarthritis, right shoulder: Secondary | ICD-10-CM | POA: Diagnosis not present

## 2023-11-08 DIAGNOSIS — Z471 Aftercare following joint replacement surgery: Secondary | ICD-10-CM | POA: Diagnosis not present

## 2023-11-08 LAB — URINALYSIS, ROUTINE W REFLEX MICROSCOPIC
Bilirubin Urine: NEGATIVE
Glucose, UA: NEGATIVE mg/dL
Hgb urine dipstick: NEGATIVE
Ketones, ur: NEGATIVE mg/dL
Leukocytes,Ua: NEGATIVE
Nitrite: NEGATIVE
Protein, ur: NEGATIVE mg/dL
Specific Gravity, Urine: 1.012 (ref 1.005–1.030)
pH: 7 (ref 5.0–8.0)

## 2023-11-08 LAB — CBC
HCT: 35.8 % — ABNORMAL LOW (ref 39.0–52.0)
Hemoglobin: 11.6 g/dL — ABNORMAL LOW (ref 13.0–17.0)
MCH: 31.1 pg (ref 26.0–34.0)
MCHC: 32.4 g/dL (ref 30.0–36.0)
MCV: 96 fL (ref 80.0–100.0)
Platelets: 172 10*3/uL (ref 150–400)
RBC: 3.73 MIL/uL — ABNORMAL LOW (ref 4.22–5.81)
RDW: 15 % (ref 11.5–15.5)
WBC: 13.8 10*3/uL — ABNORMAL HIGH (ref 4.0–10.5)
nRBC: 0 % (ref 0.0–0.2)

## 2023-11-08 LAB — COMPREHENSIVE METABOLIC PANEL WITH GFR
ALT: 13 U/L (ref 0–44)
AST: 19 U/L (ref 15–41)
Albumin: 2.9 g/dL — ABNORMAL LOW (ref 3.5–5.0)
Alkaline Phosphatase: 66 U/L (ref 38–126)
Anion gap: 12 (ref 5–15)
BUN: 17 mg/dL (ref 8–23)
CO2: 26 mmol/L (ref 22–32)
Calcium: 8.5 mg/dL — ABNORMAL LOW (ref 8.9–10.3)
Chloride: 100 mmol/L (ref 98–111)
Creatinine, Ser: 1.05 mg/dL (ref 0.61–1.24)
GFR, Estimated: >60 mL/min (ref >60)
Glucose, Bld: 88 mg/dL (ref 70–99)
Potassium: 2.9 mmol/L — ABNORMAL LOW (ref 3.5–5.1)
Sodium: 138 mmol/L (ref 135–145)
Total Bilirubin: 1.1 mg/dL (ref 0.0–1.2)
Total Protein: 5.6 g/dL — ABNORMAL LOW (ref 6.5–8.1)

## 2023-11-08 LAB — I-STAT CHEM 8, ED
BUN: 17 mg/dL (ref 8–23)
Calcium, Ion: 1.06 mmol/L — ABNORMAL LOW (ref 1.15–1.40)
Chloride: 98 mmol/L (ref 98–111)
Creatinine, Ser: 1 mg/dL (ref 0.61–1.24)
Glucose, Bld: 83 mg/dL (ref 70–99)
HCT: 34 % — ABNORMAL LOW (ref 39.0–52.0)
Hemoglobin: 11.6 g/dL — ABNORMAL LOW (ref 13.0–17.0)
Potassium: 2.9 mmol/L — ABNORMAL LOW (ref 3.5–5.1)
Sodium: 137 mmol/L (ref 135–145)
TCO2: 25 mmol/L (ref 22–32)

## 2023-11-08 LAB — SAMPLE TO BLOOD BANK

## 2023-11-08 LAB — I-STAT CG4 LACTIC ACID, ED: Lactic Acid, Venous: 2.4 mmol/L (ref 0.5–1.9)

## 2023-11-08 LAB — C-REACTIVE PROTEIN: CRP: 16.5 mg/dL — ABNORMAL HIGH (ref <1.0)

## 2023-11-08 LAB — ETHANOL: Alcohol, Ethyl (B): <15 mg/dL (ref <15)

## 2023-11-08 LAB — PROTIME-INR
INR: 1.4 — ABNORMAL HIGH (ref 0.8–1.2)
Prothrombin Time: 17.8 s — ABNORMAL HIGH (ref 11.4–15.2)

## 2023-11-08 LAB — SEDIMENTATION RATE: Sed Rate: 30 mm/h — ABNORMAL HIGH (ref 0–16)

## 2023-11-08 LAB — LACTIC ACID, PLASMA: Lactic Acid, Venous: 2.4 mmol/L (ref 0.5–1.9)

## 2023-11-08 LAB — PROCALCITONIN: Procalcitonin: 1.92 ng/mL

## 2023-11-08 MED ORDER — APIXABAN 5 MG PO TABS
5.0000 mg | ORAL_TABLET | Freq: Two times a day (BID) | ORAL | Status: DC
  Administered 2023-11-09 – 2023-11-10 (×4): 5 mg via ORAL
  Filled 2023-11-08 (×6): qty 1

## 2023-11-08 MED ORDER — POTASSIUM CHLORIDE CRYS ER 20 MEQ PO TBCR
40.0000 meq | EXTENDED_RELEASE_TABLET | Freq: Two times a day (BID) | ORAL | Status: AC
  Filled 2023-11-08 (×2): qty 2

## 2023-11-08 MED ORDER — PANTOPRAZOLE SODIUM 40 MG PO TBEC
40.0000 mg | DELAYED_RELEASE_TABLET | Freq: Two times a day (BID) | ORAL | Status: DC
  Administered 2023-11-09 – 2023-11-16 (×15): 40 mg via ORAL
  Filled 2023-11-08 (×17): qty 1

## 2023-11-08 MED ORDER — SODIUM CHLORIDE 0.9 % IV BOLUS: 1000.0000 mL | Freq: Once | INTRAVENOUS | Status: AC

## 2023-11-08 MED ORDER — SODIUM CHLORIDE 0.9 % IV SOLN
2.0000 g | INTRAVENOUS | Status: AC
  Filled 2023-11-08: qty 12.5

## 2023-11-08 MED ORDER — POTASSIUM CHLORIDE CRYS ER 20 MEQ PO TBCR
60.0000 meq | EXTENDED_RELEASE_TABLET | Freq: Once | ORAL | Status: AC
  Filled 2023-11-08: qty 3

## 2023-11-08 MED ORDER — ENOXAPARIN SODIUM 40 MG/0.4ML IJ SOSY: 40.0000 mg | PREFILLED_SYRINGE | INTRAMUSCULAR | Status: DC

## 2023-11-08 MED ORDER — ATORVASTATIN CALCIUM 10 MG PO TABS
20.0000 mg | ORAL_TABLET | Freq: Every day | ORAL | Status: DC
  Administered 2023-11-09 – 2023-11-16 (×8): 20 mg via ORAL
  Filled 2023-11-08 (×10): qty 2

## 2023-11-08 MED ORDER — DILTIAZEM HCL ER COATED BEADS 180 MG PO CP24
180.0000 mg | ORAL_CAPSULE | Freq: Every day | ORAL | Status: DC
  Administered 2023-11-09 – 2023-11-16 (×8): 180 mg via ORAL
  Filled 2023-11-08 (×9): qty 1

## 2023-11-08 MED ORDER — VANCOMYCIN HCL 2000 MG/400ML IV SOLN
2000.0000 mg | Freq: Once | INTRAVENOUS | Status: AC
  Filled 2023-11-08: qty 400

## 2023-11-08 MED ORDER — VANCOMYCIN HCL IN DEXTROSE 1-5 GM/200ML-% IV SOLN: 1000.0000 mg | Freq: Once | INTRAVENOUS | Status: DC

## 2023-11-08 MED ORDER — VANCOMYCIN HCL 2000 MG/400ML IV SOLN
2000.0000 mg | INTRAVENOUS | Status: DC
  Filled 2023-11-08: qty 400

## 2023-11-08 MED ORDER — SODIUM CHLORIDE 0.9 % IV SOLN: INTRAVENOUS | Status: AC

## 2023-11-08 MED ORDER — GADOBUTROL 1 MMOL/ML IV SOLN: 10.0000 mL | Freq: Once | INTRAVENOUS | Status: AC | PRN

## 2023-11-08 MED ORDER — IPRATROPIUM-ALBUTEROL 0.5-2.5 (3) MG/3ML IN SOLN: 3.0000 mL | Freq: Four times a day (QID) | RESPIRATORY_TRACT | Status: DC | PRN

## 2023-11-08 MED ORDER — PREGABALIN 100 MG PO CAPS
100.0000 mg | ORAL_CAPSULE | Freq: Two times a day (BID) | ORAL | Status: DC
  Administered 2023-11-09 – 2023-11-16 (×15): 100 mg via ORAL
  Filled 2023-11-08 (×17): qty 1

## 2023-11-08 MED ORDER — BUDESONIDE 0.25 MG/2ML IN SUSP
0.2500 mg | Freq: Two times a day (BID) | RESPIRATORY_TRACT | Status: DC
  Administered 2023-11-09 – 2023-11-16 (×13): 0.25 mg via RESPIRATORY_TRACT
  Filled 2023-11-08 (×14): qty 2

## 2023-11-08 MED ORDER — METRONIDAZOLE 500 MG/100ML IV SOLN
500.0000 mg | Freq: Once | INTRAVENOUS | Status: AC
  Filled 2023-11-08: qty 100

## 2023-11-08 MED ORDER — TETANUS-DIPHTH-ACELL PERTUSSIS 5-2.5-18.5 LF-MCG/0.5 IM SUSY
0.5000 mL | PREFILLED_SYRINGE | Freq: Once | INTRAMUSCULAR | Status: AC
  Filled 2023-11-08: qty 0.5

## 2023-11-08 MED ORDER — OXYCODONE-ACETAMINOPHEN 5-325 MG PO TABS
1.0000 | ORAL_TABLET | ORAL | Status: DC | PRN
  Administered 2023-11-08 – 2023-11-16 (×10): 1 via ORAL
  Filled 2023-11-08 (×11): qty 1

## 2023-11-08 MED ORDER — SODIUM CHLORIDE 0.9 % IV SOLN
2.0000 g | Freq: Three times a day (TID) | INTRAVENOUS | Status: AC
  Administered 2023-11-08 – 2023-11-15 (×20): 2 g via INTRAVENOUS
  Filled 2023-11-08 (×21): qty 12.5

## 2023-11-08 MED ORDER — ARFORMOTEROL TARTRATE 15 MCG/2ML IN NEBU
15.0000 ug | INHALATION_SOLUTION | Freq: Two times a day (BID) | RESPIRATORY_TRACT | Status: DC
  Administered 2023-11-09 – 2023-11-16 (×13): 15 ug via RESPIRATORY_TRACT
  Filled 2023-11-08 (×14): qty 2

## 2023-11-08 MED ORDER — MAGNESIUM SULFATE 2 GM/50ML IV SOLN
2.0000 g | Freq: Once | INTRAVENOUS | Status: AC
  Filled 2023-11-08: qty 50

## 2023-11-08 MED ORDER — TAMSULOSIN HCL 0.4 MG PO CAPS
0.4000 mg | ORAL_CAPSULE | Freq: Every day | ORAL | Status: DC
  Administered 2023-11-09 – 2023-11-15 (×7): 0.4 mg via ORAL
  Filled 2023-11-08 (×9): qty 1

## 2023-11-08 NOTE — ED Notes (Signed)
 Hooked patient back up to the monitor family is at bedside and call bell in reach

## 2023-11-08 NOTE — ED Notes (Signed)
 Trauma Response Nurse Documentation   Levi [name] is a 78 y.o. male arriving to [place] ED via [facility]  On Eliquis  (apixaban ) daily. Trauma was activated as a Level 2 by Eddye Goodie based on the following trauma criteria Elderly patients > 65 with head trauma on anti-coagulation (excluding ASA).  Patient cleared for CT by Dr. Maralee Senate. Pt transported to CT with trauma response nurse present to monitor. RN remained with the patient throughout their absence from the department for clinical observation.   GCS 15.  Trauma MD Arrival Time: N/A.  History   Past Medical History:  Diagnosis Date   Agent orange exposure 1970's   takes Imdur ,Diltiazem , and Atacand  daily   Agent orange exposure    ALLERGIC RHINITIS    Allergy     seasonal allergies   Anemia    iron  deficiency   Arthritis    Asthma    uses inhaler and nebulizers   Atrial fibrillation (HCC)    Bruises easily    d/t prednisone  daily   Cataract    Cellulitis 03/22/2020   Chest wall pain 09/21/2019   L scapula, reported 4/27 21/     CHF (congestive heart failure) (HCC)    dx-on meds to treat   Chronic bronchitis (HCC)    used to get it q yr; last time ?2008 (04/28/2012)   Clotting disorder (HCC) 2019   On blood thinner when I bleed it is for a consideral amount of time. This is tegardless of size of wound.   COPD (chronic obstructive pulmonary disease) (HCC)    agent orange exposure   Degenerative disk disease    everywhere (04/28/2012)   Diverticulitis    Dysrhythmia    afib   Elevated uric acid in blood    takes Allopurinol  daily   Emphysema of lung (HCC)    Enlarged prostate    but not on any meds   GERD (gastroesophageal reflux disease)    takes Protonix  daily   H/O hiatal hernia    Headache    History of colonoscopy    History of pneumonia    a. 2010   Hyperlipidemia    takes Lipitor daily; pt. states he takes a preventive   Hypothyroidism    Joint pain    Joint swelling     Leukocytosis    MSSA bacteremia 03/19/2023   Nausea and vomiting 11/12/2022   Neuromuscular disorder (HCC)    bilat legs, and bilat arms   Neuropathy    Osteoporosis 2012   PAF (paroxysmal atrial fibrillation) (HCC)    a. Dx 04/2012, CHA2DS2VASc = 1 (age);  b. 04/2012 Echo: EF 40-50%, mild MR.   Personal history of colonic adenomas 02/09/2013   Pneumonia    Sepsis (HCC) 03/18/2023   Shortness of breath dyspnea    Thyroid  disease    no taking meds-caused dizziness     Past Surgical History:  Procedure Laterality Date   20 HOUR PH STUDY N/A 05/30/2021   Procedure: 24 HOUR PH STUDY;  Surgeon: Annis Kinder, DO;  [facility];  Service: Gastroenterology;  Laterality: N/A;   AMPUTATION TOE Bilateral 08/13/2023   Procedure: LEFT SECOND TOE AMPUTATION AND RIGHT FIFTH TOE AMPUTATION;  Surgeon: Evertt Hoe, DPM;  [facility];  Service: Orthopedics/Podiatry;  Laterality: Bilateral;   ANTERIOR CERVICAL DECOMP/DISCECTOMY FUSION N/A 07/21/2012   Procedure: ANTERIOR CERVICAL DECOMPRESSION/DISCECTOMY FUSION 2 LEVELS;  Surgeon: Elna Haggis, MD;  [facility];  Service: Neurosurgery;  Laterality: N/A;  C4-5 C5-6 Anterior cervical decompression/diskectomy/fusion   ANTERIOR LAT LUMBAR FUSION N/A 04/07/2017   Procedure: Thoracic twelve-Lumbar one Anterolateral decompression/fusion;  Surgeon: Elna Haggis, MD;  [facility];  Service: Neurosurgery;  Laterality: N/A;   ARTHRODESIS FOOT WITH WEIL OSTEOTOMY Left 11/07/2022   Procedure: LEFT THIRD METATARSOPHALANGEAL CAPSULOTOMY, WEIL OSTEOTOMY;  Surgeon: Donnamarie Gables, MD;  Location: [facility] OR;  Service: Orthopedics;  Laterality: Left;  LENGTH OF SURGERY: 60 MINUTES   BACK SURGERY     x 3   BONE EXOSTOSIS EXCISION Right 10/16/2023   Procedure: EXCISION, EXOSTOSIS;  Surgeon: Dot Gazella, DPM;  [facility];  Service: Orthopedics/Podiatry;  Laterality: Right;   CARDIAC CATHETERIZATION  11/2008   Dr.  Renna Cary - 20% calcified non flow limiting left main, 50% EF apical hypokinesis   CARPAL TUNNEL RELEASE Bilateral    CERVICAL DISC ARTHROPLASTY N/A 04/07/2017   Procedure: Cervical six-seven Disc arthroplasty;  Surgeon: Elna Haggis, MD;  Location: [facility] OR;  Service: Neurosurgery;  Laterality: N/A;   CHEST TUBE INSERTION Left 04/11/2017   Procedure: CHEST TUBE INSERTION;  Surgeon: Heriberto London, MD;  Location: [facility] OR;  Service: Thoracic;  Laterality: Left;   CHOLECYSTECTOMY     COLONOSCOPY  2016   CG-MAC-miralax (good)servere TICS/TA x 2   ESOPHAGEAL MANOMETRY N/A 05/30/2021   Procedure: ESOPHAGEAL MANOMETRY (EM);  Surgeon: Annis Kinder, DO;  [facility];  Service: Gastroenterology;  Laterality: N/A;  ph impedence   ESOPHAGOGASTRODUODENOSCOPY     EYE SURGERY Bilateral 2011   steroidal encapsulation (04/28/2012)   FOOT SURGERY Right    x 2   JOINT REPLACEMENT     Many surgeries including 5 joit replacements   KNEE ARTHROSCOPY Bilateral    LATERAL / POSTERIOR COMBINED FUSION LUMBAR SPINE  2012   LEFT ATRIAL APPENDAGE OCCLUSION N/A 02/22/2021   Procedure: LEFT ATRIAL APPENDAGE OCCLUSION;  Surgeon: Boyce Byes, MD;  [facility];  Service: Cardiovascular;  Laterality: N/A;   NEUROPLASTY / TRANSPOSITION ULNAR NERVE AT ELBOW Bilateral    POLYPECTOMY  2016   severe TICS/TA x 2   POSTERIOR CERVICAL LAMINECTOMY WITH MET- RX Right 06/11/2021   Procedure: Right Cervical six-seven  Laminectomy and foraminotomy with metrex;  Surgeon: Elna Haggis, MD;  [facility];  Service: Neurosurgery;  Laterality: Right;   REFRACTIVE SURGERY Left 07/07/2023   laser surgery but still has floaters   REVERSE SHOULDER ARTHROPLASTY  04/28/2012   Procedure: REVERSE SHOULDER ARTHROPLASTY;  Surgeon: Derald Flattery, MD;  Location: [facility] OR;  Service: Orthopedics;  Laterality: Left;  Left shouder reverse total shoulder arthroplasty   SHOULDER SURGERY     SPINE SURGERY      I have had 7 spinal surgeries   TEE WITHOUT CARDIOVERSION N/A 02/22/2021   Procedure: TRANSESOPHAGEAL ECHOCARDIOGRAM (TEE);  Surgeon: Boyce Byes, MD;  Location: [facility] INVASIVE CV LAB;  Service: Cardiovascular;  Laterality: N/A;   TENDON REPAIR Right 08/07/2020   Procedure: RIGHT HAND LIGAMENT RECONSTRUCTION AND TENDON INTERPOSITION;  Surgeon: Neil Balls, MD;  Location:  SURGERY CENTER;  Service: Orthopedics;  Laterality: Right;   TONSILLECTOMY AND ADENOIDECTOMY  1954   TOTAL HIP ARTHROPLASTY Left 2011   left (04/28/2012)   TOTAL HIP ARTHROPLASTY Right 07/31/2015   Procedure: TOTAL HIP ARTHROPLASTY ANTERIOR APPROACH;  Surgeon: Neil Balls, MD;  [facility];  Service: Orthopedics;  Laterality: Right;   TOTAL KNEE ARTHROPLASTY  01/10/2012   Procedure: TOTAL KNEE ARTHROPLASTY;  Surgeon: Autry Legions  Bud Care, MD;  [facility];  Service: Orthopedics;;  left total knee arthroplasty   TOTAL KNEE ARTHROPLASTY Right 06/17/2014   Procedure: TOTAL KNEE ARTHROPLASTY;  Surgeon: Boston Byers, MD;  [facility];  Service: Orthopedics;  Laterality: Right;   toupet fundolplication  11/01/2021   TRANSESOPHAGEAL ECHOCARDIOGRAM (CATH LAB) N/A 03/24/2023   Procedure: TRANSESOPHAGEAL ECHOCARDIOGRAM;  Surgeon: Hazle Lites, MD;  [facility];  Service: Cardiovascular;  Laterality: N/A;       Initial Focused Assessment (If applicable, or please see trauma documentation): Airway-- intact, no visible obstruction Breathing-- spontaneous, unlabored Circulation-- minor skin tear to right arm, no other obvious bleeding noted  CT's Completed:   CT Head and CT C-Spine   Interventions:  See event summary  Plan for disposition:  Admission to floor   Consults completed:  none at 0624.  Event Summary: Patient brought in by [facility], patient had ground level fall striking his head. Patient with complaint of neck pain. On arrival, patient alert and oriented  x4, GCS 15. Patient transferred from EMS stretcher to hospital stretcher. On exam patient with right leg cellulitis. Patient placed in c-collar. Xray chest completed. Lab work obtained. Patient to CT with TRN, Primary RN. CT head, c-spine completed. Patient back to trauma bay at this time.   MTP Summary (If applicable):  N/A  Bedside handoff with ED RN Tiffany.    Brunetta Capes  Trauma Response RN  Please call TRN at [phone] for further assistance.

## 2023-11-08 NOTE — Progress Notes (Signed)
 Orthopedic Tech Progress Note Patient Details:  Levi [name] 24-Sep-1945 [id number]  Patient ID: [name], male   DOB: 1945-11-25, 78 y.o.   MRN: [id number] Checked in for Level 2 trauma.  Rayna Calkin 11/08/2023, 5:02 AM

## 2023-11-08 NOTE — H&P (Signed)
 History and Physical    Patient: Levi [name]:[id number] DOB: 06/20/1945 DOA: 11/08/2023 DOS: the patient was seen and examined on 11/08/2023 PCP: Gean Keels, MD  Patient coming from: Home  Chief Complaint:  Chief Complaint  Patient presents with   fall on thinners   HPI: Levi [name] is a 78 y.o. male with medical history significant of PAF on Eliquis , HFpEF (EF 40-45% in 02/2023), HTN, HLD, COPD, BPH, GERD, and s/p recent LLE 2nd digit/RLE  5th digit ray amputation on 08/13/2023 p/w presyncopal episode iso sepsis 2/2 RLE cellulitis vs OM.  Pt states that he was in his USOH until 0400 this morning when he fell to the right while washing his hands after urinating. The pt reports waking up as usual, walking to restroom w/o issue, and using the restroom w/o issue before becoming weak and slumping to right. He banged his body against the wall and was in a slouching position when his wife found him and activated EMS. He denies LOC, but did strike his head against the bathroom wall. No obvious laceration or hemorrhage. Pt denies any h/o recurrent falls or syncopal episodes.  In the ED, pt tachycardic, and  tachypneic on RA. Labs notable for K 2.9, Cr 1.05, albumin 2.9, WBC 13.8, and lactate 2.4. CTH NAICA. CT C-spine no acute abnl. CXR no acute cardiopulmonary findings. CR R foot CT showed moderate edema, advanced Charcot foot, and old fifth toe amputation and remote resection of the second through fourth proximal phalangeal heads. Pt received IV vancomycin , IV cefepime , and IV metronidazole  in the ED and was admitted to medicine for sepsis of unknown origin.  NOTE:  10/08/2023 CT RLE W/ contrast: Soft tissue wound involving the plantar aspect of the midfoot at the level of the Lisfranc joint with underlying ill-defined fluid collection suspicious for abscess. There is ill-defined fluid and air bubbles tracking distally along the plantar fascia and extensor tendons to the level of  the mid metatarsals, suspicious for necrotizing infection. 2. No evidence of acute osteomyelitis.  Review of Systems: As mentioned in the history of present illness. All other systems reviewed and are negative. Past Medical History:  Diagnosis Date   Agent orange exposure 1970's   takes Imdur ,Diltiazem , and Atacand  daily   Agent orange exposure    ALLERGIC RHINITIS    Allergy     seasonal allergies   Anemia    iron  deficiency   Arthritis    Asthma    uses inhaler and nebulizers   Atrial fibrillation (HCC)    Bruises easily    d/t prednisone  daily   Cataract    Cellulitis 03/22/2020   Chest wall pain 09/21/2019   L scapula, reported 4/27 21/     CHF (congestive heart failure) (HCC)    dx-on meds to treat   Chronic bronchitis (HCC)    used to get it q yr; last time ?2008 (04/28/2012)   Clotting disorder (HCC) 2019   On blood thinner when I bleed it is for a consideral amount of time. This is tegardless of size of wound.   COPD (chronic obstructive pulmonary disease) (HCC)    agent orange exposure   Degenerative disk disease    everywhere (04/28/2012)   Diverticulitis    Dysrhythmia    afib   Elevated uric acid in blood    takes Allopurinol  daily   Emphysema of lung (HCC)    Enlarged prostate    but not on any meds   GERD (gastroesophageal  reflux disease)    takes Protonix  daily   H/O hiatal hernia    Headache    History of colonoscopy    History of pneumonia    a. 2010   Hyperlipidemia    takes Lipitor daily; pt. states he takes a preventive   Hypothyroidism    Joint pain    Joint swelling    Leukocytosis    MSSA bacteremia 03/19/2023   Nausea and vomiting 11/12/2022   Neuromuscular disorder (HCC)    bilat legs, and bilat arms   Neuropathy    Osteoporosis 2012   PAF (paroxysmal atrial fibrillation) (HCC)    a. Dx 04/2012, CHA2DS2VASc = 1 (age);  b. 04/2012 Echo: EF 40-50%, mild MR.   Personal history of colonic adenomas 02/09/2013   Pneumonia    Sepsis  (HCC) 03/18/2023   Shortness of breath dyspnea    Thyroid  disease    no taking meds-caused dizziness   Past Surgical History:  Procedure Laterality Date   74 HOUR PH STUDY N/A 05/30/2021   Procedure: 24 HOUR PH STUDY;  Surgeon: Annis Kinder, DO;  Location: [facility];  Service: Gastroenterology;  Laterality: N/A;   AMPUTATION TOE Bilateral 08/13/2023   Procedure: LEFT SECOND TOE AMPUTATION AND RIGHT FIFTH TOE AMPUTATION;  Surgeon: Evertt Hoe, DPM;  Location: [facility];  Service: Orthopedics/Podiatry;  Laterality: Bilateral;   ANTERIOR CERVICAL DECOMP/DISCECTOMY FUSION N/A 07/21/2012   Procedure: ANTERIOR CERVICAL DECOMPRESSION/DISCECTOMY FUSION 2 LEVELS;  Surgeon: Elna Haggis, MD;  Location: [facility];  Service: Neurosurgery;  Laterality: N/A;  C4-5 C5-6 Anterior cervical decompression/diskectomy/fusion   ANTERIOR LAT LUMBAR FUSION N/A 04/07/2017   Procedure: Thoracic twelve-Lumbar one Anterolateral decompression/fusion;  Surgeon: Elna Haggis, MD;  Location: [facility] OR;  Service: Neurosurgery;  Laterality: N/A;   ARTHRODESIS FOOT WITH WEIL OSTEOTOMY Left 11/07/2022   Procedure: LEFT THIRD METATARSOPHALANGEAL CAPSULOTOMY, WEIL OSTEOTOMY;  Surgeon: Donnamarie Gables, MD;  Location: [facility] OR;  Service: Orthopedics;  Laterality: Left;  LENGTH OF SURGERY: 60 MINUTES   BACK SURGERY     x 3   BONE EXOSTOSIS EXCISION Right 10/16/2023   Procedure: EXCISION, EXOSTOSIS;  Surgeon: Dot Gazella, DPM;  Location: [facility];  Service: Orthopedics/Podiatry;  Laterality: Right;   CARDIAC CATHETERIZATION  11/2008   Dr. Renna Cary - 20% calcified non flow limiting left main, 50% EF apical hypokinesis   CARPAL TUNNEL RELEASE Bilateral    CERVICAL DISC ARTHROPLASTY N/A 04/07/2017   Procedure: Cervical six-seven Disc arthroplasty;  Surgeon: Elna Haggis, MD;  Location: [facility] OR;  Service: Neurosurgery;  Laterality: N/A;   CHEST TUBE INSERTION Left 04/11/2017   Procedure: CHEST TUBE INSERTION;  Surgeon:  Heriberto London, MD;  Location: [facility] OR;  Service: Thoracic;  Laterality: Left;   CHOLECYSTECTOMY     COLONOSCOPY  2016   CG-MAC-miralax (good)servere TICS/TA x 2   ESOPHAGEAL MANOMETRY N/A 05/30/2021   Procedure: ESOPHAGEAL MANOMETRY (EM);  Surgeon: Annis Kinder, DO;  Location: [facility];  Service: Gastroenterology;  Laterality: N/A;  ph impedence   ESOPHAGOGASTRODUODENOSCOPY     EYE SURGERY Bilateral 2011   steroidal encapsulation (04/28/2012)   FOOT SURGERY Right    x 2   JOINT REPLACEMENT     Many surgeries including 5 joit replacements   KNEE ARTHROSCOPY Bilateral    LATERAL / POSTERIOR COMBINED FUSION LUMBAR SPINE  2012   LEFT ATRIAL APPENDAGE OCCLUSION N/A 02/22/2021   Procedure: LEFT ATRIAL APPENDAGE OCCLUSION;  Surgeon: Boyce Byes, MD;  Location: [facility] INVASIVE CV  LAB;  Service: Cardiovascular;  Laterality: N/A;   NEUROPLASTY / TRANSPOSITION ULNAR NERVE AT ELBOW Bilateral    POLYPECTOMY  2016   severe TICS/TA x 2   POSTERIOR CERVICAL LAMINECTOMY WITH MET- RX Right 06/11/2021   Procedure: Right Cervical six-seven  Laminectomy and foraminotomy with metrex;  Surgeon: Elna Haggis, MD;  Location: [facility];  Service: Neurosurgery;  Laterality: Right;   REFRACTIVE SURGERY Left 07/07/2023   laser surgery but still has floaters   REVERSE SHOULDER ARTHROPLASTY  04/28/2012   Procedure: REVERSE SHOULDER ARTHROPLASTY;  Surgeon: Derald Flattery, MD;  [facility] OR;  Service: Orthopedics;  Laterality: Left;  Left shouder reverse total shoulder arthroplasty   SHOULDER SURGERY     SPINE SURGERY     I have had 7 spinal surgeries   TEE WITHOUT CARDIOVERSION N/A 02/22/2021   Procedure: TRANSESOPHAGEAL ECHOCARDIOGRAM (TEE);  Surgeon: Boyce Byes, MD;  Location: [facility] INVASIVE CV LAB;  Service: Cardiovascular;  Laterality: N/A;   TENDON REPAIR Right 08/07/2020   Procedure: RIGHT HAND LIGAMENT RECONSTRUCTION AND TENDON INTERPOSITION;  Surgeon: Neil Balls, MD;  Location:  [facility];  Service: Orthopedics;  Laterality: Right;   TONSILLECTOMY AND ADENOIDECTOMY  1954   TOTAL HIP ARTHROPLASTY Left 2011   left (04/28/2012)   TOTAL HIP ARTHROPLASTY Right 07/31/2015   Procedure: TOTAL HIP ARTHROPLASTY ANTERIOR APPROACH;  Surgeon: Neil Balls, MD;  Location: [facility];  Service: Orthopedics;  Laterality: Right;   TOTAL KNEE ARTHROPLASTY  01/10/2012   Procedure: TOTAL KNEE ARTHROPLASTY;  Surgeon: Boston Byers, MD;  Location: [facility] OR;  Service: Orthopedics;;  left total knee arthroplasty   TOTAL KNEE ARTHROPLASTY Right 06/17/2014   Procedure: TOTAL KNEE ARTHROPLASTY;  Surgeon: Boston Byers, MD;  Location: [facility] OR;  Service: Orthopedics;  Laterality: Right;   toupet fundolplication  11/01/2021   TRANSESOPHAGEAL ECHOCARDIOGRAM (CATH LAB) N/A 03/24/2023   Procedure: TRANSESOPHAGEAL ECHOCARDIOGRAM;  Surgeon: Hazle Lites, MD;  Location: [facility] INVASIVE CV LAB;  Service: Cardiovascular;  Laterality: N/A;   Social History:  reports that he has never smoked. He has never used smokeless tobacco. He reports that he does not drink alcohol and does not use drugs.  Allergies  Allergen Reactions   Levaquin  [Levofloxacin ] Itching    Tolerated ciprofloxacin  in 2022.    Family History  Problem Relation Age of Onset   Diabetes Mother    Heart disease Father        Died 82, MI   Cancer Father    Hyperlipidemia Father    Stroke Father    Prostate cancer Brother    Colon polyps Brother    Colon polyps Brother    Asthma Brother    COPD Brother    Colon cancer Brother        Dx age 6   Cancer Brother    Colon cancer Other    Esophageal cancer Neg Hx    Rectal cancer Neg Hx    Stomach cancer Neg Hx    Pancreatic cancer Neg Hx    Kidney disease Neg Hx    Liver disease Neg Hx    Neuropathy Neg Hx     Prior to Admission medications   Medication Sig Start Date End Date Taking? Authorizing Provider  albuterol  (VENTOLIN  HFA) 108 (90 Base) MCG/ACT inhaler INHALE  2 PUFFS BY MOUTH EVERY 4 HOURS AS NEEDED FOR WHEEZE OR FOR SHORTNESS OF BREATH Patient taking differently: Inhale 1-2 puffs into the lungs every 4 (four) hours as  needed. 01/14/23  Yes Young, Rupert Counts D, MD  allopurinol  (ZYLOPRIM ) 100 MG tablet TAKE 1 TABLET (100 MG TOTAL) BY MOUTH IN THE MORNING 09/02/23  Yes Gean Keels, MD  atorvastatin  (LIPITOR) 20 MG tablet TAKE 1 TABLET BY MOUTH EVERY DAY 09/02/23  Yes Hugh Madura, MD  ciprofloxacin  (CIPRO ) 500 MG tablet Take 500 mg by mouth 2 (two) times daily.   Yes [provider]  cyclobenzaprine  (FLEXERIL ) 10 MG tablet Take 1 tablet (10 mg total) by mouth 3 (three) times daily as needed for muscle spasms. 09/04/23  Yes Gean Keels, MD  diclofenac  Sodium (VOLTAREN ) 1 % GEL Apply 4 g topically as needed (joint pain).   Yes [provider]  diltiazem  (CARDIZEM  CD) 180 MG 24 hr capsule Take 1 capsule (180 mg total) by mouth daily. 08/25/23  Yes Hugh Madura, MD  diltiazem  (CARDIZEM ) 30 MG tablet TAKE 1 TABLET (30 MG TOTAL) BY MOUTH AS NEEDED (HEART RATE OVER 120). 09/02/23  Yes Hugh Madura, MD  ELIQUIS  5 MG TABS tablet TAKE 1 TABLET BY MOUTH TWICE A DAY 07/28/23  Yes Hugh Madura, MD  ferrous gluconate  (FERGON) 324 MG tablet Take 1 tablet (324 mg total) by mouth daily. 06/18/23  Yes Tye Gall, MD  furosemide  (LASIX ) 40 MG tablet Take 1 tablet (40 mg total) by mouth daily. Patient taking differently: Take 40-80 mg by mouth daily as needed for edema. 05/27/23  Yes Tye Gall, MD  ipratropium-albuterol  (DUONEB) 0.5-2.5 (3) MG/3ML SOLN 1 NEBULE EVERY 6 HOURS AS NEEDED. **J45.3** Patient taking differently: Take 3 mLs by nebulization every 6 (six) hours as needed. 03/22/20  Yes Young, Rupert Counts D, MD  isosorbide  mononitrate (IMDUR ) 30 MG 24 hr tablet TAKE 1 TABLET BY MOUTH EVERY DAY 09/02/23  Yes Hugh Madura, MD  meclizine  (ANTIVERT ) 25 MG tablet TAKE 1 TABLET BY MOUTH 3 TIMES DAILY AS NEEDED FOR DIZZINESS.  07/03/23  Yes Gean Keels, MD  Olodaterol HCl (STRIVERDI RESPIMAT) 2.5 MCG/ACT AERS INHALE 2 PUFFS BY MOUTH INTO THE LUNGS DAILY Patient taking differently: Inhale 2 puffs into the lungs 2 (two) times daily. 03/10/23  Yes Young, Rupert Counts D, MD  ondansetron  (ZOFRAN -ODT) 8 MG disintegrating tablet Take 1 tablet (8 mg total) by mouth every 8 (eight) hours as needed for nausea. 04/07/23  Yes Gean Keels, MD  oxyCODONE -acetaminophen  (PERCOCET) 5-325 MG tablet Take 1 tablet by mouth every 4 (four) hours as needed for severe pain (pain score 7-10). 10/16/23  Yes Dot Gazella, DPM  pantoprazole  (PROTONIX ) 40 MG tablet TAKE 1 TABLET (40 MG TOTAL) BY MOUTH TWICE A DAY BEFORE MEALS 08/14/23  Yes Gean Keels, MD  predniSONE  (DELTASONE ) 10 MG tablet Take 20 mg by mouth daily with breakfast. 07/05/23  Yes [provider]  pregabalin  (LYRICA ) 100 MG capsule Take 1 capsule (100 mg total) by mouth 2 (two) times daily. 06/17/23  Yes Tye Gall, MD  tamsulosin  (FLOMAX ) 0.4 MG CAPS capsule Take 0.4 mg by mouth at bedtime.   Yes [provider]    Physical Exam: Vitals:   11/08/23 1006 11/08/23 1015 11/08/23 1034 11/08/23 1200  BP: 99/65  103/64 120/65  Pulse: (!) 104  (!) 103 (!) 109  Resp: (!) 34  20 20  Temp:  98.4 F (36.9 C)    TempSrc:  Oral    SpO2: 100%  94% 94%  Weight:      Height:       General: Alert,  oriented x3, resting comfortably in no acute distress Respiratory: Lungs clear to auscultation bilaterally with normal respiratory effort; no w/r/r Cardiovascular: Regular rate and rhythm w/o m/r/g Skin: RLE with diffuse swelling, erythema extending from ankle to calf; notably tender on exam as well  Data Reviewed:  Lab Results  Component Value Date   WBC 13.8 (H) 11/08/2023   HGB 11.6 (L) 11/08/2023   HCT 35.8 (L) 11/08/2023   MCV 96.0 11/08/2023   PLT 172 11/08/2023   Lab Results  Component Value Date   GLUCOSE 88 11/08/2023    CALCIUM  8.5 (L) 11/08/2023   NA 138 11/08/2023   K 2.9 (L) 11/08/2023   CO2 26 11/08/2023   CL 100 11/08/2023   BUN 17 11/08/2023   CREATININE 1.05 11/08/2023   Lab Results  Component Value Date   ALT 13 11/08/2023   AST 19 11/08/2023   ALKPHOS 66 11/08/2023   BILITOT 1.1 11/08/2023   Lab Results  Component Value Date   INR 1.4 (H) 11/08/2023   INR 1.8 (H) 03/19/2023   INR 0.98 07/25/2015    Radiology: DG Foot Complete Right Result Date: 11/08/2023 CLINICAL DATA:  A right foot infection. EXAM: RIGHT FOOT COMPLETE - 3+ VIEW COMPARISON:  Right foot series 10/22/2023 FINDINGS: Three views. There is diffuse moderate edema. Skin staples previously seen overlying the lateral midfoot have been removed in the interval and resolution of patchy soft tissue gas in the area is noted. There are heavy vascular calcifications. Osteopenia without evidence of fractures with old fifth toe amputation. Remote resection of the second through fourth proximal phalangeal heads. There is advanced Charcot change especially at the Lisfranc and tarsometatarsal joints, lateral subluxations as seen previously, pes planus and midfoot sag with chronic rocker bottom deformity. There is a small plantar calcaneal spur. No destructive bone lesions are seen. IMPRESSION: 1. Diffuse moderate edema. 2. Interval removal of skin staples overlying the lateral midfoot with resolution of patchy soft tissue gas in the area. 3. Advanced Charcot change with lateral metatarsal subluxations, pes planus and midfoot sag with chronic rocker bottom deformity. 4. Osteopenia. No evidence of fractures or focal destructive lesions. 5. Heavy vascular calcifications. 6. Old fifth toe amputation and remote resection of the second through fourth proximal phalangeal heads. Electronically Signed   By: Denman Fischer M.D.   On: 11/08/2023 06:36   CT CERVICAL SPINE WO CONTRAST Result Date: 11/08/2023 EXAM: CT CERVICAL SPINE WITHOUT CONTRAST 11/08/2023  05:17:53 AM TECHNIQUE: CT of the cervical was performed without the administration of intravenous contrast. Multiplanar reformatted images are provided for review. Automated exposure control, iterative reconstruction, and/or weight based adjustment of the mA/kV was utilized to reduce the radiation dose to as low as reasonably achievable. COMPARISON: None available. CLINICAL HISTORY: Polytrauma, blunt. Chief complaints; Level 2 FOT; CT HEAD WO CONTRAST; Head trauma, moderate-severe; CT CERVICAL SPINE WO CONTRAST; Polytrauma, blunt FINDINGS: CERVICAL SPINE: BONES AND ALIGNMENT: Solid fusion of the cervical spine is present at C4-5, C5-6, and C6-7. Hardware is intact. Straightening of the normal cervical lordosis is present. DEGENERATIVE CHANGES: No significant degenerative changes. SOFT TISSUES: No prevertebral soft tissue swelling. Mild atherosclerotic changes are present at the carotid bifurcations bilaterally without definite stenosis. IMPRESSION: 1. No acute abnormality of the cervical spine related to the polytrauma. 2. Solid fusion of the cervical spine at C4-5, C5-6, and C6-7 with intact hardware. Electronically signed by: Audree Leas MD 11/08/2023 05:29 AM EDT RP Workstation: [id number]   DG Chest Port 1 View Result Date:  11/08/2023 CLINICAL DATA:  Fall. EXAM: PORTABLE CHEST 1 VIEW COMPARISON:  None Available. FINDINGS: Low volume film. The cardio pericardial silhouette is enlarged. The lungs are clear without focal pneumonia, edema, pneumothorax or pleural effusion. Multiple nonacute rib fractures evident bilaterally. Status post left shoulder replacement. Telemetry leads overlie the chest. IMPRESSION: Low volume film without acute cardiopulmonary findings. Electronically Signed   By: Donnal Fusi M.D.   On: 11/08/2023 05:27   CT HEAD WO CONTRAST Result Date: 11/08/2023 EXAM: CT HEAD WITHOUT CONTRAST 11/08/2023 05:17:53 AM TECHNIQUE: CT of the head was performed without the administration of  intravenous contrast. Automated exposure control, iterative reconstruction, and/or weight based adjustment of the mA/kV was utilized to reduce the radiation dose to as low as reasonably achievable. COMPARISON: CT head without contrast from 09/13/2020. MR head without and with contrast from 05/21/2022. CLINICAL HISTORY: Head trauma, moderate-severe. Chief complaints; Level 2 FOT; Head trauma, moderate-severe; Polytrauma, blunt. FINDINGS: BRAIN AND VENTRICLES: There is no acute intracranial hemorrhage, mass effect or midline shift. No abnormal extra-axial fluid collection. The gray-white differentiation is maintained without an acute infarct. There is no hydrocephalus. Periventricular white matter hypoattenuation, mildly advanced for age, is stable. ORBITS: Bilateral lens replacements are noted. The globes and orbits are otherwise within normal limits. SINUSES: The visualized paranasal sinuses and mastoid air cells demonstrate no acute abnormality. SOFT TISSUES AND SKULL: No acute abnormality of the visualized skull or soft tissues. Atherosclerotic calcifications are present in the cavernous carotid arteries bilaterally. No hyperdense vessel is present. IMPRESSION: 1. No acute intracranial abnormality related to the head trauma. 2. Stable periventricular white matter hypoattenuation, mildly advanced for age. Electronically signed by: Audree Leas MD 11/08/2023 05:25 AM EDT RP Workstation: [id number]    Assessment and Plan: 37M h/o PAF on Eliquis , HFpEF (EF 40-45% in 02/2023), HTN, HLD, COPD, BPH, GERD, and s/p recent LLE 2nd digit/RLE  5th digit ray amputation on 08/13/2023 p/w presyncopal episode iso sepsis 2/2 RLE cellulitis vs OM.  Sepsis w/o septic shock as evidenced by lactate greater than 2 RLE cellulitis Possible RLE osteomyelitis S/p recent RLE 5th ray resection -Podiatry consulted; apprec eval/recs -MIVF: NS at 75cc/h for 1 day -Continue IV vanc/cefepime  for now -PTA lyrica  100mg  BID, and  percocet 5mg  q4h prn -F/u MRI RLE to eval for acute OM -F/u ESR/CRP -F/u of blood cultures; if positive, pt will need TTE to eval for IE  HFpEF PAF BLE edema noted on admission, but elevated lactate necessitates IVF administration at this time. Pt will need IV diuresis prior to d/c once sespis resolves. -IV diuresis when appropriate -PTA diltiazem  180mg  daily and apixaban  5mg  BID for now  HTN -PTA diltiazem  per above -HOLD pta Imdur  and Lasix   COPD -Brovana /pulmicort  nebs BID + Duonebs prn -Consider combination inhaler such as Breztri or Symbicort at discharge  BPH -PTA tamsulosin    Advance Care Planning:   Code Status: Full Code   Consults: Podiatry  Family Communication: Wife, Margretta Shi  Severity of Illness: The appropriate patient status for this patient is INPATIENT. Inpatient status is judged to be reasonable and necessary in order to provide the required intensity of service to ensure the patient's safety. The patient's presenting symptoms, physical exam findings, and initial radiographic and laboratory data in the context of their chronic comorbidities is felt to place them at high risk for further clinical deterioration. Furthermore, it is not anticipated that the patient will be medically stable for discharge from the hospital within 2 midnights of admission.   * I  certify that at the point of admission it is my clinical judgment that the patient will require inpatient hospital care spanning beyond 2 midnights from the point of admission due to high intensity of service, high risk for further deterioration and high frequency of surveillance required.*   ------- I spent 55 minutes reviewing previous labs/notes, obtaining separate history at the bedside, counseling/discussing the treatment plan outlined above, ordering medications/tests, and performing clinical documentation.  Author: Arne Langdon, MD 11/08/2023 12:26 PM  For on call review [email or web address].

## 2023-11-08 NOTE — Plan of Care (Signed)
  Problem: Clinical Measurements: Goal: Diagnostic test results will improve Outcome: Progressing   Problem: Pain Managment: Goal: General experience of comfort will improve and/or be controlled Outcome: Progressing   Problem: Safety: Goal: Ability to remain free from injury will improve Outcome: Progressing

## 2023-11-08 NOTE — ED Provider Notes (Signed)
 [place] EMERGENCY DEPARTMENT AT [facility] Provider Note   CSN: [id number] Arrival date & time: 11/08/23  0455     Patient presents with: fall on thinners   Levi [name] is a 78 y.o. male.   The history is provided by the EMS personnel. The history is limited by the condition of the patient (AMS).  Fall This is a new problem. The current episode started less than 1 hour ago. The problem occurs constantly. The problem has been resolved. Pertinent negatives include no chest pain, no abdominal pain, no headaches and no shortness of breath. Nothing aggravates the symptoms. Nothing relieves the symptoms. He has tried nothing for the symptoms. The treatment provided no relief.  Patient with CHF and low EF presents as a level 2 dall on thinners.  On antibiotics for osteomyelitis.      Past Medical History:  Diagnosis Date   Agent orange exposure 1970's   takes Imdur ,Diltiazem , and Atacand  daily   Agent orange exposure    ALLERGIC RHINITIS    Allergy     seasonal allergies   Anemia    iron  deficiency   Arthritis    Asthma    uses inhaler and nebulizers   Atrial fibrillation (HCC)    Bruises easily    d/t prednisone  daily   Cataract    Cellulitis 03/22/2020   Chest wall pain 09/21/2019   L scapula, reported 4/27 21/     CHF (congestive heart failure) (HCC)    dx-on meds to treat   Chronic bronchitis (HCC)    used to get it q yr; last time ?2008 (04/28/2012)   Clotting disorder (HCC) 2019   On blood thinner when I bleed it is for a consideral amount of time. This is tegardless of size of wound.   COPD (chronic obstructive pulmonary disease) (HCC)    agent orange exposure   Degenerative disk disease    everywhere (04/28/2012)   Diverticulitis    Dysrhythmia    afib   Elevated uric acid in blood    takes Allopurinol  daily   Emphysema of lung (HCC)    Enlarged prostate    but not on any meds   GERD (gastroesophageal reflux disease)    takes Protonix  daily    H/O hiatal hernia    Headache    History of colonoscopy    History of pneumonia    a. 2010   Hyperlipidemia    takes Lipitor daily; pt. states he takes a preventive   Hypothyroidism    Joint pain    Joint swelling    Leukocytosis    MSSA bacteremia 03/19/2023   Nausea and vomiting 11/12/2022   Neuromuscular disorder (HCC)    bilat legs, and bilat arms   Neuropathy    Osteoporosis 2012   PAF (paroxysmal atrial fibrillation) (HCC)    a. Dx 04/2012, CHA2DS2VASc = 1 (age);  b. 04/2012 Echo: EF 40-50%, mild MR.   Personal history of colonic adenomas 02/09/2013   Pneumonia    Sepsis (HCC) 03/18/2023   Shortness of breath dyspnea    Thyroid  disease    no taking meds-caused dizziness     Prior to Admission medications   Medication Sig Start Date End Date Taking? Authorizing Provider  albuterol  (VENTOLIN  HFA) 108 (90 Base) MCG/ACT inhaler INHALE 2 PUFFS BY MOUTH EVERY 4 HOURS AS NEEDED FOR WHEEZE OR FOR SHORTNESS OF BREATH Patient taking differently: Inhale 1-2 puffs into the lungs every 4 (four) hours as needed. 01/14/23  Yes Rosa College D, MD  allopurinol  (ZYLOPRIM ) 100 MG tablet TAKE 1 TABLET (100 MG TOTAL) BY MOUTH IN THE MORNING 09/02/23  Yes Gean Keels, MD  atorvastatin  (LIPITOR) 20 MG tablet TAKE 1 TABLET BY MOUTH EVERY DAY 09/02/23  Yes Hugh Madura, MD  ciprofloxacin  (CIPRO ) 500 MG tablet Take 500 mg by mouth 2 (two) times daily.   Yes [provider]  cyclobenzaprine  (FLEXERIL ) 10 MG tablet Take 1 tablet (10 mg total) by mouth 3 (three) times daily as needed for muscle spasms. 09/04/23  Yes Gean Keels, MD  diclofenac  Sodium (VOLTAREN ) 1 % GEL Apply 4 g topically as needed (joint pain).   Yes [provider]  diltiazem  (CARDIZEM  CD) 180 MG 24 hr capsule Take 1 capsule (180 mg total) by mouth daily. 08/25/23  Yes Hugh Madura, MD  diltiazem  (CARDIZEM ) 30 MG tablet TAKE 1 TABLET (30 MG TOTAL) BY MOUTH AS NEEDED (HEART RATE OVER 120).  09/02/23  Yes Hugh Madura, MD  ELIQUIS  5 MG TABS tablet TAKE 1 TABLET BY MOUTH TWICE A DAY 07/28/23  Yes Hugh Madura, MD  ferrous gluconate  (FERGON) 324 MG tablet Take 1 tablet (324 mg total) by mouth daily. 06/18/23  Yes Tye Gall, MD  furosemide  (LASIX ) 40 MG tablet Take 1 tablet (40 mg total) by mouth daily. Patient taking differently: Take 40-80 mg by mouth daily as needed for edema. 05/27/23  Yes Tye Gall, MD  ipratropium-albuterol  (DUONEB) 0.5-2.5 (3) MG/3ML SOLN 1 NEBULE EVERY 6 HOURS AS NEEDED. **J45.3** Patient taking differently: Take 3 mLs by nebulization every 6 (six) hours as needed. 03/22/20  Yes Young, Rupert Counts D, MD  isosorbide  mononitrate (IMDUR ) 30 MG 24 hr tablet TAKE 1 TABLET BY MOUTH EVERY DAY 09/02/23  Yes Hugh Madura, MD  meclizine  (ANTIVERT ) 25 MG tablet TAKE 1 TABLET BY MOUTH 3 TIMES DAILY AS NEEDED FOR DIZZINESS. 07/03/23  Yes Gean Keels, MD  Olodaterol HCl (STRIVERDI RESPIMAT) 2.5 MCG/ACT AERS INHALE 2 PUFFS BY MOUTH INTO THE LUNGS DAILY Patient taking differently: Inhale 2 puffs into the lungs 2 (two) times daily. 03/10/23  Yes Young, Rupert Counts D, MD  ondansetron  (ZOFRAN -ODT) 8 MG disintegrating tablet Take 1 tablet (8 mg total) by mouth every 8 (eight) hours as needed for nausea. 04/07/23  Yes Gean Keels, MD  oxyCODONE -acetaminophen  (PERCOCET) 5-325 MG tablet Take 1 tablet by mouth every 4 (four) hours as needed for severe pain (pain score 7-10). 10/16/23  Yes Dot Gazella, DPM  pantoprazole  (PROTONIX ) 40 MG tablet TAKE 1 TABLET (40 MG TOTAL) BY MOUTH TWICE A DAY BEFORE MEALS 08/14/23  Yes Gean Keels, MD  predniSONE  (DELTASONE ) 10 MG tablet Take 20 mg by mouth daily with breakfast. 07/05/23  Yes [provider]  pregabalin  (LYRICA ) 100 MG capsule Take 1 capsule (100 mg total) by mouth 2 (two) times daily. 06/17/23  Yes Tye Gall, MD  tamsulosin  (FLOMAX ) 0.4 MG CAPS capsule Take 0.4 mg by mouth at  bedtime.   Yes [provider]    Allergies: Levaquin  [levofloxacin ]    Review of Systems  Unable to perform ROS: Acuity of condition  HENT:  Negative for facial swelling.   Respiratory:  Negative for shortness of breath.   Cardiovascular:  Negative for chest pain.  Gastrointestinal:  Negative for abdominal pain and vomiting.  Skin:  Positive for color change.  Neurological:  Negative for headaches.    Updated Vital Signs BP 100/68   Pulse Aaron Aas)  110   Temp 98.1 F (36.7 C) (Temporal)   Resp (!) 23   Ht 6' 2 (1.88 m)   Wt 93 kg   SpO2 96%   BMI 26.32 kg/m   Physical Exam Vitals and nursing note reviewed.  Constitutional:      General: He is not in acute distress.    Appearance: Normal appearance. He is well-developed. He is not diaphoretic.  HENT:     Head: Normocephalic and atraumatic.     Nose: Nose normal.   Eyes:     Conjunctiva/sclera: Conjunctivae normal.     Pupils: Pupils are equal, round, and reactive to light.    Cardiovascular:     Rate and Rhythm: Regular rhythm. Tachycardia present.     Pulses: Normal pulses.     Heart sounds: Normal heart sounds.  Pulmonary:     Effort: Pulmonary effort is normal.     Breath sounds: Normal breath sounds. No wheezing or rales.  Abdominal:     General: Bowel sounds are normal.     Palpations: Abdomen is soft.     Tenderness: There is no abdominal tenderness. There is no guarding or rebound.   Musculoskeletal:        General: Normal range of motion.     Cervical back: Normal range of motion and neck supple.       Feet:   Skin:    General: Skin is warm and dry.     Capillary Refill: Capillary refill takes less than 2 seconds.     Findings: Erythema present.       Neurological:     General: No focal deficit present.     Mental Status: He is alert.     Deep Tendon Reflexes: Reflexes normal.   Psychiatric:        Mood and Affect: Mood normal.     (all labs ordered are listed, but only abnormal  results are displayed) Results for orders placed or performed during the hospital encounter of 11/08/23  Sample to Blood Bank   Collection Time: 11/08/23  5:05 AM  Result Value Ref Range   Blood Bank Specimen SAMPLE AVAILABLE FOR TESTING    Sample Expiration      11/11/2023,2359 Performed at [facility], [address] [place], [place] [address]   I-Stat Chem 8, ED   Collection Time: 11/08/23  5:11 AM  Result Value Ref Range   Sodium 137 135 - 145 mmol/L   Potassium 2.9 (L) 3.5 - 5.1 mmol/L   Chloride 98 98 - 111 mmol/L   BUN 17 8 - 23 mg/dL   Creatinine, Ser 6.04 0.61 - 1.24 mg/dL   Glucose, Bld 83 70 - 99 mg/dL   Calcium , Ion 1.06 (L) 1.15 - 1.40 mmol/L   TCO2 25 22 - 32 mmol/L   Hemoglobin 11.6 (L) 13.0 - 17.0 g/dL   HCT 54.0 (L) 98.1 - 19.1 %  I-Stat Lactic Acid, ED   Collection Time: 11/08/23  5:12 AM  Result Value Ref Range   Lactic Acid, Venous 2.4 (HH) 0.5 - 1.9 mmol/L   Comment NOTIFIED PHYSICIAN   Comprehensive metabolic panel   Collection Time: 11/08/23  5:14 AM  Result Value Ref Range   Sodium 138 135 - 145 mmol/L   Potassium 2.9 (L) 3.5 - 5.1 mmol/L   Chloride 100 98 - 111 mmol/L   CO2 26 22 - 32 mmol/L   Glucose, Bld 88 70 - 99 mg/dL   BUN 17  8 - 23 mg/dL   Creatinine, Ser 1.47 0.61 - 1.24 mg/dL   Calcium  8.5 (L) 8.9 - 10.3 mg/dL   Total Protein 5.6 (L) 6.5 - 8.1 g/dL   Albumin 2.9 (L) 3.5 - 5.0 g/dL   AST 19 15 - 41 U/L   ALT 13 0 - 44 U/L   Alkaline Phosphatase 66 38 - 126 U/L   Total Bilirubin 1.1 0.0 - 1.2 mg/dL   GFR, Estimated >82 >95 mL/min   Anion gap 12 5 - 15  CBC   Collection Time: 11/08/23  5:14 AM  Result Value Ref Range   WBC 13.8 (H) 4.0 - 10.5 K/uL   RBC 3.73 (L) 4.22 - 5.81 MIL/uL   Hemoglobin 11.6 (L) 13.0 - 17.0 g/dL   HCT 62.1 (L) 30.8 - 65.7 %   MCV 96.0 80.0 - 100.0 fL   MCH 31.1 26.0 - 34.0 pg   MCHC 32.4 30.0 - 36.0 g/dL   RDW 84.6 96.2 - 95.2 %   Platelets 172 150 - 400 K/uL   nRBC 0.0 0.0 - 0.2 %  Ethanol    Collection Time: 11/08/23  5:14 AM  Result Value Ref Range   Alcohol, Ethyl (B) <15 <15 mg/dL  Protime-INR   Collection Time: 11/08/23  5:14 AM  Result Value Ref Range   Prothrombin Time 17.8 (H) 11.4 - 15.2 seconds   INR 1.4 (H) 0.8 - 1.2   *Note: Due to a large number of results and/or encounters for the requested time period, some results have not been displayed. A complete set of results can be found in Results Review.   DG Foot Complete Right Result Date: 11/08/2023 CLINICAL DATA:  A right foot infection. EXAM: RIGHT FOOT COMPLETE - 3+ VIEW COMPARISON:  Right foot series 10/22/2023 FINDINGS: Three views. There is diffuse moderate edema. Skin staples previously seen overlying the lateral midfoot have been removed in the interval and resolution of patchy soft tissue gas in the area is noted. There are heavy vascular calcifications. Osteopenia without evidence of fractures with old fifth toe amputation. Remote resection of the second through fourth proximal phalangeal heads. There is advanced Charcot change especially at the Lisfranc and tarsometatarsal joints, lateral subluxations as seen previously, pes planus and midfoot sag with chronic rocker bottom deformity. There is a small plantar calcaneal spur. No destructive bone lesions are seen. IMPRESSION: 1. Diffuse moderate edema. 2. Interval removal of skin staples overlying the lateral midfoot with resolution of patchy soft tissue gas in the area. 3. Advanced Charcot change with lateral metatarsal subluxations, pes planus and midfoot sag with chronic rocker bottom deformity. 4. Osteopenia. No evidence of fractures or focal destructive lesions. 5. Heavy vascular calcifications. 6. Old fifth toe amputation and remote resection of the second through fourth proximal phalangeal heads. Electronically Signed   By: Denman Fischer M.D.   On: 11/08/2023 06:36   CT CERVICAL SPINE WO CONTRAST Result Date: 11/08/2023 EXAM: CT CERVICAL SPINE WITHOUT CONTRAST  11/08/2023 05:17:53 AM TECHNIQUE: CT of the cervical was performed without the administration of intravenous contrast. Multiplanar reformatted images are provided for review. Automated exposure control, iterative reconstruction, and/or weight based adjustment of the mA/kV was utilized to reduce the radiation dose to as low as reasonably achievable. COMPARISON: None available. CLINICAL HISTORY: Polytrauma, blunt. Chief complaints; Level 2 FOT; CT HEAD WO CONTRAST; Head trauma, moderate-severe; CT CERVICAL SPINE WO CONTRAST; Polytrauma, blunt FINDINGS: CERVICAL SPINE: BONES AND ALIGNMENT: Solid fusion of the cervical spine is present  at C4-5, C5-6, and C6-7. Hardware is intact. Straightening of the normal cervical lordosis is present. DEGENERATIVE CHANGES: No significant degenerative changes. SOFT TISSUES: No prevertebral soft tissue swelling. Mild atherosclerotic changes are present at the carotid bifurcations bilaterally without definite stenosis. IMPRESSION: 1. No acute abnormality of the cervical spine related to the polytrauma. 2. Solid fusion of the cervical spine at C4-5, C5-6, and C6-7 with intact hardware. Electronically signed by: Audree Leas MD 11/08/2023 05:29 AM EDT RP Workstation: [id number]   DG Chest Port 1 View Result Date: 11/08/2023 CLINICAL DATA:  Fall. EXAM: PORTABLE CHEST 1 VIEW COMPARISON:  None Available. FINDINGS: Low volume film. The cardio pericardial silhouette is enlarged. The lungs are clear without focal pneumonia, edema, pneumothorax or pleural effusion. Multiple nonacute rib fractures evident bilaterally. Status post left shoulder replacement. Telemetry leads overlie the chest. IMPRESSION: Low volume film without acute cardiopulmonary findings. Electronically Signed   By: Donnal Fusi M.D.   On: 11/08/2023 05:27   CT HEAD WO CONTRAST Result Date: 11/08/2023 EXAM: CT HEAD WITHOUT CONTRAST 11/08/2023 05:17:53 AM TECHNIQUE: CT of the head was performed without the  administration of intravenous contrast. Automated exposure control, iterative reconstruction, and/or weight based adjustment of the mA/kV was utilized to reduce the radiation dose to as low as reasonably achievable. COMPARISON: CT head without contrast from 09/13/2020. MR head without and with contrast from 05/21/2022. CLINICAL HISTORY: Head trauma, moderate-severe. Chief complaints; Level 2 FOT; Head trauma, moderate-severe; Polytrauma, blunt. FINDINGS: BRAIN AND VENTRICLES: There is no acute intracranial hemorrhage, mass effect or midline shift. No abnormal extra-axial fluid collection. The gray-white differentiation is maintained without an acute infarct. There is no hydrocephalus. Periventricular white matter hypoattenuation, mildly advanced for age, is stable. ORBITS: Bilateral lens replacements are noted. The globes and orbits are otherwise within normal limits. SINUSES: The visualized paranasal sinuses and mastoid air cells demonstrate no acute abnormality. SOFT TISSUES AND SKULL: No acute abnormality of the visualized skull or soft tissues. Atherosclerotic calcifications are present in the cavernous carotid arteries bilaterally. No hyperdense vessel is present. IMPRESSION: 1. No acute intracranial abnormality related to the head trauma. 2. Stable periventricular white matter hypoattenuation, mildly advanced for age. Electronically signed by: Audree Leas MD 11/08/2023 05:25 AM EDT RP Workstation: [id number]   DG Foot Complete Right Result Date: 10/22/2023 Please see detailed radiograph report in office note.  DG Foot Complete Right Result Date: 10/13/2023 Please see detailed radiograph report in office note.    EKG: EKG Interpretation Date/Time:  Saturday November 08 2023 05:18:42 EDT Ventricular Rate:  105 PR Interval:  139 QRS Duration:  94 QT Interval:  344 QTC Calculation: 455 R Axis:   -22  Text Interpretation: Sinus tachycardia Probable left atrial enlargement Borderline left  axis deviation Low voltage, precordial leads Abnormal R-wave progression, early transition Confirmed by Maralee Senate, Ifeoluwa Bartz ([id number]) on 11/08/2023 5:31:54 AM  Radiology: Lenell Query Foot Complete Right Result Date: 11/08/2023 CLINICAL DATA:  A right foot infection. EXAM: RIGHT FOOT COMPLETE - 3+ VIEW COMPARISON:  Right foot series 10/22/2023 FINDINGS: Three views. There is diffuse moderate edema. Skin staples previously seen overlying the lateral midfoot have been removed in the interval and resolution of patchy soft tissue gas in the area is noted. There are heavy vascular calcifications. Osteopenia without evidence of fractures with old fifth toe amputation. Remote resection of the second through fourth proximal phalangeal heads. There is advanced Charcot change especially at the Lisfranc and tarsometatarsal joints, lateral subluxations as seen previously, pes planus and midfoot sag with  chronic rocker bottom deformity. There is a small plantar calcaneal spur. No destructive bone lesions are seen. IMPRESSION: 1. Diffuse moderate edema. 2. Interval removal of skin staples overlying the lateral midfoot with resolution of patchy soft tissue gas in the area. 3. Advanced Charcot change with lateral metatarsal subluxations, pes planus and midfoot sag with chronic rocker bottom deformity. 4. Osteopenia. No evidence of fractures or focal destructive lesions. 5. Heavy vascular calcifications. 6. Old fifth toe amputation and remote resection of the second through fourth proximal phalangeal heads. Electronically Signed   By: Denman Fischer M.D.   On: 11/08/2023 06:36   CT CERVICAL SPINE WO CONTRAST Result Date: 11/08/2023 EXAM: CT CERVICAL SPINE WITHOUT CONTRAST 11/08/2023 05:17:53 AM TECHNIQUE: CT of the cervical was performed without the administration of intravenous contrast. Multiplanar reformatted images are provided for review. Automated exposure control, iterative reconstruction, and/or weight based adjustment of the mA/kV was  utilized to reduce the radiation dose to as low as reasonably achievable. COMPARISON: None available. CLINICAL HISTORY: Polytrauma, blunt. Chief complaints; Level 2 FOT; CT HEAD WO CONTRAST; Head trauma, moderate-severe; CT CERVICAL SPINE WO CONTRAST; Polytrauma, blunt FINDINGS: CERVICAL SPINE: BONES AND ALIGNMENT: Solid fusion of the cervical spine is present at C4-5, C5-6, and C6-7. Hardware is intact. Straightening of the normal cervical lordosis is present. DEGENERATIVE CHANGES: No significant degenerative changes. SOFT TISSUES: No prevertebral soft tissue swelling. Mild atherosclerotic changes are present at the carotid bifurcations bilaterally without definite stenosis. IMPRESSION: 1. No acute abnormality of the cervical spine related to the polytrauma. 2. Solid fusion of the cervical spine at C4-5, C5-6, and C6-7 with intact hardware. Electronically signed by: Audree Leas MD 11/08/2023 05:29 AM EDT RP Workstation: [id number]   DG Chest Port 1 View Result Date: 11/08/2023 CLINICAL DATA:  Fall. EXAM: PORTABLE CHEST 1 VIEW COMPARISON:  None Available. FINDINGS: Low volume film. The cardio pericardial silhouette is enlarged. The lungs are clear without focal pneumonia, edema, pneumothorax or pleural effusion. Multiple nonacute rib fractures evident bilaterally. Status post left shoulder replacement. Telemetry leads overlie the chest. IMPRESSION: Low volume film without acute cardiopulmonary findings. Electronically Signed   By: Donnal Fusi M.D.   On: 11/08/2023 05:27   CT HEAD WO CONTRAST Result Date: 11/08/2023 EXAM: CT HEAD WITHOUT CONTRAST 11/08/2023 05:17:53 AM TECHNIQUE: CT of the head was performed without the administration of intravenous contrast. Automated exposure control, iterative reconstruction, and/or weight based adjustment of the mA/kV was utilized to reduce the radiation dose to as low as reasonably achievable. COMPARISON: CT head without contrast from 09/13/2020. MR head without  and with contrast from 05/21/2022. CLINICAL HISTORY: Head trauma, moderate-severe. Chief complaints; Level 2 FOT; Head trauma, moderate-severe; Polytrauma, blunt. FINDINGS: BRAIN AND VENTRICLES: There is no acute intracranial hemorrhage, mass effect or midline shift. No abnormal extra-axial fluid collection. The gray-white differentiation is maintained without an acute infarct. There is no hydrocephalus. Periventricular white matter hypoattenuation, mildly advanced for age, is stable. ORBITS: Bilateral lens replacements are noted. The globes and orbits are otherwise within normal limits. SINUSES: The visualized paranasal sinuses and mastoid air cells demonstrate no acute abnormality. SOFT TISSUES AND SKULL: No acute abnormality of the visualized skull or soft tissues. Atherosclerotic calcifications are present in the cavernous carotid arteries bilaterally. No hyperdense vessel is present. IMPRESSION: 1. No acute intracranial abnormality related to the head trauma. 2. Stable periventricular white matter hypoattenuation, mildly advanced for age. Electronically signed by: Audree Leas MD 11/08/2023 05:25 AM EDT RP Workstation: [id number]     .Critical  Care  Performed by: Tonya Fredrickson, MD Authorized by: Tonya Fredrickson, MD   Critical care provider statement:    Critical care time (minutes):  60   Critical care end time:  11/08/2023 7:15 AM   Critical care was necessary to treat or prevent imminent or life-threatening deterioration of the following conditions:  Trauma and sepsis   Critical care was time spent personally by me on the following activities:  Development of treatment plan with patient or surrogate, discussions with consultants, evaluation of patient's response to treatment, examination of patient, ordering and review of laboratory studies, ordering and review of radiographic studies, ordering and performing treatments and interventions, pulse oximetry, re-evaluation of patient's condition  and review of old charts   I assumed direction of critical care for this patient from another provider in my specialty: no     Care discussed with: admitting provider      Medications Ordered in the ED  vancomycin  (VANCOREADY) IVPB 2000 mg/400 mL (has no administration in time range)  magnesium  sulfate IVPB 2 g 50 mL (2 g Intravenous New Bag/Given 11/08/23 0657)  Tdap (BOOSTRIX) injection 0.5 mL (0.5 mLs Intramuscular Given 11/08/23 0529)  ceFEPIme  (MAXIPIME ) 2 g in sodium chloride  0.9 % 100 mL IVPB (0 g Intravenous Stopped 11/08/23 0635)  metroNIDAZOLE  (FLAGYL ) IVPB 500 mg (0 mg Intravenous Stopped 11/08/23 0659)  potassium chloride  SA (KLOR-CON  M) CR tablet 60 mEq (60 mEq Oral Given 11/08/23 0659)  sodium chloride  0.9 % bolus 1,000 mL (1,000 mLs Intravenous New Bag/Given 11/08/23 0658)                                    Medical Decision Making Level 2 fall on thinners   Amount and/or Complexity of Data Reviewed Independent Historian: EMS    Details: See above  External Data Reviewed: notes.    Details: Previous notes reviewed  Labs: ordered.    Details: Elevated lactate 2.4, normal sodium 137, low potassium 2.9, normal creatinine 1.05, elevated white count 13.8, low hemoglobin 11.6, normal platelets  Radiology: ordered.    Details: No PNA by me  ECG/medicine tests: ordered and independent interpretation performed. Decision-making details documented in ED Course.  Risk Prescription drug management. Decision regarding hospitalization.     Final diagnoses:  Sepsis, due to unspecified organism, unspecified whether acute organ dysfunction present [facility])  Fall, initial encounter  Cellulitis of right lower extremity   The patient appears reasonably stabilized for admission considering the current resources, flow, and capabilities available in the ED at this time, and I doubt any other [facility] requiring further screening and/or treatment in the ED prior to admission.  ED Discharge Orders      None          Haedyn Breau, MD 11/08/23 [phone]

## 2023-11-08 NOTE — ED Notes (Signed)
 C-Collar placed on pt

## 2023-11-08 NOTE — ED Triage Notes (Signed)
 Pt BIB [facility] from home. Pt had mechanical fall, hitting his head on the wall. Denies LOC. Pt takes Eliquis . Abrasion on pts L elbow and R forearm. GCS 15

## 2023-11-08 NOTE — Progress Notes (Signed)
 Pharmacy Antibiotic Note  Levi [name] is a 78 y.o. male admitted on 11/08/2023 with wound infection.  Pharmacy has been consulted for vancomycin  and cefepime  dosing. On ciprofloxacin  prior to admission for left wound infection - prescribed by [facility].  WBC 13.8 and afebrile. Renal function near baseline.  Plan: Vancomycin  2000mg  IV every 24 hours (eAUC 480, Scr 1.05) Cefepime  2g IV Q8H F/u cultures and clinical progression  Height: 6' 2 (188 cm) Weight: 93 kg (205 lb) IBW/kg (Calculated) : 82.2  Temp (24hrs), Avg:98.3 F (36.8 C), Min:98.1 F (36.7 C), Max:98.4 F (36.9 C)  Recent Labs  Lab 11/03/23 1058 11/03/23 1113 11/08/23 0511 11/08/23 0512 11/08/23 0514  WBC  --  7.6  --   --  13.8*  CREATININE 0.82  --  1.00  --  1.05  LATICACIDVEN  --   --   --  2.4*  --     Estimated Creatinine Clearance: 69.6 mL/min (by C-G formula based on SCr of 1.05 mg/dL).    Allergies  Allergen Reactions   Levaquin  [Levofloxacin ] Itching    Tolerated ciprofloxacin  in 2022.    Antimicrobials this admission: Vancomycin  6/14 >>  Cefepime  6/14 >> Metronidazole  6/14 x1  Microbiology results: 6/14 BCx: in process  Thank you for allowing pharmacy to be a part of this patient's care.  Marybelle Smiling 11/08/2023 11:13 AM

## 2023-11-09 DIAGNOSIS — L089 Local infection of the skin and subcutaneous tissue, unspecified: Secondary | ICD-10-CM | POA: Diagnosis not present

## 2023-11-09 LAB — RESPIRATORY PANEL W/ SARS-COV2
Adenovirus: NOT DETECTED
Bordetella parapertussis: NOT DETECTED
Bordetella pertussis: NOT DETECTED
Chlamydophila pneumoniae: NOT DETECTED
Coronavirus 229E: NOT DETECTED
Coronavirus HKU1: NOT DETECTED
Coronavirus NL63: NOT DETECTED
Coronavirus OC43: NOT DETECTED
Human Metapneumovirus: NOT DETECTED
Human Rhinovirus/Enterovirus: NOT DETECTED
Influenza A/H1-2009: NOT DETECTED
Influenza A/H1: NOT DETECTED
Influenza A/H3: NOT DETECTED
Influenza A: NOT DETECTED
Influenza B: NOT DETECTED
Mycoplasma pneumoniae: NOT DETECTED
Parainfluenza 1: NOT DETECTED
Parainfluenza 2: NOT DETECTED
Parainfluenza 3: NOT DETECTED
Parainfluenza 4: NOT DETECTED
Respiratory Syncytial Virus: NOT DETECTED
SARS-CoV-2: NOT DETECTED

## 2023-11-09 LAB — CBC
HCT: 32 % — ABNORMAL LOW (ref 39.0–52.0)
Hemoglobin: 10.1 g/dL — ABNORMAL LOW (ref 13.0–17.0)
MCH: 30.3 pg (ref 26.0–34.0)
MCHC: 31.6 g/dL (ref 30.0–36.0)
MCV: 96.1 fL (ref 80.0–100.0)
Platelets: 144 10*3/uL — ABNORMAL LOW (ref 150–400)
RBC: 3.33 MIL/uL — ABNORMAL LOW (ref 4.22–5.81)
RDW: 15.3 % (ref 11.5–15.5)
WBC: 10.3 10*3/uL (ref 4.0–10.5)
nRBC: 0 % (ref 0.0–0.2)

## 2023-11-09 LAB — BASIC METABOLIC PANEL WITH GFR
Anion gap: 10 (ref 5–15)
BUN: 18 mg/dL (ref 8–23)
CO2: 23 mmol/L (ref 22–32)
Calcium: 8.2 mg/dL — ABNORMAL LOW (ref 8.9–10.3)
Chloride: 105 mmol/L (ref 98–111)
Creatinine, Ser: 0.78 mg/dL (ref 0.61–1.24)
GFR, Estimated: >60 mL/min (ref >60)
Glucose, Bld: 107 mg/dL — ABNORMAL HIGH (ref 70–99)
Potassium: 3.9 mmol/L (ref 3.5–5.1)
Sodium: 138 mmol/L (ref 135–145)

## 2023-11-09 LAB — CULTURE, BLOOD (SINGLE)

## 2023-11-09 MED ORDER — VANCOMYCIN HCL 1.25 G IV SOLR
1250.0000 mg | Freq: Two times a day (BID) | INTRAVENOUS | Status: DC
  Filled 2023-11-09 (×2): qty 25

## 2023-11-09 MED ORDER — VANCOMYCIN HCL 1250 MG/250ML IV SOLN
1250.0000 mg | Freq: Two times a day (BID) | INTRAVENOUS | Status: DC
  Administered 2023-11-09 – 2023-11-10 (×4): 1250 mg via INTRAVENOUS
  Filled 2023-11-09 (×5): qty 250

## 2023-11-09 NOTE — Plan of Care (Signed)

## 2023-11-09 NOTE — Evaluation (Signed)
 Physical Therapy Evaluation Patient Details Name: Levi [name] MRN: [id number] DOB: 09-Aug-1945 Today's Date: 11/09/2023  History of Present Illness  78 yo male presents to [facility] on 6/14 with mechanical fall + head trauma. Workup for septic shock due to RLE cellulitis and possible osteomyelitis. CTH and CT Cspine negative for acute findings. PMHx:  admission 03/2023 for RLE wound and cellulitis, agent orange exposure, afib, neuropathy, osteoporosis, CHF, COPD, HLD and venous insufficiency of both lower extremities, PAF, L 2nd and 5th toe amputations 07/2023, recent RLE 5th ray resection, ACDF 2014 and 2023, lumbar fusion 2018, x5 joint replacements.  Clinical Impression   Pt presents with LE weakness, bilat foot pain R>L, impaired balance with history of falls, neck pain related to fall, poor activity tolerance vs baseline. Pt to benefit from acute PT to address deficits. Pt requiring up to mod physical assist for transfer-level mobility at this time, pt endorses being ambulatory with rollator at baseline and only needs occasional assist from wife for ADLs. Patient will benefit from intensive inpatient follow-up therapy, >3 hours/day. PT to progress mobility as tolerated, and will continue to follow acutely.          If plan is discharge home, recommend the following: A lot of help with walking and/or transfers;A lot of help with bathing/dressing/bathroom;Assist for transportation;Help with stairs or ramp for entrance   Can travel by private vehicle        Equipment Recommendations Wheelchair (measurements PT);Wheelchair cushion (measurements PT)  Recommendations for Other Services       Functional Status Assessment Patient has had a recent decline in their functional status and demonstrates the ability to make significant improvements in function in a reasonable and predictable amount of time.     Precautions / Restrictions Precautions Precautions: Fall Restrictions Weight Bearing  Restrictions Per Provider Order: No Other Position/Activity Restrictions: await podiatry consult for RLE      Mobility  Bed Mobility Overal bed mobility: Needs Assistance Bed Mobility: Supine to Sit, Sit to Supine     Supine to sit: Mod assist, Used rails, HOB elevated     General bed mobility comments: assist for trunk rise, scooting towards EOB, steadying. cues for sequencing    Transfers Overall transfer level: Needs assistance Equipment used: Rolling walker (2 wheels) Transfers: Sit to/from Stand Sit to Stand: Min assist, From elevated surface           General transfer comment: assist for power up, rise, steady. cues for hand placement when rising and sitting. Stand pivot towards recliner towards R, PT deferring further mobility since pt has not seen podiatry yet.    Ambulation/Gait                  Stairs            Wheelchair Mobility     Tilt Bed    Modified Rankin (Stroke Patients Only)       Balance Overall balance assessment: Needs assistance, History of Falls Sitting-balance support: No upper extremity supported, Feet supported Sitting balance-[name] Scale: Fair     Standing balance support: Bilateral upper extremity supported, During functional activity, Reliant on assistive device for balance Standing balance-[name] Scale: Poor                               Pertinent Vitals/Pain Pain Assessment Pain Assessment: Faces Faces Pain Scale: Hurts little more Pain Location: R>L foot; L side neck s/p fall  Pain Descriptors / Indicators: Grimacing, Guarding, Discomfort, Sore Pain Intervention(s): Limited activity within patient's tolerance, Monitored during session, Repositioned, Heat applied (heat pack for neck, given by RN)    Home Living Family/patient expects to be discharged to:: Private residence Living Arrangements: Spouse/significant other Available Help at Discharge: Family;Available 24 hours/day Type of Home:  House Home Access: Stairs to enter;Level entry;Other (comment) (has been living in the basement so has a level entry, has been there for x1 year)   Entrance Stairs-Number of Steps: 3   Home Layout: Able to live on main level with bedroom/bathroom Home Equipment: Control and instrumentation engineer (2 wheels);Cane - single point;Rollator (4 wheels)      Prior Function Prior Level of Function : Needs assist;History of Falls (last six months);Driving             Mobility Comments: using rollator for gait, walks some community ditances ADLs Comments: wife assisting with LB dressing, indep with showering. Wife cooks and cleans, also has a cleaner come in 2x/month     Extremity/Trunk Assessment   Upper Extremity Assessment Upper Extremity Assessment: Defer to OT evaluation    Lower Extremity Assessment Lower Extremity Assessment: Generalized weakness;RLE deficits/detail;LLE deficits/detail RLE Deficits / Details: unable to accept resistance due to pain; full AROM knee extension/flexion, limited ROM DF/PF due to pain RLE: Unable to fully assess due to pain RLE Sensation: history of peripheral neuropathy LLE Deficits / Details: unable to accept resistance due to pain; full AROM knee extension/flexion, limited ROM DF/PF due to pain LLE: Unable to fully assess due to pain LLE Sensation: history of peripheral neuropathy    Cervical / Trunk Assessment Cervical / Trunk Assessment: Back Surgery;Neck Surgery  Communication   Communication Communication: No apparent difficulties    Cognition Arousal: Alert Behavior During Therapy: WFL for tasks assessed/performed   PT - Cognitive impairments: No apparent impairments                                 Cueing Cueing Techniques: Verbal cues, Gestural cues     General Comments General comments (skin integrity, edema, etc.): SPO2 86-94% on RA, pleth is inaccurate at times. LE edema R>L with redness noted on R lower leg. R  charcot foot. HRmax observed 135 bpm with transfer    Exercises     Assessment/Plan    PT Assessment Patient needs continued PT services  PT Problem List Decreased strength;Decreased mobility;Decreased activity tolerance;Decreased balance;Decreased knowledge of use of DME;Pain;Impaired sensation;Cardiopulmonary status limiting activity;Decreased knowledge of precautions;Decreased safety awareness;Decreased skin integrity       PT Treatment Interventions Therapeutic activities;Gait training;Therapeutic exercise;Patient/family education;DME instruction;Stair training;Balance training;Functional mobility training;Neuromuscular re-education    PT Goals (Current goals can be found in the Care Plan section)  Acute Rehab PT Goals Patient Stated Goal: back to doing more for self, back to baseline mobility PT Goal Formulation: With patient Time For Goal Achievement: 11/23/23 Potential to Achieve Goals: Good    Frequency Min 3X/week     Co-evaluation               AM-PAC PT 6 Clicks Mobility  Outcome Measure Help needed turning from your back to your side while in a flat bed without using bedrails?: A Little Help needed moving from lying on your back to sitting on the side of a flat bed without using bedrails?: A Lot Help needed moving to and from a bed to a chair (including a  wheelchair)?: A Lot Help needed standing up from a chair using your arms (e.g., wheelchair or bedside chair)?: A Little Help needed to walk in hospital room?: A Lot Help needed climbing 3-5 steps with a railing? : Total 6 Click Score: 13    End of Session   Activity Tolerance: Patient tolerated treatment well Patient left: with call bell/phone within reach;in chair;with chair alarm set Nurse Communication: Mobility status PT Visit Diagnosis: Other abnormalities of gait and mobility (R26.89);Muscle weakness (generalized) (M62.81)    Time: 5784-6962 PT Time Calculation (min) (ACUTE ONLY): 30  min   Charges:   PT Evaluation $PT Eval Low Complexity: 1 Low PT Treatments $Therapeutic Activity: 8-22 mins PT General Charges $$ ACUTE PT VISIT: 1 Visit         Shirlene Doughty, PT DPT Acute Rehabilitation Services Secure Chat Preferred  Office [phone]   Michiah Mudry E Burnadette Carrion 11/09/2023, 10:52 AM

## 2023-11-09 NOTE — Progress Notes (Signed)
 Chief Complaint  Patient presents with   Routine Post Op    Patient presents today status post incision and drainage of the right plantar foot with planning exostectomy.  DOS: 10/16/2023, pt states he thinks foot is doing well, still drainage from wound.    Subjective:  Patient presents today status post incision and drainage of the right plantar foot with planning exostectomy.  DOS: 10/16/2023.  Patient continues to do well.  Dressings are clean dry and intact.  No new complaints.  Denies fever chills nausea vomiting shortness of breath or chest pain  Past Medical History:  Diagnosis Date   Agent orange exposure 1970's   takes Imdur ,Diltiazem , and Atacand  daily   Agent orange exposure    ALLERGIC RHINITIS    Allergy     seasonal allergies   Anemia    iron  deficiency   Arthritis    Asthma    uses inhaler and nebulizers   Atrial fibrillation (HCC)    Bruises easily    d/t prednisone  daily   Cataract    Cellulitis 03/22/2020   Chest wall pain 09/21/2019   L scapula, reported 4/27 21/     CHF (congestive heart failure) (HCC)    dx-on meds to treat   Chronic bronchitis (HCC)    used to get it q yr; last time ?2008 (04/28/2012)   Clotting disorder (HCC) 2019   On blood thinner when I bleed it is for a consideral amount of time. This is tegardless of size of wound.   COPD (chronic obstructive pulmonary disease) (HCC)    agent orange exposure   Degenerative disk disease    everywhere (04/28/2012)   Diverticulitis    Dysrhythmia    afib   Elevated uric acid in blood    takes Allopurinol  daily   Emphysema of lung (HCC)    Enlarged prostate    but not on any meds   GERD (gastroesophageal reflux disease)    takes Protonix  daily   H/O hiatal hernia    Headache    History of colonoscopy    History of pneumonia    a. 2010   Hyperlipidemia    takes Lipitor daily; pt. states he takes a preventive   Hypothyroidism    Joint pain    Joint swelling    Leukocytosis    MSSA  bacteremia 03/19/2023   Nausea and vomiting 11/12/2022   Neuromuscular disorder (HCC)    bilat legs, and bilat arms   Neuropathy    Osteoporosis 2012   PAF (paroxysmal atrial fibrillation) (HCC)    a. Dx 04/2012, CHA2DS2VASc = 1 (age);  b. 04/2012 Echo: EF 40-50%, mild MR.   Personal history of colonic adenomas 02/09/2013   Pneumonia    Sepsis (HCC) 03/18/2023   Shortness of breath dyspnea    Thyroid  disease    no taking meds-caused dizziness    Past Surgical History:  Procedure Laterality Date   81 HOUR PH STUDY N/A 05/30/2021   Procedure: 24 HOUR PH STUDY;  Surgeon: Annis Kinder, DO;  Location: [facility];  Service: Gastroenterology;  Laterality: N/A;   AMPUTATION TOE Bilateral 08/13/2023   Procedure: LEFT SECOND TOE AMPUTATION AND RIGHT FIFTH TOE AMPUTATION;  Surgeon: Evertt Hoe, DPM;  Location: [facility] OR;  Service: Orthopedics/Podiatry;  Laterality: Bilateral;   ANTERIOR CERVICAL DECOMP/DISCECTOMY FUSION N/A 07/21/2012   Procedure: ANTERIOR CERVICAL DECOMPRESSION/DISCECTOMY FUSION 2 LEVELS;  Surgeon: Elna Haggis, MD;  Location: [facility];  Service: Neurosurgery;  Laterality: N/A;  C4-5 C5-6 Anterior cervical decompression/diskectomy/fusion   ANTERIOR LAT LUMBAR FUSION N/A 04/07/2017   Procedure: Thoracic twelve-Lumbar one Anterolateral decompression/fusion;  Surgeon: Elna Haggis, MD;  Location: [facility] OR;  Service: Neurosurgery;  Laterality: N/A;   ARTHRODESIS FOOT WITH WEIL OSTEOTOMY Left 11/07/2022   Procedure: LEFT THIRD METATARSOPHALANGEAL CAPSULOTOMY, WEIL OSTEOTOMY;  Surgeon: Donnamarie Gables, MD;  Location: [facility] OR;  Service: Orthopedics;  Laterality: Left;  LENGTH OF SURGERY: 60 MINUTES   BACK SURGERY     x 3   BONE EXOSTOSIS EXCISION Right 10/16/2023   Procedure: EXCISION, EXOSTOSIS;  Surgeon: Dot Gazella, DPM;  Location: [facility];  Service: Orthopedics/Podiatry;  Laterality: Right;   CARDIAC CATHETERIZATION  11/2008   Dr. Renna Cary - 20% calcified non  flow limiting left main, 50% EF apical hypokinesis   CARPAL TUNNEL RELEASE Bilateral    CERVICAL DISC ARTHROPLASTY N/A 04/07/2017   Procedure: Cervical six-seven Disc arthroplasty;  Surgeon: Elna Haggis, MD;  Location: [facility] OR;  Service: Neurosurgery;  Laterality: N/A;   CHEST TUBE INSERTION Left 04/11/2017   Procedure: CHEST TUBE INSERTION;  Surgeon: Heriberto London, MD;  Location: [facility] OR;  Service: Thoracic;  Laterality: Left;   CHOLECYSTECTOMY     COLONOSCOPY  2016   CG-MAC-miralax (good)servere TICS/TA x 2   ESOPHAGEAL MANOMETRY N/A 05/30/2021   Procedure: ESOPHAGEAL MANOMETRY (EM);  Surgeon: Annis Kinder, DO;  Location: [facility];  Service: Gastroenterology;  Laterality: N/A;  ph impedence   ESOPHAGOGASTRODUODENOSCOPY     EYE SURGERY Bilateral 2011   steroidal encapsulation (04/28/2012)   FOOT SURGERY Right    x 2   JOINT REPLACEMENT     Many surgeries including 5 joit replacements   KNEE ARTHROSCOPY Bilateral    LATERAL / POSTERIOR COMBINED FUSION LUMBAR SPINE  2012   LEFT ATRIAL APPENDAGE OCCLUSION N/A 02/22/2021   Procedure: LEFT ATRIAL APPENDAGE OCCLUSION;  Surgeon: Boyce Byes, MD;  [facility];  Service: Cardiovascular;  Laterality: N/A;   NEUROPLASTY / TRANSPOSITION ULNAR NERVE AT ELBOW Bilateral    POLYPECTOMY  2016   severe TICS/TA x 2   POSTERIOR CERVICAL LAMINECTOMY WITH MET- RX Right 06/11/2021   Procedure: Right Cervical six-seven  Laminectomy and foraminotomy with metrex;  Surgeon: Elna Haggis, MD;  Location: [facility];  Service: Neurosurgery;  Laterality: Right;   REFRACTIVE SURGERY Left 07/07/2023   laser surgery but still has floaters   REVERSE SHOULDER ARTHROPLASTY  04/28/2012   Procedure: REVERSE SHOULDER ARTHROPLASTY;  Surgeon: Derald Flattery, MD;  Location: [facility] OR;  Service: Orthopedics;  Laterality: Left;  Left shouder reverse total shoulder arthroplasty   SHOULDER SURGERY     SPINE SURGERY     I have had 7 spinal  surgeries   TEE WITHOUT CARDIOVERSION N/A 02/22/2021   Procedure: TRANSESOPHAGEAL ECHOCARDIOGRAM (TEE);  Surgeon: Boyce Byes, MD;  [facility] INVASIVE CV LAB;  Service: Cardiovascular;  Laterality: N/A;   TENDON REPAIR Right 08/07/2020   Procedure: RIGHT HAND LIGAMENT RECONSTRUCTION AND TENDON INTERPOSITION;  Surgeon: Neil Balls, MD;  Location: [facility];  Service: Orthopedics;  Laterality: Right;   TONSILLECTOMY AND ADENOIDECTOMY  1954   TOTAL HIP ARTHROPLASTY Left 2011   left (04/28/2012)   TOTAL HIP ARTHROPLASTY Right 07/31/2015   Procedure: TOTAL HIP ARTHROPLASTY ANTERIOR APPROACH;  Surgeon: Neil Balls, MD;  [facility];  Service: Orthopedics;  Laterality: Right;   TOTAL KNEE ARTHROPLASTY  01/10/2012   Procedure: TOTAL KNEE ARTHROPLASTY;  Surgeon: Boston Byers, MD;  Location: [facility];  Service: Orthopedics;;  left total knee arthroplasty   TOTAL KNEE ARTHROPLASTY Right 06/17/2014   Procedure: TOTAL KNEE ARTHROPLASTY;  Surgeon: Boston Byers, MD;  Location: [facility] OR;  Service: Orthopedics;  Laterality: Right;   toupet fundolplication  11/01/2021   TRANSESOPHAGEAL ECHOCARDIOGRAM (CATH LAB) N/A 03/24/2023   Procedure: TRANSESOPHAGEAL ECHOCARDIOGRAM;  Surgeon: Hazle Lites, MD;  [facility]: [facility] CV LAB;  Service: Cardiovascular;  Laterality: N/A;    Allergies  Allergen Reactions   Levaquin  [Levofloxacin ] Itching    Tolerated ciprofloxacin  in 2022.    Objective/Physical Exam Neurovascular status intact.  Continued stability of the foot.  Skin incision is well coapted with staples intact.    Wound to the plantar aspect of the foot appears healthy and viable with granular tissue.  Scant serous drainage noted.  No purulence.  No surrounding erythema.  Radiographic Exam RT foot 10/22/2023:  Unchanged.  Charcot neuroarthropathy  Surgical pathology Order: [id number]  Status: Edited Result - FINAL     Next appt: 10/23/2023 at 08:00 AM in Family  [facility] WELLNESS VISIT)   Test Result Released: Yes (seen)   0 Result Notes    Component Ref Range & Units (hover) 6 d ago  SURGICAL PATHOLOGY SURGICAL PATHOLOGY CASE: [id number] PATIENT: Levi [name] Surgical Pathology Report     Clinical History: Charcot's joint ankle, right, abscess of right foot, right foot ulcer, with fat layer exposed     FINAL MICROSCOPIC DIAGNOSIS:  A. MIDTARSAL BONE, BIOPSY: Reactive cortical and trabecular bone showing acute osteomyelitis   GROSS DESCRIPTION:  Received fresh is a 1.2 x 1.1 x 0.3 cm portion of tan-brown bone.  The specimen is submitted in toto following decalcification.  (GRP 10/17/2023)     Component Ref Range & Units (hover) 6 d ago  Specimen Description BONE RIGHT FOOT Performed at [facility], [address], [place], [place] [address]  Special Requests MIDTARSAL  Gram Stain NO WBC SEEN NO ORGANISMS SEEN  Culture No growth aerobically or anaerobically. Performed at [facility], [address] [place], [place] [address]  Report Status 10/21/2023 FINAL   Component Ref Range & Units (hover) 6 d ago  Specimen Description ABSCESS RIGHT FOOT Performed at [facility], [address], [place], [place] [address]  Special Requests SWABS  Gram Stain NO WBC SEEN NO ORGANISMS SEEN  Culture RARE STAPHYLOCOCCUS COHNII NO ANAEROBES ISOLATED Performed at [facility], [address] [place], [place] [address]  Report Status 10/21/2023 FINAL  Organism ID, Bacteria STAPHYLOCOCCUS COHNII  Resulting Agency [facility]     Susceptibility   Staphylococcus cohnii    MIC    CIPROFLOXACIN  <=0.5 SENSI... Sensitive    CLINDAMYCIN 0.5 SENSITIVE Sensitive    ERYTHROMYCIN >=8 RESISTANT Resistant    GENTAMICIN <=0.5 SENSI... Sensitive    Inducible Clindamycin NEGATIVE Sensitive    OXACILLIN >=4 RESISTANT Resistant    RIFAMPIN <=0.5 SENSI... Sensitive     TETRACYCLINE >=16 RESIST... Resistant    TRIMETH /SULFA  40 SENSITIVE Sensitive    VANCOMYCIN  1 SENSITIVE Sensitive           Susceptibility Comments  Staphylococcus cohnii  RARE STAPHYLOCOCCUS COHNII      Assessment: 1. s/p incision and drainage right foot abscess with exostectomy and bone biopsy right foot. DOS: 10/16/2023   Plan of Care:  -Patient was evaluated.  - Patient has completed the oral ciprofloxacin  prescribed. -Supplies provided for application of Prisma collagen with  overlying Hydrofera Blue to the plantar wound -Return to clinic 2 weeks   Dot Gazella, DPM [facility]  Dr. Dot Gazella, DPM    [address] [place], [place] [address]                Office [phone]  Fax [phone]

## 2023-11-09 NOTE — Progress Notes (Signed)
 PROGRESS NOTE    Levi [name]  ZOX:[id number] DOB: 03/21/1946 DOA: 11/08/2023 PCP: Gean Keels, MD    Chief Complaint  Patient presents with   fall on thinners    Brief Narrative:   Levi [name] is a 78 y.o. male with medical history significant of PAF on Eliquis , HFpEF (EF 40-45% in 02/2023), HTN, HLD, COPD, BPH, GERD, and s/p recent LLE 2nd digit/RLE  5th digit ray amputation on 08/13/2023 p/w presyncopal episode iso sepsis 2/2 RLE cellulitis vs OM.   Pt states that he was in his USOH until 0400 this morning when he fell to the right while washing his hands after urinating. The pt reports waking up as usual, walking to restroom w/o issue, and using the restroom w/o issue before becoming weak and slumping to right. He banged his body against the wall and was in a slouching position when his wife found him and activated EMS. He denies LOC, but did strike his head against the bathroom wall. No obvious laceration or hemorrhage. Pt denies any h/o recurrent falls or syncopal episodes.   In the ED, pt tachycardic, and  tachypneic on RA. Labs notable for K 2.9, Cr 1.05, albumin 2.9, WBC 13.8, and lactate 2.4. CTH NAICA. CT C-spine no acute abnl. CXR no acute cardiopulmonary findings. CR R foot CT showed moderate edema, advanced Charcot foot, and old fifth toe amputation and remote resection of the second through fourth proximal phalangeal heads. Pt received IV vancomycin , IV cefepime , and IV metronidazole  in the ED and was admitted to medicine for sepsis of unknown origin.   MRI of the right foot significant for osteomyelitis.   Assessment & Plan:   Principal Problem:   Right foot infection   Sepsis, present on admission, most likely due to right lower extremity cellulitis, foot osteomyelitis . -Sepsis present on admission, tachycardic, with leukocytosis and elevated lactic acid - S/p recent RLE 5th ray resection - Continue with IV antibiotics, vancomycin , cefepime  and  Flagyl . -Follow-up blood cultures -Podiatry consulted, to evaluate In a.m. -Continue with IV fluids -PTA lyrica  100mg  BID, and percocet 5mg  q4h prn - CRP is elevated, Pro-Cal is elevated,   Syncope -Most likely due to above, continue with telemetry monitoring, continue with IV fluids  PAF -Continue with Eliquis  and diltiazem   Chronic systolic CHF -Is on IV fluids, monitor closely, Lasix  remains on hold    HTN -PTA diltiazem  per above -HOLD pta Imdur  and Lasix    COPD -Brovana /pulmicort  nebs BID + Duonebs prn -Consider combination inhaler such as Breztri or Symbicort at discharge   BPH -PTA tamsulosin      DVT prophylaxis: on Eliquis  Code Status: Full Family Communication: none at bedside Disposition:   Status is: Inpatient    Consultants:  Podiatry   Subjective:  Complains of pain in right lower extremity, reports generalized weakness and fatigue  Objective: Vitals:   11/09/23 0343 11/09/23 0830 11/09/23 1121 11/09/23 1203  BP: (!) 107/57 113/65 110/65 101/60  Pulse: 87 94 (!) 105   Resp: 10 18 (!) 21   Temp: 98.2 F (36.8 C) 98.8 F (37.1 C) 98.2 F (36.8 C) 98.3 F (36.8 C)  TempSrc: Oral Oral Oral Oral  SpO2: 94% 100% 96%   Weight:      Height:        Intake/Output Summary (Last 24 hours) at 11/09/2023 1232 Last data filed at 11/09/2023 1204 Gross per 24 hour  Intake --  Output 1150 ml  Net -1150 ml   Filed  Weights   11/08/23 0518  Weight: 93 kg    Examination:  Awake Alert, Oriented X 3, No new F.N deficits, Normal affect Symmetrical Chest wall movement, Good air movement bilaterally, CTAB RRR,No Gallops,Rubs or new Murmurs, No Parasternal Heave +ve B.Sounds, Abd Soft, No tenderness, No rebound - guarding or rigidity. Right foot with plantar ulcer, please see picture below      Data Reviewed: I have personally reviewed following labs and imaging studies  CBC: Recent Labs  Lab 11/03/23 1113 11/08/23 0511 11/08/23 0514  11/09/23 0753  WBC 7.6  --  13.8* 10.3  NEUTROABS 6.2  --   --   --   HGB 10.9* 11.6* 11.6* 10.1*  HCT 33.3* 34.0* 35.8* 32.0*  MCV 95  --  96.0 96.1  PLT 216  --  172 144*    Basic Metabolic Panel: Recent Labs  Lab 11/03/23 1058 11/08/23 0511 11/08/23 0514 11/09/23 0753  NA 141 137 138 138  K 3.6 2.9* 2.9* 3.9  CL 104 98 100 105  CO2 19*  --  26 23  GLUCOSE 103* 83 88 107*  BUN 17 17 17 18   CREATININE 0.82 1.00 1.05 0.78  CALCIUM  8.8  --  8.5* 8.2*    GFR: Estimated Creatinine Clearance: 91.3 mL/min (by C-G formula based on SCr of 0.78 mg/dL).  Liver Function Tests: Recent Labs  Lab 11/03/23 1058 11/08/23 0514  AST 16 19  ALT 9 13  ALKPHOS 103 66  BILITOT 0.4 1.1  PROT 5.3* 5.6*  ALBUMIN 3.6* 2.9*    CBG: No results for input(s): GLUCAP in the last 168 hours.   Recent Results (from the past 240 hours)  Culture, blood (single) w Reflex to ID Panel     Status: None (Preliminary result)   Collection Time: 11/03/23 10:49 AM   Specimen: Blood   VB  Result Value Ref Range Status   BLOOD CULTURE, ROUTINE Preliminary report  Preliminary   Organism ID, Bacteria Comment  Preliminary    Comment: No growth in 48 hours.  Microscopic Examination     Status: Abnormal   Collection Time: 11/03/23 11:13 AM  Result Value Ref Range Status   WBC, UA 0-5 0 - 5 /hpf Final   RBC, Urine None seen 0 - 2 /hpf Final   Epithelial Cells (non renal) 0-10 0 - 10 /hpf Final   Casts None seen None seen /lpf Final   Crystals Present (A) N/A Final   Crystal Type Calcium  Oxalate N/A Final   Bacteria, UA None seen None seen/Few Final  Urine Culture, Reflex     Status: None   Collection Time: 11/03/23 11:13 AM  Result Value Ref Range Status   Urine Culture, Routine Final report  Final   Organism ID, Bacteria Comment  Final    Comment: Mixed urogenital flora 25,000-50,000 colony forming units per mL   Culture, blood (Routine x 2)     Status: None (Preliminary result)    Collection Time: 11/08/23  5:14 AM   Specimen: BLOOD RIGHT FOREARM  Result Value Ref Range Status   Specimen Description BLOOD RIGHT FOREARM  Final   Special Requests   Final    BOTTLES DRAWN AEROBIC AND ANAEROBIC Blood Culture adequate volume   Culture   Final    NO GROWTH 1 DAY Performed at [facility], [address], [place], [place] [address]    Report Status PENDING  Incomplete  Culture, blood (Routine x 2)  Status: None (Preliminary result)   Collection Time: 11/08/23  5:53 AM   Specimen: BLOOD  Result Value Ref Range Status   Specimen Description BLOOD BLOOD RIGHT ARM  Final   Special Requests   Final    BOTTLES DRAWN AEROBIC AND ANAEROBIC Blood Culture adequate volume   Culture   Final    NO GROWTH 1 DAY Performed at [facility], [address], [place], [place] [address]    Report Status PENDING  Incomplete         Radiology Studies: MR FOOT RIGHT W WO CONTRAST Result Date: 11/08/2023 CLINICAL DATA:  Right foot infection. Clinical concern for acute osteomyelitis. History of Charcot arthropathy and osteomyelitis. EXAM: MRI OF THE RIGHT FOREFOOT WITHOUT AND WITH CONTRAST TECHNIQUE: Multiplanar, multisequence MR imaging of the right foot was performed before and after the administration of intravenous contrast. CONTRAST:  10mL GADAVIST  GADOBUTROL  1 MMOL/ML IV SOLN COMPARISON:  right foot radiographs 11/08/2023 and 10/22/2023, CT right foot 10/08/2023 FINDINGS: Soft tissue/bones/Joint/Cartilage There is new high-grade ulceration within the lateral midfoot, plantar to the cuboid in a region measuring up to approximately 10 mm in transverse dimension and 10 mm in the long axis of the foot (axial series 6, image 33 and sagittal series 9, image 23). There is a sinus tract measuring up to 9 mm in long axis of the foot and 16 mm in transverse dimension (post-contrast axial series 11, images 32 and 33, postcontrast sagittal series 12, image 23; axial series 6, image 33  and coronal series 7 images 7 through 9). This extends superiorly to the distal lateral aspect of the cuboid. Additional scattered low signal with blooming artifact that may represent subcutaneous air and a tiny wound measuring up to approximately 6 mm just lateral to the base of the fifth metatarsal (axial series 4, image 32 and coronal series 7, image 12) suspicious for a sinus tract extending to the base of the fifth metatarsal. There is high-grade marrow edema throughout the plantar and lateral aspects of the cuboid and throughout the entire adjacent proximal 2.9 cm of the fifth metatarsal. Mild to moderate plantar cortical erosion (axial series 4, image 33) of the cuboid and base of the fifth metatarsal. Findings are indicative of osteomyelitis of the lateral plantar cuboid and the base of the fifth metatarsal. Thin linear fluid bright signal at the lateral aspect of the fifth metatarsal proximal metaphysis may represent an acute nondisplaced fracture (coronal series 7, image 12), possibly pathologic from weakened bone underlying osteomyelitis. There is again postsurgical change of amputation of the fifth toe phalanges. There is metallic susceptibility artifact in between the fifth and fourth metatarsal heads, corresponding to a focal density on prior CT and radiographs that may represent postsurgical metal. Postsurgical changes again noted of prior resection of the second through fourth proximal phalangeal heads, better seen on prior CT. There are again advanced Charcot arthropathy changes of the midfoot with second through fifth metatarsal mild homolateral subluxation. There is again plantar migration of the midfoot, especially the cuboid, with rocker bottom deformity. Marrow edema within the distal aspect of the lateral cuneiform with high-grade marrow edema within the proximal 15 mm of the fourth metatarsal (coronal series 7 images 13 through 15) are suspicious for osteomyelitis on both sides of the fourth  tarsometatarsal joint (also coronal series 7, image 17 and sagittal series 9, image 18). Moderate marrow edema at the first and second tarsometatarsal joints is nonspecific and may represent Charcot arthropathy degenerative changes although  acute osteomyelitis is difficult to entirely exclude. Ligaments Limited evaluation given large field-of-view and decreased resolution. There appears to be disruption of the Lisfranc ligament complex, consistent with the lateral subluxation of the second through fifth metatarsals. Muscles and Tendons Moderate focal fluid within the flexor hallucis longus tendon sheath within the plantar midfoot just proximal to the intersection with the flexor digitorum longus tendon at the knot of Henry (axial series 6, image 28). Moderate edema throughout the plantar midfoot to forefoot intrinsic musculature. Soft tissues Moderate diffuse subcutaneous fat edema and swelling about the ankle and midfoot. IMPRESSION: 1. New high-grade ulceration within the lateral midfoot, plantar to the cuboid, with a sinus tract extending superiorly to the distal lateral aspect of the cuboid. Additional tiny wound just lateral to the base of the fifth metatarsal with a sinus tract extending to the base of the fifth metatarsal. 2. Osteomyelitis of the plantar lateral cuboid and base of the fifth metatarsal. Additional marrow edema within the distal aspect of the lateral cuneiform and proximal 15 mm of the fourth metatarsal are suspicious for osteomyelitis on both sides of the fourth tarsometatarsal joint. 3. Thin linear fluid bright signal at the lateral aspect of the fifth metatarsal proximal metaphysis may represent an acute nondisplaced fracture, possibly pathologic from the weakened bone from the underlying osteomyelitis. 4. Advanced Charcot arthropathy changes of the midfoot with second through fifth metatarsal homolateral subluxation and plantar migration of the midfoot, especially the cuboid, with  rocker-bottom deformity. 5. Moderate marrow edema at the first and second tarsometatarsal joints is nonspecific and may represent Charcot arthropathy degenerative changes, although acute osteomyelitis is difficult to entirely exclude. Electronically Signed   By: Bertina Broccoli M.D.   On: 11/08/2023 17:04   DG Foot Complete Right Result Date: 11/08/2023 CLINICAL DATA:  A right foot infection. EXAM: RIGHT FOOT COMPLETE - 3+ VIEW COMPARISON:  Right foot series 10/22/2023 FINDINGS: Three views. There is diffuse moderate edema. Skin staples previously seen overlying the lateral midfoot have been removed in the interval and resolution of patchy soft tissue gas in the area is noted. There are heavy vascular calcifications. Osteopenia without evidence of fractures with old fifth toe amputation. Remote resection of the second through fourth proximal phalangeal heads. There is advanced Charcot change especially at the Lisfranc and tarsometatarsal joints, lateral subluxations as seen previously, pes planus and midfoot sag with chronic rocker bottom deformity. There is a small plantar calcaneal spur. No destructive bone lesions are seen. IMPRESSION: 1. Diffuse moderate edema. 2. Interval removal of skin staples overlying the lateral midfoot with resolution of patchy soft tissue gas in the area. 3. Advanced Charcot change with lateral metatarsal subluxations, pes planus and midfoot sag with chronic rocker bottom deformity. 4. Osteopenia. No evidence of fractures or focal destructive lesions. 5. Heavy vascular calcifications. 6. Old fifth toe amputation and remote resection of the second through fourth proximal phalangeal heads. Electronically Signed   By: Denman Fischer M.D.   On: 11/08/2023 06:36   CT CERVICAL SPINE WO CONTRAST Result Date: 11/08/2023 EXAM: CT CERVICAL SPINE WITHOUT CONTRAST 11/08/2023 05:17:53 AM TECHNIQUE: CT of the cervical was performed without the administration of intravenous contrast. Multiplanar  reformatted images are provided for review. Automated exposure control, iterative reconstruction, and/or weight based adjustment of the mA/kV was utilized to reduce the radiation dose to as low as reasonably achievable. COMPARISON: None available. CLINICAL HISTORY: Polytrauma, blunt. Chief complaints; Level 2 FOT; CT HEAD WO CONTRAST; Head trauma, moderate-severe; CT CERVICAL SPINE WO CONTRAST;  Polytrauma, blunt FINDINGS: CERVICAL SPINE: BONES AND ALIGNMENT: Solid fusion of the cervical spine is present at C4-5, C5-6, and C6-7. Hardware is intact. Straightening of the normal cervical lordosis is present. DEGENERATIVE CHANGES: No significant degenerative changes. SOFT TISSUES: No prevertebral soft tissue swelling. Mild atherosclerotic changes are present at the carotid bifurcations bilaterally without definite stenosis. IMPRESSION: 1. No acute abnormality of the cervical spine related to the polytrauma. 2. Solid fusion of the cervical spine at C4-5, C5-6, and C6-7 with intact hardware. Electronically signed by: Audree Leas MD 11/08/2023 05:29 AM EDT RP Workstation: [id number]   DG Chest Port 1 View Result Date: 11/08/2023 CLINICAL DATA:  Fall. EXAM: PORTABLE CHEST 1 VIEW COMPARISON:  None Available. FINDINGS: Low volume film. The cardio pericardial silhouette is enlarged. The lungs are clear without focal pneumonia, edema, pneumothorax or pleural effusion. Multiple nonacute rib fractures evident bilaterally. Status post left shoulder replacement. Telemetry leads overlie the chest. IMPRESSION: Low volume film without acute cardiopulmonary findings. Electronically Signed   By: Donnal Fusi M.D.   On: 11/08/2023 05:27   CT HEAD WO CONTRAST Result Date: 11/08/2023 EXAM: CT HEAD WITHOUT CONTRAST 11/08/2023 05:17:53 AM TECHNIQUE: CT of the head was performed without the administration of intravenous contrast. Automated exposure control, iterative reconstruction, and/or weight based adjustment of the mA/kV  was utilized to reduce the radiation dose to as low as reasonably achievable. COMPARISON: CT head without contrast from 09/13/2020. MR head without and with contrast from 05/21/2022. CLINICAL HISTORY: Head trauma, moderate-severe. Chief complaints; Level 2 FOT; Head trauma, moderate-severe; Polytrauma, blunt. FINDINGS: BRAIN AND VENTRICLES: There is no acute intracranial hemorrhage, mass effect or midline shift. No abnormal extra-axial fluid collection. The gray-white differentiation is maintained without an acute infarct. There is no hydrocephalus. Periventricular white matter hypoattenuation, mildly advanced for age, is stable. ORBITS: Bilateral lens replacements are noted. The globes and orbits are otherwise within normal limits. SINUSES: The visualized paranasal sinuses and mastoid air cells demonstrate no acute abnormality. SOFT TISSUES AND SKULL: No acute abnormality of the visualized skull or soft tissues. Atherosclerotic calcifications are present in the cavernous carotid arteries bilaterally. No hyperdense vessel is present. IMPRESSION: 1. No acute intracranial abnormality related to the head trauma. 2. Stable periventricular white matter hypoattenuation, mildly advanced for age. Electronically signed by: Audree Leas MD 11/08/2023 05:25 AM EDT RP Workstation: [id number]        Scheduled Meds:  apixaban   5 mg Oral BID   arformoterol   15 mcg Nebulization BID   atorvastatin   20 mg Oral Daily   budesonide  (PULMICORT ) nebulizer solution  0.25 mg Nebulization BID   diltiazem   180 mg Oral Daily   pantoprazole   40 mg Oral BID   pregabalin   100 mg Oral BID   tamsulosin   0.4 mg Oral QHS   Continuous Infusions:  ceFEPime  (MAXIPIME ) IV 2 g (11/09/23 0547)   vancomycin  1,250 mg (11/09/23 1058)     LOS: 1 day     Seena Dadds, MD [facility]   To contact the attending provider between 7A-7P or the covering provider during after hours 7P-7A, please log into the web site  [email or web address] and access using universal [place] password for that web site. If you do not have the password, please call the hospital operator.  11/09/2023, 12:32 PM

## 2023-11-09 NOTE — Progress Notes (Signed)
 Pharmacy Antibiotic Note  Levi [name] is a 78 y.o. male admitted on 11/08/2023 with wound infection.  Pharmacy has been consulted for vancomycin  and cefepime  dosing. On ciprofloxacin  prior to admission for left wound infection - prescribed by [facility].  Leukocytosis resolved and continues to be afebrile. Renal function appears to have improved closer to baseline. Will adjust vancomycin  dose accordingly.  Plan: Vancomycin  1250mg  IV every 12 hours (eAUC 465, Scr <0.8) Cefepime  2g IV Q8H F/u cultures and clinical progression  Height: 6' 2 (188 cm) Weight: 93 kg (205 lb) IBW/kg (Calculated) : 82.2  Temp (24hrs), Avg:98.4 F (36.9 C), Min:98 F (36.7 C), Max:98.8 F (37.1 C)  Recent Labs  Lab 11/03/23 1058 11/03/23 1113 11/08/23 0511 11/08/23 0512 11/08/23 0514 11/08/23 1259 11/09/23 0753  WBC  --  7.6  --   --  13.8*  --  10.3  CREATININE 0.82  --  1.00  --  1.05  --  0.78  LATICACIDVEN  --   --   --  2.4*  --  2.4*  --     Estimated Creatinine Clearance: 91.3 mL/min (by C-G formula based on SCr of 0.78 mg/dL).    Allergies  Allergen Reactions   Levaquin  [Levofloxacin ] Itching    Tolerated ciprofloxacin  in 2022.    Antimicrobials this admission: Vancomycin  6/14 >>  Cefepime  6/14 >> Metronidazole  6/14 x1  Microbiology results: 6/14 BCx: NGTD x1  Thank you for allowing pharmacy to be a part of this patient's care.  Marybelle Smiling 11/09/2023 9:32 AM

## 2023-11-10 ENCOUNTER — Inpatient Hospital Stay (HOSPITAL_COMMUNITY)

## 2023-11-10 DIAGNOSIS — G629 Polyneuropathy, unspecified: Secondary | ICD-10-CM

## 2023-11-10 DIAGNOSIS — M86471 Chronic osteomyelitis with draining sinus, right ankle and foot: Secondary | ICD-10-CM

## 2023-11-10 DIAGNOSIS — M86171 Other acute osteomyelitis, right ankle and foot: Secondary | ICD-10-CM | POA: Diagnosis not present

## 2023-11-10 DIAGNOSIS — M216X1 Other acquired deformities of right foot: Secondary | ICD-10-CM | POA: Diagnosis not present

## 2023-11-10 DIAGNOSIS — L089 Local infection of the skin and subcutaneous tissue, unspecified: Secondary | ICD-10-CM | POA: Diagnosis not present

## 2023-11-10 DIAGNOSIS — L03115 Cellulitis of right lower limb: Secondary | ICD-10-CM | POA: Diagnosis not present

## 2023-11-10 LAB — CBC
HCT: 30 % — ABNORMAL LOW (ref 39.0–52.0)
Hemoglobin: 9.5 g/dL — ABNORMAL LOW (ref 13.0–17.0)
MCH: 30.5 pg (ref 26.0–34.0)
MCHC: 31.7 g/dL (ref 30.0–36.0)
MCV: 96.5 fL (ref 80.0–100.0)
Platelets: 129 10*3/uL — ABNORMAL LOW (ref 150–400)
RBC: 3.11 MIL/uL — ABNORMAL LOW (ref 4.22–5.81)
RDW: 15 % (ref 11.5–15.5)
WBC: 7.6 10*3/uL (ref 4.0–10.5)
nRBC: 0 % (ref 0.0–0.2)

## 2023-11-10 LAB — BASIC METABOLIC PANEL WITH GFR
Anion gap: 6 (ref 5–15)
BUN: 15 mg/dL (ref 8–23)
CO2: 22 mmol/L (ref 22–32)
Calcium: 7.5 mg/dL — ABNORMAL LOW (ref 8.9–10.3)
Chloride: 105 mmol/L (ref 98–111)
Creatinine, Ser: 0.81 mg/dL (ref 0.61–1.24)
GFR, Estimated: >60 mL/min (ref >60)
Glucose, Bld: 105 mg/dL — ABNORMAL HIGH (ref 70–99)
Potassium: 3.4 mmol/L — ABNORMAL LOW (ref 3.5–5.1)
Sodium: 133 mmol/L — ABNORMAL LOW (ref 135–145)

## 2023-11-10 LAB — C-REACTIVE PROTEIN: CRP: 21 mg/dL — ABNORMAL HIGH (ref <1.0)

## 2023-11-10 LAB — PHOSPHORUS: Phosphorus: 2.2 mg/dL — ABNORMAL LOW (ref 2.5–4.6)

## 2023-11-10 LAB — MAGNESIUM: Magnesium: 1.7 mg/dL (ref 1.7–2.4)

## 2023-11-10 LAB — PROCALCITONIN: Procalcitonin: 0.87 ng/mL

## 2023-11-10 MED ORDER — CYANOCOBALAMIN 1000 MCG/ML IJ SOLN: 1000.0000 ug | INTRAMUSCULAR | Status: DC

## 2023-11-10 MED ORDER — BUTALBITAL-APAP-CAFFEINE 50-325-40 MG PO TABS
1.0000 | ORAL_TABLET | Freq: Once | ORAL | Status: AC
  Administered 2023-11-10: 1 via ORAL
  Filled 2023-11-10: qty 1

## 2023-11-10 MED ORDER — CYANOCOBALAMIN 1000 MCG/ML IJ SOLN
1000.0000 ug | Freq: Every day | INTRAMUSCULAR | Status: AC
  Administered 2023-11-10 – 2023-11-16 (×7): 1000 ug via SUBCUTANEOUS
  Filled 2023-11-10 (×7): qty 1

## 2023-11-10 MED ORDER — IBUPROFEN 400 MG PO TABS
400.0000 mg | ORAL_TABLET | Freq: Once | ORAL | Status: AC
  Administered 2023-11-10: 400 mg via ORAL
  Filled 2023-11-10: qty 1

## 2023-11-10 MED ORDER — POTASSIUM CHLORIDE CRYS ER 20 MEQ PO TBCR
40.0000 meq | EXTENDED_RELEASE_TABLET | Freq: Four times a day (QID) | ORAL | Status: AC
  Administered 2023-11-10 (×2): 40 meq via ORAL
  Filled 2023-11-10 (×2): qty 2

## 2023-11-10 MED ORDER — IOHEXOL 350 MG/ML SOLN
75.0000 mL | Freq: Once | INTRAVENOUS | Status: AC | PRN
  Administered 2023-11-10: 75 mL via INTRAVENOUS

## 2023-11-10 MED ORDER — DICLOFENAC SODIUM 1 % EX GEL
4.0000 g | Freq: Four times a day (QID) | CUTANEOUS | Status: DC
  Administered 2023-11-10 – 2023-11-13 (×12): 4 g via TOPICAL
  Filled 2023-11-10: qty 100

## 2023-11-10 NOTE — Evaluation (Signed)
 Occupational Therapy Evaluation Patient Details Name: Levi [name] MRN: [id number] DOB: 1945-07-16 Today's Date: 11/10/2023   History of Present Illness   78 yo male presents to [facility] on 6/14 with mechanical fall + head trauma. Workup for septic shock due to RLE cellulitis and possible osteomyelitis. CTH and CT Cspine negative for acute findings.  Podiatry consult 6/16 with possible BKA for later this week. PMHx:  admission 03/2023 for RLE wound and cellulitis, agent orange exposure, afib, neuropathy, osteoporosis, CHF, COPD, HLD and venous insufficiency of both lower extremities, PAF, L 2nd and 5th toe amputations 07/2023, recent RLE 5th ray resection, ACDF 2014 and 2023, lumbar fusion 2018, x5 joint replacements.     Clinical Impressions PTA patient reports using rollator for mobility, independent for bathing but needing assist from spouse for LB Adls and IADLs.  Admitted for above and presents with problem list below. He requires mod assist for bed mobility, mod assist to stand from elevated EOB and min assist to step towards [facility]; setup to total assist for ADLs.  Pt has good support at home and anticipate he will progress well with intensive rehab program.  Based on performance today, believe pt will best benefit from continued OT services acutely and after dc at an inpatient setting with >3hrs/day to optimize independence, safety and return to PLOF with ADLs and mobility.      If plan is discharge home, recommend the following:   Two people to help with walking and/or transfers;A lot of help with bathing/dressing/bathroom;Assistance with cooking/housework;Help with stairs or ramp for entrance;Assist for transportation     Functional Status Assessment   Patient has had a recent decline in their functional status and demonstrates the ability to make significant improvements in function in a reasonable and predictable amount of time.     Equipment Recommendations   Other (comment)  (defer)     Recommendations for Other Services   Rehab consult     Precautions/Restrictions   Precautions Precautions: Fall Recall of Precautions/Restrictions: Intact Required Braces or Orthoses: Other Brace Other Brace: in post op shoe Restrictions Weight Bearing Restrictions Per Provider Order: Yes RLE Weight Bearing Per Provider Order: Weight bearing as tolerated Other Position/Activity Restrictions: post op shoe     Mobility Bed Mobility Overal bed mobility: Needs Assistance Bed Mobility: Supine to Sit, Sit to Supine     Supine to sit: Mod assist, HOB elevated Sit to supine: Mod assist   General bed mobility comments: assist for trunk rise, scooting towards EOB; BLEs back to supine    Transfers Overall transfer level: Needs assistance Equipment used: Rolling walker (2 wheels) Transfers: Sit to/from Stand Sit to Stand: Mod assist, From elevated surface           General transfer comment: posterior bias, visual demonstration for technique and requires mod assist to fully power up for elevated EOB. steps towards [facility] with min assist      Balance Overall balance assessment: Needs assistance, History of Falls Sitting-balance support: No upper extremity supported, Feet supported Sitting balance-[name] Scale: Fair     Standing balance support: During functional activity, No upper extremity supported, Bilateral upper extremity supported Standing balance-[name] Scale: Poor                             ADL either performed or assessed with clinical judgement   ADL Overall ADL's : Needs assistance/impaired     Grooming: Set up;Sitting  Upper Body Dressing : Minimal assistance;Sitting   Lower Body Dressing: Total assistance;Sit to/from stand Lower Body Dressing Details (indicate cue type and reason): assist for shoes, relies on BUE support in standing             Functional mobility during ADLs: Moderate assistance;Rolling walker  (2 wheels);Cueing for sequencing;Cueing for safety       Vision   Vision Assessment?: No apparent visual deficits     Perception         Praxis         Pertinent Vitals/Pain Pain Assessment Pain Assessment: Faces Faces Pain Scale: Hurts a little bit Pain Location: R feel/forefoot, L heel Pain Descriptors / Indicators: Grimacing, Guarding, Discomfort, Sore Pain Intervention(s): Limited activity within patient's tolerance, Monitored during session, Repositioned     Extremity/Trunk Assessment Upper Extremity Assessment Upper Extremity Assessment: Generalized weakness;RUE deficits/detail RUE Deficits / Details: limited R shoulder flexion , reports hx of chronic shoulder issues RUE Sensation: decreased light touch RUE Coordination: decreased gross motor   Lower Extremity Assessment Lower Extremity Assessment: Defer to PT evaluation   Cervical / Trunk Assessment Cervical / Trunk Assessment: Back Surgery;Neck Surgery   Communication Communication Communication: No apparent difficulties   Cognition Arousal: Alert Behavior During Therapy: WFL for tasks assessed/performed Cognition: No apparent impairments             OT - Cognition Comments: appears WFL                 Following commands: Intact       Cueing  General Comments   Cueing Techniques: Verbal cues;Visual cues  SpO2 88-94% on RA. HR low 100s   Exercises     Shoulder Instructions      Home Living Family/patient expects to be discharged to:: Private residence Living Arrangements: Spouse/significant other Available Help at Discharge: Family;Available 24 hours/day Type of Home: House Home Access: Stairs to enter;Level entry;Other (comment) (has been living in the basement so has a level entry, has been there for x1 year) Entrance Stairs-Number of Steps: 3   Home Layout: Able to live on main level with bedroom/bathroom     Bathroom Shower/Tub: Producer, television/film/video:  Handicapped height     Home Equipment: Control and instrumentation engineer (2 wheels);Cane - single point;Rollator (4 wheels)          Prior Functioning/Environment Prior Level of Function : Needs assist;History of Falls (last six months);Driving             Mobility Comments: using rollator for gait, walks some community ditances ADLs Comments: wife assisting with LB dressing, indep with showering. Wife cooks and cleans, also has a cleaner come in 2x/month    OT Problem List: Decreased strength;Decreased activity tolerance;Impaired balance (sitting and/or standing);Pain;Impaired UE functional use;Increased edema;Obesity;Decreased knowledge of precautions;Decreased knowledge of use of DME or AE;Decreased range of motion;Decreased coordination   OT Treatment/Interventions: Self-care/ADL training;Therapeutic exercise;DME and/or AE instruction;Therapeutic activities;Cognitive remediation/compensation;Patient/family education;Balance training;Energy conservation      OT Goals(Current goals can be found in the care plan section)   Acute Rehab OT Goals Patient Stated Goal: get better OT Goal Formulation: With patient Time For Goal Achievement: 11/24/23 Potential to Achieve Goals: Good   OT Frequency:  Min 2X/week    Co-evaluation              AM-PAC OT 6 Clicks Daily Activity     Outcome Measure Help from another person eating meals?: None Help from another person taking  care of personal grooming?: A Little Help from another person toileting, which includes using toliet, bedpan, or urinal?: A Lot Help from another person bathing (including washing, rinsing, drying)?: A Lot Help from another person to put on and taking off regular upper body clothing?: A Little Help from another person to put on and taking off regular lower body clothing?: Total 6 Click Score: 15   End of Session Equipment Utilized During Treatment: Gait belt;Rolling walker (2 wheels) Nurse  Communication: Mobility status  Activity Tolerance: Patient tolerated treatment well Patient left: with call bell/phone within reach;in bed;with bed alarm set  OT Visit Diagnosis: Other abnormalities of gait and mobility (R26.89);Muscle weakness (generalized) (M62.81);Pain;History of falling (Z91.81) Pain - Right/Left: Right Pain - part of body: Ankle and joints of foot                Time: 7829-5621 OT Time Calculation (min): 34 min Charges:  OT General Charges $OT Visit: 1 Visit OT Evaluation $OT Eval Moderate Complexity: 1 Mod OT Treatments $Self Care/Home Management : 8-22 mins  Bary Boss, OT Acute Rehabilitation Services Office [phone] Secure Chat Preferred    Fredrich Jefferson 11/10/2023, 12:51 PM

## 2023-11-10 NOTE — Progress Notes (Signed)
 PROGRESS NOTE    Levi [name]  ZOX:[id number] DOB: [birth year] DOA: 11/08/2023 PCP: Gean Keels, MD    Chief Complaint  Patient presents with   fall on thinners    Brief Narrative:   Levi [name] is a 78 y.o. male with medical history significant of PAF on Eliquis , HFpEF (EF 40-45% in 02/2023), HTN, HLD, COPD, BPH, GERD, and s/p recent LLE 2nd digit/RLE  5th digit ray amputation on 08/13/2023 p/w presyncopal episode iso sepsis 2/2 RLE cellulitis vs OM.   Pt states that he was in his USOH until 0400 this morning when he fell to the right while washing his hands after urinating. The pt reports waking up as usual, walking to restroom w/o issue, and using the restroom w/o issue before becoming weak and slumping to right. He banged his body against the wall and was in a slouching position when his wife found him and activated EMS. He denies LOC, but did strike his head against the bathroom wall. No obvious laceration or hemorrhage. Pt denies any h/o recurrent falls or syncopal episodes.   In the ED, pt tachycardic, and  tachypneic on RA. Labs notable for K 2.9, Cr 1.05, albumin 2.9, WBC 13.8, and lactate 2.4. CTH NAICA. CT C-spine no acute abnl. CXR no acute cardiopulmonary findings. CR R foot CT showed moderate edema, advanced Charcot foot, and old fifth toe amputation and remote resection of the second through fourth proximal phalangeal heads. Pt received IV vancomycin , IV cefepime , and IV metronidazole  in the ED and was admitted to medicine for sepsis of unknown origin.   MRI of the right foot significant for osteomyelitis.   Assessment & Plan:   Principal Problem:   Right foot infection   Sepsis, present on admission, most likely due to right lower extremity cellulitis, foot osteomyelitis . -Sepsis present on admission, tachycardic, with leukocytosis and elevated lactic acid - S/p recent RLE 5th ray resection - Continue with IV antibiotics, vancomycin , cefepime  and  Flagyl . -Follow-up blood cultures -Podiatry consulted, to evaluate In a.m. -Continue with IV fluids -PTA lyrica  100mg  BID, and percocet 5mg  q4h prn - CRP is elevated, Pro-Cal is elevated, -Podiatry consulted, discussed with family about possible need of BKA versus IV antibiotics, ID to see the patient as well regarding further recommendations.   Syncope -Most likely due to above, continue with telemetry monitoring, continue with IV fluids  PAF -Continue with Eliquis  and diltiazem   Chronic systolic CHF -Is on IV fluids, monitor closely, Lasix  remains on hold    HTN -PTA diltiazem  per above -HOLD pta Imdur  and Lasix    COPD -Brovana /pulmicort  nebs BID + Duonebs prn -Consider combination inhaler such as Breztri or Symbicort at discharge   BPH -PTA tamsulosin      DVT prophylaxis: on Eliquis  Code Status: Full Family Communication: Wife at bedside Disposition:   Status is: Inpatient    Consultants:  Podiatry   Subjective:  Complaining of tension headache and left neck spasms Objective: Vitals:   11/09/23 2020 11/10/23 0000 11/10/23 0400 11/10/23 0808  BP: 120/73 139/67 114/70 107/76  Pulse: 94 82 92 (!) 102  Resp: 19 18 16 14   Temp: 98.3 F (36.8 C) 98.4 F (36.9 C) 98.1 F (36.7 C) 98.5 F (36.9 C)  TempSrc: Oral Oral Oral Oral  SpO2: 94% 94% 95% 96%  Weight:      Height:        Intake/Output Summary (Last 24 hours) at 11/10/2023 1325 Last data filed at 11/10/2023 0515 Gross per  24 hour  Intake 240 ml  Output 900 ml  Net -660 ml   Filed Weights   11/08/23 0518  Weight: 93 kg    Examination:  Awake Alert, Oriented X 3, No new F.N deficits, Normal affect Symmetrical Chest wall movement, Good air movement bilaterally, CTAB RRR,No Gallops,Rubs or new Murmurs, No Parasternal Heave +ve B.Sounds, Abd Soft, No tenderness, No rebound - guarding or rigidity. Right foot with plantar ulcer, please see picture below  pic on 6/15      Data Reviewed:  I have personally reviewed following labs and imaging studies  CBC: Recent Labs  Lab 11/08/23 0511 11/08/23 0514 11/09/23 0753 11/10/23 0551  WBC  --  13.8* 10.3 7.6  HGB 11.6* 11.6* 10.1* 9.5*  HCT 34.0* 35.8* 32.0* 30.0*  MCV  --  96.0 96.1 96.5  PLT  --  172 144* 129*    Basic Metabolic Panel: Recent Labs  Lab 11/08/23 0511 11/08/23 0514 11/09/23 0753 11/10/23 0551  NA 137 138 138 133*  K 2.9* 2.9* 3.9 3.4*  CL 98 100 105 105  CO2  --  26 23 22   GLUCOSE 83 88 107* 105*  BUN 17 17 18 15   CREATININE 1.00 1.05 0.78 0.81  CALCIUM   --  8.5* 8.2* 7.5*  MG  --   --   --  1.7  PHOS  --   --   --  2.2*    GFR: Estimated Creatinine Clearance: 90.2 mL/min (by C-G formula based on SCr of 0.81 mg/dL).  Liver Function Tests: Recent Labs  Lab 11/08/23 0514  AST 19  ALT 13  ALKPHOS 66  BILITOT 1.1  PROT 5.6*  ALBUMIN 2.9*    CBG: No results for input(s): GLUCAP in the last 168 hours.   Recent Results (from the past 240 hours)  Culture, blood (single) w Reflex to ID Panel     Status: None   Collection Time: 11/03/23 10:49 AM   Specimen: Blood   VB  Result Value Ref Range Status   BLOOD CULTURE, ROUTINE Final report  Final   Organism ID, Bacteria Comment  Final    Comment: No aerobic or anaerobic growth in five days.  Microscopic Examination     Status: Abnormal   Collection Time: 11/03/23 11:13 AM  Result Value Ref Range Status   WBC, UA 0-5 0 - 5 /hpf Final   RBC, Urine None seen 0 - 2 /hpf Final   Epithelial Cells (non renal) 0-10 0 - 10 /hpf Final   Casts None seen None seen /lpf Final   Crystals Present (A) N/A Final   Crystal Type Calcium  Oxalate N/A Final   Bacteria, UA None seen None seen/Few Final  Urine Culture, Reflex     Status: None   Collection Time: 11/03/23 11:13 AM  Result Value Ref Range Status   Urine Culture, Routine Final report  Final   Organism ID, Bacteria Comment  Final    Comment: Mixed urogenital flora 25,000-50,000 colony  forming units per mL   Culture, blood (Routine x 2)     Status: None (Preliminary result)   Collection Time: 11/08/23  5:14 AM   Specimen: BLOOD RIGHT FOREARM  Result Value Ref Range Status   Specimen Description BLOOD RIGHT FOREARM  Final   Special Requests   Final    BOTTLES DRAWN AEROBIC AND ANAEROBIC Blood Culture adequate volume   Culture   Final    NO GROWTH 2 DAYS Performed at [facility], [address], [place], [place] [address]    Report Status PENDING  Incomplete  Culture, blood (Routine x 2)     Status: None (Preliminary result)   Collection Time: 11/08/23  5:53 AM   Specimen: BLOOD  Result Value Ref Range Status   Specimen Description BLOOD BLOOD RIGHT ARM  Final   Special Requests   Final    BOTTLES DRAWN AEROBIC AND ANAEROBIC Blood Culture adequate volume   Culture   Final    NO GROWTH 2 DAYS Performed at [facility], [address], [place], [place] [address]    Report Status PENDING  Incomplete         Radiology Studies: MR FOOT RIGHT W WO CONTRAST Result Date: 11/08/2023 CLINICAL DATA:  Right foot infection. Clinical concern for acute osteomyelitis. History of Charcot arthropathy and osteomyelitis. EXAM: MRI OF THE RIGHT FOREFOOT WITHOUT AND WITH CONTRAST TECHNIQUE: Multiplanar, multisequence MR imaging of the right foot was performed before and after the administration of intravenous contrast. CONTRAST:  10mL GADAVIST  GADOBUTROL  1 MMOL/ML IV SOLN COMPARISON:  right foot radiographs 11/08/2023 and 10/22/2023, CT right foot 10/08/2023 FINDINGS: Soft tissue/bones/Joint/Cartilage There is new high-grade ulceration within the lateral midfoot, plantar to the cuboid in a region measuring up to approximately 10 mm in transverse dimension and 10 mm in the long axis of the foot (axial series 6, image 33 and sagittal series 9, image 23). There is a sinus tract measuring up to 9 mm in long axis of the foot and 16 mm in transverse dimension (post-contrast axial  series 11, images 32 and 33, postcontrast sagittal series 12, image 23; axial series 6, image 33 and coronal series 7 images 7 through 9). This extends superiorly to the distal lateral aspect of the cuboid. Additional scattered low signal with blooming artifact that may represent subcutaneous air and a tiny wound measuring up to approximately 6 mm just lateral to the base of the fifth metatarsal (axial series 4, image 32 and coronal series 7, image 12) suspicious for a sinus tract extending to the base of the fifth metatarsal. There is high-grade marrow edema throughout the plantar and lateral aspects of the cuboid and throughout the entire adjacent proximal 2.9 cm of the fifth metatarsal. Mild to moderate plantar cortical erosion (axial series 4, image 33) of the cuboid and base of the fifth metatarsal. Findings are indicative of osteomyelitis of the lateral plantar cuboid and the base of the fifth metatarsal. Thin linear fluid bright signal at the lateral aspect of the fifth metatarsal proximal metaphysis may represent an acute nondisplaced fracture (coronal series 7, image 12), possibly pathologic from weakened bone underlying osteomyelitis. There is again postsurgical change of amputation of the fifth toe phalanges. There is metallic susceptibility artifact in between the fifth and fourth metatarsal heads, corresponding to a focal density on prior CT and radiographs that may represent postsurgical metal. Postsurgical changes again noted of prior resection of the second through fourth proximal phalangeal heads, better seen on prior CT. There are again advanced Charcot arthropathy changes of the midfoot with second through fifth metatarsal mild homolateral subluxation. There is again plantar migration of the midfoot, especially the cuboid, with rocker bottom deformity. Marrow edema within the distal aspect of the lateral cuneiform with high-grade marrow edema within the proximal 15 mm of the fourth metatarsal  (coronal series 7 images 13 through 15) are suspicious for osteomyelitis on both sides of the fourth tarsometatarsal joint (also coronal series 7, image  17 and sagittal series 9, image 18). Moderate marrow edema at the first and second tarsometatarsal joints is nonspecific and may represent Charcot arthropathy degenerative changes although acute osteomyelitis is difficult to entirely exclude. Ligaments Limited evaluation given large field-of-view and decreased resolution. There appears to be disruption of the Lisfranc ligament complex, consistent with the lateral subluxation of the second through fifth metatarsals. Muscles and Tendons Moderate focal fluid within the flexor hallucis longus tendon sheath within the plantar midfoot just proximal to the intersection with the flexor digitorum longus tendon at the knot of Henry (axial series 6, image 28). Moderate edema throughout the plantar midfoot to forefoot intrinsic musculature. Soft tissues Moderate diffuse subcutaneous fat edema and swelling about the ankle and midfoot. IMPRESSION: 1. New high-grade ulceration within the lateral midfoot, plantar to the cuboid, with a sinus tract extending superiorly to the distal lateral aspect of the cuboid. Additional tiny wound just lateral to the base of the fifth metatarsal with a sinus tract extending to the base of the fifth metatarsal. 2. Osteomyelitis of the plantar lateral cuboid and base of the fifth metatarsal. Additional marrow edema within the distal aspect of the lateral cuneiform and proximal 15 mm of the fourth metatarsal are suspicious for osteomyelitis on both sides of the fourth tarsometatarsal joint. 3. Thin linear fluid bright signal at the lateral aspect of the fifth metatarsal proximal metaphysis may represent an acute nondisplaced fracture, possibly pathologic from the weakened bone from the underlying osteomyelitis. 4. Advanced Charcot arthropathy changes of the midfoot with second through fifth  metatarsal homolateral subluxation and plantar migration of the midfoot, especially the cuboid, with rocker-bottom deformity. 5. Moderate marrow edema at the first and second tarsometatarsal joints is nonspecific and may represent Charcot arthropathy degenerative changes, although acute osteomyelitis is difficult to entirely exclude. Electronically Signed   By: Bertina Broccoli M.D.   On: 11/08/2023 17:04        Scheduled Meds:  apixaban   5 mg Oral BID   arformoterol   15 mcg Nebulization BID   atorvastatin   20 mg Oral Daily   budesonide  (PULMICORT ) nebulizer solution  0.25 mg Nebulization BID   cyanocobalamin   1,000 mcg Subcutaneous Daily   Followed by   Cecily Cohen ON 11/17/2023] cyanocobalamin   1,000 mcg Subcutaneous Weekly   diclofenac  Sodium  4 g Topical QID   diltiazem   180 mg Oral Daily   pantoprazole   40 mg Oral BID   potassium chloride   40 mEq Oral Q6H   pregabalin   100 mg Oral BID   tamsulosin   0.4 mg Oral QHS   Continuous Infusions:  ceFEPime  (MAXIPIME ) IV 2 g (11/10/23 0515)   vancomycin  1,250 mg (11/10/23 1145)     LOS: 2 days     Seena Dadds, MD [facility]   To contact the attending provider between 7A-7P or the covering provider during after hours 7P-7A, please log into the web site [email or web address] and access using universal Julian password for that web site. If you do not have the password, please call the hospital operator.  11/10/2023, 1:25 PM

## 2023-11-10 NOTE — Plan of Care (Signed)

## 2023-11-10 NOTE — Progress Notes (Signed)
 Inpatient Rehab Admissions Coordinator:   Per therapy recommendations, patient was screened for CIR candidacy by Wandalee Gust, MS, CCC-SLP. At this time, Pt. does not appear to demonstrate medical necessity to justify in hospital rehabilitation/CIR. However, note potential surgery may be needed, Will rescreen post op if that is that case.  Please contact me with any questions.  Wandalee Gust, MS, CCC-SLP Rehab Admissions Coordinator  [phone] (celll) [phone] (office)

## 2023-11-10 NOTE — Progress Notes (Signed)
   11/10/23 1203  TOC Brief Assessment  Insurance and Status Reviewed (Medicare A and B)  Patient has primary care physician Yes Sandy Crumb, Joselyn Nicely, MD)  Home environment has been reviewed From home with Wife  Prior level of function: independent  Prior/Current Home Services No current home services  Social Drivers of Health Review SDOH reviewed no interventions necessary  Readmission risk has been reviewed Yes (25 %  [facility] meds; 3 Admissions in last year))  Transition of care needs transition of care needs identified, TOC will continue to follow   Following for needs. He does not meet admission criteria at this time though. CIR states they can re eval if patient has surgery on foot-  TOC will continue to follow patient for any additional discharge needs

## 2023-11-10 NOTE — Consult Note (Signed)
 [facility]                                                                                        Patient Identification: Patient Name: Levi [name] MRN: [id number] Admit Date: 11/08/2023  4:55 AM Today's Date: 11/10/2023 Reason for consult: DFU  Requesting provider: Dr Rosemarie Conquest  Principal Problem:   Right foot infection   Antibiotics:  Cefepime  6/13- Metronidazole  6/13 Vancomycin  6/14-c  PTA Cipro  5/14 x 7d, 5/19 x 10d, 5/28 x 10d Flagyl  5/14 x 7d, 5/19 x 10d Doxy 5/12 x 10d   Lines/Hardware:  Assessment # RT foot osteomyelitis complicated with charcot deformity and neuropathy  # RT leg cellulitis  - no h/o DM - seen by Podiatry, discussed options of prolonged IV antibiotics vs BKA. No option for local resection  - 6/14 blood cx NGTD, ESR 30 and CRP 21  Recommendations  - continue Vancomycin  and cefepime , unlikely anaerobic infection.  - discussed prolonged IV antibiotics are unlikely to cure his infection/healing of wound and possibly cause more side effects with eventual need for amputation. He thinks similarly and wants to get rid of the source of infection for a long term solution  - Ortho consult - CT rt tibia/fibula  - Monitor CBC, BMP and vancomycin  trough - Universal/standard isolation precaution Discussed with primary team and Dr. Rosemarie Conquest  Rest of the management as per the primary team. Please call with questions or concerns.  Thank you for the consult  __________________________________________________________________________________________________________ HPI and Hospital Course: 78 Y O Male with prior medical history as below including PA-fib, COPD/asthma, hypothyroidism, HTN, HLD, CHF, BPH, GERD, Neuropathy  left second toe and right fifth toe amputation on 08/13/2023, right bone exostosis excision on 10/16/2023 ( OR cx Staph cohnii),prior PsA colonization of  rt foot,  Prior MSSA bacteremia in 02/2023, left reverse shoulder arthroplasty, bilateral hip arthroplasty and right TKA presented to the ED on 6/14 for presyncopal episode, thought to be secondary to right foot infection.  He fell while washing his hands after urinating in the restroom, banged his body against the wall and was in a slouching position when his wife found him and activated EMS.  Did strike his head against the bathroom wall but no loss of consciousness.   At ED, tachycardic and tachypneic, afebrile Labs remarkable for K2.9, lactic acid 2.4 6/14 blood cx NG in 2 days Imaging as below including rt foot   Received IV vancomycin , cefepime  and IV metronidazole  and admitted for sepsis Patient has been closely following with podiatry prior to admission for right foot wound.  Last seen on 6/11, completed course of ciprofloxacin .   ROS: General- Denies fever, chills, loss of appetite and loss of weight HEENT - Denies headache, blurry vision, neck pain, sinus pain Chest - Denies any chest pain, SOB or cough CVS- Denies any dizziness/lightheadedness,  palpitations Abdomen- Denies any nausea, vomiting, abdominal pain, hematochezia and diarrhea Neuro - Denies any weakness, numbness, tingling sensation Psych - Denies any changes in mood irritability or depressive symptoms GU- Denies any burning, dysuria, hematuria or increased frequency of urination Skin - denies any rashes/lesions MSK -  rt foot and leg pain   Past Medical History:  Diagnosis Date   Agent orange exposure 1970's   takes Imdur ,Diltiazem , and Atacand  daily   Agent orange exposure    ALLERGIC RHINITIS    Allergy     seasonal allergies   Anemia    iron  deficiency   Arthritis    Asthma    uses inhaler and nebulizers   Atrial fibrillation (HCC)    Bruises easily    d/t prednisone  daily   Cataract    Cellulitis 03/22/2020   Chest wall pain 09/21/2019   L scapula, reported 4/27 21/     CHF (congestive heart failure)  (HCC)    dx-on meds to treat   Chronic bronchitis (HCC)    used to get it q yr; last time ?2008 (04/28/2012)   Clotting disorder (HCC) 2019   On blood thinner when I bleed it is for a consideral amount of time. This is tegardless of size of wound.   COPD (chronic obstructive pulmonary disease) (HCC)    agent orange exposure   Degenerative disk disease    everywhere (04/28/2012)   Diverticulitis    Dysrhythmia    afib   Elevated uric acid in blood    takes Allopurinol  daily   Emphysema of lung (HCC)    Enlarged prostate    but not on any meds   GERD (gastroesophageal reflux disease)    takes Protonix  daily   H/O hiatal hernia    Headache    History of colonoscopy    History of pneumonia    a. 2010   Hyperlipidemia    takes Lipitor daily; pt. states he takes a preventive   Hypothyroidism    Joint pain    Joint swelling    Leukocytosis    MSSA bacteremia 03/19/2023   Nausea and vomiting 11/12/2022   Neuromuscular disorder (HCC)    bilat legs, and bilat arms   Neuropathy    Osteoporosis 2012   PAF (paroxysmal atrial fibrillation) (HCC)    a. Dx 04/2012, CHA2DS2VASc = 1 (age);  b. 04/2012 Echo: EF 40-50%, mild MR.   Personal history of colonic adenomas 02/09/2013   Pneumonia    Sepsis (HCC) 03/18/2023   Shortness of breath dyspnea    Thyroid  disease    no taking meds-caused dizziness   Past Surgical History:  Procedure Laterality Date   53 HOUR PH STUDY N/A 05/30/2021   Procedure: 24 HOUR PH STUDY;  Surgeon: Annis Kinder, DO;  Location: [facility];  Service: Gastroenterology;  Laterality: N/A;   AMPUTATION TOE Bilateral 08/13/2023   Procedure: LEFT SECOND TOE AMPUTATION AND RIGHT FIFTH TOE AMPUTATION;  Surgeon: Evertt Hoe, DPM;  Location: [facility] OR;  Service: Orthopedics/Podiatry;  Laterality: Bilateral;   ANTERIOR CERVICAL DECOMP/DISCECTOMY FUSION N/A 07/21/2012   Procedure: ANTERIOR CERVICAL DECOMPRESSION/DISCECTOMY FUSION 2 LEVELS;  Surgeon: Elna Haggis, MD;  Location: [facility];  Service: Neurosurgery;  Laterality: N/A;  C4-5 C5-6 Anterior cervical decompression/diskectomy/fusion   ANTERIOR LAT LUMBAR FUSION N/A 04/07/2017   Procedure: Thoracic twelve-Lumbar one Anterolateral decompression/fusion;  Surgeon: Elna Haggis, MD;  Location: [facility] OR;  Service: Neurosurgery;  Laterality: N/A;   ARTHRODESIS FOOT WITH WEIL OSTEOTOMY Left 11/07/2022   Procedure: LEFT THIRD METATARSOPHALANGEAL CAPSULOTOMY, WEIL OSTEOTOMY;  Surgeon: Donnamarie Gables, MD;  Location[facility] OR;  Service: Orthopedics;  Laterality: Left;  LENGTH OF SURGERY: 60 MINUTES   BACK SURGERY     x 3   BONE EXOSTOSIS EXCISION Right 10/16/2023  Procedure: EXCISION, EXOSTOSIS;  Surgeon: Dot Gazella, DPM;  Location: [facility];  Service: Orthopedics/Podiatry;  Laterality: Right;   CARDIAC CATHETERIZATION  11/2008   Dr. Renna Cary - 20% calcified non flow limiting left main, 50% EF apical hypokinesis   CARPAL TUNNEL RELEASE Bilateral    CERVICAL DISC ARTHROPLASTY N/A 04/07/2017   Procedure: Cervical six-seven Disc arthroplasty;  Surgeon: Elna Haggis, MD;  Location: [facility] OR;  Service: Neurosurgery;  Laterality: N/A;   CHEST TUBE INSERTION Left 04/11/2017   Procedure: CHEST TUBE INSERTION;  Surgeon: Heriberto London, MD;  Location: [facility] OR;  Service: Thoracic;  Laterality: Left;   CHOLECYSTECTOMY     COLONOSCOPY  2016   CG-MAC-miralax (good)servere TICS/TA x 2   ESOPHAGEAL MANOMETRY N/A 05/30/2021   Procedure: ESOPHAGEAL MANOMETRY (EM);  Surgeon: Annis Kinder, DO;  Location: [facility];  Service: Gastroenterology;  Laterality: N/A;  ph impedence   ESOPHAGOGASTRODUODENOSCOPY     EYE SURGERY Bilateral 2011   steroidal encapsulation (04/28/2012)   FOOT SURGERY Right    x 2   JOINT REPLACEMENT     Many surgeries including 5 joit replacements   KNEE ARTHROSCOPY Bilateral    LATERAL / POSTERIOR COMBINED FUSION LUMBAR SPINE  2012   LEFT ATRIAL APPENDAGE OCCLUSION N/A 02/22/2021    Procedure: LEFT ATRIAL APPENDAGE OCCLUSION;  Surgeon: Boyce Byes, MD;  [facility];  Service: Cardiovascular;  Laterality: N/A;   NEUROPLASTY / TRANSPOSITION ULNAR NERVE AT ELBOW Bilateral    POLYPECTOMY  2016   severe TICS/TA x 2   POSTERIOR CERVICAL LAMINECTOMY WITH MET- RX Right 06/11/2021   Procedure: Right Cervical six-seven  Laminectomy and foraminotomy with metrex;  Surgeon: Elna Haggis, MD;  Location: [facility];  Service: Neurosurgery;  Laterality: Right;   REFRACTIVE SURGERY Left 07/07/2023   laser surgery but still has floaters   REVERSE SHOULDER ARTHROPLASTY  04/28/2012   Procedure: REVERSE SHOULDER ARTHROPLASTY;  Surgeon: Derald Flattery, MD;  [facility] OR;  Service: Orthopedics;  Laterality: Left;  Left shouder reverse total shoulder arthroplasty   SHOULDER SURGERY     SPINE SURGERY     I have had 7 spinal surgeries   TEE WITHOUT CARDIOVERSION N/A 02/22/2021   Procedure: TRANSESOPHAGEAL ECHOCARDIOGRAM (TEE);  Surgeon: Boyce Byes, MD;  Location: [facility] INVASIVE CV LAB;  Service: Cardiovascular;  Laterality: N/A;   TENDON REPAIR Right 08/07/2020   Procedure: RIGHT HAND LIGAMENT RECONSTRUCTION AND TENDON INTERPOSITION;  Surgeon: Neil Balls, MD;  Location: [facility];  Service: Orthopedics;  Laterality: Right;   TONSILLECTOMY AND ADENOIDECTOMY  1954   TOTAL HIP ARTHROPLASTY Left 2011   left (04/28/2012)   TOTAL HIP ARTHROPLASTY Right 07/31/2015   Procedure: TOTAL HIP ARTHROPLASTY ANTERIOR APPROACH;  Surgeon: Neil Balls, MD;  [facility];  Service: Orthopedics;  Laterality: Right;   TOTAL KNEE ARTHROPLASTY  01/10/2012   Procedure: TOTAL KNEE ARTHROPLASTY;  Surgeon: Boston Byers, MD;  Location: [facility] OR;  Service: Orthopedics;;  left total knee arthroplasty   TOTAL KNEE ARTHROPLASTY Right 06/17/2014   Procedure: TOTAL KNEE ARTHROPLASTY;  Surgeon: Boston Byers, MD;  Location: [facility] OR;  Service: Orthopedics;  Laterality: Right;    toupet fundolplication  11/01/2021   TRANSESOPHAGEAL ECHOCARDIOGRAM (CATH LAB) N/A 03/24/2023   Procedure: TRANSESOPHAGEAL ECHOCARDIOGRAM;  Surgeon: Hazle Lites, MD;  [facility]: [facility] CV LAB;  Service: Cardiovascular;  Laterality: N/A;     Scheduled Meds:  apixaban   5 mg Oral BID   arformoterol   15 mcg Nebulization BID   atorvastatin   20 mg Oral Daily   budesonide  (PULMICORT ) nebulizer solution  0.25 mg Nebulization BID   cyanocobalamin   1,000 mcg Subcutaneous Daily   Followed by   Cecily Cohen ON 11/17/2023] cyanocobalamin   1,000 mcg Subcutaneous Weekly   diclofenac  Sodium  4 g Topical QID   diltiazem   180 mg Oral Daily   pantoprazole   40 mg Oral BID   potassium chloride   40 mEq Oral Q6H   pregabalin   100 mg Oral BID   tamsulosin   0.4 mg Oral QHS   Continuous Infusions:  ceFEPime  (MAXIPIME ) IV 2 g (11/10/23 0515)   vancomycin  1,250 mg (11/10/23 1145)   PRN Meds:.ipratropium-albuterol , oxyCODONE -acetaminophen   Allergies  Allergen Reactions   Levaquin  [Levofloxacin ] Itching    Tolerated ciprofloxacin  in 2022.   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 Daughter lives two miles away. 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 Enjoys being active and working.      Tobacco use, amount per day  now: None   Past tobacco use, amount per day: None   How many years did you use tobacco:   Alcohol use (drinks per week): None   Diet:   Do you drink/eat things with caffeine: Coffee   Marital status:  Married                                What year were you married? 1973   Do you live in a house, apartment, assisted living, condo, trailer, etc.? House    Is it one or more stories? 3 stories   How many persons live in your home? 2   Do you have pets in your home?( please list) 3 [employer]    Highest Level of education completed? Engineer, maintenance (IT).   Current or past profession: Police    Do you exercise?    Yes                              Type and how often? 5 times weekly   Do you have a living will? Yes   Do you have a DNR form?     Yes                              If not, do you want to discuss one?   Do you have signed POA/HPOA forms?    Yes                    If so, please bring to you appointment  Do you have any difficulty bathing or dressing yourself?    Do you have any difficulty preparing food or eating? No   Do you have any difficulty managing your medications? No   Do you have any difficulty managing your finances? No   Do you have any difficulty affording your medications? No   Social Drivers of Corporate investment banker Strain: Low Risk  (10/23/2023)   Overall Financial Resource Strain (CARDIA)    Difficulty of Paying Living Expenses: Not hard at all  Food Insecurity: No Food Insecurity (11/08/2023)   Hunger Vital Sign    Worried About Running Out of Food in the Last Year: Never true    Ran Out of Food in the Last Year: Never true  Transportation Needs: No Transportation Needs (11/08/2023)   PRAPARE - Administrator, Civil Service (Medical): No    Lack of Transportation (Non-Medical): No  Physical Activity: Sufficiently Active (10/23/2023)   Exercise Vital Sign    Days of Exercise per Week: 5 days    Minutes of Exercise per Session: 60 min  Stress:  No Stress Concern Present (10/23/2023)   [employer] of Occupational Health - Occupational Stress Questionnaire    Feeling of Stress : Not at all  Social Connections: Socially Integrated (11/08/2023)   Social Connection and Isolation Panel    Frequency of Communication with Friends and Family: More than three times a week    Frequency of Social Gatherings with Friends and Family: More than three times a week    Attends Religious Services: More than 4 times per year    Active Member of [employer] or Organizations: Yes    Attends Engineer, structural: More than 4 times per year    Marital Status: Married  Catering manager Violence: Not At Risk (11/08/2023)   Humiliation, Afraid, Rape, and Kick questionnaire    Fear of Current or Ex-Partner: No    Emotionally Abused: No    Physically Abused: No    Sexually Abused: No   Family History  Problem Relation Age of Onset   Diabetes Mother    Heart disease Father        Died 65, MI   Cancer Father    Hyperlipidemia Father    Stroke Father    Prostate cancer Brother    Colon polyps Brother    Colon polyps Brother    Asthma Brother    COPD Brother    Colon cancer Brother        Dx age 44   Cancer Brother    Colon cancer Other    Esophageal cancer Neg Hx    Rectal cancer Neg Hx    Stomach cancer Neg Hx    Pancreatic cancer Neg Hx    Kidney disease Neg Hx    Liver disease Neg Hx    Neuropathy Neg Hx    Vitals BP 107/76 (BP Location: Left Arm)   Pulse (!) 102   Temp 98.5 F (36.9 C) (Oral)   Resp 14   Ht 6' 2 (1.88 m)   Wt 93 kg   SpO2 96%   BMI 26.32 kg/m    Physical Exam Constitutional: Elderly male sitting in the bed, having lunch    Comments: Not in acute distress, HEENT WNL  Cardiovascular:     Rate and Rhythm: Normal rate and regular rhythm.     Heart sounds: S1 and 2  Pulmonary:     Effort: Pulmonary effort is normal.  Comments: Normal breath sounds  Abdominal:     Palpations: Abdomen is soft.      Tenderness: Nondistended and nontender  Musculoskeletal:        General: Right foot with plantar ulcer at the cuboid with some purulent drainage, necrotic tissue at the wound bed.  Charcot deformity of the right foot.  Prior partial fifth ray amputation of right foot.   Right leg with erythema, warmth and tenderness.  No crepitus or fluctuance.  No signs of infection in the right knee.  No signs of nec fasc  Left foot with prior left second toe amputation with well-healed wound without any signs of infection.  Charcot deformity.+  Skin:    Comments: No rashes  Neurological:     General: Awake, alert and oriented, grossly nonfocal  Psychiatric:        Mood and Affect: Mood normal.    Pertinent Microbiology Results for orders placed or performed during the hospital encounter of 11/08/23  Culture, blood (Routine x 2)     Status: None (Preliminary result)   Collection Time: 11/08/23  5:14 AM   Specimen: BLOOD RIGHT FOREARM  Result Value Ref Range Status   Specimen Description BLOOD RIGHT FOREARM  Final   Special Requests   Final    BOTTLES DRAWN AEROBIC AND ANAEROBIC Blood Culture adequate volume   Culture   Final    NO GROWTH 2 DAYS Performed at [facility], [address] [place], [place] [address]    Report Status PENDING  Incomplete  Culture, blood (Routine x 2)     Status: None (Preliminary result)   Collection Time: 11/08/23  5:53 AM   Specimen: BLOOD  Result Value Ref Range Status   Specimen Description BLOOD BLOOD RIGHT ARM  Final   Special Requests   Final    BOTTLES DRAWN AEROBIC AND ANAEROBIC Blood Culture adequate volume   Culture   Final    NO GROWTH 2 DAYS Performed at [facility], [address], [place], [place] [address]    Report Status PENDING  Incomplete   *Note: Due to a large number of results and/or encounters for the requested time period, some results have not been displayed. A complete set of results can be found in Results Review.    Pertinent Lab seen by me:    Latest Ref Rng & Units 11/10/2023    5:51 AM 11/09/2023    7:53 AM 11/08/2023    5:14 AM  CBC  WBC 4.0 - 10.5 K/uL 7.6  10.3  13.8   Hemoglobin 13.0 - 17.0 g/dL 9.5  69.6  29.5   Hematocrit 39.0 - 52.0 % 30.0  32.0  35.8   Platelets 150 - 400 K/uL 129  144  172       Latest Ref Rng & Units 11/10/2023    5:51 AM 11/09/2023    7:53 AM 11/08/2023    5:14 AM  CMP  Glucose 70 - 99 mg/dL 284  132  88   BUN 8 - 23 mg/dL 15  18  17    Creatinine 0.61 - 1.24 mg/dL 4.40  1.02  7.25   Sodium 135 - 145 mmol/L 133  138  138   Potassium 3.5 - 5.1 mmol/L 3.4  3.9  2.9   Chloride 98 - 111 mmol/L 105  105  100   CO2 22 - 32 mmol/L 22  23  26    Calcium  8.9 - 10.3 mg/dL 7.5  8.2  8.5   Total Protein 6.5 - 8.1 g/dL   5.6   Total Bilirubin 0.0 - 1.2 mg/dL   1.1   Alkaline Phos 38 - 126 U/L   66   AST 15 - 41 U/L   19   ALT 0 - 44 U/L   13     Pertinent Imagings/Other Imagings Plain films and CT images have been personally visualized and interpreted; radiology reports have been reviewed. Decision making incorporated into the Impression / Recommendations.  MR FOOT RIGHT W WO CONTRAST Result Date: 11/08/2023 CLINICAL DATA:  Right foot infection. Clinical concern for acute osteomyelitis. History of Charcot arthropathy and osteomyelitis. EXAM: MRI OF THE RIGHT FOREFOOT WITHOUT AND WITH CONTRAST TECHNIQUE: Multiplanar, multisequence MR imaging of the right foot was performed before and after the administration of intravenous contrast. CONTRAST:  10mL GADAVIST  GADOBUTROL  1 MMOL/ML IV SOLN COMPARISON:  right foot radiographs 11/08/2023 and 10/22/2023, CT right foot 10/08/2023 FINDINGS: Soft tissue/bones/Joint/Cartilage There is new high-grade ulceration within the lateral midfoot, plantar to the cuboid in a region measuring up to approximately 10 mm in transverse dimension and 10 mm in the long axis of the foot (axial series 6, image 33 and sagittal series 9, image 23). There is a  sinus tract measuring up to 9 mm in long axis of the foot and 16 mm in transverse dimension (post-contrast axial series 11, images 32 and 33, postcontrast sagittal series 12, image 23; axial series 6, image 33 and coronal series 7 images 7 through 9). This extends superiorly to the distal lateral aspect of the cuboid. Additional scattered low signal with blooming artifact that may represent subcutaneous air and a tiny wound measuring up to approximately 6 mm just lateral to the base of the fifth metatarsal (axial series 4, image 32 and coronal series 7, image 12) suspicious for a sinus tract extending to the base of the fifth metatarsal. There is high-grade marrow edema throughout the plantar and lateral aspects of the cuboid and throughout the entire adjacent proximal 2.9 cm of the fifth metatarsal. Mild to moderate plantar cortical erosion (axial series 4, image 33) of the cuboid and base of the fifth metatarsal. Findings are indicative of osteomyelitis of the lateral plantar cuboid and the base of the fifth metatarsal. Thin linear fluid bright signal at the lateral aspect of the fifth metatarsal proximal metaphysis may represent an acute nondisplaced fracture (coronal series 7, image 12), possibly pathologic from weakened bone underlying osteomyelitis. There is again postsurgical change of amputation of the fifth toe phalanges. There is metallic susceptibility artifact in between the fifth and fourth metatarsal heads, corresponding to a focal density on prior CT and radiographs that may represent postsurgical metal. Postsurgical changes again noted of prior resection of the second through fourth proximal phalangeal heads, better seen on prior CT. There are again advanced Charcot arthropathy changes of the midfoot with second through fifth metatarsal mild homolateral subluxation. There is again plantar migration of the midfoot, especially the cuboid, with rocker bottom deformity. Marrow edema within the distal  aspect of the lateral cuneiform with high-grade marrow edema within the proximal 15 mm of the fourth metatarsal (coronal series 7 images 13 through 15) are suspicious for osteomyelitis on both sides of the fourth tarsometatarsal joint (also coronal series 7, image 17 and sagittal series 9, image 18). Moderate marrow edema at the first and second tarsometatarsal joints is nonspecific and may represent Charcot arthropathy degenerative changes although acute osteomyelitis is difficult to entirely exclude. Ligaments Limited  evaluation given large field-of-view and decreased resolution. There appears to be disruption of the Lisfranc ligament complex, consistent with the lateral subluxation of the second through fifth metatarsals. Muscles and Tendons Moderate focal fluid within the flexor hallucis longus tendon sheath within the plantar midfoot just proximal to the intersection with the flexor digitorum longus tendon at the knot of Henry (axial series 6, image 28). Moderate edema throughout the plantar midfoot to forefoot intrinsic musculature. Soft tissues Moderate diffuse subcutaneous fat edema and swelling about the ankle and midfoot. IMPRESSION: 1. New high-grade ulceration within the lateral midfoot, plantar to the cuboid, with a sinus tract extending superiorly to the distal lateral aspect of the cuboid. Additional tiny wound just lateral to the base of the fifth metatarsal with a sinus tract extending to the base of the fifth metatarsal. 2. Osteomyelitis of the plantar lateral cuboid and base of the fifth metatarsal. Additional marrow edema within the distal aspect of the lateral cuneiform and proximal 15 mm of the fourth metatarsal are suspicious for osteomyelitis on both sides of the fourth tarsometatarsal joint. 3. Thin linear fluid bright signal at the lateral aspect of the fifth metatarsal proximal metaphysis may represent an acute nondisplaced fracture, possibly pathologic from the weakened bone from the  underlying osteomyelitis. 4. Advanced Charcot arthropathy changes of the midfoot with second through fifth metatarsal homolateral subluxation and plantar migration of the midfoot, especially the cuboid, with rocker-bottom deformity. 5. Moderate marrow edema at the first and second tarsometatarsal joints is nonspecific and may represent Charcot arthropathy degenerative changes, although acute osteomyelitis is difficult to entirely exclude. Electronically Signed   By: Bertina Broccoli M.D.   On: 11/08/2023 17:04   DG Foot Complete Right Result Date: 11/08/2023 CLINICAL DATA:  A right foot infection. EXAM: RIGHT FOOT COMPLETE - 3+ VIEW COMPARISON:  Right foot series 10/22/2023 FINDINGS: Three views. There is diffuse moderate edema. Skin staples previously seen overlying the lateral midfoot have been removed in the interval and resolution of patchy soft tissue gas in the area is noted. There are heavy vascular calcifications. Osteopenia without evidence of fractures with old fifth toe amputation. Remote resection of the second through fourth proximal phalangeal heads. There is advanced Charcot change especially at the Lisfranc and tarsometatarsal joints, lateral subluxations as seen previously, pes planus and midfoot sag with chronic rocker bottom deformity. There is a small plantar calcaneal spur. No destructive bone lesions are seen. IMPRESSION: 1. Diffuse moderate edema. 2. Interval removal of skin staples overlying the lateral midfoot with resolution of patchy soft tissue gas in the area. 3. Advanced Charcot change with lateral metatarsal subluxations, pes planus and midfoot sag with chronic rocker bottom deformity. 4. Osteopenia. No evidence of fractures or focal destructive lesions. 5. Heavy vascular calcifications. 6. Old fifth toe amputation and remote resection of the second through fourth proximal phalangeal heads. Electronically Signed   By: Denman Fischer M.D.   On: 11/08/2023 06:36   CT CERVICAL SPINE WO  CONTRAST Result Date: 11/08/2023 EXAM: CT CERVICAL SPINE WITHOUT CONTRAST 11/08/2023 05:17:53 AM TECHNIQUE: CT of the cervical was performed without the administration of intravenous contrast. Multiplanar reformatted images are provided for review. Automated exposure control, iterative reconstruction, and/or weight based adjustment of the mA/kV was utilized to reduce the radiation dose to as low as reasonably achievable. COMPARISON: None available. CLINICAL HISTORY: Polytrauma, blunt. Chief complaints; Level 2 FOT; CT HEAD WO CONTRAST; Head trauma, moderate-severe; CT CERVICAL SPINE WO CONTRAST; Polytrauma, blunt FINDINGS: CERVICAL SPINE: BONES AND ALIGNMENT: Solid  fusion of the cervical spine is present at C4-5, C5-6, and C6-7. Hardware is intact. Straightening of the normal cervical lordosis is present. DEGENERATIVE CHANGES: No significant degenerative changes. SOFT TISSUES: No prevertebral soft tissue swelling. Mild atherosclerotic changes are present at the carotid bifurcations bilaterally without definite stenosis. IMPRESSION: 1. No acute abnormality of the cervical spine related to the polytrauma. 2. Solid fusion of the cervical spine at C4-5, C5-6, and C6-7 with intact hardware. Electronically signed by: Audree Leas MD 11/08/2023 05:29 AM EDT RP Workstation: [id number]   DG Chest Port 1 View Result Date: 11/08/2023 CLINICAL DATA:  Fall. EXAM: PORTABLE CHEST 1 VIEW COMPARISON:  None Available. FINDINGS: Low volume film. The cardio pericardial silhouette is enlarged. The lungs are clear without focal pneumonia, edema, pneumothorax or pleural effusion. Multiple nonacute rib fractures evident bilaterally. Status post left shoulder replacement. Telemetry leads overlie the chest. IMPRESSION: Low volume film without acute cardiopulmonary findings. Electronically Signed   By: Donnal Fusi M.D.   On: 11/08/2023 05:27   CT HEAD WO CONTRAST Result Date: 11/08/2023 EXAM: CT HEAD WITHOUT CONTRAST  11/08/2023 05:17:53 AM TECHNIQUE: CT of the head was performed without the administration of intravenous contrast. Automated exposure control, iterative reconstruction, and/or weight based adjustment of the mA/kV was utilized to reduce the radiation dose to as low as reasonably achievable. COMPARISON: CT head without contrast from 09/13/2020. MR head without and with contrast from 05/21/2022. CLINICAL HISTORY: Head trauma, moderate-severe. Chief complaints; Level 2 FOT; Head trauma, moderate-severe; Polytrauma, blunt. FINDINGS: BRAIN AND VENTRICLES: There is no acute intracranial hemorrhage, mass effect or midline shift. No abnormal extra-axial fluid collection. The gray-white differentiation is maintained without an acute infarct. There is no hydrocephalus. Periventricular white matter hypoattenuation, mildly advanced for age, is stable. ORBITS: Bilateral lens replacements are noted. The globes and orbits are otherwise within normal limits. SINUSES: The visualized paranasal sinuses and mastoid air cells demonstrate no acute abnormality. SOFT TISSUES AND SKULL: No acute abnormality of the visualized skull or soft tissues. Atherosclerotic calcifications are present in the cavernous carotid arteries bilaterally. No hyperdense vessel is present. IMPRESSION: 1. No acute intracranial abnormality related to the head trauma. 2. Stable periventricular white matter hypoattenuation, mildly advanced for age. Electronically signed by: Audree Leas MD 11/08/2023 05:25 AM EDT RP Workstation: [id number]   DG Foot Complete Right Result Date: 10/22/2023 Please see detailed radiograph report in office note.  DG Foot Complete Right Result Date: 10/13/2023 Please see detailed radiograph report in office note.   I spent 86  minutes involved in face-to-face and non-face-to-face activities for this patient on the day of the visit. Professional time spent includes the following activities: Preparing to see the patient  (review of tests), Obtaining and reviewing separately obtained history (admission/discharge record), Performing a medically appropriate examination and evaluation , Ordering medications/labs, referring and communicating with other health care professionals, Documenting clinical information in the EMR, Independently interpreting results (not separately reported), Communicating results to the patient/family, Counseling and educating the patient/family and Care coordination (not separately reported).  Electronically signed by:   Plan d/w requesting provider as well as ID pharm D  Of note, portions of this note may have been created with voice recognition software. While this note has been edited for accuracy, occasional wrong-word or 'sound-a-like' substitutions may have occurred due to the inherent limitations of voice recognition software.   Terre Ferri, MD Infectious Disease Physician [facility] Pager: [phone]

## 2023-11-10 NOTE — TOC Initial Note (Signed)
 Transition of Care [facility]) - Initial/Assessment Note    Patient Details  Name: Levi [name] MRN: [id number] Date of Birth: 29-Mar-1946  Transition of Care [facility]) CM/SW Contact:    Jannice Mends, LCSW Phone Number: 11/10/2023, 1:14 PM  Clinical Narrative:                 CSW received consult for possible SNF placement at time of discharge. CSW spoke with patient. Patient reported that patient's spouse is currently unable to care for patient at their home given patient's current physical needs and fall risk. Patient expressed understanding of PT recommendation and is agreeable to SNF placement at time of discharge. CSW discussed insurance authorization process and will provide Medicare SNF ratings list. CSW will send out referrals for review and provide bed offers as available once medical workup is complete. Patient stated he is hoping to see his granddaughter before agreeing to have surgery.   Skilled Nursing Rehab Facilities-   [email or web address]   Ratings out of 5 stars (5 the highest)  Name Address  Phone # Quality Care Staffing Health Inspection Overall  [facility] [address], [place] [phone] 3 1 4 3   [facility] [address] [place] [phone] 5 2 4 5   [facility] [address], [facility] [phone] 2 1 1 1   [facility] [address], [place] [phone] 4 3 4 4   [facility]  [address], [place] [phone] 5 3 5 5   [facility] [address] [facility] [phone] 5 3 2 3   [facility] [address] [place] [phone] 5 1 2 2   [facility] [address], [place] [phone] 2 3 3 3   [address] ([place]) [address] [place] [phone] 1 3 3 2   [facility] [address] [place], [place] [phone] 3 1 2 1   [facility] ([place]) [address], [place] [phone] 3 1 1 1   [facility] [address] [place] [phone] 3 3 4 4   [facility] [address] [place] [phone] 4 4 3 3   [facility] ([facility]) [address], [place] [phone] 1 1 4 2           [facility] [address] [place] [phone] 3 1 5 4   [facility] [address], [place] [phone] 4 1 1 1   [facility]  [address] [place] [phone] 2 3 2 2   [facility] [place] [address] [phone] 2 2 4 4   [facility] [address] [place] [address], [place] [phone] 2 2 3 3           [facility] [address] [place] [phone] 2 1 2 1   [facility] (No [facility]) [address], [place] [phone] 5 4 5 5   [facility] [address] [facility] [phone] 3 4 2 2   [facility] [address], [place] [phone] 3 1 2 1   [facility] [address] [place] [phone] 3 2 2 2   [facility] [address], [place] [phone] 1 3 3 2   [facility] [address], [place] [phone] 2 2 3 3   [facility] [address] [place], [place] [phone] 2 1 4 3   [facility] [address], [facility] [phone] 2 1 1 1   [facility] [address], [place] [phone] 2 1 2 1   [facility] [address] [place] [phone] 3 1 2 1           [facility] [address] [place] [phone] 4 1 2 1   [facility] [address] [place]  [phone] 3 4 4 4   [facility] (No [employer]) [address] [place] [phone] 3 2 4 4   [facility] [address], [place] [phone] 2 2 3 3   [facility] [address] [place] [phone] 5 3 5 5   [facility] [address], [place] [phone] 2 1 1 1   [facility] [address] [place], [place] [phone] 3 5 5 5           [facility] [address] [employer], [place] [phone]  5 4 5 5   [facility])  [address] [place] [phone] 1 1 2 1   [facility] [address] [place], [place] [phone]  2 4 4   [facility] [address], [place] [phone] 3 5 5 5   [address] [place], [place] [phone] 4 2 2 2   [facility]) [address] [place] [phone] 1 1 3 1      Expected Discharge Plan: Skilled Nursing Facility Barriers to Discharge: Continued Medical Work up, SNF Pending bed offer   Patient Goals and CMS Choice Patient states their goals for this hospitalization and ongoing recovery are:: Rehab CMS Medicare.gov Compare Post Acute Care list provided to:: Patient Choice offered to / list presented to : Patient [place] ownership interest in [facility].provided to:: Patient    Expected Discharge Plan and Services In-house Referral: Clinical Social Work   Post Acute Care Choice: Skilled Nursing Facility Living arrangements for the past 2 months: Single Family Home                                      Prior Living Arrangements/Services Living arrangements for the past 2 months: Single Family Home Lives with:: Spouse Patient language and need for interpreter reviewed:: Yes Do you feel safe going back to the place where you live?: Yes      Need for Family Participation in Patient Care: Yes (Comment) Care giver support system in place?: Yes (comment)   Criminal Activity/Legal Involvement Pertinent to Current Situation/Hospitalization: No - Comment as needed  Activities of Daily Living      Permission Sought/Granted Permission sought to share information with : Oceanographer granted to share information with : Yes, Verbal Permission Granted     Permission granted to share info w AGENCY: SNFs        Emotional Assessment Appearance:: Appears stated age Attitude/Demeanor/Rapport: Engaged Affect  (typically observed): Pleasant Orientation: : Oriented to Self, Oriented to Place, Oriented to  Time, Oriented to Situation Alcohol / Substance Use: Not Applicable Psych Involvement: No (comment)  Admission diagnosis:  Hypokalemia [E87.6] Wound infection [T14.8XXA, L08.9] Cellulitis of right lower extremity [L03.115] Right foot infection [L08.9] Fall, initial encounter [W19.XXXA] Sepsis, due to unspecified organism, unspecified whether acute organ dysfunction present [facility]) [A41.9] Patient Active Problem List   Diagnosis Date Noted   Right foot infection 11/08/2023   Ulcerated, foot, right, with fat layer exposed (HCC) 10/08/2023   Blood blister 09/04/2023   Osteomyelitis of fifth toe of right foot (HCC) 08/13/2023   Osteomyelitis (HCC) 08/13/2023   Skin ulcer of second toe of left  foot with necrosis of bone (HCC) 08/12/2023   Osteomyelitis of second toe of left foot (HCC) 08/12/2023   Hematochezia 07/25/2023   Pain in toe of right fifth foot 06/27/2023   Cellulitis 04/17/2023   Closed fracture of ramus of right pubis (HCC) 04/07/2023   S/P PICC central line placement 03/31/2023   Venous insufficiency of both lower extremities 03/20/2023   Chronic superficial venous thrombosis of both lower extremities 11/18/2022   Venous stasis ulcer (HCC) 09/13/2022   Primary osteoarthritis of right shoulder, status post left shoulder arthroplasty 07/05/2022   Shingles 05/16/2022   Vertigo 04/04/2022   BPH (benign prostatic hyperplasia) 02/21/2022   Annual physical exam 01/22/2022   Charcot's joint of foot, right 01/22/2022   Lower extremity edema 08/08/2021   Heartburn    Atypical chest pain    Cervical radicular pain 06/11/2021   Chronic anticoagulation 03/15/2021   Peripheral neuropathy 02/22/2021   Chronic systolic heart failure (HCC) 02/22/2021   Essential (primary) hypertension 11/15/2020   Arthritis of carpometacarpal (CMC) joint of right thumb 08/07/2020   COPD (chronic obstructive  pulmonary disease) (HCC) 03/21/2020   GERD (gastroesophageal reflux disease) status post Nissen fundoplication 12/03/2017   Lumbar stenosis status post T12-S1 fusion 04/07/2017   Cervical spondylosis with radiculopathy 01/01/2017   Cervical radiculopathy 04/11/2016   History of total right hip arthroplasty 07/31/2015   Chronic low back pain 10/13/2014   Primary osteoarthritis of right knee 06/17/2014   Thrush of mouth and esophagus (HCC) 04/03/2014   Hyperlipidemia    History of colonic polyps 02/09/2013   Paroxysmal atrial fibrillation (HCC) 05/11/2012   Osteoarthritis of left knee 01/10/2012   Seasonal and perennial allergic rhinitis 07/23/2010   Asthma, moderate persistent 03/06/2009   PCP:  Gean Keels, MD Pharmacy:   [phone] [place], [place] - [address] [place] [address] Phone: [phone] Fax: [phone]  [facility] [address] [place] [address] Phone: [phone] Fax: [phone]     Social Drivers of Health (SDOH) Social History: SDOH Screenings   Food Insecurity: No Food Insecurity (11/08/2023)  Housing: Low Risk  (11/08/2023)  Transportation Needs: No Transportation Needs (11/08/2023)  Utilities: Not At Risk (11/08/2023)  Alcohol Screen: Low Risk  (10/23/2023)  Depression (PHQ2-9): Low Risk  (10/23/2023)  Financial Resource Strain: Low Risk  (10/23/2023)  Physical Activity: Sufficiently Active (10/23/2023)  Social Connections: Socially Integrated (11/08/2023)  Stress: No Stress Concern Present (10/23/2023)  Tobacco Use: Low Risk  (11/07/2023)  Health Literacy: Adequate Health Literacy (10/23/2023)   SDOH Interventions:     Readmission Risk Interventions     No data to display

## 2023-11-10 NOTE — Plan of Care (Signed)

## 2023-11-10 NOTE — Consult Note (Signed)
 PODIATRY CONSULTATION  NAME Levi [name] MRN [id number] DOB [birth year] DOA 11/08/2023   Reason for consult: Sepsis, right foot ulceration concern for osteomyelitis  Attending/Consulting physician: Henrietta Lofts MD  History of present illness: Levi [name] is a 78 y.o. male with medical history significant of PAF on Eliquis , HFpEF (EF 40-45% in 02/2023), HTN, HLD, COPD, BPH, GERD, and s/p recent LLE 2nd digit/RLE  5th digit ray amputation on 08/13/2023 p/w presyncopal episode iso sepsis 2/2 RLE cellulitis vs OM.   Pt states that he was in his USOH until 0400 this morning when he fell to the right while washing his hands after urinating. The pt reports waking up as usual, walking to restroom w/o issue, and using the restroom w/o issue before becoming weak and slumping to right. He banged his body against the wall and was in a slouching position when his wife found him and activated EMS. He denies LOC, but did strike his head against the bathroom wall. No obvious laceration or hemorrhage. Pt denies any h/o recurrent falls or syncopal episodes.  Discussed with patient findings from MRI and concern for osteomyelitis in the right foot.  We had a discussion regarding his treatment options.  Has a history of the incision and drainage of the right foot with planning exostectomy on 10/16/2023.  This was done with Dr. Luster Salters.  He was on ciprofloxacin  500 mg as an outpatient.  On last visit there was noted to be decent improvement however after coming off antibiotics he got sick and fell prompting his admission.  Past Medical History:  Diagnosis Date   Agent orange exposure 1970's   takes Imdur ,Diltiazem , and Atacand  daily   Agent orange exposure    ALLERGIC RHINITIS    Allergy     seasonal allergies   Anemia    iron  deficiency   Arthritis    Asthma    uses inhaler and nebulizers   Atrial fibrillation (HCC)    Bruises easily    d/t prednisone  daily   Cataract    Cellulitis 03/22/2020   Chest  wall pain 09/21/2019   L scapula, reported 4/27 21/     CHF (congestive heart failure) (HCC)    dx-on meds to treat   Chronic bronchitis (HCC)    used to get it q yr; last time ?2008 (04/28/2012)   Clotting disorder (HCC) 2019   On blood thinner when I bleed it is for a consideral amount of time. This is tegardless of size of wound.   COPD (chronic obstructive pulmonary disease) (HCC)    agent orange exposure   Degenerative disk disease    everywhere (04/28/2012)   Diverticulitis    Dysrhythmia    afib   Elevated uric acid in blood    takes Allopurinol  daily   Emphysema of lung (HCC)    Enlarged prostate    but not on any meds   GERD (gastroesophageal reflux disease)    takes Protonix  daily   H/O hiatal hernia    Headache    History of colonoscopy    History of pneumonia    a. 2010   Hyperlipidemia    takes Lipitor daily; pt. states he takes a preventive   Hypothyroidism    Joint pain    Joint swelling    Leukocytosis    MSSA bacteremia 03/19/2023   Nausea and vomiting 11/12/2022   Neuromuscular disorder (HCC)    bilat legs, and bilat arms   Neuropathy    Osteoporosis 2012  PAF (paroxysmal atrial fibrillation) (HCC)    a. Dx 04/2012, CHA2DS2VASc = 1 (age);  b. 04/2012 Echo: EF 40-50%, mild MR.   Personal history of colonic adenomas 02/09/2013   Pneumonia    Sepsis (HCC) 03/18/2023   Shortness of breath dyspnea    Thyroid  disease    no taking meds-caused dizziness       Latest Ref Rng & Units 11/10/2023    5:51 AM 11/09/2023    7:53 AM 11/08/2023    5:14 AM  CBC  WBC 4.0 - 10.5 K/uL 7.6  10.3  13.8   Hemoglobin 13.0 - 17.0 g/dL 9.5  30.8  65.7   Hematocrit 39.0 - 52.0 % 30.0  32.0  35.8   Platelets 150 - 400 K/uL 129  144  172        Latest Ref Rng & Units 11/10/2023    5:51 AM 11/09/2023    7:53 AM 11/08/2023    5:14 AM  BMP  Glucose 70 - 99 mg/dL 846  962  88   BUN 8 - 23 mg/dL 15  18  17    Creatinine 0.61 - 1.24 mg/dL 9.52  8.41  3.24   Sodium 135  - 145 mmol/L 133  138  138   Potassium 3.5 - 5.1 mmol/L 3.4  3.9  2.9   Chloride 98 - 111 mmol/L 105  105  100   CO2 22 - 32 mmol/L 22  23  26    Calcium  8.9 - 10.3 mg/dL 7.5  8.2  8.5       Physical Exam: Lower Extremity Exam Left foot without evidence of infection.  Prior left second toe amputation is well-healed without any issues.  No evidence of infection of the left hallux or heel.  Right foot with ulceration at the plantar aspect of the cuboid with purulent fluid drainage as well as necrotic tissues in the wound bed.  Wound does probe down towards the cuboid and TMT J.  Decreased pedal pulses Charcot deformity of the right foot.  Prior partial fifth ray amputation right foot Sensation absent to foot secondary to neuropathy      ASSESSMENT/PLAN OF CARE 78 y.o. male with PMHx significant for  PAF on Eliquis , HFpEF (EF 40-45% in 02/2023), HTN, HLD, COPD, BPH, GERD, and s/p recent LLE 2nd digit/RLE  5th digit ray amputation on 08/13/2023  with sepsis secondary to chronic ulceration plantar lateral rear foot with concern for underlying osteomyelitis of the cuboid and 4th and 5th metatarsal base.  WBC 13.8 on admission now 7.6 CRP 21, ESR 30 MRI right foot with and without contrast: Ulceration plantar to the cuboid with sinus tract extending to the cuboid, concern for osteomyelitis of the plantar lateral cuboid and base of the fifth metatarsal and possibly base of fourth metatarsal on both sides of the 4th and 5th TMT J possible pathologic fracture of the metatarsal base, Charcot arthropathy  -Extensive discussion with the patient regarding treatment options at this time.  Unfortunately given the extent of suspected osteomyelitis on MRI which I feel is valid based on the location chronicity and clinical appearance of the ulceration, no option for local resection of infected bone exists in the midfoot area.  Therefore his options are likely long-term IV antibiotic therapy based on prior  culture data from Dr. Luster Salters procedure versus below-knee amputation.  Discussed both of these options as well as the risk and benefits with him.  He would like to think further on it as well  as discussed with infectious disease. -I discussed with infectious disease team and they will see the patient to discuss these options with him as well. - Continue IV abx broad spectrum pending further culture data - Anticoagulation: Okay to continue at this time - Wound care: Continue Betadine  paint and adhesive border foam dressing to the plantar ulceration on the right foot - WB status: Weightbearing as tolerated right foot postoperative shoe - Will continue to follow   Thank you for the consult.  Please contact me directly with any questions or concerns.           Maridee Shoemaker, DPM [facility]    [address] [place], [place] [address]                Office [phone]  Fax [phone]

## 2023-11-11 DIAGNOSIS — M86271 Subacute osteomyelitis, right ankle and foot: Secondary | ICD-10-CM

## 2023-11-11 DIAGNOSIS — L03115 Cellulitis of right lower limb: Secondary | ICD-10-CM

## 2023-11-11 DIAGNOSIS — L089 Local infection of the skin and subcutaneous tissue, unspecified: Secondary | ICD-10-CM | POA: Diagnosis not present

## 2023-11-11 DIAGNOSIS — A419 Sepsis, unspecified organism: Secondary | ICD-10-CM | POA: Diagnosis not present

## 2023-11-11 LAB — CBC
HCT: 30.1 % — ABNORMAL LOW (ref 39.0–52.0)
Hemoglobin: 9.6 g/dL — ABNORMAL LOW (ref 13.0–17.0)
MCH: 30.1 pg (ref 26.0–34.0)
MCHC: 31.9 g/dL (ref 30.0–36.0)
MCV: 94.4 fL (ref 80.0–100.0)
Platelets: 144 10*3/uL — ABNORMAL LOW (ref 150–400)
RBC: 3.19 MIL/uL — ABNORMAL LOW (ref 4.22–5.81)
RDW: 14.8 % (ref 11.5–15.5)
WBC: 6.3 10*3/uL (ref 4.0–10.5)
nRBC: 0 % (ref 0.0–0.2)

## 2023-11-11 LAB — BASIC METABOLIC PANEL WITH GFR
Anion gap: 7 (ref 5–15)
BUN: 14 mg/dL (ref 8–23)
CO2: 19 mmol/L — ABNORMAL LOW (ref 22–32)
Calcium: 7.6 mg/dL — ABNORMAL LOW (ref 8.9–10.3)
Chloride: 107 mmol/L (ref 98–111)
Creatinine, Ser: 0.73 mg/dL (ref 0.61–1.24)
GFR, Estimated: >60 mL/min (ref >60)
Glucose, Bld: 103 mg/dL — ABNORMAL HIGH (ref 70–99)
Potassium: 4 mmol/L (ref 3.5–5.1)
Sodium: 133 mmol/L — ABNORMAL LOW (ref 135–145)

## 2023-11-11 LAB — PROCALCITONIN: Procalcitonin: 0.69 ng/mL

## 2023-11-11 MED ORDER — POLYETHYLENE GLYCOL 3350 17 G PO PACK
17.0000 g | PACK | Freq: Every day | ORAL | Status: DC
  Administered 2023-11-11 – 2023-11-16 (×5): 17 g via ORAL
  Filled 2023-11-11 (×5): qty 1

## 2023-11-11 MED ORDER — POLYETHYLENE GLYCOL 3350 17 G PO PACK
17.0000 g | PACK | Freq: Once | ORAL | Status: AC
  Administered 2023-11-11: 17 g via ORAL
  Filled 2023-11-11: qty 1

## 2023-11-11 MED ORDER — VANCOMYCIN HCL 1250 MG/250ML IV SOLN
1250.0000 mg | Freq: Two times a day (BID) | INTRAVENOUS | Status: DC
  Administered 2023-11-11 – 2023-11-15 (×9): 1250 mg via INTRAVENOUS
  Filled 2023-11-11 (×9): qty 250

## 2023-11-11 MED ORDER — DOCUSATE SODIUM 100 MG PO CAPS
100.0000 mg | ORAL_CAPSULE | Freq: Once | ORAL | Status: AC
  Administered 2023-11-11: 100 mg via ORAL
  Filled 2023-11-11: qty 1

## 2023-11-11 MED ORDER — ENSURE PLUS HIGH PROTEIN PO LIQD
237.0000 mL | Freq: Two times a day (BID) | ORAL | Status: DC
  Administered 2023-11-11 – 2023-11-16 (×7): 237 mL via ORAL

## 2023-11-11 MED ORDER — BUTALBITAL-APAP-CAFFEINE 50-325-40 MG PO TABS
1.0000 | ORAL_TABLET | Freq: Four times a day (QID) | ORAL | Status: DC | PRN
  Administered 2023-11-11 (×2): 1 via ORAL
  Filled 2023-11-11 (×2): qty 1

## 2023-11-11 MED ORDER — DOCUSATE SODIUM 100 MG PO CAPS
100.0000 mg | ORAL_CAPSULE | Freq: Two times a day (BID) | ORAL | Status: DC
  Administered 2023-11-11 – 2023-11-16 (×10): 100 mg via ORAL
  Filled 2023-11-11 (×10): qty 1

## 2023-11-11 MED ORDER — BISACODYL 5 MG PO TBEC
10.0000 mg | DELAYED_RELEASE_TABLET | Freq: Once | ORAL | Status: AC
  Administered 2023-11-11: 10 mg via ORAL
  Filled 2023-11-11: qty 2

## 2023-11-11 MED ORDER — ENOXAPARIN SODIUM 100 MG/ML IJ SOSY
90.0000 mg | PREFILLED_SYRINGE | Freq: Two times a day (BID) | INTRAMUSCULAR | Status: AC
  Administered 2023-11-11 – 2023-11-13 (×5): 90 mg via SUBCUTANEOUS
  Filled 2023-11-11 (×5): qty 0.9

## 2023-11-11 NOTE — Consult Note (Signed)
 WOC Nurse Consult Note: Consult requested for left wrist. Secure chat message sent to the bedside nurse requesting a photo of the affected area.   Please note that the [facility] nursing team is utilizing a standardized work plan to manage patient consults. We are triaging consults and will try to see the patients within 48 hours. Wound photos in the patient's chart allow us  to consult on the patient in the most efficient and timely manner.   Thank-you,  Wiliam Harder MSN, RN, CWOCN, [place], CNS [phone]

## 2023-11-11 NOTE — Consult Note (Signed)
 WOC Nurse Consult Note: L wrist skin tear seen at [facility] 11/06/2023 and ordered Xeroform  Reason for Consult: L wrist wound  Wound type: full thickness skin tear  Pressure Injury POA: NA, not pressure related  Measurement: see nursing flowsheet  Wound bed: red moist; dark skin flap  Drainage (amount, consistency, odor) appears serosanguinous  Periwound: ecchymosis  Dressing procedure/placement/frequency: Cleanse L arm/wrist skin tear with NS, apply Xeroform gauze (Lawson [id number]) to wound bed every other day and secure with silicone foam or Kerlix roll gauze whichever is preferred.   POC discussed with bedside nurse. WOC team will not follow. Re-consult if further needs arise.   Thank you,    Ronni Colace MSN, RN-BC, [employer]

## 2023-11-11 NOTE — Progress Notes (Signed)
 PHARMACY - ANTICOAGULATION CONSULT NOTE  Pharmacy Consult for apixaban >>lovenox  Indication: atrial fibrillation  Allergies  Allergen Reactions   Levaquin  [Levofloxacin ] Itching    Tolerated ciprofloxacin  in 2022.    Patient Measurements: Height: 6' 2 (188 cm) Weight: 93 kg (205 lb) IBW/kg (Calculated) : 82.2 HEPARIN  DW (KG): 93  Vital Signs: Temp: 97.6 F (36.4 C) (06/17 0000) Temp Source: Oral (06/16 2036) BP: 120/65 (06/17 0000) Pulse Rate: 93 (06/17 0500)  Labs: Recent Labs    11/09/23 0753 11/10/23 0551 11/11/23 0513  HGB 10.1* 9.5* 9.6*  HCT 32.0* 30.0* 30.1*  PLT 144* 129* 144*  CREATININE 0.78 0.81 0.73    Estimated Creatinine Clearance: 91.3 mL/min (by C-G formula based on SCr of 0.73 mg/dL).   Medical History: Past Medical History:  Diagnosis Date   Agent orange exposure 1970's   takes Imdur ,Diltiazem , and Atacand  daily   Agent orange exposure    ALLERGIC RHINITIS    Allergy     seasonal allergies   Anemia    iron  deficiency   Arthritis    Asthma    uses inhaler and nebulizers   Atrial fibrillation (HCC)    Bruises easily    d/t prednisone  daily   Cataract    Cellulitis 03/22/2020   Chest wall pain 09/21/2019   L scapula, reported 4/27 21/     CHF (congestive heart failure) (HCC)    dx-on meds to treat   Chronic bronchitis (HCC)    used to get it q yr; last time ?2008 (04/28/2012)   Clotting disorder (HCC) 2019   On blood thinner when I bleed it is for a consideral amount of time. This is tegardless of size of wound.   COPD (chronic obstructive pulmonary disease) (HCC)    agent orange exposure   Degenerative disk disease    everywhere (04/28/2012)   Diverticulitis    Dysrhythmia    afib   Elevated uric acid in blood    takes Allopurinol  daily   Emphysema of lung (HCC)    Enlarged prostate    but not on any meds   GERD (gastroesophageal reflux disease)    takes Protonix  daily   H/O hiatal hernia    Headache    History of  colonoscopy    History of pneumonia    a. 2010   Hyperlipidemia    takes Lipitor daily; pt. states he takes a preventive   Hypothyroidism    Joint pain    Joint swelling    Leukocytosis    MSSA bacteremia 03/19/2023   Nausea and vomiting 11/12/2022   Neuromuscular disorder (HCC)    bilat legs, and bilat arms   Neuropathy    Osteoporosis 2012   PAF (paroxysmal atrial fibrillation) (HCC)    a. Dx 04/2012, CHA2DS2VASc = 1 (age);  b. 04/2012 Echo: EF 40-50%, mild MR.   Personal history of colonic adenomas 02/09/2013   Pneumonia    Sepsis (HCC) 03/18/2023   Shortness of breath dyspnea    Thyroid  disease    no taking meds-caused dizziness    Medications:  Medications Prior to Admission  Medication Sig Dispense Refill Last Dose/Taking   albuterol  (VENTOLIN  HFA) 108 (90 Base) MCG/ACT inhaler INHALE 2 PUFFS BY MOUTH EVERY 4 HOURS AS NEEDED FOR WHEEZE OR FOR SHORTNESS OF BREATH (Patient taking differently: Inhale 1-2 puffs into the lungs every 4 (four) hours as needed.) 8.5 each 12 11/06/2023   allopurinol  (ZYLOPRIM ) 100 MG tablet TAKE 1 TABLET (100 MG TOTAL) BY MOUTH IN  THE MORNING 90 tablet 3 11/07/2023 Morning   atorvastatin  (LIPITOR) 20 MG tablet TAKE 1 TABLET BY MOUTH EVERY DAY 90 tablet 0 11/07/2023 Morning   ciprofloxacin  (CIPRO ) 500 MG tablet Take 500 mg by mouth 2 (two) times daily.   11/07/2023 Evening   cyclobenzaprine  (FLEXERIL ) 10 MG tablet Take 1 tablet (10 mg total) by mouth 3 (three) times daily as needed for muscle spasms. 90 tablet 3 Past Week   diclofenac  Sodium (VOLTAREN ) 1 % GEL Apply 4 g topically as needed (joint pain).   Unknown   diltiazem  (CARDIZEM  CD) 180 MG 24 hr capsule Take 1 capsule (180 mg total) by mouth daily. 90 capsule 3 11/07/2023 Morning   diltiazem  (CARDIZEM ) 30 MG tablet TAKE 1 TABLET (30 MG TOTAL) BY MOUTH AS NEEDED (HEART RATE OVER 120). 90 tablet 1 Unknown   ELIQUIS  5 MG TABS tablet TAKE 1 TABLET BY MOUTH TWICE A DAY 180 tablet 1 11/07/2023 at  5:00 PM    ferrous gluconate  (FERGON) 324 MG tablet Take 1 tablet (324 mg total) by mouth daily. 90 tablet 2 11/07/2023 Morning   furosemide  (LASIX ) 40 MG tablet Take 1 tablet (40 mg total) by mouth daily. (Patient taking differently: Take 40-80 mg by mouth daily as needed for edema.) 30 tablet 3 11/07/2023 Morning   ipratropium-albuterol  (DUONEB) 0.5-2.5 (3) MG/3ML SOLN 1 NEBULE EVERY 6 HOURS AS NEEDED. **J45.3** (Patient taking differently: Take 3 mLs by nebulization every 6 (six) hours as needed.) 270 mL 4 Past Month   isosorbide  mononitrate (IMDUR ) 30 MG 24 hr tablet TAKE 1 TABLET BY MOUTH EVERY DAY 90 tablet 0 11/07/2023 Morning   meclizine  (ANTIVERT ) 25 MG tablet TAKE 1 TABLET BY MOUTH 3 TIMES DAILY AS NEEDED FOR DIZZINESS. 30 tablet 0 Past Week   Olodaterol HCl (STRIVERDI RESPIMAT) 2.5 MCG/ACT AERS INHALE 2 PUFFS BY MOUTH INTO THE LUNGS DAILY (Patient taking differently: Inhale 2 puffs into the lungs 2 (two) times daily.) 4 g 11 11/07/2023 Evening   ondansetron  (ZOFRAN -ODT) 8 MG disintegrating tablet Take 1 tablet (8 mg total) by mouth every 8 (eight) hours as needed for nausea. 20 tablet 3 11/07/2023 Noon   oxyCODONE -acetaminophen  (PERCOCET) 5-325 MG tablet Take 1 tablet by mouth every 4 (four) hours as needed for severe pain (pain score 7-10). 30 tablet 0 Past Week   pantoprazole  (PROTONIX ) 40 MG tablet TAKE 1 TABLET (40 MG TOTAL) BY MOUTH TWICE A DAY BEFORE MEALS 180 tablet 2 11/07/2023 Evening   predniSONE  (DELTASONE ) 10 MG tablet Take 20 mg by mouth daily with breakfast.   11/07/2023 Morning   pregabalin  (LYRICA ) 100 MG capsule Take 1 capsule (100 mg total) by mouth 2 (two) times daily. 60 capsule 2 11/07/2023 Evening   tamsulosin  (FLOMAX ) 0.4 MG CAPS capsule Take 0.4 mg by mouth at bedtime.   11/07/2023 Bedtime   Scheduled:   apixaban   5 mg Oral BID   arformoterol   15 mcg Nebulization BID   atorvastatin   20 mg Oral Daily   bisacodyl   10 mg Oral Once   budesonide  (PULMICORT ) nebulizer solution  0.25 mg  Nebulization BID   cyanocobalamin   1,000 mcg Subcutaneous Daily   Followed by   Cecily Cohen ON 11/17/2023] cyanocobalamin   1,000 mcg Subcutaneous Weekly   diclofenac  Sodium  4 g Topical QID   diltiazem   180 mg Oral Daily   docusate sodium   100 mg Oral BID   pantoprazole   40 mg Oral BID   polyethylene glycol  17 g Oral Daily  polyethylene glycol  17 g Oral Once   pregabalin   100 mg Oral BID   tamsulosin   0.4 mg Oral QHS   Infusions:   ceFEPime  (MAXIPIME ) IV 2 g (11/11/23 0559)   vancomycin  1,250 mg (11/10/23 2212)    Assessment: Pt has been on apixaban  for his AF. Plan for BKA on Friday. Ok to bridge with lovenox  until Thursday AM.   Hgb 9.6, plt 144k Scr <1  Goal of Therapy:  Anti-Xa level 0.6-1 units/ml 4hrs after LMWH dose given Monitor platelets by anticoagulation protocol: Yes   Plan:  Lovenox  90mg  SQ BID until Thursday AM F/u post surgery  Ivery Marking, PharmD, BCIDP, AAHIVP, CPP Infectious Disease Pharmacist 11/11/2023 8:36 AM

## 2023-11-11 NOTE — H&P (View-Only) (Signed)
 ORTHOPAEDIC CONSULTATION  REQUESTING PHYSICIAN: Elgergawy, Ardia Kraft, MD  Chief Complaint: Ulceration and drainage right hindfoot.  HPI: Levi [name] is a 78 y.o. male who presents with sepsis and draining ulcer right foot.  Patient states he has undergone surgical debridement for foot salvage intervention of the right foot.  Past Medical History:  Diagnosis Date   Agent orange exposure 1970's   takes Imdur ,Diltiazem , and Atacand  daily   Agent orange exposure    ALLERGIC RHINITIS    Allergy     seasonal allergies   Anemia    iron  deficiency   Arthritis    Asthma    uses inhaler and nebulizers   Atrial fibrillation (HCC)    Bruises easily    d/t prednisone  daily   Cataract    Cellulitis 03/22/2020   Chest wall pain 09/21/2019   L scapula, reported 4/27 21/     CHF (congestive heart failure) (HCC)    dx-on meds to treat   Chronic bronchitis (HCC)    used to get it q yr; last time ?2008 (04/28/2012)   Clotting disorder (HCC) 2019   On blood thinner when I bleed it is for a consideral amount of time. This is tegardless of size of wound.   COPD (chronic obstructive pulmonary disease) (HCC)    agent orange exposure   Degenerative disk disease    everywhere (04/28/2012)   Diverticulitis    Dysrhythmia    afib   Elevated uric acid in blood    takes Allopurinol  daily   Emphysema of lung (HCC)    Enlarged prostate    but not on any meds   GERD (gastroesophageal reflux disease)    takes Protonix  daily   H/O hiatal hernia    Headache    History of colonoscopy    History of pneumonia    a. 2010   Hyperlipidemia    takes Lipitor daily; pt. states he takes a preventive   Hypothyroidism    Joint pain    Joint swelling    Leukocytosis    MSSA bacteremia 03/19/2023   Nausea and vomiting 11/12/2022   Neuromuscular disorder (HCC)    bilat legs, and bilat arms   Neuropathy    Osteoporosis 2012   PAF (paroxysmal atrial fibrillation) (HCC)    a. Dx 04/2012,  CHA2DS2VASc = 1 (age);  b. 04/2012 Echo: EF 40-50%, mild MR.   Personal history of colonic adenomas 02/09/2013   Pneumonia    Sepsis (HCC) 03/18/2023   Shortness of breath dyspnea    Thyroid  disease    no taking meds-caused dizziness   Past Surgical History:  Procedure Laterality Date   55 HOUR PH STUDY N/A 05/30/2021   Procedure: 24 HOUR PH STUDY;  Surgeon: Annis Kinder, DO;  Location: [facility];  Service: Gastroenterology;  Laterality: N/A;   AMPUTATION TOE Bilateral 08/13/2023   Procedure: LEFT SECOND TOE AMPUTATION AND RIGHT FIFTH TOE AMPUTATION;  Surgeon: Evertt Hoe, DPM;  Location: [facility];  Service: Orthopedics/Podiatry;  Laterality: Bilateral;   ANTERIOR CERVICAL DECOMP/DISCECTOMY FUSION N/A 07/21/2012   Procedure: ANTERIOR CERVICAL DECOMPRESSION/DISCECTOMY FUSION 2 LEVELS;  Surgeon: Elna Haggis, MD;  Location: [facility];  Service: Neurosurgery;  Laterality: N/A;  C4-5 C5-6 Anterior cervical decompression/diskectomy/fusion   ANTERIOR LAT LUMBAR FUSION N/A 04/07/2017   Procedure: Thoracic twelve-Lumbar one Anterolateral decompression/fusion;  Surgeon: Elna Haggis, MD;  Location: [facility];  Service: Neurosurgery;  Laterality: N/A;   ARTHRODESIS FOOT WITH WEIL OSTEOTOMY Left 11/07/2022  Procedure: LEFT THIRD METATARSOPHALANGEAL CAPSULOTOMY, WEIL OSTEOTOMY;  Surgeon: Donnamarie Gables, MD;  Location: [facility] OR;  Service: Orthopedics;  Laterality: Left;  LENGTH OF SURGERY: 60 MINUTES   BACK SURGERY     x 3   BONE EXOSTOSIS EXCISION Right 10/16/2023   Procedure: EXCISION, EXOSTOSIS;  Surgeon: Dot Gazella, DPM;  Location: [facility];  Service: Orthopedics/Podiatry;  Laterality: Right;   CARDIAC CATHETERIZATION  11/2008   Dr. Renna Cary - 20% calcified non flow limiting left main, 50% EF apical hypokinesis   CARPAL TUNNEL RELEASE Bilateral    CERVICAL DISC ARTHROPLASTY N/A 04/07/2017   Procedure: Cervical six-seven Disc arthroplasty;  Surgeon: Elna Haggis, MD;  Location:  [facility] OR;  Service: Neurosurgery;  Laterality: N/A;   CHEST TUBE INSERTION Left 04/11/2017   Procedure: CHEST TUBE INSERTION;  Surgeon: Heriberto London, MD;  Location: [facility] OR;  Service: Thoracic;  Laterality: Left;   CHOLECYSTECTOMY     COLONOSCOPY  2016   CG-MAC-miralax (good)servere TICS/TA x 2   ESOPHAGEAL MANOMETRY N/A 05/30/2021   Procedure: ESOPHAGEAL MANOMETRY (EM);  Surgeon: Annis Kinder, DO;  [facility]: [facility];  Service: Gastroenterology;  Laterality: N/A;  ph impedence   ESOPHAGOGASTRODUODENOSCOPY     EYE SURGERY Bilateral 2011   steroidal encapsulation (04/28/2012)   FOOT SURGERY Right    x 2   JOINT REPLACEMENT     Many surgeries including 5 joit replacements   KNEE ARTHROSCOPY Bilateral    LATERAL / POSTERIOR COMBINED FUSION LUMBAR SPINE  2012   LEFT ATRIAL APPENDAGE OCCLUSION N/A 02/22/2021   Procedure: LEFT ATRIAL APPENDAGE OCCLUSION;  Surgeon: Boyce Byes, MD;  [facility];  Service: Cardiovascular;  Laterality: N/A;   NEUROPLASTY / TRANSPOSITION ULNAR NERVE AT ELBOW Bilateral    POLYPECTOMY  2016   severe TICS/TA x 2   POSTERIOR CERVICAL LAMINECTOMY WITH MET- RX Right 06/11/2021   Procedure: Right Cervical six-seven  Laminectomy and foraminotomy with metrex;  Surgeon: Elna Haggis, MD;  [facility];  Service: Neurosurgery;  Laterality: Right;   REFRACTIVE SURGERY Left 07/07/2023   laser surgery but still has floaters   REVERSE SHOULDER ARTHROPLASTY  04/28/2012   Procedure: REVERSE SHOULDER ARTHROPLASTY;  Surgeon: Derald Flattery, MD;  Location: [facility] OR;  Service: Orthopedics;  Laterality: Left;  Left shouder reverse total shoulder arthroplasty   SHOULDER SURGERY     SPINE SURGERY     I have had 7 spinal surgeries   TEE WITHOUT CARDIOVERSION N/A 02/22/2021   Procedure: TRANSESOPHAGEAL ECHOCARDIOGRAM (TEE);  Surgeon: Boyce Byes, MD;  Location: [facility] INVASIVE CV LAB;  Service: Cardiovascular;  Laterality: N/A;   TENDON  REPAIR Right 08/07/2020   Procedure: RIGHT HAND LIGAMENT RECONSTRUCTION AND TENDON INTERPOSITION;  Surgeon: Neil Balls, MD;  Location: [facility];  Service: Orthopedics;  Laterality: Right;   TONSILLECTOMY AND ADENOIDECTOMY  1954   TOTAL HIP ARTHROPLASTY Left 2011   left (04/28/2012)   TOTAL HIP ARTHROPLASTY Right 07/31/2015   Procedure: TOTAL HIP ARTHROPLASTY ANTERIOR APPROACH;  Surgeon: Neil Balls, MD;  [facility];  Service: Orthopedics;  Laterality: Right;   TOTAL KNEE ARTHROPLASTY  01/10/2012   Procedure: TOTAL KNEE ARTHROPLASTY;  Surgeon: Boston Byers, MD;  [facility];  Service: Orthopedics;;  left total knee arthroplasty   TOTAL KNEE ARTHROPLASTY Right 06/17/2014   Procedure: TOTAL KNEE ARTHROPLASTY;  Surgeon: Boston Byers, MD;  [facility];  Service: Orthopedics;  Laterality: Right;   toupet fundolplication  11/01/2021  TRANSESOPHAGEAL ECHOCARDIOGRAM (CATH LAB) N/A 03/24/2023   Procedure: TRANSESOPHAGEAL ECHOCARDIOGRAM;  Surgeon: Hazle Lites, MD;  Location: [facility] INVASIVE CV LAB;  Service: Cardiovascular;  Laterality: N/A;   Social History   Socioeconomic History   Marital status: Married    Spouse name: Richad Champagne   Number of children: 1   Years of education: 16   Highest education level: Bachelor's degree (e.g., BA, AB, BS)  Occupational History   Occupation: Retired    Comment: Nurse, 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 Daughter lives two miles away. 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 Enjoys being active and working.       Tobacco use, amount per day now: None   Past tobacco use, amount per day: None   How many years did you use tobacco:   Alcohol use (drinks per week): None   Diet:   Do you drink/eat things with caffeine: Coffee   Marital status:  Married                                What year were you married? 1973   Do you live in a house, apartment, assisted living, condo, trailer, etc.? House    Is it one or more stories? 3 stories   How many persons live in your home? 2   Do you have pets in your home?( please list) 3 [employer]    Highest Level of education completed? Engineer, maintenance (IT).   Current or past profession: Police    Do you exercise?    Yes                              Type and how often? 5 times weekly   Do you have a living will? Yes   Do you have a DNR form?     Yes                              If not, do you want to discuss one?   Do you have signed POA/HPOA forms?    Yes                    If so, please bring to you appointment      Do you have any difficulty bathing or dressing yourself?    Do you have any difficulty preparing food or eating? No   Do you have any difficulty managing your medications? No   Do you have any difficulty managing your finances? No   Do you have any difficulty affording your medications? No   Social Drivers of Corporate investment banker Strain: Low Risk  (10/23/2023)   Overall Financial Resource Strain (CARDIA)    Difficulty of Paying Living Expenses: Not hard at all  Food Insecurity: No Food Insecurity (11/08/2023)   Hunger Vital Sign  Worried About Programme researcher, broadcasting/film/video in the Last Year: Never true    Ran Out of Food in the Last Year: Never true  Transportation Needs: No Transportation Needs (11/08/2023)   PRAPARE - Administrator, Civil Service (Medical): No    Lack of Transportation (Non-Medical): No  Physical Activity: Sufficiently Active (10/23/2023)   Exercise Vital Sign    Days of Exercise per Week: 5 days    Minutes of  Exercise per Session: 60 min  Stress: No Stress Concern Present (10/23/2023)   [employer] of Occupational Health - Occupational Stress Questionnaire    Feeling of Stress : Not at all  Social Connections: Socially Integrated (11/08/2023)   Social Connection and Isolation Panel    Frequency of Communication with Friends and Family: More than three times a week    Frequency of Social Gatherings with Friends and Family: More than three times a week    Attends Religious Services: More than 4 times per year    Active Member of [employer] or Organizations: Yes    Attends Engineer, structural: More than 4 times per year    Marital Status: Married   Family History  Problem Relation Age of Onset   Diabetes Mother    Heart disease Father        Died 22, MI   Cancer Father    Hyperlipidemia Father    Stroke Father    Prostate cancer Brother    Colon polyps Brother    Colon polyps Brother    Asthma Brother    COPD Brother    Colon cancer Brother        Dx age 46   Cancer Brother    Colon cancer Other    Esophageal cancer Neg Hx    Rectal cancer Neg Hx    Stomach cancer Neg Hx    Pancreatic cancer Neg Hx    Kidney disease Neg Hx    Liver disease Neg Hx    Neuropathy Neg Hx    - negative except otherwise stated in the family history section Allergies  Allergen Reactions   Levaquin  [Levofloxacin ] Itching    Tolerated ciprofloxacin  in 2022.   Prior to Admission medications   Medication Sig Start Date End Date Taking? Authorizing Provider  albuterol  (VENTOLIN  HFA) 108 (90 Base) MCG/ACT inhaler INHALE 2 PUFFS BY MOUTH EVERY 4 HOURS AS NEEDED FOR WHEEZE OR FOR SHORTNESS OF BREATH Patient taking differently: Inhale 1-2 puffs into the lungs every 4 (four) hours as needed. 01/14/23  Yes Young, Rupert Counts D, MD  allopurinol  (ZYLOPRIM ) 100 MG tablet TAKE 1 TABLET (100 MG TOTAL) BY MOUTH IN THE MORNING 09/02/23  Yes Gean Keels, MD  atorvastatin  (LIPITOR) 20 MG tablet TAKE 1  TABLET BY MOUTH EVERY DAY 09/02/23  Yes Hugh Madura, MD  ciprofloxacin  (CIPRO ) 500 MG tablet Take 500 mg by mouth 2 (two) times daily.   Yes [provider]  cyclobenzaprine  (FLEXERIL ) 10 MG tablet Take 1 tablet (10 mg total) by mouth 3 (three) times daily as needed for muscle spasms. 09/04/23  Yes Gean Keels, MD  diclofenac  Sodium (VOLTAREN ) 1 % GEL Apply 4 g topically as needed (joint pain).   Yes [provider]  diltiazem  (CARDIZEM  CD) 180 MG 24 hr capsule Take 1 capsule (180 mg total) by mouth daily. 08/25/23  Yes Hugh Madura, MD  diltiazem  (CARDIZEM ) 30 MG tablet TAKE 1 TABLET (30 MG TOTAL) BY MOUTH AS NEEDED (HEART  RATE OVER 120). 09/02/23  Yes Hugh Madura, MD  ELIQUIS  5 MG TABS tablet TAKE 1 TABLET BY MOUTH TWICE A DAY 07/28/23  Yes Hugh Madura, MD  ferrous gluconate  (FERGON) 324 MG tablet Take 1 tablet (324 mg total) by mouth daily. 06/18/23  Yes Tye Gall, MD  furosemide  (LASIX ) 40 MG tablet Take 1 tablet (40 mg total) by mouth daily. Patient taking differently: Take 40-80 mg by mouth daily as needed for edema. 05/27/23  Yes Tye Gall, MD  ipratropium-albuterol  (DUONEB) 0.5-2.5 (3) MG/3ML SOLN 1 NEBULE EVERY 6 HOURS AS NEEDED. **J45.3** Patient taking differently: Take 3 mLs by nebulization every 6 (six) hours as needed. 03/22/20  Yes Young, Rupert Counts D, MD  isosorbide  mononitrate (IMDUR ) 30 MG 24 hr tablet TAKE 1 TABLET BY MOUTH EVERY DAY 09/02/23  Yes Hugh Madura, MD  meclizine  (ANTIVERT ) 25 MG tablet TAKE 1 TABLET BY MOUTH 3 TIMES DAILY AS NEEDED FOR DIZZINESS. 07/03/23  Yes Gean Keels, MD  Olodaterol HCl (STRIVERDI RESPIMAT) 2.5 MCG/ACT AERS INHALE 2 PUFFS BY MOUTH INTO THE LUNGS DAILY Patient taking differently: Inhale 2 puffs into the lungs 2 (two) times daily. 03/10/23  Yes Young, Rupert Counts D, MD  ondansetron  (ZOFRAN -ODT) 8 MG disintegrating tablet Take 1 tablet (8 mg total) by mouth every 8 (eight) hours as needed for  nausea. 04/07/23  Yes Gean Keels, MD  oxyCODONE -acetaminophen  (PERCOCET) 5-325 MG tablet Take 1 tablet by mouth every 4 (four) hours as needed for severe pain (pain score 7-10). 10/16/23  Yes Dot Gazella, DPM  pantoprazole  (PROTONIX ) 40 MG tablet TAKE 1 TABLET (40 MG TOTAL) BY MOUTH TWICE A DAY BEFORE MEALS 08/14/23  Yes Gean Keels, MD  predniSONE  (DELTASONE ) 10 MG tablet Take 20 mg by mouth daily with breakfast. 07/05/23  Yes [provider]  pregabalin  (LYRICA ) 100 MG capsule Take 1 capsule (100 mg total) by mouth 2 (two) times daily. 06/17/23  Yes Tye Gall, MD  tamsulosin  (FLOMAX ) 0.4 MG CAPS capsule Take 0.4 mg by mouth at bedtime.   Yes [provider]   No results found. - pertinent xrays, CT, MRI studies were reviewed and independently interpreted  Positive ROS: All other systems have been reviewed and were otherwise negative with the exception of those mentioned in the HPI and as above.  Physical Exam: General: Alert, no acute distress Psychiatric: Patient is competent for consent with normal mood and affect Lymphatic: No axillary or cervical lymphadenopathy Cardiovascular: No pedal edema Respiratory: No cyanosis, no use of accessory musculature GI: No organomegaly, abdomen is soft and non-tender    Images:  @ENCIMAGES @  Labs:  Lab Results  Component Value Date   HGBA1C 5.8 (H) 07/05/2022   HGBA1C 5.5 10/21/2019   ESRSEDRATE 30 (H) 11/08/2023   ESRSEDRATE 11 10/08/2023   ESRSEDRATE 17 03/31/2023   CRP 21.0 (H) 11/10/2023   CRP 16.5 (H) 11/08/2023   CRP 11.7 (H) 03/31/2023   LABURIC 5.6 06/17/2023   LABURIC 6.4 07/05/2022   REPTSTATUS PENDING 11/08/2023   GRAMSTAIN NO WBC SEEN NO ORGANISMS SEEN  10/16/2023   CULT  11/08/2023    NO GROWTH 2 DAYS Performed at [facility] Lab, [address], [place], [place] [address]    [facility] STAPHYLOCOCCUS COHNII 10/16/2023    Lab Results  Component Value Date    ALBUMIN 2.9 (L) 11/08/2023   ALBUMIN 3.6 (L) 11/03/2023   ALBUMIN 3.6 (L) 10/08/2023   LABURIC 5.6 06/17/2023   LABURIC 6.4  07/05/2022        Latest Ref Rng & Units 11/11/2023    5:13 AM 11/10/2023    5:51 AM 11/09/2023    7:53 AM  CBC EXTENDED  WBC 4.0 - 10.5 K/uL 6.3  7.6  10.3   RBC 4.22 - 5.81 MIL/uL 3.19  3.11  3.33   Hemoglobin 13.0 - 17.0 g/dL 9.6  9.5  16.1   HCT 09.6 - 52.0 % 30.1  30.0  32.0   Platelets 150 - 400 K/uL 144  129  144     Neurologic: Patient does not have protective sensation bilateral lower extremities.   MUSCULOSKELETAL:   Skin: Examination patient has an ulcer beneath the base of the fifth metatarsal and cuboid.  Review of the MRI scan shows osteomyelitis involving the base of the fifth metatarsal and cuboid.  Ankle-brachial indices shows normal triphasic flow with adequate great toe pressure.  C-reactive protein 21.0 with hemoglobin 9.6 and white cell count of 6.3.  Hemoglobin A1c 5.8.  Assessment: Assessment: Osteomyelitis and ulceration right hindfoot with secondary sepsis.  Plan: Plan: Will continue the IV antibiotics to decrease the cellulitis in the right leg.  Will plan for a right below-knee amputation on Friday.  Risk and benefits and postoperative course was discussed the patient states he understands wished to proceed at this time.  Thank you for the consult and the opportunity to see Mr. Cailean Heacock, MD [facility] [phone] 8:13 AM

## 2023-11-11 NOTE — Progress Notes (Addendum)
 Physical Therapy Treatment Patient Details Name: Levi [name] MRN: [id number] DOB: June 27, 1945 Today's Date: 11/11/2023   History of Present Illness 78 yo male [facility]/14 with mechanical fall + head trauma. Workup for septic shock due to RLE cellulitis and possible osteomyelitis. CTH and CT Cspine negative for acute findings.  Podiatry consult 6/16 with planned BKA on Friday, June 20. PMHx:  admission 03/2023 for RLE wound and cellulitis, agent orange exposure, afib, neuropathy, osteoporosis, CHF, COPD, HLD and venous insufficiency of both lower extremities, PAF, L 2nd and 5th toe amputations 07/2023, recent RLE 5th ray resection, ACDF 2014 and 2023, lumbar fusion 2018, x5 joint replacements.    PT Comments  Pt admitted with above diagnosis. Pt was able to ambulate 15 with with min assist of 2 and had BM.  Pt needs min to mod assist +2 for tasks currently.  Noted planned right BKA on Fri 6/20.  Agree with post acute rehab > 3 hours day most likely as pt has good support at home. Will continue to follow acutely.  Pt currently with functional limitations due to the deficits listed below (see PT Problem List). Pt will benefit from acute skilled PT to increase their independence and safety with mobility to allow discharge.       If plan is discharge home, recommend the following: A lot of help with walking and/or transfers;A lot of help with bathing/dressing/bathroom;Assist for transportation;Help with stairs or ramp for entrance   Can travel by private vehicle        Equipment Recommendations  Wheelchair (measurements PT);Wheelchair cushion (measurements PT) (will need amputee wheelchair)    Recommendations for Other Services       Precautions / Restrictions Precautions Precautions: Fall Recall of Precautions/Restrictions: Intact Required Braces or Orthoses: Other Brace Other Brace: in post op shoe right foot Restrictions Weight Bearing Restrictions Per Provider Order: No RLE  Weight Bearing Per Provider Order: Weight bearing as tolerated Other Position/Activity Restrictions: post op shoe     Mobility  Bed Mobility Overal bed mobility: Needs Assistance Bed Mobility: Supine to Sit, Sit to Supine     Supine to sit: Mod assist, HOB elevated, +2 for safety/equipment     General bed mobility comments: assist for trunk rise, scooting towards EOB. Noted primo fit had been leaking therefore obtained wash clothes and cleaned pt and changed his gown.    Transfers Overall transfer level: Needs assistance Equipment used: Rolling walker (2 wheels) Transfers: Sit to/from Stand Sit to Stand: Mod assist, From elevated surface, +2 physical assistance           General transfer comment: posterior bias, visual demonstration for technique and requires mod assist +2 to fully power up for elevated EOB.    Ambulation/Gait Ambulation/Gait assistance: Min assist, +2 safety/equipment Gait Distance (Feet): 15 Feet (15 feet then 5 feet) Assistive device: Rolling walker (2 wheels) Gait Pattern/deviations: Step-through pattern, Decreased step length - right, Decreased step length - left, Decreased stride length, Decreased stance time - right, Decreased weight shift to right, Trunk flexed, Wide base of support, Drifts right/left, Leaning posteriorly   Gait velocity interpretation: <1.31 ft/sec, indicative of household ambulator   General Gait Details: Pt wanted to walk to bathroom to have BM.  Pt needed cues for sequencing steps and RW and assist with steeriing RW.  At times min assist for balance.  Pt with poor posture throughout and could not stand fully upright. Pt was successful in having BM.  Brought chair close  to the bathroom as pt was very fatigued and unsure he could have walked further than 5 feet.  Pt did agree to sit in chair.   Stairs             Wheelchair Mobility     Tilt Bed    Modified Rankin (Stroke Patients Only)       Balance Overall  balance assessment: Needs assistance, History of Falls Sitting-balance support: No upper extremity supported, Feet supported Sitting balance-[name] Scale: Fair     Standing balance support: During functional activity, No upper extremity supported, Bilateral upper extremity supported Standing balance-[name] Scale: Poor Standing balance comment: relies on UE support                            Communication Communication Communication: No apparent difficulties  Cognition Arousal: Alert Behavior During Therapy: WFL for tasks assessed/performed   PT - Cognitive impairments: No apparent impairments                         Following commands: Intact      Cueing Cueing Techniques: Verbal cues, Visual cues  Exercises General Exercises - Lower Extremity Ankle Circles/Pumps: AROM, Both, Supine, 10 reps Quad Sets: AROM, Both, Supine, 10 reps Long Arc Quad: AROM, Both, 10 reps, Seated    General Comments General comments (skin integrity, edema, etc.): Hr to 128 bpm with activity. Other VSS.  Nurse aware.      Pertinent Vitals/Pain Pain Assessment Pain Assessment: Faces Faces Pain Scale: Hurts little more Pain Location: R feel/forefoot, L heel Pain Descriptors / Indicators: Grimacing, Guarding, Discomfort, Sore Pain Intervention(s): Limited activity within patient's tolerance, Monitored during session, Repositioned    Home Living                          Prior Function            PT Goals (current goals can now be found in the care plan section) Acute Rehab PT Goals Patient Stated Goal: back to doing more for self, back to baseline mobility Progress towards PT goals: Progressing toward goals    Frequency    Min 3X/week      PT Plan      Co-evaluation              AM-PAC PT 6 Clicks Mobility   Outcome Measure  Help needed turning from your back to your side while in a flat bed without using bedrails?: A Little Help needed  moving from lying on your back to sitting on the side of a flat bed without using bedrails?: A Lot Help needed moving to and from a bed to a chair (including a wheelchair)?: A Lot Help needed standing up from a chair using your arms (e.g., wheelchair or bedside chair)?: Total Help needed to walk in hospital room?: Total Help needed climbing 3-5 steps with a railing? : Total 6 Click Score: 10    End of Session Equipment Utilized During Treatment: Gait belt Activity Tolerance: Patient tolerated treatment well;Patient limited by fatigue Patient left: with call bell/phone within reach;in chair;with chair alarm set Nurse Communication: Mobility status (Incr HR) PT Visit Diagnosis: Other abnormalities of gait and mobility (R26.89);Muscle weakness (generalized) (M62.81)     Time: 1000-1048 PT Time Calculation (min) (ACUTE ONLY): 48 min  Charges:    $Gait Training: 8-22 mins $Therapeutic Exercise: 8-22 mins $Self  Care/Home Management: 8-22 PT General Charges $$ ACUTE PT VISIT: 1 Visit                     [facility] M,PT Acute Rehab Services [phone]    Florencia Hunter 11/11/2023, 12:26 PM

## 2023-11-11 NOTE — Care Management Important Message (Signed)
 Important Message  Patient Details  Name: Levi [name] MRN: [id number] Date of Birth: 1945-11-14   Important Message Given:  Yes - Medicare IM     Wynonia Hedges 11/11/2023, 3:33 PM

## 2023-11-11 NOTE — Plan of Care (Signed)

## 2023-11-11 NOTE — Consult Note (Signed)
 ORTHOPAEDIC CONSULTATION  REQUESTING PHYSICIAN: Elgergawy, Ardia Kraft, MD  Chief Complaint: Ulceration and drainage right hindfoot.  HPI: Levi [name] is a 78 y.o. male who presents with sepsis and draining ulcer right foot.  Patient states he has undergone surgical debridement for foot salvage intervention of the right foot.  Past Medical History:  Diagnosis Date   Agent orange exposure 1970's   takes Imdur ,Diltiazem , and Atacand  daily   Agent orange exposure    ALLERGIC RHINITIS    Allergy     seasonal allergies   Anemia    iron  deficiency   Arthritis    Asthma    uses inhaler and nebulizers   Atrial fibrillation (HCC)    Bruises easily    d/t prednisone  daily   Cataract    Cellulitis 03/22/2020   Chest wall pain 09/21/2019   L scapula, reported 4/27 21/     CHF (congestive heart failure) (HCC)    dx-on meds to treat   Chronic bronchitis (HCC)    used to get it q yr; last time ?2008 (04/28/2012)   Clotting disorder (HCC) 2019   On blood thinner when I bleed it is for a consideral amount of time. This is tegardless of size of wound.   COPD (chronic obstructive pulmonary disease) (HCC)    agent orange exposure   Degenerative disk disease    everywhere (04/28/2012)   Diverticulitis    Dysrhythmia    afib   Elevated uric acid in blood    takes Allopurinol  daily   Emphysema of lung (HCC)    Enlarged prostate    but not on any meds   GERD (gastroesophageal reflux disease)    takes Protonix  daily   H/O hiatal hernia    Headache    History of colonoscopy    History of pneumonia    a. 2010   Hyperlipidemia    takes Lipitor daily; pt. states he takes a preventive   Hypothyroidism    Joint pain    Joint swelling    Leukocytosis    MSSA bacteremia 03/19/2023   Nausea and vomiting 11/12/2022   Neuromuscular disorder (HCC)    bilat legs, and bilat arms   Neuropathy    Osteoporosis 2012   PAF (paroxysmal atrial fibrillation) (HCC)    a. Dx 04/2012,  CHA2DS2VASc = 1 (age);  b. 04/2012 Echo: EF 40-50%, mild MR.   Personal history of colonic adenomas 02/09/2013   Pneumonia    Sepsis (HCC) 03/18/2023   Shortness of breath dyspnea    Thyroid  disease    no taking meds-caused dizziness   Past Surgical History:  Procedure Laterality Date   82 HOUR PH STUDY N/A 05/30/2021   Procedure: 24 HOUR PH STUDY;  Surgeon: Annis Kinder, DO;  Location: [facility];  Service: Gastroenterology;  Laterality: N/A;   AMPUTATION TOE Bilateral 08/13/2023   Procedure: LEFT SECOND TOE AMPUTATION AND RIGHT FIFTH TOE AMPUTATION;  Surgeon: Evertt Hoe, DPM;  Location: [facility];  Service: Orthopedics/Podiatry;  Laterality: Bilateral;   ANTERIOR CERVICAL DECOMP/DISCECTOMY FUSION N/A 07/21/2012   Procedure: ANTERIOR CERVICAL DECOMPRESSION/DISCECTOMY FUSION 2 LEVELS;  Surgeon: Elna Haggis, MD;  Location: [facility];  Service: Neurosurgery;  Laterality: N/A;  C4-5 C5-6 Anterior cervical decompression/diskectomy/fusion   ANTERIOR LAT LUMBAR FUSION N/A 04/07/2017   Procedure: Thoracic twelve-Lumbar one Anterolateral decompression/fusion;  Surgeon: Elna Haggis, MD;  Location: [facility];  Service: Neurosurgery;  Laterality: N/A;   ARTHRODESIS FOOT WITH WEIL OSTEOTOMY Left 11/07/2022  Procedure: LEFT THIRD METATARSOPHALANGEAL CAPSULOTOMY, WEIL OSTEOTOMY;  Surgeon: Donnamarie Gables, MD;  Location: [facility] OR;  Service: Orthopedics;  Laterality: Left;  LENGTH OF SURGERY: 60 MINUTES   BACK SURGERY     x 3   BONE EXOSTOSIS EXCISION Right 10/16/2023   Procedure: EXCISION, EXOSTOSIS;  Surgeon: Dot Gazella, DPM;  Location: [facility];  Service: Orthopedics/Podiatry;  Laterality: Right;   CARDIAC CATHETERIZATION  11/2008   Dr. Renna Cary - 20% calcified non flow limiting left main, 50% EF apical hypokinesis   CARPAL TUNNEL RELEASE Bilateral    CERVICAL DISC ARTHROPLASTY N/A 04/07/2017   Procedure: Cervical six-seven Disc arthroplasty;  Surgeon: Elna Haggis, MD;  Location:  [facility] OR;  Service: Neurosurgery;  Laterality: N/A;   CHEST TUBE INSERTION Left 04/11/2017   Procedure: CHEST TUBE INSERTION;  Surgeon: Heriberto London, MD;  Location: [facility] OR;  Service: Thoracic;  Laterality: Left;   CHOLECYSTECTOMY     COLONOSCOPY  2016   CG-MAC-miralax (good)servere TICS/TA x 2   ESOPHAGEAL MANOMETRY N/A 05/30/2021   Procedure: ESOPHAGEAL MANOMETRY (EM);  Surgeon: Annis Kinder, DO;  Location: [facility];  Service: Gastroenterology;  Laterality: N/A;  ph impedence   ESOPHAGOGASTRODUODENOSCOPY     EYE SURGERY Bilateral 2011   steroidal encapsulation (04/28/2012)   FOOT SURGERY Right    x 2   JOINT REPLACEMENT     Many surgeries including 5 joit replacements   KNEE ARTHROSCOPY Bilateral    LATERAL / POSTERIOR COMBINED FUSION LUMBAR SPINE  2012   LEFT ATRIAL APPENDAGE OCCLUSION N/A 02/22/2021   Procedure: LEFT ATRIAL APPENDAGE OCCLUSION;  Surgeon: Boyce Byes, MD;  [facility];  Service: Cardiovascular;  Laterality: N/A;   NEUROPLASTY / TRANSPOSITION ULNAR NERVE AT ELBOW Bilateral    POLYPECTOMY  2016   severe TICS/TA x 2   POSTERIOR CERVICAL LAMINECTOMY WITH MET- RX Right 06/11/2021   Procedure: Right Cervical six-seven  Laminectomy and foraminotomy with metrex;  Surgeon: Elna Haggis, MD;  [facility];  Service: Neurosurgery;  Laterality: Right;   REFRACTIVE SURGERY Left 07/07/2023   laser surgery but still has floaters   REVERSE SHOULDER ARTHROPLASTY  04/28/2012   Procedure: REVERSE SHOULDER ARTHROPLASTY;  Surgeon: Derald Flattery, MD;  Location: [facility] OR;  Service: Orthopedics;  Laterality: Left;  Left shouder reverse total shoulder arthroplasty   SHOULDER SURGERY     SPINE SURGERY     I have had 7 spinal surgeries   TEE WITHOUT CARDIOVERSION N/A 02/22/2021   Procedure: TRANSESOPHAGEAL ECHOCARDIOGRAM (TEE);  Surgeon: Boyce Byes, MD;  Location: [facility] INVASIVE CV LAB;  Service: Cardiovascular;  Laterality: N/A;   TENDON  REPAIR Right 08/07/2020   Procedure: RIGHT HAND LIGAMENT RECONSTRUCTION AND TENDON INTERPOSITION;  Surgeon: Neil Balls, MD;  Location: [facility];  Service: Orthopedics;  Laterality: Right;   TONSILLECTOMY AND ADENOIDECTOMY  1954   TOTAL HIP ARTHROPLASTY Left 2011   left (04/28/2012)   TOTAL HIP ARTHROPLASTY Right 07/31/2015   Procedure: TOTAL HIP ARTHROPLASTY ANTERIOR APPROACH;  Surgeon: Neil Balls, MD;  [facility];  Service: Orthopedics;  Laterality: Right;   TOTAL KNEE ARTHROPLASTY  01/10/2012   Procedure: TOTAL KNEE ARTHROPLASTY;  Surgeon: Boston Byers, MD;  [facility]: [facility];  Service: Orthopedics;;  left total knee arthroplasty   TOTAL KNEE ARTHROPLASTY Right 06/17/2014   Procedure: TOTAL KNEE ARTHROPLASTY;  Surgeon: Boston Byers, MD;  [facility];  Service: Orthopedics;  Laterality: Right;   toupet fundolplication  11/01/2021  TRANSESOPHAGEAL ECHOCARDIOGRAM (CATH LAB) N/A 03/24/2023   Procedure: TRANSESOPHAGEAL ECHOCARDIOGRAM;  Surgeon: Hazle Lites, MD;  Location: [facility] INVASIVE CV LAB;  Service: Cardiovascular;  Laterality: N/A;   Social History   Socioeconomic History   Marital status: Married    Spouse name: Levi [name]   Number of children: 1   Years of education: 16   Highest education level: Bachelor's degree (e.g., BA, AB, BS)  Occupational History   Occupation: Retired    Comment: Nurse, adult from Forensic scientist , 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 Daughter lives two miles away. 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 Enjoys being active and working.       Tobacco use, amount per day now: None   Past tobacco use, amount per day: None   How many years did you use tobacco:   Alcohol use (drinks per week): None   Diet:   Do you drink/eat things with caffeine: Coffee   Marital status:  Married                                What year were you married? 1973   Do you live in a house, apartment, assisted living, condo, trailer, etc.? House    Is it one or more stories? 3 stories   How many persons live in your home? 2   Do you have pets in your home?( please list) 3 [employer]    Highest Level of education completed? Engineer, maintenance (IT).   Current or past profession: Police    Do you exercise?    Yes                              Type and how often? 5 times weekly   Do you have a living will? Yes   Do you have a DNR form?     Yes                              If not, do you want to discuss one?   Do you have signed POA/HPOA forms?    Yes                    If so, please bring to you appointment      Do you have any difficulty bathing or dressing yourself?    Do you have any difficulty preparing food or eating? No   Do you have any difficulty managing your medications? No   Do you have any difficulty managing your finances? No   Do you have any difficulty affording your medications? No   Social Drivers of Corporate investment banker Strain: Low Risk  (10/23/2023)   Overall Financial Resource Strain (CARDIA)    Difficulty of Paying Living Expenses: Not hard at all  Food Insecurity: No Food Insecurity (11/08/2023)   Hunger Vital Sign  Worried About Programme researcher, broadcasting/film/video in the Last Year: Never true    Ran Out of Food in the Last Year: Never true  Transportation Needs: No Transportation Needs (11/08/2023)   PRAPARE - Administrator, Civil Service (Medical): No    Lack of Transportation (Non-Medical): No  Physical Activity: Sufficiently Active (10/23/2023)   Exercise Vital Sign    Days of Exercise per Week: 5 days    Minutes of  Exercise per Session: 60 min  Stress: No Stress Concern Present (10/23/2023)   [employer] of Occupational Health - Occupational Stress Questionnaire    Feeling of Stress : Not at all  Social Connections: Socially Integrated (11/08/2023)   Social Connection and Isolation Panel    Frequency of Communication with Friends and Family: More than three times a week    Frequency of Social Gatherings with Friends and Family: More than three times a week    Attends Religious Services: More than 4 times per year    Active Member of [employer] or Organizations: Yes    Attends Engineer, structural: More than 4 times per year    Marital Status: Married   Family History  Problem Relation Age of Onset   Diabetes Mother    Heart disease Father        Died 103, MI   Cancer Father    Hyperlipidemia Father    Stroke Father    Prostate cancer Brother    Colon polyps Brother    Colon polyps Brother    Asthma Brother    COPD Brother    Colon cancer Brother        Dx age 58   Cancer Brother    Colon cancer Other    Esophageal cancer Neg Hx    Rectal cancer Neg Hx    Stomach cancer Neg Hx    Pancreatic cancer Neg Hx    Kidney disease Neg Hx    Liver disease Neg Hx    Neuropathy Neg Hx    - negative except otherwise stated in the family history section Allergies  Allergen Reactions   Levaquin  [Levofloxacin ] Itching    Tolerated ciprofloxacin  in 2022.   Prior to Admission medications   Medication Sig Start Date End Date Taking? Authorizing Provider  albuterol  (VENTOLIN  HFA) 108 (90 Base) MCG/ACT inhaler INHALE 2 PUFFS BY MOUTH EVERY 4 HOURS AS NEEDED FOR WHEEZE OR FOR SHORTNESS OF BREATH Patient taking differently: Inhale 1-2 puffs into the lungs every 4 (four) hours as needed. 01/14/23  Yes Young, Rupert Counts D, MD  allopurinol  (ZYLOPRIM ) 100 MG tablet TAKE 1 TABLET (100 MG TOTAL) BY MOUTH IN THE MORNING 09/02/23  Yes Gean Keels, MD  atorvastatin  (LIPITOR) 20 MG tablet TAKE 1  TABLET BY MOUTH EVERY DAY 09/02/23  Yes Hugh Madura, MD  ciprofloxacin  (CIPRO ) 500 MG tablet Take 500 mg by mouth 2 (two) times daily.   Yes [provider]  cyclobenzaprine  (FLEXERIL ) 10 MG tablet Take 1 tablet (10 mg total) by mouth 3 (three) times daily as needed for muscle spasms. 09/04/23  Yes Gean Keels, MD  diclofenac  Sodium (VOLTAREN ) 1 % GEL Apply 4 g topically as needed (joint pain).   Yes [provider]  diltiazem  (CARDIZEM  CD) 180 MG 24 hr capsule Take 1 capsule (180 mg total) by mouth daily. 08/25/23  Yes Hugh Madura, MD  diltiazem  (CARDIZEM ) 30 MG tablet TAKE 1 TABLET (30 MG TOTAL) BY MOUTH AS NEEDED (HEART  RATE OVER 120). 09/02/23  Yes Hugh Madura, MD  ELIQUIS  5 MG TABS tablet TAKE 1 TABLET BY MOUTH TWICE A DAY 07/28/23  Yes Hugh Madura, MD  ferrous gluconate  (FERGON) 324 MG tablet Take 1 tablet (324 mg total) by mouth daily. 06/18/23  Yes Tye Gall, MD  furosemide  (LASIX ) 40 MG tablet Take 1 tablet (40 mg total) by mouth daily. Patient taking differently: Take 40-80 mg by mouth daily as needed for edema. 05/27/23  Yes Tye Gall, MD  ipratropium-albuterol  (DUONEB) 0.5-2.5 (3) MG/3ML SOLN 1 NEBULE EVERY 6 HOURS AS NEEDED. **J45.3** Patient taking differently: Take 3 mLs by nebulization every 6 (six) hours as needed. 03/22/20  Yes Young, Rupert Counts D, MD  isosorbide  mononitrate (IMDUR ) 30 MG 24 hr tablet TAKE 1 TABLET BY MOUTH EVERY DAY 09/02/23  Yes Hugh Madura, MD  meclizine  (ANTIVERT ) 25 MG tablet TAKE 1 TABLET BY MOUTH 3 TIMES DAILY AS NEEDED FOR DIZZINESS. 07/03/23  Yes Gean Keels, MD  Olodaterol HCl (STRIVERDI RESPIMAT) 2.5 MCG/ACT AERS INHALE 2 PUFFS BY MOUTH INTO THE LUNGS DAILY Patient taking differently: Inhale 2 puffs into the lungs 2 (two) times daily. 03/10/23  Yes Young, Rupert Counts D, MD  ondansetron  (ZOFRAN -ODT) 8 MG disintegrating tablet Take 1 tablet (8 mg total) by mouth every 8 (eight) hours as needed for  nausea. 04/07/23  Yes Gean Keels, MD  oxyCODONE -acetaminophen  (PERCOCET) 5-325 MG tablet Take 1 tablet by mouth every 4 (four) hours as needed for severe pain (pain score 7-10). 10/16/23  Yes Dot Gazella, DPM  pantoprazole  (PROTONIX ) 40 MG tablet TAKE 1 TABLET (40 MG TOTAL) BY MOUTH TWICE A DAY BEFORE MEALS 08/14/23  Yes Gean Keels, MD  predniSONE  (DELTASONE ) 10 MG tablet Take 20 mg by mouth daily with breakfast. 07/05/23  Yes [provider]  pregabalin  (LYRICA ) 100 MG capsule Take 1 capsule (100 mg total) by mouth 2 (two) times daily. 06/17/23  Yes Tye Gall, MD  tamsulosin  (FLOMAX ) 0.4 MG CAPS capsule Take 0.4 mg by mouth at bedtime.   Yes [provider]   No results found. - pertinent xrays, CT, MRI studies were reviewed and independently interpreted  Positive ROS: All other systems have been reviewed and were otherwise negative with the exception of those mentioned in the HPI and as above.  Physical Exam: General: Alert, no acute distress Psychiatric: Patient is competent for consent with normal mood and affect Lymphatic: No axillary or cervical lymphadenopathy Cardiovascular: No pedal edema Respiratory: No cyanosis, no use of accessory musculature GI: No organomegaly, abdomen is soft and non-tender    Images:  @ENCIMAGES @  Labs:  Lab Results  Component Value Date   HGBA1C 5.8 (H) 07/05/2022   HGBA1C 5.5 10/21/2019   ESRSEDRATE 30 (H) 11/08/2023   ESRSEDRATE 11 10/08/2023   ESRSEDRATE 17 03/31/2023   CRP 21.0 (H) 11/10/2023   CRP 16.5 (H) 11/08/2023   CRP 11.7 (H) 03/31/2023   LABURIC 5.6 06/17/2023   LABURIC 6.4 07/05/2022   REPTSTATUS PENDING 11/08/2023   GRAMSTAIN NO WBC SEEN NO ORGANISMS SEEN  10/16/2023   CULT  11/08/2023    NO GROWTH 2 DAYS Performed at [facility], [address], [place], [place] [address]    [facility] STAPHYLOCOCCUS COHNII 10/16/2023    Lab Results  Component Value Date    ALBUMIN 2.9 (L) 11/08/2023   ALBUMIN 3.6 (L) 11/03/2023   ALBUMIN 3.6 (L) 10/08/2023   LABURIC 5.6 06/17/2023   LABURIC 6.4  07/05/2022        Latest Ref Rng & Units 11/11/2023    5:13 AM 11/10/2023    5:51 AM 11/09/2023    7:53 AM  CBC EXTENDED  WBC 4.0 - 10.5 K/uL 6.3  7.6  10.3   RBC 4.22 - 5.81 MIL/uL 3.19  3.11  3.33   Hemoglobin 13.0 - 17.0 g/dL 9.6  9.5  16.1   HCT 09.6 - 52.0 % 30.1  30.0  32.0   Platelets 150 - 400 K/uL 144  129  144     Neurologic: Patient does not have protective sensation bilateral lower extremities.   MUSCULOSKELETAL:   Skin: Examination patient has an ulcer beneath the base of the fifth metatarsal and cuboid.  Review of the MRI scan shows osteomyelitis involving the base of the fifth metatarsal and cuboid.  Ankle-brachial indices shows normal triphasic flow with adequate great toe pressure.  C-reactive protein 21.0 with hemoglobin 9.6 and white cell count of 6.3.  Hemoglobin A1c 5.8.  Assessment: Assessment: Osteomyelitis and ulceration right hindfoot with secondary sepsis.  Plan: Plan: Will continue the IV antibiotics to decrease the cellulitis in the right leg.  Will plan for a right below-knee amputation on Friday.  Risk and benefits and postoperative course was discussed the patient states he understands wished to proceed at this time.  Thank you for the consult and the opportunity to see Mr. Bessie Livingood, MD [facility] [phone] 8:13 AM

## 2023-11-11 NOTE — Care Management Important Message (Signed)
 Important Message  Patient Details  Name: Levi [name] MRN: [id number] Date of Birth: March 07, 1946   Important Message Given:  Yes - Medicare IM     Wynonia Hedges 11/11/2023, 3:32 PM

## 2023-11-11 NOTE — Plan of Care (Signed)
 Discussed with pt and reviewed Dr. Aniceto Kern note - appreciate his recommendations, plan for BKA on the RLE later this week.   Discussed with pt he should call when out of hospital to set follow up for left foot routine DM care in about 1 month from discharge.   Will sign off please message with questions.        Levi [name], DPM [facility] / [facility]                   11/11/2023

## 2023-11-11 NOTE — Progress Notes (Signed)
 PROGRESS NOTE    Levi [name]  BJY:[id number] DOB: 01-16-1946 DOA: 11/08/2023 PCP: Gean Keels, MD    Chief Complaint  Patient presents with   fall on thinners    Brief Narrative:   Levi [name] is a 78 y.o. male with medical history significant of PAF on Eliquis , HFpEF (EF 40-45% in 02/2023), HTN, HLD, COPD, BPH, GERD, and s/p recent LLE 2nd digit/RLE  5th digit ray amputation on 08/13/2023 p/w presyncopal episode iso sepsis 2/2 RLE cellulitis vs OM.   Pt states that he was in his USOH until 0400 this morning when he fell to the right while washing his hands after urinating. The pt reports waking up as usual, walking to restroom w/o issue, and using the restroom w/o issue before becoming weak and slumping to right. He banged his body against the wall and was in a slouching position when his wife found him and activated EMS. He denies LOC, but did strike his head against the bathroom wall. No obvious laceration or hemorrhage. Pt denies any h/o recurrent falls or syncopal episodes.   In the ED, pt tachycardic, and  tachypneic on RA. Labs notable for K 2.9, Cr 1.05, albumin 2.9, WBC 13.8, and lactate 2.4. CTH NAICA. CT C-spine no acute abnl. CXR no acute cardiopulmonary findings. CR R foot CT showed moderate edema, advanced Charcot foot, and old fifth toe amputation and remote resection of the second through fourth proximal phalangeal heads. Pt received IV vancomycin , IV cefepime , and IV metronidazole  in the ED and was admitted to medicine for sepsis of unknown origin.   MRI of the right foot significant for osteomyelitis.   Assessment & Plan:   Principal Problem:   Right foot infection Active Problems:   Sepsis (HCC)   Sepsis, present on admission, due to right lower extremity cellulitis, right foot foot osteomyelitis . -Sepsis present on admission, tachycardic, with leukocytosis and elevated lactic acid -MRI of right foot significant for osteomyelitis, CT of right lower  extremity significant for cellulitis - S/p recent RLE 5th ray resection - Antibiotics management per ID, continue with IV antibiotics, vancomycin , cefepime  and Flagyl . -Follow-up blood cultures, remains negative -Podiatry consulted, and ID consulted as well, after subspecialist team discussed at length with the patient, understands that IV antibiotics are unlikely to cure his infection, healing his wounds, and possibly cause more side effects, with eventual need for amputation, so he is currently wants to get rid of source of infection, and wants to proceed with right BKA, so Dr. Julio Ohm was consulted, he recommends to continue with IV antibiotics for another 2 to 3 days, and to proceed with BKA on Friday.   -PTA lyrica  100mg  BID, and percocet 5mg  q4h prn - CRP is elevated, Pro-Cal is elevated,  Syncope -Most likely due to above, continue with telemetry monitoring, continue with IV fluids  PAF -Continue with Eliquis  and diltiazem  - Eliquis  changed to Lovenox  in anticipation of surgery, hold Lovenox  the night before surgery and anticoagulation postoperatively  Chronic systolic CHF -Is on IV fluids, monitor closely, Lasix  remains on hold    HTN -PTA diltiazem  per above -HOLD pta Imdur  and Lasix    COPD -Brovana /pulmicort  nebs BID + Duonebs prn -Consider combination inhaler such as Breztri or Symbicort at discharge   BPH -PTA tamsulosin    Tension headache Left neck pain - Patient with tension headache, and left neck pain secondary to muscle spasms . Fall-continue with Fioricet. - Voltaren  gel, heat compress. - The head and cervical spine  with no acute abnormalities  DVT prophylaxis: on Eliquis  Code Status: Full Family Communication: Wife at bedside Disposition:   Status is: Inpatient    Consultants:  Podiatry   Subjective:  Complaining of tension headache and left neck spasms Objective: Vitals:   11/11/23 0800 11/11/23 1100 11/11/23 1200 11/11/23 1418  BP:   100/62  98/63  Pulse: 98 (!) 112 (!) 110   Resp: 17 (!) 22 (!) 21 18  Temp:  98.7 F (37.1 C)  97.7 F (36.5 C)  TempSrc:    Oral  SpO2: 93% 99% 98%   Weight:      Height:        Intake/Output Summary (Last 24 hours) at 11/11/2023 1442 Last data filed at 11/10/2023 1743 Gross per 24 hour  Intake --  Output 800 ml  Net -800 ml   Filed Weights   11/08/23 0518  Weight: 93 kg    Examination:  Awake Alert, Oriented X 3, No new F.N deficits, Normal affect Symmetrical Chest wall movement, Good air movement bilaterally, CTAB RRR,No Gallops,Rubs or new Murmurs, No Parasternal Heave +ve B.Sounds, Abd Soft, No tenderness, No rebound - guarding or rigidity. Right foot bandaged, but right lower extremity with +2 edema  pic on 6/15      Data Reviewed: I have personally reviewed following labs and imaging studies  CBC: Recent Labs  Lab 11/08/23 0511 11/08/23 0514 11/09/23 0753 11/10/23 0551 11/11/23 0513  WBC  --  13.8* 10.3 7.6 6.3  HGB 11.6* 11.6* 10.1* 9.5* 9.6*  HCT 34.0* 35.8* 32.0* 30.0* 30.1*  MCV  --  96.0 96.1 96.5 94.4  PLT  --  172 144* 129* 144*    Basic Metabolic Panel: Recent Labs  Lab 11/08/23 0511 11/08/23 0514 11/09/23 0753 11/10/23 0551 11/11/23 0513  NA 137 138 138 133* 133*  K 2.9* 2.9* 3.9 3.4* 4.0  CL 98 100 105 105 107  CO2  --  26 23 22  19*  GLUCOSE 83 88 107* 105* 103*  BUN 17 17 18 15 14   CREATININE 1.00 1.05 0.78 0.81 0.73  CALCIUM   --  8.5* 8.2* 7.5* 7.6*  MG  --   --   --  1.7  --   PHOS  --   --   --  2.2*  --     GFR: Estimated Creatinine Clearance: 91.3 mL/min (by C-G formula based on SCr of 0.73 mg/dL).  Liver Function Tests: Recent Labs  Lab 11/08/23 0514  AST 19  ALT 13  ALKPHOS 66  BILITOT 1.1  PROT 5.6*  ALBUMIN 2.9*    CBG: No results for input(s): GLUCAP in the last 168 hours.   Recent Results (from the past 240 hours)  Culture, blood (single) w Reflex to ID Panel     Status: None   Collection Time:  11/03/23 10:49 AM   Specimen: Blood   VB  Result Value Ref Range Status   BLOOD CULTURE, ROUTINE Final report  Final   Organism ID, Bacteria Comment  Final    Comment: No aerobic or anaerobic growth in five days.  Microscopic Examination     Status: Abnormal   Collection Time: 11/03/23 11:13 AM  Result Value Ref Range Status   WBC, UA 0-5 0 - 5 /hpf Final   RBC, Urine None seen 0 - 2 /hpf Final   Epithelial Cells (non renal) 0-10 0 - 10 /hpf Final   Casts None seen None seen /lpf Final   Crystals Present (  A) N/A Final   Crystal Type Calcium  Oxalate N/A Final   Bacteria, UA None seen None seen/Few Final  Urine Culture, Reflex     Status: None   Collection Time: 11/03/23 11:13 AM  Result Value Ref Range Status   Urine Culture, Routine Final report  Final   Organism ID, Bacteria Comment  Final    Comment: Mixed urogenital flora 25,000-50,000 colony forming units per mL   Culture, blood (Routine x 2)     Status: None (Preliminary result)   Collection Time: 11/08/23  5:14 AM   Specimen: BLOOD RIGHT FOREARM  Result Value Ref Range Status   Specimen Description BLOOD RIGHT FOREARM  Final   Special Requests   Final    BOTTLES DRAWN AEROBIC AND ANAEROBIC Blood Culture adequate volume   Culture   Final    NO GROWTH 3 DAYS Performed at [facility], [address], [place], [place] [address]    Report Status PENDING  Incomplete  Culture, blood (Routine x 2)     Status: None (Preliminary result)   Collection Time: 11/08/23  5:53 AM   Specimen: BLOOD  Result Value Ref Range Status   Specimen Description BLOOD BLOOD RIGHT ARM  Final   Special Requests   Final    BOTTLES DRAWN AEROBIC AND ANAEROBIC Blood Culture adequate volume   Culture   Final    NO GROWTH 3 DAYS Performed at [facility], [address] [place], [place] [address]    Report Status PENDING  Incomplete         Radiology Studies: CT TIBIA FIBULA RIGHT W CONTRAST Result Date: 11/11/2023 CLINICAL  DATA:  Right lower leg cellulitis. EXAM: CT OF THE LOWER RIGHT EXTREMITY WITH CONTRAST TECHNIQUE: Multidetector CT imaging of the right lower leg was performed according to the standard protocol following intravenous contrast administration. RADIATION DOSE REDUCTION: This exam was performed according to the departmental dose-optimization program which includes automated exposure control, adjustment of the mA and/or kV according to patient size and/or use of iterative reconstruction technique. CONTRAST:  75mL OMNIPAQUE  IOHEXOL  350 MG/ML SOLN COMPARISON:  None Available. FINDINGS: Bones/Joint/Cartilage Status post right total knee arthroplasty. No evidence of hardware loosening, acute fracture or dislocation. No evidence of osteomyelitis. Small knee joint effusion with mild synovial irregularity and small intra-articular calcifications. No significant findings at the ankle. There are mild midfoot degenerative changes. Ligaments Suboptimally assessed by CT. Muscles and Tendons The visualized quadriceps and patellar tendons appear intact. The ankle tendons appear intact, although their distal insertions are incompletely visualized. There is some degenerative calcification within the tibialis anterior tendon. No focal muscular atrophy or abnormal enhancement identified. Soft tissues Generalized subcutaneous edema throughout the lower leg, greatest distally. No focal fluid collection, unexpected foreign body, soft tissue emphysema or suspicious enhancement identified. There are extensive vascular calcifications without evidence of acute vascular thrombosis. IMPRESSION: 1. Generalized subcutaneous edema throughout the lower leg, greatest distally, consistent with cellulitis. No focal fluid collection, soft tissue emphysema or suspicious enhancement identified. 2. No acute osseous findings. Status post right total knee arthroplasty without evidence of hardware loosening or osteomyelitis. 3. Small knee joint effusion with  mild synovial irregularity and small intra-articular calcifications. Electronically Signed   By: Elmon Hagedorn M.D.   On: 11/11/2023 08:22        Scheduled Meds:  arformoterol   15 mcg Nebulization BID   atorvastatin   20 mg Oral Daily   budesonide  (PULMICORT ) nebulizer solution  0.25 mg Nebulization BID  cyanocobalamin   1,000 mcg Subcutaneous Daily   Followed by   Cecily Cohen ON 11/17/2023] cyanocobalamin   1,000 mcg Subcutaneous Weekly   diclofenac  Sodium  4 g Topical QID   diltiazem   180 mg Oral Daily   docusate sodium   100 mg Oral BID   enoxaparin  (LOVENOX ) injection  90 mg Subcutaneous Q12H   feeding supplement  237 mL Oral BID BM   pantoprazole   40 mg Oral BID   polyethylene glycol  17 g Oral Daily   pregabalin   100 mg Oral BID   tamsulosin   0.4 mg Oral QHS   Continuous Infusions:  ceFEPime  (MAXIPIME ) IV 2 g (11/11/23 0559)   vancomycin        LOS: 3 days     Seena Dadds, MD [facility]   To contact the attending provider between 7A-7P or the covering provider during after hours 7P-7A, please log into the web site [email or web address] and access using universal Lena password for that web site. If you do not have the password, please call the hospital operator.  11/11/2023, 2:42 PM

## 2023-11-12 DIAGNOSIS — L089 Local infection of the skin and subcutaneous tissue, unspecified: Secondary | ICD-10-CM | POA: Diagnosis not present

## 2023-11-12 LAB — CBC
HCT: 29.1 % — ABNORMAL LOW (ref 39.0–52.0)
Hemoglobin: 9.4 g/dL — ABNORMAL LOW (ref 13.0–17.0)
MCH: 30.5 pg (ref 26.0–34.0)
MCHC: 32.3 g/dL (ref 30.0–36.0)
MCV: 94.5 fL (ref 80.0–100.0)
Platelets: 152 10*3/uL (ref 150–400)
RBC: 3.08 MIL/uL — ABNORMAL LOW (ref 4.22–5.81)
RDW: 15 % (ref 11.5–15.5)
WBC: 5.9 10*3/uL (ref 4.0–10.5)
nRBC: 0 % (ref 0.0–0.2)

## 2023-11-12 LAB — BASIC METABOLIC PANEL WITH GFR
Anion gap: 7 (ref 5–15)
BUN: 13 mg/dL (ref 8–23)
CO2: 21 mmol/L — ABNORMAL LOW (ref 22–32)
Calcium: 7.9 mg/dL — ABNORMAL LOW (ref 8.9–10.3)
Chloride: 104 mmol/L (ref 98–111)
Creatinine, Ser: 0.64 mg/dL (ref 0.61–1.24)
GFR, Estimated: >60 mL/min (ref >60)
Glucose, Bld: 75 mg/dL (ref 70–99)
Potassium: 3.4 mmol/L — ABNORMAL LOW (ref 3.5–5.1)
Sodium: 132 mmol/L — ABNORMAL LOW (ref 135–145)

## 2023-11-12 LAB — PROCALCITONIN: Procalcitonin: 0.56 ng/mL

## 2023-11-12 MED ORDER — POTASSIUM CHLORIDE CRYS ER 20 MEQ PO TBCR
40.0000 meq | EXTENDED_RELEASE_TABLET | Freq: Once | ORAL | Status: AC
  Administered 2023-11-12: 40 meq via ORAL
  Filled 2023-11-12: qty 2

## 2023-11-12 NOTE — Progress Notes (Signed)
 Physical Therapy Treatment Patient Details Name: Levi [name] MRN: [id number] DOB: 10-24-1945 Today's Date: 11/12/2023   History of Present Illness 78 yo male presents to [facility] on 6/14 with mechanical fall + head trauma. Workup for septic shock due to RLE cellulitis and possible osteomyelitis. CTH and CT Cspine negative for acute findings. Podiatry consult 6/16 with planned BKA on Friday, June 20. PMHx:  admission 03/2023 for RLE wound and cellulitis, agent orange exposure, afib, neuropathy, osteoporosis, CHF, COPD, HLD and venous insufficiency of both lower extremities, PAF, L 2nd and 5th toe amputations 07/2023, recent RLE 5th ray resection, ACDF 2014 and 2023, lumbar fusion 2018, x5 joint replacements.    PT Comments  PTA returned to room additional session to give paperwork on HEP and post-op education so pt can prepare and work on limb strengthening prior to upcoming surgery. Reviewed positioning in supine/prone postures post-op as able, phantom pain/sensation and management, limb wrapping techniques and resting posture for flexibility/to prevent limb contracture post-op as well as skin care and signs/symptoms of infection. Pt agreeable to remain up in recliner and mobility staff notified PTA recommend Stedy for transfers for pt/staff safety, especially as pt may need to use Stedy post-op and will be good to become familiar with device. Currently pt does not have drop arm recliner in his room so will need one to work on squat/lateral scoot transfer training. Patient will benefit from intensive inpatient follow-up therapy, >3 hours/day.   If plan is discharge home, recommend the following: A lot of help with walking and/or transfers;A lot of help with bathing/dressing/bathroom;Assist for transportation;Help with stairs or ramp for entrance;Assistance with cooking/housework   Can travel by Doctor, hospital (measurements PT);Wheelchair cushion (measurements  PT);Other (comment) (Amputee wheelchair with removable arm and leg rests; likely to need slide board, hospital bed, drop arm BSC if he goes directly home after amputation surgery)    Recommendations for Other Services Rehab consult     Precautions / Restrictions Precautions Precautions: Fall Recall of Precautions/Restrictions: Intact Required Braces or Orthoses: Other Brace Other Brace: in post op shoe right foot Restrictions Weight Bearing Restrictions Per Provider Order: Yes RLE Weight Bearing Per Provider Order: Weight bearing as tolerated Other Position/Activity Restrictions: post op shoe     Mobility  Bed Mobility Overal bed mobility: Needs Assistance Bed Mobility: Supine to Sit     Supine to sit: HOB elevated, Min assist     General bed mobility comments: pt received in chair    Transfers Overall transfer level: Needs assistance Equipment used: Rolling walker (2 wheels) Transfers: Sit to/from Stand Sit to Stand: From elevated surface, Min assist, +2 safety/equipment           General transfer comment: pt up in chair    Ambulation/Gait Ambulation/Gait assistance: Min assist, +2 safety/equipment Gait Distance (Feet): 15 Feet Assistive device: Rolling walker (2 wheels) Gait Pattern/deviations: Decreased step length - right, Decreased step length - left, Decreased stance time - right, Decreased weight shift to right, Trunk flexed, Wide base of support, Step-to pattern Gait velocity: decreased     General Gait Details: Pt with bowel urgency reported when PTA arrived to his room so bari BSC brought to room in case pt not able to tolerate gait however pt able to ambulate short distance to bathroom with RW support and able to turn at sink and perform backward steps into bathroom due to many lines and limited space to maneuver, with  heavy minA due to extensive cues needed for safety, foot placement and assist to manage RW, especially while backing up. Pt not able to  maintain RW proximity to his hips unassisted.   Stairs             [employer] mobility:  (Pt spouse and other family member shown how to remove chair arm and visual/verbal demo for slide board (vs squat pivot) transfer to [facility] while pt in bathroom for toileting. Not able to assess WC transfers with patient today due to pt >10 mins in toilet rm)   Tilt Bed    Modified Rankin (Stroke Patients Only)       Balance Overall balance assessment: Needs assistance, History of Falls Sitting-balance support: Bilateral upper extremity supported, Feet supported Sitting balance-[name] Scale: Fair Sitting balance - Comments: pt up in recliner   Standing balance support: During functional activity, Bilateral upper extremity supported, Reliant on assistive device for balance Standing balance-[name] Scale: Poor Standing balance comment: relies on UE support                            Communication Communication Communication: No apparent difficulties  Cognition Arousal: Alert Behavior During Therapy: WFL for tasks assessed/performed   PT - Cognitive impairments: Safety/Judgement, Problem solving                       PT - Cognition Comments: HEP and post-op amputee handout brought to room to reinforce importance of continued activity and strengthening in supine and chair postures given upcoming surgery Following commands: Intact      Cueing Cueing Techniques: Verbal cues  Exercises Other Exercises Other Exercises: HEP link: [email or web address] Access code: [email or web address] for supine/seated HEP Other Exercises: Reviewed supine quad/glute sets, supine hip extension, seated knee flex/ext, and chair push-ups via HEP handout, pt defers to perform at time of session just recently got to chair with assist from OT after toileting. Spouse has handout and agreeable to help pt to perform TID prior to and after surgery, with PTA noting that  post-op PT eval may change his exercise plan slightly.    General Comments General comments (skin integrity, edema, etc.): pt reclined in chair, SpO2 WFL, HR >100 bpm RN aware; Reviewed positioning in supine/prone postures post-op as able, phantom pain/sensation and management of such via rubbing/tapping as able, depending on dressing and limb healing, limb wrapping techniques and resting posture for flexibility/to prevent limb contracture post-op as well as skin care and s/sx infection      Pertinent Vitals/Pain Pain Assessment Pain Assessment: Faces Faces Pain Scale: Hurts little more Pain Location: R LE Pain Descriptors / Indicators: Grimacing, Guarding, Discomfort, Sore Pain Intervention(s): Limited activity within patient's tolerance, Monitored during session    Home Living                          Prior Function            PT Goals (current goals can now be found in the care plan section) Acute Rehab PT Goals Patient Stated Goal: To be moving more independently after my surgery PT Goal Formulation: With patient Time For Goal Achievement: 11/23/23 Progress towards PT goals: Progressing toward goals    Frequency    Min 3X/week      PT Plan      Co-evaluation  AM-PAC PT 6 Clicks Mobility   Outcome Measure  Help needed turning from your back to your side while in a flat bed without using bedrails?: A Little Help needed moving from lying on your back to sitting on the side of a flat bed without using bedrails?: A Lot (a little using hospital bed rail/features) Help needed moving to and from a bed to a chair (including a wheelchair)?: A Lot Help needed standing up from a chair using your arms (e.g., wheelchair or bedside chair)?: A Lot Help needed to walk in hospital room?: Total (<36ft) Help needed climbing 3-5 steps with a railing? : Total 6 Click Score: 11    End of Session Equipment Utilized During Treatment: Gait belt Activity  Tolerance: Patient tolerated treatment well Patient left: in chair;with call bell/phone within reach;with chair alarm set;with family/visitor present Nurse Communication: Mobility status;Need for lift equipment;Other (comment) (rec +2 Stedy for nursing staff to transfer him for safety given lines and pt fatigue/poor RW mgmt) PT Visit Diagnosis: Other abnormalities of gait and mobility (R26.89);Muscle weakness (generalized) (M62.81)     Time: 1478-2956 PT Time Calculation (min) (ACUTE ONLY): 10 min  Charges:    $Gait Training: 8-22 mins $Therapeutic Activity: 8-22 mins PT General Charges $$ ACUTE PT VISIT: 1 Visit                     Suhey Radford P., PTA Acute Rehabilitation Services Secure Chat Preferred 9a-5:30pm Office: [phone]    Mariel Shope [facility] 11/12/2023, 2:48 PM

## 2023-11-12 NOTE — Plan of Care (Signed)
  Problem: Education: Goal: Knowledge of General Education information will improve Description: Including pain rating scale, medication(s)/side effects and non-pharmacologic comfort measures Outcome: Not Progressing   Problem: Health Behavior/Discharge Planning: Goal: Ability to manage health-related needs will improve Outcome: Not Progressing   Problem: Clinical Measurements: Goal: Ability to maintain clinical measurements within normal limits will improve Outcome: Not Progressing Goal: Will remain free from infection Outcome: Not Progressing Goal: Diagnostic test results will improve Outcome: Not Progressing Goal: Respiratory complications will improve Outcome: Not Progressing Goal: Cardiovascular complication will be avoided Outcome: Not Progressing   Problem: Nutrition: Goal: Adequate nutrition will be maintained Outcome: Not Progressing   Problem: Coping: Goal: Level of anxiety will decrease Outcome: Not Progressing   

## 2023-11-12 NOTE — TOC Progression Note (Signed)
 Transition of Care [facility]) - Progression Note    Patient Details  Name: Levi [name] MRN: [id number] Date of Birth: May 11, 1946  Transition of Care [facility]) CM/SW Contact  Jannice Mends, LCSW Phone Number: 11/12/2023, 9:28 AM  Clinical Narrative:    Continuing to follow for post-surgical rehab needs.    Expected Discharge Plan: Skilled Nursing Facility Barriers to Discharge: Continued Medical Work up, SNF Pending bed offer  Expected Discharge Plan and Services In-house Referral: Clinical Social Work   Post Acute Care Choice: Skilled Nursing Facility Living arrangements for the past 2 months: Single Family Home                                       Social Determinants of Health (SDOH) Interventions SDOH Screenings   Food Insecurity: No Food Insecurity (11/08/2023)  Housing: Low Risk  (11/08/2023)  Transportation Needs: No Transportation Needs (11/08/2023)  Utilities: Not At Risk (11/08/2023)  Alcohol Screen: Low Risk  (10/23/2023)  Depression (PHQ2-9): Low Risk  (10/23/2023)  Financial Resource Strain: Low Risk  (10/23/2023)  Physical Activity: Sufficiently Active (10/23/2023)  Social Connections: Socially Integrated (11/08/2023)  Stress: No Stress Concern Present (10/23/2023)  Tobacco Use: Low Risk  (11/07/2023)  Health Literacy: Adequate Health Literacy (10/23/2023)    Readmission Risk Interventions     No data to display

## 2023-11-12 NOTE — Progress Notes (Signed)
 Physical Therapy Treatment Patient Details Name: Levi [name] MRN: [id number] DOB: 1945-11-27 Today's Date: 11/12/2023   History of Present Illness 78 yo male presents to [facility] on 6/14 with mechanical fall + head trauma. Workup for septic shock due to RLE cellulitis and possible osteomyelitis. CTH and CT Cspine negative for acute findings. Podiatry consult 6/16 with planned BKA on Friday, June 20. PMHx:  admission 03/2023 for RLE wound and cellulitis, agent orange exposure, afib, neuropathy, osteoporosis, CHF, COPD, HLD and venous insufficiency of both lower extremities, PAF, L 2nd and 5th toe amputations 07/2023, recent RLE 5th ray resection, ACDF 2014 and 2023, lumbar fusion 2018, x5 joint replacements.    PT Comments  Pt received in supine, agreeable to therapy session with pt requesting assist to mobilize to bathroom and with good participation for transfers and gait training. Pt with improved sit<>stand technique this date but still needing mod cues for set-up and sequencing and pt demonstrates difficulty managing RW, especially in narrow areas of the room and tends to keep his BOS outside bounds of RW, causing increased fall risk. Pt needing extended time on toilet, OT and RN notified and family present outside his room and receptive to discussion on possible post-op DME needs and rough timeline for healing/progression of therapies. Plan to bring HEP to his room for further instruction so pt can review post-op information. Spouse asking for more info from case management regarding DME availability and process, reporting she wants to know what is covered in case she needs to rent equipment. Patient will benefit from intensive inpatient follow-up therapy, >3 hours/day.     If plan is discharge home, recommend the following: A lot of help with walking and/or transfers;A lot of help with bathing/dressing/bathroom;Assist for transportation;Help with stairs or ramp for entrance;Assistance with  cooking/housework   Can travel by Doctor, hospital (measurements PT);Wheelchair cushion (measurements PT);Other (comment) (will need amputee wheelchair with removable arm and leg rests, likely to need slide board, hospital bed, drop arm BSC if he goes directly home after amputation surgery)    Recommendations for Other Services Rehab consult     Precautions / Restrictions Precautions Precautions: Fall Recall of Precautions/Restrictions: Intact Required Braces or Orthoses: Other Brace Other Brace: in post op shoe right foot Restrictions Weight Bearing Restrictions Per Provider Order: Yes RLE Weight Bearing Per Provider Order: Weight bearing as tolerated Other Position/Activity Restrictions: post op shoe     Mobility  Bed Mobility Overal bed mobility: Needs Assistance Bed Mobility: Supine to Sit     Supine to sit: HOB elevated, Min assist     General bed mobility comments: Cues for starting to move BLE with good effort by pt, HOB ~20 deg with pt reporting he sometimes sleeps in a lift chair, increased effort to achieve L EOB, minA for trunk stability upon rising.    Transfers Overall transfer level: Needs assistance Equipment used: Rolling walker (2 wheels) Transfers: Sit to/from Stand Sit to Stand: From elevated surface, Min assist, +2 safety/equipment           General transfer comment: from elevated bed>RW and RW>elevated BSC, cues needed for safe UE and LE placement for improved technique. Pt reports he sometimes sleeps in a lift chair so height elevated for ease of transfer.    Ambulation/Gait Ambulation/Gait assistance: Min assist, +2 safety/equipment Gait Distance (Feet): 15 Feet Assistive device: Rolling walker (2 wheels) Gait Pattern/deviations: Decreased step length - right, Decreased step  length - left, Decreased stance time - right, Decreased weight shift to right, Trunk flexed, Wide base of support, Step-to  pattern Gait velocity: decreased     General Gait Details: Pt with bowel urgency reported when PTA arrived to his room so bari BSC brought to room in case pt not able to tolerate gait however pt able to ambulate short distance to bathroom with RW support and able to turn at sink and perform backward steps into bathroom due to many lines and limited space to maneuver, with heavy minA due to extensive cues needed for safety, foot placement and assist to manage RW, especially while backing up. Pt not able to maintain RW proximity to his hips unassisted.   Stairs             [employer] mobility:  (Pt spouse and other family member shown how to remove chair arm and visual/verbal demo for slide board (vs squat pivot) transfer to [facility] while pt in bathroom for toileting. Not able to assess WC transfers with patient today due to pt >10 mins in toilet rm)   Tilt Bed    Modified Rankin (Stroke Patients Only)       Balance Overall balance assessment: Needs assistance, History of Falls Sitting-balance support: No upper extremity supported, Feet supported Sitting balance-[name] Scale: Fair     Standing balance support: During functional activity, Bilateral upper extremity supported, Reliant on assistive device for balance Standing balance-[name] Scale: Poor Standing balance comment: relies on UE support                            Communication Communication Communication: No apparent difficulties  Cognition Arousal: Alert Behavior During Therapy: WFL for tasks assessed/performed   PT - Cognitive impairments: Safety/Judgement, Problem solving                       PT - Cognition Comments: Pt eager to mobilize, needs frequent cues for BLE sequencing with transfers and gait for RW management. Following commands: Intact      Cueing Cueing Techniques: Verbal cues, Visual cues  Exercises Other Exercises Other Exercises: Plan to  bring HEP and post-op amputee handout to room later in day for pt to read prior to his surgery so he can begin working on bed and chair level exercises and for further info on residual limb care and positioning.    General Comments General comments (skin integrity, edema, etc.): pt with BLE edema and RLE reddish color 2/2 cellulitis. Pt impulsive to stand to mobilize to bathroom before RLE post-op shoe could be placed 2/2 bowel urgency.      Pertinent Vitals/Pain Pain Assessment Pain Assessment: Faces Faces Pain Scale: Hurts little more Pain Location: BLE, no specific location reported Pain Descriptors / Indicators: Grimacing, Guarding, Discomfort, Sore Pain Intervention(s): Limited activity within patient's tolerance, Monitored during session, Repositioned    Home Living                          Prior Function            PT Goals (current goals can now be found in the care plan section) Acute Rehab PT Goals Patient Stated Goal: back to doing more for self, back to baseline mobility PT Goal Formulation: With patient Time For Goal Achievement: 11/23/23 Progress towards PT goals: Progressing toward goals    Frequency  Min 3X/week      PT Plan      Co-evaluation              AM-PAC PT 6 Clicks Mobility   Outcome Measure  Help needed turning from your back to your side while in a flat bed without using bedrails?: A Little Help needed moving from lying on your back to sitting on the side of a flat bed without using bedrails?: A Lot (a little when using bed features) Help needed moving to and from a bed to a chair (including a wheelchair)?: A Lot Help needed standing up from a chair using your arms (e.g., wheelchair or bedside chair)?: A Lot Help needed to walk in hospital room?: Total (<29ft) Help needed climbing 3-5 steps with a railing? : Total 6 Click Score: 11    End of Session Equipment Utilized During Treatment: Gait belt Activity Tolerance:  Patient tolerated treatment well;Treatment limited secondary to medical complications (Comment);Other (comment) (bowel urgency limiting interventions/transfers attempted) Patient left: with call bell/phone within reach;with family/visitor present;Other (comment) (pt sitting on elevated BSC, family waiting in hallway for him to finish toileting, OT preparing to enter his room to work with him) Nurse Communication: Mobility status;Other (comment) (Incr HR, noisy SpO2 signal but appears WFL, toileting needs (OT arriving)) PT Visit Diagnosis: Other abnormalities of gait and mobility (R26.89);Muscle weakness (generalized) (M62.81)     Time: 6213-0865 PT Time Calculation (min) (ACUTE ONLY): 30 min  Charges:    $Gait Training: 8-22 mins $Therapeutic Activity: 8-22 mins PT General Charges $$ ACUTE PT VISIT: 1 Visit                     Kimori Tartaglia P., PTA Acute Rehabilitation Services Secure Chat Preferred 9a-5:30pm Office: [phone]    Mariel Shope [facility] 11/12/2023, 12:40 PM

## 2023-11-12 NOTE — Progress Notes (Signed)
 Occupational Therapy Treatment Patient Details Name: Levi [name] MRN: [id number] DOB: [birth year] Today's Date: 11/12/2023   History of present illness 78 yo male presents to [facility] on 6/14 with mechanical fall + head trauma. Workup for septic shock due to RLE cellulitis and possible osteomyelitis. CTH and CT Cspine negative for acute findings. Podiatry consult 6/16 with planned BKA on Friday, June 20. PMHx:  admission 03/2023 for RLE wound and cellulitis, agent orange exposure, afib, neuropathy, osteoporosis, CHF, COPD, HLD and venous insufficiency of both lower extremities, PAF, L 2nd and 5th toe amputations 07/2023, recent RLE 5th ray resection, ACDF 2014 and 2023, lumbar fusion 2018, x5 joint replacements.   OT comments  Pt received on BSC placed over toilet, attempting to have BM. Moderate assistance to reposition on BSC, heavy min assist to stand. Total assist for pericare. Pt ambulated with min assist out of bathroom to recliner, some assist to guide walker and cues to stay within walker. Pt with report of dizziness, VSS. Pt completed one grooming task seated in chair. Pt to have R BKA on Friday 6/20. Patient will benefit from intensive inpatient follow-up therapy, >3 hours/day.      If plan is discharge home, recommend the following:  A lot of help with bathing/dressing/bathroom;Assistance with cooking/housework;Help with stairs or ramp for entrance;Assist for transportation;A lot of help with walking and/or transfers   Equipment Recommendations  Other (comment) (defer to next venue)    Recommendations for Other Services Rehab consult    Precautions / Restrictions Precautions Precautions: Fall Recall of Precautions/Restrictions: Intact Required Braces or Orthoses: Other Brace Other Brace: in post op shoe right foot Restrictions Weight Bearing Restrictions Per Provider Order: Yes RLE Weight Bearing Per Provider Order: Weight bearing as tolerated Other Position/Activity Restrictions:  post op shoe       Mobility Bed Mobility                    Transfers Overall transfer level: Needs assistance Equipment used: Rolling walker (2 wheels) Transfers: Sit to/from Stand Sit to Stand: From elevated surface, Min assist, +2 safety/equipment           General transfer comment: from [facility] over toilet     Balance Overall balance assessment: Needs assistance, History of Falls   Sitting balance-[name] Scale: Fair     Standing balance support: During functional activity, Bilateral upper extremity supported, Reliant on assistive device for balance Standing balance-[name] Scale: Poor Standing balance comment: relies on B UE support                           ADL either performed or assessed with clinical judgement   ADL Overall ADL's : Needs assistance/impaired     Grooming: Wash/dry hands;Sitting;Set up                   Toilet Transfer: Minimal assistance;Ambulation;BSC/3in1;Rolling walker (2 wheels)   Toileting- Clothing Manipulation and Hygiene: Total assistance;Sit to/from stand       Functional mobility during ADLs: Minimal assistance;Rolling walker (2 wheels)      Extremity/Trunk Assessment              Vision       Perception     Praxis     Communication Communication Communication: No apparent difficulties   Cognition Arousal: Alert Behavior During Therapy: WFL for tasks assessed/performed Cognition: No apparent impairments  Cueing   Cueing Techniques: Verbal cues  Exercises      Shoulder Instructions       General Comments pt with BLE edema and RLE reddish color 2/2 cellulitis. Pt impulsive to stand to mobilize to bathroom before RLE post-op shoe could be placed 2/2 bowel urgency.    Pertinent Vitals/ Pain       Pain Assessment Pain Assessment: Faces Faces Pain Scale: Hurts little more Pain Location: R LE Pain Descriptors / Indicators:  Grimacing, Guarding, Discomfort, Sore Pain Intervention(s): Monitored during session  Home Living                                          Prior Functioning/Environment              Frequency  Min 2X/week        Progress Toward Goals  OT Goals(current goals can now be found in the care plan section)  Progress towards OT goals: Progressing toward goals  Acute Rehab OT Goals OT Goal Formulation: With patient Time For Goal Achievement: 11/24/23 Potential to Achieve Goals: Good  Plan      Co-evaluation                 AM-PAC OT 6 Clicks Daily Activity     Outcome Measure   Help from another person eating meals?: None Help from another person taking care of personal grooming?: A Little Help from another person toileting, which includes using toliet, bedpan, or urinal?: Total Help from another person bathing (including washing, rinsing, drying)?: A Lot Help from another person to put on and taking off regular upper body clothing?: A Little Help from another person to put on and taking off regular lower body clothing?: Total 6 Click Score: 14    End of Session Equipment Utilized During Treatment: Rolling walker (2 wheels);Gait belt  OT Visit Diagnosis: Other abnormalities of gait and mobility (R26.89);Muscle weakness (generalized) (M62.81);Pain;History of falling (Z91.81) Pain - Right/Left: Right Pain - part of body: Ankle and joints of foot   Activity Tolerance Patient tolerated treatment well   Patient Left in chair;with call bell/phone within reach;with chair alarm set;with family/visitor present   Nurse Communication Other (comment) (pt had BM)        Time: 9147-8295 OT Time Calculation (min): 29 min  Charges: OT General Charges $OT Visit: 1 Visit OT Treatments $Self Care/Home Management : 8-22 mins  Jonette Nestle 11/12/2023, 1:03 PM Avanell Leigh, OTR/L Acute Rehabilitation Services Office: [phone]

## 2023-11-12 NOTE — Progress Notes (Signed)
 Pharmacy Antibiotic Note  Levi [name] is a 78 y.o. male admitted on 11/08/2023 with wound infection.  Pharmacy has been consulted for vancomycin  and cefepime  dosing. On ciprofloxacin  prior to admission for left wound infection - prescribed by [facility].  Plan for BKA on Friday. ID recommends stopping abx 24 hr post op.  Plan: Vancomycin  1250mg  IV every 12 hours (eAUC 465, Scr <0.8) Cefepime  2g IV Q8H F/u cultures and clinical progression  Height: 6' 2 (188 cm) Weight: 93 kg (205 lb) IBW/kg (Calculated) : 82.2  Temp (24hrs), Avg:98.2 F (36.8 C), Min:97.3 F (36.3 C), Max:99.6 F (37.6 C)  Recent Labs  Lab 11/08/23 0512 11/08/23 0514 11/08/23 1259 11/09/23 0753 11/10/23 0551 11/11/23 0513 11/12/23 0547  WBC  --  13.8*  --  10.3 7.6 6.3 5.9  CREATININE  --  1.05  --  0.78 0.81 0.73 0.64  LATICACIDVEN 2.4*  --  2.4*  --   --   --   --     Estimated Creatinine Clearance: 91.3 mL/min (by C-G formula based on SCr of 0.64 mg/dL).    Allergies  Allergen Reactions   Levaquin  [Levofloxacin ] Itching    Tolerated ciprofloxacin  in 2022.    Antimicrobials this admission: Vancomycin  6/14 >>  Cefepime  6/14 >> Metronidazole  6/14 x1  Microbiology results: 6/14 BCx: NGTD  Ivery Marking, PharmD, BCIDP, AAHIVP, CPP Infectious Disease Pharmacist 11/12/2023 9:07 AM

## 2023-11-12 NOTE — Progress Notes (Addendum)
 ID brief note  Afebrile  6/14 blood cx NG in 3 days      Latest Ref Rng & Units 11/11/2023    5:13 AM 11/10/2023    5:51 AM 11/09/2023    7:53 AM  CBC  WBC 4.0 - 10.5 K/uL 6.3  7.6  10.3   Hemoglobin 13.0 - 17.0 g/dL 9.6  9.5  41.3   Hematocrit 39.0 - 52.0 % 30.1  30.0  32.0   Platelets 150 - 400 K/uL 144  129  144       Latest Ref Rng & Units 11/11/2023    5:13 AM 11/10/2023    5:51 AM 11/09/2023    7:53 AM  CMP  Glucose 70 - 99 mg/dL 244  010  272   BUN 8 - 23 mg/dL 14  15  18    Creatinine 0.61 - 1.24 mg/dL 5.36  6.44  0.34   Sodium 135 - 145 mmol/L 133  133  138   Potassium 3.5 - 5.1 mmol/L 4.0  3.4  3.9   Chloride 98 - 111 mmol/L 107  105  105   CO2 22 - 32 mmol/L 19  22  23    Calcium  8.9 - 10.3 mg/dL 7.6  7.5  8.2     CT right fibula with no abscess or osteomyelitis, soft tissue emphysema.  Small knee joint effusion with mild synovial irregularity and small intra-articular calcifications  Seen by Dr. Julio Ohm and plan for BKA on 6/20  Continue vancomycin , pharmacy to dose and cefepime  ( day 4).  Can discontinue antibiotics 24 to 48 hours after BKA if no concerns of remaining infection, cellulitis has resolved   ID will so, recall back with questions or concerns    Terre Ferri, MD Infectious Disease Physician [facility] [address] [place], [place] [address] Phone: [phone]  Fax: [phone] '

## 2023-11-12 NOTE — Plan of Care (Signed)
 progressing

## 2023-11-12 NOTE — Progress Notes (Signed)
 PROGRESS NOTE    Levi [name]  ZOX:[id number] DOB: 14-Mar-1946 DOA: 11/08/2023 PCP: Gean Keels, MD    Chief Complaint  Patient presents with   fall on thinners    Brief Narrative:  LEDGER HEINDL is a 78 y.o. male with medical history significant of PAF on Eliquis , HFpEF (EF 40-45% in 02/2023), HTN, HLD, COPD, BPH, GERD, and s/p recent LLE 2nd digit/RLE  5th digit ray amputation on 08/13/2023 p/w presyncopal episode with notable sepsis 2/2 RLE cellulitis vs OM.  MRI confirmed osteomyelitis.  Orthopedic called in consult.  Assessment & Plan:   Principal Problem:   Right foot infection Active Problems:   Sepsis (HCC)  Sepsis, present on admission, due to right lower extremity cellulitis, right foot foot osteomyelitis . -Sepsis present on admission, tachycardic, with leukocytosis and elevated lactic acid -MRI of right foot significant for osteomyelitis, CT of right lower extremity significant for cellulitis - S/p recent RLE 5th ray resection - Antibiotics management per ID, continue with IV antibiotics, vancomycin , cefepime  and Flagyl . -Follow-up blood cultures, remains negative -Podiatry consulted, and ID consulted as well, after subspecialist team discussed at length with the patient, understands that IV antibiotics are unlikely to cure his infection, healing his wounds, and possibly cause more side effects, with eventual need for amputation, so he is currently wants to get rid of source of infection, and wants to proceed with right BKA, so Dr. Julio Ohm was consulted, he recommends to continue with IV antibiotics for another 2 to 3 days, and to proceed with BKA on Friday.   -PTA lyrica  100mg  BID, and percocet 5mg  q4h prn -CRP is elevated, Pro-Cal is elevated,  Syncope, resolved -Most likely due to above, continue with telemetry monitoring, continue with IV fluids  PAF -Continue with Eliquis  and diltiazem  - Eliquis  changed to Lovenox  in anticipation of surgery, hold Lovenox   the night before surgery and anticoagulation postoperatively  Chronic systolic CHF -Is on IV fluids, monitor closely, Lasix  remains on hold    HTN -PTA diltiazem  per above -HOLD pta Imdur  and Lasix    COPD -Brovana /pulmicort  nebs BID + Duonebs prn -Consider combination inhaler such as Breztri or Symbicort at discharge   BPH -PTA tamsulosin    Tension headache Left neck pain - Patient with tension headache, and left neck pain secondary to muscle spasms . Fall-continue with Fioricet. - Voltaren  gel, heat compress. - The head and cervical spine with no acute abnormalities  DVT prophylaxis: on Eliquis  Code Status: Full Family Communication: None present Disposition: To be determined  Status is: Inpatient  Consultants:  Podiatry   Subjective: No acute issues or events reported overnight, at bedside patient is complaining of tension headache and left neck spasms/pain.  Otherwise denies nausea vomiting diarrhea constipation any fevers chills or shortness of breath  Objective: Vitals:   11/11/23 1931 11/11/23 2000 11/12/23 0058 11/12/23 0507  BP: 127/63 (!) 121/55 104/64 111/61  Pulse: (!) 104 97  78  Resp: 15 15  14   Temp: 99.6 F (37.6 C)  97.8 F (36.6 C) 97.9 F (36.6 C)  TempSrc: Oral  Oral Axillary  SpO2: 96% 95%  100%  Weight:      Height:        Intake/Output Summary (Last 24 hours) at 11/12/2023 0749 Last data filed at 11/12/2023 0507 Gross per 24 hour  Intake --  Output 600 ml  Net -600 ml   Filed Weights   11/08/23 0518  Weight: 93 kg    Examination:  Awake Alert,  Oriented X 3, No new F.N deficits, Normal affect Symmetrical Chest wall movement, Good air movement bilaterally, CTAB RRR,No Gallops,Rubs or new Murmurs, No Parasternal Heave +ve B.Sounds, Abd Soft, No tenderness, No rebound - guarding or rigidity. Right foot bandaged, but right lower extremity with +2 edema  pic on 6/15    Data Reviewed: I have personally reviewed following labs  and imaging studies  CBC: Recent Labs  Lab 11/08/23 0514 11/09/23 0753 11/10/23 0551 11/11/23 0513 11/12/23 0547  WBC 13.8* 10.3 7.6 6.3 5.9  HGB 11.6* 10.1* 9.5* 9.6* 9.4*  HCT 35.8* 32.0* 30.0* 30.1* 29.1*  MCV 96.0 96.1 96.5 94.4 94.5  PLT 172 144* 129* 144* 152   Basic Metabolic Panel: Recent Labs  Lab 11/08/23 0514 11/09/23 0753 11/10/23 0551 11/11/23 0513 11/12/23 0547  NA 138 138 133* 133* 132*  K 2.9* 3.9 3.4* 4.0 3.4*  CL 100 105 105 107 104  CO2 26 23 22  19* 21*  GLUCOSE 88 107* 105* 103* 75  BUN 17 18 15 14 13   CREATININE 1.05 0.78 0.81 0.73 0.64  CALCIUM  8.5* 8.2* 7.5* 7.6* 7.9*  MG  --   --  1.7  --   --   PHOS  --   --  2.2*  --   --    GFR: Estimated Creatinine Clearance: 91.3 mL/min (by C-G formula based on SCr of 0.64 mg/dL).  Liver Function Tests: Recent Labs  Lab 11/08/23 0514  AST 19  ALT 13  ALKPHOS 66  BILITOT 1.1  PROT 5.6*  ALBUMIN 2.9*   CBG: No results for input(s): GLUCAP in the last 168 hours.  Recent Results (from the past 240 hours)  Culture, blood (single) w Reflex to ID Panel     Status: None   Collection Time: 11/03/23 10:49 AM   Specimen: Blood   VB  Result Value Ref Range Status   BLOOD CULTURE, ROUTINE Final report  Final   Organism ID, Bacteria Comment  Final    Comment: No aerobic or anaerobic growth in five days.  Microscopic Examination     Status: Abnormal   Collection Time: 11/03/23 11:13 AM  Result Value Ref Range Status   WBC, UA 0-5 0 - 5 /hpf Final   RBC, Urine None seen 0 - 2 /hpf Final   Epithelial Cells (non renal) 0-10 0 - 10 /hpf Final   Casts None seen None seen /lpf Final   Crystals Present (A) N/A Final   Crystal Type Calcium  Oxalate N/A Final   Bacteria, UA None seen None seen/Few Final  Urine Culture, Reflex     Status: None   Collection Time: 11/03/23 11:13 AM  Result Value Ref Range Status   Urine Culture, Routine Final report  Final   Organism ID, Bacteria Comment  Final     Comment: Mixed urogenital flora 25,000-50,000 colony forming units per mL   Culture, blood (Routine x 2)     Status: None (Preliminary result)   Collection Time: 11/08/23  5:14 AM   Specimen: BLOOD RIGHT FOREARM  Result Value Ref Range Status   Specimen Description BLOOD RIGHT FOREARM  Final   Special Requests   Final    BOTTLES DRAWN AEROBIC AND ANAEROBIC Blood Culture adequate volume   Culture   Final    NO GROWTH 3 DAYS Performed at [facility], [address], [place], [place] [address]    Report Status PENDING  Incomplete  Culture, blood (Routine x 2)  Status: None (Preliminary result)   Collection Time: 11/08/23  5:53 AM   Specimen: BLOOD  Result Value Ref Range Status   Specimen Description BLOOD BLOOD RIGHT ARM  Final   Special Requests   Final    BOTTLES DRAWN AEROBIC AND ANAEROBIC Blood Culture adequate volume   Culture   Final    NO GROWTH 3 DAYS Performed at [facility], [address], [place], [place] [address]    Report Status PENDING  Incomplete    Radiology Studies: CT TIBIA FIBULA RIGHT W CONTRAST Result Date: 11/11/2023 CLINICAL DATA:  Right lower leg cellulitis. EXAM: CT OF THE LOWER RIGHT EXTREMITY WITH CONTRAST TECHNIQUE: Multidetector CT imaging of the right lower leg was performed according to the standard protocol following intravenous contrast administration. RADIATION DOSE REDUCTION: This exam was performed according to the departmental dose-optimization program which includes automated exposure control, adjustment of the mA and/or kV according to patient size and/or use of iterative reconstruction technique. CONTRAST:  75mL OMNIPAQUE  IOHEXOL  350 MG/ML SOLN COMPARISON:  None Available. FINDINGS: Bones/Joint/Cartilage Status post right total knee arthroplasty. No evidence of hardware loosening, acute fracture or dislocation. No evidence of osteomyelitis. Small knee joint effusion with mild synovial irregularity and small intra-articular  calcifications. No significant findings at the ankle. There are mild midfoot degenerative changes. Ligaments Suboptimally assessed by CT. Muscles and Tendons The visualized quadriceps and patellar tendons appear intact. The ankle tendons appear intact, although their distal insertions are incompletely visualized. There is some degenerative calcification within the tibialis anterior tendon. No focal muscular atrophy or abnormal enhancement identified. Soft tissues Generalized subcutaneous edema throughout the lower leg, greatest distally. No focal fluid collection, unexpected foreign body, soft tissue emphysema or suspicious enhancement identified. There are extensive vascular calcifications without evidence of acute vascular thrombosis. IMPRESSION: 1. Generalized subcutaneous edema throughout the lower leg, greatest distally, consistent with cellulitis. No focal fluid collection, soft tissue emphysema or suspicious enhancement identified. 2. No acute osseous findings. Status post right total knee arthroplasty without evidence of hardware loosening or osteomyelitis. 3. Small knee joint effusion with mild synovial irregularity and small intra-articular calcifications. Electronically Signed   By: Elmon Hagedorn M.D.   On: 11/11/2023 08:22    Scheduled Meds:  arformoterol   15 mcg Nebulization BID   atorvastatin   20 mg Oral Daily   budesonide  (PULMICORT ) nebulizer solution  0.25 mg Nebulization BID   cyanocobalamin   1,000 mcg Subcutaneous Daily   Followed by   Cecily Cohen ON 11/17/2023] cyanocobalamin   1,000 mcg Subcutaneous Weekly   diclofenac  Sodium  4 g Topical QID   diltiazem   180 mg Oral Daily   docusate sodium   100 mg Oral BID   enoxaparin  (LOVENOX ) injection  90 mg Subcutaneous Q12H   feeding supplement  237 mL Oral BID BM   pantoprazole   40 mg Oral BID   polyethylene glycol  17 g Oral Daily   pregabalin   100 mg Oral BID   tamsulosin   0.4 mg Oral QHS   Continuous Infusions:  ceFEPime  (MAXIPIME ) IV  2 g (11/12/23 0636)   vancomycin  1,250 mg (11/12/23 0223)     LOS: 4 days   Haydee Lipa, DO [facility]  To contact the attending provider between 7A-7P or the covering provider during after hours 7P-7A, please log into the web site [email or web address] and access using universal [place] password for that web site. If you do not have the password, please call the hospital operator.  11/12/2023, 7:49 AM

## 2023-11-12 NOTE — Progress Notes (Signed)
 Ok to replace k with x1 per Dr. Rudine Cos.  Ivery Marking, PharmD, BCIDP, AAHIVP, CPP Infectious Disease Pharmacist 11/12/2023 9:22 AM

## 2023-11-13 ENCOUNTER — Ambulatory Visit: Admitting: Podiatry

## 2023-11-13 DIAGNOSIS — L089 Local infection of the skin and subcutaneous tissue, unspecified: Secondary | ICD-10-CM | POA: Diagnosis not present

## 2023-11-13 LAB — CBC
HCT: 27.9 % — ABNORMAL LOW (ref 39.0–52.0)
Hemoglobin: 9.1 g/dL — ABNORMAL LOW (ref 13.0–17.0)
MCH: 30.3 pg (ref 26.0–34.0)
MCHC: 32.6 g/dL (ref 30.0–36.0)
MCV: 93 fL (ref 80.0–100.0)
Platelets: 162 10*3/uL (ref 150–400)
RBC: 3 MIL/uL — ABNORMAL LOW (ref 4.22–5.81)
RDW: 15 % (ref 11.5–15.5)
WBC: 6.1 10*3/uL (ref 4.0–10.5)
nRBC: 0 % (ref 0.0–0.2)

## 2023-11-13 LAB — CULTURE, BLOOD (ROUTINE X 2)
Culture: NO GROWTH
Culture: NO GROWTH
Special Requests: ADEQUATE
Special Requests: ADEQUATE

## 2023-11-13 LAB — BASIC METABOLIC PANEL WITH GFR
Anion gap: 11 (ref 5–15)
BUN: 11 mg/dL (ref 8–23)
CO2: 20 mmol/L — ABNORMAL LOW (ref 22–32)
Calcium: 8 mg/dL — ABNORMAL LOW (ref 8.9–10.3)
Chloride: 103 mmol/L (ref 98–111)
Creatinine, Ser: 0.65 mg/dL (ref 0.61–1.24)
GFR, Estimated: >60 mL/min (ref >60)
Glucose, Bld: 78 mg/dL (ref 70–99)
Potassium: 3.4 mmol/L — ABNORMAL LOW (ref 3.5–5.1)
Sodium: 134 mmol/L — ABNORMAL LOW (ref 135–145)

## 2023-11-13 LAB — PROCALCITONIN: Procalcitonin: 0.38 ng/mL

## 2023-11-13 MED ORDER — POTASSIUM CHLORIDE CRYS ER 20 MEQ PO TBCR
40.0000 meq | EXTENDED_RELEASE_TABLET | ORAL | Status: AC
  Administered 2023-11-13 (×2): 40 meq via ORAL
  Filled 2023-11-13 (×2): qty 2

## 2023-11-13 NOTE — Plan of Care (Signed)
   Problem: Education: Goal: Knowledge of General Education information will improve Description: Including pain rating scale, medication(s)/side effects and non-pharmacologic comfort measures Outcome: Progressing   Problem: Health Behavior/Discharge Planning: Goal: Ability to manage health-related needs will improve Outcome: Progressing   Problem: Clinical Measurements: Goal: Ability to maintain clinical measurements within normal limits will improve Outcome: Progressing   Problem: Activity: Goal: Risk for activity intolerance will decrease Outcome: Progressing   Problem: Pain Managment: Goal: General experience of comfort will improve and/or be controlled Outcome: Progressing

## 2023-11-13 NOTE — Progress Notes (Signed)
 K came back 3.4 again after yesterday. Ok to replace with total today per Dr. Rudine Cos.  Ivery Marking, PharmD, BCIDP, AAHIVP, CPP Infectious Disease Pharmacist 11/13/2023 10:21 AM

## 2023-11-13 NOTE — Progress Notes (Signed)
 PROGRESS NOTE    Levi [name]  ZOX:[id number] DOB: 05/23/1946 DOA: 11/08/2023 PCP: Gean Keels, MD    Chief Complaint  Patient presents with   fall on thinners    Brief Narrative:  Levi [name] is a 78 y.o. male with medical history significant of PAF on Eliquis , HFpEF (EF 40-45% in 02/2023), HTN, HLD, COPD, BPH, GERD, and s/p recent LLE 2nd digit/RLE  5th digit ray amputation on 08/13/2023 p/w presyncopal episode with notable sepsis 2/2 RLE cellulitis vs OM.  MRI confirmed osteomyelitis.  Orthopedic called in consult.  Assessment & Plan:   Principal Problem:   Right foot infection Active Problems:   Sepsis (HCC)  Sepsis, present on admission, due to right lower extremity cellulitis, right foot foot osteomyelitis . -Sepsis present on admission, tachycardic, with leukocytosis and elevated lactic acid -MRI of right foot significant for osteomyelitis, CT of right lower extremity significant for cellulitis - S/p recent RLE 5th ray resection - Antibiotics management per ID, continue with IV antibiotics, vancomycin , cefepime  and Flagyl . -Follow-up blood cultures, remains negative -Podiatry consulted, and ID consulted as well, after subspecialist team discussed at length with the patient, understands that IV antibiotics are unlikely to cure his infection, healing his wounds, and possibly cause more side effects, with eventual need for amputation, so he is currently wants to get rid of source of infection, and wants to proceed with right BKA, so Dr. Julio Ohm was consulted, he recommends to continue with IV antibiotics for another 2 to 3 days, and to proceed with BKA on Friday.   -PTA lyrica  100mg  BID, and percocet 5mg  q4h prn -CRP is elevated, Pro-Cal is elevated,  Syncope, resolved -Most likely due to above, continue with telemetry monitoring, continue with IV fluids  PAF -Continue with Eliquis  and diltiazem  - Eliquis  changed to Lovenox  in anticipation of surgery, hold Lovenox   the night before surgery and anticoagulation postoperatively  Chronic systolic CHF -Is on IV fluids, monitor closely, Lasix  remains on hold    HTN -PTA diltiazem  per above -HOLD pta Imdur  and Lasix    COPD -Brovana /pulmicort  nebs BID + Duonebs prn -Consider combination inhaler such as Breztri or Symbicort at discharge   BPH -PTA tamsulosin    Tension headache Left neck pain - Patient with tension headache, and left neck pain secondary to muscle spasms . Fall-continue with Fioricet. - Voltaren  gel, heat compress. - The head and cervical spine with no acute abnormalities  DVT prophylaxis: on Eliquis  Code Status: Full Family Communication: None present Disposition: To be determined  Status is: Inpatient  Consultants:  Podiatry   Subjective: No acute issues or events reported overnight, at bedside patient is complaining of tension headache and left neck spasms/pain.  Otherwise denies nausea vomiting diarrhea constipation any fevers chills or shortness of breath  Objective: Vitals:   11/13/23 0423 11/13/23 0424 11/13/23 0425 11/13/23 0426  BP: 135/76     Pulse:   (!) 116 96  Resp: 14 13 13 14   Temp: 99 F (37.2 C)     TempSrc: Oral     SpO2: 97%  95% 97%  Weight:      Height:        Intake/Output Summary (Last 24 hours) at 11/13/2023 0714 Last data filed at 11/13/2023 0555 Gross per 24 hour  Intake 1050 ml  Output 600 ml  Net 450 ml   Filed Weights   11/08/23 0518  Weight: 93 kg    Examination:  Awake Alert, Oriented X 3, No new F.N  deficits, Normal affect Symmetrical Chest wall movement, Good air movement bilaterally, CTAB RRR,No Gallops,Rubs or new Murmurs, No Parasternal Heave +ve B.Sounds, Abd Soft, No tenderness, No rebound - guarding or rigidity. Right foot bandaged, but right lower extremity with +2 edema  pic on 6/15    Data Reviewed: I have personally reviewed following labs and imaging studies  CBC: Recent Labs  Lab 11/09/23 0753  11/10/23 0551 11/11/23 0513 11/12/23 0547 11/13/23 0627  WBC 10.3 7.6 6.3 5.9 6.1  HGB 10.1* 9.5* 9.6* 9.4* 9.1*  HCT 32.0* 30.0* 30.1* 29.1* 27.9*  MCV 96.1 96.5 94.4 94.5 93.0  PLT 144* 129* 144* 152 162   Basic Metabolic Panel: Recent Labs  Lab 11/09/23 0753 11/10/23 0551 11/11/23 0513 11/12/23 0547 11/13/23 0627  NA 138 133* 133* 132* 134*  K 3.9 3.4* 4.0 3.4* 3.4*  CL 105 105 107 104 103  CO2 23 22 19* 21* 20*  GLUCOSE 107* 105* 103* 75 78  BUN 18 15 14 13 11   CREATININE 0.78 0.81 0.73 0.64 0.65  CALCIUM  8.2* 7.5* 7.6* 7.9* 8.0*  MG  --  1.7  --   --   --   PHOS  --  2.2*  --   --   --    GFR: Estimated Creatinine Clearance: 91.3 mL/min (by C-G formula based on SCr of 0.65 mg/dL).  Liver Function Tests: Recent Labs  Lab 11/08/23 0514  AST 19  ALT 13  ALKPHOS 66  BILITOT 1.1  PROT 5.6*  ALBUMIN 2.9*   CBG: No results for input(s): GLUCAP in the last 168 hours.  Recent Results (from the past 240 hours)  Culture, blood (single) w Reflex to ID Panel     Status: None   Collection Time: 11/03/23 10:49 AM   Specimen: Blood   VB  Result Value Ref Range Status   BLOOD CULTURE, ROUTINE Final report  Final   Organism ID, Bacteria Comment  Final    Comment: No aerobic or anaerobic growth in five days.  Microscopic Examination     Status: Abnormal   Collection Time: 11/03/23 11:13 AM  Result Value Ref Range Status   WBC, UA 0-5 0 - 5 /hpf Final   RBC, Urine None seen 0 - 2 /hpf Final   Epithelial Cells (non renal) 0-10 0 - 10 /hpf Final   Casts None seen None seen /lpf Final   Crystals Present (A) N/A Final   Crystal Type Calcium  Oxalate N/A Final   Bacteria, UA None seen None seen/Few Final  Urine Culture, Reflex     Status: None   Collection Time: 11/03/23 11:13 AM  Result Value Ref Range Status   Urine Culture, Routine Final report  Final   Organism ID, Bacteria Comment  Final    Comment: Mixed urogenital flora 25,000-50,000 colony forming units  per mL   Culture, blood (Routine x 2)     Status: None (Preliminary result)   Collection Time: 11/08/23  5:14 AM   Specimen: BLOOD RIGHT FOREARM  Result Value Ref Range Status   Specimen Description BLOOD RIGHT FOREARM  Final   Special Requests   Final    BOTTLES DRAWN AEROBIC AND ANAEROBIC Blood Culture adequate volume   Culture   Final    NO GROWTH 4 DAYS Performed at [facility], [address], [place], [place] [address]    Report Status PENDING  Incomplete  Culture, blood (Routine x 2)     Status: None (Preliminary result)  Collection Time: 11/08/23  5:53 AM   Specimen: BLOOD  Result Value Ref Range Status   Specimen Description BLOOD BLOOD RIGHT ARM  Final   Special Requests   Final    BOTTLES DRAWN AEROBIC AND ANAEROBIC Blood Culture adequate volume   Culture   Final    NO GROWTH 4 DAYS Performed at [facility], [address], [place], [place] [address]    Report Status PENDING  Incomplete    Radiology Studies: No results found.   Scheduled Meds:  arformoterol   15 mcg Nebulization BID   atorvastatin   20 mg Oral Daily   budesonide  (PULMICORT ) nebulizer solution  0.25 mg Nebulization BID   cyanocobalamin   1,000 mcg Subcutaneous Daily   Followed by   Cecily Cohen ON 11/17/2023] cyanocobalamin   1,000 mcg Subcutaneous Weekly   diclofenac  Sodium  4 g Topical QID   diltiazem   180 mg Oral Daily   docusate sodium   100 mg Oral BID   enoxaparin  (LOVENOX ) injection  90 mg Subcutaneous Q12H   feeding supplement  237 mL Oral BID BM   pantoprazole   40 mg Oral BID   polyethylene glycol  17 g Oral Daily   pregabalin   100 mg Oral BID   tamsulosin   0.4 mg Oral QHS   Continuous Infusions:  ceFEPime  (MAXIPIME ) IV 2 g (11/13/23 0525)   vancomycin  1,250 mg (11/12/23 2224)     LOS: 5 days   Haydee Lipa, DO [facility]  To contact the attending provider between 7A-7P or the covering provider during after hours 7P-7A, please log into the web site  [email or web address] and access using universal [place] password for that web site. If you do not have the password, please call the hospital operator.  11/13/2023, 7:14 AM

## 2023-11-13 NOTE — Progress Notes (Signed)
@   2215 - Messaged Dr. Alla Isaacs to ask for tele order renewal.

## 2023-11-13 NOTE — Progress Notes (Signed)
 Physical Therapy Treatment Patient Details Name: Levi [name] MRN: [id number] DOB: 12-Apr-1946 Today's Date: 11/13/2023   History of Present Illness 78 yo male presents to [facility] on 6/14 with mechanical fall + head trauma. Workup for septic shock due to RLE cellulitis and possible osteomyelitis. CTH and CT Cspine negative for acute findings. Podiatry consult 6/16 with planned BKA on Friday, June 20. PMHx:  admission 03/2023 for RLE wound and cellulitis, agent orange exposure, afib, neuropathy, osteoporosis, CHF, COPD, HLD and venous insufficiency of both lower extremities, PAF, L 2nd and 5th toe amputations 07/2023, recent RLE 5th ray resection, ACDF 2014 and 2023, lumbar fusion 2018, x5 joint replacements.    PT Comments  Pt tolerated treatment well today. Pt today was able to transfer to chair with +2 Min/Mod A RW and perform seated therex. Pt seemed to have no recollection of previous PT session yesterday along with extensive education provided by PTA. Reiterated education of what to expect with procedure tomorrow as well as HEP. No change in DC/DME recs at this time. PT will continue to follow.     If plan is discharge home, recommend the following: A lot of help with walking and/or transfers;A lot of help with bathing/dressing/bathroom;Assist for transportation;Help with stairs or ramp for entrance;Assistance with cooking/housework   Can travel by Doctor, hospital (measurements PT);Wheelchair cushion (measurements PT);Other (comment) (Amputee wheelchair with removable arm and leg rests; likely to need slide board, hospital bed, drop arm BSC if he goes directly home after amputation surgery)    Recommendations for Other Services Rehab consult     Precautions / Restrictions Precautions Precautions: Fall Recall of Precautions/Restrictions: Intact Required Braces or Orthoses: Other Brace Other Brace: in post op shoe right foot Restrictions Weight  Bearing Restrictions Per Provider Order: Yes RLE Weight Bearing Per Provider Order: Weight bearing as tolerated Other Position/Activity Restrictions: post op shoe     Mobility  Bed Mobility Overal bed mobility: Needs Assistance Bed Mobility: Supine to Sit     Supine to sit: Contact guard     General bed mobility comments: Increased time however no physical assistance.    Transfers Overall transfer level: Needs assistance Equipment used: Rolling walker (2 wheels) Transfers: Sit to/from Stand Sit to Stand: From elevated surface, +2 safety/equipment, Mod assist           General transfer comment: +2 Mod A to power up    Ambulation/Gait Ambulation/Gait assistance: Min assist, +2 safety/equipment Gait Distance (Feet): 5 Feet Assistive device: Rolling walker (2 wheels) Gait Pattern/deviations: Decreased step length - right, Decreased step length - left, Decreased stance time - right, Decreased weight shift to right, Trunk flexed, Wide base of support, Step-to pattern Gait velocity: decreased     General Gait Details: +2 Min A to ambulate short distance to chair   Stairs             Wheelchair Mobility     Tilt Bed    Modified Rankin (Stroke Patients Only)       Balance Overall balance assessment: Needs assistance, History of Falls Sitting-balance support: Bilateral upper extremity supported, Feet supported   Sitting balance - Comments: pt up in recliner   Standing balance support: During functional activity, Bilateral upper extremity supported, Reliant on assistive device for balance Standing balance-[name] Scale: Poor Standing balance comment: relies on B UE support  Communication Communication Communication: No apparent difficulties  Cognition Arousal: Alert Behavior During Therapy: WFL for tasks assessed/performed   PT - Cognitive impairments: Safety/Judgement, Problem solving                        PT - Cognition Comments: HEP and post-op amputee handout brought to room to reinforce importance of continued activity and strengthening in supine and chair postures given upcoming surgery Following commands: Intact      Cueing Cueing Techniques: Verbal cues  Exercises General Exercises - Upper Extremity Chair Push Up: AROM, Strengthening, Both, 10 reps, Seated    General Comments General comments (skin integrity, edema, etc.): VSS      Pertinent Vitals/Pain Pain Assessment Pain Assessment: Faces Faces Pain Scale: Hurts little more Pain Location: R LE Pain Descriptors / Indicators: Grimacing, Guarding, Discomfort, Sore Pain Intervention(s): Limited activity within patient's tolerance, Monitored during session    Home Living                          Prior Function            PT Goals (current goals can now be found in the care plan section) Acute Rehab PT Goals Patient Stated Goal: To be moving more independently after my surgery Progress towards PT goals: Progressing toward goals    Frequency    Min 3X/week      PT Plan      Co-evaluation              AM-PAC PT 6 Clicks Mobility   Outcome Measure  Help needed turning from your back to your side while in a flat bed without using bedrails?: A Little Help needed moving from lying on your back to sitting on the side of a flat bed without using bedrails?: A Lot (a little using hospital bed rail/features) Help needed moving to and from a bed to a chair (including a wheelchair)?: A Lot Help needed standing up from a chair using your arms (e.g., wheelchair or bedside chair)?: A Lot Help needed to walk in hospital room?: Total (<58ft) Help needed climbing 3-5 steps with a railing? : Total 6 Click Score: 11    End of Session Equipment Utilized During Treatment: Gait belt Activity Tolerance: Patient tolerated treatment well Patient left: in chair;with call bell/phone within reach;with chair alarm  set;with family/visitor present Nurse Communication: Mobility status;Need for lift equipment;Other (comment) (rec +2 Stedy for nursing staff to transfer him for safety given lines and pt fatigue/poor RW mgmt) PT Visit Diagnosis: Other abnormalities of gait and mobility (R26.89);Muscle weakness (generalized) (M62.81)     Time: 6045-4098 PT Time Calculation (min) (ACUTE ONLY): 21 min  Charges:    $Therapeutic Activity: 8-22 mins PT General Charges $$ ACUTE PT VISIT: 1 Visit                     Rodgers Clack, PT, DPT Acute Rehab Services [phone]    Laureano Hetzer 11/13/2023, 4:27 PM

## 2023-11-14 ENCOUNTER — Encounter (HOSPITAL_COMMUNITY)
Admission: EM | Disposition: A | Discharge status: Other Acute Inpatient Hospital | Payer: Self-pay | Source: Home / Self Care | Attending: Internal Medicine

## 2023-11-14 ENCOUNTER — Inpatient Hospital Stay (HOSPITAL_COMMUNITY): Payer: Self-pay

## 2023-11-14 ENCOUNTER — Encounter (HOSPITAL_COMMUNITY): Payer: Self-pay | Admitting: Family Medicine

## 2023-11-14 ENCOUNTER — Other Ambulatory Visit: Payer: Self-pay

## 2023-11-14 DIAGNOSIS — L089 Local infection of the skin and subcutaneous tissue, unspecified: Secondary | ICD-10-CM | POA: Diagnosis not present

## 2023-11-14 DIAGNOSIS — M86271 Subacute osteomyelitis, right ankle and foot: Secondary | ICD-10-CM | POA: Diagnosis not present

## 2023-11-14 DIAGNOSIS — M869 Osteomyelitis, unspecified: Secondary | ICD-10-CM

## 2023-11-14 DIAGNOSIS — I110 Hypertensive heart disease with heart failure: Secondary | ICD-10-CM

## 2023-11-14 DIAGNOSIS — I4891 Unspecified atrial fibrillation: Secondary | ICD-10-CM

## 2023-11-14 DIAGNOSIS — I5022 Chronic systolic (congestive) heart failure: Secondary | ICD-10-CM

## 2023-11-14 HISTORY — PX: AMPUTATION: SHX166

## 2023-11-14 LAB — CBC
HCT: 30.9 % — ABNORMAL LOW (ref 39.0–52.0)
Hemoglobin: 10.1 g/dL — ABNORMAL LOW (ref 13.0–17.0)
MCH: 30.8 pg (ref 26.0–34.0)
MCHC: 32.7 g/dL (ref 30.0–36.0)
MCV: 94.2 fL (ref 80.0–100.0)
Platelets: 180 10*3/uL (ref 150–400)
RBC: 3.28 MIL/uL — ABNORMAL LOW (ref 4.22–5.81)
RDW: 15.1 % (ref 11.5–15.5)
WBC: 7.3 10*3/uL (ref 4.0–10.5)
nRBC: 0 % (ref 0.0–0.2)

## 2023-11-14 LAB — BASIC METABOLIC PANEL WITH GFR
Anion gap: 8 (ref 5–15)
BUN: 15 mg/dL (ref 8–23)
CO2: 22 mmol/L (ref 22–32)
Calcium: 8.2 mg/dL — ABNORMAL LOW (ref 8.9–10.3)
Chloride: 106 mmol/L (ref 98–111)
Creatinine, Ser: 0.67 mg/dL (ref 0.61–1.24)
GFR, Estimated: >60 mL/min (ref >60)
Glucose, Bld: 89 mg/dL (ref 70–99)
Potassium: 4.2 mmol/L (ref 3.5–5.1)
Sodium: 136 mmol/L (ref 135–145)

## 2023-11-14 SURGERY — AMPUTATION BELOW KNEE
Anesthesia: General | Site: Knee | Laterality: Right

## 2023-11-14 MED ORDER — OXYCODONE HCL 5 MG/5ML PO SOLN: 5.0000 mg | Freq: Once | ORAL | Status: AC | PRN

## 2023-11-14 MED ORDER — SODIUM CHLORIDE 0.9 % IV SOLN
12.5000 mg | INTRAVENOUS | Status: DC | PRN
  Filled 2023-11-14: qty 0.5

## 2023-11-14 MED ORDER — OXYCODONE HCL 5 MG PO TABS
5.0000 mg | ORAL_TABLET | Freq: Once | ORAL | Status: AC | PRN
  Administered 2023-11-14: 5 mg via ORAL

## 2023-11-14 MED ORDER — CEFAZOLIN SODIUM-DEXTROSE 2-4 GM/100ML-% IV SOLN
INTRAVENOUS | Status: AC
  Filled 2023-11-14: qty 100

## 2023-11-14 MED ORDER — CEFAZOLIN SODIUM-DEXTROSE 2-4 GM/100ML-% IV SOLN
2.0000 g | INTRAVENOUS | Status: AC
  Administered 2023-11-14: 2 g via INTRAVENOUS

## 2023-11-14 MED ORDER — HYDRALAZINE HCL 20 MG/ML IJ SOLN: 5.0000 mg | INTRAMUSCULAR | Status: DC | PRN

## 2023-11-14 MED ORDER — LIDOCAINE 2% (20 MG/ML) 5 ML SYRINGE
INTRAMUSCULAR | Status: DC | PRN
  Administered 2023-11-14: 40 mg via INTRAVENOUS

## 2023-11-14 MED ORDER — PHENYLEPHRINE 80 MCG/ML (10ML) SYRINGE FOR IV PUSH (FOR BLOOD PRESSURE SUPPORT)
PREFILLED_SYRINGE | INTRAVENOUS | Status: DC | PRN
  Administered 2023-11-14: 160 ug via INTRAVENOUS

## 2023-11-14 MED ORDER — PHENYLEPHRINE HCL-NACL 20-0.9 MG/250ML-% IV SOLN
INTRAVENOUS | Status: DC | PRN
  Administered 2023-11-14: 40 ug/min via INTRAVENOUS

## 2023-11-14 MED ORDER — LABETALOL HCL 5 MG/ML IV SOLN: 10.0000 mg | INTRAVENOUS | Status: DC | PRN

## 2023-11-14 MED ORDER — PROPOFOL 10 MG/ML IV BOLUS
INTRAVENOUS | Status: AC
  Filled 2023-11-14: qty 20

## 2023-11-14 MED ORDER — ONDANSETRON HCL 4 MG/2ML IJ SOLN
INTRAMUSCULAR | Status: DC | PRN
  Administered 2023-11-14: 4 mg via INTRAVENOUS

## 2023-11-14 MED ORDER — GUAIFENESIN-DM 100-10 MG/5ML PO SYRP: 15.0000 mL | ORAL_SOLUTION | ORAL | Status: DC | PRN

## 2023-11-14 MED ORDER — LACTATED RINGERS IV SOLN: INTRAVENOUS | Status: DC

## 2023-11-14 MED ORDER — PROPOFOL 10 MG/ML IV BOLUS
INTRAVENOUS | Status: DC | PRN
  Administered 2023-11-14: 150 mg via INTRAVENOUS

## 2023-11-14 MED ORDER — 0.9 % SODIUM CHLORIDE (POUR BTL) OPTIME
TOPICAL | Status: DC | PRN
  Administered 2023-11-14: 1000 mL

## 2023-11-14 MED ORDER — HYDROMORPHONE HCL 1 MG/ML IJ SOLN
INTRAMUSCULAR | Status: AC
  Filled 2023-11-14: qty 1

## 2023-11-14 MED ORDER — MAGNESIUM SULFATE 2 GM/50ML IV SOLN: 2.0000 g | Freq: Every day | INTRAVENOUS | Status: DC | PRN

## 2023-11-14 MED ORDER — ORAL CARE MOUTH RINSE: 15.0000 mL | Freq: Once | OROMUCOSAL | Status: AC

## 2023-11-14 MED ORDER — CHLORHEXIDINE GLUCONATE 0.12 % MT SOLN: 15.0000 mL | Freq: Once | OROMUCOSAL | Status: AC

## 2023-11-14 MED ORDER — ALUM & MAG HYDROXIDE-SIMETH 200-200-20 MG/5ML PO SUSP: 15.0000 mL | ORAL | Status: DC | PRN

## 2023-11-14 MED ORDER — OXYCODONE HCL 5 MG PO TABS
5.0000 mg | ORAL_TABLET | ORAL | Status: DC | PRN
  Administered 2023-11-15 (×2): 10 mg via ORAL
  Filled 2023-11-14 (×2): qty 2

## 2023-11-14 MED ORDER — HYDROMORPHONE HCL 1 MG/ML IJ SOLN
0.5000 mg | INTRAMUSCULAR | Status: DC | PRN
  Administered 2023-11-14: 1 mg via INTRAVENOUS
  Filled 2023-11-14: qty 1

## 2023-11-14 MED ORDER — CHLORHEXIDINE GLUCONATE 0.12 % MT SOLN
OROMUCOSAL | Status: AC
  Administered 2023-11-14: 15 mL via OROMUCOSAL
  Filled 2023-11-14: qty 15

## 2023-11-14 MED ORDER — POVIDONE-IODINE 10 % EX SWAB
2.0000 | Freq: Once | CUTANEOUS | Status: AC
  Administered 2023-11-14: 2 via TOPICAL

## 2023-11-14 MED ORDER — LACTATED RINGERS IV SOLN: INTRAVENOUS | Status: DC | PRN

## 2023-11-14 MED ORDER — VASHE WOUND IRRIGATION OPTIME
TOPICAL | Status: DC | PRN
  Administered 2023-11-14: 34 [oz_av]

## 2023-11-14 MED ORDER — FENTANYL CITRATE (PF) 250 MCG/5ML IJ SOLN
INTRAMUSCULAR | Status: AC
  Filled 2023-11-14: qty 5

## 2023-11-14 MED ORDER — METOPROLOL TARTRATE 5 MG/5ML IV SOLN: 2.0000 mg | INTRAVENOUS | Status: DC | PRN

## 2023-11-14 MED ORDER — HYDROMORPHONE HCL 1 MG/ML IJ SOLN
0.2500 mg | INTRAMUSCULAR | Status: DC | PRN
  Administered 2023-11-14 (×3): 0.25 mg via INTRAVENOUS
  Administered 2023-11-14 (×2): 0.5 mg via INTRAVENOUS

## 2023-11-14 MED ORDER — ONDANSETRON HCL 4 MG/2ML IJ SOLN: 4.0000 mg | Freq: Four times a day (QID) | INTRAMUSCULAR | Status: DC | PRN

## 2023-11-14 MED ORDER — PHENOL 1.4 % MT LIQD: 1.0000 | OROMUCOSAL | Status: DC | PRN

## 2023-11-14 MED ORDER — FENTANYL CITRATE (PF) 250 MCG/5ML IJ SOLN
INTRAMUSCULAR | Status: DC | PRN
  Administered 2023-11-14: 100 ug via INTRAVENOUS
  Administered 2023-11-14 (×2): 50 ug via INTRAVENOUS

## 2023-11-14 MED ORDER — CHLORHEXIDINE GLUCONATE 4 % EX SOLN
60.0000 mL | Freq: Once | CUTANEOUS | Status: DC
  Administered 2023-11-14: 4 via TOPICAL

## 2023-11-14 MED ORDER — POTASSIUM CHLORIDE CRYS ER 20 MEQ PO TBCR: 20.0000 meq | EXTENDED_RELEASE_TABLET | Freq: Every day | ORAL | Status: DC | PRN

## 2023-11-14 MED ORDER — AMISULPRIDE (ANTIEMETIC) 5 MG/2ML IV SOLN
10.0000 mg | Freq: Once | INTRAVENOUS | Status: DC | PRN
Start: 2023-11-14 — End: 2023-11-14

## 2023-11-14 MED ORDER — OXYCODONE HCL 5 MG PO TABS: 10.0000 mg | ORAL_TABLET | ORAL | Status: DC | PRN

## 2023-11-14 MED ORDER — DEXAMETHASONE SODIUM PHOSPHATE 10 MG/ML IJ SOLN
INTRAMUSCULAR | Status: DC | PRN
  Administered 2023-11-14: 10 mg via INTRAVENOUS

## 2023-11-14 MED ORDER — ACETAMINOPHEN 325 MG PO TABS
325.0000 mg | ORAL_TABLET | Freq: Four times a day (QID) | ORAL | Status: DC | PRN
  Administered 2023-11-15: 500 mg via ORAL

## 2023-11-14 MED ORDER — ACETAMINOPHEN 500 MG PO TABS
1000.0000 mg | ORAL_TABLET | Freq: Four times a day (QID) | ORAL | Status: AC
Start: 2023-11-14 — End: 2023-11-15
  Administered 2023-11-14 – 2023-11-15 (×2): 1000 mg via ORAL
  Filled 2023-11-14 (×3): qty 2

## 2023-11-14 MED ORDER — PROPOFOL 1000 MG/100ML IV EMUL
INTRAVENOUS | Status: AC
  Filled 2023-11-14: qty 100

## 2023-11-14 MED ORDER — OXYCODONE HCL 5 MG PO TABS
ORAL_TABLET | ORAL | Status: AC
  Filled 2023-11-14: qty 1

## 2023-11-14 SURGICAL SUPPLY — 36 items
BAG COUNTER SPONGE SURGICOUNT (BAG) IMPLANT
BLADE SAW RECIP 87.9 MT (BLADE) ×1 IMPLANT
BLADE SURG 21 STRL SS (BLADE) ×1 IMPLANT
BNDG COHESIVE 6X5 TAN ST LF (GAUZE/BANDAGES/DRESSINGS) IMPLANT
CANISTER WOUND CARE 500ML ATS (WOUND CARE) ×1 IMPLANT
CANISTER WOUNDNEG PRESSURE 500 (CANNISTER) IMPLANT
CLEANER TIP ELECTROSURG 2X2 (MISCELLANEOUS) IMPLANT
COVER SURGICAL LIGHT HANDLE (MISCELLANEOUS) ×1 IMPLANT
CUFF TRNQT CYL 34X4.125X (TOURNIQUET CUFF) ×1 IMPLANT
DRAPE DERMATAC (DRAPES) IMPLANT
DRAPE INCISE IOBAN 66X45 STRL (DRAPES) ×1 IMPLANT
DRAPE U-SHAPE 47X51 STRL (DRAPES) ×1 IMPLANT
DRESSING PREVENA PLUS CUSTOM (GAUZE/BANDAGES/DRESSINGS) ×1 IMPLANT
DURAPREP 26ML APPLICATOR (WOUND CARE) ×1 IMPLANT
ELECTRODE REM PT RTRN 9FT ADLT (ELECTROSURGICAL) ×1 IMPLANT
GLOVE BIOGEL PI IND STRL 9 (GLOVE) ×1 IMPLANT
GLOVE SURG ORTHO 9.0 STRL STRW (GLOVE) ×1 IMPLANT
GOWN STRL REUS W/ TWL XL LVL3 (GOWN DISPOSABLE) ×2 IMPLANT
GRAFT SKIN WND MICRO 38 (Tissue) IMPLANT
KIT BASIN OR (CUSTOM PROCEDURE TRAY) ×1 IMPLANT
KIT TURNOVER KIT B (KITS) ×1 IMPLANT
MANIFOLD NEPTUNE II (INSTRUMENTS) ×1 IMPLANT
NS IRRIG 1000ML POUR BTL (IV SOLUTION) ×1 IMPLANT
PACK ORTHO EXTREMITY (CUSTOM PROCEDURE TRAY) ×1 IMPLANT
PAD ARMBOARD POSITIONER FOAM (MISCELLANEOUS) ×1 IMPLANT
PENCIL BUTTON HOLSTER BLD 10FT (ELECTRODE) IMPLANT
PREVENA RESTOR ARTHOFORM 46X30 (CANNISTER) ×1 IMPLANT
SPONGE T-LAP 18X18 ~~LOC~~+RFID (SPONGE) IMPLANT
STAPLER SKIN PROX 35W (STAPLE) IMPLANT
STOCKINETTE IMPERVIOUS LG (DRAPES) ×1 IMPLANT
SUT ETHILON 2 0 PSLX (SUTURE) IMPLANT
SUT SILK 2-0 18XBRD TIE 12 (SUTURE) ×1 IMPLANT
SUT VIC AB 1 CTX 27 (SUTURE) ×2 IMPLANT
TOWEL GREEN STERILE (TOWEL DISPOSABLE) ×1 IMPLANT
TUBE CONNECTING 12X1/4 (SUCTIONS) ×1 IMPLANT
YANKAUER SUCT BULB TIP NO VENT (SUCTIONS) ×1 IMPLANT

## 2023-11-14 NOTE — Transfer of Care (Signed)
 Immediate Anesthesia Transfer of Care Note  Patient: Levi [name]  Procedure(s) Performed: AMPUTATION BELOW KNEE (Right: Knee)  Patient Location: PACU  Anesthesia Type:General  Level of Consciousness: awake, alert , and oriented  Airway & Oxygen Therapy: Patient Spontanous Breathing  Post-op Assessment: Report given to RN, Post -op Vital signs reviewed and stable, and Patient moving all extremities  Post vital signs: Reviewed and stable  Last Vitals:  Vitals Value Taken Time  BP 126/69 11/14/23 14:54  Temp    Pulse 86 11/14/23 14:57  Resp 16 11/14/23 14:57  SpO2 93 % 11/14/23 14:57  Vitals shown include unfiled device data.  Last Pain:  Vitals:   11/14/23 1232  TempSrc: (P) Oral  PainSc:       Patients Stated Pain Goal: 0 (11/12/23 1021)  Complications: No notable events documented.

## 2023-11-14 NOTE — Interval H&P Note (Signed)
 History and Physical Interval Note:  11/14/2023 6:40 AM  Levi [name]  has presented today for surgery, with the diagnosis of Osteomyelitis Right Foot.  The various methods of treatment have been discussed with the patient and family. After consideration of risks, benefits and other options for treatment, the patient has consented to  Procedure(s): AMPUTATION BELOW KNEE (Right) as a surgical intervention.  The patient's history has been reviewed, patient examined, no change in status, stable for surgery.  I have reviewed the patient's chart and labs.  Questions were answered to the patient's satisfaction.     Quintarius Ferns V Makayla Confer

## 2023-11-14 NOTE — Anesthesia Procedure Notes (Signed)
 Procedure Name: LMA Insertion Date/Time: 11/14/2023 2:05 PM  Performed by: Pasty Bongo, CRNAPre-anesthesia Checklist: Patient identified, Emergency Drugs available, Suction available and Patient being monitored Patient Re-evaluated:Patient Re-evaluated prior to induction Oxygen Delivery Method: Circle System Utilized Preoxygenation: Pre-oxygenation with 100% oxygen Induction Type: IV induction Ventilation: Mask ventilation without difficulty LMA: LMA inserted LMA Size: 5.0 Number of attempts: 1 Airway Equipment and Method: Bite block Placement Confirmation: positive ETCO2 Tube secured with: Tape Dental Injury: Teeth and Oropharynx as per pre-operative assessment

## 2023-11-14 NOTE — Progress Notes (Signed)
 Called for report on patient scheduled for surgery. Was told nurse was in another patient's room. Msged nurse and asked her to give me a call back.

## 2023-11-14 NOTE — Plan of Care (Signed)

## 2023-11-14 NOTE — Anesthesia Preprocedure Evaluation (Signed)
 Anesthesia Evaluation  Patient identified by MRN, date of birth, ID band Patient awake    Reviewed: Allergy  & Precautions, NPO status , Patient's Chart, lab work & pertinent test results  History of Anesthesia Complications Negative for: history of anesthetic complications  Airway Mallampati: I  TM Distance: >3 FB Neck ROM: Full    Dental  (+) Dental Advisory Given   Pulmonary neg shortness of breath, asthma , neg sleep apnea, COPD (last used inhaler ~1 week ago),  COPD inhaler, neg recent URI   Pulmonary exam normal breath sounds clear to auscultation       Cardiovascular hypertension (ISMN), Pt. on medications (-) angina +CHF (EF 40-45%)  (-) Past MI, (-) Cardiac Stents and (-) CABG + dysrhythmias (on diltiazem  and Eliquis ) Atrial Fibrillation + Valvular Problems/Murmurs (mild) MR  Rhythm:Regular Rate:Normal  HLD  TEE 03/24/2023: IMPRESSIONS    1. Left ventricular ejection fraction, by estimation, is 40 to 45%. The  left ventricle has mildly decreased function. The left ventricle  demonstrates global hypokinesis. The left ventricular internal cavity size  was mildly dilated.   2. Right ventricular systolic function is normal. The right ventricular  size is normal.   3. No left atrial/left atrial appendage thrombus was detected.   4. The mitral valve is grossly normal. Mild mitral valve regurgitation.   5. The aortic valve is tricuspid. Aortic valve regurgitation trivial to  mild. No aortic stenosis is present.   6. Agitated saline contrast bubble study was negative, with no evidence  of any interatrial shunt.     Neuro/Psych  Headaches, neg Seizures vertigo  Neuromuscular disease (neuropathy, cervical radiculopathy s/p ACDF, lumbar stenosis s/p T12-S1 fusion)    GI/Hepatic Neg liver ROS, hiatal hernia,GERD  Medicated,,Diverticulitis    Endo/Other  neg diabetesHypothyroidism    Renal/GU negative Renal ROS      Musculoskeletal  (+) Arthritis , Osteoarthritis,  Osteoporosis    Abdominal   Peds  Hematology  (+) Blood dyscrasia, anemia Lab Results      Component                Value               Date                      WBC                      12.9 (H)            10/08/2023                HGB                      12.1 (L)            10/08/2023                HCT                      37.0 (L)            10/08/2023                MCV                      98 (H)              10/08/2023  PLT                      145 (L)             10/08/2023              Anesthesia Other Findings Last Eliquis : 10/12/2023  Takes prednisone  10 mg BID, took this morning  Reproductive/Obstetrics                             Anesthesia Physical Anesthesia Plan  ASA: 3  Anesthesia Plan: General   Post-op Pain Management:    Induction: Intravenous  PONV Risk Score and Plan: 2 and Ondansetron , Treatment may vary due to age or medical condition and Midazolam   Airway Management Planned: LMA  Additional Equipment:   Intra-op Plan:   Post-operative Plan:   Informed Consent: I have reviewed the patients History and Physical, chart, labs and discussed the procedure including the risks, benefits and alternatives for the proposed anesthesia with the patient or authorized representative who has indicated his/her understanding and acceptance.     Dental advisory given  Plan Discussed with: CRNA and Anesthesiologist  Anesthesia Plan Comments: ( )        Anesthesia Quick Evaluation

## 2023-11-14 NOTE — Plan of Care (Signed)
  Problem: Education: Goal: Knowledge of General Education information will improve Description: Including pain rating scale, medication(s)/side effects and non-pharmacologic comfort measures Outcome: Progressing   Problem: Health Behavior/Discharge Planning: Goal: Ability to manage health-related needs will improve Outcome: Progressing   Problem: Clinical Measurements: Goal: Ability to maintain clinical measurements within normal limits will improve Outcome: Progressing Goal: Will remain free from infection Outcome: Progressing Goal: Diagnostic test results will improve Outcome: Progressing Goal: Respiratory complications will improve Outcome: Progressing Goal: Cardiovascular complication will be avoided Outcome: Progressing   Problem: Activity: Goal: Risk for activity intolerance will decrease Outcome: Progressing   Problem: Nutrition: Goal: Adequate nutrition will be maintained Outcome: Progressing   Problem: Coping: Goal: Level of anxiety will decrease Outcome: Progressing   Problem: Elimination: Goal: Will not experience complications related to bowel motility Outcome: Progressing Goal: Will not experience complications related to urinary retention Outcome: Progressing   Problem: Pain Managment: Goal: General experience of comfort will improve and/or be controlled Outcome: Progressing   Problem: Safety: Goal: Ability to remain free from injury will improve Outcome: Progressing   Problem: Skin Integrity: Goal: Risk for impaired skin integrity will decrease Outcome: Progressing   Problem: Education: Goal: Knowledge of the prescribed therapeutic regimen will improve Outcome: Progressing Goal: Ability to verbalize activity precautions or restrictions will improve Outcome: Progressing Goal: Understanding of discharge needs will improve Outcome: Progressing   Problem: Activity: Goal: Ability to perform//tolerate increased activity and mobilize with assistive  devices will improve Outcome: Progressing   Problem: Clinical Measurements: Goal: Postoperative complications will be avoided or minimized Outcome: Progressing   Problem: Self-Care: Goal: Ability to meet self-care needs will improve Outcome: Progressing   Problem: Self-Concept: Goal: Ability to maintain and perform role responsibilities to the fullest extent possible will improve Outcome: Progressing   Problem: Pain Management: Goal: Pain level will decrease with appropriate interventions Outcome: Progressing   Problem: Skin Integrity: Goal: Demonstration of wound healing without infection will improve Outcome: Progressing

## 2023-11-14 NOTE — Op Note (Signed)
 11/08/2023 - 11/14/2023  2:46 PM  PATIENT:  Levi [name]    PRE-OPERATIVE DIAGNOSIS:  Osteomyelitis Right Foot  POST-OPERATIVE DIAGNOSIS:  Same  PROCEDURE:  AMPUTATION BELOW KNEE Application of Kerecis micro graft 38 cm. Application of Prevena customizable and Prevena arthroform wound VAC dressings Application of Vive Wear stump shrinker and the [employer] limb protector  SURGEON:  Levi Ford, MD  ANESTHESIA:   General  PREOPERATIVE INDICATIONS:  Levi [name] is a  78 y.o. male with a diagnosis of Osteomyelitis Right Foot who failed conservative measures and elected for surgical management.    The risks benefits and alternatives were discussed with the patient preoperatively including but not limited to the risks of infection, bleeding, nerve injury, cardiopulmonary complications, the need for revision surgery, among others, and the patient was willing to proceed.  OPERATIVE IMPLANTS:   Implant Name Type Inv. Item Serial No. Manufacturer Lot No. LRB No. Used Action  GRAFT SKIN WND MICRO 38 - [id number] Tissue GRAFT SKIN WND MICRO 38  [employer] [id number] Right 1 Implanted     OPERATIVE FINDINGS: Patient's muscle did not have good color or contractility.  OPERATIVE PROCEDURE: Patient was brought to the operating room after undergoing a regional anesthetic.  After adequate levels anesthesia were obtained a thigh tourniquet was placed and the lower extremity was prepped using DuraPrep draped into a sterile field. The foot was draped out of the sterile field with impervious stockinette.  A timeout was called and the tourniquet inflated.  A transverse skin incision was made 12 cm distal to the tibial tubercle, the incision curved proximally, and a large posterior flap was created.  The tibia was transected just proximal to the skin incision and beveled anteriorly.  The fibula was transected just proximal to the tibial incision.  The sciatic nerve was pulled cut and allowed to retract.   The vascular bundles were suture ligated with 2-0 silk.  The tourniquet was deflated and hemostasis obtained.  The wound was irrigated with Vashe.   The Kerecis micro powder 38 cm was applied to the open wound that has a 200 cm surface area.    The deep and superficial fascial layers were closed using #1 Vicryl.  The skin was closed using staples.    The Prevena customizable dressing was applied this was overwrapped with the arthroform sponge.  Yvone Herd was used to secure the sponges and the circumferential compression was secured to the skin with Dermatac.  This was connected to the wound VAC pump and had a good suction fit this was covered with a stump shrinker and a limb protector.  Patient was taken to the PACU in stable condition.   DISCHARGE PLANNING:  Antibiotic duration: 24-hour antibiotics  Weightbearing: Nonweightbearing on the operative extremity  Pain medication: Opioid pathway  Dressing care/ Wound VAC: Continue wound VAC with the Prevena plus pump at discharge for 1 week  Ambulatory devices: Walker or kneeling scooter  Discharge to: Discharge planning based on recommendations per physical therapy  Follow-up: In the office 1 week after discharge.

## 2023-11-14 NOTE — Progress Notes (Signed)
 PROGRESS NOTE    Levi [name]  XTK:[id number] DOB: Oct 05, 1945 DOA: 11/08/2023 PCP: Gean Keels, MD    Chief Complaint  Patient presents with   fall on thinners    Brief Narrative:  Levi [name] is a 78 y.o. male with medical history significant of PAF on Eliquis , HFpEF (EF 40-45% in 02/2023), HTN, HLD, COPD, BPH, GERD, and s/p recent LLE 2nd digit/RLE  5th digit ray amputation on 08/13/2023 p/w presyncopal episode with notable sepsis 2/2 RLE cellulitis vs OM.  MRI confirmed osteomyelitis.  Orthopedic called in consult.  Assessment & Plan:   Principal Problem:   Right foot infection Active Problems:   Sepsis (HCC)  Sepsis, present on admission, due to right lower extremity cellulitis, right foot foot osteomyelitis -Sepsis present on admission, tachycardic, with leukocytosis and elevated lactic acid -MRI of right foot significant for osteomyelitis, CT of right lower extremity significant for cellulitis - S/p recent RLE 5th ray resection - Antibiotics management per ID, continue with IV antibiotics, vancomycin , cefepime  and Flagyl . -Follow-up blood cultures, remains negative -Podiatry and ID consulted -BKA planned later today 11/14/23 - discuss antibiotic cessation post operatively assuming clear margins and no disseminated infection. -PTA lyrica  100mg  BID, and percocet 5mg  q4h prn -CRP is elevated, Pro-Cal is elevated,  Syncope, resolved -Most likely due to above, continue with telemetry monitoring, continue with IV fluids  PAF -Continue with Eliquis  and diltiazem  - Eliquis  changed to Lovenox  in anticipation of surgery, hold Lovenox  the night before surgery and anticoagulation postoperatively  Chronic systolic CHF -Is on IV fluids, monitor closely, Lasix  remains on hold   HTN -PTA diltiazem  per above -Hold home meds Imdur  and Lasix  - BP currently well controlled off medications   COPD -Brovana /pulmicort  nebs BID + Duonebs prn -Consider combination inhaler  such as Breztri or Symbicort at discharge   BPH -PTA tamsulosin    Tension headache/musculoskeletal strain - Left posterolateral neck strain/pain - Likely complicated by fall - continue supportive care (heat/ice/fioricet) - Imaging negative  DVT prophylaxis: on Eliquis  Code Status: Full Family Communication: None present Disposition: To be determined  Status is: Inpatient  Consultants:  Podiatry   Subjective: No acute issues or events reported overnight, Denies nausea vomit diarrhea constipation headache visual chest pain.  Left posterior neck pain improving with heat but not yet resolved  Objective: Vitals:   11/14/23 0100 11/14/23 0200 11/14/23 0300 11/14/23 0400  BP:    116/64  Pulse: 94 92 86 79  Resp: 14 16 10 13   Temp:    (!) 97.3 F (36.3 C)  TempSrc:    Oral  SpO2: 96% 93% 96% 100%  Weight:      Height:        Intake/Output Summary (Last 24 hours) at 11/14/2023 0733 Last data filed at 11/14/2023 0551 Gross per 24 hour  Intake --  Output 885 ml  Net -885 ml   Filed Weights   11/08/23 0518  Weight: 93 kg    Examination:  Awake Alert, Oriented X 3, No new F.N deficits, Normal affect Symmetrical Chest wall movement, Good air movement bilaterally, CTAB RRR,No Gallops,Rubs or new Murmurs, No Parasternal Heave +ve B.Sounds, Abd Soft, No tenderness, No rebound - guarding or rigidity. Right foot bandaged, but right lower extremity with +1 edema  Data Reviewed: I have personally reviewed following labs and imaging studies  CBC: Recent Labs  Lab 11/09/23 0753 11/10/23 0551 11/11/23 0513 11/12/23 0547 11/13/23 0627  WBC 10.3 7.6 6.3 5.9 6.1  HGB 10.1*  9.5* 9.6* 9.4* 9.1*  HCT 32.0* 30.0* 30.1* 29.1* 27.9*  MCV 96.1 96.5 94.4 94.5 93.0  PLT 144* 129* 144* 152 162   Basic Metabolic Panel: Recent Labs  Lab 11/09/23 0753 11/10/23 0551 11/11/23 0513 11/12/23 0547 11/13/23 0627  NA 138 133* 133* 132* 134*  K 3.9 3.4* 4.0 3.4* 3.4*  CL 105 105 107  104 103  CO2 23 22 19* 21* 20*  GLUCOSE 107* 105* 103* 75 78  BUN 18 15 14 13 11   CREATININE 0.78 0.81 0.73 0.64 0.65  CALCIUM  8.2* 7.5* 7.6* 7.9* 8.0*  MG  --  1.7  --   --   --   PHOS  --  2.2*  --   --   --    GFR: Estimated Creatinine Clearance: 91.3 mL/min (by C-G formula based on SCr of 0.65 mg/dL).  Liver Function Tests: Recent Labs  Lab 11/08/23 0514  AST 19  ALT 13  ALKPHOS 66  BILITOT 1.1  PROT 5.6*  ALBUMIN 2.9*   CBG: No results for input(s): GLUCAP in the last 168 hours.  Recent Results (from the past 240 hours)  Culture, blood (Routine x 2)     Status: None   Collection Time: 11/08/23  5:14 AM   Specimen: BLOOD RIGHT FOREARM  Result Value Ref Range Status   Specimen Description BLOOD RIGHT FOREARM  Final   Special Requests   Final    BOTTLES DRAWN AEROBIC AND ANAEROBIC Blood Culture adequate volume   Culture   Final    NO GROWTH 5 DAYS Performed at [facility], [address], [place], [place] [address]    Report Status 11/13/2023 FINAL  Final  Culture, blood (Routine x 2)     Status: None   Collection Time: 11/08/23  5:53 AM   Specimen: BLOOD  Result Value Ref Range Status   Specimen Description BLOOD BLOOD RIGHT ARM  Final   Special Requests   Final    BOTTLES DRAWN AEROBIC AND ANAEROBIC Blood Culture adequate volume   Culture   Final    NO GROWTH 5 DAYS Performed at [facility], [address] [place], [place] [address]    Report Status 11/13/2023 FINAL  Final    Radiology Studies: No results found.   Scheduled Meds:  arformoterol   15 mcg Nebulization BID   atorvastatin   20 mg Oral Daily   budesonide  (PULMICORT ) nebulizer solution  0.25 mg Nebulization BID   cyanocobalamin   1,000 mcg Subcutaneous Daily   Followed by   Cecily Cohen ON 11/17/2023] cyanocobalamin   1,000 mcg Subcutaneous Weekly   diclofenac  Sodium  4 g Topical QID   diltiazem   180 mg Oral Daily   docusate sodium   100 mg Oral BID   feeding supplement  237 mL Oral  BID BM   pantoprazole   40 mg Oral BID   polyethylene glycol  17 g Oral Daily   pregabalin   100 mg Oral BID   tamsulosin   0.4 mg Oral QHS   Continuous Infusions:  ceFEPime  (MAXIPIME ) IV 2 g (11/14/23 0551)   vancomycin  1,250 mg (11/13/23 2323)     LOS: 6 days   Haydee Lipa, DO [facility]  To contact the attending provider between 7A-7P or the covering provider during after hours 7P-7A, please log into the web site [email or web address] and access using universal [place] password for that web site. If you do not have the password, please call the hospital operator.  11/14/2023, 7:33  AM

## 2023-11-15 DIAGNOSIS — L089 Local infection of the skin and subcutaneous tissue, unspecified: Secondary | ICD-10-CM | POA: Diagnosis not present

## 2023-11-15 LAB — CBC
HCT: 27.6 % — ABNORMAL LOW (ref 39.0–52.0)
Hemoglobin: 9 g/dL — ABNORMAL LOW (ref 13.0–17.0)
MCH: 30.3 pg (ref 26.0–34.0)
MCHC: 32.6 g/dL (ref 30.0–36.0)
MCV: 92.9 fL (ref 80.0–100.0)
Platelets: 191 10*3/uL (ref 150–400)
RBC: 2.97 MIL/uL — ABNORMAL LOW (ref 4.22–5.81)
RDW: 14.8 % (ref 11.5–15.5)
WBC: 11.4 10*3/uL — ABNORMAL HIGH (ref 4.0–10.5)
nRBC: 0 % (ref 0.0–0.2)

## 2023-11-15 LAB — BASIC METABOLIC PANEL WITH GFR
Anion gap: 8 (ref 5–15)
BUN: 15 mg/dL (ref 8–23)
CO2: 21 mmol/L — ABNORMAL LOW (ref 22–32)
Calcium: 8.3 mg/dL — ABNORMAL LOW (ref 8.9–10.3)
Chloride: 105 mmol/L (ref 98–111)
Creatinine, Ser: 0.54 mg/dL — ABNORMAL LOW (ref 0.61–1.24)
GFR, Estimated: >60 mL/min (ref >60)
Glucose, Bld: 118 mg/dL — ABNORMAL HIGH (ref 70–99)
Potassium: 3.8 mmol/L (ref 3.5–5.1)
Sodium: 134 mmol/L — ABNORMAL LOW (ref 135–145)

## 2023-11-15 MED ORDER — DICLOFENAC SODIUM 1 % EX GEL: 4.0000 g | CUTANEOUS | Status: DC | PRN

## 2023-11-15 NOTE — Progress Notes (Signed)
 Patient ID: Levi [name], male   DOB: 08-02-1945, 78 y.o.   MRN: [id number] Patient is postoperative day 1 right low knee amputation.  There is no drainage in the wound VAC canister there is a good suction fit.  Anticipate discharge to skilled nursing.  Patient will discharge with the Prevena plus portable wound VAC pump.  I will follow-up in the office 1 week after discharge.

## 2023-11-15 NOTE — Progress Notes (Signed)
 PROGRESS NOTE    Levi [name]  FMW:[id number] DOB: 17-Jun-1945 DOA: 11/08/2023 PCP: Curtis Debby PARAS, MD    Chief Complaint  Patient presents with   fall on thinners    Brief Narrative:  Levi [name] is a 78 y.o. male with medical history significant of PAF on Eliquis , HFpEF (EF 40-45% in 02/2023), HTN, HLD, COPD, BPH, GERD, and s/p recent LLE 2nd digit/RLE  5th digit ray amputation on 08/13/2023 p/w presyncopal episode with notable sepsis 2/2 RLE cellulitis vs OM.  MRI confirmed osteomyelitis.  Orthopedic called in consult.  Assessment & Plan:   Principal Problem:   Right foot infection Active Problems:   Sepsis (HCC)  Sepsis, present on admission, due to right lower extremity cellulitis, right foot foot osteomyelitis -Sepsis present on admission, tachycardic, with leukocytosis and elevated lactic acid -MRI of right foot significant for osteomyelitis, CT of right lower extremity significant for cellulitis - S/p recent RLE 5th ray resection - Antibiotics management per ID, continue with IV antibiotics, vancomycin , cefepime  and Flagyl . -Follow-up blood cultures, remains negative -Podiatry and ID consulted -BKA 11/14/23 tolerated well without any complications -antibiotics discontinued today(24 hours after amputation) -Follow-up with Dr. Harden in 1 week for wound VAC evaluation and dressing change -PT OT to follow, resume will need disposition to SNF -PTA lyrica  100mg  BID, and percocet 5mg  q4h prn  Syncope, resolved -Most likely due to above, continue with telemetry monitoring, continue with IV fluids  PAF -Continue with Eliquis  and diltiazem  - Eliquis  changed to Lovenox  in anticipation of surgery, hold Lovenox  the night before surgery and anticoagulation postoperatively  Chronic systolic CHF -Is on IV fluids, monitor closely, Lasix  remains on hold   HTN -PTA diltiazem  per above -Hold home meds Imdur  and Lasix  - BP currently well controlled off medications    COPD -Brovana /pulmicort  nebs BID + Duonebs prn -Consider combination inhaler such as Breztri or Symbicort at discharge   BPH -PTA tamsulosin    Tension headache/musculoskeletal strain - Left posterolateral neck strain/pain - Likely complicated by fall - continue supportive care (heat/ice/fioricet ) - Imaging negative  DVT prophylaxis: on Eliquis  Code Status: Full Family Communication: None present Disposition: To be determined  Status is: Inpatient Admitted from: Home Discharge to: SNF Patient likely medically stable for discharge in 24 to 48 hours pending post operative recovery and PT OT evaluations.  Consultants:  Podiatry   Subjective: No acute issues or events reported overnight, pain currently well-controlled denies nausea vomiting diarrhea constipation high fevers chills or chest pain.  Objective: Vitals:   11/15/23 0200 11/15/23 0300 11/15/23 0400 11/15/23 0500  BP: 139/82 (!) 155/82 (!) 148/76 (!) 150/75  Pulse: 88 91 88 89  Resp: (!) 23 19 15 10   Temp:   (!) 97.5 F (36.4 C)   TempSrc:   Oral   SpO2: 98% 90% 98% 92%  Weight:      Height:        Intake/Output Summary (Last 24 hours) at 11/15/2023 0709 Last data filed at 11/15/2023 0600 Gross per 24 hour  Intake 400 ml  Output 890 ml  Net -490 ml   Filed Weights   11/08/23 0518 11/14/23 1232  Weight: 93 kg (P) 93 kg    Examination:  General:  Pleasantly resting in bed, No acute distress. HEENT:  Normocephalic atraumatic.  Sclerae nonicteric, noninjected.  Extraocular movements intact bilaterally. Neck:  Without mass or deformity.  Trachea is midline. Lungs:  Clear to auscultate bilaterally without rhonchi, wheeze, or rales. Heart:  Regular rate  and rhythm.  Without murmurs, rubs, or gallops. Abdomen:  Soft, nontender, nondistended.  Without guarding or rebound. Extremities: Right BKA postoperative dressing and wound VAC clean dry intact, left wrist skin tear bandage clean dry intact  Data  Reviewed: I have personally reviewed following labs and imaging studies  CBC: Recent Labs  Lab 11/10/23 0551 11/11/23 0513 11/12/23 0547 11/13/23 0627 11/14/23 0722  WBC 7.6 6.3 5.9 6.1 7.3  HGB 9.5* 9.6* 9.4* 9.1* 10.1*  HCT 30.0* 30.1* 29.1* 27.9* 30.9*  MCV 96.5 94.4 94.5 93.0 94.2  PLT 129* 144* 152 162 180   Basic Metabolic Panel: Recent Labs  Lab 11/10/23 0551 11/11/23 0513 11/12/23 0547 11/13/23 0627 11/14/23 0722  NA 133* 133* 132* 134* 136  K 3.4* 4.0 3.4* 3.4* 4.2  CL 105 107 104 103 106  CO2 22 19* 21* 20* 22  GLUCOSE 105* 103* 75 78 89  BUN 15 14 13 11 15   CREATININE 0.81 0.73 0.64 0.65 0.67  CALCIUM  7.5* 7.6* 7.9* 8.0* 8.2*  MG 1.7  --   --   --   --   PHOS 2.2*  --   --   --   --    GFR: Estimated Creatinine Clearance: 91.3 mL/min (by C-G formula based on SCr of 0.67 mg/dL).  Liver Function Tests: No results for input(s): AST, ALT, ALKPHOS, BILITOT, PROT, ALBUMIN in the last 168 hours.  CBG: No results for input(s): GLUCAP in the last 168 hours.  Recent Results (from the past 240 hours)  Culture, blood (Routine x 2)     Status: None   Collection Time: 11/08/23  5:14 AM   Specimen: BLOOD RIGHT FOREARM  Result Value Ref Range Status   Specimen Description BLOOD RIGHT FOREARM  Final   Special Requests   Final    BOTTLES DRAWN AEROBIC AND ANAEROBIC Blood Culture adequate volume   Culture   Final    NO GROWTH 5 DAYS Performed at [facility], [address], [place], [place] [address]    Report Status 11/13/2023 FINAL  Final  Culture, blood (Routine x 2)     Status: None   Collection Time: 11/08/23  5:53 AM   Specimen: BLOOD  Result Value Ref Range Status   Specimen Description BLOOD BLOOD RIGHT ARM  Final   Special Requests   Final    BOTTLES DRAWN AEROBIC AND ANAEROBIC Blood Culture adequate volume   Culture   Final    NO GROWTH 5 DAYS Performed at [facility], [address], [place], [place] [address]     Report Status 11/13/2023 FINAL  Final    Radiology Studies: No results found.   Scheduled Meds:  acetaminophen   1,000 mg Oral Q6H   arformoterol   15 mcg Nebulization BID   atorvastatin   20 mg Oral Daily   budesonide  (PULMICORT ) nebulizer solution  0.25 mg Nebulization BID   cyanocobalamin   1,000 mcg Subcutaneous Daily   Followed by   NOREEN ON 11/17/2023] cyanocobalamin   1,000 mcg Subcutaneous Weekly   diclofenac  Sodium  4 g Topical QID   diltiazem   180 mg Oral Daily   docusate sodium   100 mg Oral BID   feeding supplement  237 mL Oral BID BM   pantoprazole   40 mg Oral BID   polyethylene glycol  17 g Oral Daily   pregabalin   100 mg Oral BID   tamsulosin   0.4 mg Oral QHS   Continuous Infusions:  ceFEPime  (MAXIPIME ) IV 2 g (  11/15/23 9390)   magnesium  sulfate bolus IVPB     vancomycin  1,250 mg (11/14/23 2241)     LOS: 7 days   Elsie JAYSON Montclair, DO [facility]  To contact the attending provider between 7A-7P or the covering provider during after hours 7P-7A, please log into the web site [email or web address] and access using universal [place] password for that web site. If you do not have the password, please call the hospital operator.  11/15/2023, 7:09 AM

## 2023-11-15 NOTE — Evaluation (Signed)
 Occupational Therapy Evaluation Patient Details Name: Levi [name] MRN: [id number] DOB: 07/11/1945 Today's Date: 11/15/2023   History of Present Illness   78 yo male presents to [facility] on [facility] with mechanical fall + head trauma. Workup for septic shock due to RLE cellulitis and possible osteomyelitis, s/p R BKA 6/20. CTH and CT Cspine negative for acute findings.SABRA PMHx:  admission 03/2023 for RLE wound and cellulitis, agent orange exposure, afib, neuropathy, osteoporosis, CHF, COPD, HLD and venous insufficiency of both lower extremities, PAF, L 2nd and 5th toe amputations 07/2023, recent RLE 5th ray resection, ACDF 2014 and 2023, lumbar fusion 2018, x5 joint replacements.     Clinical Impressions Pt c/o minimal pain to residual RLE, feeling good, motivated to participate in OOB activities. Co-treat with PT. Pt lives at home with wife, has ample support from family 24/7 if needed, PLOF mod I with most ADLs/mobility, some help with LB dressing. Pt currently min A for UB ADLs sitting EOB or recliner, continues to need max A for LB ADLs. Pt max A x2 for stand pivot transfer with RW to recliner, increased effort/time for pivoting on LLE, not able to hop to take steps or scoot foot at this time but hopeful to quickly improve. RN informed to use Stedy x2 assist for return to bed. Pt educated on when to wear limb protector, pillow placement under residual RLE to prevent contracture, and PT went over RLE exercise to maximize and maintain function/ROM. Pt would greatly benefit from postacute intensive rehab >3hrs/day to maximize functional independence, ample support at home and highly motivated. Will continue to see Pt acutely to progress as able.      If plan is discharge home, recommend the following:   Two people to help with walking and/or transfers;A lot of help with bathing/dressing/bathroom;Assistance with cooking/housework;Assist for transportation     Functional Status Assessment   Patient has  had a recent decline in their functional status and demonstrates the ability to make significant improvements in function in a reasonable and predictable amount of time.     Equipment Recommendations   Other (comment) (defer)     Recommendations for Other Services   Rehab consult     Precautions/Restrictions   Precautions Precautions: Fall Recall of Precautions/Restrictions: Intact Required Braces or Orthoses: Other Brace Other Brace: RLE limb protector Restrictions Weight Bearing Restrictions Per Provider Order: Yes RLE Weight Bearing Per Provider Order: Non weight bearing Other Position/Activity Restrictions: no pillow under R knee     Mobility Bed Mobility Overal bed mobility: Needs Assistance Bed Mobility: Supine to Sit     Supine to sit: Min assist     General bed mobility comments: min A with HOB elevated for sitting up and scooting to EOB    Transfers Overall transfer level: Needs assistance Equipment used: Rolling walker (2 wheels) Transfers: Sit to/from Stand, Bed to chair/wheelchair/BSC Sit to Stand: Max assist, +2 physical assistance, +2 safety/equipment Stand pivot transfers: Max assist, +2 physical assistance, +2 safety/equipment         General transfer comment: max A x2 stand pivot to recliner, R BKA, heavy reliance on BUEs to pivot L foot, not able to hop or step with LLE at this time      Balance Overall balance assessment: Needs assistance Sitting-balance support: No upper extremity supported, Feet supported Sitting balance-[name] Scale: Fair     Standing balance support: Bilateral upper extremity supported, During functional activity, Reliant on assistive device for balance Standing balance-[name] Scale: Poor Standing balance  comment: heavy reliance on RW, max A x2 to power STS, once up able to maintain standing with RW                           ADL either performed or assessed with clinical judgement   ADL Overall ADL's :  Needs assistance/impaired Eating/Feeding: Independent   Grooming: Set up;Sitting   Upper Body Bathing: Minimal assistance;Sitting   Lower Body Bathing: Maximal assistance;Sitting/lateral leans   Upper Body Dressing : Minimal assistance;Sitting   Lower Body Dressing: Maximal assistance;Sitting/lateral leans   Toilet Transfer: Maximal assistance;+2 for physical assistance;+2 for safety/equipment;BSC/3in1;Rolling walker (2 wheels)   Toileting- Clothing Manipulation and Hygiene: Moderate assistance;Sitting/lateral lean         General ADL Comments: Pt requires help for LB ADLs at baseline, min A for UB dressing/bathing. Pt stands with max A x2 assist, increased effort for pivoting to recliner, will do well with Stedy     Vision Baseline Vision/History: 1 Wears glasses Ability to See in Adequate Light: 0 Adequate Patient Visual Report: No change from baseline       Perception         Praxis         Pertinent Vitals/Pain Pain Assessment Pain Assessment: No/denies pain Faces Pain Scale: Hurts a little bit Pain Location: R residual lower extremity Pain Descriptors / Indicators: Aching Pain Intervention(s): Monitored during session     Extremity/Trunk Assessment Upper Extremity Assessment Upper Extremity Assessment: Overall WFL for tasks assessed;RUE deficits/detail;LUE deficits/detail RUE Deficits / Details: limited B shoulder ROM, some stiffness in fingers but overall functional RUE Sensation: WNL RUE Coordination: WNL LUE Deficits / Details: limited B shoulder ROM, some stiffness in fingers but overall functional LUE Sensation: WNL LUE Coordination: WNL   Lower Extremity Assessment Lower Extremity Assessment: Defer to PT evaluation       Communication Communication Communication: No apparent difficulties   Cognition Arousal: Alert Behavior During Therapy: WFL for tasks assessed/performed Cognition: No apparent impairments                                Following commands: Intact       Cueing  General Comments          Exercises     Shoulder Instructions      Home Living Family/patient expects to be discharged to:: Private residence Living Arrangements: Spouse/significant other Available Help at Discharge: Family;Available 24 hours/day Type of Home: House                       Home Equipment: Shower Scientist, physiological (2 wheels);Cane - single point;Rollator (4 wheels)   Additional Comments: Pt lives with wife, daughter lives close by, and other family who could help 24/7      Prior Functioning/Environment Prior Level of Function : Needs assist;History of Falls (last six months);Driving             Mobility Comments: using rollator for gait, walks some community ditances ADLs Comments: wife assisting with LB dressing, indep with showering. Wife cooks and cleans, also has a cleaner come in 2x/month    OT Problem List: Decreased strength;Impaired balance (sitting and/or standing);Pain;Impaired UE functional use;Increased edema;Obesity;Decreased range of motion   OT Treatment/Interventions: Self-care/ADL training;Therapeutic exercise;DME and/or AE instruction;Therapeutic activities;Patient/family education;Balance training      OT Goals(Current goals can be found in the care plan section)  Acute Rehab OT Goals Patient Stated Goal: to improve strength and independence with transfers OT Goal Formulation: With patient/family Time For Goal Achievement: 11/29/23 Potential to Achieve Goals: Good   OT Frequency:  Min 2X/week    Co-evaluation PT/OT/SLP Co-Evaluation/Treatment: Yes Reason for Co-Treatment: Complexity of the patient's impairments (multi-system involvement);For patient/therapist safety;To address functional/ADL transfers   OT goals addressed during session: ADL's and self-care;Proper use of Adaptive equipment and DME      AM-PAC OT 6 Clicks Daily Activity     Outcome  Measure Help from another person eating meals?: None Help from another person taking care of personal grooming?: A Little Help from another person toileting, which includes using toliet, bedpan, or urinal?: A Lot Help from another person bathing (including washing, rinsing, drying)?: A Lot Help from another person to put on and taking off regular upper body clothing?: A Little Help from another person to put on and taking off regular lower body clothing?: A Lot 6 Click Score: 16   End of Session Equipment Utilized During Treatment: Gait belt;Rolling walker (2 wheels) Nurse Communication: Mobility status;Need for lift equipment  Activity Tolerance: Patient tolerated treatment well Patient left: in chair;with call bell/phone within reach;with chair alarm set;with family/visitor present  OT Visit Diagnosis: Other abnormalities of gait and mobility (R26.89);Muscle weakness (generalized) (M62.81);Pain;History of falling (Z91.81);Unsteadiness on feet (R26.81) Pain - Right/Left: Right Pain - part of body: Leg                Time: 8666-8596 OT Time Calculation (min): 30 min Charges:  OT General Charges $OT Visit: 1 Visit OT Evaluation $OT Re-eval: 1 Re-eval  [address], OTR/L   Elouise JONELLE Bott 11/15/2023, 2:19 PM

## 2023-11-15 NOTE — Progress Notes (Shared)
 Inpatient Rehab Admissions Coordinator:    I met with pt. And wife to discuss potential CIR (pending therapy recommendations). They are very interested and want to pursue admit if therapy feels he can tolerate. If he's stable and does well with therapy today, may have bed for him tomorrow.   Leita Kleine, MS, CCC-SLP Rehab Admissions Coordinator  [phone] (celll) [phone] (office)

## 2023-11-15 NOTE — Evaluation (Signed)
 Physical Therapy Re-Evaluation Patient Details Name: Levi [name] MRN: [id number] DOB: [birth year] Today's Date: 11/15/2023  History of Present Illness  78 yo male presents to [facility] on 6/14 with mechanical fall + head trauma. Workup for septic shock due to RLE cellulitis and possible osteomyelitis, s/p R BKA 6/20. PMHx:  admission 03/2023 for RLE wound and cellulitis, agent orange exposure, afib, neuropathy, osteoporosis, CHF, COPD, HLD and venous insufficiency of both lower extremities, PAF, L 2nd and 5th toe amputations 07/2023, recent RLE 5th ray resection, ACDF 2014 and 2023, lumbar fusion 2018, x5 joint replacements.  Clinical Impression  Revaluated post-op day #1. Required min assist for bed mobility and up to max assist +2 for sit to stand and pivot transfer. Extensive education for post-op recovery including desensitization techniques to reduce phantom pains, Safety with mobility, ampushield use with transfers, Knee extension at rest, repositioning, and BKA exercises with handout. Patient will benefit from intensive inpatient follow-up therapy, >3 hours/day. Patient will continue to benefit from skilled physical therapy services to further improve independence with functional mobility.         If plan is discharge home, recommend the following: Two people to help with walking and/or transfers;Two people to help with bathing/dressing/bathroom;Assistance with cooking/housework;Assist for transportation;Help with stairs or ramp for entrance   Can travel by private vehicle        Equipment Recommendations Wheelchair (measurements PT);Wheelchair cushion (measurements PT) (Amputee wheelchair with removable arm and leg rests; likely to need slide board, hospital bed, drop arm BSC if he goes directly home after amputation surgery)  Recommendations for Other Services  Rehab consult    Functional Status Assessment Patient has had a recent decline in their functional status and demonstrates the  ability to make significant improvements in function in a reasonable and predictable amount of time.     Precautions / Restrictions Precautions Precautions: Fall Recall of Precautions/Restrictions: Intact Required Braces or Orthoses: Other Brace Other Brace: RLE ampushield Restrictions Weight Bearing Restrictions Per Provider Order: Yes RLE Weight Bearing Per Provider Order: Non weight bearing Other Position/Activity Restrictions: no pillow under R knee      Mobility  Bed Mobility Overal bed mobility: Needs Assistance Bed Mobility: Supine to Sit     Supine to sit: Min assist     General bed mobility comments: min A with HOB elevated for sitting up and scooting to EOB. Cues for technique. Ampushield in place.    Transfers Overall transfer level: Needs assistance Equipment used: Rolling walker (2 wheels) Transfers: Sit to/from Stand, Bed to chair/wheelchair/BSC Sit to Stand: Max assist, +2 physical assistance, +2 safety/equipment Stand pivot transfers: Max assist, +2 physical assistance, +2 safety/equipment         General transfer comment: max A +2  for boost to stand. Cues for technique and hand placement. Max A +2 for pivot to recliner, sequencing of RW and to balance pt. No buckling, unable to hop. heavy reliance on BUEs to pivot L foot.    Ambulation/Gait                  Stairs            Wheelchair Mobility     Tilt Bed    Modified Rankin (Stroke Patients Only)       Balance Overall balance assessment: Needs assistance Sitting-balance support: No upper extremity supported, Feet supported Sitting balance-[name] Scale: Fair     Standing balance support: Bilateral upper extremity supported, During functional activity, Reliant on assistive device  for balance Standing balance-[name] Scale: Poor Standing balance comment: RW to steady, with assist for balance.                             Pertinent Vitals/Pain Pain Assessment Pain  Assessment: Faces Faces Pain Scale: Hurts a little bit Pain Location: R residual lower extremity Pain Descriptors / Indicators: Aching Pain Intervention(s): Monitored during session, Repositioned    Home Living Family/patient expects to be discharged to:: Private residence Living Arrangements: Spouse/significant other Available Help at Discharge: Family;Available 24 hours/day Type of Home: House           Home Equipment: Shower Scientist, physiological (2 wheels);Cane - single point;Rollator (4 wheels) Additional Comments: Pt lives with wife, daughter lives close by, and other family who could help 24/7    Prior Function Prior Level of Function : Needs assist;History of Falls (last six months);Driving             Mobility Comments: using rollator for gait, walks some community ditances ADLs Comments: wife assisting with LB dressing, indep with showering. Wife cooks and cleans, also has a cleaner come in 2x/month     Extremity/Trunk Assessment   Upper Extremity Assessment Upper Extremity Assessment: Defer to OT evaluation RUE Deficits / Details: limited B shoulder ROM, some stiffness in fingers but overall functional RUE Sensation: WNL RUE Coordination: WNL LUE Deficits / Details: limited B shoulder ROM, some stiffness in fingers but overall functional LUE Sensation: WNL LUE Coordination: WNL    Lower Extremity Assessment Lower Extremity Assessment: Generalized weakness;RLE deficits/detail RLE Deficits / Details: Shrinker sock in place, vac running at residual limb amputation site.       Communication   Communication Communication: No apparent difficulties    Cognition Arousal: Alert Behavior During Therapy: WFL for tasks assessed/performed   PT - Cognitive impairments: No apparent impairments                         Following commands: Intact       Cueing Cueing Techniques: Verbal cues     General Comments General comments (skin  integrity, edema, etc.): VSS    Exercises Amputee Exercises Quad Sets: Strengthening, Right, 10 reps, Seated Gluteal Sets: Strengthening, Both, 10 reps, Seated Hip Flexion/Marching: Strengthening, Right, 5 reps, Seated Knee Flexion: AROM, Right, 5 reps, Seated   Assessment/Plan    PT Assessment Patient needs continued PT services  PT Problem List Decreased strength;Decreased mobility;Decreased activity tolerance;Decreased balance;Decreased knowledge of use of DME;Pain;Impaired sensation;Cardiopulmonary status limiting activity;Decreased knowledge of precautions;Decreased safety awareness;Decreased skin integrity;Decreased range of motion       PT Treatment Interventions DME instruction;Gait training;Functional mobility training;Therapeutic activities;Therapeutic exercise;Balance training;Neuromuscular re-education;Patient/family education;Wheelchair mobility training;Modalities    PT Goals (Current goals can be found in the Care Plan section)  Acute Rehab PT Goals Patient Stated Goal: Restore function PT Goal Formulation: With patient Time For Goal Achievement: 11/28/23 Potential to Achieve Goals: Good    Frequency Min 4X/week     Co-evaluation PT/OT/SLP Co-Evaluation/Treatment: Yes Reason for Co-Treatment: Complexity of the patient's impairments (multi-system involvement);For patient/therapist safety;To address functional/ADL transfers PT goals addressed during session: Mobility/safety with mobility;Balance;Proper use of DME;Strengthening/ROM OT goals addressed during session: ADL's and self-care;Proper use of Adaptive equipment and DME       AM-PAC PT 6 Clicks Mobility  Outcome Measure Help needed turning from your back to your side while in a flat bed without using bedrails?:  A Little Help needed moving from lying on your back to sitting on the side of a flat bed without using bedrails?: A Little Help needed moving to and from a bed to a chair (including a wheelchair)?:  Total Help needed standing up from a chair using your arms (e.g., wheelchair or bedside chair)?: Total Help needed to walk in hospital room?: Total Help needed climbing 3-5 steps with a railing? : Total 6 Click Score: 10    End of Session Equipment Utilized During Treatment: Gait belt (ampushield) Activity Tolerance: Patient tolerated treatment well Patient left: in chair;with call bell/phone within reach;with chair alarm set;with family/visitor present Nurse Communication: Mobility status;Need for lift equipment;Precautions (Stedy +2 assist) PT Visit Diagnosis: Other abnormalities of gait and mobility (R26.89);Muscle weakness (generalized) (M62.81);Unsteadiness on feet (R26.81);Difficulty in walking, not elsewhere classified (R26.2);Other symptoms and signs involving the nervous system (R29.898);Pain Pain - Right/Left: Right Pain - part of body: Leg    Time: 8666-8596 PT Time Calculation (min) (ACUTE ONLY): 30 min   Charges:   PT Evaluation $PT Re-evaluation: 1 Re-eval   PT General Charges $$ ACUTE PT VISIT: 1 Visit         Leontine Roads, PT, DPT [facility]  Rehabilitation Services Physical Therapist Office: [phone] Website: [email or web address]   Leontine GORMAN Roads 11/15/2023, 5:31 PM

## 2023-11-15 NOTE — Plan of Care (Signed)
  Problem: Education: Goal: Knowledge of General Education information will improve Description: Including pain rating scale, medication(s)/side effects and non-pharmacologic comfort measures Outcome: Progressing   Problem: Health Behavior/Discharge Planning: Goal: Ability to manage health-related needs will improve Outcome: Progressing   Problem: Clinical Measurements: Goal: Ability to maintain clinical measurements within normal limits will improve Outcome: Progressing Goal: Will remain free from infection Outcome: Progressing Goal: Diagnostic test results will improve Outcome: Progressing Goal: Respiratory complications will improve Outcome: Progressing Goal: Cardiovascular complication will be avoided Outcome: Progressing   Problem: Activity: Goal: Risk for activity intolerance will decrease Outcome: Progressing   Problem: Nutrition: Goal: Adequate nutrition will be maintained Outcome: Progressing   Problem: Coping: Goal: Level of anxiety will decrease Outcome: Progressing   Problem: Elimination: Goal: Will not experience complications related to bowel motility Outcome: Progressing Goal: Will not experience complications related to urinary retention Outcome: Progressing   Problem: Pain Managment: Goal: General experience of comfort will improve and/or be controlled Outcome: Progressing   Problem: Safety: Goal: Ability to remain free from injury will improve Outcome: Progressing   Problem: Skin Integrity: Goal: Risk for impaired skin integrity will decrease Outcome: Progressing   Problem: Education: Goal: Knowledge of the prescribed therapeutic regimen will improve Outcome: Progressing Goal: Ability to verbalize activity precautions or restrictions will improve Outcome: Progressing Goal: Understanding of discharge needs will improve Outcome: Progressing   Problem: Activity: Goal: Ability to perform//tolerate increased activity and mobilize with assistive  devices will improve Outcome: Progressing   Problem: Clinical Measurements: Goal: Postoperative complications will be avoided or minimized Outcome: Progressing   Problem: Self-Care: Goal: Ability to meet self-care needs will improve Outcome: Progressing   Problem: Self-Concept: Goal: Ability to maintain and perform role responsibilities to the fullest extent possible will improve Outcome: Progressing   Problem: Pain Management: Goal: Pain level will decrease with appropriate interventions Outcome: Progressing   Problem: Skin Integrity: Goal: Demonstration of wound healing without infection will improve Outcome: Progressing

## 2023-11-16 ENCOUNTER — Inpatient Hospital Stay (HOSPITAL_COMMUNITY)
Admission: RE | Admit: 2023-11-16 | Discharge: 2023-12-05 | DRG: 560 | Disposition: A | Discharge status: Home Health | Source: Intra-hospital | Attending: Physical Medicine & Rehabilitation | Admitting: Physical Medicine & Rehabilitation

## 2023-11-16 ENCOUNTER — Encounter (HOSPITAL_COMMUNITY): Payer: Self-pay | Admitting: Physical Medicine & Rehabilitation

## 2023-11-16 ENCOUNTER — Other Ambulatory Visit: Payer: Self-pay

## 2023-11-16 DIAGNOSIS — Z8042 Family history of malignant neoplasm of prostate: Secondary | ICD-10-CM

## 2023-11-16 DIAGNOSIS — Z981 Arthrodesis status: Secondary | ICD-10-CM

## 2023-11-16 DIAGNOSIS — Z96641 Presence of right artificial hip joint: Secondary | ICD-10-CM | POA: Diagnosis present

## 2023-11-16 DIAGNOSIS — Z8249 Family history of ischemic heart disease and other diseases of the circulatory system: Secondary | ICD-10-CM | POA: Diagnosis not present

## 2023-11-16 DIAGNOSIS — M109 Gout, unspecified: Secondary | ICD-10-CM | POA: Diagnosis present

## 2023-11-16 DIAGNOSIS — R52 Pain, unspecified: Secondary | ICD-10-CM | POA: Diagnosis not present

## 2023-11-16 DIAGNOSIS — I110 Hypertensive heart disease with heart failure: Secondary | ICD-10-CM | POA: Diagnosis present

## 2023-11-16 DIAGNOSIS — K219 Gastro-esophageal reflux disease without esophagitis: Secondary | ICD-10-CM | POA: Diagnosis present

## 2023-11-16 DIAGNOSIS — Z800 Family history of malignant neoplasm of digestive organs: Secondary | ICD-10-CM

## 2023-11-16 DIAGNOSIS — G629 Polyneuropathy, unspecified: Secondary | ICD-10-CM | POA: Diagnosis present

## 2023-11-16 DIAGNOSIS — E871 Hypo-osmolality and hyponatremia: Secondary | ICD-10-CM | POA: Diagnosis present

## 2023-11-16 DIAGNOSIS — E538 Deficiency of other specified B group vitamins: Secondary | ICD-10-CM | POA: Diagnosis present

## 2023-11-16 DIAGNOSIS — S88111A Complete traumatic amputation at level between knee and ankle, right lower leg, initial encounter: Secondary | ICD-10-CM | POA: Diagnosis not present

## 2023-11-16 DIAGNOSIS — I5032 Chronic diastolic (congestive) heart failure: Secondary | ICD-10-CM | POA: Diagnosis present

## 2023-11-16 DIAGNOSIS — M778 Other enthesopathies, not elsewhere classified: Secondary | ICD-10-CM | POA: Diagnosis not present

## 2023-11-16 DIAGNOSIS — Z79899 Other long term (current) drug therapy: Secondary | ICD-10-CM | POA: Diagnosis not present

## 2023-11-16 DIAGNOSIS — Z96612 Presence of left artificial shoulder joint: Secondary | ICD-10-CM | POA: Diagnosis present

## 2023-11-16 DIAGNOSIS — M1990 Unspecified osteoarthritis, unspecified site: Secondary | ICD-10-CM | POA: Diagnosis present

## 2023-11-16 DIAGNOSIS — Z96653 Presence of artificial knee joint, bilateral: Secondary | ICD-10-CM | POA: Diagnosis present

## 2023-11-16 DIAGNOSIS — Z8701 Personal history of pneumonia (recurrent): Secondary | ICD-10-CM

## 2023-11-16 DIAGNOSIS — Z7952 Long term (current) use of systemic steroids: Secondary | ICD-10-CM

## 2023-11-16 DIAGNOSIS — E785 Hyperlipidemia, unspecified: Secondary | ICD-10-CM | POA: Diagnosis present

## 2023-11-16 DIAGNOSIS — R197 Diarrhea, unspecified: Secondary | ICD-10-CM | POA: Diagnosis not present

## 2023-11-16 DIAGNOSIS — I480 Paroxysmal atrial fibrillation: Secondary | ICD-10-CM | POA: Diagnosis present

## 2023-11-16 DIAGNOSIS — E876 Hypokalemia: Secondary | ICD-10-CM | POA: Diagnosis present

## 2023-11-16 DIAGNOSIS — Z7901 Long term (current) use of anticoagulants: Secondary | ICD-10-CM

## 2023-11-16 DIAGNOSIS — Z96643 Presence of artificial hip joint, bilateral: Secondary | ICD-10-CM | POA: Diagnosis present

## 2023-11-16 DIAGNOSIS — M19011 Primary osteoarthritis, right shoulder: Secondary | ICD-10-CM | POA: Diagnosis not present

## 2023-11-16 DIAGNOSIS — J439 Emphysema, unspecified: Secondary | ICD-10-CM | POA: Diagnosis present

## 2023-11-16 DIAGNOSIS — Z89511 Acquired absence of right leg below knee: Secondary | ICD-10-CM | POA: Diagnosis not present

## 2023-11-16 DIAGNOSIS — J4489 Other specified chronic obstructive pulmonary disease: Secondary | ICD-10-CM | POA: Diagnosis present

## 2023-11-16 DIAGNOSIS — D649 Anemia, unspecified: Secondary | ICD-10-CM | POA: Diagnosis not present

## 2023-11-16 DIAGNOSIS — E039 Hypothyroidism, unspecified: Secondary | ICD-10-CM | POA: Diagnosis present

## 2023-11-16 DIAGNOSIS — Z83719 Family history of colon polyps, unspecified: Secondary | ICD-10-CM

## 2023-11-16 DIAGNOSIS — S43001A Unspecified subluxation of right shoulder joint, initial encounter: Secondary | ICD-10-CM | POA: Diagnosis not present

## 2023-11-16 DIAGNOSIS — L089 Local infection of the skin and subcutaneous tissue, unspecified: Secondary | ICD-10-CM | POA: Diagnosis not present

## 2023-11-16 DIAGNOSIS — M62838 Other muscle spasm: Secondary | ICD-10-CM | POA: Diagnosis not present

## 2023-11-16 DIAGNOSIS — Z9049 Acquired absence of other specified parts of digestive tract: Secondary | ICD-10-CM

## 2023-11-16 DIAGNOSIS — Z4781 Encounter for orthopedic aftercare following surgical amputation: Secondary | ICD-10-CM | POA: Diagnosis not present

## 2023-11-16 DIAGNOSIS — W19XXXA Unspecified fall, initial encounter: Secondary | ICD-10-CM | POA: Diagnosis not present

## 2023-11-16 DIAGNOSIS — M810 Age-related osteoporosis without current pathological fracture: Secondary | ICD-10-CM | POA: Diagnosis present

## 2023-11-16 DIAGNOSIS — K5901 Slow transit constipation: Secondary | ICD-10-CM | POA: Diagnosis not present

## 2023-11-16 DIAGNOSIS — Z809 Family history of malignant neoplasm, unspecified: Secondary | ICD-10-CM

## 2023-11-16 DIAGNOSIS — G548 Other nerve root and plexus disorders: Secondary | ICD-10-CM | POA: Diagnosis not present

## 2023-11-16 DIAGNOSIS — F54 Psychological and behavioral factors associated with disorders or diseases classified elsewhere: Secondary | ICD-10-CM | POA: Diagnosis not present

## 2023-11-16 DIAGNOSIS — M7989 Other specified soft tissue disorders: Secondary | ICD-10-CM

## 2023-11-16 DIAGNOSIS — I5022 Chronic systolic (congestive) heart failure: Secondary | ICD-10-CM | POA: Diagnosis not present

## 2023-11-16 DIAGNOSIS — Z823 Family history of stroke: Secondary | ICD-10-CM

## 2023-11-16 DIAGNOSIS — Z8349 Family history of other endocrine, nutritional and metabolic diseases: Secondary | ICD-10-CM

## 2023-11-16 DIAGNOSIS — Z833 Family history of diabetes mellitus: Secondary | ICD-10-CM | POA: Diagnosis not present

## 2023-11-16 DIAGNOSIS — D62 Acute posthemorrhagic anemia: Secondary | ICD-10-CM | POA: Diagnosis not present

## 2023-11-16 DIAGNOSIS — Z7951 Long term (current) use of inhaled steroids: Secondary | ICD-10-CM

## 2023-11-16 DIAGNOSIS — M25511 Pain in right shoulder: Secondary | ICD-10-CM | POA: Diagnosis not present

## 2023-11-16 DIAGNOSIS — I739 Peripheral vascular disease, unspecified: Secondary | ICD-10-CM | POA: Diagnosis not present

## 2023-11-16 DIAGNOSIS — K5900 Constipation, unspecified: Secondary | ICD-10-CM | POA: Diagnosis present

## 2023-11-16 DIAGNOSIS — Z825 Family history of asthma and other chronic lower respiratory diseases: Secondary | ICD-10-CM

## 2023-11-16 DIAGNOSIS — I10 Essential (primary) hypertension: Secondary | ICD-10-CM | POA: Diagnosis not present

## 2023-11-16 DIAGNOSIS — N400 Enlarged prostate without lower urinary tract symptoms: Secondary | ICD-10-CM | POA: Diagnosis present

## 2023-11-16 DIAGNOSIS — M14671 Charcot's joint, right ankle and foot: Secondary | ICD-10-CM

## 2023-11-16 DIAGNOSIS — M86271 Subacute osteomyelitis, right ankle and foot: Secondary | ICD-10-CM | POA: Diagnosis not present

## 2023-11-16 DIAGNOSIS — A419 Sepsis, unspecified organism: Secondary | ICD-10-CM | POA: Diagnosis not present

## 2023-11-16 LAB — CBC
HCT: 27.9 % — ABNORMAL LOW (ref 39.0–52.0)
Hemoglobin: 9.2 g/dL — ABNORMAL LOW (ref 13.0–17.0)
MCH: 30.2 pg (ref 26.0–34.0)
MCHC: 33 g/dL (ref 30.0–36.0)
MCV: 91.5 fL (ref 80.0–100.0)
Platelets: 219 10*3/uL (ref 150–400)
RBC: 3.05 MIL/uL — ABNORMAL LOW (ref 4.22–5.81)
RDW: 15.1 % (ref 11.5–15.5)
WBC: 8 10*3/uL (ref 4.0–10.5)
nRBC: 0 % (ref 0.0–0.2)

## 2023-11-16 LAB — BASIC METABOLIC PANEL WITH GFR
Anion gap: 8 (ref 5–15)
BUN: 13 mg/dL (ref 8–23)
CO2: 23 mmol/L (ref 22–32)
Calcium: 8.1 mg/dL — ABNORMAL LOW (ref 8.9–10.3)
Chloride: 107 mmol/L (ref 98–111)
Creatinine, Ser: 0.48 mg/dL — ABNORMAL LOW (ref 0.61–1.24)
GFR, Estimated: >60 mL/min (ref >60)
Glucose, Bld: 79 mg/dL (ref 70–99)
Potassium: 3 mmol/L — ABNORMAL LOW (ref 3.5–5.1)
Sodium: 138 mmol/L (ref 135–145)

## 2023-11-16 MED ORDER — ONDANSETRON 4 MG PO TBDP: 4.0000 mg | ORAL_TABLET | Freq: Three times a day (TID) | ORAL | Status: DC | PRN

## 2023-11-16 MED ORDER — PREDNISONE 20 MG PO TABS
20.0000 mg | ORAL_TABLET | Freq: Every day | ORAL | Status: DC
  Administered 2023-11-17 – 2023-12-05 (×19): 20 mg via ORAL
  Filled 2023-11-16 (×19): qty 1

## 2023-11-16 MED ORDER — FUROSEMIDE 40 MG PO TABS
40.0000 mg | ORAL_TABLET | Freq: Every day | ORAL | Status: DC
  Administered 2023-11-16: 40 mg via ORAL
  Filled 2023-11-16: qty 1

## 2023-11-16 MED ORDER — OXYCODONE HCL 5 MG PO TABS
5.0000 mg | ORAL_TABLET | ORAL | Status: DC | PRN
  Administered 2023-11-22 – 2023-12-03 (×5): 10 mg via ORAL
  Filled 2023-11-16 (×9): qty 2

## 2023-11-16 MED ORDER — PANTOPRAZOLE SODIUM 40 MG PO TBEC
40.0000 mg | DELAYED_RELEASE_TABLET | Freq: Two times a day (BID) | ORAL | Status: DC
  Administered 2023-11-16 – 2023-12-05 (×38): 40 mg via ORAL
  Filled 2023-11-16 (×37): qty 1

## 2023-11-16 MED ORDER — IPRATROPIUM-ALBUTEROL 0.5-2.5 (3) MG/3ML IN SOLN: 3.0000 mL | Freq: Four times a day (QID) | RESPIRATORY_TRACT | Status: DC | PRN

## 2023-11-16 MED ORDER — ALUM & MAG HYDROXIDE-SIMETH 200-200-20 MG/5ML PO SUSP: 15.0000 mL | ORAL | Status: DC | PRN

## 2023-11-16 MED ORDER — MECLIZINE HCL 25 MG PO TABS: 25.0000 mg | ORAL_TABLET | Freq: Three times a day (TID) | ORAL | Status: DC | PRN

## 2023-11-16 MED ORDER — MELATONIN 5 MG PO TABS
5.0000 mg | ORAL_TABLET | Freq: Every evening | ORAL | Status: DC | PRN
  Administered 2023-11-24: 5 mg via ORAL
  Filled 2023-11-16: qty 1

## 2023-11-16 MED ORDER — TAMSULOSIN HCL 0.4 MG PO CAPS
0.4000 mg | ORAL_CAPSULE | Freq: Every day | ORAL | Status: DC
  Administered 2023-11-16 – 2023-12-04 (×19): 0.4 mg via ORAL
  Filled 2023-11-16 (×20): qty 1

## 2023-11-16 MED ORDER — POTASSIUM CHLORIDE CRYS ER 20 MEQ PO TBCR
20.0000 meq | EXTENDED_RELEASE_TABLET | Freq: Every day | ORAL | Status: DC
  Administered 2023-11-17 – 2023-11-23 (×7): 20 meq via ORAL
  Filled 2023-11-16 (×8): qty 1

## 2023-11-16 MED ORDER — ENSURE PLUS HIGH PROTEIN PO LIQD: 237.0000 mL | Freq: Two times a day (BID) | ORAL | Status: DC

## 2023-11-16 MED ORDER — FERROUS GLUCONATE 324 (38 FE) MG PO TABS
324.0000 mg | ORAL_TABLET | Freq: Every day | ORAL | Status: DC
Start: 2023-11-16 — End: 2023-12-05
  Administered 2023-11-16 – 2023-12-05 (×20): 324 mg via ORAL
  Filled 2023-11-16 (×20): qty 1

## 2023-11-16 MED ORDER — DILTIAZEM HCL ER COATED BEADS 180 MG PO CP24
180.0000 mg | ORAL_CAPSULE | Freq: Every day | ORAL | Status: DC
  Administered 2023-11-17 – 2023-12-05 (×19): 180 mg via ORAL
  Filled 2023-11-16 (×19): qty 1

## 2023-11-16 MED ORDER — POLYETHYLENE GLYCOL 3350 17 G PO PACK: 17.0000 g | PACK | Freq: Every day | ORAL | Status: DC

## 2023-11-16 MED ORDER — DIPHENHYDRAMINE HCL 25 MG PO CAPS: 25.0000 mg | ORAL_CAPSULE | Freq: Four times a day (QID) | ORAL | Status: DC | PRN

## 2023-11-16 MED ORDER — BUTALBITAL-APAP-CAFFEINE 50-325-40 MG PO TABS: 1.0000 | ORAL_TABLET | Freq: Four times a day (QID) | ORAL | Status: DC | PRN

## 2023-11-16 MED ORDER — CYCLOBENZAPRINE HCL 10 MG PO TABS
10.0000 mg | ORAL_TABLET | Freq: Three times a day (TID) | ORAL | Status: DC | PRN
  Administered 2023-11-18 – 2023-12-04 (×12): 10 mg via ORAL
  Filled 2023-11-16 (×12): qty 1

## 2023-11-16 MED ORDER — OXYCODONE HCL 5 MG PO TABS
10.0000 mg | ORAL_TABLET | ORAL | Status: DC | PRN
  Administered 2023-11-19 – 2023-11-28 (×7): 10 mg via ORAL
  Administered 2023-11-29: 15 mg via ORAL
  Administered 2023-11-30: 10 mg via ORAL
  Administered 2023-12-04: 15 mg via ORAL
  Filled 2023-11-16: qty 2
  Filled 2023-11-16: qty 3
  Filled 2023-11-16 (×5): qty 2
  Filled 2023-11-16: qty 3
  Filled 2023-11-16: qty 2

## 2023-11-16 MED ORDER — CYANOCOBALAMIN 1000 MCG/ML IJ SOLN: 1000.0000 ug | INTRAMUSCULAR | Status: DC

## 2023-11-16 MED ORDER — OXYCODONE HCL 5 MG PO TABS: 5.0000 mg | ORAL_TABLET | Freq: Four times a day (QID) | ORAL | Status: DC | PRN

## 2023-11-16 MED ORDER — DOCUSATE SODIUM 100 MG PO CAPS
100.0000 mg | ORAL_CAPSULE | Freq: Two times a day (BID) | ORAL | Status: DC
  Administered 2023-11-16 – 2023-11-19 (×7): 100 mg via ORAL
  Filled 2023-11-16 (×8): qty 1

## 2023-11-16 MED ORDER — POLYETHYLENE GLYCOL 3350 17 G PO PACK
17.0000 g | PACK | Freq: Every day | ORAL | Status: DC
  Administered 2023-11-17 – 2023-11-19 (×3): 17 g via ORAL
  Filled 2023-11-16 (×4): qty 1

## 2023-11-16 MED ORDER — GUAIFENESIN-DM 100-10 MG/5ML PO SYRP: 15.0000 mL | ORAL_SOLUTION | ORAL | Status: DC | PRN

## 2023-11-16 MED ORDER — PREGABALIN 50 MG PO CAPS
100.0000 mg | ORAL_CAPSULE | Freq: Two times a day (BID) | ORAL | Status: DC
  Administered 2023-11-16 – 2023-12-05 (×38): 100 mg via ORAL
  Filled 2023-11-16 (×27): qty 2
  Filled 2023-11-16: qty 4
  Filled 2023-11-16 (×10): qty 2

## 2023-11-16 MED ORDER — POTASSIUM CHLORIDE CRYS ER 20 MEQ PO TBCR: 20.0000 meq | EXTENDED_RELEASE_TABLET | Freq: Every day | ORAL | Status: DC

## 2023-11-16 MED ORDER — FUROSEMIDE 40 MG PO TABS
40.0000 mg | ORAL_TABLET | Freq: Every day | ORAL | Status: DC
  Administered 2023-11-17 – 2023-11-19 (×3): 40 mg via ORAL
  Filled 2023-11-16 (×3): qty 1

## 2023-11-16 MED ORDER — DICLOFENAC SODIUM 1 % EX GEL: 4.0000 g | Freq: Four times a day (QID) | CUTANEOUS | Status: DC | PRN

## 2023-11-16 MED ORDER — ATORVASTATIN CALCIUM 10 MG PO TABS
20.0000 mg | ORAL_TABLET | Freq: Every day | ORAL | Status: DC
  Administered 2023-11-17 – 2023-12-05 (×19): 20 mg via ORAL
  Filled 2023-11-16 (×19): qty 2

## 2023-11-16 MED ORDER — ARFORMOTEROL TARTRATE 15 MCG/2ML IN NEBU
15.0000 ug | INHALATION_SOLUTION | Freq: Two times a day (BID) | RESPIRATORY_TRACT | Status: DC
  Administered 2023-11-16 – 2023-12-05 (×32): 15 ug via RESPIRATORY_TRACT
  Filled 2023-11-16 (×37): qty 2

## 2023-11-16 MED ORDER — BUDESONIDE 0.5 MG/2ML IN SUSP
0.2500 mg | Freq: Two times a day (BID) | RESPIRATORY_TRACT | Status: DC
  Administered 2023-11-16 – 2023-12-05 (×34): 0.25 mg via RESPIRATORY_TRACT
  Filled 2023-11-16 (×38): qty 2

## 2023-11-16 MED ORDER — APIXABAN 5 MG PO TABS
5.0000 mg | ORAL_TABLET | Freq: Two times a day (BID) | ORAL | Status: DC
  Administered 2023-11-16: 5 mg via ORAL
  Filled 2023-11-16: qty 1

## 2023-11-16 MED ORDER — ACETAMINOPHEN 325 MG PO TABS: 325.0000 mg | ORAL_TABLET | Freq: Four times a day (QID) | ORAL | Status: DC | PRN

## 2023-11-16 MED ORDER — ENSURE PLUS HIGH PROTEIN PO LIQD
237.0000 mL | Freq: Two times a day (BID) | ORAL | Status: DC
Start: 2023-11-16 — End: 2023-11-22
  Administered 2023-11-17 – 2023-11-22 (×6): 237 mL via ORAL

## 2023-11-16 MED ORDER — PREDNISONE 20 MG PO TABS
20.0000 mg | ORAL_TABLET | Freq: Every day | ORAL | Status: DC
  Administered 2023-11-16: 20 mg via ORAL
  Filled 2023-11-16: qty 1

## 2023-11-16 MED ORDER — POTASSIUM CHLORIDE CRYS ER 20 MEQ PO TBCR
40.0000 meq | EXTENDED_RELEASE_TABLET | Freq: Once | ORAL | Status: AC
  Administered 2023-11-16: 40 meq via ORAL
  Filled 2023-11-16 (×2): qty 2

## 2023-11-16 MED ORDER — PHENOL 1.4 % MT LIQD: 1.0000 | OROMUCOSAL | Status: DC | PRN

## 2023-11-16 MED ORDER — CYANOCOBALAMIN 1000 MCG/ML IJ SOLN
1000.0000 ug | INTRAMUSCULAR | Status: DC
  Administered 2023-11-17 – 2023-12-01 (×3): 1000 ug via SUBCUTANEOUS
  Filled 2023-11-16 (×3): qty 1

## 2023-11-16 MED ORDER — APIXABAN 5 MG PO TABS
5.0000 mg | ORAL_TABLET | Freq: Two times a day (BID) | ORAL | Status: DC
  Administered 2023-11-16 – 2023-12-05 (×38): 5 mg via ORAL
  Filled 2023-11-16 (×38): qty 1

## 2023-11-16 NOTE — Plan of Care (Signed)
  Problem: Education: Goal: Knowledge of General Education information will improve Description: Including pain rating scale, medication(s)/side effects and non-pharmacologic comfort measures Outcome: Progressing   Problem: Health Behavior/Discharge Planning: Goal: Ability to manage health-related needs will improve Outcome: Progressing   Problem: Clinical Measurements: Goal: Ability to maintain clinical measurements within normal limits will improve Outcome: Progressing Goal: Will remain free from infection Outcome: Progressing Goal: Diagnostic test results will improve Outcome: Progressing Goal: Respiratory complications will improve Outcome: Progressing Goal: Cardiovascular complication will be avoided Outcome: Progressing   Problem: Activity: Goal: Risk for activity intolerance will decrease Outcome: Progressing   Problem: Nutrition: Goal: Adequate nutrition will be maintained Outcome: Progressing   Problem: Coping: Goal: Level of anxiety will decrease Outcome: Progressing   Problem: Elimination: Goal: Will not experience complications related to bowel motility Outcome: Progressing Goal: Will not experience complications related to urinary retention Outcome: Progressing   Problem: Pain Managment: Goal: General experience of comfort will improve and/or be controlled Outcome: Progressing   Problem: Safety: Goal: Ability to remain free from injury will improve Outcome: Progressing   Problem: Skin Integrity: Goal: Risk for impaired skin integrity will decrease Outcome: Progressing   Problem: Education: Goal: Knowledge of the prescribed therapeutic regimen will improve Outcome: Progressing Goal: Ability to verbalize activity precautions or restrictions will improve Outcome: Progressing Goal: Understanding of discharge needs will improve Outcome: Progressing   Problem: Activity: Goal: Ability to perform//tolerate increased activity and mobilize with assistive  devices will improve Outcome: Progressing   Problem: Clinical Measurements: Goal: Postoperative complications will be avoided or minimized Outcome: Progressing   Problem: Self-Care: Goal: Ability to meet self-care needs will improve Outcome: Progressing   Problem: Self-Concept: Goal: Ability to maintain and perform role responsibilities to the fullest extent possible will improve Outcome: Progressing   Problem: Pain Management: Goal: Pain level will decrease with appropriate interventions Outcome: Progressing   Problem: Skin Integrity: Goal: Demonstration of wound healing without infection will improve Outcome: Progressing

## 2023-11-16 NOTE — Progress Notes (Addendum)
 Inpatient Rehab Admissions Coordinator:    I have a CIR bed for this Pt. Today. RN may call report to [phone].    Pt. To admit to CIR for an estimated 16-18 days with the goal of discharging home at min-mod A level with assistance from his wife.    Leita Kleine, MS, CCC-SLP Rehab Admissions Coordinator  [phone] (celll) [phone] (office)

## 2023-11-16 NOTE — Progress Notes (Signed)
 PROGRESS NOTE        PATIENT DETAILS Name: Levi [name] Age: 78 y.o. Sex: male Date of Birth: March 24, 1946 Admit Date: 11/08/2023 Admitting Physician Evalene GORMAN Sprinkles, MD ERE:[id number], Debby PARAS, MD  Brief Summary: Patient is a 78 y.o.  male with history of PAF on Eliquis , HFmrEF, HTN, HLD, COPD-presented with presyncope and sepsis secondary to right foot infection with acute osteomyelitis.  Significant events: 6/14>> admit to [facility] 6/20>> right BKA  Significant studies: 6/14>> MRI right foot: New high-grade ulceration within the lateral midfoot-with sinus tract extending superiorly to the distal lateral aspect of cuboid.  Osteomyelitis of plantar lateral cuboid and base of fifth metatarsal.  Significant microbiology data: 6/14>> blood culture: Negative  Procedures: 6/20>> right BKA  Consults: Orthopedics Infectious disease  Subjective: Lying comfortably in bed-denies any chest pain or shortness of breath.  Objective: Vitals: Blood pressure (!) 143/74, pulse 94, temperature 97.6 F (36.4 C), temperature source Oral, resp. rate 14, height (P) 6' 2 (1.88 m), weight (P) 93 kg, SpO2 100%.   Exam: Gen Exam:Alert awake-not in any distress HEENT:atraumatic, normocephalic Chest: B/L clear to auscultation anteriorly CVS:S1S2 regular Abdomen:soft non tender, non distended Extremities:no left leg edema-s/p right BKA. Neurology: Non focal Skin: no rash  Pertinent Labs/Radiology:    Latest Ref Rng & Units 11/16/2023    6:56 AM 11/15/2023    7:44 AM 11/14/2023    7:22 AM  CBC  WBC 4.0 - 10.5 K/uL 8.0  11.4  7.3   Hemoglobin 13.0 - 17.0 g/dL 9.2  9.0  89.8   Hematocrit 39.0 - 52.0 % 27.9  27.6  30.9   Platelets 150 - 400 K/uL 219  191  180     Lab Results  Component Value Date   NA 138 11/16/2023   K 3.0 (L) 11/16/2023   CL 107 11/16/2023   CO2 23 11/16/2023      Assessment/Plan: Sepsis secondary to right foot infection with acute  osteomyelitis Sepsis physiology resolved with IV antibiotics and right BKA on 6/20 Orthopedics recommending discharge with Prevena plus portable wound VAC pump-and to follow-up with Dr. Harden in 1 week after discharge. Pain well-controlled with Lyrica  and as needed oral narcotics.  Presyncope Likely secondary to sepsis physiology Telemetry monitoring  PAF Resume Eliquis  Continue Cardizem  Telemetry monitoring  Chronic HFmrEF (EF 40-45% by TEE on 10/28) Euvolemic Resume Lasix   HTN BP relatively stable Continue diltiazem  Imdur  remains on hold  COPD Not in exacerbation Continue bronchodilators Has been on chronic prednisone  for 14 years-resume home dose 20 mg daily.  Hypokalemia Replete/recheck  Normocytic anemia Mild Likely secondary to acute illness-no evidence of blood loss. Follow CBC.  BPH Flomax   Code status:   Code Status: Full Code   DVT Prophylaxis: SCD's Start: 11/14/23 1651 apixaban  (ELIQUIS ) tablet 5 mg    Family Communication: None at bedside   Disposition Plan: Status is: Inpatient Remains inpatient appropriate because: Severity of illness   Planned Discharge Destination:CIR vs SNF   Diet: Diet Order             Diet Heart Room service appropriate? Yes; Fluid consistency: Thin  Diet effective now                     Antimicrobial agents: Anti-infectives (From admission, onward)    Start     Dose/Rate Route  Frequency Ordered Stop   11/14/23 1300  ceFAZolin  (ANCEF ) IVPB 2g/100 mL premix        2 g 200 mL/hr over 30 Minutes Intravenous On call to O.R. 11/14/23 1242 11/14/23 1421   11/14/23 1211  ceFAZolin  (ANCEF ) 2-4 GM/100ML-% IVPB       Note to Pharmacy: Carolynn Friday E: cabinet override      11/14/23 1211 11/14/23 1414   11/11/23 1445  vancomycin  (VANCOREADY) IVPB 1250 mg/250 mL  Status:  Discontinued        1,250 mg 166.7 mL/hr over 90 Minutes Intravenous Every 12 hours 11/11/23 1354 11/15/23 1021   11/09/23 1015   vancomycin  (VANCOREADY) IVPB 1250 mg/250 mL  Status:  Discontinued        1,250 mg 166.7 mL/hr over 90 Minutes Intravenous Every 12 hours 11/09/23 0950 11/11/23 1354   11/09/23 1000  Vancomycin  (VANCOCIN ) 1,250 mg in sodium chloride  0.9 % 250 mL IVPB  Status:  Discontinued        1,250 mg 166.7 mL/hr over 90 Minutes Intravenous Every 12 hours 11/09/23 0929 11/09/23 0950   11/09/23 0700  vancomycin  (VANCOREADY) IVPB 2000 mg/400 mL  Status:  Discontinued        2,000 mg 200 mL/hr over 120 Minutes Intravenous Every 24 hours 11/08/23 1113 11/09/23 0929   11/08/23 1300  ceFEPIme  (MAXIPIME ) 2 g in sodium chloride  0.9 % 100 mL IVPB        2 g 200 mL/hr over 30 Minutes Intravenous Every 8 hours 11/08/23 1113 11/15/23 2000   11/08/23 0600  vancomycin  (VANCOREADY) IVPB 2000 mg/400 mL        2,000 mg 200 mL/hr over 120 Minutes Intravenous  Once 11/08/23 0530 11/08/23 1034   11/08/23 0530  vancomycin  (VANCOCIN ) IVPB 1000 mg/200 mL premix  Status:  Discontinued        1,000 mg 200 mL/hr over 60 Minutes Intravenous  Once 11/08/23 0518 11/08/23 0529   11/08/23 0530  ceFEPIme  (MAXIPIME ) 2 g in sodium chloride  0.9 % 100 mL IVPB        2 g 200 mL/hr over 30 Minutes Intravenous STAT 11/08/23 0518 11/08/23 0635   11/08/23 0530  metroNIDAZOLE  (FLAGYL ) IVPB 500 mg        500 mg 100 mL/hr over 60 Minutes Intravenous  Once 11/08/23 0518 11/08/23 0659        MEDICATIONS: Scheduled Meds:  apixaban   5 mg Oral BID   arformoterol   15 mcg Nebulization BID   atorvastatin   20 mg Oral Daily   budesonide  (PULMICORT ) nebulizer solution  0.25 mg Nebulization BID   cyanocobalamin   1,000 mcg Subcutaneous Daily   Followed by   NOREEN ON 11/17/2023] cyanocobalamin   1,000 mcg Subcutaneous Weekly   diltiazem   180 mg Oral Daily   docusate sodium   100 mg Oral BID   feeding supplement  237 mL Oral BID BM   furosemide   40 mg Oral Daily   pantoprazole   40 mg Oral BID   polyethylene glycol  17 g Oral Daily   [START ON  11/17/2023] potassium chloride   20 mEq Oral Daily   potassium chloride   40 mEq Oral Once   predniSONE   20 mg Oral Q breakfast   pregabalin   100 mg Oral BID   tamsulosin   0.4 mg Oral QHS   Continuous Infusions:  magnesium  sulfate bolus IVPB     PRN Meds:.acetaminophen , alum & mag hydroxide-simeth, butalbital -acetaminophen -caffeine , diclofenac  Sodium, guaiFENesin -dextromethorphan, hydrALAZINE , HYDROmorphone  (DILAUDID ) injection, ipratropium-albuterol , labetalol , magnesium  sulfate bolus IVPB,  metoprolol  tartrate, ondansetron , oxyCODONE , oxyCODONE , oxyCODONE -acetaminophen , phenol   I have personally reviewed following labs and imaging studies  LABORATORY DATA: CBC: Recent Labs  Lab 11/12/23 0547 11/13/23 0627 11/14/23 0722 11/15/23 0744 11/16/23 0656  WBC 5.9 6.1 7.3 11.4* 8.0  HGB 9.4* 9.1* 10.1* 9.0* 9.2*  HCT 29.1* 27.9* 30.9* 27.6* 27.9*  MCV 94.5 93.0 94.2 92.9 91.5  PLT 152 162 180 191 219    Basic Metabolic Panel: Recent Labs  Lab 11/10/23 0551 11/11/23 0513 11/12/23 0547 11/13/23 0627 11/14/23 0722 11/15/23 0744 11/16/23 0656  NA 133*   < > 132* 134* 136 134* 138  K 3.4*   < > 3.4* 3.4* 4.2 3.8 3.0*  CL 105   < > 104 103 106 105 107  CO2 22   < > 21* 20* 22 21* 23  GLUCOSE 105*   < > 75 78 89 118* 79  BUN 15   < > 13 11 15 15 13   CREATININE 0.81   < > 0.64 0.65 0.67 0.54* 0.48*  CALCIUM  7.5*   < > 7.9* 8.0* 8.2* 8.3* 8.1*  MG 1.7  --   --   --   --   --   --   PHOS 2.2*  --   --   --   --   --   --    < > = values in this interval not displayed.    GFR: Estimated Creatinine Clearance: 91.3 mL/min (A) (by C-G formula based on SCr of 0.48 mg/dL (L)).  Liver Function Tests: No results for input(s): AST, ALT, ALKPHOS, BILITOT, PROT, ALBUMIN in the last 168 hours. No results for input(s): LIPASE, AMYLASE in the last 168 hours. No results for input(s): AMMONIA in the last 168 hours.  Coagulation Profile: No results for input(s): INR,  PROTIME in the last 168 hours.  Cardiac Enzymes: No results for input(s): CKTOTAL, CKMB, CKMBINDEX, TROPONINI in the last 168 hours.  BNP (last 3 results) No results for input(s): PROBNP in the last 8760 hours.  Lipid Profile: No results for input(s): CHOL, HDL, LDLCALC, TRIG, CHOLHDL, LDLDIRECT in the last 72 hours.  Thyroid  Function Tests: No results for input(s): TSH, T4TOTAL, FREET4, T3FREE, THYROIDAB in the last 72 hours.  Anemia Panel: No results for input(s): VITAMINB12, FOLATE, FERRITIN, TIBC, IRON , RETICCTPCT in the last 72 hours.  Urine analysis:    Component Value Date/Time   COLORURINE YELLOW 11/08/2023 0747   APPEARANCEUR CLEAR 11/08/2023 0747   APPEARANCEUR Cloudy (A) 11/03/2023 1113   LABSPEC 1.012 11/08/2023 0747   PHURINE 7.0 11/08/2023 0747   GLUCOSEU NEGATIVE 11/08/2023 0747   HGBUR NEGATIVE 11/08/2023 0747   BILIRUBINUR NEGATIVE 11/08/2023 0747   BILIRUBINUR Negative 11/03/2023 1113   KETONESUR NEGATIVE 11/08/2023 0747   PROTEINUR NEGATIVE 11/08/2023 0747   UROBILINOGEN 0.2 05/02/2022 0916   UROBILINOGEN 0.2 04/21/2012 0936   NITRITE NEGATIVE 11/08/2023 0747   LEUKOCYTESUR NEGATIVE 11/08/2023 0747    Sepsis Labs: Lactic Acid, Venous    Component Value Date/Time   LATICACIDVEN 2.4 (HH) 11/08/2023 1259    MICROBIOLOGY: Recent Results (from the past 240 hours)  Culture, blood (Routine x 2)     Status: None   Collection Time: 11/08/23  5:14 AM   Specimen: BLOOD RIGHT FOREARM  Result Value Ref Range Status   Specimen Description BLOOD RIGHT FOREARM  Final   Special Requests   Final    BOTTLES DRAWN AEROBIC AND ANAEROBIC Blood Culture adequate volume   Culture  Final    NO GROWTH 5 DAYS Performed at [facility], [address], [place], [place] [address]    Report Status 11/13/2023 FINAL  Final  Culture, blood (Routine x 2)     Status: None   Collection Time: 11/08/23  5:53 AM   Specimen:  BLOOD  Result Value Ref Range Status   Specimen Description BLOOD BLOOD RIGHT ARM  Final   Special Requests   Final    BOTTLES DRAWN AEROBIC AND ANAEROBIC Blood Culture adequate volume   Culture   Final    NO GROWTH 5 DAYS Performed at [facility], [address] [place], [place] [address]    Report Status 11/13/2023 FINAL  Final    RADIOLOGY STUDIES/RESULTS: No results found.   LOS: 8 days   Donalda Applebaum, MD  [facility]    To contact the attending provider between 7A-7P or the covering provider during after hours 7P-7A, please log into the web site [email or web address] and access using universal [place] password for that web site. If you do not have the password, please call the hospital operator.  11/16/2023, 8:30 AM

## 2023-11-16 NOTE — PMR Pre-admission (Signed)
 PMR Admission Coordinator Pre-Admission Assessment  Patient: Levi [name] is an 78 y.o., male MRN: [id number] DOB: 01-Jul-1945 Height: (P) 6' 2 (188 cm) Weight: (P) 93 kg  Insurance Information HMO:    PPO:      PCP:      IPA:      80/20:yes     OTHER:  PRIMARY: Medicare a and b      Policy#: [phone]      Subscriber: pt Benefits:  Phone #: passport one source     Name: 6/29 Eff. Date: 03/27/2012     Deduct: $1676      Out of Pocket Max: none      Life Max: none CIR: 100%      SNF: 20 dull days Outpatient: 80%     Co-Pay: 20% Home Health: 100%      Co-Pay:  DME: 80%     Co-Pay: 20% Providers: pt choice SECONDARY: BCBS  other    Policy#:  [id number]       Phone#:   Financial Counselor:       Phone#:   The "Data Collection Information Summary" for patients in Inpatient Rehabilitation Facilities with attached "Privacy Act Statement-Health Care Records" was provided and verbally reviewed with: Patient  Emergency Contact Information Contact Information     Name Relation Home Work Mobile   [place] Spouse [phone]  [phone]   Delellis,Sally Daughter [phone]  [phone]      Other Contacts   None on File     Current Medical History  Patient Admitting Diagnosis: BKA History of Present Illness: Levi [name] is a 78 y.o. male with medical history significant of PAF on Eliquis , HFpEF (EF 40-45% in 02/2023), HTN, HLD, COPD, BPH, GERD, and s/p recent LLE 2nd digit/RLE  5th digit ray amputation on 08/13/2023 who presented to [facility] ED 11/08/23 with presyncopal episode  and sepsis 2/2 RLE cellulitis. In the ED, pt tachycardic, and  tachypneic on RA. Labs notable for K 2.9, Cr 1.05, albumin 2.9, WBC 13.8, and lactate 2.4. CTH NAICA. CT C-spine no acute abnl. CXR no acute cardiopulmonary findings. CR R foot CT showed moderate edema, advanced Charcot foot, and old fifth toe amputation and remote resection of the second through fourth proximal  phalangeal heads. Pt received IV vancomycin , IV cefepime , and IV metronidazole  in the ED and was admitted to medicine for sepsis. Found to have right foot infection with acute osteomyelitis. Pt underwent  right BKA on 11/14/23 with orthopedics.Orthopedics recommending discharge with Prevena plus portable wound VAC pump-and to follow-up with Dr. Harden in 1 week after discharge. Pt. Was seen by PT/OT post operatively and they recommended CIR to assist return to PLOF.   Patient's medical record from [facility]  has been reviewed by the rehabilitation admission coordinator and physician.  Past Medical History  Past Medical History:  Diagnosis Date   Agent orange exposure 1970's   takes Imdur ,Diltiazem , and Atacand  daily   Agent orange exposure    ALLERGIC RHINITIS    Allergy     seasonal allergies   Anemia    iron  deficiency   Arthritis    Asthma    uses inhaler and nebulizers   Atrial fibrillation (HCC)    Bruises easily    d/t prednisone  daily   Cataract    Cellulitis 03/22/2020   Chest wall pain 09/21/2019   L scapula, reported 4/27 21/     CHF (congestive heart failure) (HCC)    dx-on  meds to treat   Chronic bronchitis (HCC)    used to get it q yr; last time ?2008 (04/28/2012)   Clotting disorder (HCC) 2019   On blood thinner when I bleed it is for a consideral amount of time. This is tegardless of size of wound.   COPD (chronic obstructive pulmonary disease) (HCC)    agent orange exposure   Degenerative disk disease    everywhere (04/28/2012)   Diverticulitis    Dysrhythmia    afib   Elevated uric acid in blood    takes Allopurinol  daily   Emphysema of lung (HCC)    Enlarged prostate    but not on any meds   GERD (gastroesophageal reflux disease)    takes Protonix  daily   H/O hiatal hernia    Headache    History of colonoscopy    History of pneumonia    a. 2010   Hyperlipidemia    takes Lipitor daily; pt. states he takes a preventive    Hypothyroidism    Joint pain    Joint swelling    Leukocytosis    MSSA bacteremia 03/19/2023   Nausea and vomiting 11/12/2022   Neuromuscular disorder (HCC)    bilat legs, and bilat arms   Neuropathy    Osteoporosis 2012   PAF (paroxysmal atrial fibrillation) (HCC)    a. Dx 04/2012, CHA2DS2VASc = 1 (age);  b. 04/2012 Echo: EF 40-50%, mild MR.   Personal history of colonic adenomas 02/09/2013   Pneumonia    Sepsis (HCC) 03/18/2023   Shortness of breath dyspnea    Thyroid  disease    no taking meds-caused dizziness    Has the patient had major surgery during 100 days prior to admission? Yes  Family History   family history includes Asthma in his brother; COPD in his brother; Cancer in his brother and father; Colon cancer in his brother and another family member; Colon polyps in his brother and brother; Diabetes in his mother; Heart disease in his father; Hyperlipidemia in his father; Prostate cancer in his brother; Stroke in his father.  Current Medications  Current Facility-Administered Medications:    acetaminophen  (TYLENOL ) tablet 325-650 mg, 325-650 mg, Oral, Q6H PRN, Gerome Maurilio HERO, PA-C, 500 mg at 11/15/23 9390   alum & mag hydroxide-simeth (MAALOX/MYLANTA) 200-200-20 MG/5ML suspension 15-30 mL, 15-30 mL, Oral, Q2H PRN, Gerome, Emma M, PA-C   apixaban  (ELIQUIS ) tablet 5 mg, 5 mg, Oral, BID, Ghimire, Donalda HERO, MD   arformoterol  (BROVANA ) nebulizer solution 15 mcg, 15 mcg, Nebulization, BID, Moore, Willie, MD, 15 mcg at 11/16/23 0750   atorvastatin  (LIPITOR) tablet 20 mg, 20 mg, Oral, Daily, Georgina Basket, MD, 20 mg at 11/16/23 9153   budesonide  (PULMICORT ) nebulizer solution 0.25 mg, 0.25 mg, Nebulization, BID, Georgina Basket, MD, 0.25 mg at 11/16/23 9251   butalbital -acetaminophen -caffeine  (FIORICET ) 50-325-40 MG per tablet 1 tablet, 1 tablet, Oral, Q6H PRN, Elgergawy, Brayton RAMAN, MD, 1 tablet at 11/11/23 1647   [COMPLETED] cyanocobalamin  (VITAMIN B12) injection 1,000 mcg, 1,000  mcg, Subcutaneous, Daily, 1,000 mcg at 11/16/23 0849 **FOLLOWED BY** [START ON 11/17/2023] cyanocobalamin  (VITAMIN B12) injection 1,000 mcg, 1,000 mcg, Subcutaneous, Weekly, Elgergawy, Dawood S, MD   diclofenac  Sodium (VOLTAREN ) 1 % topical gel 4 g, 4 g, Topical, Q4H PRN, Lue Elsie BROCKS, MD   diltiazem  (CARDIZEM  CD) 24 hr capsule 180 mg, 180 mg, Oral, Daily, Georgina Basket, MD, 180 mg at 11/16/23 0846   docusate sodium  (COLACE) capsule 100 mg, 100 mg, Oral, BID, Elgergawy, Dawood S,  MD, 100 mg at 11/16/23 0847   feeding supplement (ENSURE PLUS HIGH PROTEIN) liquid 237 mL, 237 mL, Oral, BID BM, Elgergawy, Dawood S, MD, 237 mL at 11/16/23 0847   furosemide  (LASIX ) tablet 40 mg, 40 mg, Oral, Daily, Ghimire, Donalda HERO, MD   guaiFENesin -dextromethorphan (ROBITUSSIN DM) 100-10 MG/5ML syrup 15 mL, 15 mL, Oral, Q4H PRN, Gerome, Emma M, PA-C   hydrALAZINE  (APRESOLINE ) injection 5 mg, 5 mg, Intravenous, Q20 Min PRN, Collins, Emma M, PA-C   HYDROmorphone  (DILAUDID ) injection 0.5-1 mg, 0.5-1 mg, Intravenous, Q4H PRN, Gerome, Emma M, PA-C, 1 mg at 11/14/23 2142   ipratropium-albuterol  (DUONEB) 0.5-2.5 (3) MG/3ML nebulizer solution 3 mL, 3 mL, Nebulization, Q6H PRN, Georgina Basket, MD   labetalol  (NORMODYNE ) injection 10 mg, 10 mg, Intravenous, Q10 min PRN, Gerome, Emma M, PA-C   magnesium  sulfate IVPB 2 g 50 mL, 2 g, Intravenous, Daily PRN, Gerome, Emma M, PA-C   metoprolol  tartrate (LOPRESSOR ) injection 2-5 mg, 2-5 mg, Intravenous, Q2H PRN, Gerome, Emma M, PA-C   ondansetron  (ZOFRAN ) injection 4 mg, 4 mg, Intravenous, Q6H PRN, Gerome, Emma M, PA-C   oxyCODONE  (Oxy IR/ROXICODONE ) immediate release tablet 10-15 mg, 10-15 mg, Oral, Q4H PRN, Gerome, Emma M, PA-C   oxyCODONE  (Oxy IR/ROXICODONE ) immediate release tablet 5-10 mg, 5-10 mg, Oral, Q4H PRN, Gerome, Emma M, PA-C, 10 mg at 11/15/23 2141   oxyCODONE -acetaminophen  (PERCOCET/ROXICET) 5-325 MG per tablet 1 tablet, 1 tablet, Oral, Q4H PRN, Georgina Basket, MD, 1 tablet at 11/16/23 0846   pantoprazole  (PROTONIX ) EC tablet 40 mg, 40 mg, Oral, BID, Georgina Basket, MD, 40 mg at 11/16/23 0846   phenol (CHLORASEPTIC) mouth spray 1 spray, 1 spray, Mouth/Throat, PRN, Gerome, Emma M, PA-C   polyethylene glycol (MIRALAX  / GLYCOLAX ) packet 17 g, 17 g, Oral, Daily, Elgergawy, Dawood S, MD, 17 g at 11/16/23 0848   [START ON 11/17/2023] potassium chloride  SA (KLOR-CON  M) CR tablet 20 mEq, 20 mEq, Oral, Daily, Ghimire, Donalda HERO, MD   potassium chloride  SA (KLOR-CON  M) CR tablet 40 mEq, 40 mEq, Oral, Once, Ghimire, Donalda HERO, MD   predniSONE  (DELTASONE ) tablet 20 mg, 20 mg, Oral, Q breakfast, Ghimire, Donalda HERO, MD   pregabalin  (LYRICA ) capsule 100 mg, 100 mg, Oral, BID, Georgina Basket, MD, 100 mg at 11/16/23 9153   tamsulosin  (FLOMAX ) capsule 0.4 mg, 0.4 mg, Oral, QHS, Georgina Basket, MD, 0.4 mg at 11/15/23 2142  Patients Current Diet:  Diet Order             Diet - low sodium heart healthy           Diet Heart Room service appropriate? Yes; Fluid consistency: Thin  Diet effective now                   Precautions / Restrictions Precautions Precautions: Fall Other Brace: RLE ampushield Restrictions Weight Bearing Restrictions Per Provider Order: Yes RLE Weight Bearing Per Provider Order: Non weight bearing Other Position/Activity Restrictions: no pillow under R knee   Has the patient had 2 or more falls or a fall with injury in the past year? Yes  Prior Activity Level Community (5-7x/wk): pt active in the community PTA  Prior Functional Level Self Care: Did the patient need help bathing, dressing, using the toilet or eating? Needed some help  Indoor Mobility: Did the patient need assistance with walking from room to room (with or without device)? Needed some help  Stairs: Did the patient need assistance with internal or external stairs (with or  without device)? Needed some help  Functional Cognition: Did the patient need help  planning regular tasks such as shopping or remembering to take medications? Independent  Patient Information Are you of Hispanic, Latino/a,or Spanish origin?: A. No, not of Hispanic, Latino/a, or Spanish origin What is your race?: A. White Do you need or want an interpreter to communicate with a doctor or health care staff?: 0. No  Patient's Response To:  Health Literacy and Transportation Is the patient able to respond to health literacy and transportation needs?: Yes Health Literacy - How often do you need to have someone help you when you read instructions, pamphlets, or other written material from your doctor or pharmacy?: Never In the past 12 months, has lack of transportation kept you from medical appointments or from getting medications?: No In the past 12 months, has lack of transportation kept you from meetings, work, or from getting things needed for daily living?: No  Journalist, newspaper / Equipment Home Equipment: Shower seat, BSC/3in1, Agricultural consultant (2 wheels), [employer] - single point, Rollator (4 wheels)  Prior Device Use: Indicate devices/aids used by the patient prior to current illness, exacerbation or injury? Walker  Current Functional Level Cognition  Orientation Level: Oriented X4    Extremity Assessment (includes Sensation/Coordination)  Upper Extremity Assessment: Defer to OT evaluation RUE Deficits / Details: limited B shoulder ROM, some stiffness in fingers but overall functional RUE Sensation: WNL RUE Coordination: WNL LUE Deficits / Details: limited B shoulder ROM, some stiffness in fingers but overall functional LUE Sensation: WNL LUE Coordination: WNL  Lower Extremity Assessment: Generalized weakness, RLE deficits/detail RLE Deficits / Details: Shrinker sock in place, vac running at residual limb amputation site. RLE: Unable to fully assess due to pain RLE Sensation: history of peripheral neuropathy LLE Deficits / Details: unable to accept resistance  due to pain; full AROM knee extension/flexion, limited ROM DF/PF due to pain LLE: Unable to fully assess due to pain LLE Sensation: history of peripheral neuropathy    ADLs  Overall ADL's : Needs assistance/impaired Eating/Feeding: Independent Grooming: Set up, Sitting Upper Body Bathing: Minimal assistance, Sitting Lower Body Bathing: Maximal assistance, Sitting/lateral leans Upper Body Dressing : Minimal assistance, Sitting Lower Body Dressing: Maximal assistance, Sitting/lateral leans Lower Body Dressing Details (indicate cue type and reason): assist for shoes, relies on BUE support in standing Toilet Transfer: Maximal assistance, +2 for physical assistance, +2 for safety/equipment, BSC/3in1, Rolling walker (2 wheels) Toileting- Clothing Manipulation and Hygiene: Moderate assistance, Sitting/lateral lean Functional mobility during ADLs: Minimal assistance, Rolling walker (2 wheels) General ADL Comments: Pt requires help for LB ADLs at baseline, min A for UB dressing/bathing. Pt stands with max A x2 assist, increased effort for pivoting to recliner, will do well with Stedy    Mobility  Overal bed mobility: Needs Assistance Bed Mobility: Supine to Sit Supine to sit: Min assist Sit to supine: Mod assist General bed mobility comments: min A with HOB elevated for sitting up and scooting to EOB. Cues for technique. Ampushield in place.    Transfers  Overall transfer level: Needs assistance Equipment used: Rolling walker (2 wheels) Transfers: Sit to/from Stand, Bed to chair/wheelchair/BSC Sit to Stand: Max assist, +2 physical assistance, +2 safety/equipment Bed to/from chair/wheelchair/BSC transfer type:: Stand pivot Stand pivot transfers: Max assist, +2 physical assistance, +2 safety/equipment General transfer comment: max A +2  for boost to stand. Cues for technique and hand placement. Max A +2 for pivot to recliner, sequencing of RW and to balance pt. No  buckling, unable to hop. heavy  reliance on BUEs to pivot L foot.    Ambulation / Gait / Stairs / Wheelchair Mobility  Ambulation/Gait Ambulation/Gait assistance: Min assist, +2 safety/equipment Gait Distance (Feet): 5 Feet Assistive device: Rolling walker (2 wheels) Gait Pattern/deviations: Decreased step length - right, Decreased step length - left, Decreased stance time - right, Decreased weight shift to right, Trunk flexed, Wide base of support, Step-to pattern General Gait Details: +2 Min A to ambulate short distance to chair Gait velocity: decreased Gait velocity interpretation: <1.31 ft/sec, indicative of household Actuary mobility:  (Pt spouse and other family member shown how to remove chair arm and visual/verbal demo for slide board (vs squat pivot) transfer to [facility] while pt in bathroom for toileting. Not able to assess WC transfers with patient today due to pt >10 mins in toilet rm)    Posture / Balance Dynamic Sitting Balance Sitting balance - Comments: pt up in recliner Balance Overall balance assessment: Needs assistance Sitting-balance support: No upper extremity supported, Feet supported Sitting balance-[name] Scale: Fair Sitting balance - Comments: pt up in recliner Standing balance support: Bilateral upper extremity supported, During functional activity, Reliant on assistive device for balance Standing balance-[name] Scale: Poor Standing balance comment: RW to steady, with assist for balance.    Special needs/care consideration Wound Vac yes and Skin surgical incision    Previous Home Environment (from acute therapy documentation) Living Arrangements: Spouse/significant other Available Help at Discharge: Family, Available 24 hours/day Type of Home: House Home Layout: Able to live on main level with bedroom/bathroom Home Access: Stairs to enter, Level entry, Other (comment) (has been living in the basement so has a level entry, has been there for x1 year) Entrance  Stairs-Number of Steps: 3 Bathroom Shower/Tub: Health visitor: Handicapped height Bathroom Accessibility: Yes How Accessible: Accessible via wheelchair, Accessible via walker Home Care Services: No Additional Comments: Pt lives with wife, daughter lives close by, and other family who could help 24/7  Discharge Living Setting Plans for Discharge Living Setting: House Type of Home at Discharge: House Discharge Home Layout: Able to live on main level with bedroom/bathroom, Multi-level Alternate Level Stairs-Rails: Left Alternate Level Stairs-Number of Steps: flight Discharge Home Access: Stairs to enter Entrance Stairs-Rails: Right Entrance Stairs-Number of Steps: 3 Discharge Bathroom Shower/Tub: Walk-in shower Discharge Bathroom Toilet: Standard Discharge Bathroom Accessibility: Yes How Accessible: Accessible via wheelchair, Accessible via walker  Social/Family/Support Systems Patient Roles: Spouse Contact Information: [phone] Anticipated Caregiver: [name] Anticipated Caregiver's Contact Information: Can provide 24/7 min-mod A Caregiver Availability: 24/7 Discharge Plan Discussed with Primary Caregiver: Yes Is Caregiver In Agreement with Plan?: Yes Does Caregiver/Family have Issues with Lodging/Transportation while Pt is in Rehab?: No  Goals Patient/Family Goal for Rehab: PT/OT min-mod A Expected length of stay: 16-18 days Pt/Family Agrees to Admission and willing to participate: Yes Program Orientation Provided & Reviewed with Pt/Caregiver Including Roles  & Responsibilities: Yes  Decrease burden of Care through IP rehab admission: not anticipated  Possible need for SNF placement upon discharge: not anticipated  Patient Condition: I have reviewed medical records from [facility] , spoken with CM, and patient and spouse. I met with patient at the bedside for inpatient rehabilitation assessment.  Patient will benefit from ongoing PT, OT,  and SLP, can actively participate in 3 hours of therapy a day 5 days of the week, and can make measurable gains during the admission.  Patient will also benefit from the coordinated team approach  during an Inpatient Acute Rehabilitation admission.  The patient will receive intensive therapy as well as Rehabilitation physician, nursing, social worker, and care management interventions.  Due to safety, skin/wound care, disease management, medication administration, pain management, and patient education the patient requires 24 hour a day rehabilitation nursing.  The patient is currently max A  with mobility and basic ADLs.  Discharge setting and therapy post discharge at home with home health is anticipated.  Patient has agreed to participate in the Acute Inpatient Rehabilitation Program and will admit today.  Preadmission Screen Completed By:  Leita KATHEE Kleine, 11/16/2023 9:25 AM ______________________________________________________________________   Discussed status with Dr. Emeline on 6/22/225 at 930 and received approval for admission today.  Admission Coordinator:  Leita KATHEE Kleine, CCC-SLP, time 930/Date 11/16/23   Assessment/Plan: Diagnosis: Right BKA Does the need for close, 24 hr/day Medical supervision in concert with the patient's rehab needs make it unreasonable for this patient to be served in a less intensive setting? Yes Co-Morbidities requiring supervision/potential complications: BKA with wound vac, presyncope, atrial fibrillation, chronic HFrEF, hypertension, COPD, hypokalemia, anemia, bowel and bladder incontinence. Due to bladder management, bowel management, safety, skin/wound care, disease management, medication administration, pain management, and patient education, does the patient require 24 hr/day rehab nursing? Yes Does the patient require coordinated care of a physician, rehab nurse, PT, OT to address physical and functional deficits in the context of the above medical diagnosis(es)?  Yes Addressing deficits in the following areas: balance, endurance, locomotion, strength, transferring, bowel/bladder control, bathing, dressing, feeding, grooming, and toileting Can the patient actively participate in an intensive therapy program of at least 3 hrs of therapy 5 days a week? Yes The potential for patient to make measurable gains while on inpatient rehab is good Anticipated functional outcomes upon discharge from inpatient rehab: min assist PT, min assist OT Estimated rehab length of stay to reach the above functional goals is: 16-18 days Anticipated discharge destination: Home 10. Overall Rehab/Functional Prognosis: good   MD Signature:  Joesph JAYSON Emeline, DO 11/16/2023

## 2023-11-16 NOTE — Progress Notes (Addendum)
 Inpatient Rehabilitation Admission Medication Review by a Pharmacist  A complete drug regimen review was completed for this patient to identify any potential clinically significant medication issues.  High Risk Drug Classes Is patient taking? Indication by Medication  Antipsychotic No   Anticoagulant Yes Apixaban  5 - Atrial fibrillation  Antibiotic No   Opioid Yes Oxycodone  PRN - pain  Antiplatelet No   Hypoglycemics/insulin No   Vasoactive Medication Yes Diltiazem  CD 180 daily - Atrial fibrillation Lasix  40 daily - Heart failure Tamsulosin  (BPH)  Chemotherapy No   Other Yes atorvastatin  20 - cholesterol B12 IM weekly - B12 deficiency Protonix  40 BID - GERD Potassium 20 mEq daily - supplementation Prednisone  20 mg daily - persistent asthma Pregabalin  100 BID - pain Iron  324 - supplementation/anemia Meclizine  PRN - dizziness Zofran  PRN - nausea     Type of Medication Issue Identified Description of Issue Recommendation(s)  Drug Interaction(s) (clinically significant)     Duplicate Therapy     Allergy      No Medication Administration End Date     Incorrect Dose     Additional Drug Therapy Needed  Home Allopurinol  100 on hold Resume when clinically appropriate to reduce risk of gout flare.  Significant med changes from prior encounter (inform family/care partners about these prior to discharge). Imdur  stopped at hospital discharge.  Home inhaler treatment supplemented with formulary alternative: Brovana , pulmicort , and PRN Duo-nebs. Resume antihypertensive therapy as appropriate. Counsel patient/caregiver of changes made at discharge.  Other       Clinically significant medication issues were identified that warrant physician communication and completion of prescribed/recommended actions by midnight of the next day:  No  Name of provider notified for urgent issues identified:   Provider Method of Notification: None  Pharmacist comments: Consider alternative therapy  to diltiazem  for atrial fibrillation in setting of heart failure. Recommend follow up with cardiology outpatient.  Time spent performing this drug regimen review (minutes):  20   Con GORMAN Laughter 11/16/2023 1:40 PM

## 2023-11-16 NOTE — Discharge Summary (Signed)
 PATIENT DETAILS Name: Levi [name] Age: 78 y.o. Sex: male Date of Birth: January 19, 1946 MRN: [id number]. Admitting Physician: Evalene GORMAN Sprinkles, MD ERE:[id number], Debby PARAS, MD  Admit Date: 11/08/2023 Discharge date: 11/16/2023  Recommendations for Outpatient Follow-up:  Follow up with PCP in 1-2 weeks Please obtain CMP/CBC in one week Please ensure follow up with Dr. Harden  Admitted From:  Home  Disposition: CIR   Discharge Condition: good  CODE STATUS:   Code Status: Full Code   Diet recommendation:  Diet Order             Diet - low sodium heart healthy           Diet Heart Room service appropriate? Yes; Fluid consistency: Thin  Diet effective now                    Brief Summary: Patient is a 78 y.o.  male with history of PAF on Eliquis , HFmrEF, HTN, HLD, COPD-presented with presyncope and sepsis secondary to right foot infection with acute osteomyelitis.   Significant events: 6/14>> admit to [facility] 6/20>> right BKA   Significant studies: 6/14>> MRI right foot: New high-grade ulceration within the lateral midfoot-with sinus tract extending superiorly to the distal lateral aspect of cuboid.  Osteomyelitis of plantar lateral cuboid and base of fifth metatarsal.   Significant microbiology data: 6/14>> blood culture: Negative   Procedures: 6/20>> right BKA   Consults: Orthopedics Infectious disease  Brief Hospital Course: Sepsis secondary to right foot infection with acute osteomyelitis Sepsis physiology resolved with IV antibiotics and right BKA on 6/20 Orthopedics recommending discharge with Prevena plus portable wound VAC pump-and to follow-up with Dr. Harden in 1 week after discharge. Antibiotics discontinued after BKA. Pain well-controlled with Lyrica  and as needed oral narcotics.   Presyncope Likely secondary to sepsis physiology Telemetry monitoring   PAF Resume Eliquis  Continue Cardizem  Telemetry monitoring   Chronic HFmrEF (EF 40-45% by  TEE on 10/28) Euvolemic Resume Lasix    HTN BP relatively stable Continue diltiazem  Imdur  remains on hold   COPD Not in exacerbation Continue bronchodilators Has been on chronic prednisone  for 14 years-resume home dose 20 mg daily.   Hypokalemia Replete/recheck   Normocytic anemia Mild Likely secondary to acute illness-no evidence of blood loss. Follow CBC.  Vitamin B12 deficiency SQ supplementation x 1 week-followed by oral supplementation thereafter-recheck vitamin B12 levels in 6 weeks.   BPH Flomax   Discharge Diagnoses:  Principal Problem:   Right foot infection Active Problems:   Sepsis [facility])   Discharge Instructions:  Activity:  As tolerated with Full fall precautions use walker/cane & assistance as needed   Discharge Instructions     (HEART FAILURE PATIENTS) Call MD:  Anytime you have any of the following symptoms: 1) 3 pound weight gain in 24 hours or 5 pounds in 1 week 2) shortness of breath, with or without a dry hacking cough 3) swelling in the hands, feet or stomach 4) if you have to sleep on extra pillows at night in order to breathe.   Complete by: As directed    Call MD for:  difficulty breathing, headache or visual disturbances   Complete by: As directed    Call MD for:  persistant nausea and vomiting   Complete by: As directed    Call MD for:  redness, tenderness, or signs of infection (pain, swelling, redness, odor or green/yellow discharge around incision site)   Complete by: As directed    Diet - low sodium  heart healthy   Complete by: As directed    Discharge wound care:   Complete by: As directed    Negative Pressure Wound Therapy - Incisional      Comments: Attach the wound VAC dressing to the Prevena plus portable wound VAC pump at discharge.  Show patient how to plug this and to keep it charged.    Negative Pressure Wound Therapy - Incisional  Until discontinued           Wound care  Every other day    Comments: Cleanse L arm/wrist  skin tear with NS, apply Xeroform gauze Soila [phone]) to wound bed every other day and secure with silicone foam or Kerlix roll gauze whichever is preferred.  SOAK DRESSING WITH NS IF ADHERED TO WOUND BED FOR ATRAUMATIC REMOVAL.   Increase activity slowly   Complete by: As directed    Negative Pressure Wound Therapy - Incisional   Complete by: As directed    Attach the wound VAC dressing to the Prevena plus portable wound VAC pump at discharge.  Show patient how to plug this and to keep it charged.      Allergies as of 11/16/2023       Reactions   Levaquin  [levofloxacin ] Itching   Tolerated ciprofloxacin  in 2022.        Medication List     STOP taking these medications    ciprofloxacin  500 MG tablet Commonly known as: CIPRO    isosorbide  mononitrate 30 MG 24 hr tablet Commonly known as: IMDUR    oxyCODONE -acetaminophen  5-325 MG tablet Commonly known as: Percocet       TAKE these medications    albuterol  108 (90 Base) MCG/ACT inhaler Commonly known as: VENTOLIN  HFA INHALE 2 PUFFS BY MOUTH EVERY 4 HOURS AS NEEDED FOR WHEEZE OR FOR SHORTNESS OF BREATH What changed: See the new instructions.   allopurinol  100 MG tablet Commonly known as: ZYLOPRIM  TAKE 1 TABLET (100 MG TOTAL) BY MOUTH IN THE MORNING   atorvastatin  20 MG tablet Commonly known as: LIPITOR TAKE 1 TABLET BY MOUTH EVERY DAY   cyanocobalamin  1000 MCG/ML injection Commonly known as: VITAMIN B12 Inject 1 mL (1,000 mcg total) into the skin once a week for 7 days. Start taking on: November 17, 2023   cyclobenzaprine  10 MG tablet Commonly known as: FLEXERIL  Take 1 tablet (10 mg total) by mouth 3 (three) times daily as needed for muscle spasms.   diclofenac  Sodium 1 % Gel Commonly known as: VOLTAREN  Apply 4 g topically as needed (joint pain).   diltiazem  180 MG 24 hr capsule Commonly known as: Cardizem  CD Take 1 capsule (180 mg total) by mouth daily.   diltiazem  30 MG tablet Commonly known as:  CARDIZEM  TAKE 1 TABLET (30 MG TOTAL) BY MOUTH AS NEEDED (HEART RATE OVER 120).   Eliquis  5 MG Tabs tablet Generic drug: apixaban  TAKE 1 TABLET BY MOUTH TWICE A DAY   feeding supplement Liqd Take 237 mLs by mouth 2 (two) times daily between meals.   ferrous gluconate  324 MG tablet Commonly known as: FERGON Take 1 tablet (324 mg total) by mouth daily.   furosemide  40 MG tablet Commonly known as: LASIX  Take 1 tablet (40 mg total) by mouth daily. What changed:  how much to take when to take this reasons to take this   ipratropium-albuterol  0.5-2.5 (3) MG/3ML Soln Commonly known as: DUONEB 1 NEBULE EVERY 6 HOURS AS NEEDED. **J45.3** What changed:  how much to take how to take this when  to take this reasons to take this additional instructions   meclizine  25 MG tablet Commonly known as: ANTIVERT  TAKE 1 TABLET BY MOUTH 3 TIMES DAILY AS NEEDED FOR DIZZINESS.   ondansetron  8 MG disintegrating tablet Commonly known as: ZOFRAN -ODT Take 1 tablet (8 mg total) by mouth every 8 (eight) hours as needed for nausea.   oxyCODONE  5 MG immediate release tablet Commonly known as: Oxy IR/ROXICODONE  Take 1-2 tablets (5-10 mg total) by mouth every 6 (six) hours as needed for moderate pain (pain score 4-6).   pantoprazole  40 MG tablet Commonly known as: PROTONIX  TAKE 1 TABLET (40 MG TOTAL) BY MOUTH TWICE A DAY BEFORE MEALS   polyethylene glycol 17 g packet Commonly known as: MIRALAX  / GLYCOLAX  Take 17 g by mouth daily. Start taking on: November 17, 2023   potassium chloride  SA 20 MEQ tablet Commonly known as: KLOR-CON  M Take 1 tablet (20 mEq total) by mouth daily. Start taking on: November 17, 2023   predniSONE  10 MG tablet Commonly known as: DELTASONE  Take 20 mg by mouth daily with breakfast.   pregabalin  100 MG capsule Commonly known as: Lyrica  Take 1 capsule (100 mg total) by mouth 2 (two) times daily.   Striverdi Respimat 2.5 MCG/ACT Aers Generic drug: Olodaterol HCl INHALE 2  PUFFS BY MOUTH INTO THE LUNGS DAILY What changed: See the new instructions.   tamsulosin  0.4 MG Caps capsule Commonly known as: FLOMAX  Take 0.4 mg by mouth at bedtime.               Discharge Care Instructions  (From admission, onward)           Start     Ordered   11/16/23 0000  Discharge wound care:       Comments: Negative Pressure Wound Therapy - Incisional      Comments: Attach the wound VAC dressing to the Prevena plus portable wound VAC pump at discharge.  Show patient how to plug this and to keep it charged.    Negative Pressure Wound Therapy - Incisional  Until discontinued           Wound care  Every other day    Comments: Cleanse L arm/wrist skin tear with NS, apply Xeroform gauze Soila [phone]) to wound bed every other day and secure with silicone foam or Kerlix roll gauze whichever is preferred.  SOAK DRESSING WITH NS IF ADHERED TO WOUND BED FOR ATRAUMATIC REMOVAL.   11/16/23 9092            Follow-up Information     Harden Jerona GAILS, MD Follow up in 1 week(s).   Specialty: Orthopedic Surgery Contact information: [address] [place]  [place] [address] [phone]         Curtis Debby PARAS, MD. Schedule an appointment as soon as possible for a visit in 1 week(s).   Specialties: Family Medicine, Sports Medicine, Radiology Contact information: [phone] [address] [place] [address] [phone]                Allergies  Allergen Reactions   Levaquin  [Levofloxacin ] Itching    Tolerated ciprofloxacin  in 2022.     Other Procedures/Studies: CT TIBIA FIBULA RIGHT W CONTRAST Result Date: 11/11/2023 CLINICAL DATA:  Right lower leg cellulitis. EXAM: CT OF THE LOWER RIGHT EXTREMITY WITH CONTRAST TECHNIQUE: Multidetector CT imaging of the right lower leg was performed according to the standard protocol following intravenous contrast administration. RADIATION DOSE REDUCTION: This exam was performed according to the departmental  dose-optimization program which includes automated exposure control, adjustment of the mA and/or kV according to patient size and/or use of iterative reconstruction technique. CONTRAST:  75mL OMNIPAQUE  IOHEXOL  350 MG/ML SOLN COMPARISON:  None Available. FINDINGS: Bones/Joint/Cartilage Status post right total knee arthroplasty. No evidence of hardware loosening, acute fracture or dislocation. No evidence of osteomyelitis. Small knee joint effusion with mild synovial irregularity and small intra-articular calcifications. No significant findings at the ankle. There are mild midfoot degenerative changes. Ligaments Suboptimally assessed by CT. Muscles and Tendons The visualized quadriceps and patellar tendons appear intact. The ankle tendons appear intact, although their distal insertions are incompletely visualized. There is some degenerative calcification within the tibialis anterior tendon. No focal muscular atrophy or abnormal enhancement identified. Soft tissues Generalized subcutaneous edema throughout the lower leg, greatest distally. No focal fluid collection, unexpected foreign body, soft tissue emphysema or suspicious enhancement identified. There are extensive vascular calcifications without evidence of acute vascular thrombosis. IMPRESSION: 1. Generalized subcutaneous edema throughout the lower leg, greatest distally, consistent with cellulitis. No focal fluid collection, soft tissue emphysema or suspicious enhancement identified. 2. No acute osseous findings. Status post right total knee arthroplasty without evidence of hardware loosening or osteomyelitis. 3. Small knee joint effusion with mild synovial irregularity and small intra-articular calcifications. Electronically Signed   By: Elsie Perone M.D.   On: 11/11/2023 08:22   MR FOOT RIGHT W WO CONTRAST Result Date: 11/08/2023 CLINICAL DATA:  Right foot infection. Clinical concern for acute osteomyelitis. History of Charcot arthropathy and  osteomyelitis. EXAM: MRI OF THE RIGHT FOREFOOT WITHOUT AND WITH CONTRAST TECHNIQUE: Multiplanar, multisequence MR imaging of the right foot was performed before and after the administration of intravenous contrast. CONTRAST:  10mL GADAVIST  GADOBUTROL  1 MMOL/ML IV SOLN COMPARISON:  right foot radiographs 11/08/2023 and 10/22/2023, CT right foot 10/08/2023 FINDINGS: Soft tissue/bones/Joint/Cartilage There is new high-grade ulceration within the lateral midfoot, plantar to the cuboid in a region measuring up to approximately 10 mm in transverse dimension and 10 mm in the long axis of the foot (axial series 6, image 33 and sagittal series 9, image 23). There is a sinus tract measuring up to 9 mm in long axis of the foot and 16 mm in transverse dimension (post-contrast axial series 11, images 32 and 33, postcontrast sagittal series 12, image 23; axial series 6, image 33 and coronal series 7 images 7 through 9). This extends superiorly to the distal lateral aspect of the cuboid. Additional scattered low signal with blooming artifact that may represent subcutaneous air and a tiny wound measuring up to approximately 6 mm just lateral to the base of the fifth metatarsal (axial series 4, image 32 and coronal series 7, image 12) suspicious for a sinus tract extending to the base of the fifth metatarsal. There is high-grade marrow edema throughout the plantar and lateral aspects of the cuboid and throughout the entire adjacent proximal 2.9 cm of the fifth metatarsal. Mild to moderate plantar cortical erosion (axial series 4, image 33) of the cuboid and base of the fifth metatarsal. Findings are indicative of osteomyelitis of the lateral plantar cuboid and the base of the fifth metatarsal. Thin linear fluid bright signal at the lateral aspect of the fifth metatarsal proximal metaphysis may represent an acute nondisplaced fracture (coronal series 7, image 12), possibly pathologic from weakened bone underlying osteomyelitis. There  is again postsurgical change of amputation of the fifth toe phalanges. There is metallic susceptibility artifact in between the fifth and fourth metatarsal heads, corresponding to a  focal density on prior CT and radiographs that may represent postsurgical metal. Postsurgical changes again noted of prior resection of the second through fourth proximal phalangeal heads, better seen on prior CT. There are again advanced Charcot arthropathy changes of the midfoot with second through fifth metatarsal mild homolateral subluxation. There is again plantar migration of the midfoot, especially the cuboid, with rocker bottom deformity. Marrow edema within the distal aspect of the lateral cuneiform with high-grade marrow edema within the proximal 15 mm of the fourth metatarsal (coronal series 7 images 13 through 15) are suspicious for osteomyelitis on both sides of the fourth tarsometatarsal joint (also coronal series 7, image 17 and sagittal series 9, image 18). Moderate marrow edema at the first and second tarsometatarsal joints is nonspecific and may represent Charcot arthropathy degenerative changes although acute osteomyelitis is difficult to entirely exclude. Ligaments Limited evaluation given large field-of-view and decreased resolution. There appears to be disruption of the Lisfranc ligament complex, consistent with the lateral subluxation of the second through fifth metatarsals. Muscles and Tendons Moderate focal fluid within the flexor hallucis longus tendon sheath within the plantar midfoot just proximal to the intersection with the flexor digitorum longus tendon at the knot of Henry (axial series 6, image 28). Moderate edema throughout the plantar midfoot to forefoot intrinsic musculature. Soft tissues Moderate diffuse subcutaneous fat edema and swelling about the ankle and midfoot. IMPRESSION: 1. New high-grade ulceration within the lateral midfoot, plantar to the cuboid, with a sinus tract extending superiorly to  the distal lateral aspect of the cuboid. Additional tiny wound just lateral to the base of the fifth metatarsal with a sinus tract extending to the base of the fifth metatarsal. 2. Osteomyelitis of the plantar lateral cuboid and base of the fifth metatarsal. Additional marrow edema within the distal aspect of the lateral cuneiform and proximal 15 mm of the fourth metatarsal are suspicious for osteomyelitis on both sides of the fourth tarsometatarsal joint. 3. Thin linear fluid bright signal at the lateral aspect of the fifth metatarsal proximal metaphysis may represent an acute nondisplaced fracture, possibly pathologic from the weakened bone from the underlying osteomyelitis. 4. Advanced Charcot arthropathy changes of the midfoot with second through fifth metatarsal homolateral subluxation and plantar migration of the midfoot, especially the cuboid, with rocker-bottom deformity. 5. Moderate marrow edema at the first and second tarsometatarsal joints is nonspecific and may represent Charcot arthropathy degenerative changes, although acute osteomyelitis is difficult to entirely exclude. Electronically Signed   By: Tanda Lyons M.D.   On: 11/08/2023 17:04   DG Foot Complete Right Result Date: 11/08/2023 CLINICAL DATA:  A right foot infection. EXAM: RIGHT FOOT COMPLETE - 3+ VIEW COMPARISON:  Right foot series 10/22/2023 FINDINGS: Three views. There is diffuse moderate edema. Skin staples previously seen overlying the lateral midfoot have been removed in the interval and resolution of patchy soft tissue gas in the area is noted. There are heavy vascular calcifications. Osteopenia without evidence of fractures with old fifth toe amputation. Remote resection of the second through fourth proximal phalangeal heads. There is advanced Charcot change especially at the Lisfranc and tarsometatarsal joints, lateral subluxations as seen previously, pes planus and midfoot sag with chronic rocker bottom deformity. There is a  small plantar calcaneal spur. No destructive bone lesions are seen. IMPRESSION: 1. Diffuse moderate edema. 2. Interval removal of skin staples overlying the lateral midfoot with resolution of patchy soft tissue gas in the area. 3. Advanced Charcot change with lateral metatarsal subluxations, pes planus and  midfoot sag with chronic rocker bottom deformity. 4. Osteopenia. No evidence of fractures or focal destructive lesions. 5. Heavy vascular calcifications. 6. Old fifth toe amputation and remote resection of the second through fourth proximal phalangeal heads. Electronically Signed   By: Francis Quam M.D.   On: 11/08/2023 06:36   CT CERVICAL SPINE WO CONTRAST Result Date: 11/08/2023 EXAM: CT CERVICAL SPINE WITHOUT CONTRAST 11/08/2023 05:17:53 AM TECHNIQUE: CT of the cervical was performed without the administration of intravenous contrast. Multiplanar reformatted images are provided for review. Automated exposure control, iterative reconstruction, and/or weight based adjustment of the mA/kV was utilized to reduce the radiation dose to as low as reasonably achievable. COMPARISON: None available. CLINICAL HISTORY: Polytrauma, blunt. Chief complaints; Level 2 FOT; CT HEAD WO CONTRAST; Head trauma, moderate-severe; CT CERVICAL SPINE WO CONTRAST; Polytrauma, blunt FINDINGS: CERVICAL SPINE: BONES AND ALIGNMENT: Solid fusion of the cervical spine is present at C4-5, C5-6, and C6-7. Hardware is intact. Straightening of the normal cervical lordosis is present. DEGENERATIVE CHANGES: No significant degenerative changes. SOFT TISSUES: No prevertebral soft tissue swelling. Mild atherosclerotic changes are present at the carotid bifurcations bilaterally without definite stenosis. IMPRESSION: 1. No acute abnormality of the cervical spine related to the polytrauma. 2. Solid fusion of the cervical spine at C4-5, C5-6, and C6-7 with intact hardware. Electronically signed by: Lonni Necessary MD 11/08/2023 05:29 AM EDT RP  Workstation: [id number]   DG Chest Port 1 View Result Date: 11/08/2023 CLINICAL DATA:  Fall. EXAM: PORTABLE CHEST 1 VIEW COMPARISON:  None Available. FINDINGS: Low volume film. The cardio pericardial silhouette is enlarged. The lungs are clear without focal pneumonia, edema, pneumothorax or pleural effusion. Multiple nonacute rib fractures evident bilaterally. Status post left shoulder replacement. Telemetry leads overlie the chest. IMPRESSION: Low volume film without acute cardiopulmonary findings. Electronically Signed   By: Camellia Candle M.D.   On: 11/08/2023 05:27   CT HEAD WO CONTRAST Result Date: 11/08/2023 EXAM: CT HEAD WITHOUT CONTRAST 11/08/2023 05:17:53 AM TECHNIQUE: CT of the head was performed without the administration of intravenous contrast. Automated exposure control, iterative reconstruction, and/or weight based adjustment of the mA/kV was utilized to reduce the radiation dose to as low as reasonably achievable. COMPARISON: CT head without contrast from 09/13/2020. MR head without and with contrast from 05/21/2022. CLINICAL HISTORY: Head trauma, moderate-severe. Chief complaints; Level 2 FOT; Head trauma, moderate-severe; Polytrauma, blunt. FINDINGS: BRAIN AND VENTRICLES: There is no acute intracranial hemorrhage, mass effect or midline shift. No abnormal extra-axial fluid collection. The gray-white differentiation is maintained without an acute infarct. There is no hydrocephalus. Periventricular white matter hypoattenuation, mildly advanced for age, is stable. ORBITS: Bilateral lens replacements are noted. The globes and orbits are otherwise within normal limits. SINUSES: The visualized paranasal sinuses and mastoid air cells demonstrate no acute abnormality. SOFT TISSUES AND SKULL: No acute abnormality of the visualized skull or soft tissues. Atherosclerotic calcifications are present in the cavernous carotid arteries bilaterally. No hyperdense vessel is present. IMPRESSION: 1. No acute  intracranial abnormality related to the head trauma. 2. Stable periventricular white matter hypoattenuation, mildly advanced for age. Electronically signed by: Lonni Necessary MD 11/08/2023 05:25 AM EDT RP Workstation: [id number]   DG Foot Complete Right Result Date: 10/22/2023 Please see detailed radiograph report in office note.    TODAY-DAY OF DISCHARGE:  Subjective:   Jervis Trapani today has no headache,no chest abdominal pain,no new weakness tingling or numbness, feels much better wants to go home today.   Objective:   Blood pressure (!) 134/90,  pulse 92, temperature 97.8 F (36.6 C), temperature source Oral, resp. rate 16, height (P) 6' 2 (1.88 m), weight (P) 93 kg, SpO2 97%.  Intake/Output Summary (Last 24 hours) at 11/16/2023 0908 Last data filed at 11/16/2023 0625 Gross per 24 hour  Intake --  Output 1850 ml  Net -1850 ml   Filed Weights   11/08/23 0518 11/14/23 1232  Weight: 93 kg (P) 93 kg    Exam: Awake Alert, Oriented *3, No new F.N deficits, Normal affect [place].AT,PERRAL Supple Neck,No JVD, No cervical lymphadenopathy appriciated.  Symmetrical Chest wall movement, Good air movement bilaterally, CTAB RRR,No Gallops,Rubs or new Murmurs, No Parasternal Heave +ve B.Sounds, Abd Soft, Non tender, No organomegaly appriciated, No rebound -guarding or rigidity. No Cyanosis, Clubbing or edema, No new Rash or bruise   PERTINENT RADIOLOGIC STUDIES: No results found.   PERTINENT LAB RESULTS: CBC: Recent Labs    11/15/23 0744 11/16/23 0656  WBC 11.4* 8.0  HGB 9.0* 9.2*  HCT 27.6* 27.9*  PLT 191 219   CMET CMP     Component Value Date/Time   NA 138 11/16/2023 0656   NA 141 11/03/2023 1058   K 3.0 (L) 11/16/2023 0656   CL 107 11/16/2023 0656   CO2 23 11/16/2023 0656   GLUCOSE 79 11/16/2023 0656   BUN 13 11/16/2023 0656   BUN 17 11/03/2023 1058   CREATININE 0.48 (L) 11/16/2023 0656   CREATININE 0.85 05/27/2023 1435   CALCIUM  8.1 (L) 11/16/2023 0656    PROT 5.6 (L) 11/08/2023 0514   PROT 5.3 (L) 11/03/2023 1058   ALBUMIN 2.9 (L) 11/08/2023 0514   ALBUMIN 3.6 (L) 11/03/2023 1058   AST 19 11/08/2023 0514   AST 19 02/28/2020 1440   ALT 13 11/08/2023 0514   ALT 15 02/28/2020 1440   ALKPHOS 66 11/08/2023 0514   BILITOT 1.1 11/08/2023 0514   BILITOT 0.4 11/03/2023 1058   BILITOT 0.6 02/28/2020 1440   EGFR 91 11/03/2023 1058   GFRNONAA >60 11/16/2023 0656   GFRNONAA >60 02/28/2020 1440    GFR Estimated Creatinine Clearance: 91.3 mL/min (A) (by C-G formula based on SCr of 0.48 mg/dL (L)). No results for input(s): LIPASE, AMYLASE in the last 72 hours. No results for input(s): CKTOTAL, CKMB, CKMBINDEX, TROPONINI in the last 72 hours. Invalid input(s): POCBNP No results for input(s): DDIMER in the last 72 hours. No results for input(s): HGBA1C in the last 72 hours. No results for input(s): CHOL, HDL, LDLCALC, TRIG, CHOLHDL, LDLDIRECT in the last 72 hours. No results for input(s): TSH, T4TOTAL, T3FREE, THYROIDAB in the last 72 hours.  Invalid input(s): FREET3 No results for input(s): VITAMINB12, FOLATE, FERRITIN, TIBC, IRON , RETICCTPCT in the last 72 hours. Coags: No results for input(s): INR in the last 72 hours.  Invalid input(s): PT Microbiology: Recent Results (from the past 240 hours)  Culture, blood (Routine x 2)     Status: None   Collection Time: 11/08/23  5:14 AM   Specimen: BLOOD RIGHT FOREARM  Result Value Ref Range Status   Specimen Description BLOOD RIGHT FOREARM  Final   Special Requests   Final    BOTTLES DRAWN AEROBIC AND ANAEROBIC Blood Culture adequate volume   Culture   Final    NO GROWTH 5 DAYS Performed at [facility], [address], [place], [place] [address]    Report Status 11/13/2023 FINAL  Final  Culture, blood (Routine x 2)     Status: None   Collection Time: 11/08/23  5:53 AM   Specimen: BLOOD  Result Value Ref Range Status   Specimen  Description BLOOD BLOOD RIGHT ARM  Final   Special Requests   Final    BOTTLES DRAWN AEROBIC AND ANAEROBIC Blood Culture adequate volume   Culture   Final    NO GROWTH 5 DAYS Performed at [facility], [address], [place], [place] [address]    Report Status 11/13/2023 FINAL  Final    FURTHER DISCHARGE INSTRUCTIONS:  Get Medicines reviewed and adjusted: Please take all your medications with you for your next visit with your Primary MD  Laboratory/radiological data: Please request your Primary MD to go over all hospital tests and procedure/radiological results at the follow up, please ask your Primary MD to get all Hospital records sent to his/her office.  In some cases, they will be blood work, cultures and biopsy results pending at the time of your discharge. Please request that your primary care M.D. goes through all the records of your hospital data and follows up on these results.  Also Note the following: If you experience worsening of your admission symptoms, develop shortness of breath, life threatening emergency, suicidal or homicidal thoughts you must seek medical attention immediately by calling 911 or calling your MD immediately  if symptoms less severe.  You must read complete instructions/literature along with all the possible adverse reactions/side effects for all the Medicines you take and that have been prescribed to you. Take any new Medicines after you have completely understood and accpet all the possible adverse reactions/side effects.   Do not drive when taking Pain medications or sleeping medications (Benzodaizepines)  Do not take more than prescribed Pain, Sleep and Anxiety Medications. It is not advisable to combine anxiety,sleep and pain medications without talking with your primary care practitioner  Special Instructions: If you have smoked or chewed Tobacco  in the last 2 yrs please stop smoking, stop any regular Alcohol  and or any Recreational drug  use.  Wear Seat belts while driving.  Please note: You were cared for by a hospitalist during your hospital stay. Once you are discharged, your primary care physician will handle any further medical issues. Please note that NO REFILLS for any discharge medications will be authorized once you are discharged, as it is imperative that you return to your primary care physician (or establish a relationship with a primary care physician if you do not have one) for your post hospital discharge needs so that they can reassess your need for medications and monitor your lab values.  Total Time spent coordinating discharge including counseling, education and face to face time equals greater than 30 minutes.  SignedBETHA Donalda Applebaum 11/16/2023 9:08 AM

## 2023-11-16 NOTE — H&P (Incomplete)
 Physical Medicine and Rehabilitation Admission H&P    No chief complaint on file. : HPI: Levi [name] is a 78 y.o. male with a PMHx of PAF on eliquis , HFpEF (EF 40-45% in 02/2023), HTN, HLD, COPD/agent orange exposure, BPH, GERD, anemia, and s/p recent LLE 2nd digit/RLE 5th digit ray amputation on 08/13/2023 and R foot exostectomy 10/16/23, who presented to the ED on 11/08/23 with presyncope/fall 2/2 sepsis 2/2 RLE cellulitis/osteomyelitis. In the ED, tachycardic and tachypneic, labs showing K 2.9, Cr 1.05, albumin 2.9, WBC 13.8, and lactate 2.4, CRP 16.5, procal 1.92, ESR 30. CTH NAICA. CT C-spine no acute abnl. CXR no acute cardiopulmonary findings. XR R foot showed moderate edema, advanced Charcot foot, and old fifth toe amputation and remote resection of the second through fourth proximal phalangeal heads. Pt received IV vancomycin , IV cefepime , and IV metronidazole  in the ED and was admitted. Vanc/cefepime /flagyl  were continued during admission, MRI R foot on 11/08/23 confirmed osteomyelitis and charcot degeneration.  BCx NGTD x2. He underwent R BKA with Dr. Harden on 11/14/23 and abx were discontinued 24hrs after amputation. He requires comprehensive rehab program due to functional decline after his BKA.    ROS Past Medical History:  Diagnosis Date   Agent orange exposure 1970's   takes Imdur ,Diltiazem , and Atacand  daily   Agent orange exposure    ALLERGIC RHINITIS    Allergy     seasonal allergies   Anemia    iron  deficiency   Arthritis    Asthma    uses inhaler and nebulizers   Atrial fibrillation (HCC)    Bruises easily    d/t prednisone  daily   Cataract    Cellulitis 03/22/2020   Chest wall pain 09/21/2019   L scapula, reported 4/27 21/     CHF (congestive heart failure) (HCC)    dx-on meds to treat   Chronic bronchitis (HCC)    used to get it q yr; last time ?2008 (04/28/2012)   Clotting disorder (HCC) 2019   On blood thinner when I bleed it is for a consideral amount  of time. This is tegardless of size of wound.   COPD (chronic obstructive pulmonary disease) (HCC)    agent orange exposure   Degenerative disk disease    everywhere (04/28/2012)   Diverticulitis    Dysrhythmia    afib   Elevated uric acid in blood    takes Allopurinol  daily   Emphysema of lung (HCC)    Enlarged prostate    but not on any meds   GERD (gastroesophageal reflux disease)    takes Protonix  daily   H/O hiatal hernia    Headache    History of colonoscopy    History of pneumonia    a. 2010   Hyperlipidemia    takes Lipitor daily; pt. states he takes a preventive   Hypothyroidism    Joint pain    Joint swelling    Leukocytosis    MSSA bacteremia 03/19/2023   Nausea and vomiting 11/12/2022   Neuromuscular disorder (HCC)    bilat legs, and bilat arms   Neuropathy    Osteoporosis 2012   PAF (paroxysmal atrial fibrillation) (HCC)    a. Dx 04/2012, CHA2DS2VASc = 1 (age);  b. 04/2012 Echo: EF 40-50%, mild MR.   Personal history of colonic adenomas 02/09/2013   Pneumonia    Sepsis (HCC) 03/18/2023   Shortness of breath dyspnea    Thyroid  disease    no taking meds-caused dizziness   Past Surgical  History:  Procedure Laterality Date   38 HOUR PH STUDY N/A 05/30/2021   Procedure: 24 HOUR PH STUDY;  Surgeon: San Sandor GAILS, DO;  Location: [facility];  Service: Gastroenterology;  Laterality: N/A;   AMPUTATION TOE Bilateral 08/13/2023   Procedure: LEFT SECOND TOE AMPUTATION AND RIGHT FIFTH TOE AMPUTATION;  Surgeon: Malvin Marsa FALCON, DPM;  Location: [facility] OR;  Service: Orthopedics/Podiatry;  Laterality: Bilateral;   ANTERIOR CERVICAL DECOMP/DISCECTOMY FUSION N/A 07/21/2012   Procedure: ANTERIOR CERVICAL DECOMPRESSION/DISCECTOMY FUSION 2 LEVELS;  Surgeon: Victory Gens, MD;  Location: [facility];  Service: Neurosurgery;  Laterality: N/A;  C4-5 C5-6 Anterior cervical decompression/diskectomy/fusion   ANTERIOR LAT LUMBAR FUSION N/A 04/07/2017   Procedure: Thoracic  twelve-Lumbar one Anterolateral decompression/fusion;  Surgeon: Gens Victory, MD;  Location: [facility];  Service: Neurosurgery;  Laterality: N/A;   ARTHRODESIS FOOT WITH WEIL OSTEOTOMY Left 11/07/2022   Procedure: LEFT THIRD METATARSOPHALANGEAL CAPSULOTOMY, WEIL OSTEOTOMY;  Surgeon: Elsa Lonni SAUNDERS, MD;  Location[facility] OR;  Service: Orthopedics;  Laterality: Left;  LENGTH OF SURGERY: 60 MINUTES   BACK SURGERY     x 3   BONE EXOSTOSIS EXCISION Right 10/16/2023   Procedure: EXCISION, EXOSTOSIS;  Surgeon: Janit Thresa HERO, DPM;  Location: [facility];  Service: Orthopedics/Podiatry;  Laterality: Right;   CARDIAC CATHETERIZATION  11/2008   Dr. Jeffrie - 20% calcified non flow limiting left main, 50% EF apical hypokinesis   CARPAL TUNNEL RELEASE Bilateral    CERVICAL DISC ARTHROPLASTY N/A 04/07/2017   Procedure: Cervical six-seven Disc arthroplasty;  Surgeon: Gens Victory, MD;  Location: [facility] OR;  Service: Neurosurgery;  Laterality: N/A;   CHEST TUBE INSERTION Left 04/11/2017   Procedure: CHEST TUBE INSERTION;  Surgeon: Fleeta Hanford Coy, MD;  Location: [facility] OR;  Service: Thoracic;  Laterality: Left;   CHOLECYSTECTOMY     COLONOSCOPY  2016   CG-MAC-miralax (good)servere TICS/TA x 2   ESOPHAGEAL MANOMETRY N/A 05/30/2021   Procedure: ESOPHAGEAL MANOMETRY (EM);  Surgeon: San Sandor GAILS, DO;  [facility]: [facility];  Service: Gastroenterology;  Laterality: N/A;  ph impedence   ESOPHAGOGASTRODUODENOSCOPY     EYE SURGERY Bilateral 2011   steroidal encapsulation (04/28/2012)   FOOT SURGERY Right    x 2   JOINT REPLACEMENT     Many surgeries including 5 joit replacements   KNEE ARTHROSCOPY Bilateral    LATERAL / POSTERIOR COMBINED FUSION LUMBAR SPINE  2012   LEFT ATRIAL APPENDAGE OCCLUSION N/A 02/22/2021   Procedure: LEFT ATRIAL APPENDAGE OCCLUSION;  Surgeon: Cindie Ole DASEN, MD;  [facility];  Service: Cardiovascular;  Laterality: N/A;   NEUROPLASTY / TRANSPOSITION ULNAR NERVE AT ELBOW  Bilateral    POLYPECTOMY  2016   severe TICS/TA x 2   POSTERIOR CERVICAL LAMINECTOMY WITH MET- RX Right 06/11/2021   Procedure: Right Cervical six-seven  Laminectomy and foraminotomy with metrex;  Surgeon: Gens Victory, MD;  [facility];  Service: Neurosurgery;  Laterality: Right;   REFRACTIVE SURGERY Left 07/07/2023   laser surgery but still has floaters   REVERSE SHOULDER ARTHROPLASTY  04/28/2012   Procedure: REVERSE SHOULDER ARTHROPLASTY;  Surgeon: Eva Elsie Herring, MD;  Location: [facility] OR;  Service: Orthopedics;  Laterality: Left;  Left shouder reverse total shoulder arthroplasty   SHOULDER SURGERY     SPINE SURGERY     I have had 7 spinal surgeries   TEE WITHOUT CARDIOVERSION N/A 02/22/2021   Procedure: TRANSESOPHAGEAL ECHOCARDIOGRAM (TEE);  Surgeon: Cindie Ole DASEN, MD;  Location: [facility] INVASIVE CV LAB;  Service:  Cardiovascular;  Laterality: N/A;   TENDON REPAIR Right 08/07/2020   Procedure: RIGHT HAND LIGAMENT RECONSTRUCTION AND TENDON INTERPOSITION;  Surgeon: Yvone Rush, MD;  Location: [facility];  Service: Orthopedics;  Laterality: Right;   TONSILLECTOMY AND ADENOIDECTOMY  1954   TOTAL HIP ARTHROPLASTY Left 2011   left (04/28/2012)   TOTAL HIP ARTHROPLASTY Right 07/31/2015   Procedure: TOTAL HIP ARTHROPLASTY ANTERIOR APPROACH;  Surgeon: Rush Yvone, MD;  [facility];  Service: Orthopedics;  Laterality: Right;   TOTAL KNEE ARTHROPLASTY  01/10/2012   Procedure: TOTAL KNEE ARTHROPLASTY;  Surgeon: Rush LITTIE Yvone, MD;  [facility]: [facility];  Service: Orthopedics;;  left total knee arthroplasty   TOTAL KNEE ARTHROPLASTY Right 06/17/2014   Procedure: TOTAL KNEE ARTHROPLASTY;  Surgeon: Rush LITTIE Yvone, MD;  [facility];  Service: Orthopedics;  Laterality: Right;   toupet fundolplication  11/01/2021   TRANSESOPHAGEAL ECHOCARDIOGRAM (CATH LAB) N/A 03/24/2023   Procedure: TRANSESOPHAGEAL ECHOCARDIOGRAM;  Surgeon: Mona Vinie BROCKS, MD;  [facility] INV[facility] CV  LAB;  Service: Cardiovascular;  Laterality: N/A;   Family History  Problem Relation Age of Onset   Diabetes Mother    Heart disease Father        Died 8, MI   Cancer Father    Hyperlipidemia Father    Stroke Father    Prostate cancer Brother    Colon polyps Brother    Colon polyps Brother    Asthma Brother    COPD Brother    Colon cancer Brother        Dx age 51   Cancer Brother    Colon cancer Other    Esophageal cancer Neg Hx    Rectal cancer Neg Hx    Stomach cancer Neg Hx    Pancreatic cancer Neg Hx    Kidney disease Neg Hx    Liver disease Neg Hx    Neuropathy Neg Hx    Social History:  reports that he has never smoked. He has never used smokeless tobacco. He reports that he does not drink alcohol and does not use drugs. Allergies:  Allergies  Allergen Reactions   Levaquin  [Levofloxacin ] Itching    Tolerated ciprofloxacin  in 2022.   Medications Prior to Admission  Medication Sig Dispense Refill   albuterol  (VENTOLIN  HFA) 108 (90 Base) MCG/ACT inhaler INHALE 2 PUFFS BY MOUTH EVERY 4 HOURS AS NEEDED FOR WHEEZE OR FOR SHORTNESS OF BREATH (Patient taking differently: Inhale 1-2 puffs into the lungs every 4 (four) hours as needed.) 8.5 each 12   allopurinol  (ZYLOPRIM ) 100 MG tablet TAKE 1 TABLET (100 MG TOTAL) BY MOUTH IN THE MORNING 90 tablet 3   atorvastatin  (LIPITOR) 20 MG tablet TAKE 1 TABLET BY MOUTH EVERY DAY 90 tablet 0   ciprofloxacin  (CIPRO ) 500 MG tablet Take 500 mg by mouth 2 (two) times daily.     cyclobenzaprine  (FLEXERIL ) 10 MG tablet Take 1 tablet (10 mg total) by mouth 3 (three) times daily as needed for muscle spasms. 90 tablet 3   diclofenac  Sodium (VOLTAREN ) 1 % GEL Apply 4 g topically as needed (joint pain).     diltiazem  (CARDIZEM  CD) 180 MG 24 hr capsule Take 1 capsule (180 mg total) by mouth daily. 90 capsule 3   diltiazem  (CARDIZEM ) 30 MG tablet TAKE 1 TABLET (30 MG TOTAL) BY MOUTH AS NEEDED (HEART RATE OVER 120). 90 tablet 1   ELIQUIS  5 MG TABS  tablet TAKE 1 TABLET BY MOUTH TWICE A DAY 180 tablet 1  ferrous gluconate  (FERGON) 324 MG tablet Take 1 tablet (324 mg total) by mouth daily. 90 tablet 2   furosemide  (LASIX ) 40 MG tablet Take 1 tablet (40 mg total) by mouth daily. (Patient taking differently: Take 40-80 mg by mouth daily as needed for edema.) 30 tablet 3   ipratropium-albuterol  (DUONEB) 0.5-2.5 (3) MG/3ML SOLN 1 NEBULE EVERY 6 HOURS AS NEEDED. **J45.3** (Patient taking differently: Take 3 mLs by nebulization every 6 (six) hours as needed.) 270 mL 4   isosorbide  mononitrate (IMDUR ) 30 MG 24 hr tablet TAKE 1 TABLET BY MOUTH EVERY DAY 90 tablet 0   meclizine  (ANTIVERT ) 25 MG tablet TAKE 1 TABLET BY MOUTH 3 TIMES DAILY AS NEEDED FOR DIZZINESS. 30 tablet 0   Olodaterol HCl (STRIVERDI RESPIMAT) 2.5 MCG/ACT AERS INHALE 2 PUFFS BY MOUTH INTO THE LUNGS DAILY (Patient taking differently: Inhale 2 puffs into the lungs 2 (two) times daily.) 4 g 11   ondansetron  (ZOFRAN -ODT) 8 MG disintegrating tablet Take 1 tablet (8 mg total) by mouth every 8 (eight) hours as needed for nausea. 20 tablet 3   oxyCODONE -acetaminophen  (PERCOCET) 5-325 MG tablet Take 1 tablet by mouth every 4 (four) hours as needed for severe pain (pain score 7-10). 30 tablet 0   pantoprazole  (PROTONIX ) 40 MG tablet TAKE 1 TABLET (40 MG TOTAL) BY MOUTH TWICE A DAY BEFORE MEALS 180 tablet 2   predniSONE  (DELTASONE ) 10 MG tablet Take 20 mg by mouth daily with breakfast.     pregabalin  (LYRICA ) 100 MG capsule Take 1 capsule (100 mg total) by mouth 2 (two) times daily. 60 capsule 2   tamsulosin  (FLOMAX ) 0.4 MG CAPS capsule Take 0.4 mg by mouth at bedtime.        Home:     Functional History:    Functional Status:  Mobility:          ADL:    Cognition:      Physical Exam: There were no vitals taken for this visit. Physical Exam  ***  Results for orders placed or performed during the hospital encounter of 11/08/23 (from the past 48 hours)  Basic metabolic  panel with GFR     Status: Abnormal   Collection Time: 11/15/23  7:44 AM  Result Value Ref Range   Sodium 134 (L) 135 - 145 mmol/L   Potassium 3.8 3.5 - 5.1 mmol/L   Chloride 105 98 - 111 mmol/L   CO2 21 (L) 22 - 32 mmol/L   Glucose, Bld 118 (H) 70 - 99 mg/dL    Comment: Glucose reference range applies only to samples taken after fasting for at least 8 hours.   BUN 15 8 - 23 mg/dL   Creatinine, Ser 9.45 (L) 0.61 - 1.24 mg/dL   Calcium  8.3 (L) 8.9 - 10.3 mg/dL   GFR, Estimated >39 >39 mL/min    Comment: (NOTE) Calculated using the CKD-EPI Creatinine Equation (2021)    Anion gap 8 5 - 15    Comment: Performed at [facility], [address] [place], [place] [address]  CBC     Status: Abnormal   Collection Time: 11/15/23  7:44 AM  Result Value Ref Range   WBC 11.4 (H) 4.0 - 10.5 K/uL   RBC 2.97 (L) 4.22 - 5.81 MIL/uL   Hemoglobin 9.0 (L) 13.0 - 17.0 g/dL   HCT 72.3 (L) 60.9 - 47.9 %   MCV 92.9 80.0 - 100.0 fL   MCH 30.3 26.0 - 34.0 pg   MCHC 32.6  30.0 - 36.0 g/dL   RDW 85.1 88.4 - 84.4 %   Platelets 191 150 - 400 K/uL   nRBC 0.0 0.0 - 0.2 %    Comment: Performed at [facility], [address], [place], [place] [address]  Basic metabolic panel with GFR     Status: Abnormal   Collection Time: 11/16/23  6:56 AM  Result Value Ref Range   Sodium 138 135 - 145 mmol/L   Potassium 3.0 (L) 3.5 - 5.1 mmol/L   Chloride 107 98 - 111 mmol/L   CO2 23 22 - 32 mmol/L   Glucose, Bld 79 70 - 99 mg/dL    Comment: Glucose reference range applies only to samples taken after fasting for at least 8 hours.   BUN 13 8 - 23 mg/dL   Creatinine, Ser 9.51 (L) 0.61 - 1.24 mg/dL   Calcium  8.1 (L) 8.9 - 10.3 mg/dL   GFR, Estimated >39 >39 mL/min    Comment: (NOTE) Calculated using the CKD-EPI Creatinine Equation (2021)    Anion gap 8 5 - 15    Comment: Performed at [facility], [address] [place], [place] [address]  CBC     Status: Abnormal   Collection Time: 11/16/23  6:56  AM  Result Value Ref Range   WBC 8.0 4.0 - 10.5 K/uL   RBC 3.05 (L) 4.22 - 5.81 MIL/uL   Hemoglobin 9.2 (L) 13.0 - 17.0 g/dL   HCT 72.0 (L) 60.9 - 47.9 %   MCV 91.5 80.0 - 100.0 fL   MCH 30.2 26.0 - 34.0 pg   MCHC 33.0 30.0 - 36.0 g/dL   RDW 84.8 88.4 - 84.4 %   Platelets 219 150 - 400 K/uL   nRBC 0.0 0.0 - 0.2 %    Comment: Performed at [facility], [address] [place], [place] [address]   *Note: Due to a large number of results and/or encounters for the requested time period, some results have not been displayed. A complete set of results can be found in Results Review.   No results found.    There were no vitals taken for this visit.  Medical Problem List and Plan: 1. Functional deficits secondary to R BKA d/t osteomyelitis 11/14/23 by Dr. Harden, hx of charcot joint  -patient may *** shower  -ELOS/Goals: 16-18 days ***  -f/up with Dr. Harden 1wk after d/c, continue prevena plus wound vac 2.  Antithrombotics: -DVT/anticoagulation:  Pharmaceutical: Eliquis  5mg  BID  -antiplatelet therapy: n/a 3. Pain Management/DDD/neuropathy: tylenol  and oxycodone  PRN, flexeril  PRN, voltaren  PRN, Lyrica  100mg  BID, Fioricet  PRN 4. Mood/Behavior/Sleep: ***  -antipsychotic agents: *** 5. Neuropsych/cognition: This patient *** capable of making decisions on *** own behalf. 6. Skin/Wound Care: routine skin checks, continue wound vac R BKA, wound care LUE wound 7. Fluids/Electrolytes/Nutrition/hypokalemia/hyponatremia: routine I&O, routine labs, continue vitamins/supplements, continue Kdur 20mEq daily 8. PAF on Eliquis  and Diltiazem  180mg  daily 9. HFpEF (EF 40-45% 02/2023): resume home Lasix  40mg  daily, daily weights 10. HTN: Continue Diltiazem  as above, home Imdur  on hold, resume as appropriate 11. HLD: continue Lipitor 20mg  daily 12. COPD/agent orange exposure: continue chronic Prednisone  20mg  daily, continue Brovana /Pulmicort  nebs, Mucinex  PRN, Duonebs PRN 13. BPH: continue Flomax  0.4mg   nightly 14. GERD: continue Protonix  40mg  BID, Maalox PRN 15. ?Gout/elevated uric acid: home med Allopurinol  100mg  qAM on hold, restart as appropriate.  16. Anemia, acute on chronic: Hgb 9-10 range during acute stay, monitor labs 17. Vitamin B12 deficiency: 1000mcg  SQ weekly ordered during admission, f/up on transition to oral per primary team 18. Constipation: LBM day of admission, continue Colace 100mg  BID and Miralax  daily  ***  [address], PA-C 11/16/2023

## 2023-11-16 NOTE — Progress Notes (Signed)
 Patient ID: Levi [name], male   DOB: 1945-09-23, 78 y.o.   MRN: [id number]  INPATIENT REHABILITATION ADMISSION NOTE   Arrival Method: Bed     Mental Orientation: x4   Assessment: see flowsheet   Skin: multiple skin tears, R-BKA   IV'S:RUE   Pain: None reported   Tubes and Drains: N/A   Safety Measures: In place   Vital Signs: See flowsheet   Height and Weight: See flowsheet   Rehab Orientation: Completed   Family: At bedside

## 2023-11-17 ENCOUNTER — Encounter (HOSPITAL_COMMUNITY): Payer: Self-pay | Admitting: Orthopedic Surgery

## 2023-11-17 DIAGNOSIS — D62 Acute posthemorrhagic anemia: Secondary | ICD-10-CM

## 2023-11-17 DIAGNOSIS — I10 Essential (primary) hypertension: Secondary | ICD-10-CM | POA: Diagnosis not present

## 2023-11-17 DIAGNOSIS — Z89511 Acquired absence of right leg below knee: Secondary | ICD-10-CM | POA: Diagnosis not present

## 2023-11-17 DIAGNOSIS — K5900 Constipation, unspecified: Secondary | ICD-10-CM

## 2023-11-17 DIAGNOSIS — E876 Hypokalemia: Secondary | ICD-10-CM

## 2023-11-17 LAB — COMPREHENSIVE METABOLIC PANEL WITH GFR
ALT: 31 U/L (ref 0–44)
AST: 32 U/L (ref 15–41)
Albumin: 1.9 g/dL — ABNORMAL LOW (ref 3.5–5.0)
Alkaline Phosphatase: 81 U/L (ref 38–126)
Anion gap: 5 (ref 5–15)
BUN: 12 mg/dL (ref 8–23)
CO2: 27 mmol/L (ref 22–32)
Calcium: 8.1 mg/dL — ABNORMAL LOW (ref 8.9–10.3)
Chloride: 104 mmol/L (ref 98–111)
Creatinine, Ser: 0.6 mg/dL — ABNORMAL LOW (ref 0.61–1.24)
GFR, Estimated: >60 mL/min (ref >60)
Glucose, Bld: 85 mg/dL (ref 70–99)
Potassium: 3.2 mmol/L — ABNORMAL LOW (ref 3.5–5.1)
Sodium: 136 mmol/L (ref 135–145)
Total Bilirubin: 0.5 mg/dL (ref 0.0–1.2)
Total Protein: 4.8 g/dL — ABNORMAL LOW (ref 6.5–8.1)

## 2023-11-17 LAB — CBC WITH DIFFERENTIAL/PLATELET
Abs Immature Granulocytes: 0.2 10*3/uL — ABNORMAL HIGH (ref 0.00–0.07)
Basophils Absolute: 0 10*3/uL (ref 0.0–0.1)
Basophils Relative: 0 %
Eosinophils Absolute: 0 10*3/uL (ref 0.0–0.5)
Eosinophils Relative: 0 %
HCT: 30.8 % — ABNORMAL LOW (ref 39.0–52.0)
Hemoglobin: 9.8 g/dL — ABNORMAL LOW (ref 13.0–17.0)
Lymphocytes Relative: 21 %
Lymphs Abs: 1.7 10*3/uL (ref 0.7–4.0)
MCH: 29.7 pg (ref 26.0–34.0)
MCHC: 31.8 g/dL (ref 30.0–36.0)
MCV: 93.3 fL (ref 80.0–100.0)
Monocytes Absolute: 0.6 10*3/uL (ref 0.1–1.0)
Monocytes Relative: 7 %
Myelocytes: 2 %
Neutro Abs: 5.5 10*3/uL (ref 1.7–7.7)
Neutrophils Relative %: 69 %
Platelets: 242 10*3/uL (ref 150–400)
Promyelocytes Relative: 1 %
RBC: 3.3 MIL/uL — ABNORMAL LOW (ref 4.22–5.81)
RDW: 15.3 % (ref 11.5–15.5)
WBC: 7.9 10*3/uL (ref 4.0–10.5)
nRBC: 0 % (ref 0.0–0.2)
nRBC: 0 /100{WBCs}

## 2023-11-17 MED ORDER — HYDROCERIN EX CREA
TOPICAL_CREAM | Freq: Every day | CUTANEOUS | Status: DC
  Filled 2023-11-17: qty 113

## 2023-11-17 MED ORDER — POTASSIUM CHLORIDE CRYS ER 20 MEQ PO TBCR
20.0000 meq | EXTENDED_RELEASE_TABLET | Freq: Once | ORAL | Status: AC
  Administered 2023-11-17: 20 meq via ORAL
  Filled 2023-11-17: qty 1

## 2023-11-17 NOTE — Anesthesia Postprocedure Evaluation (Signed)
 Anesthesia Post Note  Patient: Levi [name]  Procedure(s) Performed: AMPUTATION BELOW KNEE (Right: Knee)     Patient location during evaluation: PACU Anesthesia Type: General Level of consciousness: awake Pain management: pain level controlled Vital Signs Assessment: post-procedure vital signs reviewed and stable Respiratory status: spontaneous breathing, nonlabored ventilation and respiratory function stable Cardiovascular status: blood pressure returned to baseline and stable Postop Assessment: no apparent nausea or vomiting Anesthetic complications: no   No notable events documented.  Last Vitals:  Vitals:   11/16/23 0749 11/16/23 0836  BP:  (!) 134/90  Pulse:  92  Resp:  16  Temp:  36.6 C  SpO2: 100% 97%    Last Pain:  Vitals:   11/16/23 0931  TempSrc:   PainSc: 5                  Jashawn Floyd P Chaynce Schafer

## 2023-11-17 NOTE — Progress Notes (Signed)
 Inpatient Rehabilitation  Patient information reviewed and entered into eRehab system by [facility]. Karen Kays., CCC/SLP, PPS Coordinator.  Information including medical coding, functional ability and quality indicators will be reviewed and updated through discharge.

## 2023-11-17 NOTE — Progress Notes (Signed)
 Occupational Therapy Session Note  Patient Details  Name: Levi [name] MRN: [id number] Date of Birth: 1946/03/30  Session 1: {CHL IP REHAB OT TIME CALCULATIONS:304400400} Session 2: {CHL IP REHAB OT TIME CALCULATIONS:304400400}  Short Term Goals: Week 1:  OT Short Term Goal 1 (Week 1): Pt will transfer to the toilet/ BSC with mod A +1 OT Short Term Goal 2 (Week 1): Pt will be able to perform sit to stands in prep for ADL tasks with mod A +1 consistenly OT Short Term Goal 3 (Week 1): Pt will perform LB dressing with AEprn with mod A OT Short Term Goal 4 (Week 1): Pt will perform bed mobility in prep for ADL tasks without bed features with supervision  Skilled Therapeutic Interventions/Progress Updates:    Session 1: Patient agreeable to participate in OT session. Reports *** pain level.   Patient participated in skilled OT session focusing on ***. Therapist facilitated/assessed/developed/educated/integrated/elicited *** in order to improve/facilitate/promote   Session 2: Patient agreeable to participate in OT session. Reports *** pain level.   Patient participated in skilled OT session focusing on ***. Therapist facilitated/assessed/developed/educated/integrated/elicited *** in order to improve/facilitate/promote    Therapy Documentation Precautions:  Precautions Precautions: Fall Recall of Precautions/Restrictions: Intact Other Brace: RLE ampushield Restrictions Weight Bearing Restrictions Per Provider Order: Yes RLE Weight Bearing Per Provider Order: Non weight bearing Other Position/Activity Restrictions: no pillow under R knee  Therapy/Group: Individual Therapy  Leita Howell, OTR/L,CBIS  Supplemental OT - [facility] and [facility] Secure Chat Preferred   11/17/2023, 5:51 PM

## 2023-11-17 NOTE — Evaluation (Signed)
 Physical Therapy Assessment and Plan  Patient Details  Name: Levi [name] MRN: [id number] Date of Birth: [birth year]  PT Diagnosis: Abnormal posture, Abnormality of gait, Difficulty walking, Impaired sensation, Muscle weakness, and Pain in joint Rehab Potential: Fair ELOS: 16-18 days   Today's Date: 11/17/2023 PT Individual Time: 0801-0916 PT Individual Time Calculation (min): 75 min    Hospital Problem: Principal Problem:   S/P BKA (below knee amputation) unilateral, right (HCC)  Past Medical History:  Past Medical History:  Diagnosis Date   Agent orange exposure 1970's   takes Imdur ,Diltiazem , and Atacand  daily   Agent orange exposure    ALLERGIC RHINITIS    Allergy     seasonal allergies   Anemia    iron  deficiency   Arthritis    Asthma    uses inhaler and nebulizers   Atrial fibrillation (HCC)    Bruises easily    d/t prednisone  daily   Cataract    Cellulitis 03/22/2020   Chest wall pain 09/21/2019   L scapula, reported 4/27 21/     CHF (congestive heart failure) (HCC)    dx-on meds to treat   Chronic bronchitis (HCC)    used to get it q yr; last time ?2008 (04/28/2012)   Clotting disorder (HCC) 2019   On blood thinner when I bleed it is for a consideral amount of time. This is tegardless of size of wound.   COPD (chronic obstructive pulmonary disease) (HCC)    agent orange exposure   Degenerative disk disease    everywhere (04/28/2012)   Diverticulitis    Dysrhythmia    afib   Elevated uric acid in blood    takes Allopurinol  daily   Emphysema of lung (HCC)    Enlarged prostate    but not on any meds   GERD (gastroesophageal reflux disease)    takes Protonix  daily   H/O hiatal hernia    Headache    History of colonoscopy    History of pneumonia    a. 2010   Hyperlipidemia    takes Lipitor daily; pt. states he takes a preventive   Hypothyroidism    Joint pain    Joint swelling    Leukocytosis    MSSA bacteremia 03/19/2023   Nausea and vomiting  11/12/2022   Neuromuscular disorder (HCC)    bilat legs, and bilat arms   Neuropathy    Osteoporosis 2012   PAF (paroxysmal atrial fibrillation) (HCC)    a. Dx 04/2012, CHA2DS2VASc = 1 (age);  b. 04/2012 Echo: EF 77, mild MR.   Personal history of colonic adenomas 02/09/2013   Pneumonia    Sepsis (HCC) 03/18/2023   Shortness of breath dyspnea    Thyroid  disease    no taking meds-caused dizziness   Past Surgical History:  Past Surgical History:  Procedure Laterality Date   2 HOUR PH STUDY N/A 05/30/2021   Procedure: 24 HOUR PH STUDY;  Surgeon: San Sandor GAILS, DO;  Location: [facility];  Service: Gastroenterology;  Laterality: N/A;   AMPUTATION TOE Bilateral 08/13/2023   Procedure: LEFT SECOND TOE AMPUTATION AND RIGHT FIFTH TOE AMPUTATION;  Surgeon: Malvin Marsa FALCON, DPM;  Location: [facility];  Service: Orthopedics/Podiatry;  Laterality: Bilateral;   ANTERIOR CERVICAL DECOMP/DISCECTOMY FUSION N/A 07/21/2012   Procedure: ANTERIOR CERVICAL DECOMPRESSION/DISCECTOMY FUSION 2 LEVELS;  Surgeon: Victory Gens, MD;  Location: [facility];  Service: Neurosurgery;  Laterality: N/A;  C4-5 C5-6 Anterior cervical decompression/diskectomy/fusion   ANTERIOR LAT LUMBAR FUSION N/A 04/07/2017   Procedure:  Thoracic twelve-Lumbar one Anterolateral decompression/fusion;  Surgeon: Colon Shove, MD;  Location: [facility] OR;  Service: Neurosurgery;  Laterality: N/A;   ARTHRODESIS FOOT WITH WEIL OSTEOTOMY Left 11/07/2022   Procedure: LEFT THIRD METATARSOPHALANGEAL CAPSULOTOMY, WEIL OSTEOTOMY;  Surgeon: Elsa Lonni SAUNDERS, MD;  Location: [facility] OR;  Service: Orthopedics;  Laterality: Left;  LENGTH OF SURGERY: 60 MINUTES   BACK SURGERY     x 3   BONE EXOSTOSIS EXCISION Right 10/16/2023   Procedure: EXCISION, EXOSTOSIS;  Surgeon: Janit Thresa HERO, DPM;  Location: [facility];  Service: Orthopedics/Podiatry;  Laterality: Right;   CARDIAC CATHETERIZATION  11/2008   Dr. Jeffrie - 20% calcified non flow limiting left  main, 50% EF apical hypokinesis   CARPAL TUNNEL RELEASE Bilateral    CERVICAL DISC ARTHROPLASTY N/A 04/07/2017   Procedure: Cervical six-seven Disc arthroplasty;  Surgeon: Colon Shove, MD;  Location: [facility] OR;  Service: Neurosurgery;  Laterality: N/A;   CHEST TUBE INSERTION Left 04/11/2017   Procedure: CHEST TUBE INSERTION;  Surgeon: Fleeta Hanford Coy, MD;  Location: [facility] OR;  Service: Thoracic;  Laterality: Left;   CHOLECYSTECTOMY     COLONOSCOPY  2016   CG-MAC-miralax (good)servere TICS/TA x 2   ESOPHAGEAL MANOMETRY N/A 05/30/2021   Procedure: ESOPHAGEAL MANOMETRY (EM);  Surgeon: San Sandor GAILS, DO;  Location: [facility];  Service: Gastroenterology;  Laterality: N/A;  ph impedence   ESOPHAGOGASTRODUODENOSCOPY     EYE SURGERY Bilateral 2011   steroidal encapsulation (04/28/2012)   FOOT SURGERY Right    x 2   JOINT REPLACEMENT     Many surgeries including 5 joit replacements   KNEE ARTHROSCOPY Bilateral    LATERAL / POSTERIOR COMBINED FUSION LUMBAR SPINE  2012   LEFT ATRIAL APPENDAGE OCCLUSION N/A 02/22/2021   Procedure: LEFT ATRIAL APPENDAGE OCCLUSION;  Surgeon: Cindie Ole DASEN, MD;  [facility]: [facility];  Service: Cardiovascular;  Laterality: N/A;   NEUROPLASTY / TRANSPOSITION ULNAR NERVE AT ELBOW Bilateral    POLYPECTOMY  2016   severe TICS/TA x 2   POSTERIOR CERVICAL LAMINECTOMY WITH MET- RX Right 06/11/2021   Procedure: Right Cervical six-seven  Laminectomy and foraminotomy with metrex;  Surgeon: Colon Shove, MD;  Location: [facility];  Service: Neurosurgery;  Laterality: Right;   REFRACTIVE SURGERY Left 07/07/2023   laser surgery but still has floaters   REVERSE SHOULDER ARTHROPLASTY  04/28/2012   Procedure: REVERSE SHOULDER ARTHROPLASTY;  Surgeon: Eva Elsie Herring, MD;  Location: [facility] OR;  Service: Orthopedics;  Laterality: Left;  Left shouder reverse total shoulder arthroplasty   SHOULDER SURGERY     SPINE SURGERY     I have had 7 spinal surgeries   TEE  WITHOUT CARDIOVERSION N/A 02/22/2021   Procedure: TRANSESOPHAGEAL ECHOCARDIOGRAM (TEE);  Surgeon: Cindie Ole DASEN, MD;  Location: [facility] INVASIVE CV LAB;  Service: Cardiovascular;  Laterality: N/A;   TENDON REPAIR Right 08/07/2020   Procedure: RIGHT HAND LIGAMENT RECONSTRUCTION AND TENDON INTERPOSITION;  Surgeon: Yvone Rush, MD;  Location: [facility];  Service: Orthopedics;  Laterality: Right;   TONSILLECTOMY AND ADENOIDECTOMY  1954   TOTAL HIP ARTHROPLASTY Left 2011   left (04/28/2012)   TOTAL HIP ARTHROPLASTY Right 07/31/2015   Procedure: TOTAL HIP ARTHROPLASTY ANTERIOR APPROACH;  Surgeon: Rush Yvone, MD;  [facility];  Service: Orthopedics;  Laterality: Right;   TOTAL KNEE ARTHROPLASTY  01/10/2012   Procedure: TOTAL KNEE ARTHROPLASTY;  Surgeon: Rush LITTIE Yvone, MD;  Location: [facility];  Service: Orthopedics;;  left total knee arthroplasty   TOTAL KNEE  ARTHROPLASTY Right 06/17/2014   Procedure: TOTAL KNEE ARTHROPLASTY;  Surgeon: Norleen LITTIE Gavel, MD;  Location: [facility];  Service: Orthopedics;  Laterality: Right;   toupet fundolplication  11/01/2021   TRANSESOPHAGEAL ECHOCARDIOGRAM (CATH LAB) N/A 03/24/2023   Procedure: TRANSESOPHAGEAL ECHOCARDIOGRAM;  Surgeon: Mona Vinie BROCKS, MD;  [facility]: [facility];  Service: Cardiovascular;  Laterality: N/A;    Assessment & Plan Clinical Impression: HARTLEY URTON is a 78 y.o. male with a PMHx of PAF on eliquis , HFpEF (EF 40-45% in 02/2023), HTN, HLD, COPD/agent orange exposure, BPH, GERD, anemia, and s/p recent LLE 2nd digit/RLE 5th digit ray amputation on 08/13/2023 and R foot exostectomy 10/16/23, who presented to the ED on 11/08/23 with presyncope/fall 2/2 sepsis 2/2 RLE cellulitis/osteomyelitis. In the ED, tachycardic and tachypneic, labs showing K 2.9, Cr 1.05, albumin 2.9, WBC 13.8, and lactate 2.4, CRP 16.5, procal 1.92, ESR 30. CTH NAICA. CT C-spine no acute abnl. CXR no acute cardiopulmonary findings. XR R foot showed moderate  edema, advanced Charcot foot, and old fifth toe amputation and remote resection of the second through fourth proximal phalangeal heads. Pt received IV vancomycin , IV cefepime , and IV metronidazole  in the ED and was admitted. Vanc/cefepime /flagyl  were continued during admission, MRI R foot on 11/08/23 confirmed osteomyelitis and charcot degeneration. BCx NGTD x2. He underwent R BKA with Dr. Harden on 11/14/23 and abx were discontinued 24hrs after amputation. He requires comprehensive rehab program due to functional decline after his BKA. Patient transferred to CIR on 11/16/2023 .   Patient currently requires max with mobility secondary to muscle weakness and muscle joint tightness, unbalanced muscle activation and decreased coordination, and decreased motor planning.  Prior to hospitalization, patient was independent  with mobility and lived with Spouse in a House home.  Home access is 1, because he has now lives in lower levelStairs to enter.  Patient will benefit from skilled PT intervention to maximize safe functional mobility for planned discharge home with 24 hour supervision.  Anticipate patient will benefit from follow up HH at discharge.  PT - End of Session Activity Tolerance: Tolerates 30+ min activity with multiple rests Endurance Deficit: Yes PT Assessment Rehab Potential (ACUTE/IP ONLY): Fair PT Barriers to Discharge: Decreased caregiver support;Inaccessible home environment;Wound Care;Insurance for SNF coverage;Weight bearing restrictions PT Barriers to Discharge Comments: wound vac after discharge PT Patient demonstrates impairments in the following area(s): Balance;Pain;Safety;Skin Integrity;Motor;Perception;Sensory;Endurance PT Transfers Functional Problem(s): Bed Mobility;Bed to Chair;Car;Furniture;Floor PT Locomotion Functional Problem(s): Ambulation;Wheelchair Mobility;Stairs PT Plan PT Intensity: Minimum of 1-2 x/day ,45 to 90 minutes PT Frequency: 5 out of 7 days PT Duration  Estimated Length of Stay: 16-18 days PT Treatment/Interventions: Ambulation/gait training;Discharge planning;Functional mobility training;Therapeutic Activities;Therapeutic Exercise;Skin care/wound management;Neuromuscular re-education;Balance/vestibular training;Wheelchair propulsion/positioning;Pain management;DME/adaptive equipment instruction;Splinting/orthotics;UE/LE Strength taining/ROM;Stair training;UE/LE Coordination activities;Functional electrical stimulation;Patient/family education;Community reintegration;Psychosocial support PT Transfers Anticipated Outcome(s): CGA PT Locomotion Anticipated Outcome(s): minA ambulation / supervision with wc PT Recommendation Recommendations for Other Services: None Follow Up Recommendations: 24 hour supervision/assistance;Home health PT;Outpatient PT Patient destination: Home Equipment Recommended: To be determined Equipment Details: urinal   PT Evaluation Precautions/Restrictions Precautions Precautions: Fall Recall of Precautions/Restrictions: Intact Other Brace: RLE ampushield Restrictions Weight Bearing Restrictions Per Provider Order: Yes RLE Weight Bearing Per Provider Order: Non weight bearing Other Position/Activity Restrictions: no pillow under R knee General   Vital SignsTherapy Vitals Temp: 97.8 F (36.6 C) Temp Source: Oral Pulse Rate: 89 Resp: 17 BP: 111/72 Patient Position (if appropriate): Sitting Oxygen Therapy SpO2: 97 % O2 Device: Room Air Pain Pain Assessment  Pain Scale: 0-10 Pain Score: 2  Pain Interference Pain Interference Pain Effect on Sleep: 1. Rarely or not at all Pain Interference with Therapy Activities: 1. Rarely or not at all Pain Interference with Day-to-Day Activities: 1. Rarely or not at all Home Living/Prior Functioning Home Living Available Help at Discharge: Family Type of Home: House Home Access: Stairs to enter [employer] of Steps: 1, because he has now lives in lower  level Entrance Stairs-Rails: Left Home Layout: Able to live on main level with bedroom/bathroom Bathroom Shower/Tub: Health visitor: Handicapped height Bathroom Accessibility: Yes Additional Comments: Pt lives with wife, daughter lives close by, and other family who could help 24/7  Lives With: Spouse Prior Function Level of Independence: Independent with basic ADLs;Other (comment)  Able to Take Stairs?: Yes Driving: Yes Vocation: Retired Leisure: Hobbies-yes (Comment) (very active) Vision/Perception     Cognition Overall Cognitive Status: Within Functional Limits for tasks assessed Arousal/Alertness: Awake/alert Orientation Level: Oriented X4 Year: 2025 Month: June Day of Week: Correct Attention: Focused Focused Attention: Appears intact Memory: Appears intact Awareness: Appears intact Problem Solving: Appears intact Safety/Judgment: Appears intact Comments: intact Sensation Sensation Light Touch: (P) Appears Intact Proprioception: (P) Appears Intact Motor  Motor Motor: Within Functional Limits;Abnormal postural alignment and control   Trunk/Postural Assessment  Cervical Assessment Cervical Assessment: Within Functional Limits Thoracic Assessment Thoracic Assessment: Within Functional Limits Lumbar Assessment Lumbar Assessment: Exceptions to [facility] (spinal surgeries, dec rom) Postural Control Postural Control: Within Functional Limits  Balance Dynamic Sitting Balance Sitting balance - Comments: can sit up without hands and foot contact Extremity Assessment      RLE Assessment RLE Assessment: Within Functional Limits Passive Range of Motion (PROM) Comments: close to full extension Active Range of Motion (AROM) Comments: can move in all directions against gravity RLE AROM (degrees) Overall AROM Right Lower Extremity: Within functional limits for tasks assessed LLE Assessment LLE Assessment: Within Functional Limits Passive Range of Motion  (PROM) Comments: severe restriction of L knee flexion approx 0-90 deg, directly impacts sit to stand ability Active Range of Motion (AROM) Comments: wfl General Strength Comments: grossly 3+  Care Tool Care Tool Bed Mobility Roll left and right activity   Roll left and right assist level: Supervision/Verbal cueing    Sit to lying activity   Sit to lying assist level: 2 Helpers    Lying to sitting on side of bed activity   Lying to sitting on side of bed assist level: the ability to move from lying on the back to sitting on the side of the bed with no back support.: Minimal Assistance - Patient > 75%     Care Tool Transfers Sit to stand transfer   Sit to stand assist level: 2 Helpers    Chair/bed transfer Chair/bed transfer activity did not occur: Safety/medical concerns      Licensed conveyancer transfer activity did not occur: Safety/medical concerns        Care Tool Locomotion Ambulation Ambulation activity did not occur: Safety/medical concerns        Walk 10 feet activity Walk 10 feet activity did not occur: Safety/medical concerns       Walk 50 feet with 2 turns activity Walk 50 feet with 2 turns activity did not occur: Safety/medical concerns      Walk 150 feet activity Walk 150 feet activity did not occur: Safety/medical concerns      Walk 10 feet on uneven surfaces activity Walk 10 feet on uneven surfaces activity did  not occur: Safety/medical concerns      Stairs Stair activity did not occur: Safety/medical concerns        Walk up/down 1 step activity Walk up/down 1 step or curb (drop down) activity did not occur: Safety/medical concerns      Walk up/down 4 steps activity Walk up/down 4 steps activity did not occur: Safety/medical concerns      Walk up/down 12 steps activity Walk up/down 12 steps activity did not occur: Safety/medical concerns   Walk up/down 12 steps assistive device: Other (comment)  Pick up small objects from floor Pick up small object  from the floor (from standing position) activity did not occur: Safety/medical concerns      Wheelchair Is the patient using a wheelchair?: Yes Type of Wheelchair: Manual Wheelchair activity did not occur: Safety/medical concerns      Wheel 50 feet with 2 turns activity Wheelchair 50 feet with 2 turns activity did not occur: Safety/medical concerns    Wheel 150 feet activity Wheelchair 150 feet activity did not occur: Safety/medical concerns      Refer to Care Plan for Long Term Goals  SHORT TERM GOAL WEEK 1 PT Short Term Goal 1 (Week 1): Pt will be minA for sit to stand PT Short Term Goal 2 (Week 1): Pt will be minA for bed mobility PT Short Term Goal 3 (Week 1): Pt will start wc mobility PT Short Term Goal 4 (Week 1): Pt will be modA for stand pivot transfer  Recommendations for other services: None   Skilled Therapeutic Intervention Mobility Bed Mobility Bed Mobility: Rolling Right;Rolling Left;Left Sidelying to Sit;Sit to Sidelying Left Rolling Right: Supervision/verbal cueing Rolling Left: Supervision/Verbal cueing Left Sidelying to Sit: Moderate Assistance - Patient 50-74% Sit to Sidelying Left: Maximal Assistance - Patient 25-49% Transfers Transfers: Sit to Stand;Stand to Sit Sit to Stand: 2 Helpers;Maximal Assistance - Patient 25-49% Stand to Sit: 2 Helpers;Maximal Assistance - Patient 25-49% Transfer (Assistive device): Standard walker Locomotion  Gait Ambulation: No Gait Gait: No Stairs / Additional Locomotion Stairs: No Wheelchair Mobility Wheelchair Mobility: No  Treatment Note  Pt was alert and agreeable to therapy today, on entrance pt was lying in bed and awake. Pt reports that he is having some surgical and phantom pain on his R limb. Pt reports that he feels good enough to try therapy. Pt was wearing limb guard with would vac without drainage. Pt was able to sit to stand with 2 helpers, currently struggles with moving his foot backwards due to knee  flexion restriction. Pt was very fatigued from the sit to stands and struggled to laterally scoot to [facility]. Pt needed maxA to return to lying and was left with bed alarm on and all needs within reach.    Discharge Criteria: Patient will be discharged from PT if patient refuses treatment 3 consecutive times without medical reason, if treatment goals not met, if there is a change in medical status, if patient makes no progress towards goals or if patient is discharged from hospital.  The above assessment, treatment plan, treatment alternatives and goals were discussed and mutually agreed upon: by patient  Johnetta Cordon 11/17/2023, 10:24 AM

## 2023-11-17 NOTE — Evaluation (Signed)
 Occupational Therapy Assessment and Plan  Patient Details  Name: Levi [name] MRN: [id number] Date of Birth: Feb 24, 1946  OT Diagnosis: acute pain and muscle weakness (generalized) Rehab Potential: Rehab Potential (ACUTE ONLY): Good ELOS: ~ 3 weeks   Today's Date: 11/17/2023 OT Individual Time: 1000-1115 OT Individual Time Calculation (min): 75 min     Hospital Problem: Principal Problem:   S/P BKA (below knee amputation) unilateral, right (HCC)   Past Medical History:  Past Medical History:  Diagnosis Date   Agent orange exposure 1970's   takes Imdur ,Diltiazem , and Atacand  daily   Agent orange exposure    ALLERGIC RHINITIS    Allergy     seasonal allergies   Anemia    iron  deficiency   Arthritis    Asthma    uses inhaler and nebulizers   Atrial fibrillation (HCC)    Bruises easily    d/t prednisone  daily   Cataract    Cellulitis 03/22/2020   Chest wall pain 09/21/2019   L scapula, reported 4/27 21/     CHF (congestive heart failure) (HCC)    dx-on meds to treat   Chronic bronchitis (HCC)    used to get it q yr; last time ?2008 (04/28/2012)   Clotting disorder (HCC) 2019   On blood thinner when I bleed it is for a consideral amount of time. This is tegardless of size of wound.   COPD (chronic obstructive pulmonary disease) (HCC)    agent orange exposure   Degenerative disk disease    everywhere (04/28/2012)   Diverticulitis    Dysrhythmia    afib   Elevated uric acid in blood    takes Allopurinol  daily   Emphysema of lung (HCC)    Enlarged prostate    but not on any meds   GERD (gastroesophageal reflux disease)    takes Protonix  daily   H/O hiatal hernia    Headache    History of colonoscopy    History of pneumonia    a. 2010   Hyperlipidemia    takes Lipitor daily; pt. states he takes a preventive   Hypothyroidism    Joint pain    Joint swelling    Leukocytosis    MSSA bacteremia 03/19/2023   Nausea and vomiting 11/12/2022   Neuromuscular  disorder (HCC)    bilat legs, and bilat arms   Neuropathy    Osteoporosis 2012   PAF (paroxysmal atrial fibrillation) (HCC)    a. Dx 04/2012, CHA2DS2VASc = 1 (age);  b. 04/2012 Echo: EF 40-50%, mild MR.   Personal history of colonic adenomas 02/09/2013   Pneumonia    Sepsis (HCC) 03/18/2023   Shortness of breath dyspnea    Thyroid  disease    no taking meds-caused dizziness   Past Surgical History:  Past Surgical History:  Procedure Laterality Date   30 HOUR PH STUDY N/A 05/30/2021   Procedure: 24 HOUR PH STUDY;  Surgeon: San Sandor GAILS, DO;  Location: [facility];  Service: Gastroenterology;  Laterality: N/A;   AMPUTATION Right 11/14/2023   Procedure: AMPUTATION BELOW KNEE;  Surgeon: Harden Jerona GAILS, MD;  Location: [facility] OR;  Service: Orthopedics;  Laterality: Right;   AMPUTATION TOE Bilateral 08/13/2023   Procedure: LEFT SECOND TOE AMPUTATION AND RIGHT FIFTH TOE AMPUTATION;  Surgeon: Malvin Marsa FALCON, DPM;  Location: [facility] OR;  Service: Orthopedics/Podiatry;  Laterality: Bilateral;   ANTERIOR CERVICAL DECOMP/DISCECTOMY FUSION N/A 07/21/2012   Procedure: ANTERIOR CERVICAL DECOMPRESSION/DISCECTOMY FUSION 2 LEVELS;  Surgeon: Victory Gens, MD;  Location: [facility]  NEURO ORS;  Service: Neurosurgery;  Laterality: N/A;  C4-5 C5-6 Anterior cervical decompression/diskectomy/fusion   ANTERIOR LAT LUMBAR FUSION N/A 04/07/2017   Procedure: Thoracic twelve-Lumbar one Anterolateral decompression/fusion;  Surgeon: Colon Shove, MD;  Location: [facility] OR;  Service: Neurosurgery;  Laterality: N/A;   ARTHRODESIS FOOT WITH WEIL OSTEOTOMY Left 11/07/2022   Procedure: LEFT THIRD METATARSOPHALANGEAL CAPSULOTOMY, WEIL OSTEOTOMY;  Surgeon: Elsa Lonni SAUNDERS, MD;  Location: [facility] OR;  Service: Orthopedics;  Laterality: Left;  LENGTH OF SURGERY: 60 MINUTES   BACK SURGERY     x 3   BONE EXOSTOSIS EXCISION Right 10/16/2023   Procedure: EXCISION, EXOSTOSIS;  Surgeon: Janit Thresa HERO, DPM;  Location: [facility] ORS;  Service:  Orthopedics/Podiatry;  Laterality: Right;   CARDIAC CATHETERIZATION  11/2008   Dr. Jeffrie - 20% calcified non flow limiting left main, 50% EF apical hypokinesis   CARPAL TUNNEL RELEASE Bilateral    CERVICAL DISC ARTHROPLASTY N/A 04/07/2017   Procedure: Cervical six-seven Disc arthroplasty;  Surgeon: Colon Shove, MD;  Location: [facility] OR;  Service: Neurosurgery;  Laterality: N/A;   CHEST TUBE INSERTION Left 04/11/2017   Procedure: CHEST TUBE INSERTION;  Surgeon: Fleeta Hanford Coy, MD;  Location: [facility] OR;  Service: Thoracic;  Laterality: Left;   CHOLECYSTECTOMY     COLONOSCOPY  2016   CG-MAC-miralax (good)servere TICS/TA x 2   ESOPHAGEAL MANOMETRY N/A 05/30/2021   Procedure: ESOPHAGEAL MANOMETRY (EM);  Surgeon: San Sandor GAILS, DO;  Location: [facility];  Service: Gastroenterology;  Laterality: N/A;  ph impedence   ESOPHAGOGASTRODUODENOSCOPY     EYE SURGERY Bilateral 2011   steroidal encapsulation (04/28/2012)   FOOT SURGERY Right    x 2   JOINT REPLACEMENT     Many surgeries including 5 joit replacements   KNEE ARTHROSCOPY Bilateral    LATERAL / POSTERIOR COMBINED FUSION LUMBAR SPINE  2012   LEFT ATRIAL APPENDAGE OCCLUSION N/A 02/22/2021   Procedure: LEFT ATRIAL APPENDAGE OCCLUSION;  Surgeon: Cindie Ole DASEN, MD;  [facility]: [facility];  Service: Cardiovascular;  Laterality: N/A;   NEUROPLASTY / TRANSPOSITION ULNAR NERVE AT ELBOW Bilateral    POLYPECTOMY  2016   severe TICS/TA x 2   POSTERIOR CERVICAL LAMINECTOMY WITH MET- RX Right 06/11/2021   Procedure: Right Cervical six-seven  Laminectomy and foraminotomy with metrex;  Surgeon: Colon Shove, MD;  Location: [facility] OR;  Service: Neurosurgery;  Laterality: Right;   REFRACTIVE SURGERY Left 07/07/2023   laser surgery but still has floaters   REVERSE SHOULDER ARTHROPLASTY  04/28/2012   Procedure: REVERSE SHOULDER ARTHROPLASTY;  Surgeon: Eva Elsie Herring, MD;  Location: [facility] OR;  Service: Orthopedics;  Laterality: Left;  Left  shouder reverse total shoulder arthroplasty   SHOULDER SURGERY     SPINE SURGERY     I have had 7 spinal surgeries   TEE WITHOUT CARDIOVERSION N/A 02/22/2021   Procedure: TRANSESOPHAGEAL ECHOCARDIOGRAM (TEE);  Surgeon: Cindie Ole DASEN, MD;  Location: [facility] INVASIVE CV LAB;  Service: Cardiovascular;  Laterality: N/A;   TENDON REPAIR Right 08/07/2020   Procedure: RIGHT HAND LIGAMENT RECONSTRUCTION AND TENDON INTERPOSITION;  Surgeon: Yvone Rush, MD;  Location: [facility];  Service: Orthopedics;  Laterality: Right;   TONSILLECTOMY AND ADENOIDECTOMY  1954   TOTAL HIP ARTHROPLASTY Left 2011   left (04/28/2012)   TOTAL HIP ARTHROPLASTY Right 07/31/2015   Procedure: TOTAL HIP ARTHROPLASTY ANTERIOR APPROACH;  Surgeon: Rush Yvone, MD;  Location: [facility];  Service: Orthopedics;  Laterality: Right;   TOTAL KNEE ARTHROPLASTY  01/10/2012   Procedure:  TOTAL KNEE ARTHROPLASTY;  Surgeon: Norleen LITTIE Gavel, MD;  Location: [facility] OR;  Service: Orthopedics;;  left total knee arthroplasty   TOTAL KNEE ARTHROPLASTY Right 06/17/2014   Procedure: TOTAL KNEE ARTHROPLASTY;  Surgeon: Norleen LITTIE Gavel, MD;  Location: [facility] OR;  Service: Orthopedics;  Laterality: Right;   toupet fundolplication  11/01/2021   TRANSESOPHAGEAL ECHOCARDIOGRAM (CATH LAB) N/A 03/24/2023   Procedure: TRANSESOPHAGEAL ECHOCARDIOGRAM;  Surgeon: Mona Vinie BROCKS, MD;  Location: [facility];  Service: Cardiovascular;  Laterality: N/A;    Assessment & Plan Clinical Impression: Patient is a 78 y.o. year old male PMHx of PAF on eliquis , HFpEF (EF 40-45% in 02/2023), HTN, HLD, COPD/agent orange exposure, BPH, GERD, anemia, and s/p recent LLE 2nd digit/RLE 5th digit ray amputation on 08/13/2023 and R foot exostectomy 10/16/23, who presented to the ED on 11/08/23 with presyncope/fall 2/2 sepsis 2/2 RLE cellulitis/osteomyelitis. In the ED, tachycardic and tachypneic, labs showing K 2.9, Cr 1.05, albumin 2.9, WBC 13.8, and lactate 2.4, CRP 16.5,  procal 1.92, ESR 30. CTH NAICA. CT C-spine no acute abnl. CXR no acute cardiopulmonary findings. XR R foot showed moderate edema, advanced Charcot foot, and old fifth toe amputation and remote resection of the second through fourth proximal phalangeal heads. Pt received IV vancomycin , IV cefepime , and IV metronidazole  in the ED and was admitted. Vanc/cefepime /flagyl  were continued during admission, MRI R foot on 11/08/23 confirmed osteomyelitis and charcot degeneration. BCx NGTD x2. He underwent R BKA with Dr. Harden on 11/14/23 and abx were discontinued 24hrs after amputation. He requires comprehensive rehab program due to functional decline after his BKA.   Patient transferred to CIR on 11/16/2023 .    Patient currently requires max to total A  with basic self-care skills and max A for basic transfers secondary to muscle weakness and acute pain, knowledge of care for residual limb and preparation for prosthesis , decreased cardiorespiratoy endurance, decreased coordination, and decreased sitting balance, decreased standing balance, and decreased balance strategies.  Prior to hospitalization, patient could complete ADL with set up to min A .  Patient will benefit from skilled intervention to decrease level of assist with basic self-care skills and increase independence with basic self-care skills prior to discharge home with care partner.  Anticipate patient will require intermittent supervision and follow up home health.  OT - End of Session Activity Tolerance: Tolerates 10 - 20 min activity with multiple rests Endurance Deficit: Yes OT Assessment Rehab Potential (ACUTE ONLY): Good OT Patient demonstrates impairments in the following area(s): Balance;Pain;Perception;Safety;Endurance;Motor;Skin Integrity;Sensory OT Basic ADL's Functional Problem(s): Bathing;Dressing;Toileting OT Transfers Functional Problem(s): Toilet;Tub/Shower OT Additional Impairment(s): None OT Plan OT Intensity: Minimum of 1-2  x/day, 45 to 90 minutes OT Frequency: 5 out of 7 days OT Duration/Estimated Length of Stay: ~ 3 weeks OT Treatment/Interventions: Balance/vestibular training;Discharge planning;Pain management;Self Care/advanced ADL retraining;Therapeutic Activities;UE/LE Coordination activities;Disease mangement/prevention;Functional mobility training;Patient/family education;Skin care/wound managment;Therapeutic Exercise;Community reintegration;DME/adaptive equipment instruction;Neuromuscular re-education;Psychosocial support;Splinting/orthotics;UE/LE Strength taining/ROM;Wheelchair propulsion/positioning OT Self Feeding Anticipated Outcome(s): n/a OT Basic Self-Care Anticipated Outcome(s): min A OT Toileting Anticipated Outcome(s): min A OT Bathroom Transfers Anticipated Outcome(s): contact guard OT Recommendation Recommendations for Other Services: Neuropsych consult Patient destination: Home Follow Up Recommendations: Home health OT Equipment Recommended: To be determined   OT Evaluation Precautions/Restrictions  Precautions Precautions: Fall Other Brace: RLE ampushield Restrictions RLE Weight Bearing Per Provider Order: Non weight bearing General Chart Reviewed: Yes Family/Caregiver Present: Yes (wife [name]) Vital Signs   Pain   Home Living/Prior Functioning Home Living Family/patient expects to be  discharged to:: Private residence Living Arrangements: Spouse/significant other Available Help at Discharge: Family Type of Home: House Home Access: Stairs to enter [employer] of Steps: 1, because he has now lives in lower level Entrance Stairs-Rails: Left Home Layout: Able to live on main level with bedroom/bathroom Bathroom Shower/Tub: Health visitor: Handicapped height Bathroom Accessibility: Yes Additional Comments: Pt lives with wife, daughter lives close by, and other family who could help 24/7  Lives With: Spouse Prior Function Level of Independence:  Independent with basic ADLs, Other (comment)  Able to Take Stairs?: Yes Driving: Yes Vocation: Retired Leisure: Hobbies-yes (Comment) (very active) Vision Baseline Vision/History: 1 Wears glasses Ability to See in Adequate Light: 0 Adequate Patient Visual Report: No change from baseline Vision Assessment?: No apparent visual deficits Perception  Perception: Within Functional Limits Praxis Praxis: WFL Cognition Cognition Overall Cognitive Status: Within Functional Limits for tasks assessed Arousal/Alertness: Awake/alert Orientation Level: Person;Place;Situation Person: Oriented Place: Oriented Situation: Oriented Memory: Appears intact Attention: Sustained Focused Attention: Appears intact Sustained Attention: Appears intact Awareness: Appears intact Problem Solving: Appears intact Safety/Judgment: Appears intact Comments: intact Brief Interview for Mental Status (BIMS) Repetition of Three Words (First Attempt): 3 Temporal Orientation: Year: Correct Temporal Orientation: Month: Accurate within 5 days Temporal Orientation: Day: Correct Recall: Sock: Yes, no cue required Recall: Blue: Yes, no cue required Recall: Bed: No, could not recall BIMS Summary Score: 13 Sensation Sensation Light Touch: Impaired Detail Light Touch Impaired Details: Impaired RLE;Impaired LLE Proprioception: (P) Appears Intact Coordination Gross Motor Movements are Fluid and Coordinated: No Fine Motor Movements are Fluid and Coordinated: Yes Motor  Motor Motor: Within Functional Limits;Abnormal postural alignment and control Motor - Skilled Clinical Observations: acute pain  Trunk/Postural Assessment  Cervical Assessment Cervical Assessment: Within Functional Limits Thoracic Assessment Thoracic Assessment: Within Functional Limits (rounded shoulders) Lumbar Assessment Lumbar Assessment:  (posterior pelvic tilt) Postural Control Postural Control: Deficits on evaluation Trunk Control:  leans posterior Righting Reactions: pt with delayed repsonses  Balance Balance Balance Assessed: Yes Static Sitting Balance Static Sitting - Balance Support: No upper extremity supported Static Sitting - Level of Assistance: 5: Stand by assistance Dynamic Sitting Balance Dynamic Sitting - Balance Support: During functional activity Dynamic Sitting - Level of Assistance: 4: Min assist Sitting balance - Comments: can sit up without hands and foot contact Static Standing Balance Static Standing - Balance Support: During functional activity Static Standing - Level of Assistance: 1: +2 Total assist Static Standing - Comment/# of Minutes: Pt still trying to put weight through his left LE when going to stand up Extremity/Trunk Assessment RUE Assessment RUE Assessment: Within Functional Limits LUE Assessment LUE Assessment: Within Functional Limits  Care Tool Care Tool Self Care Eating   Eating Assist Level: Set up assist    Oral Care    Oral Care Assist Level: Set up assist    Bathing   Body parts bathed by patient: Right arm;Left arm;Chest;Abdomen;Right upper leg;Left upper leg;Face Body parts bathed by helper: Front perineal area;Buttocks;Left lower leg Body parts n/a: Right lower leg Assist Level: Moderate Assistance - Patient 50 - 74%    Upper Body Dressing(including orthotics)   What is the patient wearing?: Pull over shirt   Assist Level: Moderate Assistance - Patient 50 - 74%    Lower Body Dressing (excluding footwear)   What is the patient wearing?: Pants;Incontinence brief Assist for lower body dressing: Total Assistance - Patient < 25%    Putting on/Taking off footwear   What is the patient wearing?:  Socks;Shoes Assist for footwear: Total Assistance - Patient < 25%       Care Tool Toileting Toileting activity   Assist for toileting: Total Assistance - Patient < 25%     Care Tool Bed Mobility Roll left and right activity   Roll left and right assist level:  Supervision/Verbal cueing    Sit to lying activity   Sit to lying assist level: 2 Helpers    Lying to sitting on side of bed activity   Lying to sitting on side of bed assist level: the ability to move from lying on the back to sitting on the side of the bed with no back support.: Minimal Assistance - Patient > 75%     Care Tool Transfers Sit to stand transfer Sit to stand activity did not occur: N/A Sit to stand assist level: 2 Helpers    Chair/bed transfer Chair/bed transfer activity did not occur: N/A Chair/bed transfer assist level: Total Assistance - Patient < 25%     Toilet transfer Toilet transfer activity did not occur: Safety/medical concerns       Care Tool Cognition  Expression of Ideas and Wants Expression of Ideas and Wants: 4. Without difficulty (complex and basic) - expresses complex messages without difficulty and with speech that is clear and easy to understand  Understanding Verbal and Non-Verbal Content Understanding Verbal and Non-Verbal Content: 4. Understands (complex and basic) - clear comprehension without cues or repetitions   Memory/Recall Ability     Refer to Care Plan for Long Term Goals  SHORT TERM GOAL WEEK 1 OT Short Term Goal 1 (Week 1): Pt will transfer to the toilet/ BSC with mod A +1 OT Short Term Goal 2 (Week 1): Pt will be able to perform sit to stands in prep for ADL tasks with mod A +1 consistenly OT Short Term Goal 3 (Week 1): Pt will perform LB dressing with AEprn with mod A OT Short Term Goal 4 (Week 1): Pt will perform bed mobility in prep for ADL tasks without bed features with supervision  Recommendations for other services: Neuropsych   Skilled Therapeutic Intervention 1:! Ot eval initiated with OT purpose, role and goals discussed with pt and pt's wife. Self care retraining at EOB. Pt able to come up into sitting EOB with min A without bed rails or bed features. Pt still with wound vac; discussed showering after it comes off and  transitioning to showering in home setting. Pt requires min A for dynamic balance to maintain hip position at neutral. Pt does use a reacher at home to thread LB clothing and wife assist with shoes. Attempted 3 times to stand from elevated EOB with max A however pt with difficulty with maintain his left leg underneath him and not wanting to put his right Foot down- resulting in his right hip/ LE coming down. PT unable to come into fully upright position with terminal hip and knee extension on the left. Pt perform scoot transfer to the right with mod A with max instructional cues for sequencing. Educated on mechanics to w/c. Pt left sitting up in w/c at end of session with wife at bedside.   ADL ADL Grooming: Minimal assistance Where Assessed-Grooming: Sitting at sink Upper Body Bathing: Contact guard Where Assessed-Upper Body Bathing: Edge of bed Lower Body Bathing: Maximal assistance Where Assessed-Lower Body Bathing: Edge of bed Upper Body Dressing: Minimal assistance Where Assessed-Upper Body Dressing: Edge of bed Lower Body Dressing: Maximal assistance Where Assessed-Lower Body Dressing: Edge of  bed Mobility  Bed Mobility Bed Mobility: Rolling Right;Rolling Left;Left Sidelying to Sit;Sit to Sidelying Left Rolling Right: Supervision/verbal cueing Rolling Left: Supervision/Verbal cueing Left Sidelying to Sit: Moderate Assistance - Patient 50-74% Sit to Sidelying Left: Maximal Assistance - Patient 25-49% Transfers Sit to Stand: Total Assistance - Patient < 25% Stand to Sit: Total Assistance - Patient < 25%   Discharge Criteria: Patient will be discharged from OT if patient refuses treatment 3 consecutive times without medical reason, if treatment goals not met, if there is a change in medical status, if patient makes no progress towards goals or if patient is discharged from hospital.  The above assessment, treatment plan, treatment alternatives and goals were discussed and mutually  agreed upon: by patient and by family  Claudene Delon Levy 11/17/2023, 12:14 PM

## 2023-11-17 NOTE — Progress Notes (Signed)
 Physical Therapy Session Note  Patient Details  Name: Levi [name] MRN: [id number] Date of Birth: 1946-04-30  Today's Date: 11/17/2023 PT Individual Time: 1300-1343 PT Individual Time Calculation (min): 43 min   Short Term Goals: Week 1:  PT Short Term Goal 1 (Week 1): Pt will be minA for sit to stand PT Short Term Goal 2 (Week 1): Pt will be minA for bed mobility PT Short Term Goal 3 (Week 1): Pt will start wc mobility PT Short Term Goal 4 (Week 1): Pt will be modA for stand pivot transfer  Skilled Therapeutic Interventions/Progress Updates:      Pt falling asleep in [facility] upon arrival. Pt awoken and agreeable to therapy. Pt wife in room. Pt denies any pain. Pt and pt wife report increased fatigue 2/2 previous therapies.   Pt performed slide board transfer WC to hospital bed to L with increased time and +2 mod A for safety 2/2 increased fatigue. Therapist provided demonstration and verbal/tactile cues for technique/sequencing.    Sit to supine with mod A for trunk/LE support and increased time.   Pt performed the following exercises for B LE strengthening/ROM/contracture prevention and independence with mobility  1x10 R LE hip abduction   1x10 R LE SLR  1x10 single leg glute bridge-limited to no clearance however pt demonstrates activation.   Therapist verified home entry. Pt and pt wife reports entrance to utility room (with access to bedroom/bathroom/kitchen has ramp entrance.) Therapist provided home measurement sheet. Verified pt has RW at home.  Education provided for R LE positioning to reduce knee flexion contracture. Education provided regarding purpose of limb protector, and R LE shrinker.   Pt supine in bed with all needs within reach and bed alarm on.   Therapy Documentation Precautions:  Precautions Precautions: Fall Recall of Precautions/Restrictions: Intact Other Brace: RLE ampushield Restrictions Weight Bearing Restrictions Per Provider Order: Yes RLE Weight  Bearing Per Provider Order: Non weight bearing Other Position/Activity Restrictions: no pillow under R knee   Therapy/Group: Individual Therapy  [facility] [place], Leonard, DPT  11/17/2023, 4:01 PM

## 2023-11-17 NOTE — Progress Notes (Signed)
 Patient ID: Levi [name], male   DOB: 11-Dec-1945, 78 y.o.   MRN: [id number] Met with the patient to review current medical situation, rehab process, team conference and plan of care. Reviewed secondary risk management including HF,, HTN, HLD and COPD.  Reviewed skin care to non- affected limb with prevalon boot and heel foam. NPWV on right BKA and shrinker with ampu-shield in the room. Discussed HH diet and nutritional supplements to promote healing.  Continue to follow along to address educational needs to facilitate preparation for discharge. [name]

## 2023-11-17 NOTE — Plan of Care (Signed)
 Problem: Sit to Stand Goal: LTG:  Patient will perform sit to stand with assistance level (PT) Description: LTG:  Patient will perform sit to stand with assistance level (PT) Flowsheets (Taken 11/17/2023 1457 by Gerianne Hardy, Student-PT) LTG: PT will perform sit to stand in preparation for functional mobility with assistance level: Contact Guard/Touching assist   Problem: RH Car Transfers Goal: LTG Patient will perform car transfers with assist (PT) Description: LTG: Patient will perform car transfers with assistance (PT). Flowsheets (Taken 11/17/2023 1504) LTG: Pt will perform car transfers with assist:: Contact Guard/Touching assist   Problem: RH Ambulation Goal: LTG Patient will ambulate in controlled environment (PT) Description: LTG: Patient will ambulate in a controlled environment, # of feet with assistance (PT). Flowsheets Taken 11/17/2023 1504 by Thaddeus Mliss LABOR, PT LTG: Ambulation distance in controlled environment: 15 ft using LRAD Taken 11/17/2023 1457 by Gerianne Hardy, Student-PT LTG: Pt will ambulate in controlled environ  assist needed:: Moderate Assistance - Patient 50 - 74% Goal: LTG Patient will ambulate in home environment (PT) Description: LTG: Patient will ambulate in home environment, # of feet with assistance (PT). Flowsheets Taken 11/17/2023 1504 by Thaddeus Mliss LABOR, PT LTG: Ambulation distance in home environment: 10 ft using LRAD Taken 11/17/2023 1450 by Hightower, Terris, Student-PT LTG: Pt will ambulate in home environ  assist needed:: Moderate Assistance - Patient 50 - 74%   Problem: RH Wheelchair Mobility Goal: LTG Patient will propel w/c in controlled environment (PT) Description: LTG: Patient will propel wheelchair in controlled environment, # of feet with assist (PT) Flowsheets Taken 11/17/2023 1504 by Thaddeus Mliss LABOR, PT LTG: Propel w/c distance in controlled environment: at least 100 ft Taken 11/17/2023 1450 by Gerianne, Terris, Student-PT LTG: Pt  will propel w/c in controlled environ  assist needed:: Independent with assistive device Goal: LTG Patient will propel w/c in home environment (PT) Description: LTG: Patient will propel wheelchair in home environment, # of feet with assistance (PT). Flowsheets Taken 11/17/2023 1504 by Thaddeus Mliss LABOR, PT LTG: Propel w/c distance in home environment: 50 ft Taken 11/17/2023 1450 by Gerianne, Terris, Student-PT LTG: Pt will propel w/c in home environ  assist needed:: Supervision/Verbal cueing Goal: LTG Patient will propel w/c in community environment (PT) Description: LTG: Patient will propel wheelchair in community environment, # of feet with assist (PT) Flowsheets Taken 11/17/2023 1504 by Thaddeus Mliss LABOR, PT [facility]:Emnezo w/c distance in community environment: 250 ft Taken 11/17/2023 1450 by Gerianne, Terris, Student-PT LTG: Pt will propel w/c in community environ  assist needed:: Supervision/Verbal cueing   Problem: RH Stairs Goal: LTG Patient will ambulate up and down stairs w/assist (PT) Description: LTG: Patient will ambulate up and down # of stairs with assistance (PT) Flowsheets (Taken 11/17/2023 1504) LTG: Pt will ambulate up/down stairs assist needed:: Moderate Assistance - Patient 50 - 74% LTG: Pt will  ambulate up and down number of stairs: 1 step using HR as per home setup   Problem: RH Bed Mobility Goal: LTG Patient will perform bed mobility with assist (PT) Description: LTG: Patient will perform bed mobility with assistance, with/without cues (PT). Flowsheets (Taken 11/17/2023 1450 by Gerianne Hardy, Student-PT) LTG: Pt will perform bed mobility with assistance level of: Independent with assistive device    Problem: RH Bed to Chair Transfers Goal: LTG Patient will perform bed/chair transfers w/assist (PT) Description: LTG: Patient will perform bed to chair transfers with assistance (PT). Flowsheets (Taken 11/17/2023 1450 by Gerianne Hardy, Student-PT) LTG: Pt will perform Bed  to Chair Transfers with  assistance level: Supervision/Verbal cueing

## 2023-11-17 NOTE — Plan of Care (Signed)
  Problem: Consults Goal: RH LIMB LOSS PATIENT EDUCATION Description: Description: See Patient Education module for eduction specifics. Outcome: Progressing Goal: Skin Care Protocol Initiated - if Braden Score 18 or less Description: If consults are not indicated, leave blank or document N/A Outcome: Progressing   Problem: RH BOWEL ELIMINATION Goal: RH STG MANAGE BOWEL WITH ASSISTANCE Description: STG Manage Bowel with toileting Assistance. Outcome: Progressing Goal: RH STG MANAGE BOWEL W/MEDICATION W/ASSISTANCE Description: STG Manage Bowel with Medication with mod I Assistance. Outcome: Progressing   Problem: RH BLADDER ELIMINATION Goal: RH STG MANAGE BLADDER WITH ASSISTANCE Description: STG Manage Bladder With  toileting Assistance Outcome: Progressing Goal: RH STG MANAGE BLADDER WITH MEDICATION WITH ASSISTANCE Description: STG Manage Bladder With Medication With mod I Assistance. Outcome: Progressing   Problem: RH SKIN INTEGRITY Goal: RH STG MAINTAIN SKIN INTEGRITY WITH ASSISTANCE Description: STG Maintain Skin Integrity With min Assistance. Outcome: Progressing Goal: RH STG ABLE TO PERFORM INCISION/WOUND CARE W/ASSISTANCE Description: STG Able To Perform Incision/Wound Care With min Assistance. Outcome: Progressing   Problem: RH SAFETY Goal: RH STG ADHERE TO SAFETY PRECAUTIONS W/ASSISTANCE/DEVICE Description: STG Adhere to Safety Precautions With cues Assistance/Device. Outcome: Progressing Goal: RH STG DECREASED RISK OF FALL WITH ASSISTANCE Description: STG Decreased Risk of Fall With cues Assistance. Outcome: Progressing   Problem: RH PAIN MANAGEMENT Goal: RH STG PAIN MANAGED AT OR BELOW PT'S PAIN GOAL Description: < 4 with prns Outcome: Progressing   Problem: RH KNOWLEDGE DEFICIT LIMB LOSS Goal: RH STG INCREASE KNOWLEDGE OF SELF CARE AFTER LIMB LOSS Description: Patient and wife will be able to manage care at discharge using educational resources for medication,  skin care, dietary ,modification independently Outcome: Progressing

## 2023-11-17 NOTE — H&P (Signed)
 Expand All Collapse All      Physical Medicine and Rehabilitation Admission H&P     No chief complaint on file. : HPI: Levi [name] is a 78 y.o. male with a PMHx of PAF on eliquis , HFpEF (EF 40-45% in 02/2023), HTN, HLD, COPD/agent orange exposure, BPH, GERD, anemia, and s/p recent LLE 2nd digit/RLE 5th digit ray amputation on 08/13/2023 and R foot exostectomy 10/16/23, who presented to the ED on 11/08/23 with presyncope/fall 2/2 sepsis 2/2 RLE cellulitis/osteomyelitis. In the ED, tachycardic and tachypneic, labs showing K 2.9, Cr 1.05, albumin 2.9, WBC 13.8, and lactate 2.4, CRP 16.5, procal 1.92, ESR 30. CTH NAICA. CT C-spine no acute abnl. CXR no acute cardiopulmonary findings. XR R foot showed moderate edema, advanced Charcot foot, and old fifth toe amputation and remote resection of the second through fourth proximal phalangeal heads. Pt received IV vancomycin , IV cefepime , and IV metronidazole  in the ED and was admitted. Vanc/cefepime /flagyl  were continued during admission, MRI R foot on 11/08/23 confirmed osteomyelitis and charcot degeneration.  BCx NGTD x2. He underwent R BKA with Dr. Harden on 11/14/23 and abx were discontinued 24hrs after amputation. He requires comprehensive rehab program due to functional decline after his BKA.      ROS     Past Medical History:  Diagnosis Date   Agent orange exposure 1970's    takes Imdur ,Diltiazem , and Atacand  daily   Agent orange exposure     ALLERGIC RHINITIS     Allergy       seasonal allergies   Anemia      iron  deficiency   Arthritis     Asthma      uses inhaler and nebulizers   Atrial fibrillation (HCC)     Bruises easily      d/t prednisone  daily   Cataract     Cellulitis 03/22/2020   Chest wall pain 09/21/2019    L scapula, reported 4/27 21/     CHF (congestive heart failure) (HCC)      dx-on meds to treat   Chronic bronchitis (HCC)      used to get it q yr; last time ?2008 (04/28/2012)   Clotting disorder (HCC) 2019    On  blood thinner when I bleed it is for a consideral amount of time. This is tegardless of size of wound.   COPD (chronic obstructive pulmonary disease) (HCC)      agent orange exposure   Degenerative disk disease      everywhere (04/28/2012)   Diverticulitis     Dysrhythmia      afib   Elevated uric acid in blood      takes Allopurinol  daily   Emphysema of lung (HCC)     Enlarged prostate      but not on any meds   GERD (gastroesophageal reflux disease)      takes Protonix  daily   H/O hiatal hernia     Headache     History of colonoscopy     History of pneumonia      a. 2010   Hyperlipidemia      takes Lipitor daily; pt. states he takes a preventive   Hypothyroidism     Joint pain     Joint swelling     Leukocytosis     MSSA bacteremia 03/19/2023   Nausea and vomiting 11/12/2022   Neuromuscular disorder (HCC)      bilat legs, and bilat arms   Neuropathy     Osteoporosis 2012  PAF (paroxysmal atrial fibrillation) (HCC)      a. Dx 04/2012, CHA2DS2VASc = 1 (age);  b. 04/2012 Echo: EF 40-50%, mild MR.   Personal history of colonic adenomas 02/09/2013   Pneumonia     Sepsis (HCC) 03/18/2023   Shortness of breath dyspnea     Thyroid  disease      no taking meds-caused dizziness             Past Surgical History:  Procedure Laterality Date   73 HOUR PH STUDY N/A 05/30/2021    Procedure: 24 HOUR PH STUDY;  Surgeon: San Sandor GAILS, DO;  Location: [facility];  Service: Gastroenterology;  Laterality: N/A;   AMPUTATION TOE Bilateral 08/13/2023    Procedure: LEFT SECOND TOE AMPUTATION AND RIGHT FIFTH TOE AMPUTATION;  Surgeon: Malvin Marsa FALCON, DPM;  Location: [facility];  Service: Orthopedics/Podiatry;  Laterality: Bilateral;   ANTERIOR CERVICAL DECOMP/DISCECTOMY FUSION N/A 07/21/2012    Procedure: ANTERIOR CERVICAL DECOMPRESSION/DISCECTOMY FUSION 2 LEVELS;  Surgeon: Victory Gens, MD;  Location: [facility];  Service: Neurosurgery;  Laterality: N/A;  C4-5 C5-6 Anterior  cervical decompression/diskectomy/fusion   ANTERIOR LAT LUMBAR FUSION N/A 04/07/2017    Procedure: Thoracic twelve-Lumbar one Anterolateral decompression/fusion;  Surgeon: Gens Victory, MD;  Location: [facility];  Service: Neurosurgery;  Laterality: N/A;   ARTHRODESIS FOOT WITH WEIL OSTEOTOMY Left 11/07/2022    Procedure: LEFT THIRD METATARSOPHALANGEAL CAPSULOTOMY, WEIL OSTEOTOMY;  Surgeon: Elsa Lonni SAUNDERS, MD;  Location: [facility] OR;  Service: Orthopedics;  Laterality: Left;  LENGTH OF SURGERY: 60 MINUTES   BACK SURGERY        x 3   BONE EXOSTOSIS EXCISION Right 10/16/2023    Procedure: EXCISION, EXOSTOSIS;  Surgeon: Janit Thresa HERO, DPM;  Location: [facility];  Service: Orthopedics/Podiatry;  Laterality: Right;   CARDIAC CATHETERIZATION   11/2008    Dr. Jeffrie - 20% calcified non flow limiting left main, 50% EF apical hypokinesis   CARPAL TUNNEL RELEASE Bilateral     CERVICAL DISC ARTHROPLASTY N/A 04/07/2017    Procedure: Cervical six-seven Disc arthroplasty;  Surgeon: Gens Victory, MD;  Location: [facility] OR;  Service: Neurosurgery;  Laterality: N/A;   CHEST TUBE INSERTION Left 04/11/2017    Procedure: CHEST TUBE INSERTION;  Surgeon: Fleeta Hanford Coy, MD;  Location: [facility] OR;  Service: Thoracic;  Laterality: Left;   CHOLECYSTECTOMY       COLONOSCOPY   2016    CG-MAC-miralax (good)servere TICS/TA x 2   ESOPHAGEAL MANOMETRY N/A 05/30/2021    Procedure: ESOPHAGEAL MANOMETRY (EM);  Surgeon: San Sandor GAILS, DO;  Location: [facility];  Service: Gastroenterology;  Laterality: N/A;  ph impedence   ESOPHAGOGASTRODUODENOSCOPY       EYE SURGERY Bilateral 2011    steroidal encapsulation (04/28/2012)   FOOT SURGERY Right      x 2   JOINT REPLACEMENT        Many surgeries including 5 joit replacements   KNEE ARTHROSCOPY Bilateral     LATERAL / POSTERIOR COMBINED FUSION LUMBAR SPINE   2012   LEFT ATRIAL APPENDAGE OCCLUSION N/A 02/22/2021    Procedure: LEFT ATRIAL APPENDAGE OCCLUSION;  Surgeon: Cindie Ole DASEN, MD;  [facility];  Service: Cardiovascular;  Laterality: N/A;   NEUROPLASTY / TRANSPOSITION ULNAR NERVE AT ELBOW Bilateral     POLYPECTOMY   2016    severe TICS/TA x 2   POSTERIOR CERVICAL LAMINECTOMY WITH MET- RX Right 06/11/2021    Procedure: Right Cervical six-seven  Laminectomy and foraminotomy with metrex;  Surgeon: Colon Shove, MD;  Location: [facility] OR;  Service: Neurosurgery;  Laterality: Right;   REFRACTIVE SURGERY Left 07/07/2023    laser surgery but still has floaters   REVERSE SHOULDER ARTHROPLASTY   04/28/2012    Procedure: REVERSE SHOULDER ARTHROPLASTY;  Surgeon: Eva Elsie Herring, MD;  [facility] OR;  Service: Orthopedics;  Laterality: Left;  Left shouder reverse total shoulder arthroplasty   SHOULDER SURGERY       SPINE SURGERY        I have had 7 spinal surgeries   TEE WITHOUT CARDIOVERSION N/A 02/22/2021    Procedure: TRANSESOPHAGEAL ECHOCARDIOGRAM (TEE);  Surgeon: Cindie Ole DASEN, MD;  Location: [facility] INVASIVE CV LAB;  Service: Cardiovascular;  Laterality: N/A;   TENDON REPAIR Right 08/07/2020    Procedure: RIGHT HAND LIGAMENT RECONSTRUCTION AND TENDON INTERPOSITION;  Surgeon: Yvone Rush, MD;  Location: [facility];  Service: Orthopedics;  Laterality: Right;   TONSILLECTOMY AND ADENOIDECTOMY   1954   TOTAL HIP ARTHROPLASTY Left 2011    left (04/28/2012)   TOTAL HIP ARTHROPLASTY Right 07/31/2015    Procedure: TOTAL HIP ARTHROPLASTY ANTERIOR APPROACH;  Surgeon: Rush Yvone, MD;  [facility];  Service: Orthopedics;  Laterality: Right;   TOTAL KNEE ARTHROPLASTY   01/10/2012    Procedure: TOTAL KNEE ARTHROPLASTY;  Surgeon: Rush LITTIE Yvone, MD;  Location: [facility];  Service: Orthopedics;;  left total knee arthroplasty   TOTAL KNEE ARTHROPLASTY Right 06/17/2014    Procedure: TOTAL KNEE ARTHROPLASTY;  Surgeon: Rush LITTIE Yvone, MD;  Location: [facility];  Service: Orthopedics;  Laterality: Right;   toupet fundolplication   11/01/2021    TRANSESOPHAGEAL ECHOCARDIOGRAM (CATH LAB) N/A 03/24/2023    Procedure: TRANSESOPHAGEAL ECHOCARDIOGRAM;  Surgeon: Mona Vinie BROCKS, MD;  [facility];  Service: Cardiovascular;  Laterality: N/A;             Family History  Problem Relation Age of Onset   Diabetes Mother     Heart disease Father          Died 79, MI   Cancer Father     Hyperlipidemia Father     Stroke Father     Prostate cancer Brother     Colon polyps Brother     Colon polyps Brother     Asthma Brother     COPD Brother     Colon cancer Brother          Dx age 45   Cancer Brother     Colon cancer Other     Esophageal cancer Neg Hx     Rectal cancer Neg Hx     Stomach cancer Neg Hx     Pancreatic cancer Neg Hx     Kidney disease Neg Hx     Liver disease Neg Hx     Neuropathy Neg Hx          Social History:  reports that he has never smoked. He has never used smokeless tobacco. He reports that he does not drink alcohol and does not use drugs. Allergies:  Allergies       Allergies  Allergen Reactions   Levaquin  [Levofloxacin ] Itching      Tolerated ciprofloxacin  in 2022.            Medications Prior to Admission  Medication Sig Dispense Refill   albuterol  (VENTOLIN  HFA) 108 (90 Base) MCG/ACT inhaler INHALE 2 PUFFS BY MOUTH EVERY 4 HOURS AS NEEDED FOR WHEEZE OR FOR SHORTNESS OF BREATH (  Patient taking differently: Inhale 1-2 puffs into the lungs every 4 (four) hours as needed.) 8.5 each 12   allopurinol  (ZYLOPRIM ) 100 MG tablet TAKE 1 TABLET (100 MG TOTAL) BY MOUTH IN THE MORNING 90 tablet 3   atorvastatin  (LIPITOR) 20 MG tablet TAKE 1 TABLET BY MOUTH EVERY DAY 90 tablet 0   ciprofloxacin  (CIPRO ) 500 MG tablet Take 500 mg by mouth 2 (two) times daily.       cyclobenzaprine  (FLEXERIL ) 10 MG tablet Take 1 tablet (10 mg total) by mouth 3 (three) times daily as needed for muscle spasms. 90 tablet 3   diclofenac  Sodium (VOLTAREN ) 1 % GEL Apply 4 g topically as needed (joint pain).        diltiazem  (CARDIZEM  CD) 180 MG 24 hr capsule Take 1 capsule (180 mg total) by mouth daily. 90 capsule 3   diltiazem  (CARDIZEM ) 30 MG tablet TAKE 1 TABLET (30 MG TOTAL) BY MOUTH AS NEEDED (HEART RATE OVER 120). 90 tablet 1   ELIQUIS  5 MG TABS tablet TAKE 1 TABLET BY MOUTH TWICE A DAY 180 tablet 1   ferrous gluconate  (FERGON) 324 MG tablet Take 1 tablet (324 mg total) by mouth daily. 90 tablet 2   furosemide  (LASIX ) 40 MG tablet Take 1 tablet (40 mg total) by mouth daily. (Patient taking differently: Take 40-80 mg by mouth daily as needed for edema.) 30 tablet 3   ipratropium-albuterol  (DUONEB) 0.5-2.5 (3) MG/3ML SOLN 1 NEBULE EVERY 6 HOURS AS NEEDED. **J45.3** (Patient taking differently: Take 3 mLs by nebulization every 6 (six) hours as needed.) 270 mL 4   isosorbide  mononitrate (IMDUR ) 30 MG 24 hr tablet TAKE 1 TABLET BY MOUTH EVERY DAY 90 tablet 0   meclizine  (ANTIVERT ) 25 MG tablet TAKE 1 TABLET BY MOUTH 3 TIMES DAILY AS NEEDED FOR DIZZINESS. 30 tablet 0   Olodaterol HCl (STRIVERDI RESPIMAT) 2.5 MCG/ACT AERS INHALE 2 PUFFS BY MOUTH INTO THE LUNGS DAILY (Patient taking differently: Inhale 2 puffs into the lungs 2 (two) times daily.) 4 g 11   ondansetron  (ZOFRAN -ODT) 8 MG disintegrating tablet Take 1 tablet (8 mg total) by mouth every 8 (eight) hours as needed for nausea. 20 tablet 3   oxyCODONE -acetaminophen  (PERCOCET) 5-325 MG tablet Take 1 tablet by mouth every 4 (four) hours as needed for severe pain (pain score 7-10). 30 tablet 0   pantoprazole  (PROTONIX ) 40 MG tablet TAKE 1 TABLET (40 MG TOTAL) BY MOUTH TWICE A DAY BEFORE MEALS 180 tablet 2   predniSONE  (DELTASONE ) 10 MG tablet Take 20 mg by mouth daily with breakfast.       pregabalin  (LYRICA ) 100 MG capsule Take 1 capsule (100 mg total) by mouth 2 (two) times daily. 60 capsule 2   tamsulosin  (FLOMAX ) 0.4 MG CAPS capsule Take 0.4 mg by mouth at bedtime.               Home Living Family/patient expects to be discharged to:: Private  residence Living Arrangements: Spouse/significant other Available Help at Discharge: Family, Available 24 hours/day Type of Home: House Home Access: Stairs to enter, Level entry, Other (comment) (has been living in the basement so has a level entry, has been there for x1 year) Entrance Stairs-Number of Steps: 3 Home Layout: Able to live on main level with bedroom/bathroom Bathroom Shower/Tub: Health visitor: Handicapped height Bathroom Accessibility: Yes Home Equipment: Shower seat, BSC/3in1, Agricultural consultant (2 wheels), Cane - single point, Rollator (4 wheels) Additional Comments: Pt lives with wife, daughter lives close  by, and other family who could help 24/7    Functional History: Prior Function Prior Level of Function : Needs assist, History of Falls (last six months), Driving Mobility Comments: using rollator for gait, walks some community ditances ADLs Comments: wife assisting with LB dressing, indep with showering. Wife cooks and cleans, also has a cleaner come in 2x/month  Functional Status:  Mobility: Bed Mobility Overal bed mobility: Needs Assistance Bed Mobility: Supine to Sit Supine to sit: Min assist Sit to supine: Mod assist General bed mobility comments: min A with HOB elevated for sitting up and scooting to EOB. Cues for technique. Ampushield in place. Transfers Overall transfer level: Needs assistance Equipment used: Rolling walker (2 wheels) Transfers: Sit to/from Stand, Bed to chair/wheelchair/BSC Sit to Stand: Max assist, +2 physical assistance, +2 safety/equipment Bed to/from chair/wheelchair/BSC transfer type:: Stand pivot Stand pivot transfers: Max assist, +2 physical assistance, +2 safety/equipment General transfer comment: max A +2  for boost to stand. Cues for technique and hand placement. Max A +2 for pivot to recliner, sequencing of RW and to balance pt. No buckling, unable to hop. heavy reliance on BUEs to pivot L  foot. Ambulation/Gait Ambulation/Gait assistance: Min assist, +2 safety/equipment Gait Distance (Feet): 5 Feet Assistive device: Rolling walker (2 wheels) Gait Pattern/deviations: Decreased step length - right, Decreased step length - left, Decreased stance time - right, Decreased weight shift to right, Trunk flexed, Wide base of support, Step-to pattern General Gait Details: +2 Min A to ambulate short distance to chair Gait velocity: decreased Gait velocity interpretation: <1.31 ft/sec, indicative of household Actuary mobility:  (Pt spouse and other family member shown how to remove chair arm and visual/verbal demo for slide board (vs squat pivot) transfer to [facility] while pt in bathroom for toileting. Not able to assess WC transfers with patient today due to pt >10 mins in toilet rm)  ADL: ADL Overall ADL's : Needs assistance/impaired Eating/Feeding: Independent Grooming: Set up, Sitting Upper Body Bathing: Minimal assistance, Sitting Lower Body Bathing: Maximal assistance, Sitting/lateral leans Upper Body Dressing : Minimal assistance, Sitting Lower Body Dressing: Maximal assistance, Sitting/lateral leans Lower Body Dressing Details (indicate cue type and reason): assist for shoes, relies on BUE support in standing Toilet Transfer: Maximal assistance, +2 for physical assistance, +2 for safety/equipment, BSC/3in1, Rolling walker (2 wheels) Toileting- Clothing Manipulation and Hygiene: Moderate assistance, Sitting/lateral lean Functional mobility during ADLs: Minimal assistance, Rolling walker (2 wheels) General ADL Comments: Pt requires help for LB ADLs at baseline, min A for UB dressing/bathing. Pt stands with max A x2 assist, increased effort for pivoting to recliner, will do well with Stedy  Cognition: Cognition Orientation Level: Oriented X4 Cognition Arousal: Alert Behavior During Therapy: [facility] for tasks assessed/performed  Physical Exam:     11/16/2023    2:36 PM 11/16/2023    2:22 PM 11/16/2023    2:00 PM  Vitals with BMI  Height  6' 2   Weight 198 lbs 14 oz    BMI 25.52    Systolic   130  Diastolic   73  Pulse   91     PE: Constitution: Appropriate appearance for age. No apparent distress  Resp: No respiratory distress. No accessory muscle usage. on RA and CTAB Cardio: Well perfused appearance. No peripheral edema. Abdomen: Nondistended. Nontender.   Psych: Appropriate mood and affect. Skin: BKA site with wound VAC, with black foam; well adhered, minimal output. Peripheral IV intact Left foot with dry, flaking skin throughout; soft, boggy  heels without apparent breakdown.  Blanchable areas of erythema on top of 1st and 5th toes. Left upper extremity skin tear  Neuro: AAOx4. No apparent cognitive deficits  DTRs: Reflexes were 2+ in bilateral patella, biceps, BR and triceps. Hoffmans: negative b/l Sensory exam: revealed normal sensation in all dermatomal regions in bilateral upper extremities and with reduced sensation to light touch in distal left lower extremity Motor exam: strength 5/5 throughout bilateral upper extremities, bilateral lower extremities, and with exception of right shoulder 4/5 Coordination: Fine motor coordination was normal.    MSK: Right lower extremity BKA, in extension brace and shrinker sock.  Well-formed, minimal edema, full range of motion in flexion and extension  Right shoulder limited in flexion, abduction to less than 100 degrees.  No TTP  Left foot intoeing with flattened arch; ankle dorsiflexion passive range of motion just to neutral.  Prior second toe amputation.     Lab Results Last 48 Hours        Results for orders placed or performed during the hospital encounter of 11/08/23 (from the past 48 hours)  Basic metabolic panel with GFR     Status: Abnormal    Collection Time: 11/15/23  7:44 AM  Result Value Ref Range    Sodium 134 (L) 135 - 145 mmol/L    Potassium 3.8 3.5 -  5.1 mmol/L    Chloride 105 98 - 111 mmol/L    CO2 21 (L) 22 - 32 mmol/L    Glucose, Bld 118 (H) 70 - 99 mg/dL      Comment: Glucose reference range applies only to samples taken after fasting for at least 8 hours.    BUN 15 8 - 23 mg/dL    Creatinine, Ser 9.45 (L) 0.61 - 1.24 mg/dL    Calcium  8.3 (L) 8.9 - 10.3 mg/dL    GFR, Estimated >39 >39 mL/min      Comment: (NOTE) Calculated using the CKD-EPI Creatinine Equation (2021)      Anion gap 8 5 - 15      Comment: Performed at [facility], [address] [place], [place] [address]  CBC     Status: Abnormal    Collection Time: 11/15/23  7:44 AM  Result Value Ref Range    WBC 11.4 (H) 4.0 - 10.5 K/uL    RBC 2.97 (L) 4.22 - 5.81 MIL/uL    Hemoglobin 9.0 (L) 13.0 - 17.0 g/dL    HCT 72.3 (L) 60.9 - 52.0 %    MCV 92.9 80.0 - 100.0 fL    MCH 30.3 26.0 - 34.0 pg    MCHC 32.6 30.0 - 36.0 g/dL    RDW 85.1 88.4 - 84.4 %    Platelets 191 150 - 400 K/uL    nRBC 0.0 0.0 - 0.2 %      Comment: Performed at [facility], [address] [place], [place] [address]  Basic metabolic panel with GFR     Status: Abnormal    Collection Time: 11/16/23  6:56 AM  Result Value Ref Range    Sodium 138 135 - 145 mmol/L    Potassium 3.0 (L) 3.5 - 5.1 mmol/L    Chloride 107 98 - 111 mmol/L    CO2 23 22 - 32 mmol/L    Glucose, Bld 79 70 - 99 mg/dL      Comment: Glucose reference range applies only to samples taken after fasting for at least 8 hours.    BUN 13  8 - 23 mg/dL    Creatinine, Ser 9.51 (L) 0.61 - 1.24 mg/dL    Calcium  8.1 (L) 8.9 - 10.3 mg/dL    GFR, Estimated >39 >39 mL/min      Comment: (NOTE) Calculated using the CKD-EPI Creatinine Equation (2021)      Anion gap 8 5 - 15      Comment: Performed at [facility], [address], [place], [place] [address]  CBC     Status: Abnormal    Collection Time: 11/16/23  6:56 AM  Result Value Ref Range    WBC 8.0 4.0 - 10.5 K/uL    RBC 3.05 (L) 4.22 - 5.81 MIL/uL    Hemoglobin 9.2  (L) 13.0 - 17.0 g/dL    HCT 72.0 (L) 60.9 - 52.0 %    MCV 91.5 80.0 - 100.0 fL    MCH 30.2 26.0 - 34.0 pg    MCHC 33.0 30.0 - 36.0 g/dL    RDW 84.8 88.4 - 84.4 %    Platelets 219 150 - 400 K/uL    nRBC 0.0 0.0 - 0.2 %      Comment: Performed at [facility], [address] [place], [place] [address]    *Note: Due to a large number of results and/or encounters for the requested time period, some results have not been displayed. A complete set of results can be found in Results Review.      Imaging Results (Last 48 hours)  No results found.         There were no vitals taken for this visit.   Medical Problem List and Plan: 1. Functional deficits secondary to R BKA d/t osteomyelitis 11/14/23 by Dr. Harden, hx of charcot joint             -patient may not shower until wound vac removed             -ELOS/Goals: 16-18 days              -f/up with Dr. Harden 1wk after d/c, continue prevena plus wound vac             - Stable to admit to inpatient rehab  2.  Antithrombotics: -DVT/anticoagulation:  Pharmaceutical: Eliquis  5mg  BID             -antiplatelet therapy: n/a 3. Pain Management/DDD/neuropathy: tylenol  and oxycodone  PRN, flexeril  PRN, voltaren  PRN, Lyrica  100mg  BID, Fioricet  PRN 4. Mood/Behavior/Sleep: No concerns             -antipsychotic agents: n/a 5. Neuropsych/cognition: This patient is capable of making decisions on his own behalf. 6. Skin/Wound Care: routine skin checks, continue wound vac R BKA, wound care LUE wound -Add Eucerin cream to left foot daily  7. Fluids/Electrolytes/Nutrition/hypokalemia/hyponatremia: routine I&O, routine labs, continue vitamins/supplements, continue Kdur 20mEq daily 8. PAF on Eliquis  and Diltiazem  180mg  daily 9. HFpEF (EF 40-45% 02/2023): resume home Lasix  40mg  daily, daily weights 10. HTN: Continue Diltiazem  as above, home Imdur  on hold, resume as appropriate 11. HLD: continue Lipitor 20mg  daily 12. COPD/agent orange exposure:  continue chronic Prednisone  20mg  daily, continue Brovana /Pulmicort  nebs, Mucinex  PRN, Duonebs PRN 13. BPH: continue Flomax  0.4mg  nightly 14. GERD: continue Protonix  40mg  BID, Maalox PRN 15. ?Gout/elevated uric acid: home med Allopurinol  100mg  qAM on hold, restart as appropriate.  16. Anemia, acute on chronic: Hgb 9-10 range during acute stay, monitor labs 17. Vitamin B12 deficiency: 1000mcg SQ weekly ordered during admission, f/up on transition to  oral per primary team 18. Constipation: LBM day of admission, continue Colace 100mg  BID and Miralax  daily    [address] PA-C 11/16/2023   I have examined the patient independently and edited the note for HPI, ROS, exam, assessment, and plan as appropriate. I am in agreement with the above recommendations.   Joesph JAYSON Likes, DO 11/17/2023

## 2023-11-17 NOTE — Progress Notes (Signed)
 PROGRESS NOTE   Subjective/Complaints: No new complaints or concerns this morning.  Reports pain is controlled.  Reports having bowel movement yesterday.   Objective:   No results found. Recent Labs    11/16/23 0656 11/17/23 0501  WBC 8.0 7.9  HGB 9.2* 9.8*  HCT 27.9* 30.8*  PLT 219 242   Recent Labs    11/16/23 0656 11/17/23 0501  NA 138 136  K 3.0* 3.2*  CL 107 104  CO2 23 27  GLUCOSE 79 85  BUN 13 12  CREATININE 0.48* 0.60*  CALCIUM  8.1* 8.1*    Intake/Output Summary (Last 24 hours) at 11/17/2023 1637 Last data filed at 11/17/2023 1027 Gross per 24 hour  Intake 836 ml  Output 800 ml  Net 36 ml        Physical Exam: Vital Signs Blood pressure 120/75, pulse 84, temperature (!) 97.5 F (36.4 C), resp. rate 17, height 6' 2 (1.88 m), weight 90.2 kg, SpO2 100%.   Constitution: No acute distress Resp: CTAB, nonlabored breathing on room air Cardio: RRR, no peripheral edema Abdomen: Nondistended. Nontender.  + Bowel sounds Psych: Appropriate mood and affect. Skin: BKA site with wound VAC, with black foam; well adhered, no significant output noted in canister  Left foot with dry, flaking skin throughout; soft, boggy heels without apparent breakdown.  Blanchable areas of erythema on top of 1st and 5th toes. Left upper extremity skin tear   Neuro: AAOx4. No apparent cognitive deficits  Sensory exam: revealed normal sensation in all dermatomal regions in bilateral upper extremities and with reduced sensation to light touch in distal left lower extremity Motor exam: strength 5/5 throughout bilateral upper extremities, bilateral lower extremities, and with exception of right shoulder 4/5    MSK: Right lower extremity BKA, in extension brace and shrinker sock.  Well-formed, minimal edema, full range of motion in flexion and extension   Right shoulder limited in flexion, abduction to less than 100 degrees.   No TTP   Left foot intoeing with flattened arch; ankle dorsiflexion passive range of motion just to neutral.  Prior second toe amputation.    Assessment/Plan: 1. Functional deficits which require 3+ hours per day of interdisciplinary therapy in a comprehensive inpatient rehab setting. Physiatrist is providing close team supervision and 24 hour management of active medical problems listed below. Physiatrist and rehab team continue to assess barriers to discharge/monitor patient progress toward functional and medical goals  Care Tool:  Bathing    Body parts bathed by patient: Right arm, Left arm, Chest, Abdomen, Right upper leg, Left upper leg, Face   Body parts bathed by helper: Front perineal area, Buttocks, Left lower leg Body parts n/a: Right lower leg   Bathing assist Assist Level: Moderate Assistance - Patient 50 - 74%     Upper Body Dressing/Undressing Upper body dressing   What is the patient wearing?: Pull over shirt    Upper body assist Assist Level: Moderate Assistance - Patient 50 - 74%    Lower Body Dressing/Undressing Lower body dressing      What is the patient wearing?: Pants, Incontinence brief     Lower body assist Assist for lower body dressing:  Total Assistance - Patient < 25%     Toileting Toileting    Toileting assist Assist for toileting: Total Assistance - Patient < 25%     Transfers Chair/bed transfer  Transfers assist  Chair/bed transfer activity did not occur: Safety/medical concerns  Chair/bed transfer assist level: Total Assistance - Patient < 25%     Locomotion Ambulation   Ambulation assist   Ambulation activity did not occur: Safety/medical concerns          Walk 10 feet activity   Assist  Walk 10 feet activity did not occur: Safety/medical concerns        Walk 50 feet activity   Assist Walk 50 feet with 2 turns activity did not occur: Safety/medical concerns         Walk 150 feet activity   Assist  Walk 150 feet activity did not occur: Safety/medical concerns         Walk 10 feet on uneven surface  activity   Assist Walk 10 feet on uneven surfaces activity did not occur: Safety/medical concerns         Wheelchair     Assist Is the patient using a wheelchair?: Yes Type of Wheelchair: Manual Wheelchair activity did not occur: Safety/medical concerns         Wheelchair 50 feet with 2 turns activity    Assist    Wheelchair 50 feet with 2 turns activity did not occur: Safety/medical concerns       Wheelchair 150 feet activity     Assist  Wheelchair 150 feet activity did not occur: Safety/medical concerns       Blood pressure 120/75, pulse 84, temperature (!) 97.5 F (36.4 C), resp. rate 17, height 6' 2 (1.88 m), weight 90.2 kg, SpO2 100%.  Medical Problem List and Plan: 1. Functional deficits secondary to R BKA d/t osteomyelitis 11/14/23 by Dr. Harden, hx of charcot joint             -patient may not shower until wound vac removed             -ELOS/Goals: 16-18 days              -f/up with Dr. Harden 1wk after d/c, continue prevena plus wound vac             - Continue CIR   2.  Antithrombotics: -DVT/anticoagulation:  Pharmaceutical: Eliquis  5mg  BID             -antiplatelet therapy: n/a 3. Pain Management/DDD/neuropathy: tylenol  and oxycodone  PRN, flexeril  PRN, voltaren  PRN, Lyrica  100mg  BID, Fioricet  PRN 4. Mood/Behavior/Sleep: No concerns             -antipsychotic agents: n/a 5. Neuropsych/cognition: This patient is capable of making decisions on his own behalf. 6. Skin/Wound Care: routine skin checks, continue wound vac R BKA, wound care LUE wound -Add Eucerin cream to left foot daily   7. Fluids/Electrolytes/Nutrition/hypokalemia/hyponatremia: routine I&O, routine labs, continue vitamins/supplements, continue Kdur 20mEq daily  -6/23 give additional 20 mEq of potassium today 8. PAF on Eliquis  and Diltiazem  180mg  daily 9. HFpEF (EF 40-45%  02/2023): resume home Lasix  40mg  daily, daily weights Filed Weights   11/16/23 1436  Weight: 90.2 kg    10. HTN: Continue Diltiazem  as above, home Imdur  on hold, resume as appropriate    11/17/2023    2:02 PM 11/17/2023    7:50 AM 11/17/2023    4:19 AM  Vitals with BMI  Systolic 120 111 851  Diastolic  75 72 69  Pulse 84 89 78    11. HLD: continue Lipitor 20mg  daily 12. COPD/agent orange exposure: continue chronic Prednisone  20mg  daily, continue Brovana /Pulmicort  nebs, Mucinex  PRN, Duonebs PRN 13. BPH: continue Flomax  0.4mg  nightly 14. GERD: continue Protonix  40mg  BID, Maalox PRN 15. ?Gout/elevated uric acid: home med Allopurinol  100mg  qAM on hold, restart as appropriate.  16. Anemia, acute on chronic: Hgb 9-10 range during acute stay, monitor labs  -6/23 stable continue to monitor 17. Vitamin B12 deficiency: 1000mcg SQ weekly ordered during admission, f/up on transition to oral per primary team 18. Constipation: LBM day of admission, continue Colace 100mg  BID and Miralax  daily  - 6/23 LBM yesterday continue to monitor    LOS: 1 days A FACE TO FACE EVALUATION WAS PERFORMED  Murray Collier 11/17/2023, 4:37 PM

## 2023-11-17 NOTE — Plan of Care (Signed)
  Problem: RH Balance Goal: LTG: Patient will maintain dynamic sitting balance (OT) Description: LTG:  Patient will maintain dynamic sitting balance with assistance during activities of daily living (OT) Flowsheets (Taken 11/17/2023 1218) LTG: Pt will maintain dynamic sitting balance during ADLs with: Independent with assistive device Goal: LTG Patient will maintain dynamic standing with ADLs (OT) Description: LTG:  Patient will maintain dynamic standing balance with assist during activities of daily living (OT)  Flowsheets (Taken 11/17/2023 1218) LTG: Pt will maintain dynamic standing balance during ADLs with: Independent with assistive device   Problem: Sit to Stand Goal: LTG:  Patient will perform sit to stand in prep for activites of daily living with assistance level (OT) Description: LTG:  Patient will perform sit to stand in prep for activites of daily living with assistance level (OT) Flowsheets (Taken 11/17/2023 1218) LTG: PT will perform sit to stand in prep for activites of daily living with assistance level: Contact Guard/Touching assist   Problem: RH Bathing Goal: LTG Patient will bathe all body parts with assist levels (OT) Description: LTG: Patient will bathe all body parts with assist levels (OT) Flowsheets (Taken 11/17/2023 1218) LTG: Pt will perform bathing with assistance level/cueing: Minimal Assistance - Patient > 75%   Problem: RH Toilet Transfers Goal: LTG Patient will perform toilet transfers w/assist (OT) Description: LTG: Patient will perform toilet transfers with assist, with/without cues using equipment (OT) Flowsheets (Taken 11/17/2023 1218) LTG: Pt will perform toilet transfers with assistance level of: Contact Guard/Touching assist   Problem: RH Toileting Goal: LTG Patient will perform toileting task (3/3 steps) with assistance level (OT) Description: LTG: Patient will perform toileting task (3/3 steps) with assistance level (OT)  Flowsheets (Taken 11/17/2023  1218) LTG: Pt will perform toileting task (3/3 steps) with assistance level: Minimal Assistance - Patient > 75%   Problem: RH Tub/Shower Transfers Goal: LTG Patient will perform tub/shower transfers w/assist (OT) Description: LTG: Patient will perform tub/shower transfers with assist, with/without cues using equipment (OT) Flowsheets (Taken 11/17/2023 1218) LTG: Pt will perform tub/shower stall transfers with assistance level of: Contact Guard/Touching assist

## 2023-11-18 DIAGNOSIS — Z89511 Acquired absence of right leg below knee: Secondary | ICD-10-CM | POA: Diagnosis not present

## 2023-11-18 DIAGNOSIS — I10 Essential (primary) hypertension: Secondary | ICD-10-CM | POA: Diagnosis not present

## 2023-11-18 DIAGNOSIS — M62838 Other muscle spasm: Secondary | ICD-10-CM

## 2023-11-18 DIAGNOSIS — E876 Hypokalemia: Secondary | ICD-10-CM | POA: Diagnosis not present

## 2023-11-18 DIAGNOSIS — K5900 Constipation, unspecified: Secondary | ICD-10-CM | POA: Diagnosis not present

## 2023-11-18 LAB — SURGICAL PATHOLOGY

## 2023-11-18 MED ORDER — ADULT MULTIVITAMIN W/MINERALS CH
1.0000 | ORAL_TABLET | Freq: Every day | ORAL | Status: DC
  Administered 2023-11-18 – 2023-12-05 (×18): 1 via ORAL
  Filled 2023-11-18 (×18): qty 1

## 2023-11-18 MED ORDER — JUVEN PO PACK
1.0000 | PACK | Freq: Two times a day (BID) | ORAL | Status: DC
  Administered 2023-11-18 – 2023-12-05 (×29): 1 via ORAL
  Filled 2023-11-18 (×29): qty 1

## 2023-11-18 NOTE — Progress Notes (Signed)
 Inpatient Rehabilitation Center Individual Statement of Services  Patient Name:  Levi [name]  Date:  11/18/2023  Welcome to the Inpatient Rehabilitation Center.  Our goal is to provide you with an individualized program based on your diagnosis and situation, designed to meet your specific needs.  With this comprehensive rehabilitation program, you will be expected to participate in at least 3 hours of rehabilitation therapies Monday-Friday, with modified therapy programming on the weekends.  Your rehabilitation program will include the following services:  Physical Therapy (PT), Occupational Therapy (OT), 24 hour per day rehabilitation nursing, Therapeutic Recreaction (TR), Neuropsychology, Care Coordinator, Rehabilitation Medicine, Nutrition Services, and Pharmacy Services  Weekly team conferences will be held on Wednesday to discuss your progress.  Your Inpatient Rehabilitation Care Coordinator will talk with you frequently to get your input and to update you on team discussions.  Team conferences with you and your family in attendance may also be held.  Expected length of stay: 16-18 days  Overall anticipated outcome: min-ADL's, mod-ambulation and supervision for transfers  Depending on your progress and recovery, your program may change. Your Inpatient Rehabilitation Care Coordinator will coordinate services and will keep you informed of any changes. Your Inpatient Rehabilitation Care Coordinator's name and contact numbers are listed  below.  The following services may also be recommended but are not provided by the Inpatient Rehabilitation Center:  Driving Evaluations Home Health Rehabiltiation Services Outpatient Rehabilitation Services    Arrangements will be made to provide these services after discharge if needed.  Arrangements include referral to agencies that provide these services.  Your insurance has been verified to be:  Medicare & BCBS Your primary doctor is:  Debby Petties  Pertinent information will be shared with your doctor and your insurance company.  Inpatient Rehabilitation Care Coordinator:  Rhoda Clement, KEN [phone] or ELIGAH BASQUES  Information discussed with and copy given to patient by: Clement Asberry MATSU, 11/18/2023, 10:17 AM

## 2023-11-18 NOTE — Progress Notes (Signed)
 Physical Therapy Session Note  Patient Details  Name: Levi [name] MRN: [id number] Date of Birth: [birth year]  Today's Date: 11/18/2023 PT Individual Time: [phone] PT Individual Time Calculation (min): 80, 42 min   Short Term Goals: Week 1:  PT Short Term Goal 1 (Week 1): Pt will be minA for sit to stand PT Short Term Goal 2 (Week 1): Pt will be minA for bed mobility PT Short Term Goal 3 (Week 1): Pt will start wc mobility PT Short Term Goal 4 (Week 1): Pt will be modA for stand pivot transfer  Skilled Therapeutic Interventions/Progress Updates:      Treatment Session 1  Pt seated in WC upon arrival. Pt agreeable to therapy. Pt denies any pain.   Pt requesting to use the bathroom. Pt attempted slide board transfer to [facility] however unsafe, therapist opted to discontinue.   Pt performed slide board transfer WC to hospital bed with mod progressing to min A, verbal cues provided for head hip ratio, verbal cues provided for sequencing and technique.   Pt incontinent of bowel and bladder. Pt performed rolling B for placement of bed pan. Pt continent of bowel. Pt performed rolling B with min A to L and mod A to R, with use of bed features, performed peri care and donning/doffing pants with total A. Pt performed supine to sit with min A.   Provided pt with 18x18 WC with elevating R LE leg rest versus amputee pad for improved pt positioning and comfort.   Pt performed slide board transfer to R with min A, with therapist placing and removing board.   Education provided how to [employer], and donn/doff arm rests for slide board transfer. Pt returned demonstration with [facility] assist intermittently.   Pt self propelled WC 50 feet with min A, verbal and tactile cues and HOH assist provided for technique, and sequencing especially with navigating turns.   Pt seated in WC at end of session with all needs within reach and chair alarm on.   Treatment Session 2   Pt received from  OT. Pt agreeable to therapy. Pt denies any pain.   Pt attempted sit to stand from [facility] level however discontined for safety.   Pt performed slide board transfer WC to mat table with therapist placing and removing board with CGA/supervision, verbal cues provided for head hip ratio.   Pt attemtped sit to stand from elevating mat table with +1 max A and RW however unable. Pt performed STS x 6 from elevated mat table with +2 A, verbal and tactile cues provided for technique. Pt attempted heel raises in standing however only able to achieve with knee flexion compensation.   Pt self propelled WC ~50 feet with min A, verbal and tactile uces provided for sequencing.   Pt seated in WC at end fo session with all needs within reach and chair alarm on.     Therapy Documentation Precautions:  Precautions Precautions: Fall Recall of Precautions/Restrictions: Intact Other Brace: RLE ampushield Restrictions Weight Bearing Restrictions Per Provider Order: Yes RLE Weight Bearing Per Provider Order: Non weight bearing Other Position/Activity Restrictions: no pillow under R knee  Therapy/Group: Individual Therapy  [facility] Doreene Orris, [place], DPT  11/18/2023, 7:53 AM

## 2023-11-18 NOTE — Progress Notes (Signed)
 Initial Nutrition Assessment  DOCUMENTATION CODES:   Not applicable  INTERVENTION:   Ensure Plus High Protein po BID, each supplement provides 350 kcal and 20 grams of protein. Juven 1 packet po BID, each packet provides 80 calories, 8 grams of carbohydrate, 2.5  grams of protein (collagen), 7 grams of L-arginine and 7 grams of L-glutamine; supplement contains CaHMB, Vitamins C, E, B12 and Zinc  to promote wound healing. MVI with minerals po once daily. Encourage intake of meals and supplements to support healing.  NUTRITION DIAGNOSIS:   Increased nutrient needs related to wound healing as evidenced by estimated needs.  GOAL:   Patient will meet greater than or equal to 90% of their needs  MONITOR:   PO intake, Supplement acceptance, Skin  REASON FOR ASSESSMENT:   Consult Wound healing  ASSESSMENT:   78 yo male admitted with functional decline s/p R BKA 11/14/23. PMH includes PAF, CHF, hypothyroidism, HLD, COPD, agent orange exposure, BPH, GERD, anemia.  Currently on a heart healthy diet; meal intakes 50-100% since admission to CIR.   Unable to speak with patient at this time or complete NFPE. Patient has increased nutrient need to support healing and would benefit from PO supplements. Will add Juven BID and MVI daily, continue Ensure Plus High Protein.   Labs 6/23 reviewed. K 3.2  Medications reviewed and include vitamin B12 injections 1,000 mcg weekly, colace, fergon, lasix , protonix , miralax , klor-con , prednisone , flomax .  Weight history reviewed. Usual weight 88-93 kg over the past year.  11/05/23: 95.3 kg 11/14/23: 93 kg 11/16/23: 90.2 kg Weight decrease d/t amputation, suspect weight will continue to decrease as swelling from amputation resolves.   NUTRITION - FOCUSED PHYSICAL EXAM:  Unable to complete at this time  Diet Order:   Diet Order             Diet Heart Room service appropriate? Yes; Fluid consistency: Thin  Diet effective now                    EDUCATION NEEDS:   Not appropriate for education at this time  Skin:  Skin Assessment: Skin Integrity Issues: Skin Integrity Issues:: Wound VAC Wound Vac: R BKA 11/14/23  Last BM:  6/24 type 6  Height:   Ht Readings from Last 1 Encounters:  11/16/23 6' 2 (1.88 m)    Weight:   Wt Readings from Last 1 Encounters:  11/16/23 90.2 kg    Ideal Body Weight:  80.8 kg (adjusted for BKA)  BMI:  Body mass index is 27.29 kg/m (adjusted for BKA)  Estimated Nutritional Needs:   Kcal:  2100-2300  Protein:  125-140 gm  Fluid:  2.1-2.3 L   Suzen HUNT RD, LDN, CNSC Contact via secure chat. If unavailable, use group chat RD Inpatient.

## 2023-11-18 NOTE — Progress Notes (Signed)
 Inpatient Rehabilitation Care Coordinator Assessment and Plan Patient Details  Name: Levi [name] MRN: [id number] Date of Birth: 06-01-1945  Today's Date: 11/18/2023  Hospital Problems: Principal Problem:   S/P BKA (below knee amputation) unilateral, right (HCC)  Past Medical History:  Past Medical History:  Diagnosis Date   Agent orange exposure 1970's   takes Imdur ,Diltiazem , and Atacand  daily   Agent orange exposure    ALLERGIC RHINITIS    Allergy     seasonal allergies   Anemia    iron  deficiency   Arthritis    Asthma    uses inhaler and nebulizers   Atrial fibrillation (HCC)    Bruises easily    d/t prednisone  daily   Cataract    Cellulitis 03/22/2020   Chest wall pain 09/21/2019   L scapula, reported 4/27 21/     CHF (congestive heart failure) (HCC)    dx-on meds to treat   Chronic bronchitis (HCC)    used to get it q yr; last time ?2008 (04/28/2012)   Clotting disorder (HCC) 2019   On blood thinner when I bleed it is for a consideral amount of time. This is tegardless of size of wound.   COPD (chronic obstructive pulmonary disease) (HCC)    agent orange exposure   Degenerative disk disease    everywhere (04/28/2012)   Diverticulitis    Dysrhythmia    afib   Elevated uric acid in blood    takes Allopurinol  daily   Emphysema of lung (HCC)    Enlarged prostate    but not on any meds   GERD (gastroesophageal reflux disease)    takes Protonix  daily   H/O hiatal hernia    Headache    History of colonoscopy    History of pneumonia    a. 2010   Hyperlipidemia    takes Lipitor daily; pt. states he takes a preventive   Hypothyroidism    Joint pain    Joint swelling    Leukocytosis    MSSA bacteremia 03/19/2023   Nausea and vomiting 11/12/2022   Neuromuscular disorder (HCC)    bilat legs, and bilat arms   Neuropathy    Osteoporosis 2012   PAF (paroxysmal atrial fibrillation) (HCC)    a. Dx 04/2012, CHA2DS2VASc = 1 (age);  b. 04/2012 Echo: EF 40-50%,  mild MR.   Personal history of colonic adenomas 02/09/2013   Pneumonia    Sepsis (HCC) 03/18/2023   Shortness of breath dyspnea    Thyroid  disease    no taking meds-caused dizziness   Past Surgical History:  Past Surgical History:  Procedure Laterality Date   25 HOUR PH STUDY N/A 05/30/2021   Procedure: 24 HOUR PH STUDY;  Surgeon: San Sandor GAILS, DO;  Location: [facility];  Service: Gastroenterology;  Laterality: N/A;   AMPUTATION Right 11/14/2023   Procedure: AMPUTATION BELOW KNEE;  Surgeon: Harden Jerona GAILS, MD;  Location: [facility] OR;  Service: Orthopedics;  Laterality: Right;   AMPUTATION TOE Bilateral 08/13/2023   Procedure: LEFT SECOND TOE AMPUTATION AND RIGHT FIFTH TOE AMPUTATION;  Surgeon: Malvin Marsa FALCON, DPM;  Location: [facility] OR;  Service: Orthopedics/Podiatry;  Laterality: Bilateral;   ANTERIOR CERVICAL DECOMP/DISCECTOMY FUSION N/A 07/21/2012   Procedure: ANTERIOR CERVICAL DECOMPRESSION/DISCECTOMY FUSION 2 LEVELS;  Surgeon: Victory Gens, MD;  Location: [facility];  Service: Neurosurgery;  Laterality: N/A;  C4-5 C5-6 Anterior cervical decompression/diskectomy/fusion   ANTERIOR LAT LUMBAR FUSION N/A 04/07/2017   Procedure: Thoracic twelve-Lumbar one Anterolateral decompression/fusion;  Surgeon: Gens Victory, MD;  Location: [facility] OR;  Service: Neurosurgery;  Laterality: N/A;   ARTHRODESIS FOOT WITH WEIL OSTEOTOMY Left 11/07/2022   Procedure: LEFT THIRD METATARSOPHALANGEAL CAPSULOTOMY, WEIL OSTEOTOMY;  Surgeon: Elsa Lonni SAUNDERS, MD;  Location: [facility] OR;  Service: Orthopedics;  Laterality: Left;  LENGTH OF SURGERY: 60 MINUTES   BACK SURGERY     x 3   BONE EXOSTOSIS EXCISION Right 10/16/2023   Procedure: EXCISION, EXOSTOSIS;  Surgeon: Janit Thresa HERO, DPM;  Location: [facility];  Service: Orthopedics/Podiatry;  Laterality: Right;   CARDIAC CATHETERIZATION  11/2008   Dr. Jeffrie - 20% calcified non flow limiting left main, 50% EF apical hypokinesis   CARPAL TUNNEL RELEASE Bilateral     CERVICAL DISC ARTHROPLASTY N/A 04/07/2017   Procedure: Cervical six-seven Disc arthroplasty;  Surgeon: Colon Shove, MD;  Location: [facility] OR;  Service: Neurosurgery;  Laterality: N/A;   CHEST TUBE INSERTION Left 04/11/2017   Procedure: CHEST TUBE INSERTION;  Surgeon: Fleeta Hanford Coy, MD;  Location: [facility] OR;  Service: Thoracic;  Laterality: Left;   CHOLECYSTECTOMY     COLONOSCOPY  2016   CG-MAC-miralax (good)servere TICS/TA x 2   ESOPHAGEAL MANOMETRY N/A 05/30/2021   Procedure: ESOPHAGEAL MANOMETRY (EM);  Surgeon: San Sandor GAILS, DO;  Location: [facility];  Service: Gastroenterology;  Laterality: N/A;  ph impedence   ESOPHAGOGASTRODUODENOSCOPY     EYE SURGERY Bilateral 2011   steroidal encapsulation (04/28/2012)   FOOT SURGERY Right    x 2   JOINT REPLACEMENT     Many surgeries including 5 joit replacements   KNEE ARTHROSCOPY Bilateral    LATERAL / POSTERIOR COMBINED FUSION LUMBAR SPINE  2012   LEFT ATRIAL APPENDAGE OCCLUSION N/A 02/22/2021   Procedure: LEFT ATRIAL APPENDAGE OCCLUSION;  Surgeon: Cindie Ole DASEN, MD;  [facility];  Service: Cardiovascular;  Laterality: N/A;   NEUROPLASTY / TRANSPOSITION ULNAR NERVE AT ELBOW Bilateral    POLYPECTOMY  2016   severe TICS/TA x 2   POSTERIOR CERVICAL LAMINECTOMY WITH MET- RX Right 06/11/2021   Procedure: Right Cervical six-seven  Laminectomy and foraminotomy with metrex;  Surgeon: Colon Shove, MD;  Location: [facility];  Service: Neurosurgery;  Laterality: Right;   REFRACTIVE SURGERY Left 07/07/2023   laser surgery but still has floaters   REVERSE SHOULDER ARTHROPLASTY  04/28/2012   Procedure: REVERSE SHOULDER ARTHROPLASTY;  Surgeon: Eva Elsie Herring, MD;  Location: [facility] OR;  Service: Orthopedics;  Laterality: Left;  Left shouder reverse total shoulder arthroplasty   SHOULDER SURGERY     SPINE SURGERY     I have had 7 spinal surgeries   TEE WITHOUT CARDIOVERSION N/A 02/22/2021   Procedure: TRANSESOPHAGEAL  ECHOCARDIOGRAM (TEE);  Surgeon: Cindie Ole DASEN, MD;  Location: [facility] INVASIVE CV LAB;  Service: Cardiovascular;  Laterality: N/A;   TENDON REPAIR Right 08/07/2020   Procedure: RIGHT HAND LIGAMENT RECONSTRUCTION AND TENDON INTERPOSITION;  Surgeon: Yvone Rush, MD;  Location: [facility];  Service: Orthopedics;  Laterality: Right;   TONSILLECTOMY AND ADENOIDECTOMY  1954   TOTAL HIP ARTHROPLASTY Left 2011   left (04/28/2012)   TOTAL HIP ARTHROPLASTY Right 07/31/2015   Procedure: TOTAL HIP ARTHROPLASTY ANTERIOR APPROACH;  Surgeon: Rush Yvone, MD;  Location: [facility] OR;  Service: Orthopedics;  Laterality: Right;   TOTAL KNEE ARTHROPLASTY  01/10/2012   Procedure: TOTAL KNEE ARTHROPLASTY;  Surgeon: Rush LITTIE Yvone, MD;  Location: [facility] OR;  Service: Orthopedics;;  left total knee arthroplasty   TOTAL KNEE ARTHROPLASTY Right 06/17/2014   Procedure: TOTAL KNEE ARTHROPLASTY;  Surgeon:  Norleen LITTIE Gavel, MD;  Location: [facility] OR;  Service: Orthopedics;  Laterality: Right;   toupet fundolplication  11/01/2021   TRANSESOPHAGEAL ECHOCARDIOGRAM (CATH LAB) N/A 03/24/2023   Procedure: TRANSESOPHAGEAL ECHOCARDIOGRAM;  Surgeon: Mona Vinie BROCKS, MD;  [facility]: [facility];  Service: Cardiovascular;  Laterality: N/A;   Social History:  reports that he has never smoked. He has never used smokeless tobacco. He reports that he does not drink alcohol and does not use drugs.  Family / Support Systems Marital Status: Married Patient Roles: Spouse, Parent Spouse/Significant Other: [name] [phone] Children: Sally-daughter [phone] Other Supports: Friends and neighbors Anticipated Caregiver: Wife Ability/Limitations of Caregiver: Wife is in good health and able to assist Caregiver Availability: 24/7 Family Dynamics: Close withj family and extended family he is doing well and his wife is present and a strong support.  Social History Preferred language: English Religion: Catholic Cultural Background:  NA Education: HS Health Literacy - How often do you need to have someone help you when you read instructions, pamphlets, or other written material from your doctor or pharmacy?: Never Writes: Yes Employment Status: Retired Marine scientist Issues: NA Guardian/Conservator: None-according to MD pt is capable of making his own decisions while here   Abuse/Neglect Abuse/Neglect Assessment Can Be Completed: Yes Physical Abuse: Denies Verbal Abuse: Denies Sexual Abuse: Denies Exploitation of patient/patient's resources: Denies Self-Neglect: Denies  Patient response to: Social Isolation - How often do you feel lonely or isolated from those around you?: Rarely  Emotional Status Pt's affect, behavior and adjustment status: Pt is motivated to recover and be as independent as he can be with his amputation. His wife is present and will be assisting at home. Recent Psychosocial Issues: other health issues were managed Psychiatric History: No history coping approapriately and verbalizes his concerns and feelngs. He may benefit from seeing neuro-psych while here Substance Abuse History: NA  Patient / Family Perceptions, Expectations & Goals Pt/Family understanding of illness & functional limitations: Pt and wife can explain his amputation and feels there was no other way. He does talk with the MD and feels understands his treatment plan moving forward. Premorbid pt/family roles/activities: husband, retiree, veteran, neighbor, etc Anticipated changes in roles/activities/participation: resume Pt/family expectations/goals: Pt states :  I hope to do well and get back to my independent level.   Wife states:  I know he will do all he can to get better.  Community [employer] Agencies: None Premorbid Home Care/DME Agencies: Other (Comment) (tub seat, riser, rollator, cane) Transportation available at discharge: wife Is the patient able to respond to transportation needs?: Yes In  the past 12 months, has lack of transportation kept you from medical appointments or from getting medications?: No In the past 12 months, has lack of transportation kept you from meetings, work, or from getting things needed for daily living?: No Resource referrals recommended: Neuropsychology  Discharge Planning Living Arrangements: Spouse/significant other Support Systems: Spouse/significant other, Children, Friends/neighbors Type of Residence: Private residence Insurance Resources: [employer] Financial Resources: Social Security Financial Screen Referred: No Living Expenses: Own Money Management: Patient, Spouse Does the patient have any problems obtaining your medications?: No Home Management: both Patient/Family Preliminary Plans: Return home with wife who is able to assist if needed. Will await evaluations and work on discharge needs. Care Coordinator Anticipated Follow Up Needs: HH/OP  Clinical Impression Pleasant gentleman who is motivated to dow ell and recover form his surgery. His wife is involved and will be assisting. Aware begin evaluated and goals being set  for stay here. Will update tomorrow after team conference  Raymonde Asberry MATSU 11/18/2023, 10:16 AM

## 2023-11-18 NOTE — Progress Notes (Signed)
 PROGRESS NOTE   Subjective/Complaints: Reports muscle spasms in his R leg this AM. Has not used PRN flexeril , advised to try if needed.   ROS: Patient denies fever, new vision changes, dizziness, nausea, vomiting, diarrhea,  shortness of breath or chest pain, headache, or mood change.  + muscle spasms    Objective:   No results found. Recent Labs    11/16/23 0656 11/17/23 0501  WBC 8.0 7.9  HGB 9.2* 9.8*  HCT 27.9* 30.8*  PLT 219 242   Recent Labs    11/16/23 0656 11/17/23 0501  NA 138 136  K 3.0* 3.2*  CL 107 104  CO2 23 27  GLUCOSE 79 85  BUN 13 12  CREATININE 0.48* 0.60*  CALCIUM  8.1* 8.1*    Intake/Output Summary (Last 24 hours) at 11/18/2023 1248 Last data filed at 11/18/2023 0746 Gross per 24 hour  Intake 470 ml  Output 200 ml  Net 270 ml        Physical Exam: Vital Signs Blood pressure 107/62, pulse 88, temperature 98 F (36.7 C), temperature source Oral, resp. rate 18, height 6' 2 (1.88 m), weight 90.2 kg, SpO2 97%.   Constitution: No acute distress, working in gym with PT Resp: CTAB, nonlabored breathing on room air Cardio: RRR, no peripheral edema Abdomen: Nondistended. Nontender.  + Bowel sounds Psych: Appropriate mood and affect. Skin: BKA site with wound VAC, with black foam; well adhered, no significant output noted in canister, limb protector in place  Left foot with dry, flaking skin throughout; soft, boggy heels without apparent breakdown.  Blanchable areas of erythema on top of 1st and 5th toes. Left upper extremity skin tear   Neuro: AAOx4. No apparent cognitive deficits  Sensory exam: revealed normal sensation in all dermatomal regions in bilateral upper extremities and with reduced sensation to light touch in distal left lower extremity Motor exam: strength 5/5 throughout bilateral upper extremities, bilateral lower extremities, and with exception of right shoulder 4/5     MSK: Right lower extremity BKA, in extension brace and shrinker sock.  Well-formed, minimal edema, full range of motion in flexion and extension   Right shoulder limited in flexion, abduction to less than 100 degrees.  No TTP   Left foot intoeing with flattened arch; ankle dorsiflexion passive range of motion just to neutral.  Prior second toe amputation.    Assessment/Plan: 1. Functional deficits which require 3+ hours per day of interdisciplinary therapy in a comprehensive inpatient rehab setting. Physiatrist is providing close team supervision and 24 hour management of active medical problems listed below. Physiatrist and rehab team continue to assess barriers to discharge/monitor patient progress toward functional and medical goals  Care Tool:  Bathing    Body parts bathed by patient: Right arm, Left arm, Chest, Abdomen, Right upper leg, Left upper leg, Face   Body parts bathed by helper: Front perineal area, Buttocks, Left lower leg Body parts n/a: Right lower leg   Bathing assist Assist Level: Moderate Assistance - Patient 50 - 74%     Upper Body Dressing/Undressing Upper body dressing   What is the patient wearing?: Pull over shirt    Upper body assist Assist Level:  Moderate Assistance - Patient 50 - 74%    Lower Body Dressing/Undressing Lower body dressing      What is the patient wearing?: Pants, Incontinence brief     Lower body assist Assist for lower body dressing: Total Assistance - Patient < 25%     Toileting Toileting    Toileting assist Assist for toileting: Total Assistance - Patient < 25%     Transfers Chair/bed transfer  Transfers assist  Chair/bed transfer activity did not occur: Safety/medical concerns  Chair/bed transfer assist level: Total Assistance - Patient < 25%     Locomotion Ambulation   Ambulation assist   Ambulation activity did not occur: Safety/medical concerns          Walk 10 feet activity   Assist  Walk 10  feet activity did not occur: Safety/medical concerns        Walk 50 feet activity   Assist Walk 50 feet with 2 turns activity did not occur: Safety/medical concerns         Walk 150 feet activity   Assist Walk 150 feet activity did not occur: Safety/medical concerns         Walk 10 feet on uneven surface  activity   Assist Walk 10 feet on uneven surfaces activity did not occur: Safety/medical concerns         Wheelchair     Assist Is the patient using a wheelchair?: Yes Type of Wheelchair: Manual Wheelchair activity did not occur: Safety/medical concerns         Wheelchair 50 feet with 2 turns activity    Assist    Wheelchair 50 feet with 2 turns activity did not occur: Safety/medical concerns       Wheelchair 150 feet activity     Assist  Wheelchair 150 feet activity did not occur: Safety/medical concerns       Blood pressure 107/62, pulse 88, temperature 98 F (36.7 C), temperature source Oral, resp. rate 18, height 6' 2 (1.88 m), weight 90.2 kg, SpO2 97%.  Medical Problem List and Plan: 1. Functional deficits secondary to R BKA d/t osteomyelitis 11/14/23 by Dr. Harden, hx of charcot joint             -patient may not shower until wound vac removed             -ELOS/Goals: 16-18 days              -f/up with Dr. Harden 1wk after d/c, continue prevena plus wound vac             - Continue CIR  -Team conference tomorrow    2.  Antithrombotics: -DVT/anticoagulation:  Pharmaceutical: Eliquis  5mg  BID             -antiplatelet therapy: n/a 3. Pain Management/DDD/neuropathy: tylenol  and oxycodone  PRN, flexeril  PRN, voltaren  PRN, Lyrica  100mg  BID, Fioricet  PRN  -6/24 Advised trying PRN flexeril  for muscle spasms 4. Mood/Behavior/Sleep: No concerns             -antipsychotic agents: n/a 5. Neuropsych/cognition: This patient is capable of making decisions on his own behalf. 6. Skin/Wound Care: routine skin checks, continue wound vac R BKA,  wound care LUE wound -Add Eucerin cream to left foot daily   7. Fluids/Electrolytes/Nutrition/hypokalemia/hyponatremia: routine I&O, routine labs, continue vitamins/supplements, continue Kdur 20mEq daily  -6/23 give additional 20 mEq of potassium today  Labs ordered for tomorrow AM 8. PAF on Eliquis  and Diltiazem  180mg  daily 9. HFpEF (EF 40-45% 02/2023): resume home  Lasix  40mg  daily, daily weights-ordered Filed Weights   11/16/23 1436  Weight: 90.2 kg    10. HTN: Continue Diltiazem  as above, home Imdur  on hold, resume as appropriate    11/18/2023    7:40 AM 11/18/2023    4:26 AM 11/17/2023    8:14 PM  Vitals with BMI  Systolic 107 137 864  Diastolic 62 76 78  Pulse 88 90 101  6/24, controlled, monitor   11. HLD: continue Lipitor 20mg  daily 12. COPD/agent orange exposure: continue chronic Prednisone  20mg  daily, continue Brovana /Pulmicort  nebs, Mucinex  PRN, Duonebs PRN 13. BPH: continue Flomax  0.4mg  nightly 14. GERD: continue Protonix  40mg  BID, Maalox PRN 15. ?Gout/elevated uric acid: home med Allopurinol  100mg  qAM on hold, restart as appropriate.  16. Anemia, acute on chronic: Hgb 9-10 range during acute stay, monitor labs  -6/23 stable continue to monitor  Recheck tomorrow 17. Vitamin B12 deficiency: 1000mcg SQ weekly ordered during admission, f/up on transition to oral per primary team 18. Constipation: LBM day of admission, continue Colace 100mg  BID and Miralax  daily  - 6/24 LBM today, continue to monitor     LOS: 2 days A FACE TO FACE EVALUATION WAS PERFORMED  Murray Collier 11/18/2023, 12:48 PM

## 2023-11-19 ENCOUNTER — Ambulatory Visit: Admitting: Sports Medicine

## 2023-11-19 ENCOUNTER — Ambulatory Visit: Admitting: Podiatry

## 2023-11-19 LAB — CBC
HCT: 32.8 % — ABNORMAL LOW (ref 39.0–52.0)
Hemoglobin: 10.3 g/dL — ABNORMAL LOW (ref 13.0–17.0)
MCH: 29.7 pg (ref 26.0–34.0)
MCHC: 31.4 g/dL (ref 30.0–36.0)
MCV: 94.5 fL (ref 80.0–100.0)
Platelets: 353 10*3/uL (ref 150–400)
RBC: 3.47 MIL/uL — ABNORMAL LOW (ref 4.22–5.81)
RDW: 15.4 % (ref 11.5–15.5)
WBC: 9.8 10*3/uL (ref 4.0–10.5)
nRBC: 0 % (ref 0.0–0.2)

## 2023-11-19 LAB — BASIC METABOLIC PANEL WITH GFR
Anion gap: 12 (ref 5–15)
BUN: 16 mg/dL (ref 8–23)
CO2: 25 mmol/L (ref 22–32)
Calcium: 9 mg/dL (ref 8.9–10.3)
Chloride: 103 mmol/L (ref 98–111)
Creatinine, Ser: 0.71 mg/dL (ref 0.61–1.24)
GFR, Estimated: >60 mL/min (ref >60)
Glucose, Bld: 99 mg/dL (ref 70–99)
Potassium: 3.7 mmol/L (ref 3.5–5.1)
Sodium: 140 mmol/L (ref 135–145)

## 2023-11-19 MED ORDER — FUROSEMIDE 20 MG PO TABS
20.0000 mg | ORAL_TABLET | Freq: Every day | ORAL | Status: DC
  Administered 2023-11-20 – 2023-12-05 (×14): 20 mg via ORAL
  Filled 2023-11-19 (×16): qty 1

## 2023-11-19 NOTE — Progress Notes (Signed)
 Patient ID: Levi [name], male   DOB: 10/17/1945, 78 y.o.   MRN: [id number]  Met with pt wife and daughter who was present to give them the team conference update goals of supervision-min level and target discharge date is 7/11. Pt hopes to be stronger and be doing more which he will if he continues to make progress. Wife can see he has progressed in the two days he has been here. Working on his trunk strength and transfers with slide board. Will continue to work on discharge needs and will know equipment next week that will be needed for home

## 2023-11-19 NOTE — Plan of Care (Signed)
  Problem: Consults Goal: RH LIMB LOSS PATIENT EDUCATION Description: Description: See Patient Education module for eduction specifics. Outcome: Progressing Goal: Skin Care Protocol Initiated - if Braden Score 18 or less Description: If consults are not indicated, leave blank or document N/A Outcome: Progressing   Problem: RH BOWEL ELIMINATION Goal: RH STG MANAGE BOWEL WITH ASSISTANCE Description: STG Manage Bowel with toileting Assistance. Outcome: Progressing Goal: RH STG MANAGE BOWEL W/MEDICATION W/ASSISTANCE Description: STG Manage Bowel with Medication with mod I Assistance. Outcome: Progressing   Problem: RH BLADDER ELIMINATION Goal: RH STG MANAGE BLADDER WITH ASSISTANCE Description: STG Manage Bladder With  toileting Assistance Outcome: Progressing Goal: RH STG MANAGE BLADDER WITH MEDICATION WITH ASSISTANCE Description: STG Manage Bladder With Medication With mod I Assistance. Outcome: Progressing   Problem: RH KNOWLEDGE DEFICIT LIMB LOSS Goal: RH STG INCREASE KNOWLEDGE OF SELF CARE AFTER LIMB LOSS Description: Patient and wife will be able to manage care at discharge using educational resources for medication, skin care, dietary ,modification independently Outcome: Progressing   Problem: RH PAIN MANAGEMENT Goal: RH STG PAIN MANAGED AT OR BELOW PT'S PAIN GOAL Description: < 4 with prns Outcome: Progressing   Problem: RH SAFETY Goal: RH STG ADHERE TO SAFETY PRECAUTIONS W/ASSISTANCE/DEVICE Description: STG Adhere to Safety Precautions With cues Assistance/Device. Outcome: Progressing Goal: RH STG DECREASED RISK OF FALL WITH ASSISTANCE Description: STG Decreased Risk of Fall With cues Assistance. Outcome: Progressing

## 2023-11-19 NOTE — Progress Notes (Signed)
 Occupational Therapy Session Note  Patient Details  Name: Levi [name] MRN: [id number] Date of Birth: 1945-07-24  Today's Date: 11/19/2023 OT Individual Time: 0900-0930 OT Individual Time Calculation (min): 30 min    Short Term Goals: Week 1:  OT Short Term Goal 1 (Week 1): Pt will transfer to the toilet/ BSC with mod A +1 OT Short Term Goal 2 (Week 1): Pt will be able to perform sit to stands in prep for ADL tasks with mod A +1 consistenly OT Short Term Goal 3 (Week 1): Pt will perform LB dressing with AEprn with mod A OT Short Term Goal 4 (Week 1): Pt will perform bed mobility in prep for ADL tasks without bed features with supervision  Skilled Therapeutic Interventions/Progress Updates:    1:1 Pt received in the w/c and taken to the dayroom. Goal to try to get up into standing with upright posture. Used the standing frame to assist pt with total A to into standing. Pt with difficulty extending his chest and maintain upright chest and required significant bilateral UE support. With first stand pt able to maintain standing for ~ 2 minutes. Second time pt only able to stand for ~ 30 -45 seconds due to fatigue. Pt did report that his right shoulder is injured PTA and needed a total Shoulder replacement. PT unable to fully flex shoulder to 90 degrees.   Pt self propelled with bilateral Ue and left foot from dayroom to RN station ~50 feet with supervision. Pt left sitting up in w/c with wife at his side.   Therapy Documentation Precautions:  Precautions Precautions: Fall Recall of Precautions/Restrictions: Intact Other Brace: RLE ampushield Restrictions Weight Bearing Restrictions Per Provider Order: Yes RLE Weight Bearing Per Provider Order: Non weight bearing Other Position/Activity Restrictions: no pillow under R knee  Pain:  No reports of pain  Therapy/Group: Individual Therapy  Claudene Nest [facility] 11/19/2023, 2:14 PM

## 2023-11-19 NOTE — Progress Notes (Signed)
 PROGRESS NOTE   Subjective/Complaints: Pain is well controlled. No new complaints or concerns this AM.  Reports phantom sensation but minimal phantom pain.  LBM this AM.   ROS: Patient denies fever, new vision changes, dizziness, nausea, vomiting, diarrhea,  shortness of breath or chest pain, headache, or mood change.  + muscle spasms-improved    Objective:   No results found. Recent Labs    11/17/23 0501 11/19/23 0932  WBC 7.9 9.8  HGB 9.8* 10.3*  HCT 30.8* 32.8*  PLT 242 353   Recent Labs    11/17/23 0501 11/19/23 0932  NA 136 140  K 3.2* 3.7  CL 104 103  CO2 27 25  GLUCOSE 85 99  BUN 12 16  CREATININE 0.60* 0.71  CALCIUM  8.1* 9.0    Intake/Output Summary (Last 24 hours) at 11/19/2023 1223 Last data filed at 11/19/2023 0700 Gross per 24 hour  Intake 690 ml  Output 825 ml  Net -135 ml        Physical Exam: Vital Signs Blood pressure 134/68, pulse 89, temperature 98 F (36.7 C), resp. rate 18, height 6' 2 (1.88 m), weight 87.9 kg, SpO2 94%.   Constitution: No acute distress, sitting in WC in his room Resp: CTAB, nonlabored breathing on room air Cardio: RRR Abdomen: Nondistended. Nontender.  + Bowel sounds Psych: Appropriate mood and affect. Pleasant Skin: BKA site with wound VAC, with black foam; well adhered, no significant output noted in canister, limb protector in place  Left foot with dry, flaking skin throughout; soft, boggy heels without apparent breakdown.  Blanchable areas of erythema on top of 1st and 5th toes. Left upper extremity skin tear   Neuro: AAOx4. No apparent cognitive deficits  Sensory exam: revealed normal sensation in all dermatomal regions in bilateral upper extremities and with reduced sensation to light touch in distal left lower extremity Motor exam: strength 5/5 throughout bilateral upper extremities, bilateral lower extremities, and with exception of right shoulder  4/5  Prior neuro assessment is c/w today's exam 11/19/2023.     MSK: Right lower extremity BKA, in extension brace and shrinker sock.  Well-formed, minimal edema, full range of motion in flexion and extension.   Right shoulder limited in flexion, abduction to less than 100 degrees.  No TTP   Left foot intoeing with flattened arch; ankle dorsiflexion passive range of motion just to neutral.  Prior second toe amputation.    Assessment/Plan: 1. Functional deficits which require 3+ hours per day of interdisciplinary therapy in a comprehensive inpatient rehab setting. Physiatrist is providing close team supervision and 24 hour management of active medical problems listed below. Physiatrist and rehab team continue to assess barriers to discharge/monitor patient progress toward functional and medical goals  Care Tool:  Bathing    Body parts bathed by patient: Right arm, Left arm, Chest, Abdomen, Right upper leg, Left upper leg, Face   Body parts bathed by helper: Front perineal area, Buttocks, Left lower leg Body parts n/a: Right lower leg   Bathing assist Assist Level: Moderate Assistance - Patient 50 - 74%     Upper Body Dressing/Undressing Upper body dressing   What is the patient wearing?: Pull  over shirt    Upper body assist Assist Level: Moderate Assistance - Patient 50 - 74%    Lower Body Dressing/Undressing Lower body dressing      What is the patient wearing?: Pants, Incontinence brief     Lower body assist Assist for lower body dressing: Total Assistance - Patient < 25%     Toileting Toileting    Toileting assist Assist for toileting: Total Assistance - Patient < 25%     Transfers Chair/bed transfer  Transfers assist  Chair/bed transfer activity did not occur: Safety/medical concerns  Chair/bed transfer assist level: Total Assistance - Patient < 25%     Locomotion Ambulation   Ambulation assist   Ambulation activity did not occur: Safety/medical  concerns          Walk 10 feet activity   Assist  Walk 10 feet activity did not occur: Safety/medical concerns        Walk 50 feet activity   Assist Walk 50 feet with 2 turns activity did not occur: Safety/medical concerns         Walk 150 feet activity   Assist Walk 150 feet activity did not occur: Safety/medical concerns         Walk 10 feet on uneven surface  activity   Assist Walk 10 feet on uneven surfaces activity did not occur: Safety/medical concerns         Wheelchair     Assist Is the patient using a wheelchair?: Yes Type of Wheelchair: Manual Wheelchair activity did not occur: Safety/medical concerns  Wheelchair assist level: Minimal Assistance - Patient > 75% Max wheelchair distance: 50    Wheelchair 50 feet with 2 turns activity    Assist    Wheelchair 50 feet with 2 turns activity did not occur: Safety/medical concerns   Assist Level: Minimal Assistance - Patient > 75%   Wheelchair 150 feet activity     Assist  Wheelchair 150 feet activity did not occur: Safety/medical concerns   Assist Level: Dependent - Patient 0%   Blood pressure 134/68, pulse 89, temperature 98 F (36.7 C), resp. rate 18, height 6' 2 (1.88 m), weight 87.9 kg, SpO2 94%.  Medical Problem List and Plan: 1. Functional deficits secondary to R BKA d/t osteomyelitis 11/14/23 by Dr. Harden, hx of charcot joint             -patient may not shower until wound vac removed             -ELOS/Goals: 16-18 days              -f/up with Dr. Harden 1wk after d/c, continue prevena plus wound vac             - Continue CIR  -Team conference today please see physician documentation under team conference tab, met with team  to discuss problems,progress, and goals. Formulized individual treatment plan based on medical history, underlying problem and comorbidities.     2.  Antithrombotics: -DVT/anticoagulation:  Pharmaceutical: Eliquis  5mg  BID             -antiplatelet  therapy: n/a 3. Pain Management/DDD/neuropathy: tylenol  and oxycodone  PRN, flexeril  PRN, voltaren  PRN, Lyrica  100mg  BID, Fioricet  PRN  -6/24 Advised trying PRN flexeril  for muscle spasms  -6/25 used flexeril  last night, pain is controlled, continue current regimen 4. Mood/Behavior/Sleep: No concerns             -antipsychotic agents: n/a 5. Neuropsych/cognition: This patient is capable of making decisions on his  own behalf. 6. Skin/Wound Care: routine skin checks, continue wound vac R BKA, wound care LUE wound -Add Eucerin cream to left foot daily   7. Fluids/Electrolytes/Nutrition/hypokalemia/hyponatremia: routine I&O, routine labs, continue vitamins/supplements, continue Kdur 20mEq daily  -6/23 give additional 20 mEq of potassium today  6/25 K+ up to 3.7, continue supplementation and monitor 8. PAF on Eliquis  and Diltiazem  180mg  daily 9. HFpEF (EF 40-45% 02/2023): resume home Lasix  40mg  daily, daily weights-ordered Filed Weights   11/16/23 1436 11/19/23 0422  Weight: 90.2 kg 87.9 kg  -6/25 wt a little lower, monitor tend, decrease lasix  to 20mg  daily, Cr was a little higher at 0.71/BUN 16, recheck friday1  10. HTN: Continue Diltiazem  as above, home Imdur  on hold, resume as appropriate    11/19/2023    4:22 AM 11/18/2023    7:48 PM 11/18/2023    7:00 PM  Vitals with BMI  Weight 193 lbs 13 oz    BMI 24.87    Systolic 134 127 875  Diastolic 68 59 64  Pulse 89 83 89  6/24-25, controlled, monitor   11. HLD: continue Lipitor 20mg  daily 12. COPD/agent orange exposure: continue chronic Prednisone  20mg  daily, continue Brovana /Pulmicort  nebs, Mucinex  PRN, Duonebs PRN 13. BPH: continue Flomax  0.4mg  nightly 14. GERD: continue Protonix  40mg  BID, Maalox PRN 15. ?Gout/elevated uric acid: home med Allopurinol  100mg  qAM on hold, restart as appropriate.  16. Anemia, acute on chronic: Hgb 9-10 range during acute stay, monitor labs  -6/23 stable continue to monitor  -6/25 HGB stable at 10.3 17.  Vitamin B12 deficiency: 1000mcg SQ weekly ordered during admission, f/up on transition to oral per primary team 18. Constipation: LBM day of admission, continue Colace 100mg  BID and Miralax  daily  - 6/25 LBM today, continue to monitor     LOS: 3 days A FACE TO FACE EVALUATION WAS PERFORMED  Murray Collier 11/19/2023, 12:23 PM

## 2023-11-19 NOTE — IPOC Note (Signed)
 Overall Plan of Care [facility]) Patient Details Name: Levi [name] MRN: [id number] DOB: [birth year]  Admitting Diagnosis: S/P BKA (below knee amputation) unilateral, right [facility])  Hospital Problems: Principal Problem:   S/P BKA (below knee amputation) unilateral, right (HCC)     Functional Problem List: Nursing Safety, Medication Management, Endurance, Pain, Bowel  PT Balance, Pain, Safety, Skin Integrity, Motor, Perception, Sensory, Endurance  OT Balance, Pain, Perception, Safety, Endurance, Motor, Skin Integrity, Sensory  SLP    TR         Basic ADL's: OT Bathing, Dressing, Toileting     Advanced  ADL's: OT       Transfers: PT Bed Mobility, Bed to Chair, Car, [employer], Civil Service fast streamer, Research scientist (life sciences): PT Ambulation, Psychologist, prison and probation services, Stairs     Additional Impairments: OT None  SLP        TR      Anticipated Outcomes Item Anticipated Outcome  Self Feeding n/a  Swallowing      Basic self-care  min A  Toileting  min A   Bathroom Transfers contact guard  Bowel/Bladder  manage bowel w mod I assist (Simultaneous filing. User may not have seen previous data.)  Transfers  CGA  Locomotion  minA ambulation / supervision with wc  Communication     Cognition     Pain  pain < 4 with Prns  Safety/Judgment  manage safety w cues   Therapy Plan: PT Intensity: Minimum of 1-2 x/day ,45 to 90 minutes PT Frequency: 5 out of 7 days PT Duration Estimated Length of Stay: 16-18 days OT Intensity: Minimum of 1-2 x/day, 45 to 90 minutes OT Frequency: 5 out of 7 days OT Duration/Estimated Length of Stay: ~ 3 weeks     Team Interventions: Nursing Interventions Patient/Family Education, Bowel Management, Pain Management, Medication Management, Disease Management/Prevention, Discharge Planning  PT interventions Ambulation/gait training, Discharge planning, Functional mobility training, Therapeutic Activities, Therapeutic Exercise, Skin care/wound management,  Neuromuscular re-education, Balance/vestibular training, Wheelchair propulsion/positioning, Pain management, DME/adaptive equipment instruction, Splinting/orthotics, UE/LE Strength taining/ROM, Stair training, UE/LE Coordination activities, Functional electrical stimulation, Patient/family education, Community reintegration, Psychosocial support  OT Interventions Warden/ranger, Discharge planning, Pain management, Self Care/advanced ADL retraining, Therapeutic Activities, UE/LE Coordination activities, Disease mangement/prevention, Functional mobility training, Patient/family education, Skin care/wound managment, Therapeutic Exercise, Community reintegration, DME/adaptive equipment instruction, Neuromuscular re-education, Psychosocial support, Splinting/orthotics, UE/LE Strength taining/ROM, Wheelchair propulsion/positioning  SLP Interventions    TR Interventions    SW/CM Interventions Discharge Planning, Psychosocial Support, Patient/Family Education   Barriers to Discharge MD  Medical stability  Nursing Decreased caregiver support, Weight bearing restrictions home 3 ste left rail w spouse; stays in basement level entry;Pt lives with wife, daughter lives close by, and other family who could help 24/7  PT Decreased caregiver support, Inaccessible home environment, Wound Care, Insurance for SNF coverage, Weight bearing restrictions wound vac after discharge  OT      SLP      SW       Team Discharge Planning: Destination: PT-Home ,OT- Home , SLP-  Projected Follow-up: PT-24 hour supervision/assistance, Home health PT, Outpatient PT, OT-  Home health OT, SLP-  Projected Equipment Needs: PT-To be determined, OT- To be determined, SLP-  Equipment Details: PT-urinal, OT-  Patient/family involved in discharge planning: PT- Patient,  OT-Patient, Family member/caregiver, SLP-   MD ELOS: 16-18 Medical Rehab Prognosis:  Excellent Assessment: The patient has been admitted for CIR  therapies with the diagnosis of R BKA d/t osteomyelitis 11/14/23  by Dr. Harden, hx of charcot joint . The team will be addressing functional mobility, strength, stamina, balance, safety, adaptive techniques and equipment, self-care, bowel and bladder mgt, patient and caregiver education. Goals have been set at min a/sup. Anticipated discharge destination is home.        See Team Conference Notes for weekly updates to the plan of care

## 2023-11-19 NOTE — Progress Notes (Signed)
 Occupational Therapy Session Note  Patient Details  Name: Levi [name] MRN: [id number] Date of Birth: 1945-11-16  Today's Date: 11/19/2023 OT Individual Time: 8669-8565 OT Individual Time Calculation (min): 64 min    Short Term Goals: Week 1:  OT Short Term Goal 1 (Week 1): Pt will transfer to the toilet/ BSC with mod A +1 OT Short Term Goal 2 (Week 1): Pt will be able to perform sit to stands in prep for ADL tasks with mod A +1 consistenly OT Short Term Goal 3 (Week 1): Pt will perform LB dressing with AEprn with mod A OT Short Term Goal 4 (Week 1): Pt will perform bed mobility in prep for ADL tasks without bed features with supervision  Skilled Therapeutic Interventions/Progress Updates:    Patient agreeable to participate in OT session. Reports 5/10 pain level in R shoulder and knee.   Patient participated in skilled OT session focusing on transfer training, core strengthening, education, and stanidng otlerance to increase ADL particiption .Patient completed wc mobility for 25 ft before complaining of UE fatigue. Patient completed slide board transfer to and from mat table, slide board placement total A to mat, and CGA from mat to chair. Slide board transfer CGA with mod verbal cues. Patient completed wc push ups to increase UE strengthening and efficiency in slide board transfers. Patient completed core strengthening via rotations with 3# weighted ball 2x10 reps. Patient completed sit to stand max A with mod verbal cues for foot placement 4x.Therapist facilitated increased safety with functional mobility and ADLs  in order to improve independence.     Therapy Documentation Precautions:  Precautions Precautions: Fall Recall of Precautions/Restrictions: Intact Other Brace: RLE ampushield Restrictions Weight Bearing Restrictions Per Provider Order: Yes RLE Weight Bearing Per Provider Order: Non weight bearing Other Position/Activity Restrictions: no pillow under R  knee  Therapy/Group: Individual Therapy  D'mariea L Felicie Kocher OTR/L  11/19/2023, 8:25 AM

## 2023-11-19 NOTE — Progress Notes (Signed)
 Physical Therapy Session Note  Patient Details  Name: Levi [name] MRN: [id number] Date of Birth: [birth year]  Today's Date: 11/19/2023 PT Individual Time: 9199-9155, 8997-8885 PT Individual Time Calculation (min): 44 min, 72 min   Short Term Goals: Week 1:  PT Short Term Goal 1 (Week 1): Pt will be minA for sit to stand PT Short Term Goal 2 (Week 1): Pt will be minA for bed mobility PT Short Term Goal 3 (Week 1): Pt will start wc mobility PT Short Term Goal 4 (Week 1): Pt will be modA for stand pivot transfer  Skilled Therapeutic Interventions/Progress Updates:      Treatment Session 1  Pt seated EOB upon arrival. Pt agreeable to therapy. Pt reports 7/10 neck pain, pt requesting pain medicine, notified nursing. Nurse present to administer medication. PT provided rest break and repositioning as needed.   Pt donned pants with max A with lateral trunk lean technique, verbal cues provided for technique. Pt donned ted hose and shoe with total A. Pt donned L LE shirnker with mod A, max verbal cues provided for tecnique.   Pt performed slide board transfer, bed to [facility] downhill with supervision/min A, verbal cues provided for head hip ration and LE/trunk positioning.   Pt performed slide board transfer WC to car with mod A for management of R LE.   Pt seated in WC at end of session with all needs within reach and chair alarm on.   Treatment Session 2   Pt seated in WC upon arrival. Pt agreeable to therapy. Pt denies any pain.   Pt performed mass practice of squat pivot transfer to downhill surface WC<>mat table, pt required +2 max A, max verbal cues provided for positioning, sequencing and anterior weight shift.   Pt performed lateral weight shifting x2 each direction along length of mat table with emphasis on R LE positioning, head hip ratio, and anterior weight shift.  Pt demonstrates improved carry over with technique with slide board transfer with supervision with max  questioning/instructional cues.   Pt performed sit to stand x6 from elevated mat table with +2 max progressing to mod A, verbal and tactile cues provided L LE terminal knee extension and trunk/hip extension.   Pt seated in WC at end of session with all needs witin reach and bed alarm on.     Therapy Documentation Precautions:  Precautions Precautions: Fall Recall of Precautions/Restrictions: Intact Other Brace: RLE ampushield Restrictions Weight Bearing Restrictions Per Provider Order: Yes RLE Weight Bearing Per Provider Order: Non weight bearing Other Position/Activity Restrictions: no pillow under R knee  Therapy/Group: Individual Therapy  [facility] Doreene Orris, [place], DPT  11/19/2023, 7:48 AM

## 2023-11-20 ENCOUNTER — Ambulatory Visit: Admitting: Sports Medicine

## 2023-11-20 DIAGNOSIS — I5032 Chronic diastolic (congestive) heart failure: Secondary | ICD-10-CM

## 2023-11-20 MED ORDER — POLYETHYLENE GLYCOL 3350 17 G PO PACK: 17.0000 g | PACK | Freq: Every day | ORAL | Status: DC | PRN

## 2023-11-20 NOTE — Patient Care Conference (Signed)
 Inpatient RehabilitationTeam Conference and Plan of Care Update Date: 11/19/2023   Time: 12:22 PM    Patient Name: Levi [name]      Medical Record Number: [id number]  Date of Birth: 12-13-1945 Sex: Male         Room/Bed: 4W03C/4W03C-01 Payor Info: Payor: MEDICARE / Plan: MEDICARE PART A AND B / Product Type: *No Product type* /    Admit Date/Time:  11/16/2023  1:15 PM  Primary Diagnosis:  S/P BKA (below knee amputation) unilateral, right [facility])  Hospital Problems: Principal Problem:   S/P BKA (below knee amputation) unilateral, right [facility])    Expected Discharge Date: Expected Discharge Date: 12/05/23  Team Members Present: Physician leading conference: Dr. Murray Collier Social Worker Present: Rhoda Clement, LCSW Nurse Present: Barnie Ronde, RN PT Present: Doreene Orris, PT OT Present: Delon Sharps, OT     Current Status/Progress Goal Weekly Team Focus  Bowel/Bladder   Continet of bowel and bladder, incontinent at times. LBM 6/24   Pt remain free from S/S of constipation   Toileting q 2 hours during day, q4 at hs. Bowel medications given per order    Swallow/Nutrition/ Hydration               ADL's   Mod SB transfers. Hx of R RTC issues with bilateral UE weakness. UB: min A, LB - Total A   Min A; HHOT   UB strengthening to increase sit to stands and functional transfers, on-going family training with wife when able, AE training during LB dressing/bathing    Mobility   bed mobility, min-mod A with use of bed features, sit to stand with +2 mod-max A from elevated mat table (fluctuates with fatigue) and RW, slide board transfer with min-max A, with therapist placing slide board   supervision WC level, min-mod A standing  D/C 7/11?, follow up HHPT, DME: pt has RW, pt needs WC, TBD, barriers to D/C: bowel and bladder incontinence, overall debility, R UE mobility    Communication                Safety/Cognition/ Behavioral Observations                Pain   Denies pain, c/o of muscle spasms.   Pt states pain level is <2 out of 10   Assess pain q shift and PRN. Give routine/prn medication per order. Pt took flexeril  at hs.    Skin   Incision to R BKA wound vac in place. multi skin tears to bilateral arms   Pt remain free from s/s of infection  assess wounds/incision q shift and PRN. provided care per orders.      Discharge Planning:  Home with wife who has been here and observed in therapies. Will order DME and follow up once have team's recommendations. Glad to be here on rehab   Team Discussion: Patient post right BKA with wound vac.and multiple skin tears to bilateral arms (prednisone ). Limited by right shoulder rotator injury, weakness and unable to off load weight to stand.  Functional level fluctuates with fatigue.  Patient on target to meet rehab goals: yes, currently needs min assist for upper body care but total assist for lower body care. Needs min - mod assist for bed mobility. Completes slide board transfers with min-max A, and the therapist needs to place the slide board.  Requires mod- max +2 for squat pivot transfers. Goals for discharge set for min assist overall.  *See Care Plan and progress  notes for long and short-term goals.   Revisions to Treatment Plan:  Mirror therapy   Teaching Needs: Safety, medications, skin care, residual limb/protection, wound care, transfers, toileting, etc.  Current Barriers to Discharge: Decreased caregiver support, Home enviroment access/layout, and Weight bearing restrictions  Possible Resolutions to Barriers: Family education DME:W/C HH follow up services SW to provide a list of private duty service facilities     Medical Summary Current Status: R BKA, hypokalemia, Constipation, HFpEF, anemia, COPD  Barriers to Discharge: Medical stability;Self-care education;Cardiac Complications;Electrolyte abnormality  Barriers to Discharge Comments: R BKA, hypokalemia, Constipation,  HFpEF, anemia, COPD Possible Resolutions to [employer] Focus: monitor BMP/CBC, monitor BP, monitor bowel function, prn flexeril , monitor weight   Continued Need for Acute Rehabilitation Level of Care: The patient requires daily medical management by a physician with specialized training in physical medicine and rehabilitation for the following reasons: Direction of a multidisciplinary physical rehabilitation program to maximize functional independence : Yes Medical management of patient stability for increased activity during participation in an intensive rehabilitation regime.: Yes Analysis of laboratory values and/or radiology reports with any subsequent need for medication adjustment and/or medical intervention. : Yes   I attest that I was present, lead the team conference, and concur with the assessment and plan of the team.   Fredericka Sober B 11/20/2023, 12:45 PM

## 2023-11-20 NOTE — Progress Notes (Signed)
 Physical Therapy Session Note  Patient Details  Name: Levi [name] MRN: [id number] Date of Birth: 1946/05/25  Today's Date: 11/20/2023 PT Individual Time: 0915-1028 PT Individual Time Calculation (min): 73 min   Short Term Goals: Week 1:  PT Short Term Goal 1 (Week 1): Pt will be minA for sit to stand PT Short Term Goal 2 (Week 1): Pt will be minA for bed mobility PT Short Term Goal 3 (Week 1): Pt will start wc mobility PT Short Term Goal 4 (Week 1): Pt will be modA for stand pivot transfer  Skilled Therapeutic Interventions/Progress Updates:      Pt supine in ebd upon arrival. Pt agreeable to therapy. Pt reports neck pain, not quantified, requesting pain medicine.   Therapist changed foam pad on pt heel 2/2 to it peeling off. Followed up regarding pt wearing L LE PRAFO at night. Pt reports he has been wearing it. Education provided regarding purpose of L LE PRAFO. Education provided on techniques to reduce unnecessary shear/pressure forces on L LE. Education provided regarding importance of performing daily skin inspection B to assess for pressure injury/infection. Education provided on recognizing signs and symptoms of infection. Pt and wife verbalized understanding and agreeable.   Pt continent of bladder with use of urinal. Education provided for how to remove urinal to prevent spillage per pt request.   Donning ted hose with total A. Pt donned pants with min A with lateral trunk lean technique and CGA/min A for seated balance, verbal cues provided for lateral trunk lean technique for safety as pt demos tendency for strong posterior lean.   Supine to sit with bed flat, with use of bed rails, and CGA with increased time.   Donned R LE leg protector with mod A while seated EOB, verbal cues provided for proper positioning sequencin/technique.   Pt performed bed to [facility] slide board transfer (slightly uphill) with max A for initial incline and then CGA once board began to level out. Max  verbal cues provided for postiioning and head/hip ratio.   WC mobility room to day room with B UE, L LE and supervision, intermittent min A with navigating turns, pt demos L lateral trunk lean likely as compensation for  R UE strength/ROM deficits.   Pt seated in WC with all needs within reach and bed alarm on.    Therapy Documentation Precautions:  Precautions Precautions: Fall Recall of Precautions/Restrictions: Intact Other Brace: RLE ampushield Restrictions Weight Bearing Restrictions Per Provider Order: Yes RLE Weight Bearing Per Provider Order: Non weight bearing Other Position/Activity Restrictions: no pillow under R knee   Therapy/Group: Individual Therapy  [facility] Doreene Orris, [place], DPT  11/20/2023, 9:38 AM

## 2023-11-20 NOTE — Progress Notes (Signed)
 Occupational Therapy Session Note  Patient Details  Name: Levi [name] MRN: [id number] Date of Birth: [birth year]  Session 1: Today's Date: 11/20/2023 OT Individual Time: 8899-8783 OT Individual Time Calculation (min): 76 min   Session 2: Today's Date: 11/20/2023 OT Individual Time: 1420-1520 OT Individual Time Calculation (min): 60 min    Short Term Goals: Week 1:  OT Short Term Goal 1 (Week 1): Pt will transfer to the toilet/ BSC with mod A +1 OT Short Term Goal 2 (Week 1): Pt will be able to perform sit to stands in prep for ADL tasks with mod A +1 consistenly OT Short Term Goal 3 (Week 1): Pt will perform LB dressing with AEprn with mod A OT Short Term Goal 4 (Week 1): Pt will perform bed mobility in prep for ADL tasks without bed features with supervision  Session 1: Skilled Therapeutic Interventions/Progress Updates:    Patient agreeable to participate in OT session. Reports 3/10 pain level.   Patient participated in skilled OT session focusing on ADL performance and education of family and patient. Patient completed ADL perfomance with UB, lB bathing requiring mod A. Patient completed LB dressing total A, mod A footwear, min A UB dressing. Patient able to complete slide board transfer training to multiple surfaces, bed to wc, wc to bsc, bsc to wc with min A and moderate verbal cues. Therapist facilitated and educated on proper positioning, maximizing independence, and increasing efficiency in slide board transfers.  Patient required increased time due to need for bowel movements related to prior constipation. Required max A for thoroughness of peri hygiene for bowel movement.  Session 2: Skilled Therapeutic Interventions/Progress Updates:   Patient agreeable to participate in OT session. Reports 6/10 pain level in R shoulder .   Patient participated in skilled OT session focusing on UE strengthening, core strengthening, dynamic balance and slide board transfers . Therapist  facilitated increased postural stability, balance,safety awareness to increase functional abilities. Patient completed UE strengthening in all planes to increase ability to slide board safely from all surfaces with increased efficiency and glide. Utilized yellow and red theraband for UE strengthening due to muscular imbalance between R side and L side due to painful condition. Patient reported increased fatigue today from earlier sessions.  Completed slide board transfer to bed with mod A for placement and CGA for transfer. Mod A required for lateral scooting on bed to increase UE strengthening and coordination of movements.   Therapy Documentation Precautions:  Precautions Precautions: Fall Recall of Precautions/Restrictions: Intact Other Brace: RLE ampushield Restrictions Weight Bearing Restrictions Per Provider Order: Yes RLE Weight Bearing Per Provider Order: Non weight bearing Other Position/Activity Restrictions: no pillow under R knee   Therapy/Group: Individual Therapy  D'mariea L Denym Christenberry OTR/L  11/20/2023, 7:43 AM

## 2023-11-20 NOTE — Progress Notes (Signed)
 PROGRESS NOTE   Subjective/Complaints: Patient would like stool softener laxative to be discontinued, having regular bowel movements.  No additional concerns or complaints.  ROS: Patient denies fever, new vision changes, dizziness, nausea, vomiting, shortness of breath or chest pain, headache, or mood change.  + muscle spasms-improved + loose BMs   Objective:   No results found. Recent Labs    11/19/23 0932  WBC 9.8  HGB 10.3*  HCT 32.8*  PLT 353   Recent Labs    11/19/23 0932  NA 140  K 3.7  CL 103  CO2 25  GLUCOSE 99  BUN 16  CREATININE 0.71  CALCIUM  9.0    Intake/Output Summary (Last 24 hours) at 11/20/2023 1511 Last data filed at 11/20/2023 1213 Gross per 24 hour  Intake 833 ml  Output 625 ml  Net 208 ml        Physical Exam: Vital Signs Blood pressure 133/71, pulse 80, temperature 98 F (36.7 C), temperature source Oral, resp. rate 16, height 6' 2 (1.88 m), weight 90.2 kg, SpO2 95%.   Constitution: No acute distress, working with therapy in the gym Resp: CTAB, nonlabored breathing on room air Cardio: RRR Abdomen: Nondistended. Nontender.  + Bowel sounds Psych: Appropriate mood and affect. Pleasant Skin: BKA site with wound VAC, with black foam; well adhered, no significant output noted in canister, limb protector in place  Left foot with dry, flaking skin throughout; soft, boggy heels without apparent breakdown.  Blanchable areas of erythema on top of 1st and 5th toes. Left upper extremity skin tear   Neuro: AAOx4. No apparent cognitive deficits  Sensory exam: revealed normal sensation in all dermatomal regions in bilateral upper extremities and with reduced sensation to light touch in distal left lower extremity Motor exam: strength 5/5 throughout bilateral upper extremities, bilateral lower extremities, and with exception of right shoulder 4/5  Prior neuro assessment is c/w today's exam  11/20/2023.     MSK: Right lower extremity BKA, in extension brace and shrinker sock.  Well-formed, minimal edema, full range of motion in flexion and extension.   Right shoulder limited in flexion, abduction to less than 100 degrees.  No TTP   Left foot intoeing with flattened arch; ankle dorsiflexion passive range of motion just to neutral.  Prior second toe amputation.    Assessment/Plan: 1. Functional deficits which require 3+ hours per day of interdisciplinary therapy in a comprehensive inpatient rehab setting. Physiatrist is providing close team supervision and 24 hour management of active medical problems listed below. Physiatrist and rehab team continue to assess barriers to discharge/monitor patient progress toward functional and medical goals  Care Tool:  Bathing    Body parts bathed by patient: Right arm, Left arm, Chest, Abdomen, Right upper leg, Left upper leg, Face, Front perineal area   Body parts bathed by helper: Buttocks, Right upper leg Body parts n/a: Right lower leg   Bathing assist Assist Level: Moderate Assistance - Patient 50 - 74%     Upper Body Dressing/Undressing Upper body dressing   What is the patient wearing?: Pull over shirt    Upper body assist Assist Level: Minimal Assistance - Patient > 75%  Lower Body Dressing/Undressing Lower body dressing      What is the patient wearing?: Pants, Incontinence brief     Lower body assist Assist for lower body dressing: Total Assistance - Patient < 25%     Toileting Toileting    Toileting assist Assist for toileting: Total Assistance - Patient < 25%     Transfers Chair/bed transfer  Transfers assist  Chair/bed transfer activity did not occur: Safety/medical concerns  Chair/bed transfer assist level: Total Assistance - Patient < 25%     Locomotion Ambulation   Ambulation assist   Ambulation activity did not occur: Safety/medical concerns          Walk 10 feet  activity   Assist  Walk 10 feet activity did not occur: Safety/medical concerns        Walk 50 feet activity   Assist Walk 50 feet with 2 turns activity did not occur: Safety/medical concerns         Walk 150 feet activity   Assist Walk 150 feet activity did not occur: Safety/medical concerns         Walk 10 feet on uneven surface  activity   Assist Walk 10 feet on uneven surfaces activity did not occur: Safety/medical concerns         Wheelchair     Assist Is the patient using a wheelchair?: Yes Type of Wheelchair: Manual Wheelchair activity did not occur: Safety/medical concerns  Wheelchair assist level: Minimal Assistance - Patient > 75% Max wheelchair distance: 50    Wheelchair 50 feet with 2 turns activity    Assist    Wheelchair 50 feet with 2 turns activity did not occur: Safety/medical concerns   Assist Level: Minimal Assistance - Patient > 75%   Wheelchair 150 feet activity     Assist  Wheelchair 150 feet activity did not occur: Safety/medical concerns   Assist Level: Dependent - Patient 0%   Blood pressure 133/71, pulse 80, temperature 98 F (36.7 C), temperature source Oral, resp. rate 16, height 6' 2 (1.88 m), weight 90.2 kg, SpO2 95%.  Medical Problem List and Plan: 1. Functional deficits secondary to R BKA d/t osteomyelitis 11/14/23 by Dr. Harden, hx of charcot joint             -patient may not shower until wound vac removed             -ELOS/Goals: 16-18 days              -f/up with Dr. Harden 1wk after d/c, continue prevena plus wound vac             - Continue CIR  -Expected discharge 7/11    2.  Antithrombotics: -DVT/anticoagulation:  Pharmaceutical: Eliquis  5mg  BID             -antiplatelet therapy: n/a 3. Pain Management/DDD/neuropathy: tylenol  and oxycodone  PRN, flexeril  PRN, voltaren  PRN, Lyrica  100mg  BID, Fioricet  PRN  -6/24 Advised trying PRN flexeril  for muscle spasms  -6/25 used flexeril  last night, pain  is controlled, continue current regimen 4. Mood/Behavior/Sleep: No concerns             -antipsychotic agents: n/a 5. Neuropsych/cognition: This patient is capable of making decisions on his own behalf. 6. Skin/Wound Care: routine skin checks, continue wound vac R BKA, wound care LUE wound -Add Eucerin cream to left foot daily   7. Fluids/Electrolytes/Nutrition/hypokalemia/hyponatremia: routine I&O, routine labs, continue vitamins/supplements, continue Kdur 20mEq daily  -6/23 give additional 20 mEq of potassium today  6/25 K+ up to 3.7, continue supplementation and monitor  Recheck tomorrow 8. PAF on Eliquis  and Diltiazem  180mg  daily 9. HFpEF (EF 40-45% 02/2023): resume home Lasix  40mg  daily, daily weights-ordered Filed Weights   11/16/23 1436 11/19/23 0422 11/20/23 0600  Weight: 90.2 kg 87.9 kg 90.2 kg  -6/25 wt a little lower, monitor tend, decrease lasix  to 20mg  daily, Cr was a little higher at 0.71/BUN 16, recheck friday -6/26 no signs of fluid overload, continue current regimen  10. HTN: Continue Diltiazem  as above, home Imdur  on hold, resume as appropriate    11/20/2023    6:00 AM 11/19/2023    7:49 PM 11/19/2023    3:50 PM  Vitals with BMI  Weight 198 lbs 14 oz    BMI 25.52    Systolic 133 129 888  Diastolic 71 63 72  Pulse 80 85 89  6/24-26, controlled, monitor   11. HLD: continue Lipitor 20mg  daily 12. COPD/agent orange exposure: continue chronic Prednisone  20mg  daily, continue Brovana /Pulmicort  nebs, Mucinex  PRN, Duonebs PRN 13. BPH: continue Flomax  0.4mg  nightly 14. GERD: continue Protonix  40mg  BID, Maalox PRN 15. ?Gout/elevated uric acid: home med Allopurinol  100mg  qAM on hold, restart as appropriate.  16. Anemia, acute on chronic: Hgb 9-10 range during acute stay, monitor labs  -6/23 stable continue to monitor  -6/25 HGB stable at 10.3 17. Vitamin B12 deficiency: 1000mcg SQ weekly ordered during admission, f/up on transition to oral per primary team 18.  Constipation: LBM day of admission, continue Colace 100mg  BID and Miralax  daily  - 6/25 LBM today, continue to monitor   -6/26 discontinue Colace and change MiraLAX  to as needed    LOS: 4 days A FACE TO FACE EVALUATION WAS PERFORMED  Murray Collier 11/20/2023, 3:11 PM

## 2023-11-21 LAB — BASIC METABOLIC PANEL WITH GFR
Anion gap: 8 (ref 5–15)
BUN: 20 mg/dL (ref 8–23)
CO2: 26 mmol/L (ref 22–32)
Calcium: 8.6 mg/dL — ABNORMAL LOW (ref 8.9–10.3)
Chloride: 104 mmol/L (ref 98–111)
Creatinine, Ser: 0.64 mg/dL (ref 0.61–1.24)
GFR, Estimated: >60 mL/min (ref >60)
Glucose, Bld: 73 mg/dL (ref 70–99)
Potassium: 3.4 mmol/L — ABNORMAL LOW (ref 3.5–5.1)
Sodium: 138 mmol/L (ref 135–145)

## 2023-11-21 MED ORDER — POTASSIUM CHLORIDE CRYS ER 20 MEQ PO TBCR: 20.0000 meq | EXTENDED_RELEASE_TABLET | Freq: Once | ORAL | Status: DC

## 2023-11-21 MED ORDER — POTASSIUM CHLORIDE CRYS ER 20 MEQ PO TBCR
20.0000 meq | EXTENDED_RELEASE_TABLET | Freq: Once | ORAL | Status: AC
  Administered 2023-11-21: 20 meq via ORAL
  Filled 2023-11-21: qty 1

## 2023-11-21 NOTE — Progress Notes (Signed)
 Physical Therapy Session Note  Patient Details  Name: Levi [name] MRN: [id number] Date of Birth: 04-04-1946  Today's Date: 11/21/2023 PT Individual Time: 1115-1200 PT Individual Time Calculation (min): 45 min   Short Term Goals: Week 1:  PT Short Term Goal 1 (Week 1): Pt will be minA for sit to stand PT Short Term Goal 2 (Week 1): Pt will be minA for bed mobility PT Short Term Goal 3 (Week 1): Pt will start wc mobility PT Short Term Goal 4 (Week 1): Pt will be modA for stand pivot transfer  Skilled Therapeutic Interventions/Progress Updates:    Patient seated in wc with wife on entrance to room. Patient alert and agreeable to PT session.   1st Session Patient reported that he had been working on slide board transfers with OT today so we decided to focus on sit to stands and wheel chair propulsion with leg.   Therapeutic Activity: -Sit to Stands - Pt performed sit<>stand on from wc with parallel bars with min to modA. VC required for leaning forward, standing tall by bringing hips forward. Pt also was able to alternate taking 1 hand off of the parallel bars while standing with maxA for introduction.    Wheelchair Mobility:  Pt propelled ~65 ft wheelchair with L leg to work on hamstring strength with minA for steering. Provided VC for dorsiflexing toes to dig heel down and back.   Patient seated in wc  at end of session with brakes locked, chair alarm set, and all needs within reach.   2nd Session   Patient reported that he was tired from the last session and that his R shoulder was in bad pain. Pt reports that the sit to stands from earlier made his shoulder hurt worse. Pain with active ROM. Pt performed the Nustep for endurance to give his R shoulder a rest.   Therapeutic Activity: -Nustep .4 Miles x 655 steps x 57 SPM x 2.2 METS x 4/15 resistance - 12 min - Pt performed slide board transfer from wc to Nustep with min A.   Wheelchair Mobility:  Pt propelled 130 ft in wc with L  leg to work on hamstring strength with minA for steering. Provided VC for dorsiflexing toes to dig heel down and back.   Patient seated in wc at end of session with brakes locked, chair alarm set, and all needs within reach.   Manual Therapy  Evaluation and Soft Tissue Mobilization -10 min -Upper Traps -Scapular Mobilization  Therapy Documentation Precautions:  Precautions Precautions: Fall Recall of Precautions/Restrictions: Intact Other Brace: RLE ampushield Restrictions Weight Bearing Restrictions Per Provider Order: Yes RLE Weight Bearing Per Provider Order: Non weight bearing Other Position/Activity Restrictions: no pillow under R knee     Therapy/Group: Individual Therapy  Emric Kowalewski 11/21/2023, 12:09 PM

## 2023-11-21 NOTE — Progress Notes (Signed)
 Occupational Therapy Session Note  Patient Details  Name: [name] MRN: [id number] Date of Birth: [birth year]  Session 1: Today's Date: 11/21/2023 OT Individual Time: 8979-8879 OT Individual Time Calculation (min): 60 min   Session 2: Today's Date: 11/21/2023 OT Individual Time: 8496-8453 OT Individual Time Calculation (min): 43 min   Short Term Goals: Week 1:  OT Short Term Goal 1 (Week 1): Pt will transfer to the toilet/ BSC with mod A +1 OT Short Term Goal 2 (Week 1): Pt will be able to perform sit to stands in prep for ADL tasks with mod A +1 consistenly OT Short Term Goal 3 (Week 1): Pt will perform LB dressing with AEprn with mod A OT Short Term Goal 4 (Week 1): Pt will perform bed mobility in prep for ADL tasks without bed features with supervision   Session 1: Skilled Therapeutic Interventions/Progress Updates:    Patient agreeable to participate in OT session. Reports 4/10 pain level.   Patient participated in skilled OT session focusing on functional transfer training, caregiver education, and patient safety. Patient ocmpleted slide board transfers bwteen bed and wc with max A for placement mod cues and CGA to min A. Patient completed wc mobility in hall for 75 ft with good skill and increased tolerance. Patient and caregiver educated on placement of slide board, use of LE, and safety during transfers. Patient completed core strengthening to increase ability to sit upright during transfers. Patient left in room and handed off for PT session.    Session 2: Skilled Therapeutic Interventions/Progress Updates:    Patient agreeable to participate in OT session. Reports 6-7/10 pain level.   Patient participated in skilled OT session focusing on activity tolerance, UE strengthening, and core strengthening. Patient complete UBE NuStep to increase UE ROM, decrease painful condition, and increase activity tolerance related to mobility. Patient completed dynamic reaching out of base  of support to increase core strength utilizing squigz. Patient able to complete with AROM L. AAROM on L. Patient completed Therapist provided verbal cues for maintain balance and proper positioning during core strengthening and reaching activities to elicit increased performance in dynamic sitting balance activities. Patient able to complete UE strengthening with therband in all planes utilizing yellow for R arm due to deficits and red for L arm with 2x10. Patient returned to room all needs in reach spouse present.   Therapy Documentation Precautions:  Precautions Precautions: Fall Recall of Precautions/Restrictions: Intact Other Brace: RLE ampushield Restrictions Weight Bearing Restrictions Per Provider Order: Yes RLE Weight Bearing Per Provider Order: Non weight bearing Other Position/Activity Restrictions: no pillow under R knee  Therapy/Group: Individual Therapy  D'mariea L Tramayne Sebesta OTR/L 11/21/2023, 7:55 AM

## 2023-11-21 NOTE — Progress Notes (Signed)
 PROGRESS NOTE   Subjective/Complaints: No new complaints or concerns. LBM yesterday. Reports pain is controlled.   ROS: Patient denies chills,  nausea, vomiting, shortness of breath or chest pain, headache, or mood change.  + muscle spasms-improved + loose Bms-improved   Objective:   No results found. Recent Labs    11/19/23 0932  WBC 9.8  HGB 10.3*  HCT 32.8*  PLT 353   Recent Labs    11/19/23 0932 11/21/23 0555  NA 140 138  K 3.7 3.4*  CL 103 104  CO2 25 26  GLUCOSE 99 73  BUN 16 20  CREATININE 0.71 0.64  CALCIUM  9.0 8.6*    Intake/Output Summary (Last 24 hours) at 11/21/2023 0953 Last data filed at 11/21/2023 0730 Gross per 24 hour  Intake 600 ml  Output 1175 ml  Net -575 ml        Physical Exam: Vital Signs Blood pressure 135/67, pulse 74, temperature 98 F (36.7 C), resp. rate 16, height 6' 2 (1.88 m), weight 92.4 kg, SpO2 97%.   Constitution: No acute distress,  laying in bed, appears comfortable Resp: CTAB, nonlabored breathing on room air Cardio: RRR Abdomen: Nondistended. Nontender.  + Bowel sounds Psych: Appropriate mood and affect. Pleasant Skin: BKA site with wound VAC, with black foam; well adhered, no significant output noted in canister, limb protector in place  Left foot with dry, flaking skin throughout; soft, boggy heels without apparent breakdown, Blanchable areas of erythema on top of 1st and 5th toes- not checked today Left upper extremity skin tear   Neuro: AAOx4. No apparent cognitive deficits  Sensory exam: revealed normal sensation in all dermatomal regions in bilateral upper extremities and with reduced sensation to light touch in distal left lower extremity Motor exam: strength 5/5 throughout bilateral upper extremities, bilateral lower extremities, and with exception of right shoulder 4/5  Prior neuro assessment is c/w today's exam 11/21/2023.     MSK: Right lower  extremity BKA, in extension brace and shrinker sock.  Well-formed, minimal edema, full range of motion in flexion and extension.   Right shoulder limited in flexion, abduction to less than 100 degrees.  No TTP   Left foot intoeing with flattened arch; ankle dorsiflexion passive range of motion just to neutral.  Prior second toe amputation.    Assessment/Plan: 1. Functional deficits which require 3+ hours per day of interdisciplinary therapy in a comprehensive inpatient rehab setting. Physiatrist is providing close team supervision and 24 hour management of active medical problems listed below. Physiatrist and rehab team continue to assess barriers to discharge/monitor patient progress toward functional and medical goals  Care Tool:  Bathing    Body parts bathed by patient: Right arm, Left arm, Chest, Abdomen, Right upper leg, Left upper leg, Face, Front perineal area   Body parts bathed by helper: Buttocks, Right upper leg Body parts n/a: Right lower leg   Bathing assist Assist Level: Moderate Assistance - Patient 50 - 74%     Upper Body Dressing/Undressing Upper body dressing   What is the patient wearing?: Pull over shirt    Upper body assist Assist Level: Minimal Assistance - Patient > 75%  Lower Body Dressing/Undressing Lower body dressing      What is the patient wearing?: Pants, Incontinence brief     Lower body assist Assist for lower body dressing: Total Assistance - Patient < 25%     Toileting Toileting    Toileting assist Assist for toileting: Total Assistance - Patient < 25%     Transfers Chair/bed transfer  Transfers assist  Chair/bed transfer activity did not occur: Safety/medical concerns  Chair/bed transfer assist level: Total Assistance - Patient < 25%     Locomotion Ambulation   Ambulation assist   Ambulation activity did not occur: Safety/medical concerns          Walk 10 feet activity   Assist  Walk 10 feet activity did not  occur: Safety/medical concerns        Walk 50 feet activity   Assist Walk 50 feet with 2 turns activity did not occur: Safety/medical concerns         Walk 150 feet activity   Assist Walk 150 feet activity did not occur: Safety/medical concerns         Walk 10 feet on uneven surface  activity   Assist Walk 10 feet on uneven surfaces activity did not occur: Safety/medical concerns         Wheelchair     Assist Is the patient using a wheelchair?: Yes Type of Wheelchair: Manual Wheelchair activity did not occur: Safety/medical concerns  Wheelchair assist level: Minimal Assistance - Patient > 75% Max wheelchair distance: 50    Wheelchair 50 feet with 2 turns activity    Assist    Wheelchair 50 feet with 2 turns activity did not occur: Safety/medical concerns   Assist Level: Minimal Assistance - Patient > 75%   Wheelchair 150 feet activity     Assist  Wheelchair 150 feet activity did not occur: Safety/medical concerns   Assist Level: Dependent - Patient 0%   Blood pressure 135/67, pulse 74, temperature 98 F (36.7 C), resp. rate 16, height 6' 2 (1.88 m), weight 92.4 kg, SpO2 97%.  Medical Problem List and Plan: 1. Functional deficits secondary to R BKA d/t osteomyelitis 11/14/23 by Dr. Harden, hx of charcot joint             -patient may not shower until wound vac removed             -ELOS/Goals: 16-18 days              -f/up with Dr. Harden 1wk after d/c, continue prevena plus wound vac             - Continue CIR  -Expected discharge 7/11  -Dr. Harden note indicates plan for wound vac at discharge for 1 week, consider removing  SundayMonday   2.  Antithrombotics: -DVT/anticoagulation:  Pharmaceutical: Eliquis  5mg  BID             -antiplatelet therapy: n/a 3. Pain Management/DDD/neuropathy: tylenol  and oxycodone  PRN, flexeril  PRN, voltaren  PRN, Lyrica  100mg  BID, Fioricet  PRN  -6/24 Advised trying PRN flexeril  for muscle spasms  -6/25 used  flexeril  last night, pain is controlled, continue current regimen  -6/27 occasional use of flexeril , continue current. Pain controlled overall, could consider trial decrease lyrica  since pain doing well 4. Mood/Behavior/Sleep: No concerns             -antipsychotic agents: n/a 5. Neuropsych/cognition: This patient is capable of making decisions on his own behalf. 6. Skin/Wound Care: routine skin checks, continue wound vac R BKA,  wound care LUE wound -Add Eucerin cream to left foot daily   7. Fluids/Electrolytes/Nutrition/hypokalemia/hyponatremia: routine I&O, routine labs, continue vitamins/supplements, continue Kdur 20mEq daily  -6/23 give additional 20 mEq of potassium today  6/25 K+ up to 3.7, continue supplementation and monitor  6/27 give additional 20meq today for K+ 3.4 8. PAF on Eliquis  and Diltiazem  180mg  daily 9. HFpEF (EF 40-45% 02/2023): resume home Lasix  40mg  daily, daily weights-ordered Filed Weights   11/19/23 0422 11/20/23 0600 11/21/23 0500  Weight: 87.9 kg 90.2 kg 92.4 kg  -6/25 wt a little lower, monitor tend, decrease lasix  to 20mg  daily, Cr was a little higher at 0.71/BUN 16, recheck friday -6/27 No signs of overload, Cr stable 0.64, Bun 20, continue current regimen and monitor   10. HTN: Continue Diltiazem  as above, home Imdur  on hold, resume as appropriate    11/21/2023    5:28 AM 11/21/2023    5:00 AM 11/20/2023    7:34 PM  Vitals with BMI  Weight  203 lbs 11 oz   BMI  26.14   Systolic 135  115  Diastolic 67  66  Pulse 74  88  6/24-27, controlled, monitor   11. HLD: continue Lipitor 20mg  daily 12. COPD/agent orange exposure: continue chronic Prednisone  20mg  daily, continue Brovana /Pulmicort  nebs, Mucinex  PRN, Duonebs PRN 13. BPH: continue Flomax  0.4mg  nightly 14. GERD: continue Protonix  40mg  BID, Maalox PRN 15. ?Gout/elevated uric acid: home med Allopurinol  100mg  qAM on hold, restart as appropriate.  16. Anemia, acute on chronic: Hgb 9-10 range during acute  stay, monitor labs  -6/23 stable continue to monitor  -6/25 HGB stable at 10.3 17. Vitamin B12 deficiency: 1000mcg SQ weekly ordered during admission, f/up on transition to oral per primary team 18. Constipation: LBM day of admission, continue Colace 100mg  BID and Miralax  daily  - 6/25 LBM today, continue to monitor   -6/26 discontinue Colace and change MiraLAX  to as needed  -6/27 LBM yesterday, continue to monitor    LOS: 5 days A FACE TO FACE EVALUATION WAS PERFORMED  Murray Collier 11/21/2023, 9:53 AM

## 2023-11-21 NOTE — Progress Notes (Signed)
 Physical Therapy Session Note  Patient Details  Name: Levi [name] MRN: [id number] Date of Birth: May 12, 1946  Today's Date: 11/21/2023 PT Individual Time: 9096-9068 PT Individual Time Calculation (min): 28 min   Short Term Goals: Week 1:  PT Short Term Goal 1 (Week 1): Pt will be minA for sit to stand PT Short Term Goal 2 (Week 1): Pt will be minA for bed mobility PT Short Term Goal 3 (Week 1): Pt will start wc mobility PT Short Term Goal 4 (Week 1): Pt will be modA for stand pivot transfer  Skilled Therapeutic Interventions/Progress Updates:  Patient supine in bed on entrance to room. Patient alert and agreeable to PT session. Wife present  Patient with no pain complaint at start of session.  [name] guided in doffing and re-donning of pt's limb guard. Doffed and demonstrated to reattach velcro straps and have pt lift leg then remove. Removed shrinker and discussed purpose for maintaining wound vac despite no drainage noted as suction is also approximating ends of surgical wound to help with healing.   Demonstrated to wife how to open end of shrinker with hands and don gently. Wife is able to return demonstrate and keep drainage tube in new location. Then is able to don limb guard with minimal vc.   Patient supine in bed at end of session with brakes locked, bed alarm set, and all needs within reach.   Therapy Documentation Precautions:  Precautions Precautions: Fall Recall of Precautions/Restrictions: Intact Other Brace: RLE ampushield Restrictions Weight Bearing Restrictions Per Provider Order: Yes RLE Weight Bearing Per Provider Order: Non weight bearing Other Position/Activity Restrictions: no pillow under R knee  Pain:  No pain related by pt this session.   Therapy/Group: Individual Therapy  Levi [name] PT, DPT, CSRS 11/21/2023, 5:51 PM

## 2023-11-21 NOTE — Plan of Care (Signed)
  Problem: Consults Goal: RH LIMB LOSS PATIENT EDUCATION Description: Description: See Patient Education module for eduction specifics. Outcome: Progressing Goal: Skin Care Protocol Initiated - if Braden Score 18 or less Description: If consults are not indicated, leave blank or document N/A Outcome: Progressing   Problem: RH BOWEL ELIMINATION Goal: RH STG MANAGE BOWEL WITH ASSISTANCE Description: STG Manage Bowel with toileting Assistance. Outcome: Progressing Goal: RH STG MANAGE BOWEL W/MEDICATION W/ASSISTANCE Description: STG Manage Bowel with Medication with mod I Assistance. Outcome: Progressing   Problem: RH BLADDER ELIMINATION Goal: RH STG MANAGE BLADDER WITH ASSISTANCE Description: STG Manage Bladder With  toileting Assistance Outcome: Progressing Goal: RH STG MANAGE BLADDER WITH MEDICATION WITH ASSISTANCE Description: STG Manage Bladder With Medication With mod I Assistance. Outcome: Progressing   Problem: RH KNOWLEDGE DEFICIT LIMB LOSS Goal: RH STG INCREASE KNOWLEDGE OF SELF CARE AFTER LIMB LOSS Description: Patient and wife will be able to manage care at discharge using educational resources for medication, skin care, dietary ,modification independently Outcome: Progressing   Problem: RH PAIN MANAGEMENT Goal: RH STG PAIN MANAGED AT OR BELOW PT'S PAIN GOAL Description: < 4 with prns Outcome: Progressing   Problem: RH SAFETY Goal: RH STG ADHERE TO SAFETY PRECAUTIONS W/ASSISTANCE/DEVICE Description: STG Adhere to Safety Precautions With cues Assistance/Device. Outcome: Progressing Goal: RH STG DECREASED RISK OF FALL WITH ASSISTANCE Description: STG Decreased Risk of Fall With cues Assistance. Outcome: Progressing

## 2023-11-22 DIAGNOSIS — D649 Anemia, unspecified: Secondary | ICD-10-CM

## 2023-11-22 MED ORDER — PROSOURCE PLUS PO LIQD
30.0000 mL | Freq: Two times a day (BID) | ORAL | Status: DC
  Administered 2023-11-22 – 2023-12-05 (×24): 30 mL via ORAL
  Filled 2023-11-22 (×24): qty 30

## 2023-11-22 NOTE — Progress Notes (Signed)
 PROGRESS NOTE   Subjective/Complaints: No new complaints or concerns.  Continue taking in right shoulder, this is chronic but he feels like he is moving it better overall.  Does not like Ensure, nursing will try different protein supplementation.  LBM yesterday.  ROS: Patient denies chills,  nausea, vomiting, shortness of breath or chest pain, headache, or mood change.  + muscle spasms-improved + loose Bms-improved + chronic R shoulder pain  Objective:   No results found. No results for input(s): WBC, HGB, HCT, PLT in the last 72 hours.  Recent Labs    11/21/23 0555  NA 138  K 3.4*  CL 104  CO2 26  GLUCOSE 73  BUN 20  CREATININE 0.64  CALCIUM  8.6*    Intake/Output Summary (Last 24 hours) at 11/22/2023 1239 Last data filed at 11/22/2023 0856 Gross per 24 hour  Intake 1320 ml  Output 950 ml  Net 370 ml        Physical Exam: Vital Signs Blood pressure (!) 123/90, pulse 86, temperature (!) 97.5 F (36.4 C), temperature source Axillary, resp. rate 18, height 6' 2 (1.88 m), weight 89.5 kg, SpO2 98%.   Constitution: No acute distress,  laying in bed, appears comfortable Resp: CTAB, on RA Cardio: RRR Abdomen: Nondistended. Nontender.  + Bowel sounds Psych: Appropriate mood and affect. Pleasant Skin: BKA site with wound VAC, with black foam; well adhered, no significant output noted in canister, limb protector in place  Left foot with dry, flaking skin throughout; soft, boggy heels without apparent breakdown, Blanchable areas of erythema on top of 1st and 5th toes- not checked today Left upper extremity skin tear   Neuro: AAOx4. No apparent cognitive deficits  Sensory exam: revealed normal sensation in all dermatomal regions in bilateral upper extremities and with reduced sensation to light touch in distal left lower extremity Motor exam: strength 5/5 throughout bilateral upper extremities, bilateral  lower extremities, and with exception of right shoulder 4/5    Prior neuro assessment is c/w today's exam 11/22/2023.     MSK: Right lower extremity BKA, in extension brace and shrinker sock.  Well-formed, minimal edema, full range of motion in flexion and extension.   Right shoulder limited in flexion, abduction to less than 100 degrees.  No TTP. Pain with shoulder abduction.    Left foot intoeing with flattened arch; ankle dorsiflexion passive range of motion just to neutral.  Prior second toe amputation.    Assessment/Plan: 1. Functional deficits which require 3+ hours per day of interdisciplinary therapy in a comprehensive inpatient rehab setting. Physiatrist is providing close team supervision and 24 hour management of active medical problems listed below. Physiatrist and rehab team continue to assess barriers to discharge/monitor patient progress toward functional and medical goals  Care Tool:  Bathing    Body parts bathed by patient: Right arm, Left arm, Chest, Abdomen, Right upper leg, Left upper leg, Face, Front perineal area   Body parts bathed by helper: Buttocks, Right upper leg Body parts n/a: Right lower leg   Bathing assist Assist Level: Moderate Assistance - Patient 50 - 74%     Upper Body Dressing/Undressing Upper body dressing   What is the  patient wearing?: Pull over shirt    Upper body assist Assist Level: Minimal Assistance - Patient > 75%    Lower Body Dressing/Undressing Lower body dressing      What is the patient wearing?: Pants, Incontinence brief     Lower body assist Assist for lower body dressing: Total Assistance - Patient < 25%     Toileting Toileting    Toileting assist Assist for toileting: Total Assistance - Patient < 25%     Transfers Chair/bed transfer  Transfers assist  Chair/bed transfer activity did not occur: Safety/medical concerns  Chair/bed transfer assist level: Total Assistance - Patient < 25%      Locomotion Ambulation   Ambulation assist   Ambulation activity did not occur: Safety/medical concerns          Walk 10 feet activity   Assist  Walk 10 feet activity did not occur: Safety/medical concerns        Walk 50 feet activity   Assist Walk 50 feet with 2 turns activity did not occur: Safety/medical concerns         Walk 150 feet activity   Assist Walk 150 feet activity did not occur: Safety/medical concerns         Walk 10 feet on uneven surface  activity   Assist Walk 10 feet on uneven surfaces activity did not occur: Safety/medical concerns         Wheelchair     Assist Is the patient using a wheelchair?: Yes Type of Wheelchair: Manual Wheelchair activity did not occur: Safety/medical concerns  Wheelchair assist level: Minimal Assistance - Patient > 75% Max wheelchair distance: 50    Wheelchair 50 feet with 2 turns activity    Assist    Wheelchair 50 feet with 2 turns activity did not occur: Safety/medical concerns   Assist Level: Minimal Assistance - Patient > 75%   Wheelchair 150 feet activity     Assist  Wheelchair 150 feet activity did not occur: Safety/medical concerns   Assist Level: Dependent - Patient 0%   Blood pressure (!) 123/90, pulse 86, temperature (!) 97.5 F (36.4 C), temperature source Axillary, resp. rate 18, height 6' 2 (1.88 m), weight 89.5 kg, SpO2 98%.  Medical Problem List and Plan: 1. Functional deficits secondary to R BKA d/t osteomyelitis 11/14/23 by Dr. Harden, hx of charcot joint             -patient may not shower until wound vac removed             -ELOS/Goals: 16-18 days              -f/up with Dr. Harden 1wk after d/c, continue prevena plus wound vac             - Continue CIR  -Expected discharge 7/11  -Dr. Harden note indicates plan for wound vac at discharge for 1 week, consider removing  SundayMonday   2.  Antithrombotics: -DVT/anticoagulation:  Pharmaceutical: Eliquis  5mg  BID              -antiplatelet therapy: n/a 3. Pain Management/DDD/neuropathy: tylenol  and oxycodone  PRN, flexeril  PRN, voltaren  PRN, Lyrica  100mg  BID, Fioricet  PRN  -6/24 Advised trying PRN flexeril  for muscle spasms  -6/25 used flexeril  last night, pain is controlled, continue current regimen  -6/27 occasional use of flexeril , continue current. Pain controlled overall, could consider trial decrease lyrica  since pain doing well  6/28 not requiring recent flexeril , pain overall controlled 4. Mood/Behavior/Sleep: No concerns             -  antipsychotic agents: n/a 5. Neuropsych/cognition: This patient is capable of making decisions on his own behalf. 6. Skin/Wound Care: routine skin checks, continue wound vac R BKA, wound care LUE wound -Add Eucerin cream to left foot daily   7. Fluids/Electrolytes/Nutrition/hypokalemia/hyponatremia: routine I&O, routine labs, continue vitamins/supplements, continue Kdur 20mEq daily  -6/23 give additional 20 mEq of potassium today  6/25 K+ up to 3.7, continue supplementation and monitor  6/27 give additional 20meq today for K+ 3.4  Recheck Monday  8. PAF on Eliquis  and Diltiazem  180mg  daily 9. HFpEF (EF 40-45% 02/2023): resume home Lasix  40mg  daily, daily weights-ordered Filed Weights   11/20/23 0600 11/21/23 0500 11/22/23 0500  Weight: 90.2 kg 92.4 kg 89.5 kg  -6/25 wt a little lower, monitor tend, decrease lasix  to 20mg  daily, Cr was a little higher at 0.71/BUN 16, recheck friday -6/27-28 No signs of overload, Cr stable 0.64, Bun 20, continue current regimen and monitor   10. HTN: Continue Diltiazem  as above, home Imdur  on hold, resume as appropriate    11/22/2023    5:00 AM 11/22/2023    4:44 AM 11/21/2023    7:47 PM  Vitals with BMI  Weight 197 lbs 5 oz    BMI 25.32    Systolic  123 121  Diastolic  90 71  Pulse  86 76  6/24-28, controlled, monitor   11. HLD: continue Lipitor 20mg  daily 12. COPD/agent orange exposure: continue chronic Prednisone  20mg   daily, continue Brovana /Pulmicort  nebs, Mucinex  PRN, Duonebs PRN 13. BPH: continue Flomax  0.4mg  nightly 14. GERD: continue Protonix  40mg  BID, Maalox PRN 15. ?Gout/elevated uric acid: home med Allopurinol  100mg  qAM on hold, restart as appropriate.  16. Anemia, acute on chronic: Hgb 9-10 range during acute stay, monitor labs  -6/23 stable continue to monitor  -6/25 HGB stable at 10.3  Recheck monday 17. Vitamin B12 deficiency: 1000mcg SQ weekly ordered during admission, f/up on transition to oral per primary team 18. Constipation: LBM day of admission, continue Colace 100mg  BID and Miralax  daily  - 6/25 LBM today, continue to monitor   -6/26 discontinue Colace and change MiraLAX  to as needed  -6/28 LBM yesterday, continue to monitor    LOS: 6 days A FACE TO FACE EVALUATION WAS PERFORMED  Murray Collier 11/22/2023, 12:39 PM

## 2023-11-22 NOTE — Plan of Care (Signed)
  Problem: Consults Goal: RH LIMB LOSS PATIENT EDUCATION Description: Description: See Patient Education module for eduction specifics. Outcome: Progressing Goal: Skin Care Protocol Initiated - if Braden Score 18 or less Description: If consults are not indicated, leave blank or document N/A Outcome: Progressing   Problem: RH BOWEL ELIMINATION Goal: RH STG MANAGE BOWEL WITH ASSISTANCE Description: STG Manage Bowel with toileting Assistance. Outcome: Progressing Goal: RH STG MANAGE BOWEL W/MEDICATION W/ASSISTANCE Description: STG Manage Bowel with Medication with mod I Assistance. Outcome: Progressing   Problem: RH BLADDER ELIMINATION Goal: RH STG MANAGE BLADDER WITH ASSISTANCE Description: STG Manage Bladder With  toileting Assistance Outcome: Progressing Goal: RH STG MANAGE BLADDER WITH MEDICATION WITH ASSISTANCE Description: STG Manage Bladder With Medication With mod I Assistance. Outcome: Progressing   Problem: RH SKIN INTEGRITY Goal: RH STG MAINTAIN SKIN INTEGRITY WITH ASSISTANCE Description: STG Maintain Skin Integrity With min Assistance. Outcome: Progressing Goal: RH STG ABLE TO PERFORM INCISION/WOUND CARE W/ASSISTANCE Description: STG Able To Perform Incision/Wound Care With min Assistance. Outcome: Progressing   Problem: RH SAFETY Goal: RH STG ADHERE TO SAFETY PRECAUTIONS W/ASSISTANCE/DEVICE Description: STG Adhere to Safety Precautions With cues Assistance/Device. Outcome: Progressing Goal: RH STG DECREASED RISK OF FALL WITH ASSISTANCE Description: STG Decreased Risk of Fall With cues Assistance. Outcome: Progressing   Problem: RH PAIN MANAGEMENT Goal: RH STG PAIN MANAGED AT OR BELOW PT'S PAIN GOAL Description: < 4 with prns Outcome: Progressing   Problem: RH KNOWLEDGE DEFICIT LIMB LOSS Goal: RH STG INCREASE KNOWLEDGE OF SELF CARE AFTER LIMB LOSS Description: Patient and wife will be able to manage care at discharge using educational resources for medication,  skin care, dietary ,modification independently Outcome: Progressing

## 2023-11-22 NOTE — Plan of Care (Signed)
  Problem: Consults Goal: RH LIMB LOSS PATIENT EDUCATION Description: Description: See Patient Education module for eduction specifics. Outcome: Progressing Goal: Skin Care Protocol Initiated - if Braden Score 18 or less Description: If consults are not indicated, leave blank or document N/A Outcome: Progressing   Problem: RH BOWEL ELIMINATION Goal: RH STG MANAGE BOWEL WITH ASSISTANCE Description: STG Manage Bowel with toileting Assistance. Outcome: Progressing Goal: RH STG MANAGE BOWEL W/MEDICATION W/ASSISTANCE Description: STG Manage Bowel with Medication with mod I Assistance. Outcome: Progressing   Problem: RH BLADDER ELIMINATION Goal: RH STG MANAGE BLADDER WITH ASSISTANCE Description: STG Manage Bladder With  toileting Assistance Outcome: Progressing Goal: RH STG MANAGE BLADDER WITH MEDICATION WITH ASSISTANCE Description: STG Manage Bladder With Medication With mod I Assistance. Outcome: Progressing

## 2023-11-23 MED ORDER — DICLOFENAC SODIUM 1 % EX GEL
4.0000 g | Freq: Four times a day (QID) | CUTANEOUS | Status: DC
  Administered 2023-11-23 – 2023-12-04 (×22): 4 g via TOPICAL
  Filled 2023-11-23: qty 100

## 2023-11-23 NOTE — Progress Notes (Addendum)
 PROGRESS NOTE   Subjective/Complaints: Continues to have pain in his right shoulder, history of chronic rotator cuff dysfunction.  Reports he was previously offered surgery but declined at the time.  Reports his shoulder function is improving.  No additional concerns or complaints.  ROS: Patient denies fevers, N/V/D/C shortness of breath or chest pain, headache, or mood change.  + muscle spasms-improved + chronic R shoulder pain- continued  Objective:   No results found. No results for input(s): WBC, HGB, HCT, PLT in the last 72 hours.  Recent Labs    11/21/23 0555  NA 138  K 3.4*  CL 104  CO2 26  GLUCOSE 73  BUN 20  CREATININE 0.64  CALCIUM  8.6*    Intake/Output Summary (Last 24 hours) at 11/23/2023 1130 Last data filed at 11/23/2023 1000 Gross per 24 hour  Intake 813 ml  Output 2000 ml  Net -1187 ml        Physical Exam: Vital Signs Blood pressure 116/65, pulse 80, temperature 97.7 F (36.5 C), temperature source Oral, resp. rate 17, height 6' 2 (1.88 m), weight 87.2 kg, SpO2 98%.   Constitution: No acute distress,  laying in bed, appears comfortable Resp: CTAB, on RA Cardio: RRR Abdomen: Nondistended. Nontender.  + Bowel sounds Psych: Appropriate mood and affect. Pleasant Skin: BKA site with wound VAC, with black foam; well adhered, no output noted in canister, limb protector in place  Left foot with dry, flaking skin throughout; soft, boggy heels without apparent breakdown, Blanchable areas of erythema on top of 1st and 5th toes Left upper extremity skin tear   Neuro: AAOx4. No apparent cognitive deficits  Sensory exam: revealed normal sensation in all dermatomal regions in bilateral upper extremities and with reduced sensation to light touch in distal left lower extremity Motor exam: strength 5/5 throughout bilateral upper extremities, bilateral lower extremities, and with exception of right  shoulder 4/5    Prior neuro assessment is c/w today's exam 11/23/2023.     MSK: Right lower extremity BKA, in extension brace and shrinker sock.  Well-formed, minimal edema, full range of motion in flexion and extension.   Right shoulder limited in flexion, abduction to less than 100 degrees.  No TTP. Pain with shoulder abduction, internal and external rotation.    Left foot intoeing with flattened arch; ankle dorsiflexion passive range of motion just to neutral.  Prior second toe amputation.    Assessment/Plan: 1. Functional deficits which require 3+ hours per day of interdisciplinary therapy in a comprehensive inpatient rehab setting. Physiatrist is providing close team supervision and 24 hour management of active medical problems listed below. Physiatrist and rehab team continue to assess barriers to discharge/monitor patient progress toward functional and medical goals  Care Tool:  Bathing    Body parts bathed by patient: Right arm, Left arm, Chest, Abdomen, Right upper leg, Left upper leg, Face, Front perineal area   Body parts bathed by helper: Buttocks, Right upper leg Body parts n/a: Right lower leg   Bathing assist Assist Level: Moderate Assistance - Patient 50 - 74%     Upper Body Dressing/Undressing Upper body dressing   What is the patient wearing?: Pull over shirt  Upper body assist Assist Level: Minimal Assistance - Patient > 75%    Lower Body Dressing/Undressing Lower body dressing      What is the patient wearing?: Pants, Incontinence brief     Lower body assist Assist for lower body dressing: Total Assistance - Patient < 25%     Toileting Toileting    Toileting assist Assist for toileting: Total Assistance - Patient < 25%     Transfers Chair/bed transfer  Transfers assist  Chair/bed transfer activity did not occur: Safety/medical concerns  Chair/bed transfer assist level: Total Assistance - Patient < 25%      Locomotion Ambulation   Ambulation assist   Ambulation activity did not occur: Safety/medical concerns          Walk 10 feet activity   Assist  Walk 10 feet activity did not occur: Safety/medical concerns        Walk 50 feet activity   Assist Walk 50 feet with 2 turns activity did not occur: Safety/medical concerns         Walk 150 feet activity   Assist Walk 150 feet activity did not occur: Safety/medical concerns         Walk 10 feet on uneven surface  activity   Assist Walk 10 feet on uneven surfaces activity did not occur: Safety/medical concerns         Wheelchair     Assist Is the patient using a wheelchair?: Yes Type of Wheelchair: Manual Wheelchair activity did not occur: Safety/medical concerns  Wheelchair assist level: Minimal Assistance - Patient > 75% Max wheelchair distance: 50    Wheelchair 50 feet with 2 turns activity    Assist    Wheelchair 50 feet with 2 turns activity did not occur: Safety/medical concerns   Assist Level: Minimal Assistance - Patient > 75%   Wheelchair 150 feet activity     Assist  Wheelchair 150 feet activity did not occur: Safety/medical concerns   Assist Level: Dependent - Patient 0%   Blood pressure 116/65, pulse 80, temperature 97.7 F (36.5 C), temperature source Oral, resp. rate 17, height 6' 2 (1.88 m), weight 87.2 kg, SpO2 98%.  Medical Problem List and Plan: 1. Functional deficits secondary to R BKA d/t osteomyelitis 11/14/23 by Dr. Harden, hx of charcot joint             -patient may not shower until wound vac removed             -ELOS/Goals: 16-18 days              -f/up with Dr. Harden 1wk after d/c, continue prevena plus wound vac             - Continue CIR  -Expected discharge 7/11  -Dr. Harden note indicates plan for wound vac at discharge for 1 week,   DC wound vac   2.  Antithrombotics: -DVT/anticoagulation:  Pharmaceutical: Eliquis  5mg  BID             -antiplatelet  therapy: n/a 3. Pain Management/DDD/neuropathy: tylenol  and oxycodone  PRN, flexeril  PRN, voltaren  PRN, Lyrica  100mg  BID, Fioricet  PRN  -6/24 Advised trying PRN flexeril  for muscle spasms  -6/25 used flexeril  last night, pain is controlled, continue current regimen  -6/27 occasional use of flexeril , continue current. Pain controlled overall, could consider trial decrease lyrica  since pain doing well  6/28 not requiring recent flexeril , pain overall controlled  6/29 Voltaren  for right shoulder pain scheduled 4. Mood/Behavior/Sleep: No concerns             -  antipsychotic agents: n/a 5. Neuropsych/cognition: This patient is capable of making decisions on his own behalf. 6. Skin/Wound Care: routine skin checks, continue wound vac R BKA, wound care LUE wound -Add Eucerin cream to left foot daily   7. Fluids/Electrolytes/Nutrition/hypokalemia/hyponatremia: routine I&O, routine labs, continue vitamins/supplements, continue Kdur 20mEq daily  -6/23 give additional 20 mEq of potassium today  6/25 K+ up to 3.7, continue supplementation and monitor  6/27 give additional 20meq today for K+ 3.4  Recheck tomorrow 8. PAF on Eliquis  and Diltiazem  180mg  daily  - Heart rate controlled 9. HFpEF (EF 40-45% 02/2023): resume home Lasix  40mg  daily, daily weights-ordered Filed Weights   11/21/23 0500 11/22/23 0500 11/23/23 0500  Weight: 92.4 kg 89.5 kg 87.2 kg  -6/25 wt a little lower, monitor tend, decrease lasix  to 20mg  daily, Cr was a little higher at 0.71/BUN 16, recheck friday -6/29 No signs of overload, continue to monitor weight and recheck labs tomorrow  10. HTN: Continue Diltiazem  as above, home Imdur  on hold, resume as appropriate    11/23/2023    5:45 AM 11/23/2023    5:00 AM 11/22/2023    8:00 PM  Vitals with BMI  Weight  192 lbs 4 oz   BMI  24.67   Systolic 116  117  Diastolic 65  73  Pulse 80  86  6/24-29, controlled, monitor   11. HLD: continue Lipitor 20mg  daily 12. COPD/agent orange  exposure: continue chronic Prednisone  20mg  daily, continue Brovana /Pulmicort  nebs, Mucinex  PRN, Duonebs PRN 13. BPH: continue Flomax  0.4mg  nightly 14. GERD: continue Protonix  40mg  BID, Maalox PRN 15. ?Gout/elevated uric acid: home med Allopurinol  100mg  qAM on hold, restart as appropriate.  16. Anemia, acute on chronic: Hgb 9-10 range during acute stay, monitor labs  -6/23 stable continue to monitor  -6/25 HGB stable at 10.3  Recheck tomorrow 17. Vitamin B12 deficiency: 1000mcg SQ weekly ordered during admission, f/up on transition to oral per primary team 18. Constipation: LBM day of admission, continue Colace 100mg  BID and Miralax  daily  - 6/25 LBM today, continue to monitor   -6/26 discontinue Colace and change MiraLAX  to as needed  -6/29 LBM yesterday, continue to monitor    LOS: 7 days A FACE TO FACE EVALUATION WAS PERFORMED  Murray Collier 11/23/2023, 11:30 AM

## 2023-11-23 NOTE — Plan of Care (Signed)
 Patient has progressed in all areas, same with family in terms of education and ambulation and safety  Problem: Consults Goal: RH LIMB LOSS PATIENT EDUCATION Description: Description: See Patient Education module for eduction specifics. Outcome: Progressing Goal: Skin Care Protocol Initiated - if Braden Score 18 or less Description: If consults are not indicated, leave blank or document N/A Outcome: Progressing   Problem: RH BOWEL ELIMINATION Goal: RH STG MANAGE BOWEL WITH ASSISTANCE Description: STG Manage Bowel with toileting Assistance. Outcome: Progressing Goal: RH STG MANAGE BOWEL W/MEDICATION W/ASSISTANCE Description: STG Manage Bowel with Medication with mod I Assistance. Outcome: Progressing   Problem: RH BLADDER ELIMINATION Goal: RH STG MANAGE BLADDER WITH ASSISTANCE Description: STG Manage Bladder With  toileting Assistance Outcome: Progressing Goal: RH STG MANAGE BLADDER WITH MEDICATION WITH ASSISTANCE Description: STG Manage Bladder With Medication With mod I Assistance. Outcome: Progressing

## 2023-11-23 NOTE — Progress Notes (Signed)
 Physical Therapy Session Note  Patient Details  Name: Levi [name] MRN: [id number] Date of Birth: 07-25-1945  Today's Date: 11/23/2023 PT Individual Time: 8643-8550 PT Individual Time Calculation (min): 53 min   Short Term Goals: Week 1:  PT Short Term Goal 1 (Week 1): Pt will be minA for sit to stand PT Short Term Goal 2 (Week 1): Pt will be minA for bed mobility PT Short Term Goal 3 (Week 1): Pt will start wc mobility PT Short Term Goal 4 (Week 1): Pt will be modA for stand pivot transfer  Skilled Therapeutic Interventions/Progress Updates:      Pt supine in bed upon arrival. Pt agreeable to therapy. Pt denies any pain.   Bed mobility:   Pt incontinent of bladder. Pt performed supine to sit EOB with use of bed features and CGA. Pt performed doffed pants while sitting EOB with CGA, with use of lateral trunk lean technique, verbal cues provided for correction of posterior bias. Pt incontinent of bowel (pt unaware of incontinence of bowel or bladder).  Pt performed sit to supine with mod A for management of B LE. Pt performed rolling B with use of bed rails with supervision  Pt continent of bowel on bed pan. Pt performed pericare with total A.   Pt performed supine to sit with use of bed features and superivsion with bed flat, donned pants while seated EOB with supervision with use of lateral trunk lean tehcnique with verbal and tactile cues for correction of posterior bias. Pt demonstrates improved technique.   Pt performed lateral scooting to L with verbal cues provided for LE positioning, head hip ratio, and anterior weight shift.   Pt performed sit to supine with supervision!! With use of bed rails, verbal cues provided for UE positioning/sequencing.   Pt donned R LE limb protector with mod A, therapist donned tape on end of velcro straps for improved ease for pt, verbal cues provided for orientation.   Pt performed downhill slide board transfer with min A with pt wife  placing/removing the slide board, and adjusting pt pants (caught on the board). Verbal cues provided for proper placement of board and head hip ratio.   Pt wife donned leg rests with min A, verbal cues provided for orientation and mechanism for locking.   Pt seated in [facility] with all needs within reach and chair alarm on.     Therapy Documentation Precautions:  Precautions Precautions: Fall Recall of Precautions/Restrictions: Intact Other Brace: RLE ampushield Restrictions Weight Bearing Restrictions Per Provider Order: Yes RLE Weight Bearing Per Provider Order: Non weight bearing Other Position/Activity Restrictions: no pillow under R knee   Therapy/Group: Individual Therapy  [facility] [place], [place], DPT  11/23/2023, 8:02 AM

## 2023-11-23 NOTE — Plan of Care (Signed)
  Problem: Consults Goal: RH LIMB LOSS PATIENT EDUCATION Description: Description: See Patient Education module for eduction specifics. Outcome: Progressing Goal: Skin Care Protocol Initiated - if Braden Score 18 or less Description: If consults are not indicated, leave blank or document N/A Outcome: Progressing   Problem: RH BOWEL ELIMINATION Goal: RH STG MANAGE BOWEL WITH ASSISTANCE Description: STG Manage Bowel with toileting Assistance. Outcome: Progressing Goal: RH STG MANAGE BOWEL W/MEDICATION W/ASSISTANCE Description: STG Manage Bowel with Medication with mod I Assistance. Outcome: Progressing   Problem: RH BLADDER ELIMINATION Goal: RH STG MANAGE BLADDER WITH ASSISTANCE Description: STG Manage Bladder With  toileting Assistance Outcome: Progressing Goal: RH STG MANAGE BLADDER WITH MEDICATION WITH ASSISTANCE Description: STG Manage Bladder With Medication With mod I Assistance. Outcome: Progressing   Problem: RH SKIN INTEGRITY Goal: RH STG MAINTAIN SKIN INTEGRITY WITH ASSISTANCE Description: STG Maintain Skin Integrity With min Assistance. Outcome: Progressing Goal: RH STG ABLE TO PERFORM INCISION/WOUND CARE W/ASSISTANCE Description: STG Able To Perform Incision/Wound Care With min Assistance. Outcome: Progressing   Problem: RH SAFETY Goal: RH STG ADHERE TO SAFETY PRECAUTIONS W/ASSISTANCE/DEVICE Description: STG Adhere to Safety Precautions With cues Assistance/Device. Outcome: Progressing Goal: RH STG DECREASED RISK OF FALL WITH ASSISTANCE Description: STG Decreased Risk of Fall With cues Assistance. Outcome: Progressing   Problem: RH PAIN MANAGEMENT Goal: RH STG PAIN MANAGED AT OR BELOW PT'S PAIN GOAL Description: < 4 with prns Outcome: Progressing   Problem: RH KNOWLEDGE DEFICIT LIMB LOSS Goal: RH STG INCREASE KNOWLEDGE OF SELF CARE AFTER LIMB LOSS Description: Patient and wife will be able to manage care at discharge using educational resources for medication,  skin care, dietary ,modification independently Outcome: Progressing

## 2023-11-24 DIAGNOSIS — F54 Psychological and behavioral factors associated with disorders or diseases classified elsewhere: Secondary | ICD-10-CM

## 2023-11-24 LAB — CBC
HCT: 29.8 % — ABNORMAL LOW (ref 39.0–52.0)
Hemoglobin: 9.5 g/dL — ABNORMAL LOW (ref 13.0–17.0)
MCH: 29.7 pg (ref 26.0–34.0)
MCHC: 31.9 g/dL (ref 30.0–36.0)
MCV: 93.1 fL (ref 80.0–100.0)
Platelets: 385 K/uL (ref 150–400)
RBC: 3.2 MIL/uL — ABNORMAL LOW (ref 4.22–5.81)
RDW: 15.6 % — ABNORMAL HIGH (ref 11.5–15.5)
WBC: 9.3 K/uL (ref 4.0–10.5)
nRBC: 0 % (ref 0.0–0.2)

## 2023-11-24 LAB — BASIC METABOLIC PANEL WITH GFR
Anion gap: 11 (ref 5–15)
BUN: 27 mg/dL — ABNORMAL HIGH (ref 8–23)
CO2: 25 mmol/L (ref 22–32)
Calcium: 8.8 mg/dL — ABNORMAL LOW (ref 8.9–10.3)
Chloride: 105 mmol/L (ref 98–111)
Creatinine, Ser: 0.61 mg/dL (ref 0.61–1.24)
GFR, Estimated: >60 mL/min
Glucose, Bld: 84 mg/dL (ref 70–99)
Potassium: 3.3 mmol/L — ABNORMAL LOW (ref 3.5–5.1)
Sodium: 141 mmol/L (ref 135–145)

## 2023-11-24 LAB — GLUCOSE, CAPILLARY: Glucose-Capillary: 130 mg/dL — ABNORMAL HIGH (ref 70–99)

## 2023-11-24 MED ORDER — POTASSIUM CHLORIDE CRYS ER 20 MEQ PO TBCR
40.0000 meq | EXTENDED_RELEASE_TABLET | Freq: Every day | ORAL | Status: DC
  Administered 2023-11-24 – 2023-12-05 (×12): 40 meq via ORAL
  Filled 2023-11-24 (×12): qty 2

## 2023-11-24 NOTE — Consult Note (Signed)
 Neuropsychological Consultation Comprehensive Inpatient Rehab   Patient:   Levi [name]   DOB:   1946/01/07  MR Number:  [id number]  Location:  [facility] [address] CENTER [address] [place] [address] Dept: [phone] Loc: [phone]           Date of Service:   11/24/2023  Start Time:   8 AM End Time:   9 AM  Provider/Observer:  Norleen Asa, Psy.D.       Clinical Neuropsychologist       Billing Code/Service: [phone]  Reason for Service:    Levi [name] is a 78 year old male referred for neuropsychological consultation during his ongoing admission to the comprehensive inpatient rehabilitation unit due to coping and adjustment issues with recent BKA and multiple health issues.  Patient is coping with recent loss of limb after extensive efforts to salvage his right foot.  Patient has a past medical history including PAF on Eliquis , hypertension, hyperlipidemia, COPD, BPH, GERD, anemia and past agent orange exposure.  Patient has had several orthopedic interventions to salvage his foot and also has previous diagnosis of Charcot right foot.  Patient presented on 11/08/2023 to ED after presyncopal/fall and identified osteomyelitis.  Patient was tachycardic and tachypneic in the ED.  Patient started on IV antibiotics and admitted with MRI right foot on 11/08/2023 confirming osteomyelitis and Charcot degeneration.  Patient underwent right BKA on 11/14/2023.  Patient admitted to CIR due to functional declines after his BKA.  During today's clinical visit the patient was oriented x 4 with good cognition.  Patient's wife was also present in the room.  Patient's wife noted that the patient has been very sedentary for quite some time due to severe pain and other complications with his right foot.  Patient notes his motivation to try to regain functional mobility and be ready for prosthetic fitting and 3-4 months.  Patient denied any  significant mood disturbance keeping him from being an active participant in therapeutic interventions.  Patient's wife is able to give a lot of support and had specific questions about what she may be needed to do.  I focused discussions today about expectations and how to cope with the sudden changes in functional status.  Coping and adjustment issues were the primary focus of today's visit.  HPI for the current admission:    HPI: Levi [name] is a 78 y.o. male with a PMHx of PAF on eliquis , HFpEF (EF 40-45% in 02/2023), HTN, HLD, COPD/agent orange exposure, BPH, GERD, anemia, and s/p recent LLE 2nd digit/RLE 5th digit ray amputation on 08/13/2023 and R foot exostectomy 10/16/23, who presented to the ED on 11/08/23 with presyncope/fall 2/2 sepsis 2/2 RLE cellulitis/osteomyelitis. In the ED, tachycardic and tachypneic, labs showing K 2.9, Cr 1.05, albumin 2.9, WBC 13.8, and lactate 2.4, CRP 16.5, procal 1.92, ESR 30. CTH NAICA. CT C-spine no acute abnl. CXR no acute cardiopulmonary findings. XR R foot showed moderate edema, advanced Charcot foot, and old fifth toe amputation and remote resection of the second through fourth proximal phalangeal heads. Pt received IV vancomycin , IV cefepime , and IV metronidazole  in the ED and was admitted. Vanc/cefepime /flagyl  were continued during admission, MRI R foot on 11/08/23 confirmed osteomyelitis and charcot degeneration. BCx NGTD x2. He underwent R BKA with Dr. Harden on 11/14/23 and abx were discontinued 24hrs after amputation. He requires comprehensive rehab program due to functional decline after his BKA.   Medical History:   Past  Medical History:  Diagnosis Date   Agent orange exposure 1970's   takes Imdur ,Diltiazem , and Atacand  daily   Agent orange exposure    ALLERGIC RHINITIS    Allergy     seasonal allergies   Anemia    iron  deficiency   Arthritis    Asthma    uses inhaler and nebulizers   Atrial fibrillation (HCC)    Bruises easily    d/t  prednisone  daily   Cataract    Cellulitis 03/22/2020   Chest wall pain 09/21/2019   L scapula, reported 4/27 21/     CHF (congestive heart failure) (HCC)    dx-on meds to treat   Chronic bronchitis (HCC)    used to get it q yr; last time ?2008 (04/28/2012)   Clotting disorder (HCC) 2019   On blood thinner when I bleed it is for a consideral amount of time. This is tegardless of size of wound.   COPD (chronic obstructive pulmonary disease) (HCC)    agent orange exposure   Degenerative disk disease    everywhere (04/28/2012)   Diverticulitis    Dysrhythmia    afib   Elevated uric acid in blood    takes Allopurinol  daily   Emphysema of lung (HCC)    Enlarged prostate    but not on any meds   GERD (gastroesophageal reflux disease)    takes Protonix  daily   H/O hiatal hernia    Headache    History of colonoscopy    History of pneumonia    a. 2010   Hyperlipidemia    takes Lipitor daily; pt. states he takes a preventive   Hypothyroidism    Joint pain    Joint swelling    Leukocytosis    MSSA bacteremia 03/19/2023   Nausea and vomiting 11/12/2022   Neuromuscular disorder (HCC)    bilat legs, and bilat arms   Neuropathy    Osteoporosis 2012   PAF (paroxysmal atrial fibrillation) (HCC)    a. Dx 04/2012, CHA2DS2VASc = 1 (age);  b. 04/2012 Echo: EF 40-50%, mild MR.   Personal history of colonic adenomas 02/09/2013   Pneumonia    Sepsis (HCC) 03/18/2023   Shortness of breath dyspnea    Thyroid  disease    no taking meds-caused dizziness         Patient Active Problem List   Diagnosis Date Noted   Coping style affecting medical condition 11/24/2023   S/P BKA (below knee amputation) unilateral, right (HCC) 11/16/2023   Right foot infection 11/08/2023   Ulcerated, foot, right, with fat layer exposed (HCC) 10/08/2023   Blood blister 09/04/2023   Osteomyelitis of fifth toe of right foot (HCC) 08/13/2023   Osteomyelitis (HCC) 08/13/2023   Skin ulcer of second toe of left  foot with necrosis of bone (HCC) 08/12/2023   Osteomyelitis of second toe of left foot (HCC) 08/12/2023   Hematochezia 07/25/2023   Pain in toe of right fifth foot 06/27/2023   Cellulitis 04/17/2023   Closed fracture of ramus of right pubis (HCC) 04/07/2023   S/P PICC central line placement 03/31/2023   Venous insufficiency of both lower extremities 03/20/2023   Sepsis (HCC) 03/18/2023   Chronic superficial venous thrombosis of both lower extremities 11/18/2022   Venous stasis ulcer (HCC) 09/13/2022   Primary osteoarthritis of right shoulder, status post left shoulder arthroplasty 07/05/2022   Shingles 05/16/2022   Vertigo 04/04/2022   BPH (benign prostatic hyperplasia) 02/21/2022   Annual physical exam 01/22/2022   Charcot's joint of  foot, right 01/22/2022   Lower extremity edema 08/08/2021   Heartburn    Atypical chest pain    Cervical radicular pain 06/11/2021   Chronic anticoagulation 03/15/2021   Peripheral neuropathy 02/22/2021   Chronic systolic heart failure (HCC) 02/22/2021   Essential (primary) hypertension 11/15/2020   Arthritis of carpometacarpal (CMC) joint of right thumb 08/07/2020   COPD (chronic obstructive pulmonary disease) (HCC) 03/21/2020   GERD (gastroesophageal reflux disease) status post Nissen fundoplication 12/03/2017   Lumbar stenosis status post T12-S1 fusion 04/07/2017   Cervical spondylosis with radiculopathy 01/01/2017   Cervical radiculopathy 04/11/2016   History of total right hip arthroplasty 07/31/2015   Chronic low back pain 10/13/2014   Primary osteoarthritis of right knee 06/17/2014   Thrush of mouth and esophagus (HCC) 04/03/2014   Hyperlipidemia    History of colonic polyps 02/09/2013   Paroxysmal atrial fibrillation (HCC) 05/11/2012   Osteoarthritis of left knee 01/10/2012   Seasonal and perennial allergic rhinitis 07/23/2010   Asthma, moderate persistent 03/06/2009    Behavioral Observation/Mental Status:   Levi [name]  presents  as a 78 y.o.-year-old Right handed Caucasian Male who appeared his stated age. his dress was Appropriate and he was Well Groomed and his manners were Appropriate to the situation.  his participation was indicative of Appropriate behaviors.  There were physical disabilities noted.  he displayed an appropriate level of cooperation and motivation.    Interactions:    Active Appropriate  Attention:   within normal limits and attention span and concentration were age appropriate  Memory:   within normal limits; recent and remote memory intact  Visuo-spatial:   not examined  Speech (Volume):  normal  Speech:   normal; normal  Thought Process:  Coherent and Relevant  Coherent, Linear, and Logical  Though Content:  WNL; not suicidal and not homicidal  Orientation:   person, place, time/date, and situation  Judgment:   Good  Planning:   Fair  Affect:    Appropriate  Mood:    Dysphoric  Insight:   Good  Intelligence:   normal  Psychiatric History:  No prior psychiatric history  Family Med/Psych History:  Family History  Problem Relation Age of Onset   Diabetes Mother    Heart disease Father        Died 35, MI   Cancer Father    Hyperlipidemia Father    Stroke Father    Prostate cancer Brother    Colon polyps Brother    Colon polyps Brother    Asthma Brother    COPD Brother    Colon cancer Brother        Dx age 31   Cancer Brother    Colon cancer Other    Esophageal cancer Neg Hx    Rectal cancer Neg Hx    Stomach cancer Neg Hx    Pancreatic cancer Neg Hx    Kidney disease Neg Hx    Liver disease Neg Hx    Neuropathy Neg Hx     Impression/DX:   Levi [name] is a 78 year old male referred for neuropsychological consultation during his ongoing admission to the comprehensive inpatient rehabilitation unit due to coping and adjustment issues with recent BKA and multiple health issues.  Patient is coping with recent loss of limb after extensive efforts to salvage his  right foot.  Patient has a past medical history including PAF on Eliquis , hypertension, hyperlipidemia, COPD, BPH, GERD, anemia and past agent orange exposure.  Patient has had several  orthopedic interventions to salvage his foot and also has previous diagnosis of Charcot right foot.  Patient presented on 11/08/2023 to ED after presyncopal/fall and identified osteomyelitis.  Patient was tachycardic and tachypneic in the ED.  Patient started on IV antibiotics and admitted with MRI right foot on 11/08/2023 confirming osteomyelitis and Charcot degeneration.  Patient underwent right BKA on 11/14/2023.  Patient admitted to CIR due to functional declines after his BKA.  During today's clinical visit the patient was oriented x 4 with good cognition.  Patient's wife was also present in the room.  Patient's wife noted that the patient has been very sedentary for quite some time due to severe pain and other complications with his right foot.  Patient notes his motivation to try to regain functional mobility and be ready for prosthetic fitting and 3-4 months.  Patient denied any significant mood disturbance keeping him from being an active participant in therapeutic interventions.  Patient's wife is able to give a lot of support and had specific questions about what she may be needed to do.  I focused discussions today about expectations and how to cope with the sudden changes in functional status.  Coping and adjustment issues were the primary focus of today's visit.  Diagnosis:    Coping styles potentially impacting medical condition with recent right BKA.         Electronically Signed   _______________________ Norleen Asa, Psy.D. Clinical Neuropsychologist

## 2023-11-24 NOTE — Progress Notes (Addendum)
 Nutrition Follow-up  DOCUMENTATION CODES:   Non-severe (moderate) malnutrition in context of chronic illness  INTERVENTION:   Continue Juven 1 packet po BID, each packet provides 80 calories, 8 grams of carbohydrate, 2.5  grams of protein (collagen), 7 grams of L-arginine and 7 grams of L-glutamine; supplement contains CaHMB, Vitamins C, E, B12 and Zinc  to promote wound healing. MVI with minerals po once daily. Continue Prosource Plus 30 ml PO BID, each packet provides 100 kcal and 15 gm protein Encourage intake of meals and supplements to support healing. Add HS snack daily.   NUTRITION DIAGNOSIS:   Moderate Malnutrition related to chronic illness (COPD) as evidenced by mild muscle depletion, mild fat depletion.  Ongoing   GOAL:   Patient will meet greater than or equal to 90% of their needs  Progressing   MONITOR:   PO intake, Supplement acceptance, Skin  REASON FOR ASSESSMENT:   Consult Wound healing  ASSESSMENT:   78 yo male admitted with functional decline s/p R BKA 11/14/23. PMH includes PAF, CHF, hypothyroidism, HLD, COPD, agent orange exposure, BPH, GERD, anemia.  Remains on a heart healthy diet; meal intakes 75-100% for the past week. He is taking Prosource Plus BID and Juven BID. He does not like the Ensure supplements, which have been discontinued. Educated patient and family on the importance of adequate protein intake to support wound healing. He would like a bedtime snack, RD to order.   Labs reviewed. K 3.3 CBG: 130 (6/29)  Medications reviewed and include vitamin B12 injections 1,000 mcg weekly, fergon, lasix , MVI with minerals, Juven, protonix , klor-con , prednisone , flomax .  CIR admit weight: 90.2 kg Current weight: 94.1 kg   Patient meets criteria for moderate malnutrition, given mild depletion of muscle and subcutaneous fat mass.  NUTRITION - FOCUSED PHYSICAL EXAM:  Flowsheet Row Most Recent Value  Orbital Region Mild depletion  Upper Arm  Region Mild depletion  Thoracic and Lumbar Region Mild depletion  Buccal Region Mild depletion  Temple Region Mild depletion  Clavicle Bone Region Mild depletion  Clavicle and Acromion Bone Region No depletion  Scapular Bone Region No depletion  Dorsal Hand Mild depletion  Patellar Region Unable to assess  Anterior Thigh Region Unable to assess  Posterior Calf Region Unable to assess  Edema (RD Assessment) Unable to assess  Hair Reviewed  Eyes Reviewed  Mouth Reviewed  Skin Reviewed  Nails Reviewed    Diet Order:   Diet Order             Diet Heart Room service appropriate? Yes; Fluid consistency: Thin  Diet effective now                   EDUCATION NEEDS:   Education needs have been addressed  Skin:  Skin Assessment: Skin Integrity Issues: Skin Integrity Issues:: Wound VAC Wound Vac: R BKA 11/14/23  Last BM:  6/29 type 6  Height:   Ht Readings from Last 1 Encounters:  11/16/23 6' 2 (1.88 m)    Weight:   Wt Readings from Last 1 Encounters:  11/24/23 94.1 kg    Ideal Body Weight:  80.8 kg (adjusted for BKA)  BMI:  Body mass index is 28.47 kg/m (adjusted for BKA)  Estimated Nutritional Needs:   Kcal:  2100-2300  Protein:  125-140 gm  Fluid:  2.1-2.3 L   Suzen HUNT RD, LDN, CNSC Contact via secure chat. If unavailable, use group chat RD Inpatient.

## 2023-11-24 NOTE — Progress Notes (Signed)
 PROGRESS NOTE   Subjective/Complaints: K 3.3, BUN 27, other labs stable No acute complaints.  No events overnight. Vitals are stable. Patient with occasional phantom limb sensations, but no pain or discomfort.  Remains in wound VAC.  ROS: Patient denies fevers, N/V/D/C shortness of breath or chest pain, headache, or mood change. + muscle spasms-improved + chronic R shoulder pain- continued  Objective:   No results found. Recent Labs    11/24/23 0513  WBC 9.3  HGB 9.5*  HCT 29.8*  PLT 385    Recent Labs    11/24/23 0513  NA 141  K 3.3*  CL 105  CO2 25  GLUCOSE 84  BUN 27*  CREATININE 0.61  CALCIUM  8.8*    Intake/Output Summary (Last 24 hours) at 11/24/2023 0754 Last data filed at 11/24/2023 0454 Gross per 24 hour  Intake 794 ml  Output 1600 ml  Net -806 ml        Physical Exam: Vital Signs Blood pressure 137/68, pulse 84, temperature 97.8 F (36.6 C), temperature source Oral, resp. rate 18, height 6' 2 (1.88 m), weight 94.1 kg, SpO2 98%.   Constitution: No acute distress, sitting up in bedside chair. Resp: CTAB, on RA Cardio: RRR Abdomen: Nondistended. Nontender.  + Bowel sounds Psych: Appropriate mood and affect. Pleasant Skin: BKA site with wound VAC, with black foam; well adhered, no output noted in canister, limb protector in place--unchanged  Left foot with dry, flaking skin throughout; soft, boggy heels without apparent breakdown, Blanchable areas of erythema on top of 1st and 5th toes--not examined 6/30 Left upper extremity skin tear   Neuro: AAOx4. No apparent cognitive deficits  Sensory exam: revealed normal sensation in all dermatomal regions in bilateral upper extremities and with reduced sensation to light touch in distal left lower extremity Motor exam: strength 5/5 throughout bilateral upper extremities, bilateral lower extremities, and with exception of right shoulder 4/5    MSK:  Limited range of motion of right shoulder, full range of motion in right hip extension/flexion. Right lower extremity BKA, in extension brace and shrinker sock.  Well-formed, minimal edema, full range of motion in flexion and extension. Right shoulder limited in flexion, abduction to less than 100 degrees.  No TTP. Pain with shoulder abduction, internal and external rotation.  Left foot intoeing with flattened arch; ankle dorsiflexion passive range of motion just to neutral.  Prior second toe amputation.  Assessment/Plan: 1. Functional deficits which require 3+ hours per day of interdisciplinary therapy in a comprehensive inpatient rehab setting. Physiatrist is providing close team supervision and 24 hour management of active medical problems listed below. Physiatrist and rehab team continue to assess barriers to discharge/monitor patient progress toward functional and medical goals  Care Tool:  Bathing    Body parts bathed by patient: Right arm, Left arm, Chest, Abdomen, Right upper leg, Left upper leg, Face, Front perineal area   Body parts bathed by helper: Buttocks, Right upper leg Body parts n/a: Right lower leg   Bathing assist Assist Level: Moderate Assistance - Patient 50 - 74%     Upper Body Dressing/Undressing Upper body dressing   What is the patient wearing?: Pull over shirt  Upper body assist Assist Level: Minimal Assistance - Patient > 75%    Lower Body Dressing/Undressing Lower body dressing      What is the patient wearing?: Pants, Incontinence brief     Lower body assist Assist for lower body dressing: Total Assistance - Patient < 25%     Toileting Toileting    Toileting assist Assist for toileting: Total Assistance - Patient < 25%     Transfers Chair/bed transfer  Transfers assist  Chair/bed transfer activity did not occur: Safety/medical concerns  Chair/bed transfer assist level: Minimal Assistance - Patient > 75%      Locomotion Ambulation   Ambulation assist   Ambulation activity did not occur: Safety/medical concerns          Walk 10 feet activity   Assist  Walk 10 feet activity did not occur: Safety/medical concerns        Walk 50 feet activity   Assist Walk 50 feet with 2 turns activity did not occur: Safety/medical concerns         Walk 150 feet activity   Assist Walk 150 feet activity did not occur: Safety/medical concerns         Walk 10 feet on uneven surface  activity   Assist Walk 10 feet on uneven surfaces activity did not occur: Safety/medical concerns         Wheelchair     Assist Is the patient using a wheelchair?: Yes Type of Wheelchair: Manual Wheelchair activity did not occur: Safety/medical concerns  Wheelchair assist level: Minimal Assistance - Patient > 75% Max wheelchair distance: 50    Wheelchair 50 feet with 2 turns activity    Assist    Wheelchair 50 feet with 2 turns activity did not occur: Safety/medical concerns   Assist Level: Minimal Assistance - Patient > 75%   Wheelchair 150 feet activity     Assist  Wheelchair 150 feet activity did not occur: Safety/medical concerns   Assist Level: Dependent - Patient 0%   Blood pressure 137/68, pulse 84, temperature 97.8 F (36.6 C), temperature source Oral, resp. rate 18, height 6' 2 (1.88 m), weight 94.1 kg, SpO2 98%.  Medical Problem List and Plan: 1. Functional deficits secondary to R BKA d/t osteomyelitis 11/14/23 by Dr. Harden, hx of charcot joint             -patient may not shower until wound vac removed             -ELOS/Goals: 16-18 days              -f/up with Dr. Harden 1wk after d/c, continue prevena plus wound vac             - Continue CIR  -Expected discharge 7/11  -Dr. Harden note indicates plan for wound vac at discharge for 1 week,   DC wound vac order placed 6/29   2.  Antithrombotics: -DVT/anticoagulation:  Pharmaceutical: Eliquis  5mg  BID              -antiplatelet therapy: n/a 3. Pain Management/DDD/neuropathy: tylenol  and oxycodone  PRN, flexeril  PRN, voltaren  PRN, Lyrica  100mg  BID, Fioricet  PRN  -6/24 Advised trying PRN flexeril  for muscle spasms  -6/25 used flexeril  last night, pain is controlled, continue current regimen  -6/27 occasional use of flexeril , continue current. Pain controlled overall, could consider trial decrease lyrica  since pain doing well  6/28 not requiring recent flexeril , pain overall controlled  6/29 Voltaren  for right shoulder pain scheduled  4. Mood/Behavior/Sleep: No concerns             -  antipsychotic agents: n/a 5. Neuropsych/cognition: This patient is capable of making decisions on his own behalf. 6. Skin/Wound Care: routine skin checks, continue wound vac R BKA, wound care LUE wound -Add Eucerin cream to left foot daily 6/30: Plan to remove wound VAC today as above   7. Fluids/Electrolytes/Nutrition/hypokalemia/hyponatremia: routine I&O, routine labs, continue vitamins/supplements, continue Kdur 20mEq daily  -6/23 give additional 20 mEq of potassium today  6/25 K+ up to 3.7, continue supplementation and monitor  6/27 give additional 20meq today for K+ 3.4  6/30: K 3.3; potassium chloride  increased from 20 mill equivalents daily to 40 meq daily   8. PAF on Eliquis  and Diltiazem  180mg  daily  - Heart rate controlled 9. HFpEF (EF 40-45% 02/2023): resume home Lasix  40mg  daily, daily weights-ordered Filed Weights   11/22/23 0500 11/23/23 0500 11/24/23 0452  Weight: 89.5 kg 87.2 kg 94.1 kg  -6/25 wt a little lower, monitor tend, decrease lasix  to 20mg  daily, Cr was a little higher at 0.71/BUN 16, recheck friday -6/29 No signs of overload, continue to monitor weight and recheck labs tomorrow 6/30: unreliable; no external signs of volume overload.  Continue current regimen.  10. HTN: Continue Diltiazem  as above, home Imdur  on hold, resume as appropriate    11/24/2023    4:54 AM 11/24/2023    4:52 AM  11/23/2023    7:58 PM  Vitals with BMI  Weight  207 lbs 7 oz   BMI  26.62   Systolic 137  121  Diastolic 68  74  Pulse 84  91  6/24-29, controlled, monitor   11. HLD: continue Lipitor 20mg  daily 12. COPD/agent orange exposure: continue chronic Prednisone  20mg  daily, continue Brovana /Pulmicort  nebs, Mucinex  PRN, Duonebs PRN 13. BPH: continue Flomax  0.4mg  nightly 14. GERD: continue Protonix  40mg  BID, Maalox PRN 15. ?Gout/elevated uric acid: home med Allopurinol  100mg  qAM on hold, restart as appropriate.  16. Anemia, acute on chronic: Hgb 9-10 range during acute stay, monitor labs  -6/23 stable continue to monitor  -6/25 HGB stable at 10.3  Recheck tomorrow  6/30: HgB 9.5; stable  17. Vitamin B12 deficiency: 1000mcg SQ weekly ordered during admission, f/up on transition to oral per primary team 18. Constipation: LBM day of admission, continue Colace 100mg  BID and Miralax  daily  - 6/25 LBM today, continue to monitor   -6/26 discontinue Colace and change MiraLAX  to as needed  -6/29 LBM yesterday, continue to monitor    LOS: 8 days A FACE TO FACE EVALUATION WAS PERFORMED  Levi [name] Likes 11/24/2023, 7:54 AM

## 2023-11-24 NOTE — Plan of Care (Signed)
  Problem: Consults Goal: RH LIMB LOSS PATIENT EDUCATION Description: Description: See Patient Education module for eduction specifics. Outcome: Progressing Goal: Skin Care Protocol Initiated - if Braden Score 18 or less Description: If consults are not indicated, leave blank or document N/A Outcome: Progressing   Problem: RH BOWEL ELIMINATION Goal: RH STG MANAGE BOWEL WITH ASSISTANCE Description: STG Manage Bowel with toileting Assistance. Outcome: Progressing Goal: RH STG MANAGE BOWEL W/MEDICATION W/ASSISTANCE Description: STG Manage Bowel with Medication with mod I Assistance. Outcome: Progressing   Problem: RH BLADDER ELIMINATION Goal: RH STG MANAGE BLADDER WITH ASSISTANCE Description: STG Manage Bladder With  toileting Assistance Outcome: Progressing Goal: RH STG MANAGE BLADDER WITH MEDICATION WITH ASSISTANCE Description: STG Manage Bladder With Medication With mod I Assistance. Outcome: Progressing   Problem: RH SKIN INTEGRITY Goal: RH STG MAINTAIN SKIN INTEGRITY WITH ASSISTANCE Description: STG Maintain Skin Integrity With min Assistance. Outcome: Progressing Goal: RH STG ABLE TO PERFORM INCISION/WOUND CARE W/ASSISTANCE Description: STG Able To Perform Incision/Wound Care With min Assistance. Outcome: Progressing   Problem: RH SAFETY Goal: RH STG ADHERE TO SAFETY PRECAUTIONS W/ASSISTANCE/DEVICE Description: STG Adhere to Safety Precautions With cues Assistance/Device. Outcome: Progressing Goal: RH STG DECREASED RISK OF FALL WITH ASSISTANCE Description: STG Decreased Risk of Fall With cues Assistance. Outcome: Progressing   Problem: RH PAIN MANAGEMENT Goal: RH STG PAIN MANAGED AT OR BELOW PT'S PAIN GOAL Description: < 4 with prns Outcome: Progressing   Problem: RH KNOWLEDGE DEFICIT LIMB LOSS Goal: RH STG INCREASE KNOWLEDGE OF SELF CARE AFTER LIMB LOSS Description: Patient and wife will be able to manage care at discharge using educational resources for medication,  skin care, dietary ,modification independently Outcome: Progressing

## 2023-11-24 NOTE — Progress Notes (Signed)
 Occupational Therapy Weekly Progress Note  Patient Details  Name: Levi [name] MRN: [id number] Date of Birth: 19-Feb-1946  Beginning of progress report period: November 17, 2023 End of progress report period: November 24, 2023  Session 1: Today's Date: 11/24/2023 OT Individual Time: 9054-8899 OT Individual Time Calculation (min): 75 min   Session 2:  Today's Date: 11/24/2023 OT Individual Time: 1300-1416 OT Individual Time Calculation (min): 76 min   Patient has met 2 of 4 short term goals.  Patient is requiring assist for limb protector placement and standing requiring increased assistance. Patient requires assistance for slide board placement, CG for pants and shirt, mod A for limb protector, max A for toileitng, mod to max A for bathing.   Patient continues to demonstrate the following deficits: muscle weakness, decreased cardiorespiratoy endurance, and decreased sitting balance, decreased standing balance, decreased postural control, and decreased balance strategies and therefore will continue to benefit from skilled OT intervention to enhance overall performance with BADL and Reduce care partner burden.  Patient progressing toward long term goals..  Continue plan of care.  OT Short Term Goals Week 1:  OT Short Term Goal 1 (Week 1): Pt will transfer to the toilet/ BSC with mod A +1 OT Short Term Goal 1 - Progress (Week 1): Met OT Short Term Goal 2 (Week 1): Pt will be able to perform sit to stands in prep for ADL tasks with mod A +1 consistenly OT Short Term Goal 2 - Progress (Week 1): Progressing toward goal OT Short Term Goal 3 (Week 1): Pt will perform LB dressing with AEprn with mod A OT Short Term Goal 3 - Progress (Week 1): Met OT Short Term Goal 4 (Week 1): Pt will perform bed mobility in prep for ADL tasks without bed features with supervision OT Short Term Goal 4 - Progress (Week 1): Progressing toward goal Week 2:  OT Short Term Goal 1 (Week 2): (P) patient will complete slide  board transfers to wc, bed, and mat table with min A including placement on slide board OT Short Term Goal 2 (Week 2): (P) patient will complete toileting on bsc over toilet with modA  Session 1: Skilled Therapeutic Interventions/Progress Updates:  Patient agreeable to participate in OT session. Reports 5/10 pain level in L shoulder.   Patient participated in skilled OT session focusing on dynamic balance, UE strengthening, core strengthening, upright positioning/ postural alignment, and standing tolerance. Patient completed functional mobility with RW for 50 ft with increased speed. Patient completed standing in parallel bars with mod verbal cues for proper positioning and safety. Patient able to complete sit to stands x5 with increased time for rest due to fatigue, requiring max A to stand. Patient then completed UE strengthening  Therapist facilitated increased UE strengthening utilizing dowel rod #3, dumbell #5, and weighted ball #5 to increase strength in UE to promote increased efficiency with standing, wc mobility, and slide board transfers to increase BSC use. Patient able to complete dynamic balance activities utilizing weighted ball unsupported sitting for increased core strength and postural control. OT educated on postural alignment to increase safety and performance with functional activities. Patient returned to room completing wc mobility 100 ft with increased time.   Session 2: Skilled Therapeutic Interventions/Progress Updates:  Patient agreeable to participate in OT session. Reports 4/10 pain level.   Patient participated in skilled OT session focusing on ADL tolerance, toileitng, transfers, and functional activity tolerance. Therapist facilitated slide board transfer to toilet, LB doffing and donning of clothes  and peri hygiene to increase continence of bowel and bladder, and ability to complete toileting on BSC/ toilet. Patient demonstrated good ability to slide board with toilet  with max A for slide board set up, sliding CGA. Patient able to complete doffing cga with mod verbal cues.Peri hygiene min A for thoroughness utilizing leaning on BSC.  Donning and with max A with standing to don mod A utilizing grab bars from elevated commode height. Patient able to complete UBE NuSTep to increase UE strength needed for wc mobility and transfers. Patient reported increased fatigue following session. All needs met, alarm left in reach.   Therapy Documentation Precautions:  Precautions Precautions: Fall Recall of Precautions/Restrictions: Intact Other Brace: RLE ampushield Restrictions Weight Bearing Restrictions Per Provider Order: Yes RLE Weight Bearing Per Provider Order: Non weight bearing Other Position/Activity Restrictions: no pillow under R knee Therapy/Group: Individual Therapy  D'mariea L Ausha Sieh OTR/L  11/24/2023, 8:55 AM

## 2023-11-24 NOTE — Progress Notes (Incomplete)
 Physical Therapy Weekly Progress Note  Patient Details  Name: Levi [name] MRN: [id number] Date of Birth: November 28, 1945  Beginning of progress report period: {Time; dates multiple:304500300} End of progress report period: {Time; dates multiple:304500300}   Patient has met {number 1-5:22450} of {number 1-5:20334} short term goals.  ***  Patient continues to demonstrate the following deficits {impairments:3041632} and therefore will continue to benefit from skilled PT intervention to increase functional independence with mobility.  Patient {LTG progression:3041653}.  {plan of rjmz:6958345}  PT Short Term Goals {DUH:6958314}  Skilled Therapeutic Interventions/Progress Updates:       Therapy Documentation Precautions:  Precautions Precautions: Fall Recall of Precautions/Restrictions: Intact Other Brace: RLE ampushield Restrictions Weight Bearing Restrictions Per Provider Order: Yes RLE Weight Bearing Per Provider Order: Non weight bearing Other Position/Activity Restrictions: no pillow under R knee  Therapy/Group: Individual Therapy  [facility] [place], [place], DPT  11/24/2023, 7:53 AM

## 2023-11-24 NOTE — Progress Notes (Signed)
 Physical Therapy Session Note  Patient Details  Name: Levi [name] MRN: [id number] Date of Birth: 03-Sep-1945  Today's Date: 11/24/2023 PT Individual Time: 0911-0946 PT Individual Time Calculation (min): 35 min  PT missed time: 10 min (scheduling conflict)  Short Term Goals: Week 1:  PT Short Term Goal 1 (Week 1): Pt will be minA for sit to stand PT Short Term Goal 2 (Week 1): Pt will be minA for bed mobility PT Short Term Goal 3 (Week 1): Pt will start wc mobility PT Short Term Goal 4 (Week 1): Pt will be modA for stand pivot transfer  Skilled Therapeutic Interventions/Progress Updates:      Treatment Session 1  Pt supine on bed pan upon arrival. Pt agreeable to therapy. Pt reports pain in L shoulder and neck, requesting pain medicine, notified nurse, nurse present to administer medication.    Pt continent of bowel in bed pan. Pt performed pericare and donning/doffing brief with total A.   Pt donned pants while seated EOB with supervision, verbal cues provided for correction of posterior bias with emphasis on anterior weight shift.   Bed mobility: rolling with use of bed features and supervision. Supine to sit, sit to supine.   Transfers: pt performed slide board transfer bed to [facility] with therapist placing slide board, pt able to remove slide board with increased time, verbal cues provided for technique.   Education provided for technique and frequency pressure relief in bed and in WC. Pt and wife provided teach back.   Pt able to sustain WC push up for 17 seconds. Pt able to sustain lateral trunk lean B in WC for >30 seconds.   Discussed insurance coverage for power WC versus prosthetic, therapist answered pt/family questions related to pros and cons of each. Pt and wife plan to continue with prosthetic.   Pt with OT at end of session.      Therapy Documentation Precautions:  Precautions Precautions: Fall Recall of Precautions/Restrictions: Intact Other Brace: RLE  ampushield Restrictions Weight Bearing Restrictions Per Provider Order: Yes RLE Weight Bearing Per Provider Order: Non weight bearing Other Position/Activity Restrictions: no pillow under R knee  Therapy/Group: Individual Therapy  [facility] [place], [place], DPT  11/24/2023, 12:19 PM

## 2023-11-25 ENCOUNTER — Inpatient Hospital Stay (HOSPITAL_COMMUNITY)

## 2023-11-25 DIAGNOSIS — M25511 Pain in right shoulder: Secondary | ICD-10-CM

## 2023-11-25 NOTE — Progress Notes (Signed)
 Occupational Therapy Session Note  Patient Details  Name: Levi [name] MRN: [id number] Date of Birth: 11-Feb-1946  Session 1: Today's Date: 11/25/2023 OT Individual Time: 9151-8998 OT Individual Time Calculation (min): 73 min   Session 2: Today's Date: 11/25/2023 OT Individual Time: 8699-8584 OT Individual Time Calculation (min): 75 min   Short Term Goals: Week 1:  OT Short Term Goal 1 (Week 1): Pt will transfer to the toilet/ BSC with mod A +1 OT Short Term Goal 1 - Progress (Week 1): Met OT Short Term Goal 2 (Week 1): Pt will be able to perform sit to stands in prep for ADL tasks with mod A +1 consistenly OT Short Term Goal 2 - Progress (Week 1): Progressing toward goal OT Short Term Goal 3 (Week 1): Pt will perform LB dressing with AEprn with mod A OT Short Term Goal 3 - Progress (Week 1): Met OT Short Term Goal 4 (Week 1): Pt will perform bed mobility in prep for ADL tasks without bed features with supervision OT Short Term Goal 4 - Progress (Week 1): Progressing toward goal Week 2:  OT Short Term Goal 1 (Week 2): (P) patient will complete slide board transfers to wc, bed, and mat table with min A including placement on slide board OT Short Term Goal 2 (Week 2): (P) patient will complete toileting on bsc over toilet with modA  Session 1: Skilled Therapeutic Interventions/Progress Updates:  Patient agreeable to participate in OT session. Reports 6/10 pain level.   Patient participated in skilled OT session focusing on LB limb protector dressing, and slide board transfers with complete independence. Patient able to complete limb protector dressing with max verbal cues for placement. Patient demonstrated ability to don with min A with practice due to limitations in shoulder ROM and bending. Patient educated on slide board transfer placement and slide boarding with increased safety. Patient able to place slide board and complete transfer with CGA for safety with no cues. Patient completed  slide board transfer to and from bed with several different inclines and declines to increase efficiency safety and independence with slide board transfers. Patient required therapeutic rest breaks and education on importance of rest to increase safety with transfers.    Session 2: Skilled Therapeutic Interventions/Progress Updates:  Patient agreeable to participate in OT session. Reports 7/10 in shoulders pain level.   Patient participated in skilled OT session focusing on transfers with caregiver training, toileting independence, home set up/ barriers. Therapist facilitated increased independence with placement of slide board and slide board transfers utilizing teach back method to educate patients spouse. Patient able to place and complete slide board transfer with mod verbal cues and CGA for safety. Patient completed slide board transfer to slick surface using dycem to assist with safety. Patient requested to complete toileting. Patient educated and completed toilet transfer. Patient minA for slide board set up CGA for transfer. Patient MaxA for toileting due to clothing management. Peri hygiene completed minA due to need for thoroughness with cleaning bowel movement. Spouse educated on equipment needs, transfer, and safety techniques in order to improve safety for both patient and caregiver in home. Increased time required for toileting.     Therapy Documentation Precautions:  Precautions Precautions: Fall Recall of Precautions/Restrictions: Intact Other Brace: RLE ampushield Restrictions Weight Bearing Restrictions Per Provider Order: Yes RLE Weight Bearing Per Provider Order: Non weight bearing Other Position/Activity Restrictions: no pillow under R knee  Therapy/Group: Individual Therapy  D'mariea L Tyrome Donatelli OTR/L  11/25/2023, 7:54 AM

## 2023-11-25 NOTE — Progress Notes (Signed)
 Physical Therapy Weekly Progress Note  Patient Details  Name: Levi [name] MRN: [id number] Date of Birth: [birth year]  Beginning of progress report period: November 17, 2023 End of progress report period: November 25, 2023  Today's Date: 11/25/2023 PT Individual Time: 8584-8471 PT Individual Time Calculation (min): 73 min   Patient has met 2 of 4 short term goals.  Pt is completing bed mobility with supervision with use of bed features, WC mobility with supervision 150+ feet with use of B UE/L LE. Slide board transfer with supervision. Pt requires +2 A for squat pivot transfer, and sit to stand in // bars. Pt unsafe to ambulate at this time 2/2 strength/balance/ROM deficits. Discontinued ambulation/stair goals 2/2 not anticipating pt to be functional Ambulator upon discharge. Pt has ramped entrance. D/C scheduled for 7/11.   Patient continues to demonstrate the following deficits muscle weakness and muscle joint tightness, decreased cardiorespiratoy endurance, and decreased sitting balance, decreased standing balance, decreased postural control, decreased balance strategies, and difficulty maintaining precautions and therefore will continue to benefit from skilled PT intervention to increase functional independence with mobility.  Patient not progressing toward long term goals.  See goal revision..  Plan of care revisions: 11/25/23.  PT Short Term Goals Week 1:  PT Short Term Goal 1 (Week 1): Pt will be minA for sit to stand PT Short Term Goal 1 - Progress (Week 1): Revised due to lack of progress PT Short Term Goal 2 (Week 1): Pt will be minA for bed mobility PT Short Term Goal 2 - Progress (Week 1): Met PT Short Term Goal 3 (Week 1): Pt will start wc mobility PT Short Term Goal 3 - Progress (Week 1): Met PT Short Term Goal 4 (Week 1): Pt will be modA for stand pivot transfer PT Short Term Goal 4 - Progress (Week 1): Revised due to lack of progress Week 2:  PT Short Term Goal 1 (Week 2): pt caregiver  will complete and return home measurement sheet PT Short Term Goal 2 (Week 2): pt will perform sit to stand with LRAD and mod A  Skilled Therapeutic Interventions/Progress Updates:      Pt seated in WC upon arrival. Pt agreeable to therapy. Pt denies any pain.   Attempted sit to stand from [facility] level with +2 A, however discontinued for safety 2/2 severe posterior bias.   Performed sit to stand in // bars with +2 A, verbal and tactile cues provided for anterior weight shift, L LE knee extension, and hip/trunk extension.   Pt positioned WC next to mat table with min A, verbal/tactile cues provided for technique/sequencing. Pt demonstrates improved carry over with repetition.   Pt locked/unlocked brakes with supervision, removed arm rests with supervision (verbal cues provided for locating and technique), and placed and removed slide board with supervision, max verbal cues provided for lateral trunk lean technique and proper placement of board (pt very fatigued during session 2/2 previous sessions). Pt required assist for management of elevating leg rests.   Pt demonstrates poor carry over of pressure relief techniques while seated in WC. Pt performed lateral trunk lean technique each direction for 30 seconds. Reiterated purpose of pressure relief, importance and frequency.   Pt self propelled WC 150+ feet with B UE, and L LE with supervision and increased time, verbal cues provided for L LE positioning and technique with navigating turns/obstacle negotiation.   Pt performed bed mobility with supervision, verbal cues provided for technique.   Pt doffed pants while supine (for  removal of wound vac) with min A with use of rolling, and bridging technique.   Pt supine in bed with all needs within reach and nurse in room for wound vac removal.   Therapy Documentation Precautions:  Precautions Precautions: Fall Recall of Precautions/Restrictions: Intact Other Brace: RLE  ampushield Restrictions Weight Bearing Restrictions Per Provider Order: Yes RLE Weight Bearing Per Provider Order: Non weight bearing Other Position/Activity Restrictions: no pillow under R knee  Therapy/Group: Individual Therapy  [facility] Doreene Orris, [place], DPT  11/25/2023, 7:58 AM

## 2023-11-25 NOTE — Progress Notes (Addendum)
 PROGRESS NOTE   Subjective/Complaints:  Still has wound vac in place. Continues to have R shoulder pain with activities.  Reports mild benefit with Voltaren  gel. Seen by neuropsych yesterday.  ROS: Patient denies fevers, rash, N/V/D/C shortness of breath or chest pain, headache, or mood change. + muscle spasms-improved + chronic R shoulder pain- continued  Objective:   No results found. Recent Labs    11/24/23 0513  WBC 9.3  HGB 9.5*  HCT 29.8*  PLT 385    Recent Labs    11/24/23 0513  NA 141  K 3.3*  CL 105  CO2 25  GLUCOSE 84  BUN 27*  CREATININE 0.61  CALCIUM  8.8*    Intake/Output Summary (Last 24 hours) at 11/25/2023 1244 Last data filed at 11/25/2023 0730 Gross per 24 hour  Intake 840 ml  Output 1050 ml  Net -210 ml        Physical Exam: Vital Signs Blood pressure (!) 140/68, pulse 71, temperature 98.8 F (37.1 C), temperature source Oral, resp. rate 18, height 6' 2 (1.88 m), weight 88.2 kg, SpO2 100%.   Constitution: No acute distress, sitting in bed appears comfortable Resp: CTAB, nonlabored breathing, on RA Cardio: RRR Abdomen: Nondistended. Nontender.  + Bowel sounds Psych: Appropriate mood and affect. Pleasant Skin: BKA with wound VAC-no drainage in canister noted  Left foot with dry, flaking skin throughout; soft, boggy heels without apparent breakdown, Blanchable areas of erythema on top of 1st and 5th toes--not examined 7/1   Neuro: AAOx4. No apparent cognitive deficits  Sensory exam: Reduced sensation to light touch in distal left lower extremity Motor exam: strength 5/5 throughout bilateral upper extremities, bilateral lower extremities, and with exception of right shoulder 4/5    MSK: Limited range of motion of right shoulder Right shoulder limited in flexion, abduction to less than 100 degrees.  No TTP. Pain with shoulder abduction, internal and external rotation.  Prior second  toe amputation.  Assessment/Plan: 1. Functional deficits which require 3+ hours per day of interdisciplinary therapy in a comprehensive inpatient rehab setting. Physiatrist is providing close team supervision and 24 hour management of active medical problems listed below. Physiatrist and rehab team continue to assess barriers to discharge/monitor patient progress toward functional and medical goals  Care Tool:  Bathing    Body parts bathed by patient: Right arm, Left arm, Chest, Abdomen, Right upper leg, Left upper leg, Face, Front perineal area   Body parts bathed by helper: Buttocks, Right upper leg Body parts n/a: Right lower leg   Bathing assist Assist Level: Moderate Assistance - Patient 50 - 74%     Upper Body Dressing/Undressing Upper body dressing   What is the patient wearing?: Pull over shirt    Upper body assist Assist Level: Minimal Assistance - Patient > 75%    Lower Body Dressing/Undressing Lower body dressing      What is the patient wearing?: Pants, Incontinence brief     Lower body assist Assist for lower body dressing: Total Assistance - Patient < 25%     Toileting Toileting    Toileting assist Assist for toileting: Total Assistance - Patient < 25%     Transfers  Chair/bed transfer  Transfers assist  Chair/bed transfer activity did not occur: Safety/medical concerns  Chair/bed transfer assist level: Minimal Assistance - Patient > 75%     Locomotion Ambulation   Ambulation assist   Ambulation activity did not occur: Safety/medical concerns          Walk 10 feet activity   Assist  Walk 10 feet activity did not occur: Safety/medical concerns        Walk 50 feet activity   Assist Walk 50 feet with 2 turns activity did not occur: Safety/medical concerns         Walk 150 feet activity   Assist Walk 150 feet activity did not occur: Safety/medical concerns         Walk 10 feet on uneven surface  activity   Assist  Walk 10 feet on uneven surfaces activity did not occur: Safety/medical concerns         Wheelchair     Assist Is the patient using a wheelchair?: Yes Type of Wheelchair: Manual Wheelchair activity did not occur: Safety/medical concerns  Wheelchair assist level: Minimal Assistance - Patient > 75% Max wheelchair distance: 50    Wheelchair 50 feet with 2 turns activity    Assist    Wheelchair 50 feet with 2 turns activity did not occur: Safety/medical concerns   Assist Level: Minimal Assistance - Patient > 75%   Wheelchair 150 feet activity     Assist  Wheelchair 150 feet activity did not occur: Safety/medical concerns   Assist Level: Dependent - Patient 0%   Blood pressure (!) 140/68, pulse 71, temperature 98.8 F (37.1 C), temperature source Oral, resp. rate 18, height 6' 2 (1.88 m), weight 88.2 kg, SpO2 100%.  Medical Problem List and Plan: 1. Functional deficits secondary to R BKA d/t osteomyelitis 11/14/23 by Dr. Harden, hx of charcot joint             -patient may not shower until wound vac removed             -ELOS/Goals: 16-18 days              -f/up with Dr. Harden 1wk after d/c, continue prevena plus wound vac             - Continue CIR  -Expected discharge 7/11  -Dr. Harden note indicates plan for wound vac at discharge for 1 week,   DC wound vac order placed 6/29- will message nursing   -Team conference tomorrow    2.  Antithrombotics: -DVT/anticoagulation:  Pharmaceutical: Eliquis  5mg  BID             -antiplatelet therapy: n/a 3. Pain Management/DDD/neuropathy: tylenol  and oxycodone  PRN, flexeril  PRN, voltaren  PRN, Lyrica  100mg  BID, Fioricet  PRN  -6/24 Advised trying PRN flexeril  for muscle spasms  -6/25 used flexeril  last night, pain is controlled, continue current regimen  -6/27 occasional use of flexeril , continue current. Pain controlled overall, could consider trial decrease lyrica  since pain doing well  6/28 not requiring recent flexeril , pain  overall controlled  6/29 Voltaren  for right shoulder pain scheduled  7/1 Xray R shoulder, consider cortisone  4. Mood/Behavior/Sleep: No concerns             -antipsychotic agents: n/a 5. Neuropsych/cognition: This patient is capable of making decisions on his own behalf.  7/1 patient was seen by neuropsychology yesterday for coping and adjustment, appreciate assistance 6. Skin/Wound Care: routine skin checks, continue wound vac R BKA, wound care LUE wound -Add Eucerin  cream to left foot daily 6/30: Plan to remove wound VAC today as above 7/1 Discuss wound vac removal with nursing   7. Fluids/Electrolytes/Nutrition/hypokalemia/hyponatremia: routine I&O, routine labs, continue vitamins/supplements, continue Kdur 20mEq daily  -6/23 give additional 20 mEq of potassium today  6/25 K+ up to 3.7, continue supplementation and monitor  6/27 give additional 20meq today for K+ 3.4  6/30: K 3.3; potassium chloride  increased from 20 mill equivalents daily to 40 meq daily    Recheck level Thursday  8. PAF on Eliquis  and Diltiazem  180mg  daily  - Heart rate controlled 9. HFpEF (EF 40-45% 02/2023): resume home Lasix  40mg  daily, daily weights-ordered Filed Weights   11/23/23 0500 11/24/23 0452 11/25/23 0443  Weight: 87.2 kg 94.1 kg 88.2 kg  -6/25 wt a little lower, monitor tend, decrease lasix  to 20mg  daily, Cr was a little higher at 0.71/BUN 16, recheck friday -6/29 No signs of overload, continue to monitor weight and recheck labs tomorrow 6/30: unreliable; no external signs of volume overload.  Continue current regimen.  10. HTN: Continue Diltiazem  as above, home Imdur  on hold, resume as appropriate    11/25/2023    4:43 AM 11/25/2023    4:42 AM 11/24/2023    8:33 PM  Vitals with BMI  Weight 194 lbs 7 oz    BMI 24.95    Systolic  140   Diastolic  68   Pulse  71 73  7/1 overall controlled continue to monitor  11. HLD: continue Lipitor 20mg  daily 12. COPD/agent orange exposure: continue  chronic Prednisone  20mg  daily, continue Brovana /Pulmicort  nebs, Mucinex  PRN, Duonebs PRN 13. BPH: continue Flomax  0.4mg  nightly 14. GERD: continue Protonix  40mg  BID, Maalox PRN 15. ?Gout/elevated uric acid: home med Allopurinol  100mg  qAM on hold, restart as appropriate.  16. Anemia, acute on chronic: Hgb 9-10 range during acute stay, monitor labs  -6/23 stable continue to monitor  -6/25 HGB stable at 10.3  Recheck tomorrow  6/30: HgB 9.5; stable  17. Vitamin B12 deficiency: 1000mcg SQ weekly ordered during admission, f/up on transition to oral per primary team 18. Constipation: LBM day of admission, continue Colace 100mg  BID and Miralax  daily  - 6/25 LBM today, continue to monitor   -6/26 discontinue Colace and change MiraLAX  to as needed  -7/1 LBM 6/29 recorded, patient denies feeling constipated, consider additional medication tomorrow if no BM    LOS: 9 days A FACE TO FACE EVALUATION WAS PERFORMED  Murray Collier 11/25/2023, 12:44 PM

## 2023-11-25 NOTE — Plan of Care (Signed)
 Revised goals 2/2 pt progressing slower than anticipated.  Problem: RH Ambulation Goal: LTG Patient will ambulate in controlled environment (PT) Description: LTG: Patient will ambulate in a controlled environment, # of feet with assistance (PT). Outcome: Not Applicable Flowsheets (Taken 11/25/2023 1643) LTG: Pt will ambulate in controlled environ  assist needed:: (discontinued as not anticipating pt to be functional ambulator upon discharge) -- Goal: LTG Patient will ambulate in home environment (PT) Description: LTG: Patient will ambulate in home environment, # of feet with assistance (PT). Outcome: Not Applicable Flowsheets (Taken 11/25/2023 1643) LTG: Pt will ambulate in home environ  assist needed:: (discontinued as not anticipating pt to be functional ambulator upon discharge) --   Problem: RH Stairs Goal: LTG Patient will ambulate up and down stairs w/assist (PT) Description: LTG: Patient will ambulate up and down # of stairs with assistance (PT) Outcome: Not Applicable Flowsheets (Taken 11/25/2023 1643) LTG: Pt will ambulate up/down stairs assist needed:: (discontinued as not anticipating pt to be functional ambulator upon discharge, and pt has ramped entrance for home.) --

## 2023-11-25 NOTE — Plan of Care (Signed)
  Problem: Consults Goal: RH LIMB LOSS PATIENT EDUCATION Description: Description: See Patient Education module for eduction specifics. Outcome: Progressing Goal: Skin Care Protocol Initiated - if Braden Score 18 or less Description: If consults are not indicated, leave blank or document N/A Outcome: Progressing   Problem: RH BOWEL ELIMINATION Goal: RH STG MANAGE BOWEL WITH ASSISTANCE Description: STG Manage Bowel with toileting Assistance. Outcome: Progressing Goal: RH STG MANAGE BOWEL W/MEDICATION W/ASSISTANCE Description: STG Manage Bowel with Medication with mod I Assistance. Outcome: Progressing   Problem: RH BLADDER ELIMINATION Goal: RH STG MANAGE BLADDER WITH ASSISTANCE Description: STG Manage Bladder With  toileting Assistance Outcome: Progressing Goal: RH STG MANAGE BLADDER WITH MEDICATION WITH ASSISTANCE Description: STG Manage Bladder With Medication With mod I Assistance. Outcome: Progressing   Problem: RH SKIN INTEGRITY Goal: RH STG MAINTAIN SKIN INTEGRITY WITH ASSISTANCE Description: STG Maintain Skin Integrity With min Assistance. Outcome: Progressing Goal: RH STG ABLE TO PERFORM INCISION/WOUND CARE W/ASSISTANCE Description: STG Able To Perform Incision/Wound Care With min Assistance. Outcome: Progressing   Problem: RH SAFETY Goal: RH STG ADHERE TO SAFETY PRECAUTIONS W/ASSISTANCE/DEVICE Description: STG Adhere to Safety Precautions With cues Assistance/Device. Outcome: Progressing Goal: RH STG DECREASED RISK OF FALL WITH ASSISTANCE Description: STG Decreased Risk of Fall With cues Assistance. Outcome: Progressing   Problem: RH PAIN MANAGEMENT Goal: RH STG PAIN MANAGED AT OR BELOW PT'S PAIN GOAL Description: < 4 with prns Outcome: Progressing   Problem: RH KNOWLEDGE DEFICIT LIMB LOSS Goal: RH STG INCREASE KNOWLEDGE OF SELF CARE AFTER LIMB LOSS Description: Patient and wife will be able to manage care at discharge using educational resources for medication,  skin care, dietary ,modification independently Outcome: Progressing

## 2023-11-26 DIAGNOSIS — R197 Diarrhea, unspecified: Secondary | ICD-10-CM

## 2023-11-26 NOTE — Progress Notes (Signed)
 Patient ID: Levi [name], male   DOB: [birth year], 78 y.o.   MRN: [id number] Met with pt and wife is is present to give team conference update with progress this week in therapies and date still 7/11 to go home. He feels he is getting stronger but still has a ways to go. Discussed home health and equipment will work on this in preparation for discharge.

## 2023-11-26 NOTE — Plan of Care (Signed)
  Problem: Consults Goal: RH LIMB LOSS PATIENT EDUCATION Description: Description: See Patient Education module for eduction specifics. Outcome: Progressing   Problem: RH BOWEL ELIMINATION Goal: RH STG MANAGE BOWEL WITH ASSISTANCE Description: STG Manage Bowel with toileting Assistance. Outcome: Progressing   Problem: RH BLADDER ELIMINATION Goal: RH STG MANAGE BLADDER WITH ASSISTANCE Description: STG Manage Bladder With  toileting Assistance Outcome: Progressing

## 2023-11-26 NOTE — Progress Notes (Signed)
 Occupational Therapy Session Note  Patient Details  Name: Levi [name] MRN: [id number] Date of Birth: October 26, 1945  Today's Date: 11/26/2023 OT Individual Time: 1112-1200 OT Individual Time Calculation (min): 48 min  and Today's Date: 11/26/2023 OT Missed Time: 10 Minutes Missed Time Reason: Other (comment) (Missed minutes due to delay in care)   Short Term Goals: Week 2:  OT Short Term Goal 1 (Week 2): (P) patient will complete slide board transfers to wc, bed, and mat table with min A including placement on slide board OT Short Term Goal 2 (Week 2): (P) patient will complete toileting on bsc over toilet with modA  Skilled Therapeutic Interventions/Progress Updates:  Pt greeted completing toileting on [facility], skilled OT session with focus on toileting, functional transfers, and general conditioning.   Pain: Pt with un-rated residual limb pain, OT offering intermediate rest breaks and positioning suggestions throughout session to address pain/fatigue and maximize participation/safety in session.   Functional Transfers: Slide-board transfers throughout session with assistance provided for placement of board and thereafter CGA-close supervision.  Self Care Tasks: Pt completes toileting tasks with setup for pericare and Max A for hiking LB garments with lateral leans.   Therapeutic Activities: Pt tasked with retrieving colored dots placed underneath LB while seated on therapy mat to target lateral leans for carryover into ADL performance. Pt completes x2 reps with supervision for sitting balance.   Therapeutic Exercise: Pt instructed in series of exercises for general conditioning, details below: 1x10 torso twists  1x10 forward reach 2x10 modified sit ups 1x10 RLE LAQs 1x5 (5 sec hold) LLE leg extensions  Pt completes core strengthening exercises with 1.5# medicine ball, requiring multimodal cuing for correct form/posture.   Pt remained sitting in WC with 4Ps assessed and immediate needs  met. Pt continues to be appropriate for skilled OT intervention to promote further functional independence in ADLs/IADLs.   Therapy Documentation Precautions:  Precautions Precautions: Fall Recall of Precautions/Restrictions: Intact Other Brace: RLE ampushield Restrictions Weight Bearing Restrictions Per Provider Order: Yes RLE Weight Bearing Per Provider Order: Non weight bearing Other Position/Activity Restrictions: no pillow under R knee   Therapy/Group: Individual Therapy  Nereida Habermann, OTR/L, MSOT  11/26/2023, 6:30 AM

## 2023-11-26 NOTE — Progress Notes (Signed)
 PROGRESS NOTE   Subjective/Complaints:  No new complaints or concerns today.  Working with therapy in his room.  LBM yesterday.  LBM today  ROS: Patient denies fevers, rash, N/V/C shortness of breath or chest pain, headache, or mood change. + muscle spasms-improved + chronic R shoulder pain- continued + loose Bms-- improved  Objective:   DG Shoulder Right Result Date: 11/25/2023 CLINICAL DATA:  Right shoulder pain. EXAM: RIGHT SHOULDER - 2+ VIEW COMPARISON:  07/05/2022 FINDINGS: No acute fracture. There is superior subluxation of the humeral head abutting the undersurface of the acromion. Mild to moderate acromioclavicular degenerative spurring. Mild glenohumeral degenerative spurring. No fractures scratch at no erosive change or focal bone abnormality. No soft tissue calcifications. IMPRESSION: 1. Superior subluxation of the humeral head abutting the undersurface of the acromion, suggestive of rotator cuff arthropathy. 2. Mild to moderate acromioclavicular and mild glenohumeral osteoarthritis. Electronically Signed   By: Andrea Gasman M.D.   On: 11/25/2023 16:24   Recent Labs    11/24/23 0513  WBC 9.3  HGB 9.5*  HCT 29.8*  PLT 385    Recent Labs    11/24/23 0513  NA 141  K 3.3*  CL 105  CO2 25  GLUCOSE 84  BUN 27*  CREATININE 0.61  CALCIUM  8.8*    Intake/Output Summary (Last 24 hours) at 11/26/2023 0859 Last data filed at 11/26/2023 0700 Gross per 24 hour  Intake 980 ml  Output 450 ml  Net 530 ml        Physical Exam: Vital Signs Blood pressure 124/76, pulse 77, temperature 97.9 F (36.6 C), temperature source Oral, resp. rate 18, height 6' 2 (1.88 m), weight 85.9 kg, SpO2 97%.   Constitution: No acute distress,Sitting in WC, working with therapy in his room Resp: CTAB, nonlabored breathing, on RA Cardio: RRR Abdomen: Nondistended. Nontender.  + Bowel sounds Psych: Appropriate mood and affect.  Pleasant Skin: BKA with shrinker and limb protector in place       Left foot with dry, flaking skin throughout; soft, boggy heels without apparent breakdown, Blanchable areas of erythema on top of 1st and 5th toes--not examined 7/1   Neuro: AAOx4. No apparent cognitive deficits  Sensory exam: Reduced sensation to light touch in distal left lower extremity Motor exam: strength 5/5 throughout bilateral upper extremities, bilateral lower extremities, and with exception of right shoulder 4/5   Prior neuro assessment is c/w today's exam 11/27/2023.  MSK: Limited range of motion of right shoulder Right shoulder limited in flexion, abduction to less than 100 degrees.  No TTP. Pain with shoulder abduction, internal and external rotation.  Prior second toe amputation.  Assessment/Plan: 1. Functional deficits which require 3+ hours per day of interdisciplinary therapy in a comprehensive inpatient rehab setting. Physiatrist is providing close team supervision and 24 hour management of active medical problems listed below. Physiatrist and rehab team continue to assess barriers to discharge/monitor patient progress toward functional and medical goals  Care Tool:  Bathing    Body parts bathed by patient: Right arm, Left arm, Chest, Abdomen, Right upper leg, Left upper leg, Face, Front perineal area   Body parts bathed by helper: Buttocks, Right upper leg  Body parts n/a: Right lower leg   Bathing assist Assist Level: Moderate Assistance - Patient 50 - 74%     Upper Body Dressing/Undressing Upper body dressing   What is the patient wearing?: Pull over shirt    Upper body assist Assist Level: Minimal Assistance - Patient > 75%    Lower Body Dressing/Undressing Lower body dressing      What is the patient wearing?: Pants, Incontinence brief     Lower body assist Assist for lower body dressing: Total Assistance - Patient < 25%     Toileting Toileting    Toileting assist Assist for  toileting: Total Assistance - Patient < 25%     Transfers Chair/bed transfer  Transfers assist  Chair/bed transfer activity did not occur: Safety/medical concerns  Chair/bed transfer assist level: Supervision/Verbal cueing (slide board transfer)     Locomotion Ambulation   Ambulation assist   Ambulation activity did not occur: Safety/medical concerns          Walk 10 feet activity   Assist  Walk 10 feet activity did not occur: Safety/medical concerns        Walk 50 feet activity   Assist Walk 50 feet with 2 turns activity did not occur: Safety/medical concerns         Walk 150 feet activity   Assist Walk 150 feet activity did not occur: Safety/medical concerns         Walk 10 feet on uneven surface  activity   Assist Walk 10 feet on uneven surfaces activity did not occur: Safety/medical concerns         Wheelchair     Assist Is the patient using a wheelchair?: Yes Type of Wheelchair: Manual Wheelchair activity did not occur: Safety/medical concerns  Wheelchair assist level: Supervision/Verbal cueing Max wheelchair distance: 150+    Wheelchair 50 feet with 2 turns activity    Assist    Wheelchair 50 feet with 2 turns activity did not occur: Safety/medical concerns   Assist Level: Supervision/Verbal cueing   Wheelchair 150 feet activity     Assist  Wheelchair 150 feet activity did not occur: Safety/medical concerns   Assist Level: Supervision/Verbal cueing   Blood pressure 124/76, pulse 77, temperature 97.9 F (36.6 C), temperature source Oral, resp. rate 18, height 6' 2 (1.88 m), weight 85.9 kg, SpO2 97%.  Medical Problem List and Plan: 1. Functional deficits secondary to R BKA d/t osteomyelitis 11/14/23 by Dr. Harden, hx of charcot joint             -patient may not shower until wound vac removed             -ELOS/Goals: 16-18 days              -f/up with Dr. Harden 1wk after d/c, continue prevena plus wound vac              - Continue CIR  -Expected discharge 7/11  -Wound vac discontinued- monitor incision   2.  Antithrombotics: -DVT/anticoagulation:  Pharmaceutical: Eliquis  5mg  BID             -antiplatelet therapy: n/a 3. Pain Management/DDD/neuropathy: tylenol  and oxycodone  PRN, flexeril  PRN, voltaren  PRN, Lyrica  100mg  BID, Fioricet  PRN  -6/24 Advised trying PRN flexeril  for muscle spasms  -6/25 used flexeril  last night, pain is controlled, continue current regimen  -6/27 occasional use of flexeril , continue current. Pain controlled overall, could consider trial decrease lyrica  since pain doing well  6/28 not requiring recent flexeril , pain  overall controlled  6/29 Voltaren  for right shoulder pain scheduled  7/1 Xray R shoulder- OA and subluxation humeral head  Consider shoulder cortisone injection -discussed with patient, shoulder function is improving we will hold off on injection for now  4. Mood/Behavior/Sleep: No concerns             -antipsychotic agents: n/a 5. Neuropsych/cognition: This patient is capable of making decisions on his own behalf.  7/1 patient was seen by neuropsychology yesterday for coping and adjustment, appreciate assistance 6. Skin/Wound Care: routine skin checks, continue wound vac R BKA, wound care LUE wound -Add Eucerin cream to left foot daily 6/30: Plan to remove wound VAC today as above 7/1 Discuss wound vac removal with nursing Vac has been removed, monitor incision   7. Fluids/Electrolytes/Nutrition/hypokalemia/hyponatremia: routine I&O, routine labs, continue vitamins/supplements, continue Kdur 20mEq daily  -6/23 give additional 20 mEq of potassium today  6/25 K+ up to 3.7, continue supplementation and monitor  6/27 give additional 20meq today for K+ 3.4  6/30: K 3.3; potassium chloride  increased from 20 mill equivalents daily to 40 meq daily    7/3 potassium stable at 3.8, recheck Monday  8. PAF on Eliquis  and Diltiazem  180mg  daily  - Heart rate  controlled 9. HFpEF (EF 40-45% 02/2023): resume home Lasix  40mg  daily, daily weights-ordered Filed Weights   11/24/23 0452 11/25/23 0443 11/26/23 0500  Weight: 94.1 kg 88.2 kg 85.9 kg  -6/25 wt a little lower, monitor tend, decrease lasix  to 20mg  daily, Cr was a little higher at 0.71/BUN 16, recheck friday -6/29 No signs of overload, continue to monitor weight and recheck labs tomorrow 6/30: unreliable; no external signs of volume overload.  Continue current regimen. 7/3 no signs of fluid overload, weights do not appear consistent day-to-day, likely not accurate  10. HTN: Continue Diltiazem  as above, home Imdur  on hold, resume as appropriate    11/26/2023    6:18 AM 11/26/2023    5:00 AM 11/25/2023    8:45 PM  Vitals with BMI  Weight  189 lbs 6 oz   BMI  24.3   Systolic 124  104  Diastolic 76  63  Pulse 77  93  7/1-3 BP controlled continue to monitor  11. HLD: continue Lipitor 20mg  daily 12. COPD/agent orange exposure: continue chronic Prednisone  20mg  daily, continue Brovana /Pulmicort  nebs, Mucinex  PRN, Duonebs PRN 13. BPH: continue Flomax  0.4mg  nightly 14. GERD: continue Protonix  40mg  BID, Maalox PRN 15. ?Gout/elevated uric acid: home med Allopurinol  100mg  qAM on hold, restart as appropriate.  16. Anemia, acute on chronic: Hgb 9-10 range during acute stay, monitor labs  6/30: HgB 9.5; stable  7/3 hemoglobin stable at 9.6  17. Vitamin B12 deficiency: 1000mcg SQ weekly ordered during admission, f/up on transition to oral per primary team 18. Constipation: LBM day of admission, continue Colace 100mg  BID and Miralax  daily  - 6/25 LBM today, continue to monitor   -6/26 discontinue Colace and change MiraLAX  to as needed  -7/2 reports loose Bms, consider imodium PRN if continues   7/3 has not had bowel movement since yesterday morning, patient thinks loose bowel movements prior days may be something he ate, continue to monitor    LOS: 10 days A FACE TO FACE EVALUATION WAS  PERFORMED  Murray Collier 11/26/2023, 8:59 AM

## 2023-11-26 NOTE — Patient Care Conference (Signed)
 Inpatient RehabilitationTeam Conference and Plan of Care Update Date: 11/26/2023   Time: 12:14 PM    Patient Name: Levi [name]      Medical Record Number: [id number]  Date of Birth: [birth year] Sex: Male         Room/Bed: 4W03C/4W03C-01 Payor Info: Payor: MEDICARE / Plan: MEDICARE PART A AND B / Product Type: *No Product type* /    Admit Date/Time:  11/16/2023  1:15 PM  Primary Diagnosis:  S/P BKA (below knee amputation) unilateral, right [facility])  Hospital Problems: Principal Problem:   S/P BKA (below knee amputation) unilateral, right (HCC) Active Problems:   Coping style affecting medical condition    Expected Discharge Date: Expected Discharge Date: 12/05/23  Team Members Present: Physician leading conference: Dr. Murray Collier Social Worker Present: Rhoda Clement, LCSW Nurse Present: Barnie Ronde, RN PT Present: Doreene Orris, PT OT Present: Other (comment) ANCEL Seip, OT) PPS Coordinator present : Eleanor Colon, SLP     Current Status/Progress Goal Weekly Team Focus  Bowel/Bladder     Continent of bladder Incontinent of bowel     Continent of bowel and bladder    Prompted toileting  Swallow/Nutrition/ Hydration               ADL's   CG to min slide board transfers, R RTC issues continuing, UB set up, LB min to mod, footwear max, toileting max   min A. HHOT   UB strength, sit to stands, slide board transfers from surfaces, family training,    Mobility   bed mobility: supervision with use of bed features, WC mobility: 150 feet with superivison with B UE, and L LE slide board transfer supervision level and downhill, min A uphill (working on pt ability to place slide board), sit to stand with +2 A in // bars, unable from W/C 2/2 strength/balance deficits and significant posterior bais   supervision/CGA WC level, mod A sit to stand, discontinued ambulation/stair goals  D/C 7/11, HHPT, DME: pt has RW, pt needs WC, hospital bed, slide board, barriers to D/C: bowel  and bladder incontinence, overall debility, B UE mobility, healing of incision.    Communication                Safety/Cognition/ Behavioral Observations               Pain     N/A            Skin      N/A           Discharge Planning:  Wife is here and participates in pt's care. Aware of his care needs and need to regain strength. Will work on DC needs.   Team Discussion: Patient post right BKA; wound vac removed.  Patient on target to meet rehab goals: yes, currently needs CGA - min assist for slide-board transfers; supervision going downhill and min assist uphill.  Progress is limited by poor core stability and chronic RTC issues and, posterior bias, pushing with residual limb.  Needs +2 assist for sit - stand and mod assist for toileting.  Needs max assist to don footwear using a donning tool.    *See Care Plan and progress notes for long and short-term goals.   Revisions to Treatment Plan:  N/a   Teaching Needs: Safety, medications, transfers, toileting, residual limb care, etc.   Current Barriers to Discharge: Decreased caregiver support, Home enviroment access/layout, and Weight bearing restrictions  Possible Resolutions to Barriers: Family education HH follow  up services DME: hospital bed, and wheelchair     Medical Summary Current Status: R BKA d/t osteomyelitis 11/14/23, hypokalemia, HF, HTN, BPH, Loose BMs, shoulder pain  Barriers to Discharge: Electrolyte abnormality;Incontinence;Self-care education;Uncontrolled Pain  Barriers to Discharge Comments: R BKA d/t osteomyelitis 11/14/23, hypokalemia, HF, HTN, BPH, Loose BMs, shoulder pain Possible Resolutions to [employer] Focus: monitor bowel function, recheck labs tomorrow, shoulder xray completed- consider injection, monitor BP   Continued Need for Acute Rehabilitation Level of Care: The patient requires daily medical management by a physician with specialized training in physical medicine and  rehabilitation for the following reasons: Direction of a multidisciplinary physical rehabilitation program to maximize functional independence : Yes Medical management of patient stability for increased activity during participation in an intensive rehabilitation regime.: Yes Analysis of laboratory values and/or radiology reports with any subsequent need for medication adjustment and/or medical intervention. : Yes   I attest that I was present, lead the team conference, and concur with the assessment and plan of the team.   Fredericka Sober B 11/26/2023, 2:31 PM

## 2023-11-26 NOTE — Progress Notes (Signed)
 Occupational Therapy Session Note  Patient Details  Name: Levi [name] MRN: [id number] Date of Birth: 1945-09-21  Session 1: Today's Date: 11/26/2023 OT Individual Time: 0800-0900 OT Individual Time Calculation (min): 60 min   Session 2: Today's Date: 11/26/2023 OT Individual Time: 8671-8570 OT Individual Time Calculation (min): 61 min   Short Term Goals: Week 1:  OT Short Term Goal 1 (Week 1): Pt will transfer to the toilet/ BSC with mod A +1 OT Short Term Goal 1 - Progress (Week 1): Met OT Short Term Goal 2 (Week 1): Pt will be able to perform sit to stands in prep for ADL tasks with mod A +1 consistenly OT Short Term Goal 2 - Progress (Week 1): Progressing toward goal OT Short Term Goal 3 (Week 1): Pt will perform LB dressing with AEprn with mod A OT Short Term Goal 3 - Progress (Week 1): Met OT Short Term Goal 4 (Week 1): Pt will perform bed mobility in prep for ADL tasks without bed features with supervision OT Short Term Goal 4 - Progress (Week 1): Progressing toward goal Week 2:  OT Short Term Goal 1 (Week 2): (P) patient will complete slide board transfers to wc, bed, and mat table with min A including placement on slide board OT Short Term Goal 2 (Week 2): (P) patient will complete toileting on bsc over toilet with modA  Session 1: Skilled Therapeutic Interventions/Progress Updates:    Patient agreeable to participate in OT session. Reports 7/10 pain level.   Patient participated in skilled OT session focusing on ADL participation, bathing, transfers, dressing. Patient found in bed requesting bed pan due to need for bowel movement. Patient had incontinence episode when removing brief for bowel. Paient able to complete peri hygiene in supine with CGA assistance and mod verbal cues for thoroughness. Patient CGA/ touch assistance for LB dressing on EOB. Patient completed UB dressing set up. Patient completed UB bathing set up, LB bathing mod A. Patient min A to place limb protector  due to UE ROM limitations. Mod A to slide board due to fatigue. OT faciliatated increased core strength through postural alignment during EOB activities.   Session 2: Skilled Therapeutic Interventions/Progress Updates:    Patient agreeable to participate in OT session. Reports 7/10 pain with phantom limb pain and shoulder pain reported.   Patient participated in skilled OT session focusing on standing tolerance, UE mobility, functional mobility with wc. Patient able to complete slide board transfer without physical assistance with min verbal cues for safety. Patient able to complete sit to stand in parallel bars min to max with increasing assist needed with fatigue. Patient able to complete wc mobility with increased time. Patient completed UBE to increase UE ROM and increase activity tolerance.   Therapy Documentation Precautions:  Precautions Precautions: Fall Recall of Precautions/Restrictions: Intact Other Brace: RLE ampushield Restrictions Weight Bearing Restrictions Per Provider Order: Yes RLE Weight Bearing Per Provider Order: Non weight bearing Other Position/Activity Restrictions: no pillow under R knee  Therapy/Group: Individual Therapy  D'mariea L Alannie Amodio OTR/L  11/26/2023, 7:47 AM

## 2023-11-26 NOTE — Progress Notes (Signed)
 Physical Therapy Session Note  Patient Details  Name: Levi [name] MRN: [id number] Date of Birth: 12-12-1945  Today's Date: 11/26/2023 PT Individual Time: 1016-1058 PT Individual Time Calculation (min): 42 min   Short Term Goals: Week 2:  PT Short Term Goal 1 (Week 2): pt caregiver will complete and return home measurement sheet PT Short Term Goal 2 (Week 2): pt will perform sit to stand with LRAD and mod A  Skilled Therapeutic Interventions/Progress Updates:     Pt supine in bed upon arrival. Pt agreeable to therapy. Pt denies any pain.   Pt wound vac removed evening of 7/1. Education provided on importance of performing skin inspection daily on B LE, to assess for signs and symptoms of pressure injury/infection. Education provided for recognizing signs/symptoms of pressure injury/infection. Education provided on importance of inspecting all aspects of B LE including between the toes. Emphasized importance Pt verbalized understanding and agreeable. Pt performed skin inspection, verbal cues provided for technique.   Pt donned and doffed shrinker with min A, pt donned limb protector with min A, pt demonstrates improved technique.   Notified scott, representative from [employer], of need for 2XL shrinkers.   Pt seated in [facility] with all needs within bed alarm on and needs within reach.       Therapy Documentation Precautions:  Precautions Precautions: Fall Recall of Precautions/Restrictions: Intact Other Brace: RLE ampushield Restrictions Weight Bearing Restrictions Per Provider Order: Yes RLE Weight Bearing Per Provider Order: Non weight bearing Other Position/Activity Restrictions: no pillow under R knee  Therapy/Group: Individual Therapy  [facility] [place], , DPT  11/26/2023, 10:22 AM

## 2023-11-27 LAB — BASIC METABOLIC PANEL WITH GFR
Anion gap: 10 (ref 5–15)
BUN: 25 mg/dL — ABNORMAL HIGH (ref 8–23)
CO2: 27 mmol/L (ref 22–32)
Calcium: 8.9 mg/dL (ref 8.9–10.3)
Chloride: 103 mmol/L (ref 98–111)
Creatinine, Ser: 0.8 mg/dL (ref 0.61–1.24)
GFR, Estimated: >60 mL/min (ref >60)
Glucose, Bld: 89 mg/dL (ref 70–99)
Potassium: 3.8 mmol/L (ref 3.5–5.1)
Sodium: 140 mmol/L (ref 135–145)

## 2023-11-27 LAB — CBC
HCT: 31.3 % — ABNORMAL LOW (ref 39.0–52.0)
Hemoglobin: 9.6 g/dL — ABNORMAL LOW (ref 13.0–17.0)
MCH: 29.2 pg (ref 26.0–34.0)
MCHC: 30.7 g/dL (ref 30.0–36.0)
MCV: 95.1 fL (ref 80.0–100.0)
Platelets: 336 10*3/uL (ref 150–400)
RBC: 3.29 MIL/uL — ABNORMAL LOW (ref 4.22–5.81)
RDW: 15.9 % — ABNORMAL HIGH (ref 11.5–15.5)
WBC: 10.2 10*3/uL (ref 4.0–10.5)
nRBC: 0 % (ref 0.0–0.2)

## 2023-11-27 NOTE — Progress Notes (Signed)
 Occupational Therapy Session Note  Patient Details  Name: Levi [name] MRN: [id number] Date of Birth: 1945-09-10  Session 1: Today's Date: 11/27/2023 OT Individual Time: 9199-9084 OT Individual Time Calculation (min): 75 min   Session 2: Today's Date: 11/27/2023 OT Individual Time: 8648-8494 OT Individual Time Calculation (min): 74 min   Short Term Goals: Week 1:  OT Short Term Goal 1 (Week 1): Pt will transfer to the toilet/ BSC with mod A +1 OT Short Term Goal 1 - Progress (Week 1): Met OT Short Term Goal 2 (Week 1): Pt will be able to perform sit to stands in prep for ADL tasks with mod A +1 consistenly OT Short Term Goal 2 - Progress (Week 1): Progressing toward goal OT Short Term Goal 3 (Week 1): Pt will perform LB dressing with AEprn with mod A OT Short Term Goal 3 - Progress (Week 1): Met OT Short Term Goal 4 (Week 1): Pt will perform bed mobility in prep for ADL tasks without bed features with supervision OT Short Term Goal 4 - Progress (Week 1): Progressing toward goal Week 2:  OT Short Term Goal 1 (Week 2): (P) patient will complete slide board transfers to wc, bed, and mat table with min A including placement on slide board OT Short Term Goal 2 (Week 2): (P) patient will complete toileting on bsc over toilet with modA  Session 1: Skilled Therapeutic Interventions/Progress Updates:  Patient agreeable to participate in OT session. Reports 5/10 pain level.   Patient participated in skilled OT session focusing on Bathing, dressing, transfers, core strength, and mobility. Patient able to complete all slide board transfers CGA to min A with verbal cues. Patient SUP A for bathing all body parts with lateral lean for buttocks. Patient set up for UB dressing, minA for LB dressing due to limb protector. Patient able to complete footwear donning set up. Therapist facilitated increased core strength and posture during transfers and scooting laterally on bed in order to improve safety with  mobility and strengthening.    Session 2: Skilled Therapeutic Interventions/Progress Updates:  Patient agreeable to participate in OT session. Reports 7/10 pain level.   Patient participated in skilled OT session focusing on standing tolerance, UE strengthening via positioning, lateral scooting/ mobility, transfers, and wc mobility. Patient completed wc mobility to gym. Patient completed sit to stand x6 with min to mod A to stand in parallel bars from wc. Patient completed mini squats during standing to increase LE strength related to transfers and ADL participation. Patient completed slide board transfer CG to minA to mat table. Able to complete lateral scooting with clearance from mat to increase transfers and mobility. Patient completed mat table push ups with buttocks clearance from mat with good skill. Patient returned to room with all needs met, alarm in reach.    Therapy Documentation Precautions:  Precautions Precautions: Fall Recall of Precautions/Restrictions: Intact Other Brace: RLE ampushield Restrictions Weight Bearing Restrictions Per Provider Order: No RLE Weight Bearing Per Provider Order: Non weight bearing Other Position/Activity Restrictions: no pillow under R knee   Therapy/Group: Individual Therapy  D'mariea L Najae Filsaime OTR/L  11/27/2023, 7:49 AM

## 2023-11-27 NOTE — Progress Notes (Signed)
 Physical Therapy Session Note  Patient Details  Name: Levi [name] MRN: [id number] Date of Birth: Sep 11, 1945  Today's Date: 11/27/2023 PT Individual Time: 1003-1102 PT Individual Time Calculation (min): 59 min   Short Term Goals: Week 2:  PT Short Term Goal 1 (Week 2): pt caregiver will complete and return home measurement sheet PT Short Term Goal 2 (Week 2): pt will perform sit to stand with LRAD and mod A  Skilled Therapeutic Interventions/Progress Updates:      Today's Interventions  Pt seated in WC upon arrival. Pt agreeable to therapy. Pt reports 7/10 R UE pain, requesting medication, notified nursing. Nurse present to administer medication. Pt provided rest breaks and repositioning as needed.   Pt performed slide board transfer with supervision, pt required assist for proper placement of slide board. Verbal cues provided for L LE foot positioning for improved body mechanics.   Pt performed the following therex for B LE strengthening/postural control/independence with mobility   1x10 seated marching B with therapist encouraging upright posture  1x10 R LE LAQ with therapist encouraging upright posture   L LE ankle pumps x10  L LE ankle eversion x10  1x10 SLR B  1x10 glute bridge holding 5 seconds with R LE on ball  1x10 glute sets holding 5 seconds 1x10 each sidelying B LE hip extension, BLE hip abduction, verbal and tactile cues provided for technique and compensation reduction 1x10 prone hip extension, verbal and tactile cues provided for technique and compensation reduction--pt demonstrates increased difficulty performing on L LE.   1x10 multidirectional reaching with L UE to ball in therapaist hand to facilitate improved anterior weight shift, trunk flexion, lateral trunk lean, and correction of posterior bias.   Pt performed sit to stand x2 from elevated mat table with RW and +1 min A. Pt performed 2x5 mini squats with RW and min A, verbal cues provided for technique.   Pt  seated in [facility] with all needs within reach and chair alarm on.               Therapy Documentation Precautions:  Precautions Precautions: Fall Recall of Precautions/Restrictions: Intact Other Brace: RLE ampushield Restrictions Weight Bearing Restrictions Per Provider Order: No RLE Weight Bearing Per Provider Order: Non weight bearing Other Position/Activity Restrictions: no pillow under R knee  Therapy/Group: Individual Therapy  [facility], [place], DPT  11/27/2023, 10:04 AM

## 2023-11-27 NOTE — Progress Notes (Signed)
 PROGRESS NOTE   Subjective/Complaints:  No new complaints or concerns today.  Working with therapy in his room.  LBM yesterday.  LBM today  ROS: Patient denies fevers, rash, N/V/C shortness of breath or chest pain, headache, or mood change. + muscle spasms-improved + chronic R shoulder pain- continued + loose Bms-- improved  Objective:   DG Shoulder Right Result Date: 11/25/2023 CLINICAL DATA:  Right shoulder pain. EXAM: RIGHT SHOULDER - 2+ VIEW COMPARISON:  07/05/2022 FINDINGS: No acute fracture. There is superior subluxation of the humeral head abutting the undersurface of the acromion. Mild to moderate acromioclavicular degenerative spurring. Mild glenohumeral degenerative spurring. No fractures scratch at no erosive change or focal bone abnormality. No soft tissue calcifications. IMPRESSION: 1. Superior subluxation of the humeral head abutting the undersurface of the acromion, suggestive of rotator cuff arthropathy. 2. Mild to moderate acromioclavicular and mild glenohumeral osteoarthritis. Electronically Signed   By: Andrea Gasman M.D.   On: 11/25/2023 16:24   Recent Labs    11/27/23 0502  WBC 10.2  HGB 9.6*  HCT 31.3*  PLT 336    Recent Labs    11/27/23 0502  NA 140  K 3.8  CL 103  CO2 27  GLUCOSE 89  BUN 25*  CREATININE 0.80  CALCIUM  8.9    Intake/Output Summary (Last 24 hours) at 11/27/2023 1238 Last data filed at 11/27/2023 0715 Gross per 24 hour  Intake 600 ml  Output 1175 ml  Net -575 ml        Physical Exam: Vital Signs Blood pressure 126/82, pulse 77, temperature 98 F (36.7 C), resp. rate 16, height 6' 2 (1.88 m), weight 86.5 kg, SpO2 100%.   Constitution: No acute distress,Sitting in WC, working with therapy in his room Resp: CTAB, nonlabored breathing, on RA Cardio: RRR Abdomen: Nondistended. Nontender.  + Bowel sounds Psych: Appropriate mood and affect. Pleasant Skin: BKA with  shrinker and limb protector in place       Left foot with dry, flaking skin throughout; soft, boggy heels without apparent breakdown, Blanchable areas of erythema on top of 1st and 5th toes--not examined 7/1   Neuro: AAOx4. No apparent cognitive deficits  Sensory exam: Reduced sensation to light touch in distal left lower extremity Motor exam: strength 5/5 throughout bilateral upper extremities, bilateral lower extremities, and with exception of right shoulder 4/5   Prior neuro assessment is c/w today's exam 11/27/2023.  MSK: Limited range of motion of right shoulder Right shoulder limited in flexion, abduction to less than 100 degrees.  No TTP. Pain with shoulder abduction, internal and external rotation.  Prior second toe amputation.  Assessment/Plan: 1. Functional deficits which require 3+ hours per day of interdisciplinary therapy in a comprehensive inpatient rehab setting. Physiatrist is providing close team supervision and 24 hour management of active medical problems listed below. Physiatrist and rehab team continue to assess barriers to discharge/monitor patient progress toward functional and medical goals  Care Tool:  Bathing    Body parts bathed by patient: Right arm, Left arm, Chest, Abdomen, Right upper leg, Left upper leg, Face, Front perineal area   Body parts bathed by helper: Buttocks, Right upper leg Body parts n/a:  Right lower leg   Bathing assist Assist Level: Moderate Assistance - Patient 50 - 74%     Upper Body Dressing/Undressing Upper body dressing   What is the patient wearing?: Pull over shirt    Upper body assist Assist Level: Minimal Assistance - Patient > 75%    Lower Body Dressing/Undressing Lower body dressing      What is the patient wearing?: Pants, Incontinence brief     Lower body assist Assist for lower body dressing: Total Assistance - Patient < 25%     Toileting Toileting    Toileting assist Assist for toileting: Total  Assistance - Patient < 25%     Transfers Chair/bed transfer  Transfers assist  Chair/bed transfer activity did not occur: Safety/medical concerns  Chair/bed transfer assist level: Supervision/Verbal cueing (slide board transfer)     Locomotion Ambulation   Ambulation assist   Ambulation activity did not occur: Safety/medical concerns          Walk 10 feet activity   Assist  Walk 10 feet activity did not occur: Safety/medical concerns        Walk 50 feet activity   Assist Walk 50 feet with 2 turns activity did not occur: Safety/medical concerns         Walk 150 feet activity   Assist Walk 150 feet activity did not occur: Safety/medical concerns         Walk 10 feet on uneven surface  activity   Assist Walk 10 feet on uneven surfaces activity did not occur: Safety/medical concerns         Wheelchair     Assist Is the patient using a wheelchair?: Yes Type of Wheelchair: Manual Wheelchair activity did not occur: Safety/medical concerns  Wheelchair assist level: Supervision/Verbal cueing Max wheelchair distance: 150+    Wheelchair 50 feet with 2 turns activity    Assist    Wheelchair 50 feet with 2 turns activity did not occur: Safety/medical concerns   Assist Level: Supervision/Verbal cueing   Wheelchair 150 feet activity     Assist  Wheelchair 150 feet activity did not occur: Safety/medical concerns   Assist Level: Supervision/Verbal cueing   Blood pressure 126/82, pulse 77, temperature 98 F (36.7 C), resp. rate 16, height 6' 2 (1.88 m), weight 86.5 kg, SpO2 100%.  Medical Problem List and Plan: 1. Functional deficits secondary to R BKA d/t osteomyelitis 11/14/23 by Dr. Harden, hx of charcot joint             -patient may not shower until wound vac removed             -ELOS/Goals: 16-18 days              -f/up with Dr. Harden 1wk after d/c, continue prevena plus wound vac             - Continue CIR  -Expected discharge  7/11  -Wound vac discontinued- monitor incision   2.  Antithrombotics: -DVT/anticoagulation:  Pharmaceutical: Eliquis  5mg  BID             -antiplatelet therapy: n/a 3. Pain Management/DDD/neuropathy: tylenol  and oxycodone  PRN, flexeril  PRN, voltaren  PRN, Lyrica  100mg  BID, Fioricet  PRN  -6/24 Advised trying PRN flexeril  for muscle spasms  -6/25 used flexeril  last night, pain is controlled, continue current regimen  -6/27 occasional use of flexeril , continue current. Pain controlled overall, could consider trial decrease lyrica  since pain doing well  6/28 not requiring recent flexeril , pain overall controlled  6/29 Voltaren  for  right shoulder pain scheduled  7/1 Xray R shoulder- OA and subluxation humeral head  Consider shoulder cortisone injection -discussed with patient, shoulder function is improving we will hold off on injection for now  4. Mood/Behavior/Sleep: No concerns             -antipsychotic agents: n/a 5. Neuropsych/cognition: This patient is capable of making decisions on his own behalf.  7/1 patient was seen by neuropsychology yesterday for coping and adjustment, appreciate assistance 6. Skin/Wound Care: routine skin checks, continue wound vac R BKA, wound care LUE wound -Add Eucerin cream to left foot daily 6/30: Plan to remove wound VAC today as above 7/1 Discuss wound vac removal with nursing Vac has been removed, monitor incision   7. Fluids/Electrolytes/Nutrition/hypokalemia/hyponatremia: routine I&O, routine labs, continue vitamins/supplements, continue Kdur 20mEq daily  -6/23 give additional 20 mEq of potassium today  6/25 K+ up to 3.7, continue supplementation and monitor  6/27 give additional 20meq today for K+ 3.4  6/30: K 3.3; potassium chloride  increased from 20 mill equivalents daily to 40 meq daily    7/3 potassium stable at 3.8, recheck Monday  8. PAF on Eliquis  and Diltiazem  180mg  daily  - Heart rate controlled 9. HFpEF (EF 40-45% 02/2023): resume home  Lasix  40mg  daily, daily weights-ordered Filed Weights   11/25/23 0443 11/26/23 0500 11/27/23 0500  Weight: 88.2 kg 85.9 kg 86.5 kg  -6/25 wt a little lower, monitor tend, decrease lasix  to 20mg  daily, Cr was a little higher at 0.71/BUN 16, recheck friday -6/29 No signs of overload, continue to monitor weight and recheck labs tomorrow 6/30: unreliable; no external signs of volume overload.  Continue current regimen. 7/3 no signs of fluid overload, weights do not appear consistent day-to-day, likely not accurate  10. HTN: Continue Diltiazem  as above, home Imdur  on hold, resume as appropriate    11/27/2023    5:12 AM 11/27/2023    5:00 AM 11/26/2023    8:08 PM  Vitals with BMI  Weight  190 lbs 11 oz   BMI  24.47   Systolic 126  105  Diastolic 82  94  Pulse 77  78  7/1-3 BP controlled continue to monitor  11. HLD: continue Lipitor 20mg  daily 12. COPD/agent orange exposure: continue chronic Prednisone  20mg  daily, continue Brovana /Pulmicort  nebs, Mucinex  PRN, Duonebs PRN 13. BPH: continue Flomax  0.4mg  nightly 14. GERD: continue Protonix  40mg  BID, Maalox PRN 15. ?Gout/elevated uric acid: home med Allopurinol  100mg  qAM on hold, restart as appropriate.  16. Anemia, acute on chronic: Hgb 9-10 range during acute stay, monitor labs  6/30: HgB 9.5; stable  7/3 hemoglobin stable at 9.6  17. Vitamin B12 deficiency: 1000mcg SQ weekly ordered during admission, f/up on transition to oral per primary team 18. Constipation: LBM day of admission, continue Colace 100mg  BID and Miralax  daily  - 6/25 LBM today, continue to monitor   -6/26 discontinue Colace and change MiraLAX  to as needed  -7/2 reports loose Bms, consider imodium PRN if continues   7/3 has not had bowel movement since yesterday morning, patient thinks loose bowel movements prior days may be something he ate, continue to monitor    LOS: 11 days A FACE TO FACE EVALUATION WAS PERFORMED  Murray Collier 11/27/2023, 12:38 PM

## 2023-11-28 DIAGNOSIS — G548 Other nerve root and plexus disorders: Secondary | ICD-10-CM

## 2023-11-28 DIAGNOSIS — R52 Pain, unspecified: Secondary | ICD-10-CM

## 2023-11-28 NOTE — Progress Notes (Addendum)
 PROGRESS NOTE   Subjective/Complaints:  No new complaints or concerns this morning.  Reports occasional phantom sensation but not very painful.  Reports prior diarrhea has improved.  Reports appetite is good.  LBM today  ROS: Patient denies Chills, N/V/D/C shortness of breath or chest pain, headache, or mood change. + muscle spasms-improved + chronic R shoulder pain- continued  Objective:   No results found.  Recent Labs    11/27/23 0502  WBC 10.2  HGB 9.6*  HCT 31.3*  PLT 336    Recent Labs    11/27/23 0502  NA 140  K 3.8  CL 103  CO2 27  GLUCOSE 89  BUN 25*  CREATININE 0.80  CALCIUM  8.9    Intake/Output Summary (Last 24 hours) at 11/28/2023 1124 Last data filed at 11/28/2023 0700 Gross per 24 hour  Intake 710 ml  Output 1425 ml  Net -715 ml        Physical Exam: Vital Signs Blood pressure 114/74, pulse 93, temperature (!) 97.5 F (36.4 C), temperature source Oral, resp. rate 17, height 6' 2 (1.88 m), weight 85.4 kg, SpO2 95%.   Constitution: No acute distress,Sitting in WC in his room Resp: CTAB, nonlabored breathing, on RA Cardio: RRR Abdomen: Nondistended. Nontender.  + Bowel sounds Psych: Appropriate mood and affect. Pleasant Skin: BKA with shrinker and limb protector in place   Left foot with dry, flaking skin throughout; soft, boggy heels without apparent breakdown, Blanchable areas of erythema on top of 1st and 5th toes--not examined 7/1   Neuro: AAOx4. No apparent cognitive deficits  Sensory exam: Reduced sensation to light touch in distal left lower extremity Motor exam: strength 5/5 throughout bilateral upper extremities, bilateral lower extremities, and with exception of right shoulder 4/5   Prior neuro assessment is c/w today's exam 11/28/2023.  MSK: Limited range of motion of right shoulder Right shoulder limited in flexion, abduction to less than 100 degrees.  No TTP. Pain with  shoulder abduction, internal and external rotation.  Prior second toe amputation.     Assessment/Plan: 1. Functional deficits which require 3+ hours per day of interdisciplinary therapy in a comprehensive inpatient rehab setting. Physiatrist is providing close team supervision and 24 hour management of active medical problems listed below. Physiatrist and rehab team continue to assess barriers to discharge/monitor patient progress toward functional and medical goals  Care Tool:  Bathing    Body parts bathed by patient: Right arm, Left arm, Chest, Abdomen, Right upper leg, Left upper leg, Face, Front perineal area   Body parts bathed by helper: Buttocks, Right upper leg Body parts n/a: Right lower leg   Bathing assist Assist Level: Moderate Assistance - Patient 50 - 74%     Upper Body Dressing/Undressing Upper body dressing   What is the patient wearing?: Pull over shirt    Upper body assist Assist Level: Minimal Assistance - Patient > 75%    Lower Body Dressing/Undressing Lower body dressing      What is the patient wearing?: Pants, Incontinence brief     Lower body assist Assist for lower body dressing: Total Assistance - Patient < 25%     Toileting Toileting  Toileting assist Assist for toileting: Total Assistance - Patient < 25%     Transfers Chair/bed transfer  Transfers assist  Chair/bed transfer activity did not occur: Safety/medical concerns  Chair/bed transfer assist level: Supervision/Verbal cueing (slide board transfer)     Locomotion Ambulation   Ambulation assist   Ambulation activity did not occur: Safety/medical concerns          Walk 10 feet activity   Assist  Walk 10 feet activity did not occur: Safety/medical concerns        Walk 50 feet activity   Assist Walk 50 feet with 2 turns activity did not occur: Safety/medical concerns         Walk 150 feet activity   Assist Walk 150 feet activity did not occur:  Safety/medical concerns         Walk 10 feet on uneven surface  activity   Assist Walk 10 feet on uneven surfaces activity did not occur: Safety/medical concerns         Wheelchair     Assist Is the patient using a wheelchair?: Yes Type of Wheelchair: Manual Wheelchair activity did not occur: Safety/medical concerns  Wheelchair assist level: Supervision/Verbal cueing Max wheelchair distance: 150+    Wheelchair 50 feet with 2 turns activity    Assist    Wheelchair 50 feet with 2 turns activity did not occur: Safety/medical concerns   Assist Level: Supervision/Verbal cueing   Wheelchair 150 feet activity     Assist  Wheelchair 150 feet activity did not occur: Safety/medical concerns   Assist Level: Supervision/Verbal cueing   Blood pressure 114/74, pulse 93, temperature (!) 97.5 F (36.4 C), temperature source Oral, resp. rate 17, height 6' 2 (1.88 m), weight 85.4 kg, SpO2 95%.  Medical Problem List and Plan: 1. Functional deficits secondary to R BKA d/t osteomyelitis 11/14/23 by Dr. Harden, hx of charcot joint             -patient may not shower until wound vac removed             -ELOS/Goals: 16-18 days              -f/up with Dr. Harden 1wk after d/c, continue prevena plus wound vac             - Continue CIR  -Expected discharge 7/11  -Wound vac discontinued- monitor incision   2.  Antithrombotics: -DVT/anticoagulation:  Pharmaceutical: Eliquis  5mg  BID             -antiplatelet therapy: n/a 3. Pain Management/DDD/neuropathy: tylenol  and oxycodone  PRN, flexeril  PRN, voltaren  PRN, Lyrica  100mg  BID, Fioricet  PRN  -6/24 Advised trying PRN flexeril  for muscle spasms  -6/25 used flexeril  last night, pain is controlled, continue current regimen  -6/27 occasional use of flexeril , continue current. Pain controlled overall, could consider trial decrease lyrica  since pain doing well  6/28 not requiring recent flexeril , pain overall controlled  6/29 Voltaren   for right shoulder pain scheduled  7/1 Xray R shoulder- OA and subluxation humeral head  Consider shoulder cortisone injection -discussed with patient, shoulder function is improving we will hold off on injection for now  7/4 phantom sensation present but not much phantom pain, patient reports he did mirror therapy.  Continue to monitor for now  4. Mood/Behavior/Sleep: No concerns             -antipsychotic agents: n/a 5. Neuropsych/cognition: This patient is capable of making decisions on his own behalf.  7/1 patient was seen by  neuropsychology yesterday for coping and adjustment, appreciate assistance 6. Skin/Wound Care: routine skin checks, continue wound vac R BKA, wound care LUE wound -Add Eucerin cream to left foot daily 6/30: Plan to remove wound VAC today as above 7/1 Discuss wound vac removal with nursing Vac has been removed, monitor incision   7. Fluids/Electrolytes/Nutrition/hypokalemia/hyponatremia: routine I&O, routine labs, continue vitamins/supplements, continue Kdur 20mEq daily  -6/23 give additional 20 mEq of potassium today  6/25 K+ up to 3.7, continue supplementation and monitor  6/27 give additional 20meq today for K+ 3.4  6/30: K 3.3; potassium chloride  increased from 20 mill equivalents daily to 40 meq daily    7/3 potassium stable at 3.8, recheck Monday  8. PAF on Eliquis  and Diltiazem  180mg  daily  - Heart rate controlled 9. HFpEF (EF 40-45% 02/2023): resume home Lasix  40mg  daily, daily weights-ordered Filed Weights   11/26/23 0500 11/27/23 0500 11/28/23 0500  Weight: 85.9 kg 86.5 kg 85.4 kg  -6/25 wt a little lower, monitor tend, decrease lasix  to 20mg  daily, Cr was a little higher at 0.71/BUN 16, recheck friday -6/29 No signs of overload, continue to monitor weight and recheck labs tomorrow 6/30: unreliable; no external signs of volume overload.  Continue current regimen. 7/4 weights appear stable past few days, no signs of overload  10. HTN: Continue  Diltiazem  as above, home Imdur  on hold, resume as appropriate    11/28/2023    9:12 AM 11/28/2023    5:00 AM 11/28/2023    4:56 AM  Vitals with BMI  Weight  188 lbs 4 oz   BMI  24.16   Systolic 114  144  Diastolic 74  72  Pulse 93  85  7/1-4 BP controlled continue to monitor  11. HLD: continue Lipitor 20mg  daily 12. COPD/agent orange exposure: continue chronic Prednisone  20mg  daily, continue Brovana /Pulmicort  nebs, Mucinex  PRN, Duonebs PRN 13. BPH: continue Flomax  0.4mg  nightly 14. GERD: continue Protonix  40mg  BID, Maalox PRN 15. ?Gout/elevated uric acid: home med Allopurinol  100mg  qAM on hold, restart as appropriate.  16. Anemia, acute on chronic: Hgb 9-10 range during acute stay, monitor labs  6/30: HgB 9.5; stable  7/3 hemoglobin stable at 9.6  Recheck monday  17. Vitamin B12 deficiency: 1000mcg SQ weekly ordered during admission, f/up on transition to oral per primary team 18. Constipation: LBM day of admission, continue Colace 100mg  BID and Miralax  daily  - 6/25 LBM today, continue to monitor   -6/26 discontinue Colace and change MiraLAX  to as needed  -7/2 reports loose Bms, consider imodium PRN if continues   7/4 LBM was 7/2, not having further diarrhea.  Continue to monitor bowel function.  Continue as needed MiraLAX     LOS: 12 days A FACE TO FACE EVALUATION WAS PERFORMED  Levi [name] 11/28/2023, 11:24 AM

## 2023-11-28 NOTE — Plan of Care (Signed)
  Problem: Consults Goal: RH LIMB LOSS PATIENT EDUCATION Description: Description: See Patient Education module for eduction specifics. Outcome: Progressing Goal: Skin Care Protocol Initiated - if Braden Score 18 or less Description: If consults are not indicated, leave blank or document N/A Outcome: Progressing   Problem: RH BOWEL ELIMINATION Goal: RH STG MANAGE BOWEL WITH ASSISTANCE Description: STG Manage Bowel with toileting Assistance. Outcome: Progressing Goal: RH STG MANAGE BOWEL W/MEDICATION W/ASSISTANCE Description: STG Manage Bowel with Medication with mod I Assistance. Outcome: Progressing   Problem: RH BLADDER ELIMINATION Goal: RH STG MANAGE BLADDER WITH ASSISTANCE Description: STG Manage Bladder With  toileting Assistance Outcome: Progressing Goal: RH STG MANAGE BLADDER WITH MEDICATION WITH ASSISTANCE Description: STG Manage Bladder With Medication With mod I Assistance. Outcome: Progressing   Problem: RH SKIN INTEGRITY Goal: RH STG MAINTAIN SKIN INTEGRITY WITH ASSISTANCE Description: STG Maintain Skin Integrity With min Assistance. Outcome: Progressing Goal: RH STG ABLE TO PERFORM INCISION/WOUND CARE W/ASSISTANCE Description: STG Able To Perform Incision/Wound Care With min Assistance. Outcome: Progressing   Problem: RH SAFETY Goal: RH STG ADHERE TO SAFETY PRECAUTIONS W/ASSISTANCE/DEVICE Description: STG Adhere to Safety Precautions With cues Assistance/Device. Outcome: Progressing Goal: RH STG DECREASED RISK OF FALL WITH ASSISTANCE Description: STG Decreased Risk of Fall With cues Assistance. Outcome: Progressing   Problem: RH PAIN MANAGEMENT Goal: RH STG PAIN MANAGED AT OR BELOW PT'S PAIN GOAL Description: < 4 with prns Outcome: Progressing   Problem: RH KNOWLEDGE DEFICIT LIMB LOSS Goal: RH STG INCREASE KNOWLEDGE OF SELF CARE AFTER LIMB LOSS Description: Patient and wife will be able to manage care at discharge using educational resources for medication,  skin care, dietary ,modification independently Outcome: Progressing

## 2023-11-28 NOTE — Progress Notes (Signed)
 Physical Therapy Session Note  Patient Details  Name: Levi [name] MRN: [id number] Date of Birth: Sep 27, 1945  Today's Date: 11/28/2023 PT Individual Time: 0800-0914 PT Individual Time Calculation (min): 74 min   Short Term Goals: Week 2:  PT Short Term Goal 1 (Week 2): pt caregiver will complete and return home measurement sheet PT Short Term Goal 2 (Week 2): pt will perform sit to stand with LRAD and mod A  Skilled Therapeutic Interventions/Progress Updates:      Pt supine asleep in bed upon arrival. Pt awoken and agreeable to therapy. Pt reports R shoulder pain 8/10, requesting pain medication, notified nursing.    Pt performed supine to sit with use of bed features with supervision, verbal cues provided for postural control for correction of posterior bias.   Pt donned shirt while seated EOB with supervision, verbal cues provided for correction of posterior bias.   PT assisted with feeding pants for time/energy conservation. Pt stood x2 with RW and mod A from elevated bed. Pt donned pants over buttocks with total A while standing with RW and min A, verbal cues provided for upirhgt posture and correction of posterior bias.   Discontinued standing practice at this time 2/2 increased pain in R shoulder.   Pt performed squat pivot transfer bed to WC to R with mod A with +2 to stabilize WC, max verbal cues provided for anterior weight shift.   Pt transported dependent in [facility] outside. Treatment session held outside for pt overall mood, quality of life.   Pt navigated inclined sidewalk (ramp) with min A progressing to supervision with use of B UE/L LE, verbal cues provided for technique with navigating uphill terrain and obstacle negotiation. Pt demonstrates improved ease with navigating uphill backwards versus forwards.   Pt picked up cones at various heights from [facility] level. Pt demonstrates improved understanding of items appropriate to reach with UE versus items requiring reacher for pt  overall safety. Education provided for positioning WC to side and locking brakes prior to reaching for pt overall safety and to reduce fall risk.   Pt seated in WC at end of session with all needs within reach and chair alarm on.           Therapy Documentation Precautions:  Precautions Precautions: Fall Recall of Precautions/Restrictions: Intact Other Brace: RLE ampushield Restrictions Weight Bearing Restrictions Per Provider Order: No RLE Weight Bearing Per Provider Order: Non weight bearing Other Position/Activity Restrictions: no pillow under R knee     Therapy/Group: Individual Therapy  [facility] Doreene Orris, [place], DPT  11/28/2023, 7:48 AM

## 2023-11-28 NOTE — Progress Notes (Signed)
 Occupational Therapy Session Note  Patient Details  Name: Levi [name] MRN: [id number] Date of Birth: Oct 12, 1945  Session 1: Today's Date: 11/28/2023 OT Individual Time: 8994-8897 OT Individual Time Calculation (min): 57 min   Session 2: Today's Date: 11/28/2023 OT Individual Time: 8565-8464 OT Individual Time Calculation (min): 61 min   Short Term Goals: Week 2:  OT Short Term Goal 1 (Week 2): (P) patient will complete slide board transfers to wc, bed, and mat table with min A including placement on slide board OT Short Term Goal 2 (Week 2): (P) patient will complete toileting on bsc over toilet with modA  Session 1: Skilled Therapeutic Interventions/Progress Updates:    Patient agreeable to participate in OT session. Reports 7/10 pain level.   Patient participated in skilled OT session focusing on dynamic sitting balance, core strengthening, UE strength and ROM. Patient able to complete reaching anterioriorly, laterally activities with increased bending to promote increased IND with transfers, functional mobility, and tasks such as toileting. Patient demonstrated increased core and dynamic stability while in wheelchair to increase slide board transfer placement due to difficulties. Patient able to demo increased reaching to end of residual limb to increase donning of limb protector. Patient returned to room all needs in reach alarm on.   Session 2: Skilled Therapeutic Interventions/Progress Updates:    Patient agreeable to participate in OT session. Reports 7/10 pain level.   Patient participated in skilled OT session focusing on ADL/ toilet use, transfers, and sit to stand/ standing tolerance. Patient able to complete toilet transfer min A. Toileitng mod A for clothing management. Patient completed stand pivot transfer trial total A. Sit to stand from rw in room x4 with max A with several verbal cues for positioning and safety. Completed patient and caregiver education on safety,  mobility, and cueing required. Patient left in room all needs in reach, alarm on.   Therapy Documentation Precautions:  Precautions Precautions: Fall Recall of Precautions/Restrictions: Intact Other Brace: RLE ampushield Restrictions Weight Bearing Restrictions Per Provider Order: No RLE Weight Bearing Per Provider Order: Non weight bearing Other Position/Activity Restrictions: no pillow under R knee   Therapy/Group: Individual Therapy  [name] OTR/L  11/28/2023, 7:47 AM

## 2023-11-28 NOTE — Plan of Care (Signed)
  Problem: Consults Goal: RH LIMB LOSS PATIENT EDUCATION Description: Description: See Patient Education module for eduction specifics. Outcome: Progressing   Problem: RH BOWEL ELIMINATION Goal: RH STG MANAGE BOWEL WITH ASSISTANCE Description: STG Manage Bowel with toileting Assistance. Outcome: Progressing   Problem: RH BLADDER ELIMINATION Goal: RH STG MANAGE BLADDER WITH ASSISTANCE Description: STG Manage Bladder With  toileting Assistance Outcome: Progressing

## 2023-11-29 DIAGNOSIS — K5901 Slow transit constipation: Secondary | ICD-10-CM

## 2023-11-29 NOTE — Progress Notes (Signed)
 PROGRESS NOTE   Subjective/Complaints:  Pt doing well, slept well, pain manageable, LBM yesterday, urinating fine, no other complaints or concerns.     ROS: Patient denies Chills, N/V/D/C shortness of breath or chest pain, headache, or mood change. + muscle spasms-improved + chronic R shoulder pain- continued  Objective:   No results found.  Recent Labs    11/27/23 0502  WBC 10.2  HGB 9.6*  HCT 31.3*  PLT 336    Recent Labs    11/27/23 0502  NA 140  K 3.8  CL 103  CO2 27  GLUCOSE 89  BUN 25*  CREATININE 0.80  CALCIUM  8.9    Intake/Output Summary (Last 24 hours) at 11/29/2023 1121 Last data filed at 11/29/2023 0810 Gross per 24 hour  Intake 1026 ml  Output 825 ml  Net 201 ml        Physical Exam: Vital Signs Blood pressure 137/70, pulse 70, temperature 98 F (36.7 C), resp. rate 19, height 6' 2 (1.88 m), weight 89 kg, SpO2 100%.   Constitution: No acute distress,Sitting in WC in his room getting cleaned up Resp: CTAB, nonlabored breathing, on RA Cardio: RRR Abdomen: Nondistended. Nontender.  + Bowel sounds Psych: Appropriate mood and affect. Pleasant Skin: BKA with shrinker in place   Left foot with dry, flaking skin throughout; soft, boggy heels without apparent breakdown, Blanchable areas of erythema on top of 1st and 5th toes--heel covered 7/5   PRIOR EXAMS: Neuro: AAOx4. No apparent cognitive deficits  Sensory exam: Reduced sensation to light touch in distal left lower extremity Motor exam: strength 5/5 throughout bilateral upper extremities, bilateral lower extremities, and with exception of right shoulder 4/5   Prior neuro assessment is c/w today's exam 11/29/2023.  MSK: Limited range of motion of right shoulder Right shoulder limited in flexion, abduction to less than 100 degrees.  No TTP. Pain with shoulder abduction, internal and external rotation.  Prior second toe  amputation.     Assessment/Plan: 1. Functional deficits which require 3+ hours per day of interdisciplinary therapy in a comprehensive inpatient rehab setting. Physiatrist is providing close team supervision and 24 hour management of active medical problems listed below. Physiatrist and rehab team continue to assess barriers to discharge/monitor patient progress toward functional and medical goals  Care Tool:  Bathing    Body parts bathed by patient: Right arm, Left arm, Chest, Abdomen, Right upper leg, Left upper leg, Face, Front perineal area   Body parts bathed by helper: Buttocks, Right upper leg Body parts n/a: Right lower leg   Bathing assist Assist Level: Moderate Assistance - Patient 50 - 74%     Upper Body Dressing/Undressing Upper body dressing   What is the patient wearing?: Pull over shirt    Upper body assist Assist Level: Minimal Assistance - Patient > 75%    Lower Body Dressing/Undressing Lower body dressing      What is the patient wearing?: Pants, Incontinence brief     Lower body assist Assist for lower body dressing: Total Assistance - Patient < 25%     Toileting Toileting    Toileting assist Assist for toileting: Total Assistance - Patient < 25%  Transfers Chair/bed transfer  Transfers assist  Chair/bed transfer activity did not occur: Safety/medical concerns  Chair/bed transfer assist level: Supervision/Verbal cueing (slide board transfer)     Locomotion Ambulation   Ambulation assist   Ambulation activity did not occur: Safety/medical concerns          Walk 10 feet activity   Assist  Walk 10 feet activity did not occur: Safety/medical concerns        Walk 50 feet activity   Assist Walk 50 feet with 2 turns activity did not occur: Safety/medical concerns         Walk 150 feet activity   Assist Walk 150 feet activity did not occur: Safety/medical concerns         Walk 10 feet on uneven surface   activity   Assist Walk 10 feet on uneven surfaces activity did not occur: Safety/medical concerns         Wheelchair     Assist Is the patient using a wheelchair?: Yes Type of Wheelchair: Manual Wheelchair activity did not occur: Safety/medical concerns  Wheelchair assist level: Supervision/Verbal cueing Max wheelchair distance: 150+    Wheelchair 50 feet with 2 turns activity    Assist    Wheelchair 50 feet with 2 turns activity did not occur: Safety/medical concerns   Assist Level: Supervision/Verbal cueing   Wheelchair 150 feet activity     Assist  Wheelchair 150 feet activity did not occur: Safety/medical concerns   Assist Level: Supervision/Verbal cueing   Blood pressure 137/70, pulse 70, temperature 98 F (36.7 C), resp. rate 19, height 6' 2 (1.88 m), weight 89 kg, SpO2 100%.  Medical Problem List and Plan: 1. Functional deficits secondary to R BKA d/t osteomyelitis 11/14/23 by Dr. Harden, hx of charcot joint             -patient may not shower until wound vac removed             -ELOS/Goals: 16-18 days              -f/up with Dr. Harden 1wk after d/c, continue prevena plus wound vac             - Continue CIR  -Expected discharge 7/11  -Wound vac discontinued- monitor incision   2.  Antithrombotics: -DVT/anticoagulation:  Pharmaceutical: Eliquis  5mg  BID             -antiplatelet therapy: n/a 3. Pain Management/DDD/neuropathy: tylenol  and oxycodone  PRN, flexeril  PRN, voltaren  PRN, Lyrica  100mg  BID, Fioricet  PRN  -6/24 Advised trying PRN flexeril  for muscle spasms  -6/25 used flexeril  last night, pain is controlled, continue current regimen  -6/27 occasional use of flexeril , continue current. Pain controlled overall, could consider trial decrease lyrica  since pain doing well  6/28 not requiring recent flexeril , pain overall controlled  6/29 Voltaren  for right shoulder pain scheduled  7/1 Xray R shoulder- OA and subluxation humeral head  Consider  shoulder cortisone injection -discussed with patient, shoulder function is improving we will hold off on injection for now  7/4 phantom sensation present but not much phantom pain, patient reports he did mirror therapy.  Continue to monitor for now  4. Mood/Behavior/Sleep: No concerns             -antipsychotic agents: n/a 5. Neuropsych/cognition: This patient is capable of making decisions on his own behalf.  7/1 patient was seen by neuropsychology yesterday for coping and adjustment, appreciate assistance 6. Skin/Wound Care: routine skin checks, continue wound vac R BKA,  wound care LUE wound -Add Eucerin cream to left foot daily 6/30: Plan to remove wound VAC today as above 7/1 Discuss wound vac removal with nursing Vac has been removed, monitor incision   7. Fluids/Electrolytes/Nutrition/hypokalemia/hyponatremia: routine I&O, routine labs, continue vitamins/supplements, continue Kdur 20mEq daily  -6/23 give additional 20 mEq of potassium today  6/25 K+ up to 3.7, continue supplementation and monitor  6/27 give additional 20meq today for K+ 3.4  6/30: K 3.3; potassium chloride  increased from 20 mill equivalents daily to 40 meq daily    7/3 potassium stable at 3.8, recheck Monday  8. PAF on Eliquis  and Diltiazem  180mg  daily  - Heart rate controlled 9. HFpEF (EF 40-45% 02/2023): resume home Lasix  40mg  daily, daily weights-ordered Filed Weights   11/27/23 0500 11/28/23 0500 11/29/23 0545  Weight: 86.5 kg 85.4 kg 89 kg  -6/25 wt a little lower, monitor tend, decrease lasix  to 20mg  daily, Cr was a little higher at 0.71/BUN 16, recheck friday -6/29 No signs of overload, continue to monitor weight and recheck labs tomorrow 6/30: unreliable; no external signs of volume overload.  Continue current regimen. 7/4 weights appear stable past few days, no signs of overload -11/29/23 wt up but could be bed inconsistency, monitor trend.   10. HTN: Continue Diltiazem  as above, home Imdur  on hold,  resume as appropriate    11/29/2023    8:50 AM 11/29/2023    5:45 AM 11/29/2023    5:09 AM  Vitals with BMI  Weight  196 lbs 3 oz   BMI  25.18   Systolic   137  Diastolic   70  Pulse 70  49  7/1-5 BP controlled continue to monitor  11. HLD: continue Lipitor 20mg  daily 12. COPD/agent orange exposure: continue chronic Prednisone  20mg  daily, continue Brovana /Pulmicort  nebs, Mucinex  PRN, Duonebs PRN 13. BPH: continue Flomax  0.4mg  nightly 14. GERD: continue Protonix  40mg  BID, Maalox PRN 15. ?Gout/elevated uric acid: home med Allopurinol  100mg  qAM on hold, restart as appropriate.  16. Anemia, acute on chronic: Hgb 9-10 range during acute stay, monitor labs  6/30: HgB 9.5; stable  7/3 hemoglobin stable at 9.6  Recheck monday  17. Vitamin B12 deficiency: 1000mcg SQ weekly ordered during admission, f/up on transition to oral per primary team 18. Constipation: LBM day of admission, continue Colace 100mg  BID and Miralax  daily  - 6/25 LBM today, continue to monitor   -6/26 discontinue Colace and change MiraLAX  to as needed  -7/2 reports loose Bms, consider imodium PRN if continues   7/4 LBM was 7/2, not having further diarrhea.  Continue to monitor bowel function.  Continue as needed MiraLAX   -11/29/23 LBM yesterday, monitor  LOS: 13 days A FACE TO FACE EVALUATION WAS PERFORMED  [address] 11/29/2023, 11:21 AM

## 2023-11-29 NOTE — Progress Notes (Signed)
 Occupational Therapy Session Note  Patient Details  Name: Levi [name] MRN: [id number] Date of Birth: 1945-08-18  Today's Date: 11/29/2023 OT Individual Time: 0900-1000 OT Individual Time Calculation (min): 60 min    Short Term Goals: Week 1:  OT Short Term Goal 1 (Week 1): Pt will transfer to the toilet/ BSC with mod A +1 OT Short Term Goal 1 - Progress (Week 1): Met OT Short Term Goal 2 (Week 1): Pt will be able to perform sit to stands in prep for ADL tasks with mod A +1 consistenly OT Short Term Goal 2 - Progress (Week 1): Progressing toward goal OT Short Term Goal 3 (Week 1): Pt will perform LB dressing with AEprn with mod A OT Short Term Goal 3 - Progress (Week 1): Met OT Short Term Goal 4 (Week 1): Pt will perform bed mobility in prep for ADL tasks without bed features with supervision OT Short Term Goal 4 - Progress (Week 1): Progressing toward goal  Skilled Therapeutic Interventions/Progress Updates:    Patient resting in bed at the time of arrival with family.  Patient indicated that he rested during night with  and that he had no  pain  to report at the time of treatment.  The  patient  was in agreement with completing BADL related task in bathing and dressing at sink LOF. Patient was able to transfer from supine in bed to EOB with MinA. The pt was able to transfer from EOB to w/c LOF using the SB at [facility].  The pt was transported to the sink and was able to remove his over head shirt with MinA.  The pt was able to bathe his UB and apply deodorant with s/uA. The pt was able to donn his over head shirt with closeS. The pt was able to donn his non-skid sock using the sock aid with MinA after demonstration. The pt went on to complete UB towel exercises 2 sets of 10 for shld flexion and shld rotation below the point of pain.  At the end of the session, the pt remained at w/c LOF with the call light and bedside table within reach. All additional needs were addressed prior to exiting the  room.  Therapy Documentation Precautions:  Precautions Precautions: Fall Recall of Precautions/Restrictions: Intact Other Brace: RLE ampushield Restrictions Weight Bearing Restrictions Per Provider Order: Yes RLE Weight Bearing Per Provider Order: Weight bearing as tolerated Other Position/Activity Restrictions: no pillow under R knee  Therapy/Group: Individual Therapy  Elvera JONETTA Mace 11/29/2023, 3:52 PM

## 2023-11-29 NOTE — Progress Notes (Signed)
 Physical Therapy Session Note  Patient Details  Name: Levi [name] MRN: [id number] Date of Birth: 1945/12/15  Today's Date: 11/29/2023 PT Individual Time: 0417-0532 PT Individual Time Calculation (min): 75 min   Short Term Goals: Week 2:  PT Short Term Goal 1 (Week 2): pt caregiver will complete and return home measurement sheet PT Short Term Goal 2 (Week 2): pt will perform sit to stand with LRAD and mod A  Skilled Therapeutic Interventions/Progress Updates:   Pt received sitting in wheelchair; denies pain and is agreeable to PT.   Therex: W/C mobility performed down hallways and obstacles with focus on strengthening and alternating between retro propulsion and fwd propulsion > 300 ft. LAQ R x 20, L 2.5 lbs x 20; seated windshield wiper R x 20 and L 2.5lbs x 20; L iso LAQ with ankle pump x 20; seated L LE ext iso with 4-5sec hold x 20; wheelchair mobility >300 ft throughout hospital and up/down ramp forward and backward for anterior and posterior chain strengthening. UBE Ues only L1 x 10 min for continued UE strengthening and endurance to assist with w/c propulsion endurance.  Theract: Reacher practice from w/c level picking up various items from the floor all weights and sizes. Pt able to perform all except one flat object that was more sticky to the floor.  Pt left seated in wheelchair in room; dinner being served by CNA. Denies pain. All needs within reach. All activities focused on postural control as well.      Therapy Documentation Precautions:  Precautions Precautions: Fall Recall of Precautions/Restrictions: Intact Other Brace: RLE ampushield Restrictions Weight Bearing Restrictions Per Provider Order: Yes RLE Weight Bearing Per Provider Order: Weight bearing as tolerated Other Position/Activity Restrictions: no pillow under R knee  Therapy/Group: Individual Therapy  Alger Ada 11/29/2023, 12:36 PM

## 2023-11-30 ENCOUNTER — Other Ambulatory Visit: Payer: Self-pay | Admitting: Cardiology

## 2023-11-30 NOTE — Progress Notes (Signed)
 PRN flexeril  given at 1954. Blanchable redness noted to big toe on left foot. Foam dressing applied. Changed foam dressing to left heel. Added foam dressing to big toe. BKA incision with staples in place and shrinker. Bottom of left foot with thick dry skin. SCD applied to LLE. Prevalon boot to left foot. Marena Witts A

## 2023-11-30 NOTE — Plan of Care (Signed)
  Problem: Consults Goal: RH LIMB LOSS PATIENT EDUCATION Description: Description: See Patient Education module for eduction specifics. Outcome: Progressing Goal: Skin Care Protocol Initiated - if Braden Score 18 or less Description: If consults are not indicated, leave blank or document N/A Outcome: Progressing   Problem: RH BOWEL ELIMINATION Goal: RH STG MANAGE BOWEL WITH ASSISTANCE Description: STG Manage Bowel with toileting Assistance. Outcome: Progressing Goal: RH STG MANAGE BOWEL W/MEDICATION W/ASSISTANCE Description: STG Manage Bowel with Medication with mod I Assistance. Outcome: Progressing   Problem: RH BLADDER ELIMINATION Goal: RH STG MANAGE BLADDER WITH ASSISTANCE Description: STG Manage Bladder With  toileting Assistance Outcome: Progressing Goal: RH STG MANAGE BLADDER WITH MEDICATION WITH ASSISTANCE Description: STG Manage Bladder With Medication With mod I Assistance. Outcome: Progressing   Problem: RH SKIN INTEGRITY Goal: RH STG MAINTAIN SKIN INTEGRITY WITH ASSISTANCE Description: STG Maintain Skin Integrity With min Assistance. Outcome: Progressing Goal: RH STG ABLE TO PERFORM INCISION/WOUND CARE W/ASSISTANCE Description: STG Able To Perform Incision/Wound Care With min Assistance. Outcome: Progressing   Problem: RH SAFETY Goal: RH STG ADHERE TO SAFETY PRECAUTIONS W/ASSISTANCE/DEVICE Description: STG Adhere to Safety Precautions With cues Assistance/Device. Outcome: Progressing Goal: RH STG DECREASED RISK OF FALL WITH ASSISTANCE Description: STG Decreased Risk of Fall With cues Assistance. Outcome: Progressing   Problem: RH PAIN MANAGEMENT Goal: RH STG PAIN MANAGED AT OR BELOW PT'S PAIN GOAL Description: < 4 with prns Outcome: Progressing   Problem: RH KNOWLEDGE DEFICIT LIMB LOSS Goal: RH STG INCREASE KNOWLEDGE OF SELF CARE AFTER LIMB LOSS Description: Patient and wife will be able to manage care at discharge using educational resources for medication,  skin care, dietary ,modification independently Outcome: Progressing

## 2023-11-30 NOTE — Plan of Care (Signed)
  Problem: RH BOWEL ELIMINATION Goal: RH STG MANAGE BOWEL WITH ASSISTANCE Description: STG Manage Bowel with toileting Assistance. Flowsheets (Taken 11/30/2023 0420) STG: Pt will manage bowels with assistance: 4-Minimum assistance Goal: RH STG MANAGE BOWEL W/MEDICATION W/ASSISTANCE Description: STG Manage Bowel with Medication with mod I Assistance. Flowsheets (Taken 11/30/2023 0420) STG: Pt will manage bowels with medication with assistance: 7-Independent   Problem: RH BLADDER ELIMINATION Goal: RH STG MANAGE BLADDER WITH ASSISTANCE Description: STG Manage Bladder With  toileting Assistance Flowsheets (Taken 11/30/2023 0420) STG: Pt will manage bladder with assistance: 6-Modified independent Goal: RH STG MANAGE BLADDER WITH MEDICATION WITH ASSISTANCE Description: STG Manage Bladder With Medication With mod I Assistance. Flowsheets (Taken 11/30/2023 0420) STG: Pt will manage bladder with medication with assistance: 7-Independent   Problem: RH SKIN INTEGRITY Goal: RH STG MAINTAIN SKIN INTEGRITY WITH ASSISTANCE Description: STG Maintain Skin Integrity With min Assistance. Flowsheets (Taken 11/30/2023 0420) STG: Maintain skin integrity with assistance: 4-Minimal assistance Goal: RH STG ABLE TO PERFORM INCISION/WOUND CARE W/ASSISTANCE Description: STG Able To Perform Incision/Wound Care With min Assistance. Flowsheets (Taken 11/30/2023 0420) STG: Pt will be able to perform incision/wound care with assistance: 4-Minimal assistance   Problem: RH SAFETY Goal: RH STG ADHERE TO SAFETY PRECAUTIONS W/ASSISTANCE/DEVICE Description: STG Adhere to Safety Precautions With cues Assistance/Device. Flowsheets (Taken 11/30/2023 0420) STG:Pt will adhere to safety precautions with assistance/device: 5-Supervision/cueing Goal: RH STG DECREASED RISK OF FALL WITH ASSISTANCE Description: STG Decreased Risk of Fall With cues Assistance. Flowsheets (Taken 11/30/2023 0420) DUH:Izrmzjdzi risk of fall  with  assistance/device: 5-Supervision/cueing

## 2023-11-30 NOTE — Progress Notes (Signed)
 PROGRESS NOTE   Subjective/Complaints:  Pt doing well again today, slept well, pain manageable, LBM last night per pt but not documented, urinating fine, no other complaints or concerns.     ROS: Patient denies Chills, abd pain, N/V/D/C shortness of breath or chest pain, headache, or mood change. + muscle spasms-improved + chronic R shoulder pain- continued  Objective:   No results found.  No results for input(s): WBC, HGB, HCT, PLT in the last 72 hours.   No results for input(s): NA, K, CL, CO2, GLUCOSE, BUN, CREATININE, CALCIUM  in the last 72 hours.   Intake/Output Summary (Last 24 hours) at 11/30/2023 1036 Last data filed at 11/30/2023 0735 Gross per 24 hour  Intake 916 ml  Output 450 ml  Net 466 ml        Physical Exam: Vital Signs Blood pressure 121/65, pulse 70, temperature 97.6 F (36.4 C), resp. rate 15, height 6' 2 (1.88 m), weight 96.3 kg, SpO2 99%.   Constitution: No acute distress, resting in bed comfortably Resp: CTAB, nonlabored breathing, on RA Cardio: RRR Abdomen: Nondistended. Nontender.  + Bowel sounds Psych: Appropriate mood and affect. Pleasant Skin: BKA with shrinker in place   Left foot with dry, flaking skin throughout; soft, boggy heels without apparent breakdown, Blanchable areas of erythema on top of 1st and 5th toes--heel covered 7/6   PRIOR EXAMS: Neuro: AAOx4. No apparent cognitive deficits  Sensory exam: Reduced sensation to light touch in distal left lower extremity Motor exam: strength 5/5 throughout bilateral upper extremities, bilateral lower extremities, and with exception of right shoulder 4/5   Prior neuro assessment is c/w today's exam 11/30/2023.  MSK: Limited range of motion of right shoulder Right shoulder limited in flexion, abduction to less than 100 degrees.  No TTP. Pain with shoulder abduction, internal and external rotation.  Prior second  toe amputation.     Assessment/Plan: 1. Functional deficits which require 3+ hours per day of interdisciplinary therapy in a comprehensive inpatient rehab setting. Physiatrist is providing close team supervision and 24 hour management of active medical problems listed below. Physiatrist and rehab team continue to assess barriers to discharge/monitor patient progress toward functional and medical goals  Care Tool:  Bathing    Body parts bathed by patient: Right arm, Left arm, Chest, Abdomen, Right upper leg, Left upper leg, Face, Front perineal area   Body parts bathed by helper: Buttocks, Right upper leg Body parts n/a: Right lower leg   Bathing assist Assist Level: Moderate Assistance - Patient 50 - 74%     Upper Body Dressing/Undressing Upper body dressing   What is the patient wearing?: Pull over shirt    Upper body assist Assist Level: Minimal Assistance - Patient > 75%    Lower Body Dressing/Undressing Lower body dressing      What is the patient wearing?: Pants, Incontinence brief     Lower body assist Assist for lower body dressing: Total Assistance - Patient < 25%     Toileting Toileting    Toileting assist Assist for toileting: Total Assistance - Patient < 25%     Transfers Chair/bed transfer  Transfers assist  Chair/bed transfer activity did not occur:  Safety/medical concerns  Chair/bed transfer assist level: Supervision/Verbal cueing (slide board transfer)     Locomotion Ambulation   Ambulation assist   Ambulation activity did not occur: Safety/medical concerns          Walk 10 feet activity   Assist  Walk 10 feet activity did not occur: Safety/medical concerns        Walk 50 feet activity   Assist Walk 50 feet with 2 turns activity did not occur: Safety/medical concerns         Walk 150 feet activity   Assist Walk 150 feet activity did not occur: Safety/medical concerns         Walk 10 feet on uneven surface   activity   Assist Walk 10 feet on uneven surfaces activity did not occur: Safety/medical concerns         Wheelchair     Assist Is the patient using a wheelchair?: Yes Type of Wheelchair: Manual Wheelchair activity did not occur: Safety/medical concerns  Wheelchair assist level: Supervision/Verbal cueing Max wheelchair distance: 150+    Wheelchair 50 feet with 2 turns activity    Assist    Wheelchair 50 feet with 2 turns activity did not occur: Safety/medical concerns   Assist Level: Supervision/Verbal cueing   Wheelchair 150 feet activity     Assist  Wheelchair 150 feet activity did not occur: Safety/medical concerns   Assist Level: Supervision/Verbal cueing   Blood pressure 121/65, pulse 70, temperature 97.6 F (36.4 C), resp. rate 15, height 6' 2 (1.88 m), weight 96.3 kg, SpO2 99%.  Medical Problem List and Plan: 1. Functional deficits secondary to R BKA d/t osteomyelitis 11/14/23 by Dr. Harden, hx of charcot joint             -patient may not shower until wound vac removed             -ELOS/Goals: 16-18 days              -f/up with Dr. Harden 1wk after d/c, continue prevena plus wound vac             - Continue CIR  -Expected discharge 7/11  -Wound vac discontinued- monitor incision   2.  Antithrombotics: -DVT/anticoagulation:  Pharmaceutical: Eliquis  5mg  BID             -antiplatelet therapy: n/a 3. Pain Management/DDD/neuropathy: tylenol  and oxycodone  PRN, flexeril  PRN, voltaren  PRN, Lyrica  100mg  BID, Fioricet  PRN  -6/24 Advised trying PRN flexeril  for muscle spasms  -6/25 used flexeril  last night, pain is controlled, continue current regimen  -6/27 occasional use of flexeril , continue current. Pain controlled overall, could consider trial decrease lyrica  since pain doing well  6/28 not requiring recent flexeril , pain overall controlled  6/29 Voltaren  for right shoulder pain scheduled  7/1 Xray R shoulder- OA and subluxation humeral head  Consider  shoulder cortisone injection -discussed with patient, shoulder function is improving we will hold off on injection for now  7/4 phantom sensation present but not much phantom pain, patient reports he did mirror therapy.  Continue to monitor for now  4. Mood/Behavior/Sleep: No concerns             -antipsychotic agents: n/a 5. Neuropsych/cognition: This patient is capable of making decisions on his own behalf.  7/1 patient was seen by neuropsychology yesterday for coping and adjustment, appreciate assistance 6. Skin/Wound Care: routine skin checks, continue wound vac R BKA, wound care LUE wound -Add Eucerin cream to left foot daily 6/30: Plan  to remove wound VAC today as above 7/1 Discuss wound vac removal with nursing Vac has been removed, monitor incision   7. Fluids/Electrolytes/Nutrition/hypokalemia/hyponatremia: routine I&O, routine labs, continue vitamins/supplements, continue Kdur 20mEq daily  -6/23 give additional 20 mEq of potassium today  6/25 K+ up to 3.7, continue supplementation and monitor  6/27 give additional 20meq today for K+ 3.4  6/30: K 3.3; potassium chloride  increased from 20 mill equivalents daily to 40 meq daily    7/3 potassium stable at 3.8, recheck Monday  8. PAF on Eliquis  and Diltiazem  180mg  daily  - Heart rate controlled 9. HFpEF (EF 40-45% 02/2023): resume home Lasix  40mg  daily, daily weights-ordered Filed Weights   11/28/23 0500 11/29/23 0545 11/30/23 0500  Weight: 85.4 kg 89 kg 96.3 kg  -6/25 wt a little lower, monitor tend, decrease lasix  to 20mg  daily, Cr was a little higher at 0.71/BUN 16, recheck friday -6/29 No signs of overload, continue to monitor weight and recheck labs tomorrow 6/30: unreliable; no external signs of volume overload.  Continue current regimen. 7/4 weights appear stable past few days, no signs of overload -11/29/23 wt up but could be bed inconsistency, monitor trend.  -11/30/23 wt up but again, not overloaded appearing, suspect very  inaccurate bed weight.   10. HTN: Continue Diltiazem  as above, home Imdur  on hold, resume as appropriate    11/30/2023    8:43 AM 11/30/2023    5:25 AM 11/30/2023    5:00 AM  Vitals with BMI  Weight   212 lbs 5 oz  BMI   27.25  Systolic 121 137   Diastolic 65 87   Pulse  70   7/1-6 BP controlled continue to monitor  11. HLD: continue Lipitor 20mg  daily 12. COPD/agent orange exposure: continue chronic Prednisone  20mg  daily, continue Brovana /Pulmicort  nebs, Mucinex  PRN, Duonebs PRN 13. BPH: continue Flomax  0.4mg  nightly 14. GERD: continue Protonix  40mg  BID, Maalox PRN 15. ?Gout/elevated uric acid: home med Allopurinol  100mg  qAM on hold, restart as appropriate.  16. Anemia, acute on chronic: Hgb 9-10 range during acute stay, monitor labs  6/30: HgB 9.5; stable  7/3 hemoglobin stable at 9.6  Recheck monday  17. Vitamin B12 deficiency: 1000mcg SQ weekly ordered during admission, f/up on transition to oral per primary team  18. Constipation: LBM day of admission, continue Colace 100mg  BID and Miralax  daily  - 6/25 LBM today, continue to monitor   -6/26 discontinue Colace and change MiraLAX  to as needed  -7/2 reports loose Bms, consider imodium PRN if continues  7/4 LBM was 7/2, not having further diarrhea.  Continue to monitor bowel function.  Continue as needed MiraLAX   -11/29/23 LBM yesterday, monitor -11/30/23 LBM yesterday per pt but not documented; if no documented BM by tomorrow, would consider adding gentle stool softener  LOS: 14 days A FACE TO FACE EVALUATION WAS PERFORMED  [address] 11/30/2023, 10:36 AM

## 2023-12-01 DIAGNOSIS — M19011 Primary osteoarthritis, right shoulder: Secondary | ICD-10-CM

## 2023-12-01 DIAGNOSIS — I5022 Chronic systolic (congestive) heart failure: Secondary | ICD-10-CM

## 2023-12-01 DIAGNOSIS — M86271 Subacute osteomyelitis, right ankle and foot: Secondary | ICD-10-CM

## 2023-12-01 DIAGNOSIS — I480 Paroxysmal atrial fibrillation: Secondary | ICD-10-CM

## 2023-12-01 LAB — CBC
HCT: 31.6 % — ABNORMAL LOW (ref 39.0–52.0)
Hemoglobin: 9.7 g/dL — ABNORMAL LOW (ref 13.0–17.0)
MCH: 29 pg (ref 26.0–34.0)
MCHC: 30.7 g/dL (ref 30.0–36.0)
MCV: 94.6 fL (ref 80.0–100.0)
Platelets: 249 K/uL (ref 150–400)
RBC: 3.34 MIL/uL — ABNORMAL LOW (ref 4.22–5.81)
RDW: 16.3 % — ABNORMAL HIGH (ref 11.5–15.5)
WBC: 10.2 K/uL (ref 4.0–10.5)
nRBC: 0 % (ref 0.0–0.2)

## 2023-12-01 LAB — BASIC METABOLIC PANEL WITH GFR
Anion gap: 9 (ref 5–15)
BUN: 29 mg/dL — ABNORMAL HIGH (ref 8–23)
CO2: 26 mmol/L (ref 22–32)
Calcium: 9 mg/dL (ref 8.9–10.3)
Chloride: 103 mmol/L (ref 98–111)
Creatinine, Ser: 0.65 mg/dL (ref 0.61–1.24)
GFR, Estimated: >60 mL/min (ref >60)
Glucose, Bld: 82 mg/dL (ref 70–99)
Potassium: 3.4 mmol/L — ABNORMAL LOW (ref 3.5–5.1)
Sodium: 138 mmol/L (ref 135–145)

## 2023-12-01 NOTE — Progress Notes (Signed)
 Patient ID: [name], male   DOB: [birth year], 78 y.o.   MRN: [id number] Have ordered hospital bed, wheelchair and transfer board via [employer] who will reach out to wife to set up delivery. Wife here participating in therapies with husband

## 2023-12-01 NOTE — Plan of Care (Signed)
  Problem: Sit to Stand Goal: LTG:  Patient will perform sit to stand in prep for activites of daily living with assistance level (OT) Description: LTG:  Patient will perform sit to stand in prep for activites of daily living with assistance level (OT) Flowsheets (Taken 12/01/2023 1245) LTG: PT will perform sit to stand in prep for activites of daily living with assistance level: (downgraded due to functional progress) Moderate Assistance - Patient 50 - 74%

## 2023-12-01 NOTE — Progress Notes (Signed)
 Occupational Therapy Session Note  Patient Details  Name: Levi [name] MRN: [id number] Date of Birth: 1945-07-12  Today's Date: 12/01/2023 OT Individual Time: 1410-1435 OT Individual Time Calculation (min): 25 min    Short Term Goals: Week 1:  OT Short Term Goal 1 (Week 1): Pt will transfer to the toilet/ BSC with mod A +1 OT Short Term Goal 1 - Progress (Week 1): Met OT Short Term Goal 2 (Week 1): Pt will be able to perform sit to stands in prep for ADL tasks with mod A +1 consistenly OT Short Term Goal 2 - Progress (Week 1): Progressing toward goal OT Short Term Goal 3 (Week 1): Pt will perform LB dressing with AEprn with mod A OT Short Term Goal 3 - Progress (Week 1): Met OT Short Term Goal 4 (Week 1): Pt will perform bed mobility in prep for ADL tasks without bed features with supervision OT Short Term Goal 4 - Progress (Week 1): Met Week 2:  OT Short Term Goal 1 (Week 2): patient will complete slide board transfers to wc, bed, and mat table with min A including placement on slide board OT Short Term Goal 1 - Progress (Week 2): Met OT Short Term Goal 2 (Week 2): patient will complete toileting on bsc over toilet with modA OT Short Term Goal 2 - Progress (Week 2): Met Week 3:  OT Short Term Goal 1 (Week 3): STG=LTG d/t ELOS  Skilled Therapeutic Interventions/Progress Updates:    Pt received in w/c with wife and family in the room. Pt agreeable to working on techniques to develop his ability to complete squat pivot transfers more easily.  Pt used slide board from Wc to bed to his R with close supervision and cues to keep R fingers flat vs curling under board.  From bed - worked on forward wt shift to push up with B hands and lift hips off bed.  Pt able to do 10 in a row and then added in isometric holds (with hips lifted ) of 2 counts progressing to 3 counts.    With chair positioned at end of bed had pt practice board transfer back to his R side but with cues to scoot with hip lift vs  sliding.  Pt did well with this with only CGA.  Then tried a squat pivot transfer to his L back to the bed with cues to look to R , lean forward and complete transfer in 2-3 increments.  Pt needed heavy mod A as he was having difficulty swiveling on L foot.   From bed he was able to practice scooting from end of bed to the [facility] with good hip lift and forward lean and close S.  Pt would benefit from practice with learning to put more wt through ball of foot to allow for an easier swivel. From EOB he worked on sit to stands with mod -max A to push up with B hands from bed, then transfer R hand to RW stand taller than L hand.  3x total with last transfer only mod A.  Pt worked on each stand for 20-30 seconds extending L leg fully.  Pt resting EOB with family present in room.     Therapy Documentation Precautions:  Precautions Precautions: Fall Recall of Precautions/Restrictions: Intact Other Brace: RLE ampushield Restrictions Weight Bearing Restrictions Per Provider Order: Yes RLE Weight Bearing Per Provider Order: Weight bearing as tolerated Other Position/Activity Restrictions: no pillow under R knee    Vital Signs:  Therapy Vitals Temp: 97.8 F (36.6 C) Temp Source: Oral Pulse Rate: 91 Resp: 18 BP: 123/82 Patient Position (if appropriate): Sitting Oxygen Therapy SpO2: 98 % O2 Device: Room Air Pain:  No c/o pain  ADL: ADL Grooming: Minimal assistance Where Assessed-Grooming: Sitting at sink Upper Body Bathing: Contact guard Where Assessed-Upper Body Bathing: Edge of bed Lower Body Bathing: Maximal assistance Where Assessed-Lower Body Bathing: Edge of bed Upper Body Dressing: Minimal assistance Where Assessed-Upper Body Dressing: Edge of bed Lower Body Dressing: Maximal assistance Where Assessed-Lower Body Dressing: Edge of bed    Therapy/Group: Individual Therapy  Eleanor Dimichele 12/01/2023, 2:52 PM

## 2023-12-01 NOTE — Progress Notes (Signed)
 Physical Therapy Session Note  Patient Details  Name: Levi [name] MRN: [id number] Date of Birth: 01-14-1946  Today's Date: 12/01/2023 PT Individual Time: 0800-0900, 1300-1329 PT Individual Time Calculation (min): 60 min, 29 min   Short Term Goals: Week 2:  PT Short Term Goal 1 (Week 2): pt caregiver will complete and return home measurement sheet PT Short Term Goal 2 (Week 2): pt will perform sit to stand with LRAD and mod A  Skilled Therapeutic Interventions/Progress Updates:       Treatment Session 1  Pt sitting EOB upon arrival. Pt agreeable to therapy. Pt denies any pain at rest, pt reports R arm pain with mobility.   PT answered questions related to questions/concerns regarding discharging on 7/11. PT recommending discharging with use of slide board transfer and supervision for pt safety. Pt verbalized understanding and agreeable.   Pt donned and doffed shrinker with supervision. Pt reports pt wife cleaned shrinker and donned new one yesterday.   Pt donned limb protector while sitting EOB with supervision. Recommended pt fold velcro straps over one another when doffing for improved ease when donning.   Pt donned L shoe with increased time and min A with use of shoe horn.   Pt able to direct therapist how to position WC next to bed for slide board transfer with questioning cues for sequencing. Pt placed and removed board with increased time and supervision, verbal cues provided for lateral trunk lean to L for proper positioning of slide board. Pt performed with supervision. Pt required assist to donn leg rests.   Pt opened door to room (swinging inward) at [facility] level with supervision. Verbal cues provided for technique.   Pt wife returned pt home measurement sheet. Height of pt car 29 inches. Pt set car simulator at height of 29 inches. Unsafe to use slide board 2/2 significant up hill transfer. Contacted pt wife via phone during session. Pt reports it is a lower level SUV, and  perhaps she measured it wrong. Plan to trail transfer to pt personal car 7/8 at 11 am. Notified scheduler.   Pt attemtped squat pivot transfer to 25 inch high mat table in the instance pt vehicle is 29 inches high. Pt required +2 A for safety.   Pt seated in [facility] with all needs within reach and chair alarm on.   Treatment Session 2  Pt seated in WC upon arrival. Pt agreeable to therpay. Pt denies any pain.   Squat pivot transfer to L with max progressing to heavy mod A with use of UE on bed rail, max verbal and tactile cues provided for technique. +2 provided to stabilize WC.   Pt performed sit to stand from elevated hospital bed with RW and min A x3.   Pt performed L LE heel raises x8 with RW and CGA, verbal cues provided for keeping L LE knee extended.   Pt raised unilateral UE off of RW x5 B with RW and CGA/min A.   Pt performed stand pivot transfer x1 to R with +2 A for safety.   Pt seated in WC at end of session with all needs within reach and bed alarm on.    Therapy Documentation Precautions:  Precautions Precautions: Fall Recall of Precautions/Restrictions: Intact Other Brace: RLE ampushield Restrictions Weight Bearing Restrictions Per Provider Order: Yes RLE Weight Bearing Per Provider Order: Weight bearing as tolerated Other Position/Activity Restrictions: no pillow under R knee  Therapy/Group: Individual Therapy  Doreene Orris Doreene Orris, PT, DPT  12/01/2023, 7:46 AM

## 2023-12-01 NOTE — Progress Notes (Signed)
 Nutrition Follow-up  DOCUMENTATION CODES:   Non-severe (moderate) malnutrition in context of chronic illness  INTERVENTION:   Continue Juven 1 packet po BID, each packet provides 80 calories, 8 grams of carbohydrate, 2.5  grams of protein (collagen), 7 grams of L-arginine and 7 grams of L-glutamine; supplement contains CaHMB, Vitamins C, E, B12 and Zinc  to promote wound healing. Continue MVI with minerals po once daily. Continue Prosource Plus decreased to 30 ml PO once daily, each packet provides 100 kcal and 15 gm protein Encourage intake of meals and supplements to support healing. D/C HS snack daily, patient states he has not been receiving and doesn't like to eat late at night.    NUTRITION DIAGNOSIS:   Moderate Malnutrition related to chronic illness (COPD) as evidenced by mild muscle depletion, mild fat depletion.  Ongoing   GOAL:   Patient will meet greater than or equal to 90% of their needs  Progressing   MONITOR:   PO intake, Supplement acceptance, Skin  REASON FOR ASSESSMENT:   Consult Wound healing  ASSESSMENT:   78 yo male admitted with functional decline s/p R BKA 11/14/23. PMH includes PAF, CHF, hypothyroidism, HLD, COPD, agent orange exposure, BPH, GERD, anemia.  Remains on a heart healthy diet; meal intakes 50-100% for the past few days. He is taking Prosource Plus BID and Juven BID. He is tired of the Prosource Plus, will decrease to once daily. He says he has not been receiving his HS snack. RD looked and snack for last night still in the refrigerator. Patient states he does not want a snack anymore, because he doesn't like to eat late at night.    Labs reviewed. K 3.4  Medications reviewed and include vitamin B12 injections 1,000 mcg weekly, fergon, lasix , MVI with minerals, Juven, protonix , klor-con , prednisone , flomax .  CIR admit weight: 90.2 kg Current weight: 87.5 kg   Diet Order:   Diet Order             Diet Heart Room service appropriate?  Yes; Fluid consistency: Thin  Diet effective now                   EDUCATION NEEDS:   Education needs have been addressed  Skin:  Skin Assessment: Skin Integrity Issues: Skin Integrity Issues:: Other (Comment) Wound Vac: removed Other: R BKA 11/14/23  Last BM:  7/6 type 2  Height:   Ht Readings from Last 1 Encounters:  11/16/23 6' 2 (1.88 m)    Weight:   Wt Readings from Last 1 Encounters:  12/01/23 87.5 kg    Ideal Body Weight:  80.8 kg (adjusted for BKA)  BMI:  Body mass index is 26.48 kg/m (adjusted for BKA)  Estimated Nutritional Needs:   Kcal:  2100-2300  Protein:  125-140 gm  Fluid:  2.1-2.3 L   Suzen HUNT RD, LDN, CNSC Contact via secure chat. If unavailable, use group chat RD Inpatient.

## 2023-12-01 NOTE — Discharge Instructions (Addendum)
 Inpatient Rehab Discharge Instructions  Levi [name] Discharge date and time: 12/04/23   Activities/Precautions/ Functional Status: Activity: no lifting, driving, or strenuous exercise for till cleared by MD Diet: cardiac diet Wound Care: keep wound clean and dry. Contact Dr. Harden if you develop any problems with your incision/wound--redness, swelling, increase in pain, drainage or if you develop fever or chills.    Functional status:  ___ No restrictions     ___ Walk up steps independently _X__ 24/7 supervision/assistance   ___ Walk up steps with assistance ___ Intermittent supervision/assistance  ___ Bathe/dress independently ___ Walk with walker     __X_ Bathe/dress with assistance ___ Walk Independently    ___ Shower independently ___ Walk with assistance    ___ Shower with assistance _X__ No alcohol     ___ Return to work/school ________   Special Instructions: Follow up with Primary care regarding need for Vitamin B 12 injections.     COMMUNITY REFERRALS UPON DISCHARGE:    Home Health:   PT      OT        RN    [name]                Agency: Phone:    Medical Equipment/Items Ordered: HOSPITAL BED, 30 TRANSFER [name], [place]                                                 Agency/Supplier:[facility]   [phone]   My questions have been answered and I understand these instructions. I will adhere to these goals and the provided educational materials after my discharge from the hospital.  Patient/Caregiver Signature _______________________________ Date __________  Clinician Signature _______________________________________ Date __________  Please bring this form and your medication list with you to all your follow-up doctor's appointments.

## 2023-12-01 NOTE — Progress Notes (Addendum)
 Occupational Therapy Weekly Progress Note  Patient Details  Name: Levi [name] MRN: [id number] Date of Birth: 09/21/1945  Beginning of progress report period: November 24, 2023 End of progress report period: December 01, 2023  Session 1: Today's Date: 12/01/2023 OT Individual Time: 8982-8898 OT Individual Time Calculation (min): 44 min   Session 2: Today's Date: 12/01/2023 OT Individual Time: 8553-8464 OT Individual Time Calculation (min): 49 min    Patient has met 3 of 4 short term goals.  Patient continues to demonstrated deficits with standing due to weakness in LE and weakness and pain in UE. Patient continues to be educated on core strengthening and importance of core musculature for transfers/ standing. Mod to max in parallel bars depending on strength. Patient able to complete ADLs sup to mod A with verbal cues due to decreased recall of taught strategies.   Patient continues to demonstrate the following deficits: muscle weakness, decreased cardiorespiratoy endurance, and decreased sitting balance, decreased standing balance, decreased postural control, and decreased balance strategies and therefore will continue to benefit from skilled OT intervention to enhance overall performance with BADL and Reduce care partner burden.  Patient progressing toward long term goals..  Plan of care revisions: downgraded sit to stand goal due to functional capacity. .  OT Short Term Goals Week 1:  OT Short Term Goal 1 (Week 1): Pt will transfer to the toilet/ BSC with mod A +1 OT Short Term Goal 1 - Progress (Week 1): Met OT Short Term Goal 2 (Week 1): Pt will be able to perform sit to stands in prep for ADL tasks with mod A +1 consistenly OT Short Term Goal 2 - Progress (Week 1): Progressing toward goal OT Short Term Goal 3 (Week 1): Pt will perform LB dressing with AEprn with mod A OT Short Term Goal 3 - Progress (Week 1): Met OT Short Term Goal 4 (Week 1): Pt will perform bed mobility in prep for ADL  tasks without bed features with supervision OT Short Term Goal 4 - Progress (Week 1): Progressing toward goal Week 2:  OT Short Term Goal 1 (Week 2): patient will complete slide board transfers to wc, bed, and mat table with min A including placement on slide board OT Short Term Goal 1 - Progress (Week 2): Met OT Short Term Goal 2 (Week 2): patient will complete toileting on bsc over toilet with modA OT Short Term Goal 2 - Progress (Week 2): Met Week 3:  OT Short Term Goal 1 (Week 3): STG=LTG d/t ELOS  Session 1: Skilled Therapeutic Interventions/Progress Updates:  Patient agreeable to participate in OT session. Reports 3/10 pain level in shoulder.   Patient participated in skilled OT session focusing on functional mobility via slide board, core strengthening, placement, and caregiver education. Patient completed functional slide board transfers including placement with CG and mod verbal cues to both sides to increase safety in home with transfers. Patient and caregiver completed slide board to ensure safety and caregiver has proper training on slide boarding. Patient able to self propel in wheelchair with increased efficiency which will be main mode of transportation. Therapist facilitated/assessed patient and caregiver ability to complete safe transfers/ mobility in order to improve independence and safety in home.     Session 2: Skilled Therapeutic Interventions/Progress Updates:  Patient agreeable to participate in OT session. Reports no pain level.   Patient participated in skilled OT session focusing on sit to stands, toe weight shifitng, scooting foot in preparation for stand pivot transfers. Patient  minA to max A to stand x8 with max verbal cues on foot placement, lean, positioning, residual limb placement. Patient assist level inconsistent secondary to fatigue and poor cue recall. Patient able to complete pivot of foot in parallel bars with mod A with OT. Patient returned to room all  needs in reach, alarm on.   Therapy Documentation Precautions:  Precautions Precautions: Fall Recall of Precautions/Restrictions: Intact Other Brace: RLE ampushield Restrictions Weight Bearing Restrictions Per Provider Order: Yes RLE Weight Bearing Per Provider Order: Weight bearing as tolerated Other Position/Activity Restrictions: no pillow under R knee   Therapy/Group: Individual Therapy  D'mariea L Majorie Santee 12/01/2023, 7:57 AM

## 2023-12-01 NOTE — Progress Notes (Signed)
 PROGRESS NOTE   Subjective/Complaints:  Ongoing right shoulder pain. No better or worse. Seems to be getting through activities with therapy at present. Minimal pain in right leg.  ROS: Patient denies fever, rash, sore throat, blurred vision, dizziness, nausea, vomiting, diarrhea, cough, shortness of breath or chest pain,   headache, or mood change.   Objective:   No results found.  Recent Labs    12/01/23 0516  WBC 10.2  HGB 9.7*  HCT 31.6*  PLT 249     Recent Labs    12/01/23 0516  NA 138  K 3.4*  CL 103  CO2 26  GLUCOSE 82  BUN 29*  CREATININE 0.65  CALCIUM  9.0     Intake/Output Summary (Last 24 hours) at 12/01/2023 1108 Last data filed at 12/01/2023 0731 Gross per 24 hour  Intake 917 ml  Output 275 ml  Net 642 ml        Physical Exam: Vital Signs Blood pressure (!) 145/62, pulse 64, temperature (!) 97.5 F (36.4 C), temperature source Oral, resp. rate 18, height 6' 2 (1.88 m), weight 87.5 kg, SpO2 98%.   Constitutional: No distress . Vital signs reviewed. HEENT: NCAT, EOMI, oral membranes moist Neck: supple Cardiovascular: RRR without murmur. No JVD    Respiratory/Chest: CTA Bilaterally without wheezes or rales. Normal effort    GI/Abdomen: BS +, non-tender, non-distended Ext: no clubbing, cyanosis, or edema Psych: pleasant and cooperative  Skin: BKA with shrinker in place, LG in place.  Incision approximated   Left foot with dry, flaking skin throughout; soft, boggy heels without apparent breakdown, Blanchable areas of erythema on top of 1st and 5th toes 7/7.      Neuro: AAOx4. No apparent cognitive deficits  Sensory exam: Reduced sensation to light touch in distal left lower extremity Motor exam: strength 5/5 throughout bilateral upper extremities, bilateral lower extremities, and with exception of right shoulder 4/5  MSK: Limited range of motion of right shoulder Right shoulder limited  in flexion, abduction to less than 100 degrees. Most of pain is with IR and severe ABD. Prior second toe amputation.   Assessment/Plan: 1. Functional deficits which require 3+ hours per day of interdisciplinary therapy in a comprehensive inpatient rehab setting. Physiatrist is providing close team supervision and 24 hour management of active medical problems listed below. Physiatrist and rehab team continue to assess barriers to discharge/monitor patient progress toward functional and medical goals  Care Tool:  Bathing    Body parts bathed by patient: Right arm, Left arm, Chest, Abdomen, Right upper leg, Left upper leg, Face, Front perineal area   Body parts bathed by helper: Buttocks, Right upper leg Body parts n/a: Right lower leg   Bathing assist Assist Level: Moderate Assistance - Patient 50 - 74%     Upper Body Dressing/Undressing Upper body dressing   What is the patient wearing?: Pull over shirt    Upper body assist Assist Level: Minimal Assistance - Patient > 75%    Lower Body Dressing/Undressing Lower body dressing      What is the patient wearing?: Pants, Incontinence brief     Lower body assist Assist for lower body dressing: Total Assistance -  Patient < 25%     Toileting Toileting    Toileting assist Assist for toileting: Total Assistance - Patient < 25%     Transfers Chair/bed transfer  Transfers assist  Chair/bed transfer activity did not occur: Safety/medical concerns  Chair/bed transfer assist level: Supervision/Verbal cueing (slide board transfer)     Locomotion Ambulation   Ambulation assist   Ambulation activity did not occur: Safety/medical concerns          Walk 10 feet activity   Assist  Walk 10 feet activity did not occur: Safety/medical concerns        Walk 50 feet activity   Assist Walk 50 feet with 2 turns activity did not occur: Safety/medical concerns         Walk 150 feet activity   Assist Walk 150 feet  activity did not occur: Safety/medical concerns         Walk 10 feet on uneven surface  activity   Assist Walk 10 feet on uneven surfaces activity did not occur: Safety/medical concerns         Wheelchair     Assist Is the patient using a wheelchair?: Yes Type of Wheelchair: Manual Wheelchair activity did not occur: Safety/medical concerns  Wheelchair assist level: Supervision/Verbal cueing Max wheelchair distance: 150+    Wheelchair 50 feet with 2 turns activity    Assist    Wheelchair 50 feet with 2 turns activity did not occur: Safety/medical concerns   Assist Level: Supervision/Verbal cueing   Wheelchair 150 feet activity     Assist  Wheelchair 150 feet activity did not occur: Safety/medical concerns   Assist Level: Supervision/Verbal cueing   Blood pressure (!) 145/62, pulse 64, temperature (!) 97.5 F (36.4 C), temperature source Oral, resp. rate 18, height 6' 2 (1.88 m), weight 87.5 kg, SpO2 98%.  Medical Problem List and Plan: 1. Functional deficits secondary to R BKA d/t osteomyelitis 11/14/23 by Dr. Harden, hx of charcot joint             -patient may not shower until wound vac removed             -ELOS/Goals: 16-18 days              -f/up with Dr. Harden 1wk after d/c, continue prevena plus wound vac             - Continue CIR  -Expected discharge 7/11  -Wound vac discontinued- monitor incision   2.  Antithrombotics: -DVT/anticoagulation:  Pharmaceutical: Eliquis  5mg  BID             -antiplatelet therapy: n/a 3. Pain Management/DDD/neuropathy: tylenol  and oxycodone  PRN, flexeril  PRN, voltaren  PRN, Lyrica  100mg  BID, Fioricet  PRN  -6/29 Voltaren  for right shoulder pain scheduled  7/1 Xray R shoulder- OA and subluxation humeral head  Consider shoulder cortisone injection    7/7 continue above. Minimal RLE pain at present  4. Mood/Behavior/Sleep: No concerns             -antipsychotic agents: n/a 5. Neuropsych/cognition: This patient is  capable of making decisions on his own behalf.  7/1 patient was seen by neuropsychology yesterday for coping and adjustment, appreciate assistance 6. Skin/Wound Care: routine skin checks, continue wound vac R BKA, wound care LUE wound -Add Eucerin cream to left foot daily 6/30: Plan to remove wound VAC today as above 7/1 Discuss wound vac removal with nursing Vac has been removed, incision appears CDI   7. Fluids/Electrolytes/Nutrition/hypokalemia/hyponatremia: routine I&O, routine labs, continue vitamins/supplements,  continue Kdur 20mEq daily  -6/23 give additional 20 mEq of potassium today  6/25 K+ up to 3.7, continue supplementation and monitor  6/27 give additional 20meq today for K+ 3.4  6/30: K 3.3; potassium chloride  increased from 20 mill equivalents daily to 40 meq daily    7/3 potassium stable at 3.8, recheck Monday  8. PAF on Eliquis  and Diltiazem  180mg  daily  - Heart rate controlled 9. HFpEF (EF 40-45% 02/2023): resume home Lasix  40mg  daily, daily weights-ordered Filed Weights   11/29/23 0545 11/30/23 0500 12/01/23 0431  Weight: 89 kg 96.3 kg 87.5 kg  -6/25 wt a little lower, monitor tend, decrease lasix  to 20mg  daily, Cr was a little higher at 0.71/BUN 16, recheck friday -6/29 No signs of overload, continue to monitor weight and recheck labs tomorrow 6/30: unreliable; no external signs of volume overload.  Continue current regimen. 7/4 weights appear stable past few days, no signs of overload -11/29/23 wt up but could be bed inconsistency, monitor trend.  -7/7 weight improved. Question accuracy of yesterday  10. HTN: Continue Diltiazem  as above, home Imdur  on hold, resume as appropriate    12/01/2023    4:39 AM 12/01/2023    4:31 AM 11/30/2023    8:16 PM  Vitals with BMI  Weight  192 lbs 14 oz   BMI  24.76   Systolic 145  136  Diastolic 62  76  Pulse 64  45  7/7-6 BP controlled continue to monitor  11. HLD: continue Lipitor 20mg  daily 12. COPD/agent orange exposure:  continue chronic Prednisone  20mg  daily, continue Brovana /Pulmicort  nebs, Mucinex  PRN, Duonebs PRN 13. BPH: continue Flomax  0.4mg  nightly 14. GERD: continue Protonix  40mg  BID, Maalox PRN 15. ?Gout/elevated uric acid: home med Allopurinol  100mg  qAM on hold, restart as appropriate.  16. Anemia, acute on chronic: Hgb 9-10 range during acute stay, monitor labs  6/30: HgB 9.5; stable  7/7 hgb stable at 9.7  17. Vitamin B12 deficiency: 1000mcg SQ weekly ordered during admission,---transition to oral at discharge  18. Constipation: LBM day of admission, continue Colace 100mg  BID and Miralax  daily  - 6/25 LBM today, continue to monitor   -6/26 discontinue Colace and change MiraLAX  to as needed  -7/2 reports loose Bms, consider imodium PRN if continues  7/4 LBM was 7/2, not having further diarrhea.  Continue to monitor bowel function.  Continue as needed MiraLAX   -11/29/23 LBM yesterday, monitor -11/30/23 LBM --continue above  LOS: 15 days A FACE TO FACE EVALUATION WAS PERFORMED  Arthea ONEIDA Gunther 12/01/2023, 11:08 AM

## 2023-12-02 NOTE — Progress Notes (Signed)
 Occupational Therapy Session Note  Patient Details  Name: [name] MRN: [id number] Date of Birth: [birth year]  Today's Date: 12/02/2023 OT Individual Time: 1030-1055 OT Individual Time Calculation (min): 25 min    Short Term Goals: Week 3:  OT Short Term Goal 1 (Week 3): STG=LTG d/t ELOS  Skilled Therapeutic Interventions/Progress Updates:    Skilled OT intervention with focus on w/c mobility and BUE strengthening to increase independence with BADLs and mobility. Pt propelled w/c 50'x2 with verbal cues for technique. BUE therex with 4# bar bells-biceps curls 4x10 with rest breaks. Ongoing discharge planning. Pt returned to room and remained in w/c with all needs within reach.   Therapy Documentation Precautions:  Precautions Precautions: Fall Recall of Precautions/Restrictions: Intact Other Brace: RLE ampushield Restrictions Weight Bearing Restrictions Per Provider Order: Yes RLE Weight Bearing Per Provider Order: Weight bearing as tolerated Other Position/Activity Restrictions: no pillow under R knee Pain:  Pt denies pain this morning  Therapy/Group: Individual Therapy  Maritza Debby Mare 12/02/2023, 12:00 PM

## 2023-12-02 NOTE — Progress Notes (Signed)
 Occupational Therapy Session Note  Patient Details  Name: Levi [name] MRN: [id number] Date of Birth: 06-Jul-1945  Today's Date: 12/02/2023 OT Individual Time: 9099-9044 OT Individual Time Calculation (min): 55 min    Short Term Goals: Week 3:  OT Short Term Goal 1 (Week 3): STG=LTG d/t ELOS  Skilled Therapeutic Interventions/Progress Updates:  Skilled OT intervention completed with focus on ADL retraining, functional transfers, DC planning. Pt received upright in bed with nursing present, agreeable to session. No pain reported.  Pt requested to get to sink for freshening up. Transitioned EOB with mod I and use of bed rails. Consistently required mod cueing throughout for all transitions, transfers, and tasks for sequencing and safety. Donned limb guard with supervision, min A to donn L shoe, board placement and transfer on board with supervision > w/c.  Seated at sink, pt completed UB bathing, grooming, dressing and oral care with set up A. Transported dependently > bariatric DABSC. Completed supervision/CGA lateral scoot > BSC with cues for anterior weight shifting. Required significant time to lower clothing but only supervision A. Continent of urinary void and BM. Mod I with self-hygiene. Required max cues for coordinating lateral leans with re-donning brief and pants and ultimately min A at buttocks with pt pushing up on arm rests of BSC due to fatigue. Discussed that pt may benefit from using brief at night for independence with urinal and clothing management, but then using pull up during day when OOB for ease with donning over hips in seated position. CGA lateral scoot > w/c. Set up A for hand hygiene.  Self-propelled about 150 ft with mod I; cues for bigger pushes to conserve energy but limitations on LUE noted due to RTC injury. Seated in, pt completed the following BUE exercises to promote BUE strength/endurance needed for independence with functional transfers and BADLs: (With 4 lb  dumbbell) -2x12 bicep curls each arm  Transported back to room. Pt remained seated in w/c with direct care handoff to NT present for bed linens, with all needs in reach at end of session.   Therapy Documentation Precautions:  Precautions Precautions: Fall Recall of Precautions/Restrictions: Intact Other Brace: RLE ampushield Restrictions Weight Bearing Restrictions Per Provider Order: Yes RLE Weight Bearing Per Provider Order: Weight bearing as tolerated Other Position/Activity Restrictions: no pillow under R knee    Therapy/Group: Individual Therapy  Kailena Lubas E Fidelia Cathers, MS, OTR/L  12/02/2023, 10:03 AM

## 2023-12-02 NOTE — Progress Notes (Signed)
 Physical Therapy Weekly Progress Note  Patient Details  Name: Levi [name] MRN: [id number] Date of Birth: 06-24-1945  Beginning of progress report period: November 25, 2023 End of progress report period: December 02, 2023  Today's Date: 12/02/2023 PT Individual Time: 1416-1500, 8897-8840 PT Individual Time Calculation (min): 44 min, 57 min  Patient has met 2 of 2 short term goals.  Pt is making great progress towards LTGs. Pt is performing bed mobility with mod I with hospital bed features. Slide board transfer with supervision. Uphill slide board transfer to personal car with CGA. WC mobility with supervision with B UE/L LE.   Patient continues to demonstrate the following deficits muscle weakness and muscle joint tightness, decreased cardiorespiratoy endurance, and decreased sitting balance, decreased standing balance, decreased postural control, decreased balance strategies, and difficulty maintaining precautions and therefore will continue to benefit from skilled PT intervention to increase functional independence with mobility.  Patient progressing toward long term goals..  Continue plan of care.  PT Short Term Goals Week 2:  PT Short Term Goal 1 (Week 2): pt caregiver will complete and return home measurement sheet PT Short Term Goal 1 - Progress (Week 2): Met PT Short Term Goal 2 (Week 2): pt will perform sit to stand with LRAD and mod A PT Short Term Goal 2 - Progress (Week 2): Met Week 3:  PT Short Term Goal 1 (Week 3): STG=LTG 2/2 ELOS  Skilled Therapeutic Interventions/Progress Updates:      Treatment Session 1  Pt seated in WC upon arrival. Pt agreeable to therapy. Pt denies any pain.   Pt wife present with pt personal car to practice car transfer.   Pt performed uphill slide board transfer WC to car x3, with CGA, with assistance for placing slide board, max verbal cues provided for technique. Adjusted seat forward for transfer, then moved seat back once in seat to acoomodate for  leg space.   Pt and pt wife returned demonstration each way. Therapist provided video with verbal cues for each step for pt/family reference as needed.   Education provided to pt wife how to fold WC for transport.   Recommended pt schedule follow appointments in the morning when less fatigued for safety. Recommended plan accordingly to allow enough time/rest breaks. Pt verbalized understanding and agreeable.   Pt seated in [facility] with all needs within reach and chair alarm on.   Treatment Session 2   Pt seated in WC upon arrival. Pt agreeable to therapy. Pt denies any pain.   Pt wife present for continued family training.   Pt ascended/descended ramp with supervision in WC with B UE/L LE. Pt requires reminder to ascend backwards for improved ease/safety.   Followed up regarding limb protector, shrinker, skin inspection. Pt wife reports she has been cleaning the shrinker every other day. Pt returned demonstration of donning/doffing shrinker and limb protector. Pt and wife provided teach back of purpose and frequency of wear of limb protector and shrinker. Pt and wife provided teach back of purpose, frequency, and technique for skin inspection B. Pt and wife provided teach back of signs and symptoms of pressure injury/infection.        Therapy Documentation Precautions:  Precautions Precautions: Fall Recall of Precautions/Restrictions: Intact Other Brace: RLE ampushield Restrictions Weight Bearing Restrictions Per Provider Order: Yes RLE Weight Bearing Per Provider Order: Weight bearing as tolerated Other Position/Activity Restrictions: no pillow under R knee  Therapy/Group: Individual Therapy  [facility] [place], [place], DPT  12/02/2023, 8:02  AM

## 2023-12-02 NOTE — Progress Notes (Signed)
 PROGRESS NOTE   Subjective/Complaints:  No new issues. Dealing with right shoulder. Feels it's manageable at present. No new complaints  ROS: Patient denies fever, rash, sore throat, blurred vision, dizziness, nausea, vomiting, diarrhea, cough, shortness of breath or chest pain,  headache, or mood change.   Objective:   No results found.  Recent Labs    12/01/23 0516  WBC 10.2  HGB 9.7*  HCT 31.6*  PLT 249     Recent Labs    12/01/23 0516  NA 138  K 3.4*  CL 103  CO2 26  GLUCOSE 82  BUN 29*  CREATININE 0.65  CALCIUM  9.0     Intake/Output Summary (Last 24 hours) at 12/02/2023 1019 Last data filed at 12/02/2023 0700 Gross per 24 hour  Intake 696 ml  Output 1350 ml  Net -654 ml        Physical Exam: Vital Signs Blood pressure 138/67, pulse (!) 57, temperature 98 F (36.7 C), resp. rate 18, height 6' 2 (1.88 m), weight 87.5 kg, SpO2 99%.   Constitutional: No distress . Vital signs reviewed. HEENT: NCAT, EOMI, oral membranes moist Neck: supple Cardiovascular: RRR without murmur. No JVD    Respiratory/Chest: CTA Bilaterally without wheezes or rales. Normal effort    GI/Abdomen: BS +, non-tender, non-distended Ext: no clubbing, cyanosis, or edema Psych: pleasant and cooperative  Skin: BKA incision with staples, well approximated. No drainage.    Left foot with dry, flaking skin throughout; soft, boggy heels without apparent breakdown 7/8.      Neuro: AAOx4. No apparent cognitive deficits  Sensory exam: Reduced sensation to light touch in distal left lower extremity Motor exam: strength 5/5 throughout bilateral upper extremities, bilateral lower extremities, and with exception of right shoulder 4/5  MSK: Limited range of motion of right shoulder Right shoulder limited in flexion, abduction to less than 100 degrees. Most of pain is with IR and severe ABD. Prior second toe amputation. Right BK well  shaped   Assessment/Plan: 1. Functional deficits which require 3+ hours per day of interdisciplinary therapy in a comprehensive inpatient rehab setting. Physiatrist is providing close team supervision and 24 hour management of active medical problems listed below. Physiatrist and rehab team continue to assess barriers to discharge/monitor patient progress toward functional and medical goals  Care Tool:  Bathing    Body parts bathed by patient: Right arm, Left arm, Chest, Abdomen, Right upper leg, Left upper leg, Face, Front perineal area   Body parts bathed by helper: Buttocks, Right upper leg Body parts n/a: Right lower leg   Bathing assist Assist Level: Moderate Assistance - Patient 50 - 74%     Upper Body Dressing/Undressing Upper body dressing   What is the patient wearing?: Pull over shirt    Upper body assist Assist Level: Minimal Assistance - Patient > 75%    Lower Body Dressing/Undressing Lower body dressing      What is the patient wearing?: Pants, Incontinence brief     Lower body assist Assist for lower body dressing: Total Assistance - Patient < 25%     Toileting Toileting    Toileting assist Assist for toileting: Minimal Assistance - Patient >  75%     Transfers Chair/bed transfer  Transfers assist  Chair/bed transfer activity did not occur: Safety/medical concerns  Chair/bed transfer assist level: Supervision/Verbal cueing (slide board transfer)     Locomotion Ambulation   Ambulation assist   Ambulation activity did not occur: Safety/medical concerns          Walk 10 feet activity   Assist  Walk 10 feet activity did not occur: Safety/medical concerns        Walk 50 feet activity   Assist Walk 50 feet with 2 turns activity did not occur: Safety/medical concerns         Walk 150 feet activity   Assist Walk 150 feet activity did not occur: Safety/medical concerns         Walk 10 feet on uneven surface   activity   Assist Walk 10 feet on uneven surfaces activity did not occur: Safety/medical concerns         Wheelchair     Assist Is the patient using a wheelchair?: Yes Type of Wheelchair: Manual Wheelchair activity did not occur: Safety/medical concerns  Wheelchair assist level: Supervision/Verbal cueing Max wheelchair distance: 150+    Wheelchair 50 feet with 2 turns activity    Assist    Wheelchair 50 feet with 2 turns activity did not occur: Safety/medical concerns   Assist Level: Supervision/Verbal cueing   Wheelchair 150 feet activity     Assist  Wheelchair 150 feet activity did not occur: Safety/medical concerns   Assist Level: Supervision/Verbal cueing   Blood pressure 138/67, pulse (!) 57, temperature 98 F (36.7 C), resp. rate 18, height 6' 2 (1.88 m), weight 87.5 kg, SpO2 99%.  Medical Problem List and Plan: 1. Functional deficits secondary to R BKA d/t osteomyelitis 11/14/23 by Dr. Harden, hx of charcot joint             -patient may not shower until wound vac removed             -ELOS/Goals: 16-18 days              -f/up with Dr. Harden 1wk after d/c,              -Continue CIR therapies including PT, OT   -Expected discharge 7/11      2.  Antithrombotics: -DVT/anticoagulation:  Pharmaceutical: Eliquis  5mg  BID             -antiplatelet therapy: n/a 3. Pain Management/DDD/neuropathy: tylenol  and oxycodone  PRN, flexeril  PRN, voltaren  PRN, Lyrica  100mg  BID, Fioricet  PRN  -6/29 Voltaren  for right shoulder pain scheduled  7/1 Xray R shoulder- OA and subluxation humeral head  Consider shoulder cortisone injection    7/8 continue above. Minimal RLE pain at present  4. Mood/Behavior/Sleep: No concerns             -antipsychotic agents: n/a 5. Neuropsych/cognition: This patient is capable of making decisions on his own behalf.  7/1 patient was seen by neuropsychology yesterday for coping and adjustment, appreciate assistance 6. Skin/Wound Care:  routine skin checks, continue wound vac R BKA, wound care LUE wound -Add Eucerin cream to left foot daily 7/8 bk incision looks great==continue shrinker.    7. Fluids/Electrolytes/Nutrition/hypokalemia/hyponatremia: routine I&O, routine labs, continue vitamins/supplements, continue Kdur 20mEq daily  -6/23 give additional 20 mEq of potassium today  6/25 K+ up to 3.7, continue supplementation and monitor  6/27 give additional 20meq today for K+ 3.4  6/30: K 3.3; potassium chloride  increased from 20 mill equivalents daily to  40 meq daily    7/3 potassium stable at 3.8, recheck Monday  7/8- continues on 40meq daily potassium  8. PAF on Eliquis  and Diltiazem  180mg  daily  - Heart rate controlled 9. HFpEF (EF 40-45% 02/2023): resume home Lasix  40mg  daily, daily weights-ordered Filed Weights   11/30/23 0500 12/01/23 0431 12/02/23 0500  Weight: 96.3 kg 87.5 kg 87.5 kg  -6/25 wt a little lower, monitor tend, decrease lasix  to 20mg  daily, Cr was a little higher at 0.71/BUN 16, recheck friday -6/29 No signs of overload, continue to monitor weight and recheck labs tomorrow 6/30: unreliable; no external signs of volume overload.  Continue current regimen. 7/4 weights appear stable past few days, no signs of overload -11/29/23 wt up but could be bed inconsistency, monitor trend.  -7/8 weight stable/improved.   10. HTN: Continue Diltiazem  as above, home Imdur  on hold, resume as appropriate    12/02/2023    5:58 AM 12/02/2023    5:00 AM 12/01/2023    8:47 PM  Vitals with BMI  Weight  192 lbs 14 oz   BMI  24.76   Systolic 138    Diastolic 67    Pulse 57  58  7/8 BP controlled continue to monitor  11. HLD: continue Lipitor 20mg  daily 12. COPD/agent orange exposure: continue chronic Prednisone  20mg  daily, continue Brovana /Pulmicort  nebs, Mucinex  PRN, Duonebs PRN 13. BPH: continue Flomax  0.4mg  nightly 14. GERD: continue Protonix  40mg  BID, Maalox PRN 15. ?Gout/elevated uric acid: home med Allopurinol   100mg  qAM on hold, restart as appropriate.  16. Anemia, acute on chronic: Hgb 9-10 range during acute stay, monitor labs  6/30: HgB 9.5; stable  7/7 hgb stable at 9.7  17. Vitamin B12 deficiency: 1000mcg SQ weekly ordered during admission,---transition to oral at discharge  18. Constipation: LBM day of admission, continue Colace 100mg  BID and Miralax  daily  - 6/25 LBM today, continue to monitor   -6/26 discontinue Colace and change MiraLAX  to as needed  -7/2 reports loose Bms, consider imodium PRN if continues  7/4 LBM was 7/2, not having further diarrhea.  Continue to monitor bowel function.  Continue as needed MiraLAX   -11/29/23 LBM yesterday, monitor -11/30/23 LBM --continue above plan--advance if no bm today 7/8  LOS: 16 days A FACE TO FACE EVALUATION WAS PERFORMED  Levi [name] 12/02/2023, 10:19 AM

## 2023-12-03 ENCOUNTER — Telehealth: Payer: Self-pay | Admitting: Cardiology

## 2023-12-03 MED ORDER — ATORVASTATIN CALCIUM 20 MG PO TABS: 20.0000 mg | ORAL_TABLET | Freq: Every day | ORAL | 3 refills | Status: AC

## 2023-12-03 NOTE — Telephone Encounter (Signed)
 Pt is needing refill - will sent into [facility] as requested.  He is aware he will need updated Lipid panel at next blood draw.

## 2023-12-03 NOTE — Progress Notes (Signed)
 Occupational Therapy Session Note  Patient Details  Name: Levi [name] MRN: [id number] Date of Birth: Aug 07, 1945  Session 1: Today's Date: 12/03/2023 OT Individual Time: 9099-9054 OT Individual Time Calculation (min): 45 min   Session 2: Today's Date: 12/03/2023 OT Individual Time: 8669-8567 OT Individual Time Calculation (min): 62 min    Short Term Goals: Week 2:  OT Short Term Goal 1 (Week 2): patient will complete slide board transfers to wc, bed, and mat table with min A including placement on slide board OT Short Term Goal 1 - Progress (Week 2): Met OT Short Term Goal 2 (Week 2): patient will complete toileting on bsc over toilet with modA OT Short Term Goal 2 - Progress (Week 2): Met Week 3:  OT Short Term Goal 1 (Week 3): STG=LTG d/t ELOS  Session 1: Skilled Therapeutic Interventions/Progress Updates:    Completed family training with spouse. Therapist educated family regarding toileting, dressing, and slide boarding. Education also provided on strategies and compensatory techniques to use if patient demonstrates decreased balance or is unable to slide board correctly. Education provided for gait belt use and handling technique. Spouse completed hands on training for toileitng, dressing,  and slide board transfer with therapist providing VC as needed for technique and form. All education completed and all questions  answered.   Session 2: Skilled Therapeutic Interventions/Progress Updates:  Patient agreeable to participate in OT session. Reports 3/10 pain level.   Patient participated in skilled OT session focusing on sit to stand/ stand pivot transfers, caregiver training, and wc mobility. Patient max to total A for stand pivot. Mod to max for standing from wc and from bed. Max verbal cues for proper sequencing. Patient able to complete wc mobility with increased speed for approx 100 ft before fatigue. Patient and caregiver educated on importance of safety in home to minimize fall  risk. Returned to bed all needs met, wife in room.   Therapy Documentation Precautions:  Precautions Precautions: Fall Recall of Precautions/Restrictions: Intact Other Brace: RLE ampushield Restrictions Weight Bearing Restrictions Per Provider Order: Yes RLE Weight Bearing Per Provider Order: Weight bearing as tolerated Other Position/Activity Restrictions: no pillow under R knee   Therapy/Group: Individual Therapy  D'mariea L Tayte Childers 12/03/2023, 9:43 AM

## 2023-12-03 NOTE — Progress Notes (Signed)
 Physical Therapy Session Note  Patient Details  Name: Levi [name] MRN: [id number] Date of Birth: 07-Feb-1946  Today's Date: 12/03/2023 PT Individual Time: 9052-8955, 8552-8471 PT Individual Time Calculation (min): 57 min, 41 min   Short Term Goals: Week 3:  PT Short Term Goal 1 (Week 3): STG=LTG 2/2 ELOS  Skilled Therapeutic Interventions/Progress Updates:      Treatment Session 1 Pt supine in bed upon arrival. Pt agreeable to therapy. Pt denies any pain.   Pt wife present for family training.   Pt and wife provided teach back of pressure relief every 2 hours while in bed. Bed mobility mod I with use of bed features.   Pt and wife performed slSlide board transfer bed to [facility] x3 each direction with supervision with pt wife placing/removing slide board and managing part of the WC. Verbal cues provided initially to pt wife for proper positioning of board, pt and pt wife demonstrate improved carry over with repetition.   Pt and wife provided teach back of R LE NWB, and not leaning backwards.   Pt deny any questions concerns regarding car transfer, navigating ramp for home entry, navigating throughout house in [facility] (home measurement sheet indicates home is WC accessible), performing slide board transfer to bed, BSC, etc.   Pt and wife provided teach back of need to perform pressure relief every 30 minutes holding for 20 seconds while in WC. Pt demonstrates ability to perform with use of wheelchair push up and lateral trunk lean technique with supervision, verbal cues provided for technique. Recommended pt perform wheelchiar push up as able to tolerate and lateral trunk lean technique as needed with fatigue.   Pt seated in WC with all needs within reach and bed alarm on.   Treatment Session 2   Pt seated in WC upon arrival. Pt agreeable to therapy. Pt denies any pain.   Pt self propelled WC with supervision room to day room.   Pt participated in skilled community integration activity  focused on WC positioning, proper positioning.   Pt played corn hole from seated position in [facility], pt demonstrates improved awareness of items appropriate to pick up with hand versus reacher, and when to ask for assistance. Verbal cues provided for safety with WC positioning to the side, and locking brakes.   Pt performed sit to stand from [facility] with RW and +2 A, discontinued 2/2 R UE pain.   Pt seated in WC with all needs within reach and bed alarm on.         Therapy Documentation Precautions:  Precautions Precautions: Fall Recall of Precautions/Restrictions: Intact Other Brace: RLE ampushield Restrictions Weight Bearing Restrictions Per Provider Order: Yes RLE Weight Bearing Per Provider Order: Weight bearing as tolerated Other Position/Activity Restrictions: no pillow under R knee  Therapy/Group: Individual Therapy  [facility] Doreene Orris, [place], DPT  12/03/2023, 9:55 AM

## 2023-12-03 NOTE — Progress Notes (Signed)
 Patient ID: Levi [name], male   DOB: 1945/11/01, 78 y.o.   MRN: [id number] Met with pt to give team conference update and progress this week. Wife is doing hands on care and doing well. Pt wishes he was stronger and standing better. Bed to be delivered to home tomorrow and will check on wheelchair and slide board. Work toward discharge on Friday

## 2023-12-03 NOTE — Telephone Encounter (Signed)
 Pt c/o medication issue:  1. Name of Medication:   atorvastatin  (LIPITOR) 20 MG tablet    2. How are you currently taking this medication (dosage and times per day)?  TAKE 1 TABLET BY MOUTH EVERY DAY      3. Are you having a reaction (difficulty breathing--STAT)? No  4. What is your medication issue? Pt is requesting a callback regarding him having issues with getting this medication.

## 2023-12-03 NOTE — Progress Notes (Signed)
 PROGRESS NOTE   Subjective/Complaints:  Really doing quite well except for right shoulder. Happy to be getting home soon!  ROS: Patient denies fever, rash, sore throat, blurred vision, dizziness, nausea, vomiting, diarrhea, cough, shortness of breath or chest pain,  headache, or mood change.   Objective:   No results found.  Recent Labs    12/01/23 0516  WBC 10.2  HGB 9.7*  HCT 31.6*  PLT 249     Recent Labs    12/01/23 0516  NA 138  K 3.4*  CL 103  CO2 26  GLUCOSE 82  BUN 29*  CREATININE 0.65  CALCIUM  9.0     Intake/Output Summary (Last 24 hours) at 12/03/2023 0905 Last data filed at 12/03/2023 0727 Gross per 24 hour  Intake 636 ml  Output 1600 ml  Net -964 ml        Physical Exam: Vital Signs Blood pressure 123/71, pulse (!) 42, temperature 97.6 F (36.4 C), resp. rate 18, height 6' 2 (1.88 m), weight 82.8 kg, SpO2 98%.   Constitutional: No distress . Vital signs reviewed. HEENT: NCAT, EOMI, oral membranes moist Neck: supple Cardiovascular: RRR without murmur. No JVD    Respiratory/Chest: CTA Bilaterally without wheezes or rales. Normal effort    GI/Abdomen: BS +, non-tender, non-distended Ext: no clubbing, cyanosis, or edema Psych: pleasant and cooperative  Skin: BKA incision with staples, well approximated. No drainage.    Left foot with dry, flaking skin throughout;       Neuro: AAOx4. No apparent cognitive deficits  Sensory exam: Reduced sensation to light touch in distal left lower extremity Motor exam: strength 5/5 throughout bilateral upper extremities, bilateral lower extremities, and with exception of right shoulder 4/5  (pain)  MSK: Limited range of motion of right shoulder Right shoulder limited in flexion, abduction to less than 100 degrees. Most of pain is with IR and severe ABD. +crepitus Prior second toe amputation. Right BK well shaped   Assessment/Plan: 1. Functional  deficits which require 3+ hours per day of interdisciplinary therapy in a comprehensive inpatient rehab setting. Physiatrist is providing close team supervision and 24 hour management of active medical problems listed below. Physiatrist and rehab team continue to assess barriers to discharge/monitor patient progress toward functional and medical goals  Care Tool:  Bathing    Body parts bathed by patient: Right arm, Left arm, Chest, Abdomen, Right upper leg, Left upper leg, Face, Front perineal area   Body parts bathed by helper: Buttocks, Right upper leg Body parts n/a: Right lower leg   Bathing assist Assist Level: Moderate Assistance - Patient 50 - 74%     Upper Body Dressing/Undressing Upper body dressing   What is the patient wearing?: Pull over shirt    Upper body assist Assist Level: Minimal Assistance - Patient > 75%    Lower Body Dressing/Undressing Lower body dressing      What is the patient wearing?: Pants, Incontinence brief     Lower body assist Assist for lower body dressing: Total Assistance - Patient < 25%     Toileting Toileting    Toileting assist Assist for toileting: Minimal Assistance - Patient > 75%  Transfers Chair/bed transfer  Transfers assist  Chair/bed transfer activity did not occur: Safety/medical concerns  Chair/bed transfer assist level: Supervision/Verbal cueing (slide board transfer)     Locomotion Ambulation   Ambulation assist   Ambulation activity did not occur: Safety/medical concerns          Walk 10 feet activity   Assist  Walk 10 feet activity did not occur: Safety/medical concerns        Walk 50 feet activity   Assist Walk 50 feet with 2 turns activity did not occur: Safety/medical concerns         Walk 150 feet activity   Assist Walk 150 feet activity did not occur: Safety/medical concerns         Walk 10 feet on uneven surface  activity   Assist Walk 10 feet on uneven surfaces  activity did not occur: Safety/medical concerns         Wheelchair     Assist Is the patient using a wheelchair?: Yes Type of Wheelchair: Manual Wheelchair activity did not occur: Safety/medical concerns  Wheelchair assist level: Supervision/Verbal cueing Max wheelchair distance: 150+    Wheelchair 50 feet with 2 turns activity    Assist    Wheelchair 50 feet with 2 turns activity did not occur: Safety/medical concerns   Assist Level: Supervision/Verbal cueing   Wheelchair 150 feet activity     Assist  Wheelchair 150 feet activity did not occur: Safety/medical concerns   Assist Level: Supervision/Verbal cueing   Blood pressure 123/71, pulse (!) 42, temperature 97.6 F (36.4 C), resp. rate 18, height 6' 2 (1.88 m), weight 82.8 kg, SpO2 98%.  Medical Problem List and Plan: 1. Functional deficits secondary to R BKA d/t osteomyelitis 11/14/23 by Dr. Harden, hx of charcot joint             -patient may not shower until wound vac removed             -ELOS/Goals: 16-18 days              -f/up with Dr. Harden 1wk after d/c,              -Continue CIR therapies including PT and OT. Interdisciplinary team conference today to discuss goals, barriers to discharge, and dc planning.   -Expected discharge 7/11      2.  Antithrombotics: -DVT/anticoagulation:  Pharmaceutical: Eliquis  5mg  BID             -antiplatelet therapy: n/a 3. Pain Management/DDD/neuropathy: tylenol  and oxycodone  PRN, flexeril  PRN, voltaren  PRN, Lyrica  100mg  BID, Fioricet  PRN  -6/29 Voltaren  for right shoulder pain scheduled  7/1 Xray R shoulder- OA and subluxation humeral head  Consider shoulder cortisone injection    7/9 pt would like to pursue right shoulder injection -will perform in AM 7/10 - continue as above for now  4. Mood/Behavior/Sleep: No concerns             -antipsychotic agents: n/a 5. Neuropsych/cognition: This patient is capable of making decisions on his own behalf.  7/1 patient  was seen by neuropsychology yesterday for coping and adjustment, appreciate assistance 6. Skin/Wound Care: routine skin checks, continue wound vac R BKA, wound care LUE wound -Add Eucerin cream to left foot daily 7/9 bk incision looks great==continue shrinker.    7. Fluids/Electrolytes/Nutrition/hypokalemia/hyponatremia: routine I&O, routine labs, continue vitamins/supplements, continue Kdur 20mEq daily  6/30: K 3.3; potassium chloride  increased from 20 mill equivalents daily to 40 meq daily    7/3  potassium stable at 3.8, recheck Monday  7/9- continues on 40meq daily potassium --recheck tomorrow 8. PAF on Eliquis  and Diltiazem  180mg  daily  - Heart rate controlled 9. HFpEF (EF 40-45% 02/2023): resume home Lasix  40mg  daily, daily weights-ordered Filed Weights   12/01/23 0431 12/02/23 0500 12/03/23 0500  Weight: 87.5 kg 87.5 kg 82.8 kg  -6/25 wt a little lower, monitor tend, decrease lasix  to 20mg  daily, Cr was a little higher at 0.71/BUN 16, recheck friday -6/29 No signs of overload, continue to monitor weight and recheck labs tomorrow 6/30: unreliable; no external signs of volume overload.  Continue current regimen. 7/4 weights appear stable past few days, no signs of overload -11/29/23 wt up but could be bed inconsistency, monitor trend.  -7/9 weight down today--follow up weight/lab in am. May need to hold off on further lasix    10. HTN: Continue Diltiazem  as above, home Imdur  on hold, resume as appropriate    12/03/2023    5:44 AM 12/03/2023    5:00 AM 12/02/2023    8:18 PM  Vitals with BMI  Weight  182 lbs 9 oz   BMI  23.43   Systolic 123  123  Diastolic 71  65  Pulse 42  69  7/9 BP controlled continue to monitor  11. HLD: continue Lipitor 20mg  daily 12. COPD/agent orange exposure: continue chronic Prednisone  20mg  daily, continue Brovana /Pulmicort  nebs, Mucinex  PRN, Duonebs PRN 13. BPH: continue Flomax  0.4mg  nightly 14. GERD: continue Protonix  40mg  BID, Maalox PRN 15.  ?Gout/elevated uric acid: home med Allopurinol  100mg  qAM on hold, restart as appropriate.  16. Anemia, acute on chronic: Hgb 9-10 range during acute stay, monitor labs  6/30: HgB 9.5; stable  7/7 hgb stable at 9.7  17. Vitamin B12 deficiency: 1000mcg SQ weekly ordered during admission,---transition to oral at discharge  18. Constipation: LBM day of admission, continue Colace 100mg  BID and Miralax  daily  - 6/25 LBM today, continue to monitor   -6/26 discontinue Colace and change MiraLAX  to as needed  -7/2 reports loose Bms, consider imodium PRN if continues  7/4 LBM was 7/2, not having further diarrhea.  Continue to monitor bowel function.  Continue as needed MiraLAX   -11/29/23 LBM yesterday, monitor -12/02/23 LBM --continue above plan--   LOS: 17 days A FACE TO FACE EVALUATION WAS PERFORMED  Levi [name] 12/03/2023, 9:05 AM

## 2023-12-04 LAB — CBC
HCT: 31.9 % — ABNORMAL LOW (ref 39.0–52.0)
Hemoglobin: 10 g/dL — ABNORMAL LOW (ref 13.0–17.0)
MCH: 29.8 pg (ref 26.0–34.0)
MCHC: 31.3 g/dL (ref 30.0–36.0)
MCV: 94.9 fL (ref 80.0–100.0)
Platelets: 209 K/uL (ref 150–400)
RBC: 3.36 MIL/uL — ABNORMAL LOW (ref 4.22–5.81)
RDW: 16.9 % — ABNORMAL HIGH (ref 11.5–15.5)
WBC: 8.5 K/uL (ref 4.0–10.5)
nRBC: 0 % (ref 0.0–0.2)

## 2023-12-04 LAB — BASIC METABOLIC PANEL WITH GFR
Anion gap: 9 (ref 5–15)
BUN: 28 mg/dL — ABNORMAL HIGH (ref 8–23)
CO2: 28 mmol/L (ref 22–32)
Calcium: 9.2 mg/dL (ref 8.9–10.3)
Chloride: 104 mmol/L (ref 98–111)
Creatinine, Ser: 0.76 mg/dL (ref 0.61–1.24)
GFR, Estimated: >60 mL/min (ref >60)
Glucose, Bld: 85 mg/dL (ref 70–99)
Potassium: 3.8 mmol/L (ref 3.5–5.1)
Sodium: 141 mmol/L (ref 135–145)

## 2023-12-04 MED ORDER — TRIAMCINOLONE ACETONIDE 40 MG/ML IJ SUSP
40.0000 mg | Freq: Once | INTRAMUSCULAR | Status: AC
  Administered 2023-12-04: 40 mg via INTRA_ARTICULAR
  Filled 2023-12-04: qty 1

## 2023-12-04 MED ORDER — LIDOCAINE-EPINEPHRINE 0.5 %-1:200000 IJ SOLN
5.0000 mL | Freq: Once | INTRAMUSCULAR | Status: AC
  Administered 2023-12-04: 5 mL
  Filled 2023-12-04: qty 50

## 2023-12-04 NOTE — Progress Notes (Signed)
 Patient ID: Levi [name], male   DOB: [birth year], 78 y.o.   MRN: [id number] Have reached out to [employer] regarding pt's right leg rest and not moving freely. Have asked for a switch out.

## 2023-12-04 NOTE — Patient Care Conference (Signed)
 Inpatient RehabilitationTeam Conference and Plan of Care Update Date: 12/03/2023   Time: 12:16 PM    Patient Name: Levi [name]      Medical Record Number: [id number]  Date of Birth: 1945-09-29 Sex: Male         Room/Bed: 4W03C/4W03C-01 Payor Info: Payor: MEDICARE / Plan: MEDICARE PART A AND B / Product Type: *No Product type* /    Admit Date/Time:  11/16/2023  1:15 PM  Primary Diagnosis:  S/P BKA (below knee amputation) unilateral, right [facility])  Hospital Problems: Principal Problem:   S/P BKA (below knee amputation) unilateral, right (HCC) Active Problems:   Primary osteoarthritis, right shoulder   Coping style affecting medical condition    Expected Discharge Date: Expected Discharge Date: 12/05/23  Team Members Present: Physician leading conference: Dr. Prentice Compton Social Worker Present: Rhoda Clement, LCSW Nurse Present: Barnie Ronde, RN PT Present: Doreene Orris, PT OT Present: Other (comment) ANCEL Seip OT) PPS Coordinator present : Eleanor Colon, SLP     Current Status/Progress Goal Weekly Team Focus  Bowel/Bladder      Continent of bowel and bladder          Swallow/Nutrition/ Hydration               ADL's   CGA to sup for transfers, footwear max to dep. ADLs, sup to CG   CG to min A HHOT   transfers, weight shifting, sit to stands, caregiver training.    Mobility   at goal level   supervision/CGA WC level  D/C 7/11, HHPT, family training complete, D helping with car transfer, pt needs WC, hospital bed, slide board    Communication                Safety/Cognition/ Behavioral Observations               Pain      Shoulder pain managed with voltaren  gel and oxy and flexeril  prn    Pain < 4 with prns    Assess pain q shift and effectiveness of prn meds  Skin      Surgical site with shrinker and ampu-shield   Incision healing     Assess skin q shift and note sign/symptoms of non healing incision    Discharge Planning:  Family  education today with wife, have ordered DME and HH arranged. Working toward DC Friday   Team Discussion: Patient post right BKA; progress is limited by poor core stability and chronic RTC issues/pain and, posterior bias, pushing with residual limb.   Patient on target to meet rehab goals: yes, currently on target to meet goals for discharge.  *See Care Plan and progress notes for long and short-term goals.   Revisions to Treatment Plan:  N/a   Teaching Needs: Safety, medications, transfers, toileting, residual limb care, etc.   Current Barriers to Discharge: Decreased caregiver support, Home enviroment access/layout, and Weight bearing restrictions  Possible Resolutions to Barriers: Family education HH follow up services DME: hospital bed, wheelchair, slide-board and [facility]     Medical Summary Current Status: s/p right bka. wound healing nicely. vac off. will see ortho in office for staple removal. right shoulder pain. constipated. monitoring volume with hx of CHF  Barriers to Discharge: Medical stability;Complicated Wound   Possible Resolutions to [employer]: daily assessment of wounds, pain, pt data, vs with adjustment to treatment regimen as needed   Continued Need for Acute Rehabilitation Level of Care: The patient requires daily medical management by a  physician with specialized training in physical medicine and rehabilitation for the following reasons: Direction of a multidisciplinary physical rehabilitation program to maximize functional independence : Yes Medical management of patient stability for increased activity during participation in an intensive rehabilitation regime.: Yes Analysis of laboratory values and/or radiology reports with any subsequent need for medication adjustment and/or medical intervention. : Yes   I attest that I was present, lead the team conference, and concur with the assessment and plan of the team.   Fredericka Sober B 12/04/2023,  11:25 AM

## 2023-12-04 NOTE — Plan of Care (Signed)
  Problem: RH Balance Goal: LTG Patient will maintain dynamic sitting balance (PT) Description: LTG:  Patient will maintain dynamic sitting balance with assistance during mobility activities (PT) Outcome: Adequate for Discharge   Problem: RH Balance Goal: LTG Patient will maintain dynamic standing balance (PT) Description: LTG:  Patient will maintain dynamic standing balance with assistance during mobility activities (PT) Outcome: Completed/Met   Problem: Sit to Stand Goal: LTG:  Patient will perform sit to stand with assistance level (PT) Description: LTG:  Patient will perform sit to stand with assistance level (PT) Outcome: Completed/Met   Problem: RH Wheelchair Mobility Goal: LTG Patient will propel w/c in controlled environment (PT) Description: LTG: Patient will propel wheelchair in controlled environment, # of feet with assist (PT) Outcome: Completed/Met Goal: LTG Patient will propel w/c in home environment (PT) Description: LTG: Patient will propel wheelchair in home environment, # of feet with assistance (PT). Outcome: Completed/Met Goal: LTG Patient will propel w/c in community environment (PT) Description: LTG: Patient will propel wheelchair in community environment, # of feet with assist (PT) Outcome: Completed/Met   Problem: RH Bed Mobility Goal: LTG Patient will perform bed mobility with assist (PT) Description: LTG: Patient will perform bed mobility with assistance, with/without cues (PT). Outcome: Completed/Met   Problem: RH Bed to Chair Transfers Goal: LTG Patient will perform bed/chair transfers w/assist (PT) Description: LTG: Patient will perform bed to chair transfers with assistance (PT). Outcome: Completed/Met

## 2023-12-04 NOTE — Progress Notes (Signed)
 PROGRESS NOTE   Subjective/Complaints:  No new issues. Ready for shoulder injection!  ROS: Patient denies fever, rash, sore throat, blurred vision, dizziness, nausea, vomiting, diarrhea, cough, shortness of breath or chest pain,  headache, or mood change.   Objective:   No results found.  Recent Labs    12/04/23 0615  WBC 8.5  HGB 10.0*  HCT 31.9*  PLT 209     Recent Labs    12/04/23 0615  NA 141  K 3.8  CL 104  CO2 28  GLUCOSE 85  BUN 28*  CREATININE 0.76  CALCIUM  9.2     Intake/Output Summary (Last 24 hours) at 12/04/2023 1036 Last data filed at 12/04/2023 0915 Gross per 24 hour  Intake 1100 ml  Output 1000 ml  Net 100 ml        Physical Exam: Vital Signs Blood pressure 127/63, pulse 67, temperature 97.6 F (36.4 C), resp. rate 17, height 6' 2 (1.88 m), weight 85.2 kg, SpO2 100%.   Constitutional: No distress . Vital signs reviewed. HEENT: NCAT, EOMI, oral membranes moist Neck: supple Cardiovascular: RRR without murmur. No JVD    Respiratory/Chest: CTA Bilaterally without wheezes or rales. Normal effort    GI/Abdomen: BS +, non-tender, non-distended Ext: no clubbing, cyanosis, or edema Psych: pleasant and cooperative  Skin: BKA incision with staples, well approximated. No drainage.    Left foot with dry, flaking skin throughout LLE       Neuro: AAOx4. No apparent cognitive deficits  Sensory exam: Reduced sensation to light touch in distal left lower extremity Motor exam: strength 5/5 throughout bilateral upper extremities, bilateral lower extremities, and with exception of right shoulder 4/5 --pain inhibition  MSK: Limited range of motion of right shoulder Right shoulder remains  limited in flexion, abduction to less than 100 degrees. Most of pain is with IR and severe ABD. +crepitus Prior second toe amputation. Right BK well shaped   Assessment/Plan: 1. Functional deficits which  require 3+ hours per day of interdisciplinary therapy in a comprehensive inpatient rehab setting. Physiatrist is providing close team supervision and 24 hour management of active medical problems listed below. Physiatrist and rehab team continue to assess barriers to discharge/monitor patient progress toward functional and medical goals  Care Tool:  Bathing    Body parts bathed by patient: Right arm, Left arm, Chest, Abdomen, Right upper leg, Left upper leg, Face, Front perineal area   Body parts bathed by helper: Buttocks, Right upper leg Body parts n/a: Right lower leg   Bathing assist Assist Level: Moderate Assistance - Patient 50 - 74%     Upper Body Dressing/Undressing Upper body dressing   What is the patient wearing?: Pull over shirt    Upper body assist Assist Level: Minimal Assistance - Patient > 75%    Lower Body Dressing/Undressing Lower body dressing      What is the patient wearing?: Pants, Incontinence brief     Lower body assist Assist for lower body dressing: Total Assistance - Patient < 25%     Toileting Toileting    Toileting assist Assist for toileting: Minimal Assistance - Patient > 75%     Transfers Chair/bed transfer  Transfers assist  Chair/bed transfer activity did not occur: Safety/medical concerns  Chair/bed transfer assist level: Supervision/Verbal cueing (slide board transfer)     Locomotion Ambulation   Ambulation assist   Ambulation activity did not occur: Safety/medical concerns          Walk 10 feet activity   Assist  Walk 10 feet activity did not occur: Safety/medical concerns        Walk 50 feet activity   Assist Walk 50 feet with 2 turns activity did not occur: Safety/medical concerns         Walk 150 feet activity   Assist Walk 150 feet activity did not occur: Safety/medical concerns         Walk 10 feet on uneven surface  activity   Assist Walk 10 feet on uneven surfaces activity did not  occur: Safety/medical concerns         Wheelchair     Assist Is the patient using a wheelchair?: Yes Type of Wheelchair: Manual Wheelchair activity did not occur: Safety/medical concerns  Wheelchair assist level: Supervision/Verbal cueing Max wheelchair distance: 150+    Wheelchair 50 feet with 2 turns activity    Assist    Wheelchair 50 feet with 2 turns activity did not occur: Safety/medical concerns   Assist Level: Supervision/Verbal cueing   Wheelchair 150 feet activity     Assist  Wheelchair 150 feet activity did not occur: Safety/medical concerns   Assist Level: Supervision/Verbal cueing   Blood pressure 127/63, pulse 67, temperature 97.6 F (36.4 C), resp. rate 17, height 6' 2 (1.88 m), weight 85.2 kg, SpO2 100%.  Medical Problem List and Plan: 1. Functional deficits secondary to R BKA d/t osteomyelitis 11/14/23 by Dr. Harden, hx of charcot joint             -patient may not shower until wound vac removed             -ELOS/Goals: 7/11              -f/up with Dr. Harden 1wk after d/c,              -Continue CIR therapies including PT, OT           2.  Antithrombotics: -DVT/anticoagulation:  Pharmaceutical: Eliquis  5mg  BID             -antiplatelet therapy: n/a 3. Pain Management/DDD/neuropathy: tylenol  and oxycodone  PRN, flexeril  PRN, voltaren  PRN, Lyrica  100mg  BID, Fioricet  PRN  -6/29 Voltaren  for right shoulder pain scheduled  7/1 Xray R shoulder- OA and subluxation humeral head  Consider shoulder cortisone injection    7/10 After informed consent and preparation of the skin with betadine  and isopropyl alcohol, I injected 40mg  of kenalog  and 4cc of 0.5% preservative free lidocaine  into the right subacromial space via lateral approach. Additionally, aspiration was performed prior to injection. The patient tolerated well, and no complications were encountered. Afterward the area was cleaned and dressed. Post- injection instructions were provided.    4.  Mood/Behavior/Sleep: No concerns             -antipsychotic agents: n/a 5. Neuropsych/cognition: This patient is capable of making decisions on his own behalf.  7/1 patient was seen by neuropsychology yesterday for coping and adjustment, appreciate assistance 6. Skin/Wound Care: routine skin checks, continue wound vac R BKA, wound care LUE wound -Add Eucerin cream to left foot daily 7/10 bk incision looks great==continue shrinker--staples out at office.    7. Fluids/Electrolytes/Nutrition/hypokalemia/hyponatremia: routine I&O, routine  labs, continue vitamins/supplements,   7/10 K+ 3.8, continue on 40meq daily potassium while here. Can go home on 20meq daily with addnl K+ thru diet 8. PAF on Eliquis  and Diltiazem  180mg  daily  - Heart rate controlled 9. HFpEF (EF 40-45% 02/2023): resume home Lasix  40mg  daily, daily weights-ordered Filed Weights   12/02/23 0500 12/03/23 0500 12/04/23 0548  Weight: 87.5 kg 82.8 kg 85.2 kg  -6/25 wt a little lower, monitor tend, decrease lasix  to 20mg  daily, Cr was a little higher at 0.71/BUN 16, recheck friday -6/29 No signs of overload, continue to monitor weight and recheck labs tomorrow 6/30: unreliable; no external signs of volume overload.  Continue current regimen. 7/4 weights appear stable past few days, no signs of overload -11/29/23 wt up but could be bed inconsistency, monitor trend.  -7/10 weight up to 85kg, likely variance in weighing technique -same lasix  and K+   10. HTN: Continue Diltiazem  as above, home Imdur  on hold, resume as appropriate    12/04/2023    5:48 AM 12/03/2023    8:23 PM 12/03/2023    7:48 PM  Vitals with BMI  Weight 187 lbs 13 oz    BMI 24.11    Systolic 127  131  Diastolic 63  74  Pulse 67 71 74  7/10 BP controlled continue to monitor  11. HLD: continue Lipitor 20mg  daily 12. COPD/agent orange exposure: continue chronic Prednisone  20mg  daily, continue Brovana /Pulmicort  nebs, Mucinex  PRN, Duonebs PRN 13. BPH: continue  Flomax  0.4mg  nightly 14. GERD: continue Protonix  40mg  BID, Maalox PRN 15. ?Gout/elevated uric acid: home med Allopurinol  100mg  qAM on hold, restart as appropriate.  16. Anemia, acute on chronic: Hgb 9-10 range during acute stay, monitor labs  6/30: HgB 9.5; stable  7/7 hgb stable at 9.7  17. Vitamin B12 deficiency: 1000mcg SQ weekly ordered during admission,---transition to oral at discharge  18. Constipation: LBM day of admission, continue Colace 100mg  BID and Miralax  daily  - 6/25 LBM today, continue to monitor   -6/26 discontinue Colace and change MiraLAX  to as needed  -7/2 reports loose Bms, consider imodium PRN if continues  7/4 LBM was 7/2, not having further diarrhea.  Continue to monitor bowel function.  Continue as needed MiraLAX   -11/29/23 LBM yesterday, monitor -12/02/23 LBM --continue above plan--   LOS: 18 days A FACE TO FACE EVALUATION WAS PERFORMED  Arthea ONEIDA Gunther 12/04/2023, 10:36 AM

## 2023-12-04 NOTE — Progress Notes (Signed)
 Physical Therapy Session Note  Patient Details  Name: Levi [name] MRN: [id number] Date of Birth: July 27, 1945  Today's Date: 12/04/2023 PT Individual Time: 0815-0855 PT Individual Time Calculation (min): 40 min   Short Term Goals: Week 2:  PT Short Term Goal 1 (Week 2): pt caregiver will complete and return home measurement sheet PT Short Term Goal 1 - Progress (Week 2): Met PT Short Term Goal 2 (Week 2): pt will perform sit to stand with LRAD and mod A PT Short Term Goal 2 - Progress (Week 2): Met Week 3:  PT Short Term Goal 1 (Week 3): STG=LTG 2/2 ELOS  Skilled Therapeutic Interventions/Progress Updates:   Received pt sitting EOB, pt agreeable to PT treatment, and reported pain in R shoulder - RN notified and MD arrived to discuss steroid injection later today. Session with emphasis on functional mobility/transfers and generalized strengthening and endurance. Donned L compression sock and shoe with max A and pt performed slideboard transfer downhill bed<>WC with supervision - pt reports getting hospital bed at home.  Pt performed WC mobility 169ft using BUE and supervision to dayroom with emphasis on BUE strength/coordination. Pt performed seated LLE strengthening on Kinetron at 20 cm/sec for 5 minutes with therapist providing manual counter resistance with emphasis on glute/quad strength. Pt then performed the following seated exercises with emphasis on core and LE strength/ROM: -L knee extension with 2.5lb ankle weight 2x15 -L hip flexion with 2.5lb ankle weight 2x15 -lateral trunk rotation with 6lb dumbbell 2x10 bilaterally Returned to room and concluded session with pt sitting in [facility] with all needs within reach awaiting upcoming PT session.     Therapy Documentation Precautions:  Precautions Precautions: Fall Recall of Precautions/Restrictions: Intact Other Brace: RLE ampushield Restrictions Weight Bearing Restrictions Per Provider Order: Yes RLE Weight Bearing Per Provider  Order: Non weight bearing Other Position/Activity Restrictions: no pillow under R knee   Therapy/Group: Individual Therapy Therisa HERO Zaunegger Therisa Stains PT, DPT 12/04/2023, 6:58 AM

## 2023-12-04 NOTE — Progress Notes (Signed)
 Occupational Therapy Discharge Summary  Patient Details  Name: Levi [name] MRN: [id number] Date of Birth: [birth year]  Date of Discharge from OT service:December 05, 2023   Patient has met 6 of 7 long term goals due to improved activity tolerance, improved balance, postural control, improved awareness, and improved coordination.  Patient to discharge at overall CGA/min level.  Patient's care partner is independent to provide the necessary physical assistance at discharge.    Reasons goals not met: 1 goal not met for standing static mod I due to increased weakness in core and LE secondary to deconditioning. Patient unable to progress to mod I in standing tolerance.   Recommendation:  Patient will benefit from ongoing skilled OT services in home health setting to continue to advance functional skills in the area of BADL.  Equipment: Slide board, hospital bed, bsc  Reasons for discharge: treatment goals met and discharge from hospital  Patient/family agrees with progress made and goals achieved: Yes  OT Discharge Precautions/Restrictions  Precautions Precautions: Fall Recall of Precautions/Restrictions: Intact Other Brace: RLE ampushield, R LE shrinker Restrictions Weight Bearing Restrictions Per Provider Order: Yes RLE Weight Bearing Per Provider Order: Non weight bearing General   Vital Signs Therapy Vitals Pulse Rate: (!) 43 BP: 134/68 Pain Pain Assessment Pain Scale: 0-10 Pain Score: 0-No pain ADL ADL Grooming: Modified independent Where Assessed-Grooming: Sitting at sink Upper Body Bathing: Supervision/safety Where Assessed-Upper Body Bathing: Sitting at sink Lower Body Bathing: Contact guard Where Assessed-Lower Body Bathing: Sitting at sink Upper Body Dressing: Modified independent (Device) Where Assessed-Upper Body Dressing: Sitting at sink Lower Body Dressing: Contact guard Where Assessed-Lower Body Dressing: Edge of bed, Bed level Toileting: Minimal  assistance Where Assessed-Toileting: Bedside Commode Toilet Transfer: Furniture conservator/restorer Method: Geographical information systems officer: Administrator, arts Method: Designer, industrial/product: Information systems manager without back Vision Baseline Vision/History: 1 Wears glasses Patient Visual Report: No change from baseline Vision Assessment?: No apparent visual deficits Perception  Perception: Within Functional Limits Praxis Praxis: WFL Cognition Cognition Overall Cognitive Status: History of cognitive impairments - at baseline Arousal/Alertness: Awake/alert Orientation Level: Person;Place;Situation Memory: Impaired Focused Attention: Appears intact Sustained Attention: Appears intact Awareness: Appears intact Problem Solving: Appears intact Safety/Judgment: Impaired Brief Interview for Mental Status (BIMS) Repetition of Three Words (First Attempt): 3 Temporal Orientation: Year: Correct Temporal Orientation: Month: Accurate within 5 days Temporal Orientation: Day: Correct Recall: Sock: Yes, no cue required Recall: Blue: Yes, no cue required Recall: Bed: Yes, no cue required BIMS Summary Score: 15 Sensation Sensation Light Touch: Impaired Detail Light Touch Impaired Details: Impaired RLE;Impaired LLE Additional Comments: B neuropathy, intermittent phantom pain/sensations Coordination Gross Motor Movements are Fluid and Coordinated: No Fine Motor Movements are Fluid and Coordinated: No Coordination and Movement Description: limited 2/2 R BKA and generalized debility Motor  Motor Motor: Abnormal postural alignment and control;Other (comment) Motor - Skilled Clinical Observations: R BKA, generalized debility Mobility  Bed Mobility Bed Mobility: Rolling Right;Rolling Left;Left Sidelying to Sit;Sit to Sidelying Left Rolling Right: Independent with assistive device Rolling Left: Independent with assistive device Left Sidelying to Sit:  Independent with assistive device Sit to Sidelying Left: Independent with assistive device Transfers Sit to Stand: Moderate Assistance - Patient 50-74% Stand to Sit: Moderate Assistance - Patient 50-74%  Trunk/Postural Assessment  Cervical Assessment Cervical Assessment: Within Functional Limits Thoracic Assessment Thoracic Assessment: Exceptions to [facility] (kyphosis) Lumbar Assessment Lumbar Assessment: Exceptions to [facility] (posterior pelvic tilt) Postural Control Postural Control: Deficits on evaluation Trunk Control: posterior  bias Righting Reactions: pt with delayed repsonses however improved since eval  Balance Balance Balance Assessed: Yes Static Sitting Balance Static Sitting - Balance Support: No upper extremity supported Static Sitting - Level of Assistance: 6: Modified independent (Device/Increase time) Dynamic Sitting Balance Dynamic Sitting - Balance Support: During functional activity;Bilateral upper extremity supported;No upper extremity supported Dynamic Sitting - Level of Assistance: 5: Stand by assistance Sitting balance - Comments: can sit up without hands and foot contact Static Standing Balance Static Standing - Balance Support: During functional activity Static Standing - Level of Assistance: 5: Stand by assistance Extremity/Trunk Assessment RUE Assessment RUE Assessment: Within Functional Limits General Strength Comments: shoulder strength limited LUE Assessment LUE Assessment: Within Functional Limits   D'mariea L Erico Stan 12/04/2023, 2:18 PM

## 2023-12-04 NOTE — Progress Notes (Signed)
 Inpatient Rehabilitation Care Coordinator Discharge Note   Patient Details  Name: Levi [name] MRN: [id number] Date of Birth: [birth year]   Discharge location: HOME WITH WIFE WHO IS ABLE TO PROVIDE THE CARE PT REUQIRES 24/7 CARE  Length of Stay: 19 DAYS  Discharge activity level: SUPERVISION TRANSFER'S-MIN STAND-WHEELCHAIR LEVEL  Home/community participation: ACTIVE  Patient response un:Yzjouy Literacy - How often do you need to have someone help you when you read instructions, pamphlets, or other written material from your doctor or pharmacy?: Never  Patient response un:Dnrpjo Isolation - How often do you feel lonely or isolated from those around you?: Rarely  Services provided included: MD, RD, PT, OT, RN, CM, TR, Pharmacy, Neuropsych, SW  Financial Services:  Field seismologist Utilized: [employer] offered to/list presented to: PT AND WIFE  Follow-up services arranged:  Home Health, DME, Patient/Family has no preference for HH/DME agencies Home Health Agency: [facility]  PT  OT  RN    DME : [facility], [place], SLIDE BOARD    Patient response to transportation need: Is the patient able to respond to transportation needs?: Yes In the past 12 months, has lack of transportation kept you from medical appointments or from getting medications?: No In the past 12 months, has lack of transportation kept you from meetings, work, or from getting things needed for daily living?: No   Patient/Family verbalized understanding of follow-up arrangements:  Yes  Individual responsible for coordination of the follow-up plan: ALISON-WIFE [phone]  Confirmed correct DME delivered: Raymonde Asberry MATSU 12/04/2023    Comments (or additional information):WIFE HERE DAILY AND TRAINED IN HUSBAND;S CARE. BOTH FEEL READY TO GO HOME.   Summary of Stay    Date/Time Discharge Planning CSW  12/03/23 [phone] Family education today with wife, have ordered DME and HH  arranged. Working toward DC Friday RGD  11/26/23 1020 Wife is here and participates in pt's care. Aware of his care needs and need to regain strength. Will work on DC needs. RGD  11/19/23 1001 Home with wife who has been here and observed in therapies. Will order DME and follow up once have team's recommendations. Glad to be here on rehab RGD       Annalis Kaczmarczyk G

## 2023-12-04 NOTE — Progress Notes (Signed)
 Physical Therapy Session Note  Patient Details  Name: Levi [name] MRN: [id number] Date of Birth: Sep 02, 1945  Today's Date: 12/04/2023 PT Individual Time: 0900-0927, 1100-1158 PT Individual Time Calculation (min): 27 min, 58 min  Short Term Goals: Week 3:  PT Short Term Goal 1 (Week 3): STG=LTG 2/2 ELOS  Skilled Therapeutic Interventions/Progress Updates:      Today's Interventions  Treatment Session 1   Pt seated in WC upon arrival. Pt agreeable to therapy. Pt denies any pain.   PT performed discharge assessment, see discharge note.   Pt denies any questions/concerns regarding D/C home. Pt reports wife is at house 2/2 hospital bed being delivered this morning.   Began reviewing HEP:   1x10 WC push ups   1x10 glute sets x10 holding 5 seconds  Pt seated in WC at end of session with all needs within reach and seatbelt alarm on.   Treatment Session 2   Pt seated in personal WC upon arrival. Pt agreeable to therapy. Pt reports 5/10 R shoulder pain, pt reports he received the injection a few minutes ago.   Of note R LE elevating leg rest not lifting to appropriate height, notified social worker/[employer]. [employer] rep present at end of session to deliver new R LE leg rests, therapist adjusted to appropriate height and positioning to facilitate R knee extension.   Pt performed slide board transfer WC to bed with supervision with therapist placing removing board, max verbal cues provided for anterior weight shift and head hip ratio.    Access Code: [email or web address] URL: [email or web address] Date: 12/04/2023 Prepared by: Doreene Orris  Reviewed and performed the following HEP, education provided for proper technique   Exercises - Prone Hip Extension with Residual Limb (BKA)  - 1 x daily - 7 x weekly - 3 sets - 10 reps - Sidelying Hip Abduction (BKA)  - 1 x daily - 7 x weekly - 3 sets - 10 reps - Supine Ankle Inversion Eversion AROM  - 1 x daily - 7 x weekly - 3 sets - 10  reps  - Seated Heel Toe Raises  - 1 x daily - 7 x weekly - 3 sets - 10 reps - Supine Gluteal Sets  - 1 x daily - 7 x weekly - 3 sets - 10 reps - Wheelchair Push-Up (AKA)  - 1 x daily - 7 x weekly - 3 sets - 10 reps - Long Sitting Calf Stretch with Strap  - 1 x daily - 7 x weekly - 3 sets - 10 reps - Supine Active Straight Leg Raise  - 1 x daily - 7 x weekly - 3 sets - 10 reps - Clamshell  - 1 x daily - 7 x weekly - 3 sets - 10 reps  Pt seated in WC at end of session with all needs within reach and seatbelt alarm on.   Therapy Documentation Precautions:  Precautions Precautions: Fall Recall of Precautions/Restrictions: Intact Required Braces or Orthoses: Other Brace Other Brace: RLE ampushield, R LE shrinker Restrictions Weight Bearing Restrictions Per Provider Order: Yes RLE Weight Bearing Per Provider Order: Non weight bearing Other Position/Activity Restrictions: no pillow under R knee     Therapy/Group: Individual Therapy  [facility] [place], [place], DPT  12/04/2023, 12:21 PM

## 2023-12-04 NOTE — Progress Notes (Signed)
 Physical Therapy Discharge Summary  Patient Details  Name: Levi [name] MRN: [id number] Date of Birth: [birth year]  Date of Discharge from PT service:December 04, 2023   Patient has met 8 of 9 long term goals due to improved activity tolerance, improved balance, improved postural control, increased strength, increased range of motion, decreased pain, ability to compensate for deficits, improved attention, improved awareness, and improved coordination.  Patient to discharge at a wheelchair level supervision/CGA.   Patient's care partner is independent to provide the necessary physical and cognitive assistance at discharge.  Reasons goals not met: Pt requires supervision for dynamic seated balance for safety 2/2 posterior bias and poor safety awareness.   Recommendation:  Patient will benefit from ongoing skilled PT services in home health setting to continue to advance safe functional mobility, address ongoing impairments in strength, endurance, coordination, postural control, standing balance, and minimize fall risk.  Equipment: WC, hospital bed, slide board, BSC  Reasons for discharge: treatment goals met and discharge from hospital  Patient/family agrees with progress made and goals achieved: Yes  PT Discharge Precautions/Restrictions Precautions Precautions: Fall Recall of Precautions/Restrictions: Intact Required Braces or Orthoses: Other Brace Other Brace: RLE ampushield, R LE shrinker Restrictions Weight Bearing Restrictions Per Provider Order: Yes RLE Weight Bearing Per Provider Order: Non weight bearing Other Position/Activity Restrictions: no pillow under R knee Pain Interference Pain Interference Pain Effect on Sleep: 1. Rarely or not at all Pain Interference with Therapy Activities: 2. Occasionally (R shoulder) Pain Interference with Day-to-Day Activities: 1. Rarely or not at all Vision/Perception  Vision - History Ability to See in Adequate Light: 0  Adequate Perception Perception: Within Functional Limits Praxis Praxis: WFL  Cognition Overall Cognitive Status: History of cognitive impairments - at baseline (per pt wife) Arousal/Alertness: Awake/alert Orientation Level: Oriented X4 Year: 2025 Month: July Day of Week: Correct Focused Attention: Appears intact Sustained Attention: Appears intact Memory: Impaired Awareness: Appears intact Problem Solving: Appears intact Safety/Judgment: Impaired Sensation Sensation Light Touch: Impaired Detail Light Touch Impaired Details: Impaired RLE;Impaired LLE Additional Comments: B neuropathy, intermittent phantom pain/sensations Coordination Gross Motor Movements are Fluid and Coordinated: No Fine Motor Movements are Fluid and Coordinated: Yes Coordination and Movement Description: limited 2/2 R BKA and generalized debility Motor  Motor Motor: Abnormal postural alignment and control;Other (comment) Motor - Skilled Clinical Observations: R BKA, generalized debility  Mobility Bed Mobility Bed Mobility: Rolling Right;Rolling Left;Left Sidelying to Sit;Sit to Sidelying Left Rolling Right: Independent with assistive device Rolling Left: Independent with assistive device Left Sidelying to Sit: Independent with assistive device Sit to Sidelying Left: Independent with assistive device Transfers Transfers: Sit to Stand;Stand to Sit;Squat Pivot Transfers;Lateral/Scoot Transfers;Stand Pivot Transfers Sit to Stand: Moderate Assistance - Patient 50-74%;Minimal Assistance - Patient > 75% (min-mod A for elevated hospital bed with RW, pt requires +2 A for standing from [facility]) Stand to Sit: Moderate Assistance - Patient 50-74%;Minimal Assistance - Patient > 75% Stand Pivot Transfers: 2 Helpers (elevated hospital bed ot WC with +2 A and RW) Squat Pivot Transfers: Moderate Assistance - Patient 50-74% Lateral/Scoot Transfers: Supervision/Verbal cueing (slide board transfer) Transfer (Assistive device):  Other (Comment) (sldie board) Locomotion  Gait Ambulation: No Stairs / Additional Locomotion Stairs: No Ramp: Supervision/Verbal cueing (with use of WC) Wheelchair Mobility Wheelchair Mobility: Yes Wheelchair Assistance: Independent with assistive device Wheelchair Propulsion: Both upper extremities;Left lower extremity Wheelchair Parts Management: Needs assistance  Trunk/Postural Assessment  Cervical Assessment Cervical Assessment: Within Functional Limits Thoracic Assessment Thoracic Assessment: Exceptions to [facility] (rounded shoulders) Lumbar Assessment  Lumbar Assessment: Exceptions to [facility] (posterior pelvic tilt) Postural Control Postural Control: Deficits on evaluation Trunk Control: posterior bias Righting Reactions: pt with delayed repsonses however improved since eval  Balance Balance Balance Assessed: Yes Static Sitting Balance Static Sitting - Balance Support: No upper extremity supported Static Sitting - Level of Assistance: 6: Modified independent (Device/Increase time) Dynamic Sitting Balance Dynamic Sitting - Balance Support: During functional activity;Bilateral upper extremity supported;No upper extremity supported Dynamic Sitting - Level of Assistance: 5: Stand by assistance (supervision) Static Standing Balance Static Standing - Balance Support: During functional activity Static Standing - Level of Assistance: 5: Stand by assistance (CGA with RW once standing) Extremity Assessment  RLE Assessment RLE Assessment: Exceptions to [facility] Active Range of Motion (AROM) Comments: can move in all directions against gravity RLE AROM (degrees) Overall AROM Right Lower Extremity: Within functional limits for tasks assessed LLE Assessment LLE Assessment: Exceptions to [facility] Passive Range of Motion (PROM) Comments: restriction of L knee flexion approx 0-100 deg, with L knee genu valgus and L ankle inversion General Strength Comments: grossly 3+       Doreene Consuela Doreene Consuela, PT, DPT  12/04/2023, 9:14 AM

## 2023-12-04 NOTE — Progress Notes (Signed)
 Physical Therapy Session Note  Patient Details  Name: Levi [name] MRN: [id number] Date of Birth: 05/22/1946  Today's Date: 12/04/2023 PT Individual Time: 1000-1030 PT Individual Time Calculation (min): 30 min  and Today's Date: 12/04/2023 PT Missed Time: 15 Minutes Missed Time Reason: Other (Comment) (breathing treatment)  Short Term Goals: Week 2:  PT Short Term Goal 1 (Week 2): pt caregiver will complete and return home measurement sheet PT Short Term Goal 1 - Progress (Week 2): Met PT Short Term Goal 2 (Week 2): pt will perform sit to stand with LRAD and mod A PT Short Term Goal 2 - Progress (Week 2): Met  Skilled Therapeutic Interventions/Progress Updates:    Pt seated in w/c on arrival and agreeable to therapy, however missed x 15 min for breathing treatment with respiratory at start of session. Session focused on setting up pt's personal w/c. Pt performed CGA-supervision slideboard transfer x 2 with assist to place board. Pt propelled w/c throughout session for endurance and functional mobility. Therapist adjusted leg rests and replaced brake extenders with padding for d/t extenders in the way for slideboard transfer Pt remained in w/c at end of session and was left with all needs in reach and alarm active.   Therapy Documentation Precautions:  Precautions Precautions: Fall Recall of Precautions/Restrictions: Intact Required Braces or Orthoses: Other Brace Other Brace: RLE ampushield, R LE shrinker Restrictions Weight Bearing Restrictions Per Provider Order: Yes RLE Weight Bearing Per Provider Order: Non weight bearing Other Position/Activity Restrictions: no pillow under R knee General: PT Amount of Missed Time (min): 15 Minutes PT Missed Treatment Reason: Other (Comment) (breathing treatment)     Therapy/Group: Individual Therapy  Levi [name] 12/04/2023, 4:33 PM

## 2023-12-04 NOTE — Progress Notes (Addendum)
 Inpatient Rehabilitation Discharge Medication Review by a Pharmacist  A complete drug regimen review was completed for this patient to identify any potential clinically significant medication issues.  High Risk Drug Classes Is patient taking? Indication by Medication  Antipsychotic No   Anticoagulant Yes Apixaban  - Atrial fibrillation  Antibiotic No   Opioid Yes Oxycodone  PRN - pain  Antiplatelet No   Hypoglycemics/insulin No   Vasoactive Medication Yes Diltiazem  CD - Atrial fibrillation Lasix  - Heart failure Tamsulosin  (BPH)  Chemotherapy No   Other Yes atorvastatin  - cholesterol Protonix  - GERD Potassium - supplementation Prednisone /Striverdi - persistent asthma Pregabalin  100 BID - pain Iron /MVI  - supplementation/anemia Zofran  PRN - nausea Tylenol  - pain Eucerin cream - skin protectant Melatonin - sleep Albuterol /duonebs - short of breath Flexeril  - spasms Meclizine  - dizziness Voltaren  gel - pain     Type of Medication Issue Identified Description of Issue Recommendation(s)  Drug Interaction(s) (clinically significant)     Duplicate Therapy     Allergy      No Medication Administration End Date     Incorrect Dose     Additional Drug Therapy Needed  Home Allopurinol  100 on hold Resume when clinically appropriate to reduce risk of gout flare.  Significant med changes from prior encounter (inform family/care partners about these prior to discharge).    Other       Clinically significant medication issues were identified that warrant physician communication and completion of prescribed/recommended actions by midnight of the next day:  No  Name of provider notified for urgent issues identified:   Provider Method of Notification: None  Pharmacist comments:  Time spent performing this drug regimen review (minutes):  20   Sergio Batch, PharmD, [place], AAHIVP, CPP Infectious Disease Pharmacist 12/04/2023 8:49 AM

## 2023-12-04 NOTE — Progress Notes (Signed)
 Occupational Therapy Session Note  Patient Details  Name: Levi [name] MRN: [id number] Date of Birth: March 29, 1946  Today's Date: 12/04/2023 OT Individual Time: 1300-1415 OT Individual Time Calculation (min): 75 min    Short Term Goals: Week 3:  OT Short Term Goal 1 (Week 3): STG=LTG d/t ELOS  Skilled Therapeutic Interventions/Progress Updates:   Patient agreeable to participate in OT session. Reports no pain level.   Patient participated in skilled OT session focusing on functional mobility, transfers, home safety, functional activity tolerance, dynamic sitting balance, core strength. Patient able to complete dynamic sitting balance with BITS. Patient able to complete sequencing with reaching with fair to good accuracy. Patient able to complete sit to stands from elevated surface with mod A, slide board with spouse. Discussed d/c planning. Returned to room, alarm on all needs in reach.   Therapy Documentation Precautions:  Precautions Precautions: Fall Recall of Precautions/Restrictions: Intact Required Braces or Orthoses: Other Brace Other Brace: RLE ampushield, R LE shrinker Restrictions Weight Bearing Restrictions Per Provider Order: Yes RLE Weight Bearing Per Provider Order: Non weight bearing Other Position/Activity Restrictions: no pillow under R knee   Therapy/Group: Individual Therapy  D'mariea L Afshin Chrystal 12/04/2023, 1:40 PM

## 2023-12-05 ENCOUNTER — Other Ambulatory Visit (HOSPITAL_COMMUNITY): Payer: Self-pay

## 2023-12-05 DIAGNOSIS — I739 Peripheral vascular disease, unspecified: Secondary | ICD-10-CM

## 2023-12-05 MED ORDER — MELATONIN 5 MG PO TABS: 5.0000 mg | ORAL_TABLET | Freq: Every evening | ORAL | Status: DC | PRN

## 2023-12-05 MED ORDER — ADULT MULTIVITAMIN W/MINERALS CH: 1.0000 | ORAL_TABLET | Freq: Every day | ORAL | Status: DC

## 2023-12-05 MED ORDER — OXYCODONE HCL 5 MG PO TABS: 5.0000 mg | ORAL_TABLET | Freq: Four times a day (QID) | ORAL | Status: DC | PRN

## 2023-12-05 MED ORDER — OXYCODONE HCL 5 MG PO TABS
5.0000 mg | ORAL_TABLET | Freq: Four times a day (QID) | ORAL | 0 refills | Status: DC | PRN
  Filled 2023-12-05: qty 30, 4d supply, fill #0

## 2023-12-05 MED ORDER — POTASSIUM CHLORIDE CRYS ER 20 MEQ PO TBCR
40.0000 meq | EXTENDED_RELEASE_TABLET | Freq: Every day | ORAL | 0 refills | Status: DC
  Filled 2023-12-05: qty 60, 30d supply, fill #0

## 2023-12-05 MED ORDER — ACETAMINOPHEN 325 MG PO TABS: 325.0000 mg | ORAL_TABLET | Freq: Four times a day (QID) | ORAL | Status: DC | PRN

## 2023-12-05 MED ORDER — HYDROCERIN EX CREA
1.0000 | TOPICAL_CREAM | Freq: Every day | CUTANEOUS | 0 refills | Status: DC
  Filled 2023-12-05: qty 454, 113d supply, fill #0

## 2023-12-05 MED ORDER — APIXABAN 5 MG PO TABS
5.0000 mg | ORAL_TABLET | Freq: Two times a day (BID) | ORAL | 0 refills | Status: DC
  Filled 2023-12-05: qty 60, 30d supply, fill #0

## 2023-12-05 MED ORDER — DICLOFENAC SODIUM 1 % EX GEL
4.0000 g | Freq: Four times a day (QID) | CUTANEOUS | Status: DC
Start: 2023-12-05 — End: 2023-12-18

## 2023-12-05 MED ORDER — FERROUS GLUCONATE 324 (38 FE) MG PO TABS
324.0000 mg | ORAL_TABLET | Freq: Every day | ORAL | 0 refills | Status: DC
  Filled 2023-12-05: qty 30, 30d supply, fill #0

## 2023-12-05 MED ORDER — PANTOPRAZOLE SODIUM 40 MG PO TBEC
40.0000 mg | DELAYED_RELEASE_TABLET | Freq: Two times a day (BID) | ORAL | 0 refills | Status: AC
Start: 2023-12-05 — End: ?
  Filled 2023-12-05: qty 60, 30d supply, fill #0

## 2023-12-05 MED ORDER — PREGABALIN 100 MG PO CAPS
100.0000 mg | ORAL_CAPSULE | Freq: Two times a day (BID) | ORAL | 0 refills | Status: DC
  Filled 2023-12-05: qty 60, 30d supply, fill #0

## 2023-12-05 MED ORDER — FUROSEMIDE 40 MG PO TABS: 20.0000 mg | ORAL_TABLET | Freq: Every day | ORAL | Status: DC

## 2023-12-05 MED ORDER — TAMSULOSIN HCL 0.4 MG PO CAPS
0.4000 mg | ORAL_CAPSULE | Freq: Every day | ORAL | 0 refills | Status: AC
Start: 2023-12-05 — End: ?
  Filled 2023-12-05: qty 30, 30d supply, fill #0

## 2023-12-05 NOTE — Progress Notes (Signed)
 PROGRESS NOTE   Subjective/Complaints:  Pt doing well. Right shoulder less tender and he's able to move it a bit better. Anxious to get home today!  ROS: Patient denies fever, rash, sore throat, blurred vision, dizziness, nausea, vomiting, diarrhea, cough, shortness of breath or chest pain,  headache, or mood change.   Objective:   No results found.  Recent Labs    12/04/23 0615  WBC 8.5  HGB 10.0*  HCT 31.9*  PLT 209     Recent Labs    12/04/23 0615  NA 141  K 3.8  CL 104  CO2 28  GLUCOSE 85  BUN 28*  CREATININE 0.76  CALCIUM  9.2     Intake/Output Summary (Last 24 hours) at 12/05/2023 0955 Last data filed at 12/05/2023 0530 Gross per 24 hour  Intake 580 ml  Output 600 ml  Net -20 ml        Physical Exam: Vital Signs Blood pressure 106/75, pulse 85, temperature 97.6 F (36.4 C), resp. rate 17, height 6' 2 (1.88 m), weight 85.9 kg, SpO2 99%.   Constitutional: No distress . Vital signs reviewed. HEENT: NCAT, EOMI, oral membranes moist Neck: supple Cardiovascular: RRR without murmur. No JVD    Respiratory/Chest: CTA Bilaterally without wheezes or rales. Normal effort    GI/Abdomen: BS +, non-tender, non-distended Ext: no clubbing, cyanosis, or edema Psych: pleasant and cooperative   Skin: BKA incision with staples, well approximated. No drainage.  Left foot with dry       Neuro: AAOx4. No apparent cognitive deficits  Sensory exam: Reduced sensation to light touch in distal left lower extremity Motor exam: strength 5/5 throughout bilateral upper extremities, bilateral lower extremities, and with exception of right shoulder 4/5 --pain inhibition  MSK: Limited range of motion of right shoulder Right shoulder with improved PROM today.  Prior second toe amputation. Right BK well shaped   Assessment/Plan: 1. Functional deficits which require 3+ hours per day of interdisciplinary therapy in a  comprehensive inpatient rehab setting. Physiatrist is providing close team supervision and 24 hour management of active medical problems listed below. Physiatrist and rehab team continue to assess barriers to discharge/monitor patient progress toward functional and medical goals  Care Tool:  Bathing    Body parts bathed by patient: Right arm, Left arm, Chest, Abdomen, Right upper leg, Left upper leg, Face, Front perineal area, Right lower leg, Left lower leg, Buttocks   Body parts bathed by helper: Buttocks, Right upper leg Body parts n/a: Right lower leg   Bathing assist Assist Level: Contact Guard/Touching assist     Upper Body Dressing/Undressing Upper body dressing   What is the patient wearing?: Pull over shirt    Upper body assist Assist Level: Independent with assistive device    Lower Body Dressing/Undressing Lower body dressing      What is the patient wearing?: Pants, Underwear/pull up     Lower body assist Assist for lower body dressing: Contact Guard/Touching assist     Toileting Toileting    Toileting assist Assist for toileting: Minimal Assistance - Patient > 75%     Transfers Chair/bed transfer  Transfers assist  Chair/bed transfer activity did not occur:  Safety/medical concerns  Chair/bed transfer assist level: Supervision/Verbal cueing     Locomotion Ambulation   Ambulation assist   Ambulation activity did not occur: Safety/medical concerns          Walk 10 feet activity   Assist  Walk 10 feet activity did not occur: Safety/medical concerns        Walk 50 feet activity   Assist Walk 50 feet with 2 turns activity did not occur: Safety/medical concerns         Walk 150 feet activity   Assist Walk 150 feet activity did not occur: Safety/medical concerns         Walk 10 feet on uneven surface  activity   Assist Walk 10 feet on uneven surfaces activity did not occur: Safety/medical concerns          Wheelchair     Assist Is the patient using a wheelchair?: Yes Type of Wheelchair: Manual Wheelchair activity did not occur: Safety/medical concerns  Wheelchair assist level: Supervision/Verbal cueing Max wheelchair distance: 150+    Wheelchair 50 feet with 2 turns activity    Assist    Wheelchair 50 feet with 2 turns activity did not occur: Safety/medical concerns   Assist Level: Supervision/Verbal cueing   Wheelchair 150 feet activity     Assist  Wheelchair 150 feet activity did not occur: Safety/medical concerns   Assist Level: Supervision/Verbal cueing   Blood pressure 106/75, pulse 85, temperature 97.6 F (36.4 C), resp. rate 17, height 6' 2 (1.88 m), weight 85.9 kg, SpO2 99%.  Medical Problem List and Plan: 1. Functional deficits secondary to R BKA d/t osteomyelitis 11/14/23 by Dr. Harden, hx of charcot joint             -patient may not shower until wound vac removed             -DC home today             -f/up with Dr. Harden 1wk after d/c,              -f/u with [facility] over next few weeks as well.     -discussed process for prosthesis over the coming weeks ahead      2.  Antithrombotics: -DVT/anticoagulation:  Pharmaceutical: Eliquis  5mg  BID             -antiplatelet therapy: n/a 3. Pain Management/DDD/neuropathy: tylenol  and oxycodone  PRN, flexeril  PRN, voltaren  PRN, Lyrica  100mg  BID, Fioricet  PRN  -6/29 Voltaren  for right shoulder pain scheduled  7/1 Xray R shoulder- OA and subluxation humeral head  Consider shoulder cortisone injection    7/11 injected right shoulder with kenalog  yesterday. Pt seems to be having positive results so far. Continue ROM and sensible activities. SABRA    4. Mood/Behavior/Sleep: No concerns             -antipsychotic agents: n/a 5. Neuropsych/cognition: This patient is capable of making decisions on his own behalf.  7/1 patient was seen by neuropsychology yesterday for coping and adjustment, appreciate assistance 6.  Skin/Wound Care: routine skin checks, continue wound vac R BKA, wound care LUE wound -Added Eucerin cream to left foot daily 7/10 bk incision looks great--continue shrinker--staples out at office.    7. Fluids/Electrolytes/Nutrition/hypokalemia/hyponatremia: routine I&O, routine labs, continue vitamins/supplements,   7/10 K+ 3.8, continue on 40meq daily potassium while here.  7/11Can go home on 20meq daily with addnl K+ thru diet 8. PAF on Eliquis  and Diltiazem  180mg  daily  - Heart rate controlled 9.  HFpEF (EF 40-45% 02/2023): resume home Lasix  40mg  daily, daily weights-ordered Filed Weights   12/03/23 0500 12/04/23 0548 12/05/23 0500  Weight: 82.8 kg 85.2 kg 85.9 kg  -6/25 wt a little lower, monitor tend, decrease lasix  to 20mg  daily, Cr was a little higher at 0.71/BUN 16, recheck friday -6/29 No signs of overload, continue to monitor weight and recheck labs tomorrow 6/30: unreliable; no external signs of volume overload.  Continue current regimen. 7/4 weights appear stable past few days, no signs of overload -11/29/23 wt up but could be bed inconsistency, monitor trend.  -7/11 weight still 85kg, likely variance in weighing technique -continue same lasix  and K+  -weighs down from admit -f/u with primary  10. HTN: Continue Diltiazem  as above, home Imdur  on hold, resume as appropriate    12/05/2023    9:07 AM 12/05/2023    8:44 AM 12/05/2023    5:29 AM  Vitals with BMI  Systolic 106  135  Diastolic 75  68  Pulse  85 70  7/10 BP controlled continue to monitor  11. HLD: continue Lipitor 20mg  daily 12. COPD/agent orange exposure: continue chronic Prednisone  20mg  daily, continue Brovana /Pulmicort  nebs, Mucinex  PRN, Duonebs PRN 13. BPH: continue Flomax  0.4mg  nightly 14. GERD: continue Protonix  40mg  BID, Maalox PRN 15. ?Gout/elevated uric acid: home med Allopurinol  100mg  qAM on hold, restart as appropriate.  16. Anemia, acute on chronic: Hgb 9-10 range during acute stay, monitor  labs  6/30: HgB 9.5; stable  7/7 hgb stable at 9.7  17. Vitamin B12 deficiency: 1000mcg SQ weekly ordered during admission,---transition to oral at discharge  18. Constipation: LBM day of admission, continue Colace 100mg  BID and Miralax  daily  - 6/25 LBM today, continue to monitor   -6/26 discontinue Colace and change MiraLAX  to as needed  -7/2 reports loose Bms, consider imodium PRN if continues  7/4 LBM was 7/2, not having further diarrhea.  Continue to monitor bowel function.  Continue as needed MiraLAX   -11/29/23 LBM yesterday, monitor -12/04/23 LBM --continue above plan--   LOS: 19 days A FACE TO FACE EVALUATION WAS PERFORMED  Levi [name] Gunther 12/05/2023, 9:55 AM

## 2023-12-05 NOTE — Discharge Summary (Signed)
 Physician Discharge Summary  Patient ID: Levi [name] MRN: [id number] DOB/AGE: 77/11/1945 76 y.o.  Admit date: 11/16/2023 Discharge date: 12/05/2023  Discharge Diagnoses:  Principal Problem:   S/P BKA (below knee amputation) unilateral, right (HCC) Active Problems:   Primary osteoarthritis, right shoulder   Coping style affecting medical condition   Discharged Condition: {condition:18240}  Significant Diagnostic Studies: DG Shoulder Right Result Date: 11/25/2023 CLINICAL DATA:  Right shoulder pain. EXAM: RIGHT SHOULDER - 2+ VIEW COMPARISON:  07/05/2022 FINDINGS: No acute fracture. There is superior subluxation of the humeral head abutting the undersurface of the acromion. Mild to moderate acromioclavicular degenerative spurring. Mild glenohumeral degenerative spurring. No fractures scratch at no erosive change or focal bone abnormality. No soft tissue calcifications. IMPRESSION: 1. Superior subluxation of the humeral head abutting the undersurface of the acromion, suggestive of rotator cuff arthropathy. 2. Mild to moderate acromioclavicular and mild glenohumeral osteoarthritis. Electronically Signed   By: Andrea Gasman M.D.   On: 11/25/2023 16:24   CT TIBIA FIBULA RIGHT W CONTRAST Result Date: 11/11/2023 CLINICAL DATA:  Right lower leg cellulitis. EXAM: CT OF THE LOWER RIGHT EXTREMITY WITH CONTRAST TECHNIQUE: Multidetector CT imaging of the right lower leg was performed according to the standard protocol following intravenous contrast administration. RADIATION DOSE REDUCTION: This exam was performed according to the departmental dose-optimization program which includes automated exposure control, adjustment of the mA and/or kV according to patient size and/or use of iterative reconstruction technique. CONTRAST:  75mL OMNIPAQUE  IOHEXOL  350 MG/ML SOLN COMPARISON:  None Available. FINDINGS: Bones/Joint/Cartilage Status post right total knee arthroplasty. No evidence of hardware loosening, acute  fracture or dislocation. No evidence of osteomyelitis. Small knee joint effusion with mild synovial irregularity and small intra-articular calcifications. No significant findings at the ankle. There are mild midfoot degenerative changes. Ligaments Suboptimally assessed by CT. Muscles and Tendons The visualized quadriceps and patellar tendons appear intact. The ankle tendons appear intact, although their distal insertions are incompletely visualized. There is some degenerative calcification within the tibialis anterior tendon. No focal muscular atrophy or abnormal enhancement identified. Soft tissues Generalized subcutaneous edema throughout the lower leg, greatest distally. No focal fluid collection, unexpected foreign body, soft tissue emphysema or suspicious enhancement identified. There are extensive vascular calcifications without evidence of acute vascular thrombosis. IMPRESSION: 1. Generalized subcutaneous edema throughout the lower leg, greatest distally, consistent with cellulitis. No focal fluid collection, soft tissue emphysema or suspicious enhancement identified. 2. No acute osseous findings. Status post right total knee arthroplasty without evidence of hardware loosening or osteomyelitis. 3. Small knee joint effusion with mild synovial irregularity and small intra-articular calcifications. Electronically Signed   By: Elsie Perone M.D.   On: 11/11/2023 08:22   MR FOOT RIGHT W WO CONTRAST Result Date: 11/08/2023 CLINICAL DATA:  Right foot infection. Clinical concern for acute osteomyelitis. History of Charcot arthropathy and osteomyelitis. EXAM: MRI OF THE RIGHT FOREFOOT WITHOUT AND WITH CONTRAST TECHNIQUE: Multiplanar, multisequence MR imaging of the right foot was performed before and after the administration of intravenous contrast. CONTRAST:  10mL GADAVIST  GADOBUTROL  1 MMOL/ML IV SOLN COMPARISON:  right foot radiographs 11/08/2023 and 10/22/2023, CT right foot 10/08/2023 FINDINGS: Soft  tissue/bones/Joint/Cartilage There is new high-grade ulceration within the lateral midfoot, plantar to the cuboid in a region measuring up to approximately 10 mm in transverse dimension and 10 mm in the long axis of the foot (axial series 6, image 33 and sagittal series 9, image 23). There is a sinus tract measuring up to 9 mm in long axis  of the foot and 16 mm in transverse dimension (post-contrast axial series 11, images 32 and 33, postcontrast sagittal series 12, image 23; axial series 6, image 33 and coronal series 7 images 7 through 9). This extends superiorly to the distal lateral aspect of the cuboid. Additional scattered low signal with blooming artifact that may represent subcutaneous air and a tiny wound measuring up to approximately 6 mm just lateral to the base of the fifth metatarsal (axial series 4, image 32 and coronal series 7, image 12) suspicious for a sinus tract extending to the base of the fifth metatarsal. There is high-grade marrow edema throughout the plantar and lateral aspects of the cuboid and throughout the entire adjacent proximal 2.9 cm of the fifth metatarsal. Mild to moderate plantar cortical erosion (axial series 4, image 33) of the cuboid and base of the fifth metatarsal. Findings are indicative of osteomyelitis of the lateral plantar cuboid and the base of the fifth metatarsal. Thin linear fluid bright signal at the lateral aspect of the fifth metatarsal proximal metaphysis may represent an acute nondisplaced fracture (coronal series 7, image 12), possibly pathologic from weakened bone underlying osteomyelitis. There is again postsurgical change of amputation of the fifth toe phalanges. There is metallic susceptibility artifact in between the fifth and fourth metatarsal heads, corresponding to a focal density on prior CT and radiographs that may represent postsurgical metal. Postsurgical changes again noted of prior resection of the second through fourth proximal phalangeal heads,  better seen on prior CT. There are again advanced Charcot arthropathy changes of the midfoot with second through fifth metatarsal mild homolateral subluxation. There is again plantar migration of the midfoot, especially the cuboid, with rocker bottom deformity. Marrow edema within the distal aspect of the lateral cuneiform with high-grade marrow edema within the proximal 15 mm of the fourth metatarsal (coronal series 7 images 13 through 15) are suspicious for osteomyelitis on both sides of the fourth tarsometatarsal joint (also coronal series 7, image 17 and sagittal series 9, image 18). Moderate marrow edema at the first and second tarsometatarsal joints is nonspecific and may represent Charcot arthropathy degenerative changes although acute osteomyelitis is difficult to entirely exclude. Ligaments Limited evaluation given large field-of-view and decreased resolution. There appears to be disruption of the Lisfranc ligament complex, consistent with the lateral subluxation of the second through fifth metatarsals. Muscles and Tendons Moderate focal fluid within the flexor hallucis longus tendon sheath within the plantar midfoot just proximal to the intersection with the flexor digitorum longus tendon at the knot of Henry (axial series 6, image 28). Moderate edema throughout the plantar midfoot to forefoot intrinsic musculature. Soft tissues Moderate diffuse subcutaneous fat edema and swelling about the ankle and midfoot. IMPRESSION: 1. New high-grade ulceration within the lateral midfoot, plantar to the cuboid, with a sinus tract extending superiorly to the distal lateral aspect of the cuboid. Additional tiny wound just lateral to the base of the fifth metatarsal with a sinus tract extending to the base of the fifth metatarsal. 2. Osteomyelitis of the plantar lateral cuboid and base of the fifth metatarsal. Additional marrow edema within the distal aspect of the lateral cuneiform and proximal 15 mm of the fourth  metatarsal are suspicious for osteomyelitis on both sides of the fourth tarsometatarsal joint. 3. Thin linear fluid bright signal at the lateral aspect of the fifth metatarsal proximal metaphysis may represent an acute nondisplaced fracture, possibly pathologic from the weakened bone from the underlying osteomyelitis. 4. Advanced Charcot arthropathy changes of the  midfoot with second through fifth metatarsal homolateral subluxation and plantar migration of the midfoot, especially the cuboid, with rocker-bottom deformity. 5. Moderate marrow edema at the first and second tarsometatarsal joints is nonspecific and may represent Charcot arthropathy degenerative changes, although acute osteomyelitis is difficult to entirely exclude. Electronically Signed   By: Tanda Lyons M.D.   On: 11/08/2023 17:04   DG Foot Complete Right Result Date: 11/08/2023 CLINICAL DATA:  A right foot infection. EXAM: RIGHT FOOT COMPLETE - 3+ VIEW COMPARISON:  Right foot series 10/22/2023 FINDINGS: Three views. There is diffuse moderate edema. Skin staples previously seen overlying the lateral midfoot have been removed in the interval and resolution of patchy soft tissue gas in the area is noted. There are heavy vascular calcifications. Osteopenia without evidence of fractures with old fifth toe amputation. Remote resection of the second through fourth proximal phalangeal heads. There is advanced Charcot change especially at the Lisfranc and tarsometatarsal joints, lateral subluxations as seen previously, pes planus and midfoot sag with chronic rocker bottom deformity. There is a small plantar calcaneal spur. No destructive bone lesions are seen. IMPRESSION: 1. Diffuse moderate edema. 2. Interval removal of skin staples overlying the lateral midfoot with resolution of patchy soft tissue gas in the area. 3. Advanced Charcot change with lateral metatarsal subluxations, pes planus and midfoot sag with chronic rocker bottom deformity. 4.  Osteopenia. No evidence of fractures or focal destructive lesions. 5. Heavy vascular calcifications. 6. Old fifth toe amputation and remote resection of the second through fourth proximal phalangeal heads. Electronically Signed   By: Francis Quam M.D.   On: 11/08/2023 06:36   CT CERVICAL SPINE WO CONTRAST Result Date: 11/08/2023 EXAM: CT CERVICAL SPINE WITHOUT CONTRAST 11/08/2023 05:17:53 AM TECHNIQUE: CT of the cervical was performed without the administration of intravenous contrast. Multiplanar reformatted images are provided for review. Automated exposure control, iterative reconstruction, and/or weight based adjustment of the mA/kV was utilized to reduce the radiation dose to as low as reasonably achievable. COMPARISON: None available. CLINICAL HISTORY: Polytrauma, blunt. Chief complaints; Level 2 FOT; CT HEAD WO CONTRAST; Head trauma, moderate-severe; CT CERVICAL SPINE WO CONTRAST; Polytrauma, blunt FINDINGS: CERVICAL SPINE: BONES AND ALIGNMENT: Solid fusion of the cervical spine is present at C4-5, C5-6, and C6-7. Hardware is intact. Straightening of the normal cervical lordosis is present. DEGENERATIVE CHANGES: No significant degenerative changes. SOFT TISSUES: No prevertebral soft tissue swelling. Mild atherosclerotic changes are present at the carotid bifurcations bilaterally without definite stenosis. IMPRESSION: 1. No acute abnormality of the cervical spine related to the polytrauma. 2. Solid fusion of the cervical spine at C4-5, C5-6, and C6-7 with intact hardware. Electronically signed by: Lonni Necessary MD 11/08/2023 05:29 AM EDT RP Workstation: [id number]   DG Chest Port 1 View Result Date: 11/08/2023 CLINICAL DATA:  Fall. EXAM: PORTABLE CHEST 1 VIEW COMPARISON:  None Available. FINDINGS: Low volume film. The cardio pericardial silhouette is enlarged. The lungs are clear without focal pneumonia, edema, pneumothorax or pleural effusion. Multiple nonacute rib fractures evident bilaterally.  Status post left shoulder replacement. Telemetry leads overlie the chest. IMPRESSION: Low volume film without acute cardiopulmonary findings. Electronically Signed   By: Camellia Candle M.D.   On: 11/08/2023 05:27   CT HEAD WO CONTRAST Result Date: 11/08/2023 EXAM: CT HEAD WITHOUT CONTRAST 11/08/2023 05:17:53 AM TECHNIQUE: CT of the head was performed without the administration of intravenous contrast. Automated exposure control, iterative reconstruction, and/or weight based adjustment of the mA/kV was utilized to reduce the radiation dose to as  low as reasonably achievable. COMPARISON: CT head without contrast from 09/13/2020. MR head without and with contrast from 05/21/2022. CLINICAL HISTORY: Head trauma, moderate-severe. Chief complaints; Level 2 FOT; Head trauma, moderate-severe; Polytrauma, blunt. FINDINGS: BRAIN AND VENTRICLES: There is no acute intracranial hemorrhage, mass effect or midline shift. No abnormal extra-axial fluid collection. The gray-white differentiation is maintained without an acute infarct. There is no hydrocephalus. Periventricular white matter hypoattenuation, mildly advanced for age, is stable. ORBITS: Bilateral lens replacements are noted. The globes and orbits are otherwise within normal limits. SINUSES: The visualized paranasal sinuses and mastoid air cells demonstrate no acute abnormality. SOFT TISSUES AND SKULL: No acute abnormality of the visualized skull or soft tissues. Atherosclerotic calcifications are present in the cavernous carotid arteries bilaterally. No hyperdense vessel is present. IMPRESSION: 1. No acute intracranial abnormality related to the head trauma. 2. Stable periventricular white matter hypoattenuation, mildly advanced for age. Electronically signed by: Lonni Necessary MD 11/08/2023 05:25 AM EDT RP Workstation: [id number]    Labs:  Basic Metabolic Panel: Recent Labs  Lab 12/01/23 0516 12/04/23 0615  NA 138 141  K 3.4* 3.8  CL 103 104  CO2 26 28   GLUCOSE 82 85  BUN 29* 28*  CREATININE 0.65 0.76  CALCIUM  9.0 9.2    CBC: Recent Labs  Lab 12/01/23 0516 12/04/23 0615  WBC 10.2 8.5  HGB 9.7* 10.0*  HCT 31.6* 31.9*  MCV 94.6 94.9  PLT 249 209    CBG: No results for input(s): GLUCAP in the last 168 hours.  Brief HPI:   TYRONNE BLANN is a 78 y.o. male ***   Hospital Course: JAQUE DACY was admitted to rehab 11/16/2023 for inpatient therapies to consist of PT, ST and OT at least three hours five days a week. Past admission physiatrist, therapy team and rehab RN have worked together to provide customized collaborative inpatient rehab.   Blood pressures were monitored on TID basis and   Diabetes has been monitored with ac/hs CBG checks and SSI was use prn for tighter BS control.    Rehab course: During patient's stay in rehab weekly team conferences were held to monitor patient's progress, set goals and discuss barriers to discharge. At admission, patient required  He/She  has had improvement in activity tolerance, balance, postural control as well as ability to compensate for deficits. He/She has had improvement in functional use RUE/LUE  and RLE/LLE as well as improvement in awareness       Disposition: Discharge disposition: 06-Home-Health Care Svc        Diet:  Special Instructions:  Discharge Instructions     Ambulatory referral to Physical Medicine Rehab   Complete by: As directed       Allergies as of 12/05/2023       Reactions   Levaquin  [levofloxacin ] Itching   Tolerated ciprofloxacin  in 2022.        Medication List     STOP taking these medications    allopurinol  100 MG tablet Commonly known as: ZYLOPRIM    cyanocobalamin  1000 MCG/ML injection Commonly known as: VITAMIN B12   diltiazem  30 MG tablet Commonly known as: CARDIZEM        TAKE these medications    acetaminophen  325 MG tablet Commonly known as: TYLENOL  Take 1-2 tablets (325-650 mg total) by mouth every 6 (six)  hours as needed for mild pain (pain score 1-3) (or temp > 100.5).   albuterol  108 (90 Base) MCG/ACT inhaler Commonly known as: VENTOLIN  HFA INHALE 2 PUFFS  BY MOUTH EVERY 4 HOURS AS NEEDED FOR WHEEZE OR FOR SHORTNESS OF BREATH What changed: See the new instructions.   atorvastatin  20 MG tablet Commonly known as: LIPITOR Take 1 tablet (20 mg total) by mouth daily. Notes to patient: Morning dose given. Next dose take tomorrow, 7/12.   cyclobenzaprine  10 MG tablet Commonly known as: FLEXERIL  Take 1 tablet (10 mg total) by mouth 3 (three) times daily as needed for muscle spasms.   diclofenac  Sodium 1 % Gel Commonly known as: VOLTAREN  Apply 4 g topically 4 (four) times daily. What changed:  when to take this reasons to take this   diltiazem  180 MG 24 hr capsule Commonly known as: Cardizem  CD Take 1 capsule (180 mg total) by mouth daily. Notes to patient: Morning dose given. Next dose take tomorrow morning, 7/12.   Eliquis  5 MG Tabs tablet Generic drug: apixaban  Take 1 tablet (5 mg total) by mouth 2 (two) times daily. Notes to patient: Morning dose given. Next dose take this evening, 7/11.   feeding supplement Liqd Take 237 mLs by mouth 2 (two) times daily between meals. Notes to patient: Morning dose given. Take between meals twice a day.   ferrous gluconate  324 MG tablet Commonly known as: FERGON Take 1 tablet (324 mg total) by mouth daily. Notes to patient: Morning dose given. Next dose take tomorrow morning 7/12.   furosemide  40 MG tablet Commonly known as: LASIX  Take 0.5 tablets (20 mg total) by mouth daily. What changed: how much to take Notes to patient: Morning dose given. Next dose take tomorrow morning 7/12.   hydrocerin Crea Apply 1 Application topically daily. To left foot   ipratropium-albuterol  0.5-2.5 (3) MG/3ML Soln Commonly known as: DUONEB 1 NEBULE EVERY 6 HOURS AS NEEDED. **J45.3** What changed:  how much to take how to take this when to take  this reasons to take this additional instructions   meclizine  25 MG tablet Commonly known as: ANTIVERT  TAKE 1 TABLET BY MOUTH 3 TIMES DAILY AS NEEDED FOR DIZZINESS.   melatonin 5 MG Tabs Take 1 tablet (5 mg total) by mouth at bedtime as needed.   multivitamin with minerals Tabs tablet Take 1 tablet by mouth daily. Notes to patient: Morning dose given. Next dose take tomorrow morning 7/12.   ondansetron  8 MG disintegrating tablet Commonly known as: ZOFRAN -ODT Take 1 tablet (8 mg total) by mouth every 8 (eight) hours as needed for nausea.   oxyCODONE  5 MG immediate release tablet Commonly known as: Oxy IR/ROXICODONE  Take 1-2 tablets (5-10 mg total) by mouth every 6 (six) hours as needed for severe pain (pain score 7-10) (5 mg for pain level 5-7 and 10 mg for pain > level 7). What changed: reasons to take this Notes to patient: Limit to 2 pills per day AS NEEDED FOR SEVERE PAIN.    pantoprazole  40 MG tablet Commonly known as: PROTONIX  Take 1 tablet (40 mg total) by mouth 2 (two) times daily before a meal. What changed: See the new instructions. Notes to patient: Morning dose given. Next dose take tonight at bedtime, 7/11.   polyethylene glycol 17 g packet Commonly known as: MIRALAX  / GLYCOLAX  Take 17 g by mouth daily.   potassium chloride  SA 20 MEQ tablet Commonly known as: KLOR-CON  M Take 2 tablets (40 mEq total) by mouth daily. What changed: how much to take Notes to patient: Morning dose given. Next dose take tomorrow 7/12.   predniSONE  10 MG tablet Commonly known as: DELTASONE  Take 20 mg  by mouth daily with breakfast. Notes to patient: Morning dose given today. Next dose take tomorrow morning 7/12.   pregabalin  100 MG capsule Commonly known as: Lyrica  Take 1 capsule (100 mg total) by mouth 2 (two) times daily.   Striverdi Respimat 2.5 MCG/ACT Aers Generic drug: Olodaterol HCl INHALE 2 PUFFS BY MOUTH INTO THE LUNGS DAILY What changed: See the new instructions.    tamsulosin  0.4 MG Caps capsule Commonly known as: FLOMAX  Take 1 capsule (0.4 mg total) by mouth at bedtime.        Follow-up Information     Curtis Debby PARAS, MD Follow up.   Specialties: Family Medicine, Sports Medicine, Radiology Why: Call in 1-2 days for post hospital follow up Contact information: [address] [place] [address] [phone]         Harden Jerona GAILS, MD Follow up.   Specialty: Orthopedic Surgery Why: Call in 1-2 days for post hospital follow up Contact information: [address] [place] [address] [phone]         Urbano Albright, MD Follow up.   Specialty: Physical Medicine and Rehabilitation Why: office will call you with follow up appointment Contact information: [address] [place] [address] [phone]                 Signed: Sharlet GORMAN Schmitz 12/05/2023, 12:40 PM

## 2023-12-05 NOTE — Progress Notes (Signed)
 Occupational Therapy Session Note  Patient Details  Name: Levi [name] MRN: [id number] Date of Birth: [birth year]  Today's Date: 12/05/2023 OT Missed Time: 30 Minutes Missed Time Reason: Unavailable (comment) (PT unavailable for DC transfer to car due to delay with discharge paperwork)   Short Term Goals: Week 3:  OT Short Term Goal 1 (Week 3): STG=LTG d/t ELOS  Attempted to see patient however delay in discharge paperwork being filed. Rescheduled 2x, however still unable to see.   [name] 12/05/2023, 7:58 AM

## 2023-12-05 NOTE — Plan of Care (Signed)
  Problem: RH Balance Goal: LTG Patient will maintain dynamic standing with ADLs (OT) Description: LTG:  Patient will maintain dynamic standing balance with assist during activities of daily living (OT)  Outcome: Not Met (add Reason) Note: Not met due to decreased standing balance and LE strength secondary to core weakness

## 2023-12-07 DIAGNOSIS — E039 Hypothyroidism, unspecified: Secondary | ICD-10-CM | POA: Diagnosis not present

## 2023-12-07 DIAGNOSIS — E538 Deficiency of other specified B group vitamins: Secondary | ICD-10-CM | POA: Diagnosis not present

## 2023-12-07 DIAGNOSIS — T8143XD Infection following a procedure, organ and space surgical site, subsequent encounter: Secondary | ICD-10-CM | POA: Diagnosis not present

## 2023-12-07 DIAGNOSIS — L03115 Cellulitis of right lower limb: Secondary | ICD-10-CM | POA: Diagnosis not present

## 2023-12-07 DIAGNOSIS — A419 Sepsis, unspecified organism: Secondary | ICD-10-CM | POA: Diagnosis not present

## 2023-12-07 DIAGNOSIS — I872 Venous insufficiency (chronic) (peripheral): Secondary | ICD-10-CM | POA: Diagnosis not present

## 2023-12-07 DIAGNOSIS — D509 Iron deficiency anemia, unspecified: Secondary | ICD-10-CM | POA: Diagnosis not present

## 2023-12-07 DIAGNOSIS — K5900 Constipation, unspecified: Secondary | ICD-10-CM | POA: Diagnosis not present

## 2023-12-07 DIAGNOSIS — I82813 Embolism and thrombosis of superficial veins of lower extremities, bilateral: Secondary | ICD-10-CM | POA: Diagnosis not present

## 2023-12-07 DIAGNOSIS — G629 Polyneuropathy, unspecified: Secondary | ICD-10-CM | POA: Diagnosis not present

## 2023-12-07 DIAGNOSIS — I5022 Chronic systolic (congestive) heart failure: Secondary | ICD-10-CM | POA: Diagnosis not present

## 2023-12-07 DIAGNOSIS — M519 Unspecified thoracic, thoracolumbar and lumbosacral intervertebral disc disorder: Secondary | ICD-10-CM | POA: Diagnosis not present

## 2023-12-07 DIAGNOSIS — T8144XD Sepsis following a procedure, subsequent encounter: Secondary | ICD-10-CM | POA: Diagnosis not present

## 2023-12-07 DIAGNOSIS — J4540 Moderate persistent asthma, uncomplicated: Secondary | ICD-10-CM | POA: Diagnosis not present

## 2023-12-07 DIAGNOSIS — M869 Osteomyelitis, unspecified: Secondary | ICD-10-CM | POA: Diagnosis not present

## 2023-12-07 DIAGNOSIS — J4489 Other specified chronic obstructive pulmonary disease: Secondary | ICD-10-CM | POA: Diagnosis not present

## 2023-12-07 DIAGNOSIS — I480 Paroxysmal atrial fibrillation: Secondary | ICD-10-CM | POA: Diagnosis not present

## 2023-12-07 DIAGNOSIS — Z89422 Acquired absence of other left toe(s): Secondary | ICD-10-CM | POA: Diagnosis not present

## 2023-12-07 DIAGNOSIS — N401 Enlarged prostate with lower urinary tract symptoms: Secondary | ICD-10-CM | POA: Diagnosis not present

## 2023-12-07 DIAGNOSIS — E785 Hyperlipidemia, unspecified: Secondary | ICD-10-CM | POA: Diagnosis not present

## 2023-12-07 DIAGNOSIS — Z89511 Acquired absence of right leg below knee: Secondary | ICD-10-CM | POA: Diagnosis not present

## 2023-12-07 DIAGNOSIS — I5030 Unspecified diastolic (congestive) heart failure: Secondary | ICD-10-CM | POA: Diagnosis not present

## 2023-12-07 DIAGNOSIS — M109 Gout, unspecified: Secondary | ICD-10-CM | POA: Diagnosis not present

## 2023-12-07 DIAGNOSIS — I110 Hypertensive heart disease with heart failure: Secondary | ICD-10-CM | POA: Diagnosis not present

## 2023-12-07 DIAGNOSIS — J439 Emphysema, unspecified: Secondary | ICD-10-CM | POA: Diagnosis not present

## 2023-12-08 ENCOUNTER — Telehealth: Payer: Self-pay

## 2023-12-08 ENCOUNTER — Telehealth: Payer: Self-pay | Admitting: Orthopedic Surgery

## 2023-12-08 NOTE — Telephone Encounter (Signed)
Called, no answer, LMTCB

## 2023-12-08 NOTE — Telephone Encounter (Signed)
 Copied from CRM [id number]. Topic: Clinical - Home Health Verbal Orders >> Dec 08, 2023  9:52 AM Kevelyn M wrote: Caller/Agency: Angela/[name] Callback Number: [phone] Service Requested: Skilled Nursing Frequency: 1 time a week for 3 weeks/every other week for 6 weeks. Any new concerns about the patient? No

## 2023-12-08 NOTE — Telephone Encounter (Signed)
 Patient called and needs an appointment for post op he just came out of rehab. RA#[phone]

## 2023-12-08 NOTE — Telephone Encounter (Signed)
 Wife called back, I have scheduled him this Thursday 12/11/23 to see Dr. Harden.

## 2023-12-09 ENCOUNTER — Telehealth: Payer: Self-pay

## 2023-12-09 DIAGNOSIS — M869 Osteomyelitis, unspecified: Secondary | ICD-10-CM | POA: Diagnosis not present

## 2023-12-09 DIAGNOSIS — L03115 Cellulitis of right lower limb: Secondary | ICD-10-CM | POA: Diagnosis not present

## 2023-12-09 DIAGNOSIS — A419 Sepsis, unspecified organism: Secondary | ICD-10-CM | POA: Diagnosis not present

## 2023-12-09 DIAGNOSIS — T8144XD Sepsis following a procedure, subsequent encounter: Secondary | ICD-10-CM | POA: Diagnosis not present

## 2023-12-09 DIAGNOSIS — I480 Paroxysmal atrial fibrillation: Secondary | ICD-10-CM | POA: Diagnosis not present

## 2023-12-09 DIAGNOSIS — T8143XD Infection following a procedure, organ and space surgical site, subsequent encounter: Secondary | ICD-10-CM | POA: Diagnosis not present

## 2023-12-09 NOTE — Telephone Encounter (Signed)
 Lorie- PT with [facility]  [phone] called  Requesting verbal orders approval  For  PT - 1 week x 1, 2 week x 4  and 1 x week  for 4 weeks.  For strength, balance and exercises.

## 2023-12-09 NOTE — Telephone Encounter (Signed)
 Called and left verbal order approval on lorie_ PT with [facility] voice mail.

## 2023-12-09 NOTE — Telephone Encounter (Signed)
 Jon with [name] informed of verbal orders approval.

## 2023-12-09 NOTE — Telephone Encounter (Signed)
 Verbal orders given

## 2023-12-09 NOTE — Telephone Encounter (Signed)
 Spoke with lorie  Created a telephone message to Dr. Curtis to request verbal order approval  Sent in separate message.

## 2023-12-09 NOTE — Telephone Encounter (Signed)
 Copied from CRM [id number]. Topic: General - Other >> Dec 09, 2023 10:22 AM Miquel SAILOR wrote: Reason for CRM: Lore from [facility] calling for Dr. ONEIDA patient. Called office and transferred

## 2023-12-11 ENCOUNTER — Ambulatory Visit (INDEPENDENT_AMBULATORY_CARE_PROVIDER_SITE_OTHER): Admitting: Orthopedic Surgery

## 2023-12-11 DIAGNOSIS — Z89511 Acquired absence of right leg below knee: Secondary | ICD-10-CM

## 2023-12-12 ENCOUNTER — Telehealth: Payer: Self-pay | Admitting: Orthopedic Surgery

## 2023-12-12 NOTE — Telephone Encounter (Signed)
 Patient called and said they want to hear from you what [facility] is telling them. He has a appointment for 2wks back with them to make sure the shrinkers are good. His wife is wondering why she kept using get the ball rolling and is like we don't want the ball rolling too soon. RA#[phone]

## 2023-12-14 ENCOUNTER — Encounter: Payer: Self-pay | Admitting: Orthopedic Surgery

## 2023-12-14 NOTE — Progress Notes (Signed)
 Office Visit Note   Patient: Levi [name]           Date of Birth: Nov 10, 1945           MRN: [id number] Visit Date: 12/11/2023              Requested by: Curtis Debby PARAS, MD [address] [place],  [place] [address] PCP: Curtis Debby PARAS, MD  Chief Complaint  Patient presents with   Right Leg - Routine Post Op      HPI: Patient is a 78 year old gentleman who is 4 weeks status post right below-knee amputation.  Assessment & Plan: Visit Diagnoses:  1. Right below-knee amputee [facility])     Plan: Staples are harvested prescription provided for [employer] for shrinker and prosthesis.  Follow-Up Instructions: No follow-ups on file.   Ortho Exam  Patient is alert, oriented, no adenopathy, well-dressed, normal affect, normal respiratory effort. Examination the right residual limb is well-approximated and healing well.  Examination of the left foot has a good dorsalis pedis pulse patient has a great toe abrasion over the IP joint and recommended a silicone sleeve.  Patient is a new right transtibial  amputee.  Patient's current comorbidities are not expected to impact the ability to function with the prescribed prosthesis. Patient verbally communicates a strong desire to use a prosthesis. Patient currently requires mobility aids to ambulate without a prosthesis.  Expects not to use mobility aids with a new prosthesis. Patient is expected to resume or reach their K Level within 6 months. Patient was active before the amputation and independent with stairs, uneven terrain, varying cadence, and a community ambulator.  Patient is a K2 level ambulator that will use a prosthesis to walk around their home and the community over low level environmental barriers.        Imaging: No results found. No images are attached to the encounter.  Labs: Lab Results  Component Value Date   HGBA1C 5.8 (H) 07/05/2022   HGBA1C 5.5 10/21/2019   ESRSEDRATE 30 (H)  11/08/2023   ESRSEDRATE 11 10/08/2023   ESRSEDRATE 17 03/31/2023   CRP 21.0 (H) 11/10/2023   CRP 16.5 (H) 11/08/2023   CRP 11.7 (H) 03/31/2023   LABURIC 5.6 06/17/2023   LABURIC 6.4 07/05/2022   REPTSTATUS 11/13/2023 FINAL 11/08/2023   GRAMSTAIN NO WBC SEEN NO ORGANISMS SEEN  10/16/2023   CULT  11/08/2023    NO GROWTH 5 DAYS Performed at [facility], [address], [place], [place] [address]    [facility] STAPHYLOCOCCUS COHNII 10/16/2023     Lab Results  Component Value Date   ALBUMIN 1.9 (L) 11/17/2023   ALBUMIN 2.9 (L) 11/08/2023   ALBUMIN 3.6 (L) 11/03/2023    Lab Results  Component Value Date   MG 1.7 11/10/2023   MG 2.1 08/13/2023   No results found for: VD25OH  No results found for: PREALBUMIN    Latest Ref Rng & Units 12/04/2023    6:15 AM 12/01/2023    5:16 AM 11/27/2023    5:02 AM  CBC EXTENDED  WBC 4.0 - 10.5 K/uL 8.5  10.2  10.2   RBC 4.22 - 5.81 MIL/uL 3.36  3.34  3.29   Hemoglobin 13.0 - 17.0 g/dL 89.9  9.7  9.6   HCT 60.9 - 52.0 % 31.9  31.6  31.3   Platelets 150 - 400 K/uL 209  249  336      There is no height or weight  on file to calculate BMI.  Orders:  No orders of the defined types were placed in this encounter.  No orders of the defined types were placed in this encounter.    Procedures: No procedures performed  Clinical Data: No additional findings.  ROS:  All other systems negative, except as noted in the HPI. Review of Systems  Objective: Vital Signs: There were no vitals taken for this visit.  Specialty Comments:  No specialty comments available.  PMFS History: Patient Active Problem List   Diagnosis Date Noted   Coping style affecting medical condition 11/24/2023   S/P BKA (below knee amputation) unilateral, right (HCC) 11/16/2023   Right foot infection 11/08/2023   Ulcerated, foot, right, with fat layer exposed (HCC) 10/08/2023   Blood blister 09/04/2023   Osteomyelitis of fifth toe of right foot (HCC)  08/13/2023   Osteomyelitis (HCC) 08/13/2023   Skin ulcer of second toe of left foot with necrosis of bone (HCC) 08/12/2023   Osteomyelitis of second toe of left foot (HCC) 08/12/2023   Hematochezia 07/25/2023   Pain in toe of right fifth foot 06/27/2023   Cellulitis 04/17/2023   Closed fracture of ramus of right pubis (HCC) 04/07/2023   S/P PICC central line placement 03/31/2023   Venous insufficiency of both lower extremities 03/20/2023   Sepsis (HCC) 03/18/2023   Chronic superficial venous thrombosis of both lower extremities 11/18/2022   Venous stasis ulcer (HCC) 09/13/2022   Primary osteoarthritis, right shoulder 07/05/2022   Shingles 05/16/2022   Vertigo 04/04/2022   BPH (benign prostatic hyperplasia) 02/21/2022   Annual physical exam 01/22/2022   Charcot's joint of foot, right 01/22/2022   Lower extremity edema 08/08/2021   Heartburn    Atypical chest pain    Cervical radicular pain 06/11/2021   Chronic anticoagulation 03/15/2021   Peripheral neuropathy 02/22/2021   Chronic systolic heart failure (HCC) 02/22/2021   Essential (primary) hypertension 11/15/2020   Arthritis of carpometacarpal (CMC) joint of right thumb 08/07/2020   COPD (chronic obstructive pulmonary disease) (HCC) 03/21/2020   GERD (gastroesophageal reflux disease) status post Nissen fundoplication 12/03/2017   Lumbar stenosis status post T12-S1 fusion 04/07/2017   Cervical spondylosis with radiculopathy 01/01/2017   Cervical radiculopathy 04/11/2016   History of total right hip arthroplasty 07/31/2015   Chronic low back pain 10/13/2014   Primary osteoarthritis of right knee 06/17/2014   Thrush of mouth and esophagus (HCC) 04/03/2014   Hyperlipidemia    History of colonic polyps 02/09/2013   Paroxysmal atrial fibrillation (HCC) 05/11/2012   Osteoarthritis of left knee 01/10/2012   Seasonal and perennial allergic rhinitis 07/23/2010   Asthma, moderate persistent 03/06/2009   Past Medical History:   Diagnosis Date   Agent orange exposure 1970's   takes Imdur ,Diltiazem , and Atacand  daily   Agent orange exposure    ALLERGIC RHINITIS    Allergy     seasonal allergies   Anemia    iron  deficiency   Arthritis    Asthma    uses inhaler and nebulizers   Atrial fibrillation (HCC)    Bruises easily    d/t prednisone  daily   Cataract    Cellulitis 03/22/2020   Chest wall pain 09/21/2019   L scapula, reported 4/27 21/     CHF (congestive heart failure) (HCC)    dx-on meds to treat   Chronic bronchitis (HCC)    used to get it q yr; last time ?2008 (04/28/2012)   Clotting disorder (HCC) 2019   On blood thinner when I bleed  it is for a consideral amount of time. This is tegardless of size of wound.   COPD (chronic obstructive pulmonary disease) (HCC)    agent orange exposure   Degenerative disk disease    everywhere (04/28/2012)   Diverticulitis    Dysrhythmia    afib   Elevated uric acid in blood    takes Allopurinol  daily   Emphysema of lung (HCC)    Enlarged prostate    but not on any meds   GERD (gastroesophageal reflux disease)    takes Protonix  daily   H/O hiatal hernia    Headache    History of colonoscopy    History of pneumonia    a. 2010   Hyperlipidemia    takes Lipitor daily; pt. states he takes a preventive   Hypothyroidism    Joint pain    Joint swelling    Leukocytosis    MSSA bacteremia 03/19/2023   Nausea and vomiting 11/12/2022   Neuromuscular disorder (HCC)    bilat legs, and bilat arms   Neuropathy    Osteoporosis 2012   PAF (paroxysmal atrial fibrillation) (HCC)    a. Dx 04/2012, CHA2DS2VASc = 1 (age);  b. 04/2012 Echo: EF 40-50%, mild MR.   Personal history of colonic adenomas 02/09/2013   Pneumonia    Sepsis (HCC) 03/18/2023   Shortness of breath dyspnea    Thyroid  disease    no taking meds-caused dizziness    Family History  Problem Relation Age of Onset   Diabetes Mother    Heart disease Father        Died 57, MI   Cancer Father     Hyperlipidemia Father    Stroke Father    Prostate cancer Brother    Colon polyps Brother    Colon polyps Brother    Asthma Brother    COPD Brother    Colon cancer Brother        Dx age 83   Cancer Brother    Colon cancer Other    Esophageal cancer Neg Hx    Rectal cancer Neg Hx    Stomach cancer Neg Hx    Pancreatic cancer Neg Hx    Kidney disease Neg Hx    Liver disease Neg Hx    Neuropathy Neg Hx     Past Surgical History:  Procedure Laterality Date   61 HOUR PH STUDY N/A 05/30/2021   Procedure: 24 HOUR PH STUDY;  Surgeon: San Sandor GAILS, DO;  Location: [facility];  Service: Gastroenterology;  Laterality: N/A;   AMPUTATION Right 11/14/2023   Procedure: AMPUTATION BELOW KNEE;  Surgeon: Harden Jerona GAILS, MD;  Location: [facility] OR;  Service: Orthopedics;  Laterality: Right;   AMPUTATION TOE Bilateral 08/13/2023   Procedure: LEFT SECOND TOE AMPUTATION AND RIGHT FIFTH TOE AMPUTATION;  Surgeon: Malvin Marsa FALCON, DPM;  Location: [facility] OR;  Service: Orthopedics/Podiatry;  Laterality: Bilateral;   ANTERIOR CERVICAL DECOMP/DISCECTOMY FUSION N/A 07/21/2012   Procedure: ANTERIOR CERVICAL DECOMPRESSION/DISCECTOMY FUSION 2 LEVELS;  Surgeon: Victory Gens, MD;  Location: [facility];  Service: Neurosurgery;  Laterality: N/A;  C4-5 C5-6 Anterior cervical decompression/diskectomy/fusion   ANTERIOR LAT LUMBAR FUSION N/A 04/07/2017   Procedure: Thoracic twelve-Lumbar one Anterolateral decompression/fusion;  Surgeon: Gens Victory, MD;  Location: [facility] OR;  Service: Neurosurgery;  Laterality: N/A;   ARTHRODESIS FOOT WITH WEIL OSTEOTOMY Left 11/07/2022   Procedure: LEFT THIRD METATARSOPHALANGEAL CAPSULOTOMY, WEIL OSTEOTOMY;  Surgeon: Elsa Lonni SAUNDERS, MD;  Location: [facility] OR;  Service: Orthopedics;  Laterality: Left;  LENGTH OF SURGERY: 60 MINUTES   BACK SURGERY     x 3   BONE EXOSTOSIS EXCISION Right 10/16/2023   Procedure: EXCISION, EXOSTOSIS;  Surgeon: Janit Thresa HERO, DPM;  Location: [facility]S;   Service: Orthopedics/Podiatry;  Laterality: Right;   CARDIAC CATHETERIZATION  11/2008   Dr. Jeffrie - 20% calcified non flow limiting left main, 50% EF apical hypokinesis   CARPAL TUNNEL RELEASE Bilateral    CERVICAL DISC ARTHROPLASTY N/A 04/07/2017   Procedure: Cervical six-seven Disc arthroplasty;  Surgeon: Colon Shove, MD;  Location: [facility] OR;  Service: Neurosurgery;  Laterality: N/A;   CHEST TUBE INSERTION Left 04/11/2017   Procedure: CHEST TUBE INSERTION;  Surgeon: Fleeta Hanford Coy, MD;  Location: [facility] OR;  Service: Thoracic;  Laterality: Left;   CHOLECYSTECTOMY     COLONOSCOPY  2016   CG-MAC-miralax (good)servere TICS/TA x 2   ESOPHAGEAL MANOMETRY N/A 05/30/2021   Procedure: ESOPHAGEAL MANOMETRY (EM);  Surgeon: San Sandor GAILS, DO;  Location: [facility];  Service: Gastroenterology;  Laterality: N/A;  ph impedence   ESOPHAGOGASTRODUODENOSCOPY     EYE SURGERY Bilateral 2011   steroidal encapsulation (04/28/2012)   FOOT SURGERY Right    x 2   JOINT REPLACEMENT     Many surgeries including 5 joit replacements   KNEE ARTHROSCOPY Bilateral    LATERAL / POSTERIOR COMBINED FUSION LUMBAR SPINE  2012   LEFT ATRIAL APPENDAGE OCCLUSION N/A 02/22/2021   Procedure: LEFT ATRIAL APPENDAGE OCCLUSION;  Surgeon: Cindie Ole DASEN, MD;  [facility]: [facility];  Service: Cardiovascular;  Laterality: N/A;   NEUROPLASTY / TRANSPOSITION ULNAR NERVE AT ELBOW Bilateral    POLYPECTOMY  2016   severe TICS/TA x 2   POSTERIOR CERVICAL LAMINECTOMY WITH MET- RX Right 06/11/2021   Procedure: Right Cervical six-seven  Laminectomy and foraminotomy with metrex;  Surgeon: Colon Shove, MD;  Location: [facility] OR;  Service: Neurosurgery;  Laterality: Right;   REFRACTIVE SURGERY Left 07/07/2023   laser surgery but still has floaters   REVERSE SHOULDER ARTHROPLASTY  04/28/2012   Procedure: REVERSE SHOULDER ARTHROPLASTY;  Surgeon: Eva Elsie Herring, MD;  Location: [facility] OR;  Service: Orthopedics;  Laterality:  Left;  Left shouder reverse total shoulder arthroplasty   SHOULDER SURGERY     SPINE SURGERY     I have had 7 spinal surgeries   TEE WITHOUT CARDIOVERSION N/A 02/22/2021   Procedure: TRANSESOPHAGEAL ECHOCARDIOGRAM (TEE);  Surgeon: Cindie Ole DASEN, MD;  Location: [facility] INVASIVE CV LAB;  Service: Cardiovascular;  Laterality: N/A;   TENDON REPAIR Right 08/07/2020   Procedure: RIGHT HAND LIGAMENT RECONSTRUCTION AND TENDON INTERPOSITION;  Surgeon: Yvone Rush, MD;  Location: [facility];  Service: Orthopedics;  Laterality: Right;   TONSILLECTOMY AND ADENOIDECTOMY  1954   TOTAL HIP ARTHROPLASTY Left 2011   left (04/28/2012)   TOTAL HIP ARTHROPLASTY Right 07/31/2015   Procedure: TOTAL HIP ARTHROPLASTY ANTERIOR APPROACH;  Surgeon: Rush Yvone, MD;  Location: [facility] OR;  Service: Orthopedics;  Laterality: Right;   TOTAL KNEE ARTHROPLASTY  01/10/2012   Procedure: TOTAL KNEE ARTHROPLASTY;  Surgeon: Rush LITTIE Yvone, MD;  Location: [facility] OR;  Service: Orthopedics;;  left total knee arthroplasty   TOTAL KNEE ARTHROPLASTY Right 06/17/2014   Procedure: TOTAL KNEE ARTHROPLASTY;  Surgeon: Rush LITTIE Yvone, MD;  Location: [facility] OR;  Service: Orthopedics;  Laterality: Right;   toupet fundolplication  11/01/2021   TRANSESOPHAGEAL ECHOCARDIOGRAM (CATH LAB) N/A 03/24/2023   Procedure: TRANSESOPHAGEAL ECHOCARDIOGRAM;  Surgeon: Mona Vinie BROCKS, MD;  Location: [facility] INVASIVE CV  LAB;  Service: Cardiovascular;  Laterality: N/A;   Social History   Occupational History   Occupation: Retired    Comment: Nurse, 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

## 2023-12-15 ENCOUNTER — Ambulatory Visit (INDEPENDENT_AMBULATORY_CARE_PROVIDER_SITE_OTHER): Admitting: Sports Medicine

## 2023-12-15 ENCOUNTER — Encounter: Payer: Self-pay | Admitting: Sports Medicine

## 2023-12-15 VITALS — BP 134/75 | HR 74 | Temp 97.9°F | Resp 20 | Ht 74.0 in

## 2023-12-15 DIAGNOSIS — Z89511 Acquired absence of right leg below knee: Secondary | ICD-10-CM | POA: Diagnosis not present

## 2023-12-15 DIAGNOSIS — A419 Sepsis, unspecified organism: Secondary | ICD-10-CM

## 2023-12-15 DIAGNOSIS — M19011 Primary osteoarthritis, right shoulder: Secondary | ICD-10-CM

## 2023-12-15 DIAGNOSIS — F0631 Mood disorder due to known physiological condition with depressive features: Secondary | ICD-10-CM

## 2023-12-15 MED ORDER — DULOXETINE HCL 30 MG PO CPEP: 30.0000 mg | ORAL_CAPSULE | Freq: Every day | ORAL | 3 refills | Status: DC

## 2023-12-15 NOTE — Progress Notes (Signed)
    Procedures performed today:    None.  Independent interpretation of notes and tests performed by another provider:   None.  Brief History, Exam, Impression, and Recommendations:    History of right below knee amputation (HCC) History of Charcot foot, osteomyelitis, now with below the knee amputation and starting to get much better. He does experience some phantom pains. He is getting stronger. Using a shrinker, and is going to be fit for right lower extremity prosthesis.  Depression due to physical illness Not surprisingly Marinell is a bit depressed status post his right below the knee amputation. He is improving with physical therapy, he does also have some phantom pains, he was on Cymbalta  in the past, this got dropped along the way, we will restart Cymbalta  at 30 mg and revisit this in 6 weeks for dose titration if needed.  Primary osteoarthritis, right shoulder History of left shoulder arthroplasty with Dr. Dozier, he does have cuff tear arthropathy, he was injected earlier this month in the hospital. Still has pain and weakness. His exam is classic for full-thickness cuff tear. We will add home and home health physical therapy, 6-week follow-up. His wife will give me a MyChart message and let me know what company is working on his leg and we can use them for his shoulder therapy as well.    ____________________________________________ Debby PARAS. Curtis, M.D., ABFM., CAQSM., AME. Primary Care and Sports Medicine [facility]  Adjunct Professor of [facility] [employer] Medicine  [employer]  Restaurant manager, fast food

## 2023-12-15 NOTE — Assessment & Plan Note (Signed)
 Not surprisingly Levi [name] is a bit depressed status post his right below the knee amputation. He is improving with physical therapy, he does also have some phantom pains, he was on Cymbalta  in the past, this got dropped along the way, we will restart Cymbalta  at 30 mg and revisit this in 6 weeks for dose titration if needed.

## 2023-12-15 NOTE — Assessment & Plan Note (Signed)
 History of left shoulder arthroplasty with Dr. Dozier, he does have cuff tear arthropathy, he was injected earlier this month in the hospital. Still has pain and weakness. His exam is classic for full-thickness cuff tear. We will add home and home health physical therapy, 6-week follow-up. His wife will give me a MyChart message and let me know what company is working on his leg and we can use them for his shoulder therapy as well.

## 2023-12-15 NOTE — Assessment & Plan Note (Signed)
 History of Charcot foot, osteomyelitis, now with below the knee amputation and starting to get much better. He does experience some phantom pains. He is getting stronger. Using a shrinker, and is going to be fit for right lower extremity prosthesis.

## 2023-12-16 ENCOUNTER — Encounter: Payer: Self-pay | Admitting: Cardiology

## 2023-12-16 ENCOUNTER — Ambulatory Visit: Payer: Self-pay | Admitting: Sports Medicine

## 2023-12-16 DIAGNOSIS — I480 Paroxysmal atrial fibrillation: Secondary | ICD-10-CM | POA: Diagnosis not present

## 2023-12-16 DIAGNOSIS — T8143XD Infection following a procedure, organ and space surgical site, subsequent encounter: Secondary | ICD-10-CM | POA: Diagnosis not present

## 2023-12-16 DIAGNOSIS — T8144XD Sepsis following a procedure, subsequent encounter: Secondary | ICD-10-CM | POA: Diagnosis not present

## 2023-12-16 DIAGNOSIS — L03115 Cellulitis of right lower limb: Secondary | ICD-10-CM | POA: Diagnosis not present

## 2023-12-16 DIAGNOSIS — M869 Osteomyelitis, unspecified: Secondary | ICD-10-CM | POA: Diagnosis not present

## 2023-12-16 DIAGNOSIS — A419 Sepsis, unspecified organism: Secondary | ICD-10-CM | POA: Diagnosis not present

## 2023-12-16 LAB — COMPREHENSIVE METABOLIC PANEL WITH GFR
ALT: 16 IU/L (ref 0–44)
AST: 17 IU/L (ref 0–40)
Albumin: 3.7 g/dL — ABNORMAL LOW (ref 3.8–4.8)
Alkaline Phosphatase: 112 IU/L (ref 44–121)
BUN/Creatinine Ratio: 21 (ref 10–24)
BUN: 15 mg/dL (ref 8–27)
Bilirubin Total: 0.5 mg/dL (ref 0.0–1.2)
CO2: 20 mmol/L (ref 20–29)
Calcium: 9.2 mg/dL (ref 8.6–10.2)
Chloride: 105 mmol/L (ref 96–106)
Creatinine, Ser: 0.71 mg/dL — ABNORMAL LOW (ref 0.76–1.27)
Globulin, Total: 2 g/dL (ref 1.5–4.5)
Glucose: 106 mg/dL — ABNORMAL HIGH (ref 70–99)
Potassium: 4.4 mmol/L (ref 3.5–5.2)
Sodium: 139 mmol/L (ref 134–144)
Total Protein: 5.7 g/dL — ABNORMAL LOW (ref 6.0–8.5)
eGFR: 95 mL/min/1.73 (ref >59)

## 2023-12-16 LAB — CBC
Hematocrit: 38 % (ref 37.5–51.0)
Hemoglobin: 11.9 g/dL — ABNORMAL LOW (ref 13.0–17.7)
MCH: 29 pg (ref 26.6–33.0)
MCHC: 31.3 g/dL — ABNORMAL LOW (ref 31.5–35.7)
MCV: 93 fL (ref 79–97)
Platelets: 147 x10E3/uL — ABNORMAL LOW (ref 150–450)
RBC: 4.1 x10E6/uL — ABNORMAL LOW (ref 4.14–5.80)
RDW: 15.5 % — ABNORMAL HIGH (ref 11.6–15.4)
WBC: 9.9 x10E3/uL (ref 3.4–10.8)

## 2023-12-16 NOTE — Telephone Encounter (Signed)
 I called and sw the pt's wife and she was just concerned b/c [employer] wanted to see the pt prior to his return visit with Dr. Harden. Advised that he went down into a smaller shrinker and that visit would be to make sure the limb has good shape and decreasing in swelling in prep for casting of a prosthetic. Make sure he does not need to go down to an even smaller shrinker. Voiced understanding and advised to please call me with any other questions that we are here to walk them through the process and are available at any time.

## 2023-12-16 NOTE — Telephone Encounter (Signed)
 Please see previous messages and advise, pt reports continuing isosorbide .

## 2023-12-17 ENCOUNTER — Telehealth: Payer: Self-pay | Admitting: Sports Medicine

## 2023-12-17 DIAGNOSIS — T8144XD Sepsis following a procedure, subsequent encounter: Secondary | ICD-10-CM | POA: Diagnosis not present

## 2023-12-17 DIAGNOSIS — T8143XD Infection following a procedure, organ and space surgical site, subsequent encounter: Secondary | ICD-10-CM | POA: Diagnosis not present

## 2023-12-17 DIAGNOSIS — I480 Paroxysmal atrial fibrillation: Secondary | ICD-10-CM | POA: Diagnosis not present

## 2023-12-17 DIAGNOSIS — A419 Sepsis, unspecified organism: Secondary | ICD-10-CM | POA: Diagnosis not present

## 2023-12-17 DIAGNOSIS — L03115 Cellulitis of right lower limb: Secondary | ICD-10-CM | POA: Diagnosis not present

## 2023-12-17 DIAGNOSIS — M869 Osteomyelitis, unspecified: Secondary | ICD-10-CM | POA: Diagnosis not present

## 2023-12-17 NOTE — Progress Notes (Signed)
 Subjective:    Patient ID: Levi [name], male    DOB: March 12, 1946, 78 y.o.   MRN: [id number] HPI  Male never smoker followed for allergy /asthma, rhinitis, history  ABPA, complicated by PAfib, GERD PFT- 01/04/2015-minimal restriction, minimal diffusion defect, insignificant response to bronchodilator. FVC 4.55/86%, FEV1 3.28/83%, FEV1/FVC 0.72, TLC 75%, DLCO 77 Office Spirometry 10/25/16-mild restriction of exhaled volume, minimal obstruction. FVC 4.04/78%, FEV1 2.71/71%, ratio 0.67, FEF 25-75% 1.68/58% Respiratory allergy  profile 10/26/15- total IgE 60, minor elevations for dust mites and Aspergillus fumigatus ---------------------------------------------------------------------------------------------------.   09/23/22- 78 year old male never smoker followed for allergy /asthma, rhinitis, history ABPA, complicated by PAfib, CHF, GERD, Hypogammaglobulinemia, Chronic Steroid Therapy,  -Striverdi 2 puffs daily, pred maint 10 mg daily, Neb Duoneb,  albuterol  hfa Recent fall with right rib fx He is working with cardiology actively now on congestive heart failure and understands the overlap of symptoms.  Asthma control has been good.  He does continue maintenance prednisone  and Striverdi.  Occasional transient wheeze but no significant events.  Unfortunately he has had trouble with peripheral neuropathy, falls and fractures. CXR/ Right ribs 09/05/22- IMPRESSION: There is recent minimally displaced fracture in the anterolateral aspect of right ninth rib. There is no focal pulmonary consolidation. There is no pneumothorax. Deformities are seen in multiple bilateral ribs consistent with old healed fractures.  Addendum 10/30/22- surgical clearance. He is clear from pulmonary standpoint for planned foot surgery.  Recommend cardiac clearance. Note he is on maintenance prednisone .  06/17/23- 78 year old male never smoker followed for Allergy /Asthma, Rhinitis, history ABPA, complicated by PAfib, CHF, GERD,  Hypogammaglobulinemia, Chronic Steroid Therapy,  -Striverdi 2 puffs daily, prednisone  maint 10 mg twice daily, Neb Duoneb,  albuterol  hfa ACT score 17 HR 41/min 150/80, then later 60/min, regular CXR 03/18/23 IMPRESSION: 1. No acute chest findings. 2. Chronic elevation of left hemidiaphragm.  Discussed the use of AI scribe software for clinical note transcription with the patient, who gave verbal consent to proceed.  History of Present Illness   The patient, with a history of atrial fibrillation and asthma, reports an increased need for his rescue inhaler over the past four to six weeks. He has been using it a couple of times daily, compared to previously going days without needing it. He attributes this increase to the colder weather and recent hospitalizations for sepsis in October and November. He also reports feeling lightheaded frequently, but denies any acute lightheadedness during the visit.  The patient is currently on diltiazem  for atrial fibrillation, which has resulted in a slower heart rate. He also takes prednisone  10mg  twice daily for his asthma, and has been using a nebulizer machine intermittently over the past four to six weeks.   Assessment and Plan:    Asthma Increased use of rescue inhaler over the past 4-6 weeks, possibly due to recent hospitalizations for sepsis and/or colder weather. No recent chest imaging needed as last chest x-ray in October was stable. -Continue Striverdi. -Use albuterol  rescue inhaler or nebulizer machine up to every 6 hours as needed.  Chronic Prednisone  Use Currently on 10mg  twice daily. Discussed potential side effects (he is looking somewhat cushingoid) and benefits of reducing dosage. -Reduce prednisone  to 15mg  daily (10mg  in the morning, 5mg  in the evening).  Atrial Fibrillation Noted slow heart rate potentially due to diltiazem  use. No symptoms of lightheadedness reported. 40/min by nurse, checked twice, but about 60 by me when I checked  him. -Continue current management.  General Health Maintenance -Continue current medications. -Pharmacy will contact for refills  as needed.   12/18/23-  78 year old male never smoker followed for Allergy /Asthma, Rhinitis, history ABPA, complicated by PAfib, CHF, GERD, Hypogammaglobulinemia, Chronic Steroid Therapy, R BKA 2025,  -Striverdi 2 puffs daily, prednisone  maint 10 mg twice daily, Neb Duoneb,  albuterol  hfa   Hosp 6/25-7/11/25- R BKA for acute osteo from R foot infection.,  CXR 1V- 11/08/23 IMPRESSION: Low volume film without acute cardiopulmonary findings.  ----Asthma has been stable. Wife here. Discussed the use of AI scribe software for clinical note transcription with the patient, who gave verbal consent to proceed.  History of Present Illness   Kym Fenter is a 78 year old male who presents for management of chronic asthma.  He is on a regimen of 10 mg prednisone  twice daily for chronic respiratory issues without significant side effects such as muscle weakness or joint aches. He also uses Striverdi and other medications for his respiratory condition. He reports no new symptoms or issues with his breathing. Wife here. We discussed prednisone  side effects, withdrawal and tapering.   Recent R BKA after foot infection. Pending prosthesis.     Assessment and Plan:    Asthma- moderate persistent uncomplicated -Continue present meds -try reducing prednisone  to 15 mg daily from 20.   Long-term use of prednisone  Chronic use for respiratory issues with adrenal suppression risk. Discussed tapering to minimize side effects and potential switch to hydrocortisone . Infection risk noted. - Reduce prednisone  to 1.5 mg daily. - Monitor for adrenal insufficiency symptoms. - Consider endocrinologist referral if tapering issues arise. - Discuss hydrocortisone  switch if tapering not tolerated.     ROS-see HPI   + = positive Constitutional:    weight loss, night sweats, fevers,  chills, fatigue, lassitude. HEENT:    headaches, difficulty swallowing, tooth/dental problems, sore throat,       sneezing, itching, ear ache, nasal congestion, post nasal drip, snoring CV:    chest pain, orthopnea, PND, +swelling in lower extremities, anasarca,                                            dizziness, palpitations Resp:   + shortness of breath with exertion or at rest.                productive cough,  + non-productive cough, coughing up of blood.              change in color of mucus.   wheezing.   Skin:    rash or lesions. GI:  No-   heartburn, indigestion, abdominal pain, nausea, vomiting, diarrhea,                 change in bowel habits, loss of appetite GU: dysuria, change in color of urine, no urgency or frequency.   flank pain. MS:   joint pain, stiffness, decreased range of motion, back pain.. L scapula not tender. Neuro-     nothing unusual Psych:  change in mood or affect.  depression or anxiety.   memory loss.    Objective:  OBJ- Physical Exam General- Alert, Oriented, Affect-appropriate, Distress- none acute, tall/thin. +Looking somewhat cushingoid- fuller in face today Skin- rash-none, lesions- none, excoriation- none Lymphadenopathy- none Head- atraumatic            Eyes- Gross vision intact, PERRLA, conjunctivae and secretions clear  Ears- Hearing, canals-normal            Nose- Clear, no-Septal dev, mucus, polyps, erosion, perforation             Throat- Mallampati II , mucosa clear , drainage- none, tonsils- atrophic Neck- flexible , trachea midline, no stridor , thyroid  nl, carotid no bruit Chest - symmetrical excursion , unlabored           Heart/CV- RRR , no murmur , no gallop  , no rub, nl s1 s2                           - JVD+trace , edema+1 pitting, stasis changes- none, varices+ Left calf           Lung- +clear,  wheeze- none, cough- none , dullness-none, rub- none           Chest wall-  Abd-  Br/ Gen/ Rectal- Not done, not  indicated Extrem- cyanosis- none, clubbing, none, atrophy- none, strength- nl Neuro- + head bob and hands tremor    Assessment & Plan:

## 2023-12-17 NOTE — Telephone Encounter (Signed)
 [facility] called office requesting occupational Therapy orders on patient.   [phone] Fax:[phone]

## 2023-12-17 NOTE — Telephone Encounter (Signed)
 They were placed yesterday and should be under the referrals tab orders from yesterday.

## 2023-12-17 NOTE — Telephone Encounter (Signed)
 Orders have been faxed to [facility].

## 2023-12-17 NOTE — Telephone Encounter (Signed)
 Orders needed to specify Occupational Therapy as well as PT per [facility].

## 2023-12-18 ENCOUNTER — Encounter: Payer: Self-pay | Admitting: Internal Medicine

## 2023-12-18 ENCOUNTER — Ambulatory Visit (INDEPENDENT_AMBULATORY_CARE_PROVIDER_SITE_OTHER): Admitting: Internal Medicine

## 2023-12-18 VITALS — BP 118/72 | HR 90 | Temp 98.0°F | Ht 74.0 in | Wt 186.0 lb

## 2023-12-18 DIAGNOSIS — J4540 Moderate persistent asthma, uncomplicated: Secondary | ICD-10-CM

## 2023-12-18 MED ORDER — ISOSORBIDE MONONITRATE ER 30 MG PO TB24: 30.0000 mg | ORAL_TABLET | Freq: Every day | ORAL | 3 refills | Status: DC

## 2023-12-18 NOTE — Telephone Encounter (Signed)
 Verbal orders given

## 2023-12-18 NOTE — Patient Instructions (Signed)
 When you feel stable from your leg surgery and related issues, you can try reducing your daily prednisone  to 15 mg daily. See if your breathing remains comfortable.  Please call if we can help

## 2023-12-18 NOTE — Telephone Encounter (Signed)
 Okay please call and give them a verbal order to add occupational therapy, same parameters as the previous order for physical therapy, and I will sign off on any of the orders that they fax to us .

## 2023-12-20 DIAGNOSIS — T8144XD Sepsis following a procedure, subsequent encounter: Secondary | ICD-10-CM | POA: Diagnosis not present

## 2023-12-20 DIAGNOSIS — L03115 Cellulitis of right lower limb: Secondary | ICD-10-CM | POA: Diagnosis not present

## 2023-12-20 DIAGNOSIS — A419 Sepsis, unspecified organism: Secondary | ICD-10-CM | POA: Diagnosis not present

## 2023-12-20 DIAGNOSIS — M869 Osteomyelitis, unspecified: Secondary | ICD-10-CM | POA: Diagnosis not present

## 2023-12-20 DIAGNOSIS — T8143XD Infection following a procedure, organ and space surgical site, subsequent encounter: Secondary | ICD-10-CM | POA: Diagnosis not present

## 2023-12-20 DIAGNOSIS — I480 Paroxysmal atrial fibrillation: Secondary | ICD-10-CM | POA: Diagnosis not present

## 2023-12-22 ENCOUNTER — Encounter: Payer: Self-pay | Admitting: Internal Medicine

## 2023-12-22 DIAGNOSIS — T8143XD Infection following a procedure, organ and space surgical site, subsequent encounter: Secondary | ICD-10-CM | POA: Diagnosis not present

## 2023-12-22 DIAGNOSIS — L03115 Cellulitis of right lower limb: Secondary | ICD-10-CM | POA: Diagnosis not present

## 2023-12-22 DIAGNOSIS — T8144XD Sepsis following a procedure, subsequent encounter: Secondary | ICD-10-CM | POA: Diagnosis not present

## 2023-12-22 DIAGNOSIS — A419 Sepsis, unspecified organism: Secondary | ICD-10-CM | POA: Diagnosis not present

## 2023-12-22 DIAGNOSIS — M869 Osteomyelitis, unspecified: Secondary | ICD-10-CM | POA: Diagnosis not present

## 2023-12-22 DIAGNOSIS — I480 Paroxysmal atrial fibrillation: Secondary | ICD-10-CM | POA: Diagnosis not present

## 2023-12-23 DIAGNOSIS — T8143XD Infection following a procedure, organ and space surgical site, subsequent encounter: Secondary | ICD-10-CM | POA: Diagnosis not present

## 2023-12-23 DIAGNOSIS — A419 Sepsis, unspecified organism: Secondary | ICD-10-CM | POA: Diagnosis not present

## 2023-12-23 DIAGNOSIS — M869 Osteomyelitis, unspecified: Secondary | ICD-10-CM | POA: Diagnosis not present

## 2023-12-23 DIAGNOSIS — T8144XD Sepsis following a procedure, subsequent encounter: Secondary | ICD-10-CM | POA: Diagnosis not present

## 2023-12-23 DIAGNOSIS — I480 Paroxysmal atrial fibrillation: Secondary | ICD-10-CM | POA: Diagnosis not present

## 2023-12-23 DIAGNOSIS — L03115 Cellulitis of right lower limb: Secondary | ICD-10-CM | POA: Diagnosis not present

## 2023-12-23 NOTE — Progress Notes (Unsigned)
 No chief complaint on file.   HISTORY OF PRESENT ILLNESS:  12/23/23 ALL:  Levi [name] returns for follow up for neuropathy. He was last seen 11/2022 and felt neuropathy was stable on duloxetine  30mg  daily and pregabalin  75mg  up to three times daily. Since,    Now taking pregabalin  100mg  BID.   PCP is filling medications.   12/24/2022 ALL:  Levi [name] returns for follow up for neuropathy. He was last seen 05/2022 and reported discoloration of left foot. Pulses found with doppler. He was seen by PCP the next day and sent for ABI. He was referred to Dr Serene, vascular. Normal blood flow felt to be present. He was advised to wear compression stockings and follow up as needed. He had left foot surgery with Dr Elsa, ortho 11/07/2022. He had some trouble with venous status ulcers shortly afterwards but seems to be doing well, now.   Neuropathy is reportedly stable. He continues duloxetine  30mg  daily. Dr T writes pregabalin  75mg  up to three times daily. He usually only takes this twice daily. Gait is stable. He continues to use cane.    06/06/2022 ALL: Levi [name] returns for follow up for neuropathy. He continues duloxetine  30mg  daily. He reports neuropathy is stable. No significant worsening. He is tolerating duloxetine .   He had a fall in 12/2021 and referred to PT. He reports going a few times but did not feel it was helpful. He reports continued falls. He uses four prong cane. He has no feeling of bottoms of feet. He reports being diagnosed with Charcot foot. He has a walker but doesn't like to use it.   He tells me that he first noticed discoloration around the base of toes of left foot about 3 weeks ago. No falls or known injury to cause bruising. He denies pain.   12/04/2021 ALL: Levi [name] returns for follow up for neuropathy. He was last seen by Dr Buck 08/2021 and reported more falls and gait instability. He was tolerating duloxetine  60mg  daily but felt that 30mg  worked better and dose was decreased. He feels  that neuropathy seems stable. He has occasional burning. Neuropathy cream did not help much.   He reports that dizziness/lightheadedness has been a little worse over the past 6-8 weeks. Most of the time symptoms are consistent with standing from seated position. BP is normally lower. No syncope. He falls all the time. Mostly when not using He denies any significant injuries since last visit. He uses his four prong cane. Rarely uses walker. He wears compression stocking most of the time. He admits that he has not been drinking as much water  recently. Lasix  dose was increased to 40mg  end of May.   He did undergo robotic nissen fundoplication 11/01/2021. He was initially recovering well but developed more dysphagia about a week ago. He reports spitting up a little blood. Medrol  taper did not help much. He is being scheduled for UGI this week. He last noticed blood tinged sputum about 2 days ago.   He continues to follow closely with cardiology for CHF. He lives with his wife. He is able to drive.   04/09/2021 ALL:  Levi [name] returns for follow up. We increased gabapentin  to 300mg  TID and started compounded neuropathy cream at last visit 11/2020. He stopped gabapentin  as it was not effective. He reports that [facility] called him but never sent cream. He does not wish to resend orders. He reports neuropathy pain waxes and wanes. He continues to have regular falls. Fortunately, no injuries. He  uses a cane most all the time except for when he is at home. Falls have occurred when he is not using his cane. He has a walker but doesn't like to use it. PT has not been helpful. He continues to see [facility]. He is scheduled to return 05/09/2021 to discuss treatment for broken screw in his neck. He denies syncope since last being seen. Only one true syncopal event when seen by Dr Buck. He has had 1-2 near syncopal events, last event was 2 weeks ago. Symptoms were very short lived, a minute or so. He reports  blurry vision while driving. He felt that everything was slanted. He denies double vision. He states BP has been fluctuating from 80/60's to 150's/80's. He occasionally has dizziness with standing. He is on multiple cardiac medications. He is s/p failed watchman procedure. Last saw cardiology 10/20 who advised to continue Eliquis .   12/20/2020 ALL:  Levi [name] is a 78 y.o. male here today for follow up for bilateral leg pain. NCS/EMG testing shows chronic neuropathy. He was started on gabapentin  100mg  daily and advised to titrate to TID dosing. He has taken 300mg  daily at bedtime. He is uncertain if medicaiton as been helpful. He states that he was previously on gabapentin  300mg  TID and that seemed to work better. Burning pain continues to be fairly constant. Worse with activity and at night. He continues to see [facility] for nerve blocks and ESI. He is beings scheduled for an MRI of cervical spine in the next couple of week. He has severe arthritis of right foot. He was seen by podiatry and told it would take multiple surgeries over 5-6 years. He is on prednisone . He has had a total of 43 surgeries. He is currently being considered for insertion of Watchman.   HISTORY (copied from Dr Obie previous note)  [name] is a 78 year old right-handed gentleman with an underlying  Complex medical history of asthma, A. fib, hyperlipidemia, reflux disease with hiatal hernia, diverticulitis, degenerative disc disease, COPD, allergic rhinitis, history of agent orange exposure in the 70s, carpal tunnel syndrome, status post bilateral carpal tunnel release and ulnar release surgeries in January and February of this year, status post multiple surgeries including lumbar spine surgery, neck surgery, bilateral hip replacements, bilateral knee replacements, shoulder surgeries, who Presents for follow-up consultation of his neuropathy.  I saw him at the request of Dr. Colon on 10/21/2019, at which time the  patient reported a longstanding history of numbness and tingling and pain in both lower extremities.  Symptoms were more on the right.   He had extensive blood work on 10/21/2019.  A1c was 5.5, ANA was negative, CRP was less than 1, rheumatoid factor was less than 10, CK was 59, heavy metals in the blood was negative, CMP showed glucose of 115, BUN 27, creatinine 0.88, AST 19, ALT 15, TSH was mildly elevated at 5.33, multiple myeloma panel showed decreased in immunoglobulins with a pattern suggestive of hypogammaglobulinemia, RPR nonreactive, vitamin B12 low at 224, thiamine 206.2.  He was advised to seek consultation with hematology.  He was furthermore advised to follow-up with his primary care for B12 deficiency and abnormal TSH.   He had an EMG and nerve conduction velocity test through our office on 11/17/2019 and I reviewed the results:   Conclusion: This is an abnormal study.  There is electrodiagnostic evidence of mild bilateral lumbosacral radiculopathy, mainly involving bilateral L4, L5, S1 myotomes, there is no evidence of active process.  In addition, there is evidence of mild length dependent axonal sensorimotor polyneuropathy.   We called him with his test results.  He saw hematology in July 2021, hypogammaglobulinemia was not considered to be significant at the time.  He did not have any history of recurrent infections.  He was not felt to need a bone marrow biopsy.   Today, 09/20/2020: He reports ongoing issues with leg pain, right more than left.  It has not really progressed since last time but he has additional joint pains including both hips, sometimes he takes Flexeril  at bedtime.  He does report that his balance is off and he has had falls, as recent as 2 days ago.  He fell in the yard.  He has a walker but typically only uses his four-point cane.  He tries to hydrate with water .  He reports a recent brief episode of A. fib, he reports that he can tell when he is in A. fib.  He is due  for an eye exam.  He had a recent episode of dizziness and lightheadedness and had double vision which lasted about 10 minutes.  He has an eye examination yearly. He had an interim ER visit on 09/13/2020 for dizziness, lightheadedness.  I reviewed the emergency room records.  He had a head CT without contrast on 09/13/2020 and I reviewed the results: IMPRESSION: Mild atrophic changes without acute abnormality.  He was felt to have orthostasis.   He had recent right hand surgery on 08/07/2020 under Dr. Norleen Gavel.  He had right hand ligament reconstruction and tendon interposition.  Pain is improved and mobility is better since surgery he reports.  He is not taking any oxycodone  at this time.  Does take Flexeril  as needed, typically only once at bedtime.   REVIEW OF SYSTEMS: Out of a complete 14 system review of symptoms, the patient complains only of the following symptoms, chronic pain, numbness/tingling, and all other reviewed systems are negative.   ALLERGIES: Allergies  Allergen Reactions   Levaquin  [Levofloxacin ] Itching    Tolerated ciprofloxacin  in 2022.     HOME MEDICATIONS: Outpatient Medications Prior to Visit  Medication Sig Dispense Refill   acetaminophen  (TYLENOL ) 325 MG tablet Take 1-2 tablets (325-650 mg total) by mouth every 6 (six) hours as needed for mild pain (pain score 1-3) (or temp > 100.5).     albuterol  (VENTOLIN  HFA) 108 (90 Base) MCG/ACT inhaler INHALE 2 PUFFS BY MOUTH EVERY 4 HOURS AS NEEDED FOR WHEEZE OR FOR SHORTNESS OF BREATH 8.5 each 12   apixaban  (ELIQUIS ) 5 MG TABS tablet Take 1 tablet (5 mg total) by mouth 2 (two) times daily. 60 tablet 0   atorvastatin  (LIPITOR) 20 MG tablet Take 1 tablet (20 mg total) by mouth daily. 90 tablet 3   cyclobenzaprine  (FLEXERIL ) 10 MG tablet Take 1 tablet (10 mg total) by mouth 3 (three) times daily as needed for muscle spasms. 90 tablet 3   diltiazem  (CARDIZEM  CD) 180 MG 24 hr capsule Take 1 capsule (180 mg total) by mouth  daily. 90 capsule 3   DULoxetine  (CYMBALTA ) 30 MG capsule Take 1 capsule (30 mg total) by mouth daily. 30 capsule 3   feeding supplement (ENSURE PLUS HIGH PROTEIN) LIQD Take 237 mLs by mouth 2 (two) times daily between meals.     ferrous gluconate  (FERGON) 324 MG tablet Take 1 tablet (324 mg total) by mouth daily. 30 tablet 0   furosemide  (LASIX ) 40 MG tablet Take 0.5 tablets (20 mg total)  by mouth daily.     hydrocerin (EUCERIN) CREA Apply 1 Application topically daily. To left foot 454 g 0   ipratropium-albuterol  (DUONEB) 0.5-2.5 (3) MG/3ML SOLN 1 NEBULE EVERY 6 HOURS AS NEEDED. **J45.3** 270 mL 4   isosorbide  mononitrate (IMDUR ) 30 MG 24 hr tablet Take 1 tablet (30 mg total) by mouth daily. 90 tablet 3   melatonin 5 MG TABS Take 1 tablet (5 mg total) by mouth at bedtime as needed.     Multiple Vitamin (MULTIVITAMIN WITH MINERALS) TABS tablet Take 1 tablet by mouth daily.     Olodaterol HCl (STRIVERDI RESPIMAT) 2.5 MCG/ACT AERS INHALE 2 PUFFS BY MOUTH INTO THE LUNGS DAILY 4 g 11   ondansetron  (ZOFRAN -ODT) 8 MG disintegrating tablet Take 1 tablet (8 mg total) by mouth every 8 (eight) hours as needed for nausea. 20 tablet 3   oxyCODONE  (OXY IR/ROXICODONE ) 5 MG immediate release tablet Take 1-2 tablets (5-10 mg total) by mouth every 6 (six) hours as needed for severe pain (pain score 7-10) (5 mg for pain level 5-7 and 10 mg for pain > level 7). 30 tablet 0   pantoprazole  (PROTONIX ) 40 MG tablet Take 1 tablet (40 mg total) by mouth 2 (two) times daily before a meal. 60 tablet 0   polyethylene glycol (MIRALAX  / GLYCOLAX ) 17 g packet Take 17 g by mouth daily.     potassium chloride  SA (KLOR-CON  M) 20 MEQ tablet Take 2 tablets (40 mEq total) by mouth daily. 60 tablet 0   predniSONE  (DELTASONE ) 10 MG tablet Take 20 mg by mouth daily with breakfast.     pregabalin  (LYRICA ) 100 MG capsule Take 1 capsule (100 mg total) by mouth 2 (two) times daily. 60 capsule 0   tamsulosin  (FLOMAX ) 0.4 MG CAPS capsule Take  1 capsule (0.4 mg total) by mouth at bedtime. 30 capsule 0   No facility-administered medications prior to visit.     PAST MEDICAL HISTORY: Past Medical History:  Diagnosis Date   Agent orange exposure 1970's   takes Imdur ,Diltiazem , and Atacand  daily   Agent orange exposure    ALLERGIC RHINITIS    Allergy     seasonal allergies   Anemia    iron  deficiency   Arthritis    Asthma    uses inhaler and nebulizers   Atrial fibrillation (HCC)    Bruises easily    d/t prednisone  daily   Cataract    Cellulitis 03/22/2020   Chest wall pain 09/21/2019   L scapula, reported 4/27 21/     CHF (congestive heart failure) (HCC)    dx-on meds to treat   Chronic bronchitis (HCC)    used to get it q yr; last time ?2008 (04/28/2012)   Clotting disorder (HCC) 2019   On blood thinner when I bleed it is for a consideral amount of time. This is tegardless of size of wound.   COPD (chronic obstructive pulmonary disease) (HCC)    agent orange exposure   Degenerative disk disease    everywhere (04/28/2012)   Depression due to physical illness 12/15/2023   Diverticulitis    Dysrhythmia    afib   Elevated uric acid in blood    takes Allopurinol  daily   Emphysema of lung (HCC)    Enlarged prostate    but not on any meds   GERD (gastroesophageal reflux disease)    takes Protonix  daily   H/O hiatal hernia    Headache    History of colonoscopy  History of pneumonia    a. 2010   Hyperlipidemia    takes Lipitor daily; pt. states he takes a preventive   Hypothyroidism    Joint pain    Joint swelling    Leukocytosis    MSSA bacteremia 03/19/2023   Nausea and vomiting 11/12/2022   Neuromuscular disorder (HCC)    bilat legs, and bilat arms   Neuropathy    Osteoporosis 2012   PAF (paroxysmal atrial fibrillation) (HCC)    a. Dx 04/2012, CHA2DS2VASc = 1 (age);  b. 04/2012 Echo: EF 40-50%, mild MR.   Personal history of colonic adenomas 02/09/2013   Pneumonia    Sepsis (HCC) 03/18/2023    Shortness of breath dyspnea    Thyroid  disease    no taking meds-caused dizziness     PAST SURGICAL HISTORY: Past Surgical History:  Procedure Laterality Date   64 HOUR PH STUDY N/A 05/30/2021   Procedure: 24 HOUR PH STUDY;  Surgeon: San Sandor GAILS, DO;  Location: [facility];  Service: Gastroenterology;  Laterality: N/A;   AMPUTATION Right 11/14/2023   Procedure: AMPUTATION BELOW KNEE;  Surgeon: Harden Jerona GAILS, MD;  Location: [facility] OR;  Service: Orthopedics;  Laterality: Right;   AMPUTATION TOE Bilateral 08/13/2023   Procedure: LEFT SECOND TOE AMPUTATION AND RIGHT FIFTH TOE AMPUTATION;  Surgeon: Malvin Marsa FALCON, DPM;  Location: [facility] OR;  Service: Orthopedics/Podiatry;  Laterality: Bilateral;   ANTERIOR CERVICAL DECOMP/DISCECTOMY FUSION N/A 07/21/2012   Procedure: ANTERIOR CERVICAL DECOMPRESSION/DISCECTOMY FUSION 2 LEVELS;  Surgeon: Victory Gens, MD;  Location: [facility];  Service: Neurosurgery;  Laterality: N/A;  C4-5 C5-6 Anterior cervical decompression/diskectomy/fusion   ANTERIOR LAT LUMBAR FUSION N/A 04/07/2017   Procedure: Thoracic twelve-Lumbar one Anterolateral decompression/fusion;  Surgeon: Gens Victory, MD;  Location: [facility] OR;  Service: Neurosurgery;  Laterality: N/A;   ARTHRODESIS FOOT WITH WEIL OSTEOTOMY Left 11/07/2022   Procedure: LEFT THIRD METATARSOPHALANGEAL CAPSULOTOMY, WEIL OSTEOTOMY;  Surgeon: Elsa Lonni SAUNDERS, MD;  Location: [facility] OR;  Service: Orthopedics;  Laterality: Left;  LENGTH OF SURGERY: 60 MINUTES   BACK SURGERY     x 3   BONE EXOSTOSIS EXCISION Right 10/16/2023   Procedure: EXCISION, EXOSTOSIS;  Surgeon: Janit Thresa HERO, DPM;  Location: [facility];  Service: Orthopedics/Podiatry;  Laterality: Right;   CARDIAC CATHETERIZATION  11/2008   Dr. Jeffrie - 20% calcified non flow limiting left main, 50% EF apical hypokinesis   CARPAL TUNNEL RELEASE Bilateral    CERVICAL DISC ARTHROPLASTY N/A 04/07/2017   Procedure: Cervical six-seven Disc arthroplasty;  Surgeon:  Gens Victory, MD;  Location: [facility] OR;  Service: Neurosurgery;  Laterality: N/A;   CHEST TUBE INSERTION Left 04/11/2017   Procedure: CHEST TUBE INSERTION;  Surgeon: Fleeta Hanford Coy, MD;  Location[facility] OR;  Service: Thoracic;  Laterality: Left;   CHOLECYSTECTOMY     COLONOSCOPY  2016   CG-MAC-miralax (good)servere TICS/TA x 2   ESOPHAGEAL MANOMETRY N/A 05/30/2021   Procedure: ESOPHAGEAL MANOMETRY (EM);  Surgeon: San Sandor GAILS, DO;  Location: [facility];  Service: Gastroenterology;  Laterality: N/A;  ph impedence   ESOPHAGOGASTRODUODENOSCOPY     EYE SURGERY Bilateral 2011   steroidal encapsulation (04/28/2012)   FOOT SURGERY Right    x 2   JOINT REPLACEMENT     Many surgeries including 5 joit replacements   KNEE ARTHROSCOPY Bilateral    LATERAL / POSTERIOR COMBINED FUSION LUMBAR SPINE  2012   LEFT ATRIAL APPENDAGE OCCLUSION N/A 02/22/2021   Procedure: LEFT ATRIAL APPENDAGE OCCLUSION;  Surgeon: Cindie Ole DASEN,  MD;  [facility]: [facility];  Service: Cardiovascular;  Laterality: N/A;   NEUROPLASTY / TRANSPOSITION ULNAR NERVE AT ELBOW Bilateral    POLYPECTOMY  2016   severe TICS/TA x 2   POSTERIOR CERVICAL LAMINECTOMY WITH MET- RX Right 06/11/2021   Procedure: Right Cervical six-seven  Laminectomy and foraminotomy with metrex;  Surgeon: Colon Shove, MD;  Location: [facility] OR;  Service: Neurosurgery;  Laterality: Right;   REFRACTIVE SURGERY Left 07/07/2023   laser surgery but still has floaters   REVERSE SHOULDER ARTHROPLASTY  04/28/2012   Procedure: REVERSE SHOULDER ARTHROPLASTY;  Surgeon: Eva Elsie Herring, MD;  Location: [facility] OR;  Service: Orthopedics;  Laterality: Left;  Left shouder reverse total shoulder arthroplasty   SHOULDER SURGERY     SPINE SURGERY     I have had 7 spinal surgeries   TEE WITHOUT CARDIOVERSION N/A 02/22/2021   Procedure: TRANSESOPHAGEAL ECHOCARDIOGRAM (TEE);  Surgeon: Cindie Ole DASEN, MD;  Location: [facility] INVASIVE CV LAB;  Service: Cardiovascular;   Laterality: N/A;   TENDON REPAIR Right 08/07/2020   Procedure: RIGHT HAND LIGAMENT RECONSTRUCTION AND TENDON INTERPOSITION;  Surgeon: Yvone Rush, MD;  Location: [facility];  Service: Orthopedics;  Laterality: Right;   TONSILLECTOMY AND ADENOIDECTOMY  1954   TOTAL HIP ARTHROPLASTY Left 2011   left (04/28/2012)   TOTAL HIP ARTHROPLASTY Right 07/31/2015   Procedure: TOTAL HIP ARTHROPLASTY ANTERIOR APPROACH;  Surgeon: Rush Yvone, MD;  Location: [facility];  Service: Orthopedics;  Laterality: Right;   TOTAL KNEE ARTHROPLASTY  01/10/2012   Procedure: TOTAL KNEE ARTHROPLASTY;  Surgeon: Rush LITTIE Yvone, MD;  Location: [facility] OR;  Service: Orthopedics;;  left total knee arthroplasty   TOTAL KNEE ARTHROPLASTY Right 06/17/2014   Procedure: TOTAL KNEE ARTHROPLASTY;  Surgeon: Rush LITTIE Yvone, MD;  Location: [facility];  Service: Orthopedics;  Laterality: Right;   toupet fundolplication  11/01/2021   TRANSESOPHAGEAL ECHOCARDIOGRAM (CATH LAB) N/A 03/24/2023   Procedure: TRANSESOPHAGEAL ECHOCARDIOGRAM;  Surgeon: Mona Vinie BROCKS, MD;  Location: [facility];  Service: Cardiovascular;  Laterality: N/A;     FAMILY HISTORY: Family History  Problem Relation Age of Onset   Diabetes Mother    Heart disease Father        Died 18, MI   Cancer Father    Hyperlipidemia Father    Stroke Father    Prostate cancer Brother    Colon polyps Brother    Diabetes Brother    Colon polyps Brother    Asthma Brother    COPD Brother    Colon cancer Brother        Dx age 41   Cancer Brother    Colon cancer Other    Esophageal cancer Neg Hx    Rectal cancer Neg Hx    Stomach cancer Neg Hx    Pancreatic cancer Neg Hx    Kidney disease Neg Hx    Liver disease Neg Hx    Neuropathy Neg Hx      SOCIAL HISTORY: Social History   Socioeconomic History   Marital status: Married    Spouse name: [name]   Number of children: 1   Years of education: 16   Highest education level: Bachelor's degree (e.g., BA,  AB, BS)  Occupational History   Occupation: Retired    Comment: Nurse, 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 Daughter lives two miles away. 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 Enjoys being active and working.      Tobacco use, amount per day now: None   Past tobacco use, amount per day: None   How many years did you use tobacco:   Alcohol use (drinks per week): None   Diet:   Do you drink/eat things with caffeine : Coffee   Marital status:  Married                                What year were you married? 1973   Do you live in a house, apartment, assisted living, condo, trailer, etc.? House    Is it one or more stories? 3 stories   How many persons live in your home? 2   Do you have pets in your home?( please list) 3 [employer]    Highest Level of education completed? Engineer, maintenance (IT).   Current or past profession: Police    Do you exercise?    Yes                              Type and how often? 5 times weekly   Do you have a living will? Yes   Do you have a DNR form?     Yes                              If not, do you want to discuss one?   Do you have signed POA/HPOA forms?    Yes                    If so, please bring to you appointment      Do you have any difficulty bathing or dressing yourself?    Do you have any difficulty preparing food or eating? No   Do you have any difficulty managing your medications? No   Do you have any difficulty managing your finances? No   Do you have any difficulty affording your medications? No   Social Drivers of Manufacturing engineer Strain: Low Risk  (10/23/2023)   Overall Financial Resource Strain (CARDIA)    Difficulty of Paying Living Expenses: Not hard at all  Food Insecurity: No Food Insecurity (11/08/2023)   Hunger Vital Sign    Worried About Running Out of Food in the Last Year: Never true    Ran Out of Food in the Last Year: Never true  Transportation Needs: No Transportation Needs (11/08/2023)   PRAPARE - Administrator, Civil Service (Medical): No    Lack of Transportation (Non-Medical): No  Physical Activity: Sufficiently Active (10/23/2023)   Exercise Vital Sign    Days of Exercise per Week: 5 days    Minutes of Exercise per Session: 60 min  Stress: No Stress Concern Present (10/23/2023)   [employer] of Occupational Health - Occupational Stress Questionnaire    Feeling of Stress : Not at all  Social Connections: Socially Integrated (  11/08/2023)   Social Connection and Isolation Panel    Frequency of Communication with Friends and Family: More than three times a week    Frequency of Social Gatherings with Friends and Family: More than three times a week    Attends Religious Services: More than 4 times per year    Active Member of [employer] or Organizations: Yes    Attends Banker Meetings: More than 4 times per year    Marital Status: Married  Catering manager Violence: Not At Risk (11/08/2023)   Humiliation, Afraid, Rape, and Kick questionnaire    Fear of Current or Ex-Partner: No    Emotionally Abused: No    Physically Abused: No    Sexually Abused: No     PHYSICAL EXAM  There were no vitals filed for this visit.    There is no height or weight on file to calculate BMI.   Generalized: Well developed, in no acute distress  Cardiology: normal rate and rhythm, no murmur auscultated  Respiratory: clear to auscultation bilaterally    Neurological examination  Mentation: Alert oriented to time, place, history taking. Follows all commands speech  and language fluent Cranial nerve II-XII: Pupils were equal round reactive to light. Extraocular movements were full, visual field were full on confrontational test. Facial sensation and strength were normal. Head turning and shoulder shrug  were normal and symmetric. Motor: The motor testing reveals 4-4+ over 5 strength of all 4 extremities with exception of R lower hip flexion and dorsiflexion 4/5.  Sensory: Sensory testing is decreased to soft and pinprick testing of bilateral lower extremities from upper thigh to just below knee, absent from bilateral knees to bottom of feet.  Coordination: Cerebellar testing reveals good finger-nose-finger, unable to perform heel to shin bilaterally Gait and station: He pushes with some difficulty to standing position. Gait is slow and cautious. Stable with cane. Tandem not attempted.     DIAGNOSTIC DATA (LABS, IMAGING, TESTING) - I reviewed patient records, labs, notes, testing and imaging myself where available.  Lab Results  Component Value Date   WBC 9.9 12/15/2023   HGB 11.9 (L) 12/15/2023   HCT 38.0 12/15/2023   MCV 93 12/15/2023   PLT 147 (L) 12/15/2023      Component Value Date/Time   NA 139 12/15/2023 1405   K 4.4 12/15/2023 1405   CL 105 12/15/2023 1405   CO2 20 12/15/2023 1405   GLUCOSE 106 (H) 12/15/2023 1405   GLUCOSE 85 12/04/2023 0615   BUN 15 12/15/2023 1405   CREATININE 0.71 (L) 12/15/2023 1405   CREATININE 0.85 05/27/2023 1435   CALCIUM  9.2 12/15/2023 1405   PROT 5.7 (L) 12/15/2023 1405   ALBUMIN 3.7 (L) 12/15/2023 1405   AST 17 12/15/2023 1405   AST 19 02/28/2020 1440   ALT 16 12/15/2023 1405   ALT 15 02/28/2020 1440   ALKPHOS 112 12/15/2023 1405   BILITOT 0.5 12/15/2023 1405   BILITOT 0.6 02/28/2020 1440   GFRNONAA >60 12/04/2023 0615   GFRNONAA >60 02/28/2020 1440   GFRAA >60 02/28/2020 1440   Lab Results  Component Value Date   CHOL 208 (H) 07/05/2022   HDL 112 07/05/2022   LDLCALC 82 07/05/2022   TRIG 65  07/05/2022   CHOLHDL 1.9 07/05/2022   Lab Results  Component Value Date   HGBA1C 5.8 (H) 07/05/2022   Lab Results  Component Value Date   VITAMINB12 202 06/17/2023   Lab Results  Component Value Date   TSH  1.70 07/05/2022        No data to display               No data to display           ASSESSMENT AND PLAN  78 y.o. 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), Diverticulitis, Dysrhythmia, Elevated uric acid in blood, Emphysema of lung (HCC), Enlarged prostate, GERD (gastroesophageal reflux disease), H/O hiatal hernia, Headache, History of colonoscopy, History of pneumonia, Hyperlipidemia, Hypothyroidism, Joint pain, Joint swelling, Leukocytosis, MSSA bacteremia (03/19/2023), Nausea and vomiting (11/12/2022), Neuromuscular disorder (HCC), Neuropathy, Osteoporosis (2012), PAF (paroxysmal atrial fibrillation) (HCC), Personal history of colonic adenomas (02/09/2013), Pneumonia, Sepsis (HCC) (03/18/2023), Shortness of breath dyspnea, and Thyroid  disease. here with    No diagnosis found.  Levi [name] feels that neuropathy is stable. We will continue duloxetine  30mg  daily. Labs have been stable. He will continue pregabalin  per Dr Nathalie recommendations. I have encouraged him to wear compression stockings, make position changes slowly and focus on adequate hydration. Fall precautions reviewed. He was encouraged to use cane at all times. He will continue close follow up with PCP, cardiology and neurosurgery. I will have him return for follow up with me in 6  months. He verbalizes understanding and agreement with this plan.    No orders of the defined types were placed in this encounter.     No orders of the defined types were placed in this encounter.     Greig Forbes, MSN, FNP-C 12/23/2023, 4:09 PM  [facility] [address] [place], [place] [address] [phone]

## 2023-12-23 NOTE — Patient Instructions (Signed)
 Below is our plan:  We will ***  Please make sure you are staying well hydrated. I recommend 50-60 ounces daily. Well balanced diet and regular exercise encouraged. Consistent sleep schedule with 6-8 hours recommended.   Please continue follow up with care team as directed.   Follow up with *** in ***  You may receive a survey regarding today's visit. I encourage you to leave honest feed back as I do use this information to improve patient care. Thank you for seeing me today!

## 2023-12-24 ENCOUNTER — Telehealth: Payer: Self-pay | Admitting: Internal Medicine

## 2023-12-24 ENCOUNTER — Ambulatory Visit (INDEPENDENT_AMBULATORY_CARE_PROVIDER_SITE_OTHER): Payer: Medicare Other | Admitting: Family Medicine

## 2023-12-24 ENCOUNTER — Encounter: Payer: Self-pay | Admitting: Family Medicine

## 2023-12-24 ENCOUNTER — Ambulatory Visit (INDEPENDENT_AMBULATORY_CARE_PROVIDER_SITE_OTHER): Admitting: Family

## 2023-12-24 VITALS — BP 115/63 | HR 60 | Ht 74.0 in | Wt 184.0 lb

## 2023-12-24 DIAGNOSIS — R296 Repeated falls: Secondary | ICD-10-CM

## 2023-12-24 DIAGNOSIS — Z89511 Acquired absence of right leg below knee: Secondary | ICD-10-CM

## 2023-12-24 DIAGNOSIS — R531 Weakness: Secondary | ICD-10-CM

## 2023-12-24 DIAGNOSIS — R2689 Other abnormalities of gait and mobility: Secondary | ICD-10-CM | POA: Diagnosis not present

## 2023-12-24 DIAGNOSIS — R413 Other amnesia: Secondary | ICD-10-CM

## 2023-12-24 DIAGNOSIS — Z899 Acquired absence of limb, unspecified: Secondary | ICD-10-CM

## 2023-12-24 DIAGNOSIS — I739 Peripheral vascular disease, unspecified: Secondary | ICD-10-CM | POA: Diagnosis not present

## 2023-12-24 DIAGNOSIS — G629 Polyneuropathy, unspecified: Secondary | ICD-10-CM | POA: Diagnosis not present

## 2023-12-24 NOTE — Progress Notes (Signed)
 Post-Op Visit Note   Patient: Levi [name]           Date of Birth: [birth year]           MRN: [id number] Visit Date: 12/24/2023 PCP: Curtis Debby PARAS, MD  Chief Complaint:  Chief Complaint  Patient presents with   Right Leg - Routine Post Op    Right BKA    HPI:  HPI The patient is a 78 year old gentleman who is seen status post right below-knee amputation for incision check today.  Ortho Exam On examination right residual limb this is healing well there are two remaining open areas laterally, about 1 cm x 5 as well as mm both in size with serosanguineous drainage, scant.  There is mild surrounding erythema no warmth no purulence or odor.  Visit Diagnoses: No diagnosis found.  Plan: Reassurance provided.  There is no sign of cellulitis incision healing.   Follow-Up Instructions: No follow-ups on file.   Imaging: No results found.  Orders:  No orders of the defined types were placed in this encounter.  No orders of the defined types were placed in this encounter.    PMFS History: Patient Active Problem List   Diagnosis Date Noted   Depression due to physical illness 12/15/2023   Hematochezia 07/25/2023   Closed fracture of ramus of right pubis (HCC) 04/07/2023   S/P PICC central line placement 03/31/2023   Chronic superficial venous thrombosis of both lower extremities 11/18/2022   Primary osteoarthritis, right shoulder 07/05/2022   Shingles 05/16/2022   Vertigo 04/04/2022   BPH (benign prostatic hyperplasia) 02/21/2022   Annual physical exam 01/22/2022   History of right below knee amputation (HCC) 01/22/2022   Atypical chest pain    Chronic anticoagulation 03/15/2021   Peripheral neuropathy 02/22/2021   Chronic systolic heart failure (HCC) 02/22/2021   Essential (primary) hypertension 11/15/2020   Arthritis of carpometacarpal (CMC) joint of right thumb 08/07/2020   COPD (chronic obstructive pulmonary disease) (HCC) 03/21/2020   GERD  (gastroesophageal reflux disease) status post Nissen fundoplication 12/03/2017   Lumbar stenosis status post T12-S1 fusion 04/07/2017   History of total right hip arthroplasty 07/31/2015   Primary osteoarthritis of right knee 06/17/2014   Hyperlipidemia    History of colonic polyps 02/09/2013   Paroxysmal atrial fibrillation (HCC) 05/11/2012   Osteoarthritis of left knee 01/10/2012   Seasonal and perennial allergic rhinitis 07/23/2010   Asthma, moderate persistent 03/06/2009   Past Medical History:  Diagnosis Date   Agent orange exposure 1970's   takes Imdur ,Diltiazem , and Atacand  daily   Agent orange exposure    ALLERGIC RHINITIS    Allergy     seasonal allergies   Anemia    iron  deficiency   Arthritis    Asthma    uses inhaler and nebulizers   Atrial fibrillation (HCC)    Bruises easily    d/t prednisone  daily   Cataract    Cellulitis 03/22/2020   Chest wall pain 09/21/2019   L scapula, reported 4/27 21/     CHF (congestive heart failure) (HCC)    dx-on meds to treat   Chronic bronchitis (HCC)    used to get it q yr; last time ?2008 (04/28/2012)   Clotting disorder (HCC) 2019   On blood thinner when I bleed it is for a consideral amount of time. This is tegardless of size of wound.   COPD (chronic obstructive pulmonary disease) (HCC)    agent orange exposure   Degenerative disk disease  everywhere (04/28/2012)   Depression due to physical illness 12/15/2023   Diverticulitis    Dysrhythmia    afib   Elevated uric acid in blood    takes Allopurinol  daily   Emphysema of lung (HCC)    Enlarged prostate    but not on any meds   GERD (gastroesophageal reflux disease)    takes Protonix  daily   H/O hiatal hernia    Headache    History of colonoscopy    History of pneumonia    a. 2010   Hyperlipidemia    takes Lipitor daily; pt. states he takes a preventive   Hypothyroidism    Joint pain    Joint swelling    Leukocytosis    MSSA bacteremia 03/19/2023    Nausea and vomiting 11/12/2022   Neuromuscular disorder (HCC)    bilat legs, and bilat arms   Neuropathy    Osteoporosis 2012   PAF (paroxysmal atrial fibrillation) (HCC)    a. Dx 04/2012, CHA2DS2VASc = 1 (age);  b. 04/2012 Echo: EF 40-50%, mild MR.   Personal history of colonic adenomas 02/09/2013   Pneumonia    Sepsis (HCC) 03/18/2023   Shortness of breath dyspnea    Thyroid  disease    no taking meds-caused dizziness    Family History  Problem Relation Age of Onset   Diabetes Mother    Heart disease Father        Died 38, MI   Cancer Father    Hyperlipidemia Father    Stroke Father    Prostate cancer Brother    Colon polyps Brother    Diabetes Brother    Colon polyps Brother    Asthma Brother    COPD Brother    Colon cancer Brother        Dx age 63   Cancer Brother    Colon cancer Other    Esophageal cancer Neg Hx    Rectal cancer Neg Hx    Stomach cancer Neg Hx    Pancreatic cancer Neg Hx    Kidney disease Neg Hx    Liver disease Neg Hx    Neuropathy Neg Hx     Past Surgical History:  Procedure Laterality Date   56 HOUR PH STUDY N/A 05/30/2021   Procedure: 24 HOUR PH STUDY;  Surgeon: San Sandor GAILS, DO;  Location: [facility];  Service: Gastroenterology;  Laterality: N/A;   AMPUTATION Right 11/14/2023   Procedure: AMPUTATION BELOW KNEE;  Surgeon: Harden Jerona GAILS, MD;  Location: [facility] OR;  Service: Orthopedics;  Laterality: Right;   AMPUTATION TOE Bilateral 08/13/2023   Procedure: LEFT SECOND TOE AMPUTATION AND RIGHT FIFTH TOE AMPUTATION;  Surgeon: Malvin Marsa FALCON, DPM;  Location: [facility] OR;  Service: Orthopedics/Podiatry;  Laterality: Bilateral;   ANTERIOR CERVICAL DECOMP/DISCECTOMY FUSION N/A 07/21/2012   Procedure: ANTERIOR CERVICAL DECOMPRESSION/DISCECTOMY FUSION 2 LEVELS;  Surgeon: Victory Gens, MD;  Location: [facility];  Service: Neurosurgery;  Laterality: N/A;  C4-5 C5-6 Anterior cervical decompression/diskectomy/fusion   ANTERIOR LAT LUMBAR FUSION  N/A 04/07/2017   Procedure: Thoracic twelve-Lumbar one Anterolateral decompression/fusion;  Surgeon: Gens Victory, MD;  Location: [facility] OR;  Service: Neurosurgery;  Laterality: N/A;   ARTHRODESIS FOOT WITH WEIL OSTEOTOMY Left 11/07/2022   Procedure: LEFT THIRD METATARSOPHALANGEAL CAPSULOTOMY, WEIL OSTEOTOMY;  Surgeon: Elsa Lonni SAUNDERS, MD;  Location[facility] OR;  Service: Orthopedics;  Laterality: Left;  LENGTH OF SURGERY: 60 MINUTES   BACK SURGERY     x 3   BELOW KNEE LEG AMPUTATION Right  June 2025   BONE EXOSTOSIS EXCISION Right 10/16/2023   Procedure: EXCISION, EXOSTOSIS;  Surgeon: Janit Thresa HERO, DPM;  Location: [facility];  Service: Orthopedics/Podiatry;  Laterality: Right;   CARDIAC CATHETERIZATION  11/2008   Dr. Jeffrie - 20% calcified non flow limiting left main, 50% EF apical hypokinesis   CARPAL TUNNEL RELEASE Bilateral    CERVICAL DISC ARTHROPLASTY N/A 04/07/2017   Procedure: Cervical six-seven Disc arthroplasty;  Surgeon: Colon Shove, MD;  Location: [facility] OR;  Service: Neurosurgery;  Laterality: N/A;   CHEST TUBE INSERTION Left 04/11/2017   Procedure: CHEST TUBE INSERTION;  Surgeon: Fleeta Hanford Coy, MD;  Location: [facility] OR;  Service: Thoracic;  Laterality: Left;   CHOLECYSTECTOMY     COLONOSCOPY  2016   CG-MAC-miralax (good)servere TICS/TA x 2   ESOPHAGEAL MANOMETRY N/A 05/30/2021   Procedure: ESOPHAGEAL MANOMETRY (EM);  Surgeon: San Sandor GAILS, DO;  Location: [facility];  Service: Gastroenterology;  Laterality: N/A;  ph impedence   ESOPHAGOGASTRODUODENOSCOPY     EYE SURGERY Bilateral 2011   steroidal encapsulation (04/28/2012)   FOOT SURGERY Right    x 2   JOINT REPLACEMENT     Many surgeries including 5 joit replacements   KNEE ARTHROSCOPY Bilateral    LATERAL / POSTERIOR COMBINED FUSION LUMBAR SPINE  2012   LEFT ATRIAL APPENDAGE OCCLUSION N/A 02/22/2021   Procedure: LEFT ATRIAL APPENDAGE OCCLUSION;  Surgeon: Cindie Ole DASEN, MD;  [facility];  Service:  Cardiovascular;  Laterality: N/A;   NEUROPLASTY / TRANSPOSITION ULNAR NERVE AT ELBOW Bilateral    POLYPECTOMY  2016   severe TICS/TA x 2   POSTERIOR CERVICAL LAMINECTOMY WITH MET- RX Right 06/11/2021   Procedure: Right Cervical six-seven  Laminectomy and foraminotomy with metrex;  Surgeon: Colon Shove, MD;  Location: [facility];  Service: Neurosurgery;  Laterality: Right;   REFRACTIVE SURGERY Left 07/07/2023   laser surgery but still has floaters   REVERSE SHOULDER ARTHROPLASTY  04/28/2012   Procedure: REVERSE SHOULDER ARTHROPLASTY;  Surgeon: Eva Elsie Herring, MD;  Location: [facility] OR;  Service: Orthopedics;  Laterality: Left;  Left shouder reverse total shoulder arthroplasty   SHOULDER SURGERY     SPINE SURGERY     I have had 7 spinal surgeries   TEE WITHOUT CARDIOVERSION N/A 02/22/2021   Procedure: TRANSESOPHAGEAL ECHOCARDIOGRAM (TEE);  Surgeon: Cindie Ole DASEN, MD;  [facility]VAS[facility] CV LAB;  Service: Cardiovascular;  Laterality: N/A;   TENDON REPAIR Right 08/07/2020   Procedure: RIGHT HAND LIGAMENT RECONSTRUCTION AND TENDON INTERPOSITION;  Surgeon: Yvone Rush, MD;  Location: [facility];  Service: Orthopedics;  Laterality: Right;   TONSILLECTOMY AND ADENOIDECTOMY  1954   TOTAL HIP ARTHROPLASTY Left 2011   left (04/28/2012)   TOTAL HIP ARTHROPLASTY Right 07/31/2015   Procedure: TOTAL HIP ARTHROPLASTY ANTERIOR APPROACH;  Surgeon: Rush Yvone, MD;  Location: [facility] OR;  Service: Orthopedics;  Laterality: Right;   TOTAL KNEE ARTHROPLASTY  01/10/2012   Procedure: TOTAL KNEE ARTHROPLASTY;  Surgeon: Rush LITTIE Yvone, MD;  Location: [facility] OR;  Service: Orthopedics;;  left total knee arthroplasty   TOTAL KNEE ARTHROPLASTY Right 06/17/2014   Procedure: TOTAL KNEE ARTHROPLASTY;  Surgeon: Rush LITTIE Yvone, MD;  Location: [facility] OR;  Service: Orthopedics;  Laterality: Right;   toupet fundolplication  11/01/2021   TRANSESOPHAGEAL ECHOCARDIOGRAM (CATH LAB) N/A 03/24/2023   Procedure:  TRANSESOPHAGEAL ECHOCARDIOGRAM;  Surgeon: Mona Vinie BROCKS, MD;  Location: [facility];  Service: Cardiovascular;  Laterality: N/A;   Social History  Occupational History   Occupation: Retired    Comment: Nurse, 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

## 2023-12-24 NOTE — Telephone Encounter (Unsigned)
 Copied from CRM [id number]. Topic: Clinical - Medication Refill >> Dec 24, 2023  1:07 PM Joesph PARAS wrote: Medication: prednisone    Has the patient contacted their pharmacy? Yes - States not recieved  This is the patient's preferred pharmacy:  [id number] - [place], [place] - [address] [place] [address] Phone: [phone] Fax: [phone] Is this the correct pharmacy for this prescription? Yes If no, delete pharmacy and type the correct one.   Has the prescription been filled recently? No  Is the patient out of the medication? Yes  Has the patient been seen for an appointment in the last year OR does the patient have an upcoming appointment? Yes  Can we respond through MyChart? Yes  Agent: Please be advised that Rx refills may take up to 3 business days. We ask that you follow-up with your pharmacy.

## 2023-12-25 ENCOUNTER — Ambulatory Visit: Admitting: Podiatry

## 2023-12-25 DIAGNOSIS — M869 Osteomyelitis, unspecified: Secondary | ICD-10-CM | POA: Diagnosis not present

## 2023-12-25 DIAGNOSIS — T8144XD Sepsis following a procedure, subsequent encounter: Secondary | ICD-10-CM | POA: Diagnosis not present

## 2023-12-25 DIAGNOSIS — I480 Paroxysmal atrial fibrillation: Secondary | ICD-10-CM | POA: Diagnosis not present

## 2023-12-25 DIAGNOSIS — T8143XD Infection following a procedure, organ and space surgical site, subsequent encounter: Secondary | ICD-10-CM | POA: Diagnosis not present

## 2023-12-25 DIAGNOSIS — L03115 Cellulitis of right lower limb: Secondary | ICD-10-CM | POA: Diagnosis not present

## 2023-12-25 DIAGNOSIS — A419 Sepsis, unspecified organism: Secondary | ICD-10-CM | POA: Diagnosis not present

## 2023-12-25 MED ORDER — PREDNISONE 5 MG PO TABS
ORAL_TABLET | ORAL | 5 refills | Status: AC
Start: 2023-12-25 — End: ?

## 2023-12-25 NOTE — Telephone Encounter (Signed)
 I have sent prednisone  script to take 3 x 5 mg tabs daily.

## 2023-12-25 NOTE — Telephone Encounter (Signed)
 Looks like he got a refill today from his pulmonologist.

## 2023-12-26 ENCOUNTER — Other Ambulatory Visit: Payer: Self-pay

## 2023-12-26 ENCOUNTER — Emergency Department (HOSPITAL_COMMUNITY)

## 2023-12-26 ENCOUNTER — Telehealth: Payer: Self-pay | Admitting: Sports Medicine

## 2023-12-26 ENCOUNTER — Inpatient Hospital Stay (HOSPITAL_COMMUNITY)
Admission: EM | Admit: 2023-12-26 | Discharge: 2023-12-29 | DRG: 862 | Disposition: A | Discharge status: Home / Self Care | Attending: Student | Admitting: Student

## 2023-12-26 ENCOUNTER — Encounter (HOSPITAL_COMMUNITY): Payer: Self-pay

## 2023-12-26 DIAGNOSIS — J189 Pneumonia, unspecified organism: Principal | ICD-10-CM | POA: Insufficient documentation

## 2023-12-26 DIAGNOSIS — J4540 Moderate persistent asthma, uncomplicated: Secondary | ICD-10-CM | POA: Diagnosis not present

## 2023-12-26 DIAGNOSIS — M7989 Other specified soft tissue disorders: Secondary | ICD-10-CM

## 2023-12-26 DIAGNOSIS — Z823 Family history of stroke: Secondary | ICD-10-CM

## 2023-12-26 DIAGNOSIS — F0631 Mood disorder due to known physiological condition with depressive features: Secondary | ICD-10-CM | POA: Diagnosis not present

## 2023-12-26 DIAGNOSIS — Z833 Family history of diabetes mellitus: Secondary | ICD-10-CM | POA: Diagnosis not present

## 2023-12-26 DIAGNOSIS — T8743 Infection of amputation stump, right lower extremity: Secondary | ICD-10-CM | POA: Diagnosis present

## 2023-12-26 DIAGNOSIS — S80812A Abrasion, left lower leg, initial encounter: Secondary | ICD-10-CM | POA: Diagnosis present

## 2023-12-26 DIAGNOSIS — J440 Chronic obstructive pulmonary disease with acute lower respiratory infection: Secondary | ICD-10-CM | POA: Diagnosis not present

## 2023-12-26 DIAGNOSIS — E039 Hypothyroidism, unspecified: Secondary | ICD-10-CM | POA: Diagnosis not present

## 2023-12-26 DIAGNOSIS — K219 Gastro-esophageal reflux disease without esophagitis: Secondary | ICD-10-CM | POA: Diagnosis not present

## 2023-12-26 DIAGNOSIS — T8149XA Infection following a procedure, other surgical site, initial encounter: Secondary | ICD-10-CM | POA: Diagnosis not present

## 2023-12-26 DIAGNOSIS — L03115 Cellulitis of right lower limb: Secondary | ICD-10-CM | POA: Diagnosis present

## 2023-12-26 DIAGNOSIS — Z7901 Long term (current) use of anticoagulants: Secondary | ICD-10-CM

## 2023-12-26 DIAGNOSIS — E7800 Pure hypercholesterolemia, unspecified: Secondary | ICD-10-CM | POA: Diagnosis not present

## 2023-12-26 DIAGNOSIS — J9811 Atelectasis: Secondary | ICD-10-CM | POA: Diagnosis not present

## 2023-12-26 DIAGNOSIS — R509 Fever, unspecified: Secondary | ICD-10-CM | POA: Diagnosis not present

## 2023-12-26 DIAGNOSIS — J438 Other emphysema: Secondary | ICD-10-CM

## 2023-12-26 DIAGNOSIS — Z96653 Presence of artificial knee joint, bilateral: Secondary | ICD-10-CM | POA: Diagnosis not present

## 2023-12-26 DIAGNOSIS — R9389 Abnormal findings on diagnostic imaging of other specified body structures: Secondary | ICD-10-CM | POA: Diagnosis not present

## 2023-12-26 DIAGNOSIS — Z7952 Long term (current) use of systemic steroids: Secondary | ICD-10-CM

## 2023-12-26 DIAGNOSIS — I82813 Embolism and thrombosis of superficial veins of lower extremities, bilateral: Secondary | ICD-10-CM | POA: Diagnosis not present

## 2023-12-26 DIAGNOSIS — G6289 Other specified polyneuropathies: Secondary | ICD-10-CM | POA: Diagnosis not present

## 2023-12-26 DIAGNOSIS — N401 Enlarged prostate with lower urinary tract symptoms: Secondary | ICD-10-CM

## 2023-12-26 DIAGNOSIS — J439 Emphysema, unspecified: Secondary | ICD-10-CM | POA: Diagnosis present

## 2023-12-26 DIAGNOSIS — I10 Essential (primary) hypertension: Secondary | ICD-10-CM | POA: Diagnosis not present

## 2023-12-26 DIAGNOSIS — Z1152 Encounter for screening for COVID-19: Secondary | ICD-10-CM | POA: Diagnosis not present

## 2023-12-26 DIAGNOSIS — I5042 Chronic combined systolic (congestive) and diastolic (congestive) heart failure: Secondary | ICD-10-CM | POA: Diagnosis not present

## 2023-12-26 DIAGNOSIS — Z96643 Presence of artificial hip joint, bilateral: Secondary | ICD-10-CM | POA: Diagnosis not present

## 2023-12-26 DIAGNOSIS — F32A Depression, unspecified: Secondary | ICD-10-CM | POA: Diagnosis present

## 2023-12-26 DIAGNOSIS — I480 Paroxysmal atrial fibrillation: Secondary | ICD-10-CM | POA: Diagnosis present

## 2023-12-26 DIAGNOSIS — Y95 Nosocomial condition: Secondary | ICD-10-CM | POA: Diagnosis present

## 2023-12-26 DIAGNOSIS — E1142 Type 2 diabetes mellitus with diabetic polyneuropathy: Secondary | ICD-10-CM | POA: Diagnosis present

## 2023-12-26 DIAGNOSIS — I5022 Chronic systolic (congestive) heart failure: Secondary | ICD-10-CM | POA: Diagnosis present

## 2023-12-26 DIAGNOSIS — Z96612 Presence of left artificial shoulder joint: Secondary | ICD-10-CM | POA: Diagnosis present

## 2023-12-26 DIAGNOSIS — L0390 Cellulitis, unspecified: Secondary | ICD-10-CM | POA: Diagnosis present

## 2023-12-26 DIAGNOSIS — E785 Hyperlipidemia, unspecified: Secondary | ICD-10-CM | POA: Diagnosis not present

## 2023-12-26 DIAGNOSIS — E876 Hypokalemia: Secondary | ICD-10-CM | POA: Diagnosis not present

## 2023-12-26 DIAGNOSIS — I110 Hypertensive heart disease with heart failure: Secondary | ICD-10-CM | POA: Diagnosis present

## 2023-12-26 DIAGNOSIS — N400 Enlarged prostate without lower urinary tract symptoms: Secondary | ICD-10-CM | POA: Diagnosis present

## 2023-12-26 DIAGNOSIS — Z800 Family history of malignant neoplasm of digestive organs: Secondary | ICD-10-CM

## 2023-12-26 DIAGNOSIS — N281 Cyst of kidney, acquired: Secondary | ICD-10-CM | POA: Diagnosis not present

## 2023-12-26 DIAGNOSIS — M810 Age-related osteoporosis without current pathological fracture: Secondary | ICD-10-CM | POA: Diagnosis present

## 2023-12-26 DIAGNOSIS — J168 Pneumonia due to other specified infectious organisms: Secondary | ICD-10-CM | POA: Diagnosis not present

## 2023-12-26 DIAGNOSIS — Z825 Family history of asthma and other chronic lower respiratory diseases: Secondary | ICD-10-CM

## 2023-12-26 DIAGNOSIS — R918 Other nonspecific abnormal finding of lung field: Secondary | ICD-10-CM | POA: Diagnosis not present

## 2023-12-26 DIAGNOSIS — D649 Anemia, unspecified: Secondary | ICD-10-CM | POA: Diagnosis present

## 2023-12-26 DIAGNOSIS — G629 Polyneuropathy, unspecified: Secondary | ICD-10-CM

## 2023-12-26 DIAGNOSIS — Z89511 Acquired absence of right leg below knee: Secondary | ICD-10-CM | POA: Diagnosis not present

## 2023-12-26 DIAGNOSIS — Z79899 Other long term (current) drug therapy: Secondary | ICD-10-CM

## 2023-12-26 DIAGNOSIS — Z881 Allergy status to other antibiotic agents status: Secondary | ICD-10-CM

## 2023-12-26 DIAGNOSIS — M25461 Effusion, right knee: Secondary | ICD-10-CM | POA: Diagnosis not present

## 2023-12-26 DIAGNOSIS — Z8249 Family history of ischemic heart disease and other diseases of the circulatory system: Secondary | ICD-10-CM

## 2023-12-26 DIAGNOSIS — Z8042 Family history of malignant neoplasm of prostate: Secondary | ICD-10-CM

## 2023-12-26 DIAGNOSIS — J90 Pleural effusion, not elsewhere classified: Secondary | ICD-10-CM | POA: Diagnosis not present

## 2023-12-26 DIAGNOSIS — R000 Tachycardia, unspecified: Secondary | ICD-10-CM | POA: Diagnosis not present

## 2023-12-26 DIAGNOSIS — Z83438 Family history of other disorder of lipoprotein metabolism and other lipidemia: Secondary | ICD-10-CM

## 2023-12-26 DIAGNOSIS — Z83719 Family history of colon polyps, unspecified: Secondary | ICD-10-CM

## 2023-12-26 DIAGNOSIS — K5730 Diverticulosis of large intestine without perforation or abscess without bleeding: Secondary | ICD-10-CM | POA: Diagnosis present

## 2023-12-26 DIAGNOSIS — Z86718 Personal history of other venous thrombosis and embolism: Secondary | ICD-10-CM

## 2023-12-26 DIAGNOSIS — N200 Calculus of kidney: Secondary | ICD-10-CM | POA: Diagnosis not present

## 2023-12-26 DIAGNOSIS — J449 Chronic obstructive pulmonary disease, unspecified: Secondary | ICD-10-CM | POA: Diagnosis present

## 2023-12-26 LAB — URINALYSIS, W/ REFLEX TO CULTURE (INFECTION SUSPECTED)
Bacteria, UA: NONE SEEN
Bilirubin Urine: NEGATIVE
Glucose, UA: NEGATIVE mg/dL
Hgb urine dipstick: NEGATIVE
Ketones, ur: NEGATIVE mg/dL
Leukocytes,Ua: NEGATIVE
Nitrite: NEGATIVE
Protein, ur: NEGATIVE mg/dL
Specific Gravity, Urine: 1.044 — ABNORMAL HIGH (ref 1.005–1.030)
pH: 6 (ref 5.0–8.0)

## 2023-12-26 LAB — COMPREHENSIVE METABOLIC PANEL WITH GFR
ALT: 16 U/L (ref 0–44)
AST: 15 U/L (ref 15–41)
Albumin: 3.2 g/dL — ABNORMAL LOW (ref 3.5–5.0)
Alkaline Phosphatase: 77 U/L (ref 38–126)
Anion gap: 10 (ref 5–15)
BUN: 14 mg/dL (ref 8–23)
CO2: 25 mmol/L (ref 22–32)
Calcium: 8.9 mg/dL (ref 8.9–10.3)
Chloride: 103 mmol/L (ref 98–111)
Creatinine, Ser: 0.87 mg/dL (ref 0.61–1.24)
GFR, Estimated: >60 mL/min (ref >60)
Glucose, Bld: 102 mg/dL — ABNORMAL HIGH (ref 70–99)
Potassium: 3.7 mmol/L (ref 3.5–5.1)
Sodium: 138 mmol/L (ref 135–145)
Total Bilirubin: 0.9 mg/dL (ref 0.0–1.2)
Total Protein: 5.9 g/dL — ABNORMAL LOW (ref 6.5–8.1)

## 2023-12-26 LAB — CBC WITH DIFFERENTIAL/PLATELET
Abs Granulocyte: 13.8 K/uL — ABNORMAL HIGH (ref 1.5–6.5)
Abs Immature Granulocytes: 0.39 K/uL — ABNORMAL HIGH (ref 0.00–0.07)
Basophils Absolute: 0 K/uL (ref 0.0–0.1)
Basophils Relative: 0 %
Eosinophils Absolute: 0 K/uL (ref 0.0–0.5)
Eosinophils Relative: 0 %
HCT: 40.3 % (ref 39.0–52.0)
Hemoglobin: 12.6 g/dL — ABNORMAL LOW (ref 13.0–17.0)
Immature Granulocytes: 2 %
Lymphocytes Relative: 8 %
Lymphs Abs: 1.5 K/uL (ref 0.7–4.0)
MCH: 29 pg (ref 26.0–34.0)
MCHC: 31.3 g/dL (ref 30.0–36.0)
MCV: 92.6 fL (ref 80.0–100.0)
Monocytes Absolute: 1.7 K/uL — ABNORMAL HIGH (ref 0.1–1.0)
Monocytes Relative: 10 %
Neutro Abs: 13.8 K/uL — ABNORMAL HIGH (ref 1.7–7.7)
Neutrophils Relative %: 80 %
Platelets: 151 K/uL (ref 150–400)
RBC: 4.35 MIL/uL (ref 4.22–5.81)
RDW: 16.9 % — ABNORMAL HIGH (ref 11.5–15.5)
WBC: 17.4 K/uL — ABNORMAL HIGH (ref 4.0–10.5)
nRBC: 0 % (ref 0.0–0.2)

## 2023-12-26 LAB — RESP PANEL BY RT-PCR (RSV, FLU A&B, COVID)  RVPGX2
Influenza A by PCR: NEGATIVE
Influenza B by PCR: NEGATIVE
Resp Syncytial Virus by PCR: NEGATIVE
SARS Coronavirus 2 by RT PCR: NEGATIVE

## 2023-12-26 LAB — PROCALCITONIN: Procalcitonin: 0.16 ng/mL

## 2023-12-26 LAB — I-STAT CG4 LACTIC ACID, ED: Lactic Acid, Venous: 1.5 mmol/L (ref 0.5–1.9)

## 2023-12-26 MED ORDER — TAMSULOSIN HCL 0.4 MG PO CAPS
0.4000 mg | ORAL_CAPSULE | Freq: Every day | ORAL | Status: DC
  Administered 2023-12-26 – 2023-12-28 (×3): 0.4 mg via ORAL
  Filled 2023-12-26 (×3): qty 1

## 2023-12-26 MED ORDER — IOHEXOL 350 MG/ML SOLN
75.0000 mL | Freq: Once | INTRAVENOUS | Status: AC | PRN
Start: 2023-12-26 — End: 2023-12-26
  Administered 2023-12-26: 75 mL via INTRAVENOUS

## 2023-12-26 MED ORDER — ARFORMOTEROL TARTRATE 15 MCG/2ML IN NEBU
15.0000 ug | INHALATION_SOLUTION | Freq: Two times a day (BID) | RESPIRATORY_TRACT | Status: DC
  Administered 2023-12-26 – 2023-12-29 (×6): 15 ug via RESPIRATORY_TRACT
  Filled 2023-12-26 (×5): qty 2

## 2023-12-26 MED ORDER — ACETAMINOPHEN 650 MG RE SUPP: 650.0000 mg | Freq: Four times a day (QID) | RECTAL | Status: DC | PRN

## 2023-12-26 MED ORDER — VANCOMYCIN HCL IN DEXTROSE 1-5 GM/200ML-% IV SOLN: 1000.0000 mg | Freq: Once | INTRAVENOUS | Status: DC

## 2023-12-26 MED ORDER — PREDNISONE 5 MG PO TABS: 5.0000 mg | ORAL_TABLET | Freq: Every day | ORAL | Status: DC

## 2023-12-26 MED ORDER — ISOSORBIDE MONONITRATE ER 30 MG PO TB24
30.0000 mg | ORAL_TABLET | Freq: Every day | ORAL | Status: DC
  Administered 2023-12-27 – 2023-12-29 (×3): 30 mg via ORAL
  Filled 2023-12-26 (×3): qty 1

## 2023-12-26 MED ORDER — VANCOMYCIN HCL 1250 MG/250ML IV SOLN
1250.0000 mg | Freq: Two times a day (BID) | INTRAVENOUS | Status: DC
  Administered 2023-12-27 – 2023-12-28 (×3): 1250 mg via INTRAVENOUS
  Filled 2023-12-26 (×4): qty 250

## 2023-12-26 MED ORDER — MELATONIN 5 MG PO TABS: 5.0000 mg | ORAL_TABLET | Freq: Every evening | ORAL | Status: DC | PRN

## 2023-12-26 MED ORDER — SODIUM CHLORIDE 0.9 % IV SOLN
2.0000 g | Freq: Three times a day (TID) | INTRAVENOUS | Status: DC
  Administered 2023-12-26 – 2023-12-28 (×5): 2 g via INTRAVENOUS
  Filled 2023-12-26 (×6): qty 12.5

## 2023-12-26 MED ORDER — APIXABAN 5 MG PO TABS
5.0000 mg | ORAL_TABLET | Freq: Two times a day (BID) | ORAL | Status: DC
  Administered 2023-12-26 – 2023-12-29 (×6): 5 mg via ORAL
  Filled 2023-12-26 (×3): qty 1
  Filled 2023-12-26: qty 2
  Filled 2023-12-26 (×2): qty 1

## 2023-12-26 MED ORDER — PREDNISONE 20 MG PO TABS: 10.0000 mg | ORAL_TABLET | Freq: Every day | ORAL | Status: DC

## 2023-12-26 MED ORDER — SODIUM CHLORIDE 0.9 % IV SOLN
2.0000 g | Freq: Once | INTRAVENOUS | Status: AC
  Administered 2023-12-26: 2 g via INTRAVENOUS
  Filled 2023-12-26: qty 12.5

## 2023-12-26 MED ORDER — SODIUM CHLORIDE 0.9% FLUSH
3.0000 mL | Freq: Two times a day (BID) | INTRAVENOUS | Status: DC
  Administered 2023-12-26 – 2023-12-29 (×5): 3 mL via INTRAVENOUS

## 2023-12-26 MED ORDER — DILTIAZEM HCL ER COATED BEADS 180 MG PO CP24
180.0000 mg | ORAL_CAPSULE | Freq: Every day | ORAL | Status: DC
  Administered 2023-12-27 – 2023-12-29 (×3): 180 mg via ORAL
  Filled 2023-12-26 (×3): qty 1

## 2023-12-26 MED ORDER — DULOXETINE HCL 30 MG PO CPEP
30.0000 mg | ORAL_CAPSULE | Freq: Every day | ORAL | Status: DC
  Administered 2023-12-27 – 2023-12-29 (×3): 30 mg via ORAL
  Filled 2023-12-26 (×3): qty 1

## 2023-12-26 MED ORDER — POLYETHYLENE GLYCOL 3350 17 G PO PACK: 17.0000 g | PACK | Freq: Every day | ORAL | Status: DC | PRN

## 2023-12-26 MED ORDER — PANTOPRAZOLE SODIUM 40 MG PO TBEC
40.0000 mg | DELAYED_RELEASE_TABLET | Freq: Two times a day (BID) | ORAL | Status: DC
  Administered 2023-12-26 – 2023-12-29 (×6): 40 mg via ORAL
  Filled 2023-12-26 (×6): qty 1

## 2023-12-26 MED ORDER — ATORVASTATIN CALCIUM 10 MG PO TABS
20.0000 mg | ORAL_TABLET | Freq: Every day | ORAL | Status: DC
  Administered 2023-12-27 – 2023-12-29 (×3): 20 mg via ORAL
  Filled 2023-12-26 (×3): qty 2

## 2023-12-26 MED ORDER — ALBUTEROL SULFATE (2.5 MG/3ML) 0.083% IN NEBU: 3.0000 mL | INHALATION_SOLUTION | RESPIRATORY_TRACT | Status: DC | PRN

## 2023-12-26 MED ORDER — PREDNISONE 10 MG PO TABS
10.0000 mg | ORAL_TABLET | Freq: Two times a day (BID) | ORAL | Status: DC
  Administered 2023-12-26 – 2023-12-29 (×6): 10 mg via ORAL
  Filled 2023-12-26 (×6): qty 1

## 2023-12-26 MED ORDER — PREGABALIN 100 MG PO CAPS
100.0000 mg | ORAL_CAPSULE | Freq: Two times a day (BID) | ORAL | Status: DC
Start: 2023-12-26 — End: 2023-12-29
  Administered 2023-12-26 – 2023-12-29 (×6): 100 mg via ORAL
  Filled 2023-12-26 (×6): qty 1

## 2023-12-26 MED ORDER — VANCOMYCIN HCL 2000 MG/400ML IV SOLN
2000.0000 mg | Freq: Once | INTRAVENOUS | Status: AC
  Administered 2023-12-26: 2000 mg via INTRAVENOUS
  Filled 2023-12-26: qty 400

## 2023-12-26 MED ORDER — ACETAMINOPHEN 325 MG PO TABS
650.0000 mg | ORAL_TABLET | Freq: Four times a day (QID) | ORAL | Status: DC | PRN
  Administered 2023-12-27: 650 mg via ORAL
  Filled 2023-12-26: qty 2

## 2023-12-26 MED ORDER — ENSURE PLUS HIGH PROTEIN PO LIQD
237.0000 mL | Freq: Two times a day (BID) | ORAL | Status: DC
  Administered 2023-12-26 – 2023-12-29 (×3): 237 mL via ORAL

## 2023-12-26 MED ORDER — SODIUM CHLORIDE 0.9 % IV BOLUS
500.0000 mL | Freq: Once | INTRAVENOUS | Status: AC
  Administered 2023-12-26: 500 mL via INTRAVENOUS

## 2023-12-26 MED ORDER — IOHEXOL 350 MG/ML SOLN
75.0000 mL | Freq: Once | INTRAVENOUS | Status: AC | PRN
  Administered 2023-12-26: 75 mL via INTRAVENOUS

## 2023-12-26 NOTE — Telephone Encounter (Signed)
 Copied from CRM [id number]. Topic: Clinical - Red Word Triage >> Dec 26, 2023  8:13 AM Diannia H wrote: Red Word that prompted transfer to Nurse Triage: Patient woke up this morning and had a fever of 100.7 and he is not feeling well, he had surgery in June and was released in July and his wife is not sure if its an infection. He has random places that are just randomly bleeding.

## 2023-12-26 NOTE — ED Notes (Signed)
 Patient placed on cardiac monitor per EDP.

## 2023-12-26 NOTE — Progress Notes (Signed)
 Pharmacy Antibiotic Note  Levi [name] is a 78 y.o. male admitted on 12/26/2023 with concern for pneumonia.  Pharmacy has been consulted for vancomycin  dosing.  Plan: Vancomycin  2000mg  x 1 load > Vancomycin  1250mg  q12h (eAUC 543, Scr 0.87) F/u renal function, infectious work up and length of therapy MRSA pcr pending Vancomycin  levels as needed  Height: 6' 2 (188 cm) Weight: 86.2 kg (190 lb) IBW/kg (Calculated) : 82.2  Temp (24hrs), Avg:97.7 F (36.5 C), Min:97.6 F (36.4 C), Max:97.7 F (36.5 C)  Recent Labs  Lab 12/26/23 0951 12/26/23 0955  WBC 17.4*  --   CREATININE 0.87  --   LATICACIDVEN  --  1.5    Estimated Creatinine Clearance: 84 mL/min (by C-G formula based on SCr of 0.87 mg/dL).    Allergies  Allergen Reactions   Levaquin  [Levofloxacin ] Itching    Tolerated ciprofloxacin  in 2022.   Thank you for allowing pharmacy to be a part of this patient's care.  Leonor GORMAN Bash 12/26/2023 3:11 PM

## 2023-12-26 NOTE — ED Notes (Signed)
 Patient transported to CT

## 2023-12-26 NOTE — ED Notes (Signed)
Patient attempting to use urinal at this time.

## 2023-12-26 NOTE — H&P (Addendum)
 History and Physical   Levi [name] FMW:[id number] DOB: [birth year] DOA: 12/26/2023  PCP: Curtis Debby PARAS, MD   Patient coming from: Home  Chief Complaint: Fever  HPI: Levi [name] is a 78 y.o. male with medical history significant of hypertension, hyperlipidemia, GERD, atrial fibrillation, anemia, chronic systolic CHF, depression, neuropathy, COPD/asthma, chronic superficial VTE, BPH presenting with fever.  Patient underwent surgical intervention in June.  He was admitted 6/14-6/22 for right foot osteomyelitis for which she was treated with antibiotics and then underwent right BKA.  Subsequently went to inpatient rehab and was discharged charged from there on 7/11.  He followed up with orthopedic surgery on 7/17 and had staples removed.  Has not yet followed up again per chart.  Today he woke up with a fever of 101 and chills.  Call PCP who recommended patient proceed to the ED.  Patient also reports some mild weakness and mild right lower quadrant pain.  There is some mild redness and possible discharge at stump wound but no other significant changes.  Denies chest pain, shortness of breath,  constipation, diarrhea, nausea, vomiting.  ED Course: Vital signs in the ED notable for heart rate in the 80s-100s, blood pressure in the 100s-110 systolic.  Lab work notable for CMP with glucose 102, protein 5.9, albumin 3.2.  CBC with leukocytosis to 17.4, hemoglobin stable 12.6.  Lactic acid normal with repeat pending.  Rester panel for flu COVID RSV negative.  Urinalysis pending.  Blood cultures pending.  Chest x-ray showed no acute abnormality.  CT of the chest showed focal airspace consolidation in right lower lobe possibly representing pneumonia with left lower lobe atelectasis.  CT abdomen pelvis did show changes of the right lower lobe as well.  Also noted diverticulosis without diverticulitis.  Changes consistent with prior rib fractures.  Postop changes of the L-spine and bilateral  hips without complication.  CT of the right femur and right knee showed prosthesis in place, ossification to prosthesis, small supra patellar bursa effusion.  Also noted was mild edema at the distal thigh, deformity suspected to be site of bone graft, edema at the distal portion of the stump without drainable fluid collection.  Patient started on mycin, cefepime  in the ED and received 500 cc IV fluid.  EDP to speak with orthopedic surgery on-call to review case and images and formal consult as indicated versus phone recommendations.  Review of Systems: As per HPI otherwise all other systems reviewed and are negative.  Past Medical History:  Diagnosis Date   Agent orange exposure 1970's   takes Imdur ,Diltiazem , and Atacand  daily   Agent orange exposure    ALLERGIC RHINITIS    Allergy     seasonal allergies   Anemia    iron  deficiency   Arthritis    Asthma    uses inhaler and nebulizers   Atrial fibrillation (HCC)    Bruises easily    d/t prednisone  daily   Cataract    Cellulitis 03/22/2020   Chest wall pain 09/21/2019   L scapula, reported 4/27 21/     CHF (congestive heart failure) (HCC)    dx-on meds to treat   Chronic bronchitis (HCC)    used to get it q yr; last time ?2008 (04/28/2012)   Clotting disorder (HCC) 2019   On blood thinner when I bleed it is for a consideral amount of time. This is tegardless of size of wound.   COPD (chronic obstructive pulmonary disease) (HCC)    agent orange exposure  Degenerative disk disease    everywhere (04/28/2012)   Depression due to physical illness 12/15/2023   Diverticulitis    Dysrhythmia    afib   Elevated uric acid in blood    takes Allopurinol  daily   Emphysema of lung (HCC)    Enlarged prostate    but not on any meds   GERD (gastroesophageal reflux disease)    takes Protonix  daily   H/O hiatal hernia    Headache    History of colonoscopy    History of pneumonia    a. 2010   Hyperlipidemia    takes Lipitor daily;  pt. states he takes a preventive   Hypothyroidism    Joint pain    Joint swelling    Leukocytosis    MSSA bacteremia 03/19/2023   Nausea and vomiting 11/12/2022   Neuromuscular disorder (HCC)    bilat legs, and bilat arms   Neuropathy    Osteoporosis 2012   PAF (paroxysmal atrial fibrillation) (HCC)    a. Dx 04/2012, CHA2DS2VASc = 1 (age);  b. 04/2012 Echo: EF 40-50%, mild MR.   Personal history of colonic adenomas 02/09/2013   Pneumonia    Sepsis (HCC) 03/18/2023   Shortness of breath dyspnea    Thyroid  disease    no taking meds-caused dizziness    Past Surgical History:  Procedure Laterality Date   33 HOUR PH STUDY N/A 05/30/2021   Procedure: 24 HOUR PH STUDY;  Surgeon: San Sandor GAILS, DO;  Location: [facility];  Service: Gastroenterology;  Laterality: N/A;   AMPUTATION Right 11/14/2023   Procedure: AMPUTATION BELOW KNEE;  Surgeon: Harden Jerona GAILS, MD;  Location[facility] OR;  Service: Orthopedics;  Laterality: Right;   AMPUTATION TOE Bilateral 08/13/2023   Procedure: LEFT SECOND TOE AMPUTATION AND RIGHT FIFTH TOE AMPUTATION;  Surgeon: Malvin Marsa FALCON, DPM;  Location: [facility] OR;  Service: Orthopedics/Podiatry;  Laterality: Bilateral;   ANTERIOR CERVICAL DECOMP/DISCECTOMY FUSION N/A 07/21/2012   Procedure: ANTERIOR CERVICAL DECOMPRESSION/DISCECTOMY FUSION 2 LEVELS;  Surgeon: Victory Gens, MD;  Location: [facility];  Service: Neurosurgery;  Laterality: N/A;  C4-5 C5-6 Anterior cervical decompression/diskectomy/fusion   ANTERIOR LAT LUMBAR FUSION N/A 04/07/2017   Procedure: Thoracic twelve-Lumbar one Anterolateral decompression/fusion;  Surgeon: Gens Victory, MD;  Location: [facility] OR;  Service: Neurosurgery;  Laterality: N/A;   ARTHRODESIS FOOT WITH WEIL OSTEOTOMY Left 11/07/2022   Procedure: LEFT THIRD METATARSOPHALANGEAL CAPSULOTOMY, WEIL OSTEOTOMY;  Surgeon: Elsa Lonni SAUNDERS, MD;  Location: [facility] OR;  Service: Orthopedics;  Laterality: Left;  LENGTH OF SURGERY: 60 MINUTES    BACK SURGERY     x 3   BELOW KNEE LEG AMPUTATION Right    June 2025   BONE EXOSTOSIS EXCISION Right 10/16/2023   Procedure: EXCISION, EXOSTOSIS;  Surgeon: Janit Thresa [name], DPM;  Location: [facility];  Service: Orthopedics/Podiatry;  Laterality: Right;   CARDIAC CATHETERIZATION  11/2008   Dr. Jeffrie - 20% calcified non flow limiting left main, 50% EF apical hypokinesis   CARPAL TUNNEL RELEASE Bilateral    CERVICAL DISC ARTHROPLASTY N/A 04/07/2017   Procedure: Cervical six-seven Disc arthroplasty;  Surgeon: Gens Victory, MD;  Location: [facility] OR;  Service: Neurosurgery;  Laterality: N/A;   CHEST TUBE INSERTION Left 04/11/2017   Procedure: CHEST TUBE INSERTION;  Surgeon: Fleeta Hanford Coy, MD;  Location[facility] OR;  Service: Thoracic;  Laterality: Left;   CHOLECYSTECTOMY     COLONOSCOPY  2016   CG-MAC-miralax (good)servere TICS/TA x 2   ESOPHAGEAL MANOMETRY N/A 05/30/2021   Procedure: ESOPHAGEAL MANOMETRY (  EM);  Surgeon: San Sandor GAILS, DO;  Location: [facility];  Service: Gastroenterology;  Laterality: N/A;  ph impedence   ESOPHAGOGASTRODUODENOSCOPY     EYE SURGERY Bilateral 2011   steroidal encapsulation (04/28/2012)   FOOT SURGERY Right    x 2   JOINT REPLACEMENT     Many surgeries including 5 joit replacements   KNEE ARTHROSCOPY Bilateral    LATERAL / POSTERIOR COMBINED FUSION LUMBAR SPINE  2012   LEFT ATRIAL APPENDAGE OCCLUSION N/A 02/22/2021   Procedure: LEFT ATRIAL APPENDAGE OCCLUSION;  Surgeon: Cindie Ole DASEN, MD;  [facility]: [facility];  Service: Cardiovascular;  Laterality: N/A;   NEUROPLASTY / TRANSPOSITION ULNAR NERVE AT ELBOW Bilateral    POLYPECTOMY  2016   severe TICS/TA x 2   POSTERIOR CERVICAL LAMINECTOMY WITH MET- RX Right 06/11/2021   Procedure: Right Cervical six-seven  Laminectomy and foraminotomy with metrex;  Surgeon: Colon Shove, MD;  Location: [facility];  Service: Neurosurgery;  Laterality: Right;   REFRACTIVE SURGERY Left 07/07/2023   laser surgery but still  has floaters   REVERSE SHOULDER ARTHROPLASTY  04/28/2012   Procedure: REVERSE SHOULDER ARTHROPLASTY;  Surgeon: Eva Elsie Herring, MD;  Location: [facility] OR;  Service: Orthopedics;  Laterality: Left;  Left shouder reverse total shoulder arthroplasty   SHOULDER SURGERY     SPINE SURGERY     I have had 7 spinal surgeries   TEE WITHOUT CARDIOVERSION N/A 02/22/2021   Procedure: TRANSESOPHAGEAL ECHOCARDIOGRAM (TEE);  Surgeon: Cindie Ole DASEN, MD;  Location: [facility] INVASIVE CV LAB;  Service: Cardiovascular;  Laterality: N/A;   TENDON REPAIR Right 08/07/2020   Procedure: RIGHT HAND LIGAMENT RECONSTRUCTION AND TENDON INTERPOSITION;  Surgeon: Yvone Rush, MD;  Location: [facility];  Service: Orthopedics;  Laterality: Right;   TONSILLECTOMY AND ADENOIDECTOMY  1954   TOTAL HIP ARTHROPLASTY Left 2011   left (04/28/2012)   TOTAL HIP ARTHROPLASTY Right 07/31/2015   Procedure: TOTAL HIP ARTHROPLASTY ANTERIOR APPROACH;  Surgeon: Rush Yvone, MD;  Location: [facility] OR;  Service: Orthopedics;  Laterality: Right;   TOTAL KNEE ARTHROPLASTY  01/10/2012   Procedure: TOTAL KNEE ARTHROPLASTY;  Surgeon: Rush LITTIE Yvone, MD;  Location: [facility] OR;  Service: Orthopedics;;  left total knee arthroplasty   TOTAL KNEE ARTHROPLASTY Right 06/17/2014   Procedure: TOTAL KNEE ARTHROPLASTY;  Surgeon: Rush LITTIE Yvone, MD;  Location: [facility] OR;  Service: Orthopedics;  Laterality: Right;   toupet fundolplication  11/01/2021   TRANSESOPHAGEAL ECHOCARDIOGRAM (CATH LAB) N/A 03/24/2023   Procedure: TRANSESOPHAGEAL ECHOCARDIOGRAM;  Surgeon: Mona Vinie BROCKS, MD;  Location: [facility];  Service: Cardiovascular;  Laterality: N/A;    Social History  reports that he has never smoked. He has never used smokeless tobacco. He reports that he does not drink alcohol and does not use drugs.  Allergies  Allergen Reactions   Levaquin  [Levofloxacin ] Itching    Tolerated ciprofloxacin  in 2022.    Family History  Problem Relation Age  of Onset   Diabetes Mother    Heart disease Father        Died 26, MI   Cancer Father    Hyperlipidemia Father    Stroke Father    Prostate cancer Brother    Colon polyps Brother    Diabetes Brother    Colon polyps Brother    Asthma Brother    COPD Brother    Colon cancer Brother        Dx age 49   Cancer Brother    Colon  cancer Other    Esophageal cancer Neg Hx    Rectal cancer Neg Hx    Stomach cancer Neg Hx    Pancreatic cancer Neg Hx    Kidney disease Neg Hx    Liver disease Neg Hx    Neuropathy Neg Hx   Reviewed on admission  Prior to Admission medications   Medication Sig Start Date End Date Taking? Authorizing Provider  acetaminophen  (TYLENOL ) 325 MG tablet Take 1-2 tablets (325-650 mg total) by mouth every 6 (six) hours as needed for mild pain (pain score 1-3) (or temp > 100.5). 12/05/23   Love, Sharlet RAMAN, PA-C  albuterol  (VENTOLIN  HFA) 108 (90 Base) MCG/ACT inhaler INHALE 2 PUFFS BY MOUTH EVERY 4 HOURS AS NEEDED FOR WHEEZE OR FOR SHORTNESS OF BREATH 01/14/23   Neysa Rama D, MD  apixaban  (ELIQUIS ) 5 MG TABS tablet Take 1 tablet (5 mg total) by mouth 2 (two) times daily. 12/05/23   Love, Sharlet RAMAN, PA-C  atorvastatin  (LIPITOR) 20 MG tablet Take 1 tablet (20 mg total) by mouth daily. 12/03/23   Jeffrie Oneil BROCKS, MD  cyclobenzaprine  (FLEXERIL ) 10 MG tablet Take 1 tablet (10 mg total) by mouth 3 (three) times daily as needed for muscle spasms. 09/04/23   Curtis Debby PARAS, MD  diltiazem  (CARDIZEM  CD) 180 MG 24 hr capsule Take 1 capsule (180 mg total) by mouth daily. 08/25/23   Jeffrie Oneil BROCKS, MD  DULoxetine  (CYMBALTA ) 30 MG capsule Take 1 capsule (30 mg total) by mouth daily. 12/15/23   Curtis Debby PARAS, MD  feeding supplement (ENSURE PLUS HIGH PROTEIN) LIQD Take 237 mLs by mouth 2 (two) times daily between meals. 11/16/23   Ghimire, Levi HERO, MD  ferrous gluconate  (FERGON) 324 MG tablet Take 1 tablet (324 mg total) by mouth daily. 12/05/23   Love, Sharlet RAMAN, PA-C   furosemide  (LASIX ) 40 MG tablet Take 0.5 tablets (20 mg total) by mouth daily. 12/05/23   Love, Sharlet RAMAN, PA-C  hydrocerin (EUCERIN) CREA Apply 1 Application topically daily. To left foot 12/05/23   Love, Sharlet RAMAN, PA-C  ipratropium-albuterol  (DUONEB) 0.5-2.5 (3) MG/3ML SOLN 1 NEBULE EVERY 6 HOURS AS NEEDED. **J45.3** 03/22/20   Young, Rama D, MD  isosorbide  mononitrate (IMDUR ) 30 MG 24 hr tablet Take 1 tablet (30 mg total) by mouth daily. 12/18/23   Jeffrie Oneil BROCKS, MD  melatonin 5 MG TABS Take 1 tablet (5 mg total) by mouth at bedtime as needed. 12/05/23   Love, Sharlet RAMAN, PA-C  Multiple Vitamin (MULTIVITAMIN WITH MINERALS) TABS tablet Take 1 tablet by mouth daily. 12/05/23   Love, Sharlet RAMAN, PA-C  Olodaterol HCl (STRIVERDI RESPIMAT) 2.5 MCG/ACT AERS INHALE 2 PUFFS BY MOUTH INTO THE LUNGS DAILY 03/10/23   Neysa Rama D, MD  ondansetron  (ZOFRAN -ODT) 8 MG disintegrating tablet Take 1 tablet (8 mg total) by mouth every 8 (eight) hours as needed for nausea. 04/07/23   Curtis Debby PARAS, MD  oxyCODONE  (OXY IR/ROXICODONE ) 5 MG immediate release tablet Take 1-2 tablets (5-10 mg total) by mouth every 6 (six) hours as needed for severe pain (pain score 7-10) (5 mg for pain level 5-7 and 10 mg for pain > level 7). 12/05/23   Love, Sharlet RAMAN, PA-C  pantoprazole  (PROTONIX ) 40 MG tablet Take 1 tablet (40 mg total) by mouth 2 (two) times daily before a meal. 12/05/23   Love, Sharlet RAMAN, PA-C  polyethylene glycol (MIRALAX  / GLYCOLAX ) 17 g packet Take 17 g by mouth daily. 11/17/23   Ghimire,  Levi HERO, MD  potassium chloride  SA (KLOR-CON  M) 20 MEQ tablet Take 2 tablets (40 mEq total) by mouth daily. 12/05/23   Love, Sharlet RAMAN, PA-C  predniSONE  (DELTASONE ) 5 MG tablet 3 tabs daily 12/25/23   Neysa Rama D, MD  pregabalin  (LYRICA ) 100 MG capsule Take 1 capsule (100 mg total) by mouth 2 (two) times daily. 12/05/23   Love, Sharlet RAMAN, PA-C  tamsulosin  (FLOMAX ) 0.4 MG CAPS capsule Take 1 capsule (0.4 mg total) by mouth at  bedtime. 12/05/23   Levi Sharlet RAMAN, PA-C    Physical Exam: Vitals:   12/26/23 9056 12/26/23 1130 12/26/23 1300 12/26/23 1347  BP:  103/69    Pulse:  84 80   Resp:  20 15   Temp:    97.6 F (36.4 C)  TempSrc:    Oral  SpO2:  96% 99%   Weight: 86.2 kg     Height: 6' 2 (1.88 m)       Physical Exam Constitutional:      General: He is not in acute distress.    Appearance: Normal appearance.  HENT:     Head: Normocephalic and atraumatic.     Mouth/Throat:     Mouth: Mucous membranes are moist.     Pharynx: Oropharynx is clear.  Eyes:     Extraocular Movements: Extraocular movements intact.     Pupils: Pupils are equal, round, and reactive to light.  Cardiovascular:     Rate and Rhythm: Normal rate and regular rhythm.     Pulses: Normal pulses.     Heart sounds: Normal heart sounds.  Pulmonary:     Effort: Pulmonary effort is normal. No respiratory distress.     Breath sounds: Rales (Bilateral bases) present.  Abdominal:     General: Bowel sounds are normal. There is no distension.     Palpations: Abdomen is soft.     Tenderness: There is no abdominal tenderness.  Musculoskeletal:        General: No swelling or deformity.     Comments: R BKA with erythema and minimal discharge. Small pressure related abrasion over L shin.  Skin:    General: Skin is warm and dry.  Neurological:     General: No focal deficit present.     Mental Status: Mental status is at baseline.    Labs on Admission: I have personally reviewed following labs and imaging studies  CBC: Recent Labs  Lab 12/26/23 0951  WBC 17.4*  NEUTROABS 13.8*  HGB 12.6*  HCT 40.3  MCV 92.6  PLT 151    Basic Metabolic Panel: Recent Labs  Lab 12/26/23 0951  NA 138  K 3.7  CL 103  CO2 25  GLUCOSE 102*  BUN 14  CREATININE 0.87  CALCIUM  8.9    GFR: Estimated Creatinine Clearance: 84 mL/min (by C-G formula based on SCr of 0.87 mg/dL).  Liver Function Tests: Recent Labs  Lab 12/26/23 0951  AST 15   ALT 16  ALKPHOS 77  BILITOT 0.9  PROT 5.9*  ALBUMIN 3.2*    Urine analysis:    Component Value Date/Time   COLORURINE YELLOW 12/26/2023 1342   APPEARANCEUR CLEAR 12/26/2023 1342   APPEARANCEUR Cloudy (A) 11/03/2023 1113   LABSPEC 1.044 (H) 12/26/2023 1342   PHURINE 6.0 12/26/2023 1342   GLUCOSEU NEGATIVE 12/26/2023 1342   HGBUR NEGATIVE 12/26/2023 1342   BILIRUBINUR NEGATIVE 12/26/2023 1342   BILIRUBINUR Negative 11/03/2023 1113   KETONESUR NEGATIVE 12/26/2023 1342   PROTEINUR NEGATIVE 12/26/2023  1342   UROBILINOGEN 0.2 05/02/2022 0916   UROBILINOGEN 0.2 04/21/2012 0936   NITRITE NEGATIVE 12/26/2023 1342   LEUKOCYTESUR NEGATIVE 12/26/2023 1342    Radiological Exams on Admission: CT KNEE RIGHT W CONTRAST Result Date: 12/26/2023 CLINICAL DATA:  Wound along BKA incision site, query abscess EXAM: CT OF THE RIGHT KNEE WITH CONTRAST TECHNIQUE: Multidetector CT imaging was performed following the standard protocol during bolus administration of intravenous contrast. RADIATION DOSE REDUCTION: This exam was performed according to the departmental dose-optimization program which includes automated exposure control, adjustment of the mA and/or kV according to patient size and/or use of iterative reconstruction technique. CONTRAST:  75mL OMNIPAQUE  IOHEXOL  350 MG/ML SOLN COMPARISON:  11/10/2023 FINDINGS: Bones/Joint/Cartilage Below the knee amputation with sharp truncation of the tibia and fibula with mild marginal periostitis/osteoid, compatible with normal healing response. No evidence of osteomyelitis involving the tibia or fibula. Total knee prosthesis.  Small knee effusion. Ligaments Suboptimally assessed by CT. Muscles and Tendons Expected appearance of regional musculature given the below the knee amputation performed on 11/14/2023. No intramuscular abscess observed. Soft tissues Infiltrative edema signal in the subcutaneous tissues along the distal stump without abscess or drainable process  identified. At this time. Atheromatous vascular calcifications. IMPRESSION: 1. Infiltrative edema signal in the subcutaneous tissues along the distal stump without abscess or drainable process identified. 2. No evidence of osteomyelitis involving the tibia or fibula. 3. Total knee prosthesis. Small knee effusion. 4. Atheromatous vascular calcifications. Electronically Signed   By: Ryan Salvage M.D.   On: 12/26/2023 14:10   CT CHEST W CONTRAST Result Date: 12/26/2023 EXAM: CT CHEST WITH CONTRAST 12/26/2023 01:33:00 PM TECHNIQUE: CT of the chest was performed with the administration of intravenous contrast. Multiplanar reformatted images are provided for review. Automated exposure control, iterative reconstruction, and/or weight based adjustment of the mA/kV was utilized to reduce the radiation dose to as low as reasonably achievable. CONTRAST: 75mL (iohexol  (OMNIPAQUE ) 350 MG/ML injection 75 mL IOHEXOL  350 MG/ML SOLN) COMPARISON: None available. CLINICAL HISTORY: Trying to better assess whether or not true airspace opacity. Fever; Post-op Rt BKA; CT KNEE RIGHT W CONTRAST; This imaging needs to include all the way to the BKA incision site. Eval for post op fluid collection. Pt came in via POV d/t this morning having a fever of 101 \\T \ since he had a Rt sided BKA 5 weeks ago he came in since he was told to get checked out if anything like this happens. A/Ox4, rates post-op wound pain 6/10 during triage. Denies any drainage from the site \\T \ reports even when he had a wound vac in the hospital it did not drain much then either (per pt). FINDINGS: MEDIASTINUM: Scattered coronary and aortic calcifications. Heart and pericardium are unremarkable. The central airways are clear. LYMPH NODES: No mediastinal, hilar or axillary lymphadenopathy. LUNGS AND PLEURA: Focal airspace consolidation in the posterior basal segment of the right lower lobe. Dependent atelectasis posteriorly at the left lung base. Trace right  pleural effusion. No pneumothorax. SOFT TISSUES/BONES: Left shoulder arthroplasty. Anterior cervical fixation hardware. Spurring in the lower thoracic spine. Thoracolumbar fixation hardware, incompletely visualized distally. Right shoulder DJD. No acute abnormality of the soft tissues. UPPER ABDOMEN: Cholecystectomy clips. Left renal cyst, incompletely characterized. IMPRESSION: 1. Focal airspace consolidation in the posterior basal segment of the right lower lobe, possibly representing pneumonia. 2. Dependent atelectasis posteriorly at the left lung base. 3. Trace right pleural effusion. Electronically signed by: Dayne Hassell MD 12/26/2023 02:02 PM EDT RP Workstation: [id number]  CT FEMUR RIGHT W CONTRAST Result Date: 12/26/2023 CLINICAL DATA:  Erythema and questionable discharge along incisional site from right below the knee amputation formed on 11/14/2023 EXAM: CT OF THE LOWER RIGHT EXTREMITY WITH CONTRAST TECHNIQUE: Multidetector CT imaging of the lower right extremity was performed according to the standard protocol following intravenous contrast administration. RADIATION DOSE REDUCTION: This exam was performed according to the departmental dose-optimization program which includes automated exposure control, adjustment of the mA and/or kV according to patient size and/or use of iterative reconstruction technique. CONTRAST:  75mL OMNIPAQUE  IOHEXOL  350 MG/ML SOLN COMPARISON:  None Available. FINDINGS: Bones/Joint/Cartilage Right total hip prosthesis. Total knee prosthesis. Mild heterotopic ossifications scattered anterior to the right hip prosthesis mainly along the joint and iliopsoas tendon and to a lesser extent extending towards the tensor fascia lata. Known old pubic ramus fractures. Femur otherwise unremarkable. Deformity anteriorly along the right iliac bone with some heterotopic calcification, possibly a site of prior bone graft harvesting. Small effusion in the suprapatellar bursa. Ligaments  Suboptimally assessed by CT. Muscles and Tendons Minimal calcification in the mildly atrophic rectus femoris muscle laterally. Soft tissues Sciatic notch sciatic nerve unremarkable. Mild subcutaneous edema posteromedially along the distal thigh on image 289 series 4. Atheromatous vascular calcification of arterial structures noted. Please note that the knee joint was included but the distal tibia/fibula were is not included on today's CT of the femur. IMPRESSION: 1. Right total hip prosthesis and total knee prosthesis. 2. Mild heterotopic ossifications anterior to the right hip prosthesis mainly along the joint and iliopsoas tendon and to a lesser extent extending towards the tensor fascia lata. 3. Small effusion in the suprapatellar bursa. 4. Mild subcutaneous edema posteromedially along the distal thigh. 5. Atheromatous vascular calcification of arterial structures. 6. Please note that the knee joint was included but the distal tibia/fibula were not included on today's CT of the femur. 7. Known old pubic ramus fractures. 8. Deformity anteriorly along the right iliac bone with some heterotopic calcification, possibly a site of prior bone graft harvesting. Electronically Signed   By: Ryan Salvage M.D.   On: 12/26/2023 12:24   CT ABDOMEN PELVIS W CONTRAST Result Date: 12/26/2023 CLINICAL DATA:  Right lower quadrant abdominal pain. EXAM: CT ABDOMEN AND PELVIS WITH CONTRAST TECHNIQUE: Multidetector CT imaging of the abdomen and pelvis was performed using the standard protocol following bolus administration of intravenous contrast. RADIATION DOSE REDUCTION: This exam was performed according to the departmental dose-optimization program which includes automated exposure control, adjustment of the mA and/or kV according to patient size and/or use of iterative reconstruction technique. CONTRAST:  75mL OMNIPAQUE  IOHEXOL  350 MG/ML SOLN COMPARISON:  CT pelvis 03/19/2023 and CT abdomen 09/25/2021 FINDINGS: Lower chest:  Trace right pleural effusion. Atelectasis/volume loss posteriorly in the left lower lobe along the diaphragm. Indistinct airspace opacity in the posterior basal segment right lower lobe concerning for potential pneumonia. Hepatobiliary: Cholecystectomy. Common bile duct 1.1 cm in diameter, roughly similar compared to 09/25/2021, and likely reflecting a physiologic response to cholecystectomy. Pancreas: Unremarkable Spleen: Unremarkable Adrenals/Urinary Tract: Simple left renal cysts warrant no further imaging workup. 3 mm right kidney upper pole nonobstructive renal calculus. Partial obscuration of the urinary bladder and distal ureters due to streak artifact from the metal hardware. Stomach/Bowel: Sigmoid colon diverticulosis. Scattered diverticula of the descending colon. No findings of active diverticulitis. There is evidence of prior fundoplication in the proximal stomach. Vascular/Lymphatic: Atherosclerosis is present, including aortoiliac atherosclerotic disease. Reproductive: Unremarkable Other: No supplemental non-categorized findings. Musculoskeletal: Bilateral lower  rib deformities compatible with prior fractures, the left lower rib deformities appear chronic in the right lower rib deformities are potentially late subacute or chronic. Solid interbody fusion T12-L1 with lateral plate fixator interbody spacer solid bony union. Posterolateral rod and pedicle screw fixation L1-L2-L3-L4-S1 bilaterally with solid interbody bony bridging at all levels except L4-5 where there is an interbody spacer. The facet joints do appear fused at L4-5. Suspected prior graft harvest site from the right anterior iliac. Bridging spurring of the right SI joint. Bilateral hip prostheses. Remote right pubic ramus fractures with associated callus, also shown on 03/19/2023. Chondrocalcinosis of the pubic symphysis. Heterotopic ossification anterior to the right hip prosthesis. There is also some heterotopic ossification in the left  distal iliopsoas musculotendinous junction. IMPRESSION: 1. Indistinct airspace opacity in the posterior basal segment right lower lobe concerning for potential pneumonia. Consider follow up two view chest radiography to ensure complete clearance. 2. Trace right pleural effusion. 3. 3 mm right kidney upper pole nonobstructive renal calculus. 4. Sigmoid colon diverticulosis without findings of active diverticulitis. 5. Bilateral lower rib deformities compatible with prior fractures, the left lower rib deformities appear chronic in the right lower rib deformities are potentially late subacute or chronic. 6. Extensive postoperative findings in the lumbar spine without complicating feature. Bilateral hip prostheses. Old healed right pelvic fractures. 7.  Aortic Atherosclerosis (ICD10-I70.0). Electronically Signed   By: Ryan Salvage M.D.   On: 12/26/2023 12:19   DG Chest 2 View Result Date: 12/26/2023 CLINICAL DATA:  Fever. EXAM: CHEST - 2 VIEW COMPARISON:  11/08/2023. FINDINGS: Bilateral lung fields are clear. No dense consolidation or lung collapse. Bilateral costophrenic angles are clear. Elevated left hemidiaphragm noted. Stable cardio-mediastinal silhouette. No acute osseous abnormalities. Left reverse shoulder arthroplasty noted. The soft tissues are within normal limits. IMPRESSION: No active cardiopulmonary disease. Electronically Signed   By: Ree Molt M.D.   On: 12/26/2023 10:27   EKG: Independently reviewed. Sinus rhythm at 73 beats minute.  Nonspecific T wave changes.  Polymorphic PVCs noted.  Apparent prolonged repolarization versus dropped beats after PVCs.  Assessment/Plan Active Problems:   Asthma, moderate persistent   Paroxysmal atrial fibrillation (HCC)   Hyperlipidemia   GERD (gastroesophageal reflux disease) status post Nissen fundoplication   COPD (chronic obstructive pulmonary disease) (HCC)   Peripheral neuropathy   Chronic systolic heart failure (HCC)   Essential  (primary) hypertension   BPH (benign prostatic hyperplasia)   Chronic superficial venous thrombosis of both lower extremities   Depression due to physical illness   Pneumonia > Patient presenting with fever.  Found to have leukocytosis to 17.4.  Briefly tachycardic but this has resolved. > Imaging workup in the ED notable for possible right lower lobe pneumonia.   > Did have recent BKA.  Stump is mild erythema and questionable drainage.  Images reviewed by orthopedics on-call who spoke with EDP and do not feel this is likely to be driving the process. > Received vancomycin , cefepime , 500 cc IV fluid in the ED. - Monitor on telemetry overnight - Continue with vancomycin  and cefepime  as started in the ED - Check procalcitonin - Trend fever curve and WBC - Follow-up blood cultures, urinalysis - Hold off on further IV fluids given normal lactic acid, low normal to normal blood pressure and CHF  Recent osteomyelitis and right BKA > Some new drainage and erythema.  Orthopedics has reviewed this as above. - Continue with standard wound care, on antibiotics as above.  Hypertension - Continue home diltiazem , Imdur  -  Holding Lasix   Hyperlipidemia - Continue home atorvastatin   GERD - Continue home PPI  Atrial fibrillation - Continue home diltiazem  - Continue home Eliquis   Anemia - Hemoglobin stable at 12.6 - Trend CBC  Depression - Continue home duloxetine   Neuropathy - Continue home Lyrica   COPD/asthma - Replace home olodaterol with formulary Brovana  - Continue home as needed albuterol  - Continue chronic prednisone  10 mg twice daily  Chronic superficial VTE - Continue home Eliquis  as above  BPH - Continue home tamsulosin   Chronic combined systolic and diastolic CHF > Last echo was in October of last year with EF 40 to 45%, global hypokinesis, G1 DD, normal RV function. - Holding Lasix  as above - Strict I's and O's, daily weights   Left Shin abrasions > Presumably  secondary to tight fitting compression socks. - Hold off on compression socks for now, see how patient's swelling is doing - Consider holding compression socks longer term versus getting assistance with appropriate dressing to offload.  DVT prophylaxis: Eliquis   Code Status:   Full Family Communication:  Updated at bedside  Disposition Plan:   Patient is from:  Home  Anticipated DC to:  Home  Anticipated DC date:  2 to 4 days  Anticipated DC barriers: None  Consults called:  None, (EDP discussed with orthopedics on call by phone) Admission status:  Inpatient, telemetry  Severity of Illness: The appropriate patient status for this patient is INPATIENT. Inpatient status is judged to be reasonable and necessary in order to provide the required intensity of service to ensure the patient's safety. The patient's presenting symptoms, physical exam findings, and initial radiographic and laboratory data in the context of their chronic comorbidities is felt to place them at high risk for further clinical deterioration. Furthermore, it is not anticipated that the patient will be medically stable for discharge from the hospital within 2 midnights of admission.   * I certify that at the point of admission it is my clinical judgment that the patient will require inpatient hospital care spanning beyond 2 midnights from the point of admission due to high intensity of service, high risk for further deterioration and high frequency of surveillance required.DEWAINE Marsa KATHEE Seena MD [facility]  How to contact the [facility] Attending or Consulting provider 7A - 7P or covering provider during after hours 7P -7A, for this patient?   Check the care team in [facility] and look for a) attending/consulting [facility] provider listed and b) the [facility] team listed Log into [email or web address] and use [facility] universal password to access. If you do not have the password, please contact the hospital operator. Locate the [facility] provider  you are looking for under [facility] and page to a number that you can be directly reached. If you still have difficulty reaching the provider, please page the [facility] (Director on Call) for the Hospitalists listed on amion for assistance.  12/26/2023, 2:54 PM

## 2023-12-26 NOTE — Telephone Encounter (Signed)
 I spoke with his wife. Advised wife to take him to the ED ASAP.

## 2023-12-26 NOTE — ED Provider Notes (Signed)
 [place] EMERGENCY DEPARTMENT AT [facility] Provider Note   CSN: [id number] Arrival date & time: 12/26/23  9072     Patient presents with: Fever and Post-op Rt BKA   Levi [name] is a 78 y.o. male.    Fever  Presents with fever.  Patient states that he woke up with a fever this morning.  101 degrees Fahrenheit.  Patient states that because of this, came to ED for further evaluation.  Patient states that he endorses some chills.  Some may be some mild weakness.  Nonproductive cough very minimal in nature.  Started today.  Denies any chest pain.  No shortness of breath.  Endorses some slight right lower quad abdominal pain.  No nausea vomit diarrhea.  No obvious dysuria.   Per wife, no obvious new wounds to the left lower extremity.  They stated that they noticed some slight bleeding from the medial aspect of the incision site of the BKA.    Previous medical history reviewed : Patient was admitted back in July in the setting of DKA.  He presented due to sepsis in the setting of right lower extreme cellulitis and concerns for osteomyelitis.  Started on broad-spectrum antibiotics MRI foot confirmed osteomyelitis with Charcot degeneration.  Underwent right below-knee amputation on June 25.   Prior to Admission medications   Medication Sig Start Date End Date Taking? Authorizing Provider  acetaminophen  (TYLENOL ) 325 MG tablet Take 1-2 tablets (325-650 mg total) by mouth every 6 (six) hours as needed for mild pain (pain score 1-3) (or temp > 100.5). 12/05/23  Yes Love, Sharlet RAMAN, PA-C  albuterol  (VENTOLIN  HFA) 108 (90 Base) MCG/ACT inhaler INHALE 2 PUFFS BY MOUTH EVERY 4 HOURS AS NEEDED FOR WHEEZE OR FOR SHORTNESS OF BREATH 01/14/23  Yes Young, Clinton D, MD  apixaban  (ELIQUIS ) 5 MG TABS tablet Take 1 tablet (5 mg total) by mouth 2 (two) times daily. 12/05/23  Yes Love, Sharlet RAMAN, PA-C  atorvastatin  (LIPITOR) 20 MG tablet Take 1 tablet (20 mg total) by mouth daily. 12/03/23  Yes Jeffrie Oneil BROCKS, MD  cyclobenzaprine  (FLEXERIL ) 10 MG tablet Take 1 tablet (10 mg total) by mouth 3 (three) times daily as needed for muscle spasms. 09/04/23  Yes Curtis Debby PARAS, MD  diltiazem  (CARDIZEM  CD) 180 MG 24 hr capsule Take 1 capsule (180 mg total) by mouth daily. 08/25/23  Yes Jeffrie Oneil BROCKS, MD  DULoxetine  (CYMBALTA ) 30 MG capsule Take 1 capsule (30 mg total) by mouth daily. 12/15/23  Yes Curtis Debby PARAS, MD  feeding supplement (ENSURE PLUS HIGH PROTEIN) LIQD Take 237 mLs by mouth 2 (two) times daily between meals. 11/16/23  Yes Ghimire, Donalda HERO, MD  ferrous gluconate  (FERGON) 324 MG tablet Take 1 tablet (324 mg total) by mouth daily. 12/05/23  Yes Love, Sharlet RAMAN, PA-C  furosemide  (LASIX ) 40 MG tablet Take 0.5 tablets (20 mg total) by mouth daily. 12/05/23  Yes Love, Sharlet RAMAN, PA-C  hydrocerin (EUCERIN) CREA Apply 1 Application topically daily. To left foot 12/05/23  Yes Love, Sharlet RAMAN, PA-C  ipratropium-albuterol  (DUONEB) 0.5-2.5 (3) MG/3ML SOLN 1 NEBULE EVERY 6 HOURS AS NEEDED. **J45.3** 03/22/20  Yes Young, Reggy D, MD  isosorbide  mononitrate (IMDUR ) 30 MG 24 hr tablet Take 1 tablet (30 mg total) by mouth daily. 12/18/23  Yes Jeffrie Oneil BROCKS, MD  melatonin 5 MG TABS Take 1 tablet (5 mg total) by mouth at bedtime as needed. 12/05/23  Yes Love, Sharlet RAMAN, PA-C  Multiple Vitamin (  MULTIVITAMIN WITH MINERALS) TABS tablet Take 1 tablet by mouth daily. 12/05/23  Yes Love, Pamela S, PA-C  Olodaterol HCl (STRIVERDI RESPIMAT) 2.5 MCG/ACT AERS INHALE 2 PUFFS BY MOUTH INTO THE LUNGS DAILY 03/10/23  Yes Young, Reggy D, MD  ondansetron  (ZOFRAN -ODT) 8 MG disintegrating tablet Take 1 tablet (8 mg total) by mouth every 8 (eight) hours as needed for nausea. 04/07/23  Yes Curtis Debby PARAS, MD  oxyCODONE  (OXY IR/ROXICODONE ) 5 MG immediate release tablet Take 1-2 tablets (5-10 mg total) by mouth every 6 (six) hours as needed for severe pain (pain score 7-10) (5 mg for pain level 5-7 and 10 mg for pain >  level 7). 12/05/23  Yes Love, Sharlet RAMAN, PA-C  pantoprazole  (PROTONIX ) 40 MG tablet Take 1 tablet (40 mg total) by mouth 2 (two) times daily before a meal. 12/05/23  Yes Love, Sharlet RAMAN, PA-C  polyethylene glycol (MIRALAX  / GLYCOLAX ) 17 g packet Take 17 g by mouth daily. 11/17/23  Yes Ghimire, Donalda HERO, MD  potassium chloride  SA (KLOR-CON  M) 20 MEQ tablet Take 2 tablets (40 mEq total) by mouth daily. 12/05/23  Yes Love, Sharlet RAMAN, PA-C  predniSONE  (DELTASONE ) 5 MG tablet 3 tabs daily 12/25/23  Yes Young, Reggy D, MD  pregabalin  (LYRICA ) 100 MG capsule Take 1 capsule (100 mg total) by mouth 2 (two) times daily. 12/05/23  Yes Love, Sharlet RAMAN, PA-C  tamsulosin  (FLOMAX ) 0.4 MG CAPS capsule Take 1 capsule (0.4 mg total) by mouth at bedtime. 12/05/23  Yes Love, Sharlet RAMAN, PA-C    Allergies: Levaquin  [levofloxacin ]    Review of Systems  Constitutional:  Positive for fever.    Updated Vital Signs BP 127/73   Pulse 62   Temp 97.6 F (36.4 C) (Oral)   Resp 16   Ht 6' 2 (1.88 m)   Wt 86.2 kg   SpO2 100%   BMI 24.39 kg/m   Physical Exam Vitals and nursing note reviewed.  Constitutional:      General: He is not in acute distress.    Appearance: He is well-developed.  HENT:     Head: Normocephalic and atraumatic.  Eyes:     Conjunctiva/sclera: Conjunctivae normal.  Cardiovascular:     Rate and Rhythm: Normal rate and regular rhythm.     Heart sounds: No murmur heard. Pulmonary:     Effort: Pulmonary effort is normal. No respiratory distress.     Breath sounds: Normal breath sounds.  Abdominal:     Palpations: Abdomen is soft.     Tenderness: There is no abdominal tenderness.  Musculoskeletal:        General: No swelling.     Cervical back: Neck supple.     Comments: BKA right lower extremity. Medial aspect incision site shows mild erythema and maybe mild discharge.   Skin:    General: Skin is warm and dry.     Capillary Refill: Capillary refill takes less than 2 seconds.  Neurological:      Mental Status: He is alert.  Psychiatric:        Mood and Affect: Mood normal.         Media Information  Document Information  Photos    12/26/2023 10:32  Attached To:  Hospital Encounter on 12/26/23  Source Information  Simon Lavonia SAILOR, MD  [facility]     (all labs ordered are listed, but only abnormal results are displayed) Labs Reviewed  COMPREHENSIVE METABOLIC PANEL WITH GFR - Abnormal; Notable for the following components:  Result Value   Glucose, Bld 102 (*)    Total Protein 5.9 (*)    Albumin 3.2 (*)    All other components within normal limits  CBC WITH DIFFERENTIAL/PLATELET - Abnormal; Notable for the following components:   WBC 17.4 (*)    Hemoglobin 12.6 (*)    RDW 16.9 (*)    Neutro Abs 13.8 (*)    Monocytes Absolute 1.7 (*)    Abs Immature Granulocytes 0.39 (*)    Abs Granulocyte 13.8 (*)    All other components within normal limits  URINALYSIS, W/ REFLEX TO CULTURE (INFECTION SUSPECTED) - Abnormal; Notable for the following components:   Specific Gravity, Urine 1.044 (*)    All other components within normal limits  RESP PANEL BY RT-PCR (RSV, FLU A&B, COVID)  RVPGX2  CULTURE, BLOOD (ROUTINE X 2)  CULTURE, BLOOD (ROUTINE X 2)  MRSA NEXT GEN BY PCR, NASAL  PROCALCITONIN  I-STAT CG4 LACTIC ACID, ED    EKG: None  Radiology: CT KNEE RIGHT W CONTRAST Result Date: 12/26/2023 CLINICAL DATA:  Wound along BKA incision site, query abscess EXAM: CT OF THE RIGHT KNEE WITH CONTRAST TECHNIQUE: Multidetector CT imaging was performed following the standard protocol during bolus administration of intravenous contrast. RADIATION DOSE REDUCTION: This exam was performed according to the departmental dose-optimization program which includes automated exposure control, adjustment of the mA and/or kV according to patient size and/or use of iterative reconstruction technique. CONTRAST:  75mL OMNIPAQUE  IOHEXOL  350 MG/ML SOLN COMPARISON:  11/10/2023 FINDINGS:  Bones/Joint/Cartilage Below the knee amputation with sharp truncation of the tibia and fibula with mild marginal periostitis/osteoid, compatible with normal healing response. No evidence of osteomyelitis involving the tibia or fibula. Total knee prosthesis.  Small knee effusion. Ligaments Suboptimally assessed by CT. Muscles and Tendons Expected appearance of regional musculature given the below the knee amputation performed on 11/14/2023. No intramuscular abscess observed. Soft tissues Infiltrative edema signal in the subcutaneous tissues along the distal stump without abscess or drainable process identified. At this time. Atheromatous vascular calcifications. IMPRESSION: 1. Infiltrative edema signal in the subcutaneous tissues along the distal stump without abscess or drainable process identified. 2. No evidence of osteomyelitis involving the tibia or fibula. 3. Total knee prosthesis. Small knee effusion. 4. Atheromatous vascular calcifications. Electronically Signed   By: Ryan Salvage M.D.   On: 12/26/2023 14:10   CT CHEST W CONTRAST Result Date: 12/26/2023 EXAM: CT CHEST WITH CONTRAST 12/26/2023 01:33:00 PM TECHNIQUE: CT of the chest was performed with the administration of intravenous contrast. Multiplanar reformatted images are provided for review. Automated exposure control, iterative reconstruction, and/or weight based adjustment of the mA/kV was utilized to reduce the radiation dose to as low as reasonably achievable. CONTRAST: 75mL (iohexol  (OMNIPAQUE ) 350 MG/ML injection 75 mL IOHEXOL  350 MG/ML SOLN) COMPARISON: None available. CLINICAL HISTORY: Trying to better assess whether or not true airspace opacity. Fever; Post-op Rt BKA; CT KNEE RIGHT W CONTRAST; This imaging needs to include all the way to the BKA incision site. Eval for post op fluid collection. Pt came in via POV d/t this morning having a fever of 101 \\T \ since he had a Rt sided BKA 5 weeks ago he came in since he was told to get checked  out if anything like this happens. A/Ox4, rates post-op wound pain 6/10 during triage. Denies any drainage from the site \\T \ reports even when he had a wound vac in the hospital it did not drain much then either (per pt). FINDINGS: MEDIASTINUM:  Scattered coronary and aortic calcifications. Heart and pericardium are unremarkable. The central airways are clear. LYMPH NODES: No mediastinal, hilar or axillary lymphadenopathy. LUNGS AND PLEURA: Focal airspace consolidation in the posterior basal segment of the right lower lobe. Dependent atelectasis posteriorly at the left lung base. Trace right pleural effusion. No pneumothorax. SOFT TISSUES/BONES: Left shoulder arthroplasty. Anterior cervical fixation hardware. Spurring in the lower thoracic spine. Thoracolumbar fixation hardware, incompletely visualized distally. Right shoulder DJD. No acute abnormality of the soft tissues. UPPER ABDOMEN: Cholecystectomy clips. Left renal cyst, incompletely characterized. IMPRESSION: 1. Focal airspace consolidation in the posterior basal segment of the right lower lobe, possibly representing pneumonia. 2. Dependent atelectasis posteriorly at the left lung base. 3. Trace right pleural effusion. Electronically signed by: Katheleen Faes MD 12/26/2023 02:02 PM EDT RP Workstation: [id number]   CT FEMUR RIGHT W CONTRAST Result Date: 12/26/2023 CLINICAL DATA:  Erythema and questionable discharge along incisional site from right below the knee amputation formed on 11/14/2023 EXAM: CT OF THE LOWER RIGHT EXTREMITY WITH CONTRAST TECHNIQUE: Multidetector CT imaging of the lower right extremity was performed according to the standard protocol following intravenous contrast administration. RADIATION DOSE REDUCTION: This exam was performed according to the departmental dose-optimization program which includes automated exposure control, adjustment of the mA and/or kV according to patient size and/or use of iterative reconstruction technique.  CONTRAST:  75mL OMNIPAQUE  IOHEXOL  350 MG/ML SOLN COMPARISON:  None Available. FINDINGS: Bones/Joint/Cartilage Right total hip prosthesis. Total knee prosthesis. Mild heterotopic ossifications scattered anterior to the right hip prosthesis mainly along the joint and iliopsoas tendon and to a lesser extent extending towards the tensor fascia lata. Known old pubic ramus fractures. Femur otherwise unremarkable. Deformity anteriorly along the right iliac bone with some heterotopic calcification, possibly a site of prior bone graft harvesting. Small effusion in the suprapatellar bursa. Ligaments Suboptimally assessed by CT. Muscles and Tendons Minimal calcification in the mildly atrophic rectus femoris muscle laterally. Soft tissues Sciatic notch sciatic nerve unremarkable. Mild subcutaneous edema posteromedially along the distal thigh on image 289 series 4. Atheromatous vascular calcification of arterial structures noted. Please note that the knee joint was included but the distal tibia/fibula were is not included on today's CT of the femur. IMPRESSION: 1. Right total hip prosthesis and total knee prosthesis. 2. Mild heterotopic ossifications anterior to the right hip prosthesis mainly along the joint and iliopsoas tendon and to a lesser extent extending towards the tensor fascia lata. 3. Small effusion in the suprapatellar bursa. 4. Mild subcutaneous edema posteromedially along the distal thigh. 5. Atheromatous vascular calcification of arterial structures. 6. Please note that the knee joint was included but the distal tibia/fibula were not included on today's CT of the femur. 7. Known old pubic ramus fractures. 8. Deformity anteriorly along the right iliac bone with some heterotopic calcification, possibly a site of prior bone graft harvesting. Electronically Signed   By: Ryan Salvage M.D.   On: 12/26/2023 12:24   CT ABDOMEN PELVIS W CONTRAST Result Date: 12/26/2023 CLINICAL DATA:  Right lower quadrant  abdominal pain. EXAM: CT ABDOMEN AND PELVIS WITH CONTRAST TECHNIQUE: Multidetector CT imaging of the abdomen and pelvis was performed using the standard protocol following bolus administration of intravenous contrast. RADIATION DOSE REDUCTION: This exam was performed according to the departmental dose-optimization program which includes automated exposure control, adjustment of the mA and/or kV according to patient size and/or use of iterative reconstruction technique. CONTRAST:  75mL OMNIPAQUE  IOHEXOL  350 MG/ML SOLN COMPARISON:  CT pelvis 03/19/2023 and CT abdomen  09/25/2021 FINDINGS: Lower chest: Trace right pleural effusion. Atelectasis/volume loss posteriorly in the left lower lobe along the diaphragm. Indistinct airspace opacity in the posterior basal segment right lower lobe concerning for potential pneumonia. Hepatobiliary: Cholecystectomy. Common bile duct 1.1 cm in diameter, roughly similar compared to 09/25/2021, and likely reflecting a physiologic response to cholecystectomy. Pancreas: Unremarkable Spleen: Unremarkable Adrenals/Urinary Tract: Simple left renal cysts warrant no further imaging workup. 3 mm right kidney upper pole nonobstructive renal calculus. Partial obscuration of the urinary bladder and distal ureters due to streak artifact from the metal hardware. Stomach/Bowel: Sigmoid colon diverticulosis. Scattered diverticula of the descending colon. No findings of active diverticulitis. There is evidence of prior fundoplication in the proximal stomach. Vascular/Lymphatic: Atherosclerosis is present, including aortoiliac atherosclerotic disease. Reproductive: Unremarkable Other: No supplemental non-categorized findings. Musculoskeletal: Bilateral lower rib deformities compatible with prior fractures, the left lower rib deformities appear chronic in the right lower rib deformities are potentially late subacute or chronic. Solid interbody fusion T12-L1 with lateral plate fixator interbody spacer solid  bony union. Posterolateral rod and pedicle screw fixation L1-L2-L3-L4-S1 bilaterally with solid interbody bony bridging at all levels except L4-5 where there is an interbody spacer. The facet joints do appear fused at L4-5. Suspected prior graft harvest site from the right anterior iliac. Bridging spurring of the right SI joint. Bilateral hip prostheses. Remote right pubic ramus fractures with associated callus, also shown on 03/19/2023. Chondrocalcinosis of the pubic symphysis. Heterotopic ossification anterior to the right hip prosthesis. There is also some heterotopic ossification in the left distal iliopsoas musculotendinous junction. IMPRESSION: 1. Indistinct airspace opacity in the posterior basal segment right lower lobe concerning for potential pneumonia. Consider follow up two view chest radiography to ensure complete clearance. 2. Trace right pleural effusion. 3. 3 mm right kidney upper pole nonobstructive renal calculus. 4. Sigmoid colon diverticulosis without findings of active diverticulitis. 5. Bilateral lower rib deformities compatible with prior fractures, the left lower rib deformities appear chronic in the right lower rib deformities are potentially late subacute or chronic. 6. Extensive postoperative findings in the lumbar spine without complicating feature. Bilateral hip prostheses. Old healed right pelvic fractures. 7.  Aortic Atherosclerosis (ICD10-I70.0). Electronically Signed   By: Ryan Salvage M.D.   On: 12/26/2023 12:19   DG Chest 2 View Result Date: 12/26/2023 CLINICAL DATA:  Fever. EXAM: CHEST - 2 VIEW COMPARISON:  11/08/2023. FINDINGS: Bilateral lung fields are clear. No dense consolidation or lung collapse. Bilateral costophrenic angles are clear. Elevated left hemidiaphragm noted. Stable cardio-mediastinal silhouette. No acute osseous abnormalities. Left reverse shoulder arthroplasty noted. The soft tissues are within normal limits. IMPRESSION: No active cardiopulmonary  disease. Electronically Signed   By: Ree Molt M.D.   On: 12/26/2023 10:27     Procedures   Medications Ordered in the ED  vancomycin  (VANCOREADY) IVPB 2000 mg/400 mL (has no administration in time range)  atorvastatin  (LIPITOR) tablet 20 mg (has no administration in time range)  diltiazem  (CARDIZEM  CD) 24 hr capsule 180 mg (has no administration in time range)  isosorbide  mononitrate (IMDUR ) 24 hr tablet 30 mg (has no administration in time range)  DULoxetine  (CYMBALTA ) DR capsule 30 mg (has no administration in time range)  pantoprazole  (PROTONIX ) EC tablet 40 mg (has no administration in time range)  tamsulosin  (FLOMAX ) capsule 0.4 mg (has no administration in time range)  melatonin tablet 5 mg (has no administration in time range)  pregabalin  (LYRICA ) capsule 100 mg (has no administration in time range)  feeding supplement (ENSURE  PLUS HIGH PROTEIN) liquid 237 mL (has no administration in time range)  albuterol  (PROVENTIL ) (2.5 MG/3ML) 0.083% nebulizer solution 3 mL (has no administration in time range)  arformoterol  (BROVANA ) nebulizer solution 15 mcg (has no administration in time range)  sodium chloride  flush (NS) 0.9 % injection 3 mL (has no administration in time range)  acetaminophen  (TYLENOL ) tablet 650 mg (has no administration in time range)    Or  acetaminophen  (TYLENOL ) suppository 650 mg (has no administration in time range)  polyethylene glycol (MIRALAX  / GLYCOLAX ) packet 17 g (has no administration in time range)  ceFEPIme  (MAXIPIME ) 2 g in sodium chloride  0.9 % 100 mL IVPB (has no administration in time range)  vancomycin  (VANCOREADY) IVPB 1250 mg/250 mL (has no administration in time range)  apixaban  (ELIQUIS ) tablet 5 mg (has no administration in time range)  predniSONE  (DELTASONE ) tablet 10 mg (has no administration in time range)  iohexol  (OMNIPAQUE ) 350 MG/ML injection 75 mL (75 mLs Intravenous Contrast Given 12/26/23 1200)  ceFEPIme  (MAXIPIME ) 2 g in sodium  chloride 0.9 % 100 mL IVPB (2 g Intravenous New Bag/Given 12/26/23 1343)  sodium chloride  0.9 % bolus 500 mL (500 mLs Intravenous New Bag/Given 12/26/23 1342)  iohexol  (OMNIPAQUE ) 350 MG/ML injection 75 mL (75 mLs Intravenous Contrast Given 12/26/23 1335)    Clinical Course as of 12/26/23 1540  Fri Dec 26, 2023  1457 Dr. Seena  [TL]    Clinical Course User Index [TL] Simon Lavonia SAILOR, MD                                 Medical Decision Making Amount and/or Complexity of Data Reviewed Labs: ordered. Radiology: ordered.  Risk Prescription drug management. Decision regarding hospitalization.    Presents with fever.  Patient states that he woke up with a fever this morning.  101 degrees Fahrenheit.  Patient states that because of this, came to ED for further evaluation.  Patient states that he endorses some chills.  Some may be some mild weakness.  Nonproductive cough very minimal in nature.  Started today.  Denies any chest pain.  No shortness of breath.  Endorses some slight right lower quad abdominal pain.  No nausea vomit diarrhea.  No obvious dysuria.   Per wife, no obvious new wounds to the left lower extremity.  They stated that they noticed some slight bleeding from the medial aspect of the incision site of the BKA.    Previous medical history reviewed : Patient was admitted back in July in the setting of DKA.  He presented due to sepsis in the setting of right lower extreme cellulitis and concerns for osteomyelitis.  Started on broad-spectrum antibiotics MRI foot confirmed osteomyelitis with Charcot degeneration.  Underwent right below-knee amputation on June 25.   On exam here, patient slightly tachycardic.  Afebrile.  Normotensive.   Exam showed maybe some slight erythema and discharge along the medial aspect of the incision site right lower extremity of the BKA.  No obvious fluid pocket palpated.  CT scan of the extremity did not show anyfluid pocket.  No abscess.  Spoke to  orthopedic surgery regarding the patient.  Do not think this area is driving patient's fever as well as leukocytosis.  No further intervention needed.   I did obtain CT abdomen pelvis.  CT abdomen pelvis did not show any evidence of appendicitis.  Did show possible infiltrate in the lower lung.  Therefore repeated  CT scan to assess for any kind of obvious pneumonia.  CT scan of the chest does show concerns for pneumonia.  Given recent hospital stay, will cover with vancomycin  and cefepime  for hospital-acquired pneumonia.   Otherwise  no other skin breakdown.  No other source of infection.       Final diagnoses:  Pneumonia due to infectious organism, unspecified laterality, unspecified part of lung  Fever, unspecified fever cause    ED Discharge Orders     None          Simon Lavonia SAILOR, MD 12/26/23 1540

## 2023-12-26 NOTE — ED Triage Notes (Signed)
 Pt came in via POV d/t this morning having a fever of 101 & since he had a Rt sided BKA 5 weeks ago he came in since he was told to get checked out if anything like this happens. A/Ox4, rates post-op wound pain 6/10 during triage. Denies any drainage from the site & reports even when he had a wound vac in the hospital it did not drain much then either (per pt).

## 2023-12-27 DIAGNOSIS — J189 Pneumonia, unspecified organism: Secondary | ICD-10-CM | POA: Diagnosis not present

## 2023-12-27 LAB — CBC
HCT: 34 % — ABNORMAL LOW (ref 39.0–52.0)
Hemoglobin: 10.9 g/dL — ABNORMAL LOW (ref 13.0–17.0)
MCH: 29.7 pg (ref 26.0–34.0)
MCHC: 32.1 g/dL (ref 30.0–36.0)
MCV: 92.6 fL (ref 80.0–100.0)
Platelets: 121 K/uL — ABNORMAL LOW (ref 150–400)
RBC: 3.67 MIL/uL — ABNORMAL LOW (ref 4.22–5.81)
RDW: 16.9 % — ABNORMAL HIGH (ref 11.5–15.5)
WBC: 10.9 K/uL — ABNORMAL HIGH (ref 4.0–10.5)
nRBC: 0 % (ref 0.0–0.2)

## 2023-12-27 LAB — COMPREHENSIVE METABOLIC PANEL WITH GFR
ALT: 13 U/L (ref 0–44)
AST: 10 U/L — ABNORMAL LOW (ref 15–41)
Albumin: 2.4 g/dL — ABNORMAL LOW (ref 3.5–5.0)
Alkaline Phosphatase: 61 U/L (ref 38–126)
Anion gap: 10 (ref 5–15)
BUN: 14 mg/dL (ref 8–23)
CO2: 21 mmol/L — ABNORMAL LOW (ref 22–32)
Calcium: 8.6 mg/dL — ABNORMAL LOW (ref 8.9–10.3)
Chloride: 106 mmol/L (ref 98–111)
Creatinine, Ser: 0.64 mg/dL (ref 0.61–1.24)
GFR, Estimated: >60 mL/min (ref >60)
Glucose, Bld: 83 mg/dL (ref 70–99)
Potassium: 3.4 mmol/L — ABNORMAL LOW (ref 3.5–5.1)
Sodium: 137 mmol/L (ref 135–145)
Total Bilirubin: 0.9 mg/dL (ref 0.0–1.2)
Total Protein: 4.9 g/dL — ABNORMAL LOW (ref 6.5–8.1)

## 2023-12-27 LAB — PROCALCITONIN: Procalcitonin: <0.1 ng/mL

## 2023-12-27 MED ORDER — POTASSIUM CHLORIDE 20 MEQ PO PACK
40.0000 meq | PACK | Freq: Once | ORAL | Status: AC
  Administered 2023-12-27: 40 meq via ORAL
  Filled 2023-12-27: qty 2

## 2023-12-27 NOTE — Progress Notes (Signed)
 Wife brought patient's slide board from home, wife would like it noted that slide board reads  hospital use only but this is the patient's slide board from a previous admission.

## 2023-12-27 NOTE — Progress Notes (Signed)
 PROGRESS NOTE    HA PLACERES  FMW:[id number] DOB: 05/21/1946 DOA: 12/26/2023 PCP: Curtis Debby PARAS, MD   Brief Narrative: This 78 yrs old male with PMH significant for hypertension, hyperlipidemia, GERD, atrial fibrillation, anemia, chronic systolic CHF, depression, neuropathy, COPD/asthma, chronic superficial VTE, BPH presented in the ED with fever. He was admitted from 6/14-6/22 for right foot osteomyelitis for which he was treated with antibiotics and then underwent right BKA. Subsequently went to inpatient rehab and was discharged from there on 7/11. He followed up with orthopedic surgery on 7/17 and had staples removed.  Patient woke up with fever of 101.8 F associated with chills yesterday.  Patient also reports mild weakness,  mild lower quadrant abdominal pain.  He also reports discharge from the stump wound.  His PCP is advised him to come to the ED for further evaluation. Workup in the ED reveals leukocytosis, lactic acid normal.  Chest x-ray no acute abnormality.  CT chest showed focal airspace consolidation in the right lobe possibly representing pneumonia. Patient was admitted for further evaluation and started on empiric antibiotics.  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 Imaging notable for possible right lower lobe pneumonia. Patient has recent right BKA, Stump is mildly erythematous  and has discharge. Images reviewed by orthopedics on-call does not feel this is likely the reason for fever. Initiated on empiric antibiotics  (vancomycin  and cefepime  ). Procalcitonin 0.16 Follow-up blood cultures, UA unremarkable. Hold off on further IV fluids given normal lactic acid,  hx. of CHF   Recent osteomyelitis and right BKA: Noted to have some drainage and erythema.   Orthopedics has reviewed this as above. Continue with standard wound care, antibiotics as above.   Hypertension: Continue diltiazem , Imdur  Holding Lasix .   Hyperlipidemia: Continue home atorvastatin .   GERD: Continue home PPI.   Atrial fibrillation: HR well controlled. Continue home diltiazem . Continue home Eliquis .   Anemia: Hemoglobin stable at 12.6. Trend CBC   Depression Continue duloxetine .   Neuropathy: Continue Lyrica .   COPD/asthma: Replace home olodaterol with formulary Brovana . Continue as needed albuterol . Continue chronic prednisone  10 mg twice daily   Chronic superficial VTE Continue home Eliquis  as above.   BPH: Continue tamsulosin .   Chronic combined systolic and diastolic CHF Last echo was in October of last year with EF 40 to 45%, global hypokinesis, G1 DD, normal RV function. Holding Lasix  as above Strict I's and O's, daily weights.   Left Shin abrasions: Presumably secondary to tight fitting compression socks. Hold off on compression socks for now, see how patient's swelling is doing Consider holding compression socks longer term versus getting assistance with appropriate dressing to offload.  DVT prophylaxis: Eliquis  Code Status: Full code Family Communication: No family at bed side Disposition Plan:    Status is: Inpatient Remains inpatient appropriate because: Admitted for possible pneumonia.   Consultants:  None  Procedures: CT C/A/P  Antimicrobials:  Anti-infectives (From admission, onward)    Start  Dose/Rate Route Frequency Ordered Stop    12/27/23 0300  vancomycin  (VANCOREADY) IVPB 1250 mg/250 mL        1,250 mg 166.7 mL/hr over 90 Minutes Intravenous Every 12 hours 12/26/23 1510     12/26/23 1430  vancomycin  (VANCOCIN ) IVPB 1000 mg/200 mL premix  Status:  Discontinued        1,000 mg 200 mL/hr over 60 Minutes Intravenous  Once 12/26/23 1424 12/26/23 1428   12/26/23 1430  vancomycin  (VANCOREADY) IVPB 2000 mg/400 mL        2,000 mg 200 mL/hr over 120 Minutes Intravenous  Once 12/26/23 1428 12/26/23 1952   12/26/23 1400  ceFEPIme  (MAXIPIME ) 2 g in sodium chloride  0.9 % 100 mL IVPB        2 g 200 mL/hr over 30 Minutes Intravenous Every 8 hours 12/26/23 1503     12/26/23 1300  ceFEPIme  (MAXIPIME ) 2 g in sodium chloride  0.9 % 100 mL IVPB        2 g 200 mL/hr over 30 Minutes Intravenous  Once 12/26/23 1254 12/26/23 1413      Subjective: Patient was seen and examined at bedside.  Overnight events noted. Patient appears much improved. He denies any fever,  denies any cough,  chest pain or shortness of breath.  Objective: Vitals:   12/27/23 0432 12/27/23 0500 12/27/23 0717 12/27/23 0851  BP: 138/66  127/73   Pulse: 67  65 66  Resp: 16  17 16   Temp: 98.2 F (36.8 C)  97.7 F (36.5 C)   TempSrc: Oral  Oral   SpO2: 97%  97% 97%  Weight:  83.2 kg    Height:        Intake/Output Summary (Last 24 hours) at 12/27/2023 1032 Last data filed at 12/27/2023 0500 Gross per 24 hour  Intake 600 ml  Output 325 ml  Net 275 ml   Filed Weights   12/26/23 0943 12/27/23 0500  Weight: 86.2 kg 83.2 kg    Examination:  General exam: Appears calm and comfortable, deconditioned, not in any acute distress. Respiratory system: CTA Bilaterally . Respiratory effort normal.  RR 16. Cardiovascular system: S1 & S2 heard, RRR. No JVD, murmurs, rubs, gallops or clicks.  Gastrointestinal system: Abdomen is non distended, soft and non tender.  Normal bowel sounds heard. Central nervous system: Alert and oriented x 3. No focal neurological  deficits. Extremities: No edema, no cyanosis, no clubbing,  status post right BKA Skin: No rashes, lesions or ulcers Psychiatry: Judgement and insight appear normal. Mood & affect appropriate.    Data Reviewed: I have personally reviewed following labs and imaging studies  CBC: Recent Labs  Lab 12/26/23 0951 12/27/23 0800  WBC 17.4* 10.9*  NEUTROABS 13.8*  --   HGB 12.6* 10.9*  HCT 40.3 34.0*  MCV 92.6 92.6  PLT 151 121*   Basic Metabolic Panel: Recent Labs  Lab 12/26/23 0951 12/27/23 0800  NA 138 137  K 3.7 3.4*  CL 103 106  CO2 25 21*  GLUCOSE 102* 83  BUN 14 14  CREATININE 0.87 0.64  CALCIUM  8.9 8.6*   GFR: Estimated Creatinine Clearance: 91.3 mL/min (by C-G formula based on SCr of 0.64 mg/dL). Liver Function Tests: Recent Labs  Lab 12/26/23 0951 12/27/23 0800  AST 15 10*  ALT 16 13  ALKPHOS 77 61  BILITOT 0.9 0.9  PROT 5.9* 4.9*  ALBUMIN 3.2* 2.4*   No results for input(s): LIPASE, AMYLASE in the last 168 hours. No results for  input(s): AMMONIA in the last 168 hours. Coagulation Profile: No results for input(s): INR, PROTIME in the last 168 hours. Cardiac Enzymes: No results for input(s): CKTOTAL, CKMB, CKMBINDEX, TROPONINI in the last 168 hours. BNP (last 3 results) No results for input(s): PROBNP in the last 8760 hours. HbA1C: No results for input(s): HGBA1C in the last 72 hours. CBG: No results for input(s): GLUCAP in the last 168 hours. Lipid Profile: No results for input(s): CHOL, HDL, LDLCALC, TRIG, CHOLHDL, LDLDIRECT in the last 72 hours. Thyroid  Function Tests: No results for input(s): TSH, T4TOTAL, FREET4, T3FREE, THYROIDAB in the last 72 hours. Anemia Panel: No results for input(s): VITAMINB12, FOLATE, FERRITIN, TIBC, IRON , RETICCTPCT in the last 72 hours. Sepsis Labs: Recent Labs  Lab 12/26/23 0955 12/26/23 1758  PROCALCITON  --  0.16  LATICACIDVEN 1.5  --     Recent  Results (from the past 240 hours)  Resp panel by RT-PCR (RSV, Flu A&B, Covid) Anterior Nasal Swab     Status: None   Collection Time: 12/26/23 10:39 AM   Specimen: Anterior Nasal Swab  Result Value Ref Range Status   SARS Coronavirus 2 by RT PCR NEGATIVE NEGATIVE Final   Influenza A by PCR NEGATIVE NEGATIVE Final   Influenza B by PCR NEGATIVE NEGATIVE Final    Comment: (NOTE) The Xpert Xpress SARS-CoV-2/FLU/RSV plus assay is intended as an aid in the diagnosis of influenza from Nasopharyngeal swab specimens and should not be used as a sole basis for treatment. Nasal washings and aspirates are unacceptable for Xpert Xpress SARS-CoV-2/FLU/RSV testing.  Fact Sheet for Patients: BloggerCourse.com  Fact Sheet for Healthcare Providers: SeriousBroker.it  This test is not yet approved or cleared by the [place]  FDA and has been authorized for detection and/or diagnosis of SARS-CoV-2 by FDA under an Emergency Use Authorization (EUA). This EUA will remain in effect (meaning this test can be used) for the duration of the COVID-19 declaration under Section 564(b)(1) of the Act, 21 U.S.C. section 360bbb-3(b)(1), unless the authorization is terminated or revoked.     Resp Syncytial Virus by PCR NEGATIVE NEGATIVE Final    Comment: (NOTE) Fact Sheet for Patients: BloggerCourse.com  Fact Sheet for Healthcare Providers: SeriousBroker.it  This test is not yet approved or cleared by the [place]  FDA and has been authorized for detection and/or diagnosis of SARS-CoV-2 by FDA under an Emergency Use Authorization (EUA). This EUA will remain in effect (meaning this test can be used) for the duration of the COVID-19 declaration under Section 564(b)(1) of the Act, 21 U.S.C. section 360bbb-3(b)(1), unless the authorization is terminated or revoked.  Performed at [facility], [address] [place], [place] [address]   Blood culture (routine x 2)     Status: None (Preliminary result)   Collection Time: 12/26/23 11:03 AM   Specimen: BLOOD  Result Value Ref Range Status   Specimen Description BLOOD RIGHT ANTECUBITAL  Final   Special Requests   Final    BOTTLES DRAWN AEROBIC AND ANAEROBIC Blood Culture adequate volume   Culture   Final    NO GROWTH < 24 HOURS Performed at [facility], [address], [place], [place] [address]    Report Status PENDING  Incomplete  Blood culture (routine x 2)     Status: None (Preliminary result)   Collection Time: 12/26/23 11:05 AM   Specimen: BLOOD RIGHT HAND  Result Value Ref Range Status   Specimen Description BLOOD RIGHT HAND  Final  Special Requests   Final    BOTTLES DRAWN AEROBIC AND ANAEROBIC Blood Culture adequate volume   Culture   Final    NO GROWTH < 24 HOURS Performed at [facility], [address] [place], [place] [address]    Report Status PENDING  Incomplete    Radiology Studies: CT KNEE RIGHT W CONTRAST Result Date: 12/26/2023 CLINICAL DATA:  Wound along BKA incision site, query abscess EXAM: CT OF THE RIGHT KNEE WITH CONTRAST TECHNIQUE: Multidetector CT imaging was performed following the standard protocol during bolus administration of intravenous contrast. RADIATION DOSE REDUCTION: This exam was performed according to the departmental dose-optimization program which includes automated exposure control, adjustment of the mA and/or kV according to patient size and/or use of iterative reconstruction technique. CONTRAST:  75mL OMNIPAQUE  IOHEXOL  350 MG/ML SOLN COMPARISON:  11/10/2023 FINDINGS: Bones/Joint/Cartilage Below the knee amputation with sharp truncation of the tibia and fibula with mild marginal periostitis/osteoid, compatible with normal healing response. No evidence of osteomyelitis involving the tibia or fibula. Total knee prosthesis.  Small knee effusion. Ligaments Suboptimally assessed by CT.  Muscles and Tendons Expected appearance of regional musculature given the below the knee amputation performed on 11/14/2023. No intramuscular abscess observed. Soft tissues Infiltrative edema signal in the subcutaneous tissues along the distal stump without abscess or drainable process identified. At this time. Atheromatous vascular calcifications. IMPRESSION: 1. Infiltrative edema signal in the subcutaneous tissues along the distal stump without abscess or drainable process identified. 2. No evidence of osteomyelitis involving the tibia or fibula. 3. Total knee prosthesis. Small knee effusion. 4. Atheromatous vascular calcifications. Electronically Signed   By: Ryan Salvage M.D.   On: 12/26/2023 14:10   CT CHEST W CONTRAST Result Date: 12/26/2023 EXAM: CT CHEST WITH CONTRAST 12/26/2023 01:33:00 PM TECHNIQUE: CT of the chest was performed with the administration of intravenous contrast. Multiplanar reformatted images are provided for review. Automated exposure control, iterative reconstruction, and/or weight based adjustment of the mA/kV was utilized to reduce the radiation dose to as low as reasonably achievable. CONTRAST: 75mL (iohexol  (OMNIPAQUE ) 350 MG/ML injection 75 mL IOHEXOL  350 MG/ML SOLN) COMPARISON: None available. CLINICAL HISTORY: Trying to better assess whether or not true airspace opacity. Fever; Post-op Rt BKA; CT KNEE RIGHT W CONTRAST; This imaging needs to include all the way to the BKA incision site. Eval for post op fluid collection. Pt came in via POV d/t this morning having a fever of 101 \\T \ since he had a Rt sided BKA 5 weeks ago he came in since he was told to get checked out if anything like this happens. A/Ox4, rates post-op wound pain 6/10 during triage. Denies any drainage from the site \\T \ reports even when he had a wound vac in the hospital it did not drain much then either (per pt). FINDINGS: MEDIASTINUM: Scattered coronary and aortic calcifications. Heart and pericardium are  unremarkable. The central airways are clear. LYMPH NODES: No mediastinal, hilar or axillary lymphadenopathy. LUNGS AND PLEURA: Focal airspace consolidation in the posterior basal segment of the right lower lobe. Dependent atelectasis posteriorly at the left lung base. Trace right pleural effusion. No pneumothorax. SOFT TISSUES/BONES: Left shoulder arthroplasty. Anterior cervical fixation hardware. Spurring in the lower thoracic spine. Thoracolumbar fixation hardware, incompletely visualized distally. Right shoulder DJD. No acute abnormality of the soft tissues. UPPER ABDOMEN: Cholecystectomy clips. Left renal cyst, incompletely characterized. IMPRESSION: 1. Focal airspace consolidation in the posterior basal segment of the right lower lobe, possibly representing pneumonia. 2. Dependent atelectasis posteriorly  at the left lung base. 3. Trace right pleural effusion. Electronically signed by: Katheleen Faes MD 12/26/2023 02:02 PM EDT RP Workstation: [id number]   CT FEMUR RIGHT W CONTRAST Result Date: 12/26/2023 CLINICAL DATA:  Erythema and questionable discharge along incisional site from right below the knee amputation formed on 11/14/2023 EXAM: CT OF THE LOWER RIGHT EXTREMITY WITH CONTRAST TECHNIQUE: Multidetector CT imaging of the lower right extremity was performed according to the standard protocol following intravenous contrast administration. RADIATION DOSE REDUCTION: This exam was performed according to the departmental dose-optimization program which includes automated exposure control, adjustment of the mA and/or kV according to patient size and/or use of iterative reconstruction technique. CONTRAST:  75mL OMNIPAQUE  IOHEXOL  350 MG/ML SOLN COMPARISON:  None Available. FINDINGS: Bones/Joint/Cartilage Right total hip prosthesis. Total knee prosthesis. Mild heterotopic ossifications scattered anterior to the right hip prosthesis mainly along the joint and iliopsoas tendon and to a lesser extent extending towards  the tensor fascia lata. Known old pubic ramus fractures. Femur otherwise unremarkable. Deformity anteriorly along the right iliac bone with some heterotopic calcification, possibly a site of prior bone graft harvesting. Small effusion in the suprapatellar bursa. Ligaments Suboptimally assessed by CT. Muscles and Tendons Minimal calcification in the mildly atrophic rectus femoris muscle laterally. Soft tissues Sciatic notch sciatic nerve unremarkable. Mild subcutaneous edema posteromedially along the distal thigh on image 289 series 4. Atheromatous vascular calcification of arterial structures noted. Please note that the knee joint was included but the distal tibia/fibula were is not included on today's CT of the femur. IMPRESSION: 1. Right total hip prosthesis and total knee prosthesis. 2. Mild heterotopic ossifications anterior to the right hip prosthesis mainly along the joint and iliopsoas tendon and to a lesser extent extending towards the tensor fascia lata. 3. Small effusion in the suprapatellar bursa. 4. Mild subcutaneous edema posteromedially along the distal thigh. 5. Atheromatous vascular calcification of arterial structures. 6. Please note that the knee joint was included but the distal tibia/fibula were not included on today's CT of the femur. 7. Known old pubic ramus fractures. 8. Deformity anteriorly along the right iliac bone with some heterotopic calcification, possibly a site of prior bone graft harvesting. Electronically Signed   By: Ryan Salvage M.D.   On: 12/26/2023 12:24   CT ABDOMEN PELVIS W CONTRAST Result Date: 12/26/2023 CLINICAL DATA:  Right lower quadrant abdominal pain. EXAM: CT ABDOMEN AND PELVIS WITH CONTRAST TECHNIQUE: Multidetector CT imaging of the abdomen and pelvis was performed using the standard protocol following bolus administration of intravenous contrast. RADIATION DOSE REDUCTION: This exam was performed according to the departmental dose-optimization program which  includes automated exposure control, adjustment of the mA and/or kV according to patient size and/or use of iterative reconstruction technique. CONTRAST:  75mL OMNIPAQUE  IOHEXOL  350 MG/ML SOLN COMPARISON:  CT pelvis 03/19/2023 and CT abdomen 09/25/2021 FINDINGS: Lower chest: Trace right pleural effusion. Atelectasis/volume loss posteriorly in the left lower lobe along the diaphragm. Indistinct airspace opacity in the posterior basal segment right lower lobe concerning for potential pneumonia. Hepatobiliary: Cholecystectomy. Common bile duct 1.1 cm in diameter, roughly similar compared to 09/25/2021, and likely reflecting a physiologic response to cholecystectomy. Pancreas: Unremarkable Spleen: Unremarkable Adrenals/Urinary Tract: Simple left renal cysts warrant no further imaging workup. 3 mm right kidney upper pole nonobstructive renal calculus. Partial obscuration of the urinary bladder and distal ureters due to streak artifact from the metal hardware. Stomach/Bowel: Sigmoid colon diverticulosis. Scattered diverticula of the descending colon. No findings of active diverticulitis. There is evidence  of prior fundoplication in the proximal stomach. Vascular/Lymphatic: Atherosclerosis is present, including aortoiliac atherosclerotic disease. Reproductive: Unremarkable Other: No supplemental non-categorized findings. Musculoskeletal: Bilateral lower rib deformities compatible with prior fractures, the left lower rib deformities appear chronic in the right lower rib deformities are potentially late subacute or chronic. Solid interbody fusion T12-L1 with lateral plate fixator interbody spacer solid bony union. Posterolateral rod and pedicle screw fixation L1-L2-L3-L4-S1 bilaterally with solid interbody bony bridging at all levels except L4-5 where there is an interbody spacer. The facet joints do appear fused at L4-5. Suspected prior graft harvest site from the right anterior iliac. Bridging spurring of the right SI joint.  Bilateral hip prostheses. Remote right pubic ramus fractures with associated callus, also shown on 03/19/2023. Chondrocalcinosis of the pubic symphysis. Heterotopic ossification anterior to the right hip prosthesis. There is also some heterotopic ossification in the left distal iliopsoas musculotendinous junction. IMPRESSION: 1. Indistinct airspace opacity in the posterior basal segment right lower lobe concerning for potential pneumonia. Consider follow up two view chest radiography to ensure complete clearance. 2. Trace right pleural effusion. 3. 3 mm right kidney upper pole nonobstructive renal calculus. 4. Sigmoid colon diverticulosis without findings of active diverticulitis. 5. Bilateral lower rib deformities compatible with prior fractures, the left lower rib deformities appear chronic in the right lower rib deformities are potentially late subacute or chronic. 6. Extensive postoperative findings in the lumbar spine without complicating feature. Bilateral hip prostheses. Old healed right pelvic fractures. 7.  Aortic Atherosclerosis (ICD10-I70.0). Electronically Signed   By: Ryan Salvage M.D.   On: 12/26/2023 12:19   DG Chest 2 View Result Date: 12/26/2023 CLINICAL DATA:  Fever. EXAM: CHEST - 2 VIEW COMPARISON:  11/08/2023. FINDINGS: Bilateral lung fields are clear. No dense consolidation or lung collapse. Bilateral costophrenic angles are clear. Elevated left hemidiaphragm noted. Stable cardio-mediastinal silhouette. No acute osseous abnormalities. Left reverse shoulder arthroplasty noted. The soft tissues are within normal limits. IMPRESSION: No active cardiopulmonary disease. Electronically Signed   By: Ree Molt M.D.   On: 12/26/2023 10:27   Scheduled Meds:  apixaban   5 mg Oral BID   arformoterol   15 mcg Nebulization BID   atorvastatin   20 mg Oral Daily   diltiazem   180 mg Oral Daily   DULoxetine   30 mg Oral Daily   feeding supplement  237 mL Oral BID BM   isosorbide  mononitrate  30  mg Oral Daily   pantoprazole   40 mg Oral BID AC   predniSONE   10 mg Oral BID WC   pregabalin   100 mg Oral BID   sodium chloride  flush  3 mL Intravenous Q12H   tamsulosin   0.4 mg Oral QHS   Continuous Infusions:  ceFEPime  (MAXIPIME ) IV 2 g (12/27/23 0648)   vancomycin  1,250 mg (12/27/23 0428)     LOS: 1 day    Time spent: 50 mins    Darcel Dawley, MD [facility]   If 7PM-7AM, please contact night-coverage

## 2023-12-28 ENCOUNTER — Encounter: Payer: Self-pay | Admitting: Internal Medicine

## 2023-12-28 DIAGNOSIS — J189 Pneumonia, unspecified organism: Secondary | ICD-10-CM | POA: Diagnosis not present

## 2023-12-28 LAB — BASIC METABOLIC PANEL WITH GFR
Anion gap: 9 (ref 5–15)
BUN: 12 mg/dL (ref 8–23)
CO2: 21 mmol/L — ABNORMAL LOW (ref 22–32)
Calcium: 8.6 mg/dL — ABNORMAL LOW (ref 8.9–10.3)
Chloride: 110 mmol/L (ref 98–111)
Creatinine, Ser: 0.6 mg/dL — ABNORMAL LOW (ref 0.61–1.24)
GFR, Estimated: >60 mL/min (ref >60)
Glucose, Bld: 112 mg/dL — ABNORMAL HIGH (ref 70–99)
Potassium: 3.5 mmol/L (ref 3.5–5.1)
Sodium: 140 mmol/L (ref 135–145)

## 2023-12-28 LAB — CBC
HCT: 34.6 % — ABNORMAL LOW (ref 39.0–52.0)
Hemoglobin: 10.8 g/dL — ABNORMAL LOW (ref 13.0–17.0)
MCH: 28.9 pg (ref 26.0–34.0)
MCHC: 31.2 g/dL (ref 30.0–36.0)
MCV: 92.5 fL (ref 80.0–100.0)
Platelets: 119 K/uL — ABNORMAL LOW (ref 150–400)
RBC: 3.74 MIL/uL — ABNORMAL LOW (ref 4.22–5.81)
RDW: 16.8 % — ABNORMAL HIGH (ref 11.5–15.5)
WBC: 8.7 K/uL (ref 4.0–10.5)
nRBC: 0 % (ref 0.0–0.2)

## 2023-12-28 LAB — PHOSPHORUS: Phosphorus: 3.4 mg/dL (ref 2.5–4.6)

## 2023-12-28 LAB — MAGNESIUM: Magnesium: 1.8 mg/dL (ref 1.7–2.4)

## 2023-12-28 MED ORDER — POLYETHYLENE GLYCOL 3350 17 G PO PACK
17.0000 g | PACK | Freq: Every day | ORAL | Status: DC
  Filled 2023-12-28 (×2): qty 1

## 2023-12-28 MED ORDER — SODIUM CHLORIDE 0.9 % IV SOLN
2.0000 g | INTRAVENOUS | Status: DC
  Administered 2023-12-28: 2 g via INTRAVENOUS
  Filled 2023-12-28: qty 20

## 2023-12-28 MED ORDER — FERROUS GLUCONATE 324 (38 FE) MG PO TABS
324.0000 mg | ORAL_TABLET | Freq: Every day | ORAL | Status: DC
Start: 2023-12-28 — End: 2023-12-29
  Administered 2023-12-28 – 2023-12-29 (×2): 324 mg via ORAL
  Filled 2023-12-28 (×2): qty 1

## 2023-12-28 MED ORDER — MAGNESIUM SULFATE 2 GM/50ML IV SOLN
2.0000 g | Freq: Once | INTRAVENOUS | Status: AC
  Administered 2023-12-28: 2 g via INTRAVENOUS
  Filled 2023-12-28: qty 50

## 2023-12-28 MED ORDER — AZITHROMYCIN 250 MG PO TABS
500.0000 mg | ORAL_TABLET | Freq: Every day | ORAL | Status: DC
  Administered 2023-12-28 – 2023-12-29 (×2): 500 mg via ORAL
  Filled 2023-12-28 (×2): qty 2

## 2023-12-28 NOTE — Progress Notes (Signed)
 PROGRESS NOTE    KAYSIN BROCK  FMW:[id number] DOB: 08-Jul-1945 DOA: 12/26/2023 PCP: Curtis Debby PARAS, MD   Brief Narrative: This 78 yrs old male with PMH significant for hypertension, hyperlipidemia, GERD, atrial fibrillation, anemia, chronic systolic CHF, depression, neuropathy, COPD/asthma, chronic superficial VTE, BPH presented in the ED with fever. He was admitted from 6/14-6/22 for right foot osteomyelitis for which he was treated with antibiotics and then underwent right BKA. Subsequently went to inpatient rehab and was discharged from there on 7/11. He followed up with orthopedic surgery on 7/17 and had staples removed.  Patient woke up with fever of 101.8 F associated with chills yesterday.  Patient also reports mild weakness,  mild lower quadrant abdominal pain.  He also reports discharge from the stump wound.  His PCP is advised him to come to the ED for further evaluation. Workup in the ED reveals leukocytosis, lactic acid normal.  Chest x-ray no acute abnormality.  CT chest showed focal airspace consolidation in the right lobe possibly representing pneumonia. Patient was admitted for further evaluation and started on empiric antibiotics.  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 Imaging notable for possible right lower lobe pneumonia. Procalcitonin 0.16 Recent right BKA, On presentation Stump  is mildly erythematous with discharge. Images reviewed by on-call orthopedics who did not feel SSI reason for infection  Initiated on empiric antibiotics  (vancomycin  and cefepime  ). Bcx NG, UA unremarkable.   Plan:  Deescalate antibiotics to ceftriaxone  and azithromycin   Likely home tomorrow AM, if no further fever  Recent osteomyelitis s/p Right BKA: Noted to have some drainage and erythema at surgical site on  arrival.  Orthopedics reviewed  Continue with standard wound care, antibiotics as above.   Hypertension: Continue diltiazem , Imdur  Holding Lasix .   Hyperlipidemia: Continue home atorvastatin .   GERD: Continue home PPI.   Atrial fibrillation: HR well controlled. Continue home diltiazem . Continue home Eliquis .   Normocytic anemia  H/o Iron  deficiency  Hemoglobin stable at 12.6. Iron /TIBC/Ferritin/ %Sat    Component Value Date/Time   IRON  34 (L) 06/17/2023 1129   TIBC 381 06/17/2023 1129   FERRITIN 41 06/17/2023 1129   IRONPCTSAT 9 (L) 06/17/2023 1129   Resume home oral iron  supplementation  Re-check iron  panel   Depression Continue duloxetine .   Neuropathy: Continue Lyrica .   COPD/asthma: Replace home olodaterol with formulary Brovana . Continue as needed albuterol . Continue chronic prednisone  10 mg twice daily   Chronic superficial VTE Continue home Eliquis  as above.   BPH: Continue tamsulosin .   Chronic combined systolic and diastolic CHF Last echo was in 89/7975 with EF 40 to 45%, global hypokinesis, G1 DD, normal RV function. Holding Lasix  as above, given appears euvolemic  Strict I's and O's, daily weights.   Left Shin abrasions: Presumably secondary to tight fitting compression socks. Hold compression socks for now, see how patient's swelling is doing Consider holding compression socks longer term   DVT prophylaxis: Eliquis  Code Status: Full code Family Communication: No  family at bed side, discussed plan with patient and he noted  understanding  Disposition Plan:    Status is: Inpatient Remains inpatient appropriate because: Admitted for possible pneumonia.   Consultants:  None  Procedures: CT C/A/P  Antimicrobials:  Anti-infectives (From admission, onward)    Start     Dose/Rate Route Frequency Ordered Stop   12/27/23 0300  vancomycin  (VANCOREADY) IVPB 1250 mg/250 mL  Status:  Discontinued        1,250 mg 166.7 mL/hr over 90 Minutes Intravenous Every 12 hours 12/26/23 1510 12/28/23 0748   12/26/23 1430  vancomycin  (VANCOCIN ) IVPB 1000 mg/200 mL premix  Status:  Discontinued        1,000 mg 200 mL/hr over 60 Minutes Intravenous  Once 12/26/23 1424 12/26/23 1428   12/26/23 1430  vancomycin  (VANCOREADY) IVPB 2000 mg/400 mL        2,000 mg 200 mL/hr over 120 Minutes Intravenous  Once 12/26/23 1428 12/26/23 1952   12/26/23 1400  ceFEPIme  (MAXIPIME ) 2 g in sodium chloride  0.9 % 100 mL IVPB        2 g 200 mL/hr over 30 Minutes Intravenous Every 8 hours 12/26/23 1503     12/26/23 1300  ceFEPIme  (MAXIPIME ) 2 g in sodium chloride  0.9 % 100 mL IVPB        2 g 200 mL/hr over 30 Minutes Intravenous  Once 12/26/23 1254 12/26/23 1413      Subjective: Denies cough or shortness of breath. States he did have a cough prior to admission.   Objective: Vitals:   12/27/23 1926 12/27/23 2025 12/28/23 0512 12/28/23 0726  BP:  125/62 (!) 141/73 (!) 155/71  Pulse:  (!) 43 60 67  Resp:  18 17 18   Temp:  98 F (36.7 C) 97.6 F (36.4 C) (!) 97.5 F (36.4 C)  TempSrc:  Oral Oral Oral  SpO2: 95% 96% 98% 99%  Weight:      Height:        Intake/Output Summary (Last 24 hours) at 12/28/2023 0900 Last data filed at 12/28/2023 0700 Gross per 24 hour  Intake 1270 ml  Output 1850 ml  Net -580 ml   Filed Weights   12/26/23 0943 12/27/23 0500  Weight: 86.2 kg 83.2 kg    Examination:   Constitutional: In no distress.  Cardiovascular: Normal rate, regular rhythm. No lower extremity edema  Pulmonary: Non labored breathing  on room air, no wheezing or rales.   Abdominal: Soft. Normal bowel sounds. Non distended and non tender Musculoskeletal: s/p R BKA, in cision site with no purulent drainage, no dehiscence, no surrounding erythema. Small abrasion  at lateral aspect   Neurological: Alert and oriented to person, place, and time. Non focal  Skin: Skin is warm and dry.    Data Reviewed: I have personally reviewed following labs and imaging studies  CBC: Recent Labs  Lab 12/26/23 0951 12/27/23 0800 12/28/23 0606  WBC 17.4* 10.9* 8.7  NEUTROABS 13.8*  --   --   HGB 12.6* 10.9* 10.8*  HCT 40.3 34.0* 34.6*  MCV 92.6 92.6 92.5  PLT 151 121* 119*   Basic Metabolic Panel: Recent Labs  Lab 12/26/23 0951 12/27/23 0800 12/28/23 0606  NA 138 137 140  K 3.7 3.4* 3.5  CL 103 106 110  CO2 25 21* 21*  GLUCOSE 102* 83 112*  BUN 14 14 12   CREATININE 0.87 0.64 0.60*  CALCIUM  8.9 8.6* 8.6*  MG  --   --  1.8  PHOS  --   --  3.4   GFR: Estimated Creatinine Clearance: 91.3 mL/min (A) (by C-G formula based on SCr of 0.6 mg/dL (L)). Liver Function Tests: Recent Labs  Lab 12/26/23 0951 12/27/23 0800  AST 15 10*  ALT 16 13  ALKPHOS 77 61  BILITOT 0.9 0.9  PROT 5.9* 4.9*  ALBUMIN 3.2* 2.4*    Sepsis Labs: Recent Labs  Lab 12/26/23 0955 12/26/23 1758 12/27/23 0800  PROCALCITON  --  0.16 <0.10  LATICACIDVEN 1.5  --   --     Recent Results (from the past 240 hours)  Resp panel by RT-PCR (RSV, Flu A&B, Covid) Anterior Nasal Swab     Status: None   Collection Time: 12/26/23 10:39 AM   Specimen: Anterior Nasal Swab  Result Value Ref Range Status   SARS Coronavirus 2 by RT PCR NEGATIVE NEGATIVE Final   Influenza A by PCR NEGATIVE NEGATIVE Final   Influenza B by PCR NEGATIVE NEGATIVE Final    Comment: (NOTE) The Xpert Xpress SARS-CoV-2/FLU/RSV plus assay is intended as an aid in the diagnosis of influenza from Nasopharyngeal swab specimens and should not be used as a sole basis for treatment. Nasal  washings and aspirates are unacceptable for Xpert Xpress SARS-CoV-2/FLU/RSV testing.  Fact Sheet for Patients: BloggerCourse.com  Fact Sheet for Healthcare Providers: SeriousBroker.it  This test is not yet approved or cleared by the [place]  FDA and has been authorized for detection and/or diagnosis of SARS-CoV-2 by FDA under an Emergency Use Authorization (EUA). This EUA will remain in effect (meaning this test can be used) for the duration of the COVID-19 declaration under Section 564(b)(1) of the Act, 21 U.S.C. section 360bbb-3(b)(1), unless the authorization is terminated or revoked.     Resp Syncytial Virus by PCR NEGATIVE NEGATIVE Final    Comment: (NOTE) Fact Sheet for Patients: BloggerCourse.com  Fact Sheet for Healthcare Providers: SeriousBroker.it  This test is not yet approved or cleared by the [place]  FDA and has been authorized for detection and/or diagnosis of SARS-CoV-2 by FDA under an Emergency Use Authorization (EUA). This EUA will remain in effect (meaning this test can be used) for the duration of the COVID-19 declaration under Section 564(b)(1) of the Act, 21 U.S.C. section 360bbb-3(b)(1), unless the authorization is terminated or revoked.  Performed at [facility], [address] [place], [place] [address]   Blood culture (routine x 2)     Status: None (Preliminary result)   Collection Time: 12/26/23 11:03 AM   Specimen: BLOOD  Result Value Ref Range Status   Specimen Description BLOOD RIGHT ANTECUBITAL  Final   Special Requests   Final    BOTTLES DRAWN AEROBIC AND ANAEROBIC Blood Culture adequate volume   Culture   Final    NO GROWTH < 24 HOURS Performed at [facility], [address] [place], [place] [address]    Report Status PENDING  Incomplete  Blood culture (routine x 2)     Status: None (Preliminary result)    Collection Time: 12/26/23 11:05 AM   Specimen: BLOOD RIGHT HAND  Result Value Ref Range Status   Specimen Description BLOOD RIGHT HAND  Final   Special Requests   Final    BOTTLES DRAWN AEROBIC AND ANAEROBIC Blood Culture adequate volume   Culture   Final    NO GROWTH < 24 HOURS Performed at [facility], [address], [place], [place] [address]    Report Status PENDING  Incomplete    Radiology Studies: CT  KNEE RIGHT W CONTRAST Result Date: 12/26/2023 CLINICAL DATA:  Wound along BKA incision site, query abscess EXAM: CT OF THE RIGHT KNEE WITH CONTRAST TECHNIQUE: Multidetector CT imaging was performed following the standard protocol during bolus administration of intravenous contrast. RADIATION DOSE REDUCTION: This exam was performed according to the departmental dose-optimization program which includes automated exposure control, adjustment of the mA and/or kV according to patient size and/or use of iterative reconstruction technique. CONTRAST:  75mL OMNIPAQUE  IOHEXOL  350 MG/ML SOLN COMPARISON:  11/10/2023 FINDINGS: Bones/Joint/Cartilage Below the knee amputation with sharp truncation of the tibia and fibula with mild marginal periostitis/osteoid, compatible with normal healing response. No evidence of osteomyelitis involving the tibia or fibula. Total knee prosthesis.  Small knee effusion. Ligaments Suboptimally assessed by CT. Muscles and Tendons Expected appearance of regional musculature given the below the knee amputation performed on 11/14/2023. No intramuscular abscess observed. Soft tissues Infiltrative edema signal in the subcutaneous tissues along the distal stump without abscess or drainable process identified. At this time. Atheromatous vascular calcifications. IMPRESSION: 1. Infiltrative edema signal in the subcutaneous tissues along the distal stump without abscess or drainable process identified. 2. No evidence of osteomyelitis involving the tibia or fibula. 3. Total knee  prosthesis. Small knee effusion. 4. Atheromatous vascular calcifications. Electronically Signed   By: Ryan Salvage M.D.   On: 12/26/2023 14:10   CT CHEST W CONTRAST Result Date: 12/26/2023 EXAM: CT CHEST WITH CONTRAST 12/26/2023 01:33:00 PM TECHNIQUE: CT of the chest was performed with the administration of intravenous contrast. Multiplanar reformatted images are provided for review. Automated exposure control, iterative reconstruction, and/or weight based adjustment of the mA/kV was utilized to reduce the radiation dose to as low as reasonably achievable. CONTRAST: 75mL (iohexol  (OMNIPAQUE ) 350 MG/ML injection 75 mL IOHEXOL  350 MG/ML SOLN) COMPARISON: None available. CLINICAL HISTORY: Trying to better assess whether or not true airspace opacity. Fever; Post-op Rt BKA; CT KNEE RIGHT W CONTRAST; This imaging needs to include all the way to the BKA incision site. Eval for post op fluid collection. Pt came in via POV d/t this morning having a fever of 101 \\T \ since he had a Rt sided BKA 5 weeks ago he came in since he was told to get checked out if anything like this happens. A/Ox4, rates post-op wound pain 6/10 during triage. Denies any drainage from the site \\T \ reports even when he had a wound vac in the hospital it did not drain much then either (per pt). FINDINGS: MEDIASTINUM: Scattered coronary and aortic calcifications. Heart and pericardium are unremarkable. The central airways are clear. LYMPH NODES: No mediastinal, hilar or axillary lymphadenopathy. LUNGS AND PLEURA: Focal airspace consolidation in the posterior basal segment of the right lower lobe. Dependent atelectasis posteriorly at the left lung base. Trace right pleural effusion. No pneumothorax. SOFT TISSUES/BONES: Left shoulder arthroplasty. Anterior cervical fixation hardware. Spurring in the lower thoracic spine. Thoracolumbar fixation hardware, incompletely visualized distally. Right shoulder DJD. No acute abnormality of the soft tissues.  UPPER ABDOMEN: Cholecystectomy clips. Left renal cyst, incompletely characterized. IMPRESSION: 1. Focal airspace consolidation in the posterior basal segment of the right lower lobe, possibly representing pneumonia. 2. Dependent atelectasis posteriorly at the left lung base. 3. Trace right pleural effusion. Electronically signed by: Katheleen Faes MD 12/26/2023 02:02 PM EDT RP Workstation: [id number]   CT FEMUR RIGHT W CONTRAST Result Date: 12/26/2023 CLINICAL DATA:  Erythema and questionable discharge along incisional site from right below the knee amputation formed on 11/14/2023 EXAM: CT OF THE LOWER RIGHT  EXTREMITY WITH CONTRAST TECHNIQUE: Multidetector CT imaging of the lower right extremity was performed according to the standard protocol following intravenous contrast administration. RADIATION DOSE REDUCTION: This exam was performed according to the departmental dose-optimization program which includes automated exposure control, adjustment of the mA and/or kV according to patient size and/or use of iterative reconstruction technique. CONTRAST:  75mL OMNIPAQUE  IOHEXOL  350 MG/ML SOLN COMPARISON:  None Available. FINDINGS: Bones/Joint/Cartilage Right total hip prosthesis. Total knee prosthesis. Mild heterotopic ossifications scattered anterior to the right hip prosthesis mainly along the joint and iliopsoas tendon and to a lesser extent extending towards the tensor fascia lata. Known old pubic ramus fractures. Femur otherwise unremarkable. Deformity anteriorly along the right iliac bone with some heterotopic calcification, possibly a site of prior bone graft harvesting. Small effusion in the suprapatellar bursa. Ligaments Suboptimally assessed by CT. Muscles and Tendons Minimal calcification in the mildly atrophic rectus femoris muscle laterally. Soft tissues Sciatic notch sciatic nerve unremarkable. Mild subcutaneous edema posteromedially along the distal thigh on image 289 series 4. Atheromatous vascular  calcification of arterial structures noted. Please note that the knee joint was included but the distal tibia/fibula were is not included on today's CT of the femur. IMPRESSION: 1. Right total hip prosthesis and total knee prosthesis. 2. Mild heterotopic ossifications anterior to the right hip prosthesis mainly along the joint and iliopsoas tendon and to a lesser extent extending towards the tensor fascia lata. 3. Small effusion in the suprapatellar bursa. 4. Mild subcutaneous edema posteromedially along the distal thigh. 5. Atheromatous vascular calcification of arterial structures. 6. Please note that the knee joint was included but the distal tibia/fibula were not included on today's CT of the femur. 7. Known old pubic ramus fractures. 8. Deformity anteriorly along the right iliac bone with some heterotopic calcification, possibly a site of prior bone graft harvesting. Electronically Signed   By: Ryan Salvage M.D.   On: 12/26/2023 12:24   CT ABDOMEN PELVIS W CONTRAST Result Date: 12/26/2023 CLINICAL DATA:  Right lower quadrant abdominal pain. EXAM: CT ABDOMEN AND PELVIS WITH CONTRAST TECHNIQUE: Multidetector CT imaging of the abdomen and pelvis was performed using the standard protocol following bolus administration of intravenous contrast. RADIATION DOSE REDUCTION: This exam was performed according to the departmental dose-optimization program which includes automated exposure control, adjustment of the mA and/or kV according to patient size and/or use of iterative reconstruction technique. CONTRAST:  75mL OMNIPAQUE  IOHEXOL  350 MG/ML SOLN COMPARISON:  CT pelvis 03/19/2023 and CT abdomen 09/25/2021 FINDINGS: Lower chest: Trace right pleural effusion. Atelectasis/volume loss posteriorly in the left lower lobe along the diaphragm. Indistinct airspace opacity in the posterior basal segment right lower lobe concerning for potential pneumonia. Hepatobiliary: Cholecystectomy. Common bile duct 1.1 cm in diameter,  roughly similar compared to 09/25/2021, and likely reflecting a physiologic response to cholecystectomy. Pancreas: Unremarkable Spleen: Unremarkable Adrenals/Urinary Tract: Simple left renal cysts warrant no further imaging workup. 3 mm right kidney upper pole nonobstructive renal calculus. Partial obscuration of the urinary bladder and distal ureters due to streak artifact from the metal hardware. Stomach/Bowel: Sigmoid colon diverticulosis. Scattered diverticula of the descending colon. No findings of active diverticulitis. There is evidence of prior fundoplication in the proximal stomach. Vascular/Lymphatic: Atherosclerosis is present, including aortoiliac atherosclerotic disease. Reproductive: Unremarkable Other: No supplemental non-categorized findings. Musculoskeletal: Bilateral lower rib deformities compatible with prior fractures, the left lower rib deformities appear chronic in the right lower rib deformities are potentially late subacute or chronic. Solid interbody fusion T12-L1 with lateral plate fixator  interbody spacer solid bony union. Posterolateral rod and pedicle screw fixation L1-L2-L3-L4-S1 bilaterally with solid interbody bony bridging at all levels except L4-5 where there is an interbody spacer. The facet joints do appear fused at L4-5. Suspected prior graft harvest site from the right anterior iliac. Bridging spurring of the right SI joint. Bilateral hip prostheses. Remote right pubic ramus fractures with associated callus, also shown on 03/19/2023. Chondrocalcinosis of the pubic symphysis. Heterotopic ossification anterior to the right hip prosthesis. There is also some heterotopic ossification in the left distal iliopsoas musculotendinous junction. IMPRESSION: 1. Indistinct airspace opacity in the posterior basal segment right lower lobe concerning for potential pneumonia. Consider follow up two view chest radiography to ensure complete clearance. 2. Trace right pleural effusion. 3. 3 mm right  kidney upper pole nonobstructive renal calculus. 4. Sigmoid colon diverticulosis without findings of active diverticulitis. 5. Bilateral lower rib deformities compatible with prior fractures, the left lower rib deformities appear chronic in the right lower rib deformities are potentially late subacute or chronic. 6. Extensive postoperative findings in the lumbar spine without complicating feature. Bilateral hip prostheses. Old healed right pelvic fractures. 7.  Aortic Atherosclerosis (ICD10-I70.0). Electronically Signed   By: Ryan Salvage M.D.   On: 12/26/2023 12:19   DG Chest 2 View Result Date: 12/26/2023 CLINICAL DATA:  Fever. EXAM: CHEST - 2 VIEW COMPARISON:  11/08/2023. FINDINGS: Bilateral lung fields are clear. No dense consolidation or lung collapse. Bilateral costophrenic angles are clear. Elevated left hemidiaphragm noted. Stable cardio-mediastinal silhouette. No acute osseous abnormalities. Left reverse shoulder arthroplasty noted. The soft tissues are within normal limits. IMPRESSION: No active cardiopulmonary disease. Electronically Signed   By: Ree Molt M.D.   On: 12/26/2023 10:27   Scheduled Meds:  apixaban   5 mg Oral BID   arformoterol   15 mcg Nebulization BID   atorvastatin   20 mg Oral Daily   diltiazem   180 mg Oral Daily   DULoxetine   30 mg Oral Daily   feeding supplement  237 mL Oral BID BM   isosorbide  mononitrate  30 mg Oral Daily   pantoprazole   40 mg Oral BID AC   predniSONE   10 mg Oral BID WC   pregabalin   100 mg Oral BID   sodium chloride  flush  3 mL Intravenous Q12H   tamsulosin   0.4 mg Oral QHS   Continuous Infusions:  ceFEPime  (MAXIPIME ) IV 2 g (12/28/23 0532)   magnesium  sulfate bolus IVPB 2 g (12/28/23 0825)     LOS: 2 days    Time spent: 35 mins    Alban Pepper, MD [facility]   If 7PM-7AM, please contact night-coverage

## 2023-12-28 NOTE — Plan of Care (Signed)
  Problem: Pain Managment: Goal: General experience of comfort will improve and/or be controlled Outcome: Progressing   Problem: Safety: Goal: Ability to remain free from injury will improve Outcome: Progressing   Problem: Skin Integrity: Goal: Risk for impaired skin integrity will decrease Outcome: Progressing

## 2023-12-29 ENCOUNTER — Other Ambulatory Visit: Payer: Self-pay | Admitting: Podiatry

## 2023-12-29 DIAGNOSIS — L03115 Cellulitis of right lower limb: Secondary | ICD-10-CM | POA: Diagnosis not present

## 2023-12-29 LAB — BASIC METABOLIC PANEL WITH GFR
Anion gap: 8 (ref 5–15)
BUN: 11 mg/dL (ref 8–23)
CO2: 23 mmol/L (ref 22–32)
Calcium: 8.5 mg/dL — ABNORMAL LOW (ref 8.9–10.3)
Chloride: 109 mmol/L (ref 98–111)
Creatinine, Ser: 0.63 mg/dL (ref 0.61–1.24)
GFR, Estimated: >60 mL/min (ref >60)
Glucose, Bld: 81 mg/dL (ref 70–99)
Potassium: 3.2 mmol/L — ABNORMAL LOW (ref 3.5–5.1)
Sodium: 140 mmol/L (ref 135–145)

## 2023-12-29 LAB — CBC
HCT: 36.3 % — ABNORMAL LOW (ref 39.0–52.0)
Hemoglobin: 11.5 g/dL — ABNORMAL LOW (ref 13.0–17.0)
MCH: 29.3 pg (ref 26.0–34.0)
MCHC: 31.7 g/dL (ref 30.0–36.0)
MCV: 92.4 fL (ref 80.0–100.0)
Platelets: 141 K/uL — ABNORMAL LOW (ref 150–400)
RBC: 3.93 MIL/uL — ABNORMAL LOW (ref 4.22–5.81)
RDW: 16.6 % — ABNORMAL HIGH (ref 11.5–15.5)
WBC: 8.3 K/uL (ref 4.0–10.5)
nRBC: 0 % (ref 0.0–0.2)

## 2023-12-29 LAB — IRON AND TIBC
Iron: 28 ug/dL — ABNORMAL LOW (ref 45–182)
Saturation Ratios: 10 % — ABNORMAL LOW (ref 17.9–39.5)
TIBC: 283 ug/dL (ref 250–450)
UIBC: 255 ug/dL

## 2023-12-29 LAB — FERRITIN: Ferritin: 101 ng/mL (ref 24–336)

## 2023-12-29 MED ORDER — CEPHALEXIN 500 MG PO CAPS: 500.0000 mg | ORAL_CAPSULE | Freq: Two times a day (BID) | ORAL | 0 refills | Status: AC

## 2023-12-29 MED ORDER — POTASSIUM CHLORIDE 20 MEQ PO PACK
40.0000 meq | PACK | Freq: Once | ORAL | Status: AC
  Administered 2023-12-29: 40 meq via ORAL
  Filled 2023-12-29: qty 2

## 2023-12-29 MED ORDER — FUROSEMIDE 40 MG PO TABS: 20.0000 mg | ORAL_TABLET | Freq: Every day | ORAL | Status: DC | PRN

## 2023-12-29 NOTE — Discharge Instructions (Signed)
 Please follow up with your primary care doctor about how long you should take potassium.   You will need to continue your oral iron  and have your iron  labs repeat with your primary care physician.

## 2023-12-29 NOTE — Progress Notes (Signed)
 PATIENT DISCHARGE NOTE  Discharge instructions were reviewed with the patient and his spouse. Both parties demonstrated verbal understanding of the discharge instructions that were provided. Printed copy of the instructions were also provided to the patient and his spouse.   At this time of discharge, the patient is alert and oriented to person, place, time and situation. Patient IV was removed and gauze and tape dressing was applied. Patient vital signs are stable and patient has no complaint nor any signs or symptoms of distress or complications.   Patient will be transported to the main entrance via wheelchair, where he will further be transported home by his spouse in a private vehicle.   Leonor FORBES Clarks, BSN, RN 12/29/23 2:11 PM

## 2023-12-29 NOTE — Discharge Summary (Signed)
 Physician Discharge Summary   Patient: Levi [name] MRN: [id number] DOB: 1945/09/05  Admit date:     12/26/2023  Discharge date: 12/29/2023  Discharge Physician: Alban Pepper   PCP: Curtis Debby PARAS, MD   Recommendations at discharge:    F/u with PCP about oral potassium  Will need iron  labs rechecked  Discuss with PCP if SGLTi option for volume management instead of lasix    Discharge Diagnoses: Principal Problem:   MIld Cellulitis of residual R limb at BKA site Active Problems:   Asthma, moderate persistent   Paroxysmal atrial fibrillation (HCC)   Hyperlipidemia   GERD (gastroesophageal reflux disease) status post Nissen fundoplication   COPD (chronic obstructive pulmonary disease) (HCC)   Peripheral neuropathy   Chronic systolic heart failure (HCC)   Essential (primary) hypertension   BPH (benign prostatic hyperplasia)   Chronic superficial venous thrombosis of both lower extremities   Depression due to physical illness  Resolved Problems:   * No resolved hospital problems. [facility] Course: This 78 yrs old male with PMH significant for hypertension, hyperlipidemia, GERD, atrial fibrillation, anemia, chronic systolic CHF, depression, neuropathy, COPD/asthma, chronic superficial VTE, BPH presented in the ED with fever. He was admitted from 6/14-6/22 for right foot osteomyelitis for which he was treated with antibiotics and then underwent right BKA. Subsequently went to inpatient rehab and was discharged from there on 7/11. He followed up with orthopedic surgery on 7/17 and had staples removed.  Patient woke up with fever of 101.8 F associated with chills yesterday.  Patient also reports mild weakness,  mild lower quadrant abdominal pain.  He also reports discharge from the stump wound.  His PCP is advised him to come to the ED for further evaluation. Workup in the ED reveals leukocytosis, lactic acid normal.  Chest x-ray no acute abnormality.  CT chest showed focal  airspace consolidation in the right lobe possibly representing pneumonia. Patient was admitted for further evaluation and started on empiric antibiotics.  He was treated for HCAP then antibiotics were de-escalated to cap coverage. Patient's procalcitonin was 0.16 then repeat <0.10. Given patient had only mild cough. Antibiotics for pneumonia were stopped.   Patient was discharged on keflex  for mild cellulitis of residual limb.   Assessment and Plan:  Mild cellulitis at residual limb Recent osteo s/p R BKA\ Suspect fever and leukocytosis result of mild cellulitis at residual limb. Noted to have some drainage and erythema at incision on arrival.  On discharge erythema improved and no drainage noted. Imaging not c/f osteo of this area.   Patient will continue keflex  for 7d course  Possible PNA Patient presented with fever at home (no fever during hospitalization), and leukocytosis. CT chest showed possible RLL consolidation. Procal 0.16>> <0.10.  He had only a mild cough on presentation.  He was treated for HCAP.   Leukocytosis resolved and he never had fever in the hospital.   Antibiotics were completely stopped for this given patient procal and mild symptoms.    HTN:  Dilt and imdur  continued Lasix  was resumed on discharge.    Hypokalemia Supplemented. On supplementation at home.   GERD  Noted   Afib  HR well controlled  Dilt and eliquis  continued.   HLD Atorva continued.   Normocytic anemia  Iron /TIBC/Ferritin/ %Sat    Component Value Date/Time   IRON  28 (L) 12/29/2023 0707   TIBC 283 12/29/2023 0707   FERRITIN 101 12/29/2023 0707   IRONPCTSAT 10 (L) 12/29/2023 0707   IRONPCTSAT 9 (L)  06/17/2023 1129   Continued on home oral iron .  Will f/u with Pcp regarding this  Depression Home duloxetine  was continued.   COPD asthma  Not in acute exacerbation  Continued home prednisone  and inhalers  BPH Home flomax  continued   Chronic superficial VTE  On home  eliquis  for paf    Chronic combined systolic and diastolic CHF  Compensated.  Discharged on home lasix .    L shin abrasions.  Discussed with patient to continue to hold compression stockings. If compression needed would put cushioned bandaged over abrasions to prevent further breakdown.           Consultants: None Procedures performed: NOne  Disposition: Home Diet recommendation:  Discharge Diet Orders (From admission, onward)     Start     Ordered   12/29/23 0000  Diet - low sodium heart healthy        12/29/23 1201           Cardiac diet DISCHARGE MEDICATION: Allergies as of 12/29/2023       Reactions   Levaquin  [levofloxacin ] Itching   Tolerated ciprofloxacin  in 2022.        Medication List     TAKE these medications    acetaminophen  325 MG tablet Commonly known as: TYLENOL  Take 1-2 tablets (325-650 mg total) by mouth every 6 (six) hours as needed for mild pain (pain score 1-3) (or temp > 100.5).   albuterol  108 (90 Base) MCG/ACT inhaler Commonly known as: VENTOLIN  HFA INHALE 2 PUFFS BY MOUTH EVERY 4 HOURS AS NEEDED FOR WHEEZE OR FOR SHORTNESS OF BREATH   atorvastatin  20 MG tablet Commonly known as: LIPITOR Take 1 tablet (20 mg total) by mouth daily.   cephALEXin  500 MG capsule Commonly known as: KEFLEX  Take 1 capsule (500 mg total) by mouth every 12 (twelve) hours for 7 days. Start taking on: December 30, 2023   cyclobenzaprine  10 MG tablet Commonly known as: FLEXERIL  Take 1 tablet (10 mg total) by mouth 3 (three) times daily as needed for muscle spasms.   diltiazem  180 MG 24 hr capsule Commonly known as: Cardizem  CD Take 1 capsule (180 mg total) by mouth daily.   DULoxetine  30 MG capsule Commonly known as: Cymbalta  Take 1 capsule (30 mg total) by mouth daily.   Eliquis  5 MG Tabs tablet Generic drug: apixaban  Take 1 tablet (5 mg total) by mouth 2 (two) times daily.   feeding supplement Liqd Take 237 mLs by mouth 2 (two) times daily  between meals.   ferrous gluconate  324 MG tablet Commonly known as: FERGON Take 1 tablet (324 mg total) by mouth daily.   furosemide  40 MG tablet Commonly known as: LASIX  Take 0.5 tablets (20 mg total) by mouth daily as needed for edema. What changed:  when to take this reasons to take this   hydrocerin Crea Apply 1 Application topically daily. To left foot   ipratropium-albuterol  0.5-2.5 (3) MG/3ML Soln Commonly known as: DUONEB 1 NEBULE EVERY 6 HOURS AS NEEDED. **J45.3**   isosorbide  mononitrate 30 MG 24 hr tablet Commonly known as: IMDUR  Take 1 tablet (30 mg total) by mouth daily.   melatonin 5 MG Tabs Take 1 tablet (5 mg total) by mouth at bedtime as needed.   multivitamin with minerals Tabs tablet Take 1 tablet by mouth daily.   ondansetron  8 MG disintegrating tablet Commonly known as: ZOFRAN -ODT Take 1 tablet (8 mg total) by mouth every 8 (eight) hours as needed for nausea.   oxyCODONE  5 MG  immediate release tablet Commonly known as: Oxy IR/ROXICODONE  Take 1-2 tablets (5-10 mg total) by mouth every 6 (six) hours as needed for severe pain (pain score 7-10) (5 mg for pain level 5-7 and 10 mg for pain > level 7).   pantoprazole  40 MG tablet Commonly known as: PROTONIX  Take 1 tablet (40 mg total) by mouth 2 (two) times daily before a meal.   polyethylene glycol 17 g packet Commonly known as: MIRALAX  / GLYCOLAX  Take 17 g by mouth daily.   potassium chloride  SA 20 MEQ tablet Commonly known as: KLOR-CON  M Take 2 tablets (40 mEq total) by mouth daily.   predniSONE  5 MG tablet Commonly known as: DELTASONE  3 tabs daily   pregabalin  100 MG capsule Commonly known as: Lyrica  Take 1 capsule (100 mg total) by mouth 2 (two) times daily.   Striverdi Respimat 2.5 MCG/ACT Aers Generic drug: Olodaterol HCl INHALE 2 PUFFS BY MOUTH INTO THE LUNGS DAILY   tamsulosin  0.4 MG Caps capsule Commonly known as: FLOMAX  Take 1 capsule (0.4 mg total) by mouth at bedtime.         Follow-up Information     Curtis Debby PARAS, MD Follow up.   Specialties: Family Medicine, Sports Medicine, Radiology Contact information: [phone] [address] [place] [address] [phone]                Discharge Exam: Fredricka Weights   12/26/23 0943 12/27/23 0500  Weight: 86.2 kg 83.2 kg   Physical Exam  Constitutional: In no distress.  Cardiovascular: Normal rate, regular rhythm. No lower extremity edema  Pulmonary: Non labored breathing on room air, no wheezing or rales.   Abdominal: Soft. Normal bowel sounds. Non distended and non tender Musculoskeletal: Normal range of motion.    S/p R bka  Neurological: Alert and oriented to person, place, and time. Non focal  Skin: Skin is warm and dry.    Condition at discharge: good  The results of significant diagnostics from this hospitalization (including imaging, microbiology, ancillary and laboratory) are listed below for reference.   Imaging Studies: CT KNEE RIGHT W CONTRAST Result Date: 12/26/2023 CLINICAL DATA:  Wound along BKA incision site, query abscess EXAM: CT OF THE RIGHT KNEE WITH CONTRAST TECHNIQUE: Multidetector CT imaging was performed following the standard protocol during bolus administration of intravenous contrast. RADIATION DOSE REDUCTION: This exam was performed according to the departmental dose-optimization program which includes automated exposure control, adjustment of the mA and/or kV according to patient size and/or use of iterative reconstruction technique. CONTRAST:  75mL OMNIPAQUE  IOHEXOL  350 MG/ML SOLN COMPARISON:  11/10/2023 FINDINGS: Bones/Joint/Cartilage Below the knee amputation with sharp truncation of the tibia and fibula with mild marginal periostitis/osteoid, compatible with normal healing response. No evidence of osteomyelitis involving the tibia or fibula. Total knee prosthesis.  Small knee effusion. Ligaments Suboptimally assessed by CT. Muscles and Tendons Expected  appearance of regional musculature given the below the knee amputation performed on 11/14/2023. No intramuscular abscess observed. Soft tissues Infiltrative edema signal in the subcutaneous tissues along the distal stump without abscess or drainable process identified. At this time. Atheromatous vascular calcifications. IMPRESSION: 1. Infiltrative edema signal in the subcutaneous tissues along the distal stump without abscess or drainable process identified. 2. No evidence of osteomyelitis involving the tibia or fibula. 3. Total knee prosthesis. Small knee effusion. 4. Atheromatous vascular calcifications. Electronically Signed   By: Ryan Salvage M.D.   On: 12/26/2023 14:10   CT CHEST W CONTRAST Result Date: 12/26/2023  EXAM: CT CHEST WITH CONTRAST 12/26/2023 01:33:00 PM TECHNIQUE: CT of the chest was performed with the administration of intravenous contrast. Multiplanar reformatted images are provided for review. Automated exposure control, iterative reconstruction, and/or weight based adjustment of the mA/kV was utilized to reduce the radiation dose to as low as reasonably achievable. CONTRAST: 75mL (iohexol  (OMNIPAQUE ) 350 MG/ML injection 75 mL IOHEXOL  350 MG/ML SOLN) COMPARISON: None available. CLINICAL HISTORY: Trying to better assess whether or not true airspace opacity. Fever; Post-op Rt BKA; CT KNEE RIGHT W CONTRAST; This imaging needs to include all the way to the BKA incision site. Eval for post op fluid collection. Pt came in via POV d/t this morning having a fever of 101 \\T \ since he had a Rt sided BKA 5 weeks ago he came in since he was told to get checked out if anything like this happens. A/Ox4, rates post-op wound pain 6/10 during triage. Denies any drainage from the site \\T \ reports even when he had a wound vac in the hospital it did not drain much then either (per pt). FINDINGS: MEDIASTINUM: Scattered coronary and aortic calcifications. Heart and pericardium are unremarkable. The central  airways are clear. LYMPH NODES: No mediastinal, hilar or axillary lymphadenopathy. LUNGS AND PLEURA: Focal airspace consolidation in the posterior basal segment of the right lower lobe. Dependent atelectasis posteriorly at the left lung base. Trace right pleural effusion. No pneumothorax. SOFT TISSUES/BONES: Left shoulder arthroplasty. Anterior cervical fixation hardware. Spurring in the lower thoracic spine. Thoracolumbar fixation hardware, incompletely visualized distally. Right shoulder DJD. No acute abnormality of the soft tissues. UPPER ABDOMEN: Cholecystectomy clips. Left renal cyst, incompletely characterized. IMPRESSION: 1. Focal airspace consolidation in the posterior basal segment of the right lower lobe, possibly representing pneumonia. 2. Dependent atelectasis posteriorly at the left lung base. 3. Trace right pleural effusion. Electronically signed by: Katheleen Faes MD 12/26/2023 02:02 PM EDT RP Workstation: [id number]   CT FEMUR RIGHT W CONTRAST Result Date: 12/26/2023 CLINICAL DATA:  Erythema and questionable discharge along incisional site from right below the knee amputation formed on 11/14/2023 EXAM: CT OF THE LOWER RIGHT EXTREMITY WITH CONTRAST TECHNIQUE: Multidetector CT imaging of the lower right extremity was performed according to the standard protocol following intravenous contrast administration. RADIATION DOSE REDUCTION: This exam was performed according to the departmental dose-optimization program which includes automated exposure control, adjustment of the mA and/or kV according to patient size and/or use of iterative reconstruction technique. CONTRAST:  75mL OMNIPAQUE  IOHEXOL  350 MG/ML SOLN COMPARISON:  None Available. FINDINGS: Bones/Joint/Cartilage Right total hip prosthesis. Total knee prosthesis. Mild heterotopic ossifications scattered anterior to the right hip prosthesis mainly along the joint and iliopsoas tendon and to a lesser extent extending towards the tensor fascia lata.  Known old pubic ramus fractures. Femur otherwise unremarkable. Deformity anteriorly along the right iliac bone with some heterotopic calcification, possibly a site of prior bone graft harvesting. Small effusion in the suprapatellar bursa. Ligaments Suboptimally assessed by CT. Muscles and Tendons Minimal calcification in the mildly atrophic rectus femoris muscle laterally. Soft tissues Sciatic notch sciatic nerve unremarkable. Mild subcutaneous edema posteromedially along the distal thigh on image 289 series 4. Atheromatous vascular calcification of arterial structures noted. Please note that the knee joint was included but the distal tibia/fibula were is not included on today's CT of the femur. IMPRESSION: 1. Right total hip prosthesis and total knee prosthesis. 2. Mild heterotopic ossifications anterior to the right hip prosthesis mainly along the joint and iliopsoas tendon and to a lesser extent extending  towards the tensor fascia lata. 3. Small effusion in the suprapatellar bursa. 4. Mild subcutaneous edema posteromedially along the distal thigh. 5. Atheromatous vascular calcification of arterial structures. 6. Please note that the knee joint was included but the distal tibia/fibula were not included on today's CT of the femur. 7. Known old pubic ramus fractures. 8. Deformity anteriorly along the right iliac bone with some heterotopic calcification, possibly a site of prior bone graft harvesting. Electronically Signed   By: Ryan Salvage M.D.   On: 12/26/2023 12:24   CT ABDOMEN PELVIS W CONTRAST Result Date: 12/26/2023 CLINICAL DATA:  Right lower quadrant abdominal pain. EXAM: CT ABDOMEN AND PELVIS WITH CONTRAST TECHNIQUE: Multidetector CT imaging of the abdomen and pelvis was performed using the standard protocol following bolus administration of intravenous contrast. RADIATION DOSE REDUCTION: This exam was performed according to the departmental dose-optimization program which includes automated  exposure control, adjustment of the mA and/or kV according to patient size and/or use of iterative reconstruction technique. CONTRAST:  75mL OMNIPAQUE  IOHEXOL  350 MG/ML SOLN COMPARISON:  CT pelvis 03/19/2023 and CT abdomen 09/25/2021 FINDINGS: Lower chest: Trace right pleural effusion. Atelectasis/volume loss posteriorly in the left lower lobe along the diaphragm. Indistinct airspace opacity in the posterior basal segment right lower lobe concerning for potential pneumonia. Hepatobiliary: Cholecystectomy. Common bile duct 1.1 cm in diameter, roughly similar compared to 09/25/2021, and likely reflecting a physiologic response to cholecystectomy. Pancreas: Unremarkable Spleen: Unremarkable Adrenals/Urinary Tract: Simple left renal cysts warrant no further imaging workup. 3 mm right kidney upper pole nonobstructive renal calculus. Partial obscuration of the urinary bladder and distal ureters due to streak artifact from the metal hardware. Stomach/Bowel: Sigmoid colon diverticulosis. Scattered diverticula of the descending colon. No findings of active diverticulitis. There is evidence of prior fundoplication in the proximal stomach. Vascular/Lymphatic: Atherosclerosis is present, including aortoiliac atherosclerotic disease. Reproductive: Unremarkable Other: No supplemental non-categorized findings. Musculoskeletal: Bilateral lower rib deformities compatible with prior fractures, the left lower rib deformities appear chronic in the right lower rib deformities are potentially late subacute or chronic. Solid interbody fusion T12-L1 with lateral plate fixator interbody spacer solid bony union. Posterolateral rod and pedicle screw fixation L1-L2-L3-L4-S1 bilaterally with solid interbody bony bridging at all levels except L4-5 where there is an interbody spacer. The facet joints do appear fused at L4-5. Suspected prior graft harvest site from the right anterior iliac. Bridging spurring of the right SI joint. Bilateral hip  prostheses. Remote right pubic ramus fractures with associated callus, also shown on 03/19/2023. Chondrocalcinosis of the pubic symphysis. Heterotopic ossification anterior to the right hip prosthesis. There is also some heterotopic ossification in the left distal iliopsoas musculotendinous junction. IMPRESSION: 1. Indistinct airspace opacity in the posterior basal segment right lower lobe concerning for potential pneumonia. Consider follow up two view chest radiography to ensure complete clearance. 2. Trace right pleural effusion. 3. 3 mm right kidney upper pole nonobstructive renal calculus. 4. Sigmoid colon diverticulosis without findings of active diverticulitis. 5. Bilateral lower rib deformities compatible with prior fractures, the left lower rib deformities appear chronic in the right lower rib deformities are potentially late subacute or chronic. 6. Extensive postoperative findings in the lumbar spine without complicating feature. Bilateral hip prostheses. Old healed right pelvic fractures. 7.  Aortic Atherosclerosis (ICD10-I70.0). Electronically Signed   By: Ryan Salvage M.D.   On: 12/26/2023 12:19   DG Chest 2 View Result Date: 12/26/2023 CLINICAL DATA:  Fever. EXAM: CHEST - 2 VIEW COMPARISON:  11/08/2023. FINDINGS: Bilateral lung fields are  clear. No dense consolidation or lung collapse. Bilateral costophrenic angles are clear. Elevated left hemidiaphragm noted. Stable cardio-mediastinal silhouette. No acute osseous abnormalities. Left reverse shoulder arthroplasty noted. The soft tissues are within normal limits. IMPRESSION: No active cardiopulmonary disease. Electronically Signed   By: Ree Molt M.D.   On: 12/26/2023 10:27    Microbiology: Results for orders placed or performed during the hospital encounter of 12/26/23  Resp panel by RT-PCR (RSV, Flu A&B, Covid) Anterior Nasal Swab     Status: None   Collection Time: 12/26/23 10:39 AM   Specimen: Anterior Nasal Swab  Result Value  Ref Range Status   SARS Coronavirus 2 by RT PCR NEGATIVE NEGATIVE Final   Influenza A by PCR NEGATIVE NEGATIVE Final   Influenza B by PCR NEGATIVE NEGATIVE Final    Comment: (NOTE) The Xpert Xpress SARS-CoV-2/FLU/RSV plus assay is intended as an aid in the diagnosis of influenza from Nasopharyngeal swab specimens and should not be used as a sole basis for treatment. Nasal washings and aspirates are unacceptable for Xpert Xpress SARS-CoV-2/FLU/RSV testing.  Fact Sheet for Patients: BloggerCourse.com  Fact Sheet for Healthcare Providers: SeriousBroker.it  This test is not yet approved or cleared by the [place]  FDA and has been authorized for detection and/or diagnosis of SARS-CoV-2 by FDA under an Emergency Use Authorization (EUA). This EUA will remain in effect (meaning this test can be used) for the duration of the COVID-19 declaration under Section 564(b)(1) of the Act, 21 U.S.C. section 360bbb-3(b)(1), unless the authorization is terminated or revoked.     Resp Syncytial Virus by PCR NEGATIVE NEGATIVE Final    Comment: (NOTE) Fact Sheet for Patients: BloggerCourse.com  Fact Sheet for Healthcare Providers: SeriousBroker.it  This test is not yet approved or cleared by the [place]  FDA and has been authorized for detection and/or diagnosis of SARS-CoV-2 by FDA under an Emergency Use Authorization (EUA). This EUA will remain in effect (meaning this test can be used) for the duration of the COVID-19 declaration under Section 564(b)(1) of the Act, 21 U.S.C. section 360bbb-3(b)(1), unless the authorization is terminated or revoked.  Performed at [facility], [address], [place], [place] [address]   Blood culture (routine x 2)     Status: None (Preliminary result)   Collection Time: 12/26/23 11:03 AM   Specimen: BLOOD  Result Value Ref Range Status    Specimen Description BLOOD RIGHT ANTECUBITAL  Final   Special Requests   Final    BOTTLES DRAWN AEROBIC AND ANAEROBIC Blood Culture adequate volume   Culture   Final    NO GROWTH 3 DAYS Performed at [facility], [address], [place], [place] [address]    Report Status PENDING  Incomplete  Blood culture (routine x 2)     Status: None (Preliminary result)   Collection Time: 12/26/23 11:05 AM   Specimen: BLOOD RIGHT HAND  Result Value Ref Range Status   Specimen Description BLOOD RIGHT HAND  Final   Special Requests   Final    BOTTLES DRAWN AEROBIC AND ANAEROBIC Blood Culture adequate volume   Culture   Final    NO GROWTH 3 DAYS Performed at [facility], [address] [place], [place] [address]    Report Status PENDING  Incomplete   *Note: Due to a large number of results and/or encounters for the requested time period, some results have not been displayed. A complete set of results can be found in Results Review.  Labs: CBC: Recent Labs  Lab 12/26/23 0951 12/27/23 0800 12/28/23 0606 12/29/23 0707  WBC 17.4* 10.9* 8.7 8.3  NEUTROABS 13.8*  --   --   --   HGB 12.6* 10.9* 10.8* 11.5*  HCT 40.3 34.0* 34.6* 36.3*  MCV 92.6 92.6 92.5 92.4  PLT 151 121* 119* 141*   Basic Metabolic Panel: Recent Labs  Lab 12/26/23 0951 12/27/23 0800 12/28/23 0606 12/29/23 0707  NA 138 137 140 140  K 3.7 3.4* 3.5 3.2*  CL 103 106 110 109  CO2 25 21* 21* 23  GLUCOSE 102* 83 112* 81  BUN 14 14 12 11   CREATININE 0.87 0.64 0.60* 0.63  CALCIUM  8.9 8.6* 8.6* 8.5*  MG  --   --  1.8  --   PHOS  --   --  3.4  --    Liver Function Tests: Recent Labs  Lab 12/26/23 0951 12/27/23 0800  AST 15 10*  ALT 16 13  ALKPHOS 77 61  BILITOT 0.9 0.9  PROT 5.9* 4.9*  ALBUMIN 3.2* 2.4*   CBG: No results for input(s): GLUCAP in the last 168 hours.  Discharge time spent: greater than 30 minutes.  Signed: Alban Pepper, MD [facility] 12/29/2023

## 2023-12-29 NOTE — Plan of Care (Signed)

## 2023-12-29 NOTE — Plan of Care (Signed)

## 2023-12-30 ENCOUNTER — Telehealth: Payer: Self-pay

## 2023-12-30 ENCOUNTER — Encounter: Payer: Self-pay | Admitting: Family

## 2023-12-30 NOTE — Transitions of Care (Post Inpatient/ED Visit) (Signed)
 12/30/2023  Name: Levi [name] MRN: [id number] DOB: [birth year]  Today's TOC FU Call Status: Today's TOC FU Call Status:: Successful TOC FU Call Completed TOC FU Call Complete Date: 12/30/23 Patient's Name and Date of Birth confirmed.  Transition Care Management Follow-up Telephone Call Date of Discharge: 12/29/23 Discharge Facility: [name]) Type of Discharge: Inpatient Admission Primary Inpatient Discharge Diagnosis:: Mild Cellulitis of residual R limb at BKA site How have you been since you were released from the hospital?: Better Any questions or concerns?: No  Items Reviewed: Did you receive and understand the discharge instructions provided?: Yes Medications obtained,verified, and reconciled?: Yes (Medications Reviewed) Any new allergies since your discharge?: No Dietary orders reviewed?: Yes Type of Diet Ordered:: low sodium heart healthy Do you have support at home?: Yes People in Home [RPT]: spouse Name of Support/Comfort Primary Source: Wife at home can help as needed  Medications Reviewed Today: Medications Reviewed Today     Reviewed by Lauro Shona DELENA, RN (Registered Nurse) on 12/30/23 at [phone]  Med List Status: <None>   Medication Order Taking? Sig Documenting Provider Last Dose Status Informant  acetaminophen  (TYLENOL ) 325 MG tablet [id number] Yes Take 1-2 tablets (325-650 mg total) by mouth every 6 (six) hours as needed for mild pain (pain score 1-3) (or temp > 100.5). Maurice Sharlet RAMAN, PA-C  Active Self, Spouse/Significant Other  albuterol  (VENTOLIN  HFA) 108 (90 Base) MCG/ACT inhaler [id number] Yes INHALE 2 PUFFS BY MOUTH EVERY 4 HOURS AS NEEDED FOR WHEEZE OR FOR SHORTNESS OF BREATH Young, Clinton D, MD  Active Spouse/Significant Other, Self  apixaban  (ELIQUIS ) 5 MG TABS tablet [id number] Yes Take 1 tablet (5 mg total) by mouth 2 (two) times daily. Maurice Sharlet RAMAN, PA-C  Active Self, Spouse/Significant Other  atorvastatin  (LIPITOR) 20 MG tablet [id number] Yes  Take 1 tablet (20 mg total) by mouth daily. Jeffrie Oneil BROCKS, MD  Active Self, Spouse/Significant Other  cephALEXin  (KEFLEX ) 500 MG capsule [id number] Yes Take 1 capsule (500 mg total) by mouth every 12 (twelve) hours for 7 days. Franchot Novel, MD  Active   cyclobenzaprine  (FLEXERIL ) 10 MG tablet [id number] Yes Take 1 tablet (10 mg total) by mouth 3 (three) times daily as needed for muscle spasms. Curtis Debby PARAS, MD  Active Spouse/Significant Other, Self  diltiazem  (CARDIZEM  CD) 180 MG 24 hr capsule [id number] Yes Take 1 capsule (180 mg total) by mouth daily. Jeffrie Oneil BROCKS, MD  Active Spouse/Significant Other, Self  DULoxetine  (CYMBALTA ) 30 MG capsule [id number] Yes Take 1 capsule (30 mg total) by mouth daily. Curtis Debby PARAS, MD  Active Self, Spouse/Significant Other  feeding supplement (ENSURE PLUS HIGH PROTEIN) LIQD [id number]  Take 237 mLs by mouth 2 (two) times daily between meals.  Patient not taking: Reported on 12/30/2023   Raenelle Donalda HERO, MD  Active Self, Spouse/Significant Other  ferrous gluconate  [facility]) 324 MG tablet [id number] Yes Take 1 tablet (324 mg total) by mouth daily. Maurice Sharlet RAMAN, PA-C  Active Self, Spouse/Significant Other  furosemide  (LASIX ) 40 MG tablet [id number] Yes Take 0.5 tablets (20 mg total) by mouth daily as needed for edema. Franchot Novel, MD  Active   hydrocerin [facility]) CREA [id number] Yes Apply 1 Application topically daily. To left foot Love, Sharlet RAMAN, PA-C  Active Self, Spouse/Significant Other  ipratropium-albuterol  (DUONEB) 0.5-2.5 (3) MG/3ML SOLN [id number] Yes 1 NEBULE EVERY 6 HOURS AS NEEDED. **J45.3THORA Neysa Rama D, MD  Active Spouse/Significant Other, Self  isosorbide  mononitrate (IMDUR ) 30 MG 24 hr tablet [id number] Yes  Take 1 tablet (30 mg total) by mouth daily. Jeffrie Oneil BROCKS, MD  Active Self, Spouse/Significant Other  melatonin 5 MG TABS [id number] Yes Take 1 tablet (5 mg total) by mouth at bedtime as needed. Maurice Sharlet RAMAN, PA-C   Active Self, Spouse/Significant Other  Multiple Vitamin (MULTIVITAMIN WITH MINERALS) TABS tablet [id number] Yes Take 1 tablet by mouth daily. Maurice Sharlet RAMAN, PA-C  Active Self, Spouse/Significant Other  Olodaterol HCl (STRIVERDI RESPIMAT) 2.5 MCG/ACT AERS [id number] Yes INHALE 2 PUFFS BY MOUTH INTO THE LUNGS DAILY Neysa Reggy BIRCH, MD  Active Spouse/Significant Other, Self  ondansetron  (ZOFRAN -ODT) 8 MG disintegrating tablet [id number]  Take 1 tablet (8 mg total) by mouth every 8 (eight) hours as needed for nausea. Curtis Debby PARAS, MD  Consider Medication Status and Discontinue (No longer needed (for PRN medications)) Spouse/Significant Other, Self  oxyCODONE  (OXY IR/ROXICODONE ) 5 MG immediate release tablet [id number] Yes Take 1-2 tablets (5-10 mg total) by mouth every 6 (six) hours as needed for severe pain (pain score 7-10) (5 mg for pain level 5-7 and 10 mg for pain > level 7). Maurice Sharlet RAMAN, PA-C  Active Self, Spouse/Significant Other  pantoprazole  (PROTONIX ) 40 MG tablet [id number] Yes Take 1 tablet (40 mg total) by mouth 2 (two) times daily before a meal. Love, Pamela S, PA-C  Active Self, Spouse/Significant Other  polyethylene glycol (MIRALAX  / GLYCOLAX ) 17 g packet [id number] Yes Take 17 g by mouth daily. Raenelle Donalda HERO, MD  Consider Medication Status and Discontinue (No longer needed (for PRN medications)) Self, Spouse/Significant Other  potassium chloride  SA (KLOR-CON  M) 20 MEQ tablet [id number] Yes Take 2 tablets (40 mEq total) by mouth daily. Maurice Sharlet RAMAN, PA-C  Active Self, Spouse/Significant Other  predniSONE  (DELTASONE ) 5 MG tablet [id number] Yes 3 tabs daily Neysa Reggy D, MD  Active Self, Spouse/Significant Other  pregabalin  (LYRICA ) 100 MG capsule [id number] Yes Take 1 capsule (100 mg total) by mouth 2 (two) times daily. Maurice Sharlet RAMAN, PA-C  Active Self, Spouse/Significant Other  tamsulosin  (FLOMAX ) 0.4 MG CAPS capsule [id number] Yes Take 1 capsule (0.4 mg total) by mouth at  bedtime. Maurice Sharlet RAMAN, PA-C  Active Self, Spouse/Significant Other            Home Care and Equipment/Supplies: Were Home Health Services Ordered?: Yes Name of Home Health Agency:: [name] Has Agency set up a time to come to your home?: Yes First Home Health Visit Date: 12/31/23 Any new equipment or medical supplies ordered?: No  Functional Questionnaire: Do you need assistance with bathing/showering or dressing?: No Do you need assistance with meal preparation?: Yes (wife prepares meals and daugther lives close) Do you need assistance with eating?: No Do you have difficulty maintaining continence: No Do you need assistance with getting out of bed/getting out of a chair/moving?: Yes (patient has wheelchair and is awaiting prosthetic) Do you have difficulty managing or taking your medications?: No  Follow up appointments reviewed: PCP Follow-up appointment confirmed?: Yes Date of PCP follow-up appointment?: 01/01/24 Follow-up Provider: [facility] Follow-up appointment confirmed?: Yes Date of Specialist follow-up appointment?: 01/15/24 Follow-Up Specialty Provider:: Urbano Albright Do you need transportation to your follow-up appointment?: No (Wife drives him) Do you understand care options if your condition(s) worsen?: Yes-patient verbalized understanding  SDOH Interventions Today    Flowsheet Row Most Recent Value  SDOH Interventions   Food Insecurity Interventions Intervention Not Indicated  Housing Interventions Intervention Not Indicated  Transportation Interventions Intervention Not Indicated  Utilities Interventions Intervention Not Indicated  Goals Addressed             This Visit's Progress    VBCI Transitions of Care (TOC) Care Plan       Problems:  Recent Hospitalization for treatment of Mild Cellulitis of residual R limb at BKA  Functional/Safety concern: High Risk of falls  Goal:  Over the next 30 days, the patient  will not experience hospital readmission  Interventions:  Transitions of Care: Doctor Visits  - discussed the importance of doctor visits Arranged PCP follow-up within 7 days (Care Guide Scheduled) Reviewed Signs and symptoms of infection  Falls Interventions: Reviewed medications and discussed potential side effects of medications such as dizziness and frequent urination Advised patient of importance of notifying provider of falls Assessed for falls since last encounter Assessed patients knowledge of fall risk prevention secondary to previously provided education Assessed social determinant of health barriers  Patient Self Care Activities:  Attend all scheduled provider appointments Call pharmacy for medication refills 3-7 days in advance of running out of medications Call provider office for new concerns or questions  Notify RN Care Manager of TOC call rescheduling needs Participate in Transition of Care Program/Attend TOC scheduled calls Take medications as prescribed    Plan:  Next PCP appointment scheduled for: 01/01/24 scheduled by care guide Telephone follow up appointment with care management team member scheduled for:  8/13 11am The patient has been provided with contact information for the care management team and has been advised to call with any health related questions or concerns.          Shona Prow RN, CCM [place]  VBCI-Population Health RN Care Manager [phone]

## 2023-12-30 NOTE — Telephone Encounter (Signed)
 Verbal orders given to resume home health OT PT

## 2023-12-30 NOTE — Telephone Encounter (Signed)
 Levi [name] with [name] home [facility]  States patient was discharged from hospital yesterday but the hospital failed to send over orders to resume home therapy services. Requesting verbal orders to  resume PT and OT.  O.k. to leave a detailed VM on confidential voicemail # [phone]

## 2023-12-30 NOTE — Patient Instructions (Signed)
 Visit Information  Thank you for taking time to visit with me today. Please don't hesitate to contact me if I can be of assistance to you before our next scheduled telephone appointment.  Our next appointment is by telephone on 01/07/24 at 11am  Following is a copy of your care plan:   Goals Addressed             This Visit's Progress    VBCI Transitions of Care (TOC) Care Plan       Problems:  Recent Hospitalization for treatment of Mild Cellulitis of residual R limb at BKA  Functional/Safety concern: High Risk of falls  Goal:  Over the next 30 days, the patient will not experience hospital readmission  Interventions:  Transitions of Care: Doctor Visits  - discussed the importance of doctor visits Arranged PCP follow-up within 7 days (Care Guide Scheduled) Reviewed Signs and symptoms of infection  Falls Interventions: Reviewed medications and discussed potential side effects of medications such as dizziness and frequent urination Advised patient of importance of notifying provider of falls Assessed for falls since last encounter Assessed patients knowledge of fall risk prevention secondary to previously provided education Assessed social determinant of health barriers  Patient Self Care Activities:  Attend all scheduled provider appointments Call pharmacy for medication refills 3-7 days in advance of running out of medications Call provider office for new concerns or questions  Notify RN Care Manager of [facility] call rescheduling needs Participate in Transition of Care Program/Attend TOC scheduled calls Take medications as prescribed    Plan:  Next PCP appointment scheduled for: 01/01/24 scheduled by care guide Telephone follow up appointment with care management team member scheduled for:  8/13 11am The patient has been provided with contact information for the care management team and has been advised to call with any health related questions or concerns.          Patient verbalizes understanding of instructions and care plan provided today and agrees to view in MyChart. Active MyChart status and patient understanding of how to access instructions and care plan via MyChart confirmed with patient.     Telephone follow up appointment with care management team member scheduled for: 01/07/24 11am The patient has been provided with contact information for the care management team and has been advised to call with any health related questions or concerns.   Please call the care guide team at [phone] if you need to cancel or reschedule your appointment.   Please call the Suicide and Crisis Lifeline: 988 call 1-800-273-TALK (toll free, 24 hour hotline) call 911 if you are experiencing a Mental Health or Behavioral Health Crisis or need someone to talk to.  Shona Prow RN, CCM [employer]  VBCI-Population Health RN Care Manager [phone]

## 2023-12-30 NOTE — Telephone Encounter (Signed)
 Cara with [facility] verbal order approval given.

## 2023-12-31 ENCOUNTER — Other Ambulatory Visit: Payer: Self-pay | Admitting: Sports Medicine

## 2023-12-31 LAB — CULTURE, BLOOD (ROUTINE X 2)
Culture: NO GROWTH
Culture: NO GROWTH
Special Requests: ADEQUATE
Special Requests: ADEQUATE

## 2024-01-01 ENCOUNTER — Encounter: Payer: Self-pay | Admitting: Sports Medicine

## 2024-01-01 ENCOUNTER — Ambulatory Visit (INDEPENDENT_AMBULATORY_CARE_PROVIDER_SITE_OTHER): Admitting: Sports Medicine

## 2024-01-01 VITALS — BP 128/73 | HR 41 | Temp 97.9°F | Resp 20 | Ht 72.0 in

## 2024-01-01 DIAGNOSIS — E875 Hyperkalemia: Secondary | ICD-10-CM

## 2024-01-01 DIAGNOSIS — E538 Deficiency of other specified B group vitamins: Secondary | ICD-10-CM

## 2024-01-01 DIAGNOSIS — E611 Iron deficiency: Secondary | ICD-10-CM | POA: Diagnosis not present

## 2024-01-01 DIAGNOSIS — Z89511 Acquired absence of right leg below knee: Secondary | ICD-10-CM | POA: Diagnosis not present

## 2024-01-01 DIAGNOSIS — F0631 Mood disorder due to known physiological condition with depressive features: Secondary | ICD-10-CM

## 2024-01-01 DIAGNOSIS — J189 Pneumonia, unspecified organism: Secondary | ICD-10-CM | POA: Diagnosis not present

## 2024-01-01 NOTE — Assessment & Plan Note (Signed)
 The stump looks really good, no erythema, induration, warmth. There was a small area of skin breakdown, this was cleaned and then covered with a Mepilex border dressing. He will put his shrinker back on. He is getting set up for a prosthesis.

## 2024-01-01 NOTE — Assessment & Plan Note (Signed)
 Levi [name] was a bit depressed status post his amputation, we restarted Cymbalta  at 30 mg. He has been on this now for approximately 2 weeks. He is having some difficulty with his memory, this may be related to pseudodementia of depression versus a progression of cognitive impairment with aging and chronic disease. I think his memory will improve as the Cymbalta  starts working but if not we can certainly add Aricept.

## 2024-01-01 NOTE — Assessment & Plan Note (Signed)
 Levi [name] is here for hospital follow-up, he was admitted with a fever, there was some drainage noted from his right BKA stump, he also had what appeared to be consolidation in the lung consistent with pneumonia. He was treated with antibiotics, improved, and was discharged. He has been doing pretty well all things considered, he tells me he did have a bit of a cough when he was admitted which leads me to believe that the pneumonia was likely the etiology. He will finish out his Keflex .

## 2024-01-01 NOTE — Progress Notes (Signed)
    Procedures performed today:    None.  Independent interpretation of notes and tests performed by another provider:   None.  Brief History, Exam, Impression, and Recommendations:    CAP (community acquired pneumonia) Marinell is here for hospital follow-up, he was admitted with a fever, there was some drainage noted from his right BKA stump, he also had what appeared to be consolidation in the lung consistent with pneumonia. He was treated with antibiotics, improved, and was discharged. He has been doing pretty well all things considered, he tells me he did have a bit of a cough when he was admitted which leads me to believe that the pneumonia was likely the etiology. He will finish out his Keflex .  History of right below knee amputation (HCC) The stump looks really good, no erythema, induration, warmth. There was a small area of skin breakdown, this was cleaned and then covered with a Mepilex border dressing. He will put his shrinker back on. He is getting set up for a prosthesis.  Depression due to physical illness Marinell was a bit depressed status post his amputation, we restarted Cymbalta  at 30 mg. He has been on this now for approximately 2 weeks. He is having some difficulty with his memory, this may be related to pseudodementia of depression versus a progression of cognitive impairment with aging and chronic disease. I think his memory will improve as the Cymbalta  starts working but if not we can certainly add Aricept.  Hyperkalemia Potassium levels are elevated, discontinue potassium supplementation and recheck BMP in 1 to 2 weeks.    ____________________________________________ Debby PARAS. Curtis, M.D., ABFM., CAQSM., AME. Primary Care and Sports Medicine [facility]  Adjunct Professor of [facility] Medicine  [employer]  Restaurant manager, fast food

## 2024-01-02 ENCOUNTER — Ambulatory Visit: Payer: Self-pay | Admitting: Sports Medicine

## 2024-01-02 DIAGNOSIS — E875 Hyperkalemia: Secondary | ICD-10-CM | POA: Insufficient documentation

## 2024-01-02 DIAGNOSIS — I480 Paroxysmal atrial fibrillation: Secondary | ICD-10-CM | POA: Diagnosis not present

## 2024-01-02 DIAGNOSIS — M869 Osteomyelitis, unspecified: Secondary | ICD-10-CM | POA: Diagnosis not present

## 2024-01-02 DIAGNOSIS — T8144XD Sepsis following a procedure, subsequent encounter: Secondary | ICD-10-CM | POA: Diagnosis not present

## 2024-01-02 DIAGNOSIS — L03115 Cellulitis of right lower limb: Secondary | ICD-10-CM | POA: Diagnosis not present

## 2024-01-02 DIAGNOSIS — A419 Sepsis, unspecified organism: Secondary | ICD-10-CM | POA: Diagnosis not present

## 2024-01-02 DIAGNOSIS — T8143XD Infection following a procedure, organ and space surgical site, subsequent encounter: Secondary | ICD-10-CM | POA: Diagnosis not present

## 2024-01-02 LAB — CBC WITH DIFFERENTIAL/PLATELET
Basophils Absolute: 0 x10E3/uL (ref 0.0–0.2)
Basos: 0 %
EOS (ABSOLUTE): 0 x10E3/uL (ref 0.0–0.4)
Eos: 0 %
Hematocrit: 39.5 % (ref 37.5–51.0)
Hemoglobin: 12.2 g/dL — ABNORMAL LOW (ref 13.0–17.7)
Lymphocytes Absolute: 0.7 x10E3/uL (ref 0.7–3.1)
Lymphs: 6 %
MCH: 28.6 pg (ref 26.6–33.0)
MCHC: 30.9 g/dL — ABNORMAL LOW (ref 31.5–35.7)
MCV: 93 fL (ref 79–97)
Monocytes Absolute: 0.2 x10E3/uL (ref 0.1–0.9)
Monocytes: 2 %
Neutrophils Absolute: 9.4 x10E3/uL — ABNORMAL HIGH (ref 1.4–7.0)
Neutrophils: 86 %
Platelets: 192 x10E3/uL (ref 150–450)
RBC: 4.27 x10E6/uL (ref 4.14–5.80)
RDW: 15.5 % — ABNORMAL HIGH (ref 11.6–15.4)
WBC: 10.9 x10E3/uL — ABNORMAL HIGH (ref 3.4–10.8)

## 2024-01-02 LAB — COMPREHENSIVE METABOLIC PANEL WITH GFR
ALT: 17 IU/L (ref 0–44)
AST: 16 IU/L (ref 0–40)
Albumin: 3.8 g/dL (ref 3.8–4.8)
Alkaline Phosphatase: 89 IU/L (ref 44–121)
BUN/Creatinine Ratio: 32 — ABNORMAL HIGH (ref 10–24)
BUN: 21 mg/dL (ref 8–27)
Bilirubin Total: 0.5 mg/dL (ref 0.0–1.2)
CO2: 23 mmol/L (ref 20–29)
Calcium: 9.3 mg/dL (ref 8.6–10.2)
Chloride: 104 mmol/L (ref 96–106)
Creatinine, Ser: 0.65 mg/dL — ABNORMAL LOW (ref 0.76–1.27)
Globulin, Total: 1.9 g/dL (ref 1.5–4.5)
Glucose: 99 mg/dL (ref 70–99)
Potassium: 5.7 mmol/L — ABNORMAL HIGH (ref 3.5–5.2)
Sodium: 139 mmol/L (ref 134–144)
Total Protein: 5.7 g/dL — ABNORMAL LOW (ref 6.0–8.5)
eGFR: 98 mL/min/1.73 (ref >59)

## 2024-01-02 LAB — IMMATURE CELLS
MYELOCYTES: 2 % — ABNORMAL HIGH (ref 0–0)
Metamyelocytes: 4 % — ABNORMAL HIGH (ref 0–0)

## 2024-01-02 LAB — B12 AND FOLATE PANEL
Folate: 12.2 ng/mL (ref >3.0)
Vitamin B-12: 560 pg/mL (ref 232–1245)

## 2024-01-02 LAB — IRON,TIBC AND FERRITIN PANEL
Ferritin: 158 ng/mL (ref 30–400)
Iron Saturation: 16 % (ref 15–55)
Iron: 48 ug/dL (ref 38–169)
Total Iron Binding Capacity: 297 ug/dL (ref 250–450)
UIBC: 249 ug/dL (ref 111–343)

## 2024-01-02 NOTE — Addendum Note (Signed)
 Addended by: CURTIS DEBBY PARAS on: 01/02/2024 11:09 AM   Modules accepted: Orders

## 2024-01-02 NOTE — Assessment & Plan Note (Signed)
 Potassium levels are elevated, discontinue potassium supplementation and recheck BMP in 1 to 2 weeks.

## 2024-01-05 ENCOUNTER — Telehealth: Payer: Self-pay

## 2024-01-05 DIAGNOSIS — L03115 Cellulitis of right lower limb: Secondary | ICD-10-CM | POA: Diagnosis not present

## 2024-01-05 DIAGNOSIS — T8144XD Sepsis following a procedure, subsequent encounter: Secondary | ICD-10-CM | POA: Diagnosis not present

## 2024-01-05 DIAGNOSIS — A419 Sepsis, unspecified organism: Secondary | ICD-10-CM | POA: Diagnosis not present

## 2024-01-05 DIAGNOSIS — I480 Paroxysmal atrial fibrillation: Secondary | ICD-10-CM | POA: Diagnosis not present

## 2024-01-05 DIAGNOSIS — M869 Osteomyelitis, unspecified: Secondary | ICD-10-CM | POA: Diagnosis not present

## 2024-01-05 DIAGNOSIS — T8143XD Infection following a procedure, organ and space surgical site, subsequent encounter: Secondary | ICD-10-CM | POA: Diagnosis not present

## 2024-01-05 NOTE — Telephone Encounter (Signed)
 Copied from CRM #[id number]. Topic: Clinical - Home Health Verbal Orders >> Jan 05, 2024 11:18 AM Mercer PEDLAR wrote: Caller/Agency: Signe from [place]. Callback Number: [phone] Service Requested: Occupational Therapy Frequency: 2 week 1, 1 week 4.  Any new concerns about the patient? No

## 2024-01-05 NOTE — Telephone Encounter (Signed)
 Verbal orders given

## 2024-01-05 NOTE — Telephone Encounter (Signed)
 Copied from CRM #[id number]. Topic: Clinical - Home Health Verbal Orders >> Jan 05, 2024  2:27 PM Rosaria LABOR wrote: Caller/Agency: Lorie with [facility] Callback Number: [phone] Service Requested: Physical Therapy Frequency: 1x1 2x3 1x2 Any new concerns about the patient? No  Per Lorie her phone number is a secure number, can leave a voice mail.

## 2024-01-05 NOTE — Telephone Encounter (Signed)
 VO's given. Documentation being faxed.

## 2024-01-06 ENCOUNTER — Other Ambulatory Visit: Payer: Self-pay | Admitting: Sports Medicine

## 2024-01-06 DIAGNOSIS — E039 Hypothyroidism, unspecified: Secondary | ICD-10-CM | POA: Diagnosis not present

## 2024-01-06 DIAGNOSIS — J439 Emphysema, unspecified: Secondary | ICD-10-CM | POA: Diagnosis not present

## 2024-01-06 DIAGNOSIS — M48061 Spinal stenosis, lumbar region without neurogenic claudication: Secondary | ICD-10-CM | POA: Diagnosis not present

## 2024-01-06 DIAGNOSIS — F54 Psychological and behavioral factors associated with disorders or diseases classified elsewhere: Secondary | ICD-10-CM | POA: Diagnosis not present

## 2024-01-06 DIAGNOSIS — I110 Hypertensive heart disease with heart failure: Secondary | ICD-10-CM | POA: Diagnosis not present

## 2024-01-06 DIAGNOSIS — J4540 Moderate persistent asthma, uncomplicated: Secondary | ICD-10-CM | POA: Diagnosis not present

## 2024-01-06 DIAGNOSIS — M109 Gout, unspecified: Secondary | ICD-10-CM | POA: Diagnosis not present

## 2024-01-06 DIAGNOSIS — E785 Hyperlipidemia, unspecified: Secondary | ICD-10-CM | POA: Diagnosis not present

## 2024-01-06 DIAGNOSIS — I5042 Chronic combined systolic (congestive) and diastolic (congestive) heart failure: Secondary | ICD-10-CM | POA: Diagnosis not present

## 2024-01-06 DIAGNOSIS — D509 Iron deficiency anemia, unspecified: Secondary | ICD-10-CM | POA: Diagnosis not present

## 2024-01-06 DIAGNOSIS — J189 Pneumonia, unspecified organism: Secondary | ICD-10-CM | POA: Diagnosis not present

## 2024-01-06 DIAGNOSIS — I480 Paroxysmal atrial fibrillation: Secondary | ICD-10-CM | POA: Diagnosis not present

## 2024-01-06 DIAGNOSIS — F0631 Mood disorder due to known physiological condition with depressive features: Secondary | ICD-10-CM

## 2024-01-06 DIAGNOSIS — N401 Enlarged prostate with lower urinary tract symptoms: Secondary | ICD-10-CM | POA: Diagnosis not present

## 2024-01-06 DIAGNOSIS — K5900 Constipation, unspecified: Secondary | ICD-10-CM | POA: Diagnosis not present

## 2024-01-06 DIAGNOSIS — Z89422 Acquired absence of other left toe(s): Secondary | ICD-10-CM | POA: Diagnosis not present

## 2024-01-06 DIAGNOSIS — I872 Venous insufficiency (chronic) (peripheral): Secondary | ICD-10-CM | POA: Diagnosis not present

## 2024-01-06 DIAGNOSIS — Z89511 Acquired absence of right leg below knee: Secondary | ICD-10-CM | POA: Diagnosis not present

## 2024-01-06 DIAGNOSIS — G629 Polyneuropathy, unspecified: Secondary | ICD-10-CM | POA: Diagnosis not present

## 2024-01-06 DIAGNOSIS — N39498 Other specified urinary incontinence: Secondary | ICD-10-CM | POA: Diagnosis not present

## 2024-01-06 DIAGNOSIS — I82813 Embolism and thrombosis of superficial veins of lower extremities, bilateral: Secondary | ICD-10-CM | POA: Diagnosis not present

## 2024-01-06 DIAGNOSIS — K219 Gastro-esophageal reflux disease without esophagitis: Secondary | ICD-10-CM | POA: Diagnosis not present

## 2024-01-06 DIAGNOSIS — M810 Age-related osteoporosis without current pathological fracture: Secondary | ICD-10-CM | POA: Diagnosis not present

## 2024-01-06 DIAGNOSIS — M519 Unspecified thoracic, thoracolumbar and lumbosacral intervertebral disc disorder: Secondary | ICD-10-CM | POA: Diagnosis not present

## 2024-01-06 DIAGNOSIS — J440 Chronic obstructive pulmonary disease with acute lower respiratory infection: Secondary | ICD-10-CM | POA: Diagnosis not present

## 2024-01-06 DIAGNOSIS — E538 Deficiency of other specified B group vitamins: Secondary | ICD-10-CM | POA: Diagnosis not present

## 2024-01-06 NOTE — Telephone Encounter (Signed)
 Levi [name] with [facility] given verbal order approval information.

## 2024-01-06 NOTE — Telephone Encounter (Signed)
 Left detailed verbal order approval information  on Jim with [name] listed confidential voice mail number.

## 2024-01-07 ENCOUNTER — Other Ambulatory Visit: Payer: Self-pay

## 2024-01-07 DIAGNOSIS — I5042 Chronic combined systolic (congestive) and diastolic (congestive) heart failure: Secondary | ICD-10-CM | POA: Diagnosis not present

## 2024-01-07 DIAGNOSIS — J440 Chronic obstructive pulmonary disease with acute lower respiratory infection: Secondary | ICD-10-CM | POA: Diagnosis not present

## 2024-01-07 DIAGNOSIS — I110 Hypertensive heart disease with heart failure: Secondary | ICD-10-CM | POA: Diagnosis not present

## 2024-01-07 DIAGNOSIS — J439 Emphysema, unspecified: Secondary | ICD-10-CM | POA: Diagnosis not present

## 2024-01-07 DIAGNOSIS — J189 Pneumonia, unspecified organism: Secondary | ICD-10-CM | POA: Diagnosis not present

## 2024-01-07 DIAGNOSIS — I480 Paroxysmal atrial fibrillation: Secondary | ICD-10-CM | POA: Diagnosis not present

## 2024-01-07 NOTE — Patient Instructions (Signed)
 Visit Information  Thank you for taking time to visit with me today. Please don't hesitate to contact me if I can be of assistance to you before our next scheduled telephone appointment.  Our next appointment is by telephone on 01/16/24 at 11am  Following is a copy of your care plan:   Goals Addressed             This Visit's Progress    VBCI Transitions of Care (TOC) Care Plan       Problems:  Recent Hospitalization for treatment of Mild Cellulitis of residual R limb at BKA  Functional/Safety concern: High Risk of falls  Goal:  Over the next 30 days, the patient will not experience hospital readmission  Interventions:  Transitions of Care: Doctor Visits  - discussed the importance of doctor visits Arranged PCP follow-up within 7 days (Care Guide Scheduled) Reviewed Signs and symptoms of infection 01/07/24 Update: Patient not available at time of call/was working with home health PT at time of call and SN is coming later today - OT is coming another day - Wife completed call - wife states patient is adjusting to his life changes after June 20,2025 BKA and states he is doing well with transfers Reviewed need for blood work for K+ level repeat after K+ was stopped 01/01/24 for high K+ of 5.7  Falls Interventions: Reviewed medications and discussed potential side effects of medications such as dizziness and frequent urination Advised patient of importance of notifying provider of falls Assessed for falls since last encounter Assessed patients knowledge of fall risk prevention secondary to previously provided education Assessed social determinant of health barriers  Patient Self Care Activities:  Attend all scheduled provider appointments Call pharmacy for medication refills 3-7 days in advance of running out of medications Call provider office for new concerns or questions  Notify RN Care Manager of [facility] call rescheduling needs Participate in Transition of Care Program/Attend TOC  scheduled calls Take medications as prescribed    Plan:  Patient has appointments scheduled 01/15/24 with Urbano Albright  & Dr Harden  Telephone follow up appointment with care management team member scheduled for:  01/16/24 11am Post MD appointments  The patient has been provided with contact information for the care management team and has been advised to call with any health related questions or concerns.         Patient verbalizes understanding of instructions and care plan provided today and agrees to view in MyChart. Active MyChart status and patient understanding of how to access instructions and care plan via MyChart confirmed with patient.     Telephone follow up appointment with care management team member scheduled for: 01/16/24 11am  The patient has been provided with contact information for the care management team and has been advised to call with any health related questions or concerns.   Please call the care guide team at [phone] if you need to cancel or reschedule your appointment.   Please call the Suicide and Crisis Lifeline: 988 call 1-800-273-TALK (toll free, 24 hour hotline) call 911 if you are experiencing a Mental Health or Behavioral Health Crisis or need someone to talk to.  Shona Prow RN, CCM [place]  VBCI-Population Health RN Care Manager [phone]

## 2024-01-07 NOTE — Transitions of Care (Post Inpatient/ED Visit) (Signed)
 Transition of Care week 2  Visit Note  01/07/2024  Name: Levi [name] MRN: [id number]          DOB: 1945/12/19  Situation: Patient enrolled in [facility] 30-day program. Visit completed with wife by telephone.   Background: Admit/Discharge Date 8/1 - 8/4  Levi [name]   Primary Diagnosis: Mild Cellulitis of residual R limb at BKA site - amputation in November 14, 2023  Initial Transition Care Management Follow-up Telephone Call    Past Medical History:  Diagnosis Date   Agent orange exposure 1970's   takes Imdur ,Diltiazem , and Atacand  daily   Agent orange exposure    ALLERGIC RHINITIS    Allergy     seasonal allergies   Anemia    iron  deficiency   Arthritis    Asthma    uses inhaler and nebulizers   Atrial fibrillation (HCC)    Bruises easily    d/t prednisone  daily   Cataract    Cellulitis 03/22/2020   Chest wall pain 09/21/2019   L scapula, reported 4/27 21/     CHF (congestive heart failure) (HCC)    dx-on meds to treat   Chronic bronchitis (HCC)    used to get it q yr; last time ?2008 (04/28/2012)   Clotting disorder (HCC) 2019   On blood thinner when I bleed it is for a consideral amount of time. This is tegardless of size of wound.   COPD (chronic obstructive pulmonary disease) (HCC)    agent orange exposure   Degenerative disk disease    everywhere (04/28/2012)   Depression due to physical illness 12/15/2023   Diverticulitis    Dysrhythmia    afib   Elevated uric acid in blood    takes Allopurinol  daily   Emphysema of lung (HCC)    Enlarged prostate    but not on any meds   GERD (gastroesophageal reflux disease)    takes Protonix  daily   H/O hiatal hernia    Headache    History of colonoscopy    History of pneumonia    a. 2010   Hyperlipidemia    takes Lipitor daily; pt. states he takes a preventive   Hypothyroidism    Joint pain    Joint swelling    Leukocytosis    MSSA bacteremia 03/19/2023   Nausea and vomiting 11/12/2022   Neuromuscular disorder  (HCC)    bilat legs, and bilat arms   Neuropathy    Osteoporosis 2012   PAF (paroxysmal atrial fibrillation) (HCC)    a. Dx 04/2012, CHA2DS2VASc = 1 (age);  b. 04/2012 Echo: EF 40-50%, mild MR.   Personal history of colonic adenomas 02/09/2013   Pneumonia    Sepsis (HCC) 03/18/2023   Shortness of breath dyspnea    Thyroid  disease    no taking meds-caused dizziness    Assessment: Patient Reported Symptoms: Cognitive Cognitive Status: Unable to Assess (patient unavailable and is working with PT - wife completed call)      Neurological Neurological Review of Symptoms: Not assessed Oher Neurological Symptoms/Conditions [RPT]: patient unavailable and is working with PT - wife completed call    HEENT HEENT Symptoms Reported: Not assessed HEENT Comment: patient unavailable and is working with PT - wife completed call    Cardiovascular Cardiovascular Symptoms Reported: Not assessed Cardiovascular Comment: patient unavailable and is working with PT - wife completed call  Respiratory Respiratory Symptoms Reported: Not assesed Other Respiratory Symptoms: patient unavailable and is working with PT - wife completed call  Endocrine Endocrine Symptoms Reported: Not assessed Endocrine Comment: patient unavailable and is working with PT - wife completed call  Gastrointestinal Gastrointestinal Symptoms Reported: Not assessed Additional Gastrointestinal Details: patient unavailable and is working with PT - wife completed call      Genitourinary Genitourinary Symptoms Reported: Not assessed Additional Genitourinary Details: patient unavailable and is working with PT - wife completed call    Integumentary Additional Integumentary Details: Wife states patient is back to using compression sock - states dots on shin are no longer bleeding Skin Self-Management Outcome: 3 (uncertain)  Musculoskeletal Musculoskelatal Symptoms Reviewed: Not assessed Additional Musculoskeletal Details: patient  unavailable and is working with PT - wife completed call        Psychosocial Psychosocial Symptoms Reported: Not assessed Additional Psychological Details: patient unavailable and is working with PT - wife completed call         There were no vitals filed for this visit.  Medications Reviewed Today     Reviewed by Lauro Shona LABOR, RN (Registered Nurse) on 01/07/24 at 1122  Med List Status: <None>   Medication Order Taking? Sig Documenting Provider Last Dose Status Informant  acetaminophen  (TYLENOL ) 325 MG tablet [id number] Yes Take 1-2 tablets (325-650 mg total) by mouth every 6 (six) hours as needed for mild pain (pain score 1-3) (or temp > 100.5). Maurice Sharlet RAMAN, PA-C  Active Self, Spouse/Significant Other  albuterol  (VENTOLIN  HFA) 108 (90 Base) MCG/ACT inhaler [id number] Yes INHALE 2 PUFFS BY MOUTH EVERY 4 HOURS AS NEEDED FOR WHEEZE OR FOR SHORTNESS OF BREATH Young, Clinton D, MD  Active Spouse/Significant Other, Self  apixaban  (ELIQUIS ) 5 MG TABS tablet [id number] Yes Take 1 tablet (5 mg total) by mouth 2 (two) times daily. Maurice Sharlet RAMAN, PA-C  Active Self, Spouse/Significant Other  atorvastatin  (LIPITOR) 20 MG tablet [id number] Yes Take 1 tablet (20 mg total) by mouth daily. Jeffrie Oneil BROCKS, MD  Active Self, Spouse/Significant Other  cyclobenzaprine  (FLEXERIL ) 10 MG tablet [id number] Yes TAKE 1 TABLET BY MOUTH THREE TIMES A DAY AS NEEDED FOR MUSCLE SPASMS Thekkekandam, Thomas J, MD  Active   diltiazem  (CARDIZEM  CD) 180 MG 24 hr capsule [id number] Yes Take 1 capsule (180 mg total) by mouth daily. Jeffrie Oneil BROCKS, MD  Active Spouse/Significant Other, Self  DULoxetine  (CYMBALTA ) 30 MG capsule [id number] Yes TAKE 1 CAPSULE BY MOUTH EVERY DAY Curtis Debby PARAS, MD  Active   feeding supplement (ENSURE PLUS HIGH PROTEIN) LIQD [id number]  Take 237 mLs by mouth 2 (two) times daily between meals.  Patient not taking: Reported on 12/30/2023   Raenelle Donalda HERO, MD  Active Self, Spouse/Significant  Other  ferrous gluconate  (FERGON) 324 MG tablet [id number] Yes Take 1 tablet (324 mg total) by mouth daily. Maurice Sharlet RAMAN, PA-C  Active Self, Spouse/Significant Other  furosemide  (LASIX ) 40 MG tablet [id number] Yes Take 0.5 tablets (20 mg total) by mouth daily as needed for edema. Franchot Novel, MD  Active   hydrocerin (EUCERIN) CREA [id number]  Apply 1 Application topically daily. To left foot  Patient not taking: Reported on 01/07/2024   Maurice Sharlet RAMAN, PA-C  Active Self, Spouse/Significant Other  ipratropium-albuterol  (DUONEB) 0.5-2.5 (3) MG/3ML SOLN [id number] Yes 1 NEBULE EVERY 6 HOURS AS NEEDED. **[id number] Neysa Rama D, MD  Active Spouse/Significant Other, Self  isosorbide  mononitrate (IMDUR ) 30 MG 24 hr tablet [id number] Yes Take 1 tablet (30 mg total) by mouth daily. Jeffrie Oneil BROCKS, MD  Active Self, Spouse/Significant Other  melatonin 5 MG TABS [id number] Yes Take  1 tablet (5 mg total) by mouth at bedtime as needed. Maurice Sharlet RAMAN, PA-C  Active Self, Spouse/Significant Other  Multiple Vitamin (MULTIVITAMIN WITH MINERALS) TABS tablet [id number] Yes Take 1 tablet by mouth daily. Maurice Sharlet RAMAN, PA-C  Active Self, Spouse/Significant Other  Olodaterol HCl (STRIVERDI RESPIMAT) 2.5 MCG/ACT AERS [id number] Yes INHALE 2 PUFFS BY MOUTH INTO THE LUNGS DAILY Neysa Reggy BIRCH, MD  Active Spouse/Significant Other, Self  oxyCODONE  (OXY IR/ROXICODONE ) 5 MG immediate release tablet [id number] Yes Take 1-2 tablets (5-10 mg total) by mouth every 6 (six) hours as needed for severe pain (pain score 7-10) (5 mg for pain level 5-7 and 10 mg for pain > level 7). Maurice Sharlet RAMAN, PA-C  Active Self, Spouse/Significant Other  pantoprazole  (PROTONIX ) 40 MG tablet [id number] Yes Take 1 tablet (40 mg total) by mouth 2 (two) times daily before a meal. Love, Pamela S, PA-C  Active Self, Spouse/Significant Other  polyethylene glycol (MIRALAX  / GLYCOLAX ) 17 g packet [id number]  Take 17 g by mouth daily.  Patient not taking:  Reported on 01/07/2024   Raenelle Donalda HERO, MD  Active Self, Spouse/Significant Other  predniSONE  (DELTASONE ) 5 MG tablet [id number] Yes 3 tabs daily Neysa Reggy D, MD  Active Self, Spouse/Significant Other  pregabalin  (LYRICA ) 100 MG capsule [id number] Yes Take 1 capsule (100 mg total) by mouth 2 (two) times daily. Maurice Sharlet RAMAN, PA-C  Active Self, Spouse/Significant Other  tamsulosin  (FLOMAX ) 0.4 MG CAPS capsule [id number] Yes Take 1 capsule (0.4 mg total) by mouth at bedtime. Maurice Sharlet RAMAN, PA-C  Active Self, Spouse/Significant Other            Recommendation:   Continue Current Plan of Care  Follow Up Plan:   Telephone follow up appointment date/time:  01/16/24/11am  Shona Prow RN, CCM [place]  [facility] RN Care Manager [phone]

## 2024-01-09 DIAGNOSIS — I110 Hypertensive heart disease with heart failure: Secondary | ICD-10-CM | POA: Diagnosis not present

## 2024-01-09 DIAGNOSIS — J189 Pneumonia, unspecified organism: Secondary | ICD-10-CM | POA: Diagnosis not present

## 2024-01-09 DIAGNOSIS — J440 Chronic obstructive pulmonary disease with acute lower respiratory infection: Secondary | ICD-10-CM | POA: Diagnosis not present

## 2024-01-09 DIAGNOSIS — J439 Emphysema, unspecified: Secondary | ICD-10-CM | POA: Diagnosis not present

## 2024-01-09 DIAGNOSIS — I480 Paroxysmal atrial fibrillation: Secondary | ICD-10-CM | POA: Diagnosis not present

## 2024-01-09 DIAGNOSIS — I5042 Chronic combined systolic (congestive) and diastolic (congestive) heart failure: Secondary | ICD-10-CM | POA: Diagnosis not present

## 2024-01-11 ENCOUNTER — Encounter (INDEPENDENT_AMBULATORY_CARE_PROVIDER_SITE_OTHER): Payer: Self-pay | Admitting: Sports Medicine

## 2024-01-11 DIAGNOSIS — Z89511 Acquired absence of right leg below knee: Secondary | ICD-10-CM

## 2024-01-11 DIAGNOSIS — M79604 Pain in right leg: Secondary | ICD-10-CM

## 2024-01-12 MED ORDER — HYDROCODONE-ACETAMINOPHEN 10-325 MG PO TABS: 1.0000 | ORAL_TABLET | Freq: Three times a day (TID) | ORAL | 0 refills | Status: DC | PRN

## 2024-01-12 NOTE — Telephone Encounter (Signed)
 Please see the MyChart message reply(ies) for my assessment and plan.    This patient gave consent for this Medical Advice Message and is aware that it may result in a bill to [employer], as well as the possibility of receiving a bill for a co-payment or deductible. They are an established patient, but are not seeking medical advice exclusively about a problem treated during an in person or video visit in the last seven days. I did not recommend an in person or video visit within seven days of my reply.  I spent 5 total minutes of online digital evaluation and management services in this patient-initiated request for online care.

## 2024-01-12 NOTE — Assessment & Plan Note (Signed)
 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 At his initial visit the stump looked good, there was no erythema, induration, warmth, he had an area of skin breakdown which was healing well. He continues with a shrinker, and he is getting set up for a prosthesis. He is complaining of increasing pain that I suspect may be more of a phantom type pain considering along its been since his amputation procedure. Will refill his hydrocodone  but I suspect for future treatment we will need to consider more of a neuropathic approach such as increasing his Lyrica .

## 2024-01-13 ENCOUNTER — Telehealth: Payer: Self-pay | Admitting: Sports Medicine

## 2024-01-13 DIAGNOSIS — I480 Paroxysmal atrial fibrillation: Secondary | ICD-10-CM | POA: Diagnosis not present

## 2024-01-13 DIAGNOSIS — J189 Pneumonia, unspecified organism: Secondary | ICD-10-CM | POA: Diagnosis not present

## 2024-01-13 DIAGNOSIS — I110 Hypertensive heart disease with heart failure: Secondary | ICD-10-CM | POA: Diagnosis not present

## 2024-01-13 DIAGNOSIS — J439 Emphysema, unspecified: Secondary | ICD-10-CM | POA: Diagnosis not present

## 2024-01-13 DIAGNOSIS — I5042 Chronic combined systolic (congestive) and diastolic (congestive) heart failure: Secondary | ICD-10-CM | POA: Diagnosis not present

## 2024-01-13 DIAGNOSIS — J440 Chronic obstructive pulmonary disease with acute lower respiratory infection: Secondary | ICD-10-CM | POA: Diagnosis not present

## 2024-01-13 NOTE — Telephone Encounter (Signed)
 James with [name] stated the patients pulse is running below 60 in every visit, TID. Wanted to know if it was okay to only call in if the pt is symptomatic since the patient stated it was within his normal range. Got an ok from Christal N. CMA

## 2024-01-14 DIAGNOSIS — I110 Hypertensive heart disease with heart failure: Secondary | ICD-10-CM | POA: Diagnosis not present

## 2024-01-14 DIAGNOSIS — J439 Emphysema, unspecified: Secondary | ICD-10-CM | POA: Diagnosis not present

## 2024-01-14 DIAGNOSIS — J189 Pneumonia, unspecified organism: Secondary | ICD-10-CM | POA: Diagnosis not present

## 2024-01-14 DIAGNOSIS — I480 Paroxysmal atrial fibrillation: Secondary | ICD-10-CM | POA: Diagnosis not present

## 2024-01-14 DIAGNOSIS — I5042 Chronic combined systolic (congestive) and diastolic (congestive) heart failure: Secondary | ICD-10-CM | POA: Diagnosis not present

## 2024-01-14 DIAGNOSIS — J440 Chronic obstructive pulmonary disease with acute lower respiratory infection: Secondary | ICD-10-CM | POA: Diagnosis not present

## 2024-01-15 ENCOUNTER — Encounter: Attending: Physical Medicine & Rehabilitation | Admitting: Physical Medicine & Rehabilitation

## 2024-01-15 ENCOUNTER — Encounter: Payer: Self-pay | Admitting: Physician Assistant

## 2024-01-15 ENCOUNTER — Encounter: Payer: Self-pay | Admitting: Physical Medicine & Rehabilitation

## 2024-01-15 ENCOUNTER — Ambulatory Visit (INDEPENDENT_AMBULATORY_CARE_PROVIDER_SITE_OTHER): Admitting: Physician Assistant

## 2024-01-15 VITALS — BP 120/73 | HR 42 | Ht 72.0 in | Wt 185.0 lb

## 2024-01-15 DIAGNOSIS — G629 Polyneuropathy, unspecified: Secondary | ICD-10-CM | POA: Diagnosis not present

## 2024-01-15 DIAGNOSIS — G548 Other nerve root and plexus disorders: Secondary | ICD-10-CM | POA: Diagnosis not present

## 2024-01-15 DIAGNOSIS — Z89511 Acquired absence of right leg below knee: Secondary | ICD-10-CM | POA: Insufficient documentation

## 2024-01-15 DIAGNOSIS — R52 Pain, unspecified: Secondary | ICD-10-CM | POA: Diagnosis not present

## 2024-01-15 DIAGNOSIS — E875 Hyperkalemia: Secondary | ICD-10-CM | POA: Diagnosis not present

## 2024-01-15 DIAGNOSIS — R29898 Other symptoms and signs involving the musculoskeletal system: Secondary | ICD-10-CM | POA: Diagnosis not present

## 2024-01-15 MED ORDER — PREGABALIN 150 MG PO CAPS: 150.0000 mg | ORAL_CAPSULE | Freq: Two times a day (BID) | ORAL | 2 refills | Status: AC

## 2024-01-15 NOTE — Progress Notes (Signed)
 Subjective:    Patient ID: Levi [name], male    DOB: 1945-07-15, 78 y.o.   MRN: [id number]  HPI  Hospital Discharge Summary 12/05/23  Brief HPI:   Levi [name] is a 78 y.o. male with history of PAF, HFpEF, HTN, COPD, agent orange exposure, BPH, anemia, left 2nd toe and right 5th digit amputation on 08/13/23 and R foot exostectomy 5/22 who presented to ED 11/08/23 with sepsis due to RLE cellulitis and concerns of osteomyelitis. He was started on broad spectrum antibiotics and MRI right fot confirmed osteomyelitis with Charcot degeneration. He underwent R-BKA by Dr. Harden on 6/25 and antibiotics discontinued 24 hours post op. Therapy evaluation revealed deficits in mobility and ADls and CIR recommended due to functional decline.        Hospital Course: Levi [name] was admitted to rehab 11/16/2023 for inpatient therapies to consist of PT and OT at least three hours five days a week. Past admission physiatrist, therapy team and rehab RN have worked together to provide customized collaborative inpatient rehab. His blood pressures were monitored on TID basis and has been stable. He was maintained on home dose prednisone  and advised to follow up with pulmonary for input as reported difficulty with recommended taper.  Furosemide  was resumed with follow up check of BMET showed hypokalemia. K dur  was increased to 40 mEq with follow-up check of electrolytes showing hypokalemia to have resolved..  Furosemide  decreased to 20 mEq a day on 06/25 due to prerenal azotemia.  Imdur  remains on hold and he is to follow-up with cardiology for input on resumption.  No cardiac symptoms reported with increase in activity.     GERD symptoms have been controlled on Protonix  twice daily.  Follow-up CBC shows ABLA is slowly improving.  Bowel program was augmented due to issues with constipation initially.  Laxatives were DC'd due to loose BMs and he was advised to adjust MiraLAX  as needed for constipation.  He has completed  vitamin B12 injections weekly on 7/11 per recommendations and is to follow-up with PCP for input on monthly injections for supplementation. He reported right shoulder pain and X rays done revealed rotator cuff arthropathy with OA of glenohumeral joint.  Shoulder was injected on 07/11 with Kenalog  with positive results.  He is to continue range of motion and activities as tolerated.  Eucerin was added to left foot for moisturization.  BKA incision is C/D/I and is healing well without any signs or symptoms of infection. He has made gains during his stay and requires CGA to min assist overall. He will continue to receive follow up HHPT, HHRN and HHOT by  Levi [name] after discharge.      Rehab course: During patient's stay in rehab weekly team conferences were held to monitor patient's progress, set goals and discuss barriers to discharge. At admission, patient required max assist with mobility and max to total assist with ADLs.  He  has had improvement in activity tolerance, balance, postural control as well as ability to compensate for deficits. He requires CGA to supervision for ADL tasks. He is independent for bed mobility and requires supervision with cues for SB transfers. He requires mod assist for squat pivot transfers. He is able to propel his wheelchair with supervision. Family education has been completed.     Visit 01/15/24 Reports ongoing pain at residual limb site described as burning sensation. Pain varies in intensity - sometimes mild, other times significant. Experiences both residual limb pain and phantom  limb pain.   Recently hospitalized few weeks ago for possible wound infection. Received mild antibiotics. Final analysis suggested possible slight pneumonia but nothing definitive.  Started duloxetine  (Cymbalta ) 30mg  daily approximately 2 weeks ago per PCP. Unsure if noticeable benefit yet but understands medication needs time to work. Continues pregabalin  (Lyrica ) 100mg  Twice  daily. Avoids narcotic pain medications when possible. Has hydrocodone  available from prior admission but rarely uses.   Pain Inventory Average Pain 6 Pain Right Now 5 My pain is intermittent, sharp, burning, stabbing, tingling, and aching  In the last 24 hours, has pain interfered with the following? General activity 1 Relation with others 0 Enjoyment of life 4 What TIME of day is your pain at its worst? morning  Sleep (in general) Fair  Pain is worse with: sitting Pain improves with: rest and therapy/exercise Relief from Meds: 7  how many minutes can you walk? 0 ability to climb steps?  no do you drive?  no use a wheelchair needs help with transfers Do you have any goals in this area?  no  retired  weakness numbness tingling trouble walking spasms dizziness confusion depression  Any changes since last visit?  no  PCP:  Debby Petties, MD Orthopedics:  Levi Gavel Neurosurgeon:  Victory Gens, MD Cardiologist:  Oneil Parchment, MD   Family History  Problem Relation Age of Onset   Diabetes Mother    Heart disease Father        Died 36, MI   Cancer Father    Hyperlipidemia Father    Stroke Father    Prostate cancer Brother    Colon polyps Brother    Diabetes Brother    Colon polyps Brother    Asthma Brother    COPD Brother    Colon cancer Brother        Dx age 78   Cancer Brother    Colon cancer Other    Esophageal cancer Neg Hx    Rectal cancer Neg Hx    Stomach cancer Neg Hx    Pancreatic cancer Neg Hx    Kidney disease Neg Hx    Liver disease Neg Hx    Neuropathy Neg Hx    Social History   Socioeconomic History   Marital status: Married    Spouse name: [name]   Number of children: 1   Years of education: 16   Highest education level: Bachelor's degree (e.g., BA, AB, BS)  Occupational History   Occupation: Retired    Comment: Nurse, 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 Daughter lives two miles away. 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 Enjoys being active and working.      Tobacco use, amount per day now: None   Past tobacco use, amount per day: None   How many years did you use tobacco:   Alcohol use (drinks per week): None   Diet:   Do you drink/eat things with caffeine : Coffee   Marital status:  Married  What year were you married? 1973   Do you live in a house, apartment, assisted living, condo, trailer, etc.? House    Is it one or more stories? 3 stories   How many persons live in your home? 2   Do you have pets in your home?( please list) 3 [employer]    Highest Level of education completed? Engineer, maintenance (IT).   Current or past profession: Police    Do you exercise?    Yes                              Type and how often? 5 times weekly   Do you have a living will? Yes   Do you have a DNR form?     Yes                              If not, do you want to discuss one?   Do you have signed POA/HPOA forms?    Yes                    If so, please bring to you appointment      Do you have any difficulty bathing or dressing yourself?    Do you have any difficulty preparing food or eating? No   Do you have any difficulty managing your medications? No   Do you have any difficulty managing your finances? No   Do you have any difficulty affording your medications? No   Social Drivers of Corporate investment banker Strain: Low Risk  (10/23/2023)   Overall Financial Resource Strain (CARDIA)    Difficulty of Paying Living Expenses: Not hard at all  Food Insecurity: No  Food Insecurity (12/30/2023)   Hunger Vital Sign    Worried About Running Out of Food in the Last Year: Never true    Ran Out of Food in the Last Year: Never true  Transportation Needs: No Transportation Needs (12/30/2023)   PRAPARE - Administrator, Civil Service (Medical): No    Lack of Transportation (Non-Medical): No  Physical Activity: Sufficiently Active (10/23/2023)   Exercise Vital Sign    Days of Exercise per Week: 5 days    Minutes of Exercise per Session: 60 min  Stress: No Stress Concern Present (10/23/2023)   [employer] of Occupational Health - Occupational Stress Questionnaire    Feeling of Stress : Not at all  Social Connections: Patient Declined (12/26/2023)   Social Connection and Isolation Panel    Frequency of Communication with Friends and Family: Patient declined    Frequency of Social Gatherings with Friends and Family: Patient declined    Attends Religious Services: Patient declined    Database administrator or Organizations: Patient declined    Attends Banker Meetings: Patient declined    Marital Status: Patient declined   Past Surgical History:  Procedure Laterality Date   24 HOUR PH STUDY N/A 05/30/2021   Procedure: 24 HOUR PH STUDY;  Surgeon: San Sandor GAILS, DO;  Location: [facility];  Service: Gastroenterology;  Laterality: N/A;   AMPUTATION Right 11/14/2023   Procedure: AMPUTATION BELOW KNEE;  Surgeon: Harden Jerona GAILS, MD;  Location: [facility] OR;  Service: Orthopedics;  Laterality: Right;   AMPUTATION TOE Bilateral 08/13/2023   Procedure: LEFT SECOND TOE AMPUTATION AND RIGHT FIFTH TOE AMPUTATION;  Surgeon: Malvin,  Marsa FALCON, DPM;  Location: [facility] OR;  Service: Orthopedics/Podiatry;  Laterality: Bilateral;   ANTERIOR CERVICAL DECOMP/DISCECTOMY FUSION N/A 07/21/2012   Procedure: ANTERIOR CERVICAL DECOMPRESSION/DISCECTOMY FUSION 2 LEVELS;  Surgeon: Victory Gens, MD;  Location: [facility];  Service: Neurosurgery;  Laterality: N/A;   C4-5 C5-6 Anterior cervical decompression/diskectomy/fusion   ANTERIOR LAT LUMBAR FUSION N/A 04/07/2017   Procedure: Thoracic twelve-Lumbar one Anterolateral decompression/fusion;  Surgeon: Gens Victory, MD;  Location: [facility] OR;  Service: Neurosurgery;  Laterality: N/A;   ARTHRODESIS FOOT WITH WEIL OSTEOTOMY Left 11/07/2022   Procedure: LEFT THIRD METATARSOPHALANGEAL CAPSULOTOMY, WEIL OSTEOTOMY;  Surgeon: Elsa Lonni SAUNDERS, MD;  Location[facility] OR;  Service: Orthopedics;  Laterality: Left;  LENGTH OF SURGERY: 60 MINUTES   BACK SURGERY     x 3   BELOW KNEE LEG AMPUTATION Right    June 2025   BONE EXOSTOSIS EXCISION Right 10/16/2023   Procedure: EXCISION, EXOSTOSIS;  Surgeon: Janit Thresa HERO, DPM;  Location: [facility];  Service: Orthopedics/Podiatry;  Laterality: Right;   CARDIAC CATHETERIZATION  11/2008   Dr. Jeffrie - 20% calcified non flow limiting left main, 50% EF apical hypokinesis   CARPAL TUNNEL RELEASE Bilateral    CERVICAL DISC ARTHROPLASTY N/A 04/07/2017   Procedure: Cervical six-seven Disc arthroplasty;  Surgeon: Gens Victory, MD;  Location: [facility] OR;  Service: Neurosurgery;  Laterality: N/A;   CHEST TUBE INSERTION Left 04/11/2017   Procedure: CHEST TUBE INSERTION;  Surgeon: Fleeta Hanford Coy, MD;  Location: [facility] OR;  Service: Thoracic;  Laterality: Left;   CHOLECYSTECTOMY     COLONOSCOPY  2016   CG-MAC-miralax (good)servere TICS/TA x 2   ESOPHAGEAL MANOMETRY N/A 05/30/2021   Procedure: ESOPHAGEAL MANOMETRY (EM);  Surgeon: San Sandor GAILS, DO;  Location: [facility];  Service: Gastroenterology;  Laterality: N/A;  ph impedence   ESOPHAGOGASTRODUODENOSCOPY     EYE SURGERY Bilateral 2011   steroidal encapsulation (04/28/2012)   FOOT SURGERY Right    x 2   JOINT REPLACEMENT     Many surgeries including 5 joit replacements   KNEE ARTHROSCOPY Bilateral    LATERAL / POSTERIOR COMBINED FUSION LUMBAR SPINE  2012   LEFT ATRIAL APPENDAGE OCCLUSION N/A 02/22/2021   Procedure: LEFT ATRIAL  APPENDAGE OCCLUSION;  Surgeon: Cindie Ole DASEN, MD;  [facility];  Service: Cardiovascular;  Laterality: N/A;   NEUROPLASTY / TRANSPOSITION ULNAR NERVE AT ELBOW Bilateral    POLYPECTOMY  2016   severe TICS/TA x 2   POSTERIOR CERVICAL LAMINECTOMY WITH MET- RX Right 06/11/2021   Procedure: Right Cervical six-seven  Laminectomy and foraminotomy with metrex;  Surgeon: Gens Victory, MD;  Location: [facility];  Service: Neurosurgery;  Laterality: Right;   REFRACTIVE SURGERY Left 07/07/2023   laser surgery but still has floaters   REVERSE SHOULDER ARTHROPLASTY  04/28/2012   Procedure: REVERSE SHOULDER ARTHROPLASTY;  Surgeon: Eva Elsie Herring, MD;  Location: [facility] OR;  Service: Orthopedics;  Laterality: Left;  Left shouder reverse total shoulder arthroplasty   SHOULDER SURGERY     SPINE SURGERY     I have had 7 spinal surgeries   TEE WITHOUT CARDIOVERSION N/A 02/22/2021   Procedure: TRANSESOPHAGEAL ECHOCARDIOGRAM (TEE);  Surgeon: Cindie Ole DASEN, MD;  [facility] INVASIVE CV LAB;  Service: Cardiovascular;  Laterality: N/A;   TENDON REPAIR Right 08/07/2020   Procedure: RIGHT HAND LIGAMENT RECONSTRUCTION AND TENDON INTERPOSITION;  Surgeon: Yvone Rush, MD;  Location: [facility];  Service: Orthopedics;  Laterality: Right;   TONSILLECTOMY AND ADENOIDECTOMY  1954  TOTAL HIP ARTHROPLASTY Left 2011   left (04/28/2012)   TOTAL HIP ARTHROPLASTY Right 07/31/2015   Procedure: TOTAL HIP ARTHROPLASTY ANTERIOR APPROACH;  Surgeon: Levi Gavel, MD;  Location[facility];  Service: Orthopedics;  Laterality: Right;   TOTAL KNEE ARTHROPLASTY  01/10/2012   Procedure: TOTAL KNEE ARTHROPLASTY;  Surgeon: Levi LITTIE Gavel, MD;  Location: [facility] OR;  Service: Orthopedics;;  left total knee arthroplasty   TOTAL KNEE ARTHROPLASTY Right 06/17/2014   Procedure: TOTAL KNEE ARTHROPLASTY;  Surgeon: Levi LITTIE Gavel, MD;  Location: [facility];  Service: Orthopedics;  Laterality: Right;   toupet  fundolplication  11/01/2021   TRANSESOPHAGEAL ECHOCARDIOGRAM (CATH LAB) N/A 03/24/2023   Procedure: TRANSESOPHAGEAL ECHOCARDIOGRAM;  Surgeon: Mona Vinie BROCKS, MD;  [facility]: [facility];  Service: Cardiovascular;  Laterality: N/A;   Past Medical History:  Diagnosis Date   Agent orange exposure 1970's   takes Imdur ,Diltiazem , and Atacand  daily   Agent orange exposure    ALLERGIC RHINITIS    Allergy     seasonal allergies   Anemia    iron  deficiency   Arthritis    Asthma    uses inhaler and nebulizers   Atrial fibrillation (HCC)    Bruises easily    d/t prednisone  daily   Cataract    Cellulitis 03/22/2020   Chest wall pain 09/21/2019   L scapula, reported 4/27 21/     CHF (congestive heart failure) (HCC)    dx-on meds to treat   Chronic bronchitis (HCC)    used to get it q yr; last time ?2008 (04/28/2012)   Clotting disorder (HCC) 2019   On blood thinner when I bleed it is for a consideral amount of time. This is tegardless of size of wound.   COPD (chronic obstructive pulmonary disease) (HCC)    agent orange exposure   Degenerative disk disease    everywhere (04/28/2012)   Depression due to physical illness 12/15/2023   Diverticulitis    Dysrhythmia    afib   Elevated uric acid in blood    takes Allopurinol  daily   Emphysema of lung (HCC)    Enlarged prostate    but not on any meds   GERD (gastroesophageal reflux disease)    takes Protonix  daily   H/O hiatal hernia    Headache    History of colonoscopy    History of pneumonia    a. 2010   Hyperlipidemia    takes Lipitor daily; pt. states he takes a preventive   Hypothyroidism    Joint pain    Joint swelling    Leukocytosis    MSSA bacteremia 03/19/2023   Nausea and vomiting 11/12/2022   Neuromuscular disorder (HCC)    bilat legs, and bilat arms   Neuropathy    Osteoporosis 2012   PAF (paroxysmal atrial fibrillation) (HCC)    a. Dx 04/2012, CHA2DS2VASc = 1 (age);  b. 04/2012 Echo: EF 40-50%, mild  MR.   Personal history of colonic adenomas 02/09/2013   Pneumonia    Sepsis (HCC) 03/18/2023   Shortness of breath dyspnea    Thyroid  disease    no taking meds-caused dizziness   BP 120/73 (BP Location: Left Arm, Patient Position: Sitting, Cuff Size: Large)   Pulse (!) 42   Ht 6' (1.829 m)   Wt 185 lb (83.9 kg)   SpO2 94%   BMI 25.09 kg/m   Opioid Risk Score:   Fall Risk Score:  `1  Depression screen [facility] 2/9  01/15/2024    9:43 AM 12/30/2023    9:51 AM 10/23/2023    8:13 AM 06/17/2023   10:58 AM 06/17/2023   10:57 AM 06/17/2023   10:32 AM 03/12/2023    8:51 AM  Depression screen PHQ 2/9  Decreased Interest 1 0 0 0 0 0 0  Down, Depressed, Hopeless 1 0 0 1 1 0 0  PHQ - 2 Score 2 0 0 1 1 0 0  Altered sleeping 2   2     Tired, decreased energy 2   2     Change in appetite 1   2     Feeling bad or failure about yourself  1   0     Trouble concentrating 1   0     Moving slowly or fidgety/restless 0   0     Suicidal thoughts 1   0     PHQ-9 Score 10   7     Difficult doing work/chores Somewhat difficult   Somewhat difficult         Review of Systems  Constitutional:  Positive for chills and fever.  Respiratory:  Positive for cough.   Cardiovascular:        AFIB  Musculoskeletal:        Right shoulder pain, low back pain, right knee pain  Skin:  Positive for rash.       Skin rash and breakkdown  Neurological:  Positive for weakness and numbness.  Hematological:  Bruises/bleeds easily.       Easy bruising and bleeding  Psychiatric/Behavioral:  Positive for confusion.        Depression   All other systems reviewed and are negative.      Objective:   Physical Exam  Gen: no distress, sitting in WC HEENT: oral mucosa pink and moist, NCAT Chest: normal effort, normal rate of breathing Abd: soft, non-distended Psych: pleasant, normal affect Skin: intact Neuro: Alert and awake, follows commands, cranial nerves II through XII grossly intact, normal speech and  language RUE: 3/5 Deltoid-chronic , 5/5 Biceps, 5/5 Triceps, 5/5 Wrist Ext, 5/5 Grip LUE: 5/5 Deltoid, 5/5 Biceps, 5/5 Triceps, 5/5 Wrist Ext, 5/5 Grip RLE: HF 5/5, KE 5/5 LLE: HF 5/5, KE 5/5, ADF 5/5, APF 5/5 Sensory exam normal for light touch decreased LLE No abnormal tone noted Musculoskeletal:  R BKA Mild R shoulder TTP Slight open area on incision line, no signed of infection noted/retained suture Wearing shrinker         Assessment & Plan:    1)  R BKA d/t osteomyelitis 11/14/23 by Dr. Harden, hx of charcot joint             -- Seeing surgeon Dr. Harden this afternoon for evaluation     - Photo documentation added to chart              - He is Scheduled for prosthetic fitting end of next week- has ortho f/u today for clearance   - Continue current PT/OT program   Continue duloxetine  30mg  daily     - Increase pregabalin  from 100mg  BID to 150mg  BID for phantom pain  2) Right weakness- chronic years.  Pt had kenalog  injection during CIR- minimal benefit reported. Likely chronic rotator cuff tear.    3) hypo/hyperkalemia -Pt following with PCP, reports stopped K+ supplement  4) HTN  -Stable today, continue PCP f/u    5) Constipation: Reports regular BMs

## 2024-01-15 NOTE — Progress Notes (Signed)
 Office Visit Note   Patient: Levi [name]           Date of Birth: Feb 02, 1946           MRN: [id number] Visit Date: 01/15/2024              Requested by: Curtis Debby PARAS, MD [address],  [place] [address] PCP: Curtis Debby PARAS, MD  Chief Complaint  Patient presents with   Right Leg - Routine Post Op    Right BKA      HPI: Patient is a 78 year old gentleman who is  status post right below-knee amputation 11/14/23.  He has started the prosthetic follow up with [employer] and has a shrinker.  He is here today for follow up.  He had a retained suture medially.  This was removed.  He denies fever and chills.    Assessment & Plan: Visit Diagnoses:  1. Right below-knee amputee (HCC)     Plan: Vashe cleansing daily.  Reduce pressure on the left GT and 5th toe with wide box shoes.  They will let us  know when he gets his prosthetic so he can start OP PT gait training.  Follow-Up Instructions: Return if symptoms worsen or fail to improve.   Ortho Exam  Patient is alert, oriented, no adenopathy, well-dressed, normal affect, normal respiratory effort. The right BKA stump has minimal edema and superficial granulation under the suture removal area.  No cellulitis.  The stump is well healed.    He has a small area of pressure on the dorsum of the left great toe and very mild blanchable area of pressure over the fifth toe.  The left Gt dorsum was cleaned with Vashe and silver nitrate was used to stop pin point bleeding.      Imaging: No results found. No images are attached to the encounter.  Labs: Lab Results  Component Value Date   HGBA1C 5.8 (H) 07/05/2022   HGBA1C 5.5 10/21/2019   ESRSEDRATE 30 (H) 11/08/2023   ESRSEDRATE 11 10/08/2023   ESRSEDRATE 17 03/31/2023   CRP 21.0 (H) 11/10/2023   CRP 16.5 (H) 11/08/2023   CRP 11.7 (H) 03/31/2023   LABURIC 5.6 06/17/2023   LABURIC 6.4 07/05/2022   REPTSTATUS 12/31/2023 FINAL 12/26/2023    GRAMSTAIN NO WBC SEEN NO ORGANISMS SEEN  10/16/2023   CULT  12/26/2023    NO GROWTH 5 DAYS Performed at [facility], [address], [place], [place] [address]    [facility] STAPHYLOCOCCUS COHNII 10/16/2023     Lab Results  Component Value Date   ALBUMIN 3.8 01/01/2024   ALBUMIN 2.4 (L) 12/27/2023   ALBUMIN 3.2 (L) 12/26/2023    Lab Results  Component Value Date   MG 1.8 12/28/2023   MG 1.7 11/10/2023   MG 2.1 08/13/2023   No results found for: VD25OH  No results found for: PREALBUMIN    Latest Ref Rng & Units 01/01/2024    2:45 PM 12/29/2023    7:07 AM 12/28/2023    6:06 AM  CBC EXTENDED  WBC 3.4 - 10.8 x10E3/uL 10.9  8.3  8.7   RBC 4.14 - 5.80 x10E6/uL 4.27  3.93  3.74   Hemoglobin 13.0 - 17.7 g/dL 87.7  88.4  89.1   HCT 37.5 - 51.0 % 39.5  36.3  34.6   Platelets 150 - 450 x10E3/uL 192  141  119   NEUT# 1.4 - 7.0 x10E3/uL 9.4  Lymph# 0.7 - 3.1 x10E3/uL 0.7        There is no height or weight on file to calculate BMI.  Orders:  No orders of the defined types were placed in this encounter.  No orders of the defined types were placed in this encounter.    Procedures: No procedures performed  Clinical Data: No additional findings.  ROS:  All other systems negative, except as noted in the HPI. Review of Systems  Objective: Vital Signs: There were no vitals taken for this visit.  Specialty Comments:  No specialty comments available.  PMFS History: Patient Active Problem List   Diagnosis Date Noted   Hyperkalemia 01/02/2024   CAP (community acquired pneumonia) 12/26/2023   Depression due to physical illness 12/15/2023   Hematochezia 07/25/2023   Closed fracture of ramus of right pubis (HCC) 04/07/2023   S/P PICC central line placement 03/31/2023   Chronic superficial venous thrombosis of both lower extremities 11/18/2022   Primary osteoarthritis, right shoulder 07/05/2022   Shingles 05/16/2022   Vertigo 04/04/2022   BPH (benign  prostatic hyperplasia) 02/21/2022   Annual physical exam 01/22/2022   History of right below knee amputation (HCC) 01/22/2022   Atypical chest pain    Chronic anticoagulation 03/15/2021   Peripheral neuropathy 02/22/2021   Chronic systolic heart failure (HCC) 02/22/2021   Essential (primary) hypertension 11/15/2020   Arthritis of carpometacarpal (CMC) joint of right thumb 08/07/2020   MIld Cellulitis of residual R limb at BKA site 03/22/2020   COPD (chronic obstructive pulmonary disease) (HCC) 03/21/2020   GERD (gastroesophageal reflux disease) status post Nissen fundoplication 12/03/2017   Lumbar stenosis status post T12-S1 fusion 04/07/2017   History of total right hip arthroplasty 07/31/2015   Primary osteoarthritis of right knee 06/17/2014   Hyperlipidemia    History of colonic polyps 02/09/2013   Paroxysmal atrial fibrillation (HCC) 05/11/2012   Osteoarthritis of left knee 01/10/2012   Seasonal and perennial allergic rhinitis 07/23/2010   Asthma, moderate persistent 03/06/2009   Past Medical History:  Diagnosis Date   Agent orange exposure 1970's   takes Imdur ,Diltiazem , and Atacand  daily   Agent orange exposure    ALLERGIC RHINITIS    Allergy     seasonal allergies   Anemia    iron  deficiency   Arthritis    Asthma    uses inhaler and nebulizers   Atrial fibrillation (HCC)    Bruises easily    d/t prednisone  daily   Cataract    Cellulitis 03/22/2020   Chest wall pain 09/21/2019   L scapula, reported 4/27 21/     CHF (congestive heart failure) (HCC)    dx-on meds to treat   Chronic bronchitis (HCC)    used to get it q yr; last time ?2008 (04/28/2012)   Clotting disorder (HCC) 2019   On blood thinner when I bleed it is for a consideral amount of time. This is tegardless of size of wound.   COPD (chronic obstructive pulmonary disease) (HCC)    agent orange exposure   Degenerative disk disease    everywhere (04/28/2012)   Depression due to physical illness  12/15/2023   Diverticulitis    Dysrhythmia    afib   Elevated uric acid in blood    takes Allopurinol  daily   Emphysema of lung (HCC)    Enlarged prostate    but not on any meds   GERD (gastroesophageal reflux disease)    takes Protonix  daily   H/O hiatal hernia    Headache  History of colonoscopy    History of pneumonia    a. 2010   Hyperlipidemia    takes Lipitor daily; pt. states he takes a preventive   Hypothyroidism    Joint pain    Joint swelling    Leukocytosis    MSSA bacteremia 03/19/2023   Nausea and vomiting 11/12/2022   Neuromuscular disorder (HCC)    bilat legs, and bilat arms   Neuropathy    Osteoporosis 2012   PAF (paroxysmal atrial fibrillation) (HCC)    a. Dx 04/2012, CHA2DS2VASc = 1 (age);  b. 04/2012 Echo: EF 40-50%, mild MR.   Personal history of colonic adenomas 02/09/2013   Pneumonia    Sepsis (HCC) 03/18/2023   Shortness of breath dyspnea    Thyroid  disease    no taking meds-caused dizziness    Family History  Problem Relation Age of Onset   Diabetes Mother    Heart disease Father        Died 56, MI   Cancer Father    Hyperlipidemia Father    Stroke Father    Prostate cancer Brother    Colon polyps Brother    Diabetes Brother    Colon polyps Brother    Asthma Brother    COPD Brother    Colon cancer Brother        Dx age 67   Cancer Brother    Colon cancer Other    Esophageal cancer Neg Hx    Rectal cancer Neg Hx    Stomach cancer Neg Hx    Pancreatic cancer Neg Hx    Kidney disease Neg Hx    Liver disease Neg Hx    Neuropathy Neg Hx     Past Surgical History:  Procedure Laterality Date   21 HOUR PH STUDY N/A 05/30/2021   Procedure: 24 HOUR PH STUDY;  Surgeon: San Sandor GAILS, DO;  Location: [facility];  Service: Gastroenterology;  Laterality: N/A;   AMPUTATION Right 11/14/2023   Procedure: AMPUTATION BELOW KNEE;  Surgeon: Harden Jerona GAILS, MD;  Location: [facility] OR;  Service: Orthopedics;  Laterality: Right;   AMPUTATION TOE  Bilateral 08/13/2023   Procedure: LEFT SECOND TOE AMPUTATION AND RIGHT FIFTH TOE AMPUTATION;  Surgeon: Malvin Marsa FALCON, DPM;  Location: [facility] OR;  Service: Orthopedics/Podiatry;  Laterality: Bilateral;   ANTERIOR CERVICAL DECOMP/DISCECTOMY FUSION N/A 07/21/2012   Procedure: ANTERIOR CERVICAL DECOMPRESSION/DISCECTOMY FUSION 2 LEVELS;  Surgeon: Victory Gens, MD;  Location: [facility];  Service: Neurosurgery;  Laterality: N/A;  C4-5 C5-6 Anterior cervical decompression/diskectomy/fusion   ANTERIOR LAT LUMBAR FUSION N/A 04/07/2017   Procedure: Thoracic twelve-Lumbar one Anterolateral decompression/fusion;  Surgeon: Gens Victory, MD;  Location: [facility] OR;  Service: Neurosurgery;  Laterality: N/A;   ARTHRODESIS FOOT WITH WEIL OSTEOTOMY Left 11/07/2022   Procedure: LEFT THIRD METATARSOPHALANGEAL CAPSULOTOMY, WEIL OSTEOTOMY;  Surgeon: Elsa Lonni SAUNDERS, MD;  Location: [facility] OR;  Service: Orthopedics;  Laterality: Left;  LENGTH OF SURGERY: 60 MINUTES   BACK SURGERY     x 3   BELOW KNEE LEG AMPUTATION Right    June 2025   BONE EXOSTOSIS EXCISION Right 10/16/2023   Procedure: EXCISION, EXOSTOSIS;  Surgeon: Janit Thresa HERO, DPM;  Location: [facility];  Service: Orthopedics/Podiatry;  Laterality: Right;   CARDIAC CATHETERIZATION  11/2008   Dr. Jeffrie - 20% calcified non flow limiting left main, 50% EF apical hypokinesis   CARPAL TUNNEL RELEASE Bilateral    CERVICAL DISC ARTHROPLASTY N/A 04/07/2017   Procedure: Cervical six-seven Disc arthroplasty;  Surgeon:  Colon Shove, MD;  Location: [facility] OR;  Service: Neurosurgery;  Laterality: N/A;   CHEST TUBE INSERTION Left 04/11/2017   Procedure: CHEST TUBE INSERTION;  Surgeon: Fleeta Hanford Coy, MD;  Location: [facility] OR;  Service: Thoracic;  Laterality: Left;   CHOLECYSTECTOMY     COLONOSCOPY  2016   CG-MAC-miralax (good)servere TICS/TA x 2   ESOPHAGEAL MANOMETRY N/A 05/30/2021   Procedure: ESOPHAGEAL MANOMETRY (EM);  Surgeon: San Sandor GAILS, DO;  Location: [facility];  Service: Gastroenterology;  Laterality: N/A;  ph impedence   ESOPHAGOGASTRODUODENOSCOPY     EYE SURGERY Bilateral 2011   steroidal encapsulation (04/28/2012)   FOOT SURGERY Right    x 2   JOINT REPLACEMENT     Many surgeries including 5 joit replacements   KNEE ARTHROSCOPY Bilateral    LATERAL / POSTERIOR COMBINED FUSION LUMBAR SPINE  2012   LEFT ATRIAL APPENDAGE OCCLUSION N/A 02/22/2021   Procedure: LEFT ATRIAL APPENDAGE OCCLUSION;  Surgeon: Cindie Ole DASEN, MD;  [facility]: [facility];  Service: Cardiovascular;  Laterality: N/A;   NEUROPLASTY / TRANSPOSITION ULNAR NERVE AT ELBOW Bilateral    POLYPECTOMY  2016   severe TICS/TA x 2   POSTERIOR CERVICAL LAMINECTOMY WITH MET- RX Right 06/11/2021   Procedure: Right Cervical six-seven  Laminectomy and foraminotomy with metrex;  Surgeon: Colon Shove, MD;  Location: [facility] OR;  Service: Neurosurgery;  Laterality: Right;   REFRACTIVE SURGERY Left 07/07/2023   laser surgery but still has floaters   REVERSE SHOULDER ARTHROPLASTY  04/28/2012   Procedure: REVERSE SHOULDER ARTHROPLASTY;  Surgeon: Eva Elsie Herring, MD;  Location: [facility] OR;  Service: Orthopedics;  Laterality: Left;  Left shouder reverse total shoulder arthroplasty   SHOULDER SURGERY     SPINE SURGERY     I have had 7 spinal surgeries   TEE WITHOUT CARDIOVERSION N/A 02/22/2021   Procedure: TRANSESOPHAGEAL ECHOCARDIOGRAM (TEE);  Surgeon: Cindie Ole DASEN, MD;  Location: [facility] INVASIVE CV LAB;  Service: Cardiovascular;  Laterality: N/A;   TENDON REPAIR Right 08/07/2020   Procedure: RIGHT HAND LIGAMENT RECONSTRUCTION AND TENDON INTERPOSITION;  Surgeon: Yvone Rush, MD;  Location: [facility];  Service: Orthopedics;  Laterality: Right;   TONSILLECTOMY AND ADENOIDECTOMY  1954   TOTAL HIP ARTHROPLASTY Left 2011   left (04/28/2012)   TOTAL HIP ARTHROPLASTY Right 07/31/2015   Procedure: TOTAL HIP ARTHROPLASTY ANTERIOR APPROACH;  Surgeon: Rush Yvone,  MD;  Location: [facility] OR;  Service: Orthopedics;  Laterality: Right;   TOTAL KNEE ARTHROPLASTY  01/10/2012   Procedure: TOTAL KNEE ARTHROPLASTY;  Surgeon: Rush LITTIE Yvone, MD;  Location: [facility] OR;  Service: Orthopedics;;  left total knee arthroplasty   TOTAL KNEE ARTHROPLASTY Right 06/17/2014   Procedure: TOTAL KNEE ARTHROPLASTY;  Surgeon: Rush LITTIE Yvone, MD;  Location: [facility] OR;  Service: Orthopedics;  Laterality: Right;   toupet fundolplication  11/01/2021   TRANSESOPHAGEAL ECHOCARDIOGRAM (CATH LAB) N/A 03/24/2023   Procedure: TRANSESOPHAGEAL ECHOCARDIOGRAM;  Surgeon: Mona Vinie BROCKS, MD;  Location: [facility];  Service: Cardiovascular;  Laterality: N/A;   Social History   Occupational History   Occupation: Retired    Comment: Nurse, adult from Engineer, production , 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

## 2024-01-16 ENCOUNTER — Other Ambulatory Visit: Payer: Self-pay

## 2024-01-16 LAB — BASIC METABOLIC PANEL WITH GFR
BUN/Creatinine Ratio: 24 (ref 10–24)
BUN: 18 mg/dL (ref 8–27)
CO2: 24 mmol/L (ref 20–29)
Calcium: 9.2 mg/dL (ref 8.6–10.2)
Chloride: 104 mmol/L (ref 96–106)
Creatinine, Ser: 0.76 mg/dL (ref 0.76–1.27)
Glucose: 89 mg/dL (ref 70–99)
Potassium: 3.3 mmol/L — ABNORMAL LOW (ref 3.5–5.2)
Sodium: 144 mmol/L (ref 134–144)
eGFR: 93 mL/min/1.73 (ref >59)

## 2024-01-16 NOTE — Patient Instructions (Signed)
 Visit Information  Thank you for taking time to visit with me today. Please don't hesitate to contact me if I can be of assistance to you before our next scheduled telephone appointment.  Our next appointment is by telephone on 01/23/24 at 9am  Following is a copy of your care plan:   Goals Addressed             This Visit's Progress    VBCI Transitions of Care (TOC) Care Plan       Problems:  Recent Hospitalization for treatment of Mild Cellulitis of residual R limb at BKA  Functional/Safety concern: High Risk of falls - Update 01/16/24 patient states he feels he is safer now after working with Home Health PT/OT who continue to come  Goal:  Over the next 30 days, the patient will not experience hospital readmission  Interventions:  Transitions of Care: Doctor Visits  - discussed the importance of doctor visits Arranged PCP follow-up within 7 days (Care Guide Scheduled) Reviewed Signs and symptoms of infection 01/07/24 Update: Patient not available at time of call/was working with home health PT at time of call and SN is coming later today - OT is coming another day - Wife completed call - wife states patient is adjusting to his life changes after June 20,2025 BKA and states he is doing well with transfers Reviewed need for blood work for K+ level repeat after K+ was stopped 01/01/24 for high K+ of 5.7 Update 01/16/24: Patient states he had potassium rechecked 01/15/24 noted to be 3.3 - secure chat message was sent in EPIC to PCP, Dr T about results - no response received prior to ending call with patient. Patient confirmed that he had 2 appointments yesterday 01/15/24 and states he is cleansing Left toes as instructed and states he cut out a shoe to allow more room at the toes. TOC RN noted that a Depression assessment was documented 01/15/24 which was different than when [place] RN assessed. Patient again states he is not depressed and only has a few moments on occasion for he feels a little down  but states he bounces right back and doesn't let it get to him and feels it's no different that anyone else. Patient denied any depression.  Patient confirmed Pregabalin  dose increased to 150mg  2x/day     Falls Interventions: Reviewed medications and discussed potential side effects of medications such as dizziness and frequent urination Advised patient of importance of notifying provider of falls Assessed for falls since last encounter Assessed patients knowledge of fall risk prevention secondary to previously provided education Assessed social determinant of health barriers  Patient Self Care Activities:  Attend all scheduled provider appointments Call pharmacy for medication refills 3-7 days in advance of running out of medications Call provider office for new concerns or questions  Notify RN Care Manager of TOC call rescheduling needs Participate in Transition of Care Program/Attend TOC scheduled calls Take medications as prescribed    Plan:  Patient has appointments scheduled 01/15/24 with Urbano Albright  & Dr Harden  - Completed - next f/u with Dr T is 01/27/24 Telephone follow up appointment with care management team member scheduled for:  01/23/24 9am The patient has been provided with contact information for the care management team and has been advised to call with any health related questions or concerns.         Patient verbalizes understanding of instructions and care plan provided today and agrees to view in MyChart. Active MyChart status  and patient understanding of how to access instructions and care plan via MyChart confirmed with patient.     The patient has been provided with contact information for the care management team and has been advised to call with any health related questions or concerns.   Please call the care guide team at [phone] if you need to cancel or reschedule your appointment.   Please call the Suicide and Crisis Lifeline: 988 call  1-800-273-TALK (toll free, 24 hour hotline) call 911 if you are experiencing a Mental Health or Behavioral Health Crisis or need someone to talk to.  Shona Prow RN, CCM [place]  VBCI-Population Health RN Care Manager [phone]

## 2024-01-16 NOTE — Transitions of Care (Post Inpatient/ED Visit) (Signed)
 Transition of Care week 3  Visit Note  01/16/2024  Name: Levi [name] MRN: [id number]          DOB: 1945-07-23  Situation: Patient enrolled in [facility] 30-day program. Visit completed with patient by telephone.   Background: Admit/Discharge Date 8/1 - 8/4  [name]   Primary Diagnosis: Mild Cellulitis of residual R limb at BKA site - amputation in November 14, 2023  Admit/Discharge Date: 6/14 - 6/22  [name]   Primary Diagnosis: Right Foot Infection - Sepsis  Initial Transition Care Management Follow-up Telephone Call    Past Medical History:  Diagnosis Date   Agent orange exposure 1970's   takes Imdur ,Diltiazem , and Atacand  daily   Agent orange exposure    ALLERGIC RHINITIS    Allergy     seasonal allergies   Anemia    iron  deficiency   Arthritis    Asthma    uses inhaler and nebulizers   Atrial fibrillation (HCC)    Bruises easily    d/t prednisone  daily   Cataract    Cellulitis 03/22/2020   Chest wall pain 09/21/2019   L scapula, reported 4/27 21/     CHF (congestive heart failure) (HCC)    dx-on meds to treat   Chronic bronchitis (HCC)    used to get it q yr; last time ?2008 (04/28/2012)   Clotting disorder (HCC) 2019   On blood thinner when I bleed it is for a consideral amount of time. This is tegardless of size of wound.   COPD (chronic obstructive pulmonary disease) (HCC)    agent orange exposure   Degenerative disk disease    everywhere (04/28/2012)   Depression due to physical illness 12/15/2023   Diverticulitis    Dysrhythmia    afib   Elevated uric acid in blood    takes Allopurinol  daily   Emphysema of lung (HCC)    Enlarged prostate    but not on any meds   GERD (gastroesophageal reflux disease)    takes Protonix  daily   H/O hiatal hernia    Headache    History of colonoscopy    History of pneumonia    a. 2010   Hyperlipidemia    takes Lipitor daily; pt. states he takes a preventive   Hypothyroidism    Joint pain    Joint swelling     Leukocytosis    MSSA bacteremia 03/19/2023   Nausea and vomiting 11/12/2022   Neuromuscular disorder (HCC)    bilat legs, and bilat arms   Neuropathy    Osteoporosis 2012   PAF (paroxysmal atrial fibrillation) (HCC)    a. Dx 04/2012, CHA2DS2VASc = 1 (age);  b. 04/2012 Echo: EF 40-50%, mild MR.   Personal history of colonic adenomas 02/09/2013   Pneumonia    Sepsis (HCC) 03/18/2023   Shortness of breath dyspnea    Thyroid  disease    no taking meds-caused dizziness    Assessment: Patient Reported Symptoms: Cognitive Cognitive Status: No symptoms reported, Normal speech and language skills, Alert and oriented to person, place, and time      Neurological Neurological Review of Symptoms: Other: [place] Neurological Symptoms/Conditions [RPT]: patient reports having neuropathy mostly in my hands Neurological Management Strategies: Medication therapy (Pregabalin  was increased to 150mg  on 01/15/24) Neurological Self-Management Outcome: 3 (uncertain)  HEENT HEENT Symptoms Reported: No symptoms reported      Cardiovascular Cardiovascular Symptoms Reported: Dizziness (patient reports occasional dizziness - states neurologist sent him for testing  - nothing found that  would be a cause)    Respiratory Respiratory Symptoms Reported: No symptoms reported Additional Respiratory Details: Patient reports finished antibiotic and denies issue during today's call    Endocrine Endocrine Symptoms Reported: Blurry vision (patient states when he has the dizziness he has vision changes - states he saw optometrist and said this is not related eyes and states he was offered Meclizine  for vertigo and didn't feel it helped)    Gastrointestinal Gastrointestinal Symptoms Reported: Reflux/heartburn Additional Gastrointestinal Details: patient states he had fundiplication at [facility] 2023 and feels that and the pantoprazole  of have helped the heart burn      Genitourinary Genitourinary Symptoms Reported:  Frequency Additional Genitourinary Details: patient reports having not taken the Lasix  that he only takes as needed but still urinates a lot    Integumentary Additional Integumentary Details: Patient reports 3-4 dots today and states this was assessed at  appointment 01/15/24 and states this is actually on the left and states the right BKA area had a remaining stitch was removed 01/15/24    Musculoskeletal Additional Musculoskeletal Details: patient states he has PT and OT coming to the home and feels he is getting around better than when he first got home from hospital        Psychosocial           There were no vitals filed for this visit.  Medications Reviewed Today     Reviewed by Lauro Shona LABOR, RN (Registered Nurse) on 01/16/24 at 1112  Med List Status: <None>   Medication Order Taking? Sig Documenting Provider Last Dose Status Informant  acetaminophen  (TYLENOL ) 325 MG tablet [id number] Yes Take 1-2 tablets (325-650 mg total) by mouth every 6 (six) hours as needed for mild pain (pain score 1-3) (or temp > 100.5). Maurice Sharlet RAMAN, PA-C  Active Self, Spouse/Significant Other  albuterol  (VENTOLIN  HFA) 108 (90 Base) MCG/ACT inhaler [id number] Yes INHALE 2 PUFFS BY MOUTH EVERY 4 HOURS AS NEEDED FOR WHEEZE OR FOR SHORTNESS OF BREATH Young, Clinton D, MD  Active Spouse/Significant Other, Self  apixaban  (ELIQUIS ) 5 MG TABS tablet [id number] Yes Take 1 tablet (5 mg total) by mouth 2 (two) times daily. Maurice Sharlet RAMAN, PA-C  Active Self, Spouse/Significant Other  atorvastatin  (LIPITOR) 20 MG tablet [id number] Yes Take 1 tablet (20 mg total) by mouth daily. Jeffrie Oneil BROCKS, MD  Active Self, Spouse/Significant Other  cyclobenzaprine  (FLEXERIL ) 10 MG tablet [id number] Yes TAKE 1 TABLET BY MOUTH THREE TIMES A DAY AS NEEDED FOR MUSCLE SPASMS Curtis Debby PARAS, MD  Active   diltiazem  (CARDIZEM  CD) 180 MG 24 hr capsule [id number] Yes Take 1 capsule (180 mg total) by mouth daily. Jeffrie Oneil BROCKS, MD   Active Spouse/Significant Other, Self  DULoxetine  (CYMBALTA ) 30 MG capsule [id number] Yes TAKE 1 CAPSULE BY MOUTH EVERY DAY Curtis Debby PARAS, MD  Active   feeding supplement (ENSURE PLUS HIGH PROTEIN) LIQD [id number]  Take 237 mLs by mouth 2 (two) times daily between meals.  Patient not taking: Reported on 12/30/2023   Raenelle Donalda HERO, MD  Active Self, Spouse/Significant Other  ferrous gluconate  (FERGON) 324 MG tablet [id number] Yes Take 1 tablet (324 mg total) by mouth daily. Maurice Sharlet RAMAN, PA-C  Active Self, Spouse/Significant Other  furosemide  (LASIX ) 40 MG tablet [id number] Yes Take 0.5 tablets (20 mg total) by mouth daily as needed for edema. Franchot Novel, MD  Active   hydrocerin [employer]) CREA [id number] Yes Apply 1 Application topically daily. To left foot Love, Sharlet RAMAN, PA-C  Active Self, Spouse/Significant Other  HYDROcodone -acetaminophen  (NORCO) 10-325 MG tablet [id number] Yes Take 1 tablet by mouth every 8 (eight) hours as needed. Curtis Debby PARAS, MD  Active   ipratropium-albuterol  (DUONEB) 0.5-2.5 (3) MG/3ML SOLN [id number] Yes 1 NEBULE EVERY 6 HOURS AS NEEDED. **J45.3** Neysa Rama D, MD  Active Spouse/Significant Other, Self  isosorbide  mononitrate (IMDUR ) 30 MG 24 hr tablet [id number] Yes Take 1 tablet (30 mg total) by mouth daily. Jeffrie Oneil BROCKS, MD  Active Self, Spouse/Significant Other  melatonin 5 MG TABS [id number] Yes Take 1 tablet (5 mg total) by mouth at bedtime as needed. Maurice Sharlet RAMAN, PA-C  Active Self, Spouse/Significant Other  Multiple Vitamin (MULTIVITAMIN WITH MINERALS) TABS tablet [id number] Yes Take 1 tablet by mouth daily. Maurice Sharlet RAMAN, PA-C  Active Self, Spouse/Significant Other  Olodaterol HCl (STRIVERDI RESPIMAT) 2.5 MCG/ACT AERS [id number] Yes INHALE 2 PUFFS BY MOUTH INTO THE LUNGS DAILY Neysa Rama BIRCH, MD  Active Spouse/Significant Other, Self  pantoprazole  (PROTONIX ) 40 MG tablet [id number] Yes Take 1 tablet (40 mg total) by mouth 2 (two) times  daily before a meal. Love, Pamela S, PA-C  Active Self, Spouse/Significant Other  polyethylene glycol (MIRALAX  / GLYCOLAX ) 17 g packet [id number]  Take 17 g by mouth daily.  Patient not taking: Reported on 01/16/2024   Raenelle Donalda HERO, MD  Active Self, Spouse/Significant Other  predniSONE  (DELTASONE ) 5 MG tablet [id number] Yes 3 tabs daily Neysa Rama D, MD  Active Self, Spouse/Significant Other  pregabalin  (LYRICA ) 150 MG capsule [id number] Yes Take 1 capsule (150 mg total) by mouth 2 (two) times daily. Urbano Albright, MD  Active   tamsulosin  (FLOMAX ) 0.4 MG CAPS capsule [id number] Yes Take 1 capsule (0.4 mg total) by mouth at bedtime. Maurice Sharlet RAMAN, PA-C  Active Self, Spouse/Significant Other            Recommendation:   Continue Current Plan of Care  Follow Up Plan:   Telephone follow up appointment date/time:  01/23/24 9am  Shona Prow RN, CCM [place]  [facility]Population Health RN Care Manager [phone]

## 2024-01-17 DIAGNOSIS — I480 Paroxysmal atrial fibrillation: Secondary | ICD-10-CM | POA: Diagnosis not present

## 2024-01-17 DIAGNOSIS — I110 Hypertensive heart disease with heart failure: Secondary | ICD-10-CM | POA: Diagnosis not present

## 2024-01-17 DIAGNOSIS — J440 Chronic obstructive pulmonary disease with acute lower respiratory infection: Secondary | ICD-10-CM | POA: Diagnosis not present

## 2024-01-17 DIAGNOSIS — I5042 Chronic combined systolic (congestive) and diastolic (congestive) heart failure: Secondary | ICD-10-CM | POA: Diagnosis not present

## 2024-01-17 DIAGNOSIS — J189 Pneumonia, unspecified organism: Secondary | ICD-10-CM | POA: Diagnosis not present

## 2024-01-17 DIAGNOSIS — J439 Emphysema, unspecified: Secondary | ICD-10-CM | POA: Diagnosis not present

## 2024-01-18 ENCOUNTER — Other Ambulatory Visit: Payer: Self-pay | Admitting: Internal Medicine

## 2024-01-19 DIAGNOSIS — I110 Hypertensive heart disease with heart failure: Secondary | ICD-10-CM | POA: Diagnosis not present

## 2024-01-19 DIAGNOSIS — J440 Chronic obstructive pulmonary disease with acute lower respiratory infection: Secondary | ICD-10-CM | POA: Diagnosis not present

## 2024-01-19 DIAGNOSIS — J189 Pneumonia, unspecified organism: Secondary | ICD-10-CM | POA: Diagnosis not present

## 2024-01-19 DIAGNOSIS — J439 Emphysema, unspecified: Secondary | ICD-10-CM | POA: Diagnosis not present

## 2024-01-19 DIAGNOSIS — I480 Paroxysmal atrial fibrillation: Secondary | ICD-10-CM | POA: Diagnosis not present

## 2024-01-19 DIAGNOSIS — I5042 Chronic combined systolic (congestive) and diastolic (congestive) heart failure: Secondary | ICD-10-CM | POA: Diagnosis not present

## 2024-01-19 NOTE — Telephone Encounter (Signed)
 OK refill albuterol  MDI.  Last OV 12/18/2023.  Per chart note: Return in about 6 months (around 06/19/2024).

## 2024-01-21 ENCOUNTER — Ambulatory Visit
Admission: EM | Admit: 2024-01-21 | Discharge: 2024-01-21 | Disposition: A | Discharge status: Home / Self Care | Attending: Family Medicine | Admitting: Family Medicine

## 2024-01-21 ENCOUNTER — Ambulatory Visit

## 2024-01-21 ENCOUNTER — Ambulatory Visit: Payer: Self-pay

## 2024-01-21 ENCOUNTER — Telehealth: Payer: Self-pay

## 2024-01-21 DIAGNOSIS — M25551 Pain in right hip: Secondary | ICD-10-CM

## 2024-01-21 DIAGNOSIS — Z96643 Presence of artificial hip joint, bilateral: Secondary | ICD-10-CM | POA: Diagnosis not present

## 2024-01-21 DIAGNOSIS — W19XXXA Unspecified fall, initial encounter: Secondary | ICD-10-CM

## 2024-01-21 DIAGNOSIS — I480 Paroxysmal atrial fibrillation: Secondary | ICD-10-CM | POA: Diagnosis not present

## 2024-01-21 DIAGNOSIS — J439 Emphysema, unspecified: Secondary | ICD-10-CM | POA: Diagnosis not present

## 2024-01-21 DIAGNOSIS — M8580 Other specified disorders of bone density and structure, unspecified site: Secondary | ICD-10-CM | POA: Diagnosis not present

## 2024-01-21 DIAGNOSIS — J440 Chronic obstructive pulmonary disease with acute lower respiratory infection: Secondary | ICD-10-CM | POA: Diagnosis not present

## 2024-01-21 DIAGNOSIS — Z471 Aftercare following joint replacement surgery: Secondary | ICD-10-CM | POA: Diagnosis not present

## 2024-01-21 DIAGNOSIS — I110 Hypertensive heart disease with heart failure: Secondary | ICD-10-CM | POA: Diagnosis not present

## 2024-01-21 DIAGNOSIS — I5042 Chronic combined systolic (congestive) and diastolic (congestive) heart failure: Secondary | ICD-10-CM | POA: Diagnosis not present

## 2024-01-21 DIAGNOSIS — J189 Pneumonia, unspecified organism: Secondary | ICD-10-CM | POA: Diagnosis not present

## 2024-01-21 MED ORDER — OXYCODONE-ACETAMINOPHEN 5-325 MG PO TABS: 1.0000 | ORAL_TABLET | Freq: Three times a day (TID) | ORAL | 0 refills | Status: AC | PRN

## 2024-01-21 NOTE — ED Provider Notes (Signed)
 [name]    CSN: [id number] Arrival date & time: 01/21/24  1424      History   Chief Complaint Chief Complaint  Patient presents with   Fall   Hip Pain    RT groin    HPI Levi [name] is a 78 y.o. male.   HPI Very pleasant 78 year old male presents with mild head injury, right hip pain and right groin pain secondary to fall yesterday at home.  Patient is accompanied by his wife this afternoon.  PMH significant for CHF, atrial fibrillation, and COPD.  Patient did sustain a minor skin tear/skin avulsion during fall.  Patient is currently on apixaban  and denies any unusual bleeding.  Past Medical History:  Diagnosis Date   Agent orange exposure 1970's   takes Imdur ,Diltiazem , and Atacand  daily   Agent orange exposure    ALLERGIC RHINITIS    Allergy     seasonal allergies   Anemia    iron  deficiency   Arthritis    Asthma    uses inhaler and nebulizers   Atrial fibrillation (HCC)    Bruises easily    d/t prednisone  daily   Cataract    Cellulitis 03/22/2020   Chest wall pain 09/21/2019   L scapula, reported 4/27 21/     CHF (congestive heart failure) (HCC)    dx-on meds to treat   Chronic bronchitis (HCC)    used to get it q yr; last time ?2008 (04/28/2012)   Clotting disorder (HCC) 2019   On blood thinner when I bleed it is for a consideral amount of time. This is tegardless of size of wound.   COPD (chronic obstructive pulmonary disease) (HCC)    agent orange exposure   Degenerative disk disease    everywhere (04/28/2012)   Depression due to physical illness 12/15/2023   Diverticulitis    Dysrhythmia    afib   Elevated uric acid in blood    takes Allopurinol  daily   Emphysema of lung (HCC)    Enlarged prostate    but not on any meds   GERD (gastroesophageal reflux disease)    takes Protonix  daily   H/O hiatal hernia    Headache    History of colonoscopy    History of pneumonia    a. 2010   Hyperlipidemia    takes Lipitor daily; pt.  states he takes a preventive   Hypothyroidism    Joint pain    Joint swelling    Leukocytosis    MSSA bacteremia 03/19/2023   Nausea and vomiting 11/12/2022   Neuromuscular disorder (HCC)    bilat legs, and bilat arms   Neuropathy    Osteoporosis 2012   PAF (paroxysmal atrial fibrillation) (HCC)    a. Dx 04/2012, CHA2DS2VASc = 1 (age);  b. 04/2012 Echo: EF 40-50%, mild MR.   Personal history of colonic adenomas 02/09/2013   Pneumonia    Sepsis (HCC) 03/18/2023   Shortness of breath dyspnea    Thyroid  disease    no taking meds-caused dizziness    Patient Active Problem List   Diagnosis Date Noted   Hyperkalemia 01/02/2024   CAP (community acquired pneumonia) 12/26/2023   Depression due to physical illness 12/15/2023   Hematochezia 07/25/2023   Closed fracture of ramus of right pubis (HCC) 04/07/2023   S/P PICC central line placement 03/31/2023   Chronic superficial venous thrombosis of both lower extremities 11/18/2022   Primary osteoarthritis, right shoulder 07/05/2022   Shingles 05/16/2022   Vertigo 04/04/2022  BPH (benign prostatic hyperplasia) 02/21/2022   Annual physical exam 01/22/2022   History of right below knee amputation (HCC) 01/22/2022   Atypical chest pain    Chronic anticoagulation 03/15/2021   Peripheral neuropathy 02/22/2021   Chronic systolic heart failure (HCC) 02/22/2021   Essential (primary) hypertension 11/15/2020   Arthritis of carpometacarpal (CMC) joint of right thumb 08/07/2020   MIld Cellulitis of residual R limb at BKA site 03/22/2020   COPD (chronic obstructive pulmonary disease) (HCC) 03/21/2020   GERD (gastroesophageal reflux disease) status post Nissen fundoplication 12/03/2017   Lumbar stenosis status post T12-S1 fusion 04/07/2017   History of total right hip arthroplasty 07/31/2015   Primary osteoarthritis of right knee 06/17/2014   Hyperlipidemia    History of colonic polyps 02/09/2013   Paroxysmal atrial fibrillation (HCC)  05/11/2012   Osteoarthritis of left knee 01/10/2012   Seasonal and perennial allergic rhinitis 07/23/2010   Asthma, moderate persistent 03/06/2009    Past Surgical History:  Procedure Laterality Date   46 HOUR PH STUDY N/A 05/30/2021   Procedure: 24 HOUR PH STUDY;  Surgeon: San Sandor GAILS, DO;  Location: [facility];  Service: Gastroenterology;  Laterality: N/A;   AMPUTATION Right 11/14/2023   Procedure: AMPUTATION BELOW KNEE;  Surgeon: Harden Jerona GAILS, MD;  Location: [facility] OR;  Service: Orthopedics;  Laterality: Right;   AMPUTATION TOE Bilateral 08/13/2023   Procedure: LEFT SECOND TOE AMPUTATION AND RIGHT FIFTH TOE AMPUTATION;  Surgeon: Malvin Marsa FALCON, DPM;  Location: [facility] OR;  Service: Orthopedics/Podiatry;  Laterality: Bilateral;   ANTERIOR CERVICAL DECOMP/DISCECTOMY FUSION N/A 07/21/2012   Procedure: ANTERIOR CERVICAL DECOMPRESSION/DISCECTOMY FUSION 2 LEVELS;  Surgeon: Victory Gens, MD;  Location: [facility];  Service: Neurosurgery;  Laterality: N/A;  C4-5 C5-6 Anterior cervical decompression/diskectomy/fusion   ANTERIOR LAT LUMBAR FUSION N/A 04/07/2017   Procedure: Thoracic twelve-Lumbar one Anterolateral decompression/fusion;  Surgeon: Gens Victory, MD;  Location: [facility];  Service: Neurosurgery;  Laterality: N/A;   ARTHRODESIS FOOT WITH WEIL OSTEOTOMY Left 11/07/2022   Procedure: LEFT THIRD METATARSOPHALANGEAL CAPSULOTOMY, WEIL OSTEOTOMY;  Surgeon: Elsa Lonni SAUNDERS, MD;  Location: [facility] OR;  Service: Orthopedics;  Laterality: Left;  LENGTH OF SURGERY: 60 MINUTES   BACK SURGERY     x 3   BELOW KNEE LEG AMPUTATION Right    June 2025   BONE EXOSTOSIS EXCISION Right 10/16/2023   Procedure: EXCISION, EXOSTOSIS;  Surgeon: Janit Thresa HERO, DPM;  Location: [facility];  Service: Orthopedics/Podiatry;  Laterality: Right;   CARDIAC CATHETERIZATION  11/2008   Dr. Jeffrie - 20% calcified non flow limiting left main, 50% EF apical hypokinesis   CARPAL TUNNEL RELEASE Bilateral    CERVICAL  DISC ARTHROPLASTY N/A 04/07/2017   Procedure: Cervical six-seven Disc arthroplasty;  Surgeon: Gens Victory, MD;  Location: [facility] OR;  Service: Neurosurgery;  Laterality: N/A;   CHEST TUBE INSERTION Left 04/11/2017   Procedure: CHEST TUBE INSERTION;  Surgeon: Fleeta Hanford Coy, MD;  Location: [facility] OR;  Service: Thoracic;  Laterality: Left;   CHOLECYSTECTOMY     COLONOSCOPY  2016   CG-MAC-miralax (good)servere TICS/TA x 2   ESOPHAGEAL MANOMETRY N/A 05/30/2021   Procedure: ESOPHAGEAL MANOMETRY (EM);  Surgeon: San Sandor GAILS, DO;  Location: [facility];  Service: Gastroenterology;  Laterality: N/A;  ph impedence   ESOPHAGOGASTRODUODENOSCOPY     EYE SURGERY Bilateral 2011   steroidal encapsulation (04/28/2012)   FOOT SURGERY Right    x 2   JOINT REPLACEMENT     Many surgeries including 5 joit replacements   KNEE  ARTHROSCOPY Bilateral    LATERAL / POSTERIOR COMBINED FUSION LUMBAR SPINE  2012   LEFT ATRIAL APPENDAGE OCCLUSION N/A 02/22/2021   Procedure: LEFT ATRIAL APPENDAGE OCCLUSION;  Surgeon: Cindie Ole DASEN, MD;  [facility]: [facility];  Service: Cardiovascular;  Laterality: N/A;   NEUROPLASTY / TRANSPOSITION ULNAR NERVE AT ELBOW Bilateral    POLYPECTOMY  2016   severe TICS/TA x 2   POSTERIOR CERVICAL LAMINECTOMY WITH MET- RX Right 06/11/2021   Procedure: Right Cervical six-seven  Laminectomy and foraminotomy with metrex;  Surgeon: Colon Shove, MD;  Location: [facility];  Service: Neurosurgery;  Laterality: Right;   REFRACTIVE SURGERY Left 07/07/2023   laser surgery but still has floaters   REVERSE SHOULDER ARTHROPLASTY  04/28/2012   Procedure: REVERSE SHOULDER ARTHROPLASTY;  Surgeon: Eva Elsie Herring, MD;  Location: [facility] OR;  Service: Orthopedics;  Laterality: Left;  Left shouder reverse total shoulder arthroplasty   SHOULDER SURGERY     SPINE SURGERY     I have had 7 spinal surgeries   TEE WITHOUT CARDIOVERSION N/A 02/22/2021   Procedure: TRANSESOPHAGEAL ECHOCARDIOGRAM  (TEE);  Surgeon: Cindie Ole DASEN, MD;  Location: [facility] INVAS[facility] CV LAB;  Service: Cardiovascular;  Laterality: N/A;   TENDON REPAIR Right 08/07/2020   Procedure: RIGHT HAND LIGAMENT RECONSTRUCTION AND TENDON INTERPOSITION;  Surgeon: Yvone Rush, MD;  Location: [facility];  Service: Orthopedics;  Laterality: Right;   TONSILLECTOMY AND ADENOIDECTOMY  1954   TOTAL HIP ARTHROPLASTY Left 2011   left (04/28/2012)   TOTAL HIP ARTHROPLASTY Right 07/31/2015   Procedure: TOTAL HIP ARTHROPLASTY ANTERIOR APPROACH;  Surgeon: Rush Yvone, MD;  Location: [facility] OR;  Service: Orthopedics;  Laterality: Right;   TOTAL KNEE ARTHROPLASTY  01/10/2012   Procedure: TOTAL KNEE ARTHROPLASTY;  Surgeon: Rush LITTIE Yvone, MD;  Location: [facility] OR;  Service: Orthopedics;;  left total knee arthroplasty   TOTAL KNEE ARTHROPLASTY Right 06/17/2014   Procedure: TOTAL KNEE ARTHROPLASTY;  Surgeon: Rush LITTIE Yvone, MD;  Location: [facility];  Service: Orthopedics;  Laterality: Right;   toupet fundolplication  11/01/2021   TRANSESOPHAGEAL ECHOCARDIOGRAM (CATH LAB) N/A 03/24/2023   Procedure: TRANSESOPHAGEAL ECHOCARDIOGRAM;  Surgeon: Mona Vinie BROCKS, MD;  Location: [facility];  Service: Cardiovascular;  Laterality: N/A;       Home Medications    Prior to Admission medications   Medication Sig Start Date End Date Taking? Authorizing Provider  oxyCODONE -acetaminophen  (PERCOCET/ROXICET) 5-325 MG tablet Take 1 tablet by mouth every 8 (eight) hours as needed for up to 5 days for severe pain (pain score 7-10). 01/21/24 01/26/24 Yes Teddy Sharper, FNP  acetaminophen  (TYLENOL ) 325 MG tablet Take 1-2 tablets (325-650 mg total) by mouth every 6 (six) hours as needed for mild pain (pain score 1-3) (or temp > 100.5). 12/05/23   Love, Sharlet RAMAN, PA-C  albuterol  (VENTOLIN  HFA) 108 (90 Base) MCG/ACT inhaler INHALE 2 PUFFS BY MOUTH EVERY 4 HOURS AS NEEDED FOR WHEEZE OR FOR SHORTNESS OF BREATH 01/19/24   Neysa Rama D, MD  apixaban   (ELIQUIS ) 5 MG TABS tablet Take 1 tablet (5 mg total) by mouth 2 (two) times daily. 12/05/23   Love, Sharlet RAMAN, PA-C  atorvastatin  (LIPITOR) 20 MG tablet Take 1 tablet (20 mg total) by mouth daily. 12/03/23   Jeffrie Oneil BROCKS, MD  cyclobenzaprine  (FLEXERIL ) 10 MG tablet TAKE 1 TABLET BY MOUTH THREE TIMES A DAY AS NEEDED FOR MUSCLE SPASMS 12/31/23   Curtis Debby PARAS, MD  diltiazem  (CARDIZEM  CD) 180 MG 24 hr  capsule Take 1 capsule (180 mg total) by mouth daily. 08/25/23   Jeffrie Oneil BROCKS, MD  DULoxetine  (CYMBALTA ) 30 MG capsule TAKE 1 CAPSULE BY MOUTH EVERY DAY 01/06/24   Curtis Debby PARAS, MD  feeding supplement (ENSURE PLUS HIGH PROTEIN) LIQD Take 237 mLs by mouth 2 (two) times daily between meals. Patient not taking: Reported on 12/30/2023 11/16/23   Raenelle Donalda HERO, MD  ferrous gluconate  (FERGON) 324 MG tablet Take 1 tablet (324 mg total) by mouth daily. 12/05/23   Love, Sharlet RAMAN, PA-C  furosemide  (LASIX ) 40 MG tablet Take 0.5 tablets (20 mg total) by mouth daily as needed for edema. 12/29/23   Franchot Novel, MD  hydrocerin (EUCERIN) CREA Apply 1 Application topically daily. To left foot 12/05/23   Love, Sharlet RAMAN, PA-C  ipratropium-albuterol  (DUONEB) 0.5-2.5 (3) MG/3ML SOLN 1 NEBULE EVERY 6 HOURS AS NEEDED. **J45.3** 03/22/20   Neysa Rama D, MD  isosorbide  mononitrate (IMDUR ) 30 MG 24 hr tablet Take 1 tablet (30 mg total) by mouth daily. 12/18/23   Jeffrie Oneil BROCKS, MD  melatonin 5 MG TABS Take 1 tablet (5 mg total) by mouth at bedtime as needed. 12/05/23   Love, Sharlet RAMAN, PA-C  Multiple Vitamin (MULTIVITAMIN WITH MINERALS) TABS tablet Take 1 tablet by mouth daily. 12/05/23   Love, Sharlet RAMAN, PA-C  Olodaterol HCl (STRIVERDI RESPIMAT) 2.5 MCG/ACT AERS INHALE 2 PUFFS BY MOUTH INTO THE LUNGS DAILY 03/10/23   Neysa Rama D, MD  pantoprazole  (PROTONIX ) 40 MG tablet Take 1 tablet (40 mg total) by mouth 2 (two) times daily before a meal. 12/05/23   Love, Sharlet RAMAN, PA-C  predniSONE  (DELTASONE ) 5 MG tablet  3 tabs daily 12/25/23   Neysa Rama D, MD  pregabalin  (LYRICA ) 150 MG capsule Take 1 capsule (150 mg total) by mouth 2 (two) times daily. 01/15/24   Urbano Albright, MD  tamsulosin  (FLOMAX ) 0.4 MG CAPS capsule Take 1 capsule (0.4 mg total) by mouth at bedtime. 12/05/23   Love, Sharlet RAMAN, PA-C    Family History Family History  Problem Relation Age of Onset   Diabetes Mother    Heart disease Father        Died 31, [place]   Cancer Father    Hyperlipidemia Father    Stroke Father    Prostate cancer Brother    Colon polyps Brother    Diabetes Brother    Colon polyps Brother    Asthma Brother    COPD Brother    Colon cancer Brother        Dx age 26   Cancer Brother    Colon cancer Other    Esophageal cancer Neg Hx    Rectal cancer Neg Hx    Stomach cancer Neg Hx    Pancreatic cancer Neg Hx    Kidney disease Neg Hx    Liver disease Neg Hx    Neuropathy Neg Hx     Social History Social History   Tobacco Use   Smoking status: Never   Smokeless tobacco: Never  Vaping Use   Vaping status: Never Used  Substance Use Topics   Alcohol use: No   Drug use: No     Allergies   Levaquin  [levofloxacin ]   Review of Systems Review of Systems  Musculoskeletal:        Right hip/right groin pain secondary to fall yesterday at home     Physical Exam Triage Vital Signs ED Triage Vitals  Encounter Vitals Group  BP      Girls Systolic BP Percentile      Girls Diastolic BP Percentile      Boys Systolic BP Percentile      Boys Diastolic BP Percentile      Pulse      Resp      Temp      Temp src      SpO2      Weight      Height      Head Circumference      Peak Flow      Pain Score      Pain Loc      Pain Education      Exclude from Growth Chart    No data found.  Updated Vital Signs BP 107/71 (BP Location: Left Arm)   Pulse 94   Temp 98.1 F (36.7 C) (Oral)   Resp 18   SpO2 93%    Physical Exam Vitals and nursing note reviewed.  Constitutional:       Appearance: Normal appearance. He is normal weight. He is ill-appearing.  HENT:     Head: Normocephalic and atraumatic.     Mouth/Throat:     Mouth: Mucous membranes are moist.     Pharynx: Oropharynx is clear.  Eyes:     Extraocular Movements: Extraocular movements intact.     Pupils: Pupils are equal, round, and reactive to light.  Cardiovascular:     Rate and Rhythm: Normal rate and regular rhythm.     Pulses: Normal pulses.     Heart sounds: Normal heart sounds.  Pulmonary:     Effort: Pulmonary effort is normal.     Breath sounds: Normal breath sounds. No wheezing, rhonchi or rales.  Musculoskeletal:     Comments: Right hip: Moderately TTP, exam limited due to pain today  Skin:    General: Skin is warm and dry.  Neurological:     General: No focal deficit present.     Mental Status: He is alert and oriented to person, place, and time. Mental status is at baseline.  Psychiatric:        Mood and Affect: Mood normal.        Behavior: Behavior normal.     Comments: Patient seated comfortably in wheelchair during exam today.      UC Treatments / Results  Labs (all labs ordered are listed, but only abnormal results are displayed) Labs Reviewed - No data to display  EKG   Radiology DG Hip Unilat W or Wo Pelvis 2-3 Views Right Result Date: 01/21/2024 CLINICAL DATA:  Right hip pain after fall yesterday. EXAM: DG HIP (WITH OR WITHOUT PELVIS) 2-3V RIGHT COMPARISON:  December 26, 2023. FINDINGS: Status post bilateral total hip arthroplasties. Diffuse osteopenia is noted. Vascular calcifications are noted. Old right pubic rami fractures are noted. No acute fracture or dislocation is noted. IMPRESSION: No acute abnormality seen. Electronically Signed   By: Lynwood Landy Raddle M.D.   On: 01/21/2024 15:37    Procedures Procedures (including critical care time)  Medications Ordered in UC Medications - No data to display  Initial Impression / Assessment and Plan / UC Course  I have  reviewed the triage vital signs and the nursing notes.  Pertinent labs & imaging results that were available during my care of the patient were reviewed by me and considered in my medical decision making (see chart for details).     MDM: 1.  Right hip pain-right hip x-ray  results revealed above patient/wife advised, Rx'd Percocet 5/325 mg tablet: Take 1 tablet every 8 hours, as needed for severe right hip pain. Advised patient/wife of right hip x-ray results with hardcopy provided.  Advised patient may take Percocet daily or as needed for right hip pain.  Patient advised of sedative effects of this medication.  Encouraged increase daily water  intake to 64 ounces per day while taking this medication.  2.  Fall, initial-patient/wife report fall out of wheelchair accidentally at home yesterday.  Advised if symptoms worsen and/or unresolved please follow-up with your PCP or here for further evaluation.  Patient discharged home, hemodynamically stable.  Final Clinical Impressions(s) / UC Diagnoses   Final diagnoses:  Right hip pain  Fall, initial encounter     Discharge Instructions      Advised patient/wife of right hip x-ray results with hardcopy provided.  Advised patient may take Percocet daily or as needed for right hip pain.  Patient advised of sedative effects of this medication.  Encouraged increase daily water  intake to 64 ounces per day while taking this medication.  Advised if symptoms worsen and/or unresolved please follow-up with your PCP or here for further evaluation.     ED Prescriptions     Medication Sig Dispense Auth. Provider   oxyCODONE -acetaminophen  (PERCOCET/ROXICET) 5-325 MG tablet Take 1 tablet by mouth every 8 (eight) hours as needed for up to 5 days for severe pain (pain score 7-10). 15 tablet Samadhi Mahurin, FNP      I have reviewed the PDMP during this encounter.   Teddy Sharper, FNP 01/21/24 [phone]

## 2024-01-21 NOTE — ED Triage Notes (Signed)
 Pt here today c/o fall at home yesterday . Say he was dizzy and bent forward too far falling on head and rolled over to RT hip, hurting groin.

## 2024-01-21 NOTE — Telephone Encounter (Signed)
 FYI Only or Action Required?: FYI only for provider.  Patient was last seen in primary care on 01/01/2024 by Curtis Debby PARAS, MD.  Called Nurse Triage reporting Dizziness.  Symptoms began yesterday.  Interventions attempted: Other: unknown.  Symptoms are: stable.  Triage Disposition: See HCP Within 4 Hours (Or PCP Triage)  Patient/caregiver understands and will follow disposition?: Yes                 Reason for Disposition  [1] Dizziness caused by heat exposure, sudden standing, or poor fluid intake AND [2] no improvement after 2 hours of rest and fluids  Answer Assessment - Initial Assessment Questions Pt already on the way to UC when call was returned to this RN. Pt states he had a fall yesterday after feeling dizzy when getting out of his wheelchair. He states he has a skin tear on L hand, R elbow pain and has groin pain.  Outside of UC currently and states his pain is 6/7 out of 10. Pt urged to go into UC and be seen for symptoms due to fall. Pt agreed. Unable to assess patient further.  Protocols used: Dizziness - Lightheadedness-A-AH

## 2024-01-21 NOTE — Telephone Encounter (Signed)
 Forwarding message to Dr. Alvan covering Dr. Curtis

## 2024-01-21 NOTE — Discharge Instructions (Addendum)
 Advised patient/wife of right hip x-ray results with hardcopy provided.  Advised patient may take Percocet daily or as needed for right hip pain.  Patient advised of sedative effects of this medication.  Encouraged increase daily water  intake to 64 ounces per day while taking this medication.  Advised if symptoms worsen and/or unresolved please follow-up with your PCP or here for further evaluation.

## 2024-01-21 NOTE — Telephone Encounter (Signed)
 Copied from CRM [id number]. Topic: Clinical - Home Health Verbal Orders >> Jan 21, 2024  1:49 PM Alfonso ORN wrote: Caller/Agency: Charlena Bass/[name] Callback Number: [phone] Service Requested: NA Frequency: NA  Any new concerns about the patient? Yes   felt dizzy not sure or  passed out fell out of his wheelchair yesterday  01/20/24 skin tear on left hand, right elbow pain  dizzy , groin pain

## 2024-01-21 NOTE — Telephone Encounter (Signed)
 Hi Kim, I would just call to check on him tomorrow and make sure that he is feeling better and does not need to be seen.  Maybe have him check his blood pressure since he was dizzy to make sure it is not too low.

## 2024-01-21 NOTE — Telephone Encounter (Signed)
 PT Assist Tom with [name] called  states that pt states he got dizzy and but doesn't know if he passed out or not but he did fall out of his wheelchair. Per PT assistant, pt has Skin tear left hand abrasion on right elbow, and right groin pain. NT tried to contact the patient, no answer, left vm to return call to the office.    Copied from CRM [id number]. Topic: Clinical - Red Word Triage >> Jan 21, 2024  1:54 PM Alfonso ORN wrote: Red Word that prompted transfer to Nurse Triage: patient   felt dizzy not sure if  passed out fell out of his wheelchair yesterday  01/20/24 skin tear on left hand, right elbow pain  dizzy , groin pain

## 2024-01-22 ENCOUNTER — Telehealth: Payer: Self-pay

## 2024-01-22 DIAGNOSIS — J189 Pneumonia, unspecified organism: Secondary | ICD-10-CM | POA: Diagnosis not present

## 2024-01-22 DIAGNOSIS — J440 Chronic obstructive pulmonary disease with acute lower respiratory infection: Secondary | ICD-10-CM | POA: Diagnosis not present

## 2024-01-22 DIAGNOSIS — I480 Paroxysmal atrial fibrillation: Secondary | ICD-10-CM | POA: Diagnosis not present

## 2024-01-22 NOTE — Telephone Encounter (Signed)
 Spoke with patient. States that  his BP Is always good. Yesterday was 132/78. His dizziness has been coming and going for a couple of years .

## 2024-01-22 NOTE — Telephone Encounter (Signed)
 Called patient per call back orders pt reports he is feeling somewhat better.

## 2024-01-22 NOTE — Telephone Encounter (Signed)
 Attempted call to patient to ask him to check his BP - left a voice mail message requesting a return call.

## 2024-01-23 ENCOUNTER — Other Ambulatory Visit: Payer: Self-pay

## 2024-01-23 DIAGNOSIS — I110 Hypertensive heart disease with heart failure: Secondary | ICD-10-CM | POA: Diagnosis not present

## 2024-01-23 DIAGNOSIS — J439 Emphysema, unspecified: Secondary | ICD-10-CM | POA: Diagnosis not present

## 2024-01-23 DIAGNOSIS — J440 Chronic obstructive pulmonary disease with acute lower respiratory infection: Secondary | ICD-10-CM | POA: Diagnosis not present

## 2024-01-23 DIAGNOSIS — I480 Paroxysmal atrial fibrillation: Secondary | ICD-10-CM | POA: Diagnosis not present

## 2024-01-23 DIAGNOSIS — J189 Pneumonia, unspecified organism: Secondary | ICD-10-CM | POA: Diagnosis not present

## 2024-01-23 DIAGNOSIS — I5042 Chronic combined systolic (congestive) and diastolic (congestive) heart failure: Secondary | ICD-10-CM | POA: Diagnosis not present

## 2024-01-23 NOTE — Patient Instructions (Signed)
 Visit Information  Thank you for taking time to visit with me today. Please don't hesitate to contact me if I can be of assistance to you before our next scheduled telephone appointment.  Our next appointment is by telephone on 01/30/24 at 9am  Following is a copy of your care plan:   Goals Addressed             This Visit's Progress    VBCI Transitions of Care (TOC) Care Plan       Problems:  Recent Hospitalization for treatment of Mild Cellulitis of residual R limb at BKA  Functional/Safety concern: High Risk of falls - Update 01/16/24 patient states he feels he is safer now after working with Home Health PT/OT who continue to come  Goal:  Over the next 30 days, the patient will not experience hospital readmission  Interventions:  Transitions of Care: Doctor Visits  - discussed the importance of doctor visits Arranged PCP follow-up within 7 days (Care Guide Scheduled) Reviewed Signs and symptoms of infection 01/07/24 Update: Patient not available at time of call/was working with home health PT at time of call and SN is coming later today - OT is coming another day - Wife completed call - wife states patient is adjusting to his life changes after June 20,2025 BKA and states he is doing well with transfers Reviewed need for blood work for K+ level repeat after K+ was stopped 01/01/24 for high K+ of 5.7 Update 01/16/24: Patient states he had potassium rechecked 01/15/24 noted to be 3.3 - secure chat message was sent in EPIC to PCP, Dr T about results - no response received prior to ending call with patient. Patient confirmed that he had 2 appointments yesterday 01/15/24 and states he is cleansing Left toes as instructed and states he cut out a shoe to allow more room at the toes. TOC RN noted that a Depression assessment was documented 01/15/24 which was different than when [facility] RN assessed. Patient again states he is not depressed and only has a few moments on occasion for he feels a little down  but states he bounces right back and doesn't let it get to him and feels it's no different that anyone else. Patient denied any depression.  Patient confirmed Pregabalin  dose increased to 150mg  2x/day  Update 01/23/24: Patient had been seen in UC 01/21/24 after fall at home - wife states patient doing well - a little sore but not needing the prescription pain medication. Note reflects Encouraged increase daily water  intake to 64 ounces per day while taking this medication - reinforced to wife. Also reinforced calling MD regarding Eliquis  and falls - Wife states patient's PCP is no longer with the practice as of 01/19/24 and states she plans to find a new PCP and denied need for assistance.     Falls Interventions: Reviewed medications and discussed potential side effects of medications such as dizziness and frequent urination Advised patient of importance of notifying provider of falls Assessed for falls since last encounter Assessed patients knowledge of fall risk prevention secondary to previously provided education Assessed social determinant of health barriers  Patient Self Care Activities:  Attend all scheduled provider appointments Call pharmacy for medication refills 3-7 days in advance of running out of medications Call provider office for new concerns or questions  Notify RN Care Manager of TOC call rescheduling needs Participate in Transition of Care Program/Attend TOC scheduled calls Take medications as prescribed    Plan:  Telephone follow up  appointment with care management team member scheduled for: 01/30/24 9am The patient has been provided with contact information for the care management team and has been advised to call with any health related questions or concerns.         Patient verbalizes understanding of instructions and care plan provided today and agrees to view in MyChart. Active MyChart status and patient understanding of how to access instructions and care plan  via MyChart confirmed with patient.     Telephone follow up appointment with care management team member scheduled for: 01/30/24 9am The patient has been provided with contact information for the care management team and has been advised to call with any health related questions or concerns.   Please call the care guide team at [phone] if you need to cancel or reschedule your appointment.   Please call the Suicide and Crisis Lifeline: 988 call 1-800-273-TALK (toll free, 24 hour hotline) call 911 if you are experiencing a Mental Health or Behavioral Health Crisis or need someone to talk to.  Shona Prow RN, CCM [place]  VBCI-Population Health RN Care Manager [phone]

## 2024-01-23 NOTE — Transitions of Care (Post Inpatient/ED Visit) (Signed)
 Transition of Care week 4  Visit Note  01/23/2024  Name: Levi [name] MRN: [id number]          DOB: Jan 17, 1946  Situation: Patient enrolled in [facility] 30-day program. Visit completed with wife by telephone.   Background: Admit/Discharge Date:  8/1 - 8/4  [name]   Primary Diagnosis: Mild Cellulitis of residual R limb at BKA site - amputation in November 14, 2023  Admit/Discharge Date:  6/14 - 6/22  [name]   Primary Diagnosis: Right Foot Infection - Sepsis  Initial Transition Care Management Follow-up Telephone Call    Past Medical History:  Diagnosis Date   Agent orange exposure 1970's   takes Imdur ,Diltiazem , and Atacand  daily   Agent orange exposure    ALLERGIC RHINITIS    Allergy     seasonal allergies   Anemia    iron  deficiency   Arthritis    Asthma    uses inhaler and nebulizers   Atrial fibrillation (HCC)    Bruises easily    d/t prednisone  daily   Cataract    Cellulitis 03/22/2020   Chest wall pain 09/21/2019   L scapula, reported 4/27 21/     CHF (congestive heart failure) (HCC)    dx-on meds to treat   Chronic bronchitis (HCC)    used to get it q yr; last time ?2008 (04/28/2012)   Clotting disorder (HCC) 2019   On blood thinner when I bleed it is for a consideral amount of time. This is tegardless of size of wound.   COPD (chronic obstructive pulmonary disease) (HCC)    agent orange exposure   Degenerative disk disease    everywhere (04/28/2012)   Depression due to physical illness 12/15/2023   Diverticulitis    Dysrhythmia    afib   Elevated uric acid in blood    takes Allopurinol  daily   Emphysema of lung (HCC)    Enlarged prostate    but not on any meds   GERD (gastroesophageal reflux disease)    takes Protonix  daily   H/O hiatal hernia    Headache    History of colonoscopy    History of pneumonia    a. 2010   Hyperlipidemia    takes Lipitor daily; pt. states he takes a preventive   Hypothyroidism    Joint pain    Joint swelling     Leukocytosis    MSSA bacteremia 03/19/2023   Nausea and vomiting 11/12/2022   Neuromuscular disorder (HCC)    bilat legs, and bilat arms   Neuropathy    Osteoporosis 2012   PAF (paroxysmal atrial fibrillation) (HCC)    a. Dx 04/2012, CHA2DS2VASc = 1 (age);  b. 04/2012 Echo: EF 40-50%, mild MR.   Personal history of colonic adenomas 02/09/2013   Pneumonia    Sepsis (HCC) 03/18/2023   Shortness of breath dyspnea    Thyroid  disease    no taking meds-caused dizziness    Assessment: Patient Reported Symptoms: Cognitive Cognitive Status: Unable to Assess (patient was not available for call - spoke with wife)      Neurological Neurological Review of Symptoms: Other: [place] Neurological Symptoms/Conditions [RPT]: patient with neuropathy - wife confirmed continues with pregabalin  increased dose    HEENT HEENT Symptoms Reported: Not assessed      Cardiovascular Cardiovascular Symptoms Reported: Other:, Swelling in legs or feet, Dizziness Other Cardiovascular Symptoms: patient was seen in urgent care 01/21/24 after a fall. wife states he fell out of wheelchair - paitent has dealt  with dizziness off and on for years - reviewed with wife who states swelling in legs has improved - patient not available at time of call    Respiratory Respiratory Symptoms Reported: No symptoms reported Other Respiratory Symptoms: Wife reports patient is not having breathing issues - is currently at an appointment and not available to talk - wife states patient is using maintenance inhaler as directed and not currently needing albuterol  inhaler or nebulizer but takes as needed Respiratory Management Strategies: Medication therapy Respiratory Self-Management Outcome: 4 (good)  Endocrine Endocrine Symptoms Reported: Not assessed (Patient not available for call - at an appointment - spoke with wife)    Gastrointestinal Gastrointestinal Symptoms Reported: Not assessed Additional Gastrointestinal Details: patient not  available at time of call      Genitourinary Genitourinary Symptoms Reported: Frequency Additional Genitourinary Details: wife reports patient continues to urinate frequently - discussed patient also takes Flomax  - wife stataes he hasn't needed the Lasix     Integumentary Integumentary Symptoms Reported: Not assessed Additional Integumentary Details: patient unavailble - will discuss in future call    Musculoskeletal Musculoskelatal Symptoms Reviewed: Other Other Musculoskeletal Symptoms: Patient was seen in urgent care 01/21/24 after fall - note reflectsmild head injury, right hip pain and right groin pain -Patient did sustain a minor skin tear/skin avulsion during fall - discussed notifying MD with any falls related to Eliquis  - no bleeding reported Additional Musculoskeletal Details: Note relfects: Encouraged increase daily water  intake to 64 ounces per day while taking this medication - TOC RN reinforced Musculoskeletal Management Strategies:  (patient is in wheelchair and wife reports he fell out of the wheelchair - [facility] RN will discuss further with patient on next call) Musculoskeletal Self-Management Outcome: 4 (good) Falls in the past year?: Yes Number of falls in past year: 2 or more Was there an injury with Fall?: Yes Fall Risk Category Calculator: 3 Patient Fall Risk Level: High Fall Risk Patient at Risk for Falls Due to: History of fall(s), Impaired mobility  Psychosocial Psychosocial Symptoms Reported: Other Other Psychosocial Conditions: patient not available for call         There were no vitals filed for this visit.  Medications Reviewed Today     Reviewed by Lauro Shona LABOR, RN (Registered Nurse) on 01/23/24 at [phone]  Med List Status: <None>   Medication Order Taking? Sig Documenting Provider Last Dose Status Informant  acetaminophen  (TYLENOL ) 325 MG tablet [id number] Yes Take 1-2 tablets (325-650 mg total) by mouth every 6 (six) hours as needed for mild pain (pain  score 1-3) (or temp > 100.5). Maurice Sharlet RAMAN, PA-C  Active Self, Spouse/Significant Other  albuterol  (VENTOLIN  HFA) 108 (90 Base) MCG/ACT inhaler [id number] Yes INHALE 2 PUFFS BY MOUTH EVERY 4 HOURS AS NEEDED FOR WHEEZE OR FOR SHORTNESS OF BREATH Young, Clinton D, MD  Active   apixaban  (ELIQUIS ) 5 MG TABS tablet [id number] Yes Take 1 tablet (5 mg total) by mouth 2 (two) times daily. Maurice Sharlet RAMAN, PA-C  Active Self, Spouse/Significant Other  atorvastatin  (LIPITOR) 20 MG tablet [id number] Yes Take 1 tablet (20 mg total) by mouth daily. Jeffrie Oneil BROCKS, MD  Active Self, Spouse/Significant Other  cyclobenzaprine  (FLEXERIL ) 10 MG tablet [id number] Yes TAKE 1 TABLET BY MOUTH THREE TIMES A DAY AS NEEDED FOR MUSCLE SPASMS Curtis Debby PARAS, MD  Active   diltiazem  (CARDIZEM  CD) 180 MG 24 hr capsule [id number] Yes Take 1 capsule (180 mg total) by mouth daily. Jeffrie Oneil BROCKS, MD  Active Spouse/Significant Other, Self  DULoxetine  (CYMBALTA ) 30 MG capsule [id number] Yes TAKE 1 CAPSULE BY MOUTH EVERY DAY Curtis Debby PARAS, MD  Active   feeding supplement (ENSURE PLUS HIGH PROTEIN) LIQD [id number]  Take 237 mLs by mouth 2 (two) times daily between meals.  Patient not taking: Reported on 12/30/2023   Raenelle Donalda HERO, MD  Active Self, Spouse/Significant Other  ferrous gluconate  (FERGON) 324 MG tablet [id number] Yes Take 1 tablet (324 mg total) by mouth daily. Maurice Sharlet RAMAN, PA-C  Active Self, Spouse/Significant Other  furosemide  (LASIX ) 40 MG tablet [id number] Yes Take 0.5 tablets (20 mg total) by mouth daily as needed for edema. Franchot Novel, MD  Active   hydrocerin [facility]) CREA [id number] Yes Apply 1 Application topically daily. To left foot  Patient taking differently: Apply 1 Application topically daily. To left foot  - Wife reports patient switched Nivea   Love, Pamela S, PA-C  Active Self, Spouse/Significant Other  ipratropium-albuterol  (DUONEB) 0.5-2.5 (3) MG/3ML SOLN [id number] Yes 1 NEBULE EVERY 6  HOURS AS NEEDED. **J45.3** Neysa Rama D, MD  Active Spouse/Significant Other, Self  isosorbide  mononitrate (IMDUR ) 30 MG 24 hr tablet [id number] Yes Take 1 tablet (30 mg total) by mouth daily. Jeffrie Oneil BROCKS, MD  Active Self, Spouse/Significant Other  melatonin 5 MG TABS [id number] Yes Take 1 tablet (5 mg total) by mouth at bedtime as needed. Maurice Sharlet RAMAN, PA-C  Active Self, Spouse/Significant Other  Multiple Vitamin (MULTIVITAMIN WITH MINERALS) TABS tablet [id number] Yes Take 1 tablet by mouth daily. Maurice Sharlet RAMAN, PA-C  Active Self, Spouse/Significant Other  Olodaterol HCl (STRIVERDI RESPIMAT) 2.5 MCG/ACT AERS [id number] Yes INHALE 2 PUFFS BY MOUTH INTO THE LUNGS DAILY Young, Rama BIRCH, MD  Active Spouse/Significant Other, Self  oxyCODONE -acetaminophen  (PERCOCET/ROXICET) 5-325 MG tablet [id number] Yes Take 1 tablet by mouth every 8 (eight) hours as needed for up to 5 days for severe pain (pain score 7-10). Ragan, Michael, FNP  Active   pantoprazole  (PROTONIX ) 40 MG tablet [id number] Yes Take 1 tablet (40 mg total) by mouth 2 (two) times daily before a meal. Love, Pamela S, PA-C  Active Self, Spouse/Significant Other  predniSONE  (DELTASONE ) 5 MG tablet [id number] Yes 3 tabs daily Neysa Rama D, MD  Active Self, Spouse/Significant Other  pregabalin  (LYRICA ) 150 MG capsule [id number] Yes Take 1 capsule (150 mg total) by mouth 2 (two) times daily. Urbano Albright, MD  Active   tamsulosin  (FLOMAX ) 0.4 MG CAPS capsule [id number] Yes Take 1 capsule (0.4 mg total) by mouth at bedtime. Maurice Sharlet RAMAN, PA-C  Active Self, Spouse/Significant Other            Recommendation:   Continue Current Plan of Care - wife is going to find new PCP  Follow Up Plan:   Telephone follow up appointment date/time:  01/30/24 9am  Shona Prow RN, CCM [place]  VBCI-Population Health RN Care Manager [phone]

## 2024-01-27 ENCOUNTER — Ambulatory Visit: Payer: Self-pay

## 2024-01-27 ENCOUNTER — Encounter: Payer: Self-pay | Admitting: Sports Medicine

## 2024-01-27 ENCOUNTER — Ambulatory Visit: Admitting: Sports Medicine

## 2024-01-27 NOTE — Telephone Encounter (Signed)
 Copied from CRM [id number]. Topic: Appointments - Transfer of Care >> Jan 27, 2024  8:29 AM Frederich PARAS wrote: Pt is requesting to transfer FROM: [facility] Pt is requesting to transfer TO: [facility] Reason for requested transfer: pcp has left  It is the responsibility of the team the patient would like to transfer to (Dr. Quintin Sheerer [place]) to reach out to the patient if for any reason this transfer is not acceptable.

## 2024-01-27 NOTE — Telephone Encounter (Signed)
 Routing to Jenny and Kiana for help with trying to get pt scheduled for a transfer of care appt with Dr. De [place]

## 2024-01-27 NOTE — Telephone Encounter (Signed)
 FYI Only or Action Required?: Action required by provider: request for appointment.  Patient was last seen in primary care on 01/01/2024 by Levi Debby PARAS, MD.  Called Nurse Triage reporting Shoulder Pain.  Symptoms began today.  Interventions attempted: Rest, hydration, or home remedies.  Symptoms are: unchanged.Asking for new pt. Appointment. Has shoulder pain, amputee since June. Warm transfer to [place] in the practice for appointment.  Triage Disposition: See PCP Within 2 Weeks  Patient/caregiver understands and will follow disposition?: Yes   Copied from CRM [id number]. Topic: Clinical - Red Word Triage >> Jan 27, 2024  8:36 AM Levi [name] wrote: [facility] that prompted transfer to Nurse Triage: pain pt called in to do a TOC, and schedule an apt. Pt had below the knee amplitation, he is doing physical therapy, His shoulder injury , generally in some type of pain regularly. Pt had a  pre follow up apt today but it was cancelled due to the pcp leaving unexpectedly Reason for Disposition  Shoulder pain is a chronic symptom (recurrent or ongoing AND present > 4 weeks)  Answer Assessment - Initial Assessment Questions 1. ONSET: When did the pain start?     Old injury 2. LOCATION: Where is the pain located?     Right shoulder 3. PAIN: How bad is the pain? (Scale 1-10; or mild, moderate, severe)     5 if still   8 with movement 4. WORK OR EXERCISE: Has there been any recent work or exercise that involved this part of the body?     no 5. CAUSE: What do you think is causing the shoulder pain?     rotator 6. OTHER SYMPTOMS: Do you have any other symptoms? (e.g., neck pain, swelling, rash, fever, numbness, weakness)     no 7. PREGNANCY: Is there any chance you are pregnant? When was your last menstrual period?     N/a  Protocols used: Shoulder Pain-A-AH

## 2024-01-28 ENCOUNTER — Ambulatory Visit: Payer: Self-pay | Admitting: *Deleted

## 2024-01-28 ENCOUNTER — Telehealth: Payer: Self-pay | Admitting: Physician Assistant

## 2024-01-28 ENCOUNTER — Other Ambulatory Visit: Payer: Self-pay | Admitting: Medical Genetics

## 2024-01-28 DIAGNOSIS — I5042 Chronic combined systolic (congestive) and diastolic (congestive) heart failure: Secondary | ICD-10-CM | POA: Diagnosis not present

## 2024-01-28 DIAGNOSIS — M48062 Spinal stenosis, lumbar region with neurogenic claudication: Secondary | ICD-10-CM

## 2024-01-28 DIAGNOSIS — J440 Chronic obstructive pulmonary disease with acute lower respiratory infection: Secondary | ICD-10-CM | POA: Diagnosis not present

## 2024-01-28 DIAGNOSIS — Z89511 Acquired absence of right leg below knee: Secondary | ICD-10-CM

## 2024-01-28 DIAGNOSIS — M19011 Primary osteoarthritis, right shoulder: Secondary | ICD-10-CM

## 2024-01-28 DIAGNOSIS — J439 Emphysema, unspecified: Secondary | ICD-10-CM | POA: Diagnosis not present

## 2024-01-28 DIAGNOSIS — J189 Pneumonia, unspecified organism: Secondary | ICD-10-CM | POA: Diagnosis not present

## 2024-01-28 DIAGNOSIS — I110 Hypertensive heart disease with heart failure: Secondary | ICD-10-CM | POA: Diagnosis not present

## 2024-01-28 DIAGNOSIS — I480 Paroxysmal atrial fibrillation: Secondary | ICD-10-CM | POA: Diagnosis not present

## 2024-01-28 MED ORDER — HYDROCODONE-ACETAMINOPHEN 10-325 MG PO TABS: 1.0000 | ORAL_TABLET | Freq: Three times a day (TID) | ORAL | 0 refills | Status: DC | PRN

## 2024-01-28 NOTE — Telephone Encounter (Signed)
 [name] called asking for a call back from Levi [name]. Wife state pt is having some difficulties and need medical advice. Please call Levi [name] at [phone].

## 2024-01-28 NOTE — Telephone Encounter (Signed)
 I called and sw pt's wife and she advised that pt had previous injury with shoulder and that since he is doing PT and OT since his BKA surgery it has caused a flare up. She wanted to have xray and compared to previous one in July and see if things have gotten worse and what treatment options he has available. Appt sch for Friday with [place].

## 2024-01-28 NOTE — Telephone Encounter (Signed)
 Cherlyn Hans the physical therapist called to state they were unable to finish therapy today due to the pain.    I spoke with the patient and he is requesting more pain medication. Hydrocodone  10-325mg .   I advised him he may need an appointment.

## 2024-01-28 NOTE — Addendum Note (Signed)
 Addended by: Ashanna Heinsohn on: 01/28/2024 09:06 PM   Modules accepted: Orders

## 2024-01-28 NOTE — Telephone Encounter (Signed)
 FYI Only or Action Required?: Action required by provider: update on patient condition. Pt will be sending request for extension of PT for patient next week  Patient was last seen in primary care on 01/01/2024 by Curtis Debby PARAS, MD.  Called Nurse Triage reporting Shoulder Pain (Shoulder pain, groin pain).  Symptoms began a week ago.  Interventions attempted: OTC medications: tylenol .  Symptoms are: unchanged.  Triage Disposition: See PCP When Office is Open (Within 3 Days)  Patient/caregiver understands and will follow disposition?: declines need for appointment today- PT reporting status   Reason for Disposition  [1] After 2 weeks AND [2] still painful  Answer Assessment - Initial Assessment Questions [name]- PT - [name] : Calling to report limited PT today due to patient discomfort. Patient states he does not like to take pain medication due to constipation- but will premedicate for PT to make it easier. Call to patient- see below. Triage questions reviewed for informational purpose and patient contated.  1. ONSET: When did the pain start?     Recent fall- PT ordered 2. LOCATION: Where is the pain located?     R shoulder- torn rotator cuff, R groin- patient thinks pulled groin muscle 3. PAIN: How bad is the pain? (Scale 1-10; or mild, moderate, severe)     Limited 8/10 4. WORK OR EXERCISE: Has there been any recent work or exercise that involved this part of the body?      na 5. CAUSE: What do you think is causing the shoulder pain?     Patient needs rotator cuff replacement 6. OTHER SYMPTOMS: Do you have any other symptoms? (e.g., neck pain, swelling, rash, fever, numbness, weakness)     Recent surgery- leg amputation- patient is having trouble pushing himself up with shoulder injury. Patient wants to continue PT he does feel it is helping.  Protocols used: Shoulder Pain-A-AH, Shoulder Injury-A-AH   Copied from CRM #[id number]. Topic: Clinical - Red Word  Triage >> Jan 28, 2024 12:09 PM Brittney F wrote: Red Word that prompted transfer to Nurse Triage:   Pain in right shoulder and right side of groin

## 2024-01-29 ENCOUNTER — Other Ambulatory Visit: Payer: Self-pay | Admitting: Urgent Care

## 2024-01-29 DIAGNOSIS — Z89511 Acquired absence of right leg below knee: Secondary | ICD-10-CM

## 2024-01-29 DIAGNOSIS — M48062 Spinal stenosis, lumbar region with neurogenic claudication: Secondary | ICD-10-CM

## 2024-01-29 DIAGNOSIS — M19011 Primary osteoarthritis, right shoulder: Secondary | ICD-10-CM

## 2024-01-29 MED ORDER — HYDROCODONE-ACETAMINOPHEN 10-325 MG PO TABS: 1.0000 | ORAL_TABLET | Freq: Three times a day (TID) | ORAL | 0 refills | Status: AC | PRN

## 2024-01-29 MED ORDER — HYDROCODONE-ACETAMINOPHEN 10-325 MG PO TABS: 1.0000 | ORAL_TABLET | Freq: Three times a day (TID) | ORAL | 0 refills | Status: DC | PRN

## 2024-01-29 NOTE — Progress Notes (Signed)
 There appear to be tech issues with being able to send Rx for norco electronically. Additionally has not been printing on proper Rx paper. Will re-send.

## 2024-01-29 NOTE — Addendum Note (Signed)
 Addended by: Zeddie Njie on: 01/29/2024 01:08 PM   Modules accepted: Orders

## 2024-01-30 ENCOUNTER — Other Ambulatory Visit: Payer: Self-pay

## 2024-01-30 ENCOUNTER — Encounter: Payer: Self-pay | Admitting: Physician Assistant

## 2024-01-30 ENCOUNTER — Other Ambulatory Visit: Payer: Self-pay | Admitting: Internal Medicine

## 2024-01-30 ENCOUNTER — Ambulatory Visit (INDEPENDENT_AMBULATORY_CARE_PROVIDER_SITE_OTHER): Admitting: Physician Assistant

## 2024-01-30 DIAGNOSIS — Z89511 Acquired absence of right leg below knee: Secondary | ICD-10-CM

## 2024-01-30 DIAGNOSIS — G8929 Other chronic pain: Secondary | ICD-10-CM

## 2024-01-30 DIAGNOSIS — M25511 Pain in right shoulder: Secondary | ICD-10-CM

## 2024-01-30 DIAGNOSIS — I480 Paroxysmal atrial fibrillation: Secondary | ICD-10-CM

## 2024-01-30 NOTE — Progress Notes (Signed)
 Office Visit Note   Patient: Levi [name]           Date of Birth: Oct 22, 1945           MRN: [id number] Visit Date: 01/30/2024              Requested by: No referring provider defined for this encounter. PCP: System, Provider Not In  Chief Complaint  Patient presents with   Right Shoulder - Pain      HPI: 78 y/o male with re current shoulder pain.  The pt's wife and she advised that pt had previous injury with shoulder and that since he is doing PT and OT since his BKA surgery it has caused a flare up.  Last x ray was performed on 11/25/23 which showed:  1. Superior subluxation of the humeral head abutting the undersurface of the acromion, suggestive of rotator cuff arthropathy. 2. Mild to moderate acromioclavicular and mild glenohumeral osteoarthritis.  Assessment & Plan: Visit Diagnoses:  1. Chronic right shoulder pain     Plan: Offered corticosteroid injections. If conservative measures fail, surgery may be considered, such as a reverse shoulder replacement for severe arthropathy. With irrepairable rotator cuff tear, pseudo-paralytic shoulder and early rotator cuff tear arthropathy. Pain and dysfunction interfered with quality of life and nonoperative treatment with activity modification, NSAIDS and injections failed. I will refer him to Dr. Addie to be considered for right reverse shoulder.  He will have a very difficult time regaining a functional life with the right BKA if he is unable to use the right UE to assist with ambulation using assistive devises.    Follow-Up Instructions: Return if symptoms worsen or fail to improve.   Ortho Exam  Patient is alert, oriented, no adenopathy, well-dressed, normal affect, normal respiratory effort. Minimal abduction, pain free adduction with the assistance of the left arm.  Minimal internal and external shoulder rotation.  He leans to the left to try and uses his right shoulder.  The right shoulder is higher than the left when  sitting upright.  Pain with weightbearing to transfer from his chair.      Imaging: No results found. No images are attached to the encounter.  Labs: Lab Results  Component Value Date   HGBA1C 5.8 (H) 07/05/2022   HGBA1C 5.5 10/21/2019   ESRSEDRATE 30 (H) 11/08/2023   ESRSEDRATE 11 10/08/2023   ESRSEDRATE 17 03/31/2023   CRP 21.0 (H) 11/10/2023   CRP 16.5 (H) 11/08/2023   CRP 11.7 (H) 03/31/2023   LABURIC 5.6 06/17/2023   LABURIC 6.4 07/05/2022   REPTSTATUS 12/31/2023 FINAL 12/26/2023   GRAMSTAIN NO WBC SEEN NO ORGANISMS SEEN  10/16/2023   CULT  12/26/2023    NO GROWTH 5 DAYS Performed at [facility], [address], [place], [place] [address]    [facility] STAPHYLOCOCCUS COHNII 10/16/2023     Lab Results  Component Value Date   ALBUMIN 3.8 01/01/2024   ALBUMIN 2.4 (L) 12/27/2023   ALBUMIN 3.2 (L) 12/26/2023    Lab Results  Component Value Date   MG 1.8 12/28/2023   MG 1.7 11/10/2023   MG 2.1 08/13/2023   No results found for: VD25OH  No results found for: PREALBUMIN    Latest Ref Rng & Units 01/01/2024    2:45 PM 12/29/2023    7:07 AM 12/28/2023    6:06 AM  CBC EXTENDED  WBC 3.4 - 10.8 x10E3/uL 10.9  8.3  8.7   RBC 4.14 - 5.80  x10E6/uL 4.27  3.93  3.74   Hemoglobin 13.0 - 17.7 g/dL 87.7  88.4  89.1   HCT 37.5 - 51.0 % 39.5  36.3  34.6   Platelets 150 - 450 x10E3/uL 192  141  119   NEUT# 1.4 - 7.0 x10E3/uL 9.4     Lymph# 0.7 - 3.1 x10E3/uL 0.7        There is no height or weight on file to calculate BMI.  Orders:  Orders Placed This Encounter  Procedures   XR Shoulder Right   Ambulatory referral to Orthopedic Surgery   No orders of the defined types were placed in this encounter.    Procedures: No procedures performed  Clinical Data: No additional findings.  ROS:  All other systems negative, except as noted in the HPI. Review of Systems  Objective: Vital Signs: There were no vitals taken for this visit.  Specialty Comments:   No specialty comments available.  PMFS History: Patient Active Problem List   Diagnosis Date Noted   Hyperkalemia 01/02/2024   CAP (community acquired pneumonia) 12/26/2023   Depression due to physical illness 12/15/2023   Hematochezia 07/25/2023   Closed fracture of ramus of right pubis (HCC) 04/07/2023   S/P PICC central line placement 03/31/2023   Chronic superficial venous thrombosis of both lower extremities 11/18/2022   Primary osteoarthritis, right shoulder 07/05/2022   Shingles 05/16/2022   Vertigo 04/04/2022   BPH (benign prostatic hyperplasia) 02/21/2022   Annual physical exam 01/22/2022   History of right below knee amputation (HCC) 01/22/2022   Atypical chest pain    Chronic anticoagulation 03/15/2021   Peripheral neuropathy 02/22/2021   Chronic systolic heart failure (HCC) 02/22/2021   Essential (primary) hypertension 11/15/2020   Arthritis of carpometacarpal (CMC) joint of right thumb 08/07/2020   MIld Cellulitis of residual R limb at BKA site 03/22/2020   COPD (chronic obstructive pulmonary disease) (HCC) 03/21/2020   GERD (gastroesophageal reflux disease) status post Nissen fundoplication 12/03/2017   Lumbar stenosis status post T12-S1 fusion 04/07/2017   History of total right hip arthroplasty 07/31/2015   Primary osteoarthritis of right knee 06/17/2014   Hyperlipidemia    History of colonic polyps 02/09/2013   Paroxysmal atrial fibrillation (HCC) 05/11/2012   Osteoarthritis of left knee 01/10/2012   Seasonal and perennial allergic rhinitis 07/23/2010   Asthma, moderate persistent 03/06/2009   Past Medical History:  Diagnosis Date   Agent orange exposure 1970's   takes Imdur ,Diltiazem , and Atacand  daily   Agent orange exposure    ALLERGIC RHINITIS    Allergy     seasonal allergies   Anemia    iron  deficiency   Arthritis    Asthma    uses inhaler and nebulizers   Atrial fibrillation (HCC)    Bruises easily    d/t prednisone  daily   Cataract     Cellulitis 03/22/2020   Chest wall pain 09/21/2019   L scapula, reported 4/27 21/     CHF (congestive heart failure) (HCC)    dx-on meds to treat   Chronic bronchitis (HCC)    used to get it q yr; last time ?2008 (04/28/2012)   Clotting disorder (HCC) 2019   On blood thinner when I bleed it is for a consideral amount of time. This is tegardless of size of wound.   COPD (chronic obstructive pulmonary disease) (HCC)    agent orange exposure   Degenerative disk disease    everywhere (04/28/2012)   Depression due to physical illness 12/15/2023  Diverticulitis    Dysrhythmia    afib   Elevated uric acid in blood    takes Allopurinol  daily   Emphysema of lung (HCC)    Enlarged prostate    but not on any meds   GERD (gastroesophageal reflux disease)    takes Protonix  daily   H/O hiatal hernia    Headache    History of colonoscopy    History of pneumonia    a. 2010   Hyperlipidemia    takes Lipitor daily; pt. states he takes a preventive   Hypothyroidism    Joint pain    Joint swelling    Leukocytosis    MSSA bacteremia 03/19/2023   Nausea and vomiting 11/12/2022   Neuromuscular disorder (HCC)    bilat legs, and bilat arms   Neuropathy    Osteoporosis 2012   PAF (paroxysmal atrial fibrillation) (HCC)    a. Dx 04/2012, CHA2DS2VASc = 1 (age);  b. 04/2012 Echo: EF 40-50%, mild MR.   Personal history of colonic adenomas 02/09/2013   Pneumonia    Sepsis (HCC) 03/18/2023   Shortness of breath dyspnea    Thyroid  disease    no taking meds-caused dizziness    Family History  Problem Relation Age of Onset   Diabetes Mother    Heart disease Father        Died 62, MI   Cancer Father    Hyperlipidemia Father    Stroke Father    Prostate cancer Brother    Colon polyps Brother    Diabetes Brother    Colon polyps Brother    Asthma Brother    COPD Brother    Colon cancer Brother        Dx age 7   Cancer Brother    Colon cancer Other    Esophageal cancer Neg Hx    Rectal  cancer Neg Hx    Stomach cancer Neg Hx    Pancreatic cancer Neg Hx    Kidney disease Neg Hx    Liver disease Neg Hx    Neuropathy Neg Hx     Past Surgical History:  Procedure Laterality Date   52 HOUR PH STUDY N/A 05/30/2021   Procedure: 24 HOUR PH STUDY;  Surgeon: San Sandor GAILS, DO;  Location: [facility];  Service: Gastroenterology;  Laterality: N/A;   AMPUTATION Right 11/14/2023   Procedure: AMPUTATION BELOW KNEE;  Surgeon: Harden Jerona GAILS, MD;  Location: [facility] OR;  Service: Orthopedics;  Laterality: Right;   AMPUTATION TOE Bilateral 08/13/2023   Procedure: LEFT SECOND TOE AMPUTATION AND RIGHT FIFTH TOE AMPUTATION;  Surgeon: Malvin Marsa FALCON, DPM;  Location: [facility] OR;  Service: Orthopedics/Podiatry;  Laterality: Bilateral;   ANTERIOR CERVICAL DECOMP/DISCECTOMY FUSION N/A 07/21/2012   Procedure: ANTERIOR CERVICAL DECOMPRESSION/DISCECTOMY FUSION 2 LEVELS;  Surgeon: Victory Gens, MD;  Location: [facility];  Service: Neurosurgery;  Laterality: N/A;  C4-5 C5-6 Anterior cervical decompression/diskectomy/fusion   ANTERIOR LAT LUMBAR FUSION N/A 04/07/2017   Procedure: Thoracic twelve-Lumbar one Anterolateral decompression/fusion;  Surgeon: Gens Victory, MD;  Location: [facility];  Service: Neurosurgery;  Laterality: N/A;   ARTHRODESIS FOOT WITH WEIL OSTEOTOMY Left 11/07/2022   Procedure: LEFT THIRD METATARSOPHALANGEAL CAPSULOTOMY, WEIL OSTEOTOMY;  Surgeon: Elsa Lonni SAUNDERS, MD;  Location: [facility] OR;  Service: Orthopedics;  Laterality: Left;  LENGTH OF SURGERY: 60 MINUTES   BACK SURGERY     x 3   BELOW KNEE LEG AMPUTATION Right    June 2025   BONE EXOSTOSIS EXCISION Right 10/16/2023  Procedure: EXCISION, EXOSTOSIS;  Surgeon: Janit Thresa HERO, DPM;  Location: [facility];  Service: Orthopedics/Podiatry;  Laterality: Right;   CARDIAC CATHETERIZATION  11/2008   Dr. Jeffrie - 20% calcified non flow limiting left main, 50% EF apical hypokinesis   CARPAL TUNNEL RELEASE Bilateral    CERVICAL DISC  ARTHROPLASTY N/A 04/07/2017   Procedure: Cervical six-seven Disc arthroplasty;  Surgeon: Colon Shove, MD;  Location: [facility] OR;  Service: Neurosurgery;  Laterality: N/A;   CHEST TUBE INSERTION Left 04/11/2017   Procedure: CHEST TUBE INSERTION;  Surgeon: Fleeta Hanford Coy, MD;  Location: [facility] OR;  Service: Thoracic;  Laterality: Left;   CHOLECYSTECTOMY     COLONOSCOPY  2016   CG-MAC-miralax (good)servere TICS/TA x 2   ESOPHAGEAL MANOMETRY N/A 05/30/2021   Procedure: ESOPHAGEAL MANOMETRY (EM);  Surgeon: San Sandor GAILS, DO;  Location: [facility];  Service: Gastroenterology;  Laterality: N/A;  ph impedence   ESOPHAGOGASTRODUODENOSCOPY     EYE SURGERY Bilateral 2011   steroidal encapsulation (04/28/2012)   FOOT SURGERY Right    x 2   JOINT REPLACEMENT     Many surgeries including 5 joit replacements   KNEE ARTHROSCOPY Bilateral    LATERAL / POSTERIOR COMBINED FUSION LUMBAR SPINE  2012   LEFT ATRIAL APPENDAGE OCCLUSION N/A 02/22/2021   Procedure: LEFT ATRIAL APPENDAGE OCCLUSION;  Surgeon: Cindie Ole DASEN, MD;  [facility];  Service: Cardiovascular;  Laterality: N/A;   NEUROPLASTY / TRANSPOSITION ULNAR NERVE AT ELBOW Bilateral    POLYPECTOMY  2016   severe TICS/TA x 2   POSTERIOR CERVICAL LAMINECTOMY WITH MET- RX Right 06/11/2021   Procedure: Right Cervical six-seven  Laminectomy and foraminotomy with metrex;  Surgeon: Colon Shove, MD;  Location: [facility];  Service: Neurosurgery;  Laterality: Right;   REFRACTIVE SURGERY Left 07/07/2023   laser surgery but still has floaters   REVERSE SHOULDER ARTHROPLASTY  04/28/2012   Procedure: REVERSE SHOULDER ARTHROPLASTY;  Surgeon: Eva Elsie Herring, MD;  Location: [facility] OR;  Service: Orthopedics;  Laterality: Left;  Left shouder reverse total shoulder arthroplasty   SHOULDER SURGERY     SPINE SURGERY     I have had 7 spinal surgeries   TEE WITHOUT CARDIOVERSION N/A 02/22/2021   Procedure: TRANSESOPHAGEAL ECHOCARDIOGRAM (TEE);   Surgeon: Cindie Ole DASEN, MD;  Location: [facility] INVASIVE CV LAB;  Service: Cardiovascular;  Laterality: N/A;   TENDON REPAIR Right 08/07/2020   Procedure: RIGHT HAND LIGAMENT RECONSTRUCTION AND TENDON INTERPOSITION;  Surgeon: Yvone Rush, MD;  Location: [facility];  Service: Orthopedics;  Laterality: Right;   TONSILLECTOMY AND ADENOIDECTOMY  1954   TOTAL HIP ARTHROPLASTY Left 2011   left (04/28/2012)   TOTAL HIP ARTHROPLASTY Right 07/31/2015   Procedure: TOTAL HIP ARTHROPLASTY ANTERIOR APPROACH;  Surgeon: Rush Yvone, MD;  Location: [facility];  Service: Orthopedics;  Laterality: Right;   TOTAL KNEE ARTHROPLASTY  01/10/2012   Procedure: TOTAL KNEE ARTHROPLASTY;  Surgeon: Rush LITTIE Yvone, MD;  Location: [facility];  Service: Orthopedics;;  left total knee arthroplasty   TOTAL KNEE ARTHROPLASTY Right 06/17/2014   Procedure: TOTAL KNEE ARTHROPLASTY;  Surgeon: Rush LITTIE Yvone, MD;  Location: [facility];  Service: Orthopedics;  Laterality: Right;   toupet fundolplication  11/01/2021   TRANSESOPHAGEAL ECHOCARDIOGRAM (CATH LAB) N/A 03/24/2023   Procedure: TRANSESOPHAGEAL ECHOCARDIOGRAM;  Surgeon: Mona Vinie BROCKS, MD;  [facility];  Service: Cardiovascular;  Laterality: N/A;   Social History   Occupational History   Occupation: Retired    Comment: Nurse, 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

## 2024-01-30 NOTE — Transitions of Care (Post Inpatient/ED Visit) (Signed)
 Transition of Care Week #5 [facility] Program Closure  Visit Note  01/30/2024  Name: Levi [name] MRN: [id number]          DOB: [birth year]  Situation: Patient enrolled in [facility] 30-day program. Visit completed with patient by telephone.   Background: Admit/Discharge Date: 8/1 - 8/4  Levi [name] Primary Diagnosis: Mild Cellulitis of residual R limb at BKA site - amputation in June 20, 25  Admit/Discharge Date:  6/14 - 6/22  Levi [name]   Primary Diagnosis: Right Foot Infection - Sepsis  Initial Transition Care Management Follow-up Telephone Call    Past Medical History:  Diagnosis Date   Agent orange exposure 1970's   takes Imdur ,Diltiazem , and Atacand  daily   Agent orange exposure    ALLERGIC RHINITIS    Allergy     seasonal allergies   Anemia    iron  deficiency   Arthritis    Asthma    uses inhaler and nebulizers   Atrial fibrillation (HCC)    Bruises easily    d/t prednisone  daily   Cataract    Cellulitis 03/22/2020   Chest wall pain 09/21/2019   L scapula, reported 4/27 21/     CHF (congestive heart failure) (HCC)    dx-on meds to treat   Chronic bronchitis (HCC)    used to get it q yr; last time ?2008 (04/28/2012)   Clotting disorder (HCC) 2019   On blood thinner when I bleed it is for a consideral amount of time. This is tegardless of size of wound.   COPD (chronic obstructive pulmonary disease) (HCC)    agent orange exposure   Degenerative disk disease    everywhere (04/28/2012)   Depression due to physical illness 12/15/2023   Diverticulitis    Dysrhythmia    afib   Elevated uric acid in blood    takes Allopurinol  daily   Emphysema of lung (HCC)    Enlarged prostate    but not on any meds   GERD (gastroesophageal reflux disease)    takes Protonix  daily   H/O hiatal hernia    Headache    History of colonoscopy    History of pneumonia    a. 2010   Hyperlipidemia    takes Lipitor daily; pt. states he takes a preventive   Hypothyroidism    Joint pain    Joint  swelling    Leukocytosis    MSSA bacteremia 03/19/2023   Nausea and vomiting 11/12/2022   Neuromuscular disorder (HCC)    bilat legs, and bilat arms   Neuropathy    Osteoporosis 2012   PAF (paroxysmal atrial fibrillation) (HCC)    a. Dx 04/2012, CHA2DS2VASc = 1 (age);  b. 04/2012 Echo: EF 40-50%, mild MR.   Personal history of colonic adenomas 02/09/2013   Pneumonia    Sepsis (HCC) 03/18/2023   Shortness of breath dyspnea    Thyroid  disease    no taking meds-caused dizziness    Assessment: Patient Reported Symptoms: Cognitive Cognitive Status: No symptoms reported, Normal speech and language skills, Alert and oriented to person, place, and time      Neurological Neurological Review of Symptoms: Other: [place] Neurological Symptoms/Conditions [RPT]: patient reports neuropathy is bothering him today and reviewed medications and educatetd on all medications that should help and when to call MD - PCP left practice - new PCP scheduled 04/28/24 with MD at [facility] Management Strategies: Medication therapy  HEENT HEENT Symptoms Reported: No symptoms reported      Cardiovascular Cardiovascular Symptoms  Reported: Other: Other Cardiovascular Symptoms: Patient states he now has seatbelt for wheelchair and has had no further falls - denies issues at this time - denies dizziness - PT/OT continues to see patient in home - SN had last visit last week Cardiovascular Management Strategies: Medical device  Respiratory Respiratory Symptoms Reported: No symptoms reported    Endocrine Endocrine Symptoms Reported: No symptoms reported Endocrine Comment: patient states when I get dizzy I have some blurry vision Denies during call  Gastrointestinal Gastrointestinal Symptoms Reported: No symptoms reported Additional Gastrointestinal Details: Patient denies issues during this call      Genitourinary Genitourinary Symptoms Reported: Frequency Additional Genitourinary Details: Related to  Flomax  and Lasix     Integumentary Integumentary Symptoms Reported: Other Other Integumentary Symptoms: patient states dots are still there but getting better    Musculoskeletal Musculoskelatal Symptoms Reviewed: No symptoms reported Other Musculoskeletal Symptoms: Patient denies addiional fall and now has wheelchair seatbealt to help prevent further falls        Psychosocial Psychosocial Symptoms Reported: Depression - if selected complete PHQ 2-9         There were no vitals filed for this visit.  Medications Reviewed Today     Reviewed by Lauro Shona LABOR, RN (Registered Nurse) on 01/30/24 at [phone]  Med List Status: <None>   Medication Order Taking? Sig Documenting Provider Last Dose Status Informant  acetaminophen  (TYLENOL ) 325 MG tablet [id number] Yes Take 1-2 tablets (325-650 mg total) by mouth every 6 (six) hours as needed for mild pain (pain score 1-3) (or temp > 100.5). Love, Sharlet RAMAN, PA-C  Active Self, Spouse/Significant Other  albuterol  (VENTOLIN  HFA) 108 (90 Base) MCG/ACT inhaler [id number] Yes INHALE 2 PUFFS BY MOUTH EVERY 4 HOURS AS NEEDED FOR WHEEZE OR FOR SHORTNESS OF BREATH Young, Clinton D, MD  Active   apixaban  (ELIQUIS ) 5 MG TABS tablet [id number] Yes Take 1 tablet (5 mg total) by mouth 2 (two) times daily. Maurice Sharlet RAMAN, PA-C  Active Self, Spouse/Significant Other  atorvastatin  (LIPITOR) 20 MG tablet [id number] Yes Take 1 tablet (20 mg total) by mouth daily. Jeffrie Oneil BROCKS, MD  Active Self, Spouse/Significant Other  cyclobenzaprine  (FLEXERIL ) 10 MG tablet [id number] Yes TAKE 1 TABLET BY MOUTH THREE TIMES A DAY AS NEEDED FOR MUSCLE SPASMS Curtis Debby PARAS, MD  Active   diltiazem  (CARDIZEM  CD) 180 MG 24 hr capsule [id number] Yes Take 1 capsule (180 mg total) by mouth daily. Jeffrie Oneil BROCKS, MD  Active Spouse/Significant Other, Self  DULoxetine  (CYMBALTA ) 30 MG capsule [id number] Yes TAKE 1 CAPSULE BY MOUTH EVERY DAY Curtis Debby PARAS, MD  Active   feeding  supplement (ENSURE PLUS HIGH PROTEIN) LIQD [id number]  Take 237 mLs by mouth 2 (two) times daily between meals.  Patient not taking: Reported on 12/30/2023   Raenelle Donalda HERO, MD  Active Self, Spouse/Significant Other  ferrous gluconate  (FERGON) 324 MG tablet [id number] Yes Take 1 tablet (324 mg total) by mouth daily. Maurice Sharlet RAMAN, PA-C  Active Self, Spouse/Significant Other  furosemide  (LASIX ) 40 MG tablet [id number] Yes Take 0.5 tablets (20 mg total) by mouth daily as needed for edema. Franchot Novel, MD  Active   hydrocerin [facility]) CREA [id number] Yes Apply 1 Application topically daily. To left foot  Patient taking differently: Apply 1 Application topically daily. To left foot  - Wife reports patient switched Nivea   Love, Pamela S, PA-C  Active Self, Spouse/Significant Other  HYDROcodone -acetaminophen  (NORCO) 10-325 MG tablet [id number] Yes Take 1 tablet by mouth every  8 (eight) hours as needed for up to 5 days. Crain, Whitney L, PA  Active   ipratropium-albuterol  (DUONEB) 0.5-2.5 (3) MG/3ML SOLN [id number] Yes 1 NEBULE EVERY 6 HOURS AS NEEDED. **J45.3** Neysa Rama D, MD  Active Spouse/Significant Other, Self  isosorbide  mononitrate (IMDUR ) 30 MG 24 hr tablet [id number] Yes Take 1 tablet (30 mg total) by mouth daily. Jeffrie Oneil BROCKS, MD  Active Self, Spouse/Significant Other  melatonin 5 MG TABS [id number] Yes Take 1 tablet (5 mg total) by mouth at bedtime as needed. Maurice Sharlet RAMAN, PA-C  Active Self, Spouse/Significant Other  Multiple Vitamin (MULTIVITAMIN WITH MINERALS) TABS tablet [id number] Yes Take 1 tablet by mouth daily. Maurice Sharlet RAMAN, PA-C  Active Self, Spouse/Significant Other  Olodaterol HCl (STRIVERDI RESPIMAT) 2.5 MCG/ACT AERS [id number] Yes INHALE 2 PUFFS BY MOUTH INTO THE LUNGS DAILY Neysa Rama BIRCH, MD  Active Spouse/Significant Other, Self  pantoprazole  (PROTONIX ) 40 MG tablet [id number] Yes Take 1 tablet (40 mg total) by mouth 2 (two) times daily before a meal. Love, Pamela S,  PA-C  Active Self, Spouse/Significant Other  predniSONE  (DELTASONE ) 5 MG tablet [id number] Yes 3 tabs daily Neysa Rama D, MD  Active Self, Spouse/Significant Other  pregabalin  (LYRICA ) 150 MG capsule [id number] Yes Take 1 capsule (150 mg total) by mouth 2 (two) times daily. Urbano Albright, MD  Active   tamsulosin  (FLOMAX ) 0.4 MG CAPS capsule [id number] Yes Take 1 capsule (0.4 mg total) by mouth at bedtime. Love, Sharlet RAMAN, PA-C  Active Self, Spouse/Significant Other            Recommendation:   Referral to: CCM  Follow Up Plan:   Referral to RN Case Manager Closing From:  Transitions of Care Program  Shona Prow RN, CCM [facility]  VBCI-Population Health RN Care Manager [phone]

## 2024-02-02 ENCOUNTER — Ambulatory Visit
Admission: RE | Admit: 2024-02-02 | Discharge: 2024-02-02 | Disposition: A | Discharge status: Home / Self Care | Payer: Self-pay | Attending: Family Medicine | Admitting: Family Medicine

## 2024-02-02 ENCOUNTER — Telehealth: Payer: Self-pay | Admitting: Cardiology

## 2024-02-02 ENCOUNTER — Ambulatory Visit: Payer: Self-pay

## 2024-02-02 ENCOUNTER — Other Ambulatory Visit: Payer: Self-pay

## 2024-02-02 ENCOUNTER — Telehealth: Payer: Self-pay

## 2024-02-02 VITALS — BP 127/77 | HR 95 | Temp 97.9°F | Resp 16

## 2024-02-02 DIAGNOSIS — I480 Paroxysmal atrial fibrillation: Secondary | ICD-10-CM | POA: Diagnosis not present

## 2024-02-02 DIAGNOSIS — I110 Hypertensive heart disease with heart failure: Secondary | ICD-10-CM | POA: Diagnosis not present

## 2024-02-02 DIAGNOSIS — J439 Emphysema, unspecified: Secondary | ICD-10-CM | POA: Diagnosis not present

## 2024-02-02 DIAGNOSIS — J440 Chronic obstructive pulmonary disease with acute lower respiratory infection: Secondary | ICD-10-CM | POA: Diagnosis not present

## 2024-02-02 DIAGNOSIS — H6122 Impacted cerumen, left ear: Secondary | ICD-10-CM | POA: Diagnosis not present

## 2024-02-02 DIAGNOSIS — I5042 Chronic combined systolic (congestive) and diastolic (congestive) heart failure: Secondary | ICD-10-CM | POA: Diagnosis not present

## 2024-02-02 DIAGNOSIS — J189 Pneumonia, unspecified organism: Secondary | ICD-10-CM | POA: Diagnosis not present

## 2024-02-02 DIAGNOSIS — J069 Acute upper respiratory infection, unspecified: Secondary | ICD-10-CM

## 2024-02-02 MED ORDER — AMOXICILLIN-POT CLAVULANATE 875-125 MG PO TABS
1.0000 | ORAL_TABLET | Freq: Two times a day (BID) | ORAL | 0 refills | Status: DC
Start: 2024-02-02 — End: 2024-02-09

## 2024-02-02 MED ORDER — APIXABAN 5 MG PO TABS: 5.0000 mg | ORAL_TABLET | Freq: Two times a day (BID) | ORAL | 1 refills | Status: AC

## 2024-02-02 NOTE — Telephone Encounter (Signed)
 Attempted call to patient. Left a voice mail message with verbal orders on [facility] confidential voice mail.

## 2024-02-02 NOTE — Telephone Encounter (Signed)
 Prescription refill request for Eliquis  received. Indication:afib Last office visit:5/25 Scr:0.76  8/25 Age: 78 Weight:83.9  kg  Prescription refilled

## 2024-02-02 NOTE — Telephone Encounter (Signed)
 Refill request for Eliquis .  Last OV 12/18/2023.  Dr. Neysa, please advise if this can be refilled.  Allergies  Allergen Reactions   Levaquin  [Levofloxacin ] Itching    Tolerated ciprofloxacin  in 2022.

## 2024-02-02 NOTE — Telephone Encounter (Signed)
*  STAT* If patient is at the pharmacy, call can be transferred to refill team.   1. Which medications need to be refilled? (please list name of each medication and dose if known)   apixaban  (ELIQUIS ) 5 MG TABS tablet    4. Which pharmacy/location (including street and city if local pharmacy) is medication to be sent to?  [id number] - [place], [place] - [address]     5. Do they need a 30 day or 90 day supply? 90

## 2024-02-02 NOTE — Telephone Encounter (Signed)
 Copied from CRM [id number]. Topic: Clinical - Home Health Verbal Orders >> Feb 02, 2024  9:55 AM Kevelyn M wrote: Caller/Agency: [name] Callback Number: [phone] Service Requested: Occupational Therapy Frequency: 1 week 1, 2 week 3, 1 week 5 Any new concerns about the patient? Yes, Shoulder replacement by Dr. Addie soon.

## 2024-02-02 NOTE — Telephone Encounter (Signed)
 FYI Only or Action Required?: FYI only for provider.  Patient was last seen in primary care on 01/01/2024 by Curtis Debby PARAS, MD.  Called Nurse Triage reporting Shoulder Pain.  Symptoms began several months ago.  Interventions attempted: Prescription medications: Norco.  Symptoms are: gradually worsening.  Triage Disposition: See PCP Within 2 Weeks  Patient/caregiver understands and will follow disposition?: Yes Patient followed by ortho for chronic shoulder pain, seen on 9/5.  Ortho surgery referral pending. Patient calling to reschedule [facility] appt with a provider at [facility]. Appt scheduled for 9/15 at 10:20 with Dr. Jerrell  Copied from CRM [id number]. Topic: Clinical - Red Word Triage >> Feb 02, 2024  1:35 PM Levi [name] wrote: Reason for CRM: Patient's wife, Levi [name], called in stating that patient's right rotator cuff is worsening in pain over the last couple weeks. Reason for Disposition  Shoulder pain is a chronic symptom (recurrent or ongoing AND present > 4 weeks)  Answer Assessment - Initial Assessment Questions 1. ONSET: When did the pain start?     Several months, worsening over the past couple of weeks  2. LOCATION: Where is the pain located?     Right shoulder   3. PAIN: How bad is the pain? (Scale 1-10; or mild, moderate, severe)     Moderate  4. WORK OR EXERCISE: Has there been any recent work or exercise that involved this part of the body?     No  5. CAUSE: What do you think is causing the shoulder pain?     Chronic rotator cuff pain  6. OTHER SYMPTOMS: Do you have any other symptoms? ([name].g., neck pain, swelling, rash, fever, numbness, weakness)     No  7. PREGNANCY: Is there any chance you are pregnant? When was your last menstrual period?     N/a  Protocols used: Shoulder Pain-A-AH

## 2024-02-02 NOTE — Telephone Encounter (Signed)
 Forwarding message to Zada Palin, NP covering for Dr. Curtis this week.

## 2024-02-02 NOTE — Discharge Instructions (Signed)
 Try to increase your fluids.  Drink more water  Take antibiotic 2 times a day for 1 week Call or return for problems

## 2024-02-02 NOTE — ED Provider Notes (Signed)
 Levi [name] [facility]    CSN: [id number] Arrival date & time: 02/02/24  1104      History   Chief Complaint Chief Complaint  Patient presents with   Ear Fullness    Entered by patient    HPI Levi [name] is a 78 y.o. male.   HPI  Patient has had an upper respiratory infection for over a week.  Cough and cold.  Coughing up yellow sputum.  Sinus congestion.  He states he has some ear pressure and pain on the left.  Is here for evaluation.  Past Medical History:  Diagnosis Date   Agent orange exposure 1970's   takes Imdur ,Diltiazem , and Atacand  daily   Agent orange exposure    ALLERGIC RHINITIS    Allergy     seasonal allergies   Anemia    iron  deficiency   Arthritis    Asthma    uses inhaler and nebulizers   Atrial fibrillation (HCC)    Bruises easily    d/t prednisone  daily   Cataract    Cellulitis 03/22/2020   Chest wall pain 09/21/2019   L scapula, reported 4/27 21/     CHF (congestive heart failure) (HCC)    dx-on meds to treat   Chronic bronchitis (HCC)    used to get it q yr; last time ?2008 (04/28/2012)   Clotting disorder (HCC) 2019   On blood thinner when I bleed it is for a consideral amount of time. This is tegardless of size of wound.   COPD (chronic obstructive pulmonary disease) (HCC)    agent orange exposure   Degenerative disk disease    everywhere (04/28/2012)   Depression due to physical illness 12/15/2023   Diverticulitis    Dysrhythmia    afib   Elevated uric acid in blood    takes Allopurinol  daily   Emphysema of lung (HCC)    Enlarged prostate    but not on any meds   GERD (gastroesophageal reflux disease)    takes Protonix  daily   H/O hiatal hernia    Headache    History of colonoscopy    History of pneumonia    a. 2010   Hyperlipidemia    takes Lipitor daily; pt. states he takes a preventive   Hypothyroidism    Joint pain    Joint swelling    Leukocytosis    MSSA bacteremia 03/19/2023   Nausea and vomiting 11/12/2022    Neuromuscular disorder (HCC)    bilat legs, and bilat arms   Neuropathy    Osteoporosis 2012   PAF (paroxysmal atrial fibrillation) (HCC)    a. Dx 04/2012, CHA2DS2VASc = 1 (age);  b. 04/2012 Echo: EF 40-50%, mild MR.   Personal history of colonic adenomas 02/09/2013   Pneumonia    Sepsis (HCC) 03/18/2023   Shortness of breath dyspnea    Thyroid  disease    no taking meds-caused dizziness    Patient Active Problem List   Diagnosis Date Noted   Hyperkalemia 01/02/2024   CAP (community acquired pneumonia) 12/26/2023   Depression due to physical illness 12/15/2023   Hematochezia 07/25/2023   Closed fracture of ramus of right pubis (HCC) 04/07/2023   S/P PICC central line placement 03/31/2023   Chronic superficial venous thrombosis of both lower extremities 11/18/2022   Primary osteoarthritis, right shoulder 07/05/2022   Shingles 05/16/2022   Vertigo 04/04/2022   BPH (benign prostatic hyperplasia) 02/21/2022   Annual physical exam 01/22/2022   History of right below knee amputation (HCC)  01/22/2022   Atypical chest pain    Chronic anticoagulation 03/15/2021   Peripheral neuropathy 02/22/2021   Chronic systolic heart failure (HCC) 02/22/2021   Essential (primary) hypertension 11/15/2020   Arthritis of carpometacarpal (CMC) joint of right thumb 08/07/2020   MIld Cellulitis of residual R limb at BKA site 03/22/2020   COPD (chronic obstructive pulmonary disease) (HCC) 03/21/2020   GERD (gastroesophageal reflux disease) status post Nissen fundoplication 12/03/2017   Lumbar stenosis status post T12-S1 fusion 04/07/2017   History of total right hip arthroplasty 07/31/2015   Primary osteoarthritis of right knee 06/17/2014   Hyperlipidemia    History of colonic polyps 02/09/2013   Paroxysmal atrial fibrillation (HCC) 05/11/2012   Osteoarthritis of left knee 01/10/2012   Seasonal and perennial allergic rhinitis 07/23/2010   Asthma, moderate persistent 03/06/2009    Past Surgical  History:  Procedure Laterality Date   15 HOUR PH STUDY N/A 05/30/2021   Procedure: 24 HOUR PH STUDY;  Surgeon: San Sandor GAILS, DO;  Location: [facility];  Service: Gastroenterology;  Laterality: N/A;   AMPUTATION Right 11/14/2023   Procedure: AMPUTATION BELOW KNEE;  Surgeon: Harden Jerona GAILS, MD;  Location: [facility] OR;  Service: Orthopedics;  Laterality: Right;   AMPUTATION TOE Bilateral 08/13/2023   Procedure: LEFT SECOND TOE AMPUTATION AND RIGHT FIFTH TOE AMPUTATION;  Surgeon: Malvin Marsa FALCON, DPM;  Location: [facility] OR;  Service: Orthopedics/Podiatry;  Laterality: Bilateral;   ANTERIOR CERVICAL DECOMP/DISCECTOMY FUSION N/A 07/21/2012   Procedure: ANTERIOR CERVICAL DECOMPRESSION/DISCECTOMY FUSION 2 LEVELS;  Surgeon: Victory Gens, MD;  Location: [facility];  Service: Neurosurgery;  Laterality: N/A;  C4-5 C5-6 Anterior cervical decompression/diskectomy/fusion   ANTERIOR LAT LUMBAR FUSION N/A 04/07/2017   Procedure: Thoracic twelve-Lumbar one Anterolateral decompression/fusion;  Surgeon: Gens Victory, MD;  Location: [facility] OR;  Service: Neurosurgery;  Laterality: N/A;   ARTHRODESIS FOOT WITH WEIL OSTEOTOMY Left 11/07/2022   Procedure: LEFT THIRD METATARSOPHALANGEAL CAPSULOTOMY, WEIL OSTEOTOMY;  Surgeon: Elsa Lonni SAUNDERS, MD;  Location: [facility] OR;  Service: Orthopedics;  Laterality: Left;  LENGTH OF SURGERY: 60 MINUTES   BACK SURGERY     x 3   BELOW KNEE LEG AMPUTATION Right    June 2025   BONE EXOSTOSIS EXCISION Right 10/16/2023   Procedure: EXCISION, EXOSTOSIS;  Surgeon: Janit Thresa HERO, DPM;  Location: [facility] ORS;  Service: Orthopedics/Podiatry;  Laterality: Right;   CARDIAC CATHETERIZATION  11/2008   Dr. Jeffrie - 20% calcified non flow limiting left main, 50% EF apical hypokinesis   CARPAL TUNNEL RELEASE Bilateral    CERVICAL DISC ARTHROPLASTY N/A 04/07/2017   Procedure: Cervical six-seven Disc arthroplasty;  Surgeon: Gens Victory, MD;  Location: [facility] OR;  Service: Neurosurgery;  Laterality:  N/A;   CHEST TUBE INSERTION Left 04/11/2017   Procedure: CHEST TUBE INSERTION;  Surgeon: Fleeta Hanford Coy, MD;  Location: [facility] OR;  Service: Thoracic;  Laterality: Left;   CHOLECYSTECTOMY     COLONOSCOPY  2016   CG-MAC-miralax (good)servere TICS/TA x 2   ESOPHAGEAL MANOMETRY N/A 05/30/2021   Procedure: ESOPHAGEAL MANOMETRY (EM);  Surgeon: San Sandor GAILS, DO;  Location: [facility];  Service: Gastroenterology;  Laterality: N/A;  ph impedence   ESOPHAGOGASTRODUODENOSCOPY     EYE SURGERY Bilateral 2011   steroidal encapsulation (04/28/2012)   FOOT SURGERY Right    x 2   JOINT REPLACEMENT     Many surgeries including 5 joit replacements   KNEE ARTHROSCOPY Bilateral    LATERAL / POSTERIOR COMBINED FUSION LUMBAR SPINE  2012   LEFT ATRIAL APPENDAGE OCCLUSION  N/A 02/22/2021   Procedure: LEFT ATRIAL APPENDAGE OCCLUSION;  Surgeon: Cindie Ole DASEN, MD;  Location: [facility] INVASIVE CV LAB;  Service: Cardiovascular;  Laterality: N/A;   NEUROPLASTY / TRANSPOSITION ULNAR NERVE AT ELBOW Bilateral    POLYPECTOMY  2016   severe TICS/TA x 2   POSTERIOR CERVICAL LAMINECTOMY WITH MET- RX Right 06/11/2021   Procedure: Right Cervical six-seven  Laminectomy and foraminotomy with metrex;  Surgeon: Colon Shove, MD;  Location: [facility] OR;  Service: Neurosurgery;  Laterality: Right;   REFRACTIVE SURGERY Left 07/07/2023   laser surgery but still has floaters   REVERSE SHOULDER ARTHROPLASTY  04/28/2012   Procedure: REVERSE SHOULDER ARTHROPLASTY;  Surgeon: Eva Elsie Herring, MD;  Location: [facility] OR;  Service: Orthopedics;  Laterality: Left;  Left shouder reverse total shoulder arthroplasty   SHOULDER SURGERY     SPINE SURGERY     I have had 7 spinal surgeries   TEE WITHOUT CARDIOVERSION N/A 02/22/2021   Procedure: TRANSESOPHAGEAL ECHOCARDIOGRAM (TEE);  Surgeon: Cindie Ole DASEN, MD;  Location: [facility] INVASIVE CV LAB;  Service: Cardiovascular;  Laterality: N/A;   TENDON REPAIR Right 08/07/2020   Procedure: RIGHT  HAND LIGAMENT RECONSTRUCTION AND TENDON INTERPOSITION;  Surgeon: Yvone Rush, MD;  Location: [facility];  Service: Orthopedics;  Laterality: Right;   TONSILLECTOMY AND ADENOIDECTOMY  1954   TOTAL HIP ARTHROPLASTY Left 2011   left (04/28/2012)   TOTAL HIP ARTHROPLASTY Right 07/31/2015   Procedure: TOTAL HIP ARTHROPLASTY ANTERIOR APPROACH;  Surgeon: Rush Yvone, MD;  Location: [facility] OR;  Service: Orthopedics;  Laterality: Right;   TOTAL KNEE ARTHROPLASTY  01/10/2012   Procedure: TOTAL KNEE ARTHROPLASTY;  Surgeon: Rush LITTIE Yvone, MD;  Location: [facility] OR;  Service: Orthopedics;;  left total knee arthroplasty   TOTAL KNEE ARTHROPLASTY Right 06/17/2014   Procedure: TOTAL KNEE ARTHROPLASTY;  Surgeon: Rush LITTIE Yvone, MD;  Location: [facility] OR;  Service: Orthopedics;  Laterality: Right;   toupet fundolplication  11/01/2021   TRANSESOPHAGEAL ECHOCARDIOGRAM (CATH LAB) N/A 03/24/2023   Procedure: TRANSESOPHAGEAL ECHOCARDIOGRAM;  Surgeon: Mona Vinie BROCKS, MD;  Location: [facility] INVASIVE CV LAB;  Service: Cardiovascular;  Laterality: N/A;       Home Medications    Prior to Admission medications   Medication Sig Start Date End Date Taking? Authorizing Provider  acetaminophen  (TYLENOL ) 325 MG tablet Take 1-2 tablets (325-650 mg total) by mouth every 6 (six) hours as needed for mild pain (pain score 1-3) (or temp > 100.5). 12/05/23  Yes Love, Sharlet RAMAN, PA-C  albuterol  (VENTOLIN  HFA) 108 (90 Base) MCG/ACT inhaler INHALE 2 PUFFS BY MOUTH EVERY 4 HOURS AS NEEDED FOR WHEEZE OR FOR SHORTNESS OF BREATH 01/19/24  Yes Young, Reggy D, MD  amoxicillin -clavulanate (AUGMENTIN ) 875-125 MG tablet Take 1 tablet by mouth every 12 (twelve) hours. 02/02/24  Yes Maranda Jamee Jacob, MD  apixaban  (ELIQUIS ) 5 MG TABS tablet Take 1 tablet (5 mg total) by mouth 2 (two) times daily. 12/05/23  Yes Love, Sharlet RAMAN, PA-C  atorvastatin  (LIPITOR) 20 MG tablet Take 1 tablet (20 mg total) by mouth daily. 12/03/23  Yes Jeffrie Oneil BROCKS, MD   cyclobenzaprine  (FLEXERIL ) 10 MG tablet TAKE 1 TABLET BY MOUTH THREE TIMES A DAY AS NEEDED FOR MUSCLE SPASMS 12/31/23  Yes Curtis Debby PARAS, MD  diltiazem  (CARDIZEM  CD) 180 MG 24 hr capsule Take 1 capsule (180 mg total) by mouth daily. 08/25/23  Yes Jeffrie Oneil BROCKS, MD  DULoxetine  (CYMBALTA ) 30 MG capsule TAKE 1 CAPSULE BY MOUTH EVERY DAY 01/06/24  Yes Curtis Debby PARAS, MD  ferrous gluconate  (FERGON) 324 MG tablet Take 1 tablet (324 mg total) by mouth daily. 12/05/23  Yes Love, Sharlet RAMAN, PA-C  furosemide  (LASIX ) 40 MG tablet Take 0.5 tablets (20 mg total) by mouth daily as needed for edema. 12/29/23  Yes Franchot Novel, MD  hydrocerin (EUCERIN) CREA Apply 1 Application topically daily. To left foot Patient taking differently: Apply 1 Application topically daily. To left foot  - Wife reports patient switched Nivea 12/05/23  Yes Love, Pamela S, PA-C  HYDROcodone -acetaminophen  (NORCO) 10-325 MG tablet Take 1 tablet by mouth every 8 (eight) hours as needed for up to 5 days. 01/29/24 02/03/24 Yes Crain, Whitney L, PA  ipratropium-albuterol  (DUONEB) 0.5-2.5 (3) MG/3ML SOLN 1 NEBULE EVERY 6 HOURS AS NEEDED. **J45.3** 03/22/20  Yes Young, Reggy D, MD  isosorbide  mononitrate (IMDUR ) 30 MG 24 hr tablet Take 1 tablet (30 mg total) by mouth daily. 12/18/23  Yes Jeffrie Oneil BROCKS, MD  melatonin 5 MG TABS Take 1 tablet (5 mg total) by mouth at bedtime as needed. 12/05/23  Yes Love, Sharlet RAMAN, PA-C  Multiple Vitamin (MULTIVITAMIN WITH MINERALS) TABS tablet Take 1 tablet by mouth daily. 12/05/23  Yes Love, Sharlet RAMAN, PA-C  Olodaterol HCl (STRIVERDI RESPIMAT) 2.5 MCG/ACT AERS INHALE 2 PUFFS BY MOUTH INTO THE LUNGS DAILY 03/10/23  Yes Neysa Reggy D, MD  pantoprazole  (PROTONIX ) 40 MG tablet Take 1 tablet (40 mg total) by mouth 2 (two) times daily before a meal. 12/05/23  Yes Love, Sharlet RAMAN, PA-C  predniSONE  (DELTASONE ) 5 MG tablet 3 tabs daily 12/25/23  Yes Young, Reggy D, MD  pregabalin  (LYRICA ) 150 MG capsule Take 1  capsule (150 mg total) by mouth 2 (two) times daily. 01/15/24  Yes Urbano Albright, MD  tamsulosin  (FLOMAX ) 0.4 MG CAPS capsule Take 1 capsule (0.4 mg total) by mouth at bedtime. 12/05/23  Yes Love, Sharlet RAMAN, PA-C  feeding supplement (ENSURE PLUS HIGH PROTEIN) LIQD Take 237 mLs by mouth 2 (two) times daily between meals. Patient not taking: Reported on 12/30/2023 11/16/23   Raenelle Donalda HERO, MD    Family History Family History  Problem Relation Age of Onset   Diabetes Mother    Heart disease Father        Died 42, MI   Cancer Father    Hyperlipidemia Father    Stroke Father    Prostate cancer Brother    Colon polyps Brother    Diabetes Brother    Colon polyps Brother    Asthma Brother    COPD Brother    Colon cancer Brother        Dx age 80   Cancer Brother    Colon cancer Other    Esophageal cancer Neg Hx    Rectal cancer Neg Hx    Stomach cancer Neg Hx    Pancreatic cancer Neg Hx    Kidney disease Neg Hx    Liver disease Neg Hx    Neuropathy Neg Hx     Social History Social History   Tobacco Use   Smoking status: Never   Smokeless tobacco: Never  Vaping Use   Vaping status: Never Used  Substance Use Topics   Alcohol use: No   Drug use: No     Allergies   Levaquin  [levofloxacin ]   Review of Systems Review of Systems See HPI  Physical Exam Triage Vital Signs ED Triage Vitals  Encounter Vitals Group     BP 02/02/24 1113 127/77  Girls Systolic BP Percentile --      Girls Diastolic BP Percentile --      Boys Systolic BP Percentile --      Boys Diastolic BP Percentile --      Pulse Rate 02/02/24 1113 95     Resp 02/02/24 1113 16     Temp 02/02/24 1113 97.9 F (36.6 C)     Temp Source 02/02/24 1113 Oral     SpO2 02/02/24 1113 94 %     Weight --      Height --      Head Circumference --      Peak Flow --      Pain Score 02/02/24 1145 0     Pain Loc --      Pain Education --      Exclude from Growth Chart --    No data found.  Updated Vital  Signs BP 127/77 (BP Location: Right Arm)   Pulse 95   Temp 97.9 F (36.6 C) (Oral)   Resp 16   SpO2 94%      Physical Exam Constitutional:      General: He is not in acute distress.    Appearance: He is well-developed and normal weight.     Comments: In wheelchair.  Right BKA  HENT:     Head: Normocephalic and atraumatic.     Right Ear: Tympanic membrane and ear canal normal.     Left Ear: Ear canal normal.     Ears:     Comments: Left TM with cerumen occluding.  After irrigation the TM does appear to be dull with some erythema in the periphery    Nose: Congestion present.     Mouth/Throat:     Mouth: Mucous membranes are dry.     Pharynx: No posterior oropharyngeal erythema.  Eyes:     Conjunctiva/sclera: Conjunctivae normal.     Pupils: Pupils are equal, round, and reactive to light.  Cardiovascular:     Rate and Rhythm: Normal rate.  Pulmonary:     Effort: Pulmonary effort is normal. No respiratory distress.  Abdominal:     General: There is no distension.     Palpations: Abdomen is soft.  Musculoskeletal:        General: Normal range of motion.     Cervical back: Normal range of motion.  Skin:    General: Skin is warm and dry.  Neurological:     Mental Status: He is alert.      UC Treatments / Results  Labs (all labs ordered are listed, but only abnormal results are displayed) Labs Reviewed - No data to display  EKG   Radiology No results found.  Procedures Procedures (including critical care time)  Medications Ordered in UC Medications - No data to display  Initial Impression / Assessment and Plan / UC Course  I have reviewed the triage vital signs and the nursing notes.  Pertinent labs & imaging results that were available during my care of the patient were reviewed by me and considered in my medical decision making (see chart for details).     Final Clinical Impressions(s) / UC Diagnoses   Final diagnoses:  Impacted cerumen of left ear   Acute upper respiratory infection     Discharge Instructions      Try to increase your fluids.  Drink more water  Take antibiotic 2 times a day for 1 week Call or return for problems   ED Prescriptions  Medication Sig Dispense Auth. Provider   amoxicillin -clavulanate (AUGMENTIN ) 875-125 MG tablet Take 1 tablet by mouth every 12 (twelve) hours. 14 tablet Maranda Jamee Jacob, MD      PDMP not reviewed this encounter.   Maranda Jamee Jacob, MD 02/02/24 [phone]

## 2024-02-02 NOTE — ED Triage Notes (Signed)
 Patient c/o left ear feeling clogged x [redacted] week along with cough and congestion as well.  Denies any OTC cold meds.

## 2024-02-03 ENCOUNTER — Telehealth: Payer: Self-pay | Admitting: Physician Assistant

## 2024-02-03 ENCOUNTER — Telehealth: Payer: Self-pay

## 2024-02-03 DIAGNOSIS — I480 Paroxysmal atrial fibrillation: Secondary | ICD-10-CM | POA: Diagnosis not present

## 2024-02-03 DIAGNOSIS — J189 Pneumonia, unspecified organism: Secondary | ICD-10-CM | POA: Diagnosis not present

## 2024-02-03 DIAGNOSIS — J440 Chronic obstructive pulmonary disease with acute lower respiratory infection: Secondary | ICD-10-CM | POA: Diagnosis not present

## 2024-02-03 DIAGNOSIS — I5042 Chronic combined systolic (congestive) and diastolic (congestive) heart failure: Secondary | ICD-10-CM | POA: Diagnosis not present

## 2024-02-03 DIAGNOSIS — J439 Emphysema, unspecified: Secondary | ICD-10-CM | POA: Diagnosis not present

## 2024-02-03 DIAGNOSIS — I110 Hypertensive heart disease with heart failure: Secondary | ICD-10-CM | POA: Diagnosis not present

## 2024-02-03 NOTE — Telephone Encounter (Signed)
 Copied from CRM #[id number]. Topic: General - Other >> Feb 03, 2024  9:54 AM Hamdi H wrote: Reason for CRM: Lorie from [facility] called to notify patients provider who used to be Dr. ONEIDA, that the patient's pulse is 102, right shoulder 8/10 pain, right groin 7/10 pain with movement. Nothing urgent going on. Patient has a possible right shoulder surgery coming up.

## 2024-02-03 NOTE — Telephone Encounter (Signed)
 Forwarding message to Zada Palin, NP covering Dr. Curtis

## 2024-02-03 NOTE — Telephone Encounter (Signed)
 Patient called and said that he haven't heard nothing back yet to a video that was supposed to be seen by Dr. Addie about his shoulder. He wants to know what happened next. RA#[phone]

## 2024-02-03 NOTE — Telephone Encounter (Signed)
 Appt made with Dr. Addie for evaluation.  I spoke with patient's wife per his request.

## 2024-02-03 NOTE — Telephone Encounter (Signed)
 Copied from CRM [id number]. Topic: Clinical - Home Health Verbal Orders >> Feb 03, 2024 10:04 AM Rosaria LABOR wrote: Caller/Agency: Lorie with [facility] Callback Number: [phone] Service Requested: Physical Therapy Frequency: 1x9 Any new concerns about the patient? No  Please call Lorie with verbal orders. Ok to leave voice mail.

## 2024-02-05 DIAGNOSIS — M48061 Spinal stenosis, lumbar region without neurogenic claudication: Secondary | ICD-10-CM | POA: Diagnosis not present

## 2024-02-05 DIAGNOSIS — I110 Hypertensive heart disease with heart failure: Secondary | ICD-10-CM | POA: Diagnosis not present

## 2024-02-05 DIAGNOSIS — F54 Psychological and behavioral factors associated with disorders or diseases classified elsewhere: Secondary | ICD-10-CM | POA: Diagnosis not present

## 2024-02-05 DIAGNOSIS — J4540 Moderate persistent asthma, uncomplicated: Secondary | ICD-10-CM | POA: Diagnosis not present

## 2024-02-05 DIAGNOSIS — I82813 Embolism and thrombosis of superficial veins of lower extremities, bilateral: Secondary | ICD-10-CM | POA: Diagnosis not present

## 2024-02-05 DIAGNOSIS — M109 Gout, unspecified: Secondary | ICD-10-CM | POA: Diagnosis not present

## 2024-02-05 DIAGNOSIS — N39498 Other specified urinary incontinence: Secondary | ICD-10-CM | POA: Diagnosis not present

## 2024-02-05 DIAGNOSIS — J439 Emphysema, unspecified: Secondary | ICD-10-CM | POA: Diagnosis not present

## 2024-02-05 DIAGNOSIS — E785 Hyperlipidemia, unspecified: Secondary | ICD-10-CM | POA: Diagnosis not present

## 2024-02-05 DIAGNOSIS — M519 Unspecified thoracic, thoracolumbar and lumbosacral intervertebral disc disorder: Secondary | ICD-10-CM | POA: Diagnosis not present

## 2024-02-05 DIAGNOSIS — G629 Polyneuropathy, unspecified: Secondary | ICD-10-CM | POA: Diagnosis not present

## 2024-02-05 DIAGNOSIS — I872 Venous insufficiency (chronic) (peripheral): Secondary | ICD-10-CM | POA: Diagnosis not present

## 2024-02-05 DIAGNOSIS — Z89422 Acquired absence of other left toe(s): Secondary | ICD-10-CM | POA: Diagnosis not present

## 2024-02-05 DIAGNOSIS — M810 Age-related osteoporosis without current pathological fracture: Secondary | ICD-10-CM | POA: Diagnosis not present

## 2024-02-05 DIAGNOSIS — N401 Enlarged prostate with lower urinary tract symptoms: Secondary | ICD-10-CM | POA: Diagnosis not present

## 2024-02-05 DIAGNOSIS — K219 Gastro-esophageal reflux disease without esophagitis: Secondary | ICD-10-CM | POA: Diagnosis not present

## 2024-02-05 DIAGNOSIS — I480 Paroxysmal atrial fibrillation: Secondary | ICD-10-CM | POA: Diagnosis not present

## 2024-02-05 DIAGNOSIS — J189 Pneumonia, unspecified organism: Secondary | ICD-10-CM | POA: Diagnosis not present

## 2024-02-05 DIAGNOSIS — D509 Iron deficiency anemia, unspecified: Secondary | ICD-10-CM | POA: Diagnosis not present

## 2024-02-05 DIAGNOSIS — E039 Hypothyroidism, unspecified: Secondary | ICD-10-CM | POA: Diagnosis not present

## 2024-02-05 DIAGNOSIS — Z89511 Acquired absence of right leg below knee: Secondary | ICD-10-CM | POA: Diagnosis not present

## 2024-02-05 DIAGNOSIS — E538 Deficiency of other specified B group vitamins: Secondary | ICD-10-CM | POA: Diagnosis not present

## 2024-02-05 DIAGNOSIS — K5900 Constipation, unspecified: Secondary | ICD-10-CM | POA: Diagnosis not present

## 2024-02-05 DIAGNOSIS — J440 Chronic obstructive pulmonary disease with acute lower respiratory infection: Secondary | ICD-10-CM | POA: Diagnosis not present

## 2024-02-05 DIAGNOSIS — I5042 Chronic combined systolic (congestive) and diastolic (congestive) heart failure: Secondary | ICD-10-CM | POA: Diagnosis not present

## 2024-02-05 NOTE — Telephone Encounter (Signed)
 Spoke with lorie with [name].   States patient has upcoming appt on Monday 09/152025 for TOC from Dr. ONEIDA.  She states she was just required to report the patient pain level to PCP  States that he was seen at [facility] for groin pain  previously and no fractures seen  And the shoulder pain is being followed by surgeon who did right knee amputation.  Should I contact the patient to inquire further?

## 2024-02-05 NOTE — Telephone Encounter (Signed)
 Spoke with [name]  Given verbal approval only until seen  to est with new PCP which is scheduled on Monday 02/09/2024

## 2024-02-06 ENCOUNTER — Telehealth: Payer: Self-pay

## 2024-02-06 NOTE — Telephone Encounter (Signed)
 Okay for verbal order

## 2024-02-06 NOTE — Telephone Encounter (Signed)
 Forwarding message to Dr. Alvan covering Benton crain

## 2024-02-06 NOTE — Telephone Encounter (Signed)
 Copied from CRM [id number]. Topic: Clinical - Home Health Verbal Orders >> Feb 03, 2024 10:04 AM Rosaria LABOR wrote: Caller/Agency: Lorie with [facility] Callback Number: [phone] Service Requested: Physical Therapy Frequency: 1x9 Any new concerns about the patient? No  Please call Lorie with verbal orders. Ok to leave voice mail. >> Feb 06, 2024  9:00 AM Graeme ORN wrote: Lorie called back to update order - received verbal but needs to change from 1 week 9 to 1 week 8 starting next week. Can leave message - secure.

## 2024-02-09 ENCOUNTER — Ambulatory Visit (INDEPENDENT_AMBULATORY_CARE_PROVIDER_SITE_OTHER): Admitting: Student in an Organized Health Care Education/Training Program

## 2024-02-09 ENCOUNTER — Encounter: Payer: Self-pay | Admitting: Student in an Organized Health Care Education/Training Program

## 2024-02-09 ENCOUNTER — Telehealth: Payer: Self-pay

## 2024-02-09 VITALS — BP 116/72 | HR 92

## 2024-02-09 DIAGNOSIS — J438 Other emphysema: Secondary | ICD-10-CM | POA: Diagnosis not present

## 2024-02-09 DIAGNOSIS — Z89511 Acquired absence of right leg below knee: Secondary | ICD-10-CM

## 2024-02-09 DIAGNOSIS — I5022 Chronic systolic (congestive) heart failure: Secondary | ICD-10-CM

## 2024-02-09 DIAGNOSIS — G6289 Other specified polyneuropathies: Secondary | ICD-10-CM

## 2024-02-09 DIAGNOSIS — I480 Paroxysmal atrial fibrillation: Secondary | ICD-10-CM

## 2024-02-09 DIAGNOSIS — M19011 Primary osteoarthritis, right shoulder: Secondary | ICD-10-CM | POA: Diagnosis not present

## 2024-02-09 NOTE — Assessment & Plan Note (Signed)
 Status post right below-knee amputation with recent leg edema, likely due to changes in shrinker use. Prosthetic fitting is scheduled for October 10th.  I think he would benefit from more home physical therapy as requested by [name].  Mobility is going to be limited by his right shoulder rotator cuff disease which will need to be strengthened simultaneously as his core and leg strength.  Hopefully together this will improve his safety and transfers.  Monitor leg edema and adjust shrinker use as needed. Proceed with prosthetic fitting as planned.

## 2024-02-09 NOTE — Assessment & Plan Note (Signed)
 Chronic peripheral neuropathy with unclear etiology is managed with duloxetine  and Lyrica , but no significant relief is reported. Continue current neuropathy medications, comanaged with neurology.  Unfortunately this neuropathy seems to have led to the Charcot deformity that caused the loss of his right foot.

## 2024-02-09 NOTE — Assessment & Plan Note (Signed)
 AFib is managed with apixaban  and diltiazem . No history of stroke or heart attack. Unsuccessful Watchman device placement. Occasional AFib episodes are reported by the cardiologist. Continue apixaban  for anticoagulation. Maintain regular follow-up with cardiologist Dr. Jeffrie.  Currently well medically managed.  No aspirin .

## 2024-02-09 NOTE — Assessment & Plan Note (Signed)
 Chronic rotator cuff tear with severe dysfunction requires reverse total shoulder replacement. Shoulder function is critical for balance and crutch use during rehabilitation. Attend appointment with Dr. Addie on September 24th for shoulder evaluation.  Consider timing of shoulder surgery with prosthetic fitting and improved mobility.

## 2024-02-09 NOTE — Progress Notes (Signed)
 New Patient Office Visit  Subjective    Patient ID: Levi [name], male    DOB: Mar 02, 1946  Age: 78 y.o. MRN: [id number]  CC:   Chief Complaint  Patient presents with   Transitions Of Care    OLD PCP left       HPI  Discussed the use of AI scribe software for clinical note transcription with the patient, who gave verbal consent to proceed.  History of Present Illness Levi [name] is a 78 year old male who presents for establishment of care and management of ongoing medical needs.  He underwent a right below-knee amputation in June due to osteomyelitis and was readmitted in August with a fever, treated for a skin infection. Since returning home on July 11th, he has been receiving occupational and physical therapy. He is experiencing difficulties with transfers due to shoulder pain and has noticed swelling in his legs over the past few days. He uses a shrinker daily and is expected to receive a prosthetic by October 10th.  He has a history of shoulder issues, specifically with the right rotator cuff, and has had multiple surgeries on his shoulders in the past. He is experiencing increased pain and decreased mobility, affecting his ability to perform transfers at home.  He has gained significant weight, increasing from 156 pounds to approximately 190 pounds, which he attributes to being less active and snacking more at home. He has been on prednisone  for 15 years for lung issues, which he believes contributes to his weight gain.  He has a history of COPD and asthma, for which he uses albuterol , Striverdi, and Qvar  inhalers. He reports using albuterol  more frequently recently. He is also on apixaban  for atrial fibrillation and has not experienced a heart attack or stroke, although there is a history of a blood pool on his brain from many years ago.  He has neuropathy, for which he takes duloxetine  and Lyrica , though these medications do not significantly alleviate his symptoms.  He also has a history of prediabetes, although he has not been diagnosed with diabetes. His family history includes diabetes, which affected his mother and brother.  He is currently taking amoxicillin -clavulanate for an upper respiratory infection, which he is completing today. He reports no issues with eating or drinking, although he acknowledges overeating. He has not been using Lasix  recently, as his leg swelling had previously resolved but has now returned.    Outpatient Encounter Medications as of 02/09/2024  Medication Sig   albuterol  (VENTOLIN  HFA) 108 (90 Base) MCG/ACT inhaler INHALE 2 PUFFS BY MOUTH EVERY 4 HOURS AS NEEDED FOR WHEEZE OR FOR SHORTNESS OF BREATH   apixaban  (ELIQUIS ) 5 MG TABS tablet Take 1 tablet (5 mg total) by mouth 2 (two) times daily.   atorvastatin  (LIPITOR) 20 MG tablet Take 1 tablet (20 mg total) by mouth daily.   cyclobenzaprine  (FLEXERIL ) 10 MG tablet TAKE 1 TABLET BY MOUTH THREE TIMES A DAY AS NEEDED FOR MUSCLE SPASMS   diltiazem  (CARDIZEM  CD) 180 MG 24 hr capsule Take 1 capsule (180 mg total) by mouth daily.   DULoxetine  (CYMBALTA ) 30 MG capsule TAKE 1 CAPSULE BY MOUTH EVERY DAY   ferrous gluconate  (FERGON) 324 MG tablet Take 1 tablet (324 mg total) by mouth daily.   ipratropium-albuterol  (DUONEB) 0.5-2.5 (3) MG/3ML SOLN 1 NEBULE EVERY 6 HOURS AS NEEDED. **J45.3**   isosorbide  mononitrate (IMDUR ) 30 MG 24 hr tablet Take 1 tablet (30 mg total) by mouth daily.   melatonin  5 MG TABS Take 1 tablet (5 mg total) by mouth at bedtime as needed.   Olodaterol HCl (STRIVERDI RESPIMAT) 2.5 MCG/ACT AERS INHALE 2 PUFFS BY MOUTH INTO THE LUNGS DAILY   pantoprazole  (PROTONIX ) 40 MG tablet Take 1 tablet (40 mg total) by mouth 2 (two) times daily before a meal.   predniSONE  (DELTASONE ) 5 MG tablet 3 tabs daily   pregabalin  (LYRICA ) 150 MG capsule Take 1 capsule (150 mg total) by mouth 2 (two) times daily.   tamsulosin  (FLOMAX ) 0.4 MG CAPS capsule Take 1 capsule (0.4 mg total) by  mouth at bedtime.   [DISCONTINUED] acetaminophen  (TYLENOL ) 325 MG tablet Take 1-2 tablets (325-650 mg total) by mouth every 6 (six) hours as needed for mild pain (pain score 1-3) (or temp > 100.5).   [DISCONTINUED] furosemide  (LASIX ) 40 MG tablet Take 0.5 tablets (20 mg total) by mouth daily as needed for edema.   [DISCONTINUED] Multiple Vitamin (MULTIVITAMIN WITH MINERALS) TABS tablet Take 1 tablet by mouth daily.   [DISCONTINUED] amoxicillin -clavulanate (AUGMENTIN ) 875-125 MG tablet Take 1 tablet by mouth every 12 (twelve) hours.   [DISCONTINUED] feeding supplement (ENSURE PLUS HIGH PROTEIN) LIQD Take 237 mLs by mouth 2 (two) times daily between meals. (Patient not taking: Reported on 12/30/2023)   [DISCONTINUED] hydrocerin (EUCERIN) CREA Apply 1 Application topically daily. To left foot (Patient taking differently: Apply 1 Application topically daily. To left foot  - Wife reports patient switched Nivea)   No facility-administered encounter medications on file as of 02/09/2024.    Past Medical History:  Diagnosis Date   Agent orange exposure 1970's   takes Imdur ,Diltiazem , and Atacand  daily   Agent orange exposure    ALLERGIC RHINITIS    Allergy     seasonal allergies   Anemia    iron  deficiency   Arthritis    Asthma    uses inhaler and nebulizers   Atrial fibrillation (HCC)    Bruises easily    d/t prednisone  daily   Cataract    Cellulitis 03/22/2020   Chest wall pain 09/21/2019   L scapula, reported 4/27 21/     CHF (congestive heart failure) (HCC)    dx-on meds to treat   Chronic bronchitis (HCC)    used to get it q yr; last time ?2008 (04/28/2012)   Clotting disorder (HCC) 2019   On blood thinner when I bleed it is for a consideral amount of time. This is tegardless of size of wound.   COPD (chronic obstructive pulmonary disease) (HCC)    agent orange exposure   Degenerative disk disease    everywhere (04/28/2012)   Depression due to physical illness 12/15/2023    Diverticulitis    Dysrhythmia    afib   Elevated uric acid in blood    takes Allopurinol  daily   Emphysema of lung (HCC)    Enlarged prostate    but not on any meds   Essential (primary) hypertension 11/15/2020   GERD (gastroesophageal reflux disease)    takes Protonix  daily   H/O hiatal hernia    Headache    History of colonic polyps 02/09/2013   01/2013 - 3 adenomas max 8 mm  01/2015 2 diminutive adenomas - repeat colon 2021     History of colonoscopy    History of pneumonia    a. 2010   History of total right hip arthroplasty 07/31/2015   Hyperlipidemia    takes Lipitor daily; pt. states he takes a preventive   Hypothyroidism    Joint  pain    Joint swelling    Leukocytosis    Lumbar stenosis status post T12-S1 fusion 04/07/2017   MSSA bacteremia 03/19/2023   Nausea and vomiting 11/12/2022   Neuromuscular disorder (HCC)    bilat legs, and bilat arms   Neuropathy    Osteoarthritis of left knee 01/10/2012   Osteoporosis 2012   PAF (paroxysmal atrial fibrillation) (HCC)    a. Dx 04/2012, CHA2DS2VASc = 1 (age);  b. 04/2012 Echo: EF 40-50%, mild MR.   Personal history of colonic adenomas 02/09/2013   Pneumonia    Primary osteoarthritis of right knee 06/17/2014   Sepsis (HCC) 03/18/2023   Shortness of breath dyspnea    Thyroid  disease    no taking meds-caused dizziness    Past Surgical History:  Procedure Laterality Date   80 HOUR PH STUDY N/A 05/30/2021   Procedure: 24 HOUR PH STUDY;  Surgeon: San Sandor GAILS, DO;  Location: [facility];  Service: Gastroenterology;  Laterality: N/A;   AMPUTATION Right 11/14/2023   Procedure: AMPUTATION BELOW KNEE;  Surgeon: Harden Jerona GAILS, MD;  Location: [facility] OR;  Service: Orthopedics;  Laterality: Right;   AMPUTATION TOE Bilateral 08/13/2023   Procedure: LEFT SECOND TOE AMPUTATION AND RIGHT FIFTH TOE AMPUTATION;  Surgeon: Malvin Marsa FALCON, DPM;  Location: [facility];  Service: Orthopedics/Podiatry;  Laterality: Bilateral;    ANTERIOR CERVICAL DECOMP/DISCECTOMY FUSION N/A 07/21/2012   Procedure: ANTERIOR CERVICAL DECOMPRESSION/DISCECTOMY FUSION 2 LEVELS;  Surgeon: Victory Gens, MD;  Location: [facility];  Service: Neurosurgery;  Laterality: N/A;  C4-5 C5-6 Anterior cervical decompression/diskectomy/fusion   ANTERIOR LAT LUMBAR FUSION N/A 04/07/2017   Procedure: Thoracic twelve-Lumbar one Anterolateral decompression/fusion;  Surgeon: Gens Victory, MD;  Location: [facility];  Service: Neurosurgery;  Laterality: N/A;   ARTHRODESIS FOOT WITH WEIL OSTEOTOMY Left 11/07/2022   Procedure: LEFT THIRD METATARSOPHALANGEAL CAPSULOTOMY, WEIL OSTEOTOMY;  Surgeon: Elsa Lonni SAUNDERS, MD;  Location: [facility] OR;  Service: Orthopedics;  Laterality: Left;  LENGTH OF SURGERY: 60 MINUTES   BACK SURGERY     x 3   BELOW KNEE LEG AMPUTATION Right    June 2025   BONE EXOSTOSIS EXCISION Right 10/16/2023   Procedure: EXCISION, EXOSTOSIS;  Surgeon: Janit Thresa HERO, DPM;  Location: [facility];  Service: Orthopedics/Podiatry;  Laterality: Right;   CARDIAC CATHETERIZATION  11/2008   Dr. Jeffrie - 20% calcified non flow limiting left main, 50% EF apical hypokinesis   CARPAL TUNNEL RELEASE Bilateral    CERVICAL DISC ARTHROPLASTY N/A 04/07/2017   Procedure: Cervical six-seven Disc arthroplasty;  Surgeon: Gens Victory, MD;  Location[facility] OR;  Service: Neurosurgery;  Laterality: N/A;   CHEST TUBE INSERTION Left 04/11/2017   Procedure: CHEST TUBE INSERTION;  Surgeon: Fleeta Hanford Coy, MD;  Location: [facility] OR;  Service: Thoracic;  Laterality: Left;   CHOLECYSTECTOMY     COLONOSCOPY  2016   CG-MAC-miralax (good)servere TICS/TA x 2   ESOPHAGEAL MANOMETRY N/A 05/30/2021   Procedure: ESOPHAGEAL MANOMETRY (EM);  Surgeon: San Sandor GAILS, DO;  Location: [facility];  Service: Gastroenterology;  Laterality: N/A;  ph impedence   ESOPHAGOGASTRODUODENOSCOPY     EYE SURGERY Bilateral 2011   steroidal encapsulation (04/28/2012)   FOOT SURGERY Right    x 2   JOINT  REPLACEMENT     Many surgeries including 5 joit replacements   KNEE ARTHROSCOPY Bilateral    LATERAL / POSTERIOR COMBINED FUSION LUMBAR SPINE  2012   LEFT ATRIAL APPENDAGE OCCLUSION N/A 02/22/2021   Procedure: LEFT ATRIAL APPENDAGE OCCLUSION;  Surgeon: Cindie,  Ole DASEN, MD;  [facility];  Service: Cardiovascular;  Laterality: N/A;   NEUROPLASTY / TRANSPOSITION ULNAR NERVE AT ELBOW Bilateral    POLYPECTOMY  2016   severe TICS/TA x 2   POSTERIOR CERVICAL LAMINECTOMY WITH MET- RX Right 06/11/2021   Procedure: Right Cervical six-seven  Laminectomy and foraminotomy with metrex;  Surgeon: Colon Shove, MD;  Location: [facility];  Service: Neurosurgery;  Laterality: Right;   REFRACTIVE SURGERY Left 07/07/2023   laser surgery but still has floaters   REVERSE SHOULDER ARTHROPLASTY  04/28/2012   Procedure: REVERSE SHOULDER ARTHROPLASTY;  Surgeon: Eva Elsie Herring, MD;  Location: [facility] OR;  Service: Orthopedics;  Laterality: Left;  Left shouder reverse total shoulder arthroplasty   SHOULDER SURGERY     SPINE SURGERY     I have had 7 spinal surgeries   TEE WITHOUT CARDIOVERSION N/A 02/22/2021   Procedure: TRANSESOPHAGEAL ECHOCARDIOGRAM (TEE);  Surgeon: Cindie Ole DASEN, MD;  Location: [facility] INVASIVE CV LAB;  Service: Cardiovascular;  Laterality: N/A;   TENDON REPAIR Right 08/07/2020   Procedure: RIGHT HAND LIGAMENT RECONSTRUCTION AND TENDON INTERPOSITION;  Surgeon: Yvone Rush, MD;  Location: [facility];  Service: Orthopedics;  Laterality: Right;   TONSILLECTOMY AND ADENOIDECTOMY  1954   TOTAL HIP ARTHROPLASTY Left 2011   left (04/28/2012)   TOTAL HIP ARTHROPLASTY Right 07/31/2015   Procedure: TOTAL HIP ARTHROPLASTY ANTERIOR APPROACH;  Surgeon: Rush Yvone, MD;  Location: [facility];  Service: Orthopedics;  Laterality: Right;   TOTAL KNEE ARTHROPLASTY  01/10/2012   Procedure: TOTAL KNEE ARTHROPLASTY;  Surgeon: Rush LITTIE Yvone, MD;  Location: [facility];  Service:  Orthopedics;;  left total knee arthroplasty   TOTAL KNEE ARTHROPLASTY Right 06/17/2014   Procedure: TOTAL KNEE ARTHROPLASTY;  Surgeon: Rush LITTIE Yvone, MD;  Location: [facility];  Service: Orthopedics;  Laterality: Right;   toupet fundolplication  11/01/2021   TRANSESOPHAGEAL ECHOCARDIOGRAM (CATH LAB) N/A 03/24/2023   Procedure: TRANSESOPHAGEAL ECHOCARDIOGRAM;  Surgeon: Mona Vinie BROCKS, MD;  [facility];  Service: Cardiovascular;  Laterality: N/A;    Family History  Problem Relation Age of Onset   Diabetes Mother    Heart disease Father        Died 10, MI   Cancer Father    Hyperlipidemia Father    Stroke Father    Prostate cancer Brother    Colon polyps Brother    Diabetes Brother    Colon polyps Brother    Asthma Brother    COPD Brother    Colon cancer Brother        Dx age 66   Cancer Brother    Colon cancer Other    Esophageal cancer Neg Hx    Rectal cancer Neg Hx    Stomach cancer Neg Hx    Pancreatic cancer Neg Hx    Kidney disease Neg Hx    Liver disease Neg Hx    Neuropathy Neg Hx          Objective    BP 116/72   Pulse 92   SpO2 97%   Physical Exam  Gen: Chronically ill-appearing man in a wheelchair Skin: Diffuse thinning of the skin with senile purpura across his arms Heart: Regular, no murmur Lungs: Unlabored, diffuse coarse rhonchorous sounds in both lungs, no wheezing, occasional coughing Abd: Soft, nontender Ext: Right below-knee amputation with a shrinker in place, left leg has a compression stocking in place with 1+ pitting edema, no rash or nonhealing wounds on  the left.  Right shoulder severely limited in abduction, unable to raise past 30 degrees.  Diffuse sarcopenia. Neuro: Alert, conversational, moves extremity spontaneously but very limited by diffuse osteoarthritis, nonambulatory Psych: Appropriate mood and affect, not anxious or depressed.     Assessment & Plan:     Problem List Items Addressed This Visit       High    Paroxysmal atrial fibrillation (HCC) (Chronic)   AFib is managed with apixaban  and diltiazem . No history of stroke or heart attack. Unsuccessful Watchman device placement. Occasional AFib episodes are reported by the cardiologist. Continue apixaban  for anticoagulation. Maintain regular follow-up with cardiologist Dr. Jeffrie.  Currently well medically managed.  No aspirin .      COPD (chronic obstructive pulmonary disease) (HCC) (Chronic)   COPD and asthma are managed with inhalers. Recent URI was treated with antibiotics. No recent exacerbations requiring hospitalization, but increased albuterol  use is noted. Continue current inhaler regimen with albuterol , Striverdi, and Qvar . Monitor respiratory symptoms and adjust treatment as needed.  Currently not using supplemental oxygen.  Going to establish care with Dr. Kassie with pulmonology.      Acquired absence of right leg below knee (HCC) (Chronic)   Status post right below-knee amputation with recent leg edema, likely due to changes in shrinker use. Prosthetic fitting is scheduled for October 10th.  I think he would benefit from more home physical therapy as requested by [name].  Mobility is going to be limited by his right shoulder rotator cuff disease which will need to be strengthened simultaneously as his core and leg strength.  Hopefully together this will improve his safety and transfers.  Monitor leg edema and adjust shrinker use as needed. Proceed with prosthetic fitting as planned.      Primary osteoarthritis, right shoulder - Primary (Chronic)   Chronic rotator cuff tear with severe dysfunction requires reverse total shoulder replacement. Shoulder function is critical for balance and crutch use during rehabilitation. Attend appointment with Dr. Addie on September 24th for shoulder evaluation.  Consider timing of shoulder surgery with prosthetic fitting and improved mobility.      Chronic systolic heart failure (HCC) (Chronic)      Medium    Peripheral neuropathy (Chronic)   Chronic peripheral neuropathy with unclear etiology is managed with duloxetine  and Lyrica , but no significant relief is reported. Continue current neuropathy medications, comanaged with neurology.  Unfortunately this neuropathy seems to have led to the Charcot deformity that caused the loss of his right foot.       Return in about 4 weeks (around 03/08/2024).   Cleatus Debby Specking, MD

## 2024-02-09 NOTE — Telephone Encounter (Signed)
 Copied from CRM [id number]. Topic: Clinical - Home Health Verbal Orders >> Feb 03, 2024 10:04 AM Rosaria LABOR wrote: Caller/Agency: Lorie with [facility] Callback Number: [phone] Service Requested: Physical Therapy Frequency: 1x9 Any new concerns about the patient? No  Please call Lorie with verbal orders. Ok to leave voice mail. >> Feb 09, 2024  8:44 AM Susanna ORN wrote: Cherlyn, PT, with [facility], calling to check if verbal orders were submitted. Called [facility] & spoke with nurse Luke and she spoke with Lorie. Warm transferred.  >> Feb 06, 2024  9:00 AM Fredrica W wrote: Lorie called back to update order - received verbal but needs to change from 1 week 9 to 1 week 8 starting next week. Can leave message - secure.

## 2024-02-09 NOTE — Telephone Encounter (Signed)
 Called Lorie,  Shows that now patient has established with a new PCP ;  Debby specking, MD  Asked if she will now need to request the orders from this provider.  Left a voice mail message as above and return contact information.

## 2024-02-09 NOTE — Telephone Encounter (Signed)
 Verbal orders given

## 2024-02-09 NOTE — Assessment & Plan Note (Signed)
 COPD and asthma are managed with inhalers. Recent URI was treated with antibiotics. No recent exacerbations requiring hospitalization, but increased albuterol  use is noted. Continue current inhaler regimen with albuterol , Striverdi, and Qvar . Monitor respiratory symptoms and adjust treatment as needed.  Currently not using supplemental oxygen.  Going to establish care with Dr. Kassie with pulmonology.

## 2024-02-09 NOTE — Patient Instructions (Signed)
  VISIT SUMMARY: You came in today to establish care and discuss your ongoing medical needs. We reviewed your recent right below-knee amputation, shoulder pain, weight gain, respiratory issues, atrial fibrillation, neuropathy, and skin concerns.  YOUR PLAN: -RIGHT BELOW-KNEE AMPUTATION WITH RECENT EDEMA AND PROSTHETIC PLANNING: You have swelling in your leg following your right below-knee amputation, likely due to changes in your shrinker use. We will continue with your prosthetic fitting scheduled for October 10th and monitor your leg swelling. Your home physical therapy will continue as planned.  -RIGHT SHOULDER COMPLETE ROTATOR CUFF TEAR WITH SEVERE DYSFUNCTION: Your chronic shoulder issue requires a reverse total shoulder replacement. This surgery is important for your balance and crutch use. You have an appointment with Dr. Addie on September 24th for further evaluation. We will delay the shoulder surgery until after you receive your prosthetic and your mobility improves.  -CHRONIC OBSTRUCTIVE PULMONARY DISEASE AND ASTHMA: Your COPD and asthma are being managed with your current inhalers. You recently had an upper respiratory infection treated with antibiotics. Continue using your inhalers as prescribed and monitor your respiratory symptoms.  -ATRIAL FIBRILLATION ON ANTICOAGULATION: Your atrial fibrillation is managed with apixaban . You have not had a stroke or heart attack, but you do have occasional episodes of AFib. Continue taking apixaban  and follow up regularly with your cardiologist, Dr. Ludie.  -PERIPHERAL NEUROPATHY: Your chronic neuropathy is being managed with duloxetine  and Lyrica , although these medications have not provided significant relief. Continue with your current medications.  -OBESITY: You have gained weight recently due to reduced mobility and increased snacking. We will focus on improving your mobility and discuss weight management strategies in future visits.  -SENILE  PURPURA AND SKIN FRAGILITY: Your easy bruising and skin changes are likely due to your long-term use of prednisone  and anticoagulation. This condition causes your skin to be more fragile and prone to bruising.  INSTRUCTIONS: Please attend your appointment with Dr. Addie on September 24th for your shoulder evaluation. Continue with your home physical therapy and monitor your leg swelling. Your prosthetic fitting is scheduled for October 10th. Maintain regular follow-ups with your cardiologist, Dr. Ludie, for your atrial fibrillation.

## 2024-02-10 ENCOUNTER — Telehealth: Payer: Self-pay | Admitting: *Deleted

## 2024-02-10 NOTE — Progress Notes (Signed)
 Complex Care Management Note  Care Guide Note 02/10/2024 Name: GIANLUCAS EVENSON MRN: [id number] DOB: 1946/02/17  [name] is a 78 y.o. year old male who sees Jerrell, Cleatus Ned, MD for primary care. I reached out to [name] by phone today to offer complex care management services.  Mr. Olander was given information about Complex Care Management services today including:   The Complex Care Management services include support from the care team which includes your Nurse Care Manager, Clinical Social Worker, or Pharmacist.  The Complex Care Management team is here to help remove barriers to the health concerns and goals most important to you. Complex Care Management services are voluntary, and the patient may decline or stop services at any time by request to their care team member.   Complex Care Management Consent Status: Patient agreed to services and verbal consent obtained.   Follow up plan:  Telephone appointment with complex care management team member scheduled for:  02/19/2024  Encounter Outcome:  Patient Scheduled  Thedford Franks, CMA [place]  [facility], [facility] Guide Direct Dial: [phone]  Fax: [phone] Website: [email or web address]

## 2024-02-11 DIAGNOSIS — J189 Pneumonia, unspecified organism: Secondary | ICD-10-CM | POA: Diagnosis not present

## 2024-02-11 DIAGNOSIS — J440 Chronic obstructive pulmonary disease with acute lower respiratory infection: Secondary | ICD-10-CM | POA: Diagnosis not present

## 2024-02-11 DIAGNOSIS — I110 Hypertensive heart disease with heart failure: Secondary | ICD-10-CM | POA: Diagnosis not present

## 2024-02-11 DIAGNOSIS — I480 Paroxysmal atrial fibrillation: Secondary | ICD-10-CM | POA: Diagnosis not present

## 2024-02-11 DIAGNOSIS — I5042 Chronic combined systolic (congestive) and diastolic (congestive) heart failure: Secondary | ICD-10-CM | POA: Diagnosis not present

## 2024-02-11 DIAGNOSIS — J439 Emphysema, unspecified: Secondary | ICD-10-CM | POA: Diagnosis not present

## 2024-02-12 DIAGNOSIS — I5042 Chronic combined systolic (congestive) and diastolic (congestive) heart failure: Secondary | ICD-10-CM | POA: Diagnosis not present

## 2024-02-12 DIAGNOSIS — I480 Paroxysmal atrial fibrillation: Secondary | ICD-10-CM | POA: Diagnosis not present

## 2024-02-12 DIAGNOSIS — J439 Emphysema, unspecified: Secondary | ICD-10-CM | POA: Diagnosis not present

## 2024-02-12 DIAGNOSIS — J440 Chronic obstructive pulmonary disease with acute lower respiratory infection: Secondary | ICD-10-CM | POA: Diagnosis not present

## 2024-02-12 DIAGNOSIS — I110 Hypertensive heart disease with heart failure: Secondary | ICD-10-CM | POA: Diagnosis not present

## 2024-02-12 DIAGNOSIS — J189 Pneumonia, unspecified organism: Secondary | ICD-10-CM | POA: Diagnosis not present

## 2024-02-13 DIAGNOSIS — I480 Paroxysmal atrial fibrillation: Secondary | ICD-10-CM | POA: Diagnosis not present

## 2024-02-13 DIAGNOSIS — I5042 Chronic combined systolic (congestive) and diastolic (congestive) heart failure: Secondary | ICD-10-CM | POA: Diagnosis not present

## 2024-02-13 DIAGNOSIS — J440 Chronic obstructive pulmonary disease with acute lower respiratory infection: Secondary | ICD-10-CM | POA: Diagnosis not present

## 2024-02-13 DIAGNOSIS — J189 Pneumonia, unspecified organism: Secondary | ICD-10-CM | POA: Diagnosis not present

## 2024-02-13 DIAGNOSIS — I110 Hypertensive heart disease with heart failure: Secondary | ICD-10-CM | POA: Diagnosis not present

## 2024-02-13 DIAGNOSIS — J439 Emphysema, unspecified: Secondary | ICD-10-CM | POA: Diagnosis not present

## 2024-02-16 ENCOUNTER — Telehealth: Payer: Self-pay | Admitting: Family Medicine

## 2024-02-16 ENCOUNTER — Encounter: Payer: Self-pay | Admitting: Family Medicine

## 2024-02-16 DIAGNOSIS — J439 Emphysema, unspecified: Secondary | ICD-10-CM | POA: Diagnosis not present

## 2024-02-16 DIAGNOSIS — I110 Hypertensive heart disease with heart failure: Secondary | ICD-10-CM | POA: Diagnosis not present

## 2024-02-16 DIAGNOSIS — J189 Pneumonia, unspecified organism: Secondary | ICD-10-CM | POA: Diagnosis not present

## 2024-02-16 DIAGNOSIS — J440 Chronic obstructive pulmonary disease with acute lower respiratory infection: Secondary | ICD-10-CM | POA: Diagnosis not present

## 2024-02-16 DIAGNOSIS — I480 Paroxysmal atrial fibrillation: Secondary | ICD-10-CM | POA: Diagnosis not present

## 2024-02-16 DIAGNOSIS — I5042 Chronic combined systolic (congestive) and diastolic (congestive) heart failure: Secondary | ICD-10-CM | POA: Diagnosis not present

## 2024-02-16 NOTE — Telephone Encounter (Signed)
 Patient was recently seen by Amy. If there are any new symptoms, I recommend he see PCP. If there are acute changes, he should go to the ER. Please inquire.

## 2024-02-16 NOTE — Telephone Encounter (Signed)
 Patient's wife, Keymani Mclean frequently drops things, can not open tops, and double vision. Would like a call back to discuss lack of strength in hands and earlier appointment.

## 2024-02-16 NOTE — Telephone Encounter (Signed)
 Given his complex history and complicated medical issues which may in part cause or exacerbate symptoms like weakness, his history of cellulitis, history of hospitalization last month for suspected pneumonia, I think the evaluation through PCP is imperative.  In addition, if they want, we can initiate a referral to a neuromuscular specialist at an academic center for his longstanding history of neuropathy.  He has had symptoms for years and unfortunately there is not a whole lot we can do for additional workup from our end.  I would be happy to make a referral to a neuromuscular specialist, please inquire if they would like to pursue this and if they would like to go to [facility] or [facility] in [place] or [facility].

## 2024-02-16 NOTE — Telephone Encounter (Signed)
 Spoke with patient's wife [name] and discussed Dr Obie message. She was very appreciative of the options presented and she will discuss with the patient. They will see pcp and discuss as well. If they decide to proceed with referral before they see pcp she will call us  back.

## 2024-02-16 NOTE — Telephone Encounter (Signed)
 error

## 2024-02-16 NOTE — Telephone Encounter (Signed)
 I spoke with pt's wife Levi [name] (on [place]) and discussed the recommendation from Dr Buck. [name] was disappointed in the recommendation. She said the pt's PCP left the practice and they are being setup with a new one. Has met pcp once so far for 15 min. I did explain that if they feel patient's symptoms are worsening acutely, we recommend ER so they can rule out any unrelated, more serious causes. She states the symptoms were present at the last visit (weakness, double vision). She said she feels his bil arm weakness is related to the neuropathy but is progressing more rapidly and in a hurry than she felt it should be for his condition. I then emphasized recommendation for ER but would also relay message back to Dr Buck and Amy since she expressed that she did not feel the ER would be helpful. She was appreciative.

## 2024-02-18 ENCOUNTER — Ambulatory Visit: Admitting: Orthopedic Surgery

## 2024-02-18 DIAGNOSIS — J189 Pneumonia, unspecified organism: Secondary | ICD-10-CM | POA: Diagnosis not present

## 2024-02-18 DIAGNOSIS — J440 Chronic obstructive pulmonary disease with acute lower respiratory infection: Secondary | ICD-10-CM | POA: Diagnosis not present

## 2024-02-18 DIAGNOSIS — G8929 Other chronic pain: Secondary | ICD-10-CM | POA: Diagnosis not present

## 2024-02-18 DIAGNOSIS — I480 Paroxysmal atrial fibrillation: Secondary | ICD-10-CM | POA: Diagnosis not present

## 2024-02-18 DIAGNOSIS — M25511 Pain in right shoulder: Secondary | ICD-10-CM

## 2024-02-18 DIAGNOSIS — I5042 Chronic combined systolic (congestive) and diastolic (congestive) heart failure: Secondary | ICD-10-CM | POA: Diagnosis not present

## 2024-02-18 DIAGNOSIS — J439 Emphysema, unspecified: Secondary | ICD-10-CM | POA: Diagnosis not present

## 2024-02-18 DIAGNOSIS — I110 Hypertensive heart disease with heart failure: Secondary | ICD-10-CM | POA: Diagnosis not present

## 2024-02-19 ENCOUNTER — Ambulatory Visit: Payer: Self-pay

## 2024-02-19 ENCOUNTER — Other Ambulatory Visit: Payer: Self-pay

## 2024-02-19 ENCOUNTER — Ambulatory Visit

## 2024-02-19 NOTE — Telephone Encounter (Signed)
 FYI Only or Action Required?: FYI only for provider.  Patient was last seen in primary care on 02/09/2024 by Jerrell Cleatus Ned, MD.  Called Nurse Triage reporting Extremity Weakness.  Symptoms began about a month ago.  Interventions attempted: OTC medications: Tylenol .  Symptoms are: gradually worsening.  Triage Disposition: See HCP Within 4 Hours (Or PCP Triage)  Patient/caregiver understands and will follow disposition?: Yes  FYI- Wife spoke with Neuro on 9/22 and was advised to follow-up with PCP for symptoms exacerbation. During the call patients wife advised that patient had a fever this morning. RN advising HCP within 4 hours to eval for underlying. Wife is agreeable, opting to go to UC in [place] vs [facility] PCP office. Wife also asking for 10/13 appt to be moved up.  No sooner appts avail, appt added to waitlist at this time.    Copied from CRM #[id number]. Topic: Clinical - Red Word Triage >> Feb 19, 2024  9:21 AM Robinson H wrote: [facility] that prompted transfer to Nurse Triage: Weakness lack of strength in both hands and arms increasing pretty quickly, cognitive issues lately, getting forgetful, dizziness Reason for Disposition  [1] Fever > 101 F (38.3 C) AND [2] age > 60 years  Answer Assessment - Initial Assessment Questions 1. DESCRIPTION: Describe how you are feeling.     Per wife, patient has bilateral weakness in hands and arms  2. SEVERITY: How bad is it?  Can you stand and walk?     Hx of BKA. Starting to have some pain and weakness to self transfer from bed to chair to bathroom.   3. ONSET: When did these symptoms begin? (e.g., hours, days, weeks, months)     Per wife seems like he has been getting weaker over the past month  4. CAUSE: What do you think is causing the weakness or fatigue? (e.g., not drinking enough fluids, medical problem, trouble sleeping)     Has hx of neuropathy   5. NEW MEDICINES:  Have you started on any new medicines  recently? (e.g., opioid pain medicines, benzodiazepines, muscle relaxants, antidepressants, antihistamines, neuroleptics, beta blockers)     No  6. OTHER SYMPTOMS: Do you have any other symptoms? (e.g., chest pain, fever, cough, SOB, vomiting, diarrhea, bleeding, other areas of pain)     Wife expresses concern for cognitive function. Worsening over the past 2 month. He has been more forgetful lately. Sometimes he is normal, but the frequency is increasing.  Protocols used: Weakness (Generalized) and Fatigue-A-AH

## 2024-02-20 ENCOUNTER — Ambulatory Visit: Admitting: Student in an Organized Health Care Education/Training Program

## 2024-02-20 ENCOUNTER — Encounter: Payer: Self-pay | Admitting: Student in an Organized Health Care Education/Training Program

## 2024-02-20 VITALS — BP 99/63 | HR 106 | Temp 98.3°F

## 2024-02-20 DIAGNOSIS — Z89511 Acquired absence of right leg below knee: Secondary | ICD-10-CM | POA: Diagnosis not present

## 2024-02-20 DIAGNOSIS — S31000A Unspecified open wound of lower back and pelvis without penetration into retroperitoneum, initial encounter: Secondary | ICD-10-CM

## 2024-02-20 DIAGNOSIS — G6289 Other specified polyneuropathies: Secondary | ICD-10-CM

## 2024-02-20 NOTE — Assessment & Plan Note (Signed)
 Stage 1 sacral pressure injury with redness and swelling from prolonged sitting. Risk of progression exists if not managed. Apply hydrocolloid dressings to protect from pressure. Order home health nursing to monitor the wound and check for skin breakdown. Instruct him to shift weight every hour to offload pressure.  He has a hospital bed at home.  Since discharge from acute inpatient rehab his conditioning has declined.  He is no longer able to transfer independently on his left leg.  He is at high risk for worsening of this sacral wound in the coming weeks.  We talked extensively about the importance of ongoing physical therapy, Occupational Therapy, and working on his transfers.  At risk for hospitalization.  At risk for nursing facility placement.

## 2024-02-20 NOTE — Assessment & Plan Note (Signed)
 Generalized weakness and functional decline due to shoulder issues, neuropathy, and lack of mobility. Concerns about home management and potential need for rehabilitation. Encouraged physical and occupational therapy to improve strength and mobility. Consider alternative seating to facilitate transfers and reduce sacral pressure. Evaluate the need for additional support or rehabilitation if no progress.  Will refer to PMR for complex rehab needs and accommodations.  Unfortunately he has declined after his inpatient rehab treatment.

## 2024-02-20 NOTE — Patient Instructions (Signed)
  VISIT SUMMARY: During today's visit, we discussed the new lump on your backside, worsening weakness in your arms, recent fevers, and increasing forgetfulness. We also reviewed your ongoing physical and occupational therapy and your general health maintenance.  YOUR PLAN: -SACRAL PRESSURE INJURY, STAGE 1: A stage 1 sacral pressure injury is a sore caused by prolonged sitting, leading to redness and swelling. To manage this, apply hydrocolloid dressings to protect the area, shift your weight every hour, and consider a hospital bed if needed. Home health nursing will monitor the wound for any skin breakdown or signs of infection.  -GENERALIZED WEAKNESS AND FUNCTIONAL DECLINE: Your generalized weakness and functional decline are due to shoulder issues, neuropathy, and lack of mobility. Continue with physical and occupational therapy to improve strength and mobility. We may need to consider alternative seating to help with transfers and reduce pressure on your sacral area. If there is no progress, additional support or rehabilitation may be necessary.  -ACQUIRED ABSENCE OF RIGHT LEG BELOW KNEE: You have challenges in mobility and transfers due to the amputation of your right leg below the knee, compounded by weakness and shoulder issues. Continue with physical and occupational therapy to improve your mobility and transfers. We will reassess the need for additional support or rehabilitation if there is no progress.  -HAND AND ARM WEAKNESS DUE TO NEUROPATHY AND OSTEOARTHRITIS: Your chronic hand and arm weakness is due to neuropathy and osteoarthritis, which are contributing to your functional decline. Continue with your current management plan for neuropathy and arthritis.  -COGNITIVE IMPAIRMENT: Your increasing forgetfulness and cognitive impairment may be due to medication side effects and general health decline. We will monitor this closely.  -FEVER AND COUGH, STATUS POST RECENT UPPER RESPIRATORY TRACT  INFECTION: Your current low-grade fevers and cough may indicate a lingering infection. Monitor your temperature and report any significant changes or persistent fever. We will avoid additional antibiotics unless there are signs of a new infection.  INSTRUCTIONS: Please follow up with home health nursing to monitor your sacral pressure injury. Continue with physical and occupational therapy as recommended. Monitor your temperature and report any significant changes or persistent fever. If there is no improvement in your mobility or strength, we may need to consider additional support or rehabilitation.

## 2024-02-20 NOTE — Progress Notes (Signed)
 Established Patient Office Visit  Subjective   Patient ID: Levi [name], male    DOB: 08-23-1945  Age: 78 y.o. MRN: [id number]  Chief Complaint  Patient presents with   Medical Management of Chronic Issues    4 week follow up  Increase weakness in arms and hands recently  In the last 2 days fever 100.8-101 Lump on very bottom of back near his bottom  Quite Forgetful here recently     HPI  Discussed the use of AI scribe software for clinical note transcription with the patient, who gave verbal consent to proceed.  History of Present Illness Levi [name] is a 78 year old male who presents with a new lump on his backside and worsening weakness in his arms.  He has developed a new painful lump on his backside, described as a pressure sore, located between his buttocks, slightly to the right. The lump has been present for the last few days, with a 'funny little indentation' but no skin breakdown. He spends most of his day in a wheelchair and has been going to bed earlier due to the pain.  He experiences significant weakness in his arms and hands, which has progressed over the years. He is unable to open bottles or perform tasks requiring hand strength. The weakness in his right arm has been present for a couple of years, while the left arm weakness developed over the last six months. He describes the sensation as if wearing 'arthritis gloves.'  He has been experiencing fevers at night, with recent temperatures recorded at 101.102F three days ago and 100.48F last night and this morning. He was treated for an upper respiratory tract infection weeks ago, but it is unclear if it resolved completely. He also reports a persistent cough.  He is becoming increasingly forgetful, often forgetting destinations and reasons for outings, such as doctor appointments. His caregiver notes that he forgot a doctor's appointment three times recently.  He has been receiving physical and occupational  therapy at home, with therapists visiting multiple times a week. The occupational therapist focuses on alternative methods to manage his shoulder problem. He is unable to perform standard pivots due to shoulder issues and has lost some mobility since his inpatient rehab.  No constipation despite feeling the need for a bowel movement without success. He reports going to the bathroom two or three times yesterday.    Objective:     BP 99/63   Pulse (!) 106   Temp 98.3 F (36.8 C) (Oral)   Physical Exam  Gen: Chronically ill-appearing man Neuro: Alert, conversational, mild weakness in both upper extremities, limited by arthritis in his right shoulder, weakness in his left lower extremity, unable to stand from a chair, it took two-person assistance to transfer him from the wheelchair to the low exam table Skin: On his sacrum he has redness and induration, no skin breakdown, no drainage, no fluctuance.  I placed a hydrocolloid dressing on his sacrum and gave extra dressing to his partner.    Assessment & Plan:    Problem List Items Addressed This Visit       High   Acquired absence of right leg below knee (HCC) (Chronic)   Generalized weakness and functional decline due to shoulder issues, neuropathy, and lack of mobility. Concerns about home management and potential need for rehabilitation. Encouraged physical and occupational therapy to improve strength and mobility. Consider alternative seating to facilitate transfers and reduce sacral pressure. Evaluate the need for  additional support or rehabilitation if no progress.  Will refer to PMR for complex rehab needs and accommodations.  Unfortunately he has declined after his inpatient rehab treatment.      Relevant Orders   Ambulatory referral to Physical Medicine Rehab   Sacral wound, initial encounter - Primary   Stage 1 sacral pressure injury with redness and swelling from prolonged sitting. Risk of progression exists if not managed.  Apply hydrocolloid dressings to protect from pressure. Order home health nursing to monitor the wound and check for skin breakdown. Instruct him to shift weight every hour to offload pressure.  He has a hospital bed at home.  Since discharge from acute inpatient rehab his conditioning has declined.  He is no longer able to transfer independently on his left leg.  He is at high risk for worsening of this sacral wound in the coming weeks.  We talked extensively about the importance of ongoing physical therapy, Occupational Therapy, and working on his transfers.  At risk for hospitalization.  At risk for nursing facility placement.      Relevant Orders   Ambulatory referral to Home Health     Medium    Peripheral neuropathy (Chronic)   Chronic hand and arm weakness due to neuropathy and osteoarthritis contributing to functional decline. Continue current neuropathy and arthritis management.       Return in about 4 weeks (around 03/19/2024).    Cleatus Debby Specking, MD

## 2024-02-20 NOTE — Assessment & Plan Note (Signed)
 Chronic hand and arm weakness due to neuropathy and osteoarthritis contributing to functional decline. Continue current neuropathy and arthritis management.

## 2024-02-21 ENCOUNTER — Other Ambulatory Visit: Payer: Self-pay

## 2024-02-21 ENCOUNTER — Inpatient Hospital Stay (HOSPITAL_COMMUNITY)
Admission: EM | Admit: 2024-02-21 | Discharge: 2024-02-27 | DRG: 872 | Disposition: A | Discharge status: Rehabilitation Facility | Attending: Internal Medicine | Admitting: Internal Medicine

## 2024-02-21 ENCOUNTER — Encounter (HOSPITAL_COMMUNITY): Payer: Self-pay

## 2024-02-21 ENCOUNTER — Emergency Department (HOSPITAL_COMMUNITY)

## 2024-02-21 ENCOUNTER — Encounter: Payer: Self-pay | Admitting: Orthopedic Surgery

## 2024-02-21 DIAGNOSIS — Z833 Family history of diabetes mellitus: Secondary | ICD-10-CM

## 2024-02-21 DIAGNOSIS — E872 Acidosis, unspecified: Secondary | ICD-10-CM | POA: Diagnosis present

## 2024-02-21 DIAGNOSIS — Z89519 Acquired absence of unspecified leg below knee: Secondary | ICD-10-CM | POA: Diagnosis not present

## 2024-02-21 DIAGNOSIS — Z89422 Acquired absence of other left toe(s): Secondary | ICD-10-CM | POA: Diagnosis not present

## 2024-02-21 DIAGNOSIS — N400 Enlarged prostate without lower urinary tract symptoms: Secondary | ICD-10-CM | POA: Diagnosis present

## 2024-02-21 DIAGNOSIS — N179 Acute kidney failure, unspecified: Secondary | ICD-10-CM

## 2024-02-21 DIAGNOSIS — Z79899 Other long term (current) drug therapy: Secondary | ICD-10-CM | POA: Diagnosis not present

## 2024-02-21 DIAGNOSIS — S71112A Laceration without foreign body, left thigh, initial encounter: Secondary | ICD-10-CM | POA: Diagnosis present

## 2024-02-21 DIAGNOSIS — J439 Emphysema, unspecified: Secondary | ICD-10-CM | POA: Diagnosis present

## 2024-02-21 DIAGNOSIS — L0231 Cutaneous abscess of buttock: Secondary | ICD-10-CM | POA: Diagnosis not present

## 2024-02-21 DIAGNOSIS — Z96698 Presence of other orthopedic joint implants: Secondary | ICD-10-CM | POA: Diagnosis not present

## 2024-02-21 DIAGNOSIS — I5022 Chronic systolic (congestive) heart failure: Secondary | ICD-10-CM | POA: Diagnosis present

## 2024-02-21 DIAGNOSIS — T8789 Other complications of amputation stump: Secondary | ICD-10-CM | POA: Diagnosis present

## 2024-02-21 DIAGNOSIS — E876 Hypokalemia: Secondary | ICD-10-CM | POA: Diagnosis present

## 2024-02-21 DIAGNOSIS — E785 Hyperlipidemia, unspecified: Secondary | ICD-10-CM | POA: Diagnosis present

## 2024-02-21 DIAGNOSIS — Z7901 Long term (current) use of anticoagulants: Secondary | ICD-10-CM

## 2024-02-21 DIAGNOSIS — R6520 Severe sepsis without septic shock: Secondary | ICD-10-CM | POA: Diagnosis present

## 2024-02-21 DIAGNOSIS — D72829 Elevated white blood cell count, unspecified: Secondary | ICD-10-CM | POA: Diagnosis not present

## 2024-02-21 DIAGNOSIS — Z9889 Other specified postprocedural states: Secondary | ICD-10-CM

## 2024-02-21 DIAGNOSIS — S81811A Laceration without foreign body, right lower leg, initial encounter: Secondary | ICD-10-CM | POA: Diagnosis present

## 2024-02-21 DIAGNOSIS — M25561 Pain in right knee: Secondary | ICD-10-CM | POA: Diagnosis not present

## 2024-02-21 DIAGNOSIS — R7881 Bacteremia: Secondary | ICD-10-CM | POA: Diagnosis not present

## 2024-02-21 DIAGNOSIS — Z860100 Personal history of colon polyps, unspecified: Secondary | ICD-10-CM

## 2024-02-21 DIAGNOSIS — R918 Other nonspecific abnormal finding of lung field: Secondary | ICD-10-CM | POA: Diagnosis not present

## 2024-02-21 DIAGNOSIS — R000 Tachycardia, unspecified: Secondary | ICD-10-CM | POA: Diagnosis not present

## 2024-02-21 DIAGNOSIS — T380X5A Adverse effect of glucocorticoids and synthetic analogues, initial encounter: Secondary | ICD-10-CM | POA: Diagnosis present

## 2024-02-21 DIAGNOSIS — I110 Hypertensive heart disease with heart failure: Secondary | ICD-10-CM | POA: Diagnosis not present

## 2024-02-21 DIAGNOSIS — Z7952 Long term (current) use of systemic steroids: Secondary | ICD-10-CM

## 2024-02-21 DIAGNOSIS — M170 Bilateral primary osteoarthritis of knee: Secondary | ICD-10-CM | POA: Diagnosis present

## 2024-02-21 DIAGNOSIS — M47812 Spondylosis without myelopathy or radiculopathy, cervical region: Secondary | ICD-10-CM | POA: Diagnosis present

## 2024-02-21 DIAGNOSIS — R053 Chronic cough: Secondary | ICD-10-CM | POA: Diagnosis present

## 2024-02-21 DIAGNOSIS — I5021 Acute systolic (congestive) heart failure: Secondary | ICD-10-CM | POA: Diagnosis not present

## 2024-02-21 DIAGNOSIS — D509 Iron deficiency anemia, unspecified: Secondary | ICD-10-CM | POA: Diagnosis present

## 2024-02-21 DIAGNOSIS — K219 Gastro-esophageal reflux disease without esophagitis: Secondary | ICD-10-CM | POA: Diagnosis present

## 2024-02-21 DIAGNOSIS — E440 Moderate protein-calorie malnutrition: Secondary | ICD-10-CM | POA: Diagnosis present

## 2024-02-21 DIAGNOSIS — J449 Chronic obstructive pulmonary disease, unspecified: Secondary | ICD-10-CM | POA: Diagnosis present

## 2024-02-21 DIAGNOSIS — K5900 Constipation, unspecified: Secondary | ICD-10-CM | POA: Diagnosis not present

## 2024-02-21 DIAGNOSIS — J9811 Atelectasis: Secondary | ICD-10-CM | POA: Diagnosis present

## 2024-02-21 DIAGNOSIS — Z8701 Personal history of pneumonia (recurrent): Secondary | ICD-10-CM

## 2024-02-21 DIAGNOSIS — R531 Weakness: Secondary | ICD-10-CM | POA: Diagnosis not present

## 2024-02-21 DIAGNOSIS — Y835 Amputation of limb(s) as the cause of abnormal reaction of the patient, or of later complication, without mention of misadventure at the time of the procedure: Secondary | ICD-10-CM | POA: Diagnosis present

## 2024-02-21 DIAGNOSIS — Z96651 Presence of right artificial knee joint: Secondary | ICD-10-CM | POA: Diagnosis not present

## 2024-02-21 DIAGNOSIS — I951 Orthostatic hypotension: Secondary | ICD-10-CM | POA: Diagnosis not present

## 2024-02-21 DIAGNOSIS — T148XXA Other injury of unspecified body region, initial encounter: Secondary | ICD-10-CM | POA: Diagnosis present

## 2024-02-21 DIAGNOSIS — L89151 Pressure ulcer of sacral region, stage 1: Secondary | ICD-10-CM | POA: Diagnosis present

## 2024-02-21 DIAGNOSIS — Z981 Arthrodesis status: Secondary | ICD-10-CM

## 2024-02-21 DIAGNOSIS — B9562 Methicillin resistant Staphylococcus aureus infection as the cause of diseases classified elsewhere: Secondary | ICD-10-CM | POA: Diagnosis present

## 2024-02-21 DIAGNOSIS — Z83438 Family history of other disorder of lipoprotein metabolism and other lipidemia: Secondary | ICD-10-CM

## 2024-02-21 DIAGNOSIS — R5381 Other malaise: Secondary | ICD-10-CM | POA: Diagnosis not present

## 2024-02-21 DIAGNOSIS — J440 Chronic obstructive pulmonary disease with acute lower respiratory infection: Secondary | ICD-10-CM | POA: Diagnosis not present

## 2024-02-21 DIAGNOSIS — I080 Rheumatic disorders of both mitral and aortic valves: Secondary | ICD-10-CM | POA: Diagnosis present

## 2024-02-21 DIAGNOSIS — X58XXXA Exposure to other specified factors, initial encounter: Secondary | ICD-10-CM | POA: Diagnosis present

## 2024-02-21 DIAGNOSIS — J189 Pneumonia, unspecified organism: Principal | ICD-10-CM | POA: Diagnosis present

## 2024-02-21 DIAGNOSIS — I4891 Unspecified atrial fibrillation: Secondary | ICD-10-CM | POA: Diagnosis not present

## 2024-02-21 DIAGNOSIS — A4102 Sepsis due to Methicillin resistant Staphylococcus aureus: Principal | ICD-10-CM | POA: Diagnosis present

## 2024-02-21 DIAGNOSIS — Z8249 Family history of ischemic heart disease and other diseases of the circulatory system: Secondary | ICD-10-CM | POA: Diagnosis not present

## 2024-02-21 DIAGNOSIS — R413 Other amnesia: Secondary | ICD-10-CM | POA: Diagnosis not present

## 2024-02-21 DIAGNOSIS — R338 Other retention of urine: Secondary | ICD-10-CM | POA: Diagnosis present

## 2024-02-21 DIAGNOSIS — R4189 Other symptoms and signs involving cognitive functions and awareness: Secondary | ICD-10-CM | POA: Diagnosis not present

## 2024-02-21 DIAGNOSIS — G629 Polyneuropathy, unspecified: Secondary | ICD-10-CM | POA: Diagnosis present

## 2024-02-21 DIAGNOSIS — I351 Nonrheumatic aortic (valve) insufficiency: Secondary | ICD-10-CM | POA: Diagnosis not present

## 2024-02-21 DIAGNOSIS — D689 Coagulation defect, unspecified: Secondary | ICD-10-CM | POA: Diagnosis present

## 2024-02-21 DIAGNOSIS — M810 Age-related osteoporosis without current pathological fracture: Secondary | ICD-10-CM | POA: Diagnosis present

## 2024-02-21 DIAGNOSIS — Z825 Family history of asthma and other chronic lower respiratory diseases: Secondary | ICD-10-CM

## 2024-02-21 DIAGNOSIS — Z96643 Presence of artificial hip joint, bilateral: Secondary | ICD-10-CM | POA: Diagnosis present

## 2024-02-21 DIAGNOSIS — Z96612 Presence of left artificial shoulder joint: Secondary | ICD-10-CM | POA: Diagnosis present

## 2024-02-21 DIAGNOSIS — S92332A Displaced fracture of third metatarsal bone, left foot, initial encounter for closed fracture: Secondary | ICD-10-CM | POA: Diagnosis not present

## 2024-02-21 DIAGNOSIS — Z89511 Acquired absence of right leg below knee: Secondary | ICD-10-CM | POA: Diagnosis not present

## 2024-02-21 DIAGNOSIS — R234 Changes in skin texture: Secondary | ICD-10-CM | POA: Diagnosis not present

## 2024-02-21 DIAGNOSIS — D692 Other nonthrombocytopenic purpura: Secondary | ICD-10-CM | POA: Diagnosis present

## 2024-02-21 DIAGNOSIS — Z1152 Encounter for screening for COVID-19: Secondary | ICD-10-CM | POA: Diagnosis not present

## 2024-02-21 DIAGNOSIS — F39 Unspecified mood [affective] disorder: Secondary | ICD-10-CM | POA: Diagnosis not present

## 2024-02-21 DIAGNOSIS — Z823 Family history of stroke: Secondary | ICD-10-CM

## 2024-02-21 DIAGNOSIS — I480 Paroxysmal atrial fibrillation: Secondary | ICD-10-CM | POA: Diagnosis not present

## 2024-02-21 DIAGNOSIS — Z800 Family history of malignant neoplasm of digestive organs: Secondary | ICD-10-CM

## 2024-02-21 DIAGNOSIS — I2510 Atherosclerotic heart disease of native coronary artery without angina pectoris: Secondary | ICD-10-CM | POA: Diagnosis not present

## 2024-02-21 DIAGNOSIS — J181 Lobar pneumonia, unspecified organism: Secondary | ICD-10-CM | POA: Diagnosis not present

## 2024-02-21 DIAGNOSIS — M25511 Pain in right shoulder: Secondary | ICD-10-CM | POA: Diagnosis present

## 2024-02-21 DIAGNOSIS — A419 Sepsis, unspecified organism: Secondary | ICD-10-CM | POA: Diagnosis present

## 2024-02-21 DIAGNOSIS — E039 Hypothyroidism, unspecified: Secondary | ICD-10-CM | POA: Diagnosis present

## 2024-02-21 DIAGNOSIS — N401 Enlarged prostate with lower urinary tract symptoms: Secondary | ICD-10-CM | POA: Diagnosis present

## 2024-02-21 DIAGNOSIS — Z96653 Presence of artificial knee joint, bilateral: Secondary | ICD-10-CM | POA: Diagnosis present

## 2024-02-21 DIAGNOSIS — D649 Anemia, unspecified: Secondary | ICD-10-CM | POA: Diagnosis present

## 2024-02-21 DIAGNOSIS — J90 Pleural effusion, not elsewhere classified: Secondary | ICD-10-CM | POA: Diagnosis not present

## 2024-02-21 DIAGNOSIS — Z8042 Family history of malignant neoplasm of prostate: Secondary | ICD-10-CM

## 2024-02-21 DIAGNOSIS — Z83719 Family history of colon polyps, unspecified: Secondary | ICD-10-CM

## 2024-02-21 DIAGNOSIS — K5901 Slow transit constipation: Secondary | ICD-10-CM | POA: Diagnosis not present

## 2024-02-21 DIAGNOSIS — L539 Erythematous condition, unspecified: Secondary | ICD-10-CM | POA: Diagnosis not present

## 2024-02-21 DIAGNOSIS — R609 Edema, unspecified: Secondary | ICD-10-CM | POA: Diagnosis not present

## 2024-02-21 DIAGNOSIS — I340 Nonrheumatic mitral (valve) insufficiency: Secondary | ICD-10-CM | POA: Diagnosis not present

## 2024-02-21 DIAGNOSIS — Z881 Allergy status to other antibiotic agents status: Secondary | ICD-10-CM

## 2024-02-21 DIAGNOSIS — L03317 Cellulitis of buttock: Secondary | ICD-10-CM | POA: Diagnosis present

## 2024-02-21 DIAGNOSIS — R7989 Other specified abnormal findings of blood chemistry: Secondary | ICD-10-CM | POA: Diagnosis not present

## 2024-02-21 DIAGNOSIS — G8929 Other chronic pain: Secondary | ICD-10-CM | POA: Diagnosis present

## 2024-02-21 LAB — CBC WITH DIFFERENTIAL/PLATELET
Abs Immature Granulocytes: 0.59 K/uL — ABNORMAL HIGH (ref 0.00–0.07)
Basophils Absolute: 0 K/uL (ref 0.0–0.1)
Basophils Relative: 0 %
Eosinophils Absolute: 0 K/uL (ref 0.0–0.5)
Eosinophils Relative: 0 %
HCT: 29.1 % — ABNORMAL LOW (ref 39.0–52.0)
Hemoglobin: 9.2 g/dL — ABNORMAL LOW (ref 13.0–17.0)
Immature Granulocytes: 4 %
Lymphocytes Relative: 4 %
Lymphs Abs: 0.5 K/uL — ABNORMAL LOW (ref 0.7–4.0)
MCH: 28.3 pg (ref 26.0–34.0)
MCHC: 31.6 g/dL (ref 30.0–36.0)
MCV: 89.5 fL (ref 80.0–100.0)
Monocytes Absolute: 0.7 K/uL (ref 0.1–1.0)
Monocytes Relative: 5 %
Neutro Abs: 11.9 K/uL — ABNORMAL HIGH (ref 1.7–7.7)
Neutrophils Relative %: 87 %
Platelets: 157 K/uL (ref 150–400)
RBC: 3.25 MIL/uL — ABNORMAL LOW (ref 4.22–5.81)
RDW: 17.3 % — ABNORMAL HIGH (ref 11.5–15.5)
WBC: 13.7 K/uL — ABNORMAL HIGH (ref 4.0–10.5)
nRBC: 0 % (ref 0.0–0.2)

## 2024-02-21 LAB — RESP PANEL BY RT-PCR (RSV, FLU A&B, COVID)  RVPGX2
Influenza A by PCR: NEGATIVE
Influenza B by PCR: NEGATIVE
Resp Syncytial Virus by PCR: NEGATIVE
SARS Coronavirus 2 by RT PCR: NEGATIVE

## 2024-02-21 LAB — URINALYSIS, W/ REFLEX TO CULTURE (INFECTION SUSPECTED)
Bacteria, UA: NONE SEEN
Bilirubin Urine: NEGATIVE
Glucose, UA: NEGATIVE mg/dL
Hgb urine dipstick: NEGATIVE
Ketones, ur: NEGATIVE mg/dL
Leukocytes,Ua: NEGATIVE
Nitrite: NEGATIVE
Protein, ur: 30 mg/dL — AB
Specific Gravity, Urine: 1.015 (ref 1.005–1.030)
pH: 6 (ref 5.0–8.0)

## 2024-02-21 LAB — COMPREHENSIVE METABOLIC PANEL WITH GFR
ALT: 20 U/L (ref 0–44)
AST: 24 U/L (ref 15–41)
Albumin: 2.1 g/dL — ABNORMAL LOW (ref 3.5–5.0)
Alkaline Phosphatase: 74 U/L (ref 38–126)
Anion gap: 13 (ref 5–15)
BUN: 20 mg/dL (ref 8–23)
CO2: 24 mmol/L (ref 22–32)
Calcium: 8.2 mg/dL — ABNORMAL LOW (ref 8.9–10.3)
Chloride: 101 mmol/L (ref 98–111)
Creatinine, Ser: 1.12 mg/dL (ref 0.61–1.24)
GFR, Estimated: >60 mL/min (ref >60)
Glucose, Bld: 157 mg/dL — ABNORMAL HIGH (ref 70–99)
Potassium: 3 mmol/L — ABNORMAL LOW (ref 3.5–5.1)
Sodium: 138 mmol/L (ref 135–145)
Total Bilirubin: 1.1 mg/dL (ref 0.0–1.2)
Total Protein: 4.9 g/dL — ABNORMAL LOW (ref 6.5–8.1)

## 2024-02-21 LAB — CBG MONITORING, ED: Glucose-Capillary: 158 mg/dL — ABNORMAL HIGH (ref 70–99)

## 2024-02-21 LAB — PROTIME-INR
INR: 1.7 — ABNORMAL HIGH (ref 0.8–1.2)
Prothrombin Time: 21 s — ABNORMAL HIGH (ref 11.4–15.2)

## 2024-02-21 LAB — I-STAT CG4 LACTIC ACID, ED
Lactic Acid, Venous: 2.7 mmol/L (ref 0.5–1.9)
Lactic Acid, Venous: 1.1 mmol/L (ref 0.5–1.9)

## 2024-02-21 LAB — MAGNESIUM: Magnesium: 1.6 mg/dL — ABNORMAL LOW (ref 1.7–2.4)

## 2024-02-21 MED ORDER — ATORVASTATIN CALCIUM 10 MG PO TABS
20.0000 mg | ORAL_TABLET | Freq: Every day | ORAL | Status: DC
  Administered 2024-02-22 – 2024-02-27 (×6): 20 mg via ORAL
  Filled 2024-02-21 (×6): qty 2

## 2024-02-21 MED ORDER — VANCOMYCIN HCL IN DEXTROSE 1-5 GM/200ML-% IV SOLN
1000.0000 mg | Freq: Once | INTRAVENOUS | Status: DC
Start: 2024-02-21 — End: 2024-02-21

## 2024-02-21 MED ORDER — GUAIFENESIN-DM 100-10 MG/5ML PO SYRP: 5.0000 mL | ORAL_SOLUTION | ORAL | Status: DC | PRN

## 2024-02-21 MED ORDER — ACETAMINOPHEN 325 MG PO TABS
650.0000 mg | ORAL_TABLET | Freq: Four times a day (QID) | ORAL | Status: DC | PRN
  Administered 2024-02-27: 650 mg via ORAL
  Filled 2024-02-21: qty 2

## 2024-02-21 MED ORDER — SODIUM CHLORIDE 0.9 % IV SOLN
500.0000 mg | Freq: Once | INTRAVENOUS | Status: AC
  Administered 2024-02-21: 500 mg via INTRAVENOUS
  Filled 2024-02-21: qty 5

## 2024-02-21 MED ORDER — METRONIDAZOLE 500 MG/100ML IV SOLN
500.0000 mg | Freq: Once | INTRAVENOUS | Status: AC
  Administered 2024-02-21: 500 mg via INTRAVENOUS
  Filled 2024-02-21: qty 100

## 2024-02-21 MED ORDER — SODIUM CHLORIDE 0.9 % IV SOLN: INTRAVENOUS | Status: DC

## 2024-02-21 MED ORDER — IPRATROPIUM-ALBUTEROL 0.5-2.5 (3) MG/3ML IN SOLN
3.0000 mL | Freq: Four times a day (QID) | RESPIRATORY_TRACT | Status: DC | PRN
  Administered 2024-02-26 (×2): 3 mL via RESPIRATORY_TRACT
  Filled 2024-02-21 (×2): qty 3

## 2024-02-21 MED ORDER — LACTATED RINGERS IV BOLUS (SEPSIS)
1000.0000 mL | Freq: Once | INTRAVENOUS | Status: AC
  Administered 2024-02-21: 1000 mL via INTRAVENOUS

## 2024-02-21 MED ORDER — PREGABALIN 75 MG PO CAPS
150.0000 mg | ORAL_CAPSULE | Freq: Every day | ORAL | Status: DC
  Administered 2024-02-22 – 2024-02-27 (×6): 150 mg via ORAL
  Filled 2024-02-21 (×6): qty 2

## 2024-02-21 MED ORDER — PREDNISONE 20 MG PO TABS
20.0000 mg | ORAL_TABLET | Freq: Every day | ORAL | Status: DC
  Administered 2024-02-22 – 2024-02-27 (×6): 20 mg via ORAL
  Filled 2024-02-21 (×6): qty 1

## 2024-02-21 MED ORDER — APIXABAN 5 MG PO TABS
5.0000 mg | ORAL_TABLET | Freq: Two times a day (BID) | ORAL | Status: DC
Start: 2024-02-21 — End: 2024-02-27
  Administered 2024-02-21 – 2024-02-27 (×12): 5 mg via ORAL
  Filled 2024-02-21 (×12): qty 1

## 2024-02-21 MED ORDER — MAGNESIUM SULFATE 2 GM/50ML IV SOLN
2.0000 g | Freq: Once | INTRAVENOUS | Status: AC
  Administered 2024-02-21: 2 g via INTRAVENOUS
  Filled 2024-02-21: qty 50

## 2024-02-21 MED ORDER — PANTOPRAZOLE SODIUM 40 MG PO TBEC
40.0000 mg | DELAYED_RELEASE_TABLET | Freq: Two times a day (BID) | ORAL | Status: DC
Start: 2024-02-22 — End: 2024-02-27
  Administered 2024-02-22 – 2024-02-27 (×11): 40 mg via ORAL
  Filled 2024-02-21 (×11): qty 1

## 2024-02-21 MED ORDER — ARFORMOTEROL TARTRATE 15 MCG/2ML IN NEBU
15.0000 ug | INHALATION_SOLUTION | Freq: Two times a day (BID) | RESPIRATORY_TRACT | Status: DC
  Administered 2024-02-22 – 2024-02-27 (×11): 15 ug via RESPIRATORY_TRACT
  Filled 2024-02-21 (×11): qty 2

## 2024-02-21 MED ORDER — PREGABALIN 75 MG PO CAPS
75.0000 mg | ORAL_CAPSULE | Freq: Every evening | ORAL | Status: DC
  Administered 2024-02-21 – 2024-02-26 (×6): 75 mg via ORAL
  Filled 2024-02-21: qty 3
  Filled 2024-02-21 (×5): qty 1

## 2024-02-21 MED ORDER — CEFEPIME HCL 2 G IV SOLR
2.0000 g | Freq: Three times a day (TID) | INTRAVENOUS | Status: DC
  Administered 2024-02-22 (×2): 2 g via INTRAVENOUS
  Filled 2024-02-21 (×2): qty 12.5

## 2024-02-21 MED ORDER — VANCOMYCIN HCL 1750 MG/350ML IV SOLN
1750.0000 mg | Freq: Once | INTRAVENOUS | Status: AC
  Administered 2024-02-21: 1750 mg via INTRAVENOUS
  Filled 2024-02-21: qty 350

## 2024-02-21 MED ORDER — PREGABALIN 25 MG PO CAPS: 75.0000 mg | ORAL_CAPSULE | ORAL | Status: DC

## 2024-02-21 MED ORDER — FERROUS GLUCONATE 324 (38 FE) MG PO TABS
324.0000 mg | ORAL_TABLET | Freq: Every day | ORAL | Status: DC
  Administered 2024-02-22 – 2024-02-27 (×6): 324 mg via ORAL
  Filled 2024-02-21 (×7): qty 1

## 2024-02-21 MED ORDER — TAMSULOSIN HCL 0.4 MG PO CAPS
0.4000 mg | ORAL_CAPSULE | Freq: Every day | ORAL | Status: DC
  Administered 2024-02-21 – 2024-02-26 (×6): 0.4 mg via ORAL
  Filled 2024-02-21 (×6): qty 1

## 2024-02-21 MED ORDER — VANCOMYCIN HCL 1750 MG/350ML IV SOLN
1750.0000 mg | INTRAVENOUS | Status: DC
Start: 2024-02-22 — End: 2024-02-22
  Filled 2024-02-21: qty 350

## 2024-02-21 MED ORDER — POTASSIUM CHLORIDE 20 MEQ PO PACK
40.0000 meq | PACK | Freq: Once | ORAL | Status: AC
  Administered 2024-02-21: 40 meq via ORAL
  Filled 2024-02-21: qty 2

## 2024-02-21 MED ORDER — SODIUM CHLORIDE 0.9 % IV SOLN
2.0000 g | Freq: Once | INTRAVENOUS | Status: AC
  Administered 2024-02-21: 2 g via INTRAVENOUS
  Filled 2024-02-21: qty 12.5

## 2024-02-21 MED ORDER — ACETAMINOPHEN 650 MG RE SUPP: 650.0000 mg | Freq: Four times a day (QID) | RECTAL | Status: DC | PRN

## 2024-02-21 NOTE — ED Provider Notes (Addendum)
 [place] EMERGENCY DEPARTMENT AT [facility] Provider Note   CSN: [id number] Arrival date & time: 02/21/24  1658     Patient presents with: Blood Infection and Code Sepsis   Levi [name] is a 78 y.o. male.   HPI Patient presents for 1 week of progressive generalized weakness and fatigue.  Medical history includes asthma, atrial fibrillation, HLD, GERD, BPH, arthritis, CHF, chronic sacral wound.  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  He had a recent URI and was treated with antibiotics.  He has had a persistent cough since that time.  At baseline, he is able to transfer with moderate assistance.  He has had increased weakness and increased need for assistance with transfers.  He is prescribed Eliquis  as well as multiple blood pressure medications.  He is unsure if he took those this morning.  He states that he took his normal morning medications.  His wife report recent low-grade fevers at home.  Oral temperature earlier today was 100.4 degrees.  EMS noted tachycardia with soft blood pressures in the range of 90s over 60s.  No fluids were given prior to arrival.  He was given 650 mg of Tylenol  with EMS.  Patient endorses pain in the posterior aspect of his right leg stump.  He denies any other current areas of discomfort.  EMS did note that he was soiled in stool on their arrival.    Prior to Admission medications   Medication Sig Start Date End Date Taking? Authorizing Provider  albuterol  (VENTOLIN  HFA) 108 (90 Base) MCG/ACT inhaler INHALE 2 PUFFS BY MOUTH EVERY 4 HOURS AS NEEDED FOR WHEEZE OR FOR SHORTNESS OF BREATH 01/19/24   Neysa Rama D, MD  apixaban  (ELIQUIS ) 5 MG TABS tablet Take 1 tablet (5 mg total) by mouth 2 (two) times daily. 02/02/24   Jeffrie Oneil BROCKS, MD  atorvastatin  (LIPITOR) 20 MG tablet Take 1 tablet (20 mg total) by mouth daily. 12/03/23   Jeffrie Oneil BROCKS, MD  cyclobenzaprine  (FLEXERIL ) 10 MG tablet TAKE 1 TABLET BY MOUTH THREE TIMES A DAY AS NEEDED FOR MUSCLE  SPASMS 12/31/23   Curtis Debby PARAS, MD  diltiazem  (CARDIZEM  CD) 180 MG 24 hr capsule Take 1 capsule (180 mg total) by mouth daily. 08/25/23   Jeffrie Oneil BROCKS, MD  DULoxetine  (CYMBALTA ) 30 MG capsule TAKE 1 CAPSULE BY MOUTH EVERY DAY 01/06/24   Curtis Debby PARAS, MD  ferrous gluconate  (FERGON) 324 MG tablet Take 1 tablet (324 mg total) by mouth daily. 12/05/23   Love, Sharlet RAMAN, PA-C  ipratropium-albuterol  (DUONEB) 0.5-2.5 (3) MG/3ML SOLN 1 NEBULE EVERY 6 HOURS AS NEEDED. **J45.3** 03/22/20   Young, Rama D, MD  isosorbide  mononitrate (IMDUR ) 30 MG 24 hr tablet Take 1 tablet (30 mg total) by mouth daily. 12/18/23   Jeffrie Oneil BROCKS, MD  melatonin 5 MG TABS Take 1 tablet (5 mg total) by mouth at bedtime as needed. 12/05/23   Love, Sharlet RAMAN, PA-C  Olodaterol HCl (STRIVERDI RESPIMAT) 2.5 MCG/ACT AERS INHALE 2 PUFFS BY MOUTH INTO THE LUNGS DAILY 03/10/23   Neysa Rama BIRCH, MD  pantoprazole  (PROTONIX ) 40 MG tablet Take 1 tablet (40 mg total) by mouth 2 (two) times daily before a meal. 12/05/23   Love, Sharlet RAMAN, PA-C  predniSONE  (DELTASONE ) 5 MG tablet 3 tabs daily 12/25/23   Neysa Rama D, MD  pregabalin  (LYRICA ) 150 MG capsule Take 1 capsule (150 mg total) by mouth 2 (two) times daily. 01/15/24   Urbano Albright,  MD  tamsulosin  (FLOMAX ) 0.4 MG CAPS capsule Take 1 capsule (0.4 mg total) by mouth at bedtime. 12/05/23   Love, Sharlet RAMAN, PA-C    Allergies: Levaquin  [levofloxacin ]    Review of Systems  Constitutional:  Positive for fatigue and fever.  Skin:  Positive for wound.  Neurological:  Positive for weakness (Generalized).  All other systems reviewed and are negative.   Updated Vital Signs BP 115/60   Pulse 66   Temp 98.2 F (36.8 C) (Rectal)   Resp 16   Ht 6' 2 (1.88 m)   Wt 86.2 kg   SpO2 93%   BMI 24.39 kg/m   Physical Exam Vitals and nursing note reviewed.  Constitutional:      General: He is not in acute distress.    Appearance: Normal appearance. He is well-developed. He  is not ill-appearing, toxic-appearing or diaphoretic.  HENT:     Head: Normocephalic and atraumatic.     Right Ear: External ear normal.     Left Ear: External ear normal.     Nose: Nose normal.     Mouth/Throat:     Mouth: Mucous membranes are moist.  Eyes:     Extraocular Movements: Extraocular movements intact.     Conjunctiva/sclera: Conjunctivae normal.  Cardiovascular:     Rate and Rhythm: Regular rhythm. Tachycardia present.  Pulmonary:     Effort: Pulmonary effort is normal. No respiratory distress.     Breath sounds: Normal breath sounds. No wheezing or rales.  Abdominal:     General: There is no distension.     Palpations: Abdomen is soft.     Tenderness: There is no abdominal tenderness.  Musculoskeletal:        General: No swelling.     Cervical back: Normal range of motion and neck supple.     Comments: Right BKA present.  Skin:    General: Skin is warm and dry.     Coloration: Skin is not jaundiced or pale.     Comments: Stage I sacral wound.  Skin tear to her left thigh.  Mild swelling to left great toe.  Slight break in skin on medial aspect of stump.  Neurological:     General: No focal deficit present.     Mental Status: He is alert and oriented to person, place, and time.  Psychiatric:        Mood and Affect: Mood normal.        Behavior: Behavior normal.     (all labs ordered are listed, but only abnormal results are displayed) Labs Reviewed  COMPREHENSIVE METABOLIC PANEL WITH GFR - Abnormal; Notable for the following components:      Result Value   Potassium 3.0 (*)    Glucose, Bld 157 (*)    Calcium  8.2 (*)    Total Protein 4.9 (*)    Albumin 2.1 (*)    All other components within normal limits  CBC WITH DIFFERENTIAL/PLATELET - Abnormal; Notable for the following components:   WBC 13.7 (*)    RBC 3.25 (*)    Hemoglobin 9.2 (*)    HCT 29.1 (*)    RDW 17.3 (*)    Neutro Abs 11.9 (*)    Lymphs Abs 0.5 (*)    Abs Immature Granulocytes 0.59 (*)     All other components within normal limits  PROTIME-INR - Abnormal; Notable for the following components:   Prothrombin Time 21.0 (*)    INR 1.7 (*)    All other  components within normal limits  MAGNESIUM  - Abnormal; Notable for the following components:   Magnesium  1.6 (*)    All other components within normal limits  I-STAT CG4 LACTIC ACID, ED - Abnormal; Notable for the following components:   Lactic Acid, Venous 2.7 (*)    All other components within normal limits  CBG MONITORING, ED - Abnormal; Notable for the following components:   Glucose-Capillary 158 (*)    All other components within normal limits  RESP PANEL BY RT-PCR (RSV, FLU A&B, COVID)  [id number]  CULTURE, BLOOD (ROUTINE X 2)  CULTURE, BLOOD (ROUTINE X 2)  URINALYSIS, W/ REFLEX TO CULTURE (INFECTION SUSPECTED)  I-STAT CG4 LACTIC ACID, ED    EKG: EKG Interpretation Date/Time:  Saturday February 21 2024 17:25:44 EDT Ventricular Rate:  84 PR Interval:  142 QRS Duration:  97 QT Interval:  367 QTC Calculation: 434 R Axis:   -19  Text Interpretation: Sinus rhythm Atrial premature complex Borderline left axis deviation Low voltage, precordial leads Abnormal R-wave progression, early transition Confirmed by Melvenia Motto [phone]) on 02/21/2024 7:03:17 PM  Radiology: [facility] Knee 2 Views Right Result Date: 02/21/2024 EXAM: 1 or 2 VIEW(S) XRAY OF THE RIGHT KNEE 02/21/2024 06:23:00 PM COMPARISON: 12/26/2023. CLINICAL HISTORY: stump pain. Reason for exam: stump pain; Per triage notes: Pt bibems from home. Complaints of worsening weakness x 1week. URI x 1 month with productive cough. Sacral wound stage 1. Fever x 2days (100.4 oral). FINDINGS: BONES AND JOINTS: No evidence of acute fracture or osteomyelitis. 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 Below knee amputation. Chronic callus formation at the distal fibular stump. No joint dislocation. No significant joint effusion. SOFT TISSUES: Vascular calcifications. The soft tissues are  otherwise unremarkable. IMPRESSION: 1. No acute findings. Electronically signed by: Norman Gatlin MD 02/21/2024 06:46 PM EDT RP Workstation: [id number]   DG Foot 2 Views Left Result Date: 02/21/2024 EXAM: 2 VIEW(S) XRAY OF THE LEFT FOOT 02/21/2024 06:23:00 PM COMPARISON: 09/09/2023 CLINICAL HISTORY: stump pain. Reason for exam: stump pain; Per triage notes: Pt bibems from home. Complaints of worsening weakness x 1week. URI x 1 month with productive cough. Sacral wound stage 1. Fever x 2days (100.4 oral). FINDINGS: BONES AND JOINTS: Screw fixation of the distal third metatarsal noted. No acute fracture or radiographic evidence of osteomyelitis. Chronic fracture deformity of the third metatarsal neck. No joint dislocation. Amputation of the 2nd digit at the MTP joint. 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 SOFT TISSUES: Mild soft tissue swelling of the forefoot. Vascular calcifications within the soft tissues. IMPRESSION: 1. No acute fracture or radiographic evidence of osteomyelitis Electronically signed by: Norman Gatlin MD 02/21/2024 06:43 PM EDT RP Workstation: [id number]   DG Chest Port 1 View Result Date: 02/21/2024 CLINICAL DATA:  Weakness for 1 week, questionable sepsis EXAM: PORTABLE CHEST 1 VIEW COMPARISON:  12/26/2023 FINDINGS: Single frontal view of the chest demonstrates a stable cardiac silhouette. Increased density in the retrocardiac region may reflect airspace disease or atelectasis. No large effusion or pneumothorax. No acute bony abnormalities. IMPRESSION: 1. Left lower lobe consolidation consistent with atelectasis or airspace disease. Electronically Signed   By: Ozell Daring M.D.   On: 02/21/2024 17:50     Procedures   Medications Ordered in the ED  vancomycin  (VANCOREADY) IVPB 1750 mg/350 mL (has no administration in time range)  potassium chloride  (KLOR-CON ) packet 40 mEq (has no administration in time range)  magnesium  sulfate IVPB 2 g 50 mL (has no administration in time  range)  azithromycin  (ZITHROMAX ) 500  mg in sodium chloride  0.9 % 250 mL IVPB (has no administration in time range)  lactated ringers  bolus 1,000 mL (has no administration in time range)  lactated ringers  bolus 1,000 mL (0 mLs Intravenous Stopped 02/21/24 1839)  ceFEPIme  (MAXIPIME ) 2 g in sodium chloride  0.9 % 100 mL IVPB (0 g Intravenous Stopped 02/21/24 1841)  metroNIDAZOLE  (FLAGYL ) IVPB 500 mg ( Intravenous Restarted 02/21/24 1839)                                    Medical Decision Making Amount and/or Complexity of Data Reviewed Labs: ordered. Radiology: ordered.  Risk Prescription drug management. Decision regarding hospitalization.   This patient presents to the ED for concern of generalized weakness, this involves an extensive number of treatment options, and is a complaint that carries with it a high risk of complications and morbidity.  The differential diagnosis includes sepsis, deconditioning, dehydration, polypharmacy, infection, metabolic derangements   Co morbidities / Chronic conditions that complicate the patient evaluation  asthma, atrial fibrillation, HLD, GERD, BPH, arthritis, CHF, chronic sacral wound   Additional history obtained:  Additional history obtained from EMR External records from outside source obtained and reviewed including EMS, patient's wife   Lab Tests:  I Ordered, and personally interpreted labs.  The pertinent results include: Leukocytosis and lactic acidosis present consistent with sepsis.  Hemoglobin has decreased when compared to lab work from 1 month ago.  Kidney function is normal.  Hypomagnesemia and hypokalemia are present.   Imaging Studies ordered:  I ordered imaging studies including x-ray of right knee, left foot, and chest I independently visualized and interpreted imaging which showed findings consistent with left lower lobe pneumonia I agree with the radiologist interpretation   Cardiac Monitoring: / EKG:  The patient  was maintained on a cardiac monitor.  I personally viewed and interpreted the cardiac monitored which showed an underlying rhythm of: Sinus rhythm   Problem List / ED Course / Critical interventions / Medication management  Patient presenting for progressive generalized weakness and fatigue over the past week.  Has recently had low-grade fevers at home.  EMS noted tachycardia with soft blood pressures.  Patient does meet SIRS criteria.  On arrival in the ED, he is alert and oriented.  He endorses some mild pain in the posterior aspect of his right stump.  This is from a surgery 3 months ago.  Skin around the stump is normal in appearance.  There are no areas of induration or significant tenderness.  Sacral wound is stage I without clear evidence as a source of infection.  Patient has a noninfected skin tear to his left thigh, a nonhealing wound to the medial aspect of his right stump, as well as a mild swelling of his left great toe.  His wife denies any recent worsening of these things.  He has had recent ongoing URI symptoms, despite antibiotic treatment at home.  X-ray of chest today does show a left lower lobe pneumonia.  Azithromycin  was ordered for increased antibiotic coverage.  Patient remained normotensive while in the ED.  SpO2 remains in the low 90s on room air.  While in the ED, patient was unable to urinate.  On bedside ultrasound, he has 600 cc of urine in his bladder.  This is consistent with urine retention.  Foley catheter was ordered.  Patient was admitted for further management. I ordered medication including IV fluids and broad-spectrum  antibiotics for empiric treatment of sepsis; potassium chloride  for hypokalemia; magnesium  sulfate for hypomagnesemia Reevaluation of the patient after these medicines showed that the patient improved I have reviewed the patients home medicines and have made adjustments as needed   Social Determinants of Health:  Lives at home with wife, poor mobility  at baseline      Final diagnoses:  Pneumonia of left lower lobe due to infectious organism  Generalized weakness  Hypokalemia  Hypomagnesemia    ED Discharge Orders     None          Melvenia Motto, MD 02/21/24 1910    Melvenia Motto, MD 02/21/24 1941

## 2024-02-21 NOTE — ED Notes (Signed)
 CCMD called.

## 2024-02-21 NOTE — Progress Notes (Signed)
 Office Visit Note   Patient: Levi [name]           Date of Birth: 24-May-1946           MRN: [id number] Visit Date: 02/18/2024 Requested by: Gerome Maurilio HERO, PA-C [address] [place],  [place] [address] PCP: Jerrell Cleatus Ned, MD  Subjective: Chief Complaint  Patient presents with   Right Shoulder - Pain    HPI: Levi [name] is a 78 y.o. male who presents to the office reporting right shoulder pain for 5 years.  Chronic pain.  No known injury.  Patient is right-hand dominant.  Really has pain with any movement and is very weak.  Does have neuropathy and atrial fibrillation.  Takes prednisone  daily as well as Eliquis  daily.  Hard for him to transfer.  Has had no relief from injections..                ROS: All systems reviewed are negative as they relate to the chief complaint within the history of present illness.  Patient denies fevers or chills.  Assessment & Plan: Visit Diagnoses:  1. Chronic right shoulder pain     Plan: Impression is end-stage right shoulder rotator cuff arthropathy with thin appearing acromion on plain radiographs.  The acetabular rotation of the acromion may be so excessive that it would be risky to do a reverse replacement out of concern for the acromion.  In addition the patient has multiple multiple medical comorbidities including prednisone  use and blood thinner use which would make his surgery in the moderate risk category at least.  Plan at this time is think that CT scan just to evaluate the bony anatomy and better in terms of the acromion.  Follow-up after that study.  I did discuss with him at length the risky nature of elective joint replacement with his medical comorbidities.  Follow-up after that CT scan  Follow-Up Instructions: No follow-ups on file.   Orders:  Orders Placed This Encounter  Procedures   CT SHOULDER RIGHT WO CONTRAST   No orders of the defined types were placed in this encounter.     Procedures: No procedures  performed   Clinical Data: No additional findings.  Objective: Vital Signs: There were no vitals taken for this visit.  Physical Exam:  Constitutional: Patient appears well-developed HEENT:  Head: Normocephalic Eyes:EOM are normal Neck: Normal range of motion Cardiovascular: Normal rate Pulmonary/chest: Effort normal Neurologic: Patient is alert Skin: Skin is warm Psychiatric: Patient has normal mood and affect  Ortho Exam: Ortho exam demonstrates functional deltoid.  Multiple bruising on both arms.  Radial pulses intact.  Deltoid is functional.  Has weak external rotation and internal rotation on rotator cuff muscle testing.  Active forward flexion and abduction both about 30 to 40 degrees.  On the right-hand side.  Specialty Comments:  No specialty comments available.  Imaging: No results found.   PMFS History: Patient Active Problem List   Diagnosis Date Noted   Sacral wound, initial encounter 02/20/2024   Primary osteoarthritis, right shoulder 07/05/2022   BPH (benign prostatic hyperplasia) 02/21/2022   Acquired absence of right leg below knee (HCC) 01/22/2022   Peripheral neuropathy 02/22/2021   Chronic systolic heart failure (HCC) 02/22/2021   COPD (chronic obstructive pulmonary disease) (HCC) 03/21/2020   GERD (gastroesophageal reflux disease) status post Nissen fundoplication 12/03/2017   Hyperlipidemia    Paroxysmal atrial fibrillation (HCC) 05/11/2012   Asthma, moderate persistent 03/06/2009   Past Medical History:  Diagnosis Date   Agent orange exposure 1970's   takes Imdur ,Diltiazem , and Atacand  daily   Agent orange exposure    ALLERGIC RHINITIS    Allergy     seasonal allergies   Anemia    iron  deficiency   Arthritis    Asthma    uses inhaler and nebulizers   Atrial fibrillation (HCC)    Bruises easily    d/t prednisone  daily   Cataract    Cellulitis 03/22/2020   Chest wall pain 09/21/2019   L scapula, reported 4/27 21/     CHF (congestive  heart failure) (HCC)    dx-on meds to treat   Chronic bronchitis (HCC)    used to get it q yr; last time ?2008 (04/28/2012)   Clotting disorder 2019   On blood thinner when I bleed it is for a consideral amount of time. This is tegardless of size of wound.   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 2010   History of total right hip arthroplasty 07/31/2015   Hyperlipidemia    takes Lipitor daily; pt. states he takes a preventive   Hypothyroidism    Joint pain    Joint swelling    Leukocytosis    Lumbar stenosis status post T12-S1 fusion 04/07/2017   MSSA bacteremia 03/19/2023   Nausea and vomiting 11/12/2022   Neuromuscular disorder (HCC)    bilat legs, and bilat arms   Neuropathy    Osteoarthritis of left knee 01/10/2012   Osteoporosis 2012   PAF (paroxysmal atrial fibrillation) (HCC)    a. Dx 04/2012, CHA2DS2VASc = 1 (age);  b. 04/2012 Echo: EF 40-50%, mild MR.   Personal history of colonic adenomas 02/09/2013   Pneumonia    Primary osteoarthritis of right knee 06/17/2014   Sepsis (HCC) 03/18/2023   Shortness of breath dyspnea    Thyroid  disease    no taking meds-caused dizziness    Family History  Problem Relation Age of Onset   Diabetes Mother    Heart disease Father        Died 57, MI   Cancer Father    Hyperlipidemia Father    Stroke  Father    Prostate cancer Brother    Colon polyps Brother    Diabetes Brother    Colon polyps Brother    Asthma Brother    COPD Brother    Colon cancer Brother        Dx age 73   Cancer Brother    Colon cancer Other    Esophageal cancer Neg Hx    Rectal cancer Neg Hx    Stomach cancer Neg Hx    Pancreatic cancer Neg Hx    Kidney disease Neg Hx  Liver disease Neg Hx    Neuropathy Neg Hx     Past Surgical History:  Procedure Laterality Date   38 HOUR PH STUDY N/A 05/30/2021   Procedure: 24 HOUR PH STUDY;  Surgeon: San Sandor GAILS, DO;  Location: [facility];  Service: Gastroenterology;  Laterality: N/A;   AMPUTATION Right 11/14/2023   Procedure: AMPUTATION BELOW KNEE;  Surgeon: Harden Jerona GAILS, MD;  Location: [facility] OR;  Service: Orthopedics;  Laterality: Right;   AMPUTATION TOE Bilateral 08/13/2023   Procedure: LEFT SECOND TOE AMPUTATION AND RIGHT FIFTH TOE AMPUTATION;  Surgeon: Malvin Marsa FALCON, DPM;  Location: [facility] OR;  Service: Orthopedics/Podiatry;  Laterality: Bilateral;   ANTERIOR CERVICAL DECOMP/DISCECTOMY FUSION N/A 07/21/2012   Procedure: ANTERIOR CERVICAL DECOMPRESSION/DISCECTOMY FUSION 2 LEVELS;  Surgeon: Victory Gens, MD;  Location: [facility];  Service: Neurosurgery;  Laterality: N/A;  C4-5 C5-6 Anterior cervical decompression/diskectomy/fusion   ANTERIOR LAT LUMBAR FUSION N/A 04/07/2017   Procedure: Thoracic twelve-Lumbar one Anterolateral decompression/fusion;  Surgeon: Gens Victory, MD;  Location: [facility] OR;  Service: Neurosurgery;  Laterality: N/A;   ARTHRODESIS FOOT WITH WEIL OSTEOTOMY Left 11/07/2022   Procedure: LEFT THIRD METATARSOPHALANGEAL CAPSULOTOMY, WEIL OSTEOTOMY;  Surgeon: Elsa Lonni SAUNDERS, MD;  Location: [facility] OR;  Service: Orthopedics;  Laterality: Left;  LENGTH OF SURGERY: 60 MINUTES   BACK SURGERY     x 3   BELOW KNEE LEG AMPUTATION Right    June 2025   BONE EXOSTOSIS EXCISION Right 10/16/2023   Procedure: EXCISION, EXOSTOSIS;  Surgeon:  Janit Thresa HERO, DPM;  Location: [facility];  Service: Orthopedics/Podiatry;  Laterality: Right;   CARDIAC CATHETERIZATION  11/2008   Dr. Jeffrie - 20% calcified non flow limiting left main, 50% EF apical hypokinesis   CARPAL TUNNEL RELEASE Bilateral    CERVICAL DISC ARTHROPLASTY N/A 04/07/2017   Procedure: Cervical six-seven Disc arthroplasty;  Surgeon: Gens Victory, MD;  Location[facility];  Service: Neurosurgery;  Laterality: N/A;   CHEST TUBE INSERTION Left 04/11/2017   Procedure: CHEST TUBE INSERTION;  Surgeon: Fleeta Hanford Coy, MD;  Location: [facility] OR;  Service: Thoracic;  Laterality: Left;   CHOLECYSTECTOMY     COLONOSCOPY  2016   CG-MAC-miralax (good)servere TICS/TA x 2   ESOPHAGEAL MANOMETRY N/A 05/30/2021   Procedure: ESOPHAGEAL MANOMETRY (EM);  Surgeon: San Sandor GAILS, DO;  Location: [facility];  Service: Gastroenterology;  Laterality: N/A;  ph impedence   ESOPHAGOGASTRODUODENOSCOPY     EYE SURGERY Bilateral 2011   steroidal encapsulation (04/28/2012)   FOOT SURGERY Right    x 2   JOINT REPLACEMENT     Many surgeries including 5 joit replacements   KNEE ARTHROSCOPY Bilateral    LATERAL / POSTERIOR COMBINED FUSION LUMBAR SPINE  2012   LEFT ATRIAL APPENDAGE OCCLUSION N/A 02/22/2021   Procedure: LEFT ATRIAL APPENDAGE OCCLUSION;  Surgeon: Cindie Ole DASEN, MD;  [facility]: [facility];  Service: Cardiovascular;  Laterality: N/A;   NEUROPLASTY / TRANSPOSITION ULNAR NERVE AT ELBOW Bilateral    POLYPECTOMY  2016   severe TICS/TA x 2   POSTERIOR CERVICAL LAMINECTOMY WITH MET- RX Right 06/11/2021   Procedure: Right Cervical six-seven  Laminectomy and foraminotomy with metrex;  Surgeon: Gens Victory, MD;  Location: [facility] OR;  Service: Neurosurgery;  Laterality: Right;   REFRACTIVE SURGERY Left 07/07/2023   laser surgery but still has floaters   REVERSE SHOULDER ARTHROPLASTY  04/28/2012   Procedure: REVERSE SHOULDER ARTHROPLASTY;  Surgeon: Eva Elsie Herring, MD;  Location[facility]  OR;  Service: Orthopedics;  Laterality:  Left;  Left shouder reverse total shoulder arthroplasty   SHOULDER SURGERY     SPINE SURGERY     I have had 7 spinal surgeries   TEE WITHOUT CARDIOVERSION N/A 02/22/2021   Procedure: TRANSESOPHAGEAL ECHOCARDIOGRAM (TEE);  Surgeon: Cindie Ole DASEN, MD;  Location: [facility] INVASIVE CV LAB;  Service: Cardiovascular;  Laterality: N/A;   TENDON REPAIR Right 08/07/2020   Procedure: RIGHT HAND LIGAMENT RECONSTRUCTION AND TENDON INTERPOSITION;  Surgeon: Yvone Rush, MD;  Location: [facility];  Service: Orthopedics;  Laterality: Right;   TONSILLECTOMY AND ADENOIDECTOMY  1954   TOTAL HIP ARTHROPLASTY Left 2011   left (04/28/2012)   TOTAL HIP ARTHROPLASTY Right 07/31/2015   Procedure: TOTAL HIP ARTHROPLASTY ANTERIOR APPROACH;  Surgeon: Rush Yvone, MD;  Location: [facility] OR;  Service: Orthopedics;  Laterality: Right;   TOTAL KNEE ARTHROPLASTY  01/10/2012   Procedure: TOTAL KNEE ARTHROPLASTY;  Surgeon: Rush LITTIE Yvone, MD;  Location: [facility] OR;  Service: Orthopedics;;  left total knee arthroplasty   TOTAL KNEE ARTHROPLASTY Right 06/17/2014   Procedure: TOTAL KNEE ARTHROPLASTY;  Surgeon: Rush LITTIE Yvone, MD;  Location: [facility] OR;  Service: Orthopedics;  Laterality: Right;   toupet fundolplication  11/01/2021   TRANSESOPHAGEAL ECHOCARDIOGRAM (CATH LAB) N/A 03/24/2023   Procedure: TRANSESOPHAGEAL ECHOCARDIOGRAM;  Surgeon: Mona Vinie BROCKS, MD;  Location: [facility];  Service: Cardiovascular;  Laterality: N/A;   Social History   Occupational History   Occupation: Retired    Comment: Nurse, 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

## 2024-02-21 NOTE — Progress Notes (Signed)
 Pharmacy Antibiotic Note  Levi [name] is a 78 y.o. male admitted on 02/21/2024 with pneumonia.  Pharmacy has been consulted for vancomycin  dosing.  Presenting with progressive weakness and fatigue w/ pain at R leg stump. Received antibiotics in ED including vancomycin  1750 mg IV x1 and cefepime  2g IV. WBC 13.7, LA 1.1, Scr 1.12 (CrCl 65 mL/min). CXR with LLL PNA.   Plan: Order vancomycin  1750 mg IV every 24 hours (estAUC 481, Vd 0.72) Monitor renal fx, cx results, clinical pic, and vanc levels as appropriate   Height: 6' 2 (188 cm) Weight: 86.2 kg (190 lb) IBW/kg (Calculated) : 82.2  Temp (24hrs), Avg:98.2 F (36.8 C), Min:98.2 F (36.8 C), Max:98.2 F (36.8 C)  Recent Labs  Lab 02/21/24 1705 02/21/24 1720 02/21/24 2027  WBC 13.7*  --   --   CREATININE 1.12  --   --   LATICACIDVEN  --  2.7* 1.1    Estimated Creatinine Clearance: 65.2 mL/min (by C-G formula based on SCr of 1.12 mg/dL).    Allergies  Allergen Reactions   Levaquin  [Levofloxacin ] Itching    Tolerated ciprofloxacin  in 2022.    Antimicrobials this admission: Azithro + metronidazole  9/27 x1 Cefepime  9/27 >>  Vancomycin  9/27 >>   Dose adjustments this admission: N/A  Microbiology results: 9/27 BCx: sent 9/27 COVID/Flu PCR: neg   Thank you for allowing pharmacy to participate in this patient's care,  Suzen Sour, PharmD, BCCCP Clinical Pharmacist  Phone: [phone] 02/21/2024 9:11 PM  Please check AMION for all [facility] phone numbers After 10:00 PM, call Main Pharmacy [phone]

## 2024-02-21 NOTE — ED Triage Notes (Addendum)
 Pt bibems from home. Complaints of worsening weakness x 1week. URI x 1 month with productive cough. Sacral wound stage 1. Fever x 2days (100.4 oral).  650mg  of tylenol  given by EMS at 1641.

## 2024-02-21 NOTE — H&P (Signed)
 History and Physical    Levi [name] FMW:[id number] DOB: 27-Jun-1945 DOA: 02/21/2024  PCP: Jerrell Cleatus Ned, MD  Patient coming from: Home  Chief Complaint: Generalized weakness  HPI: Levi [name] is a 78 y.o. male with medical history significant of hypertension, hyperlipidemia, GERD, paroxysmal A-fib on Eliquis , superficial vein thrombosis, iron  deficiency anemia, HFmrEF, depression, COPD, BPH, right foot osteomyelitis status post right BKA, stage I sacral pressure ulcer, peripheral neuropathy.  Last hospital admission 8/1-8//2025 for mild cellulitis of BKA stump and HCAP.  Patient presents to the ED today via EMS with complaints of generalized weakness, fever, and cough.  Temperature 100.4 F oral and he was given Tylenol  by EMS.  Also noted to have soft blood pressures in the range of 90s over 60s with EMS.  Patient endorsed some mild pain in the posterior aspect of his right BKA stump.  His stage I sacral wound did not appear infected.  Patient noted to have a noninfected skin tear to his left thigh, nonhealing wound to the medial aspect of his right stump, and mild swelling of his left great toe.  He was recently treated with antibiotics for URI symptoms.  Labs notable for WBC count 13.7, hemoglobin 9.2 (previously ranging between 10.8-12.2 on labs last month), MCV 89.5, potassium 3.0, magnesium  1.6, creatinine 1.1 (baseline 0.6-0.8), calcium  8.2, albumin 2.1, normal LFTs, COVID/influenza/RSV PCR negative, UA pending, blood cultures in process, lactic acid 2.7.  Chest x-ray showing left lower lobe consolidation concerning for pneumonia.  X-ray of left foot negative for acute fracture or evidence of osteomyelitis.  X-ray of right knee negative for signs of osteomyelitis at amputation stump site.  Patient was given oral potassium, azithromycin , cefepime , metronidazole , vancomycin , 2 L LR, and IV mag in the ED.  [facility] called to admit.    History provided by patient and his wife at bedside.   Patient is endorsing generalized weakness.  Wife states patient has been progressively more weak for the past few weeks and barely has the strength to transfer from his bed to a wheelchair.  He was diagnosed with an upper respiratory infection at the end of August/beginning of this month and treated with Augmentin  but his cough never went away.  For the past few days he is having fevers.  Patient denies shortness of breath or chest pain.  Denies change in appetite.  Denies vomiting, abdominal pain, diarrhea, or any urinary symptoms.  Denies hematemesis, hematochezia, melena, hematuria, or any other obvious bleeding.  Review of Systems:  Review of Systems  All other systems reviewed and are negative.   Past Medical History:  Diagnosis Date   Agent orange exposure 1970's   takes Imdur ,Diltiazem , and Atacand  daily   Agent orange exposure    ALLERGIC RHINITIS    Allergy     seasonal allergies   Anemia    iron  deficiency   Arthritis    Asthma    uses inhaler and nebulizers   Atrial fibrillation (HCC)    Bruises easily    d/t prednisone  daily   Cataract    Cellulitis 03/22/2020   Chest wall pain 09/21/2019   L scapula, reported 4/27 21/     CHF (congestive heart failure) (HCC)    dx-on meds to treat   Chronic bronchitis (HCC)    used to get it q yr; last time ?2008 (04/28/2012)   Clotting disorder 2019   On blood thinner when I bleed it is for a consideral amount of time. This is tegardless of  size of wound.   COPD (chronic obstructive pulmonary disease) (HCC)    agent orange exposure   Degenerative disk disease    everywhere (04/28/2012)   Depression due to physical illness 12/15/2023   Diverticulitis    Dysrhythmia    afib   Elevated uric acid in blood    takes Allopurinol  daily   Emphysema of lung (HCC)    Enlarged prostate    but not on any meds   Essential (primary) hypertension 11/15/2020   GERD (gastroesophageal reflux disease)    takes Protonix  daily   H/O hiatal  hernia    Headache    History of colonic polyps 02/09/2013   01/2013 - 3 adenomas max 8 mm  01/2015 2 diminutive adenomas - repeat colon 2021     History of colonoscopy    History of pneumonia    a. 2010   History of total right hip arthroplasty 07/31/2015   Hyperlipidemia    takes Lipitor daily; pt. states he takes a preventive   Hypothyroidism    Joint pain    Joint swelling    Leukocytosis    Lumbar stenosis status post T12-S1 fusion 04/07/2017   MSSA bacteremia 03/19/2023   Nausea and vomiting 11/12/2022   Neuromuscular disorder (HCC)    bilat legs, and bilat arms   Neuropathy    Osteoarthritis of left knee 01/10/2012   Osteoporosis 2012   PAF (paroxysmal atrial fibrillation) (HCC)    a. Dx 04/2012, CHA2DS2VASc = 1 (age);  b. 04/2012 Echo: EF 40-50%, mild MR.   Personal history of colonic adenomas 02/09/2013   Pneumonia    Primary osteoarthritis of right knee 06/17/2014   Sepsis (HCC) 03/18/2023   Shortness of breath dyspnea    Thyroid  disease    no taking meds-caused dizziness    Past Surgical History:  Procedure Laterality Date   40 HOUR PH STUDY N/A 05/30/2021   Procedure: 24 HOUR PH STUDY;  Surgeon: San Sandor GAILS, DO;  Location: [facility];  Service: Gastroenterology;  Laterality: N/A;   AMPUTATION Right 11/14/2023   Procedure: AMPUTATION BELOW KNEE;  Surgeon: Harden Jerona GAILS, MD;  Location: [facility] OR;  Service: Orthopedics;  Laterality: Right;   AMPUTATION TOE Bilateral 08/13/2023   Procedure: LEFT SECOND TOE AMPUTATION AND RIGHT FIFTH TOE AMPUTATION;  Surgeon: Malvin Marsa FALCON, DPM;  Location: [facility] OR;  Service: Orthopedics/Podiatry;  Laterality: Bilateral;   ANTERIOR CERVICAL DECOMP/DISCECTOMY FUSION N/A 07/21/2012   Procedure: ANTERIOR CERVICAL DECOMPRESSION/DISCECTOMY FUSION 2 LEVELS;  Surgeon: Victory Gens, MD;  Location: [facility];  Service: Neurosurgery;  Laterality: N/A;  C4-5 C5-6 Anterior cervical decompression/diskectomy/fusion   ANTERIOR LAT  LUMBAR FUSION N/A 04/07/2017   Procedure: Thoracic twelve-Lumbar one Anterolateral decompression/fusion;  Surgeon: Gens Victory, MD;  Location: [facility] OR;  Service: Neurosurgery;  Laterality: N/A;   ARTHRODESIS FOOT WITH WEIL OSTEOTOMY Left 11/07/2022   Procedure: LEFT THIRD METATARSOPHALANGEAL CAPSULOTOMY, WEIL OSTEOTOMY;  Surgeon: Elsa Lonni SAUNDERS, MD;  Location: [facility] OR;  Service: Orthopedics;  Laterality: Left;  LENGTH OF SURGERY: 60 MINUTES   BACK SURGERY     x 3   BELOW KNEE LEG AMPUTATION Right    June 2025   BONE EXOSTOSIS EXCISION Right 10/16/2023   Procedure: EXCISION, EXOSTOSIS;  Surgeon: Janit Thresa HERO, DPM;  Location: [facility];  Service: Orthopedics/Podiatry;  Laterality: Right;   CARDIAC CATHETERIZATION  11/2008   Dr. Jeffrie - 20% calcified non flow limiting left main, 50% EF apical hypokinesis   CARPAL TUNNEL RELEASE Bilateral  CERVICAL DISC ARTHROPLASTY N/A 04/07/2017   Procedure: Cervical six-seven Disc arthroplasty;  Surgeon: Colon Shove, MD;  Location: [facility] OR;  Service: Neurosurgery;  Laterality: N/A;   CHEST TUBE INSERTION Left 04/11/2017   Procedure: CHEST TUBE INSERTION;  Surgeon: Fleeta Hanford Coy, MD;  Location: [facility] OR;  Service: Thoracic;  Laterality: Left;   CHOLECYSTECTOMY     COLONOSCOPY  2016   CG-MAC-miralax (good)servere TICS/TA x 2   ESOPHAGEAL MANOMETRY N/A 05/30/2021   Procedure: ESOPHAGEAL MANOMETRY (EM);  Surgeon: San Sandor GAILS, DO;  Location: [facility];  Service: Gastroenterology;  Laterality: N/A;  ph impedence   ESOPHAGOGASTRODUODENOSCOPY     EYE SURGERY Bilateral 2011   steroidal encapsulation (04/28/2012)   FOOT SURGERY Right    x 2   JOINT REPLACEMENT     Many surgeries including 5 joit replacements   KNEE ARTHROSCOPY Bilateral    LATERAL / POSTERIOR COMBINED FUSION LUMBAR SPINE  2012   LEFT ATRIAL APPENDAGE OCCLUSION N/A 02/22/2021   Procedure: LEFT ATRIAL APPENDAGE OCCLUSION;  Surgeon: Cindie Ole DASEN, MD;  Location: [facility];  Service: Cardiovascular;  Laterality: N/A;   NEUROPLASTY / TRANSPOSITION ULNAR NERVE AT ELBOW Bilateral    POLYPECTOMY  2016   severe TICS/TA x 2   POSTERIOR CERVICAL LAMINECTOMY WITH MET- RX Right 06/11/2021   Procedure: Right Cervical six-seven  Laminectomy and foraminotomy with metrex;  Surgeon: Colon Shove, MD;  Location: [facility] OR;  Service: Neurosurgery;  Laterality: Right;   REFRACTIVE SURGERY Left 07/07/2023   laser surgery but still has floaters   REVERSE SHOULDER ARTHROPLASTY  04/28/2012   Procedure: REVERSE SHOULDER ARTHROPLASTY;  Surgeon: Eva Elsie Herring, MD;  Location[facility] OR;  Service: Orthopedics;  Laterality: Left;  Left shouder reverse total shoulder arthroplasty   SHOULDER SURGERY     SPINE SURGERY     I have had 7 spinal surgeries   TEE WITHOUT CARDIOVERSION N/A 02/22/2021   Procedure: TRANSESOPHAGEAL ECHOCARDIOGRAM (TEE);  Surgeon: Cindie Ole DASEN, MD;  Location: [facility] INVASIVE CV LAB;  Service: Cardiovascular;  Laterality: N/A;   TENDON REPAIR Right 08/07/2020   Procedure: RIGHT HAND LIGAMENT RECONSTRUCTION AND TENDON INTERPOSITION;  Surgeon: Yvone Rush, MD;  Location: [facility];  Service: Orthopedics;  Laterality: Right;   TONSILLECTOMY AND ADENOIDECTOMY  1954   TOTAL HIP ARTHROPLASTY Left 2011   left (04/28/2012)   TOTAL HIP ARTHROPLASTY Right 07/31/2015   Procedure: TOTAL HIP ARTHROPLASTY ANTERIOR APPROACH;  Surgeon: Rush Yvone, MD;  Location: [facility] OR;  Service: Orthopedics;  Laterality: Right;   TOTAL KNEE ARTHROPLASTY  01/10/2012   Procedure: TOTAL KNEE ARTHROPLASTY;  Surgeon: Rush LITTIE Yvone, MD;  Location: [facility] OR;  Service: Orthopedics;;  left total knee arthroplasty   TOTAL KNEE ARTHROPLASTY Right 06/17/2014   Procedure: TOTAL KNEE ARTHROPLASTY;  Surgeon: Rush LITTIE Yvone, MD;  Location: [facility] OR;  Service: Orthopedics;  Laterality: Right;   toupet fundolplication  11/01/2021   TRANSESOPHAGEAL ECHOCARDIOGRAM (CATH LAB) N/A 03/24/2023    Procedure: TRANSESOPHAGEAL ECHOCARDIOGRAM;  Surgeon: Mona Vinie BROCKS, MD;  Location: [facility];  Service: Cardiovascular;  Laterality: N/A;     reports that he has never smoked. He has never used smokeless tobacco. He reports that he does not drink alcohol and does not use drugs.  Allergies  Allergen Reactions   Levaquin  [Levofloxacin ] Itching    Tolerated ciprofloxacin  in 2022.    Family History  Problem Relation Age of Onset   Diabetes Mother    Heart disease Father  Died 69, MI   Cancer Father    Hyperlipidemia Father    Stroke Father    Prostate cancer Brother    Colon polyps Brother    Diabetes Brother    Colon polyps Brother    Asthma Brother    COPD Brother    Colon cancer Brother        Dx age 78   Cancer Brother    Colon cancer Other    Esophageal cancer Neg Hx    Rectal cancer Neg Hx    Stomach cancer Neg Hx    Pancreatic cancer Neg Hx    Kidney disease Neg Hx    Liver disease Neg Hx    Neuropathy Neg Hx     Prior to Admission medications   Medication Sig Start Date End Date Taking? Authorizing Provider  albuterol  (VENTOLIN  HFA) 108 (90 Base) MCG/ACT inhaler INHALE 2 PUFFS BY MOUTH EVERY 4 HOURS AS NEEDED FOR WHEEZE OR FOR SHORTNESS OF BREATH 01/19/24   Neysa Rama D, MD  apixaban  (ELIQUIS ) 5 MG TABS tablet Take 1 tablet (5 mg total) by mouth 2 (two) times daily. 02/02/24   Jeffrie Oneil BROCKS, MD  atorvastatin  (LIPITOR) 20 MG tablet Take 1 tablet (20 mg total) by mouth daily. 12/03/23   Jeffrie Oneil BROCKS, MD  cyclobenzaprine  (FLEXERIL ) 10 MG tablet TAKE 1 TABLET BY MOUTH THREE TIMES A DAY AS NEEDED FOR MUSCLE SPASMS 12/31/23   Curtis Debby PARAS, MD  diltiazem  (CARDIZEM  CD) 180 MG 24 hr capsule Take 1 capsule (180 mg total) by mouth daily. 08/25/23   Jeffrie Oneil BROCKS, MD  DULoxetine  (CYMBALTA ) 30 MG capsule TAKE 1 CAPSULE BY MOUTH EVERY DAY 01/06/24   Curtis Debby PARAS, MD  ferrous gluconate  (FERGON) 324 MG tablet Take 1 tablet (324 mg total) by mouth  daily. 12/05/23   Love, Sharlet RAMAN, PA-C  ipratropium-albuterol  (DUONEB) 0.5-2.5 (3) MG/3ML SOLN 1 NEBULE EVERY 6 HOURS AS NEEDED. **J45.3** 03/22/20   Young, Rama D, MD  isosorbide  mononitrate (IMDUR ) 30 MG 24 hr tablet Take 1 tablet (30 mg total) by mouth daily. 12/18/23   Jeffrie Oneil BROCKS, MD  melatonin 5 MG TABS Take 1 tablet (5 mg total) by mouth at bedtime as needed. 12/05/23   Love, Sharlet RAMAN, PA-C  Olodaterol HCl (STRIVERDI RESPIMAT) 2.5 MCG/ACT AERS INHALE 2 PUFFS BY MOUTH INTO THE LUNGS DAILY 03/10/23   Neysa Rama D, MD  pantoprazole  (PROTONIX ) 40 MG tablet Take 1 tablet (40 mg total) by mouth 2 (two) times daily before a meal. 12/05/23   Love, Sharlet RAMAN, PA-C  predniSONE  (DELTASONE ) 5 MG tablet 3 tabs daily 12/25/23   Neysa Rama D, MD  pregabalin  (LYRICA ) 150 MG capsule Take 1 capsule (150 mg total) by mouth 2 (two) times daily. 01/15/24   Urbano Albright, MD  tamsulosin  (FLOMAX ) 0.4 MG CAPS capsule Take 1 capsule (0.4 mg total) by mouth at bedtime. 12/05/23   Maurice Sharlet RAMAN, PA-C    Physical Exam: Vitals:   02/21/24 1800 02/21/24 1809 02/21/24 1815 02/21/24 1830  BP: 124/78  110/64 115/60  Pulse: 80  73 66  Resp: (!) 21  20 16   Temp:  98.2 F (36.8 C)    TempSrc:  Rectal    SpO2: 93%  93% 93%  Weight:      Height:        Physical Exam Vitals reviewed.  Constitutional:      General: He is not in acute distress. HENT:  Head: Normocephalic and atraumatic.  Eyes:     Extraocular Movements: Extraocular movements intact.  Cardiovascular:     Rate and Rhythm: Normal rate and regular rhythm.     Heart sounds: Murmur heard.  Pulmonary:     Effort: Pulmonary effort is normal. No respiratory distress.     Breath sounds: No wheezing.  Abdominal:     General: Bowel sounds are normal. There is no distension.     Palpations: Abdomen is soft.     Tenderness: There is no abdominal tenderness. There is no guarding.  Musculoskeletal:     Cervical back: Normal range of motion.      Left lower leg: No edema.     Comments: Status post right BKA.  No obvious signs of infection at stump site. Left great toe appears slightly swollen but no obvious signs of infection.  Skin:    General: Skin is warm and dry.  Neurological:     General: No focal deficit present.     Mental Status: He is alert and oriented to person, place, and time.     Cranial Nerves: No cranial nerve deficit.     Sensory: No sensory deficit.     Motor: No weakness.     Labs on Admission: I have personally reviewed following labs and imaging studies  CBC: Recent Labs  Lab 02/21/24 1705  WBC 13.7*  NEUTROABS 11.9*  HGB 9.2*  HCT 29.1*  MCV 89.5  PLT 157   Basic Metabolic Panel: Recent Labs  Lab 02/21/24 1705  NA 138  K 3.0*  CL 101  CO2 24  GLUCOSE 157*  BUN 20  CREATININE 1.12  CALCIUM  8.2*  MG 1.6*   GFR: Estimated Creatinine Clearance: 65.2 mL/min (by C-G formula based on SCr of 1.12 mg/dL). Liver Function Tests: Recent Labs  Lab 02/21/24 1705  AST 24  ALT 20  ALKPHOS 74  BILITOT 1.1  PROT 4.9*  ALBUMIN 2.1*   No results for input(s): LIPASE, AMYLASE in the last 168 hours. No results for input(s): AMMONIA in the last 168 hours. Coagulation Profile: Recent Labs  Lab 02/21/24 1705  INR 1.7*   Cardiac Enzymes: No results for input(s): CKTOTAL, CKMB, CKMBINDEX, TROPONINI in the last 168 hours. BNP (last 3 results) No results for input(s): PROBNP in the last 8760 hours. HbA1C: No results for input(s): HGBA1C in the last 72 hours. CBG: Recent Labs  Lab 02/21/24 1715  GLUCAP 158*   Lipid Profile: No results for input(s): CHOL, HDL, LDLCALC, TRIG, CHOLHDL, LDLDIRECT in the last 72 hours. Thyroid  Function Tests: No results for input(s): TSH, T4TOTAL, FREET4, T3FREE, THYROIDAB in the last 72 hours. Anemia Panel: No results for input(s): VITAMINB12, FOLATE, FERRITIN, TIBC, IRON , RETICCTPCT in the last 72  hours. Urine analysis:    Component Value Date/Time   COLORURINE YELLOW 12/26/2023 1342   APPEARANCEUR CLEAR 12/26/2023 1342   APPEARANCEUR Cloudy (A) 11/03/2023 1113   LABSPEC 1.044 (H) 12/26/2023 1342   PHURINE 6.0 12/26/2023 1342   GLUCOSEU NEGATIVE 12/26/2023 1342   HGBUR NEGATIVE 12/26/2023 1342   BILIRUBINUR NEGATIVE 12/26/2023 1342   BILIRUBINUR Negative 11/03/2023 1113   KETONESUR NEGATIVE 12/26/2023 1342   PROTEINUR NEGATIVE 12/26/2023 1342   UROBILINOGEN 0.2 05/02/2022 0916   UROBILINOGEN 0.2 04/21/2012 0936   NITRITE NEGATIVE 12/26/2023 1342   LEUKOCYTESUR NEGATIVE 12/26/2023 1342    Radiological Exams on Admission: DG Knee 2 Views Right Result Date: 02/21/2024 EXAM: 1 or 2 VIEW(S) XRAY OF THE RIGHT KNEE  02/21/2024 06:23:00 PM COMPARISON: 12/26/2023. CLINICAL HISTORY: stump pain. Reason for exam: stump pain; Per triage notes: Pt bibems from home. Complaints of worsening weakness x 1week. URI x 1 month with productive cough. Sacral wound stage 1. Fever x 2days (100.4 oral). FINDINGS: BONES AND JOINTS: No evidence of acute fracture or osteomyelitis. 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 Below knee amputation. Chronic callus formation at the distal fibular stump. No joint dislocation. No significant joint effusion. SOFT TISSUES: Vascular calcifications. The soft tissues are otherwise unremarkable. IMPRESSION: 1. No acute findings. Electronically signed by: Norman Gatlin MD 02/21/2024 06:46 PM EDT RP Workstation: [id number]   DG Foot 2 Views Left Result Date: 02/21/2024 EXAM: 2 VIEW(S) XRAY OF THE LEFT FOOT 02/21/2024 06:23:00 PM COMPARISON: 09/09/2023 CLINICAL HISTORY: stump pain. Reason for exam: stump pain; Per triage notes: Pt bibems from home. Complaints of worsening weakness x 1week. URI x 1 month with productive cough. Sacral wound stage 1. Fever x 2days (100.4 oral). FINDINGS: BONES AND JOINTS: Screw fixation of the distal third metatarsal noted. No acute fracture or  radiographic evidence of osteomyelitis. Chronic fracture deformity of the third metatarsal neck. No joint dislocation. Amputation of the 2nd digit at the MTP joint. 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 SOFT TISSUES: Mild soft tissue swelling of the forefoot. Vascular calcifications within the soft tissues. IMPRESSION: 1. No acute fracture or radiographic evidence of osteomyelitis Electronically signed by: Norman Gatlin MD 02/21/2024 06:43 PM EDT RP Workstation: [id number]   DG Chest Port 1 View Result Date: 02/21/2024 CLINICAL DATA:  Weakness for 1 week, questionable sepsis EXAM: PORTABLE CHEST 1 VIEW COMPARISON:  12/26/2023 FINDINGS: Single frontal view of the chest demonstrates a stable cardiac silhouette. Increased density in the retrocardiac region may reflect airspace disease or atelectasis. No large effusion or pneumothorax. No acute bony abnormalities. IMPRESSION: 1. Left lower lobe consolidation consistent with atelectasis or airspace disease. Electronically Signed   By: Ozell Daring M.D.   On: 02/21/2024 17:50    EKG: Independently reviewed. Sinus rhythm, no significant change compared to previous tracings.   Assessment and Plan  Sepsis secondary to left lower lobe pneumonia Presenting with cough, fever, leukocytosis, lactic acidosis, and soft blood pressures.  Chest x-ray showing left lower lobe consolidation concerning for pneumonia.  COVID/influenza/RSV PCR negative.  Patient is not hypoxic.  Will continue antibiotic coverage with vancomycin  and cefepime  at this time.  Antitussive  as needed.  Blood pressure remains soft, continue IV fluid resuscitation.  Trend lactate and WBC count.  Follow-up blood cultures.  Mild AKI Likely prerenal in etiology in the setting of sepsis.  Creatinine 1.1 (baseline 0.6-0.8).  Continue IV fluids and monitor renal function.  Avoid nephrotoxic agents.  Mild hypokalemia/hypomagnesemia Continue to monitor labs and replace electrolytes as  needed.  Generalized weakness PT/OT eval, fall precautions  Chronic HFmrEF TEE done in October 2024 showing EF 40 to 45%, mild mitral regurgitation, and trivial to mild aortic regurgitation.  No signs of volume overload at this time.  Hypertension Hold antihypertensives at this time in the setting of sepsis/soft blood pressure.  Paroxysmal A-fib Currently in sinus rhythm.  Continue Eliquis .  Holding Cardizem  at this time due to soft blood pressure.  Chronic iron  deficiency anemia Hemoglobin 9.2 (previously ranging between 10.8-12.2 on labs last month).  Patient is on chronic anticoagulation but not endorsing any symptoms of GI bleed or any other bleeding.  Monitor H&H.  Continue home iron  supplement.  Stage I sacral pressure wound Wound care.  Hyperlipidemia Continue Lipitor.  GERD Continue Protonix .  COPD Stable, no signs of acute exacerbation.  He is on chronic steroids, continue prednisone .  Continue home inhalers.  BPH Continue Flomax .  Peripheral neuropathy Continue Lyrica .  DVT prophylaxis: Eliquis  Code Status: Full Code (discussed with the patient) Family Communication: Wife at bedside. Level of care: Progressive Care Unit Admission status: It is my clinical opinion that admission to INPATIENT is reasonable and necessary because of the expectation that this patient will require hospital care that crosses at least 2 midnights to treat this condition based on the medical complexity of the problems presented.  Given the aforementioned information, the predictability of an adverse outcome is felt to be significant.  Editha Ram MD [facility]  If 7PM-7AM, please contact night-coverage [email or web address]  02/21/2024, 8:00 PM

## 2024-02-22 ENCOUNTER — Inpatient Hospital Stay (HOSPITAL_COMMUNITY)

## 2024-02-22 DIAGNOSIS — Z7952 Long term (current) use of systemic steroids: Secondary | ICD-10-CM

## 2024-02-22 DIAGNOSIS — B9562 Methicillin resistant Staphylococcus aureus infection as the cause of diseases classified elsewhere: Secondary | ICD-10-CM | POA: Diagnosis present

## 2024-02-22 DIAGNOSIS — R7881 Bacteremia: Secondary | ICD-10-CM

## 2024-02-22 DIAGNOSIS — Z96698 Presence of other orthopedic joint implants: Secondary | ICD-10-CM | POA: Diagnosis not present

## 2024-02-22 DIAGNOSIS — I4891 Unspecified atrial fibrillation: Secondary | ICD-10-CM

## 2024-02-22 DIAGNOSIS — Z7901 Long term (current) use of anticoagulants: Secondary | ICD-10-CM

## 2024-02-22 LAB — BLOOD CULTURE ID PANEL (REFLEXED) - BCID2

## 2024-02-22 LAB — CBC
HCT: 29 % — ABNORMAL LOW (ref 39.0–52.0)
Hemoglobin: 9.2 g/dL — ABNORMAL LOW (ref 13.0–17.0)
MCH: 28.5 pg (ref 26.0–34.0)
MCHC: 31.7 g/dL (ref 30.0–36.0)
MCV: 89.8 fL (ref 80.0–100.0)
Platelets: 128 K/uL — ABNORMAL LOW (ref 150–400)
RBC: 3.23 MIL/uL — ABNORMAL LOW (ref 4.22–5.81)
RDW: 17.3 % — ABNORMAL HIGH (ref 11.5–15.5)
WBC: 10.9 K/uL — ABNORMAL HIGH (ref 4.0–10.5)
nRBC: 0 % (ref 0.0–0.2)

## 2024-02-22 LAB — BASIC METABOLIC PANEL WITH GFR
Anion gap: 6 (ref 5–15)
BUN: 18 mg/dL (ref 8–23)
CO2: 24 mmol/L (ref 22–32)
Calcium: 7.9 mg/dL — ABNORMAL LOW (ref 8.9–10.3)
Chloride: 107 mmol/L (ref 98–111)
Creatinine, Ser: 0.89 mg/dL (ref 0.61–1.24)
GFR, Estimated: >60 mL/min (ref >60)
Glucose, Bld: 86 mg/dL (ref 70–99)
Potassium: 3.3 mmol/L — ABNORMAL LOW (ref 3.5–5.1)
Sodium: 137 mmol/L (ref 135–145)

## 2024-02-22 LAB — MAGNESIUM: Magnesium: 2.1 mg/dL (ref 1.7–2.4)

## 2024-02-22 MED ORDER — OXYCODONE HCL 5 MG PO TABS
5.0000 mg | ORAL_TABLET | ORAL | Status: DC | PRN
  Administered 2024-02-22 – 2024-02-26 (×4): 5 mg via ORAL
  Filled 2024-02-22 (×4): qty 1

## 2024-02-22 MED ORDER — POTASSIUM CHLORIDE CRYS ER 20 MEQ PO TBCR
40.0000 meq | EXTENDED_RELEASE_TABLET | Freq: Once | ORAL | Status: AC
  Administered 2024-02-22: 40 meq via ORAL
  Filled 2024-02-22: qty 2

## 2024-02-22 MED ORDER — DAPTOMYCIN-SODIUM CHLORIDE 700-0.9 MG/100ML-% IV SOLN
8.0000 mg/kg | Freq: Every day | INTRAVENOUS | Status: DC
Start: 2024-02-22 — End: 2024-02-27
  Administered 2024-02-22 – 2024-02-27 (×6): 700 mg via INTRAVENOUS
  Filled 2024-02-22 (×6): qty 100

## 2024-02-22 MED ORDER — CHLORHEXIDINE GLUCONATE CLOTH 2 % EX PADS
6.0000 | MEDICATED_PAD | Freq: Every day | CUTANEOUS | Status: DC
Start: 2024-02-22 — End: 2024-02-27
  Administered 2024-02-22 – 2024-02-27 (×6): 6 via TOPICAL

## 2024-02-22 MED ORDER — DILTIAZEM HCL ER COATED BEADS 180 MG PO CP24
180.0000 mg | ORAL_CAPSULE | Freq: Every day | ORAL | Status: DC
  Administered 2024-02-22 – 2024-02-27 (×6): 180 mg via ORAL
  Filled 2024-02-22 (×6): qty 1

## 2024-02-22 NOTE — Consult Note (Signed)
 [facility]    Date of Admission:  02/21/2024     Reason for Consult: mrsa bsi    Referring Provider: albertina; Anmar Al Sultani     Lines:  peripheral  Abx: Vanc Cefepime  flagyl     Other: Pred 20 mg daily   Assessment: 78 yo old male afib on eliquis , HFrEF, copd, hx bka, stage 1-2 sacral decub, neuropathy, extensive hardware presence (knees, hips, left ankle, left shoulder, entire back), admitted for a week of progressive weakness and a couple days fever, chill, found to have mrsa bsi   Of note, he appears to be on chronic prednisone  for ?copd 20 mg daily  Has had chronic intermittent dry cough since beginning of August unchanged was treated for hcap sometimes in August that didn't affect the cough. Not hypoxic  Cxr probably left lower lobe attelectasis  Do not feel cap present   With mrsa will need to see if pulm emboli an issue but no particular need to get chest ct now. Tte and tee will be needed to r/o endocarditis   Hardwares area no new pain but over the next few days would benefit from repeat questioning to see if further investigation needed  At least 4 weeks abx will be needed if tte/tee negative and no obvious active bone/joint infection  9/27 bcx 2 of 2 set with mrsa    With pred use will get chest ct to see if something else amock like pjp, or emboli related to mrsa bsi    Plan: Please investigate who manages patient's prednisone . And if can't go below 15 mg a day, then long term pjp prophylaxis needed Chest ct Repeat bcx tomorrow Tte and will need tee Up to 20 % of hardware could get complicated and will need judicious investigation (mri/tissue culture) if symptomatic more than normal. He has too many hardwares (hips, knees, left ankle, left shoulder, entire spine); his right shoulder has rotator cuff tendinopathy/djd and will need to tease out if involved in infection too Continue abx with just daptomycin  for now; I have no suspicion for pneumonia and will stop CAP abx Maintain contact isolation precaution For right shoulder future replacement, I recommend not doing for at least 6 months beyond treatment of current mrsa bsi Discussed with primary team     ------------------------------------------------ Principal Problem:   Left lower lobe pneumonia Active Problems:   Paroxysmal atrial fibrillation (HCC)   Hyperlipidemia   GERD (gastroesophageal reflux disease) status post Nissen fundoplication   COPD (chronic obstructive pulmonary disease) (HCC)   BPH (benign prostatic hyperplasia)   Sepsis (HCC)   AKI (acute kidney injury)   Hypokalemia    HPI: Levi [name] is a 78 y.o. male afib on eliquis , HFrEF, copd, hx bka, stage 1-2 sacral decub, neuropathy, extensive hardware presence (knees, hips, left ankle, left shoulder, entire back), admitted for a week of progressive weakness and a couple days fever, chill, found to have mrsa bsi   Of note, he appears to be on chronic prednisone  for ?copd 20 mg daily  Has had chronic intermittent dry cough since beginning of August unchanged was treated for hcap sometimes in August that didn't affect the cough. Not hypoxic  Didn't really notice baseline change otherwise till a week ago when progressive malaise noted and the last few days f/c, decreased appetite  Chronic right shoulder pain unchanged  Chronic right bka stump pain unchanged  On presentation afebrile Wbc 11 Cxr  lll atelectasis vs infiltrate Bcx mrsa Placed on vanc/cefepime /flagyl  for pna   No other complaint. Pending outpatient ortho eval for right shoulder replacement    Family History  Problem Relation Age of Onset   Diabetes Mother    Heart disease Father        Died 35, [place]   Cancer Father    Hyperlipidemia Father    Stroke Father    Prostate cancer Brother    Colon polyps Brother    Diabetes Brother    Colon polyps Brother    Asthma Brother    COPD Brother     Colon cancer Brother        Dx age 76   Cancer Brother    Colon cancer Other    Esophageal cancer Neg Hx    Rectal cancer Neg Hx    Stomach cancer Neg Hx    Pancreatic cancer Neg Hx    Kidney disease Neg Hx    Liver disease Neg Hx    Neuropathy Neg Hx     Social History   Tobacco Use   Smoking status: Never   Smokeless tobacco: Never  Vaping Use   Vaping status: Never Used  Substance Use Topics   Alcohol use: No   Drug use: No    Allergies  Allergen Reactions   Levaquin  [Levofloxacin ] Itching    Tolerated ciprofloxacin  in 2022.    Review of Systems: ROS All Other ROS was negative, except mentioned above   Past Medical History:  Diagnosis Date   Agent orange exposure 1970's   takes Imdur ,Diltiazem , and Atacand  daily   Agent orange exposure    ALLERGIC RHINITIS    Allergy     seasonal allergies   Anemia    iron  deficiency   Arthritis    Asthma    uses inhaler and nebulizers   Atrial fibrillation (HCC)    Bruises easily    d/t prednisone  daily   Cataract    Cellulitis 03/22/2020   Chest wall pain 09/21/2019   L scapula, reported 4/27 21/     CHF (congestive heart failure) (HCC)    dx-on meds to treat   Chronic bronchitis (HCC)    used to get it q yr; last time ?2008 (04/28/2012)   Clotting disorder 2019   On blood thinner when I bleed it is for a consideral amount of time. This is tegardless of size of wound.   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 2010   History of total right hip arthroplasty 07/31/2015   Hyperlipidemia    takes Lipitor daily; pt. states he takes a preventive   Hypothyroidism    Joint pain    Joint swelling  Leukocytosis    Lumbar stenosis status post T12-S1 fusion 04/07/2017   MSSA bacteremia 03/19/2023   Nausea and vomiting 11/12/2022   Neuromuscular disorder (HCC)    bilat legs, and bilat arms   Neuropathy    Osteoarthritis of left knee 01/10/2012   Osteoporosis 2012   PAF (paroxysmal atrial fibrillation) (HCC)    a. Dx 04/2012, CHA2DS2VASc = 1 (age);  b. 04/2012 Echo: EF 40-50%, mild MR.   Personal history of colonic adenomas 02/09/2013   Pneumonia    Primary osteoarthritis of right knee 06/17/2014   Sepsis (HCC) 03/18/2023   Shortness of breath dyspnea    Thyroid  disease    no taking meds-caused dizziness       Scheduled Meds:  apixaban   5 mg Oral BID   arformoterol   15 mcg Nebulization BID   atorvastatin   20 mg Oral Daily   ferrous gluconate   324 mg Oral Daily   pantoprazole   40 mg Oral BID AC   predniSONE   20 mg Oral Q breakfast   pregabalin   150 mg Oral Daily   pregabalin   75 mg Oral QPM   tamsulosin   0.4 mg Oral QHS   Continuous Infusions:  ceFEPime  (MAXIPIME ) IV Stopped (02/22/24 1102)   vancomycin      PRN Meds:.acetaminophen  **OR** acetaminophen , guaiFENesin -dextromethorphan, ipratropium-albuterol    OBJECTIVE: Blood pressure 130/71, pulse 95, temperature 98.8 F (37.1 C), temperature source Oral, resp. rate (!) 23, height 6' 2 (1.88 m), weight 86.2 kg, SpO2 93%.  Physical Exam  General/constitutional: alert/oriented, conversant, cooperative, good insight HEENT: Normocephalic, PER, Conj Clear, EOMI, Oropharynx clear Neck supple CV: rrr no mrg Lungs: clear to auscultation, normal respiratory effort Abd: Soft, Nontender Ext: no  edema Skin: echymosis purpura on ext; a blister medial to right bka stump; intertrigo candida changes bilateral groin Neuro: nonfocal -- generalized weakness MSK: relatively good rom shoulders/knees/hips; mildly tender right bka stump; no back tenderness; no swelling/heat in any peripheral joint.    Lab Results Lab Results  Component Value Date   WBC 10.9 (H) 02/22/2024   HGB 9.2 (L) 02/22/2024   HCT 29.0 (L) 02/22/2024   MCV 89.8 02/22/2024   PLT 128 (L) 02/22/2024    Lab Results  Component Value Date   CREATININE 0.89 02/22/2024   BUN 18 02/22/2024   NA 137 02/22/2024   K 3.3 (L) 02/22/2024   CL 107 02/22/2024   CO2 24 02/22/2024    Lab Results  Component Value Date   ALT 20 02/21/2024   AST 24 02/21/2024   ALKPHOS 74 02/21/2024   BILITOT 1.1 02/21/2024      Microbiology: Recent Results (from the past 240 hours)  Resp panel by RT-PCR (RSV, Flu A&B, Covid) Anterior Nasal Swab     Status: None   Collection Time: 02/21/24  5:05 PM   Specimen: Anterior Nasal Swab  Result Value Ref Range Status   SARS Coronavirus 2 by RT PCR NEGATIVE NEGATIVE Final   Influenza A by PCR NEGATIVE NEGATIVE Final   Influenza B by PCR NEGATIVE NEGATIVE Final    Comment: (NOTE) The Xpert Xpress SARS-CoV-2/FLU/RSV plus assay is intended as an aid in the diagnosis of influenza from Nasopharyngeal swab specimens and should not be used as a sole basis for treatment. Nasal washings and aspirates are unacceptable for Xpert Xpress SARS-CoV-2/FLU/RSV testing.  Fact Sheet for Patients: BloggerCourse.com  Fact Sheet for Healthcare Providers: SeriousBroker.it  This test is not yet approved or cleared by the [place]  FDA and has been authorized  for detection and/or diagnosis of SARS-CoV-2 by FDA under an Emergency Use Authorization (EUA). This EUA will remain in effect (meaning this test can be used) for the duration of the COVID-19  declaration under Section 564(b)(1) of the Act, 21 U.S.C. section 360bbb-3(b)(1), unless the authorization is terminated or revoked.     Resp Syncytial Virus by PCR NEGATIVE NEGATIVE Final    Comment: (NOTE) Fact Sheet for Patients: BloggerCourse.com  Fact Sheet for Healthcare Providers: SeriousBroker.it  This test is not yet approved or cleared by the [place]  FDA and has been authorized for detection and/or diagnosis of SARS-CoV-2 by FDA under an Emergency Use Authorization (EUA). This EUA will remain in effect (meaning this test can be used) for the duration of the COVID-19 declaration under Section 564(b)(1) of the Act, 21 U.S.C. section 360bbb-3(b)(1), unless the authorization is terminated or revoked.  Performed at [facility], [address] [place], [place] [address]   Blood Culture (routine x 2)     Status: None (Preliminary result)   Collection Time: 02/21/24  5:17 PM   Specimen: BLOOD  Result Value Ref Range Status   Specimen Description BLOOD LEFT ANTECUBITAL  Final   Special Requests   Final    BOTTLES DRAWN AEROBIC AND ANAEROBIC Blood Culture adequate volume   Culture  Setup Time   Final    GRAM POSITIVE COCCI ANAEROBIC BOTTLE ONLY CRITICAL RESULT CALLED TO, READ BACK BY AND VERIFIED WITH: [facility] FELICIANO CLOSE [id number] AT 1054 BY EC Performed at [facility], [address] [place], [place] [address]    Culture GRAM POSITIVE COCCI  Final   Report Status PENDING  Incomplete  Blood Culture ID Panel (Reflexed)     Status: Abnormal   Collection Time: 02/21/24  5:17 PM  Result Value Ref Range Status   Enterococcus faecalis NOT DETECTED NOT DETECTED Final   Enterococcus Faecium NOT DETECTED NOT DETECTED Final   Listeria monocytogenes NOT DETECTED NOT DETECTED Final   Staphylococcus species DETECTED (A) NOT DETECTED Final    Comment: CRITICAL RESULT CALLED TO, READ BACK BY AND VERIFIED  WITH: PHARMD FELICIANO CLOSE [id number] AT 1054 BY EC    Staphylococcus aureus (BCID) DETECTED (A) NOT DETECTED Final    Comment: Methicillin (oxacillin)-resistant Staphylococcus aureus (MRSA). MRSA is predictably resistant to beta-lactam antibiotics (except ceftaroline). Preferred therapy is vancomycin  unless clinically contraindicated. Patient requires contact precautions if  hospitalized. CRITICAL RESULT CALLED TO, READ BACK BY AND VERIFIED WITH: PHARMD FELICIANO CLOSE [id number] AT 1054 BY EC    Staphylococcus epidermidis NOT DETECTED NOT DETECTED Final   Staphylococcus lugdunensis NOT DETECTED NOT DETECTED Final   Streptococcus species NOT DETECTED NOT DETECTED Final   Streptococcus agalactiae NOT DETECTED NOT DETECTED Final   Streptococcus pneumoniae NOT DETECTED NOT DETECTED Final   Streptococcus pyogenes NOT DETECTED NOT DETECTED Final   A.calcoaceticus-baumannii NOT DETECTED NOT DETECTED Final   Bacteroides fragilis NOT DETECTED NOT DETECTED Final   Enterobacterales NOT DETECTED NOT DETECTED Final   Enterobacter cloacae complex NOT DETECTED NOT DETECTED Final   Escherichia coli NOT DETECTED NOT DETECTED Final   Klebsiella aerogenes NOT DETECTED NOT DETECTED Final   Klebsiella oxytoca NOT DETECTED NOT DETECTED Final   Klebsiella pneumoniae NOT DETECTED NOT DETECTED Final   Proteus species NOT DETECTED NOT DETECTED Final   Salmonella species NOT DETECTED NOT DETECTED Final   Serratia marcescens NOT DETECTED NOT DETECTED Final   Haemophilus influenzae NOT DETECTED NOT DETECTED Final   Neisseria meningitidis  NOT DETECTED NOT DETECTED Final   Pseudomonas aeruginosa NOT DETECTED NOT DETECTED Final   Stenotrophomonas maltophilia NOT DETECTED NOT DETECTED Final   Candida albicans NOT DETECTED NOT DETECTED Final   Candida auris NOT DETECTED NOT DETECTED Final   Candida glabrata NOT DETECTED NOT DETECTED Final   Candida krusei NOT DETECTED NOT DETECTED Final   Candida parapsilosis NOT  DETECTED NOT DETECTED Final   Candida tropicalis NOT DETECTED NOT DETECTED Final   Cryptococcus neoformans/gattii NOT DETECTED NOT DETECTED Final   Meth resistant mecA/C and MREJ DETECTED (A) NOT DETECTED Final    Comment: CRITICAL RESULT CALLED TO, READ BACK BY AND VERIFIED WITH: [facility] FELICIANO CLOSE [id number] AT 1054 BY EC Performed at [facility], [address] [place], [place] [address]   Blood Culture (routine x 2)     Status: None (Preliminary result)   Collection Time: 02/21/24  5:31 PM   Specimen: BLOOD  Result Value Ref Range Status   Specimen Description BLOOD RIGHT ANTECUBITAL  Final   Special Requests   Final    BOTTLES DRAWN AEROBIC AND ANAEROBIC Blood Culture adequate volume   Culture  Setup Time   Final    GRAM POSITIVE COCCI ANAEROBIC BOTTLE ONLY CRITICAL VALUE NOTED.  VALUE IS CONSISTENT WITH PREVIOUSLY REPORTED AND CALLED VALUE. Performed at [facility], [address], [place], [place] [address]    Culture [facility] POSITIVE COCCI  Final   Report Status PENDING  Incomplete     Serology:    Imaging: If present, new imagings (plain films, ct scans, and mri) have been personally visualized and interpreted; radiology reports have been reviewed. Decision making incorporated into the Impression / Recommendations.  9/27 cxr FINDINGS: Single frontal view of the chest demonstrates a stable cardiac silhouette. Increased density in the retrocardiac region may reflect airspace disease or atelectasis. No large effusion or pneumothorax. No acute bony abnormalities.   9/27 left foot xray No acute fracture or radiographic evidence of osteomyelitis   9/27 right knee xray No acute finding    Constance ONEIDA Passer, MD [facility] [phone] pager    02/22/2024, 3:04 PM

## 2024-02-22 NOTE — Progress Notes (Signed)
 Pharmacy Antibiotic Note  Levi [name] is a 78 y.o. male admitted on 02/21/2024 with pneumonia>bacteremia.  Pharmacy has been consulted for vancomycin  dosing.  Presenting with progressive weakness and fatigue w/ pain at R leg stump. Received antibiotics in ED including vancomycin  1750 mg IV x1 and cefepime  2g IV. WBC down to 10.9, afebrile. Scr 0.89 (CrCl 82 mL/min). Bcx 3/4 bottles growing GPC with BCID showing MRSA - ID consulted and change to daptomycin. Of note, vancomycin  originally ordered for pneumonia but ID referencing that PNA not likely present.   Plan: Order daptomycin 700 mg (~8 mg/kg) every 24 hours - will order for 9/28 given last vanc dose on 9/27@1920  Monitor renal fx, cx results, clinical pic, CK weekly (ordered for 9/29), and ID recommendations   Height: 6' 2 (188 cm) Weight: 86.2 kg (190 lb) IBW/kg (Calculated) : 82.2  Temp (24hrs), Avg:98.2 F (36.8 C), Min:97.6 F (36.4 C), Max:98.8 F (37.1 C)  Recent Labs  Lab 02/21/24 1705 02/21/24 1720 02/21/24 2027 02/22/24 0436  WBC 13.7*  --   --  10.9*  CREATININE 1.12  --   --  0.89  LATICACIDVEN  --  2.7* 1.1  --     Estimated Creatinine Clearance: 82.1 mL/min (by C-G formula based on SCr of 0.89 mg/dL).    Allergies  Allergen Reactions   Levaquin  [Levofloxacin ] Itching    Tolerated ciprofloxacin  in 2022.    Antimicrobials this admission: Azithro + metronidazole  9/27 x1 Cefepime  9/27 >> 9/28 Vancomycin  9/27 x1 Daptomycin 9/28 >>  Dose adjustments this admission: N/A  Microbiology results: 9/27 BCx: sent 9/27 COVID/Flu PCR: neg   Thank you for allowing pharmacy to participate in this patient's care,  Suzen Sour, PharmD, BCCCP Clinical Pharmacist  Phone: [phone] 02/22/2024 3:36 PM  Please check AMION for all [facility] Pharmacy phone numbers After 10:00 PM, call Main Pharmacy [phone]

## 2024-02-22 NOTE — Progress Notes (Signed)
 PHARMACY - PHYSICIAN COMMUNICATION CRITICAL VALUE ALERT - BLOOD CULTURE IDENTIFICATION (BCID)  Levi [name] is an 78 y.o. male who presented to [facility] on 02/21/2024 with a chief complaint of worsening weakness x1 week, subjective fevers, and productive cough x1 month.   Assessment:  1/4 GPC, BCID = MRSA - Patient with a history of Pseudomonas and given outpatient antibiotics (Augmentin  for URI symptoms), cefepime  appropriate for empiric coverage for now Name of physician (or Provider) Contacted: Dr. Duffy Al-Sultani  Current antibiotics: vancomycin , cefepime  (for pneumonia)  Changes to prescribed antibiotics recommended:  - ID consult and continue current therapy for now pending ID workup  Results for orders placed or performed during the hospital encounter of 02/21/24  Blood Culture ID Panel (Reflexed) (Collected: 02/21/2024  5:17 PM)  Result Value Ref Range   Enterococcus faecalis NOT DETECTED NOT DETECTED   Enterococcus Faecium NOT DETECTED NOT DETECTED   Listeria monocytogenes NOT DETECTED NOT DETECTED   Staphylococcus species DETECTED (A) NOT DETECTED   Staphylococcus aureus (BCID) DETECTED (A) NOT DETECTED   Staphylococcus epidermidis NOT DETECTED NOT DETECTED   Staphylococcus lugdunensis NOT DETECTED NOT DETECTED   Streptococcus species NOT DETECTED NOT DETECTED   Streptococcus agalactiae NOT DETECTED NOT DETECTED   Streptococcus pneumoniae NOT DETECTED NOT DETECTED   Streptococcus pyogenes NOT DETECTED NOT DETECTED   A.calcoaceticus-baumannii NOT DETECTED NOT DETECTED   Bacteroides fragilis NOT DETECTED NOT DETECTED   Enterobacterales NOT DETECTED NOT DETECTED   Enterobacter cloacae complex NOT DETECTED NOT DETECTED   Escherichia coli NOT DETECTED NOT DETECTED   Klebsiella aerogenes NOT DETECTED NOT DETECTED   Klebsiella oxytoca NOT DETECTED NOT DETECTED   Klebsiella pneumoniae NOT DETECTED NOT DETECTED   Proteus species NOT DETECTED NOT DETECTED   Salmonella species  NOT DETECTED NOT DETECTED   Serratia marcescens NOT DETECTED NOT DETECTED   Haemophilus influenzae NOT DETECTED NOT DETECTED   Neisseria meningitidis NOT DETECTED NOT DETECTED   Pseudomonas aeruginosa NOT DETECTED NOT DETECTED   Stenotrophomonas maltophilia NOT DETECTED NOT DETECTED   Candida albicans NOT DETECTED NOT DETECTED   Candida auris NOT DETECTED NOT DETECTED   Candida glabrata NOT DETECTED NOT DETECTED   Candida krusei NOT DETECTED NOT DETECTED   Candida parapsilosis NOT DETECTED NOT DETECTED   Candida tropicalis NOT DETECTED NOT DETECTED   Cryptococcus neoformans/gattii NOT DETECTED NOT DETECTED   Meth resistant mecA/C and MREJ DETECTED (A) NOT DETECTED    Feliciano Close, PharmD PGY2 Infectious Diseases Pharmacy Resident  02/22/2024 11:02 AM

## 2024-02-22 NOTE — Hospital Course (Addendum)
 Levi [name] is a 78 y.o. male with medical history significant of hypertension, hyperlipidemia, GERD, paroxysmal A-fib on Eliquis , superficial vein thrombosis, iron  deficiency anemia, HFmrEF, depression, COPD, BPH, right foot osteomyelitis status post right BKA, stage I sacral pressure ulcer, peripheral neuropathy.  Last hospital admission 8/1-8//2025 for mild cellulitis of BKA stump and HCAP.  Patient presents to the ED today via EMS with complaints of generalized weakness, fever, and cough.  Temperature 100.4 F oral and he was given Tylenol  by EMS.  Also noted to have soft blood pressures in the range of 90s over 60s with EMS.  Patient endorsed some mild pain in the posterior aspect of his right BKA stump.  His stage I sacral wound did not appear infected.  Patient noted to have a noninfected skin tear to his left thigh, nonhealing wound to the medial aspect of his right stump, and mild swelling of his left great toe.  He was recently treated with antibiotics for URI symptoms.  Labs notable for WBC count 13.7, hemoglobin 9.2 (previously ranging between 10.8-12.2 on labs last month), MCV 89.5, potassium 3.0, magnesium  1.6, creatinine 1.1 (baseline 0.6-0.8), calcium  8.2, albumin 2.1, normal LFTs, COVID/influenza/RSV PCR negative, UA pending, blood cultures in process, lactic acid 2.7.  Chest x-ray showing left lower lobe consolidation concerning for pneumonia.  X-ray of left foot negative for acute fracture or evidence of osteomyelitis.  X-ray of right knee negative for signs of osteomyelitis at amputation stump site.  Patient was given oral potassium, azithromycin , cefepime , metronidazole , vancomycin , 2 L LR, and IV mag in the ED.  TRH called to admit.    Assessment and plan  Sepsis secondary MRSA bacteremia Presenting with cough, fever, leukocytosis, lactic acidosis, and soft blood pressures. Chest x-ray showing left lower lobe consolidation concerning for pneumonia.  COVID/influenza/RSV PCR negative.   -  Lactate resolved  - Afebrile.  Leukocytosis improving to 10.9 from 13.7 on admission - BCx (9/27) positive for MRSA, final cultures pending - ID consulted - indicated low suspicion for CAP, recommended abx revision from Vanc and cefepime  to daptomycin, TTE followed by TEE, and CT chest - Started Daptomycin. Vanc and cefepime  discontinued - Repeat BCx ordered for 9/29 - TTE ordered   Mild AKI - resolved - Likely prerenal in etiology in the setting of sepsis.  - Creatinine down to 0.9 from 1.1 on admission (baseline 0.6-0.8).     Hypokalemia - K 3.3 - Repleted with KCl 40 mEq  Hypomagnesemia - resolved   Generalized weakness - PT/OT eval - Fall precautions   Chronic HFmrEF - TEE done in October 2024 showing EF 40 to 45%, mild mitral regurgitation, and trivial to mild aortic regurgitation.   - Clinically euvolemic   Hypertension - BP improved after fluid resuscitation - SBP ranging from 120s to 130s - Continue to hold home antihypertensives at this time  Paroxysmal A-fib - Currently in sinus rhythm - Continue Eliquis  - PTA Cardizem  resumed given improvement in BP after IVFs   Chronic iron  deficiency anemia - Hemoglobin 9.2, stable (previously ranging between 10.8-12.2 on labs last month).   - Patient is on chronic anticoagulation but not endorsing any symptoms of GI bleed or any other bleeding.   - Continue home iron  supplement.   Stage I sacral pressure wound - Wound care.   Hyperlipidemia - Continue Lipitor.   GERD - Continue Protonix .   COPD - Stable, no signs of acute exacerbation.   - He is on chronic steroids, reportedly Prednisone  20 mg  daily. Continued while hospitalized. May require PJP prophylaxis.  - Continue home inhalers.   BPH - Continue Flomax .   Peripheral neuropathy - Continue Lyrica .

## 2024-02-22 NOTE — Evaluation (Signed)
 Physical Therapy Evaluation Patient Details Name: Levi [name] MRN: [id number] DOB: [birth year] Today's Date: 02/22/2024  History of Present Illness  78 y.o. male presents to [facility] hospital on 02/21/2024 with weakness, fever, cough. Imaging concerning for LLL PNA. PMH includes HTN, HLD, GERD, PAF, HFmrEF, depression, COPD, BPH, R BKA, peripheral neuropathy.  Clinical Impression  Pt presents to PT with deficits in strength, power, functional mobility, balance, endurance. Pt reports a gradual decline in strength over the last 2 weeks resulting in an inability to perform car transfers as well as physical assistance for transfers between other surfaces from his spouse. Pt is at a high risk for falls when transferring currently due to poor sitting balance, impaired strength and endurance. Pt made significant progress in [facility] after R BKA and he demonstrates the motivation and potential to make similar significant functional gains at this time. Patient will benefit from intensive inpatient follow-up therapy, >3 hours/day.        If plan is discharge home, recommend the following: A lot of help with walking and/or transfers;A lot of help with bathing/dressing/bathroom;Assistance with cooking/housework;Assist for transportation;Help with stairs or ramp for entrance   Can travel by private vehicle        Equipment Recommendations Hoyer lift  Recommendations for Other Services  Rehab consult    Functional Status Assessment Patient has had a recent decline in their functional status and demonstrates the ability to make significant improvements in function in a reasonable and predictable amount of time.     Precautions / Restrictions Precautions Precautions: Fall Recall of Precautions/Restrictions: Intact Restrictions Weight Bearing Restrictions Per Provider Order: No Other Position/Activity Restrictions: chronic R BKA      Mobility  Bed Mobility Overal bed mobility: Needs Assistance Bed  Mobility: Supine to Sit, Sit to Supine     Supine to sit: Min assist, HOB elevated Sit to supine: Mod assist        Transfers Overall transfer level: Needs assistance Equipment used: None Transfers: Bed to chair/wheelchair/BSC            Lateral/Scoot Transfers: Mod assist (pt laterally scoots for 3 trials of 2-3 to right side and then 2 trials of 4-6 to left side with PT assistance, does not complete a functional transfer due to pending arrival of transport for CT)      Ambulation/Gait                  Stairs            Wheelchair Mobility     Tilt Bed    Modified Rankin (Stroke Patients Only)       Balance Overall balance assessment: Needs assistance Sitting-balance support: No upper extremity supported, Feet supported Sitting balance-[name] Scale: Poor Sitting balance - Comments: right lateral lean Postural control: Right lateral lean                                   Pertinent Vitals/Pain Pain Assessment Pain Assessment: Faces Faces Pain Scale: Hurts even more Pain Location: L buttock, possible cyst? Pain Descriptors / Indicators: Sore Pain Intervention(s): Monitored during session    Home Living Family/patient expects to be discharged to:: Private residence Living Arrangements: Spouse/significant other Available Help at Discharge: Family Type of Home: House Home Access: Ramped entrance       Home Layout: Able to live on main level with bedroom/bathroom Home Equipment: Control and instrumentation engineer (2 wheels);Cane -  single point;Rollator (4 wheels);Wheelchair - manual;Toilet riser      Prior Function Prior Level of Function : Needs assist             Mobility Comments: pt was able to perform slide board transfers without physical assistance and utilize a MWC to mobilize until the last 2 weeks when his strength began to decline ADLs Comments: wife assisting with LB dressing, indep with showering (although  not entering shower since BKA). Wife cooks and cleans, also has a cleaner come in 2x/month     Extremity/Trunk Assessment   Upper Extremity Assessment Upper Extremity Assessment: Generalized weakness    Lower Extremity Assessment Lower Extremity Assessment: Generalized weakness;RLE deficits/detail RLE Deficits / Details: chronic R BKA, ROM at knee and hip WFL    Cervical / Trunk Assessment Cervical / Trunk Assessment: Kyphotic  Communication   Communication Communication: No apparent difficulties    Cognition Arousal: Alert Behavior During Therapy: WFL for tasks assessed/performed   PT - Cognitive impairments: No apparent impairments                         Following commands: Intact       Cueing Cueing Techniques: Verbal cues     General Comments General comments (skin integrity, edema, etc.): VSS on RA    Exercises     Assessment/Plan    PT Assessment Patient needs continued PT services  PT Problem List Decreased strength;Decreased activity tolerance;Decreased balance;Decreased mobility;Decreased knowledge of use of DME;Decreased safety awareness;Decreased knowledge of precautions       PT Treatment Interventions DME instruction;Gait training;Functional mobility training;Therapeutic activities;Therapeutic exercise;Balance training;Neuromuscular re-education;Cognitive remediation;Patient/family education;Wheelchair mobility training    PT Goals (Current goals can be found in the Care Plan section)  Acute Rehab PT Goals Patient Stated Goal: to return to transferring without physical assistance PT Goal Formulation: With patient/family Time For Goal Achievement: 03/07/24 Potential to Achieve Goals: Fair Additional Goals Additional Goal #1: Pt will mobilize in a manual wheelchair for >150' to demonstrate improved independence in household mobility    Frequency Min 2X/week     Co-evaluation               AM-PAC PT 6 Clicks Mobility   Outcome Measure Help needed turning from your back to your side while in a flat bed without using bedrails?: A Little Help needed moving from lying on your back to sitting on the side of a flat bed without using bedrails?: A Lot Help needed moving to and from a bed to a chair (including a wheelchair)?: A Lot Help needed standing up from a chair using your arms (e.g., wheelchair or bedside chair)?: A Lot Help needed to walk in hospital room?: Total Help needed climbing 3-5 steps with a railing? : Total 6 Click Score: 11    End of Session   Activity Tolerance: Patient tolerated treatment well Patient left: in bed;with call bell/phone within reach;with bed alarm set;with family/visitor present Nurse Communication: Need for lift equipment;Mobility status PT Visit Diagnosis: Other abnormalities of gait and mobility (R26.89);Muscle weakness (generalized) (M62.81);History of falling (Z91.81)    Time: 8372-8346 PT Time Calculation (min) (ACUTE ONLY): 26 min   Charges:   PT Evaluation $PT Eval Low Complexity: 1 Low   PT General Charges $$ ACUTE PT VISIT: 1 Visit         Bernardino JINNY Ruth, PT, DPT Acute Rehabilitation Office [phone]   Bernardino JINNY Ruth 02/22/2024, 5:36 PM

## 2024-02-22 NOTE — Progress Notes (Addendum)
 Progress Note   Patient: Levi [name] FMW:[id number] DOB: [birth year] DOA: 02/21/2024     1 DOS: the patient was seen and examined on 02/22/2024   Brief hospital course: Levi [name] is a 78 y.o. male with medical history significant of hypertension, hyperlipidemia, GERD, paroxysmal A-fib on Eliquis , superficial vein thrombosis, iron  deficiency anemia, HFmrEF, depression, COPD, BPH, right foot osteomyelitis status post right BKA, stage I sacral pressure ulcer, peripheral neuropathy.  Last hospital admission 8/1-8//2025 for mild cellulitis of BKA stump and HCAP.  Patient presents to the ED today via EMS with complaints of generalized weakness, fever, and cough.  Temperature 100.4 F oral and he was given Tylenol  by EMS.  Also noted to have soft blood pressures in the range of 90s over 60s with EMS.  Patient endorsed some mild pain in the posterior aspect of his right BKA stump.  His stage I sacral wound did not appear infected.  Patient noted to have a noninfected skin tear to his left thigh, nonhealing wound to the medial aspect of his right stump, and mild swelling of his left great toe.  He was recently treated with antibiotics for URI symptoms.  Labs notable for WBC count 13.7, hemoglobin 9.2 (previously ranging between 10.8-12.2 on labs last month), MCV 89.5, potassium 3.0, magnesium  1.6, creatinine 1.1 (baseline 0.6-0.8), calcium  8.2, albumin 2.1, normal LFTs, COVID/influenza/RSV PCR negative, UA pending, blood cultures in process, lactic acid 2.7.  Chest x-ray showing left lower lobe consolidation concerning for pneumonia.  X-ray of left foot negative for acute fracture or evidence of osteomyelitis.  X-ray of right knee negative for signs of osteomyelitis at amputation stump site.  Patient was given oral potassium, azithromycin , cefepime , metronidazole , vancomycin , 2 L LR, and IV mag in the ED.  TRH called to admit.    Assessment and plan  Sepsis secondary MRSA bacteremia Presenting with cough,  fever, leukocytosis, lactic acidosis, and soft blood pressures. Chest x-ray showing left lower lobe consolidation concerning for pneumonia.  COVID/influenza/RSV PCR negative.   - Lactate resolved  - Afebrile.  Leukocytosis improving to 10.9 from 13.7 on admission - BCx (9/27) positive for MRSA, final cultures pending - ID consulted - indicated low suspicion for CAP, recommended abx revision from Vanc and cefepime  to daptomycin, TTE followed by TEE, and CT chest - Started Daptomycin. Vanc and cefepime  discontinued - Repeat BCx ordered for 9/29 - TTE ordered   Mild AKI - resolved - Likely prerenal in etiology in the setting of sepsis.  - Creatinine down to 0.9 from 1.1 on admission (baseline 0.6-0.8).     Hypokalemia - K 3.3 - Repleted with KCl 40 mEq  Hypomagnesemia - resolved   Generalized weakness - PT/OT eval - Fall precautions   Chronic HFmrEF - TEE done in October 2024 showing EF 40 to 45%, mild mitral regurgitation, and trivial to mild aortic regurgitation.   - Clinically euvolemic   Hypertension - BP improved after fluid resuscitation - SBP ranging from 120s to 130s - Continue to hold home antihypertensives at this time  Paroxysmal A-fib - Currently in sinus rhythm - Continue Eliquis  - PTA Cardizem  resumed given improvement in BP after IVFs   Chronic iron  deficiency anemia - Hemoglobin 9.2, stable (previously ranging between 10.8-12.2 on labs last month).   - Patient is on chronic anticoagulation but not endorsing any symptoms of GI bleed or any other bleeding.   - Continue home iron  supplement.   Stage I sacral pressure wound - Wound care.  Hyperlipidemia - Continue Lipitor.   GERD - Continue Protonix .   COPD - Stable, no signs of acute exacerbation.   - He is on chronic steroids, reportedly Prednisone  20 mg daily. Continued while hospitalized. May require PJP prophylaxis.  - Continue home inhalers.   BPH - Continue Flomax .   Peripheral neuropathy -  Continue Lyrica .  Assessment and Plan: No notes have been filed under this hospital service. Service: Hospitalist       Subjective: Patient seen this morning. Patient's wife, Levi [name], was at bedside and provided the majority of the history. Reported patient has been experiencing worsening weakness over the last week with subjective fevers and a Tmax of 101.9 degrees at night yesterday.  At time of evaluation, the patient was complaining of mild shoulder pain and fatigue but otherwise denied chest pain, shortness of breath, abdominal pain, nausea, vomiting, diarrhea.  Physical Exam: Vitals:   02/22/24 0300 02/22/24 0400 02/22/24 0442 02/22/24 0600  BP: 136/62 127/60  130/73  Pulse: 76 (!) 38 76 78  Resp: 14 15 17 18   Temp:   98.2 F (36.8 C)   TempSrc:   Oral   SpO2: 96%   97%  Weight:      Height:       Physical Exam Constitutional:      General: He is not in acute distress.    Appearance: He is not ill-appearing.  HENT:     Mouth/Throat:     Mouth: Mucous membranes are moist.  Eyes:     Pupils: Pupils are equal, round, and reactive to light.  Cardiovascular:     Rate and Rhythm: Normal rate and regular rhythm.     Heart sounds: Normal heart sounds. No murmur heard. Pulmonary:     Effort: Pulmonary effort is normal. No respiratory distress.     Breath sounds: Normal breath sounds. No wheezing.  Abdominal:     General: Bowel sounds are normal. There is no distension.     Palpations: Abdomen is soft.     Tenderness: There is no abdominal tenderness. There is no guarding.  Musculoskeletal:     Right lower leg: No edema.     Left lower leg: No edema.     Comments: Right BKA stump clean without erythema, warmth, tenderness, or discharge.  Right-sided gluteal sacral ulcer stage 1 with some induration and tenderness but without skin breakdown or fluctuance.     Right Lower Extremity: Right leg is amputated below knee.  Skin:    General: Skin is warm and dry.     Capillary  Refill: Capillary refill takes less than 2 seconds.     Comments: Bilateral extremity senile purpura  Neurological:     Mental Status: He is alert and oriented to person, place, and time. Mental status is at baseline.    Family Communication: Patient's wife, Levi [name], was at bedside  Disposition: Status is: Inpatient Remains inpatient appropriate because: MRSA bacteremia requiring IV antibiotics   Planned Discharge Destination: Pending PT and OT eval    Time spent: 52 minutes  Author: Sharni Negron Al-Sultani, MD 02/22/2024 8:19 AM  For on call review [email or web address].

## 2024-02-23 ENCOUNTER — Inpatient Hospital Stay (HOSPITAL_COMMUNITY)

## 2024-02-23 DIAGNOSIS — R7881 Bacteremia: Secondary | ICD-10-CM

## 2024-02-23 LAB — BASIC METABOLIC PANEL WITH GFR
Anion gap: 10 (ref 5–15)
BUN: 17 mg/dL (ref 8–23)
CO2: 20 mmol/L — ABNORMAL LOW (ref 22–32)
Calcium: 8 mg/dL — ABNORMAL LOW (ref 8.9–10.3)
Chloride: 106 mmol/L (ref 98–111)
Creatinine, Ser: 0.7 mg/dL (ref 0.61–1.24)
GFR, Estimated: >60 mL/min (ref >60)
Glucose, Bld: 79 mg/dL (ref 70–99)
Potassium: 3.2 mmol/L — ABNORMAL LOW (ref 3.5–5.1)
Sodium: 136 mmol/L (ref 135–145)

## 2024-02-23 LAB — ECHOCARDIOGRAM COMPLETE
AR max vel: 1.71 cm2
AV Area VTI: 1.63 cm2
AV Area mean vel: 1.73 cm2
AV Mean grad: 8 mmHg
AV Peak grad: 15.1 mmHg
Ao pk vel: 1.94 m/s
Area-P 1/2: 3.77 cm2
Height: 74 in
MV VTI: 2.86 cm2
S' Lateral: 4.6 cm
Weight: 3040 [oz_av]

## 2024-02-23 LAB — CBC
HCT: 30.6 % — ABNORMAL LOW (ref 39.0–52.0)
Hemoglobin: 9.5 g/dL — ABNORMAL LOW (ref 13.0–17.0)
MCH: 28.1 pg (ref 26.0–34.0)
MCHC: 31 g/dL (ref 30.0–36.0)
MCV: 90.5 fL (ref 80.0–100.0)
Platelets: 135 K/uL — ABNORMAL LOW (ref 150–400)
RBC: 3.38 MIL/uL — ABNORMAL LOW (ref 4.22–5.81)
RDW: 17.2 % — ABNORMAL HIGH (ref 11.5–15.5)
WBC: 9.1 K/uL (ref 4.0–10.5)
nRBC: 0 % (ref 0.0–0.2)

## 2024-02-23 LAB — CK: Total CK: 119 U/L (ref 49–397)

## 2024-02-23 LAB — MAGNESIUM: Magnesium: 2 mg/dL (ref 1.7–2.4)

## 2024-02-23 MED ORDER — POTASSIUM CHLORIDE CRYS ER 20 MEQ PO TBCR
40.0000 meq | EXTENDED_RELEASE_TABLET | ORAL | Status: AC
  Administered 2024-02-23 (×2): 40 meq via ORAL
  Filled 2024-02-23 (×2): qty 2

## 2024-02-23 MED ORDER — IOHEXOL 350 MG/ML SOLN
75.0000 mL | Freq: Once | INTRAVENOUS | Status: AC | PRN
  Administered 2024-02-23: 75 mL via INTRAVENOUS

## 2024-02-23 NOTE — Progress Notes (Signed)
 Progress Note   Patient: Levi [name] FMW:[id number] DOB: Apr 20, 1946 DOA: 02/21/2024     2 DOS: the patient was seen and examined on 02/23/2024   Brief hospital course: Levi [name] is a 78 y.o. male with medical history significant of hypertension, hyperlipidemia, GERD, paroxysmal A-fib on Eliquis , superficial vein thrombosis, iron  deficiency anemia, HFmrEF, depression, COPD, BPH, right foot osteomyelitis status post right BKA, stage I sacral pressure ulcer, peripheral neuropathy.  Last hospital admission 8/1-8//2025 for mild cellulitis of BKA stump and HCAP.  Patient presents to the ED today via EMS with complaints of generalized weakness, fever, and cough.  Temperature 100.4 F oral and he was given Tylenol  by EMS.  Also noted to have soft blood pressures in the range of 90s over 60s with EMS.  Patient endorsed some mild pain in the posterior aspect of his right BKA stump.  His stage I sacral wound did not appear infected.  Patient noted to have a noninfected skin tear to his left thigh, nonhealing wound to the medial aspect of his right stump, and mild swelling of his left great toe.  He was recently treated with antibiotics for URI symptoms.  Labs notable for WBC count 13.7, hemoglobin 9.2 (previously ranging between 10.8-12.2 on labs last month), MCV 89.5, potassium 3.0, magnesium  1.6, creatinine 1.1 (baseline 0.6-0.8), calcium  8.2, albumin 2.1, normal LFTs, COVID/influenza/RSV PCR negative, UA pending, blood cultures in process, lactic acid 2.7.  Chest x-ray showing left lower lobe consolidation concerning for pneumonia.  X-ray of left foot negative for acute fracture or evidence of osteomyelitis.  X-ray of right knee negative for signs of osteomyelitis at amputation stump site.  Patient was given oral potassium, azithromycin , cefepime , metronidazole , vancomycin , 2 L LR, and IV mag in the ED.  TRH called to admit.    Assessment and plan  # Sepsis secondary to MRSA bacteremia Presenting with cough,  fever, leukocytosis, lactic acidosis, and soft blood pressures. Chest x-ray showing left lower lobe consolidation concerning for pneumonia.  COVID/influenza/RSV PCR negative.   - Lactate resolved  - Afebrile.  Leukocytosis improving to 9.1 from 13.7 on admission - BCx (9/27) positive for MRSA, final cultures pending - CT chest (9/29) showed  - ID following - indicated low suspicion for CAP, recommended abx revision from Vanc and cefepime  to daptomycin, TTE followed by TEE, and CT chest - BCx (9/29) - pending - TTE pending - CT pelvis w/ contrast pending to evaluate for possible gluteal abscess - Continue Daptomycin   # Mild AKI - resolved - Likely prerenal in etiology in the setting of sepsis.  - Creatinine down to 0.7 from 1.1 on admission (baseline 0.6-0.8).     # Hypokalemia - K 3.2 today  - Repleted with KCl 40 mEq x 2  # COPD - Stable, no signs of acute exacerbation.   - He is on chronic steroids, reportedly Prednisone  20 mg daily for many years.  The pulmonologist who initially prescribed this has retired. Patient has not yet establish care with a new pulmonologist.  - Patient was previously on PJP prophylaxis with Bactrim  from 03/2023 to 06/2023.  Patient's wife cannot recall why it was stopped. - Continue prednisone  20 mg daily - Continue home inhalers.  # Hypertension - BP improved after fluid resuscitation - SBP ranging from 120s to 130s - Continue to hold home antihypertensives at this time  # Right rotator cuff injury  Right shoulder pain - PRN analgesics with Tylenol  and oxycodone   # Hypomagnesemia - resolved   #  Generalized weakness Patient has had a decline in strength and mobility, and requires more assistance that his wife is able to provide him at home on a regular basis. - PT/OT eval for rehab needs - recommending acute inpatient rehab - Was screened for CIR but was found not to meet medical necessity criteria -- other rehab options recommended - Fall  precautions   # Chronic HFmrEF - TEE done in October 2024 showing EF 40 to 45%, mild mitral regurgitation, and trivial to mild aortic regurgitation.   - Clinically euvolemic   # Paroxysmal A-fib - Currently in sinus rhythm - Continue Eliquis  - Continue home Cardizem    # Chronic iron  deficiency anemia - Hemoglobin 9.5, stable (previously ranging between 10.8-12.2 on labs last month).   - Patient is on chronic anticoagulation but not endorsing any symptoms of GI bleed or any other bleeding.   - Continue home iron  supplement.   # Stage I sacral pressure wound - Wound care.   # Hyperlipidemia - Continue Lipitor.   # GERD - Continue Protonix .   # BPH - Continue Flomax    # Peripheral neuropathy - Continue Lyrica   Assessment and Plan: No notes have been filed under this hospital service. Service: Hospitalist       Subjective: Patient seen this morning.  No acute events overnight.  Patient was updated on his care plan including TEE and planned repeat of blood cultures.  Inquired about the patient's use of prednisone . Patient reported he has been on chronic steroids for many years and his original prescribing pulmonologist has retired.  He has not establish care with a new pulmonologist yet.  Otherwise denies any concerns at this time.  Physical Exam: Vitals:   02/23/24 0717 02/23/24 0843 02/23/24 1141 02/23/24 1426  BP: 139/69  128/76 112/67  Pulse:   82 79  Resp: 17  16   Temp: 98 F (36.7 C)  98.5 F (36.9 C)   TempSrc: Oral  Oral   SpO2:  100% 98% 98%  Weight:      Height:       Physical Exam Constitutional:      General: He is not in acute distress.    Appearance: He is not ill-appearing.  HENT:     Mouth/Throat:     Mouth: Mucous membranes are moist.  Eyes:     Pupils: Pupils are equal, round, and reactive to light.  Cardiovascular:     Rate and Rhythm: Normal rate and regular rhythm.     Heart sounds: Normal heart sounds. No murmur heard. Pulmonary:      Effort: Pulmonary effort is normal. No respiratory distress.     Breath sounds: Normal breath sounds. No wheezing.  Abdominal:     General: Bowel sounds are normal. There is no distension.     Palpations: Abdomen is soft.     Tenderness: There is no abdominal tenderness. There is no guarding.  Musculoskeletal:     Right lower leg: No edema.     Left lower leg: No edema.     Comments: Right BKA stump clean without erythema, warmth, tenderness, or discharge.  Right-sided gluteal sacral ulcer stage 1 with some induration and tenderness but without skin breakdown or fluctuance.     Right Lower Extremity: Right leg is amputated below knee.  Skin:    General: Skin is warm and dry.     Capillary Refill: Capillary refill takes less than 2 seconds.     Comments: Bilateral extremity senile purpura  Neurological:     Mental Status: He is alert and oriented to person, place, and time. Mental status is at baseline.    Family Communication: Patient's wife, [name], was updated  Disposition: Status is: Inpatient Remains inpatient appropriate because: MRSA bacteremia requiring IV antibiotics   Planned Discharge Destination: Pending PT and OT eval    Time spent: 52 minutes  Author: Faheem Ziemann Al-Sultani, MD 02/23/2024 3:28 PM  For on call review [email or web address].

## 2024-02-23 NOTE — Progress Notes (Signed)
 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 Work up this admission has shown that the patient has MRSA bacteremia.   MRSA Bacteremia Extensive Hardware to his knees, hips, left ankle, left shoulder and back  Pain and Induration to his Left Buttock COPD and he is on Prednisone  20mg  PO daily   - The patient initially received Vancomycin , Cefepime  and Metronidazole  due to concern for a possible pneumonia. Pneumonia seems unlikely so all antibiotics were stopped  - Will start Daptomycin today for his MRSA bacteremia  - Will order an echocardiogram to rule out endocarditis - Repeat blood cultures to check for clearance - Closely monitor for worsening joint pain given his extensive hardware  - Maintain contact isolation precaution - For right shoulder future replacement, I recommend not doing for at least 6 months beyond treatment of current mrsa bsi  -Further recommendations to follow pending additional work up   Patient Active Problem List   Diagnosis Date Noted   MRSA bacteremia 02/22/2024   Left lower lobe pneumonia 02/21/2024   AKI (acute kidney injury) 02/21/2024   Hypokalemia 02/21/2024   Sacral wound, initial encounter 02/20/2024   Sepsis (HCC) 03/18/2023   Primary osteoarthritis, right shoulder 07/05/2022   BPH (benign prostatic hyperplasia) 02/21/2022   Acquired absence of right leg below knee  (HCC) 01/22/2022   Peripheral neuropathy 02/22/2021   Chronic systolic heart failure (HCC) 02/22/2021   COPD (chronic obstructive pulmonary disease) (HCC) 03/21/2020   GERD (gastroesophageal reflux disease) status post Nissen fundoplication 12/03/2017   Hyperlipidemia    Paroxysmal atrial fibrillation (HCC) 05/11/2012   Asthma, moderate persistent 03/06/2009   Current Discharge Medication List     CONTINUE these medications which have NOT CHANGED   Details  albuterol  (VENTOLIN  HFA) 108 (90 Base) MCG/ACT inhaler INHALE 2 PUFFS BY MOUTH EVERY 4 HOURS AS NEEDED FOR WHEEZE OR FOR SHORTNESS OF BREATH Qty: 8 each, Refills: 4    apixaban  (ELIQUIS ) 5 MG TABS tablet Take 1 tablet (5 mg total) by mouth 2 (two) times daily. Qty: 180 tablet, Refills: 1   Associated Diagnoses: Paroxysmal atrial fibrillation (HCC)    atorvastatin  (LIPITOR) 20 MG tablet Take 1 tablet (20 mg total) by mouth daily. Qty: 90 tablet, Refills: 3    cyclobenzaprine  (FLEXERIL ) 10 MG tablet TAKE 1 TABLET BY MOUTH THREE TIMES A DAY AS NEEDED FOR MUSCLE SPASMS Qty: 90 tablet, Refills: 3    diltiazem  (CARDIZEM  CD) 180 MG 24 hr capsule Take 1 capsule (180 mg total) by mouth daily. Qty: 90 capsule, Refills: 3   Comments: Please d/c any RX on file for Diltiazem  - med change    diltiazem  (CARDIZEM ) 30 MG tablet Take 30 mg by mouth daily as needed (for high HR).    ferrous gluconate  (FERGON) 324 MG  tablet Take 1 tablet (324 mg total) by mouth daily. Qty: 30 tablet, Refills: 0    ipratropium-albuterol  (DUONEB) 0.5-2.5 (3) MG/3ML SOLN 1 NEBULE EVERY 6 HOURS AS NEEDED. **J45.3** Qty: 270 mL, Refills: 4    isosorbide  mononitrate (IMDUR ) 30 MG 24 hr tablet Take 1 tablet (30 mg total) by mouth daily. Qty: 90 tablet, Refills: 3    Olodaterol HCl (STRIVERDI RESPIMAT) 2.5 MCG/ACT AERS INHALE 2 PUFFS BY MOUTH INTO THE LUNGS DAILY Qty: 4 g, Refills: 11    pantoprazole  (PROTONIX ) 40 MG tablet Take 1 tablet (40 mg total) by mouth 2  (two) times daily before a meal. Qty: 60 tablet, Refills: 0   Associated Diagnoses: Gastroesophageal reflux disease without esophagitis    predniSONE  (DELTASONE ) 5 MG tablet 3 tabs daily Qty: 100 tablet, Refills: 5    pregabalin  (LYRICA ) 150 MG capsule Take 1 capsule (150 mg total) by mouth 2 (two) times daily. Qty: 60 capsule, Refills: 2   Associated Diagnoses: Neuropathy    tamsulosin  (FLOMAX ) 0.4 MG CAPS capsule Take 1 capsule (0.4 mg total) by mouth at bedtime. Qty: 30 capsule, Refills: 0       Subjective: The patient is lying in bed in no apparent distress. He denies any complaints at this time however, his wife at bedside states that he has been complaining of pain to his left buttock. Patient is also complaining of right shoulder pain, which is chronic and in need of replacement. Fever of 101.9 noted the other night    Review of Systems: Constitutional: Negative for fever, chills, fatigue or unexpected weight change. HENT: Negative for rhinorrhea and sore throat. Eyes: Negative for visual disturbance. Respiratory: Negative for cough and shortness of breath. Cardiovascular: Negative for chest pain and palpitations. Gastrointestinal: Negative for abdominal pain, nausea, vomiting and diarrhea. Genitourinary: Negative for dysuria and hematuria. Left buttock pain  Musculoskeletal: Negative for arthralgias and joint swelling. Skin: Negative for rash.   Past Medical History:  Diagnosis Date   Agent orange exposure 1970's   takes Imdur ,Diltiazem , and Atacand  daily   Agent orange exposure    ALLERGIC RHINITIS    Allergy     seasonal allergies   Anemia    iron  deficiency   Arthritis    Asthma    uses inhaler and nebulizers   Atrial fibrillation (HCC)    Bruises easily    d/t prednisone  daily   Cataract    Cellulitis 03/22/2020   Chest wall pain 09/21/2019   L scapula, reported 4/27 21/     CHF (congestive heart failure) (HCC)    dx-on meds to treat   Chronic  bronchitis (HCC)    used to get it q yr; last time ?2008 (04/28/2012)   Clotting disorder 2019   On blood thinner when I bleed it is for a consideral amount of time. This is tegardless of size of wound.   COPD (chronic obstructive pulmonary disease) (HCC)    agent orange exposure   Degenerative disk disease    everywhere (04/28/2012)   Depression due to physical illness 12/15/2023   Diverticulitis    Dysrhythmia    afib   Elevated uric acid in blood    takes Allopurinol  daily   Emphysema of lung (HCC)    Enlarged prostate    but not on any meds   Essential (primary) hypertension 11/15/2020   GERD (gastroesophageal reflux disease)    takes Protonix  daily   H/O hiatal hernia    Headache    History of  colonic polyps 02/09/2013   01/2013 - 3 adenomas max 8 mm  01/2015 2 diminutive adenomas - repeat colon 2021     History of colonoscopy    History of pneumonia    a. 2010   History of total right hip arthroplasty 07/31/2015   Hyperlipidemia    takes Lipitor daily; pt. states he takes a preventive   Hypothyroidism    Joint pain    Joint swelling    Leukocytosis    Lumbar stenosis status post T12-S1 fusion 04/07/2017   MSSA bacteremia 03/19/2023   Nausea and vomiting 11/12/2022   Neuromuscular disorder (HCC)    bilat legs, and bilat arms   Neuropathy    Osteoarthritis of left knee 01/10/2012   Osteoporosis 2012   PAF (paroxysmal atrial fibrillation) (HCC)    a. Dx 04/2012, CHA2DS2VASc = 1 (age);  b. 04/2012 Echo: EF 40-50%, mild MR.   Personal history of colonic adenomas 02/09/2013   Pneumonia    Primary osteoarthritis of right knee 06/17/2014   Sepsis (HCC) 03/18/2023   Shortness of breath dyspnea    Thyroid  disease    no taking meds-caused dizziness   Social History   Tobacco Use   Smoking status: Never   Smokeless tobacco: Never  Vaping Use   Vaping status: Never Used  Substance Use Topics   Alcohol use: No   Drug use: No   Family History  Problem Relation  Age of Onset   Diabetes Mother    Heart disease Father        Died 80, MI   Cancer Father    Hyperlipidemia Father    Stroke Father    Prostate cancer Brother    Colon polyps Brother    Diabetes Brother    Colon polyps Brother    Asthma Brother    COPD Brother    Colon cancer Brother        Dx age 32   Cancer Brother    Colon cancer Other    Esophageal cancer Neg Hx    Rectal cancer Neg Hx    Stomach cancer Neg Hx    Pancreatic cancer Neg Hx    Kidney disease Neg Hx    Liver disease Neg Hx    Neuropathy Neg Hx    Allergies  Allergen Reactions   Levaquin  [Levofloxacin ] Itching    Tolerated ciprofloxacin  in 2022.   Objective: Vitals:   02/23/24 0843 02/23/24 1141 02/23/24 1426 02/23/24 1538  BP:  128/76 112/67 116/69  Pulse:  82 79   Resp:  16    Temp:  98.5 F (36.9 C)  98.6 F (37 C)  TempSrc:  Oral  Oral  SpO2: 100% 98% 98% 98%  Weight:      Height:       Body mass index is 24.39 kg/m.  General: Well developed, well nourished male in no apparent distress HENT: Moist mucous membranes, normal nose, normal external ears, and normocephalic Neck: Supple, trachea midline, and normal cervical range of motion Eyes: PERRL, EOMI, non-icteric, and normal conjunctivae and lids Lungs: Clear to auscultation bilaterally. No wheezing, rales or rhonchi Cardiac: Regular rate and rhythm. No murmurs, rubs or gallops. No peripheral edema Abdomen: Soft, Non distended, Non tender, active bowel sounds Skin: Intact, no focal erythema or rash, and warm and dry GU: Left buttock with induration noted  Musculoskeletal: No obvious skeletal abnormalities. Good range of motion with all extremities without associated pain Neuro: Alert, no focal neurologic deficits, moves all extremities  Psych: Oriented x 3, cooperative   Problem List Items Addressed This Visit       Respiratory   Left lower lobe pneumonia - Primary   Relevant Medications   guaiFENesin -dextromethorphan (ROBITUSSIN  DM) 100-10 MG/5ML syrup 5 mL   ipratropium-albuterol  (DUONEB) 0.5-2.5 (3) MG/3ML nebulizer solution 3 mL   arformoterol  (BROVANA ) nebulizer solution 15 mcg   DAPTOmycin (CUBICIN) IVPB 700 mg/100mL premix     Other   Hypokalemia   Other Visit Diagnoses       Generalized weakness         Hypomagnesemia          Evalene Munch, MD [facility] 02/23/2024, 4:08 PM

## 2024-02-23 NOTE — Progress Notes (Shared)
 Inpatient Rehab Admissions Coordinator:   Per therapy recommendations, patient was screened for CIR candidacy by Leita Kleine, MS, CCC-SLP. At this time, Pt. does not appear to demonstrate medical necessity to justify in hospital rehabilitation/CIR. will not pursue a rehab consult for this Pt.   Recommend other rehab venues to be pursued.  Please contact me with any questions.  Leita Kleine, MS, CCC-SLP Rehab Admissions Coordinator  [phone] (celll) [phone] (office)

## 2024-02-23 NOTE — Plan of Care (Signed)
?  Problem: Clinical Measurements: ?Goal: Respiratory complications will improve ?Outcome: Progressing ?Goal: Cardiovascular complication will be avoided ?Outcome: Progressing ?  ?Problem: Activity: ?Goal: Risk for activity intolerance will decrease ?Outcome: Progressing ?  ?Problem: Nutrition: ?Goal: Adequate nutrition will be maintained ?Outcome: Progressing ?  ?Problem: Coping: ?Goal: Level of anxiety will decrease ?Outcome: Progressing ?  ?Problem: Safety: ?Goal: Ability to remain free from injury will improve ?Outcome: Progressing ?  ?

## 2024-02-23 NOTE — Evaluation (Signed)
 Occupational Therapy Evaluation Patient Details Name: Levi [name] MRN: [id number] DOB: 28-Feb-1946 Today's Date: 02/23/2024   History of Present Illness   78 y.o. male presents to [facility] hospital on 02/21/2024 with weakness, fever, cough. Imaging concerning for LLL PNA. PMH includes HTN, HLD, GERD, PAF, HFmrEF, depression, COPD, BPH, R BKA, peripheral neuropathy.     Clinical Impressions Pt has assist at baseline from spouse for ADLs, and uses slide board (and at times wife's assist) to transfer to w/c. Pt uses w/c for mobility. Pt currently needing up to max A for ADLs, and mod a for bed mobility. Pt with R lateral lean sitting EOB, unable to sit unsupported from BUEs due to sacral wound pain. Pt with hx of RUE RTC injury, limited functional use of R shoulder, only able to minimally perform AROM. Pt presenting with impairments listed below, will follow acutely. Patient will benefit from intensive inpatient follow-up therapy, >3 hours/day to maximize safety/ind with ADL/functional mobility.      If plan is discharge home, recommend the following:   Two people to help with walking and/or transfers;A lot of help with bathing/dressing/bathroom;Assistance with cooking/housework;Assist for transportation;Help with stairs or ramp for entrance     Functional Status Assessment   Patient has had a recent decline in their functional status and demonstrates the ability to make significant improvements in function in a reasonable and predictable amount of time.     Equipment Recommendations   Other (comment) (defer)     Recommendations for Other Services   PT consult     Precautions/Restrictions   Precautions Precautions: Fall Recall of Precautions/Restrictions: Intact Precaution/Restrictions Comments: R BKA     Mobility Bed Mobility Overal bed mobility: Needs Assistance Bed Mobility: Supine to Sit, Sit to Supine     Supine to sit: Mod assist Sit to supine: Mod assist    General bed mobility comments: mod A, to sit, pt unable to sit unsupported by BUE due to sacral wound pain    Transfers                   General transfer comment: deferred, will need +2      Balance Overall balance assessment: Needs assistance Sitting-balance support: No upper extremity supported, Feet supported Sitting balance-[name] Scale: Poor Sitting balance - Comments: right lateral lean                                   ADL either performed or assessed with clinical judgement   ADL Overall ADL's : Needs assistance/impaired Eating/Feeding: Set up;Sitting   Grooming: Set up;Sitting   Upper Body Bathing: Minimal assistance;Sitting   Lower Body Bathing: Sitting/lateral leans;Moderate assistance   Upper Body Dressing : Moderate assistance;Sitting   Lower Body Dressing: Moderate assistance;Sitting/lateral leans   Toilet Transfer: Maximal assistance   Toileting- Clothing Manipulation and Hygiene: Total assistance       Functional mobility during ADLs: Total assistance       Vision   Vision Assessment?: No apparent visual deficits     Perception Perception: Not tested       Praxis Praxis: Not tested       Pertinent Vitals/Pain Pain Assessment Pain Assessment: Faces Pain Score: 4  Faces Pain Scale: Hurts little more Pain Location: R shoulder Pain Descriptors / Indicators: Discomfort Pain Intervention(s): Limited activity within patient's tolerance, Monitored during session, Repositioned     Extremity/Trunk Assessment Upper Extremity Assessment Upper  Extremity Assessment: Generalized weakness;RUE deficits/detail RUE Deficits / Details: hx R RTC tear ~15* shoulder flexion RUE Coordination: decreased gross motor   Lower Extremity Assessment Lower Extremity Assessment: Defer to PT evaluation   Cervical / Trunk Assessment Cervical / Trunk Assessment: Kyphotic   Communication Communication Communication: No apparent  difficulties   Cognition Arousal: Alert Behavior During Therapy: WFL for tasks assessed/performed                                 Following commands: Intact       Cueing  General Comments   Cueing Techniques: Verbal cues  VSS on RA   Exercises     Shoulder Instructions      Home Living Family/patient expects to be discharged to:: Private residence Living Arrangements: Spouse/significant other Available Help at Discharge: Family;Available 24 hours/day Type of Home: House Home Access: Ramped entrance     Home Layout: Able to live on main level with bedroom/bathroom     Bathroom Shower/Tub: Arts development officer Toilet: Handicapped height Bathroom Accessibility: Yes   Home Equipment: Control and instrumentation engineer (2 wheels);Cane - single point;Rollator (4 wheels);Wheelchair - manual;Toilet riser   Additional Comments: pt reports he was supposed to get a prosthetic for RLE next week      Prior Functioning/Environment Prior Level of Function : Needs assist             Mobility Comments: uses slide board to transfer to w/c, ADLs Comments: wife assists PRN for ADLs    OT Problem List: Decreased strength;Decreased range of motion;Decreased activity tolerance;Impaired balance (sitting and/or standing);Decreased coordination;Decreased safety awareness;Impaired UE functional use   OT Treatment/Interventions: Self-care/ADL training;Therapeutic exercise;Energy conservation;DME and/or AE instruction;Therapeutic activities;Patient/family education;Balance training      OT Goals(Current goals can be found in the care plan section)   Acute Rehab OT Goals Patient Stated Goal: did not state OT Goal Formulation: With patient Time For Goal Achievement: 03/08/24 Potential to Achieve Goals: Good ADL Goals Pt Will Perform Upper Body Dressing: with supervision;sitting Pt Will Perform Lower Body Dressing: with min assist;sitting/lateral leans;sit  to/from stand Pt Will Transfer to Toilet: with mod assist;with transfer board;bedside commode Pt/caregiver will Perform Home Exercise Program: Increased ROM;Increased strength;Right Upper extremity;With minimal assist Additional ADL Goal #1: pt will perform bed mobility CGA in prep for ADLs   OT Frequency:  Min 2X/week    Co-evaluation              AM-PAC OT 6 Clicks Daily Activity     Outcome Measure Help from another person eating meals?: A Little Help from another person taking care of personal grooming?: A Little Help from another person toileting, which includes using toliet, bedpan, or urinal?: A Lot Help from another person bathing (including washing, rinsing, drying)?: A Lot Help from another person to put on and taking off regular upper body clothing?: A Lot Help from another person to put on and taking off regular lower body clothing?: A Lot 6 Click Score: 14   End of Session Nurse Communication: Mobility status  Activity Tolerance: Patient tolerated treatment well Patient left: in bed;with call bell/phone within reach;with bed alarm set  OT Visit Diagnosis: Unsteadiness on feet (R26.81);Other abnormalities of gait and mobility (R26.89);Muscle weakness (generalized) (M62.81)                Time: 8983-8964 OT Time Calculation (min): 19 min Charges:  OT General Charges $OT  Visit: 1 Visit OT Evaluation $OT Eval Moderate Complexity: 1 Mod  Damon Baisch K, OTD, OTR/L SecureChat Preferred Acute Rehab [phone]   Laneta POUR Koonce 02/23/2024, 10:52 AM

## 2024-02-23 NOTE — Progress Notes (Signed)
  Echocardiogram 2D Echocardiogram has been performed.  Koleen KANDICE Popper, RDCS 02/23/2024, 1:27 PM

## 2024-02-24 ENCOUNTER — Ambulatory Visit (HOSPITAL_COMMUNITY): Admission: RE | Admit: 2024-02-24 | Source: Ambulatory Visit

## 2024-02-24 DIAGNOSIS — J181 Lobar pneumonia, unspecified organism: Secondary | ICD-10-CM

## 2024-02-24 DIAGNOSIS — R234 Changes in skin texture: Secondary | ICD-10-CM

## 2024-02-24 DIAGNOSIS — J449 Chronic obstructive pulmonary disease, unspecified: Secondary | ICD-10-CM

## 2024-02-24 DIAGNOSIS — L539 Erythematous condition, unspecified: Secondary | ICD-10-CM

## 2024-02-24 DIAGNOSIS — R609 Edema, unspecified: Secondary | ICD-10-CM

## 2024-02-24 DIAGNOSIS — B9562 Methicillin resistant Staphylococcus aureus infection as the cause of diseases classified elsewhere: Secondary | ICD-10-CM | POA: Diagnosis not present

## 2024-02-24 DIAGNOSIS — R7881 Bacteremia: Secondary | ICD-10-CM | POA: Diagnosis not present

## 2024-02-24 LAB — BASIC METABOLIC PANEL WITH GFR
Anion gap: 12 (ref 5–15)
BUN: 15 mg/dL (ref 8–23)
CO2: 21 mmol/L — ABNORMAL LOW (ref 22–32)
Calcium: 8.1 mg/dL — ABNORMAL LOW (ref 8.9–10.3)
Chloride: 106 mmol/L (ref 98–111)
Creatinine, Ser: 0.66 mg/dL (ref 0.61–1.24)
GFR, Estimated: >60 mL/min (ref >60)
Glucose, Bld: 100 mg/dL — ABNORMAL HIGH (ref 70–99)
Potassium: 3.7 mmol/L (ref 3.5–5.1)
Sodium: 139 mmol/L (ref 135–145)

## 2024-02-24 LAB — CBC
HCT: 30.4 % — ABNORMAL LOW (ref 39.0–52.0)
Hemoglobin: 9.6 g/dL — ABNORMAL LOW (ref 13.0–17.0)
MCH: 28.3 pg (ref 26.0–34.0)
MCHC: 31.6 g/dL (ref 30.0–36.0)
MCV: 89.7 fL (ref 80.0–100.0)
Platelets: 175 K/uL (ref 150–400)
RBC: 3.39 MIL/uL — ABNORMAL LOW (ref 4.22–5.81)
RDW: 17.1 % — ABNORMAL HIGH (ref 11.5–15.5)
WBC: 9.6 K/uL (ref 4.0–10.5)
nRBC: 0 % (ref 0.0–0.2)

## 2024-02-24 LAB — CULTURE, BLOOD (ROUTINE X 2): Special Requests: ADEQUATE

## 2024-02-24 MED ORDER — AMOXICILLIN-POT CLAVULANATE 875-125 MG PO TABS
1.0000 | ORAL_TABLET | Freq: Two times a day (BID) | ORAL | Status: DC
  Administered 2024-02-24 – 2024-02-27 (×7): 1 via ORAL
  Filled 2024-02-24 (×7): qty 1

## 2024-02-24 MED ORDER — LORAZEPAM 2 MG/ML IJ SOLN: 0.5000 mg | Freq: Four times a day (QID) | INTRAMUSCULAR | Status: DC | PRN

## 2024-02-24 MED ORDER — LORAZEPAM 2 MG/ML IJ SOLN: 1.0000 mg | Freq: Four times a day (QID) | INTRAMUSCULAR | Status: DC | PRN

## 2024-02-24 MED ORDER — MELATONIN 5 MG PO TABS
5.0000 mg | ORAL_TABLET | Freq: Every day | ORAL | Status: DC
  Administered 2024-02-24 – 2024-02-26 (×3): 5 mg via ORAL
  Filled 2024-02-24 (×3): qty 1

## 2024-02-24 NOTE — Progress Notes (Signed)
 Physical Therapy Treatment Patient Details Name: Levi [name] MRN: [id number] DOB: 27-Oct-1945 Today's Date: 02/24/2024   History of Present Illness 78 y.o. male presents to [facility] on 02/21/2024 with weakness, fever, cough. Imaging concerning for LLL PNA, pt admitted for sepsis due to MRSA. PMH includes HTN, HLD, GERD, PAF, HFmrEF, depression, COPD, BPH, R BKA, peripheral neuropathy.    PT Comments  The pt was eager to progress mobility and participate. He continues to present with limited strength and power in LLE, core strength, balance, and pain in RUE due to suspected rotator cuff injury that further limits his ability to use UE. The pt was able to greatly improve activity tolerance for scooting, sit-stand transfers, and standing tolerance this session, but continues to need modA of 2 generally to power up and maintain stability. Recommendation for intensive therapies after d/c remains appropriate as pt is highly motivated, has improved well in the past, and has great family support.     If plan is discharge home, recommend the following: A lot of help with walking and/or transfers;A lot of help with bathing/dressing/bathroom;Assistance with cooking/housework;Assist for transportation;Help with stairs or ramp for entrance   Can travel by private vehicle        Equipment Recommendations  Hoyer lift    Recommendations for Other Services Rehab consult     Precautions / Restrictions Precautions Precautions: Fall Recall of Precautions/Restrictions: Intact Precaution/Restrictions Comments: R BKA Restrictions Weight Bearing Restrictions Per Provider Order: No Other Position/Activity Restrictions: chronic R BKA, does not have prosthetic yet but was supposed to pick up from [employer] this week     Mobility  Bed Mobility Overal bed mobility: Needs Assistance Bed Mobility: Supine to Sit, Sit to Supine     Supine to sit: HOB elevated, Used rails, Mod assist Sit to supine: Mod assist    General bed mobility comments: minA to manage LE, then modA to elevate trunk and use bed pad to scoot pt hips to EOB    Transfers Overall transfer level: Needs assistance Equipment used: Rolling walker (2 wheels) Transfers: Bed to chair/wheelchair/BSC, Sit to/from Stand Sit to Stand: Mod assist, +2 physical assistance, From elevated surface          Lateral/Scoot Transfers: Contact guard assist General transfer comment: pt completed hip lift from EOB with BUE support and LLE x 5 at start of session with good clearance, then completed x6 sit-stand from EOB with modA of 2 and elevated bed. BUE support on RW and cues to maintain RW on ground, extend at hips, and maintain upright trunk, up to 15 sec standing tolerance    Ambulation/Gait               General Gait Details: pt unable to advance LLE   Stairs             Wheelchair Mobility     Tilt Bed    Modified Rankin (Stroke Patients Only)       Balance Overall balance assessment: Needs assistance Sitting-balance support: No upper extremity supported, Feet supported Sitting balance-[name] Scale: Poor Sitting balance - Comments: right lateral lean Postural control: Right lateral lean, Posterior lean Standing balance support: Bilateral upper extremity supported, During functional activity Standing balance-[name] Scale: Poor Standing balance comment: posterior lean despite modA and BUE support                            Communication Communication Communication: No apparent difficulties  Cognition  Arousal: Alert Behavior During Therapy: WFL for tasks assessed/performed   PT - Cognitive impairments: No apparent impairments                       PT - Cognition Comments: not formally assessed, pt able to answer al questions appropriately and followed commands well in session Following commands: Intact      Cueing Cueing Techniques: Verbal cues  Exercises Other Exercises Other  Exercises: hip lift from EOB x5 Other Exercises: sit-stand x6 from EOB with modA of 2 and BUE support on RW Other Exercises: mini squat x5 on LLE with modA of 2 and RW    General Comments General comments (skin integrity, edema, etc.): VSS on RA, wife present and supportive      Pertinent Vitals/Pain Pain Assessment Pain Assessment: No/denies pain Faces Pain Scale: No hurt Pain Intervention(s): Monitored during session    Home Living                          Prior Function            PT Goals (current goals can now be found in the care plan section) Acute Rehab PT Goals Patient Stated Goal: to return to transferring without physical assistance PT Goal Formulation: With patient/family Time For Goal Achievement: 03/07/24 Potential to Achieve Goals: Fair Progress towards PT goals: Progressing toward goals    Frequency    Min 2X/week      PT Plan      Co-evaluation              AM-PAC PT 6 Clicks Mobility   Outcome Measure  Help needed turning from your back to your side while in a flat bed without using bedrails?: A Little Help needed moving from lying on your back to sitting on the side of a flat bed without using bedrails?: A Lot Help needed moving to and from a bed to a chair (including a wheelchair)?: A Lot Help needed standing up from a chair using your arms (e.g., wheelchair or bedside chair)?: A Lot Help needed to walk in hospital room?: Total Help needed climbing 3-5 steps with a railing? : Total 6 Click Score: 11    End of Session Equipment Utilized During Treatment: Gait belt Activity Tolerance: Patient tolerated treatment well Patient left: in bed;with call bell/phone within reach;with bed alarm set;with family/visitor present Nurse Communication: Need for lift equipment;Mobility status PT Visit Diagnosis: Other abnormalities of gait and mobility (R26.89);Muscle weakness (generalized) (M62.81);History of falling (Z91.81)     Time:  8843-8767 PT Time Calculation (min) (ACUTE ONLY): 36 min  Charges:    $Therapeutic Exercise: 23-37 mins PT General Charges $$ ACUTE PT VISIT: 1 Visit                     Izetta Call, PT, DPT   Acute Rehabilitation Department Office [phone] Secure Chat Communication Preferred   Izetta JULIANNA Call 02/24/2024, 2:18 PM

## 2024-02-24 NOTE — Plan of Care (Signed)
   Problem: Clinical Measurements: Goal: Respiratory complications will improve Outcome: Progressing Goal: Cardiovascular complication will be avoided Outcome: Progressing   Problem: Nutrition: Goal: Adequate nutrition will be maintained Outcome: Progressing   Problem: Coping: Goal: Level of anxiety will decrease Outcome: Progressing   Problem: Elimination: Goal: Will not experience complications related to urinary retention Outcome: Progressing   Problem: Pain Managment: Goal: General experience of comfort will improve and/or be controlled Outcome: Progressing   Problem: Safety: Goal: Ability to remain free from injury will improve Outcome: Progressing

## 2024-02-24 NOTE — Progress Notes (Signed)
 Progress Note   Patient: Levi [name] FMW:[id number] DOB: 1946-03-30 DOA: 02/21/2024     3 DOS: the patient was seen and examined on 02/24/2024   Brief hospital course: Levi [name] is a 78 y.o. male with medical history significant of hypertension, hyperlipidemia, GERD, paroxysmal A-fib on Eliquis , superficial vein thrombosis, iron  deficiency anemia, HFmrEF, depression, COPD, BPH, right foot osteomyelitis status post right BKA, stage I sacral pressure ulcer, peripheral neuropathy.  Last hospital admission 8/1-8//2025 for mild cellulitis of BKA stump and HCAP.  Patient presents to the ED today via EMS with complaints of generalized weakness, fever, and cough.  Temperature 100.4 F oral and he was given Tylenol  by EMS.  Also noted to have soft blood pressures in the range of 90s over 60s with EMS.  Patient endorsed some mild pain in the posterior aspect of his right BKA stump.  His stage I sacral wound did not appear infected.  Patient noted to have a noninfected skin tear to his left thigh, nonhealing wound to the medial aspect of his right stump, and mild swelling of his left great toe.  He was recently treated with antibiotics for URI symptoms.  Labs notable for WBC count 13.7, hemoglobin 9.2 (previously ranging between 10.8-12.2 on labs last month), MCV 89.5, potassium 3.0, magnesium  1.6, creatinine 1.1 (baseline 0.6-0.8), calcium  8.2, albumin 2.1, normal LFTs, COVID/influenza/RSV PCR negative, UA pending, blood cultures in process, lactic acid 2.7.  Chest x-ray showing left lower lobe consolidation concerning for pneumonia.  X-ray of left foot negative for acute fracture or evidence of osteomyelitis.  X-ray of right knee negative for signs of osteomyelitis at amputation stump site.  Patient was given oral potassium, azithromycin , cefepime , metronidazole , vancomycin , 2 L LR, and IV mag in the ED.  TRH called to admit.    Assessment and plan  # Sepsis secondary to MRSA bacteremia Presenting with cough,  fever, leukocytosis, lactic acidosis, and soft blood pressures. Chest x-ray showing left lower lobe consolidation concerning for pneumonia.  COVID/influenza/RSV PCR negative.   - Lactate resolved  - Afebrile.  Leukocytosis resolved - BCx (9/27) positive for MRSA, final cultures pending - CT chest (9/29) showed increased patchy atelectasis or infiltrate at the lung bases with trace right pleural effusion and small left pleural effusion. - ID following - indicated low suspicion for CAP, recommended abx revision from Vanc and cefepime  to daptomycin. Added augmentin  on 9/30 due to concern for abscess formation at left buttocks - BCx (9/29) - NGTD - TTE showed LVEF 40-45%, moderate to severe MR, mild AS, and noted a linear structure on the aortic valve without clear independent movement - CT pelvis w/ contrast showed diffuse subcutaneous edema with induration of the soft tissues of the posterior medial left buttock with no drainable fluid collection or abscess and no soft tissue gas. - Continue Daptomycin and Augmentin  - TEE tentatively scheduled for tomorrow   # Mild AKI - resolved - Likely prerenal in etiology in the setting of sepsis.  - Creatinine down to 0.7 from 1.1 on admission (baseline 0.6-0.8).     # Hypokalemia - resolved   # COPD - Stable, no signs of acute exacerbation.   - He is on chronic steroids, reportedly Prednisone  20 mg daily for many years.  The pulmonologist who initially prescribed this has retired. Patient has not yet establish care with a new pulmonologist.  - Patient was previously on PJP prophylaxis with Bactrim  from 03/2023 to 06/2023.  Patient's wife cannot recall why it was  stopped. - Continue prednisone  20 mg daily - Continue home inhalers.  # Hypertension - BP improved after fluid resuscitation - SBP ranging from 120s to 130s - Continue to hold home antihypertensives at this time  # Right rotator cuff injury  Right shoulder pain - PRN analgesics with  Tylenol  and oxycodone   # Hypomagnesemia - resolved   # Generalized weakness Patient has had a decline in strength and mobility, and requires more assistance that his wife is able to provide him at home on a regular basis. - PT/OT eval for rehab needs - recommending acute inpatient rehab - Was screened for CIR but was found not to meet medical necessity criteria -- other rehab options recommended - Fall precautions   # Chronic HFmrEF - TEE done in October 2024 showing EF 40 to 45%, mild mitral regurgitation, and trivial to mild aortic regurgitation.   - TTE (02/23/2024) showed LVEF 40 to 45%, moderate to severe MR, mild AS. - Undergoing TEE tentatively on 10/1 for evaluation of linear structure seen on TTE about the AV without clear independent movement - Clinically euvolemic   # Paroxysmal A-fib - Currently in sinus rhythm - Continue Eliquis  - Continue home Cardizem    # Chronic iron  deficiency anemia - Hemoglobin 9.5, stable (previously ranging between 10.8-12.2 on labs last month).   - Patient is on chronic anticoagulation but not endorsing any symptoms of GI bleed or any other bleeding.   - Continue home iron  supplement.   # Stage I sacral pressure wound - Wound care.   # Hyperlipidemia - Continue Lipitor.   # GERD - Continue Protonix .  # BPH - Continue Flomax    # Peripheral neuropathy - Continue Lyrica   Assessment and Plan: No notes have been filed under this hospital service. Service: Hospitalist       Subjective: Patient seen this morning.  No acute events overnight.  Patient's wife reports that he was more confused this morning but was now back at baseline.  Patient has no complaints.  Denies any chest pain, shortness of breath, fevers, chills, abdominal pain.  Working with PT and doing well.  Physical Exam: Vitals:   02/23/24 2324 02/24/24 0430 02/24/24 0759 02/24/24 0814  BP: 129/68 138/79 121/68   Pulse: 79 (!) 53 94   Resp: 18 18 16    Temp: (!) 97.5  F (36.4 C) 98.6 F (37 C) 98.3 F (36.8 C)   TempSrc: Oral Oral Oral   SpO2: 98%  93% 92%  Weight:      Height:       Physical Exam Constitutional:      General: He is not in acute distress.    Appearance: He is not ill-appearing.  HENT:     Mouth/Throat:     Mouth: Mucous membranes are moist.  Eyes:     Pupils: Pupils are equal, round, and reactive to light.  Cardiovascular:     Rate and Rhythm: Normal rate and regular rhythm.     Heart sounds: Normal heart sounds. No murmur heard. Pulmonary:     Effort: Pulmonary effort is normal. No respiratory distress.     Breath sounds: Normal breath sounds. No wheezing.  Abdominal:     General: Bowel sounds are normal. There is no distension.     Palpations: Abdomen is soft.     Tenderness: There is no abdominal tenderness. There is no guarding.  Musculoskeletal:     Right lower leg: No edema.     Left lower leg: No edema.  Comments: Right BKA stump clean without erythema, warmth, tenderness, or discharge.  Right-sided gluteal sacral ulcer stage 1 with some induration and tenderness but without skin breakdown or fluctuance.     Right Lower Extremity: Right leg is amputated below knee.  Skin:    General: Skin is warm and dry.     Capillary Refill: Capillary refill takes less than 2 seconds.     Comments: Bilateral extremity senile purpura  Neurological:     Mental Status: He is alert and oriented to person, place, and time. Mental status is at baseline.    Family Communication: Patient's wife, [name], was updated at bedside  Disposition: Status is: Inpatient Remains inpatient appropriate because: MRSA bacteremia requiring IV antibiotics   Planned Discharge Destination: pending acute inpatient rehab offers    Time spent: 45  Author: Duffy Larch, MD 02/24/2024 5:11 PM  For on call review [email or web address].

## 2024-02-24 NOTE — H&P (View-Only) (Signed)
 Progress Note   Patient: Levi [name] FMW:[id number] DOB: 1946-03-30 DOA: 02/21/2024     3 DOS: the patient was seen and examined on 02/24/2024   Brief hospital course: Levi [name] is a 78 y.o. male with medical history significant of hypertension, hyperlipidemia, GERD, paroxysmal A-fib on Eliquis , superficial vein thrombosis, iron  deficiency anemia, HFmrEF, depression, COPD, BPH, right foot osteomyelitis status post right BKA, stage I sacral pressure ulcer, peripheral neuropathy.  Last hospital admission 8/1-8//2025 for mild cellulitis of BKA stump and HCAP.  Patient presents to the ED today via EMS with complaints of generalized weakness, fever, and cough.  Temperature 100.4 F oral and he was given Tylenol  by EMS.  Also noted to have soft blood pressures in the range of 90s over 60s with EMS.  Patient endorsed some mild pain in the posterior aspect of his right BKA stump.  His stage I sacral wound did not appear infected.  Patient noted to have a noninfected skin tear to his left thigh, nonhealing wound to the medial aspect of his right stump, and mild swelling of his left great toe.  He was recently treated with antibiotics for URI symptoms.  Labs notable for WBC count 13.7, hemoglobin 9.2 (previously ranging between 10.8-12.2 on labs last month), MCV 89.5, potassium 3.0, magnesium  1.6, creatinine 1.1 (baseline 0.6-0.8), calcium  8.2, albumin 2.1, normal LFTs, COVID/influenza/RSV PCR negative, UA pending, blood cultures in process, lactic acid 2.7.  Chest x-ray showing left lower lobe consolidation concerning for pneumonia.  X-ray of left foot negative for acute fracture or evidence of osteomyelitis.  X-ray of right knee negative for signs of osteomyelitis at amputation stump site.  Patient was given oral potassium, azithromycin , cefepime , metronidazole , vancomycin , 2 L LR, and IV mag in the ED.  TRH called to admit.    Assessment and plan  # Sepsis secondary to MRSA bacteremia Presenting with cough,  fever, leukocytosis, lactic acidosis, and soft blood pressures. Chest x-ray showing left lower lobe consolidation concerning for pneumonia.  COVID/influenza/RSV PCR negative.   - Lactate resolved  - Afebrile.  Leukocytosis resolved - BCx (9/27) positive for MRSA, final cultures pending - CT chest (9/29) showed increased patchy atelectasis or infiltrate at the lung bases with trace right pleural effusion and small left pleural effusion. - ID following - indicated low suspicion for CAP, recommended abx revision from Vanc and cefepime  to daptomycin. Added augmentin  on 9/30 due to concern for abscess formation at left buttocks - BCx (9/29) - NGTD - TTE showed LVEF 40-45%, moderate to severe MR, mild AS, and noted a linear structure on the aortic valve without clear independent movement - CT pelvis w/ contrast showed diffuse subcutaneous edema with induration of the soft tissues of the posterior medial left buttock with no drainable fluid collection or abscess and no soft tissue gas. - Continue Daptomycin and Augmentin  - TEE tentatively scheduled for tomorrow   # Mild AKI - resolved - Likely prerenal in etiology in the setting of sepsis.  - Creatinine down to 0.7 from 1.1 on admission (baseline 0.6-0.8).     # Hypokalemia - resolved   # COPD - Stable, no signs of acute exacerbation.   - He is on chronic steroids, reportedly Prednisone  20 mg daily for many years.  The pulmonologist who initially prescribed this has retired. Patient has not yet establish care with a new pulmonologist.  - Patient was previously on PJP prophylaxis with Bactrim  from 03/2023 to 06/2023.  Patient's wife cannot recall why it was  stopped. - Continue prednisone  20 mg daily - Continue home inhalers.  # Hypertension - BP improved after fluid resuscitation - SBP ranging from 120s to 130s - Continue to hold home antihypertensives at this time  # Right rotator cuff injury  Right shoulder pain - PRN analgesics with  Tylenol  and oxycodone   # Hypomagnesemia - resolved   # Generalized weakness Patient has had a decline in strength and mobility, and requires more assistance that his wife is able to provide him at home on a regular basis. - PT/OT eval for rehab needs - recommending acute inpatient rehab - Was screened for CIR but was found not to meet medical necessity criteria -- other rehab options recommended - Fall precautions   # Chronic HFmrEF - TEE done in October 2024 showing EF 40 to 45%, mild mitral regurgitation, and trivial to mild aortic regurgitation.   - TTE (02/23/2024) showed LVEF 40 to 45%, moderate to severe MR, mild AS. - Undergoing TEE tentatively on 10/1 for evaluation of linear structure seen on TTE about the AV without clear independent movement - Clinically euvolemic   # Paroxysmal A-fib - Currently in sinus rhythm - Continue Eliquis  - Continue home Cardizem    # Chronic iron  deficiency anemia - Hemoglobin 9.5, stable (previously ranging between 10.8-12.2 on labs last month).   - Patient is on chronic anticoagulation but not endorsing any symptoms of GI bleed or any other bleeding.   - Continue home iron  supplement.   # Stage I sacral pressure wound - Wound care.   # Hyperlipidemia - Continue Lipitor.   # GERD - Continue Protonix .  # BPH - Continue Flomax    # Peripheral neuropathy - Continue Lyrica   Assessment and Plan: No notes have been filed under this hospital service. Service: Hospitalist       Subjective: Patient seen this morning.  No acute events overnight.  Patient's wife reports that he was more confused this morning but was now back at baseline.  Patient has no complaints.  Denies any chest pain, shortness of breath, fevers, chills, abdominal pain.  Working with PT and doing well.  Physical Exam: Vitals:   02/23/24 2324 02/24/24 0430 02/24/24 0759 02/24/24 0814  BP: 129/68 138/79 121/68   Pulse: 79 (!) 53 94   Resp: 18 18 16    Temp: (!) 97.5  F (36.4 C) 98.6 F (37 C) 98.3 F (36.8 C)   TempSrc: Oral Oral Oral   SpO2: 98%  93% 92%  Weight:      Height:       Physical Exam Constitutional:      General: He is not in acute distress.    Appearance: He is not ill-appearing.  HENT:     Mouth/Throat:     Mouth: Mucous membranes are moist.  Eyes:     Pupils: Pupils are equal, round, and reactive to light.  Cardiovascular:     Rate and Rhythm: Normal rate and regular rhythm.     Heart sounds: Normal heart sounds. No murmur heard. Pulmonary:     Effort: Pulmonary effort is normal. No respiratory distress.     Breath sounds: Normal breath sounds. No wheezing.  Abdominal:     General: Bowel sounds are normal. There is no distension.     Palpations: Abdomen is soft.     Tenderness: There is no abdominal tenderness. There is no guarding.  Musculoskeletal:     Right lower leg: No edema.     Left lower leg: No edema.  Comments: Right BKA stump clean without erythema, warmth, tenderness, or discharge.  Right-sided gluteal sacral ulcer stage 1 with some induration and tenderness but without skin breakdown or fluctuance.     Right Lower Extremity: Right leg is amputated below knee.  Skin:    General: Skin is warm and dry.     Capillary Refill: Capillary refill takes less than 2 seconds.     Comments: Bilateral extremity senile purpura  Neurological:     Mental Status: He is alert and oriented to person, place, and time. Mental status is at baseline.    Family Communication: Patient's wife, Levi [name], was updated at bedside  Disposition: Status is: Inpatient Remains inpatient appropriate because: MRSA bacteremia requiring IV antibiotics   Planned Discharge Destination: pending acute inpatient rehab offers    Time spent: 45  Author: Duffy Larch, MD 02/24/2024 5:11 PM  For on call review [email or web address].

## 2024-02-24 NOTE — Progress Notes (Signed)
 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 Work up this admission has shown that the patient has MRSA bacteremia.   MRSA Bacteremia Extensive Hardware to his knees, hips, left ankle, left shoulder and back  Pain and Induration to his Left Buttock COPD and he is on Prednisone  20mg  PO daily   - The patient initially received Vancomycin , Cefepime  and Metronidazole  due to concern for a possible pneumonia. Pneumonia seems unlikely so all antibiotics were stopped  - He is currently on Daptomycin for his MRSA bacteremia  - Echocardiogram to rule out endocarditis - TEE planned for tomorrow  - Repeat blood cultures on 9/29 are currently NGTD  - Closely monitor for worsening joint pain given his extensive hardware  - Maintain contact isolation precaution - For right shoulder future replacement, recommend not doing for at least 6 months beyond treatment of current MRSA bacteremia   - For his left buttock pain with erythema and induration, CT pelvis on 9/29 showed subcutaneous edema and induration - Will start Augmentin  today and keep a close eye on it. Worried about possible abscess formation    Patient Active Problem List   Diagnosis Date Noted   MRSA bacteremia 02/22/2024   Left lower lobe pneumonia 02/21/2024   AKI (acute kidney injury) 02/21/2024   Hypokalemia 02/21/2024   Sacral wound, initial encounter  02/20/2024   Sepsis (HCC) 03/18/2023   Primary osteoarthritis, right shoulder 07/05/2022   BPH (benign prostatic hyperplasia) 02/21/2022   Acquired absence of right leg below knee (HCC) 01/22/2022   Peripheral neuropathy 02/22/2021   Chronic systolic heart failure (HCC) 02/22/2021   COPD (chronic obstructive pulmonary disease) (HCC) 03/21/2020   GERD (gastroesophageal reflux disease) status post Nissen fundoplication 12/03/2017   Hyperlipidemia    Paroxysmal atrial fibrillation (HCC) 05/11/2012   Asthma, moderate persistent 03/06/2009   Current Discharge Medication List     CONTINUE these medications which have NOT CHANGED   Details  albuterol  (VENTOLIN  HFA) 108 (90 Base) MCG/ACT inhaler INHALE 2 PUFFS BY MOUTH EVERY 4 HOURS AS NEEDED FOR WHEEZE OR FOR SHORTNESS OF BREATH Qty: 8 each, Refills: 4    apixaban  (ELIQUIS ) 5 MG TABS tablet Take 1 tablet (5 mg total) by mouth 2 (two) times daily. Qty: 180 tablet, Refills: 1   Associated Diagnoses: Paroxysmal atrial fibrillation (HCC)    atorvastatin  (LIPITOR) 20 MG tablet Take 1 tablet (20 mg total) by mouth daily. Qty: 90 tablet, Refills: 3    cyclobenzaprine  (FLEXERIL ) 10 MG tablet TAKE 1 TABLET BY MOUTH THREE TIMES A DAY AS NEEDED FOR MUSCLE SPASMS Qty: 90 tablet, Refills: 3    diltiazem  (CARDIZEM  CD) 180 MG 24 hr capsule Take 1 capsule (180 mg total) by mouth daily. Qty: 90 capsule, Refills: 3   Comments: Please d/c any  RX on file for Diltiazem  - med change    diltiazem  (CARDIZEM ) 30 MG tablet Take 30 mg by mouth daily as needed (for high HR).    ferrous gluconate  (FERGON) 324 MG tablet Take 1 tablet (324 mg total) by mouth daily. Qty: 30 tablet, Refills: 0    ipratropium-albuterol  (DUONEB) 0.5-2.5 (3) MG/3ML SOLN 1 NEBULE EVERY 6 HOURS AS NEEDED. **J45.3** Qty: 270 mL, Refills: 4    isosorbide  mononitrate (IMDUR ) 30 MG 24 hr tablet Take 1 tablet (30 mg total) by mouth daily. Qty: 90 tablet, Refills: 3    Olodaterol HCl  (STRIVERDI RESPIMAT) 2.5 MCG/ACT AERS INHALE 2 PUFFS BY MOUTH INTO THE LUNGS DAILY Qty: 4 g, Refills: 11    pantoprazole  (PROTONIX ) 40 MG tablet Take 1 tablet (40 mg total) by mouth 2 (two) times daily before a meal. Qty: 60 tablet, Refills: 0   Associated Diagnoses: Gastroesophageal reflux disease without esophagitis    predniSONE  (DELTASONE ) 5 MG tablet 3 tabs daily Qty: 100 tablet, Refills: 5    pregabalin  (LYRICA ) 150 MG capsule Take 1 capsule (150 mg total) by mouth 2 (two) times daily. Qty: 60 capsule, Refills: 2   Associated Diagnoses: Neuropathy    tamsulosin  (FLOMAX ) 0.4 MG CAPS capsule Take 1 capsule (0.4 mg total) by mouth at bedtime. Qty: 30 capsule, Refills: 0       Subjective: The patient is lying in bed in no apparent distress. He denies any complaints at this time. A little confused today. Does not remember coming to the hospital and did not realize he was in the hospital. He still complains of intermittent left buttock pain. He looked a little flushed today but was afebrile. No acute events overnight    Review of Systems: Constitutional: Negative for fever, chills, fatigue or unexpected weight change. HENT: Negative for rhinorrhea and sore throat. Eyes: Negative for visual disturbance. Respiratory: Negative for cough and shortness of breath. Cardiovascular: Negative for chest pain and palpitations. Gastrointestinal: Negative for abdominal pain, nausea, vomiting and diarrhea. Genitourinary: Negative for dysuria and hematuria. Left buttock pain  Musculoskeletal: Negative for arthralgias and joint swelling. Skin: Negative for rash.  Past Medical History:  Diagnosis Date   Agent orange exposure 1970's   takes Imdur ,Diltiazem , and Atacand  daily   Agent orange exposure    ALLERGIC RHINITIS    Allergy     seasonal allergies   Anemia    iron  deficiency   Arthritis    Asthma    uses inhaler and nebulizers   Atrial fibrillation (HCC)    Bruises easily    d/t  prednisone  daily   Cataract    Cellulitis 03/22/2020   Chest wall pain 09/21/2019   L scapula, reported 4/27 21/     CHF (congestive heart failure) (HCC)    dx-on meds to treat   Chronic bronchitis (HCC)    used to get it q yr; last time ?2008 (04/28/2012)   Clotting disorder 2019   On blood thinner when I bleed it is for a consideral amount of time. This is tegardless of size of wound.   COPD (chronic obstructive pulmonary disease) (HCC)    agent orange exposure   Degenerative disk disease    everywhere (04/28/2012)   Depression due to physical illness 12/15/2023   Diverticulitis    Dysrhythmia    afib   Elevated uric acid in blood    takes Allopurinol  daily   Emphysema of lung (HCC)    Enlarged prostate    but not  on any meds   Essential (primary) hypertension 11/15/2020   GERD (gastroesophageal reflux disease)    takes Protonix  daily   H/O hiatal hernia    Headache    History of colonic polyps 02/09/2013   01/2013 - 3 adenomas max 8 mm  01/2015 2 diminutive adenomas - repeat colon 2021     History of colonoscopy    History of pneumonia    a. 2010   History of total right hip arthroplasty 07/31/2015   Hyperlipidemia    takes Lipitor daily; pt. states he takes a preventive   Hypothyroidism    Joint pain    Joint swelling    Leukocytosis    Lumbar stenosis status post T12-S1 fusion 04/07/2017   MSSA bacteremia 03/19/2023   Nausea and vomiting 11/12/2022   Neuromuscular disorder (HCC)    bilat legs, and bilat arms   Neuropathy    Osteoarthritis of left knee 01/10/2012   Osteoporosis 2012   PAF (paroxysmal atrial fibrillation) (HCC)    a. Dx 04/2012, CHA2DS2VASc = 1 (age);  b. 04/2012 Echo: EF 40-50%, mild MR.   Personal history of colonic adenomas 02/09/2013   Pneumonia    Primary osteoarthritis of right knee 06/17/2014   Sepsis (HCC) 03/18/2023   Shortness of breath dyspnea    Thyroid  disease    no taking meds-caused dizziness   Social History   Tobacco  Use   Smoking status: Never   Smokeless tobacco: Never  Vaping Use   Vaping status: Never Used  Substance Use Topics   Alcohol use: No   Drug use: No   Family History  Problem Relation Age of Onset   Diabetes Mother    Heart disease Father        Died 40, MI   Cancer Father    Hyperlipidemia Father    Stroke Father    Prostate cancer Brother    Colon polyps Brother    Diabetes Brother    Colon polyps Brother    Asthma Brother    COPD Brother    Colon cancer Brother        Dx age 108   Cancer Brother    Colon cancer Other    Esophageal cancer Neg Hx    Rectal cancer Neg Hx    Stomach cancer Neg Hx    Pancreatic cancer Neg Hx    Kidney disease Neg Hx    Liver disease Neg Hx    Neuropathy Neg Hx    Allergies  Allergen Reactions   Levaquin  [Levofloxacin ] Itching    Tolerated ciprofloxacin  in 2022.   Objective: Vitals:   02/23/24 2324 02/24/24 0430 02/24/24 0759 02/24/24 0814  BP: 129/68 138/79 121/68   Pulse: 79 (!) 53 94   Resp: 18 18 16    Temp: (!) 97.5 F (36.4 C) 98.6 F (37 C) 98.3 F (36.8 C)   TempSrc: Oral Oral Oral   SpO2: 98%  93% 92%  Weight:      Height:       Body mass index is 24.39 kg/m.  General: Well developed, well nourished male in no apparent distress HENT: Moist mucous membranes, normal nose, normal external ears, and normocephalic. Appeared flushed today from the neck up Neck: Supple, trachea midline, and normal cervical range of motion Eyes: PERRL, EOMI, non-icteric, and normal conjunctivae and lids Lungs: Clear to auscultation bilaterally. No wheezing, rales or rhonchi Cardiac: Regular rate and rhythm. No murmurs, rubs or gallops. No peripheral edema Abdomen: Soft, Non distended,  Non tender, active bowel sounds Skin: Intact, no focal erythema or rash, and warm and dry GU: Left buttock with induration and some edema noted  Musculoskeletal: No obvious skeletal abnormalities. Good range of motion with all extremities without  associated pain Neuro: Alert, no focal neurologic deficits, moves all extremities Psych: Cooperative. A little confused today    Problem List Items Addressed This Visit       Respiratory   Left lower lobe pneumonia - Primary   Relevant Medications   guaiFENesin -dextromethorphan (ROBITUSSIN DM) 100-10 MG/5ML syrup 5 mL   ipratropium-albuterol  (DUONEB) 0.5-2.5 (3) MG/3ML nebulizer solution 3 mL   arformoterol  (BROVANA ) nebulizer solution 15 mcg   DAPTOmycin (CUBICIN) IVPB 700 mg/100mL premix   amoxicillin -clavulanate (AUGMENTIN ) 875-125 MG per tablet 1 tablet     Other   Hypokalemia   Other Visit Diagnoses       Generalized weakness         Hypomagnesemia          Evalene CHRISTELLA Munch, MD [facility] 02/24/2024, 2:09 PM

## 2024-02-24 NOTE — TOC Initial Note (Signed)
 Transition of Care [facility]) - Initial/Assessment Note    Patient Details  Name: Levi [name] MRN: [id number] Date of Birth: 15-Oct-1945  [facility] CM/SW Contact:    Sudie Erminio Deems, RN Phone Number: 02/24/2024, 4:12 PM  Clinical Narrative: Patient presented for generalized weakness. PTA patient was from home with spouse. Inpatient Case Manager spoke with spouse and she states patient was having difficulty in the home transferring from the bed to wheelchair. Spouse states patient has had previous issues with his rotator cuff. Patient was active with [facility] for PT/OT and the doctors office has arranged a HH RN to visit in the home. Spouse feels that patient will benefit from CIR before he returns home. Spouse states patient was previously at CIR and did well in the program and she hopes he can go back to CIR. Inpatient Case Manager will continue to follow for disposition needs. PT/OT recommendations are for CIR.    Expected Discharge Plan: IP Rehab Facility Barriers to Discharge: Continued Medical Work up   Patient Goals and CMS Choice            Expected Discharge Plan and Services In-house Referral: NA Discharge Planning Services: CM Consult   Living arrangements for the past 2 months: Single Family Home                   DME Agency: NA                  Prior Living Arrangements/Services Living arrangements for the past 2 months: Single Family Home Lives with:: Spouse Patient language and need for interpreter reviewed:: Yes Do you feel safe going back to the place where you live?: Yes      Need for Family Participation in Patient Care: Yes (Comment) Care giver support system in place?: Yes (comment) Current home services: DME, Home PT, Home OT, Home RN (wheelchair, hospital bed, slideboard. Active with [facility]) Criminal Activity/Legal Involvement Pertinent to Current Situation/Hospitalization: No - Comment as  needed  Activities of Daily Living   ADL Screening (condition at time of admission) Independently performs ADLs?: No Does the patient have a NEW difficulty with bathing/dressing/toileting/self-feeding that is expected to last >3 days?: Yes (Initiates electronic notice to provider for possible OT consult) Does the patient have a NEW difficulty with getting in/out of bed, walking, or climbing stairs that is expected to last >3 days?: Yes (Initiates electronic notice to provider for possible PT consult) Does the patient have a NEW difficulty with communication that is expected to last >3 days?: No Is the patient deaf or have difficulty hearing?: No Does the patient have difficulty seeing, even when wearing glasses/contacts?: No Does the patient have difficulty concentrating, remembering, or making decisions?: No  Permission Sought/Granted Permission sought to share information with : Family Supports, Case Manager                Emotional Assessment Appearance:: Appears stated age Attitude/Demeanor/Rapport: Unable to Assess Affect (typically observed): Unable to Assess   Alcohol / Substance Use: Not Applicable Psych Involvement: No (comment)  Admission diagnosis:  Hypokalemia [E87.6] Hypomagnesemia [E83.42] CAP (community acquired pneumonia) [J18.9] Left lower lobe pneumonia [J18.9] Generalized weakness [R53.1] Pneumonia of left lower lobe due to infectious organism [J18.9] Patient Active Problem List   Diagnosis Date Noted   MRSA bacteremia 02/22/2024   Left lower lobe pneumonia 02/21/2024   AKI (acute kidney injury) 02/21/2024   Hypokalemia 02/21/2024   Sacral wound, initial  encounter 02/20/2024   Sepsis (HCC) 03/18/2023   Primary osteoarthritis, right shoulder 07/05/2022   BPH (benign prostatic hyperplasia) 02/21/2022   Acquired absence of right leg below knee (HCC) 01/22/2022   Peripheral neuropathy 02/22/2021   Chronic systolic heart failure (HCC) 02/22/2021   COPD  (chronic obstructive pulmonary disease) (HCC) 03/21/2020   GERD (gastroesophageal reflux disease) status post Nissen fundoplication 12/03/2017   Hyperlipidemia    Paroxysmal atrial fibrillation (HCC) 05/11/2012   Asthma, moderate persistent 03/06/2009   PCP:  Jerrell Cleatus Ned, MD Pharmacy:   [id number] - [place], [place] - [address] [place] [address] [place] [address] [place] [address] Phone: [phone] Fax: [phone]  [name] [address] [place] [address] Phone: [phone] Fax: [phone]     Social Drivers of Health (SDOH) Social History: SDOH Screenings   Food Insecurity: No Food Insecurity (02/22/2024)  Housing: Low Risk  (02/22/2024)  Transportation Needs: No Transportation Needs (02/22/2024)  Recent Concern: Transportation Needs - Unmet Transportation Needs (02/05/2024)  Utilities: Not At Risk (02/22/2024)  Alcohol Screen: Low Risk  (10/23/2023)  Depression (PHQ2-9): High Risk (02/09/2024)  Financial Resource Strain: Low Risk  (02/05/2024)  Physical Activity: Inactive (02/05/2024)  Social Connections: Moderately Integrated (02/22/2024)  Stress: No Stress Concern Present (02/05/2024)  Tobacco Use: Low Risk  (02/21/2024)  Health Literacy: Adequate Health Literacy (10/23/2023)   SDOH Interventions:     Readmission Risk Interventions     No data to display

## 2024-02-24 NOTE — Progress Notes (Addendum)
   [facility] has been requested to perform a transesophageal echocardiogram on Levi [name] for mitral regurgitation and bacteremia.  Patient does have a history of dysphagia 2/2 esophageal candidiasis & benign esophageal stenosis. He does follow with GI. In 2022 underwent dilation of the esophagus. In 2023 underwent an UGI which showed mild esophageal motility and a small hiatal hernia. He subsequently had a fundoplication 09/2021. Patient shared his dysphagia and GERD symptoms have improved since then, and at baseline since his TEE 02/2023. He has had one episode of thrush since then, has resolved with course of nystatin. Additionally he has extensive hardware including his neck causing limited neck mobility.   Patient underwent TEE 10/ 2024 without issues.   Spoke with Dr. Jeffrie who will be preforming the procedure, okay to proceed without further GI work-up.    Relative Contraindications: History of dysphagia and Restriction of neck mobility (severe cervical arthritis)  The patient has: History of Reduced Ejection Fraction and History of Severe Valve Disease (stenosis or regurgitation) Echocardiogram this admission shows chronic LVEF 40-45%. New moderate to severe MR.      After careful review of history and examination, the risks and benefits of transesophageal echocardiogram have been explained including risks of esophageal damage, perforation (1:10,000 risk), bleeding, pharyngeal hematoma as well as other potential complications associated with conscious sedation including aspiration, arrhythmia, respiratory failure and death. Alternatives to treatment were discussed, questions were answered. Patient is willing to proceed.   Signed, Leontine LOISE Salen, PA-C  02/24/2024 11:50 AM

## 2024-02-25 ENCOUNTER — Inpatient Hospital Stay (HOSPITAL_COMMUNITY): Admitting: Registered Nurse

## 2024-02-25 ENCOUNTER — Encounter (HOSPITAL_COMMUNITY): Admission: EM | Disposition: A | Discharge status: Rehabilitation Facility | Payer: Self-pay | Source: Home / Self Care

## 2024-02-25 ENCOUNTER — Telehealth: Payer: Self-pay

## 2024-02-25 ENCOUNTER — Inpatient Hospital Stay (HOSPITAL_COMMUNITY)

## 2024-02-25 DIAGNOSIS — R531 Weakness: Secondary | ICD-10-CM

## 2024-02-25 DIAGNOSIS — R7881 Bacteremia: Secondary | ICD-10-CM

## 2024-02-25 DIAGNOSIS — I351 Nonrheumatic aortic (valve) insufficiency: Secondary | ICD-10-CM

## 2024-02-25 DIAGNOSIS — I340 Nonrheumatic mitral (valve) insufficiency: Secondary | ICD-10-CM | POA: Diagnosis not present

## 2024-02-25 DIAGNOSIS — J189 Pneumonia, unspecified organism: Secondary | ICD-10-CM | POA: Diagnosis not present

## 2024-02-25 DIAGNOSIS — B9562 Methicillin resistant Staphylococcus aureus infection as the cause of diseases classified elsewhere: Secondary | ICD-10-CM | POA: Diagnosis not present

## 2024-02-25 DIAGNOSIS — I5022 Chronic systolic (congestive) heart failure: Secondary | ICD-10-CM

## 2024-02-25 DIAGNOSIS — I480 Paroxysmal atrial fibrillation: Secondary | ICD-10-CM

## 2024-02-25 DIAGNOSIS — I110 Hypertensive heart disease with heart failure: Secondary | ICD-10-CM

## 2024-02-25 HISTORY — PX: TRANSESOPHAGEAL ECHOCARDIOGRAM (CATH LAB): EP1270

## 2024-02-25 LAB — BASIC METABOLIC PANEL WITH GFR
Anion gap: 8 (ref 5–15)
BUN: 11 mg/dL (ref 8–23)
CO2: 25 mmol/L (ref 22–32)
Calcium: 8.1 mg/dL — ABNORMAL LOW (ref 8.9–10.3)
Chloride: 105 mmol/L (ref 98–111)
Creatinine, Ser: 0.39 mg/dL — ABNORMAL LOW (ref 0.61–1.24)
GFR, Estimated: >60 mL/min (ref >60)
Glucose, Bld: 99 mg/dL (ref 70–99)
Potassium: 3 mmol/L — ABNORMAL LOW (ref 3.5–5.1)
Sodium: 138 mmol/L (ref 135–145)

## 2024-02-25 LAB — ECHO TEE

## 2024-02-25 LAB — CBC
HCT: 31.4 % — ABNORMAL LOW (ref 39.0–52.0)
Hemoglobin: 9.8 g/dL — ABNORMAL LOW (ref 13.0–17.0)
MCH: 27.8 pg (ref 26.0–34.0)
MCHC: 31.2 g/dL (ref 30.0–36.0)
MCV: 89 fL (ref 80.0–100.0)
Platelets: 194 K/uL (ref 150–400)
RBC: 3.53 MIL/uL — ABNORMAL LOW (ref 4.22–5.81)
RDW: 17.1 % — ABNORMAL HIGH (ref 11.5–15.5)
WBC: 9.4 K/uL (ref 4.0–10.5)
nRBC: 0 % (ref 0.0–0.2)

## 2024-02-25 LAB — MRSA NEXT GEN BY PCR, NASAL: MRSA by PCR Next Gen: DETECTED — AB

## 2024-02-25 SURGERY — TRANSESOPHAGEAL ECHOCARDIOGRAM (TEE) (CATHLAB)
Anesthesia: Monitor Anesthesia Care

## 2024-02-25 MED ORDER — POTASSIUM CHLORIDE CRYS ER 20 MEQ PO TBCR
40.0000 meq | EXTENDED_RELEASE_TABLET | ORAL | Status: AC
  Administered 2024-02-25 (×2): 40 meq via ORAL
  Filled 2024-02-25 (×2): qty 2

## 2024-02-25 MED ORDER — PROPOFOL 10 MG/ML IV BOLUS
INTRAVENOUS | Status: DC | PRN
  Administered 2024-02-25: 30 mg via INTRAVENOUS
  Administered 2024-02-25: 40 mg via INTRAVENOUS

## 2024-02-25 MED ORDER — PHENYLEPHRINE 80 MCG/ML (10ML) SYRINGE FOR IV PUSH (FOR BLOOD PRESSURE SUPPORT)
PREFILLED_SYRINGE | INTRAVENOUS | Status: DC | PRN
  Administered 2024-02-25 (×2): 160 ug via INTRAVENOUS

## 2024-02-25 MED ORDER — SODIUM CHLORIDE 0.9 % IV SOLN: INTRAVENOUS | Status: DC | PRN

## 2024-02-25 MED ORDER — MUPIROCIN 2 % EX OINT: 1.0000 | TOPICAL_OINTMENT | Freq: Two times a day (BID) | CUTANEOUS | Status: DC

## 2024-02-25 MED ORDER — MUPIROCIN 2 % EX OINT
1.0000 | TOPICAL_OINTMENT | Freq: Two times a day (BID) | CUTANEOUS | Status: DC
  Administered 2024-02-25 – 2024-02-27 (×5): 1 via NASAL
  Filled 2024-02-25 (×2): qty 22

## 2024-02-25 MED ORDER — LIDOCAINE 2% (20 MG/ML) 5 ML SYRINGE
INTRAMUSCULAR | Status: DC | PRN
  Administered 2024-02-25: 100 mg via INTRAVENOUS

## 2024-02-25 MED ORDER — SODIUM CHLORIDE 0.45 % IV SOLN: INTRAVENOUS | Status: DC

## 2024-02-25 MED ORDER — PROPOFOL 500 MG/50ML IV EMUL
INTRAVENOUS | Status: DC | PRN
  Administered 2024-02-25: 120 ug/kg/min via INTRAVENOUS

## 2024-02-25 NOTE — Telephone Encounter (Signed)
 Ok with me

## 2024-02-25 NOTE — Telephone Encounter (Signed)
 Copied from CRM [id number]. Topic: Appointments - Transfer of Care >> Feb 25, 2024  8:50 AM Rea ORN wrote: Pt is requesting to transfer FROM: Dr. Jerrell Pt is requesting to transfer TO: [place] Gave Reason for requested transfer: Returning to previous office It is the responsibility of the team the patient would like to transfer to [facility]) to reach out to the patient if for any reason this transfer is not acceptable.

## 2024-02-25 NOTE — CV Procedure (Addendum)
   Transesophageal Echocardiogram  Indications: Fever  Time out performed  During this procedure the patient was administered propofol  under anesthesiology supervision to achieve and maintain moderate sedation.  The patient's heart rate, blood pressure, and oxygen saturation are monitored continuously during the procedure. Did not pass transgastric due to prior fundoplication.  Findings:  Left Ventricle: EF 55-60%  Mitral Valve: Mild MR  Aortic Valve: Trileaflet, sclerosis, no Vegetation, mild regurgitation  Tricuspid Valve: mild TR  Left Atrium: Normal, no left atrial appendage thrombus  Right Atrium: normal  Intraatrial septum: normal  Bubble Contrast Study: not performed  Impressions: No vegetation. No endocarditis.   Oneil Parchment, MD

## 2024-02-25 NOTE — Interval H&P Note (Signed)
 History and Physical Interval Note:  02/25/2024 2:19 PM  Levi [name]  has presented today for surgery, with the diagnosis of Bacteremia.  The various methods of treatment have been discussed with the patient and family. After consideration of risks, benefits and other options for treatment, the patient has consented to  Procedure(s): TRANSESOPHAGEAL ECHOCARDIOGRAM (N/A) as a surgical intervention.  The patient's history has been reviewed, patient examined, no change in status, stable for surgery.  I have reviewed the patient's chart and labs.  Questions were answered to the patient's satisfaction.     [employer]

## 2024-02-25 NOTE — Progress Notes (Signed)
  Inpatient Rehab Admissions Coordinator :  Per therapy recommendations, patient was screened for CIR candidacy by Ottie Glazier RN MSN.  At this time patient appears to be a potential candidate for CIR. I will place a rehab consult per protocol for full assessment. Please call me with any questions.  Ottie Glazier RN MSN Admissions Coordinator [phone]

## 2024-02-25 NOTE — Progress Notes (Signed)
 Progress Note   Patient: Levi [name] FMW:[id number] DOB: Jul 12, 1945 DOA: 02/21/2024     4 DOS: the patient was seen and examined on 02/25/2024   Brief hospital course: 79 y.o. male with medical history significant of hypertension, hyperlipidemia, GERD, paroxysmal A-fib on Eliquis , superficial vein thrombosis, iron  deficiency anemia, HFmrEF, depression, COPD, BPH, right foot osteomyelitis status post right BKA, stage I sacral pressure ulcer, peripheral neuropathy.  Last hospital admission 8/1-8//2025 for mild cellulitis of BKA stump and HCAP.  Patient presents to the ED today via EMS with complaints of generalized weakness, fever, and cough.  Temperature 100.4 F oral and he was given Tylenol  by EMS.  Also noted to have soft blood pressures in the range of 90s over 60s with EMS.  Patient endorsed some mild pain in the posterior aspect of his right BKA stump.  His stage I sacral wound did not appear infected.  Patient noted to have a noninfected skin tear to his left thigh, nonhealing wound to the medial aspect of his right stump, and mild swelling of his left great toe.  He was recently treated with antibiotics for URI symptoms.  Labs notable for WBC count 13.7, hemoglobin 9.2 (previously ranging between 10.8-12.2 on labs last month), MCV 89.5, potassium 3.0, magnesium  1.6, creatinine 1.1 (baseline 0.6-0.8), calcium  8.2, albumin 2.1, normal LFTs, COVID/influenza/RSV PCR negative, UA pending, blood cultures in process, lactic acid 2.7.  Chest x-ray showing left lower lobe consolidation concerning for pneumonia.  X-ray of left foot negative for acute fracture or evidence of osteomyelitis.  X-ray of right knee negative for signs of osteomyelitis at amputation stump site.  Patient was given oral potassium, azithromycin , cefepime , metronidazole , vancomycin , 2 L LR, and IV mag in the ED.  TRH called to admit.   Assessment and Plan: # Sepsis secondary to MRSA bacteremia Presenting with cough, fever, leukocytosis,  lactic acidosis, and soft blood pressures. Chest x-ray showing left lower lobe consolidation concerning for pneumonia.  COVID/influenza/RSV PCR negative.   - Lactate resolved  - Afebrile.  Leukocytosis resolved - BCx (9/27) positive for MRSA, final cultures pending - CT chest (9/29) showed increased patchy atelectasis or infiltrate at the lung bases with trace right pleural effusion and small left pleural effusion. - ID following - indicated low suspicion for CAP, recommended abx revision from Vanc and cefepime  to daptomycin. Added augmentin  on 9/30 due to concern for abscess formation at left buttocks - BCx (9/29) - NGTD - TTE showed LVEF 40-45%, moderate to severe MR, mild AS, and noted a linear structure on the aortic valve without clear independent movement - CT pelvis w/ contrast showed diffuse subcutaneous edema with induration of the soft tissues of the posterior medial left buttock with no drainable fluid collection or abscess and no soft tissue gas. - Continue Daptomycin and Augmentin  - TEE performed 10/1, no endocarditis, no vegetations   # Mild AKI - resolved - Likely prerenal in etiology in the setting of sepsis.  - renal function normalized.     # Hypokalemia  -Potassium low today, will replace   # COPD - Stable, no signs of acute exacerbation.   - He is on chronic steroids, reportedly Prednisone  20 mg daily for many years.  The pulmonologist who initially prescribed this has retired. Patient has not yet establish care with a new pulmonologist.  - Patient was previously on PJP prophylaxis with Bactrim  from 03/2023 to 06/2023.  Patient's wife cannot recall why it was stopped. - Continue prednisone  20 mg daily -  Continue home inhalers.   # Hypertension - BP improved after fluid resuscitation - SBP ranging from 120s to 130s - Continue to hold home antihypertensives at this time   # Right rotator cuff injury  Right shoulder pain - PRN analgesics with Tylenol  and oxycodone     # Hypomagnesemia - resolved   # Generalized weakness Patient has had a decline in strength and mobility, and requires more assistance that his wife is able to provide him at home on a regular basis. - PT/OT eval for rehab needs - recommending acute inpatient rehab - Was screened for CIR but was found not to meet medical necessity criteria -- other rehab options recommended - Fall precautions   # Chronic HFmrEF - TEE done in October 2024 showing EF 40 to 45%, mild mitral regurgitation, and trivial to mild aortic regurgitation.   - TTE (02/23/2024) showed LVEF 40 to 45%, moderate to severe MR, mild AS. - Undergoing TEE tentatively on 10/1 for evaluation of linear structure seen on TTE about the AV without clear independent movement - Clinically euvolemic at this time   # Paroxysmal A-fib - Currently in sinus rhythm - Continue Eliquis  - Continue home Cardizem    # Chronic iron  deficiency anemia - Hemoglobin 9.5, stable (previously ranging between 10.8-12.2 on labs last month).   - Patient is on chronic anticoagulation but not endorsing any symptoms of GI bleed or any other bleeding.   - Continue home iron  supplement.   # Stage I sacral pressure wound - Wound care.   # Hyperlipidemia - Continue Lipitor.   # GERD - Continue Protonix .   # BPH - Continue Flomax    # Peripheral neuropathy - Continue Lyrica       Subjective: Pt seen prior to TEE. Without complaints  Physical Exam: Vitals:   02/25/24 1448 02/25/24 1458 02/25/24 1508 02/25/24 1518  BP: (!) 94/55 101/66 93/64 102/63  Pulse: 79 82 85 80  Resp: 11 14 16 15   Temp: 98 F (36.7 C)     TempSrc: Temporal     SpO2: 100% 100% 96% 98%  Weight:      Height:       General exam: Awake, laying in bed, in nad Respiratory system: Normal respiratory effort, no wheezing Cardiovascular system: regular rate, s1, s2 Gastrointestinal system: Soft, nondistended, positive BS Central nervous system: CN2-12 grossly intact,  strength intact Extremities: Perfused, no clubbing Skin: Normal skin turgor, no notable skin lesions seen Psychiatry: Mood normal // no visual hallucinations   Data Reviewed:  Labs reviewed: Na 138, K 3.0, Cr 0.39, WBC 9.4, Hgb 9.8, Plts 194  Family Communication: Pt in room, family at bedside  Disposition: Status is: Inpatient Remains inpatient appropriate because: severity of illness  Planned Discharge Destination: Home    Author: Garnette Pelt, MD 02/25/2024 3:50 PM  For on call review [email or web address].

## 2024-02-25 NOTE — Progress Notes (Signed)
  Pt arrived from TEE via Bed to HA. Report received from RN and CRNA. Pt vitals are stable, performed q45min see vitals flowsheet. Anesthesia recovery has been uneventful. Pt to transition to 6 [place]. Will continue to monitor patient while under holding area care.

## 2024-02-25 NOTE — Telephone Encounter (Signed)
 FYI patient is requesting transfer back to recent clinic

## 2024-02-25 NOTE — Transfer of Care (Signed)
 Immediate Anesthesia Transfer of Care Note  Patient: Levi [name]  Procedure(s) Performed: TRANSESOPHAGEAL ECHOCARDIOGRAM  Patient Location: Cath Lab  Anesthesia Type:MAC  Level of Consciousness: drowsy and responds to stimulation  Airway & Oxygen Therapy: Patient Spontanous Breathing and Patient connected to nasal cannula oxygen  Post-op Assessment: Report given to RN and Post -op Vital signs reviewed and stable  Post vital signs: Reviewed and stable  Last Vitals:  Vitals Value Taken Time  BP 104/59   Temp    Pulse 86   Resp 20   SpO2 100 %     Last Pain:  Vitals:   02/25/24 1448  TempSrc: Temporal  PainSc: Asleep      Patients Stated Pain Goal: 0 (02/22/24 2040)  Complications: There were no known notable events for this encounter.

## 2024-02-25 NOTE — Plan of Care (Signed)
  Problem: Elimination: Goal: Will not experience complications related to urinary retention Outcome: Not Progressing   Problem: Safety: Goal: Ability to remain free from injury will improve Outcome: Progressing

## 2024-02-25 NOTE — Anesthesia Preprocedure Evaluation (Addendum)
 Anesthesia Evaluation  Patient identified by MRN, date of birth, ID band Patient awake    Reviewed: Allergy  & Precautions, NPO status , Patient's Chart, lab work & pertinent test results  Airway Mallampati: II  TM Distance: >3 FB Neck ROM: Full    Dental  (+) Dental Advisory Given   Pulmonary asthma , COPD   breath sounds clear to auscultation       Cardiovascular hypertension, +CHF  + dysrhythmias Atrial Fibrillation  Rhythm:Regular Rate:Normal       1. Left ventricular ejection fraction, by estimation, is 40 to 45%. The left ventricle has mildly decreased function. The left ventricle demonstrates global hypokinesis. Left ventricular diastolic parameters are consistent with Grade I diastolic dysfunction (impaired relaxation). The average left ventricular global longitudinal strain is -12.7 %. The global longitudinal strain is abnormal.  2. Right ventricular systolic function is normal. The right ventricular size is normal. There is normal pulmonary artery systolic pressure.  3. The mitral valve is normal in structure. Moderate to severe mitral valve regurgitation. No evidence of mitral stenosis.  4. There is a linear structure on the aortic valve seen on the PLAX views. Does not have clear independent movement but does not appear to be artifact. Degree of AR is similar to previous. If high clinical suspiscion, TEE with 3D may be helpful. The aortic valve is tricuspid. Aortic valve regurgitation is mild. Mild aortic valve stenosis.  5. The inferior vena cava is normal in size with greater than 50% respiratory variability, suggesting right atrial pressure of 3 mmHg.      Neuro/Psych  Headaches   Depression       GI/Hepatic Neg liver ROS, hiatal hernia,GERD  ,,  Endo/Other  Hypothyroidism    Renal/GU   negative genitourinary   Musculoskeletal  (+) Arthritis , Osteoarthritis,    Abdominal   Peds  Hematology  (+) Blood  dyscrasia, anemia   Anesthesia Other Findings   Reproductive/Obstetrics                              Anesthesia Physical Anesthesia Plan  ASA: 3  Anesthesia Plan: MAC   Post-op Pain Management:    Induction: Intravenous  PONV Risk Score and Plan: 1 and Propofol  infusion and Treatment may vary due to age or medical condition  Airway Management Planned: Simple Face Mask and Nasal Cannula  Additional Equipment: None  Intra-op Plan:   Post-operative Plan:   Informed Consent: I have reviewed the patients History and Physical, chart, labs and discussed the procedure including the risks, benefits and alternatives for the proposed anesthesia with the patient or authorized representative who has indicated his/her understanding and acceptance.       Plan Discussed with:   Anesthesia Plan Comments:          Anesthesia Quick Evaluation

## 2024-02-26 ENCOUNTER — Encounter (HOSPITAL_COMMUNITY): Payer: Self-pay | Admitting: Cardiology

## 2024-02-26 DIAGNOSIS — R531 Weakness: Secondary | ICD-10-CM | POA: Diagnosis not present

## 2024-02-26 DIAGNOSIS — B9562 Methicillin resistant Staphylococcus aureus infection as the cause of diseases classified elsewhere: Secondary | ICD-10-CM | POA: Diagnosis not present

## 2024-02-26 DIAGNOSIS — J181 Lobar pneumonia, unspecified organism: Secondary | ICD-10-CM | POA: Diagnosis not present

## 2024-02-26 DIAGNOSIS — E876 Hypokalemia: Secondary | ICD-10-CM

## 2024-02-26 DIAGNOSIS — R7881 Bacteremia: Secondary | ICD-10-CM | POA: Diagnosis not present

## 2024-02-26 DIAGNOSIS — L539 Erythematous condition, unspecified: Secondary | ICD-10-CM | POA: Diagnosis not present

## 2024-02-26 DIAGNOSIS — J189 Pneumonia, unspecified organism: Secondary | ICD-10-CM | POA: Diagnosis not present

## 2024-02-26 LAB — BASIC METABOLIC PANEL WITH GFR
Anion gap: 9 (ref 5–15)
BUN: 12 mg/dL (ref 8–23)
CO2: 25 mmol/L (ref 22–32)
Calcium: 8.3 mg/dL — ABNORMAL LOW (ref 8.9–10.3)
Chloride: 108 mmol/L (ref 98–111)
Creatinine, Ser: 0.58 mg/dL — ABNORMAL LOW (ref 0.61–1.24)
GFR, Estimated: >60 mL/min (ref >60)
Glucose, Bld: 72 mg/dL (ref 70–99)
Potassium: 3.9 mmol/L (ref 3.5–5.1)
Sodium: 142 mmol/L (ref 135–145)

## 2024-02-26 LAB — CBC
HCT: 31.8 % — ABNORMAL LOW (ref 39.0–52.0)
Hemoglobin: 10 g/dL — ABNORMAL LOW (ref 13.0–17.0)
MCH: 28.2 pg (ref 26.0–34.0)
MCHC: 31.4 g/dL (ref 30.0–36.0)
MCV: 89.8 fL (ref 80.0–100.0)
Platelets: 217 K/uL (ref 150–400)
RBC: 3.54 MIL/uL — ABNORMAL LOW (ref 4.22–5.81)
RDW: 17.3 % — ABNORMAL HIGH (ref 11.5–15.5)
WBC: 12.6 K/uL — ABNORMAL HIGH (ref 4.0–10.5)
nRBC: 0.2 % (ref 0.0–0.2)

## 2024-02-26 MED ORDER — POLYETHYLENE GLYCOL 3350 17 G PO PACK
17.0000 g | PACK | Freq: Two times a day (BID) | ORAL | Status: DC
  Administered 2024-02-26 – 2024-02-27 (×3): 17 g via ORAL
  Filled 2024-02-26 (×3): qty 1

## 2024-02-26 MED ORDER — POLYETHYLENE GLYCOL 3350 17 G PO PACK: 17.0000 g | PACK | Freq: Every day | ORAL | Status: DC

## 2024-02-26 MED ORDER — SENNA 8.6 MG PO TABS
1.0000 | ORAL_TABLET | Freq: Every day | ORAL | Status: DC | PRN
  Administered 2024-02-27: 8.6 mg via ORAL
  Filled 2024-02-26: qty 1

## 2024-02-26 NOTE — Progress Notes (Signed)
 [facility]  Date of Admission: 02/21/2024                                    Reason for Consult: MRSA Bacteremia  Antibiotics: Vancomycin : 9/27 - 9/28 Cefepime : 9/27 - 9/28 Metronidazole : 9/27 - 9/28 Daptomycin: 9/29 - Present Augmentin  9/30 - Present   Assessment and Plan: The patient is a 78 year old male with past medical history significant for atrial fibrillation on Eliquis , HFrEF, COPD, History of a left BKA, stage 1-2 sacral decubitus neuropathy and extensive hardware presence (knees, hips, left ankle, left shoulder, entire back) who was admitted due to a week of progressive weakness and a couple days of fever and chills. Work up this admission has shown that the patient has MRSA bacteremia.   MRSA Bacteremia Extensive Hardware to his knees, hips, left ankle, left shoulder and back  Pain and Induration to his Left Buttock COPD and he is on Prednisone  20mg  PO daily   - The patient initially received Vancomycin , Cefepime  and Metronidazole  due to concern for a possible pneumonia. Pneumonia seems unlikely so all antibiotics were stopped  - He is currently on Daptomycin for his MRSA bacteremia  - Echocardiogram was negative for any vegetations  - TEE was completed and was also negative for vegetations - Repeat blood cultures on 9/29 are NGTD  - The patient continues to deny any pain in his joints given his extensive hardware  - Recommend 4 weeks of IV Daptomycin 8mg /kg IV daily  - Will begin arranging outpatient antibiotic therapy however, acceptance to inpatient rehab is pending  - Maintain contact isolation precaution - For right shoulder future replacement, recommend not doing for at least 6 months beyond treatment of current MRSA bacteremia   - For his left buttock pain with erythema and induration, CT pelvis on 9/29 showed subcutaneous edema and induration - The patient was started on Augmentin  on 9/30 and that can be continued - Will continue  to keep an eye of this with duration of antibiotic to be determined pending improvement    Patient Active Problem List   Diagnosis Date Noted   MRSA bacteremia 02/22/2024   Left lower lobe pneumonia 02/21/2024   AKI (acute kidney injury) 02/21/2024   Hypokalemia 02/21/2024   Sacral wound, initial encounter 02/20/2024   Sepsis (HCC) 03/18/2023   Primary osteoarthritis, right shoulder 07/05/2022   BPH (benign prostatic hyperplasia) 02/21/2022   Acquired absence of right leg below knee (HCC) 01/22/2022   Peripheral neuropathy 02/22/2021   Chronic systolic heart failure (HCC) 02/22/2021   COPD (chronic obstructive pulmonary disease) (HCC) 03/21/2020   GERD (gastroesophageal reflux disease) status post Nissen fundoplication 12/03/2017   Hyperlipidemia    Paroxysmal atrial fibrillation (HCC) 05/11/2012   Asthma, moderate persistent 03/06/2009   Current Discharge Medication List     CONTINUE these medications which have NOT CHANGED   Details  albuterol  (VENTOLIN  HFA) 108 (90 Base) MCG/ACT inhaler INHALE 2 PUFFS BY MOUTH EVERY 4 HOURS AS NEEDED FOR WHEEZE OR FOR SHORTNESS OF BREATH Qty: 8 each, Refills: 4    apixaban  (ELIQUIS ) 5 MG TABS tablet Take 1 tablet (5 mg total) by mouth 2 (two) times daily. Qty: 180 tablet, Refills: 1   Associated Diagnoses: Paroxysmal atrial fibrillation (HCC)    atorvastatin  (LIPITOR) 20 MG tablet Take 1 tablet (20 mg total) by mouth daily. Qty: 90 tablet, Refills: 3  cyclobenzaprine  (FLEXERIL ) 10 MG tablet TAKE 1 TABLET BY MOUTH THREE TIMES A DAY AS NEEDED FOR MUSCLE SPASMS Qty: 90 tablet, Refills: 3    diltiazem  (CARDIZEM  CD) 180 MG 24 hr capsule Take 1 capsule (180 mg total) by mouth daily. Qty: 90 capsule, Refills: 3   Comments: Please d/c any RX on file for Diltiazem  - med change    diltiazem  (CARDIZEM ) 30 MG tablet Take 30 mg by mouth daily as needed (for high HR).    ferrous gluconate  (FERGON) 324 MG tablet Take 1 tablet (324 mg total) by  mouth daily. Qty: 30 tablet, Refills: 0    ipratropium-albuterol  (DUONEB) 0.5-2.5 (3) MG/3ML SOLN 1 NEBULE EVERY 6 HOURS AS NEEDED. **J45.3** Qty: 270 mL, Refills: 4    isosorbide  mononitrate (IMDUR ) 30 MG 24 hr tablet Take 1 tablet (30 mg total) by mouth daily. Qty: 90 tablet, Refills: 3    Olodaterol HCl (STRIVERDI RESPIMAT) 2.5 MCG/ACT AERS INHALE 2 PUFFS BY MOUTH INTO THE LUNGS DAILY Qty: 4 g, Refills: 11    pantoprazole  (PROTONIX ) 40 MG tablet Take 1 tablet (40 mg total) by mouth 2 (two) times daily before a meal. Qty: 60 tablet, Refills: 0   Associated Diagnoses: Gastroesophageal reflux disease without esophagitis    predniSONE  (DELTASONE ) 5 MG tablet 3 tabs daily Qty: 100 tablet, Refills: 5    pregabalin  (LYRICA ) 150 MG capsule Take 1 capsule (150 mg total) by mouth 2 (two) times daily. Qty: 60 capsule, Refills: 2   Associated Diagnoses: Neuropathy    tamsulosin  (FLOMAX ) 0.4 MG CAPS capsule Take 1 capsule (0.4 mg total) by mouth at bedtime. Qty: 30 capsule, Refills: 0       Subjective: The patient is lying in bed in no apparent distress. He denies any complaints at this time. He had a TEE yesterday without any issues. Patient and wife updated at bedside. They are waiting to hear if he has been accepted to inpatient rehab. He denies any complaints at this time other than some constipation. No acute events overnight   Review of Systems: Constitutional: Negative for fever, chills, fatigue or unexpected weight change. HENT: Negative for rhinorrhea and sore throat. Eyes: Negative for visual disturbance. Respiratory: Negative for cough and shortness of breath. Cardiovascular: Negative for chest pain and palpitations. Gastrointestinal: Negative for abdominal pain, nausea, vomiting and diarrhea. Genitourinary: Negative for dysuria and hematuria Musculoskeletal: Negative for arthralgias and joint swelling. Skin: Negative for rash.  Past Medical History:  Diagnosis Date    Agent orange exposure 1970's   takes Imdur ,Diltiazem , and Atacand  daily   Agent orange exposure    ALLERGIC RHINITIS    Allergy     seasonal allergies   Anemia    iron  deficiency   Arthritis    Asthma    uses inhaler and nebulizers   Atrial fibrillation (HCC)    Bruises easily    d/t prednisone  daily   Cataract    Cellulitis 03/22/2020   Chest wall pain 09/21/2019   L scapula, reported 4/27 21/     CHF (congestive heart failure) (HCC)    dx-on meds to treat   Chronic bronchitis (HCC)    used to get it q yr; last time ?2008 (04/28/2012)   Clotting disorder 2019   On blood thinner when I bleed it is for a consideral amount of time. This is tegardless of size of wound.   COPD (chronic obstructive pulmonary disease) (HCC)    agent orange exposure   Degenerative disk disease  everywhere (04/28/2012)   Depression due to physical illness 12/15/2023   Diverticulitis    Dysrhythmia    afib   Elevated uric acid in blood    takes Allopurinol  daily   Emphysema of lung (HCC)    Enlarged prostate    but not on any meds   Essential (primary) hypertension 11/15/2020   GERD (gastroesophageal reflux disease)    takes Protonix  daily   H/O hiatal hernia    Headache    History of colonic polyps 02/09/2013   01/2013 - 3 adenomas max 8 mm  01/2015 2 diminutive adenomas - repeat colon 2021     History of colonoscopy    History of pneumonia    a. 2010   History of total right hip arthroplasty 07/31/2015   Hyperlipidemia    takes Lipitor daily; pt. states he takes a preventive   Hypothyroidism    Joint pain    Joint swelling    Leukocytosis    Lumbar stenosis status post T12-S1 fusion 04/07/2017   MSSA bacteremia 03/19/2023   Nausea and vomiting 11/12/2022   Neuromuscular disorder (HCC)    bilat legs, and bilat arms   Neuropathy    Osteoarthritis of left knee 01/10/2012   Osteoporosis 2012   PAF (paroxysmal atrial fibrillation) (HCC)    a. Dx 04/2012, CHA2DS2VASc = 1 (age);  b.  04/2012 Echo: EF 40-50%, mild MR.   Personal history of colonic adenomas 02/09/2013   Pneumonia    Primary osteoarthritis of right knee 06/17/2014   Sepsis (HCC) 03/18/2023   Shortness of breath dyspnea    Thyroid  disease    no taking meds-caused dizziness   Social History   Tobacco Use   Smoking status: Never   Smokeless tobacco: Never  Vaping Use   Vaping status: Never Used  Substance Use Topics   Alcohol use: No   Drug use: No   Family History  Problem Relation Age of Onset   Diabetes Mother    Heart disease Father        Died 22, MI   Cancer Father    Hyperlipidemia Father    Stroke Father    Prostate cancer Brother    Colon polyps Brother    Diabetes Brother    Colon polyps Brother    Asthma Brother    COPD Brother    Colon cancer Brother        Dx age 39   Cancer Brother    Colon cancer Other    Esophageal cancer Neg Hx    Rectal cancer Neg Hx    Stomach cancer Neg Hx    Pancreatic cancer Neg Hx    Kidney disease Neg Hx    Liver disease Neg Hx    Neuropathy Neg Hx    Allergies  Allergen Reactions   Levaquin  [Levofloxacin ] Itching    Tolerated ciprofloxacin  in 2022.   Objective: Vitals:   02/26/24 0343 02/26/24 0745 02/26/24 0900 02/26/24 1136  BP: (!) 142/71 (!) 156/82  105/67  Pulse: (!) 29 99 (!) 110 (!) 107  Resp: 14   16  Temp: 98.1 F (36.7 C)  97.9 F (36.6 C) 98.1 F (36.7 C)  TempSrc: Oral  Oral Oral  SpO2: 92% 95% 96% 98%  Weight:      Height:       Body mass index is 24.39 kg/m.  General: Well developed, well nourished male in no apparent distress HENT: Moist mucous membranes, normal nose, normal external ears, and normocephalic.  Appeared flushed today from the neck up Neck: Supple, trachea midline, and normal cervical range of motion Eyes: PERRL, EOMI, non-icteric, and normal conjunctivae and lids Lungs: Clear to auscultation bilaterally. No wheezing, rales or rhonchi Cardiac: Regular rate and rhythm. No murmurs, rubs or  gallops. No peripheral edema Abdomen: Soft, Non distended, Non tender, active bowel sounds Skin: Intact, no focal erythema or rash, and warm and dry GU: Left buttock with induration and some edema noted  Musculoskeletal: No obvious skeletal abnormalities. Good range of motion with all extremities without associated pain Neuro: Alert, no focal neurologic deficits, moves all extremities Psych: Oriented x 3. Cooperative   Problem List Items Addressed This Visit       Respiratory   Left lower lobe pneumonia - Primary   Relevant Medications   guaiFENesin -dextromethorphan (ROBITUSSIN DM) 100-10 MG/5ML syrup 5 mL   ipratropium-albuterol  (DUONEB) 0.5-2.5 (3) MG/3ML nebulizer solution 3 mL   arformoterol  (BROVANA ) nebulizer solution 15 mcg   DAPTOmycin (CUBICIN) IVPB 700 mg/100mL premix   amoxicillin -clavulanate (AUGMENTIN ) 875-125 MG per tablet 1 tablet   mupirocin ointment (BACTROBAN) 2 % 1 Application     Other   Hypokalemia   Other Visit Diagnoses       Generalized weakness         Hypomagnesemia          Evalene CHRISTELLA Munch, MD [facility] 02/26/2024, 1:20 PM

## 2024-02-26 NOTE — PMR Pre-admission (Signed)
 PMR Admission Coordinator Pre-Admission Assessment  Patient: Levi [name] is an 78 y.o., male MRN: [id number] DOB: [birth year] Height: 6' 2 (188 cm) Weight: 86.2 kg  Insurance Information HMO:     PPO:      PCP:      IPA:      80/20:      OTHER:  PRIMARY: Medicare Part A and B      Policy#: [id number]      Subscriber: pt CM Name:       Phone#:      Fax#:  Pre-Cert#: verified Health and safety inspector:  Benefits:  Phone #:      Name:  Eff. Date: 03/27/12 A and B     Deduct: $1676      Out of Pocket Max: n/a      Life Max: n/a CIR: 100%      SNF: 20 full days Outpatient: 80%     Co-Pay: 20% Home Health: 100%      Co-Pay:  DME: 80%     Co-Pay: 20% Providers:  SECONDARY: BCBS      Policy#BETHA Rayas [id number]      Phone#: [phone]  Financial Counselor:       Phone#:   The "Data Collection Information Summary" for patients in Inpatient Rehabilitation Facilities with attached "Privacy Act Statement-Health Care Records" was provided and verbally reviewed with: Patient and Family  Emergency Contact Information Contact Information     Name Relation Home Work Mobile   [place] Spouse [phone]  [phone]   Delellis,Sally Daughter [phone]  [phone]      Other Contacts   None on File     Current Medical History  Patient Admitting Diagnosis: debility   History of Present Illness: Pt is a 78 y/o male with PMH of HTN, HLD, PAF, HFmrEF, depression, COPD, R BKA (no prosthetic yet), and peripheral neuropathy who was admitted to [facility] on 9/27 for c/o weakness, fever, and cough.  Temp with EMS was 100.4, SBP 90s, noted to have stage 1 sacral wound and skin tear to left thigh, non healing wound to medial aspect of R residual limb, and mild swelling of L hallux.  Recently completed abx course for URI symptoms.  In ED WBC 13.7, hgb 9.2 down from 10.8 on previous labs, potassium 3, mag 1.6, creatinine 1.1, calcium  8.2, albumin 2.1.  Viral panel negative.  Chest xray showed left  lower lobe consolidation concerning for PNA.  Blood cultures positive for MRSA and ID consulted.  Chest CT on 9/29 showed increasing atelectasis or infiltrate of the lung bases with trace right pleural effusion and small left pleural effusion.  ID recommended daptomyocin x4 weeks.  Added augmentin  on 9/30 for concern for abscess in left buttock.  TTE and TEE negative for vegetations.  Hospital course AKI, ongoing hypokalemia, wound care.  Therapy ongoing and recommendations are for CIR.     Patient's medical record from [name] has been reviewed by the rehabilitation admission coordinator and physician.  Past Medical History  Past Medical History:  Diagnosis Date   Agent orange exposure 1970's   takes Imdur ,Diltiazem , and Atacand  daily   Agent orange exposure    ALLERGIC RHINITIS    Allergy     seasonal allergies   Anemia    iron  deficiency   Arthritis    Asthma    uses inhaler and nebulizers   Atrial fibrillation (HCC)    Bruises easily    d/t prednisone  daily   Cataract  Cellulitis 03/22/2020   Chest wall pain 09/21/2019   L scapula, reported 4/27 21/     CHF (congestive heart failure) (HCC)    dx-on meds to treat   Chronic bronchitis (HCC)    used to get it q yr; last time ?2008 (04/28/2012)   Clotting disorder 2019   On blood thinner when I bleed it is for a consideral amount of time. This is tegardless of size of wound.   COPD (chronic obstructive pulmonary disease) (HCC)    agent orange exposure   Degenerative disk disease    everywhere (04/28/2012)   Depression due to physical illness 12/15/2023   Diverticulitis    Dysrhythmia    afib   Elevated uric acid in blood    takes Allopurinol  daily   Emphysema of lung (HCC)    Enlarged prostate    but not on any meds   Essential (primary) hypertension 11/15/2020   GERD (gastroesophageal reflux disease)    takes Protonix  daily   H/O hiatal hernia    Headache    History of colonic polyps 02/09/2013   01/2013 - 3  adenomas max 8 mm  01/2015 2 diminutive adenomas - repeat colon 2021     History of colonoscopy    History of pneumonia    a. 2010   History of total right hip arthroplasty 07/31/2015   Hyperlipidemia    takes Lipitor daily; pt. states he takes a preventive   Hypothyroidism    Joint pain    Joint swelling    Leukocytosis    Lumbar stenosis status post T12-S1 fusion 04/07/2017   MSSA bacteremia 03/19/2023   Nausea and vomiting 11/12/2022   Neuromuscular disorder (HCC)    bilat legs, and bilat arms   Neuropathy    Osteoarthritis of left knee 01/10/2012   Osteoporosis 2012   PAF (paroxysmal atrial fibrillation) (HCC)    a. Dx 04/2012, CHA2DS2VASc = 1 (age);  b. 04/2012 Echo: EF 40-50%, mild MR.   Personal history of colonic adenomas 02/09/2013   Pneumonia    Primary osteoarthritis of right knee 06/17/2014   Sepsis (HCC) 03/18/2023   Shortness of breath dyspnea    Thyroid  disease    no taking meds-caused dizziness    Has the patient had major surgery during 100 days prior to admission? No  Family History   family history includes Asthma in his brother; COPD in his brother; Cancer in his brother and father; Colon cancer in his brother and another family member; Colon polyps in his brother and brother; Diabetes in his brother and mother; Heart disease in his father; Hyperlipidemia in his father; Prostate cancer in his brother; Stroke in his father.  Current Medications  Current Facility-Administered Medications:    acetaminophen  (TYLENOL ) tablet 650 mg, 650 mg, Oral, Q6H PRN **OR** acetaminophen  (TYLENOL ) suppository 650 mg, 650 mg, Rectal, Q6H PRN, Jeffrie Oneil BROCKS, MD   amoxicillin -clavulanate (AUGMENTIN ) 875-125 MG per tablet 1 tablet, 1 tablet, Oral, Q12H, Jeffrie Oneil BROCKS, MD, 1 tablet at 02/26/24 9047   apixaban  (ELIQUIS ) tablet 5 mg, 5 mg, Oral, BID, Jeffrie Oneil BROCKS, MD, 5 mg at 02/26/24 9047   arformoterol  (BROVANA ) nebulizer solution 15 mcg, 15 mcg, Nebulization, BID, Jeffrie Oneil BROCKS, MD, 15 mcg at 02/26/24 9157   atorvastatin  (LIPITOR) tablet 20 mg, 20 mg, Oral, Daily, Jeffrie Oneil C, MD, 20 mg at 02/26/24 9047   Chlorhexidine  Gluconate Cloth 2 % PADS 6 each, 6 each, Topical, Daily, Jeffrie Oneil BROCKS, MD, 6 each  at 02/26/24 1105   DAPTOmycin (CUBICIN) IVPB 700 mg/100mL premix, 8 mg/kg, Intravenous, Q1400, Jeffrie Oneil BROCKS, MD, Last Rate: 200 mL/hr at 02/26/24 1300, 700 mg at 02/26/24 1300   diltiazem  (CARDIZEM  CD) 24 hr capsule 180 mg, 180 mg, Oral, Daily, Skains, Mark C, MD, 180 mg at 02/26/24 0950   ferrous gluconate  (FERGON) tablet 324 mg, 324 mg, Oral, Daily, Skains, Mark C, MD, 324 mg at 02/26/24 9048   guaiFENesin -dextromethorphan (ROBITUSSIN DM) 100-10 MG/5ML syrup 5 mL, 5 mL, Oral, Q4H PRN, Skains, Mark C, MD   ipratropium-albuterol  (DUONEB) 0.5-2.5 (3) MG/3ML nebulizer solution 3 mL, 3 mL, Nebulization, Q6H PRN, Jeffrie Oneil C, MD, 3 mL at 02/26/24 9157   LORazepam  (ATIVAN ) injection 0.5 mg, 0.5 mg, Intravenous, Q6H PRN, Jeffrie Oneil BROCKS, MD   melatonin tablet 5 mg, 5 mg, Oral, QHS, Skains, Mark C, MD, 5 mg at 02/25/24 2120   mupirocin ointment (BACTROBAN) 2 % 1 Application, 1 Application, Nasal, BID, Cindy Garnette POUR, MD, 1 Application at 02/26/24 [phone]   oxyCODONE  (Oxy IR/ROXICODONE ) immediate release tablet 5 mg, 5 mg, Oral, Q4H PRN, Jeffrie Oneil BROCKS, MD, 5 mg at 02/24/24 0434   pantoprazole  (PROTONIX ) EC tablet 40 mg, 40 mg, Oral, BID AC, Skains, Mark C, MD, 40 mg at 02/26/24 9047   polyethylene glycol (MIRALAX  / GLYCOLAX ) packet 17 g, 17 g, Oral, BID, Cindy Garnette POUR, MD, 17 g at 02/26/24 1259   predniSONE  (DELTASONE ) tablet 20 mg, 20 mg, Oral, Q breakfast, Skains, Mark C, MD, 20 mg at 02/26/24 9048   pregabalin  (LYRICA ) capsule 150 mg, 150 mg, Oral, Daily, Skains, Mark C, MD, 150 mg at 02/26/24 9048   pregabalin  (LYRICA ) capsule 75 mg, 75 mg, Oral, QPM, Skains, Mark C, MD, 75 mg at 02/25/24 1754   senna (SENOKOT) tablet 8.6 mg, 1 tablet, Oral, Daily PRN, Cindy Garnette POUR, MD   tamsulosin  (FLOMAX ) capsule 0.4 mg, 0.4 mg, Oral, QHS, Jeffrie Oneil BROCKS, MD, 0.4 mg at 02/25/24 2120  Patients Current Diet:  Diet Order             Diet regular Fluid consistency: Thin  Diet effective now                   Precautions / Restrictions Precautions Precautions: Fall Precaution/Restrictions Comments: R BKA Restrictions Weight Bearing Restrictions Per Provider Order: No Other Position/Activity Restrictions: chronic R BKA, does not have prosthetic yet but was supposed to pick up from [facility] this week   Has the patient had 2 or more falls or a fall with injury in the past year? No  Prior Activity Level Household: able to transfer to manual w/c with slideboard mod I, some minimal assist for ADLs prior to recent decline  Prior Functional Level Self Care: Did the patient need help bathing, dressing, using the toilet or eating? Needed some help  Indoor Mobility: Did the patient need assistance with walking from room to room (with or without device)? Dependent  Stairs: Did the patient need assistance with internal or external stairs (with or without device)? Dependent  Functional Cognition: Did the patient need help planning regular tasks such as shopping or remembering to take medications? Independent  Patient Information Are you of Hispanic, Latino/a,or Spanish origin?: A. No, not of Hispanic, Latino/a, or Spanish origin What is your race?: A. White Do you need or want an interpreter to communicate with a doctor or health care staff?: 0. No  Patient's Response To:  Health Literacy and  Transportation Is the patient able to respond to health literacy and transportation needs?: Yes Health Literacy - How often do you need to have someone help you when you read instructions, pamphlets, or other written material from your doctor or pharmacy?: Never In the past 12 months, has lack of transportation kept you from medical appointments or from getting  medications?: No In the past 12 months, has lack of transportation kept you from meetings, work, or from getting things needed for daily living?: No  Journalist, newspaper / Equipment Home Equipment: Shower seat, BSC/3in1, Agricultural consultant (2 wheels), [employer] - single point, Rollator (4 wheels), Wheelchair - manual, Toilet riser  Prior Device Use: Indicate devices/aids used by the patient prior to current illness, exacerbation or injury? Manual wheelchair and slideboard  Current Functional Level Cognition  Orientation Level: Oriented X4    Extremity Assessment (includes Sensation/Coordination)  Upper Extremity Assessment: Generalized weakness, RUE deficits/detail RUE Deficits / Details: hx R RTC tear ~15* shoulder flexion RUE Coordination: decreased gross motor  Lower Extremity Assessment: Defer to PT evaluation RLE Deficits / Details: chronic R BKA, ROM at knee and hip WFL    ADLs  Overall ADL's : Needs assistance/impaired Eating/Feeding: Set up, Sitting Grooming: Set up, Sitting Upper Body Bathing: Minimal assistance, Sitting Lower Body Bathing: Sitting/lateral leans, Moderate assistance Upper Body Dressing : Moderate assistance, Sitting Lower Body Dressing: Moderate assistance, Sitting/lateral leans Toilet Transfer: Maximal assistance Toileting- Clothing Manipulation and Hygiene: Total assistance Functional mobility during ADLs: Total assistance    Mobility  Overal bed mobility: Needs Assistance Bed Mobility: Supine to Sit, Sit to Supine Supine to sit: HOB elevated, Used rails, Mod assist Sit to supine: Mod assist General bed mobility comments: minA to manage LE, then modA to elevate trunk and use bed pad to scoot pt hips to EOB    Transfers  Overall transfer level: Needs assistance Equipment used: Rolling walker (2 wheels) Transfers: Bed to chair/wheelchair/BSC, Sit to/from Stand Sit to Stand: Mod assist, +2 physical assistance, From elevated surface Bed to/from  chair/wheelchair/BSC transfer type:: Lateral/scoot transfer  Lateral/Scoot Transfers: Contact guard assist General transfer comment: pt completed hip lift from EOB with BUE support and LLE x 5 at start of session with good clearance, then completed x6 sit-stand from EOB with modA of 2 and elevated bed. BUE support on RW and cues to maintain RW on ground, extend at hips, and maintain upright trunk, up to 15 sec standing tolerance    Ambulation / Gait / Stairs / Wheelchair Mobility  Ambulation/Gait General Gait Details: pt unable to advance LLE    Posture / Balance Dynamic Sitting Balance Sitting balance - Comments: right lateral lean Balance Overall balance assessment: Needs assistance Sitting-balance support: No upper extremity supported, Feet supported Sitting balance-[name] Scale: Poor Sitting balance - Comments: right lateral lean Postural control: Right lateral lean, Posterior lean Standing balance support: Bilateral upper extremity supported, During functional activity Standing balance-[name] Scale: Poor Standing balance comment: posterior lean despite modA and BUE support    Special considerations/life events  Continuous Drip IV  daptomycin , Skin stage 1 to sacrum, skin tears to left buttock, skin tear to left thigh/L shin/L lateral lower leg, and Diabetic management yes   Previous Home Environment (from acute therapy documentation) Living Arrangements: Spouse/significant other Available Help at Discharge: Family, Available 24 hours/day Type of Home: House Home Layout: Able to live on main level with bedroom/bathroom Home Access: Ramped entrance Bathroom Shower/Tub: Health visitor: Handicapped height Bathroom Accessibility: Yes Home  Care Services: Yes Type of Home Care Services: Home OT, Home PT, Home RN Home Care Agency (if known): [facility] Additional Comments: pt reports he was supposed to get a prosthetic for RLE next week  Discharge Living  Setting Plans for Discharge Living Setting: Patient's home, Lives with (comment) (spouse, [name]) Type of Home at Discharge: House Discharge Home Layout: Able to live on main level with bedroom/bathroom Discharge Home Access: Ramped entrance Discharge Bathroom Shower/Tub: Walk-in shower Discharge Bathroom Toilet: Handicapped height Discharge Bathroom Accessibility: Yes How Accessible: Accessible via wheelchair Does the patient have any problems obtaining your medications?: No  Social/Family/Support Systems Patient Roles: Spouse Anticipated Caregiver: [name] Anticipated Caregiver's Contact Information: [phone] Ability/Limitations of Caregiver: can provide some physical assist for transfers and ADLs if needed Caregiver Availability: 24/7 Discharge Plan Discussed with Primary Caregiver: Yes Is Caregiver In Agreement with Plan?: Yes  Goals Patient/Family Goal for Rehab: PT/OT min assist to mod I, SLP n/a Expected length of stay: 14-16 days Additional Information: Discharge plan: home with spouse who can provide 24/7 care support Pt/Family Agrees to Admission and willing to participate: Yes Program Orientation Provided & Reviewed with Pt/Caregiver Including Roles  & Responsibilities: Yes  Decrease burden of Care through IP rehab admission: n/a  Possible need for SNF placement upon discharge:  Not anticipated.  Plan for discharge home with family to provide 24/7 support  Patient Condition: I have reviewed medical records from [facility], spoken with [facility] team, and patient, spouse, and daughter. I met with patient at the bedside for inpatient rehabilitation assessment.  Patient will benefit from ongoing PT and OT, can actively participate in 3 hours of therapy a day 5 days of the week, and can make measurable gains during the admission.  Patient will also benefit from the coordinated team approach during an Inpatient Acute Rehabilitation admission.  The patient will receive intensive  therapy as well as Rehabilitation physician, nursing, social worker, and care management interventions.  Due to bladder management, bowel management, safety, skin/wound care, disease management, medication administration, pain management, and patient education the patient requires 24 hour a day rehabilitation nursing.  The patient is currently max assist with sit<>stand mobility and basic ADLs.  Discharge setting and therapy post discharge at home with home health is anticipated.  Patient has agreed to participate in the Acute Inpatient Rehabilitation Program and will admit today.  Preadmission Screen Completed By:  Reche FORBES Lowers, 02/26/2024 4:05 PM ______________________________________________________________________   Discussed status with Dr. Emeline on 02/27/24  at 10:31 AM  and received approval for admission today.  Admission Coordinator:  Caitlin E Warren, PT, time 10:31 AM Pattricia 02/27/24    Assessment/Plan: Diagnosis: Debility secondary to MRSA bacteremia d/t pneumonia Does the need for close, 24 hr/day Medical supervision in concert with the patient's rehab needs make it unreasonable for this patient to be served in a less intensive setting? Yes Co-Morbidities requiring supervision/potential complications: AKI, COPD, R rotator cuff injury, electrolyte deficiencies, CHF, A fib, anemia, pressure wounds, urinary retention / BPH, and peripheral neuropathy Due to bladder management, safety, skin/wound care, disease management, medication administration, pain management, and patient education, does the patient require 24 hr/day rehab nursing? Yes Does the patient require coordinated care of a physician, rehab nurse, PT, OT to address physical and functional deficits in the context of the above medical diagnosis(es)? Yes Addressing deficits in the following areas: balance, endurance, locomotion, strength, transferring, bathing, dressing, feeding, grooming, and toileting Can the patient actively  participate in an intensive therapy  program of at least 3 hrs of therapy 5 days a week? Yes The potential for patient to make measurable gains while on inpatient rehab is good Anticipated functional outcomes upon discharge from inpatient rehab: supervision PT, supervision OT Estimated rehab length of stay to reach the above functional goals is: 14-16 days Anticipated discharge destination: Home 10. Overall Rehab/Functional Prognosis: good   MD Signature:  Joesph JAYSON Likes, DO 02/27/2024

## 2024-02-26 NOTE — Progress Notes (Signed)
 Progress Note   Patient: Levi [name] FMW:[id number] DOB: [birth year] DOA: 02/21/2024     5 DOS: the patient was seen and examined on 02/26/2024   Brief hospital course: 78 y.o. male with medical history significant of hypertension, hyperlipidemia, GERD, paroxysmal A-fib on Eliquis , superficial vein thrombosis, iron  deficiency anemia, HFmrEF, depression, COPD, BPH, right foot osteomyelitis status post right BKA, stage I sacral pressure ulcer, peripheral neuropathy.  Last hospital admission 8/1-8//2025 for mild cellulitis of BKA stump and HCAP.  Patient presents to the ED today via EMS with complaints of generalized weakness, fever, and cough.  Temperature 100.4 F oral and he was given Tylenol  by EMS.  Also noted to have soft blood pressures in the range of 90s over 60s with EMS.  Patient endorsed some mild pain in the posterior aspect of his right BKA stump.  His stage I sacral wound did not appear infected.  Patient noted to have a noninfected skin tear to his left thigh, nonhealing wound to the medial aspect of his right stump, and mild swelling of his left great toe.  He was recently treated with antibiotics for URI symptoms.  Labs notable for WBC count 13.7, hemoglobin 9.2 (previously ranging between 10.8-12.2 on labs last month), MCV 89.5, potassium 3.0, magnesium  1.6, creatinine 1.1 (baseline 0.6-0.8), calcium  8.2, albumin 2.1, normal LFTs, COVID/influenza/RSV PCR negative, UA pending, blood cultures in process, lactic acid 2.7.  Chest x-ray showing left lower lobe consolidation concerning for pneumonia.  X-ray of left foot negative for acute fracture or evidence of osteomyelitis.  X-ray of right knee negative for signs of osteomyelitis at amputation stump site.  Patient was given oral potassium, azithromycin , cefepime , metronidazole , vancomycin , 2 L LR, and IV mag in the ED.  TRH called to admit.   Assessment and Plan: # Sepsis secondary to MRSA bacteremia Presenting with cough, fever, leukocytosis,  lactic acidosis, and soft blood pressures. Chest x-ray showing left lower lobe consolidation concerning for pneumonia.  COVID/influenza/RSV PCR negative.   - Lactate resolved  - Afebrile.  Leukocytosis resolved - BCx (9/27) positive for MRSA, final cultures pending - CT chest (9/29) showed increased patchy atelectasis or infiltrate at the lung bases with trace right pleural effusion and small left pleural effusion. - ID following - indicated low suspicion for CAP, recommended abx revision from Vanc and cefepime  to daptomycin. Added augmentin  on 9/30 due to concern for abscess formation at left buttocks - BCx (9/29) - NGTD - TTE showed LVEF 40-45%, moderate to severe MR, mild AS, and noted a linear structure on the aortic valve without clear independent movement - CT pelvis w/ contrast showed diffuse subcutaneous edema with induration of the soft tissues of the posterior medial left buttock with no drainable fluid collection or abscess and no soft tissue gas. - Had been continued on Daptomycin and Augmentin  - TEE performed 10/1, no endocarditis, no vegetations. ID has since recommended 4 weeks of IV daptomycin 8mg /kg daily   # Mild AKI - resolved - Likely prerenal in etiology in the setting of sepsis.  - renal function normalized.     # Hypokalemia  -Potassium replaced   # COPD - Stable, no signs of acute exacerbation.   - He is on chronic steroids, reportedly Prednisone  20 mg daily for many years.  The pulmonologist who initially prescribed this has retired. Patient has not yet establish care with a new pulmonologist.  - Patient was previously on PJP prophylaxis with Bactrim  from 03/2023 to 06/2023.  Patient's wife cannot recall  why it was stopped. - Continue prednisone  20 mg daily - Continue home inhalers.   # Hypertension - BP improved after fluid resuscitation - SBP ranging from 120s to 130s - Continue to hold home antihypertensives at this time   # Right rotator cuff injury  Right  shoulder pain - PRN analgesics with Tylenol  and oxycodone    # Hypomagnesemia - resolved   # Generalized weakness Patient has had a decline in strength and mobility, and requires more assistance that his wife is able to provide him at home on a regular basis. - PT/OT eval for rehab needs - recommending acute inpatient rehab - Was screened for CIR but was found not to meet medical necessity criteria -- other rehab options recommended - Fall precautions   # Chronic HFmrEF - TEE done in October 2024 showing EF 40 to 45%, mild mitral regurgitation, and trivial to mild aortic regurgitation.   - TTE (02/23/2024) showed LVEF 40 to 45%, moderate to severe MR, mild AS. - Undergoing TEE tentatively on 10/1 for evaluation of linear structure seen on TTE about the AV without clear independent movement - Clinically euvolemic at this time   # Paroxysmal A-fib - Currently in sinus rhythm - Continue Eliquis  - Continue home Cardizem    # Chronic iron  deficiency anemia - Hemoglobin 9.5, stable (previously ranging between 10.8-12.2 on labs last month).   - Patient is on chronic anticoagulation but not endorsing any symptoms of GI bleed or any other bleeding.   - Continue home iron  supplement.   # Stage I sacral pressure wound - Wound care.   # Hyperlipidemia - Continue Lipitor.   # GERD - Continue Protonix .   # BPH - Continue Flomax    # Peripheral neuropathy - Continue Lyrica       Subjective: Without complaints today  Physical Exam: Vitals:   02/26/24 0745 02/26/24 0900 02/26/24 1136 02/26/24 1607  BP: (!) 156/82  105/67 124/68  Pulse: 99 (!) 110 (!) 107 81  Resp:   16 18  Temp:  97.9 F (36.6 C) 98.1 F (36.7 C) 99.3 F (37.4 C)  TempSrc:  Oral Oral Oral  SpO2: 95% 96% 98% 96%  Weight:      Height:       General exam: Conversant, in no acute distress Respiratory system: normal chest rise, clear, no audible wheezing Cardiovascular system: regular rhythm,  s1-s2 Gastrointestinal system: Nondistended, nontender, pos BS Central nervous system: No seizures, no tremors Extremities: No cyanosis, no joint deformities Skin: No rashes, no pallor Psychiatry: Affect normal // no auditory hallucinations   Data Reviewed:  Labs reviewed: Na 142, K 3.9, Cr 0.58, WBC 12.6, Hgb 10.0, Plts 217  Family Communication: Pt in room, family at bedside  Disposition: Status is: Inpatient Remains inpatient appropriate because: severity of illness  Planned Discharge Destination: Rehab    Author: Garnette Pelt, MD 02/26/2024 5:00 PM  For on call review [email or web address].

## 2024-02-26 NOTE — Progress Notes (Signed)
 Inpatient Rehab Coordinator Note:  I met with patient and his s/o and daughter at bedside to discuss CIR recommendations and goals/expectations of CIR stay.  We reviewed 3 hrs/day of therapy, physician follow up, and average length of stay 2 weeks (dependent upon progress) with goals of supervision w/c level.  We reviewed medicare benefits.  All in agreement to pursue CIR.  I will review with MD in the morning.   Reche Lowers, PT, DPT Admissions Coordinator [phone] 02/26/24  2:56 PM

## 2024-02-26 NOTE — Progress Notes (Signed)
 Physical Therapy Treatment Patient Details Name: Levi [name] MRN: [id number] DOB: 08/21/1945 Today's Date: 02/26/2024   History of Present Illness 78 y.o. male presents to [facility] hospital on 02/21/2024 with weakness, fever, cough. Imaging concerning for LLL PNA, pt admitted for sepsis due to MRSA. PMH includes HTN, HLD, GERD, PAF, HFmrEF, depression, COPD, BPH, R BKA, peripheral neuropathy.    PT Comments  Pt is slower to progress toward goals, but starting to show improved strength and coordination within task, limited by poor R shoulder and tight extensors/quads LE's.  Emphasis on exercises and bil knee flexion stretch, both U and LE's bil., transition work to EOB with supine to sits and rolling to L vs R side, STS x5 into a RW and scoot transfers up toward [facility].     If plan is discharge home, recommend the following: A lot of help with walking and/or transfers;A lot of help with bathing/dressing/bathroom;Assistance with cooking/housework;Assist for transportation;Help with stairs or ramp for entrance   Can travel by private vehicle        Equipment Recommendations  Hoyer lift    Recommendations for Other Services Rehab consult     Precautions / Restrictions Precautions Precautions: Fall Recall of Precautions/Restrictions: Intact Precaution/Restrictions Comments: R BKA Restrictions Other Position/Activity Restrictions: chronic R BKA, does not have prosthetic yet but was supposed to pick up from [employer] this week     Mobility  Bed Mobility Overal bed mobility: Needs Assistance Bed Mobility: Rolling, Supine to Sit, Sit to Supine Rolling: Mod assist   Supine to sit: Mod assist, Max assist Sit to supine: Mod assist   General bed mobility comments: up/down via L elbow but without full rolling and heavy mod.  Worked on rolling from a hook-lying position with intent on building momentum, but did not add to full transition to EOB yet.    Transfers Overall transfer level: Needs  assistance Equipment used: Rolling walker (2 wheels) Transfers: Sit to/from Stand Sit to Stand: Mod assist, From elevated surface (x5)          Lateral/Scoot Transfers: Mod assist, Contact guard assist (mod initially to feel the movement and worked to [employer] for small scoot up 2 feet in bed.)      Ambulation/Gait                   Stairs             Wheelchair Mobility     Tilt Bed    Modified Rankin (Stroke Patients Only)       Balance     Sitting balance-[name] Scale: Fair       Standing balance-[name] Scale: Poor                              Communication Communication Communication: No apparent difficulties  Cognition Arousal: Alert Behavior During Therapy: WFL for tasks assessed/performed   PT - Cognitive impairments: No apparent impairments                       PT - Cognition Comments: not formally assessed, pt able to answer al questions appropriately and followed commands well in session Following commands: Intact      Cueing Cueing Techniques: Verbal cues  Exercises Amputee Exercises Hip Extension: AROM, Right, 10 reps, Supine Hip ABduction/ADduction: AROM, Right, 15 reps, Supine Hip Flexion/Marching: AROM, Right, 10 reps, Supine Knee Flexion: AROM, 10 reps, Right, Supine (in conjunction  with hip flexion) Other Exercises Other Exercises: bicep tricep presses, shoulders not isolated out bil x10 with graded resistance Other Exercises: bridging x5 Other Exercises: L hip/knee flextion ext with graded resistance x 10 reps.    General Comments        Pertinent Vitals/Pain Pain Assessment Faces Pain Scale: Hurts little more Pain Location: R shoulder Pain Descriptors / Indicators: Discomfort Pain Intervention(s): Monitored during session    Home Living                          Prior Function            PT Goals (current goals can now be found in the care plan section) Acute Rehab PT  Goals PT Goal Formulation: With patient Time For Goal Achievement: 03/07/24 Potential to Achieve Goals: Fair Progress towards PT goals: Progressing toward goals    Frequency    Min 2X/week      PT Plan      Co-evaluation              AM-PAC PT 6 Clicks Mobility   Outcome Measure  Help needed turning from your back to your side while in a flat bed without using bedrails?: A Little Help needed moving from lying on your back to sitting on the side of a flat bed without using bedrails?: A Lot Help needed moving to and from a bed to a chair (including a wheelchair)?: A Lot Help needed standing up from a chair using your arms (e.g., wheelchair or bedside chair)?: A Lot Help needed to walk in hospital room?: Total Help needed climbing 3-5 steps with a railing? : Total 6 Click Score: 11    End of Session Equipment Utilized During Treatment: Gait belt Activity Tolerance: Patient tolerated treatment well Patient left: in bed;with call bell/phone within reach;with bed alarm set;with family/visitor present Nurse Communication: Need for lift equipment;Mobility status PT Visit Diagnosis: Other abnormalities of gait and mobility (R26.89);Muscle weakness (generalized) (M62.81);History of falling (Z91.81)     Time: 8284-8245 PT Time Calculation (min) (ACUTE ONLY): 39 min  Charges:    $Therapeutic Exercise: 8-22 mins $Therapeutic Activity: 23-37 mins PT General Charges $$ ACUTE PT VISIT: 1 Visit                     02/26/2024  [place] HERO., PT Acute Rehabilitation Services [phone]  (office)   Vinie GAILS Ashtynn Berke 02/26/2024, 6:04 PM

## 2024-02-26 NOTE — Progress Notes (Signed)
 PHARMACY CONSULT NOTE FOR:  OUTPATIENT  PARENTERAL ANTIBIOTIC THERAPY (OPAT)  Indication: MRSA bacteremia in the setting with extensive hardware to knees, hips, left ankle, shoulder, and back  Regimen: Daptomycin IV 700 mg q24h  End date: 03/22/2024  IV antibiotic discharge orders are pended. To discharging provider:  please sign these orders via discharge navigator,  Select New Orders & click on the button choice - Manage This Unsigned Work.    Thank you for allowing pharmacy to be a part of this patient's care.  Feliciano Close, PharmD PGY2 Infectious Diseases Pharmacy Resident  02/26/2024 12:41 PM

## 2024-02-27 ENCOUNTER — Inpatient Hospital Stay (HOSPITAL_COMMUNITY)
Admission: AD | Admit: 2024-02-27 | Discharge: 2024-03-12 | DRG: 945 | Disposition: A | Discharge status: Home Health | Source: Intra-hospital | Attending: Physical Medicine & Rehabilitation | Admitting: Physical Medicine & Rehabilitation

## 2024-02-27 ENCOUNTER — Other Ambulatory Visit: Payer: Self-pay

## 2024-02-27 ENCOUNTER — Encounter (HOSPITAL_COMMUNITY): Payer: Self-pay | Admitting: Physical Medicine & Rehabilitation

## 2024-02-27 DIAGNOSIS — I951 Orthostatic hypotension: Secondary | ICD-10-CM | POA: Diagnosis not present

## 2024-02-27 DIAGNOSIS — Z79899 Other long term (current) drug therapy: Secondary | ICD-10-CM | POA: Diagnosis not present

## 2024-02-27 DIAGNOSIS — I5021 Acute systolic (congestive) heart failure: Secondary | ICD-10-CM

## 2024-02-27 DIAGNOSIS — Z881 Allergy status to other antibiotic agents status: Secondary | ICD-10-CM

## 2024-02-27 DIAGNOSIS — D689 Coagulation defect, unspecified: Secondary | ICD-10-CM | POA: Diagnosis present

## 2024-02-27 DIAGNOSIS — L03317 Cellulitis of buttock: Secondary | ICD-10-CM | POA: Diagnosis present

## 2024-02-27 DIAGNOSIS — Z8619 Personal history of other infectious and parasitic diseases: Secondary | ICD-10-CM

## 2024-02-27 DIAGNOSIS — I480 Paroxysmal atrial fibrillation: Secondary | ICD-10-CM | POA: Diagnosis present

## 2024-02-27 DIAGNOSIS — B9562 Methicillin resistant Staphylococcus aureus infection as the cause of diseases classified elsewhere: Secondary | ICD-10-CM | POA: Diagnosis not present

## 2024-02-27 DIAGNOSIS — Z8249 Family history of ischemic heart disease and other diseases of the circulatory system: Secondary | ICD-10-CM | POA: Diagnosis not present

## 2024-02-27 DIAGNOSIS — Z8701 Personal history of pneumonia (recurrent): Secondary | ICD-10-CM

## 2024-02-27 DIAGNOSIS — D649 Anemia, unspecified: Secondary | ICD-10-CM | POA: Diagnosis present

## 2024-02-27 DIAGNOSIS — Z83719 Family history of colon polyps, unspecified: Secondary | ICD-10-CM

## 2024-02-27 DIAGNOSIS — R531 Weakness: Secondary | ICD-10-CM | POA: Diagnosis not present

## 2024-02-27 DIAGNOSIS — E872 Acidosis, unspecified: Secondary | ICD-10-CM | POA: Diagnosis present

## 2024-02-27 DIAGNOSIS — R413 Other amnesia: Secondary | ICD-10-CM | POA: Diagnosis not present

## 2024-02-27 DIAGNOSIS — I5022 Chronic systolic (congestive) heart failure: Secondary | ICD-10-CM | POA: Diagnosis present

## 2024-02-27 DIAGNOSIS — E785 Hyperlipidemia, unspecified: Secondary | ICD-10-CM | POA: Diagnosis present

## 2024-02-27 DIAGNOSIS — J440 Chronic obstructive pulmonary disease with acute lower respiratory infection: Secondary | ICD-10-CM | POA: Diagnosis not present

## 2024-02-27 DIAGNOSIS — L89151 Pressure ulcer of sacral region, stage 1: Secondary | ICD-10-CM | POA: Diagnosis present

## 2024-02-27 DIAGNOSIS — X58XXXA Exposure to other specified factors, initial encounter: Secondary | ICD-10-CM | POA: Diagnosis present

## 2024-02-27 DIAGNOSIS — R230 Cyanosis: Secondary | ICD-10-CM | POA: Diagnosis present

## 2024-02-27 DIAGNOSIS — K219 Gastro-esophageal reflux disease without esophagitis: Secondary | ICD-10-CM | POA: Diagnosis present

## 2024-02-27 DIAGNOSIS — Z860100 Personal history of colon polyps, unspecified: Secondary | ICD-10-CM

## 2024-02-27 DIAGNOSIS — J439 Emphysema, unspecified: Secondary | ICD-10-CM | POA: Diagnosis present

## 2024-02-27 DIAGNOSIS — Z96612 Presence of left artificial shoulder joint: Secondary | ICD-10-CM | POA: Diagnosis present

## 2024-02-27 DIAGNOSIS — G629 Polyneuropathy, unspecified: Secondary | ICD-10-CM | POA: Diagnosis present

## 2024-02-27 DIAGNOSIS — E039 Hypothyroidism, unspecified: Secondary | ICD-10-CM | POA: Diagnosis present

## 2024-02-27 DIAGNOSIS — E876 Hypokalemia: Secondary | ICD-10-CM | POA: Diagnosis present

## 2024-02-27 DIAGNOSIS — R7989 Other specified abnormal findings of blood chemistry: Secondary | ICD-10-CM | POA: Diagnosis not present

## 2024-02-27 DIAGNOSIS — Z825 Family history of asthma and other chronic lower respiratory diseases: Secondary | ICD-10-CM

## 2024-02-27 DIAGNOSIS — M810 Age-related osteoporosis without current pathological fracture: Secondary | ICD-10-CM | POA: Diagnosis present

## 2024-02-27 DIAGNOSIS — R5381 Other malaise: Secondary | ICD-10-CM | POA: Diagnosis present

## 2024-02-27 DIAGNOSIS — Z8614 Personal history of Methicillin resistant Staphylococcus aureus infection: Secondary | ICD-10-CM

## 2024-02-27 DIAGNOSIS — N400 Enlarged prostate without lower urinary tract symptoms: Secondary | ICD-10-CM | POA: Diagnosis present

## 2024-02-27 DIAGNOSIS — J181 Lobar pneumonia, unspecified organism: Secondary | ICD-10-CM | POA: Diagnosis not present

## 2024-02-27 DIAGNOSIS — N179 Acute kidney failure, unspecified: Secondary | ICD-10-CM | POA: Diagnosis present

## 2024-02-27 DIAGNOSIS — M19011 Primary osteoarthritis, right shoulder: Secondary | ICD-10-CM | POA: Diagnosis present

## 2024-02-27 DIAGNOSIS — Z89511 Acquired absence of right leg below knee: Secondary | ICD-10-CM | POA: Diagnosis not present

## 2024-02-27 DIAGNOSIS — Z87820 Personal history of traumatic brain injury: Secondary | ICD-10-CM

## 2024-02-27 DIAGNOSIS — Z96653 Presence of artificial knee joint, bilateral: Secondary | ICD-10-CM | POA: Diagnosis present

## 2024-02-27 DIAGNOSIS — D72829 Elevated white blood cell count, unspecified: Secondary | ICD-10-CM

## 2024-02-27 DIAGNOSIS — E440 Moderate protein-calorie malnutrition: Secondary | ICD-10-CM | POA: Diagnosis present

## 2024-02-27 DIAGNOSIS — Z833 Family history of diabetes mellitus: Secondary | ICD-10-CM

## 2024-02-27 DIAGNOSIS — F39 Unspecified mood [affective] disorder: Secondary | ICD-10-CM | POA: Diagnosis not present

## 2024-02-27 DIAGNOSIS — Z9049 Acquired absence of other specified parts of digestive tract: Secondary | ICD-10-CM

## 2024-02-27 DIAGNOSIS — J189 Pneumonia, unspecified organism: Secondary | ICD-10-CM | POA: Diagnosis not present

## 2024-02-27 DIAGNOSIS — L539 Erythematous condition, unspecified: Secondary | ICD-10-CM | POA: Diagnosis not present

## 2024-02-27 DIAGNOSIS — Z6820 Body mass index (BMI) 20.0-20.9, adult: Secondary | ICD-10-CM

## 2024-02-27 DIAGNOSIS — G8929 Other chronic pain: Secondary | ICD-10-CM | POA: Diagnosis present

## 2024-02-27 DIAGNOSIS — K5900 Constipation, unspecified: Secondary | ICD-10-CM | POA: Diagnosis not present

## 2024-02-27 DIAGNOSIS — Z7901 Long term (current) use of anticoagulants: Secondary | ICD-10-CM | POA: Diagnosis not present

## 2024-02-27 DIAGNOSIS — R7881 Bacteremia: Secondary | ICD-10-CM | POA: Diagnosis not present

## 2024-02-27 DIAGNOSIS — Z96643 Presence of artificial hip joint, bilateral: Secondary | ICD-10-CM | POA: Diagnosis present

## 2024-02-27 DIAGNOSIS — K5901 Slow transit constipation: Secondary | ICD-10-CM | POA: Diagnosis not present

## 2024-02-27 DIAGNOSIS — I110 Hypertensive heart disease with heart failure: Secondary | ICD-10-CM | POA: Diagnosis present

## 2024-02-27 DIAGNOSIS — M25511 Pain in right shoulder: Secondary | ICD-10-CM | POA: Diagnosis not present

## 2024-02-27 DIAGNOSIS — Z9181 History of falling: Secondary | ICD-10-CM

## 2024-02-27 DIAGNOSIS — Z7952 Long term (current) use of systemic steroids: Secondary | ICD-10-CM

## 2024-02-27 DIAGNOSIS — M75101 Unspecified rotator cuff tear or rupture of right shoulder, not specified as traumatic: Secondary | ICD-10-CM | POA: Diagnosis present

## 2024-02-27 DIAGNOSIS — Z83438 Family history of other disorder of lipoprotein metabolism and other lipidemia: Secondary | ICD-10-CM

## 2024-02-27 DIAGNOSIS — M1990 Unspecified osteoarthritis, unspecified site: Secondary | ICD-10-CM | POA: Diagnosis present

## 2024-02-27 DIAGNOSIS — J449 Chronic obstructive pulmonary disease, unspecified: Secondary | ICD-10-CM | POA: Diagnosis present

## 2024-02-27 DIAGNOSIS — R4189 Other symptoms and signs involving cognitive functions and awareness: Secondary | ICD-10-CM | POA: Diagnosis not present

## 2024-02-27 LAB — CBC
HCT: 31 % — ABNORMAL LOW (ref 39.0–52.0)
Hemoglobin: 10 g/dL — ABNORMAL LOW (ref 13.0–17.0)
MCH: 28.5 pg (ref 26.0–34.0)
MCHC: 32.3 g/dL (ref 30.0–36.0)
MCV: 88.3 fL (ref 80.0–100.0)
Platelets: 225 K/uL (ref 150–400)
RBC: 3.51 MIL/uL — ABNORMAL LOW (ref 4.22–5.81)
RDW: 17.4 % — ABNORMAL HIGH (ref 11.5–15.5)
WBC: 11.8 K/uL — ABNORMAL HIGH (ref 4.0–10.5)
nRBC: 0 % (ref 0.0–0.2)

## 2024-02-27 LAB — GLUCOSE, CAPILLARY
Glucose-Capillary: 125 mg/dL — ABNORMAL HIGH (ref 70–99)
Glucose-Capillary: 93 mg/dL (ref 70–99)

## 2024-02-27 LAB — BASIC METABOLIC PANEL WITH GFR
Anion gap: 10 (ref 5–15)
BUN: 13 mg/dL (ref 8–23)
CO2: 26 mmol/L (ref 22–32)
Calcium: 8.6 mg/dL — ABNORMAL LOW (ref 8.9–10.3)
Chloride: 103 mmol/L (ref 98–111)
Creatinine, Ser: 0.61 mg/dL (ref 0.61–1.24)
GFR, Estimated: >60 mL/min (ref >60)
Glucose, Bld: 89 mg/dL (ref 70–99)
Potassium: 3.5 mmol/L (ref 3.5–5.1)
Sodium: 139 mmol/L (ref 135–145)

## 2024-02-27 MED ORDER — MUPIROCIN 2 % EX OINT
1.0000 | TOPICAL_OINTMENT | Freq: Two times a day (BID) | CUTANEOUS | Status: AC
  Administered 2024-02-27 – 2024-02-29 (×5): 1 via NASAL
  Filled 2024-02-27: qty 22

## 2024-02-27 MED ORDER — PREGABALIN 75 MG PO CAPS
75.0000 mg | ORAL_CAPSULE | Freq: Every evening | ORAL | Status: DC
  Administered 2024-02-27 – 2024-03-11 (×14): 75 mg via ORAL
  Filled 2024-02-27 (×2): qty 1
  Filled 2024-02-27: qty 3
  Filled 2024-02-27 (×11): qty 1

## 2024-02-27 MED ORDER — ARFORMOTEROL TARTRATE 15 MCG/2ML IN NEBU
15.0000 ug | INHALATION_SOLUTION | Freq: Two times a day (BID) | RESPIRATORY_TRACT | Status: AC
Start: 2024-02-27 — End: ?
  Administered 2024-02-27 – 2024-03-12 (×21): 15 ug via RESPIRATORY_TRACT
  Filled 2024-02-27 (×28): qty 2

## 2024-02-27 MED ORDER — PREDNISONE 20 MG PO TABS
20.0000 mg | ORAL_TABLET | Freq: Every day | ORAL | Status: DC
  Administered 2024-02-28 – 2024-03-12 (×14): 20 mg via ORAL
  Filled 2024-02-27 (×14): qty 1

## 2024-02-27 MED ORDER — AMOXICILLIN-POT CLAVULANATE 875-125 MG PO TABS: 1.0000 | ORAL_TABLET | Freq: Two times a day (BID) | ORAL | Status: DC

## 2024-02-27 MED ORDER — ACETAMINOPHEN 325 MG PO TABS
325.0000 mg | ORAL_TABLET | ORAL | Status: DC | PRN
  Administered 2024-03-08: 650 mg via ORAL
  Filled 2024-02-27: qty 2

## 2024-02-27 MED ORDER — PANTOPRAZOLE SODIUM 40 MG PO TBEC
40.0000 mg | DELAYED_RELEASE_TABLET | Freq: Two times a day (BID) | ORAL | Status: DC
  Administered 2024-02-27 – 2024-03-12 (×28): 40 mg via ORAL
  Filled 2024-02-27 (×28): qty 1

## 2024-02-27 MED ORDER — TAMSULOSIN HCL 0.4 MG PO CAPS
0.4000 mg | ORAL_CAPSULE | Freq: Every day | ORAL | Status: DC
  Administered 2024-02-27 – 2024-03-11 (×14): 0.4 mg via ORAL
  Filled 2024-02-27 (×14): qty 1

## 2024-02-27 MED ORDER — OXYCODONE HCL 5 MG PO TABS
5.0000 mg | ORAL_TABLET | ORAL | Status: DC | PRN
  Administered 2024-02-28 – 2024-03-11 (×11): 5 mg via ORAL
  Filled 2024-02-27 (×12): qty 1

## 2024-02-27 MED ORDER — MELATONIN 5 MG PO TABS
5.0000 mg | ORAL_TABLET | Freq: Every day | ORAL | Status: DC
  Administered 2024-02-27 – 2024-03-11 (×14): 5 mg via ORAL
  Filled 2024-02-27 (×14): qty 1

## 2024-02-27 MED ORDER — HYDROCERIN EX CREA
TOPICAL_CREAM | Freq: Two times a day (BID) | CUTANEOUS | Status: DC
  Filled 2024-02-27: qty 113

## 2024-02-27 MED ORDER — POLYETHYLENE GLYCOL 3350 17 G PO PACK
17.0000 g | PACK | Freq: Two times a day (BID) | ORAL | Status: AC
Start: 2024-02-27 — End: ?
  Administered 2024-02-27 – 2024-03-11 (×14): 17 g via ORAL
  Filled 2024-02-27 (×27): qty 1

## 2024-02-27 MED ORDER — DIPHENHYDRAMINE HCL 25 MG PO CAPS: 25.0000 mg | ORAL_CAPSULE | Freq: Four times a day (QID) | ORAL | Status: DC | PRN

## 2024-02-27 MED ORDER — DAPTOMYCIN-SODIUM CHLORIDE 700-0.9 MG/100ML-% IV SOLN: 700.0000 mg | Freq: Every day | INTRAVENOUS | Status: DC

## 2024-02-27 MED ORDER — LIDOCAINE HCL URETHRAL/MUCOSAL 2 % EX GEL
CUTANEOUS | Status: AC | PRN
Start: 2024-02-27 — End: ?

## 2024-02-27 MED ORDER — TRAZODONE HCL 50 MG PO TABS
25.0000 mg | ORAL_TABLET | Freq: Every evening | ORAL | Status: DC | PRN
  Administered 2024-02-27: 50 mg via ORAL
  Filled 2024-02-27: qty 1

## 2024-02-27 MED ORDER — MAGNESIUM HYDROXIDE 400 MG/5ML PO SUSP
45.0000 mL | Freq: Once | ORAL | Status: AC
  Administered 2024-02-27: 45 mL via ORAL
  Filled 2024-02-27: qty 60

## 2024-02-27 MED ORDER — PROCHLORPERAZINE MALEATE 5 MG PO TABS: 5.0000 mg | ORAL_TABLET | Freq: Four times a day (QID) | ORAL | Status: DC | PRN

## 2024-02-27 MED ORDER — OXYCODONE HCL 5 MG PO TABS: 5.0000 mg | ORAL_TABLET | ORAL | Status: DC | PRN

## 2024-02-27 MED ORDER — SODIUM CHLORIDE 0.9% FLUSH: 10.0000 mL | INTRAVENOUS | Status: DC | PRN

## 2024-02-27 MED ORDER — FERROUS GLUCONATE 324 (38 FE) MG PO TABS
324.0000 mg | ORAL_TABLET | Freq: Every day | ORAL | Status: DC
  Administered 2024-02-28 – 2024-03-12 (×14): 324 mg via ORAL
  Filled 2024-02-27 (×14): qty 1

## 2024-02-27 MED ORDER — DAPTOMYCIN-SODIUM CHLORIDE 700-0.9 MG/100ML-% IV SOLN
8.0000 mg/kg | Freq: Every day | INTRAVENOUS | Status: AC
Start: 2024-02-28 — End: ?
  Administered 2024-02-28 – 2024-03-12 (×14): 700 mg via INTRAVENOUS
  Filled 2024-02-27 (×14): qty 100

## 2024-02-27 MED ORDER — SODIUM CHLORIDE 0.9% FLUSH
10.0000 mL | Freq: Two times a day (BID) | INTRAVENOUS | Status: DC
  Administered 2024-02-27 – 2024-03-05 (×14): 10 mL
  Administered 2024-03-05: 20 mL
  Administered 2024-03-06 – 2024-03-12 (×10): 10 mL

## 2024-02-27 MED ORDER — PREGABALIN 75 MG PO CAPS
150.0000 mg | ORAL_CAPSULE | Freq: Every day | ORAL | Status: DC
  Administered 2024-02-28 – 2024-03-12 (×14): 150 mg via ORAL
  Filled 2024-02-27 (×8): qty 2
  Filled 2024-02-27: qty 6
  Filled 2024-02-27 (×5): qty 2

## 2024-02-27 MED ORDER — SODIUM CHLORIDE 0.9% FLUSH: 10.0000 mL | Freq: Two times a day (BID) | INTRAVENOUS | Status: DC

## 2024-02-27 MED ORDER — PROCHLORPERAZINE EDISYLATE 10 MG/2ML IJ SOLN: 5.0000 mg | Freq: Four times a day (QID) | INTRAMUSCULAR | Status: DC | PRN

## 2024-02-27 MED ORDER — BISACODYL 10 MG RE SUPP: 10.0000 mg | Freq: Every day | RECTAL | Status: DC | PRN

## 2024-02-27 MED ORDER — PROCHLORPERAZINE 25 MG RE SUPP: 12.5000 mg | Freq: Four times a day (QID) | RECTAL | Status: DC | PRN

## 2024-02-27 MED ORDER — AMOXICILLIN-POT CLAVULANATE 875-125 MG PO TABS
1.0000 | ORAL_TABLET | Freq: Two times a day (BID) | ORAL | Status: DC
  Administered 2024-02-27 – 2024-03-07 (×19): 1 via ORAL
  Filled 2024-02-27 (×19): qty 1

## 2024-02-27 MED ORDER — IPRATROPIUM-ALBUTEROL 0.5-2.5 (3) MG/3ML IN SOLN: 3.0000 mL | Freq: Four times a day (QID) | RESPIRATORY_TRACT | Status: DC | PRN

## 2024-02-27 MED ORDER — ALUM & MAG HYDROXIDE-SIMETH 200-200-20 MG/5ML PO SUSP: 30.0000 mL | ORAL | Status: DC | PRN

## 2024-02-27 MED ORDER — APIXABAN 5 MG PO TABS
5.0000 mg | ORAL_TABLET | Freq: Two times a day (BID) | ORAL | Status: DC
  Administered 2024-02-27 – 2024-03-12 (×28): 5 mg via ORAL
  Filled 2024-02-27 (×29): qty 1

## 2024-02-27 MED ORDER — ATORVASTATIN CALCIUM 10 MG PO TABS
20.0000 mg | ORAL_TABLET | Freq: Every day | ORAL | Status: DC
  Administered 2024-02-28 – 2024-03-12 (×14): 20 mg via ORAL
  Filled 2024-02-27 (×14): qty 2

## 2024-02-27 MED ORDER — POLYETHYLENE GLYCOL 3350 17 G PO PACK: 17.0000 g | PACK | Freq: Two times a day (BID) | ORAL | Status: DC

## 2024-02-27 MED ORDER — CHLORHEXIDINE GLUCONATE CLOTH 2 % EX PADS
6.0000 | MEDICATED_PAD | Freq: Every day | CUTANEOUS | Status: DC
Start: 2024-02-28 — End: 2024-02-28
  Administered 2024-02-28: 6 via TOPICAL

## 2024-02-27 MED ORDER — DILTIAZEM HCL ER COATED BEADS 180 MG PO CP24
180.0000 mg | ORAL_CAPSULE | Freq: Every day | ORAL | Status: DC
  Administered 2024-02-28 – 2024-03-12 (×14): 180 mg via ORAL
  Filled 2024-02-27 (×14): qty 1

## 2024-02-27 MED ORDER — GUAIFENESIN-DM 100-10 MG/5ML PO SYRP: 5.0000 mL | ORAL_SOLUTION | Freq: Four times a day (QID) | ORAL | Status: DC | PRN

## 2024-02-27 MED ORDER — FLEET ENEMA RE ENEM: 1.0000 | ENEMA | Freq: Once | RECTAL | Status: DC | PRN

## 2024-02-27 MED ORDER — SENNA 8.6 MG PO TABS
2.0000 | ORAL_TABLET | Freq: Every day | ORAL | Status: DC
  Administered 2024-02-27 – 2024-03-12 (×14): 17.2 mg via ORAL
  Filled 2024-02-27 (×15): qty 2

## 2024-02-27 NOTE — Progress Notes (Addendum)
 Physical Therapy Treatment Patient Details Name: Levi [name] MRN: [id number] DOB: Jul 06, 1945 Today's Date: 02/27/2024   History of Present Illness 78 y.o. male presents to [facility] hospital on 02/21/2024 with weakness, fever, cough. Imaging concerning for LLL PNA, pt admitted for sepsis due to MRSA. PMH includes HTN, HLD, GERD, PAF, HFmrEF, depression, COPD, BPH, R BKA, peripheral neuropathy.    PT Comments  Progressing well, limited by R rotator cuff injury and R BKA.  Pt is ready for AIR level of activity.     If plan is discharge home, recommend the following: A lot of help with walking and/or transfers;A lot of help with bathing/dressing/bathroom;Assistance with cooking/housework;Assist for transportation;Help with stairs or ramp for entrance   Can travel by private vehicle        Equipment Recommendations  Other (comment) (TBD)    Recommendations for Other Services Rehab consult     Precautions / Restrictions Precautions Precautions: Fall Recall of Precautions/Restrictions: Intact Precaution/Restrictions Comments: R BKA Restrictions Other Position/Activity Restrictions: chronic R BKA, does not have prosthetic yet but was supposed to pick up from [employer] this week     Mobility  Bed Mobility Overal bed mobility: Needs Assistance Bed Mobility: Rolling, Sidelying to Sit, Sit to Supine Rolling: Mod assist Sidelying to sit: Mod assist   Sit to supine: Mod assist        Transfers Overall transfer level: Needs assistance Equipment used: Rolling walker (2 wheels) Transfers: Sit to/from Stand Sit to Stand: Mod assist, From elevated surface, +2 physical assistance, +2 safety/equipment (x3 limited due to pt had a STAT PICC line placement)                Ambulation/Gait                   Stairs             Wheelchair Mobility     Tilt Bed    Modified Rankin (Stroke Patients Only)       Balance Overall balance assessment: Needs assistance    Sitting balance-[name] Scale: Fair       Standing balance-[name] Scale: Poor Standing balance comment: reliant on AD and external support                            Communication Communication Communication: No apparent difficulties  Cognition Arousal: Alert Behavior During Therapy: WFL for tasks assessed/performed   PT - Cognitive impairments: No apparent impairments                         Following commands: Intact      Cueing Cueing Techniques: Verbal cues  Exercises Amputee Exercises Hip Flexion/Marching: AROM, Both, 5 reps, Supine (knee flexion with hip flexion with assisted knee flexion to approx 90 degrees.)    General Comments General comments (skin integrity, edema, etc.): vss      Pertinent Vitals/Pain Pain Assessment Pain Assessment: Faces Faces Pain Scale: Hurts a little bit Pain Location: R shoulder Pain Descriptors / Indicators: Discomfort Pain Intervention(s): Monitored during session, Limited activity within patient's tolerance    Home Living                          Prior Function            PT Goals (current goals can now be found in the care plan section) Acute Rehab PT Goals  PT Goal Formulation: With patient Time For Goal Achievement: 03/07/24 Potential to Achieve Goals: Fair Progress towards PT goals: Progressing toward goals    Frequency    Min 2X/week      PT Plan      Co-evaluation              AM-PAC PT 6 Clicks Mobility   Outcome Measure  Help needed turning from your back to your side while in a flat bed without using bedrails?: A Little Help needed moving from lying on your back to sitting on the side of a flat bed without using bedrails?: A Lot Help needed moving to and from a bed to a chair (including a wheelchair)?: A Lot Help needed standing up from a chair using your arms (e.g., wheelchair or bedside chair)?: A Lot Help needed to walk in hospital room?: Total Help needed  climbing 3-5 steps with a railing? : Total 6 Click Score: 11    End of Session Equipment Utilized During Treatment: Gait belt Activity Tolerance: Patient tolerated treatment well Patient left: in bed;with call bell/phone within reach;with bed alarm set;with family/visitor present Nurse Communication: Mobility status PT Visit Diagnosis: Other abnormalities of gait and mobility (R26.89);Muscle weakness (generalized) (M62.81);History of falling (Z91.81)     Time: 8844-8785 PT Time Calculation (min) (ACUTE ONLY): 19 min  Charges:    $Therapeutic Activity: 8-22 mins PT General Charges $$ ACUTE PT VISIT: 1 Visit                     02/27/2024  India HERO., PT Acute Rehabilitation Services [phone]  (office)   Vinie GAILS Willy Vorce 02/27/2024, 12:40 PM

## 2024-02-27 NOTE — Progress Notes (Signed)
 Inpatient Rehab Admissions Coordinator:   I have a bed for this patient to admit today.  Dr. Cindy to assess and confirm ready.  TOC aware.  I will let pt/family know and make arrangements.   Reche Lowers, PT, DPT Admissions Coordinator [phone] 02/27/24  10:21 AM

## 2024-02-27 NOTE — Discharge Summary (Signed)
 Physician Discharge Summary   Patient: Levi [name] MRN: [id number] DOB: 19-Aug-1945  Admit date:     02/21/2024  Discharge date: 02/27/24  Discharge Physician: Garnette Pelt   PCP: Jerrell Cleatus Ned, MD   Recommendations at discharge:    Follow up with PCP when discharged Recommend inpt ID consult if questions with antimicrobials  Discharge Diagnoses: Principal Problem:   MRSA bacteremia Active Problems:   Paroxysmal atrial fibrillation (HCC)   Hyperlipidemia   GERD (gastroesophageal reflux disease) status post Nissen fundoplication   COPD (chronic obstructive pulmonary disease) (HCC)   BPH (benign prostatic hyperplasia)   Sepsis (HCC)   Left lower lobe pneumonia   AKI (acute kidney injury)   Hypokalemia  Resolved Problems:   * No resolved hospital problems. *  Hospital Course: 78 y.o. male with medical history significant of hypertension, hyperlipidemia, GERD, paroxysmal A-fib on Eliquis , superficial vein thrombosis, iron  deficiency anemia, HFmrEF, depression, COPD, BPH, right foot osteomyelitis status post right BKA, stage I sacral pressure ulcer, peripheral neuropathy.  Last hospital admission 8/1-8//2025 for mild cellulitis of BKA stump and HCAP.  Patient presents to the ED today via EMS with complaints of generalized weakness, fever, and cough.  Temperature 100.4 F oral and he was given Tylenol  by EMS.  Also noted to have soft blood pressures in the range of 90s over 60s with EMS.  Patient endorsed some mild pain in the posterior aspect of his right BKA stump.  His stage I sacral wound did not appear infected.  Patient noted to have a noninfected skin tear to his left thigh, nonhealing wound to the medial aspect of his right stump, and mild swelling of his left great toe.  He was recently treated with antibiotics for URI symptoms.  Labs notable for WBC count 13.7, hemoglobin 9.2 (previously ranging between 10.8-12.2 on labs last month), MCV 89.5, potassium 3.0, magnesium   1.6, creatinine 1.1 (baseline 0.6-0.8), calcium  8.2, albumin 2.1, normal LFTs, COVID/influenza/RSV PCR negative, UA pending, blood cultures in process, lactic acid 2.7.  Chest x-ray showing left lower lobe consolidation concerning for pneumonia.  X-ray of left foot negative for acute fracture or evidence of osteomyelitis.  X-ray of right knee negative for signs of osteomyelitis at amputation stump site.  Patient was given oral potassium, azithromycin , cefepime , metronidazole , vancomycin , 2 L LR, and IV mag in the ED.  TRH called to admit.   Assessment and Plan: # Sepsis secondary to MRSA bacteremia Presenting with cough, fever, leukocytosis, lactic acidosis, and soft blood pressures. Chest x-ray showing left lower lobe consolidation concerning for pneumonia.  COVID/influenza/RSV PCR negative.   - Lactate resolved  - Afebrile.  Leukocytosis resolved - BCx (9/27) positive for MRSA, final cultures pending - CT chest (9/29) showed increased patchy atelectasis or infiltrate at the lung bases with trace right pleural effusion and small left pleural effusion. - ID following - indicated low suspicion for CAP, recommended abx revision from Vanc and cefepime  to daptomycin. Added augmentin  on 9/30 due to concern for abscess formation at left buttocks - BCx (9/29) - NGTD - TTE showed LVEF 40-45%, moderate to severe MR, mild AS, and noted a linear structure on the aortic valve without clear independent movement - CT pelvis w/ contrast showed diffuse subcutaneous edema with induration of the soft tissues of the posterior medial left buttock with no drainable fluid collection or abscess and no soft tissue gas. - Had been continued on Daptomycin and Augmentin  - TEE performed 10/1, no endocarditis, no vegetations. ID has since  recommended 4 weeks of IV daptomycin 8mg /kg daily, stop date 03/22/24. ID has recommended total 10 days of augmentin , which may be extended pending pt's progress   # Mild AKI - resolved -  Likely prerenal in etiology in the setting of sepsis.  - renal function normalized.     # Hypokalemia  -Potassium replaced   # COPD - Stable, no signs of acute exacerbation.   - He is on chronic steroids, reportedly Prednisone  20 mg daily for many years.  The pulmonologist who initially prescribed this has retired. Patient has not yet establish care with a new pulmonologist.  - Patient was previously on PJP prophylaxis with Bactrim  from 03/2023 to 06/2023.  Patient's wife cannot recall why it was stopped. - Continue prednisone  20 mg daily - Continue home inhalers.   # Hypertension - BP improved after fluid resuscitation - SBP ranging from 120s to 130s - Continue to hold home antihypertensives at this time   # Right rotator cuff injury  Right shoulder pain - PRN analgesics with Tylenol  and oxycodone    # Hypomagnesemia - resolved   # Generalized weakness Patient has had a decline in strength and mobility, and requires more assistance that his wife is able to provide him at home on a regular basis. - PT/OT eval for rehab needs - recommending acute inpatient rehab - Was screened for CIR but was found not to meet medical necessity criteria -- other rehab options recommended - Fall precautions   # Chronic HFmrEF - TEE done in October 2024 showing EF 40 to 45%, mild mitral regurgitation, and trivial to mild aortic regurgitation.   - TTE (02/23/2024) showed LVEF 40 to 45%, moderate to severe MR, mild AS. - Undergoing TEE tentatively on 10/1 for evaluation of linear structure seen on TTE about the AV without clear independent movement - Clinically euvolemic at this time   # Paroxysmal A-fib - Currently in sinus rhythm - Continue Eliquis  - Continue home Cardizem    # Chronic iron  deficiency anemia - Hemoglobin 9.5, stable (previously ranging between 10.8-12.2 on labs last month).   - Patient is on chronic anticoagulation but not endorsing any symptoms of GI bleed or any other bleeding.    - Continue home iron  supplement.   # Stage I sacral pressure wound - Wound care.   # Hyperlipidemia - Continue Lipitor.   # GERD - Continue Protonix .   # BPH - Continue Flomax    # Peripheral neuropathy - Continue Lyrica        Consultants: ID Procedures performed: TEE  Disposition: Rehabilitation facility Diet recommendation:  Regular diet DISCHARGE MEDICATION: Allergies as of 02/27/2024       Reactions   Levaquin  [levofloxacin ] Itching   Tolerated ciprofloxacin  in 2022.        Medication List     STOP taking these medications    cyclobenzaprine  10 MG tablet Commonly known as: FLEXERIL    diltiazem  30 MG tablet Commonly known as: CARDIZEM    isosorbide  mononitrate 30 MG 24 hr tablet Commonly known as: IMDUR        TAKE these medications    albuterol  108 (90 Base) MCG/ACT inhaler Commonly known as: VENTOLIN  HFA INHALE 2 PUFFS BY MOUTH EVERY 4 HOURS AS NEEDED FOR WHEEZE OR FOR SHORTNESS OF BREATH   amoxicillin -clavulanate 875-125 MG tablet Commonly known as: AUGMENTIN  Take 1 tablet by mouth every 12 (twelve) hours for 7 days.   apixaban  5 MG Tabs tablet Commonly known as: Eliquis  Take 1 tablet (5 mg total) by mouth  2 (two) times daily.   atorvastatin  20 MG tablet Commonly known as: LIPITOR Take 1 tablet (20 mg total) by mouth daily.   daptomycin 700-0.9 MG/100ML-% Soln Commonly known as: CUBICIN Inject 100 mLs (700 mg total) into the vein daily at 2 PM for 24 days.   diltiazem  180 MG 24 hr capsule Commonly known as: Cardizem  CD Take 1 capsule (180 mg total) by mouth daily.   ferrous gluconate  324 MG tablet Commonly known as: FERGON Take 1 tablet (324 mg total) by mouth daily.   ipratropium-albuterol  0.5-2.5 (3) MG/3ML Soln Commonly known as: DUONEB 1 NEBULE EVERY 6 HOURS AS NEEDED. **J45.3**   oxyCODONE  5 MG immediate release tablet Commonly known as: Oxy IR/ROXICODONE  Take 1 tablet (5 mg total) by mouth every 4 (four) hours as  needed for severe pain (pain score 7-10) or moderate pain (pain score 4-6).   pantoprazole  40 MG tablet Commonly known as: PROTONIX  Take 1 tablet (40 mg total) by mouth 2 (two) times daily before a meal.   polyethylene glycol 17 g packet Commonly known as: MIRALAX  / GLYCOLAX  Take 17 g by mouth 2 (two) times daily.   predniSONE  5 MG tablet Commonly known as: DELTASONE  3 tabs daily What changed:  how much to take how to take this when to take this   pregabalin  150 MG capsule Commonly known as: Lyrica  Take 1 capsule (150 mg total) by mouth 2 (two) times daily. What changed:  how much to take when to take this additional instructions   Striverdi Respimat 2.5 MCG/ACT Aers Generic drug: Olodaterol HCl INHALE 2 PUFFS BY MOUTH INTO THE LUNGS DAILY   tamsulosin  0.4 MG Caps capsule Commonly known as: FLOMAX  Take 1 capsule (0.4 mg total) by mouth at bedtime.        Follow-up Information     Jerrell Cleatus Ned, MD Follow up in 2 day(s).   Specialty: Internal Medicine Why: Hospital follow up Contact information: [id number] [place] [address] [phone]                Discharge Exam: Fredricka Weights   02/21/24 1715  Weight: 86.2 kg   General exam: Awake, laying in bed, in nad Respiratory system: Normal respiratory effort, no wheezing Cardiovascular system: regular rate, s1, s2 Gastrointestinal system: Soft, nondistended, positive BS Central nervous system: CN2-12 grossly intact, strength intact Extremities: Perfused, no clubbing Skin: Normal skin turgor, no notable skin lesions seen Psychiatry: Mood normal // no visual hallucinations   Condition at discharge: fair  The results of significant diagnostics from this hospitalization (including imaging, microbiology, ancillary and laboratory) are listed below for reference.   Imaging Studies: US  EKG SITE RITE Result Date: 02/27/2024 If Site Rite image not attached, placement could not be confirmed  due to current cardiac rhythm.  ECHO TEE Result Date: 02/25/2024    TRANSESOPHOGEAL ECHO REPORT   Patient Name:   RODNEY YERA Date of Exam: 02/25/2024 Medical Rec #:  [id number]     Height:       74.0 in Accession #:    [id number]    Weight:       190.0 lb Date of Birth:  12-29-1945     BSA:          2.127 m Patient Age:    77 years      BP:           94/55 mmHg Patient Gender: M  HR:           100 bpm. Exam Location:  Inpatient Procedure: Transesophageal Echo, Color Doppler and Cardiac Doppler (Both            Spectral and Color Flow Doppler were utilized during procedure). Indications:     Aortic Insufficiency i35.1, Bacteremia  History:         Patient has prior history of Echocardiogram examinations, most                  recent 02/23/2024. CHF, COPD, Arrythmias:Atrial Fibrillation;                  Risk Factors:Dyslipidemia.  Sonographer:     Damien Senior RDCS Referring Phys:  [id number] LEONTINE SAILOR LOCKWOOD Diagnosing Phys: Oneil Parchment MD PROCEDURE: After discussion of the risks and benefits of a TEE, an informed consent was obtained from the patient. The transesophogeal probe was passed without difficulty through the esophogus of the patient. Imaged were obtained with the patient in a left lateral decubitus position. Sedation performed by different physician. The patient was monitored while under deep sedation. Anesthestetic sedation was provided intravenously by Anesthesiology: 122mg  of Propofol , 100mg  of Lidocaine . The patient developed no complications during the procedure.  IMPRESSIONS  1. Left ventricular ejection fraction, by estimation, is 55 to 60%. The left ventricle has normal function.  2. Right ventricular systolic function is normal. The right ventricular size is normal.  3. No left atrial/left atrial appendage thrombus was detected.  4. The mitral valve is normal in structure. Mild mitral valve regurgitation.  5. The aortic valve is tricuspid. Aortic valve regurgitation is mild. Aortic  valve sclerosis is present, with no evidence of aortic valve stenosis. Conclusion(s)/Recommendation(s): No evidence of vegetation/infective endocarditis on this transesophageael echocardiogram. FINDINGS  Left Ventricle: Left ventricular ejection fraction, by estimation, is 55 to 60%. The left ventricle has normal function. The left ventricular internal cavity size was normal in size. Right Ventricle: The right ventricular size is normal. No increase in right ventricular wall thickness. Right ventricular systolic function is normal. Left Atrium: Left atrial size was normal in size. No left atrial/left atrial appendage thrombus was detected. Right Atrium: Right atrial size was normal in size. Pericardium: There is no evidence of pericardial effusion. Mitral Valve: The mitral valve is normal in structure. Mild mitral valve regurgitation. Tricuspid Valve: The tricuspid valve is normal in structure. Tricuspid valve regurgitation is trivial. Aortic Valve: The aortic valve is tricuspid. Aortic valve regurgitation is mild. Aortic valve sclerosis is present, with no evidence of aortic valve stenosis. Pulmonic Valve: The pulmonic valve was grossly normal. Pulmonic valve regurgitation is trivial. Aorta: The aortic root and ascending aorta are structurally normal, with no evidence of dilitation. IAS/Shunts: No atrial level shunt detected by color flow Doppler. Additional Comments: Spectral Doppler performed. Oneil Parchment MD Electronically signed by Oneil Parchment MD Signature Date/Time: 02/25/2024/3:04:24 PM    Final    EP STUDY Result Date: 02/25/2024 See surgical note for result.  ECHOCARDIOGRAM COMPLETE Result Date: 02/23/2024    ECHOCARDIOGRAM REPORT   Patient Name:   HEYWARD DOUTHIT Date of Exam: 02/23/2024 Medical Rec #:  [id number]     Height:       74.0 in Accession #:    [id number]    Weight:       190.0 lb Date of Birth:  01/05/1946     BSA:          2.127 m Patient Age:  77 years      BP:           139/69 mmHg Patient  Gender: M             HR:           72 bpm. Exam Location:  Inpatient Procedure: 2D Echo, 3D Echo, Strain Analysis, Cardiac Doppler and Color Doppler            (Both Spectral and Color Flow Doppler were utilized during            procedure). Indications:    Bacteremia R78.81  History:        Patient has prior history of Echocardiogram examinations, most                 recent 03/24/2023. CHF, COPD; Risk Factors:Dyslipidemia and                 Agent Orange Exposure.  Sonographer:    Koleen Popper RDCS Referring Phys: [id number] [facility]  Sonographer Comments: Global longitudinal strain was attempted. IMPRESSIONS  1. Left ventricular ejection fraction, by estimation, is 40 to 45%. The left ventricle has mildly decreased function. The left ventricle demonstrates global hypokinesis. Left ventricular diastolic parameters are consistent with Grade I diastolic dysfunction (impaired relaxation). The average left ventricular global longitudinal strain is -12.7 %. The global longitudinal strain is abnormal.  2. Right ventricular systolic function is normal. The right ventricular size is normal. There is normal pulmonary artery systolic pressure.  3. The mitral valve is normal in structure. Moderate to severe mitral valve regurgitation. No evidence of mitral stenosis.  4. There is a linear structure on the aortic valve seen on the PLAX views. Does not have clear independent movement but does not appear to be artifact. Degree of AR is similar to previous. If high clinical suspiscion, TEE with 3D may be helpful. The aortic valve is tricuspid. Aortic valve regurgitation is mild. Mild aortic valve stenosis.  5. The inferior vena cava is normal in size with greater than 50% respiratory variability, suggesting right atrial pressure of 3 mmHg. FINDINGS  Left Ventricle: Left ventricular ejection fraction, by estimation, is 40 to 45%. The left ventricle has mildly decreased function. The left ventricle demonstrates global  hypokinesis. The average left ventricular global longitudinal strain is -12.7 %. Strain was performed and the global longitudinal strain is abnormal. The left ventricular internal cavity size was normal in size. There is no left ventricular hypertrophy. Left ventricular diastolic parameters are consistent with Grade I diastolic dysfunction (impaired relaxation). Right Ventricle: The right ventricular size is normal. No increase in right ventricular wall thickness. Right ventricular systolic function is normal. There is normal pulmonary artery systolic pressure. The tricuspid regurgitant velocity is 2.41 m/s, and  with an assumed right atrial pressure of 8 mmHg, the estimated right ventricular systolic pressure is 31.2 mmHg. Left Atrium: Left atrial size was normal in size. Right Atrium: Right atrial size was normal in size. Pericardium: There is no evidence of pericardial effusion. Mitral Valve: The mitral valve is normal in structure. Moderate to severe mitral valve regurgitation. No evidence of mitral valve stenosis. MV peak gradient, 2.3 mmHg. The mean mitral valve gradient is 1.0 mmHg. Tricuspid Valve: The tricuspid valve is normal in structure. Tricuspid valve regurgitation is not demonstrated. No evidence of tricuspid stenosis. Aortic Valve: There is a linear structure on the aortic valve seen on the PLAX views. Does not have clear independent movement but does not appear  to be artifact. Degree of AR is similar to previous. If high clinical suspiscion, TEE with 3D may be helpful. The aortic valve is tricuspid. Aortic valve regurgitation is mild. Mild aortic stenosis is present. Aortic valve mean gradient measures 8.0 mmHg. Aortic valve peak gradient measures 15.1 mmHg. Aortic valve area, by VTI measures 1.63 cm. Pulmonic Valve: The pulmonic valve was normal in structure. Pulmonic valve regurgitation is not visualized. No evidence of pulmonic stenosis. Aorta: The aortic root is normal in size and structure.  Venous: The inferior vena cava is normal in size with greater than 50% respiratory variability, suggesting right atrial pressure of 3 mmHg. IAS/Shunts: No atrial level shunt detected by color flow Doppler. Additional Comments: 3D was performed not requiring image post processing on an independent workstation and was abnormal.  LEFT VENTRICLE PLAX 2D LVIDd:         5.90 cm   Diastology LVIDs:         4.60 cm   LV e' medial:    10.10 cm/s LV PW:         1.00 cm   LV E/e' medial:  6.0 LV IVS:        1.10 cm   LV e' lateral:   8.70 cm/s LVOT diam:     2.10 cm   LV E/e' lateral: 6.9 LV SV:         63 LV SV Index:   30        2D Longitudinal Strain LVOT Area:     3.46 cm  2D Strain GLS Avg:     -12.7 %                           3D Volume EF:                          3D EF:        47 %                          LV EDV:       241 ml                          LV ESV:       129 ml                          LV SV:        112 ml RIGHT VENTRICLE             IVC RV Basal diam:  3.90 cm     IVC diam: 2.60 cm RV S prime:     14.00 cm/s TAPSE (M-mode): 2.4 cm LEFT ATRIUM             Index        RIGHT ATRIUM           Index LA diam:        4.20 cm 1.97 cm/m   RA Area:     12.90 cm LA Vol (A2C):   50.2 ml 23.61 ml/m  RA Volume:   28.60 ml  13.45 ml/m LA Vol (A4C):   54.5 ml 25.63 ml/m LA Biplane Vol: 57.1 ml 26.85 ml/m  AORTIC VALVE AV Area (Vmax):    1.71 cm AV Area (Vmean):   1.73 cm  AV Area (VTI):     1.63 cm AV Vmax:           194.00 cm/s AV Vmean:          128.000 cm/s AV VTI:            0.386 m AV Peak Grad:      15.1 mmHg AV Mean Grad:      8.0 mmHg LVOT Vmax:         95.90 cm/s LVOT Vmean:        63.900 cm/s LVOT VTI:          0.182 m LVOT/AV VTI ratio: 0.47  AORTA Ao Root diam: 3.00 cm Ao Asc diam:  3.60 cm MITRAL VALVE               TRICUSPID VALVE MV Area (PHT): 3.77 cm    TR Peak grad:   23.2 mmHg MV Area VTI:   2.86 cm    TR Vmax:        241.00 cm/s MV Peak grad:  2.3 mmHg MV Mean grad:  1.0 mmHg    SHUNTS MV  Vmax:       0.76 m/s    Systemic VTI:  0.18 m MV Vmean:      59.4 cm/s   Systemic Diam: 2.10 cm MV Decel Time: 201 msec MV E velocity: 60.30 cm/s MV A velocity: 58.70 cm/s MV E/A ratio:  1.03 Morene Brownie Electronically signed by Morene Brownie Signature Date/Time: 02/23/2024/9:02:09 PM    Final    CT PELVIS W CONTRAST Result Date: 02/23/2024 CLINICAL DATA:  Perianal abscess or fistula. EXAM: CT PELVIS WITH CONTRAST TECHNIQUE: Multidetector CT imaging of the pelvis was performed using the standard protocol following the bolus administration of intravenous contrast. RADIATION DOSE REDUCTION: This exam was performed according to the departmental dose-optimization program which includes automated exposure control, adjustment of the mA and/or kV according to patient size and/or use of iterative reconstruction technique. CONTRAST:  75mL OMNIPAQUE  IOHEXOL  350 MG/ML SOLN COMPARISON:  CT abdomen pelvis dated 12/26/2023. FINDINGS: Evaluation of this exam is limited due to respiratory motion. Evaluation is also limited due to streak artifact caused by patient's arms and bilateral total hip arthroplasties. Urinary Tract: The urinary bladder is decompressed around a Foley catheter. Bowel: There is sigmoid diverticulosis. No dilated pelvic bowel loops. Vascular/Lymphatic: Mild aortoiliac atherosclerotic disease. No pelvic adenopathy. Reproductive: The prostate and seminal vesicles are grossly unremarkable. Other: There is diffuse subcutaneous edema with induration of the soft tissues of the posteromedial left buttock. No drainable fluid collection or abscess. No soft tissue gas. Musculoskeletal: Bilateral total hip arthroplasties. There is advanced osteopenia. Fractures of the right superior and inferior pubic rami, subacute or chronic. Extensive demineralization of the acetabula bilaterally, right worse than left, which may be partly related to osteopenia or represent particle disease. IMPRESSION: 1. Diffuse subcutaneous  edema with induration of the soft tissues of the posteromedial left buttock. No drainable fluid collection or abscess. No soft tissue gas. 2. Sigmoid diverticulosis. 3. Fractures of the right superior and inferior pubic rami, subacute or chronic. 4.  Aortic Atherosclerosis (ICD10-I70.0). Electronically Signed   By: Vanetta Chou M.D.   On: 02/23/2024 17:57   CT CHEST WO CONTRAST Result Date: 02/22/2024 CLINICAL DATA:  Chronic cough and MRSA infection. EXAM: CT CHEST WITHOUT CONTRAST TECHNIQUE: Multidetector CT imaging of the chest was performed following the standard protocol without IV contrast. RADIATION DOSE REDUCTION: This exam was performed according to the departmental  dose-optimization program which includes automated exposure control, adjustment of the mA and/or kV according to patient size and/or use of iterative reconstruction technique. COMPARISON:  12/26/2023. FINDINGS: Cardiovascular: The heart is enlarged and there is a small pericardial effusion. Scattered coronary artery calcifications are present. There is atherosclerotic calcification of the aorta without evidence of aneurysm. The pulmonary trunk is normal in caliber. Mediastinum/Nodes: No mediastinal or axillary lymphadenopathy. Evaluation of the hila is limited due to lack of IV contrast. The thyroid  gland, trachea, and esophagus are within normal limits. There is a trace right pleural effusion and a small left pleural effusion. Lungs/Pleura: Paraseptal and centrilobular emphysematous changes are present in the lungs. There is a trace right pleural effusion and a small left pleural effusion. Associated atelectasis or infiltrate is present at the lung bases, greater on the left than on the right. There is increased atelectasis in the posterior segment of the left upper lobe. No pneumothorax is seen. Upper Abdomen: No acute abnormality. Musculoskeletal: Shoulder arthroplasty changes are noted on the left. Cervical spinal fusion hardware is  seen. There are degenerative changes in the thoracic spine. Old healed rib fractures are noted bilaterally. No acute fracture is seen. IMPRESSION: 1. Increased patchy atelectasis or infiltrate at the lung bases. 2. Trace right pleural effusion and small left pleural effusion. 3. Emphysema. 4. Coronary artery calcification. 5. Aortic atherosclerosis. Electronically Signed   By: Leita Birmingham M.D.   On: 02/22/2024 18:41   DG Knee 2 Views Right Result Date: 02/21/2024 EXAM: 1 or 2 VIEW(S) XRAY OF THE RIGHT KNEE 02/21/2024 06:23:00 PM COMPARISON: 12/26/2023. CLINICAL HISTORY: stump pain. Reason for exam: stump pain; Per triage notes: Pt bibems from home. Complaints of worsening weakness x 1week. URI x 1 month with productive cough. Sacral wound stage 1. Fever x 2days (100.4 oral). FINDINGS: BONES AND JOINTS: No evidence of acute fracture or osteomyelitis. 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 Below knee amputation. Chronic callus formation at the distal fibular stump. No joint dislocation. No significant joint effusion. SOFT TISSUES: Vascular calcifications. The soft tissues are otherwise unremarkable. IMPRESSION: 1. No acute findings. Electronically signed by: Norman Gatlin MD 02/21/2024 06:46 PM EDT RP Workstation: [id number]   DG Foot 2 Views Left Result Date: 02/21/2024 EXAM: 2 VIEW(S) XRAY OF THE LEFT FOOT 02/21/2024 06:23:00 PM COMPARISON: 09/09/2023 CLINICAL HISTORY: stump pain. Reason for exam: stump pain; Per triage notes: Pt bibems from home. Complaints of worsening weakness x 1week. URI x 1 month with productive cough. Sacral wound stage 1. Fever x 2days (100.4 oral). FINDINGS: BONES AND JOINTS: Screw fixation of the distal third metatarsal noted. No acute fracture or radiographic evidence of osteomyelitis. Chronic fracture deformity of the third metatarsal neck. No joint dislocation. Amputation of the 2nd digit at the MTP joint. 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 SOFT TISSUES: Mild soft tissue  swelling of the forefoot. Vascular calcifications within the soft tissues. IMPRESSION: 1. No acute fracture or radiographic evidence of osteomyelitis Electronically signed by: Norman Gatlin MD 02/21/2024 06:43 PM EDT RP Workstation: [id number]   DG Chest Port 1 View Result Date: 02/21/2024 CLINICAL DATA:  Weakness for 1 week, questionable sepsis EXAM: PORTABLE CHEST 1 VIEW COMPARISON:  12/26/2023 FINDINGS: Single frontal view of the chest demonstrates a stable cardiac silhouette. Increased density in the retrocardiac region may reflect airspace disease or atelectasis. No large effusion or pneumothorax. No acute bony abnormalities. IMPRESSION: 1. Left lower lobe consolidation consistent with atelectasis or airspace disease. Electronically Signed   By: Ozell Delores HERO.D.  On: 02/21/2024 17:50    Microbiology: Results for orders placed or performed during the hospital encounter of 02/21/24  Resp panel by RT-PCR (RSV, Flu A&B, Covid) Anterior Nasal Swab     Status: None   Collection Time: 02/21/24  5:05 PM   Specimen: Anterior Nasal Swab  Result Value Ref Range Status   SARS Coronavirus 2 by RT PCR NEGATIVE NEGATIVE Final   Influenza A by PCR NEGATIVE NEGATIVE Final   Influenza B by PCR NEGATIVE NEGATIVE Final    Comment: (NOTE) The Xpert Xpress SARS-CoV-2/FLU/RSV plus assay is intended as an aid in the diagnosis of influenza from Nasopharyngeal swab specimens and should not be used as a sole basis for treatment. Nasal washings and aspirates are unacceptable for Xpert Xpress SARS-CoV-2/FLU/RSV testing.  Fact Sheet for Patients: BloggerCourse.com  Fact Sheet for Healthcare Providers: SeriousBroker.it  This test is not yet approved or cleared by the [place]  FDA and has been authorized for detection and/or diagnosis of SARS-CoV-2 by FDA under an Emergency Use Authorization (EUA). This EUA will remain in effect (meaning this test can be  used) for the duration of the COVID-19 declaration under Section 564(b)(1) of the Act, 21 U.S.C. section 360bbb-3(b)(1), unless the authorization is terminated or revoked.     Resp Syncytial Virus by PCR NEGATIVE NEGATIVE Final    Comment: (NOTE) Fact Sheet for Patients: BloggerCourse.com  Fact Sheet for Healthcare Providers: SeriousBroker.it  This test is not yet approved or cleared by the [place]  FDA and has been authorized for detection and/or diagnosis of SARS-CoV-2 by FDA under an Emergency Use Authorization (EUA). This EUA will remain in effect (meaning this test can be used) for the duration of the COVID-19 declaration under Section 564(b)(1) of the Act, 21 U.S.C. section 360bbb-3(b)(1), unless the authorization is terminated or revoked.  Performed at [facility], [address] [place], [place] [address]   Blood Culture (routine x 2)     Status: Abnormal (Preliminary result)   Collection Time: 02/21/24  5:17 PM   Specimen: BLOOD  Result Value Ref Range Status   Specimen Description BLOOD LEFT ANTECUBITAL  Final   Special Requests   Final    BOTTLES DRAWN AEROBIC AND ANAEROBIC Blood Culture adequate volume   Culture  Setup Time   Final    GRAM POSITIVE COCCI IN BOTH AEROBIC AND ANAEROBIC BOTTLES CRITICAL RESULT CALLED TO, READ BACK BY AND VERIFIED WITH: PHARMD FELICIANO CLOSE [id number] AT 1054 BY EC    Culture (A)  Final    METHICILLIN RESISTANT STAPHYLOCOCCUS AUREUS Sent to [facility] for further susceptibility testing. Performed at [facility], [address], [place], [place] [address]    Report Status PENDING  Incomplete   Organism ID, Bacteria METHICILLIN RESISTANT STAPHYLOCOCCUS AUREUS  Final      Susceptibility   Methicillin resistant staphylococcus aureus - MIC*    CIPROFLOXACIN  >=8 RESISTANT Resistant     ERYTHROMYCIN >=8 RESISTANT Resistant     GENTAMICIN <=0.5 SENSITIVE Sensitive      OXACILLIN >=4 RESISTANT Resistant     TETRACYCLINE <=1 SENSITIVE Sensitive     VANCOMYCIN  <=0.5 SENSITIVE Sensitive     TRIMETH /SULFA  >=320 RESISTANT Resistant     CLINDAMYCIN <=0.25 SENSITIVE Sensitive     RIFAMPIN <=0.5 SENSITIVE Sensitive     Inducible Clindamycin NEGATIVE Sensitive     LINEZOLID  2 SENSITIVE Sensitive     * METHICILLIN RESISTANT STAPHYLOCOCCUS AUREUS  Blood Culture ID Panel (Reflexed)  Status: Abnormal   Collection Time: 02/21/24  5:17 PM  Result Value Ref Range Status   Enterococcus faecalis NOT DETECTED NOT DETECTED Final   Enterococcus Faecium NOT DETECTED NOT DETECTED Final   Listeria monocytogenes NOT DETECTED NOT DETECTED Final   Staphylococcus species DETECTED (A) NOT DETECTED Final    Comment: CRITICAL RESULT CALLED TO, READ BACK BY AND VERIFIED WITH: PHARMD FELICIANO CLOSE [id number] AT 1054 BY EC    Staphylococcus aureus (BCID) DETECTED (A) NOT DETECTED Final    Comment: Methicillin (oxacillin)-resistant Staphylococcus aureus (MRSA). MRSA is predictably resistant to beta-lactam antibiotics (except ceftaroline). Preferred therapy is vancomycin  unless clinically contraindicated. Patient requires contact precautions if  hospitalized. CRITICAL RESULT CALLED TO, READ BACK BY AND VERIFIED WITH: PHARMD FELICIANO CLOSE [id number] AT 1054 BY EC    Staphylococcus epidermidis NOT DETECTED NOT DETECTED Final   Staphylococcus lugdunensis NOT DETECTED NOT DETECTED Final   Streptococcus species NOT DETECTED NOT DETECTED Final   Streptococcus agalactiae NOT DETECTED NOT DETECTED Final   Streptococcus pneumoniae NOT DETECTED NOT DETECTED Final   Streptococcus pyogenes NOT DETECTED NOT DETECTED Final   A.calcoaceticus-baumannii NOT DETECTED NOT DETECTED Final   Bacteroides fragilis NOT DETECTED NOT DETECTED Final   Enterobacterales NOT DETECTED NOT DETECTED Final   Enterobacter cloacae complex NOT DETECTED NOT DETECTED Final   Escherichia coli NOT DETECTED NOT DETECTED  Final   Klebsiella aerogenes NOT DETECTED NOT DETECTED Final   Klebsiella oxytoca NOT DETECTED NOT DETECTED Final   Klebsiella pneumoniae NOT DETECTED NOT DETECTED Final   Proteus species NOT DETECTED NOT DETECTED Final   Salmonella species NOT DETECTED NOT DETECTED Final   Serratia marcescens NOT DETECTED NOT DETECTED Final   Haemophilus influenzae NOT DETECTED NOT DETECTED Final   Neisseria meningitidis NOT DETECTED NOT DETECTED Final   Pseudomonas aeruginosa NOT DETECTED NOT DETECTED Final   Stenotrophomonas maltophilia NOT DETECTED NOT DETECTED Final   Candida albicans NOT DETECTED NOT DETECTED Final   Candida auris NOT DETECTED NOT DETECTED Final   Candida glabrata NOT DETECTED NOT DETECTED Final   Candida krusei NOT DETECTED NOT DETECTED Final   Candida parapsilosis NOT DETECTED NOT DETECTED Final   Candida tropicalis NOT DETECTED NOT DETECTED Final   Cryptococcus neoformans/gattii NOT DETECTED NOT DETECTED Final   Meth resistant mecA/C and MREJ DETECTED (A) NOT DETECTED Final    Comment: CRITICAL RESULT CALLED TO, READ BACK BY AND VERIFIED WITH: [facility] FELICIANO CLOSE [id number] AT 1054 BY EC Performed at [facility], [address] [place], [place] [address]   MIC (1 Drug)-     Status: None (Preliminary result)   Collection Time: 02/21/24  5:17 PM  Result Value Ref Range Status   Min Inhibitory Conc (1 Drug) Preliminary report  Final    Comment: (NOTE) Performed At: [facility] [address] [place], [place] [id number] Jennette Shorter MD Ey:[phone]    Source DAPTOMYCIN MIC STAPH.AUREUS BLOOD CULTURE  Final    Comment: Performed at [facility], [address], [place], [place] [address]  MIC Result     Status: None   Collection Time: 02/21/24  5:17 PM  Result Value Ref Range Status   Result 1 (MIC) Staphylococcus aureus  Final    Comment: (NOTE) Identification performed by account, not confirmed by this laboratory. DAPTOMYCIN Performed At: [facility] [address] [place], [place] [id number] Jennette Shorter MD Ey:[phone]   Blood Culture (routine x 2)     Status: Abnormal   Collection  Time: 02/21/24  5:31 PM   Specimen: BLOOD  Result Value Ref Range Status   Specimen Description BLOOD RIGHT ANTECUBITAL  Final   Special Requests   Final    BOTTLES DRAWN AEROBIC AND ANAEROBIC Blood Culture adequate volume   Culture  Setup Time   Final    GRAM POSITIVE COCCI ANAEROBIC BOTTLE ONLY CRITICAL VALUE NOTED.  VALUE IS CONSISTENT WITH PREVIOUSLY REPORTED AND CALLED VALUE.    Culture (A)  Final    STAPHYLOCOCCUS AUREUS SUSCEPTIBILITIES PERFORMED ON PREVIOUS CULTURE WITHIN THE LAST 5 DAYS. Performed at [facility], [address] [place], [place] [address]    Report Status 02/24/2024 FINAL  Final  Culture, blood (Routine X 2) w Reflex to ID Panel     Status: None (Preliminary result)   Collection Time: 02/23/24  8:50 PM   Specimen: BLOOD RIGHT ARM  Result Value Ref Range Status   Specimen Description BLOOD RIGHT ARM  Final   Special Requests   Final    BOTTLES DRAWN AEROBIC AND ANAEROBIC Blood Culture adequate volume   Culture   Final    NO GROWTH 4 DAYS Performed at [facility], [address] [place], [place] [address]    Report Status PENDING  Incomplete  Culture, blood (Routine X 2) w Reflex to ID Panel     Status: None (Preliminary result)   Collection Time: 02/23/24  9:09 PM   Specimen: BLOOD LEFT HAND  Result Value Ref Range Status   Specimen Description BLOOD LEFT HAND  Final   Special Requests   Final    BOTTLES DRAWN AEROBIC AND ANAEROBIC Blood Culture adequate volume   Culture   Final    NO GROWTH 4 DAYS Performed at [facility], [address], [place], [place] [address]    Report Status PENDING  Incomplete  MRSA Next Gen by PCR, Nasal     Status: Abnormal   Collection Time: 02/25/24  8:13 AM   Specimen: Nasal Mucosa; Nasal Swab  Result Value Ref Range Status   MRSA by  PCR Next Gen DETECTED (A) NOT DETECTED Final    Comment: RESULT CALLED TO, READ BACK BY AND VERIFIED WITH: RN JALESA M [id number] [id number] FCP (NOTE) The GeneXpert MRSA Assay (FDA approved for NASAL specimens only), is one component of a comprehensive MRSA colonization surveillance program. It is not intended to diagnose MRSA infection nor to guide or monitor treatment for MRSA infections. Test performance is not FDA approved in patients less than 45 years old. Performed at [facility], [address] [place], [place] [address]    *Note: Due to a large number of results and/or encounters for the requested time period, some results have not been displayed. A complete set of results can be found in Results Review.    Labs: CBC: Recent Labs  Lab 02/21/24 1705 02/22/24 0436 02/23/24 0557 02/24/24 0516 02/25/24 0507 02/26/24 0455 02/27/24 0509  WBC 13.7*   < > 9.1 9.6 9.4 12.6* 11.8*  NEUTROABS 11.9*  --   --   --   --   --   --   HGB 9.2*   < > 9.5* 9.6* 9.8* 10.0* 10.0*  HCT 29.1*   < > 30.6* 30.4* 31.4* 31.8* 31.0*  MCV 89.5   < > 90.5 89.7 89.0 89.8 88.3  PLT 157   < > 135* 175 194 217 225   < > = values in this interval not displayed.  Basic Metabolic Panel: Recent Labs  Lab 02/21/24 1705 02/22/24 0436 02/23/24 0557 02/24/24 0516 02/25/24 0507 02/26/24 0455 02/27/24 0509  NA 138 137 136 139 138 142 139  K 3.0* 3.3* 3.2* 3.7 3.0* 3.9 3.5  CL 101 107 106 106 105 108 103  CO2 24 24 20* 21* 25 25 26   GLUCOSE 157* 86 79 100* 99 72 89  BUN 20 18 17 15 11 12 13   CREATININE 1.12 0.89 0.70 0.66 0.39* 0.58* 0.61  CALCIUM  8.2* 7.9* 8.0* 8.1* 8.1* 8.3* 8.6*  MG 1.6* 2.1 2.0  --   --   --   --    Liver Function Tests: Recent Labs  Lab 02/21/24 1705  AST 24  ALT 20  ALKPHOS 74  BILITOT 1.1  PROT 4.9*  ALBUMIN 2.1*   CBG: Recent Labs  Lab 02/21/24 1715 02/27/24 0736  GLUCAP 158* 125*    Discharge time spent: less than 30 minutes.  Signed: Garnette Pelt,  MD [facility] 02/27/2024

## 2024-02-27 NOTE — H&P (Signed)
 Physical Medicine and Rehabilitation Admission H&P        Chief Complaint  Patient presents with   Functional deficits due to debility   S/p R-BKA and Right RTC arthropathy       HPI:  Levi [name] is a 78 year old male with history of HTN, PAF- on eliquis , GERD, HFmrEF (Dr. COPD- steroid dependent, right foot osteomyelitis s/p R-BKA (CIR 6/22-7/11/25), chronic sacral decub, peripheral neuropathy with recent falls, recent admission for PNA 08/25 as well as issues with fall with right hip pain/mild concussion, R-shoulder pain  who was admitted on 02/21/24 with generalized weakness with malaise and poor intake, fevers, non healing wound right medial stump and reports of recent URI with ongoing cough despite course of antibiotics. He was noted have soft BP, lactic acidosis and LLL consolidation concerning for sepsis due to PNA. Foley was placed due to concerns of retention?   He was started on IVF, broad spectrum antibiotics and BC done revealing MRSA.  Dr. Overton consulted and recommended transitioning to Daptomycin as well as TEE which revealing EF 50-55% with mild MVR, AVS without stenosis and negative for shunt, thrombus or endocarditis. He was noted to have left buttock pian with erythema and CT pelvis ordered which showed diffuse SQ edema with induration soft tissue left buttock , fractures of superior and inferior pubic rami subacute or chronic with extensive demineralization but no gas, fluid collection or abscess. Augmentin  added by Dr. Evelynn due to concerns of possible abscess formation. Foley remains in place. PT/OT consulted and patient requires mod assist with transfers. CIR recommended due to functional decline.      Review of Systems  Constitutional:  Negative for fever.  Eyes:  Positive for double vision (occasionally).  Respiratory:  Negative for cough and shortness of breath.   Cardiovascular:  Negative for chest pain, orthopnea and leg swelling.  Gastrointestinal:   Positive for constipation. Negative for abdominal pain and heartburn.  Musculoskeletal:  Positive for joint pain (right shoulder pain) and myalgias.  Skin:  Negative for rash.  Neurological:  Positive for sensory change (LLE numbness) and weakness. Negative for dizziness and headaches.  Psychiatric/Behavioral:  The patient is not nervous/anxious and does not have insomnia.          Past Medical History:  Diagnosis Date   Agent orange exposure 1970's    takes Imdur ,Diltiazem , and Atacand  daily   Agent orange exposure     ALLERGIC RHINITIS     Allergy       seasonal allergies   Anemia      iron  deficiency   Arthritis     Asthma      uses inhaler and nebulizers   Atrial fibrillation (HCC)     Bruises easily      d/t prednisone  daily   Cataract     Cellulitis 03/22/2020   Chest wall pain 09/21/2019    L scapula, reported 4/27 21/     CHF (congestive heart failure) (HCC)      dx-on meds to treat   Chronic bronchitis (HCC)      used to get it q yr; last time ?2008 (04/28/2012)   Clotting disorder 2019    On blood thinner when I bleed it is for a consideral amount of time. This is tegardless of size of wound.   COPD (chronic obstructive pulmonary disease) (HCC)      agent orange exposure   Degenerative disk disease  everywhere (04/28/2012)   Depression due to physical illness 12/15/2023   Diverticulitis     Dysrhythmia      afib   Elevated uric acid in blood      takes Allopurinol  daily   Emphysema of lung (HCC)     Enlarged prostate      but not on any meds   Essential (primary) hypertension 11/15/2020   GERD (gastroesophageal reflux disease)      takes Protonix  daily   H/O hiatal hernia     Headache     History of colonic polyps 02/09/2013    01/2013 - 3 adenomas max 8 mm  01/2015 2 diminutive adenomas - repeat colon 2021     History of colonoscopy     History of pneumonia      a. 2010   History of total right hip arthroplasty 07/31/2015   Hyperlipidemia       takes Lipitor daily; pt. states he takes a preventive   Hypothyroidism     Joint pain     Joint swelling     Leukocytosis     Lumbar stenosis status post T12-S1 fusion 04/07/2017   MSSA bacteremia 03/19/2023   Nausea and vomiting 11/12/2022   Neuromuscular disorder (HCC)      bilat legs, and bilat arms   Neuropathy     Osteoarthritis of left knee 01/10/2012   Osteoporosis 2012   PAF (paroxysmal atrial fibrillation) (HCC)      a. Dx 04/2012, CHA2DS2VASc = 1 (age);  b. 04/2012 Echo: EF 40-50%, mild MR.   Personal history of colonic adenomas 02/09/2013   Pneumonia     Primary osteoarthritis of right knee 06/17/2014   Sepsis (HCC) 03/18/2023   Shortness of breath dyspnea     Thyroid  disease      no taking meds-caused dizziness               Past Surgical History:  Procedure Laterality Date   27 HOUR PH STUDY N/A 05/30/2021    Procedure: 24 HOUR PH STUDY;  Surgeon: San Sandor GAILS, DO;  Location: [facility];  Service: Gastroenterology;  Laterality: N/A;   AMPUTATION Right 11/14/2023    Procedure: AMPUTATION BELOW KNEE;  Surgeon: Harden Jerona GAILS, MD;  Location: [facility] OR;  Service: Orthopedics;  Laterality: Right;   AMPUTATION TOE Bilateral 08/13/2023    Procedure: LEFT SECOND TOE AMPUTATION AND RIGHT FIFTH TOE AMPUTATION;  Surgeon: Malvin Marsa FALCON, DPM;  Location: [facility] OR;  Service: Orthopedics/Podiatry;  Laterality: Bilateral;   ANTERIOR CERVICAL DECOMP/DISCECTOMY FUSION N/A 07/21/2012    Procedure: ANTERIOR CERVICAL DECOMPRESSION/DISCECTOMY FUSION 2 LEVELS;  Surgeon: Victory Gens, MD;  Location: [facility];  Service: Neurosurgery;  Laterality: N/A;  C4-5 C5-6 Anterior cervical decompression/diskectomy/fusion   ANTERIOR LAT LUMBAR FUSION N/A 04/07/2017    Procedure: Thoracic twelve-Lumbar one Anterolateral decompression/fusion;  Surgeon: Gens Victory, MD;  Location: [facility];  Service: Neurosurgery;  Laterality: N/A;   ARTHRODESIS FOOT WITH WEIL OSTEOTOMY Left 11/07/2022     Procedure: LEFT THIRD METATARSOPHALANGEAL CAPSULOTOMY, WEIL OSTEOTOMY;  Surgeon: Elsa Lonni SAUNDERS, MD;  Location: [facility] OR;  Service: Orthopedics;  Laterality: Left;  LENGTH OF SURGERY: 60 MINUTES   BACK SURGERY        x 3   BELOW KNEE LEG AMPUTATION Right      June 2025   BONE EXOSTOSIS EXCISION Right 10/16/2023    Procedure: EXCISION, EXOSTOSIS;  Surgeon: Janit Thresa HERO, DPM;  Location: [facility];  Service: Orthopedics/Podiatry;  Laterality: Right;  CARDIAC CATHETERIZATION   11/2008    Dr. Jeffrie - 20% calcified non flow limiting left main, 50% EF apical hypokinesis   CARPAL TUNNEL RELEASE Bilateral     CERVICAL DISC ARTHROPLASTY N/A 04/07/2017    Procedure: Cervical six-seven Disc arthroplasty;  Surgeon: Colon Shove, MD;  Location: [facility] OR;  Service: Neurosurgery;  Laterality: N/A;   CHEST TUBE INSERTION Left 04/11/2017    Procedure: CHEST TUBE INSERTION;  Surgeon: Fleeta Hanford Coy, MD;  Location: [facility] OR;  Service: Thoracic;  Laterality: Left;   CHOLECYSTECTOMY       COLONOSCOPY   2016    CG-MAC-miralax (good)servere TICS/TA x 2   ESOPHAGEAL MANOMETRY N/A 05/30/2021    Procedure: ESOPHAGEAL MANOMETRY (EM);  Surgeon: San Sandor GAILS, DO;  Location: [facility];  Service: Gastroenterology;  Laterality: N/A;  ph impedence   ESOPHAGOGASTRODUODENOSCOPY       EYE SURGERY Bilateral 2011    steroidal encapsulation (04/28/2012)   FOOT SURGERY Right      x 2   JOINT REPLACEMENT        Many surgeries including 5 joit replacements   KNEE ARTHROSCOPY Bilateral     LATERAL / POSTERIOR COMBINED FUSION LUMBAR SPINE   2012   LEFT ATRIAL APPENDAGE OCCLUSION N/A 02/22/2021    Procedure: LEFT ATRIAL APPENDAGE OCCLUSION;  Surgeon: Cindie Ole DASEN, MD;  [facility];  Service: Cardiovascular;  Laterality: N/A;   NEUROPLASTY / TRANSPOSITION ULNAR NERVE AT ELBOW Bilateral     POLYPECTOMY   2016    severe TICS/TA x 2   POSTERIOR CERVICAL LAMINECTOMY WITH MET- RX Right 06/11/2021     Procedure: Right Cervical six-seven  Laminectomy and foraminotomy with metrex;  Surgeon: Colon Shove, MD;  Location: [facility];  Service: Neurosurgery;  Laterality: Right;   REFRACTIVE SURGERY Left 07/07/2023    laser surgery but still has floaters   REVERSE SHOULDER ARTHROPLASTY   04/28/2012    Procedure: REVERSE SHOULDER ARTHROPLASTY;  Surgeon: Eva Elsie Herring, MD;  Location: [facility] OR;  Service: Orthopedics;  Laterality: Left;  Left shouder reverse total shoulder arthroplasty   SHOULDER SURGERY       SPINE SURGERY        I have had 7 spinal surgeries   TEE WITHOUT CARDIOVERSION N/A 02/22/2021    Procedure: TRANSESOPHAGEAL ECHOCARDIOGRAM (TEE);  Surgeon: Cindie Ole DASEN, MD;  Location: [facility] INVASIVE CV LAB;  Service: Cardiovascular;  Laterality: N/A;   TENDON REPAIR Right 08/07/2020    Procedure: RIGHT HAND LIGAMENT RECONSTRUCTION AND TENDON INTERPOSITION;  Surgeon: Yvone Rush, MD;  Location: [facility];  Service: Orthopedics;  Laterality: Right;   TONSILLECTOMY AND ADENOIDECTOMY   1954   TOTAL HIP ARTHROPLASTY Left 2011    left (04/28/2012)   TOTAL HIP ARTHROPLASTY Right 07/31/2015    Procedure: TOTAL HIP ARTHROPLASTY ANTERIOR APPROACH;  Surgeon: Rush Yvone, MD;  Location: [facility];  Service: Orthopedics;  Laterality: Right;   TOTAL KNEE ARTHROPLASTY   01/10/2012    Procedure: TOTAL KNEE ARTHROPLASTY;  Surgeon: Rush LITTIE Yvone, MD;  Location: [facility];  Service: Orthopedics;;  left total knee arthroplasty   TOTAL KNEE ARTHROPLASTY Right 06/17/2014    Procedure: TOTAL KNEE ARTHROPLASTY;  Surgeon: Rush LITTIE Yvone, MD;  Location: [facility];  Service: Orthopedics;  Laterality: Right;   toupet fundolplication   11/01/2021   TRANSESOPHAGEAL ECHOCARDIOGRAM (CATH LAB) N/A 03/24/2023    Procedure: TRANSESOPHAGEAL ECHOCARDIOGRAM;  Surgeon: Mona Vinie BROCKS, MD;  [facility]: [facility];  Service:  Cardiovascular;  Laterality: N/A;   TRANSESOPHAGEAL ECHOCARDIOGRAM (CATH LAB) N/A  02/25/2024    Procedure: TRANSESOPHAGEAL ECHOCARDIOGRAM;  Surgeon: Jeffrie Oneil BROCKS, MD;  Location: [facility];  Service: Cardiovascular;  Laterality: N/A;               Family History  Problem Relation Age of Onset   Diabetes Mother     Heart disease Father          Died 78, [place]   Cancer Father     Hyperlipidemia Father     Stroke Father     Prostate cancer Brother     Colon polyps Brother     Diabetes Brother     Colon polyps Brother     Asthma Brother     COPD Brother     Colon cancer Brother          Dx age 57   Cancer Brother     Colon cancer Other     Esophageal cancer Neg Hx     Rectal cancer Neg Hx     Stomach cancer Neg Hx     Pancreatic cancer Neg Hx     Kidney disease Neg Hx     Liver disease Neg Hx     Neuropathy Neg Hx            Social History:  reports that he has never smoked. He has never used smokeless tobacco. He reports that he does not drink alcohol and does not use drugs.     Allergies       Allergies  Allergen Reactions   Levaquin  [Levofloxacin ] Itching      Tolerated ciprofloxacin  in 2022.            Medications Prior to Admission  Medication Sig Dispense Refill   albuterol  (VENTOLIN  HFA) 108 (90 Base) MCG/ACT inhaler INHALE 2 PUFFS BY MOUTH EVERY 4 HOURS AS NEEDED FOR WHEEZE OR FOR SHORTNESS OF BREATH 8 each 4   apixaban  (ELIQUIS ) 5 MG TABS tablet Take 1 tablet (5 mg total) by mouth 2 (two) times daily. 180 tablet 1   atorvastatin  (LIPITOR) 20 MG tablet Take 1 tablet (20 mg total) by mouth daily. 90 tablet 3   cyclobenzaprine  (FLEXERIL ) 10 MG tablet TAKE 1 TABLET BY MOUTH THREE TIMES A DAY AS NEEDED FOR MUSCLE SPASMS 90 tablet 3   diltiazem  (CARDIZEM  CD) 180 MG 24 hr capsule Take 1 capsule (180 mg total) by mouth daily. 90 capsule 3   diltiazem  (CARDIZEM ) 30 MG tablet Take 30 mg by mouth daily as needed (for high HR).       ferrous gluconate  (FERGON) 324 MG tablet Take 1 tablet (324 mg total) by mouth daily. 30 tablet 0    ipratropium-albuterol  (DUONEB) 0.5-2.5 (3) MG/3ML SOLN 1 NEBULE EVERY 6 HOURS AS NEEDED. **J45.3** 270 mL 4   isosorbide  mononitrate (IMDUR ) 30 MG 24 hr tablet Take 1 tablet (30 mg total) by mouth daily. 90 tablet 3   Olodaterol HCl (STRIVERDI RESPIMAT) 2.5 MCG/ACT AERS INHALE 2 PUFFS BY MOUTH INTO THE LUNGS DAILY 4 g 11   pantoprazole  (PROTONIX ) 40 MG tablet Take 1 tablet (40 mg total) by mouth 2 (two) times daily before a meal. 60 tablet 0   predniSONE  (DELTASONE ) 5 MG tablet 3 tabs daily (Patient taking differently: Take 20 mg by mouth daily with breakfast. 3 tabs daily) 100 tablet 5   pregabalin  (LYRICA ) 150 MG capsule Take 1 capsule (150 mg total) by mouth  2 (two) times daily. (Patient taking differently: Take 75-150 mg by mouth See admin instructions. Takes 150 mg in the morning and 75 in the evening) 60 capsule 2   tamsulosin  (FLOMAX ) 0.4 MG CAPS capsule Take 1 capsule (0.4 mg total) by mouth at bedtime. 30 capsule 0        Home: Home Living Family/patient expects to be discharged to:: Private residence Living Arrangements: Spouse/significant other Available Help at Discharge: Family, Available 24 hours/day Type of Home: House Home Access: Ramped entrance Home Layout: Able to live on main level with bedroom/bathroom Bathroom Shower/Tub: Health visitor: Handicapped height Bathroom Accessibility: Yes Home Equipment: Information systems manager, BSC/3in1, Agricultural consultant (2 wheels), Cane - single point, Rollator (4 wheels), Wheelchair - manual, Toilet riser Additional Comments: pt reports he was supposed to get a prosthetic for RLE next week   Functional History: Prior Function Prior Level of Function : Needs assist Mobility Comments: uses slide board to transfer to w/c, ADLs Comments: wife assists PRN for ADLs   Functional Status:  Mobility: Bed Mobility Overal bed mobility: Needs Assistance Bed Mobility: Rolling, Supine to Sit, Sit to Supine Rolling: Mod assist Supine to sit:  Mod assist, Max assist Sit to supine: Mod assist General bed mobility comments: up/down via L elbow but without full rolling and heavy mod.  Worked on rolling from a hook-lying position with intent on building momentum, but did not add to full transition to EOB yet. Transfers Overall transfer level: Needs assistance Equipment used: Rolling walker (2 wheels) Transfers: Sit to/from Stand Sit to Stand: Mod assist, From elevated surface (x5) Bed to/from chair/wheelchair/BSC transfer type:: Lateral/scoot transfer  Lateral/Scoot Transfers: Mod assist, Contact guard assist (mod initially to feel the movement and worked to [employer] for small scoot up 2 feet in bed.) General transfer comment: pt completed hip lift from EOB with BUE support and LLE x 5 at start of session with good clearance, then completed x6 sit-stand from EOB with modA of 2 and elevated bed. BUE support on RW and cues to maintain RW on ground, extend at hips, and maintain upright trunk, up to 15 sec standing tolerance Ambulation/Gait General Gait Details: pt unable to advance LLE   ADL: ADL Overall ADL's : Needs assistance/impaired Eating/Feeding: Set up, Sitting Grooming: Set up, Sitting Upper Body Bathing: Minimal assistance, Sitting Lower Body Bathing: Sitting/lateral leans, Moderate assistance Upper Body Dressing : Moderate assistance, Sitting Lower Body Dressing: Moderate assistance, Sitting/lateral leans Toilet Transfer: Maximal assistance Toileting- Clothing Manipulation and Hygiene: Total assistance Functional mobility during ADLs: Total assistance   Cognition: Cognition Orientation Level: Oriented X4 Cognition Arousal: Alert Behavior During Therapy: WFL for tasks assessed/performed     Blood pressure 110/75, pulse 92, temperature 98.1 F (36.7 C), temperature source Oral, resp. rate 18, height 6' 2 (1.88 m), weight 86.2 kg, SpO2 93%. Physical Exam Vitals and nursing note reviewed.  Constitutional: No apparent  distress. Appropriate appearance for age. Laying in bed.  HENT: No JVD. Neck Supple. Trachea midline. Atraumatic, normocephalic. Eyes: PERRLA. EOMI. Visual fields grossly intact.  Cardiovascular: RRR, no murmurs/rub/gallops. No Edema.  Respiratory: CTAB. No rales, rhonchi, or wheezing. On RA.  Abdomen: + bowel sounds, normoactive. No distention or tenderness.  GU: Not examined. +Foley, draining clear urine.  Skin:   Papery skin with diffuse ecchymotic and flaking BUE. RUE with evidence of third spacing and dependent edema around elbow.  LLE with multiple scabs covered with foam dressing.  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  Pressure area with scab on tibial prominence.    MSK:      + R BKA       + L second toe amputation       + Right shoulder range of motion 0 degrees in flexion, abduction; right shoulder visibly protruding forward from the socket.  No crepitus with passive range of motion.       Neurologic exam:  Cognition: AAO to person, place, time and event.  Language: Fluent, No substitutions or neoglisms. No dysarthria. Names 3/3 objects correctly.  Memory: Recalls 3/3 objects at 5 minutes. No apparent deficits  Insight: Good  insight into current condition.  Mood: Pleasant affect, appropriate mood.  Sensation: To light reduced in left foot and bilateral fingertips in stocking glove pattern.  + Difficulty with proprioception in right hand digits 3 through 5  Reflexes: 2+ in BL UE and LEs. Negative Hoffman's and babinski signs bilaterally.  CN: 2-12 grossly intact.  Coordination: No apparent tremors. No ataxia  Spasticity: MAS 0 in all extremities.       Strength:                RUE: 0/5 SA, 2/5 EF, 3/5 EE, 4/5 WE, 4/5 FF, 4/5 FA                LUE:  4/5 SA, 4/5 EF, 4/5 EE, 4/5 WE, 4/5 FF, 4/5 FA                RLE: 4/5 HF, 5/5 KE                 LLE:  4/5 HF, 5/5 KE, 3/5  DF, 3/5  EHL, 2/5  PF       Lab Results Last 48 Hours        Results for  orders placed or performed during the hospital encounter of 02/21/24 (from the past 48 hours)  CBC     Status: Abnormal    Collection Time: 02/26/24  4:55 AM  Result Value Ref Range    WBC 12.6 (H) 4.0 - 10.5 K/uL    RBC 3.54 (L) 4.22 - 5.81 MIL/uL    Hemoglobin 10.0 (L) 13.0 - 17.0 g/dL    HCT 68.1 (L) 60.9 - 52.0 %    MCV 89.8 80.0 - 100.0 fL    MCH 28.2 26.0 - 34.0 pg    MCHC 31.4 30.0 - 36.0 g/dL    RDW 82.6 (H) 88.4 - 15.5 %    Platelets 217 150 - 400 K/uL    nRBC 0.2 0.0 - 0.2 %      Comment: Performed at [facility], [address], [place], [place] [address]  Basic metabolic panel with GFR     Status: Abnormal    Collection Time: 02/26/24  4:55 AM  Result Value Ref Range    Sodium 142 135 - 145 mmol/L    Potassium 3.9 3.5 - 5.1 mmol/L    Chloride 108 98 - 111 mmol/L    CO2 25 22 - 32 mmol/L    Glucose, Bld 72 70 - 99 mg/dL      Comment: Glucose reference range applies only to samples taken after fasting for at least 8 hours.    BUN 12 8 - 23 mg/dL    Creatinine, Ser 9.41 (L) 0.61 - 1.24 mg/dL    Calcium  8.3 (L) 8.9 - 10.3 mg/dL    GFR, Estimated >39 >39 mL/min  Comment: (NOTE) Calculated using the CKD-EPI Creatinine Equation (2021)      Anion gap 9 5 - 15      Comment: Performed at [facility], [address] [place], [place] [address]  CBC     Status: Abnormal    Collection Time: 02/27/24  5:09 AM  Result Value Ref Range    WBC 11.8 (H) 4.0 - 10.5 K/uL    RBC 3.51 (L) 4.22 - 5.81 MIL/uL    Hemoglobin 10.0 (L) 13.0 - 17.0 g/dL    HCT 68.9 (L) 60.9 - 52.0 %    MCV 88.3 80.0 - 100.0 fL    MCH 28.5 26.0 - 34.0 pg    MCHC 32.3 30.0 - 36.0 g/dL    RDW 82.5 (H) 88.4 - 15.5 %    Platelets 225 150 - 400 K/uL    nRBC 0.0 0.0 - 0.2 %      Comment: Performed at [facility], [address] [place], [place] [address]  Basic metabolic panel with GFR     Status: Abnormal    Collection Time: 02/27/24  5:09 AM  Result Value Ref Range    Sodium 139  135 - 145 mmol/L    Potassium 3.5 3.5 - 5.1 mmol/L    Chloride 103 98 - 111 mmol/L    CO2 26 22 - 32 mmol/L    Glucose, Bld 89 70 - 99 mg/dL      Comment: Glucose reference range applies only to samples taken after fasting for at least 8 hours.    BUN 13 8 - 23 mg/dL    Creatinine, Ser 9.38 0.61 - 1.24 mg/dL    Calcium  8.6 (L) 8.9 - 10.3 mg/dL    GFR, Estimated >39 >39 mL/min      Comment: (NOTE) Calculated using the CKD-EPI Creatinine Equation (2021)      Anion gap 10 5 - 15      Comment: Performed at [facility] Lab, [address] [place], [place] [address]  Glucose, capillary     Status: Abnormal    Collection Time: 02/27/24  7:36 AM  Result Value Ref Range    Glucose-Capillary 125 (H) 70 - 99 mg/dL      Comment: Glucose reference range applies only to samples taken after fasting for at least 8 hours.    Comment 1 Notify RN      Comment 2 Document in Chart    Glucose, capillary     Status: None    Collection Time: 02/27/24 11:27 AM  Result Value Ref Range    Glucose-Capillary 93 70 - 99 mg/dL      Comment: Glucose reference range applies only to samples taken after fasting for at least 8 hours.    Comment 1 Notify RN      Comment 2 Document in Chart      *Note: Due to a large number of results and/or encounters for the requested time period, some results have not been displayed. A complete set of results can be found in Results Review.       Imaging Results (Last 48 hours)  US  EKG SITE RITE Result Date: 02/27/2024 If Site Rite image not attached, placement could not be confirmed due to current cardiac rhythm.   ECHO TEE Result Date: 02/25/2024    TRANSESOPHOGEAL ECHO REPORT   Patient Name:   Levi [name] Date of Exam: 02/25/2024 Medical Rec #:  [id number]     Height:  74.0 in Accession #:    [id number]    Weight:       190.0 lb Date of Birth:  1945/07/05     BSA:          2.127 m Patient Age:    77 years      BP:           94/55 mmHg Patient Gender: M              HR:           100 bpm. Exam Location:  Inpatient Procedure: Transesophageal Echo, Color Doppler and Cardiac Doppler (Both            Spectral and Color Flow Doppler were utilized during procedure). Indications:     Aortic Insufficiency i35.1, Bacteremia  History:         Patient has prior history of Echocardiogram examinations, most                  recent 02/23/2024. CHF, COPD, Arrythmias:Atrial Fibrillation;                  Risk Factors:Dyslipidemia.  Sonographer:     Damien Senior RDCS Referring Phys:  [id number] LEONTINE SAILOR LOCKWOOD Diagnosing Phys: Oneil Parchment MD PROCEDURE: After discussion of the risks and benefits of a TEE, an informed consent was obtained from the patient. The transesophogeal probe was passed without difficulty through the esophogus of the patient. Imaged were obtained with the patient in a left lateral decubitus position. Sedation performed by different physician. The patient was monitored while under deep sedation. Anesthestetic sedation was provided intravenously by Anesthesiology: 122mg  of Propofol , 100mg  of Lidocaine . The patient developed no complications during the procedure.  IMPRESSIONS  1. Left ventricular ejection fraction, by estimation, is 55 to 60%. The left ventricle has normal function.  2. Right ventricular systolic function is normal. The right ventricular size is normal.  3. No left atrial/left atrial appendage thrombus was detected.  4. The mitral valve is normal in structure. Mild mitral valve regurgitation.  5. The aortic valve is tricuspid. Aortic valve regurgitation is mild. Aortic valve sclerosis is present, with no evidence of aortic valve stenosis. Conclusion(s)/Recommendation(s): No evidence of vegetation/infective endocarditis on this transesophageael echocardiogram. FINDINGS  Left Ventricle: Left ventricular ejection fraction, by estimation, is 55 to 60%. The left ventricle has normal function. The left ventricular internal cavity size was normal in size. Right  Ventricle: The right ventricular size is normal. No increase in right ventricular wall thickness. Right ventricular systolic function is normal. Left Atrium: Left atrial size was normal in size. No left atrial/left atrial appendage thrombus was detected. Right Atrium: Right atrial size was normal in size. Pericardium: There is no evidence of pericardial effusion. Mitral Valve: The mitral valve is normal in structure. Mild mitral valve regurgitation. Tricuspid Valve: The tricuspid valve is normal in structure. Tricuspid valve regurgitation is trivial. Aortic Valve: The aortic valve is tricuspid. Aortic valve regurgitation is mild. Aortic valve sclerosis is present, with no evidence of aortic valve stenosis. Pulmonic Valve: The pulmonic valve was grossly normal. Pulmonic valve regurgitation is trivial. Aorta: The aortic root and ascending aorta are structurally normal, with no evidence of dilitation. IAS/Shunts: No atrial level shunt detected by color flow Doppler. Additional Comments: Spectral Doppler performed. Oneil Parchment MD Electronically signed by Oneil Parchment MD Signature Date/Time: 02/25/2024/3:04:24 PM    Final     EP STUDY Result Date: 02/25/2024 See surgical note for result.  Blood pressure 110/75, pulse 92, temperature 98.1 F (36.7 C), temperature source Oral, resp. rate 18, height 6' 2 (1.88 m), weight 86.2 kg, SpO2 93%.   Medical Problem List and Plan: 1. Functional deficits secondary to debility secondary to MRSA bacteremia d/t pneumonia             -patient may shower             -ELOS/Goals: 14-16 days, SPV PT/OT   - Stable for IRF   - Patient states he has right BKA prosthesis awaiting pickup; would contact [employer] this week to see if it can be brought in.  2.  Antithrombotics: -DVT/anticoagulation:  Pharmaceutical: Eliquis              -antiplatelet therapy: N/A 3. Pain Management: Oxycodone  prn for severe pain. Tylenol  prn for  mild pain.  4. Mood/Behavior/Sleep: LCSW to  follow for evaluation and support.              --trazodone  prn for insomnia.              -antipsychotic agents: N/A 5. Neuropsych/cognition: This patient is capable of making decisions on his own behalf. 6. Skin/Wound Care:  Routine pressure relief measures.    7. Fluids/Electrolytes/Nutrition:  Monitor I/O. Check CMET in am. Check CK  8. MRSA bacteremia: On Daptomycin X 4 weeks with EOT 03/22/24 --Ck-119 on 09/29. Recheck on Monday.   --Recurrent leucocytosis 13.7-->9.4-->11.8 9. Indurated area left buttock/cellulitis?: Monitor for worsening. Augmentin  added on 09/30 w/ antibiotic duration to continue pending improvement.  --Repeat imaging in case of worsening to rule out abscess formation.   10. A fib: Monitor HR TID--on Eliquis . HR controlled on cardizem .   11. HFmrEF: Monitor daily weights and for signs of overload.             --On Lipitor, Eliquis --off Imdur  due to soft BP.   11. COPD: On chronic prednisone  20 mg (unable to tolerate taper)             --continue Brovana  BID with duo nebs prn. Hypokalemia: Being supplemented intermittently. Low normal currently.  12. Hypomagnesemia: Improved with supplementation. Was 1.6-->2.0             --recheck in am.  13. Constipation: Last BM 09/30--on senna-->increase to 2  and continue miralax  BID.             --MOM today. Increase as needed.    14.  Neuropathy: Followed by [facility].              --On Lyrica  75 mg TID --off duloxetine .   15. Anemia: Continue iron  supplement.  16. H/o BPH: Foley remains in place-->work on bowel regimen today and remove foley to start voiding trial tomorrow.    17.  Right shoulder rotator cuff tear.  Last [facility]  - Has outpatient CT shoulder ordered; would consider getting while he is inpatient to help plan outpatient surgery  - No formal weightbearing precautions  Sharlet GORMAN Schmitz, PA-C 02/27/2024  I have examined the patient independently and edited the note  for HPI, ROS, exam, assessment, and plan as appropriate. I am in agreement with the above recommendations.   Joesph JAYSON Likes, DO 02/27/2024

## 2024-02-27 NOTE — Progress Notes (Signed)
 Peripherally Inserted Central Catheter Placement  The IV Nurse has discussed with the patient and/or persons authorized to consent for the patient, the purpose of this procedure and the potential benefits and risks involved with this procedure.  The benefits include less needle sticks, lab draws from the catheter, and the patient may be discharged home with the catheter. Risks include, but not limited to, infection, bleeding, blood clot (thrombus formation), and puncture of an artery; nerve damage and irregular heartbeat and possibility to perform a PICC exchange if needed/ordered by physician.  Alternatives to this procedure were also discussed.  Bard Power PICC patient education guide, fact sheet on infection prevention and patient information card has been provided to patient /or left at bedside.    PICC Placement Documentation  PICC Single Lumen 02/27/24 Right Basilic 41 cm 0 cm (Active)  Indication for Insertion or Continuance of Line Prolonged intravenous therapies 02/27/24 1240  Exposed Catheter (cm) 0 cm 02/27/24 1240  Site Assessment Clean, Dry, Intact 02/27/24 1240  Line Status Flushed;Saline locked;Blood return noted 02/27/24 1240  Dressing Type Transparent;Securing device 02/27/24 1240  Dressing Status Antimicrobial disc/dressing in place;Clean, Dry, Intact 02/27/24 1240  Line Care Connections checked and tightened 02/27/24 1240  Line Adjustment (NICU/IV Team Only) No 02/27/24 1240  Dressing Intervention New dressing;Adhesive placed at insertion site (IV team only) 02/27/24 1240  Dressing Change Due 03/05/24 02/27/24 1240   Wife signed consent at bedside    Renaee Neville Skillern 02/27/2024, 12:53 PM

## 2024-02-27 NOTE — TOC Transition Note (Addendum)
 Transition of Care [facility]) - Discharge Note   Patient Details  Name: Levi [name] MRN: [id number] Date of Birth: 1945-12-22  [facility] CM/SW Contact:  Sudie Erminio Deems, RN Phone Number: 02/27/2024, 11:10 AM   Clinical Narrative: Inpatient Case Manager received notification that the patient has bed availability at [facility]. No further discharge needs identified at this time.   02-27-24 1154 ICM reached out to [employer] Liaison Holley Herring for IV antibiotic therapy education. Pam will follow the patient at [facility] to provide education to patient and spouse as he gets closer to discharge. PICC Line to be placed today. No further needs identified.    Final next level of care: IP Rehab Facility Barriers to Discharge: No Barriers Identified  Discharge Plan and Services Additional resources added to the After Visit Summary for   In-house Referral: NA Discharge Planning Services: CM Consult              DME Agency: NA    Social Drivers of Health (SDOH) Interventions SDOH Screenings   Food Insecurity: No Food Insecurity (02/22/2024)  Housing: Low Risk  (02/22/2024)  Transportation Needs: No Transportation Needs (02/22/2024)  Recent Concern: Transportation Needs - Unmet Transportation Needs (02/05/2024)  Utilities: Not At Risk (02/22/2024)  Alcohol Screen: Low Risk  (10/23/2023)  Depression (PHQ2-9): High Risk (02/09/2024)  Financial Resource Strain: Low Risk  (02/05/2024)  Physical Activity: Inactive (02/05/2024)  Social Connections: Moderately Integrated (02/22/2024)  Stress: No Stress Concern Present (02/05/2024)  Tobacco Use: Low Risk  (02/21/2024)  Health Literacy: Adequate Health Literacy (10/23/2023)   Readmission Risk Interventions     No data to display

## 2024-02-27 NOTE — Progress Notes (Signed)
 [facility]  Date of Admission:  02/21/2024      Total days of antibiotics 6   Daptomycin         ASSESSMENT: Levi [name] is a 78 y.o. male admitted with   MRSA Bacteremia, Community Onset -  No clear cause identified with no localizing pain and negative TEE> Multiple cuts and abrasions with frail skin (likely d/t chronic prednisone  use). Given extensive hardware in knees, hips, left ankle, left shoulder and back would recommend 4 weeks of IV therapy to treat this EOT 10/27.    Pain and induration on Left buttock -  Continue Augmentin  x 10 days through 03/03/24 Can extend beyond if remains tender / indurated or incompletely resolved.  If worsens please repeat imaging to ensure no evolved abscess that would need consideration of drainage  Vascular Access -  -PICC ordered -Home health orders to maintain PICC line care and education for patient described below   Discharge Planning / Coordination of Care -  -Outpatient antibiotics set -Discussed with Holley Herring, ID pharmacy and primary   Medication Monitoring -  -Safety labs ordered and detailed below to be followed   ID will sign off - please call back with any questions/concerns or if we can be of further assistance and when discharge from [facility] comes.    PLAN: IV antibiotic plan as outlined below He will need some treatment at home after [facility] discharge - will touch base with Pam as well  Augmentin  through 10/08 and re-evaluation to ensure resolved   OPAT ORDERS:  Diagnosis: Bacteremia, Unexplained   Culture Result: MRSA  Allergies  Allergen Reactions   Levaquin  [Levofloxacin ] Itching    Tolerated ciprofloxacin  in 2022.     Discharge antibiotics to be given via PICC line:  Daptomycin 700 mg IV Q24h    Duration: 4 weeks   End Date: 03/22/2024  [facility] Care Per Protocol with [employer] Use: Home health RN for IV administration and teaching, line care and labs.    Labs  weekly while on IV antibiotics: _x_ CBC with differential __ BMP **TWICE WEEKLY ON VANCOMYCIN   _x_ CMP __ CRP __ ESR __ Vancomycin  trough TWICE WEEKLY __ CK  _x_ Please pull PIC at completion of IV antibiotics __ Please leave PIC in place until doctor has seen patient or been notified  Fax weekly labs to [phone]  Clinic Follow Up Appt: 11/04 @ 1:45 pm with Dr. Overton     Principal Problem:   MRSA bacteremia Active Problems:   Paroxysmal atrial fibrillation (HCC)   Hyperlipidemia   GERD (gastroesophageal reflux disease) status post Nissen fundoplication   COPD (chronic obstructive pulmonary disease) (HCC)   BPH (benign prostatic hyperplasia)   Sepsis (HCC)   Left lower lobe pneumonia   AKI (acute kidney injury)   Hypokalemia    amoxicillin -clavulanate  1 tablet Oral Q12H   apixaban   5 mg Oral BID   arformoterol   15 mcg Nebulization BID   atorvastatin   20 mg Oral Daily   Chlorhexidine  Gluconate Cloth  6 each Topical Daily   diltiazem   180 mg Oral Daily   ferrous gluconate   324 mg Oral Daily   melatonin  5 mg Oral QHS   mupirocin ointment  1 Application Nasal BID   pantoprazole   40 mg Oral BID AC   polyethylene glycol  17 g Oral BID   predniSONE   20 mg Oral Q breakfast   pregabalin   150 mg Oral Daily   pregabalin   75 mg Oral QPM   tamsulosin   0.4 mg Oral QHS    SUBJECTIVE: Doing well. Buttock is sore but not worse. Nothing new. Approved for inpatient rehab.  Has not had a BM yet   Review of Systems: Review of Systems  Constitutional:  Negative for chills, fever and malaise/fatigue.  Respiratory:  Negative for cough.   Cardiovascular:  Negative for chest pain.  Gastrointestinal:  Negative for abdominal pain, diarrhea, nausea and vomiting.    Allergies  Allergen Reactions   Levaquin  [Levofloxacin ] Itching    Tolerated ciprofloxacin  in 2022.    OBJECTIVE: Vitals:   02/26/24 2326 02/27/24 0404 02/27/24 0740 02/27/24 0901  BP: (!) 144/72 (!) 167/73  110/75   Pulse: (!) 45 (!) 46 92   Resp: 18 18    Temp: 97.8 F (36.6 C) 98.8 F (37.1 C) 98.1 F (36.7 C)   TempSrc: Oral Oral Oral   SpO2: 97% 95% 96% 93%  Weight:      Height:       Body mass index is 24.39 kg/m.  Physical Exam Constitutional:      Appearance: Normal appearance. He is not ill-appearing.  HENT:     Head: Normocephalic.     Mouth/Throat:     Mouth: Mucous membranes are moist.     Pharynx: Oropharynx is clear.  Eyes:     General: No scleral icterus. Cardiovascular:     Rate and Rhythm: Normal rate and regular rhythm.  Pulmonary:     Effort: Pulmonary effort is normal.  Musculoskeletal:        General: Normal range of motion.     Cervical back: Normal range of motion.  Skin:    Coloration: Skin is not jaundiced or pale.  Neurological:     Mental Status: He is alert and oriented to person, place, and time.  Psychiatric:        Mood and Affect: Mood normal.        Judgment: Judgment normal.     Lab Results Lab Results  Component Value Date   WBC 11.8 (H) 02/27/2024   HGB 10.0 (L) 02/27/2024   HCT 31.0 (L) 02/27/2024   MCV 88.3 02/27/2024   PLT 225 02/27/2024    Lab Results  Component Value Date   CREATININE 0.61 02/27/2024   BUN 13 02/27/2024   NA 139 02/27/2024   K 3.5 02/27/2024   CL 103 02/27/2024   CO2 26 02/27/2024    Lab Results  Component Value Date   ALT 20 02/21/2024   AST 24 02/21/2024   ALKPHOS 74 02/21/2024   BILITOT 1.1 02/21/2024     Microbiology: Recent Results (from the past 240 hours)  Resp panel by RT-PCR (RSV, Flu A&B, Covid) Anterior Nasal Swab     Status: None   Collection Time: 02/21/24  5:05 PM   Specimen: Anterior Nasal Swab  Result Value Ref Range Status   SARS Coronavirus 2 by RT PCR NEGATIVE NEGATIVE Final   Influenza A by PCR NEGATIVE NEGATIVE Final   Influenza B by PCR NEGATIVE NEGATIVE Final    Comment: (NOTE) The Xpert Xpress SARS-CoV-2/FLU/RSV plus assay is intended as an aid in the diagnosis  of influenza from Nasopharyngeal swab specimens and should not be used as a sole basis for treatment. Nasal washings and aspirates are unacceptable for Xpert Xpress SARS-CoV-2/FLU/RSV testing.  Fact Sheet for Patients: BloggerCourse.com  Fact Sheet for Healthcare Providers: SeriousBroker.it  This test  is not yet approved or cleared by the [place]  FDA and has been authorized for detection and/or diagnosis of SARS-CoV-2 by FDA under an Emergency Use Authorization (EUA). This EUA will remain in effect (meaning this test can be used) for the duration of the COVID-19 declaration under Section 564(b)(1) of the Act, 21 U.S.C. section 360bbb-3(b)(1), unless the authorization is terminated or revoked.     Resp Syncytial Virus by PCR NEGATIVE NEGATIVE Final    Comment: (NOTE) Fact Sheet for Patients: BloggerCourse.com  Fact Sheet for Healthcare Providers: SeriousBroker.it  This test is not yet approved or cleared by the [place]  FDA and has been authorized for detection and/or diagnosis of SARS-CoV-2 by FDA under an Emergency Use Authorization (EUA). This EUA will remain in effect (meaning this test can be used) for the duration of the COVID-19 declaration under Section 564(b)(1) of the Act, 21 U.S.C. section 360bbb-3(b)(1), unless the authorization is terminated or revoked.  Performed at [facility], [address], [place], [place] [address]   Blood Culture (routine x 2)     Status: Abnormal (Preliminary result)   Collection Time: 02/21/24  5:17 PM   Specimen: BLOOD  Result Value Ref Range Status   Specimen Description BLOOD LEFT ANTECUBITAL  Final   Special Requests   Final    BOTTLES DRAWN AEROBIC AND ANAEROBIC Blood Culture adequate volume   Culture  Setup Time   Final    GRAM POSITIVE COCCI IN BOTH AEROBIC AND ANAEROBIC BOTTLES CRITICAL RESULT CALLED TO,  READ BACK BY AND VERIFIED WITH: PHARMD FELICIANO CLOSE [id number] AT 1054 BY EC    Culture (A)  Final    METHICILLIN RESISTANT STAPHYLOCOCCUS AUREUS Sent to [facility] for further susceptibility testing. Performed at [facility], [address], [place], [place] [address]    Report Status PENDING  Incomplete   Organism ID, Bacteria METHICILLIN RESISTANT STAPHYLOCOCCUS AUREUS  Final      Susceptibility   Methicillin resistant staphylococcus aureus - MIC*    CIPROFLOXACIN  >=8 RESISTANT Resistant     ERYTHROMYCIN >=8 RESISTANT Resistant     GENTAMICIN <=0.5 SENSITIVE Sensitive     OXACILLIN >=4 RESISTANT Resistant     TETRACYCLINE <=1 SENSITIVE Sensitive     VANCOMYCIN  <=0.5 SENSITIVE Sensitive     TRIMETH /SULFA  >=320 RESISTANT Resistant     CLINDAMYCIN <=0.25 SENSITIVE Sensitive     RIFAMPIN <=0.5 SENSITIVE Sensitive     Inducible Clindamycin NEGATIVE Sensitive     LINEZOLID  2 SENSITIVE Sensitive     * METHICILLIN RESISTANT STAPHYLOCOCCUS AUREUS  Blood Culture ID Panel (Reflexed)     Status: Abnormal   Collection Time: 02/21/24  5:17 PM  Result Value Ref Range Status   Enterococcus faecalis NOT DETECTED NOT DETECTED Final   Enterococcus Faecium NOT DETECTED NOT DETECTED Final   Listeria monocytogenes NOT DETECTED NOT DETECTED Final   Staphylococcus species DETECTED (A) NOT DETECTED Final    Comment: CRITICAL RESULT CALLED TO, READ BACK BY AND VERIFIED WITH: PHARMD FELICIANO CLOSE [id number] AT 1054 BY EC    Staphylococcus aureus (BCID) DETECTED (A) NOT DETECTED Final    Comment: Methicillin (oxacillin)-resistant Staphylococcus aureus (MRSA). MRSA is predictably resistant to beta-lactam antibiotics (except ceftaroline). Preferred therapy is vancomycin  unless clinically contraindicated. Patient requires contact precautions if  hospitalized. CRITICAL RESULT CALLED TO, READ BACK BY AND VERIFIED WITH: PHARMD FELICIANO CLOSE [id number] AT 1054 BY EC    Staphylococcus epidermidis NOT  DETECTED NOT DETECTED Final   Staphylococcus lugdunensis  NOT DETECTED NOT DETECTED Final   Streptococcus species NOT DETECTED NOT DETECTED Final   Streptococcus agalactiae NOT DETECTED NOT DETECTED Final   Streptococcus pneumoniae NOT DETECTED NOT DETECTED Final   Streptococcus pyogenes NOT DETECTED NOT DETECTED Final   A.calcoaceticus-baumannii NOT DETECTED NOT DETECTED Final   Bacteroides fragilis NOT DETECTED NOT DETECTED Final   Enterobacterales NOT DETECTED NOT DETECTED Final   Enterobacter cloacae complex NOT DETECTED NOT DETECTED Final   Escherichia coli NOT DETECTED NOT DETECTED Final   Klebsiella aerogenes NOT DETECTED NOT DETECTED Final   Klebsiella oxytoca NOT DETECTED NOT DETECTED Final   Klebsiella pneumoniae NOT DETECTED NOT DETECTED Final   Proteus species NOT DETECTED NOT DETECTED Final   Salmonella species NOT DETECTED NOT DETECTED Final   Serratia marcescens NOT DETECTED NOT DETECTED Final   Haemophilus influenzae NOT DETECTED NOT DETECTED Final   Neisseria meningitidis NOT DETECTED NOT DETECTED Final   Pseudomonas aeruginosa NOT DETECTED NOT DETECTED Final   Stenotrophomonas maltophilia NOT DETECTED NOT DETECTED Final   Candida albicans NOT DETECTED NOT DETECTED Final   Candida auris NOT DETECTED NOT DETECTED Final   Candida glabrata NOT DETECTED NOT DETECTED Final   Candida krusei NOT DETECTED NOT DETECTED Final   Candida parapsilosis NOT DETECTED NOT DETECTED Final   Candida tropicalis NOT DETECTED NOT DETECTED Final   Cryptococcus neoformans/gattii NOT DETECTED NOT DETECTED Final   Meth resistant mecA/C and MREJ DETECTED (A) NOT DETECTED Final    Comment: CRITICAL RESULT CALLED TO, READ BACK BY AND VERIFIED WITH: [facility] FELICIANO CLOSE [id number] AT 1054 BY EC Performed at [facility], [address], [place], [place] [address]   MIC (1 Drug)-     Status: None (Preliminary result)   Collection Time: 02/21/24  5:17 PM  Result Value Ref Range Status   Min  Inhibitory Conc (1 Drug) Preliminary report  Final    Comment: (NOTE) Performed At: [facility] [address] [place], [place] [id number] Jennette Shorter MD Ey:[id number]    Source DAPTOMYCIN MIC STAPH.AUREUS BLOOD CULTURE  Final    Comment: Performed at [facility], [address] [place], [place] [address]  MIC Result     Status: None   Collection Time: 02/21/24  5:17 PM  Result Value Ref Range Status   Result 1 (MIC) Staphylococcus aureus  Final    Comment: (NOTE) Identification performed by account, not confirmed by this laboratory. DAPTOMYCIN Performed At: [facility] [address] [place], [place] [id number] Jennette Shorter MD Ey:[id number]   Blood Culture (routine x 2)     Status: Abnormal   Collection Time: 02/21/24  5:31 PM   Specimen: BLOOD  Result Value Ref Range Status   Specimen Description BLOOD RIGHT ANTECUBITAL  Final   Special Requests   Final    BOTTLES DRAWN AEROBIC AND ANAEROBIC Blood Culture adequate volume   Culture  Setup Time   Final    GRAM POSITIVE COCCI ANAEROBIC BOTTLE ONLY CRITICAL VALUE NOTED.  VALUE IS CONSISTENT WITH PREVIOUSLY REPORTED AND CALLED VALUE.    Culture (A)  Final    STAPHYLOCOCCUS AUREUS SUSCEPTIBILITIES PERFORMED ON PREVIOUS CULTURE WITHIN THE LAST 5 DAYS. Performed at [facility], [address], [place], [place] [address]    Report Status 02/24/2024 FINAL  Final  Culture, blood (Routine X 2) w Reflex to ID Panel     Status: None (Preliminary result)   Collection Time: 02/23/24  8:50 PM   Specimen: BLOOD RIGHT  ARM  Result Value Ref Range Status   Specimen Description BLOOD RIGHT ARM  Final   Special Requests   Final    BOTTLES DRAWN AEROBIC AND ANAEROBIC Blood Culture adequate volume   Culture   Final    NO GROWTH 4 DAYS Performed at [facility], [address], [place], [place] [address]    Report Status PENDING  Incomplete  Culture, blood (Routine X 2) w Reflex to ID Panel      Status: None (Preliminary result)   Collection Time: 02/23/24  9:09 PM   Specimen: BLOOD LEFT HAND  Result Value Ref Range Status   Specimen Description BLOOD LEFT HAND  Final   Special Requests   Final    BOTTLES DRAWN AEROBIC AND ANAEROBIC Blood Culture adequate volume   Culture   Final    NO GROWTH 4 DAYS Performed at [facility], [address], [place], [place] [address]    Report Status PENDING  Incomplete  MRSA Next Gen by PCR, Nasal     Status: Abnormal   Collection Time: 02/25/24  8:13 AM   Specimen: Nasal Mucosa; Nasal Swab  Result Value Ref Range Status   MRSA by PCR Next Gen DETECTED (A) NOT DETECTED Final    Comment: RESULT CALLED TO, READ BACK BY AND VERIFIED WITH: RN JALESA M 1102 [id number] FCP (NOTE) The GeneXpert MRSA Assay (FDA approved for NASAL specimens only), is one component of a comprehensive MRSA colonization surveillance program. It is not intended to diagnose MRSA infection nor to guide or monitor treatment for MRSA infections. Test performance is not FDA approved in patients less than 33 years old. Performed at [facility], [address], [place], [place] [address]      Corean Fireman, MSN, NP-C [facility]  [email or web address] Pager: [phone] Office: [phone] [facility] Main Line: [phone] *Secure Chat Communication Welcome  Total Encounter Time: 25 m

## 2024-02-27 NOTE — Progress Notes (Signed)
 Inpatient Rehabilitation Admission Medication Review by a Pharmacist  A complete drug regimen review was completed for this patient to identify any potential clinically significant medication issues.  High Risk Drug Classes Is patient taking? Indication by Medication  Antipsychotic Yes Compazine -N/V  Anticoagulant Yes Apixaban -afib  Antibiotic Yes Daptomycin-MRSA bacteremia (stop date 10/27) Augmentin  PO--buttocks abscess (stop date 10/8)  Opioid Yes Oxycodone -pain  Antiplatelet No   Hypoglycemics/insulin No   Vasoactive Medication Yes Diltiazem -afib Flomax -BPH  Chemotherapy No   Other Yes Bactroban-MRSA protocol Acetaminophen -mild pain Maalox-indigestion Bisacodyl , Fleets, Senna, Miralax -constipation Benadryl --itching Guaifenesin /DM-cough Trazodone , melatonin-sleep Arfomoterol, Duoneb, prednisone --COPD Pantoprazole --GERD Lyrica - pain adjunct Atorvastatin -HLD Ferrous Gluconate -supplmentation     Type of Medication Issue Identified Description of Issue Recommendation(s)  Drug Interaction(s) (clinically significant)     Duplicate Therapy     Allergy      No Medication Administration End Date  Augmentin --Stop date 10/8 Daptomycin--stop date 10/27 Please place stop dates on antibiotics  Incorrect Dose     Additional Drug Therapy Needed     Significant med changes from prior encounter (inform family/care partners about these prior to discharge). Patient on olodaterol for COPD PTA Please resume at discharge if still appropriate  Other       Clinically significant medication issues were identified that warrant physician communication and completion of prescribed/recommended actions by midnight of the next day:  No  Name of provider notified for urgent issues identified:   Provider Method of Notification:     Pharmacist comments:   Time spent performing this drug regimen review (minutes):  1   Corayma Cashatt A. Lyle, PharmD, BCPS, FNKF Clinical Pharmacist [facility] Please utilize Amion for appropriate phone number to reach the unit pharmacist [facility])  02/27/2024 12:46 PM

## 2024-02-27 NOTE — Progress Notes (Signed)
 Emeline Joesph BROCKS, DO  Physician Physical Medicine and Rehabilitation   PMR Pre-admission    Signed   Date of Service: 02/26/2024  4:05 PM  Related encounter: ED to Hosp-Admission (Current) from 02/21/2024 in [place] [facility]   Signed     Expand All Collapse All  PMR Admission Coordinator Pre-Admission Assessment   Patient: Levi [name] is an 78 y.o., male MRN: [id number] DOB: 1946-03-13 Height: 6' 2 (188 cm) Weight: 86.2 kg   Insurance Information HMO:     PPO:      PCP:      IPA:      80/20:      OTHER:  PRIMARY: Medicare Part A and B      Policy#: [id number]      Subscriber: pt CM Name:       Phone#:      Fax#:  Pre-Cert#: verified Health and safety inspector:  Benefits:  Phone #:      Name:  Eff. Date: 03/27/12 A and B     Deduct: $1676      Out of Pocket Max: n/a      Life Max: n/a CIR: 100%      SNF: 20 full days Outpatient: 80%     Co-Pay: 20% Home Health: 100%      Co-Pay:  DME: 80%     Co-Pay: 20% Providers:  SECONDARY: BCBS      Policy#BETHA Liming [id number]      Phone#: [phone]   Financial Counselor:       Phone#:    The "Data Collection Information Summary" for patients in Inpatient Rehabilitation Facilities with attached "Privacy Act Statement-Health Care Records" was provided and verbally reviewed with: Patient and Family   Emergency Contact Information Contact Information       Name Relation Home Work Mobile    Gaston Spouse [phone]   [phone]    Delellis,Sally Daughter [phone]   [phone]         Other Contacts   None on File        Current Medical History  Patient Admitting Diagnosis: debility    History of Present Illness: Pt is a 78 y/o male with PMH of HTN, HLD, PAF, HFmrEF, depression, COPD, R BKA (no prosthetic yet), and peripheral neuropathy who was admitted to [facility] on 9/27 for c/o weakness, fever, and cough.  Temp with EMS was 100.4, SBP 90s, noted to have stage 1 sacral wound and skin tear to  left thigh, non healing wound to medial aspect of R residual limb, and mild swelling of L hallux.  Recently completed abx course for URI symptoms.  In ED WBC 13.7, hgb 9.2 down from 10.8 on previous labs, potassium 3, mag 1.6, creatinine 1.1, calcium  8.2, albumin 2.1.  Viral panel negative.  Chest xray showed left lower lobe consolidation concerning for PNA.  Blood cultures positive for MRSA and ID consulted.  Chest CT on 9/29 showed increasing atelectasis or infiltrate of the lung bases with trace right pleural effusion and small left pleural effusion.  ID recommended daptomyocin x4 weeks.  Added augmentin  on 9/30 for concern for abscess in left buttock.  TTE and TEE negative for vegetations.  Hospital course AKI, ongoing hypokalemia, wound care.  Therapy ongoing and recommendations are for CIR.    Patient's medical record from [name] has been reviewed by the rehabilitation admission coordinator and physician.   Past Medical History      Past  Medical History:  Diagnosis Date   Agent orange exposure 1970's    takes Imdur ,Diltiazem , and Atacand  daily   Agent orange exposure     ALLERGIC RHINITIS     Allergy       seasonal allergies   Anemia      iron  deficiency   Arthritis     Asthma      uses inhaler and nebulizers   Atrial fibrillation (HCC)     Bruises easily      d/t prednisone  daily   Cataract     Cellulitis 03/22/2020   Chest wall pain 09/21/2019    L scapula, reported 4/27 21/     CHF (congestive heart failure) (HCC)      dx-on meds to treat   Chronic bronchitis (HCC)      used to get it q yr; last time ?2008 (04/28/2012)   Clotting disorder 2019    On blood thinner when I bleed it is for a consideral amount of time. This is tegardless of size of wound.   COPD (chronic obstructive pulmonary disease) (HCC)      agent orange exposure   Degenerative disk disease      everywhere (04/28/2012)   Depression due to physical illness 12/15/2023   Diverticulitis     Dysrhythmia       afib   Elevated uric acid in blood      takes Allopurinol  daily   Emphysema of lung (HCC)     Enlarged prostate      but not on any meds   Essential (primary) hypertension 11/15/2020   GERD (gastroesophageal reflux disease)      takes Protonix  daily   H/O hiatal hernia     Headache     History of colonic polyps 02/09/2013    01/2013 - 3 adenomas max 8 mm  01/2015 2 diminutive adenomas - repeat colon 2021     History of colonoscopy     History of pneumonia      a. 2010   History of total right hip arthroplasty 07/31/2015   Hyperlipidemia      takes Lipitor daily; pt. states he takes a preventive   Hypothyroidism     Joint pain     Joint swelling     Leukocytosis     Lumbar stenosis status post T12-S1 fusion 04/07/2017   MSSA bacteremia 03/19/2023   Nausea and vomiting 11/12/2022   Neuromuscular disorder (HCC)      bilat legs, and bilat arms   Neuropathy     Osteoarthritis of left knee 01/10/2012   Osteoporosis 2012   PAF (paroxysmal atrial fibrillation) (HCC)      a. Dx 04/2012, CHA2DS2VASc = 1 (age);  b. 04/2012 Echo: EF 40-50%, mild MR.   Personal history of colonic adenomas 02/09/2013   Pneumonia     Primary osteoarthritis of right knee 06/17/2014   Sepsis (HCC) 03/18/2023   Shortness of breath dyspnea     Thyroid  disease      no taking meds-caused dizziness          Has the patient had major surgery during 100 days prior to admission? No   Family History   family history includes Asthma in his brother; COPD in his brother; Cancer in his brother and father; Colon cancer in his brother and another family member; Colon polyps in his brother and brother; Diabetes in his brother and mother; Heart disease in his father; Hyperlipidemia in his father; Prostate cancer in his brother; Stroke  in his father.   Current Medications  Current Medications    Current Facility-Administered Medications:    acetaminophen  (TYLENOL ) tablet 650 mg, 650 mg, Oral, Q6H PRN **OR**  acetaminophen  (TYLENOL ) suppository 650 mg, 650 mg, Rectal, Q6H PRN, Jeffrie Oneil BROCKS, MD   amoxicillin -clavulanate (AUGMENTIN ) 875-125 MG per tablet 1 tablet, 1 tablet, Oral, Q12H, Jeffrie Oneil BROCKS, MD, 1 tablet at 02/26/24 9047   apixaban  (ELIQUIS ) tablet 5 mg, 5 mg, Oral, BID, Jeffrie Oneil BROCKS, MD, 5 mg at 02/26/24 9047   arformoterol  (BROVANA ) nebulizer solution 15 mcg, 15 mcg, Nebulization, BID, Jeffrie Oneil BROCKS, MD, 15 mcg at 02/26/24 9157   atorvastatin  (LIPITOR) tablet 20 mg, 20 mg, Oral, Daily, Jeffrie Oneil C, MD, 20 mg at 02/26/24 9047   Chlorhexidine  Gluconate Cloth 2 % PADS 6 each, 6 each, Topical, Daily, Skains, Mark C, MD, 6 each at 02/26/24 1105   DAPTOmycin (CUBICIN) IVPB 700 mg/100mL premix, 8 mg/kg, Intravenous, Q1400, Jeffrie Oneil BROCKS, MD, Last Rate: 200 mL/hr at 02/26/24 1300, 700 mg at 02/26/24 1300   diltiazem  (CARDIZEM  CD) 24 hr capsule 180 mg, 180 mg, Oral, Daily, Skains, Mark C, MD, 180 mg at 02/26/24 0950   ferrous gluconate  (FERGON) tablet 324 mg, 324 mg, Oral, Daily, Skains, Mark C, MD, 324 mg at 02/26/24 0951   guaiFENesin -dextromethorphan (ROBITUSSIN DM) 100-10 MG/5ML syrup 5 mL, 5 mL, Oral, Q4H PRN, Skains, Mark C, MD   ipratropium-albuterol  (DUONEB) 0.5-2.5 (3) MG/3ML nebulizer solution 3 mL, 3 mL, Nebulization, Q6H PRN, Jeffrie Oneil C, MD, 3 mL at 02/26/24 9157   LORazepam  (ATIVAN ) injection 0.5 mg, 0.5 mg, Intravenous, Q6H PRN, Jeffrie Oneil BROCKS, MD   melatonin tablet 5 mg, 5 mg, Oral, QHS, Skains, Mark C, MD, 5 mg at 02/25/24 2120   mupirocin ointment (BACTROBAN) 2 % 1 Application, 1 Application, Nasal, BID, Cindy Garnette POUR, MD, 1 Application at 02/26/24 [phone]   oxyCODONE  (Oxy IR/ROXICODONE ) immediate release tablet 5 mg, 5 mg, Oral, Q4H PRN, Jeffrie Oneil BROCKS, MD, 5 mg at 02/24/24 0434   pantoprazole  (PROTONIX ) EC tablet 40 mg, 40 mg, Oral, BID AC, Skains, Mark C, MD, 40 mg at 02/26/24 9047   polyethylene glycol (MIRALAX  / GLYCOLAX ) packet 17 g, 17 g, Oral, BID, Cindy Garnette POUR,  MD, 17 g at 02/26/24 1259   predniSONE  (DELTASONE ) tablet 20 mg, 20 mg, Oral, Q breakfast, Skains, Mark C, MD, 20 mg at 02/26/24 9048   pregabalin  (LYRICA ) capsule 150 mg, 150 mg, Oral, Daily, Skains, Mark C, MD, 150 mg at 02/26/24 9048   pregabalin  (LYRICA ) capsule 75 mg, 75 mg, Oral, QPM, Skains, Mark C, MD, 75 mg at 02/25/24 1754   senna (SENOKOT) tablet 8.6 mg, 1 tablet, Oral, Daily PRN, Cindy Garnette POUR, MD   tamsulosin  (FLOMAX ) capsule 0.4 mg, 0.4 mg, Oral, QHS, Jeffrie Oneil BROCKS, MD, 0.4 mg at 02/25/24 2120     Patients Current Diet:  Diet Order                  Diet regular Fluid consistency: Thin  Diet effective now                         Precautions / Restrictions Precautions Precautions: Fall Precaution/Restrictions Comments: R BKA Restrictions Weight Bearing Restrictions Per Provider Order: No Other Position/Activity Restrictions: chronic R BKA, does not have prosthetic yet but was supposed to pick up from [facility] this week    Has the patient had 2 or  more falls or a fall with injury in the past year? No   Prior Activity Level Household: able to transfer to manual w/c with slideboard mod I, some minimal assist for ADLs prior to recent decline   Prior Functional Level Self Care: Did the patient need help bathing, dressing, using the toilet or eating? Needed some help   Indoor Mobility: Did the patient need assistance with walking from room to room (with or without device)? Dependent   Stairs: Did the patient need assistance with internal or external stairs (with or without device)? Dependent   Functional Cognition: Did the patient need help planning regular tasks such as shopping or remembering to take medications? Independent   Patient Information Are you of Hispanic, Latino/a,or Spanish origin?: A. No, not of Hispanic, Latino/a, or Spanish origin What is your race?: A. White Do you need or want an interpreter to communicate with a doctor or health care staff?:  0. No   Patient's Response To:  Health Literacy and Transportation Is the patient able to respond to health literacy and transportation needs?: Yes Health Literacy - How often do you need to have someone help you when you read instructions, pamphlets, or other written material from your doctor or pharmacy?: Never In the past 12 months, has lack of transportation kept you from medical appointments or from getting medications?: No In the past 12 months, has lack of transportation kept you from meetings, work, or from getting things needed for daily living?: No   Journalist, newspaper / Equipment Home Equipment: Shower seat, BSC/3in1, Agricultural consultant (2 wheels), [employer] - single point, Rollator (4 wheels), Wheelchair - manual, Toilet riser   Prior Device Use: Indicate devices/aids used by the patient prior to current illness, exacerbation or injury? Manual wheelchair and slideboard   Current Functional Level Cognition   Orientation Level: Oriented X4    Extremity Assessment (includes Sensation/Coordination)   Upper Extremity Assessment: Generalized weakness, RUE deficits/detail RUE Deficits / Details: hx R RTC tear ~15* shoulder flexion RUE Coordination: decreased gross motor  Lower Extremity Assessment: Defer to PT evaluation RLE Deficits / Details: chronic R BKA, ROM at knee and hip WFL     ADLs   Overall ADL's : Needs assistance/impaired Eating/Feeding: Set up, Sitting Grooming: Set up, Sitting Upper Body Bathing: Minimal assistance, Sitting Lower Body Bathing: Sitting/lateral leans, Moderate assistance Upper Body Dressing : Moderate assistance, Sitting Lower Body Dressing: Moderate assistance, Sitting/lateral leans Toilet Transfer: Maximal assistance Toileting- Clothing Manipulation and Hygiene: Total assistance Functional mobility during ADLs: Total assistance     Mobility   Overal bed mobility: Needs Assistance Bed Mobility: Supine to Sit, Sit to Supine Supine to sit: HOB  elevated, Used rails, Mod assist Sit to supine: Mod assist General bed mobility comments: minA to manage LE, then modA to elevate trunk and use bed pad to scoot pt hips to EOB     Transfers   Overall transfer level: Needs assistance Equipment used: Rolling walker (2 wheels) Transfers: Bed to chair/wheelchair/BSC, Sit to/from Stand Sit to Stand: Mod assist, +2 physical assistance, From elevated surface Bed to/from chair/wheelchair/BSC transfer type:: Lateral/scoot transfer  Lateral/Scoot Transfers: Contact guard assist General transfer comment: pt completed hip lift from EOB with BUE support and LLE x 5 at start of session with good clearance, then completed x6 sit-stand from EOB with modA of 2 and elevated bed. BUE support on RW and cues to maintain RW on ground, extend at hips, and maintain upright trunk, up to 15 sec  standing tolerance     Ambulation / Gait / Stairs / Wheelchair Mobility   Ambulation/Gait General Gait Details: pt unable to advance LLE     Posture / Balance Dynamic Sitting Balance Sitting balance - Comments: right lateral lean Balance Overall balance assessment: Needs assistance Sitting-balance support: No upper extremity supported, Feet supported Sitting balance-[name] Scale: Poor Sitting balance - Comments: right lateral lean Postural control: Right lateral lean, Posterior lean Standing balance support: Bilateral upper extremity supported, During functional activity Standing balance-[name] Scale: Poor Standing balance comment: posterior lean despite modA and BUE support     Special considerations/life events  Continuous Drip IV  daptomycin , Skin stage 1 to sacrum, skin tears to left buttock, skin tear to left thigh/L shin/L lateral lower leg, and Diabetic management yes    Previous Home Environment (from acute therapy documentation) Living Arrangements: Spouse/significant other Available Help at Discharge: Family, Available 24 hours/day Type of Home: House Home  Layout: Able to live on main level with bedroom/bathroom Home Access: Ramped entrance Bathroom Shower/Tub: Health visitor: Handicapped height Bathroom Accessibility: Yes Home Care Services: Yes Type of Home Care Services: Home OT, Home PT, Home RN Home Care Agency (if known): [facility] Additional Comments: pt reports he was supposed to get a prosthetic for RLE next week   Discharge Living Setting Plans for Discharge Living Setting: Patient's home, Lives with (comment) (spouse, [name]) Type of Home at Discharge: House Discharge Home Layout: Able to live on main level with bedroom/bathroom Discharge Home Access: Ramped entrance Discharge Bathroom Shower/Tub: Walk-in shower Discharge Bathroom Toilet: Handicapped height Discharge Bathroom Accessibility: Yes How Accessible: Accessible via wheelchair Does the patient have any problems obtaining your medications?: No   Social/Family/Support Systems Patient Roles: Spouse Anticipated Caregiver: [name] Anticipated Caregiver's Contact Information: [phone] Ability/Limitations of Caregiver: can provide some physical assist for transfers and ADLs if needed Caregiver Availability: 24/7 Discharge Plan Discussed with Primary Caregiver: Yes Is Caregiver In Agreement with Plan?: Yes   Goals Patient/Family Goal for Rehab: PT/OT min assist to mod I, SLP n/a Expected length of stay: 14-16 days Additional Information: Discharge plan: home with spouse who can provide 24/7 care support Pt/Family Agrees to Admission and willing to participate: Yes Program Orientation Provided & Reviewed with Pt/Caregiver Including Roles  & Responsibilities: Yes   Decrease burden of Care through IP rehab admission: n/a   Possible need for SNF placement upon discharge:  Not anticipated.  Plan for discharge home with family to provide 24/7 support   Patient Condition: I have reviewed medical records from [facility], spoken with [facility] team, and  patient, spouse, and daughter. I met with patient at the bedside for inpatient rehabilitation assessment.  Patient will benefit from ongoing PT and OT, can actively participate in 3 hours of therapy a day 5 days of the week, and can make measurable gains during the admission.  Patient will also benefit from the coordinated team approach during an Inpatient Acute Rehabilitation admission.  The patient will receive intensive therapy as well as Rehabilitation physician, nursing, social worker, and care management interventions.  Due to bladder management, bowel management, safety, skin/wound care, disease management, medication administration, pain management, and patient education the patient requires 24 hour a day rehabilitation nursing.  The patient is currently max assist with sit<>stand mobility and basic ADLs.  Discharge setting and therapy post discharge at home with home health is anticipated.  Patient has agreed to participate in the Acute Inpatient Rehabilitation Program and will admit today.  Preadmission Screen Completed By:  Reche FORBES Lowers, 02/26/2024 4:05 PM ______________________________________________________________________   Discussed status with Dr. Emeline on 02/27/24  at 10:31 AM  and received approval for admission today.   Admission Coordinator:  Makyle Eslick E Channon Ambrosini, PT, time 10:31 AM Pattricia 02/27/24     Assessment/Plan: Diagnosis: Debility secondary to MRSA bacteremia d/t pneumonia Does the need for close, 24 hr/day Medical supervision in concert with the patient's rehab needs make it unreasonable for this patient to be served in a less intensive setting? Yes Co-Morbidities requiring supervision/potential complications: AKI, COPD, R rotator cuff injury, electrolyte deficiencies, CHF, A fib, anemia, pressure wounds, urinary retention / BPH, and peripheral neuropathy Due to bladder management, safety, skin/wound care, disease management, medication administration, pain management, and  patient education, does the patient require 24 hr/day rehab nursing? Yes Does the patient require coordinated care of a physician, rehab nurse, PT, OT to address physical and functional deficits in the context of the above medical diagnosis(es)? Yes Addressing deficits in the following areas: balance, endurance, locomotion, strength, transferring, bathing, dressing, feeding, grooming, and toileting Can the patient actively participate in an intensive therapy program of at least 3 hrs of therapy 5 days a week? Yes The potential for patient to make measurable gains while on inpatient rehab is good Anticipated functional outcomes upon discharge from inpatient rehab: supervision PT, supervision OT Estimated rehab length of stay to reach the above functional goals is: 14-16 days Anticipated discharge destination: Home 10. Overall Rehab/Functional Prognosis: good     MD Signature:   Joesph JAYSON Emeline, DO 02/27/2024           Revision History

## 2024-02-27 NOTE — H&P (Shared)
 Physical Medicine and Rehabilitation Admission H&P    Chief Complaint  Patient presents with   Functional deficits due to debility   S/p R-BKA and Right RTC arthropathy     HPI:  Levi [name] is a 78 year old male with history of HTN, PAF- on eliquis , GERD, HFmrEF (Dr. COPD- steroid dependent, right foot osteomyelitis s/p R-BKA (CIR 6/22-7/11/25), chronic sacral decub, peripheral neuropathy with recent falls, recent admission for PNA 08/25 as well as issues with fall with right hip pain/mild concussion, R-shoulder pain  who was admitted on 02/21/24 with generalized weakness with malaise and poor intake, fevers, non healing wound right medial stump and reports of recent URI with ongoing cough despite course of antibiotics. He was noted have soft BP, lactic acidosis and LLL consolidation concerning for sepsis due to PNA. Foley was placed due to concerns of retention?  He was started on IVF, broad spectrum antibiotics and BC done revealing MRSA.  Dr. Overton consulted and recommended transitioning to Daptomycin as well as TEE which revealing EF 50-55% with mild MVR, AVS without stenosis and negative for shunt, thrombus or endocarditis. He was noted to have left buttock pian with erythema and CT pelvis ordered which showed diffuse SQ edema with induration soft tissue left buttock , fractures of superior and inferior pubic rami subacute or chronic with extensive demineralization but no gas, fluid collection or abscess. Augmentin  added by Dr. Evelynn due to concerns of possible abscess formation. Foley remains in place. PT/OT consulted and patient requires mod assist with transfers. CIR recommended due to functional decline.    Review of Systems  Constitutional:  Negative for fever.  Eyes:  Positive for double vision (occasionally).  Respiratory:  Negative for cough and shortness of breath.   Cardiovascular:  Negative for chest pain, orthopnea and leg swelling.  Gastrointestinal:  Positive for  constipation. Negative for abdominal pain and heartburn.  Musculoskeletal:  Positive for joint pain (right shoulder pain) and myalgias.  Skin:  Negative for rash.  Neurological:  Positive for sensory change (LLE numbness) and weakness. Negative for dizziness and headaches.  Psychiatric/Behavioral:  The patient is not nervous/anxious and does not have insomnia.     Past Medical History:  Diagnosis Date   Agent orange exposure 1970's   takes Imdur ,Diltiazem , and Atacand  daily   Agent orange exposure    ALLERGIC RHINITIS    Allergy     seasonal allergies   Anemia    iron  deficiency   Arthritis    Asthma    uses inhaler and nebulizers   Atrial fibrillation (HCC)    Bruises easily    d/t prednisone  daily   Cataract    Cellulitis 03/22/2020   Chest wall pain 09/21/2019   L scapula, reported 4/27 21/     CHF (congestive heart failure) (HCC)    dx-on meds to treat   Chronic bronchitis (HCC)    used to get it q yr; last time ?2008 (04/28/2012)   Clotting disorder 2019   On blood thinner when I bleed it is for a consideral amount of time. This is tegardless of size of wound.   COPD (chronic obstructive pulmonary disease) (HCC)    agent orange exposure   Degenerative disk disease    everywhere (04/28/2012)   Depression due to physical illness 12/15/2023   Diverticulitis    Dysrhythmia    afib   Elevated uric acid in blood    takes Allopurinol  daily   Emphysema of lung (HCC)  Enlarged prostate    but not on any meds   Essential (primary) hypertension 11/15/2020   GERD (gastroesophageal reflux disease)    takes Protonix  daily   H/O hiatal hernia    Headache    History of colonic polyps 02/09/2013   01/2013 - 3 adenomas max 8 mm  01/2015 2 diminutive adenomas - repeat colon 2021     History of colonoscopy    History of pneumonia    a. 2010   History of total right hip arthroplasty 07/31/2015   Hyperlipidemia    takes Lipitor daily; pt. states he takes a preventive    Hypothyroidism    Joint pain    Joint swelling    Leukocytosis    Lumbar stenosis status post T12-S1 fusion 04/07/2017   MSSA bacteremia 03/19/2023   Nausea and vomiting 11/12/2022   Neuromuscular disorder (HCC)    bilat legs, and bilat arms   Neuropathy    Osteoarthritis of left knee 01/10/2012   Osteoporosis 2012   PAF (paroxysmal atrial fibrillation) (HCC)    a. Dx 04/2012, CHA2DS2VASc = 1 (age);  b. 04/2012 Echo: EF 40-50%, mild MR.   Personal history of colonic adenomas 02/09/2013   Pneumonia    Primary osteoarthritis of right knee 06/17/2014   Sepsis (HCC) 03/18/2023   Shortness of breath dyspnea    Thyroid  disease    no taking meds-caused dizziness    Past Surgical History:  Procedure Laterality Date   64 HOUR PH STUDY N/A 05/30/2021   Procedure: 24 HOUR PH STUDY;  Surgeon: San Sandor GAILS, DO;  Location: [facility];  Service: Gastroenterology;  Laterality: N/A;   AMPUTATION Right 11/14/2023   Procedure: AMPUTATION BELOW KNEE;  Surgeon: Harden Jerona GAILS, MD;  Location: [facility] OR;  Service: Orthopedics;  Laterality: Right;   AMPUTATION TOE Bilateral 08/13/2023   Procedure: LEFT SECOND TOE AMPUTATION AND RIGHT FIFTH TOE AMPUTATION;  Surgeon: Malvin Marsa FALCON, DPM;  Location: [facility] OR;  Service: Orthopedics/Podiatry;  Laterality: Bilateral;   ANTERIOR CERVICAL DECOMP/DISCECTOMY FUSION N/A 07/21/2012   Procedure: ANTERIOR CERVICAL DECOMPRESSION/DISCECTOMY FUSION 2 LEVELS;  Surgeon: Victory Gens, MD;  Location: [facility];  Service: Neurosurgery;  Laterality: N/A;  C4-5 C5-6 Anterior cervical decompression/diskectomy/fusion   ANTERIOR LAT LUMBAR FUSION N/A 04/07/2017   Procedure: Thoracic twelve-Lumbar one Anterolateral decompression/fusion;  Surgeon: Gens Victory, MD;  Location: [facility] OR;  Service: Neurosurgery;  Laterality: N/A;   ARTHRODESIS FOOT WITH WEIL OSTEOTOMY Left 11/07/2022   Procedure: LEFT THIRD METATARSOPHALANGEAL CAPSULOTOMY, WEIL OSTEOTOMY;  Surgeon: Elsa Lonni SAUNDERS, MD;  Location: [facility] OR;  Service: Orthopedics;  Laterality: Left;  LENGTH OF SURGERY: 60 MINUTES   BACK SURGERY     x 3   BELOW KNEE LEG AMPUTATION Right    June 2025   BONE EXOSTOSIS EXCISION Right 10/16/2023   Procedure: EXCISION, EXOSTOSIS;  Surgeon: Janit Thresa HERO, DPM;  Location: [facility];  Service: Orthopedics/Podiatry;  Laterality: Right;   CARDIAC CATHETERIZATION  11/2008   Dr. [place] - 20% calcified non flow limiting left main, 50% EF apical hypokinesis   CARPAL TUNNEL RELEASE Bilateral    CERVICAL DISC ARTHROPLASTY N/A 04/07/2017   Procedure: Cervical six-seven Disc arthroplasty;  Surgeon: Gens Victory, MD;  Location: [facility] OR;  Service: Neurosurgery;  Laterality: N/A;   CHEST TUBE INSERTION Left 04/11/2017   Procedure: CHEST TUBE INSERTION;  Surgeon: Fleeta Hanford Coy, MD;  Location: [facility] OR;  Service: Thoracic;  Laterality: Left;   CHOLECYSTECTOMY     COLONOSCOPY  2016  CG-MAC-miralax (good)servere TICS/TA x 2   ESOPHAGEAL MANOMETRY N/A 05/30/2021   Procedure: ESOPHAGEAL MANOMETRY (EM);  Surgeon: San Sandor GAILS, DO;  Location: [facility];  Service: Gastroenterology;  Laterality: N/A;  ph impedence   ESOPHAGOGASTRODUODENOSCOPY     EYE SURGERY Bilateral 2011   steroidal encapsulation (04/28/2012)   FOOT SURGERY Right    x 2   JOINT REPLACEMENT     Many surgeries including 5 joit replacements   KNEE ARTHROSCOPY Bilateral    LATERAL / POSTERIOR COMBINED FUSION LUMBAR SPINE  2012   LEFT ATRIAL APPENDAGE OCCLUSION N/A 02/22/2021   Procedure: LEFT ATRIAL APPENDAGE OCCLUSION;  Surgeon: Cindie Ole DASEN, MD;  [facility];  Service: Cardiovascular;  Laterality: N/A;   NEUROPLASTY / TRANSPOSITION ULNAR NERVE AT ELBOW Bilateral    POLYPECTOMY  2016   severe TICS/TA x 2   POSTERIOR CERVICAL LAMINECTOMY WITH MET- RX Right 06/11/2021   Procedure: Right Cervical six-seven  Laminectomy and foraminotomy with metrex;  Surgeon: Colon Shove, MD;  Location: [facility]  OR;  Service: Neurosurgery;  Laterality: Right;   REFRACTIVE SURGERY Left 07/07/2023   laser surgery but still has floaters   REVERSE SHOULDER ARTHROPLASTY  04/28/2012   Procedure: REVERSE SHOULDER ARTHROPLASTY;  Surgeon: Eva Elsie Herring, MD;  Location: [facility] OR;  Service: Orthopedics;  Laterality: Left;  Left shouder reverse total shoulder arthroplasty   SHOULDER SURGERY     SPINE SURGERY     I have had 7 spinal surgeries   TEE WITHOUT CARDIOVERSION N/A 02/22/2021   Procedure: TRANSESOPHAGEAL ECHOCARDIOGRAM (TEE);  Surgeon: Cindie Ole DASEN, MD;  Location: [facility] INVASIVE CV LAB;  Service: Cardiovascular;  Laterality: N/A;   TENDON REPAIR Right 08/07/2020   Procedure: RIGHT HAND LIGAMENT RECONSTRUCTION AND TENDON INTERPOSITION;  Surgeon: Yvone Rush, MD;  Location: [facility];  Service: Orthopedics;  Laterality: Right;   TONSILLECTOMY AND ADENOIDECTOMY  1954   TOTAL HIP ARTHROPLASTY Left 2011   left (04/28/2012)   TOTAL HIP ARTHROPLASTY Right 07/31/2015   Procedure: TOTAL HIP ARTHROPLASTY ANTERIOR APPROACH;  Surgeon: Rush Yvone, MD;  [facility];  Service: Orthopedics;  Laterality: Right;   TOTAL KNEE ARTHROPLASTY  01/10/2012   Procedure: TOTAL KNEE ARTHROPLASTY;  Surgeon: Rush LITTIE Yvone, MD;  Location: [facility];  Service: Orthopedics;;  left total knee arthroplasty   TOTAL KNEE ARTHROPLASTY Right 06/17/2014   Procedure: TOTAL KNEE ARTHROPLASTY;  Surgeon: Rush LITTIE Yvone, MD;  Location: [facility] OR;  Service: Orthopedics;  Laterality: Right;   toupet fundolplication  11/01/2021   TRANSESOPHAGEAL ECHOCARDIOGRAM (CATH LAB) N/A 03/24/2023   Procedure: TRANSESOPHAGEAL ECHOCARDIOGRAM;  Surgeon: Mona Vinie BROCKS, MD;  [facility];  Service: Cardiovascular;  Laterality: N/A;   TRANSESOPHAGEAL ECHOCARDIOGRAM (CATH LAB) N/A 02/25/2024   Procedure: TRANSESOPHAGEAL ECHOCARDIOGRAM;  Surgeon: [place] Oneil BROCKS, MD;  [facility]: [facility];  Service: Cardiovascular;   Laterality: N/A;    Family History  Problem Relation Age of Onset   Diabetes Mother    Heart disease Father        Died 66, MI   Cancer Father    Hyperlipidemia Father    Stroke Father    Prostate cancer Brother    Colon polyps Brother    Diabetes Brother    Colon polyps Brother    Asthma Brother    COPD Brother    Colon cancer Brother        Dx age 66   Cancer Brother    Colon cancer Other  Esophageal cancer Neg Hx    Rectal cancer Neg Hx    Stomach cancer Neg Hx    Pancreatic cancer Neg Hx    Kidney disease Neg Hx    Liver disease Neg Hx    Neuropathy Neg Hx     Social History:  reports that he has never smoked. He has never used smokeless tobacco. He reports that he does not drink alcohol and does not use drugs.   Allergies  Allergen Reactions   Levaquin  [Levofloxacin ] Itching    Tolerated ciprofloxacin  in 2022.   Medications Prior to Admission  Medication Sig Dispense Refill   albuterol  (VENTOLIN  HFA) 108 (90 Base) MCG/ACT inhaler INHALE 2 PUFFS BY MOUTH EVERY 4 HOURS AS NEEDED FOR WHEEZE OR FOR SHORTNESS OF BREATH 8 each 4   apixaban  (ELIQUIS ) 5 MG TABS tablet Take 1 tablet (5 mg total) by mouth 2 (two) times daily. 180 tablet 1   atorvastatin  (LIPITOR) 20 MG tablet Take 1 tablet (20 mg total) by mouth daily. 90 tablet 3   cyclobenzaprine  (FLEXERIL ) 10 MG tablet TAKE 1 TABLET BY MOUTH THREE TIMES A DAY AS NEEDED FOR MUSCLE SPASMS 90 tablet 3   diltiazem  (CARDIZEM  CD) 180 MG 24 hr capsule Take 1 capsule (180 mg total) by mouth daily. 90 capsule 3   diltiazem  (CARDIZEM ) 30 MG tablet Take 30 mg by mouth daily as needed (for high HR).     ferrous gluconate  (FERGON) 324 MG tablet Take 1 tablet (324 mg total) by mouth daily. 30 tablet 0   ipratropium-albuterol  (DUONEB) 0.5-2.5 (3) MG/3ML SOLN 1 NEBULE EVERY 6 HOURS AS NEEDED. **J45.3** 270 mL 4   isosorbide  mononitrate (IMDUR ) 30 MG 24 hr tablet Take 1 tablet (30 mg total) by mouth daily. 90 tablet 3   Olodaterol HCl  (STRIVERDI RESPIMAT) 2.5 MCG/ACT AERS INHALE 2 PUFFS BY MOUTH INTO THE LUNGS DAILY 4 g 11   pantoprazole  (PROTONIX ) 40 MG tablet Take 1 tablet (40 mg total) by mouth 2 (two) times daily before a meal. 60 tablet 0   predniSONE  (DELTASONE ) 5 MG tablet 3 tabs daily (Patient taking differently: Take 20 mg by mouth daily with breakfast. 3 tabs daily) 100 tablet 5   pregabalin  (LYRICA ) 150 MG capsule Take 1 capsule (150 mg total) by mouth 2 (two) times daily. (Patient taking differently: Take 75-150 mg by mouth See admin instructions. Takes 150 mg in the morning and 75 in the evening) 60 capsule 2   tamsulosin  (FLOMAX ) 0.4 MG CAPS capsule Take 1 capsule (0.4 mg total) by mouth at bedtime. 30 capsule 0     Home: Home Living Family/patient expects to be discharged to:: Private residence Living Arrangements: Spouse/significant other Available Help at Discharge: Family, Available 24 hours/day Type of Home: House Home Access: Ramped entrance Home Layout: Able to live on main level with bedroom/bathroom Bathroom Shower/Tub: Health visitor: Handicapped height Bathroom Accessibility: Yes Home Equipment: Information systems manager, BSC/3in1, Agricultural consultant (2 wheels), Cane - single point, Rollator (4 wheels), Wheelchair - manual, Toilet riser Additional Comments: pt reports he was supposed to get a prosthetic for RLE next week   Functional History: Prior Function Prior Level of Function : Needs assist Mobility Comments: uses slide board to transfer to w/c, ADLs Comments: wife assists PRN for ADLs  Functional Status:  Mobility: Bed Mobility Overal bed mobility: Needs Assistance Bed Mobility: Rolling, Supine to Sit, Sit to Supine Rolling: Mod assist Supine to sit: Mod assist, Max assist Sit to supine: Mod  assist General bed mobility comments: up/down via L elbow but without full rolling and heavy mod.  Worked on rolling from a hook-lying position with intent on building momentum, but did not add  to full transition to EOB yet. Transfers Overall transfer level: Needs assistance Equipment used: Rolling walker (2 wheels) Transfers: Sit to/from Stand Sit to Stand: Mod assist, From elevated surface (x5) Bed to/from chair/wheelchair/BSC transfer type:: Lateral/scoot transfer  Lateral/Scoot Transfers: Mod assist, Contact guard assist (mod initially to feel the movement and worked to [employer] for small scoot up 2 feet in bed.) General transfer comment: pt completed hip lift from EOB with BUE support and LLE x 5 at start of session with good clearance, then completed x6 sit-stand from EOB with modA of 2 and elevated bed. BUE support on RW and cues to maintain RW on ground, extend at hips, and maintain upright trunk, up to 15 sec standing tolerance Ambulation/Gait General Gait Details: pt unable to advance LLE    ADL: ADL Overall ADL's : Needs assistance/impaired Eating/Feeding: Set up, Sitting Grooming: Set up, Sitting Upper Body Bathing: Minimal assistance, Sitting Lower Body Bathing: Sitting/lateral leans, Moderate assistance Upper Body Dressing : Moderate assistance, Sitting Lower Body Dressing: Moderate assistance, Sitting/lateral leans Toilet Transfer: Maximal assistance Toileting- Clothing Manipulation and Hygiene: Total assistance Functional mobility during ADLs: Total assistance  Cognition: Cognition Orientation Level: Oriented X4 Cognition Arousal: Alert Behavior During Therapy: WFL for tasks assessed/performed   Blood pressure 110/75, pulse 92, temperature 98.1 F (36.7 C), temperature source Oral, resp. rate 18, height 6' 2 (1.88 m), weight 86.2 kg, SpO2 93%. Physical Exam Vitals and nursing note reviewed.  Constitutional:      Appearance: Normal appearance.  Skin:    Comments: Papery skin with diffuse ecchymotic and flaking BUE. RUE with evidence of third spacing and dependent edema around elbow. LLE with multiple scabs covered with foam dressing. R-BKA intact  without breakdown and scared area medially. Pressure area with scab on tibial prominence.   Neurological:     Mental Status: He is alert.     Results for orders placed or performed during the hospital encounter of 02/21/24 (from the past 48 hours)  CBC     Status: Abnormal   Collection Time: 02/26/24  4:55 AM  Result Value Ref Range   WBC 12.6 (H) 4.0 - 10.5 K/uL   RBC 3.54 (L) 4.22 - 5.81 MIL/uL   Hemoglobin 10.0 (L) 13.0 - 17.0 g/dL   HCT 68.1 (L) 60.9 - 47.9 %   MCV 89.8 80.0 - 100.0 fL   MCH 28.2 26.0 - 34.0 pg   MCHC 31.4 30.0 - 36.0 g/dL   RDW 82.6 (H) 88.4 - 84.4 %   Platelets 217 150 - 400 K/uL   nRBC 0.2 0.0 - 0.2 %    Comment: Performed at [facility], [address], [place], [place] [address]  Basic metabolic panel with GFR     Status: Abnormal   Collection Time: 02/26/24  4:55 AM  Result Value Ref Range   Sodium 142 135 - 145 mmol/L   Potassium 3.9 3.5 - 5.1 mmol/L   Chloride 108 98 - 111 mmol/L   CO2 25 22 - 32 mmol/L   Glucose, Bld 72 70 - 99 mg/dL    Comment: Glucose reference range applies only to samples taken after fasting for at least 8 hours.   BUN 12 8 - 23 mg/dL   Creatinine, Ser 9.41 (L) 0.61 - 1.24 mg/dL  Calcium  8.3 (L) 8.9 - 10.3 mg/dL   GFR, Estimated >39 >39 mL/min    Comment: (NOTE) Calculated using the CKD-EPI Creatinine Equation (2021)    Anion gap 9 5 - 15    Comment: Performed at [facility], [address] [place], [place] [address]  CBC     Status: Abnormal   Collection Time: 02/27/24  5:09 AM  Result Value Ref Range   WBC 11.8 (H) 4.0 - 10.5 K/uL   RBC 3.51 (L) 4.22 - 5.81 MIL/uL   Hemoglobin 10.0 (L) 13.0 - 17.0 g/dL   HCT 68.9 (L) 60.9 - 47.9 %   MCV 88.3 80.0 - 100.0 fL   MCH 28.5 26.0 - 34.0 pg   MCHC 32.3 30.0 - 36.0 g/dL   RDW 82.5 (H) 88.4 - 84.4 %   Platelets 225 150 - 400 K/uL   nRBC 0.0 0.0 - 0.2 %    Comment: Performed at [facility], [address] [place], [place] [address]  Basic metabolic  panel with GFR     Status: Abnormal   Collection Time: 02/27/24  5:09 AM  Result Value Ref Range   Sodium 139 135 - 145 mmol/L   Potassium 3.5 3.5 - 5.1 mmol/L   Chloride 103 98 - 111 mmol/L   CO2 26 22 - 32 mmol/L   Glucose, Bld 89 70 - 99 mg/dL    Comment: Glucose reference range applies only to samples taken after fasting for at least 8 hours.   BUN 13 8 - 23 mg/dL   Creatinine, Ser 9.38 0.61 - 1.24 mg/dL   Calcium  8.6 (L) 8.9 - 10.3 mg/dL   GFR, Estimated >39 >39 mL/min    Comment: (NOTE) Calculated using the CKD-EPI Creatinine Equation (2021)    Anion gap 10 5 - 15    Comment: Performed at [facility], [address] [place], [place] [address]  Glucose, capillary     Status: Abnormal   Collection Time: 02/27/24  7:36 AM  Result Value Ref Range   Glucose-Capillary 125 (H) 70 - 99 mg/dL    Comment: Glucose reference range applies only to samples taken after fasting for at least 8 hours.   Comment 1 Notify RN    Comment 2 Document in Chart   Glucose, capillary     Status: None   Collection Time: 02/27/24 11:27 AM  Result Value Ref Range   Glucose-Capillary 93 70 - 99 mg/dL    Comment: Glucose reference range applies only to samples taken after fasting for at least 8 hours.   Comment 1 Notify RN    Comment 2 Document in Chart    *Note: Due to a large number of results and/or encounters for the requested time period, some results have not been displayed. A complete set of results can be found in Results Review.   US  EKG SITE RITE Result Date: 02/27/2024 If Site Rite image not attached, placement could not be confirmed due to current cardiac rhythm.  ECHO TEE Result Date: 02/25/2024    TRANSESOPHOGEAL ECHO REPORT   Patient Name:   Levi [name] Date of Exam: 02/25/2024 Medical Rec #:  [id number]     Height:       74.0 in Accession #:    [id number]    Weight:       190.0 lb Date of Birth:  24-Jan-1946     BSA:          2.127  m Patient Age:    77 years      BP:            94/55 mmHg Patient Gender: M             HR:           100 bpm. Exam Location:  Inpatient Procedure: Transesophageal Echo, Color Doppler and Cardiac Doppler (Both            Spectral and Color Flow Doppler were utilized during procedure). Indications:     Aortic Insufficiency i35.1, Bacteremia  History:         Patient has prior history of Echocardiogram examinations, most                  recent 02/23/2024. CHF, COPD, Arrythmias:Atrial Fibrillation;                  Risk Factors:Dyslipidemia.  Sonographer:     Damien Senior RDCS Referring Phys:  [id number] LEONTINE SAILOR LOCKWOOD Diagnosing Phys: Oneil Parchment MD PROCEDURE: After discussion of the risks and benefits of a TEE, an informed consent was obtained from the patient. The transesophogeal probe was passed without difficulty through the esophogus of the patient. Imaged were obtained with the patient in a left lateral decubitus position. Sedation performed by different physician. The patient was monitored while under deep sedation. Anesthestetic sedation was provided intravenously by Anesthesiology: 122mg  of Propofol , 100mg  of Lidocaine . The patient developed no complications during the procedure.  IMPRESSIONS  1. Left ventricular ejection fraction, by estimation, is 55 to 60%. The left ventricle has normal function.  2. Right ventricular systolic function is normal. The right ventricular size is normal.  3. No left atrial/left atrial appendage thrombus was detected.  4. The mitral valve is normal in structure. Mild mitral valve regurgitation.  5. The aortic valve is tricuspid. Aortic valve regurgitation is mild. Aortic valve sclerosis is present, with no evidence of aortic valve stenosis. Conclusion(s)/Recommendation(s): No evidence of vegetation/infective endocarditis on this transesophageael echocardiogram. FINDINGS  Left Ventricle: Left ventricular ejection fraction, by estimation, is 55 to 60%. The left ventricle has normal function. The left ventricular internal  cavity size was normal in size. Right Ventricle: The right ventricular size is normal. No increase in right ventricular wall thickness. Right ventricular systolic function is normal. Left Atrium: Left atrial size was normal in size. No left atrial/left atrial appendage thrombus was detected. Right Atrium: Right atrial size was normal in size. Pericardium: There is no evidence of pericardial effusion. Mitral Valve: The mitral valve is normal in structure. Mild mitral valve regurgitation. Tricuspid Valve: The tricuspid valve is normal in structure. Tricuspid valve regurgitation is trivial. Aortic Valve: The aortic valve is tricuspid. Aortic valve regurgitation is mild. Aortic valve sclerosis is present, with no evidence of aortic valve stenosis. Pulmonic Valve: The pulmonic valve was grossly normal. Pulmonic valve regurgitation is trivial. Aorta: The aortic root and ascending aorta are structurally normal, with no evidence of dilitation. IAS/Shunts: No atrial level shunt detected by color flow Doppler. Additional Comments: Spectral Doppler performed. Oneil Parchment MD Electronically signed by Oneil Parchment MD Signature Date/Time: 02/25/2024/3:04:24 PM    Final    EP STUDY Result Date: 02/25/2024 See surgical note for result.     Blood pressure 110/75, pulse 92, temperature 98.1 F (36.7 C), temperature source Oral, resp. rate 18, height 6' 2 (1.88 m), weight 86.2 kg, SpO2 93%.  Medical Problem List and Plan: 1. Functional deficits secondary to ***  -  patient may *** shower  -ELOS/Goals: *** 2.  Antithrombotics: -DVT/anticoagulation:  Pharmaceutical: Eliquis   -antiplatelet therapy: N/A 3. Pain Management: Oxycodone  prn for severe pain. Tylenol  prn for  mild pain.  4. Mood/Behavior/Sleep: LCSW to follow for evaluation and support.   --trazodone  prn for insomnia.   -antipsychotic agents: N/A 5. Neuropsych/cognition: This patient *** capable of making decisions on *** own behalf. 6. Skin/Wound Care:   Routine pressure relief measures.   7. Fluids/Electrolytes/Nutrition:  Monitor I/O. Check CMET in am. Check CK  8. MRSA bacteremia: On Daptomycin X 4 weeks with EOT 03/22/24 --Ck-119 on 09/29. Recheck on Monday.   --Recurrent leucocytosis 13.7-->9.4-->11.8 9. Indurated area left buttock/cellulitis?: Monitor for worsening. Augmentin  added on 09/30 w/ antibiotic duration to continue pending improvement.  --Repeat imaging in case of worsening to rule out abscess formation.   10. A fib: Monitor HR TID--on Eliquis . HR controlled on cardizem .   11. HFmrEF: Monitor daily weights and for signs of overload.  --On Lipitor, Eliquis --off Imdur  due to soft BP.   11. COPD: On chronic prednisone  20 mg (unable to tolerate taper)  --continue Brovana  BID with duo nebs prn. Hypokalemia: Being supplemented intermittently. Low normal currently.  12. Hypomagnesemia: Improved with supplementation. Was 1.6-->2.0  --recheck in am.  13. Constipation: Last BM 09/30--on senna-->increase to 2  and continue miralax  BID.  --MOM today. Increase   14.  Neuropathy: Followed by [facility].   --On Lyrica  75 mg TID --off duloxetine .  15. Anemia: Continue iron  supplement.  16. H/o BPH: Foley remains in place-->work on bowel regimen today and remove foley to start voiding trial tomorrow.    ***  Sharlet GORMAN Schmitz, PA-C 02/27/2024

## 2024-02-27 NOTE — Plan of Care (Signed)
 Wound Plan     Wounds present: Papery skin with diffuse ecchymotic and flaking BUE. RUE with evidence of third spacing and dependent edema around elbow. LLE with multiple scabs covered with foam dressing. 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 Pressure area with scab on tibial prominence.   Indurated area left buttock/cellulitis vs abscess  Stage 1 mid sacrum Stage 2 left buttocks  Interventions:  Standing Nursing Skin Care Order activation Pressure redistribution cushion for w/c Prevalon Boot left LE Dietician consult:malnutrition in the context of chronic illness and has been ongoing issue.  Foam to sacrum PROSource + BID Eucerin cream to extremities BID MVI daily Juven BID tween meals  Fergon daily Regular diet; encourage food from home  Standing nursing care orders Air mattress bed Pressure redistribution cushion for chair Glucerna BID tween meals MVI daily  Braden Score: 15 Sensory: 4 Moisture: 3 Activity: 2 Mobility: 3 Nutrition: 2 Friction: 1  Contributors: Josette Glance, MS, RDN, LDN  Barnie Ronde, BSN, RN-BC, CRRN

## 2024-02-27 NOTE — Progress Notes (Signed)
 Patient ID: Levi [name], male   DOB: 1945/06/08, 78 y.o.   MRN: [id number]  Active with [name] with PT OT RN.

## 2024-02-28 DIAGNOSIS — K5901 Slow transit constipation: Secondary | ICD-10-CM | POA: Diagnosis not present

## 2024-02-28 DIAGNOSIS — N400 Enlarged prostate without lower urinary tract symptoms: Secondary | ICD-10-CM | POA: Diagnosis not present

## 2024-02-28 DIAGNOSIS — R5381 Other malaise: Secondary | ICD-10-CM | POA: Diagnosis not present

## 2024-02-28 LAB — CBC WITH DIFFERENTIAL/PLATELET
Basophils Absolute: 0.1 K/uL (ref 0.0–0.1)
Basophils Relative: 1 %
Eosinophils Absolute: 0 K/uL (ref 0.0–0.5)
Eosinophils Relative: 0 %
HCT: 31.1 % — ABNORMAL LOW (ref 39.0–52.0)
Hemoglobin: 9.9 g/dL — ABNORMAL LOW (ref 13.0–17.0)
Lymphocytes Relative: 8 %
Lymphs Abs: 0.9 K/uL (ref 0.7–4.0)
MCH: 28.4 pg (ref 26.0–34.0)
MCHC: 31.8 g/dL (ref 30.0–36.0)
MCV: 89.1 fL (ref 80.0–100.0)
Monocytes Absolute: 0 K/uL — ABNORMAL LOW (ref 0.1–1.0)
Monocytes Relative: 0 %
Neutro Abs: 10 K/uL — ABNORMAL HIGH (ref 1.7–7.7)
Neutrophils Relative %: 91 %
Platelets: 236 K/uL (ref 150–400)
RBC: 3.49 MIL/uL — ABNORMAL LOW (ref 4.22–5.81)
RDW: 17.1 % — ABNORMAL HIGH (ref 11.5–15.5)
WBC: 11 K/uL — ABNORMAL HIGH (ref 4.0–10.5)
nRBC: 0 % (ref 0.0–0.2)

## 2024-02-28 LAB — COMPREHENSIVE METABOLIC PANEL WITH GFR
ALT: 26 U/L (ref 0–44)
AST: 19 U/L (ref 15–41)
Albumin: 2 g/dL — ABNORMAL LOW (ref 3.5–5.0)
Alkaline Phosphatase: 82 U/L (ref 38–126)
Anion gap: 12 (ref 5–15)
BUN: 13 mg/dL (ref 8–23)
CO2: 25 mmol/L (ref 22–32)
Calcium: 8.1 mg/dL — ABNORMAL LOW (ref 8.9–10.3)
Chloride: 102 mmol/L (ref 98–111)
Creatinine, Ser: 0.57 mg/dL — ABNORMAL LOW (ref 0.61–1.24)
GFR, Estimated: >60 mL/min (ref >60)
Glucose, Bld: 117 mg/dL — ABNORMAL HIGH (ref 70–99)
Potassium: 3.5 mmol/L (ref 3.5–5.1)
Sodium: 139 mmol/L (ref 135–145)
Total Bilirubin: 0.4 mg/dL (ref 0.0–1.2)
Total Protein: 5.1 g/dL — ABNORMAL LOW (ref 6.5–8.1)

## 2024-02-28 LAB — CULTURE, BLOOD (ROUTINE X 2)
Culture: NO GROWTH
Culture: NO GROWTH
Special Requests: ADEQUATE
Special Requests: ADEQUATE

## 2024-02-28 LAB — MAGNESIUM: Magnesium: 2 mg/dL (ref 1.7–2.4)

## 2024-02-28 MED ORDER — CHLORHEXIDINE GLUCONATE CLOTH 2 % EX PADS
6.0000 | MEDICATED_PAD | Freq: Two times a day (BID) | CUTANEOUS | Status: DC
  Administered 2024-02-29 – 2024-03-12 (×24): 6 via TOPICAL

## 2024-02-28 NOTE — Progress Notes (Signed)
 PROGRESS NOTE   Subjective/Complaints:  Pt doing well, slept well, pain doing ok, LBM this morning but not documented, urinating fine, foley out this morning. No other complaints or concerns.   ROS: as per HPI. Denies CP, SOB, abd pain, N/V/D/C, or any other complaints at this time.    Objective:   US  EKG SITE RITE Result Date: 02/27/2024 If Site Rite image not attached, placement could not be confirmed due to current cardiac rhythm.  Recent Labs    02/27/24 0509 02/28/24 0412  WBC 11.8* 11.0*  HGB 10.0* 9.9*  HCT 31.0* 31.1*  PLT 225 236   Recent Labs    02/27/24 0509 02/28/24 0412  NA 139 139  K 3.5 3.5  CL 103 102  CO2 26 25  GLUCOSE 89 117*  BUN 13 13  CREATININE 0.61 0.57*  CALCIUM  8.6* 8.1*        Intake/Output Summary (Last 24 hours) at 02/28/2024 1154 Last data filed at 02/28/2024 0900 Gross per 24 hour  Intake 10 ml  Output 950 ml  Net -940 ml     Wound 02/24/24 0441 Pressure Injury Sacrum Mid Stage 1 -  Intact skin with non-blanchable redness of a localized area usually over a bony prominence. (Active)     Wound 02/24/24 0442 Pressure Injury Buttocks Left Stage 2 -  Partial thickness loss of dermis presenting as a shallow open injury with a red, pink wound bed without slough. (Active)    Physical Exam: Vital Signs Blood pressure 109/72, pulse 75, temperature 97.8 F (36.6 C), temperature source Oral, resp. rate 17, height 6' 2 (1.88 m), weight 83.6 kg, SpO2 94%.  Physical Exam Vitals and nursing note reviewed.  Constitutional: No apparent distress. Appropriate appearance for age. Laying in bed. Comfortable.  HENT: No JVD. Neck Supple. Trachea midline. Atraumatic, normocephalic. Eyes: PERRLA. EOMI. Visual fields grossly intact.  Cardiovascular: RRR, no murmurs/rub/gallops. No LE Edema.  Respiratory: CTAB. No rales, rhonchi, or wheezing. On RA.  Abdomen: + bowel sounds, normoactive. No  distention or tenderness.   Ext: R BKA   PRIOR EXAMS: Skin: Papery skin with diffuse ecchymotic and flaking BUE. RUE with evidence of third spacing and dependent edema around elbow.  LLE with multiple scabs covered with foam dressing.  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  Pressure area with scab on tibial prominence.     MSK:      + R BKA       + L second toe amputation       + Right shoulder range of motion 0 degrees in flexion, abduction; right shoulder visibly protruding forward from the socket.  No crepitus with passive range of motion.       Neurologic exam:  Cognition: AAO to person, place, time and event.  Language: Fluent, No substitutions or neoglisms. No dysarthria. Names 3/3 objects correctly.  Memory: Recalls 3/3 objects at 5 minutes. No apparent deficits  Insight: Good  insight into current condition.  Mood: Pleasant affect, appropriate mood.  Sensation: To light reduced in left foot and bilateral fingertips in stocking glove pattern.  + Difficulty with proprioception in right hand digits 3 through 5  Reflexes: 2+ in BL UE and LEs. Negative Hoffman's and babinski signs bilaterally.  CN: 2-12 grossly intact.  Coordination: No apparent tremors. No ataxia  Spasticity: MAS 0 in all extremities.       Strength:                RUE: 0/5 SA, 2/5 EF, 3/5 EE, 4/5 WE, 4/5 FF, 4/5 FA                LUE:  4/5 SA, 4/5 EF, 4/5 EE, 4/5 WE, 4/5 FF, 4/5 FA                RLE: 4/5 HF, 5/5 KE                 LLE:  4/5 HF, 5/5 KE, 3/5  DF, 3/5  EHL, 2/5  PF  Assessment/Plan: 1. Functional deficits which require 3+ hours per day of interdisciplinary therapy in a comprehensive inpatient rehab setting. Physiatrist is providing close team supervision and 24 hour management of active medical problems listed below. Physiatrist and rehab team continue to assess barriers to discharge/monitor patient progress toward functional and medical goals  Care Tool:  Bathing               Bathing assist       Upper Body Dressing/Undressing Upper body dressing        Upper body assist      Lower Body Dressing/Undressing Lower body dressing            Lower body assist       Toileting Toileting    Toileting assist       Transfers Chair/bed transfer  Transfers assist           Locomotion Ambulation   Ambulation assist              Walk 10 feet activity   Assist           Walk 50 feet activity   Assist           Walk 150 feet activity   Assist           Walk 10 feet on uneven surface  activity   Assist           Wheelchair     Assist               Wheelchair 50 feet with 2 turns activity    Assist            Wheelchair 150 feet activity     Assist          Blood pressure 109/72, pulse 75, temperature 97.8 F (36.6 C), temperature source Oral, resp. rate 17, height 6' 2 (1.88 m), weight 83.6 kg, SpO2 94%.  Medical Problem List and Plan: 1. Functional deficits secondary to debility secondary to MRSA bacteremia d/t pneumonia             -patient may shower             -ELOS/Goals: 14-16 days, [facility] PT/OT              -Continue CIR - Patient states he has right BKA prosthesis awaiting pickup; would contact [employer] this week to see if it can be brought in.   2.  Antithrombotics: -DVT/anticoagulation:  Pharmaceutical: Eliquis  5mg  BID             -antiplatelet therapy: N/A 3. Pain Management: Oxycodone  prn for  severe pain. Tylenol  prn for  mild pain.  4. Mood/Behavior/Sleep: LCSW to follow for evaluation and support.              --trazodone  prn for insomnia. Melatonin 5mg  nightly             -antipsychotic agents: N/A 5. Neuropsych/cognition: This patient is capable of making decisions on his own behalf. 6. Skin/Wound Care:  Routine pressure relief measures.    7. Fluids/Electrolytes/Nutrition:  Monitor I/O. Monitor labs. Check CK qMon.   -GERD: protonix  40mg   BID  -02/28/24 CMP stable 8. MRSA bacteremia: On Daptomycin X 4 weeks with EOT 03/22/24 --Ck-119 on 09/29. Recheck on Monday.   --Recurrent leucocytosis 13.7-->9.4-->11.8 -02/28/24 WBC 11.0 today, monitor.  9. Indurated area left buttock/cellulitis?: Monitor for worsening. Augmentin  added on 09/30 w/ antibiotic duration to continue pending improvement.  --Repeat imaging in case of worsening to rule out abscess formation.   10. A fib: Monitor HR TID--on Eliquis . HR controlled on cardizem  180mg  daily.   11. HFmrEF: Monitor daily weights and for signs of overload.             --On Lipitor 20mg  daily, Eliquis --off Imdur  due to soft BP.    -02/28/24 added daily weights order Filed Weights   02/27/24 1510  Weight: 83.6 kg    11. COPD: On chronic prednisone  20 mg (unable to tolerate taper) -continue Brovana  BID with duo nebs prn.  12. Hypokalemia: Being supplemented intermittently. Low normal currently. 10/4 3.5, monitor 13. Hypomagnesemia: Improved with supplementation. Was 1.6-->2.0             -02/28/24 Mg 2.0 today, monitor.  14. Constipation: Last BM 09/30--on senna-->increase to 2 tabs daily and continue miralax  BID.             --MOM today. Increase as needed.   -02/28/24 LBM today but not documented.    15.  Neuropathy: Followed by [facility].  --On Lyrica  75 mg TID --off duloxetine .  -- of note, ordered as 150mg  daily +75mg  nightly   16. Anemia: Continue iron  supplement.  17. H/o BPH: Foley remains in place-->work on bowel regimen today and remove foley to start voiding trial tomorrow. Continue Flomax  0.4mg  nightly  -02/28/24 foley out this morning, nursing to monitor PVRs   18.  Right shoulder rotator cuff tear.  Last [facility] - Has outpatient CT shoulder ordered; would consider getting while he is inpatient to help plan outpatient surgery             - No formal weightbearing precautions    LOS: 1 days A FACE TO FACE EVALUATION WAS  PERFORMED  [address] 02/28/2024, 11:54 AM

## 2024-02-28 NOTE — Plan of Care (Signed)
  Problem: Consults Goal: RH GENERAL PATIENT EDUCATION Description: See Patient Education module for education specifics. Outcome: Progressing   Problem: RH BOWEL ELIMINATION Goal: RH STG MANAGE BOWEL WITH ASSISTANCE Description: STG Manage Bowel with mod I  Assistance. Outcome: Progressing Goal: RH STG MANAGE BOWEL W/MEDICATION W/ASSISTANCE Description: STG Manage Bowel with Medication with mod I  Assistance. Outcome: Progressing   Problem: RH BLADDER ELIMINATION Goal: RH STG MANAGE BLADDER WITH ASSISTANCE Description: STG Manage Bladder With toileting Assistance Outcome: Progressing Goal: RH STG MANAGE BLADDER WITH MEDICATION WITH ASSISTANCE Description: STG Manage Bladder With Medication With mod I Assistance. Outcome: Progressing   Problem: RH SKIN INTEGRITY Goal: RH STG SKIN FREE OF INFECTION/BREAKDOWN Description: Patient and wife will be able to manage skin with min assist Outcome: Progressing Goal: RH STG MAINTAIN SKIN INTEGRITY WITH ASSISTANCE Description: STG Maintain Skin Integrity With min Assistance. Outcome: Progressing Goal: RH STG ABLE TO PERFORM INCISION/WOUND CARE W/ASSISTANCE Description: STG Able To Perform Incision/Wound Care With Assistance. Outcome: Progressing   Problem: RH SAFETY Goal: RH STG ADHERE TO SAFETY PRECAUTIONS W/ASSISTANCE/DEVICE Description: STG Adhere to Safety Precautions With cues Assistance/Device. Outcome: Progressing   Problem: RH PAIN MANAGEMENT Goal: RH STG PAIN MANAGED AT OR BELOW PT'S PAIN GOAL Description: Pain < 4 with prns Outcome: Progressing   Problem: RH KNOWLEDGE DEFICIT GENERAL Goal: RH STG INCREASE KNOWLEDGE OF SELF CARE AFTER HOSPITALIZATION Description: Patient and wife will be able to manage care at discharge using educational resources independently Outcome: Progressing

## 2024-02-29 DIAGNOSIS — R5381 Other malaise: Secondary | ICD-10-CM | POA: Diagnosis not present

## 2024-02-29 DIAGNOSIS — R4189 Other symptoms and signs involving cognitive functions and awareness: Secondary | ICD-10-CM

## 2024-02-29 DIAGNOSIS — K5901 Slow transit constipation: Secondary | ICD-10-CM | POA: Diagnosis not present

## 2024-02-29 DIAGNOSIS — N400 Enlarged prostate without lower urinary tract symptoms: Secondary | ICD-10-CM | POA: Diagnosis not present

## 2024-02-29 MED ORDER — PROSOURCE PLUS PO LIQD
30.0000 mL | Freq: Two times a day (BID) | ORAL | Status: DC
  Administered 2024-02-29 – 2024-03-11 (×23): 30 mL via ORAL
  Filled 2024-02-29 (×17): qty 30

## 2024-02-29 MED ORDER — JUVEN PO PACK
1.0000 | PACK | Freq: Two times a day (BID) | ORAL | Status: DC
  Administered 2024-02-29 – 2024-03-12 (×24): 1 via ORAL
  Filled 2024-02-29 (×19): qty 1

## 2024-02-29 MED ORDER — ADULT MULTIVITAMIN W/MINERALS CH
1.0000 | ORAL_TABLET | Freq: Every day | ORAL | Status: DC
  Administered 2024-03-01 – 2024-03-12 (×12): 1 via ORAL
  Filled 2024-02-29 (×12): qty 1

## 2024-02-29 NOTE — Evaluation (Signed)
 MOccupational Therapy Assessment and Plan  Patient Details  Name: Levi [name] MRN: [id number] Date of Birth: 08-25-1945  OT Diagnosis: muscle weakness (generalized) Rehab Potential: Rehab Potential (ACUTE ONLY): Good ELOS: 10-12 days   Today's Date: 02/29/2024 OT Individual Time: 1045-1200 OT Individual Time Calculation (min): 75 min     Hospital Problem: Principal Problem:   Debility   Past Medical History:  Past Medical History:  Diagnosis Date   Agent orange exposure 1970's   takes Imdur ,Diltiazem , and Atacand  daily   Agent orange exposure    ALLERGIC RHINITIS    Allergy     seasonal allergies   Anemia    iron  deficiency   Arthritis    Asthma    uses inhaler and nebulizers   Atrial fibrillation (HCC)    Bruises easily    d/t prednisone  daily   Cataract    Cellulitis 03/22/2020   Chest wall pain 09/21/2019   L scapula, reported 4/27 21/     CHF (congestive heart failure) (HCC)    dx-on meds to treat   Chronic bronchitis (HCC)    used to get it q yr; last time ?2008 (04/28/2012)   Clotting disorder 2019   On blood thinner when I bleed it is for a consideral amount of time. This is tegardless of size of wound.   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 2010   History of total right hip arthroplasty 07/31/2015   Hyperlipidemia     takes Lipitor daily; pt. states he takes a preventive   Hypothyroidism    Joint pain    Joint swelling    Leukocytosis    Lumbar stenosis status post T12-S1 fusion 04/07/2017   MSSA bacteremia 03/19/2023   Nausea and vomiting 11/12/2022   Neuromuscular disorder (HCC)    bilat legs, and bilat arms   Neuropathy    Osteoarthritis of left knee 01/10/2012   Osteoporosis 2012   PAF (paroxysmal atrial fibrillation) (HCC)    a. Dx 04/2012, CHA2DS2VASc = 1 (age);  b. 04/2012 Echo: EF 40-50%, mild MR.   Personal history of colonic adenomas 02/09/2013   Pneumonia    Primary osteoarthritis of right knee 06/17/2014   Sepsis (HCC) 03/18/2023   Shortness of breath dyspnea    Thyroid  disease    no taking meds-caused dizziness   Past Surgical History:  Past Surgical History:  Procedure Laterality Date   27 HOUR PH STUDY N/A 05/30/2021   Procedure: 24 HOUR PH STUDY;  Surgeon: San Sandor GAILS, DO;  Location: [facility];  Service: Gastroenterology;  Laterality: N/A;   AMPUTATION Right 11/14/2023   Procedure: AMPUTATION BELOW KNEE;  Surgeon: Harden Jerona GAILS, MD;  Location: [facility] OR;  Service: Orthopedics;  Laterality: Right;   AMPUTATION TOE Bilateral 08/13/2023   Procedure: LEFT SECOND TOE AMPUTATION AND RIGHT FIFTH TOE AMPUTATION;  Surgeon: Malvin Marsa FALCON, DPM;  Location: [facility] OR;  Service: Orthopedics/Podiatry;  Laterality: Bilateral;   ANTERIOR CERVICAL DECOMP/DISCECTOMY FUSION N/A 07/21/2012   Procedure: ANTERIOR CERVICAL DECOMPRESSION/DISCECTOMY FUSION 2 LEVELS;  Surgeon: Victory Gens, MD;  Location: [facility];  Service: Neurosurgery;  Laterality: N/A;  C4-5 C5-6 Anterior cervical decompression/diskectomy/fusion   ANTERIOR LAT LUMBAR FUSION N/A 04/07/2017   Procedure: Thoracic twelve-Lumbar one Anterolateral decompression/fusion;  Surgeon: Gens Victory, MD;  Location: [facility];  Service: Neurosurgery;  Laterality: N/A;   ARTHRODESIS FOOT WITH WEIL OSTEOTOMY Left 11/07/2022   Procedure:  LEFT THIRD METATARSOPHALANGEAL CAPSULOTOMY, WEIL OSTEOTOMY;  Surgeon: Elsa Lonni SAUNDERS, MD;  Location[facility];  Service: Orthopedics;  Laterality: Left;  LENGTH OF SURGERY: 60 MINUTES   BACK SURGERY     x 3   BELOW KNEE LEG AMPUTATION Right    June 2025   BONE EXOSTOSIS EXCISION Right 10/16/2023   Procedure: EXCISION, EXOSTOSIS;  Surgeon: Janit Thresa HERO, DPM;  Location: [facility];  Service: Orthopedics/Podiatry;  Laterality: Right;   CARDIAC CATHETERIZATION  11/2008   Dr. Jeffrie - 20% calcified non flow limiting left main, 50% EF apical hypokinesis   CARPAL TUNNEL RELEASE Bilateral    CERVICAL DISC ARTHROPLASTY N/A 04/07/2017   Procedure: Cervical six-seven Disc arthroplasty;  Surgeon: Gens Victory, MD;  Location: [facility] OR;  Service: Neurosurgery;  Laterality: N/A;   CHEST TUBE INSERTION Left 04/11/2017   Procedure: CHEST TUBE INSERTION;  Surgeon: Fleeta Hanford Coy, MD;  Location: [facility] OR;  Service: Thoracic;  Laterality: Left;   CHOLECYSTECTOMY     COLONOSCOPY  2016   CG-MAC-miralax (good)servere TICS/TA x 2   ESOPHAGEAL MANOMETRY N/A 05/30/2021   Procedure: ESOPHAGEAL MANOMETRY (EM);  Surgeon: San Sandor GAILS, DO;  Location: [facility];  Service: Gastroenterology;  Laterality: N/A;  ph impedence   ESOPHAGOGASTRODUODENOSCOPY     EYE SURGERY Bilateral 2011   steroidal encapsulation (04/28/2012)   FOOT SURGERY Right    x 2   JOINT REPLACEMENT     Many surgeries including 5 joit replacements   KNEE ARTHROSCOPY Bilateral    LATERAL / POSTERIOR COMBINED FUSION LUMBAR SPINE  2012   LEFT ATRIAL APPENDAGE OCCLUSION N/A 02/22/2021   Procedure: LEFT ATRIAL APPENDAGE OCCLUSION;  Surgeon: Cindie Ole DASEN, MD;  [facility];  Service: Cardiovascular;  Laterality: N/A;   NEUROPLASTY / TRANSPOSITION ULNAR NERVE AT ELBOW Bilateral    POLYPECTOMY  2016   severe TICS/TA x 2   POSTERIOR CERVICAL LAMINECTOMY WITH MET- RX Right 06/11/2021   Procedure: Right Cervical six-seven   Laminectomy and foraminotomy with metrex;  Surgeon: Gens Victory, MD;  Location: [facility];  Service: Neurosurgery;  Laterality: Right;   REFRACTIVE SURGERY Left 07/07/2023   laser surgery but still has floaters   REVERSE SHOULDER ARTHROPLASTY  04/28/2012   Procedure: REVERSE SHOULDER ARTHROPLASTY;  Surgeon: Eva Elsie Herring, MD;  Location: [facility] OR;  Service: Orthopedics;  Laterality: Left;  Left shouder reverse total shoulder arthroplasty   SHOULDER SURGERY     SPINE SURGERY     I have had 7 spinal surgeries   TEE WITHOUT CARDIOVERSION N/A 02/22/2021   Procedure: TRANSESOPHAGEAL ECHOCARDIOGRAM (TEE);  Surgeon: Cindie Ole DASEN, MD;  Location: [facility] INVASIVE CV LAB;  Service: Cardiovascular;  Laterality: N/A;   TENDON REPAIR Right 08/07/2020   Procedure: RIGHT HAND LIGAMENT RECONSTRUCTION AND TENDON INTERPOSITION;  Surgeon:  Yvone Rush, MD;  Location: [facility];  Service: Orthopedics;  Laterality: Right;   TONSILLECTOMY AND ADENOIDECTOMY  1954   TOTAL HIP ARTHROPLASTY Left 2011   left (04/28/2012)   TOTAL HIP ARTHROPLASTY Right 07/31/2015   Procedure: TOTAL HIP ARTHROPLASTY ANTERIOR APPROACH;  Surgeon: Rush Yvone, MD;  [facility];  Service: Orthopedics;  Laterality: Right;   TOTAL KNEE ARTHROPLASTY  01/10/2012   Procedure: TOTAL KNEE ARTHROPLASTY;  Surgeon: Rush LITTIE Yvone, MD;  Location: [facility];  Service: Orthopedics;;  left total knee arthroplasty   TOTAL KNEE ARTHROPLASTY Right 06/17/2014   Procedure: TOTAL KNEE ARTHROPLASTY;  Surgeon: Rush LITTIE Yvone, MD;  Location: [facility];  Service: Orthopedics;  Laterality: Right;   toupet fundolplication  11/01/2021   TRANSESOPHAGEAL ECHOCARDIOGRAM (CATH LAB) N/A 03/24/2023   Procedure: TRANSESOPHAGEAL ECHOCARDIOGRAM;  Surgeon: Mona Vinie BROCKS, MD;  [facility];  Service: Cardiovascular;  Laterality: N/A;   TRANSESOPHAGEAL ECHOCARDIOGRAM (CATH LAB) N/A 02/25/2024   Procedure: TRANSESOPHAGEAL ECHOCARDIOGRAM;   Surgeon: Jeffrie Oneil BROCKS, MD;  [facility]: [facility];  Service: Cardiovascular;  Laterality: N/A;    Assessment & Plan Clinical Impression:  RISHAB STOUDT is a 78 year old male with history of HTN, PAF- on eliquis , GERD, HFmrEF (Dr. COPD- steroid dependent, right foot osteomyelitis s/p R-BKA (CIR 6/22-7/11/25), chronic sacral decub, peripheral neuropathy with recent falls, recent admission for PNA 08/25 as well as issues with fall with right hip pain/mild concussion, R-shoulder pain  who was admitted on 02/21/24 with generalized weakness with malaise and poor intake, fevers, non healing wound right medial stump and reports of recent URI with ongoing cough despite course of antibiotics. He was noted have soft BP, lactic acidosis and LLL consolidation concerning for sepsis due to PNA. Foley was placed due to concerns of retention?   He was started on IVF, broad spectrum antibiotics and BC done revealing MRSA.  Dr. Overton consulted and recommended transitioning to Daptomycin as well as TEE which revealing EF 50-55% with mild MVR, AVS without stenosis and negative for shunt, thrombus or endocarditis. He was noted to have left buttock pian with erythema and CT pelvis ordered which showed diffuse SQ edema with induration soft tissue left buttock , fractures of superior and inferior pubic rami subacute or chronic with extensive demineralization but no gas, fluid collection or abscess. Augmentin  added by Dr. Evelynn due to concerns of possible abscess formation. Foley remains in place. PT/OT consulted and patient requires mod.  Patient transferred to CIR on 02/27/2024 .    Patient currently requires min to [facility] with basic self-care skills secondary to muscle weakness.  Prior to hospitalization, patient could complete BADL with modified independent .  Patient will benefit from skilled intervention to decrease level of assist with basic self-care skills prior to discharge home with care partner.  Anticipate patient  will require 24 hour supervision and ModI.  OT - End of Session Activity Tolerance: Tolerates 30+ min activity with multiple rests Endurance Deficit: Yes Endurance Deficit Description: 5 on 0-10 OT Assessment Rehab Potential (ACUTE ONLY): Good OT Patient demonstrates impairments in the following area(s): Balance;Endurance OT Basic ADL's Functional Problem(s): Bathing;Dressing;Toileting OT Transfers Functional Problem(s): Toilet;Tub/Shower OT Additional Impairment(s): Fuctional Use of Upper Extremity (RUE for active mobility) OT Plan OT Duration/Estimated Length of Stay: 10-12 days OT Treatment/Interventions: Balance/vestibular training;Self Care/advanced ADL retraining;Therapeutic Activities;UE/LE Coordination activities;Therapeutic Exercise;DME/adaptive equipment instruction;UE/LE Strength taining/ROM;Functional mobility training OT Self Feeding Anticipated Outcome(s): ModI OT Basic Self-Care Anticipated Outcome(s): ModI to  MinA OT Toileting Anticipated Outcome(s): ModI to [employer] OT Bathroom Transfers Anticipated Outcome(s): ModI to MinA OT Recommendation Recommendations for Other Services: None Patient destination: Home Follow Up Recommendations: Other (comment) (TBD) Equipment Recommended: 3 in 1 bedside comode OT intensity and frequency: Minimum of 1-2x /day, 45 to 90 minutes OT Frequency 5 out of 7 days  OT Evaluation Precautions/Restrictions  Precautions Precautions: Fall Recall of Precautions/Restrictions: Intact Precaution/Restrictions Comments: R BKA Restrictions Weight Bearing Restrictions Per Provider Order: No Other Position/Activity Restrictions: chronic R BKA, does not have prosthetic yet but was supposed to pick up from [employer] this week, MRSA General Chart Reviewed: Yes Family/Caregiver Present: No Vital Signs Therapy Vitals Pulse Rate: 95 BP: 126/67 Patient Position (if appropriate): Lying Oxygen Therapy SpO2: 94 % O2 Device: Room Air Pain Pain  Assessment Pain Score: 8  Pain Type: Chronic pain Pain Location: Shoulder Pain Orientation: Right Pain Descriptors / Indicators: Aching Pain Intervention(s): Medication (See eMAR) Multiple Pain Sites: Yes 2nd Pain Site Pain Type: Chronic pain Pain Location: Knee Pain Orientation: Right Pain Descriptors / Indicators: Aching Pain Frequency: Occasional Pain Intervention(s): Medication (See eMAR) Home Living/Prior Functioning Home Living Family/patient expects to be discharged to:: Private residence Living Arrangements: Spouse/significant other Available Help at Discharge: Family, Available 24 hours/day Type of Home: House Home Access: Ramped entrance Home Layout: Able to live on main level with bedroom/bathroom Bathroom Shower/Tub: Health visitor: Handicapped height Bathroom Accessibility: Yes Additional Comments: pt reports he was supposed to get a prosthetic for RLE next week  Lives With: Spouse IADL History Homemaking Responsibilities: No (wife does home making) Current License: No 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 Prior Function Level of Independence: Needs assistance with ADLs, Needs assistance with tranfers, Other (comment) Toileting: Moderate Dressing: Moderate  Able to Take Stairs?: No Driving: No Vocation: Retired Administrator, sports Baseline Vision/History: 1 Wears glasses Ability to See in Adequate Light: 0 Adequate Patient Visual Report: Diplopia (readers) Vision Assessment?: Vision impaired- to be further tested in functional context Perception  Perception: Within Functional Limits Praxis Praxis: WFL Cognition Cognition Overall Cognitive Status: Within Functional Limits for tasks assessed Arousal/Alertness: Awake/alert Memory: Appears intact Awareness: Appears intact Problem Solving: Impaired Problem Solving Impairment: Verbal basic Safety/Judgment: Impaired Brief Interview for Mental Status (BIMS) Repetition  of Three Words (First Attempt): 3 Temporal Orientation: Year: Correct Temporal Orientation: Month: Accurate within 5 days Temporal Orientation: Day: Correct Recall: Sock: Yes, no cue required Recall: Blue: Yes, no cue required Recall: Bed: Yes, no cue required BIMS Summary Score: 15 Sensation Sensation Light Touch: Impaired by gross assessment Additional Comments: B LE/UE neuropathy; pt able to detect light touch in all dermatomes L LE however endorses N&T. Pt unable to detect light touch on R BKA. Coordination Gross Motor Movements are Fluid and Coordinated: No Fine Motor Movements are Fluid and Coordinated: No Coordination and Movement Description: limited 2/2 R LE BKA, L LE neuropathy, and R UE ROM limited 2/2 rototor cuff injury (surgery on hold per pt) Motor  Motor Motor: Other (comment) Motor - Skilled Clinical Observations: R LE BKA  Trunk/Postural Assessment  Cervical Assessment Cervical Assessment: Exceptions to [facility] Thoracic Assessment Thoracic Assessment: Exceptions to [facility] Lumbar Assessment Lumbar Assessment: Exceptions to [facility] Postural Control Postural Control: Deficits on evaluation Righting Reactions: delayed Protective Responses: delayed  Balance Balance Balance Assessed: Yes Static Sitting Balance Static Sitting - Balance Support: Bilateral upper extremity supported Static Sitting - Level of Assistance: 5: Stand by assistance;4: Min assist Dynamic Sitting Balance Dynamic  Sitting - Balance Support: Bilateral upper extremity supported Dynamic Sitting - Level of Assistance: 4: Min assist Sitting balance - Comments: right lateral lean Extremity/Trunk Assessment RUE Assessment Passive Range of Motion (PROM) Comments: WFL Active Range of Motion (AROM) Comments: 48 degree of flexion General Strength Comments: 3-/5MMT LUE Assessment Passive Range of Motion (PROM) Comments: WFL Active Range of Motion (AROM) Comments: WFL General Strength Comments:  3/5  Care Tool Care Tool Self Care Eating   Eating Assist Level: Set up assist    Oral Care    Oral Care Assist Level: Set up assist    Bathing   Body parts bathed by patient: Right arm;Left arm;Chest;Abdomen;Front perineal area;Right upper leg;Left upper leg;Face Body parts bathed by helper: Buttocks;Right lower leg;Left lower leg   Assist Level: Moderate Assistance - Patient 50 - 74%    Upper Body Dressing(including orthotics)   What is the patient wearing?: Pull over shirt   Assist Level: Set up assist    Lower Body Dressing (excluding footwear)   What is the patient wearing?: Pants Assist for lower body dressing: Maximal Assistance - Patient 25 - 49%    Putting on/Taking off footwear   What is the patient wearing?: Socks;Shoes Assist for footwear: Dependent - Patient 0%       Care Tool Toileting Toileting activity   Assist for toileting: Moderate Assistance - Patient 50 - 74%     Care Tool Bed Mobility Roll left and right activity   Roll left and right assist level: Minimal Assistance - Patient > 75%    Sit to lying activity   Sit to lying assist level: Minimal Assistance - Patient > 75%    Lying to sitting on side of bed activity   Lying to sitting on side of bed assist level: the ability to move from lying on the back to sitting on the side of the bed with no back support.: Moderate Assistance - Patient 50 - 74%     Care Tool Transfers Sit to stand transfer Sit to stand activity did not occur: Safety/medical concerns      Chair/bed transfer   Chair/bed transfer assist level: Moderate Assistance - Patient 50 - 74% (ModA  for SBT)     Toilet transfer   Assist Level: Moderate Assistance - Patient 50 - 74% (Using sliding board)     Care Tool Cognition  Expression of Ideas and Wants Expression of Ideas and Wants: 4. Without difficulty (complex and basic) - expresses complex messages without difficulty and with speech that is clear and easy to understand   Understanding Verbal and Non-Verbal Content Understanding Verbal and Non-Verbal Content: 4. Understands (complex and basic) - clear comprehension without cues or repetitions   Memory/Recall Ability Memory/Recall Ability : Current season;Location of own room   Refer to Care Plan for Long Term Goals  SHORT TERM GOAL WEEK 1 OT Short Term Goal 1 (Week 1): The pt will safely complete LB bathing/dressing with ModI incorporating AE as needed. OT Short Term Goal 2 (Week 1): The pt will safely complete all functional t/f 's using a sliding board at [employer] OT Short Term Goal 3 (Week 1): The pt will safely participate in > 30 minutes of Occupational Therapy activity with minimal rest breaks. OT Short Term Goal 4 (Week 1): The pt will improve core strength by 1 grade to improve safety and Ind for functional task completion.  Recommendations for other services: None    Skilled Therapeutic Intervention    Patient seen  this day for skilled Occupational Therapy evaluation. The pt presents in good spirits with a willingness to participate. The pt is ModA coming from supine to EOB, he presents as very fatigued  with challenges in maintaining upright positioning while seated EOB unsupported. The pt was unable to come into stand during the assessment secondary to medical concerns.The pt was able to transfer using the sliding board with ModA transitioning to MinA. The pt completes UB task in relation to bathing and dressing with s/uA, he is ModA to Max with LE for threading his limb through his clothing items and bring them over his hips and bottom. He is Dep for non-skid socks and shoes.  The pt would benefit from 10 -14 days of Occupational Therapy to address his residual deficits for a safe and independent r return home to reduce the burden of care for the care person.  Modification to be made in the POC as needed.   ADL ADL Eating: Set up Where Assessed-Eating: Bed level Grooming: Setup Where  Assessed-Grooming: Edge of bed Upper Body Bathing: Setup Where Assessed-Upper Body Bathing: Edge of bed Lower Body Bathing: Moderate assistance;Dependent Where Assessed-Lower Body Bathing: Edge of bed Upper Body Dressing: Setup Where Assessed-Upper Body Dressing: Edge of bed Lower Body Dressing: Setup;Minimal assistance Where Assessed-Lower Body Dressing: Edge of bed;Bed level Toileting: Minimal assistance;Dependent Where Assessed-Toileting: Bed level Toilet Transfer: Moderate assistance Toilet Transfer Method: Conservation officer, historic buildings Transfer: Moderate assistance Tub/Shower Transfer Method:  (SBT) Tub/Shower Equipment: Grab bars;Shower seat with back (based on sliding board transfer) Film/video editor: Moderate assistance Film/video editor Method: Best boy: Grab bars;Shower seat with back Mobility  Bed Mobility Bed Mobility: Rolling Right;Rolling Left;Supine to Sit;Sit to Supine Rolling Right: Minimal Assistance - Patient > 75%;Contact Guard/Touching assist Rolling Left: Minimal Assistance - Patient > 75%;Contact Guard/Touching assist Supine to Sit: Moderate Assistance - Patient 50-74% Sit to Supine: Moderate Assistance - Patient 50-74%   Discharge Criteria: Patient will be discharged from OT if patient refuses treatment 3 consecutive times without medical reason, if treatment goals not met, if there is a change in medical status, if patient makes no progress towards goals or if patient is discharged from hospital.  The above assessment, treatment plan, treatment alternatives and goals were discussed and mutually agreed upon: by patient and by family  Elvera JONETTA Mace 02/29/2024, 2:52 PM

## 2024-02-29 NOTE — Progress Notes (Signed)
 Occupational Therapy Session Note  Patient Details  Name: Levi [name] MRN: [id number] Date of Birth: 1945-11-05  Today's Date: 02/29/2024 OT Individual Time: 1045-1200 OT Individual Time Calculation (min): 75 min    Short Term Goals: Week 1:  OT Short Term Goal 1 (Week 1): The pt will safely complete LB bathing/dressing with ModI incorporating AE as needed. OT Short Term Goal 2 (Week 1): The pt will safely complete all functional t/f 's using a sliding board at [employer] OT Short Term Goal 3 (Week 1): The pt will safely participate in > 30 minutes of Occupational Therapy activity with minimal rest breaks. OT Short Term Goal 4 (Week 1): The pt will improve core strength by 1 grade to improve safety and Ind for functional task completion.  Skilled Therapeutic Interventions/Progress Updates:  The pt resting in bed at the time of arrival with family present. The pt went on to complete UB exercises for effective carryover at bed level of function following demonstration and vc's using resistive band for shld flex, horizontal abduction, elbow extension 1 set of 13 with rest breaks as needed. The pt required 3 rest breaks .  The pt went on to complete a simulated task in UB dressing using theraband 3x  following demonstration and vc's.  The pt was instructed in donn the theraband for simulated task in LB dress by threading the band on his R residual limb first and the incorporating the LLE .  The pt was instructed to remove the LLE first when doffing the theraband.  The pt received instruction for going up in bed incorporating the bed rails for additional leverage for repositioning up in bed with effective carry over at [facility].  The pt was able to complete towel exercises for shld flexion, shld rotation, horizontal abduction and lg circle 1 set of 10 with rest breaks as needed following demonstration and initial cues for performance at w/c LOF. the pt required 3 rest breaks. At the end of the session, the call  light and bedside table were put in place with all additional needs addressed prior to exiting the room.    Balance/vestibular training;Self Care/advanced ADL retraining;Therapeutic Activities;UE/LE Coordination activities;Therapeutic Exercise;DME/adaptive equipment instruction;UE/LE Strength taining/ROM;Functional mobility training   Therapy Documentation Precautions:  Precautions Precautions: Fall Recall of Precautions/Restrictions: Intact Precaution/Restrictions Comments: R BKA Restrictions Weight Bearing Restrictions Per Provider Order: No Other Position/Activity Restrictions: chronic R BKA, does not have prosthetic yet but was supposed to pick up from [facility] this week, MRSA General: General Chart Reviewed: Yes Family/Caregiver Present: No Vital Signs: Therapy Vitals Pulse Rate: 95 BP: 126/67 Patient Position (if appropriate): Lying Oxygen Therapy SpO2: 94 % O2 Device: Room Air Pain: Pain Assessment Pain Score: 8  Pain Type: Chronic pain Pain Location: Shoulder Pain Orientation: Right Pain Descriptors / Indicators: Aching Pain Intervention(s): Medication (See eMAR) Multiple Pain Sites: Yes 2nd Pain Site Pain Type: Chronic pain Pain Location: Knee Pain Orientation: Right Pain Descriptors / Indicators: Aching Pain Frequency: Occasional Pain Intervention(s): Medication (See eMAR) ADL: ADL Eating: Set up Where Assessed-Eating: Bed level Grooming: Setup Where Assessed-Grooming: Edge of bed Upper Body Bathing: Setup Where Assessed-Upper Body Bathing: Edge of bed Lower Body Bathing: Moderate assistance, Dependent Where Assessed-Lower Body Bathing: Edge of bed Upper Body Dressing: Setup Where Assessed-Upper Body Dressing: Edge of bed Lower Body Dressing: Setup, Minimal assistance Where Assessed-Lower Body Dressing: Edge of bed, Bed level Toileting: Minimal assistance, Dependent Where Assessed-Toileting: Bed level Toilet Transfer: Moderate assistance Toilet  Transfer Method: Fish farm manager  Tub/Shower Transfer: Moderate assistance Tub/Shower Transfer Method:  (SBT) Tub/Shower Equipment: Grab bars, Shower seat with back (based on sliding board transfer) Film/video editor: Moderate assistance Film/video editor Method: Best boy: Grab bars, Shower seat with back Vision Baseline Vision/History: 1 Wears glasses Patient Visual Report: Diplopia (readers) Vision Assessment?: Vision impaired- to be further tested in functional context Perception  Perception: Within Functional Limits Praxis Praxis: [facility] Balance Balance Balance Assessed: Yes Static Sitting Balance Static Sitting - Balance Support: Bilateral upper extremity supported Static Sitting - Level of Assistance: 5: Stand by assistance;4: Min assist Dynamic Sitting Balance Dynamic Sitting - Balance Support: Bilateral upper extremity supported Dynamic Sitting - Level of Assistance: 4: Min assist Sitting balance - Comments: right lateral lean Exercises:   Other Treatments:     Therapy/Group: Individual Therapy  Elvera JONETTA Mace 02/29/2024, 3:30 PM

## 2024-02-29 NOTE — Progress Notes (Signed)
 Initial Nutrition Assessment  DOCUMENTATION CODES:  Not applicable  INTERVENTION:  Multivitamin w/ minerals daily Juven po BID, each packet provides 95 calories, 2.5 grams of protein (collagen), and 9.8 grams of carbohydrate (3 grams sugar); also contains 7 grams of L-arginine and L-glutamine, 300 mg vitamin C, 15 mg vitamin E, 1.2 mcg vitamin B-12, 9.5 mg zinc , 200 mg calcium , and 1.5 g  Calcium  Beta-hydroxy-Beta-methylbutyrate to support wound healing 30 ml ProSource Plus BID, each supplement provides 100 kcals and 15 grams protein.  Encourage good PO intake Ok for family to bring outside food if will help with PO intake  NUTRITION DIAGNOSIS:  Increased nutrient needs related to wound healing, chronic illness as evidenced by estimated needs.  GOAL:  Patient will meet greater than or equal to 90% of their needs  MONITOR:  PO intake, Supplement acceptance, Skin, Weight trends  REASON FOR ASSESSMENT:  Consult Assessment of nutrition requirement/status, Diet education, Wound healing  ASSESSMENT:  79 y.o. male admitted to rehab after admitted to acute care for sepsis from pneumonia. PMH includes GERD, HTN, COPD, BPH, HLD, and R foot osteomyelitis s/p R BKA.   10/3 - Admitted   RD working remotely at time of assessment. RD consulted due to poor wound healing in amputation. Pt previous evaluated by RD team during last admission to CIR, pt agreeable to Juven and ProSource during that admission. Discussed with RN, reports that he has only ate 25% this morning and has been in and out of therapy today.   Meal Intake 10/4 - 25% breakfast  Pt with significant weight change since admission, suspect inaccurate.  Admission Weight: 83.6 kg Current Weight: 73.5 kg  Nutrition Related Medications: Augmentin , Ferrous Gluconate , Melatonin, Protonix , Miralax , Prednisone , Senna, IV cubicin Labs: reviewed    UOP: 525 mL x 24 hrs   NUTRITION - FOCUSED PHYSICAL EXAM: Deferred to follow-up    Diet Order:   Diet Order             Diet regular Fluid consistency: Thin  Diet effective now                  EDUCATION NEEDS:  Not appropriate for education at this time  Skin:  Skin Assessment: Reviewed RN Assessment - Stage 2 Buttocks - Stage 1 sacrum  Last BM:  10/4  Height:  Ht Readings from Last 1 Encounters:  02/27/24 6' 2 (1.88 m)   Weight:  Wt Readings from Last 1 Encounters:  02/29/24 73.5 kg   Ideal Body Weight:  86.4 kg  BMI:  Body mass index is 20.8 kg/m.  Estimated Nutritional Needs:  Kcal:  2100-2300 Protein:  110-130 grams Fluid:  >/= 2 L   Nestora Glatter RD, LDN Clinical Dietitian

## 2024-02-29 NOTE — Progress Notes (Signed)
 PROGRESS NOTE   Subjective/Complaints:  Pt doing well again today, slept well, pain doing fine, LBM yesterday per documentation but small however the pt doesn't recall this one-- thinks it's been a couple days. Prior documentation of BM was 9/30. Urinating fine. No other complaints or concerns. Nursing reported last night he seemed a bit confused, but more alert this morning per staff-- PT noting that his cognitive status seems to diminish as he becomes more fatigued.   ROS: as per HPI. Denies CP, SOB, abd pain, N/V/D/C, or any other complaints at this time.    Objective:   US  EKG SITE RITE Result Date: 02/27/2024 If Site Rite image not attached, placement could not be confirmed due to current cardiac rhythm.  Recent Labs    02/27/24 0509 02/28/24 0412  WBC 11.8* 11.0*  HGB 10.0* 9.9*  HCT 31.0* 31.1*  PLT 225 236   Recent Labs    02/27/24 0509 02/28/24 0412  NA 139 139  K 3.5 3.5  CL 103 102  CO2 26 25  GLUCOSE 89 117*  BUN 13 13  CREATININE 0.61 0.57*  CALCIUM  8.6* 8.1*        Intake/Output Summary (Last 24 hours) at 02/29/2024 1055 Last data filed at 02/29/2024 0809 Gross per 24 hour  Intake 100.16 ml  Output 550 ml  Net -449.84 ml     Wound 02/24/24 0441 Pressure Injury Sacrum Mid Stage 1 -  Intact skin with non-blanchable redness of a localized area usually over a bony prominence. (Active)     Wound 02/24/24 0442 Pressure Injury Buttocks Left Stage 2 -  Partial thickness loss of dermis presenting as a shallow open injury with a red, pink wound bed without slough. (Active)    Physical Exam: Vital Signs Blood pressure 126/83, pulse (!) 50, temperature 97.6 F (36.4 C), temperature source Oral, resp. rate 18, height 6' 2 (1.88 m), weight 73.5 kg, SpO2 95%.  Physical Exam Vitals and nursing note reviewed.  Constitutional: No apparent distress. Appropriate appearance for age. Up in w/c finishing PT.  Comfortable.  HENT: No JVD. Neck Supple. Trachea midline. Atraumatic, normocephalic. Eyes: PERRLA. EOMI. Visual fields grossly intact.  Cardiovascular: reg rhythm, bradycardic, no murmurs/rub/gallops. No LE Edema.  Respiratory: CTAB. No rales, rhonchi, or wheezing. On RA.  Abdomen: + bowel sounds, normoactive. No distention or tenderness.   Ext: R BKA Neuro: A&O today   PRIOR EXAMS: Skin: Papery skin with diffuse ecchymotic and flaking BUE. RUE with evidence of third spacing and dependent edema around elbow.  LLE with multiple scabs covered with foam dressing.  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  Pressure area with scab on tibial prominence.     MSK:      + R BKA       + L second toe amputation       + Right shoulder range of motion 0 degrees in flexion, abduction; right shoulder visibly protruding forward from the socket.  No crepitus with passive range of motion.       Neurologic exam:  Cognition: AAO to person, place, time and event.  Language: Fluent, No substitutions or neoglisms. No dysarthria. Names 3/3  objects correctly.  Memory: Recalls 3/3 objects at 5 minutes. No apparent deficits  Insight: Good  insight into current condition.  Mood: Pleasant affect, appropriate mood.  Sensation: To light reduced in left foot and bilateral fingertips in stocking glove pattern.  + Difficulty with proprioception in right hand digits 3 through 5   Reflexes: 2+ in BL UE and LEs. Negative Hoffman's and babinski signs bilaterally.  CN: 2-12 grossly intact.  Coordination: No apparent tremors. No ataxia  Spasticity: MAS 0 in all extremities.       Strength:                RUE: 0/5 SA, 2/5 EF, 3/5 EE, 4/5 WE, 4/5 FF, 4/5 FA                LUE:  4/5 SA, 4/5 EF, 4/5 EE, 4/5 WE, 4/5 FF, 4/5 FA                RLE: 4/5 HF, 5/5 KE                 LLE:  4/5 HF, 5/5 KE, 3/5  DF, 3/5  EHL, 2/5  PF  Assessment/Plan: 1. Functional deficits which require 3+ hours per day of  interdisciplinary therapy in a comprehensive inpatient rehab setting. Physiatrist is providing close team supervision and 24 hour management of active medical problems listed below. Physiatrist and rehab team continue to assess barriers to discharge/monitor patient progress toward functional and medical goals  Care Tool:  Bathing              Bathing assist       Upper Body Dressing/Undressing Upper body dressing        Upper body assist      Lower Body Dressing/Undressing Lower body dressing            Lower body assist       Toileting Toileting    Toileting assist       Transfers Chair/bed transfer  Transfers assist           Locomotion Ambulation   Ambulation assist   Ambulation activity did not occur: Safety/medical concerns          Walk 10 feet activity   Assist  Walk 10 feet activity did not occur: Safety/medical concerns        Walk 50 feet activity   Assist Walk 50 feet with 2 turns activity did not occur: Safety/medical concerns         Walk 150 feet activity   Assist Walk 150 feet activity did not occur: Safety/medical concerns         Walk 10 feet on uneven surface  activity   Assist Walk 10 feet on uneven surfaces activity did not occur: Safety/medical concerns         Wheelchair     Assist Is the patient using a wheelchair?: Yes Type of Wheelchair: Manual    Wheelchair assist level: Supervision/Verbal cueing      Wheelchair 50 feet with 2 turns activity    Assist        Assist Level: Supervision/Verbal cueing   Wheelchair 150 feet activity     Assist      Assist Level: Supervision/Verbal cueing   Blood pressure 126/83, pulse (!) 50, temperature 97.6 F (36.4 C), temperature source Oral, resp. rate 18, height 6' 2 (1.88 m), weight 73.5 kg, SpO2 95%.  Medical Problem List and Plan: 1. Functional deficits secondary  to debility secondary to MRSA bacteremia d/t pneumonia              -patient may shower             -ELOS/Goals: 14-16 days, SPV PT/OT              -Continue CIR - Patient states he has right BKA prosthesis awaiting pickup; would contact [facility] this week to see if it can be brought in. -02/29/24 some concern in his cognitive decline, not frankly confused but certainly declined since prior CIR stay; will order SLP eval   2.  Antithrombotics: -DVT/anticoagulation:  Pharmaceutical: Eliquis  5mg  BID             -antiplatelet therapy: N/A 3. Pain Management: Oxycodone  prn for severe pain. Tylenol  prn for  mild pain.  4. Mood/Behavior/Sleep: LCSW to follow for evaluation and support.              --trazodone  prn for insomnia. Melatonin 5mg  nightly             -antipsychotic agents: N/A 5. Neuropsych/cognition: This patient is capable of making decisions on his own behalf. 6. Skin/Wound Care:  Routine pressure relief measures.    7. Fluids/Electrolytes/Nutrition:  Monitor I/O. Monitor labs. Check CK qMon.   -GERD: protonix  40mg  BID  -02/28/24 CMP stable 8. MRSA bacteremia: On Daptomycin X 4 weeks with EOT 03/22/24 --Ck-119 on 09/29. Recheck on Monday.   --Recurrent leucocytosis 13.7-->9.4-->11.8 -02/28/24 WBC 11.0 today, monitor.  9. Indurated area left buttock/cellulitis?: Monitor for worsening. Augmentin  added on 09/30 w/ antibiotic duration to continue pending improvement.  --Repeat imaging in case of worsening to rule out abscess formation.   10. A fib: Monitor HR TID--on Eliquis . HR controlled on cardizem  180mg  daily.   11. HFmrEF: Monitor daily weights and for signs of overload.             --On Lipitor 20mg  daily, Eliquis --off Imdur  due to soft BP.    -02/28/24 added daily weights order  -02/29/24 wt down 10kg but highly doubt accuracy; monitor for trend Filed Weights   02/27/24 1510 02/29/24 0422  Weight: 83.6 kg 73.5 kg    11. COPD: On chronic prednisone  20 mg (unable to tolerate taper) -continue Brovana  BID with duo nebs prn.  12.  Hypokalemia: Being supplemented intermittently. Low normal currently. 10/4 3.5, monitor 13. Hypomagnesemia: Improved with supplementation. Was 1.6-->2.0             -02/28/24 Mg 2.0 today, monitor.  14. Constipation: Last BM 09/30--on senna-->increase to 2 tabs daily and continue miralax  BID.             --MOM today. Increase as needed.  -02/29/24 LBM yesterday small, pt doesn't recall it, remain on current regimen for now and monitor output.    15.  Neuropathy: Followed by [facility].  --On Lyrica  75 mg TID --off duloxetine .  -- of note, ordered as 150mg  daily +75mg  nightly   16. Anemia: Continue iron  supplement.  17. H/o BPH: Foley remains in place-->work on bowel regimen today and remove foley to start voiding trial tomorrow. Continue Flomax  0.4mg  nightly  -02/28/24 foley out this morning, nursing to monitor PVRs  -02/29/24 good UOP and low PVRs, cont monitoring   18.  Right shoulder rotator cuff tear.  Last [facility] - Has outpatient CT shoulder ordered; would consider getting while he is inpatient to help plan outpatient surgery             -  No formal weightbearing precautions    LOS: 2 days A FACE TO FACE EVALUATION WAS PERFORMED  [address] 02/29/2024, 10:55 AM

## 2024-02-29 NOTE — Plan of Care (Signed)
  Problem: RH Balance Goal: LTG Patient will maintain dynamic sitting balance (PT) Description: LTG:  Patient will maintain dynamic sitting balance with assistance during mobility activities (PT) Flowsheets (Taken 02/29/2024 1239) LTG: Pt will maintain dynamic sitting balance during mobility activities with:: Supervision/Verbal cueing Goal: LTG Patient will maintain dynamic standing balance (PT) Description: LTG:  Patient will maintain dynamic standing balance with assistance during mobility activities (PT) Flowsheets (Taken 02/29/2024 1239) LTG: Pt will maintain dynamic standing balance during mobility activities with:: Minimal Assistance - Patient > 75%   Problem: Sit to Stand Goal: LTG:  Patient will perform sit to stand with assistance level (PT) Description: LTG:  Patient will perform sit to stand with assistance level (PT) Flowsheets (Taken 02/29/2024 1239) LTG: PT will perform sit to stand in preparation for functional mobility with assistance level: Contact Guard/Touching assist   Problem: RH Bed Mobility Goal: LTG Patient will perform bed mobility with assist (PT) Description: LTG: Patient will perform bed mobility with assistance, with/without cues (PT). Flowsheets (Taken 02/29/2024 1239) LTG: Pt will perform bed mobility with assistance level of: Supervision/Verbal cueing   Problem: RH Bed to Chair Transfers Goal: LTG Patient will perform bed/chair transfers w/assist (PT) Description: LTG: Patient will perform bed to chair transfers with assistance (PT). Flowsheets (Taken 02/29/2024 1239) LTG: Pt will perform Bed to Chair Transfers with assistance level: Supervision/Verbal cueing   Problem: RH Car Transfers Goal: LTG Patient will perform car transfers with assist (PT) Description: LTG: Patient will perform car transfers with assistance (PT). Flowsheets (Taken 02/29/2024 1239) LTG: Pt will perform car transfers with assist:: Minimal Assistance - Patient > 75%   Problem: RH  Wheelchair Mobility Goal: LTG Patient will propel w/c in controlled environment (PT) Description: LTG: Patient will propel wheelchair in controlled environment, # of feet with assist (PT) Flowsheets (Taken 02/29/2024 1239) LTG: Pt will propel w/c in controlled environ  assist needed:: Independent with assistive device LTG: Propel w/c distance in controlled environment: 150 feet Goal: LTG Patient will propel w/c in home environment (PT) Description: LTG: Patient will propel wheelchair in home environment, # of feet with assistance (PT). Flowsheets (Taken 02/29/2024 1239) LTG: Pt will propel w/c in home environ  assist needed:: Supervision/Verbal cueing LTG: Propel w/c distance in home environment: 75 feet

## 2024-02-29 NOTE — Evaluation (Signed)
 Physical Therapy Assessment and Plan  Patient Details  Name: Levi [name] MRN: [id number] Date of Birth: [birth year]  PT Diagnosis: Cognitive deficits, Difficulty walking, Impaired cognition, Impaired sensation, Low back pain, and Muscle weakness Rehab Potential: Fair ELOS: 10-14 days   Today's Date: 02/29/2024 PT Individual Time: 0800-0915 PT Individual Time Calculation (min): 75 min    Hospital Problem: Principal Problem:   Debility   Past Medical History:  Past Medical History:  Diagnosis Date   Agent orange exposure 1970's   takes Imdur ,Diltiazem , and Atacand  daily   Agent orange exposure    ALLERGIC RHINITIS    Allergy     seasonal allergies   Anemia    iron  deficiency   Arthritis    Asthma    uses inhaler and nebulizers   Atrial fibrillation (HCC)    Bruises easily    d/t prednisone  daily   Cataract    Cellulitis 03/22/2020   Chest wall pain 09/21/2019   L scapula, reported 4/27 21/     CHF (congestive heart failure) (HCC)    dx-on meds to treat   Chronic bronchitis (HCC)    used to get it q yr; last time ?2008 (04/28/2012)   Clotting disorder 2019   On blood thinner when I bleed it is for a consideral amount of time. This is tegardless of size of wound.   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 2010   History of total right hip  arthroplasty 07/31/2015   Hyperlipidemia    takes Lipitor daily; pt. states he takes a preventive   Hypothyroidism    Joint pain    Joint swelling    Leukocytosis    Lumbar stenosis status post T12-S1 fusion 04/07/2017   MSSA bacteremia 03/19/2023   Nausea and vomiting 11/12/2022   Neuromuscular disorder (HCC)    bilat legs, and bilat arms   Neuropathy    Osteoarthritis of left knee 01/10/2012   Osteoporosis 2012   PAF (paroxysmal atrial fibrillation) (HCC)    a. Dx 04/2012, CHA2DS2VASc = 1 (age);  b. 04/2012 Echo: EF 40-50%, mild MR.   Personal history of colonic adenomas 02/09/2013   Pneumonia    Primary osteoarthritis of right knee 06/17/2014   Sepsis (HCC) 03/18/2023   Shortness of breath dyspnea    Thyroid  disease    no taking meds-caused dizziness   Past Surgical History:  Past Surgical History:  Procedure Laterality Date   58 HOUR PH STUDY N/A 05/30/2021   Procedure: 24 HOUR PH STUDY;  Surgeon: San Sandor GAILS, DO;  Location: [facility];  Service: Gastroenterology;  Laterality: N/A;   AMPUTATION Right 11/14/2023   Procedure: AMPUTATION BELOW KNEE;  Surgeon: Harden Jerona GAILS, MD;  Location: [facility];  Service: Orthopedics;  Laterality: Right;   AMPUTATION TOE Bilateral 08/13/2023   Procedure: LEFT SECOND TOE AMPUTATION AND RIGHT FIFTH TOE AMPUTATION;  Surgeon: Malvin Marsa FALCON, DPM;  Location: [facility];  Service: Orthopedics/Podiatry;  Laterality: Bilateral;   ANTERIOR CERVICAL DECOMP/DISCECTOMY FUSION N/A 07/21/2012   Procedure: ANTERIOR CERVICAL DECOMPRESSION/DISCECTOMY FUSION 2 LEVELS;  Surgeon: Victory Gens, MD;  [facility]: [facility];  Service: Neurosurgery;  Laterality: N/A;  C4-5 C5-6 Anterior cervical decompression/diskectomy/fusion   ANTERIOR LAT LUMBAR FUSION N/A 04/07/2017   Procedure: Thoracic twelve-Lumbar one Anterolateral decompression/fusion;  Surgeon: Gens Victory, MD;  Location: [facility];  Service: Neurosurgery;  Laterality: N/A;   ARTHRODESIS FOOT  WITH WEIL OSTEOTOMY Left 11/07/2022   Procedure: LEFT THIRD METATARSOPHALANGEAL CAPSULOTOMY, WEIL OSTEOTOMY;  Surgeon: Elsa Lonni SAUNDERS, MD;  Location: [facility] OR;  Service: Orthopedics;  Laterality: Left;  LENGTH OF SURGERY: 60 MINUTES   BACK SURGERY     x 3   BELOW KNEE LEG AMPUTATION Right    June 2025   BONE EXOSTOSIS EXCISION Right 10/16/2023   Procedure: EXCISION, EXOSTOSIS;  Surgeon: Janit Thresa HERO, DPM;  Location: [facility];  Service: Orthopedics/Podiatry;  Laterality: Right;   CARDIAC CATHETERIZATION  11/2008   Dr. Jeffrie - 20% calcified non flow limiting left main, 50% EF apical hypokinesis   CARPAL TUNNEL RELEASE Bilateral    CERVICAL DISC ARTHROPLASTY N/A 04/07/2017   Procedure: Cervical six-seven Disc arthroplasty;  Surgeon: Gens Victory, MD;  Location: [facility] OR;  Service: Neurosurgery;  Laterality: N/A;   CHEST TUBE INSERTION Left 04/11/2017   Procedure: CHEST TUBE INSERTION;  Surgeon: Fleeta Hanford Coy, MD;  Location: [facility] OR;  Service: Thoracic;  Laterality: Left;   CHOLECYSTECTOMY     COLONOSCOPY  2016   CG-MAC-miralax (good)servere TICS/TA x 2   ESOPHAGEAL MANOMETRY N/A 05/30/2021   Procedure: ESOPHAGEAL MANOMETRY (EM);  Surgeon: San Sandor GAILS, DO;  Location: [facility];  Service: Gastroenterology;  Laterality: N/A;  ph impedence   ESOPHAGOGASTRODUODENOSCOPY     EYE SURGERY Bilateral 2011   steroidal encapsulation (04/28/2012)   FOOT SURGERY Right    x 2   JOINT REPLACEMENT     Many surgeries including 5 joit replacements   KNEE ARTHROSCOPY Bilateral    LATERAL / POSTERIOR COMBINED FUSION LUMBAR SPINE  2012   LEFT ATRIAL APPENDAGE OCCLUSION N/A 02/22/2021   Procedure: LEFT ATRIAL APPENDAGE OCCLUSION;  Surgeon: Cindie Ole DASEN, MD;  [facility];  Service: Cardiovascular;  Laterality: N/A;   NEUROPLASTY / TRANSPOSITION ULNAR NERVE AT ELBOW Bilateral    POLYPECTOMY  2016   severe TICS/TA x 2   POSTERIOR CERVICAL LAMINECTOMY WITH MET- RX Right  06/11/2021   Procedure: Right Cervical six-seven  Laminectomy and foraminotomy with metrex;  Surgeon: Gens Victory, MD;  [facility];  Service: Neurosurgery;  Laterality: Right;   REFRACTIVE SURGERY Left 07/07/2023   laser surgery but still has floaters   REVERSE SHOULDER ARTHROPLASTY  04/28/2012   Procedure: REVERSE SHOULDER ARTHROPLASTY;  Surgeon: Eva Elsie Herring, MD;  Location: [facility];  Service: Orthopedics;  Laterality: Left;  Left shouder reverse total shoulder arthroplasty   SHOULDER SURGERY     SPINE SURGERY     I have had 7 spinal surgeries   TEE WITHOUT CARDIOVERSION N/A 02/22/2021   Procedure: TRANSESOPHAGEAL ECHOCARDIOGRAM (TEE);  Surgeon: Cindie Ole DASEN, MD;  Location: [facility] LAB;  Service: Cardiovascular;  Laterality: N/A;   TENDON REPAIR Right 08/07/2020   Procedure: RIGHT HAND  LIGAMENT RECONSTRUCTION AND TENDON INTERPOSITION;  Surgeon: Yvone Rush, MD;  Location: [facility];  Service: Orthopedics;  Laterality: Right;   TONSILLECTOMY AND ADENOIDECTOMY  1954   TOTAL HIP ARTHROPLASTY Left 2011   left (04/28/2012)   TOTAL HIP ARTHROPLASTY Right 07/31/2015   Procedure: TOTAL HIP ARTHROPLASTY ANTERIOR APPROACH;  Surgeon: Rush Yvone, MD;  [facility];  Service: Orthopedics;  Laterality: Right;   TOTAL KNEE ARTHROPLASTY  01/10/2012   Procedure: TOTAL KNEE ARTHROPLASTY;  Surgeon: Rush LITTIE Yvone, MD;  [facility];  Service: Orthopedics;;  left total knee arthroplasty   TOTAL KNEE ARTHROPLASTY Right 06/17/2014   Procedure: TOTAL KNEE ARTHROPLASTY;  Surgeon: Rush LITTIE Yvone, MD;  [facility];  Service: Orthopedics;  Laterality: Right;   toupet fundolplication  11/01/2021   TRANSESOPHAGEAL ECHOCARDIOGRAM (CATH LAB) N/A 03/24/2023   Procedure: TRANSESOPHAGEAL ECHOCARDIOGRAM;  Surgeon: Mona Vinie BROCKS, MD;  [facility];  Service: Cardiovascular;  Laterality: N/A;   TRANSESOPHAGEAL ECHOCARDIOGRAM (CATH LAB) N/A 02/25/2024    Procedure: TRANSESOPHAGEAL ECHOCARDIOGRAM;  Surgeon: Jeffrie Oneil BROCKS, MD;  [facility];  Service: Cardiovascular;  Laterality: N/A;    Assessment & Plan Clinical Impression: Patient is a 78 y.o. year old male with  history of HTN, PAF- on eliquis , GERD, HFmrEF (Dr. COPD- steroid dependent, right foot osteomyelitis s/p R-BKA (CIR 6/22-7/11/25), chronic sacral decub, peripheral neuropathy with recent falls, recent admission for PNA 08/25 as well as issues with fall with right hip pain/mild concussion, R-shoulder pain  who was admitted on 02/21/24 with generalized weakness with malaise and poor intake, fevers, non healing wound right medial stump and reports of recent URI with ongoing cough despite course of antibiotics. He was noted have soft BP, lactic acidosis and LLL consolidation concerning for sepsis due to PNA. Foley was placed due to concerns of retention?   He was started on IVF, broad spectrum antibiotics and BC done revealing MRSA.  Dr. Overton consulted and recommended transitioning to Daptomycin as well as TEE which revealing EF 50-55% with mild MVR, AVS without stenosis and negative for shunt, thrombus or endocarditis. He was noted to have left buttock pian with erythema and CT pelvis ordered which showed diffuse SQ edema with induration soft tissue left buttock , fractures of superior and inferior pubic rami subacute or chronic with extensive demineralization but no gas, fluid collection or abscess. Augmentin  added by Dr. Evelynn due to concerns of possible abscess formation. Foley remains in place. PT/OT consulted and patient requires mod assist with transfers. CIR recommended due to functional decline.     Patient currently requires mod with mobility secondary to muscle weakness and muscle joint tightness, decreased cardiorespiratoy endurance, decreased problem solving and decreased safety awareness, and decreased sitting balance, decreased standing balance, decreased postural  control, and decreased balance strategies.  Prior to hospitalization, patient was supervision with mobility and lived with Spouse in a House home.  Home access is  Ramped entrance.  Patient will benefit from skilled PT intervention to maximize safe functional mobility, minimize fall risk, and decrease caregiver burden for planned discharge home with intermittent assist.  Anticipate patient will benefit from follow up [facility] at discharge.  PT - End of Session Activity Tolerance: Tolerates 30+ min activity with multiple rests Endurance Deficit: Yes PT Assessment Rehab Potential (ACUTE/IP ONLY): Fair PT Barriers to Discharge: Wound Care;Decreased caregiver support PT Barriers to Discharge Comments: pain, endurance deficits PT Patient demonstrates impairments in the following area(s): Balance;Endurance;Motor;Pain;Safety;Skin Integrity;Sensory PT Transfers  Functional Problem(s): Bed Mobility;Bed to Chair;Car;Furniture PT Locomotion Functional Problem(s): Ambulation;Wheelchair Mobility PT Plan PT Intensity: Minimum of 1-2 x/day ,45 to 90 minutes PT Frequency: 5 out of 7 days PT Duration Estimated Length of Stay: 10-14 days PT Treatment/Interventions: Ambulation/gait training;Community reintegration;DME/adaptive equipment instruction;Neuromuscular re-education;Psychosocial support;Stair training;UE/LE Strength taining/ROM;Wheelchair propulsion/positioning;Balance/vestibular training;Discharge planning;Functional electrical stimulation;Pain management;Skin care/wound management;Therapeutic Activities;UE/LE Coordination activities;Cognitive remediation/compensation;Disease management/prevention;Functional mobility training;Patient/family education;Splinting/orthotics;Therapeutic Exercise;Visual/perceptual remediation/compensation PT Transfers Anticipated Outcome(s): supervision PT Locomotion Anticipated Outcome(s): mod I PT Recommendation Recommendations for Other Services: Speech consult;Therapeutic  Recreation consult Therapeutic Recreation Interventions: Stress management Follow Up Recommendations: Home health PT Patient destination: Home Equipment Details: pt has RW, WC, hosptial bed, slide board from previous CIR stay   PT Evaluation Precautions/Restrictions Precautions Precautions: Fall Recall of Precautions/Restrictions: Intact Precaution/Restrictions Comments: R BKA Restrictions Weight Bearing Restrictions Per Provider Order: No Other Position/Activity Restrictions: chronic R BKA, does not have prosthetic yet but was supposed to pick up from [employer] this week, MRSA Pain Interference Pain Interference Pain Effect on Sleep: 2. Occasionally Pain Interference with Therapy Activities: 1. Rarely or not at all Pain Interference with Day-to-Day Activities: 3. Frequently Home Living/Prior Functioning Home Living Available Help at Discharge: Family;Available 24 hours/day Type of Home: House Home Access: Ramped entrance Home Layout: Able to live on main level with bedroom/bathroom Bathroom Shower/Tub: Health visitor: Handicapped height Bathroom Accessibility: Yes Additional Comments: pt reports he was supposed to get a prosthetic for RLE next week  Lives With: Spouse Prior Function Level of Independence: Needs assistance with ADLs;Needs assistance with tranfers;Other (comment) Toileting: Moderate Dressing: Moderate  Able to Take Stairs?: No Driving: No Vocation: Retired Optometrist - History Ability to See in Adequate Light: 0 Adequate Perception Perception: Within Functional Limits Praxis Praxis: WFL  Cognition Overall Cognitive Status: Within Functional Limits for tasks assessed Arousal/Alertness: Awake/alert Orientation Level: Oriented X4 Year: 2025 Month: October Day of Week: Correct Memory: Appears intact Awareness: Appears intact Problem Solving: Impaired Safety/Judgment: Impaired Sensation Sensation Light Touch: Impaired  by gross assessment Additional Comments: B LE/UE neuropathy; pt able to detect light touch in all dermatomes L LE however endorses N&T. Pt unable to detect light touch on R BKA. Coordination Gross Motor Movements are Fluid and Coordinated: No Coordination and Movement Description: limited 2/2 R LE BKA, L LE neuropathy, and R UE ROM limited 2/2 rototor cuff injury (surgery on hold per pt) Motor  Motor Motor: Other (comment) Motor - Skilled Clinical Observations: R LE BKA  Trunk/Postural Assessment  Cervical Assessment Cervical Assessment: Exceptions to [facility] (forward head) Thoracic Assessment Thoracic Assessment: Exceptions to [facility] (rounded shoulders) Lumbar Assessment Lumbar Assessment: Exceptions to [facility] (posterior pelvic titl) Postural Control Postural Control: Deficits on evaluation Righting Reactions: delayed Protective Responses: delayed  Balance Balance Balance Assessed: Yes Static Sitting Balance Static Sitting - Balance Support: Bilateral upper extremity supported Static Sitting - Level of Assistance: 5: Stand by assistance;4: Min assist Dynamic Sitting Balance Dynamic Sitting - Level of Assistance: 4: Min assist Extremity Assessment  RLE Assessment RLE Assessment: Exceptions to [facility] General Strength Comments: grossly 3+/5 seated LLE Assessment LLE Assessment: Exceptions to [facility] General Strength Comments: grossly 3+/5 seated, pt demonstrates tendency to invert and adduct L LE  Care Tool Care Tool Bed Mobility Roll left and right activity   Roll left and right assist level: Moderate Assistance - Patient 50 - 74%    Sit to lying activity   Sit to lying assist level: Moderate Assistance - Patient 50 - 74%    Lying  to sitting on side of bed activity   Lying to sitting on side of bed assist level: the ability to move from lying on the back to sitting on the side of the bed with no back support.: Moderate Assistance - Patient 50 - 74%     Care Tool Transfers Sit to stand  transfer Sit to stand activity did not occur: Safety/medical concerns      Chair/bed transfer   Chair/bed transfer assist level: Moderate Assistance - Patient 50 - 74%    Car transfer Car transfer activity did not occur: Safety/medical concerns (fatigue)        Care Tool Locomotion Ambulation Ambulation activity did not occur: Safety/medical concerns        Walk 10 feet activity Walk 10 feet activity did not occur: Safety/medical concerns       Walk 50 feet with 2 turns activity Walk 50 feet with 2 turns activity did not occur: Safety/medical concerns      Walk 150 feet activity Walk 150 feet activity did not occur: Safety/medical concerns      Walk 10 feet on uneven surfaces activity Walk 10 feet on uneven surfaces activity did not occur: Safety/medical concerns      Stairs Stair activity did not occur: Safety/medical concerns        Walk up/down 1 step activity Walk up/down 1 step or curb (drop down) activity did not occur: Safety/medical concerns      Walk up/down 4 steps activity Walk up/down 4 steps activity did not occur: Safety/medical concerns      Walk up/down 12 steps activity Walk up/down 12 steps activity did not occur: Safety/medical concerns      Pick up small objects from floor Pick up small object from the floor (from standing position) activity did not occur: Safety/medical concerns      Wheelchair Is the patient using a wheelchair?: Yes Type of Wheelchair: Manual   Wheelchair assist level: Supervision/Verbal cueing Max wheelchair distance: 75  Wheel 50 feet with 2 turns activity   Assist Level: Supervision/Verbal cueing  Wheel 150 feet activity   Assist Level: Moderate Assistance - Patient 50 - 74%    Refer to Care Plan for Long Term Goals  SHORT TERM GOAL WEEK 1 PT Short Term Goal 1 (Week 1): pt will perform sit to stand with LRAD and mod A PT Short Term Goal 2 (Week 1): PT will contact [employer] for R LE prosthetic delivery and fit to pt PT  Short Term Goal 3 (Week 1): pt will perform bed to chair transfer with min A consistently  Recommendations for other services: Neuropsych and Other: SLP  Skilled Therapeutic Intervention Mobility Bed Mobility Bed Mobility: Rolling Right;Rolling Left;Supine to Sit;Sit to Supine Rolling Right: Moderate Assistance - Patient 50-74% Rolling Left: Moderate Assistance - Patient 50-74% Supine to Sit: Moderate Assistance - Patient 50-74% Sit to Supine: Moderate Assistance - Patient 50-74% Transfers Transfers: Lateral/Scoot Transfers Lateral/Scoot Transfers: Minimal Assistance - Patient > 75%;Moderate Assistance - Patient 50-74% (min-mod A with fatigue) Transfer (Assistive device): Other (Comment) (slide board) Locomotion  Gait Ambulation: No Gait Gait: No Stairs / Additional Locomotion Stairs: No Wheelchair Mobility Wheelchair Mobility: Yes Wheelchair Assistance: Doctor, general practice: Both upper extremities Wheelchair Parts Management: Needs assistance   Discharge Criteria: Patient will be discharged from PT if patient refuses treatment 3 consecutive times without medical reason, if treatment goals not met, if there is a change in medical status, if patient makes no progress towards goals or  if patient is discharged from hospital.  The above assessment, treatment plan, treatment alternatives and goals were discussed and mutually agreed upon: by patient  Today's Interventions  Evaluation completed (see details above and below) with education on PT POC and goals and individual treatment initiated with focus on slide board transfer.   Bed mobility: mod A Seated balance: CGA/min A for strong R Latral trunk lean and posterior bias Slide board transfer: bed to WC min A to R, heavy mod A to L with fatigue  WC mobility: 75 feet with B UE and supervision, verbla cues provided for technique/efficiency with navigating turns/obstacles and for R LE posiitoning on amputee  pad.   PT provided pt with 18x16 standard WC with roho cushion, slide board, R LE amputee pad, and bariatric BSC.   Pt attempted to use bed pan at start of session however unable.   Cognition: pt A&O x4 however with fatigue required max verbal cues for attention to task/problem solving (ex: pt reports need to use bed pan but attempting to pull pants up).      [facility], [employer], DPT  02/29/2024, 12:33 PM

## 2024-03-01 DIAGNOSIS — E876 Hypokalemia: Secondary | ICD-10-CM

## 2024-03-01 DIAGNOSIS — K5900 Constipation, unspecified: Secondary | ICD-10-CM

## 2024-03-01 DIAGNOSIS — R5381 Other malaise: Secondary | ICD-10-CM | POA: Diagnosis not present

## 2024-03-01 DIAGNOSIS — I5033 Acute on chronic diastolic (congestive) heart failure: Secondary | ICD-10-CM | POA: Insufficient documentation

## 2024-03-01 DIAGNOSIS — I5021 Acute systolic (congestive) heart failure: Secondary | ICD-10-CM | POA: Insufficient documentation

## 2024-03-01 DIAGNOSIS — D72829 Elevated white blood cell count, unspecified: Secondary | ICD-10-CM

## 2024-03-01 LAB — CBC
HCT: 30.8 % — ABNORMAL LOW (ref 39.0–52.0)
Hemoglobin: 9.5 g/dL — ABNORMAL LOW (ref 13.0–17.0)
MCH: 27.6 pg (ref 26.0–34.0)
MCHC: 30.8 g/dL (ref 30.0–36.0)
MCV: 89.5 fL (ref 80.0–100.0)
Platelets: 251 K/uL (ref 150–400)
RBC: 3.44 MIL/uL — ABNORMAL LOW (ref 4.22–5.81)
RDW: 16.8 % — ABNORMAL HIGH (ref 11.5–15.5)
WBC: 11.2 K/uL — ABNORMAL HIGH (ref 4.0–10.5)
nRBC: 0 % (ref 0.0–0.2)

## 2024-03-01 LAB — COMPREHENSIVE METABOLIC PANEL WITH GFR
ALT: 26 U/L (ref 0–44)
AST: 17 U/L (ref 15–41)
Albumin: 2 g/dL — ABNORMAL LOW (ref 3.5–5.0)
Alkaline Phosphatase: 78 U/L (ref 38–126)
Anion gap: 11 (ref 5–15)
BUN: 19 mg/dL (ref 8–23)
CO2: 26 mmol/L (ref 22–32)
Calcium: 8.3 mg/dL — ABNORMAL LOW (ref 8.9–10.3)
Chloride: 104 mmol/L (ref 98–111)
Creatinine, Ser: 0.62 mg/dL (ref 0.61–1.24)
GFR, Estimated: >60 mL/min (ref >60)
Glucose, Bld: 90 mg/dL (ref 70–99)
Potassium: 3.4 mmol/L — ABNORMAL LOW (ref 3.5–5.1)
Sodium: 141 mmol/L (ref 135–145)
Total Bilirubin: 0.5 mg/dL (ref 0.0–1.2)
Total Protein: 5 g/dL — ABNORMAL LOW (ref 6.5–8.1)

## 2024-03-01 LAB — CK: Total CK: 19 U/L — ABNORMAL LOW (ref 49–397)

## 2024-03-01 MED ORDER — POTASSIUM CHLORIDE CRYS ER 20 MEQ PO TBCR
30.0000 meq | EXTENDED_RELEASE_TABLET | Freq: Once | ORAL | Status: AC
  Administered 2024-03-01: 30 meq via ORAL
  Filled 2024-03-01: qty 1

## 2024-03-01 MED ORDER — SORBITOL 70 % SOLN
60.0000 mL | Freq: Once | Status: AC
  Administered 2024-03-01: 60 mL via ORAL
  Filled 2024-03-01: qty 60

## 2024-03-01 NOTE — IPOC Note (Addendum)
 Overall Plan of Care [facility]) Patient Details Name: Levi [name] MRN: [id number] DOB: February 24, 1946  Admitting Diagnosis: Debility  Hospital Problems: Principal Problem:   Debility Active Problems:   Acute heart failure with mildly reduced ejection fraction (HFmrEF) (HCC)   Leukocytosis     Functional Problem List: Nursing Bowel, Endurance, Medication Management, Pain, Safety, Skin Integrity  PT Balance, Endurance, Motor, Pain, Safety, Skin Integrity, Sensory  OT Balance, Endurance  SLP    TR         Basic ADL's: OT Bathing, Dressing, Toileting     Advanced  ADL's: OT       Transfers: PT Bed Mobility, Bed to Chair, Car, Occupational psychologist, Research scientist (life sciences): PT Ambulation, Psychologist, prison and probation services     Additional Impairments: OT Fuctional Use of Upper Extremity (RUE for active mobility)  SLP        TR      Anticipated Outcomes Item Anticipated Outcome  Self Feeding ModI  Swallowing      Basic self-care  ModI to [employer]  ModI to [employer] Transfers ModI to MinA  Bowel/Bladder  manage bowel with mod I assist  Transfers  supervision  Locomotion  mod I  Communication     Cognition     Pain  PAin < 4 with prns  Safety/Judgment  manage safety w cues   Therapy Plan: PT Intensity: Minimum of 1-2 x/day ,45 to 90 minutes PT Frequency: 5 out of 7 days PT Duration Estimated Length of Stay: 10-14 days OT Intensity: Minimum of 1-2 x/day, 45 to 90 minutes OT Frequency: 5 out of 7 days OT Duration/Estimated Length of Stay: 10-12 days     Team Interventions: Nursing Interventions Patient/Family Education, Medication Management, Skin Care/Wound Management, Bowel Management, Disease Management/Prevention, Pain Management, Discharge Planning  PT interventions Ambulation/gait training, Community reintegration, DME/adaptive equipment instruction, Neuromuscular re-education, Psychosocial support, Stair training, UE/LE Strength taining/ROM,  Wheelchair propulsion/positioning, Warden/ranger, Discharge planning, Functional electrical stimulation, Pain management, Skin care/wound management, Therapeutic Activities, UE/LE Coordination activities, Cognitive remediation/compensation, Disease management/prevention, Functional mobility training, Patient/family education, Splinting/orthotics, Therapeutic Exercise, Visual/perceptual remediation/compensation  OT Interventions Balance/vestibular training, Self Care/advanced ADL retraining, Therapeutic Activities, UE/LE Coordination activities, Therapeutic Exercise, DME/adaptive equipment instruction, UE/LE Strength taining/ROM, Functional mobility training  SLP Interventions    TR Interventions    SW/CM Interventions Discharge Planning, Psychosocial Support, Patient/Family Education, Disease Management/Prevention   Barriers to Discharge MD  Medical stability  Nursing Decreased caregiver support, Home environment access/layout Household: able to transfer to manual w/c with slideboard mod I, some minimal assist for ADLs prior to recent decline.Home OT, Home PT, Home RN  Home Care Agency (if known): [facility]  Additional Comments: pt reports he was supposed to get a prosthetic for RLE next week  PT Wound Care, Decreased caregiver support pain, endurance deficits  OT      SLP      SW       Team Discharge Planning: Destination: PT-Home ,OT- Home , SLP-Home Projected Follow-up: PT-Home health PT, OT-  Other (comment) (TBD), SLP-None Projected Equipment Needs: PT- , OT- 3 in 1 bedside comode, SLP-None recommended by SLP Equipment Details: PT-pt has RW, WC, hosptial bed, slide board from previous CIR stay, OT-  Patient/family involved in discharge planning: PT- Patient,  OT-Patient, SLP-Patient  MD ELOS: 10-14 Medical Rehab Prognosis:  Excellent Assessment: The patient has been admitted for CIR therapies with the diagnosis of debility secondary to MRSA bacteremia d/t pneumonia .  The team will be addressing functional mobility, strength, stamina, balance, safety, adaptive techniques and equipment, self-care, bowel and bladder mgt, patient and caregiver education. Goals have been set at mod I/SUP. Anticipated discharge destination is home.        See Team Conference Notes for weekly updates to the plan of care

## 2024-03-01 NOTE — Progress Notes (Addendum)
 PROGRESS NOTE   Subjective/Complaints: Patient reports he feels well overall.  Continues to have right shoulder pain this is chronic, overall controlled. LBM 10/4 small and patient does still feel constipated. Therapy reports there was some dizziness yesterday but has not reoccurred today.  ROS: as per HPI. Denies CP, SOB, abd pain, N/V/D, or any other complaints at this time.   + constipation  Objective:   No results found.  Recent Labs    02/28/24 0412 03/01/24 0500  WBC 11.0* 11.2*  HGB 9.9* 9.5*  HCT 31.1* 30.8*  PLT 236 251   Recent Labs    02/28/24 0412 03/01/24 0500  NA 139 141  K 3.5 3.4*  CL 102 104  CO2 25 26  GLUCOSE 117* 90  BUN 13 19  CREATININE 0.57* 0.62  CALCIUM  8.1* 8.3*        Intake/Output Summary (Last 24 hours) at 03/01/2024 1255 Last data filed at 03/01/2024 0800 Gross per 24 hour  Intake 240 ml  Output 600 ml  Net -360 ml     Wound 02/24/24 0441 Pressure Injury Sacrum Mid Stage 1 -  Intact skin with non-blanchable redness of a localized area usually over a bony prominence. (Active)     Wound 02/24/24 0442 Pressure Injury Buttocks Left Stage 2 -  Partial thickness loss of dermis presenting as a shallow open injury with a red, pink wound bed without slough. (Active)    Physical Exam: Vital Signs Blood pressure (!) 143/96, pulse 64, temperature 97.7 F (36.5 C), temperature source Oral, resp. rate 16, height 6' 2 (1.88 m), weight 74.4 kg, SpO2 94%.  Physical Exam Vitals and nursing note reviewed.  Constitutional: No apparent distress. Appropriate appearance for age.  Working with therapy  HENT: No JVD. Neck Supple. Trachea midline. Atraumatic, normocephalic. Eyes: PERRLA. EOMI. Visual fields grossly intact.  Cardiovascular: reg rhythm, bradycardic, no murmurs/rub/gallops.  Respiratory: CTAB. No rales, rhonchi, or wheezing. On RA.  Abdomen: + bowel sounds, normoactive. No  distention or tenderness.   Psych: Pleasant cooperative Ext: R BKA Neuro: A&O x 4, follows commands, fluent   PRIOR EXAMS: Skin: Papery skin with diffuse ecchymotic and flaking BUE. RUE with evidence of third spacing and dependent edema around elbow.  LLE with multiple scabs covered with foam dressing.  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  Pressure area with scab on tibial prominence.     MSK:      + R BKA       + L second toe amputation       + Right shoulder range of motion 0 degrees in flexion, abduction; right shoulder visibly protruding forward from the socket.  No crepitus with passive range of motion.       Neurologic exam:  Cognition: AAO to person, place, time and event.  Language: Fluent, No substitutions or neoglisms. No dysarthria. Names 3/3 objects correctly.  Memory: Recalls 3/3 objects at 5 minutes. No apparent deficits  Insight: Good  insight into current condition.  Mood: Pleasant affect, appropriate mood.  Sensation: To light reduced in left foot and bilateral fingertips in stocking glove pattern.  + Difficulty with proprioception in right hand digits  3 through 5   Reflexes: 2+ in BL UE and LEs. Negative Hoffman's and babinski signs bilaterally.  CN: 2-12 grossly intact.  Coordination: No apparent tremors. No ataxia  Spasticity: MAS 0 in all extremities.       Strength:                RUE: 0/5 SA, 2/5 EF, 3/5 EE, 4/5 WE, 4/5 FF, 4/5 FA                LUE:  4/5 SA, 4/5 EF, 4/5 EE, 4/5 WE, 4/5 FF, 4/5 FA                RLE: 4/5 HF, 5/5 KE                 LLE:  4/5 HF, 5/5 KE, 3/5  DF, 3/5  EHL, 2/5  PF  Assessment/Plan: 1. Functional deficits which require 3+ hours per day of interdisciplinary therapy in a comprehensive inpatient rehab setting. Physiatrist is providing close team supervision and 24 hour management of active medical problems listed below. Physiatrist and rehab team continue to assess barriers to discharge/monitor patient  progress toward functional and medical goals  Care Tool:  Bathing    Body parts bathed by patient: Right arm, Left arm, Chest, Abdomen, Front perineal area, Right upper leg, Left upper leg, Face   Body parts bathed by helper: Buttocks, Right lower leg, Left lower leg     Bathing assist Assist Level: Moderate Assistance - Patient 50 - 74%     Upper Body Dressing/Undressing Upper body dressing   What is the patient wearing?: Pull over shirt    Upper body assist Assist Level: Set up assist    Lower Body Dressing/Undressing Lower body dressing      What is the patient wearing?: Pants     Lower body assist Assist for lower body dressing: Maximal Assistance - Patient 25 - 49%     Toileting Toileting    Toileting assist Assist for toileting: Moderate Assistance - Patient 50 - 74%     Transfers Chair/bed transfer  Transfers assist     Chair/bed transfer assist level: Moderate Assistance - Patient 50 - 74% (ModA  for SBT)     Locomotion Ambulation   Ambulation assist   Ambulation activity did not occur: Safety/medical concerns          Walk 10 feet activity   Assist  Walk 10 feet activity did not occur: Safety/medical concerns        Walk 50 feet activity   Assist Walk 50 feet with 2 turns activity did not occur: Safety/medical concerns         Walk 150 feet activity   Assist Walk 150 feet activity did not occur: Safety/medical concerns         Walk 10 feet on uneven surface  activity   Assist Walk 10 feet on uneven surfaces activity did not occur: Safety/medical concerns         Wheelchair     Assist Is the patient using a wheelchair?: Yes Type of Wheelchair: Manual    Wheelchair assist level: Supervision/Verbal cueing Max wheelchair distance: 75    Wheelchair 50 feet with 2 turns activity    Assist        Assist Level: Supervision/Verbal cueing   Wheelchair 150 feet activity     Assist      Assist  Level: Moderate Assistance - Patient 50 - 74%  Blood pressure (!) 143/96, pulse 64, temperature 97.7 F (36.5 C), temperature source Oral, resp. rate 16, height 6' 2 (1.88 m), weight 74.4 kg, SpO2 94%.  Medical Problem List and Plan: 1. Functional deficits secondary to debility secondary to MRSA bacteremia d/t pneumonia             -patient may shower             -ELOS/Goals: 14-16 days, SPV PT/OT              -Continue CIR - Patient states he has right BKA prosthesis awaiting pickup; would contact [facility] this week to see if it can be brought in. -02/29/24 some concern in his cognitive decline, not frankly confused but certainly declined since prior CIR stay; will order SLP eval   2.  Antithrombotics: -DVT/anticoagulation:  Pharmaceutical: Eliquis  5mg  BID             -antiplatelet therapy: N/A 3. Pain Management: Oxycodone  prn for severe pain. Tylenol  prn for  mild pain.  4. Mood/Behavior/Sleep: LCSW to follow for evaluation and support.              --trazodone  prn for insomnia. Melatonin 5mg  nightly             -antipsychotic agents: N/A 5. Neuropsych/cognition: This patient is capable of making decisions on his own behalf. 6. Skin/Wound Care:  Routine pressure relief measures.    7. Fluids/Electrolytes/Nutrition:  Monitor I/O. Monitor labs. Check CK qMon.   -GERD: protonix  40mg  BID  -02/28/24 CMP stable 8. MRSA bacteremia: On Daptomycin X 4 weeks with EOT 03/22/24 --Ck-119 on 09/29. Recheck on Monday.   --Recurrent leucocytosis 13.7-->9.4-->11.8 -02/28/24 WBC 11.0 today, monitor.  -10/6 WBC 11.2, continue to monitor for signs of infection 9. Indurated area left buttock/cellulitis?: Monitor for worsening. Augmentin  added on 09/30 w/ antibiotic duration to continue pending improvement.  --Repeat imaging in case of worsening to rule out abscess formation.   10. A fib: Monitor HR TID--on Eliquis . HR controlled on cardizem  180mg  daily.   11. HFmrEF: Monitor daily weights and for  signs of overload.             --On Lipitor 20mg  daily, Eliquis --off Imdur  due to soft BP.    -02/28/24 added daily weights order  -02/29/24 wt down 10kg but highly doubt accuracy; monitor for trend  Continue to monitor trend, do suspect it inaccuracy and measurements Filed Weights   02/27/24 1510 02/29/24 0422 03/01/24 0529  Weight: 83.6 kg 73.5 kg 74.4 kg    11. COPD: On chronic prednisone  20 mg (unable to tolerate taper) -continue Brovana  BID with duo nebs prn.  12. Hypokalemia: Being supplemented intermittently. Low normal currently. 10/4 3.5, monitor  -10/6 will order today 13. Hypomagnesemia: Improved with supplementation. Was 1.6-->2.0             -02/28/24 Mg 2.0 today, monitor.  14. Constipation: Last BM 09/30--on senna-->increase to 2 tabs daily and continue miralax  BID.             --MOM today. Increase as needed.  -02/29/24 LBM yesterday small, pt doesn't recall it, remain on current regimen for now and monitor output.  -10/6 Sorbitol 60ml today   15.  Neuropathy: Followed by [facility].  --On Lyrica  75 mg TID --off duloxetine .  -- of note, ordered as 150mg  daily +75mg  nightly   16. Anemia: Continue iron  supplement.  17. H/o BPH: Foley remains in place-->work on bowel regimen today and remove foley  to start voiding trial tomorrow. Continue Flomax  0.4mg  nightly  -02/28/24 foley out this morning, nursing to monitor PVRs  -02/29/24 good UOP and low PVRs, cont monitoring  - 10/6 remains continent PVR 59 and 0, continue to follow   18.  Right shoulder rotator cuff tear.  [facility] - Has outpatient CT shoulder ordered; would consider getting while he is inpatient to help plan outpatient surgery             - No formal weightbearing precautions  19. Azotemia  -Increase BUN  -encourage oral fluids, appears to be off Lasix  currently  Addendum- abd ordered for orthostatic hypotension symptoms, appears to be off Lasix  currently,  encourage oral fluids    LOS: 3 days A FACE TO FACE EVALUATION WAS PERFORMED  Murray Collier 03/01/2024, 12:55 PM

## 2024-03-01 NOTE — Progress Notes (Incomplete)
 Stop date on augmentin  10/8?

## 2024-03-01 NOTE — Progress Notes (Signed)
 Occupational Therapy Session Note  Patient Details  Name: Levi [name] MRN: [id number] Date of Birth: [birth year]  Today's Date: 03/01/2024 OT Individual Time: 0932-1030 OT Individual Time Calculation (min): 58 min    Short Term Goals: Week 1:  OT Short Term Goal 1 (Week 1): The pt will safely complete LB bathing/dressing with ModI incorporating AE as needed. OT Short Term Goal 2 (Week 1): The pt will safely complete all functional t/f 's using a sliding board at [employer] OT Short Term Goal 3 (Week 1): The pt will safely participate in > 30 minutes of Occupational Therapy activity with minimal rest breaks. OT Short Term Goal 4 (Week 1): The pt will improve core strength by 1 grade to improve safety and Ind for functional task completion.  Skilled Therapeutic Interventions/Progress Updates: Patient received sitting up in w/c. Agreeable to OT treatment. Patient appearing very fatigued with w/c propulsion to therapy gym. Needed assist with maintaining path- w/c veering left. Once in therapy gym worked on simple endurance building exercises worked with a 1 lb dowel for simple IR/ER mobility working to maintain functional AAROM of the RUE for dressing and hygiene tasks. Assisted with support at elbow during self ROM within limited range. Continued with LUE therEx using 3 lb hand weight. Sets of 10 in varied planes with significant rest breaks needed between sets. Continued treatment with functional transfer training at the bedside. Patient with good awareness of steps for slide board transfer, but was very weak/fatigued getting back into bed. Required total assist to place slide board and Max/Total assist to transfer to the left using LUE on the rail to pull. Patient able to roll to side and get LE's into the bed. Assist to position in the bed. Wife present, call bell and tray table in reach. Continue with skilled POC.     Therapy Documentation Precautions:  Precautions Precautions: Fall Recall of  Precautions/Restrictions: Intact Precaution/Restrictions Comments: R BKA Restrictions Weight Bearing Restrictions Per Provider Order: No Other Position/Activity Restrictions: chronic R BKA, does not have prosthetic yet but was supposed to pick up from [employer] this week, MRSA General:   Vital Signs:   Pain:Reports pain in R shoulder- mild   ADL: ADL Eating: Set up Where Assessed-Eating: Bed level Grooming: Setup Where Assessed-Grooming: Edge of bed Upper Body Bathing: Setup Where Assessed-Upper Body Bathing: Edge of bed Lower Body Bathing: Moderate assistance, Dependent Where Assessed-Lower Body Bathing: Edge of bed Upper Body Dressing: Setup Where Assessed-Upper Body Dressing: Edge of bed Lower Body Dressing: Setup, Minimal assistance Where Assessed-Lower Body Dressing: Edge of bed, Bed level Toileting: Minimal assistance, Dependent Where Assessed-Toileting: Bed level Toilet Transfer: Moderate assistance Toilet Transfer Method: Conservation officer, historic buildings Transfer: Moderate assistance Tub/Shower Transfer Method:  (SBT) Tub/Shower Equipment: Grab bars, Shower seat with back (based on sliding board transfer) Film/video editor: Moderate assistance Film/video editor Method: Best boy: Grab bars, Shower seat with back    Therapy/Group: Individual Therapy  [name] JONETTA Freund 03/01/2024, 10:28 AM

## 2024-03-01 NOTE — Progress Notes (Signed)
 Inpatient Rehabilitation  Patient information reviewed and entered into eRehab system by [facility]. Karen Kays., CCC/SLP, PPS Coordinator.  Information including medical coding, functional ability and quality indicators will be reviewed and updated through discharge.

## 2024-03-01 NOTE — Progress Notes (Signed)
 Inpatient Rehabilitation Care Coordinator Assessment and Plan Patient Details  Name: Levi [name] MRN: [id number] Date of Birth: [birth year]  Today's Date: 03/01/2024  Hospital Problems: Principal Problem:   Debility  Past Medical History:  Past Medical History:  Diagnosis Date   Agent orange exposure 1970's   takes Imdur ,Diltiazem , and Atacand  daily   Agent orange exposure    ALLERGIC RHINITIS    Allergy     seasonal allergies   Anemia    iron  deficiency   Arthritis    Asthma    uses inhaler and nebulizers   Atrial fibrillation (HCC)    Bruises easily    d/t prednisone  daily   Cataract    Cellulitis 03/22/2020   Chest wall pain 09/21/2019   L scapula, reported 4/27 21/     CHF (congestive heart failure) (HCC)    dx-on meds to treat   Chronic bronchitis (HCC)    used to get it q yr; last time ?2008 (04/28/2012)   Clotting disorder 2019   On blood thinner when I bleed it is for a consideral amount of time. This is tegardless of size of wound.   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 2010   History of total right hip arthroplasty 07/31/2015   Hyperlipidemia    takes Lipitor daily; pt. states he takes a preventive   Hypothyroidism    Joint pain    Joint swelling    Leukocytosis    Lumbar stenosis status post T12-S1 fusion 04/07/2017    MSSA bacteremia 03/19/2023   Nausea and vomiting 11/12/2022   Neuromuscular disorder (HCC)    bilat legs, and bilat arms   Neuropathy    Osteoarthritis of left knee 01/10/2012   Osteoporosis 2012   PAF (paroxysmal atrial fibrillation) (HCC)    a. Dx 04/2012, CHA2DS2VASc = 1 (age);  b. 04/2012 Echo: EF 40-50%, mild MR.   Personal history of colonic adenomas 02/09/2013   Pneumonia    Primary osteoarthritis of right knee 06/17/2014   Sepsis (HCC) 03/18/2023   Shortness of breath dyspnea    Thyroid  disease    no taking meds-caused dizziness   Past Surgical History:  Past Surgical History:  Procedure Laterality Date   5 HOUR PH STUDY N/A 05/30/2021   Procedure: 24 HOUR PH STUDY;  Surgeon: San Sandor GAILS, DO;  Location: [facility];  Service: Gastroenterology;  Laterality: N/A;   AMPUTATION Right 11/14/2023   Procedure: AMPUTATION BELOW KNEE;  Surgeon: Harden Jerona GAILS, MD;  Location: [facility] OR;  Service: Orthopedics;  Laterality: Right;   AMPUTATION TOE Bilateral 08/13/2023   Procedure: LEFT SECOND TOE AMPUTATION AND RIGHT FIFTH TOE AMPUTATION;  Surgeon: Malvin Marsa FALCON, DPM;  Location: [facility] OR;  Service: Orthopedics/Podiatry;  Laterality: Bilateral;   ANTERIOR CERVICAL DECOMP/DISCECTOMY FUSION N/A 07/21/2012   Procedure: ANTERIOR CERVICAL DECOMPRESSION/DISCECTOMY FUSION 2 LEVELS;  Surgeon: Victory Gens, MD;  Location: [facility];  Service: Neurosurgery;  Laterality: N/A;  C4-5 C5-6 Anterior cervical decompression/diskectomy/fusion   ANTERIOR LAT LUMBAR FUSION N/A 04/07/2017   Procedure: Thoracic twelve-Lumbar one Anterolateral decompression/fusion;  Surgeon: Gens Victory, MD;  Location: [facility] OR;  Service: Neurosurgery;  Laterality: N/A;   ARTHRODESIS FOOT WITH WEIL OSTEOTOMY Left 11/07/2022   Procedure: LEFT THIRD METATARSOPHALANGEAL CAPSULOTOMY, WEIL OSTEOTOMY;  Surgeon: Elsa Lonni SAUNDERS, MD;  Location: [facility] OR;  Service: Orthopedics;  Laterality: Left;  LENGTH OF SURGERY: 60  MINUTES   BACK SURGERY     x 3   BELOW KNEE LEG AMPUTATION Right    June 2025   BONE EXOSTOSIS EXCISION Right 10/16/2023   Procedure: EXCISION, EXOSTOSIS;  Surgeon: Janit Thresa HERO, DPM;  Location: [facility];  Service: Orthopedics/Podiatry;  Laterality: Right;   CARDIAC CATHETERIZATION  11/2008   Dr. Jeffrie - 20% calcified non flow limiting left main, 50% EF apical hypokinesis   CARPAL TUNNEL RELEASE Bilateral    CERVICAL DISC ARTHROPLASTY N/A 04/07/2017   Procedure: Cervical six-seven Disc arthroplasty;  Surgeon: Gens Victory, MD;  [facility] OR;  Service: Neurosurgery;  Laterality: N/A;   CHEST TUBE INSERTION Left 04/11/2017   Procedure: CHEST TUBE INSERTION;  Surgeon: Fleeta Hanford Coy, MD;  Location: [facility] OR;  Service: Thoracic;  Laterality: Left;   CHOLECYSTECTOMY     COLONOSCOPY  2016   CG-MAC-miralax (good)servere TICS/TA x 2   ESOPHAGEAL MANOMETRY N/A 05/30/2021   Procedure: ESOPHAGEAL MANOMETRY (EM);  Surgeon: San Sandor GAILS, DO;  Location: [facility];  Service: Gastroenterology;  Laterality: N/A;  ph impedence   ESOPHAGOGASTRODUODENOSCOPY     EYE SURGERY Bilateral 2011   steroidal encapsulation (04/28/2012)   FOOT SURGERY Right    x 2   JOINT REPLACEMENT     Many surgeries including 5 joit replacements   KNEE ARTHROSCOPY Bilateral    LATERAL / POSTERIOR COMBINED FUSION LUMBAR SPINE  2012   LEFT ATRIAL APPENDAGE OCCLUSION N/A 02/22/2021   Procedure: LEFT ATRIAL APPENDAGE OCCLUSION;  Surgeon: Cindie Ole DASEN, MD;  [facility]: [facility];  Service: Cardiovascular;  Laterality: N/A;   NEUROPLASTY / TRANSPOSITION ULNAR NERVE AT ELBOW Bilateral    POLYPECTOMY  2016   severe TICS/TA x 2   POSTERIOR CERVICAL LAMINECTOMY WITH MET- RX Right 06/11/2021   Procedure: Right Cervical six-seven  Laminectomy and foraminotomy with metrex;  Surgeon: Gens Victory, MD;  Location: [facility] OR;  Service: Neurosurgery;  Laterality: Right;   REFRACTIVE SURGERY Left 07/07/2023   laser  surgery but still has floaters   REVERSE SHOULDER ARTHROPLASTY  04/28/2012   Procedure: REVERSE SHOULDER ARTHROPLASTY;  Surgeon: Eva Elsie Herring, MD;  Location: [facility] OR;  Service: Orthopedics;  Laterality: Left;  Left shouder reverse total shoulder arthroplasty   SHOULDER SURGERY     SPINE SURGERY     I have had 7 spinal surgeries   TEE WITHOUT CARDIOVERSION N/A 02/22/2021   Procedure: TRANSESOPHAGEAL ECHOCARDIOGRAM (TEE);  Surgeon: Cindie Ole DASEN, MD;  Location: [facility] INVASIVE CV LAB;  Service: Cardiovascular;  Laterality: N/A;   TENDON REPAIR Right 08/07/2020   Procedure: RIGHT HAND LIGAMENT RECONSTRUCTION AND TENDON INTERPOSITION;  Surgeon: Yvone Rush, MD;  Location: [facility];  Service: Orthopedics;  Laterality: Right;   TONSILLECTOMY AND ADENOIDECTOMY  1954   TOTAL HIP ARTHROPLASTY Left 2011  left (04/28/2012)   TOTAL HIP ARTHROPLASTY Right 07/31/2015   Procedure: TOTAL HIP ARTHROPLASTY ANTERIOR APPROACH;  Surgeon: Norleen Gavel, MD;  Location: [facility] OR;  Service: Orthopedics;  Laterality: Right;   TOTAL KNEE ARTHROPLASTY  01/10/2012   Procedure: TOTAL KNEE ARTHROPLASTY;  Surgeon: Norleen LITTIE Gavel, MD;  Location: [facility] OR;  Service: Orthopedics;;  left total knee arthroplasty   TOTAL KNEE ARTHROPLASTY Right 06/17/2014   Procedure: TOTAL KNEE ARTHROPLASTY;  Surgeon: Norleen LITTIE Gavel, MD;  Location: [facility] OR;  Service: Orthopedics;  Laterality: Right;   toupet fundolplication  11/01/2021   TRANSESOPHAGEAL ECHOCARDIOGRAM (CATH LAB) N/A 03/24/2023   Procedure: TRANSESOPHAGEAL ECHOCARDIOGRAM;  Surgeon: Mona Vinie BROCKS, MD;  Location: [facility];  Service: Cardiovascular;  Laterality: N/A;   TRANSESOPHAGEAL ECHOCARDIOGRAM (CATH LAB) N/A 02/25/2024   Procedure: TRANSESOPHAGEAL ECHOCARDIOGRAM;  Surgeon: Jeffrie Oneil BROCKS, MD;  Location: [facility];  Service: Cardiovascular;  Laterality: N/A;   Social History:  reports that he has never smoked. He has never used  smokeless tobacco. He reports that he does not drink alcohol and does not use drugs.  Family / Support Systems Marital Status: Married How Long?: 53 years Patient Roles: Spouse Spouse/Significant Other: [name] Children: Ginnie Anticipated Caregiver: [name] Ability/Limitations of Caregiver: Can provide some physical assist for transfers and ADLs if needed Caregiver Availability: 24/7 Family Dynamics: Good  Social History Preferred language: English Religion: Catholic Education: High school Health Literacy - How often do you need to have someone help you when you read instructions, pamphlets, or other written material from your doctor or pharmacy?: Never Writes: Yes Employment Status: Retired Age Retired: 63   Abuse/Neglect Abuse/Neglect Assessment Can Be Completed: Yes Physical Abuse: Denies Verbal Abuse: Denies Sexual Abuse: Denies Exploitation of patient/patient's resources: Denies Self-Neglect: Denies  Patient response to: Social Isolation - How often do you feel lonely or isolated from those around you?: Never  Emotional Status Pt's affect, behavior and adjustment status: Patient is adjusting to therapy Recent Psychosocial Issues: None Psychiatric History: None Substance Abuse History: None  Patient / Family Perceptions, Expectations & Goals Pt/Family understanding of illness & functional limitations: Patient/family understandingof illness & functional limitations Premorbid pt/family roles/activities: Active within the community Anticipated changes in roles/activities/participation: None anticipated Pt/family expectations/goals: Patient/family have realistic expectations and goals  [employer]: None Premorbid Home Care/DME Agencies: Other (Comment) (Hospital bed, wheelchair and slide board) Transportation available at discharge: Yes, [name] Is the patient able to respond to transportation needs?: Yes In the past 12 months, has lack of  transportation kept you from medical appointments or from getting medications?: No In the past 12 months, has lack of transportation kept you from meetings, work, or from getting things needed for daily living?: No  Discharge Planning Living Arrangements: Spouse/significant other Support Systems: Spouse/significant other Type of Residence: Private residence Insurance Resources: Media planner (specify) Financial Resources: Other (Comment), SSD Financial Screen Referred: No Living Expenses: Own Money Management: Patient, Spouse Does the patient have any problems obtaining your medications?: No Home Management: Patient/Spouse manage home Patient/Family Preliminary Plans: Patient plans to return home with spouse Care Coordinator Anticipated Follow Up Needs: HH/OP DC Planning Additional Notes/Comments: Set up with [name] PT/OT/SN Expected length of stay: 14-16 days  Clinical Impression CSW met with patient/family to introduce herself and complete initial assessment. Patient is [employer] and able to make all needs known. Mr. Pfiester is a 78 y/o male who was admitted to [facility] due to new BKA. DME: Hospital bed, wheelchair and  slide board in the home. 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 There were no further needs or concerns at present. CSW will follow up with family and continue to follow.    Di'Asia  Loreli 03/01/2024, 10:56 AM

## 2024-03-01 NOTE — Evaluation (Incomplete)
 Speech Language Pathology Assessment and Plan  Patient Details  Name: CAMERAN PETTEY MRN: [id number] Date of Birth: 11-24-1945  SLP Diagnosis:   n/a Rehab Potential:  n/a ELOS:   n/a  Today's Date: 03/02/2024 SLP Individual Time: 9169-9079 SLP Individual Time Calculation (min): 50 min and Today's Date: 03/02/2024 SLP Missed Time: 10 Minutes Missed Time Reason:  (Cognitive-Linguistic Evaluation completed in above stated time and no speech needs identified, thus services discontinued)  Hospital Problem: Principal Problem:   Debility Active Problems:   Acute heart failure with mildly reduced ejection fraction (HFmrEF) (HCC)   Leukocytosis  Past Medical History:  Past Medical History:  Diagnosis Date   Agent orange exposure 1970's   takes Imdur ,Diltiazem , and Atacand  daily   Agent orange exposure    ALLERGIC RHINITIS    Allergy     seasonal allergies   Anemia    iron  deficiency   Arthritis    Asthma    uses inhaler and nebulizers   Atrial fibrillation (HCC)    Bruises easily    d/t prednisone  daily   Cataract    Cellulitis 03/22/2020   Chest wall pain 09/21/2019   L scapula, reported 4/27 21/     CHF (congestive heart failure) (HCC)    dx-on meds to treat   Chronic bronchitis (HCC)    used to get it q yr; last time ?2008 (04/28/2012)   Clotting disorder 2019   On blood thinner when I bleed it is for a consideral amount of time. This is tegardless of size of wound.   COPD (chronic obstructive pulmonary disease) (HCC)    agent orange exposure   Degenerative disk disease    everywhere (04/28/2012)   Depression due to physical illness 12/15/2023   Diverticulitis    Dysrhythmia    afib   Elevated uric acid in blood    takes Allopurinol  daily   Emphysema of lung (HCC)    Enlarged prostate    but not on any meds   Essential (primary) hypertension 11/15/2020   GERD (gastroesophageal reflux disease)    takes Protonix  daily   H/O hiatal hernia    Headache    History  of colonic polyps 02/09/2013   01/2013 - 3 adenomas max 8 mm  01/2015 2 diminutive adenomas - repeat colon 2021     History of colonoscopy    History of pneumonia    a. 2010   History of total right hip arthroplasty 07/31/2015   Hyperlipidemia    takes Lipitor daily; pt. states he takes a preventive   Hypothyroidism    Joint pain    Joint swelling    Leukocytosis    Lumbar stenosis status post T12-S1 fusion 04/07/2017   MSSA bacteremia 03/19/2023   Nausea and vomiting 11/12/2022   Neuromuscular disorder (HCC)    bilat legs, and bilat arms   Neuropathy    Osteoarthritis of left knee 01/10/2012   Osteoporosis 2012   PAF (paroxysmal atrial fibrillation) (HCC)    a. Dx 04/2012, CHA2DS2VASc = 1 (age);  b. 04/2012 Echo: EF 40-50%, mild MR.   Personal history of colonic adenomas 02/09/2013   Pneumonia    Primary osteoarthritis of right knee 06/17/2014   Sepsis (HCC) 03/18/2023   Shortness of breath dyspnea    Thyroid  disease    no taking meds-caused dizziness   Past Surgical History:  Past Surgical History:  Procedure Laterality Date   42 HOUR PH STUDY N/A 05/30/2021   Procedure: 24 HOUR PH STUDY;  Surgeon: San Sandor GAILS, DO;  Location: [facility];  Service: Gastroenterology;  Laterality: N/A;   AMPUTATION Right 11/14/2023   Procedure: AMPUTATION BELOW KNEE;  Surgeon: Harden Jerona GAILS, MD;  [facility];  Service: Orthopedics;  Laterality: Right;   AMPUTATION TOE Bilateral 08/13/2023   Procedure: LEFT SECOND TOE AMPUTATION AND RIGHT FIFTH TOE AMPUTATION;  Surgeon: Malvin Marsa FALCON, DPM;  Location: [facility] OR;  Service: Orthopedics/Podiatry;  Laterality: Bilateral;   ANTERIOR CERVICAL DECOMP/DISCECTOMY FUSION N/A 07/21/2012   Procedure: ANTERIOR CERVICAL DECOMPRESSION/DISCECTOMY FUSION 2 LEVELS;  Surgeon: Victory Gens, MD;  Location: [facility];  Service: Neurosurgery;  Laterality: N/A;  C4-5 C5-6 Anterior cervical decompression/diskectomy/fusion   ANTERIOR LAT LUMBAR  FUSION N/A 04/07/2017   Procedure: Thoracic twelve-Lumbar one Anterolateral decompression/fusion;  Surgeon: Gens Victory, MD;  Location: [facility];  Service: Neurosurgery;  Laterality: N/A;   ARTHRODESIS FOOT WITH WEIL OSTEOTOMY Left 11/07/2022   Procedure: LEFT THIRD METATARSOPHALANGEAL CAPSULOTOMY, WEIL OSTEOTOMY;  Surgeon: Elsa Lonni SAUNDERS, MD;  Location: [facility] OR;  Service: Orthopedics;  Laterality: Left;  LENGTH OF SURGERY: 60 MINUTES   BACK SURGERY     x 3   BELOW KNEE LEG AMPUTATION Right    June 2025   BONE EXOSTOSIS EXCISION Right 10/16/2023   Procedure: EXCISION, EXOSTOSIS;  Surgeon: Janit Thresa HERO, DPM;  Location: [facility];  Service: Orthopedics/Podiatry;  Laterality: Right;   CARDIAC CATHETERIZATION  11/2008   Dr. Jeffrie - 20% calcified non flow limiting left main, 50% EF apical hypokinesis   CARPAL TUNNEL RELEASE Bilateral    CERVICAL DISC ARTHROPLASTY N/A 04/07/2017   Procedure: Cervical six-seven Disc arthroplasty;  Surgeon: Gens Victory, MD;  Location: [facility] OR;  Service: Neurosurgery;  Laterality: N/A;   CHEST TUBE INSERTION Left 04/11/2017   Procedure: CHEST TUBE INSERTION;  Surgeon: Fleeta Hanford Coy, MD;  Location: [facility] OR;  Service: Thoracic;  Laterality: Left;   CHOLECYSTECTOMY     COLONOSCOPY  2016   CG-MAC-miralax (good)servere TICS/TA x 2   ESOPHAGEAL MANOMETRY N/A 05/30/2021   Procedure: ESOPHAGEAL MANOMETRY (EM);  Surgeon: San Sandor GAILS, DO;  Location: [facility];  Service: Gastroenterology;  Laterality: N/A;  ph impedence   ESOPHAGOGASTRODUODENOSCOPY     EYE SURGERY Bilateral 2011   steroidal encapsulation (04/28/2012)   FOOT SURGERY Right    x 2   JOINT REPLACEMENT     Many surgeries including 5 joit replacements   KNEE ARTHROSCOPY Bilateral    LATERAL / POSTERIOR COMBINED FUSION LUMBAR SPINE  2012   LEFT ATRIAL APPENDAGE OCCLUSION N/A 02/22/2021   Procedure: LEFT ATRIAL APPENDAGE OCCLUSION;  Surgeon: Cindie Ole DASEN, MD;  [facility];   Service: Cardiovascular;  Laterality: N/A;   NEUROPLASTY / TRANSPOSITION ULNAR NERVE AT ELBOW Bilateral    POLYPECTOMY  2016   severe TICS/TA x 2   POSTERIOR CERVICAL LAMINECTOMY WITH MET- RX Right 06/11/2021   Procedure: Right Cervical six-seven  Laminectomy and foraminotomy with metrex;  Surgeon: Gens Victory, MD;  [facility];  Service: Neurosurgery;  Laterality: Right;   REFRACTIVE SURGERY Left 07/07/2023   laser surgery but still has floaters   REVERSE SHOULDER ARTHROPLASTY  04/28/2012   Procedure: REVERSE SHOULDER ARTHROPLASTY;  Surgeon: Eva Elsie Herring, MD;  Location: [facility] OR;  Service: Orthopedics;  Laterality: Left;  Left shouder reverse total shoulder arthroplasty   SHOULDER SURGERY     SPINE SURGERY     I have had 7 spinal surgeries   TEE WITHOUT CARDIOVERSION N/A 02/22/2021  Procedure: TRANSESOPHAGEAL ECHOCARDIOGRAM (TEE);  Surgeon: Cindie Ole DASEN, MD;  Location: [facility] INVASIVE CV LAB;  Service: Cardiovascular;  Laterality: N/A;   TENDON REPAIR Right 08/07/2020   Procedure: RIGHT HAND LIGAMENT RECONSTRUCTION AND TENDON INTERPOSITION;  Surgeon: Yvone Rush, MD;  Location: [facility];  Service: Orthopedics;  Laterality: Right;   TONSILLECTOMY AND ADENOIDECTOMY  1954   TOTAL HIP ARTHROPLASTY Left 2011   left (04/28/2012)   TOTAL HIP ARTHROPLASTY Right 07/31/2015   Procedure: TOTAL HIP ARTHROPLASTY ANTERIOR APPROACH;  Surgeon: Rush Yvone, MD;  [facility];  Service: Orthopedics;  Laterality: Right;   TOTAL KNEE ARTHROPLASTY  01/10/2012   Procedure: TOTAL KNEE ARTHROPLASTY;  Surgeon: Rush LITTIE Yvone, MD;  Location[facility];  Service: Orthopedics;;  left total knee arthroplasty   TOTAL KNEE ARTHROPLASTY Right 06/17/2014   Procedure: TOTAL KNEE ARTHROPLASTY;  Surgeon: Rush LITTIE Yvone, MD;  [facility];  Service: Orthopedics;  Laterality: Right;   toupet fundolplication  11/01/2021   TRANSESOPHAGEAL ECHOCARDIOGRAM (CATH LAB) N/A 03/24/2023    Procedure: TRANSESOPHAGEAL ECHOCARDIOGRAM;  Surgeon: Mona Vinie BROCKS, MD;  [facility];  Service: Cardiovascular;  Laterality: N/A;   TRANSESOPHAGEAL ECHOCARDIOGRAM (CATH LAB) N/A 02/25/2024   Procedure: TRANSESOPHAGEAL ECHOCARDIOGRAM;  Surgeon: Jeffrie Oneil BROCKS, MD;  [facility];  Service: Cardiovascular;  Laterality: N/A;    Assessment / Plan / Recommendation Clinical Impression  Pt is a 78 y/o male with PMH of HTN, HLD, PAF, HFmrEF, depression, COPD, R BKA (no prosthetic yet), and peripheral neuropathy who was admitted to [facility] on 9/27 for c/o weakness, fever, and cough. Temp with EMS was 100.4, SBP 90s, noted to have stage 1 sacral wound and skin tear to left thigh, non healing wound to medial aspect of R residual limb, and mild swelling of L hallux. Recently completed abx course for URI symptoms. In ED WBC 13.7, hgb 9.2 down from 10.8 on previous labs, potassium 3, mag 1.6, creatinine 1.1, calcium  8.2, albumin 2.1. Viral panel negative. Chest xray showed left lower lobe consolidation concerning for PNA. Blood cultures positive for MRSA and ID consulted. Chest CT on 9/29 showed increasing atelectasis or infiltrate of the lung bases with trace right pleural effusion and small left pleural effusion. ID recommended daptomyocin x4 weeks. Added augmentin  on 9/30 for concern for abscess in left buttock. TTE and TEE negative for vegetations. Hospital course AKI, ongoing hypokalemia, wound care. Therapy ongoing and recommendations are for CIR.    Cognitive-Linguistic Evaluation: Patient presents with cognitive-linguistic function that is adequate for current venue and likely not far from baseline, though discussion with wife reveals patient's memory has been declining over time with most overt deficits present during periods of fatigue. SLP administered Cognitstat to assess overall cognitive function. Patient scored WNL for orientation, attention, repetition, naming, short  term memory, working memory, similarities, and judgement and had mild impairments in [place] representative ability. Longer term memory appears intact during case history however patient's family not present to attest to cognitive baseline. Patient expressed difficulty with short term memory prior to admission to CIR. Wife also notes that prior to admission, patient exhibited several instances of memory lapse despite multiple repetitions of information, especially in functional settings such as running and errands and attending MD appointments. Given that these deficits have been present prior to admission, discussed choices with wife of starting SLP services inpatient vs. Obtaining outpatient neurological evaluation prior to initiating services, as wife seems to desire diagnosis that is causing these  memory changes. Wife notes that while patient is in inpatient rehabilitation, she would like to see PT/OT prioritized and notes patient currently has a neurology appointment scheduled to assess these changes in cognition. SLP appreciates that these present cognitive deficits impact new learning, especially considering patient's need to acquire new skills such as prosthetic management and wheelchair navigation, and SLP offered techniques to wife re: helpful cues for carryover and memory of education acquired across therapy services. Wife notes she plans to discuss progress with PT prior to making decision re: adding on speech services during inpatient stay. Oral mech not formally conducted however noted no overt abnormalities present with facial or mouth symmetry and RO during speech tasks. Receptive/expressive language appears WFL. No further SLP services indicated at this venue of care given baseline status and wife's stated preferences.    Skilled Therapeutic Interventions          SLP conducted skilled evaluation session to assess cognitive-linguistic function. Utilized Cognistat and patient and/or family interview. Full  results above.   SLP Assessment  Patient does not need any further Speech Lanaguage Pathology Services    Recommendations  Patient destination: Home Follow up Recommendations: None Equipment Recommended: None recommended by SLP    SLP Frequency   N/a  SLP Duration  SLP Intensity  SLP Treatment/Interventions  N/a   N/a    N/a   Pain Pain Assessment Pain Scale: Faces Faces Pain Scale: No hurt  Prior Functioning Cognitive/Linguistic Baseline: Information not available Type of Home: House  Lives With: Spouse Available Help at Discharge: Family;Available 24 hours/day Vocation: Retired  Architectural technologist Overall Cognitive Status: No family/caregiver present to determine baseline cognitive functioning Arousal/Alertness: Awake/alert Orientation Level: Oriented X4 Year: 2025 Month: October Day of Week: Correct Attention: Sustained Sustained Attention: Appears intact Memory: Appears intact Awareness: Appears intact Problem Solving: Impaired Problem Solving Impairment: Functional complex Safety/Judgment: Appears intact  Comprehension Auditory Comprehension Overall Auditory Comprehension: Appears within functional limits for tasks assessed Expression Expression Primary Mode of Expression: Verbal Verbal Expression Overall Verbal Expression: Appears within functional limits for tasks assessed Oral Motor Oral Motor/Sensory Function Overall Oral Motor/Sensory Function: Within functional limits Motor Speech Overall Motor Speech: Appears within functional limits for tasks assessed Respiration: Within functional limits Phonation: Normal Resonance: Within functional limits Articulation: Within functional limitis Intelligibility: Intelligible Motor Planning: Within functional limits Motor Speech Errors: Not applicable  Care Tool Care Tool Cognition Ability to hear (with hearing aid or hearing appliances if normally used Ability to hear (with hearing aid or  hearing appliances if normally used): 0. Adequate - no difficulty in normal conservation, social interaction, listening to TV   Expression of Ideas and Wants Expression of Ideas and Wants: 4. Without difficulty (complex and basic) - expresses complex messages without difficulty and with speech that is clear and easy to understand   Understanding Verbal and Non-Verbal Content Understanding Verbal and Non-Verbal Content: 4. Understands (complex and basic) - clear comprehension without cues or repetitions  Memory/Recall Ability Memory/Recall Ability : Current season;Location of own room   Intelligibility: Intelligible  The above assessment, treatment plan, treatment alternatives and goals were discussed and mutually agreed upon: by patient  Jerolene Kupfer A Chrystal Zeimet 03/02/2024, 9:28 AM

## 2024-03-01 NOTE — Progress Notes (Signed)
 Inpatient Rehabilitation Center Individual Statement of Services  Patient Name:  Levi [name]  Date:  03/01/2024  Welcome to the Inpatient Rehabilitation Center.  Our goal is to provide you with an individualized program based on your diagnosis and situation, designed to meet your specific needs.  With this comprehensive rehabilitation program, you will be expected to participate in at least 3 hours of rehabilitation therapies Monday-Friday, with modified therapy programming on the weekends.  Your rehabilitation program will include the following services:  Physical Therapy (PT), Occupational Therapy (OT), Speech Therapy (ST), 24 hour per day rehabilitation nursing, Therapeutic Recreaction (TR), Care Coordinator, Rehabilitation Medicine, Nutrition Services, and Pharmacy Services  Weekly team conferences will be held on Wednesday to discuss your progress.  Your Inpatient Rehabilitation Care Coordinator will talk with you frequently to get your input and to update you on team discussions.  Team conferences with you and your family in attendance may also be held.  Expected length of stay: 14-16 Days  Overall anticipated outcome: Independent with assistive device   Depending on your progress and recovery, your program may change. Your Inpatient Rehabilitation Care Coordinator will coordinate services and will keep you informed of any changes. Your Inpatient Rehabilitation Care Coordinator's name and contact numbers are listed  below.  The following services may also be recommended but are not provided by the Inpatient Rehabilitation Center:  Driving Evaluations Home Health Rehabiltiation Services Outpatient Rehabilitation Services Vocational Rehabilitation   Arrangements will be made to provide these services after discharge if needed.  Arrangements include referral to agencies that provide these services.  Your insurance has been verified to be:  MEDICARE / MEDICARE PART A AND B  Your primary  doctor is:  No PCP, working to establish one   Pertinent information will be shared with your doctor and your insurance company.  Inpatient Rehabilitation Care Coordinator:  [name] [phone] or [name]  Information discussed with and copy given to patient by: [name], 03/01/2024, 10:59 AM

## 2024-03-01 NOTE — Progress Notes (Addendum)
 Physical Therapy Session Note  Patient Details  Name: Levi [name] MRN: [id number] Date of Birth: 07/13/1945  Today's Date: 03/01/2024 PT Individual Time: 0800-0858, [id number] PT Individual Time Calculation (min): 58 min, 71 min  Short Term Goals: Week 1:  PT Short Term Goal 1 (Week 1): pt will perform sit to stand with LRAD and mod A PT Short Term Goal 2 (Week 1): PT will contact [employer] for R LE prosthetic delivery and fit to pt PT Short Term Goal 3 (Week 1): pt will perform bed to chair transfer with min A consistently  Skilled Therapeutic Interventions/Progress Updates:      Treatment Session 1  Pt supine in bed upon arrival. Pt agreeable to therapy. Pt endorses 6/10 R shoulder pain, nurse present at start of session to administer pain medication.   PT reached out to [place], representative from [employer] to see if he can deliver and fit prosthetic to pt.   Doffed shrinker.Of note: Pt has red spot on tibial tuberosity, and scarred area on medial aspect of residual limb. Nurse aware. PT requested pt wife bring in additional shrinkers from home.   Pt required total A for donning/doffing shrinker. Pt required mod A overall for donning pants (pt required assistance for feeding through L LE, and donning over buttocks while in bed. Pt performed rolling R and L with use of bed rail and min A. Pt performed supine to sit and sit to supine with min A overall with ++ time, and verbal cues provided for sequencing.   Treatment session focused on static/dynamic seated balance, postural control/lateral weight shifting for improved independence for mobility:   Pt performed slide board transfer bed to WC, WC to mat table, mat table to [facility] on level surfaces to R with min A and increased time, PT placing slide board. Max instructional cues provided for sequencing and head hip ratio.   Pt performed the following activities while seated EOM:   Lateral trunk lean x10 to L  [place] twists x10 B  with 1#  ball  Bicep curls with B UE support on   Education provided regarding importance of performing pressure relief for skin intergrity. Initiated education on frequency and technique both in bed and in [facility] with emphasis on rolling side to side in bed and performing lateral trunk lean in WC. Will continue to work on pt recall and implementation of this during stay.   Pt seated in WC at end of session with all needs within reach and bed alarm on.    Treatment Session 2   Pt supine in bed upon arrival. Pt agreeable to therapy. Pt wife present and endorses pt feeling dizzy after OT session this AM.  Assessed vitals:   Supine BP 108/58 HR 47   Reassessed in supine: BP 105/69 HR 87, SpO2 92 asymptomatic   Seated EOB: BP 99/66 HR 106 SpO2 94 symptomatic -pt endorsing lightheadedness/dizziness; donned L LE ted hose and performed seated therex seated EOB however pt endorses no change 5/10 dizziness.   PT provided pt with new roho cushion as pt current cushion having air leak.   Pt performed the following therex for seated balance/activity tolerance/endurance:   1x10 seated LAQ B  1x10 seated marching B   1x10 L LE heel/toe raises  Pt endorses feeling better with laying supine.   Encouraged fluids at end of session.   Notified IDT.   Pt supine in bed with all needs within reach and bed alarm on.  Therapy Documentation Precautions:  Precautions Precautions: Fall Recall of Precautions/Restrictions: Intact Precaution/Restrictions Comments: R BKA Restrictions Weight Bearing Restrictions Per Provider Order: No Other Position/Activity Restrictions: chronic R BKA, does not have prosthetic yet but was supposed to pick up from [employer] this week, MRSA  Therapy/Group: Individual Therapy  [facility] [place], [place], DPT  03/01/2024, 8:02 AM

## 2024-03-01 NOTE — Progress Notes (Signed)
 Patient ID: Levi [name], male   DOB: 09/03/1945, 78 y.o.   MRN: [id number]  Statement of service delivered.

## 2024-03-02 DIAGNOSIS — I951 Orthostatic hypotension: Secondary | ICD-10-CM

## 2024-03-02 DIAGNOSIS — I5022 Chronic systolic (congestive) heart failure: Secondary | ICD-10-CM

## 2024-03-02 NOTE — Progress Notes (Signed)
 Nutrition Follow Up  DOCUMENTATION CODES:  Non-severe (moderate) malnutrition in context of chronic illness  INTERVENTION:  Multivitamin w/ minerals daily  Juven po BID, each packet provides 95 calories, 2.5 grams of protein (collagen), and 9.8 grams of carbohydrate (3 grams sugar); also contains 7 grams of L-arginine and L-glutamine, 300 mg vitamin C, 15 mg vitamin E, 1.2 mcg vitamin B-12, 9.5 mg zinc , 200 mg calcium , and 1.5 g  Calcium  Beta-hydroxy-Beta-methylbutyrate to support wound healing  30 ml ProSource Plus BID, each supplement provides 100 kcals and 15 grams protein.   Encourage good PO intake  Ok for family to bring outside food if will help with PO intake  NUTRITION DIAGNOSIS:  Moderate Malnutrition related to chronic illness as evidenced by mild fat depletion, moderate muscle depletion. New diagnosis  GOAL:  Patient will meet greater than or equal to 90% of their needs Progressing  MONITOR:  PO intake, Supplement acceptance, Weight trends  REASON FOR ASSESSMENT:  Consult Assessment of nutrition requirement/status, Diet education, Wound healing  ASSESSMENT:  78 y.o. male admitted to rehab after admitted to acute care for sepsis from pneumonia. PMH includes GERD, HTN, COPD, BPH, HLD, and R foot osteomyelitis s/p R BKA.   10/3 - Admitted   Spoke with pt who was resting in bedside wheelchair. Wife also at bedside. Pt endorses fair appetite, states it has gotten better each day. Diet documentation shows pt consuming on average 67% of last 5 meals. Pt endorses continued intake of ProSource Plus and Juven and is acceptable to continue supplements. Pt had large bm yesterday and states he feels better and less constipated. Nutrition focused physical exam shows mild fat depletions and moderate muscle depletions, indicative of malnutrition. Suspect malnutrition in the context of chronic illness and has been ongoing issue. Per chart review, pt has lost 22% wt since June, but  suspect fluid retention has been ongoing problem so it is difficult to know pt's actual dry wt prior to wt loss. Based on physical findings, pt only meets criteria for moderate malnutrition, although wt loss appears more significant. Will continue to trend wts and assess for further loss.   PTA, pt reports eating 2x per day and usually skips breakfast. Pt reports he does not eat much meat, usually will only eat chicken or some tuna but otherwise avoids meat. Pt states he would occasionally eat beans for protein. Lunch and dinner consisted of a variety including: pizza, sandwiches, vegetables, grilled cheese.   Pt endorses he feels like strength is improving as he continues to work with PT/OT. Encouraged continued adequate intake of meals and supplements to support wound healing and meet calorie/protein needs.   Average Meal Completion: 10/4-10/7: 67% average intake x 5 recorded meals  Admission Weight: 83.6 kg Current Weight: 75.1 kg *suspect wt changes related to lasix  treatment which has just been discontinued in combination with possible inaccurate admission wt*  Nutrition Related Medications: Prosource Plus BID Fergon MVI w/ minerals  Juven BID Protonix   Miralax  Senna  Labs:  Potassium 3.4<--3.5  UOP: 775 mL x 24 hrs   NUTRITION - FOCUSED PHYSICAL EXAM: *only observed L calf since pt has R BKA* Flowsheet Row Most Recent Value  Orbital Region Mild depletion  Upper Arm Region Moderate depletion  Thoracic and Lumbar Region No depletion  Buccal Region Mild depletion  Temple Region Moderate depletion  Clavicle Bone Region Moderate depletion  Clavicle and Acromion Bone Region Moderate depletion  Scapular Bone Region Moderate depletion  Dorsal Hand Moderate depletion  Patellar Region Moderate depletion  Anterior Thigh Region Moderate depletion  Posterior Calf Region Moderate depletion  Edema (RD Assessment) None  Hair Reviewed  Eyes Reviewed  Mouth Reviewed  Skin Reviewed   Nails Reviewed     Diet Order:   Diet Order             Diet regular Fluid consistency: Thin  Diet effective now                  EDUCATION NEEDS:  Education needs have been addressed  Skin:  Skin Assessment: Reviewed RN Assessment - Stage 2 Buttocks - Stage 1 sacrum  Last BM:  10/6 type 5  Height:  Ht Readings from Last 1 Encounters:  02/27/24 6' 2 (1.88 m)   Weight:  Wt Readings from Last 1 Encounters:  03/02/24 75.1 kg    BMI:  Body mass index is 21.26 kg/m.  Estimated Nutritional Needs:  Kcal:  2100-2300 Protein:  110-130 grams Fluid:  >/= 2 L   Josette Glance, MS, RDN, LDN Clinical Dietitian I Please reach out via secure chat

## 2024-03-02 NOTE — Progress Notes (Signed)
 PROGRESS NOTE   Subjective/Complaints: Patient reports he is feeling well overall this morning.  No new concerns or complaints.  Nursing and SLP in his room.  ROS: as per HPI. Denies CP, SOB, abd pain, N/V/D, or any other complaints at this time.   + constipation + Right shoulder pain chronic Objective:   No results found.  Recent Labs    03/01/24 0500  WBC 11.2*  HGB 9.5*  HCT 30.8*  PLT 251   Recent Labs    03/01/24 0500  NA 141  K 3.4*  CL 104  CO2 26  GLUCOSE 90  BUN 19  CREATININE 0.62  CALCIUM  8.3*        Intake/Output Summary (Last 24 hours) at 03/02/2024 1320 Last data filed at 03/02/2024 0730 Gross per 24 hour  Intake 1090 ml  Output 775 ml  Net 315 ml     Wound 02/24/24 0441 Pressure Injury Sacrum Mid Stage 1 -  Intact skin with non-blanchable redness of a localized area usually over a bony prominence. (Active)     Wound 02/24/24 0442 Pressure Injury Buttocks Left Stage 2 -  Partial thickness loss of dermis presenting as a shallow open injury with a red, pink wound bed without slough. (Active)    Physical Exam: Vital Signs Blood pressure 139/71, pulse 83, temperature 97.9 F (36.6 C), temperature source Oral, resp. rate 19, height 6' 2 (1.88 m), weight 75.1 kg, SpO2 95%.  Physical Exam Vitals and nursing note reviewed.  Constitutional: No apparent distress. Appropriate appearance for age.  Laying in bed working with speech therapy HENT: No JVD. Neck Supple. Trachea midline. Atraumatic, normocephalic. Eyes: PERRLA. EOMI. Visual fields grossly intact.  Cardiovascular: reg rhythm, bradycardic, no murmurs/rub/gallops.  Respiratory: CTAB. No rales, rhonchi, or wheezing. On RA.  Abdomen: + bowel sounds, normoactive. No distention or tenderness.   Psych: Pleasant cooperative Ext: R BKA, with shrinker in place Neuro: A&O x 4, follows commands, fluent, cranial nerves II through XII grossly  intact,, follows commands   PRIOR EXAMS: Skin: Papery skin with diffuse ecchymotic and flaking BUE. RUE with evidence of third spacing and dependent edema around elbow.  LLE with multiple scabs covered with foam dressing.  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  Pressure area with scab on tibial prominence.     MSK:      + R BKA       + L second toe amputation       + Right shoulder range of motion 0 degrees in flexion, abduction; right shoulder visibly protruding forward from the socket.  No crepitus with passive range of motion.       Neurologic exam:  Cognition: AAO to person, place, time and event.  Language: Fluent, No substitutions or neoglisms. No dysarthria. Names 3/3 objects correctly.  Memory: Recalls 3/3 objects at 5 minutes. No apparent deficits  Insight: Good  insight into current condition.  Mood: Pleasant affect, appropriate mood.  Sensation: To light reduced in left foot and bilateral fingertips in stocking glove pattern.  + Difficulty with proprioception in right hand digits 3 through 5   Reflexes: 2+ in BL UE and LEs. Negative Hoffman's  and babinski signs bilaterally.  CN: 2-12 grossly intact.  Coordination: No apparent tremors. No ataxia  Spasticity: MAS 0 in all extremities.       Strength:                RUE: 0/5 SA, 2/5 EF, 3/5 EE, 4/5 WE, 4/5 FF, 4/5 FA                LUE:  4/5 SA, 4/5 EF, 4/5 EE, 4/5 WE, 4/5 FF, 4/5 FA                RLE: 4/5 HF, 5/5 KE                 LLE:  4/5 HF, 5/5 KE, 3/5  DF, 3/5  EHL, 2/5  PF  Assessment/Plan: 1. Functional deficits which require 3+ hours per day of interdisciplinary therapy in a comprehensive inpatient rehab setting. Physiatrist is providing close team supervision and 24 hour management of active medical problems listed below. Physiatrist and rehab team continue to assess barriers to discharge/monitor patient progress toward functional and medical goals  Care Tool:  Bathing    Body parts  bathed by patient: Right arm, Left arm, Chest, Abdomen, Front perineal area, Right upper leg, Left upper leg, Face   Body parts bathed by helper: Buttocks, Right lower leg, Left lower leg     Bathing assist Assist Level: Moderate Assistance - Patient 50 - 74%     Upper Body Dressing/Undressing Upper body dressing   What is the patient wearing?: Pull over shirt    Upper body assist Assist Level: Set up assist    Lower Body Dressing/Undressing Lower body dressing      What is the patient wearing?: Pants     Lower body assist Assist for lower body dressing: Maximal Assistance - Patient 25 - 49%     Toileting Toileting    Toileting assist Assist for toileting: Moderate Assistance - Patient 50 - 74%     Transfers Chair/bed transfer  Transfers assist     Chair/bed transfer assist level: Moderate Assistance - Patient 50 - 74% (ModA  for SBT)     Locomotion Ambulation   Ambulation assist   Ambulation activity did not occur: Safety/medical concerns          Walk 10 feet activity   Assist  Walk 10 feet activity did not occur: Safety/medical concerns        Walk 50 feet activity   Assist Walk 50 feet with 2 turns activity did not occur: Safety/medical concerns         Walk 150 feet activity   Assist Walk 150 feet activity did not occur: Safety/medical concerns         Walk 10 feet on uneven surface  activity   Assist Walk 10 feet on uneven surfaces activity did not occur: Safety/medical concerns         Wheelchair     Assist Is the patient using a wheelchair?: Yes Type of Wheelchair: Manual    Wheelchair assist level: Supervision/Verbal cueing Max wheelchair distance: 75    Wheelchair 50 feet with 2 turns activity    Assist        Assist Level: Supervision/Verbal cueing   Wheelchair 150 feet activity     Assist      Assist Level: Moderate Assistance - Patient 50 - 74%   Blood pressure 139/71, pulse 83,  temperature 97.9 F (36.6 C), temperature source Oral, resp.  rate 19, height 6' 2 (1.88 m), weight 75.1 kg, SpO2 95%.  Medical Problem List and Plan: 1. Functional deficits secondary to debility secondary to MRSA bacteremia d/t pneumonia             -patient may shower             -ELOS/Goals: 14-16 days, SPV PT/OT              -Continue CIR - Patient states he has right BKA prosthesis awaiting pickup; would contact [employer] this week to see if it can be brought in. -Patient being seen by SLP today, team conference tomorrow   2.  Antithrombotics: -DVT/anticoagulation:  Pharmaceutical: Eliquis  5mg  BID             -antiplatelet therapy: N/A 3. Pain Management: Oxycodone  prn for severe pain. Tylenol  prn for  mild pain.  4. Mood/Behavior/Sleep: LCSW to follow for evaluation and support.              --trazodone  prn for insomnia. Melatonin 5mg  nightly             -antipsychotic agents: N/A 5. Neuropsych/cognition: This patient is capable of making decisions on his own behalf. 6. Skin/Wound Care:  Routine pressure relief measures.    7. Fluids/Electrolytes/Nutrition:  Monitor I/O. Monitor labs. Check CK qMon.   -GERD: protonix  40mg  BID  -02/28/24 CMP stable 8. MRSA bacteremia: On Daptomycin X 4 weeks with EOT 03/22/24 --Ck-119 on 09/29. Recheck on Monday.   --Recurrent leucocytosis 13.7-->9.4-->11.8 -02/28/24 WBC 11.0 today, monitor.  -10/6 WBC 11.2, continue to monitor for signs of infection 9. Indurated area left buttock/cellulitis?: Monitor for worsening. Augmentin  added on 09/30 w/ antibiotic duration to continue pending improvement.  --Repeat imaging in case of worsening to rule out abscess formation.   10. A fib: Monitor HR TID--on Eliquis . HR controlled on cardizem  180mg  daily.   11. HFmrEF: Monitor daily weights and for signs of overload.             --On Lipitor 20mg  daily, Eliquis --off Imdur  due to soft BP.    -02/28/24 added daily weights order  -02/29/24 wt down 10kg but highly  doubt accuracy; monitor for trend  -10/7 weight trending up again but weight still down from 10/3 when he was recorded to be 83.6 kg?  Do not see signs of fluid overload at this time continue to monitor trend Filed Weights   02/29/24 0422 03/01/24 0529 03/02/24 0607  Weight: 73.5 kg 74.4 kg 75.1 kg    11. COPD: On chronic prednisone  20 mg (unable to tolerate taper) -continue Brovana  BID with duo nebs prn.  12. Hypokalemia: Being supplemented intermittently. Low normal currently. 10/4 3.5, monitor  -10/6 will order today 13. Hypomagnesemia: Improved with supplementation. Was 1.6-->2.0             -02/28/24 Mg 2.0 today, monitor.  14. Constipation: Last BM 09/30--on senna-->increase to 2 tabs daily and continue miralax  BID.             --MOM today. Increase as needed.  -02/29/24 LBM yesterday small, pt doesn't recall it, remain on current regimen for now and monitor output.  -10/6 Sorbitol 60ml today 10/7 LBM large yesterday improved   15.  Neuropathy: Followed by [facility].  --On Lyrica  75 mg TID --off duloxetine .  -- of note, ordered as 150mg  daily +75mg  nightly   16. Anemia: Continue iron  supplement.  17. H/o BPH: Foley remains in place-->work on bowel regimen today and  remove foley to start voiding trial tomorrow. Continue Flomax  0.4mg  nightly  -02/28/24 foley out this morning, nursing to monitor PVRs  -02/29/24 good UOP and low PVRs, cont monitoring  - 10/6 remains continent PVR 59 and 0, continue to follow   18.  Right shoulder rotator cuff tear.  [facility] - Has outpatient CT shoulder ordered; would consider getting while he is inpatient to help plan outpatient surgery             - No formal weightbearing precautions  19. Azotemia  -Increase BUN  -encourage oral fluids, appears to be off Lasix  currently  20.  Possible orthostatic hypotension  - 10/7 will monitor for symptoms with therapy, baseline BP appears stable.  Abdominal  binder if needed.  Encourage fluids      03/02/2024    1:15 PM 03/02/2024    6:07 AM 03/02/2024    5:59 AM  Vitals with BMI  Weight  165 lbs 9 oz   BMI  21.25   Systolic 122  139  Diastolic 65  71  Pulse 88  83        LOS: 4 days A FACE TO FACE EVALUATION WAS PERFORMED  Murray Collier 03/02/2024, 1:20 PM

## 2024-03-02 NOTE — Plan of Care (Signed)
  Problem: Consults Goal: RH GENERAL PATIENT EDUCATION Description: See Patient Education module for education specifics. 03/02/2024 0038 by Taft Sari POUR, RN Outcome: Progressing 03/02/2024 0038 by Taft Sari POUR, RN Outcome: Progressing   Problem: RH BOWEL ELIMINATION Goal: RH STG MANAGE BOWEL WITH ASSISTANCE Description: STG Manage Bowel with mod I  Assistance. 03/02/2024 0038 by Taft Sari POUR, RN Outcome: Progressing 03/02/2024 0038 by Taft Sari POUR, RN Outcome: Progressing Goal: RH STG MANAGE BOWEL W/MEDICATION W/ASSISTANCE Description: STG Manage Bowel with Medication with mod I  Assistance. 03/02/2024 0038 by Taft Sari POUR, RN Outcome: Progressing 03/02/2024 0038 by Taft Sari POUR, RN Outcome: Progressing   Problem: RH BLADDER ELIMINATION Goal: RH STG MANAGE BLADDER WITH ASSISTANCE Description: STG Manage Bladder With toileting Assistance 03/02/2024 0038 by Taft Sari POUR, RN Outcome: Progressing 03/02/2024 0038 by Taft Sari POUR, RN Outcome: Progressing Goal: RH STG MANAGE BLADDER WITH MEDICATION WITH ASSISTANCE Description: STG Manage Bladder With Medication With mod I Assistance. 03/02/2024 0038 by Taft Sari POUR, RN Outcome: Progressing 03/02/2024 0038 by Taft Sari POUR, RN Outcome: Progressing   Problem: RH SKIN INTEGRITY Goal: RH STG SKIN FREE OF INFECTION/BREAKDOWN Description: Patient and wife will be able to manage skin with min assist 03/02/2024 0038 by Taft Sari POUR, RN Outcome: Progressing 03/02/2024 0038 by Taft Sari POUR, RN Outcome: Progressing Goal: RH STG MAINTAIN SKIN INTEGRITY WITH ASSISTANCE Description: STG Maintain Skin Integrity With min Assistance. 03/02/2024 0038 by Taft Sari POUR, RN Outcome: Progressing 03/02/2024 0038 by Taft Sari POUR, RN Outcome: Progressing Goal: RH STG ABLE TO PERFORM INCISION/WOUND CARE W/ASSISTANCE Description: STG Able To Perform Incision/Wound Care With Assistance. 03/02/2024 0038 by  Taft Sari POUR, RN Outcome: Progressing 03/02/2024 0038 by Taft Sari POUR, RN Outcome: Progressing   Problem: RH SAFETY Goal: RH STG ADHERE TO SAFETY PRECAUTIONS W/ASSISTANCE/DEVICE Description: STG Adhere to Safety Precautions With cues Assistance/Device. 03/02/2024 0038 by Taft Sari POUR, RN Outcome: Progressing 03/02/2024 0038 by Taft Sari POUR, RN Outcome: Progressing   Problem: RH PAIN MANAGEMENT Goal: RH STG PAIN MANAGED AT OR BELOW PT'S PAIN GOAL Description: Pain < 4 with prns 03/02/2024 0038 by Taft Sari POUR, RN Outcome: Progressing 03/02/2024 0038 by Taft Sari POUR, RN Outcome: Progressing   Problem: RH KNOWLEDGE DEFICIT GENERAL Goal: RH STG INCREASE KNOWLEDGE OF SELF CARE AFTER HOSPITALIZATION Description: Patient and wife will be able to manage care at discharge using educational resources independently 03/02/2024 0038 by Taft Sari POUR, RN Outcome: Progressing 03/02/2024 0038 by Taft Sari POUR, RN Outcome: Progressing

## 2024-03-02 NOTE — Progress Notes (Signed)
 Occupational Therapy Session Note  Patient Details  Name: Levi [name] MRN: [id number] Date of Birth: 10/29/1945  Today's Date: 03/02/2024 OT Individual Time: 1047-1200 OT Individual Time Calculation (min): 73 min    Short Term Goals: Week 1:  OT Short Term Goal 1 (Week 1): The pt will safely complete LB bathing/dressing with ModI incorporating AE as needed. OT Short Term Goal 2 (Week 1): The pt will safely complete all functional t/f 's using a sliding board at [employer] OT Short Term Goal 3 (Week 1): The pt will safely participate in > 30 minutes of Occupational Therapy activity with minimal rest breaks. OT Short Term Goal 4 (Week 1): The pt will improve core strength by 1 grade to improve safety and Ind for functional task completion.  Skilled Therapeutic Interventions/Progress Updates: Patient received sitting up in w/c. Prosthetist present and bringing patient LLE prosthetic. Patient and spouse educated on donning prosthetic and assisted to stand by therapist Max of one to the RW to allow prosthetist to observe patient in standing and progressing to take a step. Patient able to take one step with each LE and Mod assist and cues for up right posture. Patient returned to sitting in w/c. Continued treatment with education on doffing prosthetic and the sleeve as well as daily care and washing. Patient unable to don due to BUE weakness and limited range reaching towards end of residual limb, but wife was able to return demo donning sleeve and the patient was able to initiate it's removal. Continued treatment in therapy gym working on BUE AROM and strength. Worked on R [facility] mobilization and IR/ER range to allow for abduction of the RUE for dressing and skin care. Followed with use of 3 lb hand weight in the LUE for strength and endurance. Tolerated sets of 5-8 for shoulder flexion, lateral raise and forward reach. Tolerated sets of 10 for IR/ER rotation and biceps flexion. Assisted patient back to bed  following treatment with the use of a slide board- Mod assist. Patient and wife with good response to prosthetic education. Continue with skilled OT treatment working towards LTG's.      Therapy Documentation Precautions:  Precautions Precautions: Fall Recall of Precautions/Restrictions: Intact Precaution/Restrictions Comments: R BKA Restrictions Weight Bearing Restrictions Per Provider Order: No Other Position/Activity Restrictions: chronic R BKA, does not have prosthetic yet but was supposed to pick up from [employer] this week, MRSA General:     Pain: Pain Assessment Pain Scale: Faces Faces Pain Scale: No hurt   Therapy/Group: Individual Therapy  Levi [name] 03/02/2024, 1:10 PM

## 2024-03-02 NOTE — Progress Notes (Addendum)
 Physical Therapy Session Note  Patient Details  Name: Levi [name] MRN: [id number] Date of Birth: [birth year]  Today's Date: 03/02/2024 PT Individual Time: 0932-1016, [id number] PT Individual Time Calculation (min): 44 min, 26 min  Short Term Goals: Week 1:  PT Short Term Goal 1 (Week 1): pt will perform sit to stand with LRAD and mod A PT Short Term Goal 2 (Week 1): PT will contact [employer] for R LE prosthetic delivery and fit to pt PT Short Term Goal 3 (Week 1): pt will perform bed to chair transfer with min A consistently  Skilled Therapeutic Interventions/Progress Updates:      Treatment Session 1  Pt supine in bed upon arrival. Pt agreeable to therapy. Pt denies any pain.   Bed mobility: supine to sit with use of bed rail and supervision and increased time.   Pt denies any dizziness/lightheadedness during session.   Donned L shoe with total A, pt required supervision for seated balance for correction of posterior bais-pt able to correct with UE support.   Slide board transfer to R with light min A with therapist placing/removing board, VC provided for head hip ratio and LE positioning.   WC pushups 2x5x 5 hold, Vcs provided for technique and increased buttocks clearance.    Pt wife arrived during session.   Pt endorses he transferred to [facility] over toilet last night by himself from [facility] level. Pt endorses he self propelled WC into bathroom and transferred WC to [facility] via lateral scoot transfer. Remainder of session, pt walked through with PT how he did it.    Pt successfully self propelled WC backwards into bathroom with ++ time. Pt required max verbal cues for sequencing to turn WC around to face BSC. Pt required max verbal cues for locking WC brakes. Pt required min A for removing arm rest. Pt attempted lateral scoot transfer however self discontinued for safety.   Followed up with tech who found pt last night and she endorses he did get into the bathroom but got stuck trying to turn  the [facility] around to face the [facility].   Pt wife present and endorses hospital delirium during end of previous CIR stay however has since then noticed progressive cognitive decline with memory and problem solving deficits. Pt wife endorses she has scheduled a consult with neurologist. Education provided for pt to press call bell for assistance if he needs anything versus trying to do it himself. Pt verbalized understanding and agreeable. Notified SLP.   Pt seated in [facility] with all needs wtihin reach and seatbelt alarm on.   Treatment Session 2   Pt supine in bed upon arrival. Pt agreeable to therapy. Pt denies any pain.   Pt R LE prosthetic delivered to room. PT answered pt and pt wife questions to the best of her ability regarding proper fit. PT followed up with prosthetist on recommendations for socks and number of clicks for proper fit for pt as pt endorsing concerns regarding this and R LE calf tightness upon standing.   Pt required total A for donning/doffing. Education provided for how to donn/doff.   Pt performed sit to stand x2 with +2 min A from elevated bed. Pt required verbal cues for UE positioning and R LE quad activation. Pt required assistance for R LE positioning, education provided for use of R hip/knee flexion for control of R LE positioning.   Pt denies any N&T throughout session. Plan to trail gait in // bars next session.   Pt supine  in bed at end of session with all needs within reach and bed alarm on.    Therapy Documentation Precautions:  Precautions Precautions: Fall Recall of Precautions/Restrictions: Intact Precaution/Restrictions Comments: R BKA Restrictions Weight Bearing Restrictions Per Provider Order: No Other Position/Activity Restrictions: chronic R BKA, does not have prosthetic yet but was supposed to pick up from [employer] this week, MRSA  Therapy/Group: Individual Therapy  [facility] [place], [place], DPT  03/02/2024, 7:30 AM

## 2024-03-03 DIAGNOSIS — R413 Other amnesia: Secondary | ICD-10-CM

## 2024-03-03 LAB — MINIMUM INHIBITORY CONC. (1 DRUG)

## 2024-03-03 LAB — MIC RESULT

## 2024-03-03 NOTE — Plan of Care (Signed)
  Problem: Consults Goal: RH GENERAL PATIENT EDUCATION Description: See Patient Education module for education specifics. Outcome: Progressing   Problem: RH BOWEL ELIMINATION Goal: RH STG MANAGE BOWEL WITH ASSISTANCE Description: STG Manage Bowel with mod I  Assistance. Outcome: Progressing Goal: RH STG MANAGE BOWEL W/MEDICATION W/ASSISTANCE Description: STG Manage Bowel with Medication with mod I  Assistance. Outcome: Progressing   Problem: RH BLADDER ELIMINATION Goal: RH STG MANAGE BLADDER WITH ASSISTANCE Description: STG Manage Bladder With toileting Assistance Outcome: Progressing Goal: RH STG MANAGE BLADDER WITH MEDICATION WITH ASSISTANCE Description: STG Manage Bladder With Medication With mod I Assistance. Outcome: Progressing   Problem: RH SKIN INTEGRITY Goal: RH STG SKIN FREE OF INFECTION/BREAKDOWN Description: Patient and wife will be able to manage skin with min assist Outcome: Progressing Goal: RH STG MAINTAIN SKIN INTEGRITY WITH ASSISTANCE Description: STG Maintain Skin Integrity With min Assistance. Outcome: Progressing Goal: RH STG ABLE TO PERFORM INCISION/WOUND CARE W/ASSISTANCE Description: STG Able To Perform Incision/Wound Care With Assistance. Outcome: Progressing   Problem: RH SAFETY Goal: RH STG ADHERE TO SAFETY PRECAUTIONS W/ASSISTANCE/DEVICE Description: STG Adhere to Safety Precautions With cues Assistance/Device. Outcome: Progressing   Problem: RH PAIN MANAGEMENT Goal: RH STG PAIN MANAGED AT OR BELOW PT'S PAIN GOAL Description: Pain < 4 with prns Outcome: Progressing   Problem: RH KNOWLEDGE DEFICIT GENERAL Goal: RH STG INCREASE KNOWLEDGE OF SELF CARE AFTER HOSPITALIZATION Description: Patient and wife will be able to manage care at discharge using educational resources independently Outcome: Progressing

## 2024-03-03 NOTE — Progress Notes (Addendum)
 Physical Therapy Session Note  Patient Details  Name: Levi [name] MRN: [id number] Date of Birth: 1945/06/21  Today's Date: 03/03/2024 PT Individual Time: 9199-9155, 1106-1201 PT Individual Time Calculation (min): 44 min, 55 min  Short Term Goals: Week 1:  PT Short Term Goal 1 (Week 1): pt will perform sit to stand with LRAD and mod A PT Short Term Goal 2 (Week 1): PT will contact [employer] for R LE prosthetic delivery and fit to pt PT Short Term Goal 3 (Week 1): pt will perform bed to chair transfer with min A consistently  Skilled Therapeutic Interventions/Progress Updates:      Treatment Session 1  Pt supine in bed upon arrival. Pt agreeable to therapy. Pt endorses R shoulder pain 7/10 however pt declining medication for it at this time. PT provided rest breaks and repositioning.   Supine to sit with use of bed features and CGA/min A with increased time, verbal cues provided for sequencing.   Pt denies any lightheadedness/dizziness throughout session.   Pt seated EOB with use of B UE support and SBA.  Pt required total A for donning L shoe. Pt demonstrates posterior bias, requiring verbal cues for correction.   Pt endorses he changed his pants this morning. Pt requesting to change shirt. Pt donned/doffed shirt while seated EOB with ++ time and SBA/min A.   Pt doffed shrinker with min A while seated EOB with SBA, verbal cues provided for use of B UE with upright posture versus leaning on R UE while attempting to doff with L UE.   Pt donned prosthesis sleeve with mod A, pt required cues for proper alignment and technique for rolling up leg with emphasis on posterior thigh.   Pt donned prosthesis with total A, verbal cues provided for sequencing/technique.   Pt performed slide board transfer bed to [facility] with therapist placing slide board and min assist with verbal cues provided for starting position and head hip ratio. Pt required assistance for management of arm and leg rests. Verbal  cues provided for R prosthesis position and technique for adjusting prosthetic foot positioning.   Pt ambulated 1x8, 1x5 feet in // bars with use of R LE prosthesis and +2 for WC follow.   Doffed R LE prosthetic at end of session for skin integrity.   Pt seated in WC at end of session with all needs within reach and bed alarm on.   Treatment Session 2   Pt seated in WC upon arrival. Pt agreeable to therapy. Pt denies any pain.   Donned foam dressing to tibial tuberosity for overall skin intergreity and to reduce risk of skin breakdown 2/2 baseline redness in this area.   PT followed up with pt and pt wife regarding prosthetic concerns/fit - and L LE calf tightnes after PT consulted with [place], representative from [employer]. Pt and pt wife verbalized understanding and agreeable.   Pt performed stand pivot transfer x1with min-mod A for power up and min A for pivot with RW, verbal cues provided for UE positioning and R LE positioning. Verbal cues provided for sequencing.   Pt ambulated 2x5 feet with RW and min A with +2 for WC follow. Pt required mod A for stand to sit 2/2 fatigue.   Pt performed squat pivot transfer WC to bed with max A, verbal cues provided for technique/sequencing and head hip ratio.   Sit to supine with ++ time and supervision, pt required assist to scoot up in bed.   Pt supine in  bed at end of sesison with all needs within reach and bed alamr on.       Therapy Documentation Precautions:  Precautions Precautions: Fall Recall of Precautions/Restrictions: Intact Precaution/Restrictions Comments: R BKA Restrictions Weight Bearing Restrictions Per Provider Order: No Other Position/Activity Restrictions: chronic R BKA, does not have prosthetic yet but was supposed to pick up from [employer] this week, MRSA  Therapy/Group: Individual Therapy  [facility] [place], [place], DPT  03/03/2024, 7:21 AM

## 2024-03-03 NOTE — Group Note (Signed)
 Patient Details Name: TAFARI HUMISTON MRN: [id number] DOB: 04/15/1946 Today's Date: 03/03/2024  Time Calculation:   PT Group Time Calculation PT Group Start Time: 1330 PT Group Stop Time: 1430 PT Group Time Calculation (min): 60 min    Individual level documentation: Pt performed seated and standing exercises including the following  with cues for technique and exhale on exertion:   Group Description: LE Group: Pt participated in LE therapeutic exercise group to address functional strengthening, endurance, and ROM to increase overall functional independence with mobility in a social setting.   Pt performed seated and standing exercises including the following with cues for technique and exhale on exertion:   Participated in the following therex all performed to fatigue for a minimum of 1 min.  Ankle pumps/circles (C/CW) Seated marches Hip abd/add Hip ER/IR with green theraband (PTA assisting in setting up band) Towel placed under feet performing heel slides (ice skaters)  Quad/Hamstring co-contraction (bug squashes) Kicking bounce ball  Sit to stand x 5 Standing ball taps x 2 min   Cool down with hamstring stretch 30 sec x 2 bilaterally (if able)  At end of session pt transported back to room. Remained in w/c with call bell within reach and needs met.    Pain:  Denies  Precautions:  None   Eulises Kijowski 03/03/2024, 4:11 PM

## 2024-03-03 NOTE — Progress Notes (Signed)
 Occupational Therapy Session Note  Patient Details  Name: Levi [name] MRN: [id number] Date of Birth: [birth year]  Today's Date: 03/03/2024 OT Individual Time: 8947-8874 OT Individual Time Calculation (min): 33 min    Short Term Goals: Week 1:  OT Short Term Goal 1 (Week 1): The pt will safely complete LB bathing/dressing with ModI incorporating AE as needed. OT Short Term Goal 2 (Week 1): The pt will safely complete all functional t/f 's using a sliding board at [employer] OT Short Term Goal 3 (Week 1): The pt will safely participate in > 30 minutes of Occupational Therapy activity with minimal rest breaks. OT Short Term Goal 4 (Week 1): The pt will improve core strength by 1 grade to improve safety and Ind for functional task completion.  Skilled Therapeutic Interventions/Progress Updates:    Pt received sitting in the w/c with his wife present. He complained of pain in his bottom from sitting. Adjusted his ROHO to release air to ensure proper fitting and he reported a big improvement in comfort and pressure relief. Provided education on pressure relief positioning. Donned his prosthetic with mod A. He stood with mod A to power up from the w/c and then min A once standing with the RW support. He completed about 50 ft of w/c propulsion with (S), limited by R shoulder pain. He completed a stand pivot to the mat with the RW with mod A and mod cueing for RLE positioning and RW management. Similar cueing required for 2 bouts of 10 ft of functional mobility, min-mod A overall. He returned to his room and was left sitting up in the w/c with all needs met. Wife present.   Therapy Documentation Precautions:  Precautions Precautions: Fall Recall of Precautions/Restrictions: Intact Precaution/Restrictions Comments: R BKA Restrictions Weight Bearing Restrictions Per Provider Order: No Other Position/Activity Restrictions: chronic R BKA, does not have prosthetic yet but was supposed to pick up from [employer]  this week, MRSA  Therapy/Group: Individual Therapy  Nena VEAR Moats 03/03/2024, 8:09 AM

## 2024-03-03 NOTE — Progress Notes (Addendum)
 PROGRESS NOTE   Subjective/Complaints: Prosthetic delivered yesterday. Wife concerned about subtle decline in cognition over several months.  Patient had SLP evaluation yesterday and overall did well with this but wife feels like he has more subtle/intermittent changes.  Occasional dizziness not wearing any compression in his lower extremities or abdomen.  ROS: as per HPI. Denies CP, SOB, abd pain, N/V/D, or any other complaints at this time.   + constipation-improved LBM 10/8 + Right shoulder pain chronic + Memory changes over several months Objective:   No results found.  Recent Labs    03/01/24 0500  WBC 11.2*  HGB 9.5*  HCT 30.8*  PLT 251   Recent Labs    03/01/24 0500  NA 141  K 3.4*  CL 104  CO2 26  GLUCOSE 90  BUN 19  CREATININE 0.62  CALCIUM  8.3*        Intake/Output Summary (Last 24 hours) at 03/03/2024 0944 Last data filed at 03/03/2024 0925 Gross per 24 hour  Intake 700 ml  Output 825 ml  Net -125 ml     Wound 02/24/24 0441 Pressure Injury Sacrum Mid Stage 1 -  Intact skin with non-blanchable redness of a localized area usually over a bony prominence. (Active)     Wound 02/24/24 0442 Pressure Injury Buttocks Left Stage 2 -  Partial thickness loss of dermis presenting as a shallow open injury with a red, pink wound bed without slough. (Active)    Physical Exam: Vital Signs Blood pressure (!) 138/96, pulse (!) 58, temperature (!) 97.5 F (36.4 C), temperature source Oral, resp. rate 18, height 6' 2 (1.88 m), weight 75.8 kg, SpO2 94%.  Physical Exam Vitals and nursing note reviewed.  Constitutional: No apparent distress. Appropriate appearance for age.  Sitting in wheelchair appears comfortable HENT: No JVD. Neck Supple. Trachea midline. Atraumatic, normocephalic. Eyes: PERRLA. EOMI. Visual fields grossly intact.  Cardiovascular: reg rhythm, bradycardic, no murmurs/rub/gallops.  Respiratory:  CTAB. No rales, rhonchi, or wheezing. On RA.  Abdomen: + bowel sounds, normoactive. No distention or tenderness.   Psych: Pleasant cooperative Ext: R BKA with incision appears to be healing well Neuro: A&O x 4, follows commands, fluent, cranial nerves II through XII grossly intact,, follows commands   PRIOR EXAMS: Skin: Papery skin with diffuse ecchymotic and flaking BUE. RUE with evidence of third spacing and dependent edema around elbow.  LLE with multiple scabs covered with foam dressing.  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  Pressure area with scab on tibial prominence.     MSK:      + R BKA       + L second toe amputation       + Right shoulder range of motion 0 degrees in flexion, abduction; right shoulder visibly protruding forward from the socket.  No crepitus with passive range of motion.       Neurologic exam:  Cognition: AAO to person, place, time and event.  Language: Fluent, No substitutions or neoglisms. No dysarthria. Names 3/3 objects correctly.  Memory: Recalls 3/3 objects at 5 minutes. No apparent deficits  Insight: Good  insight into current condition.  Mood: Pleasant affect, appropriate mood.  Sensation: To light reduced in left foot and bilateral fingertips in stocking glove pattern.  + Difficulty with proprioception in right hand digits 3 through 5   Reflexes: 2+ in BL UE and LEs. Negative Hoffman's and babinski signs bilaterally.  CN: 2-12 grossly intact.  Coordination: No apparent tremors. No ataxia  Spasticity: MAS 0 in all extremities.       Strength:                RUE: 0/5 SA, 2/5 EF, 3/5 EE, 4/5 WE, 4/5 FF, 4/5 FA                LUE:  4/5 SA, 4/5 EF, 4/5 EE, 4/5 WE, 4/5 FF, 4/5 FA                RLE: 4/5 HF, 5/5 KE                 LLE:  4/5 HF, 5/5 KE, 3/5  DF, 3/5  EHL, 2/5  PF  Assessment/Plan: 1. Functional deficits which require 3+ hours per day of interdisciplinary therapy in a comprehensive inpatient rehab  setting. Physiatrist is providing close team supervision and 24 hour management of active medical problems listed below. Physiatrist and rehab team continue to assess barriers to discharge/monitor patient progress toward functional and medical goals  Care Tool:  Bathing    Body parts bathed by patient: Right arm, Left arm, Chest, Abdomen, Front perineal area, Right upper leg, Left upper leg, Face   Body parts bathed by helper: Buttocks, Right lower leg, Left lower leg     Bathing assist Assist Level: Moderate Assistance - Patient 50 - 74%     Upper Body Dressing/Undressing Upper body dressing   What is the patient wearing?: Pull over shirt    Upper body assist Assist Level: Set up assist    Lower Body Dressing/Undressing Lower body dressing      What is the patient wearing?: Pants     Lower body assist Assist for lower body dressing: Maximal Assistance - Patient 25 - 49%     Toileting Toileting    Toileting assist Assist for toileting: Moderate Assistance - Patient 50 - 74%     Transfers Chair/bed transfer  Transfers assist     Chair/bed transfer assist level: Moderate Assistance - Patient 50 - 74% (ModA  for SBT)     Locomotion Ambulation   Ambulation assist   Ambulation activity did not occur: Safety/medical concerns          Walk 10 feet activity   Assist  Walk 10 feet activity did not occur: Safety/medical concerns        Walk 50 feet activity   Assist Walk 50 feet with 2 turns activity did not occur: Safety/medical concerns         Walk 150 feet activity   Assist Walk 150 feet activity did not occur: Safety/medical concerns         Walk 10 feet on uneven surface  activity   Assist Walk 10 feet on uneven surfaces activity did not occur: Safety/medical concerns         Wheelchair     Assist Is the patient using a wheelchair?: Yes Type of Wheelchair: Manual    Wheelchair assist level: Supervision/Verbal  cueing Max wheelchair distance: 75    Wheelchair 50 feet with 2 turns activity    Assist        Assist Level: Supervision/Verbal cueing   Wheelchair 150  feet activity     Assist      Assist Level: Moderate Assistance - Patient 50 - 74%   Blood pressure (!) 138/96, pulse (!) 58, temperature (!) 97.5 F (36.4 C), temperature source Oral, resp. rate 18, height 6' 2 (1.88 m), weight 75.8 kg, SpO2 94%.  Medical Problem List and Plan: 1. Functional deficits secondary to debility secondary to MRSA bacteremia d/t pneumonia             -patient may shower             -ELOS/Goals: 14-16 days, SPV PT/OT              -Continue CIR - Patient states he has right BKA prosthesis awaiting pickup; would contact [employer] this week to see if it can be brought in. -PT seen by SLP- did well overall -Team conference today please see physician documentation under team conference tab, met with team  to discuss problems,progress, and goals. Formulized individual treatment plan based on medical history, underlying problem and comorbidities.     2.  Antithrombotics: -DVT/anticoagulation:  Pharmaceutical: Eliquis  5mg  BID             -antiplatelet therapy: N/A 3. Pain Management: Oxycodone  prn for severe pain. Tylenol  prn for  mild pain.  4. Mood/Behavior/Sleep: LCSW to follow for evaluation and support.              --trazodone  prn for insomnia. Melatonin 5mg  nightly             -antipsychotic agents: N/A 5. Neuropsych/cognition: This patient is capable of making decisions on his own behalf. 6. Skin/Wound Care:  Routine pressure relief measures.    7. Fluids/Electrolytes/Nutrition:  Monitor I/O. Monitor labs. Check CK qMon.   -GERD: protonix  40mg  BID  -02/28/24 CMP stable 8. MRSA bacteremia: On Daptomycin X 4 weeks with EOT 03/22/24 --Ck-119 on 09/29. Recheck on Monday.   --Recurrent leucocytosis 13.7-->9.4-->11.8 -02/28/24 WBC 11.0 today, monitor.  -10/6 WBC 11.2, continue to monitor for  signs of infection  Recheck tomorrow 9. Indurated area left buttock/cellulitis?: Monitor for worsening. Augmentin  added on 09/30 w/ antibiotic duration to continue pending improvement.  --Repeat imaging in case of worsening to rule out abscess formation.   10. A fib: Monitor HR TID--on Eliquis . HR controlled on cardizem  180mg  daily.   11. HFmrEF: Monitor daily weights and for signs of overload.             --On Lipitor 20mg  daily, Eliquis --off Imdur  due to soft BP.    -02/28/24 added daily weights order  -02/29/24 wt down 10kg but highly doubt accuracy; monitor for trend  -10/7 weight trending up again but weight still down from 10/3 when he was recorded to be 83.6 kg?  Do not see signs of fluid overload at this time continue to monitor trend  -10/8 weight trending up however down from weight on 10/3.  Therapy may check weight in wheelchair to compare.  No signs of fluid overload noted Filed Weights   03/01/24 0529 03/02/24 0607 03/03/24 0654  Weight: 74.4 kg 75.1 kg 75.8 kg    11. COPD: On chronic prednisone  20 mg (unable to tolerate taper) -continue Brovana  BID with duo nebs prn.  12. Hypokalemia: Being supplemented intermittently. Low normal currently. 10/4 3.5, monitor  -10/6 will order today  Recheck tomorrow 13. Hypomagnesemia: Improved with supplementation. Was 1.6-->2.0             -02/28/24 Mg 2.0 today, monitor.  Recheck tomorrow 14. Constipation: Last BM 09/30--on senna-->increase to 2 tabs daily and continue miralax  BID.             --MOM today. Increase as needed.  -02/29/24 LBM yesterday small, pt doesn't recall it, remain on current regimen for now and monitor output.  -10/6 Sorbitol 60ml today 10/7 LBM large yesterday improved 10/8 LBM today continue to monitor   15.  Neuropathy: Followed by [facility].  --On Lyrica  75 mg TID --off duloxetine .  -- of note, ordered as 150mg  daily +75mg  nightly  - Patient was followed by neurology see #[id number]   16. Anemia:  Continue iron  supplement.  17. H/o BPH: Foley remains in place-->work on bowel regimen today and remove foley to start voiding trial tomorrow. Continue Flomax  0.4mg  nightly  -02/28/24 foley out this morning, nursing to monitor PVRs  -02/29/24 good UOP and low PVRs, cont monitoring  - 10/6 remains continent PVR 59 and 0, continue to follow  -10/8 continent bladder scans do not appear to be elevated-DC routine scans   18.  Right shoulder rotator cuff tear.  [facility] - Has outpatient CT shoulder ordered; would consider getting while he is inpatient to help plan outpatient surgery             - No formal weightbearing precautions  19. Azotemia  -Increase BUN  -encourage oral fluids, appears to be off Lasix  currently  - Recheck tomorrow  20.  Possible orthostatic hypotension  - 10/7 will monitor for symptoms with therapy, baseline BP appears stable.  Abdominal binder if needed.  Encourage fluids  -10/8 continue to monitor BP with therapy, encourage fluids, LE compression/abd binder if needed      03/03/2024    8:56 AM 03/03/2024    6:54 AM 03/03/2024    5:52 AM  Vitals with BMI  Weight  167 lbs   BMI  21.43   Systolic   138  Diastolic   96  Pulse 58  57   21.  Memory changes.  Appears more subacute versus chronic.  He has been following with neurology.  Reviewed recent notes patients wife indicated he had stable MRI however neurology note 7/30 indicates MRI was declined.  Neurology was considering referral to neuromuscular specialist for his neuropathy.  - Continue follow-up with outpatient neurology, consideration of neuropsych testing     LOS: 5 days A FACE TO FACE EVALUATION WAS PERFORMED  Murray Collier 03/03/2024, 9:44 AM

## 2024-03-04 LAB — BASIC METABOLIC PANEL WITH GFR
Anion gap: 13 (ref 5–15)
BUN: 25 mg/dL — ABNORMAL HIGH (ref 8–23)
CO2: 24 mmol/L (ref 22–32)
Calcium: 8.9 mg/dL (ref 8.9–10.3)
Chloride: 103 mmol/L (ref 98–111)
Creatinine, Ser: 0.88 mg/dL (ref 0.61–1.24)
GFR, Estimated: >60 mL/min (ref >60)
Glucose, Bld: 130 mg/dL — ABNORMAL HIGH (ref 70–99)
Potassium: 3.8 mmol/L (ref 3.5–5.1)
Sodium: 140 mmol/L (ref 135–145)

## 2024-03-04 LAB — CBC
HCT: 29.5 % — ABNORMAL LOW (ref 39.0–52.0)
Hemoglobin: 9.1 g/dL — ABNORMAL LOW (ref 13.0–17.0)
MCH: 27.7 pg (ref 26.0–34.0)
MCHC: 30.8 g/dL (ref 30.0–36.0)
MCV: 89.9 fL (ref 80.0–100.0)
Platelets: 242 K/uL (ref 150–400)
RBC: 3.28 MIL/uL — ABNORMAL LOW (ref 4.22–5.81)
RDW: 16.9 % — ABNORMAL HIGH (ref 11.5–15.5)
WBC: 8.4 K/uL (ref 4.0–10.5)
nRBC: 0.2 % (ref 0.0–0.2)

## 2024-03-04 LAB — CULTURE, BLOOD (ROUTINE X 2): Special Requests: ADEQUATE

## 2024-03-04 LAB — MAGNESIUM: Magnesium: 2.1 mg/dL (ref 1.7–2.4)

## 2024-03-04 NOTE — Progress Notes (Signed)
 PROGRESS NOTE   Subjective/Complaints: No new complaints or concerns this morning.  Had a brief session with PT this morning.  Working with his new prosthetic device, reports he is excited to use this.  Pain overall under control  ROS: as per HPI. Denies CP, SOB, abd pain, N/V/D, or any other complaints at this time.   + constipation-improved LBM 10/9 + Right shoulder pain chronic + Memory changes over several months-no recent changes Objective:   No results found.  Recent Labs    03/04/24 0447  WBC 8.4  HGB 9.1*  HCT 29.5*  PLT 242   No results for input(s): NA, K, CL, CO2, GLUCOSE, BUN, CREATININE, CALCIUM  in the last 72 hours.       Intake/Output Summary (Last 24 hours) at 03/04/2024 1231 Last data filed at 03/04/2024 0830 Gross per 24 hour  Intake 930 ml  Output 250 ml  Net 680 ml     Wound 02/24/24 0441 Pressure Injury Sacrum Mid Stage 1 -  Intact skin with non-blanchable redness of a localized area usually over a bony prominence. (Active)     Wound 02/24/24 0442 Pressure Injury Buttocks Left Stage 2 -  Partial thickness loss of dermis presenting as a shallow open injury with a red, pink wound bed without slough. (Active)    Physical Exam: Vital Signs Blood pressure 110/67, pulse 78, temperature 98.2 F (36.8 C), resp. rate 14, height 6' 2 (1.88 m), weight 70.8 kg, SpO2 95%.  Physical Exam Vitals and nursing note reviewed.  Constitutional: No apparent distress. Appropriate appearance for age.  Sitting in wheelchair appears comfortable HENT: No JVD. Neck Supple. Trachea midline. Atraumatic, normocephalic. Eyes: PERRLA. EOMI. Visual fields grossly intact.  Cardiovascular: reg rhythm, bradycardic, no murmurs/rub/gallops.  Respiratory: CTAB. No rales, rhonchi, or wheezing. On RA.  Abdomen: + bowel sounds, normoactive. No distention or tenderness.   Psych: Pleasant cooperative Ext: R BKA  wearing prosthetic device Neuro: A&O x 4, follows commands, fluent, cranial nerves II through XII grossly intact,, follows commands   PRIOR EXAMS: Skin: Papery skin with diffuse ecchymotic and flaking BUE. RUE with evidence of third spacing and dependent edema around elbow.  LLE with multiple scabs covered with foam dressing.  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  Pressure area with scab on tibial prominence.     MSK:      + R BKA       + L second toe amputation       + Right shoulder range of motion 0 degrees in flexion, abduction; right shoulder visibly protruding forward from the socket.  No crepitus with passive range of motion.       Neurologic exam:  Cognition: AAO to person, place, time and event.  Language: Fluent, No substitutions or neoglisms. No dysarthria. Names 3/3 objects correctly.  Memory: Recalls 3/3 objects at 5 minutes. No apparent deficits  Insight: Good  insight into current condition.  Mood: Pleasant affect, appropriate mood.  Sensation: To light reduced in left foot and bilateral fingertips in stocking glove pattern.  + Difficulty with proprioception in right hand digits 3 through 5   Reflexes: 2+ in BL UE and LEs.  Negative Hoffman's and babinski signs bilaterally.  CN: 2-12 grossly intact.  Coordination: No apparent tremors. No ataxia  Spasticity: MAS 0 in all extremities.       Strength:                RUE: 0/5 SA, 2/5 EF, 3/5 EE, 4/5 WE, 4/5 FF, 4/5 FA                LUE:  4/5 SA, 4/5 EF, 4/5 EE, 4/5 WE, 4/5 FF, 4/5 FA                RLE: 4/5 HF, 5/5 KE                 LLE:  4/5 HF, 5/5 KE, 3/5  DF, 3/5  EHL, 2/5  PF  Assessment/Plan: 1. Functional deficits which require 3+ hours per day of interdisciplinary therapy in a comprehensive inpatient rehab setting. Physiatrist is providing close team supervision and 24 hour management of active medical problems listed below. Physiatrist and rehab team continue to assess barriers to  discharge/monitor patient progress toward functional and medical goals  Care Tool:  Bathing    Body parts bathed by patient: Right arm, Left arm, Chest, Abdomen, Front perineal area, Right upper leg, Left upper leg, Face   Body parts bathed by helper: Buttocks, Right lower leg, Left lower leg     Bathing assist Assist Level: Moderate Assistance - Patient 50 - 74%     Upper Body Dressing/Undressing Upper body dressing   What is the patient wearing?: Pull over shirt    Upper body assist Assist Level: Set up assist    Lower Body Dressing/Undressing Lower body dressing      What is the patient wearing?: Pants     Lower body assist Assist for lower body dressing: Maximal Assistance - Patient 25 - 49%     Toileting Toileting    Toileting assist Assist for toileting: Moderate Assistance - Patient 50 - 74%     Transfers Chair/bed transfer  Transfers assist     Chair/bed transfer assist level: Moderate Assistance - Patient 50 - 74% (ModA  for SBT)     Locomotion Ambulation   Ambulation assist   Ambulation activity did not occur: Safety/medical concerns  Assist level: 2 helpers Assistive device: Walker-rolling Max distance: 42ft   Walk 10 feet activity   Assist  Walk 10 feet activity did not occur: Safety/medical concerns        Walk 50 feet activity   Assist Walk 50 feet with 2 turns activity did not occur: Safety/medical concerns         Walk 150 feet activity   Assist Walk 150 feet activity did not occur: Safety/medical concerns         Walk 10 feet on uneven surface  activity   Assist Walk 10 feet on uneven surfaces activity did not occur: Safety/medical concerns         Wheelchair     Assist Is the patient using a wheelchair?: Yes Type of Wheelchair: Manual    Wheelchair assist level: Supervision/Verbal cueing Max wheelchair distance: 75    Wheelchair 50 feet with 2 turns activity    Assist        Assist  Level: Supervision/Verbal cueing   Wheelchair 150 feet activity     Assist      Assist Level: Moderate Assistance - Patient 50 - 74%   Blood pressure 110/67, pulse 78, temperature 98.2 F (  36.8 C), resp. rate 14, height 6' 2 (1.88 m), weight 70.8 kg, SpO2 95%.  Medical Problem List and Plan: 1. Functional deficits secondary to debility secondary to MRSA bacteremia d/t pneumonia             -patient may shower             -ELOS/Goals: 14-16 days, [facility] PT/OT              -Continue CIR - Patient states he has right BKA prosthesis awaiting pickup; would contact [employer] this week to see if it can be brought in. -PT seen by SLP- did well overall - Expected discharge 10/17    2.  Antithrombotics: -DVT/anticoagulation:  Pharmaceutical: Eliquis  5mg  BID             -antiplatelet therapy: N/A 3. Pain Management: Oxycodone  prn for severe pain. Tylenol  prn for  mild pain.  4. Mood/Behavior/Sleep: LCSW to follow for evaluation and support.              --trazodone  prn for insomnia. Melatonin 5mg  nightly             -antipsychotic agents: N/A 5. Neuropsych/cognition: This patient is capable of making decisions on his own behalf. 6. Skin/Wound Care:  Routine pressure relief measures.    7. Fluids/Electrolytes/Nutrition:  Monitor I/O. Monitor labs. Check CK qMon.   -GERD: protonix  40mg  BID  -02/28/24 CMP stable 8. MRSA bacteremia: On Daptomycin X 4 weeks with EOT 03/22/24 --Ck-119 on 09/29. Recheck on Monday.   --Recurrent leucocytosis 13.7-->9.4-->11.8 -02/28/24 WBC 11.0 today, monitor.  - 10/9 WBC down to 8.4 9. Indurated area left buttock/cellulitis?: Monitor for worsening. Augmentin  added on 09/30 w/ antibiotic duration to continue pending improvement.  --Repeat imaging in case of worsening to rule out abscess formation.   10. A fib: Monitor HR TID--on Eliquis . HR controlled on cardizem  180mg  daily.   11. HFmrEF: Monitor daily weights and for signs of overload.             --On  Lipitor 20mg  daily, Eliquis --off Imdur  due to soft BP.    -02/28/24 added daily weights order  -02/29/24 wt down 10kg but highly doubt accuracy; monitor for trend  -10/7 weight trending up again but weight still down from 10/3 when he was recorded to be 83.6 kg?  Do not see signs of fluid overload at this time continue to monitor trend  -10/8 weight trending up however down from weight on 10/3.  Therapy may check weight in wheelchair to compare.  No signs of fluid overload noted  -10/9 weight lower today at 70.8 kg, strongly suspect weights measurements have not been accurate given significant variation.  Does not appear to have signs of fluid overload Filed Weights   03/02/24 0607 03/03/24 0654 03/04/24 0500  Weight: 75.1 kg 75.8 kg 70.8 kg    11. COPD: On chronic prednisone  20 mg (unable to tolerate taper) -continue Brovana  BID with duo nebs prn.  12. Hypokalemia: Being supplemented intermittently. Low normal currently. 10/4 3.5, monitor  -10/6 will order today  -10/9 stable at 2.1 13. Hypomagnesemia: Improved with supplementation. Was 1.6-->2.0             -02/28/24 Mg 2.0 today, monitor.   Recheck tomorrow 14. Constipation: Last BM 09/30--on senna-->increase to 2 tabs daily and continue miralax  BID.             --MOM today. Increase as needed.  -02/29/24 LBM yesterday small, pt doesn't recall it,  remain on current regimen for now and monitor output.  -10/6 Sorbitol 60ml today 10/7 LBM large yesterday improved 10/9 LBM today stable continue monitor   15.  Neuropathy: Followed by [facility].  --On Lyrica  75 mg TID --off duloxetine .  -- of note, ordered as 150mg  daily +75mg  nightly  - Patient was followed by neurology see #[id number]   16. Anemia: Continue iron  supplement.   - 10/9 slightly lower at 9.1 continue to monitor trend 17. H/o BPH: Foley remains in place-->work on bowel regimen today and remove foley to start voiding trial tomorrow. Continue Flomax  0.4mg  nightly  -02/28/24  foley out this morning, nursing to monitor PVRs  -02/29/24 good UOP and low PVRs, cont monitoring  - 10/6 remains continent PVR 59 and 0, continue to follow  -10/8 continent bladder scans do not appear to be elevated-DC routine scans   18.  Right shoulder rotator cuff tear.  [facility] - Has outpatient CT shoulder ordered; would consider getting while he is inpatient to help plan outpatient surgery             - No formal weightbearing precautions  19. Azotemia  -Increase BUN  -encourage oral fluids, appears to be off Lasix  currently  - Recheck pending today  20.  Possible orthostatic hypotension  - 10/7 will monitor for symptoms with therapy, baseline BP appears stable.  Abdominal binder if needed.  Encourage fluids  -10/8 continue to monitor BP with therapy, encourage fluids, LE compression/abd binder if needed      03/04/2024    5:57 AM 03/04/2024    5:00 AM 03/03/2024    8:04 PM  Vitals with BMI  Weight  156 lbs   BMI  20.02   Systolic 110  119  Diastolic 67  64  Pulse 78  79   21.  Memory changes.  Appears more subacute versus chronic.  He has been following with neurology.  Reviewed recent notes patients wife indicated he had stable MRI however neurology note 7/30 indicates MRI was declined.  Neurology was considering referral to neuromuscular specialist for his neuropathy.  - Continue follow-up with outpatient neurology, consideration of neuropsych testing     LOS: 6 days A FACE TO FACE EVALUATION WAS PERFORMED  Murray Collier 03/04/2024, 12:31 PM

## 2024-03-04 NOTE — Progress Notes (Signed)
 Physical Therapy Session Note  Patient Details  Name: Levi [name] MRN: [id number] Date of Birth: 12-20-1945  Today's Date: 03/04/2024 PT Individual Time: 0846-0910 PT Individual Time Calculation (min): 24 min   Short Term Goals: Week 1:  PT Short Term Goal 1 (Week 1): pt will perform sit to stand with LRAD and mod A PT Short Term Goal 2 (Week 1): PT will contact [employer] for R LE prosthetic delivery and fit to pt PT Short Term Goal 3 (Week 1): pt will perform bed to chair transfer with min A consistently  Skilled Therapeutic Interventions/Progress Updates:   Received pt sitting in WC, pt agreeable to PT treatment, and reported pain in R shoulder (unrated but declined any pain medication). Session with emphasis on functional mobility/transfers, generalized strengthening and endurance, WC mobility, and ambulation. Donned gel liner and prosthetic with mod A and pt performed WC mobility 165ft x 2 trials using BLE and supervision to/from dayroom with emphasis on UE strength/endurance. Stood x 2 trials from [facility] with RW and max A and ambulated 47ft with RW and min A (+2 for WC follow) - limited by pain on lateral aspect of limb rubbing against socket. Returned to room and concluded session with pt sitting in WC, needs within reach, and seatbelt alarm on. MD at bedside.   Therapy Documentation Precautions:  Precautions Precautions: Fall Recall of Precautions/Restrictions: Intact Precaution/Restrictions Comments: R BKA Restrictions Weight Bearing Restrictions Per Provider Order: No Other Position/Activity Restrictions: chronic R BKA, does not have prosthetic yet but was supposed to pick up from [employer] this week, MRSA  Therapy/Group: Individual Therapy Therisa HERO Zaunegger Therisa Stains PT, DPT 03/04/2024, 7:10 AM

## 2024-03-04 NOTE — Progress Notes (Signed)
 Occupational Therapy Session Note  Patient Details  Name: Levi [name] MRN: [id number] Date of Birth: May 01, 1946  Today's Date: 03/04/2024 OT Individual Time: 1048-1200 OT Individual Time Calculation (min): 72 min    Short Term Goals: Week 1:  OT Short Term Goal 1 (Week 1): The pt will safely complete LB bathing/dressing with ModI incorporating AE as needed. OT Short Term Goal 2 (Week 1): The pt will safely complete all functional t/f 's using a sliding board at [employer] OT Short Term Goal 3 (Week 1): The pt will safely participate in > 30 minutes of Occupational Therapy activity with minimal rest breaks. OT Short Term Goal 4 (Week 1): The pt will improve core strength by 1 grade to improve safety and Ind for functional task completion.  Skilled Therapeutic Interventions/Progress Updates:     Pt received sitting up in wc with wife present in room. Pt presenting to be in good spirits receptive to skilled OT session reporting 0/10 pain- OT offering intermittent rest breaks, repositioning, and therapeutic support to optimize participation in therapy session. Focused this session on ADL retraining, activity tolerance, sit<>stands in preparation for ADLs, and endurance training. Pt requesting to get washed up and change clothes. Doffed [place] shirt SUP and pants with MIN A completing lateral leans to bring pants off waist. Engaged Pt in completing sponge bath seated at sink- UB bathing completed with SUP and LB bathing completed MOD A. He was able to wash his upper legs and anterior peri-areas in seated position with set-up A. Weaved L LE and R limb into pants and donned R prosthetic with MIN A to apply adequate pressure when donning prosthesis. Completed sit<>stands x3 trials from w/c with MIN A +2 following verbal cues on technique and maintained standing balance with B UE support on sink 30 sec -1 min x3 trials while OT assisted with LB bathing and bringing pants to waist. Pt impressed with his standing  tolerance and increased independence in sit<>stands this session when wearing his R prosthesis. W/c propulsion completed to therapy gym for endurance training with Pt able to complete ~150 ft with 2 rest breaks with SUP, min verbal cues for technique. Engaged Pt in completing UB endurance training on NuStep to improve activity tolerance for ADLs. He completed total of 5 min on level 3 setting with seated rest break provided following. Lateral scoot transfer completed wc<>NuStep with MOD A required when going to R and MIN A to L with assistance required to fully lift bottom. Transported Pt back to room total A in wc for energy conservation and time management. Pt was left resting in wc with call bell in reach, seatbelt alarm on, and all needs met.    Therapy Documentation Precautions:  Precautions Precautions: Fall Recall of Precautions/Restrictions: Intact Precaution/Restrictions Comments: R BKA Restrictions Weight Bearing Restrictions Per Provider Order: No Other Position/Activity Restrictions: chronic R BKA, does not have prosthetic yet but was supposed to pick up from [employer] this week, MRSA   Therapy/Group: Individual Therapy  Levi [name] 03/04/2024, 12:53 PM

## 2024-03-04 NOTE — Group Note (Signed)
 Patient Details Name: Levi [name] MRN: [id number] DOB: [birth year] Today's Date: 03/04/2024  Time Calculation:   PT Group Time Calculation PT Group Start Time: 1000 PT Group Stop Time: 1045 PT Group Time Calculation (min): 45 min    Group Description: Amputee: Patient participated in limb loss group in social setting with emphasis on stress management and coping strategies to manage new diagnosis to allow for improved mental health to improve overall quality of life. Provided active listening, emotional support and therapeutic use of self. Education provided on prosthetic fitting timeline as well as pain management strategies including desensitization techniques and mirror therapy. Issued and reviewed handouts on balanced amputee wellness and educational communities, amputee support group of the triad , contracture prevention, limb protector, and shrinker wear/care guidelines. Also discussed activity modification in relation to patient's goals and hobbies. Patient actively engaged throughout and appreciative of group session.  Individual level documentation: Pt transported back to room by wife, pt left in w/c with all needs within reach and chair alarm/bed alarm activated.   Pain: Pain Assessment Pain Scale: 0-10 Pain Score: 0-No pain  Precautions:    Levi [name] 03/04/2024, 11:52 AM

## 2024-03-04 NOTE — Plan of Care (Signed)
  Problem: Consults Goal: RH GENERAL PATIENT EDUCATION Description: See Patient Education module for education specifics. Outcome: Progressing   Problem: RH BOWEL ELIMINATION Goal: RH STG MANAGE BOWEL WITH ASSISTANCE Description: STG Manage Bowel with mod I  Assistance. Outcome: Progressing Goal: RH STG MANAGE BOWEL W/MEDICATION W/ASSISTANCE Description: STG Manage Bowel with Medication with mod I  Assistance. Outcome: Progressing   Problem: RH BLADDER ELIMINATION Goal: RH STG MANAGE BLADDER WITH ASSISTANCE Description: STG Manage Bladder With toileting Assistance Outcome: Progressing Goal: RH STG MANAGE BLADDER WITH MEDICATION WITH ASSISTANCE Description: STG Manage Bladder With Medication With mod I Assistance. Outcome: Progressing   Problem: RH SKIN INTEGRITY Goal: RH STG SKIN FREE OF INFECTION/BREAKDOWN Description: Patient and wife will be able to manage skin with min assist Outcome: Progressing Goal: RH STG MAINTAIN SKIN INTEGRITY WITH ASSISTANCE Description: STG Maintain Skin Integrity With min Assistance. Outcome: Progressing Goal: RH STG ABLE TO PERFORM INCISION/WOUND CARE W/ASSISTANCE Description: STG Able To Perform Incision/Wound Care With Assistance. Outcome: Progressing   Problem: RH SAFETY Goal: RH STG ADHERE TO SAFETY PRECAUTIONS W/ASSISTANCE/DEVICE Description: STG Adhere to Safety Precautions With cues Assistance/Device. Outcome: Progressing   Problem: RH PAIN MANAGEMENT Goal: RH STG PAIN MANAGED AT OR BELOW PT'S PAIN GOAL Description: Pain < 4 with prns Outcome: Progressing   Problem: RH KNOWLEDGE DEFICIT GENERAL Goal: RH STG INCREASE KNOWLEDGE OF SELF CARE AFTER HOSPITALIZATION Description: Patient and wife will be able to manage care at discharge using educational resources independently Outcome: Progressing

## 2024-03-04 NOTE — Progress Notes (Signed)
 Physical Therapy Session Note  Patient Details  Name: Levi [name] MRN: [id number] Date of Birth: 04-10-1946  Today's Date: 03/04/2024 PT Individual Time: 1302-1400 PT Individual Time Calculation (min): 58 min   Short Term Goals: Week 1:  PT Short Term Goal 1 (Week 1): pt will perform sit to stand with LRAD and mod A PT Short Term Goal 2 (Week 1): PT will contact [employer] for R LE prosthetic delivery and fit to pt PT Short Term Goal 3 (Week 1): pt will perform bed to chair transfer with min A consistently  Skilled Therapeutic Interventions/Progress Updates:    Pt presents up in w/c and agreeable to session. Focused on functional strengthening and endurance with sit <> stands, transfers, and bed mobility. Pt wife assisted with donning of prosthesis. Cues provided for technique. Pt reporting fatigue from sessions today. Requesting to use bathroom. Performed sit to stand with max A and stand step transfer with RW to the toilet (+2 for safety and to assist with clothing management). Required multiple sit <> stands with max A with RW and cues for hand placement and anterior weightshift and cues for hip and knee extension to sustain standing - required several times due to fatigue with standing tolerance and management of clothing and changing of dressing on sacrum. Transferred with mod A off of toilet due to fatigue with squat pivot transfer and cues for hand placement and technique. Transported to gym and focused on sit <> stands with anterior weightshift and upright standing tolerance  about 30 sec -1 min with max A for sit <> Stands. Returned to room and transferred back to bed with mod A for squat pivot and facilitation of weightshift. Min A to return to supine due to LLE management and hand off to wife to assist with donning of shrinker and lotion.   Therapy Documentation Precautions:  Precautions Precautions: Fall Recall of Precautions/Restrictions: Intact Precaution/Restrictions Comments: R  BKA Restrictions Weight Bearing Restrictions Per Provider Order: No Other Position/Activity Restrictions: chronic R BKA, does not have prosthetic yet but was supposed to pick up from [employer] this week, MRSA  Pain: C/o pain in R shoulder - chronic pain - RN notified for pain medication.    Therapy/Group: Individual Therapy  Elnor Pizza Sherrell Pizza WENDI Elnor, PT, DPT, CBIS  03/04/2024, 3:14 PM

## 2024-03-04 NOTE — Patient Care Conference (Signed)
 Inpatient RehabilitationTeam Conference and Plan of Care Update Date: 03/03/2024   Time: 09:43 AM    Patient Name: Levi [name]      Medical Record Number: [id number]  Date of Birth: 10/21/1945 Sex: Male         Room/Bed: 4W09C/4W09C-01 Payor Info: Payor: MEDICARE / Plan: MEDICARE PART A AND B / Product Type: *No Product type* /    Admit Date/Time:  02/27/2024  3:03 PM  Primary Diagnosis:  Debility  Hospital Problems: Principal Problem:   Debility Active Problems:   Acute heart failure with mildly reduced ejection fraction (HFmrEF) (HCC)   Leukocytosis    Expected Discharge Date: Expected Discharge Date: 03/12/24  Team Members Present: Physician leading conference: Dr. Murray Collier Social Worker Present: Waverly Gentry, LCSW-A Nurse Present: Barnie Ronde, RN PT Present: Doreene Orris, PT OT Present: Delon Sharps, OT SLP Present: Rosina Downy, SLP     Current Status/Progress Goal Weekly Team Focus  Bowel/Bladder   Patient is continent of bladder but will occasionally spill the urinal. He is continent/incontinent of bowel. Last BM 10/6.   Patient will be continent of bowel and bladder.   Provide prompted toileting. Bladder scan every 4-6 hours (minimal volume noted on scans and not requiring in and out caths).    Swallow/Nutrition/ Hydration               ADL's                Mobility   pt overall level of assist fluctuates significantly with fatigue/dizziness: bed mobility supervision-mod A, sit to stand +2 min A with new R LE prosthetic from raised surface and RW. Slide board transfer min-total A. WC mobility supervision/min A (navigating turns). ambulation x 8 feet in // bars with +2 for WC follow and R LE prosthetic.   supervision/min A  D/C: 10/17, follow up HHPT versus OPPT (i believe pt had begun OPPT wiht prosthetic training prior to admission, but i need to verify with pt wife.  DME: pt has hospital bed, RW, WC, slide board from previous CIR stay.  barriers to discharge: cognitive deficits, new R LE prosthesis, pain/dizziness/fatigue.    Communication                Safety/Cognition/ Behavioral Observations               Pain   Patient complains of chronic right shoulder pain. PRN oxycodone  on MAR and being used to treat pain.   Patient will have a pain score less than 4/10   Assess pain every shift and as needed. Provide pain intervention when necessary.    Skin   Scattered bruising. Redness to sacrum and buttocks. Left buttock is edematous. Scattered abrasions mostly scabbed. One abrasion to left thigh covered with foam.   Patient will have continued wound healing and be free from further skin breakdown.  Assess skin every shift and as needed. Monitor sacrum for further breakdown. Promote pressure reduction. Keep patient dry to prevent MASD.      Discharge Planning:  Patient plans to discharge home with his spouse, Levi [name] and daughter providing care/assistance when necessary. Has DME: Hospital bed, wheelchair and slide board. HH with [name] PT/OT/SN. Awaiting therapy follow up recommendations.   Team Discussion: Patient admitted with debility post MRSA bacteremia, remote BKA (prosthetic delivered 03/02/24) with cellulitis/?abscess on IV abx thru 03/22/24.   Patient on target to meet rehab goals: yes, currently needs min assist for slide board transfers and able to  ambulate up to 8' in the parallel bars. Goals for discharge set for supervision overall.   *See Care Plan and progress notes for long and short-term goals.   Revisions to Treatment Plan:  OP neurology appointment SLP eval added for gradual cognitive decline   Teaching Needs: Safety, medications, residual limb care/prosthetic wear/care, skin care, wound care, infection prevention, transfers, toileting, etc.   Current Barriers to Discharge: Decreased caregiver support and Wound care  Possible Resolutions to Barriers: Family education OP follow up  services IV abx through 03/22/24     Medical Summary Current Status: debility, HFmrEF, COPD, hypokalemia, constipation, neuropathy, BPH, orthostatic hypotension, constipation  Barriers to Discharge: Electrolyte abnormality;Hypotension;Medical stability;Self-care education  Barriers to Discharge Comments: debility, HFmrEF, COPD, hypokalemia, constipation, neuropathy, BPH, orthostatic hypotension, constipation Possible Resolutions to [employer] Focus: monitor bladder function, hypotension, monitor bowel function, abx, follow wt, recheck labs tomorrow   Continued Need for Acute Rehabilitation Level of Care: The patient requires daily medical management by a physician with specialized training in physical medicine and rehabilitation for the following reasons: Direction of a multidisciplinary physical rehabilitation program to maximize functional independence : Yes Medical management of patient stability for increased activity during participation in an intensive rehabilitation regime.: Yes Analysis of laboratory values and/or radiology reports with any subsequent need for medication adjustment and/or medical intervention. : Yes   I attest that I was present, lead the team conference, and concur with the assessment and plan of the team.   Fredericka Sober B 03/04/2024, 11:49 AM

## 2024-03-04 NOTE — Progress Notes (Signed)
 Patient ID: Levi [name], male   DOB: 12-May-1946, 78 y.o.   MRN: [id number]  Have reviewed team conference with pt and family. Both aware and agreeable with targeted d/c date of 10/17 and goals of Contact Guard/Touching assist.   Will be set up with [employer] for IV education with [facility] and [facility] PT/OT with [facility] or [facility].

## 2024-03-05 ENCOUNTER — Ambulatory Visit: Admitting: Sports Medicine

## 2024-03-05 DIAGNOSIS — M25511 Pain in right shoulder: Secondary | ICD-10-CM

## 2024-03-05 DIAGNOSIS — B9562 Methicillin resistant Staphylococcus aureus infection as the cause of diseases classified elsewhere: Secondary | ICD-10-CM

## 2024-03-05 DIAGNOSIS — R7881 Bacteremia: Secondary | ICD-10-CM

## 2024-03-05 NOTE — Progress Notes (Signed)
 Physical Therapy Note  Patient Details  Name: BREKEN NAZARI MRN: [id number] Date of Birth: [birth year] Today's Date: 03/05/2024    Pt missed 60 min skilled PT in group setting due to significant back pain and refusal. Will continue efforts as schedule permits.    Jaquitta Dupriest 03/05/2024, 4:12 PM

## 2024-03-05 NOTE — Progress Notes (Signed)
 Occupational Therapy Session Note  Patient Details  Name: Levi [name] MRN: [id number] Date of Birth: 1945-11-13  Today's Date: 03/05/2024 OT Individual Time: 9076-8964 OT Individual Time Calculation (min): 72 min    Short Term Goals: Week 1:  OT Short Term Goal 1 (Week 1): The pt will safely complete LB bathing/dressing with ModI incorporating AE as needed. OT Short Term Goal 2 (Week 1): The pt will safely complete all functional t/f 's using a sliding board at [employer] OT Short Term Goal 3 (Week 1): The pt will safely participate in > 30 minutes of Occupational Therapy activity with minimal rest breaks. OT Short Term Goal 4 (Week 1): The pt will improve core strength by 1 grade to improve safety and Ind for functional task completion.  Skilled Therapeutic Interventions/Progress Updates:  Pt greeted seated in w/c, pt agreeable to OT intervention.    Wife present during session.   Transfers/bed mobility/functional mobility:  Session focused on various functional transfers to aid in ADL participation. Pt able to complete w/c mobility ~ 50 ft with supervision. Pt completed slide board transfer to mat table to R side, total A for board placement but pt able to complete slide board transfer with CGA.  Practiced sit>stands from EOM at 22 inches with heavy MODA, pt tends to stand with flexed posture with hips in extension.   Pt able to transition to practicing sit>stands from w/c with a focus on anterior weight shift and forward lean when powering up to stand. Pt completed multiple reps of sit>stands with heavy MODA.   Therapeutic activity:  Instructed pt on dynamic balance/standing tolerance task with pt instructed to stand from [facility] and place horseshoes OH on mirror with LUE. Pt able to don 3 horseshoes at a time before needing to sit d/t fatigue. Pt completed x5 reps.                  Ended session with pt seated in w/c with all needs within reach and wife present.  Precautions:   Precautions Precautions: Fall Recall of Precautions/Restrictions: Intact Precaution/Restrictions Comments: R BKA Restrictions Weight Bearing Restrictions Per Provider Order: No Other Position/Activity Restrictions: chronic R BKA, does not have prosthetic yet but was supposed to pick up from [facility] this week, MRSA  Pain:    Therapy/Group: Individual Therapy  Ronal Mallie Needy 03/05/2024, 12:13 PM

## 2024-03-05 NOTE — Plan of Care (Signed)
  Problem: RH BOWEL ELIMINATION Goal: RH STG MANAGE BOWEL WITH ASSISTANCE Description: STG Manage Bowel with mod I  Assistance. Outcome: Progressing   Problem: RH SKIN INTEGRITY Goal: RH STG MAINTAIN SKIN INTEGRITY WITH ASSISTANCE Description: STG Maintain Skin Integrity With min Assistance. Outcome: Progressing

## 2024-03-05 NOTE — Progress Notes (Addendum)
 PROGRESS NOTE   Subjective/Complaints: Patient working in the gym this morning, reports doing well overall.  Patient asked about potential surgery for his right shoulder.  He has been recently evaluated 02/18/2024 by Dr. Addie for end-stage right shoulder rotator cuff arthropathy with plans for CT scan with follow-up after this, elective joint replacement felt to be risky with comorbidities.  ROS: as per HPI. Denies CP, SOB, abd pain, N/V/D/constipation, or any other complaints at this time.   + constipation-improved LBM yesterday + Right shoulder pain chronic + Memory changes over several months-no recent changes Objective:   No results found.  Recent Labs    03/04/24 0447  WBC 8.4  HGB 9.1*  HCT 29.5*  PLT 242   Recent Labs    03/04/24 1237  NA 140  K 3.8  CL 103  CO2 24  GLUCOSE 130*  BUN 25*  CREATININE 0.88  CALCIUM  8.9         Intake/Output Summary (Last 24 hours) at 03/05/2024 1233 Last data filed at 03/05/2024 9177 Gross per 24 hour  Intake 1211.84 ml  Output 575 ml  Net 636.84 ml     Wound 02/24/24 0442 Pressure Injury Buttocks Left Stage 2 -  Partial thickness loss of dermis presenting as a shallow open injury with a red, pink wound bed without slough. (Active)    Physical Exam: Vital Signs Blood pressure (!) 139/58, pulse 67, temperature 98.7 F (37.1 C), temperature source Oral, resp. rate 18, height 6' 2 (1.88 m), weight 70.8 kg, SpO2 98%.  Physical Exam Vitals and nursing note reviewed.  Constitutional: No apparent distress. Appropriate appearance for age.  Working with therapy in the gym HENT: No JVD. Neck Supple. Trachea midline. Atraumatic, normocephalic. Eyes: PERRLA. EOMI. Visual fields grossly intact.  Cardiovascular: reg rhythm, bradycardic, no murmurs/rub/gallops.  Respiratory: CTAB. No rales, rhonchi, or wheezing. On RA.  Abdomen: + bowel sounds, normoactive. No distention or  tenderness.   Psych: Pleasant cooperative Ext: R BKA wearing prosthetic device,  Neuro: A&O x 4, follows commands, fluent, cranial nerves II through XII grossly intact,, follows commands.   working on sit to stand with therapy MSK pain with right shoulder passive ROM  PRIOR EXAMS: Skin: Papery skin with diffuse ecchymotic and flaking BUE. RUE with evidence of third spacing and dependent edema around elbow.  LLE with multiple scabs covered with foam dressing.  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  Pressure area with scab on tibial prominence.     MSK:      + R BKA       + L second toe amputation       + Right shoulder range of motion 0 degrees in flexion, abduction; right shoulder visibly protruding forward from the socket.  No crepitus with passive range of motion.       Neurologic exam:  Cognition: AAO to person, place, time and event.  Language: Fluent, No substitutions or neoglisms. No dysarthria. Names 3/3 objects correctly.  Memory: Recalls 3/3 objects at 5 minutes. No apparent deficits  Insight: Good  insight into current condition.  Mood: Pleasant affect, appropriate mood.  Sensation: To light reduced in left foot  and bilateral fingertips in stocking glove pattern.  + Difficulty with proprioception in right hand digits 3 through 5   Reflexes: 2+ in BL UE and LEs. Negative Hoffman's and babinski signs bilaterally.  CN: 2-12 grossly intact.  Coordination: No apparent tremors. No ataxia  Spasticity: MAS 0 in all extremities.       Strength:                RUE: 0/5 SA, 2/5 EF, 3/5 EE, 4/5 WE, 4/5 FF, 4/5 FA                LUE:  4/5 SA, 4/5 EF, 4/5 EE, 4/5 WE, 4/5 FF, 4/5 FA                RLE: 4/5 HF, 5/5 KE                 LLE:  4/5 HF, 5/5 KE, 3/5  DF, 3/5  EHL, 2/5  PF  Assessment/Plan: 1. Functional deficits which require 3+ hours per day of interdisciplinary therapy in a comprehensive inpatient rehab setting. Physiatrist is providing close team  supervision and 24 hour management of active medical problems listed below. Physiatrist and rehab team continue to assess barriers to discharge/monitor patient progress toward functional and medical goals  Care Tool:  Bathing    Body parts bathed by patient: Right arm, Left arm, Chest, Abdomen, Front perineal area, Right upper leg, Left upper leg, Face   Body parts bathed by helper: Buttocks, Right lower leg, Left lower leg     Bathing assist Assist Level: Moderate Assistance - Patient 50 - 74%     Upper Body Dressing/Undressing Upper body dressing   What is the patient wearing?: Pull over shirt    Upper body assist Assist Level: Set up assist    Lower Body Dressing/Undressing Lower body dressing      What is the patient wearing?: Pants     Lower body assist Assist for lower body dressing: Maximal Assistance - Patient 25 - 49%     Toileting Toileting    Toileting assist Assist for toileting: Moderate Assistance - Patient 50 - 74%     Transfers Chair/bed transfer  Transfers assist     Chair/bed transfer assist level: Moderate Assistance - Patient 50 - 74%     Locomotion Ambulation   Ambulation assist   Ambulation activity did not occur: Safety/medical concerns  Assist level: 2 helpers Assistive device: Walker-rolling Max distance: 70ft   Walk 10 feet activity   Assist  Walk 10 feet activity did not occur: Safety/medical concerns        Walk 50 feet activity   Assist Walk 50 feet with 2 turns activity did not occur: Safety/medical concerns         Walk 150 feet activity   Assist Walk 150 feet activity did not occur: Safety/medical concerns         Walk 10 feet on uneven surface  activity   Assist Walk 10 feet on uneven surfaces activity did not occur: Safety/medical concerns         Wheelchair     Assist Is the patient using a wheelchair?: Yes Type of Wheelchair: Manual    Wheelchair assist level: Supervision/Verbal  cueing Max wheelchair distance: 75    Wheelchair 50 feet with 2 turns activity    Assist        Assist Level: Supervision/Verbal cueing   Wheelchair 150 feet activity     Assist  Assist Level: Moderate Assistance - Patient 50 - 74%   Blood pressure (!) 139/58, pulse 67, temperature 98.7 F (37.1 C), temperature source Oral, resp. rate 18, height 6' 2 (1.88 m), weight 70.8 kg, SpO2 98%.  Medical Problem List and Plan: 1. Functional deficits secondary to debility secondary to MRSA bacteremia d/t pneumonia             -patient may shower             -ELOS/Goals: 14-16 days, [facility] PT/OT              -Continue CIR - Patient has BKA prosthetic device, continue prosthetic training -PT seen by SLP- did well overall - Expected discharge 10/17    2.  Antithrombotics: -DVT/anticoagulation:  Pharmaceutical: Eliquis  5mg  BID             -antiplatelet therapy: N/A 3. Pain Management: Oxycodone  prn for severe pain. Tylenol  prn for  mild pain.   - 10/10 infrequent use of oxycodone .  Reviewed orthopedic note regarding right shoulder.  Patient seen by Dr. Addie with outpatient CT scan pending.  Follow-up with Dr. Addie outpatient after this is completed.  Shoulder surgery felt to be risky by Ortho due to comorbidities. 4. Mood/Behavior/Sleep: LCSW to follow for evaluation and support.              --trazodone  prn for insomnia. Melatonin 5mg  nightly             -antipsychotic agents: N/A 5. Neuropsych/cognition: This patient is capable of making decisions on his own behalf. 6. Skin/Wound Care:  Routine pressure relief measures.    7. Fluids/Electrolytes/Nutrition:  Monitor I/O. Monitor labs. Check CK qMon.   -GERD: protonix  40mg  BID  -02/28/24 CMP stable 8. MRSA bacteremia: On Daptomycin X 4 weeks with EOT 03/22/24 --Ck-119 on 09/29. Recheck on Monday.   --Recurrent leucocytosis 13.7-->9.4-->11.8 -02/28/24 WBC 11.0 today, monitor.  - 10/9 WBC down to 8.4 -10/10 patient asked  about contact precautions, will need to continue due to MRSA 9. Indurated area left buttock/cellulitis?: Monitor for worsening. Augmentin  added on 09/30 w/ antibiotic duration to continue pending improvement.  --Repeat imaging in case of worsening to rule out abscess formation.   10. A fib: Monitor HR TID--on Eliquis . HR controlled on cardizem  180mg  daily.   11. HFmrEF: Monitor daily weights and for signs of overload.             --On Lipitor 20mg  daily, Eliquis --off Imdur  due to soft BP.    -02/28/24 added daily weights order  -02/29/24 wt down 10kg but highly doubt accuracy; monitor for trend  -10/7 weight trending up again but weight still down from 10/3 when he was recorded to be 83.6 kg?  Do not see signs of fluid overload at this time continue to monitor trend  -10/8 weight trending up however down from weight on 10/3.  Therapy may check weight in wheelchair to compare.  No signs of fluid overload noted  -10/9 weight lower today at 70.8 kg, strongly suspect weights measurements have not been accurate given significant variation.  Does not appear to have signs of fluid overload  - Recheck weight for today pending, no signs of fluid overload Filed Weights   03/02/24 0607 03/03/24 0654 03/04/24 0500  Weight: 75.1 kg 75.8 kg 70.8 kg    11. COPD: On chronic prednisone  20 mg (unable to tolerate taper) -continue Brovana  BID with duo nebs prn.  12. Hypokalemia: Being supplemented intermittently. Low normal currently. 10/4  3.5, monitor  -10/6 will order today  -10/9 stable at 2.1 13. Hypomagnesemia: Improved with supplementation. Was 1.6-->2.0             -02/28/24 Mg 2.0 today, monitor.   Recheck tomorrow 14. Constipation: Last BM 09/30--on senna-->increase to 2 tabs daily and continue miralax  BID.             --MOM today. Increase as needed.  -02/29/24 LBM yesterday small, pt doesn't recall it, remain on current regimen for now and monitor output.  -10/6 Sorbitol 60ml today 10/7 LBM  large yesterday improved 10/10 LBM yesterday continue to monitor   15.  Neuropathy: Followed by [facility].  --On Lyrica  75 mg TID --off duloxetine .  -- of note, ordered as 150mg  daily +75mg  nightly  - Patient was followed by neurology see #[id number]   16. Anemia: Continue iron  supplement.   - 10/9 slightly lower at 9.1 continue to monitor trend 17. H/o BPH: Foley remains in place-->work on bowel regimen today and remove foley to start voiding trial tomorrow. Continue Flomax  0.4mg  nightly  -02/28/24 foley out this morning, nursing to monitor PVRs  -02/29/24 good UOP and low PVRs, cont monitoring  - 10/6 remains continent PVR 59 and 0, continue to follow  -10/8 continent bladder scans do not appear to be elevated-DC routine scans   18.  Right shoulder rotator cuff tear.  Last seen outpatient by Dr. Addie at [facility]. - Has outpatient CT shoulder ordered; would consider getting while he is inpatient to help plan outpatient surgery             - No formal weightbearing precautions  19. Azotemia  -Increase BUN  -encourage oral fluids, appears to be off Lasix  currently  - Recheck pending today  20.  Possible orthostatic hypotension  - 10/7 will monitor for symptoms with therapy, baseline BP appears stable.  Abdominal binder if needed.  Encourage fluids  -10/8 continue to monitor BP with therapy, encourage fluids, LE compression/abd binder if needed  - 10/10 denies recent symptoms continue to monitor      03/05/2024    3:39 AM 03/04/2024    8:22 PM 03/04/2024    2:26 PM  Vitals with BMI  Systolic 139 108 890  Diastolic 58 62 58  Pulse 67 80 84   21.  Memory changes.  Appears more subacute versus chronic.  He has been following with neurology.  Reviewed recent notes patients wife indicated he had stable MRI however neurology note 7/30 indicates MRI was declined.  Neurology was considering referral to neuromuscular specialist for his neuropathy.  - Continue follow-up with outpatient  neurology, consideration of neuropsych testing     LOS: 7 days A FACE TO FACE EVALUATION WAS PERFORMED  Levi [name] 03/05/2024, 12:33 PM

## 2024-03-05 NOTE — Progress Notes (Signed)
 Physical Therapy Session Note  Patient Details  Name: Levi [name] MRN: [id number] Date of Birth: 1945-06-08  Today's Date: 03/05/2024 PT Individual Time: 0806-0916 PT Individual Time Calculation (min): 70 min   Short Term Goals: Week 1:  PT Short Term Goal 1 (Week 1): pt will perform sit to stand with LRAD and mod A PT Short Term Goal 2 (Week 1): PT will contact [employer] for R LE prosthetic delivery and fit to pt PT Short Term Goal 3 (Week 1): pt will perform bed to chair transfer with min A consistently  Skilled Therapeutic Interventions/Progress Updates:      Pt semi-reclined in bed upon arrival. Patient reports his usual R shoulder pain but agreeable to therapy.  Session emphasized functional strengthening and endurance with sit <> stands, transfers, standing with RW, and ambulation with RW. Seated balance and trunk control also challenged while seated EOB with CGA in order to doff shrinkers and don prosthesis with max A. Pt requested to use bathroom. Pt performed stand pivot transfer from EOB to WC using RW with mod A (height of bed raised) and verbal cueing for proper/safe sequencing. Pt performed stand pivot transfer WC <> BSC over toilet using grab bars with B UE and mod/max A. Multiple sit to stand transfers using grab bars with max A (+2 for safety) required for toilet hygiene and clothing management due to fatigue and limited standing tolerance.  Pt transported to main gym dependent in [facility] for time/energy conservation. Pt performed sit <> stand transfers WC to RW with mod A (+2 for safety) x 3 trials with seated rest breaks in between due to fatigue and R shoulder pain. While in standing position patient performed R/L lateral weightshifts to improve tolerance with weightbearing through R LE with new prosthesis and for carryover into gait.  Pt ambulated 1x9 feet using RW with mod A (+2 for safety). Pt required max A for stand to sit due to fatigue.  Patient required verbal cues  throughout treatment during transfers, standing, and gait for hand placement, anterior weightshift, foot placement, and for hip and knee extension to sustain standing.  Pt transported back to room dependent in [facility] for time/energy conservation. While seated in WC pt instructed in and performed the following LE strengthening exercises: 2x10 hip abduction with yellow TB and 1x10 LAQ R LE with prosthesis donned for carryover into functional mobility and transfers. Plan to provide patient with illustrated handout for HEP at next session. Patient mentioned some phantom heel pains during seated exercises but stated pain level was tolerable.  Pt remained in WC, seatbelt alarm donned, comfortable and all needs within reach upon PT exiting room.  Therapy Documentation Precautions:  Precautions Precautions: Fall Recall of Precautions/Restrictions: Intact Precaution/Restrictions Comments: R BKA Restrictions Weight Bearing Restrictions Per Provider Order: No Other Position/Activity Restrictions: chronic R BKA, does not have prosthetic yet but was supposed to pick up from [facility] this week, MRSA  Therapy/Group: Individual Therapy  Levi [name], DPT 03/05/2024, 7:38 AM

## 2024-03-06 NOTE — Progress Notes (Signed)
 PROGRESS NOTE   Subjective/Complaints:  Pt doing well, slept well, pain manageable overall, LBM yesterday, urinating fine. No other complaints or concerns.   ROS: as per HPI. Denies CP, SOB, abd pain, N/V/D/constipation, or any other complaints at this time.   + constipation-improved LBM yesterday + Right shoulder pain chronic + Memory changes over several months-no recent changes  Objective:   No results found.  Recent Labs    03/04/24 0447  WBC 8.4  HGB 9.1*  HCT 29.5*  PLT 242   Recent Labs    03/04/24 1237  NA 140  K 3.8  CL 103  CO2 24  GLUCOSE 130*  BUN 25*  CREATININE 0.88  CALCIUM  8.9         Intake/Output Summary (Last 24 hours) at 03/06/2024 1208 Last data filed at 03/06/2024 0856 Gross per 24 hour  Intake 812 ml  Output --  Net 812 ml     Wound 02/24/24 0442 Pressure Injury Buttocks Left Stage 2 -  Partial thickness loss of dermis presenting as a shallow open injury with a red, pink wound bed without slough. (Active)    Physical Exam: Vital Signs Blood pressure 136/64, pulse 80, temperature 97.9 F (36.6 C), temperature source Oral, resp. rate 16, height 6' 2 (1.88 m), weight 70.8 kg, SpO2 98%.  Physical Exam Vitals and nursing note reviewed.  Constitutional: No apparent distress. Appropriate appearance for age.  Resting in bed HENT: No JVD. Neck Supple. Trachea midline. Atraumatic, normocephalic. Eyes: PERRLA. EOMI. Visual fields grossly intact.  Cardiovascular: reg rhythm, reg rate, no murmurs/rub/gallops.  Respiratory: CTAB. No rales, rhonchi, or wheezing. On RA.  Abdomen: + bowel sounds, normoactive. No distention or tenderness.   Psych: Pleasant cooperative Ext: R BKA Neuro: A&O x 3, follows commands, fluent speech, remembers provider from last weekend.   MSK: pain with right shoulder passive ROM-- not reassessed  PRIOR EXAMS: Skin: Papery skin with diffuse ecchymotic and  flaking BUE. RUE with evidence of third spacing and dependent edema around elbow.  LLE with multiple scabs covered with foam dressing.  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  Pressure area with scab on tibial prominence.     MSK:      + R BKA       + L second toe amputation       + Right shoulder range of motion 0 degrees in flexion, abduction; right shoulder visibly protruding forward from the socket.  No crepitus with passive range of motion.       Neurologic exam:  Cognition: AAO to person, place, time and event.  Language: Fluent, No substitutions or neoglisms. No dysarthria. Names 3/3 objects correctly.  Memory: Recalls 3/3 objects at 5 minutes. No apparent deficits  Insight: Good  insight into current condition.  Mood: Pleasant affect, appropriate mood.  Sensation: To light reduced in left foot and bilateral fingertips in stocking glove pattern.  + Difficulty with proprioception in right hand digits 3 through 5   Reflexes: 2+ in BL UE and LEs. Negative Hoffman's and babinski signs bilaterally.  CN: 2-12 grossly intact.  Coordination: No apparent tremors. No ataxia  Spasticity: MAS 0 in all  extremities.       Strength:                RUE: 0/5 SA, 2/5 EF, 3/5 EE, 4/5 WE, 4/5 FF, 4/5 FA                LUE:  4/5 SA, 4/5 EF, 4/5 EE, 4/5 WE, 4/5 FF, 4/5 FA                RLE: 4/5 HF, 5/5 KE                 LLE:  4/5 HF, 5/5 KE, 3/5  DF, 3/5  EHL, 2/5  PF  Assessment/Plan: 1. Functional deficits which require 3+ hours per day of interdisciplinary therapy in a comprehensive inpatient rehab setting. Physiatrist is providing close team supervision and 24 hour management of active medical problems listed below. Physiatrist and rehab team continue to assess barriers to discharge/monitor patient progress toward functional and medical goals  Care Tool:  Bathing    Body parts bathed by patient: Right arm, Left arm, Chest, Abdomen, Front perineal area, Right upper leg,  Left upper leg, Face   Body parts bathed by helper: Buttocks, Right lower leg, Left lower leg     Bathing assist Assist Level: Moderate Assistance - Patient 50 - 74%     Upper Body Dressing/Undressing Upper body dressing   What is the patient wearing?: Pull over shirt    Upper body assist Assist Level: Set up assist    Lower Body Dressing/Undressing Lower body dressing      What is the patient wearing?: Pants     Lower body assist Assist for lower body dressing: Maximal Assistance - Patient 25 - 49%     Toileting Toileting    Toileting assist Assist for toileting: Moderate Assistance - Patient 50 - 74%     Transfers Chair/bed transfer  Transfers assist     Chair/bed transfer assist level: Moderate Assistance - Patient 50 - 74%     Locomotion Ambulation   Ambulation assist   Ambulation activity did not occur: Safety/medical concerns  Assist level: 2 helpers Assistive device: Walker-rolling Max distance: 29ft   Walk 10 feet activity   Assist  Walk 10 feet activity did not occur: Safety/medical concerns        Walk 50 feet activity   Assist Walk 50 feet with 2 turns activity did not occur: Safety/medical concerns         Walk 150 feet activity   Assist Walk 150 feet activity did not occur: Safety/medical concerns         Walk 10 feet on uneven surface  activity   Assist Walk 10 feet on uneven surfaces activity did not occur: Safety/medical concerns         Wheelchair     Assist Is the patient using a wheelchair?: Yes Type of Wheelchair: Manual    Wheelchair assist level: Supervision/Verbal cueing Max wheelchair distance: 75    Wheelchair 50 feet with 2 turns activity    Assist        Assist Level: Supervision/Verbal cueing   Wheelchair 150 feet activity     Assist      Assist Level: Moderate Assistance - Patient 50 - 74%   Blood pressure 136/64, pulse 80, temperature 97.9 F (36.6 C), temperature  source Oral, resp. rate 16, height 6' 2 (1.88 m), weight 70.8 kg, SpO2 98%.  Medical Problem List and Plan: 1. Functional deficits secondary  to debility secondary to MRSA bacteremia d/t pneumonia             -patient may shower             -ELOS/Goals: 14-16 days, [facility] PT/OT              -Continue CIR - Patient has BKA prosthetic device, continue prosthetic training -PT seen by SLP- did well overall - Expected discharge 10/17    2.  Antithrombotics: -DVT/anticoagulation:  Pharmaceutical: Eliquis  5mg  BID             -antiplatelet therapy: N/A 3. Pain Management: Oxycodone  prn for severe pain. Tylenol  prn for  mild pain.  - 10/10 infrequent use of oxycodone .  Reviewed orthopedic note regarding right shoulder.  Patient seen by Dr. Addie with outpatient CT scan pending.  Follow-up with Dr. Addie outpatient after this is completed.  Shoulder surgery felt to be risky by Ortho due to comorbidities. 4. Mood/Behavior/Sleep: LCSW to follow for evaluation and support.              --trazodone  prn for insomnia. Melatonin 5mg  nightly             -antipsychotic agents: N/A 5. Neuropsych/cognition: This patient is capable of making decisions on his own behalf. 6. Skin/Wound Care:  Routine pressure relief measures.    7. Fluids/Electrolytes/Nutrition:  Monitor I/O. Monitor labs. Check CK qMon.   -GERD: protonix  40mg  BID  -02/28/24 CMP stable 8. MRSA bacteremia: On Daptomycin X 4 weeks with EOT 03/22/24 --Ck-119 on 09/29. Recheck on Monday.   --Recurrent leucocytosis 13.7-->9.4-->11.8 -02/28/24 WBC 11.0 today, monitor.  - 10/9 WBC down to 8.4 -10/10 patient asked about contact precautions, will need to continue due to MRSA 9. Indurated area left buttock/cellulitis?: Monitor for worsening. Augmentin  added on 09/30 w/ antibiotic duration to continue pending improvement.  --Repeat imaging in case of worsening to rule out abscess formation.   10. A fib: Monitor HR TID--on Eliquis . HR controlled on  cardizem  180mg  daily.   11. HFmrEF: Monitor daily weights and for signs of overload.             --On Lipitor 20mg  daily, Eliquis --off Imdur  due to soft BP.    -02/28/24 added daily weights order  -02/29/24 wt down 10kg but highly doubt accuracy; monitor for trend -10/7 weight trending up again but weight still down from 10/3 when he was recorded to be 83.6 kg?  Do not see signs of fluid overload at this time continue to monitor trend -10/8 weight trending up however down from weight on 10/3.  Therapy may check weight in wheelchair to compare.  No signs of fluid overload noted -10/9 weight lower today at 70.8 kg, strongly suspect weights measurements have not been accurate given significant variation.  Does not appear to have signs of fluid overload  - Recheck weight for today pending, no signs of fluid overload Filed Weights   03/02/24 0607 03/03/24 0654 03/04/24 0500  Weight: 75.1 kg 75.8 kg 70.8 kg    11. COPD: On chronic prednisone  20 mg (unable to tolerate taper) -continue Brovana  BID with duo nebs prn.  12. Hypokalemia: Being supplemented intermittently. Low normal currently. 10/4 3.5, monitor  -10/6 will order today  -10/9 stable at 2.1 13. Hypomagnesemia: Improved with supplementation. Was 1.6-->2.0             -02/28/24 Mg 2.0 today, monitor.   Recheck tomorrow-- 2.1 on 10/9 14. Constipation: Last BM 09/30--on senna-->increase to 2  tabs daily and continue miralax  BID.             --MOM today. Increase as needed.  -02/29/24 LBM yesterday small, pt doesn't recall it, remain on current regimen for now and monitor output.  -10/6 Sorbitol 60ml today -03/06/24 LBM yesterday, cont regimen   15.  Neuropathy: Followed by [facility].  --On Lyrica  75 mg TID --off duloxetine .  -- of note, ordered as 150mg  daily +75mg  nightly  - Patient was followed by neurology see #[id number]   16. Anemia: Continue iron  supplement.   - 10/9 slightly lower at 9.1 continue to monitor trend 17. H/o BPH:  Foley remains in place-->work on bowel regimen today and remove foley to start voiding trial tomorrow. Continue Flomax  0.4mg  nightly  -02/28/24 foley out this morning, nursing to monitor PVRs  -02/29/24 good UOP and low PVRs, cont monitoring  - 10/6 remains continent PVR 59 and 0, continue to follow  -10/8 continent bladder scans do not appear to be elevated-DC routine scans   18.  Right shoulder rotator cuff tear.  Last seen outpatient [facility]. - Has outpatient CT shoulder ordered; would consider getting while he is inpatient to help plan outpatient surgery             - No formal weightbearing precautions  19. Azotemia  -Increase BUN  -encourage oral fluids, appears to be off Lasix  currently  - Recheck pending today-- BUN 25 10/9, none since then, labs Monday.  20.  Possible orthostatic hypotension - 10/7 will monitor for symptoms with therapy, baseline BP appears stable.  Abdominal binder if needed.  Encourage fluids -10/8 continue to monitor BP with therapy, encourage fluids, LE compression/abd binder if needed  - 10/10 denies recent symptoms continue to monitor   Vitals:   03/02/24 1315 03/02/24 2043 03/03/24 0552 03/03/24 1437  BP: 122/65 133/65 (!) 138/96 121/62   03/03/24 2004 03/04/24 0557 03/04/24 1426 03/04/24 2022  BP: 119/64 110/67 (!) 109/58 108/62   03/05/24 0339 03/05/24 1302 03/05/24 1924 03/06/24 0454  BP: (!) 139/58 99/71 103/64 136/64     21.  Memory changes.  Appears more subacute versus chronic.  He has been following with neurology.  Reviewed recent notes patients wife indicated he had stable MRI however neurology note 7/30 indicates MRI was declined.  Neurology was considering referral to neuromuscular specialist for his neuropathy. - Continue follow-up with outpatient neurology, consideration of neuropsych testing     LOS: 8 days A FACE TO FACE EVALUATION WAS PERFORMED  Dayvion Sans 03/06/2024, 12:08 PM

## 2024-03-07 MED ORDER — GERHARDT'S BUTT CREAM
TOPICAL_CREAM | Freq: Three times a day (TID) | CUTANEOUS | Status: DC
  Filled 2024-03-07: qty 60

## 2024-03-07 NOTE — Progress Notes (Signed)
 Occupational Therapy Session Note  Patient Details  Name: Levi [name] MRN: [id number] Date of Birth: 10-02-1945  Today's Date: 03/07/2024 OT Individual Time: 1310-1345 OT Individual Time Calculation (min): 35 min    Short Term Goals: Week 1:  OT Short Term Goal 1 (Week 1): The pt will safely complete LB bathing/dressing with ModI incorporating AE as needed. OT Short Term Goal 2 (Week 1): The pt will safely complete all functional t/f 's using a sliding board at [employer] OT Short Term Goal 3 (Week 1): The pt will safely participate in > 30 minutes of Occupational Therapy activity with minimal rest breaks. OT Short Term Goal 4 (Week 1): The pt will improve core strength by 1 grade to improve safety and Ind for functional task completion.  Skilled Therapeutic Interventions/Progress Updates:      Therapy Documentation Precautions:  Precautions Precautions: Fall Recall of Precautions/Restrictions: Intact Precaution/Restrictions Comments: R BKA Restrictions Weight Bearing Restrictions Per Provider Order: No Other Position/Activity Restrictions: chronic R BKA, does not have prosthetic yet but was supposed to pick up from [facility] this week, MRSA General:  Pt supine in bed upon OT arrival, agreeable to OT session.  Pain:  2/10 pain reported in Rt knee, activity, intermittent rest breaks, distractions provided for pain management, pt reports tolerable to proceed.   ADL: OT providing skilled intervention on functional mobility and ADL retraining. Pt able to complete supine>EOB with SBA with bed rails and HOB raised. Pt seated EOB with good trunk control and able to don prosthetic seated EOB without LOB. Pt completing multiple sit to stand and stand step transfers with RW in order to increase dynamic balance, proprioceptive input to RLE and functional mobility without LOB. Pt Mod A sit to stand from lower surface and Min-CGA for step transfer with RW.    Pt seated in W/C at end of session with  direct handoff to PT for session.   Therapy/Group: Individual Therapy  Camie Hoe, OTD, OTR/L 03/07/2024, 4:12 PM

## 2024-03-07 NOTE — Progress Notes (Signed)
 Occupational Therapy Session Note  Patient Details  Name: Levi [name] MRN: [id number] Date of Birth: 1946-05-14  Today's Date: 03/07/2024 OT Individual Time: 9154-9069 OT Individual Time Calculation (min): 45 min    Short Term Goals: Week 1:  OT Short Term Goal 1 (Week 1): The pt will safely complete LB bathing/dressing with ModI incorporating AE as needed. OT Short Term Goal 2 (Week 1): The pt will safely complete all functional t/f 's using a sliding board at [employer] OT Short Term Goal 3 (Week 1): The pt will safely participate in > 30 minutes of Occupational Therapy activity with minimal rest breaks. OT Short Term Goal 4 (Week 1): The pt will improve core strength by 1 grade to improve safety and Ind for functional task completion. Week 2:     Skilled Therapeutic Interventions/Progress Updates:    1:! Pt received int the bed. With the bed rail pt able to come to EOB with supervison. With assistance to line prosthetic liner with his residual limb pt then able to don the liner and the leg with setup. Pt reported his pants were clean but when stood to pull them up they were soiled with urine. Total A to doff and threa new ones on (over his shoes). Total A to don TEDS hose on left leg and shoe. Pt was able to maintain standing positioning for clothing management and for nursing to do a skin check for ~ 1.5.    Pt then ambulated in the hallway with heavy min to come into standing and then ambulated 16 feet. After a seated rest break required mod A to come back into standing and then ambulated another 10 feet.   PT returned to the room and left sitting up in the w/c.   Therapy Documentation Precautions:  Precautions Precautions: Fall Recall of Precautions/Restrictions: Intact Precaution/Restrictions Comments: R BKA Restrictions Weight Bearing Restrictions Per Provider Order: No Other Position/Activity Restrictions: chronic R BKA, does not have prosthetic yet but was supposed to pick up  from [facility] this week, MRSA  Pain:   offered ice but declined able to continue with extra time     Therapy/Group: Individual Therapy  Claudene Nest [facility] 03/07/2024, 12:37 PM

## 2024-03-07 NOTE — Progress Notes (Signed)
 PROGRESS NOTE   Subjective/Complaints:  Pt doing well again, slept well, pain manageable overall, LBM yesterday, urinating fine. No other complaints or concerns. Feels pretty good.   ROS: as per HPI. Denies CP, SOB, abd pain, N/V/D/constipation, or any other complaints at this time.   + constipation-improved LBM yesterday + Right shoulder pain chronic + Memory changes over several months-no recent changes  Objective:   No results found.  No results for input(s): WBC, HGB, HCT, PLT in the last 72 hours.  Recent Labs    03/04/24 1237  NA 140  K 3.8  CL 103  CO2 24  GLUCOSE 130*  BUN 25*  CREATININE 0.88  CALCIUM  8.9         Intake/Output Summary (Last 24 hours) at 03/07/2024 1056 Last data filed at 03/07/2024 0413 Gross per 24 hour  Intake 340 ml  Output 675 ml  Net -335 ml     Wound 02/24/24 0442 Pressure Injury Buttocks Left Stage 2 -  Partial thickness loss of dermis presenting as a shallow open injury with a red, pink wound bed without slough. (Active)    Physical Exam: Vital Signs Blood pressure 123/73, pulse 84, temperature 97.9 F (36.6 C), temperature source Oral, resp. rate 16, height 6' 2 (1.88 m), weight 71.2 kg, SpO2 96%.  Physical Exam Vitals and nursing note reviewed.  Constitutional: No apparent distress. Appropriate appearance for age.  Up with OT getting changed.  HENT: No JVD. Neck Supple. Trachea midline. Atraumatic, normocephalic. Eyes: PERRLA. EOMI. Visual fields grossly intact.  Cardiovascular: reg rhythm, reg rate, no murmurs/rub/gallops.  Respiratory: CTAB. No rales, rhonchi, or wheezing. On RA.  Abdomen: + bowel sounds, normoactive. No distention or tenderness.   Psych: Pleasant cooperative Ext: R BKA Neuro: A&O x 3, follows commands, fluent speech, remembers provider from last weekend.  At baseline now. MSK: pain with right shoulder passive ROM-- not  reassessed Skin: buttocks a bit reddish but blanchable  PRIOR EXAMS: Skin: Papery skin with diffuse ecchymotic and flaking BUE. RUE with evidence of third spacing and dependent edema around elbow.  LLE with multiple scabs covered with foam dressing.  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  Pressure area with scab on tibial prominence.     MSK:      + R BKA       + L second toe amputation       + Right shoulder range of motion 0 degrees in flexion, abduction; right shoulder visibly protruding forward from the socket.  No crepitus with passive range of motion.       Neurologic exam:  Cognition: AAO to person, place, time and event.  Language: Fluent, No substitutions or neoglisms. No dysarthria. Names 3/3 objects correctly.  Memory: Recalls 3/3 objects at 5 minutes. No apparent deficits  Insight: Good  insight into current condition.  Mood: Pleasant affect, appropriate mood.  Sensation: To light reduced in left foot and bilateral fingertips in stocking glove pattern.  + Difficulty with proprioception in right hand digits 3 through 5   Reflexes: 2+ in BL UE and LEs. Negative Hoffman's and babinski signs bilaterally.  CN: 2-12 grossly intact.  Coordination: No  apparent tremors. No ataxia  Spasticity: MAS 0 in all extremities.       Strength:                RUE: 0/5 SA, 2/5 EF, 3/5 EE, 4/5 WE, 4/5 FF, 4/5 FA                LUE:  4/5 SA, 4/5 EF, 4/5 EE, 4/5 WE, 4/5 FF, 4/5 FA                RLE: 4/5 HF, 5/5 KE                 LLE:  4/5 HF, 5/5 KE, 3/5  DF, 3/5  EHL, 2/5  PF  Assessment/Plan: 1. Functional deficits which require 3+ hours per day of interdisciplinary therapy in a comprehensive inpatient rehab setting. Physiatrist is providing close team supervision and 24 hour management of active medical problems listed below. Physiatrist and rehab team continue to assess barriers to discharge/monitor patient progress toward functional and medical goals  Care  Tool:  Bathing    Body parts bathed by patient: Right arm, Left arm, Chest, Abdomen, Front perineal area, Right upper leg, Left upper leg, Face   Body parts bathed by helper: Buttocks, Right lower leg, Left lower leg     Bathing assist Assist Level: Moderate Assistance - Patient 50 - 74%     Upper Body Dressing/Undressing Upper body dressing   What is the patient wearing?: Pull over shirt    Upper body assist Assist Level: Set up assist    Lower Body Dressing/Undressing Lower body dressing      What is the patient wearing?: Pants     Lower body assist Assist for lower body dressing: Maximal Assistance - Patient 25 - 49%     Toileting Toileting    Toileting assist Assist for toileting: Moderate Assistance - Patient 50 - 74%     Transfers Chair/bed transfer  Transfers assist     Chair/bed transfer assist level: Minimal Assistance - Patient > 75%     Locomotion Ambulation   Ambulation assist   Ambulation activity did not occur: Safety/medical concerns  Assist level: Minimal Assistance - Patient > 75% Assistive device: Walker-rolling Max distance: 16 feet   Walk 10 feet activity   Assist  Walk 10 feet activity did not occur: Safety/medical concerns  Assist level: Minimal Assistance - Patient > 75%     Walk 50 feet activity   Assist Walk 50 feet with 2 turns activity did not occur: Safety/medical concerns         Walk 150 feet activity   Assist Walk 150 feet activity did not occur: Safety/medical concerns         Walk 10 feet on uneven surface  activity   Assist Walk 10 feet on uneven surfaces activity did not occur: Safety/medical concerns         Wheelchair     Assist Is the patient using a wheelchair?: Yes Type of Wheelchair: Manual    Wheelchair assist level: Supervision/Verbal cueing Max wheelchair distance: 75    Wheelchair 50 feet with 2 turns activity    Assist        Assist Level: Supervision/Verbal  cueing   Wheelchair 150 feet activity     Assist      Assist Level: Moderate Assistance - Patient 50 - 74%   Blood pressure 123/73, pulse 84, temperature 97.9 F (36.6 C), temperature source Oral, resp. rate 16,  height 6' 2 (1.88 m), weight 71.2 kg, SpO2 96%.  Medical Problem List and Plan: 1. Functional deficits secondary to debility secondary to MRSA bacteremia d/t pneumonia             -patient may shower             -ELOS/Goals: 14-16 days, SPV PT/OT              -Continue CIR - Patient has BKA prosthetic device, continue prosthetic training -PT seen by SLP- did well overall-- cognition improving - Expected discharge 10/17    2.  Antithrombotics: -DVT/anticoagulation:  Pharmaceutical: Eliquis  5mg  BID             -antiplatelet therapy: N/A 3. Pain Management: Oxycodone  prn for severe pain. Tylenol  prn for  mild pain.  - 10/10 infrequent use of oxycodone .  Reviewed orthopedic note regarding right shoulder.  Patient seen by Dr. Addie with outpatient CT scan pending.  Follow-up with Dr. Addie outpatient after this is completed.  Shoulder surgery felt to be risky by Ortho due to comorbidities. 4. Mood/Behavior/Sleep: LCSW to follow for evaluation and support.              --trazodone  prn for insomnia. Melatonin 5mg  nightly             -antipsychotic agents: N/A 5. Neuropsych/cognition: This patient is capable of making decisions on his own behalf. 6. Skin/Wound Care:  Routine pressure relief measures.    7. Fluids/Electrolytes/Nutrition:  Monitor I/O. Monitor labs. Check CK qMon.   -GERD: protonix  40mg  BID  -02/28/24 CMP stable 8. MRSA bacteremia: On Daptomycin X 4 weeks with EOT 03/22/24 --Ck-119 on 09/29. Recheck on Monday.   --Recurrent leucocytosis 13.7-->9.4-->11.8 -02/28/24 WBC 11.0 today, monitor.  - 10/9 WBC down to 8.4 -10/10 patient asked about contact precautions, will need to continue due to MRSA 9. Indurated area left buttock/cellulitis?: Monitor for worsening.  Augmentin  added on 09/30 w/ antibiotic duration to continue pending improvement.  --Repeat imaging in case of worsening to rule out abscess formation.   -03/07/24 no induration/cellulitis, but buttocks skin reddish and blanchable, will apply gerhardt's. Monitor closely.  10. A fib: Monitor HR TID--on Eliquis . HR controlled on cardizem  180mg  daily.   11. HFmrEF: Monitor daily weights and for signs of overload.             --On Lipitor 20mg  daily, Eliquis --off Imdur  due to soft BP.    -02/28/24 added daily weights order  -02/29/24 wt down 10kg but highly doubt accuracy; monitor for trend -10/7 weight trending up again but weight still down from 10/3 when he was recorded to be 83.6 kg?  Do not see signs of fluid overload at this time continue to monitor trend -10/8 weight trending up however down from weight on 10/3.  Therapy may check weight in wheelchair to compare.  No signs of fluid overload noted -10/9 weight lower today at 70.8 kg, strongly suspect weights measurements have not been accurate given significant variation.  Does not appear to have signs of fluid overload  - Recheck weight for today pending, no signs of fluid overload  -03/07/24 wt better, euvolemic, monitor Filed Weights   03/03/24 0654 03/04/24 0500 03/07/24 0414  Weight: 75.8 kg 70.8 kg 71.2 kg    11. COPD: On chronic prednisone  20 mg (unable to tolerate taper) -continue Brovana  BID with duo nebs prn.  12. Hypokalemia: Being supplemented intermittently. Low normal currently. 10/4 3.5, monitor  -10/6 will order today  -  10/9 stable at 2.1 13. Hypomagnesemia: Improved with supplementation. Was 1.6-->2.0             -02/28/24 Mg 2.0 today, monitor.   Recheck tomorrow-- 2.1 on 10/9 14. Constipation: Last BM 09/30--on senna-->increase to 2 tabs daily and continue miralax  BID.             --MOM today. Increase as needed.  -02/29/24 LBM yesterday small, pt doesn't recall it, remain on current regimen for now and monitor  output.  -10/6 Sorbitol 60ml today -03/07/24 LBM yesterday, cont regimen   15.  Neuropathy: Followed by [facility].  --On Lyrica  75 mg TID --off duloxetine .  -- of note, ordered as 150mg  daily +75mg  nightly  - Patient was followed by neurology see #[id number]   16. Anemia: Continue iron  supplement.   - 10/9 slightly lower at 9.1 continue to monitor trend 17. H/o BPH: Foley remains in place-->work on bowel regimen today and remove foley to start voiding trial tomorrow. Continue Flomax  0.4mg  nightly  -02/28/24 foley out this morning, nursing to monitor PVRs  -02/29/24 good UOP and low PVRs, cont monitoring  - 10/6 remains continent PVR 59 and 0, continue to follow  -10/8 continent bladder scans do not appear to be elevated-DC routine scans   18.  Right shoulder rotator cuff tear.  Last seen outpatient by Dr. Addie at [facility]. - Has outpatient CT shoulder ordered; would consider getting while he is inpatient to help plan outpatient surgery             - No formal weightbearing precautions  19. Azotemia  -Increase BUN  -encourage oral fluids, appears to be off Lasix  currently  - Recheck pending today-- BUN 25 10/9, none since then, labs Monday.  20.  Possible orthostatic hypotension - 10/7 will monitor for symptoms with therapy, baseline BP appears stable.  Abdominal binder if needed.  Encourage fluids -10/8 continue to monitor BP with therapy, encourage fluids, LE compression/abd binder if needed  - 10/10-12 denies recent symptoms continue to monitor   Vitals:   03/03/24 1437 03/03/24 2004 03/04/24 0557 03/04/24 1426  BP: 121/62 119/64 110/67 (!) 109/58   03/04/24 2022 03/05/24 0339 03/05/24 1302 03/05/24 1924  BP: 108/62 (!) 139/58 99/71 103/64   03/06/24 0454 03/06/24 1437 03/06/24 2018 03/07/24 0409  BP: 136/64 122/68 122/74 123/73     21.  Memory changes.  Appears more subacute versus chronic.  He has been following with neurology.  Reviewed recent notes patients wife indicated he  had stable MRI however neurology note 7/30 indicates MRI was declined.  Neurology was considering referral to neuromuscular specialist for his neuropathy. - Continue follow-up with outpatient neurology, consideration of neuropsych testing -03/07/24 seems much more at his baseline compared to last stay     LOS: 9 days A FACE TO [facility] EVALUATION WAS PERFORMED  [address] 03/07/2024, 10:56 AM

## 2024-03-07 NOTE — Progress Notes (Signed)
 Physical Therapy Session Note  Patient Details  Name: Levi [name] MRN: [id number] Date of Birth: [birth year]  Today's Date: 03/07/2024 PT Individual Time: 1346-1430 PT Individual Time Calculation (min): 44 min   Short Term Goals: Week 1:  PT Short Term Goal 1 (Week 1): pt will perform sit to stand with LRAD and mod A PT Short Term Goal 2 (Week 1): PT will contact [employer] for R LE prosthetic delivery and fit to pt PT Short Term Goal 3 (Week 1): pt will perform bed to chair transfer with min A consistently  Skilled Therapeutic Interventions/Progress Updates:      Pt seated in WC upon arrival. Pt agreeable to therapy. Pt endorses 7/10 R shoulder pain and R residual limb pain. PT provided rest breaks and repositioning and assessed R LE residual limb-no evidence of skin breakdown at location of pain.   WC propulsion room to ortho gym with B UE and L LE and supervision.   Pt positioned WC next to mat table for squat pivot transfer and min A. Pt doffed leg rests with increased time and supervision, veral cues provided for technique/sequencing.   Pt doffed arm rest with total A.  Treatment session focused on squat pivot transfer:   Pt overall demonstrates improved recall and implementation of head hip ratio with instructional progressing to questioning cues.    Pt performed lateral scooting along edge of Mat B with supervision, pt demonstrates improved buttock clearance.    Pt demonstrates limited carryover of buttock clearance with squat pivot transfer: pt able to perform lateral scoot without slide board with supervision to R on downhil and level surface. Pt requires heavy mod-max A to L for level surfaces. Max vebral cues provided for sequencing/technique.   Pt performed sit to stand from [facility] with RW and light-heavy mod A. Pt ambulated 21 feet with RW and CGA with +2 for WC follow, vebrla cues provided for upright posture and sequencing-pt heavily dependent on B UE support on RW.   Pt  supine in bed at end of session with all needs within reach and bed alarm on.         Therapy Documentation Precautions:  Precautions Precautions: Fall Recall of Precautions/Restrictions: Intact Precaution/Restrictions Comments: R BKA Restrictions Weight Bearing Restrictions Per Provider Order: No Other Position/Activity Restrictions: chronic R BKA, does not have prosthetic yet but was supposed to pick up from [employer] this week, MRSA   Therapy/Group: Individual Therapy  [facility] Doreene Orris, , DPT  03/07/2024, 7:49 AM

## 2024-03-08 ENCOUNTER — Ambulatory Visit: Admitting: Student in an Organized Health Care Education/Training Program

## 2024-03-08 LAB — CK: Total CK: 64 U/L (ref 49–397)

## 2024-03-08 LAB — COMPREHENSIVE METABOLIC PANEL WITH GFR
ALT: 16 U/L (ref 0–44)
AST: 17 U/L (ref 15–41)
Albumin: 2.2 g/dL — ABNORMAL LOW (ref 3.5–5.0)
Alkaline Phosphatase: 72 U/L (ref 38–126)
Anion gap: 8 (ref 5–15)
BUN: 27 mg/dL — ABNORMAL HIGH (ref 8–23)
CO2: 28 mmol/L (ref 22–32)
Calcium: 8.4 mg/dL — ABNORMAL LOW (ref 8.9–10.3)
Chloride: 106 mmol/L (ref 98–111)
Creatinine, Ser: 0.77 mg/dL (ref 0.61–1.24)
GFR, Estimated: >60 mL/min (ref >60)
Glucose, Bld: 95 mg/dL (ref 70–99)
Potassium: 3.1 mmol/L — ABNORMAL LOW (ref 3.5–5.1)
Sodium: 142 mmol/L (ref 135–145)
Total Bilirubin: 0.5 mg/dL (ref 0.0–1.2)
Total Protein: 4.7 g/dL — ABNORMAL LOW (ref 6.5–8.1)

## 2024-03-08 LAB — CBC
HCT: 29.8 % — ABNORMAL LOW (ref 39.0–52.0)
Hemoglobin: 9.1 g/dL — ABNORMAL LOW (ref 13.0–17.0)
MCH: 27.7 pg (ref 26.0–34.0)
MCHC: 30.5 g/dL (ref 30.0–36.0)
MCV: 90.9 fL (ref 80.0–100.0)
Platelets: 205 K/uL (ref 150–400)
RBC: 3.28 MIL/uL — ABNORMAL LOW (ref 4.22–5.81)
RDW: 17.3 % — ABNORMAL HIGH (ref 11.5–15.5)
WBC: 8.2 K/uL (ref 4.0–10.5)
nRBC: 0.2 % (ref 0.0–0.2)

## 2024-03-08 MED ORDER — SULFAMETHOXAZOLE-TRIMETHOPRIM 400-80 MG PO TABS
1.0000 | ORAL_TABLET | Freq: Every day | ORAL | Status: DC
  Administered 2024-03-08 – 2024-03-12 (×5): 1 via ORAL
  Filled 2024-03-08 (×5): qty 1

## 2024-03-08 MED ORDER — POTASSIUM CHLORIDE CRYS ER 20 MEQ PO TBCR
30.0000 meq | EXTENDED_RELEASE_TABLET | Freq: Two times a day (BID) | ORAL | Status: AC
  Administered 2024-03-08 (×2): 30 meq via ORAL
  Filled 2024-03-08 (×2): qty 1

## 2024-03-08 NOTE — Plan of Care (Signed)
  Problem: Consults Goal: RH GENERAL PATIENT EDUCATION Description: See Patient Education module for education specifics. Outcome: Progressing   Problem: RH BOWEL ELIMINATION Goal: RH STG MANAGE BOWEL WITH ASSISTANCE Description: STG Manage Bowel with mod I  Assistance. Outcome: Progressing Goal: RH STG MANAGE BOWEL W/MEDICATION W/ASSISTANCE Description: STG Manage Bowel with Medication with mod I  Assistance. Outcome: Progressing   Problem: RH BLADDER ELIMINATION Goal: RH STG MANAGE BLADDER WITH ASSISTANCE Description: STG Manage Bladder With toileting Assistance Outcome: Progressing Goal: RH STG MANAGE BLADDER WITH MEDICATION WITH ASSISTANCE Description: STG Manage Bladder With Medication With mod I Assistance. Outcome: Progressing   Problem: RH SKIN INTEGRITY Goal: RH STG SKIN FREE OF INFECTION/BREAKDOWN Description: Patient and wife will be able to manage skin with min assist Outcome: Progressing Goal: RH STG MAINTAIN SKIN INTEGRITY WITH ASSISTANCE Description: STG Maintain Skin Integrity With min Assistance. Outcome: Progressing Goal: RH STG ABLE TO PERFORM INCISION/WOUND CARE W/ASSISTANCE Description: STG Able To Perform Incision/Wound Care With Assistance. Outcome: Progressing   Problem: RH SAFETY Goal: RH STG ADHERE TO SAFETY PRECAUTIONS W/ASSISTANCE/DEVICE Description: STG Adhere to Safety Precautions With cues Assistance/Device. Outcome: Progressing   Problem: RH PAIN MANAGEMENT Goal: RH STG PAIN MANAGED AT OR BELOW PT'S PAIN GOAL Description: Pain < 4 with prns Outcome: Progressing   Problem: RH KNOWLEDGE DEFICIT GENERAL Goal: RH STG INCREASE KNOWLEDGE OF SELF CARE AFTER HOSPITALIZATION Description: Patient and wife will be able to manage care at discharge using educational resources independently Outcome: Progressing

## 2024-03-08 NOTE — Progress Notes (Signed)
 Physical Therapy Session Note  Patient Details  Name: Levi [name] MRN: [id number] Date of Birth: Nov 28, 1945  Today's Date: 03/08/2024 PT Individual Time: 0800-0900, [id number] PT Individual Time Calculation (min): 60 min, 71 min   Short Term Goals: Week 1:  PT Short Term Goal 1 (Week 1): pt will perform sit to stand with LRAD and mod A PT Short Term Goal 2 (Week 1): PT will contact [employer] for R LE prosthetic delivery and fit to pt PT Short Term Goal 3 (Week 1): pt will perform bed to chair transfer with min A consistently  Skilled Therapeutic Interventions/Progress Updates:      Session 1:  Pt semi-reclined in bed upon arrival. Patient reports his usual R shoulder pain but agreeable to therapy. Rest breaks and repositioning provided.  Session emphasized functional strength and endurance with transfers and improving standing tolerance with R prosthesis.  PT assisted patient with changing pants and soiled brief with total A while in bed. Pt able to roll L/R using bed rails to assist with this task. Pt sat to EOB using bed rails with HOB elevated with supervision and able to maintain sitting balance with good trunk control while donning prosthesis. Pt required assist initially to place liner distally but able complete the task without physical assist.  Pt performed squat pivot transfer EOB to WC with min A a verbal cues for head hip ratio.  Pt transported dependent in [facility] to main gym for time/energy conservation. Worked on the following standing activities in parallel bars to improve tolerance to standing position through LEs: R/L weight shifting with B UE support and min A; alternating lifting hands off rails progressing to alternating tapping PT shoulders with UEs with min A; taking both hands off rails x 3 trials with mod A and verbal cueing for hip/knee extension. Pt then took 1x1, 1x2, and 1x3 steps forward/backward in parallel bars with B UE support with min/mod A (with fatigue).  Frequent seated rest breaks required during these activities due to fatigue. Overall min/mod A required for sit to stands and min A required for stand to sit transfers. Pt denied any dizziness during standing activities.   Pt transported back to room dependent in [facility] for time/energy conservation. Pt comfortable and all needs within reach upon PT exiting room.   Session 2:  Pt supine in bed upon arrival without prosthesis donned. Patient reports his usual R shoulder pain but agreeable to therapy. Rest breaks and repositioning provided. [name] present during treatment.   Pt sat to EOB using bed rails with HOB elevated with supervision and able to maintain sitting balance with good trunk control while donning prosthesis. Pt required assist initially to place liner distally but able complete the task without physical assist. More time required to complete this task this afternoon compared to this morning, likely due to increased fatigue.  Pt performed squat pivot transfers EOB <> WC x2 with min/mod A (with fatigue). In the [facility], PT removed prothesis and sleeve to assess redness on tibial tuberosity. Removed square foam bandage to observe redness on R tibial tuberosity. Less indentation from bandage noted this afternoon compared to picture from earlier, however, indentations remains. Replaced bandage with thinner foam bandage. PT utilized clinical judgement and opted to hold use of prosthesis for remainder of session. PT also observed small red spot in area of R lateral calf but does not appear to be open. Pt also reported some sensitivity to touch in this area. Worked on seated LE strengthening exercises  and transfers without prosthesis for remainder of session. PT notified nurse of this issue and reached out for [facility] from [place] regarding this issue for additional input regarding this issue as well.  Patient performed the following seated LE strengthening exercises 3x10 each: L LAQ with red TB, R LAQ no  resistance, and B hip abduction with red TB. Provided patient with illustrated handout with these exercises for HEP.  Pt performed sliding board transfer back to EOB from [facility] with min A and verbal cueing for head hip ratio. Returned to supine with supervision. Pt required min A and extra time to reposition in bed using L UE on bed rail.  Pt comfortable, bed alarm on, all needs within reach, and pt's wife present upon PT exiting room.  Therapy Documentation Precautions:  Precautions Precautions: Fall Recall of Precautions/Restrictions: Intact Precaution/Restrictions Comments: R BKA Restrictions Weight Bearing Restrictions Per Provider Order: No Other Position/Activity Restrictions: chronic R BKA, does not have prosthetic yet but was supposed to pick up from [employer] this week, MRSA  Therapy/Group: Individual Therapy  [name], DPT 03/08/2024, 10:47 AM

## 2024-03-08 NOTE — Progress Notes (Signed)
 Occupational Therapy Session Note  Patient Details  Name: Levi [name] MRN: [id number] Date of Birth: Nov 18, 1945  Today's Date: 03/08/2024 OT Individual Time: 1002-1059 OT Individual Time Calculation (min): 57 min    Short Term Goals: Week 1:  OT Short Term Goal 1 (Week 1): The pt will safely complete LB bathing/dressing with ModI incorporating AE as needed. OT Short Term Goal 2 (Week 1): The pt will safely complete all functional t/f 's using a sliding board at [employer] OT Short Term Goal 3 (Week 1): The pt will safely participate in > 30 minutes of Occupational Therapy activity with minimal rest breaks. OT Short Term Goal 4 (Week 1): The pt will improve core strength by 1 grade to improve safety and Ind for functional task completion.  Skilled Therapeutic Interventions/Progress Updates: Patient seen for functional transfer training and self care tasks. Patient received taking morning medicine. Assisted patient to the bathroom ambulating with prosthetic Min assist with Mod assist to stand from w/c and from toilet. Patient able to perform pericare but needs assist with clothing management. Patient performed grooming sitting at the sink. Wife expressed concern with patients assist level for sit to stand- expressing concern if she would be able to provide Mod assist when patient is fatigued. Discussed furniture height and areas of concern in the home with the plan to work on the areas over the next several treatments. Patient propelled self to gym and transferred to hi-low mat with Mod assist and RW to work on sit to stand from elevated surface. Patient with good sit to stand from elevated surface standing with only close SBA/CGA, and working on reducing mat height with continued forward weight shift to allow for CGA sit to stand. Patient with good participation but needed physical cues to improve posture when transitioning from sit to stand. Patient propelled back to his room and assisted with transfer  back to bed; squat pivot Min/Mod assist. Continue with skilled OT POC.      Therapy Documentation Precautions:  Precautions Precautions: Fall Recall of Precautions/Restrictions: Intact Precaution/Restrictions Comments: R BKA Restrictions Weight Bearing Restrictions Per Provider Order: No Other Position/Activity Restrictions: chronic R BKA, does not have prosthetic yet but was supposed to pick up from [employer] this week, MRSA General:   Vital Signs: Therapy Vitals BP: 123/80 Pain:8/10 R shoulder   ADL: ADL Grooming: Setup Where Assessed-Grooming: sitting at sink Toileting: Minimal assistance,  Where Assessed-Toileting: BSC over toilet Toilet Transfer: Min assistance Toilet Transfer Method: ambulating Balance   Exercises:   Other Treatments:     Therapy/Group: Individual Therapy  [name] JONETTA Freund 03/08/2024, 12:23 PM

## 2024-03-08 NOTE — Progress Notes (Addendum)
 PROGRESS NOTE   Subjective/Complaints:  Patient reports he feels fairly well overall.  Asked about starting tamsulosin  because this was a prior medication, appears he is already getting this.  He denies any difficulty emptying his bladder or any other GU complaints.  ROS: as per HPI. Denies CP, SOB, abd pain, N/V/D/constipation, or any other complaints at this time.   + constipation-LBM yesterday but small, had a large bowel movement today before + Right shoulder pain chronic- unchanged + Memory changes over several months-no recent changes  Objective:   No results found.  Recent Labs    03/08/24 0438  WBC 8.2  HGB 9.1*  HCT 29.8*  PLT 205    Recent Labs    03/08/24 0438  NA 142  K 3.1*  CL 106  CO2 28  GLUCOSE 95  BUN 27*  CREATININE 0.77  CALCIUM  8.4*         Intake/Output Summary (Last 24 hours) at 03/08/2024 1234 Last data filed at 03/08/2024 0800 Gross per 24 hour  Intake 690 ml  Output 925 ml  Net -235 ml     Wound 02/24/24 0442 Pressure Injury Buttocks Left Stage 2 -  Partial thickness loss of dermis presenting as a shallow open injury with a red, pink wound bed without slough. (Active)    Physical Exam: Vital Signs Blood pressure 123/80, pulse 72, temperature 98.1 F (36.7 C), temperature source Oral, resp. rate 17, height 6' 2 (1.88 m), weight 73 kg, SpO2 99%.  Physical Exam Vitals and nursing note reviewed.  Constitutional: No apparent distress.  Sitting in wheelchair appears comfortable HENT: Atraumatic, normocephalic Eyes: PERRLA Cardiovascular: RRR, no murmurs/rub/gallops.  Respiratory: CTAB. No rales, rhonchi, or wheezing. On RA.  Abdomen: + bowel sounds, normoactive. No distention or tenderness.   Psych: Pleasant cooperative Ext: R BKA Neuro: A&O x 3, follows commands, fluent speech, cranial nerves II through XII grossly intact, MSK: pain with right shoulder passive ROM Skin:  buttocks a bit reddish but blanchable  PRIOR EXAMS: Skin: Papery skin with diffuse ecchymotic and flaking BUE. RUE with evidence of third spacing and dependent edema around elbow.  LLE with multiple scabs covered with foam dressing.  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  Pressure area with scab on tibial prominence.     MSK:      + R BKA       + L second toe amputation       + Right shoulder range of motion 0 degrees in flexion, abduction; right shoulder visibly protruding forward from the socket.  No crepitus with passive range of motion.       Neurologic exam:  Cognition: AAO to person, place, time and event.  Language: Fluent, No substitutions or neoglisms. No dysarthria. Names 3/3 objects correctly.  Memory: Recalls 3/3 objects at 5 minutes. No apparent deficits  Insight: Good  insight into current condition.  Mood: Pleasant affect, appropriate mood.  Sensation: To light reduced in left foot and bilateral fingertips in stocking glove pattern.  + Difficulty with proprioception in right hand digits 3 through 5   Reflexes: 2+ in BL UE and LEs. Negative Hoffman's and babinski signs bilaterally.  CN: 2-12 grossly intact.  Coordination: No apparent tremors. No ataxia  Spasticity: MAS 0 in all extremities.       Strength:                RUE: 0/5 SA, 2/5 EF, 3/5 EE, 4/5 WE, 4/5 FF, 4/5 FA                LUE:  4/5 SA, 4/5 EF, 4/5 EE, 4/5 WE, 4/5 FF, 4/5 FA                RLE: 4/5 HF, 5/5 KE                 LLE:  4/5 HF, 5/5 KE, 3/5  DF, 3/5  EHL, 2/5  PF  Assessment/Plan: 1. Functional deficits which require 3+ hours per day of interdisciplinary therapy in a comprehensive inpatient rehab setting. Physiatrist is providing close team supervision and 24 hour management of active medical problems listed below. Physiatrist and rehab team continue to assess barriers to discharge/monitor patient progress toward functional and medical goals  Care Tool:  Bathing     Body parts bathed by patient: Right arm, Left arm, Chest, Abdomen, Front perineal area, Right upper leg, Left upper leg, Face   Body parts bathed by helper: Buttocks, Right lower leg, Left lower leg     Bathing assist Assist Level: Moderate Assistance - Patient 50 - 74%     Upper Body Dressing/Undressing Upper body dressing   What is the patient wearing?: Pull over shirt    Upper body assist Assist Level: Set up assist    Lower Body Dressing/Undressing Lower body dressing      What is the patient wearing?: Pants     Lower body assist Assist for lower body dressing: Maximal Assistance - Patient 25 - 49%     Toileting Toileting    Toileting assist Assist for toileting: Moderate Assistance - Patient 50 - 74%     Transfers Chair/bed transfer  Transfers assist     Chair/bed transfer assist level: Minimal Assistance - Patient > 75%     Locomotion Ambulation   Ambulation assist   Ambulation activity did not occur: Safety/medical concerns  Assist level: Minimal Assistance - Patient > 75% Assistive device: Walker-rolling Max distance: 16 feet   Walk 10 feet activity   Assist  Walk 10 feet activity did not occur: Safety/medical concerns  Assist level: Minimal Assistance - Patient > 75%     Walk 50 feet activity   Assist Walk 50 feet with 2 turns activity did not occur: Safety/medical concerns         Walk 150 feet activity   Assist Walk 150 feet activity did not occur: Safety/medical concerns         Walk 10 feet on uneven surface  activity   Assist Walk 10 feet on uneven surfaces activity did not occur: Safety/medical concerns         Wheelchair     Assist Is the patient using a wheelchair?: Yes Type of Wheelchair: Manual    Wheelchair assist level: Supervision/Verbal cueing Max wheelchair distance: 75    Wheelchair 50 feet with 2 turns activity    Assist        Assist Level: Supervision/Verbal cueing    Wheelchair 150 feet activity     Assist      Assist Level: Moderate Assistance - Patient 50 - 74%   Blood pressure 123/80, pulse 72, temperature 98.1 F (36.7  C), temperature source Oral, resp. rate 17, height 6' 2 (1.88 m), weight 73 kg, SpO2 99%.  Medical Problem List and Plan: 1. Functional deficits secondary to debility secondary to MRSA bacteremia d/t pneumonia             -patient may shower             -ELOS/Goals: 14-16 days, [facility] PT/OT              -Continue CIR - Patient has BKA prosthetic device, continue prosthetic training -PT seen by SLP- did well overall-- cognition improving - Expected discharge 10/17    2.  Antithrombotics: -DVT/anticoagulation:  Pharmaceutical: Eliquis  5mg  BID             -antiplatelet therapy: N/A 3. Pain Management: Oxycodone  prn for severe pain. Tylenol  prn for  mild pain.  - 10/10 infrequent use of oxycodone .  Reviewed orthopedic note regarding right shoulder.  Patient seen by Dr. Addie with outpatient CT scan pending.  Follow-up with Dr. Addie outpatient after this is completed.  Shoulder surgery felt to be risky by Ortho due to comorbidities. 4. Mood/Behavior/Sleep: LCSW to follow for evaluation and support.              --trazodone  prn for insomnia. Melatonin 5mg  nightly             -antipsychotic agents: N/A 5. Neuropsych/cognition: This patient is capable of making decisions on his own behalf. 6. Skin/Wound Care:  Routine pressure relief measures.    7. Fluids/Electrolytes/Nutrition:  Monitor I/O. Monitor labs. Check CK qMon.   -GERD: protonix  40mg  BID 8. MRSA bacteremia: On Daptomycin X 4 weeks with EOT 03/22/24 --Ck-119 on 09/29. Recheck on Monday.   --Recurrent leucocytosis 13.7-->9.4-->11.8 -02/28/24 WBC 11.0 today, monitor.  - 10/9 WBC down to 8.4 -10/10 patient asked about contact precautions, will need to continue due to MRSA 9. Indurated area left buttock/cellulitis?: Monitor for worsening. Augmentin  added on 09/30 w/  antibiotic duration to continue pending improvement.  --Repeat imaging in case of worsening to rule out abscess formation.   -03/07/24 no induration/cellulitis, but buttocks skin reddish and blanchable, will apply gerhardt's. Monitor closely.  -10/14 completed course of Augmentin , will discontinue 10. A fib: Monitor HR TID--on Eliquis . HR controlled on cardizem  180mg  daily.   11. HFmrEF: Monitor daily weights and for signs of overload.             --On Lipitor 20mg  daily, Eliquis --off Imdur  due to soft BP.    -02/28/24 added daily weights order  -02/29/24 wt down 10kg but highly doubt accuracy; monitor for trend -10/7 weight trending up again but weight still down from 10/3 when he was recorded to be 83.6 kg?  Do not see signs of fluid overload at this time continue to monitor trend -10/8 weight trending up however down from weight on 10/3.  Therapy may check weight in wheelchair to compare.  No signs of fluid overload noted -10/9 weight lower today at 70.8 kg, strongly suspect weights measurements have not been accurate given significant variation.  Does not appear to have signs of fluid overload  - 10/13 appears euvolemic, continue to monitor Filed Weights   03/04/24 0500 03/07/24 0414 03/08/24 0500  Weight: 70.8 kg 71.2 kg 73 kg    11. COPD: On chronic prednisone  20 mg (unable to tolerate taper) -continue Brovana  BID with duo nebs prn.  - 10/13 discussed with pharmacy, start Septra  for PJP prophylaxis 12. Hypokalemia: Being supplemented intermittently. Low normal currently.  10/4 3.5, monitor  -10/6 will order today  -10/13 potassium low at 3.1, 2 doses of 30 mEq potassium ordered 13. Hypomagnesemia: Improved with supplementation. Was 1.6-->2.0             -02/28/24 Mg 2.0 today, monitor.   -2.1 on 10/9 14. Constipation: Last BM 09/30--on senna-->increase to 2 tabs daily and continue miralax  BID.             --MOM today. Increase as needed.  -02/29/24 LBM yesterday small, pt doesn't  recall it, remain on current regimen for now and monitor output.  -10/6 Sorbitol 60ml today -03/07/24 LBM yesterday, cont regimen -10/13 small BM yesterday continue to monitor   15.  Neuropathy: Followed by [facility].  --On Lyrica  75 mg TID --off duloxetine .  -- of note, ordered as 150mg  daily +75mg  nightly  - Patient was followed by neurology see #[id number]   16. Anemia: Continue iron  supplement.   - 10/9 slightly lower at 9.1 continue to monitor trend 17. H/o BPH: Foley remains in place-->work on bowel regimen today and remove foley to start voiding trial tomorrow. Continue Flomax  0.4mg  nightly  -02/28/24 foley out this morning, nursing to monitor PVRs  -02/29/24 good UOP and low PVRs, cont monitoring  - 10/6 remains continent PVR 59 and 0, continue to follow  -10/8 continent bladder scans do not appear to be elevated-DC routine scans  - Continue Flomax  patient denies any dysuria or other symptoms   18.  Right shoulder rotator cuff tear.  Last seen outpatient [facility]. - Has outpatient CT shoulder ordered; would consider getting while he is inpatient to help plan outpatient surgery             - No formal weightbearing precautions  19. Azotemia  -Increase BUN  -encourage oral fluids, appears to be off Lasix  currently  - Recheck pending today-- BUN 25 10/9, none since then, labs Monday.  20.  Possible orthostatic hypotension - 10/7 will monitor for symptoms with therapy, baseline BP appears stable.  Abdominal binder if needed.  Encourage fluids -10/8 continue to monitor BP with therapy, encourage fluids, LE compression/abd binder if needed  - 10/10-13 denies recent symptoms continue to monitor   Vitals:   03/04/24 2022 03/05/24 0339 03/05/24 1302 03/05/24 1924  BP: 108/62 (!) 139/58 99/71 103/64   03/06/24 0454 03/06/24 1437 03/06/24 2018 03/07/24 0409  BP: 136/64 122/68 122/74 123/73   03/07/24 1446 03/07/24 2145 03/08/24 0553 03/08/24 1020  BP: 104/73 (!) 145/68  (!) 144/71 123/80     21.  Memory changes.  Appears more subacute versus chronic.  He has been following with neurology.  Reviewed recent notes patients wife indicated he had stable MRI however neurology note 7/30 indicates MRI was declined.  Neurology was considering referral to neuromuscular specialist for his neuropathy. - Continue follow-up with outpatient neurology, consideration of neuropsych testing -03/07/24 seems much more at his baseline compared to last stay     LOS: 10 days A FACE TO FACE EVALUATION WAS PERFORMED  Murray Collier 03/08/2024, 12:34 PM

## 2024-03-08 NOTE — Anesthesia Postprocedure Evaluation (Signed)
 Anesthesia Post Note  Patient: Levi [name]  Procedure(s) Performed: TRANSESOPHAGEAL ECHOCARDIOGRAM     Anesthesia Type: MAC Anesthetic complications: no   There were no known notable events for this encounter.  Last Vitals:  Vitals:   02/28/24 1343 02/28/24 2037  BP: 101/60 (!) 148/77  Pulse: 95 (!) 47  Resp: 16 18  Temp: 36.5 C 36.6 C  SpO2: 93% 94%    Last Pain:  Vitals:   02/28/24 2037  TempSrc: Oral  PainSc:                  Sammye Staff E

## 2024-03-08 NOTE — Progress Notes (Signed)
 Patient ID: Levi [name], male   DOB: November 26, 1945, 78 y.o.   MRN: [id number]  Demographics sent to [facility] for wheelchair

## 2024-03-08 NOTE — Progress Notes (Signed)
 Pt has received 14d of Augmentin  for cellulitis. Ok to stopped per Dr. Urbano. Pt will also be on moderate dose of chronic prednisone , ok to add septra  SS 1 tablet daily for PJP prophylaxis.   Sergio Batch, PharmD, BCIDP, AAHIVP, CPP Infectious Disease Pharmacist 03/08/2024 9:47 AM

## 2024-03-08 NOTE — Progress Notes (Signed)
 Nutrition Follow Up  DOCUMENTATION CODES:  Non-severe (moderate) malnutrition in context of chronic illness  INTERVENTION:  Multivitamin w/ minerals daily  Continue Juven po BID, each packet provides 95 calories, 2.5 grams of protein (collagen), and 9.8 grams of carbohydrate (3 grams sugar); also contains 7 grams of L-arginine and L-glutamine, 300 mg vitamin C, 15 mg vitamin E, 1.2 mcg vitamin B-12, 9.5 mg zinc , 200 mg calcium , and 1.5 g  Calcium  Beta-hydroxy-Beta-methylbutyrate to support wound healing  Continue 30 ml ProSource Plus BID, each supplement provides 100 kcals and 15 grams protein.   Encourage good PO intake once discharged to continue to heal wounds and help maintain his strength and energy levels  Ok for family to bring outside food if will help with PO intake  NUTRITION DIAGNOSIS:  Moderate Malnutrition related to chronic illness as evidenced by mild fat depletion, moderate muscle depletion. Remains applicable  GOAL:  Patient will meet greater than or equal to 90% of their needs Progressing  MONITOR:  PO intake, Supplement acceptance, Weight trends  REASON FOR ASSESSMENT:  Consult Assessment of nutrition requirement/status, Diet education, Wound healing  ASSESSMENT:  78 y.o. male admitted to rehab after admitted to acute care for sepsis from pneumonia. PMH includes GERD, HTN, COPD, BPH, HLD, and R foot osteomyelitis s/p R BKA.   10/3 - Admitted   Spoke with pt and pt's wife who was at bedside. Pt reports continued good appetite, diet summary shows an average meal completion of 94% over last 8 meals. Pt endorses continued intake of Juven and ProSource to aid in wound healing. Pt reports no GI discomforts at this time, but does report some taste changes. Pt reports having a metallic taste after eating blander foods but says it's not as bad when he eats foods with a stronger taste. Discussed how certain medications can cause taste changes and encouraged pt to continue  eating foods with strong flavor to improve taste.  Pt making good progress with therapies and is set for discharge later this week. Encouraged continued adequate intake once home to help pt maintain progress he has made in rehab.   Average Meal Completion: 10/4-10/7: 67% average intake x 5 recorded meals 10/9-10/12: 94% average intake x 8 recorded meals  Admission Weight: 83.6 kg Current Weight: 73 kg *suspect wt changes related to lasix  treatment which has just been discontinued in combination with possible inaccurate admission wt* Weight Hx: 10/3 83.6 kg 10/5 73.5 kg 10/7 75.1 kg 10/9 70.9 kg 10/12 71.2 kg 10/13 73 kg   Nutrition Related Medications: Prosource Plus BID Fergon MVI w/ minerals  Juven BID Protonix   Miralax  Senna Daptomycin  Labs:  Potassium 3.1<--3.8 BUN 27   Diet Order:   Diet Order             Diet regular Fluid consistency: Thin  Diet effective now                  EDUCATION NEEDS:  Education needs have been addressed  Skin:  Skin Assessment: Reviewed RN Assessment - Stage 2 Buttocks - Stage 1 sacrum  Last BM:  10/11 full bm, 10/12 smear  Height:  Ht Readings from Last 1 Encounters:  02/27/24 6' 2 (1.88 m)   Weight:  Wt Readings from Last 1 Encounters:  03/08/24 73 kg    BMI:  Body mass index is 20.67 kg/m.  Estimated Nutritional Needs:  Kcal:  2100-2300 Protein:  110-130 grams Fluid:  >/= 2 L   Josette Glance, MS,  RDN, LDN Clinical Dietitian I Please reach out via secure chat

## 2024-03-09 MED ORDER — SORBITOL 70 % SOLN: 30.0000 mL | Freq: Every day | Status: DC | PRN

## 2024-03-09 NOTE — Progress Notes (Signed)
 PROGRESS NOTE   Subjective/Complaints:  Working with therapy in the gym today.  Continues to have shoulder pain but this is chronic.  Patient was having an area of skin irritation on distal residual limb likely from prosthetic device.  [facility] has been contacted for adjustments.  ROS: as per HPI. Denies fevers, CP, SOB, abd pain, N/V/D/constipation, or any other complaints at this time.   + constipation-improved + Right shoulder pain chronic- unchanged + Memory changes over several months-no recent changes  Objective:   No results found.  Recent Labs    03/08/24 0438  WBC 8.2  HGB 9.1*  HCT 29.8*  PLT 205    Recent Labs    03/08/24 0438  NA 142  K 3.1*  CL 106  CO2 28  GLUCOSE 95  BUN 27*  CREATININE 0.77  CALCIUM  8.4*         Intake/Output Summary (Last 24 hours) at 03/09/2024 1335 Last data filed at 03/09/2024 0800 Gross per 24 hour  Intake 540 ml  Output 1300 ml  Net -760 ml         Physical Exam: Vital Signs Blood pressure 134/75, pulse 69, temperature 98.1 F (36.7 C), temperature source Oral, resp. rate 16, height 6' 2 (1.88 m), weight 72.1 kg, SpO2 99%.  Physical Exam Vitals and nursing note reviewed.  Constitutional: No apparent distress.  Sitting in wheelchair appears comfortable HENT: Atraumatic, normocephalic Eyes: PERRLA Cardiovascular: RRR, no murmurs/rub/gallops.  Respiratory: CTAB. No rales, rhonchi, or wheezing. On RA.  Abdomen: + bowel sounds, normoactive. No distention or tenderness.   Psych: Pleasant cooperative Ext: R BKA small skin irritation/abrasion on anterior residual limb Neuro: A&O x 3, follows commands, fluent speech, cranial nerves II through XII grossly intact, MSK: pain with right shoulder passive ROM Skin: buttocks a bit reddish but blanchable  PRIOR EXAMS: Skin: Papery skin with diffuse ecchymotic and flaking BUE. RUE with evidence of third spacing and  dependent edema around elbow.  LLE with multiple scabs covered with foam dressing.  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  Pressure area with scab on tibial prominence.     MSK:      + R BKA       + L second toe amputation       + Right shoulder range of motion 0 degrees in flexion, abduction; right shoulder visibly protruding forward from the socket.  No crepitus with passive range of motion.       Neurologic exam:  Cognition: AAO to person, place, time and event.  Language: Fluent, No substitutions or neoglisms. No dysarthria. Names 3/3 objects correctly.  Memory: Recalls 3/3 objects at 5 minutes. No apparent deficits  Insight: Good  insight into current condition.  Mood: Pleasant affect, appropriate mood.  Sensation: To light reduced in left foot and bilateral fingertips in stocking glove pattern.  + Difficulty with proprioception in right hand digits 3 through 5   Reflexes: 2+ in BL UE and LEs. Negative Hoffman's and babinski signs bilaterally.  CN: 2-12 grossly intact.  Coordination: No apparent tremors. No ataxia  Spasticity: MAS 0 in all extremities.       Strength:  RUE: 0/5 SA, 2/5 EF, 3/5 EE, 4/5 WE, 4/5 FF, 4/5 FA                LUE:  4/5 SA, 4/5 EF, 4/5 EE, 4/5 WE, 4/5 FF, 4/5 FA                RLE: 4/5 HF, 5/5 KE                 LLE:  4/5 HF, 5/5 KE, 3/5  DF, 3/5  EHL, 2/5  PF  Assessment/Plan: 1. Functional deficits which require 3+ hours per day of interdisciplinary therapy in a comprehensive inpatient rehab setting. Physiatrist is providing close team supervision and 24 hour management of active medical problems listed below. Physiatrist and rehab team continue to assess barriers to discharge/monitor patient progress toward functional and medical goals  Care Tool:  Bathing    Body parts bathed by patient: Right arm, Left arm, Chest, Abdomen, Front perineal area, Right upper leg, Left upper leg, Face   Body parts bathed by helper:  Buttocks, Right lower leg, Left lower leg     Bathing assist Assist Level: Moderate Assistance - Patient 50 - 74%     Upper Body Dressing/Undressing Upper body dressing   What is the patient wearing?: Pull over shirt    Upper body assist Assist Level: Set up assist    Lower Body Dressing/Undressing Lower body dressing      What is the patient wearing?: Pants     Lower body assist Assist for lower body dressing: Maximal Assistance - Patient 25 - 49%     Toileting Toileting    Toileting assist Assist for toileting: Moderate Assistance - Patient 50 - 74%     Transfers Chair/bed transfer  Transfers assist     Chair/bed transfer assist level: Minimal Assistance - Patient > 75%     Locomotion Ambulation   Ambulation assist   Ambulation activity did not occur: Safety/medical concerns  Assist level: Minimal Assistance - Patient > 75% Assistive device: Walker-rolling Max distance: 16 feet   Walk 10 feet activity   Assist  Walk 10 feet activity did not occur: Safety/medical concerns  Assist level: Minimal Assistance - Patient > 75%     Walk 50 feet activity   Assist Walk 50 feet with 2 turns activity did not occur: Safety/medical concerns         Walk 150 feet activity   Assist Walk 150 feet activity did not occur: Safety/medical concerns         Walk 10 feet on uneven surface  activity   Assist Walk 10 feet on uneven surfaces activity did not occur: Safety/medical concerns         Wheelchair     Assist Is the patient using a wheelchair?: Yes Type of Wheelchair: Manual    Wheelchair assist level: Supervision/Verbal cueing Max wheelchair distance: 75    Wheelchair 50 feet with 2 turns activity    Assist        Assist Level: Supervision/Verbal cueing   Wheelchair 150 feet activity     Assist      Assist Level: Moderate Assistance - Patient 50 - 74%   Blood pressure 134/75, pulse 69, temperature 98.1 F (36.7  C), temperature source Oral, resp. rate 16, height 6' 2 (1.88 m), weight 72.1 kg, SpO2 99%.  Medical Problem List and Plan: 1. Functional deficits secondary to debility secondary to MRSA bacteremia d/t pneumonia             -  patient may shower             -ELOS/Goals: 14-16 days, SPV PT/OT              -Continue CIR - Patient has BKA prosthetic device, continue prosthetic training -PT seen by SLP- did well overall-- cognition improving - Expected discharge 10/17 - Team conference tomorrow    2.  Antithrombotics: -DVT/anticoagulation:  Pharmaceutical: Eliquis  5mg  BID             -antiplatelet therapy: N/A 3. Pain Management: Oxycodone  prn for severe pain. Tylenol  prn for  mild pain.  - 10/10 infrequent use of oxycodone .  Reviewed orthopedic note regarding right shoulder.  Patient seen by Dr. Addie with outpatient CT scan pending.  Follow-up with Dr. Addie outpatient after this is completed.  Shoulder surgery felt to be risky by Ortho due to comorbidities. 4. Mood/Behavior/Sleep: LCSW to follow for evaluation and support.              --trazodone  prn for insomnia. Melatonin 5mg  nightly             -antipsychotic agents: N/A 5. Neuropsych/cognition: This patient is capable of making decisions on his own behalf. 6. Skin/Wound Care:  Routine pressure relief measures.    7. Fluids/Electrolytes/Nutrition:  Monitor I/O. Monitor labs. Check CK qMon.   -GERD: protonix  40mg  BID 8. MRSA bacteremia: On Daptomycin X 4 weeks with EOT 03/22/24 --Ck-119 on 09/29. Recheck on Monday.   --Recurrent leucocytosis 13.7-->9.4-->11.8 -02/28/24 WBC 11.0 today, monitor.  - 10/9 WBC down to 8.4 -10/10 patient asked about contact precautions, will need to continue due to MRSA 9. Indurated area left buttock/cellulitis?: Monitor for worsening. Augmentin  added on 09/30 w/ antibiotic duration to continue pending improvement.  --Repeat imaging in case of worsening to rule out abscess formation.   -03/07/24 no  induration/cellulitis, but buttocks skin reddish and blanchable, will apply gerhardt's. Monitor closely.  -10/14 completed course of Augmentin , will discontinue 10. A fib: Monitor HR TID--on Eliquis . HR controlled on cardizem  180mg  daily.   11. HFmrEF: Monitor daily weights and for signs of overload.             --On Lipitor 20mg  daily, Eliquis --off Imdur  due to soft BP.    -02/28/24 added daily weights order  -02/29/24 wt down 10kg but highly doubt accuracy; monitor for trend -10/7 weight trending up again but weight still down from 10/3 when he was recorded to be 83.6 kg?  Do not see signs of fluid overload at this time continue to monitor trend -10/8 weight trending up however down from weight on 10/3.  Therapy may check weight in wheelchair to compare.  No signs of fluid overload noted -10/9 weight lower today at 70.8 kg, strongly suspect weights measurements have not been accurate given significant variation.  Does not appear to have signs of fluid overload  - 10/13-14 appears euvolemic, continue to monitor Filed Weights   03/07/24 0414 03/08/24 0500 03/09/24 0500  Weight: 71.2 kg 73 kg 72.1 kg    11. COPD: On chronic prednisone  20 mg (unable to tolerate taper) -continue Brovana  BID with duo nebs prn.  - 10/13 discussed with pharmacy, start Septra  for PJP prophylaxis 12. Hypokalemia: Being supplemented intermittently. Low normal currently. 10/4 3.5, monitor  -10/6 will order today  -10/13 potassium low at 3.1, 2 doses of 30 mEq potassium ordered 13. Hypomagnesemia: Improved with supplementation. Was 1.6-->2.0             -02/28/24 Mg  2.0 today, monitor.   -2.1 on 10/9 14. Constipation: Last BM 09/30--on senna-->increase to 2 tabs daily and continue miralax  BID.             --MOM today. Increase as needed.  -02/29/24 LBM yesterday small, pt doesn't recall it, remain on current regimen for now and monitor output.  -10/6 Sorbitol 60ml today -03/07/24 LBM yesterday, cont  regimen -10/13 small BM yesterday continue to monitor - 10/14 continue MiraLAX  and senna, prn sorbitol ordered   15.  Neuropathy: Followed by [facility].  --On Lyrica  75 mg TID --off duloxetine .  -- of note, ordered as 150mg  daily +75mg  nightly  - Patient was followed by neurology see #[id number]   16. Anemia: Continue iron  supplement.   - 10/9 slightly lower at 9.1 continue to monitor trend 17. H/o BPH: Foley remains in place-->work on bowel regimen today and remove foley to start voiding trial tomorrow. Continue Flomax  0.4mg  nightly  -02/28/24 foley out this morning, nursing to monitor PVRs  -02/29/24 good UOP and low PVRs, cont monitoring  - 10/6 remains continent PVR 59 and 0, continue to follow  -10/8 continent bladder scans do not appear to be elevated-DC routine scans  - Continue Flomax  patient denies any dysuria or other symptoms   18.  Right shoulder rotator cuff tear.  Last seen [facility]. - Has outpatient CT shoulder ordered; would consider getting while he is inpatient to help plan outpatient surgery             - No formal weightbearing precautions  19. Azotemia  -Increase BUN  -encourage oral fluids, appears to be off Lasix  currently  - Recheck pending today-- BUN 25 10/9, none since then, labs Monday.  - 10/13 BUN 27 creatinine 0.77-continue home Lasix  encourage oral fluid  -Recheck tomorrow 10/15 20.  Possible orthostatic hypotension - 10/7 will monitor for symptoms with therapy, baseline BP appears stable.  Abdominal binder if needed.  Encourage fluids -10/8 continue to monitor BP with therapy, encourage fluids, LE compression/abd binder if needed  - 10/10-14 denies recent symptoms continue to monitor   Vitals:   03/05/24 1924 03/06/24 0454 03/06/24 1437 03/06/24 2018  BP: 103/64 136/64 122/68 122/74   03/07/24 0409 03/07/24 1446 03/07/24 2145 03/08/24 0553  BP: 123/73 104/73 (!) 145/68 (!) 144/71   03/08/24 1020 03/08/24 1437 03/08/24 2017  03/09/24 0501  BP: 123/80 119/60 116/64 134/75     21.  Memory changes.  Appears more subacute versus chronic.  He has been following with neurology.  Reviewed recent notes patients wife indicated he had stable MRI however neurology note 7/30 indicates MRI was declined.  Neurology was considering referral to neuromuscular specialist for his neuropathy. - Continue follow-up with outpatient neurology, consideration of neuropsych testing -03/07/24 seems much more at his baseline compared to last stay     LOS: 11 days A FACE TO FACE EVALUATION WAS PERFORMED  Murray Collier 03/09/2024, 1:35 PM

## 2024-03-09 NOTE — Plan of Care (Signed)
  Problem: Consults Goal: RH GENERAL PATIENT EDUCATION Description: See Patient Education module for education specifics. Outcome: Progressing   Problem: RH BOWEL ELIMINATION Goal: RH STG MANAGE BOWEL WITH ASSISTANCE Description: STG Manage Bowel with mod I  Assistance. Outcome: Progressing Goal: RH STG MANAGE BOWEL W/MEDICATION W/ASSISTANCE Description: STG Manage Bowel with Medication with mod I  Assistance. Outcome: Progressing   Problem: RH BLADDER ELIMINATION Goal: RH STG MANAGE BLADDER WITH ASSISTANCE Description: STG Manage Bladder With toileting Assistance Outcome: Progressing Goal: RH STG MANAGE BLADDER WITH MEDICATION WITH ASSISTANCE Description: STG Manage Bladder With Medication With mod I Assistance. Outcome: Progressing   Problem: RH SKIN INTEGRITY Goal: RH STG SKIN FREE OF INFECTION/BREAKDOWN Description: Patient and wife will be able to manage skin with min assist Outcome: Progressing Goal: RH STG MAINTAIN SKIN INTEGRITY WITH ASSISTANCE Description: STG Maintain Skin Integrity With min Assistance. Outcome: Progressing Goal: RH STG ABLE TO PERFORM INCISION/WOUND CARE W/ASSISTANCE Description: STG Able To Perform Incision/Wound Care With Assistance. Outcome: Progressing   Problem: RH SAFETY Goal: RH STG ADHERE TO SAFETY PRECAUTIONS W/ASSISTANCE/DEVICE Description: STG Adhere to Safety Precautions With cues Assistance/Device. Outcome: Progressing   Problem: RH PAIN MANAGEMENT Goal: RH STG PAIN MANAGED AT OR BELOW PT'S PAIN GOAL Description: Pain < 4 with prns Outcome: Progressing   Problem: RH KNOWLEDGE DEFICIT GENERAL Goal: RH STG INCREASE KNOWLEDGE OF SELF CARE AFTER HOSPITALIZATION Description: Patient and wife will be able to manage care at discharge using educational resources independently Outcome: Progressing

## 2024-03-09 NOTE — Progress Notes (Signed)
 Occupational Therapy Session Note  Patient Details  Name: Levi [name] MRN: [id number] Date of Birth: 24-Apr-1946  Today's Date: 03/09/2024 OT Individual Time: 9052-8899 OT Individual Time Calculation (min): 73 min    Short Term Goals: Week 1:  OT Short Term Goal 1 (Week 1): The pt will safely complete LB bathing/dressing with ModI incorporating AE as needed. OT Short Term Goal 2 (Week 1): The pt will safely complete all functional t/f 's using a sliding board at [employer] OT Short Term Goal 3 (Week 1): The pt will safely participate in > 30 minutes of Occupational Therapy activity with minimal rest breaks. OT Short Term Goal 4 (Week 1): The pt will improve core strength by 1 grade to improve safety and Ind for functional task completion.  Skilled Therapeutic Interventions/Progress Updates:  Patient agreeable to participate in OT session. Reports 6/10 pain level in r shoulder which is chronic.   Patient participated in skilled OT session focusing on core strengthening, functional transfers via stand pivot, static standing balance. Patient received in room. Completed wc mobility to gym. Completed functional transfer to mat table via squat pivot with maxA. Completed sit to stand from wc total A. Completed sit to stand in parallel bars max A. Able to stand for 30 seconds. Completed seated crunches, and weighted rotations with 3 pound ball.  Patient completed nu step to increase functional use of UE, functional activity tolerance, and LB strengthening. Patient returned to room and handed off to PT for session. Therapist facilitated increased strength, mobility, activity tolerance and balance to increase ADL.    Therapy Documentation Precautions:  Precautions Precautions: Fall Recall of Precautions/Restrictions: Intact Precaution/Restrictions Comments: R BKA Restrictions Weight Bearing Restrictions Per Provider Order: No Other Position/Activity Restrictions: chronic R BKA, does not have prosthetic  yet but was supposed to pick up from [facility] this week, MRSA  Therapy/Group: Individual Therapy  D'mariea L Antoinne Spadaccini 03/09/2024, 7:43 AM

## 2024-03-09 NOTE — Progress Notes (Signed)
 Physical Therapy Weekly Progress Note  Patient Details  Name: Levi [name] MRN: [id number] Date of Birth: May 04, 1946  Beginning of progress report period: February 29, 2024 End of progress report period: March 08, 2024  Patient has met 3 of 3 short term goals. Pt is making great progress toward LTGs. Pt is completing bed mobility: supervision, sit to stand with min A from elevated surface, mod A from [facility] with use of prothetic; pt ability to wear prosthetic at the moment hindered due to skin lesion on tibial tuberosity; contacted [facility] from [employer] 10/13 and awiting response; sliding board transfers with supervision/min A. D/C scheduled 10/17. Working on progressing independence with transfers and hands on family training.  Patient continues to demonstrate the following deficits muscle weakness and muscle joint tightness and decreased sitting balance, decreased standing balance, and decreased postural control and therefore will continue to benefit from skilled PT intervention to increase functional independence with mobility.  Patient progressing toward long term goals..  Continue plan of care.  PT Short Term Goals Week 1:  PT Short Term Goal 1 (Week 1): pt will perform sit to stand with LRAD and mod A PT Short Term Goal 2 (Week 1): PT will contact [facility] for R LE prosthetic delivery and fit to pt PT Short Term Goal 3 (Week 1): pt will perform bed to chair transfer with min A consistently  Skilled Therapeutic Interventions/Progress Updates:      Therapy Documentation Precautions:  Precautions Precautions: Fall Recall of Precautions/Restrictions: Intact Precaution/Restrictions Comments: R BKA Restrictions Weight Bearing Restrictions Per Provider Order: No Other Position/Activity Restrictions: chronic R BKA, does not have prosthetic yet but was supposed to pick up from [facility] this week, MRSA  Therapy/Group: Individual Therapy  [facility] Doreene Orris, PT, DTaP [place], DPT  03/09/2024, 8:45 AM

## 2024-03-09 NOTE — Progress Notes (Signed)
 Physical Therapy Session Note  Patient Details  Name: Levi [name] MRN: [id number] Date of Birth: 08/04/1945  Today's Date: 03/09/2024 PT Individual Time: 1101-1130 PT Individual Time Calculation (min): 29 min   Short Term Goals: Week 2:  PT Short Term Goal 1 (Week 2): =LTG 2/2 to ELOS  Skilled Therapeutic Interventions/Progress Updates:      Pt received from OT. Pt agreeable to therapy. Pt denies any pain.   Pt family with questions regarding possibility of getting standard WC with increased floor to height WC for improved independence with transfers 2/2 pt knees higher than hips in his current WC. Met with Selinda, representative from [employer] to see if it is a possibility with pt insurance--given pt has prosthetic. Education provided pt unable to get [facility] and prosthetic covered by insurance. Selinda recommended higher profile cushion. PT answered pt and pt wife questions regarding qualifications for roho cushion--Pt does not qualify. Therapist provided handout for knock-off roho cushion pt could purchase on [employer]. PT also emphasized education for pressure relief--with emphasis on technique and frequency. Recommending lateral trunk lean in WC as pt unable to sustain WC push up, and pt unable to get adequate clearance with anterior weight shift. Pt returned demonstration and provided teach back of lateral trunk lean in WC and rolling in bed for adequate pressure relief.   Pt supine in bed with all needs within reach and bed alarm on.   Therapy Documentation Precautions:  Precautions Precautions: Fall Recall of Precautions/Restrictions: Intact Precaution/Restrictions Comments: R BKA Restrictions Weight Bearing Restrictions Per Provider Order: No Other Position/Activity Restrictions: chronic R BKA, does not have prosthetic yet but was supposed to pick up from [facility] this week, MRSA  Therapy/Group: Individual Therapy  [facility] Doreene Orris, [place], DPT  03/09/2024, 8:45 AM

## 2024-03-09 NOTE — Progress Notes (Signed)
 Physical Therapy Session Note  Patient Details  Name: Levi [name] MRN: [id number] Date of Birth: 1945-07-29  Today's Date: 03/09/2024 PT Individual Time: 0800-0900, 1430-1531 PT Individual Time Calculation (min): 60 min, 61 min   Short Term Goals: Week 1:  PT Short Term Goal 1 (Week 1): pt will perform sit to stand with LRAD and mod A PT Short Term Goal 2 (Week 1): PT will contact [employer] for R LE prosthetic delivery and fit to pt PT Short Term Goal 3 (Week 1): pt will perform bed to chair transfer with min A consistently  Skilled Therapeutic Interventions/Progress Updates:      Treatment 1:  Pt supine in bed upon arrival. Pt reports his usual R shoulder pain but agreeable to therapy. Rest breaks and repositioning provided. Nurse present at start of session to administer medications.  Red spot on R tibial tuberosity appears to be less red this AM compared to yesterday afternoon. Small red spot on lateral R calf also appears to be less red and patient reports less sensitivity to touch in this area this AM. No prosthesis use during this treatment session.  Pt performed L/R rolling in bed with use of bed rails with mod I while PT assisted pt with changing pants and soiled brief with total A. Pt sat to EOB from flat bed using bed rails with CGA and extra time. Pt performed sliding board transfer to [facility] with min A and verbal cueing for head hip ratio.  Remainder of session emphasized functional strengthening with WC mobility. Pt propelled his WC 1x ~110 ft, 1x ~100 ft, and 1x ~ 50 ft using B UE and L LE with supervision and verbal cueing, especially through doorways and with sharp turns to watch out for hands. Pt required rest breaks between trials due to R shoulder pain/fatigue.  Once back in room, pt performed squat/scoot pivot transfer WC <> BSC over toilet with max A. Pt able to weight shifting and chair push up with B UE in order for PT to perform clothing management. PT replaced pt's  brief as it was soiled.  Pt remained seated in WC, seltbelt alarm donned/on, and all need within reach upon PT exiting room.  Treatment 2:  Pt supine in bed upon arrival. Pt reports his usual R shoulder pain and also complains of a burning sensation in his lower back. Pt agreeable to therapy. Rest breaks and repositioning provided.  Redness noted in area of low back/buttock. PT applied barrier cream and notified nursing. Pt reported some relief in this area upon sitting to EOB.  Session emphasized functional strengthening and increasing independence with transfer training.  Pt performed multiple slide board transfers seated EOB (various heights) <> WC with min A progressing to CGA. Pt performed multiple squat pivot transfers seated EOB (various heights) <> WC with min/mod A. Pt required verbal cueing for head hip ratio and proper hand placement during all transfers.  [place], rep from [employer] unavailable. Pt opted to trial wearing prosthesis for short bouts throughout treatment versus wearing entire duration of session. Attempted to don patient's prosthesis several times throughout session but unsuccessful. Donned pt's shrinker at end of session and will retry donning prosthesis tomorrow.  Pt supine in bed, bed alarm on, and all need within reach upon PT exiting room.  Therapy Documentation Precautions:  Precautions Precautions: Fall Recall of Precautions/Restrictions: Intact Precaution/Restrictions Comments: R BKA Restrictions Weight Bearing Restrictions Per Provider Order: No Other Position/Activity Restrictions: chronic R BKA, does not have prosthetic yet  but was supposed to pick up from [facility] this week, MRSA  Therapy/Group: Individual Therapy  [name], DPT 03/09/2024, 8:07 AM

## 2024-03-10 LAB — BASIC METABOLIC PANEL WITH GFR
Anion gap: 9 (ref 5–15)
BUN: 33 mg/dL — ABNORMAL HIGH (ref 8–23)
CO2: 25 mmol/L (ref 22–32)
Calcium: 8.1 mg/dL — ABNORMAL LOW (ref 8.9–10.3)
Chloride: 107 mmol/L (ref 98–111)
Creatinine, Ser: 0.71 mg/dL (ref 0.61–1.24)
GFR, Estimated: >60 mL/min (ref >60)
Glucose, Bld: 118 mg/dL — ABNORMAL HIGH (ref 70–99)
Potassium: 3.5 mmol/L (ref 3.5–5.1)
Sodium: 141 mmol/L (ref 135–145)

## 2024-03-10 MED ORDER — POTASSIUM CHLORIDE CRYS ER 10 MEQ PO TBCR
10.0000 meq | EXTENDED_RELEASE_TABLET | Freq: Every day | ORAL | Status: DC
  Administered 2024-03-10 – 2024-03-12 (×3): 10 meq via ORAL
  Filled 2024-03-10 (×3): qty 1

## 2024-03-10 NOTE — Plan of Care (Signed)
  Problem: Consults Goal: RH GENERAL PATIENT EDUCATION Description: See Patient Education module for education specifics. Outcome: Progressing   Problem: RH BOWEL ELIMINATION Goal: RH STG MANAGE BOWEL WITH ASSISTANCE Description: STG Manage Bowel with mod I  Assistance. Outcome: Progressing Goal: RH STG MANAGE BOWEL W/MEDICATION W/ASSISTANCE Description: STG Manage Bowel with Medication with mod I  Assistance. Outcome: Progressing   Problem: RH BLADDER ELIMINATION Goal: RH STG MANAGE BLADDER WITH ASSISTANCE Description: STG Manage Bladder With toileting Assistance Outcome: Progressing Goal: RH STG MANAGE BLADDER WITH MEDICATION WITH ASSISTANCE Description: STG Manage Bladder With Medication With mod I Assistance. Outcome: Progressing   Problem: RH SKIN INTEGRITY Goal: RH STG SKIN FREE OF INFECTION/BREAKDOWN Description: Patient and wife will be able to manage skin with min assist Outcome: Progressing Goal: RH STG MAINTAIN SKIN INTEGRITY WITH ASSISTANCE Description: STG Maintain Skin Integrity With min Assistance. Outcome: Progressing Goal: RH STG ABLE TO PERFORM INCISION/WOUND CARE W/ASSISTANCE Description: STG Able To Perform Incision/Wound Care With Assistance. Outcome: Progressing   Problem: RH SAFETY Goal: RH STG ADHERE TO SAFETY PRECAUTIONS W/ASSISTANCE/DEVICE Description: STG Adhere to Safety Precautions With cues Assistance/Device. Outcome: Progressing   Problem: RH PAIN MANAGEMENT Goal: RH STG PAIN MANAGED AT OR BELOW PT'S PAIN GOAL Description: Pain < 4 with prns Outcome: Progressing   Problem: RH KNOWLEDGE DEFICIT GENERAL Goal: RH STG INCREASE KNOWLEDGE OF SELF CARE AFTER HOSPITALIZATION Description: Patient and wife will be able to manage care at discharge using educational resources independently Outcome: Progressing

## 2024-03-10 NOTE — Discharge Instructions (Addendum)
  Inpatient Rehab Discharge Instructions  JASHUN PUERTAS Discharge date and time: 03/12/24   Activities/Precautions/ Functional Status: Activity: no lifting, driving, or strenuous exercise for till cleared by MD Diet: low fat, low cholesterol diet Wound Care: none needed   Functional status:  ___ No restrictions      ___ Walk up steps independently ___ 24/7 supervision/assistance   ___ Walk up steps with assistance ___ Intermittent supervision/assistance  ___ Bathe/dress independently ___ Walk with walker     ___ Bathe/dress with assistance ___ Walk Independently     ___ Shower independently ___ Walk with assistance     ___ Shower with assistance _X__ No alcohol      ___ Return to work/school ________  Special Instructions:    My questions have been answered and I understand these instructions. I will adhere to these goals and the provided educational materials after my discharge from the hospital.  Patient/Caregiver Signature _______________________________ Date __________  Clinician Signature _______________________________________ Date __________  Please bring this form and your medication list with you to all your follow-up doctor's appointments. Information on my medicine - ELIQUIS  (apixaban )  This medication education was reviewed with me or my healthcare representative as part of my discharge preparation.    Why was Eliquis  prescribed for you? Eliquis  was prescribed for you to reduce the risk of a blood clot forming that can cause a stroke if you have a medical condition called atrial fibrillation (a type of irregular heartbeat).  What do You need to know about Eliquis  ? Take your Eliquis  TWICE DAILY - one tablet in the morning and one tablet in the evening with or without food. If you have difficulty swallowing the tablet whole please discuss with your pharmacist how to take the medication safely.  Take Eliquis  exactly as prescribed by your doctor and DO NOT stop  taking Eliquis  without talking to the doctor who prescribed the medication.  Stopping may increase your risk of developing a stroke.  Refill your prescription before you run out.  After discharge, you should have regular check-up appointments with your healthcare provider that is prescribing your Eliquis .  In the future your dose may need to be changed if your kidney function or weight changes by a significant amount or as you get older.  What do you do if you miss a dose? If you miss a dose, take it as soon as you remember on the same day and resume taking twice daily.  Do not take more than one dose of ELIQUIS  at the same time to make up a missed dose.  Important Safety Information A possible side effect of Eliquis  is bleeding. You should call your healthcare provider right away if you experience any of the following: Bleeding from an injury or your nose that does not stop. Unusual colored urine (red or dark brown) or unusual colored stools (red or black). Unusual bruising for unknown reasons. A serious fall or if you hit your head (even if there is no bleeding).  Some medicines may interact with Eliquis  and might increase your risk of bleeding or clotting while on Eliquis . To help avoid this, consult your healthcare provider or pharmacist prior to using any new prescription or non-prescription medications, including herbals, vitamins, non-steroidal anti-inflammatory drugs (NSAIDs) and supplements.  This website has more information on Eliquis  (apixaban ): http://[email or web address] .com/eliquis dena

## 2024-03-10 NOTE — Progress Notes (Signed)
 Patient ID: Levi [name], male   DOB: 01/20/1946, 78 y.o.   MRN: [id number]  Have reviewed team conference with pt and family. Both aware and agreeable with targeted d/c date of 10/17 and goals of Independent with assistive device.   He is set up with [name] PT/OT/SN and will receive IV antibiotics training from [facility] prior to discharge.   UPDATE:  Pam meeting with family at 1:30pm today for education.

## 2024-03-10 NOTE — Progress Notes (Signed)
 Occupational Therapy Weekly Progress Note  Patient Details  Name: Levi [name] MRN: [id number] Date of Birth: June 07, 1945  Beginning of progress report period: February 29, 2024 End of progress report period: March 10, 2024   Session 1: Today's Date: 03/10/2024 OT Individual Time: 9097-9069 OT Individual Time Calculation (min): 28 min   Session 2: Today's Date: 03/10/2024 OT Individual Time: 1010-1045 OT Individual Time Calculation (min): 35 min   Session 3: Today's Date: 03/10/2024 OT Individual Time: 8464-8359 OT Individual Time Calculation (min): 65 min   Patient has met 3 of 4 short term goals.  Patient is demonstrating improvement in slide board transfers. Patient has been limited with use of prosthetic due to redness on tibial tuberosity with caution to ensure patient does not develop wound. Patient demonstrated ability to complete ADLS with overall mod A level with assist level min to max depending on fatigue level. OT is educating patient and caregiver on ability to complete stand pivot and slide board transfers and increasing core strength to maximize safety in home environment. Patient demonstrates decreased safety awareness leading to need for 24 hour sup.  Patient continues to demonstrate the following deficits: muscle weakness, decreased cardiorespiratoy endurance, and decreased sitting balance, decreased standing balance, and decreased balance strategies and therefore will continue to benefit from skilled OT intervention to enhance overall performance with BADL and Reduce care partner burden.  Patient progressing towards revised LTG. Patient is making significantly slower progress than expected due to weakness and inability to consistently utilize prosthetic for mobility.    Continue plan of care.  OT Short Term Goals Week 1:  OT Short Term Goal 1 (Week 1): The pt will safely complete LB bathing/dressing with ModI incorporating AE as needed. OT Short Term Goal 1 -  Progress (Week 1): Progressing toward goal OT Short Term Goal 2 (Week 1): The pt will safely complete all functional t/f 's using a sliding board at [employer] OT Short Term Goal 2 - Progress (Week 1): Met OT Short Term Goal 3 (Week 1): The pt will safely participate in > 30 minutes of Occupational Therapy activity with minimal rest breaks. OT Short Term Goal 3 - Progress (Week 1): Met OT Short Term Goal 4 (Week 1): The pt will improve core strength by 1 grade to improve safety and Ind for functional task completion. OT Short Term Goal 4 - Progress (Week 1): Met Week 2:  OT Short Term Goal 1 (Week 2): STG=LTG d/t ELOS   Session 1: Skilled Therapeutic Interventions/Progress Updates:  Patient agreeable to participate in OT session. Reports 6/10 pain level in shoulder.   Patient participated in skilled OT session focusing on sit to stands in prosthetic. Patient able to complete sit to stands x5 with several verbal cues for placement and safety utilizing RW. Patient required several rest breaks during session. Patient completed sit to stands in preparation for decreasing caregiver support needed for functional transfers in home and safety. Patient returned to wc alarm on all needs in reach.  Session : 2 Skilled Therapeutic Interventions/Progress Updates:  Patient agreeable to participate in OT session. Reports 7/10 pain level in shoulder . Patient participated in skilled OT session focusing on functional transfer training. Completed family training with spouse on stand pivot transfers. Patient utilized prosthetic with foam dressing over read area to practice stand pivot transfers to increase patients ability to complete toileting, bathing, and LB ADLs with increased efficiency. Spouse completed hands on stand pivot transfer with OT supervision to increase spouse comfort  with transfer. Will require additional practice tomorrow.      Session 3: Skilled Therapeutic Interventions/Progress Updates:  Patient  agreeable to participate in OT session. Reports 7/10 pain level in shoulder . Patient received finishing toileting with Nursing when OT arrived. Patient and OT completed slide board transfers to both sides to increase patients ability to assist caregiver. Patient completed with CGA going to R side, mod A going to L side. Patient demonstrated decreased core strength and LE strength. Patient completed slide board transfer each direction x3. Patient then taken to gym to complete transfers to variety of surfaces. Patient then completed rolling and sit up activity to increase ability to don LE pants, prosthetic, and brief with decreased support. Patient then completed transfer back to bed with SUP A including rolling and scooting. Left in bed all needs in reach alarm on.    Therapy Documentation Precautions:  Precautions Precautions: Fall Recall of Precautions/Restrictions: Intact Precaution/Restrictions Comments: R BKA Restrictions Weight Bearing Restrictions Per Provider Order: No Other Position/Activity Restrictions: chronic R BKA, does not have prosthetic yet but was supposed to pick up from [facility] this week, MRSA  Therapy/Group: Individual Therapy  D'mariea L Reily Ilic 03/10/2024, 7:28 AM

## 2024-03-10 NOTE — Plan of Care (Signed)
  Problem: RH Bathing Goal: LTG Patient will bathe all body parts with assist levels (OT) Description: LTG: Patient will bathe all body parts with assist levels (OT) Flowsheets (Taken 03/10/2024 1250) LTG: Pt will perform bathing with assistance level/cueing: (with AE) Minimal Assistance - Patient > 75% Note: Downgraded secondary to functional abilities    Problem: RH Dressing Goal: LTG Patient will perform upper body dressing (OT) Description: LTG Patient will perform upper body dressing with assist, with/without cues (OT). Flowsheets (Taken 03/10/2024 1250) LTG: Pt will perform upper body dressing with assistance level of: Set up assist Note: Downgraded due to functional abilities  Goal: LTG Patient will perform lower body dressing w/assist (OT) Description: LTG: Patient will perform lower body dressing with assist, with/without cues in positioning using equipment (OT) Flowsheets (Taken 03/10/2024 1250) LTG: Pt will perform lower body dressing with assistance level of: (including prosthetic) Maximal Assistance - Patient 25 - 49% Note: Downgraded due to functional abilities    Problem: RH Toileting Goal: LTG Patient will perform toileting task (3/3 steps) with assistance level (OT) Description: LTG: Patient will perform toileting task (3/3 steps) with assistance level (OT)  Flowsheets (Taken 03/10/2024 1250) LTG: Pt will perform toileting task (3/3 steps) with assistance level: Moderate Assistance - Patient 50 - 74% Note: Downgraded due to functional abilities    Problem: RH Toilet Transfers Goal: LTG Patient will perform toilet transfers w/assist (OT) Description: LTG: Patient will perform toilet transfers with assist, with/without cues using equipment (OT) Flowsheets (Taken 03/10/2024 1250) LTG: Pt will perform toilet transfers with assistance level of: Moderate Assistance - Patient 50 - 74% Note: Downgraded due to functional abilities   Problem: RH Tub/Shower Transfers Goal: LTG  Patient will perform tub/shower transfers w/assist (OT) Description: LTG: Patient will perform tub/shower transfers with assist, with/without cues using equipment (OT) Flowsheets (Taken 03/10/2024 1250) LTG: Pt will perform tub/shower stall transfers with assistance level of: Moderate Assistance - Patient 50 - 74% Note: Downgraded due to functional abilities

## 2024-03-10 NOTE — Patient Care Conference (Signed)
 Inpatient RehabilitationTeam Conference and Plan of Care Update Date: 03/10/2024   Time: 9:52 AM    Patient Name: Levi [name]      Medical Record Number: [id number]  Date of Birth: Jul 24, 1945 Sex: Male         Room/Bed: 4W09C/4W09C-01 Payor Info: Payor: MEDICARE / Plan: MEDICARE PART A AND B / Product Type: *No Product type* /    Admit Date/Time:  02/27/2024  3:03 PM  Primary Diagnosis:  Debility  Hospital Problems: Principal Problem:   Debility Active Problems:   Acute heart failure with mildly reduced ejection fraction (HFmrEF) (HCC)   Leukocytosis    Expected Discharge Date: Expected Discharge Date: 03/12/24  Team Members Present: Physician leading conference: Dr. Murray Collier Social Worker Present: Waverly Gentry, LCSW-A Nurse Present: Barnie Ronde, RN PT Present: Doreene Orris, PT OT Present: Michaelyn Seip, OT     Current Status/Progress Goal Weekly Team Focus  Bowel/Bladder   Patient is mainly continent of bladder but will occasionally spill the urinal. He has been incontinent of bowel. Last BM 10/14   Patient will be continent of bowel and bladder   Provide prompted toileting. Ensure urinal is within reach and emptied to prevent spilling.    Swallow/Nutrition/ Hydration               ADL's   min to mod   Min to CG- downgrading   prostetic use, transfer training, balance, core strengthening.    Mobility   bed mobility: supervision sit to stand with min A from elevated surface, mod A from [facility] with use of prothetic; pt ability to wear prosthetic at the moment hindered due to skin lesion on tibial tuberosity; contacted [facility] from [facility] 10/13 and awiting response; sliding board transfers with CGA.   supervision/min A  D/C: 10/17, follow up HHPT; DME: pt has hospital bed, RW, WC, slide board from previous CIR stay; Barriers to DC: skin integrity/area of redness on tibial tuberosity, worse with mobility with prosthesis    Communication                 Safety/Cognition/ Behavioral Observations               Pain   Patient reports chronic pain to right shoulder. PRN oxycodone  on MAR.   Patient will have a pain score less than 4/10   Assess pain every shift and as needed. Provide pain intervention when necessary.    Skin   Scattered bruising and abrasions. Abrasions healing and scabbed. Redness and MASD to buttocks. Irritation noted on distal residual limb.   Patient will have continued wound healing and be free from further skin breakdown.  Assess skin every shift and as needed. Monitor buttocks and residual limb for further breakdown. Keep patient dry to heal MASD.      Discharge Planning:  Patient will discharge home with his spouse, [name] and daughter providing care/assistance when necessary. Has DME: Hospital bed, wheelchair and slide board. HH with [name] PT/OT/SN. Will recieve IV education from [employer] prior to discharge.   Team Discussion: Patient admitted with debility post MRSA bacteremia, remote BKA (prosthetic delivered 03/02/24) with cellulitis/?abscess on IV abx thru 03/22/24.   Patient on target to meet rehab goals: yes, currently needs min assist for sit - stand transfers wearing a prosthesis but needs mod assist getting up from a wheelchair.  Needs CGA - min assist for use of a slide-board for transfers, but min - mod assist for toileting.    *  See Care Plan and progress notes for long and short-term goals.   Revisions to Treatment Plan:  [facility] follow up on prosthetic fit; irritation to right tibial tuberosity   Teaching Needs: Safety, medications, skin care, residual limb care, prosthetic care/wear, transfers, toileting, etc.   Current Barriers to Discharge: Decreased caregiver support, Home enviroment access/layout, and Weight bearing restrictions and IV abx  Possible Resolutions to Barriers: Family education HH follow up services     Medical Summary Current Status: Debility, Azotemia, BKA,  hypokalemia, constipation, shoulder pain, HRmrEF  Barriers to Discharge: Electrolyte abnormality;Hypotension;Medical stability;Self-care education;Complicated Wound  Barriers to Discharge Comments: Debility, Azotemia, BKA, hypokalemia, constipation, shoulder pain, HRmrEF Possible Resolutions to [employer] Focus: monitor bka skin changes, encourage oral fluids, labs tomorrow, monitor wt   Continued Need for Acute Rehabilitation Level of Care: The patient requires daily medical management by a physician with specialized training in physical medicine and rehabilitation for the following reasons: Direction of a multidisciplinary physical rehabilitation program to maximize functional independence : Yes Medical management of patient stability for increased activity during participation in an intensive rehabilitation regime.: Yes Analysis of laboratory values and/or radiology reports with any subsequent need for medication adjustment and/or medical intervention. : Yes   I attest that I was present, lead the team conference, and concur with the assessment and plan of the team.   Fredericka Sober B 03/10/2024, 1:49 PM

## 2024-03-10 NOTE — Progress Notes (Signed)
 Physical Therapy Session Note  Patient Details  Name: Levi [name] MRN: [id number] Date of Birth: [birth year]  Today's Date: 03/10/2024 PT Individual Time: 8661-8565, 0800-0900 PT Individual Time Calculation (min): 56 min, 60 min  Short Term Goals: Week 2:  PT Short Term Goal 1 (Week 2): =LTG 2/2 to ELOS  Skilled Therapeutic Interventions/Progress Updates:      Treatment 1:  Pt supine in bed upon arrival. Pt reports his usual R shoulder pain but agreeable to therapy. Rest breaks and repositioning provided.  Redness on tibial tuberosity appeared better this AM prior to donning prosthesis. To don prosthesis pt required assist initially to place liner distally but able complete the task without physical assist.  Pt performed sit <> stand transfer to RW x2 trials with min A. While in standing pt worked on reducing UE support and improving standing tolerance by alternating lifting hands off RW with min A and verbal cueing to maintain hip/knee extension for upright standing posture. After this activity, PT doffed prosthesis and liner to assess redness and noted no change and therefore prosthesis donned again.  Pt requested to use the toilet. Pt performed squat pivot transfer EOB to WC with min A and verbal cues for head hip ratio. Pt then performed stand pivot transfer WC <> BSC over toilet using grab bars with min/mod A (with fatigue).  Pt remained seated in WC, seltbelt alarm donned/on, and all need within reach upon PT exiting room.   Treatment 2:  Pt seated in WC upon arrival. Pt denies pain and agreeable to therapy. [name] present for part of today's session for hand-on transfer training.  Pt transported dependent in [facility] to ortho gym for time/energy conservation. Pt and his wife expressed concerns with performing slide board transfers at home, regarding placement of the board as well as the pt experiencing a pinching feeling in his buttocks while wife placed board. PT demonstrated  and explained proper placement and positioning of slide board during WC <> mat transfers and patient performed transfer with supervision. [name] then demonstrated the transfer back for PT with improved placement and without discomfort.  [name] had to leave mid-session, however, primary PT was able to speak with her over the phone later in the session. [name] expressed concerns with discoloration of pt's distal residual limb as it was more purple/blue today than it has been in previous days. Increased swelling also noted in pt's residual limb this afternoon compared to this morning. During session, PT sought advisement from PA and MD regarding these concerns--PA and MD present to assess and recommended use of shrinker and elevation when not wearing prosthetic for improved circulation. Education provided to pt regarding fluctuations with edema with increased dependent positioning. Pt verbalized understanding and agreeable. Pt wearing shrinker and elevated LE in bed at end of session.   [name] plans to be here during tomorrow's session. Plan to continue working on slide board transfers to bed and car as this will be the safest transfer for pt to perform at home with supervision. Plan to allow pt to get further hands on training with stand pivot transfer to determine if pt and caregiver feel comfortable doing this transfer at home. Unable to trail during session 2/2 prosthetic not fitting appropriate with increased R LE edema.  Slide board transfer performed from [facility] to EOB with supervision. Pt able to reposition self in bed using L LE and B UE with verbal cueing. Bed alarm set, all needs in reach upon  PT exiting room.  Therapy Documentation Precautions:  Precautions Precautions: Fall Recall of Precautions/Restrictions: Intact Precaution/Restrictions Comments: R BKA Restrictions Weight Bearing Restrictions Per Provider Order: No Other Position/Activity Restrictions: chronic R BKA, does not  have prosthetic yet but was supposed to pick up from [facility] this week, MRSA  Therapy/Group: Individual Therapy  [name], DPT 03/10/2024, 12:31 PM

## 2024-03-10 NOTE — Progress Notes (Signed)
 PROGRESS NOTE   Subjective/Complaints:  Sitting in wheelchair today.  Continues to have right shoulder pain.  No new concerns otherwise. Reports having regular bowel movements  ROS: as per HPI. Denies fevers, CP, SOB, abd pain, N/V/D/constipation, or any other complaints at this time.  No new motor or sensory changes  + Right shoulder pain chronic- unchanged + Memory changes over several months-no recent changes  Objective:   No results found.  Recent Labs    03/08/24 0438  WBC 8.2  HGB 9.1*  HCT 29.8*  PLT 205    Recent Labs    03/08/24 0438 03/10/24 0322  NA 142 141  K 3.1* 3.5  CL 106 107  CO2 28 25  GLUCOSE 95 118*  BUN 27* 33*  CREATININE 0.77 0.71  CALCIUM  8.4* 8.1*         Intake/Output Summary (Last 24 hours) at 03/10/2024 0856 Last data filed at 03/10/2024 9191 Gross per 24 hour  Intake 620 ml  Output 1025 ml  Net -405 ml         Physical Exam: Vital Signs Blood pressure 115/61, pulse 68, temperature (!) 97.5 F (36.4 C), temperature source Oral, resp. rate 15, height 6' 2 (1.88 m), weight 69.4 kg, SpO2 97%.  Physical Exam Vitals and nursing note reviewed.  Constitutional: No apparent distress.  Sitting in wheelchair appears comfortable HENT: Atraumatic, normocephalic Eyes: PERRLA Cardiovascular: RRR, no murmurs/rub/gallops.  Respiratory: CTAB. No rales, rhonchi, or wheezing. On RA.  Abdomen: + bowel sounds, normoactive. No distention or tenderness.   Psych: Pleasant cooperative Ext: R BKA small skin irritation/abrasion on anterior residual limb over anterior tibia-foam border dressing in place, appears overall improved from yesterday Neuro: A&O x 3, follows commands, fluent speech, cranial nerves II through XII grossly intact, MSK: pain with right shoulder passive ROM Skin: buttocks a bit reddish but blanchable  PRIOR EXAMS: Skin: Papery skin with diffuse ecchymotic and  flaking BUE. RUE with evidence of third spacing and dependent edema around elbow.  LLE with multiple scabs covered with foam dressing.  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  Pressure area with scab on tibial prominence.     MSK:      + R BKA       + L second toe amputation       + Right shoulder range of motion 0 degrees in flexion, abduction; right shoulder visibly protruding forward from the socket.  No crepitus with passive range of motion.       Neurologic exam:  Cognition: AAO to person, place, time and event.  Language: Fluent, No substitutions or neoglisms. No dysarthria. Names 3/3 objects correctly.  Memory: Recalls 3/3 objects at 5 minutes. No apparent deficits  Insight: Good  insight into current condition.  Mood: Pleasant affect, appropriate mood.  Sensation: To light reduced in left foot and bilateral fingertips in stocking glove pattern.  + Difficulty with proprioception in right hand digits 3 through 5   Reflexes: 2+ in BL UE and LEs. Negative Hoffman's and babinski signs bilaterally.  CN: 2-12 grossly intact.  Coordination: No apparent tremors. No ataxia  Spasticity: MAS 0 in all extremities.  Strength:                RUE: 0/5 SA, 2/5 EF, 3/5 EE, 4/5 WE, 4/5 FF, 4/5 FA                LUE:  4/5 SA, 4/5 EF, 4/5 EE, 4/5 WE, 4/5 FF, 4/5 FA                RLE: 4/5 HF, 5/5 KE                 LLE:  4/5 HF, 5/5 KE, 3/5  DF, 3/5  EHL, 2/5  PF  Assessment/Plan: 1. Functional deficits which require 3+ hours per day of interdisciplinary therapy in a comprehensive inpatient rehab setting. Physiatrist is providing close team supervision and 24 hour management of active medical problems listed below. Physiatrist and rehab team continue to assess barriers to discharge/monitor patient progress toward functional and medical goals  Care Tool:  Bathing    Body parts bathed by patient: Right arm, Left arm, Chest, Abdomen, Front perineal area, Right upper leg,  Left upper leg, Face   Body parts bathed by helper: Buttocks, Right lower leg, Left lower leg     Bathing assist Assist Level: Moderate Assistance - Patient 50 - 74%     Upper Body Dressing/Undressing Upper body dressing   What is the patient wearing?: Pull over shirt    Upper body assist Assist Level: Set up assist    Lower Body Dressing/Undressing Lower body dressing      What is the patient wearing?: Pants     Lower body assist Assist for lower body dressing: Maximal Assistance - Patient 25 - 49%     Toileting Toileting    Toileting assist Assist for toileting: Moderate Assistance - Patient 50 - 74%     Transfers Chair/bed transfer  Transfers assist     Chair/bed transfer assist level: Minimal Assistance - Patient > 75%     Locomotion Ambulation   Ambulation assist   Ambulation activity did not occur: Safety/medical concerns  Assist level: Minimal Assistance - Patient > 75% Assistive device: Walker-rolling Max distance: 16 feet   Walk 10 feet activity   Assist  Walk 10 feet activity did not occur: Safety/medical concerns  Assist level: Minimal Assistance - Patient > 75%     Walk 50 feet activity   Assist Walk 50 feet with 2 turns activity did not occur: Safety/medical concerns         Walk 150 feet activity   Assist Walk 150 feet activity did not occur: Safety/medical concerns         Walk 10 feet on uneven surface  activity   Assist Walk 10 feet on uneven surfaces activity did not occur: Safety/medical concerns         Wheelchair     Assist Is the patient using a wheelchair?: Yes Type of Wheelchair: Manual    Wheelchair assist level: Supervision/Verbal cueing Max wheelchair distance: 75    Wheelchair 50 feet with 2 turns activity    Assist        Assist Level: Supervision/Verbal cueing   Wheelchair 150 feet activity     Assist      Assist Level: Moderate Assistance - Patient 50 - 74%   Blood  pressure 115/61, pulse 68, temperature (!) 97.5 F (36.4 C), temperature source Oral, resp. rate 15, height 6' 2 (1.88 m), weight 69.4 kg, SpO2 97%.  Medical Problem List and Plan:  1. Functional deficits secondary to debility secondary to MRSA bacteremia d/t pneumonia             -patient may shower             -ELOS/Goals: 14-16 days, [facility] PT/OT              -Continue CIR - Patient has BKA prosthetic device, continue prosthetic training -PT seen by SLP- did well overall-- cognition improving - Expected discharge 10/17 -Team conference today please see physician documentation under team conference tab, met with team  to discuss problems,progress, and goals. Formulized individual treatment plan based on medical history, underlying problem and comorbidities.    2.  Antithrombotics: -DVT/anticoagulation:  Pharmaceutical: Eliquis  5mg  BID             -antiplatelet therapy: N/A 3. Pain Management: Oxycodone  prn for severe pain. Tylenol  prn for  mild pain.  - 10/10 infrequent use of oxycodone .  Reviewed orthopedic note regarding right shoulder.  Patient seen by Dr. Addie with outpatient CT scan pending.  Follow-up with Dr. Addie outpatient after this is completed.  Shoulder surgery felt to be risky by Ortho due to comorbidities. 4. Mood/Behavior/Sleep: LCSW to follow for evaluation and support.              --trazodone  prn for insomnia. Melatonin 5mg  nightly             -antipsychotic agents: N/A 5. Neuropsych/cognition: This patient is capable of making decisions on his own behalf. 6. Skin/Wound Care:  Routine pressure relief measures.    7. Fluids/Electrolytes/Nutrition:  Monitor I/O. Monitor labs. Check CK qMon.   -GERD: protonix  40mg  BID 8. MRSA bacteremia: On Daptomycin X 4 weeks with EOT 03/22/24 --Ck-119 on 09/29. Recheck on Monday.   --Recurrent leucocytosis 13.7-->9.4-->11.8 -02/28/24 WBC 11.0 today, monitor.  - 10/9 WBC down to 8.4 -10/10 patient asked about contact precautions, will  need to continue due to MRSA 9. Indurated area left buttock/cellulitis?: Monitor for worsening. Augmentin  added on 09/30 w/ antibiotic duration to continue pending improvement.  --Repeat imaging in case of worsening to rule out abscess formation.   -03/07/24 no induration/cellulitis, but buttocks skin reddish and blanchable, will apply gerhardt's. Monitor closely.  -10/14 completed course of Augmentin , will discontinue 10. A fib: Monitor HR TID--on Eliquis . HR controlled on cardizem  180mg  daily.   11. HFmrEF: Monitor daily weights and for signs of overload.             --On Lipitor 20mg  daily, Eliquis --off Imdur  due to soft BP.    -02/28/24 added daily weights order  -02/29/24 wt down 10kg but highly doubt accuracy; monitor for trend -10/7 weight trending up again but weight still down from 10/3 when he was recorded to be 83.6 kg?  Do not see signs of fluid overload at this time continue to monitor trend -10/8 weight trending up however down from weight on 10/3.  Therapy may check weight in wheelchair to compare.  No signs of fluid overload noted -10/9 weight lower today at 70.8 kg, strongly suspect weights measurements have not been accurate given significant variation.  Does not appear to have signs of fluid overload  - 10/15 weight a little lower, encourage oral fluids see #19 Filed Weights   03/08/24 0500 03/09/24 0500 03/10/24 0455  Weight: 73 kg 72.1 kg 69.4 kg    11. COPD: On chronic prednisone  20 mg (unable to tolerate taper) -continue Brovana  BID with duo nebs prn.  - 10/13 discussed with pharmacy,  start Septra  for PJP prophylaxis 12. Hypokalemia: Being supplemented intermittently. Low normal currently. 10/4 3.5, monitor  -10/6 will order today  -10/13 potassium low at 3.1, 2 doses of 30 mEq potassium ordered  -10/15 start 10 mEq potassium daily 13. Hypomagnesemia: Improved with supplementation. Was 1.6-->2.0             -02/28/24 Mg 2.0 today, monitor.   -2.1 on 10/9 14.  Constipation: Last BM 09/30--on senna-->increase to 2 tabs daily and continue miralax  BID.             --MOM today. Increase as needed.  -02/29/24 LBM yesterday small, pt doesn't recall it, remain on current regimen for now and monitor output.  -10/6 Sorbitol 60ml today -03/07/24 LBM yesterday, cont regimen -10/13 small BM yesterday continue to monitor - 10/14 continue MiraLAX  and senna, prn sorbitol ordered -10/15 LBM yesterday   15.  Neuropathy: Followed by [facility].  --On Lyrica  75 mg TID --off duloxetine .  -- of note, ordered as 150mg  daily +75mg  nightly  - Patient was followed by neurology see #21   16. Anemia: Continue iron  supplement.   - 10/9 slightly lower at 9.1 continue to monitor trend 17. H/o BPH: Foley remains in place-->work on bowel regimen today and remove foley to start voiding trial tomorrow. Continue Flomax  0.4mg  nightly  -02/28/24 foley out this morning, nursing to monitor PVRs  -02/29/24 good UOP and low PVRs, cont monitoring  - 10/6 remains continent PVR 59 and 0, continue to follow  -10/8 continent bladder scans do not appear to be elevated-DC routine scans  - Continue Flomax  patient denies any dysuria or other symptoms-reviewed with patient today 10/15   18.  Right shoulder rotator cuff tear.  Last seen outpatient by Dr. [facility]ie [facility]. - Has outpatient CT shoulder ordered; would consider getting while he is inpatient to help plan outpatient surgery             - No formal weightbearing precautions  - Discussed potentially scheduling outpatient CT as above  19. Azotemia  -Increase BUN  -encourage oral fluids, appears to be off Lasix  currently  - Recheck pending today-- BUN 25 10/9, none since then, labs Monday.  - 10/13 BUN 27 creatinine 0.77-continue hold Lasix  encourage oral fluid  -10/15 Cr down/BUN higher- discuss increased oral fluids 20.  Possible orthostatic hypotension - 10/7 will monitor for symptoms with therapy, baseline BP appears stable.   Abdominal binder if needed.  Encourage fluids -10/8 continue to monitor BP with therapy, encourage fluids, LE compression/abd binder if needed  - Encourage fluids as above   Vitals:   03/06/24 2018 03/07/24 0409 03/07/24 1446 03/07/24 2145  BP: 122/74 123/73 104/73 (!) 145/68   03/08/24 0553 03/08/24 1020 03/08/24 1437 03/08/24 2017  BP: (!) 144/71 123/80 119/60 116/64   03/09/24 0501 03/09/24 1428 03/09/24 2030 03/10/24 0455  BP: 134/75 (!) 98/58 113/68 115/61     21.  Memory changes.  Appears more subacute versus chronic.  He has been following with neurology.  Reviewed recent notes patients wife indicated he had stable MRI however neurology note 7/30 indicates MRI was declined.  Neurology was considering referral to neuromuscular specialist for his neuropathy. - Continue follow-up with outpatient neurology, consideration of neuropsych testing -03/07/24 seems much more at his baseline compared to last stay     LOS: 12 days A FACE TO FACE EVALUATION WAS PERFORMED  Murray Collier 03/10/2024, 8:56 AM

## 2024-03-11 LAB — CBC
HCT: 30 % — ABNORMAL LOW (ref 39.0–52.0)
Hemoglobin: 9.1 g/dL — ABNORMAL LOW (ref 13.0–17.0)
MCH: 27.8 pg (ref 26.0–34.0)
MCHC: 30.3 g/dL (ref 30.0–36.0)
MCV: 91.7 fL (ref 80.0–100.0)
Platelets: 198 K/uL (ref 150–400)
RBC: 3.27 MIL/uL — ABNORMAL LOW (ref 4.22–5.81)
RDW: 18 % — ABNORMAL HIGH (ref 11.5–15.5)
WBC: 9.3 K/uL (ref 4.0–10.5)
nRBC: 0.2 % (ref 0.0–0.2)

## 2024-03-11 MED ORDER — DAPTOMYCIN IV (FOR PTA / DISCHARGE USE ONLY): 700.0000 mg | INTRAVENOUS | 0 refills | Status: AC

## 2024-03-11 MED ORDER — DAPTOMYCIN IV (FOR PTA / DISCHARGE USE ONLY): 100.0000 mg | INTRAVENOUS | 0 refills | Status: DC

## 2024-03-11 MED ORDER — DAPTOMYCIN-SODIUM CHLORIDE 700-0.9 MG/100ML-% IV SOLN: 700.0000 mg | Freq: Every day | INTRAVENOUS | 0 refills | Status: DC

## 2024-03-11 NOTE — Plan of Care (Signed)
  Problem: RH Balance Goal: LTG Patient will maintain dynamic standing balance (PT) Description: LTG:  Patient will maintain dynamic standing balance with assistance during mobility activities (PT) Outcome: Adequate for Discharge   Problem: Sit to Stand Goal: LTG:  Patient will perform sit to stand with assistance level (PT) Description: LTG:  Patient will perform sit to stand with assistance level (PT) Outcome: Adequate for Discharge   Problem: RH Balance Goal: LTG Patient will maintain dynamic sitting balance (PT) Description: LTG:  Patient will maintain dynamic sitting balance with assistance during mobility activities (PT) Outcome: Completed/Met   Problem: RH Bed Mobility Goal: LTG Patient will perform bed mobility with assist (PT) Description: LTG: Patient will perform bed mobility with assistance, with/without cues (PT). Outcome: Completed/Met   Problem: RH Bed to Chair Transfers Goal: LTG Patient will perform bed/chair transfers w/assist (PT) Description: LTG: Patient will perform bed to chair transfers with assistance (PT). Outcome: Completed/Met   Problem: RH Car Transfers Goal: LTG Patient will perform car transfers with assist (PT) Description: LTG: Patient will perform car transfers with assistance (PT). Outcome: Completed/Met   Problem: RH Wheelchair Mobility Goal: LTG Patient will propel w/c in controlled environment (PT) Description: LTG: Patient will propel wheelchair in controlled environment, # of feet with assist (PT) Outcome: Completed/Met Goal: LTG Patient will propel w/c in home environment (PT) Description: LTG: Patient will propel wheelchair in home environment, # of feet with assistance (PT). Outcome: Completed/Met

## 2024-03-11 NOTE — Progress Notes (Signed)
 Occupational Therapy Discharge Summary  Patient Details  Name: Levi [name] MRN: [id number] Date of Birth: July 24, 1945  Date of Discharge from OT service:March 12, 2024    Patient has met 7 of 8 long term goals due to improved activity tolerance, improved balance, ability to compensate for deficits, functional use of  RIGHT upper extremity, and improved coordination.  Patient to discharge at [facility] Assist level.  Patient's care partner is independent to provide the necessary physical assistance at discharge.    Reasons goals not met: one patient goal for toileting not met due to decreased core strength, standing balance, and ROM with R Shoulder limiting ability to clean thoroughly and pull up pants.   Recommendation:  Patient will benefit from ongoing skilled OT services in home health setting to continue to advance functional skills in the area of BADL and Reduce care partner burden.  Equipment: No equipment provided  Reasons for discharge: treatment goals met and discharge from hospital  Patient/family agrees with progress made and goals achieved: Yes  OT Discharge Precautions/Restrictions  Restrictions Weight Bearing Restrictions Per Provider Order: No General   Vital Signs   Pain Pain Assessment Pain Scale: 0-10 Pain Score: 0-No pain ADL ADL Eating: Modified independent Where Assessed-Eating: Bed level Grooming: Modified independent Where Assessed-Grooming: Edge of bed Upper Body Bathing: Setup Where Assessed-Upper Body Bathing: Edge of bed Lower Body Bathing: Minimal assistance Where Assessed-Lower Body Bathing: Edge of bed Upper Body Dressing: Setup Where Assessed-Upper Body Dressing: Edge of bed Lower Body Dressing: Maximal assistance Where Assessed-Lower Body Dressing: Edge of bed, Bed level Toileting: Maximal assistance Where Assessed-Toileting: Bedside Commode Toilet Transfer: Furniture conservator/restorer Method: Office manager: Extra wide drop arm bedside commode Tub/Shower Transfer: Moderate assistance Tub/Shower Transfer Method:  (SBT) Tub/Shower Equipment: Grab bars, Shower seat with back (based on sliding board transfer) Film/video editor: Moderate assistance Film/video editor Method: Best boy: Grab bars, Information systems manager with back Administrator, Civil Service Brief Interview for Mental Status (BIMS) Repetition of Three Words (First Attempt): 3 Temporal Orientation: Year: Correct Temporal Orientation: Month: Accurate within 5 days Temporal Orientation: Day: Correct Recall: Sock: Yes, no cue required Recall: Blue: Yes, no cue required Recall: Bed: No, could not recall BIMS Summary Score: 13 Sensation Sensation Light Touch: Impaired by gross assessment Additional Comments: B LE/UE neuropathy; pt able to detect light touch in all dermatomes L LE however endorses N/T. Pt unable to detect light touch on R BKA. Coordination Gross Motor Movements are Fluid and Coordinated: No Fine Motor Movements are Fluid and Coordinated: No Coordination and Movement Description: limited 2/2 R LE BKA, L LE neuropathy, and R UE ROM limited 2/2 rototor cuff injury (surgery on hold per pt) Motor  Motor Motor: Other (comment) Motor - Skilled Clinical Observations: R LE BKA Mobility  Bed Mobility Bed Mobility: Rolling Right;Rolling Left;Supine to Sit;Sit to Supine Rolling Right: Independent with assistive device Rolling Left: Independent with assistive device Supine to Sit: Independent with assistive device Sit to Supine: Supervision/Verbal cueing;Independent with assistive device  Trunk/Postural Assessment  Cervical Assessment Cervical Assessment: Exceptions to [facility] (forward head) Thoracic Assessment Thoracic Assessment: Exceptions to [facility] (rounded shoulders) Lumbar Assessment Lumbar Assessment: Exceptions to [facility] (posterior pelvic tilt) Postural  Control Postural Control: Deficits on evaluation Righting Reactions: delayed Protective Responses: delayed  Balance Balance Balance Assessed: Yes Static Sitting Balance Static Sitting - Balance Support: Left upper extremity supported;Bilateral upper extremity  supported Static Sitting - Level of Assistance: 5: Stand by assistance;6: Modified independent (Device/Increase time) Dynamic Sitting Balance Dynamic Sitting - Balance Support: Bilateral upper extremity supported Dynamic Sitting - Level of Assistance: 5: Stand by assistance Extremity/Trunk Assessment       Claudene Nest [facility] 03/11/2024, 12:09 PM

## 2024-03-11 NOTE — Progress Notes (Signed)
 PHARMACY CONSULT NOTE FOR:  OUTPATIENT  PARENTERAL ANTIBIOTIC THERAPY (OPAT)  Indication: MRSA bacteremia in the setting with extensive hardware to knees, hips, left ankle, shoulder, and back  Regimen: Daptomycin IV 700 mg q24h  End date: 03/22/2024  IV antibiotic discharge orders are pended. To discharging provider:  please sign these orders via discharge navigator,  Select New Orders & click on the button choice - Manage This Unsigned Work.    Thank you for allowing pharmacy to be a part of this patient's care.  Feliciano Close, PharmD PGY2 Infectious Diseases Pharmacy Resident  03/11/2024 12:59 PM

## 2024-03-11 NOTE — Progress Notes (Signed)
 PROGRESS NOTE   Subjective/Complaints:  Patient is working with therapy in his room today.  No new complaints or concerns. Patient's wife is concerned about his mood being decreased with all that is going to. Reports regular Bms.   ROS: as per HPI. Denies fevers, headaches, CP, SOB, abd pain, N/V/D/constipation, or any other complaints at this time.  + Right shoulder pain chronic- unchanged + Memory changes over several months-no recent changes  Objective:   No results found.  Recent Labs    03/11/24 0424  WBC 9.3  HGB 9.1*  HCT 30.0*  PLT 198    Recent Labs    03/10/24 0322  NA 141  K 3.5  CL 107  CO2 25  GLUCOSE 118*  BUN 33*  CREATININE 0.71  CALCIUM  8.1*         Intake/Output Summary (Last 24 hours) at 03/11/2024 1554 Last data filed at 03/11/2024 0800 Gross per 24 hour  Intake 238 ml  Output 550 ml  Net -312 ml         Physical Exam: Vital Signs Blood pressure 108/63, pulse (!) 41, temperature 98 F (36.7 C), resp. rate 16, height 6' 2 (1.88 m), weight 68.9 kg, SpO2 98%.  Physical Exam Vitals and nursing note reviewed.  Constitutional: No apparent distress.  Sitting in wheelchair appears comfortable HENT: Atraumatic, normocephalic Eyes: PERRLA Cardiovascular: RRR, no murmurs/rub/gallops.  Respiratory: CTAB. No rales, rhonchi, or wheezing. On RA.  Abdomen: + bowel sounds, normoactive. No distention or tenderness.   Psych: Pleasant cooperative Ext: R BKA small skin irritation/blister on anterior residual limb over anterior tibia-foam border dressing in place,mild discoloration at distal residual limb has improved Neuro: A&O x 3, follows commands, fluent speech, cranial nerves II through XII grossly intact, MSK: pain with right shoulder passive ROM Skin: buttocks a bit reddish but blanchable  PRIOR EXAMS: Skin: Papery skin with diffuse ecchymotic and flaking BUE. RUE with evidence  of third spacing and dependent edema around elbow.  LLE with multiple scabs covered with foam dressing.  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  Pressure area with scab on tibial prominence.     MSK:      + R BKA       + L second toe amputation       + Right shoulder range of motion 0 degrees in flexion, abduction; right shoulder visibly protruding forward from the socket.  No crepitus with passive range of motion.       Neurologic exam:  Cognition: AAO to person, place, time and event.  Language: Fluent, No substitutions or neoglisms. No dysarthria. Names 3/3 objects correctly.  Memory: Recalls 3/3 objects at 5 minutes. No apparent deficits  Insight: Good  insight into current condition.  Mood: Pleasant affect, appropriate mood.  Sensation: To light reduced in left foot and bilateral fingertips in stocking glove pattern.  + Difficulty with proprioception in right hand digits 3 through 5   Reflexes: 2+ in BL UE and LEs. Negative Hoffman's and babinski signs bilaterally.  CN: 2-12 grossly intact.  Coordination: No apparent tremors. No ataxia  Spasticity: MAS 0 in all extremities.  Strength:                RUE: 0/5 SA, 2/5 EF, 3/5 EE, 4/5 WE, 4/5 FF, 4/5 FA                LUE:  4/5 SA, 4/5 EF, 4/5 EE, 4/5 WE, 4/5 FF, 4/5 FA                RLE: 4/5 HF, 5/5 KE                 LLE:  4/5 HF, 5/5 KE, 3/5  DF, 3/5  EHL, 2/5  PF  Assessment/Plan: 1. Functional deficits which require 3+ hours per day of interdisciplinary therapy in a comprehensive inpatient rehab setting. Physiatrist is providing close team supervision and 24 hour management of active medical problems listed below. Physiatrist and rehab team continue to assess barriers to discharge/monitor patient progress toward functional and medical goals  Care Tool:  Bathing    Body parts bathed by patient: Right arm, Left arm, Chest, Abdomen, Front perineal area, Right upper leg, Left upper leg, Face   Body  parts bathed by helper: Buttocks, Left lower leg Body parts n/a: Right lower leg   Bathing assist Assist Level: Minimal Assistance - Patient > 75%     Upper Body Dressing/Undressing Upper body dressing   What is the patient wearing?: Pull over shirt    Upper body assist Assist Level: Set up assist    Lower Body Dressing/Undressing Lower body dressing      What is the patient wearing?: Pants     Lower body assist Assist for lower body dressing: Maximal Assistance - Patient 25 - 49%     Toileting Toileting    Toileting assist Assist for toileting: Maximal Assistance - Patient 25 - 49%     Transfers Chair/bed transfer  Transfers assist     Chair/bed transfer assist level: Minimal Assistance - Patient > 75%     Locomotion Ambulation   Ambulation assist   Ambulation activity did not occur: Safety/medical concerns  Assist level: Minimal Assistance - Patient > 75% Assistive device: Walker-rolling Max distance: 16 feet   Walk 10 feet activity   Assist  Walk 10 feet activity did not occur: Safety/medical concerns  Assist level: Minimal Assistance - Patient > 75%     Walk 50 feet activity   Assist Walk 50 feet with 2 turns activity did not occur: Safety/medical concerns         Walk 150 feet activity   Assist Walk 150 feet activity did not occur: Safety/medical concerns         Walk 10 feet on uneven surface  activity   Assist Walk 10 feet on uneven surfaces activity did not occur: Safety/medical concerns         Wheelchair     Assist Is the patient using a wheelchair?: Yes Type of Wheelchair: Manual    Wheelchair assist level: Supervision/Verbal cueing Max wheelchair distance: 75    Wheelchair 50 feet with 2 turns activity    Assist        Assist Level: Supervision/Verbal cueing   Wheelchair 150 feet activity     Assist      Assist Level: Moderate Assistance - Patient 50 - 74%   Blood pressure 108/63,  pulse (!) 41, temperature 98 F (36.7 C), resp. rate 16, height 6' 2 (1.88 m), weight 68.9 kg, SpO2 98%.  Medical Problem List and Plan: 1. Functional deficits  secondary to debility secondary to MRSA bacteremia d/t pneumonia             -patient may shower             -ELOS/Goals: 14-16 days, SPV PT/OT              -Continue CIR - Patient has BKA prosthetic device, continue prosthetic training -PT seen by SLP- did well overall-- cognition improving - Expected discharge 03/12/24    2.  Antithrombotics: -DVT/anticoagulation:  Pharmaceutical: Eliquis  5mg  BID             -antiplatelet therapy: N/A 3. Pain Management: Oxycodone  prn for severe pain. Tylenol  prn for  mild pain.  - 10/10 infrequent use of oxycodone .  Reviewed orthopedic note regarding right shoulder.  Patient seen by Dr. Addie with outpatient CT scan pending.  Follow-up with Dr. Addie outpatient after this is completed.  Shoulder surgery felt to be risky by Ortho due to comorbidities. 4. Mood/Behavior/Sleep: LCSW to follow for evaluation and support.              --trazodone  prn for insomnia. Melatonin 5mg  nightly             -antipsychotic agents: N/A  -Discuss potential medication for mood/pain tomorrow such as cymbalta  5. Neuropsych/cognition: This patient is capable of making decisions on his own behalf. 6. Skin/Wound Care:  Routine pressure relief measures.    7. Fluids/Electrolytes/Nutrition:  Monitor I/O. Monitor labs. Check CK qMon.   -GERD: protonix  40mg  BID 8. MRSA bacteremia: On Daptomycin X 4 weeks with EOT 03/22/24 --Ck-119 on 09/29. Recheck on Monday.   --Recurrent leucocytosis 13.7-->9.4-->11.8 -02/28/24 WBC 11.0 today, monitor.  - 10/9 WBC down to 8.4 -10/10 patient asked about contact precautions, will need to continue due to MRSA 9. Indurated area left buttock/cellulitis?: Monitor for worsening. Augmentin  added on 09/30 w/ antibiotic duration to continue pending improvement.  --Repeat imaging in case of  worsening to rule out abscess formation.   -03/07/24 no induration/cellulitis, but buttocks skin reddish and blanchable, will apply gerhardt's. Monitor closely.  -10/14 completed course of Augmentin , will discontinue 10. A fib: Monitor HR TID--on Eliquis . HR controlled on cardizem  180mg  daily.   11. HFmrEF: Monitor daily weights and for signs of overload.             --On Lipitor 20mg  daily, Eliquis --off Imdur  due to soft BP.    -02/28/24 added daily weights order  -02/29/24 wt down 10kg but highly doubt accuracy; monitor for trend -10/7 weight trending up again but weight still down from 10/3 when he was recorded to be 83.6 kg?  Do not see signs of fluid overload at this time continue to monitor trend -10/8 weight trending up however down from weight on 10/3.  Therapy may check weight in wheelchair to compare.  No signs of fluid overload noted -10/9 weight lower today at 70.8 kg, strongly suspect weights measurements have not been accurate given significant variation.  Does not appear to have signs of fluid overload  - 10/16 wt tending down , no signs fluid overload, encouraging oral fluids see #19 Filed Weights   03/09/24 0500 03/10/24 0455 03/11/24 0500  Weight: 72.1 kg 69.4 kg 68.9 kg    11. COPD: On chronic prednisone  20 mg (unable to tolerate taper) -continue Brovana  BID with duo nebs prn.  - 10/13 discussed with pharmacy, start Septra  for PJP prophylaxis 12. Hypokalemia: Being supplemented intermittently. Low normal currently. 10/4 3.5, monitor  -10/6 will order  today  -10/13 potassium low at 3.1, 2 doses of 30 mEq potassium ordered  -10/15 start 10 mEq potassium daily  Recheck pending 13. Hypomagnesemia: Improved with supplementation. Was 1.6-->2.0             -02/28/24 Mg 2.0 today, monitor.   -2.1 on 10/9 14. Constipation: Last BM 09/30--on senna-->increase to 2 tabs daily and continue miralax  BID.             --MOM today. Increase as needed.  -02/29/24 LBM yesterday small,  pt doesn't recall it, remain on current regimen for now and monitor output.  -10/6 Sorbitol 60ml today -03/07/24 LBM yesterday, cont regimen -10/13 small BM yesterday continue to monitor - 10/14 continue MiraLAX  and senna, prn sorbitol ordered -10/15 LBM yesterday   15.  Neuropathy: Followed by [facility].  --On Lyrica  75 mg TID --off duloxetine .  -- of note, ordered as 150mg  daily +75mg  nightly  - Patient was followed by neurology see #21   16. Anemia: Continue iron  supplement.   - 10/9 slightly lower at 9.1 continue to monitor trend 17. H/o BPH: Foley remains in place-->work on bowel regimen today and remove foley to start voiding trial tomorrow. Continue Flomax  0.4mg  nightly  -02/28/24 foley out this morning, nursing to monitor PVRs  -02/29/24 good UOP and low PVRs, cont monitoring  - 10/6 remains continent PVR 59 and 0, continue to follow  -10/8 continent bladder scans do not appear to be elevated-DC routine scans  - Continue Flomax  patient denies any dysuria or other symptoms-reviewed with patient today 10/15   18.  Right shoulder rotator cuff tear.  Last seen outpatient by Dr. Addie [facility]. - Has outpatient CT shoulder ordered; would consider getting while he is inpatient to help plan outpatient surgery             - No formal weightbearing precautions  - Discussed potentially scheduling outpatient CT as above  19. Azotemia  -Increase BUN  -encourage oral fluids, appears to be off Lasix  currently  - Recheck pending today-- BUN 25 10/9, none since then, labs Monday.  - 10/13 BUN 27 creatinine 0.77-continue hold Lasix  encourage oral fluid  -10/15 Cr down/BUN higher- discuss increased oral fluids  -10/16 recheck pending, encouraging fluids- pt reports he is working on this 20.  Possible orthostatic hypotension - 10/7 will monitor for symptoms with therapy, baseline BP appears stable.  Abdominal binder if needed.  Encourage fluids -10/8 continue to monitor BP with therapy,  encourage fluids, LE compression/abd binder if needed  - Encourage fluids as above   Vitals:   03/08/24 0553 03/08/24 1020 03/08/24 1437 03/08/24 2017  BP: (!) 144/71 123/80 119/60 116/64   03/09/24 0501 03/09/24 1428 03/09/24 2030 03/10/24 0455  BP: 134/75 (!) 98/58 113/68 115/61   03/10/24 1309 03/10/24 2222 03/11/24 0543 03/11/24 1439  BP: 106/63 125/63 134/79 108/63     21.  Memory changes.  Appears more subacute versus chronic.  He has been following with neurology.  Reviewed recent notes patients wife indicated he had stable MRI however neurology note 7/30 indicates MRI was declined.  Neurology was considering referral to neuromuscular specialist for his neuropathy. - Continue follow-up with outpatient neurology, consideration of neuropsych testing -03/07/24 seems much more at his baseline compared to last stay     LOS: 13 days A FACE TO FACE EVALUATION WAS PERFORMED  Levi [name] 03/11/2024, 3:54 PM

## 2024-03-11 NOTE — Progress Notes (Signed)
 Occupational Therapy Session Note  Patient Details  Name: Levi [name] MRN: [id number] Date of Birth: [birth year]  Today's Date: 03/11/2024 OT Individual Time: 1000-1045 OT Individual Time Calculation (min): 45 min    Short Term Goals: Week 1:  OT Short Term Goal 1 (Week 1): The pt will safely complete LB bathing/dressing with ModI incorporating AE as needed. OT Short Term Goal 1 - Progress (Week 1): Progressing toward goal OT Short Term Goal 2 (Week 1): The pt will safely complete all functional t/f 's using a sliding board at [employer] OT Short Term Goal 2 - Progress (Week 1): Met OT Short Term Goal 3 (Week 1): The pt will safely participate in > 30 minutes of Occupational Therapy activity with minimal rest breaks. OT Short Term Goal 3 - Progress (Week 1): Met OT Short Term Goal 4 (Week 1): The pt will improve core strength by 1 grade to improve safety and Ind for functional task completion. OT Short Term Goal 4 - Progress (Week 1): Met Week 2:  OT Short Term Goal 1 (Week 2): STG=LTG d/t ELOS  Skilled Therapeutic Interventions/Progress Updates:    1:1 self care retraining including his wife. Pt received in the w/c. Focus on toileting and toilet transfers. Pt performed transfers to and from the [facility] over the commode with contact guard with extra time (just like he did PTA). Praticed toileting in two ways. With the Prosthesis donned just for toileting tasks for ease of care giver. Pt performed sit to stand with min to mod A with RW with wife and therapist with toileting with max A. Also performed toileting with a modified stand / squat/ lateral leans with max A with wife. Performed UB bathing and clothing change with setup. Pt left sitting up in w/c. REviewed amputee support group resources.   Therapy Documentation Precautions:  Precautions Precautions: Fall Recall of Precautions/Restrictions: Intact Precaution/Restrictions Comments: R BKA Restrictions Weight Bearing Restrictions Per  Provider Order: No Other Position/Activity Restrictions: chronic R BKA, does not have prosthetic yet but was supposed to pick up from [facility] this week, MRSA  Pain: Pain Assessment Pain Scale: 0-10 Pain Score: 0-No pain   Therapy/Group: Individual Therapy  Claudene Nest [facility] 03/11/2024, 11:28 AM

## 2024-03-11 NOTE — Progress Notes (Signed)
 Physical Therapy Discharge Summary  Patient Details  Name: Levi [name] MRN: [id number] Date of Birth: 25-Jul-1945  Date of Discharge from PT service:March 11, 2024  Today's Date: 03/11/2024 PT Individual Time: 0817-0929, [id number] PT Individual Time Calculation (min): 72 min, 72 min  Patient has met 6 of 8 long term goals due to improved activity tolerance, improved balance, improved postural control, increased strength, and ability to compensate for deficits.  Patient to discharge at a wheelchair level Supervision.   Patient's care partner is independent to provide the necessary physical and cognitive assistance at discharge.  Reasons goals not met: Pt requires min-mod A with sit to stand and dynamic standing balance with RW and R LE prosthesis 2/2 to weakness, pain, and fatigue.  Recommendation:  Patient will benefit from ongoing skilled PT services in home health setting to continue to advance safe functional mobility, address ongoing impairments in strength, balance, endurance, gait, and minimize fall risk.  Equipment:  Pt has RW, hospital bed, slide board, BSC, and WC from previos CIR stay.  Reasons for discharge: treatment goals met and discharge from hospital  Patient/family agrees with progress made and goals achieved: Yes  PT Discharge Precautions/Restrictions Precautions Precautions: Fall Recall of Precautions/Restrictions: Impaired Precaution/Restrictions Comments: R BKA, new R LE prosthesis Restrictions Weight Bearing Restrictions Per Provider Order: No Pain Interference Pain Interference Pain Effect on Sleep: 3. Frequently Pain Interference with Therapy Activities: 1. Rarely or not at all Pain Interference with Day-to-Day Activities: 1. Rarely or not at all Vision/Perception  Vision - History Ability to See in Adequate Light: 0 Adequate Perception Perception: Within Functional Limits Praxis Praxis: WFL  Cognition Overall Cognitive Status: History of  cognitive impairments - at baseline Arousal/Alertness: Awake/alert Orientation Level: Oriented X4 Attention: Sustained Sustained Attention: Appears intact Memory: Impaired Memory Impairment: Decreased recall of new information;Decreased short term memory Awareness: Appears intact (however poor decision making) Problem Solving: Impaired Safety/Judgment: Appears intact (however poor decision making) Sensation Sensation Light Touch: Impaired by gross assessment Additional Comments: B LE/UE neuropathy; pt able to detect light touch in all dermatomes L LE however endorses N/T. Pt unable to detect light touch on R BKA. Coordination Gross Motor Movements are Fluid and Coordinated: No Fine Motor Movements are Fluid and Coordinated: No Coordination and Movement Description: limited 2/2 R LE BKA, L LE neuropathy, and R UE ROM limited 2/2 rototor cuff injury (surgery on hold per pt) Motor  Motor Motor: Other (comment) Motor - Skilled Clinical Observations: R LE BKA  Mobility Bed Mobility Bed Mobility: Rolling Right;Rolling Left;Supine to Sit;Sit to Supine Rolling Right: Independent with assistive device Rolling Left: Independent with assistive device Supine to Sit: Independent with assistive device;Supervision/Verbal cueing Sit to Supine: Supervision/Verbal cueing Transfers Transfers: Sit to Stand;Stand to Sit;Lateral/Scoot Transfers;Stand Pivot Transfers Sit to Stand: Moderate Assistance - Patient 50-74%;Minimal Assistance - Patient > 75% (with prosthesis donned; fluccuates with fatigue) Stand to Sit: Minimal Assistance - Patient > 75% Stand Pivot Transfers: Moderate Assistance - Patient 50 - 74%;Maximal Assistance - Patient 25 - 49% (R LE prosthesis donned) Stand Pivot Transfer Details: Verbal cues for gait pattern;Verbal cues for sequencing;Verbal cues for safe use of DME/AE;Verbal cues for technique;Verbal cues for precautions/safety Lateral/Scoot Transfers: Supervision/Verbal  cueing Transfer (Assistive device): Other (Comment) (slide board) Locomotion  Gait Ambulation: Yes Gait Assistance: Minimal Assistance - Patient > 75% (R LE prosthesis donned) Gait Distance (Feet): 21 Feet Assistive device: Rolling walker Gait Assistance Details: Verbal cues for gait pattern;Verbal cues for sequencing;Verbal cues for safe use  of DME/AE;Verbal cues for technique;Verbal cues for precautions/safety Gait Gait: Yes Gait Pattern: Impaired Gait Pattern: Step-to pattern;Decreased step length - right;Decreased step length - left;Decreased stance time - right;Decreased stance time - left;Decreased stride length;Decreased hip/knee flexion - right;Decreased hip/knee flexion - left;Decreased weight shift to right;Left flexed knee in stance;Right flexed knee in stance;Trunk flexed Gait velocity: decreased Stairs / Additional Locomotion Stairs: No Pick up small object from the floor assist level: Total Assistance - Patient < 25% Wheelchair Mobility Wheelchair Mobility: Yes Wheelchair Assistance: Supervision/Verbal cueing Wheelchair Propulsion: Both upper extremities;Left lower extremity Wheelchair Parts Management: Needs assistance Distance: 150 ft  Trunk/Postural Assessment  Cervical Assessment Cervical Assessment: Exceptions to [facility] (forward head) Thoracic Assessment Thoracic Assessment: Exceptions to [facility] (rounded shoulders) Lumbar Assessment Lumbar Assessment: Exceptions to [facility] (posterior pelvic tilt) Postural Control Postural Control: Deficits on evaluation Righting Reactions: delayed Protective Responses: delayed  Balance Balance Balance Assessed: Yes Static Sitting Balance Static Sitting - Balance Support: Left upper extremity supported;Bilateral upper extremity supported Static Sitting - Level of Assistance: 5: Stand by assistance Dynamic Sitting Balance Dynamic Sitting - Balance Support: Bilateral upper extremity supported Dynamic Sitting - Level of Assistance: 5:  Stand by assistance Extremity Assessment  RLE Assessment RLE Assessment: Exceptions to [facility] General Strength Comments: grossly 3+/5 seated position LLE Assessment LLE Assessment: Exceptions to [facility] General Strength Comments: in seated position grossly 4-/5 except hip flexion and abduction 3+/5, pt demonstrates tendency to invert and adduct L LE   Today's Interventions:  Treatment Session 1:  Pt supine in bed upon arrival. Patient reports 7/10 pain in his R shoulder but agreeable to therapy. Rest breaks and repositioning provided.  PT observed pt's R residual limb with MD and wife present. Less edema noted and color of distal had returned to baseline compared to yesterday afternoon.  Worked on functional strengthening with transfers and WC mobility.  Pt performed rolling L/R in bed using bed rails with mod I. Pt sat to EOB with use of bed rails with supervision.  Pt performed slide board transfer to [facility] with supervision and verbal cueing though improved recall of sequencing noted this AM.  Pt propelled WC 150 ft using B UE and L LE with mod I overall.  Pt performed slide board transfer with supervision and cueing from his wife WC <> EOB x1 with supervision. Pt and his wife verbalized comfort with this transfer.  Pt seated in WC, seatbelt alarm on, all needs within reach, and wife present upon PT exiting room.  Treatment Session 2:  Pt supine in bed upon arrival. Pt reports his his usual pain in his R shoulder but agreeable to therapy. [name] present for entire session.  Pt and his wife demonstrated sitting EOB to WC slide board transfer with good mechanics and both reported feeling comfortable with the transfer.  Pt transported dependent in [facility] to ortho gym for time/energy conservation.  Pt and his wife practiced slide board transfer [facility] <> car simulator with supervision. Good placement of board by wife. Pt required verbal cueing from PT for proper sequencing for lifting B LE  into car. When transferring back to [facility], pt and his wife required verbal cueing to bring B LE out of car prior to placing the slide board. Both verbalized understanding and able to correct. Practiced car transfer again and patient able to perform with supervision and improved recall of sequencing and overall mechanics.  PT donned R LE prosthesis in order to practice sit to stands. Pt performed sit to  stand WC <> RW x 2 trials with min A. Prosthesis doffed after transfers, shrinkers donned, and pt's R LE elevated in WC.  PT verbally reviewed pressure relief techniques with pt for seated and supine positions. PT emphasized importance of regular implementation of these techniques to reduce risk for pressure injuries and low back/buttock irritation. Pt verbalized understanding.  Pt seated in WC, seatbelt alarm on, all needs within reach, and all pt/wife questions answered upon PT exiting room.  [name], DPT 03/11/2024, 12:58 PM

## 2024-03-12 ENCOUNTER — Ambulatory Visit: Admitting: Orthopedic Surgery

## 2024-03-12 ENCOUNTER — Other Ambulatory Visit (HOSPITAL_COMMUNITY): Payer: Self-pay

## 2024-03-12 DIAGNOSIS — F39 Unspecified mood [affective] disorder: Secondary | ICD-10-CM

## 2024-03-12 DIAGNOSIS — E440 Moderate protein-calorie malnutrition: Secondary | ICD-10-CM | POA: Insufficient documentation

## 2024-03-12 DIAGNOSIS — R7989 Other specified abnormal findings of blood chemistry: Secondary | ICD-10-CM

## 2024-03-12 LAB — BASIC METABOLIC PANEL WITH GFR
Anion gap: 11 (ref 5–15)
BUN: 28 mg/dL — ABNORMAL HIGH (ref 8–23)
CO2: 26 mmol/L (ref 22–32)
Calcium: 8.6 mg/dL — ABNORMAL LOW (ref 8.9–10.3)
Chloride: 106 mmol/L (ref 98–111)
Creatinine, Ser: 0.79 mg/dL (ref 0.61–1.24)
GFR, Estimated: >60 mL/min (ref >60)
Glucose, Bld: 97 mg/dL (ref 70–99)
Potassium: 3.8 mmol/L (ref 3.5–5.1)
Sodium: 143 mmol/L (ref 135–145)

## 2024-03-12 MED ORDER — SENNA 8.6 MG PO TABS
2.0000 | ORAL_TABLET | Freq: Every day | ORAL | 0 refills | Status: DC
  Filled 2024-03-12: qty 120, 60d supply, fill #0

## 2024-03-12 MED ORDER — HYDROCERIN EX CREA
1.0000 | TOPICAL_CREAM | Freq: Two times a day (BID) | CUTANEOUS | 0 refills | Status: DC
  Filled 2024-03-12: qty 454, 57d supply, fill #0

## 2024-03-12 MED ORDER — ACETAMINOPHEN 325 MG PO TABS: 325.0000 mg | ORAL_TABLET | ORAL | Status: AC | PRN

## 2024-03-12 MED ORDER — ADULT MULTIVITAMIN W/MINERALS CH: 1.0000 | ORAL_TABLET | Freq: Every day | ORAL | Status: DC

## 2024-03-12 MED ORDER — GERHARDT'S BUTT CREAM: 1.0000 | TOPICAL_CREAM | Freq: Three times a day (TID) | CUTANEOUS | Status: DC

## 2024-03-12 MED ORDER — ESCITALOPRAM OXALATE 10 MG PO TABS: 10.0000 mg | ORAL_TABLET | Freq: Every day | ORAL | Status: DC

## 2024-03-12 MED ORDER — SULFAMETHOXAZOLE-TRIMETHOPRIM 400-80 MG PO TABS
1.0000 | ORAL_TABLET | Freq: Every day | ORAL | 0 refills | Status: DC
  Filled 2024-03-12: qty 60, 60d supply, fill #0

## 2024-03-12 MED ORDER — JUVEN PO PACK: 1.0000 | PACK | Freq: Two times a day (BID) | ORAL | Status: DC

## 2024-03-12 MED ORDER — FERROUS GLUCONATE 324 (38 FE) MG PO TABS
324.0000 mg | ORAL_TABLET | Freq: Every day | ORAL | 0 refills | Status: AC
  Filled 2024-03-12: qty 30, 30d supply, fill #0

## 2024-03-12 MED ORDER — TAMSULOSIN HCL 0.4 MG PO CAPS
0.4000 mg | ORAL_CAPSULE | Freq: Every day | ORAL | 0 refills | Status: AC
  Filled 2024-03-12: qty 30, 30d supply, fill #0

## 2024-03-12 MED ORDER — MELATONIN 5 MG PO TABS
5.0000 mg | ORAL_TABLET | Freq: Every day | ORAL | 0 refills | Status: DC
  Filled 2024-03-12: qty 30, 30d supply, fill #0

## 2024-03-12 MED ORDER — PANTOPRAZOLE SODIUM 40 MG PO TBEC
40.0000 mg | DELAYED_RELEASE_TABLET | Freq: Two times a day (BID) | ORAL | 0 refills | Status: AC
  Filled 2024-03-12: qty 60, 30d supply, fill #0

## 2024-03-12 MED ORDER — PREDNISONE 5 MG PO TABS: 20.0000 mg | ORAL_TABLET | Freq: Every day | ORAL | Status: DC

## 2024-03-12 MED ORDER — ESCITALOPRAM OXALATE 10 MG PO TABS: 10.0000 mg | ORAL_TABLET | Freq: Every day | ORAL | 0 refills | Status: DC

## 2024-03-12 MED ORDER — OXYCODONE HCL 5 MG PO TABS
5.0000 mg | ORAL_TABLET | ORAL | 0 refills | Status: AC | PRN
  Filled 2024-03-12: qty 30, 5d supply, fill #0

## 2024-03-12 MED ORDER — POTASSIUM CHLORIDE CRYS ER 10 MEQ PO TBCR
10.0000 meq | EXTENDED_RELEASE_TABLET | Freq: Every day | ORAL | 0 refills | Status: DC
  Filled 2024-03-12: qty 30, 30d supply, fill #0

## 2024-03-12 NOTE — Progress Notes (Signed)
 Inpatient Rehabilitation Care Coordinator Discharge Note   Patient Details  Name: Levi [name] MRN: [id number] Date of Birth: 12/18/1945   Discharge location: Home with wife, Levi [name], providing care and assistance as needed  Length of Stay: 13 days  Discharge activity level: Contact Guard/Touching assist  Home/community participation: Active in the community  Patient response un:Yzjouy Literacy - How often do you need to have someone help you when you read instructions, pamphlets, or other written material from your doctor or pharmacy?: Never  Patient response un:Dnrpjo Isolation - How often do you feel lonely or isolated from those around you?: Never  Services provided included: MD, RD, PT, OT, SLP, RN, CM, TR, Pharmacy, SW, Neuropsych  Financial Services:  Field seismologist Utilized: Music therapist / MEDICARE PART A AND B  Choices offered to/list presented to: Patient/Family  Follow-up services arranged:  Home Health Home Health Agency: [name]   Patient response to transportation need: Is the patient able to respond to transportation needs?: Yes In the past 12 months, has lack of transportation kept you from medical appointments or from getting medications?: No In the past 12 months, has lack of transportation kept you from meetings, work, or from getting things needed for daily living?: No   Patient/Family verbalized understanding of follow-up arrangements:  Yes  Individual responsible for coordination of the follow-up plan: Patient/Family  Confirmed correct DME delivered: [employer]  Loreli 03/12/2024    Comments (or additional information): Family participated in family education and is able to address 24/7 care needs. Patient set up with [name] for PT/OT/SN and will go OP for his prosthesis.   Summary of Stay    Date/Time Discharge Planning CSW  03/10/24 1019 Patient will discharge home with his spouse, Levi [name] and daughter providing  care/assistance when necessary. Has DME: Hospital bed, wheelchair and slide board. HH with [name] PT/OT/SN. Will recieve IV education from [employer] prior to discharge. DS  03/09/24 1309 Patient will discharge home with his spouse, Levi [name] and daughter providing care/assistance when necessary. Has DME: Hospital bed, wheelchair and slide board. HH with [name] PT/OT/SN. Will recieve IV education from [employer] prior to discharge. DS  03/01/24 1103 Patient plans to discharge home with his spouse, Levi [name] and daughter providing care/assistance when necessary. Has DME: Hospital bed, wheelchair and slide board. HH with [name] PT/OT/SN. Awaiting therapy follow up recommendations. DS  03/01/24 1101 Patient plans to discharge home with his spouse, Levi [name] and daughter providing care/assistance when necessary. Has DME: Hospital bed, wheelchair and slide board. Awaiting therapy follow up recommendations. DS       [employer]  Loreli

## 2024-03-12 NOTE — Progress Notes (Signed)
 PROGRESS NOTE   Subjective/Complaints:  Patient scheduled for discharge home today.  Patient's wife noted chronic decrease in his mood, patient agrees that his medical conditions have been worsening his mood.  Reports he has been drinking more fluids.  ROS: as per HPI. Denies fevers, headaches, CP, SOB, abd pain, N/V/D/constipation, or any other complaints at this time.  + Right shoulder pain chronic- unchanged + Memory changes over several months-no recent changes + Decreased memory  Objective:   No results found.  Recent Labs    03/11/24 0424  WBC 9.3  HGB 9.1*  HCT 30.0*  PLT 198    Recent Labs    03/10/24 0322 03/12/24 1149  NA 141 143  K 3.5 3.8  CL 107 106  CO2 25 26  GLUCOSE 118* 97  BUN 33* 28*  CREATININE 0.71 0.79  CALCIUM  8.1* 8.6*         Intake/Output Summary (Last 24 hours) at 03/12/2024 1717 Last data filed at 03/12/2024 0754 Gross per 24 hour  Intake 218 ml  Output 300 ml  Net -82 ml         Physical Exam: Vital Signs Blood pressure 132/66, pulse 68, temperature (!) 97.2 F (36.2 C), resp. rate 16, height 6' 2 (1.88 m), weight 67.1 kg, SpO2 99%.  Physical Exam Vitals and nursing note reviewed.  Constitutional: No apparent distress.  Sitting in wheelchair appears comfortable HENT: Atraumatic, normocephalic Eyes: PERRLA Cardiovascular: RRR, no murmurs/rub/gallops.  Respiratory: CTAB. No rales, rhonchi, or wheezing. On RA.  Abdomen: + bowel sounds, normoactive. No distention or tenderness.   Psych: Cooperative, appropriate.  A little flat at times Ext: R BKA small skin irritation/blister on anterior residual limb over anterior tibia-foam border dressing in place,mild discoloration at distal residual limb is much improved Neuro: A&O x 3, follows commands, fluent speech, cranial nerves II through XII grossly intact MSK: pain with right shoulder passive ROM Skin: Warm on visible  surfaces  PRIOR EXAMS: Skin: Papery skin with diffuse ecchymotic and flaking BUE. RUE with evidence of third spacing and dependent edema around elbow.  LLE with multiple scabs covered with foam dressing.  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  Pressure area with scab on tibial prominence.     MSK:      + R BKA       + L second toe amputation       + Right shoulder range of motion 0 degrees in flexion, abduction; right shoulder visibly protruding forward from the socket.  No crepitus with passive range of motion.       Neurologic exam:  Cognition: AAO to person, place, time and event.  Language: Fluent, No substitutions or neoglisms. No dysarthria. Names 3/3 objects correctly.  Memory: Recalls 3/3 objects at 5 minutes. No apparent deficits  Insight: Good  insight into current condition.  Mood: Pleasant affect, appropriate mood.  Sensation: To light reduced in left foot and bilateral fingertips in stocking glove pattern.  + Difficulty with proprioception in right hand digits 3 through 5   Reflexes: 2+ in BL UE and LEs. Negative Hoffman's and babinski signs bilaterally.  CN: 2-12 grossly intact.  Coordination:  No apparent tremors. No ataxia  Spasticity: MAS 0 in all extremities.       Strength:                RUE: 0/5 SA, 2/5 EF, 3/5 EE, 4/5 WE, 4/5 FF, 4/5 FA                LUE:  4/5 SA, 4/5 EF, 4/5 EE, 4/5 WE, 4/5 FF, 4/5 FA                RLE: 4/5 HF, 5/5 KE                 LLE:  4/5 HF, 5/5 KE, 3/5  DF, 3/5  EHL, 2/5  PF  Assessment/Plan: 1. Functional deficits which require 3+ hours per day of interdisciplinary therapy in a comprehensive inpatient rehab setting. Physiatrist is providing close team supervision and 24 hour management of active medical problems listed below. Physiatrist and rehab team continue to assess barriers to discharge/monitor patient progress toward functional and medical goals  Care Tool:  Bathing    Body parts bathed by patient:  Right arm, Left arm, Chest, Abdomen, Front perineal area, Right upper leg, Left upper leg, Face   Body parts bathed by helper: Buttocks, Left lower leg Body parts n/a: Right lower leg   Bathing assist Assist Level: Minimal Assistance - Patient > 75%     Upper Body Dressing/Undressing Upper body dressing   What is the patient wearing?: Pull over shirt    Upper body assist Assist Level: Set up assist    Lower Body Dressing/Undressing Lower body dressing      What is the patient wearing?: Pants     Lower body assist Assist for lower body dressing: Maximal Assistance - Patient 25 - 49%     Toileting Toileting    Toileting assist Assist for toileting: Maximal Assistance - Patient 25 - 49%     Transfers Chair/bed transfer  Transfers assist     Chair/bed transfer assist level: Supervision/Verbal cueing (slide board)     Locomotion Ambulation   Ambulation assist   Ambulation activity did not occur: Safety/medical concerns  Assist level: Minimal Assistance - Patient > 75% (R LE prosthesis donned) Assistive device: Walker-rolling Max distance: 21   Walk 10 feet activity   Assist  Walk 10 feet activity did not occur: Safety/medical concerns  Assist level: Minimal Assistance - Patient > 75% Assistive device: Walker-rolling   Walk 50 feet activity   Assist Walk 50 feet with 2 turns activity did not occur: Safety/medical concerns (weakness/pain/fatigue)         Walk 150 feet activity   Assist Walk 150 feet activity did not occur: Safety/medical concerns         Walk 10 feet on uneven surface  activity   Assist Walk 10 feet on uneven surfaces activity did not occur: Safety/medical concerns         Wheelchair     Assist Is the patient using a wheelchair?: Yes Type of Wheelchair: Manual    Wheelchair assist level: Supervision/Verbal cueing Max wheelchair distance: 150    Wheelchair 50 feet with 2 turns activity    Assist         Assist Level: Supervision/Verbal cueing   Wheelchair 150 feet activity     Assist      Assist Level: Supervision/Verbal cueing   Blood pressure 132/66, pulse 68, temperature (!) 97.2 F (36.2 C), resp. rate 16, height 6' 2 (  1.88 m), weight 67.1 kg, SpO2 99%.  Medical Problem List and Plan: 1. Functional deficits secondary to debility secondary to MRSA bacteremia d/t pneumonia             -patient may shower             -ELOS/Goals: 14-16 days, [facility] PT/OT              -Continue CIR - Patient has BKA prosthetic device, continue prosthetic training -PT seen by SLP- did well overall-- cognition improving - Expected discharge today  2.  Antithrombotics: -DVT/anticoagulation:  Pharmaceutical: Eliquis  5mg  BID             -antiplatelet therapy: N/A 3. Pain Management: Oxycodone  prn for severe pain. Tylenol  prn for  mild pain.  - 10/10 infrequent use of oxycodone .  Reviewed orthopedic note regarding right shoulder.  Patient seen by Dr. Addie with outpatient CT scan pending.  Follow-up with Dr. Addie outpatient after this is completed.  Shoulder surgery felt to be risky by Ortho due to comorbidities. 4. Mood/Behavior/Sleep: LCSW to follow for evaluation and support.              --trazodone  prn for insomnia. Melatonin 5mg  nightly             -antipsychotic agents: N/A  - Start Lexapro 10 mg daily, patient reports he did not feel that Cymbalta  helped with his mood much in the past 5. Neuropsych/cognition: This patient is capable of making decisions on his own behalf. 6. Skin/Wound Care:  Routine pressure relief measures.    7. Fluids/Electrolytes/Nutrition:  Monitor I/O. Monitor labs. Check CK qMon.   -GERD: protonix  40mg  BID 8. MRSA bacteremia: On Daptomycin X 4 weeks with EOT 03/22/24 --Ck-119 on 09/29. Recheck on Monday.   --Recurrent leucocytosis 13.7-->9.4-->11.8 -02/28/24 WBC 11.0 today, monitor.  - 10/9 WBC down to 8.4 -10/10 patient asked about contact precautions, will  need to continue due to MRSA 9. Indurated area left buttock/cellulitis?: Monitor for worsening. Augmentin  added on 09/30 w/ antibiotic duration to continue pending improvement.  --Repeat imaging in case of worsening to rule out abscess formation.   -03/07/24 no induration/cellulitis, but buttocks skin reddish and blanchable, will apply gerhardt's. Monitor closely.  -10/14 completed course of Augmentin , will discontinue 10. A fib: Monitor HR TID--on Eliquis . HR controlled on cardizem  180mg  daily.   11. HFmrEF: Monitor daily weights and for signs of overload.             --On Lipitor 20mg  daily, Eliquis --off Imdur  due to soft BP.    -02/28/24 added daily weights order  -02/29/24 wt down 10kg but highly doubt accuracy; monitor for trend -10/7 weight trending up again but weight still down from 10/3 when he was recorded to be 83.6 kg?  Do not see signs of fluid overload at this time continue to monitor trend -10/8 weight trending up however down from weight on 10/3.  Therapy may check weight in wheelchair to compare.  No signs of fluid overload noted -10/9 weight lower today at 70.8 kg, strongly suspect weights measurements have not been accurate given significant variation.  Does not appear to have signs of fluid overload  - 10/16-17 wt tending down , no signs fluid overload, encouraging oral fluids see #19, he appears to have fair p.o. intake.  Continue monitor with PCP Filed Weights   03/10/24 0455 03/11/24 0500 03/12/24 0500  Weight: 69.4 kg 68.9 kg 67.1 kg    11. COPD: On chronic prednisone  20  mg (unable to tolerate taper) -continue Brovana  BID with duo nebs prn.  - 10/13 discussed with pharmacy, start Septra  for PJP prophylaxis 12. Hypokalemia: Being supplemented intermittently. Low normal currently. 10/4 3.5, monitor  -10/6 will order today  -10/13 potassium low at 3.1, 2 doses of 30 mEq potassium ordered  -10/15 start 10 mEq potassium daily  -10/17 stable 3.8, follow-up with PCP  for further monitoring 13. Hypomagnesemia: Improved with supplementation. Was 1.6-->2.0             -02/28/24 Mg 2.0 today, monitor.   -2.1 on 10/9 14. Constipation: Last BM 09/30--on senna-->increase to 2 tabs daily and continue miralax  BID.             --MOM today. Increase as needed.  -02/29/24 LBM yesterday small, pt doesn't recall it, remain on current regimen for now and monitor output.  -10/6 Sorbitol 60ml today -03/07/24 LBM yesterday, cont regimen -10/13 small BM yesterday continue to monitor - 10/14 continue MiraLAX  and senna, prn sorbitol ordered -10/17 LBM yesterday   15.  Neuropathy: Followed by [facility].  --On Lyrica  75 mg TID --off duloxetine .  -- of note, ordered as 150mg  daily +75mg  nightly  - Patient was followed by neurology see #21   16. Anemia: Continue iron  supplement.   - 10/9 slightly lower at 9.1 continue to monitor trend 17. H/o BPH: Foley remains in place-->work on bowel regimen today and remove foley to start voiding trial tomorrow. Continue Flomax  0.4mg  nightly  -02/28/24 foley out this morning, nursing to monitor PVRs  -02/29/24 good UOP and low PVRs, cont monitoring  - 10/6 remains continent PVR 59 and 0, continue to follow  -10/8 continent bladder scans do not appear to be elevated-DC routine scans  - Continue Flomax  patient denies any dysuria or other symptoms-reviewed with patient today 10/15   18.  Right shoulder rotator cuff tear.  Last seen outpatient by Dr. Addie at [facility]. - Has outpatient CT shoulder ordered; would consider getting while he is inpatient to help plan outpatient surgery             - No formal weightbearing precautions  - Discussed potentially scheduling outpatient CT as above  19. Azotemia  -Increase BUN  -encourage oral fluids, appears to be off Lasix  currently  - Recheck pending today-- BUN 25 10/9, none since then, labs Monday.  - 10/13 BUN 27 creatinine 0.77-continue hold Lasix  encourage oral fluid  -10/15 Cr  down/BUN higher- discuss increased oral fluids  -10/16 recheck pending, encouraging fluids- pt reports he is working on this  - 10/17 BUN down to 28 continue encourage oral fluids, recheck with PCP 20.  Possible orthostatic hypotension - 10/7 will monitor for symptoms with therapy, baseline BP appears stable.  Abdominal binder if needed.  Encourage fluids -10/8 continue to monitor BP with therapy, encourage fluids, LE compression/abd binder if needed  - Encourage fluids as above   Vitals:   03/08/24 1437 03/08/24 2017 03/09/24 0501 03/09/24 1428  BP: 119/60 116/64 134/75 (!) 98/58   03/09/24 2030 03/10/24 0455 03/10/24 1309 03/10/24 2222  BP: 113/68 115/61 106/63 125/63   03/11/24 0543 03/11/24 1439 03/11/24 2040 03/12/24 0326  BP: 134/79 108/63 119/68 132/66     21.  Memory changes.  Appears more subacute versus chronic.  He has been following with neurology.  Reviewed recent notes patients wife indicated he had stable MRI however neurology note 7/30 indicates MRI was declined.  Neurology was considering referral to neuromuscular  specialist for his neuropathy. - Continue follow-up with outpatient neurology, consideration of neuropsych testing -03/07/24 seems much more at his baseline compared to last stay - Start Lexapro 10 mg as above, may be attention decreased due to mood changes     LOS: 14 days A FACE TO FACE EVALUATION WAS PERFORMED  Murray Collier 03/12/2024, 5:17 PM

## 2024-03-12 NOTE — Plan of Care (Signed)
  Problem: RH Toileting Goal: LTG Patient will perform toileting task (3/3 steps) with assistance level (OT) Description: LTG: Patient will perform toileting task (3/3 steps) with assistance level (OT)  Outcome: Not Met (add Reason)   Problem: RH Grooming Goal: LTG Patient will perform grooming w/assist,cues/equip (OT) Description: LTG: Patient will perform grooming with assist, with/without cues using equipment (OT) Outcome: Completed/Met   Problem: RH Bathing Goal: LTG Patient will bathe all body parts with assist levels (OT) Description: LTG: Patient will bathe all body parts with assist levels (OT) Outcome: Completed/Met   Problem: RH Dressing Goal: LTG Patient will perform upper body dressing (OT) Description: LTG Patient will perform upper body dressing with assist, with/without cues (OT). Outcome: Completed/Met Goal: LTG Patient will perform lower body dressing w/assist (OT) Description: LTG: Patient will perform lower body dressing with assist, with/without cues in positioning using equipment (OT) Outcome: Completed/Met   Problem: RH Functional Use of Upper Extremity Goal: LTG Patient will use RT/LT upper extremity as a (OT) Description: LTG: Patient will use right/left upper extremity as a stabilizer/gross assist/diminished/nondominant/dominant level with assist, with/without cues during functional activity (OT) Outcome: Completed/Met   Problem: RH Toilet Transfers Goal: LTG Patient will perform toilet transfers w/assist (OT) Description: LTG: Patient will perform toilet transfers with assist, with/without cues using equipment (OT) Outcome: Completed/Met   Problem: RH Tub/Shower Transfers Goal: LTG Patient will perform tub/shower transfers w/assist (OT) Description: LTG: Patient will perform tub/shower transfers with assist, with/without cues using equipment (OT) Outcome: Completed/Met

## 2024-03-12 NOTE — Discharge Summary (Signed)
 Physician Discharge Summary  Patient ID: Levi [name] MRN: [id number] DOB/AGE: 77/11/1945 76 y.o.  Admit date: 02/27/2024 Discharge date: 03/12/2024  Discharge Diagnoses:  Principal Problem:   Debility Active Problems:   Paroxysmal atrial fibrillation (HCC)   GERD (gastroesophageal reflux disease) status post Nissen fundoplication   COPD (chronic obstructive pulmonary disease) (HCC)   Acquired absence of right leg below knee (HCC)   BPH (benign prostatic hyperplasia)   Primary osteoarthritis, right shoulder   AKI (acute kidney injury)   MRSA bacteremia   Leukocytosis   Malnutrition of moderate degree   Discharged Condition: stable  Significant Diagnostic Studies: N/A   Labs:  Basic Metabolic Panel:    Latest Ref Rng & Units 03/12/2024   11:49 AM 03/10/2024    3:22 AM 03/08/2024    4:38 AM  BMP  Glucose 70 - 99 mg/dL 97  881  95   BUN 8 - 23 mg/dL 28  33  27   Creatinine 0.61 - 1.24 mg/dL 9.20  9.28  9.22   Sodium 135 - 145 mmol/L 143  141  142   Potassium 3.5 - 5.1 mmol/L 3.8  3.5  3.1   Chloride 98 - 111 mmol/L 106  107  106   CO2 22 - 32 mmol/L 26  25  28    Calcium  8.9 - 10.3 mg/dL 8.6  8.1  8.4      CBC:    Latest Ref Rng & Units 03/11/2024    4:24 AM 03/08/2024    4:38 AM 03/04/2024    4:47 AM  CBC  WBC 4.0 - 10.5 K/uL 9.3  8.2  8.4   Hemoglobin 13.0 - 17.0 g/dL 9.1  9.1  9.1   Hematocrit 39.0 - 52.0 % 30.0  29.8  29.5   Platelets 150 - 400 K/uL 198  205  242      CBG: No results for input(s): GLUCAP in the last 168 hours.  Brief HPI:   Levi [name] is a 78 y.o. male with history of HTN, PAF, GERD, COPD-steroid-dependent, right foot osteomyelitis s/p right-BKA (with CIR stay), chronic sacral decub, peripheral neuropathy with recent falls a as well as admission for PNA.  He was readmitted on 02/21/2024 with generalized weakness, malaise and poor p.o. intake with fevers, URI and was found to have hypotension with lactic acidosis and LLL consolidation  concerning for sepsis due to pneumonia.  He was started on IV fluids as well as broad-spectrum antibiotics and blood cultures done revealed MRSA bacteremia.  Dr. Overton was consulted for input and recommended daptomycin for 4 total weeks of therapy.  He was also found to have left buttock erythema with diffuse subcu edema with induration soft tissue of left buttock, ID recommended addition of Augmentin  x 2 weeks.  Foley was placed at admission and remains in place.  PT and OT was working with patient was requiring mod assist overall.  CIR was recommended to functional decline.   Hospital Course: TIGE MEAS was admitted to rehab 02/27/2024 for inpatient therapies to consist of PT and OT at least three hours five days a week. Past admission physiatrist, therapy team and rehab RN have worked together to provide customized collaborative inpatient rehab. His blood pressures  and heart rate were monitored on TID basis and has been controlled on current regimen. Weights have been monitored daily, is trending down and no signs of overload noted. No signs or induration or cellulitis noted and he completed 14 day course of  Augmentin  on 10/14. He continues on daptomycin with end date of 10/27.  Septra  added for PJP prophylaxis on 10/13.  Foley was removed past admission and he is voiding without difficulty.  Serial check of lytes showed pre-renal azotemia and he was encouraged to increase fluid intake. K dur added to supplement hypokalemia and recommend follow up labs to determine for continued need. GERD symptoms have been managed with use of PPI. Chronic right shoulder pain is unchanged and oxycodone  has been used infrequently.  BKA site is well healed but cyanosis noted with increased use in therapy and he was advised to take breaks and don for mobility only. Area of breakdown on tibial tuberosity showed blistering and foam dressing was added for padding. Follow up CBC showed reactive leucocytosis has resolved and H/H is  stable. Depressed mood reported by wife and Lexapro was added to help with mood stabilization.  He has made gains during his stay and requires min to mod assist overall. He will continue to receive follow up HHPT, HHOT and HHRN by [facility] after discharge.    Rehab course: During patient's stay in rehab weekly team conferences were held to monitor patient's progress, set goals and discuss barriers to discharge. At admission, patient required mod assist with mobility and min to mod assist ADL tasks. He  has had improvement in activity tolerance, balance, postural control as well as ability to compensate for deficits. He is able to complete ADL tasks with min assist. He requires min to mod assist for sit to stand and is limited by RLE weakness and fatigue. He is able to propel his wheelchair. Wife has been supportive and family education has been completed.    Disposition: Home with home health  Diet: Heart healthy.  Special Instructions: Recommend repeat CBC/BMET in 1-2 weeks to monitor Hgb, renal status and potassium level Keep padded dressing on pre-tibial abraded area.    Discharge Instructions     [facility] pharmacist to adjust dose for Vancomycin , Aminoglycosides and other anti-infective therapies as requested by physician.   Complete by: As directed    [facility] pharmacist to adjust dose for Vancomycin , Aminoglycosides and other anti-infective therapies as requested by physician.   Complete by: As directed    [facility] to provide Cath Flo 2mg    Complete by: As directed    Administer for PICC line occlusion and as ordered by physician for other access device issues.   [facility] to provide Cath Flo 2mg    Complete by: As directed    Administer for PICC line occlusion and as ordered by physician for other access device issues.   Anaphylaxis Kit: Provided to treat any anaphylactic reaction to the medication being provided to  the patient if First Dose or when requested by physician   Complete by: As directed    Epinephrine  1mg /ml vial / amp: Administer 0.3mg  (0.19ml) subcutaneously once for moderate to severe anaphylaxis, nurse to call physician and pharmacy when reaction occurs and call 911 if needed for immediate care   Diphenhydramine  50mg /ml IV vial: Administer 25-50mg  IV/IM PRN for first dose reaction, rash, itching, mild reaction, nurse to call physician and pharmacy when reaction occurs   Sodium Chloride  0.9% NS 500ml IV: Administer if needed for hypovolemic blood pressure drop or as ordered by physician after call to physician with anaphylactic reaction   Anaphylaxis Kit: Provided to treat any anaphylactic reaction to the medication being provided to the patient if First Dose or  when requested by physician   Complete by: As directed    Epinephrine  1mg /ml vial / amp: Administer 0.3mg  (0.53ml) subcutaneously once for moderate to severe anaphylaxis, nurse to call physician and pharmacy when reaction occurs and call 911 if needed for immediate care   Diphenhydramine  50mg /ml IV vial: Administer 25-50mg  IV/IM PRN for first dose reaction, rash, itching, mild reaction, nurse to call physician and pharmacy when reaction occurs   Sodium Chloride  0.9% NS 500ml IV: Administer if needed for hypovolemic blood pressure drop or as ordered by physician after call to physician with anaphylactic reaction   Change dressing on IV access line weekly and PRN   Complete by: As directed    Change dressing on IV access line weekly and PRN   Complete by: As directed    Flush IV access with Sodium Chloride  0.9% and Heparin  10 units/ml or 100 units/ml   Complete by: As directed    Flush IV access with Sodium Chloride  0.9% and Heparin  10 units/ml or 100 units/ml   Complete by: As directed    Home infusion instructions - [facility]   Complete by: As directed    Instructions: Flush IV access with Sodium Chloride  0.9% and Heparin   10units/ml or 100units/ml   Change dressing on IV access line: Weekly and PRN   Instructions Cath Flo 2mg : Administer for PICC Line occlusion and as ordered by physician for other access device   [facility] pharmacist to adjust dose for: Vancomycin , Aminoglycosides and other anti-infective therapies as requested by physician   Home infusion instructions - [facility]   Complete by: As directed    Instructions: Flush IV access with Sodium Chloride  0.9% and Heparin  10units/ml or 100units/ml   Change dressing on IV access line: Weekly and PRN   Instructions Cath Flo 2mg : Administer for PICC Line occlusion and as ordered by physician for other access device   [facility] pharmacist to adjust dose for: Vancomycin , Aminoglycosides and other anti-infective therapies as requested by physician   Method of administration may be changed at the discretion of home infusion pharmacist based upon assessment of the patient and/or caregiver's ability to self-administer the medication ordered   Complete by: As directed    Method of administration may be changed at the discretion of home infusion pharmacist based upon assessment of the patient and/or caregiver's ability to self-administer the medication ordered   Complete by: As directed    Outpatient Parenteral Antibiotic Therapy Information Antibiotic: Daptomycin (Cubicin) IVPB; Indications for use: MRSA bacteremia; End Date: 03/22/2024   Complete by: As directed    Antibiotic: Daptomycin (Cubicin) IVPB   Indications for use: MRSA bacteremia   End Date: 03/22/2024      Allergies as of 03/12/2024       Reactions   Levaquin  [levofloxacin ] Itching   Tolerated ciprofloxacin  in 2022.        Medication List     STOP taking these medications    amoxicillin -clavulanate 875-125 MG tablet Commonly known as: AUGMENTIN    daptomycin 700-0.9 MG/100ML-% Soln Commonly known as: CUBICIN       TAKE these medications     acetaminophen  325 MG tablet Commonly known as: TYLENOL  Take 1-2 tablets (325-650 mg total) by mouth every 4 (four) hours as needed for mild pain (pain score 1-3).   albuterol  108 (90 Base) MCG/ACT inhaler Commonly known as: VENTOLIN  HFA INHALE 2 PUFFS BY MOUTH EVERY 4 HOURS AS NEEDED FOR WHEEZE OR FOR SHORTNESS OF BREATH  apixaban  5 MG Tabs tablet Commonly known as: Eliquis  Take 1 tablet (5 mg total) by mouth 2 (two) times daily.   atorvastatin  20 MG tablet Commonly known as: LIPITOR Take 1 tablet (20 mg total) by mouth daily.   daptomycin IVPB Commonly known as: CUBICIN Inject 700 mg into the vein daily for 11 days. Indication:  MRSA bacteremia First Dose: No Last Day of Therapy:  03/22/24 Labs - Once weekly:  CBC/D, BMP, and CPK Labs - Once weekly: ESR and CRP Method of administration: IV Push Method of administration may be changed at the discretion of home infusion pharmacist based upon assessment of the patient and/or caregiver's ability to self-administer the medication ordered.   diltiazem  180 MG 24 hr capsule Commonly known as: Cardizem  CD Take 1 capsule (180 mg total) by mouth daily.   escitalopram 10 MG tablet Commonly known as: LEXAPRO Take 1 tablet (10 mg total) by mouth daily.   ferrous gluconate  324 MG tablet Commonly known as: FERGON Take 1 tablet (324 mg total) by mouth daily.   Gerhardt's butt cream Crea Apply 1 Application topically 3 (three) times daily. Notes to patient: Use a mix of desitin and nystatin.   hydrocerin Crea Apply 1 Application topically 2 (two) times daily.   ipratropium-albuterol  0.5-2.5 (3) MG/3ML Soln Commonly known as: DUONEB 1 NEBULE EVERY 6 HOURS AS NEEDED. **J45.3**   melatonin 5 MG Tabs Take 1 tablet (5 mg total) by mouth at bedtime.   multivitamin with minerals Tabs tablet Take 1 tablet by mouth daily.   nutrition supplement (JUVEN) Pack Take 1 packet by mouth 2 (two) times daily between meals.   oxyCODONE  5  MG immediate release tablet--Rx # 30 pills Commonly known as: Oxy IR/ROXICODONE  Take 1 tablet (5 mg total) by mouth every 4 (four) hours as needed for severe pain (pain score 7-10) or moderate pain (pain score 4-6). Notes to patient: Limit to one pill per day as needed for severe pain   pantoprazole  40 MG tablet Commonly known as: PROTONIX  Take 1 tablet (40 mg total) by mouth 2 (two) times daily before a meal.   polyethylene glycol 17 g packet Commonly known as: MIRALAX  / GLYCOLAX  Take 17 g by mouth 2 (two) times daily.   potassium chloride  10 MEQ tablet Commonly known as: KLOR-CON  M Take 1 tablet (10 mEq total) by mouth daily.   predniSONE  5 MG tablet Commonly known as: DELTASONE  Take 4 tablets (20 mg total) by mouth daily with breakfast. 3 tabs daily   pregabalin  150 MG capsule Commonly known as: Lyrica  Take 1 capsule (150 mg total) by mouth 2 (two) times daily. What changed:  how much to take when to take this additional instructions   senna 8.6 MG Tabs tablet Commonly known as: SENOKOT Take 2 tablets (17.2 mg total) by mouth daily.   Striverdi Respimat 2.5 MCG/ACT Aers Generic drug: Olodaterol HCl INHALE 2 PUFFS BY MOUTH INTO THE LUNGS DAILY Notes to patient: Resume at home   sulfamethoxazole -trimethoprim  400-80 MG tablet Commonly known as: BACTRIM  Take 1 tablet by mouth daily. Notes to patient: Recommended to continue this at home to prevent pneumonia due to being on steriods   tamsulosin  0.4 MG Caps capsule Commonly known as: FLOMAX  Take 1 capsule (0.4 mg total) by mouth at bedtime.               Discharge Care Instructions  (From admission, onward)           Start  Ordered   03/12/24 0000  Change dressing on IV access line weekly and PRN  (Home infusion instructions - [facility] )        03/12/24 1052   03/11/24 0000  Change dressing on IV access line weekly and PRN  (Home infusion instructions - [facility] )         03/11/24 1243            Follow-up Information     Jerrell Cleatus Ned, MD. Call on 03/19/2024.   Specialty: Internal Medicine Why: Appointment at 10:40 am Contact information: [address] [place] [address] [phone]         Urbano Albright, MD Follow up.   Specialty: Physical Medicine and Rehabilitation Why: office will call you with follow up appointment Contact information: [address] [place] [address] [phone]         Overton Constance DASEN, MD Follow up on 03/30/2024.   Specialty: Infectious Diseases Why: Appointment at 1:45 pm Contact information: [address] [place] [address] [phone]                 Signed: Sharlet GORMAN Schmitz 03/17/2024, 10:32 PM

## 2024-03-15 ENCOUNTER — Telehealth: Payer: Self-pay | Admitting: *Deleted

## 2024-03-15 NOTE — Telephone Encounter (Unsigned)
 Copied from CRM [id number]. Topic: Appointments - Scheduling Inquiry for Clinic >> Mar 15, 2024  8:34 AM Antwanette L wrote: Reason for CRM: The patient was discharged from [facility] on 10/17 with a diagnosis of MERS. They are currently scheduled for a Transition of Care (TOC) appointment with Benton Gave on 11/14, but are requesting an earlier appointment due to the nature of their recent hospitalization. Note: Whitney Crain's next available appointment is 11/14.Please contact the patient's wife, [name] at [phone] to discuss scheduling an earlier appointment if possible.

## 2024-03-15 NOTE — Telephone Encounter (Signed)
 Not a patient at our office, [facility].

## 2024-03-15 NOTE — Telephone Encounter (Signed)
 Patient got scheduled for Whitney tomorrow at 1pm.

## 2024-03-15 NOTE — Telephone Encounter (Signed)
 Copied from CRM #[id number]. Topic: Clinical - Home Health Verbal Orders >> Mar 15, 2024  1:08 PM Farrel B wrote: Caller/Agency: Sharlet Cleaves of [name]  Callback Number: [phone] Service Requested: Skilled Nursing Frequency: 1 week 4 and 2 prn visit  Any new concerns about the patient? Yes need orders if the provider is needing them to pull the pic, also the need to know if labs need to be drawn, IV is finished on 03/22/24,patient was entered into the hospital due to be septic. Ms. Cleaves is requesting a call back to go over the needs that are requested. Please see number above

## 2024-03-15 NOTE — Progress Notes (Signed)
 Inpatient Rehabilitation Discharge Medication Review by a Pharmacist   A complete drug regimen review was completed for this patient to identify any potential clinically significant medication issues.   High Risk Drug Classes Is patient taking? Indication by Medication  Antipsychotic No   Anticoagulant Yes Apixaban -afib  Antibiotic Yes Daptomycin-MRSA bacteremia (stop date 10/27) , Bactrim - PJP ppx   Opioid Yes Oxycodone -pain  Antiplatelet No    Hypoglycemics/insulin No    Vasoactive Medication Yes Diltiazem -afib Flomax -PH  Chemotherapy No    Other Yes Lexapro- mood Melatonin- insomnia Senokot- constipation  Pantoprazole - reflux  Lyrica - pain adjunct Ferrous Gluconate -supplmentation Atorvastatin -HLD Olodaterol, Duoneb, prednisone , albuterol --COPD     Type of Medication Issue Identified Description of Issue Recommendation(s)  Drug Interaction(s) (clinically significant)        Duplicate Therapy        Allergy         No Medication Administration End Date      Incorrect Dose        Additional Drug Therapy Needed        Significant med changes from prior encounter (inform family/care partners about these prior to discharge).    Other            Clinically significant medication issues were identified that warrant physician communication and completion of prescribed/recommended actions by midnight of the next day:  No   Name of provider notified for urgent issues identified:    Provider Method of Notification:        Pharmacist comments:    Time spent performing this drug regimen review (minutes):  20     Massie Fila, PharmD Clinical Pharmacist  03/15/2024 8:56 AM

## 2024-03-16 ENCOUNTER — Ambulatory Visit: Admitting: Urgent Care

## 2024-03-16 ENCOUNTER — Encounter: Payer: Self-pay | Admitting: Urgent Care

## 2024-03-16 VITALS — BP 119/74 | HR 78

## 2024-03-16 DIAGNOSIS — M19011 Primary osteoarthritis, right shoulder: Secondary | ICD-10-CM | POA: Diagnosis not present

## 2024-03-16 DIAGNOSIS — Z789 Other specified health status: Secondary | ICD-10-CM | POA: Diagnosis not present

## 2024-03-16 DIAGNOSIS — R634 Abnormal weight loss: Secondary | ICD-10-CM | POA: Diagnosis not present

## 2024-03-16 DIAGNOSIS — G6289 Other specified polyneuropathies: Secondary | ICD-10-CM

## 2024-03-16 DIAGNOSIS — B9562 Methicillin resistant Staphylococcus aureus infection as the cause of diseases classified elsewhere: Secondary | ICD-10-CM

## 2024-03-16 DIAGNOSIS — D649 Anemia, unspecified: Secondary | ICD-10-CM | POA: Diagnosis not present

## 2024-03-16 DIAGNOSIS — R7881 Bacteremia: Secondary | ICD-10-CM

## 2024-03-16 DIAGNOSIS — L98491 Non-pressure chronic ulcer of skin of other sites limited to breakdown of skin: Secondary | ICD-10-CM

## 2024-03-16 NOTE — Progress Notes (Signed)
 Established Patient Office Visit  Subjective:  Patient ID: Levi [name], male    DOB: May 03, 1946  Age: 78 y.o. MRN: [id number]  Chief Complaint  Patient presents with   Hospitalization Follow-up    Still very weak    HPI  Discussed the use of AI scribe software for clinical note transcription with the patient, who gave verbal consent to proceed.  History of Present Illness   Levi [name] is a 78 year old male with peripheral neuropathy who presents with worsening neuropathy. He is accompanied by his wife.  He is experiencing worsening peripheral neuropathy, with significant deterioration in his left hand, impacting daily activities such as opening a milk bottle. His right hand has been severely affected for a long time, rendering it nearly unusable. He has previously tried gabapentin  without relief and is currently on pregabalin  (225 mg daily) and Cymbalta  (30 mg daily) without noticeable improvement. He follows up with neurology every four to six months.  He was recently hospitalized for MRSA bacteremia, treated with IV daptomycin, and discharged with Bactrim . He currently has a PICC line and receives home health services for care management. His wife is concerned about a wound on his right leg below the knee, which she believes developed during his hospital stay. He wears his prosthesis only a few hours a day.  He has a history of a right below-knee amputation, heart failure, asthma, COPD, and a right shoulder rotator cuff tear. He has been on prednisone  for 15 years for his lung condition (dx unknown, believes due to [employer]), taking 20 mg daily. He also takes daptomycin and a potassium supplement (10 mg daily).  He feels weak following his recent hospitalization for sepsis, which lasted two weeks.He was admitted 10/3, discharged 10/17. His appetite is good, but he experiences changes in taste due to antibiotics. He has lost 45 pounds over the last three years. He rarely  uses oxycodone  for shoulder pain due to concerns about side effects. No significant nausea from antibiotics. No diabetes. Was scheduled for R shoulder replacement prior to being hospitalized.       Patient Active Problem List   Diagnosis Date Noted   Malnutrition of moderate degree 03/12/2024   Acute heart failure with mildly reduced ejection fraction (HFmrEF) (HCC) 03/01/2024   Leukocytosis 03/01/2024   Debility 02/27/2024   MRSA bacteremia 02/22/2024   Left lower lobe pneumonia 02/21/2024   AKI (acute kidney injury) 02/21/2024   Hypokalemia 02/21/2024   Sacral wound, initial encounter 02/20/2024   Sepsis (HCC) 03/18/2023   Primary osteoarthritis, right shoulder 07/05/2022   BPH (benign prostatic hyperplasia) 02/21/2022   Acquired absence of right leg below knee (HCC) 01/22/2022   Peripheral neuropathy 02/22/2021   Chronic systolic heart failure (HCC) 02/22/2021   COPD (chronic obstructive pulmonary disease) (HCC) 03/21/2020   GERD (gastroesophageal reflux disease) status post Nissen fundoplication 12/03/2017   Hyperlipidemia    Paroxysmal atrial fibrillation (HCC) 05/11/2012   Asthma, moderate persistent 03/06/2009   Past Medical History:  Diagnosis Date   Agent orange exposure 1970's   takes Imdur ,Diltiazem , and Atacand  daily   Agent orange exposure    ALLERGIC RHINITIS    Allergy     seasonal allergies   Anemia    iron  deficiency   Arthritis    Asthma    uses inhaler and nebulizers   Atrial fibrillation (HCC)    Bruises easily    d/t prednisone  daily   Cataract    Cellulitis 03/22/2020  Chest wall pain 09/21/2019   L scapula, reported 4/27 21/     CHF (congestive heart failure) (HCC)    dx-on meds to treat   Chronic bronchitis (HCC)    used to get it q yr; last time ?2008 (04/28/2012)   Clotting disorder 2019   On blood thinner when I bleed it is for a consideral amount of time. This is tegardless of size of wound.   COPD (chronic obstructive pulmonary  disease) (HCC)    agent orange exposure   Degenerative disk disease    everywhere (04/28/2012)   Depression due to physical illness 12/15/2023   Diverticulitis    Dysrhythmia    afib   Elevated uric acid in blood    takes Allopurinol  daily   Emphysema of lung (HCC)    Enlarged prostate    but not on any meds   Essential (primary) hypertension 11/15/2020   GERD (gastroesophageal reflux disease)    takes Protonix  daily   H/O hiatal hernia    Headache    History of colonic polyps 02/09/2013   01/2013 - 3 adenomas max 8 mm  01/2015 2 diminutive adenomas - repeat colon 2021     History of colonoscopy    History of pneumonia    a. 2010   History of total right hip arthroplasty 07/31/2015   Hyperlipidemia    takes Lipitor daily; pt. states he takes a preventive   Hypothyroidism    Joint pain    Joint swelling    Leukocytosis    Lumbar stenosis status post T12-S1 fusion 04/07/2017   MSSA bacteremia 03/19/2023   Nausea and vomiting 11/12/2022   Neuromuscular disorder (HCC)    bilat legs, and bilat arms   Neuropathy    Osteoarthritis of left knee 01/10/2012   Osteoporosis 2012   PAF (paroxysmal atrial fibrillation) (HCC)    a. Dx 04/2012, CHA2DS2VASc = 1 (age);  b. 04/2012 Echo: EF 40-50%, mild MR.   Personal history of colonic adenomas 02/09/2013   Pneumonia    Primary osteoarthritis of right knee 06/17/2014   Sepsis (HCC) 03/18/2023   Shortness of breath dyspnea    Thyroid  disease    no taking meds-caused dizziness   Past Surgical History:  Procedure Laterality Date   50 HOUR PH STUDY N/A 05/30/2021   Procedure: 24 HOUR PH STUDY;  Surgeon: San Sandor GAILS, DO;  Location: [facility];  Service: Gastroenterology;  Laterality: N/A;   AMPUTATION Right 11/14/2023   Procedure: AMPUTATION BELOW KNEE;  Surgeon: Harden Jerona GAILS, MD;  Location: [facility];  Service: Orthopedics;  Laterality: Right;   AMPUTATION TOE Bilateral 08/13/2023   Procedure: LEFT SECOND TOE AMPUTATION AND  RIGHT FIFTH TOE AMPUTATION;  Surgeon: Malvin Marsa FALCON, DPM;  [facility]: [facility];  Service: Orthopedics/Podiatry;  Laterality: Bilateral;   ANTERIOR CERVICAL DECOMP/DISCECTOMY FUSION N/A 07/21/2012   Procedure: ANTERIOR CERVICAL DECOMPRESSION/DISCECTOMY FUSION 2 LEVELS;  Surgeon: Victory Gens, MD;  [facility];  Service: Neurosurgery;  Laterality: N/A;  C4-5 C5-6 Anterior cervical decompression/diskectomy/fusion   ANTERIOR LAT LUMBAR FUSION N/A 04/07/2017   Procedure: Thoracic twelve-Lumbar one Anterolateral decompression/fusion;  Surgeon: Gens Victory, MD;  Location: [facility];  Service: Neurosurgery;  Laterality: N/A;   ARTHRODESIS FOOT WITH WEIL OSTEOTOMY Left 11/07/2022   Procedure: LEFT THIRD METATARSOPHALANGEAL CAPSULOTOMY, WEIL OSTEOTOMY;  Surgeon: Elsa Lonni SAUNDERS, MD;  Location: [facility] OR;  Service: Orthopedics;  Laterality: Left;  LENGTH OF SURGERY: 60 MINUTES   BACK SURGERY     x 3  BELOW KNEE LEG AMPUTATION Right    June 2025   BONE EXOSTOSIS EXCISION Right 10/16/2023   Procedure: EXCISION, EXOSTOSIS;  Surgeon: Janit Thresa HERO, DPM;  [facility];  Service: Orthopedics/Podiatry;  Laterality: Right;   CARDIAC CATHETERIZATION  11/2008   Dr. Jeffrie - 20% calcified non flow limiting left main, 50% EF apical hypokinesis   CARPAL TUNNEL RELEASE Bilateral    CERVICAL DISC ARTHROPLASTY N/A 04/07/2017   Procedure: Cervical six-seven Disc arthroplasty;  Surgeon: Colon Shove, MD;  Location: [facility] OR;  Service: Neurosurgery;  Laterality: N/A;   CHEST TUBE INSERTION Left 04/11/2017   Procedure: CHEST TUBE INSERTION;  Surgeon: Fleeta Hanford Coy, MD;  Location: [facility] OR;  Service: Thoracic;  Laterality: Left;   CHOLECYSTECTOMY     COLONOSCOPY  2016   CG-MAC-miralax (good)servere TICS/TA x 2   ESOPHAGEAL MANOMETRY N/A 05/30/2021   Procedure: ESOPHAGEAL MANOMETRY (EM);  Surgeon: San Sandor GAILS, DO;  [facility]: [facility];  Service: Gastroenterology;  Laterality: N/A;  ph impedence    ESOPHAGOGASTRODUODENOSCOPY     EYE SURGERY Bilateral 2011   steroidal encapsulation (04/28/2012)   FOOT SURGERY Right    x 2   JOINT REPLACEMENT     Many surgeries including 5 joit replacements   KNEE ARTHROSCOPY Bilateral    LATERAL / POSTERIOR COMBINED FUSION LUMBAR SPINE  2012   LEFT ATRIAL APPENDAGE OCCLUSION N/A 02/22/2021   Procedure: LEFT ATRIAL APPENDAGE OCCLUSION;  Surgeon: Cindie Ole DASEN, MD;  [facility];  Service: Cardiovascular;  Laterality: N/A;   NEUROPLASTY / TRANSPOSITION ULNAR NERVE AT ELBOW Bilateral    POLYPECTOMY  2016   severe TICS/TA x 2   POSTERIOR CERVICAL LAMINECTOMY WITH MET- RX Right 06/11/2021   Procedure: Right Cervical six-seven  Laminectomy and foraminotomy with metrex;  Surgeon: Colon Shove, MD;  [facility];  Service: Neurosurgery;  Laterality: Right;   REFRACTIVE SURGERY Left 07/07/2023   laser surgery but still has floaters   REVERSE SHOULDER ARTHROPLASTY  04/28/2012   Procedure: REVERSE SHOULDER ARTHROPLASTY;  Surgeon: Eva Elsie Herring, MD;  Location[facility];  Service: Orthopedics;  Laterality: Left;  Left shouder reverse total shoulder arthroplasty   SHOULDER SURGERY     SPINE SURGERY     I have had 7 spinal surgeries   TEE WITHOUT CARDIOVERSION N/A 02/22/2021   Procedure: TRANSESOPHAGEAL ECHOCARDIOGRAM (TEE);  Surgeon: Cindie Ole DASEN, MD;  [facility] INVASIVE CV LAB;  Service: Cardiovascular;  Laterality: N/A;   TENDON REPAIR Right 08/07/2020   Procedure: RIGHT HAND LIGAMENT RECONSTRUCTION AND TENDON INTERPOSITION;  Surgeon: Yvone Rush, MD;  Location: [facility];  Service: Orthopedics;  Laterality: Right;   TONSILLECTOMY AND ADENOIDECTOMY  1954   TOTAL HIP ARTHROPLASTY Left 2011   left (04/28/2012)   TOTAL HIP ARTHROPLASTY Right 07/31/2015   Procedure: TOTAL HIP ARTHROPLASTY ANTERIOR APPROACH;  Surgeon: Rush Yvone, MD;  [facility];  Service: Orthopedics;  Laterality: Right;    TOTAL KNEE ARTHROPLASTY  01/10/2012   Procedure: TOTAL KNEE ARTHROPLASTY;  Surgeon: Rush LITTIE Yvone, MD;  [facility];  Service: Orthopedics;;  left total knee arthroplasty   TOTAL KNEE ARTHROPLASTY Right 06/17/2014   Procedure: TOTAL KNEE ARTHROPLASTY;  Surgeon: Rush LITTIE Yvone, MD;  [facility];  Service: Orthopedics;  Laterality: Right;   toupet fundolplication  11/01/2021   TRANSESOPHAGEAL ECHOCARDIOGRAM (CATH LAB) N/A 03/24/2023   Procedure: TRANSESOPHAGEAL ECHOCARDIOGRAM;  Surgeon: Mona Vinie BROCKS, MD;  [facility];  Service: Cardiovascular;  Laterality: N/A;   TRANSESOPHAGEAL ECHOCARDIOGRAM (CATH LAB) N/A 02/25/2024   Procedure: TRANSESOPHAGEAL ECHOCARDIOGRAM;  Surgeon: Jeffrie Oneil BROCKS, MD;  [facility];  Service: Cardiovascular;  Laterality: N/A;   Social History   Tobacco Use   Smoking status: Never   Smokeless tobacco: Never  Vaping Use   Vaping status: Never Used  Substance Use Topics   Alcohol use: No   Drug use: No      ROS: as noted in HPI  Objective:     BP 119/74   Pulse 78   SpO2 97%  BP Readings from Last 3 Encounters:  03/16/24 119/74  03/12/24 132/66  02/28/24 (!) 148/77   Wt Readings from Last 3 Encounters:  03/12/24 148 lb (67.1 kg)  02/21/24 190 lb (86.2 kg)  01/15/24 185 lb (83.9 kg)      Physical Exam Vitals and nursing note reviewed. Exam conducted with a chaperone present.  Constitutional:      General: He is not in acute distress.    Appearance: Normal appearance. He is not ill-appearing, toxic-appearing or diaphoretic.  HENT:     Head: Normocephalic and atraumatic.     Right Ear: Tympanic membrane, ear canal and external ear normal. There is no impacted cerumen.     Left Ear: Tympanic membrane, ear canal and external ear normal. There is no impacted cerumen.     Nose: Nose normal.     Mouth/Throat:     Mouth: Mucous membranes are moist.     Pharynx: Oropharynx is clear. No oropharyngeal exudate or  posterior oropharyngeal erythema.  Eyes:     General: No scleral icterus.       Right eye: No discharge.        Left eye: No discharge.     Extraocular Movements: Extraocular movements intact.     Pupils: Pupils are equal, round, and reactive to light.  Neck:     Thyroid : No thyroid  mass, thyromegaly or thyroid  tenderness.  Cardiovascular:     Rate and Rhythm: Normal rate and regular rhythm.     Pulses: Normal pulses.     Heart sounds: No murmur heard. Pulmonary:     Effort: Pulmonary effort is normal. No respiratory distress.     Breath sounds: Normal breath sounds. No stridor. No wheezing or rhonchi.  Abdominal:     General: Abdomen is flat. Bowel sounds are normal. There is no distension.     Palpations: Abdomen is soft. There is no mass.     Tenderness: There is no abdominal tenderness. There is no guarding.  Musculoskeletal:        General: Deformity present.     Cervical back: Normal range of motion and neck supple. No rigidity or tenderness.       Legs:  Lymphadenopathy:     Cervical: No cervical adenopathy.  Skin:    General: Skin is warm and dry.     Coloration: Skin is not jaundiced.     Findings: Lesion (superficial ulceration to R anterior lower leg halfway between amputation site and patella) present. No bruising, erythema or rash.  Neurological:     General: No focal deficit present.     Mental Status: He is alert and oriented to person, place, and time.     Sensory: No sensory deficit.     Motor: No weakness.  Psychiatric:        Mood and Affect: Mood normal.        Behavior: Behavior normal.  Laceration repair  Date/Time: 03/16/2024 1:45 PM  Performed by: [name] CROME, PA Authorized by: [name] CROME, PA   Consent:    Consent obtained:  Verbal   Consent given by:  Patient Post-procedure details:    Dressing:  Sterile dressing and non-adherent dressing Comments:     R shin ulceration dressed with xeroform, telfa and coban    No  results found for any visits on 03/16/24.  Last CBC Lab Results  Component Value Date   WBC 9.3 03/11/2024   HGB 9.1 (L) 03/11/2024   HCT 30.0 (L) 03/11/2024   MCV 91.7 03/11/2024   MCH 27.8 03/11/2024   RDW 18.0 (H) 03/11/2024   PLT 198 03/11/2024   Last metabolic panel Lab Results  Component Value Date   GLUCOSE 97 03/12/2024   NA 143 03/12/2024   K 3.8 03/12/2024   CL 106 03/12/2024   CO2 26 03/12/2024   BUN 28 (H) 03/12/2024   CREATININE 0.79 03/12/2024   GFRNONAA >60 03/12/2024   CALCIUM  8.6 (L) 03/12/2024   PHOS 3.4 12/28/2023   PROT 4.7 (L) 03/08/2024   ALBUMIN 2.2 (L) 03/08/2024   LABGLOB 1.9 01/01/2024   AGRATIO 1.6 12/03/2019   BILITOT 0.5 03/08/2024   ALKPHOS 72 03/08/2024   AST 17 03/08/2024   ALT 16 03/08/2024   ANIONGAP 11 03/12/2024   Last lipids Lab Results  Component Value Date   CHOL 208 (H) 07/05/2022   HDL 112 07/05/2022   LDLCALC 82 07/05/2022   TRIG 65 07/05/2022   CHOLHDL 1.9 07/05/2022   Last hemoglobin A1c Lab Results  Component Value Date   HGBA1C 5.8 (H) 07/05/2022   Last thyroid  functions Lab Results  Component Value Date   TSH 1.70 07/05/2022   Last vitamin D No results found for: 25OHVITD2, 25OHVITD3, VD25OH Last vitamin B12 and Folate Lab Results  Component Value Date   VITAMINB12 560 01/01/2024   FOLATE 12.2 01/01/2024      The ASCVD Risk score (Arnett DK, et al., 2019) failed to calculate for the following reasons:   The valid HDL cholesterol range is 20 to 100 mg/dL  Assessment & Plan:  Transition of care  MRSA bacteremia  Primary osteoarthritis, right shoulder  Other polyneuropathy -     Ambulatory referral to Pain Clinic  Skin ulcer, limited to breakdown of skin (HCC) -     Laceration repair  Anemia, unspecified type  Weight loss  Assessment and Plan    Peripheral neuropathy Peripheral neuropathy with worsening symptoms, particularly in the left hand, affecting daily activities. Current  medications include pregabalin  and Cymbalta  with no significant improvement. Neurology follow-up scheduled for December. - Refer to pain specialist for neuropathy. - Discuss TENS unit or other non-medication treatments. - Consider spreading pregabalin  dosage throughout the day.  Right leg wound (post-below knee amputation) Wound on right leg below knee managed with bandages and antiseptic. No hx of DM. - Provide Xeroform for wound care.  Chronic right shoulder rotator cuff tear Chronic right shoulder rotator cuff tear causing significant pain. Surgery delayed due to recent hospitalizations. Pain managed with oxycodone  as needed. - Contact Doctor Addie to discuss shoulder management. - Encourage oxycodone  for severe pain, manage constipation if needed.  Chronic obstructive pulmonary disease (COPD) on long-term corticosteroid therapy COPD managed with long-term prednisone  at 20 mg daily. Attempts to reduce dosage led to adrenal insufficiency symptoms.  Recent sepsis, now resolved Recent sepsis resolving. Discharged with improved labs. Experiencing post-sepsis fatigue and weakness. -  Ensure home health services for continued care and monitoring. - Encourage physical and occupational therapy for strength and mobility.  PICC line management PICC line in place for medication administration post-hospitalization. - Ensure home health RN manage PICC line care.   Anemia Noted while inpatient, likely reactive. He is on iron  replacement. He is also on eliquis . Will monitor CBC for continued trends.  Follow up in two weeks for repeat labs.         Return in about 2 weeks (around 03/30/2024).   Benton LITTIE Gave, PA

## 2024-03-16 NOTE — Patient Instructions (Signed)
 I have placed a referral to [facility]. They specialize in treatment options for peripheral neuropathy, including spinal stimulators.  Please call them at the address below to schedule:  [facility] [address], [place], [place] [address] Phone: [phone]  You may increase the frequency of gabapentin  dosing.  For the right shin, please purchase xeroform guaze pads. Apply xeroform over the area and change dressing twice daily. Put a telfa pad over the xeroform and wrap loosly with coban. Leave open at night.  Have home health nursing assist with wound care and PICC Line care. Home health PT and OT to assist with strength and mobility.  Please return in 2 weeks for follow up and labs, return sooner if any acute changes.

## 2024-03-18 ENCOUNTER — Telehealth: Payer: Self-pay

## 2024-03-18 ENCOUNTER — Telehealth: Payer: Self-pay | Admitting: *Deleted

## 2024-03-18 DIAGNOSIS — R7881 Bacteremia: Secondary | ICD-10-CM

## 2024-03-18 DIAGNOSIS — D649 Anemia, unspecified: Secondary | ICD-10-CM

## 2024-03-18 DIAGNOSIS — G6289 Other specified polyneuropathies: Secondary | ICD-10-CM

## 2024-03-18 DIAGNOSIS — R634 Abnormal weight loss: Secondary | ICD-10-CM

## 2024-03-18 DIAGNOSIS — L98491 Non-pressure chronic ulcer of skin of other sites limited to breakdown of skin: Secondary | ICD-10-CM

## 2024-03-18 NOTE — Telephone Encounter (Signed)
 Copied from CRM [id number]. Topic: Clinical - Home Health Verbal Orders >> Mar 18, 2024  8:25 AM Miquel SAILOR wrote: Caller/Agency: Deitra from [facility]  Callback Number: [phone] Service Requested: Skilled Nursing Frequency: 4 1 week IV and antibiotics/Pull pic line when orders done   Any new concerns about the patient? No

## 2024-03-18 NOTE — Telephone Encounter (Signed)
 Pt was supposed to have OT, PT for his weakness. Home health nurse to help with PICC line and with wound care to his R lower leg. He needs to follow up with ID for PICC line removal

## 2024-03-18 NOTE — Telephone Encounter (Signed)
 Called Levi [name] with [facility] - informed Dr Jerrell is no longer at this practice.

## 2024-03-18 NOTE — Telephone Encounter (Signed)
 Copied from CRM #[id number]. Topic: Clinical - Home Health Verbal Orders >> Mar 15, 2024  1:08 PM Farrel B wrote: Caller/Agency: Sharlet Cleaves of [name]  Callback Number: [phone] Service Requested: Skilled Nursing Frequency: 1 week 4 and 2 prn visit  Any new concerns about the patient? Yes need orders if the provider is needing them to pull the pic, also the need to know if labs need to be drawn, IV is finished on 03/22/24,patient was entered into the hospital due to be septic. Ms. Cleaves is requesting a call back to go over the needs that are requested. Please see number above >> Mar 18, 2024 12:56 PM Mercer PEDLAR wrote: Holley is calling from [facility] because she has not heard back. I informed her that I will send verbal order to correct clinic because patient is not established at [facility]. Patient is currently in the process of TOC to [facility].

## 2024-03-18 NOTE — Telephone Encounter (Signed)
 Orders came to [facility] by mistake

## 2024-03-18 NOTE — Telephone Encounter (Signed)
 Spoke with Pam from [facility] and provided her with our fax number [phone] she is going to send over the original order for [employer], PA-C to review.

## 2024-03-19 ENCOUNTER — Other Ambulatory Visit: Payer: Self-pay

## 2024-03-19 ENCOUNTER — Telehealth: Payer: Self-pay

## 2024-03-19 ENCOUNTER — Ambulatory Visit: Admitting: Student in an Organized Health Care Education/Training Program

## 2024-03-19 DIAGNOSIS — R7881 Bacteremia: Secondary | ICD-10-CM

## 2024-03-19 DIAGNOSIS — D649 Anemia, unspecified: Secondary | ICD-10-CM

## 2024-03-19 DIAGNOSIS — J440 Chronic obstructive pulmonary disease with acute lower respiratory infection: Secondary | ICD-10-CM | POA: Diagnosis not present

## 2024-03-19 DIAGNOSIS — J189 Pneumonia, unspecified organism: Secondary | ICD-10-CM | POA: Diagnosis not present

## 2024-03-19 DIAGNOSIS — L98491 Non-pressure chronic ulcer of skin of other sites limited to breakdown of skin: Secondary | ICD-10-CM

## 2024-03-19 DIAGNOSIS — G6289 Other specified polyneuropathies: Secondary | ICD-10-CM

## 2024-03-19 DIAGNOSIS — R634 Abnormal weight loss: Secondary | ICD-10-CM

## 2024-03-19 NOTE — Telephone Encounter (Signed)
 I called Levi [name] from [place] back. She was in the field and was unable to talk. She gave me a number for [name] to speak with [place]. I called and was transferred. The phone just rang without an answer.   [name] number [phone]

## 2024-03-19 NOTE — Telephone Encounter (Signed)
 I called and left a message for a return call. I will call back in 30 minutes.

## 2024-03-19 NOTE — Telephone Encounter (Signed)
 Thank you. I assume they will send me the results of the labs?

## 2024-03-19 NOTE — Telephone Encounter (Signed)
 Yes, alginate dressing is acceptable. Thank you

## 2024-03-19 NOTE — Telephone Encounter (Signed)
 I spoke with his spouse and she states she can bring him in for labs if needed.

## 2024-03-19 NOTE — Telephone Encounter (Signed)
 FYI

## 2024-03-19 NOTE — Telephone Encounter (Signed)
 Sharlet from [facility] would like to know if the dressing the the wound on his right shin could be done with alginate dressing twice weekly?  I spoke with [facility] nurse and gave verbal instructions. She will have someone call be back to let me know if they can draw the labs today. If not I will call patient to come in to have labs drawn.

## 2024-03-19 NOTE — Telephone Encounter (Signed)
 I don't think I will receive a call back today. I have pended the labs for review and sign. I can call the patient to come in for labs before it is too late.

## 2024-03-19 NOTE — Telephone Encounter (Signed)
 This appears to be a repeat note/ this was addressed on a separate encounter.

## 2024-03-19 NOTE — Telephone Encounter (Signed)
 See other encounter. I spoke with his spouse today and [name].

## 2024-03-19 NOTE — Telephone Encounter (Signed)
 Levi [name], Home health had been calling with several questions. Unfortunately this complex patient was new to me a few days ago and the discharge instructions from the hospital had not been signed by the hospitalist until yesterday. Therefore, at the time of his follow up, specific post-discharge instructions were unavailable.  Could you please notify home health that: A nurse should be dressing the wound on his R shin Nurse should be assessing PICC line and changing dressing Daptomycin is done on 03/22/24. PICC Line can be removed after last dose. Pt is to have a follow up with infectious disease on 03/30/24 Labs are supposed to be done once weekly - today is the 7 day mark post discharge. He does need to have BMP, CBC, CPK, ESR And CRP labs. Can home health draw labs?? If not, he needs to come in today for these labs. PT and OT should be working with patient due to sepsis/ asthenia/ weakness  I hope that answers all their questions. Let me know if I need to place lab order and if pt can come in today.

## 2024-03-19 NOTE — Telephone Encounter (Signed)
 Copied from CRM [id number]. Topic: Clinical - Medication Question >> Mar 19, 2024 10:16 AM Kevelyn M wrote: Reason for CRM: Saddie with [name] is calling in because the patient's wife is very upset because no verbal order has been made in regards to skilled nursing for her husband. Today he is due for a pic line check. Please advise, patient needs to be seen today. [name] wants to get someone to see him today.  Call back #[phone]

## 2024-03-19 NOTE — Telephone Encounter (Addendum)
 I finally spoke with [place] at [facility] and she states a nurse was able to go out today.   I called and spoke with his spouse and she confirmed they came out and did the labs.

## 2024-03-19 NOTE — Telephone Encounter (Signed)
 Copied from CRM [id number]. Topic: General - Other >> Mar 19, 2024  2:28 PM Rea ORN wrote: Reason for CRM: Signe, OT with [facility], called to advise that he will not see the pt today. Pt was seen by physical therapy and nursing today and did not feel up to seeing OT. Signe will see the pt on Monday.

## 2024-03-21 ENCOUNTER — Other Ambulatory Visit: Payer: Self-pay | Admitting: Internal Medicine

## 2024-03-22 ENCOUNTER — Telehealth: Payer: Self-pay

## 2024-03-22 LAB — LAB REPORT - SCANNED: EGFR: 95

## 2024-03-22 NOTE — Telephone Encounter (Signed)
 They are going to refax the results

## 2024-03-22 NOTE — Telephone Encounter (Signed)
 Order placed today for Rollator via [employer]

## 2024-03-22 NOTE — Telephone Encounter (Signed)
 OK refill Striverdi Respimat per Dr. Neysa.  Per chart note: Return in about 6 months (around 06/19/2024).   OK refill x 3.

## 2024-03-22 NOTE — Telephone Encounter (Signed)
 Verbal orders for continued Occ therapy given to  Jim with [name].

## 2024-03-22 NOTE — Telephone Encounter (Signed)
 This is verbal order for OT. Okay for rolling walker as well. Let me know if I need to do anything other than a verbal okay

## 2024-03-22 NOTE — Telephone Encounter (Signed)
 Copied from CRM [id number]. Topic: Clinical - Home Health Verbal Orders >> Mar 22, 2024 12:31 PM Emylou G wrote: Caller/Agency: Jim w/[name]  Callback Number: [phone] secured line Service Requested: Occupational Therapy Frequency: Needs order for rolling walker okay to fax to [employer][email or web address] for Occ therapy - to continue 2w1 and 1w1 Any new concerns about the patient? No

## 2024-03-22 NOTE — Telephone Encounter (Signed)
 Orders received, signed and faxed back with confirmation pages obtained.

## 2024-03-23 ENCOUNTER — Telehealth: Payer: Self-pay

## 2024-03-23 NOTE — Telephone Encounter (Signed)
 Spoke with Kara at [facility]. Gave an alternate phone #  [phone] to re-fax these lab results to our office.

## 2024-03-23 NOTE — Telephone Encounter (Signed)
 I am still awaiting lab results... thanks

## 2024-03-23 NOTE — Telephone Encounter (Signed)
 Copied from CRM [id number]. Topic: Clinical - Order For Equipment >> Mar 22, 2024  4:00 PM Amy B wrote: Reason for CRM: Patient's wife states Rollator was ordered.  The therapist actually asked for a rolling walker.  She requests an order to be placed for a rolling walker.  Please call wife at [phone]

## 2024-03-23 NOTE — Telephone Encounter (Signed)
 LABS reviewed. Stable/ improved from previous. Will repeat labs at follow upon 11/3

## 2024-03-23 NOTE — Telephone Encounter (Signed)
 Patient wife , [name] informed of results and need for repeat lab work on 03/29/2024

## 2024-03-23 NOTE — Telephone Encounter (Signed)
 Order has been changed to a wheeled walker in [place].

## 2024-03-23 NOTE — Telephone Encounter (Signed)
 Lab orders received and placed in Levi [name] in basket for review.

## 2024-03-24 ENCOUNTER — Telehealth: Payer: Self-pay

## 2024-03-24 NOTE — Telephone Encounter (Signed)
 Message from Bobbette Ruth regarding patient   Hey patient still has pic but the nurse from home health needs verbal order that they can remove it  O.k. to  give verbal orders for [facility] line removal?

## 2024-03-24 NOTE — Telephone Encounter (Signed)
 Copied from CRM [id number]. Topic: Clinical - Medication Question >> Mar 24, 2024  9:14 AM Charlet HERO wrote: Reason for CRM: [facility] nurse is calling bc the patient is done with the antibiotics she is stating that the patient has taken all of it the med and there are no more orders for iv antibiotics, she wants to know what she needs to do she wants to know if the pic should be taken out and if she still needs to draw blood weekly bc the pic is no longer there and she is stating that she was told to pull the pic per Whitney when it was done to take out the pic but she wants to be sure.[phone]

## 2024-03-24 NOTE — Telephone Encounter (Signed)
 PICC can be removed. Pt has follow up with me on 03/29/24 and we will draw labs then.

## 2024-03-24 NOTE — Telephone Encounter (Signed)
 Yes. This can be removed.

## 2024-03-24 NOTE — Telephone Encounter (Signed)
 Nat  informed and asked if we wanted to draw blood before the PICC line is removed? Whitney was not in office at time - called whitney on home # - she states not additional blood work needed as this was just drawn on10/17/2025 and has shown improvement.

## 2024-03-24 NOTE — Telephone Encounter (Signed)
 Home health nurse informed in separate message.

## 2024-03-25 DIAGNOSIS — G7001 Myasthenia gravis with (acute) exacerbation: Secondary | ICD-10-CM | POA: Diagnosis not present

## 2024-03-25 DIAGNOSIS — G5603 Carpal tunnel syndrome, bilateral upper limbs: Secondary | ICD-10-CM | POA: Diagnosis not present

## 2024-03-25 DIAGNOSIS — G3184 Mild cognitive impairment, so stated: Secondary | ICD-10-CM | POA: Diagnosis not present

## 2024-03-25 DIAGNOSIS — G7080 Lambert-Eaton syndrome, unspecified: Secondary | ICD-10-CM | POA: Diagnosis not present

## 2024-03-25 DIAGNOSIS — M5412 Radiculopathy, cervical region: Secondary | ICD-10-CM | POA: Diagnosis not present

## 2024-03-25 DIAGNOSIS — G9349 Other encephalopathy: Secondary | ICD-10-CM | POA: Diagnosis not present

## 2024-03-25 DIAGNOSIS — G629 Polyneuropathy, unspecified: Secondary | ICD-10-CM | POA: Diagnosis not present

## 2024-03-25 DIAGNOSIS — M5417 Radiculopathy, lumbosacral region: Secondary | ICD-10-CM | POA: Diagnosis not present

## 2024-03-25 DIAGNOSIS — F445 Conversion disorder with seizures or convulsions: Secondary | ICD-10-CM | POA: Diagnosis not present

## 2024-03-29 ENCOUNTER — Encounter: Payer: Self-pay | Admitting: Radiology

## 2024-03-29 ENCOUNTER — Ambulatory Visit (INDEPENDENT_AMBULATORY_CARE_PROVIDER_SITE_OTHER): Admitting: Urgent Care

## 2024-03-29 ENCOUNTER — Encounter: Payer: Self-pay | Admitting: Urgent Care

## 2024-03-29 VITALS — BP 107/70 | HR 90

## 2024-03-29 DIAGNOSIS — R7881 Bacteremia: Secondary | ICD-10-CM

## 2024-03-29 DIAGNOSIS — B9562 Methicillin resistant Staphylococcus aureus infection as the cause of diseases classified elsewhere: Secondary | ICD-10-CM

## 2024-03-29 DIAGNOSIS — F0631 Mood disorder due to known physiological condition with depressive features: Secondary | ICD-10-CM

## 2024-03-29 DIAGNOSIS — E875 Hyperkalemia: Secondary | ICD-10-CM | POA: Diagnosis not present

## 2024-03-29 DIAGNOSIS — Z89511 Acquired absence of right leg below knee: Secondary | ICD-10-CM

## 2024-03-29 MED ORDER — PREGABALIN 75 MG PO CAPS: 75.0000 mg | ORAL_CAPSULE | Freq: Every day | ORAL | 1 refills | Status: AC

## 2024-03-29 MED ORDER — SULFAMETHOXAZOLE-TRIMETHOPRIM 400-80 MG PO TABS: 1.0000 | ORAL_TABLET | Freq: Every day | ORAL | 0 refills | Status: DC

## 2024-03-29 MED ORDER — ESCITALOPRAM OXALATE 10 MG PO TABS: 10.0000 mg | ORAL_TABLET | Freq: Every day | ORAL | 1 refills | Status: AC

## 2024-03-29 NOTE — Patient Instructions (Signed)
 Start taking Cymbalta  every other day for two weeks, then discontinue. Please stop the potassium and take only on days you take lasix .  Continue the Xeroform to the R leg until completely closed.   Follow up in 3 months, sooner if needed.

## 2024-03-29 NOTE — Progress Notes (Signed)
 Established Patient Office Visit  Subjective:  Patient ID: Levi [name], male    DOB: July 02, 1945  Age: 78 y.o. MRN: [id number]  Chief Complaint  Patient presents with   Transfer of care    HPI  Discussed the use of AI scribe software for clinical note transcription with the patient, who gave verbal consent to proceed.  History of Present Illness   Levi [name] is a 78 year old male who presents for medication management and follow-up on recent medical issues.  He is interested in resuming activities at the fitness center in [place], which he had paused due to recent health issues. He feels strong enough to start socializing and gradually increase his physical activity as he feels better.  He is considering stopping duloxetine , which was initially prescribed for nerve pain. It was prescribed before his amputation and was re-prescribed. He is unsure of its current benefit and would like to stop it.  There is confusion regarding a prescription for lamotrigine, which he received without his name on it. He received information that lamotrigine may be related to possible seizures, but he has not experienced seizures since childhood. He is uncertain about starting this medication due to potential side effects. Further review shows that this was prescribed by a physician at [facility] spine and neurology for concern for absence seizures. Pt has an EEG ordered as well.   He was prescribed a potassium supplement, which he would like to stop. He uses furosemide  (Lasix ) as needed but has not taken it recently. His last potassium level was normal.  His current medications include Bactrim  400-80 mg daily due to his immunosuppression from long term prednisone  use, Lexapro 10 mg (recently prescribed), and pregabalin . He takes pregabalin  150 mg in the morning and 75 mg at night.  He reports an improvement in energy levels and is pleased with the healing of a leg wound, which he dresses daily.  He uses Xeriform on the wound and notes significant improvement.  No new symptoms since the last visit and notes an overall improvement in his condition.       Patient Active Problem List   Diagnosis Date Noted   Malnutrition of moderate degree 03/12/2024   Acute heart failure with mildly reduced ejection fraction (HFmrEF) (HCC) 03/01/2024   Leukocytosis 03/01/2024   Debility 02/27/2024   MRSA bacteremia 02/22/2024   Left lower lobe pneumonia 02/21/2024   AKI (acute kidney injury) 02/21/2024   Hypokalemia 02/21/2024   Sacral wound, initial encounter 02/20/2024   Sepsis (HCC) 03/18/2023   Primary osteoarthritis, right shoulder 07/05/2022   BPH (benign prostatic hyperplasia) 02/21/2022   Acquired absence of right leg below knee (HCC) 01/22/2022   Peripheral neuropathy 02/22/2021   Chronic systolic heart failure (HCC) 02/22/2021   COPD (chronic obstructive pulmonary disease) (HCC) 03/21/2020   GERD (gastroesophageal reflux disease) status post Nissen fundoplication 12/03/2017   Hyperlipidemia    Paroxysmal atrial fibrillation (HCC) 05/11/2012   Asthma, moderate persistent 03/06/2009   Past Medical History:  Diagnosis Date   Agent orange exposure 1970's   takes Imdur ,Diltiazem , and Atacand  daily   Agent orange exposure    ALLERGIC RHINITIS    Allergy     seasonal allergies   Anemia    iron  deficiency   Arthritis    Asthma    uses inhaler and nebulizers   Atrial fibrillation (HCC)    Bruises easily    d/t prednisone  daily   Cataract    Cellulitis 03/22/2020   Chest  wall pain 09/21/2019   L scapula, reported 4/27 21/     CHF (congestive heart failure) (HCC)    dx-on meds to treat   Chronic bronchitis (HCC)    used to get it q yr; last time ?2008 (04/28/2012)   Clotting disorder 2019   On blood thinner when I bleed it is for a consideral amount of time. This is tegardless of size of wound.   COPD (chronic obstructive pulmonary disease) (HCC)    agent orange exposure    Degenerative disk disease    everywhere (04/28/2012)   Depression due to physical illness 12/15/2023   Diverticulitis    Dysrhythmia    afib   Elevated uric acid in blood    takes Allopurinol  daily   Emphysema of lung (HCC)    Enlarged prostate    but not on any meds   Essential (primary) hypertension 11/15/2020   GERD (gastroesophageal reflux disease)    takes Protonix  daily   H/O hiatal hernia    Headache    History of colonic polyps 02/09/2013   01/2013 - 3 adenomas max 8 mm  01/2015 2 diminutive adenomas - repeat colon 2021     History of colonoscopy    History of pneumonia    a. 2010   History of total right hip arthroplasty 07/31/2015   Hyperlipidemia    takes Lipitor daily; pt. states he takes a preventive   Hypothyroidism    Joint pain    Joint swelling    Leukocytosis    Lumbar stenosis status post T12-S1 fusion 04/07/2017   MSSA bacteremia 03/19/2023   Nausea and vomiting 11/12/2022   Neuromuscular disorder (HCC)    bilat legs, and bilat arms   Neuropathy    Osteoarthritis of left knee 01/10/2012   Osteoporosis 2012   PAF (paroxysmal atrial fibrillation) (HCC)    a. Dx 04/2012, CHA2DS2VASc = 1 (age);  b. 04/2012 Echo: EF 40-50%, mild MR.   Personal history of colonic adenomas 02/09/2013   Pneumonia    Primary osteoarthritis of right knee 06/17/2014   Sepsis (HCC) 03/18/2023   Shortness of breath dyspnea    Thyroid  disease    no taking meds-caused dizziness   Past Surgical History:  Procedure Laterality Date   79 HOUR PH STUDY N/A 05/30/2021   Procedure: 24 HOUR PH STUDY;  Surgeon: San Sandor GAILS, DO;  Location: [facility];  Service: Gastroenterology;  Laterality: N/A;   AMPUTATION Right 11/14/2023   Procedure: AMPUTATION BELOW KNEE;  Surgeon: Harden Jerona GAILS, MD;  Location: [facility] OR;  Service: Orthopedics;  Laterality: Right;   AMPUTATION TOE Bilateral 08/13/2023   Procedure: LEFT SECOND TOE AMPUTATION AND RIGHT FIFTH TOE AMPUTATION;  Surgeon:  Malvin Marsa FALCON, DPM;  Location: [facility] OR;  Service: Orthopedics/Podiatry;  Laterality: Bilateral;   ANTERIOR CERVICAL DECOMP/DISCECTOMY FUSION N/A 07/21/2012   Procedure: ANTERIOR CERVICAL DECOMPRESSION/DISCECTOMY FUSION 2 LEVELS;  Surgeon: Victory Gens, MD;  Location: [facility];  Service: Neurosurgery;  Laterality: N/A;  C4-5 C5-6 Anterior cervical decompression/diskectomy/fusion   ANTERIOR LAT LUMBAR FUSION N/A 04/07/2017   Procedure: Thoracic twelve-Lumbar one Anterolateral decompression/fusion;  Surgeon: Gens Victory, MD;  Location: [facility] OR;  Service: Neurosurgery;  Laterality: N/A;   ARTHRODESIS FOOT WITH WEIL OSTEOTOMY Left 11/07/2022   Procedure: LEFT THIRD METATARSOPHALANGEAL CAPSULOTOMY, WEIL OSTEOTOMY;  Surgeon: Elsa Lonni SAUNDERS, MD;  Location[facility];  Service: Orthopedics;  Laterality: Left;  LENGTH OF SURGERY: 60 MINUTES   BACK SURGERY     x 3  BELOW KNEE LEG AMPUTATION Right    June 2025   BONE EXOSTOSIS EXCISION Right 10/16/2023   Procedure: EXCISION, EXOSTOSIS;  Surgeon: Janit Thresa HERO, DPM;  Location: [facility];  Service: Orthopedics/Podiatry;  Laterality: Right;   CARDIAC CATHETERIZATION  11/2008   Dr. Jeffrie - 20% calcified non flow limiting left main, 50% EF apical hypokinesis   CARPAL TUNNEL RELEASE Bilateral    CERVICAL DISC ARTHROPLASTY N/A 04/07/2017   Procedure: Cervical six-seven Disc arthroplasty;  Surgeon: Colon Shove, MD;  Location: [facility] OR;  Service: Neurosurgery;  Laterality: N/A;   CHEST TUBE INSERTION Left 04/11/2017   Procedure: CHEST TUBE INSERTION;  Surgeon: Fleeta Hanford Coy, MD;  Location: [facility] OR;  Service: Thoracic;  Laterality: Left;   CHOLECYSTECTOMY     COLONOSCOPY  2016   CG-MAC-miralax (good)servere TICS/TA x 2   ESOPHAGEAL MANOMETRY N/A 05/30/2021   Procedure: ESOPHAGEAL MANOMETRY (EM);  Surgeon: San Sandor GAILS, DO;  Location: [facility];  Service: Gastroenterology;  Laterality: N/A;  ph impedence   ESOPHAGOGASTRODUODENOSCOPY     EYE  SURGERY Bilateral 2011   steroidal encapsulation (04/28/2012)   FOOT SURGERY Right    x 2   JOINT REPLACEMENT     Many surgeries including 5 joit replacements   KNEE ARTHROSCOPY Bilateral    LATERAL / POSTERIOR COMBINED FUSION LUMBAR SPINE  2012   LEFT ATRIAL APPENDAGE OCCLUSION N/A 02/22/2021   Procedure: LEFT ATRIAL APPENDAGE OCCLUSION;  Surgeon: Cindie Ole DASEN, MD;  [facility];  Service: Cardiovascular;  Laterality: N/A;   NEUROPLASTY / TRANSPOSITION ULNAR NERVE AT ELBOW Bilateral    POLYPECTOMY  2016   severe TICS/TA x 2   POSTERIOR CERVICAL LAMINECTOMY WITH MET- RX Right 06/11/2021   Procedure: Right Cervical six-seven  Laminectomy and foraminotomy with metrex;  Surgeon: Colon Shove, MD;  Location: [facility];  Service: Neurosurgery;  Laterality: Right;   REFRACTIVE SURGERY Left 07/07/2023   laser surgery but still has floaters   REVERSE SHOULDER ARTHROPLASTY  04/28/2012   Procedure: REVERSE SHOULDER ARTHROPLASTY;  Surgeon: Eva Elsie Herring, MD;  Location: [facility] OR;  Service: Orthopedics;  Laterality: Left;  Left shouder reverse total shoulder arthroplasty   SHOULDER SURGERY     SPINE SURGERY     I have had 7 spinal surgeries   TEE WITHOUT CARDIOVERSION N/A 02/22/2021   Procedure: TRANSESOPHAGEAL ECHOCARDIOGRAM (TEE);  Surgeon: Cindie Ole DASEN, MD;  Location: [facility] INVASIVE CV LAB;  Service: Cardiovascular;  Laterality: N/A;   TENDON REPAIR Right 08/07/2020   Procedure: RIGHT HAND LIGAMENT RECONSTRUCTION AND TENDON INTERPOSITION;  Surgeon: Yvone Rush, MD;  Location: [facility];  Service: Orthopedics;  Laterality: Right;   TONSILLECTOMY AND ADENOIDECTOMY  1954   TOTAL HIP ARTHROPLASTY Left 2011   left (04/28/2012)   TOTAL HIP ARTHROPLASTY Right 07/31/2015   Procedure: TOTAL HIP ARTHROPLASTY ANTERIOR APPROACH;  Surgeon: Rush Yvone, MD;  Location: [facility];  Service: Orthopedics;  Laterality: Right;   TOTAL KNEE ARTHROPLASTY  01/10/2012    Procedure: TOTAL KNEE ARTHROPLASTY;  Surgeon: Rush LITTIE Yvone, MD;  Location: [facility];  Service: Orthopedics;;  left total knee arthroplasty   TOTAL KNEE ARTHROPLASTY Right 06/17/2014   Procedure: TOTAL KNEE ARTHROPLASTY;  Surgeon: Rush LITTIE Yvone, MD;  Location: [facility];  Service: Orthopedics;  Laterality: Right;   toupet fundolplication  11/01/2021   TRANSESOPHAGEAL ECHOCARDIOGRAM (CATH LAB) N/A 03/24/2023   Procedure: TRANSESOPHAGEAL ECHOCARDIOGRAM;  Surgeon: Mona Vinie BROCKS, MD;  [facility];  Service: Cardiovascular;  Laterality: N/A;   TRANSESOPHAGEAL ECHOCARDIOGRAM (CATH LAB) N/A 02/25/2024   Procedure: TRANSESOPHAGEAL ECHOCARDIOGRAM;  Surgeon: Jeffrie Oneil BROCKS, MD;  Location: [facility];  Service: Cardiovascular;  Laterality: N/A;   Social History   Tobacco Use   Smoking status: Never   Smokeless tobacco: Never  Vaping Use   Vaping status: Never Used  Substance Use Topics   Alcohol use: No   Drug use: No      ROS: as noted in HPI  Objective:     BP 107/70   Pulse 90   SpO2 96%  BP Readings from Last 3 Encounters:  03/29/24 107/70  03/16/24 119/74  03/12/24 132/66   Wt Readings from Last 3 Encounters:  03/12/24 148 lb (67.1 kg)  02/21/24 190 lb (86.2 kg)  01/15/24 185 lb (83.9 kg)      Physical Exam Vitals and nursing note reviewed. Exam conducted with a chaperone present.  Constitutional:      General: He is not in acute distress.    Appearance: Normal appearance. He is not ill-appearing, toxic-appearing or diaphoretic.  HENT:     Head: Normocephalic and atraumatic.     Right Ear: There is no impacted cerumen.     Left Ear: There is no impacted cerumen.     Nose: Nose normal.     Mouth/Throat:     Mouth: Mucous membranes are dry.     Pharynx: Oropharynx is clear. No oropharyngeal exudate or posterior oropharyngeal erythema.  Eyes:     General: No scleral icterus.       Right eye: No discharge.        Left eye: No discharge.      Extraocular Movements: Extraocular movements intact.     Pupils: Pupils are equal, round, and reactive to light.  Neck:     Thyroid : No thyroid  mass, thyromegaly or thyroid  tenderness.  Cardiovascular:     Rate and Rhythm: Normal rate and regular rhythm.     Pulses: Normal pulses.     Heart sounds: No murmur heard. Pulmonary:     Effort: Pulmonary effort is normal. No respiratory distress.     Breath sounds: Normal breath sounds. No stridor. No wheezing or rhonchi.  Musculoskeletal:        General: Deformity (R BKA) present.     Cervical back: Normal range of motion and neck supple. No rigidity or tenderness.       Legs:  Lymphadenopathy:     Cervical: No cervical adenopathy.  Skin:    General: Skin is warm and dry.     Coloration: Skin is not jaundiced.     Findings: Lesion (superficial ulceration to R anterior lower leg halfway between amputation site and patella - NEARLY RESOLVED) present. No bruising, erythema or rash.  Neurological:     General: No focal deficit present.     Mental Status: He is alert and oriented to person, place, and time.  Psychiatric:        Mood and Affect: Mood normal.        Behavior: Behavior normal.      No results found for any visits on 03/29/24.  Last CBC Lab Results  Component Value Date   WBC 9.3 03/11/2024   HGB 9.1 (L) 03/11/2024   HCT 30.0 (L) 03/11/2024   MCV 91.7 03/11/2024   MCH 27.8 03/11/2024   RDW 18.0 (H) 03/11/2024   PLT 198 03/11/2024   Last metabolic panel Lab Results  Component Value Date  GLUCOSE 97 03/12/2024   NA 143 03/12/2024   K 3.8 03/12/2024   CL 106 03/12/2024   CO2 26 03/12/2024   BUN 28 (H) 03/12/2024   CREATININE 0.79 03/12/2024   GFRNONAA >60 03/12/2024   CALCIUM  8.6 (L) 03/12/2024   PHOS 3.4 12/28/2023   PROT 4.7 (L) 03/08/2024   ALBUMIN 2.2 (L) 03/08/2024   LABGLOB 1.9 01/01/2024   AGRATIO 1.6 12/03/2019   BILITOT 0.5 03/08/2024   ALKPHOS 72 03/08/2024   AST 17 03/08/2024   ALT 16  03/08/2024   ANIONGAP 11 03/12/2024   Last lipids Lab Results  Component Value Date   CHOL 208 (H) 07/05/2022   HDL 112 07/05/2022   LDLCALC 82 07/05/2022   TRIG 65 07/05/2022   CHOLHDL 1.9 07/05/2022   Last hemoglobin A1c Lab Results  Component Value Date   HGBA1C 5.8 (H) 07/05/2022   Last thyroid  functions Lab Results  Component Value Date   TSH 1.70 07/05/2022   Last vitamin D No results found for: 25OHVITD2, 25OHVITD3, VD25OH Last vitamin B12 and Folate Lab Results  Component Value Date   VITAMINB12 560 01/01/2024   FOLATE 12.2 01/01/2024      The ASCVD Risk score (Arnett DK, et al., 2019) failed to calculate for the following reasons:   The valid HDL cholesterol range is 20 to 100 mg/dL  Assessment & Plan:  MRSA bacteremia -     Sulfamethoxazole -Trimethoprim ; Take 1 tablet by mouth daily.  Dispense: 60 tablet; Refill: 0 -     CBC with Differential/Platelet -     Basic metabolic panel with GFR  Depression due to physical illness -     Escitalopram Oxalate; Take 1 tablet (10 mg total) by mouth daily.  Dispense: 90 tablet; Refill: 1  History of right below knee amputation (HCC) -     Pregabalin ; Take 1 capsule (75 mg total) by mouth daily.  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 Nerve conduction tests performed. Being followed / managed by the specialist  Conversion disorder with suspected seizures and convulsions Suspected absence seizures with staring episodes. Awaiting EEG results before starting Lamotrigine. - Order EEG to confirm diagnosis of absence seizures.  Phantom limb pain Duloxetine  ineffective for nerve pain. - Taper off duloxetine  by taking it every other day for 1-2 weeks before stopping completely.  Open wound of lower leg Wound  significantly improved with Xeroform.  - Continue current wound care regimen  Medication management Potassium supplementation not needed without diuretic. Bactrim  to continue unless prednisone  reduced. Lexapro and pregabalin  dosages adjusted. - Discontinue potassium supplementation unless diuretic use resumes. - Continue Bactrim  indefinitely unless prednisone  dose is reduced. - Adjust pregabalin  to 150 mg in the morning and 75 mg at night. - Continue Lexapro as prescribed.  General Health Maintenance Encouraged gradual increase in gym activities to avoid overexertion. - Provide clearance letter for gym activities.        Return in about 3 months (around 06/29/2024).   Benton LITTIE Gave, PA

## 2024-03-30 ENCOUNTER — Other Ambulatory Visit: Payer: Self-pay

## 2024-03-30 ENCOUNTER — Ambulatory Visit (INDEPENDENT_AMBULATORY_CARE_PROVIDER_SITE_OTHER): Admitting: Internal Medicine

## 2024-03-30 ENCOUNTER — Encounter: Payer: Self-pay | Admitting: Internal Medicine

## 2024-03-30 ENCOUNTER — Ambulatory Visit: Payer: Self-pay | Admitting: Urgent Care

## 2024-03-30 VITALS — BP 113/71 | HR 78 | Temp 97.9°F

## 2024-03-30 DIAGNOSIS — Z96612 Presence of left artificial shoulder joint: Secondary | ICD-10-CM

## 2024-03-30 DIAGNOSIS — R053 Chronic cough: Secondary | ICD-10-CM | POA: Diagnosis not present

## 2024-03-30 DIAGNOSIS — Z96653 Presence of artificial knee joint, bilateral: Secondary | ICD-10-CM

## 2024-03-30 DIAGNOSIS — B9562 Methicillin resistant Staphylococcus aureus infection as the cause of diseases classified elsewhere: Secondary | ICD-10-CM

## 2024-03-30 DIAGNOSIS — Z96643 Presence of artificial hip joint, bilateral: Secondary | ICD-10-CM

## 2024-03-30 DIAGNOSIS — R7881 Bacteremia: Secondary | ICD-10-CM

## 2024-03-30 LAB — CBC WITH DIFFERENTIAL/PLATELET
Basophils Absolute: 0 x10E3/uL (ref 0.0–0.2)
Basos: 0 %
EOS (ABSOLUTE): 0 x10E3/uL (ref 0.0–0.4)
Eos: 0 %
Hematocrit: 34.9 % — ABNORMAL LOW (ref 37.5–51.0)
Hemoglobin: 11.3 g/dL — ABNORMAL LOW (ref 13.0–17.7)
Lymphocytes Absolute: 0.4 x10E3/uL — ABNORMAL LOW (ref 0.7–3.1)
Lymphs: 4 %
MCH: 28.8 pg (ref 26.6–33.0)
MCHC: 32.4 g/dL (ref 31.5–35.7)
MCV: 89 fL (ref 79–97)
Monocytes Absolute: 0.2 x10E3/uL (ref 0.1–0.9)
Monocytes: 2 %
Neutrophils Absolute: 8.9 x10E3/uL — ABNORMAL HIGH (ref 1.4–7.0)
Neutrophils: 89 %
Platelets: 190 x10E3/uL (ref 150–450)
RBC: 3.93 x10E6/uL — ABNORMAL LOW (ref 4.14–5.80)
RDW: 16.7 % — ABNORMAL HIGH (ref 11.6–15.4)
WBC: 10 x10E3/uL (ref 3.4–10.8)

## 2024-03-30 LAB — BASIC METABOLIC PANEL WITH GFR
BUN/Creatinine Ratio: 21 (ref 10–24)
BUN: 19 mg/dL (ref 8–27)
CO2: 22 mmol/L (ref 20–29)
Calcium: 9.1 mg/dL (ref 8.6–10.2)
Chloride: 102 mmol/L (ref 96–106)
Creatinine, Ser: 0.89 mg/dL (ref 0.76–1.27)
Glucose: 103 mg/dL — ABNORMAL HIGH (ref 70–99)
Potassium: 4.6 mmol/L (ref 3.5–5.2)
Sodium: 139 mmol/L (ref 134–144)
eGFR: 89 mL/min/1.73 (ref >59)

## 2024-03-30 LAB — IMMATURE CELLS
MYELOCYTES: 1 % — ABNORMAL HIGH (ref 0–0)
Metamyelocytes: 4 % — ABNORMAL HIGH (ref 0–0)

## 2024-03-30 NOTE — Patient Instructions (Addendum)
 We'll need to monitor you off antibiotics for up to 6 months before clearing you for free of this recent infection  Usually if you do well up to around 2-3 months the chance is very good    See me in 6 weeks for follow up   If fever chill, malaise or sign of flu like symptoms, or any new persistent bone joint pain let me know earlier    Please discuss with your lung doctor to see if you could go down on prednisone  of if there is any substitute for it

## 2024-03-30 NOTE — Progress Notes (Signed)
 [facility]  Patient Active Problem List   Diagnosis Date Noted   Malnutrition of moderate degree 03/12/2024   Acute heart failure with mildly reduced ejection fraction (HFmrEF) (HCC) 03/01/2024   Leukocytosis 03/01/2024   Debility 02/27/2024   MRSA bacteremia 02/22/2024   Left lower lobe pneumonia 02/21/2024   AKI (acute kidney injury) 02/21/2024   Hypokalemia 02/21/2024   Sacral wound, initial encounter 02/20/2024   Sepsis (HCC) 03/18/2023   Primary osteoarthritis, right shoulder 07/05/2022   BPH (benign prostatic hyperplasia) 02/21/2022   Acquired absence of right leg below knee (HCC) 01/22/2022   Peripheral neuropathy 02/22/2021   Chronic systolic heart failure (HCC) 02/22/2021   COPD (chronic obstructive pulmonary disease) (HCC) 03/21/2020   GERD (gastroesophageal reflux disease) status post Nissen fundoplication 12/03/2017   Hyperlipidemia    Paroxysmal atrial fibrillation (HCC) 05/11/2012   Asthma, moderate persistent 03/06/2009      Subjective:    Patient ID: Levi [name], male    DOB: 1946/01/22, 78 y.o.   MRN: [id number]  Chief Complaint  Patient presents with   Follow-up    Questions about if/when he can have rotator cuff sx    HPI:  Levi [name] is a 78 y.o. male here for f/u hospital admission mrsa bsi   Patient multiple hardware Tee negative Had 4 weeks dapto finished 03/22/24 Feeling much better but still not baseline No new joint pain/back pain Right shoulder chronic pain No n/v/d No rash  Eating well      Allergies  Allergen Reactions   Levaquin  [Levofloxacin ] Itching    Tolerated ciprofloxacin  in 2022.      Outpatient Medications Prior to Visit  Medication Sig Dispense Refill   acetaminophen  (TYLENOL ) 325 MG tablet Take 1-2 tablets (325-650 mg total) by mouth every 4 (four) hours as needed for mild pain (pain score 1-3).     albuterol  (VENTOLIN  HFA) 108 (90 Base) MCG/ACT inhaler INHALE 2  PUFFS BY MOUTH EVERY 4 HOURS AS NEEDED FOR WHEEZE OR FOR SHORTNESS OF BREATH 8 each 4   apixaban  (ELIQUIS ) 5 MG TABS tablet Take 1 tablet (5 mg total) by mouth 2 (two) times daily. 180 tablet 1   atorvastatin  (LIPITOR) 20 MG tablet Take 1 tablet (20 mg total) by mouth daily. 90 tablet 3   diltiazem  (CARDIZEM  CD) 180 MG 24 hr capsule Take 1 capsule (180 mg total) by mouth daily. 90 capsule 3   escitalopram (LEXAPRO) 10 MG tablet Take 1 tablet (10 mg total) by mouth daily. 90 tablet 1   ferrous gluconate  (FERGON) 324 MG tablet Take 1 tablet (324 mg total) by mouth daily. 30 tablet 0   ipratropium-albuterol  (DUONEB) 0.5-2.5 (3) MG/3ML SOLN 1 NEBULE EVERY 6 HOURS AS NEEDED. **J45.3** 270 mL 4   nutrition supplement, JUVEN, (JUVEN) PACK Take 1 packet by mouth 2 (two) times daily between meals.     Nystatin (GERHARDT'S BUTT CREAM) CREA Apply 1 Application topically 3 (three) times daily.     Olodaterol HCl (STRIVERDI RESPIMAT) 2.5 MCG/ACT AERS INHALE 2 PUFFS BY MOUTH INTO THE LUNGS DAILY 1 each 2   oxyCODONE  (OXY IR/ROXICODONE ) 5 MG immediate release tablet Take 1 tablet (5 mg total) by mouth every 4 (four) hours as needed for severe pain (pain score 7-10) or moderate pain (pain score 4-6). 30 tablet 0   pantoprazole  (PROTONIX ) 40 MG tablet Take 1 tablet (40 mg total) by mouth 2 (two) times daily before a meal.  60 tablet 0   predniSONE  (DELTASONE ) 5 MG tablet Take 4 tablets (20 mg total) by mouth daily with breakfast. 3 tabs daily     pregabalin  (LYRICA ) 150 MG capsule Take 1 capsule (150 mg total) by mouth 2 (two) times daily. (Patient taking differently: Take 75-150 mg by mouth See admin instructions. Takes 150 mg in the morning and 75 in the evening) 60 capsule 2   pregabalin  (LYRICA ) 75 MG capsule Take 1 capsule (75 mg total) by mouth daily. 90 capsule 1   sulfamethoxazole -trimethoprim  (BACTRIM ) 400-80 MG tablet Take 1 tablet by mouth daily. 60 tablet 0   tamsulosin  (FLOMAX ) 0.4 MG CAPS capsule Take 1  capsule (0.4 mg total) by mouth at bedtime. 30 capsule 0   hydrocerin (EUCERIN) CREA Apply 1 Application topically 2 (two) times daily. (Patient not taking: Reported on 03/30/2024) 454 g 0   melatonin 5 MG TABS Take 1 tablet (5 mg total) by mouth at bedtime. (Patient not taking: Reported on 03/30/2024) 30 tablet 0   Multiple Vitamin (MULTIVITAMIN WITH MINERALS) TABS tablet Take 1 tablet by mouth daily. (Patient not taking: Reported on 03/30/2024)     polyethylene glycol (MIRALAX  / GLYCOLAX ) 17 g packet Take 17 g by mouth 2 (two) times daily. (Patient not taking: Reported on 03/30/2024)     potassium chloride  (KLOR-CON  M) 10 MEQ tablet Take 1 tablet (10 mEq total) by mouth daily. (Patient not taking: Reported on 03/30/2024) 30 tablet 0   senna (SENOKOT) 8.6 MG TABS tablet Take 2 tablets (17.2 mg total) by mouth daily. (Patient not taking: Reported on 03/30/2024) 120 tablet 0   No facility-administered medications prior to visit.     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 Daughter lives two miles away. 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 Enjoys being active and working.      Tobacco use, amount per day now: None   Past tobacco use, amount per day: None   How many years did you use tobacco:   Alcohol use (drinks  per week): None   Diet:   Do you drink/eat things with caffeine : Coffee   Marital status:  Married                                What year were you married? 1973   Do you live in a house, apartment, assisted living, condo, trailer, etc.? House    Is it one or more stories? 3 stories   How many persons live in your home? 2   Do you have pets in your home?( please list) 3 [employer]    Highest Level of education completed? Engineer, Maintenance (it).  Current or past profession: Police    Do you exercise?    Yes                              Type and how often? 5 times weekly   Do you have a living will? Yes   Do you have a DNR form?     Yes                              If not, do you want to discuss one?   Do you have signed POA/HPOA forms?    Yes                    If so, please bring to you appointment      Do you have any difficulty bathing or dressing yourself?    Do you have any difficulty preparing food or eating? No   Do you have any difficulty managing your medications? No   Do you have any difficulty managing your finances? No   Do you have any difficulty affording your medications? No   Social Drivers of Corporate Investment Banker Strain: Low Risk  (03/28/2024)   Overall Financial Resource Strain (CARDIA)    Difficulty of Paying Living Expenses: Not hard at all  Food Insecurity: No Food Insecurity (03/28/2024)   Hunger Vital Sign    Worried About Running Out of Food in the Last Year: Never true    Ran Out of Food in the Last Year: Never true  Transportation Needs: No Transportation Needs (03/28/2024)   PRAPARE - Administrator, Civil Service (Medical): No    Lack of Transportation (Non-Medical): No  Recent Concern: Transportation Needs - Unmet Transportation Needs (02/05/2024)   PRAPARE - Transportation    Lack of Transportation (Medical): Yes    Lack of Transportation (Non-Medical): Yes  Physical Activity: Unknown (03/28/2024)   Exercise Vital Sign    Days of Exercise  per Week: Patient declined    Minutes of Exercise per Session: Not on file  Recent Concern: Physical Activity - Inactive (02/05/2024)   Exercise Vital Sign    Days of Exercise per Week: 0 days    Minutes of Exercise per Session: Not on file  Stress: No Stress Concern Present (03/28/2024)   [employer] of Occupational Health - Occupational Stress Questionnaire    Feeling of Stress: Only a little  Social Connections: Socially Integrated (03/28/2024)   Social Connection and Isolation Panel    Frequency of Communication with Friends and Family: More than three times a week    Frequency of Social Gatherings with Friends and Family: Three times a week    Attends Religious Services: More than 4 times per year    Active Member of Clubs or Organizations: Yes    Attends Banker Meetings: 1 to 4 times per year    Marital Status: Married  Catering Manager Violence: Not At Risk (02/22/2024)   Humiliation, Afraid, Rape, and Kick questionnaire    Fear of Current or Ex-Partner: No    Emotionally Abused: No    Physically Abused: No    Sexually Abused: No      Review of Systems    All other ros negative  Objective:    BP 113/71   Pulse 78   Temp 97.9 F (36.6 C) (Temporal)   SpO2  98%  Nursing note and vital signs reviewed.  Physical Exam     General/constitutional: no distress, pleasant HEENT: Normocephalic, PER, Conj Clear, EOMI, Oropharynx clear Neck supple CV: rrr no mrg Lungs: clear to auscultation, normal respiratory effort Abd: Soft, Nontender Ext: no edema Skin: No Rash Neuro: nonfocal MSK: right bka/prosthetics; right shoulder limited active rom (abduction about 30 degree external rotation about 60 degree full internal rotation)   Labs: Lab Results  Component Value Date   WBC 10.0 03/29/2024   HGB 11.3 (L) 03/29/2024   HCT 34.9 (L) 03/29/2024   MCV 89 03/29/2024   PLT 190 03/29/2024   Last metabolic panel Lab Results  Component Value Date    GLUCOSE 103 (H) 03/29/2024   NA 139 03/29/2024   K 4.6 03/29/2024   CL 102 03/29/2024   CO2 22 03/29/2024   BUN 19 03/29/2024   CREATININE 0.89 03/29/2024   EGFR 89 03/29/2024   CALCIUM  9.1 03/29/2024   PHOS 3.4 12/28/2023   PROT 4.7 (L) 03/08/2024   ALBUMIN 2.2 (L) 03/08/2024   LABGLOB 1.9 01/01/2024   AGRATIO 1.6 12/03/2019   BILITOT 0.5 03/08/2024   ALKPHOS 72 03/08/2024   AST 17 03/08/2024   ALT 16 03/08/2024   ANIONGAP 11 03/12/2024    Micro:  Serology:  Imaging:  Assessment & Plan:   Problem List Items Addressed This Visit     MRSA bacteremia - Primary      No orders of the defined types were placed in this encounter.    Assessment: 78 yo old male afib on eliquis , HFrEF, copd, hx bka, stage 1-2 sacral decub, neuropathy, extensive hardware presence (knees, hips, left ankle, left shoulder, entire back), admitted for a week of progressive weakness and a couple days fever, chill, found to have mrsa bsi     Of note, he appears to be on chronic prednisone  for ?copd 20 mg daily   Has had chronic intermittent dry cough since beginning of August unchanged was treated for hcap sometimes in August that didn't affect the cough. Not hypoxic   Cxr probably left lower lobe attelectasis   Do not feel cap present     With mrsa will need to see if pulm emboli an issue but no particular need to get chest ct now. Tte and tee will be needed to r/o endocarditis     [place] area no new pain but over the next few days would benefit from repeat questioning to see if further investigation needed   At least 4 weeks abx will be needed if tte/tee negative and no obvious active bone/joint infection   9/27 bcx 2 of 2 set with mrsa       With pred use will get chest ct to see if something else amock like pjp, or emboli related to mrsa bsi    ------------------- 03/30/24 id clinic assessment Copd on chronic prednisone  high dose 20 mg daily -- he does have a pulmonologist  (will be seeing dr Kassie in follow up 4 weeks daptomycin finished 10/27 He had some other cellulitis/abscess on the buttock and had 10 days augmentin  as well during recent admission 02/23/24  Admission TEE was negative for valvular sign of endocarditis   Patient still weak overall but significantly better -- 70%  No new joint pain/back pain Appetite is good Right shoulder chronic pain stable 4/10 worse at 7/10 and again this has been baseline -- he was slotted to have surgery/hardware in the right shoulder but will need  to monitor off antibiotics for 6 months before considering that    -advise patient to discuss with pulmonology to see if he can go down on prednisone  -continue for now bactrim  prophylaxis as long as prednisone  is > 15 mg per day -once prednisone  can be titrated to below 15 mg, the bactrim  can be discontinued about 6 months after -discussed sign of relapse including new bone/joint pain, fever, chill, increasing malaise, confusion -f/u 6 weeks with labs with me     Follow-up: Return in about 6 weeks (around 05/11/2024).      Constance ONEIDA Passer, MD [facility] 03/30/2024, 1:54 PM

## 2024-03-31 NOTE — Telephone Encounter (Signed)
 Copied from CRM [id number]. Topic: Clinical - Home Health Verbal Orders >> Mar 31, 2024 10:25 AM Treva T wrote: Caller/Agency: Signe, Occupational Therapist, [facility]  Callback Number: [phone]  Service Requested: Occupational Therapy  Frequency: once a week for 9 weeks   Any new concerns about the patient? No

## 2024-03-31 NOTE — Telephone Encounter (Signed)
 This task has been completed as requested. Please review other TE for additional information. Spoke to Signe [facility]) directly regarding the occupational therapy for the patient. Verbal order provided. No further action needed.

## 2024-04-01 ENCOUNTER — Ambulatory Visit (HOSPITAL_COMMUNITY)
Admission: RE | Admit: 2024-04-01 | Discharge: 2024-04-01 | Disposition: A | Discharge status: Home / Self Care | Source: Ambulatory Visit | Attending: Orthopedic Surgery | Admitting: Orthopedic Surgery

## 2024-04-01 DIAGNOSIS — M25511 Pain in right shoulder: Secondary | ICD-10-CM | POA: Insufficient documentation

## 2024-04-01 DIAGNOSIS — G8929 Other chronic pain: Secondary | ICD-10-CM | POA: Diagnosis not present

## 2024-04-01 DIAGNOSIS — Z01818 Encounter for other preprocedural examination: Secondary | ICD-10-CM | POA: Diagnosis not present

## 2024-04-01 DIAGNOSIS — M19011 Primary osteoarthritis, right shoulder: Secondary | ICD-10-CM | POA: Diagnosis not present

## 2024-04-02 ENCOUNTER — Ambulatory Visit (INDEPENDENT_AMBULATORY_CARE_PROVIDER_SITE_OTHER): Admitting: Pulmonary Disease

## 2024-04-02 ENCOUNTER — Telehealth: Payer: Self-pay | Admitting: Urgent Care

## 2024-04-02 ENCOUNTER — Encounter (HOSPITAL_BASED_OUTPATIENT_CLINIC_OR_DEPARTMENT_OTHER): Payer: Self-pay | Admitting: Pulmonary Disease

## 2024-04-02 VITALS — BP 118/70 | HR 79 | Ht 74.0 in | Wt 192.0 lb

## 2024-04-02 DIAGNOSIS — B9562 Methicillin resistant Staphylococcus aureus infection as the cause of diseases classified elsewhere: Secondary | ICD-10-CM

## 2024-04-02 DIAGNOSIS — J45909 Unspecified asthma, uncomplicated: Secondary | ICD-10-CM

## 2024-04-02 DIAGNOSIS — Z7952 Long term (current) use of systemic steroids: Secondary | ICD-10-CM

## 2024-04-02 DIAGNOSIS — J4489 Other specified chronic obstructive pulmonary disease: Secondary | ICD-10-CM | POA: Diagnosis not present

## 2024-04-02 DIAGNOSIS — R7881 Bacteremia: Secondary | ICD-10-CM

## 2024-04-02 MED ORDER — PREDNISONE 10 MG PO TABS: 10.0000 mg | ORAL_TABLET | Freq: Every day | ORAL | 2 refills | Status: DC

## 2024-04-02 MED ORDER — STIOLTO RESPIMAT 2.5-2.5 MCG/ACT IN AERS: 2.0000 | INHALATION_SPRAY | Freq: Every day | RESPIRATORY_TRACT | 5 refills | Status: AC

## 2024-04-02 MED ORDER — SULFAMETHOXAZOLE-TRIMETHOPRIM 400-80 MG PO TABS: 1.0000 | ORAL_TABLET | Freq: Every day | ORAL | 5 refills | Status: DC

## 2024-04-02 NOTE — Telephone Encounter (Signed)
 Copied from CRM [id number]. Topic: Clinical - Home Health Verbal Orders >> Apr 02, 2024  3:16 PM Zebedee SAUNDERS wrote: Caller/Agency: [facility] per [name] Rushing Number: [phone] Service Requested: Physical Therapy Frequency: 2 week for 3 weeks, 1x week for 6weeks  Any new concerns about the patient? No

## 2024-04-02 NOTE — Assessment & Plan Note (Signed)
 Completed antibiotics --Reduce risk of pneumonia by reducing prednisone  as above --Bactrim  ppx on prednisone  therapy

## 2024-04-02 NOTE — Progress Notes (Signed)
 Subjective:   PATIENT ID: Levi [name] GENDER: male DOB: [birth year], MRN: [id number]  Chief Complaint  Patient presents with   Asthma    Follow up/dr [name] patient establish new doc    Reason for Visit: New patient  Mr. Levi [name] is a 78 year old male with COPD, asthma, pAF, chronic systolic heart failure, HTN, HLD, GERD, neuropathy, osteoarthritis, s/p R BKA, BPH who presents as a new patient.   He was previously followed by Dr. Neysa for asthma. On Striverdi, daily prednisone  20 mg and PRN albuterol  and PRN Duonebs. He is currently being followed by ID for MRSA bacteremia s/p daptomycin x 4 weeks completed on 03/22/24. With his recent infections, ID recommended decreasing prednisone ; continuing bactrim  until prednisone  is <15 mg daily.  He was hospitalized 02/21/24-02/27/24 and 02/27/24-03/12/24 for pneumonia, MRSA bacteremia and debilitation. From a pulmonary standpoint he reports he is overall doing well. He has tried to reduce his prednisone  dose but reports sweating so resumed 20 mg daily. Compliant with Striverdi and uses albuterol  1-2 times a week for shortness of breath. Occasional wheezing.    I have personally reviewed patient's past medical/family/social history, allergies, current medications.  Past Medical History:  Diagnosis Date   Agent orange exposure 1970's   takes Imdur ,Diltiazem , and Atacand  daily   Agent orange exposure    ALLERGIC RHINITIS    Allergy     seasonal allergies   Anemia    iron  deficiency   Arthritis    Asthma    uses inhaler and nebulizers   Atrial fibrillation (HCC)    Bruises easily    d/t prednisone  daily   Cataract    Cellulitis 03/22/2020   Chest wall pain 09/21/2019   L scapula, reported 4/27 21/     CHF (congestive heart failure) (HCC)    dx-on meds to treat   Chronic bronchitis (HCC)    used to get it q yr; last time ?2008 (04/28/2012)   Clotting disorder 2019   On blood thinner when I bleed it is for a consideral  amount of time. This is tegardless of size of wound.   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 2010   History of total right hip arthroplasty 07/31/2015   Hyperlipidemia    takes Lipitor daily; pt. states he takes a preventive   Hypothyroidism    Joint pain    Joint swelling    Leukocytosis    Lumbar stenosis status post T12-S1 fusion 04/07/2017   MSSA bacteremia 03/19/2023   Nausea and vomiting 11/12/2022   Neuromuscular disorder (HCC)    bilat legs, and bilat arms   Neuropathy    Osteoarthritis of left knee 01/10/2012  Osteoporosis 2012   PAF (paroxysmal atrial fibrillation) (HCC)    a. Dx 04/2012, CHA2DS2VASc = 1 (age);  b. 04/2012 Echo: EF 40-50%, mild MR.   Personal history of colonic adenomas 02/09/2013   Pneumonia    Primary osteoarthritis of right knee 06/17/2014   Sepsis (HCC) 03/18/2023   Shortness of breath dyspnea    Thyroid  disease    no taking meds-caused dizziness     Family History  Problem Relation Age of Onset   Diabetes Mother    Heart disease Father        Died 16, MI   Cancer Father    Hyperlipidemia Father    Stroke Father    Prostate cancer Brother    Colon polyps Brother    Diabetes Brother    Colon polyps Brother    Asthma Brother    COPD Brother    Colon cancer Brother        Dx age 77   Cancer Brother     Colon cancer Other    Esophageal cancer Neg Hx    Rectal cancer Neg Hx    Stomach cancer Neg Hx    Pancreatic cancer Neg Hx    Kidney disease Neg Hx    Liver disease Neg Hx    Neuropathy Neg Hx      Social History   Occupational History   Occupation: Retired    Comment: nurse, 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     Outpatient Medications Prior to Visit  Medication Sig Dispense Refill   acetaminophen  (TYLENOL ) 325 MG tablet Take 1-2 tablets (325-650 mg total) by mouth every 4 (four) hours as needed for mild pain (pain score 1-3).     albuterol  (VENTOLIN  HFA) 108 (90 Base) MCG/ACT inhaler INHALE 2 PUFFS BY MOUTH EVERY 4 HOURS AS NEEDED FOR WHEEZE OR FOR SHORTNESS OF BREATH 8 each 4   allopurinol  (ZYLOPRIM ) 100 MG tablet Take 100 mg by mouth daily.     apixaban  (ELIQUIS ) 5 MG TABS tablet Take 1 tablet (5 mg total) by mouth 2 (two) times daily. 180 tablet 1   atorvastatin  (LIPITOR) 20 MG tablet Take 1 tablet (20 mg total) by mouth daily. 90 tablet 3   cyclobenzaprine  (FLEXERIL ) 10 MG tablet Take 10 mg by mouth 3 (three) times daily as needed.     diltiazem  (CARDIZEM  CD) 180 MG 24 hr capsule Take 1 capsule (180 mg total) by mouth daily. 90 capsule 3   DULoxetine  (CYMBALTA ) 30 MG capsule Take 30 mg by mouth 2 (two) times daily.     escitalopram (LEXAPRO) 10 MG tablet Take 1 tablet (10 mg total) by mouth daily. 90 tablet 1   ferrous gluconate  (FERGON) 324 MG tablet Take 1 tablet (324 mg total) by mouth daily. 30 tablet 0   ipratropium-albuterol  (DUONEB) 0.5-2.5 (3) MG/3ML SOLN 1  NEBULE EVERY 6 HOURS AS NEEDED. **J45.3** 270 mL 4   isosorbide  mononitrate (IMDUR ) 30 MG 24 hr tablet Take 30 mg by mouth daily.  nutrition supplement, JUVEN, (JUVEN) PACK Take 1 packet by mouth 2 (two) times daily between meals.     oxyCODONE  (OXY IR/ROXICODONE ) 5 MG immediate release tablet Take 1 tablet (5 mg total) by mouth every 4 (four) hours as needed for severe pain (pain score 7-10) or moderate pain (pain score 4-6). 30 tablet 0   pantoprazole  (PROTONIX ) 40 MG tablet Take 1 tablet (40 mg total) by mouth 2 (two) times daily before a meal. 60 tablet 0   predniSONE  (DELTASONE ) 5 MG tablet Take 4 tablets (20 mg total) by mouth daily with breakfast. 3 tabs daily     pregabalin  (LYRICA ) 150 MG capsule Take 1 capsule (150 mg total) by mouth 2 (two) times daily. (Patient taking differently: Take 75-150 mg by mouth See admin instructions. Takes 150 mg in the morning and 75 in the evening) 60 capsule 2   pregabalin  (LYRICA ) 75 MG capsule Take 1 capsule (75 mg total) by mouth daily. 90 capsule 1   tamsulosin  (FLOMAX ) 0.4 MG CAPS capsule Take 1 capsule (0.4 mg total) by mouth at bedtime. 30 capsule 0   Olodaterol HCl (STRIVERDI RESPIMAT) 2.5 MCG/ACT AERS INHALE 2 PUFFS BY MOUTH INTO THE LUNGS DAILY 1 each 2   sulfamethoxazole -trimethoprim  (BACTRIM ) 400-80 MG tablet Take 1 tablet by mouth daily. 60 tablet 0   hydrocerin (EUCERIN) CREA Apply 1 Application topically 2 (two) times daily. (Patient not taking: Reported on 03/30/2024) 454 g 0   melatonin 5 MG TABS Take 1 tablet (5 mg total) by mouth at bedtime. (Patient not taking: Reported on 03/30/2024) 30 tablet 0   Multiple Vitamin (MULTIVITAMIN WITH MINERALS) TABS tablet Take 1 tablet by mouth daily. (Patient not taking: Reported on 03/30/2024)     Nystatin (GERHARDT'S BUTT CREAM) CREA Apply 1 Application topically 3 (three) times daily.     polyethylene glycol (MIRALAX  / GLYCOLAX ) 17 g packet Take 17 g by mouth 2 (two) times daily. (Patient not taking:  Reported on 03/30/2024)     potassium chloride  (KLOR-CON  M) 10 MEQ tablet Take 1 tablet (10 mEq total) by mouth daily. (Patient not taking: Reported on 03/30/2024) 30 tablet 0   senna (SENOKOT) 8.6 MG TABS tablet Take 2 tablets (17.2 mg total) by mouth daily. (Patient not taking: Reported on 03/30/2024) 120 tablet 0   No facility-administered medications prior to visit.    Review of Systems  Constitutional:  Negative for chills, diaphoresis, fever, malaise/fatigue and weight loss.  HENT:  Negative for congestion.   Respiratory:  Negative for cough, hemoptysis, sputum production, shortness of breath and wheezing.   Cardiovascular:  Negative for chest pain, palpitations and leg swelling.     Objective:   Vitals:   04/02/24 0907  BP: 118/70  Pulse: 79  SpO2: 96%  Weight: 192 lb (87.1 kg)  Height: 6' 2 (1.88 m)   SpO2: 96 %  Physical Exam: General: Well-appearing, no acute distress HENT: , AT Eyes: EOMI, no scleral icterus Respiratory: Clear to auscultation bilaterally.  No crackles, wheezing or rales Cardiovascular: RRR, -M/R/G, no JVD Extremities:-Edema,-tenderness Neuro: AAO x4, CNII-XII grossly intact Psych: Normal mood, normal affect  Data Reviewed:  Imaging: CT Chest 02/22/24 - Paraseptal and centrilobular emphysema. Bilateral trace/small pleural effusions L>R  PFT: 01/04/15 FVC 4.55 (86%) FEV1 3.28 (83%) Ratio 72  TLC 75% DLCO 77% Interpretation: Mild restrictive defect with mildly reduced gas exchange.  Labs: CBC    Component Value Date/Time   WBC 10.0 03/29/2024 1114   WBC 9.3 03/11/2024 0424  RBC 3.93 (L) 03/29/2024 1114   RBC 3.27 (L) 03/11/2024 0424   HGB 11.3 (L) 03/29/2024 1114   HCT 34.9 (L) 03/29/2024 1114   PLT 190 03/29/2024 1114   MCV 89 03/29/2024 1114   MCH 28.8 03/29/2024 1114   MCH 27.8 03/11/2024 0424   MCHC 32.4 03/29/2024 1114   MCHC 30.3 03/11/2024 0424   RDW 16.7 (H) 03/29/2024 1114   LYMPHSABS 0.4 (L) 03/29/2024 1114   MONOABS  0.0 (L) 02/28/2024 0412   EOSABS 0.0 03/29/2024 1114   BASOSABS 0.0 03/29/2024 1114   No peripheral eosinophila     Assessment & Plan:   Discussion: 78 year old male with COPD, asthma, pAF, chronic systolic heart failure, HTN, HLD, GERD, neuropathy, osteoarthritis, s/p R BKA, BPH who presents as a new patient. Reviewed hospital course and pulmonary outpatient notes. Plan as noted below.     Assessment & Plan On prednisone  therapy Chronic use for respiratory symptoms. At risk for adrenal suppression. Has been on meds for 14 years --Reduce prednisone  from 20 mg to 17.5 mg --Monitor blood pressure twice a day  >Please contact us  if systolic blood pressure <90 (the top number)  >Monitor signs of dizziness, headaches, fatigue, visual changes, lower blood pressure, shortness of breath and wheezing --CONTINUE Bactrim  400-800 ONE tablet ONCE a day >Once prednisone  is <15 mg, will stop bactrim  after 6 months  Asthma-COPD overlap syndrome (HCC) --STOP Striverdi --START Stiolto TWO puffs ONCE a day --CONTINUE Albuterol  1-2 puffs AS NEEDED MRSA bacteremia Completed antibiotics --Reduce risk of pneumonia by reducing prednisone  as above --Bactrim  ppx on prednisone  therapy    Health Maintenance Immunization History  Administered Date(s) Administered   Fluad Quad(high Dose 65+) 02/17/2019   Fluad Trivalent(High Dose 65+) 04/07/2023   Fluzone Influenza virus vaccine,trivalent (IIV3), split virus 01/14/2021   H1N1 03/24/2008   INFLUENZA, HIGH DOSE SEASONAL PF 02/18/2020   Influenza Split 02/09/2009, 01/30/2011, 02/06/2012   Influenza Whole 02/09/2009, 01/25/2010, 02/24/2010   Influenza,inj,Quad PF,6+ Mos 02/08/2013, 02/28/2014, 02/13/2015, 01/22/2016, 01/30/2017, 01/22/2022   Influenza-Unspecified 02/08/2013, 02/28/2014, 02/13/2015, 01/22/2016, 01/30/2017, 01/24/2020   Novel Infuenza-h1n1-09 03/24/2008   PFIZER(Purple Top)SARS-COV-2 Vaccination 07/03/2019, 07/24/2019, 01/24/2020,  02/01/2020, 08/30/2020   Pfizer(Comirnaty)Fall Seasonal Vaccine 12 years and older 08/06/2022   Pneumococcal Conjugate-13 04/27/2015   Pneumococcal Polysaccharide-23 05/27/2002, 02/10/2009, 01/06/2013   Td 01/14/2020, 01/14/2020   Td (Adult) 05/27/2002   Tdap 01/14/2020, 05/01/2021, 11/08/2023   Unspecified SARS-COV-2 Vaccination 02/25/2022   CT Lung Screen - not qualified due to age  No orders of the defined types were placed in this encounter.  Meds ordered this encounter  Medications   predniSONE  (DELTASONE ) 10 MG tablet    Sig: Take 1 tablet (10 mg total) by mouth daily with breakfast.    Dispense:  90 tablet    Refill:  2   sulfamethoxazole -trimethoprim  (BACTRIM ) 400-80 MG tablet    Sig: Take 1 tablet by mouth daily.    Dispense:  60 tablet    Refill:  5   Tiotropium Bromide-Olodaterol (STIOLTO RESPIMAT) 2.5-2.5 MCG/ACT AERS    Sig: Inhale 2 puffs into the lungs daily.    Dispense:  4 g    Refill:  5    Return in about 6 weeks (around 05/14/2024).  I have spent a total time of 45-minutes on the day of the appointment reviewing prior documentation, coordinating care and discussing medical diagnosis and plan with the patient/family. Imaging, labs and tests included in this note have been reviewed and interpreted independently by me.  Sintia Mckissic Slater Staff, MD [facility] 04/02/2024 12:09 PM

## 2024-04-02 NOTE — Patient Instructions (Signed)
 On prednisone  therapy Chronic use for respiratory symptoms. At risk for adrenal suppression. Has been on meds for 14 years --Reduce prednisone  from 20 mg to 17.5 mg --Monitor blood pressure twice a day  >Please contact us  if systolic blood pressure <90 (the top number)  >Monitor signs of dizziness, headaches, fatigue, visual changes, lower blood pressure, shortness of breath and wheezing --CONTINUE Bactrim  400-800 ONE tablet ONCE a day >Once prednisone  is <15 mg, will stop bactrim  after 6 months  Asthma-COPD overlap syndrome (HCC) --STOP Striverdi --START Stiolto TWO puffs ONCE a day --CONTINUE Albuterol  1-2 puffs AS NEEDED

## 2024-04-02 NOTE — Telephone Encounter (Signed)
 Noted

## 2024-04-05 DIAGNOSIS — M519 Unspecified thoracic, thoracolumbar and lumbosacral intervertebral disc disorder: Secondary | ICD-10-CM | POA: Diagnosis not present

## 2024-04-05 DIAGNOSIS — I480 Paroxysmal atrial fibrillation: Secondary | ICD-10-CM | POA: Diagnosis not present

## 2024-04-05 DIAGNOSIS — F54 Psychological and behavioral factors associated with disorders or diseases classified elsewhere: Secondary | ICD-10-CM | POA: Diagnosis not present

## 2024-04-05 DIAGNOSIS — M810 Age-related osteoporosis without current pathological fracture: Secondary | ICD-10-CM | POA: Diagnosis not present

## 2024-04-05 DIAGNOSIS — Z89422 Acquired absence of other left toe(s): Secondary | ICD-10-CM | POA: Diagnosis not present

## 2024-04-05 DIAGNOSIS — I5042 Chronic combined systolic (congestive) and diastolic (congestive) heart failure: Secondary | ICD-10-CM | POA: Diagnosis not present

## 2024-04-05 DIAGNOSIS — J439 Emphysema, unspecified: Secondary | ICD-10-CM | POA: Diagnosis not present

## 2024-04-05 DIAGNOSIS — K219 Gastro-esophageal reflux disease without esophagitis: Secondary | ICD-10-CM | POA: Diagnosis not present

## 2024-04-05 DIAGNOSIS — E785 Hyperlipidemia, unspecified: Secondary | ICD-10-CM | POA: Diagnosis not present

## 2024-04-05 DIAGNOSIS — Z89511 Acquired absence of right leg below knee: Secondary | ICD-10-CM | POA: Diagnosis not present

## 2024-04-05 DIAGNOSIS — E538 Deficiency of other specified B group vitamins: Secondary | ICD-10-CM | POA: Diagnosis not present

## 2024-04-05 DIAGNOSIS — M48061 Spinal stenosis, lumbar region without neurogenic claudication: Secondary | ICD-10-CM | POA: Diagnosis not present

## 2024-04-05 DIAGNOSIS — G629 Polyneuropathy, unspecified: Secondary | ICD-10-CM | POA: Diagnosis not present

## 2024-04-05 DIAGNOSIS — D509 Iron deficiency anemia, unspecified: Secondary | ICD-10-CM | POA: Diagnosis not present

## 2024-04-05 DIAGNOSIS — N39498 Other specified urinary incontinence: Secondary | ICD-10-CM | POA: Diagnosis not present

## 2024-04-05 DIAGNOSIS — J4489 Other specified chronic obstructive pulmonary disease: Secondary | ICD-10-CM | POA: Diagnosis not present

## 2024-04-05 DIAGNOSIS — I110 Hypertensive heart disease with heart failure: Secondary | ICD-10-CM | POA: Diagnosis not present

## 2024-04-05 DIAGNOSIS — Z4789 Encounter for other orthopedic aftercare: Secondary | ICD-10-CM | POA: Diagnosis not present

## 2024-04-05 DIAGNOSIS — E039 Hypothyroidism, unspecified: Secondary | ICD-10-CM | POA: Diagnosis not present

## 2024-04-05 DIAGNOSIS — E440 Moderate protein-calorie malnutrition: Secondary | ICD-10-CM | POA: Diagnosis not present

## 2024-04-05 DIAGNOSIS — J309 Allergic rhinitis, unspecified: Secondary | ICD-10-CM | POA: Diagnosis not present

## 2024-04-05 DIAGNOSIS — M109 Gout, unspecified: Secondary | ICD-10-CM | POA: Diagnosis not present

## 2024-04-05 DIAGNOSIS — I872 Venous insufficiency (chronic) (peripheral): Secondary | ICD-10-CM | POA: Diagnosis not present

## 2024-04-05 DIAGNOSIS — J4540 Moderate persistent asthma, uncomplicated: Secondary | ICD-10-CM | POA: Diagnosis not present

## 2024-04-05 DIAGNOSIS — D72829 Elevated white blood cell count, unspecified: Secondary | ICD-10-CM | POA: Diagnosis not present

## 2024-04-05 NOTE — Telephone Encounter (Unsigned)
 Copied from CRM [id number]. Topic: Clinical - Home Health Verbal Orders >> Apr 05, 2024  1:22 PM Rosaria BRAVO wrote: Caller/Agency: [name] Gaba Callback Number: [phone] secure VM Service Requested: Physical Therapy Frequency:  Continue 2w3 1w6 Strengthening, transfer, balance, safety ed, walking program  Any new concerns about the patient? No

## 2024-04-05 NOTE — Telephone Encounter (Signed)
 Okay thank you

## 2024-04-07 DIAGNOSIS — Z89511 Acquired absence of right leg below knee: Secondary | ICD-10-CM | POA: Diagnosis not present

## 2024-04-07 DIAGNOSIS — J439 Emphysema, unspecified: Secondary | ICD-10-CM | POA: Diagnosis not present

## 2024-04-07 DIAGNOSIS — Z4789 Encounter for other orthopedic aftercare: Secondary | ICD-10-CM | POA: Diagnosis not present

## 2024-04-07 DIAGNOSIS — I5042 Chronic combined systolic (congestive) and diastolic (congestive) heart failure: Secondary | ICD-10-CM | POA: Diagnosis not present

## 2024-04-07 DIAGNOSIS — I480 Paroxysmal atrial fibrillation: Secondary | ICD-10-CM | POA: Diagnosis not present

## 2024-04-07 DIAGNOSIS — I110 Hypertensive heart disease with heart failure: Secondary | ICD-10-CM | POA: Diagnosis not present

## 2024-04-07 NOTE — Telephone Encounter (Unsigned)
 Copied from CRM [id number]. Topic: Clinical - Home Health Verbal Orders >> Apr 05, 2024  1:22 PM Rosaria BRAVO wrote: Caller/Agency: [name] Gaba Callback Number: [phone] secure VM Service Requested: Physical Therapy Frequency:  Continue 2w3 1w6 Strengthening, transfer, balance, safety ed, walking program  Any new concerns about the patient? No >> Apr 07, 2024 11:31 AM Bobbette CROME wrote: Forwarded message to Provider and CMA.KL >> Apr 07, 2024 10:50 AM Zebedee SAUNDERS wrote: Received call from [facility] for verbal orders.  Lori with [employer] Number: [phone]

## 2024-04-07 NOTE — Telephone Encounter (Signed)
Orders approved. Thanks!

## 2024-04-07 NOTE — Telephone Encounter (Unsigned)
 Copied from CRM [id number]. Topic: Clinical - Home Health Verbal Orders >> Apr 05, 2024  1:22 PM Rosaria BRAVO wrote: Caller/Agency: [name] Gaba Callback Number: [phone] secure VM Service Requested: Physical Therapy Frequency:  Continue 2w3 1w6 Strengthening, transfer, balance, safety ed, walking program  Any new concerns about the patient? No >> Apr 07, 2024 10:50 AM Zebedee SAUNDERS wrote: Received call from [facility] for verbal orders.  Lori with [employer] Number: [phone]

## 2024-04-08 DIAGNOSIS — G5603 Carpal tunnel syndrome, bilateral upper limbs: Secondary | ICD-10-CM | POA: Diagnosis not present

## 2024-04-08 DIAGNOSIS — G7001 Myasthenia gravis with (acute) exacerbation: Secondary | ICD-10-CM | POA: Diagnosis not present

## 2024-04-08 DIAGNOSIS — G3184 Mild cognitive impairment, so stated: Secondary | ICD-10-CM | POA: Diagnosis not present

## 2024-04-08 DIAGNOSIS — G7080 Lambert-Eaton syndrome, unspecified: Secondary | ICD-10-CM | POA: Diagnosis not present

## 2024-04-08 DIAGNOSIS — G40209 Localization-related (focal) (partial) symptomatic epilepsy and epileptic syndromes with complex partial seizures, not intractable, without status epilepticus: Secondary | ICD-10-CM | POA: Diagnosis not present

## 2024-04-08 NOTE — Telephone Encounter (Signed)
 Verbal orders given to [name] at [employer].

## 2024-04-09 ENCOUNTER — Encounter: Admitting: Urgent Care

## 2024-04-12 DIAGNOSIS — Z89511 Acquired absence of right leg below knee: Secondary | ICD-10-CM | POA: Diagnosis not present

## 2024-04-12 DIAGNOSIS — J439 Emphysema, unspecified: Secondary | ICD-10-CM | POA: Diagnosis not present

## 2024-04-12 DIAGNOSIS — I5042 Chronic combined systolic (congestive) and diastolic (congestive) heart failure: Secondary | ICD-10-CM | POA: Diagnosis not present

## 2024-04-12 DIAGNOSIS — Z4789 Encounter for other orthopedic aftercare: Secondary | ICD-10-CM | POA: Diagnosis not present

## 2024-04-12 DIAGNOSIS — I110 Hypertensive heart disease with heart failure: Secondary | ICD-10-CM | POA: Diagnosis not present

## 2024-04-12 DIAGNOSIS — I480 Paroxysmal atrial fibrillation: Secondary | ICD-10-CM | POA: Diagnosis not present

## 2024-04-15 ENCOUNTER — Ambulatory Visit: Admitting: Orthopedic Surgery

## 2024-04-15 DIAGNOSIS — M25511 Pain in right shoulder: Secondary | ICD-10-CM

## 2024-04-15 DIAGNOSIS — G8929 Other chronic pain: Secondary | ICD-10-CM

## 2024-04-16 ENCOUNTER — Telehealth: Payer: Self-pay

## 2024-04-16 ENCOUNTER — Encounter: Payer: Self-pay | Admitting: Orthopedic Surgery

## 2024-04-16 DIAGNOSIS — Z89511 Acquired absence of right leg below knee: Secondary | ICD-10-CM | POA: Diagnosis not present

## 2024-04-16 DIAGNOSIS — I480 Paroxysmal atrial fibrillation: Secondary | ICD-10-CM | POA: Diagnosis not present

## 2024-04-16 DIAGNOSIS — I5042 Chronic combined systolic (congestive) and diastolic (congestive) heart failure: Secondary | ICD-10-CM | POA: Diagnosis not present

## 2024-04-16 DIAGNOSIS — I110 Hypertensive heart disease with heart failure: Secondary | ICD-10-CM | POA: Diagnosis not present

## 2024-04-16 DIAGNOSIS — J439 Emphysema, unspecified: Secondary | ICD-10-CM | POA: Diagnosis not present

## 2024-04-16 DIAGNOSIS — Z4789 Encounter for other orthopedic aftercare: Secondary | ICD-10-CM | POA: Diagnosis not present

## 2024-04-16 NOTE — Telephone Encounter (Unsigned)
 Copied from CRM [id number]. Topic: Clinical - Home Health Verbal Orders >> Apr 16, 2024 12:43 PM Dedra NOVAK wrote: Caller/Agency: Cherlyn, PT at [facility] Callback Number: [phone] Service Requested: Skilled Nursing; requesting eval for R lower leg wound Frequency: TBD after eval Any new concerns about the patient? Yes, R lower leg wound

## 2024-04-16 NOTE — Progress Notes (Signed)
 Office Visit Note   Patient: Levi [name]           Date of Birth: 1945/06/12           MRN: [id number] Visit Date: 04/15/2024 Requested by: [name] [place], [place] [address], [address] [place],  [place] [address] PCP: [name] [place], [place]  Subjective: Chief Complaint  Patient presents with   Right Shoulder - Pain    HPI: Levi [name] is a 78 y.o. male who presents to the office reporting continued right shoulder pain.  Since he was last seen has had a CT scan.  Patient does have significant anterior superior escape of the humeral head with attritional wear of the acromion.  Does have a history of recent MRSA infection as well and has been told by their report from infectious disease not to consider any elective surgery for at least 6 months.  He is also taking about 17-1/2 mg of prednisone  a day but is on a tapered dose..                ROS: All systems reviewed are negative as they relate to the chief complaint within the history of present illness.  Patient denies fevers or chills.  Assessment & Plan: Visit Diagnoses:  1. Chronic right shoulder pain     Plan: Impression is severe right shoulder rotator cuff arthropathy with anterior superior escape of the humeral head.  His deltoid remains functional but his arm is not functional.  He has a lot of medical comorbidities regarding reverse replacement on the right-hand side.  That includes his prednisone  use as well as history of recent infection as well as other medical comorbidities including paroxysmal atrial fibrillation chronic systolic heart failure and COPD.  He also has a right leg amputation and would be putting significant stress on that replacement.  For now he is going to hold off until he is tapered off of his prednisone  a little more.  A little bit farther out from the MRSA infection.  Come back in for months for reevaluation.  Follow-Up Instructions: No follow-ups on file.   Orders:  No orders of the defined types were  placed in this encounter.  No orders of the defined types were placed in this encounter.     Procedures: No procedures performed   Clinical Data: No additional findings.  Objective: Vital Signs: There were no vitals taken for this visit.  Physical Exam:  Constitutional: Patient appears well-developed HEENT:  Head: Normocephalic Eyes:EOM are normal Neck: Normal range of motion Cardiovascular: Normal rate Pulmonary/chest: Effort normal Neurologic: Patient is alert Skin: Skin is warm Psychiatric: Patient has normal mood and affect  Ortho Exam: Ortho exam is unchanged.  Deltoid is functional.  He only has about 10 to 15 degrees of forward flexion and abduction with that right arm.  Motor sensory function of the hands intact.  Subscap strength is about 5- out of 5 on the right.  Specialty Comments:  No specialty comments available.  Imaging: No results found.   PMFS History: Patient Active Problem List   Diagnosis Date Noted   Malnutrition of moderate degree 03/12/2024   Acute heart failure with mildly reduced ejection fraction (HFmrEF) (HCC) 03/01/2024   Leukocytosis 03/01/2024   Debility 02/27/2024   MRSA bacteremia 02/22/2024   Left lower lobe pneumonia 02/21/2024   AKI (acute kidney injury) 02/21/2024   Hypokalemia 02/21/2024   Sacral wound, initial encounter 02/20/2024   Sepsis (HCC) 03/18/2023  Primary osteoarthritis, right shoulder 07/05/2022   BPH (benign prostatic hyperplasia) 02/21/2022   Acquired absence of right leg below knee (HCC) 01/22/2022   Peripheral neuropathy 02/22/2021   Chronic systolic heart failure (HCC) 02/22/2021   COPD (chronic obstructive pulmonary disease) (HCC) 03/21/2020   GERD (gastroesophageal reflux disease) status post Nissen fundoplication 12/03/2017   Hyperlipidemia    Paroxysmal atrial fibrillation (HCC) 05/11/2012   Asthma, moderate persistent 03/06/2009   Past Medical History:  Diagnosis Date   Agent orange exposure  1970's   takes Imdur ,Diltiazem , and Atacand  daily   Agent orange exposure    ALLERGIC RHINITIS    Allergy     seasonal allergies   Anemia    iron  deficiency   Arthritis    Asthma    uses inhaler and nebulizers   Atrial fibrillation (HCC)    Bruises easily    d/t prednisone  daily   Cataract    Cellulitis 03/22/2020   Chest wall pain 09/21/2019   L scapula, reported 4/27 21/     CHF (congestive heart failure) (HCC)    dx-on meds to treat   Chronic bronchitis (HCC)    used to get it q yr; last time ?2008 (04/28/2012)   Clotting disorder 2019   On blood thinner when I bleed it is for a consideral amount of time. This is tegardless of size of wound.   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 2010   History of total right hip arthroplasty 07/31/2015   Hyperlipidemia    takes Lipitor daily; pt. states he takes a preventive   Hypothyroidism    Joint pain    Joint swelling    Leukocytosis    Lumbar stenosis status post T12-S1 fusion 04/07/2017   MSSA bacteremia 03/19/2023   Nausea and vomiting 11/12/2022   Neuromuscular disorder (HCC)    bilat legs, and bilat arms   Neuropathy    Osteoarthritis of left knee 01/10/2012   Osteoporosis 2012   PAF (paroxysmal atrial fibrillation) (HCC)    a. Dx 04/2012, CHA2DS2VASc = 1 (age);  b. 04/2012 Echo: EF  40-50%, mild MR.   Personal history of colonic adenomas 02/09/2013   Pneumonia    Primary osteoarthritis of right knee 06/17/2014   Sepsis (HCC) 03/18/2023   Shortness of breath dyspnea    Thyroid  disease    no taking meds-caused dizziness    Family History  Problem Relation Age of Onset   Diabetes Mother    Heart disease Father        Died 83, MI   Cancer Father    Hyperlipidemia Father    Stroke Father    Prostate cancer Brother    Colon polyps Brother  Diabetes Brother    Colon polyps Brother    Asthma Brother    COPD Brother    Colon cancer Brother        Dx age 21   Cancer Brother    Colon cancer Other    Esophageal cancer Neg Hx    Rectal cancer Neg Hx    Stomach cancer Neg Hx    Pancreatic cancer Neg Hx    Kidney disease Neg Hx    Liver disease Neg Hx    Neuropathy Neg Hx     Past Surgical History:  Procedure Laterality Date   62 HOUR PH STUDY N/A 05/30/2021   Procedure: 24 HOUR PH STUDY;  Surgeon: San Sandor GAILS, DO;  Location: [facility];  Service: Gastroenterology;  Laterality: N/A;   AMPUTATION Right 11/14/2023   Procedure: AMPUTATION BELOW KNEE;  Surgeon: Harden Jerona GAILS, MD;  Location: [facility] OR;  Service: Orthopedics;  Laterality: Right;   AMPUTATION TOE Bilateral 08/13/2023   Procedure: LEFT SECOND TOE AMPUTATION AND RIGHT FIFTH TOE AMPUTATION;  Surgeon: Malvin Marsa FALCON, DPM;  Location: [facility] OR;  Service: Orthopedics/Podiatry;  Laterality: Bilateral;   ANTERIOR CERVICAL DECOMP/DISCECTOMY FUSION N/A 07/21/2012   Procedure: ANTERIOR CERVICAL DECOMPRESSION/DISCECTOMY FUSION 2 LEVELS;  Surgeon: Victory Gens, MD;  Location: [facility];  Service: Neurosurgery;  Laterality: N/A;  C4-5 C5-6 Anterior cervical decompression/diskectomy/fusion   ANTERIOR LAT LUMBAR FUSION N/A 04/07/2017   Procedure: Thoracic twelve-Lumbar one Anterolateral decompression/fusion;  Surgeon: Gens Victory, MD;  Location: [facility] OR;  Service: Neurosurgery;  Laterality: N/A;    ARTHRODESIS FOOT WITH WEIL OSTEOTOMY Left 11/07/2022   Procedure: LEFT THIRD METATARSOPHALANGEAL CAPSULOTOMY, WEIL OSTEOTOMY;  Surgeon: Elsa Lonni SAUNDERS, MD;  Location: [facility] OR;  Service: Orthopedics;  Laterality: Left;  LENGTH OF SURGERY: 60 MINUTES   BACK SURGERY     x 3   BELOW KNEE LEG AMPUTATION Right    June 2025   BONE EXOSTOSIS EXCISION Right 10/16/2023   Procedure: EXCISION, EXOSTOSIS;  Surgeon: Janit Thresa HERO, DPM;  Location: [facility];  Service: Orthopedics/Podiatry;  Laterality: Right;   CARDIAC CATHETERIZATION  11/2008   Dr. Jeffrie - 20% calcified non flow limiting left main, 50% EF apical hypokinesis   CARPAL TUNNEL RELEASE Bilateral    CERVICAL DISC ARTHROPLASTY N/A 04/07/2017   Procedure: Cervical six-seven Disc arthroplasty;  Surgeon: Gens Victory, MD;  Location: [facility] OR;  Service: Neurosurgery;  Laterality: N/A;   CHEST TUBE INSERTION Left 04/11/2017   Procedure: CHEST TUBE INSERTION;  Surgeon: Fleeta Hanford Coy, MD;  Location[facility] OR;  Service: Thoracic;  Laterality: Left;   CHOLECYSTECTOMY     COLONOSCOPY  2016   CG-MAC-miralax (good)servere TICS/TA x 2   ESOPHAGEAL MANOMETRY N/A 05/30/2021   Procedure: ESOPHAGEAL MANOMETRY (EM);  Surgeon: San Sandor GAILS, DO;  Location: [facility];  Service: Gastroenterology;  Laterality: N/A;  ph impedence   ESOPHAGOGASTRODUODENOSCOPY     EYE SURGERY Bilateral 2011   steroidal encapsulation (04/28/2012)   FOOT SURGERY Right    x 2   JOINT REPLACEMENT     Many surgeries including 5 joit replacements   KNEE ARTHROSCOPY Bilateral    LATERAL / POSTERIOR COMBINED FUSION LUMBAR SPINE  2012   LEFT ATRIAL APPENDAGE OCCLUSION N/A 02/22/2021   Procedure: LEFT ATRIAL APPENDAGE OCCLUSION;  Surgeon: Cindie Ole DASEN, MD;  [facility];  Service: Cardiovascular;  Laterality: N/A;   NEUROPLASTY / TRANSPOSITION ULNAR NERVE AT ELBOW Bilateral    POLYPECTOMY  2016   severe TICS/TA  x 2   POSTERIOR CERVICAL LAMINECTOMY WITH MET-  RX Right 06/11/2021   Procedure: Right Cervical six-seven  Laminectomy and foraminotomy with metrex;  Surgeon: Colon Shove, MD;  Location: [facility];  Service: Neurosurgery;  Laterality: Right;   REFRACTIVE SURGERY Left 07/07/2023   laser surgery but still has floaters   REVERSE SHOULDER ARTHROPLASTY  04/28/2012   Procedure: REVERSE SHOULDER ARTHROPLASTY;  Surgeon: Eva Elsie Herring, MD;  Location: [facility] OR;  Service: Orthopedics;  Laterality: Left;  Left shouder reverse total shoulder arthroplasty   SHOULDER SURGERY     SPINE SURGERY     I have had 7 spinal surgeries   TEE WITHOUT CARDIOVERSION N/A 02/22/2021   Procedure: TRANSESOPHAGEAL ECHOCARDIOGRAM (TEE);  Surgeon: Cindie Ole DASEN, MD;  [facility]: [facility] INVASIVE CV LAB;  Service: Cardiovascular;  Laterality: N/A;   TENDON REPAIR Right 08/07/2020   Procedure: RIGHT HAND LIGAMENT RECONSTRUCTION AND TENDON INTERPOSITION;  Surgeon: Yvone Rush, MD;  Location: [facility];  Service: Orthopedics;  Laterality: Right;   TONSILLECTOMY AND ADENOIDECTOMY  1954   TOTAL HIP ARTHROPLASTY Left 2011   left (04/28/2012)   TOTAL HIP ARTHROPLASTY Right 07/31/2015   Procedure: TOTAL HIP ARTHROPLASTY ANTERIOR APPROACH;  Surgeon: Rush Yvone, MD;  Location: [facility] OR;  Service: Orthopedics;  Laterality: Right;   TOTAL KNEE ARTHROPLASTY  01/10/2012   Procedure: TOTAL KNEE ARTHROPLASTY;  Surgeon: Rush LITTIE Yvone, MD;  Location: [facility];  Service: Orthopedics;;  left total knee arthroplasty   TOTAL KNEE ARTHROPLASTY Right 06/17/2014   Procedure: TOTAL KNEE ARTHROPLASTY;  Surgeon: Rush LITTIE Yvone, MD;  Location: [facility];  Service: Orthopedics;  Laterality: Right;   toupet fundolplication  11/01/2021   TRANSESOPHAGEAL ECHOCARDIOGRAM (CATH LAB) N/A 03/24/2023   Procedure: TRANSESOPHAGEAL ECHOCARDIOGRAM;  Surgeon: Mona Vinie BROCKS, MD;  [facility]: [facility];  Service: Cardiovascular;  Laterality: N/A;   TRANSESOPHAGEAL ECHOCARDIOGRAM (CATH LAB) N/A  02/25/2024   Procedure: TRANSESOPHAGEAL ECHOCARDIOGRAM;  Surgeon: Jeffrie Oneil BROCKS, MD;  Location: [facility];  Service: Cardiovascular;  Laterality: N/A;   Social History   Occupational History   Occupation: Retired    Comment: nurse, 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

## 2024-04-16 NOTE — Telephone Encounter (Signed)
 Yes please He did well with xeroform in the past

## 2024-04-19 DIAGNOSIS — I110 Hypertensive heart disease with heart failure: Secondary | ICD-10-CM | POA: Diagnosis not present

## 2024-04-19 DIAGNOSIS — J439 Emphysema, unspecified: Secondary | ICD-10-CM | POA: Diagnosis not present

## 2024-04-19 DIAGNOSIS — Z4789 Encounter for other orthopedic aftercare: Secondary | ICD-10-CM | POA: Diagnosis not present

## 2024-04-19 DIAGNOSIS — Z89511 Acquired absence of right leg below knee: Secondary | ICD-10-CM | POA: Diagnosis not present

## 2024-04-19 DIAGNOSIS — I480 Paroxysmal atrial fibrillation: Secondary | ICD-10-CM | POA: Diagnosis not present

## 2024-04-19 DIAGNOSIS — I5042 Chronic combined systolic (congestive) and diastolic (congestive) heart failure: Secondary | ICD-10-CM | POA: Diagnosis not present

## 2024-04-19 NOTE — Telephone Encounter (Signed)
 VO left on [place] VM.

## 2024-04-19 NOTE — Telephone Encounter (Signed)
 Patients wife [name] called in to check and see if [facility] called in regarding needing a nursing visit for the patient. Patient has a wound on his right leg that needs checking and fell last night. I did let [name] know that Lorie from [facility] did call in to report and request a nursing visit for the patient.

## 2024-04-20 ENCOUNTER — Telehealth: Payer: Self-pay

## 2024-04-20 DIAGNOSIS — J439 Emphysema, unspecified: Secondary | ICD-10-CM | POA: Diagnosis not present

## 2024-04-20 DIAGNOSIS — I110 Hypertensive heart disease with heart failure: Secondary | ICD-10-CM | POA: Diagnosis not present

## 2024-04-20 DIAGNOSIS — Z89511 Acquired absence of right leg below knee: Secondary | ICD-10-CM | POA: Diagnosis not present

## 2024-04-20 DIAGNOSIS — I5042 Chronic combined systolic (congestive) and diastolic (congestive) heart failure: Secondary | ICD-10-CM | POA: Diagnosis not present

## 2024-04-20 DIAGNOSIS — I480 Paroxysmal atrial fibrillation: Secondary | ICD-10-CM | POA: Diagnosis not present

## 2024-04-20 DIAGNOSIS — Z4789 Encounter for other orthopedic aftercare: Secondary | ICD-10-CM | POA: Diagnosis not present

## 2024-04-20 NOTE — Telephone Encounter (Signed)
 Copied from CRM [id number]. Topic: Clinical - Home Health Verbal Orders >> Apr 20, 2024  2:58 PM Gustabo D wrote: Caller/Agency: [facility] Callback Number: [phone] Service Requested: Skilled Nursing Frequency: 2 times a week 6 weeks Any new concerns about the patient? No, just the wound care

## 2024-04-21 ENCOUNTER — Encounter (HOSPITAL_BASED_OUTPATIENT_CLINIC_OR_DEPARTMENT_OTHER): Admitting: Pulmonary Disease

## 2024-04-23 DIAGNOSIS — I480 Paroxysmal atrial fibrillation: Secondary | ICD-10-CM | POA: Diagnosis not present

## 2024-04-23 DIAGNOSIS — Z89511 Acquired absence of right leg below knee: Secondary | ICD-10-CM | POA: Diagnosis not present

## 2024-04-23 DIAGNOSIS — I110 Hypertensive heart disease with heart failure: Secondary | ICD-10-CM | POA: Diagnosis not present

## 2024-04-23 DIAGNOSIS — I5042 Chronic combined systolic (congestive) and diastolic (congestive) heart failure: Secondary | ICD-10-CM | POA: Diagnosis not present

## 2024-04-23 DIAGNOSIS — Z4789 Encounter for other orthopedic aftercare: Secondary | ICD-10-CM | POA: Diagnosis not present

## 2024-04-23 DIAGNOSIS — J439 Emphysema, unspecified: Secondary | ICD-10-CM | POA: Diagnosis not present

## 2024-04-24 NOTE — Telephone Encounter (Signed)
 Okay with home health orders. They did provide wound care to the area previously. I had also had pt use topical xeroform which worked well. Let me know if anything else is needed

## 2024-04-26 DIAGNOSIS — Z4789 Encounter for other orthopedic aftercare: Secondary | ICD-10-CM | POA: Diagnosis not present

## 2024-04-26 DIAGNOSIS — I5042 Chronic combined systolic (congestive) and diastolic (congestive) heart failure: Secondary | ICD-10-CM | POA: Diagnosis not present

## 2024-04-26 DIAGNOSIS — J439 Emphysema, unspecified: Secondary | ICD-10-CM | POA: Diagnosis not present

## 2024-04-26 DIAGNOSIS — I480 Paroxysmal atrial fibrillation: Secondary | ICD-10-CM | POA: Diagnosis not present

## 2024-04-26 DIAGNOSIS — I110 Hypertensive heart disease with heart failure: Secondary | ICD-10-CM | POA: Diagnosis not present

## 2024-04-26 DIAGNOSIS — Z89511 Acquired absence of right leg below knee: Secondary | ICD-10-CM | POA: Diagnosis not present

## 2024-04-26 NOTE — Telephone Encounter (Signed)
 Chyrl with [name] informed of verbal order approval

## 2024-04-27 DIAGNOSIS — I110 Hypertensive heart disease with heart failure: Secondary | ICD-10-CM | POA: Diagnosis not present

## 2024-04-27 DIAGNOSIS — I480 Paroxysmal atrial fibrillation: Secondary | ICD-10-CM | POA: Diagnosis not present

## 2024-04-27 DIAGNOSIS — I5042 Chronic combined systolic (congestive) and diastolic (congestive) heart failure: Secondary | ICD-10-CM | POA: Diagnosis not present

## 2024-04-27 DIAGNOSIS — Z4789 Encounter for other orthopedic aftercare: Secondary | ICD-10-CM | POA: Diagnosis not present

## 2024-04-27 DIAGNOSIS — J439 Emphysema, unspecified: Secondary | ICD-10-CM | POA: Diagnosis not present

## 2024-04-27 DIAGNOSIS — Z89511 Acquired absence of right leg below knee: Secondary | ICD-10-CM | POA: Diagnosis not present

## 2024-04-28 ENCOUNTER — Ambulatory Visit (HOSPITAL_BASED_OUTPATIENT_CLINIC_OR_DEPARTMENT_OTHER): Admitting: Family Medicine

## 2024-04-29 ENCOUNTER — Ambulatory Visit: Admitting: Neurology

## 2024-04-29 ENCOUNTER — Encounter: Payer: Self-pay | Admitting: Neurology

## 2024-04-29 VITALS — BP 101/63 | HR 91 | Ht 74.0 in | Wt 192.0 lb

## 2024-04-29 DIAGNOSIS — R296 Repeated falls: Secondary | ICD-10-CM | POA: Diagnosis not present

## 2024-04-29 DIAGNOSIS — Z79899 Other long term (current) drug therapy: Secondary | ICD-10-CM | POA: Diagnosis not present

## 2024-04-29 DIAGNOSIS — Z899 Acquired absence of limb, unspecified: Secondary | ICD-10-CM

## 2024-04-29 DIAGNOSIS — G629 Polyneuropathy, unspecified: Secondary | ICD-10-CM

## 2024-04-29 DIAGNOSIS — R2689 Other abnormalities of gait and mobility: Secondary | ICD-10-CM | POA: Diagnosis not present

## 2024-04-29 DIAGNOSIS — G8929 Other chronic pain: Secondary | ICD-10-CM

## 2024-04-29 NOTE — Progress Notes (Signed)
 Subjective:    Patient ID: Levi [name] is a 78 y.o. male.  HPI    Interim history:  Levi [name] is a 78 year old right-handed gentleman with an underlying complex medical history of asthma, A. fib, hyperlipidemia, reflux disease with hiatal hernia, diverticulitis, degenerative disc disease, COPD, allergic rhinitis, history of agent orange exposure in the 70s, carpal tunnel syndrome, status post bilateral carpal tunnel release and ulnar release surgeries in January and February of this year, status post multiple surgeries including lumbar spine surgery, neck surgery, bilateral hip replacements, bilateral knee replacements, shoulder surgeries, status post right distal below-knee amputation, MRSA bacteremia, recent hospitalizations for pneumonia, who presents for follow-up consultation of his neuropathy.  He was last seen in our clinic in July 2025 by Greig Forbes, NP, at which time he was on Cymbalta  and Lyrica .   Today, 04/29/2024: He reports feeling pain in right stump. Hands are weaker, hands hurt.  PCP sent him to pain management and he reports that he was started on a new medication for small seizures with suspected seizures.  Records are not available for my review today.  He is on low-dose lamotrigine.  He is followed by infectious diseases for MRSA bacteremia.  He is on Bactrim  chronically.  No longer has a PICC line.  He had recent admissions to the hospital for pneumonia in September and August 2025.  He is currently on Lyrica  150 mg in the morning and 75 mg in the evening per PCP and Cymbalta  30 mg once daily in the morning 3 days a week.  He was recently started on low-dose Lexapro  per PCP.  He is having trouble transferring.  He slid out of his chair but did not have a recent fall.  He is in a wheelchair today.   The patient's allergies, current medications, family history, past medical history, past social history, past surgical history and problem list were reviewed and updated as  appropriate.   Previously:  12/24/2023 (Amy Lomax, NP): <<Levi [name] returns for follow up for neuropathy. He was last seen 11/2022 and felt neuropathy was stable on duloxetine  30mg  daily and pregabalin  75mg  up to three times daily. Since, he has had a series of unfortunate events over the past year. He had a foot infection resulting in MRSA sepsis and requiring multiple surgeries with progressive amputations. Last BKA 10/2023. He has had a couple of falls as well over the past 9 months. He reports generalized weakness and deconditioning since. He feels neuropathy pain in hands has worsened. Dr Curtis has adjusted meds. Now taking pregabalin  100mg  BID and duloxetine  30mg  daily. He tells me that they plan to increase duloxetine  in the next few weeks if needed. He is working with PT. Being evaluated for prosthesis next week.    During his last hospitalization, he reports an event of sudden onset of double vision and right sided weakness. He did not tell hospital staff as he was afraid it would delay his surgery. Since being home, right sided weakness continues. He does feel he is slowly regaining strength with PT. He has noted more tremor of right hand when trying to pick things up. CT 10/2023 was performed 11/08/23 following a fall and showed advanced white matter changes but no acute findings. Last MRI 04/2022 showed atrophy. He has noted more memory loss. Wife reports he seems to be more repetitive in questioning.    12/24/2022 ALL:  Levi [name] returns for follow up for neuropathy. He was last seen 05/2022 and reported discoloration of left  foot. Pulses found with doppler. He was seen by PCP the next day and sent for ABI. He was referred to Dr Serene, vascular. Normal blood flow felt to be present. He was advised to wear compression stockings and follow up as needed. He had left foot surgery with Dr Elsa, ortho 11/07/2022. He had some trouble with venous status ulcers shortly afterwards but seems to be doing well,  now.    Neuropathy is reportedly stable. He continues duloxetine  30mg  daily. Dr T writes pregabalin  75mg  up to three times daily. He usually only takes this twice daily. Gait is stable. He continues to use cane.    06/06/2022 ALL: Levi [name] returns for follow up for neuropathy. He continues duloxetine  30mg  daily. He reports neuropathy is stable. No significant worsening. He is tolerating duloxetine .    He had a fall in 12/2021 and referred to PT. He reports going a few times but did not feel it was helpful. He reports continued falls. He uses four prong cane. He has no feeling of bottoms of feet. He reports being diagnosed with Charcot foot. He has a walker but doesn't like to use it.    He tells me that he first noticed discoloration around the base of toes of left foot about 3 weeks ago. No falls or known injury to cause bruising. He denies pain.    12/04/2021 ALL: Levi [name] returns for follow up for neuropathy. He was last seen by Dr Buck 08/2021 and reported more falls and gait instability. He was tolerating duloxetine  60mg  daily but felt that 30mg  worked better and dose was decreased. He feels that neuropathy seems stable. He has occasional burning. Neuropathy cream did not help much.    He reports that dizziness/lightheadedness has been a little worse over the past 6-8 weeks. Most of the time symptoms are consistent with standing from seated position. BP is normally lower. No syncope. He falls all the time. Mostly when not using He denies any significant injuries since last visit. He uses his four prong cane. Rarely uses walker. He wears compression stocking most of the time. He admits that he has not been drinking as much water  recently. Lasix  dose was increased to 40mg  end of May.    He did undergo robotic nissen fundoplication 11/01/2021. He was initially recovering well but developed more dysphagia about a week ago. He reports spitting up a little blood. Medrol  taper did not help much. He is being  scheduled for UGI this week. He last noticed blood tinged sputum about 2 days ago.    He continues to follow closely with cardiology for CHF. He lives with his wife. He is able to drive.  >> 5/88/7976: I last saw him on 09/20/2020, at which time he reported ongoing leg pain, right side more than left.  He had fallen.  He had presented to the emergency room due to presyncopal symptoms.  He was advised to start gabapentin  low-dose with gradual increase.  He was advised to use his walker rather than his cane.   He reports having had several more falls, I reviewed his chart, he has had multiple emergency room visits since December 2022.  He broke his ribs on the right side, he had sustained a skin ulcer on the right leg and it became infected, he is now under wound care.  He was supposed to have a fundoplication done with general surgery but this was indefinitely postponed as I understand because of his high risk for bleeding, he is on  Eliquis , and his high risk for falls.  He was supposed to have the Watchman procedure but it was aborted.  He is followed closely by cardiology for his congestive heart failure.  He has had significant lower extremity swelling.  He was using compression socks, his right leg is bandaged.  As far as the pain and numbness, he has discomfort but mostly numbness in his legs, it is up to his knees.  Sometimes he feels that his legs will give out.  He has not used his walker consistently but mostly uses his four-point cane.  He is again advised to use his walker at all times.  He tolerates the Cymbalta  at 60 mg strength but feels that the 30 was more effective.  He is wondering if he should go back to 30 mg.  He believes he is taking the generic duloxetine  for the 60 but may have had the brand name for the 30 mg.  For his chronic lung disease he has been on prednisone  lately, it was at 40 mg daily but he gained a lot of weight in the short period of time.  He is now on 20 mg daily.      He saw Greig Forbes, NP in the interim on 12/20/2020, at which time he was on gabapentin  300 mg at bedtime.  He was advised to increase his gabapentin  gradually to 300 mg 3 times daily.  He was also given a prescription for a compounded cream for neuropathy.   He saw Greig Forbes, NP on 04/09/2021, at which time he reported that he had stopped taking gabapentin  as it was not effective.  He did not use the compounded cream.  He declined a second prescription to retry it.  He reported blood pressure fluctuation.  He was advised to start low-dose Cymbalta  at 30 mg daily.  The Cymbalta  was increased to 60 mg daily in December 2022.     I saw him at the request of Dr. Colon on 10/21/2019, at which time the patient reported a longstanding history of numbness and tingling and pain in both lower extremities.  Symptoms were more on the right.   He had extensive blood work on 10/21/2019.  A1c was 5.5, ANA was negative, CRP was less than 1, rheumatoid factor was less than 10, CK was 59, heavy metals in the blood was negative, CMP showed glucose of 115, BUN 27, creatinine 0.88, AST 19, ALT 15, TSH was mildly elevated at 5.33, multiple myeloma panel showed decreased in immunoglobulins with a pattern suggestive of hypogammaglobulinemia, RPR nonreactive, vitamin B12 low at 224, thiamine 206.2.  He was advised to seek consultation with hematology.  He was furthermore advised to follow-up with his primary care for B12 deficiency and abnormal TSH.   He had an EMG and nerve conduction velocity test through our office on 11/17/2019 and I reviewed the results:   Conclusion: This is an abnormal study.  There is electrodiagnostic evidence of mild bilateral lumbosacral radiculopathy, mainly involving bilateral L4, L5, S1 myotomes, there is no evidence of active process.  In addition, there is evidence of mild length dependent axonal sensorimotor polyneuropathy.   We called him with his test results.  He saw hematology in July 2021,  hypogammaglobulinemia was not considered to be significant at the time.  He did not have any history of recurrent infections.  He was not felt to need a bone marrow biopsy.     10/21/19: (He) reports a longerstanding Hx of Numbness and tingling  as well as pain in both lower extremities, right more than left as well as some numbness and tingling in both hands and forearms.  He is unsure as to when his symptoms started, probably several years ago.  He has had falls.  He has felt imbalanced.  He has started using a cane but did not bring it today.  He denies a family history of neuropathy or hereditary muscle diseases, is not diabetic.  He does not drink alcohol and has not had any alcohol in years, probably 40 years. He is a non-smoker.  He tried gabapentin  some time ago for his back, he is unsure if it helped but does not recall having any side effects.  He reports that he had shingles in the past and this affected his left-sided truncal area and he tried Lyrica  for it which did not help.  He tries to hydrate well but does report mouth dryness. He had recent EMG nerve conduction velocity testing through your office on 09/27/2019, supportive of sensorimotor peripheral neuropathy with some superimposed residuals from prior lumbar radiculopathy contributing to the EMG findings.   He had a CT myelogram of the spine on 05/12/19, and I reviewed the results: IMPRESSION: Cervical spine:   1. C4-C6 ACDF with solid arthrodesis and patent canal/foramina. 2. C6-7 interval disc arthroplasty with patent canal and foramina. 3. C3-4 chronic right foraminal impingement mainly from uncovertebral spurring.   Thoracic spine:   1. T2-3 focal advanced disc degeneration with height loss causing biforaminal impingement. 2. Spondylosis and disc fissuring at T7-8 below. 3. Diffusely patent spinal canal.   Lumbar spine:   1. T12-S1 fusion. Notable vacuum phenomenon at the L4-5 disc space with right S1 pedicle screw  fracture, but definite solid posterior-lateral arthrodesis at L4-5. Solid arthrodesis is also seen at the other levels. 2. L4-5 subarticular recess narrowing on the right more than left with overall mild central stenosis.  04/21/2018: 78 year old right-handed gentleman with an underlying complex medical history of paroxysmal A. fib, COPD, reflux disease, hyperlipidemia, allergic rhinitis, coronary artery disease, gout, degenerative joint disease, anxiety, history of spinal stenosis, status post multiple surgeries including bilateral knee surgeries, foot surgeries, tonsillectomy, cholecystectomy, back surgeries, shoulder surgeries, neck surgery, and status post total hip replacement b/l, hardware in the back, who reports a sense of dizziness and intermittent nausea for the past 2-3 months. He reports intermittent vertiginous symptoms when laying in bed but has had lightheadedness when sitting and standing. He has had multiple falls, he has significant mobility issues, multiple joint related and back surgeries. He has significant degenerative disease of the entire spine, sees Dr. Colon.  I reviewed your office note from 04/10/2018, which you kindly included. He was treated for vertigo symptoms with meclizine  12.5 mg 3 times a day for about 10 days. His wife has noticed a tremor. He has a family history of Parkinson's disease on his maternal side, 2 aunts, as he recalls, no family history of essential tremor. He tried meclizine  but did not find it helpful. Of note, he is on a nebulizer as needed as well as albuterol  as needed. He is on chronic prednisone , currently 10 mg daily. He tries to hydrate well but likes to drink diet soda, estimates that he can drink half a gallon of diet orange soda. He has a family history of stroke in his father who died at the age of 2 from a heart attack but also had a stroke. Paternal grandparents had strokes. Mom died at age 65 from  diabetes complications. He has had a tremor  affecting his right more than left hand and his head and neck area for at least one year, likely longer he believes. He is retired, lives with his wife. He does not smoke, does not currently drink alcohol.  His Past Medical History Is Significant For: Past Medical History:  Diagnosis Date   Agent orange exposure 1970's   takes Imdur ,Diltiazem , and Atacand  daily   Agent orange exposure    ALLERGIC RHINITIS    Allergy     seasonal allergies   Anemia    iron  deficiency   Arthritis    Asthma    uses inhaler and nebulizers   Atrial fibrillation (HCC)    Bruises easily    d/t prednisone  daily   Cataract    Cellulitis 03/22/2020   Chest wall pain 09/21/2019   L scapula, reported 4/27 21/     CHF (congestive heart failure) (HCC)    dx-on meds to treat   Chronic bronchitis (HCC)    used to get it q yr; last time ?2008 (04/28/2012)   Clotting disorder 2019   On blood thinner when I bleed it is for a consideral amount of time. This is tegardless of size of wound.   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 2010   History of total right hip arthroplasty 07/31/2015   Hyperlipidemia    takes Lipitor daily; pt. states he takes a preventive   Hypothyroidism    Joint pain    Joint swelling    Leukocytosis    Lumbar stenosis status post T12-S1 fusion 04/07/2017    MSSA bacteremia 03/19/2023   Nausea and vomiting 11/12/2022   Neuromuscular disorder (HCC)    bilat legs, and bilat arms   Neuropathy    Osteoarthritis of left knee 01/10/2012   Osteoporosis 2012   PAF (paroxysmal atrial fibrillation) (HCC)    a. Dx 04/2012, CHA2DS2VASc = 1 (age);  b. 04/2012 Echo: EF 40-50%, mild MR.   Personal history of colonic adenomas 02/09/2013   Pneumonia    Primary osteoarthritis of right knee 06/17/2014   Sepsis (HCC) 03/18/2023   Shortness of breath dyspnea    Thyroid  disease    no taking meds-caused dizziness    His Past Surgical History Is Significant For: Past Surgical History:  Procedure Laterality Date   56 HOUR PH STUDY N/A 05/30/2021   Procedure: 24 HOUR PH STUDY;  Surgeon: San Sandor GAILS, DO;  Location: [facility];  Service: Gastroenterology;  Laterality: N/A;   AMPUTATION Right 11/14/2023   Procedure: AMPUTATION BELOW KNEE;  Surgeon: Harden Jerona GAILS, MD;  Location[facility];  Service: Orthopedics;  Laterality: Right;   AMPUTATION TOE Bilateral 08/13/2023  Procedure: LEFT SECOND TOE AMPUTATION AND RIGHT FIFTH TOE AMPUTATION;  Surgeon: Malvin Marsa FALCON, DPM;  Location: [facility] OR;  Service: Orthopedics/Podiatry;  Laterality: Bilateral;   ANTERIOR CERVICAL DECOMP/DISCECTOMY FUSION N/A 07/21/2012   Procedure: ANTERIOR CERVICAL DECOMPRESSION/DISCECTOMY FUSION 2 LEVELS;  Surgeon: Victory Gens, MD;  Location: [facility];  Service: Neurosurgery;  Laterality: N/A;  C4-5 C5-6 Anterior cervical decompression/diskectomy/fusion   ANTERIOR LAT LUMBAR FUSION N/A 04/07/2017   Procedure: Thoracic twelve-Lumbar one Anterolateral decompression/fusion;  Surgeon: Gens Victory, MD;  Location: [facility] OR;  Service: Neurosurgery;  Laterality: N/A;   ARTHRODESIS FOOT WITH WEIL OSTEOTOMY Left 11/07/2022   Procedure: LEFT THIRD METATARSOPHALANGEAL CAPSULOTOMY, WEIL OSTEOTOMY;  Surgeon: Elsa Lonni SAUNDERS, MD;  Location: [facility] OR;  Service: Orthopedics;  Laterality: Left;   LENGTH OF SURGERY: 60 MINUTES   BACK SURGERY     x 3   BELOW KNEE LEG AMPUTATION Right    June 2025   BONE EXOSTOSIS EXCISION Right 10/16/2023   Procedure: EXCISION, EXOSTOSIS;  Surgeon: Janit Thresa HERO, DPM;  Location: [facility];  Service: Orthopedics/Podiatry;  Laterality: Right;   CARDIAC CATHETERIZATION  11/2008   Dr. Jeffrie - 20% calcified non flow limiting left main, 50% EF apical hypokinesis   CARPAL TUNNEL RELEASE Bilateral    CERVICAL DISC ARTHROPLASTY N/A 04/07/2017   Procedure: Cervical six-seven Disc arthroplasty;  Surgeon: Gens Victory, MD;  [facility] OR;  Service: Neurosurgery;  Laterality: N/A;   CHEST TUBE INSERTION Left 04/11/2017   Procedure: CHEST TUBE INSERTION;  Surgeon: Fleeta Hanford Coy, MD;  Location: [facility] OR;  Service: Thoracic;  Laterality: Left;   CHOLECYSTECTOMY     COLONOSCOPY  2016   CG-MAC-miralax (good)servere TICS/TA x 2   ESOPHAGEAL MANOMETRY N/A 05/30/2021   Procedure: ESOPHAGEAL MANOMETRY (EM);  Surgeon: San Sandor GAILS, DO;  Location: [facility];  Service: Gastroenterology;  Laterality: N/A;  ph impedence   ESOPHAGOGASTRODUODENOSCOPY     EYE SURGERY Bilateral 2011   steroidal encapsulation (04/28/2012)   FOOT SURGERY Right    x 2   JOINT REPLACEMENT     Many surgeries including 5 joit replacements   KNEE ARTHROSCOPY Bilateral    LATERAL / POSTERIOR COMBINED FUSION LUMBAR SPINE  2012   LEFT ATRIAL APPENDAGE OCCLUSION N/A 02/22/2021   Procedure: LEFT ATRIAL APPENDAGE OCCLUSION;  Surgeon: Cindie Ole DASEN, MD;  [facility];  Service: Cardiovascular;  Laterality: N/A;   NEUROPLASTY / TRANSPOSITION ULNAR NERVE AT ELBOW Bilateral    POLYPECTOMY  2016   severe TICS/TA x 2   POSTERIOR CERVICAL LAMINECTOMY WITH MET- RX Right 06/11/2021   Procedure: Right Cervical six-seven  Laminectomy and foraminotomy with metrex;  Surgeon: Gens Victory, MD;  Location: [facility] OR;  Service: Neurosurgery;  Laterality: Right;   REFRACTIVE SURGERY Left  07/07/2023   laser surgery but still has floaters   REVERSE SHOULDER ARTHROPLASTY  04/28/2012   Procedure: REVERSE SHOULDER ARTHROPLASTY;  Surgeon: Eva Elsie Herring, MD;  Location[facility] OR;  Service: Orthopedics;  Laterality: Left;  Left shouder reverse total shoulder arthroplasty   SHOULDER SURGERY     SPINE SURGERY     I have had 7 spinal surgeries   TEE WITHOUT CARDIOVERSION N/A 02/22/2021   Procedure: TRANSESOPHAGEAL ECHOCARDIOGRAM (TEE);  Surgeon: Cindie Ole DASEN, MD;  Location: [facility] INVASIVE CV LAB;  Service: Cardiovascular;  Laterality: N/A;   TENDON REPAIR Right 08/07/2020   Procedure: RIGHT HAND LIGAMENT RECONSTRUCTION AND TENDON INTERPOSITION;  Surgeon: Yvone Rush, MD;  Location: [facility];  Service: Orthopedics;  Laterality: Right;   TONSILLECTOMY AND ADENOIDECTOMY  1954   TOTAL HIP ARTHROPLASTY Left 2011   left (04/28/2012)   TOTAL HIP ARTHROPLASTY Right 07/31/2015   Procedure: TOTAL HIP ARTHROPLASTY ANTERIOR APPROACH;  Surgeon: Norleen Gavel, MD;  Location: [facility] OR;  Service: Orthopedics;  Laterality: Right;   TOTAL KNEE ARTHROPLASTY  01/10/2012   Procedure: TOTAL KNEE ARTHROPLASTY;  Surgeon: Norleen LITTIE Gavel, MD;  Location: [facility] OR;  Service: Orthopedics;;  left total knee arthroplasty   TOTAL KNEE ARTHROPLASTY Right 06/17/2014   Procedure: TOTAL KNEE ARTHROPLASTY;  Surgeon: Norleen LITTIE Gavel, MD;  Location: [facility] OR;  Service: Orthopedics;  Laterality: Right;   toupet fundolplication  11/01/2021   TRANSESOPHAGEAL ECHOCARDIOGRAM (CATH LAB) N/A 03/24/2023   Procedure: TRANSESOPHAGEAL ECHOCARDIOGRAM;  Surgeon: Mona Vinie BROCKS, MD;  Location: [facility];  Service: Cardiovascular;  Laterality: N/A;   TRANSESOPHAGEAL ECHOCARDIOGRAM (CATH LAB) N/A 02/25/2024   Procedure: TRANSESOPHAGEAL ECHOCARDIOGRAM;  Surgeon: Jeffrie Oneil BROCKS, MD;  Location: [facility];  Service: Cardiovascular;  Laterality: N/A;    His Family History Is Significant For: Family History   Problem Relation Age of Onset   Diabetes Mother    Heart disease Father        Died 54, MI   Cancer Father    Hyperlipidemia Father    Stroke Father    Prostate cancer Brother    Colon polyps Brother    Diabetes Brother    Colon polyps Brother    Asthma Brother    COPD Brother    Colon cancer Brother        Dx age 19   Cancer Brother    Colon cancer Other    Esophageal cancer Neg Hx    Rectal cancer Neg Hx    Stomach cancer Neg Hx    Pancreatic cancer Neg Hx    Kidney disease Neg Hx    Liver disease Neg Hx    Neuropathy Neg Hx     His Social History Is Significant For: Social History   Socioeconomic History   Marital status: Married    Spouse name: [name]   Number of children: 1   Years of education: 16   Highest education level: Bachelor's degree (e.g., BA, AB, BS)  Occupational History   Occupation: Retired    Comment: nurse, 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 Daughter lives two miles away. 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 Enjoys being active and working.      Tobacco use, amount per day now: None   Past tobacco use, amount per day: None   How many years did you use tobacco:   Alcohol use (drinks per week): None   Diet:   Do you drink/eat things with caffeine : Coffee   Marital status:  Married                                What  year were you married? 1973   Do you live in a house, apartment, assisted living, condo, trailer, etc.? House    Is it one or more stories? 3 stories   How many persons live in  your home? 2   Do you have pets in your home?( please list) 3 [employer]    Highest Level of education completed? Engineer, Maintenance (it).   Current or past profession: Police    Do you exercise?    Yes                              Type and how often? 5 times weekly   Do you have a living will? Yes   Do you have a DNR form?     Yes                              If not, do you want to discuss one?   Do you have signed POA/HPOA forms?    Yes                    If so, please bring to you appointment      Do you have any difficulty bathing or dressing yourself?    Do you have any difficulty preparing food or eating? No   Do you have any difficulty managing your medications? No   Do you have any difficulty managing your finances? No   Do you have any difficulty affording your medications? No   Social Drivers of Corporate Investment Banker Strain: Low Risk  (03/28/2024)   Overall Financial Resource Strain (CARDIA)    Difficulty of Paying Living Expenses: Not hard at all  Food Insecurity: No Food Insecurity (03/28/2024)   Hunger Vital Sign    Worried About Running Out of Food in the Last Year: Never true    Ran Out of Food in the Last Year: Never true  Transportation Needs: No Transportation Needs (03/28/2024)   PRAPARE - Administrator, Civil Service (Medical): No    Lack of Transportation (Non-Medical): No  Recent Concern: Transportation Needs - Unmet Transportation Needs (02/05/2024)   PRAPARE - Transportation    Lack of Transportation (Medical): Yes    Lack of Transportation (Non-Medical): Yes  Physical Activity: Unknown (03/28/2024)   Exercise Vital Sign    Days of Exercise per Week: Patient declined    Minutes of Exercise per Session: Not on file  Recent Concern: Physical Activity - Inactive (02/05/2024)   Exercise Vital Sign    Days of Exercise per Week: 0 days    Minutes of Exercise per Session: Not on file  Stress: No Stress Concern Present (03/28/2024)   [employer] of  Occupational Health - Occupational Stress Questionnaire    Feeling of Stress: Only a little  Social Connections: Socially Integrated (03/28/2024)   Social Connection and Isolation Panel    Frequency of Communication with Friends and Family: More than three times a week    Frequency of Social Gatherings with Friends and Family: Three times a week    Attends Religious Services: More than 4 times per year    Active Member of Clubs or Organizations: Yes    Attends Banker Meetings: 1 to 4 times per year    Marital Status: Married    His Allergies Are:  Allergies  Allergen Reactions  Levaquin  [Levofloxacin ] Itching    Tolerated ciprofloxacin  in 2022.  :   His Current Medications Are:  Outpatient Encounter Medications as of 04/29/2024  Medication Sig   acetaminophen  (TYLENOL ) 325 MG tablet Take 1-2 tablets (325-650 mg total) by mouth every 4 (four) hours as needed for mild pain (pain score 1-3).   albuterol  (VENTOLIN  HFA) 108 (90 Base) MCG/ACT inhaler INHALE 2 PUFFS BY MOUTH EVERY 4 HOURS AS NEEDED FOR WHEEZE OR FOR SHORTNESS OF BREATH   apixaban  (ELIQUIS ) 5 MG TABS tablet Take 1 tablet (5 mg total) by mouth 2 (two) times daily.   atorvastatin  (LIPITOR) 20 MG tablet Take 1 tablet (20 mg total) by mouth daily.   cyanocobalamin  (VITAMIN B12) 1000 MCG/ML injection    cyclobenzaprine  (FLEXERIL ) 10 MG tablet Take 10 mg by mouth 3 (three) times daily as needed.   diltiazem  (CARDIZEM  CD) 180 MG 24 hr capsule Take 1 capsule (180 mg total) by mouth daily.   DULoxetine  (CYMBALTA ) 30 MG capsule Take 30 mg by mouth 2 (two) times daily.   ergocalciferol (VITAMIN D2) 1.25 MG (50000 UT) capsule Take 50,000 Units by mouth once a week.   escitalopram  (LEXAPRO ) 10 MG tablet Take 1 tablet (10 mg total) by mouth daily.   ferrous gluconate  (FERGON) 324 MG tablet Take 1 tablet (324 mg total) by mouth daily.   ipratropium-albuterol  (DUONEB) 0.5-2.5 (3) MG/3ML SOLN 1 NEBULE EVERY 6 HOURS AS NEEDED.  **J45.3**   isosorbide  mononitrate (IMDUR ) 30 MG 24 hr tablet Take 30 mg by mouth daily.   lamoTRIgine (LAMICTAL) 25 MG tablet Take by mouth.   nutrition supplement, JUVEN, (JUVEN) PACK Take 1 packet by mouth 2 (two) times daily between meals.   oxyCODONE  (OXY IR/ROXICODONE ) 5 MG immediate release tablet Take 1 tablet (5 mg total) by mouth every 4 (four) hours as needed for severe pain (pain score 7-10) or moderate pain (pain score 4-6).   pantoprazole  (PROTONIX ) 40 MG tablet Take 1 tablet (40 mg total) by mouth 2 (two) times daily before a meal.   predniSONE  (DELTASONE ) 10 MG tablet Take 1 tablet (10 mg total) by mouth daily with breakfast.   predniSONE  (DELTASONE ) 5 MG tablet Take 4 tablets (20 mg total) by mouth daily with breakfast. 3 tabs daily   pregabalin  (LYRICA ) 150 MG capsule Take 1 capsule (150 mg total) by mouth 2 (two) times daily. (Patient taking differently: Take 75-150 mg by mouth See admin instructions. Takes 150 mg in the morning and 75 in the evening)   pregabalin  (LYRICA ) 75 MG capsule Take 1 capsule (75 mg total) by mouth daily.   sulfamethoxazole -trimethoprim  (BACTRIM ) 400-80 MG tablet Take 1 tablet by mouth daily.   tamsulosin  (FLOMAX ) 0.4 MG CAPS capsule Take 1 capsule (0.4 mg total) by mouth at bedtime.   Tiotropium Bromide-Olodaterol (STIOLTO RESPIMAT ) 2.5-2.5 MCG/ACT AERS Inhale 2 puffs into the lungs daily.   allopurinol  (ZYLOPRIM ) 100 MG tablet Take 100 mg by mouth daily.   No facility-administered encounter medications on file as of 04/29/2024.  :  Review of Systems:  Out of a complete 14 point review of systems, all are reviewed and negative with the exception of these symptoms as listed below:   Review of Systems  Objective:  Neurological Exam  Physical Exam Physical Examination:   Vitals:   04/29/24 1004  BP: 101/63  Pulse: 91    General Examination: The patient is a very pleasant 78 y.o. male in no acute distress.  He appears frail and chronically  ill.  Situated in his wheelchair.   HEENT: Normocephalic, atraumatic, pupils are equal, round and reactive to light, no obvious tremor noted today.  Moderate to severe mouth dryness.  Face is symmetric.  No carotid bruits.   Chest: Clear to auscultation without wheezing, rhonchi or crackles noted.   Heart: S1+S2+0, mildly irregular, systolic murmur noted.     Abdomen: Soft, non-tender and non-distended.   Extremities: There is swelling in the left leg, right distal leg is bandaged, status post distal amputation.    Skin: Warm and dry with chronic appearing bruising and age-related changes noted.  Also mild erythema in the distal lower extremities bilaterally.   Musculoskeletal: exam reveals significant limitation in joint mobility in both hips and knees, and severe limitation in the right shoulder.    Neurologically:  Mental status: The patient is awake, alert and pays attention, able to provide his history with history supplemented by his wife who refers to her written notes and paperwork she brought.   Thought process is linear. Mood is normal and affect is normal.  Cranial nerves II - XII are as described above under HEENT exam.   Motor exam: thin bulk, global strength of 4 out of 5, significantly weaker with right grip strength.  Overall weaker on the right compared to left.  Fine motor skills globally significantly impaired.  Romberg not testable, more cerebellar testing limited due to joint mobility issues.   Sensory exam: Impaired globally. Gait, station and balance: In wheelchair.  Status post recent amputation.  I did not have him attempt to stand.     Assessment and Plan:    In summary, Levi [name] is a very pleasant 78 year old right-handed gentleman with an underlying complex medical history of asthma, A. fib, hyperlipidemia, reflux disease with hiatal hernia, diverticulitis, degenerative disc disease, COPD, allergic rhinitis, history of agent orange exposure in the 70s,  carpal tunnel syndrome, status post bilateral carpal tunnel release and ulnar release surgeries in January and February of this year, status post multiple surgeries including lumbar spine surgery, neck surgery, bilateral hip replacements, bilateral knee replacements, shoulder surgeries, status post right distal below-knee amputation, MRSA bacteremia, recent hospitalizations for pneumonia, who presents for follow-up consultation of his neuropathy, complicated by chronic pain from recent surgery, progression, peripheral artery disease, bacteremia.  He has a history of falls.  He is on multiple potentially sedating medications and multiple psychotropic/neurotrophic medications unfortunately.  Polypharmacy is a concern.  Workup with EMG and nerve conduction velocity testing confirmed evidence of chronic neuropathy.  He has had gait instability, balance issues and recurrent falls, he has multiple joint replacement surgeries and ongoing issues with significant arthritis and pain.  He has a multifactorial gait disorder, he is at high risk of falls, has had broken ribs before.   Unfortunately, situation is rather complicated.  He also saw a pain management provider recently.  He continues to take Cymbalta , Lyrica  and is on low-dose lamotrigine through outside providers.  He is advised to try to hang in there and stay well-hydrated, use his medications as instructed.  I did not recommend any new medications from my end of things, I would be happy to see him in follow-up to see how he is doing.  We discussed a referral to a neuromuscular specialist at an academic center but mutually agreed to hold off at this time as he has multiple other specialists and appointments he has to go to.  He is advised to follow-up routinely for checkup with the  nurse practitioner in 6 to 8 months in this clinic.  Unfortunately, there is not a whole lot we can add at this time.  I answered all their questions today and the patient and his wife  were in agreement.   I spent 45 minutes in total face-to-face time and in reviewing records during pre-charting, more than 50% of which was spent in counseling and coordination of care, reviewing test results, reviewing medications and treatment regimen and/or in discussing or reviewing the diagnosis of neuropathy, chronic pain, gait disorder, fall risk, polypharmacy, the prognosis and treatment options. Pertinent laboratory and imaging test results that were available during this visit with the patient were reviewed by me and considered in my medical decision making (see chart for details).

## 2024-04-30 DIAGNOSIS — Z89511 Acquired absence of right leg below knee: Secondary | ICD-10-CM | POA: Diagnosis not present

## 2024-04-30 DIAGNOSIS — I110 Hypertensive heart disease with heart failure: Secondary | ICD-10-CM | POA: Diagnosis not present

## 2024-04-30 DIAGNOSIS — I5042 Chronic combined systolic (congestive) and diastolic (congestive) heart failure: Secondary | ICD-10-CM | POA: Diagnosis not present

## 2024-04-30 DIAGNOSIS — J439 Emphysema, unspecified: Secondary | ICD-10-CM | POA: Diagnosis not present

## 2024-04-30 DIAGNOSIS — Z4789 Encounter for other orthopedic aftercare: Secondary | ICD-10-CM | POA: Diagnosis not present

## 2024-04-30 DIAGNOSIS — I480 Paroxysmal atrial fibrillation: Secondary | ICD-10-CM | POA: Diagnosis not present

## 2024-05-03 DIAGNOSIS — I5042 Chronic combined systolic (congestive) and diastolic (congestive) heart failure: Secondary | ICD-10-CM | POA: Diagnosis not present

## 2024-05-03 DIAGNOSIS — J439 Emphysema, unspecified: Secondary | ICD-10-CM | POA: Diagnosis not present

## 2024-05-03 DIAGNOSIS — Z4789 Encounter for other orthopedic aftercare: Secondary | ICD-10-CM | POA: Diagnosis not present

## 2024-05-03 DIAGNOSIS — I480 Paroxysmal atrial fibrillation: Secondary | ICD-10-CM | POA: Diagnosis not present

## 2024-05-03 DIAGNOSIS — I110 Hypertensive heart disease with heart failure: Secondary | ICD-10-CM | POA: Diagnosis not present

## 2024-05-03 DIAGNOSIS — Z89511 Acquired absence of right leg below knee: Secondary | ICD-10-CM | POA: Diagnosis not present

## 2024-05-09 ENCOUNTER — Other Ambulatory Visit: Payer: Self-pay | Admitting: Urgent Care

## 2024-05-10 ENCOUNTER — Encounter (HOSPITAL_BASED_OUTPATIENT_CLINIC_OR_DEPARTMENT_OTHER): Payer: Self-pay | Admitting: Pulmonary Disease

## 2024-05-10 ENCOUNTER — Ambulatory Visit (INDEPENDENT_AMBULATORY_CARE_PROVIDER_SITE_OTHER): Admitting: Pulmonary Disease

## 2024-05-10 VITALS — BP 140/85 | HR 82 | Temp 97.6°F | Ht 73.0 in | Wt 195.0 lb

## 2024-05-10 DIAGNOSIS — J4489 Other specified chronic obstructive pulmonary disease: Secondary | ICD-10-CM

## 2024-05-10 DIAGNOSIS — Z7952 Long term (current) use of systemic steroids: Secondary | ICD-10-CM | POA: Insufficient documentation

## 2024-05-10 DIAGNOSIS — R7881 Bacteremia: Secondary | ICD-10-CM | POA: Diagnosis not present

## 2024-05-10 DIAGNOSIS — B9562 Methicillin resistant Staphylococcus aureus infection as the cause of diseases classified elsewhere: Secondary | ICD-10-CM

## 2024-05-10 NOTE — Progress Notes (Signed)
 Subjective:   PATIENT ID: Levi [name]: male DOB: [birth year], MRN: [id number]  Chief Complaint  Patient presents with   Asthma    Reason for Visit: New patient  Mr. Levi [name] is a 78 year old male with COPD, asthma, pAF, chronic systolic heart failure, HTN, HLD, GERD, neuropathy, osteoarthritis, s/p R BKA, BPH who presents for follow-up.  Initial consult He was previously followed by Dr. Neysa for asthma. On Striverdi, daily prednisone  20 mg and PRN albuterol  and PRN Duonebs. He is currently being followed by ID for MRSA bacteremia s/p daptomycin  x 4 weeks completed on 03/22/24. With his recent infections, ID recommended decreasing prednisone ; continuing bactrim  until prednisone  is <15 mg daily.  He was hospitalized 02/21/24-02/27/24 and 02/27/24-03/12/24 for pneumonia, MRSA bacteremia and debilitation. From a pulmonary standpoint he reports he is overall doing well. He has tried to reduce his prednisone  dose but reports sweating so resumed 20 mg daily. Compliant with Striverdi and uses albuterol  1-2 times a week for shortness of breath. Occasional wheezing.   05/10/24 Since our last visit he has reduced prednisone  to 17.5 mg and reports systolic BP ranging 110s. Has recently finished Striverdi and will start Stiolto this week. Denies shortness of breath or wheezing.   Past Medical History:  Diagnosis Date   Agent orange exposure 1970's   takes Imdur ,Diltiazem , and Atacand  daily   Agent orange exposure    ALLERGIC RHINITIS    Allergy     seasonal allergies   Anemia    iron  deficiency   Arthritis    Asthma    uses inhaler and nebulizers   Atrial fibrillation (HCC)    Bruises easily    d/t prednisone  daily   Cataract    Cellulitis 03/22/2020   Chest wall pain 09/21/2019   L scapula, reported 4/27 21/     CHF (congestive heart failure) (HCC)    dx-on meds to treat   Chronic bronchitis (HCC)    used to get it q yr; last time ?2008 (04/28/2012)   Clotting  disorder 2019   On blood thinner when I bleed it is for a consideral amount of time. This is tegardless of size of wound.   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 2010   History of total right hip arthroplasty 07/31/2015   Hyperlipidemia    takes Lipitor daily; pt. states he takes a preventive   Hypothyroidism    Joint pain    Joint swelling    Leukocytosis    Lumbar stenosis status post T12-S1 fusion 04/07/2017   MSSA bacteremia 03/19/2023   Nausea and vomiting 11/12/2022   Neuromuscular disorder (HCC)    bilat legs, and bilat arms  Neuropathy    Osteoarthritis of left knee 01/10/2012   Osteoporosis 2012   PAF (paroxysmal atrial fibrillation) (HCC)    a. Dx 04/2012, CHA2DS2VASc = 1 (age);  b. 04/2012 Echo: EF 40-50%, mild MR.   Personal history of colonic adenomas 02/09/2013   Pneumonia    Primary osteoarthritis of right knee 06/17/2014   Sepsis (HCC) 03/18/2023   Shortness of breath dyspnea    Thyroid  disease    no taking meds-caused dizziness     Family History  Problem Relation Age of Onset   Diabetes Mother    Heart disease Father        Died 104, MI   Cancer Father    Hyperlipidemia Father    Stroke Father    Prostate cancer Brother    Colon polyps Brother    Diabetes Brother    Colon polyps Brother    Asthma Brother    COPD  Brother    Colon cancer Brother        Dx age 73   Cancer Brother    Colon cancer Other    Esophageal cancer Neg Hx    Rectal cancer Neg Hx    Stomach cancer Neg Hx    Pancreatic cancer Neg Hx    Kidney disease Neg Hx    Liver disease Neg Hx    Neuropathy Neg Hx      Social History   Occupational History   Occupation: Retired    Comment: nurse, 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     Outpatient Medications Prior to Visit  Medication Sig Dispense Refill   albuterol  (VENTOLIN  HFA) 108 (90 Base) MCG/ACT inhaler INHALE 2 PUFFS BY MOUTH EVERY 4 HOURS AS NEEDED FOR WHEEZE OR FOR SHORTNESS OF BREATH 8 each 4   apixaban  (ELIQUIS ) 5 MG TABS tablet Take 1 tablet (5 mg total) by mouth 2 (two) times daily. 180 tablet 1   atorvastatin  (LIPITOR) 20 MG tablet Take 1 tablet (20 mg total) by mouth daily. 90 tablet 3   cyanocobalamin  (VITAMIN B12) 1000 MCG/ML injection      diltiazem  (CARDIZEM  CD) 180 MG 24 hr capsule Take 1 capsule (180 mg total) by mouth daily. 90 capsule 3   DULoxetine  (CYMBALTA ) 30 MG capsule Take 30 mg by mouth 2 (two) times daily.     ergocalciferol (VITAMIN D2) 1.25 MG (50000 UT) capsule Take 50,000 Units by mouth once a week.     escitalopram  (LEXAPRO ) 10 MG tablet Take 1 tablet (10 mg total) by mouth daily. 90 tablet 1   ferrous gluconate  (FERGON) 324 MG tablet Take 1 tablet (324 mg total) by mouth daily. 30 tablet 0   ipratropium-albuterol  (DUONEB) 0.5-2.5 (3) MG/3ML SOLN 1 NEBULE EVERY 6 HOURS AS NEEDED. **J45.3** 270 mL 4   isosorbide  mononitrate (IMDUR ) 30 MG  24 hr tablet Take 30 mg by mouth daily.     lamoTRIgine (LAMICTAL) 25 MG tablet Take by mouth.     nutrition supplement, JUVEN, (JUVEN) PACK Take 1 packet  by mouth 2 (two) times daily between meals.     oxyCODONE  (OXY IR/ROXICODONE ) 5 MG immediate release tablet Take 1 tablet (5 mg total) by mouth every 4 (four) hours as needed for severe pain (pain score 7-10) or moderate pain (pain score 4-6). 30 tablet 0   pantoprazole  (PROTONIX ) 40 MG tablet Take 1 tablet (40 mg total) by mouth 2 (two) times daily before a meal. 60 tablet 0   predniSONE  (DELTASONE ) 10 MG tablet Take 1 tablet (10 mg total) by mouth daily with breakfast. 90 tablet 2   predniSONE  (DELTASONE ) 5 MG tablet Take 4 tablets (20 mg total) by mouth daily with breakfast. 3 tabs daily     pregabalin  (LYRICA ) 75 MG capsule Take 1 capsule (75 mg total) by mouth daily. 90 capsule 1   sulfamethoxazole -trimethoprim  (BACTRIM ) 400-80 MG tablet Take 1 tablet by mouth daily. 60 tablet 5   tamsulosin  (FLOMAX ) 0.4 MG CAPS capsule Take 1 capsule (0.4 mg total) by mouth at bedtime. 30 capsule 0   Tiotropium Bromide-Olodaterol (STIOLTO RESPIMAT ) 2.5-2.5 MCG/ACT AERS Inhale 2 puffs into the lungs daily. 4 g 5   cyclobenzaprine  (FLEXERIL ) 10 MG tablet Take 10 mg by mouth 3 (three) times daily as needed.     acetaminophen  (TYLENOL ) 325 MG tablet Take 1-2 tablets (325-650 mg total) by mouth every 4 (four) hours as needed for mild pain (pain score 1-3). (Patient not taking: Reported on 05/10/2024)     allopurinol  (ZYLOPRIM ) 100 MG tablet Take 100 mg by mouth daily. (Patient not taking: Reported on 05/10/2024)     pregabalin  (LYRICA ) 150 MG capsule Take 1 capsule (150 mg total) by mouth 2 (two) times daily. (Patient not taking: Reported on 05/10/2024) 60 capsule 2   No facility-administered medications prior to visit.    Review of Systems  Constitutional:  Negative for chills, diaphoresis, fever, malaise/fatigue and weight loss.  HENT:  Negative for  congestion.   Respiratory:  Negative for cough, hemoptysis, sputum production, shortness of breath and wheezing.   Cardiovascular:  Negative for chest pain, palpitations and leg swelling.     Objective:   Vitals:   05/10/24 0905  BP: (!) 140/85  Pulse: 82  Temp: 97.6 F (36.4 C)  SpO2: 94%  Weight: 195 lb (88.5 kg)  Height: 6' 1 (1.854 m)    SpO2: 94 %  Physical Exam: General: Well-appearing, no acute distress HENT: [place], AT Eyes: EOMI, no scleral icterus Respiratory: Clear to auscultation bilaterally.  No crackles, wheezing or rales Cardiovascular: RRR, -M/R/G, no JVD Extremities:-Edema,-tenderness Neuro: AAO x4, CNII-XII grossly intact Psych: Normal mood, normal affect   Data Reviewed:  Imaging: CT Chest 02/22/24 - Paraseptal and centrilobular emphysema. Bilateral trace/small pleural effusions L>R  PFT: 01/04/15 FVC 4.55 (86%) FEV1 3.28 (83%) Ratio 72  TLC 75% DLCO 77% Interpretation: Mild restrictive defect with mildly reduced gas exchange.  Labs: CBC    Component Value Date/Time   WBC 10.0 03/29/2024 1114   WBC 9.3 03/11/2024 0424   RBC 3.93 (L) 03/29/2024 1114   RBC 3.27 (L) 03/11/2024 0424   HGB 11.3 (L) 03/29/2024 1114   HCT 34.9 (L) 03/29/2024 1114   PLT 190 03/29/2024 1114   MCV 89 03/29/2024 1114   MCH 28.8 03/29/2024 1114   MCH 27.8 03/11/2024 0424   MCHC 32.4 03/29/2024 1114   MCHC 30.3 03/11/2024 0424   RDW 16.7 (H) 03/29/2024 1114   LYMPHSABS 0.4 (L) 03/29/2024 1114   MONOABS 0.0 (L) 02/28/2024 0412   EOSABS  0.0 03/29/2024 1114   BASOSABS 0.0 03/29/2024 1114   No peripheral eosinophila     Assessment & Plan:   Discussion: 78 year old male with COPD, asthma, pAF, chronic systolic heart failure, HTN, HLD, GERD, neuropathy, osteoarthritis, s/p R BKA, BPH who presents for follow-up. Tolerating prednisone  taper. Plan for slow taper and with close monitoring. Discussed red flags as noted below. Counseled on bronchodilators.   Assessment &  Plan On prednisone  therapy Chronic use for respiratory symptoms. At risk for adrenal suppression. Has been on meds for 14 years --Reduce prednisone  from 17.5 mg to 15 mg --Monitor blood pressure up to twice a day             >Please contact us  if systolic blood pressure <90 (the top number)             >Monitor signs of dizziness, headaches, fatigue, visual changes, lower blood pressure, shortness of breath and wheezing --CONTINUE Bactrim  400-800 ONE tablet ONCE a day >Once prednisone  is <15 mg, will stop bactrim  after 6 months Asthma-COPD overlap syndrome (HCC) --STOP Striverdi --START Stiolto TWO puffs ONCE a day --CONTINUE Albuterol  1-2 puffs AS NEEDED MRSA bacteremia Completed antibiotics --Reduce risk of pneumonia by reducing prednisone  as above --Bactrim  ppx on prednisone  therapy    Health Maintenance Immunization History  Administered Date(s) Administered   Fluad Quad(high Dose 65+) 02/17/2019   Fluad Trivalent(High Dose 65+) 04/07/2023   Fluzone Influenza virus vaccine,trivalent (IIV3), split virus 01/14/2021   H1N1 03/24/2008   INFLUENZA, HIGH DOSE SEASONAL PF 02/18/2020   Influenza Split 02/09/2009, 01/30/2011, 02/06/2012   Influenza Whole 02/09/2009, 01/25/2010, 02/24/2010   Influenza,inj,Quad PF,6+ Mos 02/08/2013, 02/28/2014, 02/13/2015, 01/22/2016, 01/30/2017, 01/22/2022   Influenza-Unspecified 02/08/2013, 02/28/2014, 02/13/2015, 01/22/2016, 01/30/2017, 01/24/2020   Novel Infuenza-h1n1-09 03/24/2008   PFIZER(Purple Top)SARS-COV-2 Vaccination 07/03/2019, 07/24/2019, 01/24/2020, 02/01/2020, 08/30/2020   Pfizer(Comirnaty)Fall Seasonal Vaccine 12 years and older 08/06/2022   Pneumococcal Conjugate-13 04/27/2015   Pneumococcal Polysaccharide-23 05/27/2002, 02/10/2009, 01/06/2013   Td 01/14/2020, 01/14/2020   Td (Adult) 05/27/2002   Tdap 01/14/2020, 05/01/2021, 11/08/2023   Unspecified SARS-COV-2 Vaccination 02/25/2022   CT Lung Screen - not qualified due to age  No  orders of the defined types were placed in this encounter.  No orders of the defined types were placed in this encounter.   Return in about 6 weeks (around 06/21/2024).  I have spent a total time of 20-minutes on the day of the appointment including chart review, data review, collecting history, coordinating care and discussing medical diagnosis and plan with the patient/family. Past medical history, allergies, medications were reviewed. Pertinent imaging, labs and tests included in this note have been reviewed and interpreted independently by me.  Kailen Name Slater Staff, MD [facility] 05/10/2024 10:55 AM

## 2024-05-10 NOTE — Assessment & Plan Note (Addendum)
 Chronic use for respiratory symptoms. At risk for adrenal suppression. Has been on meds for 14 years --Reduce prednisone  from 17.5 mg to 15 mg --Monitor blood pressure up to twice a day             >Please contact us  if systolic blood pressure <90 (the top number)             >Monitor signs of dizziness, headaches, fatigue, visual changes, lower blood pressure, shortness of breath and wheezing --CONTINUE Bactrim  400-800 ONE tablet ONCE a day >Once prednisone  is <15 mg, will stop bactrim  after 6 months

## 2024-05-10 NOTE — Assessment & Plan Note (Addendum)
 Completed antibiotics --Reduce risk of pneumonia by reducing prednisone  as above --Bactrim  ppx on prednisone  therapy

## 2024-05-10 NOTE — Assessment & Plan Note (Addendum)
--  STOP Striverdi --START Stiolto TWO puffs ONCE a day --CONTINUE Albuterol  1-2 puffs AS NEEDED

## 2024-05-10 NOTE — Patient Instructions (Addendum)
 On prednisone  therapy --Reduce prednisone  from 17.5 mg to 15 mg --Monitor blood pressure up to twice a day             >Please contact us  if systolic blood pressure <90 (the top number)             >Monitor signs of dizziness, headaches, fatigue, visual changes, lower blood pressure, shortness of breath and wheezing --CONTINUE Bactrim  400-800 ONE tablet ONCE a day >Once prednisone  is <15 mg, will stop bactrim  after 6 months  Asthma-COPD overlap syndrome (HCC) --STOP Striverdi --START Stiolto TWO puffs ONCE a day --CONTINUE Albuterol  1-2 puffs AS NEEDED

## 2024-05-13 ENCOUNTER — Other Ambulatory Visit: Payer: Self-pay

## 2024-05-13 ENCOUNTER — Ambulatory Visit: Admitting: Internal Medicine

## 2024-05-13 ENCOUNTER — Encounter: Payer: Self-pay | Admitting: Internal Medicine

## 2024-05-13 VITALS — BP 93/51 | HR 88 | Temp 97.6°F | Resp 16

## 2024-05-13 DIAGNOSIS — Z7952 Long term (current) use of systemic steroids: Secondary | ICD-10-CM | POA: Diagnosis not present

## 2024-05-13 DIAGNOSIS — B9561 Methicillin susceptible Staphylococcus aureus infection as the cause of diseases classified elsewhere: Secondary | ICD-10-CM | POA: Diagnosis present

## 2024-05-13 DIAGNOSIS — Z792 Long term (current) use of antibiotics: Secondary | ICD-10-CM | POA: Diagnosis not present

## 2024-05-13 DIAGNOSIS — Z5181 Encounter for therapeutic drug level monitoring: Secondary | ICD-10-CM

## 2024-05-13 MED ORDER — SULFAMETHOXAZOLE-TRIMETHOPRIM 400-80 MG PO TABS: 1.0000 | ORAL_TABLET | Freq: Two times a day (BID) | ORAL | 5 refills | Status: DC

## 2024-05-13 NOTE — Progress Notes (Signed)
 [facility]  Patient Active Problem List   Diagnosis Date Noted   On prednisone  therapy 05/10/2024   Malnutrition of moderate degree 03/12/2024   Acute heart failure with mildly reduced ejection fraction (HFmrEF) (HCC) 03/01/2024   Leukocytosis 03/01/2024   Debility 02/27/2024   MRSA bacteremia 02/22/2024   Left lower lobe pneumonia 02/21/2024   AKI (acute kidney injury) 02/21/2024   Hypokalemia 02/21/2024   Sacral wound, initial encounter 02/20/2024   Sepsis (HCC) 03/18/2023   Primary osteoarthritis, right shoulder 07/05/2022   BPH (benign prostatic hyperplasia) 02/21/2022   Acquired absence of right leg below knee (HCC) 01/22/2022   Peripheral neuropathy 02/22/2021   Chronic systolic heart failure (HCC) 02/22/2021   Asthma-COPD overlap syndrome (HCC) 03/21/2020   GERD (gastroesophageal reflux disease) status post Nissen fundoplication 12/03/2017   Hyperlipidemia    Paroxysmal atrial fibrillation (HCC) 05/11/2012   Asthma, moderate persistent 03/06/2009      Subjective:    Patient ID: Levi [name], male    DOB: Aug 20, 1945, 78 y.o.   MRN: [id number]  Chief Complaint  Patient presents with   Follow-up    MRSA bacteremia      HPI:  Levi [name] is a 78 y.o. male here for f/u long term bactrim  use in setting high dose glucocorticoid for copd   Patient multiple hardware Tee negative Had 4 weeks dapto finished 03/22/24 Feeling much better but still not baseline No new joint pain/back pain Right shoulder chronic pain No n/v/d No rash  Eating well   ------------- 05/13/24 id clinic fu Here with wife No complaint Down to 15 mg prednisone  daily No n/v/d/rash   Allergies  Allergen Reactions   Levaquin  [Levofloxacin ] Itching    Tolerated ciprofloxacin  in 2022.      Outpatient Medications Prior to Visit  Medication Sig Dispense Refill   albuterol  (VENTOLIN  HFA) 108 (90 Base) MCG/ACT inhaler INHALE 2 PUFFS BY MOUTH  EVERY 4 HOURS AS NEEDED FOR WHEEZE OR FOR SHORTNESS OF BREATH 8 each 4   apixaban  (ELIQUIS ) 5 MG TABS tablet Take 1 tablet (5 mg total) by mouth 2 (two) times daily. 180 tablet 1   atorvastatin  (LIPITOR) 20 MG tablet Take 1 tablet (20 mg total) by mouth daily. 90 tablet 3   cyanocobalamin  (VITAMIN B12) 1000 MCG/ML injection      diltiazem  (CARDIZEM  CD) 180 MG 24 hr capsule Take 1 capsule (180 mg total) by mouth daily. 90 capsule 3   DULoxetine  (CYMBALTA ) 30 MG capsule Take 30 mg by mouth 2 (two) times daily.     ergocalciferol (VITAMIN D2) 1.25 MG (50000 UT) capsule Take 50,000 Units by mouth once a week.     escitalopram  (LEXAPRO ) 10 MG tablet Take 1 tablet (10 mg total) by mouth daily. 90 tablet 1   ferrous gluconate  (FERGON) 324 MG tablet Take 1 tablet (324 mg total) by mouth daily. 30 tablet 0   ipratropium-albuterol  (DUONEB) 0.5-2.5 (3) MG/3ML SOLN 1 NEBULE EVERY 6 HOURS AS NEEDED. **J45.3** 270 mL 4   isosorbide  mononitrate (IMDUR ) 30 MG 24 hr tablet Take 30 mg by mouth daily.     lamoTRIgine (LAMICTAL) 25 MG tablet Take by mouth.     nutrition supplement, JUVEN, (JUVEN) PACK Take 1 packet by mouth 2 (two) times daily between meals.     oxyCODONE  (OXY IR/ROXICODONE ) 5 MG immediate release tablet Take 1 tablet (5 mg total) by mouth every 4 (four) hours as needed for severe  pain (pain score 7-10) or moderate pain (pain score 4-6). 30 tablet 0   pantoprazole  (PROTONIX ) 40 MG tablet Take 1 tablet (40 mg total) by mouth 2 (two) times daily before a meal. 60 tablet 0   predniSONE  (DELTASONE ) 10 MG tablet Take 1 tablet (10 mg total) by mouth daily with breakfast. 90 tablet 2   predniSONE  (DELTASONE ) 5 MG tablet Take 4 tablets (20 mg total) by mouth daily with breakfast. 3 tabs daily     pregabalin  (LYRICA ) 75 MG capsule Take 1 capsule (75 mg total) by mouth daily. 90 capsule 1   sulfamethoxazole -trimethoprim  (BACTRIM ) 400-80 MG tablet Take 1 tablet by mouth daily. 60 tablet 5   tamsulosin  (FLOMAX )  0.4 MG CAPS capsule Take 1 capsule (0.4 mg total) by mouth at bedtime. 30 capsule 0   Tiotropium Bromide-Olodaterol (STIOLTO RESPIMAT ) 2.5-2.5 MCG/ACT AERS Inhale 2 puffs into the lungs daily. 4 g 5   acetaminophen  (TYLENOL ) 325 MG tablet Take 1-2 tablets (325-650 mg total) by mouth every 4 (four) hours as needed for mild pain (pain score 1-3). (Patient not taking: Reported on 05/10/2024)     allopurinol  (ZYLOPRIM ) 100 MG tablet Take 100 mg by mouth daily. (Patient not taking: Reported on 05/10/2024)     cyclobenzaprine  (FLEXERIL ) 10 MG tablet TAKE 1 TABLET BY MOUTH THREE TIMES A DAY AS NEEDED FOR MUSCLE SPASMS 90 tablet 3   pregabalin  (LYRICA ) 150 MG capsule Take 1 capsule (150 mg total) by mouth 2 (two) times daily. (Patient not taking: Reported on 05/10/2024) 60 capsule 2   No facility-administered medications prior to visit.     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 Daughter lives two miles away. 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 Enjoys being active and working.      Tobacco use, amount per day now: None   Past tobacco use, amount per day: None   How many years did you use tobacco:   Alcohol use (drinks per week): None   Diet:   Do you drink/eat things with caffeine :  Coffee   Marital status:  Married                                What year were you married? 1973   Do you live in a house, apartment, assisted living, condo, trailer, etc.? House    Is it one or more stories? 3 stories   How many persons live in your home? 2   Do you have pets in your home?( please list) 3 [employer]    Highest Level of education completed? Engineer, Maintenance (it).   Current or past profession:  Police    Do you exercise?    Yes                              Type and how often? 5 times weekly   Do you have a living will? Yes   Do you have a DNR form?     Yes                              If not, do you want to discuss one?   Do you have signed POA/HPOA forms?    Yes                    If so, please bring to you appointment      Do you have any difficulty bathing or dressing yourself?    Do you have any difficulty preparing food or eating? No   Do you have any difficulty managing your medications? No   Do you have any difficulty managing your finances? No   Do you have any difficulty affording your medications? No   Social Drivers of Health   Tobacco Use: Low Risk (05/13/2024)   Patient History    Smoking Tobacco Use: Never    Smokeless Tobacco Use: Never    Passive Exposure: Past  Financial Resource Strain: Low Risk (03/28/2024)   Overall Financial Resource Strain (CARDIA)    Difficulty of Paying Living Expenses: Not hard at all  Food Insecurity: No Food Insecurity (03/28/2024)   Epic    Worried About Programme Researcher, Broadcasting/film/video in the Last Year: Never true    Ran Out of Food in the Last Year: Never true  Transportation Needs: No Transportation Needs (03/28/2024)   Epic    Lack of Transportation (Medical): No    Lack of Transportation (Non-Medical): No  Recent Concern: Transportation Needs - Unmet Transportation Needs (02/05/2024)   Epic    Lack of Transportation (Medical): Yes    Lack of Transportation (Non-Medical): Yes  Physical Activity: Unknown (03/28/2024)   Exercise Vital  Sign    Days of Exercise per Week: Patient declined    Minutes of Exercise per Session: Not on file  Recent Concern: Physical Activity - Inactive (02/05/2024)   Exercise Vital Sign    Days of Exercise per Week: 0 days    Minutes of Exercise per Session: Not on file  Stress: No Stress Concern Present (03/28/2024)   [employer] of Occupational Health - Occupational Stress Questionnaire    Feeling of Stress: Only a little  Social Connections: Socially Integrated (03/28/2024)   Social Connection and Isolation Panel    Frequency of Communication with Friends and Family: More than three times a week    Frequency of Social Gatherings with Friends and Family: Three times a week    Attends Religious Services: More than 4 times per year    Active Member of Clubs or Organizations: Yes    Attends Banker Meetings: 1 to 4 times per year    Marital Status: Married  Catering Manager Violence: Not At Risk (02/22/2024)   Epic    Fear of Current or Ex-Partner: No    Emotionally Abused: No    Physically Abused: No    Sexually Abused: No  Depression (PHQ2-9): High Risk (02/09/2024)   Depression (PHQ2-9)    PHQ-2 Score: 13  Alcohol Screen: Low Risk (03/28/2024)  Alcohol Screen    Last Alcohol Screening Score (AUDIT): 0  Housing: Low Risk (03/28/2024)   Epic    Unable to Pay for Housing in the Last Year: No    Number of Times Moved in the Last Year: 0    Homeless in the Last Year: No  Utilities: Not At Risk (02/22/2024)   Epic    Threatened with loss of utilities: No  Health Literacy: Adequate Health Literacy (10/23/2023)   B1300 Health Literacy    Frequency of need for help with medical instructions: Never      Review of Systems    All other ros negative  Objective:    There were no vitals taken for this visit. Nursing note and vital signs reviewed.  Physical Exam     General/constitutional: no distress, pleasant; moon facie HEENT: Normocephalic, PER, Conj Clear, EOMI,  Oropharynx clear Neck supple CV: rrr no mrg Lungs: clear to auscultation, normal respiratory effort Abd: Soft, Nontender Ext: no edema Skin: No Rash; skin appears fragile/thin; scattered echymoses on upper ext; skin maceration left hand Neuro: nonfocal  Labs: Lab Results  Component Value Date   WBC 10.0 03/29/2024   HGB 11.3 (L) 03/29/2024   HCT 34.9 (L) 03/29/2024   MCV 89 03/29/2024   PLT 190 03/29/2024   Last metabolic panel Lab Results  Component Value Date   GLUCOSE 103 (H) 03/29/2024   NA 139 03/29/2024   K 4.6 03/29/2024   CL 102 03/29/2024   CO2 22 03/29/2024   BUN 19 03/29/2024   CREATININE 0.89 03/29/2024   EGFR 89 03/29/2024   CALCIUM  9.1 03/29/2024   PHOS 3.4 12/28/2023   PROT 4.7 (L) 03/08/2024   ALBUMIN 2.2 (L) 03/08/2024   LABGLOB 1.9 01/01/2024   AGRATIO 1.6 12/03/2019   BILITOT 0.5 03/08/2024   ALKPHOS 72 03/08/2024   AST 17 03/08/2024   ALT 16 03/08/2024   ANIONGAP 11 03/12/2024    Micro:  Serology:  Imaging:  Assessment & Plan:   Problem List Items Addressed This Visit   None Visit Diagnoses       Current chronic use of systemic steroids    -  Primary     Therapeutic drug monitoring       Relevant Orders   CBC   COMPLETE METABOLIC PANEL WITHOUT GFR     Long term (current) use of antibiotics       Relevant Orders   CBC   COMPLETE METABOLIC PANEL WITHOUT GFR         No orders of the defined types were placed in this encounter.    Assessment: 78 yo old male afib on eliquis , HFrEF, copd, hx bka, stage 1-2 sacral decub, neuropathy, extensive hardware presence (knees, hips, left ankle, left shoulder, entire back), admitted for a week of progressive weakness and a couple days fever, chill, found to have mrsa bsi     Of note, he appears to be on chronic prednisone  for ?copd 20 mg daily   Has had chronic intermittent dry cough since beginning of August unchanged was treated for hcap sometimes in August that didn't affect the  cough. Not hypoxic   Cxr probably left lower lobe attelectasis   Do not feel cap present     With mrsa will need to see if pulm emboli an issue but no particular need to get chest ct now. Tte and tee will be needed to r/o endocarditis     [place] area no new pain but over  the next few days would benefit from repeat questioning to see if further investigation needed   At least 4 weeks abx will be needed if tte/tee negative and no obvious active bone/joint infection   9/27 bcx 2 of 2 set with mrsa       With pred use will get chest ct to see if something else amock like pjp, or emboli related to mrsa bsi    ------------------- 03/30/24 id clinic assessment Copd on chronic prednisone  high dose 20 mg daily -- he does have a pulmonologist (will be seeing dr Kassie in follow up 4 weeks daptomycin  finished 10/27 He had some other cellulitis/abscess on the buttock and had 10 days augmentin  as well during recent admission 02/23/24  Admission TEE was negative for valvular sign of endocarditis   Patient still weak overall but significantly better -- 70%  No new joint pain/back pain Appetite is good Right shoulder chronic pain stable 4/10 worse at 7/10 and again this has been baseline -- he was slotted to have surgery/hardware in the right shoulder but will need to monitor off antibiotics for 6 months before considering that    -advise patient to discuss with pulmonology to see if he can go down on prednisone  -continue for now bactrim  prophylaxis as long as prednisone  is > 15 mg per day -once prednisone  can be titrated to below 15 mg, the bactrim  can be discontinued about 6 months after -discussed sign of relapse including new bone/joint pain, fever, chill, increasing malaise, confusion -f/u 6 weeks with labs with me    --------------- 05/13/24 id clinic assessment I reviewed pulmonology progress note Doing good; bactrim  down to 15 mg daily No complaint today Compliant with  bactrim  No n/v/d rash  -labs today abx long term side effect monitoring -once prednisone  < 15 mg a day can stop bactrim  after 6 months -f/u 3 months   Follow-up: Return in about 3 months (around 08/11/2024).      Constance ONEIDA Passer, MD [facility] 05/13/2024, 11:01 AM

## 2024-05-13 NOTE — Patient Instructions (Signed)
 Let's check blood test today for side effect monitoring  Continue bactrim  1 tablet a day; will plan 6 months starting when you take prednisone  < 15 mg a day  Refill given    See us  again in 3 months

## 2024-05-14 LAB — COMPLETE METABOLIC PANEL WITHOUT GFR
AG Ratio: 2 (calc) (ref 1.0–2.5)
ALT: 14 U/L (ref 9–46)
AST: 11 U/L (ref 10–35)
Albumin: 3.4 g/dL — ABNORMAL LOW (ref 3.6–5.1)
Alkaline phosphatase (APISO): 103 U/L (ref 35–144)
BUN: 23 mg/dL (ref 7–25)
CO2: 28 mmol/L (ref 20–32)
Calcium: 8.7 mg/dL (ref 8.6–10.3)
Chloride: 105 mmol/L (ref 98–110)
Creat: 0.77 mg/dL (ref 0.70–1.28)
Globulin: 1.7 g/dL — ABNORMAL LOW (ref 1.9–3.7)
Glucose, Bld: 102 mg/dL — ABNORMAL HIGH (ref 65–99)
Potassium: 4.9 mmol/L (ref 3.5–5.3)
Sodium: 142 mmol/L (ref 135–146)
Total Bilirubin: 0.5 mg/dL (ref 0.2–1.2)
Total Protein: 5.1 g/dL — ABNORMAL LOW (ref 6.1–8.1)

## 2024-05-14 LAB — CBC
HCT: 31.4 % — ABNORMAL LOW (ref 39.4–51.1)
Hemoglobin: 9.6 g/dL — ABNORMAL LOW (ref 13.2–17.1)
MCH: 27.4 pg (ref 27.0–33.0)
MCHC: 30.6 g/dL — ABNORMAL LOW (ref 31.6–35.4)
MCV: 89.5 fL (ref 81.4–101.7)
MPV: 12.8 fL — ABNORMAL HIGH (ref 7.5–12.5)
Platelets: 207 Thousand/uL (ref 140–400)
RBC: 3.51 Million/uL — ABNORMAL LOW (ref 4.20–5.80)
RDW: 15.9 % — ABNORMAL HIGH (ref 11.0–15.0)
WBC: 9.8 Thousand/uL (ref 3.8–10.8)

## 2024-05-21 ENCOUNTER — Encounter (HOSPITAL_BASED_OUTPATIENT_CLINIC_OR_DEPARTMENT_OTHER): Payer: Self-pay | Admitting: Pulmonary Disease

## 2024-05-31 ENCOUNTER — Telehealth: Payer: Self-pay

## 2024-05-31 NOTE — Telephone Encounter (Signed)
 They are wanting verbal orders for skilled nursing and OT.

## 2024-05-31 NOTE — Telephone Encounter (Signed)
 Copied from CRM [id number]. Topic: Clinical - Home Health Verbal Orders >> May 31, 2024 11:19 AM Mercer PEDLAR wrote: Caller/Agency: [facility] Callback Number: [phone] Service Requested: Skilled Nursing Frequency: 1 week 8 Any new concerns about the patient? No

## 2024-05-31 NOTE — Telephone Encounter (Signed)
 Copied from CRM [id number]. Topic: Clinical - Home Health Verbal Orders >> May 31, 2024 12:04 PM Rosaria A wrote: Caller/Agency: [facility]  Callback Number: [phone]. Ok to leave a recruitment consultant.  Service Requested: Occupational Therapy Frequency: 1 x 8 Any new concerns about the patient? No. Patient has a wound on right leg, has been going on for a month. Nurse is seeing patient today.

## 2024-05-31 NOTE — Telephone Encounter (Signed)
 noted

## 2024-06-01 ENCOUNTER — Telehealth: Payer: Self-pay

## 2024-06-01 DIAGNOSIS — L98491 Non-pressure chronic ulcer of skin of other sites limited to breakdown of skin: Secondary | ICD-10-CM

## 2024-06-01 NOTE — Telephone Encounter (Signed)
 Copied from CRM [id number]. Topic: Clinical - Medical Advice >> Jun 01, 2024  9:09 AM Rosaria A wrote: Reason for CRM: Nat home health nurse is calling to see if a referral can be placed for the patient to the wound care center. The patients wound is not healing. Wound is located on the right outer leg. Nat would like for the PCP nurse to give her a call back. Can leave a secure voice mail.  [phone]

## 2024-06-01 NOTE — Telephone Encounter (Signed)
 Okay for skilled nursing and OT. Last I knew, his wound had closed. If still open, we may need to consider formal wound care referral. Please advise

## 2024-06-02 NOTE — Telephone Encounter (Signed)
 Copied from CRM [id number]. Topic: Referral - Question >> Jun 01, 2024  4:41 PM Mercer PEDLAR wrote: Reason for CRM: [name] (wife) stating that referral was supposed to be [facility] [place] but was sent to [facility] by mistake. She is requesting to have the referral resent to [facility] in [place].

## 2024-06-02 NOTE — Telephone Encounter (Signed)
 Home health advised of recommendations.

## 2024-06-17 ENCOUNTER — Ambulatory Visit
Admission: EM | Admit: 2024-06-17 | Discharge: 2024-06-17 | Disposition: A | Discharge status: Home / Self Care | Attending: Emergency Medicine | Admitting: Emergency Medicine

## 2024-06-17 ENCOUNTER — Ambulatory Visit

## 2024-06-17 DIAGNOSIS — J189 Pneumonia, unspecified organism: Secondary | ICD-10-CM | POA: Diagnosis not present

## 2024-06-17 DIAGNOSIS — R0789 Other chest pain: Secondary | ICD-10-CM | POA: Diagnosis not present

## 2024-06-17 DIAGNOSIS — R059 Cough, unspecified: Secondary | ICD-10-CM

## 2024-06-17 DIAGNOSIS — R0602 Shortness of breath: Secondary | ICD-10-CM

## 2024-06-17 DIAGNOSIS — R051 Acute cough: Secondary | ICD-10-CM

## 2024-06-17 LAB — POC COVID19/FLU A&B COMBO
Covid Antigen, POC: NEGATIVE
Influenza A Antigen, POC: NEGATIVE
Influenza B Antigen, POC: NEGATIVE

## 2024-06-17 MED ORDER — DOXYCYCLINE HYCLATE 100 MG PO CAPS: 100.0000 mg | ORAL_CAPSULE | Freq: Two times a day (BID) | ORAL | 0 refills | Status: DC

## 2024-06-17 MED ORDER — BENZONATATE 100 MG PO CAPS: 100.0000 mg | ORAL_CAPSULE | Freq: Three times a day (TID) | ORAL | 0 refills | Status: AC

## 2024-06-17 MED ORDER — AZITHROMYCIN 250 MG PO TABS: ORAL_TABLET | ORAL | 0 refills | Status: DC

## 2024-06-17 MED ORDER — AMOXICILLIN-POT CLAVULANATE 875-125 MG PO TABS: 1.0000 | ORAL_TABLET | Freq: Two times a day (BID) | ORAL | 0 refills | Status: DC

## 2024-06-17 NOTE — ED Triage Notes (Signed)
 Pt c/o cough and SOB x 2 days. Also c/o LT sided rib pain since straining it while trying to turn over in bed. Afrin prn. Hx of bronchitis and pneumonia.

## 2024-06-17 NOTE — ED Provider Notes (Signed)
 " [name] C[facility]    CSN: [id number] Arrival date & time: 06/17/24  0816      History   Chief Complaint Chief Complaint  Patient presents with   Cough   Shortness of Breath    HPI Levi [name] is a 79 y.o. male.   Patient presents with spouse for concerns for cough that began approximately 4 to 5 days ago.  Patient reports that he has become short of breath over the last 2 days.  Patient states that he is having some left-sided rib pain that seems to be worse with movement and coughing that has developed over the last day.  Patient reports that he has been using Afrin as needed.  Denies any other medications for symptoms.  Patient does report a history of asthma, COPD, bronchitis, and pneumonia.   Patient reports that he was recently switched to Stiolto inhaler daily, but states that this was not working so he switched back to Striverdi inhaler daily.  Patient reports that he has been using this daily.  Patient reports that he has been using his albuterol  inhaler as needed with minimal relief more frequently over the last few days.  Patient also reports that he does take 20 mg of prednisone  once daily on a regular basis.  Also of note patient is on Bactrim  chronically once daily for ongoing concerns for MRSA.  The history is provided by the patient, the spouse and medical records.  Cough Associated symptoms: shortness of breath   Shortness of Breath Associated symptoms: cough     Past Medical History:  Diagnosis Date   Agent orange exposure 1970's   takes Imdur ,Diltiazem , and Atacand  daily   Agent orange exposure    ALLERGIC RHINITIS    Allergy     seasonal allergies   Anemia    iron  deficiency   Arthritis    Asthma    uses inhaler and nebulizers   Atrial fibrillation (HCC)    Bruises easily    d/t prednisone  daily   Cataract    Cellulitis 03/22/2020   Chest wall pain 09/21/2019   L scapula, reported 4/27 21/     CHF (congestive heart failure) (HCC)    dx-on  meds to treat   Chronic bronchitis (HCC)    used to get it q yr; last time ?2008 (04/28/2012)   Clotting disorder 2019   On blood thinner when I bleed it is for a consideral amount of time. This is tegardless of size of wound.   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 2010   History of total right hip arthroplasty 07/31/2015   Hyperlipidemia    takes Lipitor daily; pt. states he takes a preventive  Hypothyroidism    Joint pain    Joint swelling    Leukocytosis    Lumbar stenosis status post T12-S1 fusion 04/07/2017   MSSA bacteremia 03/19/2023   Nausea and vomiting 11/12/2022   Neuromuscular disorder (HCC)    bilat legs, and bilat arms   Neuropathy    Osteoarthritis of left knee 01/10/2012   Osteoporosis 2012   PAF (paroxysmal atrial fibrillation) (HCC)    a. Dx 04/2012, CHA2DS2VASc = 1 (age);  b. 04/2012 Echo: EF 40-50%, mild MR.   Personal history of colonic adenomas 02/09/2013   Pneumonia    Primary osteoarthritis of right knee 06/17/2014   Sepsis (HCC) 03/18/2023   Shortness of breath dyspnea    Thyroid  disease    no taking meds-caused dizziness    Patient Active Problem List   Diagnosis Date Noted   On prednisone  therapy 05/10/2024   Malnutrition of moderate degree 03/12/2024   Acute heart failure with mildly reduced ejection fraction  (HFmrEF) (HCC) 03/01/2024   Leukocytosis 03/01/2024   Debility 02/27/2024   MRSA bacteremia 02/22/2024   Left lower lobe pneumonia 02/21/2024   AKI (acute kidney injury) 02/21/2024   Hypokalemia 02/21/2024   Sacral wound, initial encounter 02/20/2024   Sepsis (HCC) 03/18/2023   Primary osteoarthritis, right shoulder 07/05/2022   BPH (benign prostatic hyperplasia) 02/21/2022   Acquired absence of right leg below knee (HCC) 01/22/2022   Peripheral neuropathy 02/22/2021   Chronic systolic heart failure (HCC) 02/22/2021   Asthma-COPD overlap syndrome (HCC) 03/21/2020   GERD (gastroesophageal reflux disease) status post Nissen fundoplication 12/03/2017   Hyperlipidemia    Paroxysmal atrial fibrillation (HCC) 05/11/2012   Asthma, moderate persistent 03/06/2009    Past Surgical History:  Procedure Laterality Date   62 HOUR PH STUDY N/A 05/30/2021   Procedure: 24 HOUR PH STUDY;  Surgeon: San Sandor GAILS, DO;  Location: [facility];  Service: Gastroenterology;  Laterality: N/A;   AMPUTATION Right 11/14/2023   Procedure: AMPUTATION BELOW KNEE;  Surgeon: Harden Jerona GAILS, MD;  Location: [facility] OR;  Service: Orthopedics;  Laterality: Right;   AMPUTATION TOE Bilateral 08/13/2023   Procedure: LEFT SECOND TOE AMPUTATION AND RIGHT FIFTH TOE AMPUTATION;  Surgeon: Malvin Marsa FALCON, DPM;  Location: [facility] OR;  Service: Orthopedics/Podiatry;  Laterality: Bilateral;   ANTERIOR CERVICAL DECOMP/DISCECTOMY FUSION N/A 07/21/2012   Procedure: ANTERIOR CERVICAL DECOMPRESSION/DISCECTOMY FUSION 2 LEVELS;  Surgeon: Victory Gens, MD;  Location: [facility];  Service: Neurosurgery;  Laterality: N/A;  C4-5 C5-6 Anterior cervical decompression/diskectomy/fusion   ANTERIOR LAT LUMBAR FUSION N/A 04/07/2017   Procedure: Thoracic twelve-Lumbar one Anterolateral decompression/fusion;  Surgeon: Gens Victory, MD;  Location: [facility];  Service: Neurosurgery;  Laterality: N/A;   ARTHRODESIS FOOT WITH WEIL OSTEOTOMY Left  11/07/2022   Procedure: LEFT THIRD METATARSOPHALANGEAL CAPSULOTOMY, WEIL OSTEOTOMY;  Surgeon: Elsa Lonni SAUNDERS, MD;  Location: [facility] OR;  Service: Orthopedics;  Laterality: Left;  LENGTH OF SURGERY: 60 MINUTES   BACK SURGERY     x 3   BELOW KNEE LEG AMPUTATION Right    June 2025   BONE EXOSTOSIS EXCISION Right 10/16/2023   Procedure: EXCISION, EXOSTOSIS;  Surgeon: Janit Thresa HERO, DPM;  Location: [facility];  Service: Orthopedics/Podiatry;  Laterality: Right;   CARDIAC CATHETERIZATION  11/2008   Dr. Jeffrie - 20% calcified non flow limiting left main, 50% EF apical hypokinesis   CARPAL TUNNEL RELEASE Bilateral    CERVICAL DISC ARTHROPLASTY N/A 04/07/2017   Procedure: Cervical six-seven Disc arthroplasty;  Surgeon: Gens Victory, MD;  Location:  [facility];  Service: Neurosurgery;  Laterality: N/A;   CHEST TUBE INSERTION Left 04/11/2017   Procedure: CHEST TUBE INSERTION;  Surgeon: Fleeta Hanford Coy, MD;  Location: [facility] OR;  Service: Thoracic;  Laterality: Left;   CHOLECYSTECTOMY     COLONOSCOPY  2016   CG-MAC-miralax (good)servere TICS/TA x 2   ESOPHAGEAL MANOMETRY N/A 05/30/2021   Procedure: ESOPHAGEAL MANOMETRY (EM);  Surgeon: San Sandor GAILS, DO;  Location: [facility];  Service: Gastroenterology;  Laterality: N/A;  ph impedence   ESOPHAGOGASTRODUODENOSCOPY     EYE SURGERY Bilateral 2011   steroidal encapsulation (04/28/2012)   FOOT SURGERY Right    x 2   JOINT REPLACEMENT     Many surgeries including 5 joit replacements   KNEE ARTHROSCOPY Bilateral    LATERAL / POSTERIOR COMBINED FUSION LUMBAR SPINE  2012   LEFT ATRIAL APPENDAGE OCCLUSION N/A 02/22/2021   Procedure: LEFT ATRIAL APPENDAGE OCCLUSION;  Surgeon: Cindie Ole DASEN, MD;  [facility]: [facility];  Service: Cardiovascular;  Laterality: N/A;   NEUROPLASTY / TRANSPOSITION ULNAR NERVE AT ELBOW Bilateral    POLYPECTOMY  2016   severe TICS/TA x 2   POSTERIOR CERVICAL LAMINECTOMY WITH MET- RX Right 06/11/2021   Procedure: Right  Cervical six-seven  Laminectomy and foraminotomy with metrex;  Surgeon: Colon Shove, MD;  Location: [facility];  Service: Neurosurgery;  Laterality: Right;   REFRACTIVE SURGERY Left 07/07/2023   laser surgery but still has floaters   REVERSE SHOULDER ARTHROPLASTY  04/28/2012   Procedure: REVERSE SHOULDER ARTHROPLASTY;  Surgeon: Eva Elsie Herring, MD;  Location: [facility] OR;  Service: Orthopedics;  Laterality: Left;  Left shouder reverse total shoulder arthroplasty   SHOULDER SURGERY     SPINE SURGERY     I have had 7 spinal surgeries   TEE WITHOUT CARDIOVERSION N/A 02/22/2021   Procedure: TRANSESOPHAGEAL ECHOCARDIOGRAM (TEE);  Surgeon: Cindie Ole DASEN, MD;  Location: [facility];  Service: Cardiovascular;  Laterality: N/A;   TENDON REPAIR Right 08/07/2020   Procedure: RIGHT HAND LIGAMENT RECONSTRUCTION AND TENDON INTERPOSITION;  Surgeon: Yvone Rush, MD;  Location: [facility];  Service: Orthopedics;  Laterality: Right;   TONSILLECTOMY AND ADENOIDECTOMY  1954   TOTAL HIP ARTHROPLASTY Left 2011   left (04/28/2012)   TOTAL HIP ARTHROPLASTY Right 07/31/2015   Procedure: TOTAL HIP ARTHROPLASTY ANTERIOR APPROACH;  Surgeon: Rush Yvone, MD;  Location: [facility];  Service: Orthopedics;  Laterality: Right;   TOTAL KNEE ARTHROPLASTY  01/10/2012   Procedure: TOTAL KNEE ARTHROPLASTY;  Surgeon: Rush LITTIE Yvone, MD;  Location: [facility];  Service: Orthopedics;;  left total knee arthroplasty   TOTAL KNEE ARTHROPLASTY Right 06/17/2014   Procedure: TOTAL KNEE ARTHROPLASTY;  Surgeon: Rush LITTIE Yvone, MD;  Location: [facility];  Service: Orthopedics;  Laterality: Right;   toupet fundolplication  11/01/2021   TRANSESOPHAGEAL ECHOCARDIOGRAM (CATH LAB) N/A 03/24/2023   Procedure: TRANSESOPHAGEAL ECHOCARDIOGRAM;  Surgeon: Mona Vinie BROCKS, MD;  [facility]: [facility];  Service: Cardiovascular;  Laterality: N/A;   TRANSESOPHAGEAL ECHOCARDIOGRAM (CATH LAB) N/A 02/25/2024   Procedure: TRANSESOPHAGEAL  ECHOCARDIOGRAM;  Surgeon: Jeffrie Oneil BROCKS, MD;  Location: [facility];  Service: Cardiovascular;  Laterality: N/A;       Home Medications    Prior to Admission medications  Medication Sig Start Date End Date Taking? Authorizing Provider  benzonatate  (TESSALON ) 100 MG capsule Take 1 capsule (100 mg total) by mouth every 8 (eight) hours. 06/17/24  Yes Johnie, Kaesyn Johnston A, NP  doxycycline  (VIBRAMYCIN ) 100 MG  capsule Take 1 capsule (100 mg total) by mouth 2 (two) times daily. 06/17/24  Yes Johnie Flaming A, NP  acetaminophen  (TYLENOL ) 325 MG tablet Take 1-2 tablets (325-650 mg total) by mouth every 4 (four) hours as needed for mild pain (pain score 1-3). Patient not taking: Reported on 05/10/2024 03/12/24   Love, Sharlet RAMAN, PA-C  albuterol  (VENTOLIN  HFA) 108 (90 Base) MCG/ACT inhaler INHALE 2 PUFFS BY MOUTH EVERY 4 HOURS AS NEEDED FOR WHEEZE OR FOR SHORTNESS OF BREATH 01/19/24   Neysa Rama D, MD  allopurinol  (ZYLOPRIM ) 100 MG tablet Take 100 mg by mouth daily. Patient not taking: Reported on 05/10/2024    [provider]  apixaban  (ELIQUIS ) 5 MG TABS tablet Take 1 tablet (5 mg total) by mouth 2 (two) times daily. 02/02/24   Jeffrie Oneil BROCKS, MD  atorvastatin  (LIPITOR) 20 MG tablet Take 1 tablet (20 mg total) by mouth daily. 12/03/23   Jeffrie Oneil BROCKS, MD  cyanocobalamin  (VITAMIN B12) 1000 MCG/ML injection  04/08/24   [provider]  cyclobenzaprine  (FLEXERIL ) 10 MG tablet TAKE 1 TABLET BY MOUTH THREE TIMES A DAY AS NEEDED FOR MUSCLE SPASMS 05/10/24   Crain, Whitney L, PA  diltiazem  (CARDIZEM  CD) 180 MG 24 hr capsule Take 1 capsule (180 mg total) by mouth daily. 08/25/23   Jeffrie Oneil BROCKS, MD  DULoxetine  (CYMBALTA ) 30 MG capsule Take 30 mg by mouth 2 (two) times daily.    [provider]  ergocalciferol (VITAMIN D2) 1.25 MG (50000 UT) capsule Take 50,000 Units by mouth once a week.    [provider]  escitalopram  (LEXAPRO ) 10 MG tablet Take 1 tablet (10 mg  total) by mouth daily. 03/29/24   Crain, Whitney L, PA  ferrous gluconate  (FERGON) 324 MG tablet Take 1 tablet (324 mg total) by mouth daily. 03/12/24   Love, Sharlet RAMAN, PA-C  ipratropium-albuterol  (DUONEB) 0.5-2.5 (3) MG/3ML SOLN 1 NEBULE EVERY 6 HOURS AS NEEDED. **J45.3** 03/22/20   Young, Rama D, MD  isosorbide  mononitrate (IMDUR ) 30 MG 24 hr tablet Take 30 mg by mouth daily.    [provider]  lamoTRIgine (LAMICTAL) 25 MG tablet Take by mouth. 04/08/24   [provider]  nutrition supplement, JUVEN, (JUVEN) PACK Take 1 packet by mouth 2 (two) times daily between meals. 03/12/24   Love, Sharlet RAMAN, PA-C  oxyCODONE  (OXY IR/ROXICODONE ) 5 MG immediate release tablet Take 1 tablet (5 mg total) by mouth every 4 (four) hours as needed for severe pain (pain score 7-10) or moderate pain (pain score 4-6). 03/12/24   Love, Sharlet RAMAN, PA-C  pantoprazole  (PROTONIX ) 40 MG tablet Take 1 tablet (40 mg total) by mouth 2 (two) times daily before a meal. 03/12/24   Love, Sharlet RAMAN, PA-C  predniSONE  (DELTASONE ) 10 MG tablet Take 1 tablet (10 mg total) by mouth daily with breakfast. 04/02/24   Kassie Acquanetta Bradley, MD  predniSONE  (DELTASONE ) 5 MG tablet Take 4 tablets (20 mg total) by mouth daily with breakfast. 3 tabs daily 03/12/24   Love, Pamela S, PA-C  pregabalin  (LYRICA ) 150 MG capsule Take 1 capsule (150 mg total) by mouth 2 (two) times daily. Patient not taking: Reported on 05/10/2024 01/15/24   Urbano Albright, MD  pregabalin  (LYRICA ) 75 MG capsule Take 1 capsule (75 mg total) by mouth daily. 03/29/24   Crain, Whitney L, PA  sulfamethoxazole -trimethoprim  (BACTRIM ) 400-80 MG tablet Take 1 tablet by mouth 2 (two) times daily. 05/13/24 11/09/24  Overton Constance DASEN, MD  tamsulosin  (FLOMAX )  0.4 MG CAPS capsule Take 1 capsule (0.4 mg total) by mouth at bedtime. 03/12/24   Love, Sharlet RAMAN, PA-C  Tiotropium Bromide-Olodaterol (STIOLTO RESPIMAT ) 2.5-2.5 MCG/ACT AERS Inhale 2 puffs into the lungs daily. 04/02/24    Kassie Acquanetta Bradley, MD    Family History Family History  Problem Relation Age of Onset   Diabetes Mother    Heart disease Father        Died 75, MI   Cancer Father    Hyperlipidemia Father    Stroke Father    Prostate cancer Brother    Colon polyps Brother    Diabetes Brother    Colon polyps Brother    Asthma Brother    COPD Brother    Colon cancer Brother        Dx age 24   Cancer Brother    Colon cancer Other    Esophageal cancer Neg Hx    Rectal cancer Neg Hx    Stomach cancer Neg Hx    Pancreatic cancer Neg Hx    Kidney disease Neg Hx    Liver disease Neg Hx    Neuropathy Neg Hx     Social History Social History[1]   Allergies   Levaquin  [levofloxacin ]   Review of Systems Review of Systems  Respiratory:  Positive for cough and shortness of breath.    Per HPI  Physical Exam Triage Vital Signs ED Triage Vitals  Encounter Vitals Group     BP 06/17/24 0824 134/75     Girls Systolic BP Percentile --      Girls Diastolic BP Percentile --      Boys Systolic BP Percentile --      Boys Diastolic BP Percentile --      Pulse Rate 06/17/24 0824 84     Resp 06/17/24 0824 18     Temp 06/17/24 0824 (!) 97.4 F (36.3 C)     Temp Source 06/17/24 0824 Oral     SpO2 06/17/24 0824 92 %     Weight --      Height --      Head Circumference --      Peak Flow --      Pain Score 06/17/24 0827 6     Pain Loc --      Pain Education --      Exclude from Growth Chart --    No data found.  Updated Vital Signs BP 134/75 (BP Location: Right Arm)   Pulse 84   Temp (!) 97.4 F (36.3 C) (Oral)   Resp 18   SpO2 92%   Visual Acuity Right Eye Distance:   Left Eye Distance:   Bilateral Distance:    Right Eye Near:   Left Eye Near:    Bilateral Near:     Physical Exam Vitals and nursing note reviewed.  Constitutional:      General: He is awake. He is not in acute distress.    Appearance: Normal appearance. He is well-developed and well-groomed. He is not  ill-appearing.  HENT:     Nose: Congestion present.     Mouth/Throat:     Mouth: Mucous membranes are moist.     Pharynx: Posterior oropharyngeal erythema and postnasal drip present. No oropharyngeal exudate.  Cardiovascular:     Rate and Rhythm: Normal rate and regular rhythm.  Pulmonary:     Effort: Pulmonary effort is normal.     Breath sounds: Normal breath sounds.  Skin:    General: Skin  is warm and dry.  Neurological:     General: No focal deficit present.     Mental Status: He is alert and oriented to person, place, and time. Mental status is at baseline.  Psychiatric:        Behavior: Behavior is cooperative.      UC Treatments / Results  Labs (all labs ordered are listed, but only abnormal results are displayed) Labs Reviewed  POC COVID19/FLU A&B COMBO    EKG   Radiology DG Chest 1 View Result Date: 06/17/2024 EXAM: 1 VIEW(S) XRAY OF THE CHEST 06/17/2024 09:18:44 AM COMPARISON: 02/21/2024 cxr CLINICAL HISTORY: Cough, shortness of breath, left sided rib pain. FINDINGS: LUNGS AND PLEURA: Low lung volume. Interval development of right lower lung zone and perihilar airspace opacities. Linear scarring/atelectasis in right lower lung zone. Persistent left retrocardiac airspace opacity obscuring left medial hemidiaphragm and descending thoracic aorta, likely representing combination of left lung atelectasis and/or consolidation. Blunting of bilateral lateral CP angles. Trace bilateral pleural effusions. No pneumothorax. HEART AND MEDIASTINUM: No acute abnormality of the cardiac and mediastinal silhouettes. BONES AND SOFT TISSUES: Cervical spinal fixation hardware in place. Left shoulder arthroplasty noted. Lumbar spinal fixation hardware partially visualized. IMPRESSION: 1. Interval development of right lower lung zone and perihilar airspace opacities.Follow-up PA and lateral chest X-ray is recommended in 3-4 weeks following therapy to ensure resolution and exclude underlying  malignancy. 2. Persistent left retrocardiac airspace opacity, likely representing atelectasis and/or consolidation. 3. Trace bilateral pleural effusions. Electronically signed by: Morgane Naveau MD 06/17/2024 09:24 AM EST RP Workstation: [id number]    Procedures Procedures (including critical care time)  Medications Ordered in UC Medications - No data to display  Initial Impression / Assessment and Plan / UC Course  I have reviewed the triage vital signs and the nursing notes.  Pertinent labs & imaging results that were available during my care of the patient were reviewed by me and considered in my medical decision making (see chart for details).     Patient is overall well-appearing.  Vitals are overall stable.  COVID and flu testing negative.  Chest x-ray revealed interval development of right lower lung zone and perihilar airspace opacities and recommends follow-up chest x-rays in 3 to 4 weeks to ensure resolution.  Also reveals persistent left retrocardiac airspace opacity likely representing atelectasis or consolidation as well as trace bilateral pleural effusions.  I independently interpreted these images and agree with these findings.  Started patient on doxycycline  to cover for possible pneumonia related to these findings.  Recommended patient pause Bactrim  while taking this.  Prescribed Tessalon  as needed for cough.  Discussed continuing use of previously prescribed inhalers and continue to take daily prednisone .  Discussed follow-up, return, and strict ER precautions. Final Clinical Impressions(s) / UC Diagnoses   Final diagnoses:  Acute cough  Community acquired pneumonia of right lower lobe of lung     Discharge Instructions      Your x-ray did reveal some evidence and concern for pneumonia. Start taking doxycycline  twice daily for 10 days. While you are taking this please pause your Bactrim  that you are taking daily.  Restart this after you are finished with  doxycycline . I have also prescribed Tessalon  that he can take every 8 hours as needed for cough. Continue using your daily inhaler as well as using albuterol  every 4-6 hours as needed for shortness of breath and wheezing. If he were to develop worsening shortness of breath, severe chest pain, persistent fevers, severe weakness, or confusion  please seek immediate medical treatment in the emergency department. It is recommended that you have a repeat chest x-ray in 3 to 4 weeks to determine if there is resolution of this.  You can have this done by your primary care provider. Return here as needed.     ED Prescriptions     Medication Sig Dispense Auth. Provider   azithromycin  (ZITHROMAX  Z-PAK) 250 MG tablet  (Status: Discontinued) Take 2 pills (500mg ) first day and one pill (250mg ) the remaining 4 days. 6 tablet Johnie Flaming A, NP   benzonatate  (TESSALON ) 100 MG capsule Take 1 capsule (100 mg total) by mouth every 8 (eight) hours. 21 capsule Johnie Flaming A, NP   amoxicillin -clavulanate (AUGMENTIN ) 875-125 MG tablet  (Status: Discontinued) Take 1 tablet by mouth every 12 (twelve) hours. 14 tablet Johnie Flaming A, NP   doxycycline  (VIBRAMYCIN ) 100 MG capsule Take 1 capsule (100 mg total) by mouth 2 (two) times daily. 20 capsule Johnie Flaming A, NP      PDMP not reviewed this encounter.     [1]  Social History Tobacco Use   Smoking status: Never    Passive exposure: Past   Smokeless tobacco: Never  Vaping Use   Vaping status: Never Used  Substance Use Topics   Alcohol use: No   Drug use: No     Johnie Flaming LABOR, NP 06/17/24 1035  "

## 2024-06-17 NOTE — Discharge Instructions (Addendum)
 Your x-ray did reveal some evidence and concern for pneumonia. Start taking doxycycline  twice daily for 10 days. While you are taking this please pause your Bactrim  that you are taking daily.  Restart this after you are finished with doxycycline . I have also prescribed Tessalon  that he can take every 8 hours as needed for cough. Continue using your daily inhaler as well as using albuterol  every 4-6 hours as needed for shortness of breath and wheezing. If he were to develop worsening shortness of breath, severe chest pain, persistent fevers, severe weakness, or confusion please seek immediate medical treatment in the emergency department. It is recommended that you have a repeat chest x-ray in 3 to 4 weeks to determine if there is resolution of this.  You can have this done by your primary care provider. Return here as needed.

## 2024-06-19 ENCOUNTER — Inpatient Hospital Stay (HOSPITAL_COMMUNITY)
Admission: EM | Admit: 2024-06-19 | Discharge: 2024-06-23 | DRG: 291 | Disposition: A | Discharge status: Rehabilitation Facility | Attending: Internal Medicine | Admitting: Internal Medicine

## 2024-06-19 ENCOUNTER — Encounter (HOSPITAL_COMMUNITY): Payer: Self-pay | Admitting: *Deleted

## 2024-06-19 ENCOUNTER — Emergency Department (HOSPITAL_COMMUNITY)

## 2024-06-19 ENCOUNTER — Other Ambulatory Visit: Payer: Self-pay

## 2024-06-19 DIAGNOSIS — R0902 Hypoxemia: Principal | ICD-10-CM

## 2024-06-19 DIAGNOSIS — K219 Gastro-esophageal reflux disease without esophagitis: Secondary | ICD-10-CM | POA: Diagnosis present

## 2024-06-19 DIAGNOSIS — E876 Hypokalemia: Secondary | ICD-10-CM | POA: Diagnosis present

## 2024-06-19 DIAGNOSIS — M439 Deforming dorsopathy, unspecified: Secondary | ICD-10-CM | POA: Diagnosis present

## 2024-06-19 DIAGNOSIS — Z89511 Acquired absence of right leg below knee: Secondary | ICD-10-CM

## 2024-06-19 DIAGNOSIS — J189 Pneumonia, unspecified organism: Secondary | ICD-10-CM | POA: Diagnosis present

## 2024-06-19 DIAGNOSIS — E785 Hyperlipidemia, unspecified: Secondary | ICD-10-CM | POA: Diagnosis present

## 2024-06-19 DIAGNOSIS — N400 Enlarged prostate without lower urinary tract symptoms: Secondary | ICD-10-CM | POA: Diagnosis present

## 2024-06-19 DIAGNOSIS — I5033 Acute on chronic diastolic (congestive) heart failure: Secondary | ICD-10-CM | POA: Diagnosis present

## 2024-06-19 DIAGNOSIS — J4489 Other specified chronic obstructive pulmonary disease: Secondary | ICD-10-CM | POA: Diagnosis present

## 2024-06-19 DIAGNOSIS — I480 Paroxysmal atrial fibrillation: Secondary | ICD-10-CM | POA: Diagnosis present

## 2024-06-19 DIAGNOSIS — J9612 Chronic respiratory failure with hypercapnia: Secondary | ICD-10-CM | POA: Insufficient documentation

## 2024-06-19 DIAGNOSIS — J9601 Acute respiratory failure with hypoxia: Secondary | ICD-10-CM | POA: Diagnosis present

## 2024-06-19 DIAGNOSIS — R569 Unspecified convulsions: Secondary | ICD-10-CM

## 2024-06-19 DIAGNOSIS — I5022 Chronic systolic (congestive) heart failure: Secondary | ICD-10-CM | POA: Diagnosis present

## 2024-06-19 DIAGNOSIS — R5381 Other malaise: Secondary | ICD-10-CM | POA: Diagnosis present

## 2024-06-19 LAB — COMPREHENSIVE METABOLIC PANEL WITH GFR
ALT: 33 U/L (ref 0–44)
AST: 32 U/L (ref 15–41)
Albumin: 3.1 g/dL — ABNORMAL LOW (ref 3.5–5.0)
Alkaline Phosphatase: 111 U/L (ref 38–126)
Anion gap: 10 (ref 5–15)
BUN: 25 mg/dL — ABNORMAL HIGH (ref 8–23)
CO2: 25 mmol/L (ref 22–32)
Calcium: 8.5 mg/dL — ABNORMAL LOW (ref 8.9–10.3)
Chloride: 105 mmol/L (ref 98–111)
Creatinine, Ser: 0.67 mg/dL (ref 0.61–1.24)
GFR, Estimated: >60 mL/min
Glucose, Bld: 79 mg/dL (ref 70–99)
Potassium: 3 mmol/L — ABNORMAL LOW (ref 3.5–5.1)
Sodium: 140 mmol/L (ref 135–145)
Total Bilirubin: 0.4 mg/dL (ref 0.0–1.2)
Total Protein: 5 g/dL — ABNORMAL LOW (ref 6.5–8.1)

## 2024-06-19 LAB — CBC WITH DIFFERENTIAL/PLATELET
Abs Immature Granulocytes: 0.55 10*3/uL — ABNORMAL HIGH (ref 0.00–0.07)
Basophils Absolute: 0.1 10*3/uL (ref 0.0–0.1)
Basophils Relative: 1 %
Eosinophils Absolute: 0 10*3/uL (ref 0.0–0.5)
Eosinophils Relative: 0 %
HCT: 32.3 % — ABNORMAL LOW (ref 39.0–52.0)
Hemoglobin: 9.9 g/dL — ABNORMAL LOW (ref 13.0–17.0)
Immature Granulocytes: 8 %
Lymphocytes Relative: 16 %
Lymphs Abs: 1.2 10*3/uL (ref 0.7–4.0)
MCH: 28.9 pg (ref 26.0–34.0)
MCHC: 30.7 g/dL (ref 30.0–36.0)
MCV: 94.2 fL (ref 80.0–100.0)
Monocytes Absolute: 0.7 10*3/uL (ref 0.1–1.0)
Monocytes Relative: 10 %
Neutro Abs: 4.6 10*3/uL (ref 1.7–7.7)
Neutrophils Relative %: 65 %
Platelets: 173 10*3/uL (ref 150–400)
RBC: 3.43 MIL/uL — ABNORMAL LOW (ref 4.22–5.81)
RDW: 17.8 % — ABNORMAL HIGH (ref 11.5–15.5)
Smear Review: NORMAL
WBC: 7.1 10*3/uL (ref 4.0–10.5)
nRBC: 0.4 % — ABNORMAL HIGH (ref 0.0–0.2)

## 2024-06-19 LAB — PROTIME-INR
INR: 1.2 (ref 0.8–1.2)
Prothrombin Time: 16.2 s — ABNORMAL HIGH (ref 11.4–15.2)

## 2024-06-19 LAB — I-STAT VENOUS BLOOD GAS, ED
Acid-Base Excess: 7 mmol/L — ABNORMAL HIGH (ref 0.0–2.0)
Bicarbonate: 30.7 mmol/L — ABNORMAL HIGH (ref 20.0–28.0)
Calcium, Ion: 1.09 mmol/L — ABNORMAL LOW (ref 1.15–1.40)
HCT: 31 % — ABNORMAL LOW (ref 39.0–52.0)
Hemoglobin: 10.5 g/dL — ABNORMAL LOW (ref 13.0–17.0)
O2 Saturation: 96 %
Potassium: 3.3 mmol/L — ABNORMAL LOW (ref 3.5–5.1)
Sodium: 141 mmol/L (ref 135–145)
TCO2: 32 mmol/L (ref 22–32)
pCO2, Ven: 41.4 mmHg — ABNORMAL LOW (ref 44–60)
pH, Ven: 7.479 — ABNORMAL HIGH (ref 7.25–7.43)
pO2, Ven: 75 mmHg — ABNORMAL HIGH (ref 32–45)

## 2024-06-19 LAB — I-STAT CG4 LACTIC ACID, ED
Lactic Acid, Venous: 1.4 mmol/L (ref 0.5–1.9)
Lactic Acid, Venous: 0.5 mmol/L (ref 0.5–1.9)

## 2024-06-19 LAB — PROCALCITONIN: Procalcitonin: 0.24 ng/mL

## 2024-06-19 LAB — URINALYSIS, W/ REFLEX TO CULTURE (INFECTION SUSPECTED)
Bacteria, UA: NONE SEEN
Bilirubin Urine: NEGATIVE
Glucose, UA: NEGATIVE mg/dL
Ketones, ur: NEGATIVE mg/dL
Leukocytes,Ua: NEGATIVE
Nitrite: NEGATIVE
Protein, ur: 30 mg/dL — AB
Specific Gravity, Urine: 1.021 (ref 1.005–1.030)
pH: 5 (ref 5.0–8.0)

## 2024-06-19 LAB — TROPONIN T, HIGH SENSITIVITY
Troponin T High Sensitivity: 54 ng/L — ABNORMAL HIGH (ref 0–19)
Troponin T High Sensitivity: 52 ng/L — ABNORMAL HIGH (ref 0–19)

## 2024-06-19 LAB — PHOSPHORUS: Phosphorus: 2.8 mg/dL (ref 2.5–4.6)

## 2024-06-19 LAB — PRO BRAIN NATRIURETIC PEPTIDE: Pro Brain Natriuretic Peptide: 341 pg/mL — ABNORMAL HIGH

## 2024-06-19 LAB — MAGNESIUM: Magnesium: 1.8 mg/dL (ref 1.7–2.4)

## 2024-06-19 MED ORDER — SODIUM CHLORIDE 0.9 % IV SOLN: INTRAVENOUS | Status: DC

## 2024-06-19 MED ORDER — AZITHROMYCIN 250 MG PO TABS: 500.0000 mg | ORAL_TABLET | Freq: Every day | ORAL | Status: DC

## 2024-06-19 MED ORDER — CYCLOBENZAPRINE HCL 5 MG PO TABS: 5.0000 mg | ORAL_TABLET | Freq: Three times a day (TID) | ORAL | Status: DC | PRN

## 2024-06-19 MED ORDER — ONDANSETRON HCL 4 MG/2ML IJ SOLN: 4.0000 mg | Freq: Four times a day (QID) | INTRAMUSCULAR | Status: DC | PRN

## 2024-06-19 MED ORDER — HEPARIN SODIUM (PORCINE) 5000 UNIT/ML IJ SOLN: 5000.0000 [IU] | Freq: Three times a day (TID) | INTRAMUSCULAR | Status: DC

## 2024-06-19 MED ORDER — HYDRALAZINE HCL 20 MG/ML IJ SOLN: 10.0000 mg | INTRAMUSCULAR | Status: DC | PRN

## 2024-06-19 MED ORDER — APIXABAN 5 MG PO TABS
5.0000 mg | ORAL_TABLET | Freq: Two times a day (BID) | ORAL | Status: DC
  Administered 2024-06-19 – 2024-06-23 (×9): 5 mg via ORAL
  Filled 2024-06-19 (×9): qty 1

## 2024-06-19 MED ORDER — BISACODYL 5 MG PO TBEC: 5.0000 mg | DELAYED_RELEASE_TABLET | Freq: Every day | ORAL | Status: DC | PRN

## 2024-06-19 MED ORDER — PANTOPRAZOLE SODIUM 40 MG PO TBEC
40.0000 mg | DELAYED_RELEASE_TABLET | Freq: Two times a day (BID) | ORAL | Status: DC
  Administered 2024-06-20 – 2024-06-23 (×8): 40 mg via ORAL
  Filled 2024-06-19 (×8): qty 1

## 2024-06-19 MED ORDER — BENZONATATE 100 MG PO CAPS
100.0000 mg | ORAL_CAPSULE | Freq: Three times a day (TID) | ORAL | Status: DC
  Administered 2024-06-19 – 2024-06-23 (×13): 100 mg via ORAL
  Filled 2024-06-19 (×13): qty 1

## 2024-06-19 MED ORDER — ACETAMINOPHEN 650 MG RE SUPP: 650.0000 mg | Freq: Four times a day (QID) | RECTAL | Status: DC | PRN

## 2024-06-19 MED ORDER — DULOXETINE HCL 30 MG PO CPEP
30.0000 mg | ORAL_CAPSULE | Freq: Two times a day (BID) | ORAL | Status: DC
  Administered 2024-06-19 – 2024-06-21 (×4): 30 mg via ORAL
  Filled 2024-06-19 (×4): qty 1

## 2024-06-19 MED ORDER — GUAIFENESIN 100 MG/5ML PO LIQD
10.0000 mL | Freq: Three times a day (TID) | ORAL | Status: DC
  Administered 2024-06-19 – 2024-06-23 (×12): 10 mL via ORAL
  Filled 2024-06-19 (×12): qty 10

## 2024-06-19 MED ORDER — DILTIAZEM HCL ER COATED BEADS 180 MG PO CP24
180.0000 mg | ORAL_CAPSULE | Freq: Every day | ORAL | Status: DC
  Administered 2024-06-20 – 2024-06-23 (×4): 180 mg via ORAL
  Filled 2024-06-19 (×4): qty 1

## 2024-06-19 MED ORDER — SODIUM CHLORIDE 0.9% FLUSH
3.0000 mL | Freq: Two times a day (BID) | INTRAVENOUS | Status: DC
  Administered 2024-06-19 – 2024-06-23 (×8): 3 mL via INTRAVENOUS

## 2024-06-19 MED ORDER — AZITHROMYCIN 250 MG PO TABS
500.0000 mg | ORAL_TABLET | Freq: Once | ORAL | Status: AC
  Administered 2024-06-19: 500 mg via ORAL
  Filled 2024-06-19: qty 2

## 2024-06-19 MED ORDER — SODIUM CHLORIDE 0.9% FLUSH
3.0000 mL | Freq: Two times a day (BID) | INTRAVENOUS | Status: DC
  Administered 2024-06-19 – 2024-06-23 (×9): 3 mL via INTRAVENOUS

## 2024-06-19 MED ORDER — IPRATROPIUM BROMIDE 0.02 % IN SOLN
0.5000 mg | Freq: Four times a day (QID) | RESPIRATORY_TRACT | Status: DC
  Administered 2024-06-19 – 2024-06-21 (×6): 0.5 mg via RESPIRATORY_TRACT
  Filled 2024-06-19 (×7): qty 2.5

## 2024-06-19 MED ORDER — IOHEXOL 350 MG/ML SOLN
75.0000 mL | Freq: Once | INTRAVENOUS | Status: AC | PRN
  Administered 2024-06-19: 75 mL via INTRAVENOUS

## 2024-06-19 MED ORDER — OXYCODONE HCL 5 MG PO TABS: 5.0000 mg | ORAL_TABLET | ORAL | Status: DC | PRN

## 2024-06-19 MED ORDER — TRAZODONE HCL 50 MG PO TABS: 25.0000 mg | ORAL_TABLET | Freq: Every evening | ORAL | Status: DC | PRN

## 2024-06-19 MED ORDER — SODIUM CHLORIDE 0.9 % IV SOLN
1.0000 g | Freq: Once | INTRAVENOUS | Status: AC
  Administered 2024-06-19: 1 g via INTRAVENOUS
  Filled 2024-06-19: qty 10

## 2024-06-19 MED ORDER — LEVALBUTEROL HCL 1.25 MG/0.5ML IN NEBU
1.2500 mg | INHALATION_SOLUTION | Freq: Four times a day (QID) | RESPIRATORY_TRACT | Status: DC
  Administered 2024-06-19 – 2024-06-21 (×6): 1.25 mg via RESPIRATORY_TRACT
  Filled 2024-06-19 (×12): qty 0.5

## 2024-06-19 MED ORDER — SENNOSIDES-DOCUSATE SODIUM 8.6-50 MG PO TABS: 1.0000 | ORAL_TABLET | Freq: Every evening | ORAL | Status: DC | PRN

## 2024-06-19 MED ORDER — FERROUS GLUCONATE 324 (38 FE) MG PO TABS
324.0000 mg | ORAL_TABLET | Freq: Every day | ORAL | Status: DC
  Administered 2024-06-20 – 2024-06-23 (×4): 324 mg via ORAL
  Filled 2024-06-19 (×4): qty 1

## 2024-06-19 MED ORDER — ACETAMINOPHEN 325 MG PO TABS: 650.0000 mg | ORAL_TABLET | Freq: Four times a day (QID) | ORAL | Status: DC | PRN

## 2024-06-19 MED ORDER — ESCITALOPRAM OXALATE 10 MG PO TABS: 10.0000 mg | ORAL_TABLET | Freq: Every day | ORAL | Status: DC

## 2024-06-19 MED ORDER — ISOSORBIDE MONONITRATE ER 30 MG PO TB24
30.0000 mg | ORAL_TABLET | Freq: Every day | ORAL | Status: DC
  Administered 2024-06-20: 30 mg via ORAL
  Filled 2024-06-19: qty 1

## 2024-06-19 MED ORDER — TAMSULOSIN HCL 0.4 MG PO CAPS
0.4000 mg | ORAL_CAPSULE | Freq: Every day | ORAL | Status: DC
  Administered 2024-06-19 – 2024-06-22 (×4): 0.4 mg via ORAL
  Filled 2024-06-19 (×4): qty 1

## 2024-06-19 MED ORDER — FLORANEX PO PACK
1.0000 g | PACK | Freq: Three times a day (TID) | ORAL | Status: DC
  Administered 2024-06-19 – 2024-06-20 (×3): 1 g via ORAL
  Filled 2024-06-19 (×6): qty 1

## 2024-06-19 MED ORDER — HYDROMORPHONE HCL 1 MG/ML IJ SOLN: 0.5000 mg | INTRAMUSCULAR | Status: DC | PRN

## 2024-06-19 MED ORDER — PREGABALIN 75 MG PO CAPS
75.0000 mg | ORAL_CAPSULE | Freq: Every day | ORAL | Status: DC
  Administered 2024-06-19 – 2024-06-23 (×5): 75 mg via ORAL
  Filled 2024-06-19 (×5): qty 1

## 2024-06-19 MED ORDER — METHYLPREDNISOLONE SODIUM SUCC 40 MG IJ SOLR
40.0000 mg | Freq: Two times a day (BID) | INTRAMUSCULAR | Status: DC
  Administered 2024-06-19 – 2024-06-20 (×2): 40 mg via INTRAVENOUS
  Filled 2024-06-19 (×2): qty 1

## 2024-06-19 MED ORDER — POTASSIUM CHLORIDE CRYS ER 20 MEQ PO TBCR
40.0000 meq | EXTENDED_RELEASE_TABLET | Freq: Once | ORAL | Status: AC
  Administered 2024-06-19: 40 meq via ORAL
  Filled 2024-06-19: qty 2

## 2024-06-19 MED ORDER — IPRATROPIUM BROMIDE 0.02 % IN SOLN: 0.5000 mg | Freq: Four times a day (QID) | RESPIRATORY_TRACT | Status: DC | PRN

## 2024-06-19 MED ORDER — SODIUM CHLORIDE 0.9 % IV SOLN: 1.0000 g | INTRAVENOUS | Status: DC

## 2024-06-19 MED ORDER — ATORVASTATIN CALCIUM 10 MG PO TABS
20.0000 mg | ORAL_TABLET | Freq: Every day | ORAL | Status: DC
  Administered 2024-06-19 – 2024-06-23 (×5): 20 mg via ORAL
  Filled 2024-06-19 (×5): qty 2

## 2024-06-19 MED ORDER — ONDANSETRON HCL 4 MG PO TABS: 4.0000 mg | ORAL_TABLET | Freq: Four times a day (QID) | ORAL | Status: DC | PRN

## 2024-06-19 MED ORDER — FLEET ENEMA RE ENEM: 1.0000 | ENEMA | Freq: Once | RECTAL | Status: DC | PRN

## 2024-06-19 NOTE — ED Notes (Signed)
PT provided a urine sample.

## 2024-06-19 NOTE — Assessment & Plan Note (Signed)
 Continue PPI

## 2024-06-19 NOTE — Assessment & Plan Note (Signed)
 Presumed commune acquired pneumonia-based on imaging Following up with CT angiogram of the chest for further evaluation -In ED started on IV antibiotics of Rocephin /azithromycin  -blood cultures been obtained, will try to obtain sputum cultures - Lactic acid 1.4 >.  0.5 -Obtaining procalcitonin level -Anticipating short course of antibiotics with quick de-escalation

## 2024-06-19 NOTE — Assessment & Plan Note (Signed)
 Continue Flomax 

## 2024-06-19 NOTE — ED Notes (Signed)
 PT has the urge to void but is unable to do so at this time. Depends were removed and found to be full with urine. Wife is at the bed side

## 2024-06-19 NOTE — Assessment & Plan Note (Signed)
 Continue atorvastatin  Continue atorvastatin .

## 2024-06-19 NOTE — ED Provider Notes (Signed)
 " [place] EMERGENCY DEPARTMENT AT [facility] Provider Note   CSN: [id number] Arrival date & time: 06/19/24  9195     Patient presents with: Shortness of Breath   Levi [name] is a 79 y.o. male.  {Add pertinent medical, surgical, social history, OB history to YEP:67052}    Patient has a history of acid reflux diverticulitis, bronchitis, COPD, CHF, atrial fibrillation.  Patient states he has been having trouble with cough shortness of breath over the last week or so.  He went to an urgent care 2 days ago and was diagnosed with pneumonia.  Patient was started on antibiotics.  Patient came to the ED today because because he has been feeling more short of breath.  His wife called EMS.  She also reports him being more listless and appeared confused earlier.  No known fevers.  No headache.  No focal weakness or numbness  Prior to Admission medications  Medication Sig Start Date End Date Taking? Authorizing Provider  acetaminophen  (TYLENOL ) 325 MG tablet Take 1-2 tablets (325-650 mg total) by mouth every 4 (four) hours as needed for mild pain (pain score 1-3). Patient not taking: Reported on 05/10/2024 03/12/24   Love, Sharlet RAMAN, PA-C  albuterol  (VENTOLIN  HFA) 108 (90 Base) MCG/ACT inhaler INHALE 2 PUFFS BY MOUTH EVERY 4 HOURS AS NEEDED FOR WHEEZE OR FOR SHORTNESS OF BREATH 01/19/24   Neysa Rama D, MD  allopurinol  (ZYLOPRIM ) 100 MG tablet Take 100 mg by mouth daily. Patient not taking: Reported on 05/10/2024    [provider]  apixaban  (ELIQUIS ) 5 MG TABS tablet Take 1 tablet (5 mg total) by mouth 2 (two) times daily. 02/02/24   Jeffrie Oneil BROCKS, MD  atorvastatin  (LIPITOR) 20 MG tablet Take 1 tablet (20 mg total) by mouth daily. 12/03/23   Jeffrie Oneil BROCKS, MD  benzonatate  (TESSALON ) 100 MG capsule Take 1 capsule (100 mg total) by mouth every 8 (eight) hours. 06/17/24   Johnie Flaming A, NP  cyanocobalamin  (VITAMIN B12) 1000 MCG/ML injection  04/08/24   [provider]   cyclobenzaprine  (FLEXERIL ) 10 MG tablet TAKE 1 TABLET BY MOUTH THREE TIMES A DAY AS NEEDED FOR MUSCLE SPASMS 05/10/24   Crain, Whitney L, PA  diltiazem  (CARDIZEM  CD) 180 MG 24 hr capsule Take 1 capsule (180 mg total) by mouth daily. 08/25/23   Jeffrie Oneil BROCKS, MD  doxycycline  (VIBRAMYCIN ) 100 MG capsule Take 1 capsule (100 mg total) by mouth 2 (two) times daily. 06/17/24   Johnie Flaming A, NP  DULoxetine  (CYMBALTA ) 30 MG capsule Take 30 mg by mouth 2 (two) times daily.    [provider]  ergocalciferol (VITAMIN D2) 1.25 MG (50000 UT) capsule Take 50,000 Units by mouth once a week.    [provider]  escitalopram  (LEXAPRO ) 10 MG tablet Take 1 tablet (10 mg total) by mouth daily. 03/29/24   Crain, Whitney L, PA  ferrous gluconate  (FERGON) 324 MG tablet Take 1 tablet (324 mg total) by mouth daily. 03/12/24   Love, Sharlet RAMAN, PA-C  ipratropium-albuterol  (DUONEB) 0.5-2.5 (3) MG/3ML SOLN 1 NEBULE EVERY 6 HOURS AS NEEDED. **J45.3** 03/22/20   Young, Rama D, MD  isosorbide  mononitrate (IMDUR ) 30 MG 24 hr tablet Take 30 mg by mouth daily.    [provider]  lamoTRIgine  (LAMICTAL ) 25 MG tablet Take by mouth. 04/08/24   [provider]  nutrition supplement, JUVEN, (JUVEN) PACK Take 1 packet by mouth 2 (two) times daily between meals. 03/12/24   Love,  Pamela S, PA-C  oxyCODONE  (OXY IR/ROXICODONE ) 5 MG immediate release tablet Take 1 tablet (5 mg total) by mouth every 4 (four) hours as needed for severe pain (pain score 7-10) or moderate pain (pain score 4-6). 03/12/24   Love, Sharlet RAMAN, PA-C  pantoprazole  (PROTONIX ) 40 MG tablet Take 1 tablet (40 mg total) by mouth 2 (two) times daily before a meal. 03/12/24   Love, Sharlet RAMAN, PA-C  predniSONE  (DELTASONE ) 10 MG tablet Take 1 tablet (10 mg total) by mouth daily with breakfast. 04/02/24   Kassie Acquanetta Bradley, MD  predniSONE  (DELTASONE ) 5 MG tablet Take 4 tablets (20 mg total) by mouth daily with breakfast. 3 tabs daily 03/12/24    Love, Pamela S, PA-C  pregabalin  (LYRICA ) 150 MG capsule Take 1 capsule (150 mg total) by mouth 2 (two) times daily. Patient not taking: Reported on 05/10/2024 01/15/24   Urbano Albright, MD  pregabalin  (LYRICA ) 75 MG capsule Take 1 capsule (75 mg total) by mouth daily. 03/29/24   Crain, Whitney L, PA  sulfamethoxazole -trimethoprim  (BACTRIM ) 400-80 MG tablet Take 1 tablet by mouth 2 (two) times daily. 05/13/24 11/09/24  Vu, Constance T, MD  tamsulosin  (FLOMAX ) 0.4 MG CAPS capsule Take 1 capsule (0.4 mg total) by mouth at bedtime. 03/12/24   Love, Sharlet RAMAN, PA-C  Tiotropium Bromide-Olodaterol (STIOLTO RESPIMAT ) 2.5-2.5 MCG/ACT AERS Inhale 2 puffs into the lungs daily. 04/02/24   Kassie Acquanetta Bradley, MD    Allergies: Levaquin  [levofloxacin ]    Review of Systems  Updated Vital Signs BP 129/76   Pulse 70   Resp 15   Ht 1.854 m (6' 1)   Wt 88.5 kg   SpO2 100%   BMI 25.74 kg/m   Physical Exam Vitals and nursing note reviewed.  Constitutional:      Appearance: He is ill-appearing.  HENT:     Head: Normocephalic and atraumatic.     Right Ear: External ear normal.     Left Ear: External ear normal.  Eyes:     General: No scleral icterus.       Right eye: No discharge.        Left eye: No discharge.     Conjunctiva/sclera: Conjunctivae normal.  Neck:     Trachea: No tracheal deviation.  Cardiovascular:     Rate and Rhythm: Normal rate and regular rhythm.  Pulmonary:     Effort: Pulmonary effort is normal. No respiratory distress.     Breath sounds: No stridor. Rales present. No wheezing.  Abdominal:     General: Bowel sounds are normal. There is no distension.     Palpations: Abdomen is soft.     Tenderness: There is no abdominal tenderness. There is no guarding or rebound.  Musculoskeletal:        General: No tenderness or deformity.     Cervical back: Neck supple.     Comments: Mild edema noted in the extremities  Skin:    General: Skin is warm and dry.     Findings: No rash.   Neurological:     General: No focal deficit present.     Mental Status: He is alert.     Cranial Nerves: No cranial nerve deficit, dysarthria or facial asymmetry.     Sensory: No sensory deficit.     Motor: No abnormal muscle tone or seizure activity.     Coordination: Coordination normal.  Psychiatric:        Mood and Affect: Mood normal.     (all labs  ordered are listed, but only abnormal results are displayed) Labs Reviewed  CBC WITH DIFFERENTIAL/PLATELET - Abnormal; Notable for the following components:      Result Value   RBC 3.43 (*)    Hemoglobin 9.9 (*)    HCT 32.3 (*)    RDW 17.8 (*)    nRBC 0.4 (*)    Abs Immature Granulocytes 0.55 (*)    All other components within normal limits  PROTIME-INR - Abnormal; Notable for the following components:   Prothrombin Time 16.2 (*)    All other components within normal limits  COMPREHENSIVE METABOLIC PANEL WITH GFR - Abnormal; Notable for the following components:   Potassium 3.0 (*)    BUN 25 (*)    Calcium  8.5 (*)    Total Protein 5.0 (*)    Albumin 3.1 (*)    All other components within normal limits  I-STAT VENOUS BLOOD GAS, ED - Abnormal; Notable for the following components:   pH, Ven 7.479 (*)    pCO2, Ven 41.4 (*)    pO2, Ven 75 (*)    Bicarbonate 30.7 (*)    Acid-Base Excess 7.0 (*)    Potassium 3.3 (*)    Calcium , Ion 1.09 (*)    HCT 31.0 (*)    Hemoglobin 10.5 (*)    All other components within normal limits  TROPONIN T, HIGH SENSITIVITY - Abnormal; Notable for the following components:   Troponin T High Sensitivity 54 (*)    All other components within normal limits  TROPONIN T, HIGH SENSITIVITY - Abnormal; Notable for the following components:   Troponin T High Sensitivity 52 (*)    All other components within normal limits  CULTURE, BLOOD (ROUTINE X 2)  CULTURE, BLOOD (ROUTINE X 2)  PRO BRAIN NATRIURETIC PEPTIDE  URINALYSIS, W/ REFLEX TO CULTURE (INFECTION SUSPECTED)  I-STAT CG4 LACTIC ACID, ED   I-STAT CG4 LACTIC ACID, ED    EKG: EKG Interpretation Date/Time:  Saturday June 19 2024 08:09:32 EST Ventricular Rate:  87 PR Interval:  142 QRS Duration:  99 QT Interval:  397 QTC Calculation: 478 R Axis:   -11  Text Interpretation: Sinus rhythm Atrial premature complex Abnormal R-wave progression, early transition Nonspecific T abnormalities, lateral leads Borderline prolonged QT interval No significant change since last tracing Confirmed by Randol Simmonds [phone]) on 06/19/2024 8:18:17 AM  Radiology: [facility] Chest Port 1 View Result Date: 06/19/2024 EXAM: 1 VIEW XRAY OF THE CHEST 06/19/2024 08:43:00 AM COMPARISON: 06/17/2024 CLINICAL HISTORY: 79 year old male with questionable sepsis. recently treated for pneumonia, increased confusion FINDINGS: LUNGS AND PLEURA: Low lung volumes. Linear opacities overlying right lower lung zones. Bilateral blunting of costophrenic angles. No focal pulmonary opacity. No pneumothorax. HEART AND MEDIASTINUM: No acute abnormality of the cardiac and mediastinal silhouettes. BONES AND SOFT TISSUES: Cervical spinal fixation hardware noted. Left shoulder arthroplasty noted. Lumbar spinal fixation hardware partially seen. No acute osseous abnormality. IMPRESSION: 1. Low lung volumes with increasing basilar hypoventilation greater on the left. Lung base infection not excluded. Small chronic pleural effusion(s). Electronically signed by: Helayne Hurst MD 06/19/2024 09:01 AM EST RP Workstation: [id number]    {Document cardiac monitor, telemetry assessment procedure when appropriate:32947} Procedures   Medications Ordered in the ED  cefTRIAXone  (ROCEPHIN ) 1 g in sodium chloride  0.9 % 100 mL IVPB (1 g Intravenous New Bag/Given 06/19/24 1207)  azithromycin  (ZITHROMAX ) tablet 500 mg (500 mg Oral Given 06/19/24 1203)    Clinical Course as of 06/19/24 1404  Sat Jun 19, 2024  1131 I-Stat  Lactic Acid, ED Lactic acid level normal. [JK]  1131 CBC with Differential(!) Anemia  stable [JK]  1131 I-Stat venous blood gas, ([facility] ED, [facility], [facility])(!) Decreased PO2 [JK]  1131 DG Chest Port 1 View Chest x-ray does show finding suggestive of pneumonia [JK]  1210 Comprehensive metabolic panel with GFR(!) Hypokalemia noted [JK]  1404 Case discussed with Dr. Willette regarding admission [JK]    Clinical Course User Index [JK] Randol Simmonds, MD   {Click here for ABCD2, HEART and other calculators REFRESH Note before signing:1}                              Medical Decision Making Differential diagnosis includes but not limited to pneumonia, CHF, pulmonary embolism  Amount and/or Complexity of Data Reviewed Labs: ordered. Decision-making details documented in ED Course. Radiology: ordered. Decision-making details documented in ED Course.  Risk Prescription drug management.  Patient noted to have a new oxygen requirement.  His oxygen saturation decreased to the 80s without supplemental oxygen.  I suspect this could be contributing to his symptoms ***  {Document critical care time when appropriate  Document review of labs and clinical decision tools ie CHADS2VASC2, etc  Document your independent review of radiology images and any outside records  Document your discussion with family members, caretakers and with consultants  Document social determinants of health affecting pt's care  Document your decision making why or why not admission, treatments were needed:32947:::1}   Final diagnoses:  None    ED Discharge Orders     None        "

## 2024-06-19 NOTE — Hospital Course (Addendum)
 Mr. Carden was admitted to the hospital with the working diagnosis of heart failure exacerbation.   79 year old male with extensive history of iron  deficiency anemia, arthritis, paroxysmal atrial fibrillation, heart failure, GERD, HLD, Gout, Depression hypothyroidism, BPH, chronic bronchitis, (agent orange exposure) COPD and recent osteomyelitis of lower extremity with BKA, who presented with dyspnea.   Patient wad worsening symptoms for the last 7 days prior to admission. As outpatient 01/22 he was diagnosed with pneumonia and was placed on doxycycline  for antibiotic therapy. Unfortunately his symptoms continue to worsen. On the day of admission he was noted to be confused, EMS was called and he was found with increased work of breathing and 02 saturation of 88% on room air. He was placed on 4 L/min per [place] and was transported to the ED.  On his initial physical examination his blood pressure was 162/72, HR 61, RR 15 02 saturation 100%  Lungs with no wheezing or rhonchi, positive rales, heart with S1 and S2 present and regular with no gallops or rubs, abdomen with no distention, positive lower extremity edema, on the left, noted right BKA    VBG 7.47/ 41.4/ 75/ 30/ 96% Na 140 K 3.0 Cl 105 bicarbonate 25 glucose 79 bun 25 cr 0,67  High sensitive troponin 54 and 52  Lactic acid 1,4 and 0,5  Pro calcitonin 0,24  Wbc 7,1 hgb 9,9 plt 173   Urine analysis SG 1,021, protein 30, hgb small and negative leukocytes   Chest radiograph with hypoinflation, positive cardiomegaly, bilateral hilar vascular congestion.   EKG 87 bpm, left axis deviation, qtc 478, normal intervals, sinus rhythm with PAC in a bigeminy pattern with no significant ST segment or T wave changes   CT chest with bilateral ground glass opacities, bilateral pleural effusions.  No pulmonary embolism, new compression deformities at T6 and T7, possibly acute.   Patient placed on furosemide  for diuresis   01/26 improved volume status,  possible discharge home in the next 24 hrs  01/27 clinically euvolemic state, pending bed available at [facility]. Patient medically stable.

## 2024-06-19 NOTE — H&P (Addendum)
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 understanding and agreement of above plan)  --------------------------------------------------------------------------------------------------------------------------------------------------  Disposition Plan:  Anticipated 1-2 days    Time spent 77 minutes in seeing evaluating patient, examined the patient.   Admitting. reviewing electronic medical records.  Reviewing home medication, drawn plan of care   SIGNED: Adriana DELENA Grams, MD, FHM. FAAFP [facility],  Pager (please use [email or web address] to page/text)  Please use Epic Secure Chat for non-urgent communication (7AM-7PM) If 7PM-7AM, please contact night-coverage [email or web address],  06/19/2024, 4:23 PM     [1]  Allergies Allergen Reactions   Levaquin  [Levofloxacin ] Itching    Tolerated ciprofloxacin  in 2022.   "

## 2024-06-19 NOTE — Assessment & Plan Note (Signed)
 Consulting PT/OT for evaluation recommendation

## 2024-06-19 NOTE — Assessment & Plan Note (Signed)
 Today renal function with serum cr at 0,67 with K at 3,6 and serum bicarbonate at 27  Na 138 and Mg 1.9   Will add 40 meq Kcl and 2 g Mg sulfate to avoid hypokalemia and hypomagnesemia  Transition to oral loop diuretic  Follow up renal function and electrolytes in am.

## 2024-06-19 NOTE — Assessment & Plan Note (Signed)
 No sings of acute exacerbation, will discontinue systemic corticosteroids Continue bronchodilator therapy  Patient has been on prednisone  for a long time

## 2024-06-19 NOTE — ED Notes (Signed)
 Robitussin unavailable in ED pyxis and there wasn't time to give breathing tx before transport. He is not in distress at this time. Feels ok about his breathing.

## 2024-06-19 NOTE — ED Triage Notes (Signed)
 Patient presents to ed via [facility]  from home , patient was seen at [facility] and was treated for pneumonia  and started on doxcyc. Wife seem to think he was more confused this am. And was weaker, upon arrival of ems o2 sats were 88 % on RA  placed on 4 liters [place] sats increased to 93%, Patient is on a thinner and has multiple skin tears strong urine odor

## 2024-06-19 NOTE — Consult Note (Signed)
 WOC Nurse Consult Note: Reason for Consult: Bilateral lower extremity including-acute on chronic right stump wound  Wound type: Pressure Injury POA: Yes/No/NA Measurement: Wound bed: Drainage (amount, consistency, odor)  Periwound: Dressing procedure/placement/frequency: Conservative sharp wound debridement (CSWD performed at the bedside):

## 2024-06-19 NOTE — Assessment & Plan Note (Deleted)
 Acute hypoxic respiratory failure with underlying lung disease Concern with COPD exacerbation, superimposed with possible infection, pneumonia -Was satting 88% on room air on arrival -Currently satting 100% on 2 L of -Continue DuoNeb bronchodilator -Continue IV steroid -Continue pulmonary toiletry -Supportive therapy, including mucolytics -Goal to wean patient off supplemental oxygen   Addendum:  CTA chest-reviewed 1. No pulmonary embolism. 2. Small bilateral pleural effusions with basilar atelectasis or consolidation bilaterally. 3. Mild cardiac enlargement. No pericardial effusion. 4. New compression deformities at T6 and T7, possibly acute.

## 2024-06-19 NOTE — ED Notes (Signed)
 Pt informed he does not eat meat so diet changed to vegetarian.

## 2024-06-19 NOTE — ED Notes (Signed)
Got patient into a gown on the monitor did EKG shown to Dr knapp patient is resting with call bell in reach

## 2024-06-19 NOTE — Consult Note (Signed)
 Please note that the [facility] nursing team is utilizing a standardized work plan to manage patient consults. We are triaging consults and will try to see the patients within 24 hours. Wound photos in the patient's chart allow us  to consult on the patient in the most efficient and timely manner.    Teiara Baria [facility], CNS, CWON-AP [phone]

## 2024-06-19 NOTE — Assessment & Plan Note (Signed)
 Currently stable, - Continue diltiazem , Eliquis 

## 2024-06-19 NOTE — Assessment & Plan Note (Signed)
 No signs of volume overload -Monitoring closely -Last echo 02/2024 EF 55-60%,-no overt structural abnormalities reported -Monitoring daily weight, I's and O's

## 2024-06-20 ENCOUNTER — Observation Stay (HOSPITAL_COMMUNITY)

## 2024-06-20 ENCOUNTER — Encounter (HOSPITAL_COMMUNITY): Payer: Self-pay | Admitting: Family Medicine

## 2024-06-20 DIAGNOSIS — E876 Hypokalemia: Secondary | ICD-10-CM

## 2024-06-20 DIAGNOSIS — M439 Deforming dorsopathy, unspecified: Secondary | ICD-10-CM | POA: Diagnosis present

## 2024-06-20 DIAGNOSIS — I5033 Acute on chronic diastolic (congestive) heart failure: Secondary | ICD-10-CM | POA: Diagnosis not present

## 2024-06-20 DIAGNOSIS — S22050A Wedge compression fracture of T5-T6 vertebra, initial encounter for closed fracture: Secondary | ICD-10-CM | POA: Diagnosis not present

## 2024-06-20 DIAGNOSIS — E7800 Pure hypercholesterolemia, unspecified: Secondary | ICD-10-CM | POA: Diagnosis not present

## 2024-06-20 DIAGNOSIS — J4489 Other specified chronic obstructive pulmonary disease: Secondary | ICD-10-CM | POA: Diagnosis not present

## 2024-06-20 DIAGNOSIS — R569 Unspecified convulsions: Secondary | ICD-10-CM

## 2024-06-20 DIAGNOSIS — N401 Enlarged prostate with lower urinary tract symptoms: Secondary | ICD-10-CM

## 2024-06-20 DIAGNOSIS — K219 Gastro-esophageal reflux disease without esophagitis: Secondary | ICD-10-CM

## 2024-06-20 DIAGNOSIS — R0609 Other forms of dyspnea: Secondary | ICD-10-CM

## 2024-06-20 DIAGNOSIS — I480 Paroxysmal atrial fibrillation: Secondary | ICD-10-CM | POA: Diagnosis not present

## 2024-06-20 LAB — ECHOCARDIOGRAM LIMITED
Calc EF: 45.4 %
Height: 73 in
S' Lateral: 4.2 cm
Single Plane A2C EF: 40.1 %
Single Plane A4C EF: 50.1 %
Weight: 3016 [oz_av]

## 2024-06-20 LAB — BASIC METABOLIC PANEL WITH GFR
Anion gap: 9 (ref 5–15)
BUN: 24 mg/dL — ABNORMAL HIGH (ref 8–23)
CO2: 27 mmol/L (ref 22–32)
Calcium: 8.7 mg/dL — ABNORMAL LOW (ref 8.9–10.3)
Chloride: 104 mmol/L (ref 98–111)
Creatinine, Ser: 0.58 mg/dL — ABNORMAL LOW (ref 0.61–1.24)
GFR, Estimated: >60 mL/min
Glucose, Bld: 79 mg/dL (ref 70–99)
Potassium: 4.1 mmol/L (ref 3.5–5.1)
Sodium: 140 mmol/L (ref 135–145)

## 2024-06-20 LAB — CBC
HCT: 34.1 % — ABNORMAL LOW (ref 39.0–52.0)
Hemoglobin: 10.4 g/dL — ABNORMAL LOW (ref 13.0–17.0)
MCH: 28.2 pg (ref 26.0–34.0)
MCHC: 30.5 g/dL (ref 30.0–36.0)
MCV: 92.4 fL (ref 80.0–100.0)
Platelets: 147 10*3/uL — ABNORMAL LOW (ref 150–400)
RBC: 3.69 MIL/uL — ABNORMAL LOW (ref 4.22–5.81)
RDW: 17.4 % — ABNORMAL HIGH (ref 11.5–15.5)
WBC: 6.3 10*3/uL (ref 4.0–10.5)
nRBC: 0 % (ref 0.0–0.2)

## 2024-06-20 LAB — APTT: aPTT: 34 s (ref 24–36)

## 2024-06-20 MED ORDER — OXYCODONE HCL 5 MG PO TABS: 5.0000 mg | ORAL_TABLET | ORAL | Status: DC | PRN

## 2024-06-20 MED ORDER — LAMOTRIGINE 25 MG PO TABS
25.0000 mg | ORAL_TABLET | Freq: Every day | ORAL | Status: DC
  Administered 2024-06-20 – 2024-06-23 (×4): 25 mg via ORAL
  Filled 2024-06-20 (×4): qty 1

## 2024-06-20 MED ORDER — SULFAMETHOXAZOLE-TRIMETHOPRIM 400-80 MG PO TABS
1.0000 | ORAL_TABLET | Freq: Two times a day (BID) | ORAL | Status: DC
  Administered 2024-06-20 – 2024-06-23 (×7): 1 via ORAL
  Filled 2024-06-20 (×8): qty 1

## 2024-06-20 MED ORDER — SPIRONOLACTONE 12.5 MG HALF TABLET
12.5000 mg | ORAL_TABLET | Freq: Every day | ORAL | Status: DC
  Administered 2024-06-20 – 2024-06-23 (×4): 12.5 mg via ORAL
  Filled 2024-06-20 (×4): qty 1

## 2024-06-20 MED ORDER — LOSARTAN POTASSIUM 25 MG PO TABS
25.0000 mg | ORAL_TABLET | Freq: Every day | ORAL | Status: DC
  Administered 2024-06-21 – 2024-06-22 (×2): 25 mg via ORAL
  Filled 2024-06-20 (×2): qty 1

## 2024-06-20 MED ORDER — LOSARTAN POTASSIUM 25 MG PO TABS: 25.0000 mg | ORAL_TABLET | Freq: Every day | ORAL | Status: DC

## 2024-06-20 MED ORDER — PREDNISONE 20 MG PO TABS
20.0000 mg | ORAL_TABLET | Freq: Every day | ORAL | Status: DC
  Administered 2024-06-21 – 2024-06-23 (×3): 20 mg via ORAL
  Filled 2024-06-20 (×3): qty 1

## 2024-06-20 MED ORDER — FUROSEMIDE 10 MG/ML IJ SOLN
60.0000 mg | Freq: Two times a day (BID) | INTRAMUSCULAR | Status: DC
  Administered 2024-06-20 – 2024-06-21 (×2): 60 mg via INTRAVENOUS
  Filled 2024-06-20 (×3): qty 6

## 2024-06-20 NOTE — Assessment & Plan Note (Addendum)
 01/2024 echocardiogram with mildly reduced LV systolic function 40 to 45%, with grade I diastolic dysfunction  01/26 limited echocardiogram with no significant change.   Urine output is 2,950 ml Systolic blood pressure 100 mmHg   Continue with losartan  and spironolactone  for blood pressure control and RAAS inhibition Patient had one dose of furosemide  60 mg IV this morning Will transition to po furosemide  tomorrow 40 mg daily   Acute hypoxemic and hypercapnic respiratory failure due to acute cardiogenic pulmonary edema.  Pneumonia has been ruled out, (dc antibiotic therapy) Continue diuresis  02 saturation today 93% on room air at rest.

## 2024-06-20 NOTE — Assessment & Plan Note (Signed)
 Continue with lamotrigine 

## 2024-06-20 NOTE — Evaluation (Signed)
 Occupational Therapy Evaluation Patient Details Name: Levi [name] MRN: [id number] DOB: [birth year] Today's Date: 06/20/2024   History of Present Illness   79 y.o. male presents to [facility] 06/20/24 with progressive SOB, cough, and hypoxia. Admitted with acute hypoxic on chronic CHF. CTA chest showed small B plueral effusions w/ basilar atelectasis or consolidation as well as new compression deformities at T6/T7, possibly acute. PMH includes HTN, HLD, GERD, PAF, HFmrEF, depression, COPD, BPH, R BKA, peripheral neuropathy.     Clinical Impressions PTA, pt living at home with inconsistent report of whether he has aide services as well as how he has most recently been transferring to his wheelchair. Pt reports he had been working on walking with prosthesis but not recently. Pt uses AE at baseline for LB ADL. Upon eval, pt with generalized weakness, decreased strength, balance, safety, and cognition. Pt needing min A for bed mobility and max A +2 for squat pivot transfer. Encouraged RN to to turn chair around and scoot back to bed with +2 A. Recommending intensive multidisciplinary rehabilitation >3 hours/day to optimize safety and independence in ADL; pt previously here with good improvements.       If plan is discharge home, recommend the following:   Two people to help with walking and/or transfers;A lot of help with bathing/dressing/bathroom;Assistance with cooking/housework;Assist for transportation;Help with stairs or ramp for entrance     Functional Status Assessment   Patient has had a recent decline in their functional status and demonstrates the ability to make significant improvements in function in a reasonable and predictable amount of time.     Equipment Recommendations   Other (comment) (defer)     Recommendations for Other Services   Rehab consult     Precautions/Restrictions   Precautions Precautions: Fall Recall of Precautions/Restrictions:  Intact Precaution/Restrictions Comments: chronic R BKA Restrictions Weight Bearing Restrictions Per Provider Order: No     Mobility Bed Mobility Overal bed mobility: Needs Assistance Bed Mobility: Supine to Sit     Supine to sit: Min assist, HOB elevated     General bed mobility comments: increased time and effort with MinA to steady    Transfers Overall transfer level: Needs assistance Equipment used: 1 person hand held assist Transfers: Bed to chair/wheelchair/BSC     Squat pivot transfers: Max assist, +2 physical assistance, +2 safety/equipment       General transfer comment: Squat pivot to the left with pt reaching for arm rest      Balance Overall balance assessment: Needs assistance, Mild deficits observed, not formally tested Sitting-balance support: No upper extremity supported, Feet supported Sitting balance-[name] Scale: Fair     Standing balance support: Bilateral upper extremity supported, During functional activity, Reliant on assistive device for balance Standing balance-[name] Scale: Zero                             ADL either performed or assessed with clinical judgement   ADL Overall ADL's : Needs assistance/impaired Eating/Feeding: Set up;Sitting   Grooming: Set up;Sitting   Upper Body Bathing: Set up;Sitting   Lower Body Bathing: Moderate assistance   Upper Body Dressing : Set up;Sitting   Lower Body Dressing: Maximal assistance Lower Body Dressing Details (indicate cue type and reason): 2/2 pt typically uses sock aide and none present in room Toilet Transfer: Maximal assistance;+2 for physical assistance;+2 for safety/equipment;Squat-pivot           Functional mobility during ADLs: Maximal assistance;+2 for  physical assistance;+2 for safety/equipment       Vision   Additional Comments: not formally assessed, however, needed increased time to visually locate call bell and phone     Perception         Praxis          Pertinent Vitals/Pain Pain Assessment Pain Assessment: Faces Faces Pain Scale: Hurts even more Pain Location: R plantar surface of foot Pain Descriptors / Indicators: Discomfort, Aching Pain Intervention(s): Limited activity within patient's tolerance, Monitored during session     Extremity/Trunk Assessment Upper Extremity Assessment Upper Extremity Assessment: Generalized weakness   Lower Extremity Assessment Lower Extremity Assessment: RLE deficits/detail;LLE deficits/detail RLE Deficits / Details: chronic R BKA, WFL ROM RLE Sensation: decreased light touch;history of peripheral neuropathy (reported phantom limb sensation) LLE Deficits / Details: Grossly 4/5, limited ankle DF/PF AROM LLE Sensation: decreased light touch (not alert to sensation in digits)       Communication Communication Communication: No apparent difficulties   Cognition Arousal: Alert Behavior During Therapy: WFL for tasks assessed/performed Cognition: No family/caregiver present to determine baseline             OT - Cognition Comments: questionable historian as pt with inconsistent report of most recent transfer methods and when he could wear prosthesis last                 Following commands: Impaired Following commands impaired: Follows multi-step commands with increased time, Only follows one step commands consistently     Cueing  General Comments   Cueing Techniques: Verbal cues;Tactile cues  VSS on RA   Exercises     Shoulder Instructions      Home Living Family/patient expects to be discharged to:: Private residence Living Arrangements: Spouse/significant other Available Help at Discharge: Family;Available 24 hours/day Type of Home: House Home Access: Ramped entrance     Home Layout: Able to live on main level with bedroom/bathroom     Bathroom Shower/Tub: Producer, Television/film/video: Handicapped height Bathroom Accessibility: Yes How Accessible: Accessible  via walker;Accessible via wheelchair Home Equipment: Shower Scientist, Physiological (2 wheels);Cane - single point;Rollator (4 wheels);Wheelchair - manual;Toilet riser;Tub bench (drop arm BSC, slideboard, sock aid)   Additional Comments: has not been using prosthetic      Prior Functioning/Environment Prior Level of Function : Needs assist             Mobility Comments: most recently using a slide board. Has a R LE prosthetic, but has been unable to use due to wounds/infections ADLs Comments: wife assists PRN for ADLs; has aide 2x per week per report but unsure accuracy    OT Problem List: Decreased strength;Decreased activity tolerance;Impaired balance (sitting and/or standing);Decreased coordination;Decreased cognition;Decreased safety awareness;Decreased knowledge of use of DME or AE;Decreased knowledge of precautions;Impaired UE functional use   OT Treatment/Interventions: Self-care/ADL training;Therapeutic exercise;DME and/or AE instruction;Therapeutic activities;Patient/family education;Balance training      OT Goals(Current goals can be found in the care plan section)   Acute Rehab OT Goals Patient Stated Goal: get better OT Goal Formulation: With patient Time For Goal Achievement: 07/04/24 Potential to Achieve Goals: Good   OT Frequency:  Min 2X/week    Co-evaluation PT/OT/SLP Co-Evaluation/Treatment: Yes Reason for Co-Treatment: For patient/therapist safety;To address functional/ADL transfers PT goals addressed during session: Mobility/safety with mobility;Balance;Proper use of DME OT goals addressed during session: ADL's and self-care      AM-PAC OT 6 Clicks Daily Activity     Outcome Measure Help from another  person eating meals?: None Help from another person taking care of personal grooming?: A Little Help from another person toileting, which includes using toliet, bedpan, or urinal?: A Lot Help from another person bathing (including washing,  rinsing, drying)?: A Lot Help from another person to put on and taking off regular upper body clothing?: A Little Help from another person to put on and taking off regular lower body clothing?: A Lot 6 Click Score: 16   End of Session Equipment Utilized During Treatment: Gait belt Nurse Communication: Mobility status;Other (comment) (transfer method)  Activity Tolerance: Patient tolerated treatment well Patient left: in chair;with call bell/phone within reach;with chair alarm set  OT Visit Diagnosis: Unsteadiness on feet (R26.81);Muscle weakness (generalized) (M62.81);Other symptoms and signs involving cognitive function;Pain Pain - Right/Left: Left Pain - part of body: Ankle and joints of foot                Time: 8747-8685 OT Time Calculation (min): 22 min Charges:  OT General Charges $OT Visit: 1 Visit OT Evaluation $OT Eval Moderate Complexity: 1 Mod  Elma JONETTA Lebron FREDERICK, OTR/L [facility] Acute Rehabilitation Office: [phone]   Elma JONETTA Lebron 06/20/2024, 2:36 PM

## 2024-06-20 NOTE — Progress Notes (Signed)

## 2024-06-20 NOTE — Evaluation (Addendum)
 Physical Therapy Evaluation Patient Details Name: Levi [name] MRN: [id number] DOB: 1945/12/10 Today's Date: 06/20/2024  History of Present Illness  79 y.o. male presents to [facility] 06/20/24 with progressive SOB, cough, and hypoxia. Admitted with acute hypoxic on chronic CHF. CTA chest showed small B plueral effusions w/ basilar atelectasis or consolidation as well as new compression deformities at T6/T7, possibly acute. PMH includes HTN, HLD, GERD, PAF, HFmrEF, depression, COPD, BPH, R BKA, peripheral neuropathy.   Clinical Impression  Pt lives at home with his spouse with HHPT/OT/RN. Pt reported using a slideboard to transfer to a manual WC. Pt has a R LE prosthetic, however, has been unable to don for gait due to infections/wounds. Pt presents below mobility baseline requiring MinA for bed mobility and MaxAx2 to squat pivot to the left. At this time, recommending >3hrs post acute rehab to decrease caregiver burden and improve mobility. Acute PT to follow.        If plan is discharge home, recommend the following: A lot of help with walking and/or transfers;A lot of help with bathing/dressing/bathroom;Assist for transportation;Help with stairs or ramp for entrance   Can travel by private vehicle   No    Equipment Recommendations None recommended by PT     Functional Status Assessment Patient has had a recent decline in their functional status and demonstrates the ability to make significant improvements in function in a reasonable and predictable amount of time.     Precautions / Restrictions Precautions Precautions: Fall Recall of Precautions/Restrictions: Intact Precaution/Restrictions Comments: chronic R BKA Restrictions Weight Bearing Restrictions Per Provider Order: No      Mobility  Bed Mobility Overal bed mobility: Needs Assistance Bed Mobility: Supine to Sit    Supine to sit: Min assist, HOB elevated    General bed mobility comments: increased time and effort with MinA  to steady    Transfers Overall transfer level: Needs assistance Equipment used: 1 person hand held assist Transfers: Bed to chair/wheelchair/BSC     Squat pivot transfers: Max assist, +2 physical assistance, +2 safety/equipment    General transfer comment: Squat pivot to the left with pt reaching for arm rest    Ambulation/Gait  General Gait Details: unable at baseline     Balance Overall balance assessment: Needs assistance, Mild deficits observed, not formally tested Sitting-balance support: No upper extremity supported, Feet supported Sitting balance-[name] Scale: Fair    Standing balance support: Bilateral upper extremity supported, During functional activity, Reliant on assistive device for balance Standing balance-[name] Scale: Zero       Pertinent Vitals/Pain Pain Assessment Pain Assessment: Faces Faces Pain Scale: Hurts even more Pain Location: R plantar surface of foot Pain Descriptors / Indicators: Discomfort, Aching Pain Intervention(s): Limited activity within patient's tolerance, Monitored during session, Repositioned    Home Living Family/patient expects to be discharged to:: Private residence Living Arrangements: Spouse/significant other   Type of Home: House Home Access: Ramped entrance    Home Layout: Able to live on main level with bedroom/bathroom Home Equipment: Control And Instrumentation Engineer (2 wheels);Cane - single point;Rollator (4 wheels);Wheelchair - manual;Toilet riser;Tub bench (drop arm BSC, slideboard) Additional Comments: has not been using prosthetic    Prior Function Prior Level of Function : Needs assist      Mobility Comments: most recently using a slide board. Has a R LE prosthetic, but has been unable to use due to wounds/infections ADLs Comments: wife assists PRN for ADLs; has aide 2x per week per report but unsure accuracy  Extremity/Trunk Assessment   Upper Extremity Assessment Upper Extremity Assessment: Defer to OT  evaluation    Lower Extremity Assessment Lower Extremity Assessment: RLE deficits/detail;LLE deficits/detail RLE Deficits / Details: chronic R BKA, WFL ROM RLE Sensation: decreased light touch;history of peripheral neuropathy (reported phantom limb sensation) LLE Deficits / Details: Grossly 4/5, limited ankle DF/PF AROM LLE Sensation: decreased light touch (not alert to sensation in digits)       Communication   Communication Communication: No apparent difficulties    Cognition Arousal: Alert Behavior During Therapy: WFL for tasks assessed/performed   PT - Cognitive impairments: No family/caregiver present to determine baseline, Memory, Problem solving, Safety/Judgement, Sequencing      Following commands: Impaired Following commands impaired: Follows multi-step commands with increased time, Only follows one step commands consistently     Cueing Cueing Techniques: Verbal cues, Tactile cues     General Comments General comments (skin integrity, edema, etc.): VSS on RA     PT Assessment Patient needs continued PT services  PT Problem List Decreased strength;Decreased range of motion;Decreased activity tolerance;Decreased balance;Decreased mobility;Decreased cognition;Decreased knowledge of use of DME;Decreased safety awareness       PT Treatment Interventions DME instruction;Functional mobility training;Therapeutic activities;Therapeutic exercise;Balance training;Patient/family education;Wheelchair mobility training;Neuromuscular re-education    PT Goals (Current goals can be found in the Care Plan section)  Acute Rehab PT Goals Patient Stated Goal: to get better PT Goal Formulation: With patient Time For Goal Achievement: 07/04/24 Potential to Achieve Goals: Good    Frequency Min 2X/week     Co-evaluation   Reason for Co-Treatment: For patient/therapist safety;To address functional/ADL transfers PT goals addressed during session: Mobility/safety with  mobility;Balance;Proper use of DME         AM-PAC PT 6 Clicks Mobility  Outcome Measure Help needed turning from your back to your side while in a flat bed without using bedrails?: A Little Help needed moving from lying on your back to sitting on the side of a flat bed without using bedrails?: A Little Help needed moving to and from a bed to a chair (including a wheelchair)?: Total Help needed standing up from a chair using your arms (e.g., wheelchair or bedside chair)?: Total Help needed to walk in hospital room?: Total Help needed climbing 3-5 steps with a railing? : Total 6 Click Score: 10    End of Session Equipment Utilized During Treatment: Gait belt Activity Tolerance: Patient tolerated treatment well Patient left: in chair;with call bell/phone within reach;with chair alarm set Nurse Communication: Mobility status;Other (comment) (+2 lateral scoot) PT Visit Diagnosis: Unsteadiness on feet (R26.81);Other abnormalities of gait and mobility (R26.89);Muscle weakness (generalized) (M62.81)    Time: 8746-8685 PT Time Calculation (min) (ACUTE ONLY): 21 min   Charges:   PT Evaluation $PT Eval Low Complexity: 1 Low   PT General Charges $$ ACUTE PT VISIT: 1 Visit       Kate ORN, PT, DPT Secure Chat Preferred  Rehab Office [phone]   Kate BRAVO Wendolyn 06/20/2024, 2:14 PM

## 2024-06-20 NOTE — Care Management Obs Status (Signed)
 MEDICARE OBSERVATION STATUS NOTIFICATION   Patient Details  Name: Levi [name] MRN: [id number] Date of Birth: [birth year]   Medicare Observation Status Notification Given:  Yes    Marval Gell, RN 06/20/2024, 10:27 AM

## 2024-06-20 NOTE — Assessment & Plan Note (Addendum)
 T6 and T7 possibly acute Plan to continue pain control with oral analgesics  Will double check if patient can have PT

## 2024-06-20 NOTE — Progress Notes (Signed)
 SPO2 on room air at night is 87%. Pt is an open-mouth breather. 3 LPM of NCL given, and able to maintain SPO2 above 92-100% at night time. He is alert and oriented x 4, afebrile, stable hemodynamically, NSR on the monitor, no acute distress noted. He is able to rest and sleep well with no major complaints.   Right stump BKA dressing is dry and clean, no drainage. Pt has generalized scratches, ecchymoses and minor skin tears on his arms and legs. Pt stated that these wounds happed because his granddaughter and his puppy dog played with him. Initial wound care was provided. Wound care team was consulted. Plan of care is reviewed. We will continue to monitor.   Wendi Dash, RN

## 2024-06-20 NOTE — Progress Notes (Addendum)
 " Progress Note   Patient: Levi [name] FMW:[id number] DOB: [birth year] DOA: 06/19/2024     0 DOS: the patient was seen and examined on 06/20/2024   Brief hospital course: Mr. [name] was admitted to the hospital with the working diagnosis of heart failure exacerbation.   79 year old male with extensive history of iron  deficiency anemia, arthritis, paroxysmal atrial fibrillation, heart failure, GERD, HLD, Gout, Depression hypothyroidism, BPH, chronic bronchitis, (agent orange exposure) COPD and recent osteomyelitis of lower extremity with BKA, who presented with dyspnea.   Patient wad worsening symptoms for the last 7 days prior to admission. As outpatient 01/22 he was diagnosed with pneumonia and was placed on doxycycline  for antibiotic therapy. Unfortunately his symptoms continue to worsen. On the day of admission he was noted to be confused, EMS was called and he was found with increased work of breathing and 02 saturation of 88% on room air. He was placed on 4 L/min per [place] and was transported to the ED.  On his initial physical examination his blood pressure was 162/72, HR 61, RR 15 02 saturation 100%  Lungs with no wheezing or rhonchi, positive rales, heart with S1 and S2 present and regular with no gallops or rubs, abdomen with no distention, positive lower extremity edema, on the left, noted right BKA    VBG 7.47/ 41.4/ 75/ 30/ 96% Na 140 K 3.0 Cl 105 bicarbonate 25 glucose 79 bun 25 cr 0,67  High sensitive troponin 54 and 52  Lactic acid 1,4 and 0,5  Pro calcitonin 0,24  Wbc 7,1 hgb 9,9 plt 173   Urine analysis SG 1,021, protein 30, hgb small and negative leukocytes   Chest radiograph with hypoinflation, positive cardiomegaly, bilateral hilar vascular congestion.   EKG 87 bpm, left axis deviation, qtc 478, normal intervals, sinus rhythm with PAC in a bigeminy pattern with no significant ST segment or T wave changes   CT chest with bilateral ground glass opacities, bilateral pleural  effusions.  No pulmonary embolism, new compression deformities at T6 and T7, possibly acute.   Assessment and Plan: * Acute on chronic diastolic CHF (congestive heart failure) (HCC) 01/2024 echocardiogram with mildly reduced LV systolic function 40 to 45%, with grade I diastolic dysfunction   Continue volume overloaded.  Documented urine output is 550 ml Systolic blood pressure 140 mmHg   Start patient on furosemide  60 mg IV bid Added losartan  and spironolactone  for blood pressure control and RAAS inhibition Discontinue isosorbide  Will check limited echocardiogram   Acute hypoxemic and hypercapnic respiratory failure due to acute cardiogenic pulmonary edema.  Pneumonia has been ruled out, (dc antibiotic therapy) Continue diuresis  02 saturation today 99% on 3 L/min per [place]   Paroxysmal atrial fibrillation (HCC) Continue rate control with diltiazem  and anticoagulation with apixaban    Hypokalemia Renal function stable with serum cr at 0,58 with K at 4.1 and serum bicarbonate at 27  Na 140   Plan to continue diuresis and follow up renal function and electrolytes in am.   Asthma-COPD overlap syndrome (HCC) No sings of acute exacerbation, will discontinue systemic corticosteroids Continue bronchodilator therapy  Patient has been on prednisone  for a long time   Hyperlipidemia Continue atorvastatin   GERD (gastroesophageal reflux disease) status post Nissen fundoplication Continue pantoprazole    BPH (benign prostatic hyperplasia) Continue Flomax   Compression deformity of vertebra T6 and T7 possibly acute Plan to continue pain control with oral analgesics  Will double check if patient can have PT   Seizures (HCC) Resume lamotrigine   Subjective: Patient with no chest pain, dyspnea and edema have improved but not yet back to baseline, no back pain   Physical Exam: Vitals:   06/20/24 0400 06/20/24 0406 06/20/24 0738 06/20/24 0827  BP: 139/73  (!) 152/83   Pulse: 92  93    Resp: 20  20   Temp: 97.7 F (36.5 C)  98 F (36.7 C)   TempSrc: Oral  Oral   SpO2: 94%  97% 99%  Weight:  85.5 kg    Height:       Neurology awake and alert,  ENT with mild pallor Cardiovascular with S1 and S2 present and regular with no gallops or rubs, positive systolic murmur at the left lower sternal border No JVD Respiratory with positive rales bilaterally with no wheezing or rhonchi Abdomen with no distention, soft and non tender Positive left lower extremity edema ++ Right BKA   Data Reviewed:    Family Communication: no family at the bedside. I spoke with his wife over the phone all questions were addressed.    Disposition: Status is: Observation The patient will require care spanning > 2 midnights and should be moved to inpatient because: IV diuresis   Planned Discharge Destination: Home    Author: Elidia Toribio Furnace, MD 06/20/2024 10:34 AM  For on call review [email or web address]  "

## 2024-06-21 DIAGNOSIS — K219 Gastro-esophageal reflux disease without esophagitis: Secondary | ICD-10-CM | POA: Diagnosis not present

## 2024-06-21 DIAGNOSIS — I5033 Acute on chronic diastolic (congestive) heart failure: Secondary | ICD-10-CM | POA: Diagnosis not present

## 2024-06-21 DIAGNOSIS — E7800 Pure hypercholesterolemia, unspecified: Secondary | ICD-10-CM | POA: Diagnosis not present

## 2024-06-21 DIAGNOSIS — I480 Paroxysmal atrial fibrillation: Secondary | ICD-10-CM | POA: Diagnosis not present

## 2024-06-21 DIAGNOSIS — R569 Unspecified convulsions: Secondary | ICD-10-CM

## 2024-06-21 DIAGNOSIS — E876 Hypokalemia: Secondary | ICD-10-CM | POA: Diagnosis not present

## 2024-06-21 DIAGNOSIS — M439 Deforming dorsopathy, unspecified: Secondary | ICD-10-CM

## 2024-06-21 DIAGNOSIS — J4489 Other specified chronic obstructive pulmonary disease: Secondary | ICD-10-CM | POA: Diagnosis not present

## 2024-06-21 DIAGNOSIS — N401 Enlarged prostate with lower urinary tract symptoms: Secondary | ICD-10-CM | POA: Diagnosis not present

## 2024-06-21 LAB — MRSA NEXT GEN BY PCR, NASAL: MRSA by PCR Next Gen: DETECTED — AB

## 2024-06-21 LAB — BASIC METABOLIC PANEL WITH GFR
Anion gap: 11 (ref 5–15)
BUN: 23 mg/dL (ref 8–23)
CO2: 27 mmol/L (ref 22–32)
Calcium: 8.5 mg/dL — ABNORMAL LOW (ref 8.9–10.3)
Chloride: 100 mmol/L (ref 98–111)
Creatinine, Ser: 0.67 mg/dL (ref 0.61–1.24)
GFR, Estimated: >60 mL/min
Glucose, Bld: 94 mg/dL (ref 70–99)
Potassium: 3.6 mmol/L (ref 3.5–5.1)
Sodium: 138 mmol/L (ref 135–145)

## 2024-06-21 LAB — MAGNESIUM: Magnesium: 1.9 mg/dL (ref 1.7–2.4)

## 2024-06-21 LAB — GLUCOSE, CAPILLARY: Glucose-Capillary: 86 mg/dL (ref 70–99)

## 2024-06-21 MED ORDER — RISAQUAD PO CAPS
1.0000 | ORAL_CAPSULE | Freq: Three times a day (TID) | ORAL | Status: DC
  Administered 2024-06-21 – 2024-06-23 (×9): 1 via ORAL
  Filled 2024-06-21 (×9): qty 1

## 2024-06-21 MED ORDER — FUROSEMIDE 40 MG PO TABS
40.0000 mg | ORAL_TABLET | Freq: Every day | ORAL | Status: DC
  Administered 2024-06-23: 40 mg via ORAL
  Filled 2024-06-21 (×2): qty 1

## 2024-06-21 MED ORDER — POTASSIUM CHLORIDE CRYS ER 20 MEQ PO TBCR
40.0000 meq | EXTENDED_RELEASE_TABLET | Freq: Once | ORAL | Status: AC
  Administered 2024-06-21: 40 meq via ORAL
  Filled 2024-06-21: qty 2

## 2024-06-21 MED ORDER — IPRATROPIUM BROMIDE 0.02 % IN SOLN: 0.5000 mg | Freq: Two times a day (BID) | RESPIRATORY_TRACT | Status: DC

## 2024-06-21 MED ORDER — MUPIROCIN 2 % EX OINT
1.0000 | TOPICAL_OINTMENT | Freq: Two times a day (BID) | CUTANEOUS | Status: DC
  Administered 2024-06-21 – 2024-06-23 (×3): 1 via NASAL
  Filled 2024-06-21 (×2): qty 22

## 2024-06-21 MED ORDER — CHLORHEXIDINE GLUCONATE CLOTH 2 % EX PADS
6.0000 | MEDICATED_PAD | Freq: Every day | CUTANEOUS | Status: DC
  Administered 2024-06-22 – 2024-06-23 (×2): 6 via TOPICAL

## 2024-06-21 MED ORDER — DULOXETINE HCL 30 MG PO CPEP
30.0000 mg | ORAL_CAPSULE | ORAL | Status: DC
  Administered 2024-06-23: 30 mg via ORAL
  Filled 2024-06-21: qty 1

## 2024-06-21 MED ORDER — MAGNESIUM SULFATE 2 GM/50ML IV SOLN
2.0000 g | Freq: Once | INTRAVENOUS | Status: AC
  Administered 2024-06-21: 2 g via INTRAVENOUS
  Filled 2024-06-21: qty 50

## 2024-06-21 MED ORDER — LEVALBUTEROL HCL 1.25 MG/0.5ML IN NEBU
1.2500 mg | INHALATION_SOLUTION | Freq: Two times a day (BID) | RESPIRATORY_TRACT | Status: DC
  Filled 2024-06-21: qty 0.5

## 2024-06-21 NOTE — Progress Notes (Signed)
 " Progress Note   Patient: Levi [name] FMW:[id number] DOB: 03/06/1946 DOA: 06/19/2024     1 DOS: the patient was seen and examined on 06/21/2024   Brief hospital course: Mr. Klabunde was admitted to the hospital with the working diagnosis of heart failure exacerbation.   79 year old male with extensive history of iron  deficiency anemia, arthritis, paroxysmal atrial fibrillation, heart failure, GERD, HLD, Gout, Depression hypothyroidism, BPH, chronic bronchitis, (agent orange exposure) COPD and recent osteomyelitis of lower extremity with BKA, who presented with dyspnea.   Patient wad worsening symptoms for the last 7 days prior to admission. As outpatient 01/22 he was diagnosed with pneumonia and was placed on doxycycline  for antibiotic therapy. Unfortunately his symptoms continue to worsen. On the day of admission he was noted to be confused, EMS was called and he was found with increased work of breathing and 02 saturation of 88% on room air. He was placed on 4 L/min per [place] and was transported to the ED.  On his initial physical examination his blood pressure was 162/72, HR 61, RR 15 02 saturation 100%  Lungs with no wheezing or rhonchi, positive rales, heart with S1 and S2 present and regular with no gallops or rubs, abdomen with no distention, positive lower extremity edema, on the left, noted right BKA    VBG 7.47/ 41.4/ 75/ 30/ 96% Na 140 K 3.0 Cl 105 bicarbonate 25 glucose 79 bun 25 cr 0,67  High sensitive troponin 54 and 52  Lactic acid 1,4 and 0,5  Pro calcitonin 0,24  Wbc 7,1 hgb 9,9 plt 173   Urine analysis SG 1,021, protein 30, hgb small and negative leukocytes   Chest radiograph with hypoinflation, positive cardiomegaly, bilateral hilar vascular congestion.   EKG 87 bpm, left axis deviation, qtc 478, normal intervals, sinus rhythm with PAC in a bigeminy pattern with no significant ST segment or T wave changes   CT chest with bilateral ground glass opacities, bilateral pleural  effusions.  No pulmonary embolism, new compression deformities at T6 and T7, possibly acute.   Patient placed on furosemide  for diuresis   01/26 improved volume status, possible discharge home in the next 24 hrs   Assessment and Plan: * Acute on chronic diastolic CHF (congestive heart failure) (HCC) 01/2024 echocardiogram with mildly reduced LV systolic function 40 to 45%, with grade I diastolic dysfunction  01/26 limited echocardiogram with no significant change.   Urine output is 2,950 ml Systolic blood pressure 100 mmHg   Continue with losartan  and spironolactone  for blood pressure control and RAAS inhibition Patient had one dose of furosemide  60 mg IV this morning Will transition to po furosemide  tomorrow 40 mg daily   Acute hypoxemic and hypercapnic respiratory failure due to acute cardiogenic pulmonary edema.  Pneumonia has been ruled out, (dc antibiotic therapy) Continue diuresis  02 saturation today 93% on room air at rest.   Paroxysmal atrial fibrillation (HCC) Continue rate control with diltiazem  and anticoagulation with apixaban    Hypokalemia Today renal function with serum cr at 0,67 with K at 3,6 and serum bicarbonate at 27  Na 138 and Mg 1.9   Will add 40 meq Kcl and 2 g Mg sulfate to avoid hypokalemia and hypomagnesemia  Transition to oral loop diuretic  Follow up renal function and electrolytes in am.   Asthma-COPD overlap syndrome (HCC) No signs of acute exacerbation  Continue bronchodilator therapy  Patient has been on prednisone  for a long time   Hyperlipidemia Continue atorvastatin   GERD (gastroesophageal reflux  disease) status post Nissen fundoplication Continue pantoprazole    BPH (benign prostatic hyperplasia) Continue Flomax   Compression deformity of vertebra T6 and T7 possibly acute Plan to continue pain control with oral analgesics  Not able to use his right lower extremity prosthesis due to local wound (note able to ambulate) Continue  local wound care   Seizures (HCC) Resume lamotrigine       Subjective: dyspnea has improved but not yet back to baseline, no chest pain, early this am confusion but not improved, his family is at the bedside   Physical Exam: Vitals:   06/21/24 0500 06/21/24 0733 06/21/24 0840 06/21/24 0844  BP:  129/88    Pulse: 69     Resp: 20     Temp:  (!) 97.3 F (36.3 C)    TempSrc:  Oral    SpO2: 93%  95% 99%  Weight: 83.6 kg     Height:       Neurology awake and alert ENT with mild pallor  Cardiovascular with S1 and S2 present and regular with no gallops, rubs or murmurs No JVD Respiratory with mild rales at bases with no wheezing or rhonchi Abdomen with no distention, soft and non tender Left lower extremity edema +, right with BKA  No thigh edema  Data Reviewed:    Family Communication: I spoke with patient's wife at the bedside, we talked in detail about patient's condition, plan of care and prognosis and all questions were addressed.   Disposition: Status is: Inpatient Remains inpatient appropriate because: IV diuresis   Planned Discharge Destination: Home     Author: Elidia Toribio Furnace, MD 06/21/2024 12:10 PM  For on call review [email or web address].  "

## 2024-06-21 NOTE — Progress Notes (Addendum)
 Inpatient Rehab Admissions Coordinator:   Left message for patient's spouse to discuss CIR recommendations.  Will follow.   1522: I spoke to pt's spouse on the phone to review rehab recommendations.  Pt has been with us  on CIR in July and again in Oct so they are familiar with the program and want to return.  I reviewed Medicare benefits and will check his hospital days.  Anticipate we should be able to admit in the next 1-2 days pending clearance from medical team and bed availability.    Reche Lowers, PT, DPT Admissions Coordinator [phone] 06/21/24 3:09 PM

## 2024-06-21 NOTE — Progress Notes (Addendum)
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 He is redirectable, follow commands, afebrile, stable hemodynamically, NSR on the monitor,  on 2-3 LPM of NCL, on and off due to Pt keeps pulling the monitor cable and O2 tube out. SPO2 88% on room air, on 2 LPM NCL SPO2 above 93-98 % at night, no acute distress noted overnight. He is able to rest and sleep well with no major complaints.   Pt has dry and non productive cough, therefore, we're unable to complete collecting sputum specimen for gram strain/ culture.  Pt refused to get lasix  60 mg at bedtime. Pt agreed to get lasix  at am. We postponed the schedule at 5 am instead of 11 pm.    Right stump BKA dressing is dry and clean, no drainage.  Plan of care is reviewed. We will continue to monitor.    Wendi Dash, RN     "

## 2024-06-21 NOTE — TOC Initial Note (Signed)
 Transition of Care [facility]) - Initial/Assessment Note    Patient Details  Name: Levi [name] MRN: [id number] Date of Birth: 1945-09-16  [facility] CM/SW Contact:    Waddell Barnie Rama, RN Phone Number: 06/21/2024, 4:38 PM  Clinical Narrative:                 From home with spouse, has PCP and insurance on file,  spouse states has [facility] services in place at this time  with [name] for HHRN, HHPT, HHOT , but they are going to CIR , has  walker, w/chair and hosp bed at home.  Family member will transport them home at [employer] and family is support system,  [facility] [place].  Pta self ambulatory with walker. CIR is following and plan to admit in a couple of days.    Expected Discharge Plan: IP Rehab Facility Barriers to Discharge: Continued Medical Work up   Patient Goals and CMS Choice Patient states their goals for this hospitalization and ongoing recovery are:: IP Rehab          Expected Discharge Plan and Services In-house Referral: NA Discharge Planning Services: CM Consult Post Acute Care Choice: IP Rehab Living arrangements for the past 2 months: Single Family Home                 DME Arranged: N/A DME Agency: NA       HH Arranged: NA          Prior Living Arrangements/Services Living arrangements for the past 2 months: Single Family Home Lives with:: Spouse Patient language and need for interpreter reviewed:: Yes Do you feel safe going back to the place where you live?: Yes      Need for Family Participation in Patient Care: Yes (Comment) Care giver support system in place?: Yes (comment) Current home services: DME, Home RN, Home PT, Home OT (walker, w/chair, hospbed, active with [name] for HHRN, HHPT, HHOT) Criminal Activity/Legal Involvement Pertinent to Current Situation/Hospitalization: No - Comment as needed  Activities of Daily Living   ADL Screening (condition at time of admission) Independently performs ADLs?:  No Does the patient have a NEW difficulty with bathing/dressing/toileting/self-feeding that is expected to last >3 days?: Yes (Initiates electronic notice to provider for possible OT consult) Does the patient have a NEW difficulty with getting in/out of bed, walking, or climbing stairs that is expected to last >3 days?: Yes (Initiates electronic notice to provider for possible PT consult) Does the patient have a NEW difficulty with communication that is expected to last >3 days?: No Is the patient deaf or have difficulty hearing?: No Does the patient have difficulty seeing, even when wearing glasses/contacts?: Yes Does the patient have difficulty concentrating, remembering, or making decisions?: No  Permission Sought/Granted Permission sought to share information with : Case Manager                Emotional Assessment       Orientation: : Oriented to Place, Oriented to Self, Oriented to  Time, Oriented to Situation Alcohol / Substance Use: Not Applicable Psych Involvement: No (comment)  Admission diagnosis:  Hypoxia [R09.02] Acute hypoxic on chronic hypercapnic respiratory failure (HCC) [J96.01, J96.12] Patient Active Problem List   Diagnosis Date Noted   Compression deformity of vertebra 06/20/2024   Seizures (HCC) 06/20/2024   Acute hypoxic on chronic hypercapnic respiratory failure (HCC) 06/19/2024   Malnutrition of moderate degree 03/12/2024   Acute on chronic diastolic CHF (congestive heart  failure) (HCC) 03/01/2024   Leukocytosis 03/01/2024   Debility 02/27/2024   MRSA bacteremia 02/22/2024   Left lower lobe pneumonia 02/21/2024   Hypokalemia 02/21/2024   Sacral wound, initial encounter 02/20/2024   CAP (community acquired pneumonia) 12/26/2023   Primary osteoarthritis, right shoulder 07/05/2022   BPH (benign prostatic hyperplasia) 02/21/2022   Acquired absence of right leg below knee (HCC) 01/22/2022   Peripheral neuropathy 02/22/2021   Chronic systolic heart  failure (HCC) 90/70/7977   Asthma-COPD overlap syndrome (HCC) 03/21/2020   GERD (gastroesophageal reflux disease) status post Nissen fundoplication 12/03/2017   Hyperlipidemia    Paroxysmal atrial fibrillation (HCC) 05/11/2012   Asthma, moderate persistent 03/06/2009   PCP:  [name] CROME, PA Pharmacy:   [phone] [place] [place] - [address] [place] [address] [place] [address] [place] [address] Phone: [phone] Fax: [phone]  [name] [address] [place] [address] Phone: [phone] Fax: [phone]     Social Drivers of Health (SDOH) Social History: SDOH Screenings   Food Insecurity: No Food Insecurity (06/19/2024)  Housing: Low Risk (06/19/2024)  Transportation Needs: No Transportation Needs (06/19/2024)  Utilities: Not At Risk (06/19/2024)  Alcohol Screen: Low Risk (03/28/2024)  Depression (PHQ2-9): High Risk (02/09/2024)  Financial Resource Strain: Low Risk (03/28/2024)  Physical Activity: Unknown (03/28/2024)  Recent Concern: Physical Activity - Inactive (02/05/2024)  Social Connections: Socially Integrated (06/19/2024)  Stress: No Stress Concern Present (03/28/2024)  Tobacco Use: Low Risk (06/19/2024)  Health Literacy: Adequate Health Literacy (10/23/2023)   SDOH Interventions:     Readmission Risk Interventions    06/21/2024    4:34 PM  Readmission Risk Prevention Plan  Transportation Screening Complete  Medication Review (RN Care Manager) Complete  PCP or Specialist appointment within 3-5 days of discharge Complete  HRI or Home Care Consult Complete  Palliative Care Screening Not Applicable  Skilled Nursing Facility Not Applicable

## 2024-06-21 NOTE — Plan of Care (Signed)
   Problem: Education: Goal: Knowledge of General Education information will improve Description: Including pain rating scale, medication(s)/side effects and non-pharmacologic comfort measures Outcome: Progressing   Problem: Clinical Measurements: Goal: Respiratory complications will improve Outcome: Progressing   Problem: Coping: Goal: Level of anxiety will decrease Outcome: Progressing

## 2024-06-21 NOTE — PMR Pre-admission (Signed)
 PMR Admission Coordinator Pre-Admission Assessment  Patient: Levi [name] is an 79 y.o., male MRN: [id number] DOB: 1945-12-20 Height: 6' 1 (185.4 cm) Weight: 83.6 kg  Insurance Information HMO:     PPO:      PCP:      IPA:      80/20:      OTHER:  PRIMARY: Medicare A/B      Policy#: [id number]       Subscriber: pt CM Name:       Phone#:      Fax#:  Pre-Cert#: verified Health And Safety Inspector:  Benefits:  Phone #:      Name:  Eff. Date: 03/27/12 A/B     Deduct: $1736      Out of Pocket Max:       Life Max:  CIR: 100%      SNF: 20 full days Outpatient: 80%     Co-Pay: 20% Home Health: 100%      Co-Pay:  DME: 80%     Co-Pay: 20% Providers:  SECONDARY: BCBS Other      Policy#BETHA Knudsen [id number]     Phone#: [phone]  Financial Counselor:       Phone#:   The Data Collection Information Summary for patients in Inpatient Rehabilitation Facilities with attached Privacy Act Statement-Health Care Records was provided and verbally reviewed with: Patient and Family  Emergency Contact Information Contact Information     Name Relation Home Work Mobile   [place] Spouse [phone]  [phone]   Delellis,Sally Daughter [phone]  [phone]      Other Contacts   None on File     Current Medical History  Patient Admitting Diagnosis: debility/CHF exacerbation   History of Present Illness: ***    Patient's medical record from [name] has been reviewed by the rehabilitation admission coordinator and physician.  Past Medical History  Past Medical History:  Diagnosis Date   Agent orange exposure 1970's   takes Imdur ,Diltiazem , and Atacand  daily   Agent orange exposure    ALLERGIC RHINITIS    Allergy     seasonal allergies   Anemia    iron  deficiency   Arthritis    Asthma    uses inhaler and nebulizers   Atrial fibrillation (HCC)    Bruises easily    d/t prednisone  daily   Cataract    Cellulitis 03/22/2020   Chest wall pain 09/21/2019   L scapula,  reported 4/27 21/     CHF (congestive heart failure) (HCC)    dx-on meds to treat   Chronic bronchitis (HCC)    used to get it q yr; last time ?2008 (04/28/2012)   Clotting disorder 2019   On blood thinner when I bleed it is for a consideral amount of time. This is tegardless of size of wound.   COPD (chronic obstructive pulmonary disease) (HCC)    agent orange exposure   Degenerative disk disease    everywhere (04/28/2012)   Depression due to physical illness 12/15/2023   Diverticulitis    Dysrhythmia    afib   Elevated uric acid in blood    takes Allopurinol  daily   Emphysema of lung (HCC)    Enlarged prostate    but not on any meds   Essential (primary) hypertension 11/15/2020   GERD (gastroesophageal reflux disease)    takes Protonix  daily   H/O hiatal hernia    Headache    History of colonic polyps 02/09/2013   01/2013 - 3 adenomas max  8 mm  01/2015 2 diminutive adenomas - repeat colon 2021     History of colonoscopy    History of pneumonia    a. 2010   History of total right hip arthroplasty 07/31/2015   Hyperlipidemia    takes Lipitor daily; pt. states he takes a preventive   Hypothyroidism    Joint pain    Joint swelling    Leukocytosis    Lumbar stenosis status post T12-S1 fusion 04/07/2017   MSSA bacteremia 03/19/2023   Nausea and vomiting 11/12/2022   Neuromuscular disorder (HCC)    bilat legs, and bilat arms   Neuropathy    Osteoarthritis of left knee 01/10/2012   Osteoporosis 2012   PAF (paroxysmal atrial fibrillation) (HCC)    a. Dx 04/2012, CHA2DS2VASc = 1 (age);  b. 04/2012 Echo: EF 40-50%, mild MR.   Personal history of colonic adenomas 02/09/2013   Pneumonia    Primary osteoarthritis of right knee 06/17/2014   Seizures (HCC) 06/20/2024   Sepsis (HCC) 03/18/2023   Shortness of breath dyspnea    Thyroid  disease    no taking meds-caused dizziness    Has the patient had major surgery during 100 days prior to admission? No  Family History    family history includes Asthma in his brother; COPD in his brother; Cancer in his brother and father; Colon cancer in his brother and another family member; Colon polyps in his brother and brother; Diabetes in his brother and mother; Heart disease in his father; Hyperlipidemia in his father; Prostate cancer in his brother; Stroke in his father.  Current Medications Current Medications[1]  Patients Current Diet:  Diet Order             Diet vegetarian Room service appropriate? Yes; Fluid consistency: Thin  Diet effective now                   Precautions / Restrictions Precautions Precautions: Fall Precaution/Restrictions Comments: chronic R BKA Restrictions Weight Bearing Restrictions Per Provider Order: Yes RLE Weight Bearing Per Provider Order: Non weight bearing   Has the patient had 2 or more falls or a fall with injury in the past year? No  Prior Activity Level Household: has been on CIR x2 and supervision/CGA w/c level after each admission; has been requiring increasing assist over the last two weeks.  Prior Functional Level Self Care: Did the patient need help bathing, dressing, using the toilet or eating? Needed some help  Indoor Mobility: Did the patient need assistance with walking from room to room (with or without device)? Needed some help  Stairs: Did the patient need assistance with internal or external stairs (with or without device)? Dependent  Functional Cognition: Did the patient need help planning regular tasks such as shopping or remembering to take medications? Needed some help  Patient Information Are you of Hispanic, Latino/a,or Spanish origin?: A. No, not of Hispanic, Latino/a, or Spanish origin What is your race?: A. White Do you need or want an interpreter to communicate with a doctor or health care staff?: 0. No  Patient's Response To:  Health Literacy and Transportation Is the patient able to respond to health literacy and transportation  needs?: Yes Health Literacy - How often do you need to have someone help you when you read instructions, pamphlets, or other written material from your doctor or pharmacy?: Never In the past 12 months, has lack of transportation kept you from medical appointments or from getting medications?: No In the past 12 months,  has lack of transportation kept you from meetings, work, or from getting things needed for daily living?: No  Journalist, Newspaper / Equipment Home Equipment: Shower seat, BSC/3in1, Agricultural Consultant (2 wheels), [employer] - single point, Rollator (4 wheels), Wheelchair - manual, Toilet riser, Tub bench (drop arm BSC, slideboard, sock aid)  Prior Device Use: Indicate devices/aids used by the patient prior to current illness, exacerbation or injury? Manual wheelchair, Walker, and Orthotics/Prosthetics  Current Functional Level Cognition  Orientation Level: Oriented X4    Extremity Assessment (includes Sensation/Coordination)  Upper Extremity Assessment: Generalized weakness  Lower Extremity Assessment: RLE deficits/detail, LLE deficits/detail RLE Deficits / Details: chronic R BKA, WFL ROM RLE Sensation: decreased light touch, history of peripheral neuropathy (reported phantom limb sensation) LLE Deficits / Details: Grossly 4/5, limited ankle DF/PF AROM LLE Sensation: decreased light touch (not alert to sensation in digits)    ADLs  Overall ADL's : Needs assistance/impaired Eating/Feeding: Set up, Sitting Grooming: Set up, Sitting Upper Body Bathing: Set up, Sitting Lower Body Bathing: Moderate assistance Upper Body Dressing : Set up, Sitting Lower Body Dressing: Maximal assistance Lower Body Dressing Details (indicate cue type and reason): 2/2 pt typically uses sock aide and none present in room Toilet Transfer: Maximal assistance, +2 for physical assistance, +2 for safety/equipment, Squat-pivot Functional mobility during ADLs: Maximal assistance, +2 for physical assistance, +2  for safety/equipment    Mobility  Overal bed mobility: Needs Assistance Bed Mobility: Supine to Sit Supine to sit: Min assist, HOB elevated General bed mobility comments: increased time and effort with MinA to steady    Transfers  Overall transfer level: Needs assistance Equipment used: 1 person hand held assist Transfers: Bed to chair/wheelchair/BSC Bed to/from chair/wheelchair/BSC transfer type:: Squat pivot Squat pivot transfers: Max assist, +2 physical assistance, +2 safety/equipment General transfer comment: Squat pivot to the left with pt reaching for arm rest    Ambulation / Gait / Stairs / Wheelchair Mobility  Ambulation/Gait General Gait Details: unable at baseline    Posture / Balance Balance Overall balance assessment: Needs assistance, Mild deficits observed, not formally tested Sitting-balance support: No upper extremity supported, Feet supported Sitting balance-[name] Scale: Fair Standing balance support: Bilateral upper extremity supported, During functional activity, Reliant on assistive device for balance Standing balance-[name] Scale: Zero    Special considerations/life events  Skin wound to R BKA residual limb and Diabetic management A1C 5.8   Previous Home Environment (from acute therapy documentation) Living Arrangements: Spouse/significant other Available Help at Discharge: Family, Available 24 hours/day Type of Home: House Home Layout: Able to live on main level with bedroom/bathroom Home Access: Ramped entrance Bathroom Shower/Tub: Health Visitor: Handicapped height Bathroom Accessibility: Yes How Accessible: Accessible via walker, Accessible via wheelchair Home Care Services: Yes Type of Home Care Services: Home OT, Home PT, Home RN, Homehealth aide Home Care Agency (if known): [facility] Additional Comments: has not been using prosthetic  Discharge Living Setting Plans for Discharge Living Setting: Patient's home, Lives with  (comment) (spouse) Type of Home at Discharge: House Discharge Home Layout: Able to live on main level with bedroom/bathroom Discharge Home Access: Ramped entrance Discharge Bathroom Shower/Tub: Walk-in shower Discharge Bathroom Toilet: Handicapped height Discharge Bathroom Accessibility: Yes How Accessible: Accessible via wheelchair Does the patient have any problems obtaining your medications?: No  Social/Family/Support Systems Patient Roles: Spouse Anticipated Caregiver: spouse, [name] Anticipated Industrial/product Designer Information: [phone] Ability/Limitations of Caregiver: none stated Caregiver Availability: 24/7 Discharge Plan Discussed with Primary Caregiver: Yes Is Caregiver In Agreement  with Plan?: Yes  Goals    Decrease burden of Care through IP rehab admission: n/a  Possible need for SNF placement upon discharge: No.  Plan to return home with spouse providing care as after previous admissions.   Patient Condition: I have reviewed medical records from [facility], spoken with [facility] team, and patient and spouse. I discussed via phone for inpatient rehabilitation assessment.  Patient will benefit from ongoing PT and OT, can actively participate in 3 hours of therapy a day 5 days of the week, and can make measurable gains during the admission.  Patient will also benefit from the coordinated team approach during an Inpatient Acute Rehabilitation admission.  The patient will receive intensive therapy as well as Rehabilitation physician, nursing, social worker, and care management interventions.  Due to safety, skin/wound care, disease management, medication administration, pain management, and patient education the patient requires 24 hour a day rehabilitation nursing.  The patient is currently max assist with mobility and basic ADLs.  Discharge setting and therapy post discharge at home with home health is anticipated.  Patient has agreed to participate in the Acute Inpatient  Rehabilitation Program and will admit ***.  Preadmission Screen Completed By:  Reche FORBES Lowers, 06/21/2024 3:31 PM ______________________________________________________________________   Discussed status with Dr. PIERRETTE on *** at *** and received approval for admission today.  Admission Coordinator:  Caitlin E Warren, PT, time PIERRETTEPattricia ***   Assessment/Plan: Diagnosis: *** Does the need for close, 24 hr/day Medical supervision in concert with the patient's rehab needs make it unreasonable for this patient to be served in a less intensive setting? {yes_no_potentially:3041433} Co-Morbidities requiring supervision/potential complications: *** Due to {due un:6958565}, does the patient require 24 hr/day rehab nursing? {yes_no_potentially:3041433} Does the patient require coordinated care of a physician, rehab nurse, PT, OT, and SLP to address physical and functional deficits in the context of the above medical diagnosis(es)? {yes_no_potentially:3041433} Addressing deficits in the following areas: {deficits:3041436} Can the patient actively participate in an intensive therapy program of at least 3 hrs of therapy 5 days a week? {yes_no_potentially:3041433} The potential for patient to make measurable gains while on inpatient rehab is {potential:3041437} Anticipated functional outcomes upon discharge from inpatient rehab: {functional outcomes:304600100} PT, {functional outcomes:304600100} OT, {functional outcomes:304600100} SLP Estimated rehab length of stay to reach the above functional goals is: *** Anticipated discharge destination: {anticipated dc setting:21604} 10. Overall Rehab/Functional Prognosis: {potential:3041437}   MD Signature: ***    [1]  Current Facility-Administered Medications:    acetaminophen  (TYLENOL ) tablet 650 mg, 650 mg, Oral, Q6H PRN **OR** acetaminophen  (TYLENOL ) suppository 650 mg, 650 mg, Rectal, Q6H PRN, Shahmehdi, Seyed A, MD   acidophilus (RISAQUAD) capsule 1  capsule, 1 capsule, Oral, TID with meals, Arrien, Mauricio Daniel, MD, 1 capsule at 06/21/24 1323   apixaban  (ELIQUIS ) tablet 5 mg, 5 mg, Oral, BID, Shahmehdi, Seyed A, MD, 5 mg at 06/21/24 1000   atorvastatin  (LIPITOR) tablet 20 mg, 20 mg, Oral, Daily, Shahmehdi, Seyed A, MD, 20 mg at 06/21/24 1000   benzonatate  (TESSALON ) capsule 100 mg, 100 mg, Oral, Q8H, Shahmehdi, Seyed A, MD, 100 mg at 06/21/24 1323   bisacodyl  (DULCOLAX) EC tablet 5 mg, 5 mg, Oral, Daily PRN, Shahmehdi, Seyed A, MD   cyclobenzaprine  (FLEXERIL ) tablet 5 mg, 5 mg, Oral, TID PRN, Shahmehdi, Seyed A, MD   diltiazem  (CARDIZEM  CD) 24 hr capsule 180 mg, 180 mg, Oral, Daily, Shahmehdi, Seyed A, MD, 180 mg at 06/21/24 1000   [START ON 06/23/2024] DULoxetine  (CYMBALTA ) DR capsule 30  mg, 30 mg, Oral, Q M,W,F, Arrien, Mauricio Daniel, MD   ferrous gluconate  (FERGON) tablet 324 mg, 324 mg, Oral, Daily, Shahmehdi, Seyed A, MD, 324 mg at 06/21/24 1001   [START ON 06/22/2024] furosemide  (LASIX ) tablet 40 mg, 40 mg, Oral, Daily, Arrien, Mauricio Daniel, MD   guaiFENesin  (ROBITUSSIN) 100 MG/5ML liquid 10 mL, 10 mL, Oral, Q8H, Shahmehdi, Seyed A, MD, 10 mL at 06/21/24 1323   ipratropium (ATROVENT ) nebulizer solution 0.5 mg, 0.5 mg, Nebulization, Q6H, Shahmehdi, Seyed A, MD, 0.5 mg at 06/21/24 0840   lamoTRIgine  (LAMICTAL ) tablet 25 mg, 25 mg, Oral, Daily, Arrien, Mauricio Daniel, MD, 25 mg at 06/21/24 1001   levalbuterol  (XOPENEX ) nebulizer solution 1.25 mg, 1.25 mg, Nebulization, Q6H, Shahmehdi, Seyed A, MD, 1.25 mg at 06/21/24 0840   losartan  (COZAAR ) tablet 25 mg, 25 mg, Oral, Daily, Arrien, Mauricio Daniel, MD, 25 mg at 06/21/24 1000   ondansetron  (ZOFRAN ) tablet 4 mg, 4 mg, Oral, Q6H PRN **OR** ondansetron  (ZOFRAN ) injection 4 mg, 4 mg, Intravenous, Q6H PRN, Shahmehdi, Seyed A, MD   oxyCODONE  (Oxy IR/ROXICODONE ) immediate release tablet 5 mg, 5 mg, Oral, Q4H PRN, Arrien, Mauricio Daniel, MD   pantoprazole  (PROTONIX ) EC tablet 40 mg, 40 mg,  Oral, BID AC, Shahmehdi, Seyed A, MD, 40 mg at 06/21/24 1000   predniSONE  (DELTASONE ) tablet 20 mg, 20 mg, Oral, Q breakfast, Arrien, Mauricio Daniel, MD, 20 mg at 06/21/24 0457   pregabalin  (LYRICA ) capsule 75 mg, 75 mg, Oral, Daily, Shahmehdi, Seyed A, MD, 75 mg at 06/21/24 1000   senna-docusate (Senokot-S) tablet 1 tablet, 1 tablet, Oral, QHS PRN, Shahmehdi, Seyed A, MD   sodium chloride  flush (NS) 0.9 % injection 3 mL, 3 mL, Intravenous, Q12H, Shahmehdi, Seyed A, MD, 3 mL at 06/21/24 1003   sodium chloride  flush (NS) 0.9 % injection 3 mL, 3 mL, Intravenous, Q12H, Shahmehdi, Seyed A, MD, 3 mL at 06/21/24 1003   spironolactone  (ALDACTONE ) tablet 12.5 mg, 12.5 mg, Oral, Daily, Arrien, Mauricio Daniel, MD, 12.5 mg at 06/21/24 1000   sulfamethoxazole -trimethoprim  (BACTRIM ) 400-80 MG per tablet 1 tablet, 1 tablet, Oral, BID, Arrien, Mauricio Daniel, MD, 1 tablet at 06/21/24 1002   tamsulosin  (FLOMAX ) capsule 0.4 mg, 0.4 mg, Oral, QHS, Shahmehdi, Seyed A, MD, 0.4 mg at 06/20/24 2120   traZODone  (DESYREL ) tablet 25 mg, 25 mg, Oral, QHS PRN, Shahmehdi, Seyed A, MD

## 2024-06-22 ENCOUNTER — Ambulatory Visit (HOSPITAL_BASED_OUTPATIENT_CLINIC_OR_DEPARTMENT_OTHER): Admitting: Pulmonary Disease

## 2024-06-22 DIAGNOSIS — N401 Enlarged prostate with lower urinary tract symptoms: Secondary | ICD-10-CM | POA: Diagnosis not present

## 2024-06-22 DIAGNOSIS — R569 Unspecified convulsions: Secondary | ICD-10-CM | POA: Diagnosis not present

## 2024-06-22 DIAGNOSIS — I5033 Acute on chronic diastolic (congestive) heart failure: Secondary | ICD-10-CM | POA: Diagnosis not present

## 2024-06-22 DIAGNOSIS — E7800 Pure hypercholesterolemia, unspecified: Secondary | ICD-10-CM | POA: Diagnosis not present

## 2024-06-22 DIAGNOSIS — J4489 Other specified chronic obstructive pulmonary disease: Secondary | ICD-10-CM | POA: Diagnosis not present

## 2024-06-22 DIAGNOSIS — M439 Deforming dorsopathy, unspecified: Secondary | ICD-10-CM | POA: Diagnosis not present

## 2024-06-22 DIAGNOSIS — K219 Gastro-esophageal reflux disease without esophagitis: Secondary | ICD-10-CM | POA: Diagnosis not present

## 2024-06-22 DIAGNOSIS — I480 Paroxysmal atrial fibrillation: Secondary | ICD-10-CM | POA: Diagnosis not present

## 2024-06-22 DIAGNOSIS — E876 Hypokalemia: Secondary | ICD-10-CM | POA: Diagnosis not present

## 2024-06-22 LAB — BASIC METABOLIC PANEL WITH GFR
Anion gap: 10 (ref 5–15)
BUN: 23 mg/dL (ref 8–23)
CO2: 30 mmol/L (ref 22–32)
Calcium: 8.5 mg/dL — ABNORMAL LOW (ref 8.9–10.3)
Chloride: 100 mmol/L (ref 98–111)
Creatinine, Ser: 0.84 mg/dL (ref 0.61–1.24)
GFR, Estimated: >60 mL/min
Glucose, Bld: 76 mg/dL (ref 70–99)
Potassium: 3.9 mmol/L (ref 3.5–5.1)
Sodium: 140 mmol/L (ref 135–145)

## 2024-06-22 LAB — GLUCOSE, CAPILLARY: Glucose-Capillary: 77 mg/dL (ref 70–99)

## 2024-06-22 LAB — MAGNESIUM: Magnesium: 2.4 mg/dL (ref 1.7–2.4)

## 2024-06-22 MED ORDER — LEVALBUTEROL HCL 1.25 MG/0.5ML IN NEBU: 1.2500 mg | INHALATION_SOLUTION | Freq: Four times a day (QID) | RESPIRATORY_TRACT | Status: DC | PRN

## 2024-06-22 NOTE — Plan of Care (Signed)
  Problem: Clinical Measurements: Goal: Respiratory complications will improve Outcome: Progressing Goal: Cardiovascular complication will be avoided Outcome: Progressing   Problem: Nutrition: Goal: Adequate nutrition will be maintained Outcome: Progressing   Problem: Elimination: Goal: Will not experience complications related to bowel motility Outcome: Progressing

## 2024-06-22 NOTE — Progress Notes (Addendum)
 Inpatient Rehab Admissions Coordinator:   I have no beds available for this patient to admit to CIR today.  Will continue to follow for timing of potential admission pending bed availability.   1031: updated pt's spouse via phone.   Reche Lowers, PT, DPT Admissions Coordinator [phone] 06/22/24 9:25 AM

## 2024-06-22 NOTE — Progress Notes (Signed)
 " Progress Note   Patient: Levi [name] FMW:[id number] DOB: December 24, 1945 DOA: 06/19/2024     2 DOS: the patient was seen and examined on 06/22/2024   Brief hospital course: Mr. Proch was admitted to the hospital with the working diagnosis of heart failure exacerbation.   79 year old male with extensive history of iron  deficiency anemia, arthritis, paroxysmal atrial fibrillation, heart failure, GERD, HLD, Gout, Depression hypothyroidism, BPH, chronic bronchitis, (agent orange exposure) COPD and recent osteomyelitis of lower extremity with BKA, who presented with dyspnea.   Patient wad worsening symptoms for the last 7 days prior to admission. As outpatient 01/22 he was diagnosed with pneumonia and was placed on doxycycline  for antibiotic therapy. Unfortunately his symptoms continue to worsen. On the day of admission he was noted to be confused, EMS was called and he was found with increased work of breathing and 02 saturation of 88% on room air. He was placed on 4 L/min per [place] and was transported to the ED.  On his initial physical examination his blood pressure was 162/72, HR 61, RR 15 02 saturation 100%  Lungs with no wheezing or rhonchi, positive rales, heart with S1 and S2 present and regular with no gallops or rubs, abdomen with no distention, positive lower extremity edema, on the left, noted right BKA    VBG 7.47/ 41.4/ 75/ 30/ 96% Na 140 K 3.0 Cl 105 bicarbonate 25 glucose 79 bun 25 cr 0,67  High sensitive troponin 54 and 52  Lactic acid 1,4 and 0,5  Pro calcitonin 0,24  Wbc 7,1 hgb 9,9 plt 173   Urine analysis SG 1,021, protein 30, hgb small and negative leukocytes   Chest radiograph with hypoinflation, positive cardiomegaly, bilateral hilar vascular congestion.   EKG 87 bpm, left axis deviation, qtc 478, normal intervals, sinus rhythm with PAC in a bigeminy pattern with no significant ST segment or T wave changes   CT chest with bilateral ground glass opacities, bilateral pleural  effusions.  No pulmonary embolism, new compression deformities at T6 and T7, possibly acute.   Patient placed on furosemide  for diuresis   01/26 improved volume status, possible discharge home in the next 24 hrs  01/27 clinically euvolemic state, pending bed available at CIR. Patient medically stable.   Assessment and Plan: * Acute on chronic diastolic CHF (congestive heart failure) (HCC) 01/2024 echocardiogram with mildly reduced LV systolic function 40 to 45%, with grade I diastolic dysfunction  01/26 limited echocardiogram with no significant change.   Urine output is 1,500  ml, patient has lost 6 kg since admission with improvement of his symptoms.  Systolic blood pressure 110 mmHg range   Continue with losartan  and spironolactone  for blood pressure control and RAAS inhibition Continue 40 mg daily of furosemide  for diuresis  Will check if he can use a compression sock on his left leg.    Acute hypoxemic and hypercapnic respiratory failure due to acute cardiogenic pulmonary edema.  Pneumonia has been ruled out, (dc antibiotic therapy) Continue diuresis  02 saturation today 93% on room air at rest.   Paroxysmal atrial fibrillation (HCC) Continue rate control with diltiazem  and anticoagulation with apixaban    Hypokalemia Renal function stable with serum cr at 0,84 with K at 3,9 and serum bicarbonate at 30  Na 140 and Mg 2,4   Continue furosemide  40 mg po daily Follow up renal function and electrolytes in am.   Asthma-COPD overlap syndrome (HCC) No signs of acute exacerbation  Continue bronchodilator therapy  Patient has been  on prednisone  for a long time   Hyperlipidemia Continue atorvastatin   GERD (gastroesophageal reflux disease) status post Nissen fundoplication Continue pantoprazole    BPH (benign prostatic hyperplasia) Continue Flomax   Compression deformity of vertebra T6 and T7 possibly acute Plan to continue pain control with oral analgesics  Not able to use  his right lower extremity prosthesis due to local wound (note able to ambulate) Continue local wound care   Seizures (HCC) Continue with lamotrigine        Subjective: Patient is feeling better, no chest pain, dyspnea at baseline, significant improvement in lower extremity edema, no PND or orthopnea   Physical Exam: Vitals:   06/21/24 2358 06/22/24 0334 06/22/24 0732 06/22/24 1151  BP: 124/75 128/63 130/72 114/74  Pulse: 80 73 79 (!) 102  Resp: 20 20 20 20   Temp: 97.8 F (36.6 C) 97.7 F (36.5 C) (!) 97.5 F (36.4 C) 98.1 F (36.7 C)  TempSrc: Oral Oral Oral Oral  SpO2: 93% 91% 94% 94%  Weight:  82.5 kg    Height:       Neurology awake and alert ENT with mild pallor with no icterus Cardiovascular with S1 and S2 present and regular with no gallops, rubs or murmurs Respiratory with prolonged expiratory phase with scattered rhonchi with no wheezing or rales Abdomen with no distention, soft and non tender Right BKA, left lower extremity with trace edema at the ankle   Data Reviewed:    Family Communication: family at the bedside   Disposition: Status is: Inpatient Remains inpatient appropriate because: pending transfer to CIR   Planned Discharge Destination: CIR      Author: Elidia Toribio Furnace, MD 06/22/2024 1:12 PM  For on call review [email or web address].  "

## 2024-06-22 NOTE — Progress Notes (Signed)
 Physical Therapy Treatment Patient Details Name: Levi [name] MRN: [id number] DOB: [birth year] Today's Date: 06/22/2024   History of Present Illness 79 y.o. male admitted 06/19/24 with progressive SOB, cough. Workup for acute hypoxic respiratory failure, CHF exacerbation. Chest CT with bilateral pleural effusions; new compression deformities T6-7, possibly acute. PMH includes R BKA with recent osteomyelitis, COPD, HF, chronic bronchitis, agent orange exposure, PAF, anemia, gout, depression.   PT Comments  Pt progressing with mobility. Today's session focused on transfer training; pt requiring modA for lateral scooting, unable to stand on LLE with assist +1 (currently unable to wear RLE prosthetic due to residual limb wounds). Pt remains limited by generalized weakness, decreased activity tolerance, and impaired balance strategies/postural reactions. Continue to recommend intensive post-acute rehab services (> 3 hrs/day) to maximize functional mobility and independence prior to return home.     If plan is discharge home, recommend the following: A lot of help with walking and/or transfers;A lot of help with bathing/dressing/bathroom;Assist for transportation;Help with stairs or ramp for entrance   Can travel by private vehicle     No  Equipment Recommendations  None recommended by PT    Recommendations for Other Services  Mobility Specialist     Precautions / Restrictions Precautions Precautions: Fall Recall of Precautions/Restrictions: Intact Precaution/Restrictions Comments: potential acute compression deformities T6-T7; h/o R BKA     Mobility  Bed Mobility Overal bed mobility: Needs Assistance Bed Mobility: Supine to Sit     Supine to sit: Min assist, HOB elevated, Used rails     General bed mobility comments: minA for HHA to elevate trunk, increased time and effort    Transfers Overall transfer level: Needs assistance Equipment used: None, Rolling walker (2  wheels) Transfers: Bed to chair/wheelchair/BSC, Sit to/from Stand            Lateral/Scoot Transfers: Mod assist General transfer comment: pt declines use of slide board, lateral scoot from bed>drop arm recliner on R-side with min-modA to assist scooting hips with bed pad; L foot frequently sliding out from under pt despite tennis shoe donned. trialled 3x sit>stand from recliner to RW, pt able to offload buttocks but unable to transition UE support to RW despite maxA for trunk elevation    Ambulation/Gait                   Stairs             Wheelchair Mobility     Tilt Bed    Modified Rankin (Stroke Patients Only)       Balance Overall balance assessment: Needs assistance, Mild deficits observed, not formally tested Sitting-balance support: No upper extremity supported, Feet supported Sitting balance-[name] Scale: Fair Sitting balance - Comments: requires assist to don L shoe       Standing balance comment: unable to achieve standing with assist +1                            Communication Communication Communication: No apparent difficulties  Cognition Arousal: Alert Behavior During Therapy: WFL for tasks assessed/performed, Flat affect                           PT - Cognition Comments: WFL for simple tasks, not formally assessed; generally decreased awareness Following commands: Impaired Following commands impaired: Follows multi-step commands with increased time, Only follows one step commands consistently    Cueing Cueing Techniques: Verbal cues,  Tactile cues  Exercises Other Exercises Other Exercises: chair push-ups in recliner; reviewed supine/seated BLE therex/AROM    General Comments General comments (skin integrity, edema, etc.): SpO2 95% on RA when reliable pleth reading. daughter present and supportive. reviewed educ re: role of acute PT, POC, activity recommendations, importance of mobility, therex/AROM       Pertinent Vitals/Pain Pain Assessment Pain Assessment: Faces Faces Pain Scale: Hurts a little bit Pain Location: chronic R shoulder Pain Descriptors / Indicators: Discomfort, Guarding Pain Intervention(s): Monitored during session, Limited activity within patient's tolerance    Home Living                          Prior Function            PT Goals (current goals can now be found in the care plan section) Progress towards PT goals: Progressing toward goals    Frequency    Min 2X/week      PT Plan      Co-evaluation              AM-PAC PT 6 Clicks Mobility   Outcome Measure  Help needed turning from your back to your side while in a flat bed without using bedrails?: A Little Help needed moving from lying on your back to sitting on the side of a flat bed without using bedrails?: A Little Help needed moving to and from a bed to a chair (including a wheelchair)?: A Lot Help needed standing up from a chair using your arms (e.g., wheelchair or bedside chair)?: Total Help needed to walk in hospital room?: Total Help needed climbing 3-5 steps with a railing? : Total 6 Click Score: 11    End of Session   Activity Tolerance: Patient tolerated treatment well Patient left: in chair;with call bell/phone within reach;with chair alarm set;with family/visitor present Nurse Communication: Mobility status;Other (comment) (confirmed transfer details with pt's RN) PT Visit Diagnosis: Unsteadiness on feet (R26.81);Other abnormalities of gait and mobility (R26.89);Muscle weakness (generalized) (M62.81)     Time: 8941-8869 PT Time Calculation (min) (ACUTE ONLY): 32 min  Charges:    $Therapeutic Activity: 23-37 mins PT General Charges $$ ACUTE PT VISIT: 1 Visit                     Darice Almas, PT, DPT Acute Rehabilitation Services  Personal: Secure Chat Rehab Office: [phone]  Darice LITTIE Almas 06/22/2024, 1:00 PM

## 2024-06-22 NOTE — Progress Notes (Signed)
 Heart Failure Navigator Progress Note  Assessed for Heart & Vascular TOC clinic readiness.  Patient does not meet criteria due to per Dr. Noralee, No HF TOC. Will follow up with his PCP on 06/28/2024. .   Navigator will sign off at this time.   Stephane Haddock, BSN, Scientist, Clinical (histocompatibility And Immunogenetics) Only

## 2024-06-22 NOTE — Plan of Care (Signed)
  Problem: Education: Goal: Knowledge of General Education information will improve Description: Including pain rating scale, medication(s)/side effects and non-pharmacologic comfort measures Outcome: Progressing   Problem: Health Behavior/Discharge Planning: Goal: Ability to manage health-related needs will improve Outcome: Progressing   Problem: Clinical Measurements: Goal: Cardiovascular complication will be avoided Outcome: Progressing   Problem: Skin Integrity: Goal: Risk for impaired skin integrity will decrease Outcome: Progressing

## 2024-06-22 NOTE — Progress Notes (Incomplete)
 " PROGRESS NOTE    Levi [name]  FMW:[id number] DOB: Jan 06, 1946 DOA: 06/19/2024 PCP: [name] CROME, PA  79/M w h/o iron  deficiency anemia, arthritis, PAH, dCHF, GERD, HLD, Gout, Depression hypothyroidism, BPH, chronic bronchitis, (agent orange exposure) COPD and recent osteomyelitis of lower extremity with BKA, who presented with dyspnea.  EMS was called and he was found with increased work of breathing and 02 saturation of 88% on room air. He was placed on 4 L/min per [place] and was transported to the ED.  positive lower extremity edema, on the left, noted right BKA   cr 0,67 , troponin 54 and 52  Wbc 7,1 hgb 9,9 plt 173  Chest radiograph with hypoinflation, positive cardiomegaly, bilateral hilar vascular congestion.  CT chest with bilateral ground glass opacities, bilateral pleural effusions.  No pulmonary embolism, new compression deformities at T6 and T7, possibly acute.  Patient placed on furosemide  for diuresis  1/26 improved volume status, possible discharge home in the next 24 hrs  1/27 clinically euvolemic state, pending bed available at [facility]. Patient medically stable.   Subjective:   Assessment and Plan:  Acute on chronic diastolic CHF (congestive heart failure) (HCC) -01/2024 echocardiogram with mildly reduced LV systolic function 40 to 45%, with grade I diastolic dysfunction , 1/26 limited echocardiogram with no significant change.  -Continue with losartan  and spironolactone  for blood pressure control and RAAS inhibition Continue 40 mg daily of furosemide  for diuresis  - compression sock on his left leg.    Acute hypoxemic and hypercapnic respiratory failure due to acute cardiogenic pulmonary edema.  Pneumonia has been ruled out, (dc antibiotic therapy) Continue diuresis  02 saturation today 93% on room air at rest.   Paroxysmal atrial fibrillation (HCC) Continue rate control with diltiazem  and anticoagulation with apixaban    Hypokalemia -repleted  Asthma-COPD overlap  syndrome (HCC) No signs of acute exacerbation  Continue bronchodilator therapy  Patient has been on prednisone  for a long time   Hyperlipidemia Continue atorvastatin   GERD (gastroesophageal reflux disease) status post Nissen fundoplication Continue pantoprazole    BPH (benign prostatic hyperplasia) Continue Flomax   Compression deformity of vertebra T6 and T7 possibly acute Plan to continue pain control with oral analgesics  Not able to use his right lower extremity prosthesis due to local wound (note able to ambulate) Continue local wound care   Seizures (HCC) Continue with lamotrigine     DVT prophylaxis: apixaban  Code Status: FUll Family Communication: Disposition Plan:   Consultants:    Procedures:   Antimicrobials:    Objective: Vitals:   06/22/24 0334 06/22/24 0732 06/22/24 1151 06/22/24 1537  BP: 128/63 130/72 114/74 126/71  Pulse: 73 79 (!) 102 81  Resp: 20 20 20 20   Temp: 97.7 F (36.5 C) (!) 97.5 F (36.4 C) 98.1 F (36.7 C) 97.8 F (36.6 C)  TempSrc: Oral Oral Oral Oral  SpO2: 91% 94% 94% 98%  Weight: 82.5 kg     Height:        Intake/Output Summary (Last 24 hours) at 06/22/2024 1721 Last data filed at 06/22/2024 0916 Gross per 24 hour  Intake 240 ml  Output 100 ml  Net 140 ml   Filed Weights   06/20/24 0406 06/21/24 0500 06/22/24 0334  Weight: 85.5 kg 83.6 kg 82.5 kg    Examination:     Data Reviewed:   CBC: Recent Labs  Lab 06/19/24 0904 06/19/24 0931 06/20/24 0225  WBC  --  7.1 6.3  NEUTROABS  --  4.6  --  HGB 10.5* 9.9* 10.4*  HCT 31.0* 32.3* 34.1*  MCV  --  94.2 92.4  PLT  --  173 147*   Basic Metabolic Panel: Recent Labs  Lab 06/19/24 0904 06/19/24 1046 06/20/24 0225 06/21/24 0007 06/22/24 0224  NA 141 140 140 138 140  K 3.3* 3.0* 4.1 3.6 3.9  CL  --  105 104 100 100  CO2  --  25 27 27 30   GLUCOSE  --  79 79 94 76  BUN  --  25* 24* 23 23  CREATININE  --  0.67 0.58* 0.67 0.84  CALCIUM   --  8.5* 8.7* 8.5*  8.5*  MG  --  1.8  --  1.9 2.4  PHOS  --  2.8  --   --   --    GFR: Estimated Creatinine Clearance: 83.2 mL/min (by C-G formula based on SCr of 0.84 mg/dL). Liver Function Tests: Recent Labs  Lab 06/19/24 1046  AST 32  ALT 33  ALKPHOS 111  BILITOT 0.4  PROT 5.0*  ALBUMIN 3.1*   No results for input(s): LIPASE, AMYLASE in the last 168 hours. No results for input(s): AMMONIA in the last 168 hours. Coagulation Profile: Recent Labs  Lab 06/19/24 0931  INR 1.2   Cardiac Enzymes: No results for input(s): CKTOTAL, CKMB, CKMBINDEX, TROPONINI in the last 168 hours. BNP (last 3 results) Recent Labs    06/19/24 0931  PROBNP 341.0*   HbA1C: No results for input(s): HGBA1C in the last 72 hours. CBG: Recent Labs  Lab 06/21/24 0503 06/22/24 0601  GLUCAP 86 77   Lipid Profile: No results for input(s): CHOL, HDL, LDLCALC, TRIG, CHOLHDL, LDLDIRECT in the last 72 hours. Thyroid  Function Tests: No results for input(s): TSH, T4TOTAL, FREET4, T3FREE, THYROIDAB in the last 72 hours. Anemia Panel: No results for input(s): VITAMINB12, FOLATE, FERRITIN, TIBC, IRON , RETICCTPCT in the last 72 hours. Urine analysis:    Component Value Date/Time   COLORURINE AMBER (A) 06/19/2024 1223   APPEARANCEUR HAZY (A) 06/19/2024 1223   APPEARANCEUR Cloudy (A) 11/03/2023 1113   LABSPEC 1.021 06/19/2024 1223   PHURINE 5.0 06/19/2024 1223   GLUCOSEU NEGATIVE 06/19/2024 1223   HGBUR SMALL (A) 06/19/2024 1223   BILIRUBINUR NEGATIVE 06/19/2024 1223   BILIRUBINUR Negative 11/03/2023 1113   KETONESUR NEGATIVE 06/19/2024 1223   PROTEINUR 30 (A) 06/19/2024 1223   UROBILINOGEN 0.2 05/02/2022 0916   UROBILINOGEN 0.2 04/21/2012 0936   NITRITE NEGATIVE 06/19/2024 1223   LEUKOCYTESUR NEGATIVE 06/19/2024 1223   Sepsis Labs: @LABRCNTIP (procalcitonin:4,lacticidven:4)  ) Recent Results (from the past 240 hours)  Blood Culture (routine x 2)     Status:  None (Preliminary result)   Collection Time: 06/19/24  9:31 AM   Specimen: BLOOD RIGHT ARM  Result Value Ref Range Status   Specimen Description BLOOD RIGHT ARM  Final   Special Requests   Final    BOTTLES DRAWN AEROBIC AND ANAEROBIC Blood Culture adequate volume   Culture   Final    NO GROWTH 3 DAYS Performed at [facility], [address], [place], [place] [address]    Report Status PENDING  Incomplete  Blood Culture (routine x 2)     Status: None (Preliminary result)   Collection Time: 06/19/24  9:37 AM   Specimen: BLOOD RIGHT HAND  Result Value Ref Range Status   Specimen Description BLOOD RIGHT HAND  Final   Special Requests AEROBIC BOTTLE ONLY Blood Culture adequate volume  Final   Culture  Final    NO GROWTH 3 DAYS Performed at [facility], [address] [place], [place] [address]    Report Status PENDING  Incomplete  MRSA Next Gen by PCR, Nasal     Status: Abnormal   Collection Time: 06/21/24  1:50 PM   Specimen: Nasal Mucosa; Nasal Swab  Result Value Ref Range Status   MRSA by PCR Next Gen DETECTED (A) NOT DETECTED Final    Comment: RESULT CALLED TO, READ BACK BY AND VERIFIED WITH: RN P. LIPKA [phone] @ 1742 FH (NOTE) The GeneXpert MRSA Assay (FDA approved for NASAL specimens only), is one component of a comprehensive MRSA colonization surveillance program. It is not intended to diagnose MRSA infection nor to guide or monitor treatment for MRSA infections. Test performance is not FDA approved in patients less than 74 years old. Performed at [facility], [address] [place], [place] [address]      Radiology Studies: No results found.   Scheduled Meds:  acidophilus  1 capsule Oral TID with meals   apixaban   5 mg Oral BID   atorvastatin   20 mg Oral Daily   benzonatate   100 mg Oral Q8H   Chlorhexidine  Gluconate Cloth  6 each Topical Daily   diltiazem   180 mg Oral Daily   [START ON 06/23/2024] DULoxetine   30 mg Oral Q M,W,F   ferrous  gluconate  324 mg Oral Daily   furosemide   40 mg Oral Daily   guaiFENesin   10 mL Oral Q8H   lamoTRIgine   25 mg Oral Daily   losartan   25 mg Oral Daily   mupirocin  ointment  1 Application Nasal BID   pantoprazole   40 mg Oral BID AC   predniSONE   20 mg Oral Q breakfast   pregabalin   75 mg Oral Daily   sodium chloride  flush  3 mL Intravenous Q12H   sodium chloride  flush  3 mL Intravenous Q12H   spironolactone   12.5 mg Oral Daily   sulfamethoxazole -trimethoprim   1 tablet Oral BID   tamsulosin   0.4 mg Oral QHS   Continuous Infusions:   LOS: 2 days    Time spent:    Sigurd Pac, MD [facility]   06/22/2024, 5:21 PM    "

## 2024-06-23 ENCOUNTER — Inpatient Hospital Stay (HOSPITAL_COMMUNITY)
Admission: AD | Admit: 2024-06-23 | DRG: 945 | Disposition: A | Discharge status: Home / Self Care | Source: Intra-hospital | Attending: Physical Medicine & Rehabilitation | Admitting: Physical Medicine & Rehabilitation

## 2024-06-23 DIAGNOSIS — E785 Hyperlipidemia, unspecified: Secondary | ICD-10-CM

## 2024-06-23 DIAGNOSIS — R5381 Other malaise: Secondary | ICD-10-CM | POA: Diagnosis not present

## 2024-06-23 DIAGNOSIS — I480 Paroxysmal atrial fibrillation: Secondary | ICD-10-CM | POA: Diagnosis not present

## 2024-06-23 DIAGNOSIS — E876 Hypokalemia: Secondary | ICD-10-CM

## 2024-06-23 DIAGNOSIS — N400 Enlarged prostate without lower urinary tract symptoms: Secondary | ICD-10-CM | POA: Diagnosis not present

## 2024-06-23 DIAGNOSIS — I5033 Acute on chronic diastolic (congestive) heart failure: Secondary | ICD-10-CM | POA: Diagnosis not present

## 2024-06-23 DIAGNOSIS — K219 Gastro-esophageal reflux disease without esophagitis: Secondary | ICD-10-CM | POA: Diagnosis not present

## 2024-06-23 DIAGNOSIS — G629 Polyneuropathy, unspecified: Secondary | ICD-10-CM | POA: Diagnosis not present

## 2024-06-23 LAB — RENAL FUNCTION PANEL
Albumin: 3.2 g/dL — ABNORMAL LOW (ref 3.5–5.0)
Anion gap: 8 (ref 5–15)
BUN: 23 mg/dL (ref 8–23)
CO2: 29 mmol/L (ref 22–32)
Calcium: 8.6 mg/dL — ABNORMAL LOW (ref 8.9–10.3)
Chloride: 103 mmol/L (ref 98–111)
Creatinine, Ser: 0.76 mg/dL (ref 0.61–1.24)
GFR, Estimated: >60 mL/min
Glucose, Bld: 90 mg/dL (ref 70–99)
Phosphorus: 3.2 mg/dL (ref 2.5–4.6)
Potassium: 4.3 mmol/L (ref 3.5–5.1)
Sodium: 140 mmol/L (ref 135–145)

## 2024-06-23 LAB — GLUCOSE, CAPILLARY: Glucose-Capillary: 72 mg/dL (ref 70–99)

## 2024-06-23 MED ORDER — PREDNISONE 10 MG PO TABS
15.0000 mg | ORAL_TABLET | Freq: Every day | ORAL | Status: AC
  Administered 2024-06-24 – 2024-07-02 (×9): 15 mg via ORAL
  Filled 2024-06-23 (×9): qty 2

## 2024-06-23 MED ORDER — LEVALBUTEROL HCL 1.25 MG/0.5ML IN NEBU: 1.2500 mg | INHALATION_SOLUTION | Freq: Four times a day (QID) | RESPIRATORY_TRACT | Status: AC | PRN

## 2024-06-23 MED ORDER — PROCHLORPERAZINE MALEATE 5 MG PO TABS: 5.0000 mg | ORAL_TABLET | Freq: Four times a day (QID) | ORAL | Status: AC | PRN

## 2024-06-23 MED ORDER — ATORVASTATIN CALCIUM 10 MG PO TABS
20.0000 mg | ORAL_TABLET | Freq: Every day | ORAL | Status: DC
  Administered 2024-06-24 – 2024-06-29 (×6): 20 mg via ORAL
  Filled 2024-06-23 (×6): qty 2

## 2024-06-23 MED ORDER — FLEET ENEMA RE ENEM: 1.0000 | ENEMA | Freq: Once | RECTAL | Status: AC | PRN

## 2024-06-23 MED ORDER — PROCHLORPERAZINE 25 MG RE SUPP: 12.5000 mg | Freq: Four times a day (QID) | RECTAL | Status: AC | PRN

## 2024-06-23 MED ORDER — TRAZODONE HCL 50 MG PO TABS
25.0000 mg | ORAL_TABLET | Freq: Every evening | ORAL | Status: AC | PRN
  Administered 2024-06-25 – 2024-07-02 (×6): 25 mg via ORAL
  Filled 2024-06-23 (×6): qty 1

## 2024-06-23 MED ORDER — DILTIAZEM HCL ER COATED BEADS 180 MG PO CP24
180.0000 mg | ORAL_CAPSULE | Freq: Every day | ORAL | Status: DC
  Administered 2024-06-24 – 2024-06-30 (×6): 180 mg via ORAL
  Filled 2024-06-23 (×7): qty 1

## 2024-06-23 MED ORDER — PREGABALIN 50 MG PO CAPS
75.0000 mg | ORAL_CAPSULE | Freq: Every day | ORAL | Status: DC
  Administered 2024-06-24 – 2024-06-29 (×6): 75 mg via ORAL
  Filled 2024-06-23 (×6): qty 1

## 2024-06-23 MED ORDER — ALUM & MAG HYDROXIDE-SIMETH 200-200-20 MG/5ML PO SUSP
30.0000 mL | ORAL | Status: AC | PRN
  Administered 2024-06-25: 30 mL via ORAL
  Filled 2024-06-23: qty 30

## 2024-06-23 MED ORDER — TAMSULOSIN HCL 0.4 MG PO CAPS
0.4000 mg | ORAL_CAPSULE | Freq: Every day | ORAL | Status: AC
  Administered 2024-06-24 – 2024-07-02 (×10): 0.4 mg via ORAL
  Filled 2024-06-23 (×10): qty 1

## 2024-06-23 MED ORDER — GUAIFENESIN-DM 100-10 MG/5ML PO SYRP: 5.0000 mL | ORAL_SOLUTION | Freq: Four times a day (QID) | ORAL | Status: AC | PRN

## 2024-06-23 MED ORDER — SULFAMETHOXAZOLE-TRIMETHOPRIM 400-80 MG PO TABS
1.0000 | ORAL_TABLET | Freq: Two times a day (BID) | ORAL | Status: DC
  Administered 2024-06-24 – 2024-06-30 (×14): 1 via ORAL
  Filled 2024-06-23 (×14): qty 1

## 2024-06-23 MED ORDER — DIPHENHYDRAMINE HCL 25 MG PO CAPS: 25.0000 mg | ORAL_CAPSULE | Freq: Four times a day (QID) | ORAL | Status: AC | PRN

## 2024-06-23 MED ORDER — PANTOPRAZOLE SODIUM 40 MG PO TBEC
40.0000 mg | DELAYED_RELEASE_TABLET | Freq: Two times a day (BID) | ORAL | Status: AC
  Administered 2024-06-24 – 2024-07-02 (×18): 40 mg via ORAL
  Filled 2024-06-23 (×19): qty 1

## 2024-06-23 MED ORDER — PREDNISONE 5 MG PO TABS: 15.0000 mg | ORAL_TABLET | Freq: Every day | ORAL | Status: DC

## 2024-06-23 MED ORDER — PREDNISONE 10 MG PO TABS: 15.0000 mg | ORAL_TABLET | Freq: Every day | ORAL | Status: AC

## 2024-06-23 MED ORDER — SPIRONOLACTONE 12.5 MG HALF TABLET
12.5000 mg | ORAL_TABLET | Freq: Every day | ORAL | Status: AC
  Administered 2024-06-24 – 2024-07-02 (×9): 12.5 mg via ORAL
  Filled 2024-06-23 (×9): qty 1

## 2024-06-23 MED ORDER — LAMOTRIGINE 25 MG PO TABS
25.0000 mg | ORAL_TABLET | Freq: Every day | ORAL | Status: AC
  Administered 2024-06-24 – 2024-07-02 (×9): 25 mg via ORAL
  Filled 2024-06-23 (×10): qty 1

## 2024-06-23 MED ORDER — RISAQUAD PO CAPS: 1.0000 | ORAL_CAPSULE | Freq: Three times a day (TID) | ORAL | Status: AC

## 2024-06-23 MED ORDER — NON FORMULARY: 1.2500 mg | Freq: Four times a day (QID) | Status: DC | PRN

## 2024-06-23 MED ORDER — BISACODYL 10 MG RE SUPP: 10.0000 mg | Freq: Every day | RECTAL | Status: AC | PRN

## 2024-06-23 MED ORDER — VITAMIN C 500 MG PO TABS
500.0000 mg | ORAL_TABLET | Freq: Every day | ORAL | Status: AC
  Administered 2024-06-24 – 2024-07-02 (×9): 500 mg via ORAL
  Filled 2024-06-23 (×10): qty 1

## 2024-06-23 MED ORDER — SENNOSIDES-DOCUSATE SODIUM 8.6-50 MG PO TABS: 2.0000 | ORAL_TABLET | Freq: Every evening | ORAL | Status: AC | PRN

## 2024-06-23 MED ORDER — ACETAMINOPHEN 325 MG PO TABS: 325.0000 mg | ORAL_TABLET | ORAL | Status: AC | PRN

## 2024-06-23 MED ORDER — FERROUS GLUCONATE 324 (38 FE) MG PO TABS
324.0000 mg | ORAL_TABLET | Freq: Every day | ORAL | Status: DC
  Administered 2024-06-24 – 2024-06-29 (×6): 324 mg via ORAL
  Filled 2024-06-23 (×6): qty 1

## 2024-06-23 MED ORDER — PROCHLORPERAZINE EDISYLATE 10 MG/2ML IJ SOLN: 5.0000 mg | Freq: Four times a day (QID) | INTRAMUSCULAR | Status: AC | PRN

## 2024-06-23 MED ORDER — FUROSEMIDE 40 MG PO TABS
40.0000 mg | ORAL_TABLET | Freq: Every day | ORAL | Status: DC
  Administered 2024-06-24: 40 mg via ORAL
  Filled 2024-06-23 (×2): qty 1

## 2024-06-23 MED ORDER — RISAQUAD PO CAPS
1.0000 | ORAL_CAPSULE | Freq: Three times a day (TID) | ORAL | Status: AC
  Administered 2024-06-24 – 2024-07-02 (×27): 1 via ORAL
  Filled 2024-06-23 (×29): qty 1

## 2024-06-23 MED ORDER — APIXABAN 5 MG PO TABS
5.0000 mg | ORAL_TABLET | Freq: Two times a day (BID) | ORAL | Status: AC
  Administered 2024-06-24 – 2024-07-02 (×19): 5 mg via ORAL
  Filled 2024-06-23 (×19): qty 1

## 2024-06-23 MED ORDER — JUVEN PO PACK
1.0000 | PACK | Freq: Two times a day (BID) | ORAL | Status: AC
  Administered 2024-06-24 – 2024-07-02 (×15): 1 via ORAL
  Filled 2024-06-23 (×17): qty 1

## 2024-06-23 MED ORDER — DULOXETINE HCL 30 MG PO CPEP
30.0000 mg | ORAL_CAPSULE | ORAL | Status: DC
  Administered 2024-06-25 – 2024-06-28 (×2): 30 mg via ORAL
  Filled 2024-06-23 (×2): qty 1

## 2024-06-23 MED ORDER — GUAIFENESIN 100 MG/5ML PO LIQD
10.0000 mL | Freq: Three times a day (TID) | ORAL | Status: AC
  Administered 2024-06-24 – 2024-07-01 (×18): 10 mL via ORAL
  Filled 2024-06-23 (×28): qty 10

## 2024-06-23 MED ORDER — SPIRONOLACTONE 25 MG PO TABS: 12.5000 mg | ORAL_TABLET | Freq: Every day | ORAL | Status: AC

## 2024-06-23 MED ORDER — BENZONATATE 100 MG PO CAPS
100.0000 mg | ORAL_CAPSULE | Freq: Three times a day (TID) | ORAL | Status: AC
  Administered 2024-06-24 – 2024-07-01 (×18): 100 mg via ORAL
  Filled 2024-06-23 (×23): qty 1

## 2024-06-23 MED ORDER — OXYCODONE HCL 5 MG PO TABS
5.0000 mg | ORAL_TABLET | ORAL | Status: AC | PRN
  Administered 2024-07-02: 5 mg via ORAL
  Filled 2024-06-23 (×2): qty 1

## 2024-06-23 MED ORDER — MUPIROCIN 2 % EX OINT
1.0000 | TOPICAL_OINTMENT | Freq: Two times a day (BID) | CUTANEOUS | Status: AC
  Administered 2024-06-24 – 2024-06-26 (×6): 1 via NASAL
  Filled 2024-06-23: qty 22

## 2024-06-23 MED ORDER — CYCLOBENZAPRINE HCL 5 MG PO TABS: 5.0000 mg | ORAL_TABLET | Freq: Three times a day (TID) | ORAL | Status: AC | PRN

## 2024-06-23 MED ORDER — CHLORHEXIDINE GLUCONATE CLOTH 2 % EX PADS
6.0000 | MEDICATED_PAD | Freq: Every day | CUTANEOUS | Status: AC
  Administered 2024-06-24 – 2024-06-25 (×2): 6 via TOPICAL

## 2024-06-23 MED ORDER — KATE FARMS STANDARD 1.4 PO LIQD
325.0000 mL | Freq: Three times a day (TID) | ORAL | Status: DC
  Filled 2024-06-23 (×3): qty 325

## 2024-06-23 MED ORDER — FUROSEMIDE 40 MG PO TABS: 40.0000 mg | ORAL_TABLET | Freq: Every day | ORAL | Status: AC

## 2024-06-23 NOTE — TOC Transition Note (Signed)
 Transition of Care [facility]) - Discharge Note   Patient Details  Name: Levi [name] MRN: [id number] Date of Birth: April 21, 1946  [facility]) CM/SW Contact:  Waddell Barnie Rama, RN Phone Number: 06/23/2024, 3:41 PM   Clinical Narrative:    For dc to CIR , they have a bed for him today.   Final next level of care: IP Rehab Facility Barriers to Discharge: Continued Medical Work up   Patient Goals and CMS Choice Patient states their goals for this hospitalization and ongoing recovery are:: IP Rehab          Discharge Placement                       Discharge Plan and Services Additional resources added to the After Visit Summary for   In-house Referral: NA Discharge Planning Services: CM Consult Post Acute Care Choice: IP Rehab          DME Arranged: N/A DME Agency: NA       HH Arranged: NA          Social Drivers of Health (SDOH) Interventions SDOH Screenings   Food Insecurity: No Food Insecurity (06/19/2024)  Housing: Low Risk (06/19/2024)  Transportation Needs: No Transportation Needs (06/19/2024)  Utilities: Not At Risk (06/19/2024)  Alcohol Screen: Low Risk (03/28/2024)  Depression (PHQ2-9): High Risk (02/09/2024)  Financial Resource Strain: Low Risk (03/28/2024)  Physical Activity: Unknown (03/28/2024)  Recent Concern: Physical Activity - Inactive (02/05/2024)  Social Connections: Socially Integrated (06/19/2024)  Stress: No Stress Concern Present (03/28/2024)  Tobacco Use: Low Risk (06/19/2024)  Health Literacy: Adequate Health Literacy (10/23/2023)     Readmission Risk Interventions    06/21/2024    4:34 PM  Readmission Risk Prevention Plan  Transportation Screening Complete  Medication Review (RN Care Manager) Complete  PCP or Specialist appointment within 3-5 days of discharge Complete  HRI or Home Care Consult Complete  Palliative Care Screening Not Applicable  Skilled Nursing Facility Not Applicable

## 2024-06-23 NOTE — Discharge Summary (Signed)
 Physician Discharge Summary  Levi [name] FMW:[id number] DOB: [birth year] DOA: 06/19/2024  PCP: [name] CROME, PA  Admit date: 06/19/2024 Discharge date: 06/23/2024  Time spent:45 minutes  Recommendations for Outpatient Follow-up:  Pulmonary Dr. Kassie in 1 week, on chronic prednisone  therapy for COPD Orthopedics Dr. Harden in 1 to 2 weeks CIR for short-term rehab   Discharge Diagnoses:  Principal Problem:   Acute on chronic diastolic CHF (congestive heart failure) (HCC) Acute hypoxic and hypercarbic respiratory failure   Paroxysmal atrial fibrillation (HCC)   Hypokalemia   Asthma-COPD overlap syndrome (HCC)   Hyperlipidemia   GERD (gastroesophageal reflux disease) status post Nissen fundoplication   BPH (benign prostatic hyperplasia)   Compression deformity of vertebra   Seizures (HCC) Right below-knee amputation  Discharge Condition: Improved  Diet recommendation: Heart healthy  Filed Weights   06/21/24 0500 06/22/24 0334 06/23/24 0422  Weight: 83.6 kg 82.5 kg 81.5 kg    History of present illness:  78/M w h/o iron  deficiency anemia, arthritis, PAH, dCHF, GERD, HLD, Gout, Depression hypothyroidism, BPH, chronic bronchitis, (agent orange exposure) COPD and recent osteomyelitis of lower extremity with BKA, who presented with dyspnea.  EMS was called and he was found with increased work of breathing and 02 saturation of 88% on room air. He was placed on 4 L/min per [place] and was transported to the ED.  positive lower extremity edema, on the left, noted right BKA   cr 0,67 , troponin 54 and 52  Wbc 7,1 hgb 9,9 plt 173  Chest radiograph with hypoinflation, positive cardiomegaly, bilateral hilar vascular congestion.  CT chest with bilateral ground glass opacities, bilateral pleural effusions.  No pulmonary embolism, new compression deformities at T6 and T7, possibly acute.  Patient placed on furosemide  for diuresis  1/26 improved volume status, possible discharge home in the  next 24 hrs  1/27 clinically euvolemic state, pending bed availability at CIR.   Hospital Course:   Acute on chronic diastolic CHF (congestive heart failure) (HCC) -01/2024 echocardiogram with mildly reduced LV systolic function 40 to 45%, with grade I diastolic dysfunction , 1/26 limited echocardiogram with no significant change.  -Improved with diuresis, now switched to oral Lasix , low-dose Aldactone  -Once he comes off Bactrim  consider low-dose ARB - compression sock on his left leg.   -Will send referral to [facility] heart care -Plan for CIR for short-term rehab   Acute hypoxemic and hypercapnic respiratory failure due to acute cardiogenic pulmonary edema.  Pneumonia has been ruled out, (dc antibiotic therapy) With diuresis, now weaned off O2   Paroxysmal atrial fibrillation (HCC) -Currently in sinus rhythm, continue diltiazem  and apixaban    Hypokalemia -repleted   Asthma-COPD overlap syndrome (HCC) No signs of acute exacerbation  Continue bronchodilator therapy  Patient has been on prednisone  for a long time, most recently on 15 mg daily, continue this until follow-up with Dr. Kassie, on Bactrim  while taking prednisone    Hyperlipidemia Continue atorvastatin    GERD (gastroesophageal reflux disease) status post Nissen fundoplication Continue pantoprazole     BPH (benign prostatic hyperplasia) Continue Flomax   Right below-knee amputation -Stump unremarkable, has a small wound just inferior to right knee   Compression deformity of vertebra T6 and T7 possibly acute Plan to continue pain control with oral analgesics  Not able to use his right lower extremity prosthesis due to small wound lateral to his knee Continue local wound care    Seizures (HCC) Continue with lamotrigine    Discharge Exam: Vitals:   06/23/24 0308 06/23/24 0745  BP:  134/87 (!) 142/74  Pulse: 77 (!) 38  Resp: 19 (!) 21  Temp: 98 F (36.7 C)   SpO2: 98% 93%   Gen: Awake, Alert, Oriented X 3,   HEENT: no JVD Lungs: Good air movement bilaterally, CTAB CVS: S1S2/RRR Abd: soft, Non tender, non distended, BS present Extremities: No edema, right BKA, small penny sized wound near his right knee Skin: As above  Discharge Instructions    Allergies as of 06/23/2024       Reactions   Levaquin  [levofloxacin ] Itching   Tolerated ciprofloxacin  in 2022.        Medication List     STOP taking these medications    doxycycline  100 MG capsule Commonly known as: VIBRAMYCIN    isosorbide  mononitrate 30 MG 24 hr tablet Commonly known as: IMDUR    nutrition supplement (JUVEN) Pack   sulfamethoxazole -trimethoprim  400-80 MG tablet Commonly known as: Bactrim        TAKE these medications    acetaminophen  325 MG tablet Commonly known as: TYLENOL  Take 1-2 tablets (325-650 mg total) by mouth every 4 (four) hours as needed for mild pain (pain score 1-3).   acidophilus Caps capsule Take 1 capsule by mouth with breakfast, with lunch, and with evening meal for 10 days.   albuterol  108 (90 Base) MCG/ACT inhaler Commonly known as: VENTOLIN  HFA INHALE 2 PUFFS BY MOUTH EVERY 4 HOURS AS NEEDED FOR WHEEZE OR FOR SHORTNESS OF BREATH   allopurinol  100 MG tablet Commonly known as: ZYLOPRIM  Take 100 mg by mouth daily.   apixaban  5 MG Tabs tablet Commonly known as: Eliquis  Take 1 tablet (5 mg total) by mouth 2 (two) times daily.   atorvastatin  20 MG tablet Commonly known as: LIPITOR Take 1 tablet (20 mg total) by mouth daily.   benzonatate  100 MG capsule Commonly known as: TESSALON  Take 1 capsule (100 mg total) by mouth every 8 (eight) hours.   cyanocobalamin  1000 MCG/ML injection Commonly known as: VITAMIN B12 Inject 1,000 mcg into the skin every 30 (thirty) days.   cyclobenzaprine  10 MG tablet Commonly known as: FLEXERIL  TAKE 1 TABLET BY MOUTH THREE TIMES A DAY AS NEEDED FOR MUSCLE SPASMS   diltiazem  180 MG 24 hr capsule Commonly known as: Cardizem  CD Take 1 capsule (180  mg total) by mouth daily.   DULoxetine  30 MG capsule Commonly known as: CYMBALTA  Take 30 mg by mouth 2 (two) times daily.   escitalopram  10 MG tablet Commonly known as: LEXAPRO  Take 1 tablet (10 mg total) by mouth daily.   ferrous gluconate  324 MG tablet Commonly known as: FERGON Take 1 tablet (324 mg total) by mouth daily.   furosemide  40 MG tablet Commonly known as: LASIX  Take 1 tablet (40 mg total) by mouth daily.   ipratropium-albuterol  0.5-2.5 (3) MG/3ML Soln Commonly known as: DUONEB 1 NEBULE EVERY 6 HOURS AS NEEDED. **J45.3**   lamoTRIgine  25 MG tablet Commonly known as: LAMICTAL  Take by mouth.   Olodaterol HCl 2.5 MCG/ACT Aers Inhale 2 Inhalations into the lungs daily. Brand name Striverdi   oxyCODONE  5 MG immediate release tablet Commonly known as: Oxy IR/ROXICODONE  Take 1 tablet (5 mg total) by mouth every 4 (four) hours as needed for severe pain (pain score 7-10) or moderate pain (pain score 4-6).   pantoprazole  40 MG tablet Commonly known as: PROTONIX  Take 1 tablet (40 mg total) by mouth 2 (two) times daily before a meal.   predniSONE  10 MG tablet Commonly known as: DELTASONE  Take 1.5 tablets (15 mg total) by  mouth daily with breakfast. What changed:  how much to take Another medication with the same name was removed. Continue taking this medication, and follow the directions you see here.   pregabalin  150 MG capsule Commonly known as: Lyrica  Take 1 capsule (150 mg total) by mouth 2 (two) times daily.   pregabalin  75 MG capsule Commonly known as: Lyrica  Take 1 capsule (75 mg total) by mouth daily.   spironolactone  25 MG tablet Commonly known as: ALDACTONE  Take 0.5 tablets (12.5 mg total) by mouth daily.   Stiolto Respimat  2.5-2.5 MCG/ACT Aers Generic drug: Tiotropium Bromide-Olodaterol Inhale 2 puffs into the lungs daily.   tamsulosin  0.4 MG Caps capsule Commonly known as: FLOMAX  Take 1 capsule (0.4 mg total) by mouth at bedtime.   Vitamin  D3 1.25 MG (50000 UT) Caps Take 1 capsule by mouth once a week.       Allergies[1]  Follow-up Information     [name] CROME, PA Follow up on 06/28/2024.   Specialty: Physician Assistant Why: 10:40 for hospital follow up Contact information: [address] [place] [address] [phone]                  The results of significant diagnostics from this hospitalization (including imaging, microbiology, ancillary and laboratory) are listed below for reference.    Significant Diagnostic Studies: ECHOCARDIOGRAM LIMITED Result Date: 06/20/2024    ECHOCARDIOGRAM LIMITED REPORT   Patient Name:   Levi [name] Date of Exam: 06/20/2024 Medical Rec #:  [id number]     Height:       73.0 in Accession #:    [id number]    Weight:       188.5 lb Date of Birth:  July 01, 1945     BSA:          2.098 m Patient Age:    78 years      BP:           144/76 mmHg Patient Gender: M             HR:           81 bpm. Exam Location:  Inpatient Procedure: Limited Echo (Both Spectral and Color Flow Doppler were utilized            during procedure). Indications:    Dyspnea R06.00  History:        Patient has prior history of Echocardiogram examinations, most                 recent 02/25/2024. AFIB; Signs/Symptoms:Dyspnea.  Sonographer:    Nathanel Devonshire Referring Phys: [id number] MAURICIO DANIEL ARRIEN IMPRESSIONS  1. Left ventricular ejection fraction, by estimation, is 45 to 50%. Left ventricular ejection fraction by 2D MOD biplane is 45.4 %. The left ventricle has mildly decreased function.  2. Mild mitral valve regurgitation.  3. The aortic valve is calcified. Aortic valve regurgitation is mild. Aortic valve sclerosis/calcification is present, without any evidence of aortic stenosis. Comparison(s): No significant change from prior study. Prior images reviewed side by side. FINDINGS  Left Ventricle: Left ventricular ejection fraction, by estimation, is 45 to 50%. Left ventricular ejection fraction by 2D MOD  biplane is 45.4 %. The left ventricle has mildly decreased function. The left ventricular internal cavity size was normal in size. There is no left ventricular hypertrophy. Mitral Valve: Mild mitral valve regurgitation. Aortic Valve: The aortic valve is calcified. Aortic valve regurgitation is mild. Aortic valve sclerosis/calcification is present, without any evidence  of aortic stenosis. LEFT VENTRICLE PLAX 2D                        Biplane EF (MOD) LVIDd:         5.70 cm         LV Biplane EF:   Left LVIDs:         4.20 cm                          ventricular LV PW:         0.90 cm                          ejection LV IVS:        1.00 cm                          fraction by                                                 2D MOD                                                 biplane is LV Volumes (MOD)                                45.4 %. LV vol d, MOD    94.4 ml A2C: LV vol d, MOD    106.0 ml A4C: LV vol s, MOD    56.5 ml A2C: LV vol s, MOD    52.9 ml A4C: LV SV MOD A2C:   37.9 ml LV SV MOD A4C:   106.0 ml LV SV MOD BP:    46.5 ml Franck Azobou Tonleu Electronically signed by Joelle Cedars Tonleu Signature Date/Time: 06/20/2024/3:02:24 PM    Final    CT Angio Chest PE W and/or Wo Contrast Result Date: 06/19/2024 EXAM: CTA of the Chest with contrast for PE 06/19/2024 02:44:16 PM TECHNIQUE: CTA of the chest was performed after the administration of 75 mL of iohexol  (OMNIPAQUE ) 350 MG/ML injection. Multiplanar reformatted images are provided for review. MIP images are provided for review. Automated exposure control, iterative reconstruction, and/or weight based adjustment of the mA/kV was utilized to reduce the radiation dose to as low as reasonably achievable. COMPARISON: Comparison with chest radiograph 06/19/2024 and CT chest 02/22/2024. CLINICAL HISTORY: Pulmonary embolism (PE) suspected, high prob. Pulmonary embolism suspected, high probability. FINDINGS: LIMITATIONS/ARTIFACTS: Moderate motion artifact.  PULMONARY ARTERIES: Pulmonary arteries are adequately opacified for evaluation. No pulmonary embolism. Main pulmonary artery is normal in caliber. MEDIASTINUM: Mild cardiac enlargement. No pericardial effusions. Normal caliber thoracic aorta. No dissection. LYMPH NODES: No mediastinal, hilar or axillary lymphadenopathy. LUNGS AND PLEURA: Small bilateral pleural effusions with basilar atelectasis or consolidation bilaterally. No pneumothorax. UPPER ABDOMEN: Limited images of the upper abdomen are unremarkable. SOFT TISSUES AND BONES: Postoperative changes in the cervical spine and left shoulder. Degenerative changes in the spine. Compression deformities at T6 and T7 with about 40% loss of height at T6 and 60%  loss of height at T7. This is new since the prior comparison CT and could indicate acute compression. Old rib fractures. IMPRESSION: 1. No pulmonary embolism. 2. Small bilateral pleural effusions with basilar atelectasis or consolidation bilaterally. 3. Mild cardiac enlargement. No pericardial effusion. 4. New compression deformities at T6 and T7, possibly acute. Electronically signed by: Elsie Gravely MD 06/19/2024 03:10 PM EST RP Workstation: [id number]   DG Chest Port 1 View Result Date: 06/19/2024 EXAM: 1 VIEW XRAY OF THE CHEST 06/19/2024 08:43:00 AM COMPARISON: 06/17/2024 CLINICAL HISTORY: 79 year old male with questionable sepsis. recently treated for pneumonia, increased confusion FINDINGS: LUNGS AND PLEURA: Low lung volumes. Linear opacities overlying right lower lung zones. Bilateral blunting of costophrenic angles. No focal pulmonary opacity. No pneumothorax. HEART AND MEDIASTINUM: No acute abnormality of the cardiac and mediastinal silhouettes. BONES AND SOFT TISSUES: Cervical spinal fixation hardware noted. Left shoulder arthroplasty noted. Lumbar spinal fixation hardware partially seen. No acute osseous abnormality. IMPRESSION: 1. Low lung volumes with increasing basilar hypoventilation greater  on the left. Lung base infection not excluded. Small chronic pleural effusion(s). Electronically signed by: Helayne Hurst MD 06/19/2024 09:01 AM EST RP Workstation: [id number]   DG Chest 1 View Result Date: 06/17/2024 EXAM: 1 VIEW(S) XRAY OF THE CHEST 06/17/2024 09:18:44 AM COMPARISON: 02/21/2024 cxr CLINICAL HISTORY: Cough, shortness of breath, left sided rib pain. FINDINGS: LUNGS AND PLEURA: Low lung volume. Interval development of right lower lung zone and perihilar airspace opacities. Linear scarring/atelectasis in right lower lung zone. Persistent left retrocardiac airspace opacity obscuring left medial hemidiaphragm and descending thoracic aorta, likely representing combination of left lung atelectasis and/or consolidation. Blunting of bilateral lateral CP angles. Trace bilateral pleural effusions. No pneumothorax. HEART AND MEDIASTINUM: No acute abnormality of the cardiac and mediastinal silhouettes. BONES AND SOFT TISSUES: Cervical spinal fixation hardware in place. Left shoulder arthroplasty noted. Lumbar spinal fixation hardware partially visualized. IMPRESSION: 1. Interval development of right lower lung zone and perihilar airspace opacities.Follow-up PA and lateral chest X-ray is recommended in 3-4 weeks following therapy to ensure resolution and exclude underlying malignancy. 2. Persistent left retrocardiac airspace opacity, likely representing atelectasis and/or consolidation. 3. Trace bilateral pleural effusions. Electronically signed by: Morgane Naveau MD 06/17/2024 09:24 AM EST RP Workstation: [id number]    Microbiology: Recent Results (from the past 240 hours)  Blood Culture (routine x 2)     Status: None (Preliminary result)   Collection Time: 06/19/24  9:31 AM   Specimen: BLOOD RIGHT ARM  Result Value Ref Range Status   Specimen Description BLOOD RIGHT ARM  Final   Special Requests   Final    BOTTLES DRAWN AEROBIC AND ANAEROBIC Blood Culture adequate volume   Culture   Final    NO  GROWTH 4 DAYS Performed at [facility], [address] [place], [place] [address]    Report Status PENDING  Incomplete  Blood Culture (routine x 2)     Status: None (Preliminary result)   Collection Time: 06/19/24  9:37 AM   Specimen: BLOOD RIGHT HAND  Result Value Ref Range Status   Specimen Description BLOOD RIGHT HAND  Final   Special Requests AEROBIC BOTTLE ONLY Blood Culture adequate volume  Final   Culture   Final    NO GROWTH 4 DAYS Performed at [facility], [address] [place], [place] [address]    Report Status PENDING  Incomplete  MRSA Next Gen by PCR, Nasal     Status: Abnormal   Collection Time: 06/21/24  1:50  PM   Specimen: Nasal Mucosa; Nasal Swab  Result Value Ref Range Status   MRSA by PCR Next Gen DETECTED (A) NOT DETECTED Final    Comment: RESULT CALLED TO, READ BACK BY AND VERIFIED WITH: RN P. LIPKA [phone] @ 1742 FH (NOTE) The GeneXpert MRSA Assay (FDA approved for NASAL specimens only), is one component of a comprehensive MRSA colonization surveillance program. It is not intended to diagnose MRSA infection nor to guide or monitor treatment for MRSA infections. Test performance is not FDA approved in patients less than 19 years old. Performed at [facility], [address], [place], [place] [address]      Labs: Basic Metabolic Panel: Recent Labs  Lab 06/19/24 1046 06/20/24 0225 06/21/24 0007 06/22/24 0224 06/23/24 0253  NA 140 140 138 140 140  K 3.0* 4.1 3.6 3.9 4.3  CL 105 104 100 100 103  CO2 25 27 27 30 29   GLUCOSE 79 79 94 76 90  BUN 25* 24* 23 23 23   CREATININE 0.67 0.58* 0.67 0.84 0.76  CALCIUM  8.5* 8.7* 8.5* 8.5* 8.6*  MG 1.8  --  1.9 2.4  --   PHOS 2.8  --   --   --  3.2   Liver Function Tests: Recent Labs  Lab 06/19/24 1046 06/23/24 0253  AST 32  --   ALT 33  --   ALKPHOS 111  --   BILITOT 0.4  --   PROT 5.0*  --   ALBUMIN 3.1* 3.2*   No results for input(s): LIPASE, AMYLASE in the last 168  hours. No results for input(s): AMMONIA in the last 168 hours. CBC: Recent Labs  Lab 06/19/24 0904 06/19/24 0931 06/20/24 0225  WBC  --  7.1 6.3  NEUTROABS  --  4.6  --   HGB 10.5* 9.9* 10.4*  HCT 31.0* 32.3* 34.1*  MCV  --  94.2 92.4  PLT  --  173 147*   Cardiac Enzymes: No results for input(s): CKTOTAL, CKMB, CKMBINDEX, TROPONINI in the last 168 hours. BNP: BNP (last 3 results) No results for input(s): BNP in the last 8760 hours.  ProBNP (last 3 results) Recent Labs    06/19/24 0931  PROBNP 341.0*    CBG: Recent Labs  Lab 06/21/24 0503 06/22/24 0601 06/23/24 0537  GLUCAP 86 77 72       Signed:  Sigurd Pac MD.  [facility] 06/23/2024, 10:45 AM       [1]  Allergies Allergen Reactions   Levaquin  [Levofloxacin ] Itching    Tolerated ciprofloxacin  in 2022.

## 2024-06-23 NOTE — Plan of Care (Signed)
" °  Problem: Health Behavior/Discharge Planning: Goal: Ability to manage health-related needs will improve Outcome: Progressing   Problem: Clinical Measurements: Goal: Respiratory complications will improve Outcome: Progressing Goal: Cardiovascular complication will be avoided Outcome: Progressing   Problem: Skin Integrity: Goal: Risk for impaired skin integrity will decrease Outcome: Progressing   "

## 2024-06-23 NOTE — Progress Notes (Addendum)
 Inpatient Rehab Admissions Coordinator:   I have no beds available for this patient to admit to CIR today.  Will continue to follow for timing of potential admission pending bed availability.   1540: I do have a bed available for this patient to admit to CIR today.  Dr. Fairy in agreement and [facility] aware.  I notified pt's spouse via phone.    Reche Lowers, PT, DPT Admissions Coordinator [phone] 06/23/24 9:26 AM

## 2024-06-23 NOTE — Plan of Care (Signed)

## 2024-06-23 NOTE — Progress Notes (Signed)
 Occupational Therapy Treatment Patient Details Name: Levi [name] MRN: [id number] DOB: 15-Sep-1945 Today's Date: 06/23/2024   History of present illness 79 y.o. male admitted 06/19/24 with progressive SOB, cough. Workup for acute hypoxic respiratory failure, CHF exacerbation. Chest CT with bilateral pleural effusions; new compression deformities T6-7, possibly acute. PMH includes R BKA with recent osteomyelitis, COPD, HF, chronic bronchitis, agent orange exposure, PAF, anemia, gout, depression.   OT comments  Patient received in supine and agreeable to OT treatment. Patient able to get to EOB with min assist due to assistance needed with trunk.  Patient performed lateral scoot transfer to right without sliding board and mod assist with use of bed pad.  Patient able to perform grooming and UB HEP seated in recliner with setup.  Patient will benefit from intensive inpatient follow-up therapy, >3 hours/day.  Acute OT to continue to follow to address established goals to facilitate DC to next venue of care.        If plan is discharge home, recommend the following:  Two people to help with walking and/or transfers;A lot of help with bathing/dressing/bathroom;Assistance with cooking/housework;Assist for transportation;Help with stairs or ramp for entrance   Equipment Recommendations  Other (comment) (defer)    Recommendations for Other Services      Precautions / Restrictions Precautions Precautions: Fall Recall of Precautions/Restrictions: Intact Precaution/Restrictions Comments: potential acute compression deformities T6-T7; h/o R BKA Restrictions Weight Bearing Restrictions Per Provider Order: Yes RLE Weight Bearing Per Provider Order: Non weight bearing       Mobility Bed Mobility Overal bed mobility: Needs Assistance Bed Mobility: Supine to Sit     Supine to sit: Min assist, HOB elevated, Used rails     General bed mobility comments: min assist with trunk     Transfers Overall transfer level: Needs assistance Equipment used: None Transfers: Bed to chair/wheelchair/BSC, Sit to/from Stand            Lateral/Scoot Transfers: Mod assist General transfer comment: mod assist for lateral scoot transfer to right to drop arm recliner with use of bed pad     Balance Overall balance assessment: Needs assistance, Mild deficits observed, not formally tested Sitting-balance support: No upper extremity supported, Feet supported Sitting balance-[name] Scale: Fair Sitting balance - Comments: EOB                                   ADL either performed or assessed with clinical judgement   ADL Overall ADL's : Needs assistance/impaired     Grooming: Wash/dry hands;Wash/dry face;Oral care;Set up;Sitting               Lower Body Dressing: Maximal assistance Lower Body Dressing Details (indicate cue type and reason): for left shoe                    Extremity/Trunk Assessment              Vision       Perception     Praxis     Communication Communication Communication: No apparent difficulties   Cognition Arousal: Alert Behavior During Therapy: WFL for tasks assessed/performed, Flat affect Cognition: No apparent impairments                               Following commands: Impaired Following commands impaired: Follows multi-step commands with increased time, Only follows one  step commands consistently      Cueing   Cueing Techniques: Verbal cues, Tactile cues  Exercises Exercises: General Upper Extremity General Exercises - Upper Extremity Shoulder Horizontal ABduction: Strengthening, Both, 10 reps, Seated, Theraband Theraband Level (Shoulder Horizontal Abduction): Level 2 (Red) Elbow Flexion: Strengthening, Both, 10 reps, Seated, Theraband Theraband Level (Elbow Flexion): Level 2 (Red) Elbow Extension: Strengthening, Both, 10 reps, Seated, Theraband Theraband Level (Elbow Extension):  Level 2 (Red)    Shoulder Instructions       General Comments VSS on RA    Pertinent Vitals/ Pain       Pain Assessment Pain Assessment: Faces Faces Pain Scale: Hurts a little bit Pain Location: chronic R shoulder Pain Descriptors / Indicators: Discomfort, Guarding Pain Intervention(s): Limited activity within patient's tolerance, Monitored during session  Home Living                                          Prior Functioning/Environment              Frequency  Min 2X/week        Progress Toward Goals  OT Goals(current goals can now be found in the care plan section)  Progress towards OT goals: Progressing toward goals  Acute Rehab OT Goals Patient Stated Goal: to go to rehab OT Goal Formulation: With patient Time For Goal Achievement: 07/04/24 Potential to Achieve Goals: Good ADL Goals Pt Will Perform Lower Body Dressing: sitting/lateral leans;with adaptive equipment;with supervision;with set-up Pt Will Transfer to Toilet: with min assist;with transfer board Pt/caregiver will Perform Home Exercise Program: Both right and left upper extremity;With written HEP provided;Increased strength  Plan      Co-evaluation                 AM-PAC OT 6 Clicks Daily Activity     Outcome Measure   Help from another person eating meals?: None Help from another person taking care of personal grooming?: A Little Help from another person toileting, which includes using toliet, bedpan, or urinal?: A Lot Help from another person bathing (including washing, rinsing, drying)?: A Lot Help from another person to put on and taking off regular upper body clothing?: A Little Help from another person to put on and taking off regular lower body clothing?: A Lot 6 Click Score: 16    End of Session Equipment Utilized During Treatment: Gait belt  OT Visit Diagnosis: Unsteadiness on feet (R26.81);Muscle weakness (generalized) (M62.81);Other symptoms and signs  involving cognitive function;Pain Pain - Right/Left: Left Pain - part of body: Ankle and joints of foot   Activity Tolerance Patient tolerated treatment well   Patient Left in chair;with call bell/phone within reach;with chair alarm set   Nurse Communication Mobility status;Other (comment) (lateral scoot transfers)        Time: 9091-9066 OT Time Calculation (min): 25 min  Charges: OT General Charges $OT Visit: 1 Visit OT Treatments $Self Care/Home Management : 8-22 mins $Therapeutic Exercise: 8-22 mins  Dick Laine, OTA Acute Rehabilitation Services  Office [phone]   Jeb LITTIE Laine 06/23/2024, 2:08 PM

## 2024-06-23 NOTE — H&P (Signed)
 "       Physical Medicine and Rehabilitation Admission H&P        Chief Complaint  Patient presents with   Functional decline due to debility.       HPI: Levi [name] is a 79 year old male with history of PAF, agent orange exposure, COPD/Asthma--steroid dependent, PAF, seizures, DDD s/p lumbar and cervical decompression,  hx of sacral decub, H/o MRSA bacteremia and cellulitis/abscess buttock 01/2024 (Dr. Overton), osteo myelitis s/p R-BKA and chronic BLE wounds followed by wound care, recent issues with cough secondary to RLL PNA started on doxycyline 06/17/24 at urgent care but continued to have progressive SOB with cough and hypoxia. He was admitted on 06/19/24 for work up and started on IV Rocephin /azithromycin . CTA chest negative for PE and showed small bilateral pleural effusions with bibasilar atelectasis and incidental findings of compression deformity T6 w/ 40% loss of height and T7 with 60% loss of height--possibly acute.  Symptoms felt to be due to acute exacerbation of heart failure and he was placed on lasix  for diuresis and antibiotics d/c. Echo with EF 45 to 50%, mild Mitral valve regurgitation.   Respiratory status has improved with weight down from 195 to 179 lbs.  Hypokalemia resolved with potassium supplement. PT/OT consulted and patient noted to require min to mod assist for scoot transfers and up to max assist with LB dressing. He is limited by debility and CIR recommended due to functional decline.        Review of Systems  Constitutional:  Positive for malaise/fatigue. Negative for chills and fever.  HENT:  Negative for hearing loss.   Eyes:  Positive for double vision (few weeks ago -improved). Negative for blurred vision.  Respiratory:  Positive for cough.   Cardiovascular:  Negative for chest pain and leg swelling.  Gastrointestinal:  Negative for abdominal pain, constipation, diarrhea, heartburn, nausea and vomiting.  Genitourinary:  Negative for dysuria, frequency and  urgency.  Musculoskeletal:  Positive for joint pain (frozen right shoulder) and myalgias. Negative for back pain and neck pain.  Neurological:  Positive for dizziness, sensory change, weakness and headaches.  Psychiatric/Behavioral:  The patient does not have insomnia.            Past Medical History:  Diagnosis Date   Agent orange exposure 1970's    takes Imdur ,Diltiazem , and Atacand  daily   Agent orange exposure     ALLERGIC RHINITIS     Allergy       seasonal allergies   Anemia      iron  deficiency   Arthritis     Asthma      uses inhaler and nebulizers   Atrial fibrillation (HCC)     Bruises easily      d/t prednisone  daily   Cataract     Cellulitis 03/22/2020   Chest wall pain 09/21/2019    L scapula, reported 4/27 21/     CHF (congestive heart failure) (HCC)      dx-on meds to treat   Chronic bronchitis (HCC)      used to get it q yr; last time ?2008 (04/28/2012)   Clotting disorder 2019    On blood thinner when I bleed it is for a consideral amount of time. This is tegardless of size of wound.   COPD (chronic obstructive pulmonary disease) (HCC)      agent orange exposure   Degenerative disk disease      everywhere (04/28/2012)   Depression due to physical illness 12/15/2023  Diverticulitis     Dysrhythmia      afib   Elevated uric acid in blood      takes Allopurinol  daily   Emphysema of lung (HCC)     Enlarged prostate      but not on any meds   Essential (primary) hypertension 11/15/2020   GERD (gastroesophageal reflux disease)      takes Protonix  daily   H/O hiatal hernia     Headache     History of colonic polyps 02/09/2013    01/2013 - 3 adenomas max 8 mm  01/2015 2 diminutive adenomas - repeat colon 2021     History of colonoscopy     History of pneumonia      a. 2010   History of total right hip arthroplasty 07/31/2015   Hyperlipidemia      takes Lipitor daily; pt. states he takes a preventive   Hypothyroidism     Joint pain     Joint  swelling     Leukocytosis     Lumbar stenosis status post T12-S1 fusion 04/07/2017   MSSA bacteremia 03/19/2023   Nausea and vomiting 11/12/2022   Neuromuscular disorder (HCC)      bilat legs, and bilat arms   Neuropathy     Osteoarthritis of left knee 01/10/2012   Osteoporosis 2012   PAF (paroxysmal atrial fibrillation) (HCC)      a. Dx 04/2012, CHA2DS2VASc = 1 (age);  b. 04/2012 Echo: EF 40-50%, mild MR.   Personal history of colonic adenomas 02/09/2013   Pneumonia     Primary osteoarthritis of right knee 06/17/2014   Seizures (HCC) 06/20/2024   Sepsis (HCC) 03/18/2023   Shortness of breath dyspnea     Thyroid  disease      no taking meds-caused dizziness               Past Surgical History:  Procedure Laterality Date   26 HOUR PH STUDY N/A 05/30/2021    Procedure: 24 HOUR PH STUDY;  Surgeon: San Sandor GAILS, DO;  Location: [facility];  Service: Gastroenterology;  Laterality: N/A;   AMPUTATION Right 11/14/2023    Procedure: AMPUTATION BELOW KNEE;  Surgeon: Harden Jerona GAILS, MD;  Location: [facility] OR;  Service: Orthopedics;  Laterality: Right;   AMPUTATION TOE Bilateral 08/13/2023    Procedure: LEFT SECOND TOE AMPUTATION AND RIGHT FIFTH TOE AMPUTATION;  Surgeon: Malvin Marsa FALCON, DPM;  Location: [facility];  Service: Orthopedics/Podiatry;  Laterality: Bilateral;   ANTERIOR CERVICAL DECOMP/DISCECTOMY FUSION N/A 07/21/2012    Procedure: ANTERIOR CERVICAL DECOMPRESSION/DISCECTOMY FUSION 2 LEVELS;  Surgeon: Victory Gens, MD;  Location: [facility];  Service: Neurosurgery;  Laterality: N/A;  C4-5 C5-6 Anterior cervical decompression/diskectomy/fusion   ANTERIOR LAT LUMBAR FUSION N/A 04/07/2017    Procedure: Thoracic twelve-Lumbar one Anterolateral decompression/fusion;  Surgeon: Gens Victory, MD;  Location: [facility];  Service: Neurosurgery;  Laterality: N/A;   ARTHRODESIS FOOT WITH WEIL OSTEOTOMY Left 11/07/2022    Procedure: LEFT THIRD METATARSOPHALANGEAL CAPSULOTOMY, WEIL OSTEOTOMY;   Surgeon: Elsa Lonni SAUNDERS, MD;  Location: [facility] OR;  Service: Orthopedics;  Laterality: Left;  LENGTH OF SURGERY: 60 MINUTES   BACK SURGERY        x 3   BELOW KNEE LEG AMPUTATION Right      June 2025   BONE EXOSTOSIS EXCISION Right 10/16/2023    Procedure: EXCISION, EXOSTOSIS;  Surgeon: Janit Thresa HERO, DPM;  Location: [facility];  Service: Orthopedics/Podiatry;  Laterality: Right;   CARDIAC CATHETERIZATION   11/2008  Dr. Jeffrie - 20% calcified non flow limiting left main, 50% EF apical hypokinesis   CARPAL TUNNEL RELEASE Bilateral     CERVICAL DISC ARTHROPLASTY N/A 04/07/2017    Procedure: Cervical six-seven Disc arthroplasty;  Surgeon: Colon Shove, MD;  Location: [facility] OR;  Service: Neurosurgery;  Laterality: N/A;   CHEST TUBE INSERTION Left 04/11/2017    Procedure: CHEST TUBE INSERTION;  Surgeon: Fleeta Hanford Coy, MD;  Location: [facility] OR;  Service: Thoracic;  Laterality: Left;   CHOLECYSTECTOMY       COLONOSCOPY   2016    CG-MAC-miralax (good)servere TICS/TA x 2   ESOPHAGEAL MANOMETRY N/A 05/30/2021    Procedure: ESOPHAGEAL MANOMETRY (EM);  Surgeon: San Sandor GAILS, DO;  Location: [facility];  Service: Gastroenterology;  Laterality: N/A;  ph impedence   ESOPHAGOGASTRODUODENOSCOPY       EYE SURGERY Bilateral 2011    steroidal encapsulation (04/28/2012)   FOOT SURGERY Right      x 2   JOINT REPLACEMENT        Many surgeries including 5 joit replacements   KNEE ARTHROSCOPY Bilateral     LATERAL / POSTERIOR COMBINED FUSION LUMBAR SPINE   2012   LEFT ATRIAL APPENDAGE OCCLUSION N/A 02/22/2021    Procedure: LEFT ATRIAL APPENDAGE OCCLUSION;  Surgeon: Cindie Ole DASEN, MD;  [facility];  Service: Cardiovascular;  Laterality: N/A;   NEUROPLASTY / TRANSPOSITION ULNAR NERVE AT ELBOW Bilateral     POLYPECTOMY   2016    severe TICS/TA x 2   POSTERIOR CERVICAL LAMINECTOMY WITH MET- RX Right 06/11/2021    Procedure: Right Cervical six-seven  Laminectomy and foraminotomy with  metrex;  Surgeon: Colon Shove, MD;  Location: [facility];  Service: Neurosurgery;  Laterality: Right;   REFRACTIVE SURGERY Left 07/07/2023    laser surgery but still has floaters   REVERSE SHOULDER ARTHROPLASTY   04/28/2012    Procedure: REVERSE SHOULDER ARTHROPLASTY;  Surgeon: Eva Elsie Herring, MD;  Location: [facility] OR;  Service: Orthopedics;  Laterality: Left;  Left shouder reverse total shoulder arthroplasty   SHOULDER SURGERY       SPINE SURGERY        I have had 7 spinal surgeries   TEE WITHOUT CARDIOVERSION N/A 02/22/2021    Procedure: TRANSESOPHAGEAL ECHOCARDIOGRAM (TEE);  Surgeon: Cindie Ole DASEN, MD;  Location: [facility] INVASIVE CV LAB;  Service: Cardiovascular;  Laterality: N/A;   TENDON REPAIR Right 08/07/2020    Procedure: RIGHT HAND LIGAMENT RECONSTRUCTION AND TENDON INTERPOSITION;  Surgeon: Yvone Rush, MD;  Location: [facility];  Service: Orthopedics;  Laterality: Right;   TONSILLECTOMY AND ADENOIDECTOMY   1954   TOTAL HIP ARTHROPLASTY Left 2011    left (04/28/2012)   TOTAL HIP ARTHROPLASTY Right 07/31/2015    Procedure: TOTAL HIP ARTHROPLASTY ANTERIOR APPROACH;  Surgeon: Rush Yvone, MD;  Location[facility];  Service: Orthopedics;  Laterality: Right;   TOTAL KNEE ARTHROPLASTY   01/10/2012    Procedure: TOTAL KNEE ARTHROPLASTY;  Surgeon: Rush LITTIE Yvone, MD;  Location: [facility];  Service: Orthopedics;;  left total knee arthroplasty   TOTAL KNEE ARTHROPLASTY Right 06/17/2014    Procedure: TOTAL KNEE ARTHROPLASTY;  Surgeon: Rush LITTIE Yvone, MD;  Location: [facility];  Service: Orthopedics;  Laterality: Right;   toupet fundolplication   11/01/2021   TRANSESOPHAGEAL ECHOCARDIOGRAM (CATH LAB) N/A 03/24/2023    Procedure: TRANSESOPHAGEAL ECHOCARDIOGRAM;  Surgeon: Mona Vinie BROCKS, MD;  [facility]: [facility];  Service: Cardiovascular;  Laterality: N/A;   TRANSESOPHAGEAL ECHOCARDIOGRAM (  CATH LAB) N/A 02/25/2024    Procedure: TRANSESOPHAGEAL ECHOCARDIOGRAM;  Surgeon: Jeffrie Oneil BROCKS, MD;  Location: [facility];  Service: Cardiovascular;  Laterality: N/A;               Family History  Problem Relation Age of Onset   Diabetes Mother     Heart disease Father          Died 16, MI   Cancer Father     Hyperlipidemia Father     Stroke Father     Prostate cancer Brother     Colon polyps Brother     Diabetes Brother     Colon polyps Brother     Asthma Brother     COPD Brother     Colon cancer Brother          Dx age 48   Cancer Brother     Colon cancer Other     Esophageal cancer Neg Hx     Rectal cancer Neg Hx     Stomach cancer Neg Hx     Pancreatic cancer Neg Hx     Kidney disease Neg Hx     Liver disease Neg Hx     Neuropathy Neg Hx            Social History:  reports that he has never smoked. He has been exposed to tobacco smoke. He has never used smokeless tobacco. He reports that he does not drink alcohol and does not use drugs.     Allergies: [Allergies]  [Allergies]      Allergen Reactions   Levaquin  [Levofloxacin ] Itching      Tolerated ciprofloxacin  in 2022.         Medications Prior to Admission  Medication Sig Dispense Refill   Cholecalciferol (VITAMIN D3) 1.25 MG (50000 UT) CAPS Take 1 capsule by mouth once a week.       acetaminophen  (TYLENOL ) 325 MG tablet Take 1-2 tablets (325-650 mg total) by mouth every 4 (four) hours as needed for mild pain (pain score 1-3). (Patient not taking: No sig reported)       albuterol  (VENTOLIN  HFA) 108 (90 Base) MCG/ACT inhaler INHALE 2 PUFFS BY MOUTH EVERY 4 HOURS AS NEEDED FOR WHEEZE OR FOR SHORTNESS OF BREATH 8 each 4   allopurinol  (ZYLOPRIM ) 100 MG tablet Take 100 mg by mouth daily. (Patient not taking: No sig reported)       apixaban  (ELIQUIS ) 5 MG TABS tablet Take 1 tablet (5 mg total) by mouth 2 (two) times daily. 180 tablet 1   atorvastatin  (LIPITOR) 20 MG tablet Take 1 tablet (20 mg total) by mouth daily. 90 tablet 3   benzonatate  (TESSALON ) 100 MG capsule Take 1 capsule (100 mg  total) by mouth every 8 (eight) hours. 21 capsule 0   cyanocobalamin  (VITAMIN B12) 1000 MCG/ML injection Inject 1,000 mcg into the skin every 30 (thirty) days.       cyclobenzaprine  (FLEXERIL ) 10 MG tablet TAKE 1 TABLET BY MOUTH THREE TIMES A DAY AS NEEDED FOR MUSCLE SPASMS 90 tablet 3   diltiazem  (CARDIZEM  CD) 180 MG 24 hr capsule Take 1 capsule (180 mg total) by mouth daily. 90 capsule 3   doxycycline  (VIBRAMYCIN ) 100 MG capsule Take 1 capsule (100 mg total) by mouth 2 (two) times daily. 20 capsule 0   DULoxetine  (CYMBALTA ) 30 MG capsule Take 30 mg by mouth 2 (two) times daily.       escitalopram  (LEXAPRO ) 10 MG tablet  Take 1 tablet (10 mg total) by mouth daily. 90 tablet 1   ferrous gluconate  (FERGON) 324 MG tablet Take 1 tablet (324 mg total) by mouth daily. 30 tablet 0   ipratropium-albuterol  (DUONEB) 0.5-2.5 (3) MG/3ML SOLN 1 NEBULE EVERY 6 HOURS AS NEEDED. **J45.3** 270 mL 4   isosorbide  mononitrate (IMDUR ) 30 MG 24 hr tablet Take 30 mg by mouth daily.       lamoTRIgine  (LAMICTAL ) 25 MG tablet Take by mouth.       nutrition supplement, JUVEN, (JUVEN) PACK Take 1 packet by mouth 2 (two) times daily between meals.       Olodaterol HCl 2.5 MCG/ACT AERS Inhale 2 Inhalations into the lungs daily. Brand name Striverdi       oxyCODONE  (OXY IR/ROXICODONE ) 5 MG immediate release tablet Take 1 tablet (5 mg total) by mouth every 4 (four) hours as needed for severe pain (pain score 7-10) or moderate pain (pain score 4-6). 30 tablet 0   pantoprazole  (PROTONIX ) 40 MG tablet Take 1 tablet (40 mg total) by mouth 2 (two) times daily before a meal. 60 tablet 0   predniSONE  (DELTASONE ) 5 MG tablet Take 4 tablets (20 mg total) by mouth daily with breakfast. 3 tabs daily       pregabalin  (LYRICA ) 150 MG capsule Take 1 capsule (150 mg total) by mouth 2 (two) times daily. (Patient not taking: No sig reported) 60 capsule 2   pregabalin  (LYRICA ) 75 MG capsule Take 1 capsule (75 mg total) by mouth daily. 90 capsule 1    sulfamethoxazole -trimethoprim  (BACTRIM ) 400-80 MG tablet Take 1 tablet by mouth 2 (two) times daily. 60 tablet 5   tamsulosin  (FLOMAX ) 0.4 MG CAPS capsule Take 1 capsule (0.4 mg total) by mouth at bedtime. 30 capsule 0   Tiotropium Bromide-Olodaterol (STIOLTO RESPIMAT ) 2.5-2.5 MCG/ACT AERS Inhale 2 puffs into the lungs daily. 4 g 5   [DISCONTINUED] predniSONE  (DELTASONE ) 10 MG tablet Take 1 tablet (10 mg total) by mouth daily with breakfast. 90 tablet 2              Home: Home Living Family/patient expects to be discharged to:: Private residence Living Arrangements: Spouse/significant other Available Help at Discharge: Family, Available 24 hours/day Type of Home: House Home Access: Ramped entrance Home Layout: Able to live on main level with bedroom/bathroom Bathroom Shower/Tub: Health Visitor: Handicapped height Bathroom Accessibility: Yes Home Equipment: Information systems manager, BSC/3in1, Agricultural Consultant (2 wheels), Cane - single point, Rollator (4 wheels), Wheelchair - manual, Toilet riser, Tub bench (drop arm BSC, slideboard, sock aid) Additional Comments: has not been using prosthetic   Functional History: Prior Function Prior Level of Function : Needs assist Mobility Comments: most recently using a slide board. Has a R LE prosthetic, but has been unable to use due to wounds/infections ADLs Comments: wife assists PRN for ADLs; has aide 2x per week per report but unsure accuracy   Functional Status:  Mobility: Bed Mobility Overal bed mobility: Needs Assistance Bed Mobility: Supine to Sit Supine to sit: Min assist, HOB elevated, Used rails General bed mobility comments: min assist with trunk Transfers Overall transfer level: Needs assistance Equipment used: None Transfers: Bed to chair/wheelchair/BSC, Sit to/from Stand Bed to/from chair/wheelchair/BSC transfer type:: Lateral/scoot transfer Squat pivot transfers: Max assist, +2 physical assistance, +2  safety/equipment  Lateral/Scoot Transfers: Mod assist General transfer comment: mod assist for lateral scoot transfer to right to drop arm recliner with use of bed pad Ambulation/Gait General Gait Details: unable at baseline  ADL: ADL Overall ADL's : Needs assistance/impaired Eating/Feeding: Set up, Sitting Grooming: Wash/dry hands, Wash/dry face, Oral care, Set up, Sitting Upper Body Bathing: Set up, Sitting Lower Body Bathing: Moderate assistance Upper Body Dressing : Set up, Sitting Lower Body Dressing: Maximal assistance Lower Body Dressing Details (indicate cue type and reason): for left shoe Toilet Transfer: Maximal assistance, +2 for physical assistance, +2 for safety/equipment, Squat-pivot Functional mobility during ADLs: Maximal assistance, +2 for physical assistance, +2 for safety/equipment   Cognition: Cognition Orientation Level: Oriented X4 Cognition Arousal: Alert Behavior During Therapy: WFL for tasks assessed/performed, Flat affect   Physical Exam: Blood pressure 137/76, pulse 80, temperature 97.8 F (36.6 C), temperature source Oral, resp. rate 20, height 6' 1 (1.854 m), weight 81.5 kg, SpO2 96%.     General: No apparent distress, Frail, chronically ill appearing male. Raspy voice.   HEENT: Head is normocephalic, atraumatic, sclera anicteric, oral mucosa dry Neck: Supple without JVD or lymphadenopathy Heart: Reg rate and rhythm. No murmurs rubs or gallops Chest: mild raltes at bases, non-labored Abdomen: Soft, non-tender, non-distended, bowel sounds positive. Extremities: R BKA, wound over fib head- please see image, 1+ LLE edema  Psych: Pt's affect is appropriate. Pt is cooperative. Very pleasant Skin:  Right BKA with evidence of two small healed abrasions. Tibial tuberosity with pressure areas, petechiae right inner thigh. Open area (appears to have 1 cm depth) right lateral BKA site around fibular head. Dry flaky paper thin skin with multiple ecchymotic  areas and healing skin tears on four limbs. Hematomas on left shin anterior and posteriorly. Buttocks red with small open area on top of gluteal cleft.   Neuro:     Mental Status: AAOx4 Speech/Languate: Naming and repetition intact, fluent, follows simple commands CRANIAL NERVES: II: PERRL. Visual fields full III, IV, VI: EOM intact, no gaze preference or deviation V: normal sensation bilaterally VII: no asymmetry VIII: normal hearing to speech IX, X: normal palatal elevation XI: head turn intact XII: Tongue midline     MOTOR: RUE: 1-2/5 Deltoid, 4/5 Biceps, 4/5 Triceps,4/5 Grip LUE: 4/5 Deltoid, 4/5 Biceps, 4/5 Triceps, 4/5 Grip RLE: HF 4/5, KE 4/5 LLE: HF 4/5, KE 4/5, ADF 2-3/5, APF 3/5   SENSORY: Altered to LT in b/l UE and LLE   Coordination: Normal finger to nose intact LUE, limited on R due to shoulder weakness   MSK: decreased PROM R shoulder- chronic                 Lab Results Last 48 Hours        Results for orders placed or performed during the hospital encounter of 06/19/24 (from the past 48 hours)  Basic metabolic panel with GFR     Status: Abnormal    Collection Time: 06/22/24  2:24 AM  Result Value Ref Range    Sodium 140 135 - 145 mmol/L    Potassium 3.9 3.5 - 5.1 mmol/L    Chloride 100 98 - 111 mmol/L    CO2 30 22 - 32 mmol/L    Glucose, Bld 76 70 - 99 mg/dL      Comment: Glucose reference range applies only to samples taken after fasting for at least 8 hours.    BUN 23 8 - 23 mg/dL    Creatinine, Ser 9.15 0.61 - 1.24 mg/dL    Calcium  8.5 (L) 8.9 - 10.3 mg/dL    GFR, Estimated >39 >39 mL/min      Comment: (NOTE) Calculated using the CKD-EPI Creatinine Equation (  2021)      Anion gap 10 5 - 15      Comment: Performed at [facility], [address], [place], [place] [address]  Magnesium      Status: None    Collection Time: 06/22/24  2:24 AM  Result Value Ref Range    Magnesium  2.4 1.7 - 2.4 mg/dL      Comment: Performed at [facility], [address], [place], [place] [address]  Glucose, capillary     Status: None    Collection Time: 06/22/24  6:01 AM  Result Value Ref Range    Glucose-Capillary 77 70 - 99 mg/dL      Comment: Glucose reference range applies only to samples taken after fasting for at least 8 hours.  Renal function panel     Status: Abnormal    Collection Time: 06/23/24  2:53 AM  Result Value Ref Range    Sodium 140 135 - 145 mmol/L    Potassium 4.3 3.5 - 5.1 mmol/L    Chloride 103 98 - 111 mmol/L    CO2 29 22 - 32 mmol/L    Glucose, Bld 90 70 - 99 mg/dL      Comment: Glucose reference range applies only to samples taken after fasting for at least 8 hours.    BUN 23 8 - 23 mg/dL    Creatinine, Ser 9.23 0.61 - 1.24 mg/dL    Calcium  8.6 (L) 8.9 - 10.3 mg/dL    Phosphorus 3.2 2.5 - 4.6 mg/dL    Albumin 3.2 (L) 3.5 - 5.0 g/dL    GFR, Estimated >39 >39 mL/min      Comment: (NOTE) Calculated using the CKD-EPI Creatinine Equation (2021)      Anion gap 8 5 - 15      Comment: Performed at [facility], [address] [place], [place] [address]  Glucose, capillary     Status: None    Collection Time: 06/23/24  5:37 AM  Result Value Ref Range    Glucose-Capillary 72 70 - 99 mg/dL      Comment: Glucose reference range applies only to samples taken after fasting for at least 8 hours.    *Note: Due to a large number of results and/or encounters for the requested time period, some results have not been displayed. A complete set of results can be found in Results Review.      Imaging Results (Last 48 hours)  No results found.         Blood pressure 137/76, pulse 80, temperature 97.8 F (36.6 C), temperature source Oral, resp. rate 20, height 6' 1 (1.854 m), weight 81.5 kg, SpO2 96%.   Medical Problem List and Plan: 1. Functional deficits secondary to Debility due to CHF with hx of R BKA             -patient may shower             -ELOS/Goals: 10-14 days, min A PT/OT             -Admit  to CIR 2.  Antithrombotics: -DVT/anticoagulation:  Pharmaceutical: Eliquis              -antiplatelet therapy: N/A 3. Pain Management: Tylenol  prn for mild and oxycodone  prn for severe pain.  Chronic R shoulder injury.  4. Mood/Behavior/Sleep: LCSW to follow for evaluation and support.              -antipsychotic agents: N/A 5. Neuropsych/cognition:  This patient is capable of making decisions on his own behalf. 6. Skin/Wound Care: Routine pressure relief 7. Fluids/Electrolytes/Nutrition: Monitor I/O. Check CMET in am.             --[employer] added for  nutritional supplement/wound healing.  8. Acute on chronic diastolic CHF: Strict I/O with daily weights.              --on Losartan , Spironolactone , lasix  and Lipitor.   9. Hypokalemia: Monitor for recurrence with Lasix  on board. K+ 4.3 on 1/28 10.  PAF: Monitor HR TID--on diltiazem  and Apixaban . 11.  Asthma/COPD: Continue chronic steroids--prednisone  15 mg w/  with MDI. 12. GERD: Managed on Protonix .  13.  Hx of Seizures: Controlled on Lamotrigine  14. BPH: Continue Flomax . Continent. 15. Neuropathy: On Lyrica  and cymbalta .  16. Thrombocytopenia: Boderline low and likely reactive. Recheck platelets in am.  --Monitor for signs of bleeding 17.Buttock pain: Reports severe with certain movements. EPC cream with foam dressing.  --Hx of sacral decub--Buttocks red with small open area on top of gluteal cleft --Will order air mattress for pressure relief and to prevent breakdown.  18. Anemia of chronic disease?: Will order anemia panel.  19. HLD: continue statin       Pamela S Love, PA-C 06/23/2024   I have personally performed a face to face diagnostic evaluation of this patient and formulated the key components of the plan.  Additionally, I have personally reviewed laboratory data, imaging studies, as well as relevant notes and concur with the physician assistant's documentation above.   The patient's status has not changed from  the original H&P.  Any changes in documentation from the acute care chart have been noted above.   Murray Collier, MD   "

## 2024-06-23 NOTE — H&P (Signed)
 "   Physical Medicine and Rehabilitation Admission H&P    Chief Complaint  Patient presents with   Functional decline due to debility.     HPI: Levi [name] is a 79 year old male with history of PAF, agent orange exposure, COPD/Asthma--steroid dependent, PAF, seizures, DDD s/p lumbar and cervical decompression,  hx of sacral decub, H/o MRSA bacteremia and cellulitis/abscess buttock 01/2024 (Dr. Overton), osteo myelitis s/p R-BKA and chronic BLE wounds followed by wound care, recent issues with cough secondary to RLL PNA started on doxycyline 06/17/24 at urgent care but continued to have progressive SOB with cough and hypoxia. He was admitted on 06/19/24 for work up and started on IV Rocephin /azithromycin . CTA chest negative for PE and showed small bilateral pleural effusions with bibasilar atelectasis and incidental findings of compression deformity T6 w/ 40% loss of height and T7 with 60% loss of height--possibly acute.  Symptoms felt to be due to acute exacerbation of heart failure and he was placed on lasix  for diuresis and antibiotics d/c. Echo with EF 45 to 50%, mild Mitral valve regurgitation.   Respiratory status has improved with weight down from 195 to 179 lbs.  Hypokalemia resolved with potassium supplement. PT/OT consulted and patient noted to require min to mod assist for scoot transfers and up to max assist with LB dressing. He is limited by debility and CIR recommended due to functional decline.     Review of Systems  Constitutional:  Positive for malaise/fatigue. Negative for chills and fever.  HENT:  Negative for hearing loss.   Eyes:  Positive for double vision (few weeks ago -improved). Negative for blurred vision.  Respiratory:  Positive for cough.   Cardiovascular:  Negative for chest pain and leg swelling.  Gastrointestinal:  Negative for abdominal pain, constipation, diarrhea, heartburn, nausea and vomiting.  Genitourinary:  Negative for dysuria, frequency and urgency.   Musculoskeletal:  Positive for joint pain (frozen right shoulder) and myalgias. Negative for back pain and neck pain.  Neurological:  Positive for dizziness, sensory change, weakness and headaches.  Psychiatric/Behavioral:  The patient does not have insomnia.      Past Medical History:  Diagnosis Date   Agent orange exposure 1970's   takes Imdur ,Diltiazem , and Atacand  daily   Agent orange exposure    ALLERGIC RHINITIS    Allergy     seasonal allergies   Anemia    iron  deficiency   Arthritis    Asthma    uses inhaler and nebulizers   Atrial fibrillation (HCC)    Bruises easily    d/t prednisone  daily   Cataract    Cellulitis 03/22/2020   Chest wall pain 09/21/2019   L scapula, reported 4/27 21/     CHF (congestive heart failure) (HCC)    dx-on meds to treat   Chronic bronchitis (HCC)    used to get it q yr; last time ?2008 (04/28/2012)   Clotting disorder 2019   On blood thinner when I bleed it is for a consideral amount of time. This is tegardless of size of wound.   COPD (chronic obstructive pulmonary disease) (HCC)    agent orange exposure   Degenerative disk disease    everywhere (04/28/2012)   Depression due to physical illness 12/15/2023   Diverticulitis    Dysrhythmia    afib   Elevated uric acid in blood    takes Allopurinol  daily   Emphysema of lung (HCC)    Enlarged prostate    but not on any meds  Essential (primary) hypertension 11/15/2020   GERD (gastroesophageal reflux disease)    takes Protonix  daily   H/O hiatal hernia    Headache    History of colonic polyps 02/09/2013   01/2013 - 3 adenomas max 8 mm  01/2015 2 diminutive adenomas - repeat colon 2021     History of colonoscopy    History of pneumonia    a. 2010   History of total right hip arthroplasty 07/31/2015   Hyperlipidemia    takes Lipitor daily; pt. states he takes a preventive   Hypothyroidism    Joint pain    Joint swelling    Leukocytosis    Lumbar stenosis status post T12-S1  fusion 04/07/2017   MSSA bacteremia 03/19/2023   Nausea and vomiting 11/12/2022   Neuromuscular disorder (HCC)    bilat legs, and bilat arms   Neuropathy    Osteoarthritis of left knee 01/10/2012   Osteoporosis 2012   PAF (paroxysmal atrial fibrillation) (HCC)    a. Dx 04/2012, CHA2DS2VASc = 1 (age);  b. 04/2012 Echo: EF 40-50%, mild MR.   Personal history of colonic adenomas 02/09/2013   Pneumonia    Primary osteoarthritis of right knee 06/17/2014   Seizures (HCC) 06/20/2024   Sepsis (HCC) 03/18/2023   Shortness of breath dyspnea    Thyroid  disease    no taking meds-caused dizziness    Past Surgical History:  Procedure Laterality Date   28 HOUR PH STUDY N/A 05/30/2021   Procedure: 24 HOUR PH STUDY;  Surgeon: San Sandor GAILS, DO;  Location: [facility];  Service: Gastroenterology;  Laterality: N/A;   AMPUTATION Right 11/14/2023   Procedure: AMPUTATION BELOW KNEE;  Surgeon: Harden Jerona GAILS, MD;  Location: [facility] OR;  Service: Orthopedics;  Laterality: Right;   AMPUTATION TOE Bilateral 08/13/2023   Procedure: LEFT SECOND TOE AMPUTATION AND RIGHT FIFTH TOE AMPUTATION;  Surgeon: Malvin Marsa FALCON, DPM;  Location: [facility];  Service: Orthopedics/Podiatry;  Laterality: Bilateral;   ANTERIOR CERVICAL DECOMP/DISCECTOMY FUSION N/A 07/21/2012   Procedure: ANTERIOR CERVICAL DECOMPRESSION/DISCECTOMY FUSION 2 LEVELS;  Surgeon: Victory Gens, MD;  Location: [facility];  Service: Neurosurgery;  Laterality: N/A;  C4-5 C5-6 Anterior cervical decompression/diskectomy/fusion   ANTERIOR LAT LUMBAR FUSION N/A 04/07/2017   Procedure: Thoracic twelve-Lumbar one Anterolateral decompression/fusion;  Surgeon: Gens Victory, MD;  Location: [facility];  Service: Neurosurgery;  Laterality: N/A;   ARTHRODESIS FOOT WITH WEIL OSTEOTOMY Left 11/07/2022   Procedure: LEFT THIRD METATARSOPHALANGEAL CAPSULOTOMY, WEIL OSTEOTOMY;  Surgeon: Elsa Lonni SAUNDERS, MD;  Location: [facility] OR;  Service: Orthopedics;  Laterality: Left;   LENGTH OF SURGERY: 60 MINUTES   BACK SURGERY     x 3   BELOW KNEE LEG AMPUTATION Right    June 2025   BONE EXOSTOSIS EXCISION Right 10/16/2023   Procedure: EXCISION, EXOSTOSIS;  Surgeon: Janit Thresa HERO, DPM;  Location: [facility];  Service: Orthopedics/Podiatry;  Laterality: Right;   CARDIAC CATHETERIZATION  11/2008   Dr. Jeffrie - 20% calcified non flow limiting left main, 50% EF apical hypokinesis   CARPAL TUNNEL RELEASE Bilateral    CERVICAL DISC ARTHROPLASTY N/A 04/07/2017   Procedure: Cervical six-seven Disc arthroplasty;  Surgeon: Gens Victory, MD;  Location: [facility] OR;  Service: Neurosurgery;  Laterality: N/A;   CHEST TUBE INSERTION Left 04/11/2017   Procedure: CHEST TUBE INSERTION;  Surgeon: Fleeta Hanford Coy, MD;  Location: [facility] OR;  Service: Thoracic;  Laterality: Left;   CHOLECYSTECTOMY     COLONOSCOPY  2016   CG-MAC-miralax (good)servere TICS/TA x 2  ESOPHAGEAL MANOMETRY N/A 05/30/2021   Procedure: ESOPHAGEAL MANOMETRY (EM);  Surgeon: San Sandor GAILS, DO;  Location: [facility];  Service: Gastroenterology;  Laterality: N/A;  ph impedence   ESOPHAGOGASTRODUODENOSCOPY     EYE SURGERY Bilateral 2011   steroidal encapsulation (04/28/2012)   FOOT SURGERY Right    x 2   JOINT REPLACEMENT     Many surgeries including 5 joit replacements   KNEE ARTHROSCOPY Bilateral    LATERAL / POSTERIOR COMBINED FUSION LUMBAR SPINE  2012   LEFT ATRIAL APPENDAGE OCCLUSION N/A 02/22/2021   Procedure: LEFT ATRIAL APPENDAGE OCCLUSION;  Surgeon: Cindie Ole DASEN, MD;  [facility];  Service: Cardiovascular;  Laterality: N/A;   NEUROPLASTY / TRANSPOSITION ULNAR NERVE AT ELBOW Bilateral    POLYPECTOMY  2016   severe TICS/TA x 2   POSTERIOR CERVICAL LAMINECTOMY WITH MET- RX Right 06/11/2021   Procedure: Right Cervical six-seven  Laminectomy and foraminotomy with metrex;  Surgeon: Colon Shove, MD;  Location: [facility];  Service: Neurosurgery;  Laterality: Right;   REFRACTIVE SURGERY Left  07/07/2023   laser surgery but still has floaters   REVERSE SHOULDER ARTHROPLASTY  04/28/2012   Procedure: REVERSE SHOULDER ARTHROPLASTY;  Surgeon: Eva Elsie Herring, MD;  Location: [facility] OR;  Service: Orthopedics;  Laterality: Left;  Left shouder reverse total shoulder arthroplasty   SHOULDER SURGERY     SPINE SURGERY     I have had 7 spinal surgeries   TEE WITHOUT CARDIOVERSION N/A 02/22/2021   Procedure: TRANSESOPHAGEAL ECHOCARDIOGRAM (TEE);  Surgeon: Cindie Ole DASEN, MD;  Location: [facility] INVASIVE CV LAB;  Service: Cardiovascular;  Laterality: N/A;   TENDON REPAIR Right 08/07/2020   Procedure: RIGHT HAND LIGAMENT RECONSTRUCTION AND TENDON INTERPOSITION;  Surgeon: Yvone Rush, MD;  Location: [facility];  Service: Orthopedics;  Laterality: Right;   TONSILLECTOMY AND ADENOIDECTOMY  1954   TOTAL HIP ARTHROPLASTY Left 2011   left (04/28/2012)   TOTAL HIP ARTHROPLASTY Right 07/31/2015   Procedure: TOTAL HIP ARTHROPLASTY ANTERIOR APPROACH;  Surgeon: Rush Yvone, MD;  Location: [facility];  Service: Orthopedics;  Laterality: Right;   TOTAL KNEE ARTHROPLASTY  01/10/2012   Procedure: TOTAL KNEE ARTHROPLASTY;  Surgeon: Rush LITTIE Yvone, MD;  Location: [facility];  Service: Orthopedics;;  left total knee arthroplasty   TOTAL KNEE ARTHROPLASTY Right 06/17/2014   Procedure: TOTAL KNEE ARTHROPLASTY;  Surgeon: Rush LITTIE Yvone, MD;  Location: [facility];  Service: Orthopedics;  Laterality: Right;   toupet fundolplication  11/01/2021   TRANSESOPHAGEAL ECHOCARDIOGRAM (CATH LAB) N/A 03/24/2023   Procedure: TRANSESOPHAGEAL ECHOCARDIOGRAM;  Surgeon: Mona Vinie BROCKS, MD;  [facility];  Service: Cardiovascular;  Laterality: N/A;   TRANSESOPHAGEAL ECHOCARDIOGRAM (CATH LAB) N/A 02/25/2024   Procedure: TRANSESOPHAGEAL ECHOCARDIOGRAM;  Surgeon: Jeffrie Oneil BROCKS, MD;  Location: [facility];  Service: Cardiovascular;  Laterality: N/A;    Family History  Problem Relation Age of Onset    Diabetes Mother    Heart disease Father        Died 72, MI   Cancer Father    Hyperlipidemia Father    Stroke Father    Prostate cancer Brother    Colon polyps Brother    Diabetes Brother    Colon polyps Brother    Asthma Brother    COPD Brother    Colon cancer Brother        Dx age 2   Cancer Brother    Colon cancer Other    Esophageal cancer Neg  Hx    Rectal cancer Neg Hx    Stomach cancer Neg Hx    Pancreatic cancer Neg Hx    Kidney disease Neg Hx    Liver disease Neg Hx    Neuropathy Neg Hx     Social History:  reports that he has never smoked. He has been exposed to tobacco smoke. He has never used smokeless tobacco. He reports that he does not drink alcohol and does not use drugs.   Allergies: Allergies[1] Medications Prior to Admission  Medication Sig Dispense Refill   Cholecalciferol (VITAMIN D3) 1.25 MG (50000 UT) CAPS Take 1 capsule by mouth once a week.     acetaminophen  (TYLENOL ) 325 MG tablet Take 1-2 tablets (325-650 mg total) by mouth every 4 (four) hours as needed for mild pain (pain score 1-3). (Patient not taking: No sig reported)     albuterol  (VENTOLIN  HFA) 108 (90 Base) MCG/ACT inhaler INHALE 2 PUFFS BY MOUTH EVERY 4 HOURS AS NEEDED FOR WHEEZE OR FOR SHORTNESS OF BREATH 8 each 4   allopurinol  (ZYLOPRIM ) 100 MG tablet Take 100 mg by mouth daily. (Patient not taking: No sig reported)     apixaban  (ELIQUIS ) 5 MG TABS tablet Take 1 tablet (5 mg total) by mouth 2 (two) times daily. 180 tablet 1   atorvastatin  (LIPITOR) 20 MG tablet Take 1 tablet (20 mg total) by mouth daily. 90 tablet 3   benzonatate  (TESSALON ) 100 MG capsule Take 1 capsule (100 mg total) by mouth every 8 (eight) hours. 21 capsule 0   cyanocobalamin  (VITAMIN B12) 1000 MCG/ML injection Inject 1,000 mcg into the skin every 30 (thirty) days.     cyclobenzaprine  (FLEXERIL ) 10 MG tablet TAKE 1 TABLET BY MOUTH THREE TIMES A DAY AS NEEDED FOR MUSCLE SPASMS 90 tablet 3   diltiazem  (CARDIZEM  CD) 180 MG  24 hr capsule Take 1 capsule (180 mg total) by mouth daily. 90 capsule 3   doxycycline  (VIBRAMYCIN ) 100 MG capsule Take 1 capsule (100 mg total) by mouth 2 (two) times daily. 20 capsule 0   DULoxetine  (CYMBALTA ) 30 MG capsule Take 30 mg by mouth 2 (two) times daily.     escitalopram  (LEXAPRO ) 10 MG tablet Take 1 tablet (10 mg total) by mouth daily. 90 tablet 1   ferrous gluconate  (FERGON) 324 MG tablet Take 1 tablet (324 mg total) by mouth daily. 30 tablet 0   ipratropium-albuterol  (DUONEB) 0.5-2.5 (3) MG/3ML SOLN 1 NEBULE EVERY 6 HOURS AS NEEDED. **J45.3** 270 mL 4   isosorbide  mononitrate (IMDUR ) 30 MG 24 hr tablet Take 30 mg by mouth daily.     lamoTRIgine  (LAMICTAL ) 25 MG tablet Take by mouth.     nutrition supplement, JUVEN, (JUVEN) PACK Take 1 packet by mouth 2 (two) times daily between meals.     Olodaterol HCl 2.5 MCG/ACT AERS Inhale 2 Inhalations into the lungs daily. Brand name Striverdi     oxyCODONE  (OXY IR/ROXICODONE ) 5 MG immediate release tablet Take 1 tablet (5 mg total) by mouth every 4 (four) hours as needed for severe pain (pain score 7-10) or moderate pain (pain score 4-6). 30 tablet 0   pantoprazole  (PROTONIX ) 40 MG tablet Take 1 tablet (40 mg total) by mouth 2 (two) times daily before a meal. 60 tablet 0   predniSONE  (DELTASONE ) 5 MG tablet Take 4 tablets (20 mg total) by mouth daily with breakfast. 3 tabs daily     pregabalin  (LYRICA ) 150 MG capsule Take 1 capsule (150 mg total) by mouth  2 (two) times daily. (Patient not taking: No sig reported) 60 capsule 2   pregabalin  (LYRICA ) 75 MG capsule Take 1 capsule (75 mg total) by mouth daily. 90 capsule 1   sulfamethoxazole -trimethoprim  (BACTRIM ) 400-80 MG tablet Take 1 tablet by mouth 2 (two) times daily. 60 tablet 5   tamsulosin  (FLOMAX ) 0.4 MG CAPS capsule Take 1 capsule (0.4 mg total) by mouth at bedtime. 30 capsule 0   Tiotropium Bromide-Olodaterol (STIOLTO RESPIMAT ) 2.5-2.5 MCG/ACT AERS Inhale 2 puffs into the lungs daily. 4  g 5   [DISCONTINUED] predniSONE  (DELTASONE ) 10 MG tablet Take 1 tablet (10 mg total) by mouth daily with breakfast. 90 tablet 2      Home: Home Living Family/patient expects to be discharged to:: Private residence Living Arrangements: Spouse/significant other Available Help at Discharge: Family, Available 24 hours/day Type of Home: House Home Access: Ramped entrance Home Layout: Able to live on main level with bedroom/bathroom Bathroom Shower/Tub: Health Visitor: Handicapped height Bathroom Accessibility: Yes Home Equipment: Information systems manager, BSC/3in1, Agricultural Consultant (2 wheels), Cane - single point, Rollator (4 wheels), Wheelchair - manual, Toilet riser, Tub bench (drop arm BSC, slideboard, sock aid) Additional Comments: has not been using prosthetic   Functional History: Prior Function Prior Level of Function : Needs assist Mobility Comments: most recently using a slide board. Has a R LE prosthetic, but has been unable to use due to wounds/infections ADLs Comments: wife assists PRN for ADLs; has aide 2x per week per report but unsure accuracy  Functional Status:  Mobility: Bed Mobility Overal bed mobility: Needs Assistance Bed Mobility: Supine to Sit Supine to sit: Min assist, HOB elevated, Used rails General bed mobility comments: min assist with trunk Transfers Overall transfer level: Needs assistance Equipment used: None Transfers: Bed to chair/wheelchair/BSC, Sit to/from Stand Bed to/from chair/wheelchair/BSC transfer type:: Lateral/scoot transfer Squat pivot transfers: Max assist, +2 physical assistance, +2 safety/equipment  Lateral/Scoot Transfers: Mod assist General transfer comment: mod assist for lateral scoot transfer to right to drop arm recliner with use of bed pad Ambulation/Gait General Gait Details: unable at baseline    ADL: ADL Overall ADL's : Needs assistance/impaired Eating/Feeding: Set up, Sitting Grooming: Wash/dry hands, Wash/dry  face, Oral care, Set up, Sitting Upper Body Bathing: Set up, Sitting Lower Body Bathing: Moderate assistance Upper Body Dressing : Set up, Sitting Lower Body Dressing: Maximal assistance Lower Body Dressing Details (indicate cue type and reason): for left shoe Toilet Transfer: Maximal assistance, +2 for physical assistance, +2 for safety/equipment, Squat-pivot Functional mobility during ADLs: Maximal assistance, +2 for physical assistance, +2 for safety/equipment  Cognition: Cognition Orientation Level: Oriented X4 Cognition Arousal: Alert Behavior During Therapy: WFL for tasks assessed/performed, Flat affect  Physical Exam: Blood pressure 137/76, pulse 80, temperature 97.8 F (36.6 C), temperature source Oral, resp. rate 20, height 6' 1 (1.854 m), weight 81.5 kg, SpO2 96%.   General: No apparent distress, Frail, chronically ill appearing male. Raspy voice.   HEENT: Head is normocephalic, atraumatic, sclera anicteric, oral mucosa dry Neck: Supple without JVD or lymphadenopathy Heart: Reg rate and rhythm. No murmurs rubs or gallops Chest: mild raltes at bases, non-labored Abdomen: Soft, non-tender, non-distended, bowel sounds positive. Extremities: R BKA, wound over fib head- please see image, 1+ LLE edema  Psych: Pt's affect is appropriate. Pt is cooperative. Very pleasant Skin:  Right BKA with evidence of two small healed abrasions. Tibial tuberosity with pressure areas, petechiae right inner thigh. Open area (appears to have 1 cm depth) right lateral BKA  site around fibular head. Dry flaky paper thin skin with multiple ecchymotic areas and healing skin tears on four limbs. Hematomas on left shin anterior and posteriorly. Buttocks red with small open area on top of gluteal cleft.   Neuro:     Mental Status: AAOx4 Speech/Languate: Naming and repetition intact, fluent, follows simple commands CRANIAL NERVES: II: PERRL. Visual fields full III, IV, VI: EOM intact, no gaze  preference or deviation V: normal sensation bilaterally VII: no asymmetry VIII: normal hearing to speech IX, X: normal palatal elevation XI: head turn intact XII: Tongue midline     MOTOR: RUE: 1-2/5 Deltoid, 4/5 Biceps, 4/5 Triceps,4/5 Grip LUE: 4/5 Deltoid, 4/5 Biceps, 4/5 Triceps, 4/5 Grip RLE: HF 4/5, KE 4/5 LLE: HF 4/5, KE 4/5, ADF 2-3/5, APF 3/5   SENSORY: Altered to LT in b/l UE and LLE   Coordination: Normal finger to nose intact LUE, limited on R due to shoulder weakness   MSK: decreased PROM R shoulder- chronic                Results for orders placed or performed during the hospital encounter of 06/19/24 (from the past 48 hours)  Basic metabolic panel with GFR     Status: Abnormal   Collection Time: 06/22/24  2:24 AM  Result Value Ref Range   Sodium 140 135 - 145 mmol/L   Potassium 3.9 3.5 - 5.1 mmol/L   Chloride 100 98 - 111 mmol/L   CO2 30 22 - 32 mmol/L   Glucose, Bld 76 70 - 99 mg/dL    Comment: Glucose reference range applies only to samples taken after fasting for at least 8 hours.   BUN 23 8 - 23 mg/dL   Creatinine, Ser 9.15 0.61 - 1.24 mg/dL   Calcium  8.5 (L) 8.9 - 10.3 mg/dL   GFR, Estimated >39 >39 mL/min    Comment: (NOTE) Calculated using the CKD-EPI Creatinine Equation (2021)    Anion gap 10 5 - 15    Comment: Performed at [facility], [address], [place], [place] [address]  Magnesium      Status: None   Collection Time: 06/22/24  2:24 AM  Result Value Ref Range   Magnesium  2.4 1.7 - 2.4 mg/dL    Comment: Performed at [facility], [address] [place], [place] [address]  Glucose, capillary     Status: None   Collection Time: 06/22/24  6:01 AM  Result Value Ref Range   Glucose-Capillary 77 70 - 99 mg/dL    Comment: Glucose reference range applies only to samples taken after fasting for at least 8 hours.  Renal function panel     Status: Abnormal   Collection Time: 06/23/24  2:53 AM  Result Value Ref Range    Sodium 140 135 - 145 mmol/L   Potassium 4.3 3.5 - 5.1 mmol/L   Chloride 103 98 - 111 mmol/L   CO2 29 22 - 32 mmol/L   Glucose, Bld 90 70 - 99 mg/dL    Comment: Glucose reference range applies only to samples taken after fasting for at least 8 hours.   BUN 23 8 - 23 mg/dL   Creatinine, Ser 9.23 0.61 - 1.24 mg/dL   Calcium  8.6 (L) 8.9 - 10.3 mg/dL   Phosphorus 3.2 2.5 - 4.6 mg/dL   Albumin 3.2 (L) 3.5 - 5.0 g/dL   GFR, Estimated >39 >39 mL/min    Comment: (NOTE) Calculated using the CKD-EPI Creatinine Equation (2021)  Anion gap 8 5 - 15    Comment: Performed at [facility], [address], [place], [place] [address]  Glucose, capillary     Status: None   Collection Time: 06/23/24  5:37 AM  Result Value Ref Range   Glucose-Capillary 72 70 - 99 mg/dL    Comment: Glucose reference range applies only to samples taken after fasting for at least 8 hours.   *Note: Due to a large number of results and/or encounters for the requested time period, some results have not been displayed. A complete set of results can be found in Results Review.   No results found.    Blood pressure 137/76, pulse 80, temperature 97.8 F (36.6 C), temperature source Oral, resp. rate 20, height 6' 1 (1.854 m), weight 81.5 kg, SpO2 96%.  Medical Problem List and Plan: 1. Functional deficits secondary to Debility due to CHF with hx of R BKA  -patient may shower  -ELOS/Goals: 10-14 days, min A PT/OT  -Admit to CIR 2.  Antithrombotics: -DVT/anticoagulation:  Pharmaceutical: Eliquis   -antiplatelet therapy: N/A 3. Pain Management: Tylenol  prn for mild and oxycodone  prn for severe pain.  Chronic R shoulder injury.  4. Mood/Behavior/Sleep: LCSW to follow for evaluation and support.   -antipsychotic agents: N/A 5. Neuropsych/cognition: This patient is capable of making decisions on his own behalf. 6. Skin/Wound Care: Routine pressure relief 7. Fluids/Electrolytes/Nutrition: Monitor I/O. Check CMET in  am.  --[employer] added for  nutritional supplement/wound healing.  8. Acute on chronic diastolic CHF: Strict I/O with daily weights.   --on Losartan , Spironolactone , lasix  and Lipitor.   9. Hypokalemia: Monitor for recurrence with Lasix  on board. K+ 4.3 on 1/28 10.  PAF: Monitor HR TID--on diltiazem  and Apixaban . 11.  Asthma/COPD: Continue chronic steroids--prednisone  15 mg w/  with MDI. 12. GERD: Managed on Protonix .  13.  Hx of Seizures: Controlled on Lamotrigine  14. BPH: Continue Flomax . Continent. 15. Neuropathy: On Lyrica  and cymbalta .  16. Thrombocytopenia: Boderline low and likely reactive. Recheck platelets in am.  --Monitor for signs of bleeding 17.Buttock pain: Reports severe with certain movements. EPC cream with foam dressing.  --Hx of sacral decub--Buttocks red with small open area on top of gluteal cleft --Will order air mattress for pressure relief and to prevent breakdown.  18. Anemia of chronic disease?: Will order anemia panel.  19. HLD: continue statin    Pamela S Love, PA-C 06/23/2024  I have personally performed a face to face diagnostic evaluation of this patient and formulated the key components of the plan.  Additionally, I have personally reviewed laboratory data, imaging studies, as well as relevant notes and concur with the physician assistant's documentation above.  The patient's status has not changed from the original H&P.  Any changes in documentation from the acute care chart have been noted above.  Murray Collier, MD     [1]  Allergies Allergen Reactions   Levaquin  [Levofloxacin ] Itching    Tolerated ciprofloxacin  in 2022.   "

## 2024-06-24 ENCOUNTER — Other Ambulatory Visit: Payer: Self-pay

## 2024-06-24 ENCOUNTER — Encounter (HOSPITAL_COMMUNITY): Payer: Self-pay | Admitting: Physical Medicine & Rehabilitation

## 2024-06-24 DIAGNOSIS — R5381 Other malaise: Secondary | ICD-10-CM | POA: Diagnosis not present

## 2024-06-24 DIAGNOSIS — I5033 Acute on chronic diastolic (congestive) heart failure: Secondary | ICD-10-CM | POA: Diagnosis not present

## 2024-06-24 DIAGNOSIS — D696 Thrombocytopenia, unspecified: Secondary | ICD-10-CM | POA: Diagnosis not present

## 2024-06-24 DIAGNOSIS — D72829 Elevated white blood cell count, unspecified: Secondary | ICD-10-CM

## 2024-06-24 DIAGNOSIS — D638 Anemia in other chronic diseases classified elsewhere: Secondary | ICD-10-CM

## 2024-06-24 DIAGNOSIS — E876 Hypokalemia: Secondary | ICD-10-CM | POA: Diagnosis not present

## 2024-06-24 LAB — CBC WITH DIFFERENTIAL/PLATELET
Abs Immature Granulocytes: 0.46 10*3/uL — ABNORMAL HIGH (ref 0.00–0.07)
Basophils Absolute: 0.1 10*3/uL (ref 0.0–0.1)
Basophils Relative: 1 %
Eosinophils Absolute: 0 10*3/uL (ref 0.0–0.5)
Eosinophils Relative: 0 %
HCT: 35.7 % — ABNORMAL LOW (ref 39.0–52.0)
Hemoglobin: 11 g/dL — ABNORMAL LOW (ref 13.0–17.0)
Immature Granulocytes: 4 %
Lymphocytes Relative: 19 %
Lymphs Abs: 2 10*3/uL (ref 0.7–4.0)
MCH: 28 pg (ref 26.0–34.0)
MCHC: 30.8 g/dL (ref 30.0–36.0)
MCV: 90.8 fL (ref 80.0–100.0)
Monocytes Absolute: 0.7 10*3/uL (ref 0.1–1.0)
Monocytes Relative: 7 %
Neutro Abs: 7.5 10*3/uL (ref 1.7–7.7)
Neutrophils Relative %: 69 %
Platelets: 165 10*3/uL (ref 150–400)
RBC: 3.93 MIL/uL — ABNORMAL LOW (ref 4.22–5.81)
RDW: 17 % — ABNORMAL HIGH (ref 11.5–15.5)
WBC: 10.8 10*3/uL — ABNORMAL HIGH (ref 4.0–10.5)
nRBC: 0 % (ref 0.0–0.2)

## 2024-06-24 LAB — COMPREHENSIVE METABOLIC PANEL WITH GFR
ALT: 27 U/L (ref 0–44)
AST: 20 U/L (ref 15–41)
Albumin: 3.3 g/dL — ABNORMAL LOW (ref 3.5–5.0)
Alkaline Phosphatase: 128 U/L — ABNORMAL HIGH (ref 38–126)
Anion gap: 8 (ref 5–15)
BUN: 22 mg/dL (ref 8–23)
CO2: 28 mmol/L (ref 22–32)
Calcium: 8.7 mg/dL — ABNORMAL LOW (ref 8.9–10.3)
Chloride: 103 mmol/L (ref 98–111)
Creatinine, Ser: 0.68 mg/dL (ref 0.61–1.24)
GFR, Estimated: >60 mL/min
Glucose, Bld: 80 mg/dL (ref 70–99)
Potassium: 3.6 mmol/L (ref 3.5–5.1)
Sodium: 138 mmol/L (ref 135–145)
Total Bilirubin: 0.3 mg/dL (ref 0.0–1.2)
Total Protein: 5.3 g/dL — ABNORMAL LOW (ref 6.5–8.1)

## 2024-06-24 LAB — RETICULOCYTES
Immature Retic Fract: 19.4 % — ABNORMAL HIGH (ref 2.3–15.9)
RBC.: 3.96 MIL/uL — ABNORMAL LOW (ref 4.22–5.81)
Retic Count, Absolute: 107.3 10*3/uL (ref 19.0–186.0)
Retic Ct Pct: 2.7 % (ref 0.4–3.1)

## 2024-06-24 LAB — IRON AND TIBC
Iron: 20 ug/dL — ABNORMAL LOW (ref 45–182)
Saturation Ratios: 7 % — ABNORMAL LOW (ref 17.9–39.5)
TIBC: 290 ug/dL (ref 250–450)
UIBC: 270 ug/dL

## 2024-06-24 LAB — CULTURE, BLOOD (ROUTINE X 2)
Culture: NO GROWTH
Culture: NO GROWTH
Special Requests: ADEQUATE
Special Requests: ADEQUATE

## 2024-06-24 LAB — GLUCOSE, CAPILLARY: Glucose-Capillary: 87 mg/dL (ref 70–99)

## 2024-06-24 LAB — FERRITIN: Ferritin: 161 ng/mL (ref 24–336)

## 2024-06-24 LAB — FOLATE: Folate: 12.3 ng/mL

## 2024-06-24 LAB — VITAMIN B12: Vitamin B-12: 3166 pg/mL — ABNORMAL HIGH (ref 180–914)

## 2024-06-24 MED ORDER — PROSOURCE PLUS PO LIQD
30.0000 mL | Freq: Two times a day (BID) | ORAL | Status: AC
  Administered 2024-06-24 – 2024-07-02 (×12): 30 mL via ORAL
  Filled 2024-06-24 (×16): qty 30

## 2024-06-24 NOTE — Progress Notes (Signed)
 Met with patient and wife to review current situation, team conference and plan of care. Reviewed medications, dietary modifications, daily weight, wound care. Continue to follow along to provide educational needs to facilitate preparation for discharge.

## 2024-06-24 NOTE — Plan of Care (Signed)
" °  Problem: RH Balance Goal: LTG: Patient will maintain dynamic sitting balance (OT) Description: LTG:  Patient will maintain dynamic sitting balance with assistance during activities of daily living (OT) Flowsheets (Taken 06/24/2024 1216) LTG: Pt will maintain dynamic sitting balance during ADLs with: Supervision/Verbal cueing Goal: LTG Patient will maintain dynamic standing with ADLs (OT) Description: LTG:  Patient will maintain dynamic standing balance with assist during activities of daily living (OT)  Flowsheets (Taken 06/24/2024 1216) LTG: Pt will maintain dynamic standing balance during ADLs with: Minimal Assistance - Patient > 75%   Problem: RH Bathing Goal: LTG Patient will bathe all body parts with assist levels (OT) Description: LTG: Patient will bathe all body parts with assist levels (OT) Flowsheets (Taken 06/24/2024 1216) LTG: Pt will perform bathing with assistance level/cueing: Minimal Assistance - Patient > 75%   Problem: RH Dressing Goal: LTG Patient will perform upper body dressing (OT) Description: LTG Patient will perform upper body dressing with assist, with/without cues (OT). Flowsheets (Taken 06/24/2024 1216) LTG: Pt will perform upper body dressing with assistance level of: Supervision/Verbal cueing Goal: LTG Patient will perform lower body dressing w/assist (OT) Description: LTG: Patient will perform lower body dressing with assist, with/without cues in positioning using equipment (OT) Flowsheets (Taken 06/24/2024 1216) LTG: Pt will perform lower body dressing with assistance level of: Minimal Assistance - Patient > 75%   Problem: RH Toileting Goal: LTG Patient will perform toileting task (3/3 steps) with assistance level (OT) Description: LTG: Patient will perform toileting task (3/3 steps) with assistance level (OT)  Flowsheets (Taken 06/24/2024 1216) LTG: Pt will perform toileting task (3/3 steps) with assistance level: Minimal Assistance - Patient > 75%    Problem: RH Toilet Transfers Goal: LTG Patient will perform toilet transfers w/assist (OT) Description: LTG: Patient will perform toilet transfers with assist, with/without cues using equipment (OT) Flowsheets (Taken 06/24/2024 1216) LTG: Pt will perform toilet transfers with assistance level of: Contact Guard/Touching assist   Problem: RH Tub/Shower Transfers Goal: LTG Patient will perform tub/shower transfers w/assist (OT) Description: LTG: Patient will perform tub/shower transfers with assist, with/without cues using equipment (OT) Flowsheets (Taken 06/24/2024 1216) LTG: Pt will perform tub/shower stall transfers with assistance level of: Minimal Assistance - Patient > 75% LTG: Pt will perform tub/shower transfers from: Tub/shower combination   "

## 2024-06-24 NOTE — Progress Notes (Signed)
 " Inpatient Rehabilitation Care Coordinator Assessment and Plan Patient Details  Name: Levi [name] MRN: [id number] Date of Birth: Dec 23, 1945  Today's Date: 06/24/2024  Hospital Problems: Principal Problem:   Debility  Past Medical History:  Past Medical History:  Diagnosis Date   Agent orange exposure 1970's   takes Imdur ,Diltiazem , and Atacand  daily   Agent orange exposure    ALLERGIC RHINITIS    Allergy     seasonal allergies   Anemia    iron  deficiency   Arthritis    Asthma    uses inhaler and nebulizers   Atrial fibrillation (HCC)    Bruises easily    d/t prednisone  daily   Cataract    Cellulitis 03/22/2020   Chest wall pain 09/21/2019   L scapula, reported 4/27 21/     CHF (congestive heart failure) (HCC)    dx-on meds to treat   Chronic bronchitis (HCC)    used to get it q yr; last time ?2008 (04/28/2012)   Clotting disorder 2019   On blood thinner when I bleed it is for a consideral amount of time. This is tegardless of size of wound.   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 2010   History of total right hip arthroplasty 07/31/2015   Hyperlipidemia    takes Lipitor daily; pt. states he takes a preventive   Hypothyroidism    Joint pain    Joint swelling    Leukocytosis    Lumbar stenosis status post T12-S1 fusion 04/07/2017    MSSA bacteremia 03/19/2023   Nausea and vomiting 11/12/2022   Neuromuscular disorder (HCC)    bilat legs, and bilat arms   Neuropathy    Osteoarthritis of left knee 01/10/2012   Osteoporosis 2012   PAF (paroxysmal atrial fibrillation) (HCC)    a. Dx 04/2012, CHA2DS2VASc = 1 (age);  b. 04/2012 Echo: EF 40-50%, mild MR.   Personal history of colonic adenomas 02/09/2013   Pneumonia    Primary osteoarthritis of right knee 06/17/2014   Seizures (HCC) 06/20/2024   Sepsis (HCC) 03/18/2023   Shortness of breath dyspnea    Thyroid  disease    no taking meds-caused dizziness   Past Surgical History:  Past Surgical History:  Procedure Laterality Date   87 HOUR PH STUDY N/A 05/30/2021   Procedure: 24 HOUR PH STUDY;  Surgeon: San Sandor GAILS, DO;  Location: [facility];  Service: Gastroenterology;  Laterality: N/A;   AMPUTATION Right 11/14/2023   Procedure: AMPUTATION BELOW KNEE;  Surgeon: Harden Jerona GAILS, MD;  [facility];  Service: Orthopedics;  Laterality: Right;   AMPUTATION TOE Bilateral 08/13/2023   Procedure: LEFT SECOND TOE AMPUTATION AND RIGHT FIFTH TOE AMPUTATION;  Surgeon: Malvin Blunt  F, DPM;  Location: [facility] OR;  Service: Orthopedics/Podiatry;  Laterality: Bilateral;   ANTERIOR CERVICAL DECOMP/DISCECTOMY FUSION N/A 07/21/2012   Procedure: ANTERIOR CERVICAL DECOMPRESSION/DISCECTOMY FUSION 2 LEVELS;  Surgeon: Victory Gens, MD;  Location: [facility];  Service: Neurosurgery;  Laterality: N/A;  C4-5 C5-6 Anterior cervical decompression/diskectomy/fusion   ANTERIOR LAT LUMBAR FUSION N/A 04/07/2017   Procedure: Thoracic twelve-Lumbar one Anterolateral decompression/fusion;  Surgeon: Gens Victory, MD;  Location: [facility];  Service: Neurosurgery;  Laterality: N/A;   ARTHRODESIS FOOT WITH WEIL OSTEOTOMY Left 11/07/2022   Procedure: LEFT THIRD METATARSOPHALANGEAL CAPSULOTOMY, WEIL OSTEOTOMY;  Surgeon: Elsa Lonni SAUNDERS, MD;  Location: [facility] OR;  Service: Orthopedics;  Laterality:  Left;  LENGTH OF SURGERY: 60 MINUTES   BACK SURGERY     x 3   BELOW KNEE LEG AMPUTATION Right    June 2025   BONE EXOSTOSIS EXCISION Right 10/16/2023   Procedure: EXCISION, EXOSTOSIS;  Surgeon: Janit Thresa HERO, DPM;  Location: [facility];  Service: Orthopedics/Podiatry;  Laterality: Right;   CARDIAC CATHETERIZATION  11/2008   Dr. Jeffrie - 20% calcified non flow limiting left main, 50% EF apical hypokinesis   CARPAL TUNNEL RELEASE Bilateral    CERVICAL DISC ARTHROPLASTY N/A 04/07/2017   Procedure: Cervical six-seven Disc arthroplasty;  Surgeon: Gens Victory, MD;  Location[facility] OR;  Service: Neurosurgery;  Laterality: N/A;   CHEST TUBE INSERTION Left 04/11/2017   Procedure: CHEST TUBE INSERTION;  Surgeon: Fleeta Hanford Coy, MD;  Location: [facility] OR;  Service: Thoracic;  Laterality: Left;   CHOLECYSTECTOMY     COLONOSCOPY  2016   CG-MAC-miralax (good)servere TICS/TA x 2   ESOPHAGEAL MANOMETRY N/A 05/30/2021   Procedure: ESOPHAGEAL MANOMETRY (EM);  Surgeon: San Sandor GAILS, DO;  Location: [facility];  Service: Gastroenterology;  Laterality: N/A;  ph impedence   ESOPHAGOGASTRODUODENOSCOPY     EYE SURGERY Bilateral 2011   steroidal encapsulation (04/28/2012)   FOOT SURGERY Right    x 2   JOINT REPLACEMENT     Many surgeries including 5 joit replacements   KNEE ARTHROSCOPY Bilateral    LATERAL / POSTERIOR COMBINED FUSION LUMBAR SPINE  2012   LEFT ATRIAL APPENDAGE OCCLUSION N/A 02/22/2021   Procedure: LEFT ATRIAL APPENDAGE OCCLUSION;  Surgeon: Cindie Ole DASEN, MD;  [facility];  Service: Cardiovascular;  Laterality: N/A;   NEUROPLASTY / TRANSPOSITION ULNAR NERVE AT ELBOW Bilateral    POLYPECTOMY  2016   severe TICS/TA x 2   POSTERIOR CERVICAL LAMINECTOMY WITH MET- RX Right 06/11/2021   Procedure: Right Cervical six-seven  Laminectomy and foraminotomy with metrex;  Surgeon: Gens Victory, MD;  Location[facility];  Service: Neurosurgery;  Laterality: Right;   REFRACTIVE SURGERY  Left 07/07/2023   laser surgery but still has floaters   REVERSE SHOULDER ARTHROPLASTY  04/28/2012   Procedure: REVERSE SHOULDER ARTHROPLASTY;  Surgeon: Eva Elsie Herring, MD;  Location: [facility] OR;  Service: Orthopedics;  Laterality: Left;  Left shouder reverse total shoulder arthroplasty   SHOULDER SURGERY     SPINE SURGERY     I have had 7 spinal surgeries   TEE WITHOUT CARDIOVERSION N/A 02/22/2021   Procedure: TRANSESOPHAGEAL ECHOCARDIOGRAM (TEE);  Surgeon: Cindie Ole DASEN, MD;  Location: [facility] INVASIVE CV LAB;  Service: Cardiovascular;  Laterality: N/A;   TENDON REPAIR Right 08/07/2020   Procedure: RIGHT HAND LIGAMENT RECONSTRUCTION AND TENDON INTERPOSITION;  Surgeon: Yvone Rush, MD;  Location: [facility];  Service: Orthopedics;  Laterality: Right;   TONSILLECTOMY AND ADENOIDECTOMY  1954  TOTAL HIP ARTHROPLASTY Left 2011   left (04/28/2012)   TOTAL HIP ARTHROPLASTY Right 07/31/2015   Procedure: TOTAL HIP ARTHROPLASTY ANTERIOR APPROACH;  Surgeon: Norleen Gavel, MD;  [facility];  Service: Orthopedics;  Laterality: Right;   TOTAL KNEE ARTHROPLASTY  01/10/2012   Procedure: TOTAL KNEE ARTHROPLASTY;  Surgeon: Norleen LITTIE Gavel, MD;  Location: [facility];  Service: Orthopedics;;  left total knee arthroplasty   TOTAL KNEE ARTHROPLASTY Right 06/17/2014   Procedure: TOTAL KNEE ARTHROPLASTY;  Surgeon: Norleen LITTIE Gavel, MD;  [facility];  Service: Orthopedics;  Laterality: Right;   toupet fundolplication  11/01/2021   TRANSESOPHAGEAL ECHOCARDIOGRAM (CATH LAB) N/A 03/24/2023   Procedure: TRANSESOPHAGEAL ECHOCARDIOGRAM;  Surgeon: Mona Vinie BROCKS, MD;  [facility];  Service: Cardiovascular;  Laterality: N/A;   TRANSESOPHAGEAL ECHOCARDIOGRAM (CATH LAB) N/A 02/25/2024   Procedure: TRANSESOPHAGEAL ECHOCARDIOGRAM;  Surgeon: Jeffrie Oneil BROCKS, MD;  [facility];  Service: Cardiovascular;  Laterality: N/A;   Social History:  reports that he has never smoked.  He has been exposed to tobacco smoke. He has never used smokeless tobacco. He reports that he does not drink alcohol and does not use drugs.  Family / Support Systems Marital Status: Married Patient Roles: Spouse Spouse/Significant Other: [name] Dy Children: Ginnie Anticipated Caregiver: [name], Spouse Ability/Limitations of Caregiver: None reported Caregiver Availability: 24/7 Family Dynamics: Good family support  Social History Preferred language: English Religion: Catholic Education: High school Health Literacy - How often do you need to have someone help you when you read instructions, pamphlets, or other written material from your doctor or pharmacy?: Never Writes: Yes Employment Status: Retired Age Retired: 66 Marine Scientist Issues: N/a Guardian/Conservator: N/a   Abuse/Neglect Abuse/Neglect Assessment Can Be Completed: Yes Physical Abuse: Denies Verbal Abuse: Denies Sexual Abuse: Denies Exploitation of patient/patient's resources: Denies Self-Neglect: Denies  Patient response to: Social Isolation - How often do you feel lonely or isolated from those around you?: Never  Emotional Status Pt's affect, behavior and adjustment status: Adjustin well to therapy Recent Psychosocial Issues: None Psychiatric History: None Substance Abuse History: None  Patient / Family Perceptions, Expectations & Goals Pt/Family understanding of illness & functional limitations: Patient/family understandsing of illness & functional limitations Premorbid pt/family roles/activities: Minimal activity in the community Anticipated changes in roles/activities/participation: None Pt/family expectations/goals: None  [employer]: None Premorbid Home Care/DME Agencies: None Transportation available at discharge: Yes, [name] Is the patient able to respond to transportation needs?: Yes In the past 12 months, has lack of transportation kept you from  medical appointments or from getting medications?: No In the past 12 months, has lack of transportation kept you from meetings, work, or from getting things needed for daily living?: No  Discharge Planning Living Arrangements: Spouse/significant other Support Systems: Spouse/significant other Type of Residence: Private residence Insurance Resources: Electrical Engineer Resources: Restaurant Manager, Fast Food Screen Referred: Yes Living Expenses: Own Money Management: Patient, Spouse Does the patient have any problems obtaining your medications?: No Home Management: Patient/spouse manages home Patient/Family Preliminary Plans: Plans to return back home with spouse, [facility] Coordinator Anticipated Follow Up Needs: HH/OP DC Planning Additional Notes/Comments: Has DME from previous stay: Shower seat, BSC/3in1, Agricultural Consultant (2 wheels), Cane - single point, Rollator (4 wheels), Wheelchair - manual, Toilet riser, Tub bench (drop arm BSC, slideboard, sock aid) Expected length of stay: 12-15 days  Clinical Impression CSW met with patient/family to introduce herself and complete initial assessment. Patient presented to [facility] following Debility. Patient  is able to make needs known. Spouse, [name] present during assessment. Patient's plan has not changed from the previous [facility] admission. They have most DME from previous [facility] stay including Orthotics/Prosthetics for right leg.   There were no further needs or concerns at present. CSW will follow up with family and continue to follow. Will provide patient/family with an update as soon as one becomes available.   Di'Asia  Loreli 06/24/2024, 2:59 PM    "

## 2024-06-24 NOTE — Progress Notes (Signed)
 Inpatient Rehabilitation Center Individual Statement of Services  Patient Name:  Levi [name]  Date:  06/24/2024  Welcome to the Inpatient Rehabilitation Center.  Our goal is to provide you with an individualized program based on your diagnosis and situation, designed to meet your specific needs.  With this comprehensive rehabilitation program, you will be expected to participate in at least 3 hours of rehabilitation therapies Monday-Friday, with modified therapy programming on the weekends.  Your rehabilitation program will include the following services:  Physical Therapy (PT), Occupational Therapy (OT), Speech Therapy (ST), 24 hour per day rehabilitation nursing, Therapeutic Recreaction (TR), Care Coordinator, Rehabilitation Medicine, Nutrition Services, and Pharmacy Services  Weekly team conferences will be held on Wednesday to discuss your progress.  Your Inpatient Rehabilitation Care Coordinator will talk with you frequently to get your input and to update you on team discussions.  Team conferences with you and your family in attendance may also be held.  Expected length of stay: 12-15 days  Overall anticipated outcome: Minimal Assistance - Patient > 75% to Contact Guard/Touching assist   Depending on your progress and recovery, your program may change. Your Inpatient Rehabilitation Care Coordinator will coordinate services and will keep you informed of any changes. Your Inpatient Rehabilitation Care Coordinator's name and contact numbers are listed  below.  The following services may also be recommended but are not provided by the Inpatient Rehabilitation Center:  Driving Evaluations Home Health Rehabiltiation Services Outpatient Rehabilitation Services Vocational Rehabilitation   Arrangements will be made to provide these services after discharge if needed.  Arrangements include referral to agencies that provide these services.  Your insurance has been verified to be: MEDICARE /  MEDICARE PART A AND B  Your primary doctor is: [name] [place], [place]   Pertinent information will be shared with your doctor and your insurance company.  Inpatient Rehabilitation Care Coordinator:  [name] [phone] or [name]  Information discussed with and copy given to patient by: [name], 06/24/2024, 3:02 PM

## 2024-06-24 NOTE — Progress Notes (Signed)
 Physical Therapy Session Note  Patient Details  Name: Levi [name] MRN: [id number] Date of Birth: [birth year]  Today's Date: 06/24/2024 PT Individual Time: 1350-1500 PT Individual Time Calculation (min): 70 min   Short Term Goals: Week 1:  PT Short Term Goal 1 (Week 1): pt will perform bed mobility with CGA overall PT Short Term Goal 2 (Week 1): pt will transfer bed<>chair with LRAD and CGA PT Short Term Goal 3 (Week 1): pt will perform sit<>stand with LRAD and mod A  Skilled Therapeutic Interventions/Progress Updates:   Pt presents up in w/c. Transported to gym for energy conservation. Discussed pt's prior status with prosthetic though now will need to be assessed for fit and safety due to skin breakdown.  Mod A slideboard transfer from w.c to mat table with focus on technique - difficulty with coordination head hips relationship. Cues for technique, weightshifting and hand placement.  Focused on core and BLE therex to address strength, ROM, balance, and endurance. 2.2 kg weighted medicine ball for PNF diagnonals x 10 reps each direction (limited range due to shoulder limitation) 2.2 kg weighted medicine ball for trunk rotation x 10 reps each direction Supine hip flexion BLE x 15 reps Supine hip abduction BLE x 15 reps Supine SAQ with 5 sec hold BLE x 15 reps Modified bridging x 15 reps with RLE supported on bolster with 1-2 sec hold, cues for exhale on exertion Modified crunches x 15 reps with 1-2 sec hold, cues for exhale on exertion  Completed bed mobility on flat mat with supervision for sit to supine and min A for supine to sit with cues for technique and trunk activation.   Reciprocal scooting on EOM with focus on lateral scooting and clearance though bottom for carryover for transfers. Improved clearance with scooting to the R > L due to weightshifting and WB though RLE only.  Initially CGA to scoot back into w/c but min A needed once on compliant surface of cushion and BUE  support on armrests to complete repositioning in the w/c. Assist with w/c parts needed.  BUE strengthening and endurance with w/c propulsion x 31' with S - discussed option for specialty w/c due for shoulder preservation (already with R shoulder issue), specialty cushion due to skin breakdown, etc.  Upon return to room education provided in regards to amputee support groups and peer support as pt discussing overall situation. Would benefit from follow up during stay to continue to investigate.    Therapy Documentation Precautions:  Precautions Precautions: Fall Recall of Precautions/Restrictions: Intact Precaution/Restrictions Comments: acute compression deformities T6-T7; h/o R BKA with wounds on distal aspect of limb Restrictions Weight Bearing Restrictions Per Provider Order: Yes RLE Weight Bearing Per Provider Order: Non weight bearing    Pain:   Declines intervention at this time    Therapy/Group: Individual Therapy  Elnor Pizza Sherrell Pizza WENDI Elnor, PT, DPT, CBIS  06/24/2024, 3:18 PM

## 2024-06-24 NOTE — Progress Notes (Addendum)
 "                                                        PROGRESS NOTE   Subjective/Complaints: No new complaints this AM. Chronic shoulder pain, overall under control. LBM 1/27  ROS: Patient denies fever, new vision changes, dizziness, nausea, vomiting, diarrhea,  shortness of breath or chest pain, headache, or mood change.  Denies constipaiton    Objective:   No results found. Recent Labs    06/24/24 0432  WBC 10.8*  HGB 11.0*  HCT 35.7*  PLT 165   Recent Labs    06/23/24 0253 06/24/24 0432  NA 140 138  K 4.3 3.6  CL 103 103  CO2 29 28  GLUCOSE 90 80  BUN 23 22  CREATININE 0.76 0.68  CALCIUM  8.6* 8.7*    Intake/Output Summary (Last 24 hours) at 06/24/2024 1053 Last data filed at 06/24/2024 0700 Gross per 24 hour  Intake 236 ml  Output 300 ml  Net -64 ml        Physical Exam: Vital Signs Blood pressure (!) 143/76, pulse 84, temperature 98.2 F (36.8 C), temperature source Oral, resp. rate 20, height 6' 0.99 (1.854 m), weight 79.3 kg, SpO2 96%.   General: No apparent distress, Frail, chronically ill appearing male. Sitting in WC HEENT: Head is normocephalic, atraumatic, sclera anicteric, oral mucosa dry Neck: Supple without JVD or lymphadenopathy Heart: Reg rate and rhythm. No murmurs rubs or gallops Chest: mild rales at bases, non-labored Abdomen: Soft, non-tender, non-distended, bowel sounds positive. Extremities: R BKA, wound over fib head- please see image, 1+ LLE edema  Psych: Pt's affect is appropriate. Pt is cooperative. Very pleasant Skin:  Right BKA with evidence of two small healed abrasions. Tibial tuberosity with pressure areas, petechiae right inner thigh. Open area (appears to have 1 cm depth) right lateral BKA site around fibular head. Dry flaky paper thin skin with multiple ecchymotic areas and healing skin tears on four limbs. Hematomas on left shin anterior and posteriorly. Buttocks red with small open area on top of gluteal cleft.    Neuro:     Mental Status: AAOx4 Speech/Languate: Naming and repetition intact, fluent, follows simple commands CRANIAL NERVES: 2-12 grossly intact     MOTOR: RUE: 1-2/5 Deltoid, 4/5 Biceps, 4/5 Triceps,4/5 Grip LUE: 4/5 Deltoid, 4/5 Biceps, 4/5 Triceps, 4/5 Grip RLE: HF 4/5, KE 4/5 LLE: HF 4/5, KE 4/5, ADF 2-3/5, APF 3/5   SENSORY: Altered to LT in b/l UE and LLE   MSK: decreased PROM R shoulder- chronic  Prior neuro assessment is c/w today's exam 06/24/2024.               Assessment/Plan: 1. Functional deficits which require 3+ hours per day of interdisciplinary therapy in a comprehensive inpatient rehab setting. Physiatrist is providing close team supervision and 24 hour management of active medical problems listed below. Physiatrist and rehab team continue to assess barriers to discharge/monitor patient progress toward functional and medical goals  Care Tool:  Bathing              Bathing assist       Upper Body Dressing/Undressing Upper body dressing        Upper body assist      Lower Body Dressing/Undressing Lower body dressing  Lower body assist       Toileting Toileting    Toileting assist       Transfers Chair/bed transfer  Transfers assist     Chair/bed transfer assist level: Moderate Assistance - Patient 50 - 74% (slideboard)     Locomotion Ambulation   Ambulation assist   Ambulation activity did not occur: Safety/medical concerns (weakness, deconditioning, condition of contralateral leg)          Walk 10 feet activity   Assist  Walk 10 feet activity did not occur: Safety/medical concerns (weakness, deconditioning, condition of contralateral leg)        Walk 50 feet activity   Assist Walk 50 feet with 2 turns activity did not occur: Safety/medical concerns (weakness, deconditioning, condition of contralateral leg)         Walk 150 feet activity   Assist Walk 150 feet activity did not  occur: Safety/medical concerns (weakness, deconditioning, condition of contralateral leg)         Walk 10 feet on uneven surface  activity   Assist Walk 10 feet on uneven surfaces activity did not occur: Safety/medical concerns (weakness, deconditioning, condition of contralateral leg)         Wheelchair     Assist Is the patient using a wheelchair?: Yes Type of Wheelchair: Manual Wheelchair activity did not occur: Safety/medical concerns (weakness, deconditioning)         Wheelchair 50 feet with 2 turns activity    Assist    Wheelchair 50 feet with 2 turns activity did not occur: Safety/medical concerns (weakness, deconditioning)       Wheelchair 150 feet activity     Assist  Wheelchair 150 feet activity did not occur: Safety/medical concerns (weakness, deconditioning)       Blood pressure (!) 143/76, pulse 84, temperature 98.2 F (36.8 C), temperature source Oral, resp. rate 20, height 6' 0.99 (1.854 m), weight 79.3 kg, SpO2 96%.   Medical Problem List and Plan: 1. Functional deficits secondary to Debility due to CHF with hx of R BKA             -patient may shower             -ELOS/Goals: 10-14 days, min A PT/OT             -Continue CIR, evals today 2.  Antithrombotics: -DVT/anticoagulation:  Pharmaceutical: Eliquis              -antiplatelet therapy: N/A 3. Pain Management: Tylenol  prn for mild and oxycodone  prn for severe pain.  Chronic R shoulder injury.  4. Mood/Behavior/Sleep: LCSW to follow for evaluation and support.              -antipsychotic agents: N/A 5. Neuropsych/cognition: This patient is capable of making decisions on his own behalf. 6. Skin/Wound Care: Routine pressure relief 7. Fluids/Electrolytes/Nutrition: Monitor I/O. Check CMET in am.             --Juven and [employer] added for  nutritional supplement/wound healing.  8. Acute on chronic diastolic CHF: Strict I/O with daily weights.              --on Losartan ,  Spironolactone , lasix  and Lipitor.    -1/29 no significant signs of overload, continue current regimen   Filed Weights   06/24/24 0300 06/24/24 0425  Weight: 81.5 kg 79.3 kg    9. Hypokalemia: Monitor for recurrence with Lasix  on board. K+ 4.3 on 1/28  1/29 K+ 3.6 monitor 10.  PAF:  Monitor HR TID--on diltiazem  and Apixaban . 11.  Asthma/COPD: Continue chronic steroids--prednisone  15 mg w/  with MDI. 12. GERD: Managed on Protonix .  13.  Hx of Seizures: Controlled on Lamotrigine  14. BPH: Continue Flomax . Continent. 15. Neuropathy: On Lyrica  and cymbalta .  16. Thrombocytopenia: Boderline low and likely reactive. Recheck platelets in am.  --Monitor for signs of bleeding  -1/29PLT 165 improved, monitor 17.Buttock pain: Reports severe with certain movements. EPC cream with foam dressing.  --Hx of sacral decub--Buttocks red with small open area on top of gluteal cleft --Will order air mattress for pressure relief and to prevent breakdown.  18. Anemia of chronic disease?: Will order anemia panel.   -1/29 HGB stable 11  -Ferritin normal, likely anemia of chronic disease 19. HLD: continue statin 20. Leukocytosis-mild   -no signs/symptoms infection noted, monitor  21. Pt reports hx of B12/Vit D supplementation before admission. B12 level was 3,166.  -check D with next labs      LOS: 1 days A FACE TO FACE EVALUATION WAS PERFORMED  Murray Collier 06/24/2024, 10:53 AM     "

## 2024-06-24 NOTE — Progress Notes (Signed)
 Inpatient Rehabilitation Admission Medication Review by a Pharmacist  A complete drug regimen review was completed for this patient to identify any potential clinically significant medication issues.  High Risk Drug Classes Is patient taking? Indication by Medication  Antipsychotic No   Anticoagulant Yes Apixaban : AFib  Antibiotic Yes Bactrim : COPD (while on prednisone )  Opioid Yes Oxycodone : pain  Antiplatelet No   Hypoglycemics/insulin No   Vasoactive Medication Yes Diltiazem , Lasix , Spironolactone , Flomax : HF/BPH/AFib  Chemotherapy No   Other Yes Lipitor: HLD Tessalon : cough Duloxetine , Lyrica : pain/neuropathy Lamictal : seizures Protonix : GERD Prednisone : COPD/Asthma Flexeril : muscle spasms Levalbuterol : SOB Compazine : N/V Trazodone : sleep     Type of Medication Issue Identified Description of Issue Recommendation(s)  Drug Interaction(s) (clinically significant)     Duplicate Therapy     Allergy      No Medication Administration End Date     Incorrect Dose     Additional Drug Therapy Needed     Significant med changes from prior encounter (inform family/care partners about these prior to discharge).  Restart or discontinue as appropriate. Communicate medication changes with patient/family at discharge  Other       Clinically significant medication issues were identified that warrant physician communication and completion of prescribed/recommended actions by midnight of the next day:  No   Time spent performing this drug regimen review (minutes): 30  Thank you for allowing pharmacy to be a part of this patients care.  Bascom JAYSON Louder, PharmD 06/24/2024 12:26 PM   **Pharmacist phone directory can be found on [email or web address] listed under [facility]**

## 2024-06-24 NOTE — Progress Notes (Signed)
 Inpatient Rehabilitation  Patient information reviewed and entered into eRehab system by Burnard Mealing, OTR/L, Rehab Quality Coordinator.   Information including medical coding, functional ability and quality indicators will be reviewed and updated through discharge.

## 2024-06-24 NOTE — Progress Notes (Signed)
 Initial Nutrition Assessment  DOCUMENTATION CODES:   Not applicable  INTERVENTION:  Encouraged adequate intake of meals and supplements to meet increased protein and calorie needs for wound healing  Continue Juven BID, each packet provides 95 calories, 2.5 grams of protein (collagen), and 9.8 grams of carbohydrate (3 grams sugar); also contains 7 grams of L-arginine and L-glutamine, 300 mg vitamin C , 15 mg vitamin E, 1.2 mcg vitamin B-12, 9.5 mg zinc , 200 mg calcium , and 1.5 g  Calcium  Beta-hydroxy-Beta-methylbutyrate to support wound healing  Added ProSource Plus BID, each supplement provides 100 kcal and 15 g protein  Magic cup TID with meals, each supplement provides 290 kcal and 9 grams of protein  Discontinue [employer], pt does not like  *pt is not vegetarian just avoids animal meat; eats fish, eggs, dairy*  NUTRITION DIAGNOSIS:   Inadequate protein intake related to other (see comment) (pt avoids animal meat) as evidenced by per patient/family report, moderate muscle depletion.  GOAL:   Patient will meet greater than or equal to 90% of their needs  MONITOR:   PO intake, Supplement acceptance  REASON FOR ASSESSMENT:   Diagnosis (Malnutrition, BKA)    ASSESSMENT:   Pt with hx of COPD, seizures, cellulitis, osteomyelitis s/p R BKA, and agent orange exposure. Recent admission with SOB and acute exacerbation of heart failure. Admitted to CIR to work on debility.  Pt previously followed by RD team when he underwent BKA to assist with wound healing.  Pt resting in WC during assessment. Pt reports frustration with ordering meals, states he tried to order something at breakfast but was told he could not because he was listed as vegetarian. Pt reports he is not vegetarian, he just does not eat animal meat due to tenderness. He does eat fish, eggs, and dairy. Placed pt on regular diet to allow flexibility in ordering, pt able to choose his meals and appreciative of assistance.  Pt reports he likes Juven supplement and will continue. Pt does not like [employer] supplement. Willing to try ProSource Plus instead.   PTA, pt reports he ate 2x per day. Pt usually skips breakfast but eats well at lunch and dinner. Pt does not take protein supplements at home. Suspect inadequate protein intake given avoidance of animal meat.   Pt reports doing well with therapy, states he has been in rehab before and understands how tiring it can be. Encouraged pt to eat well and accept supplements to help support rehab participation and wound healing.  Pt continues on diuresis medications, lasix  and spironolactone . Recommend continuing to monitor potassium levels as lasix  can lead to hypokalemia.   Weight continues to trend down with diuresis. Will continue to monitor. Pt's nutrition focused physical exam shows moderate muscle depletions which may be related to decreased activity since BKA and inadequate protein intake.   Average Meal Completion: 1/29: 25% average intake x 1 recorded meals  Nutritionally Relevant Medications: Risaquad TID Vitamin C  500 mg daily Ferrous gluconate  daily Lasix   Juven BID Prednisone   Spironolactone    Labs reviewed: Potassium 3.6<--4.3 CBG x 24 h: 72-87 mg/dL No recent J8r  Admit weight: 81.5 kg Current weight: 79.3 kg   NUTRITION - FOCUSED PHYSICAL EXAM:  Flowsheet Row Most Recent Value  Orbital Region Mild depletion  Upper Arm Region No depletion  Thoracic and Lumbar Region No depletion  Buccal Region No depletion  Temple Region Mild depletion  Clavicle Bone Region Moderate depletion  Clavicle and Acromion Bone Region Moderate depletion  Scapular Bone Region  Moderate depletion  Dorsal Hand Moderate depletion  Patellar Region Moderate depletion  Anterior Thigh Region Moderate depletion  Posterior Calf Region Moderate depletion  Edema (RD Assessment) None  Hair Reviewed  Eyes Reviewed  Mouth Reviewed  Skin Reviewed  [ecchymosis on  extremities]  Nails Reviewed    Diet Order:   Diet Order             Diet regular Room service appropriate? Yes with Assist; Fluid consistency: Thin  Diet effective now                   EDUCATION NEEDS:   Education needs have been addressed  Skin:  Skin Assessment: Skin Integrity Issues: Skin Integrity Issues:: Incisions, Other (Comment) Incisions: R BKA Other: wounds: L leg  Last BM:  1/29 type 5  Height:   Ht Readings from Last 1 Encounters:  06/24/24 6' 0.99 (1.854 m)    Weight:   Wt Readings from Last 1 Encounters:  06/24/24 79.3 kg    Ideal Body Weight:  78.7 kg  BMI:  Body mass index is 23.08 kg/m.  Estimated Nutritional Needs:   Kcal:  1800-2000  Protein:  95-110  Fluid:  1.8-2L    Josette Glance, MS, RDN, LDN Clinical Dietitian I Please reach out via secure chat

## 2024-06-24 NOTE — Evaluation (Signed)
 Physical Therapy Assessment and Plan  Patient Details  Name: Levi [name] MRN: [id number] Date of Birth: [birth year]  PT Diagnosis: Abnormal posture, Abnormality of gait, Difficulty walking, Edema, Impaired sensation, Muscle weakness, and Pain in R shoulder Rehab Potential: Good ELOS: 10-12 days   Today's Date: 06/24/2024 PT Individual Time: 0730-0826 PT Individual Time Calculation (min): 56 min    Hospital Problem: Principal Problem:   Debility   Past Medical History:  Past Medical History:  Diagnosis Date   Agent orange exposure 1970's   takes Imdur ,Diltiazem , and Atacand  daily   Agent orange exposure    ALLERGIC RHINITIS    Allergy     seasonal allergies   Anemia    iron  deficiency   Arthritis    Asthma    uses inhaler and nebulizers   Atrial fibrillation (HCC)    Bruises easily    d/t prednisone  daily   Cataract    Cellulitis 03/22/2020   Chest wall pain 09/21/2019   L scapula, reported 4/27 21/     CHF (congestive heart failure) (HCC)    dx-on meds to treat   Chronic bronchitis (HCC)    used to get it q yr; last time ?2008 (04/28/2012)   Clotting disorder 2019   On blood thinner when I bleed it is for a consideral amount of time. This is tegardless of size of wound.   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 2010   History of total  right hip arthroplasty 07/31/2015   Hyperlipidemia    takes Lipitor daily; pt. states he takes a preventive   Hypothyroidism    Joint pain    Joint swelling    Leukocytosis    Lumbar stenosis status post T12-S1 fusion 04/07/2017   MSSA bacteremia 03/19/2023   Nausea and vomiting 11/12/2022   Neuromuscular disorder (HCC)    bilat legs, and bilat arms   Neuropathy    Osteoarthritis of left knee 01/10/2012   Osteoporosis 2012   PAF (paroxysmal atrial fibrillation) (HCC)    a. Dx 04/2012, CHA2DS2VASc = 1 (age);  b. 04/2012 Echo: EF 40-50%, mild MR.   Personal history of colonic adenomas 02/09/2013   Pneumonia    Primary osteoarthritis of right knee 06/17/2014   Seizures (HCC) 06/20/2024   Sepsis (HCC) 03/18/2023   Shortness of breath dyspnea    Thyroid  disease    no taking meds-caused dizziness   Past Surgical History:  Past Surgical History:  Procedure Laterality Date   24 HOUR PH STUDY N/A 05/30/2021   Procedure: 24 HOUR PH STUDY;  Surgeon: San Sandor GAILS, DO;  Location: [facility];  Service: Gastroenterology;  Laterality: N/A;   AMPUTATION Right 11/14/2023   Procedure: AMPUTATION BELOW  KNEE;  Surgeon: Harden Jerona GAILS, MD;  Location: [facility] OR;  Service: Orthopedics;  Laterality: Right;   AMPUTATION TOE Bilateral 08/13/2023   Procedure: LEFT SECOND TOE AMPUTATION AND RIGHT FIFTH TOE AMPUTATION;  Surgeon: Malvin Marsa FALCON, DPM;  Location: [facility] OR;  Service: Orthopedics/Podiatry;  Laterality: Bilateral;   ANTERIOR CERVICAL DECOMP/DISCECTOMY FUSION N/A 07/21/2012   Procedure: ANTERIOR CERVICAL DECOMPRESSION/DISCECTOMY FUSION 2 LEVELS;  Surgeon: Victory Gens, MD;  Location: [facility];  Service: Neurosurgery;  Laterality: N/A;  C4-5 C5-6 Anterior cervical decompression/diskectomy/fusion   ANTERIOR LAT LUMBAR FUSION N/A 04/07/2017   Procedure: Thoracic twelve-Lumbar one Anterolateral decompression/fusion;  Surgeon: Gens Victory, MD;  Location: [facility] OR;  Service: Neurosurgery;   Laterality: N/A;   ARTHRODESIS FOOT WITH WEIL OSTEOTOMY Left 11/07/2022   Procedure: LEFT THIRD METATARSOPHALANGEAL CAPSULOTOMY, WEIL OSTEOTOMY;  Surgeon: Elsa Lonni SAUNDERS, MD;  Location: [facility] OR;  Service: Orthopedics;  Laterality: Left;  LENGTH OF SURGERY: 60 MINUTES   BACK SURGERY     x 3   BELOW KNEE LEG AMPUTATION Right    June 2025   BONE EXOSTOSIS EXCISION Right 10/16/2023   Procedure: EXCISION, EXOSTOSIS;  Surgeon: Janit Thresa HERO, DPM;  Location: [facility];  Service: Orthopedics/Podiatry;  Laterality: Right;   CARDIAC CATHETERIZATION  11/2008   Dr. Jeffrie - 20% calcified non flow limiting left main, 50% EF apical hypokinesis   CARPAL TUNNEL RELEASE Bilateral    CERVICAL DISC ARTHROPLASTY N/A 04/07/2017   Procedure: Cervical six-seven Disc arthroplasty;  Surgeon: Gens Victory, MD;  Location: [facility] OR;  Service: Neurosurgery;  Laterality: N/A;   CHEST TUBE INSERTION Left 04/11/2017   Procedure: CHEST TUBE INSERTION;  Surgeon: Fleeta Hanford Coy, MD;  Location[facility] OR;  Service: Thoracic;  Laterality: Left;   CHOLECYSTECTOMY     COLONOSCOPY  2016   CG-MAC-miralax (good)servere TICS/TA x 2   ESOPHAGEAL MANOMETRY N/A 05/30/2021   Procedure: ESOPHAGEAL MANOMETRY (EM);  Surgeon: San Sandor GAILS, DO;  Location: [facility];  Service: Gastroenterology;  Laterality: N/A;  ph impedence   ESOPHAGOGASTRODUODENOSCOPY     EYE SURGERY Bilateral 2011   steroidal encapsulation (04/28/2012)   FOOT SURGERY Right    x 2   JOINT REPLACEMENT     Many surgeries including 5 joit replacements   KNEE ARTHROSCOPY Bilateral    LATERAL / POSTERIOR COMBINED FUSION LUMBAR SPINE  2012   LEFT ATRIAL APPENDAGE OCCLUSION N/A 02/22/2021   Procedure: LEFT ATRIAL APPENDAGE OCCLUSION;  Surgeon: Cindie Ole DASEN, MD;  [facility];  Service: Cardiovascular;  Laterality: N/A;   NEUROPLASTY / TRANSPOSITION ULNAR NERVE AT ELBOW Bilateral    POLYPECTOMY  2016   severe TICS/TA x 2   POSTERIOR CERVICAL  LAMINECTOMY WITH MET- RX Right 06/11/2021   Procedure: Right Cervical six-seven  Laminectomy and foraminotomy with metrex;  Surgeon: Gens Victory, MD;  Location: [facility];  Service: Neurosurgery;  Laterality: Right;   REFRACTIVE SURGERY Left 07/07/2023   laser surgery but still has floaters   REVERSE SHOULDER ARTHROPLASTY  04/28/2012   Procedure: REVERSE SHOULDER ARTHROPLASTY;  Surgeon: Eva Elsie Herring, MD;  Location: [facility] OR;  Service: Orthopedics;  Laterality: Left;  Left shouder reverse total shoulder arthroplasty   SHOULDER SURGERY     SPINE SURGERY     I have had 7 spinal surgeries   TEE WITHOUT CARDIOVERSION N/A 02/22/2021   Procedure: TRANSESOPHAGEAL ECHOCARDIOGRAM (TEE);  Surgeon: Cindie Ole DASEN, MD;  Location: [facility] INVASIVE CV LAB;  Service: Cardiovascular;  Laterality: N/A;   TENDON  REPAIR Right 08/07/2020   Procedure: RIGHT HAND LIGAMENT RECONSTRUCTION AND TENDON INTERPOSITION;  Surgeon: Yvone Rush, MD;  Location: [facility];  Service: Orthopedics;  Laterality: Right;   TONSILLECTOMY AND ADENOIDECTOMY  1954   TOTAL HIP ARTHROPLASTY Left 2011   left (04/28/2012)   TOTAL HIP ARTHROPLASTY Right 07/31/2015   Procedure: TOTAL HIP ARTHROPLASTY ANTERIOR APPROACH;  Surgeon: Rush Yvone, MD;  Location: [facility];  Service: Orthopedics;  Laterality: Right;   TOTAL KNEE ARTHROPLASTY  01/10/2012   Procedure: TOTAL KNEE ARTHROPLASTY;  Surgeon: Rush LITTIE Yvone, MD;  Location: [facility];  Service: Orthopedics;;  left total knee arthroplasty   TOTAL KNEE ARTHROPLASTY Right 06/17/2014   Procedure: TOTAL KNEE ARTHROPLASTY;  Surgeon: Rush LITTIE Yvone, MD;  Location: [facility];  Service: Orthopedics;  Laterality: Right;   toupet fundolplication  11/01/2021   TRANSESOPHAGEAL ECHOCARDIOGRAM (CATH LAB) N/A 03/24/2023   Procedure: TRANSESOPHAGEAL ECHOCARDIOGRAM;  Surgeon: Mona Vinie BROCKS, MD;  [facility]: [facility];  Service: Cardiovascular;  Laterality: N/A;   TRANSESOPHAGEAL  ECHOCARDIOGRAM (CATH LAB) N/A 02/25/2024   Procedure: TRANSESOPHAGEAL ECHOCARDIOGRAM;  Surgeon: Jeffrie Oneil BROCKS, MD;  [facility]: [facility];  Service: Cardiovascular;  Laterality: N/A;    Assessment & Plan Clinical Impression: Patient is a 79 y.o. year old male with history of PAF, agent orange exposure, COPD/Asthma--steroid dependent, PAF, seizures, DDD s/p lumbar and cervical decompression, hx of sacral decub, H/o MRSA bacteremia and cellulitis/abscess buttock 01/2024 (Dr. Overton), osteo myelitis s/p R-BKA and chronic BLE wounds followed by wound care, recent issues with cough secondary to RLL PNA started on doxycyline 06/17/24 at urgent care but continued to have progressive SOB with cough and hypoxia. He was admitted on 06/19/24 for work up and started on IV Rocephin /azithromycin . CTA chest negative for PE and showed small bilateral pleural effusions with bibasilar atelectasis and incidental findings of compression deformity T6 w/ 40% loss of height and T7 with 60% loss of height--possibly acute. Symptoms felt to be due to acute exacerbation of heart failure and he was placed on lasix  for diuresis and antibiotics d/c. Echo with EF 45 to 50%, mild Mitral valve regurgitation. Respiratory status has improved with weight down from 195 to 179 lbs. Hypokalemia resolved with potassium supplement. PT/OT consulted and patient noted to require min to mod assist for scoot transfers and up to max assist with LB dressing. He is limited by debility and CIR recommended due to functional decline.   Patient currently requires mod with mobility secondary to muscle weakness, decreased cardiorespiratoy endurance, and decreased standing balance, decreased postural control, and decreased balance strategies.  Prior to hospitalization, patient was supervision with mobility and lived with Spouse in a House home.  Home access is  Ramped entrance.  Patient will benefit from skilled PT intervention to maximize safe functional  mobility, minimize fall risk, and decrease caregiver burden for planned discharge home with 24 hour supervision.  Anticipate patient will benefit from follow up HH at discharge.  PT - End of Session Activity Tolerance: Tolerates 30+ min activity without fatigue Endurance Deficit: Yes Endurance Deficit Description: grossly deconditioned PT Assessment Rehab Potential (ACUTE/IP ONLY): Good PT Barriers to Discharge: Wound Care;Other (comments) PT Barriers to Discharge Comments: pain in R shoudler, wounds to BLE and buttocks, wife limited in amount of physical assist able to provide, inability to wear prosthetic, weakness/deconditioning PT Patient demonstrates impairments in the following area(s): Balance;Edema;Endurance;Motor;Nutrition;Pain;Sensory;Skin Integrity PT Transfers Functional Problem(s): Bed Mobility;Bed to Chair;Car PT Locomotion Functional Problem(s): Ambulation;Wheelchair Mobility PT  Plan PT Intensity: Minimum of 1-2 x/day ,45 to 90 minutes PT Frequency: 5 out of 7 days PT Duration Estimated Length of Stay: 10-12 days PT Treatment/Interventions: Ambulation/gait training;DME/adaptive equipment instruction;Psychosocial support;UE/LE Strength taining/ROM;Balance/vestibular training;UE/LE Coordination activities;Skin care/wound management;Functional mobility training;Splinting/orthotics;Visual/perceptual remediation/compensation;Community reintegration;Neuromuscular re-education;Wheelchair propulsion/positioning;Stair training;Discharge planning;Pain management;Therapeutic Activities;Disease management/prevention;Patient/family education;Therapeutic Exercise PT Transfers Anticipated Outcome(s): supervision with LRAD PT Locomotion Anticipated Outcome(s): min A with LRAD PT Recommendation Follow Up Recommendations: Home health PT Patient destination: Home Equipment Recommended: To be determined   PT Evaluation Precautions/Restrictions Precautions Precautions:  Fall Precaution/Restrictions Comments: acute compression deformities T6-T7; h/o R BKA with wounds on distal aspect of limb Restrictions Weight Bearing Restrictions Per Provider Order: Yes RLE Weight Bearing Per Provider Order: Non weight bearing Pain Interference Pain Interference Pain Effect on Sleep: 2. Occasionally Pain Interference with Therapy Activities: 2. Occasionally Pain Interference with Day-to-Day Activities: 2. Occasionally Home Living/Prior Functioning Home Living Available Help at Discharge: Family;Available 24 hours/day Type of Home: House Home Access: Ramped entrance Home Layout: Able to live on main level with bedroom/bathroom Bathroom Shower/Tub: Health Visitor: Handicapped height Bathroom Accessibility: Yes Additional Comments: hasn't been wearing prosthetic for past 6 weeks due to wounds in distal aspect of resiudal limb. Hasn't been standing in past 6 weeks, mostly transferring via lateral scoot - sometimes able to do independently and sometimes needed assist from wife  Lives With: Spouse Prior Function Level of Independence: Independent with transfers;Needs assistance with ADLs;Needs assistance with tranfers  Able to Take Stairs?: No Driving: No Vision/Perception  Vision - History Ability to See in Adequate Light: 0 Adequate Perception Perception: Not tested Praxis Praxis: Not tested  Cognition Overall Cognitive Status: Within Functional Limits for tasks assessed Arousal/Alertness: Awake/alert Orientation Level: Oriented X4 Memory: Appears intact Awareness: Appears intact Problem Solving: Appears intact Safety/Judgment: Appears intact Comments: pleasant and motivated Sensation Sensation Light Touch: Impaired Detail Light Touch Impaired Details: Impaired RLE;Impaired LLE Hot/Cold: Not tested Proprioception: Impaired by gross assessment Stereognosis: Not tested Additional Comments: pt reports numbness/tingling/burning more on  RLE>LLE, hx of neuropathy, and decreased sensation from L3 dermatomes and below on LLE>RLE. Absent sensation along L great toe. Coordination Gross Motor Movements are Fluid and Coordinated: No Fine Motor Movements are Fluid and Coordinated: Not tested Coordination and Movement Description: grossly uncoordinated due to neuropathy, weakness/deconditioning, pain in R shoulder, wounds on contralateral limb, and altered balance strategies due to inability to wear R prosthetic Motor  Motor Motor: Other (comment) Motor - Skilled Clinical Observations: neuropathy, weakness/deconditioning, pain in R shoulder, wounds on contralateral limb, and altered balance strategies due to inability to wear R prosthetic  Trunk/Postural Assessment  Cervical Assessment Cervical Assessment: Exceptions to [facility] (mild forward head) Thoracic Assessment Thoracic Assessment: Exceptions to [facility] (rounded shoulders) Lumbar Assessment Lumbar Assessment: Exceptions to [facility] (posterior pelvic tilt (flexible)) Postural Control Postural Control: Deficits on evaluation Trunk Control: very mild posterior bias in sitting Righting Reactions: delayed on R Protective Responses: delayed on R  Balance Balance Balance Assessed: Yes Static Sitting Balance Static Sitting - Balance Support: Bilateral upper extremity supported;Feet supported Static Sitting - Level of Assistance: 5: Stand by assistance (supervision) Dynamic Sitting Balance Dynamic Sitting - Balance Support: Right upper extremity supported;Feet supported Dynamic Sitting - Level of Assistance: 5: Stand by assistance (supervision) Extremity Assessment  RLE Assessment RLE Assessment: Exceptions to [facility] General Strength Comments: tested sitting EOB RLE Strength Right Hip Flexion: 3+/5 Right Hip ABduction: 3+/5 Right Hip ADduction: 3+/5 Right Knee Flexion: 3+/5 Right Knee Extension: 3+/5 LLE Assessment LLE  Assessment: Exceptions to [facility] General Strength Comments: tested  sitting EOB LLE Strength Left Hip Flexion: 3+/5 Left Hip ABduction: 3+/5 Left Hip ADduction: 3+/5 Left Knee Flexion: 3+/5 Left Knee Extension: 3/5 Left Ankle Dorsiflexion: 3+/5 Left Ankle Plantar Flexion: 3+/5  Care Tool Care Tool Bed Mobility Roll left and right activity   Roll left and right assist level: Minimal Assistance - Patient > 75%    Sit to lying activity        Lying to sitting on side of bed activity   Lying to sitting on side of bed assist level: the ability to move from lying on the back to sitting on the side of the bed with no back support.: Moderate Assistance - Patient 50 - 74%     Care Tool Transfers Sit to stand transfer Sit to stand activity did not occur: Safety/medical concerns (weakness, deconditioning, condition of contralateral leg)      Chair/bed transfer   Chair/bed transfer assist level: Moderate Assistance - Patient 50 - 74% (slideboard)    Licensed Conveyancer transfer activity did not occur: Safety/medical concerns (weakness, deconditioning)        Care Tool Locomotion Ambulation Ambulation activity did not occur: Safety/medical concerns (weakness, deconditioning, condition of contralateral leg)        Walk 10 feet activity Walk 10 feet activity did not occur: Safety/medical concerns (weakness, deconditioning, condition of contralateral leg)       Walk 50 feet with 2 turns activity Walk 50 feet with 2 turns activity did not occur: Safety/medical concerns (weakness, deconditioning, condition of contralateral leg)      Walk 150 feet activity Walk 150 feet activity did not occur: Safety/medical concerns (weakness, deconditioning, condition of contralateral leg)      Walk 10 feet on uneven surfaces activity Walk 10 feet on uneven surfaces activity did not occur: Safety/medical concerns (weakness, deconditioning, condition of contralateral leg)      Stairs Stair activity did not occur: Safety/medical concerns (weakness, deconditioning,  condition of contralateral leg)        Walk up/down 1 step activity Walk up/down 1 step or curb (drop down) activity did not occur: Safety/medical concerns (weakness, deconditioning, condition of contralateral leg)      Walk up/down 4 steps activity Walk up/down 4 steps activity did not occur: Safety/medical concerns (weakness, deconditioning, condition of contralateral leg)      Walk up/down 12 steps activity Walk up/down 12 steps activity did not occur: Safety/medical concerns (weakness, deconditioning, condition of contralateral leg)      Pick up small objects from floor Pick up small object from the floor (from standing position) activity did not occur: Safety/medical concerns (weakness, deconditioning, condition of contralateral leg)      Wheelchair Is the patient using a wheelchair?: Yes Type of Wheelchair: Manual Wheelchair activity did not occur: Safety/medical concerns (weakness, deconditioning)      Wheel 50 feet with 2 turns activity Wheelchair 50 feet with 2 turns activity did not occur: Safety/medical concerns (weakness, deconditioning)    Wheel 150 feet activity Wheelchair 150 feet activity did not occur: Safety/medical concerns (weakness, deconditioning)      Refer to Care Plan for Long Term Goals  SHORT TERM GOAL WEEK 1 PT Short Term Goal 1 (Week 1): pt will perform bed mobility with CGA overall PT Short Term Goal 2 (Week 1): pt will transfer bed<>chair with LRAD and CGA PT Short Term Goal 3 (Week 1): pt will perform sit<>stand with LRAD and mod A  Recommendations  for other services: None   Skilled Therapeutic Intervention Evaluation completed (see details above and below) with education on PT POC and goals and individual treatment initiated with focus on functional mobility/transfers, dressing, and generalized strengthening and endurance. Received pt semi-reclined in bed, pt educated on PT evaluation, CIR policies, and therapy schedule and agreeable. Pt reported  pain 5/10 in R shoulder from RTC (declined any pain medication). Provided pt with 18x18 manual WC, slideboard, R amputee support pad, L elevating legrest, and Roho cushion for improved skin integrity. Donned shirt in supine with max A and donned pants in supine with max A. Rolled L/R with min A using bedrails and required max A to pull pants over hips. Pt transferred semi-reclined<>sitting L EOB from flat bed and using bedrails with light mod A for trunk control. Donned L sock and shoe with max A and performed slideboard transfer into WC to R with light mod A. Sat in WC at sink and brushed teeth/washed face with setup assist. Pt reports his biggest goal is proper fitting for prosthetic and being able to stand with it - called wife to bring in prosthetic and discussed having [facility] come to see pt while here to make adjustments. Also discussed possibility of custom WC for improved mobility and independence at home. Concluded session with pt sitting in [facility] with all needs within reach. Safety plan updated.   Mobility Bed Mobility Bed Mobility: Rolling Right;Rolling Left;Supine to Sit Rolling Right: Minimal Assistance - Patient > 75% Rolling Left: Minimal Assistance - Patient > 75% Supine to Sit: Moderate Assistance - Patient 50-74% Transfers Transfers: Lateral/Scoot Transfers Lateral/Scoot Transfers: Moderate Assistance - Patient 50-74% Transfer (Assistive device): Other (Comment) (slideboard) Locomotion  Gait Ambulation: No Gait Gait: No Stairs / Additional Locomotion Stairs: No Wheelchair Mobility Wheelchair Mobility: No   Discharge Criteria: Patient will be discharged from PT if patient refuses treatment 3 consecutive times without medical reason, if treatment goals not met, if there is a change in medical status, if patient makes no progress towards goals or if patient is discharged from hospital.  The above assessment, treatment plan, treatment alternatives and goals were discussed and  mutually agreed upon: by patient  Ashlay Altieri M Zaunegger Genette Huertas Zaunegger PT, DPT 06/24/2024, 8:59 AM

## 2024-06-24 NOTE — Progress Notes (Addendum)
 Occupational Therapy Assessment and Plan  Patient Details  Name: Levi [name] MRN: [id number] Date of Birth: 1946-01-01  OT Diagnosis: abnormal posture, acute pain, muscle weakness (generalized), and pain in joint Rehab Potential: Rehab Potential (ACUTE ONLY): Good ELOS: 10-12 days   Today's Date: 06/24/2024 OT Individual Time: 1003-1100 OT Individual Time Calculation (min): 57 min     Hospital Problem: Principal Problem:   Debility   Past Medical History:  Past Medical History:  Diagnosis Date   Agent orange exposure 1970's   takes Imdur ,Diltiazem , and Atacand  daily   Agent orange exposure    ALLERGIC RHINITIS    Allergy     seasonal allergies   Anemia    iron  deficiency   Arthritis    Asthma    uses inhaler and nebulizers   Atrial fibrillation (HCC)    Bruises easily    d/t prednisone  daily   Cataract    Cellulitis 03/22/2020   Chest wall pain 09/21/2019   L scapula, reported 4/27 21/     CHF (congestive heart failure) (HCC)    dx-on meds to treat   Chronic bronchitis (HCC)    used to get it q yr; last time ?2008 (04/28/2012)   Clotting disorder 2019   On blood thinner when I bleed it is for a consideral amount of time. This is tegardless of size of wound.   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 2010   History of total right hip  arthroplasty 07/31/2015   Hyperlipidemia    takes Lipitor daily; pt. states he takes a preventive   Hypothyroidism    Joint pain    Joint swelling    Leukocytosis    Lumbar stenosis status post T12-S1 fusion 04/07/2017   MSSA bacteremia 03/19/2023   Nausea and vomiting 11/12/2022   Neuromuscular disorder (HCC)    bilat legs, and bilat arms   Neuropathy    Osteoarthritis of left knee 01/10/2012   Osteoporosis 2012   PAF (paroxysmal atrial fibrillation) (HCC)    a. Dx 04/2012, CHA2DS2VASc = 1 (age);  b. 04/2012 Echo: EF 40-50%, mild MR.   Personal history of colonic adenomas 02/09/2013   Pneumonia    Primary osteoarthritis of right knee 06/17/2014   Seizures (HCC) 06/20/2024   Sepsis (HCC) 03/18/2023   Shortness of breath dyspnea    Thyroid  disease    no taking meds-caused dizziness   Past Surgical History:  Past Surgical History:  Procedure Laterality Date   89 HOUR PH STUDY N/A 05/30/2021   Procedure: 24 HOUR PH STUDY;  Surgeon: San Sandor GAILS, DO;  Location: [facility];  Service: Gastroenterology;  Laterality: N/A;   AMPUTATION Right 11/14/2023   Procedure: AMPUTATION BELOW KNEE;  Surgeon: Harden Jerona GAILS, MD;  Location: [facility] OR;  Service: Orthopedics;  Laterality: Right;   AMPUTATION TOE Bilateral 08/13/2023   Procedure: LEFT SECOND TOE AMPUTATION AND RIGHT FIFTH TOE AMPUTATION;  Surgeon: Malvin Marsa FALCON, DPM;  Location: [facility];  Service: Orthopedics/Podiatry;  Laterality: Bilateral;   ANTERIOR CERVICAL DECOMP/DISCECTOMY FUSION N/A 07/21/2012   Procedure: ANTERIOR CERVICAL DECOMPRESSION/DISCECTOMY FUSION 2 LEVELS;  Surgeon: Victory Gens, MD;  Location: [facility];  Service: Neurosurgery;  Laterality: N/A;  C4-5 C5-6 Anterior cervical decompression/diskectomy/fusion   ANTERIOR LAT LUMBAR FUSION N/A 04/07/2017   Procedure: Thoracic twelve-Lumbar one Anterolateral decompression/fusion;  Surgeon: Gens Victory, MD;  Location: [facility];  Service: Neurosurgery;  Laterality:  N/A;   ARTHRODESIS FOOT WITH WEIL OSTEOTOMY Left 11/07/2022   Procedure: LEFT THIRD METATARSOPHALANGEAL CAPSULOTOMY, WEIL OSTEOTOMY;  Surgeon: Elsa Lonni SAUNDERS, MD;  Location: [facility] OR;  Service: Orthopedics;  Laterality: Left;  LENGTH OF SURGERY: 60 MINUTES   BACK SURGERY     x 3   BELOW KNEE LEG AMPUTATION Right    June 2025   BONE EXOSTOSIS EXCISION Right 10/16/2023   Procedure: EXCISION, EXOSTOSIS;  Surgeon: Janit Thresa HERO, DPM;  Location: [facility];  Service: Orthopedics/Podiatry;  Laterality: Right;   CARDIAC CATHETERIZATION  11/2008   Dr. Jeffrie - 20% calcified non flow limiting left main, 50% EF apical hypokinesis   CARPAL TUNNEL RELEASE Bilateral    CERVICAL DISC ARTHROPLASTY N/A 04/07/2017   Procedure: Cervical six-seven Disc arthroplasty;  Surgeon: Gens Victory, MD;  Location: [facility] OR;  Service: Neurosurgery;  Laterality: N/A;   CHEST TUBE INSERTION Left 04/11/2017   Procedure: CHEST TUBE INSERTION;  Surgeon: Fleeta Hanford Coy, MD;  Location: [facility] OR;  Service: Thoracic;  Laterality: Left;   CHOLECYSTECTOMY     COLONOSCOPY  2016   CG-MAC-miralax (good)servere TICS/TA x 2   ESOPHAGEAL MANOMETRY N/A 05/30/2021   Procedure: ESOPHAGEAL MANOMETRY (EM);  Surgeon: San Sandor GAILS, DO;  Location: [facility];  Service: Gastroenterology;  Laterality: N/A;  ph impedence   ESOPHAGOGASTRODUODENOSCOPY     EYE SURGERY Bilateral 2011   steroidal encapsulation (04/28/2012)   FOOT SURGERY Right    x 2   JOINT REPLACEMENT     Many surgeries including 5 joit replacements   KNEE ARTHROSCOPY Bilateral    LATERAL / POSTERIOR COMBINED FUSION LUMBAR SPINE  2012   LEFT ATRIAL APPENDAGE OCCLUSION N/A 02/22/2021   Procedure: LEFT ATRIAL APPENDAGE OCCLUSION;  Surgeon: Cindie Ole DASEN, MD;  [facility];  Service: Cardiovascular;  Laterality: N/A;   NEUROPLASTY / TRANSPOSITION ULNAR NERVE AT ELBOW Bilateral    POLYPECTOMY  2016   severe TICS/TA x 2   POSTERIOR CERVICAL LAMINECTOMY  WITH MET- RX Right 06/11/2021   Procedure: Right Cervical six-seven  Laminectomy and foraminotomy with metrex;  Surgeon: Gens Victory, MD;  [facility];  Service: Neurosurgery;  Laterality: Right;   REFRACTIVE SURGERY Left 07/07/2023   laser surgery but still has floaters   REVERSE SHOULDER ARTHROPLASTY  04/28/2012   Procedure: REVERSE SHOULDER ARTHROPLASTY;  Surgeon: Eva Elsie Herring, MD;  Location: [facility] OR;  Service: Orthopedics;  Laterality: Left;  Left shouder reverse total shoulder arthroplasty   SHOULDER SURGERY     SPINE SURGERY     I have had 7 spinal surgeries   TEE WITHOUT CARDIOVERSION N/A 02/22/2021   Procedure: TRANSESOPHAGEAL ECHOCARDIOGRAM (TEE);  Surgeon: Cindie Ole DASEN, MD;  Location: [facility] CV LAB;  Service: Cardiovascular;  Laterality: N/A;   TENDON REPAIR Right  08/07/2020   Procedure: RIGHT HAND LIGAMENT RECONSTRUCTION AND TENDON INTERPOSITION;  Surgeon: Yvone Rush, MD;  Location: [facility];  Service: Orthopedics;  Laterality: Right;   TONSILLECTOMY AND ADENOIDECTOMY  1954   TOTAL HIP ARTHROPLASTY Left 2011   left (04/28/2012)   TOTAL HIP ARTHROPLASTY Right 07/31/2015   Procedure: TOTAL HIP ARTHROPLASTY ANTERIOR APPROACH;  Surgeon: Rush Yvone, MD;  [facility];  Service: Orthopedics;  Laterality: Right;   TOTAL KNEE ARTHROPLASTY  01/10/2012   Procedure: TOTAL KNEE ARTHROPLASTY;  Surgeon: Rush LITTIE Yvone, MD;  [facility];  Service: Orthopedics;;  left total knee arthroplasty   TOTAL KNEE ARTHROPLASTY Right 06/17/2014   Procedure: TOTAL KNEE ARTHROPLASTY;  Surgeon: Rush LITTIE Yvone, MD;  [facility];  Service: Orthopedics;  Laterality: Right;   toupet fundolplication  11/01/2021   TRANSESOPHAGEAL ECHOCARDIOGRAM (CATH LAB) N/A 03/24/2023   Procedure: TRANSESOPHAGEAL ECHOCARDIOGRAM;  Surgeon: Mona Vinie BROCKS, MD;  [facility];  Service: Cardiovascular;  Laterality: N/A;   TRANSESOPHAGEAL ECHOCARDIOGRAM (CATH  LAB) N/A 02/25/2024   Procedure: TRANSESOPHAGEAL ECHOCARDIOGRAM;  Surgeon: Jeffrie Oneil BROCKS, MD;  [facility];  Service: Cardiovascular;  Laterality: N/A;    Assessment & Plan Clinical Impression: Levi [name] is a 79 year old male with history of PAF, agent orange exposure, COPD/Asthma--steroid dependent, PAF, seizures, DDD s/p lumbar and cervical decompression, hx of sacral decub, H/o MRSA bacteremia and cellulitis/abscess buttock 01/2024 (Dr. Overton), osteo myelitis s/p R-BKA and chronic BLE wounds followed by wound care, recent issues with cough secondary to RLL PNA started on doxycyline 06/17/24 at urgent care but continued to have progressive SOB with cough and hypoxia. He was admitted on 06/19/24 for work up and started on IV Rocephin /azithromycin . CTA chest negative for PE and showed small bilateral pleural effusions with bibasilar atelectasis and incidental findings of compression deformity T6 w/ 40% loss of height and T7 with 60% loss of height--possibly acute. Symptoms felt to be due to acute exacerbation of heart failure and he was placed on lasix  for diuresis and antibiotics d/c. Echo with EF 45 to 50%, mild Mitral valve regurgitation. Respiratory status has improved with weight down from 195 to 179 lbs. Hypokalemia resolved with potassium supplement. PT/OT consulted and patient noted to require min to mod assist for scoot transfers and up to max assist with LB dressing. He is limited by debility and CIR recommended due to functional decline. Patient transferred to CIR on 06/23/2024 .    Patient currently requires mod-max with basic self-care skills secondary to muscle weakness, decreased cardiorespiratoy endurance, peripheral, and decreased sitting balance, decreased standing balance, decreased postural control, decreased balance strategies, and difficulty maintaining precautions.  Prior to hospitalization, patient could complete ADLs with min and transfers SUP-CGA.  Patient will  benefit from skilled intervention to decrease level of assist with basic self-care skills and increase independence with basic self-care skills prior to discharge home with care partner.  Anticipate patient will require 24 hour supervision and minimal physical assistance and follow up home health.  OT - End of Session Activity Tolerance: Decreased this session Endurance Deficit: Yes Endurance Deficit Description: grossly deconditioned OT Assessment Rehab Potential (ACUTE ONLY): Good OT Barriers to Discharge: None OT Patient demonstrates impairments in the following area(s): Balance;Sensory;Skin Integrity;Edema;Pain;Motor;Endurance OT Basic ADL's Functional Problem(s): Bathing;Dressing;Toileting OT Advanced ADL's Functional Problem(s): None OT Transfers Functional Problem(s): Toilet;Tub/Shower OT Additional Impairment(s): None OT Plan OT Intensity: Minimum of 1-2 x/day, 45 to 90 minutes OT Frequency: 5  out of 7 days OT Duration/Estimated Length of Stay: 10-12 days OT Treatment/Interventions: Balance/vestibular training;Disease mangement/prevention;Neuromuscular re-education;Self Care/advanced ADL retraining;Therapeutic Exercise;Wheelchair propulsion/positioning;DME/adaptive equipment instruction;Pain management;Skin care/wound managment;UE/LE Strength taining/ROM;Community reintegration;Patient/family education;Functional electrical stimulation;Splinting/orthotics;UE/LE Coordination activities;Discharge planning;Functional mobility training;Psychosocial support;Therapeutic Activities;Visual/perceptual remediation/compensation OT Self Feeding Anticipated Outcome(s): Set-up OT Basic Self-Care Anticipated Outcome(s): MIN A OT Toileting Anticipated Outcome(s): MIN A OT Bathroom Transfers Anticipated Outcome(s): CGA-MIN A OT Recommendation Recommendations for Other Services: Therapeutic Recreation consult Therapeutic Recreation Interventions: Pet therapy;Other (comment) (dance') Patient  destination: Home Follow Up Recommendations: Home health OT Equipment Recommended: To be determined   OT Evaluation Precautions/Restrictions  Precautions Precautions: Fall Recall of Precautions/Restrictions: Intact Precaution/Restrictions Comments: acute compression deformities T6-T7; h/o R BKA with wounds on distal aspect of limb Restrictions Weight Bearing Restrictions Per Provider Order: Yes RLE Weight Bearing Per Provider Order: Non weight bearing General Chart Reviewed: Yes Additional Pertinent History: PAF, agent orange exposure, COPD/Asthma--steroid dependent, PAF, seizures, DDD s/p lumbar and cervical decompression,  hx of sacral decub, H/o MRSA bacteremia and cellulitis/abscess buttock 01/2024 (Dr. Overton), osteo myelitis s/p R-BKA and chronic BLE wounds followed by wound care Family/Caregiver Present: No Vital Signs   Pain Pain Assessment Pain Score: 5  Pain Type: Chronic pain Pain Location: Shoulder Pain Orientation: Right Home Living/Prior Functioning Home Living Family/patient expects to be discharged to:: Private residence Living Arrangements: Spouse/significant other Available Help at Discharge: Family, Available 24 hours/day Type of Home: House Home Access: Ramped entrance Home Layout: Able to live on main level with bedroom/bathroom Bathroom Shower/Tub: Health Visitor: Handicapped height Bathroom Accessibility: Yes Additional Comments: hasn't been wearing prosthetic for past 6 weeks due to wounds in distal aspect of resiudal limb. Hasn't been standing in past 6 weeks, mostly transferring via lateral scoot - sometimes able to do independently and sometimes needed assist from wife  Lives With: Spouse IADL History Homemaking Responsibilities: Yes Meal Prep Responsibility: No Laundry Responsibility: No Cleaning Responsibility: No Bill Paying/Finance Responsibility: No Shopping Responsibility: No Child Care Responsibility: No Current License:  Yes Mode of Transportation: Set Designer Education: Probation Officer Leisure and Hobbies: used to run marathons Prior Function Level of Independence: Independent with transfers, Needs assistance with ADLs, Needs assistance with tranfers Bath: Minimal Dressing: Minimal Grooming: Supervision/set-up  Able to Take Stairs?: No Driving: No Vocation: Retired Administrator, Sports Baseline Vision/History: 0 No visual deficits (readers) Ability to See in Adequate Light: 0 Adequate Patient Visual Report: No change from baseline Vision Assessment?: No apparent visual deficits Perception  Perception: Not tested Praxis Praxis: Not tested Cognition Cognition Overall Cognitive Status: Within Functional Limits for tasks assessed (Pt reports mild age related cognitive deficits at baseline) Arousal/Alertness: Awake/alert Orientation Level: Person;Place;Situation Person: Oriented Place: Oriented Situation: Oriented Memory: Appears intact Awareness: Appears intact Problem Solving: Appears intact Safety/Judgment: Appears intact Comments: pleasant and motivated Brief Interview for Mental Status (BIMS) Repetition of Three Words (First Attempt): 3 Temporal Orientation: Year: Correct Temporal Orientation: Month: Accurate within 5 days Temporal Orientation: Day: Correct Recall: Sock: Yes, no cue required Recall: Blue: Yes, no cue required Recall: Bed: Yes, no cue required BIMS Summary Score: 15 Sensation Sensation Light Touch: Impaired Detail Light Touch Impaired Details: Impaired RLE;Impaired LLE Hot/Cold: Not tested Proprioception: Impaired by gross assessment Stereognosis: Not tested Additional Comments: pt reports numbness/tingling/burning more on RLE>LLE, hx of neuropathy, and decreased sensation from L3 dermatomes and below on LLE>RLE. Absent sensation along L great toe. Coordination Gross Motor Movements are Fluid and Coordinated: No Fine Motor Movements are Fluid and Coordinated: No Coordination  and Movement  Description: grossly uncoordinated due to neuropathy, weakness/deconditioning, pain in R shoulder, wounds on contralateral limb, and altered balance strategies due to inability to wear R prosthetic Finger Nose Finger Test: unable to compelte on R 2/2 shoulder injury; smooth equal movements on L Motor  Motor Motor: Other (comment) Motor - Skilled Clinical Observations: neuropathy, weakness/deconditioning, pain in R shoulder, wounds on contralateral limb, and altered balance strategies due to inability to wear R prosthetic  Trunk/Postural Assessment  Cervical Assessment Cervical Assessment: Exceptions to [facility] (mild forward head) Thoracic Assessment Thoracic Assessment: Exceptions to [facility] (rounded shoulders) Lumbar Assessment Lumbar Assessment: Exceptions to [facility] (posterior pelvic tilt) Postural Control Postural Control: Deficits on evaluation Trunk Control: very mild posterior bias in sitting Righting Reactions: delayed on R Protective Responses: delayed on R  Balance Balance Balance Assessed: Yes Static Sitting Balance Static Sitting - Balance Support: Bilateral upper extremity supported;Feet supported Static Sitting - Level of Assistance: 5: Stand by assistance Dynamic Sitting Balance Dynamic Sitting - Balance Support: Right upper extremity supported;Feet supported Dynamic Sitting - Level of Assistance: 5: Stand by assistance Extremity/Trunk Assessment RUE Assessment RUE Assessment: Exceptions to [facility] Passive Range of Motion (PROM) Comments: Decreased at shoulder 2/2 to rotator cuff tear General Strength Comments: grip 3+/5; shoudler/bicep/tricep 3-/5 LUE Assessment LUE Assessment: Within Functional Limits  Care Tool Care Tool Self Care Eating   Eating Assist Level: Set up assist    Oral Care    Oral Care Assist Level: Set up assist    Bathing   Body parts bathed by patient: Right arm;Left arm;Chest;Abdomen;Front perineal area;Face Body parts bathed by helper:  Buttocks;Right upper leg;Left upper leg;Left lower leg;Left arm   Assist Level: Maximal Assistance - Patient 24 - 49%    Upper Body Dressing(including orthotics)   What is the patient wearing?: Pull over shirt   Assist Level: Moderate Assistance - Patient 50 - 74%    Lower Body Dressing (excluding footwear)   What is the patient wearing?: Pants;Underwear/pull up Assist for lower body dressing: Maximal Assistance - Patient 25 - 49%    Putting on/Taking off footwear   What is the patient wearing?: Socks;Shoes Assist for footwear: Total Assistance - Patient < 25%       Care Tool Toileting Toileting activity   Assist for toileting: Total Assistance - Patient < 25%     Care Tool Bed Mobility Roll left and right activity   Roll left and right assist level: Minimal Assistance - Patient > 75%    Sit to lying activity        Lying to sitting on side of bed activity   Lying to sitting on side of bed assist level: the ability to move from lying on the back to sitting on the side of the bed with no back support.: Moderate Assistance - Patient 50 - 74%     Care Tool Transfers Sit to stand transfer Sit to stand activity did not occur: Safety/medical concerns (deconditioning)      Chair/bed transfer   Chair/bed transfer assist level: Moderate Assistance - Patient 50 - 74%     Toilet transfer   Assist Level: Moderate Assistance - Patient 50 - 74% (SB)     Care Tool Cognition  Expression of Ideas and Wants Expression of Ideas and Wants: 4. Without difficulty (complex and basic) - expresses complex messages without difficulty and with speech that is clear and easy to understand  Understanding Verbal and Non-Verbal Content Understanding Verbal and Non-Verbal Content: 4. Understands (complex and basic) -  clear comprehension without cues or repetitions   Memory/Recall Ability Memory/Recall Ability : Current season;Staff names and faces;That he or she is in a hospital/hospital unit    Refer to Care Plan for Long Term Goals  SHORT TERM GOAL WEEK 1 OT Short Term Goal 1 (Week 1): Pt will complete LB dressing with MOD A using AD PRN OT Short Term Goal 2 (Week 1): Pt will complete toileting with MOD A using AD/LRAD PRN OT Short Term Goal 3 (Week 1): Pt will maintain dynamic sitting balance during ADL with CGA OT Short Term Goal 4 (Week 1): Pt will complete toilet transfer with MIN A  Recommendations for other services: Therapeutic Recreation  Pet therapy and Other dance   Skilled Therapeutic Intervention Skilled Therapeutic Interventions/Progress Updates:  1:1 OT evaluation and intervention initiated with skilled education provided on OT role, goals, and POC. Pt received sitting up in wc presenting to be in good spirits receptive to skilled OT session reporting 5/10 pain in R shoulder- OT offering intermittent rest breaks, repositioning, and therapeutic support to optimize participation in therapy session. Pt completed ADLs at levels listed below this session, limited by weakness, pain, decreased balance, and decreased skin integrity. Following ADLs, focused remainder of session on SB transfer training with Pt able to complete 2 trials wc<>bari-BSC with MOD A +increased time for rest breaks and MOD verbal cues for technique. Pt would benefit from additional OT services in IPR setting. Pt was left resting in wc with call bell in reach and all needs met.   ADL ADL Equipment Provided: Reacher Eating: Set up Where Assessed-Eating: Chair Grooming: Supervision/safety Where Assessed-Grooming: Sitting at sink Upper Body Bathing: Moderate assistance Where Assessed-Upper Body Bathing: Wheelchair Lower Body Bathing: Maximal assistance Where Assessed-Lower Body Bathing: Wheelchair Upper Body Dressing: Moderate assistance Where Assessed-Upper Body Dressing: Wheelchair Lower Body Dressing: Maximal assistance Where Assessed-Lower Body Dressing: Bed level Toileting: Dependent;Maximal  assistance Where Assessed-Toileting: Bed level Toilet Transfer: Moderate assistance Toilet Transfer Method: Other (comment) (SB) Toilet Transfer Equipment: Extra wide bedside commode Tub/Shower Transfer: Not assessed Film/video Editor: Not assessed Mobility  Bed Mobility Bed Mobility: Rolling Right;Rolling Left;Supine to Sit Rolling Right: Minimal Assistance - Patient > 75% Rolling Left: Minimal Assistance - Patient > 75% Supine to Sit: Moderate Assistance - Patient 50-74%   Discharge Criteria: Patient will be discharged from OT if patient refuses treatment 3 consecutive times without medical reason, if treatment goals not met, if there is a change in medical status, if patient makes no progress towards goals or if patient is discharged from hospital.  The above assessment, treatment plan, treatment alternatives and goals were discussed and mutually agreed upon: by patient  [name] SHAUNNA Mines 06/24/2024, 12:07 PM

## 2024-06-24 NOTE — Plan of Care (Signed)
" °  Problem: Consults Goal: RH GENERAL PATIENT EDUCATION Description: See Patient Education module for education specifics. Outcome: Progressing   Problem: RH BOWEL ELIMINATION Goal: RH STG MANAGE BOWEL WITH ASSISTANCE Description: STG Manage Bowel with min Assistance. Outcome: Progressing   Problem: RH BLADDER ELIMINATION Goal: RH STG MANAGE BLADDER WITH ASSISTANCE Description: STG Manage Bladder With min Assistance Outcome: Progressing   Problem: RH SKIN INTEGRITY Goal: RH STG SKIN FREE OF INFECTION/BREAKDOWN Description: Manage skin free of infection/breakdown with min assistance Outcome: Progressing   Problem: RH SAFETY Goal: RH STG ADHERE TO SAFETY PRECAUTIONS W/ASSISTANCE/DEVICE Description: STG Adhere to Safety Precautions With Assistance/Device. Outcome: Progressing   Problem: RH PAIN MANAGEMENT Goal: RH STG PAIN MANAGED AT OR BELOW PT'S PAIN GOAL Description: <4 w/ prns Outcome: Progressing   Problem: RH KNOWLEDGE DEFICIT GENERAL Goal: RH STG INCREASE KNOWLEDGE OF SELF CARE AFTER HOSPITALIZATION Description: Manage increase knowledge of self care after hospitalization with min assistance from spouse using educational materials provided Outcome: Progressing   "

## 2024-06-25 DIAGNOSIS — I959 Hypotension, unspecified: Secondary | ICD-10-CM

## 2024-06-25 DIAGNOSIS — E876 Hypokalemia: Secondary | ICD-10-CM | POA: Diagnosis not present

## 2024-06-25 DIAGNOSIS — R5381 Other malaise: Secondary | ICD-10-CM | POA: Diagnosis not present

## 2024-06-25 DIAGNOSIS — D72829 Elevated white blood cell count, unspecified: Secondary | ICD-10-CM | POA: Diagnosis not present

## 2024-06-25 DIAGNOSIS — I5033 Acute on chronic diastolic (congestive) heart failure: Secondary | ICD-10-CM | POA: Diagnosis not present

## 2024-06-25 LAB — BASIC METABOLIC PANEL WITH GFR
Anion gap: 14 (ref 5–15)
BUN: 44 mg/dL — ABNORMAL HIGH (ref 8–23)
CO2: 20 mmol/L — ABNORMAL LOW (ref 22–32)
Calcium: 8.8 mg/dL — ABNORMAL LOW (ref 8.9–10.3)
Chloride: 102 mmol/L (ref 98–111)
Creatinine, Ser: 0.94 mg/dL (ref 0.61–1.24)
GFR, Estimated: >60 mL/min
Glucose, Bld: 132 mg/dL — ABNORMAL HIGH (ref 70–99)
Potassium: 3.6 mmol/L (ref 3.5–5.1)
Sodium: 136 mmol/L (ref 135–145)

## 2024-06-25 LAB — CBC
HCT: 35.9 % — ABNORMAL LOW (ref 39.0–52.0)
Hemoglobin: 11.3 g/dL — ABNORMAL LOW (ref 13.0–17.0)
MCH: 28.4 pg (ref 26.0–34.0)
MCHC: 31.5 g/dL (ref 30.0–36.0)
MCV: 90.2 fL (ref 80.0–100.0)
Platelets: 174 10*3/uL (ref 150–400)
RBC: 3.98 MIL/uL — ABNORMAL LOW (ref 4.22–5.81)
RDW: 16.5 % — ABNORMAL HIGH (ref 11.5–15.5)
WBC: 12.1 10*3/uL — ABNORMAL HIGH (ref 4.0–10.5)
nRBC: 0 % (ref 0.0–0.2)

## 2024-06-25 LAB — GLUCOSE, CAPILLARY: Glucose-Capillary: 73 mg/dL (ref 70–99)

## 2024-06-25 NOTE — Progress Notes (Addendum)
 "                                                        PROGRESS NOTE   Subjective/Complaints: No new complaints or concerns this morning.  Blood pressure little soft although patient asymptomatic.  ROS: Patient denies fever, new vision changes, dizziness, nausea, vomiting, diarrhea,  shortness of breath or chest pain, headache, or mood change.  Denies constipation Chronic right shoulder pain    Objective:   No results found. Recent Labs    06/24/24 0432  WBC 10.8*  HGB 11.0*  HCT 35.7*  PLT 165   Recent Labs    06/23/24 0253 06/24/24 0432  NA 140 138  K 4.3 3.6  CL 103 103  CO2 29 28  GLUCOSE 90 80  BUN 23 22  CREATININE 0.76 0.68  CALCIUM  8.6* 8.7*    Intake/Output Summary (Last 24 hours) at 06/25/2024 1223 Last data filed at 06/25/2024 0600 Gross per 24 hour  Intake 118 ml  Output 400 ml  Net -282 ml        Physical Exam: Vital Signs Blood pressure (!) 84/59, pulse 96, temperature 97.6 F (36.4 C), temperature source Oral, resp. rate 18, height 6' 0.99 (1.854 m), weight 81.3 kg, SpO2 94%.   General: No apparent distress, Frail, chronically ill appearing male. Sitting in WC-appears comfortable HEENT: Head is normocephalic, atraumatic, sclera anicteric, oral mucosa moist Neck: Supple without JVD or lymphadenopathy Heart: Reg rate and rhythm. No murmurs rubs or gallops Chest: mild rales at bases, non-labored Abdomen: Soft, non-tender, non-distended, bowel sounds positive. Extremities: R BKA, 1+ LLE edema  Psych: Pt's affect is appropriate. Pt is cooperative. Very pleasant Skin:  Right BKA with evidence of two small healed abrasions. Tibial tuberosity with pressure areas, petechiae right inner thigh. Open area (appears to have 1 cm depth) right lateral BKA site around fibular head-nail covered in dry dressing  Dry flaky paper thin skin with multiple ecchymotic areas and healing skin tears on four limbs. Hematomas on left shin anterior and posteriorly.  Buttocks red with small open area on top of gluteal cleft.   Neuro:     Mental Status: AAOx4 Speech/Languate: Naming and repetition intact, fluent, follows simple commands CRANIAL NERVES: 2-12 grossly intact     MOTOR: RUE: 1-2/5 Deltoid, 4/5 Biceps, 4/5 Triceps,4/5 Grip LUE: 4/5 Deltoid, 4/5 Biceps, 4/5 Triceps, 4/5 Grip RLE: HF 4/5, KE 4/5 LLE: HF 4/5, KE 4/5, ADF 2-3/5, APF 3/5   SENSORY: Altered to LT in b/l UE and LLE   MSK: decreased PROM R shoulder- chronic  Prior neuro assessment is c/w today's exam 06/25/2024.                 Assessment/Plan: 1. Functional deficits which require 3+ hours per day of interdisciplinary therapy in a comprehensive inpatient rehab setting. Physiatrist is providing close team supervision and 24 hour management of active medical problems listed below. Physiatrist and rehab team continue to assess barriers to discharge/monitor patient progress toward functional and medical goals  Care Tool:  Bathing    Body parts bathed by patient: Right arm, Left arm, Chest, Abdomen, Front perineal area, Face   Body parts bathed by helper: Buttocks, Right upper leg, Left upper leg, Left lower leg, Left arm     Bathing assist Assist Level:  Maximal Assistance - Patient 24 - 49%     Upper Body Dressing/Undressing Upper body dressing   What is the patient wearing?: Pull over shirt    Upper body assist Assist Level: Moderate Assistance - Patient 50 - 74%    Lower Body Dressing/Undressing Lower body dressing      What is the patient wearing?: Pants, Underwear/pull up     Lower body assist Assist for lower body dressing: Maximal Assistance - Patient 25 - 49%     Toileting Toileting    Toileting assist Assist for toileting: Total Assistance - Patient < 25%     Transfers Chair/bed transfer  Transfers assist     Chair/bed transfer assist level: Moderate Assistance - Patient 50 - 74%     Locomotion Ambulation   Ambulation  assist   Ambulation activity did not occur: Safety/medical concerns (weakness, deconditioning, condition of contralateral leg)          Walk 10 feet activity   Assist  Walk 10 feet activity did not occur: Safety/medical concerns (weakness, deconditioning, condition of contralateral leg)        Walk 50 feet activity   Assist Walk 50 feet with 2 turns activity did not occur: Safety/medical concerns (weakness, deconditioning, condition of contralateral leg)         Walk 150 feet activity   Assist Walk 150 feet activity did not occur: Safety/medical concerns (weakness, deconditioning, condition of contralateral leg)         Walk 10 feet on uneven surface  activity   Assist Walk 10 feet on uneven surfaces activity did not occur: Safety/medical concerns (weakness, deconditioning, condition of contralateral leg)         Wheelchair     Assist Is the patient using a wheelchair?: Yes Type of Wheelchair: Manual Wheelchair activity did not occur: Safety/medical concerns (weakness, deconditioning)    Max wheelchair distance: 64'    Wheelchair 50 feet with 2 turns activity    Assist    Wheelchair 50 feet with 2 turns activity did not occur: Safety/medical concerns (weakness, deconditioning)   Assist Level: Supervision/Verbal cueing   Wheelchair 150 feet activity     Assist  Wheelchair 150 feet activity did not occur: Safety/medical concerns (weakness, deconditioning)   Assist Level: Maximal Assistance - Patient 25 - 49%   Blood pressure (!) 84/59, pulse 96, temperature 97.6 F (36.4 C), temperature source Oral, resp. rate 18, height 6' 0.99 (1.854 m), weight 81.3 kg, SpO2 94%.   Medical Problem List and Plan: 1. Functional deficits secondary to Debility due to CHF with hx of R BKA             -patient may shower             -ELOS/Goals: 10-14 days, min A PT/OT             -Continue CIR 2.  Antithrombotics: -DVT/anticoagulation:   Pharmaceutical: Eliquis              -antiplatelet therapy: N/A 3. Pain Management: Tylenol  prn for mild and oxycodone  prn for severe pain.  Chronic R shoulder injury.  4. Mood/Behavior/Sleep: LCSW to follow for evaluation and support.              -antipsychotic agents: N/A 5. Neuropsych/cognition: This patient is capable of making decisions on his own behalf. 6. Skin/Wound Care: Routine pressure relief 7. Fluids/Electrolytes/Nutrition: Monitor I/O. Check CMET in am.             --  Juven and [employer] farms added for  nutritional supplement/wound healing.  8. Acute on chronic diastolic CHF: Strict I/O with daily weights.              --on Losartan , Spironolactone , lasix  and Lipitor.    -1/30 no signs of volume overload noted   Filed Weights   06/24/24 0300 06/24/24 0425 06/25/24 0408  Weight: 81.5 kg 79.3 kg 81.3 kg    9. Hypokalemia: Monitor for recurrence with Lasix  on board. K+ 4.3 on 1/28  1/29 K+ 3.6 monitor  1/30 recheck today 10.  PAF: Monitor HR TID--on diltiazem  and Apixaban .  -HR stable 11.  Asthma/COPD: Continue chronic steroids--prednisone  15 mg w/  with MDI. 12. GERD: Managed on Protonix .  13.  Hx of Seizures: Controlled on Lamotrigine  14. BPH: Continue Flomax . Continent. 15. Neuropathy: On Lyrica  and cymbalta .  16. Thrombocytopenia: Boderline low and likely reactive. Recheck platelets in am.  --Monitor for signs of bleeding  -1/29PLT 165 improved, monitor 17.Buttock pain: Reports severe with certain movements. EPC cream with foam dressing.  --Hx of sacral decub--Buttocks red with small open area on top of gluteal cleft --Will order air mattress for pressure relief and to prevent breakdown.  18. Anemia of chronic disease?: Will order anemia panel.   -1/29 HGB stable 11  -Ferritin normal, likely anemia of chronic disease 19. HLD: continue statin 20. Leukocytosis-mild   -no signs/symptoms infection noted, monitor   -1/30 recheck today, no signs of symptoms of infection  noted  --1/30 addendum,  WBC higher at 12.1. Recheck tomorrow. Will add CXR for tomorrow. Consider U/A if not improving.  21. Pt reports hx of B12/Vit D supplementation before admission. B12 level was 3,166.  -check D with next labs 22.  Hypotension, asymptomatic   - Ordered orthostatic vital signs-pending  -1/30 addendum,  BP improved but BUN/Cr noted to be higher- hold lasix  40mg  daily-recheck tomorrow      06/25/2024    8:00 AM 06/25/2024    4:08 AM 06/24/2024    7:26 PM  Vitals with BMI  Weight  179 lbs 4 oz   BMI  23.65   Systolic 84  95  Diastolic 59  70  Pulse 96 83 84     LOS: 2 days A FACE TO FACE EVALUATION WAS PERFORMED  Murray Collier 06/25/2024, 12:23 PM     "

## 2024-06-25 NOTE — Progress Notes (Signed)
 Doctor notified patient L ankle is swollen. Swelling is not in foot , small amount of swelling in Lower leg above ankle

## 2024-06-25 NOTE — Plan of Care (Signed)
" °  Problem: Consults Goal: RH GENERAL PATIENT EDUCATION Description: See Patient Education module for education specifics. Outcome: Progressing   Problem: RH BOWEL ELIMINATION Goal: RH STG MANAGE BOWEL WITH ASSISTANCE Description: STG Manage Bowel with min Assistance. Outcome: Progressing   Problem: RH BLADDER ELIMINATION Goal: RH STG MANAGE BLADDER WITH ASSISTANCE Description: STG Manage Bladder With min Assistance Outcome: Progressing   Problem: RH SKIN INTEGRITY Goal: RH STG SKIN FREE OF INFECTION/BREAKDOWN Description: Manage skin free of infection/breakdown with min assistance Outcome: Progressing   Problem: RH SAFETY Goal: RH STG ADHERE TO SAFETY PRECAUTIONS W/ASSISTANCE/DEVICE Description: STG Adhere to Safety Precautions With Assistance/Device. Outcome: Progressing   Problem: RH PAIN MANAGEMENT Goal: RH STG PAIN MANAGED AT OR BELOW PT'S PAIN GOAL Description: <4 w/ prns Outcome: Progressing   Problem: RH KNOWLEDGE DEFICIT GENERAL Goal: RH STG INCREASE KNOWLEDGE OF SELF CARE AFTER HOSPITALIZATION Description: Manage increase knowledge of self care after hospitalization with min assistance from spouse using educational materials provided Outcome: Progressing   "

## 2024-06-25 NOTE — Progress Notes (Signed)
 Patient ID: Levi [name], male   DOB: 11/03/1945, 79 y.o.   MRN: [id number]  [name] following

## 2024-06-25 NOTE — Progress Notes (Signed)
 Physical Therapy Session Note  Patient Details  Name: Levi [name] MRN: [id number] Date of Birth: 1946/04/29  Today's Date: 06/25/2024 PT Individual Time: 0923-1003 PT Individual Time Calculation (min): 40 min   Short Term Goals: Week 1:  PT Short Term Goal 1 (Week 1): pt will perform bed mobility with CGA overall PT Short Term Goal 2 (Week 1): pt will transfer bed<>chair with LRAD and CGA PT Short Term Goal 3 (Week 1): pt will perform sit<>stand with LRAD and mod A  Skilled Therapeutic Interventions/Progress Updates:  Patient seated upright in w/c on entrance to room. Patient alert and agreeable to PT session.   Patient with no pain complaint while seated in w/c at start of session.  Pt's current barriers to d/c include decreased cardiopulmonary strength, decreased physical physical strength, multiple wounds including one preventing use of prosthetic, and decreased pain-free ROM to R shoulder.   So, focus this session on functional strengthening of LLE, challenge to cardiopulmonary strength, coordination of technique for transfers.  Transported dependently via w/c to main therapy gym to use // bars for transfer practice. Pt with new skin tear to UE that he relates was from staff attempting to help pt during transfer.   Therapeutic Activity: Transfers: Pt attempted sit<>stand in //bars to LLE. Pt unable to perform without physical assist and provided with MaxA along with guard to RLE residual limb. Attempted with different arm positions d/t reduced ROM of R shoulder but pt unable to produce strength to reach standing.   Session shifted to focus on performance of slide board transfers. Transported back to room and educated on board placement. Pt with good ability to weight shift for MaxA board placement. With slight downhill grade, pt demos ability to perform with MinA for initiation and CGA to complete. Performed with same LOA for return to w/c.   Patient seated upright in w/c at end of  session with brakes locked, no alarm set and none found in room, and all needs within reach. NT notified re: need for alarm placement.    Therapy Documentation Precautions:  Precautions Precautions: Fall Recall of Precautions/Restrictions: Intact Precaution/Restrictions Comments: acute compression deformities T6-T7; h/o R BKA with wounds on distal aspect of limb Restrictions Weight Bearing Restrictions Per Provider Order: Yes RLE Weight Bearing Per Provider Order: Non weight bearing  Pain:  Pt with minimal pain complaint in R shoulder with attempt to stand. Addressed with repositioning.   Therapy/Group: Individual Therapy  Mliss DELENA Milliner PT, DPT, CSRS 06/25/2024, 10:30 AM

## 2024-06-25 NOTE — Progress Notes (Signed)
 Occupational Therapy Session Note  Patient Details  Name: Levi [name] MRN: [id number] Date of Birth: 19-Jul-1945  Session 1: Today's Date: 06/25/2024 OT Individual Time: 9299-9183 OT Individual Time Calculation (min): 76 min   Session 2: Today's Date: 06/25/2024 OT Individual Time: 8652-8493 OT Individual Time Calculation (min): 79 min   Short Term Goals: Week 1:  OT Short Term Goal 1 (Week 1): Pt will complete LB dressing with MOD A using AD PRN OT Short Term Goal 2 (Week 1): Pt will complete toileting with MOD A using AD/LRAD PRN OT Short Term Goal 3 (Week 1): Pt will maintain dynamic sitting balance during ADL with CGA OT Short Term Goal 4 (Week 1): Pt will complete toilet transfer with MIN A  Session 1: Skilled Therapeutic Interventions/Progress Updates:  Patient agreeable to participate in OT session. Reports 0/10 pain level.   Patient participated in skilled OT session focusing on self care, transfer training, activity tolerance. Patient received sitting in chair. Patient required set up for eating with decreased fine motor control. OT and patient attempted to complete transfer to shower chair with max A. Patient demonstrated increased fatigue and decreased balance on slide board. Patient then transferred back to bed mod A to increase ability to complete LB bathing. Completed LB bathing on EOB mod A for assist with lower limb. Patient then completed LB dressing max A at bed level for threading limbs and pulling pants up. Patient then transferred to chair CGA to min, min to mod for placement, CGA for sliding to chair. Patient then left in room all needs in reach to finish meal.     Session 2: Skilled Therapeutic Interventions/Progress Updates:  Patient agreeable to participate in OT session. Reports no pain level.   Patient participated in skilled OT session focusing on transfer training, activity tolerance, wound management. Patient received in bed reporting fatigue. Patient  advised that PA discussed changing footwear due to skin integrity. While changing footwear OT noticed edema in patient ankle reported to RN. Patient then completed UBE to 3x3 minutes with 2 minute rest to increased cardiorespiratory endurance and activity tolerance due to poor ability to complete transfers, LB dressing 2/2 fatigue. Patient then completed Core strengthening via rotations 4# small medicine ball to increase toileitng, LB dressing, transfers. Due to chair sliding during transfers patient and OT completed chair switch to provide stable base to facilitate increased transfers. Patient then returned to room following, all needs in reach.   Therapy Documentation Precautions:  Precautions Precautions: Fall Recall of Precautions/Restrictions: Intact Precaution/Restrictions Comments: acute compression deformities T6-T7; h/o R BKA with wounds on distal aspect of limb Restrictions Weight Bearing Restrictions Per Provider Order: Yes RLE Weight Bearing Per Provider Order: Non weight bearing  Therapy/Group: Individual Therapy  [name] 06/25/2024, 7:46 AM

## 2024-06-25 NOTE — Progress Notes (Signed)
 Patient BP 84/59 map 68 doctor notified at bedside. Sated he would look at chart. Patient denies any distress at this time. NO SOB

## 2024-06-26 ENCOUNTER — Inpatient Hospital Stay (HOSPITAL_COMMUNITY)

## 2024-06-26 DIAGNOSIS — R195 Other fecal abnormalities: Secondary | ICD-10-CM

## 2024-06-26 DIAGNOSIS — I509 Heart failure, unspecified: Secondary | ICD-10-CM

## 2024-06-26 DIAGNOSIS — R5381 Other malaise: Secondary | ICD-10-CM | POA: Diagnosis not present

## 2024-06-26 DIAGNOSIS — E876 Hypokalemia: Secondary | ICD-10-CM | POA: Diagnosis not present

## 2024-06-26 LAB — BASIC METABOLIC PANEL WITH GFR
Anion gap: 10 (ref 5–15)
BUN: 36 mg/dL — ABNORMAL HIGH (ref 8–23)
CO2: 23 mmol/L (ref 22–32)
Calcium: 8.7 mg/dL — ABNORMAL LOW (ref 8.9–10.3)
Chloride: 105 mmol/L (ref 98–111)
Creatinine, Ser: 0.6 mg/dL — ABNORMAL LOW (ref 0.61–1.24)
GFR, Estimated: >60 mL/min
Glucose, Bld: 85 mg/dL (ref 70–99)
Potassium: 3.3 mmol/L — ABNORMAL LOW (ref 3.5–5.1)
Sodium: 138 mmol/L (ref 135–145)

## 2024-06-26 LAB — CBC
HCT: 35.3 % — ABNORMAL LOW (ref 39.0–52.0)
Hemoglobin: 11.2 g/dL — ABNORMAL LOW (ref 13.0–17.0)
MCH: 28.1 pg (ref 26.0–34.0)
MCHC: 31.7 g/dL (ref 30.0–36.0)
MCV: 88.7 fL (ref 80.0–100.0)
Platelets: 180 10*3/uL (ref 150–400)
RBC: 3.98 MIL/uL — ABNORMAL LOW (ref 4.22–5.81)
RDW: 16.4 % — ABNORMAL HIGH (ref 11.5–15.5)
WBC: 12.1 10*3/uL — ABNORMAL HIGH (ref 4.0–10.5)
nRBC: 0 % (ref 0.0–0.2)

## 2024-06-26 LAB — GLUCOSE, CAPILLARY: Glucose-Capillary: 99 mg/dL (ref 70–99)

## 2024-06-26 MED ORDER — POTASSIUM CHLORIDE CRYS ER 20 MEQ PO TBCR
40.0000 meq | EXTENDED_RELEASE_TABLET | Freq: Once | ORAL | Status: AC
  Administered 2024-06-26: 40 meq via ORAL
  Filled 2024-06-26: qty 2

## 2024-06-26 MED ORDER — FUROSEMIDE 40 MG PO TABS
40.0000 mg | ORAL_TABLET | Freq: Every day | ORAL | Status: DC
  Administered 2024-06-26 – 2024-06-27 (×2): 40 mg via ORAL
  Filled 2024-06-26 (×2): qty 1
  Filled 2024-06-26: qty 2

## 2024-06-26 MED ORDER — PSYLLIUM 95 % PO PACK
1.0000 | PACK | Freq: Every day | ORAL | Status: AC
  Administered 2024-06-26 – 2024-07-02 (×7): 1 via ORAL
  Filled 2024-06-26 (×7): qty 1

## 2024-06-26 NOTE — Progress Notes (Signed)
 "                                                        PROGRESS NOTE   Subjective/Complaints:  Pt doing well, slept well, pain well managed, LBM last night which was a little loose but not watery/diarrhea-- and again this morning after eval. Urinating fine. No other complaints or concerns.   ROS: as per HPI. Denies CP, SOB, abd pain, N/V/D/C, or any other complaints at this time.    Chronic right shoulder pain    Objective:   No results found. Recent Labs    06/25/24 1619 06/26/24 0555  WBC 12.1* 12.1*  HGB 11.3* 11.2*  HCT 35.9* 35.3*  PLT 174 180   Recent Labs    06/25/24 1619 06/26/24 0555  NA 136 138  K 3.6 3.3*  CL 102 105  CO2 20* 23  GLUCOSE 132* 85  BUN 44* 36*  CREATININE 0.94 0.60*  CALCIUM  8.8* 8.7*    Intake/Output Summary (Last 24 hours) at 06/26/2024 0958 Last data filed at 06/26/2024 0919 Gross per 24 hour  Intake 436 ml  Output 650 ml  Net -214 ml        Physical Exam: Vital Signs Blood pressure 135/77, pulse 89, temperature 97.7 F (36.5 C), temperature source Oral, resp. rate 18, height 6' 0.99 (1.854 m), weight 81.4 kg, SpO2 96%.   General: No apparent distress, Frail, chronically ill appearing male. Sitting up in bed-appears comfortable HEENT: Head is normocephalic, atraumatic, sclera anicteric, oral mucosa moist Neck: Supple without JVD or lymphadenopathy Heart: Reg rate and rhythm. No murmurs rubs or gallops Chest: mild rales at bases, non-labored, no wheezing, on RA Abdomen: Soft, non-tender, non-distended, bowel sounds positive. Extremities: R BKA, trace LLE edema  Psych: Pt's affect is appropriate. Pt is cooperative. Very pleasant Skin:  Right BKA with evidence of two small healed abrasions. Tibial tuberosity with pressure areas, petechiae right inner thigh. Open area (appears to have 1 cm depth) right lateral BKA site around fibular head-nail covered in dry dressing-- all covered with mepilex now  Dry flaky paper thin skin  with multiple ecchymotic areas and healing skin tears on four limbs. Hematomas on left shin anterior and posteriorly.   Buttocks red with small open area on top of gluteal cleft-- not reassessed  PRIOR EXAMS: Neuro:     Mental Status: AAOx4 Speech/Languate: Naming and repetition intact, fluent, follows simple commands CRANIAL NERVES: 2-12 grossly intact     MOTOR: RUE: 1-2/5 Deltoid, 4/5 Biceps, 4/5 Triceps,4/5 Grip LUE: 4/5 Deltoid, 4/5 Biceps, 4/5 Triceps, 4/5 Grip RLE: HF 4/5, KE 4/5 LLE: HF 4/5, KE 4/5, ADF 2-3/5, APF 3/5   SENSORY: Altered to LT in b/l UE and LLE   MSK: decreased PROM R shoulder- chronic                  Assessment/Plan: 1. Functional deficits which require 3+ hours per day of interdisciplinary therapy in a comprehensive inpatient rehab setting. Physiatrist is providing close team supervision and 24 hour management of active medical problems listed below. Physiatrist and rehab team continue to assess barriers to discharge/monitor patient progress toward functional and medical goals  Care Tool:  Bathing    Body parts bathed by patient: Right arm, Left arm, Chest, Abdomen, Front perineal area, Face  Body parts bathed by helper: Buttocks, Right upper leg, Left upper leg, Left lower leg, Left arm     Bathing assist Assist Level: Maximal Assistance - Patient 24 - 49%     Upper Body Dressing/Undressing Upper body dressing   What is the patient wearing?: Pull over shirt    Upper body assist Assist Level: Moderate Assistance - Patient 50 - 74%    Lower Body Dressing/Undressing Lower body dressing      What is the patient wearing?: Pants, Underwear/pull up     Lower body assist Assist for lower body dressing: Maximal Assistance - Patient 25 - 49%     Toileting Toileting    Toileting assist Assist for toileting: Total Assistance - Patient < 25%     Transfers Chair/bed transfer  Transfers assist  Chair/bed transfer activity  did not occur: Safety/medical concerns  Chair/bed transfer assist level: Moderate Assistance - Patient 50 - 74%     Locomotion Ambulation   Ambulation assist   Ambulation activity did not occur: Safety/medical concerns (weakness, deconditioning, condition of contralateral leg)          Walk 10 feet activity   Assist  Walk 10 feet activity did not occur: Safety/medical concerns (weakness, deconditioning, condition of contralateral leg)        Walk 50 feet activity   Assist Walk 50 feet with 2 turns activity did not occur: Safety/medical concerns (weakness, deconditioning, condition of contralateral leg)         Walk 150 feet activity   Assist Walk 150 feet activity did not occur: Safety/medical concerns (weakness, deconditioning, condition of contralateral leg)         Walk 10 feet on uneven surface  activity   Assist Walk 10 feet on uneven surfaces activity did not occur: Safety/medical concerns (weakness, deconditioning, condition of contralateral leg)         Wheelchair     Assist Is the patient using a wheelchair?: Yes Type of Wheelchair: Manual Wheelchair activity did not occur: Safety/medical concerns (weakness, deconditioning)    Max wheelchair distance: 83'    Wheelchair 50 feet with 2 turns activity    Assist    Wheelchair 50 feet with 2 turns activity did not occur: Safety/medical concerns (weakness, deconditioning)   Assist Level: Supervision/Verbal cueing   Wheelchair 150 feet activity     Assist  Wheelchair 150 feet activity did not occur: Safety/medical concerns (weakness, deconditioning)   Assist Level: Maximal Assistance - Patient 25 - 49%   Blood pressure 135/77, pulse 89, temperature 97.7 F (36.5 C), temperature source Oral, resp. rate 18, height 6' 0.99 (1.854 m), weight 81.4 kg, SpO2 96%.   Medical Problem List and Plan: 1. Functional deficits secondary to Debility due to CHF with hx of R BKA              -patient may shower             -ELOS/Goals: 10-14 days, min A PT/OT             -Continue CIR 2.  Antithrombotics: -DVT/anticoagulation:  Pharmaceutical: Eliquis  5mg  BID             -antiplatelet therapy: N/A 3. Pain Management: Tylenol  prn for mild and oxycodone  prn for severe pain.  Chronic R shoulder injury.  4. Mood/Behavior/Sleep: LCSW to follow for evaluation and support.              -antipsychotic agents: N/A  -Trazodone  PRN 5. Neuropsych/cognition: This patient  is capable of making decisions on his own behalf. 6. Skin/Wound Care: Routine pressure relief 7. Fluids/Electrolytes/Nutrition: Monitor I/O. Monitor labs. Continue vitamins             --Juven and [employer] farms added for  nutritional supplement/wound healing.  8. Acute on chronic diastolic CHF: Strict I/O with daily weights.  -on Losartan  (held?), Spironolactone  12.5mg  daily, lasix  40mg  daily and Lipitor 20mg  daily.   -06/26/24 no signs of volume overload noted, wt stable; of note, Lasix  d/c'd as of yesterday d/t Cr, but Cr today improved at 0.6. Will restart lasix .    Filed Weights   06/24/24 0425 06/25/24 0408 06/26/24 0407  Weight: 79.3 kg 81.3 kg 81.4 kg    9. Hypokalemia: Monitor for recurrence with Lasix  on board. K+ 4.3 on 1/28  -1/29 K+ 3.6 monitor  -06/26/24 K 3.3, will replete with 40mEq x1 dose 10.  PAF: Monitor HR TID--on diltiazem  180mg  daily and Apixaban .  -HR stable 11.  Asthma/COPD: Continue chronic steroids--prednisone  15 mg with MDI. Tesslon perles 100mg  TID and Robitussin 10ml TID.  -On Bactrim  preventatively? 12. GERD: Managed on Protonix  40mg  BID and acidophilus 13.  Hx of Seizures: Controlled on Lamotrigine  25mg  daily 14. BPH: Continue Flomax  0.4mg  nightly. Continent. 15. Neuropathy: On Lyrica  75mg  daily and cymbalta  30mg  3x/wk.  16. Thrombocytopenia: Boderline low and likely reactive. Recheck platelets in am.  --Monitor for signs of bleeding  -1/29 PLT 165 improved, monitor--> 180 1/31 17.  Buttock pain: Reports severe with certain movements. EPC cream with foam dressing.  --Hx of sacral decub--Buttocks red with small open area on top of gluteal cleft --Will order air mattress for pressure relief and to prevent breakdown.  18. Anemia of chronic disease?: Will order anemia panel. Continue Ferrous gluconate  324mg  daily  -1/29 HGB stable 11  -Ferritin normal, likely anemia of chronic disease 19. HLD: continue Lipitor 20mg  daily 20. Leukocytosis-mild   -no signs/symptoms infection noted, monitor   -1/30 recheck today, no signs of symptoms of infection noted -1/30 addendum,  WBC higher at 12.1. Recheck tomorrow. Will add CXR for tomorrow. Consider U/A if not improving.  -06/26/24 stable WBC 12.1, no fever/infectious symptoms, CXR pending. Monitor for now 21. Pt reports hx of B12/Vit D supplementation before admission. B12 level was 3,166.  -check D with next labs 22.  Hypotension, asymptomatic   - Ordered orthostatic vital signs-pending -1/30 addendum,  BP improved but BUN/Cr noted to be higher- hold lasix  40mg  daily-recheck tomorrow -06/26/24 improved Cr 0.6, restarting lasix  as above, BP doing well   Vitals:   06/23/24 2140 06/24/24 1558 06/24/24 1926 06/25/24 0800  BP: (!) 143/76 (!) 90/54 95/70 (!) 84/59   06/25/24 1352 06/25/24 1944 06/26/24 0407  BP: (!) 127/104 (!) 116/90 135/77     23. Loose stools: added metamucil 06/26/24, on acidophilus. No frank diarrhea.    LOS: 3 days A FACE TO FACE EVALUATION WAS PERFORMED  [address] 06/26/2024, 9:58 AM     "

## 2024-06-27 DIAGNOSIS — E876 Hypokalemia: Secondary | ICD-10-CM | POA: Diagnosis not present

## 2024-06-27 DIAGNOSIS — R5381 Other malaise: Secondary | ICD-10-CM | POA: Diagnosis not present

## 2024-06-27 DIAGNOSIS — R195 Other fecal abnormalities: Secondary | ICD-10-CM | POA: Diagnosis not present

## 2024-06-27 DIAGNOSIS — I509 Heart failure, unspecified: Secondary | ICD-10-CM | POA: Diagnosis not present

## 2024-06-27 LAB — GLUCOSE, CAPILLARY: Glucose-Capillary: 79 mg/dL (ref 70–99)

## 2024-06-27 NOTE — Progress Notes (Signed)
 "                                                        PROGRESS NOTE   Subjective/Complaints:  Pt doing well again, slept well, pain well managed, LBM yesterday. Urinating fine. No other complaints or concerns.   ROS: as per HPI. Denies CP, SOB, abd pain, N/V/D/C, or any other complaints at this time.    Chronic right shoulder pain    Objective:   DG Chest 2 View Result Date: 06/26/2024 EXAM: 2 VIEW(S) XRAY OF THE CHEST 06/26/2024 09:46:00 AM COMPARISON: 06/19/2024 CLINICAL HISTORY: Leukocytosis. FINDINGS: LUNGS AND PLEURA: Improved left basilar opacity. Improved interstitial edema. Low lung volumes. Small left pleural effusion. No pneumothorax. HEART AND MEDIASTINUM: No acute abnormality of the cardiac and mediastinal silhouettes. BONES AND SOFT TISSUES: Cervical fixation hardware. Left shoulder arthroplasty. Old left rib fracture deformities. IMPRESSION: 1. Improved left basilar opacity and interstitial edema. 2. Small left pleural effusion. 3. Low lung volumes. Electronically signed by: Waddell Calk MD 06/26/2024 01:37 PM EST RP Workstation: [id number]   Recent Labs    06/25/24 1619 06/26/24 0555  WBC 12.1* 12.1*  HGB 11.3* 11.2*  HCT 35.9* 35.3*  PLT 174 180   Recent Labs    06/25/24 1619 06/26/24 0555  NA 136 138  K 3.6 3.3*  CL 102 105  CO2 20* 23  GLUCOSE 132* 85  BUN 44* 36*  CREATININE 0.94 0.60*  CALCIUM  8.8* 8.7*    Intake/Output Summary (Last 24 hours) at 06/27/2024 0818 Last data filed at 06/27/2024 0743 Gross per 24 hour  Intake 920 ml  Output 900 ml  Net 20 ml        Physical Exam: Vital Signs Blood pressure 122/88, pulse 91, temperature 97.8 F (36.6 C), temperature source Oral, resp. rate 16, height 6' 0.99 (1.854 m), weight 81.4 kg, SpO2 98%.   General: No apparent distress, Frail, chronically ill appearing male. Resting comfortably in bed HEENT: Head is normocephalic, atraumatic, sclera anicteric, oral mucosa moist Neck: Supple without JVD  or lymphadenopathy Heart: Reg rate and rhythm. No murmurs rubs or gallops Chest: mild rales at bases, non-labored, no wheezing, on RA Abdomen: Soft, non-tender, non-distended, bowel sounds positive. Extremities: R BKA, trace LLE edema  Psych: Pt's affect is appropriate. Pt is cooperative. Very pleasant Skin:  Right BKA with evidence of two small healed abrasions. Tibial tuberosity with pressure areas, petechiae right inner thigh. Open area (appears to have 1 cm depth) right lateral BKA site around fibular head-nail covered in dry dressing-- all covered with mepilex now  Dry flaky paper thin skin with multiple ecchymotic areas and healing skin tears on four limbs. Hematomas on left shin anterior and posteriorly.   Buttocks red with small open area on top of gluteal cleft-- not reassessed  PRIOR EXAMS: Neuro:     Mental Status: AAOx4 Speech/Languate: Naming and repetition intact, fluent, follows simple commands CRANIAL NERVES: 2-12 grossly intact     MOTOR: RUE: 1-2/5 Deltoid, 4/5 Biceps, 4/5 Triceps,4/5 Grip LUE: 4/5 Deltoid, 4/5 Biceps, 4/5 Triceps, 4/5 Grip RLE: HF 4/5, KE 4/5 LLE: HF 4/5, KE 4/5, ADF 2-3/5, APF 3/5   SENSORY: Altered to LT in b/l UE and LLE   MSK: decreased PROM R shoulder- chronic  Assessment/Plan: 1. Functional deficits which require 3+ hours per day of interdisciplinary therapy in a comprehensive inpatient rehab setting. Physiatrist is providing close team supervision and 24 hour management of active medical problems listed below. Physiatrist and rehab team continue to assess barriers to discharge/monitor patient progress toward functional and medical goals  Care Tool:  Bathing    Body parts bathed by patient: Right arm, Left arm, Chest, Abdomen, Front perineal area, Face   Body parts bathed by helper: Buttocks, Right upper leg, Left upper leg, Left lower leg, Left arm     Bathing assist Assist Level: Maximal Assistance -  Patient 24 - 49%     Upper Body Dressing/Undressing Upper body dressing   What is the patient wearing?: Pull over shirt    Upper body assist Assist Level: Moderate Assistance - Patient 50 - 74%    Lower Body Dressing/Undressing Lower body dressing      What is the patient wearing?: Pants, Underwear/pull up     Lower body assist Assist for lower body dressing: Maximal Assistance - Patient 25 - 49%     Toileting Toileting    Toileting assist Assist for toileting: Total Assistance - Patient < 25%     Transfers Chair/bed transfer  Transfers assist  Chair/bed transfer activity did not occur: Safety/medical concerns  Chair/bed transfer assist level: Moderate Assistance - Patient 50 - 74%     Locomotion Ambulation   Ambulation assist   Ambulation activity did not occur: Safety/medical concerns (weakness, deconditioning, condition of contralateral leg)          Walk 10 feet activity   Assist  Walk 10 feet activity did not occur: Safety/medical concerns (weakness, deconditioning, condition of contralateral leg)        Walk 50 feet activity   Assist Walk 50 feet with 2 turns activity did not occur: Safety/medical concerns (weakness, deconditioning, condition of contralateral leg)         Walk 150 feet activity   Assist Walk 150 feet activity did not occur: Safety/medical concerns (weakness, deconditioning, condition of contralateral leg)         Walk 10 feet on uneven surface  activity   Assist Walk 10 feet on uneven surfaces activity did not occur: Safety/medical concerns (weakness, deconditioning, condition of contralateral leg)         Wheelchair     Assist Is the patient using a wheelchair?: Yes Type of Wheelchair: Manual Wheelchair activity did not occur: Safety/medical concerns (weakness, deconditioning)    Max wheelchair distance: 64'    Wheelchair 50 feet with 2 turns activity    Assist    Wheelchair 50 feet with 2  turns activity did not occur: Safety/medical concerns (weakness, deconditioning)   Assist Level: Supervision/Verbal cueing   Wheelchair 150 feet activity     Assist  Wheelchair 150 feet activity did not occur: Safety/medical concerns (weakness, deconditioning)   Assist Level: Maximal Assistance - Patient 25 - 49%   Blood pressure 122/88, pulse 91, temperature 97.8 F (36.6 C), temperature source Oral, resp. rate 16, height 6' 0.99 (1.854 m), weight 81.4 kg, SpO2 98%.   Medical Problem List and Plan: 1. Functional deficits secondary to Debility due to CHF with hx of R BKA             -patient may shower             -ELOS/Goals: 10-14 days, min A PT/OT             -  Continue CIR 2.  Antithrombotics: -DVT/anticoagulation:  Pharmaceutical: Eliquis  5mg  BID             -antiplatelet therapy: N/A 3. Pain Management: Tylenol  prn for mild and oxycodone  prn for severe pain.  Chronic R shoulder injury.  4. Mood/Behavior/Sleep: LCSW to follow for evaluation and support.              -antipsychotic agents: N/A  -Trazodone  PRN 5. Neuropsych/cognition: This patient is capable of making decisions on his own behalf. 6. Skin/Wound Care: Routine pressure relief 7. Fluids/Electrolytes/Nutrition: Monitor I/O. Monitor labs. Continue vitamins             --[employer] added for  nutritional supplement/wound healing.  8. Acute on chronic diastolic CHF: Strict I/O with daily weights.  -on Losartan  (held?), Spironolactone  12.5mg  daily, lasix  40mg  daily and Lipitor 20mg  daily.   -06/26/24 no signs of volume overload noted, wt stable; of note, Lasix  d/c'd as of yesterday d/t Cr, but Cr today improved at 0.6. Will restart lasix .    Filed Weights   06/24/24 0425 06/25/24 0408 06/26/24 0407  Weight: 79.3 kg 81.3 kg 81.4 kg    9. Hypokalemia: Monitor for recurrence with Lasix  on board. K+ 4.3 on 1/28  -1/29 K+ 3.6 monitor  -06/26/24 K 3.3, will replete with 40mEq x1 dose, recheck Monday 10.   PAF: Monitor HR TID--on diltiazem  180mg  daily and Apixaban .  -HR stable 11.  Asthma/COPD: Continue chronic steroids--prednisone  15 mg with MDI. Tesslon perles 100mg  TID and Robitussin 10ml TID.  -On Bactrim  preventatively? 12. GERD: Managed on Protonix  40mg  BID and acidophilus 13.  Hx of Seizures: Controlled on Lamotrigine  25mg  daily 14. BPH: Continue Flomax  0.4mg  nightly. Continent. 15. Neuropathy: On Lyrica  75mg  daily and cymbalta  30mg  3x/wk.  16. Thrombocytopenia: Boderline low and likely reactive. Recheck platelets in am.  --Monitor for signs of bleeding  -1/29 PLT 165 improved, monitor--> 180 1/31 17. Buttock pain: Reports severe with certain movements. EPC cream with foam dressing.  --Hx of sacral decub--Buttocks red with small open area on top of gluteal cleft --Will order air mattress for pressure relief and to prevent breakdown.  18. Anemia of chronic disease?: Will order anemia panel. Continue Ferrous gluconate  324mg  daily  -1/29 HGB stable 11  -Ferritin normal, likely anemia of chronic disease 19. HLD: continue Lipitor 20mg  daily 20. Leukocytosis-mild   -no signs/symptoms infection noted, monitor   -1/30 recheck today, no signs of symptoms of infection noted -1/30 addendum,  WBC higher at 12.1. Recheck tomorrow. Will add CXR for tomorrow. Consider U/A if not improving.  -06/26/24 stable WBC 12.1, no fever/infectious symptoms, CXR pending. Monitor for now -06/27/24 CXR neg for acute findings, no fevers overnight, monitor 21. Pt reports hx of B12/Vit D supplementation before admission. B12 level was 3,166.  -check D with next labs 22.  Hypotension, asymptomatic   - Ordered orthostatic vital signs-pending -1/30 addendum,  BP improved but BUN/Cr noted to be higher- hold lasix  40mg  daily-recheck tomorrow -06/26/24 improved Cr 0.6, restarting lasix  as above, BP doing well -06/27/24 BP stable, monitor with resumption of lasix    Vitals:   06/23/24 2140 06/24/24 1558 06/24/24 1926  06/25/24 0800  BP: (!) 143/76 (!) 90/54 95/70 (!) 84/59   06/25/24 1352 06/25/24 1944 06/26/24 0407 06/26/24 1534  BP: (!) 127/104 (!) 116/90 135/77 106/75   06/26/24 2032 06/27/24 0423  BP: 100/66 122/88     23. Loose stools: added metamucil 06/26/24, on acidophilus. No frank diarrhea.  LOS: 4 days A FACE TO FACE EVALUATION WAS PERFORMED  [address] 06/27/2024, 8:18 AM     "

## 2024-06-27 NOTE — Progress Notes (Signed)
 Physical Therapy Session Note  Patient Details  Name: Levi [name] MRN: [id number] Date of Birth: [birth year]  Today's Date: 06/27/2024 PT Individual Time: 1300-1409 PT Individual Time Calculation (min): 69 min   Short Term Goals: Week 1:  PT Short Term Goal 1 (Week 1): pt will perform bed mobility with CGA overall PT Short Term Goal 2 (Week 1): pt will transfer bed<>chair with LRAD and CGA PT Short Term Goal 3 (Week 1): pt will perform sit<>stand with LRAD and mod A  Skilled Therapeutic Interventions/Progress Updates:     Pt seated in WC upon arrival. Pt reports 5/10 pain in R shoulder at rest. Pt declined need for pain medication. Agreeable to therapy. Rest breaks and repositioning provided throughout session. Session emphasized functional strengthening, endurance/activity tolerance, transfers, and bed mobility. Pt self-propelled WC using B UE ~80 ft toward main gym with S - transported dependent remainder of distance due to R shoulder pain. Pt transferred WC to EOM (slightly uphill at lowest setting) with mod A +2. Pt likely more fatigued this afternoon. Pt performed modified sit ups from seated position with wedge placed behind while holding 2.2# weighted ball - 3x10 with rest breaks in between. Pt instructed in and performed 3x10 LAQ B LE with 2# ankle weight on L LE - rest breaks taken in between. SBT back to [facility] downhill with mod A and VC for anterior weight shifting and head hip ratio. PT positioned WC in  bars and pt worked on part practice anterior weight shifting for initial buttock clearance from [facility] seat (in preparation for standing) with B UE support with max A. Pt very fatigued afterwards. Pt transported back to room dependent for energy conservation. Pt SBT WC back to EOB with mod/max A and VC for anterior weight shifting and head hip ratio. Pt returned to supine with SBA, however, crooked in bed. Pt used B UE on bed rails and L knee bent (bridge position) to push/pull self up in bed  with min A - required several bouts with rest breaks due to fatigue. Pt doffed paper pants and donned clean pants with max A. All needs in reach at end of session.  Therapy Documentation Precautions:  Precautions Precautions: Fall Recall of Precautions/Restrictions: Intact Precaution/Restrictions Comments: acute compression deformities T6-T7; h/o R BKA with wounds on distal aspect of limb Restrictions Weight Bearing Restrictions Per Provider Order: Yes RLE Weight Bearing Per Provider Order: Non weight bearing  Therapy/Group: Individual Therapy  [name], PT, DPT 06/27/2024, 7:59 AM

## 2024-06-27 NOTE — Progress Notes (Signed)
 Physical Therapy Session Note  Patient Details  Name: Levi [name] MRN: [id number] Date of Birth: 02/20/1946  Today's Date: 06/27/2024 PT Individual Time: 0800-0856 PT Individual Time Calculation (min): 56 min   Short Term Goals: Week 1:  PT Short Term Goal 1 (Week 1): pt will perform bed mobility with CGA overall PT Short Term Goal 2 (Week 1): pt will transfer bed<>chair with LRAD and CGA PT Short Term Goal 3 (Week 1): pt will perform sit<>stand with LRAD and mod A  Skilled Therapeutic Interventions/Progress Updates:      Pt supine in bed upon arrival. Pt agreeable to therapy. Pt endorses 5/10 pain R shoulder and generalized pain. PT provided rest breaks and repositioning. Pt declining need for medication at this time.   Pt performed rolling B with min A to donn pants.   Pt performed supine to sit with use of bed feature and supervision.   Slide board transfer downhill with min A, verbal cues provided for anterior weight shift and head hip ratio.   Pt SOB after slide board transfer: BP 124/73 HR 50 RA SpO2 99  Pt attempted slide board transfer WC to lowest setting mat table however significantly up hill with hemi height WC. Required +2 A. Pt provided pt with 18x20 standard height WC for improved ease and independence.   Pt performed the following therex while supine on mat for B LE strengthening, endurance, activity tolerance, coordination:    1x10 SLR B  1x10 glute bridges  1x10 sidleying clamshells B active assisted  1x10 sidelying active assisted hip abduction  1x10 sidelying active assisted hip extension with knee bent   Pt performed rolling B on mat table with supervision to R and mod A to L.   Pt performed level slide board transfer mat table ot WC with CGA/min A.   Of note: bleeding on distal lateral residual limb wound. Notified nurse of need for dressing change.   Pt seated in WC at end of session with all needs within reach and chiar alarm on.          Therapy Documentation Precautions:  Precautions Precautions: Fall Recall of Precautions/Restrictions: Intact Precaution/Restrictions Comments: acute compression deformities T6-T7; h/o R BKA with wounds on distal aspect of limb Restrictions Weight Bearing Restrictions Per Provider Order: Yes RLE Weight Bearing Per Provider Order: Non weight bearing  Therapy/Group: Individual Therapy  [facility] Doreene Orris, [place], DPT  06/27/2024, 7:41 AM

## 2024-06-27 NOTE — Progress Notes (Signed)
 Occupational Therapy Session Note  Patient Details  Name: Levi [name] MRN: [id number] Date of Birth: 03-May-1946  Today's Date: 06/27/2024 OT Individual Time: 1102-1200 OT Individual Time Calculation (min): 58 min    Short Term Goals: Week 1:  OT Short Term Goal 1 (Week 1): Pt will complete LB dressing with MOD A using AD PRN OT Short Term Goal 2 (Week 1): Pt will complete toileting with MOD A using AD/LRAD PRN OT Short Term Goal 3 (Week 1): Pt will maintain dynamic sitting balance during ADL with CGA OT Short Term Goal 4 (Week 1): Pt will complete toilet transfer with MIN A  Skilled Therapeutic Interventions/Progress Updates:      Therapy Documentation Precautions:  Precautions Precautions: Fall Recall of Precautions/Restrictions: Intact Precaution/Restrictions Comments: acute compression deformities T6-T7; h/o R BKA with wounds on distal aspect of limb Restrictions Weight Bearing Restrictions Per Provider Order: Yes RLE Weight Bearing Per Provider Order: Non weight bearing General: Pt seated in W/C upon OT arrival, agreeable to OT.  Pain:  6/10 pain reported in Rt shoulder, activity, intermittent rest breaks, distractions provided for pain management, pt reports tolerable to proceed.   Exercises: Pt completed the following exercise circuit in order to improve functional activity, strength and endurance to prepare for ADLs such as bathing. Pt completed the following exercises in seated position with no noted LOB/SOB and 3x10 repetitions on each exercise: -bicep curls -triceps extensions -lateral leans   Other Treatments: OT applied KT tape to Rt shoulder in order to provide stability and decrease pain with use. Pt educated on risks and benefits of tape.   Pt seated in W/C at end of session with W/C alarm donned, call light within reach and 4Ps assessed.    Therapy/Group: Individual Therapy  Camie Hoe, OTD, OTR/L 06/27/2024, 12:18 PM

## 2024-06-28 ENCOUNTER — Other Ambulatory Visit (HOSPITAL_COMMUNITY): Payer: Self-pay

## 2024-06-28 ENCOUNTER — Inpatient Hospital Stay: Admitting: Urgent Care

## 2024-06-28 DIAGNOSIS — I5033 Acute on chronic diastolic (congestive) heart failure: Secondary | ICD-10-CM | POA: Diagnosis not present

## 2024-06-28 DIAGNOSIS — E876 Hypokalemia: Secondary | ICD-10-CM | POA: Diagnosis not present

## 2024-06-28 DIAGNOSIS — R5381 Other malaise: Secondary | ICD-10-CM | POA: Diagnosis not present

## 2024-06-28 DIAGNOSIS — D72829 Elevated white blood cell count, unspecified: Secondary | ICD-10-CM | POA: Diagnosis not present

## 2024-06-28 DIAGNOSIS — I959 Hypotension, unspecified: Secondary | ICD-10-CM | POA: Diagnosis not present

## 2024-06-28 LAB — CBC
HCT: 34.2 % — ABNORMAL LOW (ref 39.0–52.0)
Hemoglobin: 11 g/dL — ABNORMAL LOW (ref 13.0–17.0)
MCH: 28.1 pg (ref 26.0–34.0)
MCHC: 32.2 g/dL (ref 30.0–36.0)
MCV: 87.2 fL (ref 80.0–100.0)
Platelets: 217 10*3/uL (ref 150–400)
RBC: 3.92 MIL/uL — ABNORMAL LOW (ref 4.22–5.81)
RDW: 16.4 % — ABNORMAL HIGH (ref 11.5–15.5)
WBC: 10.7 10*3/uL — ABNORMAL HIGH (ref 4.0–10.5)
nRBC: 0 % (ref 0.0–0.2)

## 2024-06-28 LAB — BASIC METABOLIC PANEL WITH GFR
Anion gap: 13 (ref 5–15)
BUN: 28 mg/dL — ABNORMAL HIGH (ref 8–23)
CO2: 24 mmol/L (ref 22–32)
Calcium: 8.8 mg/dL — ABNORMAL LOW (ref 8.9–10.3)
Chloride: 101 mmol/L (ref 98–111)
Creatinine, Ser: 0.7 mg/dL (ref 0.61–1.24)
GFR, Estimated: >60 mL/min
Glucose, Bld: 86 mg/dL (ref 70–99)
Potassium: 3.5 mmol/L (ref 3.5–5.1)
Sodium: 138 mmol/L (ref 135–145)

## 2024-06-28 LAB — VITAMIN D 25 HYDROXY (VIT D DEFICIENCY, FRACTURES): Vit D, 25-Hydroxy: 41.3 ng/mL (ref 30–100)

## 2024-06-28 LAB — GLUCOSE, CAPILLARY: Glucose-Capillary: 94 mg/dL (ref 70–99)

## 2024-06-28 MED ORDER — FUROSEMIDE 20 MG PO TABS
20.0000 mg | ORAL_TABLET | Freq: Every day | ORAL | Status: AC
  Administered 2024-06-29 – 2024-07-02 (×4): 20 mg via ORAL
  Filled 2024-06-28 (×4): qty 1

## 2024-06-28 MED ORDER — POTASSIUM CHLORIDE CRYS ER 20 MEQ PO TBCR
20.0000 meq | EXTENDED_RELEASE_TABLET | Freq: Once | ORAL | Status: AC
  Administered 2024-06-28: 20 meq via ORAL
  Filled 2024-06-28: qty 1

## 2024-06-28 MED ORDER — ARFORMOTEROL TARTRATE 15 MCG/2ML IN NEBU
15.0000 ug | INHALATION_SOLUTION | Freq: Two times a day (BID) | RESPIRATORY_TRACT | Status: AC
  Administered 2024-06-28 – 2024-07-01 (×5): 15 ug via RESPIRATORY_TRACT
  Filled 2024-06-28 (×8): qty 2

## 2024-06-28 MED ORDER — HYDROCERIN EX CREA
TOPICAL_CREAM | Freq: Two times a day (BID) | CUTANEOUS | Status: AC
  Filled 2024-06-28: qty 113

## 2024-06-28 MED ORDER — UMECLIDINIUM BROMIDE 62.5 MCG/ACT IN AEPB
1.0000 | INHALATION_SPRAY | Freq: Every day | RESPIRATORY_TRACT | Status: AC
  Administered 2024-06-28 – 2024-06-30 (×2): 1 via RESPIRATORY_TRACT
  Filled 2024-06-28: qty 7

## 2024-06-28 NOTE — Progress Notes (Signed)
 Physical Therapy Session Note  Patient Details  Name: Levi [name] MRN: [id number] Date of Birth: 1945-11-26  Today's Date: 06/28/2024 PT Individual Time: 1401-1430 PT Individual Time Calculation (min): 29 min   Short Term Goals: Week 1:  PT Short Term Goal 1 (Week 1): pt will perform bed mobility with CGA overall PT Short Term Goal 2 (Week 1): pt will transfer bed<>chair with LRAD and CGA PT Short Term Goal 3 (Week 1): pt will perform sit<>stand with LRAD and mod A  Skilled Therapeutic Interventions/Progress Updates: Pt pesents in w/c, handed off from OT.  Pt wheeled to room for time conservation.  BP monitored in sitting at 95/75, HR 81 and pt does state minimall dizziness, but not as shown earlier.  Pt able to lean for SB placement but still max A for placement.  Pt requires mod A for transfer w/ continual cues for forward lean and head/hips.  Pt required seated rest breaks between multiple trials of SB transfer.  Pt required only supervision for sit to supine and use of side rails.  Pt able to roll side to side w/ cues for hooklying position to improve mobility.  Pt remained supine w/ all needs in reach.     Therapy Documentation Precautions:  Precautions Precautions: Fall Recall of Precautions/Restrictions: Intact Precaution/Restrictions Comments: acute compression deformities T6-T7; h/o R BKA with wounds on distal aspect of limb Restrictions Weight Bearing Restrictions Per Provider Order: Yes RLE Weight Bearing Per Provider Order: Non weight bearing General:   Vital Signs: Therapy Vitals Pulse Rate: 94 Resp: 16 BP: 97/68 Patient Position (if appropriate): Lying Oxygen Therapy SpO2: 98 % O2 Device: Room Air Pain:0/10      Therapy/Group: Individual Therapy  Kazoua Gossen P Muaz Shorey 06/28/2024, 2:33 PM

## 2024-06-28 NOTE — Plan of Care (Signed)
" °  Problem: Consults Goal: RH GENERAL PATIENT EDUCATION Description: See Patient Education module for education specifics. Outcome: Progressing   Problem: RH BOWEL ELIMINATION Goal: RH STG MANAGE BOWEL WITH ASSISTANCE Description: STG Manage Bowel with min Assistance. Outcome: Progressing   Problem: RH BLADDER ELIMINATION Goal: RH STG MANAGE BLADDER WITH ASSISTANCE Description: STG Manage Bladder With min Assistance Outcome: Progressing   Problem: RH SKIN INTEGRITY Goal: RH STG SKIN FREE OF INFECTION/BREAKDOWN Description: Manage skin free of infection/breakdown with min assistance Outcome: Progressing   Problem: RH SAFETY Goal: RH STG ADHERE TO SAFETY PRECAUTIONS W/ASSISTANCE/DEVICE Description: STG Adhere to Safety Precautions With Assistance/Device. Outcome: Progressing   Problem: RH PAIN MANAGEMENT Goal: RH STG PAIN MANAGED AT OR BELOW PT'S PAIN GOAL Description: <4 w/ prns Outcome: Progressing   "

## 2024-06-28 NOTE — Progress Notes (Signed)
 Physical Therapy Session Note  Patient Details  Name: Levi [name] MRN: [id number] Date of Birth: [birth year]  Today's Date: 06/28/2024 PT Individual Time: 0805-0900 PT Individual Time Calculation (min): 55 min   Short Term Goals: Week 1:  PT Short Term Goal 1 (Week 1): pt will perform bed mobility with CGA overall PT Short Term Goal 2 (Week 1): pt will transfer bed<>chair with LRAD and CGA PT Short Term Goal 3 (Week 1): pt will perform sit<>stand with LRAD and mod A  Skilled Therapeutic Interventions/Progress Updates:    Pt presents in room in bed, agreeable to PT. Pt report pain in L shoulder states this is ongoing. Session focused on therapeutic activities to facilitate improved mechanics for bed mobility, transfers, and improving activity tolerance as well as therapeutic exercises for BUE/BLE/core strengthening in sitting position needed to improve functional transfers and bed mobility. Pt requires max assist for donning brief and pants in supine, completes rolling bilaterally with min assist and hospital bed rails for pulling pants over hips. Pt completes supine to sit with min assist for trunk to upright with pt using hospital bed features, requires assist for anterior and lateral weight shift. Pt completes slidebaord transfer with set up assist from therapist for slideboard, min assist for scooting across board to [facility]. Pt completes WC mobiltiy with BUEs, slow propulsion speed but navigates from room to day room ~150'. Pt set up for transfer, able to manage [facility] armrests with supervision with verbal cues, therapist assist with managing leg rests. Pt completes slidebaord transfer with min assist (able to manage placing board with min assist) to come sitting on EOM. Pt provided with back support for intermittent rest breaks in modified reclined position. Pt completes seated therex for BUE/BLE/core strengthening including: - modified sit ups short sitting x10 - rows green tband x10 - band pull apart  x10 LUE only - LLE LAQ x10 - marches x10 BLE - hamstring curl green tband x10 LLE - hip extension (stomps) green tband x10 LLE - trunk extension against green tband x10 - lateral leans against green tband x10 bilaterally - WC press ups x10 (using BUEs on shortened part of armrest to provide increased comfort for R shoulder) Pt completes slideboard transfer min assist back to [facility] and transported back to room for time management. Pt set up at sink to complete seated ADLs, able to manage WC to reach call light if needed at end of session.   Therapy Documentation Precautions:  Precautions Precautions: Fall Recall of Precautions/Restrictions: Intact Precaution/Restrictions Comments: acute compression deformities T6-T7; h/o R BKA with wounds on distal aspect of limb Restrictions Weight Bearing Restrictions Per Provider Order: Yes RLE Weight Bearing Per Provider Order: Non weight bearing   Therapy/Group: Individual Therapy  Reche Ohara 06/28/2024, 12:49 PM

## 2024-06-28 NOTE — Plan of Care (Signed)
" °  Problem: Consults Goal: RH GENERAL PATIENT EDUCATION Description: See Patient Education module for education specifics. Outcome: Progressing   Problem: RH BOWEL ELIMINATION Goal: RH STG MANAGE BOWEL WITH ASSISTANCE Description: STG Manage Bowel with min Assistance. Outcome: Progressing   Problem: RH BLADDER ELIMINATION Goal: RH STG MANAGE BLADDER WITH ASSISTANCE Description: STG Manage Bladder With min Assistance Outcome: Progressing   Problem: RH SKIN INTEGRITY Goal: RH STG SKIN FREE OF INFECTION/BREAKDOWN Description: Manage skin free of infection/breakdown with min assistance Outcome: Progressing   Problem: RH SAFETY Goal: RH STG ADHERE TO SAFETY PRECAUTIONS W/ASSISTANCE/DEVICE Description: STG Adhere to Safety Precautions With Assistance/Device. Outcome: Progressing   Problem: RH PAIN MANAGEMENT Goal: RH STG PAIN MANAGED AT OR BELOW PT'S PAIN GOAL Description: <4 w/ prns Outcome: Progressing   Problem: RH KNOWLEDGE DEFICIT GENERAL Goal: RH STG INCREASE KNOWLEDGE OF SELF CARE AFTER HOSPITALIZATION Description: Manage increase knowledge of self care after hospitalization with min assistance from spouse using educational materials provided Outcome: Progressing   "

## 2024-06-28 NOTE — Progress Notes (Signed)
 Orthopedic Tech Progress Note Patient Details:  Levi [name] 1946/01/02 [id number] Called in Vive sock for LLE to [facility] Patient ID: Levi [name], male   DOB: 01-Nov-1945, 79 y.o.   MRN: [id number]  [name] [name] 06/28/2024, 11:22 AM

## 2024-06-28 NOTE — Progress Notes (Signed)
 Physical Therapy Session Note  Patient Details  Name: Levi [name] MRN: [id number] Date of Birth: May 12, 1946  Today's Date: 06/28/2024 PT Individual Time: 1000-1058 PT Individual Time Calculation (min): 58 min   Short Term Goals: Week 1:  PT Short Term Goal 1 (Week 1): pt will perform bed mobility with CGA overall PT Short Term Goal 2 (Week 1): pt will transfer bed<>chair with LRAD and CGA PT Short Term Goal 3 (Week 1): pt will perform sit<>stand with LRAD and mod A  Skilled Therapeutic Interventions/Progress Updates:     Pt seated in WC upon arrival. Pt denies pain but endorses worsening dizziness within the past 30 min - states he spoke to MD about it but dizziness has worsened since that conversation. Agreeable to therapy. Session emphasized hemodynamic management and LE strengthening. Assessed seated BP - 93/77 and 84/58 a few mins later - PT notified nursing and spoke to MD who stated his BP runs low and he is on several BP meds and recommended placing TED hose on L LE. PT initially planned to work on standing but focused on seated exercises due to dizziness. PT donned TED hose L LE dependent and ensured not wrinkles due to fragile skin. Seated in WC pt instructed in and performed LAQ and marching B LE with 2# ankle weights (placed on R thigh to avoid rubbing against wound dressings. Pt reported continued dizziness throughout session, however, denied any worsening of symptoms with activity. Once back in room, pt remained seated in [facility] with all needs in reach at end of session.  Therapy Documentation Precautions:  Precautions Precautions: Fall Recall of Precautions/Restrictions: Intact Precaution/Restrictions Comments: acute compression deformities T6-T7; h/o R BKA with wounds on distal aspect of limb Restrictions Weight Bearing Restrictions Per Provider Order: Yes RLE Weight Bearing Per Provider Order: Non weight bearing  Therapy/Group: Individual Therapy  [name], PT, DPT 06/28/2024, 7:42 AM

## 2024-06-29 ENCOUNTER — Inpatient Hospital Stay (HOSPITAL_COMMUNITY)

## 2024-06-29 ENCOUNTER — Ambulatory Visit: Admitting: Urgent Care

## 2024-06-29 DIAGNOSIS — I5033 Acute on chronic diastolic (congestive) heart failure: Secondary | ICD-10-CM | POA: Diagnosis not present

## 2024-06-29 DIAGNOSIS — D72829 Elevated white blood cell count, unspecified: Secondary | ICD-10-CM | POA: Diagnosis not present

## 2024-06-29 DIAGNOSIS — I959 Hypotension, unspecified: Secondary | ICD-10-CM | POA: Diagnosis not present

## 2024-06-29 DIAGNOSIS — E876 Hypokalemia: Secondary | ICD-10-CM | POA: Diagnosis not present

## 2024-06-29 DIAGNOSIS — R5381 Other malaise: Secondary | ICD-10-CM | POA: Diagnosis not present

## 2024-06-29 LAB — GLUCOSE, CAPILLARY: Glucose-Capillary: 78 mg/dL (ref 70–99)

## 2024-06-29 MED ORDER — PREGABALIN 50 MG PO CAPS
75.0000 mg | ORAL_CAPSULE | Freq: Every day | ORAL | Status: AC
  Administered 2024-06-30 – 2024-07-02 (×3): 75 mg via ORAL
  Filled 2024-06-29 (×3): qty 1

## 2024-06-29 MED ORDER — FERROUS GLUCONATE 324 (38 FE) MG PO TABS
324.0000 mg | ORAL_TABLET | Freq: Every day | ORAL | Status: AC
  Administered 2024-06-30 – 2024-07-02 (×3): 324 mg via ORAL
  Filled 2024-06-29 (×4): qty 1

## 2024-06-29 MED ORDER — KATE FARMS STANDARD 1.4 PO LIQD
325.0000 mL | Freq: Three times a day (TID) | ORAL | Status: DC
  Administered 2024-06-29: 325 mL via ORAL
  Filled 2024-06-29 (×6): qty 325

## 2024-06-29 MED ORDER — DULOXETINE HCL 30 MG PO CPEP
30.0000 mg | ORAL_CAPSULE | ORAL | Status: AC
  Administered 2024-06-30 – 2024-07-02 (×2): 30 mg via ORAL
  Filled 2024-06-29 (×2): qty 1

## 2024-06-29 MED ORDER — ATORVASTATIN CALCIUM 10 MG PO TABS
20.0000 mg | ORAL_TABLET | Freq: Every day | ORAL | Status: AC
  Administered 2024-06-30 – 2024-07-02 (×3): 20 mg via ORAL
  Filled 2024-06-29 (×3): qty 2

## 2024-06-29 NOTE — Progress Notes (Signed)
 Occupational Therapy Session Note  Patient Details  Name: Levi [name] MRN: [id number] Date of Birth: March 05, 1946  Today's Date: 06/29/2024 OT Individual Time: 0801-0900 OT Individual Time Calculation (min): 59 min    Short Term Goals: Week 1:  OT Short Term Goal 1 (Week 1): Pt will complete LB dressing with MOD A using AD PRN OT Short Term Goal 2 (Week 1): Pt will complete toileting with MOD A using AD/LRAD PRN OT Short Term Goal 3 (Week 1): Pt will maintain dynamic sitting balance during ADL with CGA OT Short Term Goal 4 (Week 1): Pt will complete toilet transfer with MIN A  Skilled Therapeutic Interventions/Progress Updates:   Patient agreeable to participate in OT session. Reports 0/10 pain level. Patient reporting increased fatigue during functional activities during session today. Patient received in room ready for OT with nursing. Patient participated in skilled OT session focusing on functional dressing, bathing, and slide board transfers. Patient requested to complete bathing session today. Slide board to chair min A. Chair to shower chair mod A with verbal cueing on positioning and use. OT educated on compensatory techniques, mobility, pursed lip breathing, energy conservation. Completed short bathing session with mod A to max A for posterior hygiene. Completed transfer back to chair max A d.t fatigue. Completed LB threading in chair min to mod A, pulling up pants max A. UB dressing CGA. Returned to bed all needs in reach.  Vitals remained normal range during session   Therapy Documentation Precautions:  Precautions Precautions: Fall Recall of Precautions/Restrictions: Intact Precaution/Restrictions Comments: acute compression deformities T6-T7; h/o R BKA with wounds on distal aspect of limb Restrictions Weight Bearing Restrictions Per Provider Order: Yes RLE Weight Bearing Per Provider Order: Non weight bearing   Therapy/Group: Individual Therapy  D'mariea L Chariah Bailey 06/29/2024,  10:36 AM

## 2024-06-29 NOTE — Progress Notes (Signed)
 Patient ID: Levi [name], male   DOB: 22-Nov-1945, 79 y.o.   MRN: [id number]  Met with patient to discuss discharge options for him. He informed SW that he will do anything to ensure the safety and health of his wife.   Spoke with patient's wife, Desman Polak along with PA, Sharlet Schmitz. [name] broke her left wrist. She has been working diligently to ensure that her husband will be taken care once he discharges. She is potentially looking at placement and is aware of insurance coverage.   Mr. Jeansonne will benefit from the maximum amount of stay days in [facility] as we plan a safe discharge for him.

## 2024-06-29 NOTE — Plan of Care (Signed)
" °  Problem: Consults Goal: RH GENERAL PATIENT EDUCATION Description: See Patient Education module for education specifics. Outcome: Progressing   Problem: RH BOWEL ELIMINATION Goal: RH STG MANAGE BOWEL WITH ASSISTANCE Description: STG Manage Bowel with min Assistance. Outcome: Progressing   Problem: RH BLADDER ELIMINATION Goal: RH STG MANAGE BLADDER WITH ASSISTANCE Description: STG Manage Bladder With min Assistance Outcome: Progressing   Problem: RH SKIN INTEGRITY Goal: RH STG SKIN FREE OF INFECTION/BREAKDOWN Description: Manage skin free of infection/breakdown with min assistance Outcome: Progressing   Problem: RH SAFETY Goal: RH STG ADHERE TO SAFETY PRECAUTIONS W/ASSISTANCE/DEVICE Description: STG Adhere to Safety Precautions With Assistance/Device. Outcome: Progressing   Problem: RH PAIN MANAGEMENT Goal: RH STG PAIN MANAGED AT OR BELOW PT'S PAIN GOAL Description: <4 w/ prns Outcome: Progressing   Problem: RH KNOWLEDGE DEFICIT GENERAL Goal: RH STG INCREASE KNOWLEDGE OF SELF CARE AFTER HOSPITALIZATION Description: Manage increase knowledge of self care after hospitalization with min assistance from spouse using educational materials provided Outcome: Progressing   "

## 2024-06-30 ENCOUNTER — Inpatient Hospital Stay (HOSPITAL_COMMUNITY)

## 2024-06-30 DIAGNOSIS — I5033 Acute on chronic diastolic (congestive) heart failure: Secondary | ICD-10-CM | POA: Diagnosis not present

## 2024-06-30 DIAGNOSIS — R5381 Other malaise: Secondary | ICD-10-CM | POA: Diagnosis not present

## 2024-06-30 DIAGNOSIS — D72829 Elevated white blood cell count, unspecified: Secondary | ICD-10-CM | POA: Diagnosis not present

## 2024-06-30 DIAGNOSIS — I959 Hypotension, unspecified: Secondary | ICD-10-CM | POA: Diagnosis not present

## 2024-06-30 DIAGNOSIS — E876 Hypokalemia: Secondary | ICD-10-CM | POA: Diagnosis not present

## 2024-06-30 DIAGNOSIS — J189 Pneumonia, unspecified organism: Secondary | ICD-10-CM | POA: Diagnosis not present

## 2024-06-30 LAB — CBC
HCT: 35.1 % — ABNORMAL LOW (ref 39.0–52.0)
Hemoglobin: 11.1 g/dL — ABNORMAL LOW (ref 13.0–17.0)
MCH: 27.8 pg (ref 26.0–34.0)
MCHC: 31.6 g/dL (ref 30.0–36.0)
MCV: 88 fL (ref 80.0–100.0)
Platelets: 231 10*3/uL (ref 150–400)
RBC: 3.99 MIL/uL — ABNORMAL LOW (ref 4.22–5.81)
RDW: 16 % — ABNORMAL HIGH (ref 11.5–15.5)
WBC: 9 10*3/uL (ref 4.0–10.5)
nRBC: 0 % (ref 0.0–0.2)

## 2024-06-30 LAB — BASIC METABOLIC PANEL WITH GFR
Anion gap: 9 (ref 5–15)
BUN: 33 mg/dL — ABNORMAL HIGH (ref 8–23)
CO2: 28 mmol/L (ref 22–32)
Calcium: 9 mg/dL (ref 8.9–10.3)
Chloride: 102 mmol/L (ref 98–111)
Creatinine, Ser: 0.83 mg/dL (ref 0.61–1.24)
GFR, Estimated: >60 mL/min
Glucose, Bld: 80 mg/dL (ref 70–99)
Potassium: 3.8 mmol/L (ref 3.5–5.1)
Sodium: 138 mmol/L (ref 135–145)

## 2024-06-30 LAB — GLUCOSE, CAPILLARY
Glucose-Capillary: 84 mg/dL (ref 70–99)
Glucose-Capillary: 165 mg/dL — ABNORMAL HIGH (ref 70–99)

## 2024-06-30 MED ORDER — SULFAMETHOXAZOLE-TRIMETHOPRIM 400-80 MG PO TABS
1.0000 | ORAL_TABLET | Freq: Every day | ORAL | Status: AC
  Administered 2024-07-01 – 2024-07-02 (×2): 1 via ORAL
  Filled 2024-06-30 (×3): qty 1

## 2024-06-30 MED ORDER — AMOXICILLIN-POT CLAVULANATE 875-125 MG PO TABS
1.0000 | ORAL_TABLET | Freq: Two times a day (BID) | ORAL | Status: AC
  Administered 2024-06-30 – 2024-07-02 (×6): 1 via ORAL
  Filled 2024-06-30 (×6): qty 1

## 2024-06-30 MED ORDER — DILTIAZEM HCL 60 MG PO TABS
60.0000 mg | ORAL_TABLET | Freq: Three times a day (TID) | ORAL | Status: AC
  Administered 2024-07-01 – 2024-07-02 (×6): 60 mg via ORAL
  Filled 2024-06-30 (×7): qty 1

## 2024-06-30 NOTE — Patient Care Conference (Signed)
 Inpatient RehabilitationTeam Conference and Plan of Care Update Date: 06/30/2024   Time: 9:42 AM   Patient was admitted after the interdisciplinary team meeting took place on 06/23/2024; as a result, the first team conference occurred on day 8 of this patient's stay.    Patient Name: Levi [name]      Medical Record Number: [id number]  Date of Birth: 1946/03/22 Sex: Male         Room/Bed: 4M04C/4M04C-01 Payor Info: Payor: MEDICARE / Plan: MEDICARE PART A AND B / Product Type: *No Product type* /    Admit Date/Time:  06/23/2024 11:20 PM  Primary Diagnosis:  Debility  Hospital Problems: Principal Problem:   Debility    Expected Discharge Date: Expected Discharge Date: 07/08/24  Team Members Present: Physician leading conference: Dr. Murray Collier Social Worker Present: Waverly Gentry, LCSW-A Nurse Present: Barnie Ronde, RN PT Present: Izetta Seip, PT OT Present: Michaelyn Seip, OT PPS Coordinator present : Eleanor Colon, SLP     Current Status/Progress Goal Weekly Team Focus  Bowel/Bladder   Continent of bowel and bladder   Continent of bowel and bladder   Toileting assistance;  constipation addressed    Swallow/Nutrition/ Hydration               ADL's   LB mod to max, transfers mod, UB min   overall min A   barriers: endurance, ROM R shoulder, strength, balance, skin integrity.  D/C to SNF pending ??    Mobility   S/++ time for bed mobility; min to mod for transfers, S for short distance WC mob   mod I for bed mob; currently set at S for transfers but may downgrade to CGA; discontinued all standing/amb goals  barriers: endurance, R shoulder pain/ROM/strength, B LE strength, wounds/skin integrity. DC home vs SNF    Communication                Safety/Cognition/ Behavioral Observations               Pain   pt has no c/o pain   Pain < 4 with prns   Assess pain and need for Prn medications    Skin   Skin tear to Left arm   pt will leave with skin  intact and no S/S of infection  assess skin per shift; address skin needs      Discharge Planning:  Home vs SNF. Patient's spouse, [name] recently broke her wrist and will be unable to provide full care for patient as she was previously. Has DME from previous hospitalization. Had [name] for HH. Will await therapy follow-up recommendations.   Team Discussion: Patient admitted with cardiac debility, acute on chronic diastolic heart failure with orthostasis. Functional progress limited by chronic shoulder pain and decreased ROM in right shoulder with fatigue and poor endurance; requiring extra time to complete tasks.  Patient on target to meet rehab goals: yes, currently needs supervision for ADLs; seated on edge of the bed with extra time due to shortness of breath with exertion. Working on lawyer.  Needs min - mod assist for transfers and able to propel wheelchair with supervision.  *See Care Plan and progress notes for long and short-term goals.   Revisions to Treatment Plan:  Downgraded goals to CGA and discontinued standing goal; changed to wheelchair goal at discharge   Teaching Needs: Safety, medications, transfers, toileting, etc.   Current Barriers to Discharge: Decreased caregiver support  Possible Resolutions to Barriers: Family education [facility] follow  up services Has DME     Medical Summary Current Status: shortness of breath, hypotension, leukocytosis, anemia,  Barriers to Discharge: Cardiac Complications;Electrolyte abnormality;Medical stability;Self-care education  Barriers to Discharge Comments: shortness of breath, hypotension, leukocytosis, anemia,fatigue, hyypokalemia, caregiver-wife fractured her wrist Possible Resolutions to [employer] Focus: EKG, CXR, possible increase lasix    Continued Need for Acute Rehabilitation Level of Care: The patient requires daily medical management by a physician with specialized training in physical medicine  and rehabilitation for the following reasons: Direction of a multidisciplinary physical rehabilitation program to maximize functional independence : Yes Medical management of patient stability for increased activity during participation in an intensive rehabilitation regime.: Yes Analysis of laboratory values and/or radiology reports with any subsequent need for medication adjustment and/or medical intervention. : Yes   I attest that I was present, lead the team conference, and concur with the assessment and plan of the team.   Fredericka Sober B 06/30/2024, 1:30 PM

## 2024-06-30 NOTE — Progress Notes (Signed)
 Occupational Therapy Session Note  Patient Details  Name: Levi [name] MRN: [id number] Date of Birth: 1945/10/26   Today's Date: 06/30/2024 OT Individual Time: 9340-9191 OT Individual Time Calculation (min): 69 min    Short Term Goals: Week 1:  OT Short Term Goal 1 (Week 1): Pt will complete LB dressing with MOD A using AD PRN OT Short Term Goal 2 (Week 1): Pt will complete toileting with MOD A using AD/LRAD PRN OT Short Term Goal 3 (Week 1): Pt will maintain dynamic sitting balance during ADL with CGA OT Short Term Goal 4 (Week 1): Pt will complete toilet transfer with MIN A  Skilled Therapeutic Interventions/Progress Updates:  Patient agreeable to participate in OT session. Reports 5/10 pain level in R shoulder. Patient having severe complaints of shortness of breath with every 2-3 minutes of activity. Patient received in bed ready for OT.   Patient participated in skilled OT session focusing on toileting, bathing, dressing, activity tolerance. Patient requesting to complete toileting. OT gathered required items, placed slide board under patient. Patient then able to slide to [facility] with CGA with shoe donned. Patient mod A for clothing management, SUP for peri hygiene. Patient able to wash UB, LB with min to mod on BSC, assist for lower L leg. Patient then able to complete LB dressing max A due to increased fatigue, UB dressing SUP with increased time. Patient then returned to wc with max A via slide board. Reported shortness of breath to nursing to monitor, left in chair all needs in reach. Reported to MD about shortness of breath and weakness.      Therapy Documentation Precautions:  Precautions Precautions: Fall Recall of Precautions/Restrictions: Intact Precaution/Restrictions Comments: acute compression deformities T6-T7; h/o R BKA with wounds on distal aspect of limb Restrictions Weight Bearing Restrictions Per Provider Order: Yes RLE Weight Bearing Per Provider Order: Non weight  bearing  Therapy/Group: Individual Therapy  D'mariea L Mayleigh Tetrault 06/30/2024, 7:37 AM

## 2024-06-30 NOTE — Progress Notes (Signed)
 Orthopedic Tech Progress Note Patient Details:  Levi [name] 1945/08/23 [id number]  Ortho Devices Type of Ortho Device: Abdominal binder Ortho Device/Splint Location: Delivered to bedside Ortho Device/Splint Interventions: Ordered   Post Interventions Patient Tolerated: Well Instructions Provided: Care of device, Adjustment of device  Levi [name] [name] 06/30/2024, 10:39 AM

## 2024-06-30 NOTE — Progress Notes (Signed)
 Patient ID: Levi [name], male   DOB: 28-Dec-1945, 79 y.o.   MRN: [id number]  Have reviewed team conference with pt and family. Both aware and agreeable with targeted d/c date of 02/12 and goals of min/A.   [name] has fractured her wrist and is thinking about other options. She has an ortho appointment coming up. We will discuss other options following her appointment.

## 2024-06-30 NOTE — Progress Notes (Signed)
 Recreational Therapy Session Note  Patient Details  Name: Levi [name] MRN: [id number] Date of Birth: [birth year] Today's Date: 06/30/2024  Pain: no c/o Pt participated in animal assisted activity bed level with supervision. Pt easily engaged with pet partner team and was appreciative of this visit.  Casmira Cramer 06/30/2024, 4:01 PM

## 2024-06-30 NOTE — Progress Notes (Signed)
 Physical Therapy Session Note  Patient Details  Name: Levi [name] MRN: [id number] Date of Birth: 10-28-1945  Today's Date: 06/30/2024 PT Missed Time: 60 Minutes Missed Time Reason: Other (Comment);Patient fatigue (SOB/dizziness)  Short Term Goals: Week 1:  PT Short Term Goal 1 (Week 1): pt will perform bed mobility with CGA overall PT Short Term Goal 2 (Week 1): pt will transfer bed<>chair with LRAD and CGA PT Short Term Goal 3 (Week 1): pt will perform sit<>stand with LRAD and mod A  Skilled Therapeutic Interventions/Progress Updates:     Pt semi-reclined in bed, nursing assessing vitals, upon arrival. Pt stated he was unable to participate in PT due to dizziness/fatigue/SOB and also wishes to wait until CXR and EKG results are back. Pt missed 60 min treatment time - plan to make up as able.  Therapy Documentation Precautions:  Precautions Precautions: Fall Recall of Precautions/Restrictions: Intact Precaution/Restrictions Comments: acute compression deformities T6-T7; h/o R BKA with wounds on distal aspect of limb Restrictions Weight Bearing Restrictions Per Provider Order: Yes RLE Weight Bearing Per Provider Order: Non weight bearing  Therapy/Group: Individual Therapy  [name], PT, DPT 06/30/2024, 7:35 AM

## 2024-06-30 NOTE — Progress Notes (Signed)
 Occupational Therapy Session Note  Patient Details  Name: Levi [name] MRN: [id number] Date of Birth: Sep 27, 1945  Today's Date: 06/30/2024 OT Individual Time: 1436-1450( session 2)  OT Individual Time Calculation (min): 14 min  16 mins missed d/t fatigue and SOB   OT Individual Time: [phone]( session 1)  OT Individual Time Calculation (min): 35 min  25 mins missed for nursing care, EKG, and chest xray  Short Term Goals: Week 1:  OT Short Term Goal 1 (Week 1): Pt will complete LB dressing with MOD A using AD PRN OT Short Term Goal 2 (Week 1): Pt will complete toileting with MOD A using AD/LRAD PRN OT Short Term Goal 3 (Week 1): Pt will maintain dynamic sitting balance during ADL with CGA OT Short Term Goal 4 (Week 1): Pt will complete toilet transfer with MIN A  Skilled Therapeutic Interventions/Progress Updates:  Session 1: Pt greeted seated in w/c, pt agreeable to OT intervention.    Nurse reports SOB prior to session,  SpO2 98% on RA, HR 100 bpm at start of session. No true pain reported.   Transfers/bed mobility/functional mobility:  Dependent transfer into gym for energy conservation.   Therapeutic activity:  Pt completed seated endurance task with 4lb weighted donned on LUE only ( RUE has rotator cuff tear) with pt instructed to lean forward to place bean bags in basketball goal with a focus on improved acitivy tolerance for ADL participation and to improve posterior pelvic tilt.. Pt completed task for ~ 30 secs and then needed to rest. SPO2 WFL but noted increased WOB. Nurse then entered during session and requested pt to return to room d/t pending chest xray and EKG.   Pt completed slide board transfer back to bed with MIN A d/t increased fatigue and SOB, pt was able to place board with set- up assist.   MOD A to return to supine to manage BLEs, pt did assist with pulling pt up in bed with LUE reaching over head for bed rail.   Ended up missing 25 mins d/t pending tests.    Ended session with pt supine in bed with all needs within reach and bed alarm activated.                        Session 2: Pt greeted supine in bed, no indications of pain noted.   Wife present. Pt reports still feeling ill with continued SOB. Wife asking to speak to PA about results of EKG and chest xray, alerted nurse. Wife asking appropriate questions about DC home vs going SNF. Provided education on processing of seeking SNF placement and alerted SW to answer remaining questions.  Pt missed 16 mins of session d/t SOB and fatigue.    Ended session with pt supine in bed with all needs within reach and bed alarm activated.                    Therapy Documentation Precautions:  Precautions Precautions: Fall Recall of Precautions/Restrictions: Intact Precaution/Restrictions Comments: acute compression deformities T6-T7; h/o R BKA with wounds on distal aspect of limb Restrictions Weight Bearing Restrictions Per Provider Order: No RLE Weight Bearing Per Provider Order: Non weight bearing     Therapy/Group:   Levi [name] 06/30/2024, 3:05 PM

## 2024-06-30 NOTE — Progress Notes (Signed)
 Nutrition Follow Up  DOCUMENTATION CODES:   Not applicable  INTERVENTION:  Encouraged adequate intake of meals and supplements to meet increased protein and calorie needs for wound healing Pt does not like Levi [name] [name], requests to be discontinued Can add Ensure as pt eats dairy  Continue Juven BID, each packet provides 95 calories, 2.5 grams of protein (collagen), and 9.8 grams of carbohydrate (3 grams sugar); also contains 7 grams of L-arginine and L-glutamine, 300 mg vitamin C , 15 mg vitamin E, 1.2 mcg vitamin B-12, 9.5 mg zinc , 200 mg calcium , and 1.5 g  Calcium  Beta-hydroxy-Beta-methylbutyrate to support wound healing  Continue ProSource Plus BID, each supplement provides 100 kcal and 15 g protein  Continue cup TID with meals, each supplement provides 290 kcal and 9 grams of protein  *pt is not vegetarian just avoids animal meat; eats fish, eggs, dairy*  NUTRITION DIAGNOSIS:   Inadequate protein intake related to other (see comment) (pt avoids animal meat) as evidenced by per patient/family report, moderate muscle depletion. Remains applicable  GOAL:   Patient will meet greater than or equal to 90% of their needs Progressing  MONITOR:   PO intake, Supplement acceptance  REASON FOR ASSESSMENT:   Diagnosis (Malnutrition, BKA)    ASSESSMENT:   Pt with hx of COPD, seizures, cellulitis, osteomyelitis s/p R BKA, and agent orange exposure. Recent admission with SOB and acute exacerbation of heart failure. Admitted to CIR to work on debility.  Pt resting in bed at time of follow up. Appetite has improved since last assessment, eating on average 49% of meals. Pt accepting of all ONS including Juven, ProSource, and magic cups. Suspect intake in combination with ONS is meeting pt's nutrition needs. Will continue interventions. Encouraged pt to continue to work on appetite at meals. Pt does not like [employer] supplement and requests it to be discontinued.  Weight has remained  stable throughout admission. Pt getting chest xray and EKG done today, reports frequent SOB with therapies. Otherwise, pt states therapy has been going well.   No GI discomforts reported at this time. Was having diarrhea, has resolved with psyllium intake.  Average Meal Completion: 1/29: 25% average intake x 1 recorded meals 2/1-2/4: 49% average intake x 8 recorded meals  Nutritionally Relevant Medications: ProSource Plus 30 ml BID Risaquad TID Vitamin C  500 mg daily Ferrous gluconate  daily Lasix   Juven BID Protonix  BID Prednisone   Psyllium  Spironolactone    Labs reviewed: Potassium 3.8 (WNL) CBG x 24 h: 72-87 mg/dL No recent J8r  Admit weight: 81.5 kg Current weight: 81.4 kg   Diet Order:   Diet Order             Diet regular Room service appropriate? Yes with Assist; Fluid consistency: Thin  Diet effective now                   EDUCATION NEEDS:   Education needs have been addressed  Skin:  Skin Assessment: Skin Integrity Issues: Skin Integrity Issues:: Incisions, Other (Comment) Incisions: R BKA Other: wounds: L leg  Last BM:  2/3 type 3  Height:   Ht Readings from Last 1 Encounters:  06/24/24 6' 0.99 (1.854 m)    Weight:   Wt Readings from Last 1 Encounters:  06/28/24 81.4 kg    Ideal Body Weight:  78.7 kg  BMI:  Body mass index is 23.68 kg/m.  Estimated Nutritional Needs:   Kcal:  1800-2000  Protein:  95-110  Fluid:  1.8-2L    Levi [name]  [name], MS, RDN, LDN Clinical Dietitian I Please reach out via secure chat

## 2024-06-30 NOTE — Progress Notes (Signed)
 "                                                        PROGRESS NOTE   Subjective/Complaints:  Pt had EKG- SNR and CXR completed this AM for increased SOB. He denies CP.   ROS: as per HPI. Denies CP,  abd pain, N/V/D/C, or any other complaints at this time.    Chronic right shoulder pain-unchanged +dizziness-improved +SOB- increased with activity   Objective:   DG CHEST PORT 1 VIEW Result Date: 06/30/2024 EXAM: 1 VIEW(S) XRAY OF THE CHEST 06/30/2024 09:30:00 AM COMPARISON: 06/29/2024 CLINICAL HISTORY: Shortness of breath. FINDINGS: LUNGS AND PLEURA: Low lung volumes. Bibasilar patchy opacities. Possible trace left pleural effusion. No pneumothorax. HEART AND MEDIASTINUM: No acute abnormality of the cardiac and mediastinal silhouettes. BONES AND SOFT TISSUES: Partially imaged left shoulder reverse arthroplasty. Cervical spinal fusion hardware in place. Old left rib fractures. No acute osseous abnormality. IMPRESSION: 1. Bibasilar patchy opacities, which may reflect atelectasis or infection. 2. Possible trace left pleural effusion. Electronically signed by: Norleen Boxer MD 06/30/2024 09:49 AM EST RP Workstation: [id number]   DG Chest 2 View Result Date: 06/29/2024 CLINICAL DATA:  Shortness of breath. EXAM: CHEST - 2 VIEW COMPARISON:  03/26/2025, CT 06/19/2024 FINDINGS: Lung volumes are low. Slight increase in patchy opacity at the right lung base. Left basilar atelectasis is unchanged. Continued improvement in interstitial edema. Suspected small persistent pleural effusions. No pneumothorax. IMPRESSION: 1. Slight increase in patchy opacity at the right lung base. 2. Continued improvement in interstitial edema. 3. Suspected small persistent pleural effusions. Electronically Signed   By: Andrea Gasman M.D.   On: 06/29/2024 20:35    Recent Labs    06/28/24 0414 06/30/24 0519  WBC 10.7* 9.0  HGB 11.0* 11.1*  HCT 34.2* 35.1*  PLT 217 231   Recent Labs    06/28/24 0414 06/30/24 0519   NA 138 138  K 3.5 3.8  CL 101 102  CO2 24 28  GLUCOSE 86 80  BUN 28* 33*  CREATININE 0.70 0.83  CALCIUM  8.8* 9.0    Intake/Output Summary (Last 24 hours) at 06/30/2024 1059 Last data filed at 06/30/2024 0936 Gross per 24 hour  Intake 709 ml  Output 925 ml  Net -216 ml        Physical Exam: Vital Signs Blood pressure 90/65, pulse (!) 102, temperature (!) 97.3 F (36.3 C), resp. rate (!) 22, height 6' 0.99 (1.854 m), weight 81.4 kg, SpO2 100%.   General: No apparent distress, laying in bed, appears comfortable HEENT: Head is normocephalic, atraumatic, sclera anicteric, oral mucosa a little dry Neck: Supple without JVD or lymphadenopathy Heart: Reg rate and rhythm. No murmurs rubs or gallops Chest: mild rales at bases, non-labored, no wheezing, on RA Abdomen: Soft, non-tender, non-distended, bowel sounds positive. Extremities: R BKA, trace LLE edema  Psych: Pt's affect is appropriate. Pt is cooperative. Very pleasant Skin:  Right BKA with evidence of two small healed abrasions. Tibial tuberosity with pressure areas, petechiae right inner thigh. Open area (appears to have 1 cm depth) right lateral BKA site around fibular head-covered in dry dressing-- all covered with mepilex now  Dry flaky paper thin skin with multiple ecchymotic areas and healing skin tears on four limbs. Hematomas on left shin anterior and posteriorly.  Buttocks red with small open area on top of gluteal cleft-- not reassessed today   Neuro:     Mental Status: AAOx4 Speech/Languate: Naming and repetition intact, fluent, follows simple commands CRANIAL NERVES: 2-12 grossly intact     MOTOR: RUE: 1-2/5 Deltoid, 4/5 Biceps, 4/5 Triceps,4/5 Grip LUE: 4/5 Deltoid, 4/5 Biceps, 4/5 Triceps, 4/5 Grip RLE: HF 4/5, KE 4/5 LLE: HF 4/5, KE 4/5, ADF 2-3/5, APF 3/5   SENSORY: Altered to LT in b/l UE and LLE   MSK: decreased PROM R shoulder- chronic                  Assessment/Plan: 1.  Functional deficits which require 3+ hours per day of interdisciplinary therapy in a comprehensive inpatient rehab setting. Physiatrist is providing close team supervision and 24 hour management of active medical problems listed below. Physiatrist and rehab team continue to assess barriers to discharge/monitor patient progress toward functional and medical goals  Care Tool:  Bathing    Body parts bathed by patient: Right arm, Left arm, Chest, Abdomen, Front perineal area, Face   Body parts bathed by helper: Buttocks, Right upper leg, Left upper leg, Left lower leg, Left arm     Bathing assist Assist Level: Maximal Assistance - Patient 24 - 49%     Upper Body Dressing/Undressing Upper body dressing   What is the patient wearing?: Pull over shirt    Upper body assist Assist Level: Moderate Assistance - Patient 50 - 74%    Lower Body Dressing/Undressing Lower body dressing      What is the patient wearing?: Pants, Underwear/pull up     Lower body assist Assist for lower body dressing: Maximal Assistance - Patient 25 - 49%     Toileting Toileting    Toileting assist Assist for toileting: Total Assistance - Patient < 25%     Transfers Chair/bed transfer  Transfers assist  Chair/bed transfer activity did not occur: Safety/medical concerns  Chair/bed transfer assist level: Moderate Assistance - Patient 50 - 74%     Locomotion Ambulation   Ambulation assist   Ambulation activity did not occur: Safety/medical concerns (weakness, deconditioning, condition of contralateral leg)          Walk 10 feet activity   Assist  Walk 10 feet activity did not occur: Safety/medical concerns (weakness, deconditioning, condition of contralateral leg)        Walk 50 feet activity   Assist Walk 50 feet with 2 turns activity did not occur: Safety/medical concerns (weakness, deconditioning, condition of contralateral leg)         Walk 150 feet activity   Assist Walk  150 feet activity did not occur: Safety/medical concerns (weakness, deconditioning, condition of contralateral leg)         Walk 10 feet on uneven surface  activity   Assist Walk 10 feet on uneven surfaces activity did not occur: Safety/medical concerns (weakness, deconditioning, condition of contralateral leg)         Wheelchair     Assist Is the patient using a wheelchair?: Yes Type of Wheelchair: Manual Wheelchair activity did not occur: Safety/medical concerns (weakness, deconditioning)    Max wheelchair distance: 86'    Wheelchair 50 feet with 2 turns activity    Assist    Wheelchair 50 feet with 2 turns activity did not occur: Safety/medical concerns (weakness, deconditioning)   Assist Level: Supervision/Verbal cueing   Wheelchair 150 feet activity     Assist  Wheelchair 150 feet activity did not  occur: Safety/medical concerns (weakness, deconditioning)   Assist Level: Maximal Assistance - Patient 25 - 49%   Blood pressure 90/65, pulse (!) 102, temperature (!) 97.3 F (36.3 C), resp. rate (!) 22, height 6' 0.99 (1.854 m), weight 81.4 kg, SpO2 100%.   Medical Problem List and Plan: 1. Functional deficits secondary to Debility due to CHF with hx of R BKA             -patient may shower             -ELOS/Goals: 10-14 days, min A PT/OT             -Continue CIR  -Pt wife broke wrist-may cause difficulty for her to care for him after DC  -Team conference today please see physician documentation under team conference tab, met with team  to discuss problems,progress, and goals. Formulized individual treatment plan based on medical history, underlying problem and comorbidities.   2.  Antithrombotics: -DVT/anticoagulation:  Pharmaceutical: Eliquis  5mg  BID             -antiplatelet therapy: N/A 3. Pain Management: Tylenol  prn for mild and oxycodone  prn for severe pain.  Chronic R shoulder injury.  4. Mood/Behavior/Sleep: LCSW to follow for evaluation  and support.              -antipsychotic agents: N/A  -Trazodone  PRN 5. Neuropsych/cognition: This patient is capable of making decisions on his own behalf. 6. Skin/Wound Care: Routine pressure relief 7. Fluids/Electrolytes/Nutrition: Monitor I/O. Monitor labs. Continue vitamins             --[employer] added for  nutritional supplement/wound healing.  8. Acute on chronic diastolic CHF: Strict I/O with daily weights.  -on Losartan  (held?), Spironolactone  12.5mg  daily, lasix  40mg  daily and Lipitor 20mg  daily.   -06/26/24 no signs of volume overload noted, wt stable; of note, Lasix  d/c'd as of yesterday d/t Cr, but Cr today improved at 0.6. Will restart lasix .  -2/2 decrease lasix  to 20mg  starting tomorrow -2/3-4 Wt appears stable, discussed lasix  at lower dose. -CXR not indicating significant fluid overload   Filed Weights   06/25/24 0408 06/26/24 0407 06/28/24 0438  Weight: 81.3 kg 81.4 kg 81.4 kg    9. Hypokalemia: Monitor for recurrence with Lasix  on board. K+ 4.3 on 1/28  -1/29 K+ 3.6 monitor  -06/26/24 K 3.3, will replete with 40mEq x1 dose, recheck Monday  -2/2 K+ 3.5, will give 20meq x1 for suboptimal level  Recheck tomorrow  10.  PAF: Monitor HR TID--on diltiazem  180mg  daily and Apixaban .  -HR stable 11.  Asthma/COPD: Continue chronic steroids--prednisone  15 mg with MDI. Tesslon perles 100mg  TID and Robitussin 10ml TID.  -On Bactrim  while on prednisone , reviewed with pharmacy- appears was on daily not BID, will adjust to this frequency.  12. GERD: Managed on Protonix  40mg  BID and acidophilus 13.  Hx of Seizures: Controlled on Lamotrigine  25mg  daily 14. BPH: Continue Flomax  0.4mg  nightly. Continent. 15. Neuropathy: On Lyrica  75mg  daily and cymbalta  30mg  3x/wk.  16. Thrombocytopenia: Boderline low and likely reactive. Recheck platelets in am.  --Monitor for signs of bleeding  -1/29 PLT 165 improved, monitor--> 180 1/31 17. Buttock pain: Reports severe with certain  movements. EPC cream with foam dressing.  --Hx of sacral decub--Buttocks red with small open area on top of gluteal cleft --Will order air mattress for pressure relief and to prevent breakdown.  18. Anemia of chronic disease?: Will order anemia panel. Continue Ferrous gluconate  324mg  daily  -  1/29 HGB stable 11  -Ferritin normal, likely anemia of chronic disease  -2/4 HGB stable at 11.1 19. HLD: continue Lipitor 20mg  daily 20. Leukocytosis-mild   -no signs/symptoms infection noted, monitor   -1/30 recheck today, no signs of symptoms of infection noted -1/30 addendum,  WBC higher at 12.1. Recheck tomorrow. Will add CXR for tomorrow. Consider U/A if not improving.  -06/26/24 stable WBC 12.1, no fever/infectious symptoms, CXR pending. Monitor for now -06/27/24 CXR neg for acute findings, no fevers overnight, monitor -2/2 improved to 10.7, monitor trend, no symptoms/signs of infection -2/4 improved today, WBC 9.0 21. Pt reports hx of B12/Vit D supplementation before admission. B12 level was 3,166.  -check D with next labs 22.  Hypotension, asymptomatic   - Ordered orthostatic vital signs-pending -1/30 addendum,  BP improved but BUN/Cr noted to be higher- hold lasix  40mg  daily-recheck tomorrow -06/26/24 improved Cr 0.6, restarting lasix  as above, BP doing well -06/27/24 BP stable, monitor with resumption of lasix  -2/2 Compression for LLE, abd binder PRN. Decrease lasix . BUN still a little elevated at 28-although improved -2/3 Monitor how he does with therapy today, reports dizziness improved this AM -2/4 Ask nursing to order abd binder, continue LE compression    Vitals:   06/27/24 1432 06/27/24 2126 06/28/24 0610 06/28/24 0916  BP: 120/76 130/81 117/63 107/74   06/28/24 1317 06/28/24 2118 06/29/24 0630 06/29/24 1541  BP: 97/68 118/72 (!) 109/55 94/62   06/29/24 2023 06/30/24 0404 06/30/24 0808 06/30/24 0856  BP: 106/76 120/71 (!) 136/116 90/65     23. Loose stools: added metamucil 06/26/24,  on acidophilus. No frank diarrhea.  24. Shortness of breath.  07/01/23 Bibasilar patchy opacities on Xray. WBC is improved today. He is higher risk of infection given chronic prednisone . Will start augmentin  BID for 5 days for possible pneumonia.    LOS: 7 days A FACE TO FACE EVALUATION WAS PERFORMED  Murray Collier 06/30/2024, 10:59 AM     "

## 2024-07-01 LAB — GLUCOSE, CAPILLARY
Glucose-Capillary: 95 mg/dL (ref 70–99)
Glucose-Capillary: 138 mg/dL — ABNORMAL HIGH (ref 70–99)

## 2024-07-01 LAB — CBC
HCT: 36.7 % — ABNORMAL LOW (ref 39.0–52.0)
Hemoglobin: 11.5 g/dL — ABNORMAL LOW (ref 13.0–17.0)
MCH: 27.3 pg (ref 26.0–34.0)
MCHC: 31.3 g/dL (ref 30.0–36.0)
MCV: 87.2 fL (ref 80.0–100.0)
Platelets: 244 10*3/uL (ref 150–400)
RBC: 4.21 MIL/uL — ABNORMAL LOW (ref 4.22–5.81)
RDW: 16.3 % — ABNORMAL HIGH (ref 11.5–15.5)
WBC: 9.3 10*3/uL (ref 4.0–10.5)
nRBC: 0 % (ref 0.0–0.2)

## 2024-07-01 LAB — BASIC METABOLIC PANEL WITH GFR
Anion gap: 11 (ref 5–15)
BUN: 27 mg/dL — ABNORMAL HIGH (ref 8–23)
CO2: 27 mmol/L (ref 22–32)
Calcium: 9 mg/dL (ref 8.9–10.3)
Chloride: 101 mmol/L (ref 98–111)
Creatinine, Ser: 0.86 mg/dL (ref 0.61–1.24)
GFR, Estimated: >60 mL/min
Glucose, Bld: 88 mg/dL (ref 70–99)
Potassium: 3.2 mmol/L — ABNORMAL LOW (ref 3.5–5.1)
Sodium: 138 mmol/L (ref 135–145)

## 2024-07-01 MED ORDER — POTASSIUM CHLORIDE CRYS ER 10 MEQ PO TBCR
10.0000 meq | EXTENDED_RELEASE_TABLET | Freq: Every day | ORAL | Status: AC
  Administered 2024-07-02: 10 meq via ORAL
  Filled 2024-07-01: qty 1

## 2024-07-01 MED ORDER — HYDROCERIN EX CREA: 1.0000 | TOPICAL_CREAM | Freq: Two times a day (BID) | CUTANEOUS | Status: AC

## 2024-07-01 MED ORDER — POTASSIUM CHLORIDE CRYS ER 20 MEQ PO TBCR
30.0000 meq | EXTENDED_RELEASE_TABLET | Freq: Two times a day (BID) | ORAL | Status: AC
  Administered 2024-07-01 (×2): 30 meq via ORAL
  Filled 2024-07-01 (×2): qty 1

## 2024-07-01 NOTE — Progress Notes (Signed)
 Physical Therapy Session Note  Patient Details  Name: Levi [name] MRN: [id number] Date of Birth: 29-May-1945  Today's Date: 07/01/2024 PT Individual Time: 1000-1058 PT Individual Time Calculation (min): 58 min   Short Term Goals: Week 1:  PT Short Term Goal 1 (Week 1): pt will perform bed mobility with CGA overall PT Short Term Goal 2 (Week 1): pt will transfer bed<>chair with LRAD and CGA PT Short Term Goal 3 (Week 1): pt will perform sit<>stand with LRAD and mod A  Skilled Therapeutic Interventions/Progress Updates:     Pt seated in WC receiving medications from nursing upon arrival. Pt reports 7/10 pain in R shoulder though declined need for pain medication. Rest breaks and repositioning provided throughout session. Agreeable to therapy. Session emphasized functional strengthening, endurance/activity tolerance, transfers, and dynamic seated balance. Pt transported dependent in [facility] to main gym for time/pain management. Pt transferred via slide board WC to EOM (slightly uphill) with CGA/VC. Pt attempted seated balance activity with playground ball toss at rebounder, however, too painful/difficult due to R shoulder limitations. Pt performed seated ball toss in lap with CGA for trunk control due to posterior LOB at times - pt also cued to use abdominals to help correct. Hot pack placed on R shoulder after activity for pain relief. Pt performed the following seated B LE strengthening exercises with 2# weight: 3x10 L heel raises, 3x10 B LAQ (with DF on L LE), 3x10 hip flexion/march. Pt participated in seated balance activity volleying balloon with PT using L UE - same assist required as ball toss activity. Pt transferred back to [facility] via slide board with min A due ot fatigue. Pt transported dependent in [facility] to ortho gym for amputee group - left in care of rehab tech.  Therapy Documentation Precautions:  Precautions Precautions: Fall Recall of Precautions/Restrictions: Intact Precaution/Restrictions  Comments: acute compression deformities T6-T7; h/o R BKA with wounds on distal aspect of limb Restrictions Weight Bearing Restrictions Per Provider Order: No RLE Weight Bearing Per Provider Order: Non weight bearing  Therapy/Group: Individual Therapy  [name], PT, DPT 07/01/2024, 7:35 AM

## 2024-07-01 NOTE — Progress Notes (Signed)
 Occupational Therapy Session Note  Patient Details  Name: Levi [name] MRN: [id number] Date of Birth: 1946/05/04  Today's Date: 07/01/2024 OT Individual Time: 8692-8582 OT Individual Time Calculation (min): 70 min    Short Term Goals: Week 1:  OT Short Term Goal 1 (Week 1): Pt will complete LB dressing with MOD A using AD PRN OT Short Term Goal 2 (Week 1): Pt will complete toileting with MOD A using AD/LRAD PRN OT Short Term Goal 3 (Week 1): Pt will maintain dynamic sitting balance during ADL with CGA OT Short Term Goal 4 (Week 1): Pt will complete toilet transfer with MIN A  Skilled Therapeutic Interventions/Progress Updates:  Pt greeted seated in w/c, pt agreeable to OT intervention.      Transfers/bed mobility/functional mobility:  Pt completed slide board transfers from w/c>EOB and w/c<>EOM. Pt able to place board independently but required MIN A to scoot towards L side. Max cues provided on head/hips relationship and importance of anterior weight shifting. Pt seemed to be limited by R shoulder pain this session impacting his ability to push through BUEs during transfer.   Therapeutic activity:  Worked on lateral scoots from edge of mat with pt instructed to scoot R and to place rings on peg board, pt able to scoot to R side with no physical assist needed however when scooting to L side pt required MIN A and max cues on head/hips relationship.   Worked on lateral leans from edge of mat for LB dressing with pt having to reach out of BOS and lean R and L to retrieve items. Pt completed task with CGA.    Exercises:  Pt completed core strengthening therex from edge of mat to facilitate improve core strength and stability for higher level ADL tasks. Pt completed modified crunches from edge of mat with pt instructed to sit back on wedge and then use rectus abdominus to return to midline position. Pt completed task with CGA.   Graded up task with pt instructed to complete same modified  crunches + tossing ball with therapist to further facilitate intrinsic control through core/trunk.                  Ended session with pt supine in bed with all needs within reach.  Therapy Documentation Precautions:  Precautions Precautions: Fall Recall of Precautions/Restrictions: Intact Precaution/Restrictions Comments: acute compression deformities T6-T7; h/o R BKA with wounds on distal aspect of limb Restrictions Weight Bearing Restrictions Per Provider Order: No RLE Weight Bearing Per Provider Order: Non weight bearing  Pain: R shoulder pain 6/10, rest breaks provided as needed.   Therapy/Group: Individual Therapy  Ronal Mallie Needy 07/01/2024, 2:19 PM

## 2024-07-01 NOTE — Progress Notes (Signed)
 Occupational Therapy Weekly Progress Note  Patient Details  Name: Levi [name] MRN: [id number] Date of Birth: 08-Apr-1946  Beginning of progress report period: June 24, 2024 End of progress report period: July 01, 2024  Today's Date: 07/01/2024  Patient has met 4 of 4 short term goals.  Pt making appropriate progress this reporting period. pt currently requires MIN A for slide board transfers, set- up for UB ADLs and MODA for LB ADLs. DC plan updated to SNF, awaiting SNF placement.   Patient continues to demonstrate the following deficits: {impairments:3041632} and therefore will continue to benefit from skilled OT intervention to enhance overall performance with {ADL/iADL:3041649}.  Patient {LTG progression:3041653}.  {plan of rjmz:6958345}  OT Short Term Goals Week 1:  OT Short Term Goal 1 (Week 1): Pt will complete LB dressing with MOD A using AD PRN OT Short Term Goal 1 - Progress (Week 1): Met OT Short Term Goal 2 (Week 1): Pt will complete toileting with MOD A using AD/LRAD PRN OT Short Term Goal 2 - Progress (Week 1): Met OT Short Term Goal 3 (Week 1): Pt will maintain dynamic sitting balance during ADL with CGA OT Short Term Goal 3 - Progress (Week 1): Met OT Short Term Goal 4 (Week 1): Pt will complete toilet transfer with MIN A OT Short Term Goal 4 - Progress (Week 1): Met Week 2:  OT Short Term Goal 1 (Week 2): STG=LTG    Therapy Documentation Precautions:  Precautions Precautions: Fall Recall of Precautions/Restrictions: Intact Precaution/Restrictions Comments: acute compression deformities T6-T7; h/o R BKA with wounds on distal aspect of limb Restrictions Weight Bearing Restrictions Per Provider Order: No RLE Weight Bearing Per Provider Order: Non weight bearing    Therapy/Group: Individual Therapy  Ronal Mallie Needy 07/01/2024, 2:29 PM

## 2024-07-01 NOTE — Progress Notes (Signed)
 Physical Therapy Session Note  Patient Details  Name: Levi [name] MRN: [id number] Date of Birth: 29-Sep-1945  Today's Date: 07/01/2024 PT Individual Time: 0830-0940 PT Individual Time Calculation (min): 70 min   Short Term Goals: Week 1:  PT Short Term Goal 1 (Week 1): pt will perform bed mobility with CGA overall PT Short Term Goal 2 (Week 1): pt will transfer bed<>chair with LRAD and CGA PT Short Term Goal 3 (Week 1): pt will perform sit<>stand with LRAD and mod A  Skilled Therapeutic Interventions/Progress Updates:   Received pt sitting in WC, pt agreeable to PT treatment, and reported pain in L shoulder from RTC (unrated and declined pain medication). Session with emphasis on functional mobility/transfers, generalized strengthening and endurance, and simulated car transfers. Pt transported to/from room in [facility] dependently for time management and energy conservation purposes. Pt performed simulated car transfer via slideboard uphill with mod A overall with assist for RLE management in/out of car. Pt reports both his and his wife's car is higher and more difficult to slide in/out of. Inflated Roho cushion due to lack of air. Pt performed seated RLE strengthening on Kinetron at 20 cm/sec for 1 minute x 4 trials with therapist providing manual counter resistance with emphasis on glute/quad strength.   MD arrived for morning rounds, then pt performed slideboard transfer on/off mat with CGA and assist to place/remove board. Pt performed the following exercises with emphasis on core strength: -shoulder extensions into physioball 2x10 with 5 second hold -lateral trunk rotations with 1lb medicine ball 2x10  Pt reported increasing neck pain - instructed pt in upper trapezius stretch x20 seconds bilaterally and posterior shoulder rolls x20 and provided pt with heat pack for neck. Returned to room and discussed coming to amputee group at 11am after PT and pt in agreement. Concluded session with pt sitting  in [facility] with all needs within reach.   Therapy Documentation Precautions:  Precautions Precautions: Fall Recall of Precautions/Restrictions: Intact Precaution/Restrictions Comments: acute compression deformities T6-T7; h/o R BKA with wounds on distal aspect of limb Restrictions Weight Bearing Restrictions Per Provider Order: No RLE Weight Bearing Per Provider Order: Non weight bearing  Therapy/Group: Individual Therapy Therisa HERO Zaunegger Therisa Stains PT, DPT 07/01/2024, 7:09 AM

## 2024-07-01 NOTE — Group Note (Signed)
 Patient Details Name: Levi [name] MRN: [id number] DOB: 02/21/1946 Today's Date: 07/01/2024  Time Calculation: PT Group Time Calculation PT Group Start Time: 1100 PT Group Stop Time: 1200 PT Group Time Calculation (min): 60 min    Group Description: Amputee: Patient participated in limb loss group in social setting with emphasis on stress management and coping strategies to manage new diagnosis to allow for improved mental health to improve overall quality of life. Provided active listening, emotional support and therapeutic use of self. Education provided on prosthetic fitting timeline as well as pain management strategies including desensitization techniques and mirror therapy. Issued and reviewed handouts on balanced amputee wellness and educational communities, amputee support group of the triad , contracture prevention, limb protector, and shrinker wear/care guidelines. Also discussed activity modification in relation to patient's goals and hobbies. Patient actively engaged throughout and appreciative of group session.  Individual level documentation: Pt transported back to room by PT, pt left sitting in [facility] with all needs within reach and WC at bedside   Therisa HERO Zaunegger Raha Tennison Zaunegger PT, DPT 07/01/2024, 12:17 PM

## 2024-07-01 NOTE — Progress Notes (Signed)
 Physical Therapy Weekly Progress Note  Patient Details  Name: Levi [name] MRN: [id number] Date of Birth: May 12, 1946  Beginning of progress report period: June 24, 2024 End of progress report period: July 01, 2024  Patient has met 1 of 3 short term goals and making good but slow progress toward transfer goal. Third STG (sit to stand) and all standing/amb LTGs discontinued due to weakness and current inability to wear prosthesis due to reduce skin integrity on residual limb. Pt currently min A for slide board transfers overall, though requires more so mod A when moving uphill - pt requires assist placing board. Pt currently at a CGA/S level for bed mobility. Limitations to progress include R shoulder limitations. [name] (primary caregiver) recently broke her wrist and might require surgery - discussion of possible SNF placement if she is unable to assist pt at DC. If pt does DC home, he might benefit from specialty [facility] consult as manual WC propulsion limited by R shoulder.  Patient continues to demonstrate the following deficits muscle weakness and muscle joint tightness, decreased cardiorespiratoy endurance, and decreased sitting balance, decreased postural control, and decreased balance strategies and therefore will continue to benefit from skilled PT intervention to increase functional independence with mobility.  Patient progressing toward long term goals..  Continue plan of care.  PT Short Term Goals Week 1:  PT Short Term Goal 1 (Week 1): pt will perform bed mobility with CGA overall PT Short Term Goal 1 - Progress (Week 1): Met PT Short Term Goal 2 (Week 1): pt will transfer bed<>chair with LRAD and CGA PT Short Term Goal 2 - Progress (Week 1): Progressing toward goal PT Short Term Goal 3 (Week 1): pt will perform sit<>stand with LRAD and mod A PT Short Term Goal 3 - Progress (Week 1): Discontinued (comment) (DC'd standing and amb LTGs due to weakness and inability to wear  prosthesis at this time) Week 2:  PT Short Term Goal 1 (Week 2): STG=LTG due to ELOS  Skilled Therapeutic Interventions/Progress Updates:      Therapy Documentation Precautions:  Precautions Precautions: Fall Recall of Precautions/Restrictions: Intact Precaution/Restrictions Comments: acute compression deformities T6-T7; h/o R BKA with wounds on distal aspect of limb Restrictions Weight Bearing Restrictions Per Provider Order: No RLE Weight Bearing Per Provider Order: Non weight bearing  Therapy/Group: Individual Therapy  [name], PT, DPT 07/01/2024, 7:35 AM

## 2024-07-01 NOTE — Progress Notes (Signed)
 "                                                        PROGRESS NOTE   Subjective/Complaints:  No new complaints or concerns this AM. Breathing feels at baseline overall.   ROS: as per HPI. Denies CP,  abd pain, N/V/D/C, or any other complaints at this time.    Chronic right shoulder pain-unchanged +dizziness-improved +SOB- with activity, stable   Objective:   DG CHEST PORT 1 VIEW Result Date: 06/30/2024 EXAM: 1 VIEW(S) XRAY OF THE CHEST 06/30/2024 09:30:00 AM COMPARISON: 06/29/2024 CLINICAL HISTORY: Shortness of breath. FINDINGS: LUNGS AND PLEURA: Low lung volumes. Bibasilar patchy opacities. Possible trace left pleural effusion. No pneumothorax. HEART AND MEDIASTINUM: No acute abnormality of the cardiac and mediastinal silhouettes. BONES AND SOFT TISSUES: Partially imaged left shoulder reverse arthroplasty. Cervical spinal fusion hardware in place. Old left rib fractures. No acute osseous abnormality. IMPRESSION: 1. Bibasilar patchy opacities, which may reflect atelectasis or infection. 2. Possible trace left pleural effusion. Electronically signed by: Norleen Boxer MD 06/30/2024 09:49 AM EST RP Workstation: [id number]   DG Chest 2 View Result Date: 06/29/2024 CLINICAL DATA:  Shortness of breath. EXAM: CHEST - 2 VIEW COMPARISON:  03/26/2025, CT 06/19/2024 FINDINGS: Lung volumes are low. Slight increase in patchy opacity at the right lung base. Left basilar atelectasis is unchanged. Continued improvement in interstitial edema. Suspected small persistent pleural effusions. No pneumothorax. IMPRESSION: 1. Slight increase in patchy opacity at the right lung base. 2. Continued improvement in interstitial edema. 3. Suspected small persistent pleural effusions. Electronically Signed   By: Andrea Gasman M.D.   On: 06/29/2024 20:35    Recent Labs    06/30/24 0519 07/01/24 0539  WBC 9.0 9.3  HGB 11.1* 11.5*  HCT 35.1* 36.7*  PLT 231 244   Recent Labs    06/30/24 0519 07/01/24 0539  NA  138 138  K 3.8 3.2*  CL 102 101  CO2 28 27  GLUCOSE 80 88  BUN 33* 27*  CREATININE 0.83 0.86  CALCIUM  9.0 9.0    Intake/Output Summary (Last 24 hours) at 07/01/2024 0835 Last data filed at 07/01/2024 0816 Gross per 24 hour  Intake 810 ml  Output 1300 ml  Net -490 ml        Physical Exam: Vital Signs Blood pressure 126/65, pulse 86, temperature 98.2 F (36.8 C), resp. rate 17, height 6' 0.99 (1.854 m), weight 69.3 kg, SpO2 95%.   General: No apparent distress, working in gym with therapy HEENT: Head is normocephalic, atraumatic, sclera anicteric, oral mucosa a little dry Neck: Supple without JVD or lymphadenopathy Heart: Reg rate and rhythm. No murmurs rubs or gallops Chest: mild rales at bases, non-labored, no wheezing, on RA Abdomen: Soft, non-tender, non-distended, bowel sounds positive. Extremities: R BKA, trace LLE edema  Psych: Pt's affect is appropriate. Pt is cooperative. Very pleasant Skin:  Right BKA with evidence of two small healed abrasions. Tibial tuberosity with pressure areas, petechiae right inner thigh. Open area (appears to have 1 cm depth) right lateral BKA site around fibular head-covered in dry dressing-- all covered with mepilex now  Dry flaky paper thin skin with multiple ecchymotic areas and healing skin tears on four limbs. Hematomas on left shin anterior and posteriorly.   Buttocks red with small open area  on top of gluteal cleft-- not reassessed today   Neuro:     Mental Status: AAOx4 Speech/Languate: Naming and repetition intact, fluent, follows simple commands CRANIAL NERVES: 2-12 grossly intact     MOTOR: RUE: 1-2/5 Deltoid, 4/5 Biceps, 4/5 Triceps,4/5 Grip LUE: 4/5 Deltoid, 4/5 Biceps, 4/5 Triceps, 4/5 Grip RLE: HF 4/5, KE 4/5 LLE: HF 4/5, KE 4/5, ADF 2-3/5, APF 3/5   SENSORY: Altered to LT in b/l UE and LLE   MSK: decreased PROM R shoulder- chronic   ROS: Patient denies fever, new vision changes, dizziness, nausea, vomiting,  diarrhea,  shortness of breath or chest pain, headache, or mood change.                 Assessment/Plan: 1. Functional deficits which require 3+ hours per day of interdisciplinary therapy in a comprehensive inpatient rehab setting. Physiatrist is providing close team supervision and 24 hour management of active medical problems listed below. Physiatrist and rehab team continue to assess barriers to discharge/monitor patient progress toward functional and medical goals  Care Tool:  Bathing    Body parts bathed by patient: Right arm, Left arm, Chest, Abdomen, Front perineal area, Face   Body parts bathed by helper: Buttocks, Right upper leg, Left upper leg, Left lower leg, Left arm     Bathing assist Assist Level: Maximal Assistance - Patient 24 - 49%     Upper Body Dressing/Undressing Upper body dressing   What is the patient wearing?: Pull over shirt    Upper body assist Assist Level: Moderate Assistance - Patient 50 - 74%    Lower Body Dressing/Undressing Lower body dressing      What is the patient wearing?: Pants, Underwear/pull up     Lower body assist Assist for lower body dressing: Maximal Assistance - Patient 25 - 49%     Toileting Toileting    Toileting assist Assist for toileting: Total Assistance - Patient < 25%     Transfers Chair/bed transfer  Transfers assist  Chair/bed transfer activity did not occur: Safety/medical concerns  Chair/bed transfer assist level: Moderate Assistance - Patient 50 - 74%     Locomotion Ambulation   Ambulation assist   Ambulation activity did not occur: Safety/medical concerns (weakness, deconditioning, condition of contralateral leg)          Walk 10 feet activity   Assist  Walk 10 feet activity did not occur: Safety/medical concerns (weakness, deconditioning, condition of contralateral leg)        Walk 50 feet activity   Assist Walk 50 feet with 2 turns activity did not occur: Safety/medical  concerns (weakness, deconditioning, condition of contralateral leg)         Walk 150 feet activity   Assist Walk 150 feet activity did not occur: Safety/medical concerns (weakness, deconditioning, condition of contralateral leg)         Walk 10 feet on uneven surface  activity   Assist Walk 10 feet on uneven surfaces activity did not occur: Safety/medical concerns (weakness, deconditioning, condition of contralateral leg)         Wheelchair     Assist Is the patient using a wheelchair?: Yes Type of Wheelchair: Manual Wheelchair activity did not occur: Safety/medical concerns (weakness, deconditioning)    Max wheelchair distance: 67'    Wheelchair 50 feet with 2 turns activity    Assist    Wheelchair 50 feet with 2 turns activity did not occur: Safety/medical concerns (weakness, deconditioning)   Assist Level: Supervision/Verbal cueing  Wheelchair 150 feet activity     Assist  Wheelchair 150 feet activity did not occur: Safety/medical concerns (weakness, deconditioning)   Assist Level: Maximal Assistance - Patient 25 - 49%   Blood pressure 126/65, pulse 86, temperature 98.2 F (36.8 C), resp. rate 17, height 6' 0.99 (1.854 m), weight 69.3 kg, SpO2 95%.   Medical Problem List and Plan: 1. Functional deficits secondary to Debility due to CHF with hx of R BKA             -patient may shower             -ELOS/Goals: 10-14 days, min A PT/OT             -Continue CIR  -Pt wife broke wrist-may cause difficulty for her to care for him after DC  -Expected Dc 07/08/24  2.  Antithrombotics: -DVT/anticoagulation:  Pharmaceutical: Eliquis  5mg  BID             -antiplatelet therapy: N/A 3. Pain Management: Tylenol  prn for mild and oxycodone  prn for severe pain.  Chronic R shoulder injury.  4. Mood/Behavior/Sleep: LCSW to follow for evaluation and support.              -antipsychotic agents: N/A  -Trazodone  PRN 5. Neuropsych/cognition: This patient is  capable of making decisions on his own behalf. 6. Skin/Wound Care: Routine pressure relief 7. Fluids/Electrolytes/Nutrition: Monitor I/O. Monitor labs. Continue vitamins             --[employer] added for  nutritional supplement/wound healing.  8. Acute on chronic diastolic CHF: Strict I/O with daily weights.  -on Losartan  (held?), Spironolactone  12.5mg  daily, lasix  40mg  daily and Lipitor 20mg  daily.   -06/26/24 no signs of volume overload noted, wt stable; of note, Lasix  d/c'd as of yesterday d/t Cr, but Cr today improved at 0.6. Will restart lasix .  -2/2 decrease lasix  to 20mg  starting tomorrow -2/3-4 Wt appears stable, discussed lasix  at lower dose. -CXR not indicating significant fluid overload -Dont think weights are accurate   Filed Weights   06/28/24 0438 06/30/24 0703 07/01/24 0511  Weight: 81.4 kg 73.7 kg 69.3 kg    9. Hypokalemia: Monitor for recurrence with Lasix  on board. K+ 4.3 on 1/28  -1/29 K+ 3.6 monitor  -06/26/24 K 3.3, will replete with 40mEq x1 dose, recheck Monday  -2/2 K+ 3.5, will give 20meq x1 for suboptimal level  -2/5 x2 today, 10meq daily starting tomorrow  10.  PAF: Monitor HR TID--on diltiazem  180mg  daily and Apixaban .  -HR stable 11.  Asthma/COPD: Continue chronic steroids--prednisone  15 mg with MDI. Tesslon perles 100mg  TID and Robitussin 10ml TID.  -On Bactrim  while on prednisone , reviewed with pharmacy- appears was on daily not BID, will adjust to this frequency.  12. GERD: Managed on Protonix  40mg  BID and acidophilus 13.  Hx of Seizures: Controlled on Lamotrigine  25mg  daily 14. BPH: Continue Flomax  0.4mg  nightly. Continent. 15. Neuropathy: On Lyrica  75mg  daily and cymbalta  30mg  3x/wk.  16. Thrombocytopenia: Boderline low and likely reactive. Recheck platelets in am.  --Monitor for signs of bleeding  -2/5 WNL today 17. Buttock pain: Reports severe with certain movements. EPC cream with foam dressing.  --Hx of sacral decub--Buttocks red  with small open area on top of gluteal cleft --Will order air mattress for pressure relief and to prevent breakdown.  18. Anemia of chronic disease?: Will order anemia panel. Continue Ferrous gluconate  324mg  daily  -1/29 HGB stable 11  -Ferritin normal, likely anemia of  chronic disease  -2/5 HGB up to 11.5, stable 19. HLD: continue Lipitor 20mg  daily 20. Leukocytosis-mild   -no signs/symptoms infection noted, monitor   -1/30 recheck today, no signs of symptoms of infection noted -1/30 addendum,  WBC higher at 12.1. Recheck tomorrow. Will add CXR for tomorrow. Consider U/A if not improving.  -06/26/24 stable WBC 12.1, no fever/infectious symptoms, CXR pending. Monitor for now -06/27/24 CXR neg for acute findings, no fevers overnight, monitor -2/2 improved to 10.7, monitor trend, no symptoms/signs of infection -2/5improved, WBC 9.3 today  21. Pt reports hx of B12/Vit D supplementation before admission. B12 level was 3,166.  -check D with next labs 22.  Hypotension, asymptomatic   - Ordered orthostatic vital signs-pending -1/30 addendum,  BP improved but BUN/Cr noted to be higher- hold lasix  40mg  daily-recheck tomorrow -06/26/24 improved Cr 0.6, restarting lasix  as above, BP doing well -06/27/24 BP stable, monitor with resumption of lasix  -2/2 Compression for LLE, abd binder PRN. Decrease lasix . BUN still a little elevated at 28-although improved -2/3 Monitor how he does with therapy today, reports dizziness improved this AM -2/4 Ask nursing to order abd binder, continue LE compression  -2/5 baseline BP stable, he is tolerating therapy this AM, continue current regimen and monitor     07/01/2024    5:11 AM 07/01/2024    5:10 AM 06/30/2024    9:03 PM  Vitals with BMI  Weight 152 lbs 11 oz    BMI 20.15    Systolic  126 117  Diastolic  65 83  Pulse  86 92     23. Loose stools: added metamucil 06/26/24, on acidophilus. No frank diarrhea.  24. Shortness of breath.  07/01/23 Bibasilar patchy  opacities on Xray. WBC is improved today. He is higher risk of infection given chronic prednisone . Will start augmentin  BID for 5 days for possible pneumonia.  2/5 breathing more at baseline today, continue current regimen     LOS: 8 days A FACE TO FACE EVALUATION WAS PERFORMED  Murray Collier 07/01/2024, 8:35 AM     "

## 2024-07-02 LAB — BASIC METABOLIC PANEL WITH GFR
Anion gap: 9 (ref 5–15)
BUN: 33 mg/dL — ABNORMAL HIGH (ref 8–23)
CO2: 25 mmol/L (ref 22–32)
Calcium: 9.3 mg/dL (ref 8.9–10.3)
Chloride: 103 mmol/L (ref 98–111)
Creatinine, Ser: 0.91 mg/dL (ref 0.61–1.24)
GFR, Estimated: >60 mL/min
Glucose, Bld: 81 mg/dL (ref 70–99)
Potassium: 4.5 mmol/L (ref 3.5–5.1)
Sodium: 136 mmol/L (ref 135–145)

## 2024-07-02 LAB — GLUCOSE, CAPILLARY
Glucose-Capillary: 85 mg/dL (ref 70–99)
Glucose-Capillary: 191 mg/dL — ABNORMAL HIGH (ref 70–99)

## 2024-07-02 NOTE — Progress Notes (Signed)
 Occupational Therapy Session Note  Patient Details  Name: Levi [name] MRN: [id number] Date of Birth: 05/23/1946  Today's Date: 07/02/2024 OT Individual Time: 380 [id number] 1029 OT Individual Time Calculation (min): 70 min    Short Term Goals: Week 2:  OT Short Term Goal 1 (Week 2): STG=LTG  Skilled Therapeutic Interventions/Progress Updates:   Pt greeted resting in bed, reports 5-6/10 pain in R-shoulder at rest. Moist heat and rest provided as needed for management. Pt transitions to EOB with supervision + use of bed rails. Declines ADL needs this session, perferring to wait until later OT session. Pt dependent for donning L sock/shoe. Pt performs slide-board transfers with overall CGA-Min A (emphasis placed on patient placing board). Pt dependent for WC transport from room<>main therapy gym for R-shoulder pain management. In supine, pt instructed in the exercises below for carryover into functional transfers, details below: 2 x 8-10 reps of hip extensions 2 x 8-10 reps of hip abductions (supine) 1 x 10 reps of R hip abductions (in side-lying) 1 x 8 reps of L clamshells (unable to perform standard abduction) 2 x 5 reps of single leg glute bridges  Pt requires multimodal cuing for correct posture/muscular activation. 2.5# ankle weights added for increased muscular challenge. Once back in room, pt declines wanting to change soiled brief despite education on importance of skin integrity. Time dedicated to educating patient on L-foot care and daily inspection. OT provides dependent cleaning of foot. Pt remained sitting in WC with all immediate needs met, call bell within reach, and door open.   Therapy Documentation Precautions:  Precautions Precautions: Fall Recall of Precautions/Restrictions: Intact Precaution/Restrictions Comments: acute compression deformities T6-T7; h/o R BKA with wounds on distal aspect of limb Restrictions Weight Bearing Restrictions Per Provider Order: No RLE Weight  Bearing Per Provider Order: Non weight bearing   Therapy/Group: Individual Therapy  Nereida Habermann, OTR/L, MSOT  07/02/2024, 7:37 AM

## 2024-07-02 NOTE — Progress Notes (Signed)
 Occupational Therapy Session Note  Patient Details  Name: Levi [name] MRN: [id number] Date of Birth: 1946-02-23  Today's Date: 07/02/2024 OT Individual Time: 9067-8954 OT Individual Time Calculation (min): 73 min    Short Term Goals: Week 2:  OT Short Term Goal 1 (Week 2): STG=LTG  Skilled Therapeutic Interventions/Progress Updates:  Patient agreeable to participate in OT session. Reports 0/10 pain level.   Patient participated in skilled OT session focusing on toileting, bathing, and dressing. Patient received in room ready for OT session. Patient completed bed mobility min A. Patient then completed slide board to [facility], LB doffing CGA, toileting CGA. Patient then completed slide board to wc CGA with OT placing board. Patient then CGA to slide board into shower seat. Patient completed bathing MIN A. Patient then mod A to slide board back to chair. Reported increased weakness and fatigue following shower. Patient mod A to max A d.t weakness for LB Dressing. Patient then returned to bed all needs in reach alarm on.  Therapy Documentation Precautions:  Precautions Precautions: Fall Recall of Precautions/Restrictions: Intact Precaution/Restrictions Comments: acute compression deformities T6-T7; h/o R BKA with wounds on distal aspect of limb Restrictions Weight Bearing Restrictions Per Provider Order: No RLE Weight Bearing Per Provider Order: Non weight bearing  Therapy/Group: Individual Therapy  D'mariea L Jayah Balthazar 07/02/2024, 2:37 PM

## 2024-07-02 NOTE — Progress Notes (Signed)
 "                                                        PROGRESS NOTE   Subjective/Complaints:  No new complaints or concerns this morning.  Reports he feels okay overall.  Breathing is doing a little better, it is at baseline.  Denies any recent dizziness on sitting up.  LBM yesterday  ROS: as per HPI. Denies CP,  abd pain, N/V/D/C, or any other complaints at this time.    Chronic right shoulder pain-unchanged +dizziness-improved +SOB-at baseline, with exertion   Objective:   No results found.   Recent Labs    06/30/24 0519 07/01/24 0539  WBC 9.0 9.3  HGB 11.1* 11.5*  HCT 35.1* 36.7*  PLT 231 244   Recent Labs    07/01/24 0539 07/02/24 0626  NA 138 136  K 3.2* 4.5  CL 101 103  CO2 27 25  GLUCOSE 88 81  BUN 27* 33*  CREATININE 0.86 0.91  CALCIUM  9.0 9.3    Intake/Output Summary (Last 24 hours) at 07/02/2024 1003 Last data filed at 07/02/2024 0700 Gross per 24 hour  Intake 500 ml  Output 350 ml  Net 150 ml        Physical Exam: Vital Signs Blood pressure 111/73, pulse 79, temperature (!) 97.4 F (36.3 C), temperature source Oral, resp. rate 16, height 6' 0.99 (1.854 m), weight 68.1 kg, SpO2 95%.   General: No apparent distress, beginning of therapy session with OT in his room HEENT: Head is normocephalic, atraumatic, sclera anicteric, oral mucosa a little dry Neck: Supple without JVD or lymphadenopathy Heart: Reg rate and rhythm. No murmurs rubs or gallops Chest: mild rales at bases, non-labored, no wheezing, on RA Abdomen: Soft, non-tender, non-distended, bowel sounds positive. Extremities: R BKA, no significant lower extremity edema Psych: Pt's affect is appropriate. Pt is cooperative. Very pleasant Skin:  Right BKA with evidence of two small healed abrasions. Tibial tuberosity with pressure areas, petechiae right inner thigh. Open area (appears to have 1 cm depth) right lateral BKA site around fibular head-covered in dry dressing-- all covered with  mepilex now  Dry flaky paper thin skin with multiple ecchymotic areas and healing skin tears on four limbs. Hematomas on left shin anterior and posteriorly.   Buttocks red with small open area on top of gluteal cleft-- not reassessed today   Neuro:     Mental Status: AAOx4 Speech/Languate: Naming and repetition intact, fluent, follows simple commands CRANIAL NERVES: 2-12 grossly intact     MOTOR: RUE: 1-2/5 Deltoid, 4/5 Biceps, 4/5 Triceps,4/5 Grip LUE: 4/5 Deltoid, 4/5 Biceps, 4/5 Triceps, 4/5 Grip RLE: HF 4/5, KE 4/5 LLE: HF 4/5, KE 4/5, ADF 2-3/5, APF 3/5   SENSORY: Altered to LT in b/l UE and LLE   MSK: decreased PROM R shoulder- chronic  Prior neuro assessment is c/w today's exam 07/02/2024.                  Assessment/Plan: 1. Functional deficits which require 3+ hours per day of interdisciplinary therapy in a comprehensive inpatient rehab setting. Physiatrist is providing close team supervision and 24 hour management of active medical problems listed below. Physiatrist and rehab team continue to assess barriers to discharge/monitor patient progress toward functional and medical goals  Care Tool:  Bathing  Body parts bathed by patient: Right arm, Left arm, Chest, Abdomen, Front perineal area, Face   Body parts bathed by helper: Buttocks, Right upper leg, Left upper leg, Left lower leg, Left arm     Bathing assist Assist Level: Maximal Assistance - Patient 24 - 49%     Upper Body Dressing/Undressing Upper body dressing   What is the patient wearing?: Pull over shirt    Upper body assist Assist Level: Moderate Assistance - Patient 50 - 74%    Lower Body Dressing/Undressing Lower body dressing      What is the patient wearing?: Pants, Underwear/pull up     Lower body assist Assist for lower body dressing: Maximal Assistance - Patient 25 - 49%     Toileting Toileting    Toileting assist Assist for toileting: Total Assistance - Patient <  25%     Transfers Chair/bed transfer  Transfers assist  Chair/bed transfer activity did not occur: Safety/medical concerns  Chair/bed transfer assist level: Minimal Assistance - Patient > 75%     Locomotion Ambulation   Ambulation assist   Ambulation activity did not occur: Safety/medical concerns (weakness, deconditioning, condition of contralateral leg)          Walk 10 feet activity   Assist  Walk 10 feet activity did not occur: Safety/medical concerns (weakness, deconditioning, condition of contralateral leg)        Walk 50 feet activity   Assist Walk 50 feet with 2 turns activity did not occur: Safety/medical concerns (weakness, deconditioning, condition of contralateral leg)         Walk 150 feet activity   Assist Walk 150 feet activity did not occur: Safety/medical concerns (weakness, deconditioning, condition of contralateral leg)         Walk 10 feet on uneven surface  activity   Assist Walk 10 feet on uneven surfaces activity did not occur: Safety/medical concerns (weakness, deconditioning, condition of contralateral leg)         Wheelchair     Assist Is the patient using a wheelchair?: Yes Type of Wheelchair: Manual Wheelchair activity did not occur: Safety/medical concerns (weakness, deconditioning)    Max wheelchair distance: 74'    Wheelchair 50 feet with 2 turns activity    Assist    Wheelchair 50 feet with 2 turns activity did not occur: Safety/medical concerns (weakness, deconditioning)   Assist Level: Supervision/Verbal cueing   Wheelchair 150 feet activity     Assist  Wheelchair 150 feet activity did not occur: Safety/medical concerns (weakness, deconditioning)   Assist Level: Maximal Assistance - Patient 25 - 49%   Blood pressure 111/73, pulse 79, temperature (!) 97.4 F (36.3 C), temperature source Oral, resp. rate 16, height 6' 0.99 (1.854 m), weight 68.1 kg, SpO2 95%.   Medical Problem List and  Plan: 1. Functional deficits secondary to Debility due to CHF with hx of R BKA             -patient may shower             -ELOS/Goals: 10-14 days, min A PT/OT             -Continue CIR  -Pt wife broke wrist-may cause difficulty for her to care for him after DC  -Expected Dc 07/08/24  2.  Antithrombotics: -DVT/anticoagulation:  Pharmaceutical: Eliquis  5mg  BID             -antiplatelet therapy: N/A 3. Pain Management: Tylenol  prn for mild and oxycodone  prn for severe pain.  Chronic R shoulder injury.  4. Mood/Behavior/Sleep: LCSW to follow for evaluation and support.              -antipsychotic agents: N/A  -Trazodone  PRN 5. Neuropsych/cognition: This patient is capable of making decisions on his own behalf. 6. Skin/Wound Care: Routine pressure relief 7. Fluids/Electrolytes/Nutrition: Monitor I/O. Monitor labs. Continue vitamins             --[employer] added for  nutritional supplement/wound healing.  8. Acute on chronic diastolic CHF: Strict I/O with daily weights.  -on Losartan  (held?), Spironolactone  12.5mg  daily, lasix  40mg  daily and Lipitor 20mg  daily.   -06/26/24 no signs of volume overload noted, wt stable; of note, Lasix  d/c'd as of yesterday d/t Cr, but Cr today improved at 0.6. Will restart lasix .  -2/2 decrease lasix  to 20mg  starting tomorrow -2/3-4 Wt appears stable, discussed lasix  at lower dose. -CXR not indicating significant fluid overload -Dont think weights are accurate, no signs of fluid overload noted   Filed Weights   07/01/24 0511 07/02/24 0500 07/02/24 0900  Weight: 69.3 kg 73.9 kg 68.1 kg    9. Hypokalemia: Monitor for recurrence with Lasix  on board. K+ 4.3 on 1/28  -1/29 K+ 3.6 monitor  -06/26/24 K 3.3, will replete with 40mEq x1 dose, recheck Monday  -2/2 K+ 3.5, will give 20meq x1 for suboptimal level  -2/5 x2 today, 10meq daily starting tomorrow   2/6 potassium up to 4.5 recheck Monday 10.  PAF: Monitor HR TID--on diltiazem  180mg  daily  and Apixaban .  -HR stable 11.  Asthma/COPD: Continue chronic steroids--prednisone  15 mg with MDI. Tesslon perles 100mg  TID and Robitussin 10ml TID.  -On Bactrim  while on prednisone , reviewed with pharmacy- appears was on daily not BID, will adjust to this frequency.  12. GERD: Managed on Protonix  40mg  BID and acidophilus 13.  Hx of Seizures: Controlled on Lamotrigine  25mg  daily 14. BPH: Continue Flomax  0.4mg  nightly. Continent. 15. Neuropathy: On Lyrica  75mg  daily and cymbalta  30mg  3x/wk.  16. Thrombocytopenia: Boderline low and likely reactive. Recheck platelets in am.  --Monitor for signs of bleeding  -2/5 WNL today 17. Buttock pain: Reports severe with certain movements. EPC cream with foam dressing.  --Hx of sacral decub--Buttocks red with small open area on top of gluteal cleft --Will order air mattress for pressure relief and to prevent breakdown.  18. Anemia of chronic disease?: Will order anemia panel. Continue Ferrous gluconate  324mg  daily  -1/29 HGB stable 11  -Ferritin normal, likely anemia of chronic disease  -2/5 HGB up to 11.5, stable 19. HLD: continue Lipitor 20mg  daily 20. Leukocytosis-mild   -no signs/symptoms infection noted, monitor   -1/30 recheck today, no signs of symptoms of infection noted -1/30 addendum,  WBC higher at 12.1. Recheck tomorrow. Will add CXR for tomorrow. Consider U/A if not improving.  -06/26/24 stable WBC 12.1, no fever/infectious symptoms, CXR pending. Monitor for now -06/27/24 CXR neg for acute findings, no fevers overnight, monitor -2/2 improved to 10.7, monitor trend, no symptoms/signs of infection -2/5improved, WBC 9.3 today Recheck Monday  21. Pt reports hx of B12/Vit D supplementation before admission. B12 level was 3,166.  -check D with next labs 22.  Hypotension, asymptomatic   - Ordered orthostatic vital signs-pending -1/30 addendum,  BP improved but BUN/Cr noted to be higher- hold lasix  40mg  daily-recheck tomorrow -06/26/24 improved Cr  0.6, restarting lasix  as above, BP doing well -06/27/24 BP stable, monitor with resumption of lasix  -2/2 Compression for LLE, abd binder PRN. Decrease  lasix . BUN still a little elevated at 28-although improved -2/3 Monitor how he does with therapy today, reports dizziness improved this AM -2/4 Ask nursing to order abd binder, continue LE compression  -2/6 BP stable, denies recent dizziness.  Continue current regimen     07/02/2024    9:00 AM 07/02/2024    5:20 AM 07/02/2024    5:00 AM  Vitals with BMI  Weight 150 lbs 2 oz  163 lbs  BMI 19.81  21.51  Systolic  111 111  Diastolic  73 73  Pulse   79     23. Loose stools: added metamucil 06/26/24, on acidophilus. No frank diarrhea.  - 2/6 LBM yesterday large type 5  24. Shortness of breath.  07/01/23 Bibasilar patchy opacities on Xray. WBC is improved today. He is higher risk of infection given chronic prednisone . Will start augmentin  BID for 5 days for possible pneumonia.  2/6 improved, patient reports breathing is overall at baseline     LOS: 9 days A FACE TO FACE EVALUATION WAS PERFORMED  Murray Collier 07/02/2024, 10:03 AM     "

## 2024-07-02 NOTE — Progress Notes (Signed)
 Physical Therapy Session Note  Patient Details  Name: Levi [name] MRN: [id number] Date of Birth: [birth year]  Today's Date: 07/02/2024 PT Individual Time: 1301-1415 PT Individual Time Calculation (min): 74 min   Short Term Goals: Week 2:  PT Short Term Goal 1 (Week 2): STG=LTG due to ELOS  Skilled Therapeutic Interventions/Progress Updates: Pt presents supine in bed and agreeable to therapy.  Pt continent of bladder in urinal x 150 ml, charted in Flowsheets.  Pt transfers sup to sit w/ supervision, but > 1 attempt before completion.  Pt requires mod A to place SB but CGA for transfer bed > w/c, cues for forward lean and occasional blocking L foot.  Pt wheeled x 55' w/ BUE and LLE but c/o R shoulder pain.  Pt performed SB transfer w/c > mat table and CGA.  Hot pack applied to R shoulder w/ seated TE.  Pt performed seated LAQ, abd/add 3 x 15 w/o arm/trunk support for increased core.  Pt performed 3 x 10-15 modified ab crunches from [employer].  Pt transferred to supine w/ supervision.  Pt performed 3 x 10 hip/knee flexion.  Pt performed SB transfer mat > w/c w/ CGA, but able to place SB w/ only CGA/min on flat surface of mat.  Pt wheeled to room and performed SB transfer w/ min A w/c > bed 2/2 uphill bias.  Pt transfers sit to supine w/ supervision and then min A to scoot to [facility] on flat bed using side rails.  All needs in reach.     Therapy Documentation Precautions:  Precautions Precautions: Fall Recall of Precautions/Restrictions: Intact Precaution/Restrictions Comments: acute compression deformities T6-T7; h/o R BKA with wounds on distal aspect of limb Restrictions Weight Bearing Restrictions Per Provider Order: No RLE Weight Bearing Per Provider Order: Non weight bearing General:   Vital Signs:   Pain:8/10 Pain Assessment Pain Scale: 0-10 Pain Score: 0-No pain   Therapy/Group: Individual Therapy  [name] P [name] 07/02/2024, 2:17 PM

## 2024-07-15 ENCOUNTER — Ambulatory Visit: Admitting: Physical Medicine & Rehabilitation

## 2024-07-28 ENCOUNTER — Ambulatory Visit: Payer: Self-pay | Admitting: Internal Medicine

## 2024-08-13 ENCOUNTER — Ambulatory Visit: Admitting: Orthopedic Surgery

## 2024-10-26 ENCOUNTER — Ambulatory Visit

## 2025-03-21 ENCOUNTER — Ambulatory Visit: Admitting: Family Medicine
